# Patient Record
Sex: Male | Born: 1970
Health system: Southern US, Community
[De-identification: ages and names within clinical notes are randomized; demographics above are authoritative.]

## PROBLEM LIST (undated history)

## (undated) DIAGNOSIS — Z9289 Personal history of other medical treatment: Secondary | ICD-10-CM

## (undated) DIAGNOSIS — M1990 Unspecified osteoarthritis, unspecified site: Secondary | ICD-10-CM

## (undated) DIAGNOSIS — F32A Depression, unspecified: Secondary | ICD-10-CM

## (undated) DIAGNOSIS — I10 Essential (primary) hypertension: Secondary | ICD-10-CM

## (undated) DIAGNOSIS — R0600 Dyspnea, unspecified: Secondary | ICD-10-CM

## (undated) DIAGNOSIS — F419 Anxiety disorder, unspecified: Secondary | ICD-10-CM

## (undated) DIAGNOSIS — K219 Gastro-esophageal reflux disease without esophagitis: Secondary | ICD-10-CM

## (undated) DIAGNOSIS — I739 Peripheral vascular disease, unspecified: Secondary | ICD-10-CM

## (undated) DIAGNOSIS — I2510 Atherosclerotic heart disease of native coronary artery without angina pectoris: Secondary | ICD-10-CM

## (undated) DIAGNOSIS — T7840XA Allergy, unspecified, initial encounter: Secondary | ICD-10-CM

## (undated) DIAGNOSIS — G4730 Sleep apnea, unspecified: Secondary | ICD-10-CM

## (undated) DIAGNOSIS — I509 Heart failure, unspecified: Secondary | ICD-10-CM

## (undated) DIAGNOSIS — D649 Anemia, unspecified: Secondary | ICD-10-CM

## (undated) DIAGNOSIS — T8859XA Other complications of anesthesia, initial encounter: Secondary | ICD-10-CM

## (undated) DIAGNOSIS — N189 Chronic kidney disease, unspecified: Secondary | ICD-10-CM

## (undated) DIAGNOSIS — R112 Nausea with vomiting, unspecified: Secondary | ICD-10-CM

## (undated) DIAGNOSIS — F329 Major depressive disorder, single episode, unspecified: Secondary | ICD-10-CM

## (undated) HISTORY — DX: Allergy, unspecified, initial encounter: T78.40XA

## (undated) HISTORY — PX: COLONOSCOPY: SHX174

## (undated) HISTORY — DX: Depression, unspecified: F32.A

## (undated) HISTORY — DX: Major depressive disorder, single episode, unspecified: F32.9

## (undated) HISTORY — DX: Anemia, unspecified: D64.9

## (undated) HISTORY — DX: Anxiety disorder, unspecified: F41.9

## (undated) HISTORY — DX: Sleep apnea, unspecified: G47.30

## (undated) HISTORY — DX: Gastro-esophageal reflux disease without esophagitis: K21.9

## (undated) HISTORY — DX: Chronic kidney disease, unspecified: N18.9

---

## 1999-04-14 ENCOUNTER — Emergency Department (HOSPITAL_COMMUNITY): Admission: EM | Admit: 1999-04-14 | Discharge: 1999-04-14 | Payer: Self-pay | Admitting: Emergency Medicine

## 1999-04-14 ENCOUNTER — Encounter: Payer: Self-pay | Admitting: *Deleted

## 2001-12-02 ENCOUNTER — Ambulatory Visit (HOSPITAL_BASED_OUTPATIENT_CLINIC_OR_DEPARTMENT_OTHER): Admission: RE | Admit: 2001-12-02 | Discharge: 2001-12-02 | Payer: Self-pay

## 2002-06-01 ENCOUNTER — Encounter: Admission: RE | Admit: 2002-06-01 | Discharge: 2002-08-30 | Payer: Self-pay | Admitting: Family Medicine

## 2003-07-04 ENCOUNTER — Encounter: Admission: RE | Admit: 2003-07-04 | Discharge: 2003-10-02 | Payer: Self-pay | Admitting: Family Medicine

## 2009-06-19 ENCOUNTER — Emergency Department (HOSPITAL_COMMUNITY): Admission: EM | Admit: 2009-06-19 | Discharge: 2009-06-19 | Payer: Self-pay | Admitting: Emergency Medicine

## 2009-08-22 ENCOUNTER — Emergency Department (HOSPITAL_COMMUNITY): Admission: EM | Admit: 2009-08-22 | Discharge: 2009-08-22 | Payer: Self-pay | Admitting: Emergency Medicine

## 2010-11-04 LAB — GLUCOSE, CAPILLARY: Glucose-Capillary: 174 mg/dL — ABNORMAL HIGH (ref 70–99)

## 2011-03-03 ENCOUNTER — Inpatient Hospital Stay (INDEPENDENT_AMBULATORY_CARE_PROVIDER_SITE_OTHER)
Admission: RE | Admit: 2011-03-03 | Discharge: 2011-03-03 | Disposition: A | Discharge status: Home / Self Care | Payer: 59 | Source: Ambulatory Visit | Attending: Family Medicine | Admitting: Family Medicine

## 2011-03-03 DIAGNOSIS — L0211 Cutaneous abscess of neck: Secondary | ICD-10-CM

## 2011-03-05 ENCOUNTER — Inpatient Hospital Stay (INDEPENDENT_AMBULATORY_CARE_PROVIDER_SITE_OTHER)
Admission: RE | Admit: 2011-03-05 | Discharge: 2011-03-05 | Disposition: A | Discharge status: Home / Self Care | Payer: 59 | Source: Ambulatory Visit | Attending: Family Medicine | Admitting: Family Medicine

## 2011-03-05 DIAGNOSIS — L0211 Cutaneous abscess of neck: Secondary | ICD-10-CM

## 2011-03-06 LAB — CULTURE, ROUTINE-ABSCESS: Gram Stain: NONE SEEN

## 2011-05-17 ENCOUNTER — Other Ambulatory Visit: Payer: Self-pay | Admitting: Family Medicine

## 2011-05-17 ENCOUNTER — Ambulatory Visit
Admission: RE | Admit: 2011-05-17 | Discharge: 2011-05-17 | Disposition: A | Discharge status: Home / Self Care | Payer: 59 | Source: Ambulatory Visit | Attending: Family Medicine | Admitting: Family Medicine

## 2011-05-17 DIAGNOSIS — M25569 Pain in unspecified knee: Secondary | ICD-10-CM

## 2011-05-17 DIAGNOSIS — Z87891 Personal history of nicotine dependence: Secondary | ICD-10-CM

## 2011-08-12 ENCOUNTER — Emergency Department (HOSPITAL_COMMUNITY): Payer: 59 | Admitting: Anesthesiology

## 2011-08-12 ENCOUNTER — Emergency Department (HOSPITAL_COMMUNITY): Payer: 59

## 2011-08-12 ENCOUNTER — Encounter (HOSPITAL_COMMUNITY): Payer: Self-pay | Admitting: Anesthesiology

## 2011-08-12 ENCOUNTER — Encounter (HOSPITAL_COMMUNITY): Payer: Self-pay | Admitting: *Deleted

## 2011-08-12 ENCOUNTER — Encounter: Payer: Self-pay | Admitting: Emergency Medicine

## 2011-08-12 ENCOUNTER — Inpatient Hospital Stay (HOSPITAL_COMMUNITY): Payer: 59

## 2011-08-12 ENCOUNTER — Other Ambulatory Visit: Payer: Self-pay | Admitting: Urology

## 2011-08-12 ENCOUNTER — Inpatient Hospital Stay (HOSPITAL_COMMUNITY)
Admission: EM | Admit: 2011-08-12 | Discharge: 2011-08-20 | DRG: 729 | Disposition: A | Discharge status: Home Health | Payer: 59 | Attending: Family Medicine | Admitting: Family Medicine

## 2011-08-12 ENCOUNTER — Emergency Department (HOSPITAL_COMMUNITY)
Admission: EM | Admit: 2011-08-12 | Discharge: 2011-08-12 | Disposition: A | Discharge status: Nursing Home (Includes ICF) | Payer: 59 | Source: Home / Self Care

## 2011-08-12 ENCOUNTER — Encounter (HOSPITAL_COMMUNITY)
Admission: EM | Disposition: A | Discharge status: Home Health | Payer: Self-pay | Source: Home / Self Care | Attending: Pulmonary Disease

## 2011-08-12 DIAGNOSIS — D72829 Elevated white blood cell count, unspecified: Secondary | ICD-10-CM | POA: Diagnosis present

## 2011-08-12 DIAGNOSIS — I10 Essential (primary) hypertension: Secondary | ICD-10-CM | POA: Diagnosis present

## 2011-08-12 DIAGNOSIS — E119 Type 2 diabetes mellitus without complications: Secondary | ICD-10-CM | POA: Diagnosis present

## 2011-08-12 DIAGNOSIS — N492 Inflammatory disorders of scrotum: Secondary | ICD-10-CM

## 2011-08-12 DIAGNOSIS — M726 Necrotizing fasciitis: Secondary | ICD-10-CM

## 2011-08-12 DIAGNOSIS — R197 Diarrhea, unspecified: Secondary | ICD-10-CM | POA: Diagnosis present

## 2011-08-12 DIAGNOSIS — J9600 Acute respiratory failure, unspecified whether with hypoxia or hypercapnia: Secondary | ICD-10-CM | POA: Diagnosis present

## 2011-08-12 DIAGNOSIS — N179 Acute kidney failure, unspecified: Secondary | ICD-10-CM | POA: Diagnosis present

## 2011-08-12 DIAGNOSIS — N501 Vascular disorders of male genital organs: Principal | ICD-10-CM | POA: Diagnosis present

## 2011-08-12 DIAGNOSIS — N498 Inflammatory disorders of other specified male genital organs: Secondary | ICD-10-CM | POA: Diagnosis present

## 2011-08-12 DIAGNOSIS — R110 Nausea: Secondary | ICD-10-CM | POA: Diagnosis present

## 2011-08-12 DIAGNOSIS — N493 Fournier gangrene: Secondary | ICD-10-CM | POA: Diagnosis present

## 2011-08-12 DIAGNOSIS — A419 Sepsis, unspecified organism: Secondary | ICD-10-CM | POA: Diagnosis present

## 2011-08-12 DIAGNOSIS — L02219 Cutaneous abscess of trunk, unspecified: Secondary | ICD-10-CM | POA: Diagnosis present

## 2011-08-12 DIAGNOSIS — F17200 Nicotine dependence, unspecified, uncomplicated: Secondary | ICD-10-CM | POA: Diagnosis present

## 2011-08-12 DIAGNOSIS — R6520 Severe sepsis without septic shock: Secondary | ICD-10-CM | POA: Diagnosis present

## 2011-08-12 DIAGNOSIS — T3695XA Adverse effect of unspecified systemic antibiotic, initial encounter: Secondary | ICD-10-CM | POA: Diagnosis present

## 2011-08-12 HISTORY — DX: Essential (primary) hypertension: I10

## 2011-08-12 HISTORY — PX: IRRIGATION AND DEBRIDEMENT ABSCESS: SHX5252

## 2011-08-12 HISTORY — PX: INCISION AND DRAINAGE OF WOUND: SHX1803

## 2011-08-12 LAB — POCT I-STAT 3, ART BLOOD GAS (G3+)
Acid-base deficit: 4 mmol/L — ABNORMAL HIGH (ref 0.0–2.0)
Bicarbonate: 20.4 mEq/L (ref 20.0–24.0)
O2 Saturation: 93 %
Patient temperature: 98.4
TCO2: 21 mmol/L (ref 0–100)
pH, Arterial: 7.363 (ref 7.350–7.450)

## 2011-08-12 LAB — URINALYSIS, ROUTINE W REFLEX MICROSCOPIC
Glucose, UA: >1000 mg/dL — AB
Hgb urine dipstick: NEGATIVE
Leukocytes, UA: NEGATIVE
Specific Gravity, Urine: 1.042 — ABNORMAL HIGH (ref 1.005–1.030)
pH: 5.5 (ref 5.0–8.0)

## 2011-08-12 LAB — GLUCOSE, CAPILLARY
Glucose-Capillary: 371 mg/dL — ABNORMAL HIGH (ref 70–99)
Glucose-Capillary: 256 mg/dL — ABNORMAL HIGH (ref 70–99)
Glucose-Capillary: 262 mg/dL — ABNORMAL HIGH (ref 70–99)
Glucose-Capillary: 248 mg/dL — ABNORMAL HIGH (ref 70–99)
Glucose-Capillary: 263 mg/dL — ABNORMAL HIGH (ref 70–99)
Glucose-Capillary: 173 mg/dL — ABNORMAL HIGH (ref 70–99)

## 2011-08-12 LAB — DIFFERENTIAL
Basophils Relative: 0 % (ref 0–1)
Eosinophils Absolute: 0 10*3/uL (ref 0.0–0.7)
Eosinophils Relative: 0 % (ref 0–5)
Lymphocytes Relative: 3 % — ABNORMAL LOW (ref 12–46)
Neutro Abs: 17.4 10*3/uL — ABNORMAL HIGH (ref 1.7–7.7)

## 2011-08-12 LAB — CBC
HCT: 43.3 % (ref 39.0–52.0)
Hemoglobin: 15.3 g/dL (ref 13.0–17.0)
MCHC: 35.3 g/dL (ref 30.0–36.0)
RBC: 5.18 MIL/uL (ref 4.22–5.81)

## 2011-08-12 LAB — BASIC METABOLIC PANEL
CO2: 20 mEq/L (ref 19–32)
Chloride: 95 mEq/L — ABNORMAL LOW (ref 96–112)
Glucose, Bld: 359 mg/dL — ABNORMAL HIGH (ref 70–99)
Potassium: 3.8 mEq/L (ref 3.5–5.1)
Sodium: 132 mEq/L — ABNORMAL LOW (ref 135–145)

## 2011-08-12 LAB — LACTIC ACID, PLASMA: Lactic Acid, Venous: 2.6 mmol/L — ABNORMAL HIGH (ref 0.5–2.2)

## 2011-08-12 LAB — APTT: aPTT: 30 seconds (ref 24–37)

## 2011-08-12 LAB — PROCALCITONIN: Procalcitonin: 4.9 ng/mL

## 2011-08-12 SURGERY — IRRIGATION AND DEBRIDEMENT ABSCESS
Anesthesia: General | Site: Perineum

## 2011-08-12 MED ORDER — SODIUM CHLORIDE 0.9 % IV SOLN
1.0000 mg/h | INTRAVENOUS | Status: DC
  Administered 2011-08-13: 2 mg/h via INTRAVENOUS
  Administered 2011-08-14: 1 mg/h via INTRAVENOUS
  Filled 2011-08-12 (×5): qty 10

## 2011-08-12 MED ORDER — INSULIN ASPART 100 UNIT/ML ~~LOC~~ SOLN
SUBCUTANEOUS | Status: DC | PRN
  Administered 2011-08-12: 7 [IU] via INTRAVENOUS

## 2011-08-12 MED ORDER — SODIUM CHLORIDE 0.9 % IR SOLN
Status: DC | PRN
  Administered 2011-08-12: 3000 mL

## 2011-08-12 MED ORDER — FENTANYL CITRATE 0.05 MG/ML IJ SOLN
INTRAMUSCULAR | Status: DC | PRN
  Administered 2011-08-12: 100 ug via INTRAVENOUS
  Administered 2011-08-12: 150 ug via INTRAVENOUS
  Administered 2011-08-12: 250 ug via INTRAVENOUS

## 2011-08-12 MED ORDER — PANTOPRAZOLE SODIUM 40 MG IV SOLR
40.0000 mg | INTRAVENOUS | Status: DC
  Administered 2011-08-12 – 2011-08-15 (×4): 40 mg via INTRAVENOUS
  Filled 2011-08-12 (×6): qty 40

## 2011-08-12 MED ORDER — PROPOFOL 10 MG/ML IV EMUL
5.0000 ug/kg/min | INTRAVENOUS | Status: DC
  Administered 2011-08-12: 50 ug/kg/min via INTRAVENOUS

## 2011-08-12 MED ORDER — INSULIN ASPART 100 UNIT/ML ~~LOC~~ SOLN
1.0000 [IU] | SUBCUTANEOUS | Status: DC | PRN
  Filled 2011-08-12: qty 3

## 2011-08-12 MED ORDER — SODIUM CHLORIDE 0.9 % IV BOLUS (SEPSIS)
500.0000 mL | Freq: Once | INTRAVENOUS | Status: AC
  Administered 2011-08-12: 500 mL via INTRAVENOUS

## 2011-08-12 MED ORDER — ONDANSETRON HCL 4 MG/2ML IJ SOLN: 4.0000 mg | Freq: Once | INTRAMUSCULAR | Status: DC | PRN

## 2011-08-12 MED ORDER — CLINDAMYCIN PHOSPHATE 900 MG/50ML IV SOLN
900.0000 mg | Freq: Three times a day (TID) | INTRAVENOUS | Status: DC
  Administered 2011-08-12 – 2011-08-20 (×22): 900 mg via INTRAVENOUS
  Filled 2011-08-12 (×25): qty 50

## 2011-08-12 MED ORDER — VANCOMYCIN HCL IN DEXTROSE 1-5 GM/200ML-% IV SOLN
1000.0000 mg | Freq: Once | INTRAVENOUS | Status: AC
  Administered 2011-08-12: 1000 mg via INTRAVENOUS
  Filled 2011-08-12: qty 200

## 2011-08-12 MED ORDER — PROPOFOL 10 MG/ML IV EMUL
INTRAVENOUS | Status: DC | PRN
  Administered 2011-08-12: 300 mg via INTRAVENOUS

## 2011-08-12 MED ORDER — PROPOFOL 10 MG/ML IV EMUL
INTRAVENOUS | Status: DC | PRN
  Administered 2011-08-12: 100 ug/kg/min via INTRAVENOUS

## 2011-08-12 MED ORDER — VANCOMYCIN HCL IN DEXTROSE 1-5 GM/200ML-% IV SOLN
1000.0000 mg | Freq: Two times a day (BID) | INTRAVENOUS | Status: DC
  Administered 2011-08-13 – 2011-08-14 (×3): 1000 mg via INTRAVENOUS
  Filled 2011-08-12 (×5): qty 200

## 2011-08-12 MED ORDER — ROCURONIUM BROMIDE 100 MG/10ML IV SOLN
INTRAVENOUS | Status: DC | PRN
  Administered 2011-08-12: 50 mg via INTRAVENOUS

## 2011-08-12 MED ORDER — MORPHINE SULFATE 10 MG/ML IJ SOLN
INTRAMUSCULAR | Status: DC | PRN
  Administered 2011-08-12: 10 mg via INTRAVENOUS

## 2011-08-12 MED ORDER — SODIUM CHLORIDE 0.9 % IR SOLN
Status: DC | PRN
  Administered 2011-08-12: 16:00:00

## 2011-08-12 MED ORDER — MORPHINE SULFATE 4 MG/ML IJ SOLN
4.0000 mg | Freq: Once | INTRAMUSCULAR | Status: AC
  Administered 2011-08-12: 4 mg via INTRAVENOUS
  Filled 2011-08-12: qty 1

## 2011-08-12 MED ORDER — PIPERACILLIN-TAZOBACTAM 3.375 G IVPB
3.3750 g | Freq: Once | INTRAVENOUS | Status: AC
  Administered 2011-08-12: 3.375 g via INTRAVENOUS
  Filled 2011-08-12: qty 50

## 2011-08-12 MED ORDER — 0.9 % SODIUM CHLORIDE (POUR BTL) OPTIME
TOPICAL | Status: DC | PRN
  Administered 2011-08-12: 1000 mL

## 2011-08-12 MED ORDER — ACETAMINOPHEN 325 MG PO TABS
650.0000 mg | ORAL_TABLET | Freq: Once | ORAL | Status: AC
  Administered 2011-08-12: 650 mg via ORAL
  Filled 2011-08-12: qty 2

## 2011-08-12 MED ORDER — HYDROMORPHONE HCL PF 1 MG/ML IJ SOLN: 0.2500 mg | INTRAMUSCULAR | Status: DC | PRN

## 2011-08-12 MED ORDER — PIPERACILLIN-TAZOBACTAM 3.375 G IVPB
3.3750 g | Freq: Three times a day (TID) | INTRAVENOUS | Status: DC
  Administered 2011-08-12 – 2011-08-16 (×11): 3.375 g via INTRAVENOUS
  Filled 2011-08-12 (×13): qty 50

## 2011-08-12 MED ORDER — CLINDAMYCIN PHOSPHATE 900 MG/50ML IV SOLN
900.0000 mg | Freq: Four times a day (QID) | INTRAVENOUS | Status: DC
  Filled 2011-08-12 (×3): qty 50

## 2011-08-12 MED ORDER — ONDANSETRON HCL 4 MG/2ML IJ SOLN
4.0000 mg | Freq: Once | INTRAMUSCULAR | Status: AC
  Administered 2011-08-12: 4 mg via INTRAVENOUS
  Filled 2011-08-12: qty 2

## 2011-08-12 MED ORDER — ACETAMINOPHEN 160 MG/5ML PO SOLN: 650.0000 mg | Freq: Four times a day (QID) | ORAL | Status: DC | PRN

## 2011-08-12 MED ORDER — PHENYLEPHRINE HCL 10 MG/ML IJ SOLN
INTRAMUSCULAR | Status: DC | PRN
  Administered 2011-08-12: 80 ug via INTRAVENOUS
  Administered 2011-08-12: 40 ug via INTRAVENOUS
  Administered 2011-08-12: 80 ug via INTRAVENOUS

## 2011-08-12 MED ORDER — SODIUM CHLORIDE 0.9 % IV SOLN
25.0000 ug/h | INTRAVENOUS | Status: DC
  Administered 2011-08-12 – 2011-08-14 (×3): 100 ug/h via INTRAVENOUS
  Filled 2011-08-12 (×6): qty 50

## 2011-08-12 MED ORDER — SODIUM CHLORIDE 0.9 % IV SOLN
100.0000 [IU] | INTRAVENOUS | Status: DC | PRN
  Administered 2011-08-12: 3 [IU]/h via INTRAVENOUS

## 2011-08-12 MED ORDER — VANCOMYCIN HCL IN DEXTROSE 1-5 GM/200ML-% IV SOLN
1000.0000 mg | Freq: Two times a day (BID) | INTRAVENOUS | Status: DC
  Administered 2011-08-12: 1000 mg via INTRAVENOUS
  Filled 2011-08-12 (×2): qty 200

## 2011-08-12 MED ORDER — PIPERACILLIN-TAZOBACTAM 3.375 G IVPB
3.3750 g | Freq: Four times a day (QID) | INTRAVENOUS | Status: DC
  Filled 2011-08-12 (×3): qty 50

## 2011-08-12 MED ORDER — ACETAMINOPHEN 325 MG PO TABS
ORAL_TABLET | ORAL | Status: AC
  Filled 2011-08-12: qty 2

## 2011-08-12 MED ORDER — MIDAZOLAM HCL 5 MG/5ML IJ SOLN
INTRAMUSCULAR | Status: DC | PRN
  Administered 2011-08-12 (×2): 2 mg via INTRAVENOUS

## 2011-08-12 MED ORDER — VECURONIUM BROMIDE 10 MG IV SOLR
INTRAVENOUS | Status: DC | PRN
  Administered 2011-08-12 (×2): 4 mg via INTRAVENOUS
  Administered 2011-08-12: 2 mg via INTRAVENOUS

## 2011-08-12 MED ORDER — CHLORHEXIDINE GLUCONATE 0.12 % MT SOLN
15.0000 mL | Freq: Two times a day (BID) | OROMUCOSAL | Status: DC
  Administered 2011-08-12 – 2011-08-15 (×6): 15 mL via OROMUCOSAL
  Filled 2011-08-12 (×6): qty 15

## 2011-08-12 MED ORDER — SODIUM CHLORIDE 0.9 % IV SOLN
INTRAVENOUS | Status: DC
  Administered 2011-08-12 – 2011-08-15 (×4): via INTRAVENOUS

## 2011-08-12 MED ORDER — PROPOFOL 10 MG/ML IV EMUL
5.0000 ug/kg/min | INTRAVENOUS | Status: DC
  Administered 2011-08-12: 40 ug/kg/min via INTRAVENOUS
  Filled 2011-08-12: qty 100

## 2011-08-12 MED ORDER — PROPOFOL 10 MG/ML IV EMUL
INTRAVENOUS | Status: AC
  Administered 2011-08-12: 18:00:00
  Filled 2011-08-12: qty 100

## 2011-08-12 MED ORDER — HETASTARCH-ELECTROLYTES 6 % IV SOLN
INTRAVENOUS | Status: DC | PRN
  Administered 2011-08-12 (×2): via INTRAVENOUS

## 2011-08-12 MED ORDER — BIOTENE DRY MOUTH MT LIQD
1.0000 "application " | Freq: Four times a day (QID) | OROMUCOSAL | Status: DC
  Administered 2011-08-13 – 2011-08-15 (×11): 15 mL via OROMUCOSAL

## 2011-08-12 MED ORDER — SODIUM CHLORIDE 0.9 % IV SOLN
INTRAVENOUS | Status: DC | PRN
  Administered 2011-08-12 (×3): via INTRAVENOUS

## 2011-08-12 MED ORDER — INSULIN REGULAR HUMAN 100 UNIT/ML IJ SOLN
INTRAMUSCULAR | Status: DC
  Administered 2011-08-12: 3 [IU]/h via INTRAVENOUS
  Filled 2011-08-12: qty 1

## 2011-08-12 MED ORDER — ACETAMINOPHEN 325 MG PO TABS
650.0000 mg | ORAL_TABLET | Freq: Once | ORAL | Status: AC
  Administered 2011-08-12: 650 mg via ORAL

## 2011-08-12 SURGICAL SUPPLY — 39 items
BAG URINE DRAINAGE (UROLOGICAL SUPPLIES) ×1 IMPLANT
BANDAGE GAUZE ELAST BULKY 4 IN (GAUZE/BANDAGES/DRESSINGS) ×2 IMPLANT
BLADE SURG 10 STRL SS (BLADE) ×1 IMPLANT
BLADE SURG 15 STRL LF DISP TIS (BLADE) IMPLANT
BLADE SURG 15 STRL SS (BLADE) ×3
CATH FOLEY 2WAY SLVR  5CC 18FR (CATHETERS) ×1
CATH FOLEY 2WAY SLVR 5CC 18FR (CATHETERS) IMPLANT
CONT SPECI 4OZ STER CLIK (MISCELLANEOUS) ×1 IMPLANT
COVER SURGICAL LIGHT HANDLE (MISCELLANEOUS) ×1 IMPLANT
DRSG PAD ABDOMINAL 8X10 ST (GAUZE/BANDAGES/DRESSINGS) ×1 IMPLANT
GAUZE KERLIX 2  STERILE LF (GAUZE/BANDAGES/DRESSINGS) ×1 IMPLANT
GAUZE VASELINE 3X9 (GAUZE/BANDAGES/DRESSINGS) ×1 IMPLANT
GLOVE BIO SURGEON STRL SZ7 (GLOVE) ×3 IMPLANT
GLOVE BIO SURGEON STRL SZ8 (GLOVE) ×1 IMPLANT
GLOVE BIOGEL PI IND STRL 6.5 (GLOVE) IMPLANT
GLOVE BIOGEL PI IND STRL 7.5 (GLOVE) IMPLANT
GLOVE BIOGEL PI IND STRL 8 (GLOVE) IMPLANT
GLOVE BIOGEL PI INDICATOR 6.5 (GLOVE) ×2
GLOVE BIOGEL PI INDICATOR 7.5 (GLOVE) ×2
GLOVE BIOGEL PI INDICATOR 8 (GLOVE) ×2
GOWN STRL NON-REIN LRG LVL3 (GOWN DISPOSABLE) ×3 IMPLANT
HANDPIECE INTERPULSE COAX TIP (DISPOSABLE) ×3
KIT BASIN OR (CUSTOM PROCEDURE TRAY) ×1 IMPLANT
MANIFOLD NEPTUNE II (INSTRUMENTS) ×1 IMPLANT
NS IRRIG 1000ML POUR BTL (IV SOLUTION) ×1 IMPLANT
PACK LITHOTOMY IV (CUSTOM PROCEDURE TRAY) ×1 IMPLANT
PAD ARMBOARD 7.5X6 YLW CONV (MISCELLANEOUS) ×1 IMPLANT
SET HNDPC FAN SPRY TIP SCT (DISPOSABLE) IMPLANT
SPONGE LAP 18X18 X RAY DECT (DISPOSABLE) ×2 IMPLANT
SUT CHROMIC 3 0 SH 27 (SUTURE) ×2 IMPLANT
SUT ETHILON 2 0 FS 18 (SUTURE) ×1 IMPLANT
SUT SILK 2 0 SH (SUTURE) ×1 IMPLANT
SWAB COLLECTION DEVICE MRSA (MISCELLANEOUS) ×1 IMPLANT
SYR 20CC LL (SYRINGE) ×1 IMPLANT
SYR BULB IRRIGATION 50ML (SYRINGE) ×1 IMPLANT
TOWEL OR 17X24 6PK STRL BLUE (TOWEL DISPOSABLE) ×1 IMPLANT
TOWEL OR 17X26 10 PK STRL BLUE (TOWEL DISPOSABLE) ×1 IMPLANT
TUBE ANAEROBIC SPECIMEN COL (MISCELLANEOUS) ×1 IMPLANT
TUBE CONNECTING 12X1/4 (SUCTIONS) ×1 IMPLANT

## 2011-08-12 NOTE — Op Note (Signed)
08/12/2011  4:54 PM  PATIENT:  Edward [name]  40 y.o. male  PRE-OPERATIVE DIAGNOSIS:  gangrene perineum  POST-OPERATIVE DIAGNOSIS:  gangrene perineum  PROCEDURE:  Procedure(s): IRRIGATION AND DEBRIDEMENT ABSCESS IRRIGATION AND DEBRIDEMENT WOUND  SURGEON:  Surgeon(s): Molli Hazard, MD Zenovia Jarred, MD  PHYSICIAN ASSISTANT:   ASSISTANTS: none   ANESTHESIA:   general  EBL:  Total I/O In: [id number] [I.V.:1600; IV Piggyback:1000] Out: 400 [Other:250; Blood:150]  BLOOD ADMINISTERED:none  DRAINS: none   SPECIMEN:  Excision  DISPOSITION OF SPECIMEN:  PATHOLOGY  COUNTS:  YES  DICTATION: .Dragon Dictation I was asked to see this patient has an intraoperative consult. Dr. Jasmine December has completed his scrotum due to infection. He found some necrotic tissue of the perineum which she asked for [place] to evaluate. The patient is under general anesthesia and prepped and draped at the completion of Dr. Jasmine December procedure. I inspected the tissues of the perineum beneath the skin there were some necrotic tissue there. This was debridement away. It was mostly superficial. Some more of the perineal skin was also removed. All back to viable tissue. Once all necrotic tissue was removed. Hemostasis was obtained with Bovie cautery. An endorectal exam digitally. No connection to the rectum was noted. And there was a good distance away. The wound was irrigated. Hemostasis was ensured. Sterile Kerlix soaked in saline gauze dressing was applied per Dr. Jasmine December instructions. We will follow the patient along with the CCM team and urology. We will plan to relook in 24-48 hours.  PATIENT DISPOSITION:  PACU - hemodynamically stable.   Delay start of Pharmacological VTE agent (>24hrs) due to surgical blood loss or risk of bleeding:  no  Alf Doyle E 12/24/20124:54 PM

## 2011-08-12 NOTE — ED Provider Notes (Signed)
Medical screening examination/treatment/procedure(s) were performed by non-physician practitioner and as supervising physician I was immediately available for consultation/collaboration.  Burnett Kanaris, MD 08/12/11 1359

## 2011-08-12 NOTE — Anesthesia Procedure Notes (Addendum)
Procedure Name: Intubation Date/Time: 08/12/2011 3:13 PM Performed by: Arnaldo Natal Pre-anesthesia Checklist: Patient identified, Emergency Drugs available, Suction available and Patient being monitored Patient Re-evaluated:Patient Re-evaluated prior to inductionOxygen Delivery Method: Circle System Utilized Preoxygenation: Pre-oxygenation with 100% oxygen Intubation Type: IV induction Ventilation: Mask ventilation without difficulty Laryngoscope Size: Mac and 3 Grade View: Grade I Tube type: Oral Tube size: 8.0 mm Number of attempts: 1 Airway Equipment and Method: stylet Secured at: 24 cm Tube secured with: Tape Dental Injury: Teeth and Oropharynx as per pre-operative assessment

## 2011-08-12 NOTE — Transfer of Care (Deleted)
Immediate Anesthesia Transfer of Care Note  Patient: Edward [name]  Procedure(s) Performed:  IRRIGATION AND DEBRIDEMENT ABSCESS - irrigation and debridment perineum  ; IRRIGATION AND DEBRIDEMENT WOUND - perineum  Patient Location: PACU  Anesthesia Type: General  Level of Consciousness: awake, sedated and patient cooperative  Airway & Oxygen Therapy: Patient Spontanous Breathing and Patient connected to nasal cannula oxygen  Post-op Assessment: Report given to PACU RN and Post -op Vital signs reviewed and stable  Post vital signs: stable  Complications: No apparent anesthesia complications

## 2011-08-12 NOTE — ED Notes (Signed)
Pt reports taking two viagra Friday night.

## 2011-08-12 NOTE — Anesthesia Postprocedure Evaluation (Signed)
  Anesthesia Post-op Note  Patient: Edward [name]  Procedure(s) Performed:  IRRIGATION AND DEBRIDEMENT ABSCESS - irrigation and debridment perineum  ; IRRIGATION AND DEBRIDEMENT WOUND - perineum  Patient Location: SICU  Anesthesia Type: General  Level of Consciousness: sedated and unresponsive  Airway and Oxygen Therapy: Patient remains intubated per anesthesia plan and Patient placed on Ventilator (see vital sign flow sheet for setting)  Post-op Pain: none  Post-op Assessment: Post-op Vital signs reviewed, Patient's Cardiovascular Status Stable, Respiratory Function Stable, Patent Airway, No signs of Nausea or vomiting and Pain level controlled  Post-op Vital Signs: stable  Complications: No apparent anesthesia complications

## 2011-08-12 NOTE — ED Notes (Signed)
Verified with pharmacy that insulin is compatible with vancomycin

## 2011-08-12 NOTE — H&P (Signed)
Name: Edward [name] MRN: [id number] DOB: [birth year]    LOS: 0  PCCM ADMISSION NOTE  History of Present Illness: 40 year old male with history of diabetes. Reported to the ER today with increased pain in his perineum and fever. Fever started early this morning. No nausea or emesis. Work-up reveals leukocytosis and crepitus in the perineum.  Urology saw patient and decided to take to the OR for debridement    Lines / Drains: PIV ET tube 12/24>>>  Cultures: Blood 12/24>>> Sputum 12/24>>> Urine 12/24>>> Wound 12/24>>>  Antibiotics: Clinda 12/24>>> Zosyn 12/24>>> Vancomycin 12/24>>>  Tests / Events: 12/24 to OR for debridement.  Past Medical History  Diagnosis Date  . Diabetes mellitus   . Hypertension    History reviewed. No pertinent past surgical history. Prior to Admission medications   Medication Sig Start Date End Date Taking? Authorizing Provider  ibuprofen (ADVIL,MOTRIN) 200 MG tablet Take 600 mg by mouth every 6 (six) hours as needed. For pain   Yes Historical Provider, MD  lisinopril (PRINIVIL,ZESTRIL) 20 MG tablet Take 20 mg by mouth daily.     Yes Historical Provider, MD  metFORMIN (GLUCOPHAGE) 850 MG tablet Take 850 mg by mouth 2 (two) times daily with a meal. Patient is poor historian related to medication    Yes Historical Provider, MD    Allergies No Known Allergies  Family History History reviewed. No pertinent family history.  Social History  reports that he has been smoking.  He does not have any smokeless tobacco history on file. He reports that he drinks alcohol. His drug history not on file.  Review Of Systems  11 points review of systems is negative with an exception of listed in HPI.  Vital Signs: Filed Vitals:   08/12/11 1335  BP: 105/53  Pulse: 124  Temp:   Resp: 20   No intake or output data in the 24 hours ending 08/12/11 1355  Physical Examination: General:  Well appearing, in mild painful distress. Neuro:  Alert and oriented prior  to intubation, now intubated and sedated.   HEENT:  [place]/AT, PERRL, EOM-I and moist mucous membranes. Neck:  Supple, -LAN and -thyromegally.   Cardiovascular:  RRR, Nl S1/S2, -M/R/G. Lungs:  CTA bilaterally. Abdomen:  Soft, tender suprapubically, ND and +BS. Musculoskeletal:  -edema and -tenderness. Skin:  WNL  Labs and Imaging:   CBC    Component Value Date/Time   WBC 19.8* 08/12/2011 1044   RBC 5.18 08/12/2011 1044   HGB 15.3 08/12/2011 1044   HCT 43.3 08/12/2011 1044   PLT 222 08/12/2011 1044   MCV 83.6 08/12/2011 1044   MCH 29.5 08/12/2011 1044   MCHC 35.3 08/12/2011 1044   RDW 12.3 08/12/2011 1044   LYMPHSABS 0.6* 08/12/2011 1044   MONOABS 1.8* 08/12/2011 1044   EOSABS 0.0 08/12/2011 1044   BASOSABS 0.0 08/12/2011 1044    BMET    Component Value Date/Time   NA 132* 08/12/2011 1044   K 3.8 08/12/2011 1044   CL 95* 08/12/2011 1044   CO2 20 08/12/2011 1044   GLUCOSE 359* 08/12/2011 1044   BUN 14 08/12/2011 1044   CREATININE 1.08 08/12/2011 1044   CALCIUM 9.2 08/12/2011 1044   GFRNONAA 84* 08/12/2011 1044   GFRAA >90 08/12/2011 1044    @cmet @ ABG No results found for this basename: phart, pco2, pco2art, po2, po2art, hco3, tco2, acidbasedef, o2sat    No results found for this basename: MG in the last 168 hours Lab Results  Component Value Date  CALCIUM 9.2 08/12/2011    Assessment and Plan: 33 M with fournier's gangrene, urology evaluated the patient and called PCCM to admit as they will keep the patient sedated and intubated post procedure since he will likely numerous OR visits.  Patient went to OR, testicles placed in thigh and will need reconstructive surgery once more stable.  Vital signs are stable right now. Plan: Maintain sedated and intubated.  Urology to guide as to when the patient is going to be ready for weaning trials and extubation.  IVF to maintain BP.  Will not start TF til AM.  Pan cultures.  Vanc/Zosyn/Clinda.  Narrow as cultures are  available.  If BP drops will need TLC and pressors with sepsis protocol but appears stable for now.  AM Labs ordered.  ICU hyperglycemia protocol for tight BS control with active infection.  Add clinda for toxin inhibition.  F/U with urology.  Tylenol for fever control.  Vent settings ordered.  F/U ABG.  The patient is critically ill with multiple organ systems failure and requires high complexity decision making for assessment and support, frequent evaluation and titration of therapies, application of advanced monitoring technologies and extensive interpretation of multiple databases. Critical Care Time devoted to patient care services described in this note is 55 minutes.  Jennet Maduro, M.D. Pulmonary and [place] [phone]  08/12/2011, 1:55 PM

## 2011-08-12 NOTE — ED Provider Notes (Signed)
History     CSN: [id number]  Arrival date & time 08/12/11  [id number]   None     Chief Complaint  Patient presents with  . Groin Swelling    (Consider location/radiation/quality/duration/timing/severity/associated sxs/prior treatment) HPI Comments: Pt presents with c/o pain and swelling in his genital area. He states it initially began 3 days ago with a tender bump in the perineum area and has progressively spread since and is in his scrotum also. It is very painful.  He developed chills last night but did not check his temp at home. He has a hx of DM but is not checking his BS regularly and states when he last checked over 2 weeks ago was running in the 200s. He has a hx of abscesses, but upon review of culture results no hx of MRSA.    Past Medical History  Diagnosis Date  . Diabetes mellitus   . Hypertension     History reviewed. No pertinent past surgical history.  No family history on file.  History  Substance Use Topics  . Smoking status: Current Everyday Smoker  . Smokeless tobacco: Not on file  . Alcohol Use: Yes      Review of Systems  Constitutional: Positive for fever, chills and appetite change.  Genitourinary: Positive for scrotal swelling and testicular pain. Negative for dysuria, frequency, flank pain and penile pain.    Allergies  Review of patient's allergies indicates no known allergies.  Home Medications   Current Outpatient Rx  Name Route Sig Dispense Refill  . IBUPROFEN 200 MG PO TABS Oral Take 200 mg by mouth every 6 (six) hours as needed.      Marland Kitchen LISINOPRIL 20 MG PO TABS Oral Take 20 mg by mouth daily.      Marland Kitchen METFORMIN HCL 850 MG PO TABS Oral Take 850 mg by mouth 2 (two) times daily with a meal. Patient is poor historian related to medication       BP 93/63  Pulse 126  Temp(Src) 101 F (38.3 C) (Oral)  Resp 20  SpO2 97%  Physical Exam  Nursing note and vitals reviewed. Constitutional: He appears well-developed and well-nourished. He  appears distressed (pt appears very uncomfortable).  HENT:  Head: Normocephalic and atraumatic.  Cardiovascular: Normal rate, regular rhythm and normal heart sounds.   Pulmonary/Chest: Effort normal and breath sounds normal. No respiratory distress.  Genitourinary:     Lymphadenopathy:       Right: No inguinal adenopathy present.       Left: No inguinal adenopathy present.  Skin: Skin is warm and dry.  Psychiatric: He has a normal mood and affect.    ED Course  Procedures (including critical care time)  Labs Reviewed - No data to display No results found.   1. Scrotal abscess       MDM  Pt transferred to [facility], [place] 08/12/11 [phone]

## 2011-08-12 NOTE — ED Notes (Signed)
Patient transported to Ultrasound 

## 2011-08-12 NOTE — Progress Notes (Addendum)
ANTIBIOTIC CONSULT NOTE - INITIAL  Pharmacy Consult for Vancomycin, Zosyn, Clindamycin  Indication: Fournier's gangrene  No Known Allergies  Patient Measurements:   Adjusted Body Weight:   Vital Signs: Temp: 99.5 F (37.5 C) (12/24 1213) Temp src: Rectal (12/24 1213) BP: 105/53 mmHg (12/24 1335) Pulse Rate: 124  (12/24 1335) Intake/Output from previous day:   Intake/Output from this shift:    Labs:  Basename 08/12/11 1044  WBC 19.8*  HGB 15.3  PLT 222  LABCREA --  CREATININE 1.08   CrCl is unknown because there is no height on file for the current visit. No results found for this basename: VANCOTROUGH:2,VANCOPEAK:2,VANCORANDOM:2,GENTTROUGH:2,GENTPEAK:2,GENTRANDOM:2,TOBRATROUGH:2,TOBRAPEAK:2,TOBRARND:2,AMIKACINPEAK:2,AMIKACINTROU:2,AMIKACIN:2, in the last 72 hours   Microbiology: No results found for this or any previous visit (from the past 720 hour(s)).  Medical History: Past Medical History  Diagnosis Date  . Diabetes mellitus   . Hypertension      Assessment: 29 yoM w/ PMHx of diabetes and HTN presents w/ 2 day hx swelling and pain in perineum with fevers and chills.  Pt has been seen by Urology and is currently in OR for debridement.     Pharmacy asked to manage patient's antibiotics: Vancomycin, Zosyn, and Clindamycin.  On admission, WBC 19.8, T 101, SCr 1.08, estimated CrCl ~120 ml/min (RN estimates Ht 6ft, Weight 210 lb).  Pt has already received Vancomycin loading dose 1 gram IV x 1 @ 1400 and Zosyn 3.375g IV x 1 @ 1300.  Pt also has clindamycin 900mg  IV q 6 h as a signed and held order.  Usual dose of clindamycin for gangrene is 900mg  IV q 8 h.   Goal of Therapy:  Vancomycin trough 15-20  Plan:  1. Vancomycin loading dose: additional 1 gram IV x 1 to OR 2. Vancomycin maintenance dose: 1 gram IV q 12 h 3. Zosyn 3.375g IV q 8 h (4 hour infusion) 4. Change Clindamycin to 900mg  IV q 8 h 5. Follow up clinical course, WBC, T, renal function, cultures as  obtained.    Angeni Chaudhuri E 08/12/2011,2:28 PM

## 2011-08-12 NOTE — ED Notes (Signed)
Patient reports bump under testicles on Friday night.  Patient reports putting ointment on bump, now reports increase swelling and pain.  Reports size of tennis ball.

## 2011-08-12 NOTE — Brief Op Note (Addendum)
08/12/2011  4:57 PM  PATIENT:  Edward [name]  40 y.o. male  PRE-OPERATIVE DIAGNOSIS:  gangrene perineum  POST-OPERATIVE DIAGNOSIS:  gangrene perineum  PROCEDURE:  Procedure(s): IRRIGATION AND DEBRIDEMENT ABSCESS IRRIGATION AND DEBRIDEMENT PERINEUM AND SCROTUM  SURGEON:  Surgeon(s): Molli Hazard, MD Zenovia Jarred, MD  PHYSICIAN ASSISTANT: None  ASSISTANTS: none   ANESTHESIA:   general  EBL:  Total I/O In: [id number] [I.V.:1600; IV Piggyback:1000] Out: 400 [Other:250; Blood:150]  BLOOD ADMINISTERED:none  DRAINS: Urinary Catheter (Foley)   LOCAL MEDICATIONS USED:  NONE  SPECIMEN:  Source of Specimen:  Perineal wound  DISPOSITION OF SPECIMEN:  Microbiology  COUNTS:  YES  TOURNIQUET:  * No tourniquets in log *  DICTATION: .Other Dictation: Dictation Number [id number]  PLAN OF CARE: Admit to inpatient   PATIENT DISPOSITION:  PACU - hemodynamically stable.   Delay start of Pharmacological VTE agent (>24hrs) due to surgical blood loss or risk of bleeding: No   ADDENDUM: __x_____Excisional Debridement - cutting away necrotic, devitalzed tissue or slough to the level of viable tissue using a sharp instrument (i.e. scalpel, scissors, etc.)  _______Non Excisional Debridement - the removal of necrotic, decitialized tissue or slough by means of scraping, mechanical brushing, flushing, or washing (i.e. irrigation; whirlpool); minor removal of loose fragments  _______Other ________________________________________________________  Instrument Used:  ____x___Scissors _______Scalpel _______Curette _____x__Other _____Electrocautery, pulse-vac___________________________________________________    Depth of Debridement: _______Partial Thickness _______Full Thickness ___x____Skin and Subcutaneous Tissue _______Subcutaneous Tissue and Muscle _______Subcutaneous Tissue, Muscle and Bone _______Other ________________________________________________________    Size  ___Entire scrotum, most of the perineum________________________________________________________

## 2011-08-12 NOTE — Anesthesia Preprocedure Evaluation (Addendum)
Anesthesia Evaluation  Patient identified by MRN, date of birth, ID band Patient awake    Reviewed: Allergy & Precautions, H&P , NPO status , Patient's Chart, lab work & pertinent test results, reviewed documented beta blocker date and time   Airway Mallampati: II TM Distance: <3 FB Neck ROM: Full    Dental  (+) Teeth Intact, Caps and Dental Advisory Given   Pulmonary neg pulmonary ROS,          Cardiovascular hypertension, Regular Tachycardia    Neuro/Psych    GI/Hepatic Neg liver ROS,   Endo/Other  Diabetes mellitus-, Poorly Controlled, Type 2  Renal/GU negative Renal ROS     Musculoskeletal   Abdominal   Peds  Hematology negative hematology ROS (+)   Anesthesia Other Findings   Reproductive/Obstetrics                         Anesthesia Physical Anesthesia Plan  ASA: III  Anesthesia Plan: General and General ETT   Post-op Pain Management:    Induction: Intravenous  Airway Management Planned: Oral ETT  Additional Equipment:   Intra-op Plan:   Post-operative Plan: Possible Post-op intubation/ventilation  Informed Consent: I have reviewed the patients History and Physical, chart, labs and discussed the procedure including the risks, benefits and alternatives for the proposed anesthesia with the patient or authorized representative who has indicated his/her understanding and acceptance.   Dental advisory given  Plan Discussed with: Anesthesiologist, CRNA and Surgeon  Anesthesia Plan Comments:        Anesthesia Quick Evaluation

## 2011-08-12 NOTE — ED Notes (Signed)
Provider notified of patient and complaint

## 2011-08-12 NOTE — Consults (Signed)
Urology Consult  CC: Perineal crepitus  HPI: 40 year old male with history of diabetes.  Reported to the ER today with increased pain in his perineum and fever.  Fever started early this morning.  No nausea or emesis.  Work-up reveals leukocytosis and crepitus in the perineum.  I explained to the patient he likely had gangrene of the perineum and the best treatment was surgical debridement.  We discussed the risks, benefits, alternatives and likelihood of achieving goals.     PMH: Past Medical History  Diagnosis Date  . Diabetes mellitus   . Hypertension     PSH: History reviewed. No pertinent past surgical history.  Allergies: No Known Allergies  Medications: Lisinopril   Social History: History   Social History  . Marital Status: Married    Spouse Name: N/A    Number of Children: N/A  . Years of Education: N/A   Occupational History  . Not on file.   Social History Main Topics  . Smoking status: Current Everyday Smoker  . Smokeless tobacco: Not on file  . Alcohol Use: Yes  . Drug Use:   . Sexually Active:    Other Topics Concern  . Not on file   Social History Narrative  . No narrative on file    Family History: History reviewed. No pertinent family history.  Review of Systems: Positive: Perineal discomfort. Fever. Negative: Chest pain, SOB.  A further 10 point review of systems was negative except what is listed in the HPI.  Physical Exam: Filed Vitals:   08/12/11 1335  BP: 105/53  Pulse: 124  Temp:   Resp: 20   General: No acute distress.  Awake. Head:  Normocephalic.  Atraumatic. ENT:  EOMI.  Mucous membranes moist Neck:  Supple.  No lymphadenopathy. CV:  S1 present. S2 present. Regular rate. Pulmonary: Equal effort bilaterally.  Clear to auscultation bilaterally. Abdomen: Soft.  Non-tender to palpation. Skin:  Normal turgor.  No visible rash. Extremity: No gross deformity of bilateral upper extremities.  No gross deformity of        bilateral lower extremities. Neurologic: Alert. Appropriate mood.  Penis:  No lesions. No crepitus.  Urethra: No Foley catheter in place.  Orthotopic meatus. Scrotum: No lesions.  No ecchymosis.  No erythema. Mild edema.  Positive crepitus. Testicles: Descended bilaterally.  No masses bilaterally. Epididymis: Palpable bilaterally. Mildly Tender to palpation.  Studies:  Recent Labs  [facility] 08/12/11 1044   HGB 15.3   WBC 19.8*   PLT 222    Recent Labs  Basename 08/12/11 1044   NA 132*   K 3.8   CL 95*   CO2 20   BUN 14   CREATININE 1.08   CALCIUM 9.2   GFRNONAA 84*   GFRAA >90     No results found for this basename: PT:2,INR:2,APTT:2 in the last 72 hours   No components found with this basename: ABG:2    Assessment:  Fournier's gangrene  Plan: -To the OR for genital wound debridement. -Will consult critical care medicine for admission. -Informed consent obtained.    Pager: [phone]

## 2011-08-12 NOTE — ED Notes (Signed)
Pt reports this morning when he woke up he had pain starting under his testicles and then the swelling began to increase.  Pt testicles are swollen approximately the size of a tennis ball.

## 2011-08-12 NOTE — ED Notes (Signed)
Sent from ucc for further eval of testicular swelling and pain.

## 2011-08-12 NOTE — ED Provider Notes (Signed)
History     CSN: [id number]  Arrival date & time 08/12/11  1000   First MD Initiated Contact with Patient 08/12/11 1010      Chief Complaint  Patient presents with  . Groin Swelling    (Consider location/radiation/quality/duration/timing/severity/associated sxs/prior treatment) The history is provided by the patient and medical records. The history is limited by the condition of the patient.   level V caveat applies for urgent need for intervention.  the patient is a 40 year old male, with a history of non-insulin-dependent diabetes, and hypertension.  He presents to emergency department complaining of swelling in his scrotal area with fevers and chills.  He does not check his blood sugar fully does not know if it is elevated.  He denies cough, nausea, vomiting, diarrhea, urinary tract symptoms.  He denies penile discharge or dysuria.  He has never had this before.  He has a single sexual partner and has never had an STD in the past.  Past Medical History  Diagnosis Date  . Diabetes mellitus   . Hypertension     History reviewed. No pertinent past surgical history.  History reviewed. No pertinent family history.  History  Substance Use Topics  . Smoking status: Current Everyday Smoker  . Smokeless tobacco: Not on file  . Alcohol Use: Yes      Review of Systems  Constitutional: Positive for fever and chills.  HENT: Negative for congestion.   Eyes: Negative for redness.  Respiratory: Negative for cough and shortness of breath.   Cardiovascular: Negative for chest pain.  Gastrointestinal: Negative for nausea, vomiting, abdominal pain and diarrhea.  Genitourinary: Positive for scrotal swelling and testicular pain. Negative for dysuria and discharge.  Musculoskeletal: Positive for myalgias. Negative for back pain.  Skin: Negative for rash.  Neurological: Positive for weakness. Negative for light-headedness and headaches.  Psychiatric/Behavioral: Negative for confusion.     Allergies  Review of patient's allergies indicates no known allergies.  Home Medications   Current Outpatient Rx  Name Route Sig Dispense Refill  . IBUPROFEN 200 MG PO TABS Oral Take 600 mg by mouth every 6 (six) hours as needed. For pain    . LISINOPRIL 20 MG PO TABS Oral Take 20 mg by mouth daily.      Marland Kitchen METFORMIN HCL 850 MG PO TABS Oral Take 850 mg by mouth 2 (two) times daily with a meal. Patient is poor historian related to medication       BP 110/75  Pulse 114  Temp(Src) 99.7 F (37.6 C) (Oral)  Resp 16  SpO2 98%  Physical Exam  Vitals reviewed. Constitutional: He is oriented to person, place, and time. He appears well-developed and well-nourished. He appears distressed.  HENT:  Head: Normocephalic and atraumatic.  Eyes: EOM are normal. Pupils are equal, round, and reactive to light.  Neck: Normal range of motion. Neck supple.  Cardiovascular: Regular rhythm and normal heart sounds.   No murmur heard.      Tachycardic  Pulmonary/Chest: Effort normal and breath sounds normal. No respiratory distress. He has no wheezes. He has no rales.  Abdominal: Soft. Bowel sounds are normal. He exhibits no distension and no mass. There is no tenderness. There is no rebound and no guarding.  Genitourinary: Penis normal. No penile tenderness.       No penile discharge Left scrotal swelling with tenderness No abscesses noted No skin lesions  Musculoskeletal: Normal range of motion. He exhibits no edema and no tenderness.  Neurological: He is alert and  oriented to person, place, and time. No cranial nerve deficit.  Skin: Skin is warm and dry. He is not diaphoretic.  Psychiatric: He has a normal mood and affect. His behavior is normal.    ED Course  Procedures (including critical care time) 40 year old diabetic male, complains of left scrotal pain and swelling for a few days with fevers and chills.  He is tachycardic.  His temperature is mildly elevated.  He was an oral  temperature, so, I suspect that he is febrile.  My concern is that he has epididymitis, but more importantly, whether he is developing Fournier's gangrene.  We will establish an IV give him IV fluids, treat him with Tylenol and perform blood tests, including blood cultures, and a urinalysis.  I anticipate he will need to be admitted to the hospital.  Labs Reviewed  CBC - Abnormal; Notable for the following:    WBC 19.8 (*)    All other components within normal limits  DIFFERENTIAL - Abnormal; Notable for the following:    Neutrophils Relative 88 (*)    Lymphocytes Relative 3 (*)    Neutro Abs 17.4 (*)    Lymphs Abs 0.6 (*)    Monocytes Absolute 1.8 (*)    All other components within normal limits  BASIC METABOLIC PANEL - Abnormal; Notable for the following:    Sodium 132 (*)    Chloride 95 (*)    Glucose, Bld 359 (*)    GFR calc non Af Amer 84 (*)    All other components within normal limits  LACTIC ACID, PLASMA - Abnormal; Notable for the following:    Lactic Acid, Venous 2.6 (*)    All other components within normal limits  URINALYSIS, ROUTINE W REFLEX MICROSCOPIC  PROCALCITONIN  CULTURE, BLOOD (ROUTINE X 2)  CULTURE, BLOOD (ROUTINE X 2)    1:00 PM I spoke with Dr. Jasmine December, the urologist.  He asked me to call the general surgeon to initiate treatment and he would assist in the operating room.  If necessary. I spoke with Dr. Georganna Skeans.  The general surgeon.  He will come, evaluate the patient in the emergency department for possible Fournier's gangrene. Antibiotics have been initiated.   CRITICAL CARE Performed by: Elmer Picker   Total critical care time: 30 min  Critical care time was exclusive of separately billable procedures and treating other patients.  Critical care was necessary to treat or prevent imminent or life-threatening deterioration.  Critical care was time spent personally by me on the following activities: development of treatment plan with  patient and/or surrogate as well as nursing, discussions with consultants, evaluation of patient's response to treatment, examination of patient, obtaining history from patient or surrogate, ordering and performing treatments and interventions, ordering and review of laboratory studies, ordering and review of radiographic studies, pulse oximetry and re-evaluation of patient's condition.  1:15 PM Spoke with Dr. Grandville Silos.  He agrees with my assessment but feels that since findings confined to scrotum now, urology could take care of this by themselves.  MDM  Scrotal cellulitis, with leukocytosis, tachycardia, and mild temperature elevation, in a diabetic, male. Possible Fournier's gangrene.        Elmer Picker, MD 08/12/11 1316

## 2011-08-12 NOTE — Transfer of Care (Signed)
Immediate Anesthesia Transfer of Care Note  Patient: Edward [name]  Procedure(s) Performed:  IRRIGATION AND DEBRIDEMENT ABSCESS - irrigation and debridment perineum  ; IRRIGATION AND DEBRIDEMENT WOUND - perineum  Patient Location: PACU and ICU  Anesthesia Type: General  Level of Consciousness: sedated  Airway & Oxygen Therapy: Patient remains intubated per anesthesia plan and Patient placed on Ventilator (see vital sign flow sheet for setting)  Post-op Assessment: Report given to PACU RN and Post -op Vital signs reviewed and stable  Post vital signs: Reviewed and stable  Complications: No apparent anesthesia complications

## 2011-08-12 NOTE — ED Notes (Signed)
pcp is dr [facility]

## 2011-08-13 ENCOUNTER — Inpatient Hospital Stay (HOSPITAL_COMMUNITY): Payer: 59

## 2011-08-13 DIAGNOSIS — M726 Necrotizing fasciitis: Secondary | ICD-10-CM

## 2011-08-13 DIAGNOSIS — J9600 Acute respiratory failure, unspecified whether with hypoxia or hypercapnia: Secondary | ICD-10-CM

## 2011-08-13 DIAGNOSIS — A419 Sepsis, unspecified organism: Secondary | ICD-10-CM

## 2011-08-13 LAB — BASIC METABOLIC PANEL
BUN: 17 mg/dL (ref 6–23)
BUN: 18 mg/dL (ref 6–23)
CO2: 21 mEq/L (ref 19–32)
Calcium: 7.6 mg/dL — ABNORMAL LOW (ref 8.4–10.5)
Chloride: 106 mEq/L (ref 96–112)
Creatinine, Ser: 1.33 mg/dL (ref 0.50–1.35)
GFR calc Af Amer: 75 mL/min — ABNORMAL LOW (ref >90)
GFR calc non Af Amer: 60 mL/min — ABNORMAL LOW (ref >90)
Glucose, Bld: 131 mg/dL — ABNORMAL HIGH (ref 70–99)
Potassium: 3.8 mEq/L (ref 3.5–5.1)
Potassium: 3.4 mEq/L — ABNORMAL LOW (ref 3.5–5.1)

## 2011-08-13 LAB — CBC
Hemoglobin: 11.4 g/dL — ABNORMAL LOW (ref 13.0–17.0)
MCH: 28.9 pg (ref 26.0–34.0)
RBC: 3.95 MIL/uL — ABNORMAL LOW (ref 4.22–5.81)
WBC: 13.3 10*3/uL — ABNORMAL HIGH (ref 4.0–10.5)

## 2011-08-13 LAB — BLOOD GAS, ARTERIAL
Bicarbonate: 20.2 mEq/L (ref 20.0–24.0)
PEEP: 5 cmH2O
Patient temperature: 98.6
pH, Arterial: 7.355 (ref 7.350–7.450)

## 2011-08-13 LAB — LACTIC ACID, PLASMA: Lactic Acid, Venous: 2.7 mmol/L — ABNORMAL HIGH (ref 0.5–2.2)

## 2011-08-13 LAB — GLUCOSE, CAPILLARY: Glucose-Capillary: 140 mg/dL — ABNORMAL HIGH (ref 70–99)

## 2011-08-13 MED ORDER — SODIUM CHLORIDE 0.9 % IJ SOLN
INTRAMUSCULAR | Status: AC
  Administered 2011-08-13: 10:00:00
  Filled 2011-08-13: qty 10

## 2011-08-13 MED ORDER — SODIUM CHLORIDE 0.9 % IV BOLUS (SEPSIS)
500.0000 mL | Freq: Once | INTRAVENOUS | Status: AC
  Administered 2011-08-13: 500 mL via INTRAVENOUS

## 2011-08-13 MED ORDER — POTASSIUM CHLORIDE 10 MEQ/100ML IV SOLN
INTRAVENOUS | Status: AC
  Filled 2011-08-13: qty 100

## 2011-08-13 MED ORDER — SODIUM CHLORIDE 0.9 % IJ SOLN
INTRAMUSCULAR | Status: AC
  Filled 2011-08-13: qty 20

## 2011-08-13 MED ORDER — INSULIN ASPART 100 UNIT/ML ~~LOC~~ SOLN
0.0000 [IU] | SUBCUTANEOUS | Status: DC
  Administered 2011-08-13: 2 [IU] via SUBCUTANEOUS
  Administered 2011-08-13: 4 [IU] via SUBCUTANEOUS
  Administered 2011-08-13 – 2011-08-14 (×3): 2 [IU] via SUBCUTANEOUS
  Administered 2011-08-14 (×4): 4 [IU] via SUBCUTANEOUS
  Administered 2011-08-14: 2 [IU] via SUBCUTANEOUS
  Administered 2011-08-15 (×3): 4 [IU] via SUBCUTANEOUS
  Administered 2011-08-15: 2 [IU] via SUBCUTANEOUS
  Administered 2011-08-15: 4 [IU] via SUBCUTANEOUS
  Administered 2011-08-15 – 2011-08-16 (×5): 2 [IU] via SUBCUTANEOUS
  Filled 2011-08-13: qty 3

## 2011-08-13 MED ORDER — POTASSIUM CHLORIDE 10 MEQ/100ML IV SOLN
10.0000 meq | INTRAVENOUS | Status: AC
  Administered 2011-08-13 (×3): 10 meq via INTRAVENOUS
  Filled 2011-08-13 (×2): qty 100

## 2011-08-13 MED ORDER — MAGNESIUM SULFATE 50 % IJ SOLN: 1.0000 g | Freq: Once | INTRAVENOUS | Status: DC

## 2011-08-13 MED ORDER — MAGNESIUM SULFATE IN D5W 10-5 MG/ML-% IV SOLN
1.0000 g | Freq: Once | INTRAVENOUS | Status: AC
  Administered 2011-08-13: 1 g via INTRAVENOUS
  Filled 2011-08-13: qty 100

## 2011-08-13 MED ORDER — INSULIN GLARGINE 100 UNIT/ML ~~LOC~~ SOLN
20.0000 [IU] | SUBCUTANEOUS | Status: DC
  Administered 2011-08-13 – 2011-08-16 (×4): 20 [IU] via SUBCUTANEOUS
  Filled 2011-08-13: qty 3

## 2011-08-13 MED ORDER — DEXTROSE 10 % IV SOLN
INTRAVENOUS | Status: DC
  Administered 2011-08-13: 20 mL/h via INTRAVENOUS

## 2011-08-13 NOTE — Progress Notes (Signed)
eLink Physician-Brief Progress Note Patient Name: Edward [name] DOB: 11-02-1970 MRN: [id number]  Date of Service  08/13/2011   HPI/Events of Note   k 3.4 mag 1.6 Making 30-38ml urine per hour Creat 1.33 at 4pm  eICU Interventions  kcl 10 meq x 3 mag sulfate 1gm   Intervention Category Intermediate Interventions: Electrolyte abnormality - evaluation and management  Nataliya Graig 08/13/2011, 6:56 PM

## 2011-08-13 NOTE — Progress Notes (Signed)
Patient placed on contact isolation. Consulted infection prevention for necrotizing fascitis and open wounds. No family present at this time for education on isolation, will need to be addressed when present. Edward [name]

## 2011-08-13 NOTE — Progress Notes (Signed)
eLink Physician-Brief Progress Note Patient Name: Edward [name] DOB: 11/05/1970 MRN: [id number]  Date of Service  08/13/2011   HPI/Events of Note   CBGs controlled on IV insulin Low UO - no CVP  eICU Interventions  Transition off insulin gtt with 20 U lantus - resistant scale Fluid bolus & increase maintenance to 150/h   Intervention Category Major Interventions: Hyperglycemia - active titration of insulin therapy Intermediate Interventions: Oliguria - evaluation and management  ALVA,RAKESH V. 08/13/2011, 1:41 AM

## 2011-08-13 NOTE — Progress Notes (Signed)
Name: Edward [name] MRN: [id number] DOB: 10-Nov-1970    LOS: 1  PCCM ADMISSION NOTE  History of Present Illness: 40 year old male with history of diabetes. Reported to the ER today with increased pain in his perineum and fever. Fever started early this morning. No nausea or emesis. Work-up reveals leukocytosis and crepitus in the perineum.  Urology saw patient and decided to take to the OR for debridement    Lines / Drains: PIV ET tube 12/24>>>  Cultures: Blood 12/24>>> Sputum 12/24>>> Urine 12/24>>> Wound 12/24>>> GNR and GPC >>  Antibiotics: Clinda 12/24>>> Zosyn 12/24>>> Vancomycin 12/24>>>  Tests / Events: 12/24 to OR for debridement.  Subjective: Hemodynamically stable after debridement Sedate due to pain control needs  Vital Signs: Filed Vitals:   08/13/11 1100  BP: 103/68  Pulse: 102  Temp:   Resp: 18    Intake/Output Summary (Last 24 hours) at 08/13/11 1143 Last data filed at 08/13/11 1100  Gross per 24 hour  Intake 7110.7 ml  Output   1575 ml  Net 5535.7 ml    Physical Examination: General: No distress Neuro:  intubated and sedated.   HEENT:  [place]/AT, PERRL, EOM-I and moist mucous membranes. Neck:  Supple, -LAN and -thyromegally.   Cardiovascular:  RRR, Nl S1/S2, -M/R/G. Lungs:  CTA bilaterally. Abdomen:  Soft, tender suprapubically, ND and +BS. Musculoskeletal:  -edema and -tenderness. Skin:  WNL  Labs and Imaging:   CBC    Component Value Date/Time   WBC 13.3* 08/13/2011 0450   RBC 3.95* 08/13/2011 0450   HGB 11.4* 08/13/2011 0450   HCT 33.1* 08/13/2011 0450   PLT 165 08/13/2011 0450   MCV 83.8 08/13/2011 0450   MCH 28.9 08/13/2011 0450   MCHC 34.4 08/13/2011 0450   RDW 12.4 08/13/2011 0450   LYMPHSABS 0.6* 08/12/2011 1044   MONOABS 1.8* 08/12/2011 1044   EOSABS 0.0 08/12/2011 1044   BASOSABS 0.0 08/12/2011 1044   BMET    Component Value Date/Time   NA 136 08/13/2011 0450   K 3.8 08/13/2011 0450   CL 106 08/13/2011 0450   CO2 21  08/13/2011 0450   GLUCOSE 131* 08/13/2011 0450   BUN 17 08/13/2011 0450   CREATININE 1.43* 08/13/2011 0450   CALCIUM 7.6* 08/13/2011 0450   GFRNONAA 60* 08/13/2011 0450   GFRAA 69* 08/13/2011 0450    Lab 08/13/11 0450  MG 1.6   Lab Results  Component Value Date   CALCIUM 7.6* 08/13/2011   PHOS 3.4 08/13/2011    Assessment and Plan: 2 M with fournier's gangrene, urology evaluated the patient and called PCCM to admit as they will keep the patient sedated and intubated post procedure since he will likely numerous OR visits.  Patient went to OR, testicles placed in thigh and will need reconstructive surgery once more stable.    VDRF:  - vent support to allow pain control and possibly return trips to the [place] consider early trach if we believe he will require serial trips to the OR - pulm hygiene  fournier's gangrene: - vanco/zosyn/clinda - follow cx's - further debridement per urology recs  Acute renal failure  Lab 08/13/11 0450 08/12/11 1044  CREATININE 1.43* 1.08  - follow BMP - IVF's  FEN: - start TF's 12/26  Baltazar Apo, MD, PhD 08/13/2011, 11:53 AM [facility] [phone] or if no answer [phone]

## 2011-08-13 NOTE — Progress Notes (Signed)
1 Day Post-Op  Subjective: Intubated. sedated  Objective: Vital signs in last 24 hours: Temp:  [98.4 F (36.9 C)-101 F (38.3 C)] 99.2 F (37.3 C) (12/25 0400) Pulse Rate:  [101-126] 108  (12/25 0600) Resp:  [15-22] 18  (12/25 0600) BP: (80-124)/(53-86) 109/69 mmHg (12/25 0600) SpO2:  [96 %-100 %] 96 % (12/25 0600) FiO2 (%):  [40 %] 40 % (12/25 0700) Weight:  [251 lb 5.2 oz (114 kg)-251 lb 12.3 oz (114.2 kg)] 251 lb 12.3 oz (114.2 kg) (12/25 0415)    Intake/Output from previous day: 12/24 0701 - 12/25 0700 In: 6459.2 [I.V.:4534.2; IV Piggyback:1925] Out: 1460 [Urine:1060; Blood:150] Intake/Output this shift:    Male genitalia: wounds look clean  Lab Results:   [facility] 08/13/11 0450 08/12/11 1044  WBC 13.3* 19.8*  HGB 11.4* 15.3  HCT 33.1* 43.3  PLT 165 222   BMET  Basename 08/13/11 0450 08/12/11 1647 08/12/11 1044  NA 136 -- 132*  K 3.8 -- 3.8  CL 106 -- 95*  CO2 21 -- 20  GLUCOSE 131* 263* --  BUN 17 -- 14  CREATININE 1.43* -- 1.08  CALCIUM 7.6* -- 9.2   PT/INR  Basename 08/12/11 1746  LABPROT 18.0*  INR 1.46   ABG  Basename 08/13/11 0415 08/12/11 1815  PHART 7.355 7.363  HCO3 20.2 20.4    Studies/Results: US Scrotum  08/12/2011  *RADIOLOGY REPORT*  Clinical Data:  Left testicular pain and scrotal swelling.  SCROTAL ULTRASOUND DOPPLER ULTRASOUND OF THE TESTICLES  Technique: Complete ultrasound examination of the testicles, epididymis, and other scrotal structures was performed.  Color and spectral Doppler ultrasound were also utilized to evaluate blood flow to the testicles.  Comparison:  None  Findings:  Right testis:  4.1 x 1.9 x 2.4 cm.  No testicular mass or microlithiasis identified.  Left testis:  4.2 x 2.0 x 2.8 cm.  No testicular mass or microlithiasis identified.  Right epididymis:  Normal in size and appearance.  Left epididymis:  Normal in size and appearance.  Hydocele:  None visualized.  Varicocele:  Moderate varicoceles are seen  bilaterally.  Asymmetric left scrotal wall thickening also seen, which may be due to edema or cellulitis.  No focal fluid collection identified.  Pulsed Doppler interrogation of both testes demonstrates low resistance flow bilaterally.  IMPRESSION:  1.  No evidence of testicular mass or torsion. 2.  Moderate bilateral varicoceles. 3.  Asymmetric left scrotal wall thickening, which may be due to edema or cellulitis.  No abscess or hydrocele identified.  Original Report Authenticated By: Marlaine Hind, M.D.   [place] Art/ven Flow Abd Pelv Doppler  08/12/2011  *RADIOLOGY REPORT*  Clinical Data:  Left testicular pain and scrotal swelling.  SCROTAL ULTRASOUND DOPPLER ULTRASOUND OF THE TESTICLES  Technique: Complete ultrasound examination of the testicles, epididymis, and other scrotal structures was performed.  Color and spectral Doppler ultrasound were also utilized to evaluate blood flow to the testicles.  Comparison:  None  Findings:  Right testis:  4.1 x 1.9 x 2.4 cm.  No testicular mass or microlithiasis identified.  Left testis:  4.2 x 2.0 x 2.8 cm.  No testicular mass or microlithiasis identified.  Right epididymis:  Normal in size and appearance.  Left epididymis:  Normal in size and appearance.  Hydocele:  None visualized.  Varicocele:  Moderate varicoceles are seen bilaterally.  Asymmetric left scrotal wall thickening also seen, which may be due to edema or cellulitis.  No focal fluid collection identified.  Pulsed Doppler  interrogation of both testes demonstrates low resistance flow bilaterally.  IMPRESSION:  1.  No evidence of testicular mass or torsion. 2.  Moderate bilateral varicoceles. 3.  Asymmetric left scrotal wall thickening, which may be due to edema or cellulitis.  No abscess or hydrocele identified.  Original Report Authenticated By: Marlaine Hind, M.D.   Dg Chest Port 1 View  08/13/2011  *RADIOLOGY REPORT*  Clinical Data: Endotracheal tube placement  PORTABLE CHEST - 1 VIEW  Comparison:  Portable chest x-ray of 08/12/2011  Findings: The tip of the endotracheal tube is approximately 3.0 cm above the carina.  The mild basilar atelectasis is present. Cardiomegaly is stable.  An NG tube is noted.  IMPRESSION:  1.  Endotracheal tip 3.0 cm above carina. 2.  Mild basilar atelectasis.  Original Report Authenticated By: Joretta Bachelor, M.D.   Dg Chest Port 1 View  08/12/2011  *RADIOLOGY REPORT*  Clinical Data: Endotracheal tube placement  PORTABLE CHEST - 1 VIEW  Comparison: 05/17/2011  Findings: 1733 hours.  Endotracheal tube tip is 2.1 cm above the base of the carina.  NG tube tip overlies the proximal stomach. Lung volumes are low. Cardiopericardial silhouette is at upper limits of normal for size. There is pulmonary vascular congestion without overt pulmonary edema. Telemetry leads overlie the chest.  IMPRESSION: Endotracheal tube tip is 2.1 cm above the base of the carina.  Original Report Authenticated By: ERIC A. MANSELL, M.D.    Anti-infectives: Anti-infectives     Start     Dose/Rate Route Frequency Ordered Stop   08/13/11 0430   vancomycin (VANCOCIN) IVPB 1000 mg/200 mL premix        1,000 mg 200 mL/hr over 60 Minutes Intravenous Every 12 hours 08/12/11 1724     08/13/11 0400   vancomycin (VANCOCIN) IVPB 1000 mg/200 mL premix  Status:  Discontinued        1,000 mg 200 mL/hr over 60 Minutes Intravenous Every 12 hours 08/12/11 1520 08/12/11 1724   08/12/11 2100   piperacillin-tazobactam (ZOSYN) IVPB 3.375 g        3.375 g 12.5 mL/hr over 240 Minutes Intravenous 3 times per day 08/12/11 1725     08/12/11 2000   clindamycin (CLEOCIN) IVPB 900 mg        900 mg 100 mL/hr over 30 Minutes Intravenous 3 times per day 08/12/11 1725     08/12/11 1800   clindamycin (CLEOCIN) IVPB 900 mg  Status:  Discontinued        900 mg 100 mL/hr over 30 Minutes Intravenous 4 times per day 08/12/11 1508 08/12/11 1509   08/12/11 1800   piperacillin-tazobactam (ZOSYN) IVPB 3.375 g  Status:   Discontinued        3.375 g 12.5 mL/hr over 240 Minutes Intravenous 4 times per day 08/12/11 1508 08/12/11 1508   08/12/11 1623   polymyxin B 500,000 Units, bacitracin 50,000 Units in sodium chloride irrigation 0.9 % 500 mL irrigation  Status:  Discontinued          As needed 08/12/11 1624 08/12/11 1656   08/12/11 1515   vancomycin (VANCOCIN) IVPB 1000 mg/200 mL premix     Comments: Please send to OR (tube station 27)      1,000 mg 200 mL/hr over 60 Minutes Intravenous  Once 08/12/11 1507 08/12/11 1825   08/12/11 1330   vancomycin (VANCOCIN) IVPB 1000 mg/200 mL premix        1,000 mg 200 mL/hr over 60 Minutes Intravenous  Once  08/12/11 1316 08/12/11 1457   08/12/11 1300   piperacillin-tazobactam (ZOSYN) IVPB 3.375 g        3.375 g 12.5 mL/hr over 240 Minutes Intravenous  Once 08/12/11 1248 08/12/11 1656          Assessment/Plan: s/p Procedure(s): IRRIGATION AND DEBRIDEMENT ABSCESS IRRIGATION AND DEBRIDEMENT WOUND continue abx Start dressing changes today per urology  LOS: 1 day    TOTH III,PAUL S 08/13/2011

## 2011-08-13 NOTE — Progress Notes (Signed)
Urology Progress Note  Subjective:     No acute urologic events. Patient remains intubated and sedated.  Objective:  Patient Vitals for the past 24 hrs:  BP Temp Temp src Pulse Resp SpO2 Height Weight  08/13/11 0415 - - - - - - - 114.2 kg (251 lb 12.3 oz)  08/13/11 0400 101/67 mmHg 99.2 F (37.3 C) Oral 101  18  100 % - -  08/13/11 0200 86/60 mmHg - - 106  18  99 % - -  08/13/11 0130 101/67 mmHg - - 109  18  99 % - -  08/13/11 0115 106/68 mmHg - - 108  18  99 % - -  08/13/11 0000 - 99.2 F (37.3 C) Oral - - - - -  08/12/11 2330 89/62 mmHg - - 113  18  99 % - -  08/12/11 2300 89/57 mmHg - - 113  18  99 % - -  08/12/11 2200 96/64 mmHg - - 107  18  100 % - -  08/12/11 2130 95/65 mmHg - - 106  18  99 % - -  08/12/11 2115 90/64 mmHg - - 106  18  99 % - -  08/12/11 2030 90/66 mmHg - - 107  17  99 % - -  08/12/11 2015 92/65 mmHg - - 107  17  99 % - -  08/12/11 2000 100/71 mmHg 98.5 F (36.9 C) Oral 109  18  100 % - -  08/12/11 1930 107/78 mmHg - - 109  16  99 % - -  08/12/11 1915 118/82 mmHg - - 109  18  99 % - -  08/12/11 1900 108/75 mmHg - - 109  18  99 % - -  08/12/11 1830 113/74 mmHg - - 108  18  98 % - -  08/12/11 1800 114/85 mmHg - - 109  18  99 % - -  08/12/11 1748 - 98.4 F (36.9 C) - - - - - 114 kg (251 lb 5.2 oz)  08/12/11 1730 118/71 mmHg - - 109  15  97 % - -  08/12/11 1725 119/69 mmHg - - 109  15  97 % - -  08/12/11 1719 124/76 mmHg - - 112  15  100 % 6\' 1"  (1.854 m) -  08/12/11 1335 105/53 mmHg - - 124  20  98 % - -  08/12/11 1330 105/53 mmHg - - 124  17  98 % - -  08/12/11 1213 - 99.5 F (37.5 C) Rectal - - - - -  08/12/11 1115 103/61 mmHg - - 114  17  98 % - -  08/12/11 1114 - 99.7 F (37.6 C) Oral 114  16  98 % - -  08/12/11 1058 110/75 mmHg - - - - - - -  08/12/11 0959 119/86 mmHg 98.9 F (37.2 C) Oral 120  22  98 % - -    Physical Exam: General:  No acute distressAbdomen:            Soft, NTTP    Genitourinary:  Wound dressed with clean edges. Foley:   Draining clear yellow urine    I/O last 3 completed shifts: In: 3851.6 [I.V.:2851.6; IV Piggyback:1000] Out: [id number] [Urine:745; Other:250; Blood:150]     Assessment: Fournier's gangrene of the perineum Plan: -It does not appear any further GU debridement is needed. -Will defer to general surgery for coordinating a second look in the OR for further possible debridement. -Will also  defer to general surgery for wound care management.    Rolan Bucco, MD [phone]

## 2011-08-13 NOTE — Op Note (Signed)
NAME:  Edward [name], Edward [name] NO.:  [id number]  MEDICAL RECORD NO.:  [id number]  LOCATION:  [facility]01                         FACILITY:  [facility]  PHYSICIAN:  Rolan Bucco, MD    DATE OF BIRTH:  [birth year]  DATE OF PROCEDURE:  08/12/2011 DATE OF DISCHARGE:                                OPERATIVE REPORT   PREOPERATIVE DIAGNOSIS:  Fournier gangrene.  POSTOPERATIVE DIAGNOSIS:  Fournier gangrene.  PROCEDURE:  Genital wound debridement.  SURGEON:  Rolan Bucco, MD  ASSISTANT:  Merri Ray. Grandville Silos, M.D., General Surgery.  ESTIMATED BLOOD LOSS:  150 mL.  FINDINGS:  Necrotic perineal, perianal, and scrotal tissue.  SPECIMEN:  Wound culture sent for aerobic and anaerobic bacteria. Tissue also sent for culture.  DRAINS:  Foley catheter.  COMPLICATIONS:  None.  HISTORY OF PRESENT ILLNESS:  A 40 year old gentleman who has poorly controlled diabetes.  He presented to the ER with perineal discomfort. He was found to have crepitus in his perineum and it was felt that he had Fournier gangrene.  He is counseled regarding treatment options.  I advised he go to the operating room, he agreed.  Informed consent was obtained.  PROCEDURE:  After informed consent was obtained, the patient was taken to operating room, was placed in supine position.  IV antibiotics were infused and general anesthesia was induced.  He was then placed in a dorsal lithotomy position where his neurovascular pressure points were padded appropriately.  A time-out was then performed in which the correct patient, surgical site, and procedure were identified and agreed upon by the team.  Following this, his genitals were prepped and draped in the usual fashion, including his genitals and perineum and anus. After this was done, Foley catheter was placed in the urethra using a sterile technique.  A 10 mL of sterile water were placed in the bladder. There was return of clear yellow urine.  After this was done,  incision was made over the perineum extending anteriorly up the posterior scrotum.  There was necrotic tissue noted in this area and this was dissected down to the tunica vaginalis.  Although, overlying tissue was removed, it appeared that the shaft skin was spared.  The testicles were dissected out and both found to be viable.  The tunica vaginalis was entered to ensure there was no pyocele.  After this was done, a running locking chromic suture was used to run the edge of the tunica vaginalis to prevent accumulation of bacteria, fluid, or other material. After this was done, dissection was carried down posteriorly.  The entire perineum was involved tracking down towards the anus.  At that point, I called the General Surgeon as the necrotic tissue was involving the anal triangle.  All of my dissection was performed with Bovie electrocautery.  It was completed and the wound was washed out with a Pulsavac with double antibiotic irrigation and sterile normal saline.  Over 1 L of fluid was used.  After this was done, silk sutures on taper needle were used to secure the testicles to each other in their normal anatomical position to prevent torsion of either testicle.  This was done in the midline. After that, Dr.  Grandville Silos with general surgery was called in to take over the remaining debridement.  I left the patient in the care of Dr. Grandville Silos.  I instructed nursing staff how to dress the wound with moist wet to dry Kerlix and Vaseline gauze around the testicles. The patient will be admitted to the medical ICU team, where he will remain intubated overnight.          ______________________________ Rolan Bucco, MD     DW/MEDQ  D:  08/12/2011  T:  08/13/2011  Job:  [id number]  cc:   Merri Ray. Grandville Silos, M.D.

## 2011-08-14 ENCOUNTER — Inpatient Hospital Stay (HOSPITAL_COMMUNITY): Payer: 59

## 2011-08-14 ENCOUNTER — Inpatient Hospital Stay (HOSPITAL_COMMUNITY): Payer: 59 | Admitting: Anesthesiology

## 2011-08-14 ENCOUNTER — Encounter (HOSPITAL_COMMUNITY)
Admission: EM | Disposition: A | Discharge status: Home Health | Payer: Self-pay | Source: Home / Self Care | Attending: Pulmonary Disease

## 2011-08-14 ENCOUNTER — Encounter (HOSPITAL_COMMUNITY): Payer: Self-pay | Admitting: Anesthesiology

## 2011-08-14 HISTORY — PX: IRRIGATION AND DEBRIDEMENT ABSCESS: SHX5252

## 2011-08-14 LAB — PHOSPHORUS: Phosphorus: 2.9 mg/dL (ref 2.3–4.6)

## 2011-08-14 LAB — CBC
HCT: 31.6 % — ABNORMAL LOW (ref 39.0–52.0)
Platelets: 178 10*3/uL (ref 150–400)
RBC: 3.73 MIL/uL — ABNORMAL LOW (ref 4.22–5.81)
RDW: 12.8 % (ref 11.5–15.5)
WBC: 11.6 10*3/uL — ABNORMAL HIGH (ref 4.0–10.5)

## 2011-08-14 LAB — URINE CULTURE
Colony Count: NO GROWTH
Culture  Setup Time: 201212241922

## 2011-08-14 LAB — BASIC METABOLIC PANEL
BUN: 19 mg/dL (ref 6–23)
Chloride: 108 mEq/L (ref 96–112)
GFR calc Af Amer: 81 mL/min — ABNORMAL LOW (ref >90)
GFR calc non Af Amer: 70 mL/min — ABNORMAL LOW (ref >90)
Glucose, Bld: 174 mg/dL — ABNORMAL HIGH (ref 70–99)
Potassium: 3.5 mEq/L (ref 3.5–5.1)
Sodium: 137 mEq/L (ref 135–145)

## 2011-08-14 LAB — POCT I-STAT 3, ART BLOOD GAS (G3+)
Acid-base deficit: 5 mmol/L — ABNORMAL HIGH (ref 0.0–2.0)
O2 Saturation: 95 %

## 2011-08-14 LAB — GLUCOSE, CAPILLARY
Glucose-Capillary: 154 mg/dL — ABNORMAL HIGH (ref 70–99)
Glucose-Capillary: 163 mg/dL — ABNORMAL HIGH (ref 70–99)
Glucose-Capillary: 199 mg/dL — ABNORMAL HIGH (ref 70–99)

## 2011-08-14 LAB — VANCOMYCIN, TROUGH: Vancomycin Tr: 9.9 ug/mL — ABNORMAL LOW (ref 10.0–20.0)

## 2011-08-14 LAB — PROCALCITONIN: Procalcitonin: 6.4 ng/mL

## 2011-08-14 SURGERY — MINOR INCISION AND DRAINAGE OF ABSCESS
Anesthesia: General | Site: Perineum | Wound class: Contaminated

## 2011-08-14 MED ORDER — FENTANYL CITRATE 0.05 MG/ML IJ SOLN: 50.0000 ug | INTRAMUSCULAR | Status: DC | PRN

## 2011-08-14 MED ORDER — SODIUM CHLORIDE 0.9 % IR SOLN
Status: DC | PRN
  Administered 2011-08-14: 15:00:00

## 2011-08-14 MED ORDER — LACTATED RINGERS IV SOLN
INTRAVENOUS | Status: DC | PRN
  Administered 2011-08-14: 14:00:00 via INTRAVENOUS

## 2011-08-14 MED ORDER — 0.9 % SODIUM CHLORIDE (POUR BTL) OPTIME
TOPICAL | Status: DC | PRN
  Administered 2011-08-14: 1000 mL

## 2011-08-14 MED ORDER — GLUCERNA 1.2 CAL PO LIQD
75.0000 mL/h | Freq: Two times a day (BID) | ORAL | Status: DC
  Filled 2011-08-14 (×5): qty 237

## 2011-08-14 MED ORDER — VANCOMYCIN HCL 1000 MG IV SOLR
1750.0000 mg | Freq: Two times a day (BID) | INTRAVENOUS | Status: DC
  Administered 2011-08-14 – 2011-08-15 (×3): 1750 mg via INTRAVENOUS
  Filled 2011-08-14 (×4): qty 1750

## 2011-08-14 MED ORDER — SODIUM CHLORIDE 0.9 % IJ SOLN
INTRAMUSCULAR | Status: AC
  Administered 2011-08-14: 08:00:00
  Filled 2011-08-14: qty 10

## 2011-08-14 MED ORDER — PROMETHAZINE HCL 25 MG/ML IJ SOLN: 6.2500 mg | INTRAMUSCULAR | Status: DC | PRN

## 2011-08-14 MED ORDER — FENTANYL CITRATE 0.05 MG/ML IJ SOLN
INTRAMUSCULAR | Status: DC | PRN
  Administered 2011-08-14: 50 ug via INTRAVENOUS
  Administered 2011-08-14: 100 ug via INTRAVENOUS
  Administered 2011-08-14 (×2): 50 ug via INTRAVENOUS

## 2011-08-14 MED ORDER — HYDROMORPHONE HCL PF 1 MG/ML IJ SOLN: 0.2500 mg | INTRAMUSCULAR | Status: DC | PRN

## 2011-08-14 MED ORDER — ROCURONIUM BROMIDE 100 MG/10ML IV SOLN
INTRAVENOUS | Status: DC | PRN
  Administered 2011-08-14 (×2): 50 mg via INTRAVENOUS

## 2011-08-14 MED ORDER — LORAZEPAM 2 MG/ML IJ SOLN: 1.0000 mg | Freq: Once | INTRAMUSCULAR | Status: AC | PRN

## 2011-08-14 MED ORDER — MIDAZOLAM HCL 2 MG/2ML IJ SOLN: 1.0000 mg | INTRAMUSCULAR | Status: DC | PRN

## 2011-08-14 MED ORDER — PROPOFOL 10 MG/ML IV EMUL
INTRAVENOUS | Status: DC | PRN
  Administered 2011-08-14: 50 mg via INTRAVENOUS

## 2011-08-14 SURGICAL SUPPLY — 24 items
CORD ACT VALLEY LAB BOVIE ASPN (MISCELLANEOUS) ×1 IMPLANT
DRSG PAD ABDOMINAL 8X10 ST (GAUZE/BANDAGES/DRESSINGS) ×1 IMPLANT
ELECT REM PT RETURN 9FT ADLT (ELECTROSURGICAL) ×2
ELECTRODE REM PT RTRN 9FT ADLT (ELECTROSURGICAL) IMPLANT
FLUID NSS /IRRIG 3000 ML XXX (IV SOLUTION) ×1 IMPLANT
GAUZE SPONGE 4X4 16PLY XRAY LF (GAUZE/BANDAGES/DRESSINGS) ×1 IMPLANT
GLOVE BIOGEL PI IND STRL 7.5 (GLOVE) IMPLANT
GLOVE BIOGEL PI INDICATOR 7.5 (GLOVE) ×1
GLOVE ECLIPSE 7.0 STRL STRAW (GLOVE) ×1 IMPLANT
GOWN BRE IMP SLV AUR LG STRL (GOWN DISPOSABLE) ×2 IMPLANT
KIT BASIN OR (CUSTOM PROCEDURE TRAY) ×1 IMPLANT
PACK LITHOTOMY IV (CUSTOM PROCEDURE TRAY) ×1 IMPLANT
PACK SURGICAL SETUP 50X90 (CUSTOM PROCEDURE TRAY) ×1 IMPLANT
SPONGE GAUZE 4X4 12PLY (GAUZE/BANDAGES/DRESSINGS) ×1 IMPLANT
SPONGE GAUZE 4X4 FOR O.R. (GAUZE/BANDAGES/DRESSINGS) ×1 IMPLANT
SPONGE LAP 18X18 X RAY DECT (DISPOSABLE) ×1 IMPLANT
SUT VIC AB 2-0 SH 27 (SUTURE) ×4
SUT VIC AB 2-0 SH 27X BRD (SUTURE) IMPLANT
SUT VIC AB 3-0 SH 27 (SUTURE) ×4
SUT VIC AB 3-0 SH 27XBRD (SUTURE) IMPLANT
TOWEL OR 17X24 6PK STRL BLUE (TOWEL DISPOSABLE) ×1 IMPLANT
TOWEL OR 17X26 10 PK STRL BLUE (TOWEL DISPOSABLE) ×1 IMPLANT
TUBE SUCT ARGYLE STRL (TUBING) ×1 IMPLANT
YANKAUER SUCT BULB TIP NO VENT (SUCTIONS) ×1 IMPLANT

## 2011-08-14 NOTE — Progress Notes (Signed)
Do not start tube feeds until verified by urology/general surgery per Dr. Lamonte Sakai.

## 2011-08-14 NOTE — Progress Notes (Signed)
[name] Progress Note 2 Days Post-Op  Subjective: The patient is due for dressing change and creation of testicular pouches in the thighs.  Possible wound debridement per Dr. Jasmine December.  Objective: Vital signs in last 24 hours: Temp:  [97.7 F (36.5 C)-100.2 F (37.9 C)] 97.7 F (36.5 C) (12/26 0832) Pulse Rate:  [90-104] 90  (12/26 0800) Resp:  [16-21] 18  (12/26 0800) BP: (89-118)/(57-73) 101/69 mmHg (12/26 0800) SpO2:  [91 %-100 %] 98 % (12/26 0800) FiO2 (%):  [30 %-50 %] 30 % (12/26 0800) Weight:  [256 lb 9.9 oz (116.4 kg)] 256 lb 9.9 oz (116.4 kg) (12/26 0500)    Intake/Output from previous day: 12/25 0701 - 12/26 0700 In: 5228 [I.V.:3103; IV Piggyback:2125] Out: 1005 [Urine:1005] Intake/Output this shift: Total I/O In: 161 [I.V.:161] Out: 60 [Urine:60]  General: On ventilator.  Lungs: CXR shows bilateral atelectasis, possible left effusion, interstitial disease or fluid.    Abd: Soft, nontender.  Not eating.  Extremities: Perineum is not involving the anterior "anal triangle" from what I can tell at the bedside.  Neuro: Arousable, alert, oriented.  Lab Results:  @LABLAST2 (wbc:2,hgb:2,hct:2,plt:2) BMET  [facility] 08/14/11 0520 08/13/11 1545  NA 137 136  K 3.5 3.4*  CL 108 106  CO2 19 21  GLUCOSE 174* 152*  BUN 19 18  CREATININE 1.25 1.33  CALCIUM 8.0* 7.8*   PT/INR  Basename 08/12/11 1746  LABPROT 18.0*  INR 1.46   ABG  Basename 08/14/11 0557 08/13/11 0415  PHART 7.403 7.355  HCO3 19.0* 20.2    Studies/Results: US Scrotum  08/12/2011  *RADIOLOGY REPORT*  Clinical Data:  Left testicular pain and scrotal swelling.  SCROTAL ULTRASOUND DOPPLER ULTRASOUND OF THE TESTICLES  Technique: Complete ultrasound examination of the testicles, epididymis, and other scrotal structures was performed.  Color and spectral Doppler ultrasound were also utilized to evaluate blood flow to the testicles.  Comparison:  None  Findings:  Right testis:  4.1 x 1.9 x 2.4 cm.   No testicular mass or microlithiasis identified.  Left testis:  4.2 x 2.0 x 2.8 cm.  No testicular mass or microlithiasis identified.  Right epididymis:  Normal in size and appearance.  Left epididymis:  Normal in size and appearance.  Hydocele:  None visualized.  Varicocele:  Moderate varicoceles are seen bilaterally.  Asymmetric left scrotal wall thickening also seen, which may be due to edema or cellulitis.  No focal fluid collection identified.  Pulsed Doppler interrogation of both testes demonstrates low resistance flow bilaterally.  IMPRESSION:  1.  No evidence of testicular mass or torsion. 2.  Moderate bilateral varicoceles. 3.  Asymmetric left scrotal wall thickening, which may be due to edema or cellulitis.  No abscess or hydrocele identified.  Original Report Authenticated By: Marlaine Hind, M.D.   [place] Art/ven Flow Abd Pelv Doppler  08/12/2011  *RADIOLOGY REPORT*  Clinical Data:  Left testicular pain and scrotal swelling.  SCROTAL ULTRASOUND DOPPLER ULTRASOUND OF THE TESTICLES  Technique: Complete ultrasound examination of the testicles, epididymis, and other scrotal structures was performed.  Color and spectral Doppler ultrasound were also utilized to evaluate blood flow to the testicles.  Comparison:  None  Findings:  Right testis:  4.1 x 1.9 x 2.4 cm.  No testicular mass or microlithiasis identified.  Left testis:  4.2 x 2.0 x 2.8 cm.  No testicular mass or microlithiasis identified.  Right epididymis:  Normal in size and appearance.  Left epididymis:  Normal in size and appearance.  Hydocele:  None  visualized.  Varicocele:  Moderate varicoceles are seen bilaterally.  Asymmetric left scrotal wall thickening also seen, which may be due to edema or cellulitis.  No focal fluid collection identified.  Pulsed Doppler interrogation of both testes demonstrates low resistance flow bilaterally.  IMPRESSION:  1.  No evidence of testicular mass or torsion. 2.  Moderate bilateral varicoceles. 3.  Asymmetric  left scrotal wall thickening, which may be due to edema or cellulitis.  No abscess or hydrocele identified.  Original Report Authenticated By: Marlaine Hind, M.D.   Dg Chest Port 1 View  08/14/2011  *RADIOLOGY REPORT*  Clinical Data: Check tube position.  PORTABLE CHEST - 1 VIEW  Comparison: 08/13/2011  Findings: Endotracheal tube tip is positioned 2.9 cm proximal to the carina.  NG tube descends into the abdomen.  Cardiomegaly. Central vascular congestion.  Mild bibasilar opacities.  No pneumothorax or definite pleural effusion.  No acute osseous abnormality.  IMPRESSION: Cardiomegaly with central vascular congestion.  Bibasilar opacities; atelectasis versus infiltrate.  Endotracheal tube is appropriately positioned.  Original Report Authenticated By: Suanne Marker, M.D.   Dg Chest Port 1 View  08/13/2011  *RADIOLOGY REPORT*  Clinical Data: Endotracheal tube placement  PORTABLE CHEST - 1 VIEW  Comparison: Portable chest x-ray of 08/12/2011  Findings: The tip of the endotracheal tube is approximately 3.0 cm above the carina.  The mild basilar atelectasis is present. Cardiomegaly is stable.  An NG tube is noted.  IMPRESSION:  1.  Endotracheal tip 3.0 cm above carina. 2.  Mild basilar atelectasis.  Original Report Authenticated By: Joretta Bachelor, M.D.   Dg Chest Port 1 View  08/12/2011  *RADIOLOGY REPORT*  Clinical Data: Endotracheal tube placement  PORTABLE CHEST - 1 VIEW  Comparison: 05/17/2011  Findings: 1733 hours.  Endotracheal tube tip is 2.1 cm above the base of the carina.  NG tube tip overlies the proximal stomach. Lung volumes are low. Cardiopericardial silhouette is at upper limits of normal for size. There is pulmonary vascular congestion without overt pulmonary edema. Telemetry leads overlie the chest.  IMPRESSION: Endotracheal tube tip is 2.1 cm above the base of the carina.  Original Report Authenticated By: ERIC A. MANSELL, M.D.    Anti-infectives: Anti-infectives     Start      Dose/Rate Route Frequency Ordered Stop   08/13/11 0430   vancomycin (VANCOCIN) IVPB 1000 mg/200 mL premix        1,000 mg 200 mL/hr over 60 Minutes Intravenous Every 12 hours 08/12/11 1724     08/13/11 0400   vancomycin (VANCOCIN) IVPB 1000 mg/200 mL premix  Status:  Discontinued        1,000 mg 200 mL/hr over 60 Minutes Intravenous Every 12 hours 08/12/11 1520 08/12/11 1724   08/12/11 2100  piperacillin-tazobactam (ZOSYN) IVPB 3.375 g       3.375 g 12.5 mL/hr over 240 Minutes Intravenous 3 times per day 08/12/11 1725     08/12/11 2000   clindamycin (CLEOCIN) IVPB 900 mg        900 mg 100 mL/hr over 30 Minutes Intravenous 3 times per day 08/12/11 1725     08/12/11 1800   clindamycin (CLEOCIN) IVPB 900 mg  Status:  Discontinued        900 mg 100 mL/hr over 30 Minutes Intravenous 4 times per day 08/12/11 1508 08/12/11 1509   08/12/11 1800   piperacillin-tazobactam (ZOSYN) IVPB 3.375 g  Status:  Discontinued        3.375 g 12.5  mL/hr over 240 Minutes Intravenous 4 times per day 08/12/11 1508 08/12/11 1508   08/12/11 1623   polymyxin B 500,000 Units, bacitracin 50,000 Units in sodium chloride irrigation 0.9 % 500 mL irrigation  Status:  Discontinued          As needed 08/12/11 1624 08/12/11 1656   08/12/11 1515   vancomycin (VANCOCIN) IVPB 1000 mg/200 mL premix     Comments: Please send to OR (tube station 27)      1,000 mg 200 mL/hr over 60 Minutes Intravenous  Once 08/12/11 1507 08/12/11 1825   08/12/11 1330   vancomycin (VANCOCIN) IVPB 1000 mg/200 mL premix        1,000 mg 200 mL/hr over 60 Minutes Intravenous  Once 08/12/11 1316 08/12/11 1457   08/12/11 1300  piperacillin-tazobactam (ZOSYN) IVPB 3.375 g       3.375 g 12.5 mL/hr over 240 Minutes Intravenous  Once 08/12/11 1248 08/12/11 1656          Assessment/Plan: s/p Procedure(s): IRRIGATION AND DEBRIDEMENT ABSCESS IRRIGATION AND DEBRIDEMENT WOUND Will go to the OR and examine the wound with Dr. Jasmine December as a  consultant.  LOS: 2 days   Kathryne Eriksson. Dahlia Bailiff, MD, FACS [phone] [facility] Surgery 08/14/2011

## 2011-08-14 NOTE — Progress Notes (Signed)
ANTIBIOTIC CONSULT NOTE - FOLLOW UP  Pharmacy Consult for Vancomycin and Zosyn Indication: Fournier's gangrene  No Known Allergies  Admit Complaint:  40 yo diabetic admitted with fever and pain in perineum found with fournier's gangrene.  Pharmacist System-Based Medication Review: Infectious Disease: Fournier's gangrene of perineum s/p I&D. For further OR debridement today. D3 Zosyn / Vancomycin / Clindamycin. Tmax 99.8 declining. WBC 11.6. 12/24 UCx pending, 12/24 Trach Cx pending, 12/24 Abscess Cx = GPC, GNR, 12/24 blood x2 pending. Given changing renal function and positive abscess cultures will check trough today prior to 1630 dose, fourth dose of this regimen. Cardiovascular: hypertension, PTA lisinopril, BP well controlled off therapy. Endocrinology: diabetes, SSI, PTA metformin, glucose 112-167 Gastrointestinal / Nutrition: TF start pending urology input Neurology: Sedated on vent, fentanyl at 10 mg/hr, versed at 1 mg/hr, RASS = -1 Nephrology: Acute renal failure, resolved CrCl 104 ml/min Pulmonary: vent support continued PTA Medication Issues: resume metformin, lisinopril when clinically appropriate Best Practices: Mouth Care, SCD, PPI  Goal of Therapy:  Vancomycin trough 15-20 mcg/ml  Plan:  1. Check Vancomycin trough at 1600 today, prior to 1630 dose. 2. Follow up SCr, UOP, cultures, clinical course and adjust as clinically indicated.  Patient Measurements: Height: 6\' 1"  (185.4 cm) Weight: 256 lb 9.9 oz (116.4 kg) IBW/kg (Calculated) : 79.9   Vital Signs: Temp: 99.2 F (37.3 C) (12/26 1222) Temp src: Axillary (12/26 1222) BP: 103/68 mmHg (12/26 1300) Pulse Rate: 93  (12/26 1300) Intake/Output from previous day: 12/25 0701 - 12/26 0700 In: 5228 [I.V.:3103; IV Piggyback:2125] Out: [id number] [Urine:1065] Intake/Output from this shift: Total I/O In: 644 [I.V.:644] Out: 85 [Urine:85]  Labs:  [facility] 08/14/11 0520 08/13/11 1545 08/13/11 0450 08/12/11 1044  WBC 11.6*  -- 13.3* 19.8*  HGB 10.6* -- 11.4* 15.3  PLT 178 -- 165 222  LABCREA -- -- -- --  CREATININE 1.25 1.33 1.43* --   Estimated Creatinine Clearance: 104 ml/min (by C-G formula based on Cr of 1.25). No results found for this basename: VANCOTROUGH:2,VANCOPEAK:2,VANCORANDOM:2,GENTTROUGH:2,GENTPEAK:2,GENTRANDOM:2,TOBRATROUGH:2,TOBRAPEAK:2,TOBRARND:2,AMIKACINPEAK:2,AMIKACINTROU:2,AMIKACIN:2, in the last 72 hours   Microbiology: Recent Results (from the past 720 hour(s))  CULTURE, BLOOD (ROUTINE X 2)     Status: Normal (Preliminary result)   Collection Time   08/12/11 10:50 AM      Component Value Range Status Comment   Specimen Description BLOOD LEFT ARM   Final    Special Requests BOTTLES DRAWN AEROBIC AND ANAEROBIC 4CC   Final    Setup Time [id number]   Final    Culture     Final    Value:        BLOOD CULTURE RECEIVED NO GROWTH TO DATE CULTURE WILL BE HELD FOR 5 DAYS BEFORE ISSUING A FINAL NEGATIVE REPORT   Report Status PENDING   Incomplete   CULTURE, BLOOD (ROUTINE X 2)     Status: Normal (Preliminary result)   Collection Time   08/12/11 10:54 AM      Component Value Range Status Comment   Specimen Description BLOOD RIGHT ARM   Final    Special Requests BOTTLES DRAWN AEROBIC AND ANAEROBIC 10CC   Final    Setup Time [id number]   Final    Culture     Final    Value:        BLOOD CULTURE RECEIVED NO GROWTH TO DATE CULTURE WILL BE HELD FOR 5 DAYS BEFORE ISSUING A FINAL NEGATIVE REPORT   Report Status PENDING   Incomplete   CULTURE, ROUTINE-ABSCESS     Status:  Normal (Preliminary result)   Collection Time   08/12/11  5:11 PM      Component Value Range Status Comment   Specimen Description ABSCESS PERINEAL   Final    Special Requests PT ON VANC   Final    Gram Stain     Final    Value: NO WBC SEEN     NO SQUAMOUS EPITHELIAL CELLS SEEN     MODERATE GRAM NEGATIVE RODS     FEW GRAM POSITIVE COCCI     IN PAIRS   Culture Culture reincubated for better growth   Final    Report Status  PENDING   Incomplete   ANAEROBIC CULTURE     Status: Normal (Preliminary result)   Collection Time   08/12/11  5:11 PM      Component Value Range Status Comment   Specimen Description ABSCESS PERINEAL   Final    Special Requests PT ON VANC   Final    Gram Stain     Final    Value: NO WBC SEEN     NO SQUAMOUS EPITHELIAL CELLS SEEN     MODERATE GRAM NEGATIVE RODS     FEW GRAM POSITIVE COCCI     IN PAIRS   Culture     Final    Value: NO ANAEROBES ISOLATED; CULTURE IN PROGRESS FOR 5 DAYS   Report Status PENDING   Incomplete   MRSA PCR SCREENING     Status: Normal   Collection Time   08/12/11  5:15 PM      Component Value Range Status Comment   MRSA by PCR NEGATIVE  NEGATIVE  Final   CULTURE, RESPIRATORY     Status: Normal (Preliminary result)   Collection Time   08/12/11  6:00 PM      Component Value Range Status Comment   Specimen Description ENDOTRACHEAL   Final    Special Requests NONE   Final    Gram Stain     Final    Value: RARE WBC PRESENT,BOTH PMN AND MONONUCLEAR     NO SQUAMOUS EPITHELIAL CELLS SEEN     NO ORGANISMS SEEN   Culture Non-Pathogenic Oropharyngeal-type Flora Isolated.   Final    Report Status PENDING   Incomplete     Anti-infectives     Start     Dose/Rate Route Frequency Ordered Stop   08/13/11 0430   vancomycin (VANCOCIN) IVPB 1000 mg/200 mL premix        1,000 mg 200 mL/hr over 60 Minutes Intravenous Every 12 hours 08/12/11 1724     08/13/11 0400   vancomycin (VANCOCIN) IVPB 1000 mg/200 mL premix  Status:  Discontinued        1,000 mg 200 mL/hr over 60 Minutes Intravenous Every 12 hours 08/12/11 1520 08/12/11 1724   08/12/11 2100  piperacillin-tazobactam (ZOSYN) IVPB 3.375 g       3.375 g 12.5 mL/hr over 240 Minutes Intravenous 3 times per day 08/12/11 1725     08/12/11 2000   clindamycin (CLEOCIN) IVPB 900 mg        900 mg 100 mL/hr over 30 Minutes Intravenous 3 times per day 08/12/11 1725     08/12/11 1800   clindamycin (CLEOCIN) IVPB 900 mg   Status:  Discontinued        900 mg 100 mL/hr over 30 Minutes Intravenous 4 times per day 08/12/11 1508 08/12/11 1509   08/12/11 1800   piperacillin-tazobactam (ZOSYN) IVPB 3.375 g  Status:  Discontinued  3.375 g 12.5 mL/hr over 240 Minutes Intravenous 4 times per day 08/12/11 1508 08/12/11 1508   08/12/11 1623   polymyxin B 500,000 Units, bacitracin 50,000 Units in sodium chloride irrigation 0.9 % 500 mL irrigation  Status:  Discontinued          As needed 08/12/11 1624 08/12/11 1656   08/12/11 1515   vancomycin (VANCOCIN) IVPB 1000 mg/200 mL premix     Comments: Please send to OR (tube station 27)      1,000 mg 200 mL/hr over 60 Minutes Intravenous  Once 08/12/11 1507 08/12/11 1825   08/12/11 1330   vancomycin (VANCOCIN) IVPB 1000 mg/200 mL premix        1,000 mg 200 mL/hr over 60 Minutes Intravenous  Once 08/12/11 1316 08/12/11 1457   08/12/11 1300  piperacillin-tazobactam (ZOSYN) IVPB 3.375 g       3.375 g 12.5 mL/hr over 240 Minutes Intravenous  Once 08/12/11 1248 08/12/11 [place] 08/14/2011,1:04 PM

## 2011-08-14 NOTE — Progress Notes (Signed)
Pt  Taken off isolation/contact precautions per infection prevention and Dr. Lamonte Sakai. Cultures negative. MRSA negative.

## 2011-08-14 NOTE — Progress Notes (Signed)
Pt taken to OR by OR tech, Respiratory Therapist, and RN without complications. Report given to CRNA. [place] notified.

## 2011-08-14 NOTE — Progress Notes (Signed)
Name: Edward [name] MRN: [id number] DOB: [birth year]    LOS: 2  PCCM ADMISSION NOTE  History of Present Illness: 40 year old male with history of diabetes. Reported to the ER today with increased pain in his perineum and fever. Fever started early this morning. No nausea or emesis. Work-up reveals leukocytosis and crepitus in the perineum.  Urology saw patient and decided to take to the OR for debridement    Lines / Drains: PIV ET tube 12/24>>>  Cultures: Blood 12/24>>> Sputum 12/24>>> Urine 12/24>>> Wound 12/24>>> GNR and GPC >>  Antibiotics: Clinda 12/24>>> Zosyn 12/24>>> Vancomycin 12/24>>>  Tests / Events: 12/24 to OR for debridement.  Subjective: Hemodynamically stable after debridement Planning for return to OR today w Dr Jasmine December  Vital Signs: Filed Vitals:   08/14/11 0900  BP: 100/56  Pulse: 96  Temp:   Resp: 14    Intake/Output Summary (Last 24 hours) at 08/14/11 1048 Last data filed at 08/14/11 1000  Gross per 24 hour  Intake   5003 ml  Output   1025 ml  Net   3978 ml    Physical Examination: General: No distress Neuro:  intubated and sedated.   HEENT:  [place]/AT, PERRL, EOM-I and moist mucous membranes. Neck:  Supple, -LAN and -thyromegally.   Cardiovascular:  RRR, Nl S1/S2, -M/R/G. Lungs:  CTA bilaterally. Abdomen:  Soft, tender suprapubically, ND and +BS. Musculoskeletal:  -edema and -tenderness. Skin:  WNL  Labs and Imaging:   CBC    Component Value Date/Time   WBC 11.6* 08/14/2011 0520   RBC 3.73* 08/14/2011 0520   HGB 10.6* 08/14/2011 0520   HCT 31.6* 08/14/2011 0520   PLT 178 08/14/2011 0520   MCV 84.7 08/14/2011 0520   MCH 28.4 08/14/2011 0520   MCHC 33.5 08/14/2011 0520   RDW 12.8 08/14/2011 0520   LYMPHSABS 0.6* 08/12/2011 1044   MONOABS 1.8* 08/12/2011 1044   EOSABS 0.0 08/12/2011 1044   BASOSABS 0.0 08/12/2011 1044   BMET    Component Value Date/Time   NA 137 08/14/2011 0520   K 3.5 08/14/2011 0520   CL 108 08/14/2011  0520   CO2 19 08/14/2011 0520   GLUCOSE 174* 08/14/2011 0520   BUN 19 08/14/2011 0520   CREATININE 1.25 08/14/2011 0520   CALCIUM 8.0* 08/14/2011 0520   GFRNONAA 70* 08/14/2011 0520   GFRAA 81* 08/14/2011 0520    Lab 08/14/11 0520  MG 2.0   Lab Results  Component Value Date   CALCIUM 8.0* 08/14/2011   PHOS 2.9 08/14/2011    Assessment and Plan: 27 M with fournier's gangrene, urology evaluated the patient and called PCCM to admit as they will keep the patient sedated and intubated post procedure since he will likely numerous OR visits.  Patient went to OR, testicles placed in thigh and will need reconstructive surgery once more stable.    VDRF:  - vent support to allow pain control and possibly return trips to the [place] consider early trach if we believe he will require serial trips to the OR - pulm hygiene  fournier's gangrene: - vanco/zosyn/clinda - follow cx's - further debridement 12/26 per urology recs  Acute renal failure  Lab 08/14/11 0520 08/13/11 1545 08/13/11 0450 08/12/11 1044  CREATININE 1.25 1.33 1.43* 1.08  - follow BMP - IVF's  FEN: - start TF's after debridement if OK with Dr Jasmine December - do not want to increase of contamination of wound  Baltazar Apo, MD, PhD 08/14/2011, 10:48 AM [place]  [facility] [phone] or if no answer [phone]

## 2011-08-14 NOTE — Anesthesia Postprocedure Evaluation (Signed)
  Anesthesia Post-op Note  Patient: Edward [name]  Procedure(s) Performed:  MINOR INCISION AND DRAINAGE OF ABSCESS - Perineal Wound Debridement;Placement Bilateral Testicular Thigh Pouches  Patient Location: ICU  Anesthesia Type: General  Level of Consciousness: sedated  Airway and Oxygen Therapy: Patient remains intubated per anesthesia plan and Patient placed on Ventilator (see vital sign flow sheet for setting)  Post-op Pain: none  Post-op Assessment: Post-op Vital signs reviewed, Patient's Cardiovascular Status Stable, Respiratory Function Stable, Patent Airway and Pain level controlled, remains ventilated   Post-op Vital Signs: Reviewed and stable  Complications: No apparent anesthesia complications

## 2011-08-14 NOTE — Progress Notes (Signed)
INITIAL ADULT NUTRITION ASSESSMENT Date: 08/14/2011   Time: 12:35 PM Reason for Assessment: Tube Feeding Recommendations   ASSESSMENT: Male 40 y.o. Patient with OG tube, currently NPO. RN stated patient not getting propofol at this time.   Recommend initiate tube feeding via OG tube if remains NPO.  Dx: <principal problem not specified>  Hx:  Past Medical History  Diagnosis Date  . Diabetes mellitus   . Hypertension    Related Meds:  Scheduled Meds:   . antiseptic oral rinse  1 application Mouth Rinse QID  . chlorhexidine  15 mL Mouth/Throat BID  . clindamycin (CLEOCIN) IV  900 mg Intravenous Q8H  . insulin aspart  0-7 Units Subcutaneous Q4H  . insulin glargine  20 Units Subcutaneous Q24H  . magnesium sulfate 1 - 4 g bolus IVPB  1 g Intravenous Once  . pantoprazole (PROTONIX) IV  40 mg Intravenous Q24H  . piperacillin-tazobactam (ZOSYN)  IV  3.375 g Intravenous Q8H  . potassium chloride  10 mEq Intravenous Q1 Hr x 3  . potassium chloride      . sodium chloride  500 mL Intravenous Once  . sodium chloride      . sodium chloride      . vancomycin  1,000 mg Intravenous Q12H  . DISCONTD: magnesium sulfate LVP 250-500 ml  1 g Intravenous Once   Continuous Infusions:   . sodium chloride 150 mL/hr at 08/13/11 2123  . dextrose 20 mL/hr (08/13/11 0246)  . fentaNYL infusion INTRAVENOUS 100 mcg/hr (08/13/11 1702)  . midazolam (VERSED) infusion 1 mg/hr (08/14/11 0254)  . propofol 20 mcg/kg/min (08/13/11 0200)   PRN Meds:.acetaminophen (TYLENOL) oral liquid 160 mg/5 mL, insulin aspart  Ht: 6\' 1"  (185.4 cm)  Wt: 256 lb 9.9 oz (116.4 kg)  Ideal Wt: 83.6 kg % Ideal Wt: 139%  Body mass index is 33.86 kg/(m^2).  Labs:  CMP     Component Value Date/Time   NA 137 08/14/2011 0520   K 3.5 08/14/2011 0520   CL 108 08/14/2011 0520   CO2 19 08/14/2011 0520   GLUCOSE 174* 08/14/2011 0520   BUN 19 08/14/2011 0520   CREATININE 1.25 08/14/2011 0520   CALCIUM 8.0* 08/14/2011 0520    GFRNONAA 70* 08/14/2011 0520   GFRAA 81* 08/14/2011 0520    Intake/Output Summary (Last 24 hours) at 08/14/11 1254 Last data filed at 08/14/11 1200  Gross per 24 hour  Intake   4742 ml  Output    940 ml  Net   3802 ml    Diet Order: NPO  Supplements/Tube Feeding: none at this time  IVF:    sodium chloride Last Rate: 150 mL/hr at 08/13/11 2123  dextrose Last Rate: 20 mL/hr (08/13/11 0246)  fentaNYL infusion INTRAVENOUS Last Rate: 100 mcg/hr (08/13/11 1702)  midazolam (VERSED) infusion Last Rate: 1 mg/hr (08/14/11 0254)  propofol Last Rate: 20 mcg/kg/min (08/13/11 0200)    Estimated Nutritional Needs:   Kcal: 2000-2200 kcal Protein: 100-139 grams Fluid: 67mL per kcal  NUTRITION DIAGNOSIS: -Inadequate oral intake (NI-2.1).  Status: Ongoing  RELATED TO: NPO  AS EVIDENCE BY: sedation, intubation, and for debridement procedure in OR    MONITORING/EVALUATION(Goals): Weight, labs, tube feeding tolerance, skin  GOALS: 1. Initiate TF via OG tubeas able  if patient remains NPO 2. Meet greater than or equal to 90% of estimated nutrition needs.   EDUCATION NEEDS: -Education not appropriate at this time  INTERVENTION: 1. Initiate TF via OG tube of Glucerna 1.2 at 75 mL/hr. Provides  daily total: 1800 mL, 2160 kcal, 108 grams of protein, and 1440 mL free water. (2160kcal and 108 kcal meets 100% of estimated kcal needs)  Consider MVI and additional protein for healing post-procedure.   DOCUMENTATION CODES Per approved criteria  -Obesity Unspecified    Loyce Dys [facility] 08/14/2011, 12:35 PM

## 2011-08-14 NOTE — Transfer of Care (Signed)
Immediate Anesthesia Transfer of Care Note  Patient: Edward [name]  Procedure(s) Performed:  MINOR INCISION AND DRAINAGE OF ABSCESS - Perineal Wound Debridement;Placement Bilateral Testicular Thigh Pouches  Patient Location: PACU and SICU  Anesthesia Type: General  Level of Consciousness: sedated and unresponsive  Airway & Oxygen Therapy: Patient remains intubated per anesthesia plan  Post-op Assessment: Report given to PACU RN and Post -op Vital signs reviewed and stable  Post vital signs: Reviewed and stable  Complications: No apparent anesthesia complications

## 2011-08-14 NOTE — Preoperative (Signed)
Beta Blockers   Reason not to administer Beta Blockers:Not Applicable. No home beta blockers 

## 2011-08-14 NOTE — Brief Op Note (Addendum)
08/12/2011 - 08/14/2011  3:33 PM  PATIENT:  Edward [name]  40 y.o. male  PRE-OPERATIVE DIAGNOSIS:  Perineal Gangrene  POST-OPERATIVE DIAGNOSIS: Perineal gangrene  PROCEDURE:  Procedure(s): Wound debridement. Wash-out of wound. Creation of bilateral thigh pouches for testicles. Wound dressing.  SURGEON:  Surgeon(s): Molli Hazard, MD  PHYSICIAN ASSISTANT: Judeth Horn, MD (Visually inspected wound but did not operate.)  ASSISTANTS: none   ANESTHESIA:   general  EBL:  Total I/O In: 1417.5 [I.V.:1355; IV Piggyback:62.5] Out: 355 [Urine:355]  BLOOD ADMINISTERED:none  DRAINS: Urinary Catheter (Foley)   LOCAL MEDICATIONS USED:  NONE  SPECIMEN:  No Specimen  DISPOSITION OF SPECIMEN:  N/A  COUNTS:  YES  TOURNIQUET:  * No tourniquets in log *  DICTATION: .Other Dictation: Dictation Number [id number]  PLAN OF CARE: return to ICU intubated.  PATIENT DISPOSITION:  ICU - intubated and hemodynamically stable.   Delay start of Pharmacological VTE agent (>24hrs) due to surgical blood loss or risk of bleeding: No.  Ok to begin weaning off vent from urology point of view.  ADDENDUM:  _______Excisional Debridement - cutting away necrotic, devitalzed tissue or slough to the level of viable tissue using a sharp instrument (i.e. scalpel, scissors, etc.)  ___X____Non Excisional Debridement - the removal of necrotic, decitialized tissue or slough by means of scraping, mechanical brushing, flushing, or washing (i.e. irrigation; whirlpool); minor removal of loose fragments  _______Other ________________________________________________________  Instrument Used:  ____X___Scissors _______Scalpel _______Curette _______Other _______Pulse-vac_________________________________________________    Depth of Debridement: _______Partial Thickness _______Full Thickness ___x____Skin and Subcutaneous Tissue _______Subcutaneous Tissue and Muscle _______Subcutaneous Tissue, Muscle  and Bone _______Other ________________________________________________________    Size _____Less than 5 cm___________________________________________________________

## 2011-08-14 NOTE — Anesthesia Preprocedure Evaluation (Signed)
Anesthesia Evaluation  Patient identified by MRN, date of birth, ID band Patient awake    Reviewed: Allergy & Precautions, H&P , NPO status , Patient's Chart, lab work & pertinent test results  Airway       Dental   Pulmonary    Pulmonary exam normal       Cardiovascular hypertension, Regular Tachycardia    Neuro/Psych    GI/Hepatic   Endo/Other  Diabetes mellitus-  Renal/GU      Musculoskeletal   Abdominal   Peds  Hematology   Anesthesia Other Findings oet in place  Reproductive/Obstetrics                           Anesthesia Physical Anesthesia Plan  ASA: III  Anesthesia Plan: General   Post-op Pain Management:    Induction: Intravenous  Airway Management Planned: Oral ETT  Additional Equipment:   Intra-op Plan:   Post-operative Plan: Post-operative intubation/ventilation  Informed Consent: I have reviewed the patients History and Physical, chart, labs and discussed the procedure including the risks, benefits and alternatives for the proposed anesthesia with the patient or authorized representative who has indicated his/her understanding and acceptance.   History available from chart only  Plan Discussed with: CRNA and Surgeon  Anesthesia Plan Comments:         Anesthesia Quick Evaluation

## 2011-08-14 NOTE — Progress Notes (Signed)
Urology Progress Note  Subjective:     No acute urologic events. Patient remains intubated and sedated.  Objective:  Patient Vitals for the past 24 hrs:  BP Temp Temp src Pulse Resp SpO2  08/14/11 0543 101/61 mmHg - - 90  18  98 %  08/14/11 0400 97/61 mmHg - - 94  18  98 %  08/14/11 0300 101/66 mmHg - - 97  16  91 %  08/14/11 0200 97/64 mmHg - - 90  18  99 %  08/14/11 0100 98/61 mmHg - - 90  18  99 %  08/14/11 0000 106/71 mmHg - - 93  18  100 %  08/13/11 2344 109/73 mmHg - - 93  18  100 %  08/13/11 2300 107/69 mmHg - - 91  17  100 %  08/13/11 2200 106/69 mmHg - - 92  18  100 %  08/13/11 2111 105/66 mmHg - - 95  18  100 %  08/13/11 2100 105/66 mmHg - - 96  18  100 %  08/13/11 2000 89/60 mmHg 99.8 F (37.7 C) Oral 93  18  99 %  08/13/11 1900 106/62 mmHg - - 94  18  99 %  08/13/11 1800 102/63 mmHg - - 96  18  99 %  08/13/11 1700 96/57 mmHg - - 96  18  99 %  08/13/11 1600 107/64 mmHg 99 F (37.2 C) Oral 100  18  99 %  08/13/11 1530 106/65 mmHg - - 99  18  100 %  08/13/11 1500 104/67 mmHg - - 101  18  100 %  08/13/11 1445 104/62 mmHg - - 104  18  100 %  08/13/11 1430 118/69 mmHg - - 102  18  100 %  08/13/11 1400 102/64 mmHg - - 97  18  100 %  08/13/11 1300 100/65 mmHg - - 99  18  100 %  08/13/11 1200 103/60 mmHg 100.2 F (37.9 C) Oral 100  18  100 %  08/13/11 1100 103/68 mmHg - - 102  18  100 %  08/13/11 1000 100/65 mmHg - - 100  18  100 %  08/13/11 0900 100/65 mmHg - - 104  18  100 %  08/13/11 0800 106/68 mmHg 99 F (37.2 C) Oral 103  18  100 %  08/13/11 0700 104/65 mmHg - - 105  18  99 %    Physical Exam: General:  No acute distress, awake Cardiovascular:    [ x ]  S1/S2 present, RRR  [  ]  Irregularly irregular Chest:  CTA-B Abdomen:               [  ] Soft, appropriately TTP  [ x ] Soft, NTTP, no abdominal crepitus.   [  ] Soft, appropriately TTP, incision(s) clean/dry/intact  Genitourinary:  Penis with good viable tissue without crepitus.  Edges of wound clean  without necrosis. Testicles viable bilaterally. Foley:  Draining concentrated urine.    I/O last 3 completed shifts: In: 8898.7 [I.V.:5648.7; IV Piggyback:3250] Out: 1990 [id number]; Other:250; Blood:150]     Assessment: Fournier gangrene of the perineum, scrotum, and anal triangle. Plan: -Wound dressing changed today. -May need more debridement in anal triangle. -Recommend return to OR today for wound debridement, creation of bilateral testicular thigh pouches.     -Obtained informed consent from patient's mother, his next of kin, as he is intubated and sedated.  We discussed the risks, benefits, alternatives, and likelihood of achieving goals.  Rolan Bucco, MD [phone]

## 2011-08-14 NOTE — Progress Notes (Signed)
ANTIBIOTIC CONSULT NOTE - FOLLOW UP  Pharmacy Consult for Vancomycin Indication: Fournier's gangrene   Assessment: 58yoM s/p I&D being treated with vancomycin for fournier's gangrene. Vancomycin trough (9.9 mcg/ml) today after being drawn approximately 3 hours early, so true trough would be lower. Tmax 99.8'F, WBC 11.6, CrCl ~104 ml/min, 12/24 UCx pending, 12/24 Trach Cx pending, 12/24 Abscess Cx = GPC, GNR, 12/24 blood x2 pending. Vancomycin trough sub-thereapuic at current dose of Vancomycin 1000mg  IV Q12 hours.   Goal of Therapy: Vancomycin trough 15-20 mcg/mL  Plan:  1) Increase Vancomycin to 1750mg  IV Q12 hours. 2) Continue to monitor renal function and clinical improvement 3) Recheck trough at Diego Cory, PharmD     Pager [phone] 08/14/2011   4:40 PM  -------------------------------  No Known Allergies  Patient Measurements: Height: 6\' 1"  (185.4 cm) Weight: 256 lb 9.9 oz (116.4 kg) IBW/kg (Calculated) : 79.9   Vital Signs: Temp: 99.2 F (37.3 C) (12/26 1222) Temp src: Axillary (12/26 1222) BP: 112/68 mmHg (12/26 1600) Pulse Rate: 95  (12/26 1400) Intake/Output from previous day: 12/25 0701 - 12/26 0700 In: 5228 [I.V.:3103; IV Piggyback:2125] Out: [id number] [Urine:1065] Intake/Output from this shift: Total I/O In: 1603.5 [I.V.:1516; IV Piggyback:87.5] Out: 400 [Urine:400]  Labs:  [facility] 08/14/11 0520 08/13/11 1545 08/13/11 0450 08/12/11 1044  WBC 11.6* -- 13.3* 19.8*  HGB 10.6* -- 11.4* 15.3  PLT 178 -- 165 222  LABCREA -- -- -- --  CREATININE 1.25 1.33 1.43* --   Estimated Creatinine Clearance: 104 ml/min (by C-G formula based on Cr of 1.25).  Basename 08/14/11 1342  VANCOTROUGH 9.9*  VANCOPEAK --  Jake Michaelis --  GENTTROUGH --  GENTPEAK --  GENTRANDOM --  TOBRATROUGH --  TOBRAPEAK --  TOBRARND --  AMIKACINPEAK --  AMIKACINTROU --  AMIKACIN --     Microbiology: Recent Results (from the past 720 hour(s))  CULTURE, BLOOD (ROUTINE X  2)     Status: Normal (Preliminary result)   Collection Time   08/12/11 10:50 AM      Component Value Range Status Comment   Specimen Description BLOOD LEFT ARM   Final    Special Requests BOTTLES DRAWN AEROBIC AND ANAEROBIC 4CC   Final    Setup Time [id number]   Final    Culture     Final    Value:        BLOOD CULTURE RECEIVED NO GROWTH TO DATE CULTURE WILL BE HELD FOR 5 DAYS BEFORE ISSUING A FINAL NEGATIVE REPORT   Report Status PENDING   Incomplete   CULTURE, BLOOD (ROUTINE X 2)     Status: Normal (Preliminary result)   Collection Time   08/12/11 10:54 AM      Component Value Range Status Comment   Specimen Description BLOOD RIGHT ARM   Final    Special Requests BOTTLES DRAWN AEROBIC AND ANAEROBIC 10CC   Final    Setup Time [id number]   Final    Culture     Final    Value:        BLOOD CULTURE RECEIVED NO GROWTH TO DATE CULTURE WILL BE HELD FOR 5 DAYS BEFORE ISSUING A FINAL NEGATIVE REPORT   Report Status PENDING   Incomplete   CULTURE, ROUTINE-ABSCESS     Status: Normal (Preliminary result)   Collection Time   08/12/11  5:11 PM      Component Value Range Status Comment   Specimen Description ABSCESS PERINEAL   Final    Special Requests PT ON  VANC   Final    Gram Stain     Final    Value: NO WBC SEEN     NO SQUAMOUS EPITHELIAL CELLS SEEN     MODERATE GRAM NEGATIVE RODS     FEW GRAM POSITIVE COCCI     IN PAIRS   Culture Culture reincubated for better growth   Final    Report Status PENDING   Incomplete   ANAEROBIC CULTURE     Status: Normal (Preliminary result)   Collection Time   08/12/11  5:11 PM      Component Value Range Status Comment   Specimen Description ABSCESS PERINEAL   Final    Special Requests PT ON VANC   Final    Gram Stain     Final    Value: NO WBC SEEN     NO SQUAMOUS EPITHELIAL CELLS SEEN     MODERATE GRAM NEGATIVE RODS     FEW GRAM POSITIVE COCCI     IN PAIRS   Culture     Final    Value: NO ANAEROBES ISOLATED; CULTURE IN PROGRESS FOR 5 DAYS     Report Status PENDING   Incomplete   MRSA PCR SCREENING     Status: Normal   Collection Time   08/12/11  5:15 PM      Component Value Range Status Comment   MRSA by PCR NEGATIVE  NEGATIVE  Final   CULTURE, RESPIRATORY     Status: Normal (Preliminary result)   Collection Time   08/12/11  6:00 PM      Component Value Range Status Comment   Specimen Description ENDOTRACHEAL   Final    Special Requests NONE   Final    Gram Stain     Final    Value: RARE WBC PRESENT,BOTH PMN AND MONONUCLEAR     NO SQUAMOUS EPITHELIAL CELLS SEEN     NO ORGANISMS SEEN   Culture Non-Pathogenic Oropharyngeal-type Flora Isolated.   Final    Report Status PENDING   Incomplete   URINE CULTURE     Status: Normal   Collection Time   08/12/11  6:42 PM      Component Value Range Status Comment   Specimen Description URINE, CLEAN CATCH   Final    Special Requests NONE   Final    Setup Time [id number]   Final    Colony Count NO GROWTH   Final    Culture NO GROWTH   Final    Report Status 08/14/2011 FINAL   Final     Anti-infectives     Start     Dose/Rate Route Frequency Ordered Stop   08/14/11 1452   polymyxin B 500,000 Units, bacitracin 50,000 Units in sodium chloride irrigation 0.9 % 500 mL irrigation  Status:  Discontinued          As needed 08/14/11 1452 08/14/11 1555   08/13/11 0430   vancomycin (VANCOCIN) IVPB 1000 mg/200 mL premix        1,000 mg 200 mL/hr over 60 Minutes Intravenous Every 12 hours 08/12/11 1724     08/13/11 0400   vancomycin (VANCOCIN) IVPB 1000 mg/200 mL premix  Status:  Discontinued        1,000 mg 200 mL/hr over 60 Minutes Intravenous Every 12 hours 08/12/11 1520 08/12/11 1724   08/12/11 2100  piperacillin-tazobactam (ZOSYN) IVPB 3.375 g       3.375 g 12.5 mL/hr over 240 Minutes Intravenous 3 times per day 08/12/11 1725  08/12/11 2000   clindamycin (CLEOCIN) IVPB 900 mg        900 mg 100 mL/hr over 30 Minutes Intravenous 3 times per day 08/12/11 1725     08/12/11  1800   clindamycin (CLEOCIN) IVPB 900 mg  Status:  Discontinued        900 mg 100 mL/hr over 30 Minutes Intravenous 4 times per day 08/12/11 1508 08/12/11 1509   08/12/11 1800   piperacillin-tazobactam (ZOSYN) IVPB 3.375 g  Status:  Discontinued        3.375 g 12.5 mL/hr over 240 Minutes Intravenous 4 times per day 08/12/11 1508 08/12/11 1508   08/12/11 1623   polymyxin B 500,000 Units, bacitracin 50,000 Units in sodium chloride irrigation 0.9 % 500 mL irrigation  Status:  Discontinued          As needed 08/12/11 1624 08/12/11 1656   08/12/11 1515   vancomycin (VANCOCIN) IVPB 1000 mg/200 mL premix     Comments: Please send to OR (tube station 27)      1,000 mg 200 mL/hr over 60 Minutes Intravenous  Once 08/12/11 1507 08/12/11 1825   08/12/11 1330   vancomycin (VANCOCIN) IVPB 1000 mg/200 mL premix        1,000 mg 200 mL/hr over 60 Minutes Intravenous  Once 08/12/11 1316 08/12/11 1457   08/12/11 1300  piperacillin-tazobactam (ZOSYN) IVPB 3.375 g       3.375 g 12.5 mL/hr over 240 Minutes Intravenous  Once 08/12/11 1248 08/12/11 1656

## 2011-08-15 ENCOUNTER — Inpatient Hospital Stay (HOSPITAL_COMMUNITY): Payer: 59

## 2011-08-15 ENCOUNTER — Encounter (HOSPITAL_COMMUNITY): Payer: Self-pay | Admitting: Urology

## 2011-08-15 DIAGNOSIS — M726 Necrotizing fasciitis: Secondary | ICD-10-CM

## 2011-08-15 DIAGNOSIS — A419 Sepsis, unspecified organism: Secondary | ICD-10-CM

## 2011-08-15 DIAGNOSIS — J9600 Acute respiratory failure, unspecified whether with hypoxia or hypercapnia: Secondary | ICD-10-CM

## 2011-08-15 LAB — BASIC METABOLIC PANEL WITH GFR
BUN: 15 mg/dL (ref 6–23)
CO2: 18 meq/L — ABNORMAL LOW (ref 19–32)
Calcium: 7.6 mg/dL — ABNORMAL LOW (ref 8.4–10.5)
Chloride: 110 meq/L (ref 96–112)
Creatinine, Ser: 1.16 mg/dL (ref 0.50–1.35)
GFR calc Af Amer: 89 mL/min — ABNORMAL LOW (ref >90)
GFR calc non Af Amer: 77 mL/min — ABNORMAL LOW (ref >90)
Glucose, Bld: 156 mg/dL — ABNORMAL HIGH (ref 70–99)
Potassium: 3.3 meq/L — ABNORMAL LOW (ref 3.5–5.1)
Sodium: 139 meq/L (ref 135–145)

## 2011-08-15 LAB — CBC
HCT: 32.4 % — ABNORMAL LOW (ref 39.0–52.0)
Hemoglobin: 10.7 g/dL — ABNORMAL LOW (ref 13.0–17.0)
MCH: 28 pg (ref 26.0–34.0)
MCHC: 33 g/dL (ref 30.0–36.0)
MCV: 84.8 fL (ref 78.0–100.0)

## 2011-08-15 LAB — CULTURE, RESPIRATORY W GRAM STAIN

## 2011-08-15 LAB — GLUCOSE, CAPILLARY
Glucose-Capillary: 172 mg/dL — ABNORMAL HIGH (ref 70–99)
Glucose-Capillary: 144 mg/dL — ABNORMAL HIGH (ref 70–99)
Glucose-Capillary: 135 mg/dL — ABNORMAL HIGH (ref 70–99)
Glucose-Capillary: 168 mg/dL — ABNORMAL HIGH (ref 70–99)

## 2011-08-15 LAB — CULTURE, ROUTINE-ABSCESS: Gram Stain: NONE SEEN

## 2011-08-15 LAB — PROCALCITONIN: Procalcitonin: 3.64 ng/mL

## 2011-08-15 MED ORDER — POTASSIUM CHLORIDE 10 MEQ/100ML IV SOLN
10.0000 meq | INTRAVENOUS | Status: AC
  Administered 2011-08-15 (×4): 10 meq via INTRAVENOUS
  Filled 2011-08-15 (×3): qty 100

## 2011-08-15 MED ORDER — SODIUM CHLORIDE 0.9 % IJ SOLN
INTRAMUSCULAR | Status: AC
  Administered 2011-08-15: 14:00:00
  Filled 2011-08-15: qty 10

## 2011-08-15 MED ORDER — FENTANYL CITRATE 0.05 MG/ML IJ SOLN
25.0000 ug | INTRAMUSCULAR | Status: DC | PRN
  Administered 2011-08-15: 25 ug via INTRAVENOUS
  Administered 2011-08-15 – 2011-08-16 (×3): 50 ug via INTRAVENOUS
  Administered 2011-08-16: 25 ug via INTRAVENOUS
  Administered 2011-08-17 – 2011-08-20 (×11): 50 ug via INTRAVENOUS
  Administered 2011-08-20: 04:00:00 via INTRAVENOUS
  Administered 2011-08-20: 50 ug via INTRAVENOUS
  Filled 2011-08-15 (×19): qty 2

## 2011-08-15 MED ORDER — POTASSIUM CHLORIDE 10 MEQ/100ML IV SOLN
INTRAVENOUS | Status: AC
  Administered 2011-08-15: 10 meq via INTRAVENOUS
  Filled 2011-08-15: qty 100

## 2011-08-15 MED ORDER — SODIUM CHLORIDE 0.9 % IJ SOLN
INTRAMUSCULAR | Status: AC
  Administered 2011-08-15: 10 mL
  Filled 2011-08-15: qty 20

## 2011-08-15 MED ORDER — POTASSIUM CHLORIDE 10 MEQ/50ML IV SOLN: 10.0000 meq | INTRAVENOUS | Status: DC

## 2011-08-15 NOTE — Progress Notes (Signed)
Edward [name]/Edward [name] Progress Note 1 Day Post-Op  Subjective: I was able to look at the patient's wound yesterday in the OR when Dr. Jasmine December took him back to the OR.  Minimal peri-rectal or peri-anal involvement.  Objective: Vital signs in last 24 hours: Temp:  [98.9 F (37.2 C)-101.3 F (38.5 C)] 98.9 F (37.2 C) (12/27 0847) Pulse Rate:  [89-101] 95  (12/27 0800) Resp:  [14-23] 23  (12/27 0800) BP: (100-170)/(56-109) 116/75 mmHg (12/27 0800) SpO2:  [95 %-100 %] 97 % (12/27 0800) FiO2 (%):  [30 %-30.5 %] 30 % (12/27 0800) Weight:  [261 lb 0.4 oz (118.4 kg)] 261 lb 0.4 oz (118.4 kg) (12/27 0500)    Intake/Output from previous day: 12/26 0701 - 12/27 0700 In: 4770 [I.V.:3770; IV Piggyback:1000] Out: 1125 [Urine:1125] Intake/Output this shift: Total I/O In: 170 [I.V.:157.5; IV Piggyback:12.5] Out: 45 [Urine:45]  General: Patient just extubated, a little soft spoken.  Lungs: Clear.  Abd: Benign  Extremities: Did not examine wound today yet  Neuro: Intact  Lab Results:  @LABLAST2 (wbc:2,hgb:2,hct:2,plt:2) BMET  [facility] 08/15/11 0355 08/14/11 0520  NA 139 137  K 3.3* 3.5  CL 110 108  CO2 18* 19  GLUCOSE 156* 174*  BUN 15 19  CREATININE 1.16 1.25  CALCIUM 7.6* 8.0*   PT/INR  Basename 08/12/11 1746  LABPROT 18.0*  INR 1.46   ABG  Basename 08/14/11 0557 08/13/11 0415  PHART 7.403 7.355  HCO3 19.0* 20.2    Studies/Results: Dg Chest Port 1 View  08/15/2011  *RADIOLOGY REPORT*  Clinical Data: Evaluate endotracheal tube and lines.  PORTABLE CHEST - 1 VIEW  Comparison: 08/14/2011.  Findings: Endotracheal tube is in satisfactory position. Nasogastric tube is followed into the stomach.  Heart size stable. There is bilateral perihilar and left basilar airspace disease.  No definite pleural fluid.  IMPRESSION: Bilateral perihilar and left basilar airspace disease.  Original Report Authenticated By: Luretha Rued, M.D.   Dg Chest Port 1 View  08/14/2011  *RADIOLOGY  REPORT*  Clinical Data: Check tube position.  PORTABLE CHEST - 1 VIEW  Comparison: 08/13/2011  Findings: Endotracheal tube tip is positioned 2.9 cm proximal to the carina.  NG tube descends into the abdomen.  Cardiomegaly. Central vascular congestion.  Mild bibasilar opacities.  No pneumothorax or definite pleural effusion.  No acute osseous abnormality.  IMPRESSION: Cardiomegaly with central vascular congestion.  Bibasilar opacities; atelectasis versus infiltrate.  Endotracheal tube is appropriately positioned.  Original Report Authenticated By: Suanne Marker, M.D.    Anti-infectives: Anti-infectives     Start     Dose/Rate Route Frequency Ordered Stop   08/14/11 1730   vancomycin (VANCOCIN) 1,750 mg in sodium chloride 0.9 % 500 mL IVPB        1,750 mg 250 mL/hr over 120 Minutes Intravenous Every 12 hours 08/14/11 1647     08/14/11 1452   polymyxin B 500,000 Units, bacitracin 50,000 Units in sodium chloride irrigation 0.9 % 500 mL irrigation  Status:  Discontinued          As needed 08/14/11 1452 08/14/11 1555   08/13/11 0430   vancomycin (VANCOCIN) IVPB 1000 mg/200 mL premix  Status:  Discontinued        1,000 mg 200 mL/hr over 60 Minutes Intravenous Every 12 hours 08/12/11 1724 08/14/11 1647   08/13/11 0400   vancomycin (VANCOCIN) IVPB 1000 mg/200 mL premix  Status:  Discontinued        1,000 mg 200 mL/hr over 60 Minutes Intravenous Every 12  hours 08/12/11 1520 08/12/11 1724   08/12/11 2100  piperacillin-tazobactam (ZOSYN) IVPB 3.375 g       3.375 g 12.5 mL/hr over 240 Minutes Intravenous 3 times per day 08/12/11 1725     08/12/11 2000   clindamycin (CLEOCIN) IVPB 900 mg        900 mg 100 mL/hr over 30 Minutes Intravenous 3 times per day 08/12/11 1725     08/12/11 1800   clindamycin (CLEOCIN) IVPB 900 mg  Status:  Discontinued        900 mg 100 mL/hr over 30 Minutes Intravenous 4 times per day 08/12/11 1508 08/12/11 1509   08/12/11 1800   piperacillin-tazobactam (ZOSYN) IVPB  3.375 g  Status:  Discontinued        3.375 g 12.5 mL/hr over 240 Minutes Intravenous 4 times per day 08/12/11 1508 08/12/11 1508   08/12/11 1623   polymyxin B 500,000 Units, bacitracin 50,000 Units in sodium chloride irrigation 0.9 % 500 mL irrigation  Status:  Discontinued          As needed 08/12/11 1624 08/12/11 1656   08/12/11 1515   vancomycin (VANCOCIN) IVPB 1000 mg/200 mL premix     Comments: Please send to OR (tube station 27)      1,000 mg 200 mL/hr over 60 Minutes Intravenous  Once 08/12/11 1507 08/12/11 1825   08/12/11 1330   vancomycin (VANCOCIN) IVPB 1000 mg/200 mL premix        1,000 mg 200 mL/hr over 60 Minutes Intravenous  Once 08/12/11 1316 08/12/11 1457   08/12/11 1300  piperacillin-tazobactam (ZOSYN) IVPB 3.375 g       3.375 g 12.5 mL/hr over 240 Minutes Intravenous  Once 08/12/11 1248 08/12/11 1656          Assessment/Plan: s/p Procedure(s): MINOR INCISION AND DRAINAGE OF ABSCESS Wound care consultation if not already ordered.  Not likely to be able to use VAC.  LOS: 3 days   Kathryne Eriksson. Dahlia Bailiff, MD, FACS [phone] [facility] Surgery 08/15/2011

## 2011-08-15 NOTE — Progress Notes (Signed)
Pt given ice chips. Able to swallow well with no coughing. Pt able to take sips of water without complications. Carb modified diet ordered for lunch.

## 2011-08-15 NOTE — Progress Notes (Signed)
Pt extubated with no complications. Placed on 4 L [place]. O2 saturation 100%.

## 2011-08-15 NOTE — Progress Notes (Signed)
Physical Therapy Evaluation Patient Details Name: Edward [name] MRN: [id number] DOB: 21-Sep-1970 Today's Date: 08/15/2011  Problem List: There is no problem list on file for this patient.   Past Medical History:  Past Medical History  Diagnosis Date  . Diabetes mellitus   . Hypertension    Past Surgical History: History reviewed. No pertinent past surgical history.  PT Assessment/Plan/Recommendation PT Assessment Clinical Impression Statement: Pt with sepsis secondary to perirectal wound s/p I&D x 2 and VDRF. Pt with decreased mobility and problem list below in addition to medical course. Anticipate pt will progress quickly in order to return home with sons. Will follow to increase independence and decrease burden of care. Recommend daily mobility with nursing. PT Recommendation/Assessment: Patient will need skilled PT in the acute care venue PT Problem List: Decreased strength;Decreased activity tolerance;Decreased mobility;Decreased knowledge of use of DME;Pain;Decreased balance Barriers to Discharge: None PT Therapy Diagnosis : Difficulty walking;Abnormality of gait;Acute pain PT Plan PT Frequency: Min 3X/week PT Treatment/Interventions: DME instruction;Gait training;Functional mobility training;Therapeutic activities;Patient/family education;Balance training PT Recommendation Recommendations for Other Services: OT consult Follow Up Recommendations: Home health PT;24 hour supervision/assistance Equipment Recommended: Rolling walker with 5" wheels PT Goals  Acute Rehab PT Goals PT Goal Formulation: With patient Time For Goal Achievement: 2 weeks Pt will go Supine/Side to Sit: with modified independence;with HOB 0 degrees PT Goal: Supine/Side to Sit - Progress: Other (comment) Pt will go Sit to Supine/Side: with modified independence PT Goal: Sit to Supine/Side - Progress: Other (comment) Pt will go Sit to Stand: with supervision PT Goal: Sit to Stand - Progress: Other  (comment) Pt will go Stand to Sit: with supervision PT Goal: Stand to Sit - Progress: Other (comment) Pt will Ambulate: >150 feet;with supervision;with least restrictive assistive device PT Goal: Ambulate - Progress: Other (comment)  PT Evaluation Precautions/Restrictions  Precautions Precaution Comments: multiple IV Prior Functioning  Home Living Lives With: Son (2 teenage sons) Type of Home: House Home Layout: One level Home Access: Level entry Bathroom Shower/Tub: Multimedia programmer: Standard Home Adaptive Equipment: None Prior Function Level of Independence: Independent with basic ADLs;Independent with transfers;Independent with homemaking with ambulation;Independent with gait Driving: Yes Vocation: Full time employment 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 Pt with increased time for transfer secondary to scrotal pain Sitting - Scoot to Edge of Bed: 5: Supervision Sitting - Scoot to Edge of Bed Details (indicate cue type and reason): pt bridging to scoot secondary to wound Transfers Transfers: Yes Sit to Stand: 4: Min assist;From elevated surface;From bed Sit to Stand Details (indicate cue type and reason): cueing for hand placment and assist to stand. Pt with posterior LOB on initial stand Stand to Sit: 4: Min assist;To chair/3-in-1;With armrests Stand to Sit Details:  cueing for hand placement and sequence Ambulation/Gait Ambulation/Gait: Yes Ambulation/Gait Assistance: 4: Min assist Ambulation/Gait Assistance Details (indicate cue type and reason): Pt with decreased gait speed and posterior lean initially. +1 throughout secondary to 2 IV poles. Ambulation Distance (Feet): 120 Feet Assistive device: Rolling walker  Gait Pattern: Decreased stride length Stairs: No Wheelchair Mobility Wheelchair Mobility: No  Posture/Postural Control Posture/Postural Control: No significant limitations Balance Balance Assessed: Yes Static Sitting Balance Static Sitting - Balance Support: Feet supported;Bilateral upper extremity supported Dynamic Standing Balance Dynamic Standing - Level of Assistance: 4: Min assist Exercise    End of Session PT - End of Session Equipment Utilized During Treatment: Gait belt Activity Tolerance: Patient tolerated treatment well Patient left: in chair;with call bell in reach Nurse Communication: Mobility status for transfers;Mobility status for ambulation General Behavior During Session: [facility] for tasks performed Cognition: [facility] for tasks performed  Melford Aase 08/15/2011, 3:27 PM  Lanetta Inch, [place]

## 2011-08-15 NOTE — Procedures (Signed)
Extubation Procedure Note  Patient Details:   Name: Edward [name] DOB: 05/01/1971 MRN: [id number]   Airway Documentation:     Evaluation  O2 sats: stable throughout Complications: No apparent complications Patient did tolerate procedure well. Bilateral Breath Sounds: Clear Suctioning: Airway Yes  Cordella Register 08/15/2011, 9:06 AM

## 2011-08-15 NOTE — Op Note (Signed)
NAME:  Edward [name], Edward [name] NO.:  MEDICAL RECORD NO.:  [id number]  LOCATION:                                 FACILITY:  PHYSICIAN:  Rolan Bucco, MD    DATE OF BIRTH:  [birth year]  DATE OF PROCEDURE:  08/14/2011 DATE OF DISCHARGE:                              OPERATIVE REPORT   SURGEON:  Rolan Bucco, MD  ASSISTANT:  Dr. Gwenyth Ober of General Surgery, did not perform any procedure.  He did come and evaluate the wound visually.  ESTIMATED BLOOD LOSS:  Minimal.  DRAINS:  Foley catheter remain in place.  FINDINGS:  Minimal necrotic tissue.  Findings are mostly viable tissue.  PROCEDURES PERFORMED:  Perineal wound debridement, creation of bilateral testicular thigh pouches.  Wound washout.  COMPLICATIONS:  None.  HISTORY OF PRESENT ILLNESS:  This is a 40 year old gentleman who presented with a Fournier gangrene of the perineum.  This was found to be tracking posteriorly to the anal triangle and anteriorly to the scrotum.  He was taken to the operating room 48 hours ago for wound debridement.  Today, he was taken back to the operating room for further debridement, wound examination, and creation of bilateral thigh pouches for his testicles.  Informed consent was obtained from his next of kin which was his mother as the patient was intubated and sedated for the past 48 hours.  PROCEDURE:  Informed consent was obtained.  The patient was taken to the operating room, was placed in supine position, IV antibiotics, which the patient has been receiving throughout his hospital course, were continued.  The patient was already intubated and sedated and further sedation was carried out by the anesthesia team.  He was then placed in a dorsal lithotomy position where his pertinent neurovascular pressure points were padded appropriately.  SCDs were in place and turned on. Time-out was performed in which the correct patient, surgical site, and procedure were  identified and agreed upon by the team.  Next, his genitals, lower abdomen, thighs, perineum, and perianal area were all prepped and draped in the usual sterile fashion.  Following this, inspection of the wound was performed.  There was very minimal necrotic tissue at the edges of the wound.  When I cut into these, I did remove a small amount of this area, and when I cut further, there was a good brisk bleeding. The wound was then inspected by Dr. Hulen Skains in the posterior area who felt there was no further resection or debridement needed posteriorly.  Next, Pulsavac wound irrigator was used to irrigate with over 2 L of sterile normal saline.  After this was done, thigh pouches were created bilaterally by bluntly dissecting superficial to the fascia lata of the thigh making sure to avoid injury to the saphenous vasculature.  Once the pouch was created, the suture holding the testicles together was removed and then the testicles were dipped in antibiotic solution, and then the right testicle and left testicle were checked with a Doppler ultrasound and found to have good signal.  The right testicle was then placed in the thigh pouch in a position in which the spermatic  cord was not twisted.  Once inside the pouch, Doppler ultrasound was checked and there remained a good signal.  The fascia was then reapproximated loosely with interrupted 2-0 Vicryl suture.  The same thing was done on the left side by creating a thigh pouch superficial to the fascia lata and avoiding the saphenous vasculature.  Once this was made, the left testicle was placed into this pouch making sure not to twist the spermatic cord.  After this was done, signal was checked with Doppler ultrasound and found to have a good strong signal.  The fascia was then reapproximated loosely with interrupted 2-0 Vicryl suture.  Next, the remaining double antibiotic irrigation was used with the Pulsavac to Irrigate the rest of the wound.   After this was done, a gauze Kerlix soaked in double antibiotic irrigation were packed into the wound and then dry ABD pad was placed over this.  Prior to leaving the operating room, I did check the Doppler signals on both testicles and they both had a strong signal.  This completed the procedure.  Patient was placed back in supine position and transported back to the ICU in a stable fashion.          ______________________________ Rolan Bucco, MD     DW/MEDQ  D:  08/14/2011  T:  08/15/2011  Job:  [id number]

## 2011-08-15 NOTE — Progress Notes (Signed)
Urology Progress Note  Subjective:     No acute urologic events. Patient remains intubated.  Objective:  Patient Vitals for the past 24 hrs:  BP Temp Temp src Pulse Resp SpO2  08/15/11 0400 - 99.8 F (37.7 C) Oral - - -  08/15/11 0347 109/71 mmHg - - 98  18  98 %  08/15/11 0300 109/70 mmHg - - 91  18  98 %  08/15/11 0200 113/70 mmHg - - 95  18  98 %  08/15/11 0100 118/73 mmHg - - 100  18  97 %  08/15/11 0005 107/66 mmHg - - 98  18  97 %  08/15/11 0000 107/66 mmHg 99.1 F (37.3 C) Oral 98  18  97 %  08/14/11 2300 110/64 mmHg - - 100  17  95 %  08/14/11 2200 107/68 mmHg - - 95  18  98 %  08/14/11 2100 109/67 mmHg - - 96  18  98 %  08/14/11 2000 114/70 mmHg 101.3 F (38.5 C) Oral 99  18  98 %  08/14/11 1927 124/74 mmHg - - 100  18  99 %  08/14/11 1900 124/71 mmHg - - 97  17  97 %  08/14/11 1830 126/76 mmHg - - 92  18  99 %  08/14/11 1815 118/73 mmHg - - 93  18  98 %  08/14/11 1800 118/80 mmHg - - 95  18  99 %  08/14/11 1745 114/73 mmHg - - 94  18  98 %  08/14/11 1730 111/74 mmHg - - 94  18  98 %  08/14/11 1715 114/70 mmHg - - 96  18  98 %  08/14/11 1700 - - - 99  21  96 %  08/14/11 1645 132/91 mmHg - - 99  19  97 %  08/14/11 1630 138/86 mmHg - - 101  18  96 %  08/14/11 1615 128/82 mmHg - - 101  18  96 %  08/14/11 1600 112/68 mmHg 99.1 F (37.3 C) Oral 101  18  95 %  08/14/11 1545 170/109 mmHg - - - - -  08/14/11 1538 132/91 mmHg - - 101  18  95 %  08/14/11 1530 132/91 mmHg - - - - -  08/14/11 1400 115/83 mmHg - - 95  15  100 %  08/14/11 1300 103/68 mmHg - - 93  18  98 %  08/14/11 1222 - 99.2 F (37.3 C) Axillary - - -  08/14/11 1217 108/68 mmHg - - 93  18  99 %  08/14/11 1200 107/76 mmHg - - 94  18  99 %  08/14/11 1100 108/69 mmHg - - 89  18  98 %  08/14/11 1000 105/69 mmHg - - 90  18  98 %  08/14/11 0900 100/56 mmHg - - 96  14  96 %  08/14/11 0832 - 97.7 F (36.5 C) Oral - - -  08/14/11 0800 101/69 mmHg - - 90  18  98 %  08/14/11 0748 101/68 mmHg - - 94  18  96 %    08/14/11 0700 104/66 mmHg - - 99  18  96 %    Physical Exam: General:  No acute distress, awake Abdomen:               [  ] Soft, appropriately TTP  [ x ] Soft, NTTP, no suprapubic crepitus  [  ] Soft, appropriately TTP, incision(s) clean/dry/intact  Genitourinary:  Testicles remain in thigh pouches  bilaterally. Wound edges and base clean without sign of further necrosis. Foley:  Draining concentrated urine.    I/O last 3 completed shifts: In: 7564.5 [I.V.:5102; IV Piggyback:2462.5] Out: 1575 [Urine:1575]     Assessment: Fournier's gangrene. Plan: -Dressing changed this morning. -Will place order for BID moist to dry wound dressing changes. -OK to extubate from GU point of view; no further planned urologic surgery indicated. -Recommend consulting both the Wound Care team and Dr. Theodoro Kos (plastic surgeon) for further management of wound. Could also consider transfer to [facility]; would directly contact the director, Dr. Deirdre Evener if this route is taken. -Will continue to evaluate wound.   Rolan Bucco, MD [phone]

## 2011-08-15 NOTE — Consult Note (Signed)
WOC consult Note Reason for Consult: wound care consult, pt s/p debridement for fournier's gangrene of perineal/testicular area.  Consulted wound care team along with plastics.  I spoke directly with Dr. Lamonte Sakai in the unit today and he has already spoken with Dr. Migdalia Dk for consult tom.  At that time she may decide on further intervention for plastics intervention or change in current wound care.  With location of wound and need for frequent assessment would recommend continued use of moist gauze dressing to allow for BID assessment for changes in the status of the wound.  Once pt several weeks out from debridement may be candidate for other types of wound care at that time.  Do not feel that VAC would be appropriate due to location and difficultly with maintenance of seal in this area.  I have notified Dr. Jasmine December -urology as well that consult will need to be made directly with Dr. Migdalia Dk prior to speaking with Dr. Lamonte Sakai.  Will let him know when he returns my call that this has been taken care of.    Re consult if further topical wound care recommendations needed after wound begins to fill.  [place], [place]

## 2011-08-15 NOTE — Progress Notes (Signed)
Name: Edward [name] MRN: [id number] DOB: 1971/04/23    LOS: 3  PCCM ADMISSION NOTE  History of Present Illness: 40 year old male with history of diabetes. Reported to the ER today with increased pain in his perineum and fever. Fever started early this morning. No nausea or emesis. Work-up reveals leukocytosis and crepitus in the perineum.  Urology saw patient and decided to take to the OR for debridement    Lines / Drains: PIV ET tube 12/24>>>  Cultures: Blood 12/24>>> Sputum 12/24>>> Urine 12/24>>> Wound 12/24>>> GNR and GPC >>  Antibiotics: Clinda 12/24>>> Zosyn 12/24>>> Vancomycin 12/24>>>  Tests / Events: 12/24 to OR for debridement. 12/26 further debridement, Dr Jasmine December  Subjective: Tolerating PSV No plans for further debridement per Dr Delton Coombes notes   Vital Signs: Filed Vitals:   08/15/11 0847  BP:   Pulse:   Temp: 98.9 F (37.2 C)  Resp:     Intake/Output Summary (Last 24 hours) at 08/15/11 [id number] Last data filed at 08/15/11 0800  Gross per 24 hour  Intake   4779 ml  Output   1130 ml  Net   3649 ml    Physical Examination: General: No distress Neuro:  Awake and following commands.   HEENT:  [place]/AT, PERRL, EOM-I and moist mucous membranes. Neck:  Supple, -LAN and -thyromegally.   Cardiovascular:  RRR, Nl S1/S2, -M/R/G. Lungs:  CTA bilaterally. Abdomen:  Soft, tender suprapubically, ND and +BS. Musculoskeletal:  -edema and -tenderness. Skin:  WNL  Labs and Imaging:   CBC    Component Value Date/Time   WBC 10.1 08/15/2011 0355   RBC 3.82* 08/15/2011 0355   HGB 10.7* 08/15/2011 0355   HCT 32.4* 08/15/2011 0355   PLT 193 08/15/2011 0355   MCV 84.8 08/15/2011 0355   MCH 28.0 08/15/2011 0355   MCHC 33.0 08/15/2011 0355   RDW 12.9 08/15/2011 0355   LYMPHSABS 0.6* 08/12/2011 1044   MONOABS 1.8* 08/12/2011 1044   EOSABS 0.0 08/12/2011 1044   BASOSABS 0.0 08/12/2011 1044   BMET    Component Value Date/Time   NA 139 08/15/2011 0355   K  3.3* 08/15/2011 0355   CL 110 08/15/2011 0355   CO2 18* 08/15/2011 0355   GLUCOSE 156* 08/15/2011 0355   BUN 15 08/15/2011 0355   CREATININE 1.16 08/15/2011 0355   CALCIUM 7.6* 08/15/2011 0355   GFRNONAA 77* 08/15/2011 0355   GFRAA 89* 08/15/2011 0355    Lab 08/14/11 0520  MG 2.0   Lab Results  Component Value Date   CALCIUM 7.6* 08/15/2011   PHOS 2.9 08/14/2011    Assessment and Plan: 50 M with fournier's gangrene, urology evaluated the patient and called PCCM to admit as they will keep the patient sedated and intubated post procedure since for serial OR visits.  Patient went to OR, testicles placed in thigh and will need reconstructive surgery once more stable.    VDRF:  - extubate 12/27 - pulm hygiene  fournier's gangrene, MRSA negative, wound cx pending - vanco/zosyn/clinda - follow cx's - No further debridement planned at this time; Dr Jasmine December recommends consult wound care and Dr Theodoro Kos (plastics)  Acute renal failure, improving  Lab 08/15/11 0355 08/14/11 0520 08/13/11 1545 08/13/11 0450 08/12/11 1044  CREATININE 1.16 1.25 1.33 1.43* 1.08  - follow BMP - IVF's  FEN: - hopefully start PO after extubation  Baltazar Apo, MD, PhD 08/15/2011, 8:52 AM [place] [facility] [phone] or if no answer [phone]

## 2011-08-16 ENCOUNTER — Inpatient Hospital Stay (HOSPITAL_COMMUNITY): Payer: 59

## 2011-08-16 ENCOUNTER — Encounter (HOSPITAL_COMMUNITY): Payer: Self-pay | Admitting: Plastic Surgery

## 2011-08-16 DIAGNOSIS — J9600 Acute respiratory failure, unspecified whether with hypoxia or hypercapnia: Secondary | ICD-10-CM

## 2011-08-16 DIAGNOSIS — A419 Sepsis, unspecified organism: Secondary | ICD-10-CM

## 2011-08-16 DIAGNOSIS — M726 Necrotizing fasciitis: Secondary | ICD-10-CM

## 2011-08-16 DIAGNOSIS — N493 Fournier gangrene: Secondary | ICD-10-CM | POA: Diagnosis present

## 2011-08-16 LAB — BASIC METABOLIC PANEL
BUN: 12 mg/dL (ref 6–23)
Calcium: 7.6 mg/dL — ABNORMAL LOW (ref 8.4–10.5)
Creatinine, Ser: 1.13 mg/dL (ref 0.50–1.35)
GFR calc Af Amer: >90 mL/min (ref >90)
GFR calc non Af Amer: 79 mL/min — ABNORMAL LOW (ref >90)
Potassium: 3.2 mEq/L — ABNORMAL LOW (ref 3.5–5.1)

## 2011-08-16 LAB — CBC
MCHC: 34 g/dL (ref 30.0–36.0)
Platelets: 206 10*3/uL (ref 150–400)
RDW: 12.6 % (ref 11.5–15.5)

## 2011-08-16 LAB — VANCOMYCIN, TROUGH: Vancomycin Tr: 14.8 ug/mL (ref 10.0–20.0)

## 2011-08-16 MED ORDER — DEXTROSE 5 % IV SOLN
2.0000 g | INTRAVENOUS | Status: DC
  Administered 2011-08-17 – 2011-08-20 (×4): 2 g via INTRAVENOUS
  Filled 2011-08-16 (×4): qty 2

## 2011-08-16 MED ORDER — SODIUM CHLORIDE 0.9 % IV SOLN
INTRAVENOUS | Status: DC
  Administered 2011-08-19: 10 mL/h via INTRAVENOUS

## 2011-08-16 MED ORDER — METFORMIN HCL 850 MG PO TABS
850.0000 mg | ORAL_TABLET | Freq: Two times a day (BID) | ORAL | Status: DC
  Administered 2011-08-17 – 2011-08-20 (×7): 850 mg via ORAL
  Filled 2011-08-16 (×11): qty 1

## 2011-08-16 MED ORDER — POTASSIUM CHLORIDE 10 MEQ/100ML IV SOLN: 10.0000 meq | INTRAVENOUS | Status: DC

## 2011-08-16 MED ORDER — DEXTROSE 5 % IV SOLN
1.0000 g | Freq: Once | INTRAVENOUS | Status: AC
  Administered 2011-08-16: 1 g via INTRAVENOUS
  Filled 2011-08-16: qty 10

## 2011-08-16 MED ORDER — INSULIN ASPART 100 UNIT/ML ~~LOC~~ SOLN
0.0000 [IU] | Freq: Every day | SUBCUTANEOUS | Status: DC
  Administered 2011-08-17: 2 [IU] via SUBCUTANEOUS

## 2011-08-16 MED ORDER — COLLAGENASE 250 UNIT/GM EX OINT
1.0000 "application " | TOPICAL_OINTMENT | Freq: Three times a day (TID) | CUTANEOUS | Status: DC
  Administered 2011-08-16 – 2011-08-19 (×7): 1 via TOPICAL
  Filled 2011-08-16 (×2): qty 30

## 2011-08-16 MED ORDER — POTASSIUM CHLORIDE CRYS ER 20 MEQ PO TBCR
40.0000 meq | EXTENDED_RELEASE_TABLET | Freq: Once | ORAL | Status: AC
  Administered 2011-08-16: 40 meq via ORAL
  Filled 2011-08-16: qty 2

## 2011-08-16 MED ORDER — VANCOMYCIN HCL 1000 MG IV SOLR
1250.0000 mg | Freq: Three times a day (TID) | INTRAVENOUS | Status: DC
  Filled 2011-08-16 (×2): qty 1250

## 2011-08-16 MED ORDER — PANTOPRAZOLE SODIUM 40 MG PO TBEC
40.0000 mg | DELAYED_RELEASE_TABLET | Freq: Every day | ORAL | Status: DC
  Administered 2011-08-16 – 2011-08-20 (×5): 40 mg via ORAL
  Filled 2011-08-16 (×4): qty 1
  Filled 2011-08-16: qty 31

## 2011-08-16 MED ORDER — INSULIN ASPART 100 UNIT/ML ~~LOC~~ SOLN
0.0000 [IU] | Freq: Three times a day (TID) | SUBCUTANEOUS | Status: DC
  Administered 2011-08-16: 2 [IU] via SUBCUTANEOUS
  Administered 2011-08-17 (×3): 3 [IU] via SUBCUTANEOUS
  Administered 2011-08-18: 2 [IU] via SUBCUTANEOUS
  Administered 2011-08-18: 3 [IU] via SUBCUTANEOUS
  Administered 2011-08-18 – 2011-08-19 (×2): 2 [IU] via SUBCUTANEOUS
  Administered 2011-08-19 (×2): 3 [IU] via SUBCUTANEOUS
  Administered 2011-08-20: 2 [IU] via SUBCUTANEOUS

## 2011-08-16 MED ORDER — DEXTROSE 5 % IV SOLN
1.0000 g | INTRAVENOUS | Status: DC
  Administered 2011-08-16: 1 g via INTRAVENOUS
  Filled 2011-08-16: qty 10

## 2011-08-16 MED ORDER — LISINOPRIL 20 MG PO TABS
20.0000 mg | ORAL_TABLET | Freq: Every day | ORAL | Status: DC
  Administered 2011-08-16 – 2011-08-20 (×5): 20 mg via ORAL
  Filled 2011-08-16 (×5): qty 1

## 2011-08-16 NOTE — Progress Notes (Signed)
[facility] Progress Note Patient Name: Edward [name] DOB: [birth year] MRN: [id number]  Date of Service  08/16/2011   HPI/Events of Note  hypokalemia   eICU Interventions  Potassium replaced   Intervention Category Intermediate Interventions: Electrolyte abnormality - evaluation and management  [name] 08/16/2011, 5:59 AM

## 2011-08-16 NOTE — Progress Notes (Signed)
Dr. Migdalia Dk has consulted on this patient with the primary surgical service being Urology.  She is considering coverage options.  General surgery has no continuing role in his care at this point.  However, if wound contamination with stool becomes a problem, and diversion is considered, please re-consult [place] as needed.  We will sign off for now.  Kathryne Eriksson. Dahlia Bailiff, MD, [place] [phone] [facility] Surgery

## 2011-08-16 NOTE — Progress Notes (Addendum)
Name: Edward [name] MRN: [id number] DOB: Mar 30, 1971    LOS: 4  PCCM ADMISSION NOTE  History of Present Illness: 40 year old male with history of diabetes. Reported to the ER today with increased pain in his perineum and fever. Fever started early this morning. No nausea or emesis. Work-up reveals leukocytosis and crepitus in the perineum.  Urology saw patient and decided to take to the OR for debridement. Wound will now need expert care and closure in consultation w Dr Migdalia Dk  Lines / Drains: PIV ET tube 12/24>>>12/27  Cultures: Blood 12/24>>> Sputum 12/24>>>  Urine 12/24>>> negative Wound 12/24>>> GNR and GPC >> microaerophilic strep  Antibiotics: Clinda 12/24>>> Zosyn 12/24>>> 12/28 Vancomycin 12/24>>> 12/28 Ceftriaxone 12/28 >>   Tests / Events: 12/24 to OR for debridement. 12/26 further debridement, Dr Jasmine December  Subjective: Tolerated extubation Still w some pain Seen by Dr Migdalia Dk 12/28   Vital Signs: Filed Vitals:   08/16/11 0900  BP:   Pulse: 89  Temp:   Resp: 22    Intake/Output Summary (Last 24 hours) at 08/16/11 0902 Last data filed at 08/16/11 0900  Gross per 24 hour  Intake 5047.5 ml  Output   2165 ml  Net 2882.5 ml    Physical Examination: General: No distress Neuro:  Awake and following commands.   HEENT:  [place]/AT, PERRL, EOM-I and moist mucous membranes. Neck:  Supple, -LAN and -thyromegally.   Cardiovascular:  RRR, Nl S1/S2, -M/R/G. Lungs:  CTA bilaterally. Abdomen:  Soft, tender suprapubically, ND and +BS. Musculoskeletal:  -edema and -tenderness. Skin:  WNL  Labs and Imaging:   CBC    Component Value Date/Time   WBC 9.1 08/16/2011 0415   RBC 4.11* 08/16/2011 0415   HGB 11.7* 08/16/2011 0415   HCT 34.4* 08/16/2011 0415   PLT 206 08/16/2011 0415   MCV 83.7 08/16/2011 0415   MCH 28.5 08/16/2011 0415   MCHC 34.0 08/16/2011 0415   RDW 12.6 08/16/2011 0415   LYMPHSABS 0.6* 08/12/2011 1044   MONOABS 1.8* 08/12/2011 1044   EOSABS 0.0  08/12/2011 1044   BASOSABS 0.0 08/12/2011 1044   BMET    Component Value Date/Time   NA 138 08/16/2011 0415   K 3.2* 08/16/2011 0415   CL 109 08/16/2011 0415   CO2 18* 08/16/2011 0415   GLUCOSE 132* 08/16/2011 0415   BUN 12 08/16/2011 0415   CREATININE 1.13 08/16/2011 0415   CALCIUM 7.6* 08/16/2011 0415   GFRNONAA 79* 08/16/2011 0415   GFRAA >90 08/16/2011 0415    Lab 08/14/11 0520  MG 2.0   Lab Results  Component Value Date   CALCIUM 7.6* 08/16/2011   PHOS 2.9 08/14/2011    Assessment and Plan: 42 M with fournier's gangrene, urology evaluated the patient and called PCCM to admit as they will keep the patient sedated and intubated post procedure since for serial OR visits.  Patient went to OR, testicles placed in thigh and will need reconstructive surgery.    VDRF, B infiltrates R > L consistent with possible ALI, volume overload from sepsis resuscitation  - extubated 12/27 - pulm hygiene - consider diuresis   fournier's gangrene, MRSA negative, wound cx = microaerophilic strep - vanco/zosyn/clinda started empirically, will d/c vanco 12/28, change zosyn to ceftriaxone - No further debridement planned at this time - Dr Migdalia Dk has evaluated >> TID dressing changes w saline and santyl  Acute renal failure, improving  Lab 08/16/11 0415 08/15/11 0355 08/14/11 0520 08/13/11 1545 08/13/11 0450  CREATININE 1.13 1.16  1.25 1.33 1.43*  - follow BMP - KVO IVF's  FEN: - diet started  Baltazar Apo, MD, PhD 08/16/2011, 9:02 AM [place] [facility] [phone] or if no answer [phone]

## 2011-08-16 NOTE — Progress Notes (Signed)
ANTIBIOTIC CONSULT NOTE - FOLLOW UP  Pharmacy Consult for Vancomycin Indication: Fournier's gangrene s/p debridement  No Known Allergies  Patient Measurements: Height: 6\' 1"  (185.4 cm) Weight: 263 lb 14.3 oz (119.7 kg) IBW/kg (Calculated) : 79.9   Vital Signs: Temp: 98.7 F (37.1 C) (12/28 0400) Temp src: Oral (12/28 0400) BP: 139/84 mmHg (12/28 0400) Pulse Rate: 88  (12/28 0400) Intake/Output from previous day: 12/27 0701 - 12/28 0700 In: 4440 [P.O.:360; I.V.:2867.5; IV Piggyback:1212.5] Out: 1700 [Urine:1700] Intake/Output from this shift: Total I/O In: 1460 [I.V.:1360; IV Piggyback:100] Out: 1025 [Urine:1025]  Labs:  [facility] 08/16/11 0415 08/15/11 0355 08/14/11 0520  WBC 9.1 10.1 11.6*  HGB 11.7* 10.7* 10.6*  PLT 206 193 178  LABCREA -- -- --  CREATININE 1.13 1.16 1.25   Estimated Creatinine Clearance: 116.6 ml/min (by C-G formula based on Cr of 1.13).  Basename 08/16/11 0415 08/14/11 1342  VANCOTROUGH 14.8 9.9*  VANCOPEAK -- --  Jake Michaelis -- --  GENTTROUGH -- --  GENTPEAK -- --  GENTRANDOM -- --  TOBRATROUGH -- --  TOBRAPEAK -- --  TOBRARND -- --  AMIKACINPEAK -- --  AMIKACINTROU -- --  AMIKACIN -- --     Microbiology: Recent Results (from the past 720 hour(s))  CULTURE, BLOOD (ROUTINE X 2)     Status: Normal (Preliminary result)   Collection Time   08/12/11 10:50 AM      Component Value Range Status Comment   Specimen Description BLOOD LEFT ARM   Final    Special Requests BOTTLES DRAWN AEROBIC AND ANAEROBIC 4CC   Final    Setup Time [id number]   Final    Culture     Final    Value:        BLOOD CULTURE RECEIVED NO GROWTH TO DATE CULTURE WILL BE HELD FOR 5 DAYS BEFORE ISSUING A FINAL NEGATIVE REPORT   Report Status PENDING   Incomplete   CULTURE, BLOOD (ROUTINE X 2)     Status: Normal (Preliminary result)   Collection Time   08/12/11 10:54 AM      Component Value Range Status Comment   Specimen Description BLOOD RIGHT ARM   Final    Special Requests BOTTLES DRAWN AEROBIC AND ANAEROBIC 10CC   Final    Setup Time [id number]   Final    Culture     Final    Value:        BLOOD CULTURE RECEIVED NO GROWTH TO DATE CULTURE WILL BE HELD FOR 5 DAYS BEFORE ISSUING A FINAL NEGATIVE REPORT   Report Status PENDING   Incomplete   CULTURE, ROUTINE-ABSCESS     Status: Normal   Collection Time   08/12/11  5:11 PM      Component Value Range Status Comment   Specimen Description ABSCESS PERINEAL   Final    Special Requests PT ON VANC   Final    Gram Stain     Final    Value: NO WBC SEEN     NO SQUAMOUS EPITHELIAL CELLS SEEN     MODERATE GRAM NEGATIVE RODS     FEW GRAM POSITIVE COCCI     IN PAIRS   Culture     Final    Value: ABUNDANT MICROAEROPHILIC STREPTOCOCCI     Note: Standardized susceptibility testing for this organism is not available.   Report Status 08/15/2011 FINAL   Final   ANAEROBIC CULTURE     Status: Normal (Preliminary result)   Collection Time   08/12/11  5:11  PM      Component Value Range Status Comment   Specimen Description ABSCESS PERINEAL   Final    Special Requests PT ON VANC   Final    Gram Stain     Final    Value: NO WBC SEEN     NO SQUAMOUS EPITHELIAL CELLS SEEN     MODERATE GRAM NEGATIVE RODS     FEW GRAM POSITIVE COCCI     IN PAIRS   Culture     Final    Value: NO ANAEROBES ISOLATED; CULTURE IN PROGRESS FOR 5 DAYS   Report Status PENDING   Incomplete   MRSA PCR SCREENING     Status: Normal   Collection Time   08/12/11  5:15 PM      Component Value Range Status Comment   MRSA by PCR NEGATIVE  NEGATIVE  Final   CULTURE, RESPIRATORY     Status: Normal   Collection Time   08/12/11  6:00 PM      Component Value Range Status Comment   Specimen Description ENDOTRACHEAL   Final    Special Requests NONE   Final    Gram Stain     Final    Value: RARE WBC PRESENT,BOTH PMN AND MONONUCLEAR     NO SQUAMOUS EPITHELIAL CELLS SEEN     NO ORGANISMS SEEN   Culture Non-Pathogenic Oropharyngeal-type Flora  Isolated.   Final    Report Status 08/15/2011 FINAL   Final   URINE CULTURE     Status: Normal   Collection Time   08/12/11  6:42 PM      Component Value Range Status Comment   Specimen Description URINE, CLEAN CATCH   Final    Special Requests NONE   Final    Setup Time [id number]   Final    Colony Count NO GROWTH   Final    Culture NO GROWTH   Final    Report Status 08/14/2011 FINAL   Final       Assessment: 40 yo male with gangrene s/p debridement for vancomycin. Wound cultures growing microaerophilic strep.  Goal of Therapy:  Vancomycin trough 15-20   Plan:  Will change vancomycin 1250 mg IV q8h, f/u for any regimen changes given new culture data.  Caryl Pina 08/16/2011,6:07 AM

## 2011-08-16 NOTE — Consult Note (Signed)
Reason for Consult:Fournier Gangrene Referring Physician: Dr. Francis Dowse Edward [name] is an 40 y.o. male.  HPI: Edward [name] is a 40 yrs old bm in the ICU at present receiving IV antibiotics for fournier gangrene.  One week ago he noticed pain and tenderness in his scrotal area that started with a small bump.  In three days time he developed an extensive infection, fevers chills and extreme pain.  He came to the hospital and was taken to the OR for irrigation and debridement by [facility] and Urology.  His testicles were placed in thigh pouches and he is currently receiving twice a day wet to dry dressing changes.  He was extubated yesterday.  He is still in pain but it has improved and he has stabilized his vitals as well.  He is not aware of the cause but admits that he has not been controlling his blood sugars lately and does smoke.  Past Medical History  Diagnosis Date  . Diabetes mellitus   . Hypertension    Past Surgical History  Procedure Date  . Irrigation and debridement abscess 08/12/2011    Procedure: IRRIGATION AND DEBRIDEMENT ABSCESS;  Surgeon: Molli Hazard, MD;  Location: [facility];  Service: Urology;  Laterality: N/A;  irrigation and debridment perineum    . Incision and drainage of wound 08/12/2011    Procedure: IRRIGATION AND DEBRIDEMENT WOUND;  Surgeon: Zenovia Jarred, MD;  Location: [facility];  Service: General;;  perineum   History reviewed. No pertinent family history.  Social History:  reports that he has been smoking.  He does not have any smokeless tobacco history on file. He reports that he drinks alcohol. His drug history not on file.  Allergies: No Known Allergies  Medications: I have reviewed the patient's current medications.  Results for orders placed during the hospital encounter of 08/12/11 (from the past 48 hour(s))  GLUCOSE, CAPILLARY     Status: Abnormal   Collection Time   08/14/11  7:40 AM      Component Value Range Comment   Glucose-Capillary 199 (*) 70 -  99 (mg/dL)   GLUCOSE, CAPILLARY     Status: Abnormal   Collection Time   08/14/11 12:17 PM      Component Value Range Comment   Glucose-Capillary 167 (*) 70 - 99 (mg/dL)   VANCOMYCIN, TROUGH     Status: Abnormal   Collection Time   08/14/11  1:42 PM      Component Value Range Comment   Vancomycin Tr 9.9 (*) 10.0 - 20.0 (ug/mL)   GLUCOSE, CAPILLARY     Status: Abnormal   Collection Time   08/14/11  3:33 PM      Component Value Range Comment   Glucose-Capillary 151 (*) 70 - 99 (mg/dL)   GLUCOSE, CAPILLARY     Status: Abnormal   Collection Time   08/14/11  7:32 PM      Component Value Range Comment   Glucose-Capillary 164 (*) 70 - 99 (mg/dL)   GLUCOSE, CAPILLARY     Status: Abnormal   Collection Time   08/14/11 11:12 PM      Component Value Range Comment   Glucose-Capillary 167 (*) 70 - 99 (mg/dL)   GLUCOSE, CAPILLARY     Status: Abnormal   Collection Time   08/15/11  3:27 AM      Component Value Range Comment   Glucose-Capillary 172 (*) 70 - 99 (mg/dL)   BASIC METABOLIC PANEL     Status: Abnormal  Collection Time   08/15/11  3:55 AM      Component Value Range Comment   Sodium 139  135 - 145 (mEq/L)    Potassium 3.3 (*) 3.5 - 5.1 (mEq/L)    Chloride 110  96 - 112 (mEq/L)    CO2 18 (*) 19 - 32 (mEq/L)    Glucose, Bld 156 (*) 70 - 99 (mg/dL)    BUN 15  6 - 23 (mg/dL)    Creatinine, Ser 1.16  0.50 - 1.35 (mg/dL)    Calcium 7.6 (*) 8.4 - 10.5 (mg/dL)    GFR calc non Af Amer 77 (*) >90 (mL/min)    GFR calc Af Amer 89 (*) >90 (mL/min)   CBC     Status: Abnormal   Collection Time   08/15/11  3:55 AM      Component Value Range Comment   WBC 10.1  4.0 - 10.5 (K/uL)    RBC 3.82 (*) 4.22 - 5.81 (MIL/uL)    Hemoglobin 10.7 (*) 13.0 - 17.0 (g/dL)    HCT 32.4 (*) 39.0 - 52.0 (%)    MCV 84.8  78.0 - 100.0 (fL)    MCH 28.0  26.0 - 34.0 (pg)    MCHC 33.0  30.0 - 36.0 (g/dL)    RDW 12.9  11.5 - 15.5 (%)    Platelets 193  150 - 400 (K/uL)   PROCALCITONIN     Status: Normal    Collection Time   08/15/11  3:55 AM      Component Value Range Comment   Procalcitonin 3.64     GLUCOSE, CAPILLARY     Status: Abnormal   Collection Time   08/15/11  7:32 AM      Component Value Range Comment   Glucose-Capillary 144 (*) 70 - 99 (mg/dL)   GLUCOSE, CAPILLARY     Status: Abnormal   Collection Time   08/15/11 12:08 PM      Component Value Range Comment   Glucose-Capillary 135 (*) 70 - 99 (mg/dL)   GLUCOSE, CAPILLARY     Status: Abnormal   Collection Time   08/15/11  3:32 PM      Component Value Range Comment   Glucose-Capillary 171 (*) 70 - 99 (mg/dL)   GLUCOSE, CAPILLARY     Status: Abnormal   Collection Time   08/15/11  8:21 PM      Component Value Range Comment   Glucose-Capillary 168 (*) 70 - 99 (mg/dL)   GLUCOSE, CAPILLARY     Status: Abnormal   Collection Time   08/15/11 11:33 PM      Component Value Range Comment   Glucose-Capillary 149 (*) 70 - 99 (mg/dL)    Comment 1 Documented in Chart      Comment 2 Notify RN     GLUCOSE, CAPILLARY     Status: Abnormal   Collection Time   08/16/11  4:10 AM      Component Value Range Comment   Glucose-Capillary 127 (*) 70 - 99 (mg/dL)   BASIC METABOLIC PANEL     Status: Abnormal   Collection Time   08/16/11  4:15 AM      Component Value Range Comment   Sodium 138  135 - 145 (mEq/L)    Potassium 3.2 (*) 3.5 - 5.1 (mEq/L)    Chloride 109  96 - 112 (mEq/L)    CO2 18 (*) 19 - 32 (mEq/L)    Glucose, Bld 132 (*) 70 - 99 (mg/dL)  BUN 12  6 - 23 (mg/dL)    Creatinine, Ser 1.13  0.50 - 1.35 (mg/dL)    Calcium 7.6 (*) 8.4 - 10.5 (mg/dL)    GFR calc non Af Amer 79 (*) >90 (mL/min)    GFR calc Af Amer >90  >90 (mL/min)   CBC     Status: Abnormal   Collection Time   08/16/11  4:15 AM      Component Value Range Comment   WBC 9.1  4.0 - 10.5 (K/uL)    RBC 4.11 (*) 4.22 - 5.81 (MIL/uL)    Hemoglobin 11.7 (*) 13.0 - 17.0 (g/dL)    HCT 34.4 (*) 39.0 - 52.0 (%)    MCV 83.7  78.0 - 100.0 (fL)    MCH 28.5  26.0 - 34.0  (pg)    MCHC 34.0  30.0 - 36.0 (g/dL)    RDW 12.6  11.5 - 15.5 (%)    Platelets 206  150 - 400 (K/uL)   VANCOMYCIN, TROUGH     Status: Normal   Collection Time   08/16/11  4:15 AM      Component Value Range Comment   Vancomycin Tr 14.8  10.0 - 20.0 (ug/mL)     Dg Chest Port 1 View  08/15/2011  *RADIOLOGY REPORT*  Clinical Data: Evaluate endotracheal tube and lines.  PORTABLE CHEST - 1 VIEW  Comparison: 08/14/2011.  Findings: Endotracheal tube is in satisfactory position. Nasogastric tube is followed into the stomach.  Heart size stable. There is bilateral perihilar and left basilar airspace disease.  No definite pleural fluid.  IMPRESSION: Bilateral perihilar and left basilar airspace disease.  Original Report Authenticated By: Luretha Rued, M.D.   Review of Systems  Constitutional: Negative.   HENT: Negative.   Eyes: Negative.   Respiratory: Negative.   Cardiovascular: Negative.   Genitourinary: Negative.   Musculoskeletal: Negative.   Skin:       Penis ulceration noted in PE  Neurological: Negative.   Endo/Heme/Allergies: Negative.   Psychiatric/Behavioral: Negative.    Blood pressure 133/76, pulse 89, temperature 98.7 F (37.1 C), temperature source Oral, resp. rate 20, height 6\' 1"  (1.854 m), weight 119.7 kg (263 lb 14.3 oz), SpO2 93.00%. Physical Exam  Constitutional: He appears well-developed.  HENT:  Head: Normocephalic and atraumatic.  Right Ear: External ear normal.  Left Ear: External ear normal.  Eyes: Conjunctivae and EOM are normal. Pupils are equal, round, and reactive to light.  Cardiovascular: Normal rate.   Respiratory: Effort normal.       O2 via [place]  GI: Soft. He exhibits no mass. There is no tenderness. There is no rebound and no guarding.  Neurological: He is alert.  Skin: Skin is warm.       Ulceration is on the penis nearly the entire length.  There is fibrous tissue with mild exudate, no abscess noted. No redness noted. Moderate tenderness.    Psychiatric: He has a normal mood and affect. His behavior is normal. Judgment and thought content normal.   Assessment/Plan: Fournier gangrene with open ulceration of the penis.   Recommend continued IV antibiotics, three times a day dressing changes with normal saline and adding santyl.   At present, it looks like the proximal portion of the wound can be closed primarily with grafting of the distal portion.   Would leave the testicles where they are for now.  No smoking Check prealbumin Add: Vitamin C 500 mg twice a day, Zinc 220 mg daily, Multivitamin daily  SANGER,CLAIRE 08/16/2011, 7:29 AM

## 2011-08-16 NOTE — Progress Notes (Signed)
Urology Progress Note  Subjective:     No acute urologic events. Patient weaned off vent.  I explained to the patient today the course of surgical events over the past 72 hours.  Objective:  Patient Vitals for the past 24 hrs:  BP Temp Temp src Pulse Resp SpO2 Weight  08/16/11 0400 139/84 mmHg 98.7 F (37.1 C) Oral 88  9  92 % 119.7 kg (263 lb 14.3 oz)  08/16/11 0300 145/82 mmHg - - 89  30  92 % -  08/16/11 0200 133/83 mmHg - - 94  38  93 % -  08/16/11 0100 145/86 mmHg - - 91  12  93 % -  08/16/11 0000 140/84 mmHg - - 94  44  92 % -  08/15/11 2333 - 99 F (37.2 C) Oral - - - -  08/15/11 2300 145/89 mmHg - - 92  30  90 % -  08/15/11 2200 144/84 mmHg - - 100  13  88 % -  08/15/11 2100 140/79 mmHg - - 98  32  91 % -  08/15/11 2000 - 99.9 F (37.7 C) Oral 101  48  89 % -  08/15/11 1900 133/74 mmHg - - 96  26  92 % -  08/15/11 1800 139/89 mmHg - - 94  22  90 % -  08/15/11 1700 139/87 mmHg - - 96  21  91 % -  08/15/11 1600 127/81 mmHg - - 95  15  97 % -  08/15/11 1533 - 99 F (37.2 C) - 95  20  96 % -  08/15/11 1500 - - - 104  19  94 % -  08/15/11 1400 148/94 mmHg - - 97  18  96 % -  08/15/11 1300 148/93 mmHg - - 93  19  93 % -  08/15/11 1217 - 98.7 F (37.1 C) Oral - - - -  08/15/11 1200 131/84 mmHg - - 90  20  96 % -  08/15/11 1100 122/91 mmHg - - 97  20  95 % -  08/15/11 1000 121/78 mmHg - - 94  25  96 % -  08/15/11 0930 116/75 mmHg - - 95  22  96 % -  08/15/11 0900 125/87 mmHg - - 104  22  95 % -  08/15/11 0847 - 98.9 F (37.2 C) Oral - - - -  08/15/11 0830 118/78 mmHg - - 96  22  97 % -  08/15/11 0800 116/75 mmHg - - 95  23  97 % -  08/15/11 0732 105/66 mmHg - - 93  18  97 % -  08/15/11 0700 105/69 mmHg - - 91  18  96 % -    Physical Exam: General:  No acute distress, awake Abdomen:               [  ] Soft, appropriately TTP  [ x ] Soft, NTTP, No crepitus   [  ] Soft, appropriately TTP, incision(s) clean/dry/intact  Genitourinary:  No crepitus around penis,  suprapubic area, or lateral edges of wound; no necrotic tissue noted around penis or lateral areas of the wound, testicles palpable in thigh pouches bilaterally Foley:  Draining concentrated urine.    I/O last 3 completed shifts: In: [id number] [P.O.:360; I.V.:5277.5; IV Piggyback:2112.5] Out: 1800 [Urine:1800]     Assessment: Fournier's gangrene. Plan: -No surgical debridement indicated today. -Patient now needs expert wound care. -No urologic need for foley catheter at this time;  likely still being used to assess accurate urine output.  May discontinue when felt appropriate by critical care.  Would also check with wound care expert first as they may want a catheter in place to keep the wound free of urine.  If prolonged use is needed, would change every 4 weeks. -Will continue to evaluate wound for need for urologic debridement.  Rolan Bucco, MD [phone]

## 2011-08-16 NOTE — Progress Notes (Signed)
Physical Therapy Treatment Patient Details Name: Edward [name] MRN: [id number] DOB: 1971-06-22 Today's Date: 08/16/2011  PT Assessment/Plan  PT - Assessment/Plan Comments on Treatment Session: Pt with excellent progress from evaluation and highly encouraged to ambulate with nursing throughout weekend to increase mobility. Pt progressing well and hopeful that he won't need therapy outside acute.  PT Plan: Discharge plan remains appropriate PT Goals  Acute Rehab PT Goals PT Goal: Supine/Side to Sit - Progress: Progressing toward goal PT Goal: Sit to Supine/Side - Progress: Progressing toward goal Pt will go Sit to Stand: Independently PT Goal: Sit to Stand - Progress: Updated due to goal met Pt will go Stand to Sit: Independently PT Goal: Stand to Sit - Progress: Updated due to goals met Pt will Ambulate: >150 feet;with modified independence;with least restrictive assistive device (> 500 feet) PT Goal: Ambulate - Progress: Revised (modified due to lack of progress/goal met)  PT Treatment Precautions/Restrictions  Precautions Precaution Comments: none Mobility (including Balance) Bed Mobility Supine to Sit: 5: Supervision;HOB flat Supine to Sit Details (indicate cue type and reason): cueing for safety with lines Sitting - Scoot to Edge of Bed: 6: Modified independent (Device/Increase time) Transfers Sit to Stand: 5: Supervision Sit to Stand Details (indicate cue type and reason): cueing for safety Stand to Sit: 5: Supervision Stand to Sit Details: cueing for hand placement and position Ambulation/Gait Ambulation/Gait Assistance: 5: Supervision Ambulation/Gait Assistance Details (indicate cue type and reason): Pt with cueing to step into RW and one LOB after 126ft with min assist to recover Ambulation Distance (Feet): 300 Feet Assistive device: Rolling walker Gait Pattern: Within Functional Limits;Decreased stride length Stairs: No  Posture/Postural Control Posture/Postural  Control: No significant limitations Exercise    End of Session PT - End of Session Equipment Utilized During Treatment: Gait belt Activity Tolerance: Patient tolerated treatment well Patient left: with call bell in reach;in chair Nurse Communication: Mobility status for ambulation;Mobility status for transfers General Behavior During Session: [facility] for tasks performed Cognition: [facility] for tasks performed  Melford Aase 08/16/2011, 2:57 PM Lanetta Inch, [place]

## 2011-08-16 NOTE — Progress Notes (Signed)
GU  Clarification:  Ulcer on penis noted by Dr. Migdalia Dk is actually an area where I debrided some penile skin in the OR to ensure it was viable.  Patient has been transferred to the floor.  Will continue to follow as a consulting physician.

## 2011-08-17 ENCOUNTER — Inpatient Hospital Stay (HOSPITAL_COMMUNITY): Payer: 59

## 2011-08-17 LAB — GLUCOSE, CAPILLARY
Glucose-Capillary: 158 mg/dL — ABNORMAL HIGH (ref 70–99)
Glucose-Capillary: 171 mg/dL — ABNORMAL HIGH (ref 70–99)

## 2011-08-17 LAB — BASIC METABOLIC PANEL
CO2: 17 mEq/L — ABNORMAL LOW (ref 19–32)
Calcium: 8.2 mg/dL — ABNORMAL LOW (ref 8.4–10.5)
Glucose, Bld: 167 mg/dL — ABNORMAL HIGH (ref 70–99)
Sodium: 137 mEq/L (ref 135–145)

## 2011-08-17 LAB — CBC
MCH: 28.4 pg (ref 26.0–34.0)
MCV: 82.5 fL (ref 78.0–100.0)
Platelets: 236 10*3/uL (ref 150–400)
RBC: 4.16 MIL/uL — ABNORMAL LOW (ref 4.22–5.81)

## 2011-08-17 NOTE — Progress Notes (Signed)
T: 98.6      BP: 144/84      P: 85          R:20  No scrotal pain.   Abdomen: soft non tender. Penile and scrotal wound clean with mild exudate.  Wound granulating well. No evidence of abscess.  Continue IV antibiotics. Continue wet to dry dressing.

## 2011-08-17 NOTE — Progress Notes (Signed)
[facility] progress note    Interval h/o:- 40 year old male with history of diabetes. Reported to the ER today with increased pain in his perineum and fever. Fever started early this morning. No nausea or emesis. Work-up reveals leukocytosis and crepitus in the perineum. Urology saw patient and decided to take to the OR for debridement 12.26 Lines / Drains:  PIV  ET tube 12/24>>>12/27  Cultures:  Blood 12/24>>>  Sputum 12/24>>>  Urine 12/24>>> negative  Wound 12/24>>> GNR and GPC >> microaerophilic strep  Antibiotics:  Clinda 12/24>>>  Zosyn 12/24>>> 12/28  Vancomycin 12/24>>> 12/28  Ceftriaxone 12/28 >>  Tests / Events:  12/24 to OR for debridement.  12/26 further debridement, Dr Jasmine December  Subjective: Well-sitting in bed.  Nursing reports 2-3 episodes of diarrhea and also some "oozing" from penile tip.  No n/v or signifant pain Treatment Team:  Molli Hazard, MD Objective: Vital signs in last 24 hours: Temp:  [98.1 F (36.7 C)-98.9 F (37.2 C)] 98.1 F (36.7 C) (12/29 1330) Pulse Rate:  [78-89] 78  (12/29 1330) Resp:  [20-22] 20  (12/29 1330) BP: (144-154)/(72-90) 154/90 mmHg (12/29 1330) SpO2:  [93 %-98 %] 98 % (12/29 1330) Weight:  [121.6 kg (268 lb 1.3 oz)] 268 lb 1.3 oz (121.6 kg) (12/29 [id number]) Weight change: 1.9 kg (4 lb 3 oz)  Intake/Output Summary (Last 24 hours) at 08/17/11 1838 Last data filed at 08/17/11 1400  Gross per 24 hour  Intake    720 ml  Output   2751 ml  Net  -2031 ml    BP 154/90  Pulse 78  Temp(Src) 98.1 F (36.7 C) (Oral)  Resp 20  Ht 6\' 1"  (1.854 m)  Wt 121.6 kg (268 lb 1.3 oz)  BMI 35.37 kg/m2  SpO2 98% General appearance: alert and cooperative Lungs: clear to auscultation bilaterally Abdomen: soft, non-tender; bowel sounds normal; no masses,  no organomegaly Male genitalia: deferred, patient had dressings on currently  Lab Results:  Basename 08/17/11 0600 08/16/11 0415  NA 137 138  K 3.6 3.2*  CL 106 109    CO2 17* 18*  GLUCOSE 167* 132*  BUN 11 12  CREATININE 1.04 1.13  CALCIUM 8.2* 7.6*  MG -- --  PHOS -- --     Basename 08/17/11 0600 08/16/11 0415  WBC 8.5 9.1  NEUTROABS -- --  HGB 11.8* 11.7*  HCT 34.3* 34.4*  MCV 82.5 83.7  PLT 236 206    Micro Results: Recent Results (from the past 240 hour(s))  CULTURE, BLOOD (ROUTINE X 2)     Status: Normal (Preliminary result)   Collection Time   08/12/11 10:50 AM      Component Value Range Status Comment   Specimen Description BLOOD LEFT ARM   Final    Special Requests BOTTLES DRAWN AEROBIC AND ANAEROBIC 4CC   Final    Setup Time [id number]   Final    Culture     Final    Value:        BLOOD CULTURE RECEIVED NO GROWTH TO DATE CULTURE WILL BE HELD FOR 5 DAYS BEFORE ISSUING A FINAL NEGATIVE REPORT   Report Status PENDING   Incomplete   CULTURE, BLOOD (ROUTINE X 2)     Status: Normal (Preliminary result)   Collection Time   08/12/11 10:54 AM      Component Value Range Status Comment   Specimen Description BLOOD RIGHT ARM   Final    Special Requests BOTTLES DRAWN AEROBIC AND  ANAEROBIC 10CC   Final    Setup Time [id number]   Final    Culture     Final    Value:        BLOOD CULTURE RECEIVED NO GROWTH TO DATE CULTURE WILL BE HELD FOR 5 DAYS BEFORE ISSUING A FINAL NEGATIVE REPORT   Report Status PENDING   Incomplete   CULTURE, ROUTINE-ABSCESS     Status: Normal   Collection Time   08/12/11  5:11 PM      Component Value Range Status Comment   Specimen Description ABSCESS PERINEAL   Final    Special Requests PT ON VANC   Final    Gram Stain     Final    Value: NO WBC SEEN     NO SQUAMOUS EPITHELIAL CELLS SEEN     MODERATE GRAM NEGATIVE RODS     FEW GRAM POSITIVE COCCI     IN PAIRS   Culture     Final    Value: ABUNDANT MICROAEROPHILIC STREPTOCOCCI     Note: Standardized susceptibility testing for this organism is not available.   Report Status 08/15/2011 FINAL   Final   ANAEROBIC CULTURE     Status: Normal (Preliminary  result)   Collection Time   08/12/11  5:11 PM      Component Value Range Status Comment   Specimen Description ABSCESS PERINEAL   Final    Special Requests PT ON VANC   Final    Gram Stain     Final    Value: NO WBC SEEN     NO SQUAMOUS EPITHELIAL CELLS SEEN     MODERATE GRAM NEGATIVE RODS     FEW GRAM POSITIVE COCCI     IN PAIRS   Culture     Final    Value: NO ANAEROBES ISOLATED; CULTURE IN PROGRESS FOR 5 DAYS   Report Status PENDING   Incomplete   MRSA PCR SCREENING     Status: Normal   Collection Time   08/12/11  5:15 PM      Component Value Range Status Comment   MRSA by PCR NEGATIVE  NEGATIVE  Final   CULTURE, RESPIRATORY     Status: Normal   Collection Time   08/12/11  6:00 PM      Component Value Range Status Comment   Specimen Description ENDOTRACHEAL   Final    Special Requests NONE   Final    Gram Stain     Final    Value: RARE WBC PRESENT,BOTH PMN AND MONONUCLEAR     NO SQUAMOUS EPITHELIAL CELLS SEEN     NO ORGANISMS SEEN   Culture Non-Pathogenic Oropharyngeal-type Flora Isolated.   Final    Report Status 08/15/2011 FINAL   Final   URINE CULTURE     Status: Normal   Collection Time   08/12/11  6:42 PM      Component Value Range Status Comment   Specimen Description URINE, CLEAN CATCH   Final    Special Requests NONE   Final    Setup Time [id number]   Final    Colony Count NO GROWTH   Final    Culture NO GROWTH   Final    Report Status 08/14/2011 FINAL   Final      Medications: I have reviewed the patient's current medications. Scheduled Meds:   . cefTRIAXone (ROCEPHIN)  IV  2 g Intravenous Q24H  . clindamycin (CLEOCIN) IV  900 mg Intravenous Q8H  . collagenase  1 application Topical TID  . insulin aspart  0-15 Units Subcutaneous TID WC  . insulin aspart  0-5 Units Subcutaneous QHS  . lisinopril  20 mg Oral Daily  . metFORMIN  850 mg Oral BID WC  . pantoprazole  40 mg Oral Q1200   Continuous Infusions:   . sodium chloride     PRN  Meds:.acetaminophen (TYLENOL) oral liquid 160 mg/5 mL, fentaNYL, insulin aspart   Assessment/Plan: Patient Active Hospital Problem List: Fournier gangrene (08/16/2011)   Assessment: Rx PEr urology and per Plastic surgery As is having diarrhoea, am concerned re: risk of Cdiff-Urologist, please discontinue Abx when u feel appropriate Will add florastor Acute respiratory failure (08/16/2011)   Assessment: resolved-extubated 12.27 Septic shock (08/16/2011)   Assessment: resolved Diabetes mellitus (08/16/2011)   Assessment: Very stable-blood sugars 100 range    I am not actively treating anything specific other then well controlled diabetes on this patient, and can follow. His management is primarily surgical with ultimate need for multiple surgical sub-specialist involved-please consider assuming his care   LOS: 5 days   [facility] 08/17/2011, 6:38 PM

## 2011-08-18 LAB — GLUCOSE, CAPILLARY
Glucose-Capillary: 158 mg/dL — ABNORMAL HIGH (ref 70–99)
Glucose-Capillary: 131 mg/dL — ABNORMAL HIGH (ref 70–99)
Glucose-Capillary: 129 mg/dL — ABNORMAL HIGH (ref 70–99)
Glucose-Capillary: 167 mg/dL — ABNORMAL HIGH (ref 70–99)

## 2011-08-18 LAB — CULTURE, BLOOD (ROUTINE X 2)
Culture  Setup Time: 201212241533
Culture: NO GROWTH

## 2011-08-18 MED ORDER — PROMETHAZINE HCL 25 MG/ML IJ SOLN
12.5000 mg | Freq: Four times a day (QID) | INTRAMUSCULAR | Status: DC | PRN
  Administered 2011-08-18: 19:00:00 via INTRAVENOUS
  Filled 2011-08-18: qty 1

## 2011-08-18 NOTE — Progress Notes (Signed)
[facility] progress note    Interval h/o:- 40 year old male with history of diabetes. Reported to the ER today with increased pain in his perineum and fever. Fever started early this morning. No nausea or emesis. Work-up reveals leukocytosis and crepitus in the perineum. Urology saw patient and decided to take to the OR for debridement 12.26 Lines / Drains:  PIV  ET tube 12/24>>>12/27  Cultures:  Blood 12/24>>>  Sputum 12/24>>>  Urine 12/24>>> negative  Wound 12/24>>> GNR and GPC >> microaerophilic strep (?contaminant) Antibiotics:  Clinda 12/24>>>  Zosyn 12/24>>> 12/28  Vancomycin 12/24>>> 12/28  Ceftriaxone 12/28 >>  Tests / Events:  12/24 to OR for debridement.  12/26 further debridement, Dr Jasmine December  Subjective: Had some vomtiing which thinks was secondary to the perfume smell of the [place].  Had diarrhoea today, which has "calmed down" today.  Pain in his scrotum is fine.  Noted debridement done by Dr. Janice Norrie at bedside.  No SOB.  No blood in the stool.   Treatment Team:  Molli Hazard, MD Objective: Vital signs in last 24 hours: Temp:  [98.1 F (36.7 C)-98.4 F (36.9 C)] 98.3 F (36.8 C) (12/30 1345) Pulse Rate:  [76-85] 76  (12/30 1345) Resp:  [18-19] 18  (12/30 1345) BP: (150-161)/(80-98) 157/93 mmHg (12/30 1345) SpO2:  [95 %-97 %] 97 % (12/30 1345) Weight change:   Intake/Output Summary (Last 24 hours) at 08/18/11 1759 Last data filed at 08/18/11 1300  Gross per 24 hour  Intake    480 ml  Output   2400 ml  Net  -1920 ml    BP 157/93  Pulse 76  Temp(Src) 98.3 F (36.8 C) (Oral)  Resp 18  Ht 6\' 1"  (1.854 m)  Wt 121.6 kg (268 lb 1.3 oz)  BMI 35.37 kg/m2  SpO2 97% General appearance: alert and cooperative Lungs: clear to auscultation bilaterally Abdomen: soft, non-tender; bowel sounds normal; no masses,  no organomegaly Male genitalia: deferred, patient had dressings on currently  Lab Results:  Basename 08/17/11 0600 08/16/11  0415  NA 137 138  K 3.6 3.2*  CL 106 109  CO2 17* 18*  GLUCOSE 167* 132*  BUN 11 12  CREATININE 1.04 1.13  CALCIUM 8.2* 7.6*  MG -- --  PHOS -- --     Basename 08/17/11 0600 08/16/11 0415  WBC 8.5 9.1  NEUTROABS -- --  HGB 11.8* 11.7*  HCT 34.3* 34.4*  MCV 82.5 83.7  PLT 236 206    Micro Results: Recent Results (from the past 240 hour(s))  CULTURE, BLOOD (ROUTINE X 2)     Status: Normal   Collection Time   08/12/11 10:50 AM      Component Value Range Status Comment   Specimen Description BLOOD LEFT ARM   Final    Special Requests BOTTLES DRAWN AEROBIC AND ANAEROBIC 4CC   Final    Setup Time [id number]   Final    Culture NO GROWTH 5 DAYS   Final    Report Status 08/18/2011 FINAL   Final   CULTURE, BLOOD (ROUTINE X 2)     Status: Normal   Collection Time   08/12/11 10:54 AM      Component Value Range Status Comment   Specimen Description BLOOD RIGHT ARM   Final    Special Requests BOTTLES DRAWN AEROBIC AND ANAEROBIC 10CC   Final    Setup Time [id number]   Final    Culture NO GROWTH 5 DAYS   Final  Report Status 08/18/2011 FINAL   Final   CULTURE, ROUTINE-ABSCESS     Status: Normal   Collection Time   08/12/11  5:11 PM      Component Value Range Status Comment   Specimen Description ABSCESS PERINEAL   Final    Special Requests PT ON VANC   Final    Gram Stain     Final    Value: NO WBC SEEN     NO SQUAMOUS EPITHELIAL CELLS SEEN     MODERATE GRAM NEGATIVE RODS     FEW GRAM POSITIVE COCCI     IN PAIRS   Culture     Final    Value: ABUNDANT MICROAEROPHILIC STREPTOCOCCI     Note: Standardized susceptibility testing for this organism is not available.   Report Status 08/15/2011 FINAL   Final   ANAEROBIC CULTURE     Status: Normal (Preliminary result)   Collection Time   08/12/11  5:11 PM      Component Value Range Status Comment   Specimen Description ABSCESS PERINEAL   Final    Special Requests PT ON VANC   Final    Gram Stain     Final    Value: NO  WBC SEEN     NO SQUAMOUS EPITHELIAL CELLS SEEN     MODERATE GRAM NEGATIVE RODS     FEW GRAM POSITIVE COCCI     IN PAIRS   Culture     Final    Value: NO ANAEROBES ISOLATED; CULTURE IN PROGRESS FOR 5 DAYS   Report Status PENDING   Incomplete   MRSA PCR SCREENING     Status: Normal   Collection Time   08/12/11  5:15 PM      Component Value Range Status Comment   MRSA by PCR NEGATIVE  NEGATIVE  Final   CULTURE, RESPIRATORY     Status: Normal   Collection Time   08/12/11  6:00 PM      Component Value Range Status Comment   Specimen Description ENDOTRACHEAL   Final    Special Requests NONE   Final    Gram Stain     Final    Value: RARE WBC PRESENT,BOTH PMN AND MONONUCLEAR     NO SQUAMOUS EPITHELIAL CELLS SEEN     NO ORGANISMS SEEN   Culture Non-Pathogenic Oropharyngeal-type Flora Isolated.   Final    Report Status 08/15/2011 FINAL   Final   URINE CULTURE     Status: Normal   Collection Time   08/12/11  6:42 PM      Component Value Range Status Comment   Specimen Description URINE, CLEAN CATCH   Final    Special Requests NONE   Final    Setup Time [id number]   Final    Colony Count NO GROWTH   Final    Culture NO GROWTH   Final    Report Status 08/14/2011 FINAL   Final    Medications: I have reviewed the patient's current medications. Scheduled Meds:    . cefTRIAXone (ROCEPHIN)  IV  2 g Intravenous Q24H  . clindamycin (CLEOCIN) IV  900 mg Intravenous Q8H  . collagenase  1 application Topical TID  . insulin aspart  0-15 Units Subcutaneous TID WC  . insulin aspart  0-5 Units Subcutaneous QHS  . lisinopril  20 mg Oral Daily  . metFORMIN  850 mg Oral BID WC  . pantoprazole  40 mg Oral Q1200   Continuous Infusions:    .  sodium chloride     PRN Meds:.acetaminophen (TYLENOL) oral liquid 160 mg/5 mL, fentaNYL, insulin aspart, promethazine   Assessment/Plan: Patient Active Hospital Problem List: Fournier gangrene (08/16/2011)   Assessment: Rx PEr urology and per Plastic  surgery Diarrhea decreasing-appearance non-suggestive Cdiff and low risk Would attribute to Clindamycin-could also be metfromin-patient has not been compliant with this in the past-will change to am amaryl with breakfast if persists.  Hold PCR C diff Antibiotics to be weaned per dr. Janice Norrie and [employer] [place].  Appreciate assistance  Acute respiratory failure (08/16/2011)   Assessment: resolved-extubated 12.27  Septic shock (08/16/2011)   Assessment: resolved  Diabetes mellitus (08/16/2011)   Assessment: Very stable-blood sugars 100 range  Nausea-likely 2/2 to smells and abx-..hold further work-up   LOS: 6 days   Amdrew Oboyle,JAI 08/18/2011, 5:59 PM

## 2011-08-18 NOTE — Progress Notes (Signed)
T: 98.4      BP: 161/98    P: 77        R: 19  C/o nausea, vomiting, diarrhea.  Abdomen: soft, non distended, nontender.  Moderate amount of necrotic superficial tissue ventral aspect of the penis.  Foley draining clear urine.  Wound debridement done at bedside after IV Fentanyl.  Wound looks clean after debridement.  Plan: Continue wet to dry dressing.  Primary Care Team will address diarrhea, nausea.

## 2011-08-19 ENCOUNTER — Encounter (HOSPITAL_COMMUNITY): Payer: Self-pay | Admitting: Pharmacy Technician

## 2011-08-19 DIAGNOSIS — N493 Fournier gangrene: Secondary | ICD-10-CM

## 2011-08-19 LAB — GLUCOSE, CAPILLARY

## 2011-08-19 LAB — ANAEROBIC CULTURE

## 2011-08-19 MED ORDER — PRO-STAT SUGAR FREE PO LIQD
30.0000 mL | Freq: Three times a day (TID) | ORAL | Status: DC
  Administered 2011-08-19 – 2011-08-20 (×2): 30 mL via ORAL
  Filled 2011-08-19 (×5): qty 30

## 2011-08-19 NOTE — Progress Notes (Signed)
08/19/2011 Koty Anctil SPARKS Case Management Note [phone]   Utilization review completed.  

## 2011-08-19 NOTE — Progress Notes (Signed)
[facility] progress note    Interval h/o:- 40 year old male with history of diabetes. Reported to the ER today with increased pain in his perineum and fever. Fever started early this morning. No nausea or emesis. Work-up reveals leukocytosis and crepitus in the perineum. Urology saw patient and decided to take to the OR for debridement 12.26 Lines / Drains:  PIV  ET tube 12/24>>>12/27  Cultures:  Blood 12/24>>>  Sputum 12/24>>>  Urine 12/24>>> negative  Wound 12/24>>> GNR and GPC >> microaerophilic strep (?contaminant) Antibiotics:  Clinda 12/24>>>12.31  Zosyn 12/24>>> 12/28  Vancomycin 12/24>>> 12/28  Ceftriaxone 12/28 >>12.31 CLinda PO 132.31>>08/29/11 STOP DATE  Tests / Events:  12/24 to OR for debridement.  12/26 further debridement, Dr Jasmine December  Subjective: Well.  Urologist in room explaining [place].  No new issues.  Pt confirms has had Metformin induced diarrhea before. No other c/o   Treatment Team:  Molli Hazard, MD Objective: Vital signs in last 24 hours: Temp:  [98 F (36.7 C)-98.5 F (36.9 C)] 98.5 F (36.9 C) (12/31 0531) Pulse Rate:  [73-87] 73  (12/31 0531) Resp:  [18-19] 18  (12/31 0531) BP: (153-164)/(89-94) 153/94 mmHg (12/31 0531) SpO2:  [94 %-97 %] 95 % (12/31 0531) Weight change:   Intake/Output Summary (Last 24 hours) at 08/19/11 0835 Last data filed at 08/19/11 0536  Gross per 24 hour  Intake    770 ml  Output   2900 ml  Net  -2130 ml    BP 153/94  Pulse 73  Temp(Src) 98.5 F (36.9 C) (Oral)  Resp 18  Ht 6\' 1"  (1.854 m)  Wt 121.6 kg (268 lb 1.3 oz)  BMI 35.37 kg/m2  SpO2 95% General appearance: alert and cooperative Lungs: clear to auscultation bilaterally Abdomen: soft, non-tender; bowel sounds normal; no masses,  no organomegaly Male genitalia: deferred, patient had dressings on currently  Lab Results:  Basename 08/17/11 0600  NA 137  K 3.6  CL 106  CO2 17*  GLUCOSE 167*  BUN 11  CREATININE 1.04  CALCIUM  8.2*  MG --  PHOS --     Basename 08/17/11 0600  WBC 8.5  NEUTROABS --  HGB 11.8*  HCT 34.3*  MCV 82.5  PLT 236    Micro Results: Recent Results (from the past 240 hour(s))  CULTURE, BLOOD (ROUTINE X 2)     Status: Normal   Collection Time   08/12/11 10:50 AM      Component Value Range Status Comment   Specimen Description BLOOD LEFT ARM   Final    Special Requests BOTTLES DRAWN AEROBIC AND ANAEROBIC 4CC   Final    Setup Time [id number]   Final    Culture NO GROWTH 5 DAYS   Final    Report Status 08/18/2011 FINAL   Final   CULTURE, BLOOD (ROUTINE X 2)     Status: Normal   Collection Time   08/12/11 10:54 AM      Component Value Range Status Comment   Specimen Description BLOOD RIGHT ARM   Final    Special Requests BOTTLES DRAWN AEROBIC AND ANAEROBIC 10CC   Final    Setup Time [id number]   Final    Culture NO GROWTH 5 DAYS   Final    Report Status 08/18/2011 FINAL   Final   CULTURE, ROUTINE-ABSCESS     Status: Normal   Collection Time   08/12/11  5:11 PM      Component Value Range Status Comment  Specimen Description ABSCESS PERINEAL   Final    Special Requests PT ON VANC   Final    Gram Stain     Final    Value: NO WBC SEEN     NO SQUAMOUS EPITHELIAL CELLS SEEN     MODERATE GRAM NEGATIVE RODS     FEW GRAM POSITIVE COCCI     IN PAIRS   Culture     Final    Value: ABUNDANT MICROAEROPHILIC STREPTOCOCCI     Note: Standardized susceptibility testing for this organism is not available.   Report Status 08/15/2011 FINAL   Final   ANAEROBIC CULTURE     Status: Normal (Preliminary result)   Collection Time   08/12/11  5:11 PM      Component Value Range Status Comment   Specimen Description ABSCESS PERINEAL   Final    Special Requests PT ON VANC   Final    Gram Stain     Final    Value: NO WBC SEEN     NO SQUAMOUS EPITHELIAL CELLS SEEN     MODERATE GRAM NEGATIVE RODS     FEW GRAM POSITIVE COCCI     IN PAIRS   Culture     Final    Value: NO ANAEROBES ISOLATED;  CULTURE IN PROGRESS FOR 5 DAYS   Report Status PENDING   Incomplete   MRSA PCR SCREENING     Status: Normal   Collection Time   08/12/11  5:15 PM      Component Value Range Status Comment   MRSA by PCR NEGATIVE  NEGATIVE  Final   CULTURE, RESPIRATORY     Status: Normal   Collection Time   08/12/11  6:00 PM      Component Value Range Status Comment   Specimen Description ENDOTRACHEAL   Final    Special Requests NONE   Final    Gram Stain     Final    Value: RARE WBC PRESENT,BOTH PMN AND MONONUCLEAR     NO SQUAMOUS EPITHELIAL CELLS SEEN     NO ORGANISMS SEEN   Culture Non-Pathogenic Oropharyngeal-type Flora Isolated.   Final    Report Status 08/15/2011 FINAL   Final   URINE CULTURE     Status: Normal   Collection Time   08/12/11  6:42 PM      Component Value Range Status Comment   Specimen Description URINE, CLEAN CATCH   Final    Special Requests NONE   Final    Setup Time [id number]   Final    Colony Count NO GROWTH   Final    Culture NO GROWTH   Final    Report Status 08/14/2011 FINAL   Final    Medications: I have reviewed the patient's current medications. Scheduled Meds:    . cefTRIAXone (ROCEPHIN)  IV  2 g Intravenous Q24H  . clindamycin (CLEOCIN) IV  900 mg Intravenous Q8H  . collagenase  1 application Topical TID  . insulin aspart  0-15 Units Subcutaneous TID WC  . insulin aspart  0-5 Units Subcutaneous QHS  . lisinopril  20 mg Oral Daily  . metFORMIN  850 mg Oral BID WC  . pantoprazole  40 mg Oral Q1200   Continuous Infusions:    . sodium chloride     PRN Meds:.acetaminophen (TYLENOL) oral liquid 160 mg/5 mL, fentaNYL, insulin aspart, promethazine   Assessment/Plan: Patient Active Hospital Problem List: Fournier gangrene (08/16/2011)   Assessment: Rx PEr urology and per Plastic surgery  Diarrhea decreasing-appearance non-suggestive Cdiff and low risk Would attribute to Clindamycin-could also be metfromin-patient has not been compliant with this in the  past-will change to am amaryl with breakfast if persists.  Hold PCR C diff D/c IV Rocephin/CLinda Change to PO clinda.  Home tomorrow  Acute respiratory failure (08/16/2011)   Assessment: resolved-extubated 12.27  Septic shock (08/16/2011)   Assessment: resolved  Diabetes mellitus (08/16/2011)   Assessment: Very stable-blood sugars 100 range  Nausea-likely 2/2 to smells and abx-..hold further work-up   LOS: 7 days   Joaquina Nissen,JAI 08/19/2011, 8:35 AM

## 2011-08-19 NOTE — Progress Notes (Signed)
Urology Progress Note  Subjective:     No acute urologic events. Spoke with patient regarding circumcision.  He is scheduled with Dr. Migdalia Dk to return to the OR for wound care/closure/skin grafting.  We discussed harvesting the foreskin for use as a skin graft.  We discussed the procedure of circumcision along with the risks, benefits, alternatives, and likelihood of achieving goals.  I explained that circumcision is not necessary for his treatment; that is is one of many options for skin harvest.  He wishes to proceed.  He had the opportunity to answer questions to his stated satisfaction.   Objective:  Patient Vitals for the past 24 hrs:  BP Temp Temp src Pulse Resp SpO2  08/19/11 1422 142/81 mmHg 98.5 F (36.9 C) - 83  18  93 %  08/19/11 0531 153/94 mmHg 98.5 F (36.9 C) - 73  18  95 %  08/18/11 2124 164/89 mmHg 98 F (36.7 C) Oral 87  19  94 %    Physical Exam: General:  No acute distress, awake  Genitourinary:  Good granulation tissue around edges and base of wound. Foley:  In placed draining clear yellow urine.    I/O last 3 completed shifts: In: 22 [P.O.:720; IV Piggyback:50] Out: 4600 [Urine:4600]     Assessment: Fournier's gangrene.  No further GU wound debridement indicated. Plan: -Foley discontinued as it is not needed for skin grafting. -Wound care recommendations and antibiotics per Dr. Migdalia Dk.     -We discussed and she recommends Keflex and dressing changes TID. -No need for GU office follow up.   Rolan Bucco, MD [phone]

## 2011-08-19 NOTE — Progress Notes (Signed)
Nutrition Follow-up  Pt extubated 12/27. S/p debridement for fournier's gangrene of perineal/testicular area 12/26.  Diet Order:  Carbohydrate Modified Medium Calorie. PO intake 50-100% per flowsheet records.  Re-estimated Daily Nutrition Needs: 2,100-2,200 kcals, 105-115 gm protein  Meds: Scheduled Meds:   . cefTRIAXone (ROCEPHIN)  IV  2 g Intravenous Q24H  . clindamycin (CLEOCIN) IV  900 mg Intravenous Q8H  . collagenase  1 application Topical TID  . insulin aspart  0-15 Units Subcutaneous TID WC  . insulin aspart  0-5 Units Subcutaneous QHS  . lisinopril  20 mg Oral Daily  . metFORMIN  850 mg Oral BID WC  . pantoprazole  40 mg Oral Q1200   Continuous Infusions:   . sodium chloride 10 mL/hr (08/19/11 1037)   PRN Meds:.acetaminophen (TYLENOL) oral liquid 160 mg/5 mL, fentaNYL, insulin aspart, promethazine  Labs:  CMP     Component Value Date/Time   NA 137 08/17/2011 0600   K 3.6 08/17/2011 0600   CL 106 08/17/2011 0600   CO2 17* 08/17/2011 0600   GLUCOSE 167* 08/17/2011 0600   BUN 11 08/17/2011 0600   CREATININE 1.04 08/17/2011 0600   CALCIUM 8.2* 08/17/2011 0600   GFRNONAA 88* 08/17/2011 0600   GFRAA >90 08/17/2011 0600     Intake/Output Summary (Last 24 hours) at 08/19/11 1520 Last data filed at 08/19/11 1300  Gross per 24 hour  Intake    890 ml  Output   2900 ml  Net  -2010 ml    Weight Status:  121.6 kg (12/29) -- weight trending up  Nutrition Dx:  Inadequate Oral Intake now r/t nausea as evidenced by variable PO intake, improving  Goal:  Meal consumption & supplemental intake to be consistently >75% to promote wound healing Monitor: PO intake, weight, labs, I/O's  Intervention:    Add Prostat liquid protein PO TID (72 kcals, 15 gm protein per 30 ml dose)  RD to follow for nutrition care plan    Lady Deutscher Pager #:  [phone]

## 2011-08-19 NOTE — Progress Notes (Signed)
ANTIBIOTIC CONSULT NOTE - FOLLOW UP  Pharmacy Consult for Clindamycin Indication: Fournier's gangrene s/p debridement  No Known Allergies  Patient Measurements: Height: 6\' 1"  (185.4 cm) Weight: 268 lb 1.3 oz (121.6 kg) IBW/kg (Calculated) : 79.9   Vital Signs: Temp: 98.5 F (36.9 C) (12/31 0531) BP: 153/94 mmHg (12/31 0531) Pulse Rate: 73  (12/31 0531) Intake/Output from previous day: 12/30 0701 - 12/31 0700 In: 770 [P.O.:720; IV Piggyback:50] Out: 2900 [Urine:2900] Intake/Output from this shift: Total I/O In: 240 [P.O.:240] Out: -   Labs:  Basename 08/17/11 0600  WBC 8.5  HGB 11.8*  PLT 236  LABCREA --  CREATININE 1.04   Estimated Creatinine Clearance: 127.7 ml/min (by C-G formula based on Cr of 1.04). No results found for this basename: VANCOTROUGH:2,VANCOPEAK:2,VANCORANDOM:2,GENTTROUGH:2,GENTPEAK:2,GENTRANDOM:2,TOBRATROUGH:2,TOBRAPEAK:2,TOBRARND:2,AMIKACINPEAK:2,AMIKACINTROU:2,AMIKACIN:2, in the last 72 hours   Microbiology: Recent Results (from the past 720 hour(s))  CULTURE, BLOOD (ROUTINE X 2)     Status: Normal   Collection Time   08/12/11 10:50 AM      Component Value Range Status Comment   Specimen Description BLOOD LEFT ARM   Final    Special Requests BOTTLES DRAWN AEROBIC AND ANAEROBIC 4CC   Final    Setup Time [id number]   Final    Culture NO GROWTH 5 DAYS   Final    Report Status 08/18/2011 FINAL   Final   CULTURE, BLOOD (ROUTINE X 2)     Status: Normal   Collection Time   08/12/11 10:54 AM      Component Value Range Status Comment   Specimen Description BLOOD RIGHT ARM   Final    Special Requests BOTTLES DRAWN AEROBIC AND ANAEROBIC 10CC   Final    Setup Time [id number]   Final    Culture NO GROWTH 5 DAYS   Final    Report Status 08/18/2011 FINAL   Final   CULTURE, ROUTINE-ABSCESS     Status: Normal   Collection Time   08/12/11  5:11 PM      Component Value Range Status Comment   Specimen Description ABSCESS PERINEAL   Final    Special  Requests PT ON VANC   Final    Gram Stain     Final    Value: NO WBC SEEN     NO SQUAMOUS EPITHELIAL CELLS SEEN     MODERATE GRAM NEGATIVE RODS     FEW GRAM POSITIVE COCCI     IN PAIRS   Culture     Final    Value: ABUNDANT MICROAEROPHILIC STREPTOCOCCI     Note: Standardized susceptibility testing for this organism is not available.   Report Status 08/15/2011 FINAL   Final   ANAEROBIC CULTURE     Status: Normal   Collection Time   08/12/11  5:11 PM      Component Value Range Status Comment   Specimen Description ABSCESS PERINEAL   Final    Special Requests PT ON VANC   Final    Gram Stain     Final    Value: NO WBC SEEN     NO SQUAMOUS EPITHELIAL CELLS SEEN     MODERATE GRAM NEGATIVE RODS     FEW GRAM POSITIVE COCCI     IN PAIRS   Culture     Final    Value: ABUNDANT BACTEROIDES FRAGILIS     Note: BETA LACTAMASE POSITIVE   Report Status 08/19/2011 FINAL   Final   MRSA PCR SCREENING     Status: Normal   Collection  Time   08/12/11  5:15 PM      Component Value Range Status Comment   MRSA by PCR NEGATIVE  NEGATIVE  Final   CULTURE, RESPIRATORY     Status: Normal   Collection Time   08/12/11  6:00 PM      Component Value Range Status Comment   Specimen Description ENDOTRACHEAL   Final    Special Requests NONE   Final    Gram Stain     Final    Value: RARE WBC PRESENT,BOTH PMN AND MONONUCLEAR     NO SQUAMOUS EPITHELIAL CELLS SEEN     NO ORGANISMS SEEN   Culture Non-Pathogenic Oropharyngeal-type Flora Isolated.   Final    Report Status 08/15/2011 FINAL   Final   URINE CULTURE     Status: Normal   Collection Time   08/12/11  6:42 PM      Component Value Range Status Comment   Specimen Description URINE, CLEAN CATCH   Final    Special Requests NONE   Final    Setup Time [id number]   Final    Colony Count NO GROWTH   Final    Culture NO GROWTH   Final    Report Status 08/14/2011 FINAL   Final       Assessment: 40 yo male with gangrene s/p debridement on  Clindamycin.  Goal of Therapy:  Appropriate antibiotic therapy  Plan:  Continue clindamycin 90 mg IV q8 hours  Edward [name] 08/19/2011,11:10 AM

## 2011-08-19 NOTE — Progress Notes (Unsigned)
Patient ID: Edward [name], male   DOB: 1970-10-24, 40 y.o.   MRN: [id number] Subjective:    Patient ID: Edward [name] is a 40 y.o. male.  Chief Complaint: HPI Edward [name] is here for treatment of fournier's gangrene.  He has been getting IV antibiotics and dressing changes.  Things have improved over the past week and he is doing much better.  Cultures are back and in the chart.    Social History   Occupational History  . Not on file.   Social History Main Topics  . Smoking status: Current Everyday Smoker  . Smokeless tobacco: Never Used  . Alcohol Use: Yes  . Drug Use: No  . Sexually Active:     Review of Systems  All other systems reviewed and are negative.     Objective:   Ortho Exam  PERLA EOMI Breathing unlabored Dressing in place     Assessment:     1. Fournier gangrene        Plan:     Discharge to home with dressing changes 2-3 times a day.  Wet to dry with normal saline. Plan to go to the OR for closure and grafting in the next week. Follow up in the office in one week. [phone]

## 2011-08-19 NOTE — Progress Notes (Signed)
Physical Therapy Treatment Patient Details Name: Edward [name] MRN: [id number] DOB: 09-13-1970 Today's Date: 08/19/2011  PT Assessment/Plan  PT - Assessment/Plan Comments on Treatment Session: Pt with excellent improvement from prior sessions. No need for assistance or RW today. Pt has met all goals and does not require further therapy. Encouraged daily hall ambulation. PT Plan: Discharge plan needs to be updated Follow Up Recommendations: None Equipment Recommended: None recommended by PT PT Goals  Acute Rehab PT Goals PT Goal: Supine/Side to Sit - Progress: Met PT Goal: Sit to Supine/Side - Progress: Met PT Goal: Sit to Stand - Progress: Met PT Goal: Stand to Sit - Progress: Met PT Goal: Ambulate - Progress: Met  PT Treatment Precautions/Restrictions  Precautions Precaution Comments: none Restrictions Weight Bearing Restrictions: No Mobility (including Balance) Bed Mobility Supine to Sit: 7: Independent;HOB flat Sitting - Scoot to Edge of Bed: 7: Independent Transfers Sit to Stand: 7: Independent;From bed Stand to Sit: 7: Independent;To chair/3-in-1 Ambulation/Gait Ambulation/Gait Assistance: 7: Independent Ambulation/Gait Assistance Details (indicate cue type and reason): Pt with excellent gait, speed and no LOB Ambulation Distance (Feet): 700 Feet Assistive device: None Gait Pattern: Within Functional Limits Stairs: No  Posture/Postural Control Posture/Postural Control: No significant limitations Balance Balance Assessed: Yes Dynamic Standing Balance Dynamic Standing - Level of Assistance: 7: Independent Exercise    End of Session PT - End of Session Equipment Utilized During Treatment: Gait belt Activity Tolerance: Patient tolerated treatment well Patient left: in chair;with call bell in reach;with family/visitor present Nurse Communication: Mobility status for ambulation General Behavior During Session: [facility] for tasks performed Cognition: [facility] for tasks  performed  Melford Aase 08/19/2011, 8:39 AM Lanetta Inch, [place]

## 2011-08-19 NOTE — Progress Notes (Signed)
Physical Therapy Discharge Note: Pt has made excellent progress and has returned to baseline functional level without further need for therapy and no DME needs. Pt aware and agreeable. Recommend daily hall ambulation. Signing off. Thanks. Lanetta Inch, [place]

## 2011-08-20 LAB — GLUCOSE, CAPILLARY
Glucose-Capillary: 166 mg/dL — ABNORMAL HIGH (ref 70–99)
Glucose-Capillary: 145 mg/dL — ABNORMAL HIGH (ref 70–99)

## 2011-08-20 MED ORDER — OXYCODONE-ACETAMINOPHEN 7.5-325 MG PO TABS: 1.0000 | ORAL_TABLET | ORAL | Status: AC | PRN

## 2011-08-20 MED ORDER — COLLAGENASE 250 UNIT/GM EX OINT: TOPICAL_OINTMENT | CUTANEOUS | Status: DC

## 2011-08-20 MED ORDER — CEPHALEXIN 750 MG PO CAPS: 750.0000 mg | ORAL_CAPSULE | Freq: Four times a day (QID) | ORAL | Status: DC

## 2011-08-20 NOTE — Discharge Summary (Signed)
Physician Discharge Summary  Patient ID: Edward [name] MRN: [id number] DOB/AGE: September 25, 1970 41 y.o.  Admit date: 08/12/2011 Discharge date: 08/20/2011  Admission Diagnoses: Fournier's gangrene  Discharge Diagnoses:  Active Problems:  Fournier gangrene  Acute respiratory failure  Septic shock  Diabetes mellitus   Discharged Condition: good  Hospital Course: 41 year old male with history of diabetes. Reported to the ER 12.24 with increased pain in his perineum and fever. Fever. No nausea or emesis. Work-up reveals leukocytosis and crepitus in the perineum. Urology saw patient and decided to take to the OR for debridement 12.24 Was extubated 12.27 and transferred to the general medicine service. Urology followed the patient.  Plastic surgeon has conferred with urologist and elected to follow patient as an outpatient, as patient has had his testicles implanted in thighs and will need primary closure of the scrotum first.  He was accepted by [facility] 12.29. he experienced significant N/v during the time that he was under my care-he was noted to have this issue because of metfromin whiohc he is not very compliant with, but this resolved.  Patient was very stable during the hospital stay otherwise and will finish antibiotics and retunr to see dr. Migdalia Dk as an out-patient.  Lines / Drains:  PIV  ET tube 12/24>>>12/27  Cultures:  Blood 12/24>>>  Sputum 12/24>>>  Urine 12/24>>> negative  Wound 12/24>>> GNR and GPC >> microaerophilic strep (?contaminant)  Antibiotics:  Clinda 12/24>>>12.31  Zosyn 12/24>>> 12/28  Vancomycin 12/24>>> 12/28  Ceftriaxone 12/28 >>12.31  Will finish a 6 more day course of Keflex Tests / Events:  12/24 to OR for debridement.  12/26 further debridement, Dr Jasmine December   Consults: urology and plastic surgery  Significant Diagnostic Studies: labs: Leucocytosis and Xrays with some edema initally which cleared up  Treatments: IV hydration, insulin:  Humalog and surgery: Per Urologist  Discharge Exam: Blood pressure 178/91, pulse 64, temperature 98.3 F (36.8 C), temperature source Oral, resp. rate 18, height 6\' 1"  (1.854 m), weight 121.6 kg (268 lb 1.3 oz), SpO2 97.00%. General appearance: alert and appears stated age Throat: lips, mucosa, and tongue normal; teeth and gums normal Neck: supple, symmetrical, trachea midline and thyroid not enlarged, symmetric, no tenderness/mass/nodules Resp: clear to auscultation bilaterally Cardio: regular rate and rhythm, S1, S2 normal, no murmur, click, rub or gallop Male genitalia: normal, deferred Incision/Wound:deffered  Disposition: Intermediate Care Facility   Medication List  As of 08/20/2011 10:03 AM   START taking these medications         cephALEXin 750 MG capsule   Commonly known as: KEFLEX   Take 1 capsule (750 mg total) by mouth 4 (four) times daily.      collagenase ointment   Commonly known as: SANTYL   Apply to perineum      oxyCODONE-acetaminophen 7.5-325 MG per tablet   Commonly known as: PERCOCET   Take 1 tablet by mouth every 4 (four) hours as needed for pain.         CONTINUE taking these medications         ibuprofen 200 MG tablet   Commonly known as: ADVIL,MOTRIN      lisinopril 20 MG tablet   Commonly known as: PRINIVIL,ZESTRIL      metFORMIN 850 MG tablet   Commonly known as: GLUCOPHAGE          Where to get your medications    These are the prescriptions that you need to pick up.   You may get these medications from  any pharmacy.         cephALEXin 750 MG capsule   collagenase ointment   oxyCODONE-acetaminophen 7.5-325 MG per tablet           Follow-up Information    Follow up with [facility], DO. (january 14th appt is scheduled)    Contact information:   [address] [place] [phone]          Signed: Verneita Griffes 08/20/2011, 10:03 AM

## 2011-08-20 NOTE — Progress Notes (Signed)
Patient ID: Edward [name], male   DOB: 1971-01-28, 41 y.o.   MRN: [id number] 6 Days Post-Op  Subjective: Edward [name] is doing well.   He is s/p scrotal debridement for Fornier's gangrene and has had his testes placed in thigh pouches.  He has no specific complaints. ROS: General ROS: negative Genito-Urinary ROS: negative  Objective: Vital signs in last 24 hours: Temp:  [98.3 F (36.8 C)-98.5 F (36.9 C)] 98.3 F (36.8 C) (01/01 0600) Pulse Rate:  [64-83] 64  (01/01 0600) Resp:  [18] 18  (01/01 0600) BP: (142-178)/(81-91) 178/91 mmHg (01/01 0600) SpO2:  [93 %-97 %] 97 % (01/01 0600)  Intake/Output from previous day: 12/31 0701 - 01/01 0700 In: 840 [P.O.:840] Out: 700 [Urine:700] Intake/Output this shift:    General appearance: alert and no distress Male genitalia: normal, His testes are in thigh pouches.  There is no evidence of progressive infection.  His wound is granulating well.  Lab Results:  No results found for this basename: WBC:2,HGB:2,HCT:2,PLT:2 in the last 72 hours BMET No results found for this basename: NA:2,K:2,CL:2,CO2:2,GLUCOSE:2,BUN:2,CREATININE:2,CALCIUM:2 in the last 72 hours PT/INR No results found for this basename: LABPROT:2,INR:2 in the last 72 hours ABG No results found for this basename: PHART:2,PCO2:2,PO2:2,HCO3:2 in the last 72 hours  Studies/Results: No results found.  Anti-infectives: Anti-infectives     Start     Dose/Rate Route Frequency Ordered Stop   08/20/11 0000   cephALEXin (KEFLEX) 750 MG capsule        750 mg Oral 4 times daily 08/20/11 0957 08/30/11 2359   08/17/11 1000   cefTRIAXone (ROCEPHIN) 2 g in dextrose 5 % 50 mL IVPB        2 g 100 mL/hr over 30 Minutes Intravenous Every 24 hours 08/16/11 1229     08/16/11 1400   vancomycin (VANCOCIN) 1,250 mg in sodium chloride 0.9 % 250 mL IVPB  Status:  Discontinued        1,250 mg 166.7 mL/hr over 90 Minutes Intravenous Every 8 hours 08/16/11 0623 08/16/11 1006   08/16/11 1300    cefTRIAXone (ROCEPHIN) 1 g in dextrose 5 % 50 mL IVPB        1 g 100 mL/hr over 30 Minutes Intravenous  Once 08/16/11 1229 08/16/11 1359   08/16/11 1100   cefTRIAXone (ROCEPHIN) 1 g in dextrose 5 % 50 mL IVPB  Status:  Discontinued        1 g 100 mL/hr over 30 Minutes Intravenous Every 24 hours 08/16/11 1018 08/16/11 1228   08/14/11 1730   vancomycin (VANCOCIN) 1,750 mg in sodium chloride 0.9 % 500 mL IVPB  Status:  Discontinued        1,750 mg 250 mL/hr over 120 Minutes Intravenous Every 12 hours 08/14/11 1647 08/16/11 0623   08/14/11 1452   polymyxin B 500,000 Units, bacitracin 50,000 Units in sodium chloride irrigation 0.9 % 500 mL irrigation  Status:  Discontinued          As needed 08/14/11 1452 08/14/11 1555   08/13/11 0430   vancomycin (VANCOCIN) IVPB 1000 mg/200 mL premix  Status:  Discontinued        1,000 mg 200 mL/hr over 60 Minutes Intravenous Every 12 hours 08/12/11 1724 08/14/11 1647   08/13/11 0400   vancomycin (VANCOCIN) IVPB 1000 mg/200 mL premix  Status:  Discontinued        1,000 mg 200 mL/hr over 60 Minutes Intravenous Every 12 hours 08/12/11 1520 08/12/11 1724   08/12/11 2100  piperacillin-tazobactam (ZOSYN) IVPB 3.375 g  Status:  Discontinued        3.375 g 12.5 mL/hr over 240 Minutes Intravenous 3 times per day 08/12/11 1725 08/16/11 1018   08/12/11 2000   clindamycin (CLEOCIN) IVPB 900 mg        900 mg 100 mL/hr over 30 Minutes Intravenous 3 times per day 08/12/11 1725     08/12/11 1800   clindamycin (CLEOCIN) IVPB 900 mg  Status:  Discontinued        900 mg 100 mL/hr over 30 Minutes Intravenous 4 times per day 08/12/11 1508 08/12/11 1509   08/12/11 1800   piperacillin-tazobactam (ZOSYN) IVPB 3.375 g  Status:  Discontinued        3.375 g 12.5 mL/hr over 240 Minutes Intravenous 4 times per day 08/12/11 1508 08/12/11 1508   08/12/11 1623   polymyxin B 500,000 Units, bacitracin 50,000 Units in sodium chloride irrigation 0.9 % 500 mL irrigation  Status:   Discontinued          As needed 08/12/11 1624 08/12/11 1656   08/12/11 1515   vancomycin (VANCOCIN) IVPB 1000 mg/200 mL premix     Comments: Please send to OR (tube station 27)      1,000 mg 200 mL/hr over 60 Minutes Intravenous  Once 08/12/11 1507 08/12/11 1825   08/12/11 1330   vancomycin (VANCOCIN) IVPB 1000 mg/200 mL premix        1,000 mg 200 mL/hr over 60 Minutes Intravenous  Once 08/12/11 1316 08/12/11 1457   08/12/11 1300  piperacillin-tazobactam (ZOSYN) IVPB 3.375 g       3.375 g 12.5 mL/hr over 240 Minutes Intravenous  Once 08/12/11 1248 08/12/11 1656          Assessment: s/p Procedure(s): MINOR INCISION AND DRAINAGE OF ABSCESS.  EXTENSIVE SCROTAL DEBRIDEMENT. PLACEMENT OF TESTES IN THIGH POUCHES. He is doing well with current wound management and has no complaints.  Plan: Continue current care.   LOS: 8 days    Sergio Hobart J 08/20/2011

## 2011-08-23 ENCOUNTER — Other Ambulatory Visit: Payer: Self-pay | Admitting: Urology

## 2011-08-29 ENCOUNTER — Encounter (HOSPITAL_BASED_OUTPATIENT_CLINIC_OR_DEPARTMENT_OTHER): Payer: Self-pay | Admitting: *Deleted

## 2011-08-29 NOTE — Progress Notes (Signed)
Pt just out of hospital Septic shock on vent-diabetic Better Will need istat dos-ekg done 1/13

## 2011-08-30 ENCOUNTER — Other Ambulatory Visit: Payer: Self-pay | Admitting: Plastic Surgery

## 2011-08-30 ENCOUNTER — Encounter (HOSPITAL_BASED_OUTPATIENT_CLINIC_OR_DEPARTMENT_OTHER): Payer: Self-pay | Admitting: Plastic Surgery

## 2011-08-30 DIAGNOSIS — N493 Fournier gangrene: Secondary | ICD-10-CM

## 2011-09-02 ENCOUNTER — Ambulatory Visit (HOSPITAL_COMMUNITY): Admission: RE | Admit: 2011-09-02 | Payer: 59 | Source: Ambulatory Visit | Admitting: Plastic Surgery

## 2011-09-02 ENCOUNTER — Encounter (HOSPITAL_BASED_OUTPATIENT_CLINIC_OR_DEPARTMENT_OTHER): Payer: Self-pay

## 2011-09-02 ENCOUNTER — Encounter (HOSPITAL_BASED_OUTPATIENT_CLINIC_OR_DEPARTMENT_OTHER): Payer: Self-pay | Admitting: Certified Registered"

## 2011-09-02 ENCOUNTER — Ambulatory Visit (HOSPITAL_BASED_OUTPATIENT_CLINIC_OR_DEPARTMENT_OTHER): Payer: 59 | Admitting: Certified Registered"

## 2011-09-02 ENCOUNTER — Encounter (HOSPITAL_BASED_OUTPATIENT_CLINIC_OR_DEPARTMENT_OTHER)
Admission: RE | Disposition: A | Discharge status: Home / Self Care | Payer: Self-pay | Source: Ambulatory Visit | Attending: Plastic Surgery

## 2011-09-02 ENCOUNTER — Ambulatory Visit (HOSPITAL_BASED_OUTPATIENT_CLINIC_OR_DEPARTMENT_OTHER)
Admission: RE | Admit: 2011-09-02 | Discharge: 2011-09-02 | Disposition: A | Discharge status: Home / Self Care | Payer: 59 | Source: Ambulatory Visit | Attending: Plastic Surgery | Admitting: Plastic Surgery

## 2011-09-02 ENCOUNTER — Ambulatory Visit (HOSPITAL_BASED_OUTPATIENT_CLINIC_OR_DEPARTMENT_OTHER): Admit: 2011-09-02 | Payer: Self-pay | Admitting: Plastic Surgery

## 2011-09-02 ENCOUNTER — Encounter (HOSPITAL_BASED_OUTPATIENT_CLINIC_OR_DEPARTMENT_OTHER): Payer: Self-pay | Admitting: *Deleted

## 2011-09-02 ENCOUNTER — Encounter (HOSPITAL_COMMUNITY): Admission: RE | Payer: Self-pay | Source: Ambulatory Visit

## 2011-09-02 ENCOUNTER — Encounter (HOSPITAL_BASED_OUTPATIENT_CLINIC_OR_DEPARTMENT_OTHER): Payer: Self-pay | Admitting: Plastic Surgery

## 2011-09-02 DIAGNOSIS — E119 Type 2 diabetes mellitus without complications: Secondary | ICD-10-CM | POA: Insufficient documentation

## 2011-09-02 DIAGNOSIS — N478 Other disorders of prepuce: Secondary | ICD-10-CM | POA: Insufficient documentation

## 2011-09-02 DIAGNOSIS — L98499 Non-pressure chronic ulcer of skin of other sites with unspecified severity: Secondary | ICD-10-CM | POA: Insufficient documentation

## 2011-09-02 DIAGNOSIS — N471 Phimosis: Secondary | ICD-10-CM | POA: Insufficient documentation

## 2011-09-02 DIAGNOSIS — I10 Essential (primary) hypertension: Secondary | ICD-10-CM | POA: Insufficient documentation

## 2011-09-02 DIAGNOSIS — H02409 Unspecified ptosis of unspecified eyelid: Secondary | ICD-10-CM

## 2011-09-02 DIAGNOSIS — N493 Fournier gangrene: Secondary | ICD-10-CM

## 2011-09-02 DIAGNOSIS — N508 Other specified disorders of male genital organs: Secondary | ICD-10-CM | POA: Insufficient documentation

## 2011-09-02 HISTORY — PX: CIRCUMCISION: SHX1350

## 2011-09-02 HISTORY — PX: I & D EXTREMITY: SHX5045

## 2011-09-02 LAB — POCT I-STAT, CHEM 8
BUN: 18 mg/dL (ref 6–23)
Chloride: 105 mEq/L (ref 96–112)
Creatinine, Ser: 1 mg/dL (ref 0.50–1.35)
Potassium: 4 mEq/L (ref 3.5–5.1)
Sodium: 139 mEq/L (ref 135–145)

## 2011-09-02 SURGERY — IRRIGATION AND DEBRIDEMENT EXTREMITY
Anesthesia: General | Site: Penis | Wound class: Contaminated

## 2011-09-02 SURGERY — IRRIGATION AND DEBRIDEMENT EXTREMITY
Anesthesia: General

## 2011-09-02 SURGERY — APPLICATION, GRAFT, SKIN, SPLIT-THICKNESS
Anesthesia: General | Laterality: Bilateral

## 2011-09-02 MED ORDER — CEPHALEXIN 500 MG PO CAPS: 500.0000 mg | ORAL_CAPSULE | Freq: Four times a day (QID) | ORAL | Status: AC

## 2011-09-02 MED ORDER — PROPOFOL 10 MG/ML IV EMUL
INTRAVENOUS | Status: DC | PRN
  Administered 2011-09-02: 200 mg via INTRAVENOUS

## 2011-09-02 MED ORDER — CHLORHEXIDINE GLUCONATE 4 % EX LIQD: 1.0000 "application " | Freq: Once | CUTANEOUS | Status: DC

## 2011-09-02 MED ORDER — ONDANSETRON HCL 4 MG/2ML IJ SOLN
INTRAMUSCULAR | Status: DC | PRN
  Administered 2011-09-02: 4 mg via INTRAVENOUS

## 2011-09-02 MED ORDER — HYDROCODONE-ACETAMINOPHEN 5-500 MG PO TABS: ORAL_TABLET | ORAL | Status: DC

## 2011-09-02 MED ORDER — BUPIVACAINE HCL (PF) 0.25 % IJ SOLN
INTRAMUSCULAR | Status: DC | PRN
  Administered 2011-09-02: 10 mL

## 2011-09-02 MED ORDER — LIDOCAINE HCL (CARDIAC) 20 MG/ML IV SOLN
INTRAVENOUS | Status: DC | PRN
  Administered 2011-09-02: 60 mg via INTRAVENOUS

## 2011-09-02 MED ORDER — FIBRIN SEALANT COMPONENT 5 ML EX KIT
PACK | CUTANEOUS | Status: DC | PRN
  Administered 2011-09-02: 1

## 2011-09-02 MED ORDER — PROMETHAZINE HCL 25 MG/ML IJ SOLN: 6.2500 mg | INTRAMUSCULAR | Status: DC | PRN

## 2011-09-02 MED ORDER — MIDAZOLAM HCL 5 MG/5ML IJ SOLN
INTRAMUSCULAR | Status: DC | PRN
  Administered 2011-09-02: 1.5 mg via INTRAVENOUS

## 2011-09-02 MED ORDER — HYDROMORPHONE HCL PF 1 MG/ML IJ SOLN
0.2500 mg | INTRAMUSCULAR | Status: DC | PRN
  Administered 2011-09-02 (×3): 0.5 mg via INTRAVENOUS

## 2011-09-02 MED ORDER — FENTANYL CITRATE 0.05 MG/ML IJ SOLN
INTRAMUSCULAR | Status: DC | PRN
  Administered 2011-09-02: 25 ug via INTRAVENOUS
  Administered 2011-09-02: 100 ug via INTRAVENOUS
  Administered 2011-09-02 (×2): 50 ug via INTRAVENOUS
  Administered 2011-09-02: 25 ug via INTRAVENOUS
  Administered 2011-09-02: 50 ug via INTRAVENOUS

## 2011-09-02 MED ORDER — CEFAZOLIN SODIUM 1-5 GM-% IV SOLN
1.0000 g | INTRAVENOUS | Status: AC
  Administered 2011-09-02: 2 g via INTRAVENOUS

## 2011-09-02 MED ORDER — LACTATED RINGERS IV SOLN
INTRAVENOUS | Status: DC
  Administered 2011-09-02 (×4): via INTRAVENOUS

## 2011-09-02 MED ORDER — SODIUM CHLORIDE 0.9 % IR SOLN
Status: DC | PRN
  Administered 2011-09-02: 13:00:00

## 2011-09-02 MED ORDER — MEPERIDINE HCL 25 MG/ML IJ SOLN: 6.2500 mg | INTRAMUSCULAR | Status: DC | PRN

## 2011-09-02 SURGICAL SUPPLY — 111 items
ADH SKN CLS APL DERMABOND .7 (GAUZE/BANDAGES/DRESSINGS) ×1
APL SKNCLS STERI-STRIP NONHPOA (GAUZE/BANDAGES/DRESSINGS)
BAG DECANTER FOR FLEXI CONT (MISCELLANEOUS) ×1 IMPLANT
BALL CTTN LRG ABS STRL LF (GAUZE/BANDAGES/DRESSINGS) ×4
BANDAGE ELASTIC 3 VELCRO ST LF (GAUZE/BANDAGES/DRESSINGS) IMPLANT
BANDAGE ELASTIC 4 VELCRO ST LF (GAUZE/BANDAGES/DRESSINGS) IMPLANT
BANDAGE ELASTIC 6 VELCRO ST LF (GAUZE/BANDAGES/DRESSINGS) IMPLANT
BANDAGE GAUZE ELAST BULKY 4 IN (GAUZE/BANDAGES/DRESSINGS) IMPLANT
BENZOIN TINCTURE PRP APPL 2/3 (GAUZE/BANDAGES/DRESSINGS) IMPLANT
BLADE MINI RND TIP GREEN BEAV (BLADE) IMPLANT
BLADE SURG 10 STRL SS (BLADE) IMPLANT
BLADE SURG 15 STRL LF DISP TIS (BLADE) ×2 IMPLANT
BLADE SURG 15 STRL SS (BLADE) ×4
BNDG CMPR 9X4 STRL LF SNTH (GAUZE/BANDAGES/DRESSINGS)
BNDG COHESIVE 1X5 TAN STRL LF (GAUZE/BANDAGES/DRESSINGS) ×1 IMPLANT
BNDG COHESIVE 4X5 TAN STRL (GAUZE/BANDAGES/DRESSINGS) IMPLANT
BNDG ESMARK 4X9 LF (GAUZE/BANDAGES/DRESSINGS) IMPLANT
CANISTER OMNI JUG 16 LITER (MISCELLANEOUS) IMPLANT
CANISTER SUCTION 1200CC (MISCELLANEOUS) ×1 IMPLANT
CANISTER SUCTION 2500CC (MISCELLANEOUS) IMPLANT
CHLORAPREP W/TINT 26ML (MISCELLANEOUS) ×1 IMPLANT
CLOTH BEACON ORANGE TIMEOUT ST (SAFETY) ×3 IMPLANT
CORDS BIPOLAR (ELECTRODE) IMPLANT
COTTONBALL LRG STERILE PKG (GAUZE/BANDAGES/DRESSINGS) ×4 IMPLANT
COVER MAYO STAND STRL (DRAPES) ×2 IMPLANT
COVER SURGICAL LIGHT HANDLE (MISCELLANEOUS) ×2 IMPLANT
COVER TABLE BACK 60X90 (DRAPES) ×2 IMPLANT
DECANTER SPIKE VIAL GLASS SM (MISCELLANEOUS) IMPLANT
DERMABOND ADVANCED (GAUZE/BANDAGES/DRESSINGS) ×1
DERMABOND ADVANCED .7 DNX12 (GAUZE/BANDAGES/DRESSINGS) IMPLANT
DRAIN PENROSE 1/2X12 LTX STRL (WOUND CARE) IMPLANT
DRAPE EXTREMITY T 121X128X90 (DRAPE) IMPLANT
DRAPE INCISE IOBAN 66X45 STRL (DRAPES) IMPLANT
DRSG EMULSION OIL 3X3 NADH (GAUZE/BANDAGES/DRESSINGS) IMPLANT
DRSG PAD ABDOMINAL 8X10 ST (GAUZE/BANDAGES/DRESSINGS) ×1 IMPLANT
ELECT COATED BLADE 2.86 ST (ELECTRODE) IMPLANT
ELECT NDL TIP 2.8 STRL (NEEDLE) IMPLANT
ELECT NEEDLE TIP 2.8 STRL (NEEDLE) IMPLANT
ELECT REM PT RETURN 9FT ADLT (ELECTROSURGICAL) ×2
ELECTRODE REM PT RTRN 9FT ADLT (ELECTROSURGICAL) ×2 IMPLANT
GAUZE SPONGE 4X4 12PLY STRL LF (GAUZE/BANDAGES/DRESSINGS) IMPLANT
GAUZE VASELINE 1X8 (GAUZE/BANDAGES/DRESSINGS) ×3 IMPLANT
GAUZE XEROFORM 1X8 LF (GAUZE/BANDAGES/DRESSINGS) IMPLANT
GAUZE XEROFORM 5X9 LF (GAUZE/BANDAGES/DRESSINGS) ×1 IMPLANT
GLOVE BIO SURGEON STRL SZ 6.5 (GLOVE) ×3 IMPLANT
GLOVE BIOGEL PI IND STRL 7.5 (GLOVE) ×2 IMPLANT
GLOVE BIOGEL PI INDICATOR 7.5 (GLOVE) ×2
GLOVE ECLIPSE 7.0 STRL STRAW (GLOVE) ×2 IMPLANT
GLOVE ECLIPSE 7.5 STRL STRAW (GLOVE) ×2 IMPLANT
GLOVE SKINSENSE NS SZ7.0 (GLOVE) ×1
GLOVE SKINSENSE STRL SZ7.0 (GLOVE) IMPLANT
GOWN PREVENTION PLUS XLARGE (GOWN DISPOSABLE) ×5 IMPLANT
HANDPIECE INTERPULSE COAX TIP (DISPOSABLE)
IV NS IRRIG 3000ML ARTHROMATIC (IV SOLUTION) IMPLANT
KIT BASIN OR (CUSTOM PROCEDURE TRAY) ×2 IMPLANT
NDL HYPO 25X1 1.5 SAFETY (NEEDLE) IMPLANT
NDL HYPO 30GX1 BEV (NEEDLE) IMPLANT
NEEDLE 27GAX1X1/2 (NEEDLE) IMPLANT
NEEDLE HYPO 25X1 1.5 SAFETY (NEEDLE) ×2 IMPLANT
NEEDLE HYPO 30GX1 BEV (NEEDLE) IMPLANT
NS IRRIG 1000ML POUR BTL (IV SOLUTION) ×2 IMPLANT
PACK BASIC VI WITH GOWN DISP (CUSTOM PROCEDURE TRAY) ×2 IMPLANT
PACK BASIN DAY SURGERY FS (CUSTOM PROCEDURE TRAY) ×2 IMPLANT
PADDING CAST ABS 3INX4YD NS (CAST SUPPLIES)
PADDING CAST ABS 4INX4YD NS (CAST SUPPLIES)
PADDING CAST ABS COTTON 3X4 (CAST SUPPLIES) IMPLANT
PADDING CAST ABS COTTON 4X4 ST (CAST SUPPLIES) IMPLANT
PENCIL BUTTON HOLSTER BLD 10FT (ELECTRODE) ×2 IMPLANT
SET HNDPC FAN SPRY TIP SCT (DISPOSABLE) IMPLANT
SHEET MEDIUM DRAPE 40X70 STRL (DRAPES) IMPLANT
SLEEVE SCD COMPRESS KNEE MED (MISCELLANEOUS) ×1 IMPLANT
SPLINT PLASTER CAST XFAST 3X15 (CAST SUPPLIES) IMPLANT
SPLINT PLASTER XTRA FASTSET 3X (CAST SUPPLIES)
SPONGE GAUZE 4X4 12PLY (GAUZE/BANDAGES/DRESSINGS) ×3 IMPLANT
SPONGE LAP 18X18 X RAY DECT (DISPOSABLE) ×2 IMPLANT
SPONGE LAP 4X18 X RAY DECT (DISPOSABLE) IMPLANT
STOCKINETTE 4X48 STRL (DRAPES) IMPLANT
STOCKINETTE 6  STRL (DRAPES)
STOCKINETTE 6 STRL (DRAPES) ×1 IMPLANT
STOCKINETTE IMPERVIOUS LG (DRAPES) IMPLANT
STRIP CLOSURE SKIN 1/2X4 (GAUZE/BANDAGES/DRESSINGS) IMPLANT
SUCTION FRAZIER TIP 10 FR DISP (SUCTIONS) IMPLANT
SUT CHROMIC 3 0 PS 2 (SUTURE) IMPLANT
SUT CHROMIC 3 0 SH 27 (SUTURE) ×4 IMPLANT
SUT CHROMIC 4 0 PS 2 18 (SUTURE) ×2 IMPLANT
SUT CHROMIC 5 0 P 3 (SUTURE) ×2 IMPLANT
SUT ETHILON 3 0 PS 1 (SUTURE) IMPLANT
SUT ETHILON 4 0 P 3 18 (SUTURE) IMPLANT
SUT ETHILON 5 0 PS 2 18 (SUTURE) IMPLANT
SUT MON AB 5-0 PS2 18 (SUTURE) ×1 IMPLANT
SUT PROLENE 3 0 PS 2 (SUTURE) IMPLANT
SUT SILK 2 0 (SUTURE)
SUT SILK 2-0 18XBRD TIE 12 (SUTURE) IMPLANT
SUT SILK 3 0 PS 1 (SUTURE) IMPLANT
SUT SILK 4 0 P 3 (SUTURE) ×4 IMPLANT
SUT SILK 4 0 PS 2 (SUTURE) ×15 IMPLANT
SUT VIC AB 3-0 FS2 27 (SUTURE) IMPLANT
SUT VIC AB 5-0 P-3 18X BRD (SUTURE) IMPLANT
SUT VIC AB 5-0 P3 18 (SUTURE)
SUT VIC AB 5-0 PS2 18 (SUTURE) ×4 IMPLANT
SYR BULB 3OZ (MISCELLANEOUS) ×1 IMPLANT
SYR BULB IRRIGATION 50ML (SYRINGE) IMPLANT
SYR CONTROL 10ML LL (SYRINGE) ×2 IMPLANT
TAPE HYPAFIX 6X30 (GAUZE/BANDAGES/DRESSINGS) IMPLANT
TOWEL OR 17X24 6PK STRL BLUE (TOWEL DISPOSABLE) ×2 IMPLANT
TRAY DSU PREP LF (CUSTOM PROCEDURE TRAY) ×1 IMPLANT
TUBE CONNECTING 20X1/4 (TUBING) ×1 IMPLANT
UNDERPAD 30X30 INCONTINENT (UNDERPADS AND DIAPERS) ×2 IMPLANT
WATER STERILE IRR 1000ML POUR (IV SOLUTION) ×1 IMPLANT
WATER STERILE IRR 1500ML POUR (IV SOLUTION) IMPLANT
YANKAUER SUCT BULB TIP NO VENT (SUCTIONS) ×1 IMPLANT

## 2011-09-02 NOTE — Brief Op Note (Signed)
09/02/2011  2:07 PM  PATIENT:  Edward [name]  41 y.o. male  PRE-OPERATIVE DIAGNOSIS:  Redundant foreskin POST-OPERATIVE DIAGNOSIS:  Same  PROCEDURE:  Procedure(s): Free hand circumcision  CIRCUMCISION ADULT  SURGEON:  Surgeon(s): [employer], DO Molli Hazard, MD  PHYSICIAN ASSISTANT:   ASSISTANTS: none   ANESTHESIA:   general  EBL:  Total I/O In: 1000 [I.V.:1000] Out: -   BLOOD ADMINISTERED:none  DRAINS: none   LOCAL MEDICATIONS USED:  MARCAINE 10CC  SPECIMEN:  Source of Specimen:  foreskin  DISPOSITION OF SPECIMEN:  used for skin graft  COUNTS:  YES  TOURNIQUET:  * No tourniquets in log *  DICTATION: .Other Dictation: Dictation Number [id number]  PLAN OF CARE: Discharge to home after PACU  PATIENT DISPOSITION:  remained in OR for skin grafting.   Delay start of Pharmacological VTE agent (>24hrs) due to surgical blood loss or risk of bleeding:  No

## 2011-09-02 NOTE — Anesthesia Procedure Notes (Signed)
Procedure Name: LMA Insertion Performed by: Tawni Millers Pre-anesthesia Checklist: Patient identified, Emergency Drugs available, Suction available, Patient being monitored and Timeout performed Patient Re-evaluated:Patient Re-evaluated prior to inductionOxygen Delivery Method: Circle System Utilized Preoxygenation: Pre-oxygenation with 100% oxygen Intubation Type: IV induction Ventilation: Mask ventilation without difficulty LMA: LMA inserted LMA Size: 5.0 Number of attempts: 1 Placement Confirmation: positive ETCO2 and breath sounds checked- equal and bilateral Tube secured with: Tape Dental Injury: Teeth and Oropharynx as per pre-operative assessment

## 2011-09-02 NOTE — H&P (Signed)
Urology History and Physical Exam  CC: Redundant foreskin  HPI: 41 year old male who required scrotal and perineal debridement for Fournier's Gangrene in late December 2012.  He was appropriately debrided in the OR and at the bedside.  After his infection was controlled, Dr. Migdalia Dk with Plastic Surgery was consulted for wound management.  The patient presents today for wound management in the OR with Dr. Migdalia Dk.  He is uncircumcised.  I discussed with the patient while in the hospital about undergoing circumcision in order to use the foreskin for wound grafting.  We discussed the risks, benefits, alternatives, and likelihood of achieving goals at that time.     PMH: Past Medical History  Diagnosis Date  . Diabetes mellitus   . Hypertension     PSH: Past Surgical History  Procedure Date  . Irrigation and debridement abscess 08/12/2011    Procedure: IRRIGATION AND DEBRIDEMENT ABSCESS;  Surgeon: Molli Hazard, MD;  Location: [place];  Service: Urology;  Laterality: N/A;  irrigation and debridment perineum    . Incision and drainage of wound 08/12/2011    Procedure: IRRIGATION AND DEBRIDEMENT WOUND;  Surgeon: Zenovia Jarred, MD;  Location: [place];  Service: General;;  perineum  . Irrigation and debridement abscess 08/14/2011    Procedure: MINOR INCISION AND DRAINAGE OF ABSCESS;  Surgeon: Molli Hazard, MD;  Location: [place];  Service: Urology;  Laterality: N/A;  Perineal Wound Debridement;Placement Bilateral Testicular Thigh Pouches    Allergies: No Known Allergies  Medications: No prescriptions prior to admission     Social History: History   Social History  . Marital Status: Widowed    Spouse Name: N/A    Number of Children: N/A  . Years of Education: N/A   Occupational History  . Not on file.   Social History Main Topics  . Smoking status: Former Smoker    Quit date: 08/20/2011  . Smokeless tobacco: Never Used  . Alcohol Use: Yes     has not had alcohol  in a month  . Drug Use: No  . Sexually Active:    Other Topics Concern  . Not on file   Social History Narrative  . No narrative on file    Family History: History reviewed. No pertinent family history.  Review of Systems: Positive: Wound drainage Negative: Fever, chills.  A further 10 point review of systems was negative except what is listed in the HPI.  Physical Exam:  General: No acute distress.  Awake. Head:  Normocephalic.  Atraumatic. ENT:  EOMI.  Mucous membranes moist Neck:  Supple.  No lymphadenopathy. CV:  S1 present. S2 present. Regular rate. Pulmonary: Equal effort bilaterally.  Clear to auscultation bilaterally. Abdomen: Soft.  Non-tender to palpation. Skin:  Normal turgor.  No visible rash. Extremity: No gross deformity of bilateral upper extremities.  No gross deformity of    bilateral lower extremities. Neurologic: Alert. Appropriate mood.  Penis:  Uncircumcised. Ventral portion of resection with good granulation tissue. Urethra: No Foley catheter in place.  Orthotopic meatus. Scrotum: Surgically absent.  Good granulation tissue at edges of wound. Testicles: Palpable in thigh pouches bilaterally.  No masses bilaterally.  Studies:  No results found for this basename: HGB:2,WBC:2,PLT:2 in the last 72 hours  No results found for this basename: NA:2,K:2,CL:2,CO2:2,BUN:2,CREATININE:2,CALCIUM:2,MAGNESIUM:2,GFRNONAA:2,GFRAA:2 in the last 72 hours   No results found for this basename: PT:2,INR:2,APTT:2 in the last 72 hours   No components found with this basename: ABG:2    Assessment:  Redundant  foreskin  Plan: -To OR for circumcision.

## 2011-09-02 NOTE — Anesthesia Preprocedure Evaluation (Signed)
Anesthesia Evaluation  Patient identified by MRN, date of birth, ID band Patient awake    Reviewed: Allergy & Precautions, H&P , NPO status , Patient's Chart, lab work & pertinent test results  Airway Mallampati: I TM Distance: >3 FB Neck ROM: Full    Dental No notable dental hx. (+) Teeth Intact   Pulmonary neg pulmonary ROS,  clear to auscultation  Pulmonary exam normal       Cardiovascular hypertension, On Medications Regular Normal    Neuro/Psych Negative Neurological ROS  Negative Psych ROS   GI/Hepatic negative GI ROS, Neg liver ROS,   Endo/Other  Negative Endocrine ROSDiabetes mellitus-, Well Controlled, Type 2, Oral Hypoglycemic AgentsMorbid obesity  Renal/GU negative Renal ROS  Genitourinary negative   Musculoskeletal   Abdominal   Peds  Hematology negative hematology ROS (+)   Anesthesia Other Findings   Reproductive/Obstetrics negative OB ROS                           Anesthesia Physical Anesthesia Plan  ASA: III  Anesthesia Plan: General   Post-op Pain Management:    Induction: Intravenous  Airway Management Planned: LMA  Additional Equipment:   Intra-op Plan:   Post-operative Plan: Extubation in OR  Informed Consent: I have reviewed the patients History and Physical, chart, labs and discussed the procedure including the risks, benefits and alternatives for the proposed anesthesia with the patient or authorized representative who has indicated his/her understanding and acceptance.     Plan Discussed with: CRNA  Anesthesia Plan Comments:         Anesthesia Quick Evaluation

## 2011-09-02 NOTE — Transfer of Care (Signed)
Immediate Anesthesia Transfer of Care Note  Patient: Edward [name]  Procedure(s) Performed:  IRRIGATION AND DEBRIDEMENT EXTREMITY; CIRCUMCISION ADULT  Patient Location: PACU  Anesthesia Type: General  Level of Consciousness: awake, sedated and patient cooperative  Airway & Oxygen Therapy: Patient Spontanous Breathing and Patient connected to face mask oxygen  Post-op Assessment: Report given to PACU RN and Post -op Vital signs reviewed and stable  Post vital signs: Reviewed and stable Filed Vitals:   09/02/11 1218  BP: 128/86  Pulse: 86  Temp: 37.1 C  Resp: 20    Complications: No apparent anesthesia complications

## 2011-09-02 NOTE — Anesthesia Postprocedure Evaluation (Signed)
  Anesthesia Post-op Note  Patient: Edward [name]  Procedure(s) Performed:  IRRIGATION AND DEBRIDEMENT EXTREMITY; CIRCUMCISION ADULT  Patient Location: PACU  Anesthesia Type: General  Level of Consciousness: awake  Airway and Oxygen Therapy: Patient Spontanous Breathing and Patient connected to face mask oxygen  Post-op Pain: mild  Post-op Assessment: Post-op Vital signs reviewed, Patient's Cardiovascular Status Stable, Respiratory Function Stable and Patent Airway  Post-op Vital Signs: Reviewed and stable  Complications: No apparent anesthesia complications

## 2011-09-02 NOTE — Brief Op Note (Signed)
09/02/2011  3:18 PM  PATIENT:  Edward [name]  41 y.o. male  PRE-OPERATIVE DIAGNOSIS:  perineal ulcer  POST-OPERATIVE DIAGNOSIS:  perineal ulcer  PROCEDURE:  Procedure(s): IRRIGATION AND DEBRIDEMENT PERINEAL ULCER WITH FULL THICKNESS SKIN GRAFT AND PRIMARY CLOSURE  CIRCUMCISION ADULT  SURGEON:  Surgeon(s): [employer], DO Molli Hazard, MD  PHYSICIAN ASSISTANT: NONE  ASSISTANTS: none   ANESTHESIA:   local and general  EBL:  Total I/O In: [id number] [I.V.:2600] Out: 600 [Urine:600]  BLOOD ADMINISTERED:none  DRAINS: none   LOCAL MEDICATIONS USED:  MARCAINE 10CC  SPECIMEN:  No Specimen  DISPOSITION OF SPECIMEN:  N/A  COUNTS:  YES  TOURNIQUET:  * No tourniquets in log *  DICTATION: dictated  PLAN OF CARE: Discharge to home after PACU  PATIENT DISPOSITION:  PACU - hemodynamically stable.   Delay start of Pharmacological VTE agent (>24hrs) due to surgical blood loss or risk of bleeding:  NO

## 2011-09-02 NOTE — H&P (Addendum)
Edward [name] is an 41 y.o. male.   Chief Complaint: perineal ulcer HPI: Edward [name] is a 41 yrs old bm who had fournier's gangrene and was treated with irrigation and debridement.  He presents for closure of the ulceration.  Past Medical History  Diagnosis Date  . Diabetes mellitus   . Hypertension     Past Surgical History  Procedure Date  . Irrigation and debridement abscess 08/12/2011    Procedure: IRRIGATION AND DEBRIDEMENT ABSCESS;  Surgeon: Molli Hazard, MD;  Location: [place];  Service: Urology;  Laterality: N/A;  irrigation and debridment perineum    . Incision and drainage of wound 08/12/2011    Procedure: IRRIGATION AND DEBRIDEMENT WOUND;  Surgeon: Zenovia Jarred, MD;  Location: [place];  Service: General;;  perineum  . Irrigation and debridement abscess 08/14/2011    Procedure: MINOR INCISION AND DRAINAGE OF ABSCESS;  Surgeon: Molli Hazard, MD;  Location: [place];  Service: Urology;  Laterality: N/A;  Perineal Wound Debridement;Placement Bilateral Testicular Thigh Pouches    History reviewed. No pertinent family history. Social History:  reports that he quit smoking about 1 weeks ago. He has never used smokeless tobacco. He reports that he drinks alcohol. He reports that he does not use illicit drugs.  Allergies: No Known Allergies  Medications Prior to Admission  Medication Dose Route Frequency Provider Last Rate Last Dose  . ceFAZolin (ANCEF) IVPB 1 g/50 mL premix  1 g Intravenous 60 min Pre-Op Emanuella Nickle Sanger, DO      . chlorhexidine (HIBICLENS) 4 % liquid 1 application  1 application Topical Once [employer], DO      . lactated ringers infusion   Intravenous Continuous Scot Dock, MD      . DISCONTD: 0.9 %  sodium chloride infusion   Intravenous Continuous [name], MD 10 mL/hr at 08/19/11 1037 10 mL/hr at 08/19/11 1037  . DISCONTD: acetaminophen (TYLENOL) solution 650 mg  650 mg Per Tube Q6H PRN Jennet Maduro      . DISCONTD:  cefTRIAXone (ROCEPHIN) 2 g in dextrose 5 % 50 mL IVPB  2 g Intravenous Q24H [name], MD   2 g at 08/20/11 1004  . DISCONTD: clindamycin (CLEOCIN) IVPB 900 mg  900 mg Intravenous Q8H Molli Hazard, MD   900 mg at 08/20/11 [phone]  . DISCONTD: collagenase (SANTYL) ointment 1 application  1 application Topical TID [name], MD   1 application at [phone]  . DISCONTD: feeding supplement (PRO-STAT SUGAR FREE 64) liquid 30 mL  30 mL Oral TID WC Juleen Starr Lamberton, RD   30 mL at 08/20/11 1002  . DISCONTD: fentaNYL (SUBLIMAZE) injection 25-50 mcg  25-50 mcg Intravenous Q2H PRN [name], MD   50 mcg at 08/20/11 1234  . DISCONTD: insulin aspart (novoLOG) injection 0-15 Units  0-15 Units Subcutaneous TID WC [name], MD   2 Units at 08/20/11 1003  . DISCONTD: insulin aspart (novoLOG) injection 0-5 Units  0-5 Units Subcutaneous QHS [name], MD   2 Units at 08/17/11 2330  . DISCONTD: insulin aspart (novoLOG) injection 1-4 Units  1-4 Units Subcutaneous Q4H PRN Jennet Maduro      . DISCONTD: lisinopril (PRINIVIL,ZESTRIL) tablet 20 mg  20 mg Oral Daily [name], MD   20 mg at 08/20/11 1016  . DISCONTD: metFORMIN (GLUCOPHAGE) tablet 850 mg  850 mg Oral BID WC [name], MD   850 mg at 08/20/11  1004  . DISCONTD: pantoprazole (PROTONIX) EC tablet 40 mg  40 mg Oral Q1200 [name], MD   40 mg at 08/20/11 1234  . DISCONTD: promethazine (PHENERGAN) injection 12.5 mg  12.5 mg Intravenous Q6H PRN Verneita Griffes, MD       Medications Prior to Admission  Medication Sig Dispense Refill  . collagenase (SANTYL) ointment Apply to perineum  15 g  2  . ibuprofen (ADVIL,MOTRIN) 200 MG tablet Take 600 mg by mouth every 6 (six) hours as needed. For pain      . lisinopril (PRINIVIL,ZESTRIL) 20 MG tablet Take 20 mg by mouth daily.       . metFORMIN (GLUCOPHAGE) 850 MG tablet Take 850 mg by mouth 2 (two) times daily with a meal. Patient is poor historian related  to medication      . oxyCODONE-acetaminophen (PERCOCET) 7.5-325 MG per tablet Take 1 tablet by mouth every 4 (four) hours as needed for pain.  30 tablet  0    Results for orders placed during the hospital encounter of 09/02/11 (from the past 48 hour(s))  POCT I-STAT, CHEM 8     Status: Abnormal   Collection Time   09/02/11 12:09 PM      Component Value Range Comment   Sodium 139  135 - 145 (mEq/L)    Potassium 4.0  3.5 - 5.1 (mEq/L)    Chloride 105  96 - 112 (mEq/L)    BUN 18  6 - 23 (mg/dL)    Creatinine, Ser 1.00  0.50 - 1.35 (mg/dL)    Glucose, Bld 119 (*) 70 - 99 (mg/dL)    Calcium, Ion 1.17  1.12 - 1.32 (mmol/L)    TCO2 23  0 - 100 (mmol/L)    Hemoglobin 14.3  13.0 - 17.0 (g/dL)    HCT 42.0  39.0 - 52.0 (%)    No results found.  Review of Systems  Constitutional: Negative.   HENT: Negative.   Eyes: Negative.   Respiratory: Negative.   Cardiovascular: Negative.   Gastrointestinal: Negative.   Genitourinary: Negative.   Musculoskeletal: Negative.   Skin: Negative.   Neurological: Negative.   Endo/Heme/Allergies: Negative.   Psychiatric/Behavioral: Negative.     Blood pressure 128/86, pulse 86, temperature 98.8 F (37.1 C), temperature source Oral, resp. rate 20, SpO2 100.00%. Physical Exam  Constitutional: He is oriented to person, place, and time. He appears well-developed and well-nourished.  HENT:  Head: Normocephalic and atraumatic.  Eyes: Conjunctivae and EOM are normal. Pupils are equal, round, and reactive to light.  Neck: Normal range of motion.  Cardiovascular: Normal rate.   Respiratory: Effort normal. No respiratory distress. He has no wheezes.  GI: He exhibits no distension. There is no tenderness.  Musculoskeletal: Normal range of motion.  Neurological: He is alert and oriented to person, place, and time.  Skin: Skin is warm.  Psychiatric: He has a normal mood and affect. His behavior is normal.   Ulceration of the perineal area with partial  granulation.  Assessment/Plan Perineal ulcer with plan for closure of ulceration and possible split/full thickness skin graft to the area with VAC placement.  Risks and complications were reviewed including pain, risk of infection, risk of anesthesia, scar, recurrence, contracture,and infection. The patient was marked and consent signed.    SANGER,Timur Nibert 09/02/2011, 12:23 PM

## 2011-09-03 ENCOUNTER — Encounter (HOSPITAL_BASED_OUTPATIENT_CLINIC_OR_DEPARTMENT_OTHER): Payer: Self-pay | Admitting: Plastic Surgery

## 2011-09-03 NOTE — Op Note (Signed)
NAME:  JLYN, GAY NO.:  [id number]  MEDICAL RECORD NO.:  [id number]  LOCATION:  [facility]                        FACILITY:  [facility]  PHYSICIAN:  Theodoro Kos, DO      DATE OF BIRTH:  Jul 02, 1971  DATE OF PROCEDURE:  09/02/2011 DATE OF DISCHARGE:                              OPERATIVE REPORT   PREOPERATIVE DIAGNOSIS:  Perineal ulcer.  POSTOPERATIVE DIAGNOSIS:  Perineal ulcer.  PROCEDURE:  Irrigation and debridement perineal ulcers, skin, subcutaneous tissue, and placement of a full-thickness skin graft, 2 x 4 cm, primary closure of 6 cm wound with 1 cm undermining.  ATTENDING:  Theodoro Kos, DO  ANESTHESIA:  General.  INDICATION FOR PROCEDURE:  The patient is a 41 year old gentleman who underwent excision and debridement in the past for Fournier gangrene, and has been doing dressing changes at home.  He has been doing extremely well, the infection has cleared, and he presents for closure.  DESCRIPTION OF PROCEDURE:  The patient was seen in holding.  Risks and complications were reviewed including bleeding, pain, scar, risk of anesthesia, recurrence and scar contracture.  Consent was signed.  The patient was marked.  The patient was taken to the operating room, placed on the operating room table in supine position.  General anesthesia was administered.  Once adequate, a time-out was called and all information was confirmed to be correct.  The patient then underwent a circumcision by Urology.  When they were finished with their portion of the case, the patient was rendered to the Plastic Surgery Service.  The wound was irrigated with copious amounts of normal saline and antibiotic solution. Once complete, the edges of the entire area which was the penis and scrotal area was debrided as well as the central portion of the subcutaneous tissue.  A 1 cm undermining was done in the lateral direction of the most proximal portion of the wound in order to help with  closure.  The deep layers were closed with 4-0 chromic and a running 5-0 Monocryl was used to close the skin with several interrupted sutures for security.  The skin was thinned from what was given to [place] from the circumcision and it was then tacked in place more distally with 5-0 Vicryl.  Episil was then sprayed under the graft and under the proximal portion of the closure and Xeroform cotton bolster was applied with 4-0 silk and tied in place. Dermabond was placed on the portion of the wound that was closed primarily.  The patient tolerated the procedure well.  There were no complications.  He was awoken and taken to recovery room in stable condition.     Theodoro Kos, DO     CS/MEDQ  D:  09/02/2011  T:  09/03/2011  Job:  [id number]

## 2011-09-03 NOTE — Op Note (Signed)
NAME:  Edward [name], Edward [name] NO.:  [id number]  MEDICAL RECORD NO.:  [id number]  LOCATION:  [facility]                        FACILITY:  [facility]  PHYSICIAN:  Rolan Bucco, MD    DATE OF BIRTH:  05/21/1971  DATE OF PROCEDURE:  09/02/2011 DATE OF DISCHARGE:                              OPERATIVE REPORT   PREOPERATIVE DIAGNOSIS:  Redundant foreskin.  POSTOPERATIVE DIAGNOSIS:  Redundant foreskin.  PROCEDURE PERFORMED:  Freehand circumcision.  SURGEON:  Rolan Bucco, MD  ASSISTANT:  Lyndee Leo Sanger, DO  COMPLICATIONS:  None.  ESTIMATED BLOOD LOSS:  Less than 5 mL.  FINDINGS:  Granulation tissue of a previous surgical wound.  DRAINS:  None.  COMPLICATIONS:  None.  SPECIMEN:  Foreskin used by Dr. Migdalia Dk for skin graft.  HISTORY OF PRESENT ILLNESS:  This is a 41 year old gentleman who presented with Fournier gangrene.  After removal of his scrotum and placement of his testicles on thigh pouches, he was left with a large perineal wound.  After debridement has been adequate, this was managed by Dr. Migdalia Dk for evaluation of closure and healing.  The patient presents today for elective closure of this wound.  In discussion of this wound, I did advise the patient regarding circumcision as a possible source of a skin graft.  He presents today for that combined procedure.  PROCEDURE:  Informed consent was obtained.  The patient was taken to the operating room where he placed in supine position.  IV antibiotics were infused, and general anesthesia was induced.  Time-out was performed in which the correct patient, surgical site, and procedure were identified and agreed upon by the team.  He has been placed in a frog-leg position, and his legs were secured and padded and supported in an appropriate fashion.  His genitals, lower abdomen, thighs and perineum were all prepped and draped in usual fashion.  Following this, a penile block was performed with 0.25% Marcaine  injecting 10 mL.  After this was done, the foreskin was reduced and retracted and surgical marker was used to mark the portions that were to be removed.  After this, incision was made at the proximal portion of the foreskin where it was marked with a 15-blade scalpel.  Then, the foreskin was retracted and incision was made on the prepuce circumferentially.  After this was done, Metzenbaum scissors and Bovie electrocautery were used to remove the foreskin circumferentially. It is noted that a 16-French Foley catheter was placed prior to beginning any dissection on the ventral surface of the penis to avoid injury to the urethra.  After this was done, good hemostasis was maintained with Bovie electrocautery and then the edges were reapproximated with interrupted 3-0 chromic sutures that were buried underneath the skin.  Following this, Dermabond was placed around the edges of the skin.  This completed the procedure.  The remaining skin that was removed was left with Dr. Migdalia Dk to perform a skin graft.  The patient will follow up with me in approximately 2 weeks for wound check.  DISPOSITION:  He remained in the operating room under the supervision of Dr. Migdalia Dk.          ______________________________ Rolan Bucco, MD  DW/MEDQ  D:  09/02/2011  T:  09/03/2011  Job:  [id number]

## 2011-09-06 ENCOUNTER — Encounter (HOSPITAL_BASED_OUTPATIENT_CLINIC_OR_DEPARTMENT_OTHER): Payer: Self-pay

## 2013-04-27 ENCOUNTER — Ambulatory Visit (HOSPITAL_COMMUNITY)
Admit: 2013-04-27 | Discharge: 2013-04-27 | Disposition: A | Discharge status: Home / Self Care | Payer: 59 | Attending: Emergency Medicine | Admitting: Emergency Medicine

## 2013-04-27 ENCOUNTER — Emergency Department (HOSPITAL_COMMUNITY)
Admission: EM | Admit: 2013-04-27 | Discharge: 2013-04-27 | Disposition: A | Discharge status: Home / Self Care | Payer: 59 | Source: Home / Self Care | Attending: Emergency Medicine | Admitting: Emergency Medicine

## 2013-04-27 ENCOUNTER — Encounter (HOSPITAL_COMMUNITY): Payer: Self-pay | Admitting: Emergency Medicine

## 2013-04-27 DIAGNOSIS — M79609 Pain in unspecified limb: Secondary | ICD-10-CM

## 2013-04-27 DIAGNOSIS — M25569 Pain in unspecified knee: Secondary | ICD-10-CM

## 2013-04-27 DIAGNOSIS — R252 Cramp and spasm: Secondary | ICD-10-CM | POA: Insufficient documentation

## 2013-04-27 DIAGNOSIS — M25562 Pain in left knee: Secondary | ICD-10-CM

## 2013-04-27 MED ORDER — METHOCARBAMOL 500 MG PO TABS: 500.0000 mg | ORAL_TABLET | Freq: Three times a day (TID) | ORAL | Status: DC

## 2013-04-27 MED ORDER — MELOXICAM 15 MG PO TABS: 15.0000 mg | ORAL_TABLET | Freq: Every day | ORAL | Status: DC

## 2013-04-27 MED ORDER — KETOROLAC TROMETHAMINE 60 MG/2ML IM SOLN
INTRAMUSCULAR | Status: AC
  Filled 2013-04-27: qty 2

## 2013-04-27 MED ORDER — KETOROLAC TROMETHAMINE 60 MG/2ML IM SOLN
60.0000 mg | Freq: Once | INTRAMUSCULAR | Status: AC
  Administered 2013-04-27: 60 mg via INTRAMUSCULAR

## 2013-04-27 NOTE — ED Notes (Signed)
Pt c/o left knee pain onset this am Reports he has always had intermittent pain on knee... Had had neg x-rays Sxs today include: stiffness, swelling of knee Denies: inj/trauma... Reports he's always lifting heavy objects at work  Also c/o rash on left arm that's gradually getting better Alert w/no signs of acute distress.

## 2013-04-27 NOTE — ED Notes (Signed)
Cardio lab called w/neg report... Also pt is ready to be p/u Notified Dr. Jake Michaelis of results.

## 2013-04-27 NOTE — Progress Notes (Signed)
*  PRELIMINARY RESULTS* Vascular Ultrasound Left lower extremity venous duplex has been completed.  Preliminary findings: negative for DVT.  Called report to Dr. Viann Shove office, [phone].   Landry Mellow, RDMS, RVT  04/27/2013, 12:35 PM

## 2013-04-27 NOTE — ED Provider Notes (Signed)
Chief Complaint:   Chief Complaint  Patient presents with  . Knee Pain    History of Present Illness:   Edward [name] is a-year-old male who has had a a five-year history of left knee pain off-and-on. He has seen his primary care physician for this and had several x-rays. No definite diagnosis has been made. He has to walk with a limp. This morning the pain got worse. He denies any injury. He describes a crampy sensation that radiated from the knee into the hip, buttock, and lower back. The knee is always swollen he is not able to tell that is any worse than usual. He has had some calf pain and has varicose veins on that side. He describes pain with weightbearing and movement of the knee. There is no giving way of the knee. He's had no chest pain or shortness of breath. No history of DVT or thrombophlebitis.  Review of Systems:  Other than noted above, the patient denies any of the following symptoms: Systemic:  No fevers, chills, sweats, or aches.  No fatigue or tiredness. Musculoskeletal:  No joint pain, arthritis, bursitis, swelling, back pain, or neck pain.  Neurological:  No muscular weakness, paresthesias, headache, or trouble with speech or coordination.  No dizziness.  [place]:  Past medical history, family history, social history, meds, and allergies were reviewed.  No prior history of knee pain or arthritis.  He has diabetes and has a history of Fournier's gangrene. He takes metformin.  Physical Exam:   Vital signs:  BP 151/100  Pulse 69  Temp(Src) 98.1 F (36.7 C) (Oral)  Resp 16  SpO2 99% Gen:  Alert and oriented times 3.  In no distress. Musculoskeletal: Exam of the knee reveals generalized pain to palpation. There was no effusion or fluid present. No erythema or heat. He has some pain to palpation in the quadriceps muscle, the popliteal fossa, but no pain in the calf and Homans sign was negative. He does have varicose veins and there is no palpable cord.   McMurray's test was  negative.  Lachman's test was negative.  Anterior drawer test was negative.   Varus and valgus stress negative.  Otherwise, all joints had a full a ROM with no swelling, bruising or deformity.  No edema, pulses full. Extremities were warm and pink.  Capillary refill was brisk.  Skin:  Clear, warm and dry.  No rash. Neuro:  Alert and oriented times 3.  Muscle strength was normal.  Sensation was intact to light touch.   Radiology:  A duplex venous Doppler was negative for DVT.  Course in Urgent Care Center:   Given Toradol 60 mg IM and a knee sleeve was placed.  Assessment:  The encounter diagnosis was Knee pain, left.  Suspect she probably has chronic osteoarthritis and something caused it to flareup.  Plan:   1.  Meds:  The following meds were prescribed:   Discharge Medication List as of 04/27/2013 12:30 PM    START taking these medications   Details  meloxicam (MOBIC) 15 MG tablet Take 1 tablet (15 mg total) by mouth daily., Starting 04/27/2013, Until Discontinued, Normal    methocarbamol (ROBAXIN) 500 MG tablet Take 1 tablet (500 mg total) by mouth 3 (three) times daily., Starting 04/27/2013, Until Discontinued, Normal        2.  Patient Education/Counseling:  The patient was given appropriate handouts, self care instructions, and instructed in symptomatic relief, including rest and activity, elevation, application of ice and compression.  Was advised to rest and stay off the foot.  3.  Follow up:  The patient was told to follow up if no better in 3 to 4 days, if becoming worse in any way, and given some red flag symptoms such as increasing pain or fever which would prompt immediate return.  Follow up with Dr. Altamese [place] within the next week.     [name], MD 04/27/13 [phone]

## 2013-04-27 NOTE — ED Notes (Signed)
Spoke w/Camilla at [facility]... Patient transported to Cardiovascular lab

## 2013-11-03 ENCOUNTER — Emergency Department (INDEPENDENT_AMBULATORY_CARE_PROVIDER_SITE_OTHER)
Admission: EM | Admit: 2013-11-03 | Discharge: 2013-11-03 | Disposition: A | Discharge status: Home / Self Care | Payer: 59 | Source: Home / Self Care | Attending: Emergency Medicine | Admitting: Emergency Medicine

## 2013-11-03 ENCOUNTER — Encounter (HOSPITAL_COMMUNITY): Payer: Self-pay | Admitting: Emergency Medicine

## 2013-11-03 DIAGNOSIS — E119 Type 2 diabetes mellitus without complications: Secondary | ICD-10-CM

## 2013-11-03 LAB — POCT I-STAT, CHEM 8
BUN: 28 mg/dL — ABNORMAL HIGH (ref 6–23)
CHLORIDE: 106 meq/L (ref 96–112)
Calcium, Ion: 1.12 mmol/L (ref 1.12–1.23)
Creatinine, Ser: 1.4 mg/dL — ABNORMAL HIGH (ref 0.50–1.35)
Glucose, Bld: 319 mg/dL — ABNORMAL HIGH (ref 70–99)
HEMATOCRIT: 47 % (ref 39.0–52.0)
Hemoglobin: 16 g/dL (ref 13.0–17.0)
POTASSIUM: 4.2 meq/L (ref 3.7–5.3)
Sodium: 138 mEq/L (ref 137–147)
TCO2: 19 mmol/L (ref 0–100)

## 2013-11-03 MED ORDER — SITAGLIPTIN PHOS-METFORMIN HCL 50-1000 MG PO TABS: 1.0000 | ORAL_TABLET | Freq: Two times a day (BID) | ORAL | Status: DC

## 2013-11-03 MED ORDER — GLIMEPIRIDE 2 MG PO TABS: 2.0000 mg | ORAL_TABLET | Freq: Every day | ORAL | Status: DC

## 2013-11-03 MED ORDER — GLUCOSE BLOOD VI STRP: ORAL_STRIP | Status: DC

## 2013-11-03 NOTE — ED Provider Notes (Signed)
Chief Complaint   Chief Complaint  Patient presents with  . Hyperglycemia    History of Present Illness   Edward [name] is a 43 year old male who has had diabetes since 2004. He's been on insulin and oral hypoglycemics. Week ago he had a job physical at which his blood sugar was found to be over 400. He has had no symptoms such as polyuria, polydipsia, blurry vision, or weight change. He's been on Janumet 50/1000 one twice a day. He has a primary care doctor but could not get in to see them in time. He needs something stronger for his diabetes. At home his blood sugars are running between 96 and 291. He denies any vision changes, shortness of breath, chest discomfort, GI symptoms, extremity pain, paresthesias, ulcerations, or swelling. No hypoglycemic episodes.  Review of Systems   Other than as noted above, the patient denies any of the following symptoms. Systemic:  No fever, chills, weight loss or gain. Eye:  No blurred vision or diplopia. Lungs:  No cough or shortness of breath. Heart:  No chest pain, tightness, pressure, palpitation, dizziness, syncope, or edema. Abdomen:  No abdominal pain, nausea, vomiting or diarrrhea. Ext:  No pain, paresthesias, swelling, or ulcerations. Endocrine:  No polyuria or polydipsia. Skin:  No rash or itching. Neuro:  No focal weakness or numbness.  [place]   Past medical history, family history, social history, meds, and allergies were reviewed. He smokes less than one pack of cigarettes per day and drinks occasionally.  Physical Examination    Vital signs:  BP 142/85  Pulse 81  Temp(Src) 97.9 F (36.6 C) (Oral)  Resp 16  SpO2 100% Gen:  Alert, oriented, in no distress. Eye:  PERRL, full EOM, lids conjunctivas, and sclera unremarkable. ENT:  TMs and canals normal.  Mucous membranes moist.  No acetone odor.  Pharynx clear.   Neck:  Supple, full ROM, no adenopathy or tenderness.  No JVD. Lungs:  Clear to auscultation.  No wheezes, rales or  rhonchi. Heart:  Regular rhythm.  No gallops or murmers. Abdomen:  Soft, flat, non-distended, nontener.  No hepato-splenomegaly or mass.  Bowel sounds normal.  No pulsatile midline mass or bruit. Ext:  No edema, pulses full.  No ulceration or skin lesions. Skin:  Clear, warm and dry.  No rash or lesions. Neuro:  Alert and oriented times 3.  No focal weakness.  Speech normal.  CNs intact.  Labs   Results for orders placed during the hospital encounter of 11/03/13  POCT I-STAT, CHEM 8      Result Value Ref Range   Sodium 138  137 - 147 mEq/L   Potassium 4.2  3.7 - 5.3 mEq/L   Chloride 106  96 - 112 mEq/L   BUN 28 (*) 6 - 23 mg/dL   Creatinine, Ser 1.40 (*) 0.50 - 1.35 mg/dL   Glucose, Bld 319 (*) 70 - 99 mg/dL   Calcium, Ion 1.12  1.12 - 1.23 mmol/L   TCO2 19  0 - 100 mmol/L   Hemoglobin 16.0  13.0 - 17.0 g/dL   HCT 47.0  39.0 - 52.0 %    Assessment   The encounter diagnosis was Diabetes mellitus.  Diabetes, not well controlled. Will add glimepiride 2 mg per day. He can return to work and should followup with his primary care physician within the next month. He was provided with a prescription for an Accu-Chek monitor and strips.  Plan     1.  Meds:  The  following meds were prescribed:   New Prescriptions   GLIMEPIRIDE (AMARYL) 2 MG TABLET    Take 1 tablet (2 mg total) by mouth daily with breakfast.   GLUCOSE BLOOD TEST STRIP    Use as instructed   SITAGLIPTIN-METFORMIN (JANUMET) 50-1000 MG PER TABLET    Take 1 tablet by mouth 2 (two) times daily with a meal.    2.  Patient Education/Counseling:  The patient was given appropriate handouts, self care instructions, and instructed in symptomatic relief.  Discussed diet, exercise, and weight loss.  He was encouraged to quit smoking.  3.  Follow up:  The patient was told to follow up here if no better in 3 to 4 days, or sooner if becoming worse in any way, and given some red flag symptoms such as fever, persistent vomiting, abdominal  pain, chest pain, or shortness of breath which would prompt immediate return.  Follow up with Dr. Berdine Addison within the next month.      [name], MD 11/03/13 2040

## 2013-11-03 NOTE — ED Notes (Signed)
States he was told in a pre employment exam, that his sugar was too high, and he might need to have his medication changed. His regular MD is booked up, and cannot get in to see him for a while

## 2013-11-03 NOTE — Discharge Instructions (Signed)
Type 2 Diabetes Mellitus, Adult Type 2 diabetes mellitus, often simply referred to as type 2 diabetes, is a long-lasting (chronic) disease. In type 2 diabetes, the pancreas does not make enough insulin (a hormone), the cells are less responsive to the insulin that is made (insulin resistance), or both. Normally, insulin moves sugars from food into the tissue cells. The tissue cells use the sugars for energy. The lack of insulin or the lack of normal response to insulin causes excess sugars to build up in the blood instead of going into the tissue cells. As a result, high blood sugar (hyperglycemia) develops. The effect of high sugar (glucose) levels can cause many complications. Type 2 diabetes was also previously called adult-onset diabetes but it can occur at any age.  RISK FACTORS  A person is predisposed to developing type 2 diabetes if someone in the family has the disease and also has one or more of the following primary risk factors:  Overweight.  An inactive lifestyle.  A history of consistently eating high-calorie foods. Maintaining a normal weight and regular physical activity can reduce the chance of developing type 2 diabetes. SYMPTOMS  A person with type 2 diabetes may not show symptoms initially. The symptoms of type 2 diabetes appear slowly. The symptoms include:  Increased thirst (polydipsia).  Increased urination (polyuria).  Increased urination during the night (nocturia).  Weight loss. This weight loss may be rapid.  Frequent, recurring infections.  Tiredness (fatigue).  Weakness.  Vision changes, such as blurred vision.  Fruity smell to your breath.  Abdominal pain.  Nausea or vomiting.  Cuts or bruises which are slow to heal.  Tingling or numbness in the hands or feet. DIAGNOSIS Type 2 diabetes is frequently not diagnosed until complications of diabetes are present. Type 2 diabetes is diagnosed when symptoms or complications are present and when blood  glucose levels are increased. Your blood glucose level may be checked by one or more of the following blood tests:  A fasting blood glucose test. You will not be allowed to eat for at least 8 hours before a blood sample is taken.  A random blood glucose test. Your blood glucose is checked at any time of the day regardless of when you ate.  A hemoglobin A1c blood glucose test. A hemoglobin A1c test provides information about blood glucose control over the previous 3 months.  An oral glucose tolerance test (OGTT). Your blood glucose is measured after you have not eaten (fasted) for 2 hours and then after you drink a glucose-containing beverage. TREATMENT   You may need to take insulin or diabetes medicine daily to keep blood glucose levels in the desired range.  You will need to match insulin dosing with exercise and healthy food choices. The treatment goal is to maintain the before meal blood sugar (preprandial glucose) level at 70 130 mg/dL. HOME CARE INSTRUCTIONS   Have your hemoglobin A1c level checked twice a year.  Perform daily blood glucose monitoring as directed by your caregiver.  Monitor urine ketones when you are ill and as directed by your caregiver.  Take your diabetes medicine or insulin as directed by your caregiver to maintain your blood glucose levels in the desired range.  Never run out of diabetes medicine or insulin. It is needed every day.  Adjust insulin based on your intake of carbohydrates. Carbohydrates can raise blood glucose levels but need to be included in your diet. Carbohydrates provide vitamins, minerals, and fiber which are an essential part of   a healthy diet. Carbohydrates are found in fruits, vegetables, whole grains, dairy products, legumes, and foods containing added sugars.    Eat healthy foods. Alternate 3 meals with 3 snacks.  Lose weight if overweight.  Carry a medical alert card or wear your medical alert jewelry.  Carry a 15 gram  carbohydrate snack with you at all times to treat low blood glucose (hypoglycemia). Some examples of 15 gram carbohydrate snacks include:  Glucose tablets, 3 or 4   Glucose gel, 15 gram tube  Raisins, 2 tablespoons (24 grams)  Jelly beans, 6  Animal crackers, 8  Regular pop, 4 ounces (120 mL)  Gummy treats, 9  Recognize hypoglycemia. Hypoglycemia occurs with blood glucose levels of 70 mg/dL and below. The risk for hypoglycemia increases when fasting or skipping meals, during or after intense exercise, and during sleep. Hypoglycemia symptoms can include:  Tremors or shakes.  Decreased ability to concentrate.  Sweating.  Increased heart rate.  Headache.  Dry mouth.  Hunger.  Irritability.  Anxiety.  Restless sleep.  Altered speech or coordination.  Confusion.  Treat hypoglycemia promptly. If you are alert and able to safely swallow, follow the 15:15 rule:  Take 15 20 grams of rapid-acting glucose or carbohydrate. Rapid-acting options include glucose gel, glucose tablets, or 4 ounces (120 mL) of fruit juice, regular soda, or low fat milk.  Check your blood glucose level 15 minutes after taking the glucose.  Take 15 20 grams more of glucose if the repeat blood glucose level is still 70 mg/dL or below.  Eat a meal or snack within 1 hour once blood glucose levels return to normal.    Be alert to polyuria and polydipsia which are early signs of hyperglycemia. An early awareness of hyperglycemia allows for prompt treatment. Treat hyperglycemia as directed by your caregiver.  Engage in at least 150 minutes of moderate-intensity physical activity a week, spread over at least 3 days of the week or as directed by your caregiver. In addition, you should engage in resistance exercise at least 2 times a week or as directed by your caregiver.  Adjust your medicine and food intake as needed if you start a new exercise or sport.  Follow your sick day plan at any time you  are unable to eat or drink as usual.  Avoid tobacco use.  Limit alcohol intake to no more than 1 drink per day for nonpregnant women and 2 drinks per day for men. You should drink alcohol only when you are also eating food. Talk with your caregiver whether alcohol is safe for you. Tell your caregiver if you drink alcohol several times a week.  Follow up with your caregiver regularly.  Schedule an eye exam soon after the diagnosis of type 2 diabetes and then annually.  Perform daily skin and foot care. Examine your skin and feet daily for cuts, bruises, redness, nail problems, bleeding, blisters, or sores. A foot exam by a caregiver should be done annually.  Brush your teeth and gums at least twice a day and floss at least once a day. Follow up with your dentist regularly.  Share your diabetes management plan with your workplace or school.  Stay up-to-date with immunizations.  Learn to manage stress.  Obtain ongoing diabetes education and support as needed.  Participate in, or seek rehabilitation as needed to maintain or improve independence and quality of life. Request a physical or occupational therapy referral if you are having foot or hand numbness or difficulties with grooming,   dressing, eating, or physical activity. SEEK MEDICAL CARE IF:   You are unable to eat food or drink fluids for more than 6 hours.  You have nausea and vomiting for more than 6 hours.  Your blood glucose level is over 240 mg/dL.  There is a change in mental status.  You develop an additional serious illness.  You have diarrhea for more than 6 hours.  You have been sick or have had a fever for a couple of days and are not getting better.  You have pain during any physical activity.  SEEK IMMEDIATE MEDICAL CARE IF:  You have difficulty breathing.  You have moderate to large ketone levels. MAKE SURE YOU:  Understand these instructions.  Will watch your condition.  Will get help right away if  you are not doing well or get worse. Document Released: 08/05/2005 Document Revised: 04/29/2012 Document Reviewed: 03/03/2012 [employer] Patient Information 2014 [employer].  

## 2014-02-09 ENCOUNTER — Emergency Department (HOSPITAL_COMMUNITY)
Admission: EM | Admit: 2014-02-09 | Discharge: 2014-02-09 | Disposition: A | Discharge status: Home / Self Care | Payer: 59 | Source: Home / Self Care | Attending: Emergency Medicine | Admitting: Emergency Medicine

## 2014-02-09 ENCOUNTER — Encounter (HOSPITAL_COMMUNITY): Payer: Self-pay | Admitting: Emergency Medicine

## 2014-02-09 DIAGNOSIS — J019 Acute sinusitis, unspecified: Secondary | ICD-10-CM

## 2014-02-09 MED ORDER — AMOXICILLIN-POT CLAVULANATE 875-125 MG PO TABS: 1.0000 | ORAL_TABLET | Freq: Two times a day (BID) | ORAL | Status: DC

## 2014-02-09 NOTE — Discharge Instructions (Signed)
Most upper respiratory infections are caused by viruses and do not require antibiotics.  We try to save the antibiotics for when we really need them to prevent bacteria from developing resistance to them.  Here are a few hints about things that can be done at home to help get over an upper respiratory infection quicker: ° °Get extra sleep and extra fluids.  Get 7 to 9 hours of sleep per night and 6 to 8 glasses of water a day.  Getting extra sleep keeps the immune system from getting run down.  Most people with an upper respiratory infection are a little dehydrated.  The extra fluids also keep the secretions liquified and easier to deal with.  Also, get extra vitamin C.  4000 mg per day is the recommended dose. °For the aches, headache, and fever, acetaminophen or ibuprofen are helpful.  These can be alternated every 4 hours.  People with liver disease should avoid large amounts of acetaminophen, and people with ulcer disease, gastroesophageal reflux, gastritis, congestive heart failure, chronic kidney disease, coronary artery disease and the elderly should avoid ibuprofen. °For nasal congestion try Mucinex-D, or if you're having lots of sneezing or clear nasal drainage use Zyrtec-D. People with high blood pressure can take these if their blood pressure is controlled, if not, it's best to avoid the forms with a "D" (decongestants).  You can use the plain Mucinex, Allegra, Claritin, or Zyrtec even if your blood pressure is not controlled.   °A Saline nasal spray such as Ocean Spray can also help.  You can add a decongestant sprays such as Afrin, but you should not use the decongestant sprays for more than 3 or 4 days since they can be habituating.  Breathe Rite nasal strips can also offer a non-drug alternative treatment to nasal congestion, especially at night. °For people with symptoms of sinusitis, sleeping with your head elevated can be helpful.  For sinus pain, moist, hot compresses to the face may provide some  relief.  Many people find that inhaling steam as in a shower or from a pot of steaming water can help. °For any viral infection, zinc containing lozenges such as Cold-Eze or Zicam are helpful.  Zinc helps to fight viral infection.  Hot salt water gargles (8 oz of hot water, 1/2 tsp of table salt, and a pinch of baking soda) can give relief as well as hot beverages such as hot tea.  Sucrets extra strength lozenges will help the sore throat.  °For the cough, take Delsym 2 tsp every 12 hours.  It has also been found recently that Aleve can help control a cough.  The dose is 1 to 2 tablets twice daily with food.  This can be combined with Delsym. (Note, if you are taking ibuprofen, you should not take Aleve as well--take one or the other.) °A cool mist vaporizer will help keep your mucous membranes from drying out.  ° °It's important when you have an upper respiratory infection not to pass the infection to others.  This involves being very careful about the following: ° °Frequent hand washing or use of hand sanitizer, especially after coughing, sneezing, blowing your nose or touching your face, nose or eyes. °Do not shake hands or touch anyone and try to avoid touching surfaces that other people use such as doorknobs, shopping carts, telephones and computer keyboards. °Use tissues and dispose of them properly in a garbage can or ziplock bag. °Cough into your sleeve. °Do not let others eat or   drink after you. ° °It's also important to recognize the signs of serious illness and get evaluated if they occur: °Any respiratory infection that lasts more than 7 to 10 days.  Yellow nasal drainage and sputum are not reliable indicators of a bacterial infection, but if they last for more than 1 week, see your doctor. °Fever and sore throat can indicate strep. °Fever and cough can indicate influenza or pneumonia. °Any kind of severe symptom such as difficulty breathing, intractable vomiting, or severe pain should prompt you to see  a doctor as soon as possible. ° ° °Your body's immune system is really the thing that will get rid of this infection.  Your immune system is comprised of 2 types of specialized cells called T cells and B cells.  T cells coordinate the array of cells in your body that engulf invading bacteria or viruses while B cells orchestrate the production of antibodies that neutralize infection.  Anything we do or any medications we give you, will just strengthen your immune system or help it clear up the infection quicker.  Here are a few helpful hints to improve your immune system to help overcome this illness or to prevent future infections: °· A few vitamins can improve the health of your immune system.  That's why your diet should include plenty of fruits, vegetables, fish, nuts, and whole grains. °· Vitamin A and bet-carotene can increase the cells that fight infections (T cells and B cells).  Vitamin A is abundant in dark greens and orange vegetables such as spinach, greens, sweet potatoes, and carrots. °· Vitamin B6 contributes to the maturation of white blood cells, the cells that fight disease.  Foods with vitamin B6 include cold cereal and bananas. °· Vitamin C is credited with preventing colds because it increases white blood cells and also prevents cellular damage.  Citrus fruits, peaches and green and red bell peppers are all hight in vitamin C. °· Vitamin E is an anti-oxidant that encourages the production of natural killer cells which reject foreign invaders and B cells that produce antibodies.  Foods high in vitamin E include wheat germ, nuts and seeds. °· Foods high in omega-3 fatty acids found in foods like salmon, tuna and mackerel boost your immune system and help cells to engulf and absorb germs. °· Probiotics are good bacteria that increase your T cells.  These can be found in yogurt and are available in supplements such as Culturelle or Align. °· Moderate exercise increases the strength of your immune  system and your ability to recover from illness.  I suggest 3 to 5 moderate intensity 30 minute workouts per week.   °· Sleep is another component of maintaining a strong immune system.  It enables your body to recuperate from the day's activities, stress and work.  My recommendation is to get between 7 and 9 hours of sleep per night. °· If you smoke, try to quit completely or at least cut down.  Drink alcohol only in moderation if at all.  No more than 2 drinks daily for men or 1 for women. °· Get a flu vaccine early in the fall or if you have not gotten one yet, once this illness has run its course.  If you are over 65, a smoker, or an asthmatic, get a pneumococcal vaccine. °· My final recommendation is to maintain a healthy weight.  Excess weight can impair the immune system by interfering with the way the immune system deals with invading viruses or   bacteria.   Sinusitis Sinusitis is redness, soreness, and swelling (inflammation) of the paranasal sinuses. Paranasal sinuses are air pockets within the bones of your face (beneath the eyes, the middle of the forehead, or above the eyes). In healthy paranasal sinuses, mucus is able to drain out, and air is able to circulate through them by way of your nose. However, when your paranasal sinuses are inflamed, mucus and air can become trapped. This can allow bacteria and other germs to grow and cause infection. Sinusitis can develop quickly and last only a short time (acute) or continue over a long period (chronic). Sinusitis that lasts for more than 12 weeks is considered chronic.  CAUSES  Causes of sinusitis include:  Allergies.  Structural abnormalities, such as displacement of the cartilage that separates your nostrils (deviated septum), which can decrease the air flow through your nose and sinuses and affect sinus drainage.  Functional abnormalities, such as when the small hairs (cilia) that line your sinuses and help remove mucus do not work properly  or are not present. SYMPTOMS  Symptoms of acute and chronic sinusitis are the same. The primary symptoms are pain and pressure around the affected sinuses. Other symptoms include:  Upper toothache.  Earache.  Headache.  Bad breath.  Decreased sense of smell and taste.  A cough, which worsens when you are lying flat.  Fatigue.  Fever.  Thick drainage from your nose, which often is green and may contain pus (purulent).  Swelling and warmth over the affected sinuses. DIAGNOSIS  Your caregiver will perform a physical exam. During the exam, your caregiver may:  Look in your nose for signs of abnormal growths in your nostrils (nasal polyps).  Tap over the affected sinus to check for signs of infection.  View the inside of your sinuses (endoscopy) with a special imaging device with a light attached (endoscope), which is inserted into your sinuses. If your caregiver suspects that you have chronic sinusitis, one or more of the following tests may be recommended:  Allergy tests.  Nasal culture--A sample of mucus is taken from your nose and sent to a lab and screened for bacteria.  Nasal cytology--A sample of mucus is taken from your nose and examined by your caregiver to determine if your sinusitis is related to an allergy. TREATMENT  Most cases of acute sinusitis are related to a viral infection and will resolve on their own within 10 days. Sometimes medicines are prescribed to help relieve symptoms (pain medicine, decongestants, nasal steroid sprays, or saline sprays).  However, for sinusitis related to a bacterial infection, your caregiver will prescribe antibiotic medicines. These are medicines that will help kill the bacteria causing the infection.  Rarely, sinusitis is caused by a fungal infection. In theses cases, your caregiver will prescribe antifungal medicine. For some cases of chronic sinusitis, surgery is needed. Generally, these are cases in which sinusitis recurs more  than 3 times per year, despite other treatments. HOME CARE INSTRUCTIONS   Drink plenty of water. Water helps thin the mucus so your sinuses can drain more easily.  Use a humidifier.  Inhale steam 3 to 4 times a day (for example, sit in the bathroom with the shower running).  Apply a warm, moist washcloth to your face 3 to 4 times a day, or as directed by your caregiver.  Use saline nasal sprays to help moisten and clean your sinuses.  Take over-the-counter or prescription medicines for pain, discomfort, or fever only as directed by your caregiver. SEEK  IMMEDIATE MEDICAL CARE IF:  You have increasing pain or severe headaches.  You have nausea, vomiting, or drowsiness.  You have swelling around your face.  You have vision problems.  You have a stiff neck.  You have difficulty breathing. MAKE SURE YOU:   Understand these instructions.  Will watch your condition.  Will get help right away if you are not doing well or get worse. Document Released: 08/05/2005 Document Revised: 10/28/2011 Document Reviewed: 08/20/2011 [facility] Patient Information 2015 [place], [place]. This information is not intended to replace advice given to you by your health care provider. Make sure you discuss any questions you have with your health care provider.

## 2014-02-09 NOTE — ED Provider Notes (Signed)
  Chief Complaint   Chief Complaint  Patient presents with  . Facial Pain    History of Present Illness   Edward [name] is a 43 year old male who has had a four-day history of productive cough, aching in her ribs, nasal congestion with yellow, bloody drainage, sneezing, headache, sinus pressure, ear congestion, subjective fever, chills, and sore throat. He denies any GI symptoms. His son has been sick with the same thing.  Review of Systems   Other than as noted above, the patient denies any of the following symptoms: Systemic:  No fevers, chills, sweats, or myalgias. Eye:  No redness or discharge. ENT:  No ear pain, headache, nasal congestion, drainage, sinus pressure, or sore throat. Neck:  No neck pain, stiffness, or swollen glands. Lungs:  No cough, sputum production, hemoptysis, wheezing, chest tightness, shortness of breath or chest pain. GI:  No abdominal pain, nausea, vomiting or diarrhea.  [place]   Past medical history, family history, social history, meds, and allergies were reviewed. He has diabetes and takes glimepiride and metformin.  Physical exam   Vital signs:  BP 141/89  Pulse 83  Temp(Src) 98.2 F (36.8 C) (Oral)  Resp 16  SpO2 99% General:  Alert and oriented.  In no distress.  Skin warm and dry. Eye:  No conjunctival injection or drainage. Lids were normal. ENT:  Right TM was mildly erythematous without any fluid or pus behind the TM and normal canal. Left TM and canal are normal.  Nasal mucosa was congested with yellow drainage.  Mucous membranes were moist.  Pharynx was clear with no exudate or drainage.  There were no oral ulcerations or lesions. Neck:  Supple, no adenopathy, tenderness or mass. Lungs:  No respiratory distress.  Lungs were clear to auscultation, without wheezes, rales or rhonchi.  Breath sounds were clear and equal bilaterally.  Heart:  Regular rhythm, without gallops, murmers or rubs. Skin:  Clear, warm, and dry, without rash or  lesions.  Assessment     The encounter diagnosis was Acute sinusitis, recurrence not specified, unspecified location.  Plan    1.  Meds:  The following meds were prescribed:   Discharge Medication List as of 02/09/2014 10:22 AM    START taking these medications   Details  amoxicillin-clavulanate (AUGMENTIN) 875-125 MG per tablet Take 1 tablet by mouth 2 (two) times daily., Starting 02/09/2014, Until Discontinued, Normal        2.  Patient Education/Counseling:  The patient was given appropriate handouts, self care instructions, and instructed in symptomatic relief.  Instructed to get extra fluids, rest, and use a cool mist vaporizer.  Patient was encouraged to quit smoking.  3.  Follow up:  The patient was told to follow up here if no better in 3 to 4 days, or sooner if becoming worse in any way, and given some red flag symptoms such as increasing fever, difficulty breathing, chest pain, or persistent vomiting which would prompt immediate return.  Follow up here as needed.      [name], MD 02/09/14 [phone]

## 2014-02-09 NOTE — ED Notes (Addendum)
State she has been sick since Monday w same thing his son had last week, cough, sinus drainage. Also c/o pain in his knee for a couple of months/years. Has reported history of DM, takes PO medication when his DM is off , checks his sugar occasionally.  Worried he has the "gnat plague" that [place]

## 2014-02-19 ENCOUNTER — Emergency Department (HOSPITAL_COMMUNITY)
Admission: EM | Admit: 2014-02-19 | Discharge: 2014-02-19 | Disposition: A | Discharge status: Home / Self Care | Payer: 59 | Attending: Emergency Medicine | Admitting: Emergency Medicine

## 2014-02-19 ENCOUNTER — Emergency Department (HOSPITAL_COMMUNITY): Payer: 59

## 2014-02-19 ENCOUNTER — Encounter (HOSPITAL_COMMUNITY): Payer: Self-pay | Admitting: Emergency Medicine

## 2014-02-19 DIAGNOSIS — M869 Osteomyelitis, unspecified: Secondary | ICD-10-CM

## 2014-02-19 DIAGNOSIS — Z87891 Personal history of nicotine dependence: Secondary | ICD-10-CM | POA: Insufficient documentation

## 2014-02-19 DIAGNOSIS — E119 Type 2 diabetes mellitus without complications: Secondary | ICD-10-CM | POA: Insufficient documentation

## 2014-02-19 DIAGNOSIS — Z79899 Other long term (current) drug therapy: Secondary | ICD-10-CM | POA: Insufficient documentation

## 2014-02-19 DIAGNOSIS — Y9289 Other specified places as the place of occurrence of the external cause: Secondary | ICD-10-CM | POA: Insufficient documentation

## 2014-02-19 DIAGNOSIS — W230XXA Caught, crushed, jammed, or pinched between moving objects, initial encounter: Secondary | ICD-10-CM | POA: Insufficient documentation

## 2014-02-19 DIAGNOSIS — Y9389 Activity, other specified: Secondary | ICD-10-CM | POA: Insufficient documentation

## 2014-02-19 DIAGNOSIS — I10 Essential (primary) hypertension: Secondary | ICD-10-CM | POA: Insufficient documentation

## 2014-02-19 DIAGNOSIS — R739 Hyperglycemia, unspecified: Secondary | ICD-10-CM

## 2014-02-19 DIAGNOSIS — S91109A Unspecified open wound of unspecified toe(s) without damage to nail, initial encounter: Secondary | ICD-10-CM | POA: Insufficient documentation

## 2014-02-19 DIAGNOSIS — S91209A Unspecified open wound of unspecified toe(s) with damage to nail, initial encounter: Secondary | ICD-10-CM

## 2014-02-19 LAB — CBC WITH DIFFERENTIAL/PLATELET
BASOS ABS: 0 10*3/uL (ref 0.0–0.1)
Basophils Relative: 1 % (ref 0–1)
Eosinophils Absolute: 0.3 10*3/uL (ref 0.0–0.7)
Eosinophils Relative: 6 % — ABNORMAL HIGH (ref 0–5)
HEMATOCRIT: 40.1 % (ref 39.0–52.0)
HEMOGLOBIN: 13.6 g/dL (ref 13.0–17.0)
LYMPHS PCT: 38 % (ref 12–46)
Lymphs Abs: 2.2 10*3/uL (ref 0.7–4.0)
MCH: 28.5 pg (ref 26.0–34.0)
MCHC: 33.9 g/dL (ref 30.0–36.0)
MCV: 84.1 fL (ref 78.0–100.0)
MONO ABS: 0.5 10*3/uL (ref 0.1–1.0)
MONOS PCT: 8 % (ref 3–12)
NEUTROS ABS: 2.7 10*3/uL (ref 1.7–7.7)
Neutrophils Relative %: 47 % (ref 43–77)
Platelets: 233 10*3/uL (ref 150–400)
RBC: 4.77 MIL/uL (ref 4.22–5.81)
RDW: 12.7 % (ref 11.5–15.5)
WBC: 5.8 10*3/uL (ref 4.0–10.5)

## 2014-02-19 LAB — COMPREHENSIVE METABOLIC PANEL
ALBUMIN: 3.6 g/dL (ref 3.5–5.2)
ALT: 14 U/L (ref 0–53)
ANION GAP: 14 (ref 5–15)
AST: 15 U/L (ref 0–37)
Alkaline Phosphatase: 50 U/L (ref 39–117)
BUN: 20 mg/dL (ref 6–23)
CALCIUM: 8.8 mg/dL (ref 8.4–10.5)
CHLORIDE: 103 meq/L (ref 96–112)
CO2: 22 meq/L (ref 19–32)
Creatinine, Ser: 1.11 mg/dL (ref 0.50–1.35)
GFR calc non Af Amer: 79 mL/min — ABNORMAL LOW (ref >90)
Glucose, Bld: 192 mg/dL — ABNORMAL HIGH (ref 70–99)
Potassium: 4.4 mEq/L (ref 3.7–5.3)
Sodium: 139 mEq/L (ref 137–147)
TOTAL PROTEIN: 6.7 g/dL (ref 6.0–8.3)
Total Bilirubin: 0.5 mg/dL (ref 0.3–1.2)

## 2014-02-19 LAB — CBG MONITORING, ED: Glucose-Capillary: 165 mg/dL — ABNORMAL HIGH (ref 70–99)

## 2014-02-19 MED ORDER — HYDROCODONE-ACETAMINOPHEN 5-325 MG PO TABS: 1.0000 | ORAL_TABLET | Freq: Four times a day (QID) | ORAL | Status: DC | PRN

## 2014-02-19 MED ORDER — DOXYCYCLINE HYCLATE 100 MG PO CAPS: 100.0000 mg | ORAL_CAPSULE | Freq: Two times a day (BID) | ORAL | Status: DC

## 2014-02-19 NOTE — ED Notes (Signed)
Pt verbalizes understanding of d/c instructions and denies any further needs at this time. 

## 2014-02-19 NOTE — ED Provider Notes (Signed)
CSN: [id number]     Arrival date & time 02/19/14  1851 History   First MD Initiated Contact with Patient 02/19/14 1912     Chief Complaint  Patient presents with  . Nail Problem     (Consider location/radiation/quality/duration/timing/severity/associated sxs/prior Treatment) HPI Edward [name] is a 43 y.o. male who presents to emergency department complaining of a right toenail injury. Patient states that he has high thickened toenails for several years. States he caught his toe on a carpet and it pulled the toe nail partially off. He states he tried to pull it off himself but was unable to. He denies any injury to the toe itself. He denies any swelling, bleeding, bruising to the toe or the toenail. He denies any prior pain to the toe. He is diabetic. He denies any other complaints. He states palpating the toenail makes the pain worse, nothing makes it better. Pain does not radiate   Past Medical History  Diagnosis Date  . Diabetes mellitus   . Hypertension    Past Surgical History  Procedure Laterality Date  . Irrigation and debridement abscess  08/12/2011    Procedure: IRRIGATION AND DEBRIDEMENT ABSCESS;  Surgeon: Molli Hazard, MD;  Location: [place];  Service: Urology;  Laterality: N/A;  irrigation and debridment perineum    . Incision and drainage of wound  08/12/2011    Procedure: IRRIGATION AND DEBRIDEMENT WOUND;  Surgeon: Zenovia Jarred, MD;  Location: [place];  Service: General;;  perineum  . Irrigation and debridement abscess  08/14/2011    Procedure: MINOR INCISION AND DRAINAGE OF ABSCESS;  Surgeon: Molli Hazard, MD;  Location: [place];  Service: Urology;  Laterality: N/A;  Perineal Wound Debridement;Placement Bilateral Testicular Thigh Pouches  . I&d extremity  09/02/2011    Procedure: IRRIGATION AND DEBRIDEMENT EXTREMITY;  Surgeon: Theodoro Kos, DO;  Location: [place];  Service: Plastics;  Laterality: N/A;  . Circumcision  09/02/2011    Procedure:  CIRCUMCISION ADULT;  Surgeon: Molli Hazard, MD;  Location: [place];  Service: Urology;  Laterality: N/A;   No family history on file. History  Substance Use Topics  . Smoking status: Former Smoker    Quit date: 08/20/2011  . Smokeless tobacco: Never Used  . Alcohol Use: Yes     Comment: has not had alcohol in a month    Review of Systems  Constitutional: Negative for fever and chills.  Musculoskeletal: Positive for arthralgias.  Skin: Positive for wound.  Neurological: Negative for weakness and numbness.  All other systems reviewed and are negative.     Allergies  Review of patient's allergies indicates no known allergies.  Home Medications   Prior to Admission medications   Medication Sig Start Date End Date Taking? Authorizing Provider  amoxicillin-clavulanate (AUGMENTIN) 875-125 MG per tablet Take 1 tablet by mouth 2 (two) times daily. 02/09/14   [name], MD  glimepiride (AMARYL) 2 MG tablet Take 1 tablet (2 mg total) by mouth daily with breakfast. 11/03/13   [name], MD  glucose blood test strip Use as instructed 11/03/13   [name], MD  HYDROcodone-acetaminophen (VICODIN) 5-500 MG per tablet 5/325 mg 09/02/11   Claire Sanger, DO  lisinopril (PRINIVIL,ZESTRIL) 20 MG tablet Take 20 mg by mouth daily.     Historical Provider, MD  meloxicam (MOBIC) 15 MG tablet Take 1 tablet (15 mg total) by mouth daily. 04/27/13   [name], MD  metFORMIN (GLUCOPHAGE) 850 MG  tablet Take 850 mg by mouth 2 (two) times daily with a meal. Patient is poor historian related to medication    Historical Provider, MD  methocarbamol (ROBAXIN) 500 MG tablet Take 1 tablet (500 mg total) by mouth 3 (three) times daily. 04/27/13   [name], MD  sitaGLIPtin-metformin (JANUMET) 50-1000 MG per tablet Take 1 tablet by mouth 2 (two) times daily with a meal. 11/03/13   [name], MD  sitaGLIPtin-metformin (JANUMET) 50-1000 MG per tablet Take 1 tablet by mouth  2 (two) times daily with a meal. 11/03/13   [name], MD   BP 144/89  Pulse 88  Temp(Src) 98.3 F (36.8 C) (Oral)  Resp 18  SpO2 98% Physical Exam  Nursing note and vitals reviewed. Constitutional: He appears well-developed and well-nourished. No distress.  HENT:  Head: Normocephalic.  Neck: Neck supple.  Cardiovascular: Normal rate, regular rhythm and normal heart sounds.   Pulmonary/Chest: Effort normal and breath sounds normal. No respiratory distress. He has no wheezes. He has no rales.  Abdominal: Soft. There is no tenderness.  Neurological: He is alert.  Skin: Skin is warm and dry.  Thickened the right first toenail, partially avulsed. 2 appears to be normal with full range of motion at MCP and IP joints. No tenderness to palpation of the toe. Sensation and capillary refill normal distally. Hemostatic the    ED Course  NAIL REMOVAL Date/Time: 02/19/2014 8:59 PM Performed by: Jeannett Senior A Authorized by: Jeannett Senior A Consent: Verbal consent obtained. Risks and benefits: risks, benefits and alternatives were discussed Consent given by: patient Patient understanding: patient states understanding of the procedure being performed Patient identity confirmed: verbally with patient Location: right foot Location details: right big toe Anesthesia: digital block Local anesthetic: lidocaine 2% without epinephrine Patient sedated: no Preparation: skin prepped with Betadine Amount removed: complete Wedge excision of skin of nail fold: no Nail bed sutured: no Dressing: petrolatum-impregnated gauze   (including critical care time) Labs Review Labs Reviewed  CBG MONITORING, ED - Abnormal; Notable for the following:    Glucose-Capillary 165 (*)    All other components within normal limits  CBC WITH DIFFERENTIAL  COMPREHENSIVE METABOLIC PANEL    Imaging Review Dg Foot Complete Right  02/19/2014   CLINICAL DATA:  Discoloration of he nail of the great toe.   EXAM: RIGHT FOOT COMPLETE - 3+ VIEW  COMPARISON:  None.  FINDINGS: Second through fifth toes appear normal. No abnormality of the midfoot or hindfoot other than slight pes planus. There is erosion of the distal phalanx of the great toe suspicious for osteomyelitis. The nail appears loose.  IMPRESSION: Findings suspicious for osteomyelitis of the distal phalanx of the great toe.   Electronically Signed   By: Nelson Chimes M.D.   On: 02/19/2014 19:32     EKG Interpretation None      MDM   Final diagnoses:  Toenail avulsion, initial encounter  Osteomyelitis of toe of right foot  Hyperglycemia   He should emergency department with right big toe nail avulsion. Will get x-ray labs.  8:52 PM X-ray of the toe suspicious for osteomyelitis. Spoke with Dr. Ninfa Linden with orthopedics. Advised to start on doxycycline and follow up with him in the office.    Labs unremarkable other than hyperglycemia. Toenail removed, and sterile dressing applied. Will discharge home with doxycycline and close followup with Dr. Ninfa Linden.   Filed Vitals:   02/19/14 1900 02/19/14 2024 02/19/14 2117  BP: 144/89 142/94 142/95  Pulse: 88 63 61  Temp: 98.3 F (36.8 C) 98.3 F (36.8 C) 98.3 F (36.8 C)  TempSrc: Oral Oral Oral  Resp: 18 18 16   SpO2: 98% 93% 91%      Florene Route Claressa Hughley, PA-C 02/20/14 0411

## 2014-02-19 NOTE — ED Notes (Signed)
Report called to [place], Therapist, sports.

## 2014-02-19 NOTE — Discharge Instructions (Signed)
Keep toe clean and covered. Use petroleum or non stick gauze. Follow up with Dr. Ninfa Linden  In the office regarding possible infection. Take antibiotics as prescribed daily. norco for pain as needed.    Nail Avulsion Injury Nail avulsion means that you have lost the whole, or part of a nail. The nail will usually grow back in 2 to 6 months. If your injury damaged the growth center of the nail, the nail may be deformed, split, or not stuck to the nail bed. Sometimes the avulsed nail is stitched back in place. This provides temporary protection to the nail bed until the new nail grows in.  HOME CARE INSTRUCTIONS   Raise (elevate) your injury as much as possible.  Protect the injury and cover it with bandages (dressings) or splints as instructed.  Change dressings as instructed. SEEK MEDICAL CARE IF:   There is increasing pain, redness, or swelling.  You cannot move your fingers or toes. Document Released: 09/12/2004 Document Revised: 10/28/2011 Document Reviewed: 07/07/2009 [facility] Patient Information 2015 [place], [place]. This information is not intended to replace advice given to you by your health care provider. Make sure you discuss any questions you have with your health care provider.

## 2014-02-19 NOTE — ED Notes (Signed)
Pt reports that his right big toe nail became snagged on the carpet. Pt presents with toenail hanging from nail bed. Pt has history of Diabetes.

## 2014-02-20 NOTE — ED Provider Notes (Signed)
Medical screening examination/treatment/procedure(s) were performed by non-physician practitioner and as supervising physician I was immediately available for consultation/collaboration.   EKG Interpretation None        Blanchard Kelch, MD 02/20/14 1124

## 2014-05-04 ENCOUNTER — Emergency Department (INDEPENDENT_AMBULATORY_CARE_PROVIDER_SITE_OTHER)
Admission: EM | Admit: 2014-05-04 | Discharge: 2014-05-04 | Disposition: A | Discharge status: Home / Self Care | Payer: BC Managed Care – PPO | Source: Home / Self Care | Attending: Family Medicine | Admitting: Family Medicine

## 2014-05-04 ENCOUNTER — Encounter (HOSPITAL_COMMUNITY): Payer: Self-pay | Admitting: Emergency Medicine

## 2014-05-04 DIAGNOSIS — N179 Acute kidney failure, unspecified: Secondary | ICD-10-CM

## 2014-05-04 DIAGNOSIS — E1129 Type 2 diabetes mellitus with other diabetic kidney complication: Secondary | ICD-10-CM

## 2014-05-04 DIAGNOSIS — R7309 Other abnormal glucose: Secondary | ICD-10-CM

## 2014-05-04 DIAGNOSIS — R739 Hyperglycemia, unspecified: Secondary | ICD-10-CM

## 2014-05-04 LAB — POCT URINALYSIS DIP (DEVICE)
BILIRUBIN URINE: NEGATIVE
Glucose, UA: 500 mg/dL — AB
Ketones, ur: NEGATIVE mg/dL
LEUKOCYTES UA: NEGATIVE
NITRITE: NEGATIVE
PROTEIN: 100 mg/dL — AB
Specific Gravity, Urine: 1.02 (ref 1.005–1.030)
Urobilinogen, UA: 0.2 mg/dL (ref 0.0–1.0)
pH: 5.5 (ref 5.0–8.0)

## 2014-05-04 LAB — POCT I-STAT, CHEM 8
BUN: 25 mg/dL — ABNORMAL HIGH (ref 6–23)
Calcium, Ion: 1.15 mmol/L (ref 1.12–1.23)
Chloride: 102 mEq/L (ref 96–112)
Creatinine, Ser: 1.7 mg/dL — ABNORMAL HIGH (ref 0.50–1.35)
Glucose, Bld: 334 mg/dL — ABNORMAL HIGH (ref 70–99)
HEMATOCRIT: 50 % (ref 39.0–52.0)
Hemoglobin: 17 g/dL (ref 13.0–17.0)
Potassium: 4.6 mEq/L (ref 3.7–5.3)
SODIUM: 134 meq/L — AB (ref 137–147)
TCO2: 25 mmol/L (ref 0–100)

## 2014-05-04 LAB — GLUCOSE, CAPILLARY: GLUCOSE-CAPILLARY: 278 mg/dL — AB (ref 70–99)

## 2014-05-04 MED ORDER — INSULIN ASPART 100 UNIT/ML ~~LOC~~ SOLN
SUBCUTANEOUS | Status: AC
  Filled 2014-05-04: qty 1

## 2014-05-04 MED ORDER — SODIUM CHLORIDE 0.9 % IV BOLUS (SEPSIS)
1000.0000 mL | Freq: Once | INTRAVENOUS | Status: AC
  Administered 2014-05-04: 1000 mL via INTRAVENOUS

## 2014-05-04 MED ORDER — INSULIN ASPART 100 UNIT/ML ~~LOC~~ SOLN
5.0000 [IU] | Freq: Once | SUBCUTANEOUS | Status: AC
  Administered 2014-05-04: 5 [IU] via SUBCUTANEOUS

## 2014-05-04 NOTE — ED Notes (Signed)
Iv  Ns  1  Liter    Bolus     Via  20  Angio l  Arm 1  att

## 2014-05-04 NOTE — ED Provider Notes (Signed)
CSN: [id number]     Arrival date & time 05/04/14  [id number] History   None    Chief Complaint  Patient presents with  . Fatigue   (Consider location/radiation/quality/duration/timing/severity/associated sxs/prior Treatment) HPI  Feeling poorly since last night. Associated w/ nausea, dry mouth, mild HA, diaphoresis, feelings of being "stuck in a closet." improved w/ rest. Feeling anxious. Oral medications for DM only and taking them as prescribed. Denies CP, palpitations, SOB. Increased kool-aid and juice intake recently, typically drinks diet drinks.    Past Medical History  Diagnosis Date  . Diabetes mellitus   . Hypertension    Past Surgical History  Procedure Laterality Date  . Irrigation and debridement abscess  08/12/2011    Procedure: IRRIGATION AND DEBRIDEMENT ABSCESS;  Surgeon: Molli Hazard, MD;  Location: [place];  Service: Urology;  Laterality: N/A;  irrigation and debridment perineum    . Incision and drainage of wound  08/12/2011    Procedure: IRRIGATION AND DEBRIDEMENT WOUND;  Surgeon: Zenovia Jarred, MD;  Location: [place];  Service: General;;  perineum  . Irrigation and debridement abscess  08/14/2011    Procedure: MINOR INCISION AND DRAINAGE OF ABSCESS;  Surgeon: Molli Hazard, MD;  Location: [place];  Service: Urology;  Laterality: N/A;  Perineal Wound Debridement;Placement Bilateral Testicular Thigh Pouches  . I&d extremity  09/02/2011    Procedure: IRRIGATION AND DEBRIDEMENT EXTREMITY;  Surgeon: Theodoro Kos, DO;  Location: [place];  Service: Plastics;  Laterality: N/A;  . Circumcision  09/02/2011    Procedure: CIRCUMCISION ADULT;  Surgeon: Molli Hazard, MD;  Location: [place];  Service: Urology;  Laterality: N/A;   History reviewed. No pertinent family history. History  Substance Use Topics  . Smoking status: Former Smoker    Quit date: 08/20/2011  . Smokeless tobacco: Never Used  . Alcohol Use: Yes   Comment: has not had alcohol in a month    Review of Systems  Per HPI with all other pertinent systems negative.   Allergies  Review of patient's allergies indicates no known allergies.  Home Medications   Prior to Admission medications   Medication Sig Start Date End Date Taking? Authorizing Provider  doxycycline (VIBRAMYCIN) 100 MG capsule Take 1 capsule (100 mg total) by mouth 2 (two) times daily. 02/19/14   Tatyana A Kirichenko, PA-C  glimepiride (AMARYL) 2 MG tablet Take 1 tablet (2 mg total) by mouth daily with breakfast. 11/03/13   [name], MD  glucose blood test strip 1 each by Other route See admin instructions. Check blood sugar once daily as needed.    Historical Provider, MD  HYDROcodone-acetaminophen (NORCO) 5-325 MG per tablet Take 1 tablet by mouth every 6 (six) hours as needed for moderate pain. 02/19/14   Tatyana A Kirichenko, PA-C  ibuprofen (ADVIL,MOTRIN) 200 MG tablet Take 800 mg by mouth every 6 (six) hours as needed.    Historical Provider, MD  sitaGLIPtin-metformin (JANUMET) 50-1000 MG per tablet Take 1 tablet by mouth 2 (two) times daily with a meal. 11/03/13   [name], MD   BP 139/97  Pulse 96  Temp(Src) 98.1 F (36.7 C) (Oral)  Resp 16  SpO2 100% Physical Exam  Constitutional: He is oriented to person, place, and time. He appears well-developed and well-nourished. No distress.  HENT:  Head: Normocephalic and atraumatic.  Eyes: EOM are normal. Pupils are equal, round, and reactive to light.  Neck: Normal range of motion.  Cardiovascular: Normal rate,  normal heart sounds and intact distal pulses.  Exam reveals no gallop.   No murmur heard. Pulmonary/Chest: Effort normal and breath sounds normal.  Abdominal: Soft. Bowel sounds are normal.  Musculoskeletal: Normal range of motion. He exhibits no edema and no tenderness.  Neurological: He is alert and oriented to person, place, and time. No cranial nerve deficit.  Skin: Skin is warm. He is not  diaphoretic.  Psychiatric: His behavior is normal. Judgment and thought content normal.    ED Course  Procedures (including critical care time) Labs Review Labs Reviewed  GLUCOSE, CAPILLARY - Abnormal; Notable for the following:    Glucose-Capillary 278 (*)    All other components within normal limits  POCT URINALYSIS DIP (DEVICE) - Abnormal; Notable for the following:    Glucose, UA 500 (*)    Hgb urine dipstick SMALL (*)    Protein, ur 100 (*)    All other components within normal limits  POCT I-STAT, CHEM 8 - Abnormal; Notable for the following:    Sodium 134 (*)    BUN 25 (*)    Creatinine, Ser 1.70 (*)    Glucose, Bld 334 (*)    All other components within normal limits    Imaging Review No results found.   MDM   1. Type 2 diabetes mellitus with other diabetic kidney complication   2. Acute kidney injury   3. Hyperglycemia    Acute kidney injury. Cr 1.7 today w/ baseline aroun 1.1. Likely secondary to dehydration and HTN and DM. 1L NS bolus.   Hyperglycemia: 5 units novolog. Repeat CBG 270 improved over initial 334.   Detailed precausions and f/u w/ PCP w/in 7 days for repeat labs and likely starting injectables  Linna Darner, MD Family Medicine 05/04/2014, 11:45 AM      Waldemar Dickens, MD 05/04/14 1145

## 2014-05-04 NOTE — Discharge Instructions (Signed)
Your sugar was very high today and you were dehydrated.  The fluids and insulin given you today helped to reverse your condition Please call your physicians office today and follow up in their office in the next week. Cut back on the sugar intake Please go to the emergency room if you are symptomatic if your sugar is above 300. If its less than 100 then please eat something to get it up.

## 2014-05-04 NOTE — ED Notes (Signed)
Pt reports     Symptoms  Of     Chest  Pain  Last  Pm    With    No  Symptoms  Of  Pain  At  This time          He  Also  Reports  Some  Shortness  Of  Breath       But  Is         Speaking in  Complete  sentances         And  Is   No        Acute     Distress  He  Is  A  Diabetic     Who  Reports taking his  meds  But  Has  Not   Been  Checking  His  Sugar

## 2014-05-28 ENCOUNTER — Emergency Department (HOSPITAL_COMMUNITY)
Admission: EM | Admit: 2014-05-28 | Discharge: 2014-05-28 | Disposition: A | Discharge status: Home / Self Care | Payer: BC Managed Care – PPO | Attending: Emergency Medicine | Admitting: Emergency Medicine

## 2014-05-28 ENCOUNTER — Encounter (HOSPITAL_COMMUNITY): Payer: Self-pay | Admitting: Emergency Medicine

## 2014-05-28 ENCOUNTER — Emergency Department (HOSPITAL_COMMUNITY): Payer: BC Managed Care – PPO

## 2014-05-28 DIAGNOSIS — E13621 Other specified diabetes mellitus with foot ulcer: Secondary | ICD-10-CM

## 2014-05-28 DIAGNOSIS — E13622 Other specified diabetes mellitus with other skin ulcer: Secondary | ICD-10-CM | POA: Insufficient documentation

## 2014-05-28 DIAGNOSIS — I10 Essential (primary) hypertension: Secondary | ICD-10-CM | POA: Diagnosis not present

## 2014-05-28 DIAGNOSIS — Z87891 Personal history of nicotine dependence: Secondary | ICD-10-CM | POA: Insufficient documentation

## 2014-05-28 DIAGNOSIS — L97509 Non-pressure chronic ulcer of other part of unspecified foot with unspecified severity: Secondary | ICD-10-CM

## 2014-05-28 DIAGNOSIS — L97529 Non-pressure chronic ulcer of other part of left foot with unspecified severity: Secondary | ICD-10-CM | POA: Insufficient documentation

## 2014-05-28 DIAGNOSIS — Z79899 Other long term (current) drug therapy: Secondary | ICD-10-CM | POA: Insufficient documentation

## 2014-05-28 DIAGNOSIS — M79672 Pain in left foot: Secondary | ICD-10-CM | POA: Diagnosis present

## 2014-05-28 LAB — BASIC METABOLIC PANEL
ANION GAP: 15 (ref 5–15)
BUN: 27 mg/dL — ABNORMAL HIGH (ref 6–23)
CO2: 21 mEq/L (ref 19–32)
Calcium: 8.9 mg/dL (ref 8.4–10.5)
Chloride: 103 mEq/L (ref 96–112)
Creatinine, Ser: 1.19 mg/dL (ref 0.50–1.35)
GFR calc Af Amer: 84 mL/min — ABNORMAL LOW (ref >90)
GFR, EST NON AFRICAN AMERICAN: 73 mL/min — AB (ref >90)
Glucose, Bld: 248 mg/dL — ABNORMAL HIGH (ref 70–99)
POTASSIUM: 4.2 meq/L (ref 3.7–5.3)
SODIUM: 139 meq/L (ref 137–147)

## 2014-05-28 LAB — CBC
HCT: 39.5 % (ref 39.0–52.0)
Hemoglobin: 13.4 g/dL (ref 13.0–17.0)
MCH: 28 pg (ref 26.0–34.0)
MCHC: 33.9 g/dL (ref 30.0–36.0)
MCV: 82.6 fL (ref 78.0–100.0)
PLATELETS: 265 10*3/uL (ref 150–400)
RBC: 4.78 MIL/uL (ref 4.22–5.81)
RDW: 12.3 % (ref 11.5–15.5)
WBC: 6.4 10*3/uL (ref 4.0–10.5)

## 2014-05-28 LAB — I-STAT CG4 LACTIC ACID, ED: LACTIC ACID, VENOUS: 1.35 mmol/L (ref 0.5–2.2)

## 2014-05-28 MED ORDER — PIPERACILLIN-TAZOBACTAM 3.375 G IVPB 30 MIN
3.3750 g | Freq: Once | INTRAVENOUS | Status: AC
  Administered 2014-05-28: 3.375 g via INTRAVENOUS
  Filled 2014-05-28: qty 50

## 2014-05-28 MED ORDER — VANCOMYCIN HCL IN DEXTROSE 1-5 GM/200ML-% IV SOLN
1000.0000 mg | Freq: Once | INTRAVENOUS | Status: AC
  Administered 2014-05-28: 1000 mg via INTRAVENOUS
  Filled 2014-05-28: qty 200

## 2014-05-28 MED ORDER — SODIUM CHLORIDE 0.9 % IV BOLUS (SEPSIS)
1000.0000 mL | Freq: Once | INTRAVENOUS | Status: AC
  Administered 2014-05-28: 1000 mL via INTRAVENOUS

## 2014-05-28 MED ORDER — DOXYCYCLINE HYCLATE 100 MG PO CAPS: 100.0000 mg | ORAL_CAPSULE | Freq: Two times a day (BID) | ORAL | Status: DC

## 2014-05-28 MED ORDER — VANCOMYCIN HCL IN DEXTROSE 1-5 GM/200ML-% IV SOLN: 1000.0000 mg | Freq: Once | INTRAVENOUS | Status: DC

## 2014-05-28 MED ORDER — MORPHINE SULFATE 4 MG/ML IJ SOLN
4.0000 mg | Freq: Once | INTRAMUSCULAR | Status: AC
  Administered 2014-05-28: 4 mg via INTRAVENOUS
  Filled 2014-05-28: qty 1

## 2014-05-28 MED ORDER — OXYCODONE-ACETAMINOPHEN 5-325 MG PO TABS: 1.0000 | ORAL_TABLET | Freq: Four times a day (QID) | ORAL | Status: DC | PRN

## 2014-05-28 NOTE — ED Notes (Signed)
Pt reports that about a week. Reports that the area started small but has gotten larger over the week. Odor noted.

## 2014-05-28 NOTE — Discharge Instructions (Signed)
Take motrin for pain. Take percocet for severe pain. Do NOT drive with it.   Take doxycyline for a week.   Follow up with wound care center. Also talk to [facility] ortho about toe ulcer.   Return to ER if you have fever, worse infection, purulent drainage, severe pain.

## 2014-05-28 NOTE — ED Provider Notes (Signed)
CSN: [id number]     Arrival date & time 05/28/14  1840 History   First MD Initiated Contact with Patient 05/28/14 2011     Chief Complaint  Patient presents with  . Toe Pain     (Consider location/radiation/quality/duration/timing/severity/associated sxs/prior Treatment) The history is provided by the patient.  Edward [name] is a 43 y.o. male hx of DM, HTN here with L big toe pain and redness. He noticed ulcer on L big toe a week ago. He noticed that it is draining purulent drainage and became progressively more painful. Denies fevers. Denies chest pain or shortness of breath or cough or abdominal pain. Never had amputations before.    Past Medical History  Diagnosis Date  . Diabetes mellitus   . Hypertension    Past Surgical History  Procedure Laterality Date  . Irrigation and debridement abscess  08/12/2011    Procedure: IRRIGATION AND DEBRIDEMENT ABSCESS;  Surgeon: Molli Hazard, MD;  Location: [place];  Service: Urology;  Laterality: N/A;  irrigation and debridment perineum    . Incision and drainage of wound  08/12/2011    Procedure: IRRIGATION AND DEBRIDEMENT WOUND;  Surgeon: Zenovia Jarred, MD;  Location: [place];  Service: General;;  perineum  . Irrigation and debridement abscess  08/14/2011    Procedure: MINOR INCISION AND DRAINAGE OF ABSCESS;  Surgeon: Molli Hazard, MD;  Location: [place];  Service: Urology;  Laterality: N/A;  Perineal Wound Debridement;Placement Bilateral Testicular Thigh Pouches  . I&d extremity  09/02/2011    Procedure: IRRIGATION AND DEBRIDEMENT EXTREMITY;  Surgeon: Theodoro Kos, DO;  Location: [place];  Service: Plastics;  Laterality: N/A;  . Circumcision  09/02/2011    Procedure: CIRCUMCISION ADULT;  Surgeon: Molli Hazard, MD;  Location: [place];  Service: Urology;  Laterality: N/A;   History reviewed. No pertinent family history. History  Substance Use Topics  . Smoking status: Former Smoker     Quit date: 08/20/2011  . Smokeless tobacco: Never Used  . Alcohol Use: Yes     Comment: has not had alcohol in a month    Review of Systems  Musculoskeletal:       Toe pain   All other systems reviewed and are negative.     Allergies  Review of patient's allergies indicates no known allergies.  Home Medications   Prior to Admission medications   Medication Sig Start Date End Date Taking? Authorizing Provider  sitaGLIPtin-metformin (JANUMET) 50-1000 MG per tablet Take 1 tablet by mouth 2 (two) times daily with a meal. 11/03/13  Yes [name], MD  glucose blood test strip 1 each by Other route See admin instructions. Check blood sugar once daily as needed.    Historical Provider, MD   BP 138/88  Pulse 77  Temp(Src) 99 F (37.2 C) (Oral)  Resp 17  SpO2 100% Physical Exam  Nursing note and vitals reviewed. Constitutional: He is oriented to person, place, and time. He appears well-nourished.  Slightly uncomfortable   HENT:  Head: Normocephalic.  Mouth/Throat: Oropharynx is clear and moist.  Eyes: Conjunctivae and EOM are normal. Pupils are equal, round, and reactive to light.  Neck: Normal range of motion. Neck supple.  Cardiovascular: Normal rate, regular rhythm and normal heart sounds.   Pulmonary/Chest: Effort normal and breath sounds normal. No respiratory distress. He has no wheezes. He has no rales.  Abdominal: Soft. Bowel sounds are normal. He exhibits no distension. There is no tenderness. There is  no rebound.  Musculoskeletal: Normal range of motion.  L big toe with deep ulcer. No obvious purulent discharge. Minimal erythema. 2+ pulses   Neurological: He is alert and oriented to person, place, and time. No cranial nerve deficit. Coordination normal.  Skin: Skin is warm and dry.  Psychiatric: He has a normal mood and affect. His behavior is normal. Judgment and thought content normal.    ED Course  Procedures (including critical care time) Labs  Review Labs Reviewed  BASIC METABOLIC PANEL - Abnormal; Notable for the following:    Glucose, Bld 248 (*)    BUN 27 (*)    GFR calc non Af Amer 73 (*)    GFR calc Af Amer 84 (*)    All other components within normal limits  CBC  I-STAT CG4 LACTIC ACID, ED    Imaging Review Dg Foot Complete Left  05/28/2014   CLINICAL DATA:  Acute onset of left foot pain and swelling, particularly about the left great toe. Initial encounter.  EXAM: LEFT FOOT - COMPLETE 3+ VIEW  COMPARISON:  None.  FINDINGS: There is no evidence of fracture or dislocation. No osseous erosions are seen. The joint spaces are preserved. There is no evidence of talar subluxation; the subtalar joint is unremarkable in appearance. Mild pes planus is noted.  There appears to be a soft tissue defect at the medial plantar aspect of the first toe. An apparent tiny soft tissue calcification is noted at the first toe. No definite radiopaque foreign body is seen. Mild apparent soft tissue swelling is noted about the forefoot, most prominent at the first toe.  IMPRESSION: 1. No evidence of fracture or dislocation. No osseous erosions seen. No radiopaque foreign bodies seen. 2. Apparent pes planus noted.   Electronically Signed   By: Garald Balding M.D.   On: 05/28/2014 21:19     EKG Interpretation None      MDM   Final diagnoses:  None    Edward [name] is a 43 y.o. male here with L big toe ulcer. Concerned for osteo vs cellulitis. Will get labs, xrays. Will give abx.   10:01 PM Xray showed no osteo. WBC nl. Lactate nl. Doesn't appear septic. Patient had R toe infection several months ago and was treated with doxy and followed up with [facility] ortho. Will try doxy again. Will have her f/u with ortho and wound care center.     Wandra Arthurs, MD 05/28/14 2203

## 2014-06-22 ENCOUNTER — Encounter (HOSPITAL_BASED_OUTPATIENT_CLINIC_OR_DEPARTMENT_OTHER): Payer: BC Managed Care – PPO | Attending: General Surgery

## 2015-05-20 ENCOUNTER — Emergency Department (HOSPITAL_COMMUNITY): Payer: BLUE CROSS/BLUE SHIELD

## 2015-05-20 ENCOUNTER — Emergency Department (HOSPITAL_COMMUNITY)
Admission: EM | Admit: 2015-05-20 | Discharge: 2015-05-20 | Disposition: A | Discharge status: Home / Self Care | Payer: BLUE CROSS/BLUE SHIELD | Attending: Emergency Medicine | Admitting: Emergency Medicine

## 2015-05-20 ENCOUNTER — Encounter (HOSPITAL_COMMUNITY): Payer: Self-pay | Admitting: Emergency Medicine

## 2015-05-20 DIAGNOSIS — Z79899 Other long term (current) drug therapy: Secondary | ICD-10-CM | POA: Insufficient documentation

## 2015-05-20 DIAGNOSIS — E119 Type 2 diabetes mellitus without complications: Secondary | ICD-10-CM | POA: Diagnosis not present

## 2015-05-20 DIAGNOSIS — I10 Essential (primary) hypertension: Secondary | ICD-10-CM | POA: Insufficient documentation

## 2015-05-20 DIAGNOSIS — L02612 Cutaneous abscess of left foot: Secondary | ICD-10-CM

## 2015-05-20 DIAGNOSIS — Z720 Tobacco use: Secondary | ICD-10-CM | POA: Diagnosis not present

## 2015-05-20 LAB — CBC WITH DIFFERENTIAL/PLATELET
Basophils Absolute: 0 10*3/uL (ref 0.0–0.1)
Basophils Relative: 0 %
Eosinophils Absolute: 0.2 10*3/uL (ref 0.0–0.7)
Eosinophils Relative: 3 %
HEMATOCRIT: 41.8 % (ref 39.0–52.0)
HEMOGLOBIN: 13.8 g/dL (ref 13.0–17.0)
LYMPHS ABS: 1.7 10*3/uL (ref 0.7–4.0)
Lymphocytes Relative: 22 %
MCH: 28.2 pg (ref 26.0–34.0)
MCHC: 33 g/dL (ref 30.0–36.0)
MCV: 85.5 fL (ref 78.0–100.0)
MONOS PCT: 12 %
Monocytes Absolute: 1 10*3/uL (ref 0.1–1.0)
NEUTROS ABS: 5.2 10*3/uL (ref 1.7–7.7)
Neutrophils Relative %: 63 %
Platelets: 198 10*3/uL (ref 150–400)
RBC: 4.89 MIL/uL (ref 4.22–5.81)
RDW: 12.6 % (ref 11.5–15.5)
WBC: 8.1 10*3/uL (ref 4.0–10.5)

## 2015-05-20 LAB — BASIC METABOLIC PANEL
Anion gap: 10 (ref 5–15)
BUN: 17 mg/dL (ref 6–20)
CHLORIDE: 105 mmol/L (ref 101–111)
CO2: 20 mmol/L — AB (ref 22–32)
Calcium: 8.6 mg/dL — ABNORMAL LOW (ref 8.9–10.3)
Creatinine, Ser: 1.42 mg/dL — ABNORMAL HIGH (ref 0.61–1.24)
GFR calc non Af Amer: 58 mL/min — ABNORMAL LOW (ref >60)
Glucose, Bld: 262 mg/dL — ABNORMAL HIGH (ref 65–99)
Potassium: 4.4 mmol/L (ref 3.5–5.1)
Sodium: 135 mmol/L (ref 135–145)

## 2015-05-20 LAB — CBG MONITORING, ED: Glucose-Capillary: 265 mg/dL — ABNORMAL HIGH (ref 65–99)

## 2015-05-20 MED ORDER — HYDROCODONE-ACETAMINOPHEN 5-325 MG PO TABS: 1.0000 | ORAL_TABLET | Freq: Four times a day (QID) | ORAL | Status: DC | PRN

## 2015-05-20 MED ORDER — SODIUM CHLORIDE 0.9 % IV SOLN
INTRAVENOUS | Status: DC
  Administered 2015-05-20: 08:00:00 via INTRAVENOUS

## 2015-05-20 MED ORDER — SULFAMETHOXAZOLE-TRIMETHOPRIM 800-160 MG PO TABS: 1.0000 | ORAL_TABLET | Freq: Two times a day (BID) | ORAL | Status: AC

## 2015-05-20 MED ORDER — HYDROCODONE-ACETAMINOPHEN 5-325 MG PO TABS
2.0000 | ORAL_TABLET | Freq: Once | ORAL | Status: AC
  Administered 2015-05-20: 2 via ORAL
  Filled 2015-05-20: qty 2

## 2015-05-20 MED ORDER — SITAGLIPTIN PHOS-METFORMIN HCL 50-1000 MG PO TABS: 1.0000 | ORAL_TABLET | Freq: Two times a day (BID) | ORAL | Status: DC

## 2015-05-20 MED ORDER — PIPERACILLIN-TAZOBACTAM 3.375 G IVPB 30 MIN
3.3750 g | Freq: Once | INTRAVENOUS | Status: AC
  Administered 2015-05-20: 3.375 g via INTRAVENOUS
  Filled 2015-05-20: qty 50

## 2015-05-20 MED ORDER — METFORMIN HCL 1000 MG PO TABS: 1000.0000 mg | ORAL_TABLET | Freq: Two times a day (BID) | ORAL | Status: DC

## 2015-05-20 NOTE — Progress Notes (Addendum)
Inpatient Diabetes Program Recommendations  AACE/ADA: New Consensus Statement on Inpatient Glycemic Control (2015)  Target Ranges:  Prepandial:   less than 140 mg/dL      Peak postprandial:   less than 180 mg/dL (1-2 hours)      Critically ill patients:  140 - 180 mg/dL   Review of Glycemic Control  Diabetes history: DM 2 Outpatient Diabetes medications: Janumet (Januvia/Metformin Combo) 50-1000 mg BID Current orders for Inpatient glycemic control: Pending possible admission  Inpatient Diabetes Program Recommendations:  Insulin - Basal: On admission, please consider ordering low dose basal insulin on patient, Lantus 12-16 units Q 24hrs (.15 units/kg is the 16 units). Insulin - Correction: On admission, please consider ordering CBGs and Novolog Sensitive scale TID and HS scale. HgbA1C: Consider ordering an A1c to assess for glucose control over the past 2-3 meds due to him not taking medications for 2 months.  Note: Patient with abscess on left great toe. Pending assessment and plan from ED MD and PA. Consult received due to uncontrolled DM. Will follow patient if admitted. Will write orders for DM education later today if admitted. Patient educational videos: 501-Basic skills for controlling diabetes 0000000 Long Term Complications [id number] Your Diabetes 504-Introduction to Carb Counting 505-Diabetes: Foot Care 506-Diabetes: Insulin Injection 507-Diabetes & Nutrition: Eating for Health 508-Diabetes: Reducing Fears About Injections 509-Diabetes and Heart Disease 510-Monitoring Blood Glucose 511-How to use an insulin pen  Will also order Living well with DM book. May also want to consider ordering Dietitian consult. Patient has insurance and will qualify for Outpatient Education at the DM [facility] when discharged (place order when appropriate). Will speak with patient before discharge once education has been started. Will be able to assess the degree of education  once A1c is drawn and resulted.  RN to teach Survival skills before discharge: s/s hypoglycemia and treatment s/s hyperglycemia and treatment A1c results, goal is 7% or less (150 avg glucose) New medications they will be on Check glucose and how often (1-2 times for oral meds, 3-4 times a day with insulin) Teach how to administer insulin with each insulin administration dose scheduled, if applicable Watching carb intake (45-60 g/females/meal 15 g/snack, 60-75 g/males 15-30 g/snack), plate method, watching beverage options.  Thanks,  Tama Headings RN, MSN, [facility] Inpatient Diabetes Coordinator Team Pager [phone] (8a-5p)

## 2015-05-20 NOTE — ED Notes (Signed)
Pain in left lower leg and left hip starting Thursday. Noticed swelling and bump on left great toe this morning. Soaked it, it drained blood. The three pains are not connected (it doesn't run up the whole leg). Three separate pains in left great toe, left lower shin, and left hip.

## 2015-05-20 NOTE — ED Provider Notes (Signed)
CSN: [id number]     Arrival date & time 05/20/15  0726 History   First MD Initiated Contact with Patient 05/20/15 [phone]     Chief Complaint  Patient presents with  . Leg Pain  . Toe Injury     HPI Comments: Edward [name] is a 44 y.o. Male presents with pain and bleeding to his left great toe and second toe. Pt admits to poorly controlling his NIDDM and that it has been at least a year since he has seen his PCP. States he has not taken his metformin for about two months due to money problems, but says he did not know he could get it for $4. Pt has chronic diabetic peripheral neuropathy. Pt denies any increased thirst, urination, problems with urination, other neurologic disorders, or recent falls.  Pt states he does not remember hitting his foot on anything. Also complains of chronic left hip arthritis pain, but states this is a secondary complaint to his foot wound.   Past Medical History  Diagnosis Date  . Diabetes mellitus   . Hypertension    Past Surgical History  Procedure Laterality Date  . Irrigation and debridement abscess  08/12/2011    Procedure: IRRIGATION AND DEBRIDEMENT ABSCESS;  Surgeon: Molli Hazard, MD;  Location: [place];  Service: Urology;  Laterality: N/A;  irrigation and debridment perineum    . Incision and drainage of wound  08/12/2011    Procedure: IRRIGATION AND DEBRIDEMENT WOUND;  Surgeon: Zenovia Jarred, MD;  Location: [place];  Service: General;;  perineum  . Irrigation and debridement abscess  08/14/2011    Procedure: MINOR INCISION AND DRAINAGE OF ABSCESS;  Surgeon: Molli Hazard, MD;  Location: [place];  Service: Urology;  Laterality: N/A;  Perineal Wound Debridement;Placement Bilateral Testicular Thigh Pouches  . I&d extremity  09/02/2011    Procedure: IRRIGATION AND DEBRIDEMENT EXTREMITY;  Surgeon: Theodoro Kos, DO;  Location: [place];  Service: Plastics;  Laterality: N/A;  . Circumcision  09/02/2011    Procedure: CIRCUMCISION  ADULT;  Surgeon: Molli Hazard, MD;  Location: [place];  Service: Urology;  Laterality: N/A;   History reviewed. No pertinent family history. Social History  Substance Use Topics  . Smoking status: Current Every Day Smoker    Types: Cigarettes  . Smokeless tobacco: Never Used  . Alcohol Use: Yes     Comment: has not had alcohol in a month    Review of Systems  Constitutional: Negative for fever, chills, diaphoresis and unexpected weight change.  Respiratory: Negative for shortness of breath.   Cardiovascular: Negative for chest pain, palpitations and leg swelling.  Gastrointestinal: Negative for nausea, vomiting and diarrhea.  Genitourinary: Negative for dysuria, urgency, hematuria, flank pain and decreased urine volume.  Musculoskeletal: Negative for arthralgias.  Neurological: Negative for dizziness and light-headedness.  All other systems reviewed and are negative.     Allergies  Review of patient's allergies indicates no known allergies.  Home Medications   Prior to Admission medications   Medication Sig Start Date End Date Taking? Authorizing Provider  docusate sodium (COLACE) 100 MG capsule Take 200 mg by mouth daily as needed for mild constipation.   Yes Historical Provider, MD  glucose blood test strip 1 each by Other route See admin instructions. Check blood sugar once daily as needed.    Historical Provider, MD  HYDROcodone-acetaminophen (NORCO/VICODIN) 5-325 MG tablet Take 1-2 tablets by mouth every 6 (six) hours as needed for severe pain. 05/20/15  Lorayne Bender, PA-C  sitaGLIPtin-metformin (JANUMET) 50-1000 MG per tablet Take 1 tablet by mouth 2 (two) times daily with a meal. 11/03/13   [name], MD  sulfamethoxazole-trimethoprim (BACTRIM DS,SEPTRA DS) 800-160 MG tablet Take 1 tablet by mouth 2 (two) times daily. 05/20/15 05/30/15  Shawn C Joy, PA-C   BP 130/78 mmHg  Pulse 70  Temp(Src) 98.5 F (36.9 C) (Oral)  Resp 18  Ht 6\' 1"   (1.854 m)  Wt 240 lb (108.863 kg)  BMI 31.67 kg/m2  SpO2 99% Physical Exam  Constitutional: He is oriented to person, place, and time. He appears well-developed and well-nourished. No distress.  HENT:  Head: Normocephalic and atraumatic.  Eyes: Conjunctivae and EOM are normal. Pupils are equal, round, and reactive to light.  Cardiovascular: Normal rate, regular rhythm and normal heart sounds.   Pulmonary/Chest: Effort normal and breath sounds normal. No respiratory distress.  Abdominal: Soft. Bowel sounds are normal.  Musculoskeletal: Normal range of motion. He exhibits no edema or tenderness.  Neurological: He is alert and oriented to person, place, and time.  Skin: Skin is warm and dry. He is not diaphoretic.     Redness and swelling to left foot, dorsum and medial aspect. Abscess noted just below the toe nail on great toe. Upon exam, abscess was able to be drained through a break in the skin near the toe nail. Pt has a healed ulcer on the plantar aspect of the foot.  Nursing note and vitals reviewed.   ED Course  Procedures (including critical care time) Labs Review Labs Reviewed  BASIC METABOLIC PANEL - Abnormal; Notable for the following:    CO2 20 (*)    Glucose, Bld 262 (*)    Creatinine, Ser 1.42 (*)    Calcium 8.6 (*)    GFR calc non Af Amer 58 (*)    All other components within normal limits  CBG MONITORING, ED - Abnormal; Notable for the following:    Glucose-Capillary 265 (*)    All other components within normal limits  WOUND CULTURE  CBC WITH DIFFERENTIAL/PLATELET    Imaging Review Dg Foot Complete Left  05/20/2015   CLINICAL DATA:  Ulcer on the LEFT great toe.  EXAM: LEFT FOOT - COMPLETE 3+ VIEW  COMPARISON:  05/28/2014  FINDINGS: Hilus valgus deformity of the first ray. There is an cutaneous ulceration along the medial surface of the great toe. No subcutaneous gas is evident. No osseous erosion.  No fracture or dislocation of mid foot or forefoot. The  phalanges are normal. The calcaneus is normal. No soft tissue abnormality.  IMPRESSION: Soft tissue ulceration along the medial aspect of the great toe without evidence of osteomyelitis.   Electronically Signed   By: Suzy Bouchard M.D.   On: 05/20/2015 09:16   I have personally reviewed and evaluated these images and lab results as part of my medical decision-making.   EKG Interpretation None      MDM   Final diagnoses:  Foot abscess, left    Lavi Nitka presents with an abscess on his left great toe with purulent, bloody discharge.  Findings and plan of care discussed with Dr. Sabra Heck.  Xray to check for osteomyelitis or any other associated abnormalities. Zosyn and consult to diabetes care team as well as wound care team.  Xray of foot shows no evidence of osteomyelitis.  Pain control in ED.  Will discharge with Bactrim prescription with followup with wound care within two days. Pt to call wound  care center to set up appt. Referral to podiatry.     Lorayne Bender, PA-C 05/20/15 0947  Pt given prescriptions for his diabetes medications as well.  His Janumet, which is what he is prescribed, but pt stated it was too expensive, and metformin, which would serve as a much less expensive alternative.  Pt called and told that the prescriptions would be available for pickup at the front desk of the [facility] ED.  Lorayne Bender, PA-C 05/20/15 [place], MD 05/23/15 [phone]

## 2015-05-20 NOTE — ED Notes (Signed)
Patient transported to X-ray 

## 2015-05-20 NOTE — ED Provider Notes (Signed)
The patient is a 44 year old male, he is not compliant with his medications, it is been approximately 2 months since he has been on diabetic medications and has noticed increased swelling and drainage from his left foot surrounding the  Left great toe. There has been some bloody drainage, swelling and redness to the foot. On exam the patient does have a swollen foot mostly on the dorsum and medial aspect of the foot, there is bloody purulent drainage from the distal toe at the edge of the nail, no obvious paronychia but very soft, full vesicular type purulent drainage.   He does not appear ill otherwise, check labs, evaluate for hyperglycemic dysfunction, image the foot for air containing infection as he has had 48 gangrene in the past , counseled patient regarding medication complaints.  Medical screening examination/treatment/procedure(s) were conducted as a shared visit with non-physician practitioner(s) and myself.  I personally evaluated the patient during the encounter.  Clinical Impression:   Final diagnoses:  Foot abscess, left         Noemi Chapel, MD 05/23/15 [phone]

## 2015-05-20 NOTE — ED Notes (Signed)
Patient returned from X-ray 

## 2015-05-20 NOTE — Discharge Instructions (Signed)
You have been diagnosed with a foot abscess complicated by your diabetes. Please call the wound care center to set up an appt: [phone]. Followup with your PCP within a week. Recommend obtaining a podiatrist for future foot care.    Abscess An abscess is an infected area that contains a collection of pus and debris.It can occur in almost any part of the body. An abscess is also known as a furuncle or boil. CAUSES  An abscess occurs when tissue gets infected. This can occur from blockage of oil or sweat glands, infection of hair follicles, or a minor injury to the skin. As the body tries to fight the infection, pus collects in the area and creates pressure under the skin. This pressure causes pain. People with weakened immune systems have difficulty fighting infections and get certain abscesses more often.  SYMPTOMS Usually an abscess develops on the skin and becomes a painful mass that is red, warm, and tender. If the abscess forms under the skin, you may feel a moveable soft area under the skin. Some abscesses break open (rupture) on their own, but most will continue to get worse without care. The infection can spread deeper into the body and eventually into the bloodstream, causing you to feel ill.  DIAGNOSIS  Your caregiver will take your medical history and perform a physical exam. A sample of fluid may also be taken from the abscess to determine what is causing your infection. TREATMENT  Your caregiver may prescribe antibiotic medicines to fight the infection. However, taking antibiotics alone usually does not cure an abscess. Your caregiver may need to make a small cut (incision) in the abscess to drain the pus. In some cases, gauze is packed into the abscess to reduce pain and to continue draining the area. HOME CARE INSTRUCTIONS   Only take over-the-counter or prescription medicines for pain, discomfort, or fever as directed by your caregiver.  If you were prescribed antibiotics, take  them as directed. Finish them even if you start to feel better.  If gauze is used, follow your caregiver's directions for changing the gauze.  To avoid spreading the infection:  Keep your draining abscess covered with a bandage.  Wash your hands well.  Do not share personal care items, towels, or whirlpools with others.  Avoid skin contact with others.  Keep your skin and clothes clean around the abscess.  Keep all follow-up appointments as directed by your caregiver. SEEK MEDICAL CARE IF:   You have increased pain, swelling, redness, fluid drainage, or bleeding.  You have muscle aches, chills, or a general ill feeling.  You have a fever. MAKE SURE YOU:   Understand these instructions.  Will watch your condition.  Will get help right away if you are not doing well or get worse. Document Released: 05/15/2005 Document Revised: 02/04/2012 Document Reviewed: 10/18/2011 [facility] Patient Information 2015 [place], [place]. This information is not intended to replace advice given to you by your health care provider. Make sure you discuss any questions you have with your health care provider.  Abscess Care After An abscess (also called a boil or furuncle) is an infected area that contains a collection of pus. Signs and symptoms of an abscess include pain, tenderness, redness, or hardness, or you may feel a moveable soft area under your skin. An abscess can occur anywhere in the body. The infection may spread to surrounding tissues causing cellulitis. A cut (incision) by the surgeon was made over your abscess and the pus was drained  out. Gauze may have been packed into the space to provide a drain that will allow the cavity to heal from the inside outwards. The boil may be painful for 5 to 7 days. Most people with a boil do not have high fevers. Your abscess, if seen early, may not have localized, and may not have been lanced. If not, another appointment may be required for this if it does  not get better on its own or with medications. HOME CARE INSTRUCTIONS   Only take over-the-counter or prescription medicines for pain, discomfort, or fever as directed by your caregiver.  When you bathe, soak and then remove gauze or iodoform packs at least daily or as directed by your caregiver. You may then wash the wound gently with mild soapy water. Repack with gauze or do as your caregiver directs. SEEK IMMEDIATE MEDICAL CARE IF:   You develop increased pain, swelling, redness, drainage, or bleeding in the wound site.  You develop signs of generalized infection including muscle aches, chills, fever, or a general ill feeling.  An oral temperature above 102 F (38.9 C) develops, not controlled by medication. See your caregiver for a recheck if you develop any of the symptoms described above. If medications (antibiotics) were prescribed, take them as directed. Document Released: 02/21/2005 Document Revised: 10/28/2011 Document Reviewed: 10/19/2007 [facility] Patient Information 2015 [place], [place]. This information is not intended to replace advice given to you by your health care provider. Make sure you discuss any questions you have with your health care provider.

## 2015-05-23 LAB — WOUND CULTURE

## 2015-05-26 ENCOUNTER — Ambulatory Visit (INDEPENDENT_AMBULATORY_CARE_PROVIDER_SITE_OTHER): Payer: BLUE CROSS/BLUE SHIELD | Admitting: Podiatry

## 2015-05-26 ENCOUNTER — Ambulatory Visit: Payer: BLUE CROSS/BLUE SHIELD | Admitting: Podiatry

## 2015-05-26 ENCOUNTER — Encounter: Payer: Self-pay | Admitting: Podiatry

## 2015-05-26 VITALS — BP 129/77 | HR 75 | Resp 14

## 2015-05-26 DIAGNOSIS — L97523 Non-pressure chronic ulcer of other part of left foot with necrosis of muscle: Secondary | ICD-10-CM | POA: Diagnosis not present

## 2015-05-26 DIAGNOSIS — L03116 Cellulitis of left lower limb: Secondary | ICD-10-CM | POA: Diagnosis not present

## 2015-05-26 MED ORDER — CIPROFLOXACIN HCL 500 MG PO TABS: 500.0000 mg | ORAL_TABLET | Freq: Two times a day (BID) | ORAL | Status: DC

## 2015-05-26 MED ORDER — CLINDAMYCIN HCL 300 MG PO CAPS: 300.0000 mg | ORAL_CAPSULE | Freq: Three times a day (TID) | ORAL | Status: DC

## 2015-05-26 MED ORDER — HYDROCODONE-ACETAMINOPHEN 5-325 MG PO TABS: 1.0000 | ORAL_TABLET | Freq: Four times a day (QID) | ORAL | Status: DC | PRN

## 2015-05-26 NOTE — Progress Notes (Signed)
   Subjective:    Patient ID: Edward [name], male    DOB: [birth year], 44 y.o.   MRN: [id number]  HPI  44 year old male presents the office if concerns of an ulcer to the left big toe which is been ongoing the last several weeks. He went to the ER last Saturday he is prescribed Bactrim. He states the toe is swollen Nadara Mustard has decreased some since starting antibiotics. He states he said a wound on and off the last year however over the last week it has worsened. He continues to wear a regular shoe. He is still taken antibiotics. He denies any systemic complaints such as fevers, chills, nausea, vomiting. He denies any calf pain, chest pain, shortness of breath.   Review of Systems  All other systems reviewed and are negative.      Objective:   Physical Exam AAO 3, NAD DP/PT pulses palpable, CRT less than 3 seconds Protective sensation decreased with Simms Weinstein monofilament On the medial aspect left hallux or does appear to be hyperkeratotic lesion. There is significant epidermalysis along the hallux. There appears to be a previous bulla along the entire distal aspect of the hallux. Upon debridement, There was an ulceration on the medial aspect of the hallux measuring 1.5 x 1 cm with a fibro-granular wound base. The wound does not probe to bone although it is very close. There is no exposed bone at this time. There is macerated periwound. There is edema to the hallux. There is no drainage or purulence expressed. There is an increase in warmth to the foot however there is no specific erythema. There is no ascending cellulitis. No areas of fluctuance or crepitus. No other open lesions or pre-ulcerative lesions identified this time. No other areas of tenderness to bilateral lower extremities. No pain with calf compression, swelling, warmth, erythema.    Assessment & Plan:   44 year old male with left hallux ulceration, infection -Previous x-rays were reviewed from the hospital last  week. -Treatment options discussed including all alternatives, risks, and complications -All the epidermal lysis area was debrided to reveal underlying ulceration. The wound was lightly debrided. Iodosorb was applied followed by dry sterile dressing. Continue daily dressing changes with antibiotic ointment for now. -We'll change from Bactrim to clindamycin/ciprofloxacin. -Discussed with him that he is a high likelihood of amputation given the looks the toe today.  -Dispensed surgical shoe.  -Monitor for any clinical signs or symptoms of infection and directed to call the office immediately should any occur or go to the ER. -Follow-up in 1 week or sooner if any problems arise. In the meantime, encouraged to call the office with any questions, concerns, change in symptoms.  *X-ray next appointment   Celesta Gentile, DPM

## 2015-05-29 ENCOUNTER — Encounter: Payer: Self-pay | Admitting: *Deleted

## 2015-05-29 ENCOUNTER — Telehealth: Payer: Self-pay | Admitting: *Deleted

## 2015-05-29 ENCOUNTER — Encounter: Payer: Self-pay | Admitting: Podiatry

## 2015-05-29 NOTE — Telephone Encounter (Addendum)
Pt's mtr, states pt has itching bumps after taking the antibiotics and did not get the sleeve that Dr. Jacqualyn Posey had suggested.  I spoke with Pt's mtr, Edward [name] she states pt said the antibiotics are no longer causing the itchy rash, and he would like to continue them.  Dr. Jacqualyn Posey states that is fine.  Edward [name] asked how long would Dr. Jacqualyn Posey want the pt to be off work.  Dr. Jacqualyn Posey said until the toe heals, but write for 1 month.  I informed Edward [name], and told her the letter could be picked up in the front office [name].  Pt's mtr, states the sleeve was to go over the surgical shoe so pt could work.  I told her we did have a cap that fit the surgical shoe that pts often worked in.

## 2015-05-29 NOTE — Telephone Encounter (Signed)
Continue clinda and stop cipro to see if that helps. I am not sure what sleeve they are talking about.

## 2015-05-30 DIAGNOSIS — L03116 Cellulitis of left lower limb: Secondary | ICD-10-CM

## 2015-06-05 ENCOUNTER — Encounter: Payer: Self-pay | Admitting: Podiatry

## 2015-06-05 ENCOUNTER — Ambulatory Visit (INDEPENDENT_AMBULATORY_CARE_PROVIDER_SITE_OTHER): Payer: BLUE CROSS/BLUE SHIELD | Admitting: Podiatry

## 2015-06-05 ENCOUNTER — Telehealth: Payer: Self-pay | Admitting: *Deleted

## 2015-06-05 VITALS — BP 100/54 | HR 57 | Resp 18

## 2015-06-05 DIAGNOSIS — L97522 Non-pressure chronic ulcer of other part of left foot with fat layer exposed: Secondary | ICD-10-CM

## 2015-06-05 DIAGNOSIS — L89891 Pressure ulcer of other site, stage 1: Secondary | ICD-10-CM | POA: Diagnosis not present

## 2015-06-05 NOTE — Telephone Encounter (Signed)
Dr. Jacqualyn Posey ordered Collagen with Silver dressing for daily changes of left plantar hallux ulcer 1.4x0.8x0.1 with low exudate, dx [id number], faxed to [facility].

## 2015-06-06 ENCOUNTER — Encounter: Payer: Self-pay | Admitting: Podiatry

## 2015-06-06 NOTE — Progress Notes (Signed)
Patient ID: Edward [name], male   DOB: [birth year], 44 y.o.   MRN: [id number]  Subjective: 44 year old male presents the office they for follow-up evaluation left hallux ulceration. He states he has noticed some bloody drainage but denies any pus. He has been continued antibiotic ointment dressing changes daily. He believes the toe is looking better than it was last appointment. Has continue the antibiotics. He continues to her regular shoe. Denies any systemic complaints such as fevers, chills, nausea, vomiting. No calf pain, chest pain, shortness of breath. No other complaints at this time. No acute changes otherwise.  Objective: AAO x 3, NAD DP/PT pulses 2/4, CRT less than 3 seconds Protective sensation decreased with Simms Weinstein monofilament On the medial aspect of the hallux there is an annular ulceration measuring approximate 1.2 x 1 cm with a granular wound base. Some of hyperkeratotic tissue. Is a small amount of epidermal lysis remaining however not as significant as last appointment. The wound overall has a much better appearance compared to last appointment. There is no surrounding macerated tissue. There is mild edema to the hallux. There is no surrounding erythema, ascending cellulitis, fluctuance, crepitation. No probing, undermining, tunneling. There is no malodor. There is no drainage or purulence. No other open lesions or pre-ulcerative lesions. No pain with calf compression, swelling, warmth, erythema.  Assessment: 44 year old male with healing ulceration left hallux  Plan: -Treatment options discussed including all alternatives, risks, and complications -Wound was debrided to healthy, bleeding, granular wound base. Silvadene was applied followed by dry sterile dressing. Ordered collagen with silver dressings. Continue daily dressing changes. -Dispensed surgical shoe. -Monitor for any clinical signs or symptoms of infection and directed to call the office immediately should any  occur or go to the ER. -Follow-up in 2 weeks or sooner if any problems arise. In the meantime, encouraged to call the office with any questions, concerns, change in symptoms.   Celesta Gentile, DPM

## 2015-06-19 ENCOUNTER — Ambulatory Visit (INDEPENDENT_AMBULATORY_CARE_PROVIDER_SITE_OTHER): Payer: BLUE CROSS/BLUE SHIELD | Admitting: Podiatry

## 2015-06-19 ENCOUNTER — Encounter: Payer: Self-pay | Admitting: Podiatry

## 2015-06-19 VITALS — BP 148/97 | HR 81 | Resp 18

## 2015-06-19 DIAGNOSIS — L97522 Non-pressure chronic ulcer of other part of left foot with fat layer exposed: Secondary | ICD-10-CM

## 2015-06-19 DIAGNOSIS — L89891 Pressure ulcer of other site, stage 1: Secondary | ICD-10-CM

## 2015-06-20 ENCOUNTER — Encounter: Payer: Self-pay | Admitting: Podiatry

## 2015-06-20 NOTE — Progress Notes (Signed)
Patient ID: Edward [name], male   DOB: [birth year], 44 y.o.   MRN: [id number]  Subjective: 44 year old male presents the office they for follow-up evaluation left hallux ulceration. He is to have some body drainage however he denies any pus from the area. He denies any surrounding redness or red streaks. Denies any malodor. He's been continuing with daily dressing changes with antibiotic ointment. He has not received the dressings which I ordered last appointment. Denies any systemic complaints such as fevers, chills, nausea, vomiting. No calf pain, chest pain, shortness of breath. No other complaints at this time. No acute changes otherwise.  Objective: AAO x 3, NAD DP/PT pulses 2/4, CRT less than 3 seconds Protective sensation decreased with Simms Weinstein monofilament On the medial aspect of the hallux there is an annular ulceration measuring approximate 1 x 0.8 cm with a granular wound base after debridement. The periwound is hyperkeratotic. There is no significant epidermal lysis around the wound and there is no surrounding macerated tissue. There is no swelling erythema, ascending saline was. No fluctuance or crepitus. No malodor. There is no probing, undermining, tunneling.  No other open lesions or pre-ulcerative lesions. No pain with calf compression, swelling, warmth, erythema.  Assessment: 44 year old male with healing ulceration left hallux; healing  Plan: -Treatment options discussed including all alternatives, risks, and complications -Wound was debrided to healthy, bleeding, granular wound base. Silvadene was applied followed by dry sterile dressing. continue with daily dressing changes. We contacted the company in regards to the dressings. Once he gets the sequence of the collagen silver dressing changes. -Continue with surgical shoe. -Monitor for any clinical signs or symptoms of infection and directed to call the office immediately should any occur or go to the ER. -Due to the  significant this wound I recommend him to stay out of work until the wound is healed. I will go ahead and complete paperwork for him to be out for up to 4 more weeks. If the wound closes in the meantime, he can return sooner.  -Follow-up in 2 weeks or sooner if any problems arise. In the meantime, encouraged to call the office with any questions, concerns, change in symptoms.   Celesta Gentile, DPM

## 2015-07-03 ENCOUNTER — Ambulatory Visit (INDEPENDENT_AMBULATORY_CARE_PROVIDER_SITE_OTHER): Payer: BLUE CROSS/BLUE SHIELD | Admitting: Podiatry

## 2015-07-03 ENCOUNTER — Encounter: Payer: Self-pay | Admitting: Podiatry

## 2015-07-03 ENCOUNTER — Ambulatory Visit (INDEPENDENT_AMBULATORY_CARE_PROVIDER_SITE_OTHER): Payer: BLUE CROSS/BLUE SHIELD

## 2015-07-03 VITALS — BP 148/104 | HR 71 | Temp 96.8°F | Resp 14

## 2015-07-03 DIAGNOSIS — L97522 Non-pressure chronic ulcer of other part of left foot with fat layer exposed: Secondary | ICD-10-CM

## 2015-07-03 MED ORDER — SILVER SULFADIAZINE 1 % EX CREA: 1.0000 "application " | TOPICAL_CREAM | Freq: Every day | CUTANEOUS | Status: DC

## 2015-07-03 NOTE — Progress Notes (Signed)
Patient ID: Edward [name], male   DOB: 06-Mar-1971, 44 y.o.   MRN: [id number]   Subjective: 44 year old male presents the office they for follow-up evaluation left hallux ulceration. He states that overall he is doing better and the wound continues to improve. He denies any drainage or pus. Denies any swelling redness or red streaks. He does state that he has had some darkened discoloration since this one has decreased his left big toe. He has been continuing with antibiotic ointment overlying the area as the dressings were not approved by his insurance apparently. He continues with the surgical shoe. He is requesting go back to work at this time. Denies any systemic complaints such as fevers, chills, nausea, vomiting. No calf pain, chest pain, shortness of breath. No other complaints at this time. No acute changes otherwise.  Objective: AAO x 3, NAD DP/PT pulses 2/4, CRT less than 3 seconds Protective sensation decreased with Simms Weinstein monofilament On the medial aspect of the hallux there is an annular ulceration measuring approximate 0.8 x 0.5 cm with a granular wound base after debridement. There is no probing, undermining, tunneling. Prior to debridement there is a hyperkeratotic periwound. There is decreased edema to the hallux and there is some hyperpigmentation to the hallux although this is likely result of chronic inflammation. There is no surrounding erythema, ascending cellulitis, fluctuance, crepitus, malodor, drainage/purulence. No other open lesions or pre-ulcerative lesions identified bilaterally.  No pain with calf compression, swelling, warmth, erythema.  Assessment: 44 year old male with healing ulceration left hallux; healing  Plan: -Treatment options discussed including all alternatives, risks, and complications -Wound was debrided to healthy, bleeding, granular wound base. Silvadene was applied followed by dry sterile dressing. continue with daily dressing changes. Continue  with silvadene dressing changes. This was sent to his pharmacy.  -Continue with surgical shoe. -I recommended the patient stand or work until the wound heals. Although he states that he have to go back to work. Although I recommended against this he has to do this a states. I discussed with the changes the dressing twice a day offload the area. Offloading pads were dispensed. There is any increase in the wound size with is a prominence to call the office immediately. -Paperwork is completed today for diabetic shoe precertification. -Monitor for any clinical signs or symptoms of infection and directed to call the office immediately should any occur or go to the ER. -Follow-up in 2 weeks or sooner if any problems arise. In the meantime, encouraged to call the office with any questions, concerns, change in symptoms.   Celesta Gentile, DPM

## 2015-07-03 NOTE — Patient Instructions (Signed)
Monitor for any signs/symptoms of infection. Call the office immediately if any occur or go directly to the emergency room. Call with any questions/concerns.  

## 2015-07-11 ENCOUNTER — Telehealth: Payer: Self-pay | Admitting: Podiatry

## 2015-07-11 NOTE — Telephone Encounter (Signed)
pts mother called and stated the insurance company states they got the return to work letter but did not get the medical records. Please call pts mom.

## 2015-07-17 ENCOUNTER — Encounter: Payer: Self-pay | Admitting: Podiatry

## 2015-07-17 ENCOUNTER — Ambulatory Visit (INDEPENDENT_AMBULATORY_CARE_PROVIDER_SITE_OTHER): Payer: BLUE CROSS/BLUE SHIELD | Admitting: Podiatry

## 2015-07-17 VITALS — BP 161/104 | HR 89 | Resp 18

## 2015-07-17 DIAGNOSIS — L89891 Pressure ulcer of other site, stage 1: Secondary | ICD-10-CM

## 2015-07-17 DIAGNOSIS — L97521 Non-pressure chronic ulcer of other part of left foot limited to breakdown of skin: Secondary | ICD-10-CM

## 2015-07-17 DIAGNOSIS — L97529 Non-pressure chronic ulcer of other part of left foot with unspecified severity: Secondary | ICD-10-CM | POA: Insufficient documentation

## 2015-07-17 NOTE — Patient Instructions (Signed)
Monitor for any signs/symptoms of infection. Call the office immediately if any occur or go directly to the emergency room. Call with any questions/concerns.  Have a good week. Please call me with any issues.

## 2015-07-17 NOTE — Progress Notes (Signed)
Patient ID: Edward [name], male   DOB: Dec 02, 1970, 44 y.o.   MRN: [id number]  Subjective: 44 year old male presents the office they for follow-up evaluation left hallux ulceration. He states he is doing well he has not noticed any signs of infection including redness, red streaks, drainage, purulence or malodor. He continues to change the dressing daily with over-the-counter antibiotic ointment and a dressing. He does not wear dressing at times. He does continue with surgical shoe. He states that he have to go back to work tomorrow and he cannot afford to take any more, work. Denies any systemic complaints such as fevers, chills, nausea, vomiting. No calf pain, chest pain, shortness of breath. No other complaints at this time. No acute changes otherwise.  Objective: AAO x 3, NAD DP/PT pulses 2/4, CRT less than 3 seconds Protective sensation decreased with Simms Weinstein monofilament On the medial aspect of the hallux there is an annular ulceration measuring approximatl the same size ofy  0.8 x 0.5 cm with a granular wound base after debridement.  there is some more hyperkeratotic tissue within normal. After debridement of all of hyperkeratotic tissue the wound is about the same size although did appear to be healthier as it was more granular base and after debridement was bleeding tissue. There is no probing, undermining, tunneling.There is decreased edema to the hallux and there is some hyperpigmentation to the hallux although this is likely result of chronic inflammation although he this is improving. There is no surrounding erythema, ascending cellulitis, fluctuance, crepitus, malodor, drainage/purulence. No other open lesions or pre-ulcerative lesions identified bilaterally. No pain with calf compression, swelling, warmth, erythema.  Assessment: 44 year old male with healing ulceration left hallux; healing  Plan: -Treatment options discussed including all alternatives, risks, and  complications -Wound was debrided to healthy, bleeding, granular wound base. Silvadene was applied followed by dry sterile dressing. continue with daily dressing changes. Continue with dressing changes. He is been continue with OTC ointment due to cost. -Continue with surgical shoe. -I continue to recommend him stay to work although he cannot do this. Discussed that if he goes back to work views were offloading pads to help protect this area. After work is clean the wound with antibiotic ointment and keep wound clean and dry. He may achieve the bandage while at work as well. If there is any increase in size of the wound or any signs of infection he needs to hold off on 1 work. -Awaiting diabetic shoe precertification. -Monitor for any clinical signs or symptoms of infection and directed to call the office immediately should any occur or go to the ER. -Follow-up in 2 weeks or sooner if any problems arise. In the meantime, encouraged to call the office with any questions, concerns, change in symptoms.   Celesta Gentile, DPM

## 2015-07-31 ENCOUNTER — Ambulatory Visit: Payer: BLUE CROSS/BLUE SHIELD | Admitting: Podiatry

## 2015-08-28 ENCOUNTER — Ambulatory Visit (INDEPENDENT_AMBULATORY_CARE_PROVIDER_SITE_OTHER): Payer: BLUE CROSS/BLUE SHIELD | Admitting: Podiatry

## 2015-08-28 ENCOUNTER — Encounter: Payer: Self-pay | Admitting: Podiatry

## 2015-08-28 ENCOUNTER — Ambulatory Visit (INDEPENDENT_AMBULATORY_CARE_PROVIDER_SITE_OTHER): Payer: BLUE CROSS/BLUE SHIELD

## 2015-08-28 VITALS — BP 129/99 | HR 94 | Resp 14

## 2015-08-28 DIAGNOSIS — L89891 Pressure ulcer of other site, stage 1: Secondary | ICD-10-CM | POA: Diagnosis not present

## 2015-08-28 DIAGNOSIS — L97521 Non-pressure chronic ulcer of other part of left foot limited to breakdown of skin: Secondary | ICD-10-CM | POA: Diagnosis not present

## 2015-08-28 DIAGNOSIS — L97522 Non-pressure chronic ulcer of other part of left foot with fat layer exposed: Secondary | ICD-10-CM

## 2015-08-28 MED ORDER — AMOXICILLIN-POT CLAVULANATE 875-125 MG PO TABS: 1.0000 | ORAL_TABLET | Freq: Two times a day (BID) | ORAL | Status: DC

## 2015-08-29 ENCOUNTER — Encounter: Payer: Self-pay | Admitting: Podiatry

## 2015-08-29 NOTE — Progress Notes (Signed)
Patient ID: Edward [name], male   DOB: May 27, 1971, 45 y.o.   MRN: [id number]  Subjective: 45 year old male presents the office they for follow-up evaluation left hallux ulceration. Andun fortunately missed his last appointment. He states this one he did start him some bloody drainage coming from the wound of his left big toe. He denies any redness or red streaks any swelling. He is curling not taken antibiotics. Denies any systemic complaints such as fevers, chills, nausea, vomiting. No calf pain, chest pain, shortness of breath. No other complaints at this time. No acute changes otherwise.  Objective: AAO x 3, NAD; wearing a regular shoe DP/PT pulses 2/4, CRT less than 3 seconds Protective sensation decreased with Simms Weinstein monofilament On the medial aspect of the hallux there is an annular ulceration measuring  0.6 x 0.5 x 0.5 cm with a granular wound base after debridement. There is quite a bit of hyperkeratotic tissue overlying the wound prior to debridement. The wound does appear to be deeper compared to what it was previously. There is mild malodor. There is no drainage or pus. No swelling erythema, ascending cellulitis, fluctuance, crepitus, malodor, drainage. HAV present No other open lesions or pre-ulcerative lesions identified bilaterally. No pain with calf compression, swelling, warmth, erythema.  Assessment: 45 year old male with healing ulceration left hallux  Plan: -X-rays were obtained and reviewed with the patient.  -Treatment options discussed including all alternatives, risks, and complications -Wound was debrided to healthy, bleeding, granular wound base. Silvadene was applied followed by dry sterile dressing. continue with daily dressing changes. Continue with dressing changes.  -Return to surgical shoe. -Discussed with him this is a limb threatening wound. -Rx Augmentin -Wearing diabetic shoes approval. -Monitor for any clinical signs or symptoms of infection and  directed to call the office immediately should any occur or go to the ER. -Follow-up in 2 weeks or sooner if any problems arise. In the meantime, encouraged to call the office with any questions, concerns, change in symptoms.   Celesta Gentile, DPM

## 2015-09-18 ENCOUNTER — Encounter: Payer: Self-pay | Admitting: Podiatry

## 2015-09-18 ENCOUNTER — Ambulatory Visit (INDEPENDENT_AMBULATORY_CARE_PROVIDER_SITE_OTHER): Payer: BLUE CROSS/BLUE SHIELD | Admitting: Podiatry

## 2015-09-18 VITALS — BP 127/83 | HR 89 | Resp 18

## 2015-09-18 DIAGNOSIS — M79676 Pain in unspecified toe(s): Secondary | ICD-10-CM

## 2015-09-18 DIAGNOSIS — M2040 Other hammer toe(s) (acquired), unspecified foot: Secondary | ICD-10-CM

## 2015-09-18 DIAGNOSIS — B351 Tinea unguium: Secondary | ICD-10-CM

## 2015-09-18 DIAGNOSIS — E1149 Type 2 diabetes mellitus with other diabetic neurological complication: Secondary | ICD-10-CM

## 2015-09-18 DIAGNOSIS — L89891 Pressure ulcer of other site, stage 1: Secondary | ICD-10-CM

## 2015-09-18 DIAGNOSIS — L97521 Non-pressure chronic ulcer of other part of left foot limited to breakdown of skin: Secondary | ICD-10-CM

## 2015-09-18 DIAGNOSIS — M2010 Hallux valgus (acquired), unspecified foot: Secondary | ICD-10-CM

## 2015-09-19 ENCOUNTER — Encounter: Payer: Self-pay | Admitting: Podiatry

## 2015-09-19 NOTE — Progress Notes (Signed)
Patient ID: Edward [name], male   DOB: [birth year], 45 y.o.   MRN: [id number]   Subjective: 45 year old male presents the office they for follow-up evaluation left hallux ulceration.  He states he is finish the course of antibiotics and is doing better. He does continue to work quite a bit on his feet. He denies any drainage or possibly around the wound denies any surrounding redness or red streaks or increase in warmth or swelling. He has a states his nails are elongated and thick and cannot trim them himself and is asking for debridement. No swelling erythema or drainage. He is also presents today for diabetic shoe measurement. Denies any systemic complaints such as fevers, chills, nausea, vomiting. No calf pain, chest pain, shortness of breath. No other complaints at this time. No acute changes otherwise.  Objective: AAO x 3, NAD; wearing a regular shoe DP/PT pulses 2/4, CRT less than 3 seconds Protective sensation decreased with Simms Weinstein monofilament On the medial aspect of the hallux there is an annular ulceration measuring  0.4 x 0.2 x 0.2 cm with a granular wound base after debridement. There is quite a bit of hyperkeratotic tissue overlying the wound prior to debridement.  The wound appears be improved compared to previous appointment. There is no drainage or pus. No swelling erythema , ascending saline disc, fluctuance, crepitus, malodor, drainage or purulence. Nails are hypertrophic, dystrophic, brittle, discolored, elongated 10. No swelling erythema or drainage. Tenderness to nails 1-5 bilaterally. HAV present No other open lesions or pre-ulcerative lesions identified bilaterally. No pain with calf compression, swelling, warmth, erythema.  Assessment: 45 year old male with healing ulceration left hallux  Plan: -Treatment options discussed including all alternatives, risks, and complications -Wound was debrided to healthy, bleeding, granular wound base. Silvadene was applied  followed by dry sterile dressing. continue with daily dressing changes. Continue with dressing changes.  -Recommend surgical shoe but he continues to wear a regular shoe. Offloading pads dispensed -He was measures today for diabetic shoes. Given his deformity with history of ulceration and neuropathy recommend diabetic shoes to help prevent further issues. -Nails debrided 10 without complications or bleeding. -Monitor for any clinical signs or symptoms of infection and directed to call the office immediately should any occur or go to the ER. -Follow-up in 2 weeks or sooner if any problems arise. In the meantime, encouraged to call the office with any questions, concerns, change in symptoms.   Celesta Gentile, DPM

## 2015-10-09 ENCOUNTER — Ambulatory Visit (INDEPENDENT_AMBULATORY_CARE_PROVIDER_SITE_OTHER): Payer: BLUE CROSS/BLUE SHIELD | Admitting: Podiatry

## 2015-10-09 ENCOUNTER — Encounter: Payer: Self-pay | Admitting: Podiatry

## 2015-10-09 VITALS — BP 108/76 | HR 95 | Resp 18

## 2015-10-09 DIAGNOSIS — L97521 Non-pressure chronic ulcer of other part of left foot limited to breakdown of skin: Secondary | ICD-10-CM

## 2015-10-09 DIAGNOSIS — M2040 Other hammer toe(s) (acquired), unspecified foot: Secondary | ICD-10-CM

## 2015-10-09 DIAGNOSIS — E1149 Type 2 diabetes mellitus with other diabetic neurological complication: Secondary | ICD-10-CM | POA: Diagnosis not present

## 2015-10-09 DIAGNOSIS — M2010 Hallux valgus (acquired), unspecified foot: Secondary | ICD-10-CM | POA: Diagnosis not present

## 2015-10-10 NOTE — Progress Notes (Signed)
Patient ID: Edward [name], male   DOB: 1971-05-06, 45 y.o.   MRN: [id number]   Subjective: 45 year old male presents the office they for follow-up evaluation left hallux ulceration. He states he did not this course of antibiotics he did stop and buttocks early. He denies any drainage or pus coming from the area and denies any redness or drainage. She states the wound is healing well. He also presented take of diabetic shoes. Denies any systemic complaints such as fevers, chills, nausea, vomiting. No calf pain, chest pain, shortness of breath. No other complaints at this time. No acute changes otherwise.  Objective: AAO x 3, NAD; wearing a regular shoe DP/PT pulses 2/4, CRT less than 3 seconds Protective sensation decreased with Simms Weinstein monofilament On the medial aspect of the hallux there is an annular hyperkeratotic lesion measuring  0.4 x 0.2 cm. after debridement the underlying wound appears to be healed there is no drainage or pus. There is no swelling erythema, ascending saline's. There is hyperkeratotic tissue to the medial aspect of the hallux. No other open lesions or pre-ulcerative lesions identified. There is no clinical signs of infection. No swelling or warmth of the foot. HAV present No other open lesions or pre-ulcerative lesions identified bilaterally. No pain with calf compression, swelling, warmth, erythema.  Assessment: 45 year old male with healing ulceration left hallux; PUDS  Plan: -Treatment options discussed including all alternatives, risks, and complications -Lesion was debrided and underlying ulceration appears to be healed. Monitor for any further skin breakdown. -Today diabetic shoes were dispensed. He tried that she is on major to be fitting well any high-pressure areas. Monitor for any skin breakdown. -Monitor for any clinical signs or symptoms of infection and directed to call the office immediately should any occur or go to the ER. -Follow-up in 4 weeks or  sooner if any problems arise. In the meantime, encouraged to call the office with any questions, concerns, change in symptoms.   Celesta Gentile, DPM

## 2015-11-06 ENCOUNTER — Encounter: Payer: Self-pay | Admitting: Podiatry

## 2015-11-06 ENCOUNTER — Ambulatory Visit (INDEPENDENT_AMBULATORY_CARE_PROVIDER_SITE_OTHER): Payer: BLUE CROSS/BLUE SHIELD | Admitting: Podiatry

## 2015-11-06 VITALS — BP 155/99 | HR 93 | Resp 18

## 2015-11-06 DIAGNOSIS — L89891 Pressure ulcer of other site, stage 1: Secondary | ICD-10-CM

## 2015-11-06 DIAGNOSIS — L97521 Non-pressure chronic ulcer of other part of left foot limited to breakdown of skin: Secondary | ICD-10-CM

## 2015-11-07 ENCOUNTER — Encounter: Payer: Self-pay | Admitting: Podiatry

## 2015-11-07 NOTE — Progress Notes (Signed)
Patient ID: Edward [name], male   DOB: [birth year], 45 y.o.   MRN: [id number]  Subjective: 45 year old male presents the office they for follow-up evaluation left hallux ulceration. He states that since last when he's been using moisturizers soaking his feet daily. He still the wound is Somewhat Worse to His Left Side. He Denies Any Drainage or Pus. Denies Any Swelling or Redness. No Warmth to His Foot. He Has Been Continue the Diabetic Shoes All Is Not Wearing Them Today. No Other Complaints at This Time. Denies any systemic complaints such as fevers, chills, nausea, vomiting. No calf pain, chest pain, shortness of breath. No acute changes otherwise.  Objective: AAO x 3, NAD; wearing a regular shoe DP/PT pulses 2/4, CRT less than 3 seconds Protective sensation decreased with Simms Weinstein monofilament On the medial aspect of the hallux there is an annular hyperkeratotic lesion with epidermal lysis. Today after debridement the wound measured about the same size measuring  0.4 x 0.2 cm. there is no drainage or pus. There is small amount of macerated tissue. There is no surrounding erythema, ascending cellulitis, fluctuance, crepitus, malodor, drainage or pus. No other open lesions or pre-ulcerative lesions. No pain with calf compression, swelling, warmth, erythema.  Assessment: 45 year old male with ulceration left hallux  Plan: -Treatment options discussed including all alternatives, risks, and complications -Lesion was debrided. Epidermal lysis is also debrided. There is macerated tissue. Continues, and in her buttock ointment drug the on the wound daily. Hold off on any moisturizing he do not soak the feet. Offloading pads. -Monitor for any clinical signs or symptoms of infection and directed to call the office immediately should any occur or go to the ER. -Follow-up as scheduled or sooner if any problems arise. In the meantime, encouraged to call the office with any questions, concerns, change  in symptoms.   Celesta Gentile, DPM

## 2015-11-20 ENCOUNTER — Ambulatory Visit (INDEPENDENT_AMBULATORY_CARE_PROVIDER_SITE_OTHER): Payer: BLUE CROSS/BLUE SHIELD | Admitting: Podiatry

## 2015-11-20 ENCOUNTER — Encounter: Payer: Self-pay | Admitting: Podiatry

## 2015-11-20 DIAGNOSIS — E1149 Type 2 diabetes mellitus with other diabetic neurological complication: Secondary | ICD-10-CM

## 2015-11-20 DIAGNOSIS — L84 Corns and callosities: Secondary | ICD-10-CM

## 2015-11-21 NOTE — Progress Notes (Signed)
Patient ID: Edward [name], male   DOB: [birth year], 45 y.o.   MRN: [id number]  Subjective: 45 year old male presents the office they for follow-up evaluation left hallux ulceration. He states the wound is doing well he has not had any drainage or pus recently. Denies any swelling or warmth the foot. He has continue with diabetic shoes which is been doing well. Denies any systemic complaints such as fevers, chills, nausea, vomiting. No calf pain, chest pain, shortness of breath. No acute changes otherwise.  Objective: AAO x 3, NAD; wearing a regular shoe DP/PT pulses 2/4, CRT less than 3 seconds Protective sensation decreased with Simms Weinstein monofilament On the medial aspect of the hallux there is an annular hyperkeratotic lesion. Upon debridement the wound appears to be healed there is no underlying ulceration identified at this time. There is no edema, erythema, warmth to the foot. There is no surrounding erythema, ascending cellulitis. No fluctuance or crepitus. No malodor. HAV present No other open lesions or pre-ulcerative lesions. No pain with calf compression, swelling, warmth, erythema.  Assessment: 45 year old male with ulceration left hallux which appears to be healed  Plan: -Treatment options discussed including all alternatives, risks, and complications -Hyperkeratotic lesion debrided without complications or bleeding. Underlying ulceration appears to be healed. Continue to monitor daily for any further skin breakdown. -I'll see him back in 6 weeks to ensure that the area remains healed. At that point we'll go to a three-month routine care appointment. -Monitor for any clinical signs or symptoms of infection and directed to call the office immediately should any occur or go to the ER. -Follow-up as scheduled or sooner if any problems arise. In the meantime, encouraged to call the office with any questions, concerns, change in symptoms.   Celesta Gentile, DPM

## 2016-01-01 ENCOUNTER — Encounter: Payer: Self-pay | Admitting: Podiatry

## 2016-01-01 ENCOUNTER — Ambulatory Visit (INDEPENDENT_AMBULATORY_CARE_PROVIDER_SITE_OTHER): Payer: BLUE CROSS/BLUE SHIELD | Admitting: Podiatry

## 2016-01-01 DIAGNOSIS — B351 Tinea unguium: Secondary | ICD-10-CM | POA: Diagnosis not present

## 2016-01-01 DIAGNOSIS — M79676 Pain in unspecified toe(s): Secondary | ICD-10-CM

## 2016-01-01 DIAGNOSIS — E1149 Type 2 diabetes mellitus with other diabetic neurological complication: Secondary | ICD-10-CM | POA: Diagnosis not present

## 2016-01-01 DIAGNOSIS — L84 Corns and callosities: Secondary | ICD-10-CM | POA: Diagnosis not present

## 2016-01-01 NOTE — Progress Notes (Signed)
Patient ID: Edward [name], male   DOB: [birth year], 45 y.o.   MRN: [id number]   Subjective: 45 year old male presents the office they for follow-up evaluation left hallux pre-ulcerative callus as well as for painful, elongated toenails which are causing irritation in shoe gear. He states that the calluses can thinking given we has not noticed any open sores any drainage. His nails are painful pressure in shoe gear. No redness or drainage. No other complaints.Denies any systemic complaints such as fevers, chills, nausea, vomiting. No calf pain, chest pain, shortness of breath. No acute changes otherwise.  Objective: AAO x 3, NAD; wearing a regular shoe DP/PT pulses 2/4, CRT less than 3 seconds Protective sensation decreased with Simms Weinstein monofilament On the medial aspect of the hallux there is an annular hyperkeratotic lesion. Upon debridement the wound continues to appear healed and there is no underlying ulceration identified at this time. There is no edema, erythema, warmth to the foot. There is no surrounding erythema, ascending cellulitis. No fluctuance or crepitus. No malodor. Nails are hypertrophic, dystrophic, brittle, discolored, elongated 10. No swelling redness or drainage. Tenderness nails 1-5 bilaterally. HAV present No other open lesions or pre-ulcerative lesions. No pain with calf compression, swelling, warmth, erythema.  Assessment: 45 year old male with pre-ulcerative lesion left first MPJ with symptomatic onychomycosis  Plan: -Treatment options discussed including all alternatives, risks, and complications -Hyperkeratotic lesion debrided without complications or bleeding. Continue to monitor for any further skin breakdown. -Nails debrided 10 without complications or bleeding -Daily foot inspection -Follow-up in 2 months or sooner if any issues are to arise. Help with appointment we will go to a every 3 month appointment.  Celesta Gentile, DPM

## 2016-02-26 ENCOUNTER — Ambulatory Visit: Payer: BLUE CROSS/BLUE SHIELD | Admitting: Podiatry

## 2016-03-18 ENCOUNTER — Encounter: Payer: Self-pay | Admitting: Podiatry

## 2016-03-18 ENCOUNTER — Ambulatory Visit (INDEPENDENT_AMBULATORY_CARE_PROVIDER_SITE_OTHER): Payer: BLUE CROSS/BLUE SHIELD | Admitting: Podiatry

## 2016-03-18 VITALS — BP 111/72 | HR 100 | Resp 12

## 2016-03-18 DIAGNOSIS — E1149 Type 2 diabetes mellitus with other diabetic neurological complication: Secondary | ICD-10-CM

## 2016-03-18 DIAGNOSIS — E11621 Type 2 diabetes mellitus with foot ulcer: Secondary | ICD-10-CM

## 2016-03-18 DIAGNOSIS — L97529 Non-pressure chronic ulcer of other part of left foot with unspecified severity: Secondary | ICD-10-CM

## 2016-03-18 MED ORDER — AMOXICILLIN-POT CLAVULANATE 875-125 MG PO TABS: 1.0000 | ORAL_TABLET | Freq: Two times a day (BID) | ORAL | 0 refills | Status: DC

## 2016-03-20 ENCOUNTER — Telehealth: Payer: Self-pay

## 2016-03-20 NOTE — Telephone Encounter (Signed)
LVM returning patients call regarding pads for his feet.

## 2016-03-21 ENCOUNTER — Telehealth: Payer: Self-pay | Admitting: *Deleted

## 2016-03-21 NOTE — Telephone Encounter (Addendum)
Pt presents to office states, Dr. Jacqualyn Posey had wanted him to be out of work starting 03/18/2016, but pt wanted to work and was not able to work in the [employer].  Pt states he worked Monday and Tuesday, and Tuesday after work there was blood in the boot.  Pt states he now knows he can't work with the left foot ulcer and would like a note to give to work to be out of work.  I wrote a note stating pt was to be out of work 03/21/2016 and until reevaluated 04/01/2016.  Pt states Dr.Wagoner had also discussed giving him pads, but he didn't see them, so he can't pick them from our display. I told pt I would ask Dr. Jacqualyn Posey and call 03/22/2016. 03/22/2016-Left message informing pt Dr. Jacqualyn Posey had instructed me on the uses of pads and tube foam for pt, and to call to set time to pickup. 04/15/2016-Faxed [employer] form, pt clinicals and demographics. 05/09/2016-Pt's Mtr, Ezzard Flax states pt is in [facility] and is to have surgery to remove the toes of his left foot, the surgeons said there was too much infection. I told Marva I would inform Dr. Jacqualyn Posey. Informed pt of Dr. Leigh Aurora statement, and she thanked me.

## 2016-03-21 NOTE — Telephone Encounter (Signed)
Tube foam and metatarsal pads

## 2016-03-24 NOTE — Progress Notes (Signed)
Patient ID: Edward [name], male   DOB: 1971-02-13, 45 y.o.   MRN: [id number]   Subjective: 45 year old male presents the office today for a new wound on the bottom of his left foot as well as for thick skin along the big toe. He has noticed a blister to the bottom of his left foot but is unsure problems his been ongoing for period denies any drainage or pus or any swelling or redness or warmth to his foot. Last about any foreign object. No other concerns at this time. Denies any systemic complaints such as fevers, chills, nausea, vomiting. No calf pain, chest pain, shortness of breath. No acute changes otherwise.  Objective: AAO x 3, NAD; wearing a regular shoe DP/PT pulses 2/4, CRT less than 3 seconds Protective sensation decreased with Simms Weinstein monofilament On the medial aspect of the hallux there is an annular hyperkeratotic lesion moderate there is no significant underlying ulceration. No drainage or pus. No swelling erythema, ascending synovitis complexes, crepitus, malodor. On the plantar aspect of the left foot submetatarsal 3 area is what appears to be an blister with no fluid currently within the blister. Upon reviewing there is underlying granular ulceration measuring approximate 1.5 x 1.5 cm. This is superficial any probing, undermining or tunneling. There is no swelling erythema, ascending cellulitis, fluctuance, crepitus, malodor, drainage or pus. No other open lesions or pre-ulcerative lesions identified this time.Marland Kitchen  No pain with calf compression, swelling, warmth, erythema.  Assessment: 45 year old male with pre-ulcerative lesion left hallux, new ulceration submetatarsal   Plan: -Treatment options discussed including all alternatives, risks, and complications -Hyperkeratotic/. She lesion left talus was debrided without complications or bleeding. -Bulla, ulceration was debrided to granular wound base left foot to metatarsal. Recommended antibody ointment and a bandage to the  area daily. Monitor for signs or symptoms of infection. Offloading pads. Recommended surgical shoe however he declined. -Augmentin -Follow-up as scheduled or sooner if any issues are to arise.  Celesta Gentile, DPM

## 2016-04-01 ENCOUNTER — Encounter: Payer: Self-pay | Admitting: Podiatry

## 2016-04-01 ENCOUNTER — Ambulatory Visit (INDEPENDENT_AMBULATORY_CARE_PROVIDER_SITE_OTHER): Payer: BLUE CROSS/BLUE SHIELD | Admitting: Podiatry

## 2016-04-01 DIAGNOSIS — E1149 Type 2 diabetes mellitus with other diabetic neurological complication: Secondary | ICD-10-CM | POA: Diagnosis not present

## 2016-04-01 DIAGNOSIS — L84 Corns and callosities: Secondary | ICD-10-CM

## 2016-04-01 DIAGNOSIS — L97529 Non-pressure chronic ulcer of other part of left foot with unspecified severity: Secondary | ICD-10-CM

## 2016-04-01 DIAGNOSIS — E11621 Type 2 diabetes mellitus with foot ulcer: Secondary | ICD-10-CM

## 2016-04-01 DIAGNOSIS — L89891 Pressure ulcer of other site, stage 1: Secondary | ICD-10-CM

## 2016-04-01 MED ORDER — CEPHALEXIN 500 MG PO CAPS: 500.0000 mg | ORAL_CAPSULE | Freq: Three times a day (TID) | ORAL | 2 refills | Status: DC

## 2016-04-01 NOTE — Progress Notes (Signed)
Patient ID: Edward [name], male   DOB: March 03, 1971, 45 y.o.   MRN: [id number]   Subjective: 45 year old male presents the office today for evaluation await his left foot. He states that he was unable to save the Augmentin as he was having some changes in vision some other issues and he is unsure if this is coming from and a bike 70 discontinued it. He has not gone back to work given the wound on his foot. Denies any drainage or pus. He is unsure how the wound is been doing. He has been changing the dressings daily. No swelling or redness to his foot. No warmth. No other complaints at this time. Denies any systemic complaints such as fevers, chills, nausea, vomiting. No calf pain, chest pain, shortness of breath. No acute changes otherwise.  Objective: AAO x 3, NAD; wearing a regular shoe; with mom present DP/PT pulses 2/4, CRT less than 3 seconds Protective sensation decreased with Simms Weinstein monofilament The medial aspect of the hallux is a hyperkeratotic lesion. This area is pre-ulcerative how there is no definitive open lesion identified at this time. No drainage or pus or sign edema, erythema. No fluctuance or crepitus. On the left foot submetatarsal 3 is an ulceration measuring approximately 1.2 x 1.1 cm. There is no probing, undermining or tunneling. There is no sign edema, erythema, increase in warmth. There is no fluctuance or crepitus. No malodor. Hyperkeratotic periwound. No other open lesions or pre-ulcerative lesions identified at this time. No pain with calf compression, swelling, warmth, erythema.  Assessment: 45 year old male with pre-ulcerative lesion left hallux, ulceration submetatarsal 3  Plan: -Treatment options discussed including all alternatives, risks, and complications -Pre-ulcerative lesion on hallux debrided without complications or bleeding. Small amount of macerated tissue upon debridement. Recommended Betadine dressings to this area daily. -Ulcerations metatarsal  there is debrided to granular healthy wound base. Recommended continue with dressing changes as discussed. -Today in office number to work given the wounds to his foot. -Continue monitoring signs or symptoms of infection and directed to the ER should any occur call the office. -Prescribed Keflex.  Celesta Gentile, DPM

## 2016-04-15 ENCOUNTER — Encounter: Payer: Self-pay | Admitting: Podiatry

## 2016-04-15 ENCOUNTER — Ambulatory Visit (INDEPENDENT_AMBULATORY_CARE_PROVIDER_SITE_OTHER): Payer: BLUE CROSS/BLUE SHIELD | Admitting: Podiatry

## 2016-04-15 DIAGNOSIS — E11621 Type 2 diabetes mellitus with foot ulcer: Secondary | ICD-10-CM

## 2016-04-15 DIAGNOSIS — E1149 Type 2 diabetes mellitus with other diabetic neurological complication: Secondary | ICD-10-CM

## 2016-04-15 DIAGNOSIS — L89891 Pressure ulcer of other site, stage 1: Secondary | ICD-10-CM | POA: Diagnosis not present

## 2016-04-15 DIAGNOSIS — L97529 Non-pressure chronic ulcer of other part of left foot with unspecified severity: Secondary | ICD-10-CM

## 2016-04-15 MED ORDER — COLLAGENASE 250 UNIT/GM EX OINT: 1.0000 "application " | TOPICAL_OINTMENT | Freq: Every day | CUTANEOUS | 0 refills | Status: DC

## 2016-04-15 NOTE — Progress Notes (Signed)
Patient ID: Kendrell Lakey, male   DOB: [birth year], 45 y.o.   MRN: [id number]  Subjective: 45 year old male presents the office today for evaluation await his left foot. He states that the wound has worsened the left foot. He states it does bleed at times with denies any pus. Denies he swelling redness or red streaks. He feels that the wound has gotten worse than the left foot on the ball. He has continue the surgical shoe.  Denies any systemic complaints such as fevers, chills, nausea, vomiting. No calf pain, chest pain, shortness of breath. No acute changes otherwise.  Objective: AAO x 3, NAD; wearing a regular shoe; with mom present DP/PT pulses 2/4, CRT less than 3 seconds Protective sensation decreased with Simms Weinstein monofilament The medial aspect of the hallux is a hyperkeratotic lesion. No underlying ulceration, drainage or other signs of infection today. On the left foot second midtarsal 2/3 is a hyperkeratotic lesion with central ulceration. After debridement to the wound does measure 2 x 1 x 0.4 cm. there is no probing to bone, undermining or tunneling. There is significant hyperkeratotic periwound. Upon debridement there was a granular wound doesn't. There is no drainage or pus. No   surrounding erythema or ascending synovitis. No fluctuance or crepitus there is no malodor. No other open lesions identified. No pain with calf compression, swelling, warmth, erythema.  Assessment: 45 year old male with pre-ulcerative lesion left hallux, ulceration submetatarsal 3  Plan: -Treatment options discussed including all alternatives, risks, and complications -Wound left foot Center metatarsal was sharply debrided to the granular wound base. There is a small amount of fibrotic tissue present in the wound. We will order Santyl given at the wound has a worsening size as well.  -Continue surgical shoe and offloading pads for applied. Monitor for signs or symptoms of infection to the ER should any  occur call the office  -Follow-up in 2 weeks or sooner if needed. Call any questions or concerns.  Celesta Gentile, DPM

## 2016-04-29 ENCOUNTER — Encounter: Payer: Self-pay | Admitting: Podiatry

## 2016-04-29 ENCOUNTER — Ambulatory Visit (INDEPENDENT_AMBULATORY_CARE_PROVIDER_SITE_OTHER): Payer: BLUE CROSS/BLUE SHIELD | Admitting: Podiatry

## 2016-04-29 ENCOUNTER — Ambulatory Visit: Payer: Self-pay

## 2016-04-29 DIAGNOSIS — L97529 Non-pressure chronic ulcer of other part of left foot with unspecified severity: Principal | ICD-10-CM

## 2016-04-29 DIAGNOSIS — E11621 Type 2 diabetes mellitus with foot ulcer: Secondary | ICD-10-CM | POA: Diagnosis not present

## 2016-04-29 DIAGNOSIS — L89891 Pressure ulcer of other site, stage 1: Secondary | ICD-10-CM | POA: Diagnosis not present

## 2016-04-30 MED ORDER — AMOXICILLIN-POT CLAVULANATE 875-125 MG PO TABS: 1.0000 | ORAL_TABLET | Freq: Two times a day (BID) | ORAL | 0 refills | Status: DC

## 2016-04-30 NOTE — Progress Notes (Signed)
Patient ID: Edward [name], male   DOB: 11/23/1970, 45 y.o.   MRN: [id number]  Subjective: 45 year old male presents the office today for evaluation await his left foot. He states that the wound is about the same. He gets some bloody drainage but denies any pus. No surrounding redness or drainage. He has continued with antibiotics. He has remained off of work and he is wearing a surgical shoe with offloading.  Denies any systemic complaints such as fevers, chills, nausea, vomiting. No calf pain, chest pain, shortness of breath. No acute changes otherwise.  Objective: AAO x 3, NAD; wearing a regular shoe; with mom present DP/PT pulses 2/4, CRT less than 3 seconds Protective sensation decreased with Simms Weinstein monofilament The medial aspect of the hallux is a hyperkeratotic lesion. No underlying ulceration, drainage or other signs of infection today. On the left foot second midtarsal 2/3 is a hyperkeratotic lesion with central ulceration. After debridement to the wound does measure 2 x 1.2 x 0.5 cm. there is no probing to bone, undermining or tunneling. This measurement was after debridement. There was a central area of relation tissue which was acting as a plug which was debrided today. The wound was debrided to granular healthy tissue.  No other open lesions identified. No pain with calf compression, swelling, warmth, erythema.  Assessment: 45 year old male with pre-ulcerative lesion left hallux, ulceration submetatarsal 3  Plan: -Treatment options discussed including all alternatives, risks, and complications -Wound was debrided to granular tissue in the central area of hyper granulation tissue is debrided today. The wound does not probe to bone although close. Continue antibiotics. I reordered collagen silver dressings as he did not get a call after I ordered an last appointment. Continues to moderate signs or symptoms of infection to the ER should any occur call the office. Follow-up as  scheduled or sooner if any issues are to arise. Call any questions or concerns meantime. Continue a surgical shoe.  Celesta Gentile, DPM

## 2016-05-03 ENCOUNTER — Telehealth: Payer: Self-pay | Admitting: Podiatry

## 2016-05-03 NOTE — Telephone Encounter (Signed)
[facility] called about pts rx. They are not able to contact pt for delivery. Per Dr Jacqualyn Posey hold off for now and if needed he will call them in a few weeks to order.

## 2016-05-07 ENCOUNTER — Inpatient Hospital Stay (HOSPITAL_COMMUNITY): Payer: BLUE CROSS/BLUE SHIELD

## 2016-05-07 ENCOUNTER — Inpatient Hospital Stay (HOSPITAL_COMMUNITY)
Admission: EM | Admit: 2016-05-07 | Discharge: 2016-05-13 | DRG: 854 | Disposition: A | Discharge status: Home Health | Payer: BLUE CROSS/BLUE SHIELD | Attending: Internal Medicine | Admitting: Internal Medicine

## 2016-05-07 ENCOUNTER — Encounter (HOSPITAL_COMMUNITY): Payer: Self-pay | Admitting: *Deleted

## 2016-05-07 ENCOUNTER — Emergency Department (HOSPITAL_COMMUNITY): Payer: BLUE CROSS/BLUE SHIELD

## 2016-05-07 DIAGNOSIS — L03116 Cellulitis of left lower limb: Secondary | ICD-10-CM | POA: Diagnosis present

## 2016-05-07 DIAGNOSIS — F17210 Nicotine dependence, cigarettes, uncomplicated: Secondary | ICD-10-CM | POA: Diagnosis present

## 2016-05-07 DIAGNOSIS — E1165 Type 2 diabetes mellitus with hyperglycemia: Secondary | ICD-10-CM | POA: Diagnosis present

## 2016-05-07 DIAGNOSIS — N179 Acute kidney failure, unspecified: Secondary | ICD-10-CM | POA: Diagnosis present

## 2016-05-07 DIAGNOSIS — Z833 Family history of diabetes mellitus: Secondary | ICD-10-CM

## 2016-05-07 DIAGNOSIS — Z7984 Long term (current) use of oral hypoglycemic drugs: Secondary | ICD-10-CM | POA: Diagnosis not present

## 2016-05-07 DIAGNOSIS — E871 Hypo-osmolality and hyponatremia: Secondary | ICD-10-CM | POA: Diagnosis present

## 2016-05-07 DIAGNOSIS — L0390 Cellulitis, unspecified: Secondary | ICD-10-CM

## 2016-05-07 DIAGNOSIS — A419 Sepsis, unspecified organism: Principal | ICD-10-CM | POA: Diagnosis present

## 2016-05-07 DIAGNOSIS — R531 Weakness: Secondary | ICD-10-CM | POA: Diagnosis present

## 2016-05-07 DIAGNOSIS — Z89432 Acquired absence of left foot: Secondary | ICD-10-CM | POA: Diagnosis not present

## 2016-05-07 DIAGNOSIS — E1140 Type 2 diabetes mellitus with diabetic neuropathy, unspecified: Secondary | ICD-10-CM | POA: Diagnosis present

## 2016-05-07 DIAGNOSIS — R000 Tachycardia, unspecified: Secondary | ICD-10-CM | POA: Diagnosis present

## 2016-05-07 DIAGNOSIS — E11621 Type 2 diabetes mellitus with foot ulcer: Secondary | ICD-10-CM | POA: Diagnosis present

## 2016-05-07 DIAGNOSIS — M869 Osteomyelitis, unspecified: Secondary | ICD-10-CM | POA: Diagnosis present

## 2016-05-07 DIAGNOSIS — L97529 Non-pressure chronic ulcer of other part of left foot with unspecified severity: Secondary | ICD-10-CM | POA: Diagnosis not present

## 2016-05-07 DIAGNOSIS — Z794 Long term (current) use of insulin: Secondary | ICD-10-CM | POA: Diagnosis present

## 2016-05-07 DIAGNOSIS — E1169 Type 2 diabetes mellitus with other specified complication: Secondary | ICD-10-CM | POA: Diagnosis present

## 2016-05-07 DIAGNOSIS — R2681 Unsteadiness on feet: Secondary | ICD-10-CM

## 2016-05-07 DIAGNOSIS — I10 Essential (primary) hypertension: Secondary | ICD-10-CM | POA: Diagnosis present

## 2016-05-07 DIAGNOSIS — E860 Dehydration: Secondary | ICD-10-CM | POA: Diagnosis present

## 2016-05-07 DIAGNOSIS — Z792 Long term (current) use of antibiotics: Secondary | ICD-10-CM

## 2016-05-07 DIAGNOSIS — N493 Fournier gangrene: Secondary | ICD-10-CM | POA: Diagnosis present

## 2016-05-07 DIAGNOSIS — L97524 Non-pressure chronic ulcer of other part of left foot with necrosis of bone: Secondary | ICD-10-CM | POA: Diagnosis not present

## 2016-05-07 DIAGNOSIS — E118 Type 2 diabetes mellitus with unspecified complications: Secondary | ICD-10-CM

## 2016-05-07 LAB — COMPREHENSIVE METABOLIC PANEL
ALT: 19 U/L (ref 17–63)
AST: 22 U/L (ref 15–41)
Albumin: 3.3 g/dL — ABNORMAL LOW (ref 3.5–5.0)
Alkaline Phosphatase: 69 U/L (ref 38–126)
Anion gap: 14 (ref 5–15)
BILIRUBIN TOTAL: 1.1 mg/dL (ref 0.3–1.2)
BUN: 23 mg/dL — AB (ref 6–20)
CHLORIDE: 96 mmol/L — AB (ref 101–111)
CO2: 19 mmol/L — ABNORMAL LOW (ref 22–32)
CREATININE: 1.7 mg/dL — AB (ref 0.61–1.24)
Calcium: 9.2 mg/dL (ref 8.9–10.3)
GFR, EST AFRICAN AMERICAN: 54 mL/min — AB (ref >60)
GFR, EST NON AFRICAN AMERICAN: 47 mL/min — AB (ref >60)
Glucose, Bld: 339 mg/dL — ABNORMAL HIGH (ref 65–99)
POTASSIUM: 5.1 mmol/L (ref 3.5–5.1)
Sodium: 129 mmol/L — ABNORMAL LOW (ref 135–145)
TOTAL PROTEIN: 7.7 g/dL (ref 6.5–8.1)

## 2016-05-07 LAB — CBG MONITORING, ED
Glucose-Capillary: 335 mg/dL — ABNORMAL HIGH (ref 65–99)
Glucose-Capillary: 359 mg/dL — ABNORMAL HIGH (ref 65–99)

## 2016-05-07 LAB — C-REACTIVE PROTEIN: CRP: 22.6 mg/dL — AB (ref <1.0)

## 2016-05-07 LAB — I-STAT CG4 LACTIC ACID, ED
LACTIC ACID, VENOUS: 3.58 mmol/L — AB (ref 0.5–1.9)
Lactic Acid, Venous: 1.56 mmol/L (ref 0.5–1.9)

## 2016-05-07 LAB — CBC WITH DIFFERENTIAL/PLATELET
BASOS ABS: 0 10*3/uL (ref 0.0–0.1)
BASOS PCT: 0 %
EOS ABS: 0 10*3/uL (ref 0.0–0.7)
Eosinophils Relative: 0 %
HCT: 46.4 % (ref 39.0–52.0)
Hemoglobin: 15.4 g/dL (ref 13.0–17.0)
Lymphocytes Relative: 8 %
Lymphs Abs: 0.9 10*3/uL (ref 0.7–4.0)
MCH: 28 pg (ref 26.0–34.0)
MCHC: 33.2 g/dL (ref 30.0–36.0)
MCV: 84.4 fL (ref 78.0–100.0)
MONO ABS: 0.5 10*3/uL (ref 0.1–1.0)
MONOS PCT: 4 %
NEUTROS ABS: 9.3 10*3/uL — AB (ref 1.7–7.7)
Neutrophils Relative %: 88 %
PLATELETS: 213 10*3/uL (ref 150–400)
RBC: 5.5 MIL/uL (ref 4.22–5.81)
RDW: 12.3 % (ref 11.5–15.5)
WBC: 10.7 10*3/uL — ABNORMAL HIGH (ref 4.0–10.5)

## 2016-05-07 LAB — SEDIMENTATION RATE: SED RATE: 47 mm/h — AB (ref 0–16)

## 2016-05-07 MED ORDER — ONDANSETRON HCL 4 MG/2ML IJ SOLN: 4.0000 mg | Freq: Four times a day (QID) | INTRAMUSCULAR | Status: DC | PRN

## 2016-05-07 MED ORDER — ACETAMINOPHEN 325 MG PO TABS
650.0000 mg | ORAL_TABLET | Freq: Four times a day (QID) | ORAL | Status: DC | PRN
  Administered 2016-05-08: 650 mg via ORAL
  Filled 2016-05-07: qty 2

## 2016-05-07 MED ORDER — ACETAMINOPHEN 325 MG PO TABS
ORAL_TABLET | ORAL | Status: AC
  Filled 2016-05-07: qty 2

## 2016-05-07 MED ORDER — IBUPROFEN 400 MG PO TABS
600.0000 mg | ORAL_TABLET | Freq: Once | ORAL | Status: AC
  Administered 2016-05-07: 600 mg via ORAL
  Filled 2016-05-07: qty 1

## 2016-05-07 MED ORDER — INSULIN ASPART 100 UNIT/ML ~~LOC~~ SOLN
0.0000 [IU] | Freq: Three times a day (TID) | SUBCUTANEOUS | Status: DC
  Administered 2016-05-08: 9 [IU] via SUBCUTANEOUS
  Administered 2016-05-08: 5 [IU] via SUBCUTANEOUS
  Administered 2016-05-08: 7 [IU] via SUBCUTANEOUS
  Administered 2016-05-09 – 2016-05-10 (×3): 5 [IU] via SUBCUTANEOUS
  Administered 2016-05-10: 3 [IU] via SUBCUTANEOUS
  Administered 2016-05-10: 5 [IU] via SUBCUTANEOUS

## 2016-05-07 MED ORDER — SODIUM CHLORIDE 0.9 % IV BOLUS (SEPSIS): 500.0000 mL | Freq: Once | INTRAVENOUS | Status: DC

## 2016-05-07 MED ORDER — INSULIN GLARGINE 100 UNIT/ML ~~LOC~~ SOLN
5.0000 [IU] | Freq: Every day | SUBCUTANEOUS | Status: DC
  Administered 2016-05-07: 5 [IU] via SUBCUTANEOUS
  Filled 2016-05-07: qty 0.05

## 2016-05-07 MED ORDER — VANCOMYCIN HCL 10 G IV SOLR
2000.0000 mg | Freq: Once | INTRAVENOUS | Status: AC
  Administered 2016-05-07: 2000 mg via INTRAVENOUS
  Filled 2016-05-07: qty 2000

## 2016-05-07 MED ORDER — SODIUM CHLORIDE 0.9 % IV BOLUS (SEPSIS)
1000.0000 mL | Freq: Once | INTRAVENOUS | Status: AC
  Administered 2016-05-07: 1000 mL via INTRAVENOUS

## 2016-05-07 MED ORDER — VANCOMYCIN HCL IN DEXTROSE 750-5 MG/150ML-% IV SOLN
750.0000 mg | Freq: Two times a day (BID) | INTRAVENOUS | Status: AC
  Administered 2016-05-08 – 2016-05-12 (×9): 750 mg via INTRAVENOUS
  Filled 2016-05-07 (×15): qty 150

## 2016-05-07 MED ORDER — VANCOMYCIN HCL IN DEXTROSE 1-5 GM/200ML-% IV SOLN: 1000.0000 mg | Freq: Once | INTRAVENOUS | Status: DC

## 2016-05-07 MED ORDER — ACETAMINOPHEN 650 MG RE SUPP: 650.0000 mg | Freq: Four times a day (QID) | RECTAL | Status: DC | PRN

## 2016-05-07 MED ORDER — ONDANSETRON HCL 4 MG PO TABS: 4.0000 mg | ORAL_TABLET | Freq: Four times a day (QID) | ORAL | Status: DC | PRN

## 2016-05-07 MED ORDER — ACETAMINOPHEN 325 MG PO TABS
650.0000 mg | ORAL_TABLET | Freq: Once | ORAL | Status: AC | PRN
  Administered 2016-05-07: 650 mg via ORAL

## 2016-05-07 MED ORDER — SODIUM CHLORIDE 0.9 % IV SOLN
INTRAVENOUS | Status: AC
  Administered 2016-05-07 – 2016-05-08 (×2): via INTRAVENOUS

## 2016-05-07 MED ORDER — PIPERACILLIN-TAZOBACTAM 3.375 G IVPB
3.3750 g | Freq: Three times a day (TID) | INTRAVENOUS | Status: AC
  Administered 2016-05-08 – 2016-05-13 (×15): 3.375 g via INTRAVENOUS
  Filled 2016-05-07 (×19): qty 50

## 2016-05-07 MED ORDER — ONDANSETRON HCL 4 MG/2ML IJ SOLN
4.0000 mg | Freq: Once | INTRAMUSCULAR | Status: AC
  Administered 2016-05-07: 4 mg via INTRAVENOUS
  Filled 2016-05-07: qty 2

## 2016-05-07 MED ORDER — PIPERACILLIN-TAZOBACTAM 3.375 G IVPB 30 MIN
3.3750 g | Freq: Once | INTRAVENOUS | Status: AC
  Administered 2016-05-07: 3.375 g via INTRAVENOUS
  Filled 2016-05-07: qty 50

## 2016-05-07 NOTE — ED Notes (Signed)
Ulcer to left bottom of foot that is 1 cm x 2 cm, with surrounding 5 cm of skin breakdown. Minimal red drainage from ulceration. Foot is warm to touch, swollen when compared to right.

## 2016-05-07 NOTE — ED Notes (Signed)
Pt unable to void at this time. 

## 2016-05-07 NOTE — H&P (Signed)
History and Physical    Edward [name] [id number] DOB: 10/20/1970 DOA: 05/07/2016  PCP: Edward Font, MD  Patient coming from: Home.  Chief Complaint: Not feeling well.  HPI: Edward [name] is a 45 y.o. male with history of diabetes mellitus type 2 with previous history of Fournier's gangrene in 2013 presents to the ER because of not feeling well last few days. Denies any chest pain or shortness of breath nausea vomiting or diarrhea. In the ER patient was found to have fever 104F with tachycardia and elevated lactate. Blood sugar was also elevated. Chest x-ray does not show any infiltrates. Patient has chronic wound on his left foot on the plantar aspect and patient states that the discharge from the left foot wound has recently worsened and his left foot is also swollen more than baseline.   ED Course: Patient was given fluid bolus for sepsis protocol and started on empiric antibiotics for sepsis most likely source could be the left foot. X-ray does not show any definite signs of osteomyelitis.  Review of Systems: As per HPI, rest all negative.   Past Medical History:  Diagnosis Date  . Diabetes mellitus   . Hypertension     Past Surgical History:  Procedure Laterality Date  . CIRCUMCISION  09/02/2011   Procedure: CIRCUMCISION ADULT;  Surgeon: Molli Hazard, MD;  Location: [facility];  Service: Urology;  Laterality: N/A;  . I&D EXTREMITY  09/02/2011   Procedure: IRRIGATION AND DEBRIDEMENT EXTREMITY;  Surgeon: Theodoro Kos, DO;  Location: [facility];  Service: Plastics;  Laterality: N/A;  . INCISION AND DRAINAGE OF WOUND  08/12/2011   Procedure: IRRIGATION AND DEBRIDEMENT WOUND;  Surgeon: Zenovia Jarred, MD;  Location: [facility];  Service: General;;  perineum  . IRRIGATION AND DEBRIDEMENT ABSCESS  08/12/2011   Procedure: IRRIGATION AND DEBRIDEMENT ABSCESS;  Surgeon: Molli Hazard, MD;  Location: [facility];  Service: Urology;  Laterality: N/A;   irrigation and debridment perineum    . IRRIGATION AND DEBRIDEMENT ABSCESS  08/14/2011   Procedure: MINOR INCISION AND DRAINAGE OF ABSCESS;  Surgeon: Molli Hazard, MD;  Location: [facility];  Service: Urology;  Laterality: N/A;  Perineal Wound Debridement;Placement Bilateral Testicular Thigh Pouches     reports that he has been smoking Cigarettes.  He has never used smokeless tobacco. He reports that he drinks alcohol. He reports that he does not use drugs.  No Known Allergies  Family History  Problem Relation Age of Onset  . Diabetes Other     Prior to Admission medications   Medication Sig Start Date End Date Taking? Authorizing Provider  metFORMIN (GLUCOPHAGE) 1000 MG tablet Take 1 tablet (1,000 mg total) by mouth 2 (two) times daily. 05/20/15  Yes Shawn C Joy, PA-C  sitaGLIPtin-metformin (JANUMET) 50-1000 MG per tablet Take 1 tablet by mouth 2 (two) times daily with a meal. 11/03/13  Yes [name], MD  amoxicillin-clavulanate (AUGMENTIN) 875-125 MG tablet Take 1 tablet by mouth 2 (two) times daily. Patient not taking: Reported on 05/07/2016 04/30/16   Trula Slade, DPM  cephALEXin (KEFLEX) 500 MG capsule Take 1 capsule (500 mg total) by mouth 3 (three) times daily. Patient not taking: Reported on 05/07/2016 04/01/16   Trula Slade, DPM  ciprofloxacin (CIPRO) 500 MG tablet Take 1 tablet (500 mg total) by mouth 2 (two) times daily. Patient not taking: Reported on 05/07/2016 05/26/15   Trula Slade, DPM  clindamycin (CLEOCIN) 300 MG capsule Take 1  capsule (300 mg total) by mouth 3 (three) times daily. Patient not taking: Reported on 05/07/2016 05/26/15   Trula Slade, DPM  collagenase (SANTYL) ointment Apply 1 application topically daily. Patient not taking: Reported on 05/07/2016 04/15/16   Trula Slade, DPM  HYDROcodone-acetaminophen (NORCO/VICODIN) 5-325 MG tablet Take 1-2 tablets by mouth every 6 (six) hours as needed for severe pain. Patient not taking:  Reported on 05/07/2016 05/26/15   Trula Slade, DPM  silver sulfADIAZINE (SILVADENE) 1 % cream Apply 1 application topically daily. Patient not taking: Reported on 05/07/2016 07/03/15   Trula Slade, DPM  sitaGLIPtin-metformin (JANUMET) 50-1000 MG tablet Take 1 tablet by mouth 2 (two) times daily with a meal. Patient not taking: Reported on 05/07/2016 05/20/15   Lorayne Bender, PA-C    Physical Exam: Vitals:   05/07/16 1945 05/07/16 1957 05/07/16 2015 05/07/16 2145  BP: 121/68  110/70 122/83  Pulse: 102  97 88  Resp: 20  17 18   Temp:  100.9 F (38.3 C)    TempSrc:  Oral    SpO2: 97%  98% 99%  Weight:      Height:          Constitutional: Not in distress. Vitals:   05/07/16 1945 05/07/16 1957 05/07/16 2015 05/07/16 2145  BP: 121/68  110/70 122/83  Pulse: 102  97 88  Resp: 20  17 18   Temp:  100.9 F (38.3 C)    TempSrc:  Oral    SpO2: 97%  98% 99%  Weight:      Height:       Eyes: Anicteric no pallor. ENMT: No discharge from the ears eyes nose or mouth. Neck: No mass felt. Respiratory: No rhonchi or crepitations. Cardiovascular: S1 and S2 heard. Abdomen: Soft nontender bowel sounds present. Musculoskeletal: Left foot looks swollen. Skin: Plantar aspect of the left foot has an ulcer measuring 2 cm in diameter with no active discharge. Neurologic: Alert awake oriented to time place and person. Moves all extremities. Psychiatric: Appears normal.   Labs on Admission: I have personally reviewed following labs and imaging studies  CBC:  Recent Labs Lab 05/07/16 1609  WBC 10.7*  NEUTROABS 9.3*  HGB 15.4  HCT 46.4  MCV 84.4  PLT 540   Basic Metabolic Panel:  Recent Labs Lab 05/07/16 1609  NA 129*  K 5.1  CL 96*  CO2 19*  GLUCOSE 339*  BUN 23*  CREATININE 1.70*  CALCIUM 9.2   GFR: Estimated Creatinine Clearance: 68.9 mL/min (by C-G formula based on SCr of 1.7 mg/dL (H)). Liver Function Tests:  Recent Labs Lab 05/07/16 1609  AST 22  ALT 19    ALKPHOS 69  BILITOT 1.1  PROT 7.7  ALBUMIN 3.3*   No results for input(s): LIPASE, AMYLASE in the last 168 hours. No results for input(s): AMMONIA in the last 168 hours. Coagulation Profile: No results for input(s): INR, PROTIME in the last 168 hours. Cardiac Enzymes: No results for input(s): CKTOTAL, CKMB, CKMBINDEX, TROPONINI in the last 168 hours. BNP (last 3 results) No results for input(s): PROBNP in the last 8760 hours. HbA1C: No results for input(s): HGBA1C in the last 72 hours. CBG:  Recent Labs Lab 05/07/16 1552  GLUCAP 335*   Lipid Profile: No results for input(s): CHOL, HDL, LDLCALC, TRIG, CHOLHDL, LDLDIRECT in the last 72 hours. Thyroid Function Tests: No results for input(s): TSH, T4TOTAL, FREET4, T3FREE, THYROIDAB in the last 72 hours. Anemia Panel: No results for input(s): VITAMINB12, FOLATE,  FERRITIN, TIBC, IRON, RETICCTPCT in the last 72 hours. Urine analysis:    Component Value Date/Time   COLORURINE YELLOW 08/12/2011 [place] 08/12/2011 1349   LABSPEC 1.020 05/04/2014 1002   PHURINE 5.5 05/04/2014 1002   GLUCOSEU 500 (A) 05/04/2014 1002   HGBUR SMALL (A) 05/04/2014 1002   BILIRUBINUR NEGATIVE 05/04/2014 1002   KETONESUR NEGATIVE 05/04/2014 1002   PROTEINUR 100 (A) 05/04/2014 1002   UROBILINOGEN 0.2 05/04/2014 1002   NITRITE NEGATIVE 05/04/2014 1002   LEUKOCYTESUR NEGATIVE 05/04/2014 1002   Sepsis Labs: @LABRCNTIP (procalcitonin:4,lacticidven:4) )No results found for this or any previous visit (from the past 240 hour(s)).   Radiological Exams on Admission: Dg Chest 2 View  Result Date: 05/07/2016 CLINICAL DATA:  Fever and vomiting today. EXAM: CHEST  2 VIEW COMPARISON:  Chest radiograph July 28, 2011 FINDINGS: Cardiomediastinal silhouette is normal. Mild bronchitic changes. No pleural effusions or focal consolidations. Trachea projects midline and there is no pneumothorax. Soft tissue planes and included osseous structures are  non-suspicious. Mild degenerative change of the thoracic spine. IMPRESSION: Mild bronchitic changes without focal consolidation. Electronically Signed   By: Elon Alas M.D.   On: 05/07/2016 18:57   Dg Foot Complete Left  Result Date: 05/07/2016 CLINICAL DATA:  LEFT foot ulcer.  History of diabetes. EXAM: LEFT FOOT - COMPLETE 3+ VIEW COMPARISON:  LEFT foot radiograph April 29, 2016 FINDINGS: No acute fracture deformity or dislocation. Foreshortened second metatarsus is likely developmental. Pes plana suspected though limited assessment by nonweightbearing radiographs. Soft tissue irregularity medial plantar aspect forefoot with soft tissue swelling, no subcutaneous gas or radiopaque foreign bodies. Mild vascular calcifications. IMPRESSION: Medial plantar skin irregularity consistent with history of ulcer, no subcutaneous gas or radiographic findings of osteomyelitis. Electronically Signed   By: Elon Alas M.D.   On: 05/07/2016 19:01     Assessment/Plan Principal Problem:   Sepsis ([place]) Active Problems:   Type 2 diabetes mellitus with left diabetic foot ulcer ([place])   ARF (acute renal failure) ([place])   Diabetic ulcer of left foot ([place])    1. Sepsis most likely source could be left foot cellulitis versus osteomyelitis - at this time patient is placed on empiric antibiotics for sepsis. Continue with aggressive hydration and follow blood cultures and lactate levels procalcitonin levels. I have ordered left foot MRI to check if there is any abscess or osteomyelitis. Keep left foot elevated. UA still pending. 2. Diabetes mellitus type 2 uncontrolled - patient does not recall his last hemoglobin A1c. Patient is on Janumet. I have held patient's oral hypoglycemics and ordered Lantus insulin 5 units now. May need increased dose. Patient is also placed on sliding scale coverage. Patient's anion gap is mildly elevated. If anion gap continues to remain elevated or blood sugar does not get  controlled will need IV insulin infusion. 3. History of Fournier's gangrene. Denies any groin pain or discharge.   DVT prophylaxis: SCDs. Code Status: Full code.  Family Communication: Discussed with patient.  Disposition Plan: Home.  Consults called: None.  Admission status: Inpatient. Likely stay 2-3 days.    Rise Patience MD [facility] Pager [phone].  If 7PM-7AM, please contact night-coverage [email or web address] Password [email or web address]  05/07/2016, 10:11 PM

## 2016-05-07 NOTE — ED Notes (Signed)
Patient transported to X-ray 

## 2016-05-07 NOTE — ED Triage Notes (Signed)
Pt is here with weakness, chills, dizziness, and severe pain in left ankle. Pt states has a wound ulcers to left foot.

## 2016-05-07 NOTE — ED Notes (Signed)
Report called to [facility].

## 2016-05-07 NOTE — ED Notes (Signed)
Pt sent to MRI with urinal and instructed to provide urine specimen if necessary and bring back to ER

## 2016-05-07 NOTE — ED Notes (Signed)
Pt is aware urine is needed for testing, pt stated he is unable to give a sample at this time. Urinal at bedside.

## 2016-05-07 NOTE — ED Provider Notes (Signed)
I saw and evaluated the patient, reviewed the resident's note and I agree with the findings and plan.   EKG Interpretation None      45 year old male with history of hypertension and diabetes who presents with generalized weakness and dizziness. Feeling unwell today and came to the ED for evaluation. He is febrile, tachycardic on presentation. Normotensive and in no respiratory distress. Code sepsis activated. Source of infection likely diabetic foot ulcer that is infected. There is quarter sized ulcer over the sole of the left foot without active drainage. No upper respiratory symptoms, GI symptoms or urinary symptoms. Patient empirically covered with antibiotics and given weight-based IV fluids. His initial lactate was 3.58 and with leukocytosis of 10.7. He also has hyperglycemia and acute kidney injury. Mild anion gap noted which I suspect is more from his lactate rather than DKA. Discussed with hospitalist service to admit for ongoing management of sepsis.   Forde Dandy, MD 05/07/16 2312

## 2016-05-07 NOTE — ED Notes (Signed)
Attempted report 

## 2016-05-07 NOTE — ED Notes (Signed)
Patient transported to MRI 

## 2016-05-07 NOTE — ED Notes (Signed)
Pt given turkey sandwich and diet sprite 

## 2016-05-07 NOTE — Progress Notes (Signed)
Pharmacy Antibiotic Note  Edward [name] is a 45 y.o. male admitted on 05/07/2016 with weakness, chills, and pain in left ankle.  Pharmacy has been consulted for zosyn and vancomycin dosing for possible cellulitis. Per note, patient has left foot ulcers. Temp 103, WBC 10.7, lactate 3.58.   Plan: Vancomycin 2g IV once in ED  Vancomycin 750 IV every 12 hours.  Goal trough 10-15 mcg/mL. Zosyn 3.375g IV q8h (4 hour infusion).  Monitor renal function, culture results, clinical picture, and vancomycin trough as needed.   Height: 6\' 1"  (185.4 cm) Weight: 230 lb (104.3 kg) IBW/kg (Calculated) : 79.9  Temp (24hrs), Avg:103 F (39.4 C), Min:103 F (39.4 C), Max:103 F (39.4 C)   Recent Labs Lab 05/07/16 1609 05/07/16 1632  WBC 10.7*  --   CREATININE 1.70*  --   LATICACIDVEN  --  3.58*    Estimated Creatinine Clearance: 68.9 mL/min (by C-G formula based on SCr of 1.7 mg/dL (H)).    No Known Allergies  Antimicrobials this admission: 9/18 Vanc >> 9/18 Zosyn >>   Dose adjustments this admission: N/A  Microbiology results: pending   Thank you for allowing pharmacy to be a part of this patient's care.  Argie Ramming, PharmD Pharmacy Resident  Pager [phone] 05/07/16 5:29 PM

## 2016-05-07 NOTE — ED Provider Notes (Signed)
[facility] DEPT Provider Note   CSN: [id number] Arrival date & time: 05/07/16  1537     History   Chief Complaint Chief Complaint  Patient presents with  . Weakness  . Fever    HPI Edward [name] is a 45 y.o. male.  HPI Patient is a 45 year old male with a past medical history of diabetes and hypertension who comes in today complaining of fevers chills nausea vomiting and weakness for the past 3 days.  Patient states that his symptoms began insidiously and have progressed since that time.  Patient also endorses some chest discomfort with deep breathing.  Patient denies shortness of breath.  Patient denies abdominal pain.  Patient additionally has a diabetic ulcer to the MCP the great toe on left foot on the volar side.  Patient states he has had for some time but is not experiencing any pain however this is normal for him as he suffers from numbness secondary to his diabetes below the physical exam: Level of the ankle. Past Medical History:  Diagnosis Date  . Diabetes mellitus   . Hypertension     Patient Active Problem List   Diagnosis Date Noted  . Sepsis ([place]) 05/07/2016  . ARF (acute renal failure) ([place]) 05/07/2016  . Diabetic ulcer of left foot ([place]) 05/07/2016  . Ulcer of toe of left foot ([place]) 07/17/2015  . Fournier gangrene 08/16/2011  . Acute respiratory failure ([place]) 08/16/2011  . Septic shock(785.52) 08/16/2011  . Type 2 diabetes mellitus with left diabetic foot ulcer ([place]) 08/16/2011    Past Surgical History:  Procedure Laterality Date  . CIRCUMCISION  09/02/2011   Procedure: CIRCUMCISION ADULT;  Surgeon: Molli Hazard, MD;  Location: [facility];  Service: Urology;  Laterality: N/A;  . I&D EXTREMITY  09/02/2011   Procedure: IRRIGATION AND DEBRIDEMENT EXTREMITY;  Surgeon: Theodoro Kos, DO;  Location: [facility];  Service: Plastics;  Laterality: N/A;  . INCISION AND DRAINAGE OF WOUND  08/12/2011   Procedure: IRRIGATION AND  DEBRIDEMENT WOUND;  Surgeon: Zenovia Jarred, MD;  Location: [facility];  Service: General;;  perineum  . IRRIGATION AND DEBRIDEMENT ABSCESS  08/12/2011   Procedure: IRRIGATION AND DEBRIDEMENT ABSCESS;  Surgeon: Molli Hazard, MD;  Location: [facility];  Service: Urology;  Laterality: N/A;  irrigation and debridment perineum    . IRRIGATION AND DEBRIDEMENT ABSCESS  08/14/2011   Procedure: MINOR INCISION AND DRAINAGE OF ABSCESS;  Surgeon: Molli Hazard, MD;  Location: [facility];  Service: Urology;  Laterality: N/A;  Perineal Wound Debridement;Placement Bilateral Testicular Thigh Pouches       Home Medications    Prior to Admission medications   Medication Sig Start Date End Date Taking? Authorizing Provider  metFORMIN (GLUCOPHAGE) 1000 MG tablet Take 1 tablet (1,000 mg total) by mouth 2 (two) times daily. 05/20/15  Yes Shawn C Joy, PA-C  sitaGLIPtin-metformin (JANUMET) 50-1000 MG per tablet Take 1 tablet by mouth 2 (two) times daily with a meal. 11/03/13  Yes [name], MD  amoxicillin-clavulanate (AUGMENTIN) 875-125 MG tablet Take 1 tablet by mouth 2 (two) times daily. Patient not taking: Reported on 05/07/2016 04/30/16   Trula Slade, DPM  cephALEXin (KEFLEX) 500 MG capsule Take 1 capsule (500 mg total) by mouth 3 (three) times daily. Patient not taking: Reported on 05/07/2016 04/01/16   Trula Slade, DPM  ciprofloxacin (CIPRO) 500 MG tablet Take 1 tablet (500 mg total) by mouth 2 (two) times daily. Patient not taking: Reported on  05/07/2016 05/26/15   Trula Slade, DPM  clindamycin (CLEOCIN) 300 MG capsule Take 1 capsule (300 mg total) by mouth 3 (three) times daily. Patient not taking: Reported on 05/07/2016 05/26/15   Trula Slade, DPM  collagenase (SANTYL) ointment Apply 1 application topically daily. Patient not taking: Reported on 05/07/2016 04/15/16   Trula Slade, DPM  HYDROcodone-acetaminophen (NORCO/VICODIN) 5-325 MG tablet Take 1-2 tablets by mouth every 6  (six) hours as needed for severe pain. Patient not taking: Reported on 05/07/2016 05/26/15   Trula Slade, DPM  silver sulfADIAZINE (SILVADENE) 1 % cream Apply 1 application topically daily. Patient not taking: Reported on 05/07/2016 07/03/15   Trula Slade, DPM  sitaGLIPtin-metformin (JANUMET) 50-1000 MG tablet Take 1 tablet by mouth 2 (two) times daily with a meal. Patient not taking: Reported on 05/07/2016 05/20/15   Lorayne Bender, PA-C    Family History Family History  Problem Relation Age of Onset  . Diabetes Other     Social History Social History  Substance Use Topics  . Smoking status: Current Every Day Smoker    Types: Cigarettes  . Smokeless tobacco: Never Used  . Alcohol use 0.0 oz/week     Comment: has not had alcohol in a month     Allergies   Review of patient's allergies indicates no known allergies.   Review of Systems Review of Systems  Constitutional: Positive for chills, fatigue and fever.  Respiratory: Negative for chest tightness and shortness of breath.   Cardiovascular: Negative for chest pain and leg swelling.  Gastrointestinal: Negative for abdominal pain.  Genitourinary: Negative for dysuria.  Neurological: Positive for dizziness and light-headedness. Negative for weakness and numbness.  All other systems reviewed and are negative.    Physical Exam Updated Vital Signs BP 115/75   Pulse 85   Temp 100.9 F (38.3 C) (Oral)   Resp 23   Ht 6' 1"  (1.854 m)   Wt 104.3 kg   SpO2 96%   BMI 30.34 kg/m   Physical Exam  Constitutional: He appears well-developed and well-nourished.  HENT:  Head: Normocephalic and atraumatic.  Eyes: Conjunctivae are normal.  Neck: Neck supple.  Cardiovascular: Normal rate and regular rhythm.   No murmur heard. Pulmonary/Chest: Effort normal and breath sounds normal. No respiratory distress.  Abdominal: Soft. There is no tenderness.  Musculoskeletal: He exhibits no edema.  Neurological: He is alert.    Skin: Skin is warm and dry.  Foot ulcer as in MDM  Psychiatric: He has a normal mood and affect.  Nursing note and vitals reviewed.    ED Treatments / Results  Labs (all labs ordered are listed, but only abnormal results are displayed) Labs Reviewed  COMPREHENSIVE METABOLIC PANEL - Abnormal; Notable for the following:       Result Value   Sodium 129 (*)    Chloride 96 (*)    CO2 19 (*)    Glucose, Bld 339 (*)    BUN 23 (*)    Creatinine, Ser 1.70 (*)    Albumin 3.3 (*)    GFR calc non Af Amer 47 (*)    GFR calc Af Amer 54 (*)    All other components within normal limits  CBC WITH DIFFERENTIAL/PLATELET - Abnormal; Notable for the following:    WBC 10.7 (*)    Neutro Abs 9.3 (*)    All other components within normal limits  SEDIMENTATION RATE - Abnormal; Notable for the following:    Sed Rate  47 (*)    All other components within normal limits  C-REACTIVE PROTEIN - Abnormal; Notable for the following:    CRP 22.6 (*)    All other components within normal limits  I-STAT CG4 LACTIC ACID, ED - Abnormal; Notable for the following:    Lactic Acid, Venous 3.58 (*)    All other components within normal limits  CBG MONITORING, ED - Abnormal; Notable for the following:    Glucose-Capillary 335 (*)    All other components within normal limits  URINE CULTURE  CULTURE, BLOOD (ROUTINE X 2)  CULTURE, BLOOD (ROUTINE X 2)  URINALYSIS, ROUTINE W REFLEX MICROSCOPIC (NOT AT [facility])  CBC WITH DIFFERENTIAL/PLATELET  COMPREHENSIVE METABOLIC PANEL  LACTIC ACID, PLASMA  LACTIC ACID, PLASMA  PROCALCITONIN  HEMOGLOBIN A1C  I-STAT CG4 LACTIC ACID, ED    EKG  EKG Interpretation None       Radiology Dg Chest 2 View  Result Date: 05/07/2016 CLINICAL DATA:  Fever and vomiting today. EXAM: CHEST  2 VIEW COMPARISON:  Chest radiograph July 28, 2011 FINDINGS: Cardiomediastinal silhouette is normal. Mild bronchitic changes. No pleural effusions or focal consolidations. Trachea projects  midline and there is no pneumothorax. Soft tissue planes and included osseous structures are non-suspicious. Mild degenerative change of the thoracic spine. IMPRESSION: Mild bronchitic changes without focal consolidation. Electronically Signed   By: Elon Alas M.D.   On: 05/07/2016 18:57   Dg Foot Complete Left  Result Date: 05/07/2016 CLINICAL DATA:  LEFT foot ulcer.  History of diabetes. EXAM: LEFT FOOT - COMPLETE 3+ VIEW COMPARISON:  LEFT foot radiograph April 29, 2016 FINDINGS: No acute fracture deformity or dislocation. Foreshortened second metatarsus is likely developmental. Pes plana suspected though limited assessment by nonweightbearing radiographs. Soft tissue irregularity medial plantar aspect forefoot with soft tissue swelling, no subcutaneous gas or radiopaque foreign bodies. Mild vascular calcifications. IMPRESSION: Medial plantar skin irregularity consistent with history of ulcer, no subcutaneous gas or radiographic findings of osteomyelitis. Electronically Signed   By: Elon Alas M.D.   On: 05/07/2016 19:01    Procedures Procedures (including critical care time)  Medications Ordered in ED Medications  sodium chloride 0.9 % bolus 1,000 mL (0 mLs Intravenous Stopped 05/07/16 1812)    And  sodium chloride 0.9 % bolus 1,000 mL (0 mLs Intravenous Stopped 05/07/16 1953)    And  sodium chloride 0.9 % bolus 1,000 mL (0 mLs Intravenous Stopped 05/07/16 1953)    And  sodium chloride 0.9 % bolus 500 mL (500 mLs Intravenous Rate/Dose Change 05/07/16 1953)  vancomycin (VANCOCIN) IVPB 750 mg/150 ml premix (not administered)  piperacillin-tazobactam (ZOSYN) IVPB 3.375 g (not administered)  acetaminophen (TYLENOL) tablet 650 mg (not administered)    Or  acetaminophen (TYLENOL) suppository 650 mg (not administered)  ondansetron (ZOFRAN) tablet 4 mg (not administered)    Or  ondansetron (ZOFRAN) injection 4 mg (not administered)  insulin aspart (novoLOG) injection 0-9 Units  (not administered)  insulin glargine (LANTUS) injection 5 Units (5 Units Subcutaneous Given 05/07/16 2226)  0.9 %  sodium chloride infusion (not administered)  acetaminophen (TYLENOL) tablet 650 mg (650 mg Oral Given 05/07/16 1550)  piperacillin-tazobactam (ZOSYN) IVPB 3.375 g (0 g Intravenous Stopped 05/07/16 1827)  vancomycin (VANCOCIN) 2,000 mg in sodium chloride 0.9 % 500 mL IVPB (0 mg Intravenous Stopped 05/07/16 2218)  ondansetron (ZOFRAN) injection 4 mg (4 mg Intravenous Given 05/07/16 1759)  ibuprofen (ADVIL,MOTRIN) tablet 600 mg (600 mg Oral Given 05/07/16 1802)     Initial Impression /  Assessment and Plan / ED Course  I have reviewed the triage vital signs and the nursing notes.  Pertinent labs & imaging results that were available during my care of the patient were reviewed by me and considered in my medical decision making (see chart for details).  Clinical Course    Patient is a 45 year old male with a past medical history of diabetes and hypertension who comes in today complaining of fevers chills nausea vomiting and weakness for the past 3 days.  Patient states that his symptoms began insidiously and have progressed since that time.  Patient also endorses some chest discomfort with deep breathing.  Patient denies shortness of breath.  Patient denies abdominal pain.  Patient additionally has a diabetic ulcer to the MCP the great toe on left foot on the volar side.  Patient states he has had for some time but is not experiencing any pain however this is normal for him as he suffers from numbness secondary to his diabetes below the physical exam: Level of the ankle.  Patient clear to auscultation bilaterally normal S1-S2 no rubs murmurs gallops.  Patient is tachycardic and febrile.  Remainder vitals within normal limits.  Patient is ill-appearing.  Abdomen soft nontender.  Examination of the patient's left foot reveals a stage II-III diabetic ulcer on the MCP region of the volar aspect of  the great toe.  There is another likely developing ulcer on the medial portion of the great toe as well.  Remainder physical exam within normal limits.  Patient with labs collected in triage of elevated lactate.  Patient febrile as well to 103.  Will initiate code sepsis and start ABX.  We will additionally collect chest x-ray as well as x-ray of left foot to evaluate for possible osteo-.  Patient's lactate improved with fluid bolus.  Patient elevated ESR and CRP.  X-ray of foot showed no signs concerning for osteo-myelitis.  Patient with multiple derangements including hyponatremia bicarb of 19 and elevated creatinine.  Patient with mild leukocytosis.  Chest x-ray showed no evidence of cardiopulmonary abnormality.  We will admit patient to hospitalist for further care.   Final Clinical Impressions(s) / ED Diagnoses   Final diagnoses:  Cellulitis    New Prescriptions New Prescriptions   No medications on file     Chapman Moss, MD 05/07/16 [place] Liu, MD 05/08/16 [phone]

## 2016-05-08 ENCOUNTER — Telehealth: Payer: Self-pay | Admitting: *Deleted

## 2016-05-08 DIAGNOSIS — M869 Osteomyelitis, unspecified: Secondary | ICD-10-CM

## 2016-05-08 DIAGNOSIS — E11621 Type 2 diabetes mellitus with foot ulcer: Secondary | ICD-10-CM

## 2016-05-08 DIAGNOSIS — E871 Hypo-osmolality and hyponatremia: Secondary | ICD-10-CM

## 2016-05-08 DIAGNOSIS — E1169 Type 2 diabetes mellitus with other specified complication: Secondary | ICD-10-CM

## 2016-05-08 DIAGNOSIS — L97529 Non-pressure chronic ulcer of other part of left foot with unspecified severity: Secondary | ICD-10-CM

## 2016-05-08 LAB — CBC WITH DIFFERENTIAL/PLATELET
BASOS PCT: 0 %
Basophils Absolute: 0 10*3/uL (ref 0.0–0.1)
EOS ABS: 0 10*3/uL (ref 0.0–0.7)
Eosinophils Relative: 0 %
HEMATOCRIT: 37.2 % — AB (ref 39.0–52.0)
Hemoglobin: 12.2 g/dL — ABNORMAL LOW (ref 13.0–17.0)
Lymphocytes Relative: 7 %
Lymphs Abs: 0.8 10*3/uL (ref 0.7–4.0)
MCH: 27.5 pg (ref 26.0–34.0)
MCHC: 32.8 g/dL (ref 30.0–36.0)
MCV: 84 fL (ref 78.0–100.0)
MONO ABS: 1 10*3/uL (ref 0.1–1.0)
MONOS PCT: 8 %
Neutro Abs: 9.7 10*3/uL — ABNORMAL HIGH (ref 1.7–7.7)
Neutrophils Relative %: 84 %
Platelets: 174 10*3/uL (ref 150–400)
RBC: 4.43 MIL/uL (ref 4.22–5.81)
RDW: 12.3 % (ref 11.5–15.5)
WBC: 11.5 10*3/uL — ABNORMAL HIGH (ref 4.0–10.5)

## 2016-05-08 LAB — COMPREHENSIVE METABOLIC PANEL
ALBUMIN: 2.4 g/dL — AB (ref 3.5–5.0)
ALT: 17 U/L (ref 17–63)
ANION GAP: 13 (ref 5–15)
AST: 19 U/L (ref 15–41)
Alkaline Phosphatase: 58 U/L (ref 38–126)
BILIRUBIN TOTAL: 1.2 mg/dL (ref 0.3–1.2)
BUN: 26 mg/dL — ABNORMAL HIGH (ref 6–20)
CO2: 17 mmol/L — ABNORMAL LOW (ref 22–32)
Calcium: 7.9 mg/dL — ABNORMAL LOW (ref 8.9–10.3)
Chloride: 99 mmol/L — ABNORMAL LOW (ref 101–111)
Creatinine, Ser: 1.79 mg/dL — ABNORMAL HIGH (ref 0.61–1.24)
GFR calc non Af Amer: 44 mL/min — ABNORMAL LOW (ref >60)
GFR, EST AFRICAN AMERICAN: 51 mL/min — AB (ref >60)
GLUCOSE: 418 mg/dL — AB (ref 65–99)
POTASSIUM: 4.7 mmol/L (ref 3.5–5.1)
SODIUM: 129 mmol/L — AB (ref 135–145)
TOTAL PROTEIN: 5.8 g/dL — AB (ref 6.5–8.1)

## 2016-05-08 LAB — URINE MICROSCOPIC-ADD ON

## 2016-05-08 LAB — URINALYSIS, ROUTINE W REFLEX MICROSCOPIC
BILIRUBIN URINE: NEGATIVE
Ketones, ur: 15 mg/dL — AB
Leukocytes, UA: NEGATIVE
Nitrite: NEGATIVE
PH: 5 (ref 5.0–8.0)
Protein, ur: 30 mg/dL — AB
SPECIFIC GRAVITY, URINE: 1.028 (ref 1.005–1.030)

## 2016-05-08 LAB — GLUCOSE, CAPILLARY
GLUCOSE-CAPILLARY: 268 mg/dL — AB (ref 65–99)
GLUCOSE-CAPILLARY: 284 mg/dL — AB (ref 65–99)
GLUCOSE-CAPILLARY: 327 mg/dL — AB (ref 65–99)
GLUCOSE-CAPILLARY: 362 mg/dL — AB (ref 65–99)
Glucose-Capillary: 347 mg/dL — ABNORMAL HIGH (ref 65–99)
Glucose-Capillary: 351 mg/dL — ABNORMAL HIGH (ref 65–99)

## 2016-05-08 LAB — LACTIC ACID, PLASMA
LACTIC ACID, VENOUS: 1.2 mmol/L (ref 0.5–1.9)
Lactic Acid, Venous: 1.5 mmol/L (ref 0.5–1.9)

## 2016-05-08 LAB — TROPONIN I: TROPONIN I: 0.03 ng/mL — AB (ref <0.03)

## 2016-05-08 LAB — PROCALCITONIN: PROCALCITONIN: 6.03 ng/mL

## 2016-05-08 MED ORDER — INSULIN GLARGINE 100 UNIT/ML ~~LOC~~ SOLN
10.0000 [IU] | Freq: Once | SUBCUTANEOUS | Status: DC
  Filled 2016-05-08: qty 0.1

## 2016-05-08 MED ORDER — INSULIN GLARGINE 100 UNIT/ML ~~LOC~~ SOLN
10.0000 [IU] | Freq: Every day | SUBCUTANEOUS | Status: DC
  Administered 2016-05-09: 10 [IU] via SUBCUTANEOUS
  Filled 2016-05-08: qty 0.1

## 2016-05-08 MED ORDER — INSULIN GLARGINE 100 UNIT/ML ~~LOC~~ SOLN
10.0000 [IU] | Freq: Once | SUBCUTANEOUS | Status: AC
  Administered 2016-05-08: 10 [IU] via SUBCUTANEOUS
  Filled 2016-05-08: qty 0.1

## 2016-05-08 NOTE — Consult Note (Signed)
ORTHOPAEDIC CONSULTATION  REQUESTING PHYSICIAN: Barton Dubois, MD  Chief Complaint: Left foot ulceration  HPI: Edward [name] is a 45 y.o. male who presents with osteomyelitis with Wagner grade 3 ulcer left foot beneath the third and fourth metatarsal heads. Patient states that he has been seeing Dr. Earleen Newport at the [facility] for serial debridement. Patient presents at this time febrile with a temperature of 104 elevated glucose greater than 400 elevated white cell count with osteomyelitis and ulceration involving the left forefoot.  Past Medical History:  Diagnosis Date  . Diabetes mellitus   . Hypertension    Past Surgical History:  Procedure Laterality Date  . CIRCUMCISION  09/02/2011   Procedure: CIRCUMCISION ADULT;  Surgeon: Molli Hazard, MD;  Location: [facility];  Service: Urology;  Laterality: N/A;  . I&D EXTREMITY  09/02/2011   Procedure: IRRIGATION AND DEBRIDEMENT EXTREMITY;  Surgeon: Theodoro Kos, DO;  Location: [facility];  Service: Plastics;  Laterality: N/A;  . INCISION AND DRAINAGE OF WOUND  08/12/2011   Procedure: IRRIGATION AND DEBRIDEMENT WOUND;  Surgeon: Zenovia Jarred, MD;  Location: [facility];  Service: General;;  perineum  . IRRIGATION AND DEBRIDEMENT ABSCESS  08/12/2011   Procedure: IRRIGATION AND DEBRIDEMENT ABSCESS;  Surgeon: Molli Hazard, MD;  Location: [facility];  Service: Urology;  Laterality: N/A;  irrigation and debridment perineum    . IRRIGATION AND DEBRIDEMENT ABSCESS  08/14/2011   Procedure: MINOR INCISION AND DRAINAGE OF ABSCESS;  Surgeon: Molli Hazard, MD;  Location: [facility];  Service: Urology;  Laterality: N/A;  Perineal Wound Debridement;Placement Bilateral Testicular Thigh Pouches   Social History   Social History  . Marital status: Widowed    Spouse name: N/A  . Number of children: N/A  . Years of education: N/A   Social History Main Topics  . Smoking status: Current Every Day Smoker      Types: Cigarettes  . Smokeless tobacco: Never Used  . Alcohol use 0.0 oz/week     Comment: has not had alcohol in a month  . Drug use: No  . Sexual activity: Not Asked   Other Topics Concern  . None   Social History Narrative  . None   Family History  Problem Relation Age of Onset  . Diabetes Other    - negative except otherwise stated in the family history section No Known Allergies Prior to Admission medications   Medication Sig Start Date End Date Taking? Authorizing Provider  metFORMIN (GLUCOPHAGE) 1000 MG tablet Take 1 tablet (1,000 mg total) by mouth 2 (two) times daily. 05/20/15  Yes Shawn C Joy, PA-C  sitaGLIPtin-metformin (JANUMET) 50-1000 MG per tablet Take 1 tablet by mouth 2 (two) times daily with a meal. 11/03/13  Yes [name], MD  amoxicillin-clavulanate (AUGMENTIN) 875-125 MG tablet Take 1 tablet by mouth 2 (two) times daily. Patient not taking: Reported on 05/07/2016 04/30/16   Trula Slade, DPM  cephALEXin (KEFLEX) 500 MG capsule Take 1 capsule (500 mg total) by mouth 3 (three) times daily. Patient not taking: Reported on 05/07/2016 04/01/16   Trula Slade, DPM  ciprofloxacin (CIPRO) 500 MG tablet Take 1 tablet (500 mg total) by mouth 2 (two) times daily. Patient not taking: Reported on 05/07/2016 05/26/15   Trula Slade, DPM  clindamycin (CLEOCIN) 300 MG capsule Take 1 capsule (300 mg total) by mouth 3 (three) times daily. Patient not taking: Reported on 05/07/2016 05/26/15   Trula Slade,  DPM  collagenase (SANTYL) ointment Apply 1 application topically daily. Patient not taking: Reported on 05/07/2016 04/15/16   Trula Slade, DPM  HYDROcodone-acetaminophen (NORCO/VICODIN) 5-325 MG tablet Take 1-2 tablets by mouth every 6 (six) hours as needed for severe pain. Patient not taking: Reported on 05/07/2016 05/26/15   Trula Slade, DPM  silver sulfADIAZINE (SILVADENE) 1 % cream Apply 1 application topically daily. Patient not taking: Reported  on 05/07/2016 07/03/15   Trula Slade, DPM  sitaGLIPtin-metformin (JANUMET) 50-1000 MG tablet Take 1 tablet by mouth 2 (two) times daily with a meal. Patient not taking: Reported on 05/07/2016 05/20/15   Lorayne Bender, PA-C   Dg Chest 2 View  Result Date: 05/07/2016 CLINICAL DATA:  Fever and vomiting today. EXAM: CHEST  2 VIEW COMPARISON:  Chest radiograph July 28, 2011 FINDINGS: Cardiomediastinal silhouette is normal. Mild bronchitic changes. No pleural effusions or focal consolidations. Trachea projects midline and there is no pneumothorax. Soft tissue planes and included osseous structures are non-suspicious. Mild degenerative change of the thoracic spine. IMPRESSION: Mild bronchitic changes without focal consolidation. Electronically Signed   By: Elon Alas M.D.   On: 05/07/2016 18:57   Mr Foot Left Wo Contrast  Result Date: 05/08/2016 CLINICAL DATA:  Diabetic patient with a chronic ulceration on the plantar surface of the foot deep to the third and fourth toes. Fever and chills for 3 days. EXAM: MRI OF THE LEFT FOOT WITHOUT CONTRAST TECHNIQUE: Multiplanar, multisequence MR imaging was performed. No intravenous contrast was administered. COMPARISON:  Plain films left foot 05/07/2016. FINDINGS: A skin ulceration is seen on the plantar surface of the foot at the level of the third and fourth MTP joints. No focal fluid collection is identified with diffuse subcutaneous edema about the foot noted. Marrow edema is seen throughout the proximal phalanges of the third and fourth toes. Mild marrow edema is seen in the plantar surface of the heads of both the third and fourth metatarsals. Bone marrow signal is otherwise unremarkable. No joint effusion is identified. Increased T2 signal throughout all intrinsic musculature of the foot could be due to myositis but likely reflects changes of early atrophy. IMPRESSION: Large skin ulceration on the plantar surface of the foot at the level the third and  fourth MTP joints. Marrow edema in the plantar aspect of the heads of the third and fourth metatarsals and throughout the proximal phalanges of the third and fourth toes is compatible with osteomyelitis. Negative for abscess or septic joint. Increased T2 signal throughout all intrinsic musculature the foot likely reflects changes of early atrophy rather than myositis. Electronically Signed   By: Inge Rise M.D.   On: 05/08/2016 09:32   Dg Foot Complete Left  Result Date: 05/07/2016 CLINICAL DATA:  LEFT foot ulcer.  History of diabetes. EXAM: LEFT FOOT - COMPLETE 3+ VIEW COMPARISON:  LEFT foot radiograph April 29, 2016 FINDINGS: No acute fracture deformity or dislocation. Foreshortened second metatarsus is likely developmental. Pes plana suspected though limited assessment by nonweightbearing radiographs. Soft tissue irregularity medial plantar aspect forefoot with soft tissue swelling, no subcutaneous gas or radiopaque foreign bodies. Mild vascular calcifications. IMPRESSION: Medial plantar skin irregularity consistent with history of ulcer, no subcutaneous gas or radiographic findings of osteomyelitis. Electronically Signed   By: Elon Alas M.D.   On: 05/07/2016 19:01   - pertinent xrays, CT, MRI studies were reviewed and independently interpreted  Positive ROS: All other systems have been reviewed and were otherwise negative with the exception of  those mentioned in the HPI and as above.  Physical Exam: General: Alert, no acute distress Psychiatric: Patient is competent for consent with normal mood and affect Lymphatic: No axillary or cervical lymphadenopathy Cardiovascular: No pedal edema Respiratory: No cyanosis, no use of accessory musculature GI: No organomegaly, abdomen is soft and non-tender  Skin: Patient has an ulcer beneath the third and fourth metatarsal heads which probes to bone and tendon.   Neurologic: Patient does not have protective sensation bilateral lower  extremities.   MUSCULOSKELETAL:  Examination patient has a good dorsalis pedis pulse he has significant heel cord contracture with dorsiflexion about 10 short of neutral with his knee extended. Patient has an ischemic ulcer over the great toe medial border review of the MRI scan shows osteomyelitis of the third and fourth metatarsal heads clinically patient also has exposed tendon.  Assessment: Assessment: Uncontrolled diabetic insensate neuropathy with Wagner grade 3 ulcer with osteomyelitis of the third and fourth metatarsal heads with heel cord contracture with bacteremia.  Plan: Plan: I recommended proceeding with a transmetatarsal amputation as well as a gastrocnemius recession. Risks and benefits were discussed including risk of the wound not healing risks of sepsis loss of life or limb without surgery. Patient states he understands he states he cannot make a decision at this time. Patient's medical condition was reviewed with the family and all questions were answered.  I will post a transmetatarsal amputation on  the left, with gastrocnemius recession for Friday afternoon patient will make a decision by tomorrow.  Thank you for the consult and the opportunity to see Mr. Aleen Sells, MD [facility] [phone] 5:19 PM

## 2016-05-08 NOTE — Telephone Encounter (Signed)
[facility] asked for pt's zip code and they have not been able to contact pt on home phone, would like another contact phone. [facility]na wanted to know if pt's therapy had changed since last visit 04/29/2016.  I reviewed pt's medication notes and 05/03/2016 Dr. Jacqualyn Posey stated hold off on [facility] at this time. I informed [facility].

## 2016-05-08 NOTE — Progress Notes (Signed)
[facility] PROGRESS NOTE  [name] KTG:[id number] DOB: 1970/10/20 DOA: 05/07/2016 PCP: [name], MD  Interim summary and HPI 45 y.o. male with history of diabetes mellitus type 2 with previous history of Fournier's gangrene in 2013 presents to the ER because of not feeling well last few days. Denies any chest pain or shortness of breath nausea vomiting or diarrhea. In the ER patient was found to have fever 104F with tachycardia and elevated lactate. Blood sugar was also elevated. Chest x-ray does not show any infiltrates. Patient has chronic wound on his left foot on the plantar aspect and patient states that the discharge from the left foot wound has recently worsened and his left foot is also swollen more than baseline.   Assessment/Plan: 1-Sepsis: due to left foot osteomyelitis  -will continue broad spectrum antibiotics -MRI has confirmed osteomyelitis diagnosis  -ortho consulted -will follow rec's -continue supportive care -follow cx data  2-diabetic foot ulcer with osteomyelitis  -as mentioned above -will continue IV antibiotics -patient might need amputation -will follow ortho rec's  3-uncontrolled diabetes -will continue SSI and lantus -continue IVF's  4-HTN -stable currently -will continue current antihypertensive regimen   5-hyponatremia: -due to hyperglycemia -continue insulin treatment and IVF's -will follow up electrolytes trend    Code Status: Full Family Communication: no family at bedside  Disposition Plan: remains inpatient, continue IV antibiotics, will follow orthopedic service recommendations; might need amputation    Consultants:  Dr. Sharol Given (orthopedic service)  Procedures:  See below for x-ray results   Antibiotics:  Vancomycin 9/19   Zosyn 9/19  HPI/Subjective: Positive low grade temp this morning, no acute distress. denies CP and SOB.  Objective: Vitals:   05/08/16 0802 05/08/16 1232  BP: 127/69 135/79  Pulse: 93 100   Resp: 18 20  Temp: 98.9 F (37.2 C) (!) 100.6 F (38.1 C)    Intake/Output Summary (Last 24 hours) at 05/08/16 1403 Last data filed at 05/08/16 1244  Gross per 24 hour  Intake         12139.16 ml  Output             1701 ml  Net         10438.16 ml   Filed Weights   05/07/16 1542 05/08/16 0025  Weight: 104.3 kg (230 lb) 115.3 kg (254 lb 3.2 oz)    Exam:   General:  Low grade temp, in no distress; reports no much of pain on his Left foot.  Cardiovascular: S1 and s2, no rubs, no gallops, no murmrus  Respiratory: good air movement, no wheezing   Abdomen: soft, NT, ND, positive BS  Musculoskeletal: no joint swelling, FROM. Left foot with open wound, slight drainage in the borders, no erythema. Positive numbness and no significant pain (due to diabetic neuropathy)  Data Reviewed: Basic Metabolic Panel:  Recent Labs Lab 05/07/16 1609 05/08/16 0358  NA 129* 129*  K 5.1 4.7  CL 96* 99*  CO2 19* 17*  GLUCOSE 339* 418*  BUN 23* 26*  CREATININE 1.70* 1.79*  CALCIUM 9.2 7.9*   Liver Function Tests:  Recent Labs Lab 05/07/16 1609 05/08/16 0358  AST 22 19  ALT 19 17  ALKPHOS 69 58  BILITOT 1.1 1.2  PROT 7.7 5.8*  ALBUMIN 3.3* 2.4*   CBC:  Recent Labs Lab 05/07/16 1609 05/08/16 0358  WBC 10.7* 11.5*  NEUTROABS 9.3* 9.7*  HGB 15.4 12.2*  HCT 46.4 37.2*  MCV 84.4 84.0  PLT 213 174  Cardiac Enzymes:  Recent Labs Lab 05/08/16 0900  TROPONINI 0.03*   CBG:  Recent Labs Lab 05/07/16 2350 05/08/16 0024 05/08/16 0531 05/08/16 0656 05/08/16 1142  GLUCAP 359* 362* 347* 351* 268*   Studies: Dg Chest 2 View  Result Date: 05/07/2016 CLINICAL DATA:  Fever and vomiting today. EXAM: CHEST  2 VIEW COMPARISON:  Chest radiograph July 28, 2011 FINDINGS: Cardiomediastinal silhouette is normal. Mild bronchitic changes. No pleural effusions or focal consolidations. Trachea projects midline and there is no pneumothorax. Soft tissue planes and included  osseous structures are non-suspicious. Mild degenerative change of the thoracic spine. IMPRESSION: Mild bronchitic changes without focal consolidation. Electronically Signed   By: Elon Alas M.D.   On: 05/07/2016 18:57   Mr Foot Left Wo Contrast  Result Date: 05/08/2016 CLINICAL DATA:  Diabetic patient with a chronic ulceration on the plantar surface of the foot deep to the third and fourth toes. Fever and chills for 3 days. EXAM: MRI OF THE LEFT FOOT WITHOUT CONTRAST TECHNIQUE: Multiplanar, multisequence MR imaging was performed. No intravenous contrast was administered. COMPARISON:  Plain films left foot 05/07/2016. FINDINGS: A skin ulceration is seen on the plantar surface of the foot at the level of the third and fourth MTP joints. No focal fluid collection is identified with diffuse subcutaneous edema about the foot noted. Marrow edema is seen throughout the proximal phalanges of the third and fourth toes. Mild marrow edema is seen in the plantar surface of the heads of both the third and fourth metatarsals. Bone marrow signal is otherwise unremarkable. No joint effusion is identified. Increased T2 signal throughout all intrinsic musculature of the foot could be due to myositis but likely reflects changes of early atrophy. IMPRESSION: Large skin ulceration on the plantar surface of the foot at the level the third and fourth MTP joints. Marrow edema in the plantar aspect of the heads of the third and fourth metatarsals and throughout the proximal phalanges of the third and fourth toes is compatible with osteomyelitis. Negative for abscess or septic joint. Increased T2 signal throughout all intrinsic musculature the foot likely reflects changes of early atrophy rather than myositis. Electronically Signed   By: Inge Rise M.D.   On: 05/08/2016 09:32   Dg Foot Complete Left  Result Date: 05/07/2016 CLINICAL DATA:  LEFT foot ulcer.  History of diabetes. EXAM: LEFT FOOT - COMPLETE 3+ VIEW  COMPARISON:  LEFT foot radiograph April 29, 2016 FINDINGS: No acute fracture deformity or dislocation. Foreshortened second metatarsus is likely developmental. Pes plana suspected though limited assessment by nonweightbearing radiographs. Soft tissue irregularity medial plantar aspect forefoot with soft tissue swelling, no subcutaneous gas or radiopaque foreign bodies. Mild vascular calcifications. IMPRESSION: Medial plantar skin irregularity consistent with history of ulcer, no subcutaneous gas or radiographic findings of osteomyelitis. Electronically Signed   By: Elon Alas M.D.   On: 05/07/2016 19:01    Scheduled Meds: . insulin aspart  0-9 Units Subcutaneous TID WC  . insulin glargine  10 Units Subcutaneous Once  . [START ON 05/09/2016] insulin glargine  10 Units Subcutaneous Daily  . piperacillin-tazobactam (ZOSYN)  IV  3.375 g Intravenous Q8H  . sodium chloride  500 mL Intravenous Once  . vancomycin  750 mg Intravenous Q12H   Continuous Infusions: . sodium chloride 125 mL/hr at 05/08/16 0645    Principal Problem:   Sepsis ([place]) Active Problems:   Type 2 diabetes mellitus with left diabetic foot ulcer ([place])   ARF (acute renal failure) ([place])  Diabetic ulcer of left foot ([place])    Time spent: 30 minutes    Barton Dubois  [facility] Pager [phone]. If 7PM-7AM, please contact night-coverage at [email or web address], password [facility] 05/08/2016, 2:03 PM  LOS: 1 day

## 2016-05-09 ENCOUNTER — Other Ambulatory Visit (HOSPITAL_COMMUNITY): Payer: Self-pay | Admitting: Family

## 2016-05-09 LAB — URINE CULTURE: Culture: NO GROWTH

## 2016-05-09 LAB — GLUCOSE, CAPILLARY
GLUCOSE-CAPILLARY: 452 mg/dL — AB (ref 65–99)
GLUCOSE-CAPILLARY: 288 mg/dL — AB (ref 65–99)
Glucose-Capillary: 255 mg/dL — ABNORMAL HIGH (ref 65–99)
Glucose-Capillary: 359 mg/dL — ABNORMAL HIGH (ref 65–99)

## 2016-05-09 LAB — HEMOGLOBIN A1C: Hgb A1c MFr Bld: >15.5 % — ABNORMAL HIGH (ref 4.8–5.6)

## 2016-05-09 MED ORDER — INSULIN ASPART 100 UNIT/ML ~~LOC~~ SOLN
15.0000 [IU] | Freq: Once | SUBCUTANEOUS | Status: AC
  Administered 2016-05-09: 15 [IU] via SUBCUTANEOUS

## 2016-05-09 MED ORDER — INSULIN GLARGINE 100 UNIT/ML ~~LOC~~ SOLN
22.0000 [IU] | Freq: Every day | SUBCUTANEOUS | Status: DC
Start: 2016-05-10 — End: 2016-05-11
  Administered 2016-05-10 – 2016-05-11 (×2): 22 [IU] via SUBCUTANEOUS
  Filled 2016-05-09 (×2): qty 0.22

## 2016-05-09 MED ORDER — INSULIN ASPART 100 UNIT/ML ~~LOC~~ SOLN: 5.0000 [IU] | Freq: Three times a day (TID) | SUBCUTANEOUS | Status: DC

## 2016-05-09 MED ORDER — CHLORHEXIDINE GLUCONATE 4 % EX LIQD
CUTANEOUS | Status: AC
  Administered 2016-05-10: 1
  Filled 2016-05-09: qty 60

## 2016-05-09 NOTE — Progress Notes (Addendum)
[facility] PROGRESS NOTE  Edward [name] ZJI:[id number] DOB: May 12, 1971 DOA: 05/07/2016 PCP: [name], MD  Interim summary and HPI 45 y.o. male with history of diabetes mellitus type 2 with previous history of Fournier's gangrene in 2013 presents to the ER because of not feeling well last few days. Denies any chest pain or shortness of breath nausea vomiting or diarrhea. In the ER patient was found to have fever 104F with tachycardia and elevated lactate. Blood sugar was also elevated. Chest x-ray does not show any infiltrates. Patient has chronic wound on his left foot on the plantar aspect and patient states that the discharge from the left foot wound has recently worsened and his left foot is also swollen more than baseline.   Assessment/Plan: 1-Sepsis: due to left foot osteomyelitis  -will continue broad spectrum antibiotics -ortho consulted and base on MRI results, planning for transmetatarsal amputation on 9/22 -will follow rec's -continue supportive care -follow cx data  2-diabetic foot ulcer with osteomyelitis  -as mentioned above -will continue IV antibiotics -patient will need transmetatarsal amputation  -will follow ortho rec's  3-uncontrolled diabetes -will continue SSI and lantus -continue IVF's -lantus increased to 22 units -will follow CBG's fluctuation   4-HTN -stable currently -will continue current antihypertensive regimen   5-hyponatremia: -due to hyperglycemia -continue insulin treatment and IVF's -will follow up electrolytes trend   6-AKI: due to dehydration and sepsis -will continue IVF's -follow renal function trend    Code Status: Full Family Communication: no family at bedside  Disposition Plan: remains inpatient, continue IV antibiotics, will follow orthopedic service recommendations; planning transmetatarsal amputation for 9/22    Consultants:  Dr. Sharol Given (orthopedic service)  Procedures:  See below for x-ray results (MRI  demonstrating left metatarsal osteomyelitis)   Transmetatarsal amputation on 9/22  Antibiotics:  Vancomycin 9/19   Zosyn 9/19  HPI/Subjective: Positive low grade temp overnight. Denies CP and SOB.  Objective: Vitals:   05/09/16 0530 05/09/16 1641  BP: 130/73 132/84  Pulse: 91 88  Resp: 20 18  Temp: 100 F (37.8 C) 99.6 F (37.6 C)    Intake/Output Summary (Last 24 hours) at 05/09/16 1819 Last data filed at 05/09/16 1800  Gross per 24 hour  Intake             1190 ml  Output             2175 ml  Net             -985 ml   Filed Weights   05/07/16 1542 05/08/16 0025 05/09/16 0530  Weight: 104.3 kg (230 lb) 115.3 kg (254 lb 3.2 oz) 103.1 kg (227 lb 6.4 oz)    Exam:   General:  Low grade temp overnight, in no distress overall; reports no much of pain on his Left foot (had neuropathy chronically).  Cardiovascular: S1 and s2, no rubs, no gallops, no murmrus  Respiratory: good air movement, no wheezing   Abdomen: soft, NT, ND, positive BS  Musculoskeletal: no joint swelling, FROM. Left foot with open wound, slight drainage in the borders, no erythema. Positive numbness and no significant pain (due to diabetic neuropathy)  Data Reviewed: Basic Metabolic Panel:  Recent Labs Lab 05/07/16 1609 05/08/16 0358  NA 129* 129*  K 5.1 4.7  CL 96* 99*  CO2 19* 17*  GLUCOSE 339* 418*  BUN 23* 26*  CREATININE 1.70* 1.79*  CALCIUM 9.2 7.9*   Liver Function Tests:  Recent Labs Lab 05/07/16 1609 05/08/16 0358  AST 22 19  ALT 19 17  ALKPHOS 69 58  BILITOT 1.1 1.2  PROT 7.7 5.8*  ALBUMIN 3.3* 2.4*   CBC:  Recent Labs Lab 05/07/16 1609 05/08/16 0358  WBC 10.7* 11.5*  NEUTROABS 9.3* 9.7*  HGB 15.4 12.2*  HCT 46.4 37.2*  MCV 84.4 84.0  PLT 213 174   Cardiac Enzymes:  Recent Labs Lab 05/08/16 0900  TROPONINI 0.03*   CBG:  Recent Labs Lab 05/08/16 1629 05/08/16 2040 05/09/16 0611 05/09/16 1215 05/09/16 1617  GLUCAP 327* 284* 255* 452* 288*    Studies: Dg Chest 2 View  Result Date: 05/07/2016 CLINICAL DATA:  Fever and vomiting today. EXAM: CHEST  2 VIEW COMPARISON:  Chest radiograph July 28, 2011 FINDINGS: Cardiomediastinal silhouette is normal. Mild bronchitic changes. No pleural effusions or focal consolidations. Trachea projects midline and there is no pneumothorax. Soft tissue planes and included osseous structures are non-suspicious. Mild degenerative change of the thoracic spine. IMPRESSION: Mild bronchitic changes without focal consolidation. Electronically Signed   By: Elon Alas M.D.   On: 05/07/2016 18:57   Mr Foot Left Wo Contrast  Result Date: 05/08/2016 CLINICAL DATA:  Diabetic patient with a chronic ulceration on the plantar surface of the foot deep to the third and fourth toes. Fever and chills for 3 days. EXAM: MRI OF THE LEFT FOOT WITHOUT CONTRAST TECHNIQUE: Multiplanar, multisequence MR imaging was performed. No intravenous contrast was administered. COMPARISON:  Plain films left foot 05/07/2016. FINDINGS: A skin ulceration is seen on the plantar surface of the foot at the level of the third and fourth MTP joints. No focal fluid collection is identified with diffuse subcutaneous edema about the foot noted. Marrow edema is seen throughout the proximal phalanges of the third and fourth toes. Mild marrow edema is seen in the plantar surface of the heads of both the third and fourth metatarsals. Bone marrow signal is otherwise unremarkable. No joint effusion is identified. Increased T2 signal throughout all intrinsic musculature of the foot could be due to myositis but likely reflects changes of early atrophy. IMPRESSION: Large skin ulceration on the plantar surface of the foot at the level the third and fourth MTP joints. Marrow edema in the plantar aspect of the heads of the third and fourth metatarsals and throughout the proximal phalanges of the third and fourth toes is compatible with osteomyelitis. Negative for  abscess or septic joint. Increased T2 signal throughout all intrinsic musculature the foot likely reflects changes of early atrophy rather than myositis. Electronically Signed   By: Inge Rise M.D.   On: 05/08/2016 09:32   Dg Foot Complete Left  Result Date: 05/07/2016 CLINICAL DATA:  LEFT foot ulcer.  History of diabetes. EXAM: LEFT FOOT - COMPLETE 3+ VIEW COMPARISON:  LEFT foot radiograph April 29, 2016 FINDINGS: No acute fracture deformity or dislocation. Foreshortened second metatarsus is likely developmental. Pes plana suspected though limited assessment by nonweightbearing radiographs. Soft tissue irregularity medial plantar aspect forefoot with soft tissue swelling, no subcutaneous gas or radiopaque foreign bodies. Mild vascular calcifications. IMPRESSION: Medial plantar skin irregularity consistent with history of ulcer, no subcutaneous gas or radiographic findings of osteomyelitis. Electronically Signed   By: Elon Alas M.D.   On: 05/07/2016 19:01    Scheduled Meds: . insulin aspart  0-9 Units Subcutaneous TID WC  . [START ON 05/10/2016] insulin aspart  5 Units Subcutaneous TID WC  . [START ON 05/10/2016] insulin glargine  22 Units Subcutaneous Daily  . piperacillin-tazobactam (ZOSYN)  IV  3.375 g Intravenous Q8H  . sodium chloride  500 mL Intravenous Once  . vancomycin  750 mg Intravenous Q12H   Continuous Infusions:    Principal Problem:   Sepsis ([place]) Active Problems:   Type 2 diabetes mellitus with left diabetic foot ulcer ([place])   ARF (acute renal failure) ([place])   Diabetic ulcer of left foot ([place])    Time spent: 30 minutes    Barton Dubois  [facility] Pager [phone]. If 7PM-7AM, please contact night-coverage at [email or web address], password [facility] 05/09/2016, 6:19 PM  LOS: 2 days

## 2016-05-09 NOTE — Progress Notes (Signed)
Patient ID: Edward [name], male   DOB: October 13, 1970, 45 y.o.   MRN: [id number] Patient agrees to proceed with a transmetatarsal amputation of the left. We'll plan for surgery tomorrow afternoon Friday. Nothing by mouth after midnight tonight.

## 2016-05-09 NOTE — Plan of Care (Signed)
Problem: Physical Regulation: Goal: Will remain free from infection Outcome: Progressing Patient admitted with osteomyelitis and is on IV antibiotics

## 2016-05-09 NOTE — Progress Notes (Addendum)
Results for OLLY, SHINER (MRN [id number]) as of 05/09/2016 09:43  Ref. Range 05/08/2016 06:56 05/08/2016 11:42 05/08/2016 16:29 05/08/2016 20:40 05/09/2016 06:11  Glucose-Capillary Latest Ref Range: 65 - 99 mg/dL 351 (H) 268 (H) 327 (H) 284 (H) 255 (H)   CBGs continue to be elevated and greater than 180 mg/dl. Recommend increasing Lantus to 20 units daily (103 kg X 0.2 units/kg) and increasing Novolog correction scale to MODERATE TID & HS.  Titrate dosage as needed. Harvel Ricks RN BSN CDE

## 2016-05-10 ENCOUNTER — Inpatient Hospital Stay (HOSPITAL_COMMUNITY): Payer: BLUE CROSS/BLUE SHIELD | Admitting: Certified Registered Nurse Anesthetist

## 2016-05-10 ENCOUNTER — Encounter (HOSPITAL_COMMUNITY)
Admission: EM | Disposition: A | Discharge status: Home Health | Payer: Self-pay | Source: Home / Self Care | Attending: Internal Medicine

## 2016-05-10 HISTORY — PX: AMPUTATION: SHX166

## 2016-05-10 LAB — BASIC METABOLIC PANEL WITH GFR
Anion gap: 11 (ref 5–15)
BUN: 13 mg/dL (ref 6–20)
CO2: 20 mmol/L — ABNORMAL LOW (ref 22–32)
Calcium: 8.2 mg/dL — ABNORMAL LOW (ref 8.9–10.3)
Chloride: 100 mmol/L — ABNORMAL LOW (ref 101–111)
Creatinine, Ser: 1.22 mg/dL (ref 0.61–1.24)
GFR calc Af Amer: >60 mL/min
GFR calc non Af Amer: >60 mL/min
Glucose, Bld: 275 mg/dL — ABNORMAL HIGH (ref 65–99)
Potassium: 3.9 mmol/L (ref 3.5–5.1)
Sodium: 131 mmol/L — ABNORMAL LOW (ref 135–145)

## 2016-05-10 LAB — GLUCOSE, CAPILLARY
GLUCOSE-CAPILLARY: 201 mg/dL — AB (ref 65–99)
GLUCOSE-CAPILLARY: 256 mg/dL — AB (ref 65–99)
Glucose-Capillary: 291 mg/dL — ABNORMAL HIGH (ref 65–99)
Glucose-Capillary: 219 mg/dL — ABNORMAL HIGH (ref 65–99)
Glucose-Capillary: 354 mg/dL — ABNORMAL HIGH (ref 65–99)

## 2016-05-10 LAB — CBC
HCT: 35.3 % — ABNORMAL LOW (ref 39.0–52.0)
HEMOGLOBIN: 11.4 g/dL — AB (ref 13.0–17.0)
MCH: 27.1 pg (ref 26.0–34.0)
MCHC: 32.3 g/dL (ref 30.0–36.0)
MCV: 83.8 fL (ref 78.0–100.0)
Platelets: 202 10*3/uL (ref 150–400)
RBC: 4.21 MIL/uL — AB (ref 4.22–5.81)
RDW: 12.4 % (ref 11.5–15.5)
WBC: 8.4 10*3/uL (ref 4.0–10.5)

## 2016-05-10 LAB — SURGICAL PCR SCREEN
MRSA, PCR: NEGATIVE
Staphylococcus aureus: NEGATIVE

## 2016-05-10 SURGERY — AMPUTATION, FOOT, PARTIAL
Anesthesia: Monitor Anesthesia Care | Site: Foot | Laterality: Left

## 2016-05-10 MED ORDER — METHOCARBAMOL 1000 MG/10ML IJ SOLN
500.0000 mg | Freq: Four times a day (QID) | INTRAVENOUS | Status: DC | PRN
  Filled 2016-05-10: qty 5

## 2016-05-10 MED ORDER — LIVING WELL WITH DIABETES BOOK
Freq: Once | Status: AC
  Administered 2016-05-10: 19:00:00
  Filled 2016-05-10: qty 1

## 2016-05-10 MED ORDER — FENTANYL CITRATE (PF) 100 MCG/2ML IJ SOLN
INTRAMUSCULAR | Status: AC
  Filled 2016-05-10: qty 2

## 2016-05-10 MED ORDER — LIDOCAINE HCL (CARDIAC) 20 MG/ML IV SOLN
INTRAVENOUS | Status: DC | PRN
  Administered 2016-05-10: 50 mg via INTRATRACHEAL

## 2016-05-10 MED ORDER — CEFAZOLIN SODIUM-DEXTROSE 2-4 GM/100ML-% IV SOLN
INTRAVENOUS | Status: AC
  Filled 2016-05-10: qty 100

## 2016-05-10 MED ORDER — HYDROMORPHONE HCL 1 MG/ML IJ SOLN
1.0000 mg | INTRAMUSCULAR | Status: DC | PRN
  Administered 2016-05-11 – 2016-05-12 (×4): 1 mg via INTRAVENOUS
  Filled 2016-05-10 (×4): qty 1

## 2016-05-10 MED ORDER — CEFAZOLIN SODIUM-DEXTROSE 2-4 GM/100ML-% IV SOLN
2.0000 g | INTRAVENOUS | Status: AC
  Administered 2016-05-10: 2 g via INTRAVENOUS

## 2016-05-10 MED ORDER — OXYCODONE HCL 5 MG PO TABS
ORAL_TABLET | ORAL | Status: AC
  Administered 2016-05-10: 10 mg via ORAL
  Filled 2016-05-10: qty 2

## 2016-05-10 MED ORDER — METOCLOPRAMIDE HCL 5 MG PO TABS: 5.0000 mg | ORAL_TABLET | Freq: Three times a day (TID) | ORAL | Status: DC | PRN

## 2016-05-10 MED ORDER — SODIUM CHLORIDE 0.9 % IV SOLN
INTRAVENOUS | Status: DC
  Administered 2016-05-10: 18:00:00 via INTRAVENOUS

## 2016-05-10 MED ORDER — ONDANSETRON HCL 4 MG/2ML IJ SOLN
INTRAMUSCULAR | Status: AC
  Filled 2016-05-10: qty 2

## 2016-05-10 MED ORDER — MIDAZOLAM HCL 5 MG/5ML IJ SOLN
INTRAMUSCULAR | Status: DC | PRN
  Administered 2016-05-10 (×2): 1 mg via INTRAVENOUS

## 2016-05-10 MED ORDER — FENTANYL CITRATE (PF) 100 MCG/2ML IJ SOLN
INTRAMUSCULAR | Status: DC | PRN
  Administered 2016-05-10: 50 ug via INTRAVENOUS

## 2016-05-10 MED ORDER — INSULIN ASPART 100 UNIT/ML ~~LOC~~ SOLN
SUBCUTANEOUS | Status: AC
  Filled 2016-05-10: qty 5

## 2016-05-10 MED ORDER — PROPOFOL 500 MG/50ML IV EMUL
INTRAVENOUS | Status: DC | PRN
  Administered 2016-05-10: 50 ug/kg/min via INTRAVENOUS

## 2016-05-10 MED ORDER — ACETAMINOPHEN 325 MG PO TABS
ORAL_TABLET | ORAL | Status: AC
  Administered 2016-05-10: 650 mg via ORAL
  Filled 2016-05-10: qty 2

## 2016-05-10 MED ORDER — METHOCARBAMOL 500 MG PO TABS
500.0000 mg | ORAL_TABLET | Freq: Four times a day (QID) | ORAL | Status: DC | PRN
  Administered 2016-05-10: 500 mg via ORAL

## 2016-05-10 MED ORDER — PROPOFOL 10 MG/ML IV BOLUS
INTRAVENOUS | Status: DC | PRN
  Administered 2016-05-10: 30 mg via INTRAVENOUS

## 2016-05-10 MED ORDER — ONDANSETRON HCL 4 MG/2ML IJ SOLN: 4.0000 mg | Freq: Four times a day (QID) | INTRAMUSCULAR | Status: DC | PRN

## 2016-05-10 MED ORDER — LIDOCAINE 2% (20 MG/ML) 5 ML SYRINGE
INTRAMUSCULAR | Status: AC
  Filled 2016-05-10: qty 5

## 2016-05-10 MED ORDER — METHOCARBAMOL 500 MG PO TABS
ORAL_TABLET | ORAL | Status: AC
Start: 2016-05-10 — End: 2016-05-10
  Administered 2016-05-10: 500 mg via ORAL
  Filled 2016-05-10: qty 1

## 2016-05-10 MED ORDER — ONDANSETRON HCL 4 MG/2ML IJ SOLN
INTRAMUSCULAR | Status: DC | PRN
  Administered 2016-05-10: 4 mg via INTRAVENOUS

## 2016-05-10 MED ORDER — 0.9 % SODIUM CHLORIDE (POUR BTL) OPTIME
TOPICAL | Status: DC | PRN
  Administered 2016-05-10: 500 mL

## 2016-05-10 MED ORDER — LIDOCAINE-EPINEPHRINE (PF) 1.5 %-1:200000 IJ SOLN
INTRAMUSCULAR | Status: DC | PRN
  Administered 2016-05-10: 10 mL via PERINEURAL

## 2016-05-10 MED ORDER — MIDAZOLAM HCL 2 MG/2ML IJ SOLN
INTRAMUSCULAR | Status: AC
  Filled 2016-05-10: qty 2

## 2016-05-10 MED ORDER — METOCLOPRAMIDE HCL 5 MG/ML IJ SOLN: 5.0000 mg | Freq: Three times a day (TID) | INTRAMUSCULAR | Status: DC | PRN

## 2016-05-10 MED ORDER — CHLORHEXIDINE GLUCONATE 4 % EX LIQD
60.0000 mL | Freq: Once | CUTANEOUS | Status: AC
  Administered 2016-05-10: 4 via TOPICAL
  Filled 2016-05-10: qty 60

## 2016-05-10 MED ORDER — ACETAMINOPHEN 325 MG PO TABS
650.0000 mg | ORAL_TABLET | Freq: Four times a day (QID) | ORAL | Status: DC | PRN
  Administered 2016-05-10: 650 mg via ORAL

## 2016-05-10 MED ORDER — ONDANSETRON HCL 4 MG PO TABS: 4.0000 mg | ORAL_TABLET | Freq: Four times a day (QID) | ORAL | Status: DC | PRN

## 2016-05-10 MED ORDER — ROPIVACAINE HCL 7.5 MG/ML IJ SOLN
INTRAMUSCULAR | Status: DC | PRN
  Administered 2016-05-10: 20 mL via PERINEURAL

## 2016-05-10 MED ORDER — LACTATED RINGERS IV SOLN
INTRAVENOUS | Status: DC
  Administered 2016-05-10 (×2): via INTRAVENOUS

## 2016-05-10 MED ORDER — OXYCODONE HCL 5 MG PO TABS
5.0000 mg | ORAL_TABLET | ORAL | Status: DC | PRN
  Administered 2016-05-10: 10 mg via ORAL
  Administered 2016-05-11 – 2016-05-12 (×2): 5 mg via ORAL
  Administered 2016-05-12 (×2): 10 mg via ORAL
  Filled 2016-05-10: qty 2
  Filled 2016-05-10: qty 1
  Filled 2016-05-10 (×2): qty 2

## 2016-05-10 MED ORDER — ACETAMINOPHEN 650 MG RE SUPP: 650.0000 mg | Freq: Four times a day (QID) | RECTAL | Status: DC | PRN

## 2016-05-10 SURGICAL SUPPLY — 32 items
APL SKNCLS STERI-STRIP NONHPOA (GAUZE/BANDAGES/DRESSINGS) ×2
BENZOIN TINCTURE PRP APPL 2/3 (GAUZE/BANDAGES/DRESSINGS) ×4 IMPLANT
BLADE SAW SGTL HD 18.5X60.5X1. (BLADE) ×3 IMPLANT
BLADE SURG 10 STRL SS (BLADE) IMPLANT
BNDG COHESIVE 4X5 TAN STRL (GAUZE/BANDAGES/DRESSINGS) ×6 IMPLANT
BNDG GAUZE ELAST 4 BULKY (GAUZE/BANDAGES/DRESSINGS) ×6 IMPLANT
COVER SURGICAL LIGHT HANDLE (MISCELLANEOUS) ×6 IMPLANT
DRAPE INCISE IOBAN 66X45 STRL (DRAPES) ×2 IMPLANT
DRAPE U-SHAPE 47X51 STRL (DRAPES) ×3 IMPLANT
DRSG ADAPTIC 3X8 NADH LF (GAUZE/BANDAGES/DRESSINGS) ×3 IMPLANT
DRSG PAD ABDOMINAL 8X10 ST (GAUZE/BANDAGES/DRESSINGS) ×3 IMPLANT
DURAPREP 26ML APPLICATOR (WOUND CARE) ×3 IMPLANT
ELECT REM PT RETURN 9FT ADLT (ELECTROSURGICAL) ×3
ELECTRODE REM PT RTRN 9FT ADLT (ELECTROSURGICAL) ×1 IMPLANT
GAUZE SPONGE 4X4 12PLY STRL (GAUZE/BANDAGES/DRESSINGS) ×3 IMPLANT
GLOVE BIOGEL PI IND STRL 9 (GLOVE) ×1 IMPLANT
GLOVE BIOGEL PI INDICATOR 9 (GLOVE) ×2
GLOVE SURG ORTHO 9.0 STRL STRW (GLOVE) ×3 IMPLANT
GOWN STRL REUS W/ TWL XL LVL3 (GOWN DISPOSABLE) ×3 IMPLANT
GOWN STRL REUS W/TWL XL LVL3 (GOWN DISPOSABLE) ×9
KIT BASIN OR (CUSTOM PROCEDURE TRAY) ×3 IMPLANT
KIT PREVENA INCISION MGT 13 (CANNISTER) ×2 IMPLANT
KIT ROOM TURNOVER OR (KITS) ×3 IMPLANT
NS IRRIG 1000ML POUR BTL (IV SOLUTION) ×3 IMPLANT
PACK ORTHO EXTREMITY (CUSTOM PROCEDURE TRAY) ×3 IMPLANT
PAD ARMBOARD 7.5X6 YLW CONV (MISCELLANEOUS) ×6 IMPLANT
SPONGE LAP 18X18 X RAY DECT (DISPOSABLE) ×3 IMPLANT
SUT ETHILON 2 0 PSLX (SUTURE) ×9 IMPLANT
SUT VIC AB 2-0 CTB1 (SUTURE) IMPLANT
TOWEL OR 17X24 6PK STRL BLUE (TOWEL DISPOSABLE) ×3 IMPLANT
TOWEL OR 17X26 10 PK STRL BLUE (TOWEL DISPOSABLE) ×3 IMPLANT
WATER STERILE IRR 1000ML POUR (IV SOLUTION) ×3 IMPLANT

## 2016-05-10 NOTE — Transfer of Care (Signed)
Immediate Anesthesia Transfer of Care Note  Patient: Edward [name]  Procedure(s) Performed: Procedure(s): Left Transmetatarsal Amputation (Left)  Patient Location: PACU  Anesthesia Type:MAC combined with regional for post-op pain  Level of Consciousness: awake, alert  and oriented  Airway & Oxygen Therapy: Patient Spontanous Breathing and Patient connected to nasal cannula oxygen  Post-op Assessment: Report given to RN and Post -op Vital signs reviewed and stable  Post vital signs: Reviewed and stable  Last Vitals:  Vitals:   05/10/16 0524 05/10/16 1244  BP: (!) 143/80 123/82  Pulse: 85 76  Resp: 18 18  Temp: 37.2 C 36.9 C    Last Pain:  Vitals:   05/10/16 1244  TempSrc: Oral  PainSc:          Complications: No apparent anesthesia complications

## 2016-05-10 NOTE — Interval H&P Note (Signed)
History and Physical Interval Note:  05/10/2016 7:20 AM  Edward [name]  has presented today for surgery, with the diagnosis of Osteomyelitis Left Foot  The various methods of treatment have been discussed with the patient and family. After consideration of risks, benefits and other options for treatment, the patient has consented to  Procedure(s): Left Transmetatarsal Amputation (Left) as a surgical intervention .  The patient's history has been reviewed, patient examined, no change in status, stable for surgery.  I have reviewed the patient's chart and labs.  Questions were answered to the patient's satisfaction.     [name]

## 2016-05-10 NOTE — Anesthesia Procedure Notes (Signed)
Anesthesia Regional Block:  Popliteal block  Pre-Anesthetic Checklist: ,, timeout performed, Correct Patient, Correct Site, Correct Laterality, Correct Procedure, Correct Position, site marked, Risks and benefits discussed,  Surgical consent,  Pre-op evaluation,  At surgeon's request and post-op pain management  Laterality: Lower and Left  Prep: chloraprep       Needles:  Injection technique: Single-shot  Needle Type: Echogenic Stimulator Needle          Additional Needles:  Procedures: ultrasound guided (picture in chart) and nerve stimulator Popliteal block Narrative:  Injection made incrementally with aspirations every 5 mL.  Performed by: Personally  Anesthesiologist: Navika Hoopes  Additional Notes: H+P and labs reviewed, risks and benefits discussed with patient, procedure tolerated well without complications

## 2016-05-10 NOTE — Progress Notes (Signed)
1455 report given to [place]

## 2016-05-10 NOTE — Op Note (Signed)
     Date of Surgery: 05/10/2016  INDICATIONS: Edward [name] is a 45 y.o.-year-old male who has osteomyelitis and ulceration beneath the third and fourth metatarsal heads with diabetic insensate neuropathy is failed prolonged conservative wound care...  PREOPERATIVE DIAGNOSIS: Osteomyelitis ulceration diabetic insensate neuropathy left foot  POSTOPERATIVE DIAGNOSIS: Same.  PROCEDURE: Transmetatarsal amputation left Application of Prevena wound VAC  SURGEON: Sharol Given, M.D.  ANESTHESIA:  general  IV FLUIDS AND URINE: See anesthesia.  ESTIMATED BLOOD LOSS: Minimal mL.  COMPLICATIONS: None.  DESCRIPTION OF PROCEDURE: The patient was brought to the operating room and underwent a regional and general anesthetic. After adequate levels of anesthesia were obtained patient's lower extremity was prepped using DuraPrep draped into a sterile field. A timeout was called.  A fishmouth incision was made just proximal to the ulcerative nonviable tissue. This was carried sharply down to bone. A oscillating saw was used to perform a transmetatarsal amputation with a gentle cascade of the metatarsals and beveled plantarly. Electrocautery was used for hemostasis. The wound was irrigated with normal saline. The incision was closed using 2-0 nylon. A Prevena wound VAC was applied. This had a good suction fit. Patient was taken to the PACU in stable condition.  Edward Score, MD [place] 4:21 PM

## 2016-05-10 NOTE — H&P (View-Only) (Signed)
Patient ID: Edward [name], male   DOB: [birth year], 45 y.o.   MRN: [id number] Patient agrees to proceed with a transmetatarsal amputation of the left. We'll plan for surgery tomorrow afternoon Friday. Nothing by mouth after midnight tonight.

## 2016-05-10 NOTE — Telephone Encounter (Signed)
Please let them know that I am more than happy to see him once he is discharged or if there is anything else I can do for him.

## 2016-05-10 NOTE — Progress Notes (Signed)
Spoke with patient about his elevated A1C of 15.5%.  He was aware that it was elevated, but not that high.  Discussed with him the importance of keeping blood sugars down.  States that he has been on Janumet for a while, but does not take it everyday. States that he works both day and night shifts, alternating 8 hour shifts. Will probably need insulin at discharge.  Sees Dr. Berdine Addison at the [facility]. Encouraged him to see him as soon as he is discharged. Will be having surgery today on left foot. Stressed importance of checking blood sugars at home, taking medications, checking feet at night.  States that he would be willing to take insulin. Will order Living Well with Diabetes booklet, can watch DM videos, and may need to talk with dietician about meal planning and work schedule. Will continue to monitor blood sugars while in the hospital. Harvel Ricks RN BSN CDE

## 2016-05-10 NOTE — Progress Notes (Signed)
1745 received from oPacu via bed with pacu RN pt awake aler and oriented ,foot wound s/p amputee dry and clean with wound vac attached at 125 mm hg . Pulses detectable weak with doppler , leg raised on pillows as ordered. Pt wit sensation to operative site . Refused pain when offered . Pt  calm and pleasant . Kept comfortable in bed. Dinner  ordeerd

## 2016-05-10 NOTE — Progress Notes (Signed)
Orthopedic Tech Progress Note Patient Details:  Edward [name] 1970-12-19 [id number]  Ortho Devices Type of Ortho Device: Postop shoe/boot Ortho Device/Splint Location: LLE Ortho Device/Splint Interventions: Ordered, Application   Braulio Bosch 05/10/2016, 8:19 PM

## 2016-05-10 NOTE — Progress Notes (Signed)
  RD consulted for nutrition education regarding diabetes.   Lab Results  Component Value Date   HGBA1C >15.5 (H) 05/08/2016    Patient out of room for surgery at time of visit. RD left  "Carbohydrate Counting for People with Diabetes" handout from the Academy of [employer] in patient room. Left note for patient and discussed with RN to page dietitian when patient is available for nutrition education.   Scarlette Ar RD, CSP, LDN Inpatient Clinical Dietitian Pager: [phone] After Hours Pager: [phone]

## 2016-05-10 NOTE — Progress Notes (Signed)
[facility] PROGRESS NOTE  Edward [name] DGU:[id number] DOB: July 29, 1971 DOA: 05/07/2016 PCP: [name], MD  Interim summary and HPI 45 y.o. male with history of diabetes mellitus type 2 with previous history of Fournier's gangrene in 2013 presents to the ER because of not feeling well last few days. Denies any chest pain or shortness of breath nausea vomiting or diarrhea. In the ER patient was found to have fever 104F with tachycardia and elevated lactate. Blood sugar was also elevated. Chest x-ray does not show any infiltrates. Patient has chronic wound on his left foot on the plantar aspect and patient states that the discharge from the left foot wound has recently worsened and his left foot is also swollen more than baseline.   Assessment/Plan: 1-Sepsis: due to left foot osteomyelitis  -will continue broad spectrum antibiotics -ortho consulted and base on MRI results, planning for transmetatarsal amputation later today 9/22 -will follow rec's -continue supportive care -follow cx data  2-diabetic foot ulcer with osteomyelitis  -as mentioned above -will continue IV antibiotics -patient will need transmetatarsal amputation; planned for later today 9/22  -will follow ortho rec's  3-uncontrolled diabetes -will continue SSI and lantus -continue IVF's -continue adjusting insulin as needed for better control. Today patient is NPO, so will hold on further adjustments -will follow CBG's fluctuation; infection contributing to it   4-HTN -stable currently -will continue current antihypertensive regimen   5-hyponatremia: -due to hyperglycemia -continue insulin treatment and IVF's -will follow up electrolytes trend   6-AKI: due to dehydration and sepsis -will continue IVF's -follow renal function trend  -Cr 1.22   Code Status: Full Family Communication: no family at bedside  Disposition Plan: remains inpatient, continue IV antibiotics, will follow orthopedic service  recommendations; planning transmetatarsal amputation for later today 9/22    Consultants:  Dr. Sharol Given (orthopedic service)  Procedures:  See below for x-ray results (MRI demonstrating left metatarsal osteomyelitis)   Transmetatarsal amputation 9/22  Antibiotics:  Vancomycin 9/19   Zosyn 9/19  HPI/Subjective: Denies CP and SOB. Patient is ready for surgery and in no distress currently   Objective: Vitals:   05/10/16 1700 05/10/16 1736  BP: (!) 154/101 (!) 140/91  Pulse: 69 78  Resp: 12 18  Temp: 98 F (36.7 C) 98.3 F (36.8 C)    Intake/Output Summary (Last 24 hours) at 05/10/16 1807 Last data filed at 05/10/16 1610  Gross per 24 hour  Intake              940 ml  Output             1310 ml  Net             -370 ml   Filed Weights   05/08/16 0025 05/09/16 0530 05/10/16 0524  Weight: 115.3 kg (254 lb 3.2 oz) 103.1 kg (227 lb 6.4 oz) 101.9 kg (224 lb 11.2 oz)    Exam:   General:  Low grade temp overnight, in no distress overall. Patient ready for surgery   Cardiovascular: S1 and s2, no rubs, no gallops, no murmrus  Respiratory: good air movement, no wheezing   Abdomen: soft, NT, ND, positive BS  Musculoskeletal: no joint swelling, FROM. Left foot with open wound, slight drainage in the borders, no erythema. Positive numbness and no significant pain (due to diabetic neuropathy)  Data Reviewed: Basic Metabolic Panel:  Recent Labs Lab 05/07/16 1609 05/08/16 0358 05/10/16 0329  NA 129* 129* 131*  K 5.1 4.7 3.9  CL 96* 99*  100*  CO2 19* 17* 20*  GLUCOSE 339* 418* 275*  BUN 23* 26* 13  CREATININE 1.70* 1.79* 1.22  CALCIUM 9.2 7.9* 8.2*   Liver Function Tests:  Recent Labs Lab 05/07/16 1609 05/08/16 0358  AST 22 19  ALT 19 17  ALKPHOS 69 58  BILITOT 1.1 1.2  PROT 7.7 5.8*  ALBUMIN 3.3* 2.4*   CBC:  Recent Labs Lab 05/07/16 1609 05/08/16 0358 05/10/16 0329  WBC 10.7* 11.5* 8.4  NEUTROABS 9.3* 9.7*  --   HGB 15.4 12.2* 11.4*  HCT 46.4  37.2* 35.3*  MCV 84.4 84.0 83.8  PLT 213 174 202   Cardiac Enzymes:  Recent Labs Lab 05/08/16 0900  TROPONINI 0.03*   CBG:  Recent Labs Lab 05/09/16 2101 05/10/16 0617 05/10/16 1137 05/10/16 1504 05/10/16 1627  GLUCAP 359* 291* 219* 201* 256*   Studies: No results found.  Scheduled Meds: . insulin aspart      . insulin aspart  0-9 Units Subcutaneous TID WC  . insulin aspart  5 Units Subcutaneous TID WC  . insulin glargine  22 Units Subcutaneous Daily  . living well with diabetes book   Does not apply Once  . piperacillin-tazobactam (ZOSYN)  IV  3.375 g Intravenous Q8H  . sodium chloride  500 mL Intravenous Once  . vancomycin  750 mg Intravenous Q12H   Continuous Infusions: . sodium chloride    . lactated ringers 10 mL/hr at 05/10/16 1504    Principal Problem:   Sepsis ([place]) Active Problems:   Type 2 diabetes mellitus with left diabetic foot ulcer ([place])   ARF (acute renal failure) ([place])   Diabetic ulcer of left foot ([place])    Time spent: 30 minutes    Barton Dubois  [facility] Pager [phone]. If 7PM-7AM, please contact night-coverage at [email or web address], password [facility] 05/10/2016, 6:07 PM  LOS: 3 days

## 2016-05-10 NOTE — Progress Notes (Addendum)
38  Spoken with Dr Orson Slick to  Give full dose of  Lantus coverage Aware of cbg

## 2016-05-10 NOTE — Anesthesia Preprocedure Evaluation (Signed)
Anesthesia Evaluation  Patient identified by MRN, date of birth, ID band Patient awake    Reviewed: Allergy & Precautions, H&P , NPO status , Patient's Chart, lab work & pertinent test results  History of Anesthesia Complications Negative for: history of anesthetic complications  Airway Mallampati: I  TM Distance: >3 FB Neck ROM: Full    Dental no notable dental hx. (+) Teeth Intact   Pulmonary neg pulmonary ROS, Current Smoker,    Pulmonary exam normal breath sounds clear to auscultation       Cardiovascular hypertension, On Medications  Rhythm:Regular Rate:Normal     Neuro/Psych negative neurological ROS  negative psych ROS   GI/Hepatic negative GI ROS, Neg liver ROS,   Endo/Other  negative endocrine ROSdiabetes, Well Controlled, Type 2, Oral Hypoglycemic AgentsMorbid obesity  Renal/GU Renal InsufficiencyRenal disease  negative genitourinary   Musculoskeletal   Abdominal   Peds  Hematology negative hematology ROS (+)   Anesthesia Other Findings   Reproductive/Obstetrics negative OB ROS                             Anesthesia Physical Anesthesia Plan  ASA: III  Anesthesia Plan: Regional and MAC   Post-op Pain Management:    Induction:   Airway Management Planned: Natural Airway, Nasal Cannula and Simple Face Mask  Additional Equipment: None  Intra-op Plan:   Post-operative Plan:   Informed Consent: I have reviewed the patients History and Physical, chart, labs and discussed the procedure including the risks, benefits and alternatives for the proposed anesthesia with the patient or authorized representative who has indicated his/her understanding and acceptance.   Dental advisory given  Plan Discussed with: CRNA and Surgeon  Anesthesia Plan Comments:         Anesthesia Quick Evaluation

## 2016-05-11 DIAGNOSIS — Z89432 Acquired absence of left foot: Secondary | ICD-10-CM

## 2016-05-11 LAB — GLUCOSE, CAPILLARY
GLUCOSE-CAPILLARY: 289 mg/dL — AB (ref 65–99)
Glucose-Capillary: 283 mg/dL — ABNORMAL HIGH (ref 65–99)
Glucose-Capillary: 230 mg/dL — ABNORMAL HIGH (ref 65–99)
Glucose-Capillary: 199 mg/dL — ABNORMAL HIGH (ref 65–99)

## 2016-05-11 MED ORDER — INSULIN ASPART 100 UNIT/ML ~~LOC~~ SOLN
5.0000 [IU] | Freq: Three times a day (TID) | SUBCUTANEOUS | Status: DC
  Administered 2016-05-11 – 2016-05-13 (×7): 5 [IU] via SUBCUTANEOUS

## 2016-05-11 MED ORDER — INSULIN GLARGINE 100 UNIT/ML ~~LOC~~ SOLN
30.0000 [IU] | Freq: Every day | SUBCUTANEOUS | Status: DC
  Administered 2016-05-12 – 2016-05-13 (×2): 30 [IU] via SUBCUTANEOUS
  Filled 2016-05-11 (×2): qty 0.3

## 2016-05-11 MED ORDER — INSULIN ASPART 100 UNIT/ML ~~LOC~~ SOLN
0.0000 [IU] | Freq: Three times a day (TID) | SUBCUTANEOUS | Status: DC
  Administered 2016-05-11: 5 [IU] via SUBCUTANEOUS
  Administered 2016-05-11: 3 [IU] via SUBCUTANEOUS
  Administered 2016-05-11: 5 [IU] via SUBCUTANEOUS
  Administered 2016-05-12 (×3): 2 [IU] via SUBCUTANEOUS
  Administered 2016-05-13: 3 [IU] via SUBCUTANEOUS

## 2016-05-11 NOTE — Care Management Note (Signed)
Case Management Note  Patient Details  Name: Edward [name] MRN: [id number] Date of Birth: 1971-04-16  Subjective/Objective:                    Action/Plan:   Expected Discharge Date:                  Expected Discharge Plan:  [place]  In-House Referral:     Discharge planning Services  CM Consult  Post Acute Care Choice:  Home Health Choice offered to:  Patient, Parent  DME Arranged:  3-N-1, Walker rolling, Wheelchair manual DME Agency:  [place]:  PT [place] Agency:  [place]  Status of Service:  In process, will continue to follow  If discussed at Long Length of Stay Meetings, dates discussed:    Additional Comments: CM met with pt in room to offer choice of home health agency.  Pt chooses [facility] to render HHPT.  CM has reqeusted orders from MD for HHPT and for wheelchair, 3n1 and rolling walker.  Referral called to [facility] rep, Jermaine to please monitor for HHPT order.  CM called [facility] DME rep, Reggie to please deliver the wheelchair, 3n1 and rolling walker to room as soon as MD places orders.  CM will continue to monitor for orders. Dellie Catholic, RN 05/11/2016, 12:13 PM

## 2016-05-11 NOTE — Progress Notes (Signed)
Pt had to be weighed in bed this morning due to postop toe amputation. Weight yesterday standing was charted 101.9 kg and bed weight this morning reads 115.4 kg. Discrepancy noted due to weight type.

## 2016-05-11 NOTE — Progress Notes (Signed)
Pharmacy Antibiotic Note  Edward [name] is a 45 y.o. male admitted on 05/07/2016 with weakness, chills, and pain in left ankle.  Pharmacy has been consulted for zosyn and vancomycin dosing for osteomyelitis - day #6. S/p transmetatarsal amputation on 9/22. Afebrile, wbc trend down to wnl, LA trending down. SCr improved from 1.79>1.22 on 9/22, normalized CrCl~77, good UOP. Cultures negative to date.  Plan: Vancomycin 750 IV every 12 hours.  Goal trough 15-20 mcg/mL. Zosyn 3.375g IV q8h (4 hour infusion).  Monitor renal function, culture results, clinical picture, and vancomycin trough as needed F/u LOT post-amputation, BMET in AM  Height: 6\' 1"  (185.4 cm) Weight: 254 lb 8 oz (115.4 kg) (Bed Scale pt had toes amputated off left foot; rn in room) IBW/kg (Calculated) : 79.9  Temp (24hrs), Avg:98.5 F (36.9 C), Min:98 F (36.7 C), Max:99.4 F (37.4 C)   Recent Labs Lab 05/07/16 1609 05/07/16 1632 05/07/16 1827 05/08/16 0009 05/08/16 0358 05/08/16 0401 05/10/16 0329  WBC 10.7*  --   --   --  11.5*  --  8.4  CREATININE 1.70*  --   --   --  1.79*  --  1.22  LATICACIDVEN  --  3.58* 1.56 1.5  --  1.2  --     Estimated Creatinine Clearance: 100.7 mL/min (by C-G formula based on SCr of 1.22 mg/dL).    Allergies  Allergen Reactions  . No Known Allergies     Antimicrobials this admission: 9/18 Vanc >> 9/18 Zosyn >>   Dose adjustments this admission: N/A  Microbiology results:  9/20 Ucx - negative 9/19 BCx2 - ngtd  Elicia Lamp, PharmD, [facility] Clinical Pharmacist Pager [phone] 05/11/2016 2:44 PM

## 2016-05-11 NOTE — Evaluation (Signed)
Physical Therapy Evaluation Patient Details Name: Edward [name] MRN: [id number] DOB: [birth year] Today's Date: 05/11/2016   History of Present Illness  Pt is a 45 y.o. male with hx of DM and gangrene L foot. He was admitted for sepsis resulting from osteomyelitis L foot ulcer. Pt underwent L transmetatarsal amputation 05-10-16.  Clinical Impression  Pt admitted with above diagnosis. Pt currently with functional limitations due to the deficits listed below (see PT Problem List). On eval, pt required min assist transfers and min guard assist ambulation 25 feet with RW. Pt able to maintain NWB LLE. Pt will benefit from skilled PT to increase their independence and safety with mobility to allow discharge to the venue listed below.  Recommend w/c for community distances.     Follow Up Recommendations Home health PT;Supervision - Intermittent    Equipment Recommendations  Rolling walker with 5" wheels;3in1 (PT);Wheelchair (measurements PT);Wheelchair cushion (measurements PT)    Recommendations for Other Services       Precautions / Restrictions Precautions Precautions: Fall;Other (comment) Precaution Comments: wound vac Required Braces or Orthoses: Other Brace/Splint Other Brace/Splint: post op shoe Restrictions Weight Bearing Restrictions: Yes LLE Weight Bearing: Non weight bearing      Mobility  Bed Mobility Overal bed mobility: Modified Independent                Transfers Overall transfer level: Needs assistance Equipment used: Rolling walker (2 wheeled) Transfers: Sit to/from [facility] Sit to Stand: Min assist Stand pivot transfers: Min assist       General transfer comment: verbal cues for hand placement and sequencing, assist to power up  Ambulation/Gait Ambulation/Gait assistance: Min guard Ambulation Distance (Feet): 25 Feet Assistive device: Rolling walker (2 wheeled) Gait Pattern/deviations: Step-to pattern;Antalgic;Decreased stride  length Gait velocity: decreased Gait velocity interpretation: Below normal speed for age/gender General Gait Details: Pt able to maintain NWB LLE  Stairs            Wheelchair Mobility    Modified Rankin (Stroke Patients Only)       Balance Overall balance assessment: Needs assistance Sitting-balance support: No upper extremity supported;Feet supported Sitting balance-Leahy Scale: Normal     Standing balance support: During functional activity;Bilateral upper extremity supported Standing balance-Leahy Scale: Fair Standing balance comment: heavy reliance on RW due to LLE NWB                             Pertinent Vitals/Pain Pain Assessment: 0-10 Pain Score: 3  Pain Location: L foot Pain Descriptors / Indicators: Throbbing;Tightness Pain Intervention(s): Monitored during session;Limited activity within patient's tolerance    Home Living Family/patient expects to be discharged to:: Private residence Living Arrangements: Children Available Help at Discharge: Family;Available PRN/intermittently Type of Home: House Home Access: Stairs to enter   [employer] of Steps: 1 Home Layout: One level Home Equipment: None      Prior Function Level of Independence: Independent               Hand Dominance        Extremity/Trunk Assessment                         Communication   Communication: No difficulties  Cognition Arousal/Alertness: Awake/alert Behavior During Therapy: WFL for tasks assessed/performed Overall Cognitive Status: Within Functional Limits for tasks assessed  General Comments      Exercises     Assessment/Plan    PT Assessment Patient needs continued PT services  PT Problem List Decreased activity tolerance;Decreased balance;Decreased mobility;Decreased knowledge of precautions;Decreased knowledge of use of DME          PT Treatment Interventions DME instruction;Gait  training;Stair training;Functional mobility training;Balance training;Therapeutic activities;Therapeutic exercise;Patient/family education;Wheelchair mobility training    PT Goals (Current goals can be found in the Care Plan section)  Acute Rehab PT Goals Patient Stated Goal: independence PT Goal Formulation: With patient Time For Goal Achievement: 05/25/16 Potential to Achieve Goals: Good    Frequency Min 3X/week   Barriers to discharge        Co-evaluation               End of Session Equipment Utilized During Treatment: Gait belt Activity Tolerance: Patient tolerated treatment well Patient left: in bed;with call bell/phone within reach;with family/visitor present Nurse Communication: Mobility status         Time: 0922-0946 PT Time Calculation (min) (ACUTE ONLY): 24 min   Charges:   PT Evaluation $PT Eval Moderate Complexity: 1 Procedure PT Treatments $Gait Training: 8-22 mins   PT G Codes:        Lorriane Shire 05/11/2016, 10:03 AM

## 2016-05-11 NOTE — Progress Notes (Signed)
[facility] PROGRESS NOTE  Edward [name] GYB:[id number] DOB: 11/12/1970 DOA: 05/07/2016 PCP: [name], MD  Interim summary and HPI 45 y.o. male with history of diabetes mellitus type 2 with previous history of Fournier's gangrene in 2013 presents to the ER because of not feeling well last few days. Denies any chest pain or shortness of breath nausea vomiting or diarrhea. In the ER patient was found to have fever 104F with tachycardia and elevated lactate. Blood sugar was also elevated. Chest x-ray does not show any infiltrates. Patient has chronic wound on his left foot on the plantar aspect and patient states that the discharge from the left foot wound has recently worsened and his left foot is also swollen more than baseline.   Assessment/Plan: 1-Sepsis: due to left foot osteomyelitis  -will continue broad spectrum antibiotics for 48 hours after amputation -s/p transmetatarsal amputation (9/22); well tolerated -will follow rec's from orthopedic service  -continue supportive care -follow cx data -will follow PT/OT rec's  2-diabetic foot ulcer with osteomyelitis  -as mentioned above -will continue IV antibiotics for 48 hours past amputation; then planning 10 days of doxycycline  -patient is s/p transmetatarsal amputation, well tolerated and in no major distress.  -will follow ortho rec's  3-uncontrolled diabetes -will continue SSI and lantus -continue adjusting insulin as needed for better control. Lantus increased to 30 units. -will follow CBG's fluctuation; infection contributing to it  -continue modified carb diet   4-HTN -stable currently -will continue current antihypertensive regimen   5-hyponatremia: -due to hyperglycemia -continue insulin treatment and IVF's -will follow up electrolytes trend; but essentially resolved now   6-AKI: due to dehydration and sepsis -will continue IVF's -follow renal function trend  -last Cr 1.22  Code Status: Full Family  Communication: no family at bedside  Disposition Plan: remains inpatient, continue IV antibiotics for 48 hours after surgery, will follow orthopedic service recommendations as part of wound care.   Consultants:  Dr. Sharol Given (orthopedic service)  Procedures:  See below for x-ray results (MRI demonstrating left metatarsal osteomyelitis)   Transmetatarsal amputation 9/22  Antibiotics:  Vancomycin 9/19   Zosyn 9/19  HPI/Subjective: Denies CP and SOB. Patient is status post amputation (transmetatarsal amp, left foot). Afebrile and reporting pain to be well controlled.    Objective: Vitals:   05/11/16 0617 05/11/16 1251  BP: (!) 150/92 (!) 131/92  Pulse: 84 81  Resp: 18 20  Temp: 99.4 F (37.4 C) 98.6 F (37 C)    Intake/Output Summary (Last 24 hours) at 05/11/16 1713 Last data filed at 05/11/16 1442  Gross per 24 hour  Intake              530 ml  Output             1250 ml  Net             -720 ml   Filed Weights   05/09/16 0530 05/10/16 0524 05/11/16 0617  Weight: 103.1 kg (227 lb 6.4 oz) 101.9 kg (224 lb 11.2 oz) 115.4 kg (254 lb 8 oz)    Exam:   General:  Afebrile, status post transmetatarsal amputation; no CP or SOB. reports pain is well controlled.  Cardiovascular: S1 and s2, no rubs, no gallops, no murmrus  Respiratory: good air movement, no wheezing   Abdomen: soft, NT, ND, positive BS  Musculoskeletal: no joint swelling, FROM. Left foot with transmetatarsal amputation. wound vac in place.   Data Reviewed: Basic Metabolic Panel:  Recent Labs Lab  05/07/16 1609 05/08/16 0358 05/10/16 0329  NA 129* 129* 131*  K 5.1 4.7 3.9  CL 96* 99* 100*  CO2 19* 17* 20*  GLUCOSE 339* 418* 275*  BUN 23* 26* 13  CREATININE 1.70* 1.79* 1.22  CALCIUM 9.2 7.9* 8.2*   Liver Function Tests:  Recent Labs Lab 05/07/16 1609 05/08/16 0358  AST 22 19  ALT 19 17  ALKPHOS 69 58  BILITOT 1.1 1.2  PROT 7.7 5.8*  ALBUMIN 3.3* 2.4*   CBC:  Recent Labs Lab  05/07/16 1609 05/08/16 0358 05/10/16 0329  WBC 10.7* 11.5* 8.4  NEUTROABS 9.3* 9.7*  --   HGB 15.4 12.2* 11.4*  HCT 46.4 37.2* 35.3*  MCV 84.4 84.0 83.8  PLT 213 174 202   Cardiac Enzymes:  Recent Labs Lab 05/08/16 0900  TROPONINI 0.03*   CBG:  Recent Labs Lab 05/10/16 1627 05/10/16 2234 05/11/16 0622 05/11/16 1133 05/11/16 1617  GLUCAP 256* 354* 283* 289* 230*   Studies: No results found.  Scheduled Meds: . insulin aspart  0-9 Units Subcutaneous TID WC  . insulin aspart  5 Units Subcutaneous TID WC  . insulin glargine  22 Units Subcutaneous Daily  . piperacillin-tazobactam (ZOSYN)  IV  3.375 g Intravenous Q8H  . sodium chloride  500 mL Intravenous Once  . vancomycin  750 mg Intravenous Q12H   Continuous Infusions: . sodium chloride 10 mL/hr at 05/10/16 1800  . lactated ringers 10 mL/hr at 05/10/16 1504    Principal Problem:   Sepsis ([place]) Active Problems:   Type 2 diabetes mellitus with left diabetic foot ulcer ([place])   ARF (acute renal failure) ([place])   Diabetic ulcer of left foot ([place])    Time spent: 30 minutes    Barton Dubois  [facility] Pager [phone]. If 7PM-7AM, please contact night-coverage at [email or web address], password [facility] 05/11/2016, 5:13 PM  LOS: 4 days

## 2016-05-12 LAB — BASIC METABOLIC PANEL
ANION GAP: 8 (ref 5–15)
BUN: 10 mg/dL (ref 6–20)
CHLORIDE: 102 mmol/L (ref 101–111)
CO2: 23 mmol/L (ref 22–32)
Calcium: 8.1 mg/dL — ABNORMAL LOW (ref 8.9–10.3)
Creatinine, Ser: 1.14 mg/dL (ref 0.61–1.24)
Glucose, Bld: 186 mg/dL — ABNORMAL HIGH (ref 65–99)
POTASSIUM: 4 mmol/L (ref 3.5–5.1)
SODIUM: 133 mmol/L — AB (ref 135–145)

## 2016-05-12 LAB — GLUCOSE, CAPILLARY
GLUCOSE-CAPILLARY: 188 mg/dL — AB (ref 65–99)
GLUCOSE-CAPILLARY: 167 mg/dL — AB (ref 65–99)
GLUCOSE-CAPILLARY: 191 mg/dL — AB (ref 65–99)
Glucose-Capillary: 167 mg/dL — ABNORMAL HIGH (ref 65–99)

## 2016-05-12 LAB — CULTURE, BLOOD (ROUTINE X 2): Culture: NO GROWTH

## 2016-05-12 LAB — CBC
HEMATOCRIT: 33.5 % — AB (ref 39.0–52.0)
HEMOGLOBIN: 10.5 g/dL — AB (ref 13.0–17.0)
MCH: 26.5 pg (ref 26.0–34.0)
MCHC: 31.3 g/dL (ref 30.0–36.0)
MCV: 84.6 fL (ref 78.0–100.0)
Platelets: 252 10*3/uL (ref 150–400)
RBC: 3.96 MIL/uL — AB (ref 4.22–5.81)
RDW: 12.5 % (ref 11.5–15.5)
WBC: 8.6 10*3/uL (ref 4.0–10.5)

## 2016-05-12 MED ORDER — DOXYCYCLINE HYCLATE 100 MG PO TABS: 100.0000 mg | ORAL_TABLET | Freq: Two times a day (BID) | ORAL | Status: DC

## 2016-05-12 NOTE — Progress Notes (Signed)
Physical Therapy Treatment Patient Details Name: Edward [name] MRN: [id number] DOB: 1971/03/12 Today's Date: 05/12/2016    History of Present Illness Pt is a 45 y.o. male with hx of DM and gangrene L foot. He was admitted for sepsis resulting from osteomyelitis L foot ulcer. Pt underwent L transmetatarsal amputation 05-10-16.    PT Comments    Patient making progress with mobility and gait.    Follow Up Recommendations  Home health PT;Supervision - Intermittent     Equipment Recommendations  Rolling walker with 5" wheels;3in1 (PT);Wheelchair (measurements PT);Wheelchair cushion (measurements PT)    Recommendations for Other Services       Precautions / Restrictions Precautions Precautions: Fall;Other (comment) Precaution Comments: wound vac Required Braces or Orthoses: Other Brace/Splint Other Brace/Splint: post op shoe Restrictions Weight Bearing Restrictions: Yes LLE Weight Bearing: Non weight bearing    Mobility  Bed Mobility Overal bed mobility: Modified Independent                Transfers Overall transfer level: Needs assistance Equipment used: Rolling walker (2 wheeled) Transfers: Sit to/from Stand Sit to Stand: Min assist         General transfer comment: verbal cues for hand placement and sequencing, assist to power up  Ambulation/Gait Ambulation/Gait assistance: Min assist Ambulation Distance (Feet): 44 Feet Assistive device: Rolling walker (2 wheeled) Gait Pattern/deviations: Step-to pattern;Decreased stride length (Hop-to on RLE) Gait velocity: decreased Gait velocity interpretation: Below normal speed for age/gender General Gait Details: Patient able to maintain NWB on LLE.  Patient with loss of balance x1 requiring assist to prevent fall.  Lt foot touched floor during this episode. No aparent injury.  RN notified.   Stairs            Wheelchair Mobility    Modified Rankin (Stroke Patients Only)       Balance Overall balance  assessment: Needs assistance         Standing balance support: Bilateral upper extremity supported Standing balance-Leahy Scale: Poor                      Cognition Arousal/Alertness: Awake/alert Behavior During Therapy: WFL for tasks assessed/performed Overall Cognitive Status: Within Functional Limits for tasks assessed                      Exercises      General Comments        Pertinent Vitals/Pain Pain Assessment: 0-10 Pain Score: 5  Pain Location: Lt foot Pain Descriptors / Indicators: Sore;Throbbing Pain Intervention(s): Monitored during session;Repositioned;Patient requesting pain meds-RN notified    Home Living                      Prior Function            PT Goals (current goals can now be found in the care plan section) Progress towards PT goals: Progressing toward goals    Frequency    Min 3X/week      PT Plan Current plan remains appropriate    Co-evaluation             End of Session Equipment Utilized During Treatment: Gait belt Activity Tolerance: Patient tolerated treatment well Patient left: in bed;with call bell/phone within reach;with family/visitor present     Time: 1209-1227 PT Time Calculation (min) (ACUTE ONLY): 18 min  Charges:  $Gait Training: 8-22 mins  G Codes:      Despina Pole 05/12/2016, 12:56 PM Carita Pian. Sanjuana Kava, [place] Pager [phone]

## 2016-05-12 NOTE — Progress Notes (Signed)
[facility] PROGRESS NOTE  Edward [name] ZOX:[id number] DOB: 05/23/1971 DOA: 05/07/2016 PCP: [name], MD  Interim summary and HPI 45 y.o. male with history of diabetes mellitus type 2 with previous history of Fournier's gangrene in 2013 presents to the ER because of not feeling well last few days. Denies any chest pain or shortness of breath nausea vomiting or diarrhea. In the ER patient was found to have fever 104F with tachycardia and elevated lactate. Blood sugar was also elevated. Chest x-ray does not show any infiltrates. Patient has chronic wound on his left foot on the plantar aspect and patient states that the discharge from the left foot wound has recently worsened and his left foot is also swollen more than baseline.   Assessment/Plan: 1-Sepsis: due to left foot osteomyelitis  -will continue broad spectrum antibiotics for 48 hours after amputation; then will switch to doxycycline and provide suppressed therapy with another 8 days of antibiotics  -s/p transmetatarsal amputation (9/22); well tolerated -will follow rec's from orthopedic service  -continue supportive care -follow cx data -will follow PT/OT rec's  2-diabetic foot ulcer with osteomyelitis  -as mentioned above -will continue IV antibiotics for 48 hours past amputation; then planning 8 days of doxycycline  -patient is s/p transmetatarsal amputation, well tolerated and in no major distress.  -will follow ortho rec's  3-uncontrolled diabetes -will continue SSI and lantus -continue adjusting insulin as needed for better control. Lantus increased to 30 units. -will follow CBG's fluctuation; infection contributing to it  -continue modified carb diet   4-HTN -stable currently -will continue current antihypertensive regimen   5-hyponatremia: -due to hyperglycemia -continue insulin treatment and IVF's -will follow up electrolytes trend; but essentially resolved now   6-AKI: due to dehydration and  sepsis -will continue IVF's, but rate adjusted to maintenance now -follow renal function trend  -last Cr 1.14 -advise to keep good hydration   Code Status: Full Family Communication: no family at bedside  Disposition Plan: remains inpatient, continue IV antibiotics for 48 hours after surgery (last day today), will follow orthopedic service recommendations as part of wound care. Most likely home tomorrow.   Consultants:  Dr. Sharol Given (orthopedic service)  Procedures:  See below for x-ray results (MRI demonstrating left metatarsal osteomyelitis)   Transmetatarsal amputation 9/22  Antibiotics:  Vancomycin 9/19   Zosyn 9/19  HPI/Subjective: Denies CP and SOB. Patient is status post amputation (transmetatarsal amp, left foot). No fever  Objective: Vitals:   05/12/16 0546 05/12/16 1242  BP: (!) 143/94 135/78  Pulse: 76 83  Resp: 18 20  Temp: 98.5 F (36.9 C) 98.5 F (36.9 C)    Intake/Output Summary (Last 24 hours) at 05/12/16 1455 Last data filed at 05/12/16 1433  Gross per 24 hour  Intake             1240 ml  Output              700 ml  Net              540 ml   Filed Weights   05/10/16 0524 05/11/16 0617 05/12/16 0546  Weight: 101.9 kg (224 lb 11.2 oz) 115.4 kg (254 lb 8 oz) 103.1 kg (227 lb 3.2 oz)    Exam:   General:  Afebrile, status post transmetatarsal amputation; no CP or SOB. reports pain is well controlled. Patient with slight decrease balance while using walker today. Still has not been cleared by surgery to bear weight.  Cardiovascular: S1 and s2, no  rubs, no gallops, no murmrus  Respiratory: good air movement, no wheezing   Abdomen: soft, NT, ND, positive BS  Musculoskeletal: no joint swelling, FROM. Left foot with transmetatarsal amputation. wound vac in place.   Data Reviewed: Basic Metabolic Panel:  Recent Labs Lab 05/07/16 1609 05/08/16 0358 05/10/16 0329 05/12/16 0218  NA 129* 129* 131* 133*  K 5.1 4.7 3.9 4.0  CL 96* 99* 100* 102   CO2 19* 17* 20* 23  GLUCOSE 339* 418* 275* 186*  BUN 23* 26* 13 10  CREATININE 1.70* 1.79* 1.22 1.14  CALCIUM 9.2 7.9* 8.2* 8.1*   Liver Function Tests:  Recent Labs Lab 05/07/16 1609 05/08/16 0358  AST 22 19  ALT 19 17  ALKPHOS 69 58  BILITOT 1.1 1.2  PROT 7.7 5.8*  ALBUMIN 3.3* 2.4*   CBC:  Recent Labs Lab 05/07/16 1609 05/08/16 0358 05/10/16 0329 05/12/16 0218  WBC 10.7* 11.5* 8.4 8.6  NEUTROABS 9.3* 9.7*  --   --   HGB 15.4 12.2* 11.4* 10.5*  HCT 46.4 37.2* 35.3* 33.5*  MCV 84.4 84.0 83.8 84.6  PLT 213 174 202 252   Cardiac Enzymes:  Recent Labs Lab 05/08/16 0900  TROPONINI 0.03*   CBG:  Recent Labs Lab 05/11/16 1133 05/11/16 1617 05/11/16 2048 05/12/16 0539 05/12/16 1159  GLUCAP 289* 230* 199* 188* 167*   Studies: No results found.  Scheduled Meds: . insulin aspart  0-9 Units Subcutaneous TID WC  . insulin aspart  5 Units Subcutaneous TID WC  . insulin glargine  30 Units Subcutaneous Daily  . piperacillin-tazobactam (ZOSYN)  IV  3.375 g Intravenous Q8H  . vancomycin  750 mg Intravenous Q12H   Continuous Infusions: . sodium chloride 10 mL/hr at 05/10/16 1800  . lactated ringers 10 mL/hr at 05/10/16 1504    Principal Problem:   Sepsis ([place]) Active Problems:   Type 2 diabetes mellitus with left diabetic foot ulcer ([place])   ARF (acute renal failure) ([place])   Diabetic ulcer of left foot ([place])    Time spent: 30 minutes    Barton Dubois  [facility] Pager [phone]. If 7PM-7AM, please contact night-coverage at [email or web address], password [facility] 05/12/2016, 2:55 PM  LOS: 5 days

## 2016-05-13 ENCOUNTER — Ambulatory Visit: Payer: BLUE CROSS/BLUE SHIELD | Admitting: Podiatry

## 2016-05-13 ENCOUNTER — Encounter (HOSPITAL_COMMUNITY): Payer: Self-pay | Admitting: Orthopedic Surgery

## 2016-05-13 DIAGNOSIS — L97524 Non-pressure chronic ulcer of other part of left foot with necrosis of bone: Secondary | ICD-10-CM

## 2016-05-13 LAB — CULTURE, BLOOD (ROUTINE X 2): CULTURE: NO GROWTH

## 2016-05-13 LAB — GLUCOSE, CAPILLARY: Glucose-Capillary: 227 mg/dL — ABNORMAL HIGH (ref 65–99)

## 2016-05-13 MED ORDER — BLOOD GLUCOSE METER KIT: PACK | 0 refills | Status: DC

## 2016-05-13 MED ORDER — INSULIN GLARGINE 100 UNIT/ML SOLOSTAR PEN: 40.0000 [IU] | PEN_INJECTOR | Freq: Every day | SUBCUTANEOUS | 3 refills | Status: DC

## 2016-05-13 MED ORDER — INSULIN PEN NEEDLE 31G X 5 MM MISC: 2 refills | Status: DC

## 2016-05-13 MED ORDER — METHOCARBAMOL 500 MG PO TABS: 500.0000 mg | ORAL_TABLET | Freq: Four times a day (QID) | ORAL | 0 refills | Status: DC | PRN

## 2016-05-13 MED ORDER — ACETAMINOPHEN 325 MG PO TABS: 650.0000 mg | ORAL_TABLET | Freq: Four times a day (QID) | ORAL | 0 refills | Status: DC | PRN

## 2016-05-13 MED ORDER — OXYCODONE HCL 5 MG PO TABS: 5.0000 mg | ORAL_TABLET | Freq: Four times a day (QID) | ORAL | 0 refills | Status: DC | PRN

## 2016-05-13 NOTE — Anesthesia Postprocedure Evaluation (Signed)
Anesthesia Post Note  Patient: Edward [name]  Procedure(s) Performed: Procedure(s) (LRB): Left Transmetatarsal Amputation (Left)  Patient location during evaluation: PACU Anesthesia Type: Regional Level of consciousness: awake Pain management: pain level controlled Vital Signs Assessment: post-procedure vital signs reviewed and stable Respiratory status: spontaneous breathing Cardiovascular status: stable Postop Assessment: no signs of nausea or vomiting Anesthetic complications: no    Last Vitals:  Vitals:   05/12/16 2119 05/13/16 0553  BP: (!) 146/84 (!) 144/89  Pulse: 88 82  Resp: 18 19  Temp: 37.3 C 36.9 C    Last Pain:  Vitals:   05/13/16 0553  TempSrc: Oral  PainSc:                  Edward [name]

## 2016-05-13 NOTE — Progress Notes (Signed)
Patient ID: Edward [name], male   DOB: 09-Jun-1971, 45 y.o.   MRN: [id number] Postoperative day 3 left transmetatarsal amputation. Patient is comfortable, wound VAC is functioning well. Patient will discontinue the wound VAC when it alarms and stops working. At that time he may apply dry dressing and Ace wrap and start Dial soap cleansing. I will follow-up in the office in 1 week. Patient may be discharged from an orthopedic standpoint at this time. Patient does not need oral antibiotics for discharge to home.

## 2016-05-13 NOTE — Discharge Summary (Signed)
Physician Discharge Summary  Edward [name] YCX:[id number] DOB: 1970-12-14 DOA: 05/07/2016  PCP: [name], MD  Admit date: 05/07/2016 Discharge date: 05/13/2016  Time spent: 35 minutes  Recommendations for Outpatient Follow-up:  1. Repeat BMET to follow up electrolytes and renal function  2. Close follow up to his CBG's and diabetes (A1C 15.5); adjust hypoglycemic regimen as needed  3. Reassess BP and start antihypertensive regimen if needed    Discharge Diagnoses:  Sepsis ([place]) Diabetic osteomyelitis ([place]) AKI ([place]) Diabetic ulcer of left foot ([place]) Uncontrolled diabetes with diabetic foot ulcer/osteomyelitis Elevated BP (not on antihypertensive regimen on admission)   Discharge Condition: stable and improved. Discharge home with [facility] services. Patient will follow up with PCP in 2 weeks and with orthopedic surgery in 1 week.   Diet recommendation: modified carbohydrates diet   Filed Weights   05/11/16 0617 05/12/16 0546 05/13/16 0553  Weight: 115.4 kg (254 lb 8 oz) 103.1 kg (227 lb 3.2 oz) 103.9 kg (229 lb)    History of present illness:  45 y.o.malewith history of diabetes mellitus type 2 with previous history of Fournier's gangrene in 2013 presents to the ER because of not feeling well last few days. Denies any chest pain or shortness of breath nausea vomiting or diarrhea. In the ER patient was found to have fever 104F with tachycardia and elevated lactate. Blood sugar was also elevated. Chest x-ray does not show any infiltrates. Patient has chronic wound on his left foot on the plantar aspect and patient states that the discharge from the left foot wound has recently worsened and his left foot is also swollen more than baseline.   Hospital Course:  1-Sepsis: due to left foot diabetic osteomyelitis  -sepsis features resolved completely  -after discussing with orthopedic service no need for oral antibiotics at discharge -patient advise to continue no weight bearing and  instructions for wound care -follow up in 1 week with Dr. Sharol Given after discharge -PRN oxycodone for pain control -HH PT and [place] aid arranged at discharge -s/p transmetatarsal amputation (9/22); well tolerated  2-diabetic foot ulcer with osteomyelitis  -S/P transmetatarsal amputation -pain well controlled -wound care as instructed by orthopedic service -will follow up with Dr. Sharol Given in 1 week -no weight bearing and continue wound vac as instructed   3-uncontrolled diabetes -will discharge on lantus and metformin  -patient diabetes is uncontrolled. -A1C >15.5 -will need close follow up and further adjustment to his hypoglycemic regimen as needed  -advise to follow low carb diet  4-HTN -stable currently -advise to follow low sodium diet -not on any antihypertensive agent currently -will need follow up with PCP and initiation of treatment as needed   5-dehydration/hyponatremia: -due to hyperglycemia and dehydration -continue insulin treatment and good hydration -will recommend follow up electrolytes trend at his outpatient visit  6-AKI: due to dehydration and sepsis -resolved with fluid resuscitation -due to sepsis and poor perfusion with dehydration most likely  -follow renal function trend at follow up visit -last Cr 1.14 -advise to keep good hydration   Procedures:  See below for x-ray results (MRI demonstrating left metatarsal osteomyelitis)   Transmetatarsal amputation 9/22  Consultations:  Orthopedic service (Dr. Sharol Given)  Discharge Exam: Vitals:   05/12/16 2119 05/13/16 0553  BP: (!) 146/84 (!) 144/89  Pulse: 88 82  Resp: 18 19  Temp: 99.1 F (37.3 C) 98.4 F (36.9 C)    General:  Afebrile, status post transmetatarsal amputation; no CP or SOB. reports pain is well controlled overall. Patient  with slight decrease balance while using walker; but good to go home with [facility] services as per physical therapy evaluation. No weight bearing advised by surgical  service.  Cardiovascular: S1 and s2, no rubs, no gallops, no murmrus  Respiratory: good air movement, no wheezing   Abdomen: soft, NT, ND, positive BS  Musculoskeletal: no joint swelling, FROM. Left foot with transmetatarsal amputation. wound vac in place.    Discharge Instructions   Discharge Instructions    Discharge instructions    Complete by:  As directed    Take medications as prescribed  Follow low carbohydrates diet Please make sure you follow with orthopedic service in 1 week Arrange follow up with PCP in 14 days Keep yourself well hydrated   Neg Press Wound Therapy / Incisional    Complete by:  As directed    Discontinue the wound VAC and dressing when it alarms and stops working. May start Dial soap cleansing and dry dressing changes daily with gauze and Ace wrap.   Non weight bearing    Complete by:  As directed    Laterality:  left   Extremity:  Lower     Current Discharge Medication List    START taking these medications   Details  acetaminophen (TYLENOL) 325 MG tablet Take 2 tablets (650 mg total) by mouth every 6 (six) hours as needed for mild pain (or Fever >/= 101). Qty: 40 tablet, Refills: 0    blood glucose meter kit and supplies Dispense based on patient and insurance preference. Use up to four times daily as directed. (FOR ICD-9 250.00, 250.01). Qty: 1 each, Refills: 0    Insulin Glargine (LANTUS) 100 UNIT/ML Solostar Pen Inject 40 Units into the skin daily at 10 pm. Qty: 15 mL, Refills: 3    Insulin Pen Needle 31G X 5 MM MISC Use daily to inject insulin as instructed Qty: 100 each, Refills: 2    methocarbamol (ROBAXIN) 500 MG tablet Take 1 tablet (500 mg total) by mouth every 6 (six) hours as needed for muscle spasms. Qty: 30 tablet, Refills: 0    oxyCODONE (OXY IR/ROXICODONE) 5 MG immediate release tablet Take 1-2 tablets (5-10 mg total) by mouth every 6 (six) hours as needed for severe pain. Qty: 30 tablet, Refills: 0      CONTINUE these  medications which have NOT CHANGED   Details  metFORMIN (GLUCOPHAGE) 1000 MG tablet Take 1 tablet (1,000 mg total) by mouth 2 (two) times daily. Qty: 60 tablet, Refills: 2      STOP taking these medications     sitaGLIPtin-metformin (JANUMET) 50-1000 MG per tablet      amoxicillin-clavulanate (AUGMENTIN) 875-125 MG tablet      cephALEXin (KEFLEX) 500 MG capsule      ciprofloxacin (CIPRO) 500 MG tablet      clindamycin (CLEOCIN) 300 MG capsule      collagenase (SANTYL) ointment      HYDROcodone-acetaminophen (NORCO/VICODIN) 5-325 MG tablet      silver sulfADIAZINE (SILVADENE) 1 % cream      sitaGLIPtin-metformin (JANUMET) 50-1000 MG tablet        Allergies  Allergen Reactions  . No Known Allergies    Follow-up Information    [name], MD Follow up in 1 week(s).   Specialty:  Orthopedic Surgery Contact information: [place] [address] [phone]        [facility] .   Why:  home health physical therapy Contact information: [place]  [address] [place] [address] [place] .   Why:  wheelchair, 3n1 and rolling walker Contact information: [address] [place] [address] [phone]        [name], MD. Schedule an appointment as soon as possible for a visit in 46 day(s).   Specialty:  Family Medicine Contact information: [address] [place] [address] [phone]           The results of significant diagnostics from this hospitalization (including imaging, microbiology, ancillary and laboratory) are listed below for reference.    Significant Diagnostic Studies: Dg Chest 2 View  Result Date: 05/07/2016 CLINICAL DATA:  Fever and vomiting today. EXAM: CHEST  2 VIEW COMPARISON:  Chest radiograph July 28, 2011 FINDINGS: Cardiomediastinal silhouette is normal. Mild bronchitic changes. No pleural effusions or focal  consolidations. Trachea projects midline and there is no pneumothorax. Soft tissue planes and included osseous structures are non-suspicious. Mild degenerative change of the thoracic spine. IMPRESSION: Mild bronchitic changes without focal consolidation. Electronically Signed   By: Elon Alas M.D.   On: 05/07/2016 18:57   Mr Foot Left Wo Contrast  Result Date: 05/08/2016 CLINICAL DATA:  Diabetic patient with a chronic ulceration on the plantar surface of the foot deep to the third and fourth toes. Fever and chills for 3 days. EXAM: MRI OF THE LEFT FOOT WITHOUT CONTRAST TECHNIQUE: Multiplanar, multisequence MR imaging was performed. No intravenous contrast was administered. COMPARISON:  [name] films left foot 05/07/2016. FINDINGS: A skin ulceration is seen on the plantar surface of the foot at the level of the third and fourth MTP joints. No focal fluid collection is identified with diffuse subcutaneous edema about the foot noted. Marrow edema is seen throughout the proximal phalanges of the third and fourth toes. Mild marrow edema is seen in the plantar surface of the heads of both the third and fourth metatarsals. Bone marrow signal is otherwise unremarkable. No joint effusion is identified. Increased T2 signal throughout all intrinsic musculature of the foot could be due to myositis but likely reflects changes of early atrophy. IMPRESSION: Large skin ulceration on the plantar surface of the foot at the level the third and fourth MTP joints. Marrow edema in the plantar aspect of the heads of the third and fourth metatarsals and throughout the proximal phalanges of the third and fourth toes is compatible with osteomyelitis. Negative for abscess or septic joint. Increased T2 signal throughout all intrinsic musculature the foot likely reflects changes of early atrophy rather than myositis. Electronically Signed   By: Inge Rise M.D.   On: 05/08/2016 09:32   Dg Foot Complete Left  Result Date:  05/07/2016 CLINICAL DATA:  LEFT foot ulcer.  History of diabetes. EXAM: LEFT FOOT - COMPLETE 3+ VIEW COMPARISON:  LEFT foot radiograph April 29, 2016 FINDINGS: No acute fracture deformity or dislocation. Foreshortened second metatarsus is likely developmental. Pes plana suspected though limited assessment by nonweightbearing radiographs. Soft tissue irregularity medial plantar aspect forefoot with soft tissue swelling, no subcutaneous gas or radiopaque foreign bodies. Mild vascular calcifications. IMPRESSION: Medial plantar skin irregularity consistent with history of ulcer, no subcutaneous gas or radiographic findings of osteomyelitis. Electronically Signed   By: Elon Alas M.D.   On: 05/07/2016 19:01    Microbiology: Recent Results (from the past 240 hour(s))  Blood Culture (routine x 2)     Status: None (Preliminary result)   Collection  Time: 05/07/16  5:40 PM  Result Value Ref Range Status   Specimen Description BLOOD RIGHT ANTECUBITAL  Final   Special Requests IN PEDIATRIC BOTTLE 4CC  Final   Culture NO GROWTH 4 DAYS  Final   Report Status PENDING  Incomplete  Blood Culture (routine x 2)     Status: None   Collection Time: 05/07/16  5:55 PM  Result Value Ref Range Status   Specimen Description BLOOD RIGHT HAND  Final   Special Requests BOTTLES DRAWN AEROBIC AND ANAEROBIC 5CC  Final   Culture NO GROWTH 5 DAYS  Final   Report Status 05/12/2016 FINAL  Final  Urine culture     Status: None   Collection Time: 05/08/16  2:21 AM  Result Value Ref Range Status   Specimen Description URINE, RANDOM  Final   Special Requests NONE  Final   Culture NO GROWTH  Final   Report Status 05/09/2016 FINAL  Final  Surgical pcr screen     Status: None   Collection Time: 05/10/16  4:52 AM  Result Value Ref Range Status   MRSA, PCR NEGATIVE NEGATIVE Final   Staphylococcus aureus NEGATIVE NEGATIVE Final    Comment:        The Xpert SA Assay (FDA approved for NASAL specimens in patients over  27 years of age), is one component of a comprehensive surveillance program.  Test performance has been validated by [facility] for patients greater than or equal to 66 year old. It is not intended to diagnose infection nor to guide or monitor treatment.      Labs: Basic Metabolic Panel:  Recent Labs Lab 05/07/16 1609 05/08/16 0358 05/10/16 0329 05/12/16 0218  NA 129* 129* 131* 133*  K 5.1 4.7 3.9 4.0  CL 96* 99* 100* 102  CO2 19* 17* 20* 23  GLUCOSE 339* 418* 275* 186*  BUN 23* 26* 13 10  CREATININE 1.70* 1.79* 1.22 1.14  CALCIUM 9.2 7.9* 8.2* 8.1*   Liver Function Tests:  Recent Labs Lab 05/07/16 1609 05/08/16 0358  AST 22 19  ALT 19 17  ALKPHOS 69 58  BILITOT 1.1 1.2  PROT 7.7 5.8*  ALBUMIN 3.3* 2.4*   CBC:  Recent Labs Lab 05/07/16 1609 05/08/16 0358 05/10/16 0329 05/12/16 0218  WBC 10.7* 11.5* 8.4 8.6  NEUTROABS 9.3* 9.7*  --   --   HGB 15.4 12.2* 11.4* 10.5*  HCT 46.4 37.2* 35.3* 33.5*  MCV 84.4 84.0 83.8 84.6  PLT 213 174 202 252   Cardiac Enzymes:  Recent Labs Lab 05/08/16 0900  TROPONINI 0.03*   CBG:  Recent Labs Lab 05/12/16 0539 05/12/16 1159 05/12/16 1656 05/12/16 2126 05/13/16 0640  GLUCAP 188* 167* 167* 191* 227*    Signed:  Barton Dubois MD.  [facility] 05/13/2016, 9:14 AM

## 2016-05-13 NOTE — Progress Notes (Signed)
  RD consulted for nutrition education regarding diabetes.   45 y.o.malewith history of diabetes mellitus type 2 with previous history of Fournier's gangrene in 2013 presents to the ER because of not feeling well last few days. Transmetatarsal amputation 9/22.   Lab Results  Component Value Date   HGBA1C >15.5 (H) 05/08/2016    RD provided "Carbohydrate Counting for People with Diabetes" handout from the Academy of Nutrition and Dietetics. Discussed different food groups and their effects on blood sugar, emphasizing carbohydrate-containing foods. Provided list of carbohydrates and recommended serving sizes of common foods.  Discussed importance of controlled and consistent carbohydrate intake throughout the day. Provided examples of ways to balance meals/snacks and encouraged intake of high-fiber, whole grain complex carbohydrates. Also discussed the importance of healthful eating for wound healing. Teach back method used. Pt states that he was drinking regular soda PTA and plans to switch to diet. He also reports plans to stop smoking and, once his foot is healed, exercise more. He denies any additional questions or concerns at this time.   Expect good compliance.  Body mass index is 30.21 kg/m. Pt meets criteria for Obesity based on current BMI.  Current diet order is Carb Modified, patient is consuming approximately 100% of meals at this time. Labs and medications reviewed. No further nutrition interventions warranted at this time. RD contact information provided. If additional nutrition issues arise, please re-consult RD.  Scarlette Ar RD, CSP, LDN Inpatient Clinical Dietitian Pager: [phone] After Hours Pager: [phone]

## 2016-05-13 NOTE — Progress Notes (Signed)
[facility] called and is aware of discharge home today; all DME has been delivered to the pt bedside; B Pennie Rushing [phone]

## 2016-05-13 NOTE — Progress Notes (Signed)
Patient discharge education provided and verbalized by patient.Patient provided with note for work, prescriptions, 3 in1 , walker, and wheelchair upon discharge. IV removed.  Betha Loa Kaeleigh Westendorf, RN

## 2016-05-14 ENCOUNTER — Ambulatory Visit (INDEPENDENT_AMBULATORY_CARE_PROVIDER_SITE_OTHER): Payer: BLUE CROSS/BLUE SHIELD | Admitting: Podiatry

## 2016-05-14 ENCOUNTER — Encounter: Payer: Self-pay | Admitting: Podiatry

## 2016-05-14 DIAGNOSIS — E11621 Type 2 diabetes mellitus with foot ulcer: Secondary | ICD-10-CM

## 2016-05-14 DIAGNOSIS — L97529 Non-pressure chronic ulcer of other part of left foot with unspecified severity: Secondary | ICD-10-CM

## 2016-05-14 NOTE — Progress Notes (Signed)
Patient presents today for two-week follow-up appointment of left foot ulceration. However since his last appointment he did have a transmetatarsal amputation done by Dr. Sharol Given. As an appointment tomorrow with Dr. Sharol Given and he has a wound VAC in place today. Denies any acute changes. Denies any systemic complaints as fevers, chills, nausea, vomiting. Denies any open sores or swelling or redness or drainage to his right foot. I will have him continue to follow with Dr. Sharol Given and I will see him back in 3 months for routine care for diabetic risk check on his right foot. In the meantime call any questions or concerns or any changes symptoms.

## 2016-05-30 ENCOUNTER — Ambulatory Visit (INDEPENDENT_AMBULATORY_CARE_PROVIDER_SITE_OTHER): Payer: BLUE CROSS/BLUE SHIELD | Admitting: Orthopedic Surgery

## 2016-05-30 DIAGNOSIS — L02612 Cutaneous abscess of left foot: Secondary | ICD-10-CM

## 2016-05-30 DIAGNOSIS — Z89432 Acquired absence of left foot: Secondary | ICD-10-CM

## 2016-05-30 DIAGNOSIS — E1142 Type 2 diabetes mellitus with diabetic polyneuropathy: Secondary | ICD-10-CM

## 2016-06-17 ENCOUNTER — Telehealth (INDEPENDENT_AMBULATORY_CARE_PROVIDER_SITE_OTHER): Payer: Self-pay

## 2016-06-17 NOTE — Telephone Encounter (Signed)
[facility] called and left a message on my voice mail. She states that the pts wound looks good but there is a visible suture that was not there before. She wanted to know if the pt needed to make an appt to have this removed in the office. She also reported that the pt's HR was elevated at  bpm at rest but that it had been running above the  90s range since seeing the pt and that she encouraged the pt to call the PCP and also taught him how to check it.

## 2016-06-18 NOTE — Telephone Encounter (Signed)
I called and advised that this pt was s/p a transmet amputation and that this was a retained stitch. They removed in office on 05/30/16 and this was one that was missed. Advised ok per Dr. Sharol Given to remove this at next visit.  The incision is slightly open and pt was applying a dry dressing and ok per MD to apply otc abx oint to dressing and change daily.

## 2016-06-19 ENCOUNTER — Telehealth (INDEPENDENT_AMBULATORY_CARE_PROVIDER_SITE_OTHER): Payer: Self-pay | Admitting: Radiology

## 2016-06-19 NOTE — Telephone Encounter (Signed)
Patients mother calling states stitch was left in incision. Would like to know if the patient can come in asap to get this removed, or if an order can be put in for home health to come back and take stitch out. Please call patient at [phone]. Thanks.

## 2016-06-20 NOTE — Telephone Encounter (Signed)
Called patients mother and advised per last message that it was okay to leave last stitch in foot  until next office visit 06/27/16.

## 2016-06-25 ENCOUNTER — Telehealth (INDEPENDENT_AMBULATORY_CARE_PROVIDER_SITE_OTHER): Payer: Self-pay | Admitting: *Deleted

## 2016-06-25 NOTE — Telephone Encounter (Signed)
[facility] is discharging pt from [facility]. He has met his goal.

## 2016-06-27 ENCOUNTER — Ambulatory Visit (INDEPENDENT_AMBULATORY_CARE_PROVIDER_SITE_OTHER): Payer: BLUE CROSS/BLUE SHIELD | Admitting: Orthopedic Surgery

## 2016-06-27 ENCOUNTER — Encounter (INDEPENDENT_AMBULATORY_CARE_PROVIDER_SITE_OTHER): Payer: Self-pay | Admitting: Orthopedic Surgery

## 2016-06-27 VITALS — Ht 73.0 in | Wt 229.0 lb

## 2016-06-27 DIAGNOSIS — Z89432 Acquired absence of left foot: Secondary | ICD-10-CM

## 2016-06-27 NOTE — Progress Notes (Signed)
Wound Care Note   Patient: Edward [name]           Date of Birth: 1971/03/01           MRN: [id number]             PCP: [name], MD Visit Date: 06/27/2016   Assessment & Plan: Visit Diagnoses:  1. Status post transmetatarsal amputation of foot, left ([place])     Plan: Advance to extra-depth shoe plus custom orthotic plus carbon plate to be fabricated a [employer]. Patient is given a note to be out of work.   Follow-Up Instructions: Return in about 4 weeks (around 07/25/2016).  Orders:  No orders of the defined types were placed in this encounter.  No orders of the defined types were placed in this encounter.     Procedures: No notes on file   Clinical Data: No additional findings.   No images are attached to the encounter.   Subjective: Chief Complaint  Patient presents with  . Left Foot - Routine Post Op    05/10/16 left transmetatarsal amputation     Patient is 7 weeks s/p left foot transmetatarsal amputation. Some small openings along the incision line. Using neosporin and an ace bandage to treat. Post op shoe and weight bearing through the heel ambulates with cane.    Review of Systems  Miscellaneous:  -Home Health Care: [facility] 3 x q week  -Out of Work?: yes  -Additional Information: Patient has met with [employer] and due to being out of work will wait on the extra depth shoe and orthotic.   Objective: Vital Signs: Ht 6' 1"  (1.854 m)   Wt 229 lb (103.9 kg)   BMI 30.21 kg/m   Physical Exam: On examination incision is healing nicely there is a wound less then 10 mm in diameter laterally he will continue with antibiotic ointment and dressing changes is no redness no synovitis no drainage no odor no signs of infection. There is a good granulation tissue at the base of the wound.  Specialty Comments: No specialty comments available.   PMFS History: Patient Active Problem List   Diagnosis Date Noted  . Sepsis ([place]) 05/07/2016  . ARF (acute renal failure)  ([place]) 05/07/2016  . Diabetic ulcer of left foot ([place]) 05/07/2016  . Ulcer of toe of left foot ([place]) 07/17/2015  . Fournier gangrene 08/16/2011  . Acute respiratory failure ([place]) 08/16/2011  . Septic shock(785.52) 08/16/2011  . Diabetic osteomyelitis ([place]) 08/16/2011   Past Medical History:  Diagnosis Date  . Diabetes mellitus   . Hypertension     Family History  Problem Relation Age of Onset  . Diabetes Other    Past Surgical History:  Procedure Laterality Date  . AMPUTATION Left 05/10/2016   Procedure: Left Transmetatarsal Amputation;  Surgeon: [name], MD;  Location: [facility];  Service: Orthopedics;  Laterality: Left;  . CIRCUMCISION  09/02/2011   Procedure: CIRCUMCISION ADULT;  Surgeon: Molli Hazard, MD;  Location: [facility];  Service: Urology;  Laterality: N/A;  . I&D EXTREMITY  09/02/2011   Procedure: IRRIGATION AND DEBRIDEMENT EXTREMITY;  Surgeon: Theodoro Kos, DO;  Location: [facility];  Service: Plastics;  Laterality: N/A;  . INCISION AND DRAINAGE OF WOUND  08/12/2011   Procedure: IRRIGATION AND DEBRIDEMENT WOUND;  Surgeon: Zenovia Jarred, MD;  Location: [facility];  Service: General;;  perineum  . IRRIGATION AND DEBRIDEMENT ABSCESS  08/12/2011   Procedure: IRRIGATION AND DEBRIDEMENT ABSCESS;  Surgeon: Molli Hazard, MD;  Location: [facility];  Service: Urology;  Laterality: N/A;  irrigation and debridment perineum    . IRRIGATION AND DEBRIDEMENT ABSCESS  08/14/2011   Procedure: MINOR INCISION AND DRAINAGE OF ABSCESS;  Surgeon: Molli Hazard, MD;  Location: [facility];  Service: Urology;  Laterality: N/A;  Perineal Wound Debridement;Placement Bilateral Testicular Thigh Pouches   Social History   Occupational History  . Not on file.   Social History Main Topics  . Smoking status: Current Every Day Smoker    Types: Cigarettes  . Smokeless tobacco: Never Used  . Alcohol use 0.0 oz/week     Comment: has not had alcohol in a  month  . Drug use: No  . Sexual activity: Not on file

## 2016-07-18 ENCOUNTER — Telehealth (INDEPENDENT_AMBULATORY_CARE_PROVIDER_SITE_OTHER): Payer: Self-pay | Admitting: Orthopedic Surgery

## 2016-07-18 NOTE — Telephone Encounter (Signed)
Yes we will get the fax it is pending signature. Will notify when ready. Lm on vm to advise.

## 2016-07-25 ENCOUNTER — Ambulatory Visit (INDEPENDENT_AMBULATORY_CARE_PROVIDER_SITE_OTHER): Payer: BLUE CROSS/BLUE SHIELD | Admitting: Orthopedic Surgery

## 2016-07-25 ENCOUNTER — Encounter (INDEPENDENT_AMBULATORY_CARE_PROVIDER_SITE_OTHER): Payer: Self-pay | Admitting: Orthopedic Surgery

## 2016-07-25 VITALS — Ht 73.0 in | Wt 229.0 lb

## 2016-07-25 DIAGNOSIS — Z89432 Acquired absence of left foot: Secondary | ICD-10-CM | POA: Insufficient documentation

## 2016-07-25 DIAGNOSIS — L97421 Non-pressure chronic ulcer of left heel and midfoot limited to breakdown of skin: Secondary | ICD-10-CM | POA: Diagnosis not present

## 2016-07-25 DIAGNOSIS — R2689 Other abnormalities of gait and mobility: Secondary | ICD-10-CM | POA: Insufficient documentation

## 2016-07-25 NOTE — Progress Notes (Signed)
Wound Care Note   Patient: Edward [name]           Date of Birth: 1971-07-04           MRN: [id number]             PCP: [name], MD Visit Date: 07/25/2016   Assessment & Plan: Visit Diagnoses:  1. S/P transmetatarsal amputation of foot, left ([place])   2. Midfoot ulcer, left, limited to breakdown of skin ([place])   3. Balance problem     Plan: Left transmetatarsal ulcer debrided of skin and soft tissue. Patient has recent falls due to difficulty with balance we have given her prescription for [employer] for a double upright brace for his left shoe to help improve his balance. Patient is given a note to be out of work for 4 weeks. 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 Patient will follow with Dr. Ninfa Linden for evaluation for left total hip arthroplasty.   Follow-Up Instructions: Return in about 4 weeks (around 08/22/2016).  Orders:  No orders of the defined types were placed in this encounter.  No orders of the defined types were placed in this encounter.     Procedures: No notes on file   Clinical Data: No additional findings.   No images are attached to the encounter.   Subjective: Chief Complaint  Patient presents with  . Left Foot - Routine Post Op    05/10/16 left transmetatarsal amputation     Patient is here for follow up left transmetatarsal amputation 05/10/16. He is doing well overall. He did get extra depth shoes with custom orthotics from [employer], but feels uneasy about his balance. Often times he feel he may fall, ambulates with a cane. He denies any drainage. There is eschar lateral residual limb. He is not currently doing any type of dressing changes. He questions if he should do therapy for gait training.    Review of Systems  Miscellaneous:  -Home Health Care: no  -Physical Therapy: not anymore  -Out of Work?: yes    Objective: Vital Signs: Ht 6\' 1"  (1.854 m)   Wt 229 lb (103.9 kg)   BMI 30.21  kg/m   Physical Exam: Patient is alert oriented no adenopathy well-dressed normal affect numerous to after he does have an antalgic gait use a cane. Examination of the left lower extremity has pain with internal and external rotation of the left hip. Patient's foot is plantigrade he has a Wagner grade 1 ulcer over the transmetatarsal amputation. After informed consent a 10 blade knife was used to debride the skin and soft tissue back to bleeding viable granulation tissue this was touched with silver nitrate a Band-Aid was applied the ulcer is 5 mm in diameter and 3 cm long and 0.1 mm deep. Patient does have good orthotics for shoe there is no cellulitis no drainage no odor no signs of infection.  Specialty Comments: No specialty comments available.   PMFS History: Patient Active Problem List   Diagnosis Date Noted  . S/P transmetatarsal amputation of foot, left ([place]) 07/25/2016  . Midfoot ulcer, left, limited to breakdown of skin ([place]) 07/25/2016  . Balance problem 07/25/2016  . Sepsis ([place]) 05/07/2016  . ARF (acute renal failure) ([place]) 05/07/2016  . Diabetic ulcer of left foot ([place]) 05/07/2016  . Ulcer of toe of left foot ([place]) 07/17/2015  . Fournier gangrene 08/16/2011  . Acute respiratory failure ([place]) 08/16/2011  . Septic shock(785.52) 08/16/2011  .  Diabetic osteomyelitis ([place]) 08/16/2011   Past Medical History:  Diagnosis Date  . Diabetes mellitus   . Hypertension     Family History  Problem Relation Age of Onset  . Diabetes Other    Past Surgical History:  Procedure Laterality Date  . AMPUTATION Left 05/10/2016   Procedure: Left Transmetatarsal Amputation;  Surgeon: [name], MD;  Location: [facility];  Service: Orthopedics;  Laterality: Left;  . CIRCUMCISION  09/02/2011   Procedure: CIRCUMCISION ADULT;  Surgeon: Molli Hazard, MD;  Location: [facility];  Service: Urology;  Laterality: N/A;  . I&D EXTREMITY  09/02/2011   Procedure: IRRIGATION AND  DEBRIDEMENT EXTREMITY;  Surgeon: Theodoro Kos, DO;  Location: [facility];  Service: Plastics;  Laterality: N/A;  . INCISION AND DRAINAGE OF WOUND  08/12/2011   Procedure: IRRIGATION AND DEBRIDEMENT WOUND;  Surgeon: Zenovia Jarred, MD;  Location: [facility];  Service: General;;  perineum  . IRRIGATION AND DEBRIDEMENT ABSCESS  08/12/2011   Procedure: IRRIGATION AND DEBRIDEMENT ABSCESS;  Surgeon: Molli Hazard, MD;  Location: [facility];  Service: Urology;  Laterality: N/A;  irrigation and debridment perineum    . IRRIGATION AND DEBRIDEMENT ABSCESS  08/14/2011   Procedure: MINOR INCISION AND DRAINAGE OF ABSCESS;  Surgeon: Molli Hazard, MD;  Location: [facility];  Service: Urology;  Laterality: N/A;  Perineal Wound Debridement;Placement Bilateral Testicular Thigh Pouches   Social History   Occupational History  . Not on file.   Social History Main Topics  . Smoking status: Current Every Day Smoker    Types: Cigarettes  . Smokeless tobacco: Never Used  . Alcohol use 0.0 oz/week     Comment: has not had alcohol in a month  . Drug use: No  . Sexual activity: Not on file

## 2016-08-01 ENCOUNTER — Encounter (INDEPENDENT_AMBULATORY_CARE_PROVIDER_SITE_OTHER): Payer: Self-pay | Admitting: Orthopedic Surgery

## 2016-08-16 ENCOUNTER — Encounter: Payer: Self-pay | Admitting: Podiatry

## 2016-08-16 ENCOUNTER — Ambulatory Visit (INDEPENDENT_AMBULATORY_CARE_PROVIDER_SITE_OTHER): Payer: BLUE CROSS/BLUE SHIELD | Admitting: Podiatry

## 2016-08-16 DIAGNOSIS — Q828 Other specified congenital malformations of skin: Secondary | ICD-10-CM

## 2016-08-16 DIAGNOSIS — M79674 Pain in right toe(s): Secondary | ICD-10-CM

## 2016-08-16 DIAGNOSIS — E1149 Type 2 diabetes mellitus with other diabetic neurological complication: Secondary | ICD-10-CM

## 2016-08-16 DIAGNOSIS — B351 Tinea unguium: Secondary | ICD-10-CM | POA: Diagnosis not present

## 2016-08-16 DIAGNOSIS — L84 Corns and callosities: Secondary | ICD-10-CM

## 2016-08-16 DIAGNOSIS — M79675 Pain in left toe(s): Secondary | ICD-10-CM

## 2016-08-16 NOTE — Progress Notes (Signed)
Subjective: 45 y.o. returns the office today for painful, elongated, thickened toenails which he cannot trim himself. He also has a callus of the ball of his right foot. Denies any redness or drainage around the nails. He is still under the care of Dr. Sharol Given status post left transmetatarsal amputation. Denies any acute changes since last appointment and no new complaints today. Denies any systemic complaints such as fevers, chills, nausea, vomiting.   Objective: AAO 3, NAD DP/PT pulses palpable, CRT less than 3 seconds Nails hypertrophic, dystrophic, elongated, brittle, discolored 5. There is tenderness overlying the nails 1-5 bilaterally. There is no surrounding erythema or drainage along the nail sites. Hyperkeratotic lesion right foot submetatarsal 5. Upon debridement no underlying ulceration, drainage or other signs of infection. No open lesions or  other pre-ulcerative lesions are identified. No other areas of tenderness bilateral lower extremities. No overlying edema, erythema, increased warmth. No pain with calf compression, swelling, warmth, erythema.  Assessment: Patient presents with symptomatic onychomycosis; Hyperkeratotic lesion   Plan: -Treatment options including alternatives, risks, complications were discussed -Nails sharply debrided 5 without complication/bleeding. -Hyperkeratotic lesion debrided 1 without complications or bleeding.  -Discussed daily foot inspection. If there are any changes, to call the office immediately.  -Follow-up in 3 months or sooner if any problems are to arise. In the meantime, encouraged to call the office with any questions, concerns, changes symptoms.  Celesta Gentile, DPM

## 2016-08-21 ENCOUNTER — Ambulatory Visit (INDEPENDENT_AMBULATORY_CARE_PROVIDER_SITE_OTHER): Payer: BLUE CROSS/BLUE SHIELD | Admitting: Orthopaedic Surgery

## 2016-08-21 ENCOUNTER — Ambulatory Visit (INDEPENDENT_AMBULATORY_CARE_PROVIDER_SITE_OTHER): Payer: Self-pay

## 2016-08-21 DIAGNOSIS — M25552 Pain in left hip: Secondary | ICD-10-CM | POA: Diagnosis not present

## 2016-08-21 DIAGNOSIS — M1611 Unilateral primary osteoarthritis, right hip: Secondary | ICD-10-CM | POA: Diagnosis not present

## 2016-08-21 NOTE — Progress Notes (Signed)
Office Visit Note   Patient: Edward [name]           Date of Birth: [birth year]           MRN: [id number] Visit Date: 08/21/2016              Requested by: [name], MD [address] [place], [place] [address] PCP: [name], MD   Assessment & Plan: Visit Diagnoses:  1. Pain in left hip   2. Pain of left hip joint   3. Unilateral primary osteoarthritis, right hip     Plan: At this point I do feel that he needs a left total hip replacement and I showed her models and gave him a copy of his x-rays and a handout on direct anterior hip replaced surgery. However his hemoglobin A1c said is 10 and is working on better blood glucose control. He sees his primary care physician next week. He's already had his medications altered to help with his blood glucose control. I explained in detail what hip replacement surgery involves and do feel that if he gets his blood glucose under control we can proceed with surgery. He knows I would like to have this number at 8 with his hemoglobin A1c. We'll see him back in 4 weeks to see how blood glucose is doing.  Follow-Up Instructions: Return in about 4 weeks (around 09/18/2016).   Orders:  Orders Placed This Encounter  Procedures  . XR HIP UNILAT W OR W/O PELVIS 2-3 VIEWS LEFT   No orders of the defined types were placed in this encounter.     Procedures: No procedures performed   Clinical Data: No additional findings.   Subjective: Chief Complaint  Patient presents with  . Left Hip - Pain    Has been told in the past he needs hip replacement    HPI This gentleman who ambulates with a crutch. He has severe left hip pain is 10 out of 10. It's detrimentally affects his activities daily living, his quality of life, and his mobility. It hurts in his groin. He said that leg is shorter than the other leg. It's hard to get on shoes and socks. He is in follow-up with Dr. Sharol Given for a transmetatarsal amputation of the left foot. He says that  is healing nicely. He also is a poorly controlled diabetic. Review of Systems Denies any current chest pain, headache, shortness of breath, fever, chills, nausea, vomiting.  Objective: Vital Signs: There were no vitals taken for this visit.  Physical Exam He is alert and oriented 3 in no acute distress. He's ambulate with a crutch. Ortho Exam  Specialty Comments:  No specialty comments available.  Imaging: Xr Hip Unilat W Or W/o Pelvis 2-3 Views Left  Result Date: 08/21/2016 An AP pelvis and a lateral of his left hip show severe end-stage arthritis versus severe avascular necrosis. Essentially has almost complete loss of his joint space. There sclerotic and cystic changes in severe para-articular osteophytes.    PMFS History: Patient Active Problem List   Diagnosis Date Noted  . S/P transmetatarsal amputation of foot, left ([place]) 07/25/2016  . Midfoot ulcer, left, limited to breakdown of skin ([place]) 07/25/2016  . Balance problem 07/25/2016  . Sepsis ([place]) 05/07/2016  . ARF (acute renal failure) ([place]) 05/07/2016  . Diabetic ulcer of left foot ([place]) 05/07/2016  . Ulcer of toe of left foot ([place]) 07/17/2015  . Fournier gangrene 08/16/2011  . Acute respiratory failure ([place]) 08/16/2011  .  Septic shock(785.52) 08/16/2011  . Diabetic osteomyelitis ([place]) 08/16/2011   Past Medical History:  Diagnosis Date  . Diabetes mellitus   . Hypertension     Family History  Problem Relation Age of Onset  . Diabetes Other     Past Surgical History:  Procedure Laterality Date  . AMPUTATION Left 05/10/2016   Procedure: Left Transmetatarsal Amputation;  Surgeon: [name], MD;  Location: [facility];  Service: Orthopedics;  Laterality: Left;  . CIRCUMCISION  09/02/2011   Procedure: CIRCUMCISION ADULT;  Surgeon: Molli Hazard, MD;  Location: [facility];  Service: Urology;  Laterality: N/A;  . I&D EXTREMITY  09/02/2011   Procedure: IRRIGATION AND DEBRIDEMENT EXTREMITY;   Surgeon: Theodoro Kos, DO;  Location: [facility];  Service: Plastics;  Laterality: N/A;  . INCISION AND DRAINAGE OF WOUND  08/12/2011   Procedure: IRRIGATION AND DEBRIDEMENT WOUND;  Surgeon: Zenovia Jarred, MD;  Location: [facility];  Service: General;;  perineum  . IRRIGATION AND DEBRIDEMENT ABSCESS  08/12/2011   Procedure: IRRIGATION AND DEBRIDEMENT ABSCESS;  Surgeon: Molli Hazard, MD;  Location: [facility];  Service: Urology;  Laterality: N/A;  irrigation and debridment perineum    . IRRIGATION AND DEBRIDEMENT ABSCESS  08/14/2011   Procedure: MINOR INCISION AND DRAINAGE OF ABSCESS;  Surgeon: Molli Hazard, MD;  Location: [facility];  Service: Urology;  Laterality: N/A;  Perineal Wound Debridement;Placement Bilateral Testicular Thigh Pouches   Social History   Occupational History  . Not on file.   Social History Main Topics  . Smoking status: Current Every Day Smoker    Types: Cigarettes  . Smokeless tobacco: Never Used  . Alcohol use 0.0 oz/week     Comment: has not had alcohol in a month  . Drug use: No  . Sexual activity: Not on file

## 2016-08-22 ENCOUNTER — Ambulatory Visit (INDEPENDENT_AMBULATORY_CARE_PROVIDER_SITE_OTHER): Payer: BLUE CROSS/BLUE SHIELD | Admitting: Family

## 2016-08-22 ENCOUNTER — Encounter (INDEPENDENT_AMBULATORY_CARE_PROVIDER_SITE_OTHER): Payer: Self-pay | Admitting: Orthopedic Surgery

## 2016-08-22 VITALS — Ht 73.0 in | Wt 229.0 lb

## 2016-08-22 DIAGNOSIS — Z89432 Acquired absence of left foot: Secondary | ICD-10-CM

## 2016-08-22 DIAGNOSIS — R2689 Other abnormalities of gait and mobility: Secondary | ICD-10-CM

## 2016-08-22 MED ORDER — GABAPENTIN 100 MG PO CAPS: 100.0000 mg | ORAL_CAPSULE | Freq: Three times a day (TID) | ORAL | 1 refills | Status: DC

## 2016-08-22 NOTE — Progress Notes (Signed)
Office Visit Note   Patient: Edward [name]           Date of Birth: 02/21/1971           MRN: [id number] Visit Date: 08/22/2016              Requested by: [name], MD [address] [place], [place] [address] PCP: [name], MD  Chief Complaint  Patient presents with  . Left Foot - Routine Post Op    Left transmetatarsal amputation 05/10/16. ~15 weeks post op.   Patient presents for follow up left transmetatarsal amputation 05/10/16. He is 15 weeks post op. His foot is well healed. He continues to have difficulty with his balance. He has fallen a couple times. He is pending surgical intervention for left hip early February. [name], RT  The patient is a 46 year old gentleman who is seen today in follow-up for left transmetatarsal amputation. He is over 4 months out. The amputation has healed well. He is ambulating today in the depth shoes with custom orthotic. States that he continues to have difficulty with his balance has even fallen down. Bleeding with a cane at this time. States Dr. Sharol Given recommended a double upright brace for the left, he hopes that he will need this.  Complains of burning and itching tingling pain to the amputation as well as phantom pain in his toes.    Assessment & Plan: Visit Diagnoses:  1. S/P transmetatarsal amputation of foot, left ([facility])   2. Balance problem     Plan: Have provided a prescription for gabapentin which she will begin taking. We will follow-up in 2 months sooner should any concerns arise  Follow-Up Instructions: Return in about 2 months (around 10/20/2016).   Orders:  No orders of the defined types were placed in this encounter.  Meds ordered this encounter  Medications  . gabapentin (NEURONTIN) 100 MG capsule    Sig: Take 1 capsule (100 mg total) by mouth 3 (three) times daily. Take 1 tablet po hs for 1 week, then bid for 1 week, then tid    Dispense:  90 capsule    Refill:  1      Procedures: No procedures  performed   Clinical Data: No additional findings.   Subjective: Review of Systems  Constitutional: Negative for chills and fever.  Musculoskeletal: Positive for gait problem.  Skin: Negative for wound.     Objective: Vital Signs: Ht 6\' 1"  (1.854 m)   Wt 229 lb (103.9 kg)   BMI 30.21 kg/m   Physical Exam  Constitutional: He is oriented to person, place, and time. He appears well-developed and well-nourished.  Pulmonary/Chest: Effort normal.  Musculoskeletal:  Left transmetatarsal amputation well-healed. Does have minimal swelling over the foot.  Neurological: He is alert and oriented to person, place, and time.  Psychiatric: He has a normal mood and affect.  Nursing note reviewed.   Ortho Exam  Specialty Comments:  No specialty comments available.  Imaging: Xr Hip Unilat W Or W/o Pelvis 2-3 Views Left  Result Date: 08/21/2016 An AP pelvis and a lateral of his left hip show severe end-stage arthritis versus severe avascular necrosis. Essentially has almost complete loss of his joint space. There sclerotic and cystic changes in severe para-articular osteophytes.    PMFS History: Patient Active Problem List   Diagnosis Date Noted  . S/P transmetatarsal amputation of foot, left ([place]) 07/25/2016  . Midfoot ulcer, left, limited to breakdown of skin (  [place]) 07/25/2016  . Balance problem 07/25/2016  . Sepsis ([place]) 05/07/2016  . ARF (acute renal failure) ([place]) 05/07/2016  . Diabetic ulcer of left foot ([place]) 05/07/2016  . Ulcer of toe of left foot ([place]) 07/17/2015  . Fournier gangrene 08/16/2011  . Acute respiratory failure ([place]) 08/16/2011  . Septic shock(785.52) 08/16/2011  . Diabetic osteomyelitis ([place]) 08/16/2011   Past Medical History:  Diagnosis Date  . Diabetes mellitus   . Hypertension     Family History  Problem Relation Age of Onset  . Diabetes Other     Past Surgical History:  Procedure Laterality Date  . AMPUTATION Left 05/10/2016   Procedure: Left  Transmetatarsal Amputation;  Surgeon: [name], MD;  Location: [facility];  Service: Orthopedics;  Laterality: Left;  . CIRCUMCISION  09/02/2011   Procedure: CIRCUMCISION ADULT;  Surgeon: Molli Hazard, MD;  Location: [facility];  Service: Urology;  Laterality: N/A;  . I&D EXTREMITY  09/02/2011   Procedure: IRRIGATION AND DEBRIDEMENT EXTREMITY;  Surgeon: Theodoro Kos, DO;  Location: [facility];  Service: Plastics;  Laterality: N/A;  . INCISION AND DRAINAGE OF WOUND  08/12/2011   Procedure: IRRIGATION AND DEBRIDEMENT WOUND;  Surgeon: Zenovia Jarred, MD;  Location: [facility];  Service: General;;  perineum  . IRRIGATION AND DEBRIDEMENT ABSCESS  08/12/2011   Procedure: IRRIGATION AND DEBRIDEMENT ABSCESS;  Surgeon: Molli Hazard, MD;  Location: [facility];  Service: Urology;  Laterality: N/A;  irrigation and debridment perineum    . IRRIGATION AND DEBRIDEMENT ABSCESS  08/14/2011   Procedure: MINOR INCISION AND DRAINAGE OF ABSCESS;  Surgeon: Molli Hazard, MD;  Location: [facility];  Service: Urology;  Laterality: N/A;  Perineal Wound Debridement;Placement Bilateral Testicular Thigh Pouches   Social History   Occupational History  . Not on file.   Social History Main Topics  . Smoking status: Current Every Day Smoker    Types: Cigarettes  . Smokeless tobacco: Never Used  . Alcohol use 0.0 oz/week     Comment: has not had alcohol in a month  . Drug use: No  . Sexual activity: Not on file

## 2016-09-18 ENCOUNTER — Ambulatory Visit (INDEPENDENT_AMBULATORY_CARE_PROVIDER_SITE_OTHER): Payer: BLUE CROSS/BLUE SHIELD | Admitting: Orthopaedic Surgery

## 2016-09-18 DIAGNOSIS — M1612 Unilateral primary osteoarthritis, left hip: Secondary | ICD-10-CM | POA: Diagnosis not present

## 2016-09-18 DIAGNOSIS — M25552 Pain in left hip: Secondary | ICD-10-CM

## 2016-09-18 NOTE — Progress Notes (Signed)
The patient is well-known to me. He is only 45 result but has severe end-stage arthritis versus end-stage avascular necrosis of his left hip. He is tried and failed all forms conservative treatment. His pain is daily, it is detrimentally affected his activity is daily living and his quality of life. It affects his mobility as well. He uses a cane for walking. We have discussed in detail before about proceeding with hip replaced surgery on his left hip. Regarding had a thorough discussion of risk and benefits of the surgery as well as showed him his x-rays and hip model to explain what the surgery involves. We have held off on surgery due to his hemoglobin A1c being very high. He comes today saying it's trending in the right direction and he would like to proceed with hip replaced surgery.  On examination he has significant pain with range of motion of his right hip and left hip. His left hip is much more symptomatically. His leg lengths are near equal. His hemoglobin A1c is now down to a point is deathly trending in the right direction. Overall he looks good. I do feel, proceeding with surgery.  We will work on getting his surgery scheduled. We had a given another discussion of risk and benefits of surgery. We would see him back in 2 weeks postoperative no x-rays of be needed.

## 2016-09-19 ENCOUNTER — Encounter (INDEPENDENT_AMBULATORY_CARE_PROVIDER_SITE_OTHER): Payer: Self-pay | Admitting: Orthopedic Surgery

## 2016-09-19 ENCOUNTER — Ambulatory Visit (INDEPENDENT_AMBULATORY_CARE_PROVIDER_SITE_OTHER): Payer: BLUE CROSS/BLUE SHIELD | Admitting: Orthopedic Surgery

## 2016-09-19 VITALS — Ht 73.0 in | Wt 229.0 lb

## 2016-09-19 DIAGNOSIS — Z89432 Acquired absence of left foot: Secondary | ICD-10-CM | POA: Diagnosis not present

## 2016-09-19 MED ORDER — GABAPENTIN 100 MG PO CAPS: 100.0000 mg | ORAL_CAPSULE | Freq: Three times a day (TID) | ORAL | 1 refills | Status: DC

## 2016-09-19 NOTE — Progress Notes (Signed)
Office Visit Note   Patient: Edward [name]           Date of Birth: 02-23-1971           MRN: [id number] Visit Date: 09/19/2016              Requested by: [name], MD [address], [place] [address] PCP: [name], MD  Chief Complaint  Patient presents with  . Left Foot - Follow-up    Left Transmetatarsal Amputation 05/10/16    HPI: Patient presents with follow up for left mid foot amputation. He is approximately 4 months post op. Incision is well healed. He does complain of phantom pain. He was unable to obtain prescription for gabapentin due to cost, and would like this renewed today. He is on the schedule for total hip replacement on 10/11/16 with Dr. Ninfa [place] and hopes this will help with his balance problems. [name], RT  The patient is a 46 year old gentleman seen today in follow up for left transmetatarsal amputation. This is well healed. He states he was unable to fill the Gabapentin. Would like me to resend this to his pharmacy. Overall is doing well does have some issues with balance today is wearing a slide on chronic on the left foot. States this falls off. Discussed that should be wearing lace up shoes however it is hard for him to bend over and lace his shoes.    Assessment & Plan: Visit Diagnoses:  1. S/P transmetatarsal amputation of foot, left ([place])     Plan: He will follow up with [place] in the office as needed.  Follow-Up Instructions: Return if symptoms worsen or fail to improve.   Ortho Exam Physical Exam  Constitutional: Appears well-developed.  Head: Normocephalic.  Eyes: EOM are normal.  Neck: Normal range of motion.  Cardiovascular: Normal rate.   Pulmonary/Chest: Effort normal.  Neurological: Is alert.  Skin: Skin is warm.  Psychiatric: Has a normal mood and affect. left foot: The transmetatarsal amputation is well-healed. There is no swelling no redness no open areas no sign of infection.  Imaging: No results  found.  Orders:  No orders of the defined types were placed in this encounter.  Meds ordered this encounter  Medications  . gabapentin (NEURONTIN) 100 MG capsule    Sig: Take 1 capsule (100 mg total) by mouth 3 (three) times daily. Take 1 tablet po hs for 1 week, then bid for 1 week, then tid    Dispense:  90 capsule    Refill:  1     Procedures: No procedures performed  Clinical Data: No additional findings.  Subjective: Review of Systems  Constitutional: Negative for chills and fever.  Skin: Negative for wound.    Objective: Vital Signs: Ht 6\' 1"  (1.854 m)   Wt 229 lb (103.9 kg)   BMI 30.21 kg/m   Specialty Comments:  No specialty comments available.  PMFS History: Patient Active Problem List   Diagnosis Date Noted  . Unilateral primary osteoarthritis, left hip 09/18/2016  . S/P transmetatarsal amputation of foot, left ([place]) 07/25/2016  . Balance problem 07/25/2016  . Sepsis ([place]) 05/07/2016  . ARF (acute renal failure) ([place]) 05/07/2016  . Fournier gangrene 08/16/2011  . Acute respiratory failure ([place]) 08/16/2011  . Septic shock(785.52) 08/16/2011  . Diabetic osteomyelitis ([place]) 08/16/2011   Past Medical History:  Diagnosis Date  . Diabetes mellitus   . Hypertension     Family History  Problem Relation Age of Onset  . Diabetes Other     Past Surgical History:  Procedure Laterality Date  . AMPUTATION Left 05/10/2016   Procedure: Left Transmetatarsal Amputation;  Surgeon: [name], MD;  Location: [facility];  Service: Orthopedics;  Laterality: Left;  . CIRCUMCISION  09/02/2011   Procedure: CIRCUMCISION ADULT;  Surgeon: Molli Hazard, MD;  Location: [facility];  Service: Urology;  Laterality: N/A;  . I&D EXTREMITY  09/02/2011   Procedure: IRRIGATION AND DEBRIDEMENT EXTREMITY;  Surgeon: Theodoro Kos, DO;  Location: [facility];  Service: Plastics;  Laterality: N/A;  . INCISION AND DRAINAGE OF WOUND  08/12/2011    Procedure: IRRIGATION AND DEBRIDEMENT WOUND;  Surgeon: Zenovia Jarred, MD;  Location: [facility];  Service: General;;  perineum  . IRRIGATION AND DEBRIDEMENT ABSCESS  08/12/2011   Procedure: IRRIGATION AND DEBRIDEMENT ABSCESS;  Surgeon: Molli Hazard, MD;  Location: [facility];  Service: Urology;  Laterality: N/A;  irrigation and debridment perineum    . IRRIGATION AND DEBRIDEMENT ABSCESS  08/14/2011   Procedure: MINOR INCISION AND DRAINAGE OF ABSCESS;  Surgeon: Molli Hazard, MD;  Location: [facility];  Service: Urology;  Laterality: N/A;  Perineal Wound Debridement;Placement Bilateral Testicular Thigh Pouches   Social History   Occupational History  . Not on file.   Social History Main Topics  . Smoking status: Current Every Day Smoker    Types: Cigarettes  . Smokeless tobacco: Never Used  . Alcohol use 0.0 oz/week     Comment: has not had alcohol in a month  . Drug use: No  . Sexual activity: Not on file

## 2016-10-01 ENCOUNTER — Other Ambulatory Visit (INDEPENDENT_AMBULATORY_CARE_PROVIDER_SITE_OTHER): Payer: Self-pay | Admitting: Physician Assistant

## 2016-10-02 ENCOUNTER — Other Ambulatory Visit (HOSPITAL_COMMUNITY): Payer: Self-pay | Admitting: Emergency Medicine

## 2016-10-02 NOTE — Progress Notes (Signed)
CXR 05-07-16 epic EKG 05-09-16 epic

## 2016-10-02 NOTE — Patient Instructions (Addendum)
Edward [name]  10/02/2016   Your procedure is scheduled on: 10-11-16  Report to [facility] Main  Entrance take [facility]  elevators to 3rd floor to  [place] at 12PM.  Call this number if you have problems the morning of surgery [phone]   Remember: ONLY 1 PERSON MAY GO WITH YOU TO SHORT STAY TO GET  READY MORNING OF [place].  Do not eat food After Midnight. YOU MAY HAVE CLEAR LIQUIDS FROM MIDNIGHT UNTIL 8AM ON THE DAY OF SURGERY. NOTHING BY MOUTH AFTER 8AM!     Take these medicines the morning of surgery with A SIP OF WATER: NONE                                You may not have any metal on your body including hair pins and              piercings  Do not wear jewelry, make-up, lotions, powders or perfumes, deodorant             Do not wear nail polish.  Do not shave  48 hours prior to surgery.              Men may shave face and neck.   Do not bring valuables to the hospital. [place].  Contacts, dentures or bridgework may not be worn into surgery.  Leave suitcase in the car. After surgery it may be brought to your room.              Please read over the following fact sheets you were given: _____________________________________________________________________    CLEAR LIQUID DIET   Foods Allowed                                                                     Foods Excluded  Coffee and tea, regular and decaf                             liquids that you cannot  Plain Jell-O in any flavor                                             see through such as: Fruit ices (not with fruit pulp)                                     milk, soups, orange juice  Iced Popsicles                                    All solid food Carbonated beverages, regular and diet  Cranberry, grape and apple juices Sports drinks like Gatorade Lightly seasoned clear broth or consume(fat  free) Sugar, honey syrup  Sample Menu Breakfast                                Lunch                                     Supper Cranberry juice                    Beef broth                            Chicken broth Jell-O                                     Grape juice                           Apple juice Coffee or tea                        Jell-O                                      Popsicle                                                Coffee or tea                        Coffee or tea  _____________________________________________________________________  How to Manage Your Diabetes Before and After Surgery  Why is it important to control my blood sugar before and after surgery? . Improving blood sugar levels before and after surgery helps healing and can limit problems. . A way of improving blood sugar control is eating a healthy diet by: o  Eating less sugar and carbohydrates o  Increasing activity/exercise o  Talking with your doctor about reaching your blood sugar goals . High blood sugars (greater than 180 mg/dL) can raise your risk of infections and slow your recovery, so you will need to focus on controlling your diabetes during the weeks before surgery. . Make sure that the doctor who takes care of your diabetes knows about your planned surgery including the date and location.  How do I manage my blood sugar before surgery? . Check your blood sugar at least 4 times a day, starting 2 days before surgery, to make sure that the level is not too high or low. o Check your blood sugar the morning of your surgery when you wake up and every 2 hours until you get to the Short Stay unit. . If your blood sugar is less than 70 mg/dL, you will need to treat for low blood sugar: o Do not take insulin. o Treat a low blood sugar (less than 70 mg/dL) with  cup of clear juice (cranberry or apple), 4 glucose tablets, OR glucose gel. o  Recheck blood sugar in 15 minutes after treatment (to  make sure it is greater than 70 mg/dL). If your blood sugar is not greater than 70 mg/dL on recheck, call [phone] for further instructions. . Report your blood sugar to the short stay nurse when you get to Short Stay.  . If you are admitted to the hospital after surgery: o Your blood sugar will be checked by the staff and you will probably be given insulin after surgery (instead of oral diabetes medicines) to make sure you have good blood sugar levels. o The goal for blood sugar control after surgery is 80-180 mg/dL.   WHAT DO I DO ABOUT MY DIABETES MEDICATION?  Marland Kitchen Do not take oral diabetes medicines (pills) the morning of surgery.  . THE NIGHT BEFORE SURGERY, take   22.5  units of  LANTUS      insulin.  .  . THE DAY/NIGHT BEFORE SURGERY take your Janumet as usual.  . THE MORNING OF SURGERY, DO NOT TAKE YOUR JANUMET!   Reviewed and Endorsed by [facility] Patient Education Committee, August 2015   [facility] - Preparing for Surgery Before surgery, you can play an important role.  Because skin is not sterile, your skin needs to be as free of germs as possible.  You can reduce the number of germs on your skin by washing with CHG (chlorahexidine gluconate) soap before surgery.  CHG is an antiseptic cleaner which kills germs and bonds with the skin to continue killing germs even after washing. Please DO NOT use if you have an allergy to CHG or antibacterial soaps.  If your skin becomes reddened/irritated stop using the CHG and inform your nurse when you arrive at Short Stay. Do not shave (including legs and underarms) for at least 48 hours prior to the first CHG shower.  You may shave your face/neck. Please follow these instructions carefully:  1.  Shower with CHG Soap the night before surgery and the  morning of Surgery.  2.  If you choose to wash your hair, wash your hair first as usual with your  normal  shampoo.  3.  After you shampoo, rinse your hair and body thoroughly to remove  the  shampoo.                           4.  Use CHG as you would any other liquid soap.  You can apply chg directly  to the skin and wash                       Gently with a scrungie or clean washcloth.  5.  Apply the CHG Soap to your body ONLY FROM THE NECK DOWN.   Do not use on face/ open                           Wound or open sores. Avoid contact with eyes, ears mouth and genitals (private parts).                       Wash face,  Genitals (private parts) with your normal soap.             6.  Wash thoroughly, paying special attention to the area where your surgery  will be performed.  7.  Thoroughly rinse your body with warm water from the neck down.  8.  DO NOT shower/wash with your normal soap after using and rinsing off  the CHG Soap.                9.  Pat yourself dry with a clean towel.            10.  Wear clean pajamas.            11.  Place clean sheets on your bed the night of your first shower and do not  sleep with pets. Day of Surgery : Do not apply any lotions/deodorants the morning of surgery.  Please wear clean clothes to the hospital/surgery center.  FAILURE TO FOLLOW THESE INSTRUCTIONS MAY RESULT IN THE CANCELLATION OF YOUR SURGERY PATIENT SIGNATURE_________________________________  NURSE SIGNATURE__________________________________  ________________________________________________________________________   Adam Phenix  An incentive spirometer is a tool that can help keep your lungs clear and active. This tool measures how well you are filling your lungs with each breath. Taking long deep breaths may help reverse or decrease the chance of developing breathing (pulmonary) problems (especially infection) following:  A long period of time when you are unable to move or be active. BEFORE THE PROCEDURE   If the spirometer includes an indicator to show your best effort, your nurse or respiratory therapist will set it to a desired goal.  If possible, sit up  straight or lean slightly forward. Try not to slouch.  Hold the incentive spirometer in an upright position. INSTRUCTIONS FOR USE  1. Sit on the edge of your bed if possible, or sit up as far as you can in bed or on a chair. 2. Hold the incentive spirometer in an upright position. 3. Breathe out normally. 4. Place the mouthpiece in your mouth and seal your lips tightly around it. 5. Breathe in slowly and as deeply as possible, raising the piston or the ball toward the top of the column. 6. Hold your breath for 3-5 seconds or for as long as possible. Allow the piston or ball to fall to the bottom of the column. 7. Remove the mouthpiece from your mouth and breathe out normally. 8. Rest for a few seconds and repeat Steps 1 through 7 at least 10 times every 1-2 hours when you are awake. Take your time and take a few normal breaths between deep breaths. 9. The spirometer may include an indicator to show your best effort. Use the indicator as a goal to work toward during each repetition. 10. After each set of 10 deep breaths, practice coughing to be sure your lungs are clear. If you have an incision (the cut made at the time of surgery), support your incision when coughing by placing a pillow or rolled up towels firmly against it. Once you are able to get out of bed, walk around indoors and cough well. You may stop using the incentive spirometer when instructed by your caregiver.  RISKS AND COMPLICATIONS  Take your time so you do not get dizzy or light-headed.  If you are in pain, you may need to take or ask for pain medication before doing incentive spirometry. It is harder to take a deep breath if you are having pain. AFTER USE  Rest and breathe slowly and easily.  It can be helpful to keep track of a log of your progress. Your caregiver can provide you with a simple table to help with this. If you are using the spirometer at home, follow these instructions: [place] IF:   You are  having difficultly using the spirometer.  You have trouble using the spirometer as often as instructed.  Your pain medication is not giving enough relief while using the spirometer.  You develop fever of 100.5 F (38.1 C) or higher. SEEK IMMEDIATE MEDICAL CARE IF:   You cough up bloody sputum that had not been present before.  You develop fever of 102 F (38.9 C) or greater.  You develop worsening pain at or near the incision site. MAKE SURE YOU:   Understand these instructions.  Will watch your condition.  Will get help right away if you are not doing well or get worse. Document Released: 12/16/2006 Document Revised: 10/28/2011 Document Reviewed: 02/16/2007 [facility] Patient Information 2014 [place], [place].   ________________________________________________________________________

## 2016-10-04 ENCOUNTER — Encounter (HOSPITAL_COMMUNITY): Payer: Self-pay

## 2016-10-04 ENCOUNTER — Encounter (HOSPITAL_COMMUNITY)
Admission: RE | Admit: 2016-10-04 | Discharge: 2016-10-04 | Disposition: A | Discharge status: Home / Self Care | Payer: BLUE CROSS/BLUE SHIELD | Source: Ambulatory Visit | Attending: Orthopaedic Surgery | Admitting: Orthopaedic Surgery

## 2016-10-04 DIAGNOSIS — E119 Type 2 diabetes mellitus without complications: Secondary | ICD-10-CM | POA: Diagnosis not present

## 2016-10-04 DIAGNOSIS — Z01818 Encounter for other preprocedural examination: Secondary | ICD-10-CM | POA: Insufficient documentation

## 2016-10-04 HISTORY — DX: Unspecified osteoarthritis, unspecified site: M19.90

## 2016-10-04 LAB — BASIC METABOLIC PANEL
Anion gap: 6 (ref 5–15)
BUN: 32 mg/dL — ABNORMAL HIGH (ref 6–20)
CALCIUM: 9 mg/dL (ref 8.9–10.3)
CO2: 22 mmol/L (ref 22–32)
CREATININE: 1.56 mg/dL — AB (ref 0.61–1.24)
Chloride: 110 mmol/L (ref 101–111)
GFR calc non Af Amer: 52 mL/min — ABNORMAL LOW (ref >60)
GFR, EST AFRICAN AMERICAN: 60 mL/min — AB (ref >60)
Glucose, Bld: 145 mg/dL — ABNORMAL HIGH (ref 65–99)
Potassium: 4.3 mmol/L (ref 3.5–5.1)
Sodium: 138 mmol/L (ref 135–145)

## 2016-10-04 LAB — GLUCOSE, CAPILLARY: Glucose-Capillary: 145 mg/dL — ABNORMAL HIGH (ref 65–99)

## 2016-10-04 LAB — CBC
HCT: 43.3 % (ref 39.0–52.0)
Hemoglobin: 15 g/dL (ref 13.0–17.0)
MCH: 28.8 pg (ref 26.0–34.0)
MCHC: 34.6 g/dL (ref 30.0–36.0)
MCV: 83.1 fL (ref 78.0–100.0)
Platelets: 227 10*3/uL (ref 150–400)
RBC: 5.21 MIL/uL (ref 4.22–5.81)
RDW: 12.8 % (ref 11.5–15.5)
WBC: 6 10*3/uL (ref 4.0–10.5)

## 2016-10-04 LAB — ABO/RH: ABO/RH(D): B POS

## 2016-10-04 LAB — SURGICAL PCR SCREEN
MRSA, PCR: NEGATIVE
Staphylococcus aureus: NEGATIVE

## 2016-10-04 NOTE — Progress Notes (Signed)
BMP results routed via epic to dr Ninfa [place]

## 2016-10-05 LAB — HEMOGLOBIN A1C
Hgb A1c MFr Bld: 10.7 % — ABNORMAL HIGH (ref 4.8–5.6)
MEAN PLASMA GLUCOSE: 260 mg/dL

## 2016-10-07 NOTE — Progress Notes (Signed)
hga1c results routed via epic to Dr Jean Rosenthal

## 2016-10-11 ENCOUNTER — Inpatient Hospital Stay (HOSPITAL_COMMUNITY)
Admission: RE | Admit: 2016-10-11 | Discharge: 2016-10-14 | DRG: 470 | Disposition: A | Discharge status: Home Health | Payer: BLUE CROSS/BLUE SHIELD | Source: Ambulatory Visit | Attending: Orthopaedic Surgery | Admitting: Orthopaedic Surgery

## 2016-10-11 ENCOUNTER — Inpatient Hospital Stay (HOSPITAL_COMMUNITY): Payer: BLUE CROSS/BLUE SHIELD

## 2016-10-11 ENCOUNTER — Encounter (HOSPITAL_COMMUNITY)
Admission: RE | Disposition: A | Discharge status: Home Health | Payer: Self-pay | Source: Ambulatory Visit | Attending: Orthopaedic Surgery

## 2016-10-11 ENCOUNTER — Encounter (HOSPITAL_COMMUNITY): Payer: Self-pay

## 2016-10-11 ENCOUNTER — Inpatient Hospital Stay (HOSPITAL_COMMUNITY): Payer: BLUE CROSS/BLUE SHIELD | Admitting: Anesthesiology

## 2016-10-11 DIAGNOSIS — D62 Acute posthemorrhagic anemia: Secondary | ICD-10-CM | POA: Diagnosis not present

## 2016-10-11 DIAGNOSIS — Z794 Long term (current) use of insulin: Secondary | ICD-10-CM

## 2016-10-11 DIAGNOSIS — Z79899 Other long term (current) drug therapy: Secondary | ICD-10-CM

## 2016-10-11 DIAGNOSIS — E1165 Type 2 diabetes mellitus with hyperglycemia: Secondary | ICD-10-CM | POA: Diagnosis present

## 2016-10-11 DIAGNOSIS — F17210 Nicotine dependence, cigarettes, uncomplicated: Secondary | ICD-10-CM | POA: Diagnosis present

## 2016-10-11 DIAGNOSIS — Z833 Family history of diabetes mellitus: Secondary | ICD-10-CM | POA: Diagnosis not present

## 2016-10-11 DIAGNOSIS — M879 Osteonecrosis, unspecified: Secondary | ICD-10-CM | POA: Diagnosis present

## 2016-10-11 DIAGNOSIS — M87052 Idiopathic aseptic necrosis of left femur: Secondary | ICD-10-CM | POA: Diagnosis not present

## 2016-10-11 DIAGNOSIS — Z89432 Acquired absence of left foot: Secondary | ICD-10-CM

## 2016-10-11 DIAGNOSIS — M25552 Pain in left hip: Secondary | ICD-10-CM | POA: Diagnosis present

## 2016-10-11 DIAGNOSIS — I10 Essential (primary) hypertension: Secondary | ICD-10-CM | POA: Diagnosis present

## 2016-10-11 DIAGNOSIS — Z96642 Presence of left artificial hip joint: Secondary | ICD-10-CM

## 2016-10-11 DIAGNOSIS — M1612 Unilateral primary osteoarthritis, left hip: Secondary | ICD-10-CM | POA: Diagnosis present

## 2016-10-11 DIAGNOSIS — Z419 Encounter for procedure for purposes other than remedying health state, unspecified: Secondary | ICD-10-CM

## 2016-10-11 HISTORY — PX: TOTAL HIP ARTHROPLASTY: SHX124

## 2016-10-11 LAB — TYPE AND SCREEN
ABO/RH(D): B POS
Antibody Screen: NEGATIVE

## 2016-10-11 LAB — GLUCOSE, CAPILLARY
GLUCOSE-CAPILLARY: 137 mg/dL — AB (ref 65–99)
Glucose-Capillary: 239 mg/dL — ABNORMAL HIGH (ref 65–99)
Glucose-Capillary: 159 mg/dL — ABNORMAL HIGH (ref 65–99)
Glucose-Capillary: 161 mg/dL — ABNORMAL HIGH (ref 65–99)

## 2016-10-11 SURGERY — ARTHROPLASTY, HIP, TOTAL, ANTERIOR APPROACH
Anesthesia: Spinal | Site: Hip | Laterality: Left

## 2016-10-11 MED ORDER — ACETAMINOPHEN 650 MG RE SUPP: 650.0000 mg | Freq: Four times a day (QID) | RECTAL | Status: DC | PRN

## 2016-10-11 MED ORDER — DOCUSATE SODIUM 100 MG PO CAPS
100.0000 mg | ORAL_CAPSULE | Freq: Two times a day (BID) | ORAL | Status: DC
  Administered 2016-10-11 – 2016-10-13 (×5): 100 mg via ORAL
  Filled 2016-10-11 (×6): qty 1

## 2016-10-11 MED ORDER — CLINDAMYCIN PHOSPHATE 600 MG/50ML IV SOLN
600.0000 mg | Freq: Four times a day (QID) | INTRAVENOUS | Status: AC
  Administered 2016-10-11 – 2016-10-12 (×2): 600 mg via INTRAVENOUS
  Filled 2016-10-11 (×2): qty 50

## 2016-10-11 MED ORDER — ONDANSETRON HCL 4 MG/2ML IJ SOLN
INTRAMUSCULAR | Status: AC
  Filled 2016-10-11: qty 2

## 2016-10-11 MED ORDER — CLINDAMYCIN PHOSPHATE 900 MG/50ML IV SOLN
900.0000 mg | Freq: Once | INTRAVENOUS | Status: AC
  Administered 2016-10-11: 900 mg via INTRAVENOUS

## 2016-10-11 MED ORDER — CEFAZOLIN SODIUM-DEXTROSE 2-4 GM/100ML-% IV SOLN
2.0000 g | INTRAVENOUS | Status: AC
  Administered 2016-10-11: 2 g via INTRAVENOUS
  Filled 2016-10-11: qty 100

## 2016-10-11 MED ORDER — AMOXICILLIN-POT CLAVULANATE 500-125 MG PO TABS
2.0000 | ORAL_TABLET | Freq: Two times a day (BID) | ORAL | Status: DC
  Administered 2016-10-12 – 2016-10-13 (×4): 1000 mg via ORAL
  Filled 2016-10-11 (×6): qty 2

## 2016-10-11 MED ORDER — GABAPENTIN 300 MG PO CAPS
600.0000 mg | ORAL_CAPSULE | Freq: Once | ORAL | Status: AC
  Administered 2016-10-11: 600 mg via ORAL
  Filled 2016-10-11 (×2): qty 2

## 2016-10-11 MED ORDER — ONDANSETRON HCL 4 MG PO TABS
4.0000 mg | ORAL_TABLET | Freq: Four times a day (QID) | ORAL | Status: DC | PRN
  Filled 2016-10-11: qty 1

## 2016-10-11 MED ORDER — METOCLOPRAMIDE HCL 5 MG/ML IJ SOLN: 5.0000 mg | Freq: Three times a day (TID) | INTRAMUSCULAR | Status: DC | PRN

## 2016-10-11 MED ORDER — CELECOXIB 200 MG PO CAPS
ORAL_CAPSULE | ORAL | Status: AC
  Filled 2016-10-11: qty 1

## 2016-10-11 MED ORDER — ALUM & MAG HYDROXIDE-SIMETH 200-200-20 MG/5ML PO SUSP: 30.0000 mL | ORAL | Status: DC | PRN

## 2016-10-11 MED ORDER — PROPOFOL 10 MG/ML IV BOLUS
INTRAVENOUS | Status: AC
  Filled 2016-10-11: qty 60

## 2016-10-11 MED ORDER — ACETAMINOPHEN 325 MG PO TABS
650.0000 mg | ORAL_TABLET | Freq: Four times a day (QID) | ORAL | Status: DC | PRN
Start: 2016-10-11 — End: 2016-10-14
  Administered 2016-10-11: 23:00:00 650 mg via ORAL
  Filled 2016-10-11: qty 2

## 2016-10-11 MED ORDER — MENTHOL 3 MG MT LOZG: 1.0000 | LOZENGE | OROMUCOSAL | Status: DC | PRN

## 2016-10-11 MED ORDER — KETOROLAC TROMETHAMINE 15 MG/ML IJ SOLN
7.5000 mg | Freq: Four times a day (QID) | INTRAMUSCULAR | Status: AC
  Administered 2016-10-11 – 2016-10-12 (×4): 7.5 mg via INTRAVENOUS
  Filled 2016-10-11 (×4): qty 1

## 2016-10-11 MED ORDER — PHENYLEPHRINE HCL 10 MG/ML IJ SOLN
INTRAMUSCULAR | Status: AC
  Filled 2016-10-11: qty 1

## 2016-10-11 MED ORDER — METOCLOPRAMIDE HCL 5 MG PO TABS: 5.0000 mg | ORAL_TABLET | Freq: Three times a day (TID) | ORAL | Status: DC | PRN

## 2016-10-11 MED ORDER — ACETAMINOPHEN 500 MG PO TABS
1000.0000 mg | ORAL_TABLET | Freq: Once | ORAL | Status: AC
  Administered 2016-10-11: 1000 mg via ORAL
  Filled 2016-10-11 (×2): qty 2

## 2016-10-11 MED ORDER — DEXAMETHASONE SODIUM PHOSPHATE 10 MG/ML IJ SOLN
INTRAMUSCULAR | Status: AC
  Filled 2016-10-11: qty 1

## 2016-10-11 MED ORDER — MIDAZOLAM HCL 2 MG/2ML IJ SOLN
INTRAMUSCULAR | Status: AC
  Filled 2016-10-11: qty 2

## 2016-10-11 MED ORDER — PROPOFOL 500 MG/50ML IV EMUL
INTRAVENOUS | Status: DC | PRN
  Administered 2016-10-11: 75 ug/kg/min via INTRAVENOUS

## 2016-10-11 MED ORDER — PHENYLEPHRINE HCL 10 MG/ML IJ SOLN
INTRAVENOUS | Status: DC | PRN
  Administered 2016-10-11: 35 ug/min via INTRAVENOUS

## 2016-10-11 MED ORDER — FENTANYL CITRATE (PF) 100 MCG/2ML IJ SOLN
INTRAMUSCULAR | Status: DC | PRN
  Administered 2016-10-11: 100 ug via INTRAVENOUS

## 2016-10-11 MED ORDER — SODIUM CHLORIDE 0.9 % IR SOLN
Status: DC | PRN
  Administered 2016-10-11: 1000 mL

## 2016-10-11 MED ORDER — INSULIN ASPART 100 UNIT/ML ~~LOC~~ SOLN
0.0000 [IU] | Freq: Three times a day (TID) | SUBCUTANEOUS | Status: DC
Start: 2016-10-12 — End: 2016-10-14
  Administered 2016-10-12: 18:00:00 3 [IU] via SUBCUTANEOUS
  Administered 2016-10-12: 4 [IU] via SUBCUTANEOUS
  Administered 2016-10-13 (×2): 3 [IU] via SUBCUTANEOUS

## 2016-10-11 MED ORDER — METFORMIN HCL 500 MG PO TABS
1000.0000 mg | ORAL_TABLET | Freq: Two times a day (BID) | ORAL | Status: DC
  Administered 2016-10-12 – 2016-10-13 (×4): 1000 mg via ORAL
  Filled 2016-10-11 (×4): qty 2

## 2016-10-11 MED ORDER — BUPIVACAINE IN DEXTROSE 0.75-8.25 % IT SOLN
INTRATHECAL | Status: DC | PRN
  Administered 2016-10-11: 15 mg via INTRATHECAL

## 2016-10-11 MED ORDER — ASPIRIN 81 MG PO CHEW
81.0000 mg | CHEWABLE_TABLET | Freq: Two times a day (BID) | ORAL | Status: DC
  Administered 2016-10-11 – 2016-10-13 (×5): 81 mg via ORAL
  Filled 2016-10-11 (×6): qty 1

## 2016-10-11 MED ORDER — INSULIN ASPART 100 UNIT/ML ~~LOC~~ SOLN: 0.0000 [IU] | Freq: Every day | SUBCUTANEOUS | Status: DC

## 2016-10-11 MED ORDER — CLINDAMYCIN PHOSPHATE 900 MG/50ML IV SOLN
INTRAVENOUS | Status: AC
  Filled 2016-10-11: qty 50

## 2016-10-11 MED ORDER — METHOCARBAMOL 1000 MG/10ML IJ SOLN
500.0000 mg | Freq: Four times a day (QID) | INTRAVENOUS | Status: DC | PRN
  Administered 2016-10-11: 500 mg via INTRAVENOUS
  Filled 2016-10-11 (×2): qty 5

## 2016-10-11 MED ORDER — FENTANYL CITRATE (PF) 100 MCG/2ML IJ SOLN
INTRAMUSCULAR | Status: AC
  Filled 2016-10-11: qty 2

## 2016-10-11 MED ORDER — TRANEXAMIC ACID 1000 MG/10ML IV SOLN
1000.0000 mg | INTRAVENOUS | Status: AC
  Administered 2016-10-11: 1000 mg via INTRAVENOUS
  Filled 2016-10-11: qty 1100

## 2016-10-11 MED ORDER — ONDANSETRON HCL 4 MG/2ML IJ SOLN: 4.0000 mg | Freq: Four times a day (QID) | INTRAMUSCULAR | Status: DC | PRN

## 2016-10-11 MED ORDER — LINAGLIPTIN 5 MG PO TABS
5.0000 mg | ORAL_TABLET | Freq: Every day | ORAL | Status: DC
  Administered 2016-10-12 – 2016-10-13 (×2): 5 mg via ORAL
  Filled 2016-10-11 (×2): qty 1

## 2016-10-11 MED ORDER — LACTATED RINGERS IV SOLN
INTRAVENOUS | Status: DC
  Administered 2016-10-11 (×3): via INTRAVENOUS

## 2016-10-11 MED ORDER — CELECOXIB 200 MG PO CAPS
200.0000 mg | ORAL_CAPSULE | Freq: Once | ORAL | Status: AC
  Administered 2016-10-11: 200 mg via ORAL

## 2016-10-11 MED ORDER — PHENOL 1.4 % MT LIQD: 1.0000 | OROMUCOSAL | Status: DC | PRN

## 2016-10-11 MED ORDER — LIDOCAINE 2% (20 MG/ML) 5 ML SYRINGE
INTRAMUSCULAR | Status: AC
  Filled 2016-10-11: qty 5

## 2016-10-11 MED ORDER — CHLORHEXIDINE GLUCONATE 4 % EX LIQD: 60.0000 mL | Freq: Once | CUTANEOUS | Status: DC

## 2016-10-11 MED ORDER — DIPHENHYDRAMINE HCL 12.5 MG/5ML PO ELIX: 12.5000 mg | ORAL_SOLUTION | ORAL | Status: DC | PRN

## 2016-10-11 MED ORDER — HYDROMORPHONE HCL 1 MG/ML IJ SOLN
1.0000 mg | INTRAMUSCULAR | Status: DC | PRN
  Administered 2016-10-11 – 2016-10-13 (×5): 1 mg via INTRAVENOUS
  Filled 2016-10-11 (×5): qty 1

## 2016-10-11 MED ORDER — INSULIN GLARGINE 100 UNIT/ML ~~LOC~~ SOLN
45.0000 [IU] | Freq: Every day | SUBCUTANEOUS | Status: DC
  Administered 2016-10-11 – 2016-10-13 (×3): 45 [IU] via SUBCUTANEOUS
  Filled 2016-10-11 (×3): qty 0.45

## 2016-10-11 MED ORDER — MIDAZOLAM HCL 5 MG/5ML IJ SOLN
INTRAMUSCULAR | Status: DC | PRN
  Administered 2016-10-11: 2 mg via INTRAVENOUS

## 2016-10-11 MED ORDER — SITAGLIPTIN PHOS-METFORMIN HCL 50-1000 MG PO TABS: 1.0000 | ORAL_TABLET | Freq: Two times a day (BID) | ORAL | Status: DC

## 2016-10-11 MED ORDER — GLYCOPYRROLATE 0.2 MG/ML IV SOSY
PREFILLED_SYRINGE | INTRAVENOUS | Status: AC
  Filled 2016-10-11: qty 3

## 2016-10-11 MED ORDER — HYDROMORPHONE HCL 1 MG/ML IJ SOLN: 0.2500 mg | INTRAMUSCULAR | Status: DC | PRN

## 2016-10-11 MED ORDER — OXYCODONE HCL 5 MG PO TABS
5.0000 mg | ORAL_TABLET | ORAL | Status: DC | PRN
  Administered 2016-10-11 – 2016-10-13 (×8): 10 mg via ORAL
  Filled 2016-10-11 (×10): qty 2

## 2016-10-11 MED ORDER — PHENYLEPHRINE 40 MCG/ML (10ML) SYRINGE FOR IV PUSH (FOR BLOOD PRESSURE SUPPORT)
PREFILLED_SYRINGE | INTRAVENOUS | Status: AC
  Filled 2016-10-11: qty 10

## 2016-10-11 MED ORDER — POLYETHYLENE GLYCOL 3350 17 G PO PACK: 17.0000 g | PACK | Freq: Every day | ORAL | Status: DC | PRN

## 2016-10-11 MED ORDER — PROPOFOL 10 MG/ML IV BOLUS
INTRAVENOUS | Status: AC
  Filled 2016-10-11: qty 20

## 2016-10-11 MED ORDER — METHOCARBAMOL 500 MG PO TABS
500.0000 mg | ORAL_TABLET | Freq: Four times a day (QID) | ORAL | Status: DC | PRN
  Administered 2016-10-12 – 2016-10-13 (×5): 500 mg via ORAL
  Filled 2016-10-11 (×5): qty 1

## 2016-10-11 MED ORDER — GABAPENTIN 300 MG PO CAPS
ORAL_CAPSULE | ORAL | Status: AC
  Filled 2016-10-11: qty 1

## 2016-10-11 MED ORDER — SODIUM CHLORIDE 0.9 % IV SOLN
INTRAVENOUS | Status: DC
  Administered 2016-10-11: 20:00:00 via INTRAVENOUS

## 2016-10-11 SURGICAL SUPPLY — 41 items
APL SKNCLS STERI-STRIP NONHPOA (GAUZE/BANDAGES/DRESSINGS)
BAG SPEC THK2 15X12 ZIP CLS (MISCELLANEOUS)
BAG ZIPLOCK 12X15 (MISCELLANEOUS) IMPLANT
BENZOIN TINCTURE PRP APPL 2/3 (GAUZE/BANDAGES/DRESSINGS) IMPLANT
BLADE SAW SGTL 18X1.27X75 (BLADE) ×2 IMPLANT
BLADE SAW SGTL 18X1.27X75MM (BLADE) ×1
CAPT HIP TOTAL 2 ×2 IMPLANT
CELLS DAT CNTRL 66122 CELL SVR (MISCELLANEOUS) ×1 IMPLANT
CLOSURE WOUND 1/2 X4 (GAUZE/BANDAGES/DRESSINGS)
CLOTH BEACON ORANGE TIMEOUT ST (SAFETY) ×3 IMPLANT
COVER PERINEAL POST (MISCELLANEOUS) ×3 IMPLANT
DRAPE STERI IOBAN 125X83 (DRAPES) ×3 IMPLANT
DRAPE U-SHAPE 47X51 STRL (DRAPES) ×6 IMPLANT
DRSG AQUACEL AG ADV 3.5X10 (GAUZE/BANDAGES/DRESSINGS) ×3 IMPLANT
DURAPREP 26ML APPLICATOR (WOUND CARE) ×3 IMPLANT
ELECT REM PT RETURN 9FT ADLT (ELECTROSURGICAL) ×3
ELECTRODE REM PT RTRN 9FT ADLT (ELECTROSURGICAL) ×1 IMPLANT
GAUZE XEROFORM 1X8 LF (GAUZE/BANDAGES/DRESSINGS) ×2 IMPLANT
GLOVE BIO SURGEON STRL SZ7.5 (GLOVE) ×3 IMPLANT
GLOVE BIOGEL PI IND STRL 8 (GLOVE) ×2 IMPLANT
GLOVE BIOGEL PI INDICATOR 8 (GLOVE) ×4
GLOVE ECLIPSE 8.0 STRL XLNG CF (GLOVE) ×3 IMPLANT
GOWN STRL REUS W/TWL XL LVL3 (GOWN DISPOSABLE) ×6 IMPLANT
HANDPIECE INTERPULSE COAX TIP (DISPOSABLE) ×3
HOLDER FOLEY CATH W/STRAP (MISCELLANEOUS) ×3 IMPLANT
NS IRRIG 1000ML POUR BTL (IV SOLUTION) ×2 IMPLANT
PACK ANTERIOR HIP CUSTOM (KITS) ×3 IMPLANT
RETRACTOR WND ALEXIS 18 MED (MISCELLANEOUS) ×1 IMPLANT
RTRCTR WOUND ALEXIS 18CM MED (MISCELLANEOUS) ×3
SET HNDPC FAN SPRY TIP SCT (DISPOSABLE) ×1 IMPLANT
STAPLER VISISTAT 35W (STAPLE) IMPLANT
STRIP CLOSURE SKIN 1/2X4 (GAUZE/BANDAGES/DRESSINGS) IMPLANT
SUT ETHIBOND NAB CT1 #1 30IN (SUTURE) ×3 IMPLANT
SUT MNCRL AB 4-0 PS2 18 (SUTURE) IMPLANT
SUT VIC AB 0 CT1 36 (SUTURE) ×3 IMPLANT
SUT VIC AB 1 CT1 36 (SUTURE) ×3 IMPLANT
SUT VIC AB 2-0 CT1 27 (SUTURE) ×6
SUT VIC AB 2-0 CT1 TAPERPNT 27 (SUTURE) ×2 IMPLANT
TRAY FOLEY W/METER SILVER 16FR (SET/KITS/TRAYS/PACK) ×3 IMPLANT
WATER STERILE IRR 1000ML POUR (IV SOLUTION) ×4 IMPLANT
YANKAUER SUCT BULB TIP 10FT TU (MISCELLANEOUS) ×3 IMPLANT

## 2016-10-11 NOTE — Anesthesia Preprocedure Evaluation (Addendum)
Anesthesia Evaluation  Patient identified by MRN, date of birth, ID band Patient awake    Reviewed: Allergy & Precautions, H&P , NPO status , Patient's Chart, lab work & pertinent test results  Airway Mallampati: III  TM Distance: >3 FB Neck ROM: Full    Dental no notable dental hx. (+) Teeth Intact, Dental Advisory Given   Pulmonary Current Smoker,    Pulmonary exam normal breath sounds clear to auscultation       Cardiovascular hypertension,  Rhythm:Regular Rate:Normal     Neuro/Psych negative neurological ROS  negative psych ROS   GI/Hepatic negative GI ROS, Neg liver ROS,   Endo/Other  diabetes, Insulin Dependent, Oral Hypoglycemic Agents  Renal/GU negative Renal ROS  negative genitourinary   Musculoskeletal  (+) Arthritis , Osteoarthritis,    Abdominal   Peds  Hematology negative hematology ROS (+)   Anesthesia Other Findings   Reproductive/Obstetrics negative OB ROS                            Anesthesia Physical Anesthesia Plan  ASA: III  Anesthesia Plan: Spinal   Post-op Pain Management:    Induction: Intravenous  Airway Management Planned: Simple Face Mask  Additional Equipment:   Intra-op Plan:   Post-operative Plan:   Informed Consent: I have reviewed the patients History and Physical, chart, labs and discussed the procedure including the risks, benefits and alternatives for the proposed anesthesia with the patient or authorized representative who has indicated his/her understanding and acceptance.   Dental advisory given  Plan Discussed with: CRNA  Anesthesia Plan Comments:         Anesthesia Quick Evaluation

## 2016-10-11 NOTE — H&P (Signed)
TOTAL HIP ADMISSION H&P  Patient is admitted for left total hip arthroplasty.  Subjective:  Chief Complaint: left hip pain  HPI: Edward [name], 46 y.o. male, has a history of pain and functional disability in the left hip(s) due to avascular necrosis and patient has failed non-surgical conservative treatments for greater than 12 weeks to include NSAID's and/or analgesics, flexibility and strengthening excercises, use of assistive devices, weight reduction as appropriate and activity modification.  Onset of symptoms was abrupt starting 2 years ago with rapidlly worsening course since that time.The patient noted no past surgery on the left hip(s).  Patient currently rates pain in the left hip at 10 out of 10 with activity. Patient has night pain, worsening of pain with activity and weight bearing, trendelenberg gait, pain that interfers with activities of daily living, pain with passive range of motion, crepitus and joint swelling. Patient has evidence of subchondral cysts, joint space narrowing and femoral head collapse by imaging studies. This condition presents safety issues increasing the risk of falls.  There is no current active infection.  Patient Active Problem List   Diagnosis Date Noted  . Avascular necrosis of hip, left ([place]) 10/11/2016  . Unilateral primary osteoarthritis, left hip 09/18/2016  . S/P transmetatarsal amputation of foot, left ([place]) 07/25/2016  . Balance problem 07/25/2016  . Sepsis ([place]) 05/07/2016  . ARF (acute renal failure) ([place]) 05/07/2016  . Fournier gangrene 08/16/2011  . Acute respiratory failure ([place]) 08/16/2011  . Septic shock(785.52) 08/16/2011  . Diabetic osteomyelitis ([place]) 08/16/2011   Past Medical History:  Diagnosis Date  . Arthritis   . Diabetes mellitus   . Hypertension     Past Surgical History:  Procedure Laterality Date  . AMPUTATION Left 05/10/2016   Procedure: Left Transmetatarsal Amputation;  Surgeon: [name], MD;  Location: [facility];   Service: Orthopedics;  Laterality: Left;  . CIRCUMCISION  09/02/2011   Procedure: CIRCUMCISION ADULT;  Surgeon: Molli Hazard, MD;  Location: [facility];  Service: Urology;  Laterality: N/A;  . I&D EXTREMITY  09/02/2011   Procedure: IRRIGATION AND DEBRIDEMENT EXTREMITY;  Surgeon: Theodoro Kos, DO;  Location: [facility];  Service: Plastics;  Laterality: N/A;  . INCISION AND DRAINAGE OF WOUND  08/12/2011   Procedure: IRRIGATION AND DEBRIDEMENT WOUND;  Surgeon: Zenovia Jarred, MD;  Location: [facility];  Service: General;;  perineum  . IRRIGATION AND DEBRIDEMENT ABSCESS  08/12/2011   Procedure: IRRIGATION AND DEBRIDEMENT ABSCESS;  Surgeon: Molli Hazard, MD;  Location: [facility];  Service: Urology;  Laterality: N/A;  irrigation and debridment perineum    . IRRIGATION AND DEBRIDEMENT ABSCESS  08/14/2011   Procedure: MINOR INCISION AND DRAINAGE OF ABSCESS;  Surgeon: Molli Hazard, MD;  Location: [facility];  Service: Urology;  Laterality: N/A;  Perineal Wound Debridement;Placement Bilateral Testicular Thigh Pouches    No prescriptions prior to admission.   No Known Allergies  Social History  Substance Use Topics  . Smoking status: Current Every Day Smoker    Types: Cigarettes  . Smokeless tobacco: Never Used  . Alcohol use 0.0 oz/week     Comment: has not had alcohol in a month    Family History  Problem Relation Age of Onset  . Diabetes Other      Review of Systems  Musculoskeletal: Positive for joint pain.  All other systems reviewed and are negative.   Objective:  Physical Exam  Constitutional: He is oriented to person, place, and time. He  appears well-developed and well-nourished.  HENT:  Head: Normocephalic and atraumatic.  Eyes: EOM are normal. Pupils are equal, round, and reactive to light.  Neck: Normal range of motion. Neck supple.  Cardiovascular: Normal rate and regular rhythm.   Respiratory: Effort normal and breath sounds  normal.  GI: Soft. Bowel sounds are normal.  Musculoskeletal:       Left hip: He exhibits decreased range of motion, decreased strength, tenderness, bony tenderness and deformity.  Neurological: He is alert and oriented to person, place, and time.  Skin: Skin is warm and dry.  Psychiatric: He has a normal mood and affect.    Vital signs in last 24 hours:    Labs:   Estimated body mass index is 30.83 kg/m as calculated from the following:   Height as of 10/04/16: 6\' 1"  (1.854 m).   Weight as of 10/04/16: 233 lb 11.2 oz (106 kg).   Imaging Review Plain radiographs demonstrate severe end-stage avascular necrosis of the left hip(s). The bone quality appears to be good for age and reported activity level.  Assessment/Plan:  AVN, left hip(s)  The patient history, physical examination, clinical judgement of the provider and imaging studies are consistent with end stage avascular necrosis left hip(s) and total hip arthroplasty is deemed medically necessary. The treatment options including medical management, injection therapy, arthroscopy and arthroplasty were discussed at length. The risks and benefits of total hip arthroplasty were presented and reviewed. The risks due to aseptic loosening, infection, stiffness, dislocation/subluxation,  thromboembolic complications and other imponderables were discussed.  The patient acknowledged the explanation, agreed to proceed with the plan and consent was signed. Patient is being admitted for inpatient treatment for surgery, pain control, PT, OT, prophylactic antibiotics, VTE prophylaxis, progressive ambulation and ADL's and discharge planning.The patient is planning to be discharged home with home health services

## 2016-10-11 NOTE — Anesthesia Procedure Notes (Signed)
Spinal  Patient location during procedure: OR Start time: 10/11/2016 2:18 PM End time: 10/11/2016 2:20 PM Staffing Anesthesiologist: Roderic Palau Performed: anesthesiologist  Preanesthetic Checklist Completed: patient identified, surgical consent, pre-op evaluation, timeout performed, IV checked, risks and benefits discussed and monitors and equipment checked Spinal Block Patient position: sitting Prep: DuraPrep Patient monitoring: cardiac monitor, continuous pulse ox and blood pressure Approach: midline Location: L3-4 Injection technique: single-shot Needle Needle type: Pencan  Needle gauge: 24 G Needle length: 9 cm Assessment Sensory level: T8 Additional Notes Functioning IV was confirmed and monitors were applied. Sterile prep and drape, including hand hygiene and sterile gloves were used. The patient was positioned and the spine was prepped. The skin was anesthetized with lidocaine.  Free flow of clear CSF was obtained prior to injecting local anesthetic into the CSF.  The spinal needle aspirated freely following injection.  The needle was carefully withdrawn.  The patient tolerated the procedure well. CRNA attempted at L2-3 prior to my placement at L3-4

## 2016-10-11 NOTE — Brief Op Note (Signed)
10/11/2016  3:35 PM  PATIENT:  Edward [name]  46 y.o. male  PRE-OPERATIVE DIAGNOSIS:  severe osteoarthritis vs. avascular necrosis left hip  POST-OPERATIVE DIAGNOSIS:  severe osteoarthritis  PROCEDURE:  Procedure(s): LEFT TOTAL HIP ARTHROPLASTY ANTERIOR APPROACH (Left)  SURGEON:  Surgeon(s) and Role:    * Mcarthur Rossetti, MD - Primary  PHYSICIAN ASSISTANT: Benita Stabile, PA-C  ANESTHESIA:   spinal  EBL:  Total I/O In: 1000 [I.V.:1000] Out: 100 [Blood:100]  COUNTS:  YES  DICTATION: .Other Dictation: Dictation Number [id number]  PLAN OF CARE: Admit to inpatient   PATIENT DISPOSITION:  PACU - hemodynamically stable.   Delay start of Pharmacological VTE agent (>24hrs) due to surgical blood loss or risk of bleeding: no

## 2016-10-11 NOTE — Transfer of Care (Signed)
Immediate Anesthesia Transfer of Care Note  Patient: Edward [name]  Procedure(s) Performed: Procedure(s): LEFT TOTAL HIP ARTHROPLASTY ANTERIOR APPROACH (Left)  Patient Location: PACU  Anesthesia Type:Spinal  Level of Consciousness: awake, alert , oriented and patient cooperative  Airway & Oxygen Therapy: Patient Spontanous Breathing and Patient connected to face mask oxygen  Post-op Assessment: Report given to RN and Post -op Vital signs reviewed and stable  Post vital signs: stable  Last Vitals:  Vitals:   10/11/16 1157  BP: 120/88  Pulse: (!) 101  Resp: 18  Temp: 36.9 C    Last Pain:  Vitals:   10/11/16 1157  TempSrc: Oral         Complications: No apparent anesthesia complications

## 2016-10-12 LAB — BASIC METABOLIC PANEL
ANION GAP: 7 (ref 5–15)
BUN: 29 mg/dL — ABNORMAL HIGH (ref 6–20)
CHLORIDE: 108 mmol/L (ref 101–111)
CO2: 23 mmol/L (ref 22–32)
Calcium: 8.3 mg/dL — ABNORMAL LOW (ref 8.9–10.3)
Creatinine, Ser: 1.57 mg/dL — ABNORMAL HIGH (ref 0.61–1.24)
GFR calc non Af Amer: 51 mL/min — ABNORMAL LOW (ref >60)
GFR, EST AFRICAN AMERICAN: 59 mL/min — AB (ref >60)
Glucose, Bld: 271 mg/dL — ABNORMAL HIGH (ref 65–99)
Potassium: 4.7 mmol/L (ref 3.5–5.1)
Sodium: 138 mmol/L (ref 135–145)

## 2016-10-12 LAB — CBC
HEMATOCRIT: 36.6 % — AB (ref 39.0–52.0)
Hemoglobin: 12.2 g/dL — ABNORMAL LOW (ref 13.0–17.0)
MCH: 28.3 pg (ref 26.0–34.0)
MCHC: 33.3 g/dL (ref 30.0–36.0)
MCV: 84.9 fL (ref 78.0–100.0)
Platelets: 231 10*3/uL (ref 150–400)
RBC: 4.31 MIL/uL (ref 4.22–5.81)
RDW: 13 % (ref 11.5–15.5)
WBC: 8.7 10*3/uL (ref 4.0–10.5)

## 2016-10-12 LAB — GLUCOSE, CAPILLARY
Glucose-Capillary: 153 mg/dL — ABNORMAL HIGH (ref 65–99)
Glucose-Capillary: 109 mg/dL — ABNORMAL HIGH (ref 65–99)
Glucose-Capillary: 134 mg/dL — ABNORMAL HIGH (ref 65–99)
Glucose-Capillary: 119 mg/dL — ABNORMAL HIGH (ref 65–99)

## 2016-10-12 MED ORDER — INSULIN ASPART 100 UNIT/ML ~~LOC~~ SOLN: 0.0000 [IU] | Freq: Three times a day (TID) | SUBCUTANEOUS | Status: DC

## 2016-10-12 MED ORDER — AMOXICILLIN-POT CLAVULANATE 500-125 MG PO TABS: 2.0000 | ORAL_TABLET | Freq: Two times a day (BID) | ORAL | 0 refills | Status: DC

## 2016-10-12 MED ORDER — METHOCARBAMOL 500 MG PO TABS: 500.0000 mg | ORAL_TABLET | Freq: Four times a day (QID) | ORAL | 0 refills | Status: DC | PRN

## 2016-10-12 MED ORDER — ASPIRIN 81 MG PO CHEW: 81.0000 mg | CHEWABLE_TABLET | Freq: Two times a day (BID) | ORAL | 0 refills | Status: DC

## 2016-10-12 MED ORDER — INSULIN ASPART 100 UNIT/ML ~~LOC~~ SOLN: 0.0000 [IU] | Freq: Every day | SUBCUTANEOUS | Status: DC

## 2016-10-12 MED ORDER — OXYCODONE-ACETAMINOPHEN 5-325 MG PO TABS: 1.0000 | ORAL_TABLET | ORAL | 0 refills | Status: DC | PRN

## 2016-10-12 NOTE — Evaluation (Signed)
Occupational Therapy Evaluation Patient Details Name: Edward [name] MRN: [id number] DOB: Apr 22, 1971 Today's Date: 10/12/2016    History of Present Illness Pt is a 46 year old male s/p L direct anterior THA with hx of L transmetatarsal amputation 04/2016   Clinical Impression   Pt admitted with the above diagnoses and presents with below problem list. Pt will benefit from continued acute OT to address the below listed deficits and maximize independence with basic ADLs prior to d/c home. PTA pt was independent with ADLs. Pt is currently min A with LB ADLs and functional mobility/transfers. Limited by 10/10 L hip pain, premedicated. Session details below.       Follow Up Recommendations  No OT follow up    Equipment Recommendations  3 in 1 bedside commode    Recommendations for Other Services       Precautions / Restrictions Precautions Precautions: Fall Restrictions Other Position/Activity Restrictions: WBAT      Mobility Bed Mobility Overal bed mobility: Needs Assistance Bed Mobility: Supine to Sit;Sit to Supine     Supine to sit: Min assist;HOB elevated Sit to supine: Min assist;HOB elevated   General bed mobility comments: assist for L LE, pt utilized rails and HOB elevated to self assist  Transfers Overall transfer level: Needs assistance Equipment used: Rolling walker (2 wheeled) Transfers: Sit to/from Stand Sit to Stand: Min assist         General transfer comment: verbal cues for UE and LE positioning, assist to rise, steady, and control descent    Balance                                            ADL Overall ADL's : Needs assistance/impaired Eating/Feeding: Set up;Sitting   Grooming: Set up   Upper Body Bathing: Set up;Sitting   Lower Body Bathing: Minimal assistance;Sit to/from stand   Upper Body Dressing : Set up;Sitting   Lower Body Dressing: Minimal assistance;Sit to/from stand   Toilet Transfer: Minimal  assistance;Ambulation;RW   Toileting- Clothing Manipulation and Hygiene: Minimal assistance;Sit to/from stand   Tub/ Shower Transfer: Walk-in shower;Minimal assistance;Rolling walker;3 in 1   Functional mobility during ADLs: Min guard;Minimal assistance;Rolling walker General ADL Comments: Pt completed bed mobility and in-room functinoal mobility. ADL and home setup education reviewed.      Vision         Perception     Praxis      Pertinent Vitals/Pain Pain Assessment: 0-10 Pain Score: 10-Worst pain ever Faces Pain Scale: Hurts even more Pain Location: L hip Pain Descriptors / Indicators: Aching;Sore Pain Intervention(s): Limited activity within patient's tolerance;Monitored during session;Premedicated before session;Repositioned     Hand Dominance     Extremity/Trunk Assessment Upper Extremity Assessment Upper Extremity Assessment: Overall WFL for tasks assessed   Lower Extremity Assessment Lower Extremity Assessment: Defer to PT evaluation LLE Deficits / Details: anticipated L hip post op weakness       Communication Communication Communication: No difficulties   Cognition Arousal/Alertness: Awake/alert Behavior During Therapy: WFL for tasks assessed/performed Overall Cognitive Status: Within Functional Limits for tasks assessed                     General Comments       Exercises       Shoulder Instructions      Home Living Family/patient expects to be discharged to:: Private  residence Living Arrangements: Children Available Help at Discharge: Family;Available PRN/intermittently Type of Home: House Home Access: Stairs to enter [employer] of Steps: 1 and 1 Entrance Stairs-Rails: None Home Layout: One level     Bathroom Shower/Tub: Occupational psychologist: Standard Bathroom Accessibility: No   Home Equipment: Environmental consultant - 2 wheels   Additional Comments: pt states 58 year old son at home can assist upon d/c       Prior Functioning/Environment Level of Independence: Independent                 OT Problem List: Impaired balance (sitting and/or standing);Decreased knowledge of use of DME or AE;Decreased knowledge of precautions;Pain      OT Treatment/Interventions: Self-care/ADL training;DME and/or AE instruction;Therapeutic activities;Patient/family education;Balance training    OT Goals(Current goals can be found in the care plan section) Acute Rehab OT Goals Patient Stated Goal: not stated OT Goal Formulation: With patient Time For Goal Achievement: 10/19/16 Potential to Achieve Goals: Good ADL Goals Pt Will Perform Grooming: with supervision;standing Pt Will Perform Lower Body Bathing: with supervision;with adaptive equipment;sit to/from stand Pt Will Perform Lower Body Dressing: with supervision;with adaptive equipment;sit to/from stand Pt Will Transfer to Toilet: with supervision;ambulating Pt Will Perform Toileting - Clothing Manipulation and hygiene: with supervision;sit to/from stand Pt Will Perform Tub/Shower Transfer: with min guard assist;ambulating;rolling walker;shower seat;3 in 1  OT Frequency: Min 2X/week   Barriers to D/C:            Co-evaluation              End of Session Equipment Utilized During Treatment: Gait belt;Rolling walker  Activity Tolerance: Patient limited by pain;Patient tolerated treatment well Patient left: in bed;with call bell/phone within reach  OT Visit Diagnosis: Other abnormalities of gait and mobility (R26.89);Pain Pain - Right/Left: Left Pain - part of body: Hip                ADL either performed or assessed with clinical judgement  Time: 2130-8657 OT Time Calculation (min): 16 min Charges:  OT General Charges $OT Visit: 1 Procedure OT Evaluation $OT Eval Low Complexity: 1 Procedure G-Codes:       Hortencia Pilar 10/12/2016, 3:12 PM

## 2016-10-12 NOTE — Op Note (Signed)
NAME:  NEIKO, TRIVEDI NO.:  [id number]  MEDICAL RECORD NO.:  [id number]  LOCATION:  PERIO                        FACILITY:  [facility]  PHYSICIAN:  Lind Guest. Ninfa Linden, M.D.DATE OF BIRTH:  [birth year]  DATE OF PROCEDURE:  10/11/2016 DATE OF DISCHARGE:                              OPERATIVE REPORT   PREOPERATIVE DIAGNOSIS:  Primary osteoarthritis and degenerative joint disease, left hip.  POSTOPERATIVE DIAGNOSIS:  Primary osteoarthritis and degenerative joint disease, left hip.  PROCEDURE:  Left total hip arthroplasty through direct anterior approach.  IMPLANTS:  DePuy Sector Gription acetabular component size 58, size 36+ 4 polyethylene liner, size 14 Corail femoral component with varus offset (KLA), size 36+ 8.5 ceramic hip ball.  SURGEON:  Lind Guest. Ninfa Linden, M.D.  ASSISTANT:  Erskine Emery, PA-C.  ANESTHESIA:  Spinal.  ANTIBIOTICS:  2 g of IV Ancef and 900 mg of IV clindamycin.  BLOOD LOSS:  Less than 200 mL.  COMPLICATIONS:  None.  INDICATIONS:  Mr. Dengel is a 46 year old poorly-controlled diabetic, who is really worked hard to get his hemoglobin A1c down and it is trending in the right direction.  We have seen him for considerable amount of time and he has debilitating arthritis involving his left hip. This is also component of avascular necrosis and I think this may be more of AVN than it is osteoarthritis based on his x-rays.  He ambulates with a crutch and his left leg is considerably shorter than his right due to the collapse in his femoral head.  His pain is daily.  It is 10/10.  It is detrimentally affected his activities of daily living, his quality of life and his mobility.  He does wish to proceed with surgery at this point and I agree with go ahead and proceeding because he has worked on lifestyle modifications and working on his health significantly in general.  He understands the risks of acute blood loss anemia, nerve and  vessel injury, fracture, infection, DVT with the risk of infection being incredibly high given his diabetes.  He understands our goals are decreased pain, improved mobility and overall improved quality of life.  PROCEDURE DESCRIPTION:  After informed consent was obtained, appropriate left hip was marked.  He was brought to the operating room.  Spinal anesthesia was obtained.  He was then laid in a supine position on the stretcher.  Foley catheter was placed and both feet had traction boots applied to them.  Next, he was placed supine on the Hana fracture table with the perineal post in place and both legs in inline skeletal traction devices, but no traction applied.  His left operative hip was prepped and draped with DuraPrep and sterile drapes.  A time-out was called and he was identified as correct patient and correct left hip.  I then made an incision just inferior and posterior to the anterior superior iliac spine and carried this obliquely down the leg.  We dissected down the tensor fascia lata muscle and tensor fascia was then divided longitudinally to proceed with a direct anterior approach to the hip.  We identified and cauterized the circumflex vessels and then identified the hip capsule.  I opened up the  hip capsule in L-type format finding significant arthritis in his left hip.  We placed Cobra retractor around the medial and lateral femoral neck and then made our femoral neck cut with an oscillating saw and completed this with an osteotome.  We placed a corkscrew guide in the femoral head and removed the femoral head in its entirety and found it to be completely devoid of cartilage.  We placed a bent Hohmann over the medial acetabular rim and released the transverse acetabular ligament.  I removed remnants of the acetabular labrum.  We then began reaming under direct visualization from a size 42 reamer and jumped up the increments all the way up to a size 58 with all reamers  under direct visualization and the last reamer under direct fluoroscopy, so we could obtain our depth of reaming, our inclination and anteversion.  Once we were pleased with this, we placed the real DePuy Sector Gription acetabular component size 58 and a 36+ 4 polyethylene liner for that size acetabular component.  Attention was then turned to the femur.  With the leg externally rotated to 120 degrees, extended and adducted, we were able to release the lateral joint capsule.  We placed a Mueller retractor medially and a Hohmann retractor behind the greater trochanter.  We then used a box cutting osteotome to enter the femoral canal and a rongeur to lateralize.  We then began broaching using the Corail femoral broaching system from DePuy going from a size 8 up to a size 14.  With the size 14 in place, we trialed a varus offset femoral neck and a 36+ 5 hip ball given that he was living short for some time.  We brought the leg back over and up with traction and internal rotation reducing the pelvis.  We were pleased with stability and offset, but we felt like he needed a little bit more leg length.  He had good rotation as well.  We dislocated the hip and removed the trial components.  We were then able to place the real Corail femoral component size 14 with varus offset and real 36+ 8.5 ceramic hip ball.  We reduced this in the acetabulum.  We were pleased with stability, leg length and offset.  We then irrigated the hip with normal saline solution using pulsatile lavage.  We were able to get a good joint capsule, closed with interrupted #1 Ethibond suture followed by running #1 Vicryl in the tensor fascia, 0 Vicryl in the deep tissue, 2-0 Vicryl in the subcutaneous tissue and interrupted staples on the skin.  Xeroform and an Aquacel dressing were applied.  He was taken off the fracture table and taken to the recovery room in stable condition. All final counts were correct.  There were no  complications noted.  Of note, Erskine Emery, PA-C assisted in the entire case.  His assistance was crucial for facilitating all aspects of this case.     Lind Guest. Ninfa Linden, M.D.     CYB/MEDQ  D:  10/11/2016  T:  10/12/2016  Job:  [id number]

## 2016-10-12 NOTE — Anesthesia Postprocedure Evaluation (Signed)
Anesthesia Post Note  Patient: Edward [name]  Procedure(s) Performed: Procedure(s) (LRB): LEFT TOTAL HIP ARTHROPLASTY ANTERIOR APPROACH (Left)  Patient location during evaluation: PACU Anesthesia Type: Spinal Level of consciousness: awake and alert Pain management: pain level controlled Vital Signs Assessment: post-procedure vital signs reviewed and stable Respiratory status: spontaneous breathing and respiratory function stable Cardiovascular status: blood pressure returned to baseline and stable Postop Assessment: spinal receding Anesthetic complications: no       Last Vitals:  Vitals:   10/12/16 0610 10/12/16 1041  BP: (!) 147/86 130/90  Pulse: 94 83  Resp: 16 16  Temp: 37.1 C 36.7 C    Last Pain:  Vitals:   10/12/16 1041  TempSrc: Oral  PainSc:                  Kaladin Noseworthy,W. EDMOND

## 2016-10-12 NOTE — Progress Notes (Signed)
   Subjective:  Patient reports pain as mild.  No events.  Objective:   VITALS:   Vitals:   10/11/16 2120 10/11/16 2235 10/12/16 0241 10/12/16 0610  BP: 135/89 (!) 147/75 (!) 150/84 (!) 147/86  Pulse: 100 99 98 94  Resp: 16 16 16 16   Temp: 98.7 F (37.1 C) 98.8 F (37.1 C) 98.8 F (37.1 C) 98.8 F (37.1 C)  TempSrc: Oral Oral Oral Oral  SpO2: 98% 99% 98% 97%  Weight:      Height:        Neurologically intact Neurovascular intact Sensation intact distally Intact pulses distally Dorsiflexion/Plantar flexion intact Incision: dressing C/D/I and no drainage No cellulitis present Compartment soft   Lab Results  Component Value Date   WBC 8.7 10/12/2016   HGB 12.2 (L) 10/12/2016   HCT 36.6 (L) 10/12/2016   MCV 84.9 10/12/2016   PLT 231 10/12/2016     Assessment/Plan:  1 Day Post-Op   - Expected postop acute blood loss anemia - will monitor for symptoms - Up with PT/OT - DVT ppx - SCDs, ambulation, aspirin - WBAT operative extremity - Pain control - Discharge planning  Eduard Roux 10/12/2016, 8:08 AM [phone]

## 2016-10-12 NOTE — Discharge Instructions (Signed)
Please get a dentist appointment as soon as possible to take care of your tooth abscess. You can get your actual dressing wet in the shower daily. You can remove that dressing in one week and get your actual incision wet; then place a new similar dressing that can stay on until your outpatient follow-up.

## 2016-10-12 NOTE — Progress Notes (Signed)
   10/12/16 1544  PT Visit Information  Last PT Received On 10/12/16  Assistance Needed +1  History of Present Illness Pt is a 46 year old male s/p L direct anterior THA with hx of L transmetatarsal amputation 04/2016  Subjective Data  Subjective Pt performed LE exercises in bed and then ambulated in hallway.  Precautions  Precautions Fall  Restrictions  Other Position/Activity Restrictions WBAT  Pain Assessment  Pain Assessment 0-10  Pain Score 4  Pain Location L hip  Pain Descriptors / Indicators Aching;Sore  Pain Intervention(s) Limited activity within patient's tolerance;Monitored during session;Repositioned;Ice applied  Cognition  Arousal/Alertness Awake/alert  Behavior During Therapy WFL for tasks assessed/performed  Overall Cognitive Status Within Functional Limits for tasks assessed  Bed Mobility  Overal bed mobility Needs Assistance  Bed Mobility Supine to Sit;Sit to Supine  Supine to sit Min assist;HOB elevated  Sit to supine Min assist;HOB elevated  General bed mobility comments assist for L LE, pt utilized rails and HOB elevated to self assist  Transfers  Overall transfer level Needs assistance  Equipment used Rolling walker (2 wheeled)  Transfers Sit to/from Stand  Sit to Stand Min guard  General transfer comment verbal cues for UE and LE positioning, assist to rise, steady, and control descent  Ambulation/Gait  Ambulation/Gait assistance Min guard  Ambulation Distance (Feet) 60 Feet  Assistive device Rolling walker (2 wheeled)  Gait Pattern/deviations Step-to pattern;Decreased stance time - left;Antalgic  General Gait Details verbal cues for sequence, RW positioning, step length, posture  Exercises  Exercises Total Joint  Total Joint Exercises  Ankle Circles/Pumps AROM;10 reps;Both  Quad Sets AROM;Both;10 reps  Heel Slides AAROM;Left;10 reps  Hip ABduction/ADduction AROM;Left;10 reps  Long Arc Quad AROM;Left;10 reps  PT - End of Session  Activity Tolerance  Patient limited by pain  Patient left in bed;with call bell/phone within reach;with nursing/sitter in room (standing at EOB trying to use urinal with NT)  PT - Assessment/Plan  PT Plan Current plan remains appropriate  PT Visit Diagnosis Other abnormalities of gait and mobility (R26.89)  PT Frequency (ACUTE ONLY) 7X/week  Follow Up Recommendations Home health PT;Supervision/Assistance - 24 hour  PT equipment None recommended by PT  AM-PAC PT "6 Clicks" Daily Activity Outcome Measure  Difficulty turning over in bed (including adjusting bedclothes, sheets and blankets)? 3  Difficulty moving from lying on back to sitting on the side of the bed?  3  Difficulty sitting down on and standing up from a chair with arms (e.g., wheelchair, bedside commode, etc,.)? 3  Help needed moving to and from a bed to chair (including a wheelchair)? 3  Help needed walking in hospital room? 3  Help needed climbing 3-5 steps with a railing?  3  6 Click Score 18  Mobility G Code  CK  PT Goal Progression  Progress towards PT goals Progressing toward goals  PT Time Calculation  PT Start Time (ACUTE ONLY) 1401  PT Stop Time (ACUTE ONLY) 1415  PT Time Calculation (min) (ACUTE ONLY) 14 min  PT General Charges  $$ ACUTE PT VISIT 1 Procedure  PT Treatments  $Therapeutic Exercise 8-22 mins   [name], PT, DPT 10/12/2016 Pager: [phone]

## 2016-10-12 NOTE — Evaluation (Signed)
Physical Therapy Evaluation Patient Details Name: Edward [name] MRN: [id number] DOB: 1971-07-31 Today's Date: 10/12/2016   History of Present Illness  Pt is a 46 year old male s/p L direct anterior THA with hx of L transmetatarsal amputation 04/2016  Clinical Impression  Pt is s/p THA resulting in the deficits listed below (see PT Problem List).  Pt will benefit from skilled PT to increase their independence and safety with mobility to allow discharge to the venue listed below.  Pt assisted OOB and able to tolerate short distance ambulation POD #1.  Pt states his 45 year old son can assist him upon d/c home.  Pt has hx of L transmetatarsal amputation and recommended he bring in his L shoe if this would make mobility easier for him.      Follow Up Recommendations Home health PT;Supervision/Assistance - 24 hour    Equipment Recommendations  None recommended by PT    Recommendations for Other Services       Precautions / Restrictions Precautions Precautions: Fall Restrictions Other Position/Activity Restrictions: WBAT      Mobility  Bed Mobility Overal bed mobility: Needs Assistance Bed Mobility: Supine to Sit     Supine to sit: Min assist;HOB elevated     General bed mobility comments: assist for L LE, pt utilized rails and HOB elevated to self assist  Transfers Overall transfer level: Needs assistance Equipment used: Rolling walker (2 wheeled) Transfers: Sit to/from Stand Sit to Stand: Min assist         General transfer comment: verbal cues for UE and LE positioning, assist to rise, steady, and control descent  Ambulation/Gait Ambulation/Gait assistance: Min assist Ambulation Distance (Feet): 40 Feet Assistive device: Rolling walker (2 wheeled) Gait Pattern/deviations: Step-to pattern;Decreased stance time - left;Antalgic     General Gait Details: verbal cues for sequence, RW positioning, step length, posture  Stairs            Wheelchair Mobility     Modified Rankin (Stroke Patients Only)       Balance                                             Pertinent Vitals/Pain Pain Assessment: Faces Faces Pain Scale: Hurts even more Pain Location: L hip Pain Descriptors / Indicators: Aching;Sore Pain Intervention(s): Monitored during session;Limited activity within patient's tolerance;Repositioned    Home Living Family/patient expects to be discharged to:: Private residence Living Arrangements: Children Available Help at Discharge: Family;Available PRN/intermittently Type of Home: House Home Access: Stairs to enter Entrance Stairs-Rails: None Entrance Stairs-Number of Steps: 1 and 1 Home Layout: One level Home Equipment: Environmental consultant - 2 wheels Additional Comments: pt states 80 year old son at home can assist upon d/c    Prior Function Level of Independence: Independent               Hand Dominance        Extremity/Trunk Assessment        Lower Extremity Assessment Lower Extremity Assessment: LLE deficits/detail LLE Deficits / Details: anticipated L hip post op weakness       Communication   Communication: No difficulties  Cognition Arousal/Alertness: Awake/alert Behavior During Therapy: WFL for tasks assessed/performed Overall Cognitive Status: Within Functional Limits for tasks assessed  General Comments      Exercises     Assessment/Plan    PT Assessment Patient needs continued PT services  PT Problem List Decreased strength;Decreased mobility;Pain;Decreased knowledge of use of DME;Decreased activity tolerance       PT Treatment Interventions Functional mobility training;Stair training;Gait training;Therapeutic activities;Therapeutic exercise;DME instruction;Patient/family education    PT Goals (Current goals can be found in the Care Plan section)  Acute Rehab PT Goals PT Goal Formulation: With patient Time For Goal Achievement: 10/16/16 Potential  to Achieve Goals: Good    Frequency 7X/week   Barriers to discharge        Co-evaluation               End of Session Equipment Utilized During Treatment: Gait belt Activity Tolerance: Patient limited by pain Patient left: in chair;with call bell/phone within reach;with family/visitor present   PT Visit Diagnosis: Other abnormalities of gait and mobility (R26.89)         Time: 5945-8592 PT Time Calculation (min) (ACUTE ONLY): 15 min   Charges:   PT Evaluation $PT Eval Low Complexity: 1 Procedure     PT G Codes:         [name],KATHrine E 10/12/2016, 12:44 PM [name], PT, DPT 10/12/2016 Pager: [phone]

## 2016-10-12 NOTE — Progress Notes (Signed)
OT Cancellation Note  Patient Details Name: Edward [name] MRN: [id number] DOB: [birth year]   Cancelled Treatment:    Reason Eval/Treat Not Completed: Pain limiting ability to participate. Pt currently 10/10 pain level. Nursing notified. Will reattempt and try to time with pain med.   Tyrone Schimke OTR/L Pager: [phone]  10/12/2016, 12:08 PM

## 2016-10-13 LAB — GLUCOSE, CAPILLARY
GLUCOSE-CAPILLARY: 142 mg/dL — AB (ref 65–99)
GLUCOSE-CAPILLARY: 111 mg/dL — AB (ref 65–99)
Glucose-Capillary: 131 mg/dL — ABNORMAL HIGH (ref 65–99)
Glucose-Capillary: 101 mg/dL — ABNORMAL HIGH (ref 65–99)

## 2016-10-13 NOTE — Progress Notes (Signed)
   Subjective:  Patient reports pain as mild.  No events.  Objective:   VITALS:   Vitals:   10/12/16 1423 10/12/16 1752 10/12/16 2130 10/13/16 0500  BP: 133/76 (!) 164/93 (!) 192/95 (!) 172/89  Pulse: 96 88 89 93  Resp: 16 16 20 18   Temp: 97.8 F (36.6 C) 98.5 F (36.9 C) 99.7 F (37.6 C) 98.9 F (37.2 C)  TempSrc: Oral Oral Oral Oral  SpO2: 97% 98% 97% 99%  Weight:      Height:        Neurologically intact Neurovascular intact Sensation intact distally Intact pulses distally Dorsiflexion/Plantar flexion intact Incision: dressing C/D/I and no drainage No cellulitis present Compartment soft   Lab Results  Component Value Date   WBC 8.7 10/12/2016   HGB 12.2 (L) 10/12/2016   HCT 36.6 (L) 10/12/2016   MCV 84.9 10/12/2016   PLT 231 10/12/2016     Assessment/Plan:  2 Days Post-Op   - Expected postop acute blood loss anemia - will monitor for symptoms - up with PT - home tomorrow  Edward [name] 10/13/2016, 12:02 PM [phone]

## 2016-10-13 NOTE — Progress Notes (Signed)
   10/13/16 1659  PT Visit Information  Last PT Received On 10/13/16  History of Present Illness Pt is a 46 year old male s/p L direct anterior THA with hx of L transmetatarsal amputation 04/2016  Subjective Data  Subjective pt says he is  sleepy  Patient Stated Goal not stated  Precautions  Precautions Fall  Restrictions  Other Position/Activity Restrictions WBAT  Pain Assessment  Pain Assessment 0-10  Pain Score 5  Pain Location L hip  Pain Descriptors / Indicators Aching;Sore  Pain Intervention(s) Limited activity within patient's tolerance;Monitored during session;Premedicated before session  Cognition  Arousal/Alertness Awake/alert  Behavior During Therapy WFL for tasks assessed/performed  Overall Cognitive Status Within Functional Limits for tasks assessed  Total Joint Exercises  Ankle Circles/Pumps AROM;10 reps;Both  Quad Sets AROM;Both;10 reps  [place] [employer];Left;10 reps  Heel Slides AAROM;Left;10 reps  Hip ABduction/ADduction AROM;Left;10 reps  PT - End of Session  Activity Tolerance Patient tolerated treatment well  Patient left in bed;with call bell/phone within reach;with family/visitor present  PT - Assessment/Plan  PT Plan Current plan remains appropriate  PT Visit Diagnosis Other abnormalities of gait and mobility (R26.89)  PT Frequency (ACUTE ONLY) 7X/week  Follow Up Recommendations Home health PT;Supervision/Assistance - 24 hour  PT equipment None recommended by PT  AM-PAC PT "6 Clicks" Daily Activity Outcome Measure  Difficulty turning over in bed (including adjusting bedclothes, sheets and blankets)? 3  Difficulty moving from lying on back to sitting on the side of the bed?  3  Difficulty sitting down on and standing up from a chair with arms (e.g., wheelchair, bedside commode, etc,.)? 3  Help needed moving to and from a bed to chair (including a wheelchair)? 3  Help needed walking in hospital room? 3  Help needed climbing 3-5 steps with a  railing?  3  6 Click Score 18  Mobility G Code  CK  PT Goal Progression  Progress towards PT goals Progressing toward goals  Acute Rehab PT Goals  PT Goal Formulation With patient  Time For Goal Achievement 10/16/16  Potential to Achieve Goals Good  PT Time Calculation  PT Start Time (ACUTE ONLY) 1615  PT Stop Time (ACUTE ONLY) 1625  PT Time Calculation (min) (ACUTE ONLY) 10 min  PT General Charges  $$ ACUTE PT VISIT 1 Procedure  PT Treatments  $Therapeutic Exercise 8-22 mins

## 2016-10-13 NOTE — Progress Notes (Signed)
  Physical Therapy Treatment Patient Details Name: Edward [name] MRN: [id number] DOB: 1971-06-28 Today's Date: 10-29-16    History of Present Illness Pt is a 46 year old male s/p L direct anterior THA with hx of L transmetatarsal amputation 04/2016    PT Comments     Pt doing well, feels slighty sleepy from having had IV pain meds--encouraged pt to try po meds;   Follow Up Recommendations  Home health PT;Supervision/Assistance - 24 hour     Equipment Recommendations  None recommended by PT    Recommendations for Other Services       Precautions / Restrictions Precautions Precautions: Fall Restrictions Weight Bearing Restrictions: No Other Position/Activity Restrictions: WBAT    Mobility  Bed Mobility Overal bed mobility: Needs Assistance Bed Mobility: Supine to Sit;Sit to Supine     Supine to sit: Supervision Sit to supine: Min assist   General bed mobility comments: assist for L LE on to bed  Transfers Overall transfer level: Needs assistance Equipment used: Rolling walker (2 wheeled) Transfers: Sit to/from Stand Sit to Stand: Min guard         General transfer comment: cues for hand placement and LE management.  Ambulation/Gait Ambulation/Gait assistance: Supervision;Min guard Ambulation Distance (Feet): 110 Feet Assistive device: Rolling walker (2 wheeled) Gait Pattern/deviations: Step-to pattern;Decreased stance time - left;Antalgic     General Gait Details: verbal cues for sequence, RW positioning, step length, posture   Stairs            Wheelchair Mobility    Modified Rankin (Stroke Patients Only)       Balance                                    Cognition Arousal/Alertness: Awake/alert Behavior During Therapy: WFL for tasks assessed/performed Overall Cognitive Status: Within Functional Limits for tasks assessed                      Exercises      General Comments        Pertinent Vitals/Pain  Pain Assessment: 0-10 Pain Score: 5  Pain Location: L hip Pain Descriptors / Indicators: Aching Pain Intervention(s): Limited activity within patient's tolerance;Monitored during session;Premedicated before session;Ice applied    Home Living                      Prior Function            PT Goals (current goals can now be found in the care plan section) Acute Rehab PT Goals PT Goal Formulation: With patient Time For Goal Achievement: 10/16/16 Potential to Achieve Goals: Good Progress towards PT goals: Progressing toward goals    Frequency    7X/week      PT Plan Current plan remains appropriate    Co-evaluation             End of Session Equipment Utilized During Treatment: Gait belt Activity Tolerance: Patient tolerated treatment well Patient left: in bed;with call bell/phone within reach   PT Visit Diagnosis: Other abnormalities of gait and mobility (R26.89)     Time: 0630-1601 PT Time Calculation (min) (ACUTE ONLY): 11 min  Charges:  $Gait Training: 8-22 mins                    G Codes:       Jessic Standifer 10-29-16, 12:28 PM

## 2016-10-13 NOTE — Progress Notes (Signed)
Occupational Therapy Treatment Patient Details Name: Edward [name] MRN: [id number] DOB: 06-17-1971 Today's Date: 10/13/2016    History of present illness Pt is a 46 year old male s/p L direct anterior THA with hx of L transmetatarsal amputation 04/2016   OT comments  Pt continues to be limited by pain 6-7/10 during session and reports feeling alittle groggy. Did tolerate up to 3in1 with walker and practicing with AE at [facility]. Will continue to follow on acute.   Follow Up Recommendations  No OT follow up    Equipment Recommendations  None recommended by OT (pt states he owns 3in1)    Recommendations for Other Services      Precautions / Restrictions Precautions Precautions: Fall Restrictions Weight Bearing Restrictions: No Other Position/Activity Restrictions: WBAT       Mobility Bed Mobility Overal bed mobility: Needs Assistance Bed Mobility: Supine to Sit     Supine to sit: HOB elevated;Min assist     General bed mobility comments: assist for L LE over to EOB  Transfers Overall transfer level: Needs assistance Equipment used: Rolling walker (2 wheeled) Transfers: Sit to/from Stand Sit to Stand: Min guard         General transfer comment: cues for hand placement and LE management.    Balance                                   ADL                       Lower Body Dressing: Minimal assistance;Sitting/lateral leans;With adaptive equipment   Toilet Transfer: Minimal assistance;Ambulation;RW;BSC   Toileting- Clothing Manipulation and Hygiene: Minimal assistance;Sit to/from stand         General ADL Comments: Pt continues with some pain during session (6-7/10) and reports feeling alittle groggy also. Educated at [facility] on AE for LB dressing. He donned sock with sock aid but would benefit from wide sock aid. Practiced up to 3in1 with walker and needed some cues for how to manuever in tight space in bathroom with walker. Educated on shower  transfer and issued handout but did not practice this session due to pain.       Vision                     Perception     Praxis      Cognition   Behavior During Therapy: WFL for tasks assessed/performed Overall Cognitive Status: Within Functional Limits for tasks assessed                         Exercises     Shoulder Instructions       General Comments      Pertinent Vitals/ Pain       Pain Assessment: 0-10 Pain Score: 7  Pain Location: L hip Pain Descriptors / Indicators: Aching Pain Intervention(s): Monitored during session  Home Living                                          Prior Functioning/Environment              Frequency  Min 2X/week        Progress Toward Goals  OT Goals(current goals can now be found in  the care plan section)  Progress towards OT goals: Progressing toward goals     Plan Discharge plan remains appropriate    Co-evaluation                 End of Session Equipment Utilized During Treatment: Rolling walker  OT Visit Diagnosis: Other abnormalities of gait and mobility (R26.89);Pain Pain - Right/Left: Left Pain - part of body: Hip   Activity Tolerance Patient limited by pain   Patient Left in chair;with call bell/phone within reach   Nurse Communication          Time: 9923-4144 OT Time Calculation (min): 32 min  Charges: OT General Charges $OT Visit: 1 Procedure OT Treatments $Self Care/Home Management : 8-22 mins $Therapeutic Activity: 8-22 mins    Jules Schick 10/13/2016, 11:32 AM

## 2016-10-13 NOTE — Care Management Note (Signed)
Case Management Note  Patient Details  Name: MAJD TISSUE MRN: [id number] Date of Birth: October 09, 1970  Subjective/Objective:     Left THA               Action/Plan:  Discharge Planning:  NCM spoke to pt and preoperatively arranged with [facility] for [facility]. Pt agreeable to [facility]. Pt states he has RW and 3n1 bedside commode at home. His sons will be at home to assist with his care.   PCP [name] MD   Expected Discharge Date:  10/14/2016               Expected Discharge Plan:  [place]  In-House Referral:  NA  Discharge planning Services  CM Consult  Post Acute Care Choice:  Home Health Choice offered to:  Patient  DME Arranged:  N/A DME Agency:  NA  HH Arranged:  PT [place] Agency:  [facility]  Status of Service:  Completed, signed off  If discussed at [employer], dates discussed:    Additional Comments:  Erenest Rasher, RN 10/13/2016, 1:19 PM

## 2016-10-14 ENCOUNTER — Encounter (HOSPITAL_COMMUNITY): Payer: Self-pay | Admitting: Orthopaedic Surgery

## 2016-10-14 LAB — GLUCOSE, CAPILLARY: GLUCOSE-CAPILLARY: 99 mg/dL (ref 65–99)

## 2016-10-14 MED ORDER — AMOXICILLIN-POT CLAVULANATE ER 1000-62.5 MG PO TB12
1.0000 | ORAL_TABLET | Freq: Two times a day (BID) | ORAL | Status: DC
  Filled 2016-10-14: qty 1

## 2016-10-14 NOTE — Progress Notes (Signed)
Physical Therapy Treatment Patient Details Name: Edward [name] MRN: [id number] DOB: 1971-04-14 Today's Date: 10/14/2016    History of Present Illness Pt is a 46 year old male s/p L direct anterior THA with hx of L transmetatarsal amputation 04/2016    PT Comments    POD # 3 Assisted OOB to amb in hallway, practice one step then performed all THR TE's following HEP handout.  Instructed on proper tech, freq as well as use of ICE.  Pt ready for D/C to home   Follow Up Recommendations  Home health PT;Supervision/Assistance - 24 hour     Equipment Recommendations  None recommended by PT    Recommendations for Other Services       Precautions / Restrictions Precautions Precautions: Fall Restrictions Weight Bearing Restrictions: No Other Position/Activity Restrictions: WBAT    Mobility  Bed Mobility Overal bed mobility: Needs Assistance Bed Mobility: Supine to Sit;Sit to Supine     Supine to sit: Supervision Sit to supine: Min assist   General bed mobility comments: assist for L LE on to bed  Transfers Overall transfer level: Needs assistance Equipment used: Rolling walker (2 wheeled) Transfers: Sit to/from Stand Sit to Stand: Min guard         General transfer comment: cues for hand placement and LE management plus increased time  Ambulation/Gait Ambulation/Gait assistance: Supervision;Min guard Ambulation Distance (Feet): 75 Feet Assistive device: Rolling walker (2 wheeled) Gait Pattern/deviations: Step-to pattern;Decreased stance time - left;Antalgic Gait velocity: WFL   General Gait Details: 25% verbal cues for sequence, RW positioning, step length, posture   Stairs Stairs: Yes   Stair Management: No rails;Step to pattern;Forwards;With walker Number of Stairs: 1 General stair comments: 25% VC's on proper walker placement and sequencing  Wheelchair Mobility    Modified Rankin (Stroke Patients Only)       Balance                                     Cognition Arousal/Alertness: Awake/alert Behavior During Therapy: WFL for tasks assessed/performed Overall Cognitive Status: Within Functional Limits for tasks assessed                      Exercises      General Comments        Pertinent Vitals/Pain Pain Assessment: 0-10 Pain Score: 6  Pain Location: L hip Pain Descriptors / Indicators: Operative site guarding;Tightness;Sore Pain Intervention(s): Monitored during session;Repositioned;Ice applied    Home Living                      Prior Function            PT Goals (current goals can now be found in the care plan section) Progress towards PT goals: Progressing toward goals    Frequency    7X/week      PT Plan Current plan remains appropriate    Co-evaluation             End of Session Equipment Utilized During Treatment: Gait belt Activity Tolerance: Patient tolerated treatment well Patient left: in bed Nurse Communication:  (Pt ready for D/C to home) PT Visit Diagnosis: Other abnormalities of gait and mobility (R26.89)     Time: 5188-4166 PT Time Calculation (min) (ACUTE ONLY): 28 min  Charges:  $Gait Training: 8-22 mins $Therapeutic Exercise: 8-22 mins  G Codes:       Rica Koyanagi  PTA [facility]  Acute  Rehab Pager      [phone]

## 2016-10-14 NOTE — Progress Notes (Signed)
Patient eager to be discharged home, patient did not want to eat breakfast or take morning medications. Stating "he would take medications at home." Reviewed discharge education, information and instructions. Patient states understanding and has no questions at this time. PT doing stairs and has cleared patient to be discharged. HHPT set up.

## 2016-10-14 NOTE — Discharge Summary (Signed)
Patient ID: Edward [name] MRN: [id number] DOB/AGE: Jun 20, 1971 46 y.o.  Admit date: 10/11/2016 Discharge date: 10/14/2016  Admission Diagnoses:  Principal Problem:   Avascular necrosis of hip, left [facility]) Active Problems:   Status post left hip replacement   Discharge Diagnoses:  Same  Past Medical History:  Diagnosis Date  . Arthritis   . Diabetes mellitus   . Hypertension     Surgeries: Procedure(s): LEFT TOTAL HIP ARTHROPLASTY ANTERIOR APPROACH on 10/11/2016   Consultants:   Discharged Condition: Improved  Hospital Course: Edward [name] is an 46 y.o. male who was admitted 10/11/2016 for operative treatment ofAvascular necrosis of hip, left ([place]). Patient has severe unremitting pain that affects sleep, daily activities, and work/hobbies. After pre-op clearance the patient was taken to the operating room on 10/11/2016 and underwent  Procedure(s): LEFT TOTAL HIP ARTHROPLASTY ANTERIOR APPROACH.    Patient was given perioperative antibiotics: Anti-infectives    Start     Dose/Rate Route Frequency Ordered Stop   10/14/16 1000  amoxicillin-clavulanate (AUGMENTIN XR) 1000-62.5 MG per 12 hr tablet 1 tablet     1 tablet Oral Every 12 hours 10/14/16 0621     10/12/16 1000  amoxicillin-clavulanate (AUGMENTIN) 500-125 MG per tablet 1,000 mg  Status:  Discontinued     2 tablet Oral 2 times daily 10/11/16 1941 10/14/16 0621   10/12/16 0000  amoxicillin-clavulanate (AUGMENTIN) 500-125 MG tablet     2 tablet Oral 2 times daily 10/12/16 1230     10/11/16 2100  clindamycin (CLEOCIN) IVPB 600 mg     600 mg 100 mL/hr over 30 Minutes Intravenous Every 6 hours 10/11/16 1941 10/12/16 0343   10/11/16 1515  clindamycin (CLEOCIN) IVPB 900 mg     900 mg 100 mL/hr over 30 Minutes Intravenous  Once 10/11/16 1508 10/11/16 1421   10/11/16 1158  ceFAZolin (ANCEF) IVPB 2g/100 mL premix     2 g 200 mL/hr over 30 Minutes Intravenous On call to O.R. 10/11/16 1158 10/11/16 1410       Patient was given  sequential compression devices, early ambulation, and chemoprophylaxis to prevent DVT.  Patient benefited maximally from hospital stay and there were no complications.    Recent vital signs: Patient Vitals for the past 24 hrs:  BP Temp Temp src Pulse Resp SpO2  10/14/16 0543 (!) 172/92 98.8 F (37.1 C) Oral 90 18 100 %  10/13/16 2104 (!) 150/84 99 F (37.2 C) Oral 96 18 98 %  10/13/16 1433 (!) 158/88 98.8 F (37.1 C) Oral 88 16 100 %     Recent laboratory studies:  Recent Labs  10/12/16 0427  WBC 8.7  HGB 12.2*  HCT 36.6*  PLT 231  NA 138  K 4.7  CL 108  CO2 23  BUN 29*  CREATININE 1.57*  GLUCOSE 271*  CALCIUM 8.3*     Discharge Medications:   Allergies as of 10/14/2016   No Known Allergies     Medication List    STOP taking these medications   ibuprofen 200 MG tablet Commonly known as:  ADVIL,MOTRIN     TAKE these medications   amoxicillin-clavulanate 500-125 MG tablet Commonly known as:  AUGMENTIN Take 2 tablets (1,000 mg total) by mouth 2 (two) times daily.   aspirin 81 MG chewable tablet Chew 1 tablet (81 mg total) by mouth 2 (two) times daily.   blood glucose meter kit and supplies Dispense based on patient and insurance preference. Use up to four times daily as directed. (FOR  ICD-9 250.00, 250.01).   gabapentin 100 MG capsule Commonly known as:  NEURONTIN Take 1 capsule (100 mg total) by mouth 3 (three) times daily. Take 1 tablet po hs for 1 week, then bid for 1 week, then tid   Insulin Glargine 100 UNIT/ML Solostar Pen Commonly known as:  LANTUS Inject 40 Units into the skin daily at 10 pm. What changed:  how much to take   Insulin Pen Needle 31G X 5 MM Misc Use daily to inject insulin as instructed   methocarbamol 500 MG tablet Commonly known as:  ROBAXIN Take 1 tablet (500 mg total) by mouth every 6 (six) hours as needed for muscle spasms.   oxyCODONE-acetaminophen 5-325 MG tablet Commonly known as:  ROXICET Take 1-2 tablets by mouth  every 4 (four) hours as needed.   sitaGLIPtin-metformin 50-1000 MG tablet Commonly known as:  JANUMET Take 1 tablet by mouth 2 (two) times daily with a meal.            Durable Medical Equipment        Start     Ordered   10/11/16 1942  DME 3 n 1  Once     10/11/16 1941   10/11/16 1942  DME Walker rolling  Once    Question:  Patient needs a walker to treat with the following condition  Answer:  Status post left hip replacement   10/11/16 1941      Diagnostic Studies: Dg Pelvis Portable  Result Date: 10/11/2016 CLINICAL DATA:  46 year old male status post left anterior approach hip replacement. Initial encounter. EXAM: PORTABLE PELVIS 1-2 VIEWS COMPARISON:  08/21/2016 FINDINGS: Portable AP supine view at 1539 hours. Left bipolar type hip arthroplasty in place. Hardware appears intact and normally aligned in the AP plane. No unexpected osseous changes. Overlying postoperative soft tissue changes including subcutaneous gas and skin staples. IMPRESSION: Left hip arthroplasty with no adverse features. These results will be called to the ordering clinician or representative by the Radiology Department at the imaging location. Electronically Signed   By: Genevie Ann M.D.   On: 10/11/2016 16:13   Dg C-arm 1-60 Min-no Report  Result Date: 10/11/2016 Fluoroscopy was utilized by the requesting physician.  No radiographic interpretation.   Dg Hip Operative Unilat With Pelvis Left  Result Date: 10/11/2016 CLINICAL DATA:  Left hip replacement EXAM: OPERATIVE left HIP (WITH PELVIS IF PERFORMED) 2 VIEWS TECHNIQUE: Fluoroscopic spot image(s) were submitted for interpretation post-operatively. COMPARISON:  08/21/2016 FINDINGS: C-arm images show total hip replacement on the left. Components appear well positioned. No radiographically detectable complication. IMPRESSION: Good appearance following total hip replacement on the left. Electronically Signed   By: Nelson Chimes M.D.   On: 10/11/2016 15:42     Disposition: 06-Home-Health Care Svc  Discharge Instructions    Discharge patient    Complete by:  As directed    Discharge disposition:  01-Home or Self Care   Discharge patient date:  10/14/2016      Follow-up Information    Mcarthur Rossetti, MD Follow up in 2 week(s).   Specialty:  Orthopedic Surgery Contact information: [place] [address] [phone]        [facility] Follow up.   Specialty:  [place] Why:  Home Health Physical Therapy Contact information: [place] [address] [phone]            Signed: Mcarthur Rossetti 10/14/2016, 7:15 AM

## 2016-10-14 NOTE — Progress Notes (Signed)
Patient ID: Edward [name], male   DOB: September 12, 1970, 46 y.o.   MRN: [id number] Doing well overall.   Can be discharged to home today.

## 2016-10-24 ENCOUNTER — Ambulatory Visit (INDEPENDENT_AMBULATORY_CARE_PROVIDER_SITE_OTHER): Payer: BLUE CROSS/BLUE SHIELD | Admitting: Orthopaedic Surgery

## 2016-10-24 DIAGNOSIS — Z96642 Presence of left artificial hip joint: Secondary | ICD-10-CM

## 2016-10-24 NOTE — Progress Notes (Signed)
The patient is now 2 weeks status post a left total hip arthroplasty through direct injury approach. He had severe avascular necrosis of his hip with collapse of the hip ball. He says he feels already much better overall.  His incision looks good. I removed the staples and placed her shows. There was a mild seroma and adrenal about 20 mL of fluid from the incision area. There is no evidence infection all. His leg lengths are equal.  Would continue increase his activities as he tolerates. We'll see him back in a month see how is doing overall but no x-rays are needed. He said he did not need any medicine today.

## 2016-11-15 ENCOUNTER — Ambulatory Visit: Payer: BLUE CROSS/BLUE SHIELD | Admitting: Podiatry

## 2016-11-18 ENCOUNTER — Ambulatory Visit: Payer: BLUE CROSS/BLUE SHIELD | Admitting: Podiatry

## 2016-11-26 ENCOUNTER — Ambulatory Visit (INDEPENDENT_AMBULATORY_CARE_PROVIDER_SITE_OTHER): Payer: BLUE CROSS/BLUE SHIELD | Admitting: Orthopaedic Surgery

## 2016-12-11 ENCOUNTER — Ambulatory Visit (HOSPITAL_COMMUNITY)
Admission: EM | Admit: 2016-12-11 | Discharge: 2016-12-11 | Disposition: A | Discharge status: Home / Self Care | Payer: BLUE CROSS/BLUE SHIELD | Attending: Family Medicine | Admitting: Family Medicine

## 2016-12-11 ENCOUNTER — Encounter (HOSPITAL_COMMUNITY): Payer: Self-pay | Admitting: Emergency Medicine

## 2016-12-11 DIAGNOSIS — L03031 Cellulitis of right toe: Secondary | ICD-10-CM

## 2016-12-11 MED ORDER — KETOCONAZOLE 2 % EX CREA: 1.0000 "application " | TOPICAL_CREAM | Freq: Two times a day (BID) | CUTANEOUS | 0 refills | Status: DC

## 2016-12-11 MED ORDER — DOXYCYCLINE HYCLATE 100 MG PO TABS: 100.0000 mg | ORAL_TABLET | Freq: Two times a day (BID) | ORAL | 0 refills | Status: DC

## 2016-12-11 NOTE — Discharge Instructions (Signed)
Please return in 2 days if you don't see significant improvement with the current medication.

## 2016-12-11 NOTE — ED Triage Notes (Signed)
Woke this morning and noticed right foot with blisters on foot.  Patient does have blisters on toes

## 2016-12-11 NOTE — ED Provider Notes (Signed)
[facility]    CSN: [id number] Arrival date & time: 12/11/16  1235     History   Chief Complaint Chief Complaint  Patient presents with  . Foot Pain    HPI Edward [name] is a 46 y.o. male.   Woke this morning and noticed right foot with blisters on foot.  Patient does have blisters on toes  This 46 year old man is on long-term disability having had his left hip replaced recently. He's a diabetic patient and he's had one toe on the left side amputated in the past.  He woke up this morning with blisters on the tip of his third and fourth toes on the right foot. He's having absolutely no pain. His having no discharge or irritation elsewhere on the foot.      Past Medical History:  Diagnosis Date  . Arthritis   . Diabetes mellitus   . Hypertension     Patient Active Problem List   Diagnosis Date Noted  . Avascular necrosis of hip, left ([place]) 10/11/2016  . Status post left hip replacement 10/11/2016  . Unilateral primary osteoarthritis, left hip 09/18/2016  . S/P transmetatarsal amputation of foot, left ([place]) 07/25/2016  . Balance problem 07/25/2016  . Sepsis ([place]) 05/07/2016  . ARF (acute renal failure) ([place]) 05/07/2016  . Fournier gangrene 08/16/2011  . Acute respiratory failure ([place]) 08/16/2011  . Septic shock(785.52) 08/16/2011  . Diabetic osteomyelitis ([place]) 08/16/2011    Past Surgical History:  Procedure Laterality Date  . AMPUTATION Left 05/10/2016   Procedure: Left Transmetatarsal Amputation;  Surgeon: [name], MD;  Location: [facility];  Service: Orthopedics;  Laterality: Left;  . CIRCUMCISION  09/02/2011   Procedure: CIRCUMCISION ADULT;  Surgeon: Molli Hazard, MD;  Location: [place];  Service: Urology;  Laterality: N/A;  . I&D EXTREMITY  09/02/2011   Procedure: IRRIGATION AND DEBRIDEMENT EXTREMITY;  Surgeon: Theodoro Kos, DO;  Location: [facility];  Service: Plastics;  Laterality: N/A;  . INCISION AND  DRAINAGE OF WOUND  08/12/2011   Procedure: IRRIGATION AND DEBRIDEMENT WOUND;  Surgeon: Zenovia Jarred, MD;  Location: [facility];  Service: General;;  perineum  . IRRIGATION AND DEBRIDEMENT ABSCESS  08/12/2011   Procedure: IRRIGATION AND DEBRIDEMENT ABSCESS;  Surgeon: Molli Hazard, MD;  Location: [facility];  Service: Urology;  Laterality: N/A;  irrigation and debridment perineum    . IRRIGATION AND DEBRIDEMENT ABSCESS  08/14/2011   Procedure: MINOR INCISION AND DRAINAGE OF ABSCESS;  Surgeon: Molli Hazard, MD;  Location: [facility];  Service: Urology;  Laterality: N/A;  Perineal Wound Debridement;Placement Bilateral Testicular Thigh Pouches  . TOTAL HIP ARTHROPLASTY Left 10/11/2016   Procedure: LEFT TOTAL HIP ARTHROPLASTY ANTERIOR APPROACH;  Surgeon: Mcarthur Rossetti, MD;  Location: [facility];  Service: Orthopedics;  Laterality: Left;       Home Medications    Prior to Admission medications   Medication Sig Start Date End Date Taking? Authorizing Provider  blood glucose meter kit and supplies Dispense based on patient and insurance preference. Use up to four times daily as directed. (FOR ICD-9 250.00, 250.01). 05/13/16   Barton Dubois, MD  doxycycline (VIBRA-TABS) 100 MG tablet Take 1 tablet (100 mg total) by mouth 2 (two) times daily. 12/11/16   Robyn Haber, MD  Insulin Glargine (LANTUS) 100 UNIT/ML Solostar Pen Inject 40 Units into the skin daily at 10 pm. Patient taking differently: Inject 45 Units into the skin daily at 10 pm.  05/13/16  Barton Dubois, MD  Insulin Pen Needle 31G X 5 MM MISC Use daily to inject insulin as instructed 05/13/16   Barton Dubois, MD  ketoconazole (NIZORAL) 2 % cream Apply 1 application topically 2 (two) times daily. 12/11/16   Robyn Haber, MD  sitaGLIPtin-metformin (JANUMET) 50-1000 MG tablet Take 1 tablet by mouth 2 (two) times daily with a meal.    Historical Provider, MD    Family History Family History  Problem Relation Age of Onset  .  Diabetes Other     Social History Social History  Substance Use Topics  . Smoking status: Current Every Day Smoker    Types: Cigarettes  . Smokeless tobacco: Never Used  . Alcohol use 0.0 oz/week     Comment: has not had alcohol in a month     Allergies   Patient has no known allergies.   Review of Systems Review of Systems  Skin: Positive for rash.  All other systems reviewed and are negative.    Physical Exam Triage Vital Signs ED Triage Vitals [12/11/16 1310]  Enc Vitals Group     BP (!) 142/93     Pulse Rate (!) 103     Resp 20     Temp 98.6 F (37 C)     Temp Source Oral     SpO2 100 %     Weight      Height      Head Circumference      Peak Flow      Pain Score      Pain Loc      Pain Edu?      Excl. in [place]?    No data found.   Updated Vital Signs BP (!) 142/93 (BP Location: Right Arm) Comment (BP Location): large cuff  Pulse (!) 103   Temp 98.6 F (37 C) (Oral)   Resp 20   SpO2 100%    Physical Exam  Constitutional: He is oriented to person, place, and time. He appears well-developed and well-nourished.  HENT:  Right Ear: External ear normal.  Left Ear: External ear normal.  Mouth/Throat: Oropharynx is clear and moist.  Eyes: Conjunctivae and EOM are normal. Pupils are equal, round, and reactive to light.  Neck: Normal range of motion. Neck supple.  Pulmonary/Chest: Effort normal.  Musculoskeletal: Normal range of motion. He exhibits no tenderness.  Neurological: He is alert and oriented to person, place, and time.  Skin: Skin is warm and dry.  Patient has fluid-filled blisters at the tips of his third and fourth toes on the right foot. They are nontender, there is no exudate between the toes in the web spaces, and the foot is otherwise unremarkable. His good pedal pulse.  Nursing note and vitals reviewed.    UC Treatments / Results  Labs (all labs ordered are listed, but only abnormal results are displayed) Labs Reviewed - No data to  display  EKG  EKG Interpretation None       Radiology No results found.  Procedures Procedures (including critical care time)  Medications Ordered in UC Medications - No data to display   Initial Impression / Assessment and Plan / UC Course  I have reviewed the triage vital signs and the nursing notes.  Pertinent labs & imaging results that were available during my care of the patient were reviewed by me and considered in my medical decision making (see chart for details).     Final Clinical Impressions(s) / UC Diagnoses   Final  diagnoses:  Cellulitis of right toe    New Prescriptions New Prescriptions   DOXYCYCLINE (VIBRA-TABS) 100 MG TABLET    Take 1 tablet (100 mg total) by mouth 2 (two) times daily.   KETOCONAZOLE (NIZORAL) 2 % CREAM    Apply 1 application topically 2 (two) times daily.     Robyn Haber, MD 12/11/16 1326

## 2016-12-30 DIAGNOSIS — M79676 Pain in unspecified toe(s): Secondary | ICD-10-CM

## 2017-01-14 ENCOUNTER — Encounter (HOSPITAL_COMMUNITY): Payer: Self-pay | Admitting: Emergency Medicine

## 2017-01-14 ENCOUNTER — Ambulatory Visit (HOSPITAL_COMMUNITY)
Admission: EM | Admit: 2017-01-14 | Discharge: 2017-01-14 | Disposition: A | Discharge status: Home / Self Care | Payer: BLUE CROSS/BLUE SHIELD | Attending: Emergency Medicine | Admitting: Emergency Medicine

## 2017-01-14 DIAGNOSIS — J029 Acute pharyngitis, unspecified: Secondary | ICD-10-CM | POA: Diagnosis present

## 2017-01-14 DIAGNOSIS — R030 Elevated blood-pressure reading, without diagnosis of hypertension: Secondary | ICD-10-CM

## 2017-01-14 DIAGNOSIS — F17210 Nicotine dependence, cigarettes, uncomplicated: Secondary | ICD-10-CM | POA: Diagnosis present

## 2017-01-14 MED ORDER — MAGIC MOUTHWASH W/LIDOCAINE: 5.0000 mL | Freq: Three times a day (TID) | ORAL | 0 refills | Status: DC | PRN

## 2017-01-14 NOTE — ED Triage Notes (Signed)
Patient has a sore throat, complains of uvula gagging him.  Onset Sunday of symptoms.  Minimal runny nose, denies cough, denies fever

## 2017-01-14 NOTE — ED Provider Notes (Signed)
CSN: [id number]     Arrival date & time 01/14/17  1933 History   None    Chief Complaint  Patient presents with  . Sore Throat   (Consider location/radiation/quality/duration/timing/severity/associated sxs/prior Treatment) The history is provided by the patient. No language interpreter was used.  Sore Throat  This is a new problem. The current episode started 2 days ago. The problem occurs constantly. The problem has not changed since onset.Pertinent negatives include no chest pain, no abdominal pain and no headaches. Associated symptoms comments: Pt reports uvula swelling. The symptoms are aggravated by swallowing. Nothing relieves the symptoms. He has tried nothing for the symptoms. The treatment provided no relief.    Past Medical History:  Diagnosis Date  . Arthritis   . Diabetes mellitus   . Hypertension    Past Surgical History:  Procedure Laterality Date  . AMPUTATION Left 05/10/2016   Procedure: Left Transmetatarsal Amputation;  Surgeon: [name], MD;  Location: [facility];  Service: Orthopedics;  Laterality: Left;  . CIRCUMCISION  09/02/2011   Procedure: CIRCUMCISION ADULT;  Surgeon: Molli Hazard, MD;  Location: [facility];  Service: Urology;  Laterality: N/A;  . I&D EXTREMITY  09/02/2011   Procedure: IRRIGATION AND DEBRIDEMENT EXTREMITY;  Surgeon: Theodoro Kos, DO;  Location: [facility];  Service: Plastics;  Laterality: N/A;  . INCISION AND DRAINAGE OF WOUND  08/12/2011   Procedure: IRRIGATION AND DEBRIDEMENT WOUND;  Surgeon: Zenovia Jarred, MD;  Location: [facility];  Service: General;;  perineum  . IRRIGATION AND DEBRIDEMENT ABSCESS  08/12/2011   Procedure: IRRIGATION AND DEBRIDEMENT ABSCESS;  Surgeon: Molli Hazard, MD;  Location: [facility];  Service: Urology;  Laterality: N/A;  irrigation and debridment perineum    . IRRIGATION AND DEBRIDEMENT ABSCESS  08/14/2011   Procedure: MINOR INCISION AND DRAINAGE OF ABSCESS;  Surgeon: Molli Hazard, MD;  Location: [facility];  Service: Urology;  Laterality: N/A;  Perineal Wound Debridement;Placement Bilateral Testicular Thigh Pouches  . TOTAL HIP ARTHROPLASTY Left 10/11/2016   Procedure: LEFT TOTAL HIP ARTHROPLASTY ANTERIOR APPROACH;  Surgeon: Mcarthur Rossetti, MD;  [facility];  Service: Orthopedics;  Laterality: Left;   Family History  Problem Relation Age of Onset  . Diabetes Other    Social History  Substance Use Topics  . Smoking status: Current Every Day Smoker    Types: Cigarettes  . Smokeless tobacco: Never Used  . Alcohol use 0.0 oz/week     Comment: has not had alcohol in a month    Review of Systems  Constitutional: Negative for fever.  HENT: Positive for sore throat. Negative for trouble swallowing and voice change.   Eyes: Negative.   Respiratory: Negative.   Cardiovascular: Negative.  Negative for chest pain.  Gastrointestinal: Negative.  Negative for abdominal pain.  Endocrine: Negative.   Genitourinary: Negative.   Musculoskeletal: Negative.   Skin: Negative.   Allergic/Immunologic: Negative.   Neurological: Negative.  Negative for headaches.  Hematological: Negative.   Psychiatric/Behavioral: Negative.   All other systems reviewed and are negative.   Allergies  Patient has no known allergies.  Home Medications   Prior to Admission medications   Medication Sig Start Date End Date Taking? Authorizing Provider  blood glucose meter kit and supplies Dispense based on patient and insurance preference. Use up to four times daily as directed. (FOR ICD-9 250.00, 250.01). 05/13/16   Barton Dubois, MD  doxycycline (VIBRA-TABS) 100 MG tablet Take 1 tablet (100 mg total) by  mouth 2 (two) times daily. 12/11/16   Robyn Haber, MD  Insulin Glargine (LANTUS) 100 UNIT/ML Solostar Pen Inject 40 Units into the skin daily at 10 pm. Patient taking differently: Inject 45 Units into the skin daily at 10 pm.  05/13/16   Barton Dubois, MD  Insulin Pen  Needle 31G X 5 MM MISC Use daily to inject insulin as instructed 05/13/16   Barton Dubois, MD  ketoconazole (NIZORAL) 2 % cream Apply 1 application topically 2 (two) times daily. 12/11/16   Robyn Haber, MD  magic mouthwash w/lidocaine SOLN Take 5 mLs by mouth 3 (three) times daily as needed for mouth pain. 5/94/70   Landon Bassford, Jeanett Schlein, NP  sitaGLIPtin-metformin (JANUMET) 50-1000 MG tablet Take 1 tablet by mouth 2 (two) times daily with a meal.    [provider]   Meds Ordered and Administered this Visit  Medications - No data to display  BP (!) 155/96 (BP Location: Right Arm)   Pulse 98   Temp 98.5 F (36.9 C) (Oral)   Resp 20   SpO2 98%  No data found.   Physical Exam  Constitutional: He is oriented to person, place, and time. He appears well-developed and well-nourished. He is active and cooperative.  HENT:  Head: Normocephalic and atraumatic.  Right Ear: Tympanic membrane normal.  Left Ear: Tympanic membrane normal.  Nose: Nose normal.  Mouth/Throat: Uvula is midline and mucous membranes are normal. Oral lesions present. No uvula swelling. Posterior oropharyngeal erythema present.    Eyes: Conjunctivae are normal.  Neck: Normal range of motion. Neck supple. No tracheal deviation present.  Cardiovascular: Normal rate and regular rhythm.   No murmur heard. Pulmonary/Chest: Effort normal and breath sounds normal. No respiratory distress.  Abdominal: Soft. There is no tenderness.  Musculoskeletal: He exhibits no edema.  Neurological: He is alert and oriented to person, place, and time. GCS eye subscore is 4. GCS verbal subscore is 5. GCS motor subscore is 6.  Skin: Skin is warm and dry.  Psychiatric: He has a normal mood and affect.  Nursing note and vitals reviewed.   Urgent Care Course     Procedures (including critical care time)  Labs Review Labs Reviewed - No data to display  Imaging Review No results found.       MDM   1. Viral pharyngitis    2. Cigarette smoker   3. Elevated blood pressure reading     Rest,push fluids, use magic mouthwash as directed. Follow up with PCP for recheck. Stop smoking. Return to UC as needed. Pt verbalized understanding to this provider.    Tori Milks, NP 76/15/18 1152

## 2017-01-14 NOTE — Discharge Instructions (Addendum)
Rest,push fluids, use magic mouthwash as directed. Follow up with PCP for recheck. Stop smoking. Return to UC as needed.

## 2017-01-17 ENCOUNTER — Encounter: Payer: Self-pay | Admitting: Podiatry

## 2017-01-17 ENCOUNTER — Telehealth: Payer: Self-pay | Admitting: *Deleted

## 2017-01-17 ENCOUNTER — Other Ambulatory Visit: Payer: Self-pay | Admitting: Podiatry

## 2017-01-17 ENCOUNTER — Ambulatory Visit (HOSPITAL_COMMUNITY)
Admission: RE | Admit: 2017-01-17 | Discharge: 2017-01-17 | Disposition: A | Discharge status: Home / Self Care | Payer: Self-pay | Source: Ambulatory Visit | Attending: Cardiology | Admitting: Cardiology

## 2017-01-17 ENCOUNTER — Inpatient Hospital Stay (HOSPITAL_COMMUNITY): Admission: RE | Admit: 2017-01-17 | Payer: Self-pay | Source: Ambulatory Visit

## 2017-01-17 ENCOUNTER — Ambulatory Visit (INDEPENDENT_AMBULATORY_CARE_PROVIDER_SITE_OTHER): Payer: Self-pay | Admitting: Podiatry

## 2017-01-17 ENCOUNTER — Ambulatory Visit (INDEPENDENT_AMBULATORY_CARE_PROVIDER_SITE_OTHER): Payer: Self-pay

## 2017-01-17 DIAGNOSIS — I739 Peripheral vascular disease, unspecified: Secondary | ICD-10-CM

## 2017-01-17 DIAGNOSIS — L97511 Non-pressure chronic ulcer of other part of right foot limited to breakdown of skin: Secondary | ICD-10-CM

## 2017-01-17 DIAGNOSIS — E1149 Type 2 diabetes mellitus with other diabetic neurological complication: Secondary | ICD-10-CM

## 2017-01-17 MED ORDER — CEPHALEXIN 500 MG PO CAPS: 500.0000 mg | ORAL_CAPSULE | Freq: Three times a day (TID) | ORAL | 2 refills | Status: DC

## 2017-01-17 MED ORDER — SILVER SULFADIAZINE 1 % EX CREA: 1.0000 "application " | TOPICAL_CREAM | Freq: Every day | CUTANEOUS | 0 refills | Status: DC

## 2017-01-17 NOTE — Telephone Encounter (Signed)
Dr. Jacqualyn Posey ordered ABI with TBI and Arterial Dopplers as soon as possible for black toes, and nonpalpable pulses. Felicia - [facility] initially stated 1st available is 01/28/2017, then that she had a cancellation to day at 1:30pm arrive 1:15pm. Left message for pt to call if he was available. I informed Solmon Ice I was unable to inform pt of the appt and she states she could not hold the slot for today for pt. I called pt again and informed of the appt available for today and he states he will be able to take the appt,, and informed pt of [place]. I informed Felicia - [facility] pt would take the appt.

## 2017-01-21 ENCOUNTER — Ambulatory Visit (INDEPENDENT_AMBULATORY_CARE_PROVIDER_SITE_OTHER): Payer: Self-pay | Admitting: Cardiovascular Disease

## 2017-01-21 ENCOUNTER — Encounter: Payer: Self-pay | Admitting: Cardiovascular Disease

## 2017-01-21 VITALS — BP 130/82 | HR 86 | Ht 73.0 in | Wt 240.2 lb

## 2017-01-21 DIAGNOSIS — N493 Fournier gangrene: Secondary | ICD-10-CM

## 2017-01-21 DIAGNOSIS — I10 Essential (primary) hypertension: Secondary | ICD-10-CM

## 2017-01-21 NOTE — Assessment & Plan Note (Signed)
[name] was referred by Dr. Earleen Newport for discoloration of several right toes and absent pedal pulses. He has had a left transmetatarsal amputation by Dr. Sharol Given  September of last year. He denies claudication. Recent Dopplers performed in our office 01/17/17 revealed no evidence of obstructive disease with relatively normal ABIs bilaterally.

## 2017-01-21 NOTE — Patient Instructions (Signed)
Medication Instructions: Your physician recommends that you continue on your current medications as directed. Please refer to the Current Medication list given to you today.   Follow-Up: Your physician recommends that you schedule a follow-up appointment as needed with Dr. Berry.    

## 2017-01-21 NOTE — Progress Notes (Signed)
01/21/2017 Edward [name]   1971/03/03  [id number]  Primary Physician [name], MD Primary Cardiologist: Lorretta Harp MD Renae Gloss  HPI:  Edward [name] is a very pleasant 46 year old mildly overweight widowed African-American male father of 2 sons currently does not work. He was referred by Dr. Jacqualyn Posey, his podiatrist, because of poorly palpable pedal pulses. He has a long history of diabetes. He smokes a half a pack a day for last 25 years. Never had a heart attack or stroke. Denies chest pain shortness of breath or claudication. He had a left transmetatarsal amputation by Dr. Sharol Given September last year. He does have gangrenous changes on several toes on his right foot. Dr. Jacqualyn Posey apparently could not find a pulse. Dopplers performed on 01/17/17 revealed ABIs 0.93 on the right, 0.96 on the left without evidence of obstructive disease.His tibial vessels were intact. He did have some stenosis in his mid right anterior tibial artery.   Current Outpatient Prescriptions  Medication Sig Dispense Refill  . aspirin 81 MG chewable tablet CHEW 1 T PO BID  0  . blood glucose meter kit and supplies Dispense based on patient and insurance preference. Use up to four times daily as directed. (FOR ICD-9 250.00, 250.01). 1 each 0  . cephALEXin (KEFLEX) 500 MG capsule Take 1 capsule (500 mg total) by mouth 3 (three) times daily. 30 capsule 2  . doxycycline (VIBRA-TABS) 100 MG tablet Take 1 tablet (100 mg total) by mouth 2 (two) times daily. 20 tablet 0  . Insulin Glargine (LANTUS) 100 UNIT/ML Solostar Pen Inject 40 Units into the skin daily at 10 pm. (Patient taking differently: Inject 45 Units into the skin daily at 10 pm. ) 15 mL 3  . Insulin Pen Needle 31G X 5 MM MISC Use daily to inject insulin as instructed 100 each 2  . ketoconazole (NIZORAL) 2 % cream Apply 1 application topically 2 (two) times daily. 30 g 0  . magic mouthwash w/lidocaine SOLN Take 5 mLs by mouth 3 (three) times daily  as needed for mouth pain. 60 mL 0  . methocarbamol (ROBAXIN) 500 MG tablet TK 1 T PO Q 6 H PRN FOR MUSCLE SPASMS  0  . silver sulfADIAZINE (SILVADENE) 1 % cream Apply 1 application topically daily. 50 g 0  . sitaGLIPtin-metformin (JANUMET) 50-1000 MG tablet Take 1 tablet by mouth 2 (two) times daily with a meal.     No current facility-administered medications for this visit.     No Known Allergies  Social History   Social History  . Marital status: Widowed    Spouse name: N/A  . Number of children: N/A  . Years of education: N/A   Occupational History  . Not on file.   Social History Main Topics  . Smoking status: Current Every Day Smoker    Types: Cigarettes  . Smokeless tobacco: Never Used  . Alcohol use 0.0 oz/week     Comment: has not had alcohol in a month  . Drug use: No  . Sexual activity: Not on file   Other Topics Concern  . Not on file   Social History Narrative  . No narrative on file     Review of Systems: General: negative for chills, fever, night sweats or weight changes.  Cardiovascular: negative for chest pain, dyspnea on exertion, edema, orthopnea, palpitations, paroxysmal nocturnal dyspnea or shortness of breath Dermatological: negative for rash Respiratory: negative for cough or wheezing Urologic: negative for hematuria  Abdominal: negative for nausea, vomiting, diarrhea, bright red blood per rectum, melena, or hematemesis Neurologic: negative for visual changes, syncope, or dizziness All other systems reviewed and are otherwise negative except as noted above.    Blood pressure 130/82, pulse 86, height 6' 1"  (1.854 m), weight 240 lb 3.2 oz (109 kg).  General appearance: alert and no distress Neck: no adenopathy, no carotid bruit, no JVD, supple, symmetrical, trachea midline and thyroid not enlarged, symmetric, no tenderness/mass/nodules Lungs: clear to auscultation bilaterally Heart: regular rate and rhythm, S1, S2 normal, no murmur, click, rub  or gallop Extremities: extremities normal, atraumatic, no cyanosis or edema  EKG sinus rhythm at 86 and occasionally EACs. I personally reviewed this EKG  ASSESSMENT AND PLAN:   Fournier's gangrene [name] was referred by Dr. Earleen Newport for discoloration of several right toes and absent pedal pulses. He has had a left transmetatarsal amputation by Dr. Sharol Given  September of last year. He denies claudication. Recent Dopplers performed in our office 01/17/17 revealed no evidence of obstructive disease with relatively normal ABIs bilaterally.      Lorretta Harp MD FACP,FACC,FAHA, [facility] 01/21/2017 4:25 PM

## 2017-01-22 NOTE — Progress Notes (Signed)
Subjective: 46 year old male presents the office today for concerns of "water blisters" we should been ongoing for about 1 e states that the blisters did pop and drain in the toes is started to turn dark. He went to the emergency and was prescribed doxycycline and ketoconazole cream. He denies any drainage or pus and denies any redness or red streaks. Denies any systemic complaints such as fevers, chills, nausea, vomiting. No acute changes since last appointment, and no other complaints at this time.   Objective: AAO x3, NAD DP pulse palpable, PT pulse decreased Protective sensation absent with Derrel Nip monofilament To the distal aspect of the third, fourth, fifth digits what appears to be old blisters with granular wounds present. There is hyperpigmentation changes the third, fourth, fifth digits as well. No drainage or pus is expressed. There is mild swelling. There is no swelling erythema or ascending synovitis. No areas of pinpoint bony tenderness or pain with vibratory sensation. MMT 5/5, ROM WNL. No edema, erythema, increase in warmth to bilateral lower extremities.  No open lesions or pre-ulcerative lesions.  No pain with calf compression, swelling, warmth, erythema        Assessment: Wounds right foot with gangrenous changes  Plan: -All treatment options discussed with the patient including all alternatives, risks, complications.  -Given the decreased pulse as well as skin changes I recommended arterial studies to performed as soon as possible. Also we'll place a consult with Dr. Gwenlyn Found. -continue daily dressing changes for now. Silvadene daily -Keflex  -Surgical shoe- he has this at home and he is to go into it immediately -Monitor for any clinical signs or symptoms of infection and directed to call the office immediately should any occur or go to the ER. -RTC 1 week or sooner if needed.  Celesta Gentile, DPM -Patient encouraged to call the office with any questions,  concerns, change in symptoms.

## 2017-01-24 ENCOUNTER — Ambulatory Visit (INDEPENDENT_AMBULATORY_CARE_PROVIDER_SITE_OTHER): Payer: Self-pay | Admitting: Podiatry

## 2017-01-24 ENCOUNTER — Encounter: Payer: Self-pay | Admitting: Podiatry

## 2017-01-24 VITALS — BP 100/75 | HR 106

## 2017-01-24 DIAGNOSIS — L97511 Non-pressure chronic ulcer of other part of right foot limited to breakdown of skin: Secondary | ICD-10-CM

## 2017-01-24 DIAGNOSIS — E1149 Type 2 diabetes mellitus with other diabetic neurological complication: Secondary | ICD-10-CM

## 2017-01-24 DIAGNOSIS — M79676 Pain in unspecified toe(s): Secondary | ICD-10-CM

## 2017-01-24 DIAGNOSIS — B351 Tinea unguium: Secondary | ICD-10-CM

## 2017-01-28 NOTE — Progress Notes (Signed)
Subjective: 46 year old male presents the office they for concerns and follow-up evaluation of wounds the third to fifth toes in the right foot. He did follow with Dr. Alvester Chou and felt to have adequate circulation for healing to the right foot. He states he isconcerned the peel off on the toes. The wounds to continue to the tips the toes but denies any drainage or pus. Denies any increase in warmth or swelling or any red streaks. His also asking for his nails be trimmed on the right foot as they're causing pain. Denies any systemic complaints such as fevers, chills, nausea, vomiting. No acute changes since last appointment, and no other complaints at this time.   Objective: AAO x3, NAD DP/PT pulses decreased bilaterally Protective sensation decreased with Simms Weinstein monofilament To the distal aspect the right third through fifth digits a superficial granular wound present. There is also hyperpigmented changes to the digits most notable at the fifth toe. This appears to be worn a scab today as opposed to true gangrene. The skin is starting to peel off as well. After debridement there is new healthy skin underneath the area. There is no drainage or pus expressed and there is no sepsis or crepitus. There is no malodor. There is no ascending cellulitis. On the right foot hypertrophic, dystrophic, discolored, elongated 5. Subjective tenderness to nails 1-5 on the right foot. Transmetatarsal amputation the left foot any ulceration. No areas of pinpoint bony tenderness or pain with vibratory sensation. MMT 5/5, ROM WNL. No edema, erythema, increase in warmth to bilateral lower extremities.  No open lesions or pre-ulcerative lesions.  No pain with calf compression, swelling, warmth, erythema  Assessment: Ulcerations with hyperpigmented skin changes the right foot with onychomycosis  Plan: -All treatment options discussed with the patient including all alternatives, risks, complications.  -Nail sharply  debrided 5 without complications or bleeding. -I lightly debrided the loose skin along the toes to the right foot. Also lightly debrided hyperkeratotic tissue along the ulcerations without any complications or bleeding. -Continue with daily dressing changes to the right foot. For now continue Silvadene dressing changes. Continue a surgical shoe. -Hopefully we can save the toes and this foot discussed with him potential for transmetatarsal amputation. -Monitor for any clinical signs or symptoms of infection and directed to call the office immediately should any occur or go to the ER. -RTC in 1 week or sooner if needed. -Patient encouraged to call the office with any questions, concerns, change in symptoms.   Celesta Gentile, DPM

## 2017-01-31 ENCOUNTER — Encounter: Payer: Self-pay | Admitting: Podiatry

## 2017-01-31 ENCOUNTER — Ambulatory Visit (INDEPENDENT_AMBULATORY_CARE_PROVIDER_SITE_OTHER): Payer: Self-pay | Admitting: Podiatry

## 2017-01-31 DIAGNOSIS — E1149 Type 2 diabetes mellitus with other diabetic neurological complication: Secondary | ICD-10-CM

## 2017-01-31 DIAGNOSIS — L97511 Non-pressure chronic ulcer of other part of right foot limited to breakdown of skin: Secondary | ICD-10-CM

## 2017-02-03 NOTE — Progress Notes (Signed)
Subjective: 46 year old male presents the office today for follow-up evaluation of wounds, hyperpigmented changes to the third through fifth digits and the right foot. He has continued on Keflex. His continue with Silvadene cream daily. He has remained in the surgical shoe. Denies any pus denies any increase in swelling or redness or any red streaks. He feels he is doing about the same compared to last appointment. Denies any systemic complaints such as fevers, chills, nausea, vomiting. No acute changes since last appointment, and no other complaints at this time.   Objective: AAO x3, NAD DP/PT pulses decreased bilaterally See pictures below however superficial granular wound still present to the distal aspect of the third digit. The wounds to the distal aspect of the fourth and fifth digits axial to be improved. There is epidermolysis mostly to the fourth and fifth digits and upon debridement there is new skin present and appear to be more normal in color. There is no drainage or pus expressed there is minimal swelling to the foot and there is no surrounding erythema or ascending synovitis. Is no drainage or pus expressed. No open lesions or pre-ulcerative lesions.  No pain with calf compression, swelling, warmth, erythema        Assessment: Ulceration right third toe with hyperpigmented skin changes.  Plan: -All treatment options discussed with the patient including all alternatives, risks, complications.  -Debrided the loose epidermolysis from the digits. 2 injection. Be improved and overall the skin color is actually improved underneath the skin that was debrided. Continue with Keflex. Continue Silvadene dressing changes daily. Continue a surgical shoe. Discussed the potential for amputation again today however since there is no clinical signs of infection he is making progress of this we will continue with local wound care. -RTC 1 week. -Monitor for any clinical signs or symptoms of  infection and directed to call the office immediately should any occur or go to the ER. -Patient encouraged to call the office with any questions, concerns, change in symptoms.   Celesta Gentile, DPM

## 2017-02-27 ENCOUNTER — Ambulatory Visit (INDEPENDENT_AMBULATORY_CARE_PROVIDER_SITE_OTHER): Payer: Self-pay

## 2017-02-27 ENCOUNTER — Encounter: Payer: Self-pay | Admitting: Podiatry

## 2017-02-27 ENCOUNTER — Ambulatory Visit (INDEPENDENT_AMBULATORY_CARE_PROVIDER_SITE_OTHER): Payer: Self-pay | Admitting: Podiatry

## 2017-02-27 DIAGNOSIS — L02611 Cutaneous abscess of right foot: Secondary | ICD-10-CM

## 2017-02-27 DIAGNOSIS — L03031 Cellulitis of right toe: Secondary | ICD-10-CM

## 2017-02-27 DIAGNOSIS — L97511 Non-pressure chronic ulcer of other part of right foot limited to breakdown of skin: Secondary | ICD-10-CM

## 2017-02-27 DIAGNOSIS — R52 Pain, unspecified: Secondary | ICD-10-CM

## 2017-03-02 LAB — WOUND CULTURE
Gram Stain: NONE SEEN
Gram Stain: NONE SEEN
ORGANISM ID, BACTERIA: NO GROWTH

## 2017-03-02 NOTE — Progress Notes (Signed)
Subjective: 46 year old male presents the office today for follow-up evaluation of wounds, hyperpigmented changes to the third through fifth digits and the right foot. He states the toes are doing better but he has noticed a blister form on the outside aspect of the right foot. He states that he was walking more and wearing socks which may have aggravated the symptoms. Denies any possible he is noticing denies any increase in swelling or redness or any red streaks to his feet. His remained in the surgical shoe on the right foot.  Denies any systemic complaints such as fevers, chills, nausea, vomiting. No acute changes since last appointment, and no other complaints at this time.   Objective: AAO x3, NAD DP/PT pulses decreased bilaterally The coloration to the digits on the right foot appears to much improved. There is hyperkeratotic lesions present the distal aspect of the third, fourth, fifth digits of the right foot. Upon debridement the wounds actually are healing and there is made much progress in a good direction towards the wound on the right foot. I'm pleased with her labral today. However there is a blister present on the lateral aspect of the foot which does seem to communicate or hyperkeratotic lesion submetatarsal 5. Upon drainage there was purulence expressed coming from the blister in this area was cultured. This area was debrided to reveal underlying superficial wound along the lateral aspect of the right foot the fifth metatarsal head however on the plantar aspect submetatarsal 5 with a blister communicated there is a superficial granular wound measuring approximately 2.5 x 1.5 cm. There is no swelling erythema, ascending cellulitis however there is is mild edema. There is normal proximal distal cooling of the foot is no significant increase in warmth to the foot. No other open lesions or pre-ulcerative lesions are identified today. No pain with calf compression, swelling, warmth,  erythema  Assessment: Ulceration right third toe with hyperpigmented skin changes which is improved however new blister, superficial abscess right foot  Plan: -All treatment options discussed with the patient including all alternatives, risks, complications.  -I'm very happy the way the toes look today there is made much progress. However the blister was drained on the right foot and the area was cultured. There was underlying superficial wound present. Continue the small amount of Silvadene dressing changes daily. Offloading pads. Continue with surgical shoe. Blood work was ordered today. Continue antibiotics, Keflex. -RTC 1 week. -Monitor for any clinical signs or symptoms of infection and directed to call the office immediately should any occur or go to the ER. -Patient encouraged to call the office with any questions, concerns, change in symptoms.   Celesta Gentile, DPM

## 2017-03-06 ENCOUNTER — Encounter: Payer: Self-pay | Admitting: Podiatry

## 2017-03-06 ENCOUNTER — Ambulatory Visit (INDEPENDENT_AMBULATORY_CARE_PROVIDER_SITE_OTHER): Payer: Self-pay | Admitting: Podiatry

## 2017-03-06 DIAGNOSIS — L97511 Non-pressure chronic ulcer of other part of right foot limited to breakdown of skin: Secondary | ICD-10-CM

## 2017-03-13 NOTE — Progress Notes (Signed)
Subjective: Edward [name] presents the also for follow-up evaluation of superficial abscess the right foot as well as for wounds the right foot. He's been applying Silvadene cream to the wounds daily. Denies any drainage or pus. Denies any pain. Denies any increase in swelling or redness to his feet. He's remained in the surgical shoe. Denies any systemic complaints such as fevers, chills, nausea, vomiting. No acute changes since last appointment, and no other complaints at this time.   Objective: AAO x3, NAD DP/PT pulses palpable bilaterally, CRT less than 3 seconds Regards the digits there are small scabs the plantar distal aspect of the toes her other slowly improving. There is no drainage or pus there is no probing, undermining or tunneling. There is no significant edema. On the plantar aspect of the right foot second metatarsal 5 into the lateral aspect of the fifth metatarsal head continues to be superficial granular wound. There is no change or pus expressed there is no swelling erythema, ascending cellulitis. There is no fluctuance, crepitus, malodor. There is no clinical signs of infection noted today. No open lesions or pre-ulcerative lesions.  No pain with calf compression, swelling, warmth, erythema          Assessment: Superficial wounds right foot status post drainage from superficial abscess  Plan: -All treatment options discussed with the patient including all alternatives, risks, complications.  -Wounds were lightly debrided today with a scalpel without any complications. -Recommend he continue with Silvadene dressing changes daily. -Finish course of antibiotics. -RTC 2 weeks -Monitor for any clinical signs or symptoms of infection and directed to call the office immediately should any occur or go to the ER. -Patient encouraged to call the office with any questions, concerns, change in symptoms.   Celesta Gentile, DPM

## 2017-03-20 ENCOUNTER — Encounter: Payer: Self-pay | Admitting: Podiatry

## 2017-03-20 ENCOUNTER — Ambulatory Visit (INDEPENDENT_AMBULATORY_CARE_PROVIDER_SITE_OTHER): Payer: Self-pay | Admitting: Podiatry

## 2017-03-20 VITALS — BP 148/92 | HR 92 | Temp 98.2°F | Resp 18

## 2017-03-20 DIAGNOSIS — L97511 Non-pressure chronic ulcer of other part of right foot limited to breakdown of skin: Secondary | ICD-10-CM

## 2017-03-20 NOTE — Patient Instructions (Signed)
Continue silvadene dressing changes daily. Can leave it uncovered or a dry dressing if at home. Keep foot elevated.  Monitor for any signs/symptoms of infection. Call the office immediately if any occur or go directly to the emergency room. Call with any questions/concerns.  Good luck to your mom next week. If there is anything we can do for you, please let us know.

## 2017-03-20 NOTE — Progress Notes (Signed)
Subjective: Edward [name] presents the also for follow-up evaluation of ulceration to his right foot. He states he is doing well. Denies any drainage or pus any increase in swelling or redness. His continue Silvadene dressing changes daily. He's reaming surgical shoe. Denies any systemic complaints such as fevers, chills, nausea, vomiting. No acute changes since last appointment, and no other complaints at this time.   He states his mom was recently diagnosed with pancreatic cancer and scheduled for surgery next week.  Objective: AAO x3, NAD DP/PT pulses palpable bilaterally, CRT less than 3 seconds On the plantar aspect the right foot continues be granular ulceration submetatarsal 5 as well as lateral fifth metatarsal head. The rest of the evidence of healing. Mild hyperkeratotic periwound. There is no drainage or pus expressed. No surrounding erythema, ascending cellulitis. There is no no fluctuance or crepitus or malodor. No other open lesions or pre-ulcerative lesions.  No pain with calf compression, swelling, warmth, erythema           Assessment: Superficial wounds right foot with evidence of healing, no signs of infection  Plan: -All treatment options discussed with the patient including all alternatives, risks, complications.  -Wounds were lightly debrided today with a scalpel without any complications. -Recommend he continue with Silvadene dressing changes daily. -Offloading -RTC 1-2 weeks -Monitor for any clinical signs or symptoms of infection and directed to call the office immediately should any occur or go to the ER. -Patient encouraged to call the office with any questions, concerns, change in symptoms.   Celesta Gentile, DPM

## 2017-04-10 ENCOUNTER — Encounter: Payer: Self-pay | Admitting: Podiatry

## 2017-04-10 ENCOUNTER — Ambulatory Visit (INDEPENDENT_AMBULATORY_CARE_PROVIDER_SITE_OTHER): Payer: Self-pay | Admitting: Podiatry

## 2017-04-10 DIAGNOSIS — L97511 Non-pressure chronic ulcer of other part of right foot limited to breakdown of skin: Secondary | ICD-10-CM

## 2017-04-10 NOTE — Progress Notes (Signed)
Subjective: Edward [name] presents the also for follow-up evaluation of ulceration to his right foot. He states that he is dong well. He has continued with the silvadene dressing changes daily. He states he typically wears his surgical shoe but not today. Denies any drainage, pus, swelling, redness.  Denies any systemic complaints such as fevers, chills, nausea, vomiting. No acute changes since last appointment, and no other complaints at this time.   Objective: AAO x3, NAD; presents today wearing a croc shoe DP/PT pulses palpable bilaterally, CRT less than 3 seconds On the plantar aspect the right foot continues be granular ulceration submetatarsal 5  Which measures 0.5 x 0.3 x 0.1 cm. The wound along the lateral fifth metatarsal head has healed. Hyperkeratotic periwound and along the lateral foot. There is no drainage or pus expressed. No surrounding erythema, ascending cellulitis. There is no no fluctuance or crepitus or malodor. No other open lesions or pre-ulcerative lesions.  No pain with calf compression, swelling, warmth, erythema      Assessment: Healing wound right foot  Plan: -All treatment options discussed with the patient including all alternatives, risks, complications.  -Wounds were lightly debrided today with a scalpel without any complications -Recommend he continue with Silvadene dressing changes daily. -No signs of infection today.  -Offloading -RTC 2 weeks -Monitor for any clinical signs or symptoms of infection and directed to call the office immediately should any occur or go to the ER. -Patient encouraged to call the office with any questions, concerns, change in symptoms.   Celesta Gentile, DPM

## 2017-04-25 ENCOUNTER — Encounter: Payer: Self-pay | Admitting: Podiatry

## 2017-04-25 ENCOUNTER — Ambulatory Visit (INDEPENDENT_AMBULATORY_CARE_PROVIDER_SITE_OTHER): Payer: Self-pay | Admitting: Podiatry

## 2017-04-25 DIAGNOSIS — L97511 Non-pressure chronic ulcer of other part of right foot limited to breakdown of skin: Secondary | ICD-10-CM

## 2017-04-25 DIAGNOSIS — E1149 Type 2 diabetes mellitus with other diabetic neurological complication: Secondary | ICD-10-CM

## 2017-04-25 NOTE — Progress Notes (Signed)
Subjective: Edward [name] presents the also for follow-up evaluation of ulceration to his right foot. He states he has been doing well denies any drainage or pus or any swelling. He was having some discomfort along the area but has not noted to the wound is getting worse. His been using Silvadene dressing changes daily to the area. He has no new concerns.  Denies any systemic complaints such as fevers, chills, nausea, vomiting. No acute changes since last appointment, and no other complaints at this time.   Objective: AAO x3, NAD; presents today wearing a croc shoe DP/PT pulses palpable bilaterally, CRT less than 3 seconds On the plantar aspect the right foot continues be granular ulceration submetatarsal 5  Which measures 0.3 x 0.2 x 0.1 cm. The wound along the lateral fifth metatarsal head has healed. Hyperkeratotic periwound and along the lateral foot submetatarsal 5 encompassing the lateral aspect of the fifth metatarsal head as well. There is no drainage or pus expressed. No surrounding erythema, ascending cellulitis. There is no no fluctuance or crepitus or malodor. No other open lesions or pre-ulcerative lesions.  No pain with calf compression, swelling, warmth, erythema  Assessment: Healing wound right foot  Plan: -All treatment options discussed with the patient including all alternatives, risks, complications.  -The wound was sharply debrided to the any complications to healthy tissue. Recommended small amount of Silvadene dressing changes daily. -Also able to debride hyperkeratotic tissue without any underlying ulceration.  -Offloading- recommend to wear surgical shoe.  -RTC 2 weeks -Monitor for any clinical signs or symptoms of infection and directed to call the office immediately should any occur or go to the ER. -Patient encouraged to call the office with any questions, concerns, change in symptoms.   Celesta Gentile, DPM

## 2017-05-09 ENCOUNTER — Ambulatory Visit (INDEPENDENT_AMBULATORY_CARE_PROVIDER_SITE_OTHER): Payer: Self-pay | Admitting: Podiatry

## 2017-05-09 ENCOUNTER — Encounter: Payer: Self-pay | Admitting: Podiatry

## 2017-05-09 DIAGNOSIS — E1149 Type 2 diabetes mellitus with other diabetic neurological complication: Secondary | ICD-10-CM

## 2017-05-09 DIAGNOSIS — L97511 Non-pressure chronic ulcer of other part of right foot limited to breakdown of skin: Secondary | ICD-10-CM

## 2017-05-09 DIAGNOSIS — I739 Peripheral vascular disease, unspecified: Secondary | ICD-10-CM

## 2017-05-12 NOTE — Progress Notes (Signed)
Subjective: Edward [name] presents the also for follow-up evaluation of ulceration to his right foot. He states he is doing well. He has had no drainage or pus and denies any increase in swelling or redness to his feet. He states that overall he is doing well and he has no new concerns today.He also states he has cramping to his legs mostly his left leg feels tight. He see orthopedics for his left leg.Denies any systemic complaints such as fevers, chills, nausea, vomiting. No acute changes since last appointment, and no other complaints at this time.   Objective: AAO x3, NAD; presents today wearing a croc shoe DP/PT pulses palpable bilaterally, CRT less than 3 seconds On the plantar aspect the right foot continues be granular ulceration submetatarsal 5  measuring 0.2 x 0.2 x 0.1 cm. The wound along the lateral fifth metatarsal head has healed.There is significantly improved hyperkeratotic tissue along the plantar lateral aspect of the foot. There is no slipping edema there is no erythema or increase in warmth. There is no clinical signs of infection present. All the areas to the toes of healed. There is no open lesions or pre-ulcerative lesions to the left foot. No pain with calf compression, swelling, warmth, erythema  Assessment: Noninfectedwound right foot  Plan: -All treatment options discussed with the patient including all alternatives, risks, complications.  -The wound was sharply debrided to the any complications to healthy tissue. Recommended small amount of Silvadene dressing changes daily. There is much improved hyperkeratotic tissue on the area today and overall Edward [name] appears to be doing much better than where it was at the last several visits -Edema surgical shoe and offloading. He is wearing a regular sandal today. -Giving the pain to the back of his left leg I did do an arterial study in the office today which revealed an ABI in the left side of 1.11 and the right is 1.19. I do not think that  his symptoms are from claudication. -Monitor for any clinical signs or symptoms of infection and directed to call the office immediately should any occur or go to the ER.  -RTC 3 weeks -Patient encouraged to call the office with any questions, concerns, change in symptoms.   Celesta Gentile, DPM

## 2017-05-14 DIAGNOSIS — M79676 Pain in unspecified toe(s): Secondary | ICD-10-CM

## 2017-05-30 ENCOUNTER — Encounter: Payer: Self-pay | Admitting: Podiatry

## 2017-05-30 ENCOUNTER — Ambulatory Visit (INDEPENDENT_AMBULATORY_CARE_PROVIDER_SITE_OTHER): Payer: Self-pay | Admitting: Podiatry

## 2017-05-30 DIAGNOSIS — E1149 Type 2 diabetes mellitus with other diabetic neurological complication: Secondary | ICD-10-CM

## 2017-05-30 DIAGNOSIS — L97511 Non-pressure chronic ulcer of other part of right foot limited to breakdown of skin: Secondary | ICD-10-CM

## 2017-05-30 NOTE — Progress Notes (Signed)
Subjective: Deshane presents the also for follow-up evaluation of ulceration to his right foot. He states he is doing well. He states that he has not been putting the cream on his foot. He denies any swelling redness or any drainage. He has no new concerns today.Denies any systemic complaints such as fevers, chills, nausea, vomiting. No acute changes since last appointment, and no other complaints at this time.   Objective: AAO x3, NAD; presents today wearing a croc shoe DP/PT pulses palpable bilaterally, CRT less than 3 seconds On the plantar aspect the right foot continues be granular ulceration submetatarsal 5  measuring 0.5 x 0.5 x 0.3 cm. there is hyperkeratotic tissue along the area. There is no probing to bone, undermining or tunneling. There is no swelling erythema, ascending cellulitis. There is no fluctuance or crepitus. There is no malodor. The wound is. Be somewhat lethargic compared to last appointment. There is no clinical signs of infection present. No pain with calf compression, swelling, warmth, erythema  Assessment: Noninfectedwound right foot  Plan: -All treatment options discussed with the patient including all alternatives, risks, complications.  -The wound was sharply debrided to the any complications to healthy tissue. Continue Silvadene dressing changes. He states CRT has this at home he is has not been using it. Continue with offloading pads as well. -Monitor for any clinical signs or symptoms of infection and directed to call the office immediately should any occur or go to the ER. -RTC 2 weeks or sooner if needed.  Celesta Gentile, DPM

## 2017-06-13 ENCOUNTER — Ambulatory Visit (INDEPENDENT_AMBULATORY_CARE_PROVIDER_SITE_OTHER): Payer: Self-pay | Admitting: Podiatry

## 2017-06-13 ENCOUNTER — Encounter: Payer: Self-pay | Admitting: Podiatry

## 2017-06-13 DIAGNOSIS — E1149 Type 2 diabetes mellitus with other diabetic neurological complication: Secondary | ICD-10-CM

## 2017-06-13 DIAGNOSIS — L97511 Non-pressure chronic ulcer of other part of right foot limited to breakdown of skin: Secondary | ICD-10-CM

## 2017-06-16 NOTE — Progress Notes (Signed)
Subjective: Edward [name] presents the also for follow-up evaluation of ulceration to his right foot. He states he is doing well. He states she is continuing with Silvadene to the wound daily. Denies any drainage or pus or any swelling or redness or any pain to the right foot. He states that his left foot is feeling "soft". He denies any swelling or open sores or any calluses that he has noticed her left foot. No recent injury to the left foot. He has no other concerns today. Denies any systemic complaints such as fevers, chills, nausea, vomiting. No acute changes since last appointment, and no other complaints at this time.   Objective: AAO x3, NAD; presents today wearing a croc shoe DP/PT pulses palpable bilaterally, CRT less than 3 seconds On the plantar aspect the right foot continues be granular ulceration submetatarsal 5  measuring 0.5 x 0.4 x 0.2 cm. There is hyperkeratotic tissue along the area. There is no probing to bone, undermining or tunneling. There is no surrounding erythema, ascending cellulitis. There is no fluctuance or crepitus. There is no malodor. After debridement the wound is granular. There are no clinical signs of infection present. On the left foot there is no open lesions or pre-ulcer lesions identified. The incision from the previous TMA is well-healed. There is prominence the metatarsal distally towards the fifth and he states this would he can feel but there is no skin breakdown identified. No pain with calf compression, swelling, warmth, erythema  Assessment: Noninfectedwound right foot; prominent metatarsal left  Plan: -All treatment options discussed with the patient including all alternatives, risks, complications.  -The wound was sharply debrided to the any complications to healthy tissue. Continue Silvadene dressing changes. He states CRT has this at home he is has not been using it. Continue with offloading pads as well. -Discussed with her inserts for her shoes to help  take pressure off the area. Given cost she is to start with a gel insert to wear and work and try get him an insert made for her shoes. -Monitor for any clinical signs or symptoms of infection and directed to call the office immediately should any occur or go to the ER. -RTC 2 weeks or sooner if needed.  Celesta Gentile, DPM

## 2017-06-27 ENCOUNTER — Ambulatory Visit: Payer: Self-pay | Admitting: Podiatry

## 2017-07-15 ENCOUNTER — Ambulatory Visit (INDEPENDENT_AMBULATORY_CARE_PROVIDER_SITE_OTHER): Payer: Self-pay | Admitting: Podiatry

## 2017-07-15 ENCOUNTER — Encounter: Payer: Self-pay | Admitting: Podiatry

## 2017-07-15 DIAGNOSIS — L97511 Non-pressure chronic ulcer of other part of right foot limited to breakdown of skin: Secondary | ICD-10-CM

## 2017-07-15 DIAGNOSIS — E1149 Type 2 diabetes mellitus with other diabetic neurological complication: Secondary | ICD-10-CM

## 2017-07-15 MED ORDER — SILVER SULFADIAZINE 1 % EX CREA: 1.0000 "application " | TOPICAL_CREAM | Freq: Every day | CUTANEOUS | 0 refills | Status: DC

## 2017-07-15 MED ORDER — CEPHALEXIN 500 MG PO CAPS: 500.0000 mg | ORAL_CAPSULE | Freq: Three times a day (TID) | ORAL | 0 refills | Status: DC

## 2017-07-15 NOTE — Progress Notes (Signed)
Subjective: Edward [name] presents the also for follow-up evaluation of ulceration to his right foot.  He states that he had to miss his last appointment.  He has been keeping Silvadene on it.  He did try the cream on a daily.  He has not noticed any drainage or pus or any swelling or redness to his feet.  He has no other concerns today. He has no other concerns today. Denies any systemic complaints such as fevers, chills, nausea, vomiting. No acute changes since last appointment, and no other complaints at this time.   Objective: AAO x3, NAD; presents today wearing a croc shoe DP/PT pulses palpable bilaterally, CRT less than 3 seconds On the plantar aspect the right foot has a large hyperkeratotic tissue with central wound.  After debridement the wound measures 1 x 1 x 0.1 cm with a granular wound base.  There is no probing, undermining or tunneling.  There is no surrounding erythema, ascending cellulitis.  There is no malodor today although I think this is from the significant buildup of the hyperkeratotic tissue and there is macerated tissue underneath the callus.  There is no fluctuation or crepitation.  There is no other open lesions or pre-ulcerative lesion identified today. No wounds on the left foot incision is well-healed from a prior TMA No pain with calf compression, swelling, warmth, erythema  Assessment: Chronic wound right symmetrical 5 with new malodor today  Plan: -All treatment options discussed with the patient including all alternatives, risks, complications.  The wound was sharply debrided today without complications of healthy, granular tissue.  Given the malodor will start with Keflex this was prescribed for him today.  Also refill Silvadene and continue daily dressing changes.-Continue offloading at all times. -Monitor for any clinical signs or symptoms of infection and directed to call the office immediately should any occur or go to the ER. -RTC in 10 days or sooner if  needed.  Celesta Gentile, DPM

## 2017-07-29 ENCOUNTER — Ambulatory Visit (INDEPENDENT_AMBULATORY_CARE_PROVIDER_SITE_OTHER): Payer: Self-pay | Admitting: Podiatry

## 2017-07-29 DIAGNOSIS — E1149 Type 2 diabetes mellitus with other diabetic neurological complication: Secondary | ICD-10-CM

## 2017-07-29 DIAGNOSIS — L97511 Non-pressure chronic ulcer of other part of right foot limited to breakdown of skin: Secondary | ICD-10-CM

## 2017-07-29 NOTE — Progress Notes (Signed)
Subjective: Edward [name] presents the also for follow-up evaluation of ulceration to his right foot. He states that he has been doing well. He has been putting Silvadene on the wound daily as well as moisturizer to his feet in general.  Denies any drainage or pus. Denies any increase in swelling or redness to his feet.  He is doing well. Denies any systemic complaints such as fevers, chills, nausea, vomiting. No acute changes since last appointment, and no other complaints at this time.   His mom passed away yesterday and he is upset about this.   Objective: AAO x3, NAD; presents today wearing a croc shoe DP/PT pulses palpable bilaterally, CRT less than 3 seconds On the plantar aspect the right foot has a large hyperkeratotic tissue with central wound.  After debridement the wound measures 0.5 x 0.5 x 0.2 cm with a granular wound base.  There is no probing, undermining or tunneling.  There is no surrounding erythema, ascending cellulitis.  There is no malodor identified today.  There is no edema to the foot there is no erythema, ascending cellulitis.  There is no clinical signs of infection noted today. No other open lesions or pre-ulcerative lesions identified. No wounds on the left foot incision is well-healed from a prior TMA No pain with calf compression, swelling, warmth, erythema  Assessment: Chronic wound right sub metatarsal 5 with improvement  Plan: -All treatment options discussed with the patient including all alternatives, risks, complications.  -The wound was sharply debrided today without complications of healthy, granular tissue.  No signs of infection today and the malodor has resolved.  -Continue Silvadene to the wound daily. -Monitor for any clinical signs or symptoms of infection and directed to call the office immediately should any occur or go to the ER. -RTC in 2 weeks or sooner if needed.  Celesta Gentile, DPM

## 2017-08-14 ENCOUNTER — Ambulatory Visit (INDEPENDENT_AMBULATORY_CARE_PROVIDER_SITE_OTHER): Payer: Self-pay | Admitting: Podiatry

## 2017-08-14 DIAGNOSIS — L97511 Non-pressure chronic ulcer of other part of right foot limited to breakdown of skin: Secondary | ICD-10-CM

## 2017-08-14 DIAGNOSIS — E1149 Type 2 diabetes mellitus with other diabetic neurological complication: Secondary | ICD-10-CM

## 2017-08-14 MED ORDER — CEPHALEXIN 500 MG PO CAPS: 500.0000 mg | ORAL_CAPSULE | Freq: Three times a day (TID) | ORAL | 0 refills | Status: DC

## 2017-08-18 NOTE — Progress Notes (Signed)
Subjective: Mr. Simkin presents the office today for new concerns of a wound to the right second toe.  He was wearing dress socks  and regular shoes he thinks this is what started the wound of the second toe.  This happened last week.  He has noticed a little bit of bloody to clear drainage but denies any pus.  He has gone back to wear an open toed shoe.  He denies any recent injury or trauma. Denies any systemic complaints such as fevers, chills, nausea, vomiting. No acute changes since last appointment, and no other complaints at this time.   Objective: AAO x3, NAD DP/PT pulses palpable bilaterally, CRT less than 3 seconds Hyperkeratotic lesion right side metatarsal 5.  Upon debridement the wound measures about the same size of 0.5 x 0.5 x 0.2 cm.  There is a new wound to the lateral aspect of the right second toe measuring approximately 1 x 1 cm and has almost a sharp To the area.,  No clear drainage is expressed but there is no pus.  Trace edema to the toe but there is no ascending cellulitis or erythema.  There is no fluctuance or crepitus.  There is no malodor. No open lesions or pre-ulcerative lesions.  No pain with calf compression, swelling, warmth, erythema  Assessment: New ulceration right second toe with chronic ulceration right submetatarsal 5  Plan: -All treatment options discussed with the patient including all alternatives, risks, complications.  -Patient presents today wearing a regular flip-flop.  I want him to continue in surgical shoe but she has not been wearing.  I sharply debrided after verbal consent was obtained of the right side metatarsal 5 area.  Continue with Silvadene dressing changes daily.  Also need to continue this on the second toe as well.  Will start Keflex due to the drainage.  I did not culture the area as this would be a superficial culture I believe to be a contaminant. Monitor for any clinical signs or symptoms of infection and directed to call the office  immediately should any occur or go to the ER. -Follow-up as scheduled or sooner if needed. -Patient encouraged to call the office with any questions, concerns, change in symptoms.   Trula Slade DPM

## 2017-08-19 DIAGNOSIS — I219 Acute myocardial infarction, unspecified: Secondary | ICD-10-CM

## 2017-08-19 HISTORY — DX: Acute myocardial infarction, unspecified: I21.9

## 2017-08-28 ENCOUNTER — Encounter: Payer: Self-pay | Admitting: Podiatry

## 2017-08-28 ENCOUNTER — Ambulatory Visit (INDEPENDENT_AMBULATORY_CARE_PROVIDER_SITE_OTHER): Payer: Self-pay | Admitting: Podiatry

## 2017-08-28 DIAGNOSIS — E1149 Type 2 diabetes mellitus with other diabetic neurological complication: Secondary | ICD-10-CM

## 2017-08-28 DIAGNOSIS — L97511 Non-pressure chronic ulcer of other part of right foot limited to breakdown of skin: Secondary | ICD-10-CM

## 2017-08-31 NOTE — Progress Notes (Signed)
Subjective: Edward [name] presents the office today for follow-up evaluation of a wound to the right second toe as well as right submetatarsal 5.  He states that he has noticed some bleeding to the right second toe but denies any pus.  Is been using Silvadene on both areas daily.  He denies any increase in swelling or any redness or right pus.  No pain.  He has no other concerns today. Denies any systemic complaints such as fevers, chills, nausea, vomiting. No acute changes since last appointment, and no other complaints at this time.   Objective: AAO x3, NAD Presents today wearing surgical shoe DP/PT pulses palpable bilaterally, CRT less than 3 seconds Left transmetatarsal amputation which is well-healed Hyperkeratotic lesion right submetatarsal 5.  Upon debridement the wound appears to be almost completely healed at this time.  There is no probing, undermining or tunneling. To the distal lateral aspect of the right second toe continues to be ulceration measuring about the same size of 1 x 1 cm.  There is more granulation tissue which is hyper granular to the toe today.  No drainage or pus is expressed.  Mild swelling to the toe but there is no ascending cellulitis.  There is no erythema to the toe. No open lesions or pre-ulcerative lesions.  No pain with calf compression, swelling, warmth, erythema      Assessment: Right foot ulcerations x2 with swelling to the right second toe  Plan: -All treatment options discussed with the patient including all alternatives, risks, complications.  -Applied a small amount of silver nitrate to the hyper granulation tissue to the right second toe today.  I want him to switch to using a small amount of Betadine to the wound daily.  He can continue Silvadene to the right submetatarsal 5 wound.  That wound was also sharply debrided today to healthy, granular tissue. -Keflex -Monitor for any clinical signs or symptoms of infection and directed to call the office  immediately should any occur or go to the ER. -RTC 1 week or sooner if needed. -Patient encouraged to call the office with any questions, concerns, change in symptoms.  He has no further questions or concerns today  Trula Slade DPM

## 2017-09-11 ENCOUNTER — Ambulatory Visit (INDEPENDENT_AMBULATORY_CARE_PROVIDER_SITE_OTHER): Payer: Medicaid Other

## 2017-09-11 ENCOUNTER — Encounter: Payer: Self-pay | Admitting: Podiatry

## 2017-09-11 ENCOUNTER — Ambulatory Visit (INDEPENDENT_AMBULATORY_CARE_PROVIDER_SITE_OTHER): Payer: Self-pay | Admitting: Podiatry

## 2017-09-11 DIAGNOSIS — L97511 Non-pressure chronic ulcer of other part of right foot limited to breakdown of skin: Secondary | ICD-10-CM

## 2017-09-14 MED ORDER — CEPHALEXIN 500 MG PO CAPS: 500.0000 mg | ORAL_CAPSULE | Freq: Three times a day (TID) | ORAL | 0 refills | Status: DC

## 2017-09-14 NOTE — Progress Notes (Signed)
Subjective: Coston presents the office today for follow-up evaluation of a wound to the right second toe as well as right submetatarsal 5.  He states that he has been continue with Silvadene dressing changes daily.  He has not on any antibiotics at this point.  Denies any drainage or pus or any increase in swelling or redness.  Denies any pus.  He states that overall his feet feeling cinderblocks and this is not new.  He has been wearing a tennis shoe that he is wearing today.  He has not been wearing the surgical shoe all the time.  He has no other concerns today.  Denies any systemic complaints such as fevers, chills, nausea, vomiting. No acute changes since last appointment, and no other complaints at this time.   Objective: AAO x3, NAD Presents today wearing surgical shoe DP/PT pulses palpable bilaterally, CRT less than 3 seconds Left transmetatarsal amputation which is well-healed Hyperkeratotic lesion right submetatarsal 5.  Wound appears to be almost healed at this time.  There is no drainage or pus.  No fluctuation or crepitation. To the distal lateral aspect of the right second toe continues to be ulceration measuring1 x 1 cm and appears to be about the same size however there is more hyperpigmented skin to the toe and there is epidermal lysis along the digit.  There is no pus expressed there is no ascending sialitis.  There is no fluctuation or crepitation.  There is no malodor. No open lesions or pre-ulcerative lesions.  No pain with calf compression, swelling, warmth, erythema        Assessment: Right foot ulcerations x2   Plan: -All treatment options discussed with the patient including all alternatives, risks, complications. -X-rays were obtained and reviewed.  No definitive evidence of a cortical destruction suggestive of osteomyelitis and there is no soft tissue emphysema present. -Toe does appear to be worsened.  Will start antibiotics. Keflex -We will switch to Betadine  wet-to-dry to the toe. -Discussed possible amputation of the digit and he understands that this is a possibility.  -Monitor for any clinical signs or symptoms of infection and directed to call the office immediately should any occur or go to the ER. -RTC 1 week or sooner if needed. -Patient encouraged to call the office with any questions, concerns, change in symptoms.  He has no further questions or concerns today  Trula Slade DPM

## 2017-09-19 ENCOUNTER — Ambulatory Visit (INDEPENDENT_AMBULATORY_CARE_PROVIDER_SITE_OTHER): Payer: Medicaid Other | Admitting: Podiatry

## 2017-09-19 DIAGNOSIS — L97511 Non-pressure chronic ulcer of other part of right foot limited to breakdown of skin: Secondary | ICD-10-CM

## 2017-09-19 DIAGNOSIS — L928 Other granulomatous disorders of the skin and subcutaneous tissue: Secondary | ICD-10-CM

## 2017-09-19 NOTE — Progress Notes (Signed)
  Subjective:  Patient ID: Edward [name], male    DOB: Apr 14, 1971,  MRN: [id number]  No chief complaint on file.  47 y.o. male returns for wound care. Has been taking his antibiotic as directed.. Denies N/V/F/Ch. Presents ambulating today in his surgical shoe.  Objective:  There were no vitals filed for this visit. General AA&O x3. Normal mood and affect.  Vascular Foot warm to touch.  Neurologic Sensation grossly diminished.  Dermatologic (Wound) Wound Location: Rt. foot 2nd toe Wound Measurement: 1 x 0.5 superficial Wound Base: Granular/Healthy Peri-wound: Calloused Exudate: None: wound tissue dry  As above post-debridement.   Wound progress: Improved since last check.  Wound Location: Rt. foot fifth metatarsal Wound Measurement: 0.5 x 0.7 superficial Wound Base: Granular/Healthy Peri-wound: Calloused Exudate: None: wound tissue dry  Wound progress: Decreased since last check.  No warmth, erythema, ascending cellulitis at either wound.  No signs of acute infection  Orthopedic: Left foot midfoot amputation noted. Hammertoe deformities noted right foot   Assessment & Plan:  Patient was evaluated and treated and all questions answered.  Ulcer right second toe -Debridement as below.  Granulation tissue cauterized with silver nitrate to promote epithelialization -Dressed with Silvadene, DSD. -Continue antibiotics to completion.  No signs of infection today.  Ulcer right fifth metatarsal -Excisionally debrided as below -Dressed with Silvadene, DSD -Continue offloading surgical shoe.  May need to adjust padding at next visit due to continued hyperkeratosis formation  Procedure: Excisional Debridement of Wound - 2nd toe ulceration, 5th metatarsal ulceration. Rationale: Removal of non-viable soft tissue from the wound to promote healing.  Anesthesia: none Pre-Debridement Wound Measurements: 2nd toe 0.3 x 0.3 pre-debridement; 5th metatarsal covered with  hyperkeratosis Post-Debridement Wound Measurements: 2nd toe 1 x 0.5, metatarsal 0.5 x 0.7  Type of Debridement: Excisional Tissue Removed: Non-viable soft tissue Depth of Debridement: Subcutaneous tissue Instrumentation: 312 blade Technique: Sharp excisional debridement to bleeding, viable wound base.  Dressing: Dry, sterile, compression dressing. Disposition: Patient tolerated procedure well. Patient to return in 1 week for follow-up.  Procedure: Chemical Cauterization of Granulation Tissue - 2nd toe ulceration. Rationale: Cauterize granular wound base to promote healing.  Wound Measurements: 1 x 0.5 superficial Instrumentation: Silver nitrate stick x3 Dressing: Dry, sterile, compression dressing. Disposition: Patient tolerated procedure well. Patient to return in 1 week for follow-up.  Return in about 2 weeks (around 10/03/2017) for Wound Care.

## 2017-10-07 ENCOUNTER — Ambulatory Visit: Payer: Medicaid Other | Admitting: Podiatry

## 2017-10-16 ENCOUNTER — Ambulatory Visit (INDEPENDENT_AMBULATORY_CARE_PROVIDER_SITE_OTHER): Payer: Medicaid Other | Admitting: Podiatry

## 2017-10-16 ENCOUNTER — Encounter: Payer: Self-pay | Admitting: Podiatry

## 2017-10-16 ENCOUNTER — Ambulatory Visit (INDEPENDENT_AMBULATORY_CARE_PROVIDER_SITE_OTHER): Payer: Medicaid Other

## 2017-10-16 DIAGNOSIS — E1149 Type 2 diabetes mellitus with other diabetic neurological complication: Secondary | ICD-10-CM

## 2017-10-16 DIAGNOSIS — L02619 Cutaneous abscess of unspecified foot: Secondary | ICD-10-CM

## 2017-10-16 DIAGNOSIS — L03119 Cellulitis of unspecified part of limb: Secondary | ICD-10-CM

## 2017-10-16 DIAGNOSIS — L97519 Non-pressure chronic ulcer of other part of right foot with unspecified severity: Secondary | ICD-10-CM

## 2017-10-16 MED ORDER — AMOXICILLIN-POT CLAVULANATE 875-125 MG PO TABS: 1.0000 | ORAL_TABLET | Freq: Two times a day (BID) | ORAL | 0 refills | Status: DC

## 2017-10-20 LAB — WOUND CULTURE
MICRO NUMBER:: 90262500
SPECIMEN QUALITY:: ADEQUATE

## 2017-10-20 NOTE — Progress Notes (Signed)
Subjective: Edward [name] presents the office today for follow-up evaluation of a wound to the right foot.  He states that the second toe is very dry.  He has not noticed any drainage or pus coming from the wound he denies any increase in swelling or redness to his feet.  He has not been putting ointment on the wounds.  He is currently not on any antibiotics. Denies any systemic complaints such as fevers, chills, nausea, vomiting. No acute changes since last appointment, and no other complaints at this time.   Objective: AAO x3, NAD DP/PT pulses palpable bilaterally, CRT less than 3 seconds Right foot submetatarsal 5 the hyperkeratotic lesion with a central ulceration.  There does appear to be a small amount of fluid underneath the area and after debridement purulence was expressed and this was cultured.  The wound does measure about 3 x 3 cm today but there is no probing to bone, undermining or tunneling.  After debridement the wound wound base is granular.  There is no fluctuation or crepitation otherwise.  There is no surrounding erythema, ascending sialitis.  There is no malodor. On the right third toe the plantar distal aspect is dry eschar.  I was able to debride some of this today and there is no drainage or pus and the only small superficial wound remains the second toe plantarly. No open lesions or pre-ulcerative lesions.  No pain with calf compression, swelling, warmth, erythema  Assessment: Ulceration right foot x2   Plan: -All treatment options discussed with the patient including all alternatives, risks, complications.  -After verbal consent was obtained the areas were cleaned of both of the areas were sharply debrided with a #312 blade scalpel.  The right submetatarsal 5 ulceration was excisionally debrided as this is medically necessary due to infection to remove all hyperkeratotic tissue and to debride the wound to healthy tissue.  There was purulence expressed and the wound was cultured  today.  The area was cleaned and there was no further purulence identified.  We will switch to Betadine wet-to-dry dressings to this area.  Prescribed Augmentin.  Offloading at all times. -X-rays were obtained and reviewed.  No definitive evidence of acute osteomyelitis or soft tissue emphysema. -RTC 1 week or sooner if needed. -Patient encouraged to call the office with any questions, concerns, change in symptoms.   Trula Slade DPM

## 2017-10-21 ENCOUNTER — Other Ambulatory Visit: Payer: Self-pay | Admitting: Podiatry

## 2017-10-21 MED ORDER — CIPROFLOXACIN HCL 500 MG PO TABS: 500.0000 mg | ORAL_TABLET | Freq: Two times a day (BID) | ORAL | 0 refills | Status: DC

## 2017-10-21 NOTE — Progress Notes (Signed)
Will add cipro given culture.  Patient states the wound is closing. Only a small amount of blood, no pus. No fevers or chills  Follow-up as scheduled or sooner if needed.  Trula Slade

## 2017-10-23 ENCOUNTER — Encounter: Payer: Self-pay | Admitting: Podiatry

## 2017-10-23 ENCOUNTER — Ambulatory Visit (INDEPENDENT_AMBULATORY_CARE_PROVIDER_SITE_OTHER): Payer: Medicaid Other | Admitting: Podiatry

## 2017-10-23 DIAGNOSIS — L97519 Non-pressure chronic ulcer of other part of right foot with unspecified severity: Secondary | ICD-10-CM

## 2017-10-27 NOTE — Progress Notes (Signed)
Subjective: Edward [name] presents the office today for follow-up evaluation of a wound to the right foot.  He feels that the wound is getting better.  He is still on the antibiotics.  He denies seeing any drainage or pus and denies any increase in redness and swelling to his feet.  He has no new concerns today. Denies any systemic complaints such as fevers, chills, nausea, vomiting. No acute changes since last appointment, and no other complaints at this time.   Objective: AAO x3, NAD DP/PT pulses palpable bilaterally, CRT less than 3 seconds On the left foot there is no open sores identified and transmetatarsal dictation site is well-healed. On the right foot that measures 5 is a hyperkeratotic lesion with a central ulceration.  Upon debridement the wound is much improved today measuring about 1 x 1 cm and there is no probing, undermining or tunneling.  There is no surrounding erythema, ascending cellulitis.  There is no fluctuation or crepitation.  There is no malodor. Darkened discoloration to the right third toe.  Upon debridement some hyperkeratotic tissue there was no underlying ulceration of the third toe identified today.  Also after debridement there is new, pink skin underneath the hyper pigmented skin. No open lesions or pre-ulcerative lesions.  No pain with calf compression, swelling, warmth, erythema      Assessment: Ulceration right foot  Plan: -All treatment options discussed with the patient including all alternatives, risks, complications.  -Postoperative day 2 without any complications to healthy, granular tissue.  I want him to continue with daily dressing changes with Silvadene.  Also continue and finish his antibiotics.  Offloading at all times. -Monitor for any clinical signs or symptoms of infection and directed to call the office immediately should any occur or go to the ER. -Follow-up as scheduled or sooner if needed. -Patient encouraged to call the office with any  questions, concerns, change in symptoms.   Trula Slade DPM

## 2017-10-30 ENCOUNTER — Encounter: Payer: Self-pay | Admitting: Podiatry

## 2017-10-30 ENCOUNTER — Ambulatory Visit (INDEPENDENT_AMBULATORY_CARE_PROVIDER_SITE_OTHER): Payer: Medicaid Other | Admitting: Podiatry

## 2017-10-30 DIAGNOSIS — E1149 Type 2 diabetes mellitus with other diabetic neurological complication: Secondary | ICD-10-CM

## 2017-10-30 DIAGNOSIS — L97519 Non-pressure chronic ulcer of other part of right foot with unspecified severity: Secondary | ICD-10-CM

## 2017-11-02 NOTE — Progress Notes (Signed)
Subjective: Jakyren presents the office today for evaluation of a wound of the right foot, pointing submetatarsal 5.  He states he has been continue with daily dressing changes with Silvadene.  He has been trying to wear the surgical shoe as much as possible.  He denies any increase in swelling or redness.  Denies any drainage or pus.  No other concerns today. Denies any systemic complaints such as fevers, chills, nausea, vomiting. No acute changes since last appointment, and no other complaints at this time.   Objective: AAO x3, NAD DP/PT pulses palpable bilaterally, CRT less than 3 seconds Ulceration right foot some metatarsal 5 is granular measures about the same size of 1 x 1 cm.  There is no probing, undermining or tunneling.  There is no surrounding erythema, ascending cellulitis.  There is no fluctuation or crepitation.  There is no malodor. The right second toe there is a darkened in color.  There is some callus formation the plantar aspect of the toe and upon debridement of some of this there is normal color skin underneath this. No open lesions or pre-ulcerative lesions.  No pain with calf compression, swelling, warmth, erythema  Assessment: Right submetatarsal 5 ulceration right second toe discoloration  Plan: -All treatment options discussed with the patient including all alternatives, risks, complications.  -I did sharply debride the right submetatarsal 5 ulceration without any comp occasions or bleeding.  Continue with Silvadene dressing changes daily.  Continue offloading.  Continue to monitor for any signs or symptoms of infection to call the office immediately should any occur. -We will continue to monitor the right second toe closely.  It is somewhat more dark in color today however there is no ulceration identified to the toe.  There is some hyperkeratotic tissue in the plantar aspect the toe and upon debridement there was no new healthy skin underneath. -Patient encouraged to call  the office with any questions, concerns, change in symptoms.   Trula Slade DPM

## 2017-11-07 ENCOUNTER — Ambulatory Visit (INDEPENDENT_AMBULATORY_CARE_PROVIDER_SITE_OTHER): Payer: Medicaid Other | Admitting: Podiatry

## 2017-11-07 DIAGNOSIS — L97519 Non-pressure chronic ulcer of other part of right foot with unspecified severity: Secondary | ICD-10-CM

## 2017-11-07 DIAGNOSIS — E1149 Type 2 diabetes mellitus with other diabetic neurological complication: Secondary | ICD-10-CM

## 2017-11-09 NOTE — Progress Notes (Signed)
Subjective: Tsutomu presents the office today for evaluation of a wound of the right foot, pointing submetatarsal 5.  He feels that the wound is getting better.  He has been continue with Silvadene but he is also been putting Betadine as well.  He denies any drainage or pus and denies any increase in redness or swelling to his foot.  He has no other concerns today in no acute changes since I last saw him.  Denies any systemic complaints such as fevers, chills, nausea, vomiting. No acute changes since last appointment, and no other complaints at this time.   Objective: AAO x3, NAD DP/PT pulses palpable bilaterally, CRT less than 3 seconds Ulceration right foot some metatarsal 5 is granular measuring approximately 0.8 x 0.8 cm and is superficial there is no probing, undermining or tunneling.  There is no surrounding erythema, ascending cellulitis.  There is no fluctuation or crepitation.  There is no malodor.  No clinical signs of infection is noted today.  The right second toe does appear to have a darkened discoloration.  The toe was done this before and is come back normal color.  There is currently no wound to the toe and the toe is a equal temperature compared to the other digits.  No swelling.  No open lesions or pre-ulcerative lesions.  No pain with calf compression, swelling, warmth, erythema  Assessment: Right submetatarsal 5 ulceration right second toe discoloration  Plan: -All treatment options discussed with the patient including all alternatives, risks, complications.  -After verbal consent was obtained I sharply debrided the wound on the right foot to healthy, granular tissue with a #312 blade scalpel without any complications.  Him to continue with a small amount of Silvadene but do not apply Silvadene and Betadine together.  If he starts to get too moist he can switch to Betadine. -Continue to monitor the right second toe.  No wounds or signs of infection noted. -Follow-up in 2 weeks or  sooner if needed.  Trula Slade DPM

## 2017-11-21 ENCOUNTER — Encounter: Payer: Self-pay | Admitting: Podiatry

## 2017-11-21 ENCOUNTER — Ambulatory Visit (INDEPENDENT_AMBULATORY_CARE_PROVIDER_SITE_OTHER): Payer: Medicaid Other | Admitting: Podiatry

## 2017-11-21 VITALS — BP 115/83 | HR 116 | Resp 16

## 2017-11-21 DIAGNOSIS — L97519 Non-pressure chronic ulcer of other part of right foot with unspecified severity: Secondary | ICD-10-CM

## 2017-11-23 NOTE — Progress Notes (Signed)
Subjective: Edward [name] presents the office today for evaluation of a wound of the right foot, pointing submetatarsal 5.  He states he is doing better.  Has been putting Silvadene on the wound daily.  He denies any drainage or pus and he feels that the area is getting smaller.  He denies any increase in swelling or any redness of his foot.  He states the right second toe is doing about the same as far as the color is not worsened. Denies any systemic complaints such as fevers, chills, nausea, vomiting. No acute changes since last appointment, and no other complaints at this time.   Objective: AAO x3, NAD DP/PT pulses palpable bilaterally, CRT less than 3 seconds Ulceration right foot some metatarsal 5 is granular measuring approximately 0.4 x 0.4 cm and is superficial there is no probing, undermining or tunneling.  Overall the wound appears to be smaller today.  There is no surrounding erythema, ascending cellulitis.  There is no fluctuation or crepitation.  There is no malodor.  No clinical signs of infection is noted today.  The right second toe does appear to have a darkened discoloration.  This is about the same.  There is currently no wound to the toe and the toe is a equal temperature compared to the other digits.  No swelling.  No open lesions or pre-ulcerative lesions.  No pain with calf compression, swelling, warmth, erythema  Assessment: Right submetatarsal 5 ulceration right second toe discoloration  Plan: -All treatment options discussed with the patient including all alternatives, risks, complications.  -After verbal consent was obtained I sharply debrided the wound on the right foot to healthy, granular tissue with a #312 blade scalpel without any complications.  Continue with Silvadene dressing changes daily.  Continue offering all times. -Continue to monitor the right second toe.  No wounds or signs of infection noted. -Monitor for any clinical signs or symptoms of infection and directed  to call the office immediately should any occur or go to the ER. -Follow-up in 2 weeks or sooner if needed.  Trula Slade DPM

## 2017-12-12 ENCOUNTER — Ambulatory Visit: Payer: Medicaid Other | Admitting: Podiatry

## 2018-01-06 ENCOUNTER — Other Ambulatory Visit (INDEPENDENT_AMBULATORY_CARE_PROVIDER_SITE_OTHER): Payer: Self-pay

## 2018-01-06 ENCOUNTER — Ambulatory Visit (INDEPENDENT_AMBULATORY_CARE_PROVIDER_SITE_OTHER): Payer: Medicaid Other | Admitting: Orthopaedic Surgery

## 2018-01-06 ENCOUNTER — Encounter (INDEPENDENT_AMBULATORY_CARE_PROVIDER_SITE_OTHER): Payer: Self-pay | Admitting: Orthopaedic Surgery

## 2018-01-06 DIAGNOSIS — M79605 Pain in left leg: Principal | ICD-10-CM

## 2018-01-06 DIAGNOSIS — R29898 Other symptoms and signs involving the musculoskeletal system: Secondary | ICD-10-CM

## 2018-01-06 DIAGNOSIS — M79604 Pain in right leg: Secondary | ICD-10-CM

## 2018-01-06 NOTE — Progress Notes (Signed)
Patient is someone who comes in with chief complaint of bilateral leg weakness and feeling like his balance and coordination are off.  He is someone who has had a left total hip arthroplasty but also a left foot transmetatarsal amputation and issues with his right foot in terms of his diabetes and surgeries as well.  He feels like his legs are weak and he has a lot of tightness in his legs from his thighs and his knees down and is hard for him to squat or bend down and sometimes hard to get out of a chair.  He is only 47 years old.  He is significantly deconditioned.  On examination both his legs he has his generalized weakness of his thighs including his quads and hamstrings as well as tight hamstrings.  He has weakness in both lower extremities in terms of his calf and gastrocs.  He would definitely benefit from outpatient physical therapy for generalized conditioning and strengthening of his bilateral extremities as well as working on balance, gait training and coordination.  He agrees with trying outpatient physical therapy as well.  We will work on getting this established with a consistent and see him back in 6 weeks to see if this is helped.

## 2018-01-08 ENCOUNTER — Encounter: Payer: Self-pay | Admitting: Podiatry

## 2018-01-08 ENCOUNTER — Ambulatory Visit (INDEPENDENT_AMBULATORY_CARE_PROVIDER_SITE_OTHER): Payer: Self-pay | Admitting: Podiatry

## 2018-01-08 DIAGNOSIS — L97519 Non-pressure chronic ulcer of other part of right foot with unspecified severity: Secondary | ICD-10-CM

## 2018-01-13 NOTE — Progress Notes (Signed)
Subjective: 47 year old male presents the office today saying that he needs a callus/wound trimmed on his right foot.  Denies any drainage or pus or any swelling or redness.  He said no recent changes since I last saw him and no other concerns. Denies any systemic complaints such as fevers, chills, nausea, vomiting. No acute changes since last appointment, and no other complaints at this time.   Objective: AAO x3, NAD-presents today wearing flip-flops DP/PT pulses palpable bilaterally, CRT less than 3 seconds Protective sensation decreased with Simms Weinstein monofilament Hyperkeratotic lesion right submetatarsal 5.  Upon debridement superficial wound present measuring approximately 1.5 x 1.5 cm and is larger than last appointment however it is more macerated tissue along area.  There is no drainage or pus expressed and there is no surrounding erythema, ascending cellulitis.  There is no fluctuation or crepitation or any malodor.  No other open lesion identified. Chronic discoloration of the right second toe however this appears to be improving No open lesions or pre-ulcerative lesions.  No pain with calf compression, swelling, warmth, erythema  Assessment: Ulceration right foot  Plan: -All treatment options discussed with the patient including all alternatives, risks, complications.  -Sharply debrided the area to the right foot without any complications of bleeding to reveal underlying wound.  The wound was sharply debrided to healthy, granular tissue.  Given the macerated tissue and use Betadine to the area.  Also discussed the importance of offloading.  We can discuss surgical intervention to remove the metatarsal head to help take pressure off the area but due to insurance reasons need to hold off on this. -Monitor for any clinical signs or symptoms of infection and directed to call the office immediately should any occur or go to the ER. -RTC 2 weeks or sooner if needed -Patient encouraged  to call the office with any questions, concerns, change in symptoms.   Trula Slade DPM

## 2018-01-20 ENCOUNTER — Other Ambulatory Visit: Payer: Self-pay

## 2018-01-20 ENCOUNTER — Ambulatory Visit: Payer: Self-pay | Attending: Orthopaedic Surgery | Admitting: Physical Therapy

## 2018-01-20 ENCOUNTER — Encounter: Payer: Self-pay | Admitting: Physical Therapy

## 2018-01-20 DIAGNOSIS — M6281 Muscle weakness (generalized): Secondary | ICD-10-CM | POA: Insufficient documentation

## 2018-01-20 DIAGNOSIS — R2689 Other abnormalities of gait and mobility: Secondary | ICD-10-CM | POA: Insufficient documentation

## 2018-01-20 DIAGNOSIS — R2681 Unsteadiness on feet: Secondary | ICD-10-CM | POA: Insufficient documentation

## 2018-01-20 NOTE — Therapy (Signed)
[place], [place], [address] Phone: [phone]   Fax:  [phone]  Physical Therapy Evaluation  Patient Details  Name: Edward [name] MRN: [id number] Date of Birth: July 19, 1971 Referring Provider: Jean Rosenthal   Encounter Date: 01/20/2018  PT End of Session - 01/20/18 1113    Visit Number  1    Number of Visits  13    Date for PT Re-Evaluation  03/03/18    Authorization Type  self-pay     PT Start Time  1109 pt arrived 8 min late    PT Stop Time  1149    PT Time Calculation (min)  40 min    Activity Tolerance  Patient tolerated treatment well    Behavior During Therapy  [facility] for tasks assessed/performed       Past Medical History:  Diagnosis Date  . Anxiety    self reported  . Arthritis   . Depression    self reported  . Diabetes mellitus   . Hypertension     Past Surgical History:  Procedure Laterality Date  . AMPUTATION Left 05/10/2016   Procedure: Left Transmetatarsal Amputation;  Surgeon: [name], MD;  Location: [place];  Service: Orthopedics;  Laterality: Left;  . CIRCUMCISION  09/02/2011   Procedure: CIRCUMCISION ADULT;  Surgeon: Molli Hazard, MD;  Location: [place];  Service: Urology;  Laterality: N/A;  . I&D EXTREMITY  09/02/2011   Procedure: IRRIGATION AND DEBRIDEMENT EXTREMITY;  Surgeon: Theodoro Kos, DO;  Location: [place];  Service: Plastics;  Laterality: N/A;  . INCISION AND DRAINAGE OF WOUND  08/12/2011   Procedure: IRRIGATION AND DEBRIDEMENT WOUND;  Surgeon: Zenovia Jarred, MD;  Location: [place];  Service: General;;  perineum  . IRRIGATION AND DEBRIDEMENT ABSCESS  08/12/2011   Procedure: IRRIGATION AND DEBRIDEMENT ABSCESS;  Surgeon: Molli Hazard, MD;  Location: [place];  Service: Urology;  Laterality: N/A;  irrigation and debridment perineum    . IRRIGATION AND DEBRIDEMENT ABSCESS  08/14/2011   Procedure: MINOR  INCISION AND DRAINAGE OF ABSCESS;  Surgeon: Molli Hazard, MD;  Location: [place];  Service: Urology;  Laterality: N/A;  Perineal Wound Debridement;Placement Bilateral Testicular Thigh Pouches  . TOTAL HIP ARTHROPLASTY Left 10/11/2016   Procedure: LEFT TOTAL HIP ARTHROPLASTY ANTERIOR APPROACH;  Surgeon: Mcarthur Rossetti, MD;  Location: [facility];  Service: Orthopedics;  Laterality: Left;    There were no vitals filed for this visit.   Subjective Assessment - 01/20/18 1125    Subjective  pt is a 47 y.o M with CC of difficulty of problems with balance and reports incresaed tightness noted in bil LE with increased activity which is tough to pin point where. started after L THA in 2018. rpeorts more tightness than pain. reports a hx of about 5-6 falls inthe last 6 months which he attributes to his to the L foot toes being amputated.     Pertinent History  L foot all toes have been amputated. hx or L THA    Limitations  Standing;Walking;House hold activities    How long can you sit comfortably?  unlimited    How long can you stand comfortably?  10 -15 min     How long can you walk comfortably?  10 -15 min     Diagnostic tests  nothing recently    Patient Stated Goals  help with abilities, get walking longer, get back to work.  Currently in Pain?  Yes    Pain Score  0-No pain    Pain Location  Leg    Pain Orientation  Right;Left    Pain Descriptors / Indicators  Tightness stiffness    Pain Type  Chronic pain    Pain Onset  More than a month ago    Pain Frequency  Intermittent    Aggravating Factors   walking/ standing for certain period of time,     Pain Relieving Factors  sitting, resting    Effect of Pain on Daily Activities  limited endurance         [facility] PT Assessment - 01/20/18 1115      Assessment   Medical Diagnosis  bil LE weakness    Referring Provider  Jean Rosenthal    Onset Date/Surgical Date  -- 2018    Hand Dominance  Right    Next MD Visit  -- July  2019    Prior Therapy  no      Precautions   Precautions  None      Restrictions   Weight Bearing Restrictions  No      Balance Screen   Has the patient fallen in the past 6 months  Yes    How many times?  6    Has the patient had a decrease in activity level because of a fear of falling?   Yes    Is the patient reluctant to leave their home because of a fear of falling?   Yes      [place]  Private residence    Living Arrangements  Children    Available Help at Discharge  Family;Available PRN/intermittently    Type of Home  House    Home Access  Stairs to enter    Entrance Stairs-Number of Steps  2    Entrance Stairs-Rails  None    Home Layout  One level    [place] - 2 wheels;Shower seat;Cane - single point      Prior Function   Level of Independence  Independent with basic ADLs;Requires assistive device for independence;Needs assistance with homemaking    Meal Prep  Moderate    Laundry  Moderate    Vacuuming  Moderate    Vocation  On disability      Cognition   Overall Cognitive Status  Within Functional Limits for tasks assessed      Observation/Other Assessments   Observations  ulcer along the plantar aspect of R foot     Focus on Therapeutic Outcomes (FOTO)   74% limited 53% limited      Posture/Postural Control   Posture/Postural Control  Postural limitations    Postural Limitations  Rounded Shoulders;Forward head      ROM / Strength   AROM / PROM / Strength  AROM;PROM;Strength      AROM   AROM Assessment Site  Ankle    Right/Left Ankle  Right;Left    Right Ankle Dorsiflexion  0 from neutral    Left Ankle Dorsiflexion  3      PROM   PROM Assessment Site  Ankle    Right Ankle Dorsiflexion  8    Left Ankle Dorsiflexion  8      Strength   Strength Assessment Site  Hip;Knee;Ankle    Right/Left Hip  Right;Left    Right Hip Flexion  3+/5    Right Hip Extension  4-/5    Right Hip ABduction  4-/5    Right Hip  ADduction  4-/5    Left Hip Flexion  4-/5    Left Hip Extension  4-/5    Left Hip ABduction  4-/5    Left Hip ADduction  4-/5    Right/Left Knee  Right;Left    Right Knee Flexion  4/5    Right Knee Extension  4/5    Left Knee Flexion  4-/5    Left Knee Extension  4/5    Right/Left Ankle  Right;Left    Right Ankle Dorsiflexion  3+/5    Right Ankle Plantar Flexion  3+/5    Right Ankle Inversion  3+/5    Right Ankle Eversion  3+/5    Left Ankle Dorsiflexion  3+/5    Left Ankle Plantar Flexion  3+/5    Left Ankle Inversion  3+/5    Left Ankle Eversion  3+/5      Palpation   Palpation comment  TTP noted along bil calfs      Ambulation/Gait   Ambulation/Gait  Yes    Assistive device  Straight cane    Gait Pattern  Step-to pattern;Decreased stride length;Trendelenburg;Antalgic offloading shoe on R foot                Objective measurements completed on examination: See above findings.      [place] Adult PT Treatment/Exercise - 01/20/18 1115      Knee/Hip Exercises: Seated   Sit to Sand  1 set;5 reps cues/ demonstration for proper form "noes over toes"             PT Education - 01/20/18 1204    Education Details  evaluation findings, POC, goals, HEP with proper form/ rationale, outcome measure assessment. Gait mechanics and benefits of ROM to prevent falls.     Person(s) Educated  Patient    Methods  Explanation;Verbal cues;Handout    Comprehension  Verbalized understanding;Verbal cues required       PT Short Term Goals - 01/20/18 1215      PT SHORT TERM GOAL #1   Title  pt to be I with inital HEP     Baseline  no previous HEP    Time  3    Period  Weeks    Status  New    Target Date  02/10/18      PT SHORT TERM GOAL #2   Title  assess berg balance and LTG appropriately     Time  2    Period  Weeks    Status  New    Target Date  02/03/18        PT Long Term Goals - 01/20/18 1219      PT LONG TERM GOAL #1   Title  pt increase bil ankle DF  to >/= 6 degrees to promote functoinal and efficient gait pattern to reduce catching foot on floor and reduce falls    Baseline  R ankle 0 and L ankle 3    Time  6    Period  Weeks    Status  New    Target Date  03/03/18      PT LONG TERM GOAL #2   Title  improve bil LE strength to >/= 4/5 to promote safety with standing/ walking activities and maximixe safety    Baseline  ankle 3+/5 in all planes bil, bil hips 4-/5 except for R hip flexoin 3+/5, bil knees 4/5 except for L knee flexoin 4-/5  Time  6    Period  Weeks    Status  New    Target Date  03/03/18      PT LONG TERM GOAL #3   Title  pt to be able to walk/ stand >/= 45 min with LRAD reporting no stiffness/ tightness for community distances    Baseline  walk/ standing 10-15 min    Time  6    Period  Weeks    Status  New    Target Date  03/03/18      PT LONG TERM GOAL #4   Title  improve FOTO to </= 53% limited to demo improvement in function     Baseline  FOTO 74% limited    Time  6    Period  Weeks    Status  New    Target Date  03/03/18      PT LONG TERM GOAL #5   Title  pt to be I with all HEP given as of last visit maintain and progress current level of function    Baseline  no previous HEP    Time  6    Period  Weeks    Status  New    Target Date  03/03/18             Plan - 01/20/18 1206    Clinical Impression Statement  pt presents to OPPT with CC of balance and tightness/ weakness in bil LE but was unable to note specifically where. general weakness noted in bil LE, significantly in bil ankles, with limited mobility noted specifically with DF secondary to tight calf musculature. unable to assess balance today due to pt running late. He would benefit from physical therapy to improve strength, increase balance/ safety, maximize function and improve QOL by addressing the deficits listed.     History and Personal Factors relevant to plan of care:  hx uncontrolled DM, and mulitple falls    Clinical  Presentation  Evolving    Clinical Presentation due to:  hx of L foot all toes ampuated, recent ulcer on R foot. limited strength in bil LE, abnormal gait    Clinical Decision Making  Moderate    Rehab Potential  Good    PT Frequency  2x / week    PT Duration  6 weeks    PT Treatment/Interventions  ADLs/Self Care Home Management;Cryotherapy;Electrical Stimulation;Ultrasound;Moist Heat;Gait training;Stair training;Functional mobility training;Therapeutic activities;Therapeutic exercise;Manual techniques;Taping;Patient/family education;Passive range of motion;Balance training;Dry needling    PT Next Visit Plan  review/ update HEP PRN, assess BERG balance, calf stretching/ soft tissue work. hip/ knee/ ankle strengthening, with modalities PRN    PT Home Exercise Plan  seated calf stretching, 4 way ankle stretch, sit to stand, hamstring stretch, clamshells.     Consulted and Agree with Plan of Care  Patient       Patient will benefit from skilled therapeutic intervention in order to improve the following deficits and impairments:  Abnormal gait, Decreased activity tolerance, Decreased endurance, Improper body mechanics, Postural dysfunction, Decreased safety awareness, Decreased strength, Decreased range of motion, Increased fascial restricitons  Visit Diagnosis: Muscle weakness (generalized) - Plan: PT plan of care cert/re-cert  Other abnormalities of gait and mobility - Plan: PT plan of care cert/re-cert  Unsteadiness on feet - Plan: PT plan of care cert/re-cert     Problem List Patient Active Problem List   Diagnosis Date Noted  . Viral pharyngitis 01/14/2017  . Cigarette smoker 01/14/2017  . Elevated blood pressure  reading 01/14/2017  . Avascular necrosis of hip, left ([place]) 10/11/2016  . Status post left hip replacement 10/11/2016  . Unilateral primary osteoarthritis, left hip 09/18/2016  . S/P transmetatarsal amputation of foot, left ([place]) 07/25/2016  . Balance problem 07/25/2016   . Sepsis ([place]) 05/07/2016  . ARF (acute renal failure) ([place]) 05/07/2016  . Fournier's gangrene 08/16/2011  . Acute respiratory failure ([place]) 08/16/2011  . Septic shock(785.52) 08/16/2011  . Diabetic osteomyelitis ([place]) 08/16/2011   [place] PT, DPT, LAT, ATC  01/20/18  12:35 PM      [facility] [address] [place], [place], [address] Phone: [phone]   Fax:  [phone]  Name: KHAALID LEFKOWITZ MRN: [id number] Date of Birth: 1970-11-02

## 2018-01-22 ENCOUNTER — Ambulatory Visit (INDEPENDENT_AMBULATORY_CARE_PROVIDER_SITE_OTHER): Payer: Medicaid Other | Admitting: Podiatry

## 2018-01-22 DIAGNOSIS — L97519 Non-pressure chronic ulcer of other part of right foot with unspecified severity: Secondary | ICD-10-CM

## 2018-01-22 DIAGNOSIS — E1149 Type 2 diabetes mellitus with other diabetic neurological complication: Secondary | ICD-10-CM

## 2018-01-25 NOTE — Progress Notes (Signed)
Subjective: 47 year old male presents the office today saying that he needs a callus/wound trimmed on his right foot.  He states that he is doing better.  He has not had any drainage or pus coming from the area denies any swelling or redness as well.  He has no other concerns today. Denies any systemic complaints such as fevers, chills, nausea, vomiting. No acute changes since last appointment, and no other complaints at this time.   Objective: AAO x3, NAD-presents today wearing flip-flops DP/PT pulses palpable bilaterally, CRT less than 3 seconds Protective sensation decreased with Simms Weinstein monofilament Right foot submetatarsal 5 is a hyperkeratotic lesion.  Upon debridement appears to be healing wound is healed.  There is no edema, erythema, drainage or pus there is no ascending sialitis.  Is no other open lesions or pre-ulcerative lesion identified today. No pain with calf compression, swelling, warmth, erythema  Assessment: Ulceration right foot which appears to be healed  Plan: -All treatment options discussed with the patient including all alternatives, risks, complications.  -I sharply debrided the hyperkeratotic tissue to the right foot submetatarsal 5 2 without any complications or bleeding to reveal the underlying wound is healed.  Discussed moisturizer to the area daily and watch for any recurrence.  Continue supportive shoes.  Daily foot inspection discussed.  Trula Slade DPM

## 2018-01-27 ENCOUNTER — Ambulatory Visit: Payer: Self-pay | Admitting: Physical Therapy

## 2018-01-31 ENCOUNTER — Emergency Department (HOSPITAL_COMMUNITY): Payer: No Typology Code available for payment source

## 2018-01-31 ENCOUNTER — Encounter (HOSPITAL_COMMUNITY): Payer: Self-pay | Admitting: *Deleted

## 2018-01-31 ENCOUNTER — Emergency Department (HOSPITAL_COMMUNITY)
Admission: EM | Admit: 2018-01-31 | Discharge: 2018-01-31 | Disposition: A | Discharge status: Home / Self Care | Payer: No Typology Code available for payment source | Attending: Emergency Medicine | Admitting: Emergency Medicine

## 2018-01-31 DIAGNOSIS — Z96642 Presence of left artificial hip joint: Secondary | ICD-10-CM | POA: Insufficient documentation

## 2018-01-31 DIAGNOSIS — Z041 Encounter for examination and observation following transport accident: Secondary | ICD-10-CM | POA: Diagnosis present

## 2018-01-31 DIAGNOSIS — I10 Essential (primary) hypertension: Secondary | ICD-10-CM | POA: Diagnosis not present

## 2018-01-31 DIAGNOSIS — M25552 Pain in left hip: Secondary | ICD-10-CM | POA: Diagnosis not present

## 2018-01-31 DIAGNOSIS — Z79899 Other long term (current) drug therapy: Secondary | ICD-10-CM | POA: Insufficient documentation

## 2018-01-31 DIAGNOSIS — M545 Low back pain: Secondary | ICD-10-CM | POA: Diagnosis not present

## 2018-01-31 DIAGNOSIS — Y999 Unspecified external cause status: Secondary | ICD-10-CM | POA: Insufficient documentation

## 2018-01-31 DIAGNOSIS — Y939 Activity, unspecified: Secondary | ICD-10-CM | POA: Insufficient documentation

## 2018-01-31 DIAGNOSIS — Z794 Long term (current) use of insulin: Secondary | ICD-10-CM | POA: Diagnosis not present

## 2018-01-31 DIAGNOSIS — F17210 Nicotine dependence, cigarettes, uncomplicated: Secondary | ICD-10-CM | POA: Insufficient documentation

## 2018-01-31 DIAGNOSIS — Z7982 Long term (current) use of aspirin: Secondary | ICD-10-CM | POA: Insufficient documentation

## 2018-01-31 DIAGNOSIS — M7918 Myalgia, other site: Secondary | ICD-10-CM

## 2018-01-31 DIAGNOSIS — E119 Type 2 diabetes mellitus without complications: Secondary | ICD-10-CM | POA: Diagnosis not present

## 2018-01-31 DIAGNOSIS — Y929 Unspecified place or not applicable: Secondary | ICD-10-CM | POA: Diagnosis not present

## 2018-01-31 NOTE — Discharge Instructions (Addendum)
See your Physician for recheck if pain persist ?

## 2018-01-31 NOTE — ED Notes (Signed)
Pt is still ambulatory per norm stated it is "just starting to become uncomfortable."
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
## 2018-02-02 ENCOUNTER — Ambulatory Visit: Payer: Self-pay | Admitting: Physical Therapy

## 2018-02-02 DIAGNOSIS — R2689 Other abnormalities of gait and mobility: Secondary | ICD-10-CM

## 2018-02-02 DIAGNOSIS — R2681 Unsteadiness on feet: Secondary | ICD-10-CM

## 2018-02-02 DIAGNOSIS — M6281 Muscle weakness (generalized): Secondary | ICD-10-CM
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
## 2018-02-05 ENCOUNTER — Ambulatory Visit: Payer: Self-pay | Admitting: Physical Therapy

## 2018-02-08 ENCOUNTER — Encounter (HOSPITAL_COMMUNITY): Payer: Self-pay | Admitting: Family Medicine

## 2018-02-08 ENCOUNTER — Ambulatory Visit (HOSPITAL_COMMUNITY)
Admission: EM | Admit: 2018-02-08 | Discharge: 2018-02-08 | Disposition: A | Discharge status: Home / Self Care | Payer: Medicaid Other | Attending: Family Medicine | Admitting: Family Medicine

## 2018-02-08 ENCOUNTER — Other Ambulatory Visit: Payer: Self-pay

## 2018-02-08 DIAGNOSIS — M545 Low back pain, unspecified: Secondary | ICD-10-CM

## 2018-02-08 DIAGNOSIS — R202 Paresthesia of skin: Secondary | ICD-10-CM

## 2018-02-08 MED ORDER — TRAMADOL HCL 50 MG PO TABS: 50.0000 mg | ORAL_TABLET | Freq: Four times a day (QID) | ORAL | 0 refills | Status: DC | PRN
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
## 2018-02-10 ENCOUNTER — Ambulatory Visit: Payer: Self-pay | Admitting: Physical Therapy

## 2018-02-13 ENCOUNTER — Ambulatory Visit (INDEPENDENT_AMBULATORY_CARE_PROVIDER_SITE_OTHER): Payer: Self-pay | Admitting: Podiatry

## 2018-02-13 ENCOUNTER — Encounter: Payer: Self-pay | Admitting: Podiatry

## 2018-02-13 ENCOUNTER — Ambulatory Visit: Payer: Self-pay | Admitting: Physical Therapy

## 2018-02-13 DIAGNOSIS — R2681 Unsteadiness on feet: Secondary | ICD-10-CM

## 2018-02-13 DIAGNOSIS — L84 Corns and callosities: Secondary | ICD-10-CM

## 2018-02-13 DIAGNOSIS — E1149 Type 2 diabetes mellitus with other diabetic neurological complication: Secondary | ICD-10-CM

## 2018-02-13 DIAGNOSIS — M6281 Muscle weakness (generalized): Secondary | ICD-10-CM

## 2018-02-13 DIAGNOSIS — R2689 Other abnormalities of gait and mobility: Secondary | ICD-10-CM

## 2018-02-13 NOTE — Therapy (Signed)
[place], [place], [address] Phone: [phone]   Fax:  [phone]  Physical Therapy Treatment  Patient Details  Name: Edward [name] MRN: [id number] Date of Birth: Jun 17, 1971 Referring Provider: Jean Rosenthal   Encounter Date: 02/13/2018  PT End of Session - 02/13/18 1027    Visit Number  3    Number of Visits  13    Date for PT Re-Evaluation  03/03/18    Authorization Type  self-pay     PT Start Time  0848    PT Stop Time  0930    PT Time Calculation (min)  42 min    Activity Tolerance  Patient limited by pain    Behavior During Therapy  [facility] for tasks assessed/performed       Past Medical History:  Diagnosis Date  . Anxiety    self reported  . Arthritis   . Depression    self reported  . Diabetes mellitus   . Hypertension     Past Surgical History:  Procedure Laterality Date  . AMPUTATION Left 05/10/2016   Procedure: Left Transmetatarsal Amputation;  Surgeon: [name], MD;  Location: [place];  Service: Orthopedics;  Laterality: Left;  . CIRCUMCISION  09/02/2011   Procedure: CIRCUMCISION ADULT;  Surgeon: Molli Hazard, MD;  Location: [place];  Service: Urology;  Laterality: N/A;  . I&D EXTREMITY  09/02/2011   Procedure: IRRIGATION AND DEBRIDEMENT EXTREMITY;  Surgeon: Theodoro Kos, DO;  Location: [place];  Service: Plastics;  Laterality: N/A;  . INCISION AND DRAINAGE OF WOUND  08/12/2011   Procedure: IRRIGATION AND DEBRIDEMENT WOUND;  Surgeon: Zenovia Jarred, MD;  Location: [place];  Service: General;;  perineum  . IRRIGATION AND DEBRIDEMENT ABSCESS  08/12/2011   Procedure: IRRIGATION AND DEBRIDEMENT ABSCESS;  Surgeon: Molli Hazard, MD;  Location: [place];  Service: Urology;  Laterality: N/A;  irrigation and debridment perineum    . IRRIGATION AND DEBRIDEMENT ABSCESS  08/14/2011   Procedure: MINOR INCISION AND DRAINAGE OF ABSCESS;   Surgeon: Molli Hazard, MD;  Location: [place];  Service: Urology;  Laterality: N/A;  Perineal Wound Debridement;Placement Bilateral Testicular Thigh Pouches  . TOTAL HIP ARTHROPLASTY Left 10/11/2016   Procedure: LEFT TOTAL HIP ARTHROPLASTY ANTERIOR APPROACH;  Surgeon: Mcarthur Rossetti, MD;  Location: [facility];  Service: Orthopedics;  Laterality: Left;    There were no vitals filed for this visit.  Subjective Assessment - 02/13/18 0848    Subjective  "I feel okay if i can get over this numbness and tingling in my legs."    Currently in Pain?  No/denies    Pain Score  0-No pain                       [facility] Adult PT Treatment/Exercise - 02/13/18 0001      Lumbar Exercises: Stretches   Active Hamstring Stretch  1 rep;30 seconds;Right;Left PNF hold relax technique    Gastroc Stretch  Right;Left;2 reps;30 seconds slant board      Knee/Hip Exercises: Aerobic   Nustep  L3 x 5 min LE only      Knee/Hip Exercises: Seated   Long Arc Quad  Both;AROM;Strengthening;10 reps;2 sets      Knee/Hip Exercises: Supine   Heel Slides  -- demonstration for correct technique    Bridges  2 sets;AROM;Strengthening;Both;10 reps demonstration required for correct technique    Other Supine Knee/Hip Exercises  clamshells red theraband 2 x 10          Balance Exercises - 02/13/18 1033      Balance Exercises: Standing   Standing Eyes Closed  2 reps;30 secs    Tandem Stance  Eyes open;Eyes closed;2 reps;25 secs;30 secs 1/2 tandem        PT Education - 02/13/18 1027    Education Details  encouragement and importance of performing HEP    Person(s) Educated  Patient    Methods  Explanation    Comprehension  Verbalized understanding       PT Short Term Goals - 01/20/18 1215      PT SHORT TERM GOAL #1   Title  pt to be I with inital HEP     Baseline  no previous HEP    Time  3    Period  Weeks    Status  New    Target Date  02/10/18      PT SHORT TERM GOAL #2   Title   assess berg balance and LTG appropriately     Time  2    Period  Weeks    Status  New    Target Date  02/03/18        PT Long Term Goals - 02/02/18 1445      PT LONG TERM GOAL #1   Title  pt increase bil ankle DF to >/= 6 degrees to promote functoinal and efficient gait pattern to reduce catching foot on floor and reduce falls    Baseline  R ankle 0 and L ankle 3    Time  6    Period  Weeks    Status  New      PT LONG TERM GOAL #2   Title  improve bil LE strength to >/= 4/5 to promote safety with standing/ walking activities and maximixe safety    Baseline  ankle 3+/5 in all planes bil, bil hips 4-/5 except for R hip flexoin 3+/5, bil knees 4/5 except for L knee flexoin 4-/5    Time  6    Period  Weeks    Status  New      PT LONG TERM GOAL #3   Title  pt to be able to walk/ stand >/= 45 min with LRAD reporting no stiffness/ tightness for community distances    Baseline  walk/ standing 10-15 min    Time  6    Period  Weeks    Status  New      PT LONG TERM GOAL #4   Title  improve FOTO to </= 53% limited to demo improvement in function     Baseline  FOTO 74% limited    Time  6    Period  Weeks    Status  New      PT LONG TERM GOAL #5   Title  pt to be I with all HEP given as of last visit maintain and progress current level of function    Baseline  no previous HEP    Time  6    Period  Weeks    Status  New      PT LONG TERM GOAL #6   Title  Pt will increase BERG balance score to >/= 45/56 to decrease fall risk and improve standing balance with LRAD    Baseline  34/56    Time  6    Period  Weeks    Status  New  Target Date  03/03/18            Plan - 02/13/18 1027    Clinical Impression Statement  Pt reports some numbness and tingling in BLE, which is worse when he gets out of bed in the morning. He denies saddle paresthesia or any incontinence. Treatment today focused on strengthening of hip and knee musculature. Also focused on balance with eyes closed  with feet together and half tandem stance with eyes open and eyes closed. Increased sway noted with eyes closed in half tandem stance. Pt reports he has been forgetting small things and wonders if he may have a concussion. Instructed pt to avoid looking at screens for long periods of time and anything that may require deep concentration. Encouraged to seek help if symptoms persist or worsen.     PT Treatment/Interventions  ADLs/Self Care Home Management;Cryotherapy;Electrical Stimulation;Ultrasound;Moist Heat;Gait training;Stair training;Functional mobility training;Therapeutic activities;Therapeutic exercise;Manual techniques;Taping;Patient/family education;Passive range of motion;Balance training;Dry needling    PT Next Visit Plan  review/ update HEP PRN, calf & hamstring stretching/ soft tissue work. hip/ knee/ ankle strengthening, muscle energy technique, balance, modalities prn    PT Home Exercise Plan  seated calf stretching, 4 way ankle stretch, sit to stand, hamstring stretch, clamshells; added SLR and lower trunk rotations    Consulted and Agree with Plan of Care  Patient       Patient will benefit from skilled therapeutic intervention in order to improve the following deficits and impairments:  Abnormal gait, Decreased activity tolerance, Decreased endurance, Improper body mechanics, Postural dysfunction, Decreased safety awareness, Decreased strength, Decreased range of motion, Increased fascial restricitons  Visit Diagnosis: Muscle weakness (generalized)  Other abnormalities of gait and mobility  Unsteadiness on feet     Problem List Patient Active Problem List   Diagnosis Date Noted  . Viral pharyngitis 01/14/2017  . Cigarette smoker 01/14/2017  . Elevated blood pressure reading 01/14/2017  . Avascular necrosis of hip, left ([place]) 10/11/2016  . Status post left hip replacement 10/11/2016  . Unilateral primary osteoarthritis, left hip 09/18/2016  . S/P transmetatarsal  amputation of foot, left ([place]) 07/25/2016  . Balance problem 07/25/2016  . Sepsis ([place]) 05/07/2016  . ARF (acute renal failure) ([place]) 05/07/2016  . Fournier's gangrene 08/16/2011  . Acute respiratory failure ([place]) 08/16/2011  . Septic shock(785.52) 08/16/2011  . Diabetic osteomyelitis ([place]) 08/16/2011   Worthy Flank, SPT 02/13/18 10:34 AM   [facility] [address] [place], [place], [address] Phone: [phone]   Fax:  [phone]  Name: Edward [name] MRN: [id number] Date of Birth: [birth year]

## 2018-02-14 NOTE — Progress Notes (Signed)
Subjective: 46 year old male presents the office today saying that he needs a callus/wound trimmed on his right foot.  He states that the wound is done well he has not had any drainage or pus and the area has healed.  He is wearing a regular shoe without any issues.  He denies any increase in swelling or redness and he denies any new sores.  Denies pain.  He has no other concerns.    Objective: AAO x3, NAD-presents today wearing flip-flops DP/PT pulses palpable bilaterally, color to the toes is much improved compared to what it has been previously and there is immediate capillary refill time. Protective sensation decreased with Derrel Nip monofilament Right foot submetatarsal 5 is a hyperkeratotic lesion.  Upon debridement appears to be healing wound is healed.  There is no edema, erythema, drainage or pus there is no ascending cellulitis.  There is no other open lesions or pre-ulcerative lesion identified today. No pain with calf compression, swelling, warmth, erythema  Assessment: Ulceration right foot which appears to be healed  Plan: -All treatment options discussed with the patient including all alternatives, risks, complications.  -I sharply debrided the hyperkeratotic tissue to the right foot submetatarsal 5  without any complications or bleeding.  The wound remains healed.  Continue moisturizer to the area daily.  Monitor for any other skin breakdown or new ulcerations.  Trula Slade DPM

## 2018-02-16 ENCOUNTER — Ambulatory Visit: Payer: Medicaid Other | Admitting: Physical Therapy

## 2018-02-17 ENCOUNTER — Encounter (INDEPENDENT_AMBULATORY_CARE_PROVIDER_SITE_OTHER): Payer: Self-pay | Admitting: Orthopaedic Surgery

## 2018-02-17 ENCOUNTER — Ambulatory Visit (INDEPENDENT_AMBULATORY_CARE_PROVIDER_SITE_OTHER): Payer: Self-pay | Admitting: Orthopaedic Surgery

## 2018-02-17 DIAGNOSIS — M5440 Lumbago with sciatica, unspecified side: Secondary | ICD-10-CM

## 2018-02-17 MED ORDER — TIZANIDINE HCL 4 MG PO TABS: 4.0000 mg | ORAL_TABLET | Freq: Three times a day (TID) | ORAL | 0 refills | Status: DC | PRN
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
## 2018-02-18 ENCOUNTER — Other Ambulatory Visit (INDEPENDENT_AMBULATORY_CARE_PROVIDER_SITE_OTHER): Payer: Self-pay

## 2018-02-18 ENCOUNTER — Encounter: Payer: Self-pay | Admitting: Physical Therapy

## 2018-02-18 ENCOUNTER — Ambulatory Visit: Payer: Medicaid Other | Attending: Orthopaedic Surgery | Admitting: Physical Therapy

## 2018-02-18 DIAGNOSIS — R2681 Unsteadiness on feet: Secondary | ICD-10-CM | POA: Insufficient documentation

## 2018-02-18 DIAGNOSIS — R2689 Other abnormalities of gait and mobility: Secondary | ICD-10-CM

## 2018-02-18 DIAGNOSIS — M6281 Muscle weakness (generalized): Secondary | ICD-10-CM | POA: Diagnosis present

## 2018-02-18 DIAGNOSIS — M4807 Spinal stenosis, lumbosacral region: Secondary | ICD-10-CM

## 2018-02-18 NOTE — Therapy (Signed)
[place], [place], [address] Phone: [phone]   Fax:  [phone]  Physical Therapy Treatment  Patient Details  Name: Edward [name] MRN: [id number] Date of Birth: 1970/10/14 Referring Provider: Jean Rosenthal   Encounter Date: 02/18/2018  PT End of Session - 02/18/18 1109    Visit Number  4    Number of Visits  13    Date for PT Re-Evaluation  03/03/18    Authorization Type  self-pay     PT Start Time  1100    PT Stop Time  1145    PT Time Calculation (min)  45 min       Past Medical History:  Diagnosis Date  . Anxiety    self reported  . Arthritis   . Depression    self reported  . Diabetes mellitus   . Hypertension     Past Surgical History:  Procedure Laterality Date  . AMPUTATION Left 05/10/2016   Procedure: Left Transmetatarsal Amputation;  Surgeon: [name], MD;  Location: [facility];  Service: Orthopedics;  Laterality: Left;  . CIRCUMCISION  09/02/2011   Procedure: CIRCUMCISION ADULT;  Surgeon: Molli Hazard, MD;  Location: [facility];  Service: Urology;  Laterality: N/A;  . I&D EXTREMITY  09/02/2011   Procedure: IRRIGATION AND DEBRIDEMENT EXTREMITY;  Surgeon: Theodoro Kos, DO;  Location: [facility];  Service: Plastics;  Laterality: N/A;  . INCISION AND DRAINAGE OF WOUND  08/12/2011   Procedure: IRRIGATION AND DEBRIDEMENT WOUND;  Surgeon: Zenovia Jarred, MD;  Location: [facility];  Service: General;;  perineum  . IRRIGATION AND DEBRIDEMENT ABSCESS  08/12/2011   Procedure: IRRIGATION AND DEBRIDEMENT ABSCESS;  Surgeon: Molli Hazard, MD;  Location: [facility];  Service: Urology;  Laterality: N/A;  irrigation and debridment perineum    . IRRIGATION AND DEBRIDEMENT ABSCESS  08/14/2011   Procedure: MINOR INCISION AND DRAINAGE OF ABSCESS;  Surgeon: Molli Hazard, MD;  Location: [facility];  Service: Urology;  Laterality: N/A;  Perineal Wound  Debridement;Placement Bilateral Testicular Thigh Pouches  . TOTAL HIP ARTHROPLASTY Left 10/11/2016   Procedure: LEFT TOTAL HIP ARTHROPLASTY ANTERIOR APPROACH;  Surgeon: Mcarthur Rossetti, MD;  [facility];  Service: Orthopedics;  Laterality: Left;    There were no vitals filed for this visit.  Subjective Assessment - 02/18/18 1108    Subjective  Saw MD yesterday. He is ordering an MRI of my back.    Currently in Pain?  Yes    Pain Score  5     Pain Location  Back uppe gluteal     Pain Orientation  Right    Pain Descriptors / Indicators  Sore    Aggravating Factors   increased after therex    Pain Relieving Factors  rest                        [facility] Adult PT Treatment/Exercise - 02/18/18 0001      Knee/Hip Exercises: Aerobic   Nustep  L4 LE only  reduced from level 5 at first       Knee/Hip Exercises: Standing     --    Other Standing Knee Exercises  standing 2  way hip  bilateral x 10 each       Knee/Hip Exercises: Seated   Long Arc Quad  Both;AROM;Strengthening;10 reps;2 sets 3#      Knee/Hip Exercises: Supine   Heel Slides  10 reps with ab draw in      Manual Therapy   Manual Therapy  Soft tissue mobilization    Soft tissue mobilization  massage roller to left ITB/Vastus lateralis also left gluteal          Balance Exercises - 02/18/18 1127      Balance Exercises: Standing   Standing Eyes Closed  2 reps;30 secs narrow base    Tandem Stance  Eyes open;Eyes closed;2 reps;25 secs;30 secs 1/2 tandem, head turns          PT Short Term Goals - 01/20/18 1215      PT SHORT TERM GOAL #1   Title  pt to be I with inital HEP     Baseline  no previous HEP    Time  3    Period  Weeks    Status  New    Target Date  02/10/18      PT SHORT TERM GOAL #2   Title  assess berg balance and LTG appropriately     Time  2    Period  Weeks    Status  New    Target Date  02/03/18        PT Long Term Goals - 02/02/18 1445      PT LONG TERM  GOAL #1   Title  pt increase bil ankle DF to >/= 6 degrees to promote functoinal and efficient gait pattern to reduce catching foot on floor and reduce falls    Baseline  R ankle 0 and L ankle 3    Time  6    Period  Weeks    Status  New      PT LONG TERM GOAL #2   Title  improve bil LE strength to >/= 4/5 to promote safety with standing/ walking activities and maximixe safety    Baseline  ankle 3+/5 in all planes bil, bil hips 4-/5 except for R hip flexoin 3+/5, bil knees 4/5 except for L knee flexoin 4-/5    Time  6    Period  Weeks    Status  New      PT LONG TERM GOAL #3   Title  pt to be able to walk/ stand >/= 45 min with LRAD reporting no stiffness/ tightness for community distances    Baseline  walk/ standing 10-15 min    Time  6    Period  Weeks    Status  New      PT LONG TERM GOAL #4   Title  improve FOTO to </= 53% limited to demo improvement in function     Baseline  FOTO 74% limited    Time  6    Period  Weeks    Status  New      PT LONG TERM GOAL #5   Title  pt to be I with all HEP given as of last visit maintain and progress current level of function    Baseline  no previous HEP    Time  6    Period  Weeks    Status  New      PT LONG TERM GOAL #6   Title  Pt will increase BERG balance score to >/= 45/56 to decrease fall risk and improve standing balance with LRAD    Baseline  34/56    Time  6    Period  Weeks    Status  New    Target Date  03/03/18  Plan - 02/18/18 1152    Clinical Impression Statement  He reports continued forgetfulness however forgot to mention it to Dr. Ninfa Linden yesterday, He will have a lumbar MRI soon. Continued standing balance  and closed and open chain LE strengthening. Quick fatigue noted. Pain with left heel slides at lateral knee. Manual work to right vastus lateralis and ITB. Also manual to right gluteal where he reports area of increased tenderness. At end of session pt reports he can still feel right gluteal  pain.     PT Next Visit Plan  review/ update HEP PRN, calf & hamstring stretching/ soft tissue work. hip/ knee/ ankle strengthening, muscle energy technique, balance, modalities prn    PT Home Exercise Plan  seated calf stretching, 4 way ankle stretch, sit to stand, hamstring stretch, clamshells; added SLR and lower trunk rotations    Consulted and Agree with Plan of Care  Patient       Patient will benefit from skilled therapeutic intervention in order to improve the following deficits and impairments:  Abnormal gait, Decreased activity tolerance, Decreased endurance, Improper body mechanics, Postural dysfunction, Decreased safety awareness, Decreased strength, Decreased range of motion, Increased fascial restricitons  Visit Diagnosis: Other abnormalities of gait and mobility  Unsteadiness on feet  Muscle weakness (generalized)     Problem List Patient Active Problem List   Diagnosis Date Noted  . Viral pharyngitis 01/14/2017  . Cigarette smoker 01/14/2017  . Elevated blood pressure reading 01/14/2017  . Avascular necrosis of hip, left ([facility]) 10/11/2016  . Status post left hip replacement 10/11/2016  . Unilateral primary osteoarthritis, left hip 09/18/2016  . S/P transmetatarsal amputation of foot, left ([facility]) 07/25/2016  . Balance problem 07/25/2016  . Sepsis ([facility]) 05/07/2016  . ARF (acute renal failure) ([facility]) 05/07/2016  . Fournier's gangrene 08/16/2011  . Acute respiratory failure ([facility]) 08/16/2011  . Septic shock(785.52) 08/16/2011  . Diabetic osteomyelitis ([facility]) 08/16/2011    Dorene Ar, PTA 02/18/2018, 12:04 PM  [facility] [address] [place], [place], [address] Phone: [phone]   Fax:  [phone]  Name: Edward [name] MRN: [id number] Date of Birth: Dec 30, 1970

## 2018-02-23 ENCOUNTER — Ambulatory Visit: Payer: Medicaid Other | Admitting: Physical Therapy

## 2018-02-23 ENCOUNTER — Encounter: Payer: Self-pay | Admitting: Physical Therapy

## 2018-02-23 DIAGNOSIS — R2689 Other abnormalities of gait and mobility: Secondary | ICD-10-CM | POA: Diagnosis not present

## 2018-02-23 DIAGNOSIS — M6281 Muscle weakness (generalized): Secondary | ICD-10-CM

## 2018-02-23 DIAGNOSIS — R2681 Unsteadiness on feet: Secondary | ICD-10-CM

## 2018-02-23 NOTE — Therapy (Signed)
[facility] [place], [place], [address] Phone: [phone]   Fax:  [phone]  Physical Therapy Treatment  Patient Details  Name: Edward [name] MRN: [id number] Date of Birth: 04-05-1971 Referring Provider: Jean Rosenthal   Encounter Date: 02/23/2018  PT End of Session - 02/23/18 0937    Visit Number  5    Number of Visits  13    Date for PT Re-Evaluation  03/03/18    Authorization Type  self-pay     PT Start Time  0932    PT Stop Time  1015    PT Time Calculation (min)  43 min       Past Medical History:  Diagnosis Date  . Anxiety    self reported  . Arthritis   . Depression    self reported  . Diabetes mellitus   . Hypertension     Past Surgical History:  Procedure Laterality Date  . AMPUTATION Left 05/10/2016   Procedure: Left Transmetatarsal Amputation;  Surgeon: [name], MD;  Location: [facility];  Service: Orthopedics;  Laterality: Left;  . CIRCUMCISION  09/02/2011   Procedure: CIRCUMCISION ADULT;  Surgeon: Molli Hazard, MD;  Location: [facility];  Service: Urology;  Laterality: N/A;  . I&D EXTREMITY  09/02/2011   Procedure: IRRIGATION AND DEBRIDEMENT EXTREMITY;  Surgeon: Theodoro Kos, DO;  Location: [facility];  Service: Plastics;  Laterality: N/A;  . INCISION AND DRAINAGE OF WOUND  08/12/2011   Procedure: IRRIGATION AND DEBRIDEMENT WOUND;  Surgeon: Zenovia Jarred, MD;  Location: [facility];  Service: General;;  perineum  . IRRIGATION AND DEBRIDEMENT ABSCESS  08/12/2011   Procedure: IRRIGATION AND DEBRIDEMENT ABSCESS;  Surgeon: Molli Hazard, MD;  Location: [facility];  Service: Urology;  Laterality: N/A;  irrigation and debridment perineum    . IRRIGATION AND DEBRIDEMENT ABSCESS  08/14/2011   Procedure: MINOR INCISION AND DRAINAGE OF ABSCESS;  Surgeon: Molli Hazard, MD;  Location: [facility];  Service: Urology;  Laterality: N/A;  Perineal Wound  Debridement;Placement Bilateral Testicular Thigh Pouches  . TOTAL HIP ARTHROPLASTY Left 10/11/2016   Procedure: LEFT TOTAL HIP ARTHROPLASTY ANTERIOR APPROACH;  Surgeon: Mcarthur Rossetti, MD;  [facility];  Service: Orthopedics;  Laterality: Left;    There were no vitals filed for this visit.  Subjective Assessment - 02/23/18 0935    Subjective  Not feeling any pain this morning, Does report N/T in bilateral calves with gait.     Currently in Pain?  No/denies    Pain Orientation  Right                       [facility] Adult PT Treatment/Exercise - 02/23/18 0001      Knee/Hip Exercises: Stretches   Other Knee/Hip Stretches  seated DF stretch with towel bilateral 2 x 30 sec      Knee/Hip Exercises: Aerobic   Nustep  L3 Le only x 6 min      Knee/Hip Exercises: Standing   Other Standing Knee Exercises  small squats with cues for hip hinge- very unsteady x 10 -min guard.       Knee/Hip Exercises: Seated   Long Arc Quad  Both;AROM;Strengthening;10 reps;2 sets 3#    Hamstring Curl  20 reps red band     Sit to Sand  5 reps UE to rise, attempts to control descent, unable          Balance  Exercises - 02/23/18 1022      Balance Exercises: Standing   Standing Eyes Opened  Narrow base of support (BOS);Head turns and trunk rotations    Standing Eyes Closed  2 reps;30 secs narrow base    Tandem Stance  Eyes open;Intermittent upper extremity support;5 reps    Tandem Gait  Forward;Retro;Intermittent upper extremity support;2 reps    Retro Gait  Upper extremity support;2 reps retro tandem    Sidestepping  Upper extremity support    Turning  Both          PT Short Term Goals - 01/20/18 1215      PT SHORT TERM GOAL #1   Title  pt to be I with inital HEP     Baseline  no previous HEP    Time  3    Period  Weeks    Status  New    Target Date  02/10/18      PT SHORT TERM GOAL #2   Title  assess berg balance and LTG appropriately     Time  2    Period  Weeks     Status  New    Target Date  02/03/18        PT Long Term Goals - 02/02/18 1445      PT LONG TERM GOAL #1   Title  pt increase bil ankle DF to >/= 6 degrees to promote functoinal and efficient gait pattern to reduce catching foot on floor and reduce falls    Baseline  R ankle 0 and L ankle 3    Time  6    Period  Weeks    Status  New      PT LONG TERM GOAL #2   Title  improve bil LE strength to >/= 4/5 to promote safety with standing/ walking activities and maximixe safety    Baseline  ankle 3+/5 in all planes bil, bil hips 4-/5 except for R hip flexoin 3+/5, bil knees 4/5 except for L knee flexoin 4-/5    Time  6    Period  Weeks    Status  New      PT LONG TERM GOAL #3   Title  pt to be able to walk/ stand >/= 45 min with LRAD reporting no stiffness/ tightness for community distances    Baseline  walk/ standing 10-15 min    Time  6    Period  Weeks    Status  New      PT LONG TERM GOAL #4   Title  improve FOTO to </= 53% limited to demo improvement in function     Baseline  FOTO 74% limited    Time  6    Period  Weeks    Status  New      PT LONG TERM GOAL #5   Title  pt to be I with all HEP given as of last visit maintain and progress current level of function    Baseline  no previous HEP    Time  6    Period  Weeks    Status  New      PT LONG TERM GOAL #6   Title  Pt will increase BERG balance score to >/= 45/56 to decrease fall risk and improve standing balance with LRAD    Baseline  34/56    Time  6    Period  Weeks    Status  New    Target Date  03/03/18            Plan - 02/23/18 0937    Clinical Impression Statement  Pt reports massage roller treament to right gluteal helpful last visit. He did note soreness in his legs after last visit. No pain this morning however two episodes of LOB, tripping over left shoe as he walked into clinic. He is not wearing spacer for left foot amputation as is wearing slide on crocs. Asked him to wear sneakers with  spacer at futire appointments. Focused closed chain balance exercises for most of treatment. Worked on knee strength as pt cannot rise or sit without significant UE use. No increased pain at end of session.     PT Next Visit Plan  review/ update HEP PRN, calf & hamstring stretching/ soft tissue work. hip/ knee/ ankle strengthening, muscle energy technique, balance, modalities prn    PT Home Exercise Plan  seated calf stretching, 4 way ankle stretch, sit to stand, hamstring stretch, clamshells; added SLR and lower trunk rotations    Consulted and Agree with Plan of Care  Patient       Patient will benefit from skilled therapeutic intervention in order to improve the following deficits and impairments:  Abnormal gait, Decreased activity tolerance, Decreased endurance, Improper body mechanics, Postural dysfunction, Decreased safety awareness, Decreased strength, Decreased range of motion, Increased fascial restricitons  Visit Diagnosis: Other abnormalities of gait and mobility  Unsteadiness on feet  Muscle weakness (generalized)     Problem List Patient Active Problem List   Diagnosis Date Noted  . Viral pharyngitis 01/14/2017  . Cigarette smoker 01/14/2017  . Elevated blood pressure reading 01/14/2017  . Avascular necrosis of hip, left ([facility]) 10/11/2016  . Status post left hip replacement 10/11/2016  . Unilateral primary osteoarthritis, left hip 09/18/2016  . S/P transmetatarsal amputation of foot, left ([facility]) 07/25/2016  . Balance problem 07/25/2016  . Sepsis ([facility]) 05/07/2016  . ARF (acute renal failure) ([facility]) 05/07/2016  . Fournier's gangrene 08/16/2011  . Acute respiratory failure ([facility]) 08/16/2011  . Septic shock(785.52) 08/16/2011  . Diabetic osteomyelitis ([facility]) 08/16/2011    Dorene Ar, PTA 02/23/2018, 10:34 AM  [facility] [address] [place], [place], [address] Phone: [phone]   Fax:  [phone]  Name:  Edward [name] MRN: [id number] Date of Birth: 01-27-1971

## 2018-02-24 ENCOUNTER — Ambulatory Visit (INDEPENDENT_AMBULATORY_CARE_PROVIDER_SITE_OTHER): Payer: Self-pay | Admitting: Orthopaedic Surgery

## 2018-02-25 ENCOUNTER — Ambulatory Visit: Payer: Medicaid Other | Admitting: Physical Therapy

## 2018-02-25 ENCOUNTER — Telehealth: Payer: Self-pay | Admitting: Physical Therapy

## 2018-02-25 NOTE — Telephone Encounter (Signed)
Left message regarding No-Show to appointment this morning. Left next appointment time and asked patient to call prior to appointment if he needs to cancel or reschedule.

## 2018-03-03 ENCOUNTER — Ambulatory Visit: Payer: Medicaid Other | Admitting: Physical Therapy

## 2018-03-03 DIAGNOSIS — M6281 Muscle weakness (generalized): Secondary | ICD-10-CM

## 2018-03-03 DIAGNOSIS — R2681 Unsteadiness on feet: Secondary | ICD-10-CM

## 2018-03-03 DIAGNOSIS — R2689 Other abnormalities of gait and mobility: Secondary | ICD-10-CM

## 2018-03-03 NOTE — Therapy (Signed)
[place], [place], [address] Phone: [phone]   Fax:  [phone]  Physical Therapy Treatment/Re-certification  Patient Details  Name: Edward [name] MRN: [id number] Date of Birth: October 19, 1970 Referring Provider: Jean Rosenthal   Encounter Date: 03/03/2018  PT End of Session - 03/03/18 0931    Visit Number  6    Number of Visits  19    Date for PT Re-Evaluation  04/14/18    PT Start Time  0848    PT Stop Time  0930    PT Time Calculation (min)  42 min    Activity Tolerance  Patient tolerated treatment well;Patient limited by fatigue    Behavior During Therapy  [facility] for tasks assessed/performed       Past Medical History:  Diagnosis Date  . Anxiety    self reported  . Arthritis   . Depression    self reported  . Diabetes mellitus   . Hypertension     Past Surgical History:  Procedure Laterality Date  . AMPUTATION Left 05/10/2016   Procedure: Left Transmetatarsal Amputation;  Surgeon: [name], MD;  Location: [place];  Service: Orthopedics;  Laterality: Left;  . CIRCUMCISION  09/02/2011   Procedure: CIRCUMCISION ADULT;  Surgeon: Molli Hazard, MD;  Location: [place];  Service: Urology;  Laterality: N/A;  . I&D EXTREMITY  09/02/2011   Procedure: IRRIGATION AND DEBRIDEMENT EXTREMITY;  Surgeon: Theodoro Kos, DO;  Location: [place];  Service: Plastics;  Laterality: N/A;  . INCISION AND DRAINAGE OF WOUND  08/12/2011   Procedure: IRRIGATION AND DEBRIDEMENT WOUND;  Surgeon: Zenovia Jarred, MD;  Location: [place];  Service: General;;  perineum  . IRRIGATION AND DEBRIDEMENT ABSCESS  08/12/2011   Procedure: IRRIGATION AND DEBRIDEMENT ABSCESS;  Surgeon: Molli Hazard, MD;  Location: [place];  Service: Urology;  Laterality: N/A;  irrigation and debridment perineum    . IRRIGATION AND DEBRIDEMENT ABSCESS  08/14/2011   Procedure: MINOR INCISION AND  DRAINAGE OF ABSCESS;  Surgeon: Molli Hazard, MD;  Location: [place];  Service: Urology;  Laterality: N/A;  Perineal Wound Debridement;Placement Bilateral Testicular Thigh Pouches  . TOTAL HIP ARTHROPLASTY Left 10/11/2016   Procedure: LEFT TOTAL HIP ARTHROPLASTY ANTERIOR APPROACH;  Surgeon: Mcarthur Rossetti, MD;  [facility];  Service: Orthopedics;  Laterality: Left;    There were no vitals filed for this visit.  Subjective Assessment - 03/03/18 0846    Subjective  "I feel fine this morning. I am a little achy but no pain. I go for an MRI on the 22nd."    Currently in Pain?  No/denies    Pain Score  0-No pain         [facility] PT Assessment - 03/03/18 0001      AROM   Right Ankle Dorsiflexion  0    Left Ankle Dorsiflexion  0      PROM   Right Ankle Dorsiflexion  5    Left Ankle Dorsiflexion  5      Strength   Right Hip Flexion  4/5    Right Hip Extension  3+/5    Right Hip ABduction  4-/5    Right Hip ADduction  4-/5    Left Hip Flexion  4-/5    Left Hip Extension  3/5    Left Hip ABduction  4-/5    Left Hip ADduction  4-/5    Right Knee Flexion  4+/5  Right Knee Extension  4/5    Left Knee Flexion  4+/5    Left Knee Extension  4/5    Right Ankle Dorsiflexion  4/5    Right Ankle Plantar Flexion  4/5    Right Ankle Inversion  3+/5    Right Ankle Eversion  3+/5    Left Ankle Dorsiflexion  4/5    Left Ankle Plantar Flexion  3+/5    Left Ankle Inversion  3+/5    Left Ankle Eversion  3+/5      Berg Balance Test   Sit to Stand  Able to stand  independently using hands    Standing Unsupported  Able to stand safely 2 minutes    Sitting with Back Unsupported but Feet Supported on Floor or Stool  Able to sit safely and securely 2 minutes    Stand to Sit  Controls descent by using hands    Transfers  Able to transfer safely, definite need of hands    Standing Unsupported with Eyes Closed  Able to stand 10 seconds safely    Standing Ubsupported with Feet  Together  Able to place feet together independently and stand 1 minute safely    From Standing, Reach Forward with Outstretched Arm  Can reach forward >12 cm safely (5")    From Standing Position, Pick up Object from Floor  Able to pick up shoe, needs supervision    From Standing Position, Turn to Look Behind Over each Shoulder  Looks behind one side only/other side shows less weight shift    Turn 360 Degrees  Able to turn 360 degrees safely but slowly    Standing Unsupported, Alternately Place Feet on Step/Stool  Needs assistance to keep from falling or unable to try    Standing Unsupported, One Foot in [employer] balance while stepping or standing    Standing on One Leg  Unable to try or needs assist to prevent fall    Total Score  36                           PT Education - 03/03/18 0931    Education Details  BERG balance and fall risk    Person(s) Educated  Patient    Methods  Explanation    Comprehension  Verbalized understanding       PT Short Term Goals - 03/03/18 7858      PT SHORT TERM GOAL #1   Title  pt to be I with inital HEP     Status  On-going      PT SHORT TERM GOAL #2   Title  assess berg balance and LTG appropriately     Status  On-going        PT Long Term Goals - 03/03/18 0939      PT LONG TERM GOAL #1   Title  pt increase bil ankle DF to >/= 6 degrees to promote functoinal and efficient gait pattern to reduce catching foot on floor and reduce falls    Status  On-going      PT LONG TERM GOAL #2   Title  improve bil LE strength to >/= 4/5 to promote safety with standing/ walking activities and maximixe safety    Status  On-going      PT LONG TERM GOAL #3   Title  pt to be able to walk/ stand >/= 45 min with LRAD reporting no stiffness/ tightness for community distances  Status  Unable to assess      PT LONG TERM GOAL #4   Title  improve FOTO to </= 53% limited to demo improvement in function     Status  Unable to assess       PT LONG TERM GOAL #5   Title  pt to be I with all HEP given as of last visit maintain and progress current level of function    Status  On-going      PT LONG TERM GOAL #6   Title  Pt will increase BERG balance score to >/= 45/56 to decrease fall risk and improve standing balance with LRAD    Status  On-going            Plan - 03/03/18 0934    Clinical Impression Statement  Reassessment performed today. Pt still demonstrating some strength deficits in knees, hips, and ankles, but has shown improvement in various areas. He still lacks adequate dorsiflexion AROM in bil ankles. Berg balance also reassessed today; pt improved from 34 to 36 (pt still high fall risk).  He would continue to benefit from OPPT services to address ROM, strength, balance, and safe functional mobility.     Rehab Potential  Good    PT Frequency  2x / week    PT Duration  6 weeks    PT Treatment/Interventions  ADLs/Self Care Home Management;Cryotherapy;Electrical Stimulation;Ultrasound;Moist Heat;Gait training;Stair training;Functional mobility training;Therapeutic activities;Therapeutic exercise;Manual techniques;Taping;Patient/family education;Passive range of motion;Balance training;Dry needling    PT Next Visit Plan  review/ update HEP PRN, calf & hamstring stretching/ soft tissue work. hip/ knee/ ankle strengthening, muscle energy technique, balance, modalities prn    PT Home Exercise Plan  seated calf stretching, 4 way ankle stretch, sit to stand, hamstring stretch, clamshells; added SLR and lower trunk rotations    Consulted and Agree with Plan of Care  Patient       Patient will benefit from skilled therapeutic intervention in order to improve the following deficits and impairments:  Abnormal gait, Decreased activity tolerance, Decreased endurance, Improper body mechanics, Postural dysfunction, Decreased safety awareness, Decreased strength, Decreased range of motion, Increased fascial restricitons  Visit  Diagnosis: Other abnormalities of gait and mobility  Unsteadiness on feet  Muscle weakness (generalized)     Problem List Patient Active Problem List   Diagnosis Date Noted  . Viral pharyngitis 01/14/2017  . Cigarette smoker 01/14/2017  . Elevated blood pressure reading 01/14/2017  . Avascular necrosis of hip, left ([place]) 10/11/2016  . Status post left hip replacement 10/11/2016  . Unilateral primary osteoarthritis, left hip 09/18/2016  . S/P transmetatarsal amputation of foot, left ([place]) 07/25/2016  . Balance problem 07/25/2016  . Sepsis ([place]) 05/07/2016  . ARF (acute renal failure) ([place]) 05/07/2016  . Fournier's gangrene 08/16/2011  . Acute respiratory failure ([place]) 08/16/2011  . Septic shock(785.52) 08/16/2011  . Diabetic osteomyelitis ([place]) 08/16/2011   Worthy Flank, SPT 03/03/18 10:17 AM   [facility] [address] [place], [place], [address] Phone: [phone]   Fax:  [phone]  Name: Edward [name] MRN: [id number] Date of Birth: [birth year]

## 2018-03-05 ENCOUNTER — Encounter: Payer: Self-pay | Admitting: Physical Therapy

## 2018-03-05 ENCOUNTER — Ambulatory Visit: Payer: Medicaid Other | Admitting: Physical Therapy

## 2018-03-05 DIAGNOSIS — R2689 Other abnormalities of gait and mobility: Secondary | ICD-10-CM | POA: Diagnosis not present

## 2018-03-05 DIAGNOSIS — R2681 Unsteadiness on feet: Secondary | ICD-10-CM

## 2018-03-05 DIAGNOSIS — M6281 Muscle weakness (generalized): Secondary | ICD-10-CM

## 2018-03-05 NOTE — Therapy (Signed)
[place], [place], [address] Phone: [phone]   Fax:  [phone]  Physical Therapy Treatment  Patient Details  Name: Edward [name] MRN: [id number] Date of Birth: September 11, 1970 Referring Provider: Jean Rosenthal   Encounter Date: 03/05/2018  PT End of Session - 03/05/18 0805    Visit Number  7    Number of Visits  19    Date for PT Re-Evaluation  04/14/18    PT Start Time  0805    PT Stop Time  0845    PT Time Calculation (min)  40 min    Activity Tolerance  Patient tolerated treatment well    Behavior During Therapy  [facility] for tasks assessed/performed       Past Medical History:  Diagnosis Date  . Anxiety    self reported  . Arthritis   . Depression    self reported  . Diabetes mellitus   . Hypertension     Past Surgical History:  Procedure Laterality Date  . AMPUTATION Left 05/10/2016   Procedure: Left Transmetatarsal Amputation;  Surgeon: [name], MD;  Location: [place];  Service: Orthopedics;  Laterality: Left;  . CIRCUMCISION  09/02/2011   Procedure: CIRCUMCISION ADULT;  Surgeon: Molli Hazard, MD;  Location: [place];  Service: Urology;  Laterality: N/A;  . I&D EXTREMITY  09/02/2011   Procedure: IRRIGATION AND DEBRIDEMENT EXTREMITY;  Surgeon: Theodoro Kos, DO;  Location: [place];  Service: Plastics;  Laterality: N/A;  . INCISION AND DRAINAGE OF WOUND  08/12/2011   Procedure: IRRIGATION AND DEBRIDEMENT WOUND;  Surgeon: Zenovia Jarred, MD;  Location: [place];  Service: General;;  perineum  . IRRIGATION AND DEBRIDEMENT ABSCESS  08/12/2011   Procedure: IRRIGATION AND DEBRIDEMENT ABSCESS;  Surgeon: Molli Hazard, MD;  Location: [place];  Service: Urology;  Laterality: N/A;  irrigation and debridment perineum    . IRRIGATION AND DEBRIDEMENT ABSCESS  08/14/2011   Procedure: MINOR INCISION AND DRAINAGE OF ABSCESS;  Surgeon: Molli Hazard, MD;  Location: [place];  Service: Urology;  Laterality: N/A;  Perineal Wound Debridement;Placement Bilateral Testicular Thigh Pouches  . TOTAL HIP ARTHROPLASTY Left 10/11/2016   Procedure: LEFT TOTAL HIP ARTHROPLASTY ANTERIOR APPROACH;  Surgeon: Mcarthur Rossetti, MD;  [facility];  Service: Orthopedics;  Laterality: Left;    There were no vitals filed for this visit.  Subjective Assessment - 03/05/18 0805    Subjective  "I am feeling alittle sore in my low back today at about a 2/10." pt didn't bring his [facility] today reporting he left it in his sons car and his son left early this morning.     Pain Score  2     Pain Location  Back    Pain Orientation  Right    Pain Descriptors / Indicators  Sore    Pain Onset  More than a month ago    Pain Frequency  Intermittent    Aggravating Factors   walking/ standing         [facility] PT Assessment - 03/05/18 0818      Observation/Other Assessments   Focus on Therapeutic Outcomes (FOTO)   74% limited      Sensation   Additional Comments  decreased light touch in L3, L4 dermatome in the RLE compared bil.                    [place] Adult PT Treatment/Exercise -  03/05/18 0819      Lumbar Exercises: Stretches   Active Hamstring Stretch  3 reps;30 seconds PNF contract/ relax    Lower Trunk Rotation  -- 1 x 20 with cues to stay in pain free range      Knee/Hip Exercises: Aerobic   Nustep  L5 x 6 min LE      Knee/Hip Exercises: Seated   Long Arc Quad  2 sets;Both;10 reps;Weights    Long Arc Quad Weight  3 lbs.    Sit to Sand  1 set;10 reps      Knee/Hip Exercises: Supine   Bridges  1 set;15 reps;Both;Strengthening with glute squeeze    Straight Leg Raises  2 sets;10 reps;Both;Strengthening verbal cues for breathing      Manual Therapy   Manual Therapy  Joint mobilization    Joint Mobilization  grade 2-3 LAD RLE only             PT Education - 03/05/18 0811    Education Details  discussed importance of  keeping cane with him at all times, rather him have it and not need it vs need it and not have it.   Updated HEP for bridges.     Person(s) Educated  Patient    Methods  Explanation;Verbal cues    Comprehension  Verbalized understanding;Verbal cues required       PT Short Term Goals - 03/03/18 8502      PT SHORT TERM GOAL #1   Title  pt to be I with inital HEP     Status  On-going      PT SHORT TERM GOAL #2   Title  assess berg balance and LTG appropriately     Status  On-going        PT Long Term Goals - 03/03/18 0939      PT LONG TERM GOAL #1   Title  pt increase bil ankle DF to >/= 6 degrees to promote functoinal and efficient gait pattern to reduce catching foot on floor and reduce falls    Status  On-going      PT LONG TERM GOAL #2   Title  improve bil LE strength to >/= 4/5 to promote safety with standing/ walking activities and maximixe safety    Status  On-going      PT LONG TERM GOAL #3   Title  pt to be able to walk/ stand >/= 45 min with LRAD reporting no stiffness/ tightness for community distances    Status  Unable to assess      PT LONG TERM GOAL #4   Title  improve FOTO to </= 53% limited to demo improvement in function     Status  Unable to assess      PT LONG TERM GOAL #5   Title  pt to be I with all HEP given as of last visit maintain and progress current level of function    Status  On-going      PT LONG TERM GOAL #6   Title  Pt will increase BERG balance score to >/= 45/56 to decrease fall risk and improve standing balance with LRAD    Status  On-going            Plan - 03/05/18 7741    Clinical Impression Statement  pt arrived 5 min late today reporting 2/10 pain inthe low back. continued stretching of the hamstrings and hip/ quad strengthening which he performed well but fatigued quickly. educated benefits  of keeping cane with him at all times for safety purposes. updated HEP to include bridges.     PT Treatment/Interventions  ADLs/Self  Care Home Management;Cryotherapy;Electrical Stimulation;Ultrasound;Moist Heat;Gait training;Stair training;Functional mobility training;Therapeutic activities;Therapeutic exercise;Manual techniques;Taping;Patient/family education;Passive range of motion;Balance training;Dry needling    PT Next Visit Plan  review/ update HEP PRN, calf & hamstring stretching/ soft tissue work. hip/ knee/ ankle strengthening, muscle energy technique, balance, modalities prn    PT Home Exercise Plan  seated calf stretching, 4 way ankle stretch, sit to stand, hamstring stretch, clamshells; added SLR and lower trunk rotations, bridge.     Consulted and Agree with Plan of Care  Patient       Patient will benefit from skilled therapeutic intervention in order to improve the following deficits and impairments:  Abnormal gait, Decreased activity tolerance, Decreased endurance, Improper body mechanics, Postural dysfunction, Decreased safety awareness, Decreased strength, Decreased range of motion, Increased fascial restricitons  Visit Diagnosis: Other abnormalities of gait and mobility  Unsteadiness on feet  Muscle weakness (generalized)     Problem List Patient Active Problem List   Diagnosis Date Noted  . Viral pharyngitis 01/14/2017  . Cigarette smoker 01/14/2017  . Elevated blood pressure reading 01/14/2017  . Avascular necrosis of hip, left ([place]) 10/11/2016  . Status post left hip replacement 10/11/2016  . Unilateral primary osteoarthritis, left hip 09/18/2016  . S/P transmetatarsal amputation of foot, left ([place]) 07/25/2016  . Balance problem 07/25/2016  . Sepsis ([place]) 05/07/2016  . ARF (acute renal failure) ([place]) 05/07/2016  . Fournier's gangrene 08/16/2011  . Acute respiratory failure ([place]) 08/16/2011  . Septic shock(785.52) 08/16/2011  . Diabetic osteomyelitis ([place]) 08/16/2011   [place] PT, DPT, LAT, ATC  03/05/18  8:56 AM      [place] [facility] [address] [place], [place], [address] Phone: [phone]   Fax:  [phone]  Name: Edward [name] MRN: [id number] Date of Birth: May 05, 1971

## 2018-03-09 ENCOUNTER — Ambulatory Visit
Admission: RE | Admit: 2018-03-09 | Discharge: 2018-03-09 | Disposition: A | Discharge status: Home / Self Care | Payer: No Typology Code available for payment source | Source: Ambulatory Visit | Attending: Orthopaedic Surgery | Admitting: Orthopaedic Surgery

## 2018-03-09 DIAGNOSIS — M4807 Spinal stenosis, lumbosacral region: Secondary | ICD-10-CM

## 2018-03-10 ENCOUNTER — Ambulatory Visit: Payer: Medicaid Other | Admitting: Physical Therapy

## 2018-03-11 ENCOUNTER — Ambulatory Visit (INDEPENDENT_AMBULATORY_CARE_PROVIDER_SITE_OTHER): Payer: Self-pay | Admitting: Orthopaedic Surgery

## 2018-03-12 ENCOUNTER — Ambulatory Visit: Payer: Medicaid Other | Admitting: Physical Therapy

## 2018-03-12 ENCOUNTER — Encounter: Payer: Self-pay | Admitting: Physical Therapy

## 2018-03-12 DIAGNOSIS — R2689 Other abnormalities of gait and mobility: Secondary | ICD-10-CM | POA: Diagnosis not present

## 2018-03-12 DIAGNOSIS — R2681 Unsteadiness on feet: Secondary | ICD-10-CM

## 2018-03-12 DIAGNOSIS — M6281 Muscle weakness (generalized): Secondary | ICD-10-CM

## 2018-03-12 NOTE — Therapy (Addendum)
[place], [place], [address] Phone: [phone]   Fax:  [phone]  Physical Therapy Treatment  Patient Details  Name: Edward [name] MRN: [id number] Date of Birth: November 06, 1970 Referring Provider: Jean Rosenthal   Encounter Date: 03/12/2018  PT End of Session - 03/12/18 1455    Visit Number  8    Number of Visits  19    Date for PT Re-Evaluation  04/14/18    Authorization Type  self-pay     PT Start Time  1415    PT Stop Time  1500    PT Time Calculation (min)  45 min    Activity Tolerance  Patient tolerated treatment well;Patient limited by pain    Behavior During Therapy  [facility] for tasks assessed/performed       Past Medical History:  Diagnosis Date  . Anxiety    self reported  . Arthritis   . Depression    self reported  . Diabetes mellitus   . Hypertension     Past Surgical History:  Procedure Laterality Date  . AMPUTATION Left 05/10/2016   Procedure: Left Transmetatarsal Amputation;  Surgeon: [name], MD;  Location: [place];  Service: Orthopedics;  Laterality: Left;  . CIRCUMCISION  09/02/2011   Procedure: CIRCUMCISION ADULT;  Surgeon: Molli Hazard, MD;  Location: [place];  Service: Urology;  Laterality: N/A;  . I&D EXTREMITY  09/02/2011   Procedure: IRRIGATION AND DEBRIDEMENT EXTREMITY;  Surgeon: Theodoro Kos, DO;  Location: [place];  Service: Plastics;  Laterality: N/A;  . INCISION AND DRAINAGE OF WOUND  08/12/2011   Procedure: IRRIGATION AND DEBRIDEMENT WOUND;  Surgeon: Zenovia Jarred, MD;  Location: [place];  Service: General;;  perineum  . IRRIGATION AND DEBRIDEMENT ABSCESS  08/12/2011   Procedure: IRRIGATION AND DEBRIDEMENT ABSCESS;  Surgeon: Molli Hazard, MD;  Location: [place];  Service: Urology;  Laterality: N/A;  irrigation and debridment perineum    . IRRIGATION AND DEBRIDEMENT ABSCESS  08/14/2011   Procedure: MINOR  INCISION AND DRAINAGE OF ABSCESS;  Surgeon: Molli Hazard, MD;  Location: [place];  Service: Urology;  Laterality: N/A;  Perineal Wound Debridement;Placement Bilateral Testicular Thigh Pouches  . TOTAL HIP ARTHROPLASTY Left 10/11/2016   Procedure: LEFT TOTAL HIP ARTHROPLASTY ANTERIOR APPROACH;  Surgeon: Mcarthur Rossetti, MD;  [facility];  Service: Orthopedics;  Laterality: Left;    There were no vitals filed for this visit.  Subjective Assessment - 03/12/18 1413    Subjective  "I am having pins and needles pain in my back and pretty bad pain in my ankles. It started at the beginning of this week."    Currently in Pain?  Yes    Pain Score  3     Pain Location  Back    Pain Orientation  Right    Pain Descriptors / Indicators  -- pins and needles    Pain Type  Chronic pain    Pain Onset  More than a month ago    Pain Frequency  Intermittent    Aggravating Factors   walking and standing    Pain Relieving Factors  rest (sometimes)                       [facility] Adult PT Treatment/Exercise - 03/12/18 0001      Knee/Hip Exercises: Aerobic   Nustep  L2 x 5 min intermittent stopping due to pain  in thighs      Knee/Hip Exercises: Machines for Strengthening   Cybex Knee Extension  1x10 15 lbs    Cybex Knee Flexion  1x10 15lbs    Cybex Leg Press  2x10 20lbs with ball squeeze      Knee/Hip Exercises: Seated   Other Seated Knee/Hip Exercises  scaiatic nerve glides L x 5      Knee/Hip Exercises: Supine   Other Supine Knee/Hip Exercises  clamshells red theraband 2 x 10             PT Education - 03/12/18 1509    Education Details  diabetes management and peripheral neuropathy - pt encouraged to discuss with physician    Person(s) Educated  Patient    Methods  Explanation    Comprehension  Verbalized understanding       PT Short Term Goals - 03/03/18 9233      PT SHORT TERM GOAL #1   Title  pt to be I with inital HEP     Status  On-going      PT  SHORT TERM GOAL #2   Title  assess berg balance and LTG appropriately     Status  On-going        PT Long Term Goals - 03/03/18 0939      PT LONG TERM GOAL #1   Title  pt increase bil ankle DF to >/= 6 degrees to promote functoinal and efficient gait pattern to reduce catching foot on floor and reduce falls    Status  On-going      PT LONG TERM GOAL #2   Title  improve bil LE strength to >/= 4/5 to promote safety with standing/ walking activities and maximixe safety    Status  On-going      PT LONG TERM GOAL #3   Title  pt to be able to walk/ stand >/= 45 min with LRAD reporting no stiffness/ tightness for community distances    Status  Unable to assess      PT LONG TERM GOAL #4   Title  improve FOTO to </= 53% limited to demo improvement in function     Status  Unable to assess      PT LONG TERM GOAL #5   Title  pt to be I with all HEP given as of last visit maintain and progress current level of function    Status  On-going      PT LONG TERM GOAL #6   Title  Pt will increase BERG balance score to >/= 45/56 to decrease fall risk and improve standing balance with LRAD    Status  On-going            Plan - 03/12/18 1501    Clinical Impression Statement  Pt reporting increased pin and needles pain in legs and ankles. After some discussion, this is most likely due to his diabetes. He was instructed to speak to his physician about possible peripheral neuropathy. Treatment focused on strengthening of hip and knees. Pt able to tolerate strengthening on machines today.     PT Treatment/Interventions  ADLs/Self Care Home Management;Cryotherapy;Electrical Stimulation;Ultrasound;Moist Heat;Gait training;Stair training;Functional mobility training;Therapeutic activities;Therapeutic exercise;Manual techniques;Taping;Patient/family education;Passive range of motion;Balance training;Dry needling    PT Next Visit Plan  review/ update HEP PRN, calf & hamstring stretching/ soft tissue work.  hip/ knee/ ankle strengthening, muscle energy technique, balance, modalities prn, balance training    PT Home Exercise Plan  seated calf stretching, 4 way  ankle stretch, sit to stand, hamstring stretch, clamshells; added SLR and lower trunk rotations, bridge.     Consulted and Agree with Plan of Care  Patient       Patient will benefit from skilled therapeutic intervention in order to improve the following deficits and impairments:  Abnormal gait, Decreased activity tolerance, Decreased endurance, Improper body mechanics, Postural dysfunction, Decreased safety awareness, Decreased strength, Decreased range of motion, Increased fascial restricitons  Visit Diagnosis: Other abnormalities of gait and mobility  Unsteadiness on feet  Muscle weakness (generalized)     Problem List Patient Active Problem List   Diagnosis Date Noted  . Viral pharyngitis 01/14/2017  . Cigarette smoker 01/14/2017  . Elevated blood pressure reading 01/14/2017  . Avascular necrosis of hip, left ([place]) 10/11/2016  . Status post left hip replacement 10/11/2016  . Unilateral primary osteoarthritis, left hip 09/18/2016  . S/P transmetatarsal amputation of foot, left ([employer]) 07/25/2016  . Balance problem 07/25/2016  . Sepsis ([place]) 05/07/2016  . ARF (acute renal failure) ([place]) 05/07/2016  . Fournier's gangrene 08/16/2011  . Acute respiratory failure ([place]) 08/16/2011  . Septic shock(785.52) 08/16/2011  . Diabetic osteomyelitis ([place]) 08/16/2011   Worthy Flank, SPT 03/12/18 5:53 PM   [place] [facility] [address] [place], [place], [address] Phone: [phone]   Fax:  [phone]  Name: DOUGLAS SMOLINSKY MRN: [id number] Date of Birth: 1970-10-01

## 2018-03-13 ENCOUNTER — Ambulatory Visit: Payer: Self-pay | Admitting: Podiatry

## 2018-03-13 ENCOUNTER — Ambulatory Visit (INDEPENDENT_AMBULATORY_CARE_PROVIDER_SITE_OTHER): Payer: Self-pay | Admitting: Podiatry

## 2018-03-13 DIAGNOSIS — L84 Corns and callosities: Secondary | ICD-10-CM

## 2018-03-13 DIAGNOSIS — E1149 Type 2 diabetes mellitus with other diabetic neurological complication: Secondary | ICD-10-CM

## 2018-03-16 NOTE — Progress Notes (Signed)
Subjective: 46 year old male presents the office today for concerns of recurrent wound to the right foot.  He feels that the callus submetatarsal 5 has opened up.  Denies any drainage or pus coming from it at this time he states that he did notice some bloody drainage the other day.  Denies any increase in swelling or redness to his feet. Denies any systemic complaints such as fevers, chills, nausea, vomiting. No acute changes since last appointment, and no other complaints at this time.   Objective: AAO x3, NAD DP/PT pulses palpable bilaterally, CRT less than 3 seconds On the right foot submetatarsal 5 is a hyperkeratotic lesion.  Upon debridement there is some macerated tissue but there is no definitive open sore identified at this time however it is pre-ulcerative.  There is no surrounding erythema, ascending sialitis.  There is no fluctuation or crepitation or any malodor.  There is no clinical signs of infection noted today. No open lesions or pre-ulcerative lesions.  No pain with calf compression, swelling, warmth, erythema  Assessment: Pre-ulcerative area right submetatarsal 5  Plan: -All treatment options discussed with the patient including all alternatives, risks, complications.  -Lesion was sharply debrided without any complications.  The area is pre-ulcerative there is a small amount of macerated tissue in the callus.  Recommend Betadine dressing changes daily. Offloading at all times.  -Patient encouraged to call the office with any questions, concerns, change in symptoms.    Trula Slade DPM

## 2018-03-17 ENCOUNTER — Encounter: Payer: Self-pay | Admitting: Physical Therapy

## 2018-03-17 ENCOUNTER — Ambulatory Visit: Payer: Medicaid Other | Admitting: Physical Therapy

## 2018-03-17 DIAGNOSIS — R2689 Other abnormalities of gait and mobility: Secondary | ICD-10-CM

## 2018-03-17 DIAGNOSIS — R2681 Unsteadiness on feet: Secondary | ICD-10-CM

## 2018-03-17 DIAGNOSIS — M6281 Muscle weakness (generalized): Secondary | ICD-10-CM

## 2018-03-17 NOTE — Therapy (Signed)
[place], [place], [address] Phone: [phone]   Fax:  [phone]  Physical Therapy Treatment  Patient Details  Name: Edward [name] MRN: [id number] Date of Birth: 08-24-1970 Referring Provider: Jean Rosenthal   Encounter Date: 03/17/2018  PT End of Session - 03/17/18 1431    Visit Number  9    Number of Visits  19    Date for PT Re-Evaluation  04/14/18    Authorization Type  self-pay     PT Start Time  0218    PT Stop Time  0300    PT Time Calculation (min)  42 min       Past Medical History:  Diagnosis Date  . Anxiety    self reported  . Arthritis   . Depression    self reported  . Diabetes mellitus   . Hypertension     Past Surgical History:  Procedure Laterality Date  . AMPUTATION Left 05/10/2016   Procedure: Left Transmetatarsal Amputation;  Surgeon: [name], MD;  Location: [facility];  Service: Orthopedics;  Laterality: Left;  . CIRCUMCISION  09/02/2011   Procedure: CIRCUMCISION ADULT;  Surgeon: Molli Hazard, MD;  Location: [facility];  Service: Urology;  Laterality: N/A;  . I&D EXTREMITY  09/02/2011   Procedure: IRRIGATION AND DEBRIDEMENT EXTREMITY;  Surgeon: Theodoro Kos, DO;  Location: [facility];  Service: Plastics;  Laterality: N/A;  . INCISION AND DRAINAGE OF WOUND  08/12/2011   Procedure: IRRIGATION AND DEBRIDEMENT WOUND;  Surgeon: Zenovia Jarred, MD;  Location: [facility];  Service: General;;  perineum  . IRRIGATION AND DEBRIDEMENT ABSCESS  08/12/2011   Procedure: IRRIGATION AND DEBRIDEMENT ABSCESS;  Surgeon: Molli Hazard, MD;  Location: [facility];  Service: Urology;  Laterality: N/A;  irrigation and debridment perineum    . IRRIGATION AND DEBRIDEMENT ABSCESS  08/14/2011   Procedure: MINOR INCISION AND DRAINAGE OF ABSCESS;  Surgeon: Molli Hazard, MD;  Location: [facility];  Service: Urology;  Laterality: N/A;  Perineal Wound  Debridement;Placement Bilateral Testicular Thigh Pouches  . TOTAL HIP ARTHROPLASTY Left 10/11/2016   Procedure: LEFT TOTAL HIP ARTHROPLASTY ANTERIOR APPROACH;  Surgeon: Mcarthur Rossetti, MD;  Location: [facility];  Service: Orthopedics;  Laterality: Left;    There were no vitals filed for this visit.  Subjective Assessment - 03/17/18 1503    Currently in Pain?  Yes    Pain Score  3     Pain Location  Back    Pain Descriptors / Indicators  -- pins and needles     Pain Type  Chronic pain                       [facility] Adult PT Treatment/Exercise - 03/17/18 0001      Knee/Hip Exercises: Aerobic   Nustep  L5 x 6 min LE      Knee/Hip Exercises: Machines for Strengthening   Cybex Knee Extension  1x10 10 lbs    Cybex Knee Flexion  1x10 20lbs    Cybex Leg Press  2x10 20lbs with ball squeeze      Knee/Hip Exercises: Seated   Sit to Sand  1 set;10 reps 3 reps without UE, can lower without hands       Knee/Hip Exercises: Supine   Bridges  1 set;15 reps;Both;Strengthening with glute squeeze    Straight Leg Raises  2 sets;10 reps;Both;Strengthening verbal cues for breathing  Other Supine Knee/Hip Exercises  --      Knee/Hip Exercises: Sidelying   Clams  x 15 each                PT Short Term Goals - 03/03/18 7846      PT SHORT TERM GOAL #1   Title  pt to be I with inital HEP     Status  On-going      PT SHORT TERM GOAL #2   Title  assess berg balance and LTG appropriately     Status  On-going        PT Long Term Goals - 03/03/18 0939      PT LONG TERM GOAL #1   Title  pt increase bil ankle DF to >/= 6 degrees to promote functoinal and efficient gait pattern to reduce catching foot on floor and reduce falls    Status  On-going      PT LONG TERM GOAL #2   Title  improve bil LE strength to >/= 4/5 to promote safety with standing/ walking activities and maximixe safety    Status  On-going      PT LONG TERM GOAL #3   Title  pt to be able to walk/  stand >/= 45 min with LRAD reporting no stiffness/ tightness for community distances    Status  Unable to assess      PT LONG TERM GOAL #4   Title  improve FOTO to </= 53% limited to demo improvement in function     Status  Unable to assess      PT LONG TERM GOAL #5   Title  pt to be I with all HEP given as of last visit maintain and progress current level of function    Status  On-going      PT LONG TERM GOAL #6   Title  Pt will increase BERG balance score to >/= 45/56 to decrease fall risk and improve standing balance with LRAD    Status  On-going            Plan - 03/17/18 1427    Clinical Impression Statement  Pt reports he has not seen MD to discuss N/T in back and legs. He reports financial difficulty. Encouraged him to fill out hospital assistance application. Continued with LE strengthening as tolerated. C/O right dorsal foot /ankle N/T in supine and right sidelying.     PT Next Visit Plan  review/ update HEP PRN, calf & hamstring stretching/ soft tissue work. hip/ knee/ ankle strengthening, muscle energy technique, balance, modalities prn, balance training    PT Home Exercise Plan  seated calf stretching, 4 way ankle stretch, sit to stand, hamstring stretch, clamshells; added SLR and lower trunk rotations, bridge.     Consulted and Agree with Plan of Care  Patient       Patient will benefit from skilled therapeutic intervention in order to improve the following deficits and impairments:  Abnormal gait, Decreased activity tolerance, Decreased endurance, Improper body mechanics, Postural dysfunction, Decreased safety awareness, Decreased strength, Decreased range of motion, Increased fascial restricitons  Visit Diagnosis: Other abnormalities of gait and mobility  Unsteadiness on feet  Muscle weakness (generalized)     Problem List Patient Active Problem List   Diagnosis Date Noted  . Viral pharyngitis 01/14/2017  . Cigarette smoker 01/14/2017  . Elevated blood  pressure reading 01/14/2017  . Avascular necrosis of hip, left ([place]) 10/11/2016  . Status post left hip replacement 10/11/2016  .  Unilateral primary osteoarthritis, left hip 09/18/2016  . S/P transmetatarsal amputation of foot, left ([place]) 07/25/2016  . Balance problem 07/25/2016  . Sepsis ([place]) 05/07/2016  . ARF (acute renal failure) ([place]) 05/07/2016  . Fournier's gangrene 08/16/2011  . Acute respiratory failure ([place]) 08/16/2011  . Septic shock(785.52) 08/16/2011  . Diabetic osteomyelitis ([place]) 08/16/2011    Dorene Ar, PTA 03/17/2018, 3:03 PM  [facility] [address] [place], [place], [address] Phone: [phone]   Fax:  [phone]  Name: Edward [name] MRN: [id number] Date of Birth: 04/24/1971

## 2018-03-19 ENCOUNTER — Ambulatory Visit: Payer: Medicaid Other | Attending: Orthopaedic Surgery | Admitting: Physical Therapy

## 2018-03-19 ENCOUNTER — Encounter: Payer: Self-pay | Admitting: Physical Therapy

## 2018-03-19 DIAGNOSIS — R2689 Other abnormalities of gait and mobility: Secondary | ICD-10-CM | POA: Diagnosis present

## 2018-03-19 DIAGNOSIS — M6281 Muscle weakness (generalized): Secondary | ICD-10-CM | POA: Diagnosis present

## 2018-03-19 DIAGNOSIS — R2681 Unsteadiness on feet: Secondary | ICD-10-CM

## 2018-03-19 NOTE — Therapy (Signed)
[place], [place], [address] Phone: [phone]   Fax:  [phone]  Physical Therapy Treatment  Patient Details  Name: Edward [name] MRN: [id number] Date of Birth: [birth year] Referring Provider: Jean Rosenthal   Encounter Date: 03/19/2018  PT End of Session - 03/19/18 1416    Visit Number  10    Number of Visits  19    Date for PT Re-Evaluation  04/14/18    PT Start Time  8338    PT Stop Time  1458    PT Time Calculation (min)  41 min    Activity Tolerance  Patient tolerated treatment well    Behavior During Therapy  [facility] for tasks assessed/performed       Past Medical History:  Diagnosis Date  . Anxiety    self reported  . Arthritis   . Depression    self reported  . Diabetes mellitus   . Hypertension     Past Surgical History:  Procedure Laterality Date  . AMPUTATION Left 05/10/2016   Procedure: Left Transmetatarsal Amputation;  Surgeon: [name], MD;  Location: [facility];  Service: Orthopedics;  Laterality: Left;  . CIRCUMCISION  09/02/2011   Procedure: CIRCUMCISION ADULT;  Surgeon: Molli Hazard, MD;  Location: [facility];  Service: Urology;  Laterality: N/A;  . I&D EXTREMITY  09/02/2011   Procedure: IRRIGATION AND DEBRIDEMENT EXTREMITY;  Surgeon: Theodoro Kos, DO;  Location: [facility];  Service: Plastics;  Laterality: N/A;  . INCISION AND DRAINAGE OF WOUND  08/12/2011   Procedure: IRRIGATION AND DEBRIDEMENT WOUND;  Surgeon: Zenovia Jarred, MD;  Location: [facility];  Service: General;;  perineum  . IRRIGATION AND DEBRIDEMENT ABSCESS  08/12/2011   Procedure: IRRIGATION AND DEBRIDEMENT ABSCESS;  Surgeon: Molli Hazard, MD;  Location: [facility];  Service: Urology;  Laterality: N/A;  irrigation and debridment perineum    . IRRIGATION AND DEBRIDEMENT ABSCESS  08/14/2011   Procedure: MINOR INCISION AND DRAINAGE OF ABSCESS;  Surgeon: Molli Hazard, MD;  Location: [facility] [place];  Service: Urology;  Laterality: N/A;  Perineal Wound Debridement;Placement Bilateral Testicular Thigh Pouches  . TOTAL HIP ARTHROPLASTY Left 10/11/2016   Procedure: LEFT TOTAL HIP ARTHROPLASTY ANTERIOR APPROACH;  Surgeon: Mcarthur Rossetti, MD;  Location[facility];  Service: Orthopedics;  Laterality: Left;    There were no vitals filed for this visit.  Subjective Assessment - 03/19/18 1422    Subjective  " my back and my shoulders are bothering me today at about 5-6/10 today". pt reports he did have an incident at the grocery store and if he wasn't holding on the cart he would have fallen    Currently in Pain?  Yes    Pain Score  5     Pain Orientation  Right    Pain Type  Chronic pain    Pain Onset  More than a month ago    Pain Frequency  Intermittent    Aggravating Factors   walking and standing for long periods of time    Pain Relieving Factors  resting,     Effect of Pain on Daily Activities  limtied endurance/ standing/ walking    Pain Score  5    Pain Location  Neck    Pain Orientation  Posterior    Pain Descriptors / Indicators  Aching    Pain Type  Acute pain    Pain Onset  1 to 4 weeks ago  Pain Frequency  Constant    Aggravating Factors   using SPC and    Pain Relieving Factors  rolling shoulders                       [facility] Adult PT Treatment/Exercise - 03/19/18 1426      Knee/Hip Exercises: Aerobic   Nustep  L5 x 6 min LE only      Knee/Hip Exercises: Machines for Strengthening   Cybex Knee Extension  --    Cybex Knee Flexion  --    Cybex Leg Press  3x10 20lbs       Knee/Hip Exercises: Seated   Long Arc Quad  2 sets;10 reps;Both;Weights    Long Arc Quad Weight  4 lbs.      Knee/Hip Exercises: Supine   Straight Leg Raises  2 sets;Both;Strengthening;10 reps    Other Supine Knee/Hip Exercises  Clamshells blue theraband 2 x 15             PT Education - 03/19/18 1500    Education Details  updated HEP  for clams    Person(s) Educated  Patient    Methods  Explanation;Verbal cues;Handout    Comprehension  Verbalized understanding;Verbal cues required       PT Short Term Goals - 03/03/18 0175      PT SHORT TERM GOAL #1   Title  pt to be I with inital HEP     Status  On-going      PT SHORT TERM GOAL #2   Title  assess berg balance and LTG appropriately     Status  On-going        PT Long Term Goals - 03/03/18 0939      PT LONG TERM GOAL #1   Title  pt increase bil ankle DF to >/= 6 degrees to promote functoinal and efficient gait pattern to reduce catching foot on floor and reduce falls    Status  On-going      PT LONG TERM GOAL #2   Title  improve bil LE strength to >/= 4/5 to promote safety with standing/ walking activities and maximixe safety    Status  On-going      PT LONG TERM GOAL #3   Title  pt to be able to walk/ stand >/= 45 min with LRAD reporting no stiffness/ tightness for community distances    Status  Unable to assess      PT LONG TERM GOAL #4   Title  improve FOTO to </= 53% limited to demo improvement in function     Status  Unable to assess      PT LONG TERM GOAL #5   Title  pt to be I with all HEP given as of last visit maintain and progress current level of function    Status  On-going      PT LONG TERM GOAL #6   Title  Pt will increase BERG balance score to >/= 45/56 to decrease fall risk and improve standing balance with LRAD    Status  On-going            Plan - 03/19/18 1452    Clinical Impression Statement  pt continues to  report low back pain and progression of shoulder pain likely due to compensation strategies with his SPC. continued hip strength which he perofrmed well but continues to fatigue quickly. continued to discuss potential effects of peripheral neuropathy and benefits of getting his diabetes  under control. updated HEP for clamshells.     PT Treatment/Interventions  ADLs/Self Care Home Management;Cryotherapy;Electrical  Stimulation;Ultrasound;Moist Heat;Gait training;Stair training;Functional mobility training;Therapeutic activities;Therapeutic exercise;Manual techniques;Taping;Patient/family education;Passive range of motion;Balance training;Dry needling    PT Next Visit Plan  review/ update HEP PRN, calf & hamstring stretching/ soft tissue work. hip/ knee/ ankle strengthening, muscle energy technique, balance, modalities prn, balance training    PT Home Exercise Plan  seated calf stretching, 4 way ankle stretch, sit to stand, hamstring stretch, clamshells; added SLR and lower trunk rotations, bridge.     Consulted and Agree with Plan of Care  Patient       Patient will benefit from skilled therapeutic intervention in order to improve the following deficits and impairments:  Abnormal gait, Decreased activity tolerance, Decreased endurance, Improper body mechanics, Postural dysfunction, Decreased safety awareness, Decreased strength, Decreased range of motion, Increased fascial restricitons  Visit Diagnosis: Other abnormalities of gait and mobility  Unsteadiness on feet  Muscle weakness (generalized)     Problem List Patient Active Problem List   Diagnosis Date Noted  . Viral pharyngitis 01/14/2017  . Cigarette smoker 01/14/2017  . Elevated blood pressure reading 01/14/2017  . Avascular necrosis of hip, left ([place]) 10/11/2016  . Status post left hip replacement 10/11/2016  . Unilateral primary osteoarthritis, left hip 09/18/2016  . S/P transmetatarsal amputation of foot, left ([place]) 07/25/2016  . Balance problem 07/25/2016  . Sepsis ([place]) 05/07/2016  . ARF (acute renal failure) ([place]) 05/07/2016  . Fournier's gangrene 08/16/2011  . Acute respiratory failure ([place]) 08/16/2011  . Septic shock(785.52) 08/16/2011  . Diabetic osteomyelitis ([place]) 08/16/2011    Starr Lake PT, DPT, LAT, ATC  03/19/18  3:06 PM      [facility] [name] [place], [place], [address] Phone: [phone]   Fax:  [phone]  Name: Edward [name] MRN: [id number] Date of Birth: 1970-11-02

## 2018-03-24 ENCOUNTER — Ambulatory Visit: Payer: Medicaid Other | Admitting: Physical Therapy

## 2018-03-26 ENCOUNTER — Encounter: Payer: Medicaid Other | Admitting: Physical Therapy

## 2018-03-31 ENCOUNTER — Encounter: Payer: Self-pay | Admitting: Physical Therapy

## 2018-03-31 ENCOUNTER — Ambulatory Visit: Payer: Medicaid Other | Admitting: Physical Therapy

## 2018-03-31 VITALS — BP 98/78 | HR 104

## 2018-03-31 DIAGNOSIS — M6281 Muscle weakness (generalized): Secondary | ICD-10-CM

## 2018-03-31 DIAGNOSIS — R2689 Other abnormalities of gait and mobility: Secondary | ICD-10-CM

## 2018-03-31 DIAGNOSIS — R2681 Unsteadiness on feet: Secondary | ICD-10-CM

## 2018-03-31 NOTE — Therapy (Addendum)
[place], [place], [address] Phone: [phone]   Fax:  [phone]  Physical Therapy Treatment  Patient Details  Name: Edward [name] MRN: [id number] Date of Birth: 08-Dec-1970 Referring Provider: Jean Rosenthal   Encounter Date: 03/31/2018  PT End of Session - 03/31/18 1423    Visit Number  11    Number of Visits  19    Date for PT Re-Evaluation  04/14/18    Authorization Type  self-pay     PT Start Time  0215    PT Stop Time  0300    PT Time Calculation (min)  45 min       Past Medical History:  Diagnosis Date  . Anxiety    self reported  . Arthritis   . Depression    self reported  . Diabetes mellitus   . Hypertension     Past Surgical History:  Procedure Laterality Date  . AMPUTATION Left 05/10/2016   Procedure: Left Transmetatarsal Amputation;  Surgeon: [name], MD;  Location: [facility];  Service: Orthopedics;  Laterality: Left;  . CIRCUMCISION  09/02/2011   Procedure: CIRCUMCISION ADULT;  Surgeon: Molli Hazard, MD;  Location: [facility];  Service: Urology;  Laterality: N/A;  . I&D EXTREMITY  09/02/2011   Procedure: IRRIGATION AND DEBRIDEMENT EXTREMITY;  Surgeon: Theodoro Kos, DO;  Location: [facility];  Service: Plastics;  Laterality: N/A;  . INCISION AND DRAINAGE OF WOUND  08/12/2011   Procedure: IRRIGATION AND DEBRIDEMENT WOUND;  Surgeon: Zenovia Jarred, MD;  Location: [place];  Service: General;;  perineum  . IRRIGATION AND DEBRIDEMENT ABSCESS  08/12/2011   Procedure: IRRIGATION AND DEBRIDEMENT ABSCESS;  Surgeon: Molli Hazard, MD;  Location: [facility];  Service: Urology;  Laterality: N/A;  irrigation and debridment perineum    . IRRIGATION AND DEBRIDEMENT ABSCESS  08/14/2011   Procedure: MINOR INCISION AND DRAINAGE OF ABSCESS;  Surgeon: Molli Hazard, MD;  Location: [facility];  Service: Urology;  Laterality: N/A;  Perineal Wound  Debridement;Placement Bilateral Testicular Thigh Pouches  . TOTAL HIP ARTHROPLASTY Left 10/11/2016   Procedure: LEFT TOTAL HIP ARTHROPLASTY ANTERIOR APPROACH;  Surgeon: Mcarthur Rossetti, MD;  [facility];  Service: Orthopedics;  Laterality: Left;    Vitals:   03/31/18 1511  BP: 98/78  Pulse: (!) 104    Subjective Assessment - 03/31/18 1420    Subjective  Same old pain. Pins and needles in lower legs and calves.     Currently in Pain?  No/denies    Pain Score  5     Pain Location  Leg    Pain Orientation  Right;Left;Lower    Pain Descriptors / Indicators  Pins and needles    Aggravating Factors   laying on left side , otherwise random     Pain Relieving Factors  laying on right side                        [facility] Adult PT Treatment/Exercise - 03/31/18 0001      Knee/Hip Exercises: Aerobic   Nustep  L5 x 6 min LE only      Knee/Hip Exercises: Machines for Strengthening   Cybex Knee Extension  10# x 5 reps 5# x 5 reps c/o N/T in legs     Cybex Knee Flexion  25 lbs x 20    Cybex Leg Press  2 x 10 40lbs , adjusted  seat to smaller ROM       Knee/Hip Exercises: Standing   Other Standing Knee Exercises  hip flexion x 10 each       Knee/Hip Exercises: Supine   Bridges  1 set;Both;Strengthening;10 reps   with glute squeeze   Straight Leg Raises  Both;Strengthening;10 reps;1 set    Other Supine Knee/Hip Exercises  Clamshells blue theraband 2 x 15          Balance Exercises - 03/31/18 1450      Balance Exercises: Standing   Standing Eyes Closed  3 reps   staggered stand trials and tandem   SLS  Upper extremity support 1;Eyes open;3 reps    Sidestepping  Upper extremity support          PT Short Term Goals - 03/03/18 6659      PT SHORT TERM GOAL #1   Title  pt to be I with inital HEP     Status  On-going      PT SHORT TERM GOAL #2   Title  assess berg balance and LTG appropriately     Status  On-going        PT Long Term Goals -  03/03/18 0939      PT LONG TERM GOAL #1   Title  pt increase bil ankle DF to >/= 6 degrees to promote functoinal and efficient gait pattern to reduce catching foot on floor and reduce falls    Status  On-going      PT LONG TERM GOAL #2   Title  improve bil LE strength to >/= 4/5 to promote safety with standing/ walking activities and maximixe safety    Status  On-going      PT LONG TERM GOAL #3   Title  pt to be able to walk/ stand >/= 45 min with LRAD reporting no stiffness/ tightness for community distances    Status  Unable to assess      PT LONG TERM GOAL #4   Title  improve FOTO to </= 53% limited to demo improvement in function     Status  Unable to assess      PT LONG TERM GOAL #5   Title  pt to be I with all HEP given as of last visit maintain and progress current level of function    Status  On-going      PT LONG TERM GOAL #6   Title  Pt will increase BERG balance score to >/= 45/56 to decrease fall risk and improve standing balance with LRAD    Status  On-going            Plan - 03/31/18 1452    Clinical Impression Statement  Pt reports shoulder stiffness and intermittent LBP. Continues with pins and needles in lower legs and feet that is worse at night. He has not made appt with MD due to financial reasons. I reminded him of the application for heath care financial assist which he reports he has started but not completed. Continued with general LE strength and balance. Will check LTGs with PT next visit. Pt c/o more fatigue today than usual and light headedness. BP 98/78, pulse 104 bpm. After a few minutes lightheadedness resolved. Encouraged him to go to ED if he has chest pain or SOB. He agreed.     PT Next Visit Plan  check goals     PT Home Exercise Plan  seated calf stretching, 4 way ankle stretch, sit to stand,  hamstring stretch, clamshells; added SLR and lower trunk rotations, bridge.     Consulted and Agree with Plan of Care  Patient       Patient will  benefit from skilled therapeutic intervention in order to improve the following deficits and impairments:  Abnormal gait, Decreased activity tolerance, Decreased endurance, Improper body mechanics, Postural dysfunction, Decreased safety awareness, Decreased strength, Decreased range of motion, Increased fascial restricitons  Visit Diagnosis: Other abnormalities of gait and mobility  Unsteadiness on feet  Muscle weakness (generalized)     Problem List Patient Active Problem List   Diagnosis Date Noted  . Viral pharyngitis 01/14/2017  . Cigarette smoker 01/14/2017  . Elevated blood pressure reading 01/14/2017  . Avascular necrosis of hip, left ([place]) 10/11/2016  . Status post left hip replacement 10/11/2016  . Unilateral primary osteoarthritis, left hip 09/18/2016  . S/P transmetatarsal amputation of foot, left ([place]) 07/25/2016  . Balance problem 07/25/2016  . Sepsis ([place]) 05/07/2016  . ARF (acute renal failure) ([place]) 05/07/2016  . Fournier's gangrene 08/16/2011  . Acute respiratory failure ([place]) 08/16/2011  . Septic shock(785.52) 08/16/2011  . Diabetic osteomyelitis ([place]) 08/16/2011    Dorene Ar, PTA 03/31/2018, 3:12 PM  [facility] [address] [place], [place], [address] Phone: [phone]   Fax:  [phone]  Name: Edward [name] MRN: [id number] Date of Birth: 04-24-1971

## 2018-04-02 ENCOUNTER — Ambulatory Visit: Payer: Medicaid Other | Admitting: Physical Therapy

## 2018-04-02 ENCOUNTER — Encounter: Payer: Self-pay | Admitting: Physical Therapy

## 2018-04-02 DIAGNOSIS — R2689 Other abnormalities of gait and mobility: Secondary | ICD-10-CM

## 2018-04-02 DIAGNOSIS — R2681 Unsteadiness on feet: Secondary | ICD-10-CM

## 2018-04-02 DIAGNOSIS — M6281 Muscle weakness (generalized): Secondary | ICD-10-CM

## 2018-04-02 NOTE — Therapy (Signed)
[place], [place], [address] Phone: [phone]   Fax:  [phone]  Physical Therapy Treatment  Patient Details  Name: Edward [name] MRN: [id number] Date of Birth: [birth year] Referring Provider: Jean Rosenthal   Encounter Date: 04/02/2018  PT End of Session - 04/02/18 1423    Visit Number  12    Number of Visits  19    Date for PT Re-Evaluation  04/14/18    Authorization Type  self-pay     PT Start Time  1419    PT Stop Time  1500    PT Time Calculation (min)  41 min    Activity Tolerance  Patient tolerated treatment well    Behavior During Therapy  [facility] for tasks assessed/performed       Past Medical History:  Diagnosis Date  . Anxiety    self reported  . Arthritis   . Depression    self reported  . Diabetes mellitus   . Hypertension     Past Surgical History:  Procedure Laterality Date  . AMPUTATION Left 05/10/2016   Procedure: Left Transmetatarsal Amputation;  Surgeon: [name], MD;  Location: [place];  Service: Orthopedics;  Laterality: Left;  . CIRCUMCISION  09/02/2011   Procedure: CIRCUMCISION ADULT;  Surgeon: Molli Hazard, MD;  Location: [place];  Service: Urology;  Laterality: N/A;  . I&D EXTREMITY  09/02/2011   Procedure: IRRIGATION AND DEBRIDEMENT EXTREMITY;  Surgeon: Theodoro Kos, DO;  Location: [place];  Service: Plastics;  Laterality: N/A;  . INCISION AND DRAINAGE OF WOUND  08/12/2011   Procedure: IRRIGATION AND DEBRIDEMENT WOUND;  Surgeon: Zenovia Jarred, MD;  Location: [place];  Service: General;;  perineum  . IRRIGATION AND DEBRIDEMENT ABSCESS  08/12/2011   Procedure: IRRIGATION AND DEBRIDEMENT ABSCESS;  Surgeon: Molli Hazard, MD;  Location: [place];  Service: Urology;  Laterality: N/A;  irrigation and debridment perineum    . IRRIGATION AND DEBRIDEMENT ABSCESS  08/14/2011   Procedure: MINOR INCISION AND DRAINAGE OF  ABSCESS;  Surgeon: Molli Hazard, MD;  Location: [place];  Service: Urology;  Laterality: N/A;  Perineal Wound Debridement;Placement Bilateral Testicular Thigh Pouches  . TOTAL HIP ARTHROPLASTY Left 10/11/2016   Procedure: LEFT TOTAL HIP ARTHROPLASTY ANTERIOR APPROACH;  Surgeon: Mcarthur Rossetti, MD;  Location: [facility];  Service: Orthopedics;  Laterality: Left;    There were no vitals filed for this visit.  Subjective Assessment - 04/02/18 1422    Subjective  "I feel about the same today."    Currently in Pain?  Yes    Pain Score  6     Pain Location  Leg    Pain Orientation  Right;Left;Lower    Pain Descriptors / Indicators  --   pins and needles; tightness   Pain Type  Chronic pain    Pain Onset  More than a month ago    Pain Frequency  Intermittent         [facility] PT Assessment - 04/02/18 0001      Observation/Other Assessments   Focus on Therapeutic Outcomes (FOTO)   74% limited      AROM   Right Ankle Dorsiflexion  0    Left Ankle Dorsiflexion  -3      Strength   Right Hip Flexion  3+/5    Right Hip Extension  3/5    Right Hip ABduction  4/5    Left Hip Flexion  3/5    Left Hip Extension  3/5    Left Hip ABduction  3+/5                   [facility] Adult PT Treatment/Exercise - 04/02/18 0001      Knee/Hip Exercises: Aerobic   Nustep  L4-5 x 6 min      Knee/Hip Exercises: Machines for Strengthening   Cybex Leg Press  2x10-15 40lbs    moved seat up to decrease ROM     Knee/Hip Exercises: Supine   Bridges  1 set;10 reps   with ball squeeze   Other Supine Knee/Hip Exercises  ankle DF bilaterally 2 x 15   second set with red band.    Other Supine Knee/Hip Exercises  clamshells blue theraband x 10              PT Education - 04/02/18 1511    Education Details  HEP review; importance of certain exercises such as resisted ankle DF to prevent catching foot when walking    Person(s) Educated  Patient    Methods  Explanation     Comprehension  Verbalized understanding       PT Short Term Goals - 04/02/18 1444      PT SHORT TERM GOAL #1   Title  pt to be I with inital HEP     Status  Achieved      PT SHORT TERM GOAL #2   Title  assess berg balance and LTG appropriately     Status  Achieved        PT Long Term Goals - 04/02/18 1433      PT LONG TERM GOAL #1   Title  pt increase bil ankle DF to >/= 6 degrees to promote functoinal and efficient gait pattern to reduce catching foot on floor and reduce falls    Baseline  R ankle 0, L ankle -3    Status  On-going      PT LONG TERM GOAL #2   Title  improve bil LE strength to >/= 4/5 to promote safety with standing/ walking activities and maximixe safety    Baseline  Hip flex 3/5 L, 3+/5 R, L hip abd 3+, Bil hip extension 3/5, R hip abduction 4/5    Status  On-going      PT LONG TERM GOAL #3   Title  pt to be able to walk/ stand >/= 45 min with LRAD reporting no stiffness/ tightness for community distances    Baseline  walk/standing 10-15 minutes    Status  On-going      PT LONG TERM GOAL #4   Title  improve FOTO to </= 53% limited to demo improvement in function     Baseline  FOTO 74% limited    Status  On-going      PT LONG TERM GOAL #5   Title  pt to be I with all HEP given as of last visit maintain and progress current level of function    Status  On-going      PT LONG TERM GOAL #6   Title  Pt will increase BERG balance score to >/= 45/56 to decrease fall risk and improve standing balance with LRAD    Status  Unable to assess            Plan - 04/02/18 1503    Clinical Impression Statement  Reassessed pt's goals today. Minimal progress made. Pt still lacking strength in bilateral LEs  and adequate bilateral dorsiflexion range of motion. He fatigues quickly during session. FOTO score still 74% limited. Treatment focused on continued strengthening of hips, knees, and ankles. Pt reports he cannot feel his L foot during ankle DF exercise. When he  walks, he states he can feel the floor with his R foot but it feels like he is walking on air with his L foot.     PT Treatment/Interventions  ADLs/Self Care Home Management;Cryotherapy;Electrical Stimulation;Ultrasound;Moist Heat;Gait training;Stair training;Functional mobility training;Therapeutic activities;Therapeutic exercise;Manual techniques;Taping;Patient/family education;Passive range of motion;Balance training;Dry needling    PT Next Visit Plan  Continued hip, knee, and ankle strengthening, balance    PT Home Exercise Plan  seated calf stretching, 4 way ankle stretch, sit to stand, hamstring stretch, clamshells; added SLR and lower trunk rotations, bridge.     Consulted and Agree with Plan of Care  Patient       Patient will benefit from skilled therapeutic intervention in order to improve the following deficits and impairments:  Abnormal gait, Decreased activity tolerance, Decreased endurance, Improper body mechanics, Postural dysfunction, Decreased safety awareness, Decreased strength, Decreased range of motion, Increased fascial restricitons  Visit Diagnosis: No diagnosis found.     Problem List Patient Active Problem List   Diagnosis Date Noted  . Viral pharyngitis 01/14/2017  . Cigarette smoker 01/14/2017  . Elevated blood pressure reading 01/14/2017  . Avascular necrosis of hip, left ([place]) 10/11/2016  . Status post left hip replacement 10/11/2016  . Unilateral primary osteoarthritis, left hip 09/18/2016  . S/P transmetatarsal amputation of foot, left ([place]) 07/25/2016  . Balance problem 07/25/2016  . Sepsis ([place]) 05/07/2016  . ARF (acute renal failure) ([place]) 05/07/2016  . Fournier's gangrene 08/16/2011  . Acute respiratory failure ([place]) 08/16/2011  . Septic shock(785.52) 08/16/2011  . Diabetic osteomyelitis ([place]) 08/16/2011   Worthy Flank, SPT 04/02/18 3:12 PM   [facility] [address] [place], [place],  [address] Phone: [phone]   Fax:  [phone]  Name: Edward [name] MRN: [id number] Date of Birth: 1970/12/07

## 2018-04-03 ENCOUNTER — Ambulatory Visit: Payer: Self-pay | Admitting: Podiatry

## 2018-04-07 ENCOUNTER — Ambulatory Visit: Payer: Medicaid Other | Admitting: Physical Therapy

## 2018-04-09 ENCOUNTER — Ambulatory Visit: Payer: Medicaid Other | Admitting: Physical Therapy

## 2018-04-14 ENCOUNTER — Ambulatory Visit: Payer: Medicaid Other | Admitting: Physical Therapy

## 2018-04-16 ENCOUNTER — Encounter: Payer: Self-pay | Admitting: Physical Therapy

## 2018-04-16 ENCOUNTER — Ambulatory Visit: Payer: Medicaid Other | Admitting: Physical Therapy

## 2018-04-16 DIAGNOSIS — M6281 Muscle weakness (generalized): Secondary | ICD-10-CM

## 2018-04-16 DIAGNOSIS — R2689 Other abnormalities of gait and mobility: Secondary | ICD-10-CM | POA: Diagnosis not present

## 2018-04-16 DIAGNOSIS — R2681 Unsteadiness on feet: Secondary | ICD-10-CM

## 2018-04-16 NOTE — Therapy (Signed)
[place], [place], [address] Phone: [phone]   Fax:  [phone]  Physical Therapy Treatment / Discharge Summary  Patient Details  Name: Edward [name] MRN: [id number] Date of Birth: 01-27-1971 Referring Provider: Jean Rosenthal   Encounter Date: 04/16/2018  PT End of Session - 04/16/18 1415    Visit Number  13    Number of Visits  19    Date for PT Re-Evaluation  04/16/18    Authorization Type  self-pay     PT Start Time  1415    PT Stop Time  1505    PT Time Calculation (min)  50 min    Activity Tolerance  Patient tolerated treatment well    Behavior During Therapy  [facility] for tasks assessed/performed       Past Medical History:  Diagnosis Date  . Anxiety    self reported  . Arthritis   . Depression    self reported  . Diabetes mellitus   . Hypertension     Past Surgical History:  Procedure Laterality Date  . AMPUTATION Left 05/10/2016   Procedure: Left Transmetatarsal Amputation;  Surgeon: [name], MD;  Location: [place];  Service: Orthopedics;  Laterality: Left;  . CIRCUMCISION  09/02/2011   Procedure: CIRCUMCISION ADULT;  Surgeon: Molli Hazard, MD;  Location: [place];  Service: Urology;  Laterality: N/A;  . I&D EXTREMITY  09/02/2011   Procedure: IRRIGATION AND DEBRIDEMENT EXTREMITY;  Surgeon: Theodoro Kos, DO;  Location: [place];  Service: Plastics;  Laterality: N/A;  . INCISION AND DRAINAGE OF WOUND  08/12/2011   Procedure: IRRIGATION AND DEBRIDEMENT WOUND;  Surgeon: Zenovia Jarred, MD;  Location: [place];  Service: General;;  perineum  . IRRIGATION AND DEBRIDEMENT ABSCESS  08/12/2011   Procedure: IRRIGATION AND DEBRIDEMENT ABSCESS;  Surgeon: Molli Hazard, MD;  Location: [place];  Service: Urology;  Laterality: N/A;  irrigation and debridment perineum    . IRRIGATION AND DEBRIDEMENT ABSCESS  08/14/2011   Procedure: MINOR  INCISION AND DRAINAGE OF ABSCESS;  Surgeon: Molli Hazard, MD;  Location: [place];  Service: Urology;  Laterality: N/A;  Perineal Wound Debridement;Placement Bilateral Testicular Thigh Pouches  . TOTAL HIP ARTHROPLASTY Left 10/11/2016   Procedure: LEFT TOTAL HIP ARTHROPLASTY ANTERIOR APPROACH;  Surgeon: Mcarthur Rossetti, MD;  Location: [facility];  Service: Orthopedics;  Laterality: Left;    There were no vitals filed for this visit.  Subjective Assessment - 04/16/18 1415    Subjective  "I fell the other day getting out of bed and I skinned my knee"     Currently in Pain?  Yes    Pain Score  9     Pain Location  Leg    Pain Orientation  Right;Left;Lower    Pain Descriptors / Indicators  Tightness    Pain Type  Chronic pain    Pain Onset  More than a month ago    Pain Frequency  Intermittent    Aggravating Factors   laying on left side, walking/ standing    Pain Relieving Factors  laying down, getting off feet.          [facility] PT Assessment - 04/16/18 1424      Observation/Other Assessments   Focus on Therapeutic Outcomes (FOTO)   74% limited      AROM   Right Ankle Dorsiflexion  -4    Left Ankle Dorsiflexion  -6  PROM   Right Ankle Dorsiflexion  4   reported sharp pain end range anterior/ posterior aspect ank   Left Ankle Dorsiflexion  3      Strength   Right Hip Flexion  3+/5    Right Hip Extension  3/5    Right Hip ABduction  4-/5    Right Hip ADduction  4-/5    Left Hip Flexion  3/5    Left Hip Extension  3/5    Left Hip ABduction  3+/5    Right Ankle Dorsiflexion  3+/5   pain during testing   Right Ankle Plantar Flexion  4-/5    Left Ankle Dorsiflexion  4-/5    Left Ankle Plantar Flexion  4-/5      Berg Balance Test   Sit to Stand  Able to stand  independently using hands    Standing Unsupported  Able to stand safely 2 minutes    Sitting with Back Unsupported but Feet Supported on Floor or Stool  Able to sit safely and securely 2 minutes    Stand  to Sit  Controls descent by using hands    Transfers  Able to transfer safely, definite need of hands    Standing Unsupported with Eyes Closed  Able to stand 10 seconds safely    Standing Ubsupported with Feet Together  Able to place feet together independently and stand for 1 minute with supervision    From Standing, Reach Forward with Outstretched Arm  Can reach forward >12 cm safely (5")    From Standing Position, Pick up Object from Floor  Able to pick up shoe, needs supervision    From Standing Position, Turn to Look Behind Over each Shoulder  Turn sideways only but maintains balance    Turn 360 Degrees  Needs close supervision or verbal cueing    Standing Unsupported, Alternately Place Feet on Step/Stool  Needs assistance to keep from falling or unable to try    Standing Unsupported, One Foot in [employer] balance while stepping or standing    Standing on One Leg  Unable to try or needs assist to prevent fall    Total Score  33                   [facility] Adult PT Treatment/Exercise - 04/16/18 0001      Knee/Hip Exercises: Stretches   Other Knee/Hip Stretches  seated DF stretch with towel bilateral 2 x 30 sec      Knee/Hip Exercises: Aerobic   Nustep  L4 x 6 min LE only      Knee/Hip Exercises: Seated   Other Seated Knee/Hip Exercises  seated heel raise/ toe raise 2 x 10      Knee/Hip Exercises: Supine   Other Supine Knee/Hip Exercises  ankle DF bilaterally 2 x 15      Modalities   Modalities  Moist Heat      Moist Heat Therapy   Number Minutes Moist Heat  10 Minutes    Moist Heat Location  Ankle   bil calfs in supine            PT Education - 04/16/18 1452    Education Details  reviewed previously provided HEP and updated for stretching  and discussed benefits of continued exercise and stretching     Person(s) Educated  Patient    Methods  Explanation;Verbal cues;Handout    Comprehension  Verbalized understanding;Verbal cues required       PT Short  Term Goals - 04/02/18 1444      PT SHORT TERM GOAL #1   Title  pt to be I with inital HEP     Status  Achieved      PT SHORT TERM GOAL #2   Title  assess berg balance and LTG appropriately     Status  Achieved        PT Long Term Goals - 04/16/18 1429      PT LONG TERM GOAL #1   Title  pt increase bil ankle DF to >/= 6 degrees to promote functoinal and efficient gait pattern to reduce catching foot on floor and reduce falls    Time  6    Period  Weeks    Status  Not Met      PT LONG TERM GOAL #2   Title  improve bil LE strength to >/= 4/5 to promote safety with standing/ walking activities and maximixe safety    Time  6    Period  Weeks    Status  Not Met      PT LONG TERM GOAL #3   Title  pt to be able to walk/ stand >/= 45 min with LRAD reporting no stiffness/ tightness for community distances    Baseline  walk/standing 5 minutes    Time  6    Period  Weeks    Status  Not Met      PT LONG TERM GOAL #4   Title  improve FOTO to </= 53% limited to demo improvement in function     Baseline  FOTO 74% limited    Time  6    Period  Weeks    Status  Not Met      PT LONG TERM GOAL #5   Title  pt to be I with all HEP given as of last visit maintain and progress current level of function    Time  6    Period  Weeks    Status  Partially Met      PT LONG TERM GOAL #6   Title  Pt will increase BERG balance score to >/= 45/56 to decrease fall risk and improve standing balance with LRAD    Baseline  34/56    Time  6    Period  Weeks    Status  Not Met            Plan - 04/16/18 1453    Clinical Impression Statement  pt reports continued pain today and reports having a fall with increased pain when he was getting out of bed a week ago. He continues to demonstrate limited ankle mobility and hip/ ankle strength. FOTO assessment demonstrates no progress and he has made no active progress toward LTG's. based on no functional progression toward goals plan to discharge back  to his referring provider and discharge from physical therapy today.     PT Treatment/Interventions  ADLs/Self Care Home Management;Cryotherapy;Electrical Stimulation;Ultrasound;Moist Heat;Gait training;Stair training;Functional mobility training;Therapeutic activities;Therapeutic exercise;Manual techniques;Taping;Patient/family education;Passive range of motion;Balance training;Dry needling    PT Next Visit Plan  d/C    PT Home Exercise Plan  seated calf stretching, 4 way ankle stretch, sit to stand, hamstring stretch, clamshells; added SLR and lower trunk rotations, bridge. seated heel raise/ toe raise    Consulted and Agree with Plan of Care  Patient       Patient will benefit from skilled therapeutic intervention in order to improve the following deficits and impairments:  Abnormal gait, Decreased activity tolerance, Decreased endurance, Improper body mechanics, Postural dysfunction, Decreased safety awareness, Decreased strength, Decreased range of motion, Increased fascial restricitons  Visit Diagnosis: Other abnormalities of gait and mobility  Unsteadiness on feet  Muscle weakness (generalized)     Problem List Patient Active Problem List   Diagnosis Date Noted  . Viral pharyngitis 01/14/2017  . Cigarette smoker 01/14/2017  . Elevated blood pressure reading 01/14/2017  . Avascular necrosis of hip, left ([place]) 10/11/2016  . Status post left hip replacement 10/11/2016  . Unilateral primary osteoarthritis, left hip 09/18/2016  . S/P transmetatarsal amputation of foot, left ([place]) 07/25/2016  . Balance problem 07/25/2016  . Sepsis ([place]) 05/07/2016  . ARF (acute renal failure) ([place]) 05/07/2016  . Fournier's gangrene 08/16/2011  . Acute respiratory failure ([place]) 08/16/2011  . Septic shock(785.52) 08/16/2011  . Diabetic osteomyelitis ([place]) 08/16/2011   Starr Lake PT, DPT, LAT, ATC  04/16/18  2:57 PM      [facility] [address] [place], [place], [address] Phone: [phone]   Fax:  [phone]  Name: Edward [name] MRN: [id number] Date of Birth: [birth year]        PHYSICAL THERAPY DISCHARGE SUMMARY  Visits from Start of Care: 13  Current functional level related to goals / functional outcomes: See goals, FOTO 74% limited   Remaining deficits: Limited ankle mobility and bil LE weakness. Limited endurance with walking/ standing, frequent falling and instability. See above assessment   Education / Equipment: HEP, theraband, posture, and taking it slow to prevent falling.   Plan: Patient agrees to discharge.  Patient goals were not met. Patient is being discharged due to lack of progress.  ?????         Timtohy Broski PT, DPT, LAT, ATC  04/16/18  3:07 PM

## 2018-05-07 DIAGNOSIS — M79676 Pain in unspecified toe(s): Secondary | ICD-10-CM

## 2018-05-09 ENCOUNTER — Encounter (HOSPITAL_COMMUNITY): Payer: Self-pay | Admitting: Emergency Medicine

## 2018-05-09 ENCOUNTER — Ambulatory Visit (HOSPITAL_COMMUNITY)
Admission: EM | Admit: 2018-05-09 | Discharge: 2018-05-09 | Disposition: A | Discharge status: Home / Self Care | Payer: Medicaid Other | Attending: Family Medicine | Admitting: Family Medicine

## 2018-05-09 DIAGNOSIS — Z89422 Acquired absence of other left toe(s): Secondary | ICD-10-CM

## 2018-05-09 DIAGNOSIS — E1140 Type 2 diabetes mellitus with diabetic neuropathy, unspecified: Secondary | ICD-10-CM

## 2018-05-09 DIAGNOSIS — E11621 Type 2 diabetes mellitus with foot ulcer: Secondary | ICD-10-CM

## 2018-05-09 DIAGNOSIS — L97411 Non-pressure chronic ulcer of right heel and midfoot limited to breakdown of skin: Secondary | ICD-10-CM

## 2018-05-09 MED ORDER — MUPIROCIN 2 % EX OINT: 1.0000 "application " | TOPICAL_OINTMENT | Freq: Three times a day (TID) | CUTANEOUS | 1 refills | Status: DC

## 2018-05-09 NOTE — ED Provider Notes (Signed)
[name]    CSN: [id number] Arrival date & time: 05/09/18  1638     History   Chief Complaint Chief Complaint  Patient presents with  . Wound Check    HPI Edward [name] is a 47 y.o. male.   This is a 36 year old diabetic man who comes in with a 1 year history of right foot ulcer.  He was denied in office visit by his primary care doctor because he could not come up with $200.  He is applying for Medicaid.  Patient has had debridement on several occasions.  Is currently using Silvadene cream.  Patient has had amputations of his left foot (trans-met).  He has had vascular studies that have shown good large vessel blood supply but poor capillary blood flow.  Patient has neuropathy so the ulcer is not particularly painful, but it has quite a powerful odor.     Past Medical History:  Diagnosis Date  . Anxiety    self reported  . Arthritis   . Depression    self reported  . Diabetes mellitus   . Hypertension     Patient Active Problem List   Diagnosis Date Noted  . Viral pharyngitis 01/14/2017  . Cigarette smoker 01/14/2017  . Elevated blood pressure reading 01/14/2017  . Avascular necrosis of hip, left ([place]) 10/11/2016  . Status post left hip replacement 10/11/2016  . Unilateral primary osteoarthritis, left hip 09/18/2016  . S/P transmetatarsal amputation of foot, left ([place]) 07/25/2016  . Balance problem 07/25/2016  . Sepsis ([place]) 05/07/2016  . ARF (acute renal failure) ([place]) 05/07/2016  . Fournier's gangrene 08/16/2011  . Acute respiratory failure ([place]) 08/16/2011  . Septic shock(785.52) 08/16/2011  . Diabetic osteomyelitis ([place]) 08/16/2011    Past Surgical History:  Procedure Laterality Date  . AMPUTATION Left 05/10/2016   Procedure: Left Transmetatarsal Amputation;  Surgeon: [name], MD;  Location: [facility];  Service: Orthopedics;  Laterality: Left;  . CIRCUMCISION  09/02/2011   Procedure: CIRCUMCISION ADULT;  Surgeon: Molli Hazard, MD;  Location: [facility];  Service: Urology;  Laterality: N/A;  . I&D EXTREMITY  09/02/2011   Procedure: IRRIGATION AND DEBRIDEMENT EXTREMITY;  Surgeon: Theodoro Kos, DO;  Location: [facility];  Service: Plastics;  Laterality: N/A;  . INCISION AND DRAINAGE OF WOUND  08/12/2011   Procedure: IRRIGATION AND DEBRIDEMENT WOUND;  Surgeon: Zenovia Jarred, MD;  Location: [facility];  Service: General;;  perineum  . IRRIGATION AND DEBRIDEMENT ABSCESS  08/12/2011   Procedure: IRRIGATION AND DEBRIDEMENT ABSCESS;  Surgeon: Molli Hazard, MD;  Location: [facility];  Service: Urology;  Laterality: N/A;  irrigation and debridment perineum    . IRRIGATION AND DEBRIDEMENT ABSCESS  08/14/2011   Procedure: MINOR INCISION AND DRAINAGE OF ABSCESS;  Surgeon: Molli Hazard, MD;  Location: [facility];  Service: Urology;  Laterality: N/A;  Perineal Wound Debridement;Placement Bilateral Testicular Thigh Pouches  . TOTAL HIP ARTHROPLASTY Left 10/11/2016   Procedure: LEFT TOTAL HIP ARTHROPLASTY ANTERIOR APPROACH;  Surgeon: Mcarthur Rossetti, MD;  [facility];  Service: Orthopedics;  Laterality: Left;       Home Medications    Prior to Admission medications   Medication Sig Start Date End Date Taking? Authorizing Provider  aspirin 81 MG chewable tablet CHEW 1 T PO BID 10/14/16   [provider]  blood glucose meter kit and supplies Dispense based on patient and insurance preference. Use up to four times daily as directed. (  FOR ICD-9 250.00, 250.01). 05/13/16   Barton Dubois, MD  Insulin Glargine (LANTUS) 100 UNIT/ML Solostar Pen Inject 40 Units into the skin daily at 10 pm. Patient taking differently: Inject 45 Units into the skin daily at 10 pm.  05/13/16   Barton Dubois, MD  Insulin Pen Needle 31G X 5 MM MISC Use daily to inject insulin as instructed 05/13/16   Barton Dubois, MD  ketoconazole (NIZORAL) 2 % cream Apply 1 application topically 2 (two) times  daily. 12/11/16   Robyn Haber, MD  magic mouthwash w/lidocaine SOLN Take 5 mLs by mouth 3 (three) times daily as needed for mouth pain. 5/32/99   Defelice, Jeanett Schlein, NP  methocarbamol (ROBAXIN) 500 MG tablet TK 1 T PO Q 6 H PRN FOR MUSCLE SPASMS 10/14/16   [provider]  mupirocin ointment (BACTROBAN) 2 % Apply 1 application topically 3 (three) times daily. 05/09/18   Robyn Haber, MD  silver sulfADIAZINE (SILVADENE) 1 % cream Apply 1 application topically daily. 01/17/17   Trula Slade, DPM  silver sulfADIAZINE (SILVADENE) 1 % cream Apply 1 application topically daily. 07/15/17   Trula Slade, DPM  sitaGLIPtin-metformin (JANUMET) 50-1000 MG tablet Take 1 tablet by mouth 2 (two) times daily with a meal.    [provider]  tiZANidine (ZANAFLEX) 4 MG tablet Take 1 tablet (4 mg total) by mouth every 8 (eight) hours as needed for muscle spasms. 02/17/18   Mcarthur Rossetti, MD  traMADol (ULTRAM) 50 MG tablet Take 1 tablet (50 mg total) by mouth every 6 (six) hours as needed. 02/08/18   Robyn Haber, MD    Family History Family History  Problem Relation Age of Onset  . Diabetes Other     Social History Social History   Tobacco Use  . Smoking status: Current Every Day Smoker    Types: Cigarettes  . Smokeless tobacco: Never Used  Substance Use Topics  . Alcohol use: Yes    Alcohol/week: 0.0 standard drinks    Comment: has not had alcohol in a month  . Drug use: No     Allergies   Patient has no known allergies.   Review of Systems Review of Systems   Physical Exam Triage Vital Signs ED Triage Vitals [05/09/18 1720]  Enc Vitals Group     BP 109/77     Pulse Rate 79     Resp 18     Temp 98.9 F (37.2 C)     Temp Source Oral     SpO2 100 %     Weight      Height      Head Circumference      Peak Flow      Pain Score      Pain Loc      Pain Edu?      Excl. in [place]?    No data found.  Updated Vital Signs BP 109/77 (BP  Location: Left Arm)   Pulse 79   Temp 98.9 F (37.2 C) (Oral)   Resp 18   SpO2 100%    Physical Exam  Constitutional: He appears well-developed and well-nourished.  HENT:  Right Ear: External ear normal.  Left Ear: External ear normal.  Eyes: Conjunctivae are normal.  Neurological: A sensory deficit is present.  Skin:  Patient has trans-met amputation on the left Patient has a 1 x 2 cm full-thickness ulcer on the lateral aspect of his distal right foot just proximal to the fifth digit.  Nursing  note and vitals reviewed.    UC Treatments / Results  Labs (all labs ordered are listed, but only abnormal results are displayed) Labs Reviewed - No data to display  EKG None  Radiology No results found.  Procedures Procedures (including critical care time)  Medications Ordered in UC Medications - No data to display  Initial Impression / Assessment and Plan / UC Course  I have reviewed the triage vital signs and the nursing notes.  Pertinent labs & imaging results that were available during my care of the patient were reviewed by me and considered in my medical decision making (see chart for details).    Final Clinical Impressions(s) / UC Diagnoses   Final diagnoses:  Diabetic ulcer of right midfoot associated with type 2 diabetes mellitus, limited to breakdown of skin (HCC)     Discharge Instructions     The best to things you can do for your foot are washing it 3 times a day and soap and water and then airing it out with your leg elevated.  Apply the dressing 3 times a day if you are going out on errands or if you are going to bed and need to keep the sheets clean.  It is imperative that you make an appointment with the wound center to get further treatment.    ED Prescriptions    Medication Sig Dispense Auth. Provider   mupirocin ointment (BACTROBAN) 2 % Apply 1 application topically 3 (three) times daily. 30 g , , MD     Controlled Substance  Prescriptions [place] Controlled Substance Registry consulted? Not Applicable   , , MD 05/09/18 1756  

## 2018-05-09 NOTE — ED Triage Notes (Signed)
Pt here with ulceration to right foot on side x 1 year

## 2018-05-09 NOTE — Discharge Instructions (Addendum)
The best to things you can do for your foot are washing it 3 times a day and soap and water and then airing it out with your leg elevated.  Apply the dressing 3 times a day if you are going out on errands or if you are going to bed and need to keep the sheets clean.  It is imperative that you make an appointment with the wound center to get further treatment.

## 2018-05-14 ENCOUNTER — Emergency Department (HOSPITAL_COMMUNITY): Payer: Medicaid Other

## 2018-05-14 ENCOUNTER — Other Ambulatory Visit: Payer: Self-pay

## 2018-05-14 ENCOUNTER — Encounter (HOSPITAL_COMMUNITY): Payer: Self-pay | Admitting: Emergency Medicine

## 2018-05-14 ENCOUNTER — Inpatient Hospital Stay (HOSPITAL_COMMUNITY)
Admission: EM | Admit: 2018-05-14 | Discharge: 2018-05-19 | DRG: 854 | Disposition: A | Discharge status: Home Health | Payer: Medicaid Other | Attending: Internal Medicine | Admitting: Internal Medicine

## 2018-05-14 DIAGNOSIS — Z96642 Presence of left artificial hip joint: Secondary | ICD-10-CM | POA: Diagnosis present

## 2018-05-14 DIAGNOSIS — E1165 Type 2 diabetes mellitus with hyperglycemia: Secondary | ICD-10-CM | POA: Diagnosis present

## 2018-05-14 DIAGNOSIS — E1122 Type 2 diabetes mellitus with diabetic chronic kidney disease: Secondary | ICD-10-CM | POA: Diagnosis present

## 2018-05-14 DIAGNOSIS — I129 Hypertensive chronic kidney disease with stage 1 through stage 4 chronic kidney disease, or unspecified chronic kidney disease: Secondary | ICD-10-CM | POA: Diagnosis present

## 2018-05-14 DIAGNOSIS — E119 Type 2 diabetes mellitus without complications: Secondary | ICD-10-CM

## 2018-05-14 DIAGNOSIS — L97519 Non-pressure chronic ulcer of other part of right foot with unspecified severity: Secondary | ICD-10-CM | POA: Diagnosis present

## 2018-05-14 DIAGNOSIS — Z794 Long term (current) use of insulin: Secondary | ICD-10-CM | POA: Diagnosis not present

## 2018-05-14 DIAGNOSIS — N183 Chronic kidney disease, stage 3 (moderate): Secondary | ICD-10-CM | POA: Diagnosis present

## 2018-05-14 DIAGNOSIS — M1990 Unspecified osteoarthritis, unspecified site: Secondary | ICD-10-CM | POA: Diagnosis present

## 2018-05-14 DIAGNOSIS — L03115 Cellulitis of right lower limb: Secondary | ICD-10-CM | POA: Diagnosis present

## 2018-05-14 DIAGNOSIS — N179 Acute kidney failure, unspecified: Secondary | ICD-10-CM | POA: Diagnosis present

## 2018-05-14 DIAGNOSIS — M86171 Other acute osteomyelitis, right ankle and foot: Secondary | ICD-10-CM | POA: Diagnosis present

## 2018-05-14 DIAGNOSIS — E1169 Type 2 diabetes mellitus with other specified complication: Secondary | ICD-10-CM | POA: Diagnosis present

## 2018-05-14 DIAGNOSIS — E11621 Type 2 diabetes mellitus with foot ulcer: Secondary | ICD-10-CM | POA: Diagnosis present

## 2018-05-14 DIAGNOSIS — Z89422 Acquired absence of other left toe(s): Secondary | ICD-10-CM

## 2018-05-14 DIAGNOSIS — R739 Hyperglycemia, unspecified: Secondary | ICD-10-CM | POA: Diagnosis present

## 2018-05-14 DIAGNOSIS — E118 Type 2 diabetes mellitus with unspecified complications: Secondary | ICD-10-CM

## 2018-05-14 DIAGNOSIS — Z9889 Other specified postprocedural states: Secondary | ICD-10-CM

## 2018-05-14 DIAGNOSIS — A409 Streptococcal sepsis, unspecified: Principal | ICD-10-CM | POA: Diagnosis present

## 2018-05-14 DIAGNOSIS — F17210 Nicotine dependence, cigarettes, uncomplicated: Secondary | ICD-10-CM | POA: Diagnosis present

## 2018-05-14 LAB — CBC WITH DIFFERENTIAL/PLATELET
Abs Immature Granulocytes: 0 10*3/uL (ref 0.0–0.1)
BASOS ABS: 0.1 10*3/uL (ref 0.0–0.1)
BASOS PCT: 1 %
EOS ABS: 0.1 10*3/uL (ref 0.0–0.7)
Eosinophils Relative: 2 %
HCT: 45 % (ref 39.0–52.0)
Hemoglobin: 14.5 g/dL (ref 13.0–17.0)
IMMATURE GRANULOCYTES: 1 %
LYMPHS ABS: 1.1 10*3/uL (ref 0.7–4.0)
Lymphocytes Relative: 19 %
MCH: 27.3 pg (ref 26.0–34.0)
MCHC: 32.2 g/dL (ref 30.0–36.0)
MCV: 84.7 fL (ref 78.0–100.0)
Monocytes Absolute: 0.5 10*3/uL (ref 0.1–1.0)
Monocytes Relative: 9 %
NEUTROS PCT: 68 %
Neutro Abs: 3.8 10*3/uL (ref 1.7–7.7)
PLATELETS: 315 10*3/uL (ref 150–400)
RBC: 5.31 MIL/uL (ref 4.22–5.81)
RDW: 11.6 % (ref 11.5–15.5)
WBC: 5.6 10*3/uL (ref 4.0–10.5)

## 2018-05-14 LAB — CBC
HEMATOCRIT: 45.9 % (ref 39.0–52.0)
HEMOGLOBIN: 15.1 g/dL (ref 13.0–17.0)
MCH: 27.8 pg (ref 26.0–34.0)
MCHC: 32.9 g/dL (ref 30.0–36.0)
MCV: 84.4 fL (ref 78.0–100.0)
Platelets: 317 10*3/uL (ref 150–400)
RBC: 5.44 MIL/uL (ref 4.22–5.81)
RDW: 11.7 % (ref 11.5–15.5)
WBC: 8.2 10*3/uL (ref 4.0–10.5)

## 2018-05-14 LAB — COMPREHENSIVE METABOLIC PANEL
ALBUMIN: 2.8 g/dL — AB (ref 3.5–5.0)
ALT: 11 U/L (ref 0–44)
ANION GAP: 10 (ref 5–15)
AST: 13 U/L — AB (ref 15–41)
Alkaline Phosphatase: 59 U/L (ref 38–126)
BUN: 20 mg/dL (ref 6–20)
CHLORIDE: 102 mmol/L (ref 98–111)
CO2: 19 mmol/L — ABNORMAL LOW (ref 22–32)
Calcium: 8.7 mg/dL — ABNORMAL LOW (ref 8.9–10.3)
Creatinine, Ser: 1.7 mg/dL — ABNORMAL HIGH (ref 0.61–1.24)
GFR calc Af Amer: 53 mL/min — ABNORMAL LOW (ref >60)
GFR calc non Af Amer: 46 mL/min — ABNORMAL LOW (ref >60)
GLUCOSE: 438 mg/dL — AB (ref 70–99)
POTASSIUM: 4.7 mmol/L (ref 3.5–5.1)
Sodium: 131 mmol/L — ABNORMAL LOW (ref 135–145)
Total Bilirubin: 0.6 mg/dL (ref 0.3–1.2)
Total Protein: 6.4 g/dL — ABNORMAL LOW (ref 6.5–8.1)

## 2018-05-14 LAB — GLUCOSE, CAPILLARY
GLUCOSE-CAPILLARY: 221 mg/dL — AB (ref 70–99)
Glucose-Capillary: 347 mg/dL — ABNORMAL HIGH (ref 70–99)

## 2018-05-14 LAB — CREATININE, SERUM
Creatinine, Ser: 1.83 mg/dL — ABNORMAL HIGH (ref 0.61–1.24)
GFR calc Af Amer: 49 mL/min — ABNORMAL LOW
GFR calc non Af Amer: 42 mL/min — ABNORMAL LOW

## 2018-05-14 MED ORDER — VANCOMYCIN HCL 10 G IV SOLR
1250.0000 mg | Freq: Two times a day (BID) | INTRAVENOUS | Status: DC
  Administered 2018-05-15 – 2018-05-16 (×3): 1250 mg via INTRAVENOUS
  Filled 2018-05-14 (×4): qty 1250

## 2018-05-14 MED ORDER — SODIUM CHLORIDE 0.9 % IV BOLUS
1000.0000 mL | Freq: Once | INTRAVENOUS | Status: AC
  Administered 2018-05-14: 1000 mL via INTRAVENOUS

## 2018-05-14 MED ORDER — INSULIN DETEMIR 100 UNIT/ML ~~LOC~~ SOLN
10.0000 [IU] | Freq: Every day | SUBCUTANEOUS | Status: DC
  Administered 2018-05-14: 10 [IU] via SUBCUTANEOUS
  Filled 2018-05-14: qty 0.1

## 2018-05-14 MED ORDER — ACETAMINOPHEN 325 MG PO TABS
650.0000 mg | ORAL_TABLET | Freq: Four times a day (QID) | ORAL | Status: DC | PRN
  Administered 2018-05-14 – 2018-05-16 (×3): 650 mg via ORAL
  Filled 2018-05-14 (×2): qty 2

## 2018-05-14 MED ORDER — INSULIN ASPART 100 UNIT/ML ~~LOC~~ SOLN
0.0000 [IU] | Freq: Every day | SUBCUTANEOUS | Status: DC
  Administered 2018-05-14 – 2018-05-16 (×2): 2 [IU] via SUBCUTANEOUS

## 2018-05-14 MED ORDER — ACETAMINOPHEN 650 MG RE SUPP: 650.0000 mg | Freq: Four times a day (QID) | RECTAL | Status: DC | PRN

## 2018-05-14 MED ORDER — INSULIN ASPART 100 UNIT/ML ~~LOC~~ SOLN
0.0000 [IU] | Freq: Three times a day (TID) | SUBCUTANEOUS | Status: DC
  Administered 2018-05-15: 7 [IU] via SUBCUTANEOUS
  Administered 2018-05-15: 4 [IU] via SUBCUTANEOUS
  Administered 2018-05-15: 3 [IU] via SUBCUTANEOUS

## 2018-05-14 MED ORDER — SODIUM CHLORIDE 0.9 % IV SOLN
INTRAVENOUS | Status: DC
  Administered 2018-05-14 – 2018-05-18 (×7): via INTRAVENOUS

## 2018-05-14 MED ORDER — INSULIN ASPART 100 UNIT/ML ~~LOC~~ SOLN
10.0000 [IU] | Freq: Once | SUBCUTANEOUS | Status: AC
  Administered 2018-05-14: 10 [IU] via SUBCUTANEOUS
  Filled 2018-05-14: qty 1

## 2018-05-14 MED ORDER — PIPERACILLIN-TAZOBACTAM 3.375 G IVPB 30 MIN
3.3750 g | Freq: Once | INTRAVENOUS | Status: DC
  Filled 2018-05-14: qty 50

## 2018-05-14 MED ORDER — SODIUM CHLORIDE 0.9 % IV SOLN
2.0000 g | Freq: Three times a day (TID) | INTRAVENOUS | Status: DC
  Administered 2018-05-14 – 2018-05-17 (×9): 2 g via INTRAVENOUS
  Filled 2018-05-14 (×10): qty 2

## 2018-05-14 MED ORDER — GADOBUTROL 1 MMOL/ML IV SOLN
10.0000 mL | Freq: Once | INTRAVENOUS | Status: AC | PRN
  Administered 2018-05-14: 10 mL via INTRAVENOUS

## 2018-05-14 MED ORDER — VANCOMYCIN HCL 10 G IV SOLR
2000.0000 mg | Freq: Once | INTRAVENOUS | Status: DC
  Filled 2018-05-14: qty 2000

## 2018-05-14 MED ORDER — SENNOSIDES-DOCUSATE SODIUM 8.6-50 MG PO TABS
1.0000 | ORAL_TABLET | Freq: Every evening | ORAL | Status: DC | PRN
  Administered 2018-05-17: 1 via ORAL
  Filled 2018-05-14: qty 1

## 2018-05-14 MED ORDER — BISACODYL 5 MG PO TBEC
5.0000 mg | DELAYED_RELEASE_TABLET | Freq: Every day | ORAL | Status: DC | PRN
  Administered 2018-05-19: 5 mg via ORAL
  Filled 2018-05-14: qty 1

## 2018-05-14 MED ORDER — INSULIN ASPART 100 UNIT/ML ~~LOC~~ SOLN
3.0000 [IU] | Freq: Three times a day (TID) | SUBCUTANEOUS | Status: DC
  Administered 2018-05-15 – 2018-05-18 (×10): 3 [IU] via SUBCUTANEOUS

## 2018-05-14 MED ORDER — HEPARIN SODIUM (PORCINE) 5000 UNIT/ML IJ SOLN
5000.0000 [IU] | Freq: Three times a day (TID) | INTRAMUSCULAR | Status: DC
  Administered 2018-05-14 – 2018-05-19 (×12): 5000 [IU] via SUBCUTANEOUS
  Filled 2018-05-14 (×13): qty 1

## 2018-05-14 MED ORDER — HYDROCODONE-ACETAMINOPHEN 5-325 MG PO TABS
1.0000 | ORAL_TABLET | ORAL | Status: DC | PRN
  Administered 2018-05-15 – 2018-05-19 (×4): 2 via ORAL
  Filled 2018-05-14 (×4): qty 2

## 2018-05-14 NOTE — Care Management Note (Signed)
Case Management Note  Patient Details  Name: Edward [name] MRN: [id number] Date of Birth: 11/23/1970  Subjective/Objective:                  foot wound   Action/Plan: ED CM spoke with the patient at the bedside. Patient reports he has been out of work since 2017. He receives disability income from his former employer. Patient states Dr. Berdine Addison is his PCP and he is able to obtain samples of his medications when available at the office. CM explained the availability of some medications for $4 at [facility] and provided the patient with the $4 [facility] medication list. CM also informed the patient of the option of being seen at the [facility] where he can work with financial services and obtain his medications at the pharmacy. Provided the patient with the contact information for [facility]. Patient states he has a cane, walker, wheelchair and glucometer. He states he has some insulin but will run out soon. He is out of test strips for his meter. Patient lives in his home with his 2 adult sons. Reports his sons have been managing the wound on his right foot. Unit CM to continue to follow discharge needs.   Expected Discharge Date:   unknown               Expected Discharge Plan:     In-House Referral:     Discharge planning Services  CM Consult, Medication Assistance, [facility]  Post Acute Care Choice:    Choice offered to:     DME Arranged:    DME Agency:     HH Arranged:    HH Agency:     Status of Service:  In process, will continue to follow  If discussed at Long Length of Stay Meetings, dates discussed:    Additional Comments:  Apolonio Schneiders, RN 05/14/2018, 6:18 PM

## 2018-05-14 NOTE — Plan of Care (Signed)
  Problem: Coping: Goal: Level of anxiety will decrease Outcome: Progressing   Problem: Pain Managment: Goal: General experience of comfort will improve Outcome: Progressing   

## 2018-05-14 NOTE — ED Provider Notes (Signed)
[name] EMERGENCY DEPARTMENT Provider Note   CSN: [id number] Arrival date & time: 05/14/18  1139     History   Chief Complaint Chief Complaint  Patient presents with  . Foot Pain    HPI [name] is a 47 y.o. male with history of diabetes on insulin, chronic right foot ulcer, is here for evaluation of right worsening redness to right foot ulcer onset 1.5 weeks ago. Ulcer has been present for more than 6 months.  He used to be followed by [facility] for wound care but stopped going due to cost around May/June.  At home he has been using Silvadene, iodine soaks and dressings.  Associated with warmth, odor.  Onset of chills today.  Denies fevers, calf pain or asymmetric leg swelling.  No trauma. S/p left toe amputation at metatarsal joints.   HPI  Past Medical History:  Diagnosis Date  . Anxiety    self reported  . Arthritis   . Depression    self reported  . Diabetes mellitus   . Hypertension     Patient Active Problem List   Diagnosis Date Noted  . Viral pharyngitis 01/14/2017  . Cigarette smoker 01/14/2017  . Elevated blood pressure reading 01/14/2017  . Avascular necrosis of hip, left ([place]) 10/11/2016  . Status post left hip replacement 10/11/2016  . Unilateral primary osteoarthritis, left hip 09/18/2016  . S/P transmetatarsal amputation of foot, left ([place]) 07/25/2016  . Balance problem 07/25/2016  . Sepsis ([place]) 05/07/2016  . ARF (acute renal failure) ([place]) 05/07/2016  . Fournier's gangrene 08/16/2011  . Acute respiratory failure ([place]) 08/16/2011  . Septic shock(785.52) 08/16/2011  . Diabetic osteomyelitis ([place]) 08/16/2011    Past Surgical History:  Procedure Laterality Date  . AMPUTATION Left 05/10/2016   Procedure: Left Transmetatarsal Amputation;  Surgeon: [name], MD;  Location: [place];  Service: Orthopedics;  Laterality: Left;  . CIRCUMCISION  09/02/2011   Procedure: CIRCUMCISION ADULT;  Surgeon: Molli Hazard,  MD;  Location: [facility];  Service: Urology;  Laterality: N/A;  . I&D EXTREMITY  09/02/2011   Procedure: IRRIGATION AND DEBRIDEMENT EXTREMITY;  Surgeon: Theodoro Kos, DO;  Location: [facility];  Service: Plastics;  Laterality: N/A;  . INCISION AND DRAINAGE OF WOUND  08/12/2011   Procedure: IRRIGATION AND DEBRIDEMENT WOUND;  Surgeon: Zenovia Jarred, MD;  Location: [facility];  Service: General;;  perineum  . IRRIGATION AND DEBRIDEMENT ABSCESS  08/12/2011   Procedure: IRRIGATION AND DEBRIDEMENT ABSCESS;  Surgeon: Molli Hazard, MD;  Location: [place];  Service: Urology;  Laterality: N/A;  irrigation and debridment perineum    . IRRIGATION AND DEBRIDEMENT ABSCESS  08/14/2011   Procedure: MINOR INCISION AND DRAINAGE OF ABSCESS;  Surgeon: Molli Hazard, MD;  Location: [facility];  Service: Urology;  Laterality: N/A;  Perineal Wound Debridement;Placement Bilateral Testicular Thigh Pouches  . TOTAL HIP ARTHROPLASTY Left 10/11/2016   Procedure: LEFT TOTAL HIP ARTHROPLASTY ANTERIOR APPROACH;  Surgeon: Mcarthur Rossetti, MD;  [facility];  Service: Orthopedics;  Laterality: Left;        Home Medications    Prior to Admission medications   Medication Sig Start Date End Date Taking? Authorizing Provider  insulin lispro (HUMALOG) 100 UNIT/ML injection Inject 45 Units into the skin 3 (three) times daily before meals.   Yes [provider]  sitaGLIPtin-metformin (JANUMET) 50-1000 MG tablet Take 1 tablet by mouth 2 (two) times daily with a meal.  Yes [provider]  blood glucose meter kit and supplies Dispense based on patient and insurance preference. Use up to four times daily as directed. (FOR ICD-9 250.00, 250.01). 05/13/16   Barton Dubois, MD  Insulin Glargine (LANTUS) 100 UNIT/ML Solostar Pen Inject 40 Units into the skin daily at 10 pm. Patient not taking: Reported on 05/14/2018 05/13/16   Barton Dubois, MD  Insulin Pen Needle 31G X 5  MM MISC Use daily to inject insulin as instructed 05/13/16   Barton Dubois, MD  ketoconazole (NIZORAL) 2 % cream Apply 1 application topically 2 (two) times daily. Patient not taking: Reported on 05/14/2018 12/11/16   Robyn Haber, MD  magic mouthwash w/lidocaine SOLN Take 5 mLs by mouth 3 (three) times daily as needed for mouth pain. Patient not taking: Reported on 02/15/4764 4/65/03   Defelice, Jeanett Schlein, NP  mupirocin ointment (BACTROBAN) 2 % Apply 1 application topically 3 (three) times daily. Patient not taking: Reported on 05/14/2018 05/09/18   Robyn Haber, MD  silver sulfADIAZINE (SILVADENE) 1 % cream Apply 1 application topically daily. Patient not taking: Reported on 05/14/2018 01/17/17   Trula Slade, DPM  silver sulfADIAZINE (SILVADENE) 1 % cream Apply 1 application topically daily. Patient not taking: Reported on 05/14/2018 07/15/17   Trula Slade, DPM  tiZANidine (ZANAFLEX) 4 MG tablet Take 1 tablet (4 mg total) by mouth every 8 (eight) hours as needed for muscle spasms. Patient not taking: Reported on 05/14/2018 02/17/18   Mcarthur Rossetti, MD  traMADol (ULTRAM) 50 MG tablet Take 1 tablet (50 mg total) by mouth every 6 (six) hours as needed. Patient not taking: Reported on 05/14/2018 02/08/18   Robyn Haber, MD    Family History Family History  Problem Relation Age of Onset  . Diabetes Other     Social History Social History   Tobacco Use  . Smoking status: Current Every Day Smoker    Types: Cigarettes  . Smokeless tobacco: Never Used  Substance Use Topics  . Alcohol use: Yes    Alcohol/week: 0.0 standard drinks    Comment: has not had alcohol in a month  . Drug use: No     Allergies   Patient has no known allergies.   Review of Systems Review of Systems  Skin: Positive for wound.  Allergic/Immunologic: Positive for immunocompromised state (DM).  All other systems reviewed and are negative.    Physical Exam Updated Vital Signs BP (!)  144/93   Pulse 95   Temp 99.1 F (37.3 C) (Oral)   Resp 18   Ht 6' 1"  (1.854 m)   Wt 98.4 kg   SpO2 98%   BMI 28.63 kg/m   Physical Exam  Constitutional: He is oriented to person, place, and time. He appears well-developed and well-nourished. No distress.  NAD.  HENT:  Head: Normocephalic and atraumatic.  Right Ear: External ear normal.  Left Ear: External ear normal.  Nose: Nose normal.  Eyes: Conjunctivae and EOM are normal. No scleral icterus.  Neck: Normal range of motion. Neck supple.  Cardiovascular: Normal rate, regular rhythm, normal heart sounds and intact distal pulses.  No murmur heard. Pulmonary/Chest: Effort normal and breath sounds normal. He has no wheezes.  Musculoskeletal: Normal range of motion. He exhibits no deformity.  Neurological: He is alert and oriented to person, place, and time.  Skin: Skin is warm and dry. Capillary refill takes less than 2 seconds.  3x3.5 cm ulcerated wound to plantar aspect of right foot on lateral  side. See picture. Mild surrounding erythema, edema, warmth but no drainage or tenderness. H/o polyneuropathy and loss of sensation. Strong malodor.   Psychiatric: He has a normal mood and affect. His behavior is normal. Judgment and thought content normal.  Nursing note and vitals reviewed.      ED Treatments / Results  Labs (all labs ordered are listed, but only abnormal results are displayed) Labs Reviewed  COMPREHENSIVE METABOLIC PANEL - Abnormal; Notable for the following components:      Result Value   Sodium 131 (*)    CO2 19 (*)    Glucose, Bld 438 (*)    Creatinine, Ser 1.70 (*)    Calcium 8.7 (*)    Total Protein 6.4 (*)    Albumin 2.8 (*)    AST 13 (*)    GFR calc non Af Amer 46 (*)    GFR calc Af Amer 53 (*)    All other components within normal limits  CULTURE, BLOOD (ROUTINE X 2)  CULTURE, BLOOD (ROUTINE X 2)  AEROBIC CULTURE (SUPERFICIAL SPECIMEN)  CBC WITH DIFFERENTIAL/PLATELET     EKG None  Radiology Dg Foot Complete Right  Result Date: 05/14/2018 CLINICAL DATA:  Diabetic ulcer lateral right foot EXAM: RIGHT FOOT COMPLETE - 3+ VIEW COMPARISON:  10/16/2017 FINDINGS: Soft tissue ulcer noted over the right 5th MTP joint. Lucency noted at the base of the right 5th toe proximal phalanx could reflect fracture or osteomyelitis. No additional acute bony abnormality. IMPRESSION: Soft tissue defect over the right 5th metatarsal compatible with soft tissue ulcer. Lucency at the base of the right 5th toe proximal phalanx could reflect fracture or osteomyelitis. Electronically Signed   By: Rolm Baptise M.D.   On: 05/14/2018 12:54    Procedures Procedures (including critical care time)  Medications Ordered in ED Medications  sodium chloride 0.9 % bolus 1,000 mL (1,000 mLs Intravenous New Bag/Given 05/14/18 1451)  piperacillin-tazobactam (ZOSYN) IVPB 3.375 g (has no administration in time range)  insulin aspart (novoLOG) injection 10 Units (has no administration in time range)     Initial Impression / Assessment and Plan / ED Course  I have reviewed the triage vital signs and the nursing notes.  Pertinent labs & imaging results that were available during my care of the patient were reviewed by me and considered in my medical decision making (see chart for details).  Clinical Course as of May 14 1548  Thu May 14, 2018  1411 Creatinine(!): 1.70 [CG]  1411 GFR, Est African American(!): 53 [CG]  1411 Glucose(!): 438 [CG]  1411 Anion gap: 10 [CG]  1546 IMPRESSION: Soft tissue defect over the right 5th metatarsal compatible with soft tissue ulcer. Lucency at the base of the right 5th toe proximal phalanx could reflect fracture or osteomyelitis.  DG Foot Complete Right [CG]    Clinical Course User Index [CG] Kinnie Feil, PA-C   47 year old here with worsening right foot ulcer.  Chills onset today.  History of diabetes on insulin.  Status post left toe  amputations.  Exam as above, patient is afebrile, normotensive without tachycardia.  Wound has strong odor without focal fluctuance or signs of abscess.  No asymmetric calf or leg edema or tenderness.  X-ray with concerning signs of fracture versus osteomyelitis.  MRI pending to better evaluate.  Will initiate IV antibiotics, speak to hospitalist for admission for possible surgical debridement.  Hyperglycemia without signs of DKA/HHS.  IV fluids and insulin ordered in the ER.  Patient shared with  Dr. Rex Kras.   Final Clinical Impressions(s) / ED Diagnoses   Final diagnoses:  Chronic ulcer of great toe of right foot, unspecified ulcer stage [facility])  Hyperglycemia    ED Discharge Orders    None       Kinnie Feil, PA-C 05/14/18 1558    Little, Wenda Overland, MD 05/16/18 [phone]

## 2018-05-14 NOTE — ED Notes (Signed)
Social worker at bedside.

## 2018-05-14 NOTE — ED Triage Notes (Signed)
Here for diabetic foot wound he states that has been there x 1 week  States is open , rt foot  , has been seeing [facility] but has no $

## 2018-05-14 NOTE — ED Notes (Signed)
Patient asked to speak with a social worker regarding insurance and medication. Notified Education officer, museum.

## 2018-05-14 NOTE — Progress Notes (Signed)
CSW spoke with pt at bedside as RN expressed that pt had a number of concerns regarding Medicaid. CSW spoke with pt and informed pt that at the ED Medicaid Application are unable to be completed as that is something that DSS usually helps pt's get. Pt expressed that pt applied for Medicaid recently and then was denied. Pt then expressed to CSW that pt received reversal letter in the mail and is working with someone named Mrs. Marlou Sa at [facility] to get Medicaid.   Pt expressed concerns regarding inability to get medication that are needed as pt expressed being on a fixed income and not being able to afford medication at this time. CSW to have RNCM speak with pt regarding alterative services to ensure that pt gets medications.    Virgie Dad Arnez Stoneking, MSW, [facility] Clinical Social Worker [phone]

## 2018-05-14 NOTE — H&P (Signed)
History and Physical    Edward [name] BVQ:[id number] DOB: 10/11/1970 DOA: 05/14/2018  PCP: [name], MD Patient coming from: Home  Chief Complaint: Wound exposure  HPI: Edward [name] is a 47 y.o. male with medical history significant of with past medical history of uncontrolled diabetes mellitus type 2 and lower extremity wound comes to the hospital for evaluation of opening of the right lower extremity wound.  Patient states he has suffered from right lower extremity wound for past several months and has been debrided several times by outpatient podiatry from [facility].  He was last seen by them back in July 2019.  When he noticed his wound opened up again about a week ago he called their office but because of financial reasons he was not able to follow-up with them.  In the meantime he started having some foul drainage from that area therefore came to the ER today. In the ER patient's vital signs were overall unremarkable.  His labs were around baseline.  His creatinine baseline is around 1.6, today's 1.7.  X-ray of the foot showed concerns for osteomyelitis of his right lower extremity fifth toe.  MRI was ordered due to concerns of osteomyelitis.  Medical team was requested to admit the patient.  When I saw the patient he was quite comfortable and did not have any other new complaints besides concerns for right lower extremity wound.  Review of Systems: As per HPI otherwise 10 point review of systems negative.  Review of Systems Otherwise negative except as per HPI, including: General: Denies fever, chills, night sweats or unintended weight loss. Resp: Denies cough, wheezing, shortness of breath. Cardiac: Denies chest pain, palpitations, orthopnea, paroxysmal nocturnal dyspnea. GI: Denies abdominal pain, nausea, vomiting, diarrhea or constipation GU: Denies dysuria, frequency, hesitancy or incontinence MS: Right lower extremity pain Neuro: Denies headache, neurologic  deficits (focal weakness, numbness, tingling), abnormal gait Psych: Denies anxiety, depression, SI/HI/AVH Skin: Denies new rashes or lesions ID: Denies sick contacts, exotic exposures, travel  Past Medical History:  Diagnosis Date  . Anxiety    self reported  . Arthritis   . Depression    self reported  . Diabetes mellitus   . Hypertension     Past Surgical History:  Procedure Laterality Date  . AMPUTATION Left 05/10/2016   Procedure: Left Transmetatarsal Amputation;  Surgeon: [name], MD;  Location: [place];  Service: Orthopedics;  Laterality: Left;  . CIRCUMCISION  09/02/2011   Procedure: CIRCUMCISION ADULT;  Surgeon: Molli Hazard, MD;  Location: [place];  Service: Urology;  Laterality: N/A;  . I&D EXTREMITY  09/02/2011   Procedure: IRRIGATION AND DEBRIDEMENT EXTREMITY;  Surgeon: Theodoro Kos, DO;  Location: [place];  Service: Plastics;  Laterality: N/A;  . INCISION AND DRAINAGE OF WOUND  08/12/2011   Procedure: IRRIGATION AND DEBRIDEMENT WOUND;  Surgeon: Zenovia Jarred, MD;  Location: [place];  Service: General;;  perineum  . IRRIGATION AND DEBRIDEMENT ABSCESS  08/12/2011   Procedure: IRRIGATION AND DEBRIDEMENT ABSCESS;  Surgeon: Molli Hazard, MD;  Location: [place];  Service: Urology;  Laterality: N/A;  irrigation and debridment perineum    . IRRIGATION AND DEBRIDEMENT ABSCESS  08/14/2011   Procedure: MINOR INCISION AND DRAINAGE OF ABSCESS;  Surgeon: Molli Hazard, MD;  Location: [place];  Service: Urology;  Laterality: N/A;  Perineal Wound Debridement;Placement Bilateral Testicular Thigh Pouches  . TOTAL HIP ARTHROPLASTY Left 10/11/2016   Procedure: LEFT TOTAL HIP ARTHROPLASTY  ANTERIOR APPROACH;  Surgeon: Mcarthur Rossetti, MD;  Location: [facility];  Service: Orthopedics;  Laterality: Left;    SOCIAL HISTORY:  reports that he has been smoking cigarettes. He has never used smokeless tobacco. He reports that he drinks  alcohol. He reports that he does not use drugs.  No Known Allergies  FAMILY HISTORY: Family History  Problem Relation Age of Onset  . Diabetes Other      Prior to Admission medications   Medication Sig Start Date End Date Taking? Authorizing Provider  insulin lispro (HUMALOG) 100 UNIT/ML injection Inject 12 Units into the skin 3 (three) times daily before meals.    Yes [provider]  sitaGLIPtin-metformin (JANUMET) 50-1000 MG tablet Take 1 tablet by mouth 2 (two) times daily with a meal.   Yes [provider]  blood glucose meter kit and supplies Dispense based on patient and insurance preference. Use up to four times daily as directed. (FOR ICD-9 250.00, 250.01). 05/13/16   Barton Dubois, MD  Insulin Glargine (LANTUS) 100 UNIT/ML Solostar Pen Inject 40 Units into the skin daily at 10 pm. Patient not taking: Reported on 05/14/2018 05/13/16   Barton Dubois, MD  Insulin Pen Needle 31G X 5 MM MISC Use daily to inject insulin as instructed 05/13/16   Barton Dubois, MD  ketoconazole (NIZORAL) 2 % cream Apply 1 application topically 2 (two) times daily. Patient not taking: Reported on 05/14/2018 12/11/16   Robyn Haber, MD  magic mouthwash w/lidocaine SOLN Take 5 mLs by mouth 3 (three) times daily as needed for mouth pain. Patient not taking: Reported on 02/16/1002 4/96/11   Defelice, Jeanett Schlein, NP  mupirocin ointment (BACTROBAN) 2 % Apply 1 application topically 3 (three) times daily. Patient not taking: Reported on 05/14/2018 05/09/18   Robyn Haber, MD  silver sulfADIAZINE (SILVADENE) 1 % cream Apply 1 application topically daily. Patient not taking: Reported on 05/14/2018 01/17/17   Trula Slade, DPM  silver sulfADIAZINE (SILVADENE) 1 % cream Apply 1 application topically daily. Patient not taking: Reported on 05/14/2018 07/15/17   Trula Slade, DPM  tiZANidine (ZANAFLEX) 4 MG tablet Take 1 tablet (4 mg total) by mouth every 8 (eight) hours as needed for muscle  spasms. Patient not taking: Reported on 05/14/2018 02/17/18   Mcarthur Rossetti, MD  traMADol (ULTRAM) 50 MG tablet Take 1 tablet (50 mg total) by mouth every 6 (six) hours as needed. Patient not taking: Reported on 05/14/2018 02/08/18   Robyn Haber, MD    Physical Exam: Vitals:   05/14/18 1153 05/14/18 1335 05/14/18 1545  BP: 100/73 (!) 144/93 (!) 142/89  Pulse: (!) 103 95 95  Resp: 16 18 18   Temp: 99.1 F (37.3 C)    TempSrc: Oral    SpO2: 97% 98% 96%  Weight: 98.4 kg    Height: 6' 1"  (1.854 m)        Constitutional: NAD, calm, comfortable Eyes: PERRL, lids and conjunctivae normal ENMT: Mucous membranes are moist. Posterior pharynx clear of any exudate or lesions.Normal dentition.  Neck: normal, supple, no masses, no thyromegaly Respiratory: clear to auscultation bilaterally, no wheezing, no crackles. Normal respiratory effort. No accessory muscle use.  Cardiovascular: Regular rate and rhythm, no murmurs / rubs / gallops. No extremity edema. 2+ pedal pulses. No carotid bruits.  Abdomen: no tenderness, no masses palpated. No hepatosplenomegaly. Bowel sounds positive.  Musculoskeletal: Left lower extremity digit amputation noted.  Right lower extremity has open wound on the right lateral surface with exposed skin.  Skin: Right lower extremity exposed skin noted.  About quarter size open wound with some drainage. Neurologic: CN 2-12 grossly intact. Sensation intact, DTR normal. Strength 5/5 in all 4.  Psychiatric: Normal judgment and insight. Alert and oriented x 3. Normal mood.     Labs on Admission: I have personally reviewed following labs and imaging studies  CBC: Recent Labs  Lab 05/14/18 1213  WBC 5.6  NEUTROABS 3.8  HGB 14.5  HCT 45.0  MCV 84.7  PLT 132   Basic Metabolic Panel: Recent Labs  Lab 05/14/18 1213  NA 131*  K 4.7  CL 102  CO2 19*  GLUCOSE 438*  BUN 20  CREATININE 1.70*  CALCIUM 8.7*   GFR: Estimated Creatinine Clearance: 65.6 mL/min  (A) (by C-G formula based on SCr of 1.7 mg/dL (H)). Liver Function Tests: Recent Labs  Lab 05/14/18 1213  AST 13*  ALT 11  ALKPHOS 59  BILITOT 0.6  PROT 6.4*  ALBUMIN 2.8*   No results for input(s): LIPASE, AMYLASE in the last 168 hours. No results for input(s): AMMONIA in the last 168 hours. Coagulation Profile: No results for input(s): INR, PROTIME in the last 168 hours. Cardiac Enzymes: No results for input(s): CKTOTAL, CKMB, CKMBINDEX, TROPONINI in the last 168 hours. BNP (last 3 results) No results for input(s): PROBNP in the last 8760 hours. HbA1C: No results for input(s): HGBA1C in the last 72 hours. CBG: No results for input(s): GLUCAP in the last 168 hours. Lipid Profile: No results for input(s): CHOL, HDL, LDLCALC, TRIG, CHOLHDL, LDLDIRECT in the last 72 hours. Thyroid Function Tests: No results for input(s): TSH, T4TOTAL, FREET4, T3FREE, THYROIDAB in the last 72 hours. Anemia Panel: No results for input(s): VITAMINB12, FOLATE, FERRITIN, TIBC, IRON, RETICCTPCT in the last 72 hours. Urine analysis:    Component Value Date/Time   COLORURINE YELLOW 05/08/2016 0221   APPEARANCEUR CLEAR 05/08/2016 0221   LABSPEC 1.028 05/08/2016 0221   PHURINE 5.0 05/08/2016 0221   GLUCOSEU >1000 (A) 05/08/2016 0221   HGBUR SMALL (A) 05/08/2016 0221   BILIRUBINUR NEGATIVE 05/08/2016 0221   KETONESUR 15 (A) 05/08/2016 0221   PROTEINUR 30 (A) 05/08/2016 0221   UROBILINOGEN 0.2 05/04/2014 1002   NITRITE NEGATIVE 05/08/2016 0221   LEUKOCYTESUR NEGATIVE 05/08/2016 0221   Sepsis Labs: !!!!!!!!!!!!!!!!!!!!!!!!!!!!!!!!!!!!!!!!!!!! @LABRCNTIP (procalcitonin:4,lacticidven:4) )No results found for this or any previous visit (from the past 240 hour(s)).   Radiological Exams on Admission: X-ray of the right lower extremity shows concerning for osteomyelitis of the fifth metatarsal   All images have been reviewed by me personally.    Assessment/Plan Active Problems:   Diabetic  osteomyelitis ([place])   Osteomyelitis of right foot (H[facility])   DM2 (diabetes mellitus, type 2) ([place])    Right lower extremity wound concerning for acute osteomyelitis of the fifth metatarsal Uncontrolled diabetes mellitus type 2 Diabetic foot ulcer -Admit the patient to the hospital for further care and monitoring.  X-ray is concerning for soft tissue swelling and osteomyelitis.  No obvious fractures seen.  MRI of the foot has been done-results are pending - We will start patient on broad-spectrum antibiotics IV vancomycin and cefepime.  Can consult infectious disease tomorrow. -Holding off on home oral diabetic medications.  Started on insulin sliding scale and low-dose Lantus.  Diabetic coordinator has been consulted. -Local wound care-wound care team consulted. -Patient has been educated regarding importance of medication compliance.    DVT prophylaxis: Subcutaneous heparin Code Status: Full code Family Communication: None at bedside Disposition Plan: To be  determined Consults called: Wound care Admission status: Patient needs to be admitted inpatient due to acute osteomyelitis requiring IV antibiotic therapy.  He also needs better control over his blood glucose.  Local wound care.   Time Spent: 55 minutes.  >50% of the time was devoted to discussing the patients care, assessment, plan and disposition with other care givers along with counseling the patient about the risks and benefits of treatment.   Please Note: This patient record was dictated using Editor, commissioning. Chart creation errors have been sought, but may not always have been located. Such creation errors do not reflect on the Standard of Medical Care.   Chinelo Benn Arsenio Loader MD [facility] Pager [phone]  If 7PM-7AM, please contact night-coverage [email or web address] Password [facility]  05/14/2018, 6:23 PM

## 2018-05-14 NOTE — Progress Notes (Signed)
Pharmacy Antibiotic Note  Edward [name] is a 47 y.o. male admitted on 05/14/2018 with osteomylitis.  Pharmacy has been consulted for vancomycin and cefepime dosing. WBC 5.6 sCr 1.7, CrCl ~65, Tmax 99.1  Plan: Vancomycin 2000mg  x1, then 1250mg  Q12H Cefepime 2g Q8H F/u LOT/de-escalation, cultures, renal function  Height: 6\' 1"  (185.4 cm) Weight: 217 lb (98.4 kg) IBW/kg (Calculated) : 79.9  Temp (24hrs), Avg:99.1 F (37.3 C), Min:99.1 F (37.3 C), Max:99.1 F (37.3 C)  Recent Labs  Lab 05/14/18 1213  WBC 5.6  CREATININE 1.70*    Estimated Creatinine Clearance: 65.6 mL/min (A) (by C-G formula based on SCr of 1.7 mg/dL (H)).    No Known Allergies  Antimicrobials this admission: vanc 9/26 >> Cefepime 9/26 >>  Dose adjustments this admission:   Microbiology results:   Thank you for allowing pharmacy to be a part of this patient's care.  Harrietta Guardian, PharmD PGY1 Pharmacy Resident 05/14/2018    6:37 PM

## 2018-05-15 DIAGNOSIS — M869 Osteomyelitis, unspecified: Secondary | ICD-10-CM

## 2018-05-15 DIAGNOSIS — E1169 Type 2 diabetes mellitus with other specified complication: Secondary | ICD-10-CM

## 2018-05-15 LAB — COMPREHENSIVE METABOLIC PANEL
ALBUMIN: 2.6 g/dL — AB (ref 3.5–5.0)
ALT: 12 U/L (ref 0–44)
ANION GAP: 11 (ref 5–15)
AST: 15 U/L (ref 15–41)
Alkaline Phosphatase: 56 U/L (ref 38–126)
BILIRUBIN TOTAL: 0.8 mg/dL (ref 0.3–1.2)
BUN: 25 mg/dL — AB (ref 6–20)
CO2: 18 mmol/L — ABNORMAL LOW (ref 22–32)
Calcium: 8.5 mg/dL — ABNORMAL LOW (ref 8.9–10.3)
Chloride: 103 mmol/L (ref 98–111)
Creatinine, Ser: 1.61 mg/dL — ABNORMAL HIGH (ref 0.61–1.24)
GFR calc Af Amer: 57 mL/min — ABNORMAL LOW (ref >60)
GFR calc non Af Amer: 49 mL/min — ABNORMAL LOW (ref >60)
GLUCOSE: 226 mg/dL — AB (ref 70–99)
Potassium: 4.3 mmol/L (ref 3.5–5.1)
Sodium: 132 mmol/L — ABNORMAL LOW (ref 135–145)
TOTAL PROTEIN: 6.1 g/dL — AB (ref 6.5–8.1)

## 2018-05-15 LAB — BLOOD CULTURE ID PANEL (REFLEXED)
ACINETOBACTER BAUMANNII: NOT DETECTED
CANDIDA ALBICANS: NOT DETECTED
CANDIDA KRUSEI: NOT DETECTED
CANDIDA PARAPSILOSIS: NOT DETECTED
Candida glabrata: NOT DETECTED
Candida tropicalis: NOT DETECTED
ESCHERICHIA COLI: NOT DETECTED
Enterobacter cloacae complex: NOT DETECTED
Enterobacteriaceae species: NOT DETECTED
Enterococcus species: NOT DETECTED
HAEMOPHILUS INFLUENZAE: NOT DETECTED
Klebsiella oxytoca: NOT DETECTED
Klebsiella pneumoniae: NOT DETECTED
LISTERIA MONOCYTOGENES: NOT DETECTED
Neisseria meningitidis: NOT DETECTED
Proteus species: NOT DETECTED
Pseudomonas aeruginosa: NOT DETECTED
STREPTOCOCCUS PYOGENES: NOT DETECTED
STREPTOCOCCUS SPECIES: DETECTED — AB
Serratia marcescens: NOT DETECTED
Staphylococcus aureus (BCID): NOT DETECTED
Staphylococcus species: NOT DETECTED
Streptococcus agalactiae: NOT DETECTED
Streptococcus pneumoniae: NOT DETECTED

## 2018-05-15 LAB — SURGICAL PCR SCREEN
MRSA, PCR: NEGATIVE
STAPHYLOCOCCUS AUREUS: NEGATIVE

## 2018-05-15 LAB — CBC
HCT: 42.3 % (ref 39.0–52.0)
Hemoglobin: 13.9 g/dL (ref 13.0–17.0)
MCH: 27.9 pg (ref 26.0–34.0)
MCHC: 32.9 g/dL (ref 30.0–36.0)
MCV: 84.8 fL (ref 78.0–100.0)
Platelets: 259 10*3/uL (ref 150–400)
RBC: 4.99 MIL/uL (ref 4.22–5.81)
RDW: 11.6 % (ref 11.5–15.5)
WBC: 6.8 10*3/uL (ref 4.0–10.5)

## 2018-05-15 LAB — APTT: aPTT: 26 seconds (ref 24–36)

## 2018-05-15 LAB — GLUCOSE, CAPILLARY
Glucose-Capillary: 221 mg/dL — ABNORMAL HIGH (ref 70–99)
Glucose-Capillary: 132 mg/dL — ABNORMAL HIGH (ref 70–99)
Glucose-Capillary: 160 mg/dL — ABNORMAL HIGH (ref 70–99)
Glucose-Capillary: 186 mg/dL — ABNORMAL HIGH (ref 70–99)
Glucose-Capillary: 210 mg/dL — ABNORMAL HIGH (ref 70–99)

## 2018-05-15 LAB — HEMOGLOBIN A1C
Hgb A1c MFr Bld: 15.9 % — ABNORMAL HIGH (ref 4.8–5.6)
Mean Plasma Glucose: 409.63 mg/dL

## 2018-05-15 LAB — PROTIME-INR
INR: 1.01
PROTHROMBIN TIME: 13.2 s (ref 11.4–15.2)

## 2018-05-15 LAB — HIV ANTIBODY (ROUTINE TESTING W REFLEX): HIV Screen 4th Generation wRfx: NONREACTIVE

## 2018-05-15 MED ORDER — INSULIN DETEMIR 100 UNIT/ML ~~LOC~~ SOLN
20.0000 [IU] | Freq: Every day | SUBCUTANEOUS | Status: DC
  Filled 2018-05-15 (×2): qty 0.2

## 2018-05-15 NOTE — Progress Notes (Signed)
PROGRESS NOTE                                                                                                                                                                                                             Patient Demographics:    Edward [name], is a 47 y.o. male, DOB - 04-06-1971, FTD:[id number]  Admit date - 05/14/2018   Admitting Physician Ankit Arsenio Loader, MD  Outpatient Primary MD for the patient is [name], MD  LOS - 1   Chief Complaint  Patient presents with  . Foot Pain       Brief Narrative    47 y.o. male with medical history significant of with past medical history of uncontrolled diabetes mellitus type 2 and lower extremity wound comes to the hospital for evaluation of opening of the right lower extremity wound.  His work-up significant for sepsis secondary to left fifth metatarsal osteomyelitis.   Subjective:    Edward [name] today has, No headache, No chest pain, No abdominal pain - No Nausea or vomiting, max 102.2 overnight.   Assessment  & Plan :    Active Problems:   Diabetic osteomyelitis ([place])   Osteomyelitis of right foot (HCC)   DM2 (diabetes mellitus, type 2) ([place])   Sepsis secondary to right lower extremity wound infection with left fifth metatarsal osteomyelitis . -Presents with fever, tachycardia, leukocytosis, and worsening renal function, sepsis criteria met on admission. -Secondary to left foot infection, MRI with evidence of fifth metatarsal bone to myelitis, he has wound with foul-smelling order, have discussed with Dr. Shanda Bumps from podiatry, [facility] will evaluate the patient. -We will continue with broad-spectrum vancomycin and cefepime for now, low blood cultures, will adjust antibiotics accordingly.  Poorly controlled type 2 diabetes mellitus -Patient reports he is not compliant with diet, fully compliant with medications, he is on Levemir 40 units daily, and Janumet, his  hemoglobin A1c is 15.9, for now continue sliding scale, I will increase his Lantus from 10 to 20 units nightly.  AKI on CKD stage III -Baseline creatinine 1.5, peaked at 1.8, continue with IV fluids, consider ACE and ARB before discharge.      Code Status : full  Family Communication  : sister at bedside  Disposition Plan  : Home when stable  Consults  :  Podiatry  Procedures  :  none  DVT Prophylaxis  :  [place] heparin  Lab Results  Component Value Date   PLT 259 05/15/2018    Antibiotics  :   Anti-infectives (From admission, onward)   Start     Dose/Rate Route Frequency Ordered Stop   05/15/18 0700  vancomycin (VANCOCIN) 1,250 mg in sodium chloride 0.9 % 250 mL IVPB     1,250 mg 166.7 mL/hr over 90 Minutes Intravenous Every 12 hours 05/14/18 1840     05/14/18 1845  vancomycin (VANCOCIN) 2,000 mg in sodium chloride 0.9 % 500 mL IVPB     2,000 mg 250 mL/hr over 120 Minutes Intravenous  Once 05/14/18 1832     05/14/18 1845  ceFEPIme (MAXIPIME) 2 g in sodium chloride 0.9 % 100 mL IVPB     2 g 200 mL/hr over 30 Minutes Intravenous Every 8 hours 05/14/18 1832     05/14/18 1545  piperacillin-tazobactam (ZOSYN) IVPB 3.375 g  Status:  Discontinued     3.375 g 100 mL/hr over 30 Minutes Intravenous  Once 05/14/18 1530 05/14/18 1832        Objective:   Vitals:   05/14/18 1956 05/15/18 0048 05/15/18 0452 05/15/18 0500  BP: 126/88  (!) 152/87   Pulse: (!) 102  90   Resp: 18  17   Temp: (!) 102.2 F (39 C) 98.7 F (37.1 C) 98.4 F (36.9 C)   TempSrc: Oral Oral Oral   SpO2: 100%     Weight:    98.8 kg  Height:        Wt Readings from Last 3 Encounters:  05/15/18 98.8 kg  01/21/17 109 kg  10/11/16 105.7 kg     Intake/Output Summary (Last 24 hours) at 05/15/2018 1200 Last data filed at 05/15/2018 1136 Gross per 24 hour  Intake 3011.86 ml  Output -  Net 3011.86 ml     Physical Exam  Awake Alert, Oriented X 3, No new F.N deficits, Normal affect Supple Neck,No  JVD, No cervical lymphadenopathy appriciated.  Symmetrical Chest wall movement, Good air movement bilaterally, CTAB RRR,No Gallops,Rubs or new Murmurs, No Parasternal Heave +ve B.Sounds, Abd Soft, No tenderness, No organomegaly appriciated, No rebound - guarding or rigidity. For trans-metatarsal amputation, left foot with lateral wound with foul-smelling odor and discharge    Data Review:    CBC Recent Labs  Lab 05/14/18 1213 05/14/18 2003 05/15/18 0455  WBC 5.6 8.2 6.8  HGB 14.5 15.1 13.9  HCT 45.0 45.9 42.3  PLT 315 317 259  MCV 84.7 84.4 84.8  MCH 27.3 27.8 27.9  MCHC 32.2 32.9 32.9  RDW 11.6 11.7 11.6  LYMPHSABS 1.1  --   --   MONOABS 0.5  --   --   EOSABS 0.1  --   --   BASOSABS 0.1  --   --     Chemistries  Recent Labs  Lab 05/14/18 1213 05/14/18 2003 05/15/18 0455  NA 131*  --  132*  K 4.7  --  4.3  CL 102  --  103  CO2 19*  --  18*  GLUCOSE 438*  --  226*  BUN 20  --  25*  CREATININE 1.70* 1.83* 1.61*  CALCIUM 8.7*  --  8.5*  AST 13*  --  15  ALT 11  --  12  ALKPHOS 59  --  56  BILITOT 0.6  --  0.8   ------------------------------------------------------------------------------------------------------------------ No results for input(s): CHOL, HDL, LDLCALC, TRIG, CHOLHDL, LDLDIRECT in the last  72 hours.  Lab Results  Component Value Date   HGBA1C 15.9 (H) 05/14/2018   ------------------------------------------------------------------------------------------------------------------ No results for input(s): TSH, T4TOTAL, T3FREE, THYROIDAB in the last 72 hours.  Invalid input(s): FREET3 ------------------------------------------------------------------------------------------------------------------ No results for input(s): VITAMINB12, FOLATE, FERRITIN, TIBC, IRON, RETICCTPCT in the last 72 hours.  Coagulation profile Recent Labs  Lab 05/15/18 0455  INR 1.01    No results for input(s): DDIMER in the last 72 hours.  Cardiac Enzymes No  results for input(s): CKMB, TROPONINI, MYOGLOBIN in the last 168 hours.  Invalid input(s): CK ------------------------------------------------------------------------------------------------------------------ No results found for: BNP  Inpatient Medications  Scheduled Meds: . heparin  5,000 Units Subcutaneous Q8H  . insulin aspart  0-20 Units Subcutaneous TID WC  . insulin aspart  0-5 Units Subcutaneous QHS  . insulin aspart  3 Units Subcutaneous TID WC  . insulin detemir  20 Units Subcutaneous QHS   Continuous Infusions: . sodium chloride 100 mL/hr at 05/15/18 1136  . ceFEPime (MAXIPIME) IV Stopped (05/15/18 1053)  . vancomycin Stopped (05/15/18 0739)  . vancomycin     PRN Meds:.acetaminophen **OR** acetaminophen, bisacodyl, HYDROcodone-acetaminophen, senna-docusate  Micro Results Recent Results (from the past 240 hour(s))  Blood culture (routine x 2)     Status: None (Preliminary result)   Collection Time: 05/14/18  3:36 PM  Result Value Ref Range Status   Specimen Description BLOOD LEFT ARM  Final   Special Requests   Final    BOTTLES DRAWN AEROBIC ONLY Blood Culture results may not be optimal due to an inadequate volume of blood received in culture bottles   Culture   Final    NO GROWTH < 24 HOURS Performed at [facility] [address] [place], [place] [address]    Report Status PENDING  Incomplete  Blood culture (routine x 2)     Status: None (Preliminary result)   Collection Time: 05/14/18  6:05 PM  Result Value Ref Range Status   Specimen Description BLOOD RIGHT HAND  Final   Special Requests   Final    BOTTLES DRAWN AEROBIC ONLY Blood Culture adequate volume   Culture   Final    NO GROWTH < 24 HOURS Performed at [facility], [place] [address], [place], [place] [address]    Report Status PENDING  Incomplete    Radiology Reports Mr Foot Right W Wo Contrast  Result Date: 05/14/2018 CLINICAL DATA:  47 year old male with history of diabetes and  chronic right foot ulcer adjacent to the fifth toe presents with worsening erythema. Foot ulcer has been present for more than 6 months. EXAM: MRI OF THE RIGHT FOREFOOT WITHOUT AND WITH CONTRAST TECHNIQUE: Multiplanar, multisequence MR imaging of the right forefoot was performed before and after the administration of intravenous contrast. CONTRAST:  10 mL Gadavist COMPARISON:  Foot radiographs from the same day FINDINGS: Bones/Joint/Cartilage Marrow edema and subtle cortical bone loss involving the base of the right fifth proximal phalanx consistent with acute osteomyelitis. The first through fifth rays are otherwise unremarkable in appearance. Ligaments Noncontributory Muscles and Tendons No pyomyositis. Intact flexor and extensor tendons crossing the forefoot. Soft tissues Moderate degree of inflammatory soft tissue swelling and edema involving the base of the right fifth toe with soft tissue ulceration. More generalized diffuse soft tissue swelling of the forefoot more so along the plantar and to a lesser degree dorsum of the forefoot. IMPRESSION: Soft tissue ulcer with marrow edema and subtle cortical bone loss involving the fifth toe and fifth proximal  phalanx consistent with acute osteomyelitis. Electronically Signed   By: Ashley Royalty M.D.   On: 05/14/2018 17:30   Dg Foot Complete Right  Result Date: 05/14/2018 CLINICAL DATA:  Diabetic ulcer lateral right foot EXAM: RIGHT FOOT COMPLETE - 3+ VIEW COMPARISON:  10/16/2017 FINDINGS: Soft tissue ulcer noted over the right 5th MTP joint. Lucency noted at the base of the right 5th toe proximal phalanx could reflect fracture or osteomyelitis. No additional acute bony abnormality. IMPRESSION: Soft tissue defect over the right 5th metatarsal compatible with soft tissue ulcer. Lucency at the base of the right 5th toe proximal phalanx could reflect fracture or osteomyelitis. Electronically Signed   By: Rolm Baptise M.D.   On: 05/14/2018 12:54     Phillips Climes  M.D on 05/15/2018 at 12:00 PM  Between 7am to 7pm - Pager - [phone]  After 7pm go to [email or web address] - password [facility]  [facility] -  Office  [phone]

## 2018-05-15 NOTE — Consult Note (Addendum)
WOC consult requested for RLE. MRI indicates: "Marrow edema and subtle cortical bone loss involving the base of the right fifth proximal phalanx consistent with acute osteomyelitis."  This complex medical condition is beyoned the scope of practice for [place] nurses.  Please consult ortho service for further plan of care.   Please re-consult if further assistance is needed.  Thank-you,  Julien Girt MSN, [place], [place], [place], [place]

## 2018-05-15 NOTE — Progress Notes (Signed)
Inpatient Diabetes Program Recommendations  AACE/ADA: New Consensus Statement on Inpatient Glycemic Control (2015)  Target Ranges:  Prepandial:   less than 140 mg/dL      Peak postprandial:   less than 180 mg/dL (1-2 hours)      Critically ill patients:  140 - 180 mg/dL   Lab Results  Component Value Date   GLUCAP 132 (H) 05/15/2018   HGBA1C 15.9 (H) 05/14/2018    Review of Glycemic ControlResults for Edward, [name] (MRN [id number]) as of 05/15/2018 13:56  Ref. Range 05/14/2018 19:41 05/14/2018 21:09 05/15/2018 06:40 05/15/2018 11:22  Glucose-Capillary Latest Ref Range: 70 - 99 mg/dL 347 (H) 221 (H) 221 (H) 132 (H)    Diabetes history: Type 2 DM  Outpatient Diabetes medications: Lantus 40 units q HS, Humalog 12 units tid with meals- pt. Not taking Current orders for Inpatient glycemic control: Novolog resistant tid with meals and HS, Novolog 3 units tid with meals, Levemir 20 units q HS Inpatient Diabetes Program Recommendations:    Spoke with patient regarding diabetes management.  He admits that he has not been taking his insulin for the past month or so.  He usually gets samples from Dr. Cathey Endow office since losing his insurance in 2018.  Patient states he has been trying to get Medicaid and was recently told that his denial was "reversed".  He is working with a new Science writer or trying to get eligibility.  He states that he has been seeing his "foot" doctor and up until a month ago, his foot was getting better.  We briefly discussed the importance of glycemic control for healing and how high blood sugars can increase risk of infection.  Patient tearful about current state and states that he has been trying to do the right things and he is frustrated. We discussed that [facility] has insulin that can be purchased for 25$ a vial until he can get Medicaid.  We also discussed that [employer] has meters for purchase for 14$.  Attempted to encourage patient regarding his health and diabetes.  Blood  sugars improved.  Will follow while in the hospital.   Thanks,  Adah Perl, RN, BC-ADM Inpatient Diabetes Coordinator Pager [phone] (8a-5p)

## 2018-05-15 NOTE — Plan of Care (Signed)
  Problem: Pain Managment: Goal: General experience of comfort will improve Outcome: Progressing   Problem: Safety: Goal: Ability to remain free from injury will improve Outcome: Progressing   

## 2018-05-15 NOTE — Consult Note (Signed)
Reason for Consult:Osteomyelitis RT fifth toe  HPI:  Edward [name] is an 47 y.o. male.  History of uncontrolled diabetes mellitus and an ulcer to the sub-fifth MTPJ right foot that has been ongoing for > 1 year. Patient was having the wound treated in our office until last visit on 03/13/18. Patient presented to the ED on 05/14/18 w/ fever and chills and acute cellulitis RT foot. XR and MRI are both consistent with osteomyelitis of the proximal phalanx RT 5th toe. Podiatry consulted.   Past Medical History:  Diagnosis Date  . Anxiety    self reported  . Arthritis   . Depression    self reported  . Diabetes mellitus   . Hypertension     Past Surgical History:  Procedure Laterality Date  . AMPUTATION Left 05/10/2016   Procedure: Left Transmetatarsal Amputation;  Surgeon: [name], MD;  Location: [facility];  Service: Orthopedics;  Laterality: Left;  . CIRCUMCISION  09/02/2011   Procedure: CIRCUMCISION ADULT;  Surgeon: Molli Hazard, MD;  Location: [facility];  Service: Urology;  Laterality: N/A;  . I&D EXTREMITY  09/02/2011   Procedure: IRRIGATION AND DEBRIDEMENT EXTREMITY;  Surgeon: Theodoro Kos, DO;  Location: [facility];  Service: Plastics;  Laterality: N/A;  . INCISION AND DRAINAGE OF WOUND  08/12/2011   Procedure: IRRIGATION AND DEBRIDEMENT WOUND;  Surgeon: Zenovia Jarred, MD;  Location: [facility];  Service: General;;  perineum  . IRRIGATION AND DEBRIDEMENT ABSCESS  08/12/2011   Procedure: IRRIGATION AND DEBRIDEMENT ABSCESS;  Surgeon: Molli Hazard, MD;  Location: [facility];  Service: Urology;  Laterality: N/A;  irrigation and debridment perineum    . IRRIGATION AND DEBRIDEMENT ABSCESS  08/14/2011   Procedure: MINOR INCISION AND DRAINAGE OF ABSCESS;  Surgeon: Molli Hazard, MD;  Location: [facility];  Service: Urology;  Laterality: N/A;  Perineal Wound Debridement;Placement Bilateral Testicular Thigh Pouches  . TOTAL HIP ARTHROPLASTY  Left 10/11/2016   Procedure: LEFT TOTAL HIP ARTHROPLASTY ANTERIOR APPROACH;  Surgeon: Mcarthur Rossetti, MD;  Location: [facility];  Service: Orthopedics;  Laterality: Left;    Family History  Problem Relation Age of Onset  . Diabetes Other     Social History:  reports that he has been smoking cigarettes. He has never used smokeless tobacco. He reports that he drinks alcohol. He reports that he does not use drugs.  Allergies: No Known Allergies  Medications: I have reviewed the patient's current medications.  ROS Blood pressure 134/81, pulse 96, temperature 98.4 F (36.9 C), temperature source Oral, resp. rate 17, height 6\' 1"  (1.854 m), weight 98.8 kg, SpO2 100 %. Physical Exam Full thickness ulcer noted to the 5th MTPJ RT foot measuring approximately 3cm x 3cm. Strong malodor. Ulcer probes to joint capsule and likely bone. Wound base mostly consisting of necrotic/fibrotic tissue. Periwound is callused. Not significant for heavy drainage.   Radiographic exam impression 05/14/18:  Soft tissue defect over the right 5th metatarsal compatible with soft tissue ulcer. Lucency at the base of the right 5th toe proximal phalanx could reflect fracture or osteomyelitis.  MR foot right impression 05/14/18:  Soft tissue ulcer with marrow edema and subtle cortical bone loss involving the fifth toe and fifth proximal phalanx consistent with acute osteomyelitis.  Assessment/Plan: 1. Patient evaluated today and imaging reviewed.  2. Patient has had this ulcer for greater than 1 year without improvement. Osteomyelitis warranting surgical intervention. Patient would benefit from partial fifth ray amputation RT  foot. I discussed in detail the surgery and why it is medically necessary for limb preservation. Patient understands and all questions answered.  3. Orders placed for surgery today. Surgery tentatively planned for Saturday, 05/16/18, afternoon.  4. Podiatry will follow. Please contact me with any  concerns or questions. Mobile [phone]  [name] 05/15/2018, 6:11 PM   [name], DPM [facility]  Dr. [name], DPM    [address], [place] [address]                Office [phone]  Fax [phone]

## 2018-05-15 NOTE — Plan of Care (Signed)

## 2018-05-15 NOTE — Progress Notes (Signed)
PHARMACY - PHYSICIAN COMMUNICATION CRITICAL VALUE ALERT - BLOOD CULTURE IDENTIFICATION (BCID)  Edward [name] is an 47 y.o. male who presented to [facility] on 05/14/2018 with a chief complaint of osteomyelitis   Name of physician (or Provider) Contacted: Jeannette Corpus, [facility]  Current antibiotics: Vancomycin/Cefepime   Changes to prescribed antibiotics recommended:  No changes for now in the setting of osteomyelitis Surgery planned 9/28  Results for orders placed or performed during the hospital encounter of 05/14/18  Blood Culture ID Panel (Reflexed) (Collected: 05/14/2018  6:05 PM)  Result Value Ref Range   Enterococcus species NOT DETECTED NOT DETECTED   Listeria monocytogenes NOT DETECTED NOT DETECTED   Staphylococcus species NOT DETECTED NOT DETECTED   Staphylococcus aureus NOT DETECTED NOT DETECTED   Streptococcus species DETECTED (A) NOT DETECTED   Streptococcus agalactiae NOT DETECTED NOT DETECTED   Streptococcus pneumoniae NOT DETECTED NOT DETECTED   Streptococcus pyogenes NOT DETECTED NOT DETECTED   Acinetobacter baumannii NOT DETECTED NOT DETECTED   Enterobacteriaceae species NOT DETECTED NOT DETECTED   Enterobacter cloacae complex NOT DETECTED NOT DETECTED   Escherichia coli NOT DETECTED NOT DETECTED   Klebsiella oxytoca NOT DETECTED NOT DETECTED   Klebsiella pneumoniae NOT DETECTED NOT DETECTED   Proteus species NOT DETECTED NOT DETECTED   Serratia marcescens NOT DETECTED NOT DETECTED   Haemophilus influenzae NOT DETECTED NOT DETECTED   Neisseria meningitidis NOT DETECTED NOT DETECTED   Pseudomonas aeruginosa NOT DETECTED NOT DETECTED   Candida albicans NOT DETECTED NOT DETECTED   Candida glabrata NOT DETECTED NOT DETECTED   Candida krusei NOT DETECTED NOT DETECTED   Candida parapsilosis NOT DETECTED NOT DETECTED   Candida tropicalis NOT DETECTED NOT DETECTED    Lea, [place] 05/15/2018  11:33 PM

## 2018-05-15 NOTE — H&P (View-Only) (Signed)
Reason for Consult:Osteomyelitis RT fifth toe  HPI:  Edward [name] is an 47 y.o. male.  History of uncontrolled diabetes mellitus and an ulcer to the sub-fifth MTPJ right foot that has been ongoing for > 1 year. Patient was having the wound treated in our office until last visit on 03/13/18. Patient presented to the ED on 05/14/18 w/ fever and chills and acute cellulitis RT foot. XR and MRI are both consistent with osteomyelitis of the proximal phalanx RT 5th toe. Podiatry consulted.   Past Medical History:  Diagnosis Date  . Anxiety    self reported  . Arthritis   . Depression    self reported  . Diabetes mellitus   . Hypertension     Past Surgical History:  Procedure Laterality Date  . AMPUTATION Left 05/10/2016   Procedure: Left Transmetatarsal Amputation;  Surgeon: [name], MD;  Location: [facility];  Service: Orthopedics;  Laterality: Left;  . CIRCUMCISION  09/02/2011   Procedure: CIRCUMCISION ADULT;  Surgeon: Molli Hazard, MD;  Location: [facility];  Service: Urology;  Laterality: N/A;  . I&D EXTREMITY  09/02/2011   Procedure: IRRIGATION AND DEBRIDEMENT EXTREMITY;  Surgeon: Theodoro Kos, DO;  Location: [facility];  Service: Plastics;  Laterality: N/A;  . INCISION AND DRAINAGE OF WOUND  08/12/2011   Procedure: IRRIGATION AND DEBRIDEMENT WOUND;  Surgeon: Zenovia Jarred, MD;  Location: [facility];  Service: General;;  perineum  . IRRIGATION AND DEBRIDEMENT ABSCESS  08/12/2011   Procedure: IRRIGATION AND DEBRIDEMENT ABSCESS;  Surgeon: Molli Hazard, MD;  Location: [facility];  Service: Urology;  Laterality: N/A;  irrigation and debridment perineum    . IRRIGATION AND DEBRIDEMENT ABSCESS  08/14/2011   Procedure: MINOR INCISION AND DRAINAGE OF ABSCESS;  Surgeon: Molli Hazard, MD;  Location: [facility];  Service: Urology;  Laterality: N/A;  Perineal Wound Debridement;Placement Bilateral Testicular Thigh Pouches  . TOTAL HIP ARTHROPLASTY  Left 10/11/2016   Procedure: LEFT TOTAL HIP ARTHROPLASTY ANTERIOR APPROACH;  Surgeon: Mcarthur Rossetti, MD;  Location: [facility];  Service: Orthopedics;  Laterality: Left;    Family History  Problem Relation Age of Onset  . Diabetes Other     Social History:  reports that he has been smoking cigarettes. He has never used smokeless tobacco. He reports that he drinks alcohol. He reports that he does not use drugs.  Allergies: No Known Allergies  Medications: I have reviewed the patient's current medications.  ROS Blood pressure 134/81, pulse 96, temperature 98.4 F (36.9 C), temperature source Oral, resp. rate 17, height 6\' 1"  (1.854 m), weight 98.8 kg, SpO2 100 %. Physical Exam Full thickness ulcer noted to the 5th MTPJ RT foot measuring approximately 3cm x 3cm. Strong malodor. Ulcer probes to joint capsule and likely bone. Wound base mostly consisting of necrotic/fibrotic tissue. Periwound is callused. Not significant for heavy drainage.   Radiographic exam impression 05/14/18:  Soft tissue defect over the right 5th metatarsal compatible with soft tissue ulcer. Lucency at the base of the right 5th toe proximal phalanx could reflect fracture or osteomyelitis.  MR foot right impression 05/14/18:  Soft tissue ulcer with marrow edema and subtle cortical bone loss involving the fifth toe and fifth proximal phalanx consistent with acute osteomyelitis.  Assessment/Plan: 1. Patient evaluated today and imaging reviewed.  2. Patient has had this ulcer for greater than 1 year without improvement. Osteomyelitis warranting surgical intervention. Patient would benefit from partial fifth ray amputation RT  foot. I discussed in detail the surgery and why it is medically necessary for limb preservation. Patient understands and all questions answered.  3. Orders placed for surgery today. Surgery tentatively planned for Saturday, 05/16/18, afternoon.  4. Podiatry will follow. Please contact me with any  concerns or questions. Mobile [phone]  Edward [name] 05/15/2018, 6:11 PM   Edward [name], DPM [facility]  Dr. Edrick [name], DPM    [address], [place] [address]                Office [phone]  Fax [phone]

## 2018-05-16 ENCOUNTER — Encounter (HOSPITAL_COMMUNITY)
Admission: EM | Disposition: A | Discharge status: Home Health | Payer: Self-pay | Source: Home / Self Care | Attending: Internal Medicine

## 2018-05-16 ENCOUNTER — Inpatient Hospital Stay (HOSPITAL_COMMUNITY): Payer: Medicaid Other | Admitting: Certified Registered"

## 2018-05-16 ENCOUNTER — Inpatient Hospital Stay (HOSPITAL_COMMUNITY): Payer: Medicaid Other

## 2018-05-16 ENCOUNTER — Encounter (HOSPITAL_COMMUNITY): Payer: Self-pay | Admitting: Certified Registered Nurse Anesthetist

## 2018-05-16 DIAGNOSIS — R7881 Bacteremia: Secondary | ICD-10-CM

## 2018-05-16 DIAGNOSIS — L97519 Non-pressure chronic ulcer of other part of right foot with unspecified severity: Secondary | ICD-10-CM

## 2018-05-16 HISTORY — PX: AMPUTATION: SHX166

## 2018-05-16 LAB — BASIC METABOLIC PANEL
Anion gap: 8 (ref 5–15)
BUN: 23 mg/dL — ABNORMAL HIGH (ref 6–20)
CALCIUM: 8 mg/dL — AB (ref 8.9–10.3)
CO2: 18 mmol/L — AB (ref 22–32)
CREATININE: 1.49 mg/dL — AB (ref 0.61–1.24)
Chloride: 104 mmol/L (ref 98–111)
GFR calc non Af Amer: 54 mL/min — ABNORMAL LOW (ref >60)
Glucose, Bld: 358 mg/dL — ABNORMAL HIGH (ref 70–99)
Potassium: 4.3 mmol/L (ref 3.5–5.1)
SODIUM: 130 mmol/L — AB (ref 135–145)

## 2018-05-16 LAB — CBC
HEMATOCRIT: 38.5 % — AB (ref 39.0–52.0)
HEMOGLOBIN: 12.6 g/dL — AB (ref 13.0–17.0)
MCH: 27.7 pg (ref 26.0–34.0)
MCHC: 32.7 g/dL (ref 30.0–36.0)
MCV: 84.6 fL (ref 78.0–100.0)
Platelets: 258 10*3/uL (ref 150–400)
RBC: 4.55 MIL/uL (ref 4.22–5.81)
RDW: 11.6 % (ref 11.5–15.5)
WBC: 5.8 10*3/uL (ref 4.0–10.5)

## 2018-05-16 LAB — GLUCOSE, CAPILLARY
GLUCOSE-CAPILLARY: 144 mg/dL — AB (ref 70–99)
Glucose-Capillary: 301 mg/dL — ABNORMAL HIGH (ref 70–99)
Glucose-Capillary: 318 mg/dL — ABNORMAL HIGH (ref 70–99)
Glucose-Capillary: 119 mg/dL — ABNORMAL HIGH (ref 70–99)
Glucose-Capillary: 78 mg/dL (ref 70–99)
Glucose-Capillary: 118 mg/dL — ABNORMAL HIGH (ref 70–99)
Glucose-Capillary: 208 mg/dL — ABNORMAL HIGH (ref 70–99)
Glucose-Capillary: 246 mg/dL — ABNORMAL HIGH (ref 70–99)

## 2018-05-16 SURGERY — AMPUTATION, FOOT, RAY
Anesthesia: Monitor Anesthesia Care | Site: Foot | Laterality: Right

## 2018-05-16 MED ORDER — LIDOCAINE 2% (20 MG/ML) 5 ML SYRINGE
INTRAMUSCULAR | Status: AC
  Filled 2018-05-16: qty 5

## 2018-05-16 MED ORDER — ONDANSETRON HCL 4 MG/2ML IJ SOLN
INTRAMUSCULAR | Status: DC | PRN
  Administered 2018-05-16: 4 mg via INTRAVENOUS

## 2018-05-16 MED ORDER — ACETAMINOPHEN 10 MG/ML IV SOLN
INTRAVENOUS | Status: AC
  Filled 2018-05-16: qty 100

## 2018-05-16 MED ORDER — LACTATED RINGERS IV SOLN
INTRAVENOUS | Status: DC
  Administered 2018-05-16 (×2): via INTRAVENOUS

## 2018-05-16 MED ORDER — PROPOFOL 10 MG/ML IV BOLUS
INTRAVENOUS | Status: AC
  Filled 2018-05-16: qty 20

## 2018-05-16 MED ORDER — SODIUM CHLORIDE 0.9 % IV SOLN: 250.0000 mL | INTRAVENOUS | Status: DC | PRN

## 2018-05-16 MED ORDER — FENTANYL CITRATE (PF) 100 MCG/2ML IJ SOLN: 25.0000 ug | INTRAMUSCULAR | Status: DC | PRN

## 2018-05-16 MED ORDER — OXYCODONE-ACETAMINOPHEN 5-325 MG PO TABS
1.0000 | ORAL_TABLET | ORAL | Status: DC | PRN
  Administered 2018-05-17: 1 via ORAL
  Administered 2018-05-17 – 2018-05-18 (×2): 2 via ORAL
  Filled 2018-05-16 (×2): qty 2
  Filled 2018-05-16: qty 1

## 2018-05-16 MED ORDER — PHENYLEPHRINE HCL 10 MG/ML IJ SOLN
INTRAMUSCULAR | Status: DC | PRN
  Administered 2018-05-16: 120 ug via INTRAVENOUS

## 2018-05-16 MED ORDER — PROPOFOL 10 MG/ML IV BOLUS
INTRAVENOUS | Status: DC | PRN
  Administered 2018-05-16: 20 mg via INTRAVENOUS

## 2018-05-16 MED ORDER — ACETAMINOPHEN 10 MG/ML IV SOLN
INTRAVENOUS | Status: DC | PRN
  Administered 2018-05-16: 1000 mg via INTRAVENOUS

## 2018-05-16 MED ORDER — SODIUM CHLORIDE 0.9% FLUSH
3.0000 mL | Freq: Two times a day (BID) | INTRAVENOUS | Status: DC
  Administered 2018-05-16 – 2018-05-18 (×4): 3 mL via INTRAVENOUS

## 2018-05-16 MED ORDER — PROPOFOL 500 MG/50ML IV EMUL
INTRAVENOUS | Status: DC | PRN
  Administered 2018-05-16: 175 ug/kg/min via INTRAVENOUS

## 2018-05-16 MED ORDER — ROCURONIUM BROMIDE 50 MG/5ML IV SOSY
PREFILLED_SYRINGE | INTRAVENOUS | Status: AC
  Filled 2018-05-16: qty 5

## 2018-05-16 MED ORDER — INSULIN DETEMIR 100 UNIT/ML ~~LOC~~ SOLN
15.0000 [IU] | Freq: Every day | SUBCUTANEOUS | Status: DC
  Administered 2018-05-16 – 2018-05-17 (×2): 15 [IU] via SUBCUTANEOUS
  Filled 2018-05-16 (×2): qty 0.15

## 2018-05-16 MED ORDER — BUPIVACAINE HCL (PF) 0.5 % IJ SOLN
INTRAMUSCULAR | Status: AC
  Filled 2018-05-16: qty 30

## 2018-05-16 MED ORDER — FENTANYL CITRATE (PF) 250 MCG/5ML IJ SOLN
INTRAMUSCULAR | Status: DC | PRN
  Administered 2018-05-16 (×2): 50 ug via INTRAVENOUS

## 2018-05-16 MED ORDER — PROMETHAZINE HCL 25 MG PO TABS: 12.5000 mg | ORAL_TABLET | ORAL | Status: DC | PRN

## 2018-05-16 MED ORDER — INSULIN ASPART 100 UNIT/ML ~~LOC~~ SOLN
0.0000 [IU] | SUBCUTANEOUS | Status: DC
  Administered 2018-05-16: 3 [IU] via SUBCUTANEOUS
  Administered 2018-05-16: 4 [IU] via SUBCUTANEOUS
  Administered 2018-05-16: 15 [IU] via SUBCUTANEOUS
  Administered 2018-05-17 (×2): 3 [IU] via SUBCUTANEOUS
  Administered 2018-05-17: 4 [IU] via SUBCUTANEOUS
  Administered 2018-05-17: 3 [IU] via SUBCUTANEOUS
  Administered 2018-05-17 (×2): 7 [IU] via SUBCUTANEOUS
  Administered 2018-05-18: 3 [IU] via SUBCUTANEOUS
  Administered 2018-05-18 – 2018-05-19 (×3): 4 [IU] via SUBCUTANEOUS
  Administered 2018-05-19 (×2): 3 [IU] via SUBCUTANEOUS

## 2018-05-16 MED ORDER — CHLORHEXIDINE GLUCONATE 4 % EX LIQD: 60.0000 mL | Freq: Once | CUTANEOUS | Status: DC

## 2018-05-16 MED ORDER — IBUPROFEN 200 MG PO TABS
800.0000 mg | ORAL_TABLET | Freq: Once | ORAL | Status: AC
  Administered 2018-05-16: 800 mg via ORAL
  Filled 2018-05-16: qty 4

## 2018-05-16 MED ORDER — SODIUM CHLORIDE 0.9% FLUSH: 3.0000 mL | INTRAVENOUS | Status: DC | PRN

## 2018-05-16 MED ORDER — LIDOCAINE-EPINEPHRINE 1 %-1:100000 IJ SOLN
INTRAMUSCULAR | Status: DC | PRN
  Administered 2018-05-16: 10 mL

## 2018-05-16 MED ORDER — SUCCINYLCHOLINE CHLORIDE 200 MG/10ML IV SOSY
PREFILLED_SYRINGE | INTRAVENOUS | Status: AC
  Filled 2018-05-16: qty 10

## 2018-05-16 MED ORDER — FENTANYL CITRATE (PF) 250 MCG/5ML IJ SOLN
INTRAMUSCULAR | Status: AC
  Filled 2018-05-16: qty 5

## 2018-05-16 MED ORDER — BUPIVACAINE HCL (PF) 0.5 % IJ SOLN
INTRAMUSCULAR | Status: DC | PRN
  Administered 2018-05-16: 10 mL

## 2018-05-16 MED ORDER — 0.9 % SODIUM CHLORIDE (POUR BTL) OPTIME
TOPICAL | Status: DC | PRN
  Administered 2018-05-16: 1000 mL

## 2018-05-16 MED ORDER — LIDOCAINE-EPINEPHRINE 1 %-1:100000 IJ SOLN
INTRAMUSCULAR | Status: AC
  Filled 2018-05-16: qty 1

## 2018-05-16 MED ORDER — MIDAZOLAM HCL 2 MG/2ML IJ SOLN
INTRAMUSCULAR | Status: AC
  Filled 2018-05-16: qty 2

## 2018-05-16 SURGICAL SUPPLY — 27 items
BANDAGE ACE 4X5 VEL STRL LF (GAUZE/BANDAGES/DRESSINGS) ×2 IMPLANT
BLADE AVERAGE 25MMX9MM (BLADE) ×1
BLADE AVERAGE 25X9 (BLADE) ×1 IMPLANT
BNDG GAUZE ELAST 4 BULKY (GAUZE/BANDAGES/DRESSINGS) ×2 IMPLANT
COVER SURGICAL LIGHT HANDLE (MISCELLANEOUS) ×3 IMPLANT
CUFF TOURNIQUET SINGLE 18IN (TOURNIQUET CUFF) ×3 IMPLANT
ELECT REM PT RETURN 9FT ADLT (ELECTROSURGICAL) ×3
ELECTRODE REM PT RTRN 9FT ADLT (ELECTROSURGICAL) IMPLANT
GLOVE BIO SURGEON STRL SZ8 (GLOVE) ×3 IMPLANT
GLOVE BIOGEL PI IND STRL 8 (GLOVE) ×1 IMPLANT
GLOVE BIOGEL PI INDICATOR 8 (GLOVE) ×2
GOWN STRL REUS W/ TWL LRG LVL3 (GOWN DISPOSABLE) ×2 IMPLANT
GOWN STRL REUS W/TWL LRG LVL3 (GOWN DISPOSABLE) ×6
KIT BASIN OR (CUSTOM PROCEDURE TRAY) ×3 IMPLANT
KIT DRSG PREVENA PLUS 7DAY 125 (MISCELLANEOUS) ×2 IMPLANT
KIT TURNOVER KIT B (KITS) ×3 IMPLANT
NEEDLE 27GAX1X1/2 (NEEDLE) ×3 IMPLANT
NS IRRIG 1000ML POUR BTL (IV SOLUTION) ×3 IMPLANT
PACK ORTHO EXTREMITY (CUSTOM PROCEDURE TRAY) ×3 IMPLANT
PAD ARMBOARD 7.5X6 YLW CONV (MISCELLANEOUS) ×4 IMPLANT
SCRUB BETADINE 4OZ XXX (MISCELLANEOUS) ×3 IMPLANT
SOL PREP POV-IOD 4OZ 10% (MISCELLANEOUS) ×2 IMPLANT
STAPLER VISISTAT (STAPLE) ×2 IMPLANT
TOWEL OR 17X24 6PK STRL BLUE (TOWEL DISPOSABLE) ×3 IMPLANT
TUBE CONNECTING 12'X1/4 (SUCTIONS) ×1
TUBE CONNECTING 12X1/4 (SUCTIONS) ×2 IMPLANT
YANKAUER SUCT BULB TIP NO VENT (SUCTIONS) ×2 IMPLANT

## 2018-05-16 NOTE — Progress Notes (Signed)
PROGRESS NOTE                                                                                                                                                                                                             Patient Demographics:    Edward [name], is a 47 y.o. male, DOB - 04-30-1971, YVO:[id number]  Admit date - 05/14/2018   Admitting Physician Ankit Arsenio Loader, MD  Outpatient Primary MD for the patient is [name], MD  LOS - 2   Chief Complaint  Patient presents with  . Foot Pain       Brief Narrative    47 y.o. male with medical history significant of with past medical history of uncontrolled diabetes mellitus type 2 and lower extremity wound comes to the hospital for evaluation of opening of the right lower extremity wound.  His work-up significant for sepsis secondary to left fifth metatarsal osteomyelitis, by podiatry, plan for fifth ray amputation today, work-up significant for Streptococcus bacteremia.   Subjective:    Edward [name] today has, No headache, No chest pain, No abdominal pain - No Nausea or vomiting, no other fever 102 yesterday evening as well.   Assessment  & Plan :    Active Problems:   Diabetic osteomyelitis ([place])   Osteomyelitis of right foot ([facility])   DM2 (diabetes mellitus, type 2) ([place])   Sepsis secondary to right lower extremity wound infection with left fifth metatarsal osteomyelitis . -Presents with fever, tachycardia, leukocytosis, and worsening renal function, sepsis criteria met on admission. -Secondary to left foot infection, MRI with evidence of fifth metatarsal bone to myelitis, he has wound with foul-smelling order, podiatry input greatly appreciated, plan for surgery today partial fifth ray amputation of the right foot. -Broad-spectrum antibiotic vancomycin and cefepime on admission, now blood culture growing Streptococcus, I will DC vancomycin and continue with cefepime, given he still febrile  will continue force with cefepime for another 24 hours, hopefully can narrow after that if he becomes afebrile, as well will follow on intraoperative cultures.  Streptococcus bacteremia -Due to above, will repeat blood cultures, continue with cefepime.  Poorly controlled type 2 diabetes mellitus -Patient reports he is not compliant with diet,not  fully compliant with medications, -Ruben A1c is 15.9 -ABG uncontrolled as he did not receive his Levemir at night, so given in the morning,  and then changes as a sliding scale from mild to moderate and change it every 4 hours as he is n.p.o..  AKI on CKD stage III -Baseline creatinine 1.5, peaked at 1.8, with hydration, consider ACE and ARB before discharge.      Code Status : full  Family Communication  : None at bedside  Disposition Plan  : Home when stable  Consults  :  Podiatry  Procedures  : none  DVT Prophylaxis  :  [place] heparin  Lab Results  Component Value Date   PLT 258 05/16/2018    Antibiotics  :   Anti-infectives (From admission, onward)   Start     Dose/Rate Route Frequency Ordered Stop   05/15/18 0700  vancomycin (VANCOCIN) 1,250 mg in sodium chloride 0.9 % 250 mL IVPB  Status:  Discontinued     1,250 mg 166.7 mL/hr over 90 Minutes Intravenous Every 12 hours 05/14/18 1840 05/16/18 1233   05/14/18 1845  vancomycin (VANCOCIN) 2,000 mg in sodium chloride 0.9 % 500 mL IVPB  Status:  Discontinued     2,000 mg 250 mL/hr over 120 Minutes Intravenous  Once 05/14/18 1832 05/16/18 0840   05/14/18 1845  ceFEPIme (MAXIPIME) 2 g in sodium chloride 0.9 % 100 mL IVPB     2 g 200 mL/hr over 30 Minutes Intravenous Every 8 hours 05/14/18 1832     05/14/18 1545  piperacillin-tazobactam (ZOSYN) IVPB 3.375 g  Status:  Discontinued     3.375 g 100 mL/hr over 30 Minutes Intravenous  Once 05/14/18 1530 05/14/18 1832        Objective:   Vitals:   05/15/18 1941 05/16/18 0316 05/16/18 0425 05/16/18 0500  BP: (!) 146/78  129/75     Pulse: 98  97   Resp: 18  18   Temp: (!) 102 F (38.9 C) 99 F (37.2 C) 99.5 F (37.5 C)   TempSrc: Oral Oral Oral   SpO2: 97%  98%   Weight:    98.8 kg  Height:        Wt Readings from Last 3 Encounters:  05/16/18 98.8 kg  01/21/17 109 kg  10/11/16 105.7 kg     Intake/Output Summary (Last 24 hours) at 05/16/2018 1233 Last data filed at 05/16/2018 0817 Gross per 24 hour  Intake 1039.48 ml  Output 1930 ml  Net -890.52 ml     Physical Exam  Awake Alert, Oriented X 3, No new F.N deficits, Normal affect Symmetrical Chest wall movement, Good air movement bilaterally, CTAB RRR,No Gallops,Rubs or new Murmurs, No Parasternal Heave +ve B.Sounds, Abd Soft, No tenderness, No rebound - guarding or rigidity.y. left trans-metatarsal amputation, right foot with lateral wound with foul-smelling odor and discharge    Data Review:    CBC Recent Labs  Lab 05/14/18 1213 05/14/18 2003 05/15/18 0455 05/16/18 0343  WBC 5.6 8.2 6.8 5.8  HGB 14.5 15.1 13.9 12.6*  HCT 45.0 45.9 42.3 38.5*  PLT 315 317 259 258  MCV 84.7 84.4 84.8 84.6  MCH 27.3 27.8 27.9 27.7  MCHC 32.2 32.9 32.9 32.7  RDW 11.6 11.7 11.6 11.6  LYMPHSABS 1.1  --   --   --   MONOABS 0.5  --   --   --   EOSABS 0.1  --   --   --   BASOSABS 0.1  --   --   --     Chemistries  Recent Labs  Lab 05/14/18 1213 05/14/18 2003 05/15/18 0455 05/16/18 0343  NA 131*  --  132* 130*  K 4.7  --  4.3 4.3  CL 102  --  103 104  CO2 19*  --  18* 18*  GLUCOSE 438*  --  226* 358*  BUN 20  --  25* 23*  CREATININE 1.70* 1.83* 1.61* 1.49*  CALCIUM 8.7*  --  8.5* 8.0*  AST 13*  --  15  --   ALT 11  --  12  --   ALKPHOS 59  --  56  --   BILITOT 0.6  --  0.8  --    ------------------------------------------------------------------------------------------------------------------ No results for input(s): CHOL, HDL, LDLCALC, TRIG, CHOLHDL, LDLDIRECT in the last 72 hours.  Lab Results  Component Value Date   HGBA1C 15.9 (H)  05/14/2018   ------------------------------------------------------------------------------------------------------------------ No results for input(s): TSH, T4TOTAL, T3FREE, THYROIDAB in the last 72 hours.  Invalid input(s): FREET3 ------------------------------------------------------------------------------------------------------------------ No results for input(s): VITAMINB12, FOLATE, FERRITIN, TIBC, IRON, RETICCTPCT in the last 72 hours.  Coagulation profile Recent Labs  Lab 05/15/18 0455  INR 1.01    No results for input(s): DDIMER in the last 72 hours.  Cardiac Enzymes No results for input(s): CKMB, TROPONINI, MYOGLOBIN in the last 168 hours.  Invalid input(s): CK ------------------------------------------------------------------------------------------------------------------ No results found for: BNP  Inpatient Medications  Scheduled Meds: . chlorhexidine  60 mL Topical Once  . heparin  5,000 Units Subcutaneous Q8H  . insulin aspart  0-20 Units Subcutaneous Q4H  . insulin aspart  0-5 Units Subcutaneous QHS  . insulin aspart  3 Units Subcutaneous TID WC  . insulin detemir  15 Units Subcutaneous Daily   Continuous Infusions: . sodium chloride 75 mL/hr at 05/16/18 1011  . ceFEPime (MAXIPIME) IV 2 g (05/16/18 1014)   PRN Meds:.acetaminophen **OR** acetaminophen, bisacodyl, HYDROcodone-acetaminophen, senna-docusate  Micro Results Recent Results (from the past 240 hour(s))  Blood culture (routine x 2)     Status: None (Preliminary result)   Collection Time: 05/14/18  3:36 PM  Result Value Ref Range Status   Specimen Description BLOOD LEFT ARM  Final   Special Requests   Final    BOTTLES DRAWN AEROBIC ONLY Blood Culture results may not be optimal due to an inadequate volume of blood received in culture bottles   Culture   Final    NO GROWTH 2 DAYS Performed at [facility] [address], [place], [place] [address]    Report Status PENDING  Incomplete    Blood culture (routine x 2)     Status: None (Preliminary result)   Collection Time: 05/14/18  6:05 PM  Result Value Ref Range Status   Specimen Description BLOOD RIGHT HAND  Final   Special Requests   Final    BOTTLES DRAWN AEROBIC ONLY Blood Culture adequate volume   Culture  Setup Time   Final    GRAM POSITIVE COCCI IN CHAINS AEROBIC BOTTLE ONLY Organism ID to follow CRITICAL RESULT CALLED TO, READ BACK BY AND VERIFIED WITH: J. LEDFORD PHARMD, AT 2317 9/277/19 BY Rush Landmark Performed at [facility], [place] [address] [place], [place] [address]    Culture GRAM POSITIVE COCCI  Final   Report Status PENDING  Incomplete  Blood Culture ID Panel (Reflexed)     Status: Abnormal   Collection Time: 05/14/18  6:05 PM  Result Value Ref Range Status   Enterococcus species NOT DETECTED NOT DETECTED Final   Listeria monocytogenes NOT DETECTED NOT DETECTED Final   Staphylococcus species NOT DETECTED NOT  DETECTED Final   Staphylococcus aureus NOT DETECTED NOT DETECTED Final   Streptococcus species DETECTED (A) NOT DETECTED Final    Comment: Not Enterococcus species, Streptococcus agalactiae, Streptococcus pyogenes, or Streptococcus pneumoniae. CRITICAL RESULT CALLED TO, READ BACK BY AND VERIFIED WITH: J. LEDFORD PHARMD, AT 2317 9/277/19 BY D. VANHOOK    Streptococcus agalactiae NOT DETECTED NOT DETECTED Final   Streptococcus pneumoniae NOT DETECTED NOT DETECTED Final   Streptococcus pyogenes NOT DETECTED NOT DETECTED Final   Acinetobacter baumannii NOT DETECTED NOT DETECTED Final   Enterobacteriaceae species NOT DETECTED NOT DETECTED Final   Enterobacter cloacae complex NOT DETECTED NOT DETECTED Final   Escherichia coli NOT DETECTED NOT DETECTED Final   Klebsiella oxytoca NOT DETECTED NOT DETECTED Final   Klebsiella pneumoniae NOT DETECTED NOT DETECTED Final   Proteus species NOT DETECTED NOT DETECTED Final   Serratia marcescens NOT DETECTED NOT DETECTED Final   Haemophilus influenzae  NOT DETECTED NOT DETECTED Final   Neisseria meningitidis NOT DETECTED NOT DETECTED Final   Pseudomonas aeruginosa NOT DETECTED NOT DETECTED Final   Candida albicans NOT DETECTED NOT DETECTED Final   Candida glabrata NOT DETECTED NOT DETECTED Final   Candida krusei NOT DETECTED NOT DETECTED Final   Candida parapsilosis NOT DETECTED NOT DETECTED Final   Candida tropicalis NOT DETECTED NOT DETECTED Final    Comment: Performed at [facility], [place]. [address] [place], [place] [address]  Surgical pcr screen     Status: None   Collection Time: 05/15/18  6:54 PM  Result Value Ref Range Status   MRSA, PCR NEGATIVE NEGATIVE Final   Staphylococcus aureus NEGATIVE NEGATIVE Final    Comment: (NOTE) The Xpert SA Assay (FDA approved for NASAL specimens in patients 78 years of age and older), is one component of a comprehensive surveillance program. It is not intended to diagnose infection nor to guide or monitor treatment. Performed at [facility], [place] [address] [place], [place] [address]     Radiology Reports Mr Foot Right W Wo Contrast  Result Date: 05/14/2018 CLINICAL DATA:  47 year old male with history of diabetes and chronic right foot ulcer adjacent to the fifth toe presents with worsening erythema. Foot ulcer has been present for more than 6 months. EXAM: MRI OF THE RIGHT FOREFOOT WITHOUT AND WITH CONTRAST TECHNIQUE: Multiplanar, multisequence MR imaging of the right forefoot was performed before and after the administration of intravenous contrast. CONTRAST:  10 mL Gadavist COMPARISON:  Foot radiographs from the same day FINDINGS: Bones/Joint/Cartilage Marrow edema and subtle cortical bone loss involving the base of the right fifth proximal phalanx consistent with acute osteomyelitis. The first through fifth rays are otherwise unremarkable in appearance. Ligaments Noncontributory Muscles and Tendons No pyomyositis. Intact flexor and extensor tendons crossing the forefoot. Soft  tissues Moderate degree of inflammatory soft tissue swelling and edema involving the base of the right fifth toe with soft tissue ulceration. More generalized diffuse soft tissue swelling of the forefoot more so along the plantar and to a lesser degree dorsum of the forefoot. IMPRESSION: Soft tissue ulcer with marrow edema and subtle cortical bone loss involving the fifth toe and fifth proximal phalanx consistent with acute osteomyelitis. Electronically Signed   By: Ashley Royalty M.D.   On: 05/14/2018 17:30   Dg Foot Complete Right  Result Date: 05/14/2018 CLINICAL DATA:  Diabetic ulcer lateral right foot EXAM: RIGHT FOOT COMPLETE - 3+ VIEW COMPARISON:  10/16/2017 FINDINGS: Soft tissue ulcer noted over the right 5th MTP joint.  Lucency noted at the base of the right 5th toe proximal phalanx could reflect fracture or osteomyelitis. No additional acute bony abnormality. IMPRESSION: Soft tissue defect over the right 5th metatarsal compatible with soft tissue ulcer. Lucency at the base of the right 5th toe proximal phalanx could reflect fracture or osteomyelitis. Electronically Signed   By: Rolm Baptise M.D.   On: 05/14/2018 12:54     Phillips Climes M.D on 05/16/2018 at 12:33 PM  Between 7am to 7pm - Pager - [phone]  After 7pm go to [email or web address] - password [email or web address]  [facility] -  Office  [phone]

## 2018-05-16 NOTE — Anesthesia Postprocedure Evaluation (Signed)
Anesthesia Post Note  Patient: Edward [name]  Procedure(s) Performed: PARTIAL RAY AMPUTATION FIFTH TOE RIGHT FOOT (Right Foot)     Patient location during evaluation: PACU Anesthesia Type: MAC Level of consciousness: awake and alert Pain management: pain level controlled Vital Signs Assessment: post-procedure vital signs reviewed and stable Respiratory status: spontaneous breathing, nonlabored ventilation, respiratory function stable and patient connected to nasal cannula oxygen Cardiovascular status: stable and blood pressure returned to baseline Postop Assessment: no apparent nausea or vomiting Anesthetic complications: no    Last Vitals:  Vitals:   05/16/18 1636 05/16/18 1658  BP: 101/68 100/69  Pulse: 93 95  Resp: (!) 9 16  Temp:  36.8 C  SpO2: 98% 100%    Last Pain:  Vitals:   05/16/18 1658  TempSrc: Oral  PainSc: 0-No pain                 Tiajuana Amass

## 2018-05-16 NOTE — Transfer of Care (Signed)
Immediate Anesthesia Transfer of Care Note  Patient: Edward [name]  Procedure(s) Performed: PARTIAL RAY AMPUTATION FIFTH TOE RIGHT FOOT (Right Foot)  Patient Location: PACU  Anesthesia Type:MAC  Level of Consciousness: awake, alert  and oriented  Airway & Oxygen Therapy: Patient Spontanous Breathing  Post-op Assessment: Report given to RN and Post -op Vital signs reviewed and stable  Post vital signs: Reviewed and stable  Last Vitals:  Vitals Value Taken Time  BP 104/63 05/16/2018  4:07 PM  Temp    Pulse 93 05/16/2018  4:18 PM  Resp 15 05/16/2018  4:18 PM  SpO2 99 % 05/16/2018  4:18 PM  Vitals shown include unvalidated device data.  Last Pain:  Vitals:   05/16/18 1407  TempSrc: Oral  PainSc:          Complications: No apparent anesthesia complications

## 2018-05-16 NOTE — Plan of Care (Signed)

## 2018-05-16 NOTE — Anesthesia Procedure Notes (Signed)
Procedure Name: MAC Date/Time: 05/16/2018 2:54 PM Performed by: Sammie Bench, CRNA Pre-anesthesia Checklist: Patient identified, Emergency Drugs available, Suction available and Patient being monitored Patient Re-evaluated:Patient Re-evaluated prior to induction Oxygen Delivery Method: Simple face mask Preoxygenation: Pre-oxygenation with 100% oxygen Induction Type: IV induction Dental Injury: Teeth and Oropharynx as per pre-operative assessment

## 2018-05-16 NOTE — Op Note (Signed)
OPERATIVE REPORT Patient name: Edward [name] MRN: [id number] DOB: 1970/12/09  DOS:  05/16/2018   Preop Dx: osteomyelitis fifth toe right foot Postop Dx: same  Procedure:  1. Partial fifth ray amputation right foot  Surgeon: [name] DPM  Anesthesia: 50-50 mixture of 2% lidocaine plain with 0.5% Marcaine plain totaling 2mL infiltrated in the patient's RT lower extremity  Hemostasis: Ankle tourniquet inflated to a pressure of 234mmHg without esmarch exsanguination   EBL: 10 mL Materials: Provena negative pressure wound vac Injectables: none Pathology: cultures  Condition: The patient tolerated the procedure and anesthesia well. No complications noted or reported   Justification for procedure: The patient is a 47 y.o. male with uncontrolled DM and signs of sepsis who presents today for surgical correction of acute osteomyelitis to the RT fifth toe as well as cellulitis RT foot. All conservative modalities of been unsuccessful in providing any sort of satisfactory alleviation of symptoms with the patient. The patient was told benefits as well as possible side effects of the surgery. The patient consented for surgical correction. The patient consent form was reviewed. All patient questions were answered. No guarantees were expressed or implied. The patient and the surgeon boson the patient consent form with the witness present and placed in the patient's chart.  Procedure in Detail: The patient was brought to the operating room, placed in the operating table in the supine position at which time an aseptic scrub and drape were performed about the patient's respective lower extremity after anesthesia was induced as described above. Attention was then directed to the surgical area where procedure number one commenced.  Procedure #1: Partial 5th ray amputation RT foot  Racquet type incision was planned and made about the fifth metatarsal phalangeal joint of the right foot.  Incision  was carried down to the level of bone and joint capsule with care taken to cut clamp ligate to retract well small neurovascular structures traversing the incision site.  The fifth toe was grasped with a perforating towel clamp and disarticulated at the metatarsal phalangeal joint and removed in toto and placed in sterile specimen container.  At this time deep wound cultures were taken.  Additional dissection was performed using a surgical #15 blade to allow for exposure of the fifth metatarsal.  Osteotomy was performed to the diaphysis of the fifth metatarsal and a dorsal distal to proximal orientation with a slight obliquity.  The distal portion of the metatarsal was removed in toto.  Copious irrigation was utilized and all necrotic soft tissue was sharply dissected down to healthier tissue.  Primary closure was obtained and achieved using stainless steel skin staples to the proximal portion of the incision site.  There was not enough adequate tissue to primarily close the distal portion of the incision site.  A Provena negative pressure wound VAC was applied to the open wound area as well as the incision site.    Dry sterile compressive dressings were then applied to all previously mentioned incision sites about the patient's lower extremity. The tourniquet which was used for hemostasis was deflated. All normal neurovascular responses including pink color and warmth returned all remaining digits of patient's lower extremity.  The patient was then transferred from the operating room to the recovery room having tolerated the procedure and anesthesia well. All vital signs are stable. After a brief stay in the recovery room the patient was readmitted as inpatient with adequate prescriptions for analgesia. The patient is to keep the dressings clean dry  and intact until they are to follow surgeon Dr. Daylene Katayama in the office upon discharge, or as directed otherwise.  [name], DPM [facility]  Dr. [name], [place]                                        [place], [place] [address]                Office [phone]  Fax [phone]

## 2018-05-16 NOTE — Progress Notes (Signed)
Pt's oral temp at 12:32=102.30F, Pt had chills, provided warm blankets, adjusted room temp, rechecked temp at 12:56 103F.no chills, MD notified, new orders received and carried out. Will continue to monitor.

## 2018-05-16 NOTE — Progress Notes (Signed)
Orthopedic Tech Progress Note Patient Details:  Edward [name] [birth year] [id number]  Ortho Devices Type of Ortho Device: CAM walker Ortho Device/Splint Interventions: Ordered  as ordered by Dr. Venda Rodes, [name] 05/16/2018, 3:38 PM

## 2018-05-16 NOTE — Interval H&P Note (Signed)
History and Physical Interval Note:  05/16/2018 2:53 PM  Edward [name]  has presented today for surgery, with the diagnosis of OSTEOMYLITIS 5TH TOE RIGHT FOOT  The various methods of treatment have been discussed with the patient and family. After consideration of risks, benefits and other options for treatment, the patient has consented to  Procedure(s): PARTIAL RAY AMPUTATION FIFTH TOE RIGHT FOOT (Right) as a surgical intervention .  The patient's history has been reviewed, patient examined, no change in status, stable for surgery.  I have reviewed the patient's chart and labs.  Questions were answered to the patient's satisfaction.     [name]

## 2018-05-16 NOTE — Brief Op Note (Signed)
05/16/2018  4:02 PM  PATIENT:  Edward [name]  47 y.o. male  PRE-OPERATIVE DIAGNOSIS:  OSTEOMYLITIS 5TH TOE RIGHT FOOT  POST-OPERATIVE DIAGNOSIS:  OSTEOMYLITIS 5TH TOE RIGHT FOOT  PROCEDURE:  Procedure(s): PARTIAL RAY AMPUTATION FIFTH TOE RIGHT FOOT (Right)  SURGEON:  Surgeon(s) and Role:    [name], DPM - Primary  PHYSICIAN ASSISTANT:   ASSISTANTS: none   ANESTHESIA:   local and IV sedation  EBL:  5 mL   BLOOD ADMINISTERED:none  DRAINS: none   LOCAL MEDICATIONS USED:  MARCAINE    and LIDOCAINE   SPECIMEN:  No Specimen  DISPOSITION OF SPECIMEN:  N/A  COUNTS:  YES  TOURNIQUET:   Total Tourniquet Time Documented: Calf (Right) - 39 minutes Total: Calf (Right) - 39 minutes   DICTATION: .Viviann Spare Dictation  PLAN OF CARE: Admit to inpatient   PATIENT DISPOSITION:  PACU - hemodynamically stable.   Delay start of Pharmacological VTE agent (>24hrs) due to surgical blood loss or risk of bleeding: no

## 2018-05-16 NOTE — Anesthesia Preprocedure Evaluation (Addendum)
Anesthesia Evaluation  Patient identified by MRN, date of birth, ID band Patient awake    Reviewed: Allergy & Precautions, NPO status , Patient's Chart, lab work & pertinent test results  Airway Mallampati: II  TM Distance: >3 FB     Dental   Pulmonary Current Smoker,    breath sounds clear to auscultation       Cardiovascular hypertension,  Rhythm:Regular Rate:Normal     Neuro/Psych negative neurological ROS     GI/Hepatic negative GI ROS, Neg liver ROS,   Endo/Other  diabetes, Type 2, Insulin Dependent  Renal/GU Renal InsufficiencyRenal disease     Musculoskeletal  (+) Arthritis ,   Abdominal   Peds  Hematology  (+) anemia ,   Anesthesia Other Findings   Reproductive/Obstetrics                             Lab Results  Component Value Date   WBC 5.8 05/16/2018   HGB 12.6 (L) 05/16/2018   HCT 38.5 (L) 05/16/2018   MCV 84.6 05/16/2018   PLT 258 05/16/2018   Lab Results  Component Value Date   CREATININE 1.49 (H) 05/16/2018   BUN 23 (H) 05/16/2018   NA 130 (L) 05/16/2018   K 4.3 05/16/2018   CL 104 05/16/2018   CO2 18 (L) 05/16/2018    Anesthesia Physical Anesthesia Plan  ASA: III  Anesthesia Plan: MAC   Post-op Pain Management:    Induction: Intravenous  PONV Risk Score and Plan: 0 and Propofol infusion, Ondansetron and Treatment may vary due to age or medical condition  Airway Management Planned: Natural Airway and Simple Face Mask  Additional Equipment:   Intra-op Plan:   Post-operative Plan:   Informed Consent: I have reviewed the patients History and Physical, chart, labs and discussed the procedure including the risks, benefits and alternatives for the proposed anesthesia with the patient or authorized representative who has indicated his/her understanding and acceptance.     Plan Discussed with: CRNA  Anesthesia Plan Comments:         Anesthesia  Quick Evaluation

## 2018-05-17 LAB — CBC
HCT: 36.1 % — ABNORMAL LOW (ref 39.0–52.0)
HEMOGLOBIN: 11.5 g/dL — AB (ref 13.0–17.0)
MCH: 27.1 pg (ref 26.0–34.0)
MCHC: 31.9 g/dL (ref 30.0–36.0)
MCV: 85.1 fL (ref 78.0–100.0)
Platelets: 201 10*3/uL (ref 150–400)
RBC: 4.24 MIL/uL (ref 4.22–5.81)
RDW: 11.8 % (ref 11.5–15.5)
WBC: 6 10*3/uL (ref 4.0–10.5)

## 2018-05-17 LAB — BASIC METABOLIC PANEL
Anion gap: 7 (ref 5–15)
BUN: 19 mg/dL (ref 6–20)
CHLORIDE: 109 mmol/L (ref 98–111)
CO2: 17 mmol/L — AB (ref 22–32)
CREATININE: 1.34 mg/dL — AB (ref 0.61–1.24)
Calcium: 8 mg/dL — ABNORMAL LOW (ref 8.9–10.3)
GFR calc Af Amer: >60 mL/min (ref >60)
GFR calc non Af Amer: >60 mL/min (ref >60)
GLUCOSE: 217 mg/dL — AB (ref 70–99)
Potassium: 4.4 mmol/L (ref 3.5–5.1)
Sodium: 133 mmol/L — ABNORMAL LOW (ref 135–145)

## 2018-05-17 LAB — CULTURE, BLOOD (ROUTINE X 2): Special Requests: ADEQUATE

## 2018-05-17 LAB — GLUCOSE, CAPILLARY
GLUCOSE-CAPILLARY: 150 mg/dL — AB (ref 70–99)
GLUCOSE-CAPILLARY: 235 mg/dL — AB (ref 70–99)
Glucose-Capillary: 223 mg/dL — ABNORMAL HIGH (ref 70–99)
Glucose-Capillary: 219 mg/dL — ABNORMAL HIGH (ref 70–99)
Glucose-Capillary: 198 mg/dL — ABNORMAL HIGH (ref 70–99)
Glucose-Capillary: 211 mg/dL — ABNORMAL HIGH (ref 70–99)

## 2018-05-17 MED ORDER — CEFAZOLIN SODIUM-DEXTROSE 2-4 GM/100ML-% IV SOLN
2.0000 g | Freq: Three times a day (TID) | INTRAVENOUS | Status: DC
  Administered 2018-05-17 – 2018-05-19 (×7): 2 g via INTRAVENOUS
  Filled 2018-05-17 (×7): qty 100

## 2018-05-17 MED ORDER — INSULIN DETEMIR 100 UNIT/ML ~~LOC~~ SOLN
7.0000 [IU] | SUBCUTANEOUS | Status: AC
  Administered 2018-05-17: 7 [IU] via SUBCUTANEOUS
  Filled 2018-05-17: qty 0.07

## 2018-05-17 MED ORDER — INSULIN DETEMIR 100 UNIT/ML ~~LOC~~ SOLN
22.0000 [IU] | Freq: Every day | SUBCUTANEOUS | Status: DC
  Administered 2018-05-18: 22 [IU] via SUBCUTANEOUS
  Filled 2018-05-17 (×2): qty 0.22

## 2018-05-17 MED ORDER — CEFAZOLIN SODIUM-DEXTROSE 2-4 GM/100ML-% IV SOLN: 2.0000 g | Freq: Three times a day (TID) | INTRAVENOUS | Status: DC

## 2018-05-17 NOTE — Progress Notes (Signed)
Pharmacy Antibiotic Note  Edward [name] is a 47 y.o. male admitted on 05/14/2018 with bacteremia.  Pharmacy has been consulted for Cefazolin dosing.  Plan: Cefazolin 2g Q8hrs F/u clinical status, LOT, cx sensitivities    Height: 6\' 1"  (185.4 cm) Weight: 217 lb 13 oz (98.8 kg) IBW/kg (Calculated) : 79.9  Temp (24hrs), Avg:100.3 F (37.9 C), Min:98.3 F (36.8 C), Max:103 F (39.4 C)  Recent Labs  Lab 05/14/18 1213 05/14/18 2003 05/15/18 0455 05/16/18 0343 05/17/18 0732  WBC 5.6 8.2 6.8 5.8 6.0  CREATININE 1.70* 1.83* 1.61* 1.49* 1.34*    Estimated Creatinine Clearance: 83.4 mL/min (A) (by C-G formula based on SCr of 1.34 mg/dL (H)).    No Known Allergies  Antimicrobials this admission: Vanc 9/26 >>9/28 Cefepime 9/26 >>9/29 Cefazolin 9/29>>  Dose adjustments this admission:   Microbiology results: 9/26 BCx: 1/2 viridans strep Wound cx: no organisms   Thank you for allowing pharmacy to be a part of this patient's care.  Juanell Fairly, PharmD PGY1 Pharmacy Resident Phone [phone] 05/17/2018 11:31 AM

## 2018-05-17 NOTE — Plan of Care (Signed)
  Problem: Activity: Goal: Risk for activity intolerance will decrease Outcome: Progressing   Problem: Coping: Goal: Level of anxiety will decrease Outcome: Progressing   Problem: Pain Managment: Goal: General experience of comfort will improve Outcome: Progressing   Problem: Safety: Goal: Ability to remain free from injury will improve Outcome: Progressing   Problem: Skin Integrity: Goal: Risk for impaired skin integrity will decrease Outcome: Progressing   

## 2018-05-17 NOTE — Progress Notes (Signed)
   Patient seen this evening status post partial fifth ray amputation to the right foot.  Patient states that he is doing well.  Denies pain.  Denies fever and chills this evening.  Patient resting comfortably in bed.  Evaluation of the foot has the negative pressure wound VAC intact.  Negative pressure maintained at continuous 125 mgHg.   Intraoperatively the soft tissue appeared healthy and viable after amputation and debridement of the necrotic tissue.  I feel confident that the osteomyelitis and infected tissue was removed completely based on intraoperative evaluation.  Partial closure achieved with standstill skin staples and the open portion of the amputation site was dressed with Provena Wound Vac.   Assessment: - s/p Partial fifth ray amputation right foot secondary to osteomyelitis and diabetic foot ulcer. DOS 05/16/18 - DM II, uncontrolled - Cellulitis RT foot  Plan of care: - The plan is to leave the wound VAC intact for 1 week postoperatively.  Patient can go home with the home Provena Vac. Leave dressings clean, dry, and intact for 1 week.  - Patient could likely go home with oral antibiotics based on cultures and sensitivities.  Based on intraoperative evaluation and findings I do not feel the need for long-term IV antibiotics - Weightbearing in an immobilization cam boot - Podiatry will sign off at this time.  Follow-up in the office within 1 week of discharge.  [name], DPM [facility]  Dr. [name], DPM    [address], [place] [address]                Office [phone]  Fax [phone]

## 2018-05-17 NOTE — Progress Notes (Signed)
PROGRESS NOTE                                                                                                                                                                                                             Patient Demographics:    Edward [name], is a 47 y.o. male, DOB - 1971/02/04, GYB:[id number]  Admit date - 05/14/2018   Admitting Physician Ankit Arsenio Loader, MD  Outpatient Primary MD for the patient is [name], MD  LOS - 3   Chief Complaint  Patient presents with  . Foot Pain       Brief Narrative    48 y.o. male with medical history significant of with past medical history of uncontrolled diabetes mellitus type 2 and lower extremity wound comes to the hospital for evaluation of opening of the right lower extremity wound.  His work-up significant for sepsis secondary to left fifth metatarsal osteomyelitis, went for 3 amputation by podiatry 05/16/2018, , work-up significant for Streptococcus bacteremia.   Subjective:    Edward [name] today has, No headache, No chest pain, No abdominal pain - No Nausea or vomiting, and had fever of 103 yesterday  Assessment  & Plan :    Active Problems:   Diabetic osteomyelitis ([place])   Osteomyelitis of right foot ([facility])   DM2 (diabetes mellitus, type 2) ([place])   Sepsis secondary to right lower extremity wound infection with left fifth metatarsal osteomyelitis . -Presents with fever, tachycardia, leukocytosis, and worsening renal function, sepsis criteria met on admission. -Secondary to left foot infection, MRI with evidence of fifth metatarsal bone to myelitis, he has wound with foul-smelling order, podiatry input greatly appreciated, went for partial fifth ray amputation of right foot by dietary on 05/16/2018 -Broad-spectrum antibiotic vancomycin and cefepime on admission, IV vancomycin has been stopped initially given Streptococcus bacteremia, now blood culture came back for Streptococcus  viridans, I have discussed with ID, recommendation to continue for IV cefazolin during hospital stay, and will follow on final sensitivities about outpatient discharge regimen, likely will need total of 2 weeks treatment.  Streptococcus bacteremia -Please see above discussion, follow on surveillance blood culture obtained 05/17/2018  Poorly controlled type 2 diabetes mellitus -Patient reports he is not compliant with diet,not  fully compliant with medications, -Ruben A1c is 15.9 -CBG are uncontrolled, I will increase his Levemir from 15  to 22 units, continue with insulin sliding scale .   AKI on CKD stage III -Baseline creatinine 1.5, peaked at 1.8, improving with hydration, consider ACE and ARB before discharge.      Code Status : full  Family Communication  : None at bedside  Disposition Plan  : Home when stable  Consults  :  Podiatry  Procedures  : none  DVT Prophylaxis  :  [place] heparin  Lab Results  Component Value Date   PLT 201 05/17/2018    Antibiotics  :   Anti-infectives (From admission, onward)   Start     Dose/Rate Route Frequency Ordered Stop   05/15/18 0700  vancomycin (VANCOCIN) 1,250 mg in sodium chloride 0.9 % 250 mL IVPB  Status:  Discontinued     1,250 mg 166.7 mL/hr over 90 Minutes Intravenous Every 12 hours 05/14/18 1840 05/16/18 1233   05/14/18 1845  vancomycin (VANCOCIN) 2,000 mg in sodium chloride 0.9 % 500 mL IVPB  Status:  Discontinued     2,000 mg 250 mL/hr over 120 Minutes Intravenous  Once 05/14/18 1832 05/16/18 0840   05/14/18 1845  ceFEPIme (MAXIPIME) 2 g in sodium chloride 0.9 % 100 mL IVPB  Status:  Discontinued     2 g 200 mL/hr over 30 Minutes Intravenous Every 8 hours 05/14/18 1832 05/17/18 1100   05/14/18 1545  piperacillin-tazobactam (ZOSYN) IVPB 3.375 g  Status:  Discontinued     3.375 g 100 mL/hr over 30 Minutes Intravenous  Once 05/14/18 1530 05/14/18 1832        Objective:   Vitals:   05/16/18 1658 05/16/18 1938 05/17/18  0013 05/17/18 0412  BP: 100/69 103/73 125/83 (!) 153/91  Pulse: 95 96 95 100  Resp: 16 18 17 18   Temp: 98.3 F (36.8 C) 98.4 F (36.9 C) 98.6 F (37 C) 98.7 F (37.1 C)  TempSrc: Oral Oral Oral Oral  SpO2: 100% 98% 99% 99%  Weight:      Height:        Wt Readings from Last 3 Encounters:  05/16/18 98.8 kg  01/21/17 109 kg  10/11/16 105.7 kg     Intake/Output Summary (Last 24 hours) at 05/17/2018 1102 Last data filed at 05/17/2018 0807 Gross per 24 hour  Intake 625 ml  Output 1235 ml  Net -610 ml     Physical Exam  Awake Alert, Oriented X 3, No new F.N deficits, Normal affect Symmetrical Chest wall movement, Good air movement bilaterally, CTAB RRR,No Gallops,Rubs or new Murmurs, No Parasternal Heave +ve B.Sounds, Abd Soft, No tenderness, No rebound - guarding or rigidity. Old left transmetatarsal amputation, right foot status post chest x-ray amputation currently on wound VAC with minimal with an VAC container.     Data Review:    CBC Recent Labs  Lab 05/14/18 1213 05/14/18 2003 05/15/18 0455 05/16/18 0343 05/17/18 0732  WBC 5.6 8.2 6.8 5.8 6.0  HGB 14.5 15.1 13.9 12.6* 11.5*  HCT 45.0 45.9 42.3 38.5* 36.1*  PLT 315 317 259 258 201  MCV 84.7 84.4 84.8 84.6 85.1  MCH 27.3 27.8 27.9 27.7 27.1  MCHC 32.2 32.9 32.9 32.7 31.9  RDW 11.6 11.7 11.6 11.6 11.8  LYMPHSABS 1.1  --   --   --   --   MONOABS 0.5  --   --   --   --   EOSABS 0.1  --   --   --   --   BASOSABS 0.1  --   --   --   --  Chemistries  Recent Labs  Lab 05/14/18 1213 05/14/18 2003 05/15/18 0455 05/16/18 0343 05/17/18 0732  NA 131*  --  132* 130* 133*  K 4.7  --  4.3 4.3 4.4  CL 102  --  103 104 109  CO2 19*  --  18* 18* 17*  GLUCOSE 438*  --  226* 358* 217*  BUN 20  --  25* 23* 19  CREATININE 1.70* 1.83* 1.61* 1.49* 1.34*  CALCIUM 8.7*  --  8.5* 8.0* 8.0*  AST 13*  --  15  --   --   ALT 11  --  12  --   --   ALKPHOS 59  --  56  --   --   BILITOT 0.6  --  0.8  --   --     ------------------------------------------------------------------------------------------------------------------ No results for input(s): CHOL, HDL, LDLCALC, TRIG, CHOLHDL, LDLDIRECT in the last 72 hours.  Lab Results  Component Value Date   HGBA1C 15.9 (H) 05/14/2018   ------------------------------------------------------------------------------------------------------------------ No results for input(s): TSH, T4TOTAL, T3FREE, THYROIDAB in the last 72 hours.  Invalid input(s): FREET3 ------------------------------------------------------------------------------------------------------------------ No results for input(s): VITAMINB12, FOLATE, FERRITIN, TIBC, IRON, RETICCTPCT in the last 72 hours.  Coagulation profile Recent Labs  Lab 05/15/18 0455  INR 1.01    No results for input(s): DDIMER in the last 72 hours.  Cardiac Enzymes No results for input(s): CKMB, TROPONINI, MYOGLOBIN in the last 168 hours.  Invalid input(s): CK ------------------------------------------------------------------------------------------------------------------ No results found for: BNP  Inpatient Medications  Scheduled Meds: . heparin  5,000 Units Subcutaneous Q8H  . insulin aspart  0-20 Units Subcutaneous Q4H  . insulin aspart  0-5 Units Subcutaneous QHS  . insulin aspart  3 Units Subcutaneous TID WC  . [START ON 05/18/2018] insulin detemir  22 Units Subcutaneous Daily  . insulin detemir  7 Units Subcutaneous NOW  . sodium chloride flush  3 mL Intravenous Q12H   Continuous Infusions: . sodium chloride 75 mL/hr at 05/17/18 1021  . sodium chloride    . lactated ringers 10 mL/hr at 05/16/18 1433   PRN Meds:.sodium chloride, acetaminophen **OR** acetaminophen, bisacodyl, HYDROcodone-acetaminophen, oxyCODONE-acetaminophen, promethazine, senna-docusate, sodium chloride flush  Micro Results Recent Results (from the past 240 hour(s))  Blood culture (routine x 2)     Status: None (Preliminary  result)   Collection Time: 05/14/18  3:36 PM  Result Value Ref Range Status   Specimen Description BLOOD LEFT ARM  Final   Special Requests   Final    BOTTLES DRAWN AEROBIC ONLY Blood Culture results may not be optimal due to an inadequate volume of blood received in culture bottles   Culture   Final    NO GROWTH 2 DAYS Performed at [facility] [address], [place], [place] [address]    Report Status PENDING  Incomplete  Blood culture (routine x 2)     Status: Abnormal   Collection Time: 05/14/18  6:05 PM  Result Value Ref Range Status   Specimen Description BLOOD RIGHT HAND  Final   Special Requests   Final    BOTTLES DRAWN AEROBIC ONLY Blood Culture adequate volume   Culture  Setup Time   Final    GRAM POSITIVE COCCI IN CHAINS AEROBIC BOTTLE ONLY CRITICAL RESULT CALLED TO, READ BACK BY AND VERIFIED WITH: J. LEDFORD PHARMD, AT 2317 9/277/19 BY D. VANHOOK    Culture (A)  Final    VIRIDANS STREPTOCOCCUS THE SIGNIFICANCE OF ISOLATING THIS ORGANISM FROM A SINGLE SET OF  BLOOD CULTURES WHEN MULTIPLE SETS ARE DRAWN IS UNCERTAIN. PLEASE NOTIFY THE MICROBIOLOGY DEPARTMENT WITHIN ONE WEEK IF SPECIATION AND SENSITIVITIES ARE REQUIRED. Performed at [facility], [place] [address], [place], [place] [address]    Report Status 05/17/2018 FINAL  Final  Blood Culture ID Panel (Reflexed)     Status: Abnormal   Collection Time: 05/14/18  6:05 PM  Result Value Ref Range Status   Enterococcus species NOT DETECTED NOT DETECTED Final   Listeria monocytogenes NOT DETECTED NOT DETECTED Final   Staphylococcus species NOT DETECTED NOT DETECTED Final   Staphylococcus aureus NOT DETECTED NOT DETECTED Final   Streptococcus species DETECTED (A) NOT DETECTED Final    Comment: Not Enterococcus species, Streptococcus agalactiae, Streptococcus pyogenes, or Streptococcus pneumoniae. CRITICAL RESULT CALLED TO, READ BACK BY AND VERIFIED WITH: J. LEDFORD PHARMD, AT 2317 9/277/19 BY D. VANHOOK     Streptococcus agalactiae NOT DETECTED NOT DETECTED Final   Streptococcus pneumoniae NOT DETECTED NOT DETECTED Final   Streptococcus pyogenes NOT DETECTED NOT DETECTED Final   Acinetobacter baumannii NOT DETECTED NOT DETECTED Final   Enterobacteriaceae species NOT DETECTED NOT DETECTED Final   Enterobacter cloacae complex NOT DETECTED NOT DETECTED Final   Escherichia coli NOT DETECTED NOT DETECTED Final   Klebsiella oxytoca NOT DETECTED NOT DETECTED Final   Klebsiella pneumoniae NOT DETECTED NOT DETECTED Final   Proteus species NOT DETECTED NOT DETECTED Final   Serratia marcescens NOT DETECTED NOT DETECTED Final   Haemophilus influenzae NOT DETECTED NOT DETECTED Final   Neisseria meningitidis NOT DETECTED NOT DETECTED Final   Pseudomonas aeruginosa NOT DETECTED NOT DETECTED Final   Candida albicans NOT DETECTED NOT DETECTED Final   Candida glabrata NOT DETECTED NOT DETECTED Final   Candida krusei NOT DETECTED NOT DETECTED Final   Candida parapsilosis NOT DETECTED NOT DETECTED Final   Candida tropicalis NOT DETECTED NOT DETECTED Final    Comment: Performed at [facility], [place]. [address] [place], [place] [address]  Surgical pcr screen     Status: None   Collection Time: 05/15/18  6:54 PM  Result Value Ref Range Status   MRSA, PCR NEGATIVE NEGATIVE Final   Staphylococcus aureus NEGATIVE NEGATIVE Final    Comment: (NOTE) The Xpert SA Assay (FDA approved for NASAL specimens in patients 67 years of age and older), is one component of a comprehensive surveillance program. It is not intended to diagnose infection nor to guide or monitor treatment. Performed at [facility], [place] [address], [place], [place] [address]   Aerobic/Anaerobic Culture (surgical/deep wound)     Status: None (Preliminary result)   Collection Time: 05/16/18  3:40 PM  Result Value Ref Range Status   Specimen Description WOUND RIGHT FOOT  Final   Special Requests PT ON MAXIPIME  Final   Gram Stain    Final    NO WBC SEEN NO ORGANISMS SEEN Performed at [facility], [address] [place], [place] [address]    Culture PENDING  Incomplete   Report Status PENDING  Incomplete    Radiology Reports Mr Foot Right W Wo Contrast  Result Date: 05/14/2018 CLINICAL DATA:  47 year old male with history of diabetes and chronic right foot ulcer adjacent to the fifth toe presents with worsening erythema. Foot ulcer has been present for more than 6 months. EXAM: MRI OF THE RIGHT FOREFOOT WITHOUT AND WITH CONTRAST TECHNIQUE: Multiplanar, multisequence MR imaging of the right forefoot was performed before and after the administration of intravenous  contrast. CONTRAST:  10 mL Gadavist COMPARISON:  Foot radiographs from the same day FINDINGS: Bones/Joint/Cartilage Marrow edema and subtle cortical bone loss involving the base of the right fifth proximal phalanx consistent with acute osteomyelitis. The first through fifth rays are otherwise unremarkable in appearance. Ligaments Noncontributory Muscles and Tendons No pyomyositis. Intact flexor and extensor tendons crossing the forefoot. Soft tissues Moderate degree of inflammatory soft tissue swelling and edema involving the base of the right fifth toe with soft tissue ulceration. More generalized diffuse soft tissue swelling of the forefoot more so along the plantar and to a lesser degree dorsum of the forefoot. IMPRESSION: Soft tissue ulcer with marrow edema and subtle cortical bone loss involving the fifth toe and fifth proximal phalanx consistent with acute osteomyelitis. Electronically Signed   By: Ashley Royalty M.D.   On: 05/14/2018 17:30   Dg Foot Complete Right  Result Date: 05/16/2018 CLINICAL DATA:  Status post amputation EXAM: RIGHT FOOT COMPLETE - 3+ VIEW COMPARISON:  MRI 05/14/2018, radiograph 05/14/2018, 10/16/2017 FINDINGS: Interval fifth ray amputation at the level of the proximal fifth metatarsal. Cutaneous staples and drain over the lateral aspect  of the foot. Cut margin is smooth. No acute fracture or malalignment. No radiopaque foreign body. IMPRESSION: Interval amputation of fifth digit at the level of the proximal fifth metatarsal with expected postsurgical changes Electronically Signed   By: Donavan Foil M.D.   On: 05/16/2018 18:21   Dg Foot Complete Right  Result Date: 05/14/2018 CLINICAL DATA:  Diabetic ulcer lateral right foot EXAM: RIGHT FOOT COMPLETE - 3+ VIEW COMPARISON:  10/16/2017 FINDINGS: Soft tissue ulcer noted over the right 5th MTP joint. Lucency noted at the base of the right 5th toe proximal phalanx could reflect fracture or osteomyelitis. No additional acute bony abnormality. IMPRESSION: Soft tissue defect over the right 5th metatarsal compatible with soft tissue ulcer. Lucency at the base of the right 5th toe proximal phalanx could reflect fracture or osteomyelitis. Electronically Signed   By: Rolm Baptise M.D.   On: 05/14/2018 12:54     Phillips Climes M.D on 05/17/2018 at 11:02 AM  Between 7am to 7pm - Pager - [phone]  After 7pm go to [email or web address] - password [email or web address]  [facility] -  Office  [phone]

## 2018-05-17 NOTE — Plan of Care (Signed)
  Problem: Education: Goal: Knowledge of General Education information will improve Description Including pain rating scale, medication(s)/side effects and non-pharmacologic comfort measures Outcome: Progressing   Problem: Health Behavior/Discharge Planning: Goal: Ability to manage health-related needs will improve Outcome: Progressing   Problem: Clinical Measurements: Goal: Ability to maintain clinical measurements within normal limits will improve Outcome: Progressing Goal: Will remain free from infection Outcome: Progressing   Problem: Activity: Goal: Risk for activity intolerance will decrease Outcome: Progressing   Problem: Nutrition: Goal: Adequate nutrition will be maintained Outcome: Progressing   Problem: Coping: Goal: Level of anxiety will decrease Outcome: Progressing   Problem: Elimination: Goal: Will not experience complications related to bowel motility Outcome: Progressing Goal: Will not experience complications related to urinary retention Outcome: Progressing   Problem: Pain Managment: Goal: General experience of comfort will improve Outcome: Progressing   Problem: Safety: Goal: Ability to remain free from injury will improve Outcome: Progressing   Problem: Skin Integrity: Goal: Risk for impaired skin integrity will decrease Outcome: Progressing   

## 2018-05-18 LAB — GLUCOSE, CAPILLARY
GLUCOSE-CAPILLARY: 159 mg/dL — AB (ref 70–99)
GLUCOSE-CAPILLARY: 99 mg/dL (ref 70–99)
Glucose-Capillary: 100 mg/dL — ABNORMAL HIGH (ref 70–99)
Glucose-Capillary: 107 mg/dL — ABNORMAL HIGH (ref 70–99)
Glucose-Capillary: 140 mg/dL — ABNORMAL HIGH (ref 70–99)
Glucose-Capillary: 198 mg/dL — ABNORMAL HIGH (ref 70–99)

## 2018-05-18 LAB — BASIC METABOLIC PANEL
Anion gap: 5 (ref 5–15)
BUN: 14 mg/dL (ref 6–20)
CHLORIDE: 110 mmol/L (ref 98–111)
CO2: 20 mmol/L — AB (ref 22–32)
CREATININE: 1.2 mg/dL (ref 0.61–1.24)
Calcium: 8 mg/dL — ABNORMAL LOW (ref 8.9–10.3)
GFR calc non Af Amer: >60 mL/min (ref >60)
Glucose, Bld: 96 mg/dL (ref 70–99)
POTASSIUM: 4 mmol/L (ref 3.5–5.1)
Sodium: 135 mmol/L (ref 135–145)

## 2018-05-18 LAB — CBC
HEMATOCRIT: 34 % — AB (ref 39.0–52.0)
Hemoglobin: 10.9 g/dL — ABNORMAL LOW (ref 13.0–17.0)
MCH: 27.5 pg (ref 26.0–34.0)
MCHC: 32.1 g/dL (ref 30.0–36.0)
MCV: 85.9 fL (ref 78.0–100.0)
Platelets: 206 10*3/uL (ref 150–400)
RBC: 3.96 MIL/uL — AB (ref 4.22–5.81)
RDW: 11.7 % (ref 11.5–15.5)
WBC: 7.1 10*3/uL (ref 4.0–10.5)

## 2018-05-18 MED ORDER — LISINOPRIL 2.5 MG PO TABS
2.5000 mg | ORAL_TABLET | Freq: Every day | ORAL | Status: DC
  Administered 2018-05-18 – 2018-05-19 (×2): 2.5 mg via ORAL
  Filled 2018-05-18 (×2): qty 1

## 2018-05-18 NOTE — Evaluation (Signed)
Physical Therapy Evaluation Patient Details Name: Edward [name] MRN: [id number] DOB: 08/10/1971 Today's Date: 05/18/2018   History of Present Illness  Pt is a 47 y/o male admitted secondary to R foot osteomyelitis. Pt is s/p R partial 5th ray amputation. PMH includes DM, HTN, and L transmet amputation.   Clinical Impression  Pt s/p surgery above with deficits below. Pt requiring mod A to stand, and min guard to perform stand pivot to chair using RW. Pt reports sons are available to help at d/c and pt planning on returning home. Educated about weightbearing precautions and how to don/doff CAM boot. Will continue to follow acutely to maximize functional mobility independence and safety.     Follow Up Recommendations Supervision for mobility/OOB;Other (comment)(HHPT vs OPPT pending progress )    Equipment Recommendations  None recommended by PT    Recommendations for Other Services       Precautions / Restrictions Precautions Precautions: Fall;Other (comment) Precaution Comments: wound vac  Required Braces or Orthoses: Other Brace/Splint Other Brace/Splint: R CAM walker boot  Restrictions Weight Bearing Restrictions: Yes RLE Weight Bearing: Weight bearing as tolerated Other Position/Activity Restrictions: WBAT in CAM walker boot       Mobility  Bed Mobility Overal bed mobility: Needs Assistance Bed Mobility: Supine to Sit     Supine to sit: Supervision     General bed mobility comments: Supervision for safety. Increased time required. Mild dizziness reported, however, resolved with seated rest.   Transfers Overall transfer level: Needs assistance Equipment used: Rolling walker (2 wheeled) Transfers: Sit to/from [facility] Sit to Stand: Mod assist Stand pivot transfers: Min guard       General transfer comment: Mod A for lift assist and steadying. Verbal cues for safe hand placement. Min guard for safety to perform stand pivot to chair. Verbal cues for  sequencing using RW.   Ambulation/Gait                Stairs            Wheelchair Mobility    Modified Rankin (Stroke Patients Only)       Balance Overall balance assessment: Needs assistance Sitting-balance support: No upper extremity supported;Feet supported Sitting balance-Leahy Scale: Good     Standing balance support: Bilateral upper extremity supported;During functional activity Standing balance-Leahy Scale: Poor Standing balance comment: Reliant on BUE support                              Pertinent Vitals/Pain Pain Assessment: 0-10 Pain Score: 7  Pain Location: R foot  Pain Descriptors / Indicators: Aching;Operative site guarding Pain Intervention(s): Limited activity within patient's tolerance;Monitored during session;Repositioned    Home Living Family/patient expects to be discharged to:: Private residence Living Arrangements: Children Available Help at Discharge: Family;Available 24 hours/day Type of Home: House Home Access: Stairs to enter Entrance Stairs-Rails: Left Entrance Stairs-Number of Steps: 1(porch step ) Home Layout: One level Home Equipment: Walker - 2 wheels;Cane - single point;Bedside commode;Wheelchair - manual      Prior Function Level of Independence: Independent with assistive device(s)         Comments: Was using cane for ambulation      Hand Dominance        Extremity/Trunk Assessment   Upper Extremity Assessment Upper Extremity Assessment: Defer to OT evaluation    Lower Extremity Assessment Lower Extremity Assessment: RLE deficits/detail;LLE deficits/detail RLE Deficits / Details: Deficits consistent with post  op pain and weakness.  LLE Deficits / Details: L transmetatarsal amputation.     Cervical / Trunk Assessment Cervical / Trunk Assessment: Normal  Communication   Communication: No difficulties  Cognition Arousal/Alertness: Awake/alert Behavior During Therapy: WFL for tasks  assessed/performed Overall Cognitive Status: Within Functional Limits for tasks assessed                                        General Comments      Exercises     Assessment/Plan    PT Assessment Patient needs continued PT services  PT Problem List Decreased strength;Decreased balance;Decreased mobility;Decreased knowledge of use of DME;Decreased knowledge of precautions;Pain       PT Treatment Interventions DME instruction;Gait training;Stair training;Functional mobility training;Therapeutic exercise;Therapeutic activities;Balance training;Patient/family education    PT Goals (Current goals can be found in the Care Plan section)  Acute Rehab PT Goals Patient Stated Goal: to go home  PT Goal Formulation: With patient Time For Goal Achievement: 06/01/18 Potential to Achieve Goals: Good    Frequency Min 3X/week   Barriers to discharge        Co-evaluation               AM-PAC PT "6 Clicks" Daily Activity  Outcome Measure Difficulty turning over in bed (including adjusting bedclothes, sheets and blankets)?: A Little Difficulty moving from lying on back to sitting on the side of the bed? : A Little Difficulty sitting down on and standing up from a chair with arms (e.g., wheelchair, bedside commode, etc,.)?: Unable Help needed moving to and from a bed to chair (including a wheelchair)?: A Little Help needed walking in hospital room?: A Little Help needed climbing 3-5 steps with a railing? : A Lot 6 Click Score: 15    End of Session Equipment Utilized During Treatment: Gait belt Activity Tolerance: Patient tolerated treatment well Patient left: in chair;with call bell/phone within reach Nurse Communication: Mobility status PT Visit Diagnosis: Unsteadiness on feet (R26.81);Muscle weakness (generalized) (M62.81);Pain Pain - Right/Left: Right Pain - part of body: Ankle and joints of foot    Time: 1511-1531 PT Time Calculation (min) (ACUTE ONLY):  20 min   Charges:   PT Evaluation $PT Eval Low Complexity: [place], PT, DPT  Acute Rehabilitation Services  Pager: [phone] Office: [phone]   Rudean Hitt 05/18/2018, 4:49 PM

## 2018-05-18 NOTE — Progress Notes (Signed)
Inpatient Diabetes Program Recommendations  AACE/ADA: New Consensus Statement on Inpatient Glycemic Control (2015)  Target Ranges:  Prepandial:   less than 140 mg/dL      Peak postprandial:   less than 180 mg/dL (1-2 hours)      Critically ill patients:  140 - 180 mg/dL   Lab Results  Component Value Date   GLUCAP 159 (H) 05/18/2018   HGBA1C 15.9 (H) 05/14/2018    Review of Glycemic ControlResults for Edward [name], Edward [name] (MRN [id number]) as of 05/18/2018 13:25  Ref. Range 05/17/2018 20:56 05/18/2018 00:25 05/18/2018 04:31 05/18/2018 08:48 05/18/2018 11:18  Glucose-Capillary Latest Ref Range: 70 - 99 mg/dL 211 (H) 140 (H) 100 (H) 198 (H) 159 (H)   Diabetes history: Type 2 DM  Outpatient Diabetes medications: Lantus 40 units q HS, Humalog 12 units tid with meals- pt. Not taking Current orders for Inpatient glycemic control: Novolog resistant q 4 hours, Novolog 3 units tid with meals, Levemir 22 units q HS  Inpatient Diabetes Program Recommendations:    Per conversation with patient on 9/27, he does not have insurance for medications.   -Consider d/c of Levemir  -Start Novolog 70/30 15 units bid with meals(titrate based on CBG's) -change Novolog correction to tid with meals  Note 70/30 (reli-on) can be purchased from [facility] for 25$ a vial and is more affordable.  This insulin also only requires 2 injections/day which may help with compliance.   Thanks, Adah Perl, RN, BC-ADM Inpatient Diabetes Coordinator Pager [phone] (8a-5p)

## 2018-05-18 NOTE — Progress Notes (Signed)
PROGRESS NOTE                                                                                                                                                                                                             Patient Demographics:    Edward [name], is a 47 y.o. male, DOB - 1971-02-02, WPV:[id number]  Admit date - 05/14/2018   Admitting Physician Ankit Arsenio Loader, MD  Outpatient Primary MD for the patient is [name], MD  LOS - 4   Chief Complaint  Patient presents with  . Foot Pain       Brief Narrative    47 y.o. male with medical history significant of with past medical history of uncontrolled diabetes mellitus type 2 and lower extremity wound comes to the hospital for evaluation of opening of the right lower extremity wound.  His work-up significant for sepsis secondary to left fifth metatarsal osteomyelitis, went for 3 amputation by podiatry 05/16/2018, , work-up significant for Streptococcus bacteremia.   Subjective:    Edward [name] today has, No headache, No chest pain, No abdominal pain - No Nausea or vomiting, is afebrile over last 24 hours   a Active Problems:   Diabetic osteomyelitis (HCC)   Osteomyelitis of right foot (HCC)   DM2 (diabetes mellitus, type 2) (HCC)   Sepsis secondary to right lower extremity wound infection with left fifth metatarsal osteomyelitis . -Presents with fever, tachycardia, leukocytosis, and worsening renal function, sepsis criteria met on admission. -Secondary to left foot infection, MRI with evidence of fifth metatarsal bone to myelitis, he has wound with foul-smelling order, podiatry input greatly appreciated, went for partial fifth ray amputation of right foot by dietary on 05/16/2018 -Broad-spectrum antibiotic vancomycin and cefepime on admission, IV vancomycin has been stopped initially given Streptococcus bacteremia, now blood culture came back for Streptococcus viridans, I have discussed  with ID, recommendation to continue for IV cefazolin during hospital stay, and will follow on final sensitivities about outpatient discharge regimen, likely will need total of 2 weeks treatment. -Per orthopedic, wound has been provided, as well bone, no residual infected tissue or bone, will be able to discharge on oral regimen, he will be on IV antibiotic during hospital stay though.  Streptococcus bacteremia -Continue with IV cefazolin during hospital stay, will transition to oral regimen on discharge, so far surveillance blood  culture with no growth to date  Poorly controlled type 2 diabetes mellitus -Patient reports he is not compliant with diet,not  fully compliant with medications, -Ruben A1c is 15.9 -CBG are better controlled after increasing Levemir to 22 units, will continue with insulin sliding scale    AKI on CKD stage III -Baseline creatinine 1.5, peaked at 1.8, improving with hydration, will start on low-dose lisinopril      Code Status : full  Family Communication  : None at bedside  Disposition Plan  : Home when stable  Consults  :  Podiatry  Procedures  : s/p Partial fifth ray amputation right foot secondary to osteomyelitis and diabetic foot ulcer.  By Dr. Daylene Katayama DOS 05/16/18  DVT Prophylaxis  :  [facility] heparin  Lab Results  Component Value Date   PLT 206 05/18/2018    Antibiotics  :   Anti-infectives (From admission, onward)   Start     Dose/Rate Route Frequency Ordered Stop   05/18/18 0600  ceFAZolin (ANCEF) IVPB 2g/100 mL premix  Status:  Discontinued     2 g 200 mL/hr over 30 Minutes Intravenous Every 8 hours 05/17/18 1133 05/17/18 1140   05/17/18 1800  ceFAZolin (ANCEF) IVPB 2g/100 mL premix     2 g 200 mL/hr over 30 Minutes Intravenous Every 8 hours 05/17/18 1140     05/15/18 0700  vancomycin (VANCOCIN) 1,250 mg in sodium chloride 0.9 % 250 mL IVPB  Status:  Discontinued     1,250 mg 166.7 mL/hr over 90 Minutes Intravenous Every 12 hours  05/14/18 1840 05/16/18 1233   05/14/18 1845  vancomycin (VANCOCIN) 2,000 mg in sodium chloride 0.9 % 500 mL IVPB  Status:  Discontinued     2,000 mg 250 mL/hr over 120 Minutes Intravenous  Once 05/14/18 1832 05/16/18 0840   05/14/18 1845  ceFEPIme (MAXIPIME) 2 g in sodium chloride 0.9 % 100 mL IVPB  Status:  Discontinued     2 g 200 mL/hr over 30 Minutes Intravenous Every 8 hours 05/14/18 1832 05/17/18 1100   05/14/18 1545  piperacillin-tazobactam (ZOSYN) IVPB 3.375 g  Status:  Discontinued     3.375 g 100 mL/hr over 30 Minutes Intravenous  Once 05/14/18 1530 05/14/18 1832        Objective:   Vitals:   05/17/18 0412 05/17/18 1508 05/17/18 2048 05/18/18 0630  BP: (!) 153/91 137/84 (!) 168/99 (!) 154/89  Pulse: 100 97 96 75  Resp: 18 16 17    Temp: 98.7 F (37.1 C) 99.6 F (37.6 C) 99.3 F (37.4 C) 98.1 F (36.7 C)  TempSrc: Oral Oral Oral Oral  SpO2: 99% 99% 98% 97%  Weight:      Height:        Wt Readings from Last 3 Encounters:  05/16/18 98.8 kg  01/21/17 109 kg  10/11/16 105.7 kg     Intake/Output Summary (Last 24 hours) at 05/18/2018 1252 Last data filed at 05/18/2018 0718 Gross per 24 hour  Intake 3725.65 ml  Output 800 ml  Net 2925.65 ml     Physical Exam  Awake Alert, Oriented X 3, No new F.N deficits, Normal affect Symmetrical Chest wall movement, Good air movement bilaterally, CTAB RRR,No Gallops,Rubs or new Murmurs, No Parasternal Heave +ve B.Sounds, Abd Soft, No tenderness, No rebound - guarding or rigidity. Old left transmetatarsal amputation, right foot status post chest x-ray amputation currently on wound VAC with minimal with an VAC container.     Data Review:  CBC Recent Labs  Lab 05/14/18 1213 05/14/18 2003 05/15/18 0455 05/16/18 0343 05/17/18 0732 05/18/18 0424  WBC 5.6 8.2 6.8 5.8 6.0 7.1  HGB 14.5 15.1 13.9 12.6* 11.5* 10.9*  HCT 45.0 45.9 42.3 38.5* 36.1* 34.0*  PLT 315 317 259 258 201 206  MCV 84.7 84.4 84.8 84.6 85.1 85.9    MCH 27.3 27.8 27.9 27.7 27.1 27.5  MCHC 32.2 32.9 32.9 32.7 31.9 32.1  RDW 11.6 11.7 11.6 11.6 11.8 11.7  LYMPHSABS 1.1  --   --   --   --   --   MONOABS 0.5  --   --   --   --   --   EOSABS 0.1  --   --   --   --   --   BASOSABS 0.1  --   --   --   --   --     Chemistries  Recent Labs  Lab 05/14/18 1213 05/14/18 2003 05/15/18 0455 05/16/18 0343 05/17/18 0732 05/18/18 0424  NA 131*  --  132* 130* 133* 135  K 4.7  --  4.3 4.3 4.4 4.0  CL 102  --  103 104 109 110  CO2 19*  --  18* 18* 17* 20*  GLUCOSE 438*  --  226* 358* 217* 96  BUN 20  --  25* 23* 19 14  CREATININE 1.70* 1.83* 1.61* 1.49* 1.34* 1.20  CALCIUM 8.7*  --  8.5* 8.0* 8.0* 8.0*  AST 13*  --  15  --   --   --   ALT 11  --  12  --   --   --   ALKPHOS 59  --  56  --   --   --   BILITOT 0.6  --  0.8  --   --   --    ------------------------------------------------------------------------------------------------------------------ No results for input(s): CHOL, HDL, LDLCALC, TRIG, CHOLHDL, LDLDIRECT in the last 72 hours.  Lab Results  Component Value Date   HGBA1C 15.9 (H) 05/14/2018   ------------------------------------------------------------------------------------------------------------------ No results for input(s): TSH, T4TOTAL, T3FREE, THYROIDAB in the last 72 hours.  Invalid input(s): FREET3 ------------------------------------------------------------------------------------------------------------------ No results for input(s): VITAMINB12, FOLATE, FERRITIN, TIBC, IRON, RETICCTPCT in the last 72 hours.  Coagulation profile Recent Labs  Lab 05/15/18 0455  INR 1.01    No results for input(s): DDIMER in the last 72 hours.  Cardiac Enzymes No results for input(s): CKMB, TROPONINI, MYOGLOBIN in the last 168 hours.  Invalid input(s): CK ------------------------------------------------------------------------------------------------------------------ No results found for: BNP  Inpatient  Medications  Scheduled Meds: . heparin  5,000 Units Subcutaneous Q8H  . insulin aspart  0-20 Units Subcutaneous Q4H  . insulin aspart  0-5 Units Subcutaneous QHS  . insulin aspart  3 Units Subcutaneous TID WC  . insulin detemir  22 Units Subcutaneous Daily  . sodium chloride flush  3 mL Intravenous Q12H   Continuous Infusions: . sodium chloride 75 mL/hr at 05/18/18 0051  . sodium chloride    .  ceFAZolin (ANCEF) IV 2 g (05/18/18 0438)  . lactated ringers 10 mL/hr at 05/16/18 1433   PRN Meds:.sodium chloride, acetaminophen **OR** acetaminophen, bisacodyl, HYDROcodone-acetaminophen, oxyCODONE-acetaminophen, promethazine, senna-docusate, sodium chloride flush  Micro Results Recent Results (from the past 240 hour(s))  Blood culture (routine x 2)     Status: None (Preliminary result)   Collection Time: 05/14/18  3:36 PM  Result Value Ref Range Status   Specimen Description BLOOD LEFT ARM  Final   Special Requests  Final    BOTTLES DRAWN AEROBIC ONLY Blood Culture results may not be optimal due to an inadequate volume of blood received in culture bottles   Culture   Final    NO GROWTH 3 DAYS Performed at [facility], [place] [address] [place], [place] [address]    Report Status PENDING  Incomplete  Blood culture (routine x 2)     Status: Abnormal   Collection Time: 05/14/18  6:05 PM  Result Value Ref Range Status   Specimen Description BLOOD RIGHT HAND  Final   Special Requests   Final    BOTTLES DRAWN AEROBIC ONLY Blood Culture adequate volume   Culture  Setup Time   Final    GRAM POSITIVE COCCI IN CHAINS AEROBIC BOTTLE ONLY CRITICAL RESULT CALLED TO, READ BACK BY AND VERIFIED WITH: J. LEDFORD PHARMD, AT 2317 9/277/19 BY D. VANHOOK    Culture (A)  Final    VIRIDANS STREPTOCOCCUS THE SIGNIFICANCE OF ISOLATING THIS ORGANISM FROM A SINGLE SET OF BLOOD CULTURES WHEN MULTIPLE SETS ARE DRAWN IS UNCERTAIN. PLEASE NOTIFY THE MICROBIOLOGY DEPARTMENT WITHIN ONE WEEK IF SPECIATION  AND SENSITIVITIES ARE REQUIRED. Performed at [facility], [place] [address], [place], [place] [address]    Report Status 05/17/2018 FINAL  Final  Blood Culture ID Panel (Reflexed)     Status: Abnormal   Collection Time: 05/14/18  6:05 PM  Result Value Ref Range Status   Enterococcus species NOT DETECTED NOT DETECTED Final   Listeria monocytogenes NOT DETECTED NOT DETECTED Final   Staphylococcus species NOT DETECTED NOT DETECTED Final   Staphylococcus aureus NOT DETECTED NOT DETECTED Final   Streptococcus species DETECTED (A) NOT DETECTED Final    Comment: Not Enterococcus species, Streptococcus agalactiae, Streptococcus pyogenes, or Streptococcus pneumoniae. CRITICAL RESULT CALLED TO, READ BACK BY AND VERIFIED WITH: J. LEDFORD PHARMD, AT 2317 9/277/19 BY D. VANHOOK    Streptococcus agalactiae NOT DETECTED NOT DETECTED Final   Streptococcus pneumoniae NOT DETECTED NOT DETECTED Final   Streptococcus pyogenes NOT DETECTED NOT DETECTED Final   Acinetobacter baumannii NOT DETECTED NOT DETECTED Final   Enterobacteriaceae species NOT DETECTED NOT DETECTED Final   Enterobacter cloacae complex NOT DETECTED NOT DETECTED Final   Escherichia coli NOT DETECTED NOT DETECTED Final   Klebsiella oxytoca NOT DETECTED NOT DETECTED Final   Klebsiella pneumoniae NOT DETECTED NOT DETECTED Final   Proteus species NOT DETECTED NOT DETECTED Final   Serratia marcescens NOT DETECTED NOT DETECTED Final   Haemophilus influenzae NOT DETECTED NOT DETECTED Final   Neisseria meningitidis NOT DETECTED NOT DETECTED Final   Pseudomonas aeruginosa NOT DETECTED NOT DETECTED Final   Candida albicans NOT DETECTED NOT DETECTED Final   Candida glabrata NOT DETECTED NOT DETECTED Final   Candida krusei NOT DETECTED NOT DETECTED Final   Candida parapsilosis NOT DETECTED NOT DETECTED Final   Candida tropicalis NOT DETECTED NOT DETECTED Final    Comment: Performed at [facility], [place]. [address] [place], [place]  [address]  Surgical pcr screen     Status: None   Collection Time: 05/15/18  6:54 PM  Result Value Ref Range Status   MRSA, PCR NEGATIVE NEGATIVE Final   Staphylococcus aureus NEGATIVE NEGATIVE Final    Comment: (NOTE) The Xpert SA Assay (FDA approved for NASAL specimens in patients 72 years of age and older), is one component of a comprehensive surveillance program. It is not intended to diagnose infection nor to guide or monitor treatment. Performed at [facility], [place] [address] [place], [place] [address]   Aerobic/Anaerobic Culture (surgical/deep wound)     Status: None (Preliminary result)   Collection Time: 05/16/18  3:40 PM  Result Value Ref Range Status   Specimen Description WOUND RIGHT FOOT  Final   Special Requests PT ON MAXIPIME  Final   Gram Stain NO WBC SEEN NO ORGANISMS SEEN   Final   Culture   Final    CULTURE REINCUBATED FOR BETTER GROWTH Performed at [facility], [address], [place], [place] [address]    Report Status PENDING  Incomplete    Radiology Reports Mr Foot Right W Wo Contrast  Result Date: 05/14/2018 CLINICAL DATA:  47 year old male with history of diabetes and chronic right foot ulcer adjacent to the fifth toe presents with worsening erythema. Foot ulcer has been present for more than 6 months. EXAM: MRI OF THE RIGHT FOREFOOT WITHOUT AND WITH CONTRAST TECHNIQUE: Multiplanar, multisequence MR imaging of the right forefoot was performed before and after the administration of intravenous contrast. CONTRAST:  10 mL Gadavist COMPARISON:  Foot radiographs from the same day FINDINGS: Bones/Joint/Cartilage Marrow edema and subtle cortical bone loss involving the base of the right fifth proximal phalanx consistent with acute osteomyelitis. The first through fifth rays are otherwise unremarkable in appearance. Ligaments Noncontributory Muscles and Tendons No pyomyositis. Intact flexor and extensor tendons crossing the forefoot. Soft tissues Moderate  degree of inflammatory soft tissue swelling and edema involving the base of the right fifth toe with soft tissue ulceration. More generalized diffuse soft tissue swelling of the forefoot more so along the plantar and to a lesser degree dorsum of the forefoot. IMPRESSION: Soft tissue ulcer with marrow edema and subtle cortical bone loss involving the fifth toe and fifth proximal phalanx consistent with acute osteomyelitis. Electronically Signed   By: Ashley Royalty M.D.   On: 05/14/2018 17:30   Dg Foot Complete Right  Result Date: 05/16/2018 CLINICAL DATA:  Status post amputation EXAM: RIGHT FOOT COMPLETE - 3+ VIEW COMPARISON:  MRI 05/14/2018, radiograph 05/14/2018, 10/16/2017 FINDINGS: Interval fifth ray amputation at the level of the proximal fifth metatarsal. Cutaneous staples and drain over the lateral aspect of the foot. Cut margin is smooth. No acute fracture or malalignment. No radiopaque foreign body. IMPRESSION: Interval amputation of fifth digit at the level of the proximal fifth metatarsal with expected postsurgical changes Electronically Signed   By: Donavan Foil M.D.   On: 05/16/2018 18:21   Dg Foot Complete Right  Result Date: 05/14/2018 CLINICAL DATA:  Diabetic ulcer lateral right foot EXAM: RIGHT FOOT COMPLETE - 3+ VIEW COMPARISON:  10/16/2017 FINDINGS: Soft tissue ulcer noted over the right 5th MTP joint. Lucency noted at the base of the right 5th toe proximal phalanx could reflect fracture or osteomyelitis. No additional acute bony abnormality. IMPRESSION: Soft tissue defect over the right 5th metatarsal compatible with soft tissue ulcer. Lucency at the base of the right 5th toe proximal phalanx could reflect fracture or osteomyelitis. Electronically Signed   By: Rolm Baptise M.D.   On: 05/14/2018 12:54     Phillips Climes M.D on 05/18/2018 at 12:52 PM  Between 7am to 7pm - Pager - [phone]  After 7pm go to [email or web address] - password [facility]  [facility] -  Office   [phone]

## 2018-05-18 NOTE — Plan of Care (Signed)
  Problem: Education: Goal: Knowledge of General Education information will improve Description Including pain rating scale, medication(s)/side effects and non-pharmacologic comfort measures Outcome: Progressing   Problem: Health Behavior/Discharge Planning: Goal: Ability to manage health-related needs will improve Outcome: Progressing   Problem: Clinical Measurements: Goal: Will remain free from infection Outcome: Progressing   Problem: Activity: Goal: Risk for activity intolerance will decrease Outcome: Progressing   Problem: Nutrition: Goal: Adequate nutrition will be maintained Outcome: Progressing   Problem: Coping: Goal: Level of anxiety will decrease Outcome: Progressing   Problem: Elimination: Goal: Will not experience complications related to bowel motility Outcome: Progressing Goal: Will not experience complications related to urinary retention Outcome: Progressing   Problem: Pain Managment: Goal: General experience of comfort will improve Outcome: Progressing   Problem: Safety: Goal: Ability to remain free from injury will improve Outcome: Progressing   Problem: Skin Integrity: Goal: Risk for impaired skin integrity will decrease Outcome: Progressing   Problem: Education: Goal: Knowledge of General Education information will improve Description Including pain rating scale, medication(s)/side effects and non-pharmacologic comfort measures Outcome: Progressing   Problem: Health Behavior/Discharge Planning: Goal: Ability to manage health-related needs will improve Outcome: Progressing   Problem: Clinical Measurements: Goal: Will remain free from infection Outcome: Progressing   Problem: Activity: Goal: Risk for activity intolerance will decrease Outcome: Progressing   Problem: Nutrition: Goal: Adequate nutrition will be maintained Outcome: Progressing   Problem: Coping: Goal: Level of anxiety will decrease Outcome: Progressing   Problem:  Elimination: Goal: Will not experience complications related to bowel motility Outcome: Progressing Goal: Will not experience complications related to urinary retention Outcome: Progressing   Problem: Pain Managment: Goal: General experience of comfort will improve Outcome: Progressing   Problem: Safety: Goal: Ability to remain free from injury will improve Outcome: Progressing   Problem: Skin Integrity: Goal: Risk for impaired skin integrity will decrease Outcome: Progressing

## 2018-05-19 ENCOUNTER — Telehealth: Payer: Self-pay

## 2018-05-19 ENCOUNTER — Encounter (HOSPITAL_COMMUNITY): Payer: Self-pay | Admitting: Podiatry

## 2018-05-19 DIAGNOSIS — R739 Hyperglycemia, unspecified: Secondary | ICD-10-CM

## 2018-05-19 DIAGNOSIS — M86171 Other acute osteomyelitis, right ankle and foot: Secondary | ICD-10-CM

## 2018-05-19 LAB — GLUCOSE, CAPILLARY
GLUCOSE-CAPILLARY: 123 mg/dL — AB (ref 70–99)
GLUCOSE-CAPILLARY: 89 mg/dL (ref 70–99)
Glucose-Capillary: 118 mg/dL — ABNORMAL HIGH (ref 70–99)
Glucose-Capillary: 149 mg/dL — ABNORMAL HIGH (ref 70–99)
Glucose-Capillary: 170 mg/dL — ABNORMAL HIGH (ref 70–99)

## 2018-05-19 LAB — CULTURE, BLOOD (ROUTINE X 2): CULTURE: NO GROWTH

## 2018-05-19 MED ORDER — INSULIN ASPART PROT & ASPART (70-30 MIX) 100 UNIT/ML PEN: 15.0000 [IU] | PEN_INJECTOR | Freq: Two times a day (BID) | SUBCUTANEOUS | 4 refills | Status: DC

## 2018-05-19 MED ORDER — LISINOPRIL 2.5 MG PO TABS: 2.5000 mg | ORAL_TABLET | Freq: Every day | ORAL | 0 refills | Status: DC

## 2018-05-19 MED ORDER — AMOXICILLIN-POT CLAVULANATE 875-125 MG PO TABS: 1.0000 | ORAL_TABLET | Freq: Two times a day (BID) | ORAL | 0 refills | Status: AC

## 2018-05-19 MED ORDER — SITAGLIPTIN PHOS-METFORMIN HCL 50-1000 MG PO TABS: 1.0000 | ORAL_TABLET | Freq: Two times a day (BID) | ORAL | 3 refills | Status: DC

## 2018-05-19 MED ORDER — LACTINEX PO CHEW: 1.0000 | CHEWABLE_TABLET | Freq: Three times a day (TID) | ORAL | 0 refills | Status: DC

## 2018-05-19 MED ORDER — INSULIN PEN NEEDLE 31G X 5 MM MISC: 2 refills | Status: DC

## 2018-05-19 MED ORDER — INSULIN ASPART PROT & ASPART (70-30 MIX) 100 UNIT/ML ~~LOC~~ SUSP
15.0000 [IU] | Freq: Two times a day (BID) | SUBCUTANEOUS | Status: DC
  Administered 2018-05-19: 15 [IU] via SUBCUTANEOUS
  Filled 2018-05-19: qty 10

## 2018-05-19 MED ORDER — TRAMADOL HCL 50 MG PO TABS: 50.0000 mg | ORAL_TABLET | Freq: Two times a day (BID) | ORAL | 0 refills | Status: DC | PRN

## 2018-05-19 MED FILL — traMADol HCL 50 MG TABS: 50 | 5 days supply | Qty: 10 | Fill #0

## 2018-05-19 MED FILL — LISINOPRIL 2.5 MG TABLET: 2.5 | 30 days supply | Qty: 30 | Fill #0

## 2018-05-19 MED FILL — JANUMET 50-1,000 MG TABLET: 50-1000 | 30 days supply | Qty: 60 | Fill #0

## 2018-05-19 MED FILL — UNIFINE PENTIPS 31GX3/16": 31G X 5 MM | 30 days supply | Qty: 100 | Fill #0

## 2018-05-19 MED FILL — NOVOLOG MIX 70-30 FLEXPEN S: (70-30) 100 | 30 days supply | Qty: 9 | Fill #0

## 2018-05-19 MED FILL — AMOX-CLAV 875-125 MG TABLET: 875-125 | 9 days supply | Qty: 18 | Fill #0

## 2018-05-19 MED FILL — LACTINEX CHEWABLE TABLET: 17 days supply | Qty: 50 | Fill #0

## 2018-05-19 MED FILL — UNIFINE PENTIPS 31GX3/16: 31G X 5 MM | 30 days supply | Qty: 100 | Fill #0

## 2018-05-19 NOTE — Progress Notes (Signed)
RN gave pt discharge instructions and prescriptions. Pt state understanding and did nto have any questions.

## 2018-05-19 NOTE — Progress Notes (Signed)
Physical Therapy Treatment Patient Details Name: Edward [name] MRN: [id number] DOB: October 15, 1970 Today's Date: 05/19/2018    History of Present Illness Pt is a 47 y/o male admitted secondary to R foot osteomyelitis. Pt is s/p R partial 5th ray amputation. PMH includes DM, HTN, and L transmet amputation.     PT Comments    Pt performed gait training and functional mobility during session this am.  Pt is slightly unsteady based on limb length discrepancy in standing.   Educated patient to use shoe with spacer insert to level out limb length in standing and provid support where his forefoot is not present.  Based on function today he is best suited for HHPT.  Pt is slow and guarded but able to performed stair training to review entry into home.  Re-educated patient on application of CAM boot but he will require assistance at home to place boot as he required assistance this am.     Follow Up Recommendations  Supervision for mobility/OOB;Other (comment)(HHPT vs. OPPT if patient leaving today HHPT is more appropriate based on balance deficits.  )     Equipment Recommendations  None recommended by PT    Recommendations for Other Services       Precautions / Restrictions Precautions Precautions: Fall;Other (comment) Precaution Comments: wound vac  Required Braces or Orthoses: Other Brace/Splint Other Brace/Splint: R CAM walker boot  Restrictions Weight Bearing Restrictions: Yes RLE Weight Bearing: Weight bearing as tolerated Other Position/Activity Restrictions: WBAT in CAM walker boot, educated on using shoe with spacer insert to level out standing.      Mobility  Bed Mobility Overal bed mobility: Needs Assistance Bed Mobility: Supine to Sit     Supine to sit: Supervision     General bed mobility comments: Supervision for safety. Increased time required.   Transfers Overall transfer level: Needs assistance Equipment used: Rolling walker (2 wheeled) Transfers: Sit to/from  Stand Sit to Stand: Min assist;Mod assist(on first attempt patient presents with LOB posteriorly.  )         General transfer comment: Pt required mod assistance to regain balance and return to seated position on first attempt.  Pt only required min assistance for next attempt.  Pt unsteady in standing with CAM boot on R and old transmetatarsal amputation on L.  Pt has a shoe with insert and educated on using during transfer to improve balance during transitions and standing.    Ambulation/Gait Ambulation/Gait assistance: Min guard Gait Distance (Feet): 100 Feet Assistive device: Rolling walker (2 wheeled) Gait Pattern/deviations: Step-through pattern;Trunk flexed;Antalgic;Decreased stride length     General Gait Details: Pt walking on distal portion of LLE and unbalanced due to use of cam walker.  He is slow and cautious required cues for RW safety and educated to use shoe on L side to level out his stance and improve his balance.     Stairs Stairs: Yes Stairs assistance: Min guard Stair Management: Forwards;No rails;Step to pattern;With walker Number of Stairs: 2 General stair comments: Cues for sequencing and RW placement.  Pt performed curb stair x2 reps.     Wheelchair Mobility    Modified Rankin (Stroke Patients Only)       Balance Overall balance assessment: Needs assistance Sitting-balance support: No upper extremity supported;Feet supported Sitting balance-Leahy Scale: Good     Standing balance support: Bilateral upper extremity supported;During functional activity Standing balance-Leahy Scale: Poor Standing balance comment: Reliant on BUE support  Cognition Arousal/Alertness: Awake/alert Behavior During Therapy: WFL for tasks assessed/performed Overall Cognitive Status: Within Functional Limits for tasks assessed                                        Exercises      General Comments         Pertinent Vitals/Pain Pain Assessment: 0-10 Pain Score: 7  Pain Location: R foot  Pain Descriptors / Indicators: Aching;Operative site guarding Pain Intervention(s): Monitored during session;Repositioned    Home Living                      Prior Function            PT Goals (current goals can now be found in the care plan section) Acute Rehab PT Goals Patient Stated Goal: to go home  Potential to Achieve Goals: Good Progress towards PT goals: Progressing toward goals    Frequency    Min 3X/week      PT Plan Current plan remains appropriate    Co-evaluation              AM-PAC PT "6 Clicks" Daily Activity  Outcome Measure  Difficulty turning over in bed (including adjusting bedclothes, sheets and blankets)?: A Little Difficulty moving from lying on back to sitting on the side of the bed? : A Little Difficulty sitting down on and standing up from a chair with arms (e.g., wheelchair, bedside commode, etc,.)?: Unable Help needed moving to and from a bed to chair (including a wheelchair)?: A Little Help needed walking in hospital room?: A Little Help needed climbing 3-5 steps with a railing? : A Little 6 Click Score: 16    End of Session Equipment Utilized During Treatment: Gait belt Activity Tolerance: Patient tolerated treatment well Patient left: in chair;with call bell/phone within reach Nurse Communication: Mobility status PT Visit Diagnosis: Unsteadiness on feet (R26.81);Muscle weakness (generalized) (M62.81);Pain Pain - Right/Left: Right Pain - part of body: Ankle and joints of foot     Time: 3016-0109 PT Time Calculation (min) (ACUTE ONLY): 30 min  Charges:  $Gait Training: 8-22 mins $Therapeutic Activity: 8-22 mins                     Governor Rooks, PTA Acute Rehabilitation Services Pager [phone] Office [phone]     Harvy Riera Eli Hose 05/19/2018, 11:36 AM

## 2018-05-19 NOTE — Discharge Summary (Signed)
Edward [name], is a 47 y.o. male  DOB [birth year]  MRN [id number].  Admission date:  05/14/2018  Admitting Physician  Damita Lack, MD  Discharge Date:  05/19/2018   Primary MD  [name], MD  Recommendations for primary care physician for things to follow:  -Check CBC, BMP during next visit -Please continue counseling about compliance with medication, and carbohydrate modified diet -Patient to follow with orthopedic in 1 week from discharge, please see discharge recommendation at wound care and weightbearing   Admission Diagnosis  Hyperglycemia [R73.9] Chronic ulcer of great toe of right foot, unspecified ulcer stage ([place]) [L97.519]   Discharge Diagnosis  Hyperglycemia [R73.9] Chronic ulcer of great toe of right foot, unspecified ulcer stage ([place]) [L97.519]    Active Problems:   Diabetic osteomyelitis ([place])   Osteomyelitis of right foot ([place])   DM2 (diabetes mellitus, type 2) ([place])      Past Medical History:  Diagnosis Date  . Anxiety    self reported  . Arthritis   . Depression    self reported  . Diabetes mellitus   . Hypertension     Past Surgical History:  Procedure Laterality Date  . AMPUTATION Left 05/10/2016   Procedure: Left Transmetatarsal Amputation;  Surgeon: [name], MD;  Location: [facility];  Service: Orthopedics;  Laterality: Left;  . CIRCUMCISION  09/02/2011   Procedure: CIRCUMCISION ADULT;  Surgeon: Molli Hazard, MD;  Location: [facility];  Service: Urology;  Laterality: N/A;  . I&D EXTREMITY  09/02/2011   Procedure: IRRIGATION AND DEBRIDEMENT EXTREMITY;  Surgeon: Theodoro Kos, DO;  Location: [facility];  Service: Plastics;  Laterality: N/A;  . INCISION AND DRAINAGE OF WOUND  08/12/2011   Procedure: IRRIGATION AND DEBRIDEMENT WOUND;  Surgeon: Zenovia Jarred, MD;  Location: [facility];  Service: General;;  perineum  .  IRRIGATION AND DEBRIDEMENT ABSCESS  08/12/2011   Procedure: IRRIGATION AND DEBRIDEMENT ABSCESS;  Surgeon: Molli Hazard, MD;  Location: [facility];  Service: Urology;  Laterality: N/A;  irrigation and debridment perineum    . IRRIGATION AND DEBRIDEMENT ABSCESS  08/14/2011   Procedure: MINOR INCISION AND DRAINAGE OF ABSCESS;  Surgeon: Molli Hazard, MD;  Location: [facility];  Service: Urology;  Laterality: N/A;  Perineal Wound Debridement;Placement Bilateral Testicular Thigh Pouches  . TOTAL HIP ARTHROPLASTY Left 10/11/2016   Procedure: LEFT TOTAL HIP ARTHROPLASTY ANTERIOR APPROACH;  Surgeon: Mcarthur Rossetti, MD;  Location: [facility];  Service: Orthopedics;  Laterality: Left;       History of present illness and  Hospital Course:     Kindly see H&P for history of present illness and admission details, please review complete Labs, Consult reports and Test reports for all details in brief  HPI  from the history and physical done on the day of admission 05/16/2018  HPI: Edward [name] is a 47 y.o. male with medical history significant of with past medical history of uncontrolled diabetes mellitus type 2 and lower extremity wound comes  to the hospital for evaluation of opening of the right lower extremity wound.  Patient states he has suffered from right lower extremity wound for past several months and has been debrided several times by outpatient podiatry from [facility].  He was last seen by them back in July 2019.  When he noticed his wound opened up again about a week ago he called their office but because of financial reasons he was not able to follow-up with them.  In the meantime he started having some foul drainage from that area therefore came to the ER today. In the ER patient's vital signs were overall unremarkable.  His labs were around baseline.  His creatinine baseline is around 1.6, today's 1.7.  X-ray of the foot showed concerns for osteomyelitis of his right lower  extremity fifth toe.  MRI was ordered due to concerns of osteomyelitis.  Medical team was requested to admit the patient.  When I saw the patient he was quite comfortable and did not have any other new complaints besides concerns for right lower extremity wound.   Hospital Course  47 y.o.malewith medical history significant ofwith past medical history of uncontrolled diabetes mellitus type 2 and lower extremity wound comes to the hospital for evaluation of opening of the right lower extremity wound.  His work-up significant for sepsis secondary to left fifth metatarsal osteomyelitis, went for fifth ray amputation by podiatry 05/16/2018, , work-up significant for Streptococcus bacteremia.  Sepsis secondary to right lower extremity wound infection with left fifth metatarsal osteomyelitis . -Presents with fever, tachycardia, leukocytosis, and worsening renal function, sepsis criteria met on admission. -Secondary to left foot infection, MRI with evidence of fifth metatarsal bone to myelitis, he has wound with foul-smelling order, podiatry input greatly appreciated, went for partial fifth ray amputation of right foot by podiatry on 05/16/2018 -Broad-spectrum antibiotic vancomycin and cefepime on admission, IV vancomycin has been stopped initially given Streptococcus bacteremia, now blood culture came back for Streptococcus viridans, was transitioned to cefazolin during hospital stay, received total of 6 days, he will be discharged on another days of oral Augmentin as an outpatient to finish total of 2 weeks treatment of his bacteremia as discussed with ID. -Per dietary, wound has been well debrided, as well bone, no residual infected tissue or bone, will be able to discharge on oral regimen, to follow with podiatry as an outpatient.  Streptococcus bacteremia -Treated with IV cefazolin during hospital stay, surveillance blood cultures are negative at time of discharge, to be discharged on Augmentin as an  outpatient for total of 14 days treatment .  Poorly controlled type 2 diabetes mellitus -Patient reports he is not compliant with diet,not  fully compliant with medications, -Ruben A1c is 15.9 -Will be discharged on NovoLog 70/30 as this more affordable 15 units twice daily,   AKI on CKD stage III -Baseline creatinine 1.5, peaked at 1.8, improving with hydration,  recent creatinine 1.2, started on low-dose lisinopril  And was given match letter by case management for his medication   Discharge Condition:  Stable   Follow UP  Follow-up Information    [name], MD Follow up in 1 week(s).   Specialty:  Family Medicine Contact information: [address] [place] [address] [phone]        [name], DPM Follow up in 1 week(s).   Specialty:  Health visitor information: [place] [address] [phone]  Discharge Instructions  and  Discharge Medications     Discharge Instructions    Discharge instructions   Complete by:  As directed    Follow with Primary MD [name], MD in 7 days   Get CBC, CMP,  checked  by Primary MD next visit.    Disposition Home   Diet: Heart Healthy ,carbmodified , with feeding assistance and aspiration precautions.   On your next visit with your primary care physician please Get Medicines reviewed and adjusted.   Please request your Prim.MD to go over all Hospital Tests and Procedure/Radiological results at the follow up, please get all Hospital records sent to your Prim MD by signing hospital release before you go home.   If you experience worsening of your admission symptoms, develop shortness of breath, life threatening emergency, suicidal or homicidal thoughts you must seek medical attention immediately by calling 911 or calling your MD immediately  if symptoms less severe.  You Must read complete instructions/literature along with all the possible adverse  reactions/side effects for all the Medicines you take and that have been prescribed to you. Take any new Medicines after you have completely understood and accpet all the possible adverse reactions/side effects.   Do not drive, operating heavy machinery, perform activities at heights, swimming or participation in water activities or provide baby sitting services if your were admitted for syncope or siezures until you have seen by Primary MD or a Neurologist and advised to do so again.  Do not drive when taking Pain medications.    Do not take more than prescribed Pain, Sleep and Anxiety Medications  Special Instructions: If you have smoked or chewed Tobacco  in the last 2 yrs please stop smoking, stop any regular Alcohol  and or any Recreational drug use.  Wear Seat belts while driving.   Please note  You were cared for by a hospitalist during your hospital stay. If you have any questions about your discharge medications or the care you received while you were in the hospital after you are discharged, you can call the unit and asked to speak with the hospitalist on call if the hospitalist that took care of you is not available. Once you are discharged, your primary care physician will handle any further medical issues. Please note that NO REFILLS for any discharge medications will be authorized once you are discharged, as it is imperative that you return to your primary care physician (or establish a relationship with a primary care physician if you do not have one) for your aftercare needs so that they can reassess your need for medications and monitor your lab values.   Discharge wound care:   Complete by:  As directed    - The plan is to leave the wound VAC intact for 1 week postoperatively.  Patient can go home with the home Provena Vac. Leave dressings clean, dry, and intact for 1 week.  - Weightbearing in an immobilization cam boot   Increase activity slowly   Complete by:  As directed      - Weightbearing in an immobilization cam boot     Allergies as of 05/19/2018   No Known Allergies     Medication List    STOP taking these medications   Insulin Glargine 100 UNIT/ML Solostar Pen Commonly known as:  LANTUS   insulin lispro 100 UNIT/ML injection Commonly known as:  HUMALOG   ketoconazole 2 % cream Commonly known as:  NIZORAL   mupirocin ointment 2 %  Commonly known as:  BACTROBAN   silver sulfADIAZINE 1 % cream Commonly known as:  SILVADENE   tiZANidine 4 MG tablet Commonly known as:  ZANAFLEX     TAKE these medications   amoxicillin-clavulanate 875-125 MG tablet Commonly known as:  AUGMENTIN Take 1 tablet by mouth 2 (two) times daily for 9 days.   blood glucose meter kit and supplies Dispense based on patient and insurance preference. Use up to four times daily as directed. (FOR ICD-9 250.00, 250.01).   insulin aspart protamine - aspart (70-30) 100 UNIT/ML FlexPen Commonly known as:  NOVOLOG 70/30 MIX Inject 0.15 mLs (15 Units total) into the skin 2 (two) times daily.   Insulin Pen Needle 31G X 5 MM Misc Use daily to inject insulin as instructed   lactobacillus acidophilus & bulgar chewable tablet Chew 1 tablet by mouth 3 (three) times daily with meals.   lisinopril 2.5 MG tablet Commonly known as:  PRINIVIL,ZESTRIL Take 1 tablet (2.5 mg total) by mouth daily. Start taking on:  05/20/2018   magic mouthwash w/lidocaine Soln Take 5 mLs by mouth 3 (three) times daily as needed for mouth pain.   sitaGLIPtin-metformin 50-1000 MG tablet Commonly known as:  JANUMET Take 1 tablet by mouth 2 (two) times daily with a meal.   traMADol 50 MG tablet Commonly known as:  ULTRAM Take 1 tablet (50 mg total) by mouth every 12 (twelve) hours as needed for severe pain. What changed:    when to take this  reasons to take this            Discharge Care Instructions  (From admission, onward)         Start     Ordered   05/19/18 0000  Discharge  wound care:    Comments:  - The plan is to leave the wound VAC intact for 1 week postoperatively.  Patient can go home with the home Provena Vac. Leave dressings clean, dry, and intact for 1 week.  - Weightbearing in an immobilization cam boot   05/19/18 1152            Diet and Activity recommendation: See Discharge Instructions above   Consults obtained -  Podiatry   Major procedures and Radiology Reports - PLEASE review detailed and final reports for all details, in brief -   s/pPartial fifth ray amputation right foot secondary to osteomyelitis and diabetic foot ulcer.  By Dr. Daylene Katayama DOS 05/16/18   Mr Foot Right W Wo Contrast  Result Date: 05/14/2018 CLINICAL DATA:  47 year old male with history of diabetes and chronic right foot ulcer adjacent to the fifth toe presents with worsening erythema. Foot ulcer has been present for more than 6 months. EXAM: MRI OF THE RIGHT FOREFOOT WITHOUT AND WITH CONTRAST TECHNIQUE: Multiplanar, multisequence MR imaging of the right forefoot was performed before and after the administration of intravenous contrast. CONTRAST:  10 mL Gadavist COMPARISON:  Foot radiographs from the same day FINDINGS: Bones/Joint/Cartilage Marrow edema and subtle cortical bone loss involving the base of the right fifth proximal phalanx consistent with acute osteomyelitis. The first through fifth rays are otherwise unremarkable in appearance. Ligaments Noncontributory Muscles and Tendons No pyomyositis. Intact flexor and extensor tendons crossing the forefoot. Soft tissues Moderate degree of inflammatory soft tissue swelling and edema involving the base of the right fifth toe with soft tissue ulceration. More generalized diffuse soft tissue swelling of the forefoot more so along the plantar and to a lesser degree dorsum of the  forefoot. IMPRESSION: Soft tissue ulcer with marrow edema and subtle cortical bone loss involving the fifth toe and fifth proximal phalanx consistent  with acute osteomyelitis. Electronically Signed   By: Ashley Royalty M.D.   On: 05/14/2018 17:30   Dg Foot Complete Right  Result Date: 05/16/2018 CLINICAL DATA:  Status post amputation EXAM: RIGHT FOOT COMPLETE - 3+ VIEW COMPARISON:  MRI 05/14/2018, radiograph 05/14/2018, 10/16/2017 FINDINGS: Interval fifth ray amputation at the level of the proximal fifth metatarsal. Cutaneous staples and drain over the lateral aspect of the foot. Cut margin is smooth. No acute fracture or malalignment. No radiopaque foreign body. IMPRESSION: Interval amputation of fifth digit at the level of the proximal fifth metatarsal with expected postsurgical changes Electronically Signed   By: Donavan Foil M.D.   On: 05/16/2018 18:21   Dg Foot Complete Right  Result Date: 05/14/2018 CLINICAL DATA:  Diabetic ulcer lateral right foot EXAM: RIGHT FOOT COMPLETE - 3+ VIEW COMPARISON:  10/16/2017 FINDINGS: Soft tissue ulcer noted over the right 5th MTP joint. Lucency noted at the base of the right 5th toe proximal phalanx could reflect fracture or osteomyelitis. No additional acute bony abnormality. IMPRESSION: Soft tissue defect over the right 5th metatarsal compatible with soft tissue ulcer. Lucency at the base of the right 5th toe proximal phalanx could reflect fracture or osteomyelitis. Electronically Signed   By: Rolm Baptise M.D.   On: 05/14/2018 12:54    Micro Results     Recent Results (from the past 240 hour(s))  Blood culture (routine x 2)     Status: None   Collection Time: 05/14/18  3:36 PM  Result Value Ref Range Status   Specimen Description BLOOD LEFT ARM  Final   Special Requests   Final    BOTTLES DRAWN AEROBIC ONLY Blood Culture results may not be optimal due to an inadequate volume of blood received in culture bottles   Culture   Final    NO GROWTH 5 DAYS Performed at [facility], [place] [address] [place], [place] [address]    Report Status 05/19/2018 FINAL  Final  Blood culture (routine x 2)      Status: Abnormal   Collection Time: 05/14/18  6:05 PM  Result Value Ref Range Status   Specimen Description BLOOD RIGHT HAND  Final   Special Requests   Final    BOTTLES DRAWN AEROBIC ONLY Blood Culture adequate volume   Culture  Setup Time   Final    GRAM POSITIVE COCCI IN CHAINS AEROBIC BOTTLE ONLY CRITICAL RESULT CALLED TO, READ BACK BY AND VERIFIED WITH: J. LEDFORD PHARMD, AT 2317 9/277/19 BY D. VANHOOK    Culture (A)  Final    VIRIDANS STREPTOCOCCUS THE SIGNIFICANCE OF ISOLATING THIS ORGANISM FROM A SINGLE SET OF BLOOD CULTURES WHEN MULTIPLE SETS ARE DRAWN IS UNCERTAIN. PLEASE NOTIFY THE MICROBIOLOGY DEPARTMENT WITHIN ONE WEEK IF SPECIATION AND SENSITIVITIES ARE REQUIRED. Performed at [facility], [place] [address] [place],  [address]    Report Status 05/17/2018 FINAL  Final  Blood Culture ID Panel (Reflexed)     Status: Abnormal   Collection Time: 05/14/18  6:05 PM  Result Value Ref Range Status   Enterococcus species NOT DETECTED NOT DETECTED Final   Listeria monocytogenes NOT DETECTED NOT DETECTED Final   Staphylococcus species NOT DETECTED NOT DETECTED Final   Staphylococcus aureus NOT DETECTED NOT DETECTED Final   Streptococcus species DETECTED (A) NOT DETECTED Final    Comment: Not Enterococcus  species, Streptococcus agalactiae, Streptococcus pyogenes, or Streptococcus pneumoniae. CRITICAL RESULT CALLED TO, READ BACK BY AND VERIFIED WITH: J. LEDFORD PHARMD, AT 2317 9/277/19 BY D. VANHOOK    Streptococcus agalactiae NOT DETECTED NOT DETECTED Final   Streptococcus pneumoniae NOT DETECTED NOT DETECTED Final   Streptococcus pyogenes NOT DETECTED NOT DETECTED Final   Acinetobacter baumannii NOT DETECTED NOT DETECTED Final   Enterobacteriaceae species NOT DETECTED NOT DETECTED Final   Enterobacter cloacae complex NOT DETECTED NOT DETECTED Final   Escherichia coli NOT DETECTED NOT DETECTED Final   Klebsiella oxytoca NOT DETECTED NOT DETECTED Final   Klebsiella  pneumoniae NOT DETECTED NOT DETECTED Final   Proteus species NOT DETECTED NOT DETECTED Final   Serratia marcescens NOT DETECTED NOT DETECTED Final   Haemophilus influenzae NOT DETECTED NOT DETECTED Final   Neisseria meningitidis NOT DETECTED NOT DETECTED Final   Pseudomonas aeruginosa NOT DETECTED NOT DETECTED Final   Candida albicans NOT DETECTED NOT DETECTED Final   Candida glabrata NOT DETECTED NOT DETECTED Final   Candida krusei NOT DETECTED NOT DETECTED Final   Candida parapsilosis NOT DETECTED NOT DETECTED Final   Candida tropicalis NOT DETECTED NOT DETECTED Final    Comment: Performed at [facility], [place]. [address], [place], [place] [address]  Surgical pcr screen     Status: None   Collection Time: 05/15/18  6:54 PM  Result Value Ref Range Status   MRSA, PCR NEGATIVE NEGATIVE Final   Staphylococcus aureus NEGATIVE NEGATIVE Final    Comment: (NOTE) The Xpert SA Assay (FDA approved for NASAL specimens in patients 73 years of age and older), is one component of a comprehensive surveillance program. It is not intended to diagnose infection nor to guide or monitor treatment. Performed at [facility], [place] [address], [place], [place] [address]   Aerobic/Anaerobic Culture (surgical/deep wound)     Status: None (Preliminary result)   Collection Time: 05/16/18  3:40 PM  Result Value Ref Range Status   Specimen Description WOUND RIGHT FOOT  Final   Special Requests PT ON MAXIPIME  Final   Gram Stain NO WBC SEEN NO ORGANISMS SEEN   Final   Culture   Final    FEW NORMAL SKIN FLORA Performed at [facility], [address], [place], [place] [address]    Report Status PENDING  Incomplete  Culture, blood (Routine X 2) w Reflex to ID Panel     Status: None (Preliminary result)   Collection Time: 05/17/18  7:32 AM  Result Value Ref Range Status   Specimen Description BLOOD RIGHT ANTECUBITAL  Final   Special Requests   Final    BOTTLES DRAWN AEROBIC ONLY Blood  Culture adequate volume   Culture   Final    NO GROWTH 2 DAYS Performed at [facility], [place] [address], [place], [place] [address]    Report Status PENDING  Incomplete  Culture, blood (Routine X 2) w Reflex to ID Panel     Status: None (Preliminary result)   Collection Time: 05/17/18  7:36 AM  Result Value Ref Range Status   Specimen Description BLOOD LEFT HAND  Final   Special Requests   Final    BOTTLES DRAWN AEROBIC AND ANAEROBIC Blood Culture adequate volume   Culture   Final    NO GROWTH 2 DAYS Performed at [facility], [place] [address] [place], [place] [address]    Report Status PENDING  Incomplete  Today   Subjective:   Edward [name] today has no headache,no chest abdominal pain,no new weakness tingling or numbness, feels much better wants to go home today.   Objective:   Blood pressure (!) 155/89, pulse 81, temperature 98.1 F (36.7 C), temperature source Oral, resp. rate 16, height _0  (1.854 m), weight 98.8 kg, SpO2 99 %.   Intake/Output Summary (Last 24 hours) at 05/19/2018 1155 Last data filed at 05/19/2018 1000 Gross per 24 hour  Intake 480 ml  Output 300 ml  Net 180 ml    Exam Awake Alert, Oriented x 3, No new F.N deficits, Normal affect  Symmetrical Chest wall movement, Good air movement bilaterally, CTAB RRR,No Gallops,Rubs or new Murmurs, No Parasternal Heave +ve B.Sounds, Abd Soft, Non tender, No rebound -guarding or rigidity. Old left transmetatarsal amputation, right foot status post chest x-ray amputation currently on wound VAC with minimal with an VAC container.  Data Review   CBC w Diff:  Lab Results  Component Value Date   WBC 7.1 05/18/2018   HGB 10.9 (L) 05/18/2018   HCT 34.0 (L) 05/18/2018   PLT 206 05/18/2018   LYMPHOPCT 19 05/14/2018   MONOPCT 9 05/14/2018   EOSPCT 2 05/14/2018   BASOPCT 1 05/14/2018    CMP:  Lab Results  Component Value Date   NA 135 05/18/2018   K 4.0 05/18/2018   CL 110 05/18/2018    CO2 20 (L) 05/18/2018   BUN 14 05/18/2018   CREATININE 1.20 05/18/2018   PROT 6.1 (L) 05/15/2018   ALBUMIN 2.6 (L) 05/15/2018   BILITOT 0.8 05/15/2018   ALKPHOS 56 05/15/2018   AST 15 05/15/2018   ALT 12 05/15/2018  .   Total Time in preparing paper work, data evaluation and todays exam - 62 minutes  Phillips Climes M.D on 05/19/2018 at 11:55 AM  [facility]   Office  [phone]

## 2018-05-19 NOTE — Discharge Instructions (Signed)
Follow with Primary MD [name], MD in 7 days   Get CBC, CMP,  checked  by Primary MD next visit.   - Weightbearing in an immobilization cam boot   Disposition Home   Diet: Heart Healthy ,carbmodified , with feeding assistance and aspiration precautions.   On your next visit with your primary care physician please Get Medicines reviewed and adjusted.   Please request your Prim.MD to go over all Hospital Tests and Procedure/Radiological results at the follow up, please get all Hospital records sent to your Prim MD by signing hospital release before you go home.   If you experience worsening of your admission symptoms, develop shortness of breath, life threatening emergency, suicidal or homicidal thoughts you must seek medical attention immediately by calling 911 or calling your MD immediately  if symptoms less severe.  You Must read complete instructions/literature along with all the possible adverse reactions/side effects for all the Medicines you take and that have been prescribed to you. Take any new Medicines after you have completely understood and accpet all the possible adverse reactions/side effects.   Do not drive, operating heavy machinery, perform activities at heights, swimming or participation in water activities or provide baby sitting services if your were admitted for syncope or siezures until you have seen by Primary MD or a Neurologist and advised to do so again.  Do not drive when taking Pain medications.    Do not take more than prescribed Pain, Sleep and Anxiety Medications  Special Instructions: If you have smoked or chewed Tobacco  in the last 2 yrs please stop smoking, stop any regular Alcohol  and or any Recreational drug use.  Wear Seat belts while driving.   Please note  You were cared for by a hospitalist during your hospital stay. If you have any questions about your discharge medications or the care you received while you were in the hospital  after you are discharged, you can call the unit and asked to speak with the hospitalist on call if the hospitalist that took care of you is not available. Once you are discharged, your primary care physician will handle any further medical issues. Please note that NO REFILLS for any discharge medications will be authorized once you are discharged, as it is imperative that you return to your primary care physician (or establish a relationship with a primary care physician if you do not have one) for your aftercare needs so that they can reassess your need for medications and monitor your lab values.

## 2018-05-19 NOTE — Telephone Encounter (Signed)
Message received from Venita Sheffield, RN CM requesting assistance for patient post discharge. This CM spoke to Ricki Miller, RN CM who noted that the patient is ready for discharge. He already has a PCP and has been given a MATCH letter to obtain his medications.

## 2018-05-19 NOTE — Evaluation (Addendum)
Occupational Therapy Evaluation Patient Details Name: Edward [name] MRN: [id number] DOB: 09/13/1970 Today's Date: 05/19/2018    History of Present Illness Pt is a 47 y/o male admitted secondary to R foot osteomyelitis. Pt is s/p R partial 5th ray amputation. PMH includes DM, HTN, and L transmet amputation.    Clinical Impression   PTA, pt was living with his children and was independent with BADLs and using a cane for functional mobility. Currently, pt requires Min Guard A for LB ADLs and functional mobility using RW; requiring Min A for managing CAM boot. Provided education on LB ADLs (with wound vac), CAM boot management, toilet transfer, and shower transfer with 3N1; pt demonstrated understanding. Answered all pt questions. Pt planning for dc later today. Recommend dc home once medically stable per physician. All acute OT needs met and will sign off. Thank you.     Follow Up Recommendations  No OT follow up;Supervision/Assistance - 24 hour    Equipment Recommendations  None recommended by OT    Recommendations for Other Services PT consult     Precautions / Restrictions Precautions Precautions: Fall;Other (comment) Precaution Comments: wound vac  Required Braces or Orthoses: Other Brace/Splint Other Brace/Splint: R CAM walker boot  Restrictions Weight Bearing Restrictions: Yes RLE Weight Bearing: Weight bearing as tolerated Other Position/Activity Restrictions: WBAT in CAM walker boot, educated on using shoe with spacer insert to level out standing.        Mobility Bed Mobility Overal bed mobility: Needs Assistance Bed Mobility: Supine to Sit     Supine to sit: Supervision     General bed mobility comments: Supervision for safety. Increased time required.   Transfers Overall transfer level: Needs assistance Equipment used: Rolling walker (2 wheeled) Transfers: Sit to/from Stand Sit to Stand: Min guard         General transfer comment: Min Guard A for  safety    Balance Overall balance assessment: Needs assistance Sitting-balance support: No upper extremity supported;Feet supported Sitting balance-Leahy Scale: Good     Standing balance support: Bilateral upper extremity supported;During functional activity Standing balance-Leahy Scale: Poor Standing balance comment: Reliant on BUE support                            ADL either performed or assessed with clinical judgement   ADL Overall ADL's : Needs assistance/impaired Eating/Feeding: Set up;Supervision/ safety;Sitting   Grooming: Set up;Supervision/safety;Standing   Upper Body Bathing: Set up;Supervision/ safety;Sitting   Lower Body Bathing: Min guard;Sit to/from stand   Upper Body Dressing : Set up;Supervision/safety;Sitting   Lower Body Dressing: Minimal assistance;Sit to/from stand Lower Body Dressing Details (indicate cue type and reason): Min A for managing CAM walker boot. Providing education on LB dressing and managing wound vac. Pt verbalized understanding.  Toilet Transfer: Min guard;Ambulation;RW(Simulated in room)       Tub/ Shower Transfer: Walk-in shower;Ambulation;3 in 1;Rolling walker;Min guard Tub/Shower Transfer Details (indicate cue type and reason): Providing pt with education on shower transfer and use of 3N1 as shower chair. Pt demonstrating understanding and performing with Min Guard A for safety. Educating pt will need to sponge bath with wound vac.    General ADL Comments: Pt requiring MIn Guard A for safety. Providing education on LB ADLs (with wound vac and CAM boot), toileting, and shower transfer.      Vision         Perception     Praxis  Pertinent Vitals/Pain Pain Assessment: Faces Pain Score: 7  Faces Pain Scale: Hurts little more Pain Location: R foot  Pain Descriptors / Indicators: Aching;Operative site guarding Pain Intervention(s): Monitored during session;Limited activity within patient's tolerance;Repositioned      Hand Dominance Right   Extremity/Trunk Assessment Upper Extremity Assessment Upper Extremity Assessment: Overall WFL for tasks assessed   Lower Extremity Assessment Lower Extremity Assessment: Defer to PT evaluation RLE Deficits / Details: Deficits consistent with post op pain and weakness.  LLE Deficits / Details: L transmetatarsal amputation.    Cervical / Trunk Assessment Cervical / Trunk Assessment: Normal   Communication Communication Communication: No difficulties   Cognition Arousal/Alertness: Awake/alert Behavior During Therapy: WFL for tasks assessed/performed Overall Cognitive Status: Within Functional Limits for tasks assessed                                     General Comments       Exercises     Shoulder Instructions      Home Living Family/patient expects to be discharged to:: Private residence Living Arrangements: Children Available Help at Discharge: Family;Available 24 hours/day Type of Home: House Home Access: Stairs to enter [employer] of Steps: 1(porch step ) Entrance Stairs-Rails: Left Home Layout: One level     Bathroom Shower/Tub: Occupational psychologist: Standard     Home Equipment: Environmental consultant - 2 wheels;Cane - single point;Bedside commode;Wheelchair - manual          Prior Functioning/Environment Level of Independence: Independent with assistive device(s)        Comments: Was using cane for ambulation         OT Problem List: Decreased range of motion;Decreased activity tolerance;Decreased strength;Impaired balance (sitting and/or standing);Decreased knowledge of use of DME or AE;Decreased knowledge of precautions;Pain      OT Treatment/Interventions:      OT Goals(Current goals can be found in the care plan section) Acute Rehab OT Goals Patient Stated Goal: to go home  OT Goal Formulation: All assessment and education complete, DC therapy  OT Frequency:     Barriers to D/C:             Co-evaluation              AM-PAC PT "6 Clicks" Daily Activity     Outcome Measure Help from another person eating meals?: None Help from another person taking care of personal grooming?: None Help from another person toileting, which includes using toliet, bedpan, or urinal?: A Little Help from another person bathing (including washing, rinsing, drying)?: A Little Help from another person to put on and taking off regular upper body clothing?: None Help from another person to put on and taking off regular lower body clothing?: A Little 6 Click Score: 21   End of Session Equipment Utilized During Treatment: Rolling walker;Other (comment)(CAM boot) Nurse Communication: Mobility status;Weight bearing status;Precautions  Activity Tolerance: Patient tolerated treatment well Patient left: in bed;with call bell/phone within reach  OT Visit Diagnosis: Unsteadiness on feet (R26.81);Other abnormalities of gait and mobility (R26.89);Muscle weakness (generalized) (M62.81);Pain Pain - Right/Left: Right Pain - part of body: Ankle and joints of foot                Time: 1325-1349 OT Time Calculation (min): 24 min Charges:  OT General Charges $OT Visit: 1 Visit OT Evaluation $OT Eval Moderate Complexity: 1 Mod OT Treatments $Self Care/Home  Management : 8-22 mins  [place], OTR/L Acute Rehab Pager: [phone] Office: [place] 05/19/2018, 3:13 PM

## 2018-05-19 NOTE — Care Management Note (Signed)
Case Management Note  Patient Details  Name: GAELAN GLENNON MRN: [id number] Date of Birth: 09/25/1970  Subjective/Objective:  47 yr old gentleman s/p partial ray amputation of fifth toe right foot.                   Action/Plan: Case manager spoke with patient concerning discharge needs. Patient provided with [facility] letter, referred to    Expected Discharge Date:  05/19/18               Expected Discharge Plan:  [place]  In-House Referral:  NA  Discharge planning Services  CM Consult, Medication Assistance, [facility], [facility]  Post Acute Care Choice:  Home Health Choice offered to:  NA(patient uninsured)  DME Arranged:  N/A DME Agency:     HH Arranged:  PT HH Agency:  [facility]  Status of Service:  In process, will continue to follow  If discussed at Long Length of Stay Meetings, dates discussed:    Additional Comments:  Ninfa Meeker, RN 05/19/2018, 12:45 PM

## 2018-05-20 ENCOUNTER — Encounter (HOSPITAL_COMMUNITY): Payer: Self-pay | Admitting: Podiatry

## 2018-05-21 ENCOUNTER — Telehealth (INDEPENDENT_AMBULATORY_CARE_PROVIDER_SITE_OTHER): Payer: Self-pay | Admitting: Orthopaedic Surgery

## 2018-05-21 LAB — AEROBIC/ANAEROBIC CULTURE W GRAM STAIN (SURGICAL/DEEP WOUND): Culture: NORMAL

## 2018-05-21 LAB — AEROBIC/ANAEROBIC CULTURE (SURGICAL/DEEP WOUND): GRAM STAIN: NONE SEEN

## 2018-05-21 NOTE — Telephone Encounter (Signed)
02/17/2018 ov note faxed to [facility] [phone], Ph Heather [phone]

## 2018-05-22 ENCOUNTER — Ambulatory Visit (INDEPENDENT_AMBULATORY_CARE_PROVIDER_SITE_OTHER): Payer: Self-pay | Admitting: Podiatry

## 2018-05-22 ENCOUNTER — Ambulatory Visit (INDEPENDENT_AMBULATORY_CARE_PROVIDER_SITE_OTHER): Payer: Medicaid Other

## 2018-05-22 ENCOUNTER — Encounter (HOSPITAL_BASED_OUTPATIENT_CLINIC_OR_DEPARTMENT_OTHER): Payer: Medicaid Other | Attending: Internal Medicine

## 2018-05-22 VITALS — BP 160/100 | HR 81 | Temp 97.6°F

## 2018-05-22 DIAGNOSIS — L97519 Non-pressure chronic ulcer of other part of right foot with unspecified severity: Secondary | ICD-10-CM

## 2018-05-22 LAB — CULTURE, BLOOD (ROUTINE X 2)
CULTURE: NO GROWTH
Culture: NO GROWTH
Special Requests: ADEQUATE
Special Requests: ADEQUATE

## 2018-05-24 NOTE — Progress Notes (Signed)
Subjective: Edward [name] is a 47 y.o. is seen today in office s/p right foot with partial resection preformed on 05/16/2018 with Dr. Amalia Hailey.  He states he has some pain now as the wound VAC has been removed otherwise his pain is been controlled.  Denies any systemic complaints such as fevers, chills, nausea, vomiting. No calf pain, chest pain, shortness of breath.   Objective: General: No acute distress, AAOx3  DP/PT pulses palpable 2/4, CRT < 3 sec to all digits.  Protective sensation intact. Motor function intact.  Right foot: Incision is well coapted without any evidence of dehiscence on the proximal aspect.  Granular wound is present with the distal portion.  There is no probing to bone.  Some mild macerated periwound.  There is no fluctuation or crepitation.  There is no malodor.  No drainage or pus.  No significant callus palpable some surgical site. No other areas of tenderness to bilateral lower extremities.  No other open lesions or pre-ulcerative lesions.  No pain with calf compression, swelling, warmth, erythema.   Assessment and Plan:  Status post right foot partial fifth ray resection, doing well with no complications   -Treatment options discussed including all alternatives, risks, and complications -X-rays were obtained reviewed.  Status post partial fifth ray resection with sharp margins.  No evidence of acute fracture identified. -Small amount of Betadine was painted to the incision and Medihoney was applied to the wound.  Followed by dry sterile dressing.  Keep the dressing clean, dry, intact. -Elevation -Pain medication as needed. -Monitor for any clinical signs or symptoms of infection and DVT/PE and directed to call the office immediately should any occur or go to the ER. -Follow-up as scheduled for dressing change or sooner if any problems arise. In the meantime, encouraged to call the office with any questions, concerns, change in symptoms.   Celesta Gentile, DPM

## 2018-05-25 ENCOUNTER — Ambulatory Visit (INDEPENDENT_AMBULATORY_CARE_PROVIDER_SITE_OTHER): Payer: Self-pay | Admitting: Podiatry

## 2018-05-25 ENCOUNTER — Encounter: Payer: Self-pay | Admitting: Podiatry

## 2018-05-25 DIAGNOSIS — L97519 Non-pressure chronic ulcer of other part of right foot with unspecified severity: Secondary | ICD-10-CM

## 2018-05-25 DIAGNOSIS — Z09 Encounter for follow-up examination after completed treatment for conditions other than malignant neoplasm: Secondary | ICD-10-CM

## 2018-05-25 MED ORDER — TRAMADOL HCL 50 MG PO TABS: 50.0000 mg | ORAL_TABLET | Freq: Three times a day (TID) | ORAL | 0 refills | Status: DC | PRN

## 2018-05-25 NOTE — Progress Notes (Signed)
   Subjective:  Patient presents today status post partial fifth ray amputation right foot. DOS: 05/16/2018.  Patient doing well.  Patient currently uninsured and has been changing dressings at home by himself.  Apparently he was supposed to have home health dressing changes arranged prior to discharge, however arrangements were not made.  He presents for follow-up treatment evaluation  Past Medical History:  Diagnosis Date  . Anxiety    self reported  . Arthritis   . Depression    self reported  . Diabetes mellitus   . Hypertension       Objective/Physical Exam Vascular status intact.  Staples along the incision site are well coapted and intact.  Mild malodor noted.  Ulceration to the surgical amputation site appears stable.  No exposed bone noted.  There is some superficial stable eschar noted.  Periwound is intact.  Minimal amount of serosanguineous drainage noted.  Wound measures approximately 3.0 x 2.5 x 0.2 cm (LxWxD).   Assessment: 1. s/p partial fifth ray amputation right foot. DOS: 05/16/2018   Plan of Care:  1. Patient was evaluated.  2.  Area painted with Betadine.  Dry sterile dressing applied. 3.  Prescription for tramadol 50 mg as needed pain 4.  Gauze and dressing supplies were provided to the patient today.  Instructed the patient to apply Betadine and dry sterile dressing daily to the ulceration site and surgical incision site. 5.  Continue weightbearing in the immobilization cam boot 6.  Return to clinic in 10 days   [name], DPM [facility]  Dr. [name], [address]                                        [place], [place] [address]                Office [phone]  Fax [phone]

## 2018-06-01 ENCOUNTER — Telehealth: Payer: Self-pay | Admitting: Podiatry

## 2018-06-01 NOTE — Telephone Encounter (Signed)
I had surgery by Dr. Amalia Hailey on 28 September and I have a question. Please call me back at [phone].

## 2018-06-01 NOTE — Telephone Encounter (Signed)
Left message for pt to call concerning the questions he had about his foot.

## 2018-06-01 NOTE — Telephone Encounter (Signed)
I called pt, he states it looks like the staples are pulling through. I told pt I would have a scheduler contact him tomorrow to get an appt to be seen same day.

## 2018-06-08 ENCOUNTER — Ambulatory Visit (INDEPENDENT_AMBULATORY_CARE_PROVIDER_SITE_OTHER): Payer: Medicaid Other

## 2018-06-08 ENCOUNTER — Ambulatory Visit (INDEPENDENT_AMBULATORY_CARE_PROVIDER_SITE_OTHER): Payer: Medicaid Other | Admitting: Podiatry

## 2018-06-08 ENCOUNTER — Telehealth (INDEPENDENT_AMBULATORY_CARE_PROVIDER_SITE_OTHER): Payer: Self-pay | Admitting: Orthopaedic Surgery

## 2018-06-08 ENCOUNTER — Encounter: Payer: Self-pay | Admitting: Podiatry

## 2018-06-08 DIAGNOSIS — E0843 Diabetes mellitus due to underlying condition with diabetic autonomic (poly)neuropathy: Secondary | ICD-10-CM | POA: Diagnosis not present

## 2018-06-08 DIAGNOSIS — L97519 Non-pressure chronic ulcer of other part of right foot with unspecified severity: Secondary | ICD-10-CM

## 2018-06-08 DIAGNOSIS — L97511 Non-pressure chronic ulcer of other part of right foot limited to breakdown of skin: Secondary | ICD-10-CM

## 2018-06-08 DIAGNOSIS — L97512 Non-pressure chronic ulcer of other part of right foot with fat layer exposed: Secondary | ICD-10-CM

## 2018-06-08 DIAGNOSIS — I70235 Atherosclerosis of native arteries of right leg with ulceration of other part of foot: Secondary | ICD-10-CM | POA: Diagnosis not present

## 2018-06-08 MED ORDER — SULFAMETHOXAZOLE-TRIMETHOPRIM 800-160 MG PO TABS: 1.0000 | ORAL_TABLET | Freq: Two times a day (BID) | ORAL | 0 refills | Status: DC

## 2018-06-08 NOTE — Progress Notes (Signed)
   Subjective:  Patient presents today status post partial fifth ray amputation right foot. DOS: 05/16/2018.  Patient states that he is noticed increased pain since last visit.  He is also noticed some purulent drainage coming from the amputation stump.  He presents for further treatment evaluation  Past Medical History:  Diagnosis Date  . Anxiety    self reported  . Arthritis   . Depression    self reported  . Diabetes mellitus   . Hypertension      Objective/Physical Exam Vascular status intact.  Staples along the incision site are well coapted and intact.  Moderate malodor noted.  Eschar noted to the open wound site with purulent drainage.  No surrounding erythema or edema noted.  Moderate amount of serosanguineous drainage noted.  Wound measures approximately 3.0 x 2.5 x 0.2 cm (LxWxD).   Assessment: 1. s/p partial fifth ray amputation right foot. DOS: 05/16/2018   Plan of Care:  1. Patient was evaluated.  2.  Continue daily application of Betadine with dry sterile dressing.  Continue weightbearing in the cam boot 3.  Medically necessary excisional debridement including muscle and deep fascial tissue was performed using a tissue nipper.  Excisional debridement of all the necrotic nonviable tissue down to healthy bleeding viable tissue was performed with post debridement measurement same as pre-. 4.  New cultures were taken today.  Dry sterile dressing applied 5.  Prescription for Bactrim DS #28 twice daily 6.  I explained the patient that he should go directly to the emergency department if symptoms worsen.  I also explained to the patient that he is at high risk of additional surgery or additional amputation. 7.  Return to clinic in 1 week  [name], DPM [facility]  Dr. [name], [address]                                        [place], [place] [address]                Office [phone]  Fax [phone]

## 2018-06-08 NOTE — Telephone Encounter (Signed)
Edward [name] w/ [facility] called for updated records. IC her back, lmvm advised no new records to send. Patient last seen 02/2018 and that was faxed to her 10/3

## 2018-06-11 LAB — WOUND CULTURE
GRAM STAIN:: NONE SEEN
MICRO NUMBER: 91262285
SPECIMEN QUALITY:: ADEQUATE

## 2018-06-15 ENCOUNTER — Encounter: Payer: Self-pay | Admitting: Podiatry

## 2018-06-15 ENCOUNTER — Ambulatory Visit (INDEPENDENT_AMBULATORY_CARE_PROVIDER_SITE_OTHER): Payer: Self-pay | Admitting: Podiatry

## 2018-06-15 VITALS — BP 183/109 | HR 119 | Temp 99.9°F | Resp 16

## 2018-06-15 DIAGNOSIS — M86071 Acute hematogenous osteomyelitis, right ankle and foot: Secondary | ICD-10-CM

## 2018-06-15 DIAGNOSIS — L97512 Non-pressure chronic ulcer of other part of right foot with fat layer exposed: Secondary | ICD-10-CM

## 2018-06-15 MED ORDER — AMPICILLIN 500 MG PO CAPS: 500.0000 mg | ORAL_CAPSULE | Freq: Four times a day (QID) | ORAL | 0 refills | Status: DC

## 2018-06-16 ENCOUNTER — Telehealth: Payer: Self-pay | Admitting: Podiatry

## 2018-06-16 ENCOUNTER — Other Ambulatory Visit: Payer: Self-pay

## 2018-06-16 ENCOUNTER — Encounter (HOSPITAL_COMMUNITY): Payer: Self-pay | Admitting: *Deleted

## 2018-06-16 ENCOUNTER — Telehealth: Payer: Self-pay

## 2018-06-16 ENCOUNTER — Emergency Department (HOSPITAL_COMMUNITY): Payer: Medicaid Other

## 2018-06-16 ENCOUNTER — Inpatient Hospital Stay (HOSPITAL_COMMUNITY)
Admission: EM | Admit: 2018-06-16 | Discharge: 2018-06-22 | DRG: 616 | Disposition: A | Discharge status: Rehabilitation Facility | Payer: Medicaid Other | Attending: Family Medicine | Admitting: Family Medicine

## 2018-06-16 DIAGNOSIS — Z6828 Body mass index (BMI) 28.0-28.9, adult: Secondary | ICD-10-CM | POA: Diagnosis not present

## 2018-06-16 DIAGNOSIS — M869 Osteomyelitis, unspecified: Secondary | ICD-10-CM | POA: Diagnosis not present

## 2018-06-16 DIAGNOSIS — S88111A Complete traumatic amputation at level between knee and ankle, right lower leg, initial encounter: Secondary | ICD-10-CM | POA: Diagnosis not present

## 2018-06-16 DIAGNOSIS — E1169 Type 2 diabetes mellitus with other specified complication: Secondary | ICD-10-CM | POA: Diagnosis not present

## 2018-06-16 DIAGNOSIS — L97519 Non-pressure chronic ulcer of other part of right foot with unspecified severity: Secondary | ICD-10-CM | POA: Diagnosis present

## 2018-06-16 DIAGNOSIS — R6520 Severe sepsis without septic shock: Secondary | ICD-10-CM

## 2018-06-16 DIAGNOSIS — K5900 Constipation, unspecified: Secondary | ICD-10-CM | POA: Diagnosis present

## 2018-06-16 DIAGNOSIS — M86271 Subacute osteomyelitis, right ankle and foot: Secondary | ICD-10-CM | POA: Diagnosis not present

## 2018-06-16 DIAGNOSIS — N183 Chronic kidney disease, stage 3 (moderate): Secondary | ICD-10-CM | POA: Diagnosis present

## 2018-06-16 DIAGNOSIS — E11621 Type 2 diabetes mellitus with foot ulcer: Secondary | ICD-10-CM | POA: Diagnosis present

## 2018-06-16 DIAGNOSIS — Z89421 Acquired absence of other right toe(s): Secondary | ICD-10-CM | POA: Diagnosis not present

## 2018-06-16 DIAGNOSIS — D638 Anemia in other chronic diseases classified elsewhere: Secondary | ICD-10-CM | POA: Diagnosis not present

## 2018-06-16 DIAGNOSIS — E43 Unspecified severe protein-calorie malnutrition: Secondary | ICD-10-CM | POA: Diagnosis not present

## 2018-06-16 DIAGNOSIS — M86671 Other chronic osteomyelitis, right ankle and foot: Secondary | ICD-10-CM | POA: Diagnosis not present

## 2018-06-16 DIAGNOSIS — I129 Hypertensive chronic kidney disease with stage 1 through stage 4 chronic kidney disease, or unspecified chronic kidney disease: Secondary | ICD-10-CM | POA: Diagnosis not present

## 2018-06-16 DIAGNOSIS — E1142 Type 2 diabetes mellitus with diabetic polyneuropathy: Secondary | ICD-10-CM | POA: Diagnosis present

## 2018-06-16 DIAGNOSIS — D62 Acute posthemorrhagic anemia: Secondary | ICD-10-CM | POA: Diagnosis not present

## 2018-06-16 DIAGNOSIS — I10 Essential (primary) hypertension: Secondary | ICD-10-CM | POA: Diagnosis not present

## 2018-06-16 DIAGNOSIS — N179 Acute kidney failure, unspecified: Secondary | ICD-10-CM

## 2018-06-16 DIAGNOSIS — F329 Major depressive disorder, single episode, unspecified: Secondary | ICD-10-CM | POA: Diagnosis not present

## 2018-06-16 DIAGNOSIS — E1122 Type 2 diabetes mellitus with diabetic chronic kidney disease: Secondary | ICD-10-CM | POA: Diagnosis present

## 2018-06-16 DIAGNOSIS — E1165 Type 2 diabetes mellitus with hyperglycemia: Secondary | ICD-10-CM | POA: Diagnosis present

## 2018-06-16 DIAGNOSIS — Z833 Family history of diabetes mellitus: Secondary | ICD-10-CM

## 2018-06-16 DIAGNOSIS — Z4781 Encounter for orthopedic aftercare following surgical amputation: Secondary | ICD-10-CM | POA: Diagnosis not present

## 2018-06-16 DIAGNOSIS — D631 Anemia in chronic kidney disease: Secondary | ICD-10-CM | POA: Diagnosis not present

## 2018-06-16 DIAGNOSIS — F418 Other specified anxiety disorders: Secondary | ICD-10-CM | POA: Diagnosis not present

## 2018-06-16 DIAGNOSIS — Z89511 Acquired absence of right leg below knee: Secondary | ICD-10-CM | POA: Diagnosis not present

## 2018-06-16 DIAGNOSIS — E875 Hyperkalemia: Secondary | ICD-10-CM | POA: Diagnosis present

## 2018-06-16 DIAGNOSIS — F17210 Nicotine dependence, cigarettes, uncomplicated: Secondary | ICD-10-CM | POA: Diagnosis present

## 2018-06-16 DIAGNOSIS — D72829 Elevated white blood cell count, unspecified: Secondary | ICD-10-CM

## 2018-06-16 DIAGNOSIS — D649 Anemia, unspecified: Secondary | ICD-10-CM | POA: Diagnosis not present

## 2018-06-16 DIAGNOSIS — G8918 Other acute postprocedural pain: Secondary | ICD-10-CM | POA: Diagnosis not present

## 2018-06-16 DIAGNOSIS — Z794 Long term (current) use of insulin: Secondary | ICD-10-CM

## 2018-06-16 DIAGNOSIS — E162 Hypoglycemia, unspecified: Secondary | ICD-10-CM | POA: Diagnosis not present

## 2018-06-16 DIAGNOSIS — Z89432 Acquired absence of left foot: Secondary | ICD-10-CM | POA: Diagnosis not present

## 2018-06-16 DIAGNOSIS — Z720 Tobacco use: Secondary | ICD-10-CM | POA: Diagnosis not present

## 2018-06-16 DIAGNOSIS — A419 Sepsis, unspecified organism: Secondary | ICD-10-CM

## 2018-06-16 DIAGNOSIS — M1990 Unspecified osteoarthritis, unspecified site: Secondary | ICD-10-CM | POA: Diagnosis present

## 2018-06-16 DIAGNOSIS — M7989 Other specified soft tissue disorders: Secondary | ICD-10-CM | POA: Diagnosis present

## 2018-06-16 DIAGNOSIS — E8809 Other disorders of plasma-protein metabolism, not elsewhere classified: Secondary | ICD-10-CM | POA: Diagnosis not present

## 2018-06-16 DIAGNOSIS — R7309 Other abnormal glucose: Secondary | ICD-10-CM | POA: Diagnosis not present

## 2018-06-16 DIAGNOSIS — L02611 Cutaneous abscess of right foot: Secondary | ICD-10-CM | POA: Diagnosis present

## 2018-06-16 DIAGNOSIS — E46 Unspecified protein-calorie malnutrition: Secondary | ICD-10-CM | POA: Diagnosis not present

## 2018-06-16 DIAGNOSIS — R700 Elevated erythrocyte sedimentation rate: Secondary | ICD-10-CM | POA: Diagnosis present

## 2018-06-16 DIAGNOSIS — Z96642 Presence of left artificial hip joint: Secondary | ICD-10-CM | POA: Diagnosis present

## 2018-06-16 DIAGNOSIS — R7982 Elevated C-reactive protein (CRP): Secondary | ICD-10-CM | POA: Diagnosis present

## 2018-06-16 DIAGNOSIS — S88111D Complete traumatic amputation at level between knee and ankle, right lower leg, subsequent encounter: Secondary | ICD-10-CM | POA: Diagnosis not present

## 2018-06-16 DIAGNOSIS — Z79899 Other long term (current) drug therapy: Secondary | ICD-10-CM

## 2018-06-16 DIAGNOSIS — E119 Type 2 diabetes mellitus without complications: Secondary | ICD-10-CM

## 2018-06-16 DIAGNOSIS — F419 Anxiety disorder, unspecified: Secondary | ICD-10-CM | POA: Diagnosis not present

## 2018-06-16 DIAGNOSIS — L97509 Non-pressure chronic ulcer of other part of unspecified foot with unspecified severity: Secondary | ICD-10-CM | POA: Diagnosis not present

## 2018-06-16 LAB — URINALYSIS, ROUTINE W REFLEX MICROSCOPIC
Bilirubin Urine: NEGATIVE
Glucose, UA: NEGATIVE mg/dL
KETONES UR: NEGATIVE mg/dL
Leukocytes, UA: NEGATIVE
NITRITE: NEGATIVE
PH: 5 (ref 5.0–8.0)
Protein, ur: >=300 mg/dL — AB
Specific Gravity, Urine: 1.022 (ref 1.005–1.030)

## 2018-06-16 LAB — COMPREHENSIVE METABOLIC PANEL
ALBUMIN: 2.4 g/dL — AB (ref 3.5–5.0)
ALK PHOS: 68 U/L (ref 38–126)
ALT: 10 U/L (ref 0–44)
ANION GAP: 10 (ref 5–15)
AST: 11 U/L — ABNORMAL LOW (ref 15–41)
BUN: 20 mg/dL (ref 6–20)
CALCIUM: 8.5 mg/dL — AB (ref 8.9–10.3)
CO2: 19 mmol/L — ABNORMAL LOW (ref 22–32)
Chloride: 103 mmol/L (ref 98–111)
Creatinine, Ser: 1.78 mg/dL — ABNORMAL HIGH (ref 0.61–1.24)
GFR calc Af Amer: 50 mL/min — ABNORMAL LOW (ref >60)
GFR calc non Af Amer: 43 mL/min — ABNORMAL LOW (ref >60)
GLUCOSE: 172 mg/dL — AB (ref 70–99)
POTASSIUM: 4.5 mmol/L (ref 3.5–5.1)
SODIUM: 132 mmol/L — AB (ref 135–145)
Total Bilirubin: 0.6 mg/dL (ref 0.3–1.2)
Total Protein: 6.4 g/dL — ABNORMAL LOW (ref 6.5–8.1)

## 2018-06-16 LAB — CBC WITH DIFFERENTIAL/PLATELET
Abs Immature Granulocytes: 0.11 10*3/uL — ABNORMAL HIGH (ref 0.00–0.07)
BASOS ABS: 0.1 10*3/uL (ref 0.0–0.1)
BASOS PCT: 0.5 %
Basophils Absolute: 68 cells/uL (ref 0–200)
Basophils Relative: 0 %
EOS PCT: 1.3 %
Eosinophils Absolute: 176 cells/uL (ref 15–500)
Eosinophils Absolute: 0.2 10*3/uL (ref 0.0–0.5)
Eosinophils Relative: 1 %
HCT: 35.8 % — ABNORMAL LOW (ref 38.5–50.0)
HCT: 33.8 % — ABNORMAL LOW (ref 39.0–52.0)
HEMOGLOBIN: 11.7 g/dL — AB (ref 13.2–17.1)
Hemoglobin: 10.4 g/dL — ABNORMAL LOW (ref 13.0–17.0)
Immature Granulocytes: 1 %
LYMPHS ABS: 1.2 10*3/uL (ref 0.7–4.0)
LYMPHS PCT: 8 %
Lymphs Abs: 972 cells/uL (ref 850–3900)
MCH: 26.8 pg — ABNORMAL LOW (ref 27.0–33.0)
MCH: 26.2 pg (ref 26.0–34.0)
MCHC: 32.7 g/dL (ref 32.0–36.0)
MCHC: 30.8 g/dL (ref 30.0–36.0)
MCV: 81.9 fL (ref 80.0–100.0)
MCV: 85.1 fL (ref 80.0–100.0)
MONOS PCT: 7.3 %
MONOS PCT: 9 %
MPV: 10.6 fL (ref 7.5–12.5)
Monocytes Absolute: 1.3 10*3/uL — ABNORMAL HIGH (ref 0.1–1.0)
NEUTROS ABS: 11300 {cells}/uL — AB (ref 1500–7800)
NEUTROS ABS: 12.2 10*3/uL — AB (ref 1.7–7.7)
NEUTROS PCT: 81 %
Neutrophils Relative %: 83.7 %
PLATELETS: 423 10*3/uL — AB (ref 150–400)
Platelets: 431 10*3/uL — ABNORMAL HIGH (ref 140–400)
RBC: 4.37 10*6/uL (ref 4.20–5.80)
RBC: 3.97 MIL/uL — AB (ref 4.22–5.81)
RDW: 12.4 % (ref 11.0–15.0)
RDW: 12.5 % (ref 11.5–15.5)
Total Lymphocyte: 7.2 %
WBC mixed population: 986 cells/uL — ABNORMAL HIGH (ref 200–950)
WBC: 13.5 10*3/uL — AB (ref 3.8–10.8)
WBC: 15 10*3/uL — ABNORMAL HIGH (ref 4.0–10.5)
nRBC: 0 % (ref 0.0–0.2)

## 2018-06-16 LAB — BASIC METABOLIC PANEL
BUN / CREAT RATIO: 12 (calc) (ref 6–22)
BUN: 21 mg/dL (ref 7–25)
CHLORIDE: 99 mmol/L (ref 98–110)
CO2: 22 mmol/L (ref 20–32)
Calcium: 8.6 mg/dL (ref 8.6–10.3)
Creat: 1.7 mg/dL — ABNORMAL HIGH (ref 0.60–1.35)
GLUCOSE: 156 mg/dL — AB (ref 65–99)
Potassium: 4.8 mmol/L (ref 3.5–5.3)
Sodium: 132 mmol/L — ABNORMAL LOW (ref 135–146)

## 2018-06-16 LAB — I-STAT CG4 LACTIC ACID, ED
Lactic Acid, Venous: 1.07 mmol/L (ref 0.5–1.9)
Lactic Acid, Venous: 1.08 mmol/L (ref 0.5–1.9)

## 2018-06-16 LAB — SEDIMENTATION RATE: SED RATE: 128 mm/h — AB (ref 0–15)

## 2018-06-16 LAB — GLUCOSE, CAPILLARY: GLUCOSE-CAPILLARY: 161 mg/dL — AB (ref 70–99)

## 2018-06-16 LAB — C-REACTIVE PROTEIN: CRP: 215.2 mg/L — ABNORMAL HIGH (ref <8.0)

## 2018-06-16 MED ORDER — VANCOMYCIN HCL 10 G IV SOLR
2000.0000 mg | Freq: Once | INTRAVENOUS | Status: AC
  Administered 2018-06-16: 2000 mg via INTRAVENOUS
  Filled 2018-06-16: qty 2000

## 2018-06-16 MED ORDER — ONDANSETRON HCL 4 MG/2ML IJ SOLN
4.0000 mg | Freq: Four times a day (QID) | INTRAMUSCULAR | Status: DC | PRN
  Administered 2018-06-20: 4 mg via INTRAVENOUS
  Filled 2018-06-16: qty 2

## 2018-06-16 MED ORDER — TRAMADOL HCL 50 MG PO TABS
50.0000 mg | ORAL_TABLET | Freq: Four times a day (QID) | ORAL | Status: DC | PRN
  Administered 2018-06-17 – 2018-06-18 (×3): 50 mg via ORAL
  Filled 2018-06-16 (×3): qty 1

## 2018-06-16 MED ORDER — SODIUM CHLORIDE 0.9 % IV SOLN
2.0000 g | Freq: Two times a day (BID) | INTRAVENOUS | Status: DC
  Administered 2018-06-17: 2 g via INTRAVENOUS
  Administered 2018-06-17: 21:00:00 via INTRAVENOUS
  Administered 2018-06-18: 2 g via INTRAVENOUS
  Filled 2018-06-16 (×3): qty 2

## 2018-06-16 MED ORDER — INSULIN ASPART PROT & ASPART (70-30 MIX) 100 UNIT/ML ~~LOC~~ SUSP
15.0000 [IU] | Freq: Two times a day (BID) | SUBCUTANEOUS | Status: DC
  Administered 2018-06-17 – 2018-06-20 (×5): 15 [IU] via SUBCUTANEOUS
  Filled 2018-06-16 (×3): qty 10

## 2018-06-16 MED ORDER — ONDANSETRON HCL 4 MG PO TABS: 4.0000 mg | ORAL_TABLET | Freq: Four times a day (QID) | ORAL | Status: DC | PRN

## 2018-06-16 MED ORDER — INSULIN ASPART 100 UNIT/ML ~~LOC~~ SOLN
0.0000 [IU] | Freq: Three times a day (TID) | SUBCUTANEOUS | Status: DC
  Administered 2018-06-17: 2 [IU] via SUBCUTANEOUS
  Administered 2018-06-17: 3 [IU] via SUBCUTANEOUS
  Administered 2018-06-17 – 2018-06-18 (×3): 1 [IU] via SUBCUTANEOUS
  Administered 2018-06-19: 2 [IU] via SUBCUTANEOUS
  Administered 2018-06-19 – 2018-06-20 (×2): 3 [IU] via SUBCUTANEOUS
  Administered 2018-06-20: 7 [IU] via SUBCUTANEOUS
  Administered 2018-06-20 – 2018-06-21 (×2): 2 [IU] via SUBCUTANEOUS
  Administered 2018-06-22: 1 [IU] via SUBCUTANEOUS
  Administered 2018-06-22: 2 [IU] via SUBCUTANEOUS
  Administered 2018-06-22: 1 [IU] via SUBCUTANEOUS

## 2018-06-16 MED ORDER — ACETAMINOPHEN 325 MG PO TABS
650.0000 mg | ORAL_TABLET | Freq: Four times a day (QID) | ORAL | Status: DC | PRN
  Administered 2018-06-17: 650 mg via ORAL
  Filled 2018-06-16: qty 2

## 2018-06-16 MED ORDER — SODIUM CHLORIDE 0.9 % IV SOLN
2000.0000 mg | Freq: Once | INTRAVENOUS | Status: AC
  Administered 2018-06-16: 2000 mg via INTRAVENOUS
  Filled 2018-06-16: qty 2

## 2018-06-16 MED ORDER — SODIUM CHLORIDE 0.9 % IV SOLN
INTRAVENOUS | Status: AC
  Administered 2018-06-16: 21:00:00 via INTRAVENOUS

## 2018-06-16 MED ORDER — ACETAMINOPHEN 650 MG RE SUPP: 650.0000 mg | Freq: Four times a day (QID) | RECTAL | Status: DC | PRN

## 2018-06-16 NOTE — ED Notes (Signed)
Attempted report 

## 2018-06-16 NOTE — Progress Notes (Signed)
Pharmacy Antibiotic Note  Edward [name] is a 47 y.o. male admitted on 06/16/2018 with concern for 4th metatarsal osteomyelitis.  Pharmacy has been consulted for cefepime dosing. He had a right partial 5th ray resection on 9/28; plan is for 4th ray amputation tomorrow. Cefepime 2 g IV given at 19:49; vancomycin 2 g ordered but not given yet.  Plan: Cefepime 2 g IV q12h Monitor renal function, clinical progress, C/S  Weight: 217 lb 13 oz (98.8 kg)  Temp (24hrs), Avg:98.4 F (36.9 C), Min:98.4 F (36.9 C), Max:98.4 F (36.9 C)  Recent Labs  Lab 06/15/18 1458 06/16/18 1116 06/16/18 1141 06/16/18 1939  WBC 13.5* 15.0*  --   --   CREATININE 1.70* 1.78*  --   --   LATICACIDVEN  --   --  1.07 1.08    Estimated Creatinine Clearance: 62.8 mL/min (A) (by C-G formula based on SCr of 1.78 mg/dL (H)).    No Known Allergies  Antimicrobials this admission: cefepime 10/29 >>  vancomycin 10/29 x1  Dose adjustments this admission:   Microbiology results: 10/29 BCx: pending 10/21 right foot wound: Enterococcus faecalis    Thank you for allowing pharmacy to be a part of this patient's care.  Renold Genta, PharmD, BCPS Clinical Pharmacist Clinical phone for 06/16/2018 until 10p is [phone] Please check AMION for all Pharmacist numbers by unit 06/16/2018 8:40 PM

## 2018-06-16 NOTE — ED Triage Notes (Signed)
Pt reports recent surgery to right foot one month ago, is diabetic and amputation of little toe. Pt went to appt yesterday and had blood work done, sent here today for admission and iv antibiotics due to abnormal labs and elevated wbc.

## 2018-06-16 NOTE — H&P (Signed)
History and Physical    Edward [name] HWE:[id number] DOB: Mar 26, 1971 DOA: 06/16/2018  PCP: [name], MD  Patient coming from: Home.  Chief Complaint: Right foot infection.  HPI: Edward [name] is a 47 y.o. male with history of diabetes mellitus type 2, chronic anemia, who was recently admitted for osteomyelitis of the right foot and had undergone fifth ray amputation has been following up with podiatrist and started noticing ulceration around the amputated area week after the surgery.  Gradually started having discharge and podiatry started on x-rays as outpatient which showed signs concerning for osteomyelitis and patient was placed on antibiotics.  Due to worsening patient came to the ER.  Recent blood work show bandemia.  ED Course: In the ER x-rays revealed osteomyelitis of the fourth ray of the right foot and also third ray.  Patient was started on empiric antibiotics after blood cultures were obtained.  During recent admission patient also had Streptococcus bacteremia.  Patient states he has been compliant with his insulin.  Review of Systems: As per HPI, rest all negative.   Past Medical History:  Diagnosis Date  . Anxiety    self reported  . Arthritis   . Depression    self reported  . Diabetes mellitus   . Hypertension     Past Surgical History:  Procedure Laterality Date  . AMPUTATION Left 05/10/2016   Procedure: Left Transmetatarsal Amputation;  Surgeon: [name], MD;  Location: [facility];  Service: Orthopedics;  Laterality: Left;  . AMPUTATION Right 05/16/2018   Procedure: PARTIAL RAY AMPUTATION FIFTH TOE RIGHT FOOT;  Surgeon: [name], DPM;  Location: [place];  Service: Podiatry;  Laterality: Right;  . CIRCUMCISION  09/02/2011   Procedure: CIRCUMCISION ADULT;  Surgeon: Molli Hazard, MD;  Location: [facility];  Service: Urology;  Laterality: N/A;  . I&D EXTREMITY  09/02/2011   Procedure: IRRIGATION AND DEBRIDEMENT EXTREMITY;  Surgeon:  Theodoro Kos, DO;  Location: [facility];  Service: Plastics;  Laterality: N/A;  . INCISION AND DRAINAGE OF WOUND  08/12/2011   Procedure: IRRIGATION AND DEBRIDEMENT WOUND;  Surgeon: Zenovia Jarred, MD;  Location: [place];  Service: General;;  perineum  . IRRIGATION AND DEBRIDEMENT ABSCESS  08/12/2011   Procedure: IRRIGATION AND DEBRIDEMENT ABSCESS;  Surgeon: Molli Hazard, MD;  Location: [place];  Service: Urology;  Laterality: N/A;  irrigation and debridment perineum    . IRRIGATION AND DEBRIDEMENT ABSCESS  08/14/2011   Procedure: MINOR INCISION AND DRAINAGE OF ABSCESS;  Surgeon: Molli Hazard, MD;  Location: [facility];  Service: Urology;  Laterality: N/A;  Perineal Wound Debridement;Placement Bilateral Testicular Thigh Pouches  . TOTAL HIP ARTHROPLASTY Left 10/11/2016   Procedure: LEFT TOTAL HIP ARTHROPLASTY ANTERIOR APPROACH;  Surgeon: Mcarthur Rossetti, MD;  Location: [facility];  Service: Orthopedics;  Laterality: Left;     reports that he has been smoking cigarettes. He has never used smokeless tobacco. He reports that he drinks alcohol. He reports that he does not use drugs.  No Known Allergies  Family History  Problem Relation Age of Onset  . Diabetes Other     Prior to Admission medications   Medication Sig Start Date End Date Taking? Authorizing Provider  insulin aspart protamine - aspart (NOVOLOG 70/30 MIX) (70-30) 100 UNIT/ML FlexPen Inject 0.15 mLs (15 Units total) into the skin 2 (two) times daily. 05/19/18  Yes Elgergawy, Silver Huguenin, MD  lisinopril (PRINIVIL,ZESTRIL) 2.5 MG tablet Take 1 tablet (  2.5 mg total) by mouth daily. 05/20/18  Yes Elgergawy, Silver Huguenin, MD  sitaGLIPtin-metformin (JANUMET) 50-1000 MG tablet Take 1 tablet by mouth 2 (two) times daily with a meal. 05/19/18  Yes Elgergawy, Silver Huguenin, MD  sulfamethoxazole-trimethoprim (BACTRIM DS,SEPTRA DS) 800-160 MG tablet Take 1 tablet by mouth 2 (two) times daily. 06/08/18  Yes [name], DPM    traMADol (ULTRAM) 50 MG tablet Take 1 tablet (50 mg total) by mouth every 8 (eight) hours as needed. 05/25/18  Yes [name], DPM  ampicillin (PRINCIPEN) 500 MG capsule Take 1 capsule (500 mg total) by mouth 4 (four) times daily. Take 1-2 hours before foot on an empty stomach 06/15/18   Trula Slade, DPM  blood glucose meter kit and supplies Dispense based on patient and insurance preference. Use up to four times daily as directed. (FOR ICD-9 250.00, 250.01). 05/13/16   Barton Dubois, MD  Insulin Pen Needle 31G X 5 MM MISC Use daily to inject insulin as instructed 05/19/18   Elgergawy, Silver Huguenin, MD  lactobacillus acidophilus & bulgar (LACTINEX) chewable tablet Chew 1 tablet by mouth 3 (three) times daily with meals. 05/19/18   Elgergawy, Silver Huguenin, MD  magic mouthwash w/lidocaine SOLN Take 5 mLs by mouth 3 (three) times daily as needed for mouth pain. Patient not taking: Reported on 21/22/4825 0/03/70   Tori Milks, NP    Physical Exam: Vitals:   06/16/18 1425 06/16/18 1713 06/16/18 1900 06/16/18 1915  BP: 134/84 129/88  (!) 154/94  Pulse: 100 (!) 104  (!) 103  Resp: 18 18  18   Temp:      TempSrc:      SpO2: 99% 97%  98%  Weight:   98.8 kg       Constitutional: Moderately built and nourished. Vitals:   06/16/18 1425 06/16/18 1713 06/16/18 1900 06/16/18 1915  BP: 134/84 129/88  (!) 154/94  Pulse: 100 (!) 104  (!) 103  Resp: 18 18  18   Temp:      TempSrc:      SpO2: 99% 97%  98%  Weight:   98.8 kg    Eyes: Anicteric no pallor. ENMT: No discharge from the ears eyes nose or mouth. Neck: No mass or.  No neck rigidity but no JVD appreciated. Respiratory: No rhonchi or crepitations. Cardiovascular: S1-S2 heard no murmurs appreciated. Abdomen: Soft nontender bowel sounds present. Musculoskeletal: Right foot looks discolored with poor pulses and there is an ulceration on the amputated area. Skin: Ulcer on the amputated area of the right foot.  No active discharge.   Skin looks discolored. Neurologic: Alert awake oriented to time place and person. Psychiatric: Appears normal.   Labs on Admission: I have personally reviewed following labs and imaging studies  CBC: Recent Labs  Lab 06/15/18 1458 06/16/18 1116  WBC 13.5* 15.0*  NEUTROABS 11,300* 12.2*  HGB 11.7* 10.4*  HCT 35.8* 33.8*  MCV 81.9 85.1  PLT 431* 488*   Basic Metabolic Panel: Recent Labs  Lab 06/15/18 1458 06/16/18 1116  NA 132* 132*  K 4.8 4.5  CL 99 103  CO2 22 19*  GLUCOSE 156* 172*  BUN 21 20  CREATININE 1.70* 1.78*  CALCIUM 8.6 8.5*   GFR: Estimated Creatinine Clearance: 62.8 mL/min (A) (by C-G formula based on SCr of 1.78 mg/dL (H)). Liver Function Tests: Recent Labs  Lab 06/16/18 1116  AST 11*  ALT 10  ALKPHOS 68  BILITOT 0.6  PROT 6.4*  ALBUMIN 2.4*  No results for input(s): LIPASE, AMYLASE in the last 168 hours. No results for input(s): AMMONIA in the last 168 hours. Coagulation Profile: No results for input(s): INR, PROTIME in the last 168 hours. Cardiac Enzymes: No results for input(s): CKTOTAL, CKMB, CKMBINDEX, TROPONINI in the last 168 hours. BNP (last 3 results) No results for input(s): PROBNP in the last 8760 hours. HbA1C: No results for input(s): HGBA1C in the last 72 hours. CBG: No results for input(s): GLUCAP in the last 168 hours. Lipid Profile: No results for input(s): CHOL, HDL, LDLCALC, TRIG, CHOLHDL, LDLDIRECT in the last 72 hours. Thyroid Function Tests: No results for input(s): TSH, T4TOTAL, FREET4, T3FREE, THYROIDAB in the last 72 hours. Anemia Panel: No results for input(s): VITAMINB12, FOLATE, FERRITIN, TIBC, IRON, RETICCTPCT in the last 72 hours. Urine analysis:    Component Value Date/Time   COLORURINE AMBER (A) 06/16/2018 1820   APPEARANCEUR HAZY (A) 06/16/2018 1820   LABSPEC 1.022 06/16/2018 1820   PHURINE 5.0 06/16/2018 1820   GLUCOSEU NEGATIVE 06/16/2018 1820   HGBUR SMALL (A) 06/16/2018 1820   BILIRUBINUR  NEGATIVE 06/16/2018 1820   KETONESUR NEGATIVE 06/16/2018 1820   PROTEINUR >=300 (A) 06/16/2018 1820   UROBILINOGEN 0.2 05/04/2014 1002   NITRITE NEGATIVE 06/16/2018 1820   LEUKOCYTESUR NEGATIVE 06/16/2018 1820   Sepsis Labs: @LABRCNTIP (procalcitonin:4,lacticidven:4) ) Recent Results (from the past 240 hour(s))  WOUND CULTURE     Status: Abnormal   Collection Time: 06/08/18  4:45 PM  Result Value Ref Range Status   MICRO NUMBER: [id number]  Final   SPECIMEN QUALITY: ADEQUATE  Final   SOURCE: R FOOT  Final   STATUS: FINAL  Final   GRAM STAIN: No organisms or white blood cells seen  Final   ISOLATE 1: Enterococcus faecalis (A)  Final    Comment: Light growth of Enterococcus faecalis      Susceptibility   Enterococcus faecalis - AEROBIC CULT, GRAM STAIN POSITIVE 1    AMPICILLIN <=2 Sensitive     VANCOMYCIN* 1 Sensitive      * Legend:S = Susceptible  I = IntermediateR = Resistant  NS = Not susceptible* = Not tested  NR = Not reported**NN = See antimicrobic comments     Radiological Exams on Admission: Dg Foot 2 Views Right  Result Date: 06/16/2018 CLINICAL DATA:  Wound infection. Diabetes. EXAM: RIGHT FOOT - 2 VIEW COMPARISON:  06/08/2018 FINDINGS: Sequelae of fifth ray amputation are again identified at the level of the proximal fifth metatarsal. Skin staples have been removed. There is diffuse soft tissue swelling throughout the forefoot with soft tissue emphysema most notable adjacent to the first and fourth MTP joints, new from prior. There is progressive erosion of the head and distal shaft of the fourth metatarsal. There may also be erosion of the base of the fifth toe proximal phalanx and of the head of the third metatarsal. No acute fracture or dislocation is identified. IMPRESSION: Forefoot soft tissue swelling with soft tissue emphysema and progressive erosion of the fourth metatarsal head consistent with soft tissue infection and osteomyelitis. Possible osteomyelitis of the third  metatarsal head and base of the fourth proximal phalanx. Electronically Signed   By: Logan Bores M.D.   On: 06/16/2018 20:01     Assessment/Plan Principal Problem:   Osteomyelitis of right foot ([place]) Active Problems:   ARF (acute renal failure) ([facility])   DM2 (diabetes mellitus, type 2) ([place])   Osteomyelitis ([place])    1. Osteomyelitis of the right foot third and fourth  ray -patient placed on empiric antibiotics.  Will keep patient n.p.o. past midnight in anticipation of possible surgery.  Please consult patient's podiatrist in the morning.  Check ABI. 2. Acute renal failure -cause not clear.  Will continue with gentle hydration hold lisinopril.  Follow metabolic panel.   3. Diabetes mellitus type 2 on insulin recent hemoglobin A1c was around 15 patient states he has been compliant with his NovoLog 70/30.  Closely follow CBGs. 4. Chronic anemia follow CBC.   DVT prophylaxis: SCDs in anticipation of surgery. Code Status: Full code. Family Communication: Discussed with patient. Disposition Plan: Home. Consults called: None. Admission status: Inpatient.   Rise Patience MD [facility] Pager [phone].  If 7PM-7AM, please contact night-coverage [email or web address] Password [email or web address]  06/16/2018, 8:26 PM

## 2018-06-16 NOTE — ED Notes (Signed)
I informed pt of UA need.

## 2018-06-16 NOTE — Progress Notes (Signed)
Subjective: 47 year old male presents the office today for follow-up evaluation after undergoing a right partial fifth ray resection with Dr. Amalia Hailey.  He was last seen last week and he was started on Bactrim as there is concern for infection.  He presents today for follow-up evaluation.  He does not think the foot is been working well but overall he feels good and denies any fevers, chills, nausea, vomiting.  He denies any calf pain, chest pain, shortness of breath.  Objective: AAO x3, NAD DP/PT pulses palpable bilaterally, CRT less than 3 seconds Wound along the lateral aspect of foot and the wound is probed to the fourth metatarsal.  Small amount of purulence identified but this appears to be under an area of a superficial blister.  There is no probing proximally.  There is no fluctuation or crepitation.  Mild swelling to the foot there is no significant erythema or warmth.  No ascending cellulitis. Slight malodor No edema, erythema, increase in warmth to bilateral lower extremities.  No open lesions or pre-ulcerative lesions.  No pain with calf compression, swelling, warmth, erythema  Assessment: Concern for osteomyelitis fourth metatarsal p  Pan: -All treatment options discussed with the patient including all alternatives, risks, complications.  -I reviewed the x-rays from last appointment with Dr. Amalia Hailey with him.  Concern for osteomyelitis to the fourth metatarsal.  He was asked by the wound care consult and I will do this for him.  Will check blood work including ESR, CRP, WBC. Santyl dressing changes daily for now.  -Discussed partial 4th ray resection. Will await the blood work results and check clinically -Patient encouraged to call the office with any questions, concerns, change in symptoms.    Trula Slade DPM   *Recevied the blood work and ESR and CRP very elevated. WBC is elevated. Given appearance of the wound and lab work recommend admission for IV and likely surgery  Wednesday  Trula Slade DPM

## 2018-06-16 NOTE — ED Provider Notes (Signed)
I saw and evaluated the patient, reviewed the resident's note and I agree with the findings and plan.  Pertinent History: Diabetic male, prior partial right foot amputation of the small toe and part of the right lateral foot.  Presents with increasing pain swelling, purulent drainage and foul smell to the foot.    Pertinent Exam findings: The patient has a very foul-smelling open wound to the right lateral foot.  There is swelling of that leg all the way to the knee with warmth and tenderness, there is no pitting edema above the ankle.  Compared to the left side there is asymmetry.  Likely needs further imaging to rule out worsening osteomyelitis or deep tissue infections, IV anabiotic's and admission.  Patient was told he needed to come for admission and I agree with this disposition.  He does have a leukocytosis and a new onset acute kidney injury which is much worse than before.  Creatinine today is close to 1.8, baseline of 1.2, leukocytosis of 15,000, lactic acid is normal.  With his tachycardia of 105 and a leukocytosis he meets criteria for sepsis with a source of infection being his foot.  Blood cultures will be obtained, antibiotics ordered, the patient will be admitted to the hospital.  He is not in septic shock based on his lactic acid and his blood pressure which was 129/88.  I was personally present and directly supervised the following procedures:  Medical resuscitation  .Critical Care Performed by: Noemi Chapel, MD Authorized by: Noemi Chapel, MD   Critical care provider statement:    Critical care time (minutes):  35   Critical care time was exclusive of:  Separately billable procedures and treating other patients and teaching time   Critical care was necessary to treat or prevent imminent or life-threatening deterioration of the following conditions:  Sepsis   Critical care was time spent personally by me on the following activities:  Blood draw for specimens, development of  treatment plan with patient or surrogate, discussions with consultants, evaluation of patient's response to treatment, examination of patient, obtaining history from patient or surrogate, ordering and performing treatments and interventions, ordering and review of laboratory studies, ordering and review of radiographic studies, pulse oximetry, re-evaluation of patient's condition and review of old charts    I personally interpreted the EKG as well as the resident and agree with the interpretation on the resident's chart.  Final diagnoses:  Sepsis with acute renal failure without septic shock, due to unspecified organism, unspecified acute renal failure type ([place])  AKI (acute kidney injury) ([place])      Noemi Chapel, MD 06/18/18 1600

## 2018-06-16 NOTE — Telephone Encounter (Signed)
Spoke with patient in regards to Dr. Marilu Favre orders, patient is to go to [facility], ED to be admitted.  Lab results were abnormal, WBC at 13.5 CRP at 215.2 sed rate at 128.  Patient states that he does not have a ride at this time, but when his son gets off work that he will be able to take him around 4 PM.  Per Dr. Jacqualyn Posey patient will need IV antibiotics, and consult for foot surgery.

## 2018-06-16 NOTE — Plan of Care (Addendum)
I called Edward [name] to discuss blood work. Edward [name] is still waiting to be seen. Edward [name] WBC is higher today than yesterday. I discussed with Edward [name] at minimum 4th ray amputation and Edward [name] is in agreement to this. I have Edward [name] scheduled for surgery Wednesday at 9:30 for right foot partial 4th ray amputation pending work-up and clearance by hospitalist after Edward [name] has been evaluated. Edward [name] understands that Edward [name] is at risk for further amputation and Edward [name] may need to go back after this for further debridement pending infection.   I will plan on seeing Edward [name] in the morning.   Colvin Caroli, [place] O: [phone] C: [phone]

## 2018-06-16 NOTE — ED Provider Notes (Signed)
[facility] EMERGENCY DEPARTMENT Provider Note   CSN: [id number] Arrival date & time: 06/16/18  1023     History   Chief Complaint Chief Complaint  Patient presents with  . Post-op Problem    HPI Edward [name] is a 47 y.o. male.  HPI  Patient is a 47 year old male with a past medical history of diabetes and hypertension who presents to the emergency department for evaluation due to concern for infection of his recently operated on right foot.  Patient recently underwent a partial right foot amputation of the small toe and part of the right lateral foot.  Patient was then discharged on antibiotics.  Unfortunately, patient reports that he has had some increasing swelling to his right foot as well as increasing purulent drainage.  As a result a CBC was obtained in the outpatient setting which did show an elevated white blood cell count.  As result patient was sent to the emergency department for further evaluation.  Patient reports mild achy pain in his right foot.  It does not radiate.  Worsened by any pressure over the area.  No pain he also reports purulent drainage and swelling.  Denies any fevers or chills.  Denies any other symptoms at this time.  Remaining review of systems is as below.   Past Medical History:  Diagnosis Date  . Anxiety    self reported  . Arthritis   . Depression    self reported  . Diabetes mellitus   . Hypertension     Patient Active Problem List   Diagnosis Date Noted  . AKI (acute kidney injury) ([place]) 06/17/2018  . Normocytic anemia 06/17/2018  . Osteomyelitis of right foot ([place]) 05/14/2018  . DM2 (diabetes mellitus, type 2) ([place]) 05/14/2018  . Viral pharyngitis 01/14/2017  . Cigarette smoker 01/14/2017  . Elevated blood pressure reading 01/14/2017  . Avascular necrosis of hip, left ([place]) 10/11/2016  . Status post left hip replacement 10/11/2016  . Unilateral primary osteoarthritis, left hip 09/18/2016  . S/P transmetatarsal  amputation of foot, left ([place]) 07/25/2016  . Balance problem 07/25/2016  . Sepsis ([place]) 05/07/2016  . Fournier's gangrene 08/16/2011  . Acute respiratory failure ([place]) 08/16/2011  . Septic shock(785.52) 08/16/2011  . Diabetic osteomyelitis ([place]) 08/16/2011    Past Surgical History:  Procedure Laterality Date  . AMPUTATION Left 05/10/2016   Procedure: Left Transmetatarsal Amputation;  Surgeon: [name], MD;  Location: [facility];  Service: Orthopedics;  Laterality: Left;  . AMPUTATION Right 05/16/2018   Procedure: PARTIAL RAY AMPUTATION FIFTH TOE RIGHT FOOT;  Surgeon: [name], DPM;  Location: [facility];  Service: Podiatry;  Laterality: Right;  . CIRCUMCISION  09/02/2011   Procedure: CIRCUMCISION ADULT;  Surgeon: Molli Hazard, MD;  Location: [place];  Service: Urology;  Laterality: N/A;  . I&D EXTREMITY  09/02/2011   Procedure: IRRIGATION AND DEBRIDEMENT EXTREMITY;  Surgeon: Theodoro Kos, DO;  Location: [facility];  Service: Plastics;  Laterality: N/A;  . INCISION AND DRAINAGE OF WOUND  08/12/2011   Procedure: IRRIGATION AND DEBRIDEMENT WOUND;  Surgeon: Zenovia Jarred, MD;  Location: [facility];  Service: General;;  perineum  . IRRIGATION AND DEBRIDEMENT ABSCESS  08/12/2011   Procedure: IRRIGATION AND DEBRIDEMENT ABSCESS;  Surgeon: Molli Hazard, MD;  Location: [facility];  Service: Urology;  Laterality: N/A;  irrigation and debridment perineum    . IRRIGATION AND DEBRIDEMENT ABSCESS  08/14/2011   Procedure: MINOR INCISION AND DRAINAGE OF  ABSCESS;  Surgeon: Molli Hazard, MD;  Location: [place];  Service: Urology;  Laterality: N/A;  Perineal Wound Debridement;Placement Bilateral Testicular Thigh Pouches  . TOTAL HIP ARTHROPLASTY Left 10/11/2016   Procedure: LEFT TOTAL HIP ARTHROPLASTY ANTERIOR APPROACH;  Surgeon: Mcarthur Rossetti, MD;  Location: [facility];  Service: Orthopedics;  Laterality: Left;        Home Medications    Prior to  Admission medications   Medication Sig Start Date End Date Taking? Authorizing Provider  insulin aspart protamine - aspart (NOVOLOG 70/30 MIX) (70-30) 100 UNIT/ML FlexPen Inject 0.15 mLs (15 Units total) into the skin 2 (two) times daily. 05/19/18  Yes Elgergawy, Silver Huguenin, MD  lisinopril (PRINIVIL,ZESTRIL) 2.5 MG tablet Take 1 tablet (2.5 mg total) by mouth daily. 05/20/18  Yes Elgergawy, Silver Huguenin, MD  sitaGLIPtin-metformin (JANUMET) 50-1000 MG tablet Take 1 tablet by mouth 2 (two) times daily with a meal. 05/19/18  Yes Elgergawy, Silver Huguenin, MD  sulfamethoxazole-trimethoprim (BACTRIM DS,SEPTRA DS) 800-160 MG tablet Take 1 tablet by mouth 2 (two) times daily. 06/08/18  Yes [name], DPM  traMADol (ULTRAM) 50 MG tablet Take 1 tablet (50 mg total) by mouth every 8 (eight) hours as needed. 05/25/18  Yes [name], DPM  ampicillin (PRINCIPEN) 500 MG capsule Take 1 capsule (500 mg total) by mouth 4 (four) times daily. Take 1-2 hours before foot on an empty stomach 06/15/18   Trula Slade, DPM  blood glucose meter kit and supplies Dispense based on patient and insurance preference. Use up to four times daily as directed. (FOR ICD-9 250.00, 250.01). 05/13/16   Barton Dubois, MD  Insulin Pen Needle 31G X 5 MM MISC Use daily to inject insulin as instructed 05/19/18   Elgergawy, Silver Huguenin, MD  lactobacillus acidophilus & bulgar (LACTINEX) chewable tablet Chew 1 tablet by mouth 3 (three) times daily with meals. 05/19/18   Elgergawy, Silver Huguenin, MD  magic mouthwash w/lidocaine SOLN Take 5 mLs by mouth 3 (three) times daily as needed for mouth pain. Patient not taking: Reported on 89/38/1017 12/27/23   DefeliceJeanett Schlein, NP    Family History Family History  Problem Relation Age of Onset  . Diabetes Other     Social History Social History   Tobacco Use  . Smoking status: Current Every Day Smoker    Types: Cigarettes  . Smokeless tobacco: Never Used  Substance Use Topics  . Alcohol use: Yes     Alcohol/week: 0.0 standard drinks    Comment: has not had alcohol in a month  . Drug use: No     Allergies   Patient has no known allergies.   Review of Systems Review of Systems  Constitutional: Negative for chills and fever.  HENT: Negative for ear pain and sore throat.   Eyes: Negative for pain and visual disturbance.  Respiratory: Negative for cough and shortness of breath.   Cardiovascular: Negative for chest pain and palpitations.  Gastrointestinal: Negative for abdominal pain and vomiting.  Genitourinary: Negative for difficulty urinating and flank pain.  Musculoskeletal: Positive for gait problem. Negative for back pain.       Mild pain in right foot.   Skin: Positive for wound (Lateral right foot. ). Negative for color change and rash.  Neurological: Negative for seizures and syncope.  All other systems reviewed and are negative.    Physical Exam Updated Vital Signs BP 123/76 (BP Location: Left Arm)   Pulse 98   Temp 98.9 F (37.2 C) (  Oral)   Resp 19   Ht 6' 0.99" (1.854 m)   Wt 98.8 kg   SpO2 96%   BMI 28.74 kg/m   Physical Exam  Constitutional: He appears well-developed and well-nourished.  HENT:  Head: Normocephalic and atraumatic.  Eyes: Conjunctivae are normal.  Neck: Neck supple.  Cardiovascular: Normal rate and regular rhythm.  Pulmonary/Chest: Effort normal. No respiratory distress.  Abdominal: Soft. There is no tenderness.  Musculoskeletal:  Patient with postoperative changes to his right foot.  Area of the operation continues to show significant erythema.  There is a significant amount of purulent, malodorous drainage present at the surgical site.  There is some associated edema in patient's remaining foot.  Neurological: He is alert.  Skin: Skin is warm and dry.  Psychiatric: He has a normal mood and affect.  Nursing note and vitals reviewed.    ED Treatments / Results  Labs (all labs ordered are listed, but only abnormal results are  displayed) Labs Reviewed  COMPREHENSIVE METABOLIC PANEL - Abnormal; Notable for the following components:      Result Value   Sodium 132 (*)    CO2 19 (*)    Glucose, Bld 172 (*)    Creatinine, Ser 1.78 (*)    Calcium 8.5 (*)    Total Protein 6.4 (*)    Albumin 2.4 (*)    AST 11 (*)    GFR calc non Af Amer 43 (*)    GFR calc Af Amer 50 (*)    All other components within normal limits  CBC WITH DIFFERENTIAL/PLATELET - Abnormal; Notable for the following components:   WBC 15.0 (*)    RBC 3.97 (*)    Hemoglobin 10.4 (*)    HCT 33.8 (*)    Platelets 423 (*)    Neutro Abs 12.2 (*)    Monocytes Absolute 1.3 (*)    Abs Immature Granulocytes 0.11 (*)    All other components within normal limits  URINALYSIS, ROUTINE W REFLEX MICROSCOPIC - Abnormal; Notable for the following components:   Color, Urine AMBER (*)    APPearance HAZY (*)    Hgb urine dipstick SMALL (*)    Protein, ur >=300 (*)    Bacteria, UA RARE (*)    All other components within normal limits  BASIC METABOLIC PANEL - Abnormal; Notable for the following components:   Sodium 133 (*)    CO2 20 (*)    Glucose, Bld 230 (*)    BUN 24 (*)    Creatinine, Ser 1.86 (*)    Calcium 8.6 (*)    GFR calc non Af Amer 41 (*)    GFR calc Af Amer 48 (*)    All other components within normal limits  CBC WITH DIFFERENTIAL/PLATELET - Abnormal; Notable for the following components:   WBC 15.2 (*)    RBC 4.17 (*)    Hemoglobin 10.6 (*)    HCT 34.9 (*)    MCH 25.4 (*)    Platelets 448 (*)    Neutro Abs 12.5 (*)    Monocytes Absolute 1.3 (*)    Abs Immature Granulocytes 0.09 (*)    All other components within normal limits  GLUCOSE, CAPILLARY - Abnormal; Notable for the following components:   Glucose-Capillary 161 (*)    All other components within normal limits  GLUCOSE, CAPILLARY - Abnormal; Notable for the following components:   Glucose-Capillary 218 (*)    All other components within normal limits  GLUCOSE, CAPILLARY -  Abnormal;  Notable for the following components:   Glucose-Capillary 216 (*)    All other components within normal limits  GLUCOSE, CAPILLARY - Abnormal; Notable for the following components:   Glucose-Capillary 190 (*)    All other components within normal limits  GLUCOSE, CAPILLARY - Abnormal; Notable for the following components:   Glucose-Capillary 142 (*)    All other components within normal limits  CULTURE, BLOOD (ROUTINE X 2)  CULTURE, BLOOD (ROUTINE X 2)  SURGICAL PCR SCREEN  AEROBIC/ANAEROBIC CULTURE (SURGICAL/DEEP WOUND)  BASIC METABOLIC PANEL  I-STAT CG4 LACTIC ACID, ED  I-STAT CG4 LACTIC ACID, ED    EKG EKG Interpretation  Date/Time:  Tuesday June 16 2018 19:24:14 EDT Ventricular Rate:  106 PR Interval:    QRS Duration: 95 QT Interval:  353 QTC Calculation: 469 R Axis:   38 Text Interpretation:  Sinus tachycardia Probable left ventricular hypertrophy ST elev, probable normal early repol pattern Baseline wander in lead(s) II III aVR aVF Since last tracing rate faster Confirmed by Noemi Chapel [phone]) on 06/16/2018 7:32:14 PM Also confirmed by Noemi Chapel [phone]), editor Hattie Perch [phone])  on 06/17/2018 12:13:03 PM   Radiology Dg Foot 2 Views Right  Result Date: 06/16/2018 CLINICAL DATA:  Wound infection. Diabetes. EXAM: RIGHT FOOT - 2 VIEW COMPARISON:  06/08/2018 FINDINGS: Sequelae of fifth ray amputation are again identified at the level of the proximal fifth metatarsal. Skin staples have been removed. There is diffuse soft tissue swelling throughout the forefoot with soft tissue emphysema most notable adjacent to the first and fourth MTP joints, new from prior. There is progressive erosion of the head and distal shaft of the fourth metatarsal. There may also be erosion of the base of the fifth toe proximal phalanx and of the head of the third metatarsal. No acute fracture or dislocation is identified. IMPRESSION: Forefoot soft tissue swelling with soft  tissue emphysema and progressive erosion of the fourth metatarsal head consistent with soft tissue infection and osteomyelitis. Possible osteomyelitis of the third metatarsal head and base of the fourth proximal phalanx. Electronically Signed   By: Logan Bores M.D.   On: 06/16/2018 20:01    Procedures Procedures (including critical care time)  Medications Ordered in ED Medications  insulin aspart protamine- aspart (NOVOLOG MIX 70/30) injection 15 Units (15 Units Subcutaneous Given 06/17/18 1752)  acetaminophen (TYLENOL) tablet 650 mg (650 mg Oral Given 06/17/18 1928)    Or  acetaminophen (TYLENOL) suppository 650 mg ( Rectal See Alternative 06/17/18 1928)  ondansetron (ZOFRAN) tablet 4 mg ( Oral MAR Unhold 06/17/18 1115)    Or  ondansetron (ZOFRAN) injection 4 mg ( Intravenous MAR Unhold 06/17/18 1115)  insulin aspart (novoLOG) injection 0-9 Units (1 Units Subcutaneous Given 06/17/18 1752)  0.9 %  sodium chloride infusion ( Intravenous Stopped 06/17/18 0822)  traMADol (ULTRAM) tablet 50 mg (50 mg Oral Given 06/17/18 1710)  ceFEPIme (MAXIPIME) 2 g in sodium chloride 0.9 % 100 mL IVPB ( Intravenous MAR Unhold 06/17/18 1115)  lactated ringers infusion ( Intravenous Anesthesia Volume Adjustment 06/17/18 1032)  polyethylene glycol (MIRALAX / GLYCOLAX) packet 17 g (17 g Oral Not Given 06/17/18 1338)  vancomycin (VANCOCIN) IVPB 1000 mg/200 mL premix (has no administration in time range)  vancomycin (VANCOCIN) 2,000 mg in sodium chloride 0.9 % 500 mL IVPB (2,000 mg Intravenous New Bag/Given 06/16/18 2104)  ceFEPIme (MAXIPIME) 2,000 mg in sodium chloride 0.9 % 100 mL IVPB (0 mg Intravenous Stopped 06/16/18 2057)  Chlorhexidine Gluconate Cloth 2 % PADS 6 each (  6 each Topical Given 06/17/18 0809)    And  Chlorhexidine Gluconate Cloth 2 % PADS 6 each (6 each Topical Given 06/17/18 0809)  magnesium citrate solution 1 Bottle (1 Bottle Oral Given 06/17/18 1245)  vancomycin (VANCOCIN) 2,000 mg in sodium  chloride 0.9 % 500 mL IVPB ( Intravenous Rate/Dose Verify 06/17/18 1624)     Initial Impression / Assessment and Plan / ED Course  I have reviewed the triage vital signs and the nursing notes.  Pertinent labs & imaging results that were available during my care of the patient were reviewed by me and considered in my medical decision making (see chart for details).  Clinical Course as of Jun 17 2048  Tue Jun 16, 2018  1900 Has AKI as well as Leukocytosis - compared with yesterday - seems worsening   [BM]    Clinical Course User Index [BM] Noemi Chapel, MD    Patient is a 47 year old male with past medical history as detailed above who presents to the emergency department for evaluation of possible infection to his right foot.  Patient recently underwent surgery on his right foot.  He endorses he has noticed increased swelling and increasing purulent drainage over the last few days despite being on antibiotics as an outpatient.  Patient was seen at outside clinic with a CBC was obtained which did show leukocytosis, and as result patient sent to the emergency department for evaluation of his wound.  Upon arrival patient is hemodynamically stable and in no acute distress.  Physical exam is as detailed above.  Labs and imaging studies were obtained and results are as above, but briefly patient does have leukocytosis and x-ray findings concerning for osteomyelitis.  As a result patient was started on IV antibiotics with vancomycin and cefepime.  The hospital service was consulted for admission, and the patient was accepted to their service for further evaluation and care.  In addition to patient's infection, he also has an acute kidney injury given the concern that he may have acute renal failure in the setting of his wound infection.  For further information regarding patient's continued hospital course please see admitting team's documentation.  The care of this patient was discussed with my  attending physician Dr. Sabra Heck, who voices agreement with work-up and ED disposition.  Final Clinical Impressions(s) / ED Diagnoses   Final diagnoses:  Sepsis with acute renal failure without septic shock, due to unspecified organism, unspecified acute renal failure type [facility])  AKI (acute kidney injury) [facility])    ED Discharge Orders    None       Charise Leinbach, Chanda Busing, MD 06/17/18 2053    Noemi Chapel, MD 06/18/18 1600

## 2018-06-16 NOTE — Telephone Encounter (Signed)
Earlier today I was sent to the ER where I'm currently at. However, someone from there called me and I was calling to see who that was. My number is [phone]. Thank you.

## 2018-06-17 ENCOUNTER — Encounter (HOSPITAL_COMMUNITY)
Admission: EM | Disposition: A | Discharge status: Rehabilitation Facility | Payer: Self-pay | Source: Home / Self Care | Attending: Family Medicine

## 2018-06-17 ENCOUNTER — Inpatient Hospital Stay (HOSPITAL_COMMUNITY): Payer: Medicaid Other | Admitting: Anesthesiology

## 2018-06-17 ENCOUNTER — Encounter (HOSPITAL_COMMUNITY): Payer: Medicaid Other

## 2018-06-17 ENCOUNTER — Inpatient Hospital Stay (HOSPITAL_COMMUNITY): Admission: RE | Admit: 2018-06-17 | Payer: Medicaid Other | Source: Ambulatory Visit | Admitting: Podiatry

## 2018-06-17 ENCOUNTER — Encounter (HOSPITAL_COMMUNITY): Payer: Self-pay | Admitting: Anesthesiology

## 2018-06-17 DIAGNOSIS — E11621 Type 2 diabetes mellitus with foot ulcer: Secondary | ICD-10-CM

## 2018-06-17 DIAGNOSIS — L97509 Non-pressure chronic ulcer of other part of unspecified foot with unspecified severity: Secondary | ICD-10-CM

## 2018-06-17 DIAGNOSIS — D649 Anemia, unspecified: Secondary | ICD-10-CM

## 2018-06-17 DIAGNOSIS — M869 Osteomyelitis, unspecified: Secondary | ICD-10-CM

## 2018-06-17 DIAGNOSIS — N179 Acute kidney failure, unspecified: Secondary | ICD-10-CM

## 2018-06-17 DIAGNOSIS — M86671 Other chronic osteomyelitis, right ankle and foot: Secondary | ICD-10-CM

## 2018-06-17 HISTORY — PX: AMPUTATION: SHX166

## 2018-06-17 LAB — CBC WITH DIFFERENTIAL/PLATELET
ABS IMMATURE GRANULOCYTES: 0.09 10*3/uL — AB (ref 0.00–0.07)
BASOS ABS: 0 10*3/uL (ref 0.0–0.1)
BASOS PCT: 0 %
Eosinophils Absolute: 0.2 10*3/uL (ref 0.0–0.5)
Eosinophils Relative: 2 %
HCT: 34.9 % — ABNORMAL LOW (ref 39.0–52.0)
HEMOGLOBIN: 10.6 g/dL — AB (ref 13.0–17.0)
Immature Granulocytes: 1 %
LYMPHS PCT: 7 %
Lymphs Abs: 1.1 10*3/uL (ref 0.7–4.0)
MCH: 25.4 pg — AB (ref 26.0–34.0)
MCHC: 30.4 g/dL (ref 30.0–36.0)
MCV: 83.7 fL (ref 80.0–100.0)
Monocytes Absolute: 1.3 10*3/uL — ABNORMAL HIGH (ref 0.1–1.0)
Monocytes Relative: 8 %
NEUTROS ABS: 12.5 10*3/uL — AB (ref 1.7–7.7)
NRBC: 0 % (ref 0.0–0.2)
Neutrophils Relative %: 82 %
PLATELETS: 448 10*3/uL — AB (ref 150–400)
RBC: 4.17 MIL/uL — AB (ref 4.22–5.81)
RDW: 12.6 % (ref 11.5–15.5)
WBC: 15.2 10*3/uL — AB (ref 4.0–10.5)

## 2018-06-17 LAB — BASIC METABOLIC PANEL
ANION GAP: 10 (ref 5–15)
BUN: 24 mg/dL — ABNORMAL HIGH (ref 6–20)
CHLORIDE: 103 mmol/L (ref 98–111)
CO2: 20 mmol/L — ABNORMAL LOW (ref 22–32)
Calcium: 8.6 mg/dL — ABNORMAL LOW (ref 8.9–10.3)
Creatinine, Ser: 1.86 mg/dL — ABNORMAL HIGH (ref 0.61–1.24)
GFR, EST AFRICAN AMERICAN: 48 mL/min — AB (ref >60)
GFR, EST NON AFRICAN AMERICAN: 41 mL/min — AB (ref >60)
Glucose, Bld: 230 mg/dL — ABNORMAL HIGH (ref 70–99)
Potassium: 4.3 mmol/L (ref 3.5–5.1)
SODIUM: 133 mmol/L — AB (ref 135–145)

## 2018-06-17 LAB — GLUCOSE, CAPILLARY
GLUCOSE-CAPILLARY: 218 mg/dL — AB (ref 70–99)
GLUCOSE-CAPILLARY: 190 mg/dL — AB (ref 70–99)
Glucose-Capillary: 216 mg/dL — ABNORMAL HIGH (ref 70–99)
Glucose-Capillary: 142 mg/dL — ABNORMAL HIGH (ref 70–99)
Glucose-Capillary: 120 mg/dL — ABNORMAL HIGH (ref 70–99)

## 2018-06-17 LAB — SURGICAL PCR SCREEN
MRSA, PCR: NEGATIVE
Staphylococcus aureus: NEGATIVE

## 2018-06-17 SURGERY — AMPUTATION, FOOT, RAY
Anesthesia: General | Site: Foot | Laterality: Right

## 2018-06-17 MED ORDER — ONDANSETRON HCL 4 MG/2ML IJ SOLN
INTRAMUSCULAR | Status: DC | PRN
  Administered 2018-06-17: 4 mg via INTRAVENOUS

## 2018-06-17 MED ORDER — PROPOFOL 10 MG/ML IV BOLUS
INTRAVENOUS | Status: DC | PRN
  Administered 2018-06-17: 50 mg via INTRAVENOUS
  Administered 2018-06-17: 100 mg via INTRAVENOUS
  Administered 2018-06-17: 150 mg via INTRAVENOUS

## 2018-06-17 MED ORDER — SUCCINYLCHOLINE CHLORIDE 20 MG/ML IJ SOLN
INTRAMUSCULAR | Status: DC | PRN
  Administered 2018-06-17: 120 mg via INTRAVENOUS

## 2018-06-17 MED ORDER — VANCOMYCIN HCL 10 G IV SOLR
2000.0000 mg | Freq: Once | INTRAVENOUS | Status: AC
  Administered 2018-06-17: 2000 mg via INTRAVENOUS
  Filled 2018-06-17: qty 2000

## 2018-06-17 MED ORDER — POLYETHYLENE GLYCOL 3350 17 G PO PACK
17.0000 g | PACK | Freq: Two times a day (BID) | ORAL | Status: DC
  Administered 2018-06-17 – 2018-06-18 (×2): 17 g via ORAL
  Filled 2018-06-17 (×2): qty 1

## 2018-06-17 MED ORDER — FENTANYL CITRATE (PF) 250 MCG/5ML IJ SOLN
INTRAMUSCULAR | Status: AC
  Filled 2018-06-17: qty 5

## 2018-06-17 MED ORDER — SODIUM CHLORIDE 0.9 % IV SOLN
INTRAVENOUS | Status: DC | PRN
  Administered 2018-06-17: 100 ug/min via INTRAVENOUS

## 2018-06-17 MED ORDER — LIDOCAINE HCL 1 % IJ SOLN
INTRAMUSCULAR | Status: DC | PRN
  Administered 2018-06-17: 30 mL

## 2018-06-17 MED ORDER — LACTATED RINGERS IV SOLN
INTRAVENOUS | Status: DC
  Administered 2018-06-17 (×2): via INTRAVENOUS

## 2018-06-17 MED ORDER — CHLORHEXIDINE GLUCONATE CLOTH 2 % EX PADS
6.0000 | MEDICATED_PAD | Freq: Once | CUTANEOUS | Status: AC
  Administered 2018-06-17: 6 via TOPICAL

## 2018-06-17 MED ORDER — VANCOMYCIN HCL IN DEXTROSE 1-5 GM/200ML-% IV SOLN
1000.0000 mg | Freq: Three times a day (TID) | INTRAVENOUS | Status: DC
  Administered 2018-06-17 – 2018-06-20 (×7): 1000 mg via INTRAVENOUS
  Filled 2018-06-17 (×9): qty 200

## 2018-06-17 MED ORDER — OXYCODONE HCL 5 MG/5ML PO SOLN: 5.0000 mg | Freq: Once | ORAL | Status: DC | PRN

## 2018-06-17 MED ORDER — FENTANYL CITRATE (PF) 100 MCG/2ML IJ SOLN: 25.0000 ug | INTRAMUSCULAR | Status: DC | PRN

## 2018-06-17 MED ORDER — MIDAZOLAM HCL 5 MG/5ML IJ SOLN
INTRAMUSCULAR | Status: DC | PRN
  Administered 2018-06-17: 2 mg via INTRAVENOUS

## 2018-06-17 MED ORDER — OXYCODONE HCL 5 MG PO TABS: 5.0000 mg | ORAL_TABLET | Freq: Once | ORAL | Status: DC | PRN

## 2018-06-17 MED ORDER — SUCCINYLCHOLINE CHLORIDE 200 MG/10ML IV SOSY
PREFILLED_SYRINGE | INTRAVENOUS | Status: AC
  Filled 2018-06-17: qty 10

## 2018-06-17 MED ORDER — MIDAZOLAM HCL 2 MG/2ML IJ SOLN
INTRAMUSCULAR | Status: AC
  Filled 2018-06-17: qty 2

## 2018-06-17 MED ORDER — PROMETHAZINE HCL 25 MG/ML IJ SOLN: 6.2500 mg | INTRAMUSCULAR | Status: DC | PRN

## 2018-06-17 MED ORDER — 0.9 % SODIUM CHLORIDE (POUR BTL) OPTIME
TOPICAL | Status: DC | PRN
  Administered 2018-06-17: 1000 mL

## 2018-06-17 MED ORDER — FENTANYL CITRATE (PF) 100 MCG/2ML IJ SOLN
INTRAMUSCULAR | Status: DC | PRN
  Administered 2018-06-17 (×2): 50 ug via INTRAVENOUS
  Administered 2018-06-17: 100 ug via INTRAVENOUS
  Administered 2018-06-17 (×3): 50 ug via INTRAVENOUS

## 2018-06-17 MED ORDER — CEFAZOLIN SODIUM 1 G IJ SOLR
INTRAMUSCULAR | Status: AC
  Filled 2018-06-17: qty 30

## 2018-06-17 MED ORDER — ONDANSETRON HCL 4 MG/2ML IJ SOLN
INTRAMUSCULAR | Status: AC
  Filled 2018-06-17: qty 2

## 2018-06-17 MED ORDER — MAGNESIUM CITRATE PO SOLN
1.0000 | Freq: Once | ORAL | Status: AC
  Administered 2018-06-17: 1 via ORAL
  Filled 2018-06-17: qty 296

## 2018-06-17 MED ORDER — LIDOCAINE 2% (20 MG/ML) 5 ML SYRINGE
INTRAMUSCULAR | Status: AC
  Filled 2018-06-17: qty 5

## 2018-06-17 MED ORDER — MEPERIDINE HCL 50 MG/ML IJ SOLN: 6.2500 mg | INTRAMUSCULAR | Status: DC | PRN

## 2018-06-17 MED ORDER — PHENYLEPHRINE 40 MCG/ML (10ML) SYRINGE FOR IV PUSH (FOR BLOOD PRESSURE SUPPORT)
PREFILLED_SYRINGE | INTRAVENOUS | Status: DC | PRN
  Administered 2018-06-17: 120 ug via INTRAVENOUS
  Administered 2018-06-17 (×2): 80 ug via INTRAVENOUS
  Administered 2018-06-17: 40 ug via INTRAVENOUS

## 2018-06-17 MED ORDER — EPHEDRINE SULFATE-NACL 50-0.9 MG/10ML-% IV SOSY
PREFILLED_SYRINGE | INTRAVENOUS | Status: DC | PRN
  Administered 2018-06-17: 5 mg via INTRAVENOUS

## 2018-06-17 MED ORDER — BUPIVACAINE HCL (PF) 0.5 % IJ SOLN
INTRAMUSCULAR | Status: DC | PRN
  Administered 2018-06-17: 30 mL

## 2018-06-17 MED ORDER — LIDOCAINE 2% (20 MG/ML) 5 ML SYRINGE
INTRAMUSCULAR | Status: DC | PRN
  Administered 2018-06-17: 60 mg via INTRAVENOUS

## 2018-06-17 SURGICAL SUPPLY — 53 items
BANDAGE ELASTIC 4 VELCRO ST LF (GAUZE/BANDAGES/DRESSINGS) ×1 IMPLANT
BANDAGE ESMARK 6X9 LF (GAUZE/BANDAGES/DRESSINGS) IMPLANT
BLADE AVERAGE 25X9 (BLADE) ×1 IMPLANT
BLADE SURG 10 STRL SS (BLADE) ×2 IMPLANT
BNDG CMPR 9X6 STRL LF SNTH (GAUZE/BANDAGES/DRESSINGS)
BNDG COHESIVE 4X5 TAN STRL (GAUZE/BANDAGES/DRESSINGS) ×1 IMPLANT
BNDG ESMARK 6X9 LF (GAUZE/BANDAGES/DRESSINGS)
BNDG GAUZE ELAST 4 BULKY (GAUZE/BANDAGES/DRESSINGS) ×1 IMPLANT
COVER WAND RF STERILE (DRAPES) ×2 IMPLANT
CUFF TOURNIQUET SINGLE 34IN LL (TOURNIQUET CUFF) IMPLANT
CUFF TOURNIQUET SINGLE 44IN (TOURNIQUET CUFF) IMPLANT
DRAPE U-SHAPE 47X51 STRL (DRAPES) ×4 IMPLANT
DURAPREP 26ML APPLICATOR (WOUND CARE) ×1 IMPLANT
ELECT REM PT RETURN 9FT ADLT (ELECTROSURGICAL) ×2
ELECTRODE REM PT RTRN 9FT ADLT (ELECTROSURGICAL) ×1 IMPLANT
GAUZE SPONGE 4X4 12PLY STRL (GAUZE/BANDAGES/DRESSINGS) ×2 IMPLANT
GAUZE XEROFORM 5X9 LF (GAUZE/BANDAGES/DRESSINGS) ×1 IMPLANT
GLOVE BIO SURGEON STRL SZ8 (GLOVE) ×2 IMPLANT
GLOVE BIOGEL PI IND STRL 6.5 (GLOVE) IMPLANT
GLOVE BIOGEL PI IND STRL 7.0 (GLOVE) IMPLANT
GLOVE BIOGEL PI IND STRL 8 (GLOVE) ×1 IMPLANT
GLOVE BIOGEL PI INDICATOR 6.5 (GLOVE) ×2
GLOVE BIOGEL PI INDICATOR 7.0 (GLOVE) ×1
GLOVE BIOGEL PI INDICATOR 8 (GLOVE) ×1
GLOVE ORTHO TXT STRL SZ7.5 (GLOVE) ×2 IMPLANT
GLOVE SURG SS PI 6.5 STRL IVOR (GLOVE) ×1 IMPLANT
GOWN STRL REUS W/ TWL LRG LVL3 (GOWN DISPOSABLE) ×1 IMPLANT
GOWN STRL REUS W/ TWL XL LVL3 (GOWN DISPOSABLE) ×4 IMPLANT
GOWN STRL REUS W/TWL LRG LVL3 (GOWN DISPOSABLE) ×2
GOWN STRL REUS W/TWL XL LVL3 (GOWN DISPOSABLE) ×2
KIT BASIN OR (CUSTOM PROCEDURE TRAY) ×2 IMPLANT
KIT TURNOVER KIT B (KITS) ×2 IMPLANT
NDL HYPO 25GX1X1/2 BEV (NEEDLE) IMPLANT
NEEDLE HYPO 25GX1X1/2 BEV (NEEDLE) ×2 IMPLANT
NS IRRIG 1000ML POUR BTL (IV SOLUTION) ×2 IMPLANT
PACK ORTHO EXTREMITY (CUSTOM PROCEDURE TRAY) ×2 IMPLANT
PAD ARMBOARD 7.5X6 YLW CONV (MISCELLANEOUS) ×4 IMPLANT
PAD CAST 4YDX4 CTTN HI CHSV (CAST SUPPLIES) ×1 IMPLANT
PADDING CAST COTTON 4X4 STRL (CAST SUPPLIES)
SPONGE LAP 18X18 X RAY DECT (DISPOSABLE) ×3 IMPLANT
STAPLER VISISTAT 35W (STAPLE) ×2 IMPLANT
STOCKINETTE IMPERVIOUS LG (DRAPES) IMPLANT
SUT ETHILON 2 0 PSLX (SUTURE) ×3 IMPLANT
SUT SILK 3 0 (SUTURE) ×2
SUT SILK 3-0 18XBRD TIE 12 (SUTURE) IMPLANT
SWAB COLLECTION DEVICE MRSA (MISCELLANEOUS) ×1 IMPLANT
SWAB CULTURE ESWAB REG 1ML (MISCELLANEOUS) ×1 IMPLANT
SYR CONTROL 10ML LL (SYRINGE) ×1 IMPLANT
TOWEL OR 17X24 6PK STRL BLUE (TOWEL DISPOSABLE) ×2 IMPLANT
TOWEL OR 17X26 10 PK STRL BLUE (TOWEL DISPOSABLE) ×2 IMPLANT
TUBE CONNECTING 12X1/4 (SUCTIONS) ×2 IMPLANT
WATER STERILE IRR 1000ML POUR (IV SOLUTION) ×2 IMPLANT
YANKAUER SUCT BULB TIP NO VENT (SUCTIONS) ×2 IMPLANT

## 2018-06-17 NOTE — Progress Notes (Signed)
Inpatient Diabetes Program Recommendations  AACE/ADA: New Consensus Statement on Inpatient Glycemic Control (2015)  Target Ranges:  Prepandial:   less than 140 mg/dL      Peak postprandial:   less than 180 mg/dL (1-2 hours)      Critically ill patients:  140 - 180 mg/dL   Lab Results  Component Value Date   GLUCAP 190 (H) 06/17/2018   HGBA1C 15.9 (H) 05/14/2018    Review of Glycemic Control Results for Edward [name], Edward [name] (MRN [id number]) as of 06/17/2018 14:52  Ref. Range 06/16/2018 21:15 06/17/2018 06:04 06/17/2018 08:15 06/17/2018 10:46  Glucose-Capillary Latest Ref Range: 70 - 99 mg/dL 161 (H) 218 (H) 216 (H) 190 (H)   Diabetes history: Type 2 DM Outpatient Diabetes medications: Novolin 70/30 15 units BID, Janumet 1 tab BID Current orders for Inpatient glycemic control: Novolog 70/30 15 units BID, Novolog 0-9 units TID  Inpatient Diabetes Program Recommendations:    Of note, patient missed Novolog 70/30 dose this AM due to procedure, anticipate PM blood sugar to be elevated. In preparation for BKA and anticipated poor oral intake, consider switching to basal/bolus.  Consider: - Lantus 12 units QHS - Novolog 0-5 units QHS  Spoke with patient this afternoon regarding outpatient diabetes management. Patient states, "I have been taking my 70/30 as prescribed since September when I learned how high my A1C was." Patient admits to not having a meter and has not been checking blood sugars.  Reviewed patient's last A1c of 15.9%. Explained what a A1c is and what it measures. Also reviewed goal A1c with patient, importance of good glucose control @ home, and blood sugar goals. Reviewed patho of DM, need for insulin, impact of glucose to infection, vascular changes that occur and long term comorbidites.  Patient will need a blood glucose  Meter at discharge (includes lancets and strips) ([id number]). Encouraged to begin checking 2-3 times per day. Patient willing and is interested in further  information about dietitian. Will place consult.  Patient has no further questions at this time regarding DM.   Thanks, Bronson Curb, MSN, RNC-OB Diabetes Coordinator [phone] (8a-5p)

## 2018-06-17 NOTE — Progress Notes (Signed)
Pharmacy Antibiotic Note  Edward [name] is a 47 y.o. male admitted on 06/16/2018 with concern for 4th metatarsal osteomyelitis.  Currently on IV cefepime. Previous foot wound grew out enterococcus and repeat wound cx from 10/30 is growing out GPCs. Pharmacy consulted to start vancomycin. WBC up to 15.2.   Of note, patient is s/p right 4th ray amputation.   Plan: Cefepime 2 g IV q12h Vancomycin 2 gm IV once, then start vancomycin 1 gm IV Q 8 hours  Monitor renal function, clinical progress, C/S  Height: 6' 0.99" (185.4 cm) Weight: 217 lb 13 oz (98.8 kg) IBW/kg (Calculated) : 79.88  Temp (24hrs), Avg:99 F (37.2 C), Min:97.8 F (36.6 C), Max:100.2 F (37.9 C)  Recent Labs  Lab 06/15/18 1458 06/16/18 1116 06/16/18 1141 06/16/18 1939 06/17/18 0325  WBC 13.5* 15.0*  --   --  15.2*  CREATININE 1.70* 1.78*  --   --  1.86*  LATICACIDVEN  --   --  1.07 1.08  --     Estimated Creatinine Clearance: 60.1 mL/min (A) (by C-G formula based on SCr of 1.86 mg/dL (H)).    No Known Allergies  Antimicrobials this admission: cefepime 10/29 >>  vancomycin 10/29 x1  Dose adjustments this admission:   Microbiology results: 10/30 R foot wound: GPCs  10/29 BCx: ngtd 10/21 right foot wound: Enterococcus faecalis    Thank you for allowing pharmacy to be a part of this patient's care.  Albertina Parr, PharmD., BCPS Clinical Pharmacist Clinical phone for 06/17/18 until 3:30pm: [phone] If after 3:30pm, please refer to [facility] for unit-specific pharmacist

## 2018-06-17 NOTE — Anesthesia Preprocedure Evaluation (Signed)
Anesthesia Evaluation  Patient identified by MRN, date of birth, ID band Patient awake    Reviewed: Allergy & Precautions, NPO status , Patient's Chart, lab work & pertinent test results  Airway Mallampati: II  TM Distance: >3 FB     Dental   Pulmonary Current Smoker,    breath sounds clear to auscultation       Cardiovascular hypertension,  Rhythm:Regular Rate:Normal     Neuro/Psych PSYCHIATRIC DISORDERS Anxiety Depression negative neurological ROS     GI/Hepatic negative GI ROS, Neg liver ROS,   Endo/Other  diabetes, Type 2, Insulin Dependent  Renal/GU Renal InsufficiencyRenal disease     Musculoskeletal  (+) Arthritis ,   Abdominal   Peds  Hematology  (+) anemia ,   Anesthesia Other Findings   Reproductive/Obstetrics                             Lab Results  Component Value Date   WBC 15.2 (H) 06/17/2018   HGB 10.6 (L) 06/17/2018   HCT 34.9 (L) 06/17/2018   MCV 83.7 06/17/2018   PLT 448 (H) 06/17/2018   Lab Results  Component Value Date   CREATININE 1.86 (H) 06/17/2018   BUN 24 (H) 06/17/2018   NA 133 (L) 06/17/2018   K 4.3 06/17/2018   CL 103 06/17/2018   CO2 20 (L) 06/17/2018    Anesthesia Physical  Anesthesia Plan  ASA: III  Anesthesia Plan: General   Post-op Pain Management:    Induction: Intravenous  PONV Risk Score and Plan: 2 and Ondansetron, Treatment may vary due to age or medical condition and Dexamethasone  Airway Management Planned: LMA  Additional Equipment: None  Intra-op Plan:   Post-operative Plan: Extubation in OR  Informed Consent: I have reviewed the patients History and Physical, chart, labs and discussed the procedure including the risks, benefits and alternatives for the proposed anesthesia with the patient or authorized representative who has indicated his/her understanding and acceptance.   Dental advisory given  Plan Discussed with:  CRNA  Anesthesia Plan Comments:         Anesthesia Quick Evaluation

## 2018-06-17 NOTE — Brief Op Note (Signed)
06/17/2018  10:34 AM  PATIENT:  Edward [name]  47 y.o. male  PRE-OPERATIVE DIAGNOSIS:  Osteomyletis 4th Ray Right Foot  POST-OPERATIVE DIAGNOSIS:  Osteomyletis 4th Ray Right Foot  PROCEDURE:  Procedure(s): AMPUTATION 4th RAY Right Foot (Right)  SURGEON:  Surgeon(s) and Role:    * Trula Slade, DPM - Primary  PHYSICIAN ASSISTANT:   ASSISTANTS: none   ANESTHESIA:   general  EBL:  25 mL   BLOOD ADMINISTERED:none  DRAINS: none   LOCAL MEDICATIONS USED:  OTHER 10 cc marcaine and lidocaine plain  SPECIMEN:  Source of Specimen:  wound culture   DISPOSITION OF SPECIMEN:  PATHOLOGY  COUNTS:  YES  TOURNIQUET:  * No tourniquets in log *  DICTATION: .Dragon Dictation  PLAN OF CARE: inpatient   PATIENT DISPOSITION:  PACU - hemodynamically stable.   Delay start of Pharmacological VTE agent (>24hrs) due to surgical blood loss or risk of bleeding: no

## 2018-06-17 NOTE — Progress Notes (Signed)
I reviewed the x-rays and blood work this morning. I called the patient to discuss. Given infection I have recommended a transmetatarsal amputation and not just the 4th ray. I discussed the surgery and he is in agreement. I will do a formal consult this morning but I did call him to discuss. Will plan for surgery this morning. He is NPO. He is aware that he is at risk of leg loss given infection.   Celesta Gentile, DPM O: [phone] C: [phone]

## 2018-06-17 NOTE — Anesthesia Procedure Notes (Signed)
Procedure Name: Intubation Date/Time: 06/17/2018 9:39 AM Performed by: Lieutenant Diego, CRNA Pre-anesthesia Checklist: Patient identified, Emergency Drugs available, Suction available and Patient being monitored Patient Re-evaluated:Patient Re-evaluated prior to induction Oxygen Delivery Method: Circle system utilized Preoxygenation: Pre-oxygenation with 100% oxygen Induction Type: IV induction Ventilation: Mask ventilation without difficulty Laryngoscope Size: Miller and 2 Grade View: Grade I Tube type: Oral Tube size: 7.5 mm Number of attempts: 1 Airway Equipment and Method: Stylet and Oral airway Placement Confirmation: ETT inserted through vocal cords under direct vision,  positive ETCO2 and breath sounds checked- equal and bilateral Secured at: 23 cm Tube secured with: Tape Dental Injury: Teeth and Oropharynx as per pre-operative assessment

## 2018-06-17 NOTE — Consult Note (Signed)
Reason for Consult: Osteomyelitis Referring Physician: Dr. Karlene Lineman is an 47 y.o. male.  HPI: 47 year old male was admitted to the hospital last night for right foot infection.  He previously underwent a partial fifth ray amputation with Dr. Amalia Hailey which has not healed. He had significantly elevated ESR and CRP and his white blood cell count was increased.  He also has significant malodor coming from the wound.  Initially we discussed the partial fourth ray amputation but given the infection plan is to change amiodarone and plan for transmetatarsal amputation which he is scheduled for this morning.  I have been in contact with him in regard to the surgery and he wishes to go him proceed today.  He understands that he is at high risk of further amputation or limb loss.  Past Medical History:  Diagnosis Date  . Anxiety    self reported  . Arthritis   . Depression    self reported  . Diabetes mellitus   . Hypertension     Past Surgical History:  Procedure Laterality Date  . AMPUTATION Left 05/10/2016   Procedure: Left Transmetatarsal Amputation;  Surgeon: [name], MD;  Location: [facility];  Service: Orthopedics;  Laterality: Left;  . AMPUTATION Right 05/16/2018   Procedure: PARTIAL RAY AMPUTATION FIFTH TOE RIGHT FOOT;  Surgeon: [name], DPM;  Location: [facility];  Service: Podiatry;  Laterality: Right;  . CIRCUMCISION  09/02/2011   Procedure: CIRCUMCISION ADULT;  Surgeon: Molli Hazard, MD;  Location: [facility];  Service: Urology;  Laterality: N/A;  . I&D EXTREMITY  09/02/2011   Procedure: IRRIGATION AND DEBRIDEMENT EXTREMITY;  Surgeon: Theodoro Kos, DO;  Location: [facility];  Service: Plastics;  Laterality: N/A;  . INCISION AND DRAINAGE OF WOUND  08/12/2011   Procedure: IRRIGATION AND DEBRIDEMENT WOUND;  Surgeon: Zenovia Jarred, MD;  Location: [facility];  Service: General;;  perineum  . IRRIGATION AND DEBRIDEMENT ABSCESS  08/12/2011    Procedure: IRRIGATION AND DEBRIDEMENT ABSCESS;  Surgeon: Molli Hazard, MD;  Location: [facility];  Service: Urology;  Laterality: N/A;  irrigation and debridment perineum    . IRRIGATION AND DEBRIDEMENT ABSCESS  08/14/2011   Procedure: MINOR INCISION AND DRAINAGE OF ABSCESS;  Surgeon: Molli Hazard, MD;  Location: [facility];  Service: Urology;  Laterality: N/A;  Perineal Wound Debridement;Placement Bilateral Testicular Thigh Pouches  . TOTAL HIP ARTHROPLASTY Left 10/11/2016   Procedure: LEFT TOTAL HIP ARTHROPLASTY ANTERIOR APPROACH;  Surgeon: Mcarthur Rossetti, MD;  Location: [facility];  Service: Orthopedics;  Laterality: Left;    Family History  Problem Relation Age of Onset  . Diabetes Other     Social History:  reports that he has been smoking cigarettes. He has never used smokeless tobacco. He reports that he drinks alcohol. He reports that he does not use drugs.  Allergies: No Known Allergies  Medications: I have reviewed the patient's current medications.  Results for orders placed or performed during the hospital encounter of 06/16/18 (from the past 48 hour(s))  Comprehensive metabolic panel     Status: Abnormal   Collection Time: 06/16/18 11:16 AM  Result Value Ref Range   Sodium 132 (L) 135 - 145 mmol/L   Potassium 4.5 3.5 - 5.1 mmol/L   Chloride 103 98 - 111 mmol/L   CO2 19 (L) 22 - 32 mmol/L   Glucose, Bld 172 (H) 70 - 99 mg/dL   BUN 20 6 - 20 mg/dL  Creatinine, Ser 1.78 (H) 0.61 - 1.24 mg/dL   Calcium 8.5 (L) 8.9 - 10.3 mg/dL   Total Protein 6.4 (L) 6.5 - 8.1 g/dL   Albumin 2.4 (L) 3.5 - 5.0 g/dL   AST 11 (L) 15 - 41 U/L   ALT 10 0 - 44 U/L   Alkaline Phosphatase 68 38 - 126 U/L   Total Bilirubin 0.6 0.3 - 1.2 mg/dL   GFR calc non Af Amer 43 (L) >60 mL/min   GFR calc Af Amer 50 (L) >60 mL/min    Comment: (NOTE) The eGFR has been calculated using the CKD EPI equation. This calculation has not been validated in all clinical situations. eGFR's  persistently <60 mL/min signify possible Chronic Kidney Disease.    Anion gap 10 5 - 15    Comment: Performed at [facility] [address] [place], [place] [address]  CBC with Differential     Status: Abnormal   Collection Time: 06/16/18 11:16 AM  Result Value Ref Range   WBC 15.0 (H) 4.0 - 10.5 K/uL   RBC 3.97 (L) 4.22 - 5.81 MIL/uL   Hemoglobin 10.4 (L) 13.0 - 17.0 g/dL   HCT 33.8 (L) 39.0 - 52.0 %   MCV 85.1 80.0 - 100.0 fL   MCH 26.2 26.0 - 34.0 pg   MCHC 30.8 30.0 - 36.0 g/dL   RDW 12.5 11.5 - 15.5 %   Platelets 423 (H) 150 - 400 K/uL   nRBC 0.0 0.0 - 0.2 %   Neutrophils Relative % 81 %   Neutro Abs 12.2 (H) 1.7 - 7.7 K/uL   Lymphocytes Relative 8 %   Lymphs Abs 1.2 0.7 - 4.0 K/uL   Monocytes Relative 9 %   Monocytes Absolute 1.3 (H) 0.1 - 1.0 K/uL   Eosinophils Relative 1 %   Eosinophils Absolute 0.2 0.0 - 0.5 K/uL   Basophils Relative 0 %   Basophils Absolute 0.1 0.0 - 0.1 K/uL   Immature Granulocytes 1 %   Abs Immature Granulocytes 0.11 (H) 0.00 - 0.07 K/uL    Comment: Performed at [facility] [address], [place], [place] [address]  I-Stat CG4 Lactic Acid, ED     Status: None   Collection Time: 06/16/18 11:41 AM  Result Value Ref Range   Lactic Acid, Venous 1.07 0.5 - 1.9 mmol/L  Urinalysis, Routine w reflex microscopic     Status: Abnormal   Collection Time: 06/16/18  6:20 PM  Result Value Ref Range   Color, Urine AMBER (A) YELLOW    Comment: BIOCHEMICALS MAY BE AFFECTED BY COLOR   APPearance HAZY (A) CLEAR   Specific Gravity, Urine 1.022 1.005 - 1.030   pH 5.0 5.0 - 8.0   Glucose, UA NEGATIVE NEGATIVE mg/dL   Hgb urine dipstick SMALL (A) NEGATIVE   Bilirubin Urine NEGATIVE NEGATIVE   Ketones, ur NEGATIVE NEGATIVE mg/dL   Protein, ur >=300 (A) NEGATIVE mg/dL   Nitrite NEGATIVE NEGATIVE   Leukocytes, UA NEGATIVE NEGATIVE   RBC / HPF 6-10 0 - 5 RBC/hpf   WBC, UA 0-5 0 - 5 WBC/hpf   Bacteria, UA RARE (A) NONE SEEN   Squamous Epithelial  / LPF 0-5 0 - 5   Mucus PRESENT    Hyaline Casts, UA PRESENT     Comment: Performed at [facility], [address], [place], [place] [address]  I-Stat CG4 Lactic Acid, ED     Status: None   Collection Time:  06/16/18  7:39 PM  Result Value Ref Range   Lactic Acid, Venous 1.08 0.5 - 1.9 mmol/L  Glucose, capillary     Status: Abnormal   Collection Time: 06/16/18  9:15 PM  Result Value Ref Range   Glucose-Capillary 161 (H) 70 - 99 mg/dL  Basic metabolic panel     Status: Abnormal   Collection Time: 06/17/18  3:25 AM  Result Value Ref Range   Sodium 133 (L) 135 - 145 mmol/L   Potassium 4.3 3.5 - 5.1 mmol/L   Chloride 103 98 - 111 mmol/L   CO2 20 (L) 22 - 32 mmol/L   Glucose, Bld 230 (H) 70 - 99 mg/dL   BUN 24 (H) 6 - 20 mg/dL   Creatinine, Ser 1.86 (H) 0.61 - 1.24 mg/dL   Calcium 8.6 (L) 8.9 - 10.3 mg/dL   GFR calc non Af Amer 41 (L) >60 mL/min   GFR calc Af Amer 48 (L) >60 mL/min    Comment: (NOTE) The eGFR has been calculated using the CKD EPI equation. This calculation has not been validated in all clinical situations. eGFR's persistently <60 mL/min signify possible Chronic Kidney Disease.    Anion gap 10 5 - 15    Comment: Performed at [facility] [address], [place], [place] [address]  CBC WITH DIFFERENTIAL     Status: Abnormal   Collection Time: 06/17/18  3:25 AM  Result Value Ref Range   WBC 15.2 (H) 4.0 - 10.5 K/uL   RBC 4.17 (L) 4.22 - 5.81 MIL/uL   Hemoglobin 10.6 (L) 13.0 - 17.0 g/dL   HCT 34.9 (L) 39.0 - 52.0 %   MCV 83.7 80.0 - 100.0 fL   MCH 25.4 (L) 26.0 - 34.0 pg   MCHC 30.4 30.0 - 36.0 g/dL   RDW 12.6 11.5 - 15.5 %   Platelets 448 (H) 150 - 400 K/uL   nRBC 0.0 0.0 - 0.2 %   Neutrophils Relative % 82 %   Neutro Abs 12.5 (H) 1.7 - 7.7 K/uL   Lymphocytes Relative 7 %   Lymphs Abs 1.1 0.7 - 4.0 K/uL   Monocytes Relative 8 %   Monocytes Absolute 1.3 (H) 0.1 - 1.0 K/uL   Eosinophils Relative 2 %   Eosinophils Absolute 0.2 0.0 - 0.5 K/uL    Basophils Relative 0 %   Basophils Absolute 0.0 0.0 - 0.1 K/uL   Immature Granulocytes 1 %   Abs Immature Granulocytes 0.09 (H) 0.00 - 0.07 K/uL    Comment: Performed at [facility], [address] [place], [place] [address]  Glucose, capillary     Status: Abnormal   Collection Time: 06/17/18  6:04 AM  Result Value Ref Range   Glucose-Capillary 218 (H) 70 - 99 mg/dL  Surgical pcr screen     Status: None   Collection Time: 06/17/18  7:59 AM  Result Value Ref Range   MRSA, PCR NEGATIVE NEGATIVE   Staphylococcus aureus NEGATIVE NEGATIVE    Comment: (NOTE) The Xpert SA Assay (FDA approved for NASAL specimens in patients 69 years of age and older), is one component of a comprehensive surveillance program. It is not intended to diagnose infection nor to guide or monitor treatment. Performed at [facility], [place] [address], [place], [place] [address]   Glucose, capillary     Status: Abnormal   Collection Time: 06/17/18  8:15 AM  Result Value Ref Range   Glucose-Capillary 216 (H) 70 -  99 mg/dL    Dg Foot 2 Views Right  Result Date: 06/16/2018 CLINICAL DATA:  Wound infection. Diabetes. EXAM: RIGHT FOOT - 2 VIEW COMPARISON:  06/08/2018 FINDINGS: Sequelae of fifth ray amputation are again identified at the level of the proximal fifth metatarsal. Skin staples have been removed. There is diffuse soft tissue swelling throughout the forefoot with soft tissue emphysema most notable adjacent to the first and fourth MTP joints, new from prior. There is progressive erosion of the head and distal shaft of the fourth metatarsal. There may also be erosion of the base of the fifth toe proximal phalanx and of the head of the third metatarsal. No acute fracture or dislocation is identified. IMPRESSION: Forefoot soft tissue swelling with soft tissue emphysema and progressive erosion of the fourth metatarsal head consistent with soft tissue infection and osteomyelitis. Possible osteomyelitis of the  third metatarsal head and base of the fourth proximal phalanx. Electronically Signed   By: Logan Bores M.D.   On: 06/16/2018 20:01    ROS Blood pressure 130/87, pulse 97, temperature 99.1 F (37.3 C), temperature source Oral, resp. rate 16, height 6' 0.99" (1.854 m), weight 98.8 kg, SpO2 99 %. Physical Exam General: AAO x3, NAD  Dermatological: Ulceration present to the right foot and there is exposed fourth metatarsal.  There is significant malodor coming from the wound.  Edema to the forefoot increase in warmth to the forefoot.  There is not appear to be any ascending cellulitis.  Vascular: Pulses decreased.  Neruologic: Sensation decreased.  Musculoskeletal: Mild discomfort in the forefoot.  Gait: Unassisted, Nonantalgic.   Assessment/Plan: Osteomyelitis, soft tissue emphysema right foot -Treatment options discussed including all alternatives, risks, and complications -I discussed with him the x-ray findings again as well as his lab work.  Given the infection I recommended a transmetatarsal amputation which she is scheduled for this morning.  We discussed the surgery as well as postoperative course and possible need for further debridement.  Given the infection likely going to leave the wound open.  We discussed alternatives, risks, complications regards to surgery and he wished to proceed.  At this point we need to go and proceed with surgery to control the infection. -Recommend ABIs as well as possible vascular consult. -Likely infectious disease consult as well. -He has been n.p.o.   -He has no family present today.  He has no further questions or concerns.  Celesta Gentile, DPM O: [phone] C: [phone]

## 2018-06-17 NOTE — Progress Notes (Signed)
  PROGRESS NOTE  Edward [name] QPY:[id number] DOB: 1971/06/24 DOA: 06/16/2018 PCP: [name], MD  Brief Narrative: 47 year old man PMH diabetes mellitus type 2 recently admitted for osteomyelitis right foot, status post fifth ray amputation, following with podiatrist as outpatient, readmitted for concerns of osteomyelitis right f foot.  Imaging revealed osteomyelitis of the fourth ray of the right foot and also the third ray.  Admitted for osteomyelitis of the right third and fourth ray right foot  Assessment/Plan Osteomyelitis right third, fourth ray right foot.  Status post amputation fourth ray right foot, surgery revealed wound tracking to calcaneus with minimal viable soft tissue on the plantar aspect of the foot. --Podiatry recommending BKA.  Orthopedics has been consulted. --Continue empiric antibiotics  Acute kidney injury vs CKD. Lisinopril on hold --IV fluids.  Check BMP in a.m.  Diabetes mellitus type 2, hemoglobin A1c 15.9 May 14, 2018. --CBG stable.  Continue insulin 70/30 twice daily, sliding scale insulin  Normocytic anemia, likely chronic disease. --Hemoglobin stable at 10.6.  Follow clinically.   Further management per orthopedics. Check BMP in AM.  DVT prophylaxis: SCDs Code Status: Full Family Communication: son at bedside Disposition Plan: pending   Murray Hodgkins, MD  [facility] Direct contact: [phone] --[facility] [facility]  --[email or web address]; password [email or web address]  7PM-7AM contact night coverage as above 06/17/2018, 2:01 PM  LOS: 1 day   Consultants:  Podiatry  Orthopedics   Procedures:  AMPUTATION 4th RAY Right Foot (Right)  Antimicrobials:  Cefepime 10/29 >>  Vancomycin 10/29 >>  Interval history/Subjective: Reports constipation.  Objective: Vitals:  Vitals:   06/17/18 1100 06/17/18 1125  BP: 127/82 123/82  Pulse: 99 96  Resp: 20 18  Temp: 97.8 F (36.6 C) 98.9 F (37.2 C)  SpO2: 95% 97%     Exam:  Constitutional:  . Appears calm, mildly uncomfortable Respiratory:  . CTA bilaterally, no w/r/r.  . Respiratory effort normal.  Cardiovascular:  . RRR, no m/r/g . No LLE extremity edema   Psychiatric:  . Mental status o Mood, affect appropriate  I have personally reviewed the following:   Data: . BMP reviewed, sodium 133, creatinine 1.86, BUN 24, anion gap within normal limits.  Hemoglobin stable 10.6.  WBC 15.2.  Scheduled Meds: . insulin aspart  0-9 Units Subcutaneous TID WC  . insulin aspart protamine- aspart  15 Units Subcutaneous BID WC  . polyethylene glycol  17 g Oral BID   Continuous Infusions: . sodium chloride 100 mL/hr at 06/16/18 2103  . ceFEPime (MAXIPIME) IV 2 g (06/17/18 3825)  . lactated ringers 10 mL/hr at 06/17/18 0840  . vancomycin      Principal Problem:   Osteomyelitis of right foot (HCC) Active Problems:   DM2 (diabetes mellitus, type 2) (HCC)   AKI (acute kidney injury) ([facility])   Normocytic anemia   LOS: 1 day

## 2018-06-17 NOTE — Transfer of Care (Signed)
Immediate Anesthesia Transfer of Care Note  Patient: Edward [name]  Procedure(s) Performed: AMPUTATION 4th RAY Right Foot (Right Foot)  Patient Location: PACU  Anesthesia Type:General  Level of Consciousness: awake and alert   Airway & Oxygen Therapy: Patient Spontanous Breathing and Patient connected to face mask oxygen  Post-op Assessment: Report given to RN and Post -op Vital signs reviewed and stable  Post vital signs: Reviewed and stable  Last Vitals:  Vitals Value Taken Time  BP 133/86 06/17/2018 10:45 AM  Temp    Pulse 97 06/17/2018 10:47 AM  Resp 22 06/17/2018 10:47 AM  SpO2 96 % 06/17/2018 10:47 AM  Vitals shown include unvalidated device data.  Last Pain:  Vitals:   06/17/18 0754  TempSrc: Oral  PainSc:          Complications: No apparent anesthesia complications

## 2018-06-17 NOTE — Op Note (Signed)
PATIENT:  Edward [name]  47 y.o. male  PRE-OPERATIVE DIAGNOSIS:  Osteomyletis 4th Ray Right Foot  POST-OPERATIVE DIAGNOSIS:  Osteomyletis 4th Ray Right Foot  PROCEDURE:  Procedure(s): AMPUTATION 4th RAY Right Foot (Right)  SURGEON:  Surgeon(s) and Role:    * Trula Slade, DPM - Primary  PHYSICIAN ASSISTANT:   ASSISTANTS: none   ANESTHESIA:   general  EBL:  25 mL   BLOOD ADMINISTERED:none  DRAINS: none   LOCAL MEDICATIONS USED:  OTHER 10 cc marcaine and lidocaine plain  SPECIMEN:  Source of Specimen:  wound culture   DISPOSITION OF SPECIMEN:  PATHOLOGY  COUNTS:  YES  TOURNIQUET:  * No tourniquets in log *  DICTATION: .Dragon Dictation  PLAN OF CARE: inpatient   PATIENT DISPOSITION:  PACU - hemodynamically stable.   Delay start of Pharmacological VTE agent (>24hrs) due to surgical blood loss or risk of bleeding: no   Indications for surgery:  47 year old male was admitted to the hospital last night for worsening infection in his right foot.  I sent to the emergency room given significantly elevated blood work.  His white blood cell count was increased upon admission compared to Tuesday as well.  Due to this I discussed with him surgical intervention including transmetatarsal amputation of the right foot.  Discussed with him possible further surgery  if needed.  We discussed alternatives risks and complications.  No promises or guarantees given.  Procedure in detail: The patient was both verbally and visually identified by myself, the nursing and anesthesia staff in the preoperative holding area.  He has been transferred to the operating room via stretcher and placed on the operating table in supine position.  The right lower extremity was scrubbed, prepped and draped in the normal sterile fashion.   A timeout was performed.  A fishmouth incision was made on the right foot. The incision was made with a #10 blade scalpel. Incision was made down to bone and all  bleeders were cauterized and tied off as necessary.  At this time a sagittal saw was utilized to cut the metatarsals.  The incision was then carried on the plantar aspect at this time was found to be significant purulence and malodor coming from the plantar aspect of the foot.  The forefoot was disarticulated and passed off the table.  At this time significant purulence and malodor identified to the plantar soft tissues and the wound culture was obtained.  I debrided all nonviable tissue and unfortunately the wound does track all the way down to the calcaneus.  There is very minimal viable soft tissue on the plantar aspect of the foot.  I did adequately try to decompress the area to control infection.  I called Dr. Sharol Given for an intraoperative consult as I do not feel that the foot is viable. He is in agreement to this.  The incision was irrigated and packed open with Betadine, saline mixture.  Prior to this hemostasis was achieved.  I will discuss this with the patient.   Celesta Gentile, DPM O: [phone] C: [phone]

## 2018-06-17 NOTE — Progress Notes (Signed)
I discussed with Edward [name] as well as his 2 sons today the intraoperative findings. I do not think the foot is salvage at this point given the amount of soft tissue infection/loss going into the calcaneous. I do recommend a BKA unfortunately. He and his sons are aware. Dr. Sharol Given came to see the patient intraoperatively.   Celesta Gentile, DPM

## 2018-06-18 ENCOUNTER — Encounter (HOSPITAL_COMMUNITY): Payer: Self-pay | Admitting: Podiatry

## 2018-06-18 ENCOUNTER — Ambulatory Visit (INDEPENDENT_AMBULATORY_CARE_PROVIDER_SITE_OTHER): Payer: Self-pay | Admitting: Physician Assistant

## 2018-06-18 DIAGNOSIS — E43 Unspecified severe protein-calorie malnutrition: Secondary | ICD-10-CM

## 2018-06-18 DIAGNOSIS — E1142 Type 2 diabetes mellitus with diabetic polyneuropathy: Secondary | ICD-10-CM

## 2018-06-18 DIAGNOSIS — M86271 Subacute osteomyelitis, right ankle and foot: Secondary | ICD-10-CM

## 2018-06-18 LAB — BASIC METABOLIC PANEL
ANION GAP: 5 (ref 5–15)
BUN: 20 mg/dL (ref 6–20)
CO2: 21 mmol/L — AB (ref 22–32)
CREATININE: 1.6 mg/dL — AB (ref 0.61–1.24)
Calcium: 7.8 mg/dL — ABNORMAL LOW (ref 8.9–10.3)
Chloride: 107 mmol/L (ref 98–111)
GFR, EST AFRICAN AMERICAN: 57 mL/min — AB (ref >60)
GFR, EST NON AFRICAN AMERICAN: 49 mL/min — AB (ref >60)
Glucose, Bld: 129 mg/dL — ABNORMAL HIGH (ref 70–99)
Potassium: 4.2 mmol/L (ref 3.5–5.1)
Sodium: 133 mmol/L — ABNORMAL LOW (ref 135–145)

## 2018-06-18 LAB — GLUCOSE, CAPILLARY
GLUCOSE-CAPILLARY: 76 mg/dL (ref 70–99)
GLUCOSE-CAPILLARY: 83 mg/dL (ref 70–99)
GLUCOSE-CAPILLARY: 126 mg/dL — AB (ref 70–99)
Glucose-Capillary: 137 mg/dL — ABNORMAL HIGH (ref 70–99)
Glucose-Capillary: 146 mg/dL — ABNORMAL HIGH (ref 70–99)

## 2018-06-18 MED ORDER — PRO-STAT SUGAR FREE PO LIQD
30.0000 mL | Freq: Two times a day (BID) | ORAL | Status: DC
  Administered 2018-06-18 – 2018-06-22 (×6): 30 mL via ORAL
  Filled 2018-06-18 (×6): qty 30

## 2018-06-18 MED ORDER — POLYETHYLENE GLYCOL 3350 17 G PO PACK
17.0000 g | PACK | ORAL | Status: AC
  Administered 2018-06-18 (×3): 17 g via ORAL
  Filled 2018-06-18 (×3): qty 1

## 2018-06-18 MED ORDER — SENNA 8.6 MG PO TABS
1.0000 | ORAL_TABLET | Freq: Every day | ORAL | Status: DC
  Administered 2018-06-18 – 2018-06-21 (×4): 8.6 mg via ORAL
  Filled 2018-06-18 (×4): qty 1

## 2018-06-18 MED ORDER — TRAMADOL HCL 50 MG PO TABS
100.0000 mg | ORAL_TABLET | Freq: Four times a day (QID) | ORAL | Status: DC | PRN
  Administered 2018-06-18 – 2018-06-19 (×3): 100 mg via ORAL
  Filled 2018-06-18 (×3): qty 2

## 2018-06-18 MED ORDER — SODIUM CHLORIDE 0.9 % IV SOLN
1.0000 g | Freq: Three times a day (TID) | INTRAVENOUS | Status: AC
  Administered 2018-06-18 – 2018-06-20 (×5): 1 g via INTRAVENOUS
  Filled 2018-06-18 (×6): qty 1

## 2018-06-18 MED ORDER — ADULT MULTIVITAMIN W/MINERALS CH
1.0000 | ORAL_TABLET | Freq: Every day | ORAL | Status: DC
  Administered 2018-06-18 – 2018-06-22 (×4): 1 via ORAL
  Filled 2018-06-18 (×4): qty 1

## 2018-06-18 MED ORDER — CHLORHEXIDINE GLUCONATE 4 % EX LIQD
60.0000 mL | Freq: Once | CUTANEOUS | Status: AC
  Administered 2018-06-18: 4 via TOPICAL

## 2018-06-18 NOTE — H&P (View-Only) (Signed)
ORTHOPAEDIC CONSULTATION  REQUESTING PHYSICIAN: Samuella Cota, MD  Chief Complaint: Abscess osteomyelitis right foot status post limb salvage intervention.  HPI: Edward [name] is a 47 y.o. male who presents with osteomyelitis and abscess right foot.  Patient is status post limb salvage intervention he initially underwent 1/5 ray amputation subsequently was reevaluated for fourth ray amputation and had extensive infection which required debridement to the point that patient did not have sufficient soft tissue for limb salvage intervention.  Patient is seen in consultation for transtibial amputation.  Patient has severe protein caloric malnutrition with albumin of 2.4 and uncontrolled type 2 diabetes with his most recent A1c 15.9.  Patient is status post a left foot transmetatarsal amputation.  Past Medical History:  Diagnosis Date  . Anxiety    self reported  . Arthritis   . Depression    self reported  . Diabetes mellitus   . Hypertension    Past Surgical History:  Procedure Laterality Date  . AMPUTATION Left 05/10/2016   Procedure: Left Transmetatarsal Amputation;  Surgeon: [name], MD;  Location: [place];  Service: Orthopedics;  Laterality: Left;  . AMPUTATION Right 05/16/2018   Procedure: PARTIAL RAY AMPUTATION FIFTH TOE RIGHT FOOT;  Surgeon: [name], DPM;  Location: [place];  Service: Podiatry;  Laterality: Right;  . AMPUTATION Right 06/17/2018   Procedure: AMPUTATION 4th RAY Right Foot;  Surgeon: Trula Slade, DPM;  Location: [place];  Service: Podiatry;  Laterality: Right;  . CIRCUMCISION  09/02/2011   Procedure: CIRCUMCISION ADULT;  Surgeon: Molli Hazard, MD;  Location: [place];  Service: Urology;  Laterality: N/A;  . I&D EXTREMITY  09/02/2011   Procedure: IRRIGATION AND DEBRIDEMENT EXTREMITY;  Surgeon: Theodoro Kos, DO;  Location: [place];  Service: Plastics;  Laterality: N/A;  . INCISION AND DRAINAGE OF WOUND   08/12/2011   Procedure: IRRIGATION AND DEBRIDEMENT WOUND;  Surgeon: Zenovia Jarred, MD;  Location: [place];  Service: General;;  perineum  . IRRIGATION AND DEBRIDEMENT ABSCESS  08/12/2011   Procedure: IRRIGATION AND DEBRIDEMENT ABSCESS;  Surgeon: Molli Hazard, MD;  Location: [place];  Service: Urology;  Laterality: N/A;  irrigation and debridment perineum    . IRRIGATION AND DEBRIDEMENT ABSCESS  08/14/2011   Procedure: MINOR INCISION AND DRAINAGE OF ABSCESS;  Surgeon: Molli Hazard, MD;  Location: [place];  Service: Urology;  Laterality: N/A;  Perineal Wound Debridement;Placement Bilateral Testicular Thigh Pouches  . TOTAL HIP ARTHROPLASTY Left 10/11/2016   Procedure: LEFT TOTAL HIP ARTHROPLASTY ANTERIOR APPROACH;  Surgeon: Mcarthur Rossetti, MD;  Location: [facility];  Service: Orthopedics;  Laterality: Left;   Social History   Socioeconomic History  . Marital status: Widowed    Spouse name: Not on file  . Number of children: Not on file  . Years of education: Not on file  . Highest education level: Not on file  Occupational History  . Not on file  Social Needs  . Financial resource strain: Not on file  . Food insecurity:    Worry: Not on file    Inability: Not on file  . Transportation needs:    Medical: Not on file    Non-medical: Not on file  Tobacco Use  . Smoking status: Current Every Day Smoker    Types: Cigarettes  . Smokeless tobacco: Never Used  Substance and Sexual Activity  . Alcohol use: Yes    Alcohol/week: 0.0 standard drinks    Comment:  has not had alcohol in a month  . Drug use: No  . Sexual activity: Not on file  Lifestyle  . Physical activity:    Days per week: Not on file    Minutes per session: Not on file  . Stress: Not on file  Relationships  . Social connections:    Talks on phone: Not on file    Gets together: Not on file    Attends religious service: Not on file    Active member of club or organization: Not on file    Attends  meetings of clubs or organizations: Not on file    Relationship status: Not on file  Other Topics Concern  . Not on file  Social History Narrative  . Not on file   Family History  Problem Relation Age of Onset  . Diabetes Other    - negative except otherwise stated in the family history section No Known Allergies Prior to Admission medications   Medication Sig Start Date End Date Taking? Authorizing Provider  insulin aspart protamine - aspart (NOVOLOG 70/30 MIX) (70-30) 100 UNIT/ML FlexPen Inject 0.15 mLs (15 Units total) into the skin 2 (two) times daily. 05/19/18  Yes Elgergawy, Silver Huguenin, MD  lisinopril (PRINIVIL,ZESTRIL) 2.5 MG tablet Take 1 tablet (2.5 mg total) by mouth daily. 05/20/18  Yes Elgergawy, Silver Huguenin, MD  sitaGLIPtin-metformin (JANUMET) 50-1000 MG tablet Take 1 tablet by mouth 2 (two) times daily with a meal. 05/19/18  Yes Elgergawy, Silver Huguenin, MD  sulfamethoxazole-trimethoprim (BACTRIM DS,SEPTRA DS) 800-160 MG tablet Take 1 tablet by mouth 2 (two) times daily. 06/08/18  Yes [name], DPM  traMADol (ULTRAM) 50 MG tablet Take 1 tablet (50 mg total) by mouth every 8 (eight) hours as needed. 05/25/18  Yes [name], DPM  ampicillin (PRINCIPEN) 500 MG capsule Take 1 capsule (500 mg total) by mouth 4 (four) times daily. Take 1-2 hours before foot on an empty stomach 06/15/18   Trula Slade, DPM  blood glucose meter kit and supplies Dispense based on patient and insurance preference. Use up to four times daily as directed. (FOR ICD-9 250.00, 250.01). 05/13/16   Barton Dubois, MD  Insulin Pen Needle 31G X 5 MM MISC Use daily to inject insulin as instructed 05/19/18   Elgergawy, Silver Huguenin, MD  lactobacillus acidophilus & bulgar (LACTINEX) chewable tablet Chew 1 tablet by mouth 3 (three) times daily with meals. 05/19/18   Elgergawy, Silver Huguenin, MD  magic mouthwash w/lidocaine SOLN Take 5 mLs by mouth 3 (three) times daily as needed for mouth pain. Patient not taking: Reported on  00/93/8182 9/93/71   Tori Milks, NP   Dg Foot 2 Views Right  Result Date: 06/16/2018 CLINICAL DATA:  Wound infection. Diabetes. EXAM: RIGHT FOOT - 2 VIEW COMPARISON:  06/08/2018 FINDINGS: Sequelae of fifth ray amputation are again identified at the level of the proximal fifth metatarsal. Skin staples have been removed. There is diffuse soft tissue swelling throughout the forefoot with soft tissue emphysema most notable adjacent to the first and fourth MTP joints, new from prior. There is progressive erosion of the head and distal shaft of the fourth metatarsal. There may also be erosion of the base of the fifth toe proximal phalanx and of the head of the third metatarsal. No acute fracture or dislocation is identified. IMPRESSION: Forefoot soft tissue swelling with soft tissue emphysema and progressive erosion of the fourth metatarsal head consistent with soft tissue infection and osteomyelitis. Possible osteomyelitis of the  third metatarsal head and base of the fourth proximal phalanx. Electronically Signed   By: Logan Bores M.D.   On: 06/16/2018 20:01   - pertinent xrays, CT, MRI studies were reviewed and independently interpreted  Positive ROS: All other systems have been reviewed and were otherwise negative with the exception of those mentioned in the HPI and as above.  Physical Exam: General: Alert, no acute distress Psychiatric: Patient is competent for consent with normal mood and affect Lymphatic: No axillary or cervical lymphadenopathy Cardiovascular: No pedal edema Respiratory: No cyanosis, no use of accessory musculature GI: No organomegaly, abdomen is soft and non-tender    Images:  _0 @  Labs:  Lab Results  Component Value Date   HGBA1C 15.9 (H) 05/14/2018   HGBA1C 10.7 (H) 10/04/2016   HGBA1C >15.5 (H) 05/08/2016   ESRSEDRATE 128 (H) 06/15/2018   ESRSEDRATE 47 (H) 05/07/2016   CRP 215.2 (H) 06/15/2018   CRP 22.6 (H) 05/07/2016   REPTSTATUS PENDING  06/17/2018   GRAMSTAIN  06/17/2018    RARE WBC PRESENT, PREDOMINANTLY PMN FEW GRAM POSITIVE COCCI    CULT  06/17/2018    CULTURE REINCUBATED FOR BETTER GROWTH Performed at [facility], [place] [address] [place], [place] [address]    LABORGA NO GROWTH 2 DAYS 02/27/2017    Lab Results  Component Value Date   ALBUMIN 2.4 (L) 06/16/2018   ALBUMIN 2.6 (L) 05/15/2018   ALBUMIN 2.8 (L) 05/14/2018    Neurologic: Patient does not have protective sensation bilateral lower extremities.   MUSCULOSKELETAL:   Skin: Examination patient has an open wound on the right foot there is insufficient viable plantar soft tissue for limb salvage.  All the muscles have been resected due to extensive infection.  Examination of the leg and calf there is no ascending cellulitis no clinical signs of infection in the calf.  Hemoglobin A1c 15.9, albumin 2.4.  Assessment: Assessment: Uncontrolled type 2 diabetes with severe protein caloric malnutrition with infection abscess and nonviable plantar soft tissue on the right foot.  Plan: Plan: Discussed with the patient his best option is to proceed with a transtibial amputation.  Risks and benefits were discussed patient states he understands he states he would like to talk to his family further today regarding surgical intervention.  Discussed that we will maintain n.p.o. after midnight and will reserve time for surgery tomorrow and will proceed with surgery according to his decision.  Thank you for the consult and the opportunity to see Mr. Aleen Sells, MD [facility] [phone] 5:23 PM

## 2018-06-18 NOTE — Progress Notes (Signed)
Pharmacy Antibiotic Note  Edward [name] is a 47 y.o. male admitted on 06/16/2018 with concern for 4th metatarsal osteomyelitis.  Pharmacy consulted for vancomycin and cefepime dosing.   Previous foot wound culture grew out Enterococcus and repeat wound cx from 10/30 is growing out GPCs.  Of note, patient is s/p right 4th ray amputation on 06/17/18.    Renal function is improving.  Afebrile, WBC elevated at 15.2.  Plan: Continue vanc 1gm IV Q8H Change cefepime to 1gm IV Q8H Monitor renal fxn, clinical progress, abx LOT post amputation to determine when to check vanc trough   Height: 6' 0.99" (185.4 cm) Weight: 217 lb 13 oz (98.8 kg) IBW/kg (Calculated) : 79.88  Temp (24hrs), Avg:98.7 F (37.1 C), Min:97.8 F (36.6 C), Max:99 F (37.2 C)  Recent Labs  Lab 06/15/18 1458 06/16/18 1116 06/16/18 1141 06/16/18 1939 06/17/18 0325 06/18/18 0535  WBC 13.5* 15.0*  --   --  15.2*  --   CREATININE 1.70* 1.78*  --   --  1.86* 1.60*  LATICACIDVEN  --   --  1.07 1.08  --   --     Estimated Creatinine Clearance: 69.9 mL/min (A) (by C-G formula based on SCr of 1.6 mg/dL (H)).    No Known Allergies   Cefepime 10/29>> Vanc 10/29 >> Bactrim 10/21 PTA >> 10/29  10/21 right foot wound: Enterococcus faecalis 10/29BCx - NGTD 10/30 surgical screen PCR - negative 10/31 R foot wound - GPCs (likely enterococcus as well)    Alhassan Everingham D. Mina Marble, PharmD, BCPS, [place] 06/18/2018, 10:55 AM

## 2018-06-18 NOTE — Progress Notes (Signed)
I called Edward [name] last night to further discuss and discussed intraoperative findings. He is in agreement with BKA.   Discussed again with Dr. Sharol Given today and he is going to see the patient.

## 2018-06-18 NOTE — Consult Note (Signed)
ORTHOPAEDIC CONSULTATION  REQUESTING PHYSICIAN: Samuella Cota, MD  Chief Complaint: Abscess osteomyelitis right foot status post limb salvage intervention.  HPI: Edward [name] is a 47 y.o. male who presents with osteomyelitis and abscess right foot.  Patient is status post limb salvage intervention he initially underwent 1/5 ray amputation subsequently was reevaluated for fourth ray amputation and had extensive infection which required debridement to the point that patient did not have sufficient soft tissue for limb salvage intervention.  Patient is seen in consultation for transtibial amputation.  Patient has severe protein caloric malnutrition with albumin of 2.4 and uncontrolled type 2 diabetes with his most recent A1c 15.9.  Patient is status post a left foot transmetatarsal amputation.  Past Medical History:  Diagnosis Date  . Anxiety    self reported  . Arthritis   . Depression    self reported  . Diabetes mellitus   . Hypertension    Past Surgical History:  Procedure Laterality Date  . AMPUTATION Left 05/10/2016   Procedure: Left Transmetatarsal Amputation;  Surgeon: [name], MD;  Location: [place];  Service: Orthopedics;  Laterality: Left;  . AMPUTATION Right 05/16/2018   Procedure: PARTIAL RAY AMPUTATION FIFTH TOE RIGHT FOOT;  Surgeon: [name], DPM;  Location: [place];  Service: Podiatry;  Laterality: Right;  . AMPUTATION Right 06/17/2018   Procedure: AMPUTATION 4th RAY Right Foot;  Surgeon: Trula Slade, DPM;  Location: [place];  Service: Podiatry;  Laterality: Right;  . CIRCUMCISION  09/02/2011   Procedure: CIRCUMCISION ADULT;  Surgeon: Molli Hazard, MD;  Location: [place];  Service: Urology;  Laterality: N/A;  . I&D EXTREMITY  09/02/2011   Procedure: IRRIGATION AND DEBRIDEMENT EXTREMITY;  Surgeon: Theodoro Kos, DO;  Location: [place];  Service: Plastics;  Laterality: N/A;  . INCISION AND DRAINAGE OF WOUND   08/12/2011   Procedure: IRRIGATION AND DEBRIDEMENT WOUND;  Surgeon: Zenovia Jarred, MD;  Location: [place];  Service: General;;  perineum  . IRRIGATION AND DEBRIDEMENT ABSCESS  08/12/2011   Procedure: IRRIGATION AND DEBRIDEMENT ABSCESS;  Surgeon: Molli Hazard, MD;  Location: [place];  Service: Urology;  Laterality: N/A;  irrigation and debridment perineum    . IRRIGATION AND DEBRIDEMENT ABSCESS  08/14/2011   Procedure: MINOR INCISION AND DRAINAGE OF ABSCESS;  Surgeon: Molli Hazard, MD;  Location: [place];  Service: Urology;  Laterality: N/A;  Perineal Wound Debridement;Placement Bilateral Testicular Thigh Pouches  . TOTAL HIP ARTHROPLASTY Left 10/11/2016   Procedure: LEFT TOTAL HIP ARTHROPLASTY ANTERIOR APPROACH;  Surgeon: Mcarthur Rossetti, MD;  Location: [facility];  Service: Orthopedics;  Laterality: Left;   Social History   Socioeconomic History  . Marital status: Widowed    Spouse name: Not on file  . Number of children: Not on file  . Years of education: Not on file  . Highest education level: Not on file  Occupational History  . Not on file  Social Needs  . Financial resource strain: Not on file  . Food insecurity:    Worry: Not on file    Inability: Not on file  . Transportation needs:    Medical: Not on file    Non-medical: Not on file  Tobacco Use  . Smoking status: Current Every Day Smoker    Types: Cigarettes  . Smokeless tobacco: Never Used  Substance and Sexual Activity  . Alcohol use: Yes    Alcohol/week: 0.0 standard drinks    Comment:  has not had alcohol in a month  . Drug use: No  . Sexual activity: Not on file  Lifestyle  . Physical activity:    Days per week: Not on file    Minutes per session: Not on file  . Stress: Not on file  Relationships  . Social connections:    Talks on phone: Not on file    Gets together: Not on file    Attends religious service: Not on file    Active member of club or organization: Not on file    Attends  meetings of clubs or organizations: Not on file    Relationship status: Not on file  Other Topics Concern  . Not on file  Social History Narrative  . Not on file   Family History  Problem Relation Age of Onset  . Diabetes Other    - negative except otherwise stated in the family history section No Known Allergies Prior to Admission medications   Medication Sig Start Date End Date Taking? Authorizing Provider  insulin aspart protamine - aspart (NOVOLOG 70/30 MIX) (70-30) 100 UNIT/ML FlexPen Inject 0.15 mLs (15 Units total) into the skin 2 (two) times daily. 05/19/18  Yes Elgergawy, Dawood S, MD  lisinopril (PRINIVIL,ZESTRIL) 2.5 MG tablet Take 1 tablet (2.5 mg total) by mouth daily. 05/20/18  Yes Elgergawy, Dawood S, MD  sitaGLIPtin-metformin (JANUMET) 50-1000 MG tablet Take 1 tablet by mouth 2 (two) times daily with a meal. 05/19/18  Yes Elgergawy, Dawood S, MD  sulfamethoxazole-trimethoprim (BACTRIM DS,SEPTRA DS) 800-160 MG tablet Take 1 tablet by mouth 2 (two) times daily. 06/08/18  Yes Evans, Brent M, DPM  traMADol (ULTRAM) 50 MG tablet Take 1 tablet (50 mg total) by mouth every 8 (eight) hours as needed. 05/25/18  Yes Evans, Brent M, DPM  ampicillin (PRINCIPEN) 500 MG capsule Take 1 capsule (500 mg total) by mouth 4 (four) times daily. Take 1-2 hours before foot on an empty stomach 06/15/18   Wagoner, Matthew R, DPM  blood glucose meter kit and supplies Dispense based on patient and insurance preference. Use up to four times daily as directed. (FOR ICD-9 250.00, 250.01). 05/13/16   Madera, Carlos, MD  Insulin Pen Needle 31G X 5 MM MISC Use daily to inject insulin as instructed 05/19/18   Elgergawy, Dawood S, MD  lactobacillus acidophilus & bulgar (LACTINEX) chewable tablet Chew 1 tablet by mouth 3 (three) times daily with meals. 05/19/18   Elgergawy, Dawood S, MD  magic mouthwash w/lidocaine SOLN Take 5 mLs by mouth 3 (three) times daily as needed for mouth pain. Patient not taking: Reported on  06/16/2018 01/14/17   Defelice, Jeanette, NP   Dg Foot 2 Views Right  Result Date: 06/16/2018 CLINICAL DATA:  Wound infection. Diabetes. EXAM: RIGHT FOOT - 2 VIEW COMPARISON:  06/08/2018 FINDINGS: Sequelae of fifth ray amputation are again identified at the level of the proximal fifth metatarsal. Skin staples have been removed. There is diffuse soft tissue swelling throughout the forefoot with soft tissue emphysema most notable adjacent to the first and fourth MTP joints, new from prior. There is progressive erosion of the head and distal shaft of the fourth metatarsal. There may also be erosion of the base of the fifth toe proximal phalanx and of the head of the third metatarsal. No acute fracture or dislocation is identified. IMPRESSION: Forefoot soft tissue swelling with soft tissue emphysema and progressive erosion of the fourth metatarsal head consistent with soft tissue infection and osteomyelitis. Possible osteomyelitis of the   third metatarsal head and base of the fourth proximal phalanx. Electronically Signed   By: Logan Bores M.D.   On: 06/16/2018 20:01   - pertinent xrays, CT, MRI studies were reviewed and independently interpreted  Positive ROS: All other systems have been reviewed and were otherwise negative with the exception of those mentioned in the HPI and as above.  Physical Exam: General: Alert, no acute distress Psychiatric: Patient is competent for consent with normal mood and affect Lymphatic: No axillary or cervical lymphadenopathy Cardiovascular: No pedal edema Respiratory: No cyanosis, no use of accessory musculature GI: No organomegaly, abdomen is soft and non-tender    Images:  _0 @  Labs:  Lab Results  Component Value Date   HGBA1C 15.9 (H) 05/14/2018   HGBA1C 10.7 (H) 10/04/2016   HGBA1C >15.5 (H) 05/08/2016   ESRSEDRATE 128 (H) 06/15/2018   ESRSEDRATE 47 (H) 05/07/2016   CRP 215.2 (H) 06/15/2018   CRP 22.6 (H) 05/07/2016   REPTSTATUS PENDING  06/17/2018   GRAMSTAIN  06/17/2018    RARE WBC PRESENT, PREDOMINANTLY PMN FEW GRAM POSITIVE COCCI    CULT  06/17/2018    CULTURE REINCUBATED FOR BETTER GROWTH Performed at [facility], [place] [address] [place], [place] [address]    LABORGA NO GROWTH 2 DAYS 02/27/2017    Lab Results  Component Value Date   ALBUMIN 2.4 (L) 06/16/2018   ALBUMIN 2.6 (L) 05/15/2018   ALBUMIN 2.8 (L) 05/14/2018    Neurologic: Patient does not have protective sensation bilateral lower extremities.   MUSCULOSKELETAL:   Skin: Examination patient has an open wound on the right foot there is insufficient viable plantar soft tissue for limb salvage.  All the muscles have been resected due to extensive infection.  Examination of the leg and calf there is no ascending cellulitis no clinical signs of infection in the calf.  Hemoglobin A1c 15.9, albumin 2.4.  Assessment: Assessment: Uncontrolled type 2 diabetes with severe protein caloric malnutrition with infection abscess and nonviable plantar soft tissue on the right foot.  Plan: Plan: Discussed with the patient his best option is to proceed with a transtibial amputation.  Risks and benefits were discussed patient states he understands he states he would like to talk to his family further today regarding surgical intervention.  Discussed that we will maintain n.p.o. after midnight and will reserve time for surgery tomorrow and will proceed with surgery according to his decision.  Thank you for the consult and the opportunity to see Mr. Aleen Sells, MD [facility] [phone] 5:23 PM

## 2018-06-18 NOTE — Progress Notes (Signed)
Initial Nutrition Assessment  DOCUMENTATION CODES:   Not applicable  INTERVENTION:    Pro-stat 30 ml PO BID, each serving provides 100 kcal and 15 gm protein  Multivitamin daily  NUTRITION DIAGNOSIS:   Increased nutrient needs related to wound healing as evidenced by estimated needs.  GOAL:   Patient will meet greater than or equal to 90% of their needs  MONITOR:   PO intake, Supplement acceptance, Skin, Labs  REASON FOR ASSESSMENT:   Consult Diet education  ASSESSMENT:   47 yo male with PMH of DM, HTN, arthritis, and depression who was admitted on 10/29 with osteomyelitis of third & fourth rays of R foot. S/P amputation of 4th ray 10/30.   Plans for BKA R leg soon.  Patient reports appetite is improving. Lunch tray at bedside; almost 100% completion. He says this is the first time since admission that he has been able to finish a meal. He has been trying to eat better at home and realizes that sugary beverages are his major problem. He is trying to omit sugary beverages from his diet.   No recent weight changes noted per patient.  Encouraged patient to maximize protein intake to support healing and good dietary sources of protein.   RD consulted for nutrition education regarding diabetes.   Lab Results  Component Value Date   HGBA1C 15.9 (H) 05/14/2018    RD provided "Carbohydrate Counting for People with Diabetes" handout from the Academy of Nutrition and Dietetics. Discussed different food groups and their effects on blood sugar, emphasizing carbohydrate-containing foods. Provided list of carbohydrates and recommended serving sizes of common foods.  Discussed importance of controlled and consistent carbohydrate intake throughout the day. Provided examples of ways to balance meals/snacks and encouraged intake of high-fiber, whole grain complex carbohydrates. Answered patient's questions regarding diabetes diet guidelines.   Teach back method used.  Expect fair  to good compliance.  Labs reviewed. Sodium 133 (L) CBG's: 137-76 Medications reviewed and include Novolog.   NUTRITION - FOCUSED PHYSICAL EXAM:    Most Recent Value  Orbital Region  No depletion  Upper Arm Region  No depletion  Thoracic and Lumbar Region  No depletion  Buccal Region  No depletion  Temple Region  No depletion  Clavicle Bone Region  No depletion  Clavicle and Acromion Bone Region  No depletion  Scapular Bone Region  No depletion  Dorsal Hand  No depletion  Patellar Region  Unable to assess  Anterior Thigh Region  Unable to assess  Posterior Calf Region  Unable to assess  Edema (RD Assessment)  Unable to assess  Hair  Reviewed  Eyes  Reviewed  Mouth  Reviewed  Skin  Reviewed  Nails  Reviewed       Diet Order:   Diet Order            Diet NPO time specified  Diet effective midnight        Diet heart healthy/carb modified Room service appropriate? Yes; Fluid consistency: Thin  Diet effective now              EDUCATION NEEDS:   Education needs have been addressed  Skin:  Skin Assessment: Skin Integrity Issues: Skin Integrity Issues:: Incisions Incisions: R foot amputation site  Last BM:  PTA  Height:   Ht Readings from Last 1 Encounters:  06/17/18 6' 0.99" (1.854 m)    Weight:   Wt Readings from Last 1 Encounters:  06/17/18 98.8 kg    Ideal Body Weight:  83.6 kg  BMI:  Body mass index is 28.74 kg/m.  Estimated Nutritional Needs:   Kcal:  2300-2500  Protein:  115-130 gm  Fluid:  2.5 L    Molli Barrows, RD, LDN, [place] Pager [phone] After Hours Pager [phone]

## 2018-06-18 NOTE — Progress Notes (Signed)
  PROGRESS NOTE  Edward [name] SWH:[id number] DOB: 09/02/1970 DOA: 06/16/2018 PCP: [name], MD  Brief Narrative: 47 year old man PMH diabetes mellitus type 2 recently admitted for osteomyelitis right foot, status post fifth ray amputation, following with podiatrist as outpatient, readmitted for concerns of osteomyelitis right f foot.  Imaging revealed osteomyelitis of the fourth ray of the right foot and also the third ray.  Admitted for osteomyelitis of the right third and fourth ray right foot  Assessment/Plan Osteomyelitis right third, fourth ray right foot.  Status post amputation fourth ray right foot, surgery revealed wound tracking to calcaneus with minimal viable soft tissue on the plantar aspect of the foot. --Podiatrist as discussed with Dr. Sharol Given, plans for BKA, patient in agreement --Continue empiric antibiotics  Acute kidney injury superimposed on CKD stage III. Lisinopril on hold --Appears to be close to baseline.  BUN has normalized.  Diabetes mellitus type 2, hemoglobin A1c 15.9 May 14, 2018. --CBG remains stable.  Continue insulin 70/30 twice daily, sliding scale insulin  Normocytic anemia, likely chronic disease. --Hemoglobin stable at 10.6.  Follow clinically.   Follow-up further recommendations per orthopedics.  DVT prophylaxis: SCDs Code Status: Full Family Communication: son at bedside Disposition Plan: pending   Murray Hodgkins, MD  [facility] Direct contact: [phone] --Via amion app OR  --[email or web address]; password [email or web address]  7PM-7AM contact night coverage as above 06/18/2018, 3:29 PM  LOS: 2 days   Consultants:  Podiatry  Orthopedics   Procedures:  AMPUTATION 4th RAY Right Foot (Right)  Antimicrobials:  Cefepime 10/29 >>  Vancomycin 10/29 >>  Interval history/Subjective: Continues to have constipation.  Pain in right foot helped by medication but analgesia does not last long enough.  Objective: Vitals:  Vitals:   06/18/18 0533 06/18/18 1216  BP: (!) 141/94 135/89  Pulse: (!) 104 (!) 102  Resp: 20 18  Temp: 99 F (37.2 C) 98.9 F (37.2 C)  SpO2: 94% 100%    Exam: Constitutional:   . Appears calm and comfortable Respiratory:  . CTA bilaterally, no w/r/r.  . Respiratory effort normal.  Cardiovascular:  . RRR, no m/r/g . No signficant BLE extremity edema   Abdomen:  . Soft, nd, mild generalized tenderness Psychiatric:  . Mental status o Mood, affect appropriate  I have personally reviewed the following:   Data: . CBG stable . Creatinine improved, 1.60.  Scheduled Meds: . [START ON 06/19/2018] chlorhexidine  60 mL Topical Once  . feeding supplement (PRO-STAT SUGAR FREE 64)  30 mL Oral BID  . insulin aspart  0-9 Units Subcutaneous TID WC  . insulin aspart protamine- aspart  15 Units Subcutaneous BID WC  . multivitamin with minerals  1 tablet Oral Daily  . polyethylene glycol  17 g Oral Q4H  . senna  1 tablet Oral QHS   Continuous Infusions: . ceFEPime (MAXIPIME) IV    . lactated ringers 10 mL/hr at 06/17/18 0840  . vancomycin Stopped (06/18/18 7793)    Principal Problem:   Osteomyelitis of right foot ([place]) Active Problems:   DM2 (diabetes mellitus, type 2) ([place])   AKI (acute kidney injury) ([place])   Normocytic anemia   LOS: 2 days

## 2018-06-18 NOTE — Anesthesia Postprocedure Evaluation (Signed)
Anesthesia Post Note  Patient: Edward [name]  Procedure(s) Performed: AMPUTATION 4th RAY Right Foot (Right Foot)     Patient location during evaluation: PACU Anesthesia Type: General Level of consciousness: sedated and patient cooperative Pain management: pain level controlled Vital Signs Assessment: post-procedure vital signs reviewed and stable Respiratory status: spontaneous breathing Cardiovascular status: stable Anesthetic complications: no    Last Vitals:  Vitals:   06/18/18 0041 06/18/18 0533  BP: 132/81 (!) 141/94  Pulse: 98 (!) 104  Resp: 20 20  Temp: 37.2 C 37.2 C  SpO2: 99% 94%    Last Pain:  Vitals:   06/18/18 0637  TempSrc:   PainSc: 0-No pain                 Nolon Nations

## 2018-06-19 ENCOUNTER — Encounter (HOSPITAL_COMMUNITY): Payer: Self-pay | Admitting: Orthopedic Surgery

## 2018-06-19 ENCOUNTER — Inpatient Hospital Stay (HOSPITAL_COMMUNITY): Payer: Medicaid Other | Admitting: Certified Registered Nurse Anesthetist

## 2018-06-19 ENCOUNTER — Encounter (HOSPITAL_COMMUNITY)
Admission: EM | Disposition: A | Discharge status: Rehabilitation Facility | Payer: Self-pay | Source: Home / Self Care | Attending: Family Medicine

## 2018-06-19 DIAGNOSIS — M86271 Subacute osteomyelitis, right ankle and foot: Secondary | ICD-10-CM

## 2018-06-19 DIAGNOSIS — N183 Chronic kidney disease, stage 3 (moderate): Secondary | ICD-10-CM

## 2018-06-19 HISTORY — PX: APPLICATION OF WOUND VAC: SHX5189

## 2018-06-19 HISTORY — PX: AMPUTATION: SHX166

## 2018-06-19 LAB — GLUCOSE, CAPILLARY
GLUCOSE-CAPILLARY: 131 mg/dL — AB (ref 70–99)
GLUCOSE-CAPILLARY: 221 mg/dL — AB (ref 70–99)
Glucose-Capillary: 153 mg/dL — ABNORMAL HIGH (ref 70–99)
Glucose-Capillary: 169 mg/dL — ABNORMAL HIGH (ref 70–99)
Glucose-Capillary: 166 mg/dL — ABNORMAL HIGH (ref 70–99)

## 2018-06-19 SURGERY — AMPUTATION BELOW KNEE
Anesthesia: General | Site: Leg Lower | Laterality: Right

## 2018-06-19 MED ORDER — SODIUM CHLORIDE 0.9 % IV SOLN
INTRAVENOUS | Status: DC
  Administered 2018-06-19: 18:00:00 via INTRAVENOUS

## 2018-06-19 MED ORDER — ONDANSETRON HCL 4 MG/2ML IJ SOLN: 4.0000 mg | Freq: Four times a day (QID) | INTRAMUSCULAR | Status: DC | PRN

## 2018-06-19 MED ORDER — SUCCINYLCHOLINE CHLORIDE 20 MG/ML IJ SOLN
INTRAMUSCULAR | Status: DC | PRN
  Administered 2018-06-19: 140 mg via INTRAVENOUS

## 2018-06-19 MED ORDER — METHOCARBAMOL 500 MG PO TABS
ORAL_TABLET | ORAL | Status: AC
  Filled 2018-06-19: qty 1

## 2018-06-19 MED ORDER — ALBUTEROL SULFATE HFA 108 (90 BASE) MCG/ACT IN AERS
INHALATION_SPRAY | RESPIRATORY_TRACT | Status: DC | PRN
  Administered 2018-06-19: 2 via RESPIRATORY_TRACT

## 2018-06-19 MED ORDER — FENTANYL CITRATE (PF) 100 MCG/2ML IJ SOLN: 25.0000 ug | INTRAMUSCULAR | Status: DC | PRN

## 2018-06-19 MED ORDER — MAGNESIUM CITRATE PO SOLN: 1.0000 | Freq: Once | ORAL | Status: DC | PRN

## 2018-06-19 MED ORDER — DOCUSATE SODIUM 100 MG PO CAPS
100.0000 mg | ORAL_CAPSULE | Freq: Two times a day (BID) | ORAL | Status: DC
  Administered 2018-06-20 – 2018-06-21 (×3): 100 mg via ORAL
  Filled 2018-06-19 (×3): qty 1

## 2018-06-19 MED ORDER — LIDOCAINE 2% (20 MG/ML) 5 ML SYRINGE
INTRAMUSCULAR | Status: AC
  Filled 2018-06-19: qty 5

## 2018-06-19 MED ORDER — ONDANSETRON HCL 4 MG/2ML IJ SOLN
INTRAMUSCULAR | Status: AC
  Filled 2018-06-19: qty 2

## 2018-06-19 MED ORDER — PROPOFOL 10 MG/ML IV BOLUS
INTRAVENOUS | Status: DC | PRN
  Administered 2018-06-19: 200 mg via INTRAVENOUS
  Administered 2018-06-19: 100 mg via INTRAVENOUS

## 2018-06-19 MED ORDER — HYDROMORPHONE HCL 1 MG/ML IJ SOLN
0.5000 mg | INTRAMUSCULAR | Status: DC | PRN
  Administered 2018-06-19 – 2018-06-21 (×3): 1 mg via INTRAVENOUS
  Filled 2018-06-19 (×3): qty 1

## 2018-06-19 MED ORDER — HYDROMORPHONE HCL 1 MG/ML IJ SOLN
INTRAMUSCULAR | Status: AC
  Administered 2018-06-19: 0.5 mg via INTRAVENOUS
  Filled 2018-06-19: qty 2

## 2018-06-19 MED ORDER — LACTATED RINGERS IV SOLN: INTRAVENOUS | Status: DC

## 2018-06-19 MED ORDER — POLYETHYLENE GLYCOL 3350 17 G PO PACK: 17.0000 g | PACK | Freq: Every day | ORAL | Status: DC | PRN

## 2018-06-19 MED ORDER — METOCLOPRAMIDE HCL 5 MG/ML IJ SOLN: 5.0000 mg | Freq: Three times a day (TID) | INTRAMUSCULAR | Status: DC | PRN

## 2018-06-19 MED ORDER — FENTANYL CITRATE (PF) 250 MCG/5ML IJ SOLN
INTRAMUSCULAR | Status: AC
  Filled 2018-06-19: qty 5

## 2018-06-19 MED ORDER — ONDANSETRON HCL 4 MG/2ML IJ SOLN
INTRAMUSCULAR | Status: DC | PRN
  Administered 2018-06-19: 4 mg via INTRAVENOUS

## 2018-06-19 MED ORDER — METOCLOPRAMIDE HCL 5 MG PO TABS: 5.0000 mg | ORAL_TABLET | Freq: Three times a day (TID) | ORAL | Status: DC | PRN

## 2018-06-19 MED ORDER — LABETALOL HCL 5 MG/ML IV SOLN
10.0000 mg | INTRAVENOUS | Status: DC | PRN
  Administered 2018-06-19: 10 mg via INTRAVENOUS

## 2018-06-19 MED ORDER — ONDANSETRON HCL 4 MG PO TABS: 4.0000 mg | ORAL_TABLET | Freq: Four times a day (QID) | ORAL | Status: DC | PRN

## 2018-06-19 MED ORDER — 0.9 % SODIUM CHLORIDE (POUR BTL) OPTIME
TOPICAL | Status: DC | PRN
  Administered 2018-06-19: 1000 mL

## 2018-06-19 MED ORDER — METOCLOPRAMIDE HCL 5 MG/ML IJ SOLN: 10.0000 mg | Freq: Once | INTRAMUSCULAR | Status: DC | PRN

## 2018-06-19 MED ORDER — LACTATED RINGERS IV SOLN
INTRAVENOUS | Status: DC
  Administered 2018-06-19 (×2): via INTRAVENOUS

## 2018-06-19 MED ORDER — DEXAMETHASONE SODIUM PHOSPHATE 10 MG/ML IJ SOLN
INTRAMUSCULAR | Status: DC | PRN
  Administered 2018-06-19: 10 mg via INTRAVENOUS

## 2018-06-19 MED ORDER — DEXAMETHASONE SODIUM PHOSPHATE 10 MG/ML IJ SOLN
INTRAMUSCULAR | Status: AC
  Filled 2018-06-19: qty 1

## 2018-06-19 MED ORDER — HYDROMORPHONE HCL 1 MG/ML IJ SOLN
0.5000 mg | INTRAMUSCULAR | Status: AC | PRN
  Administered 2018-06-19 (×4): 0.5 mg via INTRAVENOUS

## 2018-06-19 MED ORDER — ACETAMINOPHEN 325 MG PO TABS: 325.0000 mg | ORAL_TABLET | Freq: Four times a day (QID) | ORAL | Status: DC | PRN

## 2018-06-19 MED ORDER — BISACODYL 10 MG RE SUPP: 10.0000 mg | Freq: Every day | RECTAL | Status: DC | PRN

## 2018-06-19 MED ORDER — METHOCARBAMOL 500 MG PO TABS
500.0000 mg | ORAL_TABLET | Freq: Four times a day (QID) | ORAL | Status: DC | PRN
  Administered 2018-06-19 – 2018-06-21 (×4): 500 mg via ORAL
  Filled 2018-06-19 (×3): qty 1

## 2018-06-19 MED ORDER — METHOCARBAMOL 1000 MG/10ML IJ SOLN
500.0000 mg | Freq: Four times a day (QID) | INTRAVENOUS | Status: DC | PRN
  Filled 2018-06-19: qty 5

## 2018-06-19 MED ORDER — FENTANYL CITRATE (PF) 250 MCG/5ML IJ SOLN
INTRAMUSCULAR | Status: DC | PRN
  Administered 2018-06-19 (×3): 50 ug via INTRAVENOUS
  Administered 2018-06-19: 100 ug via INTRAVENOUS
  Administered 2018-06-19: 50 ug via INTRAVENOUS
  Administered 2018-06-19: 25 ug via INTRAVENOUS
  Administered 2018-06-19: 50 ug via INTRAVENOUS

## 2018-06-19 MED ORDER — CEFAZOLIN SODIUM-DEXTROSE 2-4 GM/100ML-% IV SOLN
2.0000 g | INTRAVENOUS | Status: AC
  Administered 2018-06-19: 2 g via INTRAVENOUS
  Filled 2018-06-19: qty 100

## 2018-06-19 MED ORDER — OXYCODONE HCL 5 MG PO TABS
5.0000 mg | ORAL_TABLET | ORAL | Status: DC | PRN
  Administered 2018-06-21 – 2018-06-22 (×2): 10 mg via ORAL
  Filled 2018-06-19 (×3): qty 2

## 2018-06-19 MED ORDER — OXYCODONE HCL 5 MG PO TABS
ORAL_TABLET | ORAL | Status: AC
  Filled 2018-06-19: qty 3

## 2018-06-19 MED ORDER — MIDAZOLAM HCL 2 MG/2ML IJ SOLN
INTRAMUSCULAR | Status: DC | PRN
  Administered 2018-06-19: 1 mg via INTRAVENOUS

## 2018-06-19 MED ORDER — OXYCODONE HCL 5 MG PO TABS
10.0000 mg | ORAL_TABLET | ORAL | Status: DC | PRN
  Administered 2018-06-19 – 2018-06-20 (×5): 15 mg via ORAL
  Administered 2018-06-20: 10 mg via ORAL
  Administered 2018-06-20 – 2018-06-21 (×2): 15 mg via ORAL
  Administered 2018-06-22: 10 mg via ORAL
  Filled 2018-06-19: qty 2
  Filled 2018-06-19 (×7): qty 3

## 2018-06-19 MED ORDER — MEPERIDINE HCL 50 MG/ML IJ SOLN: 6.2500 mg | INTRAMUSCULAR | Status: DC | PRN

## 2018-06-19 MED ORDER — ROCURONIUM BROMIDE 50 MG/5ML IV SOSY
PREFILLED_SYRINGE | INTRAVENOUS | Status: AC
  Filled 2018-06-19: qty 5

## 2018-06-19 MED ORDER — HYDROMORPHONE HCL 1 MG/ML IJ SOLN
0.5000 mg | INTRAMUSCULAR | Status: AC | PRN
  Administered 2018-06-19 (×2): 0.5 mg via INTRAVENOUS

## 2018-06-19 MED ORDER — MIDAZOLAM HCL 2 MG/2ML IJ SOLN
INTRAMUSCULAR | Status: AC
  Filled 2018-06-19: qty 2

## 2018-06-19 MED ORDER — LIDOCAINE HCL (CARDIAC) PF 100 MG/5ML IV SOSY
PREFILLED_SYRINGE | INTRAVENOUS | Status: DC | PRN
  Administered 2018-06-19: 50 mg via INTRATRACHEAL
  Administered 2018-06-19: 100 mg via INTRATRACHEAL

## 2018-06-19 MED ORDER — ALBUTEROL SULFATE HFA 108 (90 BASE) MCG/ACT IN AERS
INHALATION_SPRAY | RESPIRATORY_TRACT | Status: AC
  Filled 2018-06-19: qty 6.7

## 2018-06-19 MED ORDER — PROPOFOL 10 MG/ML IV BOLUS
INTRAVENOUS | Status: AC
  Filled 2018-06-19: qty 20

## 2018-06-19 SURGICAL SUPPLY — 33 items
APL SKNCLS STERI-STRIP NONHPOA (GAUZE/BANDAGES/DRESSINGS) ×4
BENZOIN TINCTURE PRP APPL 2/3 (GAUZE/BANDAGES/DRESSINGS) ×12 IMPLANT
BLADE SAW RECIP 87.9 MT (BLADE) ×3 IMPLANT
BLADE SURG 21 STRL SS (BLADE) ×3 IMPLANT
CANISTER WOUND CARE 500ML ATS (WOUND CARE) ×2 IMPLANT
COVER SURGICAL LIGHT HANDLE (MISCELLANEOUS) ×5 IMPLANT
COVER WAND RF STERILE (DRAPES) ×3 IMPLANT
DRAPE INCISE IOBAN 66X45 STRL (DRAPES) ×2 IMPLANT
DRAPE U-SHAPE 47X51 STRL (DRAPES) ×3 IMPLANT
DURAPREP 26ML APPLICATOR (WOUND CARE) ×3 IMPLANT
ELECT REM PT RETURN 9FT ADLT (ELECTROSURGICAL) ×3
ELECTRODE REM PT RTRN 9FT ADLT (ELECTROSURGICAL) ×1 IMPLANT
GLOVE BIOGEL PI IND STRL 9 (GLOVE) ×1 IMPLANT
GLOVE BIOGEL PI INDICATOR 9 (GLOVE) ×2
GLOVE SURG ORTHO 9.0 STRL STRW (GLOVE) ×3 IMPLANT
GOWN STRL REUS W/ TWL XL LVL3 (GOWN DISPOSABLE) ×2 IMPLANT
GOWN STRL REUS W/TWL XL LVL3 (GOWN DISPOSABLE) ×3
KIT BASIN OR (CUSTOM PROCEDURE TRAY) ×3 IMPLANT
KIT TURNOVER KIT B (KITS) ×3 IMPLANT
MANIFOLD NEPTUNE II (INSTRUMENTS) ×3 IMPLANT
NS IRRIG 1000ML POUR BTL (IV SOLUTION) ×3 IMPLANT
PACK ORTHO EXTREMITY (CUSTOM PROCEDURE TRAY) ×3 IMPLANT
PAD ARMBOARD 7.5X6 YLW CONV (MISCELLANEOUS) ×3 IMPLANT
PREVENA RESTOR ARTHOFORM 33X30 (CANNISTER) ×2 IMPLANT
STAPLER VISISTAT 35W (STAPLE) ×2 IMPLANT
STOCKINETTE IMPERVIOUS LG (DRAPES) ×3 IMPLANT
SUT SILK 2 0 (SUTURE) ×3
SUT SILK 2 0 REEL (SUTURE) ×2 IMPLANT
SUT SILK 2-0 18XBRD TIE 12 (SUTURE) ×1 IMPLANT
SUT VIC AB 1 CTX 27 (SUTURE) ×4 IMPLANT
TUBE CONNECTING 12'X1/4 (SUCTIONS) ×1
TUBE CONNECTING 12X1/4 (SUCTIONS) ×2 IMPLANT
YANKAUER SUCT BULB TIP NO VENT (SUCTIONS) ×3 IMPLANT

## 2018-06-19 NOTE — Transfer of Care (Signed)
Immediate Anesthesia Transfer of Care Note  Patient: Edward [name]  Procedure(s) Performed: RIGHT BELOW KNEE AMPUTATION (Right Leg Lower) APPLICATION OF WOUND VAC (Right Leg Lower)  Patient Location: PACU  Anesthesia Type:General  Level of Consciousness: awake, alert  and patient cooperative  Airway & Oxygen Therapy: Patient Spontanous Breathing and Patient connected to face mask oxygen  Post-op Assessment: Report given to RN, Post -op Vital signs reviewed and stable and Patient moving all extremities  Post vital signs: Reviewed and stable  Last Vitals:  Vitals Value Taken Time  BP 182/110 06/19/2018  1:28 PM  Temp    Pulse 113 06/19/2018  1:26 PM  Resp 19 06/19/2018  1:29 PM  SpO2 97 % 06/19/2018  1:26 PM  Vitals shown include unvalidated device data.  Last Pain:  Vitals:   06/19/18 0731  TempSrc:   PainSc: 0-No pain      Patients Stated Pain Goal: 2 (24/11/46 4314)  Complications: No apparent anesthesia complications

## 2018-06-19 NOTE — Interval H&P Note (Signed)
History and Physical Interval Note:  06/19/2018 8:20 AM  Edward [name]  has presented today for surgery, with the diagnosis of Osteomyelitis, Abscess Right Foot  The various methods of treatment have been discussed with the patient and family. After consideration of risks, benefits and other options for treatment, the patient has consented to  Procedure(s): RIGHT BELOW KNEE AMPUTATION (Right) as a surgical intervention .  The patient's history has been reviewed, patient examined, no change in status, stable for surgery.  I have reviewed the patient's chart and labs.  Questions were answered to the patient's satisfaction.     [name]

## 2018-06-19 NOTE — Anesthesia Procedure Notes (Signed)
Procedure Name: Intubation Date/Time: 06/19/2018 12:31 PM Performed by: Kathryne Hitch, CRNA Pre-anesthesia Checklist: Patient identified, Emergency Drugs available, Suction available and Patient being monitored Patient Re-evaluated:Patient Re-evaluated prior to induction Oxygen Delivery Method: Circle system utilized Preoxygenation: Pre-oxygenation with 100% oxygen Induction Type: IV induction Ventilation: Oral airway inserted - appropriate to patient size and Mask ventilation without difficulty Laryngoscope Size: Miller and 2 Grade View: Grade I Tube type: Oral Tube size: 7.5 mm Number of attempts: 1 Airway Equipment and Method: Patient positioned with wedge pillow Placement Confirmation: ETT inserted through vocal cords under direct vision,  positive ETCO2 and breath sounds checked- equal and bilateral Secured at: 22 cm Tube secured with: Tape Dental Injury: Teeth and Oropharynx as per pre-operative assessment

## 2018-06-19 NOTE — Anesthesia Preprocedure Evaluation (Signed)
Anesthesia Evaluation  Patient identified by MRN, date of birth, ID band Patient awake    Reviewed: Allergy & Precautions, NPO status , Patient's Chart, lab work & pertinent test results  Airway Mallampati: II  TM Distance: >3 FB     Dental   Pulmonary Current Smoker,    breath sounds clear to auscultation       Cardiovascular hypertension,  Rhythm:Regular Rate:Normal     Neuro/Psych PSYCHIATRIC DISORDERS Anxiety Depression negative neurological ROS     GI/Hepatic negative GI ROS, Neg liver ROS,   Endo/Other  diabetes, Type 2, Insulin Dependent  Renal/GU Renal InsufficiencyRenal disease     Musculoskeletal  (+) Arthritis ,   Abdominal   Peds  Hematology  (+) anemia ,   Anesthesia Other Findings   Reproductive/Obstetrics                             Lab Results  Component Value Date   WBC 15.2 (H) 06/17/2018   HGB 10.6 (L) 06/17/2018   HCT 34.9 (L) 06/17/2018   MCV 83.7 06/17/2018   PLT 448 (H) 06/17/2018   Lab Results  Component Value Date   CREATININE 1.60 (H) 06/18/2018   BUN 20 06/18/2018   NA 133 (L) 06/18/2018   K 4.2 06/18/2018   CL 107 06/18/2018   CO2 21 (L) 06/18/2018    Anesthesia Physical  Anesthesia Plan  ASA: III  Anesthesia Plan: General   Post-op Pain Management:    Induction: Intravenous  PONV Risk Score and Plan: 2 and Ondansetron, Treatment may vary due to age or medical condition and Dexamethasone  Airway Management Planned: LMA  Additional Equipment: None  Intra-op Plan:   Post-operative Plan:   Informed Consent: I have reviewed the patients History and Physical, chart, labs and discussed the procedure including the risks, benefits and alternatives for the proposed anesthesia with the patient or authorized representative who has indicated his/her understanding and acceptance.   Dental advisory given  Plan Discussed with: CRNA  Anesthesia  Plan Comments:         Anesthesia Quick Evaluation

## 2018-06-19 NOTE — Anesthesia Postprocedure Evaluation (Signed)
Anesthesia Post Note  Patient: Edward [name]  Procedure(s) Performed: RIGHT BELOW KNEE AMPUTATION (Right Leg Lower) APPLICATION OF WOUND VAC (Right Leg Lower)     Anesthesia Post Evaluation  Last Vitals:  Vitals:   06/18/18 2048 06/19/18 0413  BP: 137/90 (!) 133/96  Pulse: (!) 104 98  Resp: 20 16  Temp: 37.2 C 37 C  SpO2: 100% 96%    Last Pain:  Vitals:   06/19/18 0731  TempSrc:   PainSc: 0-No pain                 Kathryne Hitch

## 2018-06-19 NOTE — Progress Notes (Signed)
PROGRESS NOTE  Edward [name] FXT:[id number] DOB: Oct 04, 1970 DOA: 06/16/2018 PCP: [name], MD  Brief Narrative: 47 year old man PMH diabetes mellitus type 2 recently admitted for osteomyelitis right foot, status post fifth ray amputation, following with podiatrist as outpatient, readmitted for concerns of osteomyelitis right f foot.  Imaging revealed osteomyelitis of the fourth ray of the right foot and also the third ray.  Admitted for osteomyelitis of the right third and fourth ray right foot.  Underwent surgery, unfortunately foot was felt to be nonviable, orthopedics was consulted and the patient underwent transtibial amputation right leg 11/1.  Assessment/Plan Osteomyelitis right third, fourth ray right foot.  Status post amputation fourth ray right foot, surgery revealed wound tracking to calcaneus with minimal viable soft tissue on the plantar aspect of the foot. --Status post transtibial amputation today.  We will continue antibiotics today, can discontinue tomorrow.  Acute kidney injury superimposed on CKD stage III. Lisinopril on hold --Acute component appears resolved.  Diabetes mellitus type 2, hemoglobin A1c 15.9 May 14, 2018. --CBG remains stable..  Continue insulin 70/30 twice daily, sliding scale insulin  Normocytic anemia, likely chronic disease. --Check CBC in a.m.   Follow-up further orthopedic recommendations and physical therapy recommendations.  DVT prophylaxis: SCDs Code Status: Full Family Communication: Younger son at bedside Disposition Plan: pending   Murray Hodgkins, MD  [facility] Direct contact: [phone] --Via amion app OR  --[email or web address]; password [email or web address]  7PM-7AM contact night coverage as above 06/19/2018, 2:12 PM  LOS: 3 days   Consultants:  Podiatry  Orthopedics   Procedures:  AMPUTATION 4th RAY Right Foot (Right)  Transtibial amputation right leg, Application of Prevena wound VAC  Antimicrobials:  Cefepime  10/29 >>  Vancomycin 10/29 >>  Interval history/Subjective: Had a bowel movement.  Feels better in this regard.  Objective: Vitals:  Vitals:   06/19/18 1356 06/19/18 1403  BP: (!) 188/106 (!) 185/109  Pulse:  (!) 113  Resp: (!) 31 (!) 52  Temp:    SpO2:  95%    Exam: Constitutional:   . Appears calm and comfortable Respiratory:  . CTA bilaterally, no w/r/r.  . Respiratory effort normal.  Cardiovascular:  . RRR, no m/r/g . No significant LE extremity edema   Psychiatric:  . Mental status o Mood, affect appropriate  I have personally reviewed the following:   Data: . CBG remains stable  Scheduled Meds: . [MAR Hold] feeding supplement (PRO-STAT SUGAR FREE 64)  30 mL Oral BID  . [MAR Hold] insulin aspart  0-9 Units Subcutaneous TID WC  . [MAR Hold] insulin aspart protamine- aspart  15 Units Subcutaneous BID WC  . methocarbamol      . [MAR Hold] multivitamin with minerals  1 tablet Oral Daily  . oxyCODONE      . [MAR Hold] senna  1 tablet Oral QHS   Continuous Infusions: . [MAR Hold] ceFEPime (MAXIPIME) IV 1 g (06/19/18 0547)  . lactated ringers 10 mL/hr at 06/17/18 0840  . lactated ringers 10 mL/hr at 06/19/18 1051  . lactated ringers    . methocarbamol (ROBAXIN) IV    . [MAR Hold] vancomycin 1,000 mg (06/19/18 0631)    Principal Problem:   Osteomyelitis of right foot ([facility]) Active Problems:   DM2 (diabetes mellitus, type 2) ([facility])   AKI (acute kidney injury) ([place])   Normocytic anemia   Diabetic polyneuropathy associated with type 2 diabetes mellitus ([place])   Severe protein-calorie malnutrition ([place])   LOS: 3 days

## 2018-06-19 NOTE — Op Note (Signed)
   Date of Surgery: 06/19/2018  INDICATIONS: Mr. Amescua is a 47 y.o.-year-old male who has undergone limb salvage for right foot, however patient has had extensive soft tissue necrosis and does not have foot salvage options.Marland Kitchen  PREOPERATIVE DIAGNOSIS: osteomyelitis and ulceration right foot with extensive soft tissue destruction  POSTOPERATIVE DIAGNOSIS: Same.  PROCEDURE: Transtibial amputation Application of Prevena wound VAC  SURGEON: Sharol Given, M.D.  ANESTHESIA:  general  IV FLUIDS AND URINE: See anesthesia.  ESTIMATED BLOOD LOSS: 50 mL.  COMPLICATIONS: None.  DESCRIPTION OF PROCEDURE: The patient was brought to the operating room and underwent a general anesthetic. After adequate levels of anesthesia were obtained patient's lower extremity was prepped using DuraPrep draped into a sterile field. A timeout was called. The foot was draped out of the sterile field with impervious stockinette. A transverse incision was made 11 cm distal to the tibial tubercle. This curved proximally and a large posterior flap was created. The tibia was transected 1 cm proximal to the skin incision. The fibula was transected just proximal to the tibial incision. The tibia was beveled anteriorly. A large posterior flap was created. The sciatic nerve was pulled cut and allowed to retract. The vascular bundles were suture ligated with 2-0 silk. The deep and superficial fascial layers were closed using #1 Vicryl. The skin was closed using staples and 2-0 nylon. The wound was covered with a Prevena incisional and restor wound VAC. There was a good suction fit. A prosthetic shrinker will be applied on the floor. Patient was extubated taken to the PACU in stable condition.   DISCHARGE PLANNING:  Antibiotic duration:24 hours post op  Weightbearing: NWB right  Pain medication: opoid pathway ordered  Dressing care/ Wound VAC:VAC for 1 week  Discharge to: SNF  Follow-up: In the office 1 week post  operative.  Meridee Score, MD [facility] 1:21 PM

## 2018-06-20 ENCOUNTER — Encounter (HOSPITAL_COMMUNITY): Payer: Self-pay | Admitting: Orthopedic Surgery

## 2018-06-20 DIAGNOSIS — E875 Hyperkalemia: Secondary | ICD-10-CM

## 2018-06-20 LAB — BASIC METABOLIC PANEL
ANION GAP: 8 (ref 5–15)
BUN: 29 mg/dL — ABNORMAL HIGH (ref 6–20)
CHLORIDE: 104 mmol/L (ref 98–111)
CO2: 19 mmol/L — ABNORMAL LOW (ref 22–32)
CREATININE: 1.77 mg/dL — AB (ref 0.61–1.24)
Calcium: 8.3 mg/dL — ABNORMAL LOW (ref 8.9–10.3)
GFR calc non Af Amer: 44 mL/min — ABNORMAL LOW (ref >60)
GFR, EST AFRICAN AMERICAN: 51 mL/min — AB (ref >60)
Glucose, Bld: 317 mg/dL — ABNORMAL HIGH (ref 70–99)
POTASSIUM: 5.9 mmol/L — AB (ref 3.5–5.1)
SODIUM: 131 mmol/L — AB (ref 135–145)

## 2018-06-20 LAB — CBC
HCT: 34.2 % — ABNORMAL LOW (ref 39.0–52.0)
Hemoglobin: 10.4 g/dL — ABNORMAL LOW (ref 13.0–17.0)
MCH: 25.4 pg — AB (ref 26.0–34.0)
MCHC: 30.4 g/dL (ref 30.0–36.0)
MCV: 83.6 fL (ref 80.0–100.0)
Platelets: 501 10*3/uL — ABNORMAL HIGH (ref 150–400)
RBC: 4.09 MIL/uL — ABNORMAL LOW (ref 4.22–5.81)
RDW: 12.3 % (ref 11.5–15.5)
WBC: 11.5 10*3/uL — AB (ref 4.0–10.5)
nRBC: 0 % (ref 0.0–0.2)

## 2018-06-20 LAB — GLUCOSE, CAPILLARY
GLUCOSE-CAPILLARY: 330 mg/dL — AB (ref 70–99)
GLUCOSE-CAPILLARY: 213 mg/dL — AB (ref 70–99)
GLUCOSE-CAPILLARY: 76 mg/dL (ref 70–99)
Glucose-Capillary: 168 mg/dL — ABNORMAL HIGH (ref 70–99)

## 2018-06-20 MED ORDER — SODIUM CHLORIDE 0.9 % IV BOLUS
500.0000 mL | Freq: Once | INTRAVENOUS | Status: AC
  Administered 2018-06-20: 500 mL via INTRAVENOUS

## 2018-06-20 MED ORDER — ALUM & MAG HYDROXIDE-SIMETH 200-200-20 MG/5ML PO SUSP
30.0000 mL | ORAL | Status: DC | PRN
  Administered 2018-06-20: 30 mL via ORAL
  Filled 2018-06-20: qty 30

## 2018-06-20 MED ORDER — SODIUM CHLORIDE 0.9 % IV SOLN
INTRAVENOUS | Status: DC
  Administered 2018-06-20 – 2018-06-21 (×3): via INTRAVENOUS

## 2018-06-20 MED ORDER — INSULIN ASPART PROT & ASPART (70-30 MIX) 100 UNIT/ML ~~LOC~~ SUSP
20.0000 [IU] | Freq: Two times a day (BID) | SUBCUTANEOUS | Status: DC
  Administered 2018-06-20 – 2018-06-22 (×5): 20 [IU] via SUBCUTANEOUS
  Filled 2018-06-20: qty 10

## 2018-06-20 MED ORDER — POLYETHYLENE GLYCOL 3350 17 G PO PACK
17.0000 g | PACK | Freq: Two times a day (BID) | ORAL | Status: DC
  Administered 2018-06-20 – 2018-06-21 (×3): 17 g via ORAL
  Filled 2018-06-20 (×2): qty 1

## 2018-06-20 NOTE — Plan of Care (Signed)

## 2018-06-20 NOTE — Progress Notes (Signed)
Patient ID: Edward [name], male   DOB: 08/06/1971, 47 y.o.   MRN: [id number] Postoperative day 1 transtibial amputation right foot.  There is no drainage in the wound VAC canister.  We will have [employer] apply a stump shrinker and limb guard protector.  Discharge planning pending results of his therapy.  Patient may benefit from inpatient rehab.

## 2018-06-20 NOTE — Progress Notes (Signed)
Orthopedic Tech Progress Note Patient Details:  Edward [name] Oct 09, 1970 [id number]  Patient ID: Edward [name], male   DOB: [birth year], 47 y.o.   MRN: [id number]   Edward [name] 06/20/2018, 12:56 PMCalled [employer] for stump shrinked and limb guard.

## 2018-06-20 NOTE — Evaluation (Signed)
Physical Therapy Evaluation Patient Details Name: Edward [name] MRN: [id number] DOB: 1970/10/10 Today's Date: 06/20/2018   History of Present Illness  47 y/o male admitted secondary to R foot osteomyelitis. Pt was/p R partial 5th ray amputation but now is BK amputee with surgery 06/19/18.  Has AKI, nausea and has protein and calorie malnutrition.  PMH includes DM, HTN, and L transmet amputation, anemia, CKD 3,   Clinical Impression  Pt was seen for evaluation of mobility and discussion of plan of care.  He is expecting to go home with family there around the clock, but is going to need more independence with gait and transfers to get there.  Additionally he may not be able to gain admission to rehab but recommending CIR as he was walking and able to ambulate independently with AD prior to this amputation.  Complicated by L transmet amp and his pain in R leg from surgery.  Discussed phantom pain and phantom limb sensation with pt who verbalizes understanding.    Follow Up Recommendations CIR    Equipment Recommendations  None recommended by PT    Recommendations for Other Services       Precautions / Restrictions Precautions Precautions: Fall Restrictions Weight Bearing Restrictions: Yes RLE Weight Bearing: Non weight bearing Other Position/Activity Restrictions: will be receiving positioning splint from [employer]      Mobility  Bed Mobility Overal bed mobility: Needs Assistance Bed Mobility: Supine to Sit;Sit to Supine     Supine to sit: Min assist Sit to supine: Min guard   General bed mobility comments: pt is requiring help to lift trunk up to side of bed then was able to transition back to bed with min guard after mobility training  Transfers Overall transfer level: Needs assistance Equipment used: Rolling walker (2 wheeled);1 person hand held assist Transfers: Sit to/from Stand Sit to Stand: Min assist;Mod assist         General transfer comment: mod assist to power  up then min assist to steady  Ambulation/Gait Ambulation/Gait assistance: Min assist Gait Distance (Feet): 4 Feet Assistive device: Rolling walker (2 wheeled);1 person hand held assist   Gait velocity: reduced Gait velocity interpretation: <1.8 ft/sec, indicate of risk for recurrent falls General Gait Details: pt stood side of bed and was unsteady so practiced mult times to get side shift of LLE on RW with min assist, heavy reliance on RW  Stairs            Wheelchair Mobility    Modified Rankin (Stroke Patients Only)       Balance Overall balance assessment: Needs assistance Sitting-balance support: Bilateral upper extremity supported;Single extremity supported Sitting balance-Leahy Scale: Fair Sitting balance - Comments: improved to good after practicing   Standing balance support: Bilateral upper extremity supported;During functional activity Standing balance-Leahy Scale: Poor Standing balance comment: working to control balance on RW with LLE which has transmet amputation                             Pertinent Vitals/Pain Pain Assessment: 0-10 Pain Score: 6  Pain Location: R stump with mobility Pain Descriptors / Indicators: Operative site guarding Pain Intervention(s): Limited activity within patient's tolerance;Monitored during session;Premedicated before session;Repositioned    Home Living Family/patient expects to be discharged to:: Private residence Living Arrangements: Children Available Help at Discharge: Family;Available 24 hours/day Type of Home: House Home Access: Stairs to enter Entrance Stairs-Rails: Left Entrance Stairs-Number of Steps: 1 Home Layout:  One level Home Equipment: [employer] - 2 wheels;Cane - single point;Bedside commode;Wheelchair - manual Additional Comments: pt has been home wiht family around the clock    Prior Function Level of Independence: Independent with assistive device(s)         Comments: cane or RW  depending on his abilities, has walked in last few days     Hand Dominance   Dominant Hand: Right    Extremity/Trunk Assessment   Upper Extremity Assessment Upper Extremity Assessment: Overall WFL for tasks assessed    Lower Extremity Assessment Lower Extremity Assessment: RLE deficits/detail RLE Deficits / Details: new BK amputation with NWB on RLE RLE Coordination: decreased gross motor    Cervical / Trunk Assessment Cervical / Trunk Assessment: Normal  Communication   Communication: No difficulties  Cognition Arousal/Alertness: Awake/alert Behavior During Therapy: WFL for tasks assessed/performed Overall Cognitive Status: Within Functional Limits for tasks assessed                                        General Comments General comments (skin integrity, edema, etc.): Pt has no shoe with him to accomodate the L transmet amp, so is at a disadvantage to balance and take steps    Exercises     Assessment/Plan    PT Assessment Patient needs continued PT services  PT Problem List Decreased strength;Decreased range of motion;Decreased activity tolerance;Decreased balance;Decreased mobility;Decreased coordination;Decreased knowledge of use of DME;Decreased safety awareness;Decreased skin integrity;Pain       PT Treatment Interventions DME instruction;Gait training;Stair training;Functional mobility training;Therapeutic activities;Therapeutic exercise;Balance training;Neuromuscular re-education;Patient/family education    PT Goals (Current goals can be found in the Care Plan section)  Acute Rehab PT Goals Patient Stated Goal: to get stronger and walk again PT Goal Formulation: With patient Time For Goal Achievement: 07/04/18 Potential to Achieve Goals: Good    Frequency Min 3X/week   Barriers to discharge Inaccessible home environment;Decreased caregiver support home with children but has to be able to transfer himself, and entrance is one step as well     Co-evaluation               AM-PAC PT "6 Clicks" Daily Activity  Outcome Measure Difficulty turning over in bed (including adjusting bedclothes, sheets and blankets)?: None Difficulty moving from lying on back to sitting on the side of the bed? : A Little Difficulty sitting down on and standing up from a chair with arms (e.g., wheelchair, bedside commode, etc,.)?: Unable Help needed moving to and from a bed to chair (including a wheelchair)?: A Little Help needed walking in hospital room?: A Little Help needed climbing 3-5 steps with a railing? : A Lot 6 Click Score: 16    End of Session Equipment Utilized During Treatment: Gait belt Activity Tolerance: Patient tolerated treatment well;Patient limited by fatigue Patient left: in bed;with call bell/phone within reach Nurse Communication: Mobility status PT Visit Diagnosis: Unsteadiness on feet (R26.81);Muscle weakness (generalized) (M62.81);Difficulty in walking, not elsewhere classified (R26.2);Pain Pain - Right/Left: Right Pain - part of body: Leg    Time: 1410-1443 PT Time Calculation (min) (ACUTE ONLY): 33 min   Charges:   PT Evaluation $PT Eval Moderate Complexity: 1 Mod PT Treatments $Therapeutic Activity: 8-22 mins       Ramond Dial 06/20/2018, 3:33 PM   Mee Hives, PT MS Acute Rehab Dept. Number: [place] [facility] [place]

## 2018-06-20 NOTE — Progress Notes (Signed)
PROGRESS NOTE  Edward [name] ZTI:[id number] DOB: 1971-05-21 DOA: 06/16/2018 PCP: [name], MD  Brief Narrative: 47 year old man PMH diabetes mellitus type 2 recently admitted for osteomyelitis right foot, status post fifth ray amputation, following with podiatrist as outpatient, readmitted for concerns of osteomyelitis right f foot.  Imaging revealed osteomyelitis of the fourth ray of the right foot and also the third ray.  Admitted for osteomyelitis of the right third and fourth ray right foot.  Underwent surgery, unfortunately foot was felt to be nonviable, orthopedics was consulted and the patient underwent transtibial amputation right leg 11/1.  Assessment/Plan Osteomyelitis right third, fourth ray right foot.  Status post amputation fourth ray right foot, surgery revealed wound tracking to calcaneus with minimal viable soft tissue on the plantar aspect of the foot. Status post transtibial amputation 11/1.   --Stop antibiotics today  Acute kidney injury with mild hyperkalemia, superimposed on CKD stage III. Lisinopril on hold --Renal function does appear to be very close to baseline, hyperkalemia may be related to surgery.  Asymptomatic.  Give IV fluids, repeat BMP in a.m.  Diabetes mellitus type 2, hemoglobin A1c 15.9 May 14, 2018. --CBG labile.. Increase insulin 70/30 twice daily, continue sliding scale insulin  Normocytic anemia, likely chronic disease. --Hemoglobin stable.   We will stop antibiotics today.  Give IV fluids and repeat BMP in a.m.  Physical therapy evaluation pending.  DVT prophylaxis: SCDs Code Status: Full Family Communication: none  Disposition Plan: pending PT evaluation   Murray Hodgkins, MD  [facility] Direct contact: [phone] --Via amion app OR  --[email or web address]; password [email or web address]  7PM-7AM contact night coverage as above 06/20/2018, 1:00 PM  LOS: 4 days   Consultants:  Podiatry  Orthopedics   Procedures:  AMPUTATION 4th RAY  Right Foot (Right)  Transtibial amputation right leg, Application of Prevena wound VAC  Antimicrobials:  Cefepime 10/29 >> 11/2  Vancomycin 10/29 >> 11/2  Interval history/Subjective: Feels okay, pain is fairly well controlled.  Objective: Vitals:  Vitals:   06/20/18 0643 06/20/18 1141  BP: (!) 141/94 (!) 152/94  Pulse: 100 (!) 105  Resp: 17 16  Temp: 97.8 F (36.6 C) 98.2 F (36.8 C)  SpO2: 100% 98%    Exam: Constitutional:   . Appears calm and comfortable Respiratory:  . CTA bilaterally, no w/r/r.  . Respiratory effort normal. Cardiovascular:  . RRR, no m/r/g Psychiatric:  . Mental status o Mood, affect appropriate  I have personally reviewed the following:   Data: . CBG labile 166-330 . Creatinine modestly increased, 1.77.  BUN elevated 29.  Potassium elevated 5.9. . WBC trending down, 11.5.  Hemoglobin stable 10.4.  Scheduled Meds: . docusate sodium  100 mg Oral BID  . feeding supplement (PRO-STAT SUGAR FREE 64)  30 mL Oral BID  . insulin aspart  0-9 Units Subcutaneous TID WC  . insulin aspart protamine- aspart  20 Units Subcutaneous BID WC  . multivitamin with minerals  1 tablet Oral Daily  . polyethylene glycol  17 g Oral BID  . senna  1 tablet Oral QHS   Continuous Infusions: . sodium chloride 10 mL/hr at 06/20/18 0400  . sodium chloride    . ceFEPime (MAXIPIME) IV 1 g (06/20/18 0555)  . lactated ringers 10 mL/hr at 06/17/18 0840  . lactated ringers 10 mL/hr at 06/19/18 1051  . methocarbamol (ROBAXIN) IV    . sodium chloride      Principal Problem:   Osteomyelitis of right foot (HCC) Active Problems:   DM2 (  diabetes mellitus, type 2) ([place])   AKI (acute kidney injury) ([place])   Normocytic anemia   Diabetic polyneuropathy associated with type 2 diabetes mellitus (HCC)   Severe protein-calorie malnutrition (HCC)   Hyperkalemia   LOS: 4 days

## 2018-06-20 NOTE — Progress Notes (Signed)
PT Cancellation Note  Patient Details Name: Edward [name] MRN: [id number] DOB: [birth year]   Cancelled Treatment:    Reason Eval/Treat Not Completed: Other (comment).  Declined as he is trying to get nausea under control and will try again later today.   Ramond Dial 06/20/2018, 10:37 AM   Mee Hives, PT MS Acute Rehab Dept. Number: [place] and [place]

## 2018-06-21 LAB — CULTURE, BLOOD (ROUTINE X 2)
Culture: NO GROWTH
Culture: NO GROWTH
SPECIAL REQUESTS: ADEQUATE

## 2018-06-21 LAB — BASIC METABOLIC PANEL
Anion gap: 10 (ref 5–15)
BUN: 22 mg/dL — ABNORMAL HIGH (ref 6–20)
CALCIUM: 8.2 mg/dL — AB (ref 8.9–10.3)
CHLORIDE: 107 mmol/L (ref 98–111)
CO2: 20 mmol/L — ABNORMAL LOW (ref 22–32)
Creatinine, Ser: 1.54 mg/dL — ABNORMAL HIGH (ref 0.61–1.24)
GFR calc Af Amer: 60 mL/min — ABNORMAL LOW (ref >60)
GFR calc non Af Amer: 52 mL/min — ABNORMAL LOW (ref >60)
Glucose, Bld: 126 mg/dL — ABNORMAL HIGH (ref 70–99)
POTASSIUM: 4.3 mmol/L (ref 3.5–5.1)
Sodium: 137 mmol/L (ref 135–145)

## 2018-06-21 LAB — GLUCOSE, CAPILLARY
GLUCOSE-CAPILLARY: 108 mg/dL — AB (ref 70–99)
GLUCOSE-CAPILLARY: 71 mg/dL (ref 70–99)
GLUCOSE-CAPILLARY: 184 mg/dL — AB (ref 70–99)
GLUCOSE-CAPILLARY: 78 mg/dL (ref 70–99)
Glucose-Capillary: 105 mg/dL — ABNORMAL HIGH (ref 70–99)

## 2018-06-21 MED ORDER — POLYETHYLENE GLYCOL 3350 17 G PO PACK
17.0000 g | PACK | ORAL | Status: AC
  Administered 2018-06-21 (×3): 17 g via ORAL
  Filled 2018-06-21 (×3): qty 1

## 2018-06-21 MED ORDER — BISACODYL 10 MG RE SUPP
10.0000 mg | Freq: Once | RECTAL | Status: AC
  Administered 2018-06-21: 10 mg via RECTAL
  Filled 2018-06-21: qty 1

## 2018-06-21 NOTE — Progress Notes (Signed)
Patient ID: Edward [name], male   DOB: 12-04-1970, 47 y.o.   MRN: [id number] Wound VAC without drainage.  Patient has the stump shrinker and limb protector in place.  Patient states he almost fell yesterday but the nurse was able to protect him and keep him from falling.  Anticipate discharge to inpatient or outpatient rehab.

## 2018-06-21 NOTE — Plan of Care (Signed)
  Problem: Activity: Goal: Risk for activity intolerance will decrease Outcome: Progressing   Problem: Coping: Goal: Level of anxiety will decrease Outcome: Progressing   Problem: Pain Managment: Goal: General experience of comfort will improve Outcome: Progressing   Problem: Safety: Goal: Ability to remain free from injury will improve Outcome: Progressing   Problem: Skin Integrity: Goal: Risk for impaired skin integrity will decrease Outcome: Progressing   

## 2018-06-21 NOTE — Progress Notes (Signed)
Physical Therapy Treatment Patient Details Name: Edward [name] MRN: [id number] DOB: Jul 10, 1971 Today's Date: 06/21/2018    History of Present Illness 47 y/o male admitted secondary to R foot osteomyelitis. Pt was/p R partial 5th ray amputation but now is BK amputee with surgery 06/19/18.  Has AKI, nausea and has protein and calorie malnutrition.  PMH includes DM, HTN, and L transmet amputation, anemia, CKD 3,     PT Comments    Continuing work on functional mobility and activity tolerance;  Session focused on therapeutic exercise and transfer training; Very motivated, and was able to get prone for hip extension exercise (doesn't often happen in acute care!); He is still very nervous about standing, steps and transfers -- he describes a near fall (great nursing assist prevented the fall) when he lost his balance and instinctively tried to stand on a foot that was not there -- this has shaken his confidence;   Took time to discuss dc options and typical rehab course; Edward [name] initially voiced a preference for going home, but is now giving post-acute rehab real consideration; I believe he will be a very good rehab candidate for CIR (will place screen); Also placed OT order per protocol -- with their focus on ADLs, that can help with dc planning as well  Follow Up Recommendations  CIR     Equipment Recommendations  Rolling walker with 5" wheels;3in1 (PT);Wheelchair (measurements PT);Wheelchair cushion (measurements PT)    Recommendations for Other Services OT consult(will order per protocol)     Precautions / Restrictions Precautions Precautions: Fall Restrictions RLE Weight Bearing: Non weight bearing    Mobility  Bed Mobility Overal bed mobility: Needs Assistance Bed Mobility: Rolling;Supine to Sit Rolling: Supervision   Supine to sit: Min assist     General bed mobility comments: Performed rolling all the way to prone and back with cues for positioning and use of rail prn; min  handheld assist to pull to sit  Transfers Overall transfer level: Needs assistance Equipment used: Rolling walker (2 wheeled) Transfers: Sit to/from [facility] Sit to Stand: Mod assist;Min assist Stand pivot transfers: Min assist       General transfer comment: mod assist to power up then min assist to steady; practiced sit to stand twice, then stood and pivoted to recliner on pt's L side using RW  Ambulation/Gait                 Stairs             Wheelchair Mobility    Modified Rankin (Stroke Patients Only)       Balance     Sitting balance-Leahy Scale: Fair       Standing balance-Leahy Scale: Poor                              Cognition Arousal/Alertness: Awake/alert Behavior During Therapy: WFL for tasks assessed/performed Overall Cognitive Status: Within Functional Limits for tasks assessed                                        Exercises Amputee Exercises Quad Sets: AROM;Right;15 reps Gluteal Sets: AROM;Both;10 reps Hip Extension: AROM;Right;Left;10 reps;Prone(alternating) Hip ABduction/ADduction: AROM;Right;10 reps;Sidelying Knee Flexion: AROM;10 reps;Seated;Right Knee Extension: AROM;Right;10 reps;Seated Straight Leg Raises: AROM;Right;10 reps    General Comments        Pertinent Vitals/Pain  Pain Assessment: 0-10 Pain Score: 4  Pain Location: R residual limb with mobility and therex Pain Descriptors / Indicators: Aching Pain Intervention(s): Monitored during session    Home Living                      Prior Function            PT Goals (current goals can now be found in the care plan section) Acute Rehab PT Goals Patient Stated Goal: to get stronger and walk again PT Goal Formulation: With patient Time For Goal Achievement: 07/04/18 Potential to Achieve Goals: Good Progress towards PT goals: Progressing toward goals    Frequency    Min 3X/week      PT Plan  Current plan remains appropriate    Co-evaluation              AM-PAC PT "6 Clicks" Daily Activity  Outcome Measure  Difficulty turning over in bed (including adjusting bedclothes, sheets and blankets)?: A Little Difficulty moving from lying on back to sitting on the side of the bed? : A Little Difficulty sitting down on and standing up from a chair with arms (e.g., wheelchair, bedside commode, etc,.)?: Unable Help needed moving to and from a bed to chair (including a wheelchair)?: A Little Help needed walking in hospital room?: A Lot Help needed climbing 3-5 steps with a railing? : A Lot 6 Click Score: 14    End of Session Equipment Utilized During Treatment: Gait belt Activity Tolerance: Patient tolerated treatment well Patient left: in chair;with call bell/phone within reach Nurse Communication: Mobility status PT Visit Diagnosis: Unsteadiness on feet (R26.81);Muscle weakness (generalized) (M62.81);Difficulty in walking, not elsewhere classified (R26.2);Pain Pain - Right/Left: Right Pain - part of body: Leg     Time: 1735-6701 PT Time Calculation (min) (ACUTE ONLY): 43 min  Charges:  $Therapeutic Exercise: 8-22 mins $Therapeutic Activity: 23-37 mins                     Roney Marion, PT  Acute Rehabilitation Services Pager [phone] Office [phone]    [place] 06/21/2018, 12:57 PM

## 2018-06-21 NOTE — Progress Notes (Addendum)
PROGRESS NOTE  Edward [name] FAO:[id number] DOB: 04-21-1971 DOA: 06/16/2018 PCP: [name], MD  Brief Narrative: 47 year old man PMH diabetes mellitus type 2 recently admitted for osteomyelitis right foot, status post fifth ray amputation, following with podiatrist as outpatient, readmitted for concerns of osteomyelitis right f foot.  Imaging revealed osteomyelitis of the fourth ray of the right foot and also the third ray.  Admitted for osteomyelitis of the right third and fourth ray right foot.  Underwent surgery, unfortunately foot was felt to be nonviable, orthopedics was consulted and the patient underwent transtibial amputation right leg 11/1.  Assessment/Plan Osteomyelitis right third, fourth ray right foot.  Status post amputation fourth ray right foot, surgery revealed wound tracking to calcaneus with minimal viable soft tissue on the plantar aspect of the foot. Status post transtibial amputation 11/1.   --Continue physical therapy  Acute kidney injury with mild hyperkalemia, superimposed on CKD stage III. Lisinopril on hold --Renal function improving, appears to be at baseline.  Diabetes mellitus type 2, hemoglobin A1c 15.9 May 14, 2018. --CBG control improved.  Continue  insulin 70/30 twice daily at current dose, continue sliding scale insulin  Normocytic anemia, likely chronic disease. --Hemoglobin stable.  Constipation. --Aggressive bowel regimen.   Stable pending PT evaluation, will need rehab  DVT prophylaxis: SCDs Code Status: Full Family Communication: none  Disposition Plan: Rehab hopefully 11/4   Murray Hodgkins, MD  [facility] Direct contact: [phone] --Via amion app OR  --[email or web address]; password [email or web address]  7PM-7AM contact night coverage as above 06/21/2018, 12:41 PM  LOS: 5 days   Consultants:  Podiatry  Orthopedics   Procedures:  AMPUTATION 4th RAY Right Foot (Right)  Transtibial amputation right leg, Application of Prevena  wound VAC  Antimicrobials:  Cefepime 10/29 >> 11/2  Vancomycin 10/29 >> 11/2  Interval history/Subjective: Pain well controlled.  Bowels have not moved today.  Objective: Vitals:  Vitals:   06/20/18 2025 06/21/18 0501  BP: (!) 150/95 (!) 152/101  Pulse: (!) 101 99  Resp: 16 16  Temp: 98.2 F (36.8 C) 97.7 F (36.5 C)  SpO2: 98% 99%    Exam: Constitutional:   . Appears calm and comfortable Respiratory:  . CTA bilaterally, no w/r/r.  . Respiratory effort normal.  Cardiovascular:  . RRR, no m/r/g Psychiatric:  . Mental status o Mood, affect appropriate  I have personally reviewed the following:   Data: . CBG labile 76-330 . Creatinine trending down, 1.54, BUN trending down 22, potassium is normalized 4.3.  Scheduled Meds: . bisacodyl  10 mg Rectal Once  . feeding supplement (PRO-STAT SUGAR FREE 64)  30 mL Oral BID  . insulin aspart  0-9 Units Subcutaneous TID WC  . insulin aspart protamine- aspart  20 Units Subcutaneous BID WC  . multivitamin with minerals  1 tablet Oral Daily  . polyethylene glycol  17 g Oral Q4H  . senna  1 tablet Oral QHS   Continuous Infusions: . sodium chloride Stopped (06/20/18 1336)  . sodium chloride 75 mL/hr at 06/21/18 1231  . lactated ringers 10 mL/hr at 06/17/18 0840  . lactated ringers 10 mL/hr at 06/19/18 1051  . methocarbamol (ROBAXIN) IV      Principal Problem:   Osteomyelitis of right foot (HCC) Active Problems:   DM2 (diabetes mellitus, type 2) (HCC)   AKI (acute kidney injury) ([place])   Normocytic anemia   Diabetic polyneuropathy associated with type 2 diabetes mellitus (HCC)   Severe protein-calorie malnutrition (HCC)   Hyperkalemia   LOS: 5  days

## 2018-06-22 ENCOUNTER — Encounter (HOSPITAL_COMMUNITY): Payer: Self-pay

## 2018-06-22 ENCOUNTER — Inpatient Hospital Stay (HOSPITAL_COMMUNITY)
Admission: RE | Admit: 2018-06-22 | Discharge: 2018-07-01 | DRG: 560 | Disposition: A | Discharge status: Home Health | Payer: Medicaid Other | Source: Intra-hospital | Attending: Physical Medicine & Rehabilitation | Admitting: Physical Medicine & Rehabilitation

## 2018-06-22 ENCOUNTER — Ambulatory Visit: Payer: Medicaid Other | Admitting: Internal Medicine

## 2018-06-22 DIAGNOSIS — Z720 Tobacco use: Secondary | ICD-10-CM

## 2018-06-22 DIAGNOSIS — F17210 Nicotine dependence, cigarettes, uncomplicated: Secondary | ICD-10-CM | POA: Diagnosis present

## 2018-06-22 DIAGNOSIS — E8809 Other disorders of plasma-protein metabolism, not elsewhere classified: Secondary | ICD-10-CM | POA: Diagnosis present

## 2018-06-22 DIAGNOSIS — E1122 Type 2 diabetes mellitus with diabetic chronic kidney disease: Secondary | ICD-10-CM | POA: Diagnosis present

## 2018-06-22 DIAGNOSIS — N183 Chronic kidney disease, stage 3 (moderate): Secondary | ICD-10-CM | POA: Diagnosis present

## 2018-06-22 DIAGNOSIS — Z794 Long term (current) use of insulin: Secondary | ICD-10-CM

## 2018-06-22 DIAGNOSIS — F418 Other specified anxiety disorders: Secondary | ICD-10-CM | POA: Diagnosis not present

## 2018-06-22 DIAGNOSIS — D631 Anemia in chronic kidney disease: Secondary | ICD-10-CM | POA: Diagnosis present

## 2018-06-22 DIAGNOSIS — Z4781 Encounter for orthopedic aftercare following surgical amputation: Secondary | ICD-10-CM | POA: Diagnosis present

## 2018-06-22 DIAGNOSIS — I10 Essential (primary) hypertension: Secondary | ICD-10-CM | POA: Diagnosis not present

## 2018-06-22 DIAGNOSIS — D62 Acute posthemorrhagic anemia: Secondary | ICD-10-CM

## 2018-06-22 DIAGNOSIS — Z96642 Presence of left artificial hip joint: Secondary | ICD-10-CM | POA: Diagnosis present

## 2018-06-22 DIAGNOSIS — D72829 Elevated white blood cell count, unspecified: Secondary | ICD-10-CM

## 2018-06-22 DIAGNOSIS — I129 Hypertensive chronic kidney disease with stage 1 through stage 4 chronic kidney disease, or unspecified chronic kidney disease: Secondary | ICD-10-CM | POA: Diagnosis present

## 2018-06-22 DIAGNOSIS — Z89422 Acquired absence of other left toe(s): Secondary | ICD-10-CM | POA: Diagnosis not present

## 2018-06-22 DIAGNOSIS — Z89511 Acquired absence of right leg below knee: Secondary | ICD-10-CM | POA: Diagnosis not present

## 2018-06-22 DIAGNOSIS — R7309 Other abnormal glucose: Secondary | ICD-10-CM | POA: Diagnosis not present

## 2018-06-22 DIAGNOSIS — Z79891 Long term (current) use of opiate analgesic: Secondary | ICD-10-CM | POA: Diagnosis not present

## 2018-06-22 DIAGNOSIS — Z89432 Acquired absence of left foot: Secondary | ICD-10-CM

## 2018-06-22 DIAGNOSIS — F419 Anxiety disorder, unspecified: Secondary | ICD-10-CM | POA: Diagnosis present

## 2018-06-22 DIAGNOSIS — E46 Unspecified protein-calorie malnutrition: Secondary | ICD-10-CM

## 2018-06-22 DIAGNOSIS — Z833 Family history of diabetes mellitus: Secondary | ICD-10-CM

## 2018-06-22 DIAGNOSIS — Z79899 Other long term (current) drug therapy: Secondary | ICD-10-CM

## 2018-06-22 DIAGNOSIS — F329 Major depressive disorder, single episode, unspecified: Secondary | ICD-10-CM | POA: Diagnosis present

## 2018-06-22 DIAGNOSIS — E1142 Type 2 diabetes mellitus with diabetic polyneuropathy: Secondary | ICD-10-CM

## 2018-06-22 DIAGNOSIS — G8918 Other acute postprocedural pain: Secondary | ICD-10-CM

## 2018-06-22 DIAGNOSIS — S88111A Complete traumatic amputation at level between knee and ankle, right lower leg, initial encounter: Secondary | ICD-10-CM

## 2018-06-22 DIAGNOSIS — E1165 Type 2 diabetes mellitus with hyperglycemia: Secondary | ICD-10-CM

## 2018-06-22 DIAGNOSIS — K5900 Constipation, unspecified: Secondary | ICD-10-CM | POA: Diagnosis present

## 2018-06-22 DIAGNOSIS — E162 Hypoglycemia, unspecified: Secondary | ICD-10-CM

## 2018-06-22 DIAGNOSIS — D638 Anemia in other chronic diseases classified elsewhere: Secondary | ICD-10-CM

## 2018-06-22 LAB — GLUCOSE, CAPILLARY
GLUCOSE-CAPILLARY: 121 mg/dL — AB (ref 70–99)
Glucose-Capillary: 159 mg/dL — ABNORMAL HIGH (ref 70–99)
Glucose-Capillary: 140 mg/dL — ABNORMAL HIGH (ref 70–99)
Glucose-Capillary: 143 mg/dL — ABNORMAL HIGH (ref 70–99)
Glucose-Capillary: 93 mg/dL (ref 70–99)

## 2018-06-22 LAB — AEROBIC/ANAEROBIC CULTURE W GRAM STAIN (SURGICAL/DEEP WOUND)

## 2018-06-22 LAB — AEROBIC/ANAEROBIC CULTURE (SURGICAL/DEEP WOUND)

## 2018-06-22 MED ORDER — INSULIN ASPART 100 UNIT/ML ~~LOC~~ SOLN
0.0000 [IU] | Freq: Three times a day (TID) | SUBCUTANEOUS | Status: DC
  Administered 2018-06-23: 3 [IU] via SUBCUTANEOUS
  Administered 2018-06-23 – 2018-06-24 (×3): 1 [IU] via SUBCUTANEOUS
  Administered 2018-06-24 – 2018-06-25 (×2): 2 [IU] via SUBCUTANEOUS
  Administered 2018-06-27: 7 [IU] via SUBCUTANEOUS
  Administered 2018-06-27 – 2018-06-28 (×2): 2 [IU] via SUBCUTANEOUS
  Administered 2018-06-29 – 2018-06-30 (×2): 1 [IU] via SUBCUTANEOUS

## 2018-06-22 MED ORDER — ONDANSETRON HCL 4 MG PO TABS: 4.0000 mg | ORAL_TABLET | Freq: Four times a day (QID) | ORAL | Status: DC | PRN

## 2018-06-22 MED ORDER — METHOCARBAMOL 1000 MG/10ML IJ SOLN
500.0000 mg | Freq: Four times a day (QID) | INTRAVENOUS | Status: DC | PRN
  Filled 2018-06-22: qty 5

## 2018-06-22 MED ORDER — OXYCODONE HCL 5 MG PO TABS
5.0000 mg | ORAL_TABLET | ORAL | Status: DC | PRN
  Administered 2018-06-23 – 2018-06-30 (×14): 10 mg via ORAL
  Administered 2018-06-30: 5 mg via ORAL
  Administered 2018-07-01: 10 mg via ORAL
  Filled 2018-06-22 (×2): qty 2
  Filled 2018-06-22: qty 1
  Filled 2018-06-22 (×14): qty 2

## 2018-06-22 MED ORDER — POLYETHYLENE GLYCOL 3350 17 G PO PACK
17.0000 g | PACK | Freq: Every day | ORAL | Status: DC
  Administered 2018-06-22 – 2018-07-01 (×9): 17 g via ORAL
  Filled 2018-06-22 (×10): qty 1

## 2018-06-22 MED ORDER — PRO-STAT SUGAR FREE PO LIQD
30.0000 mL | Freq: Two times a day (BID) | ORAL | Status: DC
  Administered 2018-06-23 – 2018-06-30 (×16): 30 mL via ORAL
  Filled 2018-06-22 (×16): qty 30

## 2018-06-22 MED ORDER — ONDANSETRON HCL 4 MG/2ML IJ SOLN: 4.0000 mg | Freq: Four times a day (QID) | INTRAMUSCULAR | Status: DC | PRN

## 2018-06-22 MED ORDER — INSULIN ASPART PROT & ASPART (70-30 MIX) 100 UNIT/ML ~~LOC~~ SUSP
20.0000 [IU] | Freq: Two times a day (BID) | SUBCUTANEOUS | Status: DC
  Administered 2018-06-23 – 2018-06-28 (×11): 20 [IU] via SUBCUTANEOUS
  Filled 2018-06-22 (×2): qty 10

## 2018-06-22 MED ORDER — ACETAMINOPHEN 325 MG PO TABS: 325.0000 mg | ORAL_TABLET | Freq: Four times a day (QID) | ORAL | Status: DC | PRN

## 2018-06-22 MED ORDER — SORBITOL 70 % SOLN: 30.0000 mL | Freq: Every day | Status: DC | PRN

## 2018-06-22 MED ORDER — SENNA 8.6 MG PO TABS
1.0000 | ORAL_TABLET | Freq: Every day | ORAL | Status: DC
  Administered 2018-06-22 – 2018-06-30 (×9): 8.6 mg via ORAL
  Filled 2018-06-22 (×9): qty 1

## 2018-06-22 MED ORDER — ADULT MULTIVITAMIN W/MINERALS CH
1.0000 | ORAL_TABLET | Freq: Every day | ORAL | Status: DC
  Administered 2018-06-23 – 2018-07-01 (×9): 1 via ORAL
  Filled 2018-06-22 (×9): qty 1

## 2018-06-22 MED ORDER — METHOCARBAMOL 500 MG PO TABS
500.0000 mg | ORAL_TABLET | Freq: Four times a day (QID) | ORAL | Status: DC | PRN
  Administered 2018-06-24 – 2018-06-30 (×7): 500 mg via ORAL
  Filled 2018-06-22 (×7): qty 1

## 2018-06-22 NOTE — IPOC Note (Signed)
Overall Plan of Care [facility]) Patient Details Name: Edward [name] MRN: [id number] DOB: [birth year]  Admitting Diagnosis: Right BKA with history of left transmetatarsal  Hospital Problems: Active Problems:   Right below-knee amputee [facility])   History of transmetatarsal amputation of left foot (HCC)   Hypoalbuminemia due to protein-calorie malnutrition ([place])   Stage 3 chronic kidney disease ([place])   Poorly controlled type 2 diabetes mellitus with peripheral neuropathy (HCC)   Unilateral complete BKA, right, subsequent encounter ([place])     Functional Problem List: Nursing Bowel, Pain  PT Balance, Endurance, Motor, Safety, Pain  OT Balance, Endurance, Motor, Pain, Safety, Skin Integrity  SLP    TR         Basic ADL's: OT Grooming, Bathing, Dressing, Toileting     Advanced  ADL's: OT Simple Meal Preparation     Transfers: PT Bed to Chair, Car, Furniture, [facility] Mobility, Floor  OT Toilet, Tub/Shower     Locomotion: PT Ambulation, Emergency planning/management officer, Stairs     Additional Impairments: OT None  SLP        TR      Anticipated Outcomes Item Anticipated Outcome  Self Feeding    Swallowing      Basic self-care  Mod I  Toileting  Mod I   Bathroom Transfers Mod I - Supervision shower transfer  Bowel/Bladder  Pt will be continent x 2 while in rehab. LBM 06/19/2018, pt reports this is his normal  Transfers  modI  Locomotion  modI  Communication     Cognition     Pain  pt will report pain of less than 2  Safety/Judgment  pt will remain free from falls while in rehab   Therapy Plan: PT Intensity: Minimum of 1-2 x/day ,45 to 90 minutes PT Frequency: 5 out of 7 days PT Duration Estimated Length of Stay: 10-12 days OT Intensity: Minimum of 1-2 x/day, 45 to 90 minutes OT Frequency: 5 out of 7 days OT Duration/Estimated Length of Stay: 10-12 days      Team Interventions: Nursing Interventions Patient/Family Education, Disease Management/Prevention, Skin Care/Wound  Management, Discharge Planning, Pain Management, Bowel Management  PT interventions Ambulation/gait training, Discharge planning, DME/adaptive equipment instruction, Functional mobility training, Pain management, Psychosocial support, Splinting/orthotics, Therapeutic Activities, UE/LE Strength taining/ROM, Training and development officer, Community reintegration, Disease management/prevention, Neuromuscular re-education, Patient/family education, Skin care/wound management, IT trainer, Therapeutic Exercise, UE/LE Coordination activities, Wheelchair propulsion/positioning  OT Interventions Training and development officer, Academic librarian, Discharge planning, Disease mangement/prevention, Engineer, drilling, Functional mobility training, Pain management, Patient/family education, Psychosocial support, Self Care/advanced ADL retraining, Skin care/wound managment, Splinting/orthotics, Therapeutic Activities, Therapeutic Exercise, UE/LE Strength taining/ROM, Wheelchair propulsion/positioning  SLP Interventions    TR Interventions    SW/CM Interventions Discharge Planning, Psychosocial Support, Patient/Family Education   Barriers to Discharge MD  Medical stability and Weight bearing restrictions  Nursing Wound Care dressing changes  PT Weight bearing restrictions R LE NWB  OT      SLP      SW       Team Discharge Planning: Destination: PT-Home ,OT- Home , SLP-  Projected Follow-up: PT-Home health PT, OT-  Home health OT, SLP-  Projected Equipment Needs: PT-Other (comment)(R amputee pad), OT- Tub/shower seat, Other (comment)(drop arm BSC), SLP-  Equipment Details: PT-has w/c, RW, rollator, OT-  Patient/family involved in discharge planning: PT- Patient,  OT-Patient, SLP-   MD ELOS: 8-11 days. Medical Rehab Prognosis:  Good Assessment: 47 year old right-handed male history of type 2 diabetes mellitus, chronic anemia, hypertension, tobacco abuse, left  transmetatarsal  amputation 05/10/2016.  Presented 06/16/2018 with right foot infection with podiatry follow-up and underwent right fourth ray amputation 06/17/2018 per Dr. Carman Ching.  Postoperatively with extensive infection requiring debridement of which limb could not be salvaged and underwent right transtibial amputation 06/19/2018 per Dr. Sharol Given.  Wound VAC applied.  Hospital course pain management.  [employer] consulted for pre-prosthetic training.  Acute blood loss anemia monitored.  Patient with resulting functional deficits with mobility, self-care, and transfers.  Will set goals for Mod I with PT/OT.   See Team Conference Notes for weekly updates to the plan of care

## 2018-06-22 NOTE — Progress Notes (Addendum)
Report called to Santiago Glad on CIR. Patient would like to eat his dinner that has just arrived and will transfer after. All belongings packed and sent with patient. RN aware that patient has a right wound vac as well.   Patient transferred to [id number] with SWOT RN and NT. 70/30 insulin sent with patient per RN request. Patient was given PO pain medication and insulin per order before he left.

## 2018-06-22 NOTE — Progress Notes (Signed)
Edward Gong, RN  Rehab Admission Coordinator  Physical Medicine and Rehabilitation  PMR Pre-admission  Signed  Date of Service:  06/22/2018 2:46 PM       Related encounter: ED to Hosp-Admission (Current) from 06/16/2018 in [facility]         Show:Clear all [x] Manual[x] Template[x] Copied  Added by: [x] Edward Gong, RN  [] Hover for details PMR Admission Coordinator Pre-Admission Assessment  Patient: Edward [name] is an 47 y.o., male MRN: [id number] DOB: May 18, 1971 Height: 6' 0.99" (185.4 cm) Weight: 98.8 kg                                                                                                                                                  Insurance Information HMO:     PPO:      PCP:      IPA:      80/20:      OTHER:  PRIMARY: Medicaid [place]      Policy#: [id number]      Subscriber: pt Benefits:  Phone #: [phone]     Name: 06/22/2018 Eff. Date: active 06/22/2018    MADMN  Medicaid Application Date:       Case Manager:  Disability Application Date:       Case Worker:   Emergency Contact Information         Contact Information    Name Relation Home Work [place], [place] Son   [phone]   REFOEL, PALLADINO [phone]       Current Medical History  Patient Admitting Diagnosis: right BKA  History of Present Illness: Jamie L. Bowersis a 47 year old right-handed male history of type 2 diabetes mellitus, chronic anemia, hypertension, tobacco abuse, left transmetatarsal amputation 05/10/2016.  Presented 06/16/2018 with right foot infection with podiatry follow-up and underwent right fourth ray amputation 06/17/2018 per Dr. Carman Ching. Postoperatively with extensive infection requiring debridement of which limb could not be salvaged and underwent right transtibial amputation 06/19/2018 per Dr. Sharol Given. Wound VAC applied. Hospital course pain management. [employer]  consulted for pre-prosthetic training. Acute blood loss anemia 10.4.    Past Medical History      Past Medical History:  Diagnosis Date  . Anxiety    self reported  . Arthritis   . Depression    self reported  . Diabetes mellitus   . Hypertension     Family History  family history includes Diabetes in his other.  Prior Rehab/Hospitalizations:  Has the patient had major surgery during 100 days prior to admission? Yes  Current Medications   Current Facility-Administered Medications:  .  0.9 %  sodium chloride infusion, , Intravenous, Continuous, [name], MD, Stopped at 06/20/18 1336 .  acetaminophen (TYLENOL) tablet 325-650 mg, 325-650 mg, Oral, Q6H PRN, [name],  MD .  alum & mag hydroxide-simeth (MAALOX/MYLANTA) 200-200-20 MG/5ML suspension 30 mL, 30 mL, Oral, Q4H PRN, Samuella Cota, MD, 30 mL at 06/20/18 1340 .  feeding supplement (PRO-STAT SUGAR FREE 64) liquid 30 mL, 30 mL, Oral, BID, [name], MD, 30 mL at 06/22/18 1300 .  HYDROmorphone (DILAUDID) injection 0.5-1 mg, 0.5-1 mg, Intravenous, Q4H PRN, [name], MD, 1 mg at 06/21/18 0950 .  insulin aspart (novoLOG) injection 0-9 Units, 0-9 Units, Subcutaneous, TID WC, [name], MD, 1 Units at 06/22/18 1306 .  insulin aspart protamine- aspart (NOVOLOG MIX 70/30) injection 20 Units, 20 Units, Subcutaneous, BID WC, Samuella Cota, MD, 20 Units at 06/22/18 [phone] .  lactated ringers infusion, , Intravenous, Continuous, [name], MD, Last Rate: 10 mL/hr at 06/17/18 0840 .  lactated ringers infusion, , Intravenous, Continuous, [name], MD, Last Rate: 10 mL/hr at 06/19/18 1051 .  methocarbamol (ROBAXIN) tablet 500 mg, 500 mg, Oral, Q6H PRN, 500 mg at 06/21/18 0006 **OR** methocarbamol (ROBAXIN) 500 mg in dextrose 5 % 50 mL IVPB, 500 mg, Intravenous, Q6H PRN, [name], MD .  multivitamin with minerals tablet 1 tablet, 1 tablet, Oral, Daily, [name], MD, 1 tablet  at 06/22/18 [phone] .  ondansetron (ZOFRAN) tablet 4 mg, 4 mg, Oral, Q6H PRN **OR** ondansetron (ZOFRAN) injection 4 mg, 4 mg, Intravenous, Q6H PRN, [name], MD, 4 mg at 06/20/18 1032 .  oxyCODONE (Oxy IR/ROXICODONE) immediate release tablet 10-15 mg, 10-15 mg, Oral, Q4H PRN, [name], MD, 10 mg at 06/22/18 0951 .  oxyCODONE (Oxy IR/ROXICODONE) immediate release tablet 5-10 mg, 5-10 mg, Oral, Q4H PRN, [name], MD, 10 mg at 06/21/18 1654 .  senna (SENOKOT) tablet 8.6 mg, 1 tablet, Oral, QHS, [name], MD, 8.6 mg at 06/21/18 2133  Patients Current Diet:     Diet Order                  Diet Carb Modified Fluid consistency: Thin; Room service appropriate? Yes  Diet effective now               Precautions / Restrictions Precautions Precautions: Fall Restrictions Weight Bearing Restrictions: Yes RLE Weight Bearing: Non weight bearing Other Position/Activity Restrictions: will be receiving positioning splint from [employer]   Has the patient had 2 or more falls or a fall with injury in the past year?No Had a near fall since admit, therefore needs reassurance and he has a fear of falling Prior Activity Level Limited Community (1-2x/wk): limited since most recent surgery pta  [place] Devices/Equipment: Radio producer (specify quad or straight) Home Equipment: Walker - 2 wheels, Cane - single point, Bedside commode, Wheelchair - manual  Prior Device Use: Indicate devices/aids used by the patient prior to current illness, exacerbation or injury? Walker  Prior Functional Level Prior Function Level of Independence: Independent with assistive device(s) Comments: cane or RW depending on his abilities, has walked in last few days  Self Care: Did the patient need help bathing, dressing, using the toilet or eating?  Independent  Indoor Mobility: Did the patient need assistance with walking from room to room (with or without  device)? Independent  Stairs: Did the patient need assistance with internal or external stairs (with or without device)? Needed some help  Functional Cognition: Did the patient need help planning regular tasks such as shopping or remembering to take medications? Independent  Current Functional  Level Cognition  Overall Cognitive Status: Within Functional Limits for tasks assessed Orientation Level: Oriented X4    Extremity Assessment (includes Sensation/Coordination)  Upper Extremity Assessment: Overall WFL for tasks assessed  Lower Extremity Assessment: RLE deficits/detail RLE Deficits / Details: new BK amputation with NWB on RLE RLE Coordination: decreased gross motor    ADLs       Mobility  Overal bed mobility: Needs Assistance Bed Mobility: Rolling, Supine to Sit Rolling: Supervision Supine to sit: Min assist Sit to supine: Min guard General bed mobility comments: Performed rolling all the way to prone and back with cues for positioning and use of rail prn; min handheld assist to pull to sit    Transfers  Overall transfer level: Needs assistance Equipment used: Rolling walker (2 wheeled) Transfers: Sit to/from Stand, [employer] Transfers Sit to Stand: Mod assist, Min assist Stand pivot transfers: Min assist General transfer comment: mod assist to power up then min assist to steady; practiced sit to stand twice, then stood and pivoted to recliner on pt's L side using RW    Ambulation / Gait / Stairs / Wheelchair Mobility  Ambulation/Gait Ambulation/Gait assistance: Herbalist (Feet): 4 Feet Assistive device: Rolling walker (2 wheeled), 1 person hand held assist General Gait Details: pt stood side of bed and was unsteady so practiced mult times to get side shift of LLE on RW with min assist, heavy reliance on RW Gait velocity: reduced Gait velocity interpretation: <1.8 ft/sec, indicate of risk for recurrent falls    Posture / Balance Dynamic  Sitting Balance Sitting balance - Comments: improved to good after practicing Balance Overall balance assessment: Needs assistance Sitting-balance support: Bilateral upper extremity supported, Single extremity supported Sitting balance-Leahy Scale: Fair Sitting balance - Comments: improved to good after practicing Standing balance support: Bilateral upper extremity supported, During functional activity Standing balance-Leahy Scale: Poor Standing balance comment: working to control balance on RW with LLE which has transmet amputation    Special needs/care consideration BiPAP/CPAP n/a CPM n/a Continuous Drip IV n/a Dialysis n/a Life Vest n/a Oxygen n/a Special Bed n/a Trach Size n/a Wound Vac right BKA surgical site with wound VAC Skin right BKA surgical site Bowel mgmt: no LBM noted Bladder mgmt: continent Diabetic mgmt Hgb A1c 9/10 was 15.9   Previous Home Environment Living Arrangements: (his two sons who are 9 and 46 years old live with him)  Lives With: (sons) Available Help at Discharge: Family, Available 24 hours/day Type of Home: House Home Layout: One level Home Access: Stairs to enter Entrance Stairs-Rails: Left Entrance Stairs-Number of Steps: 1 Bathroom Shower/Tub: Multimedia programmer: Standard Bathroom Accessibility: Yes How Accessible: Accessible via walker Home Care Services: Yes Type of Home Care Services: [place] (if known): [facility] Additional Comments: pt has been home wiht family around the clock  Discharge Living Setting Plans for Discharge Living Setting: Patient's home, Lives with (comment)(pt has 2 sons who live with him) Type of Home at Discharge: House Discharge Home Layout: One level Discharge Home Access: Stairs to enter Entrance Stairs-Rails: Left Entrance Stairs-Number of Steps: 1 Discharge Bathroom Shower/Tub: Walk-in shower Discharge Bathroom Toilet: Standard Discharge Bathroom Accessibility:  Yes How Accessible: Accessible via walker Does the patient have any problems obtaining your medications?: No  Social/Family/Support Systems Patient Roles: Parent Contact Information: sons Anticipated Caregiver: sons Anticipated Caregiver's Contact Information: see above Ability/Limitations of Caregiver: [name] works nights and Jamal works days Caregiver Availability: 24/7 Discharge Plan Discussed with  Primary Caregiver: Yes Is Caregiver In Agreement with Plan?: Yes Does Caregiver/Family have Issues with Lodging/Transportation while Pt is in Rehab?: No  Goals/Additional Needs Patient/Family Goal for Rehab: Mod I to supervision with PT and OT Expected length of stay: ELOS 8 to 12 days Pt/Family Agrees to Admission and willing to participate: Yes Program Orientation Provided & Reviewed with Pt/Caregiver Including Roles  & Responsibilities: Yes  Decrease burden of Care through IP rehab admission: n/a  Possible need for SNF placement upon discharge: not anticipated  Patient Condition: This patient's condition remains as documented in the consult dated 06/22/2018, in which the Rehabilitation Physician determined and documented that the patient's condition is appropriate for intensive rehabilitative care in an inpatient rehabilitation facility. Will admit to inpatient rehab today.  Preadmission Screen Completed By:  Cleatrice Burke, 06/22/2018 2:46 PM ______________________________________________________________________   Discussed status with Dr. Posey Pronto on 06/22/2018 at  1451 and received telephone approval for admission today.  Admission Coordinator:  Cleatrice Burke, time 5750 Date 06/22/2018           Cosigned by: [name], MD at 06/22/2018 2:53 PM  Revision History

## 2018-06-22 NOTE — Discharge Summary (Signed)
Physician Discharge Summary  DEWARD SEBEK SFK:[id number] DOB: 07-27-1971 DOA: 06/16/2018  PCP: [name], MD  Admit date: 06/16/2018 Discharge date: 06/22/2018  Recommendations for Outpatient Follow-up:  1. Postoperative care per Dr. Sharol Given.  Please contact his office for further instructions in regard to postoperative care.  Follow-up Information    [name], MD In 1 week.   Specialty:  Orthopedic Surgery Contact information: [place] [address] [phone]            Discharge Diagnoses:  1. Osteomyelitis right third, fourth ray right foot.   2. Acute kidney injury with mild hyperkalemia, superimposed on CKD stage III 3. Diabetes mellitus type 2 4. Normocytic anemia, likely chronic disease. 5. Constipation.  Discharge Condition: improved Disposition: CIR  Diet recommendation: diabetic diet  Filed Weights   06/16/18 1900 06/17/18 0838 06/19/18 1042  Weight: 98.8 kg 98.8 kg 98.8 kg    History of present illness:  47 year old man PMH diabetes mellitus type 2 recently admitted for osteomyelitis right foot, status post fifth ray amputation, following with podiatrist as outpatient, readmitted for concerns of osteomyelitis right f foot.  Imaging revealed osteomyelitis of the fourth ray of the right foot and also the third ray.  Admitted for osteomyelitis of the right third and fourth ray right foot.    Hospital Course:  Underwent surgery, unfortunately foot was felt to be nonviable, orthopedics was consulted and the patient underwent transtibial amputation right leg 11/1.  Postoperative course was uneventful.  Other issues include acute kidney injury with mild hyperkalemia which has resolved this point.  Diabetes has remained stable.  Patient was seen by therapy, CIR was recommended and the patient has been accepted to CIR today.  Osteomyelitis right third, fourth ray right foot.  Status post amputation fourth ray right foot, surgery  revealed wound tracking to calcaneus with minimal viable soft tissue on the plantar aspect of the foot. Status post transtibial amputation 11/1.   --Continue postoperative care as per orthopedics.  Plan for CIR today.  Acute kidney injury with mild hyperkalemia, superimposed on CKD stage III. Lisinopril on hold --Renal function appears to be at baseline.  Diabetes mellitus type 2, hemoglobin A1c 15.9 May 14, 2018. --CBG appears stable.  Continue insulin 70/30.  Continue sliding scale insulin.    Normocytic anemia, likely chronic disease. --Hemoglobin has been stable.  Constipation. --Continue aggressive bowel regimen.  Consultants:  Podiatry  Orthopedics   Procedures:  AMPUTATION 4th RAY Right Foot (Right)  Transtibial amputation right leg, Application of Prevena wound VAC  Antimicrobials:  Cefepime 10/29 >> 11/2  Vancomycin 10/29 >> 11/2  Discharge Instructions  Recommend continuing Tylenol, Percocet for pain, NovoLog 70/30 20 units twice daily, MiraLAX twice daily, senna in the evening.  Can resume home sitagliptin metformin.   No Known Allergies  The results of significant diagnostics from this hospitalization (including imaging, microbiology, ancillary and laboratory) are listed below for reference.    Significant Diagnostic Studies: Dg Foot 2 Views Right  Result Date: 06/16/2018 CLINICAL DATA:  Wound infection. Diabetes. EXAM: RIGHT FOOT - 2 VIEW COMPARISON:  06/08/2018 FINDINGS: Sequelae of fifth ray amputation are again identified at the level of the proximal fifth metatarsal. Skin staples have been removed. There is diffuse soft tissue swelling throughout the forefoot with soft tissue emphysema most notable adjacent to the first and fourth MTP joints, new from prior. There is progressive erosion of the head and distal shaft of the fourth metatarsal. There may also be  erosion of the base of the fifth toe proximal phalanx and of the head of the third  metatarsal. No acute fracture or dislocation is identified. IMPRESSION: Forefoot soft tissue swelling with soft tissue emphysema and progressive erosion of the fourth metatarsal head consistent with soft tissue infection and osteomyelitis. Possible osteomyelitis of the third metatarsal head and base of the fourth proximal phalanx. Electronically Signed   By: Logan Bores M.D.   On: 06/16/2018 20:01   Dg Foot Complete Right  Result Date: 06/08/2018 Please see detailed radiograph report in office note.   Microbiology: Recent Results (from the past 240 hour(s))  Blood Culture (routine x 2)     Status: None   Collection Time: 06/16/18  7:25 PM  Result Value Ref Range Status   Specimen Description BLOOD LEFT FOREARM  Final   Special Requests   Final    BOTTLES DRAWN AEROBIC ONLY Blood Culture results may not be optimal due to an inadequate volume of blood received in culture bottles   Culture   Final    NO GROWTH 5 DAYS Performed at [facility], [place] [address] [place], [place] [address]    Report Status 06/21/2018 FINAL  Final  Blood Culture (routine x 2)     Status: None   Collection Time: 06/16/18  7:30 PM  Result Value Ref Range Status   Specimen Description BLOOD RIGHT FOREARM  Final   Special Requests   Final    BOTTLES DRAWN AEROBIC AND ANAEROBIC Blood Culture adequate volume   Culture   Final    NO GROWTH 5 DAYS Performed at [facility], [place] [address] [place], [place] [address]    Report Status 06/21/2018 FINAL  Final  Surgical pcr screen     Status: None   Collection Time: 06/17/18  7:59 AM  Result Value Ref Range Status   MRSA, PCR NEGATIVE NEGATIVE Final   Staphylococcus aureus NEGATIVE NEGATIVE Final    Comment: (NOTE) The Xpert SA Assay (FDA approved for NASAL specimens in patients 34 years of age and older), is one component of a comprehensive surveillance program. It is not intended to diagnose infection nor to guide or monitor treatment. Performed  at [facility], [place] [address] [place], [place] [address]   Aerobic/Anaerobic Culture (surgical/deep wound)     Status: None   Collection Time: 06/17/18 10:11 AM  Result Value Ref Range Status   Specimen Description WOUND RIGHT FOOT AMPUTATION SITE  Final   Special Requests NONE  Final   Gram Stain   Final    RARE WBC PRESENT, PREDOMINANTLY PMN FEW GRAM POSITIVE COCCI    Culture   Final    MODERATE VIRIDANS STREPTOCOCCUS FEW ENTEROCOCCUS FAECALIS MODERATE PREVOTELLA SPECIES BETA LACTAMASE POSITIVE    Report Status 06/22/2018 FINAL  Final   Organism ID, Bacteria ENTEROCOCCUS FAECALIS  Final      Susceptibility   Enterococcus faecalis - MIC*    AMPICILLIN <=2 SENSITIVE Sensitive     VANCOMYCIN 1 SENSITIVE Sensitive     GENTAMICIN SYNERGY SENSITIVE Sensitive     * FEW ENTEROCOCCUS FAECALIS     Labs: Basic Metabolic Panel: Recent Labs  Lab 06/16/18 1116 06/17/18 0325 06/18/18 0535 06/20/18 0319 06/21/18 0819  NA 132* 133* 133* 131* 137  K 4.5 4.3 4.2 5.9* 4.3  CL 103 103 107 104 107  CO2 19* 20* 21* 19* 20*  GLUCOSE 172* 230* 129* 317* 126*  BUN 20 24* 20 29*  22*  CREATININE 1.78* 1.86* 1.60* 1.77* 1.54*  CALCIUM 8.5* 8.6* 7.8* 8.3* 8.2*   Liver Function Tests: Recent Labs  Lab 06/16/18 1116  AST 11*  ALT 10  ALKPHOS 68  BILITOT 0.6  PROT 6.4*  ALBUMIN 2.4*   CBC: Recent Labs  Lab 06/16/18 1116 06/17/18 0325 06/20/18 0319  WBC 15.0* 15.2* 11.5*  NEUTROABS 12.2* 12.5*  --   HGB 10.4* 10.6* 10.4*  HCT 33.8* 34.9* 34.2*  MCV 85.1 83.7 83.6  PLT 423* 448* 501*    CBG: Recent Labs  Lab 06/21/18 1157 06/21/18 1643 06/21/18 2215 06/22/18 0644 06/22/18 1159  GLUCAP 71 184* 78 159* 121*    Principal Problem:   Osteomyelitis of right foot (HCC) Active Problems:   DM2 (diabetes mellitus, type 2) (HCC)   AKI (acute kidney injury) ([place])   Normocytic anemia   Diabetic polyneuropathy associated with type 2 diabetes mellitus (HCC)   Severe  protein-calorie malnutrition (HCC)   Hyperkalemia   Acute blood loss anemia   Post-operative pain   Anemia of chronic disease   Leukocytosis   Unilateral complete BKA, right, initial encounter ([place])   Tobacco abuse   Time coordinating discharge: 35 minutes  Signed:  Murray Hodgkins, MD [facility] 06/22/2018, 3:08 PM

## 2018-06-22 NOTE — Progress Notes (Signed)
[name], MD  Physician  Physical Medicine and Rehabilitation  Consult Note  Signed  Date of Service:  06/22/2018 6:33 AM       Related encounter: ED to Hosp-Admission (Current) from 06/16/2018 in [place] All Collapse All    Show:Clear all [x]Manual[x]Template[]Copied  Added by: [x]Angiulli, Lavon Paganini, PA-C[x]Patel, Domenick Bookbinder, MD  []Hover for details      Physical Medicine and Rehabilitation Consult Reason for Consult: Decreased functional mobility Referring Physician: [facility]   HPI: Edward [name] is a 47 y.o. right-handed male with history of type 2 diabetes mellitus, chronic anemia, hypertension, tobacco abuse, left transmetatarsal amputation 05/10/2016.  Per report and patient, patient lives with 2 sons.  One level home with one-step to entry.  Independent with assistive device prior to admission.  Family assistance as needed.  Presented 06/16/2018 with right foot infection with podiatry follow-up underwent right fourth ray amputation 06/17/2018 per Dr. Carman Ching.  Postoperatively with extensive infection required debridement of which limb could not be salvaged and underwent right transtibial amputation 06/19/2018 per Dr. Sharol Given.  Wound VAC applied.  Hospital course pain management.  [employer] consulted for pre-prosthetic training.  Acute blood loss anemia 10.4.  Therapy evaluations completed with recommendations of physical medicine rehab consult.   Review of Systems  Constitutional: Negative for chills and diaphoresis.  HENT: Negative for hearing loss.   Eyes: Negative for blurred vision and double vision.  Respiratory: Negative for cough and shortness of breath.   Cardiovascular: Negative for chest pain and palpitations.  Gastrointestinal: Positive for constipation. Negative for nausea.  Genitourinary: Negative for dysuria, flank pain and hematuria.  Musculoskeletal: Positive for  myalgias.  Skin: Negative for rash.  Psychiatric/Behavioral:       Anxiety  All other systems reviewed and are negative.      Past Medical History:  Diagnosis Date  . Anxiety    self reported  . Arthritis   . Depression    self reported  . Diabetes mellitus   . Hypertension         Past Surgical History:  Procedure Laterality Date  . AMPUTATION Left 05/10/2016   Procedure: Left Transmetatarsal Amputation;  Surgeon: [name], MD;  Location: [facility];  Service: Orthopedics;  Laterality: Left;  . AMPUTATION Right 05/16/2018   Procedure: PARTIAL RAY AMPUTATION FIFTH TOE RIGHT FOOT;  Surgeon: [name], DPM;  Location: [facility];  Service: Podiatry;  Laterality: Right;  . AMPUTATION Right 06/17/2018   Procedure: AMPUTATION 4th RAY Right Foot;  Surgeon: Trula Slade, DPM;  Location: [facility];  Service: Podiatry;  Laterality: Right;  . AMPUTATION Right 06/19/2018   Procedure: RIGHT BELOW KNEE AMPUTATION;  Surgeon: [name], MD;  Location: [facility];  Service: Orthopedics;  Laterality: Right;  . APPLICATION OF WOUND VAC Right 06/19/2018   Procedure: APPLICATION OF WOUND VAC;  Surgeon: [name], MD;  Location: [facility];  Service: Orthopedics;  Laterality: Right;  . CIRCUMCISION  09/02/2011   Procedure: CIRCUMCISION ADULT;  Surgeon: Molli Hazard, MD;  Location: [facility] ;  Service: Urology;  Laterality: N/A;  . I&D EXTREMITY  09/02/2011   Procedure: IRRIGATION AND DEBRIDEMENT EXTREMITY;  Surgeon: Theodoro Kos, DO;  Location: [facility];  Service: Plastics;  Laterality: N/A;  . INCISION AND DRAINAGE OF WOUND  08/12/2011   Procedure: IRRIGATION AND DEBRIDEMENT WOUND;  Surgeon: Zenovia Jarred, MD;  Location: [facility];  Service: General;;  perineum  . IRRIGATION AND DEBRIDEMENT ABSCESS  08/12/2011   Procedure: IRRIGATION AND DEBRIDEMENT ABSCESS;  Surgeon: Molli Hazard, MD;  Location: [facility];  Service: Urology;   Laterality: N/A;  irrigation and debridment perineum    . IRRIGATION AND DEBRIDEMENT ABSCESS  08/14/2011   Procedure: MINOR INCISION AND DRAINAGE OF ABSCESS;  Surgeon: Molli Hazard, MD;  Location: [facility];  Service: Urology;  Laterality: N/A;  Perineal Wound Debridement;Placement Bilateral Testicular Thigh Pouches  . TOTAL HIP ARTHROPLASTY Left 10/11/2016   Procedure: LEFT TOTAL HIP ARTHROPLASTY ANTERIOR APPROACH;  Surgeon: Mcarthur Rossetti, MD;  Location: [facility];  Service: Orthopedics;  Laterality: Left;        Family History  Problem Relation Age of Onset  . Diabetes Other    Social History:  reports that he has been smoking cigarettes. He has never used smokeless tobacco. He reports that he drinks alcohol. He reports that he does not use drugs. Allergies: No Known Allergies       Medications Prior to Admission  Medication Sig Dispense Refill  . insulin aspart protamine - aspart (NOVOLOG 70/30 MIX) (70-30) 100 UNIT/ML FlexPen Inject 0.15 mLs (15 Units total) into the skin 2 (two) times daily. 15 mL 4  . lisinopril (PRINIVIL,ZESTRIL) 2.5 MG tablet Take 1 tablet (2.5 mg total) by mouth daily. 30 tablet 0  . sitaGLIPtin-metformin (JANUMET) 50-1000 MG tablet Take 1 tablet by mouth 2 (two) times daily with a meal. 60 tablet 3  . sulfamethoxazole-trimethoprim (BACTRIM DS,SEPTRA DS) 800-160 MG tablet Take 1 tablet by mouth 2 (two) times daily. 28 tablet 0  . traMADol (ULTRAM) 50 MG tablet Take 1 tablet (50 mg total) by mouth every 8 (eight) hours as needed. 60 tablet 0  . ampicillin (PRINCIPEN) 500 MG capsule Take 1 capsule (500 mg total) by mouth 4 (four) times daily. Take 1-2 hours before foot on an empty stomach 40 capsule 0  . blood glucose meter kit and supplies Dispense based on patient and insurance preference. Use up to four times daily as directed. (FOR ICD-9 250.00, 250.01). 1 each 0  . Insulin Pen Needle 31G X 5 MM MISC Use daily to inject insulin as instructed 100 each  2  . lactobacillus acidophilus & bulgar (LACTINEX) chewable tablet Chew 1 tablet by mouth 3 (three) times daily with meals. 60 tablet 0  . magic mouthwash w/lidocaine SOLN Take 5 mLs by mouth 3 (three) times daily as needed for mouth pain. (Patient not taking: Reported on 06/16/2018) 60 mL 0    Home: Home Living Family/patient expects to be discharged to:: Private residence Living Arrangements: Children Available Help at Discharge: Family, Available 24 hours/day Type of Home: House Home Access: Stairs to enter Technical brewer of Steps: 1 Entrance Stairs-Rails: Left Home Layout: One level Bathroom Shower/Tub: Multimedia programmer: Standard Home Equipment: Environmental consultant - 2 wheels, [employer] - single point, Bedside commode, Wheelchair - manual Additional Comments: pt has been home wiht family around the clock  Functional History: Prior Function Level of Independence: Independent with assistive device(s) Comments: cane or RW depending on his abilities, has walked in last few days Functional Status:  Mobility: Bed Mobility Overal bed mobility: Needs Assistance Bed Mobility: Rolling, Supine to Sit Rolling: Supervision Supine to sit: Min assist Sit to supine: Min guard General bed mobility comments: Performed rolling all the way to prone and back with cues for positioning and use  of rail prn; min handheld assist to pull to sit Transfers Overall transfer level: Needs assistance Equipment used: Rolling walker (2 wheeled) Transfers: Sit to/from Stand, [employer] Transfers Sit to Stand: Mod assist, Min assist Stand pivot transfers: Min assist General transfer comment: mod assist to power up then min assist to steady; practiced sit to stand twice, then stood and pivoted to recliner on pt's L side using RW Ambulation/Gait Ambulation/Gait assistance: Min assist Gait Distance (Feet): 4 Feet Assistive device: Rolling walker (2 wheeled), 1 person hand held assist General Gait  Details: pt stood side of bed and was unsteady so practiced mult times to get side shift of LLE on RW with min assist, heavy reliance on RW Gait velocity: reduced Gait velocity interpretation: <1.8 ft/sec, indicate of risk for recurrent falls  ADL:  Cognition: Cognition Overall Cognitive Status: Within Functional Limits for tasks assessed Orientation Level: Oriented X4 Cognition Arousal/Alertness: Awake/alert Behavior During Therapy: WFL for tasks assessed/performed Overall Cognitive Status: Within Functional Limits for tasks assessed  Blood pressure (!) 164/99, pulse (!) 101, temperature 98.2 F (36.8 C), temperature source Oral, resp. rate 18, height 6' 0.99" (1.854 m), weight 98.8 kg, SpO2 98 %. Physical Exam  Vitals reviewed. Constitutional: He is oriented to person, place, and time. He appears well-developed and well-nourished.  HENT:  Head: Normocephalic and atraumatic.  Eyes: EOM are normal. Right eye exhibits no discharge. Left eye exhibits no discharge.  Neck: Normal range of motion. Neck supple. No thyromegaly present.  Cardiovascular: Normal rate and regular rhythm.  Respiratory: Effort normal and breath sounds normal. No respiratory distress.  GI: Soft. Bowel sounds are normal. He exhibits no distension.  Musculoskeletal:  Right BKA with tenderness Left transmet amputation  Neurological: He is alert and oriented to person, place, and time.  Motor: B/l UE 4/5 proximal to distal LLE: 4/5 proximal to distal RLE: HF 4+/5 proximal to distal Sensation diminished to light touch left foot  Skin:  Left transmetatarsal amputation site well-healed.  Right BKA with wound VAC in place appropriately tender  Psychiatric: He has a normal mood and affect. His behavior is normal. Thought content normal.    LabResultsLast24Hours       Results for orders placed or performed during the hospital encounter of 06/16/18 (from the past 24 hour(s))  Glucose, capillary     Status:  Abnormal   Collection Time: 06/21/18  6:45 AM  Result Value Ref Range   Glucose-Capillary 108 (H) 70 - 99 mg/dL  Basic metabolic panel     Status: Abnormal   Collection Time: 06/21/18  8:19 AM  Result Value Ref Range   Sodium 137 135 - 145 mmol/L   Potassium 4.3 3.5 - 5.1 mmol/L   Chloride 107 98 - 111 mmol/L   CO2 20 (L) 22 - 32 mmol/L   Glucose, Bld 126 (H) 70 - 99 mg/dL   BUN 22 (H) 6 - 20 mg/dL   Creatinine, Ser 1.54 (H) 0.61 - 1.24 mg/dL   Calcium 8.2 (L) 8.9 - 10.3 mg/dL   GFR calc non Af Amer 52 (L) >60 mL/min   GFR calc Af Amer 60 (L) >60 mL/min   Anion gap 10 5 - 15  Glucose, capillary     Status: None   Collection Time: 06/21/18 11:57 AM  Result Value Ref Range   Glucose-Capillary 71 70 - 99 mg/dL  Glucose, capillary     Status: Abnormal   Collection Time: 06/21/18  4:43 PM  Result Value Ref Range  Glucose-Capillary 184 (H) 70 - 99 mg/dL  Glucose, capillary     Status: None   Collection Time: 06/21/18 10:15 PM  Result Value Ref Range   Glucose-Capillary 78 70 - 99 mg/dL   Comment 1 Document in Chart      ImagingResults(Last48hours)  No results found.    Assessment/Plan: Diagnosis: Right BKA Labs independently reviewed.  Records reviewed and summated above. Clean amputation daily with soap and water Monitor incision site for signs of infection or impending skin breakdown. Staples to remain in place for 3-4 weeks Stump shrinker, for edema control  Scar mobilization massaging to prevent soft tissue adherence Stump protector during therapies Prevent flexion contractures by implementing the following:              Encourage prone lying for 20-30 mins per day BID to avoid hip flexion       Contractures if medically appropriate;             Avoid pillow under knees when patient is lying in bed in order to prevent both        knee and hip flexion contractures;             Avoid prolonged sitting Post surgical pain control with oral  medication Phantom limb pain control with physical modalities including desensitization techniques (gentle self massage to the residual stump,hot packs if sensation intact, [place]) and mirror therapy, TENS. If ineffective, consider pharmacological treatment for neuropathic pain (e.g gabapentin, pregabalin, amytriptalyine, duloxetine).  When using wheelchair, patient should have knee on amputated side fully extended with board under the seat cushion. Avoid injury to contralateral side  1. Does the need for close, 24 hr/day medical supervision in concert with the patient's rehab needs make it unreasonable for this patient to be served in a less intensive setting? Yes  2. Co-Morbidities requiring supervision/potential complications: Acute blood loss anemia (repeat labs, transfuse to ensure appropriate perfusion for increased activity tolerance), post-op pain management (Biofeedback training with therapies to help reduce reliance on opiate pain medications, monitor pain control during therapies, and sedation at rest and titrate to maximum efficacy to ensure participation and gains in therapies), diabetes mellitus (Monitor in accordance with exercise and adjust meds as necessary), chronic anemia (repeat labs, transfuse to ensure appropriate perfusion for increased activity tolerance), HTN (monitor and provide prns in accordance with increased physical exertion and pain), tobacco abuse (counsel), left transmetatarsal amputation, leukocytosis (repeat labs, cont to monitor for signs and symptoms of infection, further workup if indicated), AKI (avoid nephrotoxic meds) 3. Due to safety, skin/wound care, disease management, pain management and patient education, does the patient require 24 hr/day rehab nursing? Yes 4. Does the patient require coordinated care of a physician, rehab nurse, PT (1-2 hrs/day, 5 days/week) and OT (1-2 hrs/day, 5 days/week) to address physical and functional deficits in the context of the above  medical diagnosis(es)? Yes Addressing deficits in the following areas: balance, endurance, locomotion, strength, transferring, bathing, dressing, toileting and psychosocial support 5. Can the patient actively participate in an intensive therapy program of at least 3 hrs of therapy per day at least 5 days per week? Yes 6. The potential for patient to make measurable gains while on inpatient rehab is excellent 7. Anticipated functional outcomes upon discharge from inpatient rehab are supervision  with PT, supervision with OT, n/a with SLP. 8. Estimated rehab length of stay to reach the above functional goals is: 8-12 days. 9. Anticipated D/C setting: Home 10. Anticipated post D/C treatments:  HH therapy and Home excercise program 11. Overall Rehab/Functional Prognosis: good  RECOMMENDATIONS: This patient's condition is appropriate for continued rehabilitative care in the following setting: CIR Patient has agreed to participate in recommended program. Yes Note that insurance prior authorization may be required for reimbursement for recommended care.  Comment: Rehab Admissions Coordinator to follow up.   I have personally performed a face to face diagnostic evaluation, including, but not limited to relevant history and physical exam findings, of this patient and developed relevant assessment and plan.  Additionally, I have reviewed and concur with the physician assistant's documentation above.   Delice Lesch, MD, ABPMR Lavon Paganini Angiulli, PA-C 06/22/2018        Revision History              Routing History

## 2018-06-22 NOTE — Progress Notes (Signed)
Inpatient Rehabilitation Admissions Coordinator  I met with patient at bedside and discussed goals and expectations of an inpt rehab admit. He is in agreement and prefers inpt rehab rather than SNF. I contacted Dr. Sarajane Jews and he will arrange d/c. I alerted RN CM, SW and RN. I will make the arrangements to admit today.  Danne Baxter, RN, MSN Rehab Admissions Coordinator [phone] 06/22/2018 2:40 PM

## 2018-06-22 NOTE — Progress Notes (Signed)
Pt arrived to unit, alert, wound vac attached to right BKA with no drainage noted at 125 continuous, no air leaks . No skin issues. Reports that doesn't want Flu vaccine at this time but may change mind prior to discharge. Also is interested in advance directives.  Information handed out to pt. Bed in low position, alarm on and functioning, call bell in reach. Will continue to monitor.

## 2018-06-22 NOTE — Consult Note (Signed)
Physical Medicine and Rehabilitation Consult Reason for Consult: Decreased functional mobility Referring Physician: [facility]   HPI: Edward [name] is a 47 y.o. right-handed male with history of type 2 diabetes mellitus, chronic anemia, hypertension, tobacco abuse, left transmetatarsal amputation 05/10/2016.  Per report and patient, patient lives with 2 sons.  One level home with one-step to entry.  Independent with assistive device prior to admission.  Family assistance as needed.  Presented 06/16/2018 with right foot infection with podiatry follow-up underwent right fourth ray amputation 06/17/2018 per Dr. Carman Ching.  Postoperatively with extensive infection required debridement of which limb could not be salvaged and underwent right transtibial amputation 06/19/2018 per Dr. Sharol Given.  Wound VAC applied.  Hospital course pain management.  [employer] consulted for pre-prosthetic training.  Acute blood loss anemia 10.4.  Therapy evaluations completed with recommendations of physical medicine rehab consult.   Review of Systems  Constitutional: Negative for chills and diaphoresis.  HENT: Negative for hearing loss.   Eyes: Negative for blurred vision and double vision.  Respiratory: Negative for cough and shortness of breath.   Cardiovascular: Negative for chest pain and palpitations.  Gastrointestinal: Positive for constipation. Negative for nausea.  Genitourinary: Negative for dysuria, flank pain and hematuria.  Musculoskeletal: Positive for myalgias.  Skin: Negative for rash.  Psychiatric/Behavioral:       Anxiety  All other systems reviewed and are negative.  Past Medical History:  Diagnosis Date  . Anxiety    self reported  . Arthritis   . Depression    self reported  . Diabetes mellitus   . Hypertension    Past Surgical History:  Procedure Laterality Date  . AMPUTATION Left 05/10/2016   Procedure: Left Transmetatarsal Amputation;  Surgeon: [name], MD;  Location: [place];  Service: Orthopedics;  Laterality: Left;  . AMPUTATION Right 05/16/2018   Procedure: PARTIAL RAY AMPUTATION FIFTH TOE RIGHT FOOT;  Surgeon: [name], DPM;  Location: [place];  Service: Podiatry;  Laterality: Right;  . AMPUTATION Right 06/17/2018   Procedure: AMPUTATION 4th RAY Right Foot;  Surgeon: Trula Slade, DPM;  Location: [place];  Service: Podiatry;  Laterality: Right;  . AMPUTATION Right 06/19/2018   Procedure: RIGHT BELOW KNEE AMPUTATION;  Surgeon: [name], MD;  Location: [place];  Service: Orthopedics;  Laterality: Right;  . APPLICATION OF WOUND VAC Right 06/19/2018   Procedure: APPLICATION OF WOUND VAC;  Surgeon: [name], MD;  Location: [place];  Service: Orthopedics;  Laterality: Right;  . CIRCUMCISION  09/02/2011   Procedure: CIRCUMCISION ADULT;  Surgeon: Molli Hazard, MD;  Location: [place];  Service: Urology;  Laterality: N/A;  . I&D EXTREMITY  09/02/2011   Procedure: IRRIGATION AND DEBRIDEMENT EXTREMITY;  Surgeon: Theodoro Kos, DO;  Location: [place];  Service: Plastics;  Laterality: N/A;  . INCISION AND DRAINAGE OF WOUND  08/12/2011   Procedure: IRRIGATION AND DEBRIDEMENT WOUND;  Surgeon: Zenovia Jarred, MD;  Location: [place];  Service: General;;  perineum  . IRRIGATION AND DEBRIDEMENT ABSCESS  08/12/2011   Procedure: IRRIGATION AND DEBRIDEMENT ABSCESS;  Surgeon: Molli Hazard, MD;  Location: [place];  Service: Urology;  Laterality: N/A;  irrigation and debridment perineum    . IRRIGATION AND DEBRIDEMENT ABSCESS  08/14/2011   Procedure: MINOR INCISION AND DRAINAGE OF ABSCESS;  Surgeon: Molli Hazard, MD;  Location: [place];  Service: Urology;  Laterality: N/A;  Perineal Wound Debridement;Placement Bilateral Testicular  Thigh Pouches  . TOTAL HIP ARTHROPLASTY Left 10/11/2016   Procedure: LEFT TOTAL HIP ARTHROPLASTY ANTERIOR APPROACH;  Surgeon: Mcarthur Rossetti, MD;  Location: [facility];  Service:  Orthopedics;  Laterality: Left;   Family History  Problem Relation Age of Onset  . Diabetes Other    Social History:  reports that he has been smoking cigarettes. He has never used smokeless tobacco. He reports that he drinks alcohol. He reports that he does not use drugs. Allergies: No Known Allergies Medications Prior to Admission  Medication Sig Dispense Refill  . insulin aspart protamine - aspart (NOVOLOG 70/30 MIX) (70-30) 100 UNIT/ML FlexPen Inject 0.15 mLs (15 Units total) into the skin 2 (two) times daily. 15 mL 4  . lisinopril (PRINIVIL,ZESTRIL) 2.5 MG tablet Take 1 tablet (2.5 mg total) by mouth daily. 30 tablet 0  . sitaGLIPtin-metformin (JANUMET) 50-1000 MG tablet Take 1 tablet by mouth 2 (two) times daily with a meal. 60 tablet 3  . sulfamethoxazole-trimethoprim (BACTRIM DS,SEPTRA DS) 800-160 MG tablet Take 1 tablet by mouth 2 (two) times daily. 28 tablet 0  . traMADol (ULTRAM) 50 MG tablet Take 1 tablet (50 mg total) by mouth every 8 (eight) hours as needed. 60 tablet 0  . ampicillin (PRINCIPEN) 500 MG capsule Take 1 capsule (500 mg total) by mouth 4 (four) times daily. Take 1-2 hours before foot on an empty stomach 40 capsule 0  . blood glucose meter kit and supplies Dispense based on patient and insurance preference. Use up to four times daily as directed. (FOR ICD-9 250.00, 250.01). 1 each 0  . Insulin Pen Needle 31G X 5 MM MISC Use daily to inject insulin as instructed 100 each 2  . lactobacillus acidophilus & bulgar (LACTINEX) chewable tablet Chew 1 tablet by mouth 3 (three) times daily with meals. 60 tablet 0  . magic mouthwash w/lidocaine SOLN Take 5 mLs by mouth 3 (three) times daily as needed for mouth pain. (Patient not taking: Reported on 06/16/2018) 60 mL 0    Home: Home Living Family/patient expects to be discharged to:: Private residence Living Arrangements: Children Available Help at Discharge: Family, Available 24 hours/day Type of Home: House Home Access:  Stairs to enter Technical brewer of Steps: 1 Entrance Stairs-Rails: Left Home Layout: One level Bathroom Shower/Tub: Multimedia programmer: Standard Home Equipment: Environmental consultant - 2 wheels, [employer] - single point, Bedside commode, Wheelchair - manual Additional Comments: pt has been home wiht family around the clock  Functional History: Prior Function Level of Independence: Independent with assistive device(s) Comments: cane or RW depending on his abilities, has walked in last few days Functional Status:  Mobility: Bed Mobility Overal bed mobility: Needs Assistance Bed Mobility: Rolling, Supine to Sit Rolling: Supervision Supine to sit: Min assist Sit to supine: Min guard General bed mobility comments: Performed rolling all the way to prone and back with cues for positioning and use of rail prn; min handheld assist to pull to sit Transfers Overall transfer level: Needs assistance Equipment used: Rolling walker (2 wheeled) Transfers: Sit to/from Stand, [employer] Transfers Sit to Stand: Mod assist, Min assist Stand pivot transfers: Min assist General transfer comment: mod assist to power up then min assist to steady; practiced sit to stand twice, then stood and pivoted to recliner on pt's L side using RW Ambulation/Gait Ambulation/Gait assistance: Min assist Gait Distance (Feet): 4 Feet Assistive device: Rolling walker (2 wheeled), 1 person hand held assist General Gait Details: pt stood side of bed and was  unsteady so practiced mult times to get side shift of LLE on RW with min assist, heavy reliance on RW Gait velocity: reduced Gait velocity interpretation: <1.8 ft/sec, indicate of risk for recurrent falls    ADL:    Cognition: Cognition Overall Cognitive Status: Within Functional Limits for tasks assessed Orientation Level: Oriented X4 Cognition Arousal/Alertness: Awake/alert Behavior During Therapy: WFL for tasks assessed/performed Overall Cognitive Status:  Within Functional Limits for tasks assessed  Blood pressure (!) 164/99, pulse (!) 101, temperature 98.2 F (36.8 C), temperature source Oral, resp. rate 18, height 6' 0.99" (1.854 m), weight 98.8 kg, SpO2 98 %. Physical Exam  Vitals reviewed. Constitutional: He is oriented to person, place, and time. He appears well-developed and well-nourished.  HENT:  Head: Normocephalic and atraumatic.  Eyes: EOM are normal. Right eye exhibits no discharge. Left eye exhibits no discharge.  Neck: Normal range of motion. Neck supple. No thyromegaly present.  Cardiovascular: Normal rate and regular rhythm.  Respiratory: Effort normal and breath sounds normal. No respiratory distress.  GI: Soft. Bowel sounds are normal. He exhibits no distension.  Musculoskeletal:  Right BKA with tenderness Left transmet amputation  Neurological: He is alert and oriented to person, place, and time.  Motor: B/l UE 4/5 proximal to distal LLE: 4/5 proximal to distal RLE: HF 4+/5 proximal to distal Sensation diminished to light touch left foot  Skin:  Left transmetatarsal amputation site well-healed.  Right BKA with wound VAC in place appropriately tender  Psychiatric: He has a normal mood and affect. His behavior is normal. Thought content normal.    Results for orders placed or performed during the hospital encounter of 06/16/18 (from the past 24 hour(s))  Glucose, capillary     Status: Abnormal   Collection Time: 06/21/18  6:45 AM  Result Value Ref Range   Glucose-Capillary 108 (H) 70 - 99 mg/dL  Basic metabolic panel     Status: Abnormal   Collection Time: 06/21/18  8:19 AM  Result Value Ref Range   Sodium 137 135 - 145 mmol/L   Potassium 4.3 3.5 - 5.1 mmol/L   Chloride 107 98 - 111 mmol/L   CO2 20 (L) 22 - 32 mmol/L   Glucose, Bld 126 (H) 70 - 99 mg/dL   BUN 22 (H) 6 - 20 mg/dL   Creatinine, Ser 1.54 (H) 0.61 - 1.24 mg/dL   Calcium 8.2 (L) 8.9 - 10.3 mg/dL   GFR calc non Af Amer 52 (L) >60 mL/min   GFR  calc Af Amer 60 (L) >60 mL/min   Anion gap 10 5 - 15  Glucose, capillary     Status: None   Collection Time: 06/21/18 11:57 AM  Result Value Ref Range   Glucose-Capillary 71 70 - 99 mg/dL  Glucose, capillary     Status: Abnormal   Collection Time: 06/21/18  4:43 PM  Result Value Ref Range   Glucose-Capillary 184 (H) 70 - 99 mg/dL  Glucose, capillary     Status: None   Collection Time: 06/21/18 10:15 PM  Result Value Ref Range   Glucose-Capillary 78 70 - 99 mg/dL   Comment 1 Document in Chart    No results found.  Assessment/Plan: Diagnosis: Right BKA Labs independently reviewed.  Records reviewed and summated above. Clean amputation daily with soap and water Monitor incision site for signs of infection or impending skin breakdown. Staples to remain in place for 3-4 weeks Stump shrinker, for edema control  Scar mobilization massaging to prevent soft tissue adherence  Stump protector during therapies Prevent flexion contractures by implementing the following:   Encourage prone lying for 20-30 mins per day BID to avoid hip flexion  Contractures if medically appropriate;  Avoid pillow under knees when patient is lying in bed in order to prevent both  knee and hip flexion contractures;  Avoid prolonged sitting Post surgical pain control with oral medication Phantom limb pain control with physical modalities including desensitization techniques (gentle self massage to the residual stump,hot packs if sensation intact, [place]) and mirror therapy, TENS. If ineffective, consider pharmacological treatment for neuropathic pain (e.g gabapentin, pregabalin, amytriptalyine, duloxetine).  When using wheelchair, patient should have knee on amputated side fully extended with board under the seat cushion. Avoid injury to contralateral side  1. Does the need for close, 24 hr/day medical supervision in concert with the patient's rehab needs make it unreasonable for this patient to be served in a less  intensive setting? Yes  2. Co-Morbidities requiring supervision/potential complications: Acute blood loss anemia (repeat labs, transfuse to ensure appropriate perfusion for increased activity tolerance), post-op pain management (Biofeedback training with therapies to help reduce reliance on opiate pain medications, monitor pain control during therapies, and sedation at rest and titrate to maximum efficacy to ensure participation and gains in therapies), diabetes mellitus (Monitor in accordance with exercise and adjust meds as necessary), chronic anemia (repeat labs, transfuse to ensure appropriate perfusion for increased activity tolerance), HTN (monitor and provide prns in accordance with increased physical exertion and pain), tobacco abuse (counsel), left transmetatarsal amputation, leukocytosis (repeat labs, cont to monitor for signs and symptoms of infection, further workup if indicated), AKI (avoid nephrotoxic meds) 3. Due to safety, skin/wound care, disease management, pain management and patient education, does the patient require 24 hr/day rehab nursing? Yes 4. Does the patient require coordinated care of a physician, rehab nurse, PT (1-2 hrs/day, 5 days/week) and OT (1-2 hrs/day, 5 days/week) to address physical and functional deficits in the context of the above medical diagnosis(es)? Yes Addressing deficits in the following areas: balance, endurance, locomotion, strength, transferring, bathing, dressing, toileting and psychosocial support 5. Can the patient actively participate in an intensive therapy program of at least 3 hrs of therapy per day at least 5 days per week? Yes 6. The potential for patient to make measurable gains while on inpatient rehab is excellent 7. Anticipated functional outcomes upon discharge from inpatient rehab are supervision  with PT, supervision with OT, n/a with SLP. 8. Estimated rehab length of stay to reach the above functional goals is: 8-12 days. 9. Anticipated D/C  setting: Home 10. Anticipated post D/C treatments: HH therapy and Home excercise program 11. Overall Rehab/Functional Prognosis: good  RECOMMENDATIONS: This patient's condition is appropriate for continued rehabilitative care in the following setting: CIR Patient has agreed to participate in recommended program. Yes Note that insurance prior authorization may be required for reimbursement for recommended care.  Comment: Rehab Admissions Coordinator to follow up.   I have personally performed a face to face diagnostic evaluation, including, but not limited to relevant history and physical exam findings, of this patient and developed relevant assessment and plan.  Additionally, I have reviewed and concur with the physician assistant's documentation above.   Delice Lesch, MD, ABPMR Lavon Paganini Angiulli, PA-C 06/22/2018

## 2018-06-22 NOTE — Evaluation (Signed)
Occupational Therapy Evaluation Patient Details Name: Edward [name] MRN: [id number] DOB: 1971-04-03 Today's Date: 06/22/2018    History of Present Illness 47 y/o male admitted secondary to R foot osteomyelitis. Pt was/p R partial 5th ray amputation but now is BK amputee with surgery 06/19/18.  Has AKI, nausea and has protein and calorie malnutrition.  PMH includes DM, HTN, and L transmet amputation, anemia, CKD 3,    Clinical Impression   PTA Pt mod I with DME for mobility - independent in ADL. Pt is currently mod A for LB ADL and min A for sit<>stand and stand pivot transfers. Pt anxious about mobility, but performed well. Pt was able to get fully dressed, perform grooming sitting EOB. Pt is excellent CIR candidate and is highly motivated to return to independent level. OT will defer further services to CIR level as Pt is set to dc today. Thank you for the opportunity to serve this patient.     Follow Up Recommendations  CIR;Supervision/Assistance - 24 hour    Equipment Recommendations  Other (comment)(defer to next venue of care)    Recommendations for Other Services       Precautions / Restrictions Precautions Precautions: Fall Required Braces or Orthoses: Other Brace/Splint Other Brace/Splint: Pt has a stump protector Restrictions Weight Bearing Restrictions: Yes RLE Weight Bearing: Non weight bearing      Mobility Bed Mobility Overal bed mobility: Needs Assistance Bed Mobility: Supine to Sit;Sit to Supine Rolling: Supervision   Supine to sit: Supervision Sit to supine: Supervision   General bed mobility comments: supervision for safety  Transfers Overall transfer level: Needs assistance Equipment used: Rolling walker (2 wheeled) Transfers: Sit to/from [employer] Sit to Stand: Min assist Stand pivot transfers: Min assist       General transfer comment: initially anxious about transition, min steadying assist for power up upon initial transfer.  Pt with decreasing assist needed when performed x5    Balance Overall balance assessment: Needs assistance Sitting-balance support: Single extremity supported(foot supported) Sitting balance-Leahy Scale: Fair     Standing balance support: Bilateral upper extremity supported;During functional activity Standing balance-Leahy Scale: Poor Standing balance comment: working to control balance on RW with LLE which has transmet amputation                           ADL either performed or assessed with clinical judgement   ADL Overall ADL's : Needs assistance/impaired Eating/Feeding: Independent   Grooming: Set up;Sitting   Upper Body Bathing: Min guard;Sitting   Lower Body Bathing: Min guard;Sitting/lateral leans Lower Body Bathing Details (indicate cue type and reason): able to reach to ankle of LLE Upper Body Dressing : Set up;Sitting   Lower Body Dressing: Moderate assistance;Sitting/lateral leans Lower Body Dressing Details (indicate cue type and reason): initial assist to thread feet through legs, Pt using lateral leans to bring shorts over bottom Toilet Transfer: Minimal assistance;Stand-pivot;RW   Toileting- Clothing Manipulation and Hygiene: Set up;Sitting/lateral lean               Vision         Perception     Praxis      Pertinent Vitals/Pain Pain Assessment: 0-10 Pain Score: 4  Pain Location: R residual limb with mobility and therex Pain Descriptors / Indicators: Aching Pain Intervention(s): Monitored during session;Repositioned;Other (comment)(soft massage for phantom limb pain)     Hand Dominance Right   Extremity/Trunk Assessment Upper Extremity Assessment Upper Extremity Assessment: Overall  WFL for tasks assessed   Lower Extremity Assessment Lower Extremity Assessment: RLE deficits/detail;LLE deficits/detail RLE Deficits / Details: new BK amputation with NWB on RLE RLE Coordination: decreased gross motor LLE Deficits / Details:  transmet amputation (PTA) - has a specialized shoe that he uses for mobility - but at the hospital only has a regular shoe with a sock stuffed in it   Cervical / Trunk Assessment Cervical / Trunk Assessment: Normal   Communication Communication Communication: No difficulties   Cognition Arousal/Alertness: Awake/alert Behavior During Therapy: WFL for tasks assessed/performed Overall Cognitive Status: Within Functional Limits for tasks assessed                                     General Comments       Exercises     Shoulder Instructions      Home Living Family/patient expects to be discharged to:: Private residence Living Arrangements: Children Available Help at Discharge: Family;Available 24 hours/day Type of Home: House Home Access: Stairs to enter [employer] of Steps: 1 Entrance Stairs-Rails: Left Home Layout: One level     Bathroom Shower/Tub: Occupational psychologist: Standard Bathroom Accessibility: Yes How Accessible: Accessible via walker Home Equipment: [place] - 2 wheels;Cane - single point;Bedside commode;Wheelchair - manual      Lives With: Son    Prior Functioning/Environment Level of Independence: Independent with assistive device(s)        Comments: cane or RW depending on his abilities, has walked in last few days        OT Problem List: Decreased range of motion;Decreased activity tolerance;Impaired balance (sitting and/or standing);Decreased knowledge of use of DME or AE;Decreased knowledge of precautions;Pain      OT Treatment/Interventions:      OT Goals(Current goals can be found in the care plan section) Acute Rehab OT Goals Patient Stated Goal: to get stronger and walk again OT Goal Formulation: With patient Time For Goal Achievement: 07/06/18 Potential to Achieve Goals: Good  OT Frequency:     Barriers to D/C:            Co-evaluation              AM-PAC PT "6 Clicks" Daily Activity      Outcome Measure Help from another person eating meals?: None Help from another person taking care of personal grooming?: None(in sitting) Help from another person toileting, which includes using toliet, bedpan, or urinal?: A Little Help from another person bathing (including washing, rinsing, drying)?: A Lot Help from another person to put on and taking off regular upper body clothing?: None Help from another person to put on and taking off regular lower body clothing?: A Lot 6 Click Score: 19   End of Session Equipment Utilized During Treatment: Gait belt;Rolling walker;Other (comment)(stump protector) Nurse Communication: Mobility status  Activity Tolerance: Patient tolerated treatment well Patient left: in bed;with call bell/phone within reach  OT Visit Diagnosis: Unsteadiness on feet (R26.81);Other abnormalities of gait and mobility (R26.89);Pain Pain - Right/Left: Right Pain - part of body: Leg                Time: 1459-1531 OT Time Calculation (min): 32 min Charges:  OT General Charges $OT Visit: 1 Visit OT Evaluation $OT Eval Moderate Complexity: 1 Mod OT Treatments $Self Care/Home Management : 8-22 mins  Hulda Humphrey OTR/L Acute Rehabilitation Services Pager: [phone] Office: [phone]  Mickel Baas  J Trishna Cwik 06/22/2018, 4:30 PM

## 2018-06-22 NOTE — Progress Notes (Signed)
PROGRESS NOTE  Edward [name] INO:[id number] DOB: [birth year] DOA: 06/16/2018 PCP: [name], MD  Brief Narrative: 47 year old man PMH diabetes mellitus type 2 recently admitted for osteomyelitis right foot, status post fifth ray amputation, following with podiatrist as outpatient, readmitted for concerns of osteomyelitis right f foot.  Imaging revealed osteomyelitis of the fourth ray of the right foot and also the third ray.  Admitted for osteomyelitis of the right third and fourth ray right foot.  Underwent surgery, unfortunately foot was felt to be nonviable, orthopedics was consulted and the patient underwent transtibial amputation right leg 11/1.  Assessment/Plan Osteomyelitis right third, fourth ray right foot.  Status post amputation fourth ray right foot, surgery revealed wound tracking to calcaneus with minimal viable soft tissue on the plantar aspect of the foot. Status post transtibial amputation 11/1.   --Continue physical therapy.  Physical therapy has recommended CIR.  Await consultation.  Acute kidney injury with mild hyperkalemia, superimposed on CKD stage III. Lisinopril on hold --Renal function appears to be at baseline.  Diabetes mellitus type 2, hemoglobin A1c 15.9 May 14, 2018. --CBG appears stable.  Continue insulin 70/30.  Continue sliding scale insulin.    Normocytic anemia, likely chronic disease. --Hemoglobin has been stable.  Constipation. --Continue aggressive bowel regimen.   Await PMR consultation.  DVT prophylaxis: SCDs Code Status: Full Family Communication: none  Disposition Plan: Rehab when has a bed   Murray Hodgkins, MD  [facility] Direct contact: [phone] --Via amion app OR  --[email or web address]; password [email or web address]  7PM-7AM contact night coverage as above 06/22/2018, 12:33 PM  LOS: 6 days   Consultants:  Podiatry  Orthopedics   Procedures:  AMPUTATION 4th RAY Right Foot (Right)  Transtibial amputation right leg,  Application of Prevena wound VAC  Antimicrobials:  Cefepime 10/29 >> 11/2  Vancomycin 10/29 >> 11/2  Interval history/Subjective: Pain is well controlled, bowels have not moved but he is passing lots of gas and think she will have a bowel movement soon.  No significant abdominal discomfort.  Objective: Vitals:  Vitals:   06/21/18 1931 06/22/18 0430  BP: (!) 155/89 (!) 164/99  Pulse: 97 (!) 101  Resp:    Temp: 98.6 F (37 C) 98.2 F (36.8 C)  SpO2: 99% 98%    Exam: Constitutional:   . Appears calm and comfortable Respiratory:  . CTA bilaterally, no w/r/r.  . Respiratory effort normal.  Cardiovascular:  . RRR, no m/r/g . No significant LLE extremity edema   Abdomen:  . Soft, ntnd Psychiatric:  . Mental status o Mood, affect appropriate  I have personally reviewed the following:   Data: . Urine output 1550 . CBG stable no lows  Scheduled Meds: . feeding supplement (PRO-STAT SUGAR FREE 64)  30 mL Oral BID  . insulin aspart  0-9 Units Subcutaneous TID WC  . insulin aspart protamine- aspart  20 Units Subcutaneous BID WC  . multivitamin with minerals  1 tablet Oral Daily  . senna  1 tablet Oral QHS   Continuous Infusions: . sodium chloride Stopped (06/20/18 1336)  . lactated ringers 10 mL/hr at 06/17/18 0840  . lactated ringers 10 mL/hr at 06/19/18 1051  . methocarbamol (ROBAXIN) IV      Principal Problem:   Osteomyelitis of right foot (HCC) Active Problems:   DM2 (diabetes mellitus, type 2) (HCC)   AKI (acute kidney injury) ([place])   Normocytic anemia   Diabetic polyneuropathy associated with type 2 diabetes mellitus ([place])   Severe protein-calorie malnutrition ([place])  Hyperkalemia   LOS: 6 days

## 2018-06-22 NOTE — PMR Pre-admission (Signed)
PMR Admission Coordinator Pre-Admission Assessment  Patient: Edward [name] is an 47 y.o., male MRN: [id number] DOB: 1971-02-13 Height: 6' 0.99" (185.4 cm) Weight: 98.8 kg              Insurance Information HMO:     PPO:      PCP:      IPA:      80/20:      OTHER:  PRIMARY: Medicaid of [place]      Policy#: [id number]      Subscriber: pt Benefits:  Phone #: [phone]     Name: 06/22/2018 Eff. Date: active 06/22/2018    MADMN  Medicaid Application Date:       Case Manager:  Disability Application Date:       Case Worker:   Emergency Contact Information Contact Information    Name Relation Home Work [place], [place] Son   [phone]   MICHELANGELO, RINDFLEISCH [phone]       Current Medical History  Patient Admitting Diagnosis: right BKA  History of Present Illness:  Jamie L. Lamarque is a 47 year old right-handed male history of type 2 diabetes mellitus, chronic anemia, hypertension, tobacco abuse, left transmetatarsal amputation 05/10/2016.   Presented 06/16/2018 with right foot infection with podiatry follow-up and underwent right fourth ray amputation 06/17/2018 per Dr. Carman Ching.  Postoperatively with extensive infection requiring debridement of which limb could not be salvaged and underwent right transtibial amputation 06/19/2018 per Dr. Sharol Given.  Wound VAC applied.  Hospital course pain management.  [employer] consulted for pre-prosthetic training.  Acute blood loss anemia 10.4.     Past Medical History  Past Medical History:  Diagnosis Date  . Anxiety    self reported  . Arthritis   . Depression    self reported  . Diabetes mellitus   . Hypertension     Family History  family history includes Diabetes in his other.  Prior Rehab/Hospitalizations:  Has the patient had major surgery during 100 days prior to admission? Yes  Current Medications   Current Facility-Administered Medications:  .  0.9 %  sodium chloride infusion, , Intravenous, Continuous, [name], MD, Stopped at 06/20/18 1336 .  acetaminophen (TYLENOL) tablet 325-650 mg, 325-650 mg, Oral, Q6H PRN, [name], MD .  alum & mag hydroxide-simeth (MAALOX/MYLANTA) 200-200-20 MG/5ML suspension 30 mL, 30 mL, Oral, Q4H PRN, Samuella Cota, MD, 30 mL at 06/20/18 1340 .  feeding supplement (PRO-STAT SUGAR FREE 64) liquid 30 mL, 30 mL, Oral, BID, [name], MD, 30 mL at 06/22/18 1300 .  HYDROmorphone (DILAUDID) injection 0.5-1 mg, 0.5-1 mg, Intravenous, Q4H PRN, [name], MD, 1 mg at 06/21/18 0950 .  insulin aspart (novoLOG) injection 0-9 Units, 0-9 Units, Subcutaneous, TID WC, [name], MD, 1 Units at 06/22/18 1306 .  insulin aspart protamine- aspart (NOVOLOG MIX 70/30) injection 20 Units, 20 Units, Subcutaneous, BID WC, Samuella Cota, MD, 20 Units at 06/22/18 [phone] .  lactated ringers infusion, , Intravenous, Continuous, [name], MD, Last Rate: 10 mL/hr at 06/17/18 0840 .  lactated ringers infusion, , Intravenous, Continuous, [name], MD, Last Rate: 10 mL/hr at 06/19/18 1051 .  methocarbamol (ROBAXIN) tablet 500 mg, 500 mg, Oral, Q6H PRN, 500 mg at 06/21/18 0006 **OR** methocarbamol (ROBAXIN) 500 mg in dextrose 5 % 50 mL IVPB, 500 mg, Intravenous, Q6H PRN, [name], MD .  multivitamin with minerals tablet 1 tablet, 1 tablet, Oral, Daily, [name],  MD, 1 tablet at 06/22/18 0952 .  ondansetron (ZOFRAN) tablet 4 mg, 4 mg, Oral, Q6H PRN **OR** ondansetron (ZOFRAN) injection 4 mg, 4 mg, Intravenous, Q6H PRN, [name], MD, 4 mg at 06/20/18 1032 .  oxyCODONE (Oxy IR/ROXICODONE) immediate release tablet 10-15 mg, 10-15 mg, Oral, Q4H PRN, [name], MD, 10 mg at 06/22/18 0951 .  oxyCODONE (Oxy IR/ROXICODONE) immediate release tablet 5-10 mg, 5-10 mg, Oral, Q4H PRN, [name], MD, 10 mg at 06/21/18 1654 .  senna (SENOKOT) tablet 8.6 mg, 1 tablet, Oral, QHS, [name], MD, 8.6 mg at 06/21/18 2133  Patients Current Diet:  Diet Order             Diet Carb Modified Fluid consistency: Thin; Room service appropriate? Yes  Diet effective now              Precautions / Restrictions Precautions Precautions: Fall Restrictions Weight Bearing Restrictions: Yes RLE Weight Bearing: Non weight bearing Other Position/Activity Restrictions: will be receiving positioning splint from [employer]   Has the patient had 2 or more falls or a fall with injury in the past year?No Had a near fall since admit, therefore needs reassurance and he has a fear of falling Prior Activity Level Limited Community (1-2x/wk): limited since most recent surgery pta  [place] / [place] Devices/Equipment: Radio producer (specify quad or straight) Home Equipment: Walker - 2 wheels, Cane - single point, Bedside commode, Wheelchair - manual  Prior Device Use: Indicate devices/aids used by the patient prior to current illness, exacerbation or injury? Walker  Prior Functional Level Prior Function Level of Independence: Independent with assistive device(s) Comments: cane or RW depending on his abilities, has walked in last few days  Self Care: Did the patient need help bathing, dressing, using the toilet or eating?  Independent  Indoor Mobility: Did the patient need assistance with walking from room to room (with or without device)? Independent  Stairs: Did the patient need assistance with internal or external stairs (with or without device)? Needed some help  Functional Cognition: Did the patient need help planning regular tasks such as shopping or remembering to take medications? Independent  Current Functional Level Cognition  Overall Cognitive Status: Within Functional Limits for tasks assessed Orientation Level: Oriented X4    Extremity Assessment (includes Sensation/Coordination)  Upper Extremity Assessment: Overall WFL for tasks assessed  Lower Extremity Assessment: RLE deficits/detail RLE Deficits / Details: new BK  amputation with NWB on RLE RLE Coordination: decreased gross motor    ADLs       Mobility  Overal bed mobility: Needs Assistance Bed Mobility: Rolling, Supine to Sit Rolling: Supervision Supine to sit: Min assist Sit to supine: Min guard General bed mobility comments: Performed rolling all the way to prone and back with cues for positioning and use of rail prn; min handheld assist to pull to sit    Transfers  Overall transfer level: Needs assistance Equipment used: Rolling walker (2 wheeled) Transfers: Sit to/from Stand, [employer] Transfers Sit to Stand: Mod assist, Min assist Stand pivot transfers: Min assist General transfer comment: mod assist to power up then min assist to steady; practiced sit to stand twice, then stood and pivoted to recliner on pt's L side using RW    Ambulation / Gait / Stairs / Wheelchair Mobility  Ambulation/Gait Ambulation/Gait assistance: Herbalist (Feet): 4 Feet Assistive device: Rolling walker (2 wheeled), 1 person hand held assist General Gait Details: pt stood  side of bed and was unsteady so practiced mult times to get side shift of LLE on RW with min assist, heavy reliance on RW Gait velocity: reduced Gait velocity interpretation: <1.8 ft/sec, indicate of risk for recurrent falls    Posture / Balance Dynamic Sitting Balance Sitting balance - Comments: improved to good after practicing Balance Overall balance assessment: Needs assistance Sitting-balance support: Bilateral upper extremity supported, Single extremity supported Sitting balance-Leahy Scale: Fair Sitting balance - Comments: improved to good after practicing Standing balance support: Bilateral upper extremity supported, During functional activity Standing balance-Leahy Scale: Poor Standing balance comment: working to control balance on RW with LLE which has transmet amputation    Special needs/care consideration BiPAP/CPAP n/a CPM n/a Continuous Drip IV  n/a Dialysis n/a Life Vest n/a Oxygen n/a Special Bed n/a Trach Size n/a Wound Vac right BKA surgical site with wound VAC Skin right BKA surgical site Bowel mgmt: no LBM noted Bladder mgmt: continent Diabetic mgmt Hgb A1c 9/10 was 15.9   Previous Home Environment Living Arrangements: (his two sons who are 46 and 62 years old live with him)  Lives With: (sons) Available Help at Discharge: Family, Available 24 hours/day Type of Home: House Home Layout: One level Home Access: Stairs to enter Entrance Stairs-Rails: Left Entrance Stairs-Number of Steps: 1 Bathroom Shower/Tub: Multimedia programmer: Standard Bathroom Accessibility: Yes How Accessible: Accessible via walker Home Care Services: Yes Type of Home Care Services: [place] (if known): [facility] Additional Comments: pt has been home wiht family around the clock  Discharge Living Setting Plans for Discharge Living Setting: Patient's home, Lives with (comment)(pt has 2 sons who live with him) Type of Home at Discharge: House Discharge Home Layout: One level Discharge Home Access: Stairs to enter Entrance Stairs-Rails: Left Entrance Stairs-Number of Steps: 1 Discharge Bathroom Shower/Tub: Walk-in shower Discharge Bathroom Toilet: Standard Discharge Bathroom Accessibility: Yes How Accessible: Accessible via walker Does the patient have any problems obtaining your medications?: No  Social/Family/Support Systems Patient Roles: Parent Contact Information: sons Anticipated Caregiver: sons Anticipated Caregiver's Contact Information: see above Ability/Limitations of Caregiver: [name] works nights and Jamal works days Caregiver Availability: 24/7 Discharge Plan Discussed with Primary Caregiver: Yes Is Caregiver In Agreement with Plan?: Yes Does Caregiver/Family have Issues with Lodging/Transportation while Pt is in Rehab?: No  Goals/Additional Needs Patient/Family Goal for Rehab: Mod I  to supervision with PT and OT Expected length of stay: ELOS 8 to 12 days Pt/Family Agrees to Admission and willing to participate: Yes Program Orientation Provided & Reviewed with Pt/Caregiver Including Roles  & Responsibilities: Yes  Decrease burden of Care through IP rehab admission: n/a  Possible need for SNF placement upon discharge: not anticipated  Patient Condition: This patient's condition remains as documented in the consult dated 06/22/2018, in which the Rehabilitation Physician determined and documented that the patient's condition is appropriate for intensive rehabilitative care in an inpatient rehabilitation facility. Will admit to inpatient rehab today.  Preadmission Screen Completed By:  Cleatrice Burke, 06/22/2018 2:46 PM ______________________________________________________________________   Discussed status with Dr. Posey Pronto on 06/22/2018 at  1451 and received telephone approval for admission today.  Admission Coordinator:  Cleatrice Burke, time 9480 Date 06/22/2018

## 2018-06-22 NOTE — Clinical Social Work Note (Signed)
CSW acknowledges SNF consult. Patient admitting to CIR today.  CSW signing off.  Dayton Scrape, [place]

## 2018-06-22 NOTE — Plan of Care (Signed)
  Problem: Activity: Goal: Risk for activity intolerance will decrease Outcome: Progressing   Problem: Coping: Goal: Level of anxiety will decrease Outcome: Progressing   Problem: Pain Managment: Goal: General experience of comfort will improve Outcome: Progressing   Problem: Safety: Goal: Ability to remain free from injury will improve Outcome: Progressing   Problem: Skin Integrity: Goal: Risk for impaired skin integrity will decrease Outcome: Progressing   

## 2018-06-22 NOTE — H&P (Signed)
Physical Medicine and Rehabilitation Admission H&P    Chief Complaint  Patient presents with  . Post-op Problem  : HPI: Edward [name] is a 47 year old right-handed male history of type 2 diabetes mellitus, chronic anemia, hypertension, tobacco abuse, left transmetatarsal amputation 05/10/2016.  Per chart review patient lives with 2 sons.  One level home with one-step to entry.  Independent with assistive device prior to admission.  Family assistance as needed.  Presented 06/16/2018 with right foot infection with podiatry follow-up and underwent right fourth ray amputation 06/17/2018 per Dr. Carman Ching.  Postoperatively with extensive infection requiring debridement of which limb could not be salvaged and underwent right transtibial amputation 06/19/2018 per Dr. Sharol Given.  Wound VAC applied.  Hospital course pain management.  [employer] consulted for pre-prosthetic training.  Acute blood loss anemia 10.4.  Therapy evaluations completed with recommendations of physical medicine rehab consult.  Patient was admitted for a comprehensive rehab program.  Review of Systems  Constitutional: Negative for chills and diaphoresis.  HENT: Negative for hearing loss.   Eyes: Negative for blurred vision and double vision.  Respiratory: Negative for cough and shortness of breath.   Cardiovascular: Negative for chest pain and palpitations.  Gastrointestinal: Positive for constipation. Negative for nausea.  Genitourinary: Negative for dysuria, flank pain and hematuria.  Musculoskeletal: Positive for myalgias.  Skin: Negative for rash.  Psychiatric/Behavioral:       Anxiety  All other systems reviewed and are negative.  Past Medical History:  Diagnosis Date  . Anxiety    self reported  . Arthritis   . Depression    self reported  . Diabetes mellitus   . Hypertension    Past Surgical History:  Procedure Laterality Date  . AMPUTATION Left 05/10/2016   Procedure: Left Transmetatarsal Amputation;   Surgeon: [name], MD;  Location: [facility];  Service: Orthopedics;  Laterality: Left;  . AMPUTATION Right 05/16/2018   Procedure: PARTIAL RAY AMPUTATION FIFTH TOE RIGHT FOOT;  Surgeon: [name], DPM;  Location: [facility];  Service: Podiatry;  Laterality: Right;  . AMPUTATION Right 06/17/2018   Procedure: AMPUTATION 4th RAY Right Foot;  Surgeon: Trula Slade, DPM;  Location: [facility];  Service: Podiatry;  Laterality: Right;  . AMPUTATION Right 06/19/2018   Procedure: RIGHT BELOW KNEE AMPUTATION;  Surgeon: [name], MD;  Location: [place] [facility];  Service: Orthopedics;  Laterality: Right;  . APPLICATION OF WOUND VAC Right 06/19/2018   Procedure: APPLICATION OF WOUND VAC;  Surgeon: [name], MD;  Location: [facility];  Service: Orthopedics;  Laterality: Right;  . CIRCUMCISION  09/02/2011   Procedure: CIRCUMCISION ADULT;  Surgeon: Molli Hazard, MD;  Location: [facility];  Service: Urology;  Laterality: N/A;  . I&D EXTREMITY  09/02/2011   Procedure: IRRIGATION AND DEBRIDEMENT EXTREMITY;  Surgeon: Theodoro Kos, DO;  Location: [facility];  Service: Plastics;  Laterality: N/A;  . INCISION AND DRAINAGE OF WOUND  08/12/2011   Procedure: IRRIGATION AND DEBRIDEMENT WOUND;  Surgeon: Zenovia Jarred, MD;  Location: [facility];  Service: General;;  perineum  . IRRIGATION AND DEBRIDEMENT ABSCESS  08/12/2011   Procedure: IRRIGATION AND DEBRIDEMENT ABSCESS;  Surgeon: Molli Hazard, MD;  Location: [facility];  Service: Urology;  Laterality: N/A;  irrigation and debridment perineum    . IRRIGATION AND DEBRIDEMENT ABSCESS  08/14/2011   Procedure: MINOR INCISION AND DRAINAGE OF ABSCESS;  Surgeon: Molli Hazard, MD;  Location: [facility];  Service: Urology;  Laterality: N/A;  Perineal Wound Debridement;Placement Bilateral Testicular Thigh Pouches  . TOTAL HIP ARTHROPLASTY Left 10/11/2016   Procedure: LEFT TOTAL HIP ARTHROPLASTY ANTERIOR APPROACH;  Surgeon: Mcarthur Rossetti, MD;  Location: [facility];  Service: Orthopedics;  Laterality: Left;   Family History  Problem Relation Age of Onset  . Diabetes Other    Social History:  reports that he has been smoking cigarettes. He has never used smokeless tobacco. He reports that he drinks alcohol. He reports that he does not use drugs. Allergies: No Known Allergies Medications Prior to Admission  Medication Sig Dispense Refill  . insulin aspart protamine - aspart (NOVOLOG 70/30 MIX) (70-30) 100 UNIT/ML FlexPen Inject 0.15 mLs (15 Units total) into the skin 2 (two) times daily. 15 mL 4  . lisinopril (PRINIVIL,ZESTRIL) 2.5 MG tablet Take 1 tablet (2.5 mg total) by mouth daily. 30 tablet 0  . sitaGLIPtin-metformin (JANUMET) 50-1000 MG tablet Take 1 tablet by mouth 2 (two) times daily with a meal. 60 tablet 3  . sulfamethoxazole-trimethoprim (BACTRIM DS,SEPTRA DS) 800-160 MG tablet Take 1 tablet by mouth 2 (two) times daily. 28 tablet 0  . traMADol (ULTRAM) 50 MG tablet Take 1 tablet (50 mg total) by mouth every 8 (eight) hours as needed. 60 tablet 0  . ampicillin (PRINCIPEN) 500 MG capsule Take 1 capsule (500 mg total) by mouth 4 (four) times daily. Take 1-2 hours before foot on an empty stomach 40 capsule 0  . blood glucose meter kit and supplies Dispense based on patient and insurance preference. Use up to four times daily as directed. (FOR ICD-9 250.00, 250.01). 1 each 0  . Insulin Pen Needle 31G X 5 MM MISC Use daily to inject insulin as instructed 100 each 2  . lactobacillus acidophilus & bulgar (LACTINEX) chewable tablet Chew 1 tablet by mouth 3 (three) times daily with meals. 60 tablet 0  . magic mouthwash w/lidocaine SOLN Take 5 mLs by mouth 3 (three) times daily as needed for mouth pain. (Patient not taking: Reported on 06/16/2018) 60 mL 0    Drug Regimen Review Drug regimen was reviewed and remains appropriate with no significant issues identified  Home: Home Living Family/patient expects to be  discharged to:: Private residence Living Arrangements: Children Available Help at Discharge: Family, Available 24 hours/day Type of Home: House Home Access: Stairs to enter Technical brewer of Steps: 1 Entrance Stairs-Rails: Left Home Layout: One level Bathroom Shower/Tub: Multimedia programmer: Standard Home Equipment: Environmental consultant - 2 wheels, [employer] - single point, Bedside commode, Wheelchair - manual Additional Comments: pt has been home wiht family around the clock   Functional History: Prior Function Level of Independence: Independent with assistive device(s) Comments: cane or RW depending on his abilities, has walked in last few days  Functional Status:  Mobility: Bed Mobility Overal bed mobility: Needs Assistance Bed Mobility: Rolling, Supine to Sit Rolling: Supervision Supine to sit: Min assist Sit to supine: Min guard General bed mobility comments: Performed rolling all the way to prone and back with cues for positioning and use of rail prn; min handheld assist to pull to sit Transfers Overall transfer level: Needs assistance Equipment used: Rolling walker (2 wheeled) Transfers: Sit to/from Stand, [employer] Transfers Sit to Stand: Mod assist, Min assist Stand pivot transfers: Min assist General transfer comment: mod assist to power up then min assist to steady; practiced sit to stand twice, then stood and pivoted to recliner on pt's L side using RW Ambulation/Gait Ambulation/Gait assistance: Min assist  Gait Distance (Feet): 4 Feet Assistive device: Rolling walker (2 wheeled), 1 person hand held assist General Gait Details: pt stood side of bed and was unsteady so practiced mult times to get side shift of LLE on RW with min assist, heavy reliance on RW Gait velocity: reduced Gait velocity interpretation: <1.8 ft/sec, indicate of risk for recurrent falls    ADL:    Cognition: Cognition Overall Cognitive Status: Within Functional Limits for tasks  assessed Orientation Level: Oriented X4 Cognition Arousal/Alertness: Awake/alert Behavior During Therapy: WFL for tasks assessed/performed Overall Cognitive Status: Within Functional Limits for tasks assessed  Physical Exam: Blood pressure (!) 164/99, pulse (!) 101, temperature 98.2 F (36.8 C), temperature source Oral, resp. rate 18, height 6' 0.99" (1.854 m), weight 98.8 kg, SpO2 98 %. Physical Exam  Constitutional: He is oriented to person, place, and time. He appears well-developed and well-nourished.  HENT:  Head: Normocephalic and atraumatic.  Eyes: EOM are normal. Right eye exhibits no discharge. Left eye exhibits no discharge.  Neck: Normal range of motion. Neck supple. No thyromegaly present.  Cardiovascular: Normal rate and regular rhythm.  Respiratory: Effort normal and breath sounds normal. No respiratory distress.  GI: Soft. Bowel sounds are normal. He exhibits no distension.  Musculoskeletal:  Right BKA with tenderness Left transmet amputation  Neurological: He is alert and oriented to person, place, and time.  Motor: B/l UE 4/5 proximal to distal LLE: 4/5 proximal to distal RLE: HF 4+/5 proximal to distal Sensation diminished to light touch left foot  Skin:  Left transmetatarsal amputation site well-healed.  Right BKA with wound VAC in place appropriately tender  Psychiatric: He has a normal mood and affect. His behavior is normal. Thought content normal.   Results for orders placed or performed during the hospital encounter of 06/16/18 (from the past 48 hour(s))  Glucose, capillary     Status: Abnormal   Collection Time: 06/20/18  4:18 PM  Result Value Ref Range   Glucose-Capillary 168 (H) 70 - 99 mg/dL  Glucose, capillary     Status: None   Collection Time: 06/20/18  9:49 PM  Result Value Ref Range   Glucose-Capillary 76 70 - 99 mg/dL  Glucose, capillary     Status: Abnormal   Collection Time: 06/20/18 11:57 PM  Result Value Ref Range   Glucose-Capillary  105 (H) 70 - 99 mg/dL  Glucose, capillary     Status: Abnormal   Collection Time: 06/21/18  6:45 AM  Result Value Ref Range   Glucose-Capillary 108 (H) 70 - 99 mg/dL  Basic metabolic panel     Status: Abnormal   Collection Time: 06/21/18  8:19 AM  Result Value Ref Range   Sodium 137 135 - 145 mmol/L   Potassium 4.3 3.5 - 5.1 mmol/L   Chloride 107 98 - 111 mmol/L   CO2 20 (L) 22 - 32 mmol/L   Glucose, Bld 126 (H) 70 - 99 mg/dL   BUN 22 (H) 6 - 20 mg/dL   Creatinine, Ser 1.54 (H) 0.61 - 1.24 mg/dL   Calcium 8.2 (L) 8.9 - 10.3 mg/dL   GFR calc non Af Amer 52 (L) >60 mL/min   GFR calc Af Amer 60 (L) >60 mL/min    Comment: (NOTE) The eGFR has been calculated using the CKD EPI equation. This calculation has not been validated in all clinical situations. eGFR's persistently <60 mL/min signify possible Chronic Kidney Disease.    Anion gap 10 5 - 15    Comment: Performed  at [facility], [place] [address] [place], [place] [address]  Glucose, capillary     Status: None   Collection Time: 06/21/18 11:57 AM  Result Value Ref Range   Glucose-Capillary 71 70 - 99 mg/dL  Glucose, capillary     Status: Abnormal   Collection Time: 06/21/18  4:43 PM  Result Value Ref Range   Glucose-Capillary 184 (H) 70 - 99 mg/dL  Glucose, capillary     Status: None   Collection Time: 06/21/18 10:15 PM  Result Value Ref Range   Glucose-Capillary 78 70 - 99 mg/dL   Comment 1 Document in Chart   Glucose, capillary     Status: Abnormal   Collection Time: 06/22/18  6:44 AM  Result Value Ref Range   Glucose-Capillary 159 (H) 70 - 99 mg/dL   Comment 1 Document in Chart   Glucose, capillary     Status: Abnormal   Collection Time: 06/22/18 11:59 AM  Result Value Ref Range   Glucose-Capillary 121 (H) 70 - 99 mg/dL   No results found.     Medical Problem List and Plan: 1.  Decreased functional mobility secondary to right below-knee amputation 06/19/2018 as well as history of left transmetatarsal  amputation 05/10/2016 2.  DVT Prophylaxis/Anticoagulation: SCDs left lower extremity 3. Pain Management: Robaxin and oxycodone as needed 4. Mood: Provide emotional support 5. Neuropsych: This patient is capable of making decisions on his own behalf. 6. Skin/Wound Care: Routine skin checks 7. Fluids/Electrolytes/Nutrition: Routine in and outs with follow-up chemistries 8.  Acute blood loss anemia.  Follow-up CBC 9.  Diabetes mellitus with peripheral neuropathy.  Hemoglobin A1c 15.9.  NovoLog 70/30 20 units twice daily.  Patient also on Janumet 50-1000 mg twice daily prior to admission.  Resume as needed check blood sugars before meals and at bedtime.  Diabetic teaching.  Patient needs blood glucose monitor for [id number] at discharge 10.  Hypertension.  Patient on lisinopril 2.5 mg daily prior to admission.  Resume as needed 11.  Constipation.  Laxative assistance   Post Admission Physician Evaluation: 1. Preadmission assessment reviewed and changes made below. 2. Functional deficits secondary  to right BKA with history of left transmetatarsal amputation. 3. Patient is admitted to receive collaborative, interdisciplinary care between the physiatrist, rehab nursing staff, and therapy team. 4. Patient's level of medical complexity and substantial therapy needs in context of that medical necessity cannot be provided at a lesser intensity of care such as a SNF. 5. Patient has experienced substantial functional loss from his/her baseline which was documented above under the "Functional History" and "Functional Status" headings.  Judging by the patient's diagnosis, physical exam, and functional history, the patient has potential for functional progress which will result in measurable gains while on inpatient rehab.  These gains will be of substantial and practical use upon discharge  in facilitating mobility and self-care at the household level. 6. Physiatrist will provide 24 hour management of medical  needs as well as oversight of the therapy plan/treatment and provide guidance as appropriate regarding the interaction of the two. 7. 24 hour rehab nursing will assist with safety, skin/wound care, disease management, pain management and patient education and help integrate therapy concepts, techniques,education, etc. 8. PT will assess and treat for/with: Lower extremity strength, range of motion, stamina, balance, functional mobility, safety, adaptive techniques and equipment, wound care, coping skills, pain control, education. Goals are: supervision. 9. OT will assess and treat for/with: ADL's, functional mobility, safety, upper extremity strength, adaptive techniques and  equipment, wound mgt, ego support, and community reintegration.   Goals are: supervision. Therapy may not proceed with showering this patient. 10. Case Management and Social Worker will assess and treat for psychological issues and discharge planning. 11. Team conference will be held weekly to assess progress toward goals and to determine barriers to discharge. 12. Patient will receive at least 3 hours of therapy per day at least 5 days per week. 13. ELOS: 8-12 days.       14. Prognosis:  good  I have personally performed a face to face diagnostic evaluation, including, but not limited to relevant history and physical exam findings, of this patient and developed relevant assessment and plan.  Additionally, I have reviewed and concur with the physician assistant's documentation above.  The patient's status has not changed. The original post admission physician evaluation remains appropriate, and any changes from the pre-admission screening or documentation from the acute chart are noted above.    Delice Lesch, MD, ABPMR Lavon Paganini Angiulli, PA-C 06/22/2018

## 2018-06-23 ENCOUNTER — Telehealth (INDEPENDENT_AMBULATORY_CARE_PROVIDER_SITE_OTHER): Payer: Self-pay | Admitting: Orthopedic Surgery

## 2018-06-23 ENCOUNTER — Encounter: Payer: Self-pay | Admitting: Podiatry

## 2018-06-23 ENCOUNTER — Inpatient Hospital Stay (HOSPITAL_COMMUNITY): Payer: Medicaid Other | Admitting: Physical Therapy

## 2018-06-23 ENCOUNTER — Other Ambulatory Visit: Payer: Self-pay

## 2018-06-23 ENCOUNTER — Inpatient Hospital Stay (HOSPITAL_COMMUNITY): Payer: Medicaid Other | Admitting: Occupational Therapy

## 2018-06-23 DIAGNOSIS — E8809 Other disorders of plasma-protein metabolism, not elsewhere classified: Secondary | ICD-10-CM

## 2018-06-23 DIAGNOSIS — N183 Chronic kidney disease, stage 3 (moderate): Secondary | ICD-10-CM

## 2018-06-23 DIAGNOSIS — Z89511 Acquired absence of right leg below knee: Secondary | ICD-10-CM

## 2018-06-23 DIAGNOSIS — E46 Unspecified protein-calorie malnutrition: Secondary | ICD-10-CM

## 2018-06-23 DIAGNOSIS — E1165 Type 2 diabetes mellitus with hyperglycemia: Secondary | ICD-10-CM

## 2018-06-23 DIAGNOSIS — E1142 Type 2 diabetes mellitus with diabetic polyneuropathy: Secondary | ICD-10-CM

## 2018-06-23 DIAGNOSIS — Z89432 Acquired absence of left foot: Secondary | ICD-10-CM

## 2018-06-23 DIAGNOSIS — S88111D Complete traumatic amputation at level between knee and ankle, right lower leg, subsequent encounter: Secondary | ICD-10-CM

## 2018-06-23 DIAGNOSIS — E1122 Type 2 diabetes mellitus with diabetic chronic kidney disease: Secondary | ICD-10-CM

## 2018-06-23 LAB — CBC WITH DIFFERENTIAL/PLATELET
Abs Immature Granulocytes: 0.16 10*3/uL — ABNORMAL HIGH (ref 0.00–0.07)
BASOS ABS: 0.1 10*3/uL (ref 0.0–0.1)
Basophils Relative: 1 %
EOS ABS: 0.4 10*3/uL (ref 0.0–0.5)
Eosinophils Relative: 5 %
HCT: 30.3 % — ABNORMAL LOW (ref 39.0–52.0)
Hemoglobin: 9.3 g/dL — ABNORMAL LOW (ref 13.0–17.0)
IMMATURE GRANULOCYTES: 2 %
Lymphocytes Relative: 28 %
Lymphs Abs: 2.1 10*3/uL (ref 0.7–4.0)
MCH: 25.9 pg — ABNORMAL LOW (ref 26.0–34.0)
MCHC: 30.7 g/dL (ref 30.0–36.0)
MCV: 84.4 fL (ref 80.0–100.0)
MONOS PCT: 10 %
Monocytes Absolute: 0.8 10*3/uL (ref 0.1–1.0)
NEUTROS PCT: 54 %
NRBC: 0 % (ref 0.0–0.2)
Neutro Abs: 4.1 10*3/uL (ref 1.7–7.7)
PLATELETS: 508 10*3/uL — AB (ref 150–400)
RBC: 3.59 MIL/uL — ABNORMAL LOW (ref 4.22–5.81)
RDW: 12.5 % (ref 11.5–15.5)
WBC: 7.5 10*3/uL (ref 4.0–10.5)

## 2018-06-23 LAB — COMPREHENSIVE METABOLIC PANEL
ALT: 12 U/L (ref 0–44)
AST: 14 U/L — AB (ref 15–41)
Albumin: 2.1 g/dL — ABNORMAL LOW (ref 3.5–5.0)
Alkaline Phosphatase: 54 U/L (ref 38–126)
Anion gap: 6 (ref 5–15)
BUN: 22 mg/dL — AB (ref 6–20)
CHLORIDE: 106 mmol/L (ref 98–111)
CO2: 23 mmol/L (ref 22–32)
Calcium: 8.2 mg/dL — ABNORMAL LOW (ref 8.9–10.3)
Creatinine, Ser: 1.52 mg/dL — ABNORMAL HIGH (ref 0.61–1.24)
GFR, EST NON AFRICAN AMERICAN: 53 mL/min — AB (ref >60)
Glucose, Bld: 111 mg/dL — ABNORMAL HIGH (ref 70–99)
Potassium: 4.9 mmol/L (ref 3.5–5.1)
Sodium: 135 mmol/L (ref 135–145)
Total Bilirubin: 0.5 mg/dL (ref 0.3–1.2)
Total Protein: 5.7 g/dL — ABNORMAL LOW (ref 6.5–8.1)

## 2018-06-23 LAB — GLUCOSE, CAPILLARY
Glucose-Capillary: 128 mg/dL — ABNORMAL HIGH (ref 70–99)
Glucose-Capillary: 83 mg/dL (ref 70–99)
Glucose-Capillary: 226 mg/dL — ABNORMAL HIGH (ref 70–99)
Glucose-Capillary: 201 mg/dL — ABNORMAL HIGH (ref 70–99)

## 2018-06-23 NOTE — Progress Notes (Signed)
Inpatient Rehabilitation  Patient information reviewed and entered into eRehab system by Davontae Prusinski M. Jaquille Kau, M.A., CCC/SLP, PPS Coordinator.  Information including medical coding, functional ability and quality indicators will be reviewed and updated through discharge.    

## 2018-06-23 NOTE — Progress Notes (Signed)
Physical Therapy Assessment and Plan  Patient Details  Name: Edward [name] MRN: [id number] Date of Birth: April 28, 1971  PT Diagnosis: Abnormality of gait, Difficulty walking, Impaired sensation and Pain in R residual limb Rehab Potential: Excellent ELOS: 10-12 days   Today's Date: 06/23/2018 PT Individual Time: 0800-0900 PT Individual Time Calculation (min): 60 min    Problem List:  Patient Active Problem List   Diagnosis Date Noted  . History of transmetatarsal amputation of left foot ([place])   . Hypoalbuminemia due to protein-calorie malnutrition ([place])   . Stage 3 chronic kidney disease ([place])   . Poorly controlled type 2 diabetes mellitus with peripheral neuropathy ([place])   . Unilateral complete BKA, right, subsequent encounter ([place])   . Right below-knee amputee ([place]) 06/22/2018  . Acute blood loss anemia   . Post-operative pain   . Anemia of chronic disease   . Leukocytosis   . Unilateral complete BKA, right, initial encounter ([place])   . Tobacco abuse   . Hyperkalemia 06/20/2018  . Diabetic polyneuropathy associated with type 2 diabetes mellitus ([place])   . Severe protein-calorie malnutrition ([place])   . AKI (acute kidney injury) ([place]) 06/17/2018  . Normocytic anemia 06/17/2018  . Osteomyelitis of right foot ([place]) 05/14/2018  . DM2 (diabetes mellitus, type 2) ([place]) 05/14/2018  . Viral pharyngitis 01/14/2017  . Cigarette smoker 01/14/2017  . Elevated blood pressure reading 01/14/2017  . Avascular necrosis of hip, left ([place]) 10/11/2016  . Status post left hip replacement 10/11/2016  . Unilateral primary osteoarthritis, left hip 09/18/2016  . S/P transmetatarsal amputation of foot, left ([place]) 07/25/2016  . Balance problem 07/25/2016  . Sepsis ([place]) 05/07/2016  . Fournier's gangrene 08/16/2011  . Acute respiratory failure ([place]) 08/16/2011  . Septic shock(785.52) 08/16/2011  . Diabetic osteomyelitis ([place]) 08/16/2011    Past Medical History:  Past Medical History:  Diagnosis  Date  . Anxiety    self reported  . Arthritis   . Depression    self reported  . Diabetes mellitus   . Hypertension    Past Surgical History:  Past Surgical History:  Procedure Laterality Date  . AMPUTATION Left 05/10/2016   Procedure: Left Transmetatarsal Amputation;  Surgeon: Edward Minion, MD;  Location: [facility];  Service: Orthopedics;  Laterality: Left;  . AMPUTATION Right 05/16/2018   Procedure: PARTIAL RAY AMPUTATION FIFTH TOE RIGHT FOOT;  Surgeon: Edward [name], DPM;  Location: [place];  Service: Podiatry;  Laterality: Right;  . AMPUTATION Right 06/17/2018   Procedure: AMPUTATION 4th RAY Right Foot;  Surgeon: Edward [name], DPM;  Location: [facility];  Service: Podiatry;  Laterality: Right;  . AMPUTATION Right 06/19/2018   Procedure: RIGHT BELOW KNEE AMPUTATION;  Surgeon: Edward Minion, MD;  Location: [facility];  Service: Orthopedics;  Laterality: Right;  . APPLICATION OF WOUND VAC Right 06/19/2018   Procedure: APPLICATION OF WOUND VAC;  Surgeon: Edward Minion, MD;  Location: [facility];  Service: Orthopedics;  Laterality: Right;  . CIRCUMCISION  09/02/2011   Procedure: CIRCUMCISION ADULT;  Surgeon: Edward Hazard, MD;  Location: [facility];  Service: Urology;  Laterality: N/A;  . I&D EXTREMITY  09/02/2011   Procedure: IRRIGATION AND DEBRIDEMENT EXTREMITY;  Surgeon: Edward Kos, DO;  Location: [facility];  Service: Plastics;  Laterality: N/A;  . INCISION AND DRAINAGE OF WOUND  08/12/2011   Procedure: IRRIGATION AND DEBRIDEMENT WOUND;  Surgeon: Edward Jarred, MD;  Location: [facility];  Service: General;;  perineum  .  IRRIGATION AND DEBRIDEMENT ABSCESS  08/12/2011   Procedure: IRRIGATION AND DEBRIDEMENT ABSCESS;  Surgeon: Edward Hazard, MD;  Location: [facility];  Service: Urology;  Laterality: N/A;  irrigation and debridment perineum    . IRRIGATION AND DEBRIDEMENT ABSCESS  08/14/2011   Procedure: MINOR INCISION AND DRAINAGE OF ABSCESS;  Surgeon: Edward Hazard, MD;  Location: [facility];  Service: Urology;  Laterality: N/A;  Perineal Wound Debridement;Placement Bilateral Testicular Thigh Pouches  . TOTAL HIP ARTHROPLASTY Left 10/11/2016   Procedure: LEFT TOTAL HIP ARTHROPLASTY ANTERIOR APPROACH;  Surgeon: Edward Rossetti, MD;  Location: [facility];  Service: Orthopedics;  Laterality: Left;    Assessment & Plan Clinical Impression: Edward [name] is a 47 year old right-handed male history of type 2 diabetes mellitus, chronic anemia, hypertension, tobacco abuse, left transmetatarsal amputation 05/10/2016.  Per chart review patient lives with 2 sons.  One level home with one-step to entry.  Independent with assistive device prior to admission.  Family assistance as needed.  Presented 06/16/2018 with right foot infection with podiatry follow-up and underwent right fourth ray amputation 06/17/2018 per Dr. Carman [name].  Postoperatively with extensive infection requiring debridement of which limb could not be salvaged and underwent right transtibial amputation 06/19/2018 per Dr. Sharol [name].  Wound VAC applied.  Hospital course pain management.  [employer] consulted for pre-prosthetic training.  Acute blood loss anemia 10.4.  Therapy evaluations completed with recommendations of physical medicine rehab consult.  Patient was admitted for a comprehensive rehab program.  Patient currently requires min with mobility secondary to muscle weakness, decreased cardiorespiratoy endurance, impaired timing and sequencing and decreased coordination, phantom limb sensation and decreased standing balance, decreased postural control and decreased balance strategies Prior to hospitalization, patient was modified independent  with mobility and lived with Son(2 sons (age 37 and 35)) in a House home.  Home access is 1Stairs to enter.  Patient will benefit from skilled PT intervention to maximize safe functional mobility, minimize fall risk and decrease caregiver burden for  planned discharge home with intermittent assist.  Anticipate patient will benefit from follow up [facility] at discharge.  PT - End of Session Activity Tolerance: Tolerates 10 - 20 min activity with multiple rests Endurance Deficit: Yes Endurance Deficit Description: decreased endurance with gait/stair navigation and w/c propulsion  PT Assessment Rehab Potential (ACUTE/IP ONLY): Excellent PT Barriers to Discharge: Weight bearing restrictions PT Barriers to Discharge Comments: R LE NWB PT Patient demonstrates impairments in the following area(s): Balance;Endurance;Motor;Safety;Pain PT Transfers Functional Problem(s): Bed to Chair;Car;Furniture;Bed Mobility;Floor PT Locomotion Functional Problem(s): Ambulation;Wheelchair Mobility;Stairs PT Plan PT Intensity: Minimum of 1-2 x/day ,45 to 90 minutes PT Frequency: 5 out of 7 days PT Duration Estimated Length of Stay: 10-12 days PT Treatment/Interventions: Ambulation/gait training;Discharge planning;DME/adaptive equipment instruction;Functional mobility training;Pain management;Psychosocial support;Splinting/orthotics;Therapeutic Activities;UE/LE Strength taining/ROM;Balance/vestibular training;Community reintegration;Disease management/prevention;Neuromuscular re-education;Patient/family education;Skin care/wound management;Stair training;Therapeutic Exercise;UE/LE Coordination activities;Wheelchair propulsion/positioning PT Transfers Anticipated Outcome(s): modI PT Locomotion Anticipated Outcome(s): modI PT Recommendation Recommendations for Other Services: Neuropsych consult Follow Up Recommendations: Home health PT Patient destination: Home Equipment Recommended: Other (comment)(R amputee pad) Equipment Details: has w/c, RW, rollator  Skilled Therapeutic Intervention Pt received laying in bed with LPN present (adminstering meds). Pain 5/10 as described below and agreeable to PT services; reports phantom limb sensation. Balance, locomotion,  sensation, motor, mobility, postural control, and strength assessed as described below. Pt educated on ELOS, POC, rehab potential, and positioning of residual limb for contracture prevention and to prevent edema/swelling in residual limb. PT sessions should focus onbalance, endurance, LE  strengthening, and reducing fear for improved functional mobility. Pt remained sitting in w/c with all needs in reach at the end of the session.  PT Evaluation Precautions/Restrictions Precautions Precautions: Fall Required Braces or Orthoses: Other Brace/Splint Other Brace/Splint: Leg guard/stump protector/positioning splint Restrictions Weight Bearing Restrictions: Yes RLE Weight Bearing: Non weight bearing General Chart Reviewed: Yes Additional Pertinent History: L transmetatarsal amputation, type II DM, HTN, tobacco user Family/Caregiver Present: No  Pain Pain Assessment Pain Scale: 0-10 Pain Score: 5  Pain Type: Surgical pain;Phantom pain Pain Location: Leg Pain Orientation: Right Pain Descriptors / Indicators: Aching;Other (Comment);Nagging("itching") Pain Frequency: Intermittent Pain Onset: On-going Pain Intervention(s): MD notified (Comment);Elevated extremity Multiple Pain Sites: No Home Living/Prior Functioning Home Living Available Help at Discharge: Family;Available 24 hours/day Type of Home: House Home Access: Stairs to enter [employer] of Steps: 1 Entrance Stairs-Rails: None Home Layout: One level Bathroom Shower/Tub: Multimedia programmer: Standard Bathroom Accessibility: Yes  Lives With: Son(2 sons (age 52 and 102)) Prior Function Level of Independence: Requires assistive device for independence(has been using RW; also has SPC, rollator, and manual w/c)  Able to Take Stairs?: Yes Vision/Perception  Perception Perception: Within Functional Limits Praxis Praxis: Intact  Cognition Overall Cognitive Status: Within Functional Limits for tasks  assessed Arousal/Alertness: Awake/alert Orientation Level: Oriented X4 Attention: Sustained;Selective;Focused Focused Attention: Appears intact Sustained Attention: Appears intact Selective Attention: Appears intact Memory: Appears intact Awareness: Appears intact Problem Solving: Appears intact Safety/Judgment: Appears intact Sensation Sensation Light Touch: Impaired by gross assessment Additional Comments: hypersensitivity in R residual limb; numbness in L foot due to L metatarsal amputation in 04/2016 Coordination Gross Motor Movements are Fluid and Coordinated: No Fine Motor Movements are Fluid and Coordinated: Not tested Coordination and Movement Description: uncoordinated movements due to R BKA, L transmetatarsal amputation, poor balance, fear of falling, LE weakness Motor  Motor Motor: Within Functional Limits Motor - Skilled Clinical Observations: Inreased time during functional transfers due to R BKA, poor balance, fear of falling  Mobility Bed Mobility Bed Mobility: Rolling Right;Rolling Left;Right Sidelying to Sit;Sitting - Scoot to [employer] of Bed;Sit to Sidelying Left Rolling Right: Independent Rolling Left: Independent Right Sidelying to Sit: Independent Sitting - Scoot to [employer] of Bed: Independent Transfers Transfers: Sit to Stand;Stand to [employer] Pivot Transfers Sit to Stand: Minimal Assistance - Patient > 75% Stand to Sit: Minimal Assistance - Patient > 75% Stand Pivot Transfers: Minimal Assistance - Patient > 75% Stand Pivot Transfer Details: Verbal cues for technique;Verbal cues for sequencing;Verbal cues for precautions/safety;Verbal cues for safe use of DME/AE;Manual facilitation for placement Squat Pivot Transfers: Minimal Assistance - Patient > 75% Transfer (Assistive device): Rolling walker(for stand pivot transfers) Locomotion  Gait Ambulation: Yes Gait Assistance: Contact Guard/Touching assist Gait Distance (Feet): 10  Feet Assistive device: Rolling walker Gait Gait: Yes Gait Pattern: Impaired Gait Pattern: ("hopping" due to R BKA) Gait velocity: slow Stairs / Additional Locomotion Stairs: Yes Stairs Assistance: Contact Guard/Touching assist Stair Management Technique: Two rails Number of Stairs: 5 Height of Stairs: 3 Curb: Nurse, mental health Mobility: Yes Wheelchair Assistance: Chartered loss adjuster: Both upper extremities Wheelchair Parts Management: Needs assistance Distance: 150  Trunk/Postural Assessment  Cervical Assessment Cervical Assessment: Within Functional Limits Thoracic Assessment Thoracic Assessment: Within Functional Limits Lumbar Assessment Lumbar Assessment: Within Functional Limits Postural Control Postural Control: Deficits on evaluation(decreased due to R BKA, phantom limb sensation, fear of falling)  Balance Balance Balance Assessed: Yes Static Sitting Balance Static Sitting - Balance Support: Bilateral  upper extremity supported;Feet supported;No upper extremity supported Static Sitting - Level of Assistance: 7: Independent Dynamic Sitting Balance Dynamic Sitting - Balance Support: Feet supported;During functional activity;Bilateral upper extremity supported Dynamic Sitting - Level of Assistance: 7: Independent Dynamic Sitting - Balance Activities: Forward lean/weight shifting;Lateral lean/weight shifting Sitting balance - Comments: able to perform upper body dressing Static Standing Balance Static Standing - Balance Support: Bilateral upper extremity supported;During functional activity Static Standing - Level of Assistance: 5: Stand by assistance Dynamic Standing Balance Dynamic Standing - Balance Support: During functional activity;Bilateral upper extremity supported Dynamic Standing - Level of Assistance: 5: Stand by assistance Dynamic Standing - Balance Activities: Forward lean/weight  shifting;Lateral lean/weight shifting Extremity Assessment  RUE Assessment RUE Assessment: Within Functional Limits LUE Assessment LUE Assessment: Within Functional Limits RLE Assessment RLE Assessment: Exceptions to [facility] RLE Strength Right Hip Flexion: 4+/5 Right Hip ABduction: 4+/5 Right Hip ADduction: 4+/5 Right Knee Flexion: 4-/5 Right Knee Extension: 4-/5 LLE Assessment LLE Assessment: Exceptions to [facility] LLE Strength Left Hip Flexion: 4+/5 Left Hip ABduction: 4+/5 Left Hip ADduction: 4+/5 Left Knee Flexion: 4/5 Left Knee Extension: 4/5 Left Ankle Dorsiflexion: 4/5 Left Ankle Plantar Flexion: 4+/5    Refer to Care Plan for Long Term Goals  Recommendations for other services: Neuropsych  Discharge Criteria: Patient will be discharged from PT if patient refuses treatment 3 consecutive times without medical reason, if treatment goals not met, if there is a change in medical status, if patient makes no progress towards goals or if patient is discharged from hospital.  The above assessment, treatment plan, treatment alternatives and goals were discussed and mutually agreed upon: by patient  [place] Suleima Ohlendorf 06/23/2018, 9:55 AM

## 2018-06-23 NOTE — Progress Notes (Signed)
   06/23/18 1100  Clinical Encounter Type  Visited With Patient  Responded to [facility] consult for AD. Patient just wanted the AD process explained. Michela Pitcher he will consider it but do not need at this time.

## 2018-06-23 NOTE — Progress Notes (Signed)
Physical Therapy Session Note  Patient Details  Name: Edward [name] MRN: [id number] Date of Birth: 10-27-1970  Today's Date: 06/23/2018 PT Individual Time: 0935-1029 PT Individual Time Calculation (min): 54 min   Short Term Goals: Week 1:  PT Short Term Goal 1 (Week 1): = LTGs due to LOS  Skilled Therapeutic Interventions/Progress Updates:  Session focused on therex for residual limb strength and ROM.  Pt performs transfers with squat pivot with min A throughout session, w/c mobility with supervision, assist for parts management.  Supine therex 2 x 15 SLR, SAQ, hip abd/add, add squeeze, glute squeeze.  sidelying clamshells, hip abd, hip ext.  Pt able to lie prone x 6 minutes with 2 x 10 prone HS curls, unable to tolerate prone hip extension due to back pain.  Seated trunk diagonals with 4# med ball 2 x 15.  Pt left in bed with alarm set, needs at hand.  Therapy Documentation Precautions:  Precautions Precautions: Fall Required Braces or Orthoses: Other Brace/Splint Other Brace/Splint: Leg guard/stump protector/positioning splint Restrictions Weight Bearing Restrictions: Yes RLE Weight Bearing: Non weight bearing Pain: Pt c/o residual limb pain, rest and repositioned as necessary      Therapy/Group: Individual Therapy  [name] 06/23/2018, 10:30 AM

## 2018-06-23 NOTE — Progress Notes (Signed)
Social Work  Social Work Assessment and Plan  Patient Details  Name: Edward [name] MRN: [id number] Date of Birth: [birth year]  Today's Date: 06/23/2018  Problem List:  Patient Active Problem List   Diagnosis Date Noted  . History of transmetatarsal amputation of left foot ([place])   . Hypoalbuminemia due to protein-calorie malnutrition ([place])   . Stage 3 chronic kidney disease ([place])   . Poorly controlled type 2 diabetes mellitus with peripheral neuropathy ([place])   . Unilateral complete BKA, right, subsequent encounter ([place])   . Right below-knee amputee ([place]) 06/22/2018  . Acute blood loss anemia   . Post-operative pain   . Anemia of chronic disease   . Leukocytosis   . Unilateral complete BKA, right, initial encounter ([place])   . Tobacco abuse   . Hyperkalemia 06/20/2018  . Diabetic polyneuropathy associated with type 2 diabetes mellitus ([place])   . Severe protein-calorie malnutrition ([place])   . AKI (acute kidney injury) ([place]) 06/17/2018  . Normocytic anemia 06/17/2018  . Osteomyelitis of right foot ([place]) 05/14/2018  . DM2 (diabetes mellitus, type 2) ([place]) 05/14/2018  . Viral pharyngitis 01/14/2017  . Cigarette smoker 01/14/2017  . Elevated blood pressure reading 01/14/2017  . Avascular necrosis of hip, left ([place]) 10/11/2016  . Status post left hip replacement 10/11/2016  . Unilateral primary osteoarthritis, left hip 09/18/2016  . S/P transmetatarsal amputation of foot, left ([place]) 07/25/2016  . Balance problem 07/25/2016  . Sepsis ([place]) 05/07/2016  . Fournier's gangrene 08/16/2011  . Acute respiratory failure ([place]) 08/16/2011  . Septic shock(785.52) 08/16/2011  . Diabetic osteomyelitis ([place]) 08/16/2011   Past Medical History:  Past Medical History:  Diagnosis Date  . Anxiety    self reported  . Arthritis   . Depression    self reported  . Diabetes mellitus   . Hypertension    Past Surgical History:  Past Surgical History:  Procedure Laterality Date  . AMPUTATION Left  05/10/2016   Procedure: Left Transmetatarsal Amputation;  Surgeon: [name], MD;  Location: [facility];  Service: Orthopedics;  Laterality: Left;  . AMPUTATION Right 05/16/2018   Procedure: PARTIAL RAY AMPUTATION FIFTH TOE RIGHT FOOT;  Surgeon: [name], DPM;  Location: [facility];  Service: Podiatry;  Laterality: Right;  . AMPUTATION Right 06/17/2018   Procedure: AMPUTATION 4th RAY Right Foot;  Surgeon: Trula Slade, DPM;  Location: [facility];  Service: Podiatry;  Laterality: Right;  . AMPUTATION Right 06/19/2018   Procedure: RIGHT BELOW KNEE AMPUTATION;  Surgeon: [name], MD;  Location: [facility];  Service: Orthopedics;  Laterality: Right;  . APPLICATION OF WOUND VAC Right 06/19/2018   Procedure: APPLICATION OF WOUND VAC;  Surgeon: [name], MD;  Location: [facility];  Service: Orthopedics;  Laterality: Right;  . CIRCUMCISION  09/02/2011   Procedure: CIRCUMCISION ADULT;  Surgeon: Molli Hazard, MD;  Location: [facility];  Service: Urology;  Laterality: N/A;  . I&D EXTREMITY  09/02/2011   Procedure: IRRIGATION AND DEBRIDEMENT EXTREMITY;  Surgeon: Theodoro Kos, DO;  Location: [facility];  Service: Plastics;  Laterality: N/A;  . INCISION AND DRAINAGE OF WOUND  08/12/2011   Procedure: IRRIGATION AND DEBRIDEMENT WOUND;  Surgeon: Zenovia Jarred, MD;  Location: [facility];  Service: General;;  perineum  . IRRIGATION AND DEBRIDEMENT ABSCESS  08/12/2011   Procedure: IRRIGATION AND DEBRIDEMENT ABSCESS;  Surgeon: Molli Hazard, MD;  Location: [facility];  Service: Urology;  Laterality: N/A;  irrigation and debridment perineum    .  IRRIGATION AND DEBRIDEMENT ABSCESS  08/14/2011   Procedure: MINOR INCISION AND DRAINAGE OF ABSCESS;  Surgeon: Molli Hazard, MD;  Location: [facility];  Service: Urology;  Laterality: N/A;  Perineal Wound Debridement;Placement Bilateral Testicular Thigh Pouches  . TOTAL HIP ARTHROPLASTY Left 10/11/2016   Procedure: LEFT TOTAL HIP  ARTHROPLASTY ANTERIOR APPROACH;  Surgeon: Mcarthur Rossetti, MD;  Location: [facility];  Service: Orthopedics;  Laterality: Left;   Social History:  reports that he has been smoking cigarettes. He has never used smokeless tobacco. He reports that he drinks alcohol. He reports that he does not use drugs.  Family / Support Systems Marital Status: Widow/Widower Patient Roles: Parent Children: Edward [name] [phone] Edward [name] [phone] Other Supports: Friends Anticipated Caregiver: Son's Ability/Limitations of Caregiver: Edward [name] works days and Jamal works nights someone is always in the house Caregiver Availability: 24/7 Family Dynamics: Close knit with his boys and they all rely upon one another. He is grateful to have them and they assist when they need too. Pt is one who wnats to be independent and take care of himself he will hopefully from here.  Social History Preferred language: English Religion: Christian Cultural Background: No issues Education: High School Read: Yes Write: Yes Employment Status: Disabled Freight forwarder Issues: No issues Guardian/Conservator: none-according to MD pt is capable of making his own decisions while here   Abuse/Neglect Abuse/Neglect Assessment Can Be Completed: Yes Physical Abuse: Denies Verbal Abuse: Denies Sexual Abuse: Denies Exploitation of patient/patient's resources: Denies Self-Neglect: Denies  Emotional Status Pt's affect, behavior adn adjustment status: Pt is motivated to do well and become independent again. He is glad to here on the rehab unit where he will get the therapy he needs. He is tired from therapies this am, so therapists plan to spread out his therapies. Recent Psychosocial Issues: other health issues-were being managed Pyschiatric History: History of depression/anxiety as not taking any medications prior to admission. Would benefit from seeing neuro-psych while here. Will make referral to be  seen Substance Abuse History: Tobacco aware of the recommdation to quit, he will work on this. Aware of community resources to assist with this  Patient / Family Perceptions, Expectations & Goals Pt/Family understanding of illness & functional limitations: Pt can explain his amputation and treatment plan going forward, he talks with the MD daily and feels he has a good understanding of this. His son's talk with the MD also.  Premorbid pt/family roles/activities: Father, retiree, friend, etc Anticipated changes in roles/activities/participation: resume Pt/family expectations/goals: Pt states: " I want to be able to take care of myself before I leave here."  Son states: " We will assist if he needs it and lets [place]." " He would for [place]."  [employer]: Other (Comment) Premorbid Home Care/DME Agencies: Other (Comment)(has rw, bsc, wc from past admits) Transportation available at discharge: [employer] referrals recommended: Neuropsychology, Support group (specify)  Discharge Planning Living Arrangements: Children Support Systems: Children, Water engineer, Other relatives Type of Residence: Private residence Insurance Resources: [employer] (specify county) Museum/gallery curator Resources: Family Support, SSI Financial Screen Referred: Previously completed Living Expenses: Rent Money Management: Patient Does the patient have any problems obtaining your medications?: No Home Management: All of them assist with this Patient/Family Preliminary Plans: Return home with two son's both of which can assist if needed. Both work different shifts and someone is always in the home, maybe sleeping but there. Pt should do well here and be here a short time-7-8 days. Await therapy team evaluations. Social Work Anticipated  Follow Up Needs: HH/OP, Support Group  Clinical Impression Pleasant gentleman who is motivated to do well and regain his independence while here. His two son's are involved  and will assist him. Await team's evaluations and work on discharge needs, would benefit from seeing neuro-psych while here, will make referral.   Mel Langan, Gardiner Rhyme 06/23/2018, 10:54 AM

## 2018-06-23 NOTE — Progress Notes (Signed)
[facility] PHYSICAL MEDICINE & REHABILITATION PROGRESS NOTE  Subjective/Complaints: Patient seen sitting up in bed this morning.  He states he slept well overnight.  He states he is ready to begin therapies.  ROS: Denies CP, shortness of breath, nausea, vomiting.  Objective: Vital Signs: Blood pressure (!) 161/97, pulse 92, temperature 98.9 F (37.2 C), temperature source Oral, resp. rate 13, SpO2 100 %. No results found. Recent Labs    06/23/18 0505  WBC 7.5  HGB 9.3*  HCT 30.3*  PLT 508*   Recent Labs    06/21/18 0819 06/23/18 0505  NA 137 135  K 4.3 4.9  CL 107 106  CO2 20* 23  GLUCOSE 126* 111*  BUN 22* 22*  CREATININE 1.54* 1.52*  CALCIUM 8.2* 8.2*    Physical Exam: BP (!) 161/97 (BP Location: Right Arm)   Pulse 92   Temp 98.9 F (37.2 C) (Oral)   Resp 13   SpO2 100%  Constitutional: Well-developedand well nourished. NAD. HENT: Normocephalicand atraumatic.  Eyes:EOMI. No discharge.  Cardiovascular:Normal rateand regular rhythm. No JVD. Respiratory:Effort normaland breath sounds normal.  GI: Bowel sounds are normal. He exhibitsno distension.  Musculoskeletal:Right BKA with tenderness Left transmet amputation Neurological: He isalertand oriented. Motor: B/l UE 4/5 proximal to distal, stable LLE: 4/5 proximal to distal, stable RLE: HF 4+/5 proximal to distal Skin:Left transmetatarsal amputation site well-healed.  Right BKA with wound VAC Psychiatric: He has anormal mood and affect. Hisbehavior is normal.Thought contentnormal.   Assessment/Plan: 1. Functional deficits secondary to right BKA left transmetatarsal amputation which require 3+ hours per day of interdisciplinary therapy in a comprehensive inpatient rehab setting.  Physiatrist is providing close team supervision and 24 hour management of active medical problems listed below.  Physiatrist and rehab team continue to assess barriers to discharge/monitor patient progress toward  functional and medical goals  Care Tool:  Bathing              Bathing assist       Upper Body Dressing/Undressing Upper body dressing        Upper body assist      Lower Body Dressing/Undressing Lower body dressing            Lower body assist       Toileting Toileting    Toileting assist Assist for toileting: Moderate Assistance - Patient 50 - 74%     Transfers Chair/bed transfer  Transfers assist     Chair/bed transfer assist level: Moderate Assistance - Patient 50 - 74%(RW)     Locomotion Ambulation   Ambulation assist              Walk 10 feet activity   Assist           Walk 50 feet activity   Assist           Walk 150 feet activity   Assist           Walk 10 feet on uneven surface  activity   Assist           Wheelchair     Assist               Wheelchair 50 feet with 2 turns activity    Assist            Wheelchair 150 feet activity     Assist            Medical Problem List and Plan: 1.  Decreased functional mobility secondary to  right below-knee amputation 06/19/2018 as well as history of left transmetatarsal amputation 05/10/2016  Begin CIR 2.  DVT Prophylaxis/Anticoagulation: SCDs left lower extremity 3. Pain Management: Robaxin and oxycodone as needed 4. Mood: Provide emotional support 5. Neuropsych: This patient is capable of making decisions on his own behalf. 6. Skin/Wound Care: Routine skin checks 7. Fluids/Electrolytes/Nutrition: Routine in and outs 8.  Acute blood loss anemia.    Hemoglobin 9.3 on 11/5 9.  Diabetes mellitus with peripheral neuropathy.  Hemoglobin A1c 15.9.  NovoLog 70/30 20 units twice daily.  Patient also on Janumet 50-1000 mg twice daily prior to admission.  Resume as needed check blood sugars before meals and at bedtime.  Diabetic teaching.  Patient needs blood glucose monitor at discharge  Monitor with increased mobility 10.   Hypertension.  Patient on lisinopril 2.5 mg daily prior to admission.  Resume as needed  Monitor with increased mobility 11.  Constipation.  Laxative assistance 12.  CKD  Creatinine 1.52 on 11/5  Continue to monitor 15.  Hypoalbuminemia  Supplement initiated on 11/5  LOS: 1 days A FACE TO FACE EVALUATION WAS PERFORMED  Velena Keegan Lorie Phenix 06/23/2018, 9:00 AM

## 2018-06-23 NOTE — Telephone Encounter (Signed)
Patient called stated still in hospital and don't know when to r/s hosp f/u. It was scheduled for 11/8.  Please call pt at [phone]

## 2018-06-23 NOTE — Evaluation (Signed)
Occupational Therapy Assessment and Plan  Patient Details  Name: Edward [name] MRN: [id number] Date of Birth: 02-May-1971  OT Diagnosis: acute pain and muscle weakness (generalized) Rehab Potential: Rehab Potential (ACUTE ONLY): Good ELOS: 10-12 days   Today's Date: 06/23/2018 OT Individual Time: 8366-2947 OT Individual Time Calculation (min): 68 min     Problem List:  Patient Active Problem List   Diagnosis Date Noted  . History of transmetatarsal amputation of left foot ([place])   . Hypoalbuminemia due to protein-calorie malnutrition ([place])   . Stage 3 chronic kidney disease ([place])   . Poorly controlled type 2 diabetes mellitus with peripheral neuropathy ([place])   . Unilateral complete BKA, right, subsequent encounter ([place])   . Right below-knee amputee ([place]) 06/22/2018  . Acute blood loss anemia   . Post-operative pain   . Anemia of chronic disease   . Leukocytosis   . Unilateral complete BKA, right, initial encounter ([place])   . Tobacco abuse   . Hyperkalemia 06/20/2018  . Diabetic polyneuropathy associated with type 2 diabetes mellitus ([place])   . Severe protein-calorie malnutrition ([place])   . AKI (acute kidney injury) ([place]) 06/17/2018  . Normocytic anemia 06/17/2018  . Osteomyelitis of right foot ([place]) 05/14/2018  . DM2 (diabetes mellitus, type 2) ([place]) 05/14/2018  . Viral pharyngitis 01/14/2017  . Cigarette smoker 01/14/2017  . Elevated blood pressure reading 01/14/2017  . Avascular necrosis of hip, left ([place]) 10/11/2016  . Status post left hip replacement 10/11/2016  . Unilateral primary osteoarthritis, left hip 09/18/2016  . S/P transmetatarsal amputation of foot, left ([place]) 07/25/2016  . Balance problem 07/25/2016  . Sepsis ([place]) 05/07/2016  . Fournier's gangrene 08/16/2011  . Acute respiratory failure ([place]) 08/16/2011  . Septic shock(785.52) 08/16/2011  . Diabetic osteomyelitis ([place]) 08/16/2011    Past Medical History:  Past Medical History:  Diagnosis Date  .  Anxiety    self reported  . Arthritis   . Depression    self reported  . Diabetes mellitus   . Hypertension    Past Surgical History:  Past Surgical History:  Procedure Laterality Date  . AMPUTATION Left 05/10/2016   Procedure: Left Transmetatarsal Amputation;  Surgeon: [name], MD;  Location: [facility];  Service: Orthopedics;  Laterality: Left;  . AMPUTATION Right 05/16/2018   Procedure: PARTIAL RAY AMPUTATION FIFTH TOE RIGHT FOOT;  Surgeon: [name], DPM;  Location: [facility];  Service: Podiatry;  Laterality: Right;  . AMPUTATION Right 06/17/2018   Procedure: AMPUTATION 4th RAY Right Foot;  Surgeon: Trula Slade, DPM;  Location: [facility];  Service: Podiatry;  Laterality: Right;  . AMPUTATION Right 06/19/2018   Procedure: RIGHT BELOW KNEE AMPUTATION;  Surgeon: [name], MD;  Location: [facility];  Service: Orthopedics;  Laterality: Right;  . APPLICATION OF WOUND VAC Right 06/19/2018   Procedure: APPLICATION OF WOUND VAC;  Surgeon: [name], MD;  Location: [facility];  Service: Orthopedics;  Laterality: Right;  . CIRCUMCISION  09/02/2011   Procedure: CIRCUMCISION ADULT;  Surgeon: Molli Hazard, MD;  Location: [facility];  Service: Urology;  Laterality: N/A;  . I&D EXTREMITY  09/02/2011   Procedure: IRRIGATION AND DEBRIDEMENT EXTREMITY;  Surgeon: Theodoro Kos, DO;  Location: [facility];  Service: Plastics;  Laterality: N/A;  . INCISION AND DRAINAGE OF WOUND  08/12/2011   Procedure: IRRIGATION AND DEBRIDEMENT WOUND;  Surgeon: Zenovia Jarred, MD;  Location: [facility];  Service: General;;  perineum  . IRRIGATION AND  DEBRIDEMENT ABSCESS  08/12/2011   Procedure: IRRIGATION AND DEBRIDEMENT ABSCESS;  Surgeon: Molli Hazard, MD;  Location: [facility];  Service: Urology;  Laterality: N/A;  irrigation and debridment perineum    . IRRIGATION AND DEBRIDEMENT ABSCESS  08/14/2011   Procedure: MINOR INCISION AND DRAINAGE OF ABSCESS;  Surgeon: Molli Hazard, MD;  Location: [facility];  Service: Urology;  Laterality: N/A;  Perineal Wound Debridement;Placement Bilateral Testicular Thigh Pouches  . TOTAL HIP ARTHROPLASTY Left 10/11/2016   Procedure: LEFT TOTAL HIP ARTHROPLASTY ANTERIOR APPROACH;  Surgeon: Mcarthur Rossetti, MD;  Location: [facility];  Service: Orthopedics;  Laterality: Left;    Assessment & Plan Clinical Impression: Patient is a 47 y.o. right-handed male history of type 2 diabetes mellitus, chronic anemia, hypertension, tobacco abuse, left transmetatarsal amputation 05/10/2016.  Per chart review patient lives with 2 sons.  One level home with one-step to entry.  Independent with assistive device prior to admission.  Family assistance as needed.  Presented 06/16/2018 with right foot infection with podiatry follow-up and underwent right fourth ray amputation 06/17/2018 per Dr. Carman Ching.  Postoperatively with extensive infection requiring debridement of which limb could not be salvaged and underwent right transtibial amputation 06/19/2018 per Dr. Sharol Given.  Wound VAC applied.  Hospital course pain management.  [employer] consulted for pre-prosthetic training.  Acute blood loss anemia 10.4.  Therapy evaluations completed with recommendations of physical medicine rehab consult.  Patient was admitted for a comprehensive rehab program.  Patient transferred to CIR on 06/22/2018 .    Patient currently requires mod with basic self-care skills secondary to muscle weakness, decreased cardiorespiratoy endurance and decreased standing balance and decreased balance strategies.  Prior to hospitalization, patient could complete ADLs with modified independent .  Patient will benefit from skilled intervention to decrease level of assist with basic self-care skills and increase independence with basic self-care skills prior to discharge home with care partner.  Anticipate patient will require intermittent supervision and follow up home health.  OT - End of  Session Activity Tolerance: Tolerates 30+ min activity with multiple rests Endurance Deficit: Yes Endurance Deficit Description: decreased endurance with self-care tasks and transfers OT Assessment Rehab Potential (ACUTE ONLY): Good OT Patient demonstrates impairments in the following area(s): Balance;Endurance;Motor;Pain;Safety;Skin Integrity OT Basic ADL's Functional Problem(s): Grooming;Bathing;Dressing;Toileting OT Advanced ADL's Functional Problem(s): Simple Meal Preparation OT Transfers Functional Problem(s): Toilet;Tub/Shower OT Additional Impairment(s): None OT Plan OT Intensity: Minimum of 1-2 x/day, 45 to 90 minutes OT Frequency: 5 out of 7 days OT Duration/Estimated Length of Stay: 10-12 days OT Treatment/Interventions: Medical illustrator training;Community reintegration;Discharge planning;Disease mangement/prevention;DME/adaptive equipment instruction;Functional mobility training;Pain management;Patient/family education;Psychosocial support;Self Care/advanced ADL retraining;Skin care/wound managment;Splinting/orthotics;Therapeutic Activities;Therapeutic Exercise;UE/LE Strength taining/ROM;Wheelchair propulsion/positioning OT Basic Self-Care Anticipated Outcome(s): Mod I OT Toileting Anticipated Outcome(s): Mod I OT Bathroom Transfers Anticipated Outcome(s): Mod I - Supervision shower transfer OT Recommendation Recommendations for Other Services: Neuropsych consult;Therapeutic Recreation consult Therapeutic Recreation Interventions: Stress management;Outing/community reintergration Patient destination: Home Follow Up Recommendations: Home health OT Equipment Recommended: Tub/shower seat;Other (comment)(drop arm BSC)   Skilled Therapeutic Intervention OT eval completed with discussion of rehab process, OT purpose, POC, ELOS, and goals.  Pt declined bathing and dressing, reporting dressing earlier in the day.  Agreeable to functional transfer in ADL apt.  Pt completed squat pivot  transfer bed > w/c min assist.  Pt propelled w/c to ADL apt with increased time and one rest break due to decreased endurance.  Educated on squat pivot transfers to drop arm BSC, pt reports w/c will not  fit in bathroom so will either need to ambulate or use drop arm BSC.  Pt completed transfers with mod assist due to fatigue, weakness, and fearfulness of falling.  Attempted sit > stand from [facility] with pt requiring max assist and even then unable to achieve full upright standing.  Engaged in discussion regarding amputee support group and peer support available with pt tearful about current situation and not wanting to be a burden on his children.  Educated on purpose of therapy and goals to increase independence.  Pt returned to room and transferred back to bed with mod assist.  LPN arriving with pain meds due to pain in Rt residual limb.  OT Evaluation Precautions/Restrictions  Precautions Precautions: Fall Required Braces or Orthoses: Other Brace/Splint Other Brace/Splint: Limb guard/stump protector/positioning splint Restrictions Weight Bearing Restrictions: Yes RLE Weight Bearing: Non weight bearing Pain Pain Assessment Pain Scale: 0-10 Pain Score: 7  Pain Type: Surgical pain Pain Location: Leg Pain Orientation: Right Pain Descriptors / Indicators: Aching Pain Frequency: Intermittent Pain Onset: On-going Patients Stated Pain Goal: 4 Pain Intervention(s): Medication (See eMAR);Repositioned Home Living/Prior Functioning Home Living Living Arrangements: Children Available Help at Discharge: Family, Available 24 hours/day Type of Home: House Home Access: Stairs to enter Technical brewer of Steps: 1 Entrance Stairs-Rails: None Home Layout: One level Bathroom Shower/Tub: Multimedia programmer: Programmer, systems: Yes  Lives With: Son(2 sons (ages 105 and 71)) IADL History Homemaking Responsibilities: Yes Meal Prep Responsibility: Primary Laundry  Responsibility: No Cleaning Responsibility: No Bill Paying/Finance Responsibility: Primary Shopping Responsibility: Secondary Current License: Yes Prior Function Level of Independence: Requires assistive device for independence, Independent with basic ADLs(has been using a RW; also has SPC, rollator, and manual w/c)  Able to Take Stairs?: Yes Vision Baseline Vision/History: Wears glasses Wears Glasses: Reading only Patient Visual Report: Blurring of vision(reports blurred vision for past few months, especially with distance) Vision Assessment?: Yes Eye Alignment: Within Functional Limits Ocular Range of Motion: Within Functional Limits Alignment/Gaze Preference: Within Defined Limits Tracking/Visual Pursuits: Decreased smoothness of vertical tracking Saccades: Within functional limits Perception  Perception: Within Functional Limits Praxis Praxis: Intact Cognition Overall Cognitive Status: Within Functional Limits for tasks assessed Arousal/Alertness: Awake/alert Orientation Level: Person;Place;Situation Person: Oriented Place: Oriented Situation: Oriented Year: 2019 Month: November Day of Week: Correct Memory: Appears intact Immediate Memory Recall: Sock;Bed;Blue Memory Recall: Sock;Blue;Bed Memory Recall Sock: Without Cue Memory Recall Blue: Without Cue Memory Recall Bed: Without Cue Attention: Sustained;Selective;Focused Focused Attention: Appears intact Sustained Attention: Appears intact Selective Attention: Appears intact Awareness: Appears intact Problem Solving: Appears intact Safety/Judgment: Appears intact Sensation Sensation Light Touch: Appears Intact(in BUE) Additional Comments: hypersensitivity in R residual limb; numbness in L foot due to L metatarsal amputation in 04/2016 Coordination Gross Motor Movements are Fluid and Coordinated: No Fine Motor Movements are Fluid and Coordinated: Yes Extremity/Trunk Assessment RUE Assessment RUE Assessment:  Within Functional Limits Active Range of Motion (AROM) Comments: WFL General Strength Comments: strength grossly 4/5 LUE Assessment LUE Assessment: Within Functional Limits General Strength Comments: strength grossly 4+/5, loose gross grasp     Refer to Care Plan for Long Term Goals  Recommendations for other services: Neuropsych and Therapeutic Recreation  Stress management and Outing/community reintegration   Discharge Criteria: Patient will be discharged from OT if patient refuses treatment 3 consecutive times without medical reason, if treatment goals not met, if there is a change in medical status, if patient makes no progress towards goals or if patient is discharged from hospital.  The above  assessment, treatment plan, treatment alternatives and goals were discussed and mutually agreed upon: by patient  Simonne Come 06/23/2018, 3:29 PM

## 2018-06-23 NOTE — Care Management Note (Signed)
[facility] Individual Statement of Services  Patient Name:  Edward [name]  Date:  06/23/2018  Welcome to the [facility].  Our goal is to provide you with an individualized program based on your diagnosis and situation, designed to meet your specific needs.  With this comprehensive rehabilitation program, you will be expected to participate in at least 3 hours of rehabilitation therapies Monday-Friday, with modified therapy programming on the weekends.  Your rehabilitation program will include the following services:  Physical Therapy (PT), Occupational Therapy (OT), 24 hour per day rehabilitation nursing, Therapeutic Recreaction (TR), Neuropsychology, Case Management (Social Worker), Rehabilitation Medicine, Nutrition Services and Pharmacy Services  Weekly team conferences will be held on Wednesday to discuss your progress.  Your Social Worker will talk with you frequently to get your input and to update you on team discussions.  Team conferences with you and your family in attendance may also be held.  Expected length of stay: 7-9 days  Overall anticipated outcome: independent with device  Depending on your progress and recovery, your program may change. Your Social Worker will coordinate services and will keep you informed of any changes. Your Social Worker's name and contact numbers are listed  below.  The following services may also be recommended but are not provided by the [facility]:    [facility] will be made to provide these services after discharge if needed.  Arrangements include referral to agencies that provide these services.  Your insurance has been verified to be:  medicaid Your primary doctor is:  [name]  Pertinent information will be shared with your doctor and your insurance company.  Social Worker:  Ovidio Kin, [place] or ([phone]  Information discussed with and copy given to patient by: Elease Hashimoto, 06/23/2018, 10:42 AM

## 2018-06-24 ENCOUNTER — Inpatient Hospital Stay (HOSPITAL_COMMUNITY): Payer: Medicaid Other | Admitting: Physical Therapy

## 2018-06-24 ENCOUNTER — Inpatient Hospital Stay (HOSPITAL_COMMUNITY): Payer: Medicaid Other | Admitting: Occupational Therapy

## 2018-06-24 DIAGNOSIS — R7309 Other abnormal glucose: Secondary | ICD-10-CM

## 2018-06-24 DIAGNOSIS — I10 Essential (primary) hypertension: Secondary | ICD-10-CM

## 2018-06-24 DIAGNOSIS — G8918 Other acute postprocedural pain: Secondary | ICD-10-CM

## 2018-06-24 LAB — GLUCOSE, CAPILLARY
GLUCOSE-CAPILLARY: 165 mg/dL — AB (ref 70–99)
Glucose-Capillary: 147 mg/dL — ABNORMAL HIGH (ref 70–99)
Glucose-Capillary: 129 mg/dL — ABNORMAL HIGH (ref 70–99)
Glucose-Capillary: 104 mg/dL — ABNORMAL HIGH (ref 70–99)

## 2018-06-24 NOTE — Patient Care Conference (Signed)
Inpatient RehabilitationTeam Conference and Plan of Care Update Date: 06/24/2018   Time: 2:00 PM    Patient Name: Edward [name]      Medical Record Number: [id number]  Date of Birth: 05/28/1971 Sex: Male         Room/Bed: 4M08C/4M08C-01 Payor Info: Payor: MEDICAID [place] / Plan: MEDICAID OF [place] / Product Type: *No Product type* /    Admitting Diagnosis: R BKA  Admit Date/Time:  06/22/2018  5:27 PM Admission Comments: No comment available   Primary Diagnosis:  <principal problem not specified> Principal Problem: <principal problem not specified>  Patient Active Problem List   Diagnosis Date Noted  . Postoperative pain   . Labile blood glucose   . Essential hypertension   . History of transmetatarsal amputation of left foot ([place])   . Hypoalbuminemia due to protein-calorie malnutrition ([place])   . Stage 3 chronic kidney disease ([place])   . Poorly controlled type 2 diabetes mellitus with peripheral neuropathy ([place])   . Unilateral complete BKA, right, subsequent encounter ([place])   . Right below-knee amputee ([place]) 06/22/2018  . Acute blood loss anemia   . Post-operative pain   . Anemia of chronic disease   . Leukocytosis   . Unilateral complete BKA, right, initial encounter ([place])   . Tobacco abuse   . Hyperkalemia 06/20/2018  . Diabetic polyneuropathy associated with type 2 diabetes mellitus ([place])   . Severe protein-calorie malnutrition ([place])   . AKI (acute kidney injury) ([place]) 06/17/2018  . Normocytic anemia 06/17/2018  . Osteomyelitis of right foot ([place]) 05/14/2018  . DM2 (diabetes mellitus, type 2) ([place]) 05/14/2018  . Viral pharyngitis 01/14/2017  . Cigarette smoker 01/14/2017  . Elevated blood pressure reading 01/14/2017  . Avascular necrosis of hip, left ([place]) 10/11/2016  . Status post left hip replacement 10/11/2016  . Unilateral primary osteoarthritis, left hip 09/18/2016  . S/P transmetatarsal amputation of foot, left ([place]) 07/25/2016  . Balance problem 07/25/2016  . Sepsis  ([place]) 05/07/2016  . Fournier's gangrene 08/16/2011  . Acute respiratory failure ([place]) 08/16/2011  . Septic shock(785.52) 08/16/2011  . Diabetic osteomyelitis ([place]) 08/16/2011    Expected Discharge Date: Expected Discharge Date: 07/01/18  Team Members Present: Physician leading conference: Dr. Delice Lesch Social Worker Present: Ovidio Kin, LCSW Nurse Present: Dorien Chihuahua, RN PT Present: Kem Parkinson, PT OT Present: Jeannette How, OT SLP Present: Windell Moulding, SLP PPS Coordinator present : Daiva Nakayama, RN, CRRN     Current Status/Progress Goal Weekly Team Focus  Medical   Functional and cognitive deficits secondary to metastatic breast cancer to the brain s/p resection of sellar mass  Improve mobility, safety, transfers, CBGs, BP  See above   Bowel/Bladder   continent of bowel & bladder, LBM 06/23/18  remain continent  monitor for changes & assist as needed   Swallow/Nutrition/ Hydration             ADL's   min-mod assist toilet transfers and mod assist toileting, setup assist for UB dressing and donning shoe.  Pt demonstrating decreased endurance and very fearful of falling with mobility.  Mod I overall, supervision shower transfer and setup bathing  functional transfers, ADL retraining, sit > stand, care for residual limb   Mobility   modI bed mobility, minA sit <>stand, transfers, gait up to 10', stairs  modI overall  transfer/gait training, amputee education, endurance/strengthening   Communication             Safety/Cognition/ Behavioral Observations  Pain   c/o pain 5-7/10 to right stump, has tylenol & oxycodone prn  pain scale <5/10  assess & treat as needed   Skin   incision to right stump with surgical wound vac to be removed 11/8 by surgeon, no other areas noted, old transmetatarsal to left foot  no new areas of skin break down  assess q shift      *See Care Plan and progress notes for long and short-term goals.     Barriers to  Discharge  Current Status/Progress Possible Resolutions Date Resolved   Physician    Wound Care;Medical stability;Weight bearing restrictions     See above  Therapies, optimize BP/DM meds      Nursing  Wound Care  none            PT  Weight bearing restrictions  R LE NWB              OT                  SLP                SW                Discharge Planning/Teaching Needs:  Home with his two son's someone is always there if he needs them. He is doing well in his therapies.      Team Discussion:  Goals mod/i level. Currently min-mod level of assist. Poor endurance and activity tolerance. Vac tow more days. Checking CBG's and adjusting diabetic meds. Nursing providing education on diabetes. Has a fear of falling-PT is working on.  Revisions to Treatment Plan:  DC 11/13    Continued Need for Acute Rehabilitation Level of Care: The patient requires daily medical management by a physician with specialized training in physical medicine and rehabilitation for the following conditions: Daily direction of a multidisciplinary physical rehabilitation program to ensure safe treatment while eliciting the highest outcome that is of practical value to the patient.: Yes Daily medical management of patient stability for increased activity during participation in an intensive rehabilitation regime.: Yes Daily analysis of laboratory values and/or radiology reports with any subsequent need for medication adjustment of medical intervention for : Post surgical problems;Diabetes problems;Blood pressure problems   I attest that I was present, lead the team conference, and concur with the assessment and plan of the team.   Elease Hashimoto 06/24/2018, 3:15 PM

## 2018-06-24 NOTE — Plan of Care (Signed)
  Problem: Spiritual Needs Goal: Ability to function at adequate level Description Pt to decide prior to discharge if he wishes to complete advance directives.  Outcome: Progressing   Problem: Consults Goal: RH GENERAL PATIENT EDUCATION Description See Patient Education module for education specifics. Outcome: Progressing Goal: Skin Care Protocol Initiated - if Braden Score 18 or less Description If consults are not indicated, leave blank or document N/A Outcome: Progressing Goal: Diabetes Guidelines if Diabetic/Glucose > 140 Description If diabetic or lab glucose is > 140 mg/dl - Initiate Diabetes/Hyperglycemia Guidelines & Document Interventions  Outcome: Progressing   Problem: RH BOWEL ELIMINATION Goal: RH STG MANAGE BOWEL WITH ASSISTANCE Description STG Manage Bowel with mod I Assistance.  Outcome: Progressing Goal: RH STG MANAGE BOWEL W/MEDICATION W/ASSISTANCE Description STG Manage Bowel with Medication with Assistance. Outcome: Progressing   Problem: RH BLADDER ELIMINATION Goal: RH STG MANAGE BLADDER WITH ASSISTANCE Description STG Manage Bladder With mod I Assistance  Outcome: Progressing   Problem: RH SKIN INTEGRITY Goal: RH STG SKIN FREE OF INFECTION/BREAKDOWN Outcome: Progressing   Problem: RH SAFETY Goal: RH STG ADHERE TO SAFETY PRECAUTIONS W/ASSISTANCE/DEVICE Description STG Adhere to Safety Precautions With Assistance/Device. Outcome: Progressing   Problem: RH PAIN MANAGEMENT Goal: RH STG PAIN MANAGED AT OR BELOW PT'S PAIN GOAL Description Pain scale <4  Outcome: Progressing   Problem: RH KNOWLEDGE DEFICIT GENERAL Goal: RH STG INCREASE KNOWLEDGE OF SELF CARE AFTER HOSPITALIZATION Outcome: Progressing

## 2018-06-24 NOTE — Progress Notes (Signed)
Occupational Therapy Session Note  Patient Details  Name: Edward [name] MRN: [id number] Date of Birth: 08-12-1971  Today's Date: 06/24/2018 OT Individual Time: 0945-1100 and 1300-1400 OT Individual Time Calculation (min): 75 min and 60 min   Short Term Goals: Week 1:  OT Short Term Goal 1 (Week 1): Pt will complete squat pivot transfers to drop arm BSC with supervision OT Short Term Goal 2 (Week 1): Pt will complete LB dressing with supervision at sit > stand level OT Short Term Goal 3 (Week 1): Pt will complete functional ambulation to toilet with RW with supervision  Skilled Therapeutic Interventions/Progress Updates:    Session One: Pt seen for OT session focusing on functional transfers and functional standing balance/ endurance. Pt in supine upon arrival, agreeable to tx session and denying pain. He voiced having ocmpleted  He completed basic squat pivot transfer throughout session with guarding assist, min cuing for proper set-up of w/c in prep for transfer. Throughout session, completed squat pivot transfers w/c<> standard toilet, w/c<>therapy mat, and w/c <> low soft surface couch. Pt requiring mod A for transfer from low soft surface couch and considerably increased time and cuing for transfer from toilet 2/2 nervousness and low surface. Education and encouragement provided throughout. He was unable to stand from standard height toilet with RW, ultimately using lateral leans for clothing management with steadying assist for balance in sitting.  He self propelled w/c throughout unit with supervision, VCs for effective w/c propulsion technique.  He completed sit<>stand from EOM with min A, assist for controlled descent. Pt able to tolerate standing ~2 minutes while completing table top task before requiring seated rest break. Required one UE support on RW at all times while completing activity.  Pt returned to room at end of session, encouraged to stay sitting up in w/c to continue to  build OOB tolerance. Left with all needs in reach.   Session Two: Pt seen for OT session focusing on w/c management in community setting, UE strengthening, and functional transfers. Pt in supine upon arrival, denying pain and agreeable to tx session.  Throughout session, he completed squat pivot transfer w/c<>bed, w/c<> park bench, and w/c<> restaurant booth. All completed with CGA and in reasonable amount of time, an improvement from AM session. Frequent rest breaks required during self propulsion of w/c due to decreased functional activity tolerance. During rest breaks, education/discussion regarding coping stratigies with recent events, return to PLOF, continuum of care with prosthetic training and d/c planning. Pt looking forward to scheduled peer support visit.  Pt returned to unit, despite encouragement and education to remain sitting OOB, pt requesting return to supine. Left in supine with all needs in reach.    Therapy Documentation Precautions:  Precautions Precautions: Fall Required Braces or Orthoses: Other Brace/Splint Other Brace/Splint: Limb guard/stump protector/positioning splint Restrictions Weight Bearing Restrictions: Yes RLE Weight Bearing: Non weight bearing   Therapy/Group: Individual Therapy  Nels Munn L 06/24/2018, 8:52 AM

## 2018-06-24 NOTE — Plan of Care (Signed)
Pt still needs education with diabetes requirements regarding dietary intake.

## 2018-06-24 NOTE — Progress Notes (Signed)
Physical Therapy Session Note  Patient Details  Name: Edward [name] MRN: [id number] Date of Birth: [birth year]  Today's Date: 06/24/2018 PT Individual Time: 0800-0900 PT Individual Time Calculation (min): 60 min   Short Term Goals: Week 1:  PT Short Term Goal 1 (Week 1): Pt will perform functional transfers with supervision  PT Short Term Goal 2 (Week 1): Pt will ambulate 74' with RW and S PT Short Term Goal 3 (Week 1): Pt will initiate curb step training with LRAD   Skilled Therapeutic Interventions/Progress Updates:    Pt received laying in bed with complaints of 7/10 pain as described below; agreeable to treatment. Independent with bed mobility and able to don shoe with increased time. Educated pt on importance of desensitizing residual limb for future prosthetic acceptance and the need to don/doff shrinker independently. CGA squat pivot transfer throughout session. Verbal cues for w/c parts management. Self-propulsion to rehab gym x 200' with increased time for endurance. CGA standing balance on LLE with bilateral UE support with RW and faded to no UE support; 5 bouts of ~10 second intervals without UE support; tends to lose balance posteriorly due to L transmet amputation and decreased ankle strategy. CGA sit>stands with UE support from table and RW for strengthening and endurance; lowered table between sets; 2x15. Hip flexor stretches: sidelying, prone on elbows and hands for self stretch, prone with towel roll under thigh for self stretch, and prone with SPT lifiting RLE; educated pt on importance of hip flexor stretching for prevention of flexion contracture for future prosthetic. Mat table LE strengthening exercises: sidelying abduction, supine SAQ, prone hip extension. W/c transport return to room for time conservation. Pt left in bed with alarm set and all needs in reach.  Therapy Documentation Precautions:  Precautions Precautions: Fall Required Braces or Orthoses: Other  Brace/Splint Other Brace/Splint: Limb guard/stump protector/positioning splint Restrictions Weight Bearing Restrictions: Yes RLE Weight Bearing: Non weight bearing Pain: Pain Assessment Pain Scale: 0-10 Pain Score: 7  Pain Type: Surgical pain Pain Location: Leg Pain Orientation: Right Pain Descriptors / Indicators: Aching Pain Frequency: Occasional Pain Onset: With Activity Pain Intervention(s): Medication (See eMAR)    Therapy/Group: Individual Therapy  [place] Rynn Markiewicz 06/24/2018, 11:47 AM

## 2018-06-24 NOTE — Progress Notes (Signed)
[place] PHYSICAL MEDICINE & REHABILITATION PROGRESS NOTE  Subjective/Complaints: Patient seen sitting up in bed this morning.  He states he did not sleep well overnight due to issues getting comfortable.  He states he had a rough first day of therapies yesterday.  ROS: Denies CP, shortness of breath, nausea, vomiting.  Objective: Vital Signs: Blood pressure (!) 129/95, pulse 93, temperature 98 F (36.7 C), resp. rate 18, SpO2 100 %. No results found. Recent Labs    06/23/18 0505  WBC 7.5  HGB 9.3*  HCT 30.3*  PLT 508*   Recent Labs    06/23/18 0505  NA 135  K 4.9  CL 106  CO2 23  GLUCOSE 111*  BUN 22*  CREATININE 1.52*  CALCIUM 8.2*    Physical Exam: BP (!) 129/95 (BP Location: Right Arm)   Pulse 93   Temp 98 F (36.7 C)   Resp 18   SpO2 100%  Constitutional: Well-developedand well nourished. NAD. HENT: Normocephalicand atraumatic.  Eyes:EOMI. No discharge.  Cardiovascular:RRR.  No JVD. Respiratory:Effort normal and breath sounds normal.  GI: Bowel sounds are normal. He exhibitsno distension.  Musculoskeletal:Right BKA with tenderness Left transmet amputation Neurological: He isalertand oriented. Motor: B/l UE 4/5 proximal to distal, unchanged LLE: 4/5 proximal to distal, unchanged RLE: HF 4+/5 proximal to distal Skin:Left transmetatarsal amputation site well-healed.  Right BKA with wound VAC Psychiatric: He has anormal mood and affect. Hisbehavior is normal.Thought contentnormal.   Assessment/Plan: 1. Functional deficits secondary to right BKA left transmetatarsal amputation which require 3+ hours per day of interdisciplinary therapy in a comprehensive inpatient rehab setting.  Physiatrist is providing close team supervision and 24 hour management of active medical problems listed below.  Physiatrist and rehab team continue to assess barriers to discharge/monitor patient progress toward functional and medical goals  Care  Tool:  Bathing    Body parts bathed by patient: Right arm, Left arm, Chest, Abdomen, Buttocks, Right upper leg, Left upper leg, Front perineal area         Bathing assist Assist Level: Independent     Upper Body Dressing/Undressing Upper body dressing        Upper body assist      Lower Body Dressing/Undressing Lower body dressing      What is the patient wearing?: Pants, Ace wrap/stump shrinker     Lower body assist Assist for lower body dressing: Minimal Assistance - Patient > 75%     Toileting Toileting    Toileting assist Assist for toileting: Moderate Assistance - Patient 50 - 74%     Transfers Chair/bed transfer  Transfers assist     Chair/bed transfer assist level: Moderate Assistance - Patient 50 - 74%     Locomotion Ambulation   Ambulation assist      Assist level: Contact Guard/Touching assist Assistive device: Walker-rolling Max distance: 10   Walk 10 feet activity   Assist     Assist level: Contact Guard/Touching assist Assistive device: Walker-rolling   Walk 50 feet activity   Assist Walk 50 feet with 2 turns activity did not occur: Safety/medical concerns         Walk 150 feet activity   Assist Walk 150 feet activity did not occur: Safety/medical concerns         Walk 10 feet on uneven surface  activity   Assist Walk 10 feet on uneven surfaces activity did not occur: Safety/medical concerns         Wheelchair     Assist Will  patient use wheelchair at discharge?: Yes Type of Wheelchair: Manual    Wheelchair assist level: Supervision/Verbal cueing Max wheelchair distance: 150    Wheelchair 50 feet with 2 turns activity    Assist        Assist Level: Supervision/Verbal cueing   Wheelchair 150 feet activity     Assist     Assist Level: Supervision/Verbal cueing      Medical Problem List and Plan: 1.  Decreased functional mobility secondary to right below-knee amputation  06/19/2018 as well as history of left transmetatarsal amputation 05/10/2016  Continue CIR 2.  DVT Prophylaxis/Anticoagulation: SCDs left lower extremity 3. Pain Management: Robaxin and oxycodone as needed 4. Mood: Provide emotional support 5. Neuropsych: This patient is capable of making decisions on his own behalf. 6. Skin/Wound Care: Routine skin checks  DC VAC on 11/8 7. Fluids/Electrolytes/Nutrition: Routine in and outs 8.  Acute blood loss anemia.    Hemoglobin 9.3 on 11/5 9.  Diabetes mellitus with peripheral neuropathy.  Hemoglobin A1c 15.9.  NovoLog 70/30 20 units twice daily.  Patient also on Janumet 50-1000 mg twice daily prior to admission.  Resume as needed check blood sugars before meals and at bedtime.  Diabetic teaching.  Patient needs blood glucose monitor at discharge  Labile on 11/6, monitor for trend 10.  Hypertension.  Patient on lisinopril 2.5 mg daily prior to admission.  Resume as needed  Relatively controlled on 11/6 11.  Constipation.  Laxative assistance 12.  CKD  Creatinine 1.52 on 11/5  Continue to monitor 15.  Hypoalbuminemia  Supplement initiated on 11/5  LOS: 2 days A FACE TO FACE EVALUATION WAS PERFORMED   Lorie Phenix 06/24/2018, 9:43 AM

## 2018-06-24 NOTE — Progress Notes (Signed)
Social Work Patient ID: Edward [name], male   DOB: Jan 30, 1971, 47 y.o.   MRN: [id number] Met with pt to discuss team conference goals mod/i level and target discharge date 11/13. He feels he is doing better but concerned about how tired he is and his endurance. He realizes this takes time to get back. Will work toward discharge 11/13.

## 2018-06-25 ENCOUNTER — Inpatient Hospital Stay (HOSPITAL_COMMUNITY): Payer: Medicaid Other

## 2018-06-25 ENCOUNTER — Inpatient Hospital Stay (HOSPITAL_COMMUNITY): Payer: Medicaid Other | Admitting: Physical Therapy

## 2018-06-25 ENCOUNTER — Inpatient Hospital Stay (HOSPITAL_COMMUNITY): Payer: Medicaid Other | Admitting: Occupational Therapy

## 2018-06-25 LAB — GLUCOSE, CAPILLARY
GLUCOSE-CAPILLARY: 165 mg/dL — AB (ref 70–99)
GLUCOSE-CAPILLARY: 71 mg/dL (ref 70–99)
GLUCOSE-CAPILLARY: 135 mg/dL — AB (ref 70–99)
Glucose-Capillary: 66 mg/dL — ABNORMAL LOW (ref 70–99)
Glucose-Capillary: 132 mg/dL — ABNORMAL HIGH (ref 70–99)

## 2018-06-25 MED ORDER — METOPROLOL TARTRATE 25 MG PO TABS
25.0000 mg | ORAL_TABLET | Freq: Two times a day (BID) | ORAL | Status: DC
  Administered 2018-06-25 – 2018-07-01 (×13): 25 mg via ORAL
  Filled 2018-06-25 (×13): qty 1

## 2018-06-25 NOTE — Progress Notes (Signed)
Occupational Therapy Session Note  Patient Details  Name: Edward [name] MRN: [id number] Date of Birth: 1970/08/20  Today's Date: 06/25/2018 OT Individual Time: 1330-1400 OT Individual Time Calculation (min): 30 min    Short Term Goals: Week 1:  OT Short Term Goal 1 (Week 1): Pt will complete squat pivot transfers to drop arm BSC with supervision OT Short Term Goal 2 (Week 1): Pt will complete LB dressing with supervision at sit > stand level OT Short Term Goal 3 (Week 1): Pt will complete functional ambulation to toilet with RW with supervision  Skilled Therapeutic Interventions/Progress Updates:    OT intervention with focus on bed mobility, sit<>stand, standing balance, and LLE therex to increase independence with BADLs.  Pt engaged in standing exercises-squats 3 X 10 with rest breaks. Pt completed exercises at supervision level.  Pt performed sit<>stand X 5 at supervision level.  Pt remained in bed with all needs within reach and bed alarm activated.   Therapy Documentation Precautions:  Precautions Precautions: Fall Required Braces or Orthoses: Other Brace/Splint Other Brace/Splint: Limb guard/stump protector/positioning splint Restrictions Weight Bearing Restrictions: Yes RLE Weight Bearing: Non weight bearing(Right BKA) Pain: Pain Assessment  Pain Scale: 0-10 Pain Location: Leg(residual limb) Pain Orientation: Right Pain Descriptors / Indicators: Aching;Tightness;Discomfort;Other (Comment);Constant;Throbbing(Phantom Limb Sensation and Pain) Pain Onset: On-going Pain Intervention(s): Emotional support;Repositioned(desensitization)   Therapy/Group: Individual Therapy  Leroy Libman 06/25/2018, 2:45 PM

## 2018-06-25 NOTE — Progress Notes (Signed)
[facility] PHYSICAL MEDICINE & REHABILITATION PROGRESS NOTE  Subjective/Complaints: Patient seen sitting up in bed this morning.  He states he slept well overnight.  He has questions about when his back is going to be DC'd.  He notes improvement in strength  ROS: Denies CP, shortness of breath, nausea, vomiting.  Objective: Vital Signs: Blood pressure (!) 154/97, pulse 99, temperature 98.5 F (36.9 C), temperature source Oral, resp. rate 19, SpO2 100 %. No results found. Recent Labs    06/23/18 0505  WBC 7.5  HGB 9.3*  HCT 30.3*  PLT 508*   Recent Labs    06/23/18 0505  NA 135  K 4.9  CL 106  CO2 23  GLUCOSE 111*  BUN 22*  CREATININE 1.52*  CALCIUM 8.2*    Physical Exam: BP (!) 154/97   Pulse 99   Temp 98.5 F (36.9 C) (Oral)   Resp 19   SpO2 100%  Constitutional: Well-developedand well nourished. NAD. HENT: Normocephalicand atraumatic.  Eyes:EOMI. No discharge.  Cardiovascular:RRR.  No JVD. Respiratory:Effort normal and breath sounds normal.  GI: Bowel sounds are normal. He exhibitsno distension.  Musculoskeletal:Right BKA with tenderness, stable Left transmet amputation, stable Neurological: He isalertand oriented. Motor: B/l UE 4+/5 proximal to distal LLE: 4+/5 proximal to distal RLE: HF 4+/5 proximal to distal Skin:Left transmetatarsal amputation site well-healed.  Right BKA with wound VAC Psychiatric: He has anormal mood and affect. Hisbehavior is normal.Thought contentnormal.   Assessment/Plan: 1. Functional deficits secondary to right BKA left transmetatarsal amputation which require 3+ hours per day of interdisciplinary therapy in a comprehensive inpatient rehab setting.  Physiatrist is providing close team supervision and 24 hour management of active medical problems listed below.  Physiatrist and rehab team continue to assess barriers to discharge/monitor patient progress toward functional and medical goals  Care  Tool:  Bathing    Body parts bathed by patient: Right arm, Left arm, Chest, Abdomen, Right upper leg, Left upper leg, Front perineal area, Right lower leg, Face   Body parts bathed by helper: Buttocks Body parts n/a: Left lower leg   Bathing assist Assist Level: Minimal Assistance - Patient > 75%     Upper Body Dressing/Undressing Upper body dressing   What is the patient wearing?: Pull over shirt    Upper body assist Assist Level: Set up assist    Lower Body Dressing/Undressing Lower body dressing      What is the patient wearing?: Pants, Ace wrap/stump shrinker     Lower body assist Assist for lower body dressing: Minimal Assistance - Patient > 75%     Toileting Toileting    Toileting assist Assist for toileting: Minimal Assistance - Patient > 75%     Transfers Chair/bed transfer  Transfers assist     Chair/bed transfer assist level: Contact Guard/Touching assist     Locomotion Ambulation   Ambulation assist      Assist level: Contact Guard/Touching assist Assistive device: Walker-rolling Max distance: 10   Walk 10 feet activity   Assist     Assist level: Contact Guard/Touching assist Assistive device: Walker-rolling   Walk 50 feet activity   Assist Walk 50 feet with 2 turns activity did not occur: Safety/medical concerns         Walk 150 feet activity   Assist Walk 150 feet activity did not occur: Safety/medical concerns         Walk 10 feet on uneven surface  activity   Assist Walk 10 feet on uneven surfaces activity  did not occur: Safety/medical concerns         Wheelchair     Assist Will patient use wheelchair at discharge?: Yes Type of Wheelchair: Manual    Wheelchair assist level: Set up assist Max wheelchair distance: 200    Wheelchair 50 feet with 2 turns activity    Assist        Assist Level: Set up assist   Wheelchair 150 feet activity     Assist     Assist Level: Set up assist       Medical Problem List and Plan: 1.  Decreased functional mobility secondary to right below-knee amputation 06/19/2018 as well as history of left transmetatarsal amputation 05/10/2016  Continue CIR 2.  DVT Prophylaxis/Anticoagulation: SCDs left lower extremity 3. Pain Management: Robaxin and oxycodone as needed 4. Mood: Provide emotional support 5. Neuropsych: This patient is capable of making decisions on his own behalf. 6. Skin/Wound Care: Routine skin checks  DC VAC tomorrow 7. Fluids/Electrolytes/Nutrition: Routine in and outs 8.  Acute blood loss anemia.    Hemoglobin 9.3 on 11/5 9.  Diabetes mellitus with peripheral neuropathy.  Hemoglobin A1c 15.9.  NovoLog 70/30 20 units twice daily.  Patient also on Janumet 50-1000 mg twice daily prior to admission.  Resume as needed check blood sugars before meals and at bedtime.  Diabetic teaching.  Patient needs blood glucose monitor at discharge  Labile on 11/7 10.  Hypertension.  Patient on lisinopril 2.5 mg daily prior to admission.   Trending up, metoprolol 25 started on 11/7 11.  Constipation.  Laxative assistance 12.  CKD  Creatinine 1.52 on 11/5  Continue to monitor 15.  Hypoalbuminemia  Supplement initiated on 11/5  LOS: 3 days A FACE TO FACE EVALUATION WAS PERFORMED  Arianah Torgeson Lorie Phenix 06/25/2018, 8:41 AM

## 2018-06-25 NOTE — Progress Notes (Signed)
Ekg completed, pt resting in bed, tightness resolving, SR up x 3, bed alarm on and functioning, call bell in reach. Will continue to monitor.

## 2018-06-25 NOTE — Significant Event (Signed)
Hypoglycemic Event  CBG: 66  Treatment: 15 GM carbohydrate snack  Symptoms: Sweaty and Nervous/irritable  Follow-up CBG: Time:1122 CBG Result:71  Possible Reasons for Event: Unknown  Comments/MD notified:Dan Angiulli, PA-c notified, new orders    Edward [name]

## 2018-06-25 NOTE — Telephone Encounter (Signed)
Will follow up after discharge.

## 2018-06-25 NOTE — Progress Notes (Signed)
Called to room, pt c/o of tightness in chest, VS charted, noted pt to be diaphoretic, CBG 66 orals provided, PA notified with new orders. Pt reported to feel a little better, pt back to bed, EKG pending. Will continue to monitor.

## 2018-06-25 NOTE — Progress Notes (Signed)
Physical Therapy Session Note  Patient Details  Name: Edward [name] MRN: [id number] Date of Birth: [birth year]  Today's Date: 06/25/2018 PT Individual Time: 1140-1155 and 1400-1430 PT Individual Time Calculation (min): 45 min   Short Term Goals: Week 1:  PT Short Term Goal 1 (Week 1): Pt will perform functional transfers with supervision  PT Short Term Goal 2 (Week 1): Pt will ambulate 55' with RW and S PT Short Term Goal 3 (Week 1): Pt will initiate curb step training with LRAD   Skilled Therapeutic Interventions/Progress Updates:    Session 1: Pt received seated in w/c, 5/10 pain as described below, and agreeable to PT session. Reiterated the importance of donning/doffing shrinker: independence, desensitization, prosthetic acceptance in future. Doffed shrinker and immediately after pt reports "chest tightness" and difficulties taking a deep breath. RN called. Upon questioning, pt reports tingles in both arms down to pinkies and denies lightheadedness or dizziness. Vitals as reported below as well as decreased blood sugar. Left with RN to assess.  SPT and PT returned to pt after an hour (laying in bed). Pt agreeable for treatment that last 15 mins of the scheduled session. Reiterated the importance of positioning and hip/knee flexor stretches for contracture prevention. LE strengthening exercises: R quad sets, seated marching, adduction squeezes and L SLR. Pt remained seated in bed with alarm intact, needs in reach, and lunch tray in front of him.   Session 2: Pt received laying in bed; agreeable to treatment. CGA sit>stand and CGA ambulation to restroom with RW x~41ft for urination and bowel movement. Pt able to complete hygiene in seated position and CGA standing for clothing management with increased time. Pt able to don shrinker independently. W/c propulsion: around unit with modI for endurance, weaving through cones with modI for w/c navigation, and up/down ramp with minA to practice  inclines. Educated pt on w/c propulsion pattern vs energy expenditure; encouraged pt to use larger semi-circular pattern instead of short arcs. CGA squat pivot from w/c>bed. Remained in bed with alarm intact and all needs in reach.  Therapy Documentation Precautions:  Precautions Precautions: Fall Required Braces or Orthoses: Other Brace/Splint Other Brace/Splint: Limb guard/stump protector/positioning splint Restrictions Weight Bearing Restrictions: Yes RLE Weight Bearing: Non weight bearing(Right BKA) General: PT Amount of Missed Time (min): 60 Minutes PT Missed Treatment Reason: Nursing care Vital Signs: Therapy Vitals Pulse Rate: (!) 105 BP: (!) 167/104 Patient Position (if appropriate): sitting Oxygen Therapy SpO2: 100 % O2 Device: Room Air Pain: Pain Assessment Pain Scale: 0-10 Pain Location: Leg(residual limb) Pain Orientation: Right Pain Descriptors / Indicators: Aching;Tightness;Discomfort;Other (Comment);Constant;Throbbing(Phantom Limb Sensation and Pain) Pain Onset: On-going Pain Intervention(s): Emotional support;Repositioned(desensitization)    Therapy/Group: Individual Therapy  [name] 06/25/2018, 1:23 PM

## 2018-06-25 NOTE — Progress Notes (Signed)
Occupational Therapy Session Note  Patient Details  Name: [name] MRN: [id number] Date of Birth: 09-Mar-1971  Today's Date: 06/25/2018 OT Individual Time:  9758-8325 (60 mins)       Short Term Goals: Week 1:  OT Short Term Goal 1 (Week 1): Pt will complete squat pivot transfers to drop arm BSC with supervision OT Short Term Goal 2 (Week 1): Pt will complete LB dressing with supervision at sit > stand level OT Short Term Goal 3 (Week 1): Pt will complete functional ambulation to toilet with RW with supervision  Skilled Therapeutic Interventions/Progress Updates:    Pt sitting EOB upon entry with reports of pain in R BKA however reports of just receiving pain meds and agreeable to session. Pt performed squat pivot transfers throughout session with min-mod A and min VC's for hand placement and sit to stand at RW with min A for initiation of stand. Pt transferred to w/c then to shower with pt then performing all bathing tasks while seated using lateral leans for buttocks. Pt then transferred back to w/c for dressing tasks. In standing at RW, pt able to complete pulling up pants alternating UE support on RW.   Introductory education provided regarding touching residual limb and desensitization with verbal comprehension from pt. With therapist beginning donning of shrinker on residual limb pt then able to complete with verbal encouragement from therapist throughout. Pt also able to don limb guard this session. Pt then performed grooming tasks standing at sink with alternating UE support on sink/RW throughout ~2 mins. Pt returned to w/c and left seated in w/c with all needs in reach.   Therapy Documentation Precautions:  Precautions Precautions: Fall Required Braces or Orthoses: Other Brace/Splint Other Brace/Splint: Limb guard/stump protector/positioning splint Restrictions Weight Bearing Restrictions: Yes RLE Weight Bearing: Non weight bearing(Right BKA) ADL: ADL Grooming: Contact  guard Where Assessed-Grooming: Standing at sink Upper Body Bathing: Supervision/safety Where Assessed-Upper Body Bathing: Shower Lower Body Bathing: Contact guard Where Assessed-Lower Body Bathing: Shower Upper Body Dressing: Setup Where Assessed-Upper Body Dressing: Wheelchair Lower Body Dressing: Minimal assistance Where Assessed-Lower Body Dressing: Teaching laboratory technician: Minimal cueing, Moderate assistance Social research officer, government Method: Education officer, environmental: Transfer tub bench   Therapy/Group: Individual Therapy  Creola Krotz 06/25/2018, 9:55 AM

## 2018-06-26 ENCOUNTER — Inpatient Hospital Stay (INDEPENDENT_AMBULATORY_CARE_PROVIDER_SITE_OTHER): Payer: Medicaid Other | Admitting: Physician Assistant

## 2018-06-26 ENCOUNTER — Inpatient Hospital Stay (HOSPITAL_COMMUNITY): Payer: Medicaid Other

## 2018-06-26 ENCOUNTER — Inpatient Hospital Stay (HOSPITAL_COMMUNITY): Payer: Medicaid Other | Admitting: Occupational Therapy

## 2018-06-26 ENCOUNTER — Inpatient Hospital Stay (HOSPITAL_COMMUNITY): Payer: Medicaid Other | Admitting: Physical Therapy

## 2018-06-26 LAB — GLUCOSE, CAPILLARY
GLUCOSE-CAPILLARY: 185 mg/dL — AB (ref 70–99)
Glucose-Capillary: 110 mg/dL — ABNORMAL HIGH (ref 70–99)
Glucose-Capillary: 75 mg/dL (ref 70–99)
Glucose-Capillary: 114 mg/dL — ABNORMAL HIGH (ref 70–99)

## 2018-06-26 MED ORDER — LIVING WELL WITH DIABETES BOOK
Freq: Once | Status: AC
  Administered 2018-06-26: 17:00:00
  Filled 2018-06-26: qty 1

## 2018-06-26 NOTE — Progress Notes (Signed)
[address] PHYSICAL MEDICINE & REHABILITATION PROGRESS NOTE  Subjective/Complaints:  Poor sleep last noc but not related to pain  ROS: Denies CP, shortness of breath, nausea, vomiting.  Objective: Vital Signs: Blood pressure 139/88, pulse 94, temperature 99 F (37.2 C), temperature source Oral, resp. rate 18, SpO2 98 %. No results found. No results for input(s): WBC, HGB, HCT, PLT in the last 72 hours. No results for input(s): NA, K, CL, CO2, GLUCOSE, BUN, CREATININE, CALCIUM in the last 72 hours.  Physical Exam: BP 139/88 (BP Location: Right Arm)   Pulse 94   Temp 99 F (37.2 C) (Oral)   Resp 18   SpO2 98%  Constitutional: Well-developedand well nourished. NAD. HENT: Normocephalicand atraumatic.  Eyes:EOMI. No discharge.  Cardiovascular:RRR.  No JVD. Respiratory:Effort normal and breath sounds normal.  GI: Bowel sounds are normal. He exhibitsno distension.  Musculoskeletal:Right BKA with tenderness, stable Left transmet amputation, stable Neurological: He isalertand oriented. Motor: B/l UE 4+/5 proximal to distal LLE: 4+/5 proximal to distal RLE: HF 4+/5 proximal to distal Skin:Left transmetatarsal amputation site well-healed.  Right BKA with wound VAC Psychiatric: He has anormal mood and affect. Hisbehavior is normal.Thought contentnormal.   Assessment/Plan: 1. Functional deficits secondary to right BKA left transmetatarsal amputation which require 3+ hours per day of interdisciplinary therapy in a comprehensive inpatient rehab setting.  Physiatrist is providing close team supervision and 24 hour management of active medical problems listed below.  Physiatrist and rehab team continue to assess barriers to discharge/monitor patient progress toward functional and medical goals  Care Tool:  Bathing    Body parts bathed by patient: Right arm, Left arm, Chest, Abdomen, Right upper leg, Left upper leg, Front perineal area, Right lower leg, Face, Buttocks    Body parts bathed by helper: Buttocks Body parts n/a: Left lower leg   Bathing assist Assist Level: Contact Guard/Touching assist     Upper Body Dressing/Undressing Upper body dressing   What is the patient wearing?: Pull over shirt    Upper body assist Assist Level: Set up assist    Lower Body Dressing/Undressing Lower body dressing      What is the patient wearing?: Pants, Ace wrap/stump shrinker     Lower body assist Assist for lower body dressing: Minimal Assistance - Patient > 75%     Toileting Toileting    Toileting assist Assist for toileting: Minimal Assistance - Patient > 75%     Transfers Chair/bed transfer  Transfers assist     Chair/bed transfer assist level: Contact Guard/Touching assist     Locomotion Ambulation   Ambulation assist      Assist level: Contact Guard/Touching assist Assistive device: Walker-rolling Max distance: 15   Walk 10 feet activity   Assist     Assist level: Contact Guard/Touching assist Assistive device: Walker-rolling   Walk 50 feet activity   Assist Walk 50 feet with 2 turns activity did not occur: Safety/medical concerns         Walk 150 feet activity   Assist Walk 150 feet activity did not occur: Safety/medical concerns         Walk 10 feet on uneven surface  activity   Assist Walk 10 feet on uneven surfaces activity did not occur: Safety/medical concerns         Wheelchair     Assist Will patient use wheelchair at discharge?: Yes Type of Wheelchair: Manual    Wheelchair assist level: Set up assist Max wheelchair distance: 300    Wheelchair 50  feet with 2 turns activity    Assist        Assist Level: Set up assist   Wheelchair 150 feet activity     Assist     Assist Level: Set up assist      Medical Problem List and Plan: 1.  Decreased functional mobility secondary to right below-knee amputation 06/19/2018 as well as history of left transmetatarsal  amputation 05/10/2016  Continue CIR PT, OT, advised pt to have family bring in spacer for left shoe 2.  DVT Prophylaxis/Anticoagulation: SCDs left lower extremity 3. Pain Management: Robaxin and oxycodone as needed 4. Mood: Provide emotional support 5. Neuropsych: This patient is capable of making decisions on his own behalf. 6. Skin/Wound Care: Routine skin checks  DC VAC today 7. Fluids/Electrolytes/Nutrition: Routine in and outs 8.  Acute blood loss anemia.    Hemoglobin 9.3 on 11/5 9.  Diabetes mellitus with peripheral neuropathy.  Hemoglobin A1c 15.9.  NovoLog 70/30 20 units twice daily.  Patient also on Janumet 50-1000 mg twice daily prior to admission.  Resume as needed check blood sugars before meals and at bedtime.  Diabetic teaching.  Patient needs blood glucose monitor at discharge CBG (last 3)  Recent Labs    06/25/18 1723 06/25/18 2128 06/26/18 0656  GLUCAP 135* 132* 110*   10.  Hypertension.  Patient on lisinopril 2.5 mg daily prior to admission.   Trending up, metoprolol 25 started on 11/7 11.  Constipation.  Laxative assistance 12.  CKD  Creatinine 1.52 on 11/5  Continue to monitor 15.  Hypoalbuminemia  Supplement initiated on 11/5  LOS: 4 days A FACE TO FACE EVALUATION WAS PERFORMED  Charlett Blake 06/26/2018, 8:03 AM

## 2018-06-26 NOTE — Progress Notes (Signed)
Occupational Therapy Session Note  Patient Details  Name: Edward [name] MRN: [id number] Date of Birth: 09/30/1970  Today's Date: 06/26/2018 OT Individual Time: 1100-1155 OT Individual Time Calculation (min): 55 min    Short Term Goals: Week 1:  OT Short Term Goal 1 (Week 1): Pt will complete squat pivot transfers to drop arm BSC with supervision OT Short Term Goal 2 (Week 1): Pt will complete LB dressing with supervision at sit > stand level OT Short Term Goal 3 (Week 1): Pt will complete functional ambulation to toilet with RW with supervision  Skilled Therapeutic Interventions/Progress Updates:    OT intervention with focus on bed mobility, doffing/donning stump shrinker, doffing/donning limb guard, w/c mobbility, functional amb with RW, BUE therex, activity tolerance, and safety awareness to increase independence with BADLs.  Pt completed all tasks at supervision with min verbal cues for donning limb guard.  Pt propelled w/c from room to day room and back without rest breaks.  Pt enaged in BUE therex on [employer] (8 mins random level 5). Pt transferred back to EOB to eat lunch.  Pt remained EOB with bed alarm activated and all needs within reach.   Therapy Documentation Precautions:  Precautions Precautions: Fall Required Braces or Orthoses: Other Brace/Splint Other Brace/Splint: Limb guard/stump protector/positioning splint Restrictions Weight Bearing Restrictions: Yes RLE Weight Bearing: Non weight bearing Pain: Pain Assessment Pain Scale: 0-10 Pain Score: 0-No pain   Therapy/Group: Individual Therapy  Leroy Libman 06/26/2018, 12:05 PM

## 2018-06-26 NOTE — Progress Notes (Signed)
Physical Therapy Session Note  Patient Details  Name: Edward [name] MRN: [id number] Date of Birth: February 15, 1971  Today's Date: 06/26/2018 PT Individual Time: 1415-1530 PT Individual Time Calculation (min): 75 min   Short Term Goals: Week 1:  PT Short Term Goal 1 (Week 1): Pt will perform functional transfers with supervision  PT Short Term Goal 2 (Week 1): Pt will ambulate 23' with RW and S PT Short Term Goal 3 (Week 1): Pt will initiate curb step training with LRAD   Skilled Therapeutic Interventions/Progress Updates:    Pt received laying in bed with nurse present (adminstering meds). Pain as described below, but agreeable to treatment. Independent with bed mobility and donning L shoe. CGA/S sit>stands and squat pivots throughout session (bed><w/c, w/c><couch, w/c><recliner, w/c><mat table) to practice functional transfers; verbal cues for set up, foot placement, and technique. ModI w/c propulsion throughout session; verbal cues to manage w/c parts. Mirror therapy to reduce phantom limb pain and phantom limb sensation by "rewiring" neural patterns: ankle DF/PF, ankle circles, ankle inversion/eversion, knee flx/ext, hip ABD/ADD, hip IR/ER; 2x15 each; no immediate effect on pain, but pt reports feeling his "R ankle working" and his "muscles getting sore." Mini crunches for core strengthening. CGA gait with RW x 30' for functional mobility and endurance. Pt remained seated EOB with needs in reach.   Therapy Documentation Precautions:  Precautions Precautions: Fall Required Braces or Orthoses: Other Brace/Splint Other Brace/Splint: Limb guard/stump protector/positioning splint Restrictions Weight Bearing Restrictions: Yes RLE Weight Bearing: Non weight bearing Pain: Pain Assessment Pain Score: 8  Pain Type: Phantom pain;Surgical pain Pain Location: Leg Pain Orientation: Right Pain Descriptors / Indicators: Burning;Aching;Constant;Discomfort("Itching") Pain Onset: On-going Pain  Intervention(s): Repositioned;Environmental changes;Therapeutic touch;Ambulation/increased activity(compression)    Therapy/Group: Individual Therapy  Martin[place] Kayman Snuffer 06/26/2018, 3:55 PM

## 2018-06-26 NOTE — Progress Notes (Signed)
Nutrition Brief Note  Dietitian consulted for nutrition education (diabetes management with meal planning ideas). Pt was unavailable at time of visit - out of room for testing.  Will provide diet education at a later date.  Althea Grimmer, MS, RDN, LDN On-call pager: [phone]

## 2018-06-26 NOTE — Progress Notes (Signed)
Occupational Therapy Session Note  Patient Details  Name: Edward [name] MRN: [id number] Date of Birth: [birth year]  Today's Date: 06/26/2018 OT Individual Time: 1771-1657 OT Individual Time Calculation (min): 60 min    Short Term Goals: Week 1:  OT Short Term Goal 1 (Week 1): Pt will complete squat pivot transfers to drop arm BSC with supervision OT Short Term Goal 2 (Week 1): Pt will complete LB dressing with supervision at sit > stand level OT Short Term Goal 3 (Week 1): Pt will complete functional ambulation to toilet with RW with supervision  Skilled Therapeutic Interventions/Progress Updates:    Pt seen for OT ADL bathing/dressing session. Pt sitting up in bed upon arrival, denied pain and agreeable to tx session. Pt reports rough night due to "personal issues", however, did not elaborate and wanted to continue on with therapy. He completed functional ambulation into bathroom using RW with CGA, min cuing for RW management in functional context.   He transferred into shower and bathed seated on tub bench, lateral leans to complete buttock hygiene- distant supervision following set-up. He completed squat pivot transfer back into w/c and dressed seated in w/c, standing with steadying assist to pull pants up. One seated LOB episode when reaching forward to don socks, min A to regain balance.  He completed oral care standing at sink with steadying assisting in order to increase functional standing tolerance. Pt insistent on return to bed at end of session despite encouragement from therapist for [place]. Pt returned to bed and left seated EOB set-up with meal tray, all needs in reach and bed alarm on.   Pt left seated in w/c at end of session, all needs in reach.   Therapy Documentation Precautions:  Precautions Precautions: Fall Required Braces or Orthoses: Other Brace/Splint Other Brace/Splint: Limb guard/stump protector/positioning splint Restrictions Weight Bearing Restrictions:  Yes RLE Weight Bearing: Non weight bearing ADL: ADL Grooming: Contact guard Where Assessed-Grooming: Standing at sink Upper Body Bathing: Supervision/safety Where Assessed-Upper Body Bathing: Shower Lower Body Bathing: Supervision Where Assessed-Lower Body Bathing: Shower Upper Body Dressing: Setup Where Assessed-Upper Body Dressing: Wheelchair Lower Body Dressing: Minimal assistance Where Assessed-Lower Body Dressing: Teaching laboratory technician: Minimal cueing, Min A Social research officer, government Method: Education officer, environmental: Transfer tub bench   Therapy/Group: Individual Therapy  Keayra Graham L 06/26/2018, 6:55 AM

## 2018-06-26 NOTE — Plan of Care (Signed)
  Problem: RH Floor Transfers Goal: LTG Patient will perform floor transfers w/assist (PT) Description LTG: Patient will perform floor transfers with assistance (PT). Outcome: Not Applicable Flowsheets (Taken 06/26/2018 1623) LTG: PT WILL PERFORM FLOOR TRANFERS  WITH  ASSIST:: -- (d/c goal due to weightbearing precautions) Note:  d/c goal due to weightbearing precautions   Problem: RH Ambulation Goal: LTG Patient will ambulate in community environment (PT) Description LTG: Patient will ambulate in community environment, # of feet with assistance (PT). Outcome: Not Applicable Flowsheets (Taken 06/26/2018 1623) LTG: Pt will ambulate in community environ  assist needed:: -- (d/c goal due to slow progress) Note:  D/c due to slow progress   Problem: RH Ambulation Goal: LTG Patient will ambulate in controlled environment (PT) Description LTG: Patient will ambulate in a controlled environment, 75 of feet with assistance (PT).  Flowsheets (Taken 06/26/2018 1623) LTG: Pt will ambulate in controlled environ  assist needed:: Supervision/Verbal cueing (downgraded due to slow progress) LTG: Ambulation distance in controlled environment: 75 Note:  downgraded due to slow progress Goal: LTG Patient will ambulate in home environment (PT) Description LTG: Patient will ambulate in home environment, # of feet with assistance (PT). Flowsheets (Taken 06/26/2018 1623) LTG: Pt will ambulate in home environ  assist needed:: Supervision/Verbal cueing (downgraded due to slow progress) LTG: Ambulation distance in home environment: 33' Note:  downgraded due to slow progress

## 2018-06-26 NOTE — Progress Notes (Signed)
Inpatient Diabetes Program Recommendations  AACE/ADA: New Consensus Statement on Inpatient Glycemic Control (2015)  Target Ranges:  Prepandial:   less than 140 mg/dL      Peak postprandial:   less than 180 mg/dL (1-2 hours)      Critically ill patients:  140 - 180 mg/dL   Lab Results  Component Value Date   GLUCAP 110 (H) 06/26/2018   HGBA1C 15.9 (H) 05/14/2018    Review of Glycemic Control  Diabetes history: Type 2 Outpatient Diabetes medications: 70/30 insulin 15 units BID, Janumet Current orders for Inpatient glycemic control: Novolog 70/30 insulin 20 units BID, Novolog SENSITIVE TID correction scale  Inpatient Diabetes Program Recommendations:    Noted that patient had a low blood sugar of 66 mg/dl yesterday at 1100. Recommend decreasing 70/30 insulin to 15 units BID (which is home dose) and continue Novolog correction scale as ordered. Will talk with patient today. HgbA1C is 15.9%. Ordered dietician consult.  Edward Ricks RN BSN CDE Diabetes Coordinator Pager: [phone]  8am-5pm

## 2018-06-26 NOTE — Plan of Care (Signed)
  Problem: Spiritual Needs Goal: Ability to function at adequate level Description Pt to decide prior to discharge if he wishes to complete advance directives.  Outcome: Progressing   Problem: Consults Goal: RH GENERAL PATIENT EDUCATION Description See Patient Education module for education specifics. Outcome: Progressing Goal: Skin Care Protocol Initiated - if Braden Score 18 or less Description If consults are not indicated, leave blank or document N/A Outcome: Progressing   Problem: RH BOWEL ELIMINATION Goal: RH STG MANAGE BOWEL WITH ASSISTANCE Description STG Manage Bowel with mod I Assistance.  Outcome: Progressing Goal: RH STG MANAGE BOWEL W/MEDICATION W/ASSISTANCE Description STG Manage Bowel with Medication with Assistance. Outcome: Progressing   Problem: RH BLADDER ELIMINATION Goal: RH STG MANAGE BLADDER WITH ASSISTANCE Description STG Manage Bladder With mod I Assistance  Outcome: Progressing   Problem: RH SKIN INTEGRITY Goal: RH STG SKIN FREE OF INFECTION/BREAKDOWN Outcome: Progressing   Problem: RH SAFETY Goal: RH STG ADHERE TO SAFETY PRECAUTIONS W/ASSISTANCE/DEVICE Description STG Adhere to Safety Precautions With Assistance/Device. Outcome: Progressing   Problem: RH PAIN MANAGEMENT Goal: RH STG PAIN MANAGED AT OR BELOW PT'S PAIN GOAL Description Pain scale <4  Outcome: Progressing   Problem: RH KNOWLEDGE DEFICIT GENERAL Goal: RH STG INCREASE KNOWLEDGE OF SELF CARE AFTER HOSPITALIZATION Outcome: Progressing

## 2018-06-26 NOTE — Progress Notes (Signed)
Spoke with patient about his diabetes. States that he was diagnosed in his early 41's. His mother and father both had diabetes. Takes 70/30 insulin 15 units BID and Janumet at home. Reviewed HgbA1C of 15.9% and talked with him about that A1C being equal to >400 mg/dl on an average. He was surprised that it was that high. States that he takes his insulin everyday. Will follow up at [facility] on first appointment at discharge. He will call them for appointment.  He seems positive with physcial therapy and wants to work hard. Ordered Living Well with Diabetes booklet from pharmacy for patient.   Harvel Ricks RN BSN CDE Diabetes Coordinator Pager: [phone]  8am-5pm

## 2018-06-27 LAB — GLUCOSE, CAPILLARY
GLUCOSE-CAPILLARY: 183 mg/dL — AB (ref 70–99)
GLUCOSE-CAPILLARY: 78 mg/dL (ref 70–99)
GLUCOSE-CAPILLARY: 134 mg/dL — AB (ref 70–99)
Glucose-Capillary: 303 mg/dL — ABNORMAL HIGH (ref 70–99)

## 2018-06-27 NOTE — Plan of Care (Signed)
  Problem: Spiritual Needs Goal: Ability to function at adequate level Description Pt to decide prior to discharge if he wishes to complete advance directives.  Outcome: Progressing   Problem: Consults Goal: RH GENERAL PATIENT EDUCATION Description See Patient Education module for education specifics. Outcome: Progressing Goal: Skin Care Protocol Initiated - if Braden Score 18 or less Description If consults are not indicated, leave blank or document N/A Outcome: Progressing Goal: Diabetes Guidelines if Diabetic/Glucose > 140 Description If diabetic or lab glucose is > 140 mg/dl - Initiate Diabetes/Hyperglycemia Guidelines & Document Interventions  Outcome: Progressing   Problem: RH BOWEL ELIMINATION Goal: RH STG MANAGE BOWEL WITH ASSISTANCE Description STG Manage Bowel with mod I Assistance.  Outcome: Progressing Goal: RH STG MANAGE BOWEL W/MEDICATION W/ASSISTANCE Description STG Manage Bowel with Medication with Assistance. Mod I  Outcome: Progressing   Problem: RH BLADDER ELIMINATION Goal: RH STG MANAGE BLADDER WITH ASSISTANCE Description STG Manage Bladder With mod I Assistance  Outcome: Progressing   Problem: RH SKIN INTEGRITY Goal: RH STG SKIN FREE OF INFECTION/BREAKDOWN Description Free of infection and breakdown with min assist  Outcome: Progressing   Problem: RH SAFETY Goal: RH STG ADHERE TO SAFETY PRECAUTIONS W/ASSISTANCE/DEVICE Description STG Adhere to Safety Precautions With Assistance/Device. Mod I  Outcome: Progressing   Problem: RH PAIN MANAGEMENT Goal: RH STG PAIN MANAGED AT OR BELOW PT'S PAIN GOAL Description Pain scale <4  Outcome: Progressing   Problem: RH KNOWLEDGE DEFICIT GENERAL Goal: RH STG INCREASE KNOWLEDGE OF SELF CARE AFTER HOSPITALIZATION Description Patient will be able to demonstrate care of residual limb with hand outs/cues  Outcome: Progressing

## 2018-06-27 NOTE — Progress Notes (Signed)
[facility] PHYSICAL MEDICINE & REHABILITATION PROGRESS NOTE  Subjective/Complaints:  No new issues, slept well, little bit more pain since wound vac off   ROS: Denies CP, shortness of breath, nausea, vomiting.  Objective: Vital Signs: Blood pressure (!) 146/92, pulse 93, temperature 97.8 F (36.6 C), resp. rate 18, SpO2 100 %. No results found. No results for input(s): WBC, HGB, HCT, PLT in the last 72 hours. No results for input(s): NA, K, CL, CO2, GLUCOSE, BUN, CREATININE, CALCIUM in the last 72 hours.  Physical Exam: BP (!) 146/92 (BP Location: Left Arm)   Pulse 93   Temp 97.8 F (36.6 C)   Resp 18   SpO2 100%  Constitutional: Well-developedand well nourished. NAD. HENT: Normocephalicand atraumatic.  Eyes:EOMI. No discharge.  Cardiovascular:RRR.  No JVD. Respiratory:Effort normal and breath sounds normal.  GI: Bowel sounds are normal. He exhibitsno distension.  Musculoskeletal:Right BKA with tenderness, stable Left transmet amputation, stable Neurological: He isalertand oriented. Motor: B/l UE 4+/5 proximal to distal LLE: 4+/5 proximal to distal RLE: HF 4+/5 proximal to distal Skin:Left transmetatarsal amputation site well-healed.  Right BKA, staples intact no drainage, mild edema Psychiatric: He has anormal mood and affect. Hisbehavior is normal.Thought contentnormal.   Assessment/Plan: 1. Functional deficits secondary to right BKA left transmetatarsal amputation which require 3+ hours per day of interdisciplinary therapy in a comprehensive inpatient rehab setting.  Physiatrist is providing close team supervision and 24 hour management of active medical problems listed below.  Physiatrist and rehab team continue to assess barriers to discharge/monitor patient progress toward functional and medical goals  Care Tool:  Bathing    Body parts bathed by patient: Right arm, Left arm, Chest, Abdomen, Right upper leg, Left upper leg, Front perineal area,  Right lower leg, Face, Buttocks   Body parts bathed by helper: Buttocks Body parts n/a: Left lower leg   Bathing assist Assist Level: Supervision/Verbal cueing     Upper Body Dressing/Undressing Upper body dressing   What is the patient wearing?: Pull over shirt    Upper body assist Assist Level: Set up assist    Lower Body Dressing/Undressing Lower body dressing      What is the patient wearing?: Pants, Ace wrap/stump shrinker, Underwear/pull up     Lower body assist Assist for lower body dressing: Minimal Assistance - Patient > 75%     Toileting Toileting    Toileting assist Assist for toileting: Independent with assistive device Assistive Device Comment: urinal   Transfers Chair/bed transfer  Transfers assist     Chair/bed transfer assist level: Contact Guard/Touching assist     Locomotion Ambulation   Ambulation assist      Assist level: Contact Guard/Touching assist Assistive device: Walker-rolling Max distance: 30   Walk 10 feet activity   Assist     Assist level: Contact Guard/Touching assist Assistive device: Walker-rolling   Walk 50 feet activity   Assist Walk 50 feet with 2 turns activity did not occur: Safety/medical concerns         Walk 150 feet activity   Assist Walk 150 feet activity did not occur: Safety/medical concerns         Walk 10 feet on uneven surface  activity   Assist Walk 10 feet on uneven surfaces activity did not occur: Safety/medical concerns         Wheelchair     Assist Will patient use wheelchair at discharge?: Yes Type of Wheelchair: Manual    Wheelchair assist level: Supervision/Verbal cueing Max wheelchair distance:  200    Wheelchair 50 feet with 2 turns activity    Assist        Assist Level: Set up assist   Wheelchair 150 feet activity     Assist     Assist Level: Set up assist      Medical Problem List and Plan: 1.  Decreased functional mobility  secondary to right below-knee amputation 06/19/2018 as well as history of left transmetatarsal amputation 05/10/2016  Continue CIR PT, OT, advised pt to have family bring in spacer for left shoe 2.  DVT Prophylaxis/Anticoagulation: SCDs left lower extremity 3. Pain Management: Robaxin and oxycodone as needed 4. Mood: Provide emotional support 5. Neuropsych: This patient is capable of making decisions on his own behalf. 6. Skin/Wound Care: Routine skin checks  DC VAC today 7. Fluids/Electrolytes/Nutrition: Routine in and outs 8.  Acute blood loss anemia.    Hemoglobin 9.3 on 11/5 9.  Diabetes mellitus with peripheral neuropathy.  Hemoglobin A1c 15.9.  NovoLog 70/30 20 units twice daily.  Patient also on Janumet 50-1000 mg twice daily prior to admission.  Resume as needed check blood sugars before meals and at bedtime.  Diabetic teaching.  Patient needs blood glucose monitor at discharge CBG (last 3)  Recent Labs    06/26/18 1644 06/26/18 2112 06/27/18 0626  GLUCAP 114* 185* 183*  CBGs on higher side this am will monitor on current meds for now 10.  Hypertension.  Patient on lisinopril 2.5 mg daily prior to admission.   Trending up, metoprolol 25 started on 11/7 Vitals:   06/26/18 1946 06/27/18 0426  BP: 120/87 (!) 146/92  Pulse: 96 93  Resp: 17 18  Temp: 98.6 F (37 C) 97.8 F (36.6 C)  SpO2: 99% 100%  Cont to monitor on current dose 11.  Constipation.  Laxative assistance 12.  CKD  Creatinine 1.52 on 11/5  Continue to monitor 15.  Hypoalbuminemia  Supplement initiated on 11/5  LOS: 5 days A FACE TO FACE EVALUATION WAS PERFORMED  Charlett Blake 06/27/2018, 6:44 AM

## 2018-06-28 ENCOUNTER — Inpatient Hospital Stay (HOSPITAL_COMMUNITY): Payer: Medicaid Other

## 2018-06-28 ENCOUNTER — Inpatient Hospital Stay (HOSPITAL_COMMUNITY): Payer: Medicaid Other | Admitting: Physical Therapy

## 2018-06-28 LAB — GLUCOSE, CAPILLARY
GLUCOSE-CAPILLARY: 58 mg/dL — AB (ref 70–99)
GLUCOSE-CAPILLARY: 98 mg/dL (ref 70–99)
Glucose-Capillary: 159 mg/dL — ABNORMAL HIGH (ref 70–99)
Glucose-Capillary: 119 mg/dL — ABNORMAL HIGH (ref 70–99)
Glucose-Capillary: 156 mg/dL — ABNORMAL HIGH (ref 70–99)

## 2018-06-28 MED ORDER — INSULIN ASPART PROT & ASPART (70-30 MIX) 100 UNIT/ML ~~LOC~~ SUSP
20.0000 [IU] | Freq: Every day | SUBCUTANEOUS | Status: DC
  Administered 2018-06-28 – 2018-06-29 (×2): 20 [IU] via SUBCUTANEOUS

## 2018-06-28 MED ORDER — INSULIN ASPART PROT & ASPART (70-30 MIX) 100 UNIT/ML ~~LOC~~ SUSP
17.0000 [IU] | Freq: Every day | SUBCUTANEOUS | Status: DC
  Administered 2018-06-29 – 2018-06-30 (×2): 17 [IU] via SUBCUTANEOUS
  Filled 2018-06-28 (×2): qty 10

## 2018-06-28 NOTE — Progress Notes (Signed)
Occupational Therapy Session Note  Patient Details  Name: Edward [name] MRN: [id number] Date of Birth: November 14, 1970  Today's Date: 06/28/2018 OT Individual Time: 1435-1535 OT Individual Time Calculation (min): 60 min    Short Term Goals: Week 1:  OT Short Term Goal 1 (Week 1): Pt will complete squat pivot transfers to drop arm BSC with supervision OT Short Term Goal 2 (Week 1): Pt will complete LB dressing with supervision at sit > stand level OT Short Term Goal 3 (Week 1): Pt will complete functional ambulation to toilet with RW with supervision  Skilled Therapeutic Interventions/Progress Updates:    1:1. Pt agreeable to tx with no c/o pain. IN ADL kitchen, pt completes kitchen mobility at w/c level reaching into stove/ refrigerator with VC for w/c placement/w/c parts management and hemi technqiue in tight spaces. Pt educated on use of oven rack puller to increase safety with pulling tems out of oven. Pt demo good safety awareness and is able to lock/unlock w/c brakes appropriately with min VC. Pt completes toileting B&B void on standard toilet with grab bar and CGA for stand pivot/clothing management. Pt completes 4x8 bean bag corn hole toss with CGA for balance in standing reaching laterally and crossing midline in min ranges outside BOS. Exited session with pt seated in EOB, call light in reach and all needs met  Therapy Documentation Precautions:  Precautions Precautions: Fall Required Braces or Orthoses: Other Brace/Splint Other Brace/Splint: Limb guard/stump protector/positioning splint Restrictions Weight Bearing Restrictions: Yes RLE Weight Bearing: Non weight bearing   Therapy/Group: Individual Therapy  Tonny Branch 06/28/2018, 3:30 PM

## 2018-06-28 NOTE — Plan of Care (Signed)
  Problem: RH Stairs Goal: LTG Patient will ambulate up and down stairs w/assist (PT) Description LTG: Patient will ambulate up and down # of stairs with assistance (PT) Outcome: Not Applicable Flowsheets (Taken 06/28/2018 1002) LTG: Pt will ambulate up/down stairs assist needed:: -- (goal d/c; pt to bump up/down step to enter home) Note:  D/c goal; pt to bump up/down step in w/c for home entry

## 2018-06-28 NOTE — Progress Notes (Signed)
[facility] PHYSICAL MEDICINE & REHABILITATION PROGRESS NOTE  Subjective/Complaints:  Had "panic attack this am"  Coughing and had to "get out of room"   ROS: Denies CP, shortness of breath, nausea, vomiting.  Objective: Vital Signs: Blood pressure (!) 131/91, pulse 87, temperature 98 F (36.7 C), temperature source Oral, resp. rate 19, SpO2 94 %. No results found. No results for input(s): WBC, HGB, HCT, PLT in the last 72 hours. No results for input(s): NA, K, CL, CO2, GLUCOSE, BUN, CREATININE, CALCIUM in the last 72 hours.  Physical Exam: BP (!) 131/91 (BP Location: Left Arm)   Pulse 87   Temp 98 F (36.7 C) (Oral)   Resp 19   SpO2 94%  Constitutional: Well-developedand well nourished. NAD. HENT: Normocephalicand atraumatic.  Eyes:EOMI. No discharge.  Cardiovascular:RRR.  No JVD. Respiratory:Effort normal and breath sounds normal.  GI: Bowel sounds are normal. He exhibitsno distension.  Musculoskeletal:Right BKA with tenderness, stable Left transmet amputation, stable Neurological: He isalertand oriented. Motor: B/l UE 4+/5 proximal to distal LLE: 4+/5 proximal to distal RLE: HF 4+/5 proximal to distal Skin:Left transmetatarsal amputation site well-healed.  Right BKA, staples intact no drainage, mild edema Psychiatric: He has anormal mood and affect. Hisbehavior is normal.Thought contentnormal.   Assessment/Plan: 1. Functional deficits secondary to right BKA left transmetatarsal amputation which require 3+ hours per day of interdisciplinary therapy in a comprehensive inpatient rehab setting.  Physiatrist is providing close team supervision and 24 hour management of active medical problems listed below.  Physiatrist and rehab team continue to assess barriers to discharge/monitor patient progress toward functional and medical goals  Care Tool:  Bathing    Body parts bathed by patient: Right arm, Left arm, Chest, Abdomen, Right upper leg, Left upper  leg, Front perineal area, Right lower leg, Face, Buttocks   Body parts bathed by helper: Buttocks Body parts n/a: Left lower leg   Bathing assist Assist Level: Supervision/Verbal cueing     Upper Body Dressing/Undressing Upper body dressing   What is the patient wearing?: Pull over shirt    Upper body assist Assist Level: Set up assist    Lower Body Dressing/Undressing Lower body dressing      What is the patient wearing?: Pants, Ace wrap/stump shrinker, Underwear/pull up     Lower body assist Assist for lower body dressing: Minimal Assistance - Patient > 75%     Toileting Toileting    Toileting assist Assist for toileting: Moderate Assistance - Patient 50 - 74% Assistive Device Comment: urinal   Transfers Chair/bed transfer  Transfers assist     Chair/bed transfer assist level: Minimal Assistance - Patient > 75%     Locomotion Ambulation   Ambulation assist      Assist level: Contact Guard/Touching assist Assistive device: Walker-rolling Max distance: 30   Walk 10 feet activity   Assist     Assist level: Contact Guard/Touching assist Assistive device: Walker-rolling   Walk 50 feet activity   Assist Walk 50 feet with 2 turns activity did not occur: Safety/medical concerns         Walk 150 feet activity   Assist Walk 150 feet activity did not occur: Safety/medical concerns         Walk 10 feet on uneven surface  activity   Assist Walk 10 feet on uneven surfaces activity did not occur: Safety/medical concerns         Wheelchair     Assist Will patient use wheelchair at discharge?: Yes Type of Wheelchair: Manual  Wheelchair assist level: Supervision/Verbal cueing Max wheelchair distance: 200    Wheelchair 50 feet with 2 turns activity    Assist        Assist Level: Set up assist   Wheelchair 150 feet activity     Assist     Assist Level: Set up assist      Medical Problem List and Plan: 1.   Decreased functional mobility secondary to right below-knee amputation 06/19/2018 as well as history of left transmetatarsal amputation 05/10/2016  Continue CIR PT, OT, advised pt to have family bring in spacer for left shoe 2.  DVT Prophylaxis/Anticoagulation: SCDs left lower extremity 3. Pain Management: Robaxin and oxycodone as needed 4. Mood: Provide emotional support, had anxiety episode this am, rec Neuropsych next week 5. Neuropsych: This patient is capable of making decisions on his own behalf. 6. Skin/Wound Care: Routine skin checks Vac removed  7. Fluids/Electrolytes/Nutrition: Routine in and outs 8.  Acute blood loss anemia.    Hemoglobin 9.3 on 11/5 9.  Diabetes mellitus with peripheral neuropathy.  Hemoglobin A1c 15.9.  NovoLog 70/30 20 units twice daily.  Patient also on Janumet 50-1000 mg twice daily prior to admission.  Resume as needed check blood sugars before meals and at bedtime.  Diabetic teaching.  Patient needs blood glucose monitor at discharge CBG (last 3)  Recent Labs    06/27/18 1657 06/27/18 2203 06/28/18 0653  GLUCAP 303* 134* 159*  CBGs labile- would avoid metformin due to Creat  10.  Hypertension.  Patient on lisinopril 2.5 mg daily prior to admission.   metoprolol 25 started on 11/7 Vitals:   06/27/18 2000 06/28/18 0540  BP: 125/75 (!) 131/91  Pulse: 92 87  Resp: 17 19  Temp: 98.3 F (36.8 C) 98 F (36.7 C)  SpO2: 99% 94%  Cont to monitor on current dose 11/10 11.  Constipation.  Laxative assistance 12.  CKD  Creatinine 1.52 on 11/5  Continue to monitor 15.  Hypoalbuminemia  Supplement initiated on 11/5  LOS: 6 days A FACE TO FACE EVALUATION WAS PERFORMED  Charlett Blake 06/28/2018, 7:50 AM

## 2018-06-28 NOTE — Progress Notes (Signed)
Occupational Therapy Session Note  Patient Details  Name: Edward [name] MRN: [id number] Date of Birth: [birth year]  Today's Date: 06/28/2018 OT Individual Time: 7116-5790 OT Individual Time Calculation (min): 70 min    Short Term Goals: Week 1:  OT Short Term Goal 1 (Week 1): Pt will complete squat pivot transfers to drop arm BSC with supervision OT Short Term Goal 2 (Week 1): Pt will complete LB dressing with supervision at sit > stand level OT Short Term Goal 3 (Week 1): Pt will complete functional ambulation to toilet with RW with supervision  Skilled Therapeutic Interventions/Progress Updates:    1:1. Pt with no c/o pain Pt agreeable to shower and dress. Pt doffs clothing with S for UB and CGA to advance pants past hips in standing with RW. OT applies plastic cover to residual limb and pt set up with shower needs. Pt completes shower transfer with VC for w/c set up as well as hand placement on grab bar for safety. Pt bathes with distant supervision for lateral leans. PT dresses in w/c with set up for UB and CGA for LB dressing sit to stand with VC for donning non skid socks prior to standing on slick floor and bare feet to decrease risk of falling. Pt completes oral care with set up at sink seated. Pt reporting wanting cup of coffee. In ADL apartment pt reaches in cabinets to obtain items to brew cup of drip coffee with VC for sliding coffee post across table or use hemi technique to steer w/c while carrying items. Pt reporting feeling like blood sugar was low. Pt returned to room after coffee brewed and returned to bed for NT to check sugar. Lunch tray/call light set up with pt EOB and exit alarm on as OT exited room  Therapy Documentation Precautions:  Precautions Precautions: Fall Required Braces or Orthoses: Other Brace/Splint Other Brace/Splint: Limb guard/stump protector/positioning splint Restrictions Weight Bearing Restrictions: Yes RLE Weight Bearing: Non weight  bearing   Therapy/Group: Individual Therapy  Tonny Branch 06/28/2018, 11:00 AM

## 2018-06-28 NOTE — Significant Event (Signed)
Hypoglycemic Event  CBG: 58  Treatment: Patient eating lunch  Symptoms: Shaky  Follow-up CBG: Time:1238 CBG Result:98  Possible Reasons for Event: Unknown  Comments/MD notified:MD Kirsteins notified. New order received    Edward [name], [name]

## 2018-06-28 NOTE — Progress Notes (Signed)
Physical Therapy Session Note  Patient Details  Name: Edward [name] MRN: [id number] Date of Birth: 1971-03-13  Today's Date: 06/28/2018 PT Individual Time: 0900-1000 PT Individual Time Calculation (min): 60 min   Short Term Goals: Week 1:  PT Short Term Goal 1 (Week 1): Pt will perform functional transfers with supervision  PT Short Term Goal 2 (Week 1): Pt will ambulate 51' with RW and S PT Short Term Goal 3 (Week 1): Pt will initiate curb step training with LRAD   Skilled Therapeutic Interventions/Progress Updates: Pt received in bed, denies pain at rest, pre-medicated, and agreeable to treatment. Supine>sit modI. Performed upper/lower body dressing setupA to retrieve clothing, increased time. Bed/mat table/nustep <>w/c with S throughout session. Pt managed carrying tray out of room to hall cart for practice managing items while pushing w/c, min cues for technique/problem solving. W/c propulsion modI throughout session. Gait x15', x25' with RW and min guard; educated pt re: goals downgraded to S for ambulation and decreased distance d/t poor endurance. Also discussed energy conservation for bathroom trips as pt has to ambulate in/out of bathroom as w/c does not fit; talked about planning ahead to toilet/shower/hygiene in one trip to reduce the amount of trips to bathroom. Discussed home entry with one 6" step, one threshold step. Demonstrated bumping up/down in w/c, and with RW. Pt reports likely will hop up one step, then take w/c up threshold step to be in w/c inside house. Pt attempted ascent of 3" platform step; very fearful with moving RW onto step, then unable to push down through RW to attempt hop up onto step. Pt then demo'ed sitting in w/c how to bump up/down in w/c; pt reports he will plan for w/c entry up both steps into home, also states sons may work on ramp for entry. Pt reports great visit with peer support visitors yesterday. Educated re: aerobic endurance and increased energy demand  for ambulating with prosthetic; discussed methods for improving aerobic endurance prior to prosthetic fitting/training. Nustep performed x7 min level 5 decreased to 4 d/t fatigue, BUE/LLE. Returned to room modI w/c propulsion at end of session.      Therapy Documentation Precautions:  Precautions Precautions: Fall Required Braces or Orthoses: Other Brace/Splint Other Brace/Splint: Limb guard/stump protector/positioning splint Restrictions Weight Bearing Restrictions: Yes RLE Weight Bearing: Non weight bearing Pain: Pain Assessment Pain Scale: 0-10 Pain Score: 6  Pain Type: Surgical pain Pain Location: Leg Pain Orientation: Right Pain Descriptors / Indicators: Throbbing Pain Onset: On-going Pain Intervention(s): Medication (See eMAR)    Therapy/Group: Individual Therapy  Corliss Skains 06/28/2018, 10:01 AM

## 2018-06-29 ENCOUNTER — Inpatient Hospital Stay (HOSPITAL_COMMUNITY): Payer: Medicaid Other | Admitting: Occupational Therapy

## 2018-06-29 ENCOUNTER — Inpatient Hospital Stay (HOSPITAL_COMMUNITY): Payer: Medicaid Other | Admitting: Physical Therapy

## 2018-06-29 ENCOUNTER — Inpatient Hospital Stay (HOSPITAL_COMMUNITY): Payer: Medicaid Other

## 2018-06-29 DIAGNOSIS — D62 Acute posthemorrhagic anemia: Secondary | ICD-10-CM

## 2018-06-29 DIAGNOSIS — F418 Other specified anxiety disorders: Secondary | ICD-10-CM

## 2018-06-29 DIAGNOSIS — R4589 Other symptoms and signs involving emotional state: Secondary | ICD-10-CM

## 2018-06-29 LAB — GLUCOSE, CAPILLARY
GLUCOSE-CAPILLARY: 65 mg/dL — AB (ref 70–99)
GLUCOSE-CAPILLARY: 75 mg/dL (ref 70–99)
GLUCOSE-CAPILLARY: 51 mg/dL — AB (ref 70–99)
Glucose-Capillary: 143 mg/dL — ABNORMAL HIGH (ref 70–99)
Glucose-Capillary: 107 mg/dL — ABNORMAL HIGH (ref 70–99)

## 2018-06-29 NOTE — Progress Notes (Signed)
Occupational Therapy Session Note  Patient Details  Name: Edward [name] MRN: [id number] Date of Birth: 06/12/1971  Today's Date: 06/29/2018 OT Individual Time: 0900-1000 OT Individual Time Calculation (min): 60 min    Short Term Goals: Week 1:  OT Short Term Goal 1 (Week 1): Pt will complete squat pivot transfers to drop arm BSC with supervision OT Short Term Goal 2 (Week 1): Pt will complete LB dressing with supervision at sit > stand level OT Short Term Goal 3 (Week 1): Pt will complete functional ambulation to toilet with RW with supervision  Skilled Therapeutic Interventions/Progress Updates:    Pt presents sitting up in w/c with no c/o pain agreeable to therapy session. Pt waiting for son to bring additional clothing items therefore bathing deferred this session. Pt completed grooming ADLs modI from w/c level at sink. Pt self propels w/c and manages w/c parts throughout session at modI level. Transitioned to ADL apartment for further discussion re: d/c planning and DME needs. Pt reports has walk-in shower at home. Noted pt with difficulty performing hop onto 3" step during PT session yesterday. Discussed safe transfer options into/out of shower consider shower has small lip to step over. Demo'ed option of shower chair vs tub transfer bench. For increased safety and to eliminate need for hopping over shower stall practiced use of TTB for transfer. Pt ambulating approx 6' from w/c using RW (to simulate distance from doorway of bathroom to shower) and performing shower transfer via TTB with overall close S-CGA. Pt returned to w/c in same manner, practicing taking side steps using RW and taking few hops backwards as prep for maneuvering in smaller bathroom setup at home. Pt propelled to dayroom, use of arm bike x10 min for UB strengthening/endurance propelling forwards/backwards at level 3. Returned to therapy gym and pt engaged in bil UE exercise using 5lb dowel rod across multiple planes x10 reps  each. Pt returned to room, performing squat pivot transfer w/c>EOB with close S where pt was left seated EOB with son present, RN present to administer meds, bed alarm set and needs met.   Therapy Documentation Precautions:  Precautions Precautions: Fall Required Braces or Orthoses: Other Brace/Splint Other Brace/Splint: Limb guard/stump protector/positioning splint Restrictions Weight Bearing Restrictions: Yes RLE Weight Bearing: Non weight bearing    Pain: reports at most 5/10 pain start of session, pt with occasional pain during session, alleviated with repositioning, RN present to provide pain meds end of session.     Therapy/Group: Individual Therapy  [name] 06/29/2018, 9:10 AM

## 2018-06-29 NOTE — Plan of Care (Signed)
  Problem: Spiritual Needs Goal: Ability to function at adequate level Description Pt to decide prior to discharge if he wishes to complete advance directives.  Outcome: Progressing   Problem: Consults Goal: RH GENERAL PATIENT EDUCATION Description See Patient Education module for education specifics. Outcome: Progressing Goal: Skin Care Protocol Initiated - if Braden Score 18 or less Description If consults are not indicated, leave blank or document N/A Outcome: Progressing Goal: Diabetes Guidelines if Diabetic/Glucose > 140 Description If diabetic or lab glucose is > 140 mg/dl - Initiate Diabetes/Hyperglycemia Guidelines & Document Interventions  Outcome: Progressing   Problem: RH BOWEL ELIMINATION Goal: RH STG MANAGE BOWEL WITH ASSISTANCE Description STG Manage Bowel with mod I Assistance.  Outcome: Progressing Goal: RH STG MANAGE BOWEL W/MEDICATION W/ASSISTANCE Description STG Manage Bowel with Medication with Assistance. Mod I  Outcome: Progressing   Problem: RH BLADDER ELIMINATION Goal: RH STG MANAGE BLADDER WITH ASSISTANCE Description STG Manage Bladder With mod I Assistance  Outcome: Progressing   Problem: RH SKIN INTEGRITY Goal: RH STG SKIN FREE OF INFECTION/BREAKDOWN Description Free of infection and breakdown with min assist  Outcome: Progressing   Problem: RH SAFETY Goal: RH STG ADHERE TO SAFETY PRECAUTIONS W/ASSISTANCE/DEVICE Description STG Adhere to Safety Precautions With Assistance/Device. Mod I  Outcome: Progressing   Problem: RH PAIN MANAGEMENT Goal: RH STG PAIN MANAGED AT OR BELOW PT'S PAIN GOAL Description Pain scale <4  Outcome: Progressing   Problem: RH KNOWLEDGE DEFICIT GENERAL Goal: RH STG INCREASE KNOWLEDGE OF SELF CARE AFTER HOSPITALIZATION Description Patient will be able to demonstrate care of residual limb with hand outs/cues  Outcome: Progressing

## 2018-06-29 NOTE — Progress Notes (Signed)
Physical Therapy Session Note  Patient Details  Name: Edward [name] MRN: [id number] Date of Birth: March 17, 1971  Today's Date: 06/29/2018 PT Individual Time: 0800-0900 PT Individual Time Calculation (min): 60 min   Short Term Goals: Week 1:  PT Short Term Goal 1 (Week 1): Pt will perform functional transfers with supervision  PT Short Term Goal 2 (Week 1): Pt will ambulate 15' with RW and S PT Short Term Goal 3 (Week 1): Pt will initiate curb step training with LRAD       Skilled Therapeutic Interventions/Progress Updates:  Pt sitting EOB, having already donned shrinker sock RLE.  Squat pivot bed> wc to L with CGA.  Cues for locking brakes as he leaned over to pick up legrests from floor and connect them to w/c.  W/c propulsion on level tile with cues for efficiency and turns.  Supine Therapeutic exercise performed with LE to increase strength for functional mobility: 10 x 2 each L straight leg raises, L unilateral bridging, R short arc quad knee extension with quad set at end range. In R/L side lying, L/R hip abduction with hips and knees flexed.  Prone positioning for sustained stretch bil hip flexors, x 2 minutes.     PT instructed pt in diaphragmatic breathing, with good results. Pt reported that his waking up at night due to anxiety, and having difficulty going back to sleep.  He has reported this to MD. PT instructed pt in relaxation imagery with deep breathing.  Gait training with RW on level tile x40' including turns L and R.  Pt left resting in w/c with needs at hand.    Therapy Documentation Precautions:  Precautions Precautions: Fall Required Braces or Orthoses: Other Brace/Splint Other Brace/Splint: Limb guard/stump protector/positioning splint Restrictions Weight Bearing Restrictions: Yes RLE Weight Bearing: Non weight bearing   Pain: 6/10 R residual limb; requested pain meds      Therapy/Group: Individual Therapy  [name] 06/29/2018, 8:59 AM

## 2018-06-29 NOTE — Progress Notes (Signed)
Physical Therapy Session Note  Patient Details  Name: Edward [name] MRN: [id number] Date of Birth: [birth year]  Today's Date: 06/29/2018 PT Individual Time: 1300-1415 PT Individual Time Calculation (min): 75 min   Short Term Goals: Week 1:  PT Short Term Goal 1 (Week 1): Pt will perform functional transfers with supervision  PT Short Term Goal 2 (Week 1): Pt will ambulate 9' with RW and S PT Short Term Goal 3 (Week 1): Pt will initiate curb step training with LRAD   Skilled Therapeutic Interventions/Progress Updates:    Pt received sitting EOB with son present; he attended session and participated in education/demonstration techniques for home access/mobility; pain 6/10 as described below and agreeable to PT treatment. Residual limb measured (15.5 cm at calf and 18 inch at thigh) for better fitting/smaller shrinker; orthotist contacted. S squat pivot transfers, CGA sit>stand, and ModI w/c propulsion throughout session. Able to manage w/c parts with set up assist. 2 CGA gait trials x 25' with RW and 2 trials of w/c navigation up/down curb to mimic stairs at home; first trial with SPT demonstrating sequence, technique, and assistance to provide pt and second trial with son providing assistance for each. Mirror therapy to reduce phantom limb pain and phantom limb sensation by "rewiring" neural patterns: ankle DF/PF, ankle circles, ankle inversion/eversion, seated marching; 2x15 each. Quadruped exercises for core and back strengthening: 1) unilateral single arm shoulder flexion, 2) unilateral hip extension; 1x15 each limb; 3) progressed to alternate UE/LE limbs simultaneously; 1x5 due to increased difficulty; 4) "push-ups" for tricep strengthening; required rest breaks after each set. Core strengthening exercises: mini crunches, reverse crunches, diagonal mini crunches. Pt remained in son's care for modI w/c propulsion to return to room.   Therapy Documentation Precautions:  Precautions Precautions:  Fall Required Braces or Orthoses: Other Brace/Splint Other Brace/Splint: Limb guard/stump protector/positioning splint Restrictions Weight Bearing Restrictions: Yes RLE Weight Bearing: Weight bearing as tolerated Vital Signs: Therapy Vitals Temp: 98.7 F (37.1 C) Temp Source: Oral Pulse Rate: 92 Resp: 20 BP: (!) 132/91 Patient Position (if appropriate): Sitting Oxygen Therapy SpO2: 100 % Pain: Pain Assessment Pain Score: 0-No pain Faces Pain Scale: Hurts even more Pain Type: Phantom pain;Surgical pain Pain Location: Leg Pain Orientation: Right Pain Descriptors / Indicators: Aching;Discomfort;Throbbing Pain Onset: On-going Pain Intervention(s): Repositioned;Relaxation;Therapeutic touch;Rest;Ambulation/increased activity    Therapy/Group: Individual Therapy  [place] Corinna Burkman 06/29/2018, 3:38 PM

## 2018-06-29 NOTE — Progress Notes (Signed)
[facility] PHYSICAL MEDICINE & REHABILITATION PROGRESS NOTE  Subjective/Complaints: Patient seen about to work with therapy this morning.  He states he slept well overnight.  He states he had a good weekend.  ROS: Denies CP, shortness of breath, nausea, vomiting.  Objective: Vital Signs: Blood pressure 121/67, pulse 95, temperature 98.1 F (36.7 C), temperature source Oral, resp. rate 18, SpO2 98 %. No results found. No results for input(s): WBC, HGB, HCT, PLT in the last 72 hours. No results for input(s): NA, K, CL, CO2, GLUCOSE, BUN, CREATININE, CALCIUM in the last 72 hours.  Physical Exam: BP 121/67 (BP Location: Left Arm)   Pulse 95   Temp 98.1 F (36.7 C) (Oral)   Resp 18   SpO2 98%  Constitutional: Well-developedand well nourished. NAD. HENT: Normocephalicand atraumatic.  Eyes:EOMI. No discharge.  Cardiovascular:RRR.  No JVD. Respiratory:Effort normal and breath sounds normal.  GI: Bowel sounds are normal. He exhibitsno distension.  Musculoskeletal:Right BKA with tenderness, improving Left transmet amputation, unchanged Neurological: He isalertand oriented. Motor: B/l UE 4+/5 proximal to distal LLE: 4+/5 proximal to distal RLE: HF 4+/5 proximal to distal Skin:Left transmetatarsal amputation site healed.  Right BKA with dressing C/D/I Psychiatric: He has anormal mood and affect. Hisbehavior is normal.Thought contentnormal.   Assessment/Plan: 1. Functional deficits secondary to right BKA left transmetatarsal amputation which require 3+ hours per day of interdisciplinary therapy in a comprehensive inpatient rehab setting.  Physiatrist is providing close team supervision and 24 hour management of active medical problems listed below.  Physiatrist and rehab team continue to assess barriers to discharge/monitor patient progress toward functional and medical goals  Care Tool:  Bathing    Body parts bathed by patient: Right arm, Left arm, Chest, Abdomen,  Right upper leg, Left upper leg, Front perineal area, Right lower leg, Face, Buttocks   Body parts bathed by helper: Buttocks Body parts n/a: Left lower leg   Bathing assist Assist Level: Supervision/Verbal cueing     Upper Body Dressing/Undressing Upper body dressing   What is the patient wearing?: Pull over shirt    Upper body assist Assist Level: Set up assist    Lower Body Dressing/Undressing Lower body dressing      What is the patient wearing?: Pants, Ace wrap/stump shrinker, Underwear/pull up     Lower body assist Assist for lower body dressing: Minimal Assistance - Patient > 75%     Toileting Toileting    Toileting assist Assist for toileting: Moderate Assistance - Patient 50 - 74% Assistive Device Comment: urinal   Transfers Chair/bed transfer  Transfers assist     Chair/bed transfer assist level: Contact Guard/Touching assist     Locomotion Ambulation   Ambulation assist      Assist level: Contact Guard/Touching assist Assistive device: Walker-rolling Max distance: 40   Walk 10 feet activity   Assist     Assist level: Contact Guard/Touching assist Assistive device: Walker-rolling   Walk 50 feet activity   Assist Walk 50 feet with 2 turns activity did not occur: Safety/medical concerns         Walk 150 feet activity   Assist Walk 150 feet activity did not occur: Safety/medical concerns         Walk 10 feet on uneven surface  activity   Assist Walk 10 feet on uneven surfaces activity did not occur: Safety/medical concerns         Wheelchair     Assist Will patient use wheelchair at discharge?: Yes Type of Wheelchair: Manual  Wheelchair assist level: Independent Max wheelchair distance: 180    Wheelchair 50 feet with 2 turns activity    Assist        Assist Level: Independent   Wheelchair 150 feet activity     Assist     Assist Level: Independent      Medical Problem List and  Plan: 1.  Decreased functional mobility secondary to right below-knee amputation 06/19/2018 as well as history of left transmetatarsal amputation 05/10/2016  Continue CIR    Weekend notes reviewed, discussed with physician 2.  DVT Prophylaxis/Anticoagulation: SCDs left lower extremity 3. Pain Management: Robaxin and oxycodone as needed 4. Mood: Provide emotional support  Had anxiety episode over the weekend 5. Neuropsych: This patient is capable of making decisions on his own behalf. 6. Skin/Wound Care: Routine skin checks  Vac removed  7. Fluids/Electrolytes/Nutrition: Routine in and outs 8.  Acute blood loss anemia.    Hemoglobin 9.3 on 11/5  Labs ordered for tomorrow 9.  Diabetes mellitus with peripheral neuropathy.  Hemoglobin A1c 15.9.  NovoLog 70/30 20 units twice daily.  Patient also on Janumet 50-1000 mg twice daily prior to admission.  Resume as needed check blood sugars before meals and at bedtime.  Diabetic teaching.  Patient needs blood glucose monitor at discharge CBG (last 3)  Recent Labs    06/28/18 1642 06/28/18 2113 06/29/18 0631  GLUCAP 119* 156* 143*   Appreciate diabetic coordinator education  CBGs labile on 11/11 10.  Hypertension.  Patient on lisinopril 2.5 mg daily prior to admission.   Metoprolol 25 started on 11/7 Vitals:   06/28/18 1752 06/28/18 1956  BP: 127/79 121/67  Pulse:  95  Resp:  18  Temp:  98.1 F (36.7 C)  SpO2:  98%   Controlled on 11/11 11.  Constipation.  Laxative assistance 12.  CKD  Creatinine 1.52 on 11/5  Labs ordered for tomorrow  Continue to monitor 15.  Hypoalbuminemia  Supplement initiated on 11/5  LOS: 7 days A FACE TO FACE EVALUATION WAS PERFORMED  Aysa Larivee Lorie Phenix 06/29/2018, 9:47 AM

## 2018-06-30 ENCOUNTER — Inpatient Hospital Stay (HOSPITAL_COMMUNITY): Payer: Medicaid Other | Admitting: Occupational Therapy

## 2018-06-30 ENCOUNTER — Inpatient Hospital Stay (HOSPITAL_COMMUNITY): Payer: Medicaid Other

## 2018-06-30 ENCOUNTER — Inpatient Hospital Stay (HOSPITAL_COMMUNITY): Payer: Medicaid Other | Admitting: Physical Therapy

## 2018-06-30 DIAGNOSIS — E162 Hypoglycemia, unspecified: Secondary | ICD-10-CM

## 2018-06-30 LAB — CBC WITH DIFFERENTIAL/PLATELET
ABS IMMATURE GRANULOCYTES: 0.07 10*3/uL (ref 0.00–0.07)
BASOS PCT: 1 %
Basophils Absolute: 0.1 10*3/uL (ref 0.0–0.1)
Eosinophils Absolute: 0.3 10*3/uL (ref 0.0–0.5)
Eosinophils Relative: 5 %
HEMATOCRIT: 32.1 % — AB (ref 39.0–52.0)
HEMOGLOBIN: 9.8 g/dL — AB (ref 13.0–17.0)
Immature Granulocytes: 1 %
LYMPHS ABS: 1.9 10*3/uL (ref 0.7–4.0)
Lymphocytes Relative: 26 %
MCH: 26.1 pg (ref 26.0–34.0)
MCHC: 30.5 g/dL (ref 30.0–36.0)
MCV: 85.6 fL (ref 80.0–100.0)
MONO ABS: 0.6 10*3/uL (ref 0.1–1.0)
MONOS PCT: 9 %
NEUTROS ABS: 4.4 10*3/uL (ref 1.7–7.7)
Neutrophils Relative %: 58 %
PLATELETS: 448 10*3/uL — AB (ref 150–400)
RBC: 3.75 MIL/uL — ABNORMAL LOW (ref 4.22–5.81)
RDW: 13.2 % (ref 11.5–15.5)
WBC: 7.4 10*3/uL (ref 4.0–10.5)
nRBC: 0 % (ref 0.0–0.2)

## 2018-06-30 LAB — GLUCOSE, CAPILLARY
GLUCOSE-CAPILLARY: 87 mg/dL (ref 70–99)
Glucose-Capillary: 104 mg/dL — ABNORMAL HIGH (ref 70–99)
Glucose-Capillary: 133 mg/dL — ABNORMAL HIGH (ref 70–99)
Glucose-Capillary: 174 mg/dL — ABNORMAL HIGH (ref 70–99)
Glucose-Capillary: 86 mg/dL (ref 70–99)

## 2018-06-30 LAB — BASIC METABOLIC PANEL
ANION GAP: 3 — AB (ref 5–15)
BUN: 34 mg/dL — AB (ref 6–20)
CHLORIDE: 108 mmol/L (ref 98–111)
CO2: 26 mmol/L (ref 22–32)
Calcium: 8.6 mg/dL — ABNORMAL LOW (ref 8.9–10.3)
Creatinine, Ser: 1.81 mg/dL — ABNORMAL HIGH (ref 0.61–1.24)
GFR calc Af Amer: 49 mL/min — ABNORMAL LOW (ref >60)
GFR calc non Af Amer: 43 mL/min — ABNORMAL LOW (ref >60)
GLUCOSE: 114 mg/dL — AB (ref 70–99)
Potassium: 5.1 mmol/L (ref 3.5–5.1)
Sodium: 137 mmol/L (ref 135–145)

## 2018-06-30 MED ORDER — INSULIN ASPART PROT & ASPART (70-30 MIX) 100 UNIT/ML ~~LOC~~ SUSP
15.0000 [IU] | Freq: Every day | SUBCUTANEOUS | Status: DC
  Administered 2018-07-01: 15 [IU] via SUBCUTANEOUS
  Filled 2018-06-30: qty 10

## 2018-06-30 MED ORDER — INSULIN ASPART PROT & ASPART (70-30 MIX) 100 UNIT/ML ~~LOC~~ SUSP
15.0000 [IU] | Freq: Every day | SUBCUTANEOUS | Status: DC
  Administered 2018-06-30: 15 [IU] via SUBCUTANEOUS

## 2018-06-30 NOTE — Progress Notes (Signed)
Physical Therapy Session Note  Patient Details  Name: Edward [name] MRN: [id number] Date of Birth: 1970/11/17  Today's Date: 06/30/2018 PT Individual Time: 4142-3953 PT Individual Time Calculation (min): 70 min   Short Term Goals: Week 1:  PT Short Term Goal 1 (Week 1): Pt will perform functional transfers with supervision  PT Short Term Goal 2 (Week 1): Pt will ambulate 23' with RW and S PT Short Term Goal 3 (Week 1): Pt will initiate curb step training with LRAD   Skilled Therapeutic Interventions/Progress Updates:   Pt supine in bed upon PT arrival, agreeable to therapy tx and denies pain. Pt donned shrinker Mod I, donned shirt mod I and transferred to EOB Mod I. While seated EOB pt donned L shoe. Pt reports that he did not sleep at all last night but he will try to push through. Pt performed squat pivot transfer to w/c Mod I and propelled w/c to the gym x 150 ft Mod I. Pt transferred to mat squat pivot mod I. Pt instructed in home exercise program this session with handout provided. Pt reports his vision is blurry but he has reading glasses at home that he can use to follow handout. Pt performed exercises as part of HEP this session with therapist providing cues for techniques: 3 x 1 min prone on elbows for hip flexor stretch, 3 x 15 hip extension in prone, 3 x 15 sidelying hip abduction, 3 x 15 single leg bridges with towel roll and 3 x 1 minute long sitting hamstring stretch. Pt transferred to w/c mod I and propelled to dayroom. Pt used UE ergometer x 6 minutes alternating each minute forwards/backwards on level 7.0 working on UE endurance. Pt propelled back to room and then ambulated into bathroom with supervision, performed all clothing management and toileting with RW. Pt transferred to bed Mod I. Therapist provided education regarding donning the shrinker directly onto the skin without a bandage over the incision per doctors recommendations for healing. Pt left supine in bed with needs in  reach and bed alarm set.      Therapy Documentation Precautions:  Precautions Precautions: Fall Required Braces or Orthoses: Other Brace/Splint Other Brace/Splint: Limb guard/stump protector/positioning splint Restrictions Weight Bearing Restrictions: Yes RLE Weight Bearing: Non weight bearing    Therapy/Group: Individual Therapy  Netta Corrigan, PT, DPT 06/30/2018, 7:48 AM

## 2018-06-30 NOTE — Discharge Summary (Signed)
Discharge summary job [id number]

## 2018-06-30 NOTE — Progress Notes (Signed)
Physical Therapy Discharge Summary  Patient Details  Name: Edward [name] MRN: [id number] Date of Birth: 04/05/1971  Today's Date: 06/30/2018 PT Individual Time: 0177-9390 PT Individual Time Calculation (min): 45 min    Patient has met 7 of 10 long term goals due to improved activity tolerance, improved balance, improved postural control, increased strength and reduced fear of falling.  Patient to discharge at a wheelchair level Independent and ambulatory level CGA. Patient's care partner is independent to provide the necessary physical assistance at discharge.  Reasons goals not met: Decreased cardiovascular/UE endurance and decreased balance. Pt is able to ambulate 77' with RW and CGA, not 78' with RW and S.   Recommendation:  Patient will benefit from ongoing skilled PT services in home health setting to continue to advance safe functional mobility, address ongoing impairments in balance, strength, endurance, phantom limb sensations and pain, and minimize fall risk.  Equipment: Amputee Pad. Pt owns manual w/c and RW already.  Reasons for discharge: discharge from hospital  Patient/family agrees with progress made and goals achieved: Yes   Skilled Therapy: Pt received sitting EOB; pain as described below and agreeable to PT session. PT discharge completed as described below. Pt demonstrated improved cardiovascular endurance, UE/LE strength, and balance for functional mobility. Pt education provided for desensitization techniques for residual limb, care and footwear for intact foot, shrinker wear time, how to properly clean/dry shrinker, and proper positioning of residual limb. Pt reports no further questions regarding d/c home at this time. Pt remained seated in w/c with all needs in reach at the end of the session.  PT Discharge Precautions/Restrictions Precautions Precautions: Fall Other Brace/Splint: Limb guard/stump protector/positioning splint Restrictions Weight Bearing  Restrictions: Yes RLE Weight Bearing: Non weight bearing Pain Pain Assessment Pain Scale: 0-10 Pain Score: 8  Pain Type: Surgical pain;Phantom pain Pain Location: Leg Pain Orientation: Right Pain Descriptors / Indicators: Aching;Discomfort;Tingling Pain Frequency: Intermittent Pain Onset: On-going Patients Stated Pain Goal: 4 Pain Intervention(s): Repositioned;Relaxation;Therapeutic touch;Rest;Ambulation/increased activity Vision/Perception  Perception Perception: Within Functional Limits Praxis Praxis: Intact  Cognition Overall Cognitive Status: Within Functional Limits for tasks assessed Orientation Level: Oriented X4 Attention: Sustained;Selective;Focused;Alternating Focused Attention: Appears intact Sustained Attention: Appears intact Selective Attention: Appears intact Alternating Attention: Appears intact Memory: Appears intact Awareness: Appears intact Problem Solving: Appears intact Sensation Sensation Light Touch: Appears Intact Additional Comments: numbness in L foot due to L metatarsal amputation in 04/2016 Coordination Gross Motor Movements are Fluid and Coordinated: No Coordination and Movement Description: uncoordinated movements due to R BKA, L transmetatarsal amputation Motor  Motor Motor: Within Functional Limits Motor - Discharge Observations: ModI with fuctional transfers due to improved balance, increased LE strength, and decreased fear of falling  Mobility Bed Mobility Bed Mobility: Rolling Right;Rolling Left;Right Sidelying to Sit;Sitting - Scoot to Edge of Bed;Sit to Sidelying Left Rolling Right: Independent Rolling Left: Independent Right Sidelying to Sit: Independent Sitting - Scoot to Edge of Bed: Independent Sit to Sidelying Left: Independent Transfers Transfers: Sit to Stand;Stand to Sit;Stand Pivot Transfers;Squat Pivot Transfers Sit to Stand: Independent with assistive device Stand to Sit: Independent with assistive device Stand Pivot  Transfers: Independent with assistive device Stand Pivot Transfer Details: Verbal cues for technique;Verbal cues for precautions/safety Squat Pivot Transfers: Independent with assistive device Transfer (Assistive device): Rolling walker(for stand pivot transfers) Locomotion  Gait Ambulation: Yes Gait Assistance: Contact Guard/Touching assist Gait Distance (Feet): 70 Feet Assistive device: Rolling walker Gait Gait: Yes Gait Pattern: Impaired Gait Pattern: ("hopping" due to R BKA) Gait velocity: decreased  Stairs / Additional Locomotion Stairs: Yes Stairs Assistance: Dependent - Patient 0% Stair Management Technique: Wheelchair Number of Stairs: 2 Height of Stairs: 6 Ramp: Independent with assistive device(manual w/c) Curb: Dependent - Patient 0%(manual w/c) Product manager Mobility: Yes Wheelchair Assistance: Independent with Camera operator: Both upper extremities Wheelchair Parts Management: Independent Distance: 500  Trunk/Postural Assessment  Cervical Assessment Cervical Assessment: Within Functional Limits Thoracic Assessment Thoracic Assessment: Within Functional Limits Lumbar Assessment Lumbar Assessment: Within Functional Limits Postural Control Postural Control: Within Functional Limits  Balance Balance Balance Assessed: Yes Static Sitting Balance Static Sitting - Balance Support: Bilateral upper extremity supported;Feet supported;No upper extremity supported Static Sitting - Level of Assistance: 7: Independent Dynamic Sitting Balance Dynamic Sitting - Balance Support: Feet supported;During functional activity;Bilateral upper extremity supported Dynamic Sitting - Level of Assistance: 7: Independent Reach (Patient is able to reach ___ inches to right, left, forward, back): able to reach and pick w/c parts up from floor Dynamic Sitting - Balance Activities: Forward lean/weight shifting;Lateral lean/weight shifting;Reaching for  objects;Reaching for weighted objects;Reaching across midline Sitting balance - Comments: able to perform upper body dressing independently Static Standing Balance Static Standing - Balance Support: Bilateral upper extremity supported;During functional activity Static Standing - Level of Assistance: 6: Modified independent (Device/Increase time) Dynamic Standing Balance Dynamic Standing - Balance Support: During functional activity;Bilateral upper extremity supported Dynamic Standing - Level of Assistance: 5: Stand by assistance Dynamic Standing - Balance Activities: Forward lean/weight shifting;Lateral lean/weight shifting Extremity Assessment  RUE Assessment RUE Assessment: Within Functional Limits Active Range of Motion (AROM) Comments: WFL LUE Assessment LUE Assessment: Within Functional Limits Active Range of Motion (AROM) Comments: WFL RLE Assessment RLE Assessment: Within Functional Limits General Strength Comments: grossly 4-5/5 for hip and knee LLE Assessment LLE Assessment: Within Functional Limits General Strength Comments: grossly 4-5/5    [place] Pleshette Tomasini 06/30/2018, 3:15 PM

## 2018-06-30 NOTE — Progress Notes (Addendum)
Social Work  Discharge Note  The overall goal for the admission was met for:   Discharge location: Yes-HOME WITH TWO SON'S WHO CAN BE THERE 24 HR IF NEEDED.  Length of Stay: Yes-9 DAYS  Discharge activity level: Yes-INDEPENDENT WITH DEVICE  Home/community participation: Yes  Services provided included: MD, RD, PT, OT, RN, CM, Pharmacy and SW  Financial Services: Medicaid  Follow-up services arranged: Home Health: [facility], DME: [facility] and Patient/Family request agency HH: [facility], DME: [facility] HAS OTHER EQUIPMENT FROM THEM  Comments (or additional information):PT DID WELL AND REACHED HIS GOALS OF MOD/I LEVEL. SON'S ARE THERE IF NEEDED.  Patient/Family verbalized understanding of follow-up arrangements: Yes  Individual responsible for coordination of the follow-up plan: SELF & SON'S  Confirmed correct DME delivered: Elease Hashimoto 06/30/2018    Elease Hashimoto

## 2018-06-30 NOTE — Progress Notes (Addendum)
[place] PHYSICAL MEDICINE & REHABILITATION PROGRESS NOTE  Subjective/Complaints: Patient seen sitting up in bed this morning.  He states he did not sleep well overnight because he was anxious.  He denies any symptoms during the day, only when he is resting.  He is looking forward to discharge tomorrow.  ROS: Denies CP, shortness of breath, nausea, vomiting.  Objective: Vital Signs: Blood pressure (!) 151/94, pulse 87, temperature 98.7 F (37.1 C), temperature source Oral, resp. rate (!) 24, weight 100 kg, SpO2 100 %. No results found. Recent Labs    06/30/18 0502  WBC 7.4  HGB 9.8*  HCT 32.1*  PLT 448*   Recent Labs    06/30/18 0502  NA 137  K 5.1  CL 108  CO2 26  GLUCOSE 114*  BUN 34*  CREATININE 1.81*  CALCIUM 8.6*    Physical Exam: BP (!) 151/94   Pulse 87   Temp 98.7 F (37.1 C) (Oral)   Resp (!) 24   Wt 100 kg   SpO2 100%   BMI 29.09 kg/m  Constitutional: Well-developedand well nourished. NAD. HENT: Normocephalicand atraumatic.  Eyes:EOMI. No discharge.  Cardiovascular:RRR.  No JVD. Respiratory:Effort normal and breath sounds normal.  GI: Bowel sounds are normal. He exhibitsno distension.  Musculoskeletal:Right BKA with tenderness, improving Left transmet amputation, unchanged Neurological: He isalertand oriented. Motor: B/l UE 4+/5 proximal to distal LLE: 4+/5 proximal to distal, stable RLE: HF 4+/5 proximal to distal, stable Skin:Left transmetatarsal amputation site healed.  Right BKA with minimal serosanguineous drainage Psychiatric: He has anormal mood and affect. Hisbehavior is normal.Thought contentnormal.   Assessment/Plan: 1. Functional deficits secondary to right BKA left transmetatarsal amputation which require 3+ hours per day of interdisciplinary therapy in a comprehensive inpatient rehab setting.  Physiatrist is providing close team supervision and 24 hour management of active medical problems listed  below.  Physiatrist and rehab team continue to assess barriers to discharge/monitor patient progress toward functional and medical goals  Care Tool:  Bathing    Body parts bathed by patient: Right arm, Left arm, Chest, Abdomen, Right upper leg, Left upper leg, Front perineal area, Right lower leg, Face, Buttocks   Body parts bathed by helper: Buttocks Body parts n/a: Left lower leg   Bathing assist Assist Level: Set up assist     Upper Body Dressing/Undressing Upper body dressing   What is the patient wearing?: Pull over shirt    Upper body assist Assist Level: Set up assist    Lower Body Dressing/Undressing Lower body dressing      What is the patient wearing?: Pants, Ace wrap/stump shrinker, Underwear/pull up     Lower body assist Assist for lower body dressing: Minimal Assistance - Patient > 75%     Toileting Toileting    Toileting assist Assist for toileting: Minimal Assistance - Patient > 75% Assistive Device Comment: urinal   Transfers Chair/bed transfer  Transfers assist     Chair/bed transfer assist level: Supervision/Verbal cueing     Locomotion Ambulation   Ambulation assist      Assist level: Contact Guard/Touching assist Assistive device: Walker-rolling Max distance: 25   Walk 10 feet activity   Assist     Assist level: Contact Guard/Touching assist Assistive device: Walker-rolling   Walk 50 feet activity   Assist Walk 50 feet with 2 turns activity did not occur: Safety/medical concerns         Walk 150 feet activity   Assist Walk 150 feet activity did not occur: Safety/medical  concerns         Walk 10 feet on uneven surface  activity   Assist Walk 10 feet on uneven surfaces activity did not occur: Safety/medical concerns         Wheelchair     Assist Will patient use wheelchair at discharge?: Yes Type of Wheelchair: Manual    Wheelchair assist level: Set up assist Max wheelchair distance: 200     Wheelchair 50 feet with 2 turns activity    Assist        Assist Level: Set up assist   Wheelchair 150 feet activity     Assist     Assist Level: Set up assist      Medical Problem List and Plan: 1.  Decreased functional mobility secondary to right below-knee amputation 06/19/2018 as well as history of left transmetatarsal amputation 05/10/2016  Continue CIR    Plan for d/c tomorrow  Will see patient for transitional care management in 1-2 weeks post-discharge 2.  DVT Prophylaxis/Anticoagulation: SCDs left lower extremity 3. Pain Management: Robaxin and oxycodone as needed 4. Mood: Provide emotional support  Anxiety, educated on coping mechanisms 5. Neuropsych: This patient is capable of making decisions on his own behalf. 6. Skin/Wound Care: Routine skin checks  Vac removed  7. Fluids/Electrolytes/Nutrition: Routine in and outs 8.  Acute blood loss anemia.    Hemoglobin 9.8 on 11/12 9.  Diabetes mellitus with peripheral neuropathy.  Hemoglobin A1c 15.9.  NovoLog 70/30 15 units twice daily PTA.  Patient also on Janumet 50-1000 mg twice daily prior to admission.  Resume as needed check blood sugars before meals and at bedtime.  Diabetic teaching.  Patient needs blood glucose monitor at discharge CBG (last 3)  Recent Labs    06/29/18 2028 06/30/18 0016 06/30/18 0606  GLUCAP 51* 174* 104*   Appreciate diabetic coordinator education  NovoLog 70/30 decreased to 15 twice daily on 11/12  Labile with hypoglycemia on 11/12 10.  Hypertension.  Patient on lisinopril 2.5 mg daily prior to admission.   Metoprolol 25 started on 11/7 Vitals:   06/30/18 0603 06/30/18 0658  BP: (!) 148/98 (!) 151/94  Pulse: 87 87  Resp: (!) 24   Temp: 98.7 F (37.1 C)   SpO2: 95% 100%   Labile on 11/12, likely related to anxiety 11.  Constipation.  Laxative assistance 12.  CKD  Creatinine 1.81 on 11/12  Encourage fluids  Continue to monitor 15.  Hypoalbuminemia  Supplement  initiated on 11/5  LOS: 8 days A FACE TO FACE EVALUATION WAS PERFORMED  Kadijah Shamoon Lorie Phenix 06/30/2018, 8:41 AM

## 2018-06-30 NOTE — Progress Notes (Signed)
Occupational Therapy Discharge Summary  Patient Details  Name: Edward [name] MRN: [id number] Date of Birth: 20-Sep-1970  Patient has met 7 of 10 long term goals due to improved activity tolerance, improved balance, postural control, ability to compensate for deficits and improved awareness.  Patient to discharge at overall Modified Independent level at w/c level and supervision when ambulating with RW.  Patient's care partner is independent to provide the necessary intermittent assistance at discharge.    Reasons goals not met: Pt's bathroom is not w/c accessible, therefore pt will require supervision when ambulating to toilet with RW.  Pt to utilize urinal when possible when he does not have supervision for ambulation.   Recommendation:  Patient will benefit from ongoing skilled OT services in home health setting to continue to advance functional skills in the area of BADL and Reduce care partner burden.  Equipment: tub transfer bench  Reasons for discharge: treatment goals met and discharge from hospital  Patient/family agrees with progress made and goals achieved: Yes  OT Discharge Precautions/Restrictions  Precautions Precautions: Fall Required Braces or Orthoses: Other Brace/Splint Other Brace/Splint: Limb guard/stump protector/positioning splint Restrictions Weight Bearing Restrictions: Yes RLE Weight Bearing: Non weight bearing General   Vital Signs Therapy Vitals Temp: 98.6 F (37 C) Temp Source: Oral Pulse Rate: 92 BP: (!) 141/88 Patient Position (if appropriate): Sitting Oxygen Therapy SpO2: 98 % O2 Device: Room Air Pain Pain Assessment Pain Scale: 0-10 Pain Score: 8  Pain Type: Surgical pain;Phantom pain Pain Location: Leg Pain Orientation: Right Pain Descriptors / Indicators: Aching;Discomfort;Tingling Pain Onset: On-going Pain Intervention(s): Repositioned;Shower ADL ADL Grooming: Modified independent Where Assessed-Grooming: Clinical biochemist  Bathing: Setup Where Assessed-Upper Body Bathing: Shower Lower Body Bathing: Setup, Supervision/safety Where Assessed-Lower Body Bathing: Shower Upper Body Dressing: Modified independent (Device) Where Assessed-Upper Body Dressing: Wheelchair Lower Body Dressing: Modified independent Where Assessed-Lower Body Dressing: Wheelchair Toileting: Supervision/safety Where Assessed-Toileting: Glass blower/designer: Close supervision Toilet Transfer Method: Magazine features editor: Close supervision Social research officer, government Method: Education officer, environmental: Gaffer Baseline Vision/History: Wears glasses Wears Glasses: Reading only Vision Assessment?: No apparent visual deficits Perception  Perception: Within Functional Limits Praxis Praxis: Intact Cognition Overall Cognitive Status: Within Functional Limits for tasks assessed Orientation Level: Oriented X4 Attention: Sustained;Selective;Focused;Alternating Focused Attention: Appears intact Sustained Attention: Appears intact Selective Attention: Appears intact Alternating Attention: Appears intact Memory: Appears intact Awareness: Appears intact Problem Solving: Appears intact Sensation Sensation Light Touch: Appears Intact Additional Comments: numbness in L foot due to L metatarsal amputation in 04/2016 Coordination Gross Motor Movements are Fluid and Coordinated: No Coordination and Movement Description: uncoordinated movements due to R BKA, L transmetatarsal amputation Motor  Motor Motor: Within Functional Limits Motor - Discharge Observations: ModI with fuctional transfers due to improved balance, increased LE strength, and decreased fear of falling Mobility  Bed Mobility Bed Mobility: Rolling Right;Rolling Left;Right Sidelying to Sit;Sitting - Scoot to [employer] of Bed;Sit to Sidelying Left Rolling Right: Independent Rolling Left: Independent Right Sidelying to Sit:  Independent Sitting - Scoot to Edge of Bed: Independent Sit to Sidelying Left: Independent Transfers Sit to Stand: Independent with assistive device Stand to Sit: Independent with assistive device  Trunk/Postural Assessment  Cervical Assessment Cervical Assessment: Within Functional Limits Thoracic Assessment Thoracic Assessment: Within Functional Limits Lumbar Assessment Lumbar Assessment: Within Functional Limits Postural Control Postural Control: Within Functional Limits  Balance Balance Balance Assessed: Yes Static Sitting Balance Static Sitting - Balance Support: Bilateral upper extremity supported;Feet supported;No upper extremity supported  Static Sitting - Level of Assistance: 7: Independent Dynamic Sitting Balance Dynamic Sitting - Balance Support: Feet supported;During functional activity;Bilateral upper extremity supported Dynamic Sitting - Level of Assistance: 7: Independent Reach (Patient is able to reach ___ inches to right, left, forward, back): able to reach and pick w/c parts up from floor Dynamic Sitting - Balance Activities: Forward lean/weight shifting;Lateral lean/weight shifting;Reaching for objects;Reaching for weighted objects;Reaching across midline Sitting balance - Comments: able to perform upper body dressing independently Static Standing Balance Static Standing - Balance Support: Bilateral upper extremity supported;During functional activity Static Standing - Level of Assistance: 6: Modified independent (Device/Increase time) Dynamic Standing Balance Dynamic Standing - Balance Support: During functional activity;Bilateral upper extremity supported Dynamic Standing - Level of Assistance: 5: Stand by assistance Dynamic Standing - Balance Activities: Forward lean/weight shifting;Lateral lean/weight shifting Extremity/Trunk Assessment RUE Assessment RUE Assessment: Within Functional Limits Active Range of Motion (AROM) Comments: WFL General Strength  Comments: strength grossly 4/5 LUE Assessment LUE Assessment: Within Functional Limits Active Range of Motion (AROM) Comments: WFL General Strength Comments: strength grossly 4+/5   Honora Searson, [facility] 06/30/2018, 4:14 PM

## 2018-06-30 NOTE — Progress Notes (Signed)
Occupational Therapy Session Note  Patient Details  Name: Edward [name] MRN: [id number] Date of Birth: 24-Nov-1970  Today's Date: 06/30/2018 OT Individual Time: 1445-1530 OT Individual Time Calculation (min): 45 min    Short Term Goals: Week 1:  OT Short Term Goal 1 (Week 1): Pt will complete squat pivot transfers to drop arm BSC with supervision OT Short Term Goal 2 (Week 1): Pt will complete LB dressing with supervision at sit > stand level OT Short Term Goal 3 (Week 1): Pt will complete functional ambulation to toilet with RW with supervision  Skilled Therapeutic Interventions/Progress Updates:    Completed ADL retraining at overall supervision - Mod I level.  Pt completed stand pivot transfer w/c > tub bench in room shower with close supervision and completed bathing with lateral leans after setup with all items.  Pt completed dressing at Mod I level with lateral leans to pull underwear and shorts over hips.  Ambulated 6-8' with RW to [facility] over toilet with supervision.  Pt completed clothing management up/down to simulate toileting needs with supervision in standing alternating UE support on RW.  Returned to bed with RW with supervision and left seated on EOB with all needs in reach.  Pt reports pleased with progress and feels prepared for d/c.  Therapy Documentation Precautions:  Precautions Precautions: Fall Required Braces or Orthoses: Other Brace/Splint Other Brace/Splint: Limb guard/stump protector/positioning splint Restrictions Weight Bearing Restrictions: Yes RLE Weight Bearing: Non weight bearing General:   Vital Signs: Therapy Vitals Temp: 98.6 F (37 C) Temp Source: Oral Pulse Rate: 92 BP: (!) 141/88 Patient Position (if appropriate): Sitting Oxygen Therapy SpO2: 98 % O2 Device: Room Air Pain: Pain Assessment Pain Scale: 0-10 Pain Score: 8  Pain Type: Surgical pain;Phantom pain Pain Location: Leg Pain Orientation: Right Pain Descriptors / Indicators:  Aching;Discomfort;Tingling Pain Onset: On-going Pain Intervention(s): Repositioned;Shower ADL: ADL Grooming: Modified independent Where Assessed-Grooming: Clinical biochemist Bathing: Setup Where Assessed-Upper Body Bathing: Shower Lower Body Bathing: Setup, Supervision/safety Where Assessed-Lower Body Bathing: Shower Upper Body Dressing: Modified independent (Device) Where Assessed-Upper Body Dressing: Wheelchair Lower Body Dressing: Modified independent Where Assessed-Lower Body Dressing: Teaching laboratory technician: Close supervision Social research officer, government Method: Education officer, environmental: Radio broadcast assistant   Therapy/Group: Individual Therapy  Simonne Come 06/30/2018, 4:08 PM

## 2018-06-30 NOTE — Discharge Summary (Addendum)
NAME: Edward [name], [place] MEDICAL RECORD [id number] ACCOUNT [id number] DATE OF BIRTH:[birth year] FACILITY: [facility] LOCATION: [facility] PHYSICIAN:Tomoko Sandra, MD  DISCHARGE SUMMARY  DATE OF DISCHARGE:  07/01/2018  DATE OF ADMISSION:  06/22/2018  DATE OF DISCHARGE:  07/01/2018   DISCHARGE DIAGNOSES: 1.  Right below knee amputation on 06/19/2018 as well as history of left transmetatarsal amputation 05/10/2016. 2.  Pain management. 3.  Acute blood loss anemia.   4.  Diabetes mellitus. 5.  Peripheral neuropathy. 6.  Hypertension 7.  Constipation. 8.  Chronic kidney disease.  HISTORY OF PRESENT ILLNESS:  This is a 47 year old right-handed male with history of diabetes mellitus, hypertension, tobacco abuse, left transmetatarsal amputation in 2017.  Lives with 2 sons.  Independent with assistive device prior to admission.   Presented 06/16/2018 with right foot infection with podiatry followup.  Underwent right fourth ray amputation 06/17/2018 per Dr. Earleen Newport.  Postoperatively with extensive infection requiring debridement of which limb was not felt to be salvageable and  underwent right transtibial amputation on 06/19/2018 per Dr. Sharol Given.  Wound VAC applied.  HOSPITAL COURSE:  Pain management.  [employer] consulted for preprosthetic training.  Acute blood loss anemia 10.4.  Therapy evaluations completed and the patient was admitted for comprehensive rehabilitation program.  PAST MEDICAL HISTORY:  See discharge diagnoses.  SOCIAL HISTORY:  Lives with 2 sons.  Independent with assistive device prior to admission.  FUNCTIONAL STATUS:  Upon admission to rehab services, minimal assist 4 feet rolling walker, minimal assist with stand pivot transfers, min mod assist with activities of daily living.  PHYSICAL EXAMINATION: VITAL SIGNS:  Blood pressure 164/99, pulse 101, temperature 98, respirations 18. GENERAL:  Alert male in no acute distress. HEENT:  EOMs intact. NECK:  Supple, nontender, no  JVD. CARDIOVASCULAR:  Rate controlled. ABDOMEN:  Soft, nontender, good bowel sounds. LUNGS:  Clear to auscultation. EXTREMITIES:  Right BKA site dressed, appropriately tender, left transmetatarsal amputation well-healed.  Wound VAC in place to right lower extremity.  REHABILITATION HOSPITAL COURSE:  The patient was admitted to inpatient rehabilitation services.  Therapies initiated on a 3-hour daily basis, consisting of physical therapy, occupational therapy and rehabilitation nursing.  The following issues were  addressed during patient's rehabilitation stay.  Pertaining to the patient's right below-knee amputation, wound VAC discontinued.  Follow up orthopedic services.  Well-healed left transmetatarsal amputation of 2017.  Oxycodone and Robaxin for pain  management.  Acute blood loss anemia 9.3.  No bleeding episodes.  Hemoglobin A1c of 15.9.  Insulin therapy as directed.  Full diabetic teaching.  Blood pressure is controlled, monitored.  The patient is on Lopressor.  CKD with creatinine 1.52.  The  patient received weekly collaborative interdisciplinary team conferences to discuss estimated length of stay, family teaching, any barriers to discharge.  Can sit at edge of bed with supervision.  Supervision squat pivot transfers -- contact guard  assist, sit to stand, modified independent to propel wheelchair and ambulate 25 feet rolling walker.  Gathered belongings for activities of daily living and homemaking.  Family teaching completed and planned discharge to home.  DISCHARGE MEDICATIONS:  Included NovoLog 70/30, 17 units at breakfast, 20 units at supper Lopressor 25 mg p.o. b.i.d., multivitamin daily, MiraLax daily, Senokot 1 tablet p.o. at bedtime, Robaxin 500 mg p.o. every 6 hours as needed for muscle spasms, janumet 50-1000 milligrams twice a day oxycodone 5-10 mg every 4 hours as needed for pain.  Diet was a diabetic diet.  The patient would follow up with Dr. Delice Lesch at  the outpatient  rehab service office as directed; Dr. Meridee Score, call for appointment; Dr. [name], medical management.  TN/NUANCE D:06/30/2018 T:06/30/2018 JOB:[id number]  Patient seen and examined by me on day of discharge. Delice Lesch, MD, ABPMR

## 2018-06-30 NOTE — Progress Notes (Signed)
Physical Therapy Session Note  Patient Details  Name: Edward [name] MRN: [id number] Date of Birth: 24-Apr-1971  Today's Date: 06/30/2018 PT Individual Time: 1030-1100 PT Individual Time Calculation (min): 30 min   Short Term Goals: Week 1:  PT Short Term Goal 1 (Week 1): Pt will perform functional transfers with supervision  PT Short Term Goal 2 (Week 1): Pt will ambulate 27' with RW and S PT Short Term Goal 3 (Week 1): Pt will initiate curb step training with LRAD   Skilled Therapeutic Interventions/Progress Updates:    Pt request to go outside today, focused on community w/c mobiity including on/off elevators, over door thresholds to outdoors, and longer distances for functional endurance and UE strengthening at modified independent level > 500'. Pt able to perform basic transfer OOB with squat pivot technique at modified independent level including w/c parts management. Sit <> stand with RW at supervision level for safety due to fall risk and performed gait x 67' as max distance before fatigue with close supervision and increased time. Pt transferred to toilet at end of session with supervision using RW for transfer and NT aware. Pt denies concerns with upcoming d/c, more worried about outside stressors. Emotional support provided during session.   Therapy Documentation Precautions:  Precautions Precautions: Fall Required Braces or Orthoses: Other Brace/Splint Other Brace/Splint: Limb guard/stump protector/positioning splint Restrictions Weight Bearing Restrictions: Yes RLE Weight Bearing: Non weight bearing  Pain: No complaints during session.   Therapy/Group: Individual Therapy  [name], PT, DPT, CBIS  06/30/2018, 12:08 PM

## 2018-06-30 NOTE — Progress Notes (Signed)
Pt. Complains of SOB, when assessed O2 sat is 100%, denied nay dizziness, FSBS 104, PA notified. No new order given, passed it on report, we continue to monitor.

## 2018-06-30 NOTE — Discharge Instructions (Signed)
Inpatient Rehab Discharge Instructions  Edward [name] Discharge date and time: No discharge date for patient encounter.   Activities/Precautions/ Functional Status: Activity: activity as tolerated Diet: diabetic diet Wound Care: keep wound clean and dry Functional status:  ___ No restrictions     ___ Walk up steps independently ___ 24/7 supervision/assistance   ___ Walk up steps with assistance ___ Intermittent supervision/assistance  ___ Bathe/dress independently ___ Walk with walker     _x__ Bathe/dress with assistance ___ Walk Independently    ___ Shower independently ___ Walk with assistance    ___ Shower with assistance ___ No alcohol     ___ Return to work/school ________  Special Instructions: No smoking    COMMUNITY REFERRALS UPON DISCHARGE:    Home Health:   PT & OT  Agency:[facility] [place]   Date of last service:07/01/2018  Medical Equipment/Items Ordered:AMPUTEE PAD-RIGHT & TUB BENCH  Agency/Supplier:[facility]   [phone]   GENERAL COMMUNITY RESOURCES FOR PATIENT/FAMILY: Support [place]   My questions have been answered and I understand these instructions. I will adhere to these goals and the provided educational materials after my discharge from the hospital.  Patient/Caregiver Signature _______________________________ Date __________  Clinician Signature _______________________________________ Date __________  Please bring this form and your medication list with you to all your follow-up doctor's appointments.

## 2018-07-01 LAB — GLUCOSE, CAPILLARY: Glucose-Capillary: 107 mg/dL — ABNORMAL HIGH (ref 70–99)

## 2018-07-01 MED ORDER — METOPROLOL TARTRATE 25 MG PO TABS: 25.0000 mg | ORAL_TABLET | Freq: Two times a day (BID) | ORAL | 1 refills | Status: DC

## 2018-07-01 MED ORDER — BLOOD GLUCOSE METER KIT: PACK | 0 refills | Status: DC

## 2018-07-01 MED ORDER — POLYETHYLENE GLYCOL 3350 17 G PO PACK: 17.0000 g | PACK | Freq: Every day | ORAL | 0 refills | Status: DC

## 2018-07-01 MED ORDER — INSULIN PEN NEEDLE 31G X 5 MM MISC: 2 refills | Status: DC

## 2018-07-01 MED ORDER — ACETAMINOPHEN 325 MG PO TABS: 325.0000 mg | ORAL_TABLET | Freq: Four times a day (QID) | ORAL | Status: DC | PRN

## 2018-07-01 MED ORDER — INSULIN ASPART PROT & ASPART (70-30 MIX) 100 UNIT/ML PEN: 15.0000 [IU] | PEN_INJECTOR | Freq: Two times a day (BID) | SUBCUTANEOUS | 11 refills | Status: DC

## 2018-07-01 MED ORDER — OXYCODONE HCL 5 MG PO TABS: 5.0000 mg | ORAL_TABLET | ORAL | 0 refills | Status: DC | PRN

## 2018-07-01 MED ORDER — SENNA 8.6 MG PO TABS: 1.0000 | ORAL_TABLET | Freq: Every day | ORAL | 0 refills | Status: DC

## 2018-07-01 MED ORDER — METHOCARBAMOL 500 MG PO TABS: 500.0000 mg | ORAL_TABLET | Freq: Four times a day (QID) | ORAL | 0 refills | Status: DC | PRN

## 2018-07-01 MED ORDER — ADULT MULTIVITAMIN W/MINERALS CH: 1.0000 | ORAL_TABLET | Freq: Every day | ORAL | Status: DC

## 2018-07-01 MED ORDER — SITAGLIPTIN PHOS-METFORMIN HCL 50-1000 MG PO TABS: 1.0000 | ORAL_TABLET | Freq: Two times a day (BID) | ORAL | 3 refills | Status: DC

## 2018-07-01 MED FILL — METHOCARBAMOL 500 MG TABS: 500 | 15 days supply | Qty: 60 | Fill #0

## 2018-07-01 MED FILL — oxyCODONE HCL 5 MG TABS: 5 | 3 days supply | Qty: 30 | Fill #0

## 2018-07-01 MED FILL — METOPROLOL TARTRATE 25 MG T: 25 | 30 days supply | Qty: 60 | Fill #0

## 2018-07-01 MED FILL — UNIFINE PENTIPS 31GX3/16": 31G X 5 MM | 30 days supply | Qty: 60 | Fill #0

## 2018-07-01 MED FILL — ACCU-CHEK FASTCLIX LANCETS: 26 days supply | Qty: 102 | Fill #0

## 2018-07-01 MED FILL — ACCU-CHEK AVIVA PLUS W/DEVI: W/DEVICE | 30 days supply | Qty: 1 | Fill #0

## 2018-07-01 MED FILL — UNIFINE PENTIPS 31GX3/16: 31G X 5 MM | 30 days supply | Qty: 60 | Fill #0

## 2018-07-01 MED FILL — ACCU-CHEK AVIVA PLUS TEST S: 25 days supply | Qty: 100 | Fill #0

## 2018-07-01 MED FILL — NOVOLOG MIX 70-30 FLEXPEN S: (70-30) 100 | 30 days supply | Qty: 9 | Fill #0

## 2018-07-01 NOTE — Progress Notes (Signed)
Patient discharged with family all belongings accounted for. No questions upon discharge.

## 2018-07-02 ENCOUNTER — Telehealth: Payer: Self-pay

## 2018-07-02 NOTE — Telephone Encounter (Signed)
Transitional Care call  Patient name: Edward [name]) DOB: (14-Sep-1970) 1. Are you/is patient experiencing any problems since coming home? (NO) a. Are there any questions regarding any aspect of care? (NA) 2. Are there any questions regarding medications administration/dosing? (NO) a. Are meds being taken as prescribed? (YES) b. "Patient should review meds with caller to confirm"  3. Have there been any falls? (NO) 4. Has Home Health been to the house and/or have they contacted you? (NO) a. If not, have you tried to contact them? (NA) b. Can we help you contact them? (NA) 5. Are bowels and bladder emptying properly? (YES) a. Are there any unexpected incontinence issues? (NA) b. If applicable, is patient following bowel/bladder programs? (NA) 6. Any fevers, problems with breathing, unexpected pain? (NO) 7. Are there any skin problems or new areas of breakdown? (NO) 8. Has the patient/family member arranged specialty MD follow up (ie cardiology/neurology/renal/surgical/etc.)?  (YES) a. Can we help arrange? (NA) 9. Does the patient need any other services or support that we can help arrange? (NO) 10. Are caregivers following through as expected in assisting the patient? (YES) 11. Has the patient quit smoking, drinking alcohol, or using drugs as recommended? (NA)  Appointment date/time (07-15-2018 / 12:20pm), arrive time (12:00pm) and who it is with here (Dr. Posey Pronto) [place]

## 2018-07-03 ENCOUNTER — Ambulatory Visit (INDEPENDENT_AMBULATORY_CARE_PROVIDER_SITE_OTHER): Payer: Medicaid Other | Admitting: Physician Assistant

## 2018-07-03 ENCOUNTER — Telehealth: Payer: Self-pay

## 2018-07-03 ENCOUNTER — Encounter (INDEPENDENT_AMBULATORY_CARE_PROVIDER_SITE_OTHER): Payer: Self-pay | Admitting: Physician Assistant

## 2018-07-03 VITALS — Ht 72.99 in | Wt 220.5 lb

## 2018-07-03 DIAGNOSIS — Z89432 Acquired absence of left foot: Secondary | ICD-10-CM

## 2018-07-03 DIAGNOSIS — Z89511 Acquired absence of right leg below knee: Secondary | ICD-10-CM

## 2018-07-03 DIAGNOSIS — N183 Chronic kidney disease, stage 3 unspecified: Secondary | ICD-10-CM

## 2018-07-03 DIAGNOSIS — E1142 Type 2 diabetes mellitus with diabetic polyneuropathy: Secondary | ICD-10-CM

## 2018-07-03 DIAGNOSIS — Z794 Long term (current) use of insulin: Secondary | ICD-10-CM

## 2018-07-03 NOTE — Progress Notes (Signed)
Office Visit Note   Patient: Edward [name]           Date of Birth: Sep 20, 1970           MRN: [id number] Visit Date: 07/03/2018              Requested by: [name], [address] [place], [place] [address] PCP: [name], MD  Chief Complaint  Patient presents with  . Right Leg - Routine Post Op    Right leg BKA post/op      HPI: The patient is a 47 year old male with type 2 diabetes with insensate polyneuropathy who is seen for postoperative follow-up following a right below the knee amputation on 06/19/2018.  He previously had a left transmetatarsal amputation in 2017.  He went to inpatient rehabilitation postoperatively and was discharged on 07/01/2018. He is 14 days postop. The patient reports minimal pain.  His staples are intact.  He reports some swelling.  He is wearing his silver stump shrinker stocking.  He reports that the Praveena wound VAC was taken off during inpatient rehabilitation may be as much as a week ago.  He just discharged home a couple of days ago.     Assessment & Plan: Visit Diagnoses:  1. Acquired absence of right lower extremity below knee ([place])   2. S/P transmetatarsal amputation of foot, left ([place])   3. Type 2 diabetes mellitus with diabetic polyneuropathy, with long-term current use of insulin ([place])   4. CKD (chronic kidney disease), stage III ([place])     Plan: The patient appears to be healing well.  Will leave the staples in for at least another week.  He should continue his silver stump shrinker stocking.  He was given a prescription for biotech for a K3 prosthesis.  He has the ability to become a community ambulator and walk at varying speeds on a regular basis for activities such as shopping and attending community events and services.  He should be able to change speeds while walking and be able to walk on uneven surfaces such as grass gravel curbs ramps and bleachers as well as stairs.  He will follow-up in 1 week  Follow-Up  Instructions: Return in about 1 week (around 07/10/2018).   Ortho Exam  Patient is alert, oriented, no adenopathy, well-dressed, normal affect, normal respiratory effort. The right transtibial amputation site is healing well.  There are no signs of cellulitis or infection.  There is no drainage.  There is minimal edema of the distal residual limb.  Staples are intact.  Imaging: No results found. No images are attached to the encounter.  Labs: Lab Results  Component Value Date   HGBA1C 15.9 (H) 05/14/2018   HGBA1C 10.7 (H) 10/04/2016   HGBA1C >15.5 (H) 05/08/2016   ESRSEDRATE 128 (H) 06/15/2018   ESRSEDRATE 47 (H) 05/07/2016   CRP 215.2 (H) 06/15/2018   CRP 22.6 (H) 05/07/2016   REPTSTATUS 06/22/2018 FINAL 06/17/2018   GRAMSTAIN  06/17/2018    RARE WBC PRESENT, PREDOMINANTLY PMN FEW GRAM POSITIVE COCCI    CULT  06/17/2018    MODERATE VIRIDANS STREPTOCOCCUS FEW ENTEROCOCCUS FAECALIS MODERATE PREVOTELLA SPECIES BETA LACTAMASE POSITIVE    LABORGA ENTEROCOCCUS FAECALIS 06/17/2018     Lab Results  Component Value Date   ALBUMIN 2.1 (L) 06/23/2018   ALBUMIN 2.4 (L) 06/16/2018   ALBUMIN 2.6 (L) 05/15/2018    Body mass index is 29.09 kg/m.  Orders:  No orders of the defined types  were placed in this encounter.  No orders of the defined types were placed in this encounter.    Procedures: No procedures performed  Clinical Data: No additional findings.  ROS:  All other systems negative, except as noted in the HPI. Review of Systems  Objective: Vital Signs: Ht 6' 0.99" (1.854 m)   Wt 220 lb 7.4 oz (100 kg)   BMI 29.09 kg/m   Specialty Comments:  No specialty comments available.  PMFS History: Patient Active Problem List   Diagnosis Date Noted  . Hypoglycemia   . CKD (chronic kidney disease), stage III ([place])   . Anxiety about health   . Postoperative pain   . Labile blood glucose   . Essential hypertension   . History of transmetatarsal amputation of  left foot ([place])   . Hypoalbuminemia due to protein-calorie malnutrition ([place])   . Stage 3 chronic kidney disease ([place])   . Poorly controlled type 2 diabetes mellitus with peripheral neuropathy ([place])   . Unilateral complete BKA, right, subsequent encounter ([place])   . Right below-knee amputee ([place]) 06/22/2018  . Acute blood loss anemia   . Post-operative pain   . Anemia of chronic disease   . Leukocytosis   . Unilateral complete BKA, right, initial encounter ([place])   . Tobacco abuse   . Hyperkalemia 06/20/2018  . Diabetic polyneuropathy associated with type 2 diabetes mellitus ([place])   . Severe protein-calorie malnutrition ([place])   . AKI (acute kidney injury) ([place]) 06/17/2018  . Normocytic anemia 06/17/2018  . Osteomyelitis of right foot ([place]) 05/14/2018  . DM2 (diabetes mellitus, type 2) ([place]) 05/14/2018  . Viral pharyngitis 01/14/2017  . Cigarette smoker 01/14/2017  . Elevated blood pressure reading 01/14/2017  . Avascular necrosis of hip, left ([place]) 10/11/2016  . Status post left hip replacement 10/11/2016  . Unilateral primary osteoarthritis, left hip 09/18/2016  . S/P transmetatarsal amputation of foot, left ([place]) 07/25/2016  . Balance problem 07/25/2016  . Sepsis ([place]) 05/07/2016  . Fournier's gangrene 08/16/2011  . Acute respiratory failure ([place]) 08/16/2011  . Septic shock(785.52) 08/16/2011  . Diabetic osteomyelitis ([place]) 08/16/2011   Past Medical History:  Diagnosis Date  . Anxiety    self reported  . Arthritis   . Depression    self reported  . Diabetes mellitus   . Hypertension     Family History  Problem Relation Age of Onset  . Diabetes Other     Past Surgical History:  Procedure Laterality Date  . AMPUTATION Left 05/10/2016   Procedure: Left Transmetatarsal Amputation;  Surgeon: [name], MD;  Location: [facility];  Service: Orthopedics;  Laterality: Left;  . AMPUTATION Right 05/16/2018   Procedure: PARTIAL RAY AMPUTATION FIFTH TOE RIGHT FOOT;  Surgeon:  [name], DPM;  Location: [facility];  Service: Podiatry;  Laterality: Right;  . AMPUTATION Right 06/17/2018   Procedure: AMPUTATION 4th RAY Right Foot;  Surgeon: Trula Slade, DPM;  Location: [facility];  Service: Podiatry;  Laterality: Right;  . AMPUTATION Right 06/19/2018   Procedure: RIGHT BELOW KNEE AMPUTATION;  Surgeon: [name], MD;  Location: [facility];  Service: Orthopedics;  Laterality: Right;  . APPLICATION OF WOUND VAC Right 06/19/2018   Procedure: APPLICATION OF WOUND VAC;  Surgeon: [name], MD;  Location: [facility];  Service: Orthopedics;  Laterality: Right;  . CIRCUMCISION  09/02/2011   Procedure: CIRCUMCISION ADULT;  Surgeon: Molli Hazard, MD;  Location: [facility];  Service: Urology;  Laterality: N/A;  .  I&D EXTREMITY  09/02/2011   Procedure: IRRIGATION AND DEBRIDEMENT EXTREMITY;  Surgeon: Theodoro Kos, DO;  Location: [facility];  Service: Plastics;  Laterality: N/A;  . INCISION AND DRAINAGE OF WOUND  08/12/2011   Procedure: IRRIGATION AND DEBRIDEMENT WOUND;  Surgeon: Zenovia Jarred, MD;  Location: [facility];  Service: General;;  perineum  . IRRIGATION AND DEBRIDEMENT ABSCESS  08/12/2011   Procedure: IRRIGATION AND DEBRIDEMENT ABSCESS;  Surgeon: Molli Hazard, MD;  Location: [facility];  Service: Urology;  Laterality: N/A;  irrigation and debridment perineum    . IRRIGATION AND DEBRIDEMENT ABSCESS  08/14/2011   Procedure: MINOR INCISION AND DRAINAGE OF ABSCESS;  Surgeon: Molli Hazard, MD;  Location: [facility];  Service: Urology;  Laterality: N/A;  Perineal Wound Debridement;Placement Bilateral Testicular Thigh Pouches  . TOTAL HIP ARTHROPLASTY Left 10/11/2016   Procedure: LEFT TOTAL HIP ARTHROPLASTY ANTERIOR APPROACH;  Surgeon: Mcarthur Rossetti, MD;  [facility];  Service: Orthopedics;  Laterality: Left;   Social History   Occupational History  . Not on file  Tobacco Use  . Smoking status: Current Every Day Smoker     Types: Cigarettes  . Smokeless tobacco: Never Used  Substance and Sexual Activity  . Alcohol use: Yes    Alcohol/week: 0.0 standard drinks    Comment: has not had alcohol in a month  . Drug use: No  . Sexual activity: Not on file

## 2018-07-03 NOTE — Telephone Encounter (Signed)
Geralynn called, requesting verbal orders for 1xwk X 1wk followed by 2xwk X 1wk followed by 1xwk X 2wks.  Called her back and approved verbal orders.

## 2018-07-09 ENCOUNTER — Ambulatory Visit (INDEPENDENT_AMBULATORY_CARE_PROVIDER_SITE_OTHER): Payer: Medicaid Other | Admitting: Physician Assistant

## 2018-07-09 ENCOUNTER — Encounter (INDEPENDENT_AMBULATORY_CARE_PROVIDER_SITE_OTHER): Payer: Self-pay | Admitting: Physician Assistant

## 2018-07-09 VITALS — Ht 72.0 in | Wt 220.0 lb

## 2018-07-09 DIAGNOSIS — E1142 Type 2 diabetes mellitus with diabetic polyneuropathy: Secondary | ICD-10-CM

## 2018-07-09 DIAGNOSIS — N183 Chronic kidney disease, stage 3 unspecified: Secondary | ICD-10-CM

## 2018-07-09 DIAGNOSIS — Z794 Long term (current) use of insulin: Secondary | ICD-10-CM

## 2018-07-09 DIAGNOSIS — Z89511 Acquired absence of right leg below knee: Secondary | ICD-10-CM

## 2018-07-09 DIAGNOSIS — Z89432 Acquired absence of left foot: Secondary | ICD-10-CM

## 2018-07-10 ENCOUNTER — Ambulatory Visit (INDEPENDENT_AMBULATORY_CARE_PROVIDER_SITE_OTHER): Payer: Medicaid Other | Admitting: Physician Assistant

## 2018-07-10 ENCOUNTER — Encounter (INDEPENDENT_AMBULATORY_CARE_PROVIDER_SITE_OTHER): Payer: Self-pay | Admitting: Physician Assistant

## 2018-07-10 DIAGNOSIS — Z89511 Acquired absence of right leg below knee: Secondary | ICD-10-CM

## 2018-07-10 NOTE — Progress Notes (Signed)
Patient was seen for nurse visit to remove a retained staple.

## 2018-07-10 NOTE — Progress Notes (Signed)
Office Visit Note   Patient: Edward [name]           Date of Birth: Aug 30, 1970           MRN: [id number] Visit Date: 07/09/2018              Requested by: [name], [address] [place], [place] [address] PCP: [name], MD  Chief Complaint  Patient presents with  . Right Leg - Routine Post Op    06/19/18 right BKA preveena restor vac. [employer] prosthetic.       HPI: The patient is a 47 year old male who is seen for postoperative follow-up following a right transtibial amputation on 06/19/2018.  He reports he has been doing well.  He is working with biotech for prosthesis and does have a vive stump shrinker stocking.  He also has a new area of concern over his previous left transmetatarsal amputation over the medial foot and is noted a hypopigmented area.  He reports there was a little bit of breakdown over the area couple of weeks ago.  He does wear a regular shoe on the left.  Assessment & Plan: Visit Diagnoses:  1. Acquired absence of right lower extremity below knee ([place])   2. S/P transmetatarsal amputation of foot, left ([place])   3. Type 2 diabetes mellitus with diabetic polyneuropathy, with long-term current use of insulin ([place])   4. CKD (chronic kidney disease), stage III ([place])     Plan: Patient's staples were removed this visit.  He should continue wearing his stump shrinker stockings as instructed.  He was given a prescription for biotech for his prosthesis at last visit.  He will follow-up here in 2 weeks or sooner should he have difficulties in the interim.  He was given a silicone dressing to protect the area over his left foot and told to monitor the area closely and follow-up sooner should he have any further breakdown  Follow-Up Instructions: Return in about 2 weeks (around 07/23/2018).   Ortho Exam  Patient is alert, oriented, no adenopathy, well-dressed, normal affect, normal respiratory effort. The right below the knee amputation is healing well and  sutures were harvested this visit.  There is no signs of cellulitis or infection.  There is moderate edema residually.  There is scant serosanguineous drainage.  Examination of left he has full knee extension and good flexion.  The left transmetatarsal amputation site is well-healed.  Over the medial foot he does have a small 1 cm by half a centimeter hypopigmented area where he likely had some previous trauma or blistering.  There is no current breakdown.  There is no signs of cellulitis or infection.  He has good pedal pulse.  This was covered with a silicone dressing and patient was instructed to keep a close eye on the area and follow-up sooner if he has any further difficulty.   Imaging: No results found.   Labs: Lab Results  Component Value Date   HGBA1C 15.9 (H) 05/14/2018   HGBA1C 10.7 (H) 10/04/2016   HGBA1C >15.5 (H) 05/08/2016   ESRSEDRATE 128 (H) 06/15/2018   ESRSEDRATE 47 (H) 05/07/2016   CRP 215.2 (H) 06/15/2018   CRP 22.6 (H) 05/07/2016   REPTSTATUS 06/22/2018 FINAL 06/17/2018   GRAMSTAIN  06/17/2018    RARE WBC PRESENT, PREDOMINANTLY PMN FEW GRAM POSITIVE COCCI    CULT  06/17/2018    MODERATE VIRIDANS STREPTOCOCCUS FEW ENTEROCOCCUS FAECALIS MODERATE PREVOTELLA SPECIES BETA LACTAMASE POSITIVE  LABORGA ENTEROCOCCUS FAECALIS 06/17/2018     Lab Results  Component Value Date   ALBUMIN 2.1 (L) 06/23/2018   ALBUMIN 2.4 (L) 06/16/2018   ALBUMIN 2.6 (L) 05/15/2018    Body mass index is 29.84 kg/m.  Orders:  No orders of the defined types were placed in this encounter.  No orders of the defined types were placed in this encounter.    Procedures: No procedures performed  Clinical Data: No additional findings.  ROS:  All other systems negative, except as noted in the HPI. Review of Systems  Objective: Vital Signs: Ht 6' (1.829 m)   Wt 220 lb (99.8 kg)   BMI 29.84 kg/m   Specialty Comments:  No specialty comments available.  PMFS  History: Patient Active Problem List   Diagnosis Date Noted  . Hypoglycemia   . CKD (chronic kidney disease), stage III ([place])   . Anxiety about health   . Postoperative pain   . Labile blood glucose   . Essential hypertension   . History of transmetatarsal amputation of left foot ([place])   . Hypoalbuminemia due to protein-calorie malnutrition ([place])   . Stage 3 chronic kidney disease ([place])   . Poorly controlled type 2 diabetes mellitus with peripheral neuropathy ([place])   . Unilateral complete BKA, right, subsequent encounter ([place])   . Right below-knee amputee ([place]) 06/22/2018  . Acute blood loss anemia   . Post-operative pain   . Anemia of chronic disease   . Leukocytosis   . Unilateral complete BKA, right, initial encounter ([place])   . Tobacco abuse   . Hyperkalemia 06/20/2018  . Diabetic polyneuropathy associated with type 2 diabetes mellitus ([place])   . Severe protein-calorie malnutrition ([place])   . AKI (acute kidney injury) ([place]) 06/17/2018  . Normocytic anemia 06/17/2018  . Osteomyelitis of right foot ([place]) 05/14/2018  . DM2 (diabetes mellitus, type 2) ([place]) 05/14/2018  . Viral pharyngitis 01/14/2017  . Cigarette smoker 01/14/2017  . Elevated blood pressure reading 01/14/2017  . Avascular necrosis of hip, left ([place]) 10/11/2016  . Status post left hip replacement 10/11/2016  . Unilateral primary osteoarthritis, left hip 09/18/2016  . S/P transmetatarsal amputation of foot, left ([place]) 07/25/2016  . Balance problem 07/25/2016  . Sepsis ([place]) 05/07/2016  . Fournier's gangrene 08/16/2011  . Acute respiratory failure ([place]) 08/16/2011  . Septic shock(785.52) 08/16/2011  . Diabetic osteomyelitis ([place]) 08/16/2011   Past Medical History:  Diagnosis Date  . Anxiety    self reported  . Arthritis   . Depression    self reported  . Diabetes mellitus   . Hypertension     Family History  Problem Relation Age of Onset  . Diabetes Other     Past Surgical History:  Procedure  Laterality Date  . AMPUTATION Left 05/10/2016   Procedure: Left Transmetatarsal Amputation;  Surgeon: [name], MD;  Location: [facility];  Service: Orthopedics;  Laterality: Left;  . AMPUTATION Right 05/16/2018   Procedure: PARTIAL RAY AMPUTATION FIFTH TOE RIGHT FOOT;  Surgeon: [name], DPM;  Location: [facility];  Service: Podiatry;  Laterality: Right;  . AMPUTATION Right 06/17/2018   Procedure: AMPUTATION 4th RAY Right Foot;  Surgeon: Trula Slade, DPM;  Location: [facility];  Service: Podiatry;  Laterality: Right;  . AMPUTATION Right 06/19/2018   Procedure: RIGHT BELOW KNEE AMPUTATION;  Surgeon: [name], MD;  Location: [facility];  Service: Orthopedics;  Laterality: Right;  . APPLICATION OF WOUND VAC Right 06/19/2018   Procedure: APPLICATION  OF WOUND VAC;  Surgeon: [name], MD;  Location: [facility];  Service: Orthopedics;  Laterality: Right;  . CIRCUMCISION  09/02/2011   Procedure: CIRCUMCISION ADULT;  Surgeon: Molli Hazard, MD;  Location: [facility];  Service: Urology;  Laterality: N/A;  . I&D EXTREMITY  09/02/2011   Procedure: IRRIGATION AND DEBRIDEMENT EXTREMITY;  Surgeon: Theodoro Kos, DO;  Location: [facility];  Service: Plastics;  Laterality: N/A;  . INCISION AND DRAINAGE OF WOUND  08/12/2011   Procedure: IRRIGATION AND DEBRIDEMENT WOUND;  Surgeon: Zenovia Jarred, MD;  Location: [facility];  Service: General;;  perineum  . IRRIGATION AND DEBRIDEMENT ABSCESS  08/12/2011   Procedure: IRRIGATION AND DEBRIDEMENT ABSCESS;  Surgeon: Molli Hazard, MD;  Location: [facility];  Service: Urology;  Laterality: N/A;  irrigation and debridment perineum    . IRRIGATION AND DEBRIDEMENT ABSCESS  08/14/2011   Procedure: MINOR INCISION AND DRAINAGE OF ABSCESS;  Surgeon: Molli Hazard, MD;  Location: [facility];  Service: Urology;  Laterality: N/A;  Perineal Wound Debridement;Placement Bilateral Testicular Thigh Pouches  . TOTAL HIP ARTHROPLASTY Left 10/11/2016    Procedure: LEFT TOTAL HIP ARTHROPLASTY ANTERIOR APPROACH;  Surgeon: Mcarthur Rossetti, MD;  [facility];  Service: Orthopedics;  Laterality: Left;   Social History   Occupational History  . Not on file  Tobacco Use  . Smoking status: Current Every Day Smoker    Types: Cigarettes  . Smokeless tobacco: Never Used  Substance and Sexual Activity  . Alcohol use: Yes    Alcohol/week: 0.0 standard drinks    Comment: has not had alcohol in a month  . Drug use: No  . Sexual activity: Not on file

## 2018-07-13 ENCOUNTER — Telehealth: Payer: Self-pay | Admitting: *Deleted

## 2018-07-13 NOTE — Telephone Encounter (Signed)
Henderson Newcomer OT [facility] called for POC 1wk1, 2wk1, 1wk1.  Approval given.

## 2018-07-15 ENCOUNTER — Encounter: Payer: Self-pay | Admitting: Physical Medicine & Rehabilitation

## 2018-07-15 ENCOUNTER — Encounter: Payer: Medicaid Other | Attending: Physical Medicine & Rehabilitation | Admitting: Physical Medicine & Rehabilitation

## 2018-07-15 VITALS — BP 127/84 | HR 92 | Ht 73.0 in | Wt 217.0 lb

## 2018-07-15 DIAGNOSIS — E1142 Type 2 diabetes mellitus with diabetic polyneuropathy: Secondary | ICD-10-CM | POA: Insufficient documentation

## 2018-07-15 DIAGNOSIS — F17210 Nicotine dependence, cigarettes, uncomplicated: Secondary | ICD-10-CM | POA: Insufficient documentation

## 2018-07-15 DIAGNOSIS — I10 Essential (primary) hypertension: Secondary | ICD-10-CM | POA: Diagnosis not present

## 2018-07-15 DIAGNOSIS — E1165 Type 2 diabetes mellitus with hyperglycemia: Secondary | ICD-10-CM

## 2018-07-15 DIAGNOSIS — M792 Neuralgia and neuritis, unspecified: Secondary | ICD-10-CM

## 2018-07-15 DIAGNOSIS — R269 Unspecified abnormalities of gait and mobility: Secondary | ICD-10-CM | POA: Diagnosis not present

## 2018-07-15 DIAGNOSIS — Z89432 Acquired absence of left foot: Secondary | ICD-10-CM | POA: Diagnosis not present

## 2018-07-15 DIAGNOSIS — Z89511 Acquired absence of right leg below knee: Secondary | ICD-10-CM | POA: Insufficient documentation

## 2018-07-15 MED ORDER — GABAPENTIN 100 MG PO CAPS: 100.0000 mg | ORAL_CAPSULE | Freq: Every day | ORAL | 1 refills | Status: DC

## 2018-07-15 MED FILL — GABAPENTIN 100 MG CAPS: 100 | 30 days supply | Qty: 30 | Fill #0

## 2018-07-15 NOTE — Progress Notes (Signed)
Subjective:    Patient ID: Edward [name], male    DOB: [birth year], 47 y.o.   MRN: [id number]  HPI 47 year old right-handed male with history of diabetes mellitus, hypertension, tobacco abuse, left transmetatarsal amputation in 2017 presents for transitional care management after receiving CIR for right BKA.   DATE OF ADMISSION:  06/22/2018 DATE OF DISCHARGE:  07/01/2018   At discharge, he was instructed to follow up with Ortho, which he did.  He saw PCP. Pain is worse at night. He continues to have anxiety at times at night. His CBGs have been better controlled. BP is controlled.  Therapies: 2/week DME: shower bench Mobility: wheelchair at all times.  Pain Inventory Average Pain 6 Pain Right Now 5 My pain is sharp, stabbing, tingling and aching  In the last 24 hours, has pain interfered with the following? General activity 6 Relation with others 3 Enjoyment of life 8 What TIME of day is your pain at its worst? night Sleep (in general) Poor  Pain is worse with: sitting and unsure Pain improves with: medication Relief from Meds: .  Mobility use a walker ability to climb steps?  no do you drive?  no use a wheelchair transfers alone  Function disabled: date disabled . I need assistance with the following:  meal prep and household duties  Neuro/Psych bowel control problems weakness numbness tingling trouble walking dizziness confusion depression anxiety loss of taste or smell  Prior Studies Any changes since last visit?  no  Physicians involved in your care Any changes since last visit?  no   Family History  Problem Relation Age of Onset  . Diabetes Other    Social History   Socioeconomic History  . Marital status: Widowed    Spouse name: Not on file  . Number of children: Not on file  . Years of education: Not on file  . Highest education level: Not on file  Occupational History  . Not on file  Social Needs  . Financial resource strain: Not  on file  . Food insecurity:    Worry: Not on file    Inability: Not on file  . Transportation needs:    Medical: Not on file    Non-medical: Not on file  Tobacco Use  . Smoking status: Current Every Day Smoker    Types: Cigarettes  . Smokeless tobacco: Never Used  Substance and Sexual Activity  . Alcohol use: Yes    Alcohol/week: 0.0 standard drinks    Comment: has not had alcohol in a month  . Drug use: No  . Sexual activity: Not on file  Lifestyle  . Physical activity:    Days per week: Not on file    Minutes per session: Not on file  . Stress: Not on file  Relationships  . Social connections:    Talks on phone: Not on file    Gets together: Not on file    Attends religious service: Not on file    Active member of club or organization: Not on file    Attends meetings of clubs or organizations: Not on file    Relationship status: Not on file  Other Topics Concern  . Not on file  Social History Narrative  . Not on file   Past Surgical History:  Procedure Laterality Date  . AMPUTATION Left 05/10/2016   Procedure: Left Transmetatarsal Amputation;  Surgeon: [name], MD;  Location: [facility];  Service: Orthopedics;  Laterality: Left;  . AMPUTATION Right 05/16/2018  Procedure: PARTIAL RAY AMPUTATION FIFTH TOE RIGHT FOOT;  Surgeon: [name], DPM;  Location: [facility];  Service: Podiatry;  Laterality: Right;  . AMPUTATION Right 06/17/2018   Procedure: AMPUTATION 4th RAY Right Foot;  Surgeon: Trula Slade, DPM;  Location: [facility];  Service: Podiatry;  Laterality: Right;  . AMPUTATION Right 06/19/2018   Procedure: RIGHT BELOW KNEE AMPUTATION;  Surgeon: [name], MD;  Location: [facility];  Service: Orthopedics;  Laterality: Right;  . APPLICATION OF WOUND VAC Right 06/19/2018   Procedure: APPLICATION OF WOUND VAC;  Surgeon: [name], MD;  Location: [facility];  Service: Orthopedics;  Laterality: Right;  . CIRCUMCISION  09/02/2011   Procedure: CIRCUMCISION ADULT;  Surgeon:  Molli Hazard, MD;  Location: [facility];  Service: Urology;  Laterality: N/A;  . I&D EXTREMITY  09/02/2011   Procedure: IRRIGATION AND DEBRIDEMENT EXTREMITY;  Surgeon: Theodoro Kos, DO;  Location: [facility];  Service: Plastics;  Laterality: N/A;  . INCISION AND DRAINAGE OF WOUND  08/12/2011   Procedure: IRRIGATION AND DEBRIDEMENT WOUND;  Surgeon: Zenovia Jarred, MD;  Location: [facility];  Service: General;;  perineum  . IRRIGATION AND DEBRIDEMENT ABSCESS  08/12/2011   Procedure: IRRIGATION AND DEBRIDEMENT ABSCESS;  Surgeon: Molli Hazard, MD;  Location: [facility];  Service: Urology;  Laterality: N/A;  irrigation and debridment perineum    . IRRIGATION AND DEBRIDEMENT ABSCESS  08/14/2011   Procedure: MINOR INCISION AND DRAINAGE OF ABSCESS;  Surgeon: Molli Hazard, MD;  Location: [facility];  Service: Urology;  Laterality: N/A;  Perineal Wound Debridement;Placement Bilateral Testicular Thigh Pouches  . TOTAL HIP ARTHROPLASTY Left 10/11/2016   Procedure: LEFT TOTAL HIP ARTHROPLASTY ANTERIOR APPROACH;  Surgeon: Mcarthur Rossetti, MD;  [facility];  Service: Orthopedics;  Laterality: Left;   Past Medical History:  Diagnosis Date  . Anxiety    self reported  . Arthritis   . Depression    self reported  . Diabetes mellitus   . Hypertension    BP 127/84   Pulse 92   Ht 6\' 1"  (1.854 m)   Wt 217 lb (98.4 kg)   SpO2 96%   BMI 28.63 kg/m   Opioid Risk Score:   Fall Risk Score:  `1  Depression screen PHQ 2/9  Depression screen PHQ 2/9 07/15/2018  Decreased Interest 3  Down, Depressed, Hopeless 3  PHQ - 2 Score 6  Altered sleeping 3  Tired, decreased energy 3  Change in appetite 2  Feeling bad or failure about yourself  1  Trouble concentrating 2  Moving slowly or fidgety/restless 1  Suicidal thoughts 0  PHQ-9 Score 18  Difficult doing work/chores Very difficult     Review of Systems  Constitutional: Positive for appetite  change.  HENT: Negative.   Eyes: Negative.   Respiratory: Positive for shortness of breath and wheezing.   Cardiovascular: Negative.   Gastrointestinal: Positive for abdominal pain, constipation, diarrhea, nausea and vomiting.  Endocrine: Negative.   Genitourinary: Negative.   Musculoskeletal: Positive for arthralgias, gait problem, joint swelling and myalgias.  Skin: Negative.   Allergic/Immunologic: Negative.   Neurological: Positive for dizziness and numbness.  Hematological: Negative.   Psychiatric/Behavioral: Positive for confusion and dysphoric mood. The patient is nervous/anxious.   All other systems reviewed and are negative.      Objective:   Physical Exam Constitutional: Well-developed and well nourished. NAD. HENT: Normocephalic and atraumatic.  Eyes: EOMI. No discharge.  Cardiovascular: RRR.  No JVD. Respiratory: Effort normal and breathsounds normal.  GI: Bowel sounds are normal. He exhibits no distension.  Musculoskeletal: Right BKA with tenderness, improving Left transmet amputation, unchanged Neurological: He is alert and oriented.  Motor: B/l UE 5/5 proximal to distal LLE: 5/5 proximal to distal, stable RLE: HF 5/5 proximal to distal, stable Skin: Left transmetatarsal amputation site healed.  Right BKA healing Psychiatric: He has a normal mood and affect. His behavior is normal. Thought content normal.     Assessment & Plan:  47 year old right-handed male with history of diabetes mellitus, hypertension, tobacco abuse, left transmetatarsal amputation in 2017 presents for transitional care management after receiving CIR for right BKA.   1. Decreased functional mobility secondary to right below-knee amputation 06/19/2018 as well as history of left transmetatarsal amputation 05/10/2016  Cont therapies  Cont follow up with Ortho  2. Pain Management: Neuropathic  Gabapentin 100 qhs ordered   3.  Diabetes mellitus with peripheral neuropathy.    Cont meds  CBGs  controlled  4.  Hypertension.    Cont meds  Controlled at present  5. Gait abnormality  Cont therapies  Cont wheelchair for safety  Meds reviewed Referrals reviewed All questions answered

## 2018-07-23 ENCOUNTER — Ambulatory Visit (INDEPENDENT_AMBULATORY_CARE_PROVIDER_SITE_OTHER): Payer: Medicaid Other | Admitting: Physician Assistant

## 2018-07-23 ENCOUNTER — Encounter (INDEPENDENT_AMBULATORY_CARE_PROVIDER_SITE_OTHER): Payer: Self-pay | Admitting: Physician Assistant

## 2018-07-23 VITALS — Ht 73.0 in | Wt 217.0 lb

## 2018-07-23 DIAGNOSIS — Z89511 Acquired absence of right leg below knee: Secondary | ICD-10-CM

## 2018-07-23 DIAGNOSIS — N183 Chronic kidney disease, stage 3 unspecified: Secondary | ICD-10-CM

## 2018-07-23 DIAGNOSIS — Z89432 Acquired absence of left foot: Secondary | ICD-10-CM

## 2018-07-23 DIAGNOSIS — Z794 Long term (current) use of insulin: Secondary | ICD-10-CM

## 2018-07-23 DIAGNOSIS — E1142 Type 2 diabetes mellitus with diabetic polyneuropathy: Secondary | ICD-10-CM

## 2018-07-23 NOTE — Progress Notes (Signed)
Office Visit Note   Patient: Edward [name]           Date of Birth: [birth year]           MRN: [id number] Visit Date: 07/23/2018              Requested by: [name], [address] [place], [place] [address] PCP: [name], MD  Chief Complaint  Patient presents with  . Right Leg - Routine Post Op    BKA      HPI: The patient is a 47 year old gentleman who is seen for postoperative follow-up following a right transtibial amputation on 06/19/2018.  He reports he continues to do well.  He is working with [employer] and is going to call them to get set up to begin working on getting a prosthesis.  He reports he is having a lot of phantom pain in the right lower extremity especially at night.  He is only been taking 100 mg of gabapentin at bedtime and we discussed increasing the use to 300 mg at bedtime.  He is having no other difficulties.  He is wearing his stump shrinker stocking except for hygiene and is also continuing to wear his stump protector.  Assessment & Plan: Visit Diagnoses:  1. Acquired absence of right lower extremity below knee ([place])   2. S/P transmetatarsal amputation of foot, left ([place])   3. Type 2 diabetes mellitus with diabetic polyneuropathy, with long-term current use of insulin ([place])   4. CKD (chronic kidney disease), stage III ([place])     Plan: He will follow-up with biotech for his prosthesis and orders were written for this last visit.  Continue stump shrinker stocking except for hygiene.  Recommended increasing gabapentin to 300 mg p.o. nightly for his phantom pain and we discussed that he could eventually increase this to 300 mg 3 times a day if his symptoms were not improved.  He will follow-up here in 6 weeks or sooner should he have difficulties in the interim.  Follow-Up Instructions: Return in about 6 weeks (around 09/03/2018).   Ortho Exam  Patient is alert, oriented, no adenopathy, well-dressed, normal affect, normal respiratory effort. The  right transtibial amputation site is well-healed and without signs of infection or cellulitis.  He has full knee extension and good flexion.  Instructed the patient in scar massage.  Imaging: No results found.   Labs: Lab Results  Component Value Date   HGBA1C 15.9 (H) 05/14/2018   HGBA1C 10.7 (H) 10/04/2016   HGBA1C >15.5 (H) 05/08/2016   ESRSEDRATE 128 (H) 06/15/2018   ESRSEDRATE 47 (H) 05/07/2016   CRP 215.2 (H) 06/15/2018   CRP 22.6 (H) 05/07/2016   REPTSTATUS 06/22/2018 FINAL 06/17/2018   GRAMSTAIN  06/17/2018    RARE WBC PRESENT, PREDOMINANTLY PMN FEW GRAM POSITIVE COCCI    CULT  06/17/2018    MODERATE VIRIDANS STREPTOCOCCUS FEW ENTEROCOCCUS FAECALIS MODERATE PREVOTELLA SPECIES BETA LACTAMASE POSITIVE    LABORGA ENTEROCOCCUS FAECALIS 06/17/2018     Lab Results  Component Value Date   ALBUMIN 2.1 (L) 06/23/2018   ALBUMIN 2.4 (L) 06/16/2018   ALBUMIN 2.6 (L) 05/15/2018    Body mass index is 28.63 kg/m.  Orders:  No orders of the defined types were placed in this encounter.  No orders of the defined types were placed in this encounter.    Procedures: No procedures performed  Clinical Data: No additional findings.  ROS:  All other systems negative, except as noted  in the HPI. Review of Systems  Objective: Vital Signs: Ht 6\' 1"  (1.854 m)   Wt 217 lb (98.4 kg)   BMI 28.63 kg/m   Specialty Comments:  No specialty comments available.  PMFS History: Patient Active Problem List   Diagnosis Date Noted  . S/P BKA (below knee amputation) unilateral, right ([place]) 07/15/2018  . Hypoglycemia   . CKD (chronic kidney disease), stage III ([place])   . Anxiety about health   . Postoperative pain   . Labile blood glucose   . Essential hypertension   . History of transmetatarsal amputation of left foot ([place])   . Hypoalbuminemia due to protein-calorie malnutrition ([place])   . Stage 3 chronic kidney disease ([place])   . Poorly controlled type 2 diabetes mellitus  with peripheral neuropathy ([place])   . Unilateral complete BKA, right, subsequent encounter ([place])   . Right below-knee amputee ([place]) 06/22/2018  . Acute blood loss anemia   . Post-operative pain   . Anemia of chronic disease   . Leukocytosis   . Unilateral complete BKA, right, initial encounter ([place])   . Tobacco abuse   . Hyperkalemia 06/20/2018  . Diabetic polyneuropathy associated with type 2 diabetes mellitus ([place])   . Severe protein-calorie malnutrition ([place])   . AKI (acute kidney injury) ([place]) 06/17/2018  . Normocytic anemia 06/17/2018  . Osteomyelitis of right foot ([place]) 05/14/2018  . DM2 (diabetes mellitus, type 2) ([place]) 05/14/2018  . Viral pharyngitis 01/14/2017  . Cigarette smoker 01/14/2017  . Elevated blood pressure reading 01/14/2017  . Avascular necrosis of hip, left ([place]) 10/11/2016  . Status post left hip replacement 10/11/2016  . Unilateral primary osteoarthritis, left hip 09/18/2016  . S/P transmetatarsal amputation of foot, left ([place]) 07/25/2016  . Balance problem 07/25/2016  . Sepsis ([place]) 05/07/2016  . Fournier's gangrene 08/16/2011  . Acute respiratory failure ([place]) 08/16/2011  . Septic shock(785.52) 08/16/2011  . Diabetic osteomyelitis ([place]) 08/16/2011   Past Medical History:  Diagnosis Date  . Anxiety    self reported  . Arthritis   . Depression    self reported  . Diabetes mellitus   . Hypertension     Family History  Problem Relation Age of Onset  . Diabetes Other     Past Surgical History:  Procedure Laterality Date  . AMPUTATION Left 05/10/2016   Procedure: Left Transmetatarsal Amputation;  Surgeon: [name], MD;  Location: [facility];  Service: Orthopedics;  Laterality: Left;  . AMPUTATION Right 05/16/2018   Procedure: PARTIAL RAY AMPUTATION FIFTH TOE RIGHT FOOT;  Surgeon: [name], DPM;  Location: [facility];  Service: Podiatry;  Laterality: Right;  . AMPUTATION Right 06/17/2018   Procedure: AMPUTATION 4th RAY Right Foot;  Surgeon: Trula Slade, DPM;  Location: [facility];  Service: Podiatry;  Laterality: Right;  . AMPUTATION Right 06/19/2018   Procedure: RIGHT BELOW KNEE AMPUTATION;  Surgeon: [name], MD;  Location: [facility];  Service: Orthopedics;  Laterality: Right;  . APPLICATION OF WOUND VAC Right 06/19/2018   Procedure: APPLICATION OF WOUND VAC;  Surgeon: [name], MD;  Location: [facility];  Service: Orthopedics;  Laterality: Right;  . CIRCUMCISION  09/02/2011   Procedure: CIRCUMCISION ADULT;  Surgeon: Molli Hazard, MD;  Location: [facility];  Service: Urology;  Laterality: N/A;  . I&D EXTREMITY  09/02/2011   Procedure: IRRIGATION AND DEBRIDEMENT EXTREMITY;  Surgeon: Theodoro Kos, DO;  Location: [facility];  Service: Plastics;  Laterality: N/A;  .  INCISION AND DRAINAGE OF WOUND  08/12/2011   Procedure: IRRIGATION AND DEBRIDEMENT WOUND;  Surgeon: Zenovia Jarred, MD;  Location: [facility];  Service: General;;  perineum  . IRRIGATION AND DEBRIDEMENT ABSCESS  08/12/2011   Procedure: IRRIGATION AND DEBRIDEMENT ABSCESS;  Surgeon: Molli Hazard, MD;  Location: [facility];  Service: Urology;  Laterality: N/A;  irrigation and debridment perineum    . IRRIGATION AND DEBRIDEMENT ABSCESS  08/14/2011   Procedure: MINOR INCISION AND DRAINAGE OF ABSCESS;  Surgeon: Molli Hazard, MD;  Location: [facility];  Service: Urology;  Laterality: N/A;  Perineal Wound Debridement;Placement Bilateral Testicular Thigh Pouches  . TOTAL HIP ARTHROPLASTY Left 10/11/2016   Procedure: LEFT TOTAL HIP ARTHROPLASTY ANTERIOR APPROACH;  Surgeon: Mcarthur Rossetti, MD;  Location[facility];  Service: Orthopedics;  Laterality: Left;   Social History   Occupational History  . Not on file  Tobacco Use  . Smoking status: Current Every Day Smoker    Types: Cigarettes  . Smokeless tobacco: Never Used  Substance and Sexual Activity  . Alcohol use: Yes    Alcohol/week: 0.0 standard drinks    Comment: has not had  alcohol in a month  . Drug use: No  . Sexual activity: Not on file

## 2018-07-24 ENCOUNTER — Encounter (INDEPENDENT_AMBULATORY_CARE_PROVIDER_SITE_OTHER): Payer: Self-pay | Admitting: Physician Assistant

## 2018-07-27 ENCOUNTER — Inpatient Hospital Stay (HOSPITAL_COMMUNITY)
Admission: EM | Admit: 2018-07-27 | Discharge: 2018-07-31 | DRG: 246 | Disposition: A | Discharge status: Home / Self Care | Payer: Medicaid Other | Attending: Interventional Cardiology | Admitting: Interventional Cardiology

## 2018-07-27 ENCOUNTER — Encounter (HOSPITAL_COMMUNITY): Payer: Self-pay | Admitting: Emergency Medicine

## 2018-07-27 ENCOUNTER — Emergency Department (HOSPITAL_COMMUNITY): Payer: Medicaid Other

## 2018-07-27 DIAGNOSIS — I129 Hypertensive chronic kidney disease with stage 1 through stage 4 chronic kidney disease, or unspecified chronic kidney disease: Secondary | ICD-10-CM | POA: Diagnosis present

## 2018-07-27 DIAGNOSIS — E1151 Type 2 diabetes mellitus with diabetic peripheral angiopathy without gangrene: Secondary | ICD-10-CM | POA: Diagnosis present

## 2018-07-27 DIAGNOSIS — Z794 Long term (current) use of insulin: Secondary | ICD-10-CM

## 2018-07-27 DIAGNOSIS — E1122 Type 2 diabetes mellitus with diabetic chronic kidney disease: Secondary | ICD-10-CM | POA: Diagnosis present

## 2018-07-27 DIAGNOSIS — I5041 Acute combined systolic (congestive) and diastolic (congestive) heart failure: Secondary | ICD-10-CM | POA: Diagnosis present

## 2018-07-27 DIAGNOSIS — J810 Acute pulmonary edema: Secondary | ICD-10-CM

## 2018-07-27 DIAGNOSIS — J9691 Respiratory failure, unspecified with hypoxia: Secondary | ICD-10-CM | POA: Diagnosis present

## 2018-07-27 DIAGNOSIS — E119 Type 2 diabetes mellitus without complications: Secondary | ICD-10-CM

## 2018-07-27 DIAGNOSIS — Z79899 Other long term (current) drug therapy: Secondary | ICD-10-CM

## 2018-07-27 DIAGNOSIS — N289 Disorder of kidney and ureter, unspecified: Secondary | ICD-10-CM

## 2018-07-27 DIAGNOSIS — E1142 Type 2 diabetes mellitus with diabetic polyneuropathy: Secondary | ICD-10-CM | POA: Diagnosis present

## 2018-07-27 DIAGNOSIS — I252 Old myocardial infarction: Secondary | ICD-10-CM | POA: Diagnosis present

## 2018-07-27 DIAGNOSIS — I161 Hypertensive emergency: Secondary | ICD-10-CM | POA: Diagnosis present

## 2018-07-27 DIAGNOSIS — N179 Acute kidney failure, unspecified: Secondary | ICD-10-CM | POA: Diagnosis present

## 2018-07-27 DIAGNOSIS — N183 Chronic kidney disease, stage 3 (moderate): Secondary | ICD-10-CM | POA: Diagnosis present

## 2018-07-27 DIAGNOSIS — F17210 Nicotine dependence, cigarettes, uncomplicated: Secondary | ICD-10-CM | POA: Diagnosis present

## 2018-07-27 DIAGNOSIS — Z89511 Acquired absence of right leg below knee: Secondary | ICD-10-CM

## 2018-07-27 DIAGNOSIS — Z79891 Long term (current) use of opiate analgesic: Secondary | ICD-10-CM

## 2018-07-27 DIAGNOSIS — Z833 Family history of diabetes mellitus: Secondary | ICD-10-CM

## 2018-07-27 DIAGNOSIS — I429 Cardiomyopathy, unspecified: Secondary | ICD-10-CM | POA: Diagnosis present

## 2018-07-27 DIAGNOSIS — I130 Hypertensive heart and chronic kidney disease with heart failure and stage 1 through stage 4 chronic kidney disease, or unspecified chronic kidney disease: Secondary | ICD-10-CM | POA: Diagnosis present

## 2018-07-27 DIAGNOSIS — I509 Heart failure, unspecified: Secondary | ICD-10-CM

## 2018-07-27 DIAGNOSIS — Z720 Tobacco use: Secondary | ICD-10-CM | POA: Diagnosis present

## 2018-07-27 DIAGNOSIS — J9601 Acute respiratory failure with hypoxia: Secondary | ICD-10-CM | POA: Diagnosis present

## 2018-07-27 DIAGNOSIS — I214 Non-ST elevation (NSTEMI) myocardial infarction: Principal | ICD-10-CM | POA: Diagnosis present

## 2018-07-27 HISTORY — DX: Atherosclerotic heart disease of native coronary artery without angina pectoris: I25.10

## 2018-07-27 MED ORDER — NITROGLYCERIN IN D5W 200-5 MCG/ML-% IV SOLN
INTRAVENOUS | Status: AC
  Filled 2018-07-27: qty 250

## 2018-07-27 MED ORDER — FUROSEMIDE 10 MG/ML IJ SOLN
80.0000 mg | Freq: Once | INTRAMUSCULAR | Status: AC
  Administered 2018-07-27: 80 mg via INTRAVENOUS
  Filled 2018-07-27: qty 8

## 2018-07-27 MED ORDER — NITROGLYCERIN IN D5W 200-5 MCG/ML-% IV SOLN
0.0000 ug/min | INTRAVENOUS | Status: DC
  Administered 2018-07-27: 5 ug/min via INTRAVENOUS
  Filled 2018-07-27 (×2): qty 250

## 2018-07-27 NOTE — ED Provider Notes (Signed)
[facility] EMERGENCY DEPARTMENT Provider Note   CSN: [id number] Arrival date & time: 07/27/18  2337     History   Chief Complaint Chief Complaint  Patient presents with  . Shortness of Breath  Level 5 caveat due to acuity of condition  HPI Edward [name] is a 47 y.o. male.  The history is provided by the patient and the EMS personnel. The history is limited by the condition of the patient.  Shortness of Breath  This is a new problem. The average episode lasts 2 days. The problem occurs frequently.The problem has been rapidly worsening. Pertinent negatives include no chest pain.  Patient presents with shortness of breath.  Over the past 48 hours it has been increasing orthopnea, and his shortness of breath became worse tonight. No chest pain reported.  EMS found the patient hypoxic, was placed on CPAP and given nitroglycerin No Other details known on arrival Past Medical History:  Diagnosis Date  . Anxiety    self reported  . Arthritis   . Depression    self reported  . Diabetes mellitus   . Hypertension     Patient Active Problem List   Diagnosis Date Noted  . S/P BKA (below knee amputation) unilateral, right ([place]) 07/15/2018  . Hypoglycemia   . CKD (chronic kidney disease), stage III ([place])   . Anxiety about health   . Postoperative pain   . Labile blood glucose   . Essential hypertension   . History of transmetatarsal amputation of left foot ([place])   . Hypoalbuminemia due to protein-calorie malnutrition ([place])   . Stage 3 chronic kidney disease ([place])   . Poorly controlled type 2 diabetes mellitus with peripheral neuropathy ([place])   . Unilateral complete BKA, right, subsequent encounter ([place])   . Right below-knee amputee ([place]) 06/22/2018  . Acute blood loss anemia   . Post-operative pain   . Anemia of chronic disease   . Leukocytosis   . Unilateral complete BKA, right, initial encounter ([place])   . Tobacco abuse   . Hyperkalemia 06/20/2018  .  Diabetic polyneuropathy associated with type 2 diabetes mellitus ([place])   . Severe protein-calorie malnutrition ([place])   . AKI (acute kidney injury) ([place]) 06/17/2018  . Normocytic anemia 06/17/2018  . Osteomyelitis of right foot ([place]) 05/14/2018  . DM2 (diabetes mellitus, type 2) ([place]) 05/14/2018  . Viral pharyngitis 01/14/2017  . Cigarette smoker 01/14/2017  . Elevated blood pressure reading 01/14/2017  . Avascular necrosis of hip, left ([place]) 10/11/2016  . Status post left hip replacement 10/11/2016  . Unilateral primary osteoarthritis, left hip 09/18/2016  . S/P transmetatarsal amputation of foot, left ([place]) 07/25/2016  . Balance problem 07/25/2016  . Sepsis ([place]) 05/07/2016  . Fournier's gangrene 08/16/2011  . Acute respiratory failure ([place]) 08/16/2011  . Septic shock(785.52) 08/16/2011  . Diabetic osteomyelitis ([place]) 08/16/2011    Past Surgical History:  Procedure Laterality Date  . AMPUTATION Left 05/10/2016   Procedure: Left Transmetatarsal Amputation;  Surgeon: [name], MD;  Location: [facility];  Service: Orthopedics;  Laterality: Left;  . AMPUTATION Right 05/16/2018   Procedure: PARTIAL RAY AMPUTATION FIFTH TOE RIGHT FOOT;  Surgeon: [name], DPM;  Location: [facility];  Service: Podiatry;  Laterality: Right;  . AMPUTATION Right 06/17/2018   Procedure: AMPUTATION 4th RAY Right Foot;  Surgeon: Trula Slade, DPM;  Location: [facility];  Service: Podiatry;  Laterality: Right;  . AMPUTATION Right 06/19/2018   Procedure: RIGHT BELOW KNEE AMPUTATION;  Surgeon: [name], MD;  Location: [facility];  Service: Orthopedics;  Laterality: Right;  . APPLICATION OF WOUND VAC Right 06/19/2018   Procedure: APPLICATION OF WOUND VAC;  Surgeon: [name], MD;  Location: [facility];  Service: Orthopedics;  Laterality: Right;  . CIRCUMCISION  09/02/2011   Procedure: CIRCUMCISION ADULT;  Surgeon: Molli Hazard, MD;  Location: [facility];  Service: Urology;  Laterality: N/A;    . I&D EXTREMITY  09/02/2011   Procedure: IRRIGATION AND DEBRIDEMENT EXTREMITY;  Surgeon: Theodoro Kos, DO;  Location: [facility];  Service: Plastics;  Laterality: N/A;  . INCISION AND DRAINAGE OF WOUND  08/12/2011   Procedure: IRRIGATION AND DEBRIDEMENT WOUND;  Surgeon: Zenovia Jarred, MD;  Location: [facility];  Service: General;;  perineum  . IRRIGATION AND DEBRIDEMENT ABSCESS  08/12/2011   Procedure: IRRIGATION AND DEBRIDEMENT ABSCESS;  Surgeon: Molli Hazard, MD;  Location: [facility];  Service: Urology;  Laterality: N/A;  irrigation and debridment perineum    . IRRIGATION AND DEBRIDEMENT ABSCESS  08/14/2011   Procedure: MINOR INCISION AND DRAINAGE OF ABSCESS;  Surgeon: Molli Hazard, MD;  Location: [facility];  Service: Urology;  Laterality: N/A;  Perineal Wound Debridement;Placement Bilateral Testicular Thigh Pouches  . TOTAL HIP ARTHROPLASTY Left 10/11/2016   Procedure: LEFT TOTAL HIP ARTHROPLASTY ANTERIOR APPROACH;  Surgeon: Mcarthur Rossetti, MD;  Location: [facility];  Service: Orthopedics;  Laterality: Left;        Home Medications    Prior to Admission medications   Medication Sig Start Date End Date Taking? Authorizing Provider  acetaminophen (TYLENOL) 325 MG tablet Take 1-2 tablets (325-650 mg total) by mouth every 6 (six) hours as needed for mild pain (pain score 1-3 or temp > 100.5). 07/01/18   Angiulli, Lavon Paganini, PA-C  blood glucose meter kit and supplies Dispense based on patient and insurance preference. Use up to four times daily as directed. (FOR ICD-9 250.00, 250.01). 07/01/18   Angiulli, Lavon Paganini, PA-C  gabapentin (NEURONTIN) 100 MG capsule Take 1 capsule (100 mg total) by mouth at bedtime. 07/15/18   [name], MD  insulin aspart protamine - aspart (NOVOLOG MIX 70/30 FLEXPEN) (70-30) 100 UNIT/ML FlexPen Inject 0.15 mLs (15 Units total) into the skin 2 (two) times daily with a meal. 07/01/18   Angiulli, Lavon Paganini, PA-C  Insulin Pen Needle 31G X  5 MM MISC Use daily to inject insulin as instructed 07/01/18   Angiulli, Lavon Paganini, PA-C  methocarbamol (ROBAXIN) 500 MG tablet Take 1 tablet (500 mg total) by mouth every 6 (six) hours as needed for muscle spasms. 07/01/18   Angiulli, Lavon Paganini, PA-C  metoprolol tartrate (LOPRESSOR) 25 MG tablet Take 1 tablet (25 mg total) by mouth 2 (two) times daily. 07/01/18   Angiulli, Lavon Paganini, PA-C  Multiple Vitamin (MULTIVITAMIN WITH MINERALS) TABS tablet Take 1 tablet by mouth daily. 07/01/18   Angiulli, Lavon Paganini, PA-C  oxyCODONE (OXY IR/ROXICODONE) 5 MG immediate release tablet Take 1-2 tablets (5-10 mg total) by mouth every 4 (four) hours as needed for moderate pain (pain score 4-6). 07/01/18   Angiulli, Lavon Paganini, PA-C  polyethylene glycol (MIRALAX / GLYCOLAX) packet Take 17 g by mouth daily. 07/01/18   Angiulli, Lavon Paganini, PA-C  senna (SENOKOT) 8.6 MG TABS tablet Take 1 tablet (8.6 mg total) by mouth at bedtime. 07/01/18   Angiulli, Lavon Paganini, PA-C  sitaGLIPtin-metformin (JANUMET) 50-1000 MG tablet Take 1 tablet by mouth 2 (two) times  daily with a meal. 07/01/18   Angiulli, Lavon Paganini, PA-C    Family History Family History  Problem Relation Age of Onset  . Diabetes Other     Social History Social History   Tobacco Use  . Smoking status: Current Every Day Smoker    Types: Cigarettes  . Smokeless tobacco: Never Used  Substance Use Topics  . Alcohol use: Yes    Alcohol/week: 0.0 standard drinks    Comment: has not had alcohol in a month  . Drug use: No     Allergies   Patient has no known allergies.   Review of Systems Review of Systems  Unable to perform ROS: Acuity of condition  Respiratory: Positive for shortness of breath.   Cardiovascular: Negative for chest pain.     Physical Exam Updated Vital Signs BP (!) 204/126 (BP Location: Right Arm)   Pulse (!) 108   Resp (!) 36   SpO2 100%   Physical Exam CONSTITUTIONAL: Well developed, distress noted HEAD:  Normocephalic/atraumatic EYES: EOMI/PERRL ENMT: Mucous membranes moist, CPAP in place NECK: supple no meningeal signs SPINE/BACK:entire spine nontender CV: S1/S2 noted, tachycardic LUNGS: Crackles bilaterally ABDOMEN: soft, nontender, no rebound or guarding, bowel sounds noted throughout abdomen GU:no cva tenderness NEURO: Pt is awake/alert/appropriate, moves all extremitiesx4.  No facial droop.   EXTREMITIES: pulses normal/equal, full ROM, right BKA noted, mild edema noted left lower extremity SKIN: warm, color normal PSYCH: unable to assess  ED Treatments / Results  Labs (all labs ordered are listed, but only abnormal results are displayed) Labs Reviewed  BASIC METABOLIC PANEL - Abnormal; Notable for the following components:      Result Value   CO2 18 (*)    Glucose, Bld 183 (*)    BUN 26 (*)    Creatinine, Ser 1.69 (*)    GFR calc non Af Amer 47 (*)    GFR calc Af Amer 54 (*)    All other components within normal limits  CBC WITH DIFFERENTIAL/PLATELET - Abnormal; Notable for the following components:   Hemoglobin 11.8 (*)    HCT 38.2 (*)    All other components within normal limits  TROPONIN I - Abnormal; Notable for the following components:   Troponin I 3.01 (*)    All other components within normal limits  BRAIN NATRIURETIC PEPTIDE - Abnormal; Notable for the following components:   B Natriuretic Peptide 436.7 (*)    All other components within normal limits  HEPARIN LEVEL (UNFRACTIONATED)    EKG EKG Interpretation  Date/Time:  Monday July 27 2018 23:43:10 EST Ventricular Rate:  109 PR Interval:    QRS Duration: 98 QT Interval:  330 QTC Calculation: 445 R Axis:   47 Text Interpretation:  Sinus tachycardia Low voltage, extremity leads Consider anterior infarct Confirmed by Ripley Fraise [phone]) on 07/27/2018 11:45:58 PM   EKG Interpretation  Date/Time:  Tuesday July 28 2018 01:36:45 EST Ventricular Rate:  103 PR Interval:    QRS  Duration: 82 QT Interval:  354 QTC Calculation: 464 R Axis:   23 Text Interpretation:  Sinus tachycardia Probable anterior infarct, old Confirmed by Ripley Fraise [phone]) on 07/28/2018 1:41:22 AM       Radiology Dg Chest Port 1 View  Result Date: 07/27/2018 CLINICAL DATA:  Shortness of breath EXAM: PORTABLE CHEST 1 VIEW COMPARISON:  05/07/2016 FINDINGS: Cardiomegaly with vascular congestion, pleural effusions and moderate pulmonary edema. Consolidations at the bases. No pneumothorax. IMPRESSION: 1. Cardiomegaly with vascular congestion and moderate pulmonary edema.  There are bilateral pleural effusions. 2. Dense airspace disease at the bases may reflect atelectasis or pneumonia Electronically Signed   By: Donavan Foil M.D.   On: 07/27/2018 23:58    Procedures Procedures  CRITICAL CARE Performed by: Sharyon Cable Total critical care time: 45 minutes Critical care time was exclusive of separately billable procedures and treating other patients. Critical care was necessary to treat or prevent imminent or life-threatening deterioration. Critical care was time spent personally by me on the following activities: development of treatment plan with patient and/or surrogate as well as nursing, discussions with consultants, evaluation of patient's response to treatment, examination of patient, obtaining history from patient or surrogate, ordering and performing treatments and interventions, ordering and review of laboratory studies, ordering and review of radiographic studies, pulse oximetry and re-evaluation of patient's condition.  Medications Ordered in ED Medications  nitroGLYCERIN 50 mg in dextrose 5 % 250 mL (0.2 mg/mL) infusion (110 mcg/min Intravenous Rate/Dose Change 07/28/18 0304)  heparin bolus via infusion 4,000 Units (has no administration in time range)  heparin ADULT infusion 100 units/mL (25000 units/256m sodium chloride 0.45%) (has no administration in time range)   isosorbide-hydrALAZINE (BIDIL) 20-37.5 MG per tablet 1 tablet (has no administration in time range)  furosemide (LASIX) injection 80 mg (80 mg Intravenous Given 07/27/18 2355)  aspirin chewable tablet 324 mg (324 mg Oral Given 07/28/18 0228)     Initial Impression / Assessment and Plan / ED Course  I have reviewed the triage vital signs and the nursing notes.  Pertinent labs & imaging results that were available during my care of the patient were reviewed by me and considered in my medical decision making (see chart for details).     11:58 PM Patient seen on arrival, likely has acute pulmonary edema.  Nitroglycerin/Lasix is been ordered.  He will need to be admitted patient has been transitioned to BiPAP 12:19 AM Patient is starting to improve.  Blood pressures improved. He will need to be admitted for CHF. 2:18 AM Discussed the case with the on-call cardiology fellow due to patient having troponin over 3.  He has seen the patient, and will agree to take him if we can get patient off BiPAP.  Patient does appear to be more comfortable.  Will attempt trial off BiPAP 3:05 AM Patient is tolerating off of noninvasive ventilation. D/w dr black-maier for admission  Final Clinical Impressions(s) / ED Diagnoses   Final diagnoses:  Acute pulmonary edema ([place]  Renal insufficiency  Non-STEMI (non-ST elevated myocardial infarction) ([facility]  Acute respiratory failure with hypoxia ([facility]    ED Discharge Orders    None       WRipley Fraise MD 07/28/18 [phone]

## 2018-07-27 NOTE — ED Triage Notes (Signed)
Pt has been getting progressively worse over past 48 hours, developed cough w/ non-productive cough, started taking motroperol over the past few weeks.  Right AKA three weeks ago.  Right lung sounds diminished, course rales and ronci on left side.  232/134.  79% on room air, given 1 nitro.

## 2018-07-28 ENCOUNTER — Encounter (HOSPITAL_COMMUNITY): Payer: Self-pay | Admitting: Physician Assistant

## 2018-07-28 ENCOUNTER — Inpatient Hospital Stay (HOSPITAL_COMMUNITY): Payer: Medicaid Other

## 2018-07-28 ENCOUNTER — Other Ambulatory Visit: Payer: Self-pay

## 2018-07-28 DIAGNOSIS — E1122 Type 2 diabetes mellitus with diabetic chronic kidney disease: Secondary | ICD-10-CM | POA: Diagnosis present

## 2018-07-28 DIAGNOSIS — I130 Hypertensive heart and chronic kidney disease with heart failure and stage 1 through stage 4 chronic kidney disease, or unspecified chronic kidney disease: Secondary | ICD-10-CM | POA: Diagnosis present

## 2018-07-28 DIAGNOSIS — E1151 Type 2 diabetes mellitus with diabetic peripheral angiopathy without gangrene: Secondary | ICD-10-CM | POA: Diagnosis present

## 2018-07-28 DIAGNOSIS — I161 Hypertensive emergency: Secondary | ICD-10-CM | POA: Diagnosis present

## 2018-07-28 DIAGNOSIS — I214 Non-ST elevation (NSTEMI) myocardial infarction: Secondary | ICD-10-CM | POA: Diagnosis present

## 2018-07-28 DIAGNOSIS — N183 Chronic kidney disease, stage 3 (moderate): Secondary | ICD-10-CM | POA: Diagnosis present

## 2018-07-28 DIAGNOSIS — Z89511 Acquired absence of right leg below knee: Secondary | ICD-10-CM | POA: Diagnosis not present

## 2018-07-28 DIAGNOSIS — N179 Acute kidney failure, unspecified: Secondary | ICD-10-CM | POA: Diagnosis present

## 2018-07-28 DIAGNOSIS — I10 Essential (primary) hypertension: Secondary | ICD-10-CM | POA: Diagnosis not present

## 2018-07-28 DIAGNOSIS — Z833 Family history of diabetes mellitus: Secondary | ICD-10-CM | POA: Diagnosis not present

## 2018-07-28 DIAGNOSIS — I252 Old myocardial infarction: Secondary | ICD-10-CM | POA: Diagnosis present

## 2018-07-28 DIAGNOSIS — Z79891 Long term (current) use of opiate analgesic: Secondary | ICD-10-CM | POA: Diagnosis not present

## 2018-07-28 DIAGNOSIS — I429 Cardiomyopathy, unspecified: Secondary | ICD-10-CM | POA: Diagnosis present

## 2018-07-28 DIAGNOSIS — J9601 Acute respiratory failure with hypoxia: Secondary | ICD-10-CM | POA: Diagnosis present

## 2018-07-28 DIAGNOSIS — Z79899 Other long term (current) drug therapy: Secondary | ICD-10-CM | POA: Diagnosis not present

## 2018-07-28 DIAGNOSIS — E1142 Type 2 diabetes mellitus with diabetic polyneuropathy: Secondary | ICD-10-CM | POA: Diagnosis present

## 2018-07-28 DIAGNOSIS — I351 Nonrheumatic aortic (valve) insufficiency: Secondary | ICD-10-CM | POA: Diagnosis not present

## 2018-07-28 DIAGNOSIS — I2510 Atherosclerotic heart disease of native coronary artery without angina pectoris: Secondary | ICD-10-CM | POA: Diagnosis not present

## 2018-07-28 DIAGNOSIS — J9691 Respiratory failure, unspecified with hypoxia: Secondary | ICD-10-CM | POA: Diagnosis present

## 2018-07-28 DIAGNOSIS — J810 Acute pulmonary edema: Secondary | ICD-10-CM | POA: Diagnosis present

## 2018-07-28 DIAGNOSIS — Z794 Long term (current) use of insulin: Secondary | ICD-10-CM | POA: Diagnosis not present

## 2018-07-28 DIAGNOSIS — I5041 Acute combined systolic (congestive) and diastolic (congestive) heart failure: Secondary | ICD-10-CM | POA: Diagnosis present

## 2018-07-28 DIAGNOSIS — F17210 Nicotine dependence, cigarettes, uncomplicated: Secondary | ICD-10-CM | POA: Diagnosis present

## 2018-07-28 DIAGNOSIS — I509 Heart failure, unspecified: Secondary | ICD-10-CM

## 2018-07-28 DIAGNOSIS — R0989 Other specified symptoms and signs involving the circulatory and respiratory systems: Secondary | ICD-10-CM | POA: Diagnosis not present

## 2018-07-28 LAB — LIPID PANEL
CHOL/HDL RATIO: 4.8 ratio
Cholesterol: 177 mg/dL (ref 0–200)
HDL: 37 mg/dL — ABNORMAL LOW (ref >40)
LDL Cholesterol: 129 mg/dL — ABNORMAL HIGH (ref 0–99)
Triglycerides: 53 mg/dL (ref <150)
VLDL: 11 mg/dL (ref 0–40)

## 2018-07-28 LAB — CBC WITH DIFFERENTIAL/PLATELET
ABS IMMATURE GRANULOCYTES: 0.03 10*3/uL (ref 0.00–0.07)
Basophils Absolute: 0.1 10*3/uL (ref 0.0–0.1)
Basophils Relative: 1 %
EOS PCT: 5 %
Eosinophils Absolute: 0.4 10*3/uL (ref 0.0–0.5)
HCT: 38.2 % — ABNORMAL LOW (ref 39.0–52.0)
Hemoglobin: 11.8 g/dL — ABNORMAL LOW (ref 13.0–17.0)
Immature Granulocytes: 0 %
Lymphocytes Relative: 23 %
Lymphs Abs: 1.9 10*3/uL (ref 0.7–4.0)
MCH: 26.6 pg (ref 26.0–34.0)
MCHC: 30.9 g/dL (ref 30.0–36.0)
MCV: 86 fL (ref 80.0–100.0)
MONO ABS: 0.7 10*3/uL (ref 0.1–1.0)
MONOS PCT: 8 %
Neutro Abs: 5.2 10*3/uL (ref 1.7–7.7)
Neutrophils Relative %: 63 %
PLATELETS: 297 10*3/uL (ref 150–400)
RBC: 4.44 MIL/uL (ref 4.22–5.81)
RDW: 14.6 % (ref 11.5–15.5)
WBC: 8.2 10*3/uL (ref 4.0–10.5)
nRBC: 0 % (ref 0.0–0.2)

## 2018-07-28 LAB — BASIC METABOLIC PANEL
Anion gap: 11 (ref 5–15)
Anion gap: 12 (ref 5–15)
BUN: 26 mg/dL — AB (ref 6–20)
BUN: 27 mg/dL — ABNORMAL HIGH (ref 6–20)
CALCIUM: 8.9 mg/dL (ref 8.9–10.3)
CO2: 18 mmol/L — ABNORMAL LOW (ref 22–32)
CO2: 19 mmol/L — ABNORMAL LOW (ref 22–32)
CREATININE: 1.69 mg/dL — AB (ref 0.61–1.24)
Calcium: 8.4 mg/dL — ABNORMAL LOW (ref 8.9–10.3)
Chloride: 107 mmol/L (ref 98–111)
Chloride: 107 mmol/L (ref 98–111)
Creatinine, Ser: 1.79 mg/dL — ABNORMAL HIGH (ref 0.61–1.24)
GFR calc Af Amer: 54 mL/min — ABNORMAL LOW (ref >60)
GFR calc non Af Amer: 44 mL/min — ABNORMAL LOW (ref >60)
GFR calc non Af Amer: 47 mL/min — ABNORMAL LOW (ref >60)
GFR, EST AFRICAN AMERICAN: 51 mL/min — AB (ref >60)
GLUCOSE: 183 mg/dL — AB (ref 70–99)
Glucose, Bld: 153 mg/dL — ABNORMAL HIGH (ref 70–99)
Potassium: 3.8 mmol/L (ref 3.5–5.1)
Potassium: 4.2 mmol/L (ref 3.5–5.1)
Sodium: 137 mmol/L (ref 135–145)
Sodium: 137 mmol/L (ref 135–145)

## 2018-07-28 LAB — HEPARIN LEVEL (UNFRACTIONATED)
Heparin Unfractionated: <0.1 IU/mL — ABNORMAL LOW (ref 0.30–0.70)
Heparin Unfractionated: <0.1 IU/mL — ABNORMAL LOW (ref 0.30–0.70)

## 2018-07-28 LAB — CBG MONITORING, ED
GLUCOSE-CAPILLARY: 123 mg/dL — AB (ref 70–99)
Glucose-Capillary: 109 mg/dL — ABNORMAL HIGH (ref 70–99)
Glucose-Capillary: 63 mg/dL — ABNORMAL LOW (ref 70–99)
Glucose-Capillary: 181 mg/dL — ABNORMAL HIGH (ref 70–99)

## 2018-07-28 LAB — GLUCOSE, CAPILLARY: Glucose-Capillary: 169 mg/dL — ABNORMAL HIGH (ref 70–99)

## 2018-07-28 LAB — ECHOCARDIOGRAM COMPLETE

## 2018-07-28 LAB — BRAIN NATRIURETIC PEPTIDE: B Natriuretic Peptide: 436.7 pg/mL — ABNORMAL HIGH (ref 0.0–100.0)

## 2018-07-28 LAB — TROPONIN I
TROPONIN I: 2.89 ng/mL — AB (ref <0.03)
TROPONIN I: 3.01 ng/mL — AB (ref <0.03)
Troponin I: 2.82 ng/mL (ref <0.03)
Troponin I: 2.41 ng/mL (ref <0.03)

## 2018-07-28 LAB — MAGNESIUM: Magnesium: 1.8 mg/dL (ref 1.7–2.4)

## 2018-07-28 LAB — TSH: TSH: 1.14 u[IU]/mL (ref 0.350–4.500)

## 2018-07-28 MED ORDER — ISOSORB DINITRATE-HYDRALAZINE 20-37.5 MG PO TABS: 1.0000 | ORAL_TABLET | Freq: Three times a day (TID) | ORAL | Status: DC

## 2018-07-28 MED ORDER — SODIUM CHLORIDE 0.9 % IV SOLN: 250.0000 mL | INTRAVENOUS | Status: DC | PRN

## 2018-07-28 MED ORDER — SODIUM CHLORIDE 0.9 % IV SOLN
INTRAVENOUS | Status: DC
  Administered 2018-07-29: 06:00:00 via INTRAVENOUS

## 2018-07-28 MED ORDER — INSULIN GLARGINE 100 UNIT/ML ~~LOC~~ SOLN
20.0000 [IU] | Freq: Every day | SUBCUTANEOUS | Status: DC
  Administered 2018-07-28 – 2018-07-31 (×4): 20 [IU] via SUBCUTANEOUS
  Filled 2018-07-28 (×4): qty 0.2

## 2018-07-28 MED ORDER — INSULIN ASPART 100 UNIT/ML ~~LOC~~ SOLN
6.0000 [IU] | Freq: Three times a day (TID) | SUBCUTANEOUS | Status: DC
  Administered 2018-07-30 – 2018-07-31 (×4): 6 [IU] via SUBCUTANEOUS

## 2018-07-28 MED ORDER — ONDANSETRON HCL 4 MG/2ML IJ SOLN: 4.0000 mg | Freq: Four times a day (QID) | INTRAMUSCULAR | Status: DC | PRN

## 2018-07-28 MED ORDER — TRAMADOL HCL 50 MG PO TABS
50.0000 mg | ORAL_TABLET | Freq: Four times a day (QID) | ORAL | Status: DC | PRN
  Administered 2018-07-28 – 2018-07-30 (×4): 50 mg via ORAL
  Filled 2018-07-28 (×4): qty 1

## 2018-07-28 MED ORDER — INSULIN ASPART 100 UNIT/ML ~~LOC~~ SOLN: 0.0000 [IU] | Freq: Every day | SUBCUTANEOUS | Status: DC

## 2018-07-28 MED ORDER — SODIUM CHLORIDE 0.9% FLUSH: 3.0000 mL | INTRAVENOUS | Status: DC | PRN

## 2018-07-28 MED ORDER — POLYETHYLENE GLYCOL 3350 17 G PO PACK
17.0000 g | PACK | Freq: Every day | ORAL | Status: DC
  Administered 2018-07-29 – 2018-07-31 (×3): 17 g via ORAL
  Filled 2018-07-28 (×2): qty 1

## 2018-07-28 MED ORDER — ASPIRIN EC 81 MG PO TBEC
81.0000 mg | DELAYED_RELEASE_TABLET | Freq: Every day | ORAL | Status: DC
  Administered 2018-07-28 – 2018-07-31 (×4): 81 mg via ORAL
  Filled 2018-07-28 (×4): qty 1

## 2018-07-28 MED ORDER — ASPIRIN 81 MG PO CHEW
81.0000 mg | CHEWABLE_TABLET | ORAL | Status: AC
  Administered 2018-07-29: 81 mg via ORAL
  Filled 2018-07-28: qty 1

## 2018-07-28 MED ORDER — METOPROLOL SUCCINATE ER 25 MG PO TB24
25.0000 mg | ORAL_TABLET | Freq: Every day | ORAL | Status: DC
  Administered 2018-07-29: 25 mg via ORAL
  Filled 2018-07-28 (×2): qty 1

## 2018-07-28 MED ORDER — FUROSEMIDE 10 MG/ML IJ SOLN
80.0000 mg | Freq: Once | INTRAMUSCULAR | Status: AC
  Administered 2018-07-28: 80 mg via INTRAVENOUS
  Filled 2018-07-28: qty 8

## 2018-07-28 MED ORDER — SODIUM CHLORIDE 0.9% FLUSH
3.0000 mL | Freq: Two times a day (BID) | INTRAVENOUS | Status: DC
  Administered 2018-07-28 – 2018-07-31 (×4): 3 mL via INTRAVENOUS

## 2018-07-28 MED ORDER — HYDRALAZINE HCL 50 MG PO TABS
25.0000 mg | ORAL_TABLET | Freq: Three times a day (TID) | ORAL | Status: DC
  Administered 2018-07-28 – 2018-07-30 (×5): 25 mg via ORAL
  Filled 2018-07-28 (×5): qty 1

## 2018-07-28 MED ORDER — HEPARIN BOLUS VIA INFUSION
4000.0000 [IU] | Freq: Once | INTRAVENOUS | Status: AC
  Administered 2018-07-28: 4000 [IU] via INTRAVENOUS
  Filled 2018-07-28: qty 4000

## 2018-07-28 MED ORDER — GABAPENTIN 100 MG PO CAPS
100.0000 mg | ORAL_CAPSULE | Freq: Every day | ORAL | Status: DC
  Administered 2018-07-28 – 2018-07-30 (×3): 100 mg via ORAL
  Filled 2018-07-28 (×3): qty 1

## 2018-07-28 MED ORDER — SODIUM CHLORIDE 0.9% FLUSH
3.0000 mL | Freq: Two times a day (BID) | INTRAVENOUS | Status: DC
  Administered 2018-07-28: 3 mL via INTRAVENOUS

## 2018-07-28 MED ORDER — ATORVASTATIN CALCIUM 80 MG PO TABS
80.0000 mg | ORAL_TABLET | Freq: Every day | ORAL | Status: DC
  Administered 2018-07-28 – 2018-07-30 (×2): 80 mg via ORAL
  Filled 2018-07-28 (×3): qty 1

## 2018-07-28 MED ORDER — ASPIRIN 81 MG PO CHEW
324.0000 mg | CHEWABLE_TABLET | Freq: Once | ORAL | Status: AC
  Administered 2018-07-28: 324 mg via ORAL
  Filled 2018-07-28: qty 4

## 2018-07-28 MED ORDER — HEPARIN BOLUS VIA INFUSION
3000.0000 [IU] | Freq: Once | INTRAVENOUS | Status: AC
  Administered 2018-07-28: 3000 [IU] via INTRAVENOUS
  Filled 2018-07-28: qty 3000

## 2018-07-28 MED ORDER — HEPARIN (PORCINE) 25000 UT/250ML-% IV SOLN
1950.0000 [IU]/h | INTRAVENOUS | Status: DC
  Administered 2018-07-28: 1200 [IU]/h via INTRAVENOUS
  Administered 2018-07-28: 1500 [IU]/h via INTRAVENOUS
  Filled 2018-07-28 (×3): qty 250

## 2018-07-28 MED ORDER — INSULIN ASPART 100 UNIT/ML ~~LOC~~ SOLN
0.0000 [IU] | Freq: Three times a day (TID) | SUBCUTANEOUS | Status: DC
  Administered 2018-07-28: 3 [IU] via SUBCUTANEOUS

## 2018-07-28 MED ORDER — INSULIN ASPART 100 UNIT/ML ~~LOC~~ SOLN
0.0000 [IU] | Freq: Three times a day (TID) | SUBCUTANEOUS | Status: DC
  Administered 2018-07-29: 2 [IU] via SUBCUTANEOUS
  Administered 2018-07-29 – 2018-07-30 (×2): 1 [IU] via SUBCUTANEOUS

## 2018-07-28 MED ORDER — SENNA 8.6 MG PO TABS
1.0000 | ORAL_TABLET | Freq: Every day | ORAL | Status: DC
  Administered 2018-07-28 – 2018-07-30 (×3): 8.6 mg via ORAL
  Filled 2018-07-28 (×4): qty 1

## 2018-07-28 MED ORDER — ISOSORB DINITRATE-HYDRALAZINE 20-37.5 MG PO TABS
1.0000 | ORAL_TABLET | Freq: Three times a day (TID) | ORAL | Status: DC
  Filled 2018-07-28: qty 1

## 2018-07-28 MED ORDER — ACETAMINOPHEN 325 MG PO TABS
650.0000 mg | ORAL_TABLET | ORAL | Status: DC | PRN
  Administered 2018-07-28: 650 mg via ORAL
  Filled 2018-07-28 (×2): qty 2

## 2018-07-28 NOTE — Progress Notes (Signed)
Inpatient Diabetes Program Recommendations  AACE/ADA: New Consensus Statement on Inpatient Glycemic Control (2015)  Target Ranges:  Prepandial:   less than 140 mg/dL      Peak postprandial:   less than 180 mg/dL (1-2 hours)      Critically ill patients:  140 - 180 mg/dL   Lab Results  Component Value Date   GLUCAP 109 (H) 07/28/2018   HGBA1C 15.9 (H) 05/14/2018    Review of Glycemic Control Results for Edward [name], Edward [name] (MRN [id number]) as of 07/28/2018 14:31  Ref. Range 07/01/2018 06:27 07/28/2018 10:06 07/28/2018 11:53  Glucose-Capillary Latest Ref Range: 70 - 99 mg/dL 107 (H) 123 (H) 109 (H)   Diabetes history: Type 2 DM Outpatient Diabetes medications: Novolog 70/30 15 units BID, Janumet 50-1000 mg QD Current orders for Inpatient glycemic control: Lantus 20 units QD, Novolog 6 units TID, Novolog 0-20 unit TID, Novolog 0-5 units QHS  Inpatient Diabetes Program Recommendations:    Noted last A1C, PA to repeat.   Consulted regarding current orders compared to home dose. Consider decreasing to Novolog 0-9 units TID, given previous hypoglycemic episode last hospitalization. Patient has been seen by DM coordinator last admission on 11/8. Will place consult and continue to follow once A1C is resulted.   Thanks, Bronson Curb, MSN, RNC-OB Diabetes Coordinator [phone] (8a-5p)

## 2018-07-28 NOTE — Progress Notes (Signed)
2D Echocardiogram has been performed.  07/28/2018, 11:42 AM

## 2018-07-28 NOTE — ED Notes (Addendum)
Pt refused breakfast tray.Pt stated that he has no appetite.

## 2018-07-28 NOTE — ED Notes (Signed)
Family bedside, updated on progress.

## 2018-07-28 NOTE — ED Notes (Signed)
Admitting provider bedside 

## 2018-07-28 NOTE — ED Notes (Signed)
Placed pt on 2 Lt O2   B/c pt O2 drops to 90%.

## 2018-07-28 NOTE — ED Notes (Signed)
Condom catheter placed on pt.

## 2018-07-28 NOTE — Progress Notes (Signed)
[facility] for Heparin  Indication: chest pain/ACS  No Known Allergies   Vital Signs: BP: 131/91 (12/10 1215) Pulse Rate: 96 (12/10 1215)  Labs: Recent Labs    07/27/18 2347 07/28/18 0019 07/28/18 0721 07/28/18 1139  HGB 11.8*  --   --   --   HCT 38.2*  --   --   --   PLT 297  --   --   --   HEPARINUNFRC  --   --   --  <0.10*  CREATININE 1.69*  --  1.79*  --   TROPONINI  --  3.01* 2.89* 2.82*    Estimated Creatinine Clearance: 62.3 mL/min (A) (by C-G formula based on SCr of 1.79 mg/dL (H)).  Assessment: 47 y/o M here with respiratory distress continues on IV heparin for elevated troponin. Initial heparin level is undetectable but running ok per RN assessment. No bleeding noted.    Goal of Therapy:  Heparin level 0.3-0.7 units/ml Monitor platelets by anticoagulation protocol: Yes   Plan:  Re-bolus heparin 3000 units Increase heparin gtt to 1500 units/hr Check a 6 hr heparin level Daily heparin level and CBC  Madelon Welsch, Rande Lawman 07/28/2018,12:37 PM

## 2018-07-28 NOTE — Progress Notes (Signed)
ANTICOAGULATION CONSULT NOTE - Initial Consult  Pharmacy Consult for Heparin  Indication: chest pain/ACS  No Known Allergies   Vital Signs: Temp: 97.8 F (36.6 C) (12/09 2355) Temp Source: Temporal (12/09 2355) BP: 150/110 (12/10 0221) Pulse Rate: 99 (12/10 0229)  Labs: Recent Labs    07/27/18 2347 07/28/18 0019  HGB 11.8*  --   HCT 38.2*  --   PLT 297  --   CREATININE 1.69*  --   TROPONINI  --  3.01*    Estimated Creatinine Clearance: 66 mL/min (A) (by C-G formula based on SCr of 1.69 mg/dL (H)).   Medical History: Past Medical History:  Diagnosis Date  . Anxiety    self reported  . Arthritis   . Depression    self reported  . Diabetes mellitus   . Hypertension     Assessment: 47 y/o M here with respiratory issues, found to have elevated troponin, starting heparin, PTA meds reviewed, Hgb 11.8, noted renal dysfunction.   Goal of Therapy:  Heparin level 0.3-0.7 units/ml Monitor platelets by anticoagulation protocol: Yes   Plan:  Heparin 4000 units BOLUS Start heparin drip at 1200 units/hr 1200 HL Daily CBC/HL Monitor for bleeding   Narda Bonds 07/28/2018,2:47 AM

## 2018-07-28 NOTE — ED Notes (Signed)
bfast tray ordered 

## 2018-07-28 NOTE — ED Notes (Signed)
Provider requested pt come off bipap to assess breathing

## 2018-07-28 NOTE — ED Notes (Signed)
Called main lab, will add on additional labs

## 2018-07-28 NOTE — Progress Notes (Addendum)
Progress Note  Patient Name: Edward [name] Date of Encounter: 07/28/2018  Primary Cardiologist: [place], will be Dr Stanford Breed   Subjective   Has mild chest pain (<1) at his mid-L lower rib edge, not tender and no change w/ deep inspiration. Started a short time ago, did not have last night.   Agrees he needs to quit tobacco  Inpatient Medications    Scheduled Meds: . aspirin EC  81 mg Oral Daily  . atorvastatin  80 mg Oral q1800  . gabapentin  100 mg Oral QHS  . insulin aspart  0-20 Units Subcutaneous TID WC  . insulin aspart  0-5 Units Subcutaneous QHS  . insulin aspart  6 Units Subcutaneous TID WC  . insulin glargine  20 Units Subcutaneous Daily  . isosorbide-hydrALAZINE  1 tablet Oral TID  . metoprolol succinate  25 mg Oral Daily  . sodium chloride flush  3 mL Intravenous Q12H   Continuous Infusions: . sodium chloride    . heparin 1,200 Units/hr (07/28/18 0403)  . nitroGLYCERIN 125 mcg/min (07/28/18 0710)   PRN Meds: sodium chloride, acetaminophen, ondansetron (ZOFRAN) IV, sodium chloride flush   Vital Signs    Vitals:   07/28/18 0830 07/28/18 0845 07/28/18 0900 07/28/18 0915  BP: 133/87 123/88 129/86 (!) 133/92  Pulse: 98 98 98 96  Resp: (!) 23 (!) 24 (!) 26 (!) 23  Temp:      TempSrc:      SpO2: 96% 96% 97% 96%    Intake/Output Summary (Last 24 hours) at 07/28/2018 0925 Last data filed at 07/28/2018 0222 Gross per 24 hour  Intake -  Output 600 ml  Net -600 ml    Telemetry    SR, ST - Personally Reviewed  ECG    10/10, SR, no change from previous - Personally Reviewed  Physical Exam   General: Well developed, well nourished, male appearing in no acute distress. Head: Normocephalic, atraumatic.  Neck: Supple without bruits, JVD approx 9 cm. Lungs:  Resp regular and unlabored, decreased BS bases w/ some rales Heart: RRR, S1, S2, no S3, S4, or murmur; no rub. Abdomen: Soft, non-tender, non-distended with normoactive bowel sounds. No hepatomegaly.  No rebound/guarding. No obvious abdominal masses. Extremities: No clubbing, cyanosis, no edema. Distal pedal pulses are 2+ bilaterally. S/p L BKA and R transmetatarsal amputations. R fem pulse ok, ?soft bruit. L fem pulse a little weaker, + bruit Neuro: Alert and oriented X 3. Moves all extremities spontaneously. Psych: Normal affect.  Labs    Hematology Recent Labs  Lab 07/27/18 2347  WBC 8.2  RBC 4.44  HGB 11.8*  HCT 38.2*  MCV 86.0  MCH 26.6  MCHC 30.9  RDW 14.6  PLT 297    Chemistry Recent Labs  Lab 07/27/18 2347 07/28/18 0721  NA 137 137  K 4.2 3.8  CL 107 107  CO2 18* 19*  GLUCOSE 183* 153*  BUN 26* 27*  CREATININE 1.69* 1.79*  CALCIUM 8.9 8.4*  GFRNONAA 47* 44*  GFRAA 54* 51*  ANIONGAP 12 11     Cardiac Enzymes Recent Labs  Lab 07/28/18 0019 07/28/18 0721  TROPONINI 3.01* 2.89*      BNP Recent Labs  Lab 07/28/18 0019  BNP 436.7*    Lab Results  Component Value Date   HGBA1C 15.9 (H) 05/14/2018    Radiology    Dg Chest Port 1 View  Result Date: 07/27/2018 CLINICAL DATA:  Shortness of breath EXAM: PORTABLE CHEST 1 VIEW COMPARISON:  05/07/2016  FINDINGS: Cardiomegaly with vascular congestion, pleural effusions and moderate pulmonary edema. Consolidations at the bases. No pneumothorax. IMPRESSION: 1. Cardiomegaly with vascular congestion and moderate pulmonary edema. There are bilateral pleural effusions. 2. Dense airspace disease at the bases may reflect atelectasis or pneumonia Electronically Signed   By: Donavan Foil M.D.   On: 07/27/2018 23:58    Cardiac Studies   ECHO:  ordered  Patient Profile     47 y.o. male w/ hx HTN, DM (poor control), CKD III, PAD s/p R- BKA, L transmetatarsal amputation, tob use, was admitted 12/10 early am w/ CP, SOB, pulm edema, and severe HTN.  Assessment & Plan    1. NSTEMI:  - Ez trending down, no typical chest pain - recent amputation, no cardiac problems w/ surgery or rehab - echo pending, spoke w/  echo and they will do next. - decide on further eval once it is reviewed.  2. Acute pulm edema - improved w/ IV Lasix 80 mg x 2 doses, last one 6:22 am today - no longer on BiPAP, now on nasal cannula - not sure he needs more IV rx, MD to review, may be able to change to po rx to minimize effect on kidneys in case cath needed.  - recheck CXR, 2 V  3. CKD III:  - Cr 1.2 05/18/2018 - Cr 1.7 06/15/2018 and has been between 1.5-1.86 since then - continue to follow  4. HTN - Per pt, BP not a problem till he had the amputation - has missed some meds, says will be more compliant in the future  5. DM:  - some blood sugars are low - will review w/ Diab coordinator, may need to adjust regimen  Otherwise, continue home rx. Active Problems:   DM2 (diabetes mellitus, type 2) ([facility])   Diabetic polyneuropathy associated with type 2 diabetes mellitus ([facility])   Tobacco abuse   Stage 3 chronic kidney disease ([place])   NSTEMI (non-ST elevated myocardial infarction) ([place])   Respiratory failure with hypoxia ([place])   Acute heart failure [facility])   Hypertensive emergency  Signed, Rosaria Ferries , PA-C 9:25 AM 07/28/2018 Pager: [phone] As above; pt seen and examined; admitted with CHF; no CP; troponin mildly elevated; check echo; diurese; treat with ASA, statin, lopressor, hydralazine and NTG. Will need cath once more stable. Kirk Ruths, MD

## 2018-07-28 NOTE — Progress Notes (Signed)
ANTICOAGULATION CONSULT NOTE  Pharmacy Consult for Heparin  Indication: chest pain/ACS  No Known Allergies   Vital Signs: Temp: 98.4 F (36.9 C) (12/10 2056) Temp Source: Oral (12/10 2056) BP: 127/82 (12/10 2100) Pulse Rate: 97 (12/10 2100)  Labs: Recent Labs    07/27/18 2347  07/28/18 0721 07/28/18 1139 07/28/18 1900 07/28/18 2105  HGB 11.8*  --   --   --   --   --   HCT 38.2*  --   --   --   --   --   PLT 297  --   --   --   --   --   HEPARINUNFRC  --   --   --  <0.10* <0.10*  --   CREATININE 1.69*  --  1.79*  --   --   --   TROPONINI  --    < > 2.89* 2.82*  --  2.41*   < > = values in this interval not displayed.    Estimated Creatinine Clearance: 61.9 mL/min (A) (by C-G formula based on SCr of 1.79 mg/dL (H)).  Assessment: 47 y/o M here with respiratory distress bump in trop peak at 3 now trending back down, EF 40% 12/10 - possible cath is Cr stable Currently on IV heparin drip 1500 uts/hr no problems with IV, HL remains undetectable < 0.1 No bleeding noted.    Goal of Therapy:  Heparin level 0.3-0.7 units/ml Monitor platelets by anticoagulation protocol: Yes   Plan:  Increase heparin gtt to 1800 units/hr Daily heparin level and CBC  Bonnita Nasuti Pharm.D. CPP, BCPS Clinical Pharmacist [phone] 07/28/2018 10:04 PM

## 2018-07-28 NOTE — H&P (Addendum)
Cardiology History & Physical    Patient ID: Edward [name] MRN: [id number], DOB: August 06, 1971 Date of Encounter: 07/28/2018, 2:18 AM Primary Physician: [name], MD Primary Cardiologist: No primary care provider on file. Primary Electrophysiologist:  None  Chief Complaint: Dyspnea Reason for Admission: Acute heart failure,  NSTEMI Requesting MD: Christy Gentles  HPI: Edward [name] is a 47 y.o. male with history of hypertension, chronic kidney disease, poorly controlled type 2 diabetes mellitus complicated by peripheral neuropathy, peripheral vascular disease status-post right BKA and left transmetatarsal amputation, tobacco use, who presents with worsening dyspnea for the past two days and found to have acute hypoxic respiratory failure and NSTEMI.  The patient reports he developed worsening dyspnea over the past 48 hours. Exacerbating factors include laying down flat, moving. He has been unable to sleep due to the shortness of breath. He has not had any chest discomfort. He has had a sensation of having fluid in his airway and has had a cough but no fevers or chills. He has noticed increasing lower extremity edema. No nausea vomiting arm pain or diaphoresis.   On presentation to the emergency room he was found to be hypoxic and placed on BIPAP. His blood pressure was 204/126 and he was placed on nitroglycerine infusion. Chest x-ray revealed bilateral pulmonary edema and he was given furosemide 80 mg IV with 600 cc urine output so far. The patient states that he is feeling better with these interventions.   Past Medical History:  Diagnosis Date  . Anxiety    self reported  . Arthritis   . Depression    self reported  . Diabetes mellitus   . Hypertension      Surgical History:  Past Surgical History:  Procedure Laterality Date  . AMPUTATION Left 05/10/2016   Procedure: Left Transmetatarsal Amputation;  Surgeon: [name], MD;  Location: [place];  Service: Orthopedics;  Laterality:  Left;  . AMPUTATION Right 05/16/2018   Procedure: PARTIAL RAY AMPUTATION FIFTH TOE RIGHT FOOT;  Surgeon: [name], DPM;  Location: [place];  Service: Podiatry;  Laterality: Right;  . AMPUTATION Right 06/17/2018   Procedure: AMPUTATION 4th RAY Right Foot;  Surgeon: Trula Slade, DPM;  Location: [employer] [facility];  Service: Podiatry;  Laterality: Right;  . AMPUTATION Right 06/19/2018   Procedure: RIGHT BELOW KNEE AMPUTATION;  Surgeon: [name], MD;  Location: [place];  Service: Orthopedics;  Laterality: Right;  . APPLICATION OF WOUND VAC Right 06/19/2018   Procedure: APPLICATION OF WOUND VAC;  Surgeon: [name], MD;  Location: [place];  Service: Orthopedics;  Laterality: Right;  . CIRCUMCISION  09/02/2011   Procedure: CIRCUMCISION ADULT;  Surgeon: Molli Hazard, MD;  Location: [place];  Service: Urology;  Laterality: N/A;  . I&D EXTREMITY  09/02/2011   Procedure: IRRIGATION AND DEBRIDEMENT EXTREMITY;  Surgeon: Theodoro Kos, DO;  Location: [place];  Service: Plastics;  Laterality: N/A;  . INCISION AND DRAINAGE OF WOUND  08/12/2011   Procedure: IRRIGATION AND DEBRIDEMENT WOUND;  Surgeon: Zenovia Jarred, MD;  Location: [place];  Service: General;;  perineum  . IRRIGATION AND DEBRIDEMENT ABSCESS  08/12/2011   Procedure: IRRIGATION AND DEBRIDEMENT ABSCESS;  Surgeon: Molli Hazard, MD;  Location: [place];  Service: Urology;  Laterality: N/A;  irrigation and debridment perineum    . IRRIGATION AND DEBRIDEMENT ABSCESS  08/14/2011   Procedure: MINOR INCISION AND DRAINAGE OF ABSCESS;  Surgeon: Molli Hazard, MD;  Location: [facility];  Service: Urology;  Laterality: N/A;  Perineal Wound Debridement;Placement Bilateral Testicular Thigh Pouches  . TOTAL HIP ARTHROPLASTY Left 10/11/2016   Procedure: LEFT TOTAL HIP ARTHROPLASTY ANTERIOR APPROACH;  Surgeon: Mcarthur Rossetti, MD;  Location: [facility];  Service: Orthopedics;  Laterality: Left;     Home  Meds: Prior to Admission medications   Medication Sig Start Date End Date Taking? Authorizing Provider  acetaminophen (TYLENOL) 325 MG tablet Take 1-2 tablets (325-650 mg total) by mouth every 6 (six) hours as needed for mild pain (pain score 1-3 or temp > 100.5). 07/01/18   Angiulli, Lavon Paganini, PA-C  blood glucose meter kit and supplies Dispense based on patient and insurance preference. Use up to four times daily as directed. (FOR ICD-9 250.00, 250.01). 07/01/18   Angiulli, Lavon Paganini, PA-C  gabapentin (NEURONTIN) 100 MG capsule Take 1 capsule (100 mg total) by mouth at bedtime. 07/15/18   [name], MD  insulin aspart protamine - aspart (NOVOLOG MIX 70/30 FLEXPEN) (70-30) 100 UNIT/ML FlexPen Inject 0.15 mLs (15 Units total) into the skin 2 (two) times daily with a meal. 07/01/18   Angiulli, Lavon Paganini, PA-C  Insulin Pen Needle 31G X 5 MM MISC Use daily to inject insulin as instructed 07/01/18   Angiulli, Lavon Paganini, PA-C  methocarbamol (ROBAXIN) 500 MG tablet Take 1 tablet (500 mg total) by mouth every 6 (six) hours as needed for muscle spasms. 07/01/18   Angiulli, Lavon Paganini, PA-C  metoprolol tartrate (LOPRESSOR) 25 MG tablet Take 1 tablet (25 mg total) by mouth 2 (two) times daily. 07/01/18   Angiulli, Lavon Paganini, PA-C  Multiple Vitamin (MULTIVITAMIN WITH MINERALS) TABS tablet Take 1 tablet by mouth daily. 07/01/18   Angiulli, Lavon Paganini, PA-C  oxyCODONE (OXY IR/ROXICODONE) 5 MG immediate release tablet Take 1-2 tablets (5-10 mg total) by mouth every 4 (four) hours as needed for moderate pain (pain score 4-6). 07/01/18   Angiulli, Lavon Paganini, PA-C  polyethylene glycol (MIRALAX / GLYCOLAX) packet Take 17 g by mouth daily. 07/01/18   Angiulli, Lavon Paganini, PA-C  senna (SENOKOT) 8.6 MG TABS tablet Take 1 tablet (8.6 mg total) by mouth at bedtime. 07/01/18   Angiulli, Lavon Paganini, PA-C  sitaGLIPtin-metformin (JANUMET) 50-1000 MG tablet Take 1 tablet by mouth 2 (two) times daily with a meal. 07/01/18   Angiulli,  Lavon Paganini, PA-C    Allergies: No Known Allergies  Social History   Socioeconomic History  . Marital status: Widowed    Spouse name: Not on file  . Number of children: Not on file  . Years of education: Not on file  . Highest education level: Not on file  Occupational History  . Not on file  Social Needs  . Financial resource strain: Not on file  . Food insecurity:    Worry: Not on file    Inability: Not on file  . Transportation needs:    Medical: Not on file    Non-medical: Not on file  Tobacco Use  . Smoking status: Current Every Day Smoker    Types: Cigarettes  . Smokeless tobacco: Never Used  Substance and Sexual Activity  . Alcohol use: Yes    Alcohol/week: 0.0 standard drinks    Comment: has not had alcohol in a month  . Drug use: No  . Sexual activity: Not on file  Lifestyle  . Physical activity:    Days per week: Not on file    Minutes per session: Not on file  .  Stress: Not on file  Relationships  . Social connections:    Talks on phone: Not on file    Gets together: Not on file    Attends religious service: Not on file    Active member of club or organization: Not on file    Attends meetings of clubs or organizations: Not on file    Relationship status: Not on file  . Intimate partner violence:    Fear of current or ex partner: Not on file    Emotionally abused: Not on file    Physically abused: Not on file    Forced sexual activity: Not on file  Other Topics Concern  . Not on file  Social History Narrative  . Not on file     Family History  Problem Relation Age of Onset  . Diabetes Other     Review of Systems: Review of Systems  Constitutional: Negative for chills, fever and weight loss.  HENT: Negative for congestion and sore throat.   Respiratory: Positive for cough, sputum production, shortness of breath and wheezing. Negative for hemoptysis and stridor.   Cardiovascular: Positive for orthopnea, leg swelling and PND. Negative for chest  pain, palpitations and claudication.  Gastrointestinal: Negative for abdominal pain, blood in stool, constipation, diarrhea, heartburn, melena, nausea and vomiting.  Genitourinary: Negative for dysuria, frequency and urgency.  Musculoskeletal: Negative for myalgias.  Neurological: Negative for sensory change, speech change and weakness.  Endo/Heme/Allergies: Does not bruise/bleed easily.     Labs:   Lab Results  Component Value Date   WBC 8.2 07/27/2018   HGB 11.8 (L) 07/27/2018   HCT 38.2 (L) 07/27/2018   MCV 86.0 07/27/2018   PLT 297 07/27/2018    Recent Labs  Lab 07/27/18 2347  NA 137  K 4.2  CL 107  CO2 18*  BUN 26*  CREATININE 1.69*  CALCIUM 8.9  GLUCOSE 183*   Recent Labs    07/28/18 0019  TROPONINI 3.01*   No results found for: CHOL, HDL, LDLCALC, TRIG No results found for: DDIMER  Radiology/Studies:  Dg Chest Port 1 View  Result Date: 07/27/2018 CLINICAL DATA:  Shortness of breath EXAM: PORTABLE CHEST 1 VIEW COMPARISON:  05/07/2016 FINDINGS: Cardiomegaly with vascular congestion, pleural effusions and moderate pulmonary edema. Consolidations at the bases. No pneumothorax. IMPRESSION: 1. Cardiomegaly with vascular congestion and moderate pulmonary edema. There are bilateral pleural effusions. 2. Dense airspace disease at the bases may reflect atelectasis or pneumonia Electronically Signed   By: Donavan Foil M.D.   On: 07/27/2018 23:58   Wt Readings from Last 3 Encounters:  07/23/18 98.4 kg  07/15/18 98.4 kg  07/09/18 99.8 kg    EKG: sinus tachycardia, low voltage in limb leads, absence of early precordial R waves   Physical Exam: Blood pressure (!) 149/111, pulse (!) 104, temperature 97.8 F (36.6 C), temperature source Temporal, resp. rate 17, SpO2 100 %. There is no height or weight on file to calculate BMI. General: Overweight AAM, uncomfortable, BIPAP mask Head: Normocephalic, atraumatic, sclera non-icteric, no xanthomas, nares are without  discharge.  Neck: Negative for carotid bruits. JVD elevated Lungs: Bibasilar rales, tachypneic Heart: Tachycardic. No murmurs, rubs, or gallops appreciated. Abdomen: Soft, non-tender, non-distended with normoactive bowel sounds. No hepatomegaly. No rebound/guarding. No obvious abdominal masses. Msk:  Strength and tone appear normal for age. Extremities: Status-post amputation above the knee in the left leg and TMT right leg. Well healed. 1+ edema bilaterally  Neuro: Alert and oriented X 3. No focal  deficit. No facial asymmetry. Moves all extremities spontaneously. Psych:  Responds to questions appropriately with a normal affect.    Assessment and Plan  Edward [name] is a 47 y.o. male with history of hypertension, chronic kidney disease, poorly controlled type 2 diabetes mellitus complicated by peripheral neuropathy, peripheral vascular disease status-post right BKA and left transmetatarsal amputation, tobacco use, who presents with worsening dyspnea for the past two days and found to have acute hypoxic respiratory failure and NSTEMI.  1. Acute heart failure 2. Acute hypoxic respiratory failure  He presents with two days of worsening congestive heart failure symptoms and acute hypoxic respiratory failure requiring BIPAP secondary to cardiogenic pulmonary edema. This may be secondary to poorly controlled blood pressure or a new presentation of ischemic cardiomyopathy, or non-ischemic cardiomyopathy due to many years of poorly controlled hypertension and diabetes. He will be treated with ongoing blood pressure lowering with IV NTGL, intravenous diuresis, and be admitted for diagnostic evaluation. - Supplemental Oxygen  - Intravenous nitroglycerine infusion titrate to MAP < 90  - IMSN-Hydralazine TID - Metoprolol succinate 25 starting tomorrow @ 10 AM - Furosemide 80 mg IV received in ER with adequate UOP, will plan on re-dosing in AM - Telemetry - K > 4 Mg > 2 - Monitor renal function  -  Holding ACE/ARB with acute on chronic renal injury - Transthoracic echocardiogram  3. Non-ST segment myocardial infarction. His troponin is markedly elevated beyond what would be expected from hypertensive emergency/acute heart failure. He has no typical ACS symptoms however considering his poorly controlled diabetes mellitus we will need to entertain type 1 ACS and treat empirically for now with later consideration of invasive management. - Heparin infusion - Hold beta blocker as above - Aspirin 81  - Cannot currently lay flat for LHC, continue diuresis as above - LDL, TSH,  - Atorvastatin 80   4. Diabetes mellitus complicated by peripheral neuropathy - Hold home oral hypoglycemic agents with acute kidney injury - Continue gabapentin 100 qhs - Weight based insulin 0.25 U/ kg divided into 4 shot regimen + SSI   5. Tobacco use. Smoking cessation counseling.  Severity of Illness: The appropriate patient status for this patient is INPATIENT. Inpatient status is judged to be reasonable and necessary in order to provide the required intensity of service to ensure the patient's safety. The patient's presenting symptoms, physical exam findings, and initial radiographic and laboratory data in the context of their chronic comorbidities is felt to place them at high risk for further clinical deterioration. Furthermore, it is not anticipated that the patient will be medically stable for discharge from the hospital within 2 midnights of admission. The following factors support the patient status of inpatient.   " The patient's presenting symptoms include dyspnea cough. " The worrisome physical exam findings include tachypnea acute distress diaphoresis. " The initial radiographic and laboratory data are worrisome because of elevated troponin, elevated BNP, elevated creatinine, decreased hemoglobin, bilateral opacities on chest x-ray. " The chronic co-morbidities include hypertension diabetes mellitus  peripheral vascular disease neuropathy.   * I certify that at the point of admission it is my clinical judgment that the patient will require inpatient hospital care spanning beyond 2 midnights from the point of admission due to high intensity of service, high risk for further deterioration and high frequency of surveillance required.*    For questions or updates, please contact [place] Please consult [email or web address] for contact info under Cardiology/STEMI.  Signed, Perley Jain, MD 07/28/2018, 2:18 AM

## 2018-07-29 ENCOUNTER — Encounter (HOSPITAL_COMMUNITY)
Admission: EM | Disposition: A | Discharge status: Home / Self Care | Payer: Self-pay | Source: Home / Self Care | Attending: Interventional Cardiology

## 2018-07-29 ENCOUNTER — Inpatient Hospital Stay (HOSPITAL_COMMUNITY): Payer: Medicaid Other

## 2018-07-29 ENCOUNTER — Encounter (HOSPITAL_COMMUNITY): Payer: Self-pay

## 2018-07-29 DIAGNOSIS — I5041 Acute combined systolic (congestive) and diastolic (congestive) heart failure: Secondary | ICD-10-CM

## 2018-07-29 DIAGNOSIS — I10 Essential (primary) hypertension: Secondary | ICD-10-CM

## 2018-07-29 DIAGNOSIS — R0989 Other specified symptoms and signs involving the circulatory and respiratory systems: Secondary | ICD-10-CM

## 2018-07-29 DIAGNOSIS — I2510 Atherosclerotic heart disease of native coronary artery without angina pectoris: Secondary | ICD-10-CM

## 2018-07-29 HISTORY — PX: CORONARY STENT INTERVENTION: CATH118234

## 2018-07-29 HISTORY — PX: CARDIAC CATHETERIZATION: SHX172

## 2018-07-29 HISTORY — PX: LEFT HEART CATH AND CORONARY ANGIOGRAPHY: CATH118249

## 2018-07-29 LAB — BASIC METABOLIC PANEL
Anion gap: 7 (ref 5–15)
BUN: 28 mg/dL — ABNORMAL HIGH (ref 6–20)
CO2: 23 mmol/L (ref 22–32)
Calcium: 8.2 mg/dL — ABNORMAL LOW (ref 8.9–10.3)
Chloride: 108 mmol/L (ref 98–111)
Creatinine, Ser: 1.82 mg/dL — ABNORMAL HIGH (ref 0.61–1.24)
Glucose, Bld: 136 mg/dL — ABNORMAL HIGH (ref 70–99)
Potassium: 3.8 mmol/L (ref 3.5–5.1)
Sodium: 138 mmol/L (ref 135–145)

## 2018-07-29 LAB — HEPARIN LEVEL (UNFRACTIONATED)
HEPARIN UNFRACTIONATED: 0.23 [IU]/mL — AB (ref 0.30–0.70)
Heparin Unfractionated: 0.5 IU/mL (ref 0.30–0.70)

## 2018-07-29 LAB — CBC
HCT: 32.1 % — ABNORMAL LOW (ref 39.0–52.0)
Hemoglobin: 10.1 g/dL — ABNORMAL LOW (ref 13.0–17.0)
MCH: 26.4 pg (ref 26.0–34.0)
MCHC: 31.5 g/dL (ref 30.0–36.0)
MCV: 84 fL (ref 80.0–100.0)
Platelets: 265 10*3/uL (ref 150–400)
RBC: 3.82 MIL/uL — ABNORMAL LOW (ref 4.22–5.81)
RDW: 14.6 % (ref 11.5–15.5)
WBC: 7.3 10*3/uL (ref 4.0–10.5)
nRBC: 0 % (ref 0.0–0.2)

## 2018-07-29 LAB — GLUCOSE, CAPILLARY
Glucose-Capillary: 134 mg/dL — ABNORMAL HIGH (ref 70–99)
Glucose-Capillary: 172 mg/dL — ABNORMAL HIGH (ref 70–99)
Glucose-Capillary: 67 mg/dL — ABNORMAL LOW (ref 70–99)
Glucose-Capillary: 166 mg/dL — ABNORMAL HIGH (ref 70–99)

## 2018-07-29 LAB — POCT ACTIVATED CLOTTING TIME
ACTIVATED CLOTTING TIME: 296 s
Activated Clotting Time: 235 seconds

## 2018-07-29 LAB — HEMOGLOBIN A1C
Hgb A1c MFr Bld: 6.7 % — ABNORMAL HIGH (ref 4.8–5.6)
MEAN PLASMA GLUCOSE: 145.59 mg/dL

## 2018-07-29 LAB — MRSA PCR SCREENING: MRSA by PCR: NEGATIVE

## 2018-07-29 SURGERY — LEFT HEART CATH AND CORONARY ANGIOGRAPHY
Anesthesia: LOCAL

## 2018-07-29 MED ORDER — NITROGLYCERIN 1 MG/10 ML FOR IR/CATH LAB
INTRA_ARTERIAL | Status: DC | PRN
  Administered 2018-07-29 (×2): 200 ug via INTRACORONARY

## 2018-07-29 MED ORDER — METOPROLOL SUCCINATE ER 50 MG PO TB24
50.0000 mg | ORAL_TABLET | Freq: Every day | ORAL | Status: DC
  Administered 2018-07-30 – 2018-07-31 (×2): 50 mg via ORAL
  Filled 2018-07-29 (×2): qty 1

## 2018-07-29 MED ORDER — HEPARIN (PORCINE) IN NACL 1000-0.9 UT/500ML-% IV SOLN
INTRAVENOUS | Status: DC | PRN
  Administered 2018-07-29: 500 mL

## 2018-07-29 MED ORDER — LIDOCAINE HCL (PF) 1 % IJ SOLN
INTRAMUSCULAR | Status: AC
  Filled 2018-07-29: qty 30

## 2018-07-29 MED ORDER — ADULT MULTIVITAMIN W/MINERALS CH
1.0000 | ORAL_TABLET | Freq: Every day | ORAL | Status: DC
  Administered 2018-07-29 – 2018-07-31 (×3): 1 via ORAL
  Filled 2018-07-29 (×3): qty 1

## 2018-07-29 MED ORDER — VERAPAMIL HCL 2.5 MG/ML IV SOLN
INTRAVENOUS | Status: DC | PRN
  Administered 2018-07-29: 10 mL via INTRA_ARTERIAL

## 2018-07-29 MED ORDER — HEPARIN SODIUM (PORCINE) 1000 UNIT/ML IJ SOLN
INTRAMUSCULAR | Status: DC | PRN
  Administered 2018-07-29: 4000 [IU] via INTRAVENOUS
  Administered 2018-07-29 (×2): 5000 [IU] via INTRAVENOUS

## 2018-07-29 MED ORDER — LIDOCAINE HCL (PF) 1 % IJ SOLN
INTRAMUSCULAR | Status: DC | PRN
  Administered 2018-07-29: 2 mL

## 2018-07-29 MED ORDER — ENOXAPARIN SODIUM 30 MG/0.3ML ~~LOC~~ SOLN
30.0000 mg | SUBCUTANEOUS | Status: DC
  Administered 2018-07-30 – 2018-07-31 (×2): 30 mg via SUBCUTANEOUS
  Filled 2018-07-29 (×2): qty 0.3

## 2018-07-29 MED ORDER — SODIUM CHLORIDE 0.9 % IV SOLN: INTRAVENOUS | Status: AC

## 2018-07-29 MED ORDER — MIDAZOLAM HCL 2 MG/2ML IJ SOLN
INTRAMUSCULAR | Status: AC
  Filled 2018-07-29: qty 2

## 2018-07-29 MED ORDER — IOHEXOL 350 MG/ML SOLN
INTRAVENOUS | Status: DC | PRN
  Administered 2018-07-29: 135 mL via INTRA_ARTERIAL

## 2018-07-29 MED ORDER — HEPARIN SODIUM (PORCINE) 1000 UNIT/ML IJ SOLN
INTRAMUSCULAR | Status: AC
  Filled 2018-07-29: qty 1

## 2018-07-29 MED ORDER — FUROSEMIDE 10 MG/ML IJ SOLN
40.0000 mg | Freq: Two times a day (BID) | INTRAMUSCULAR | Status: DC
  Administered 2018-07-29 – 2018-07-30 (×2): 40 mg via INTRAVENOUS
  Filled 2018-07-29 (×2): qty 4

## 2018-07-29 MED ORDER — SODIUM CHLORIDE 0.9 % IV SOLN: 250.0000 mL | INTRAVENOUS | Status: DC | PRN

## 2018-07-29 MED ORDER — HEPARIN (PORCINE) IN NACL 1000-0.9 UT/500ML-% IV SOLN
INTRAVENOUS | Status: AC
  Filled 2018-07-29: qty 1000

## 2018-07-29 MED ORDER — NITROGLYCERIN 1 MG/10 ML FOR IR/CATH LAB
INTRA_ARTERIAL | Status: AC
  Filled 2018-07-29: qty 10

## 2018-07-29 MED ORDER — VERAPAMIL HCL 2.5 MG/ML IV SOLN
INTRAVENOUS | Status: AC
  Filled 2018-07-29: qty 2

## 2018-07-29 MED ORDER — FENTANYL CITRATE (PF) 100 MCG/2ML IJ SOLN
INTRAMUSCULAR | Status: AC
  Filled 2018-07-29: qty 2

## 2018-07-29 MED ORDER — MIDAZOLAM HCL 2 MG/2ML IJ SOLN
INTRAMUSCULAR | Status: DC | PRN
  Administered 2018-07-29: 1 mg via INTRAVENOUS

## 2018-07-29 MED ORDER — TICAGRELOR 90 MG PO TABS
90.0000 mg | ORAL_TABLET | Freq: Two times a day (BID) | ORAL | Status: DC
  Administered 2018-07-29 – 2018-07-31 (×4): 90 mg via ORAL
  Filled 2018-07-29 (×4): qty 1

## 2018-07-29 MED ORDER — ALPRAZOLAM 0.5 MG PO TABS
0.5000 mg | ORAL_TABLET | Freq: Three times a day (TID) | ORAL | Status: DC | PRN
  Administered 2018-07-29 – 2018-07-31 (×3): 0.5 mg via ORAL
  Filled 2018-07-29 (×3): qty 1

## 2018-07-29 MED ORDER — TICAGRELOR 90 MG PO TABS
ORAL_TABLET | ORAL | Status: DC | PRN
  Administered 2018-07-29: 180 mg via ORAL

## 2018-07-29 MED ORDER — FENTANYL CITRATE (PF) 100 MCG/2ML IJ SOLN
INTRAMUSCULAR | Status: DC | PRN
  Administered 2018-07-29: 50 ug via INTRAVENOUS

## 2018-07-29 MED ORDER — SODIUM CHLORIDE 0.9% FLUSH
3.0000 mL | Freq: Two times a day (BID) | INTRAVENOUS | Status: DC
  Administered 2018-07-29 – 2018-07-31 (×4): 3 mL via INTRAVENOUS

## 2018-07-29 MED ORDER — SODIUM CHLORIDE 0.9% FLUSH: 3.0000 mL | INTRAVENOUS | Status: DC | PRN

## 2018-07-29 MED ORDER — ISOSORBIDE MONONITRATE ER 30 MG PO TB24
30.0000 mg | ORAL_TABLET | Freq: Every day | ORAL | Status: DC
Start: 2018-07-29 — End: 2018-07-31
  Administered 2018-07-29 – 2018-07-31 (×3): 30 mg via ORAL
  Filled 2018-07-29 (×3): qty 1

## 2018-07-29 MED ORDER — TICAGRELOR 90 MG PO TABS
ORAL_TABLET | ORAL | Status: AC
  Filled 2018-07-29: qty 2

## 2018-07-29 SURGICAL SUPPLY — 17 items
BALLN SAPPHIRE 2.25X20 (BALLOONS) ×2
BALLN SAPPHIRE ~~LOC~~ 3.0X15 (BALLOONS) ×1 IMPLANT
BALLOON SAPPHIRE 2.25X20 (BALLOONS) IMPLANT
CATH OPTITORQUE TIG 4.0 5F (CATHETERS) ×1 IMPLANT
CATH VISTA GUIDE 6FR XBLAD3.5 (CATHETERS) ×1 IMPLANT
DEVICE RAD COMP TR BAND LRG (VASCULAR PRODUCTS) ×1 IMPLANT
GLIDESHEATH SLEND A-KIT 6F 22G (SHEATH) ×1 IMPLANT
GUIDEWIRE INQWIRE 1.5J.035X260 (WIRE) IMPLANT
INQWIRE 1.5J .035X260CM (WIRE) ×2
KIT ENCORE 26 ADVANTAGE (KITS) ×1 IMPLANT
KIT HEART LEFT (KITS) ×2 IMPLANT
PACK CARDIAC CATHETERIZATION (CUSTOM PROCEDURE TRAY) ×2 IMPLANT
SHEATH PROBE COVER 6X72 (BAG) ×1 IMPLANT
STENT SYNERGY DES 2.5X32 (Permanent Stent) ×1 IMPLANT
TRANSDUCER W/STOPCOCK (MISCELLANEOUS) ×2 IMPLANT
TUBING CIL FLEX 10 FLL-RA (TUBING) ×2 IMPLANT
WIRE MINAMO 190 (WIRE) ×1 IMPLANT

## 2018-07-29 NOTE — Plan of Care (Signed)

## 2018-07-29 NOTE — Plan of Care (Signed)
°  Problem: Education: °Goal: Knowledge of General Education information will improve °Description: Including pain rating scale, medication(s)/side effects and non-pharmacologic comfort measures °Outcome: Progressing °  °Problem: Health Behavior/Discharge Planning: °Goal: Ability to manage health-related needs will improve °Outcome: Progressing °  °Problem: Clinical Measurements: °Goal: Ability to maintain clinical measurements within normal limits will improve °Outcome: Progressing °Goal: Will remain free from infection °Outcome: Progressing °Goal: Diagnostic test results will improve °Outcome: Progressing °Goal: Respiratory complications will improve °Outcome: Progressing °Goal: Cardiovascular complication will be avoided °Outcome: Progressing °  °Problem: Activity: °Goal: Risk for activity intolerance will decrease °Outcome: Progressing °  °Problem: Coping: °Goal: Level of anxiety will decrease °Outcome: Progressing °  °Problem: Elimination: °Goal: Will not experience complications related to bowel motility °Outcome: Progressing °  °Problem: Pain Managment: °Goal: General experience of comfort will improve °Outcome: Progressing °  °Problem: Safety: °Goal: Ability to remain free from injury will improve °Outcome: Progressing °  °Problem: Skin Integrity: °Goal: Risk for impaired skin integrity will decrease °Outcome: Progressing °  °

## 2018-07-29 NOTE — Progress Notes (Signed)
ANTICOAGULATION CONSULT NOTE  Pharmacy Consult for Heparin  Indication: chest pain/ACS  No Known Allergies   Vital Signs: Temp: 98.2 F (36.8 C) (12/11 0054) Temp Source: Oral (12/11 0054) BP: 138/94 (12/11 0054) Pulse Rate: 83 (12/11 0300)  Labs: Recent Labs    07/27/18 2347  07/28/18 0721 07/28/18 1139 07/28/18 1900 07/28/18 2105 07/29/18 0232  HGB 11.8*  --   --   --   --   --  10.1*  HCT 38.2*  --   --   --   --   --  32.1*  PLT 297  --   --   --   --   --  265  HEPARINUNFRC  --   --   --  <0.10* <0.10*  --  0.23*  CREATININE 1.69*  --  1.79*  --   --   --   --   TROPONINI  --    < > 2.89* 2.82*  --  2.41*  --    < > = values in this interval not displayed.    Estimated Creatinine Clearance: 61.9 mL/min (A) (by C-G formula based on SCr of 1.79 mg/dL (H)).  Assessment: 47 y/o M here with respiratory distress bump in trop peak at 3 now trending back down, EF 40% 12/10 - possible cath is Cr stable  Heparin level this am 0.23 units/ml.  Drawn ~ 4 hours after last rate change    Goal of Therapy:  Heparin level 0.3-0.7 units/ml Monitor platelets by anticoagulation protocol: Yes   Plan:  Increase heparin gtt to 1950 units/hr Check heparin level in 6-8 hours  Excell Seltzer, PharmD Clinical Pharmacist 07/29/2018 4:21 AM

## 2018-07-29 NOTE — Progress Notes (Signed)
Initial Nutrition Assessment  DOCUMENTATION CODES:   Not applicable  INTERVENTION:   - Add MVI with minerals given recent poor PO intake and low Hgb   NUTRITION DIAGNOSIS:   Increased nutrient needs related to acute illness as evidenced by estimated needs.  GOAL:   Patient will meet greater than or equal to 90% of their needs    MONITOR:   PO intake, Labs, Weight trends  REASON FOR ASSESSMENT:   Malnutrition Screening Tool    ASSESSMENT:   47 yo male, admitted with acute hypoxic respiratory failure and NSTEMI. PMH significant for HTN, CKD, poorly controlled L5BW complicated by peripheral neuropathy, PAD s/p R BKA and L transmetatarsal amputation, tobacco use. Home meds include insulin, metformin, miralax.  Labs: glucose 136, Hcg A1c 6.7% (down from 15.9% 05/14/18), BUN 28, Creatinine 1.82, GFR 44%/51% on 12/10, Hgb 10.1, Hct 32.1% Meds: Lipitor 80 mg daily, novolog at bedtime and TID with meals, lantus 20 units daily, miralax 1 pkt daily, senokot 8.6 mg tablet daily, NS infusion 40 mL/hr  Pt resting at time of nutrition visit.  UBW 217# (98.6 kg), stable for the last 6 months. Pt endorses ~20# wt loss over the past 1 year. Per chart, pt weighted 109 kg in June 2018 --> 11.3 kg (10%) over 17 months is not clinically significant. Some mild fat and muscle depletion, but not enough evidence to support a dx of malnutrition.  Pt reports appetite is coming back after being poor for ~2 days. Typically eats 2 meals daily. Tries to eat a balanced diet, avoids fast food. Does not take MVI at home.  Denies nausea or vomiting, diarrhea, or difficulty chewing or swallowing. Endorses some constipation - last BM 12/7, does not have any discomfort at this time.  Encouraged pt to include protein-rich foods with meals and snacks, and to eat those foods first if not feeling very hungry. Pt declined nutritional supplements at this time.   NUTRITION - FOCUSED PHYSICAL EXAM:   Most Recent  Value  Orbital Region  No depletion  Upper Arm Region  Mild depletion  Thoracic and Lumbar Region  No depletion  Buccal Region  No depletion  Temple Region  Mild depletion  Clavicle Bone Region  No depletion  Clavicle and Acromion Bone Region  No depletion  Scapular Bone Region  Mild depletion  Dorsal Hand  No depletion  Patellar Region  Mild depletion  Anterior Thigh Region  Mild depletion  Posterior Calf Region  Mild depletion  Edema (RD Assessment)  None  Hair  Reviewed  Eyes  Reviewed  Mouth  Reviewed  Skin  Reviewed  Nails  Other (Comment) pale/white      Diet Order:   100% breakfast today, per nsg documentation Diet Order            Diet NPO time specified Except for: Sips with Meds  Diet effective 1000        Diet NPO time specified  Diet effective now              EDUCATION NEEDS:  No education needs have been identified at this time  Skin:  Skin Assessment: Reviewed RN Assessment  Last BM:  PTA  Height:  Ht Readings from Last 1 Encounters:  07/28/18 6\' 1"  (1.854 m)    Weight:  Wt Readings from Last 1 Encounters:  07/29/18 97.7 kg    Ideal Body Weight:  83.6 kg  BMI:  Body mass index is 28.42 kg/m.  Estimated Nutritional Needs:  Kcal:  2090-2508 calories daily (25-30 kcal/kg IBW)  Protein:  84-100 gm daily (1.0-1.2 g/kg IBW)  Fluid:  >/= 2 L daily or per MD discretion   Althea Grimmer, MS, RDN, LDN Pager: [phone]

## 2018-07-29 NOTE — Progress Notes (Signed)
Progress Note  Patient Name: KELYN KOSKELA Date of Encounter: 07/29/2018  Primary Cardiologist: Kirk Ruths, MD   Subjective   Denies CP; dyspnea improving  Inpatient Medications    Scheduled Meds: . aspirin EC  81 mg Oral Daily  . atorvastatin  80 mg Oral q1800  . gabapentin  100 mg Oral QHS  . hydrALAZINE  25 mg Oral Q8H  . insulin aspart  0-5 Units Subcutaneous QHS  . insulin aspart  0-9 Units Subcutaneous TID WC  . insulin aspart  6 Units Subcutaneous TID WC  . insulin glargine  20 Units Subcutaneous Daily  . metoprolol succinate  25 mg Oral Daily  . polyethylene glycol  17 g Oral Daily  . senna  1 tablet Oral QHS  . sodium chloride flush  3 mL Intravenous Q12H  . sodium chloride flush  3 mL Intravenous Q12H   Continuous Infusions: . sodium chloride    . sodium chloride    . sodium chloride 40 mL/hr at 07/29/18 0532  . heparin 1,950 Units/hr (07/29/18 0436)  . nitroGLYCERIN Stopped (07/28/18 1139)   PRN Meds: sodium chloride, sodium chloride, acetaminophen, ondansetron (ZOFRAN) IV, sodium chloride flush, sodium chloride flush, traMADol   Vital Signs    Vitals:   07/29/18 0300 07/29/18 0433 07/29/18 0438 07/29/18 0800  BP:  (!) 147/101  (!) 143/102  Pulse: 83 84  88  Resp: (!) 24 (!) 21  (!) 24  Temp:  97.9 F (36.6 C)  98 F (36.7 C)  TempSrc:  Oral  Oral  SpO2: 99% 95%  98%  Weight:   97.7 kg   Height:        Intake/Output Summary (Last 24 hours) at 07/29/2018 0914 Last data filed at 07/29/2018 0400 Gross per 24 hour  Intake 569.21 ml  Output 2000 ml  Net -1430.79 ml   Filed Weights   07/28/18 2100 07/29/18 0438  Weight: 97 kg 97.7 kg    Telemetry    Sinus - Personally Reviewed  Physical Exam   GEN: No acute distress.   Neck: No JVD Cardiac: RRR, no murmurs, rubs, or gallops.  Respiratory: Clear to auscultation bilaterally. GI: Soft, nontender, non-distended  MS: No edema, status post amputation right lower extremity and  metatarsal amputation left lower extremity Neuro:  Nonfocal  Psych: Normal affect   Labs    Chemistry Recent Labs  Lab 07/27/18 2347 07/28/18 0721 07/29/18 0232  NA 137 137 138  K 4.2 3.8 3.8  CL 107 107 108  CO2 18* 19* 23  GLUCOSE 183* 153* 136*  BUN 26* 27* 28*  CREATININE 1.69* 1.79* 1.82*  CALCIUM 8.9 8.4* 8.2*  GFRNONAA 47* 44* NOT CALCULATED  GFRAA 54* 51* NOT CALCULATED  ANIONGAP 12 11 7      Hematology Recent Labs  Lab 07/27/18 2347 07/29/18 0232  WBC 8.2 7.3  RBC 4.44 3.82*  HGB 11.8* 10.1*  HCT 38.2* 32.1*  MCV 86.0 84.0  MCH 26.6 26.4  MCHC 30.9 31.5  RDW 14.6 14.6  PLT 297 265    Cardiac Enzymes Recent Labs  Lab 07/28/18 0019 07/28/18 0721 07/28/18 1139 07/28/18 2105  TROPONINI 3.01* 2.89* 2.82* 2.41*    BNP Recent Labs  Lab 07/28/18 0019  BNP 436.7*     Radiology    Dg Chest Port 1 View  Result Date: 07/27/2018 CLINICAL DATA:  Shortness of breath EXAM: PORTABLE CHEST 1 VIEW COMPARISON:  05/07/2016 FINDINGS: Cardiomegaly with vascular congestion, pleural effusions and moderate  pulmonary edema. Consolidations at the bases. No pneumothorax. IMPRESSION: 1. Cardiomegaly with vascular congestion and moderate pulmonary edema. There are bilateral pleural effusions. 2. Dense airspace disease at the bases may reflect atelectasis or pneumonia Electronically Signed   By: Donavan Foil M.D.   On: 07/27/2018 23:58    Patient Profile     47 y.o. male with past medical history of diabetes mellitus, peripheral vascular disease with prior right BKA and left metatarsal amputation, tobacco abuse, hypertension, chronic stage III kidney disease admitted with congestive heart failure and non-ST elevation myocardial infarction.  Echocardiogram shows ejection fraction 40 to 45%, moderate left ventricular hypertrophy, mild left ventricular enlargement, mild aortic insufficiency, mild to moderate mitral regurgitation, mild left atrial enlargement and small  pericardial effusion.  Assessment & Plan    1 non-ST elevation myocardial infarction-patient has not had chest pain but enzymes abnormal.  Plan to continue aspirin, heparin, statin and beta-blocker.  Proceed with cardiac catheterization.  The risks and benefits including myocardial infarction, CVA, death and worsening renal function discussed and he agrees to proceed.  2 acute combined systolic/diastolic congestive heart failure-volume status improved.  Hold Lasix today prior to catheterization but will likely need low-dose at time of discharge.  Needs fluid restriction and low-sodium diet.  3 cardiomyopathy-etiology unclear.  Echocardiogram shows ejection fraction 40 to 45%.  May be hypertensive mediated but coronary artery disease needs to be excluded and we will proceed with catheterization as outlined above.  4 hypertension-blood pressure improving.  Continue Toprol but increase to 50 mg daily.  Continue hydralazine.  Add isosorbide 30 mg daily.  Follow blood pressure and adjust regimen as needed.  I am not adding an ACE inhibitor or ARB given renal insufficiency.  5 chronic stage III kidney disease-follow renal function after catheterization.  Limit dye.  No ventriculogram.  6 diabetes mellitus-follow CBGs.  7 tobacco abuse-patient counseled on discontinuing.  For questions or updates, please contact [place] Please consult [email or web address] for contact info under        Signed, Kirk Ruths, MD  07/29/2018, 9:14 AM

## 2018-07-29 NOTE — Progress Notes (Signed)
Swelling noted at radial site. Arm elevated and immobilized. No bruising or bleeding noted. Will continue to monitor.

## 2018-07-29 NOTE — Progress Notes (Signed)
ABI's have been completed. Right BKA Left 0.92  07/29/18 10:30 AM Carlos Levering RVT

## 2018-07-29 NOTE — H&P (View-Only) (Signed)
Progress Note  Patient Name: Edward [name] Date of Encounter: 07/29/2018  Primary Cardiologist: Kirk Ruths, MD   Subjective   Denies CP; dyspnea improving  Inpatient Medications    Scheduled Meds: . aspirin EC  81 mg Oral Daily  . atorvastatin  80 mg Oral q1800  . gabapentin  100 mg Oral QHS  . hydrALAZINE  25 mg Oral Q8H  . insulin aspart  0-5 Units Subcutaneous QHS  . insulin aspart  0-9 Units Subcutaneous TID WC  . insulin aspart  6 Units Subcutaneous TID WC  . insulin glargine  20 Units Subcutaneous Daily  . metoprolol succinate  25 mg Oral Daily  . polyethylene glycol  17 g Oral Daily  . senna  1 tablet Oral QHS  . sodium chloride flush  3 mL Intravenous Q12H  . sodium chloride flush  3 mL Intravenous Q12H   Continuous Infusions: . sodium chloride    . sodium chloride    . sodium chloride 40 mL/hr at 07/29/18 0532  . heparin 1,950 Units/hr (07/29/18 0436)  . nitroGLYCERIN Stopped (07/28/18 1139)   PRN Meds: sodium chloride, sodium chloride, acetaminophen, ondansetron (ZOFRAN) IV, sodium chloride flush, sodium chloride flush, traMADol   Vital Signs    Vitals:   07/29/18 0300 07/29/18 0433 07/29/18 0438 07/29/18 0800  BP:  (!) 147/101  (!) 143/102  Pulse: 83 84  88  Resp: (!) 24 (!) 21  (!) 24  Temp:  97.9 F (36.6 C)  98 F (36.7 C)  TempSrc:  Oral  Oral  SpO2: 99% 95%  98%  Weight:   97.7 kg   Height:        Intake/Output Summary (Last 24 hours) at 07/29/2018 0914 Last data filed at 07/29/2018 0400 Gross per 24 hour  Intake 569.21 ml  Output 2000 ml  Net -1430.79 ml   Filed Weights   07/28/18 2100 07/29/18 0438  Weight: 97 kg 97.7 kg    Telemetry    Sinus - Personally Reviewed  Physical Exam   GEN: No acute distress.   Neck: No JVD Cardiac: RRR, no murmurs, rubs, or gallops.  Respiratory: Clear to auscultation bilaterally. GI: Soft, nontender, non-distended  MS: No edema, status post amputation right lower extremity and  metatarsal amputation left lower extremity Neuro:  Nonfocal  Psych: Normal affect   Labs    Chemistry Recent Labs  Lab 07/27/18 2347 07/28/18 0721 07/29/18 0232  NA 137 137 138  K 4.2 3.8 3.8  CL 107 107 108  CO2 18* 19* 23  GLUCOSE 183* 153* 136*  BUN 26* 27* 28*  CREATININE 1.69* 1.79* 1.82*  CALCIUM 8.9 8.4* 8.2*  GFRNONAA 47* 44* NOT CALCULATED  GFRAA 54* 51* NOT CALCULATED  ANIONGAP 12 11 7      Hematology Recent Labs  Lab 07/27/18 2347 07/29/18 0232  WBC 8.2 7.3  RBC 4.44 3.82*  HGB 11.8* 10.1*  HCT 38.2* 32.1*  MCV 86.0 84.0  MCH 26.6 26.4  MCHC 30.9 31.5  RDW 14.6 14.6  PLT 297 265    Cardiac Enzymes Recent Labs  Lab 07/28/18 0019 07/28/18 0721 07/28/18 1139 07/28/18 2105  TROPONINI 3.01* 2.89* 2.82* 2.41*    BNP Recent Labs  Lab 07/28/18 0019  BNP 436.7*     Radiology    Dg Chest Port 1 View  Result Date: 07/27/2018 CLINICAL DATA:  Shortness of breath EXAM: PORTABLE CHEST 1 VIEW COMPARISON:  05/07/2016 FINDINGS: Cardiomegaly with vascular congestion, pleural effusions and moderate  pulmonary edema. Consolidations at the bases. No pneumothorax. IMPRESSION: 1. Cardiomegaly with vascular congestion and moderate pulmonary edema. There are bilateral pleural effusions. 2. Dense airspace disease at the bases may reflect atelectasis or pneumonia Electronically Signed   By: Donavan Foil M.D.   On: 07/27/2018 23:58    Patient Profile     47 y.o. male with past medical history of diabetes mellitus, peripheral vascular disease with prior right BKA and left metatarsal amputation, tobacco abuse, hypertension, chronic stage III kidney disease admitted with congestive heart failure and non-ST elevation myocardial infarction.  Echocardiogram shows ejection fraction 40 to 45%, moderate left ventricular hypertrophy, mild left ventricular enlargement, mild aortic insufficiency, mild to moderate mitral regurgitation, mild left atrial enlargement and small  pericardial effusion.  Assessment & Plan    1 non-ST elevation myocardial infarction-patient has not had chest pain but enzymes abnormal.  Plan to continue aspirin, heparin, statin and beta-blocker.  Proceed with cardiac catheterization.  The risks and benefits including myocardial infarction, CVA, death and worsening renal function discussed and he agrees to proceed.  2 acute combined systolic/diastolic congestive heart failure-volume status improved.  Hold Lasix today prior to catheterization but will likely need low-dose at time of discharge.  Needs fluid restriction and low-sodium diet.  3 cardiomyopathy-etiology unclear.  Echocardiogram shows ejection fraction 40 to 45%.  May be hypertensive mediated but coronary artery disease needs to be excluded and we will proceed with catheterization as outlined above.  4 hypertension-blood pressure improving.  Continue Toprol but increase to 50 mg daily.  Continue hydralazine.  Add isosorbide 30 mg daily.  Follow blood pressure and adjust regimen as needed.  I am not adding an ACE inhibitor or ARB given renal insufficiency.  5 chronic stage III kidney disease-follow renal function after catheterization.  Limit dye.  No ventriculogram.  6 diabetes mellitus-follow CBGs.  7 tobacco abuse-patient counseled on discontinuing.  For questions or updates, please contact [place] Please consult [email or web address] for contact info under        Signed, Kirk Ruths, MD  07/29/2018, 9:14 AM

## 2018-07-29 NOTE — Interval H&P Note (Signed)
History and Physical Interval Note:  07/29/2018 3:20 PM  Edward [name]  has presented today for surgery, with the diagnosis of nstemi with cardiomyopathy.    The various methods of treatment have been discussed with the patient and family. After consideration of risks, benefits and other options for treatment, the patient has consented to  Procedure(s): LEFT HEART CATH AND CORONARY ANGIOGRAPHY (N/A) with possible PERCUTANEOUS CORONARY INTERVENTION as a surgical intervention .  The patient's history has been reviewed, patient examined, no change in status, stable for surgery.  I have reviewed the patient's chart and labs.  Questions were answered to the patient's satisfaction.    Cath Lab Visit (complete for each Cath Lab visit)  Clinical Evaluation Leading to the Procedure:   ACS: Yes.    Non-ACS:    Anginal Classification: [name] III - MOSTLY CHF Sx  Anti-ischemic medical therapy: Minimal Therapy (1 class of medications)  Non-Invasive Test Results: No non-invasive testing performed  Prior CABG: No previous CABG    Glenetta Hew

## 2018-07-29 NOTE — Progress Notes (Signed)
ANTICOAGULATION CONSULT NOTE  Pharmacy Consult for Heparin  Indication: chest pain/ACS  No Known Allergies   Vital Signs: Temp: 98 F (36.7 C) (12/11 0800) Temp Source: Oral (12/11 0800) BP: 143/102 (12/11 0800) Pulse Rate: 88 (12/11 0800)  Labs: Recent Labs    07/27/18 2347  07/28/18 0721  07/28/18 1139 07/28/18 1900 07/28/18 2105 07/29/18 0232 07/29/18 1033  HGB 11.8*  --   --   --   --   --   --  10.1*  --   HCT 38.2*  --   --   --   --   --   --  32.1*  --   PLT 297  --   --   --   --   --   --  265  --   HEPARINUNFRC  --   --   --    < > <0.10* <0.10*  --  0.23* 0.50  CREATININE 1.69*  --  1.79*  --   --   --   --  1.82*  --   TROPONINI  --    < > 2.89*  --  2.82*  --  2.41*  --   --    < > = values in this interval not displayed.    Estimated Creatinine Clearance: 61.1 mL/min (A) (by C-G formula based on SCr of 1.82 mg/dL (H)).  Assessment: 47 y/o M here with respiratory distress bump in trop peak at 3 now trending back down, EF 40% 12/10 - possible cath is Cr stable  Follow up heparin level this am 0.5 units/ml. No issues noted. Scheduled for cath later today.   Goal of Therapy:  Heparin level 0.3-0.7 units/ml Monitor platelets by anticoagulation protocol: Yes   Plan:  Continue heparin gtt at [place] units/hr Follow up plans after cath  Erin Hearing PharmD., BCPS Clinical Pharmacist 07/29/2018 12:42 PM

## 2018-07-29 NOTE — Progress Notes (Signed)
Inpatient Diabetes Program Recommendations  AACE/ADA: New Consensus Statement on Inpatient Glycemic Control (2015)  Target Ranges:  Prepandial:   less than 140 mg/dL      Peak postprandial:   less than 180 mg/dL (1-2 hours)      Critically ill patients:  140 - 180 mg/dL   Lab Results  Component Value Date   GLUCAP 172 (H) 07/29/2018   HGBA1C 6.7 (H) 07/29/2018    Review of Glycemic ControlResults for JOHAN, CREVELING (MRN [id number]) as of 07/29/2018 13:27  Ref. Range 07/28/2018 10:06 07/28/2018 11:53 07/28/2018 14:52 07/28/2018 16:36 07/28/2018 21:49 07/29/2018 08:11 07/29/2018 12:09  Glucose-Capillary Latest Ref Range: 70 - 99 mg/dL 123 (H) 109 (H) 63 (L) 181 (H) 169 (H) 134 (H) 172 (H)    Diabetes history: DM 2 Outpatient Diabetes medications:  Novolog 70/30 mix 15 units bid, Janumet 50-1000 mg bid Current orders for Inpatient glycemic control:  Novolog sensitive tid with meals and HS, Lantus 20 units daily,  Novolog 6 units tid with meals Inpatient Diabetes Program Recommendations:   Note that A1C has dropped significantly.  Consider reducing Novolog meal coverage to 3 units tid with meals.  Will follow.   Thanks,  Adah Perl, RN, BC-ADM Inpatient Diabetes Coordinator Pager [phone]

## 2018-07-30 ENCOUNTER — Encounter (HOSPITAL_COMMUNITY): Payer: Self-pay | Admitting: Cardiology

## 2018-07-30 DIAGNOSIS — I214 Non-ST elevation (NSTEMI) myocardial infarction: Principal | ICD-10-CM

## 2018-07-30 LAB — BASIC METABOLIC PANEL
Anion gap: 11 (ref 5–15)
BUN: 24 mg/dL — ABNORMAL HIGH (ref 6–20)
CO2: 21 mmol/L — AB (ref 22–32)
CREATININE: 1.78 mg/dL — AB (ref 0.61–1.24)
Calcium: 8.2 mg/dL — ABNORMAL LOW (ref 8.9–10.3)
Chloride: 108 mmol/L (ref 98–111)
GFR calc Af Amer: 51 mL/min — ABNORMAL LOW (ref >60)
GFR calc non Af Amer: 44 mL/min — ABNORMAL LOW (ref >60)
Glucose, Bld: 136 mg/dL — ABNORMAL HIGH (ref 70–99)
Potassium: 4 mmol/L (ref 3.5–5.1)
Sodium: 140 mmol/L (ref 135–145)

## 2018-07-30 LAB — CBC
HCT: 32.2 % — ABNORMAL LOW (ref 39.0–52.0)
Hemoglobin: 9.8 g/dL — ABNORMAL LOW (ref 13.0–17.0)
MCH: 25.8 pg — AB (ref 26.0–34.0)
MCHC: 30.4 g/dL (ref 30.0–36.0)
MCV: 84.7 fL (ref 80.0–100.0)
PLATELETS: 297 10*3/uL (ref 150–400)
RBC: 3.8 MIL/uL — ABNORMAL LOW (ref 4.22–5.81)
RDW: 14.9 % (ref 11.5–15.5)
WBC: 5.8 10*3/uL (ref 4.0–10.5)
nRBC: 0 % (ref 0.0–0.2)

## 2018-07-30 LAB — GLUCOSE, CAPILLARY
GLUCOSE-CAPILLARY: 120 mg/dL — AB (ref 70–99)
Glucose-Capillary: 146 mg/dL — ABNORMAL HIGH (ref 70–99)
Glucose-Capillary: 95 mg/dL (ref 70–99)
Glucose-Capillary: 136 mg/dL — ABNORMAL HIGH (ref 70–99)

## 2018-07-30 MED ORDER — FUROSEMIDE 10 MG/ML IJ SOLN
40.0000 mg | Freq: Two times a day (BID) | INTRAMUSCULAR | Status: DC
  Administered 2018-07-30 – 2018-07-31 (×2): 40 mg via INTRAVENOUS
  Filled 2018-07-30 (×2): qty 4

## 2018-07-30 MED ORDER — HYDRALAZINE HCL 50 MG PO TABS
50.0000 mg | ORAL_TABLET | Freq: Three times a day (TID) | ORAL | Status: DC
  Administered 2018-07-30 – 2018-07-31 (×4): 50 mg via ORAL
  Filled 2018-07-30 (×4): qty 1

## 2018-07-30 NOTE — Progress Notes (Signed)
Progress Note  Patient Name: Edward [name] Date of Encounter: 07/30/2018  Primary Cardiologist: Kirk Ruths, MD   Subjective   No CP; dyspnea continues to improve  Inpatient Medications    Scheduled Meds: . aspirin EC  81 mg Oral Daily  . atorvastatin  80 mg Oral q1800  . enoxaparin (LOVENOX) injection  30 mg Subcutaneous Q24H  . furosemide  40 mg Intravenous Q12H  . gabapentin  100 mg Oral QHS  . hydrALAZINE  25 mg Oral Q8H  . insulin aspart  0-5 Units Subcutaneous QHS  . insulin aspart  0-9 Units Subcutaneous TID WC  . insulin aspart  6 Units Subcutaneous TID WC  . insulin glargine  20 Units Subcutaneous Daily  . isosorbide mononitrate  30 mg Oral Daily  . metoprolol succinate  50 mg Oral Daily  . multivitamin with minerals  1 tablet Oral Daily  . polyethylene glycol  17 g Oral Daily  . senna  1 tablet Oral QHS  . sodium chloride flush  3 mL Intravenous Q12H  . sodium chloride flush  3 mL Intravenous Q12H  . ticagrelor  90 mg Oral BID   Continuous Infusions: . sodium chloride    . sodium chloride     PRN Meds: sodium chloride, sodium chloride, acetaminophen, ALPRAZolam, ondansetron (ZOFRAN) IV, sodium chloride flush, sodium chloride flush, traMADol   Vital Signs    Vitals:   07/29/18 1935 07/29/18 2319 07/30/18 0403 07/30/18 0806  BP: 133/68 123/78 (!) 132/91 (!) 146/98  Pulse: 73 89 95 83  Resp: (!) 21 (!) 21 18 12   Temp: 98 F (36.7 C) 98.1 F (36.7 C) 98.2 F (36.8 C) 97.9 F (36.6 C)  TempSrc: Oral Oral Oral Oral  SpO2: 92% 97% 97% 96%  Weight:   97.4 kg   Height:        Intake/Output Summary (Last 24 hours) at 07/30/2018 0940 Last data filed at 07/30/2018 0912 Gross per 24 hour  Intake 1203 ml  Output 2800 ml  Net -1597 ml   Filed Weights   07/28/18 2100 07/29/18 0438 07/30/18 0403  Weight: 97 kg 97.7 kg 97.4 kg    Telemetry    Sinus - Personally Reviewed  Physical Exam   GEN: No acute distress.  WD/WN Neck: No JVD,  supple Cardiac: RRR Respiratory: CTA GI: Soft, NT/ND MS: No edema, status post amputation right lower extremity and metatarsal amputation left lower extremity; radial cath site with ecchymosis but no hematoma Neuro:  Grossly intact   Labs    Chemistry Recent Labs  Lab 07/28/18 0721 07/29/18 0232 07/30/18 0212  NA 137 138 140  K 3.8 3.8 4.0  CL 107 108 108  CO2 19* 23 21*  GLUCOSE 153* 136* 136*  BUN 27* 28* 24*  CREATININE 1.79* 1.82* 1.78*  CALCIUM 8.4* 8.2* 8.2*  GFRNONAA 44* NOT CALCULATED 44*  GFRAA 51* NOT CALCULATED 51*  ANIONGAP 11 7 11      Hematology Recent Labs  Lab 07/27/18 2347 07/29/18 0232 07/30/18 0212  WBC 8.2 7.3 5.8  RBC 4.44 3.82* 3.80*  HGB 11.8* 10.1* 9.8*  HCT 38.2* 32.1* 32.2*  MCV 86.0 84.0 84.7  MCH 26.6 26.4 25.8*  MCHC 30.9 31.5 30.4  RDW 14.6 14.6 14.9  PLT 297 265 297    Cardiac Enzymes Recent Labs  Lab 07/28/18 0019 07/28/18 0721 07/28/18 1139 07/28/18 2105  TROPONINI 3.01* 2.89* 2.82* 2.41*    BNP Recent Labs  Lab 07/28/18 0019  BNP  436.7*     Radiology    Vas [place] Abi With/wo Tbi  Result Date: 07/29/2018 LOWER EXTREMITY DOPPLER STUDY Indications: Femoral bruit.  Comparison Study: 01/17/17 Right 0.93 L 0.96 Performing Technologist: Carlos Levering Rvt  Examination Guidelines: A complete evaluation includes at minimum, Doppler waveform signals and systolic blood pressure reading at the level of bilateral brachial, anterior tibial, and posterior tibial arteries, when vessel segments are accessible. Bilateral testing is considered an integral part of a complete examination. Photoelectric Plethysmograph (PPG) waveforms and toe systolic pressure readings are included as required and additional duplex testing as needed. Limited examinations for reoccurring indications may be performed as noted.  ABI Findings: +--------+------------------+-----+---------+--------+ Right   Rt Pressure (mmHg)IndexWaveform Comment   +--------+------------------+-----+---------+--------+ [id number]                    triphasic         +--------+------------------+-----+---------+--------+ PTA                                     BKA      +--------+------------------+-----+---------+--------+ DP                                      BKA      +--------+------------------+-----+---------+--------+ +---------+------------------+-----+---------+--------------------------+ Left     Lt Pressure (mmHg)IndexWaveform Comment                    +---------+------------------+-----+---------+--------------------------+ Brachial 145                    triphasic                           +---------+------------------+-----+---------+--------------------------+ PTA      114               0.79 triphasic                           +---------+------------------+-----+---------+--------------------------+ DP       133               0.92 triphasic                           +---------+------------------+-----+---------+--------------------------+ Great Toe                                Transmetatarsal amputation +---------+------------------+-----+---------+--------------------------+ +-------+-----------+-----------+------------+------------+ ABI/TBIToday's ABIToday's TBIPrevious ABIPrevious TBI +-------+-----------+-----------+------------+------------+ Right                        0.93                     +-------+-----------+-----------+------------+------------+ Left   0.92                  0.96                     +-------+-----------+-----------+------------+------------+  Summary: Right: Unable to obtain pressures due to previous below the knee amputation. Left: Resting left ankle-brachial index indicates mild left lower extremity arterial disease. Unable to obtain toe pressures due to previous transmetatarsal amputation.  *See table(s) above for measurements and  observations.  Electronically  signed by [name] MD on 07/29/2018 at 2:03:51 PM.   Final     Patient Profile     47 y.o. male with past medical history of diabetes mellitus, peripheral vascular disease with prior right BKA and left metatarsal amputation, tobacco abuse, hypertension, chronic stage III kidney disease admitted with congestive heart failure and non-ST elevation myocardial infarction.  Echocardiogram shows ejection fraction 40 to 45%, moderate left ventricular hypertrophy, mild left ventricular enlargement, mild aortic insufficiency, mild to moderate mitral regurgitation, mild left atrial enlargement and small pericardial effusion.  Assessment & Plan    1 non-ST elevation myocardial infarction-patient is status post PCI of left circumflex.  Plan to continue aspirin, Brilinta and statin.  Continue Toprol.   2 acute combined systolic/diastolic congestive heart failure-volume status improving.  Continue Lasix 40 mg IV twice daily.  Follow renal function.  Continue low-sodium diet and fluid restriction.  3 cardiomyopathy-etiology unclear.  Cardiomyopathy appears out of proportion to coronary disease.  Likely hypertensive mediated.  We will titrate medications and plan to reassess LV function in 3 months.  Continue beta-blocker, hydralazine/nitrates.  We will consider adding an ARB as an outpatient once it is clear renal function is stable.  Echocardiogram shows ejection fraction 40 to 45%.    4 hypertension-blood pressure improving but remains mildly elevated.  Increase hydralazine to 50 mg p.o. 3 times daily and follow.  5 chronic stage III kidney disease-renal function unchanged this morning.  Will recheck tomorrow morning.  6 diabetes mellitus-follow CBGs.  7 tobacco abuse-patient previously counseled on discontinuing.  For questions or updates, please contact [place] Please consult [email or web address] for contact info under        Signed, Kirk Ruths, MD  07/30/2018, 9:40 AM

## 2018-07-30 NOTE — Progress Notes (Signed)
Began ed with pt and children. Good reception. Discussed MI, stent, HF, smoking cessation and some diet. Encouraged him to read information and I will f/u tomorrow. [place], ACSM 3:21 PM 07/30/2018

## 2018-07-30 NOTE — Progress Notes (Addendum)
Mliss Sax gave in report that patients cardiac cath site was swollen since returning from cath lab.  She stated physician was aware before patient arrived to the unit.

## 2018-07-30 NOTE — Care Management (Signed)
#    7.   PRIMARY INS : MEDICAID [place]           EFF-DATE: July 01,2019           CO-PAY- $ 3.80 FOR EACH PRESCRIPTION  BRILINTA 90 MG BID

## 2018-07-30 NOTE — Progress Notes (Signed)
Right radial cardiac cath site is slightly bruised this morning.  However, swelling went down significantly last night by elevation on pillow.

## 2018-07-31 ENCOUNTER — Telehealth: Payer: Self-pay | Admitting: Cardiology

## 2018-07-31 LAB — CBC
HCT: 30.9 % — ABNORMAL LOW (ref 39.0–52.0)
Hemoglobin: 9.7 g/dL — ABNORMAL LOW (ref 13.0–17.0)
MCH: 26.4 pg (ref 26.0–34.0)
MCHC: 31.4 g/dL (ref 30.0–36.0)
MCV: 84.2 fL (ref 80.0–100.0)
Platelets: 283 10*3/uL (ref 150–400)
RBC: 3.67 MIL/uL — ABNORMAL LOW (ref 4.22–5.81)
RDW: 15 % (ref 11.5–15.5)
WBC: 6.3 10*3/uL (ref 4.0–10.5)
nRBC: 0 % (ref 0.0–0.2)

## 2018-07-31 LAB — BASIC METABOLIC PANEL
ANION GAP: 11 (ref 5–15)
BUN: 27 mg/dL — ABNORMAL HIGH (ref 6–20)
CALCIUM: 8.2 mg/dL — AB (ref 8.9–10.3)
CO2: 23 mmol/L (ref 22–32)
Chloride: 106 mmol/L (ref 98–111)
Creatinine, Ser: 2.01 mg/dL — ABNORMAL HIGH (ref 0.61–1.24)
GFR calc Af Amer: 44 mL/min — ABNORMAL LOW (ref >60)
GFR, EST NON AFRICAN AMERICAN: 38 mL/min — AB (ref >60)
Glucose, Bld: 126 mg/dL — ABNORMAL HIGH (ref 70–99)
POTASSIUM: 3.9 mmol/L (ref 3.5–5.1)
Sodium: 140 mmol/L (ref 135–145)

## 2018-07-31 LAB — HEPATIC FUNCTION PANEL
ALT: 12 U/L (ref 0–44)
AST: 18 U/L (ref 15–41)
Albumin: 2.7 g/dL — ABNORMAL LOW (ref 3.5–5.0)
Alkaline Phosphatase: 45 U/L (ref 38–126)
Bilirubin, Direct: <0.1 mg/dL (ref 0.0–0.2)
Total Bilirubin: 0.4 mg/dL (ref 0.3–1.2)
Total Protein: 5.7 g/dL — ABNORMAL LOW (ref 6.5–8.1)

## 2018-07-31 LAB — GLUCOSE, CAPILLARY
Glucose-Capillary: 94 mg/dL (ref 70–99)
Glucose-Capillary: 68 mg/dL — ABNORMAL LOW (ref 70–99)
Glucose-Capillary: 59 mg/dL — ABNORMAL LOW (ref 70–99)

## 2018-07-31 MED ORDER — ISOSORBIDE MONONITRATE ER 30 MG PO TB24: 30.0000 mg | ORAL_TABLET | Freq: Every day | ORAL | 3 refills | Status: DC

## 2018-07-31 MED ORDER — STUDY - AEGIS II STUDY - PLACEBO OR CSL112 (PI-HILTY)
170.0000 mL | INTRAVENOUS | Status: DC
  Administered 2018-07-31: 170 mL via INTRAVENOUS
  Filled 2018-07-31: qty 170

## 2018-07-31 MED ORDER — ASPIRIN 81 MG PO TBEC: 81.0000 mg | DELAYED_RELEASE_TABLET | Freq: Every day | ORAL | 3 refills | Status: DC

## 2018-07-31 MED ORDER — ADULT MULTIVITAMIN W/MINERALS CH: 1.0000 | ORAL_TABLET | Freq: Every day | ORAL | 3 refills | Status: DC

## 2018-07-31 MED ORDER — FUROSEMIDE 40 MG PO TABS: 40.0000 mg | ORAL_TABLET | Freq: Every day | ORAL | Status: DC

## 2018-07-31 MED ORDER — ATORVASTATIN CALCIUM 80 MG PO TABS: 80.0000 mg | ORAL_TABLET | Freq: Every day | ORAL | 3 refills | Status: DC

## 2018-07-31 MED ORDER — METOPROLOL SUCCINATE ER 50 MG PO TB24: 50.0000 mg | ORAL_TABLET | Freq: Every day | ORAL | 3 refills | Status: DC

## 2018-07-31 MED ORDER — TICAGRELOR 90 MG PO TABS: 90.0000 mg | ORAL_TABLET | Freq: Two times a day (BID) | ORAL | 3 refills | Status: DC

## 2018-07-31 MED ORDER — TICAGRELOR 90 MG PO TABS: 90.0000 mg | ORAL_TABLET | Freq: Two times a day (BID) | ORAL | 0 refills | Status: AC

## 2018-07-31 MED ORDER — HYDRALAZINE HCL 50 MG PO TABS: 50.0000 mg | ORAL_TABLET | Freq: Three times a day (TID) | ORAL | 3 refills | Status: DC

## 2018-07-31 MED ORDER — FUROSEMIDE 40 MG PO TABS: 40.0000 mg | ORAL_TABLET | Freq: Every day | ORAL | 3 refills | Status: DC

## 2018-07-31 MED FILL — ATORVASTATIN CALCIUM 80 MG: 80 | 30 days supply | Qty: 30 | Fill #0

## 2018-07-31 MED FILL — ISOSORBIDE MN ER 30 MG TAB: 30 | 30 days supply | Qty: 30 | Fill #0

## 2018-07-31 MED FILL — BRILINTA 90 MG TABLET: 90 | 30 days supply | Qty: 60 | Fill #0

## 2018-07-31 MED FILL — hydrALAZINE HCL 50 MG TABS: 50 | 30 days supply | Qty: 90 | Fill #0

## 2018-07-31 MED FILL — FUROSEMIDE 40 MG TAB: 40 | 30 days supply | Qty: 30 | Fill #0

## 2018-07-31 MED FILL — METOPROLOL SUCCINATE ER 50: 50 | 30 days supply | Qty: 30 | Fill #0

## 2018-07-31 NOTE — Progress Notes (Signed)
Discussed CRPII and did teach back with pt regarding low sodium, DM, HF management, and Brilinta. Voiced understanding and is interested in [place] at [facility]. Will send referral for "down the road" after he is done with his PT/prosthesis. [place], ACSM 1:55 PM 07/31/2018

## 2018-07-31 NOTE — Discharge Instructions (Signed)
Limit fluid intake to 6-8 cups per day. Limit salt intake to 2000 mg of sodium per day.     Acute Coronary Syndrome Acute coronary syndrome (ACS) is a serious problem in which there is suddenly not enough blood and oxygen reaching the heart. ACS can result in chest pain or a heart attack. What are the causes? This condition may be caused by:  A buildup of fat and cholesterol inside of the arteries (atherosclerosis). This is the most common cause. The buildup (plaque) can cause the blood vessels in your heart (coronary arteries) to become narrow or blocked. Plaque can also break off to form a clot.  A coronary spasm.  A tearing of the coronary artery (spontaneous coronary artery dissection).  Low blood pressure (hypotension).  An abnormal heart beat (arrhythmia).  Using cocaine or methamphetamine.  What increases the risk? The following factors may make you more likely to develop this condition:  Age.  History of chest pain, heart attack, or stroke.  Being male.  Family history of chest pain, heart disease, or stroke.  Smoking.  Inactivity.  Being overweight.  High cholesterol.  High blood pressure (hypertension).  Diabetes.  Excessive alcohol use.  What are the signs or symptoms? Common symptoms of this condition include:  Chest pain. The pain may last long, or may stop and come back (recur). It may feel like: ? Crushing or squeezing. ? Tightness, pressure, fullness, or heaviness.  Arm, neck, jaw, or back pain.  Heartburn or indigestion.  Shortness of breath.  Nausea.  Sudden cold sweats.  Lightheadedness.  Dizziness.  Tiredness (fatigue).  Sometimes there are no symptoms. How is this diagnosed? This condition may be diagnosed through:  An electrocardiogram (ECG). This test records the impulses of the heart.  Blood tests.  A CT scan of the chest.  A coronary angiogram. This procedure checks for a blockage in the coronary  arteries.  How is this treated? Treatment for this condition may include:  Oxygen.  Medicines, such as: ? Antiplatelet medicines and blood-thinning medicines, such as aspirin. These help prevent blood clots. ? Fibrinolytic therapy. This breaks apart a blood clot. ? Blood pressure medicines. ? Nitroglycerin. ? Pain medicine. ? Cholesterol medicine.  A procedure called coronary angioplasty and stenting. This is done to widen a narrowed artery and keep it open.  Coronary artery bypass surgery. This allows blood to pass the blockage to reach your heart.  Cardiac rehabilitation. This is a program that helps improve your health and well-being. It includes exercise training, education, and counseling to help you recover.  Follow these instructions at home: Eating and drinking  Follow a heart-healthy, low-salt (sodium) diet.  Use healthy cooking methods such as roasting, grilling, broiling, baking, poaching, steaming, or stir-frying.  Talk to a dietitian to learn about healthy cooking methods and how to eat less sodium. Medicines  Take over-the-counter and prescription medicines only as told by your health care provider.  Do not take these medicines unless your health care provider approves: ? Nonsteroidal anti-inflammatory drugs (NSAIDs), such as ibuprofen, naproxen, or celecoxib. ? Vitamin supplements that contain vitamin A or vitamin E. ? Hormone replacement therapy that contains estrogen. Activity  Join a cardiac rehabilitation program.  Ask your health care provider: ? What activities and exercises are safe for you. ? If you should follow specific instructions about lifting, driving, or climbing stairs.  If you are taking aspirin and another blood thinning medicine, avoid activities that are likely to result in an injury. The  medicines can increase your risk of bleeding. Lifestyle  Do not use any products that contain nicotine or tobacco, such as cigarettes and  e-cigarettes. If you need help quitting, ask your health care provider.  If you drink alcohol and your health care provider says it is okay to drink, limit your alcohol intake to no more than 1 drink per day. One drink equals 12 oz of beer, 5 oz of wine, or 1 oz of hard liquor.  Maintain a healthy weight. If you need to lose weight, do it in a way that has been approved by your health care provider. General instructions  Tell all your health care providers about your heart condition, including your dentist. Some medicines can increase your risk of arrhythmia.  Manage other health conditions, such as hypertension and diabetes. These conditions affect your heart.  Learn ways to manage stress.  Get screened for depression, and seek treatment if needed.  Monitor your blood pressure if told by your health care provider.  Keep your vaccinations up to date. Get the annual influenza vaccine.  Keep all follow-up visits as told by your health care provider. This is important. Contact a health care provider if:  You feel overwhelmed or sad.  You have trouble with your daily activities. Get help right away if:  You have pain in your chest, neck, arm, jaw, stomach, or back that recurs, and: ? Lasts more than a few minutes. ? Is not relieved by taking the [place] health care provider prescribed.  You have unexplained: ? Heavy sweating. ? Heartburn or indigestion. ? Shortness of breath. ? Difficulty breathing. ? Nausea or vomiting. ? Fatigue. ? Nervousness or anxiety. ? Weakness. ? Diarrhea. ? Dark stools or blood in the stool.  You have sudden lightheadedness or dizziness.  Your blood pressure is higher than 180/120  You faint.  You feel like hurting yourself or think about taking your own life. These symptoms may represent a serious problem that is an emergency. Do not wait to see if the symptoms will go away. Get medical help right away. Call your local emergency services  (911 in the [place] Do not drive yourself to the clinic or hospital. Summary  Acute coronary syndrome (ACS) is a when there is not enough blood and oxygen being supplied to the heart. ACS can result in chest pain or a heart attack.  Acute coronary syndrome is a medical emergency. If you have any symptoms of this condition, get help right away.  Treatment includes oxygen, medicines, and procedures to open the blocked arteries and restore blood flow. This information is not intended to replace advice given to you by your health care provider. Make sure you discuss any questions you have with your health care provider. Document Released: 08/05/2005 Document Revised: 09/06/2016 Document Reviewed: 09/06/2016 Elsevier Interactive Patient Education  2018 [place].   Heart Failure Heart failure means your heart has trouble pumping blood. This makes it hard for your body to work well. Heart failure is usually a long-term (chronic) condition. You must take good care of yourself and follow your doctor's treatment plan. Follow these instructions at home:  Take your heart medicine as told by your doctor. ? Do not stop taking medicine unless your doctor tells you to. ? Do not skip any dose of medicine. ? Refill your medicines before they run out. ? Take other medicines only as told by your doctor or pharmacist.  Stay active if told by your doctor. The elderly and people  with severe heart failure should talk with a doctor about physical activity.  Eat heart-healthy foods. Choose foods that are without trans fat and are low in saturated fat, cholesterol, and salt (sodium). This includes fresh or frozen fruits and vegetables, fish, lean meats, fat-free or low-fat dairy foods, whole grains, and high-fiber foods. Lentils and dried peas and beans (legumes) are also good choices.  Limit salt if told by your doctor.  Cook in a healthy way. Roast, grill, broil, bake, poach, steam, or stir-fry  foods.  Limit fluids as told by your doctor.  Weigh yourself every morning. Do this after you pee (urinate) and before you eat breakfast. Write down your weight to give to your doctor.  Take your blood pressure and write it down if your doctor tells you to.  Ask your doctor how to check your pulse. Check your pulse as told.  Lose weight if told by your doctor.  Stop smoking or chewing tobacco. Do not use gum or patches that help you quit without your doctor's approval.  Schedule and go to doctor visits as told.  Nonpregnant women should have no more than 1 drink a day. Men should have no more than 2 drinks a day. Talk to your doctor about drinking alcohol.  Stop illegal drug use.  Stay current with shots (immunizations).  Manage your health conditions as told by your doctor.  Learn to manage your stress.  Rest when you are tired.  If it is really hot outside: ? Avoid intense activities. ? Use air conditioning or fans, or get in a cooler place. ? Avoid caffeine and alcohol. ? Wear loose-fitting, lightweight, and light-colored clothing.  If it is really cold outside: ? Avoid intense activities. ? Layer your clothing. ? Wear mittens or gloves, a hat, and a scarf when going outside. ? Avoid alcohol.  Learn about heart failure and get support as needed.  Get help to maintain or improve your quality of life and your ability to care for yourself as needed. Contact a doctor if:  You gain weight quickly.  You are more short of breath than usual.  You cannot do your normal activities.  You tire easily.  You cough more than normal, especially with activity.  You have any or more puffiness (swelling) in areas such as your hands, feet, ankles, or belly (abdomen).  You cannot sleep because it is hard to breathe.  You feel like your heart is beating fast (palpitations).  You get dizzy or light-headed when you stand up. Get help right away if:  You have trouble  breathing.  There is a change in mental status, such as becoming less alert or not being able to focus.  You have chest pain or discomfort.  You faint. This information is not intended to replace advice given to you by your health care provider. Make sure you discuss any questions you have with your health care provider. Document Released: 05/14/2008 Document Revised: 01/11/2016 Document Reviewed: 09/21/2012 Elsevier Interactive Patient Education  2017 [employer].

## 2018-07-31 NOTE — Discharge Summary (Signed)
Discharge Summary    Patient ID: [name] MRN: [id number]; DOB: Sep 26, 1970  Admit date: 07/27/2018 Discharge date: 07/31/2018  Primary Care Provider: [name], MD  Primary Cardiologist: Edward Ruths, MD  Primary Electrophysiologist:  None   Discharge Diagnoses    Active Problems:   DM2 (diabetes mellitus, type 2) ([place])   Diabetic polyneuropathy associated with type 2 diabetes mellitus ([place])   Tobacco abuse   Stage 3 chronic kidney disease ([place])   NSTEMI (non-ST elevated myocardial infarction) ([place])   Respiratory failure with hypoxia ([place])   Acute heart failure ([place])   Hypertensive emergency   Allergies No Known Allergies  Diagnostic Studies/Procedures    LEFT HEART CATH AND CORONARY ANGIOGRAPHY 07/29/18  Conclusion    Prox Cx to Mid Cx lesion is 85% stenosed.  A drug-eluting stent was successfully placed using a STENT SYNERGY DES 2.5X32. Postdilated and tapered fashion from 2.8 mm proximal and up to 3.1 mm in the distal two thirds of the stent.  Post intervention, there is a 0% residual stenosis.  LV end diastolic pressure is severely elevated. 25 mmHg  Mid LAD to Dist LAD lesion is 90% stenosed. Distal LAD small vessel. Not PCI target.  Lat 1st Mrg lesion is 75% stenosed.   SUMMARY  Severe two-vessel disease ? Culprit lesion likely being the mid circumflex treated with a DES stent (Synergy DES 2.5 mm x 32 mm -postdilated to 3.1 mm),  ? There is also diffuse extensive disease in the distal/apical portion of the LAD that is not PCI minimal.    Several other very small branch vessels-diagonal branches, lateral OM's etc. have significant disease, but are too small for PTCA.    Otherwise ectatic RCA and proximal to mid LAD.  Severely elevated LVEDP of 25 mmHg (in the setting of reduced EF) suggestive of ACUTE COMBINED SYSTOLIC AND DIASTOLIC HEART FAILURE  RECOMMENDATIONS  Return to 2 Central Progressive Care unit for ongoing care and TR band  removal per protocol  We will dose Lasix 40 mg IV x3 starting this evening for evidence of acute combined systolic and diastolic heart failure.  Continue other aggressive risk factor modification and cardiomyopathy treatment.  Medical management for LAD and other small vessel disease including nitrate, beta-blocker etc.  Would monitor for least 1 more day following PCI to ensure stable renal function given contrast load with a creatinine of 1.8.  Edward [name], M.D., M.S. Interventional Cardiologist   _____________  ABIs 07/29/18   Right:    Unable to obtain pressures due to previous below the knee amputation.  Left: Resting left ankle-brachial index indicates mild left lower extremity arterial disease.    Unable to obtain toe pressures due to previous transmetatarsal amputation.    __________________________  Echocardiogram 07/28/2018 Study Conclusions  - Left ventricle: The cavity size was mildly dilated. There was   moderate concentric hypertrophy. Systolic function was mildly to   moderately reduced. The estimated ejection fraction was in the   range of 40% to 45%. Mild diffuse hypokinesis with no   identifiable regional variations. Doppler parameters are   consistent with abnormal left ventricular relaxation (grade 1   diastolic dysfunction). Indeterminate mean left atrial filling   pressure. - Aortic valve: There was mild regurgitation directed centrally in   the LVOT. - Mitral valve: There was mild to moderate regurgitation directed   centrally. - Left atrium: The atrium was mildly dilated. - Pericardium, extracardiac: A small pericardial effusion was   identified circumferential to the heart.  History of Present Illness     Edward [name] is a 47 y.o. male with history of hypertension, chronic kidney disease, poorly controlled type 2 diabetes mellitus complicated by peripheral neuropathy, peripheral vascular disease status-post right BKA and left  transmetatarsal amputation, tobacco use, who presented to the ED on 07/28/18 with worsening dyspnea for the past two days and found to have acute hypoxic respiratory failure and NSTEMI.   The patient reports he developed worsening dyspnea over the past 48 hours. Exacerbating factors include laying down flat, moving. He has been unable to sleep due to the shortness of breath. He has not had any chest discomfort. He has had a sensation of having fluid in his airway and has had a cough but no fevers or chills. He has noticed increasing lower extremity edema. No nausea vomiting arm pain or diaphoresis.   On presentation to the emergency room he was found to be hypoxic and placed on BIPAP. His blood pressure was 204/126 and he was placed on nitroglycerine infusion. Chest x-ray revealed bilateral pulmonary edema and he was given furosemide 80 mg IV with 600 cc urine output so far. The patient states that he is feeling better with these interventions.   He was admitted to the hospital with acute hypoxic respiratory failure and NSTEMI.   Hospital Course     Consultants: Diabetes coordinator  Troponins peaked at 3.01 and trended down.  BNP was 436.  The patient was taken for cardiac catheterization on 07/29/2018, found to have severe two-vessel disease with culprit lesion likely being the mid circumflex which was treated with a DES (see above report).  There is also diffuse extensive disease in the distal/apical portion of the LAD that is not PCI amenable.  LVEDP 25 mmHg.  He has been started on aspirin, Brilinta and high intensity statin.  He is continued on Toprol.  He was found to have cardiomyopathy, EF 40-45% by echo, out of proportion to coronary disease.  Likely hypertensive mediated.  Guideline directed medical therapy has been initiated and plan to reassess LV function in 3 months.  Continue beta-blocker, hydralazine/nitrates.  We will consider adding an ARB as an outpatient once it is clear that his  renal function is stable.  He will be discharged on Lasix 40 mg daily, fluid restriction and low-sodium diet.  He has CKD stage III.  Creatinine is mildly increased today compared to yesterday.  Plan to recheck potassium and renal function in 1 week at follow-up appointment.  The patient is instructed to follow-up with primary care for his diabetes.  He continues to smoke and has been counseled on cessation.  Patient has been seen by Dr. Stanford Breed today and deemed ready for discharge home. All follow up appointments have been scheduled. Discharge medications are listed below. _____________  Discharge Vitals Blood pressure (!) 129/100, pulse 93, temperature 98.4 F (36.9 C), temperature source Oral, resp. rate 18, height _0  (1.854 m), weight 96.5 kg, SpO2 97 %.  Filed Weights   07/29/18 0438 07/30/18 0403 07/31/18 0538  Weight: 97.7 kg 97.4 kg 96.5 kg    Labs & Radiologic Studies    CBC Recent Labs    07/30/18 0212 07/31/18 0219  WBC 5.8 6.3  HGB 9.8* 9.7*  HCT 32.2* 30.9*  MCV 84.7 84.2  PLT 297 588   Basic Metabolic Panel Recent Labs    07/30/18 0212 07/31/18 0219  NA 140 140  K 4.0 3.9  CL 108 106  CO2 21* 23  GLUCOSE 136* 126*  BUN 24* 27*  CREATININE 1.78* 2.01*  CALCIUM 8.2* 8.2*   Liver Function Tests Recent Labs    07/31/18 0219  AST 18  ALT 12  ALKPHOS 45  BILITOT 0.4  PROT 5.7*  ALBUMIN 2.7*   No results for input(s): LIPASE, AMYLASE in the last 72 hours. Cardiac Enzymes Recent Labs    07/28/18 2105  TROPONINI 2.41*   BNP Invalid input(s): POCBNP D-Dimer No results for input(s): DDIMER in the last 72 hours. Hemoglobin A1C Recent Labs    07/29/18 0232  HGBA1C 6.7*   Fasting Lipid Panel No results for input(s): CHOL, HDL, LDLCALC, TRIG, CHOLHDL, LDLDIRECT in the last 72 hours. Thyroid Function Tests No results for input(s): TSH, T4TOTAL, T3FREE, THYROIDAB in the last 72 hours.  Invalid input(s): FREET3 _____________  Dg Chest  Port 1 View  Result Date: 07/27/2018 CLINICAL DATA:  Shortness of breath EXAM: PORTABLE CHEST 1 VIEW COMPARISON:  05/07/2016 FINDINGS: Cardiomegaly with vascular congestion, pleural effusions and moderate pulmonary edema. Consolidations at the bases. No pneumothorax. IMPRESSION: 1. Cardiomegaly with vascular congestion and moderate pulmonary edema. There are bilateral pleural effusions. 2. Dense airspace disease at the bases may reflect atelectasis or pneumonia Electronically Signed   By: Donavan Foil M.D.   On: 07/27/2018 23:58   Vas [place] Abi With/wo Tbi  Result Date: 07/29/2018 LOWER EXTREMITY DOPPLER STUDY Indications: Femoral bruit.  Comparison Study: 01/17/17 Right 0.93 L 0.96 Performing Technologist: Carlos Levering Rvt  Examination Guidelines: A complete evaluation includes at minimum, Doppler waveform signals and systolic blood pressure reading at the level of bilateral brachial, anterior tibial, and posterior tibial arteries, when vessel segments are accessible. Bilateral testing is considered an integral part of a complete examination. Photoelectric Plethysmograph (PPG) waveforms and toe systolic pressure readings are included as required and additional duplex testing as needed. Limited examinations for reoccurring indications may be performed as noted.  ABI Findings: +--------+------------------+-----+---------+--------+ Right   Rt Pressure (mmHg)IndexWaveform Comment  +--------+------------------+-----+---------+--------+ [id number]                    triphasic         +--------+------------------+-----+---------+--------+ PTA                                     BKA      +--------+------------------+-----+---------+--------+ DP                                      BKA      +--------+------------------+-----+---------+--------+ +---------+------------------+-----+---------+--------------------------+ Left     Lt Pressure (mmHg)IndexWaveform Comment                     +---------+------------------+-----+---------+--------------------------+ Brachial 145                    triphasic                           +---------+------------------+-----+---------+--------------------------+ PTA      114               0.79 triphasic                           +---------+------------------+-----+---------+--------------------------+ DP       133  0.92 triphasic                           +---------+------------------+-----+---------+--------------------------+ Great Toe                                Transmetatarsal amputation +---------+------------------+-----+---------+--------------------------+ +-------+-----------+-----------+------------+------------+ ABI/TBIToday's ABIToday's TBIPrevious ABIPrevious TBI +-------+-----------+-----------+------------+------------+ Right                        0.93                     +-------+-----------+-----------+------------+------------+ Left   0.92                  0.96                     +-------+-----------+-----------+------------+------------+  Summary: Right: Unable to obtain pressures due to previous below the knee amputation. Left: Resting left ankle-brachial index indicates mild left lower extremity arterial disease. Unable to obtain toe pressures due to previous transmetatarsal amputation.  *See table(s) above for measurements and observations.  Electronically signed by [name] MD on 07/29/2018 at 2:03:51 PM.   Final    Disposition   Pt is being discharged home today in good condition.  Follow-up Plans & Appointments    Follow-up Information    Erlene Quan, PA-C Follow up.   Specialties:  Cardiology, Radiology Why:  Cardiology hospital follow-up on 08/06/2018 at 8:30 AM.  Please arrive at 8:15 AM for check-in. Contact information: [address] [place] [address] [phone]        Lelon Perla, MD Follow up.   Specialty:  Cardiology Why:   Cardiology follow-up with Dr. Stanford Breed on 10/12/2018 at 8:40 AM.  Please arrive 15 minutes early for check-in. Contact information: [address] [place] [address] [phone]        Edward Beard, MD. Schedule an appointment as soon as possible for a visit.   Specialty:  Family Medicine Why:  Please follow-up with your primary care provider for diabetes management. Contact information: [address]  [place] [address] [phone]          Discharge Instructions    (HEART FAILURE PATIENTS) Call MD:  Anytime you have any of the following symptoms: 1) 3 pound weight gain in 24 hours or 5 pounds in 1 week 2) shortness of breath, with or without a dry hacking cough 3) swelling in the hands, feet or stomach 4) if you have to sleep on extra pillows at night in order to breathe.   Complete by:  As directed    AMB Referral to Cardiac Rehabilitation - Phase II   Complete by:  As directed    Diagnosis:   NSTEMI Heart Failure (see criteria below if ordering Phase II) Coronary Stents     Heart Failure Type:  Chronic Systolic & Diastolic   Diet - low sodium heart healthy   Complete by:  As directed    Discharge instructions   Complete by:  As directed    PLEASE REMEMBER TO BRING ALL OF YOUR MEDICATIONS TO EACH OF YOUR FOLLOW-UP OFFICE VISITS.  PLEASE ATTEND ALL SCHEDULED FOLLOW-UP APPOINTMENTS.   Activity: Increase activity slowly as tolerated. You may shower, but no soaking baths (or swimming) for 1 week. No driving for 1 week. No lifting  over 10 lbs for 2 weeks. No sexual activity for 2 weeks.   You May Return to Work: after office follow up  Wound Care: You may wash cath site gently with soap and water. Keep cath site clean and dry. If you notice pain, swelling, bleeding or pus at your cath site, please call [phone].   Increase activity slowly   Complete by:  As directed       Discharge Medications   Allergies as of 07/31/2018   No Known  Allergies     Medication List    STOP taking these medications   metoprolol tartrate 25 MG tablet Commonly known as:  LOPRESSOR     TAKE these medications   acetaminophen 325 MG tablet Commonly known as:  TYLENOL Take 1-2 tablets (325-650 mg total) by mouth every 6 (six) hours as needed for mild pain (pain score 1-3 or temp > 100.5).   aspirin 81 MG EC tablet Take 1 tablet (81 mg total) by mouth daily. Start taking on:  August 01, 2018   atorvastatin 80 MG tablet Commonly known as:  LIPITOR Take 1 tablet (80 mg total) by mouth daily at 6 PM.   blood glucose meter kit and supplies Dispense based on patient and insurance preference. Use up to four times daily as directed. (FOR ICD-9 250.00, 250.01).   furosemide 40 MG tablet Commonly known as:  LASIX Take 1 tablet (40 mg total) by mouth daily. Start taking on:  August 01, 2018   gabapentin 100 MG capsule Commonly known as:  NEURONTIN Take 1 capsule (100 mg total) by mouth at bedtime. What changed:  how much to take   hydrALAZINE 50 MG tablet Commonly known as:  APRESOLINE Take 1 tablet (50 mg total) by mouth every 8 (eight) hours.   insulin aspart protamine - aspart (70-30) 100 UNIT/ML FlexPen Commonly known as:  NOVOLOG MIX 70/30 FLEXPEN Inject 0.15 mLs (15 Units total) into the skin 2 (two) times daily with a meal.   Insulin Pen Needle 31G X 5 MM Misc Use daily to inject insulin as instructed   isosorbide mononitrate 30 MG 24 hr tablet Commonly known as:  IMDUR Take 1 tablet (30 mg total) by mouth daily. Start taking on:  August 01, 2018   methocarbamol 500 MG tablet Commonly known as:  ROBAXIN Take 1 tablet (500 mg total) by mouth every 6 (six) hours as needed for muscle spasms.   metoprolol succinate 50 MG 24 hr tablet Commonly known as:  TOPROL-XL Take 1 tablet (50 mg total) by mouth daily. Take with or immediately following a meal. Start taking on:  August 01, 2018   multivitamin with minerals  Tabs tablet Take 1 tablet by mouth daily. Start taking on:  August 01, 2018   polyethylene glycol packet Commonly known as:  MIRALAX / GLYCOLAX Take 17 g by mouth daily.   senna 8.6 MG Tabs tablet Commonly known as:  SENOKOT Take 1 tablet (8.6 mg total) by mouth at bedtime.   sitaGLIPtin-metformin 50-1000 MG tablet Commonly known as:  JANUMET Take 1 tablet by mouth 2 (two) times daily with a meal. What changed:  when to take this   ticagrelor 90 MG Tabs tablet Commonly known as:  BRILINTA Take 1 tablet (90 mg total) by mouth 2 (two) times daily.   traMADol 50 MG tablet Commonly known as:  ULTRAM Take 50 mg by mouth every 6 (six) hours as needed for moderate pain.  Acute coronary syndrome (MI, NSTEMI, STEMI, etc) this admission?: Yes.     AHA/ACC Clinical Performance & Quality Measures: 1. Aspirin prescribed? - Yes 2. ADP Receptor Inhibitor (Plavix/Clopidogrel, Brilinta/Ticagrelor or Effient/Prasugrel) prescribed (includes medically managed patients)? - Yes 3. Beta Blocker prescribed? - Yes 4. High Intensity Statin (Lipitor 40-82m or Crestor 20-413m prescribed? - Yes 5. EF assessed during THIS hospitalization? - Yes 6. For EF <40%, was ACEI/ARB prescribed? - Not Applicable (EF >/= 4050%7. For EF <40%, Aldosterone Antagonist (Spironolactone or Eplerenone) prescribed? - Not Applicable (EF >/= 4027%8. Cardiac Rehab Phase II ordered (Included Medically managed Patients)? - Yes     Outstanding Labs/Studies   BMet in 1 week at follow up.  Will need FLP and LFTs in 6 weeks to follow statin.   Duration of Discharge Encounter   Greater than 30 minutes including physician time.  Signed, JaDaune PerchNP 07/31/2018, 12:28 PM

## 2018-07-31 NOTE — Research (Signed)
AEGIS II Informed Consent   Subject Name: Edward [name]  Subject met inclusion and exclusion criteria.  The informed consent form, study requirements and expectations were reviewed with the subject and questions and concerns were addressed prior to the signing of the consent form.  The subject verbalized understanding of the trail requirements.  The subject agreed to participate in the AEGIS II trial and signed the informed consent.  The informed consent was obtained prior to performance of any protocol-specific procedures for the subject.  A copy of the signed informed consent was given to the subject and a copy was placed in the subject's medical record.  Hedrick,Tammy W 07/31/2018, 3029

## 2018-07-31 NOTE — Care Management Note (Addendum)
Case Management Note  Patient Details  Name: Edward [name] MRN: [id number] Date of Birth: [birth year]  Subjective/Objective:  Pt admitted with SOB                  Action/Plan:   PTA independent from home with sons.  Pt has PCP and denied barriers with paying for medications.  CM informed pt that Brilinta is on the medicaid preferred list.   CM confirmed with [facility] that prescription can be filled today   Expected Discharge Date:                  Expected Discharge Plan:  Home/Self Care  In-House Referral:     Discharge planning Services  CM Consult  Post Acute Care Choice:    Choice offered to:     DME Arranged:    DME Agency:     HH Arranged:    [facility] Agency:     Status of Service:  Completed, signed off  If discussed at [employer] of Stay Meetings, dates discussed:    Additional Comments:  Maryclare Labrador, RN 07/31/2018, 11:53 AM

## 2018-07-31 NOTE — Progress Notes (Signed)
Progress Note  Patient Name: Edward [name] Date of Encounter: 07/31/2018  Primary Cardiologist: Kirk Ruths, MD   Subjective   Denies CP or dyspnea  Inpatient Medications    Scheduled Meds: . AEGIS II (PI-Hilty)  170 mL Intravenous Q7 days  . aspirin EC  81 mg Oral Daily  . atorvastatin  80 mg Oral q1800  . enoxaparin (LOVENOX) injection  30 mg Subcutaneous Q24H  . furosemide  40 mg Intravenous BID  . gabapentin  100 mg Oral QHS  . hydrALAZINE  50 mg Oral Q8H  . insulin aspart  0-5 Units Subcutaneous QHS  . insulin aspart  0-9 Units Subcutaneous TID WC  . insulin aspart  6 Units Subcutaneous TID WC  . insulin glargine  20 Units Subcutaneous Daily  . isosorbide mononitrate  30 mg Oral Daily  . metoprolol succinate  50 mg Oral Daily  . multivitamin with minerals  1 tablet Oral Daily  . polyethylene glycol  17 g Oral Daily  . senna  1 tablet Oral QHS  . sodium chloride flush  3 mL Intravenous Q12H  . sodium chloride flush  3 mL Intravenous Q12H  . ticagrelor  90 mg Oral BID   Continuous Infusions: . sodium chloride    . sodium chloride     PRN Meds: sodium chloride, sodium chloride, acetaminophen, ALPRAZolam, ondansetron (ZOFRAN) IV, sodium chloride flush, sodium chloride flush, traMADol   Vital Signs    Vitals:   07/31/18 0007 07/31/18 0424 07/31/18 0538 07/31/18 0806  BP: 106/90 (!) 134/92  (!) 129/100  Pulse: 89 85  93  Resp: (!) 21 13  18   Temp: 98.2 F (36.8 C) 98.1 F (36.7 C)  98.4 F (36.9 C)  TempSrc: Oral Oral  Oral  SpO2: 97% 98%  97%  Weight:   96.5 kg   Height:        Intake/Output Summary (Last 24 hours) at 07/31/2018 0942 Last data filed at 07/31/2018 5361 Gross per 24 hour  Intake 1063 ml  Output 2726 ml  Net -1663 ml   Filed Weights   07/29/18 0438 07/30/18 0403 07/31/18 0538  Weight: 97.7 kg 97.4 kg 96.5 kg    Telemetry    Sinus with nonconducted PAC - Personally Reviewed  Physical Exam   GEN: NAD Neck:  Supple Cardiac: RRR, no murmur Respiratory: CTA; no wheeze GI: Soft, NT/ND, no masses MS: No edema, status post amputation right lower extremity and metatarsal amputation left lower ext Neuro:  No focal findings   Labs    Chemistry Recent Labs  Lab 07/29/18 0232 07/30/18 0212 07/31/18 0219  NA 138 140 140  K 3.8 4.0 3.9  CL 108 108 106  CO2 23 21* 23  GLUCOSE 136* 136* 126*  BUN 28* 24* 27*  CREATININE 1.82* 1.78* 2.01*  CALCIUM 8.2* 8.2* 8.2*  PROT  --   --  5.7*  ALBUMIN  --   --  2.7*  AST  --   --  18  ALT  --   --  12  ALKPHOS  --   --  45  BILITOT  --   --  0.4  GFRNONAA NOT CALCULATED 44* 38*  GFRAA NOT CALCULATED 51* 44*  ANIONGAP 7 11 11      Hematology Recent Labs  Lab 07/29/18 0232 07/30/18 0212 07/31/18 0219  WBC 7.3 5.8 6.3  RBC 3.82* 3.80* 3.67*  HGB 10.1* 9.8* 9.7*  HCT 32.1* 32.2* 30.9*  MCV 84.0 84.7 84.2  MCH 26.4 25.8* 26.4  MCHC 31.5 30.4 31.4  RDW 14.6 14.9 15.0  PLT 265 297 283    Cardiac Enzymes Recent Labs  Lab 07/28/18 0019 07/28/18 0721 07/28/18 1139 07/28/18 2105  TROPONINI 3.01* 2.89* 2.82* 2.41*    BNP Recent Labs  Lab 07/28/18 0019  BNP 436.7*     Radiology    Vas [place] Abi With/wo Tbi  Result Date: 07/29/2018 LOWER EXTREMITY DOPPLER STUDY Indications: Femoral bruit.  Comparison Study: 01/17/17 Right 0.93 L 0.96 Performing Technologist: Carlos Levering Rvt  Examination Guidelines: A complete evaluation includes at minimum, Doppler waveform signals and systolic blood pressure reading at the level of bilateral brachial, anterior tibial, and posterior tibial arteries, when vessel segments are accessible. Bilateral testing is considered an integral part of a complete examination. Photoelectric Plethysmograph (PPG) waveforms and toe systolic pressure readings are included as required and additional duplex testing as needed. Limited examinations for reoccurring indications may be performed as noted.  ABI Findings:  +--------+------------------+-----+---------+--------+ Right   Rt Pressure (mmHg)IndexWaveform Comment  +--------+------------------+-----+---------+--------+ [id number]                    triphasic         +--------+------------------+-----+---------+--------+ PTA                                     BKA      +--------+------------------+-----+---------+--------+ DP                                      BKA      +--------+------------------+-----+---------+--------+ +---------+------------------+-----+---------+--------------------------+ Left     Lt Pressure (mmHg)IndexWaveform Comment                    +---------+------------------+-----+---------+--------------------------+ Brachial 145                    triphasic                           +---------+------------------+-----+---------+--------------------------+ PTA      114               0.79 triphasic                           +---------+------------------+-----+---------+--------------------------+ DP       133               0.92 triphasic                           +---------+------------------+-----+---------+--------------------------+ Great Toe                                Transmetatarsal amputation +---------+------------------+-----+---------+--------------------------+ +-------+-----------+-----------+------------+------------+ ABI/TBIToday's ABIToday's TBIPrevious ABIPrevious TBI +-------+-----------+-----------+------------+------------+ Right                        0.93                     +-------+-----------+-----------+------------+------------+ Left   0.92                  0.96                     +-------+-----------+-----------+------------+------------+  Summary: Right: Unable to obtain pressures due to previous below the knee amputation. Left: Resting left ankle-brachial index indicates mild left lower extremity arterial disease. Unable to obtain toe pressures due to  previous transmetatarsal amputation.  *See table(s) above for measurements and observations.  Electronically signed by [name] MD on 07/29/2018 at 2:03:51 PM.   Final     Patient Profile     47 y.o. male with past medical history of diabetes mellitus, peripheral vascular disease with prior right BKA and left metatarsal amputation, tobacco abuse, hypertension, chronic stage III kidney disease admitted with congestive heart failure and non-ST elevation myocardial infarction.  Echocardiogram shows ejection fraction 40 to 45%, moderate left ventricular hypertrophy, mild left ventricular enlargement, mild aortic insufficiency, mild to moderate mitral regurgitation, mild left atrial enlargement and small pericardial effusion.  Assessment & Plan    1 non-ST elevation myocardial infarction-patient remains pain-free.  Patient is status post PCI of left circumflex.  Plan to continue aspirin, Brilinta and statin.  Continue Toprol.   2 acute combined systolic/diastolic congestive heart failure-volume status improved.  Will change Lasix to 40 mg by mouth daily.  Needs fluid restriction and low-sodium diet at home.  3 cardiomyopathy-etiology unclear.  Cardiomyopathy appears out of proportion to coronary disease.  Likely hypertensive mediated.  We will titrate medications and plan to reassess LV function in 3 months.  Continue beta-blocker, hydralazine/nitrates.  We will consider adding an ARB as an outpatient once it is clear renal function is stable.  Echocardiogram shows ejection fraction 40 to 45%.    4 hypertension-blood pressure has improved.  We will follow as an outpatient and adjust regimen as needed.  5 chronic stage III kidney disease-creatinine mildly increased today compared to yesterday.  Will check potassium and renal function 1 week following discharge.  6 diabetes mellitus-followup primary care after discharge.  7 tobacco abuse-patient counseled on discontinuing.  Discharge today.   Follow-up APP with bmet 1 week.  Follow-up with me 12 weeks. Greater than 30 minutes PA and physician time. D2  For questions or updates, please contact [place] Please consult [email or web address] for contact info under        Signed, Kirk Ruths, MD  07/31/2018, 9:42 AM

## 2018-07-31 NOTE — Progress Notes (Signed)
Removed PIV access x 2 and pt received discharge instructions with prescriptions. Pt understood well, he refused home care. He took his all belongings. HS [employer]

## 2018-07-31 NOTE — Progress Notes (Signed)
   Patient ID: Edward [name], male    DOB: [birth year], 47 y.o.   MRN: [id number]  Patient seen yesterday and today, and patient examined.  Findings noted and reviewed.  EF noted.  Patient currently stable.  Patient discussed with Dr. Stanford Breed.    Aeg[place] II trial explained to the patient and family.  Patient wishes to proceed after discussion and review of materials.    VSS as noted.  Lung fields clear.  Cardiac exam regular Prior BKA and metatarsal amputation noted.    [place] Class I at present.   Patient randomized by staff and infusion begun.  Edward [name]. Lia Foyer, MD, [place] Director, [facility]

## 2018-07-31 NOTE — Telephone Encounter (Signed)
Currently admitted.

## 2018-07-31 NOTE — Telephone Encounter (Signed)
New Message   Patient has a TOC appt with Kerin Ransom 08/06/2018.

## 2018-08-04 ENCOUNTER — Telehealth (HOSPITAL_COMMUNITY): Payer: Self-pay

## 2018-08-04 ENCOUNTER — Encounter (HOSPITAL_COMMUNITY): Payer: Self-pay | Admitting: *Deleted

## 2018-08-04 ENCOUNTER — Other Ambulatory Visit: Payer: Self-pay

## 2018-08-04 ENCOUNTER — Emergency Department (HOSPITAL_COMMUNITY): Payer: Medicaid Other

## 2018-08-04 ENCOUNTER — Emergency Department (HOSPITAL_COMMUNITY)
Admission: EM | Admit: 2018-08-04 | Discharge: 2018-08-04 | Disposition: A | Discharge status: Home / Self Care | Payer: Medicaid Other | Attending: Emergency Medicine | Admitting: Emergency Medicine

## 2018-08-04 DIAGNOSIS — R0600 Dyspnea, unspecified: Secondary | ICD-10-CM | POA: Insufficient documentation

## 2018-08-04 DIAGNOSIS — Z79899 Other long term (current) drug therapy: Secondary | ICD-10-CM | POA: Insufficient documentation

## 2018-08-04 DIAGNOSIS — Z96642 Presence of left artificial hip joint: Secondary | ICD-10-CM | POA: Insufficient documentation

## 2018-08-04 DIAGNOSIS — F329 Major depressive disorder, single episode, unspecified: Secondary | ICD-10-CM | POA: Insufficient documentation

## 2018-08-04 DIAGNOSIS — I252 Old myocardial infarction: Secondary | ICD-10-CM | POA: Diagnosis not present

## 2018-08-04 DIAGNOSIS — I5023 Acute on chronic systolic (congestive) heart failure: Secondary | ICD-10-CM

## 2018-08-04 DIAGNOSIS — I2510 Atherosclerotic heart disease of native coronary artery without angina pectoris: Secondary | ICD-10-CM | POA: Diagnosis not present

## 2018-08-04 DIAGNOSIS — Z794 Long term (current) use of insulin: Secondary | ICD-10-CM | POA: Diagnosis not present

## 2018-08-04 DIAGNOSIS — N183 Chronic kidney disease, stage 3 (moderate): Secondary | ICD-10-CM | POA: Insufficient documentation

## 2018-08-04 DIAGNOSIS — F419 Anxiety disorder, unspecified: Secondary | ICD-10-CM | POA: Insufficient documentation

## 2018-08-04 DIAGNOSIS — F17210 Nicotine dependence, cigarettes, uncomplicated: Secondary | ICD-10-CM | POA: Diagnosis not present

## 2018-08-04 DIAGNOSIS — E1122 Type 2 diabetes mellitus with diabetic chronic kidney disease: Secondary | ICD-10-CM | POA: Diagnosis not present

## 2018-08-04 DIAGNOSIS — I5042 Chronic combined systolic (congestive) and diastolic (congestive) heart failure: Secondary | ICD-10-CM | POA: Insufficient documentation

## 2018-08-04 DIAGNOSIS — R0602 Shortness of breath: Secondary | ICD-10-CM | POA: Insufficient documentation

## 2018-08-04 DIAGNOSIS — I5043 Acute on chronic combined systolic (congestive) and diastolic (congestive) heart failure: Secondary | ICD-10-CM | POA: Insufficient documentation

## 2018-08-04 DIAGNOSIS — I509 Heart failure, unspecified: Secondary | ICD-10-CM | POA: Diagnosis not present

## 2018-08-04 DIAGNOSIS — I130 Hypertensive heart and chronic kidney disease with heart failure and stage 1 through stage 4 chronic kidney disease, or unspecified chronic kidney disease: Secondary | ICD-10-CM | POA: Diagnosis not present

## 2018-08-04 DIAGNOSIS — Z7982 Long term (current) use of aspirin: Secondary | ICD-10-CM | POA: Diagnosis not present

## 2018-08-04 HISTORY — DX: Heart failure, unspecified: I50.9

## 2018-08-04 LAB — CBC WITH DIFFERENTIAL/PLATELET
Abs Immature Granulocytes: 0.03 10*3/uL (ref 0.00–0.07)
Basophils Absolute: 0 10*3/uL (ref 0.0–0.1)
Basophils Relative: 1 %
Eosinophils Absolute: 0.5 10*3/uL (ref 0.0–0.5)
Eosinophils Relative: 8 %
HCT: 36.2 % — ABNORMAL LOW (ref 39.0–52.0)
Hemoglobin: 10.9 g/dL — ABNORMAL LOW (ref 13.0–17.0)
IMMATURE GRANULOCYTES: 1 %
Lymphocytes Relative: 14 %
Lymphs Abs: 0.9 10*3/uL (ref 0.7–4.0)
MCH: 26.3 pg (ref 26.0–34.0)
MCHC: 30.1 g/dL (ref 30.0–36.0)
MCV: 87.2 fL (ref 80.0–100.0)
Monocytes Absolute: 0.5 10*3/uL (ref 0.1–1.0)
Monocytes Relative: 8 %
NEUTROS PCT: 68 %
Neutro Abs: 4.4 10*3/uL (ref 1.7–7.7)
Platelets: 338 10*3/uL (ref 150–400)
RBC: 4.15 MIL/uL — AB (ref 4.22–5.81)
RDW: 14.3 % (ref 11.5–15.5)
WBC: 6.3 10*3/uL (ref 4.0–10.5)
nRBC: 0 % (ref 0.0–0.2)

## 2018-08-04 LAB — COMPREHENSIVE METABOLIC PANEL
ALT: 20 U/L (ref 0–44)
AST: 16 U/L (ref 15–41)
Albumin: 3.3 g/dL — ABNORMAL LOW (ref 3.5–5.0)
Alkaline Phosphatase: 53 U/L (ref 38–126)
Anion gap: 12 (ref 5–15)
BUN: 29 mg/dL — ABNORMAL HIGH (ref 6–20)
CO2: 19 mmol/L — ABNORMAL LOW (ref 22–32)
Calcium: 8.7 mg/dL — ABNORMAL LOW (ref 8.9–10.3)
Chloride: 104 mmol/L (ref 98–111)
Creatinine, Ser: 2.01 mg/dL — ABNORMAL HIGH (ref 0.61–1.24)
GFR calc Af Amer: 44 mL/min — ABNORMAL LOW (ref >60)
GFR calc non Af Amer: 38 mL/min — ABNORMAL LOW (ref >60)
Glucose, Bld: 186 mg/dL — ABNORMAL HIGH (ref 70–99)
Potassium: 4.7 mmol/L (ref 3.5–5.1)
Sodium: 135 mmol/L (ref 135–145)
Total Bilirubin: 1 mg/dL (ref 0.3–1.2)
Total Protein: 6.6 g/dL (ref 6.5–8.1)

## 2018-08-04 LAB — BRAIN NATRIURETIC PEPTIDE: B Natriuretic Peptide: 552.4 pg/mL — ABNORMAL HIGH (ref 0.0–100.0)

## 2018-08-04 LAB — I-STAT TROPONIN, ED: TROPONIN I, POC: 0.57 ng/mL — AB (ref 0.00–0.08)

## 2018-08-04 LAB — PROTIME-INR
INR: 1.04
Prothrombin Time: 13.5 seconds (ref 11.4–15.2)

## 2018-08-04 MED ORDER — ALPRAZOLAM 0.25 MG PO TABS
0.2500 mg | ORAL_TABLET | Freq: Three times a day (TID) | ORAL | Status: DC | PRN
  Administered 2018-08-04: 0.25 mg via ORAL
  Filled 2018-08-04: qty 1

## 2018-08-04 MED ORDER — ALPRAZOLAM 0.25 MG PO TABS: 0.2500 mg | ORAL_TABLET | Freq: Three times a day (TID) | ORAL | 0 refills | Status: DC | PRN

## 2018-08-04 MED ORDER — FUROSEMIDE 10 MG/ML IJ SOLN
40.0000 mg | Freq: Once | INTRAMUSCULAR | Status: AC
  Administered 2018-08-04: 40 mg via INTRAVENOUS
  Filled 2018-08-04: qty 4

## 2018-08-04 MED FILL — ALPRAZolam 0.25 MG TABS: 0.25 | 10 days supply | Qty: 30 | Fill #0

## 2018-08-04 NOTE — ED Triage Notes (Signed)
Pt states he just got out of hospital and just started having shortness of breath last night.  Pt states recent diagnosis of heart failure.  Pt is sob at rest.  Speaking in full sentences. Swelling to LE in the left LE

## 2018-08-04 NOTE — Consult Note (Addendum)
Cardiology Consult Note:   Patient ID: Edward [name] MRN: [id number]; DOB: 06-07-1971   Admission date: 08/04/2018  Primary Care Provider: [name], MD Primary Cardiologist: Kirk Ruths, MD  Primary Electrophysiologist:  None   Chief Complaint:  Dyspnea  Patient Profile:   Edward [name] is a 47 y.o. male with hypertension, chronic kidney disease, poorly controlled type 2 diabetes mellitus complicated by peripheral neuropathy, peripheral vascular disease status-post right BKA and left transmetatarsal amputation, tobacco use and recent admission 12/9-12/13 for acute CHF and NSTEMI, found to have obstructive CAD treated w/ PCI as well as mild-moderately reduced LVEF at 40-45%, presenting back to the [facility] ED, 4 days after discharge w/ CC of dyspnea. Cardiology consulted at the request of Dr. Francia Greaves, Emergency Medicine.   History of Present Illness:   Edward [name] presents back to the [facility] ED w/ CC fo dyspnea, after being discharged 4 days ago. He was admitted 12/9-12/13 for CHF and NSTEMI.  To summarize, heis a 47 y.o.malewith history ofhypertension, chronic kidney disease, poorly controlled type 2 diabetes mellitus complicated by peripheral neuropathy, peripheral vascular disease status-post right BKA and left transmetatarsal amputation, tobacco use, who initially presented to the ED on 07/28/18 with worsening dyspnea for the past two days and found to have acute hypoxic respiratory failure and NSTEMI. On presentation to the emergency room he was found to be hypoxic and placed on BIPAP. His blood pressure was 204/126 and he was placed on nitroglycerine infusion. Chest x-ray revealed bilateral pulmonary edema and he was given furosemide 80 mg IV and was admitted. Troponins peaked at 3.0. BNP was 436.  He was taken for cardiac catheterization on 07/29/2018, found to have severe two-vessel disease with culprit lesion likely being the mid circumflex which was treated with a DES (see above  report).  There was also diffuse extensive disease in the distal/apical portion of the LAD that is not PCI amenable.  LVEDP 25 mmHg.  He wasstarted on aspirin, Brilinta and high intensity statin and continued on Toprol.  He was also found to have cardiomyopathy, EF 40-45% by echo, out of proportion to coronary disease.  Felt likely hypertensive mediated.  Guidelines directed medical therapy was initiated and with plans to reassess LV function in 3 months. Hydralazine/nitrates were added to regimen. No ACE/ARB due to renal function. He developed an AKI. His Scr had increased from 1.69 to 2.10 (Baseline ~1.7). Lasix 40 mg daily was also prescribed with plans to get a repeat BMP at hospital f/u.   He was discharged on 07/31/18. His discharge weight was 212 lb (96.5 Kg). Scr was 2.01. Pt elected to enroll in a research study ([employer]).   He reports that he was doing well initially, until Sunday when he started to feel bouts of dyspnea. Worse at night. Hard to sleep. He tries to lay down but feels more SOB. He gets panicky and has to sit up. Subsequently poor sleep since discharge. He feels like he is having panic attacks. He reports he felt calm when he was admitted last week b/c he was getting xanax. He denies CP. He has not been able to check his weight at home. Currently w/o a prosthetic leg. Plans to get fitted for one today. Does not appear volume overloaded on exam.   EKG shows NSR with no acute ST changes. No significant change in EKG last admission. POC troponin is elevated at 0.57, but this may be residual from recent NSTEMI. His troponin previously admission last week  peaked at 3.01. his renal function is unchanged. His SCr is same as discharge creatine at 2.01. Hgb is trending upward at 10.9 (9 range last week). His BNP however is slightly higher than prior admit at 552 (436.7 last week). His CXR today shows cardiomegaly, small left pleural effusion, left base atelectasis but no frank edema or  consolidation.    Past Medical History:  Diagnosis Date  . Anxiety    self reported  . Arthritis   . CHF (congestive heart failure) ([place])   . Coronary artery disease   . Depression    self reported  . Diabetes mellitus   . Hypertension     Past Surgical History:  Procedure Laterality Date  . AMPUTATION Left 05/10/2016   Procedure: Left Transmetatarsal Amputation;  Surgeon: [name], MD;  Location: [place];  Service: Orthopedics;  Laterality: Left;  . AMPUTATION Right 05/16/2018   Procedure: PARTIAL RAY AMPUTATION FIFTH TOE RIGHT FOOT;  Surgeon: [name], DPM;  Location: [place];  Service: Podiatry;  Laterality: Right;  . AMPUTATION Right 06/17/2018   Procedure: AMPUTATION 4th RAY Right Foot;  Surgeon: Trula Slade, DPM;  Location: [place];  Service: Podiatry;  Laterality: Right;  . AMPUTATION Right 06/19/2018   Procedure: RIGHT BELOW KNEE AMPUTATION;  Surgeon: [name], MD;  Location: [place];  Service: Orthopedics;  Laterality: Right;  . APPLICATION OF WOUND VAC Right 06/19/2018   Procedure: APPLICATION OF WOUND VAC;  Surgeon: [name], MD;  Location: [place];  Service: Orthopedics;  Laterality: Right;  . CARDIAC CATHETERIZATION  07/29/2018  . CIRCUMCISION  09/02/2011   Procedure: CIRCUMCISION ADULT;  Surgeon: Molli Hazard, MD;  Location: [place];  Service: Urology;  Laterality: N/A;  . CORONARY STENT INTERVENTION N/A 07/29/2018   Procedure: CORONARY STENT INTERVENTION;  Surgeon: Leonie Man, MD;  Location: [place] [facility];  Service: Cardiovascular;  Laterality: N/A;  . I&D EXTREMITY  09/02/2011   Procedure: IRRIGATION AND DEBRIDEMENT EXTREMITY;  Surgeon: Theodoro Kos, DO;  Location: [place];  Service: Plastics;  Laterality: N/A;  . INCISION AND DRAINAGE OF WOUND  08/12/2011   Procedure: IRRIGATION AND DEBRIDEMENT WOUND;  Surgeon: Zenovia Jarred, MD;  Location: [place];  Service: General;;  perineum  . IRRIGATION  AND DEBRIDEMENT ABSCESS  08/12/2011   Procedure: IRRIGATION AND DEBRIDEMENT ABSCESS;  Surgeon: Molli Hazard, MD;  Location: [place];  Service: Urology;  Laterality: N/A;  irrigation and debridment perineum    . IRRIGATION AND DEBRIDEMENT ABSCESS  08/14/2011   Procedure: MINOR INCISION AND DRAINAGE OF ABSCESS;  Surgeon: Molli Hazard, MD;  Location: [place];  Service: Urology;  Laterality: N/A;  Perineal Wound Debridement;Placement Bilateral Testicular Thigh Pouches  . LEFT HEART CATH AND CORONARY ANGIOGRAPHY N/A 07/29/2018   Procedure: LEFT HEART CATH AND CORONARY ANGIOGRAPHY;  Surgeon: Leonie Man, MD;  Location: [place] [facility];  Service: Cardiovascular;  Laterality: N/A;  . TOTAL HIP ARTHROPLASTY Left 10/11/2016   Procedure: LEFT TOTAL HIP ARTHROPLASTY ANTERIOR APPROACH;  Surgeon: Mcarthur Rossetti, MD;  Location: [facility];  Service: Orthopedics;  Laterality: Left;     Medications Prior to Admission: Prior to Admission medications   Medication Sig Start Date End Date Taking? Authorizing Provider  aspirin EC 81 MG EC tablet Take 1 tablet (81 mg total) by mouth daily. 08/01/18  Yes Daune Perch, NP  atorvastatin (LIPITOR) 80 MG tablet Take 1 tablet (80 mg total)  by mouth daily at 6 PM. 07/31/18  Yes Daune Perch, NP  docusate sodium (COLACE) 100 MG capsule Take 100 mg by mouth as needed for mild constipation.   Yes [provider]  furosemide (LASIX) 40 MG tablet Take 1 tablet (40 mg total) by mouth daily. 08/01/18  Yes Daune Perch, NP  gabapentin (NEURONTIN) 100 MG capsule Take 1 capsule (100 mg total) by mouth at bedtime. 07/15/18  Yes [name], MD  hydrALAZINE (APRESOLINE) 50 MG tablet Take 1 tablet (50 mg total) by mouth every 8 (eight) hours. 07/31/18  Yes Daune Perch, NP  insulin aspart protamine - aspart (NOVOLOG MIX 70/30 FLEXPEN) (70-30) 100 UNIT/ML FlexPen Inject 0.15 mLs (15 Units total) into the skin 2 (two) times daily with a  meal. 07/01/18  Yes Angiulli, Lavon Paganini, PA-C  isosorbide mononitrate (IMDUR) 30 MG 24 hr tablet Take 1 tablet (30 mg total) by mouth daily. 08/01/18  Yes Daune Perch, NP  methocarbamol (ROBAXIN) 500 MG tablet Take 1 tablet (500 mg total) by mouth every 6 (six) hours as needed for muscle spasms. 07/01/18  Yes Angiulli, Lavon Paganini, PA-C  metoprolol succinate (TOPROL-XL) 50 MG 24 hr tablet Take 1 tablet (50 mg total) by mouth daily. Take with or immediately following a meal. 08/01/18  Yes Daune Perch, NP  Multiple Vitamin (MULTIVITAMIN WITH MINERALS) TABS tablet Take 1 tablet by mouth daily. 08/01/18  Yes Daune Perch, NP  sitaGLIPtin-metformin (JANUMET) 50-1000 MG tablet Take 1 tablet by mouth 2 (two) times daily with a meal. Patient taking differently: Take 1 tablet by mouth daily.  07/01/18  Yes Angiulli, Lavon Paganini, PA-C  ticagrelor (BRILINTA) 90 MG TABS tablet Take 1 tablet (90 mg total) by mouth 2 (two) times daily. 07/31/18 01/27/19 Yes Daune Perch, NP  traMADol (ULTRAM) 50 MG tablet Take 50 mg by mouth every 6 (six) hours as needed for moderate pain.   Yes [provider]  acetaminophen (TYLENOL) 325 MG tablet Take 1-2 tablets (325-650 mg total) by mouth every 6 (six) hours as needed for mild pain (pain score 1-3 or temp > 100.5). Patient not taking: Reported on 08/04/2018 07/01/18   Angiulli, Lavon Paganini, PA-C  blood glucose meter kit and supplies Dispense based on patient and insurance preference. Use up to four times daily as directed. (FOR ICD-9 250.00, 250.01). 07/01/18   Angiulli, Lavon Paganini, PA-C  Insulin Pen Needle 31G X 5 MM MISC Use daily to inject insulin as instructed 07/01/18   Angiulli, Lavon Paganini, PA-C  polyethylene glycol (MIRALAX / GLYCOLAX) packet Take 17 g by mouth daily. Patient not taking: Reported on 08/04/2018 07/01/18   Angiulli, Lavon Paganini, PA-C  senna (SENOKOT) 8.6 MG TABS tablet Take 1 tablet (8.6 mg total) by mouth at bedtime. Patient not taking: Reported on  08/04/2018 07/01/18   Angiulli, Lavon Paganini, PA-C     Allergies:   No Known Allergies  Social History:   Social History   Socioeconomic History  . Marital status: Widowed    Spouse name: Not on file  . Number of children: Not on file  . Years of education: Not on file  . Highest education level: Not on file  Occupational History  . Occupation: Disabled  Social Needs  . Financial resource strain: Not on file  . Food insecurity:    Worry: Not on file    Inability: Not on file  . Transportation needs:    Medical: Not on file    Non-medical: Not on file  Tobacco Use  . Smoking status: Current Every Day Smoker    Types: Cigarettes  . Smokeless tobacco: Never Used  Substance and Sexual Activity  . Alcohol use: Yes    Alcohol/week: 0.0 standard drinks    Comment: has not had alcohol in a month  . Drug use: No  . Sexual activity: Not on file  Lifestyle  . Physical activity:    Days per week: Not on file    Minutes per session: Not on file  . Stress: Not on file  Relationships  . Social connections:    Talks on phone: Not on file    Gets together: Not on file    Attends religious service: Not on file    Active member of club or organization: Not on file    Attends meetings of clubs or organizations: Not on file    Relationship status: Not on file  . Intimate partner violence:    Fear of current or ex partner: Not on file    Emotionally abused: Not on file    Physically abused: Not on file    Forced sexual activity: Not on file  Other Topics Concern  . Not on file  Social History Narrative   Lives w/ his 2 sons.    Family History:   The patient's family history includes Diabetes in an other family member.    ROS:  Please see the history of present illness.  All other ROS reviewed and negative.     Physical Exam/Data:   Vitals:   08/04/18 0827 08/04/18 0830 08/04/18 0950  BP: 130/83 120/88 118/86  Pulse: 77 74 76  Resp: (!) 24 19 (!) 21  Temp: 97.7 F (36.5  C)    TempSrc: Oral    SpO2: 99% 100% 96%   No intake or output data in the 24 hours ending 08/04/18 1023 There were no vitals filed for this visit. There is no height or weight on file to calculate BMI.  General:  Well nourished, well developed, in no acute distress HEENT: normal Lymph: no adenopathy Neck: noJVD Endocrine:  No thryomegaly Vascular: No carotid bruits; FA pulses 2+ bilaterally without bruits  Cardiac:  normal S1, S2; RRR; no murmur Lungs:  clear to auscultation bilaterally, no wheezing, rhonchi or rales  Abd: soft, nontender, no hepatomegaly  Ext: s/p right BKA. No edema Musculoskeletal:  No deformities, BUE and BLE strength normal and equal Skin: warm and dry  Neuro:  CNs 2-12 intact, no focal abnormalities noted Psych:  Normal affect    EKG:  The ECG that was done 08/04/18 was personally reviewed and demonstrates NSR. No acute ST changes and no significant change from prior EKG 07/30/18.   Relevant CV Studies: LEFT HEART CATH AND CORONARY ANGIOGRAPHY 07/29/18  Conclusion    Prox Cx to Mid Cx lesion is 85% stenosed.  A drug-eluting stent was successfully placed using a STENT SYNERGY DES 2.5X32. Postdilated and tapered fashion from 2.8 mm proximal and up to 3.1 mm in the distal two thirds of the stent.  Post intervention, there is a 0% residual stenosis.  LV end diastolic pressure is severely elevated. 25 mmHg  Mid LAD to Dist LAD lesion is 90% stenosed. Distal LAD small vessel. Not PCI target.  Lat 1st Mrg lesion is 75% stenosed.  SUMMARY  Severe two-vessel disease ? Culprit lesion likely being the mid circumflex treated with a DES stent (Synergy DES 2.5 mm x 32 mm -postdilated to 3.1 mm),  ? There  is also diffuse extensive disease in the distal/apical portion of the LAD that is not PCI minimal.   Several other very small branch vessels-diagonal branches, lateral OM's etc. have significant disease, but are too small for PTCA.   Otherwise ectatic  RCA and proximal to mid LAD.  Severely elevated LVEDP of 25 mmHg (in the setting of reduced EF) suggestive of ACUTE COMBINED SYSTOLIC AND DIASTOLIC HEART FAILURE  RECOMMENDATIONS  Return to [facility] unit for ongoing care and TR band removal per protocol  We will dose Lasix 40 mg IV x3 starting this evening for evidence of acute combined systolic and diastolic heart failure.  Continue other aggressive risk factor modification and cardiomyopathy treatment.  Medical management for LAD and other small vessel disease including nitrate, beta-blocker etc.  Would monitor for least 1 more day following PCI to ensure stable renal function given contrast load with a creatinine of 1.8.  Glenetta Hew, M.D., M.S. Interventional Cardiologist   _____________  ABIs 07/29/18   Right:    Unable to obtain pressures due to previous below the knee amputation.  Left: Resting left ankle-brachial index indicates mild left lower extremity arterial disease.    Unable to obtain toe pressures due to previous transmetatarsal amputation.    __________________________  Echocardiogram 07/28/2018 Study Conclusions  - Left ventricle: The cavity size was mildly dilated. There was moderate concentric hypertrophy. Systolic function was mildly to moderately reduced. The estimated ejection fraction was in the range of 40% to 45%. Mild diffuse hypokinesis with no identifiable regional variations. Doppler parameters are consistent with abnormal left ventricular relaxation (grade 1 diastolic dysfunction). Indeterminate mean left atrial filling pressure. - Aortic valve: There was mild regurgitation directed centrally in the LVOT. - Mitral valve: There was mild to moderate regurgitation directed centrally. - Left atrium: The atrium was mildly dilated. - Pericardium, extracardiac: A small pericardial effusion was identified circumferential to the  heart.   Laboratory Data:  Chemistry Recent Labs  Lab 07/31/18 0219 08/04/18 0845  NA 140 135  K 3.9 4.7  CL 106 104  CO2 23 19*  GLUCOSE 126* 186*  BUN 27* 29*  CREATININE 2.01* 2.01*  CALCIUM 8.2* 8.7*  GFRNONAA 38* 38*  GFRAA 44* 44*  ANIONGAP 11 12    Recent Labs  Lab 07/31/18 0219 08/04/18 0845  PROT 5.7* 6.6  ALBUMIN 2.7* 3.3*  AST 18 16  ALT 12 20  ALKPHOS 45 53  BILITOT 0.4 1.0   Hematology Recent Labs  Lab 07/31/18 0219 08/04/18 0845  WBC 6.3 6.3  RBC 3.67* 4.15*  HGB 9.7* 10.9*  HCT 30.9* 36.2*  MCV 84.2 87.2  MCH 26.4 26.3  MCHC 31.4 30.1  RDW 15.0 14.3  PLT 283 338   Cardiac Enzymes Recent Labs  Lab 07/28/18 1139 07/28/18 2105  TROPONINI 2.82* 2.41*    Recent Labs  Lab 08/04/18 0846  TROPIPOC 0.57*    BNP Recent Labs  Lab 08/04/18 0845  BNP 552.4*    DDimer No results for input(s): DDIMER in the last 168 hours.  Radiology/Studies:  Dg Chest Port 1 View  Result Date: 08/04/2018 CLINICAL DATA:  Shortness of Breath EXAM: PORTABLE CHEST 1 VIEW COMPARISON:  July 27, 2018 FINDINGS: There is atelectatic change in the left base with small left pleural effusion. Lungs elsewhere are clear. Heart is mildly enlarged with pulmonary vascularity within normal limits. No adenopathy. No bone lesions. IMPRESSION: Cardiomegaly. Small left pleural effusion. Left base atelectasis. No frank edema or consolidation  evident. Electronically Signed   By: Lowella Grip III M.D.   On: 08/04/2018 08:42    Assessment and Plan:   Edward [name] is a 47 y.o. male with hypertension, chronic kidney disease, poorly controlled type 2 diabetes mellitus complicated by peripheral neuropathy, peripheral vascular disease status-post right BKA and left transmetatarsal amputation, tobacco use and recent admission 12/9-12/13 for acute CHF and NSTEMI, found to have obstructive CAD treated w/ PCI as well as mild-moderately reduced LVEF at 40-45%, presenting back to  the [facility] ED, 4 days after discharge w/ CC of dyspnea.  Cardiology consulted at the request of Dr. Francia Greaves, Emergency Medicine.   1. Dyspnea: Pt denies CP. Troponin is abnormal but down from prior admit at 0.57 (troponin peaked at 3.0 last week when he had his NSTEMI). This is likely residual and trending downward. EKG unchanged. No acute ST changes. BNP is slightly higher compared to last week, at 552 (up from 436), however his CXR today shows no frank edema. Unable to obtain weight due to inability to stand currently w/o his prosthetic leg. However he does not appear grossly volume overload. Based on description of bouts of dyspnea, suspect his dyspnea is likely 2/2 to combination of medication related dyspnea from Brilinta use and possible mild CHF. Pt advised to continue to Brilitna and to try taking morning dose with a bit of caffeine, (cup of coffee or coke) to see if this helps. We will also give a dose of IV Lasix, 40 mg x 1 here in the ED. He has also felt anxious at home, which can also contribute to his dyspnea. Recommend adding Xanax 0.25 mg TID PRN. 30 tablets. No refills. He can f/u with PCP for further refills. He has close f/u arranged in our office later this week on 08/06/18. He will need to keep this f/u so that we can reassess symptoms and get repeat BMP. If he continues to have dyspnea, then may later consider stopping Brilinta, but we would prefer to allow more time to see if tolerance improves.   2. CAD: recent NSTEMI w/ Circumflex PCI. No CP. He reports full compliance w/ meds. Continue DAPT,  blocker and statin.   3. Systolic CHF: recent echo with mild-moderate LV dysfunction, EF 40-45%. He notes mild dyspnea and BNP is mildly elevated from prior baseline. He does not appear grossly volume overloaded and CXR did not show overt edema, however given symptoms will give a 1 x dose of IV lasix now. Continue 40 mg PO lasix daily starting tomorrow. We will get a f/u BMP in our office on  Thursday.   4. DM: followed by PCP.   5. HTN: BP controlled.   9. PVD/ Diabetic Neuropathy: s/p right BKA and left transmetatarsal.   10. CKD: SCr stable since last d/c at 2.01. Needs repeat BMP on 08/06/18 at office f/u.   11. Tobacco Abuse: pt reports that he is weaning down. Trying to quit given recent NSTEMI.      For questions or updates, please contact [place] Please consult [email or web address] for contact info under        Signed, Lyda Jester, PA-C  08/04/2018 10:23 AM   I have personally seen and examined this patient with Lyda Jester, PA-C. I agree with the assessment and plan as outlined above. He presents with anxiety and mild dyspnea. No evidence of active ACS. Troponin likely still trending down from recent cardiac event. He has no chest pain. EKG without ischemic changes.  I  have personally reviewed the EKG which shows sinus with non-specific ST abn, unchanged from prior EKG.  Labs reviewed personally. Renal function is stable. H/H is stable. BNP mildly higher.  My exam shows a WDWN male in NAD. Lungs overall clear. No loud murmurs. RRR Right BKA. Trace Left LE edema Plan: His dyspnea may be due to the Brilinta as there is no evidence of gross volume overload. I suspect he has mild volume overload. Will give one dose of IV Lasix in the ED. Will encourage him to drink caffeine when taking Brilinta. Xanax for anxiety. OK to d/c home today from the ED.  He has follow up in our office later this week on 08/06/18.   Lauree Chandler 08/04/2018'11:32 AM'

## 2018-08-04 NOTE — Discharge Instructions (Addendum)
Return for any problem.  Follow-up with your regular cardiology care as instructed.

## 2018-08-04 NOTE — Telephone Encounter (Signed)
Pt insurance is active through [place]. Ref# [id number]  Will fax over [facility] Reimbursement form to Dr. Tamala Julian  Will contact pt to see if he is interested in the Cardiac Rehab program. If interested, pt will need to complete f/u appt on. Once completed, pt will be contacted for scheduling upon review by the RN Navigator.

## 2018-08-04 NOTE — ED Provider Notes (Signed)
[facility] EMERGENCY DEPARTMENT Provider Note   CSN: [id number] Arrival date & time: 08/04/18  0820     History   Chief Complaint Chief Complaint  Patient presents with  . Shortness of Breath    HPI Edward [name] is a 47 y.o. male.  47 year old male with prior medical history as detailed below presents for evaluation of shortness of breath.  Patient reports recent admission for congestive heart failure exacerbation and NSTEMI.  Patient was discharged on 12/13.  He reports that he has been comfortable at home since his discharge.  Over the last 12 hours he is developed some mild shortness of breath.  He denies associated chest pain.  He is concerned that he may just be "anxious."   He denies associated cough, fever, nausea, vomiting, diaphoresis, chest pain, or other acute complaint.  The history is provided by the patient and medical records.  Shortness of Breath  This is a recurrent problem. The average episode lasts 12 hours. The problem occurs rarely.The current episode started 6 to 12 hours ago. The problem has not changed since onset.Pertinent negatives include no fever, no headaches, no rhinorrhea, no sore throat, no cough, no sputum production, no abdominal pain, no leg pain, no leg swelling and no claudication.    Past Medical History:  Diagnosis Date  . Anxiety    self reported  . Arthritis   . CHF (congestive heart failure) ([place])   . Coronary artery disease   . Depression    self reported  . Diabetes mellitus   . Hypertension     Patient Active Problem List   Diagnosis Date Noted  . NSTEMI (non-ST elevated myocardial infarction) ([place]) 07/28/2018  . Respiratory failure with hypoxia ([place]) 07/28/2018  . Acute heart failure ([place]) 07/28/2018  . Hypertensive emergency 07/28/2018  . S/P BKA (below knee amputation) unilateral, right ([place]) 07/15/2018  . Hypoglycemia   . CKD (chronic kidney disease), stage III ([place])   . Anxiety about health   .  Postoperative pain   . Labile blood glucose   . Essential hypertension   . History of transmetatarsal amputation of left foot ([place])   . Hypoalbuminemia due to protein-calorie malnutrition ([place])   . Stage 3 chronic kidney disease ([place])   . Poorly controlled type 2 diabetes mellitus with peripheral neuropathy ([place])   . Unilateral complete BKA, right, subsequent encounter ([place])   . Right below-knee amputee ([place]) 06/22/2018  . Acute blood loss anemia   . Post-operative pain   . Anemia of chronic disease   . Leukocytosis   . Unilateral complete BKA, right, initial encounter ([place])   . Tobacco abuse   . Hyperkalemia 06/20/2018  . Diabetic polyneuropathy associated with type 2 diabetes mellitus ([place])   . Severe protein-calorie malnutrition ([place])   . AKI (acute kidney injury) ([place]) 06/17/2018  . Normocytic anemia 06/17/2018  . Osteomyelitis of right foot ([place]) 05/14/2018  . DM2 (diabetes mellitus, type 2) ([place]) 05/14/2018  . Viral pharyngitis 01/14/2017  . Cigarette smoker 01/14/2017  . Elevated blood pressure reading 01/14/2017  . Avascular necrosis of hip, left ([place]) 10/11/2016  . Status post left hip replacement 10/11/2016  . Unilateral primary osteoarthritis, left hip 09/18/2016  . S/P transmetatarsal amputation of foot, left ([place]) 07/25/2016  . Balance problem 07/25/2016  . Sepsis ([place]) 05/07/2016  . Fournier's gangrene 08/16/2011  . Acute respiratory failure ([place]) 08/16/2011  . Septic shock(785.52) 08/16/2011  . Diabetic osteomyelitis ([place]) 08/16/2011    Past Surgical  History:  Procedure Laterality Date  . AMPUTATION Left 05/10/2016   Procedure: Left Transmetatarsal Amputation;  Surgeon: [name], MD;  Location: [facility];  Service: Orthopedics;  Laterality: Left;  . AMPUTATION Right 05/16/2018   Procedure: PARTIAL RAY AMPUTATION FIFTH TOE RIGHT FOOT;  Surgeon: [name], DPM;  Location: [facility];  Service: Podiatry;  Laterality: Right;  . AMPUTATION Right 06/17/2018    Procedure: AMPUTATION 4th RAY Right Foot;  Surgeon: Trula Slade, DPM;  Location: [facility];  Service: Podiatry;  Laterality: Right;  . AMPUTATION Right 06/19/2018   Procedure: RIGHT BELOW KNEE AMPUTATION;  Surgeon: [name], MD;  Location: [facility];  Service: Orthopedics;  Laterality: Right;  . APPLICATION OF WOUND VAC Right 06/19/2018   Procedure: APPLICATION OF WOUND VAC;  Surgeon: [name], MD;  Location: [facility];  Service: Orthopedics;  Laterality: Right;  . CARDIAC CATHETERIZATION  07/29/2018  . CIRCUMCISION  09/02/2011   Procedure: CIRCUMCISION ADULT;  Surgeon: Molli Hazard, MD;  Location: [facility];  Service: Urology;  Laterality: N/A;  . CORONARY STENT INTERVENTION N/A 07/29/2018   Procedure: CORONARY STENT INTERVENTION;  Surgeon: Leonie Man, MD;  Location: [facility];  Service: Cardiovascular;  Laterality: N/A;  . I&D EXTREMITY  09/02/2011   Procedure: IRRIGATION AND DEBRIDEMENT EXTREMITY;  Surgeon: Theodoro Kos, DO;  Location: [facility];  Service: Plastics;  Laterality: N/A;  . INCISION AND DRAINAGE OF WOUND  08/12/2011   Procedure: IRRIGATION AND DEBRIDEMENT WOUND;  Surgeon: Zenovia Jarred, MD;  Location: [facility];  Service: General;;  perineum  . IRRIGATION AND DEBRIDEMENT ABSCESS  08/12/2011   Procedure: IRRIGATION AND DEBRIDEMENT ABSCESS;  Surgeon: Molli Hazard, MD;  Location: [facility];  Service: Urology;  Laterality: N/A;  irrigation and debridment perineum    . IRRIGATION AND DEBRIDEMENT ABSCESS  08/14/2011   Procedure: MINOR INCISION AND DRAINAGE OF ABSCESS;  Surgeon: Molli Hazard, MD;  Location: [facility];  Service: Urology;  Laterality: N/A;  Perineal Wound Debridement;Placement Bilateral Testicular Thigh Pouches  . LEFT HEART CATH AND CORONARY ANGIOGRAPHY N/A 07/29/2018   Procedure: LEFT HEART CATH AND CORONARY ANGIOGRAPHY;  Surgeon: Leonie Man, MD;  Location: [facility];  Service:  Cardiovascular;  Laterality: N/A;  . TOTAL HIP ARTHROPLASTY Left 10/11/2016   Procedure: LEFT TOTAL HIP ARTHROPLASTY ANTERIOR APPROACH;  Surgeon: Mcarthur Rossetti, MD;  Location: [facility];  Service: Orthopedics;  Laterality: Left;        Home Medications    Prior to Admission medications   Medication Sig Start Date End Date Taking? Authorizing Provider  acetaminophen (TYLENOL) 325 MG tablet Take 1-2 tablets (325-650 mg total) by mouth every 6 (six) hours as needed for mild pain (pain score 1-3 or temp > 100.5). 07/01/18   Angiulli, Lavon Paganini, PA-C  aspirin EC 81 MG EC tablet Take 1 tablet (81 mg total) by mouth daily. 08/01/18   Daune Perch, NP  atorvastatin (LIPITOR) 80 MG tablet Take 1 tablet (80 mg total) by mouth daily at 6 PM. 07/31/18   Daune Perch, NP  blood glucose meter kit and supplies Dispense based on patient and insurance preference. Use up to four times daily as directed. (FOR ICD-9 250.00, 250.01). 07/01/18   Angiulli, Lavon Paganini, PA-C  furosemide (LASIX) 40 MG tablet Take 1 tablet (40 mg total) by mouth daily. 08/01/18   Daune Perch, NP  gabapentin (NEURONTIN) 100 MG capsule Take 1 capsule (  100 mg total) by mouth at bedtime. Patient taking differently: Take 200 mg by mouth at bedtime.  07/15/18   [name], MD  hydrALAZINE (APRESOLINE) 50 MG tablet Take 1 tablet (50 mg total) by mouth every 8 (eight) hours. 07/31/18   Daune Perch, NP  insulin aspart protamine - aspart (NOVOLOG MIX 70/30 FLEXPEN) (70-30) 100 UNIT/ML FlexPen Inject 0.15 mLs (15 Units total) into the skin 2 (two) times daily with a meal. 07/01/18   Angiulli, Lavon Paganini, PA-C  Insulin Pen Needle 31G X 5 MM MISC Use daily to inject insulin as instructed 07/01/18   Angiulli, Lavon Paganini, PA-C  isosorbide mononitrate (IMDUR) 30 MG 24 hr tablet Take 1 tablet (30 mg total) by mouth daily. 08/01/18   Daune Perch, NP  methocarbamol (ROBAXIN) 500 MG tablet Take 1 tablet (500 mg total) by mouth every  6 (six) hours as needed for muscle spasms. 07/01/18   Angiulli, Lavon Paganini, PA-C  metoprolol succinate (TOPROL-XL) 50 MG 24 hr tablet Take 1 tablet (50 mg total) by mouth daily. Take with or immediately following a meal. 08/01/18   Daune Perch, NP  Multiple Vitamin (MULTIVITAMIN WITH MINERALS) TABS tablet Take 1 tablet by mouth daily. 08/01/18   Daune Perch, NP  polyethylene glycol [facility] / GLYCOLAX) packet Take 17 g by mouth daily. 07/01/18   Angiulli, Lavon Paganini, PA-C  senna (SENOKOT) 8.6 MG TABS tablet Take 1 tablet (8.6 mg total) by mouth at bedtime. 07/01/18   Angiulli, Lavon Paganini, PA-C  sitaGLIPtin-metformin (JANUMET) 50-1000 MG tablet Take 1 tablet by mouth 2 (two) times daily with a meal. Patient taking differently: Take 1 tablet by mouth daily.  07/01/18   Angiulli, Lavon Paganini, PA-C  ticagrelor (BRILINTA) 90 MG TABS tablet Take 1 tablet (90 mg total) by mouth 2 (two) times daily. 07/31/18 01/27/19  Daune Perch, NP  traMADol (ULTRAM) 50 MG tablet Take 50 mg by mouth every 6 (six) hours as needed for moderate pain.    [provider]    Family History Family History  Problem Relation Age of Onset  . Diabetes Other     Social History Social History   Tobacco Use  . Smoking status: Current Every Day Smoker    Types: Cigarettes  . Smokeless tobacco: Never Used  Substance Use Topics  . Alcohol use: Yes    Alcohol/week: 0.0 standard drinks    Comment: has not had alcohol in a month  . Drug use: No     Allergies   Patient has no known allergies.   Review of Systems Review of Systems  Constitutional: Negative for fever.  HENT: Negative for rhinorrhea and sore throat.   Respiratory: Positive for shortness of breath. Negative for cough and sputum production.   Cardiovascular: Negative for claudication and leg swelling.  Gastrointestinal: Negative for abdominal pain.  Neurological: Negative for headaches.  All other systems reviewed and are  negative.    Physical Exam Updated Vital Signs BP 130/83 (BP Location: Right Arm)   Pulse 77   Temp 97.7 F (36.5 C) (Oral)   Resp (!) 24   SpO2 99%   Physical Exam Vitals signs and nursing note reviewed.  Constitutional:      General: He is not in acute distress.    Appearance: He is well-developed. He is not ill-appearing.  HENT:     Head: Normocephalic and atraumatic.     Mouth/Throat:     Mouth: Mucous membranes are moist.  Eyes:  Conjunctiva/sclera: Conjunctivae normal.     Pupils: Pupils are equal, round, and reactive to light.  Neck:     Musculoskeletal: Normal range of motion and neck supple.  Cardiovascular:     Rate and Rhythm: Normal rate and regular rhythm.     Heart sounds: Normal heart sounds.  Pulmonary:     Effort: Pulmonary effort is normal. No respiratory distress.     Breath sounds: Normal breath sounds.  Abdominal:     General: There is no distension.     Palpations: Abdomen is soft.     Tenderness: There is no abdominal tenderness.  Musculoskeletal: Normal range of motion.        General: No deformity.     Right lower leg: No edema.     Comments: SP Right BKA   Skin:    General: Skin is warm and dry.  Neurological:     Mental Status: He is alert and oriented to person, place, and time.      ED Treatments / Results  Labs (all labs ordered are listed, but only abnormal results are displayed) Labs Reviewed  COMPREHENSIVE METABOLIC PANEL - Abnormal; Notable for the following components:      Result Value   CO2 19 (*)    Glucose, Bld 186 (*)    BUN 29 (*)    Creatinine, Ser 2.01 (*)    Calcium 8.7 (*)    Albumin 3.3 (*)    GFR calc non Af Amer 38 (*)    GFR calc Af Amer 44 (*)    All other components within normal limits  BRAIN NATRIURETIC PEPTIDE - Abnormal; Notable for the following components:   B Natriuretic Peptide 552.4 (*)    All other components within normal limits  CBC WITH DIFFERENTIAL/PLATELET - Abnormal; Notable for  the following components:   RBC 4.15 (*)    Hemoglobin 10.9 (*)    HCT 36.2 (*)    All other components within normal limits  I-STAT TROPONIN, ED - Abnormal; Notable for the following components:   Troponin i, poc 0.57 (*)    All other components within normal limits  PROTIME-INR    EKG EKG Interpretation  Date/Time:  Tuesday August 04 2018 08:29:20 EST Ventricular Rate:  77 PR Interval:    QRS Duration: 96 QT Interval:  412 QTC Calculation: 467 R Axis:   -11 Text Interpretation:  Sinus rhythm Prolonged PR interval Borderline low voltage, extremity leads Confirmed by Dene Gentry [phone]) on 08/04/2018 8:32:22 AM   Radiology Dg Chest Port 1 View  Result Date: 08/04/2018 CLINICAL DATA:  Shortness of Breath EXAM: PORTABLE CHEST 1 VIEW COMPARISON:  July 27, 2018 FINDINGS: There is atelectatic change in the left base with small left pleural effusion. Lungs elsewhere are clear. Heart is mildly enlarged with pulmonary vascularity within normal limits. No adenopathy. No bone lesions. IMPRESSION: Cardiomegaly. Small left pleural effusion. Left base atelectasis. No frank edema or consolidation evident. Electronically Signed   By: Lowella Grip III M.D.   On: 08/04/2018 08:42    Procedures Procedures (including critical care time)  Medications Ordered in ED Medications  furosemide (LASIX) injection 40 mg (has no administration in time range)  ALPRAZolam (XANAX) tablet 0.25 mg (has no administration in time range)     Initial Impression / Assessment and Plan / ED Course  I have reviewed the triage vital signs and the nursing notes.  Pertinent labs & imaging results that were available during my care of the patient  were reviewed by me and considered in my medical decision making (see chart for details).      1000 Cardiology aware of case - will evaluate in the ED.   MDM  Screen complete  Patient is presenting for evaluation of shortness of breath.  Patient with  recent intervention by cardiology.  Screening labs obtained in the ED are without significant abnormality.  Patient has been seen by cardiology service in the ED.  Cardiology agree with plan to disposal home.  Patient understands need for close follow-up.  We will trial course of Xanax as per cardiology's request for treatment of possible anxiety.  Strict return precautions given and understood.  Importance of close follow-up is stressed.   Final Clinical Impressions(s) / ED Diagnoses   Final diagnoses:  Dyspnea, unspecified type    ED Discharge Orders         Ordered    ALPRAZolam (XANAX) 0.25 MG tablet  3 times daily PRN     08/04/18 1128    ALPRAZolam (XANAX) 0.25 MG tablet  3 times daily PRN     08/04/18 1139           Valarie Merino, MD 08/04/18 1207

## 2018-08-04 NOTE — Telephone Encounter (Signed)
Currently admitted.

## 2018-08-05 ENCOUNTER — Encounter (HOSPITAL_COMMUNITY): Payer: Medicaid Other

## 2018-08-05 DIAGNOSIS — I129 Hypertensive chronic kidney disease with stage 1 through stage 4 chronic kidney disease, or unspecified chronic kidney disease: Secondary | ICD-10-CM

## 2018-08-05 DIAGNOSIS — Z89511 Acquired absence of right leg below knee: Secondary | ICD-10-CM | POA: Diagnosis not present

## 2018-08-05 DIAGNOSIS — Z4781 Encounter for orthopedic aftercare following surgical amputation: Secondary | ICD-10-CM

## 2018-08-05 DIAGNOSIS — N183 Chronic kidney disease, stage 3 (moderate): Secondary | ICD-10-CM

## 2018-08-05 DIAGNOSIS — E1151 Type 2 diabetes mellitus with diabetic peripheral angiopathy without gangrene: Secondary | ICD-10-CM

## 2018-08-05 DIAGNOSIS — Z794 Long term (current) use of insulin: Secondary | ICD-10-CM

## 2018-08-05 DIAGNOSIS — D649 Anemia, unspecified: Secondary | ICD-10-CM

## 2018-08-05 DIAGNOSIS — Z89432 Acquired absence of left foot: Secondary | ICD-10-CM

## 2018-08-05 DIAGNOSIS — E1122 Type 2 diabetes mellitus with diabetic chronic kidney disease: Secondary | ICD-10-CM | POA: Diagnosis not present

## 2018-08-05 DIAGNOSIS — Z720 Tobacco use: Secondary | ICD-10-CM

## 2018-08-05 NOTE — Telephone Encounter (Signed)
Left message to call back  

## 2018-08-06 ENCOUNTER — Ambulatory Visit (INDEPENDENT_AMBULATORY_CARE_PROVIDER_SITE_OTHER): Payer: Medicaid Other | Admitting: Cardiology

## 2018-08-06 ENCOUNTER — Encounter: Payer: Self-pay | Admitting: Cardiology

## 2018-08-06 ENCOUNTER — Ambulatory Visit (HOSPITAL_COMMUNITY)
Admission: RE | Admit: 2018-08-06 | Discharge: 2018-08-06 | Disposition: A | Discharge status: Home / Self Care | Payer: Medicaid Other | Source: Ambulatory Visit | Attending: Internal Medicine | Admitting: Internal Medicine

## 2018-08-06 ENCOUNTER — Encounter (HOSPITAL_COMMUNITY): Payer: Medicaid Other

## 2018-08-06 ENCOUNTER — Encounter: Payer: Medicaid Other | Admitting: *Deleted

## 2018-08-06 VITALS — BP 110/66 | HR 91

## 2018-08-06 DIAGNOSIS — I739 Peripheral vascular disease, unspecified: Secondary | ICD-10-CM | POA: Insufficient documentation

## 2018-08-06 DIAGNOSIS — Z006 Encounter for examination for normal comparison and control in clinical research program: Secondary | ICD-10-CM

## 2018-08-06 DIAGNOSIS — Z9861 Coronary angioplasty status: Secondary | ICD-10-CM

## 2018-08-06 DIAGNOSIS — I10 Essential (primary) hypertension: Secondary | ICD-10-CM | POA: Diagnosis not present

## 2018-08-06 DIAGNOSIS — Z794 Long term (current) use of insulin: Secondary | ICD-10-CM

## 2018-08-06 DIAGNOSIS — I5043 Acute on chronic combined systolic (congestive) and diastolic (congestive) heart failure: Secondary | ICD-10-CM

## 2018-08-06 DIAGNOSIS — N183 Chronic kidney disease, stage 3 unspecified: Secondary | ICD-10-CM

## 2018-08-06 DIAGNOSIS — I214 Non-ST elevation (NSTEMI) myocardial infarction: Secondary | ICD-10-CM | POA: Diagnosis not present

## 2018-08-06 DIAGNOSIS — I428 Other cardiomyopathies: Secondary | ICD-10-CM

## 2018-08-06 DIAGNOSIS — I2510 Atherosclerotic heart disease of native coronary artery without angina pectoris: Secondary | ICD-10-CM | POA: Insufficient documentation

## 2018-08-06 DIAGNOSIS — IMO0001 Reserved for inherently not codable concepts without codable children: Secondary | ICD-10-CM

## 2018-08-06 DIAGNOSIS — I429 Cardiomyopathy, unspecified: Secondary | ICD-10-CM | POA: Insufficient documentation

## 2018-08-06 DIAGNOSIS — E118 Type 2 diabetes mellitus with unspecified complications: Secondary | ICD-10-CM

## 2018-08-06 MED ORDER — STUDY - AEGIS II STUDY - PLACEBO OR CSL112 (PI-HILTY)
170.0000 mL | Freq: Once | INTRAVENOUS | Status: AC
  Administered 2018-08-06: 170 mL via INTRAVENOUS
  Filled 2018-08-06: qty 170

## 2018-08-06 NOTE — Telephone Encounter (Signed)
Left message to call back  

## 2018-08-06 NOTE — Assessment & Plan Note (Signed)
CRI, PVD

## 2018-08-06 NOTE — Assessment & Plan Note (Signed)
!  09/28/17- Troponin 2.8 (no chest pain)

## 2018-08-06 NOTE — Assessment & Plan Note (Signed)
Probably mixed secondary to uncontrolled HTN and CAD EF 40-45% by echo 07/29/18

## 2018-08-06 NOTE — Research (Signed)
[facility] Screening and Visit 1   Dr Lia Foyer reviewed labs before infusion, and did physical exam.   Inclusion/Exclusion Checklist: .  Inclusions:   Y N   _0  _1  Male or male at least 48 years of age  _2  _3  Evidence of type I (spontaneous) MI (STEMI or NSTEMI) caused by atherothrombotic artery disease as defined by the following:  _4  _5  a. Detection of a rise and/or fall in Troponin I or T with at least 1 value about the 99% upper reference limit.     (AND)---  Any 1 or more of the following:   _6  _7  - symptoms of ischemia (ie, resulting from a primary coronary    artery event)  _8  _9  - New or presumably new significant ST/T wave changes or left bundle branch block.  _10  _11       - Development of pathological Q waves on EKG  _12  _13  - Imaging evidence of new loss or viable myocardium or regional wall motion abnormality.  _14  _15  - ID of intracoronary thrombus by angiography.  _16  _17  No suspicion of acute kidney injury at least 12 hours after angiography OR after first medical contract for subject's not undergoing angiography There must be documented evidence of stable renal function defined as no more than an increase in Serum Creatinine < 0.32m/dl from pre-contrast serum creatinine value.  (Before _1.82_   12 hrs after __2.01_)    Evidence of multi-vessel coronary artery disease defined as:  _18  _19  A. At least 50% stenosis of theleft main coronary artery or at least 2 epicardial coronary artery territories (LAD, LCx, RCA) on catherization performed during the index hospitalization.   _20  _21  B. Prior cardiac catherization documenting @ least 50% stenosis of the LM or at least 2 epicardial >1 epicardial artery territories (LAD, LCx, RCA)  _22  _23  C. Prior PCI and evidence of 50% stenosis of at least 1 epicardial coronary artery territory different from prior revascularized artery territory.   _24  _25  D. Prior multivessel coronary artery bypass grafting.  _26  _27  Plus either Established risk factors:   _28  _29  o On pharmacological treatment for diabetes mellitus                 OR    TWO of the following  _30  _31  o Prior history of MI  _32  _33  o Age ? 65 years  _34  _35  o Peripheral arterial disease defined as meeting at least 1 of the following criteria:  _36  _37          +   Current intermittent claudication or resting limb ischemia and ABI    ?0.90  _38  _39          +   History of peripheral revascularization (surgical or percutaneous)  _40  _41          +  History of limb amputation due to PAD  _42  _43          +  Angiographic evidence (using computed tomographic angiography, MRA, or invasive angiography or a peripheral artery stenosis ?50%.  _44  _45  If the male subject without child bearing potential, not breastfeeding, not pregnant, and if of child bearing potential agree to contraception or lifestyle methods to avoid pregnancy? Child-bearing potential (must select all)   ____ not pregnant (by urine or serum hCG AND   ____ willing to use an acceptable method of contraception to avoid pregnancy during the study and for 3 months after last dose of investional product (refer to acceptable methods per protocol)  ____ if breastfeeding, willing to cease breastfeeding Date of pregnancy test: (____ /_____/ ________)  Result  - or + Not of Child bearing potential (select one)   ____ Age >= 67   ____ Age 57-60 with amenorrhea for at least 1 year with documented evidence of follicle-stimulating hormone level >40 IU/L   ____ Surgically sterile for at least 3 months prior to randomization  _0  _1  Investigator believes that the subject is willing and able to adhere to all protocol requirements.   _2  _3  Willing to not participate in another investigational study until completion of their final study visit.     Exclusions:  Y N   _4  _5  If these are the reason for MI (pt is excluded)  _6  _7  1. Myocardial necrosis due mismatch between myocardial oxygen demand and supply, usually due to fixed coronary disease with  increased demand leading to MI  _8  _9  2. Cardiac death due to MI  _10  _11  3. Myocardial necrosis due to complications from a PCI  _12  _13  4. Myocardial necrosis due to stent thrombosis  _14  _15  5. Myocardial necrosis due to in stent restenosis as the only etiology  _16  _17  6. Myocardial necrosis in the stenting of coronary artery bypass grafting  _18  _19  Ongoing hemodynamic instability  _20  _21       +  History of NYHA Class III or IV heart failure within the last year  _22  _23       +  Killip Class III or IV heart failure  _24  _25       +  Sustained and/or symptomatic hypotension (SBP <90 mm HG)  _26  _27       +  Known left ventricular ejection fraction of <30%  _28  _29  Evidence of hepatobiliary disease as indicated by any 1 or more of the   following at screening:  _30  _31       +  Current active hepatic dysfunction or active biliary obstruction  _32  _33       +       +  Chronic or prior history of cirrhosis or of infectious / inflammatory hepatitis NOTE: If a patient has a medical history of recovered Hep A, B, or C without evidence of cirrhosis, he/she could be considered for inclusion if there is documented evidence that there is no active infection (ie, antigen negative)  _34  _35       + Hepatic lab abnormalities: ALT > 3 x ULN or Total bilirubin > 2x ULN at randomization.  _36  _37  Severe chronic kidney disease (eGFR of <2m) or on dialysis  _38  _39  Plan to undergo scheduled coronary artery bypass graft surgery after randomization, as determined at the time of screening  _40  _41  Known history of allergies to soybeans, peanuts, albumin  _42  _43  Body weight <50 kg  _44  _45  A known history of IgA deficiency or antibodies to IgA  _46  _47  A comorbid condition with an estimated life expectancy of ? 6 months at time of consent  _48  _49  Women who are pregnant or breastfeeding at time of randomization  _50  _51  Participated in another interventional clinical study at the time of consent  _52  _53  Treatment with anticancer  therapy  _54  _55  Previously randomized or participated in this study or previously exposed to CSL112   DEMOGRAPHICS:  Patient Name: Edward ThrallBirth Date: [birth year] Sex: Male  Race: black   Child Bearing: ? Yes    ? No ? Tubial ligation ? Hysterectomy  ? postmenopausal   Height: 185  cm  Weight: 96 kg   Index Procedure:  Onset date of symptoms: 7-Dec-19 Onset of symptoms: Click or tap to enter a date.  Date of First contact at hospital: 9-Dec-19 Time of first contact at hospital: 2340  Admission Date: 9-Dec-19   Discharge Date: 13-Dec-19 Discharge Time: 5176   Vital Signs: Date 07/28/2018     BP: 176/117  Pulse: 105   Concomitant medications: Every visit: ? See med sheet  BMP Pre Contrast IV 07/29/18 @ 0232 1.82  CMP Post Contrast IV 12 hours later: 07/31/18 @ 0219 2.01 Hepatic Panel: 07/31/18 @ 0219 ALT: 12 Total Bili: 0.4 Direct Bili: 0.1   Medical History:  ? CAD ? Prior MI ? PAD  ? History of Heart Failure ? Moderate to severe valvular dx ? AFib  ? Prior Coronary Revascularization  if YES please select Yes or No below:  CABG ? Yes   ? No           PCI with stent ? Yes   ? No          PCI without stent ? Yes ? No  ? CVA if checked please select one of the following Choose an item.  ? Hypertension  ? Gilberts syndrome ? CKD   ? Hypocholesteremia ? DM ? Smoker        ? eCigarette  Killip Class Stage 1     EQ-5D-3L ?  Future Biomedical Research: Consented ? Yes     ? No If no please date they withdrew consent from biomedical research Click or tap to enter a date.  Central Labs Before Start of Infusion: ? Biochemistry panel      ? Hematology     ? Immunogenicity   (30 mins before infusion)   ? Parvovirus  ? FBR sample ? PK/PD sample Central Labs End of Infusion:   ? PK/PD Central Blood Draw Time: Before SOI: 07/31/18  @ 0925_ After EOI: _12/13/19 @ 1135___ Infusion Start Time: 07/31/2018 9:30 AM  Infusion End Time: 07/31/2018 11:30 AM  EQ-5D-5L  MOBILITY:    I HAVE  NO PROBLEMS WALKING _0   I HAVE SLIGHT PROBLEMS WALKING _1   I HAVE MODERATE PROBLEMS WALKING _2   I HAVE SEVERE PROBLEMS WALKING _3   I AM UNABLE TO WALK  _4     SELF-CARE:   I HAVE NO PROBLEMS WASING OR DRESSING MYSELF  _5   I HAVE SLIGHT PROBLEMS WASHING OR DRESSING MYSELF  _6   I HAVE MODERATE PROBLEMS WASHING OR DRESSING MYSELF _7   I HAVE SEVERE PROBLEMS WASHING OR DRESSING MYSELF  _8   I HAVE SEVERE PROBLEMS WASHING OR DRESSING MYSELF  _9   I AM UNABLE TO WASH OR DRESS MYSELF _10     USUAL ACTIVITIES: (E.G. WORK/STUDY/HOUSEWORK/FAMILY OR LEISURE ACTIVITIES.    I HAVE NO PROBLEMS DOING MY USUAL ACTIVITIES _11   I HAVE SLIGHT PROBLEMS DOING MY USUAL ACTIVITIES _12   I HAVE MODERATE PROBLEMS DOING MY USUAL ACTIVIITIES _13   I HAVE SEVERE PROBLEMS DOING MY USUAL ACTIVITIES _14   I AM UNABLE TO DO MY USUAL ACTIVITIES _15     PAIN /DISCOMFORT   I HAVE NO PAIN OR DISCOMFORT _16   I HAVE SLIGHT PAIN OR DISCOMFORT _17   I HAVE MODERATE PAIN OR DISCOMFORT _18   I HAVE SEVERE PAIN OR DISCOMFORT _19   I HAVE EXTREME PAIN OR DISCOMFORT _20     ANXIETY/DEPRESSION   I AM NOT ANXIOUS OR DEPRESSED _21   I AM SLIGHTLY ANXIOUS OR DEPRESSED _22   I AM MODERATELY ANXIOUS OR DREPRESSED [  x]  I AM SEVERELY ANXIOUS OR DEPRESSED _0   I AM EXTREMELY ANXIOUS OR DEPRESSED _1     SCALE OF 0-100 HOW WOULD YOU RATE TODAY?  0 IS THE WORSE AND 100 IS THE BEST HEALTH YOU CAN IMAGINE: 80    Pre labs:   8d ago 07/29/18 @ 0232  Sodium 135 - 145 mmol/L 138   Potassium 3.5 - 5.1 mmol/L 3.8   Chloride 98 - 111 mmol/L 108   CO2 22 - 32 mmol/L 23   Glucose, Bld 70 - 99 mg/dL 136High    BUN 6 - 20 mg/dL 28High    Creatinine, Ser 0.61 - 1.24 mg/dL 1.82High    Calcium 8.9 - 10.3 mg/dL 8.2Low    GFR calc non Af Amer >60 mL/min NOT CALCULATED   GFR calc Af Amer >60 mL/min NOT CALCULATED   Anion gap 5 - 15 7    Post Labs:    07/31/18 @ 0219  Sodium 135 - 145 mmol/L 140   Potassium 3.5 - 5.1 mmol/L 3.9   Chloride 98 - 111 mmol/L 106    CO2 22 - 32 mmol/L 23   Glucose, Bld 70 - 99 mg/dL 126High    BUN 6 - 20 mg/dL 27High    Creatinine, Ser 0.61 - 1.24 mg/dL 2.01High    Calcium 8.9 - 10.3 mg/dL 8.2Low    GFR calc non Af Amer >60 mL/min 38Low    GFR calc Af Amer >60 mL/min 44Low      07/31/18 @ 0219  Total Protein 6.5 - 8.1 g/dL 5.7Low    Albumin 3.5 - 5.0 g/dL 2.7Low    AST 15 - 41 U/L 18   ALT 0 - 44 U/L 12   Alkaline Phosphatase 38 - 126 U/L 45   Total Bilirubin 0.3 - 1.2 mg/dL 0.4   Bilirubin, Direct 0.0 - 0.2 mg/dL <0.1   Indirect Bilirubin 0.3 - 0.9 mg/dL NOT

## 2018-08-06 NOTE — Progress Notes (Signed)
08/06/2018 Edward [name]   16-Jun-1971  [id number]  Primary Physician [name], MD Primary Cardiologist: Dr Stanford Breed  HPI: [name] is a pleasant 47 year old male who was seen in the hospital July 28 2018.  He had presented with acute pulmonary edema a week or 2 after he was discharged from rehab after a right below the knee amputation.  The patient has a history of insulin-dependent diabetes, hypertension, chronic renal insufficiency stage III, and peripheral vascular disease.  He has had prior left metatarsal amputation and as noted right below the knee amputation.  Patient was admitted and underwent echocardiogram which revealed an EF of 40 to 45% with moderate LVH and moderate mitral regurgitation.  Catheterization done 07/29/2018 revealed an 85% mid circumflex stenosis, 90% distal LAD, severe small first and second diagonal disease and septal perforator disease which was also small.  The patient underwent circumflex intervention with DES.  He is seen today in follow-up.  The patient did go to the emergency room on 08/04/2018 with shortness of breath and anxiety.  It was felt this was a combination of Brilinta and anxiety about his recent cardiac issues.  Xanax 0.25 mg 3 times daily as needed was recommended, he did receive 1 dose of IV Lasix in the emergency room.  We also suggested he take his Brilinta with some caffeine.  He is in the office today for follow-up.  He is feeling better since he was seen in the emergency room.  He believes the caffeine is been the key.  His son accompanied him.     Current Outpatient Medications  Medication Sig Dispense Refill  . acetaminophen (TYLENOL) 325 MG tablet Take 1-2 tablets (325-650 mg total) by mouth every 6 (six) hours as needed for mild pain (pain score 1-3 or temp > 100.5).    Marland Kitchen ALPRAZolam (XANAX) 0.25 MG tablet Take 1 tablet (0.25 mg total) by mouth 3 (three) times daily as needed for anxiety. 30 tablet 0  . aspirin EC 81 MG EC  tablet Take 1 tablet (81 mg total) by mouth daily. 90 tablet 3  . atorvastatin (LIPITOR) 80 MG tablet Take 1 tablet (80 mg total) by mouth daily at 6 PM. 90 tablet 3  . blood glucose meter kit and supplies Dispense based on patient and insurance preference. Use up to four times daily as directed. (FOR ICD-9 250.00, 250.01). 1 each 0  . docusate sodium (COLACE) 100 MG capsule Take 100 mg by mouth as needed for mild constipation.    . furosemide (LASIX) 40 MG tablet Take 1 tablet (40 mg total) by mouth daily. 90 tablet 3  . gabapentin (NEURONTIN) 100 MG capsule Take 1 capsule (100 mg total) by mouth at bedtime. 30 capsule 1  . hydrALAZINE (APRESOLINE) 50 MG tablet Take 1 tablet (50 mg total) by mouth every 8 (eight) hours. 270 tablet 3  . insulin aspart protamine - aspart (NOVOLOG MIX 70/30 FLEXPEN) (70-30) 100 UNIT/ML FlexPen Inject 0.15 mLs (15 Units total) into the skin 2 (two) times daily with a meal. 15 mL 11  . Insulin Pen Needle 31G X 5 MM MISC Use daily to inject insulin as instructed 100 each 2  . isosorbide mononitrate (IMDUR) 30 MG 24 hr tablet Take 1 tablet (30 mg total) by mouth daily. 90 tablet 3  . methocarbamol (ROBAXIN) 500 MG tablet Take 1 tablet (500 mg total) by mouth every 6 (six) hours as needed for muscle spasms. 60 tablet 0  . metoprolol succinate (  TOPROL-XL) 50 MG 24 hr tablet Take 1 tablet (50 mg total) by mouth daily. Take with or immediately following a meal. 90 tablet 3  . Multiple Vitamin (MULTIVITAMIN WITH MINERALS) TABS tablet Take 1 tablet by mouth daily. 90 tablet 3  . sitaGLIPtin-metformin (JANUMET) 50-1000 MG tablet Take 1 tablet by mouth 2 (two) times daily with a meal. (Patient taking differently: Take 1 tablet by mouth daily. ) 60 tablet 3  . ticagrelor (BRILINTA) 90 MG TABS tablet Take 1 tablet (90 mg total) by mouth 2 (two) times daily. 60 tablet 0  . traMADol (ULTRAM) 50 MG tablet Take 50 mg by mouth every 6 (six) hours as needed for moderate pain.     No  current facility-administered medications for this visit.     No Known Allergies  Past Medical History:  Diagnosis Date  . Anxiety    self reported  . Arthritis   . CHF (congestive heart failure) ([place])   . Coronary artery disease   . Depression    self reported  . Diabetes mellitus   . Hypertension     Social History   Socioeconomic History  . Marital status: Widowed    Spouse name: Not on file  . Number of children: Not on file  . Years of education: Not on file  . Highest education level: Not on file  Occupational History  . Occupation: Disabled  Social Needs  . Financial resource strain: Not on file  . Food insecurity:    Worry: Not on file    Inability: Not on file  . Transportation needs:    Medical: Not on file    Non-medical: Not on file  Tobacco Use  . Smoking status: Current Every Day Smoker    Types: Cigarettes  . Smokeless tobacco: Never Used  Substance and Sexual Activity  . Alcohol use: Yes    Alcohol/week: 0.0 standard drinks    Comment: has not had alcohol in a month  . Drug use: No  . Sexual activity: Not on file  Lifestyle  . Physical activity:    Days per week: Not on file    Minutes per session: Not on file  . Stress: Not on file  Relationships  . Social connections:    Talks on phone: Not on file    Gets together: Not on file    Attends religious service: Not on file    Active member of club or organization: Not on file    Attends meetings of clubs or organizations: Not on file    Relationship status: Not on file  . Intimate partner violence:    Fear of current or ex partner: Not on file    Emotionally abused: Not on file    Physically abused: Not on file    Forced sexual activity: Not on file  Other Topics Concern  . Not on file  Social History Narrative   Lives w/ his 2 sons.     Family History  Problem Relation Age of Onset  . Diabetes Other      Review of Systems: General: negative for chills, fever, night sweats or  weight changes.  Cardiovascular: negative for chest pain, dyspnea on exertion, edema, orthopnea, palpitations, paroxysmal nocturnal dyspnea or shortness of breath Dermatological: negative for rash Respiratory: negative for cough or wheezing Urologic: negative for hematuria Abdominal: negative for nausea, vomiting, diarrhea, bright red blood per rectum, melena, or hematemesis Neurologic: negative for visual changes, syncope, or dizziness All other systems reviewed  and are otherwise negative except as noted above.    Blood pressure 112/68, pulse 91, height 6' 1"  (1.854 m), SpO2 96 %.  General appearance: alert, cooperative, no distress and in wheel chair Lungs: clear to auscultation bilaterally Heart: regular rate and rhythm Extremities: Rt BKA, no LLE edema Neurologic: Grossly normal  EKG NSR, poor anterior RW  ASSESSMENT AND PLAN:   Acute on chronic combined systolic and diastolic CHF, NYHA class 2 (HCC) Pt seen by cardiology Dec 2019 for CHF with NSTEMI (no chest pain) EF 40-45%, CA at cath- PCI CFX  Non-ST elevation (NSTEMI) myocardial infarction [facility]) !09/28/17- Troponin 2.8 (no chest pain)  Stage 3 chronic kidney disease (HCC) SCr 2.0- GFR 44 Aug 04 2018  PVD (peripheral vascular disease) ([place]) S/P Rt BKA, Lt MT amputation  Insulin dependent diabetes mellitus with complications ([place]) CRI, PVD  Cardiomyopathy ([place]) Probably mixed secondary to uncontrolled HTN and CAD EF 40-45% by echo 07/29/18   PLAN I did not adjust Mr. Rhett medications today.  He seems to be doing stable on his current doses.  He is not a candidate for an ACE or ARB. He just had a BMP a couple days ago and I did not repeat this.  He is supposed to see Dr. Berdine Addison in a few days, his BMP can be checked then.  He has a follow-up scheduled for February with Dr. Stanford Breed and will keep this.  Kerin Ransom PA-C 08/06/2018 9:58 AM

## 2018-08-06 NOTE — Research (Signed)
Pt here today for [facility] infusion 2 visit 3, he is doing well. Had an episode of sob after taking brilinta came to ER on Tuesday, he was started on some anxiety medication and told to drink caffeine with Brilinta. He says that it has gotten better since starting that. He feels good this am, with no complaints of sob or cp.                                    "CONSENT"   YES     NO   Continuing further Investigational Product and study visits for follow-up? [x]  []   Continuing consent from future biomedical research [x]  []                                      "EVENTS"    YES     NO  AE   (IF YES SEE SOURCE) [x]  []   SAE  (IF YES SEE SOURCE) []  [x]   ENDPOINT   (IF YES SEE SOURCE) []  [x]   REVASCULARIZATION  (IF YES SEE SOURCE) []  [x]   AMPUTATION   (IF YES SEE SOURCE) []  [x]   TROPONIN'S  (IF YES SEE SOURCE) [x]  []      Current Outpatient Medications:  .  acetaminophen (TYLENOL) 325 MG tablet, Take 1-2 tablets (325-650 mg total) by mouth every 6 (six) hours as needed for mild pain (pain score 1-3 or temp > 100.5)., Disp: , Rfl:  .  ALPRAZolam (XANAX) 0.25 MG tablet, Take 1 tablet (0.25 mg total) by mouth 3 (three) times daily as needed for anxiety., Disp: 30 tablet, Rfl: 0 .  aspirin EC 81 MG EC tablet, Take 1 tablet (81 mg total) by mouth daily., Disp: 90 tablet, Rfl: 3 .  atorvastatin (LIPITOR) 80 MG tablet, Take 1 tablet (80 mg total) by mouth daily at 6 PM., Disp: 90 tablet, Rfl: 3 .  blood glucose meter kit and supplies, Dispense based on patient and insurance preference. Use up to four times daily as directed. (FOR ICD-9 250.00, 250.01)., Disp: 1 each, Rfl: 0 .  docusate sodium (COLACE) 100 MG capsule, Take 100 mg by mouth as needed for mild constipation., Disp: , Rfl:  .  furosemide (LASIX) 40 MG tablet, Take 1 tablet (40 mg total) by mouth daily., Disp: 90 tablet, Rfl: 3 .  gabapentin (NEURONTIN) 100 MG capsule, Take 1 capsule (100 mg total) by mouth at bedtime., Disp: 30 capsule, Rfl: 1 .   hydrALAZINE (APRESOLINE) 50 MG tablet, Take 1 tablet (50 mg total) by mouth every 8 (eight) hours., Disp: 270 tablet, Rfl: 3 .  insulin aspart protamine - aspart (NOVOLOG MIX 70/30 FLEXPEN) (70-30) 100 UNIT/ML FlexPen, Inject 0.15 mLs (15 Units total) into the skin 2 (two) times daily with a meal., Disp: 15 mL, Rfl: 11 .  Insulin Pen Needle 31G X 5 MM MISC, Use daily to inject insulin as instructed, Disp: 100 each, Rfl: 2 .  isosorbide mononitrate (IMDUR) 30 MG 24 hr tablet, Take 1 tablet (30 mg total) by mouth daily., Disp: 90 tablet, Rfl: 3 .  methocarbamol (ROBAXIN) 500 MG tablet, Take 1 tablet (500 mg total) by mouth every 6 (six) hours as needed for muscle spasms., Disp: 60 tablet, Rfl: 0 .  metoprolol succinate (TOPROL-XL) 50 MG 24 hr tablet, Take 1 tablet (50 mg total) by mouth daily.  Take with or immediately following a meal., Disp: 90 tablet, Rfl: 3 .  Multiple Vitamin (MULTIVITAMIN WITH MINERALS) TABS tablet, Take 1 tablet by mouth daily., Disp: 90 tablet, Rfl: 3 .  sitaGLIPtin-metformin (JANUMET) 50-1000 MG tablet, Take 1 tablet by mouth 2 (two) times daily with a meal. (Patient taking differently: Take 1 tablet by mouth daily. ), Disp: 60 tablet, Rfl: 3 .  ticagrelor (BRILINTA) 90 MG TABS tablet, Take 1 tablet (90 mg total) by mouth 2 (two) times daily., Disp: 60 tablet, Rfl: 0 .  traMADol (ULTRAM) 50 MG tablet, Take 50 mg by mouth every 6 (six) hours as needed for moderate pain., Disp: , Rfl:  No current facility-administered medications for this visit.   Facility-Administered Medications Ordered in Other Visits:  .  AEGIS II Study - CSL112 / placebo (PI: Hilty), 170 mL, Intravenous, Once, Hilty, Nadean Corwin, MD, Last Rate: 85 mL/hr at 08/06/18 1054, 170 mL at 08/06/18 1054

## 2018-08-06 NOTE — Patient Instructions (Signed)
Medication Instructions:  Your physician recommends that you continue on your current medications as directed. Please refer to the Current Medication list given to you today.  If you need a refill on your cardiac medications before your next appointment, please call your pharmacy.   Lab work: None ordered If you have labs (blood work) drawn today and your tests are completely normal, you will receive your results only by: Marland Kitchen MyChart Message (if you have MyChart) OR . A paper copy in the mail If you have any lab test that is abnormal or we need to change your treatment, we will call you to review the results.  Testing/Procedures: None ordered  Follow-Up: At [facility], you and your health needs are our priority.  As part of our continuing mission to provide you with exceptional heart care, we have created designated Provider Care Teams.  These Care Teams include your primary Cardiologist (physician) and Advanced Practice Providers (APPs -  Physician Assistants and Nurse Practitioners) who all work together to provide you with the care you need, when you need it. . You will need to follow up as planned with Dr.Crenshaw on Oct 12, 2018 @ 8:40am

## 2018-08-06 NOTE — Research (Signed)
[facility]   Pt doing well, no complaints of cp or sob. Will contiune to monitor pt and will see patient back next week for next infusion.                                    "CONSENT"   YES     NO   Continuing further Investigational Product and study visits for follow-up? [x]  []   Continuing consent from future biomedical research [x]  []                                    "EVENTS"    YES     NO  AE   (IF YES SEE SOURCE) []  [x]   SAE  (IF YES SEE SOURCE) []  [x]   ENDPOINT   (IF YES SEE SOURCE) []  [x]   REVASCULARIZATION  (IF YES SEE SOURCE) []  [x]   AMPUTATION   (IF YES SEE SOURCE) []  [x]   TROPONIN'S  (IF YES SEE SOURCE) []  [x] 

## 2018-08-06 NOTE — Assessment & Plan Note (Signed)
S/P Rt BKA, Lt MT amputation

## 2018-08-06 NOTE — Assessment & Plan Note (Signed)
Pt seen by cardiology Dec 2019 for CHF with NSTEMI (no chest pain) EF 40-45%, CA at cath- PCI CFX

## 2018-08-06 NOTE — Assessment & Plan Note (Signed)
SCr 2.0- GFR 44 Aug 04 2018

## 2018-08-07 NOTE — Telephone Encounter (Signed)
Patient encounter closed , patient was seen at office appointment 08/06/18. Patient did not return phone call

## 2018-08-13 ENCOUNTER — Encounter: Payer: Medicaid Other | Admitting: *Deleted

## 2018-08-13 ENCOUNTER — Encounter (HOSPITAL_COMMUNITY)
Admission: RE | Admit: 2018-08-13 | Discharge: 2018-08-13 | Disposition: A | Discharge status: Home / Self Care | Payer: Medicaid Other | Source: Ambulatory Visit | Attending: Internal Medicine | Admitting: Internal Medicine

## 2018-08-13 VITALS — BP 128/77 | HR 81

## 2018-08-13 DIAGNOSIS — Z006 Encounter for examination for normal comparison and control in clinical research program: Secondary | ICD-10-CM

## 2018-08-13 MED ORDER — STUDY - AEGIS II STUDY - PLACEBO OR CSL112 (PI-HILTY)
170.0000 mL | INTRAVENOUS | Status: DC
  Administered 2018-08-13: 170 mL via INTRAVENOUS
  Filled 2018-08-13: qty 170

## 2018-08-13 NOTE — Research (Signed)
AEGIS visit 4  Pt states he had some diarrhea after infusion on the next day, pt said he had infusion went and ate burgers and the next day he had some diarrhea, lasted a couple of days. Will monitor pt after this infusion for any symptoms. No complaints of chest pains. He is still battling the shortness of breath, gets better, but when talking a lot he can get SOB, and more so at night when lying flat. He takes his lasix everyday. Will continue to monitor pt for any other symptoms.                                      "CONSENT"   YES     NO   Continuing further Investigational Product and study visits for follow-up? [x]  []   Continuing consent from future biomedical research [x]  []                                      "EVENTS"    YES     NO  AE   (IF YES SEE SOURCE) [x]  []   SAE  (IF YES SEE SOURCE) []  [x]   ENDPOINT   (IF YES SEE SOURCE) []  [x]   REVASCULARIZATION  (IF YES SEE SOURCE) []  [x]   AMPUTATION   (IF YES SEE SOURCE) []  [x]   TROPONIN'S  (IF YES SEE SOURCE) []  [x]     Current Outpatient Medications:  .  acetaminophen (TYLENOL) 325 MG tablet, Take 1-2 tablets (325-650 mg total) by mouth every 6 (six) hours as needed for mild pain (pain score 1-3 or temp > 100.5)., Disp: , Rfl:  .  ALPRAZolam (XANAX) 0.25 MG tablet, Take 1 tablet (0.25 mg total) by mouth 3 (three) times daily as needed for anxiety., Disp: 30 tablet, Rfl: 0 .  aspirin EC 81 MG EC tablet, Take 1 tablet (81 mg total) by mouth daily., Disp: 90 tablet, Rfl: 3 .  atorvastatin (LIPITOR) 80 MG tablet, Take 1 tablet (80 mg total) by mouth daily at 6 PM., Disp: 90 tablet, Rfl: 3 .  blood glucose meter kit and supplies, Dispense based on patient and insurance preference. Use up to four times daily as directed. (FOR ICD-9 250.00, 250.01)., Disp: 1 each, Rfl: 0 .  docusate sodium (COLACE) 100 MG capsule, Take 100 mg by mouth as needed for mild constipation., Disp: , Rfl:  .  furosemide (LASIX) 40 MG tablet, Take 1 tablet (40 mg  total) by mouth daily., Disp: 90 tablet, Rfl: 3 .  hydrALAZINE (APRESOLINE) 50 MG tablet, Take 1 tablet (50 mg total) by mouth every 8 (eight) hours., Disp: 270 tablet, Rfl: 3 .  insulin aspart protamine - aspart (NOVOLOG MIX 70/30 FLEXPEN) (70-30) 100 UNIT/ML FlexPen, Inject 0.15 mLs (15 Units total) into the skin 2 (two) times daily with a meal., Disp: 15 mL, Rfl: 11 .  Insulin Pen Needle 31G X 5 MM MISC, Use daily to inject insulin as instructed, Disp: 100 each, Rfl: 2 .  isosorbide mononitrate (IMDUR) 30 MG 24 hr tablet, Take 1 tablet (30 mg total) by mouth daily., Disp: 90 tablet, Rfl: 3 .  methocarbamol (ROBAXIN) 500 MG tablet, Take 1 tablet (500 mg total) by mouth every 6 (six) hours as needed for muscle spasms., Disp: 60 tablet, Rfl: 0 .  metoprolol succinate (TOPROL-XL) 50 MG 24 hr  tablet, Take 1 tablet (50 mg total) by mouth daily. Take with or immediately following a meal., Disp: 90 tablet, Rfl: 3 .  Multiple Vitamin (MULTIVITAMIN WITH MINERALS) TABS tablet, Take 1 tablet by mouth daily., Disp: 90 tablet, Rfl: 3 .  sitaGLIPtin-metformin (JANUMET) 50-1000 MG tablet, Take 1 tablet by mouth 2 (two) times daily with a meal. (Patient taking differently: Take 1 tablet by mouth daily. ), Disp: 60 tablet, Rfl: 3 .  ticagrelor (BRILINTA) 90 MG TABS tablet, Take 1 tablet (90 mg total) by mouth 2 (two) times daily., Disp: 60 tablet, Rfl: 0 .  traMADol (ULTRAM) 50 MG tablet, Take 50 mg by mouth every 6 (six) hours as needed for moderate pain., Disp: , Rfl:  .  gabapentin (NEURONTIN) 100 MG capsule, Take 1 capsule (100 mg total) by mouth at bedtime. (Patient not taking: Reported on 08/13/2018), Disp: 30 capsule, Rfl: 1 No current facility-administered medications for this visit.   Facility-Administered Medications Ordered in Other Visits:  .  AEGIS II Study - CSL112 / placebo (PI: Hilty), 170 mL, Intravenous, Q7 days, Hilty, Nadean Corwin, MD, Last Rate: 85 mL/hr at 08/13/18 1049, 170 mL at 08/13/18  1049

## 2018-08-20 ENCOUNTER — Ambulatory Visit (HOSPITAL_COMMUNITY)
Admission: RE | Admit: 2018-08-20 | Discharge: 2018-08-20 | Disposition: A | Discharge status: Home / Self Care | Payer: Medicaid Other | Source: Ambulatory Visit | Attending: Internal Medicine | Admitting: Internal Medicine

## 2018-08-20 ENCOUNTER — Encounter: Payer: Medicaid Other | Admitting: *Deleted

## 2018-08-20 ENCOUNTER — Inpatient Hospital Stay (HOSPITAL_COMMUNITY)
Admission: RE | Admit: 2018-08-20 | Discharge: 2018-08-20 | Disposition: A | Discharge status: Home / Self Care | Payer: Medicaid Other | Source: Ambulatory Visit | Attending: Internal Medicine | Admitting: Internal Medicine

## 2018-08-20 VITALS — BP 140/83 | HR 90

## 2018-08-20 DIAGNOSIS — Z006 Encounter for examination for normal comparison and control in clinical research program: Secondary | ICD-10-CM

## 2018-08-20 MED ORDER — STUDY - AEGIS II STUDY - PLACEBO OR CSL112 (PI-HILTY)
170.0000 mL | INTRAVENOUS | Status: DC
  Administered 2018-08-20: 170 mL via INTRAVENOUS
  Filled 2018-08-20: qty 170

## 2018-08-20 NOTE — Research (Signed)
Aegis Visit 5 infusion 4  Pt doing well, sob is ok he is dealing with it. Hoping to switch over to Plavix once 30 days is over, will contact Dr Stanford Breed about this. No chest pains. Pt stated that he was taking Hydralazine twice a day instead of three times a day, told him to take it 3 times a day as directed, he understood. Will see him next Thursday with Dr Lia Foyer for his visit 6.                                     "CONSENT"   YES     NO   Continuing further Investigational Product and study visits for follow-up? [x] []  Continuing consent from future biomedical research [x] []                                  "EVENTS"    YES     NO  AE   (IF YES SEE SOURCE) [] [x]  SAE  (IF YES SEE SOURCE) [] [x]  ENDPOINT   (IF YES SEE SOURCE) [] [x]  REVASCULARIZATION  (IF YES SEE SOURCE) [] [x]  AMPUTATION   (IF YES SEE SOURCE) [] [x]  TROPONIN'S  (IF YES SEE SOURCE) [] [x]    Current Outpatient Medications:  .  ALPRAZolam (XANAX) 0.25 MG tablet, Take 1 tablet (0.25 mg total) by mouth 3 (three) times daily as needed for anxiety., Disp: 30 tablet, Rfl: 0 .  aspirin EC 81 MG EC tablet, Take 1 tablet (81 mg total) by mouth daily., Disp: 90 tablet, Rfl: 3 .  atorvastatin (LIPITOR) 80 MG tablet, Take 1 tablet (80 mg total) by mouth daily at 6 PM., Disp: 90 tablet, Rfl: 3 .  blood glucose meter kit and supplies, Dispense based on patient and insurance preference. Use up to four times daily as directed. (FOR ICD-9 250.00, 250.01)., Disp: 1 each, Rfl: 0 .  docusate sodium (COLACE) 100 MG capsule, Take 100 mg by mouth as needed for mild constipation., Disp: , Rfl:  .  furosemide (LASIX) 40 MG tablet, Take 1 tablet (40 mg total) by mouth daily., Disp: 90 tablet, Rfl: 3 .  hydrALAZINE (APRESOLINE) 50 MG tablet, Take 1 tablet (50 mg total) by mouth every 8 (eight) hours., Disp: 270 tablet, Rfl: 3 .  insulin aspart protamine - aspart (NOVOLOG MIX 70/30 FLEXPEN) (70-30) 100 UNIT/ML FlexPen, Inject 0.15 mLs (15  Units total) into the skin 2 (two) times daily with a meal., Disp: 15 mL, Rfl: 11 .  Insulin Pen Needle 31G X 5 MM MISC, Use daily to inject insulin as instructed, Disp: 100 each, Rfl: 2 .  isosorbide mononitrate (IMDUR) 30 MG 24 hr tablet, Take 1 tablet (30 mg total) by mouth daily., Disp: 90 tablet, Rfl: 3 .  methocarbamol (ROBAXIN) 500 MG tablet, Take 1 tablet (500 mg total) by mouth every 6 (six) hours as needed for muscle spasms., Disp: 60 tablet, Rfl: 0 .  metoprolol succinate (TOPROL-XL) 50 MG 24 hr tablet, Take 1 tablet (50 mg total) by mouth daily. Take with or immediately following a meal., Disp: 90 tablet, Rfl: 3 .  Multiple Vitamin (MULTIVITAMIN WITH MINERALS) TABS tablet, Take 1 tablet by mouth daily., Disp: 90 tablet, Rfl: 3 .  sitaGLIPtin-metformin (JANUMET) 50-1000 MG tablet, Take 1 tablet by mouth  2 (two) times daily with a meal. (Patient taking differently: Take 1 tablet by mouth daily. ), Disp: 60 tablet, Rfl: 3 .  ticagrelor (BRILINTA) 90 MG TABS tablet, Take 1 tablet (90 mg total) by mouth 2 (two) times daily., Disp: 60 tablet, Rfl: 0 .  traMADol (ULTRAM) 50 MG tablet, Take 50 mg by mouth every 6 (six) hours as needed for moderate pain., Disp: , Rfl:  .  acetaminophen (TYLENOL) 325 MG tablet, Take 1-2 tablets (325-650 mg total) by mouth every 6 (six) hours as needed for mild pain (pain score 1-3 or temp > 100.5). (Patient not taking: Reported on 08/20/2018), Disp: , Rfl:  .  gabapentin (NEURONTIN) 100 MG capsule, Take 1 capsule (100 mg total) by mouth at bedtime. (Patient not taking: Reported on 08/13/2018), Disp: 30 capsule, Rfl: 1 No current facility-administered medications for this visit.   Facility-Administered Medications Ordered in Other Visits:  .  STUDY - AEGIS II - placebo or CSL112 (PI-Hilty), 170 mL, Intravenous, Q7 days, Hilty, Nadean Corwin, MD, Stopped at 08/20/18 1236

## 2018-08-25 ENCOUNTER — Telehealth (HOSPITAL_COMMUNITY): Payer: Self-pay

## 2018-08-25 NOTE — Telephone Encounter (Signed)
Called pt to see if he would like to proceed with scheduling for CR, he stated not at this time.   Closed referral

## 2018-08-26 ENCOUNTER — Encounter: Payer: Medicaid Other | Admitting: *Deleted

## 2018-08-26 VITALS — BP 125/74 | HR 70 | Resp 20 | Wt 220.0 lb

## 2018-08-26 DIAGNOSIS — Z006 Encounter for examination for normal comparison and control in clinical research program: Secondary | ICD-10-CM

## 2018-08-26 MED FILL — ALPRAZolam 0.25 MG TABS: 0.25 | 10 days supply | Qty: 30 | Fill #0

## 2018-08-26 NOTE — Research (Signed)
                                  "  CONSENT"   YES     NO   Continuing further Investigational Product and study visits for follow-up? [x]  []   Continuing consent from future biomedical research [x]  []                                      "EVENTS"    YES     NO  AE   (IF YES SEE SOURCE) []  [x]   SAE  (IF YES SEE SOURCE) []  [x]   ENDPOINT   (IF YES SEE SOURCE) []  [x]   REVASCULARIZATION  (IF YES SEE SOURCE) []  [x]   AMPUTATION   (IF YES SEE SOURCE) []  [x]   TROPONIN'S  (IF YES SEE SOURCE) []  [x]     Labs:

## 2018-08-26 NOTE — Research (Signed)
PK from visit 2

## 2018-08-26 NOTE — Research (Signed)
Visit 6  PE with [facility] lab work  VS  Meds   Current Outpatient Medications:  .  ALPRAZolam (XANAX) 0.25 MG tablet, Take 1 tablet (0.25 mg total) by mouth 3 (three) times daily as needed for anxiety., Disp: 30 tablet, Rfl: 0 .  aspirin EC 81 MG EC tablet, Take 1 tablet (81 mg total) by mouth daily., Disp: 90 tablet, Rfl: 3 .  atorvastatin (LIPITOR) 80 MG tablet, Take 1 tablet (80 mg total) by mouth daily at 6 PM., Disp: 90 tablet, Rfl: 3 .  furosemide (LASIX) 40 MG tablet, Take 1 tablet (40 mg total) by mouth daily., Disp: 90 tablet, Rfl: 3 .  hydrALAZINE (APRESOLINE) 50 MG tablet, Take 1 tablet (50 mg total) by mouth every 8 (eight) hours., Disp: 270 tablet, Rfl: 3 .  insulin aspart protamine - aspart (NOVOLOG MIX 70/30 FLEXPEN) (70-30) 100 UNIT/ML FlexPen, Inject 0.15 mLs (15 Units total) into the skin 2 (two) times daily with a meal., Disp: 15 mL, Rfl: 11 .  isosorbide mononitrate (IMDUR) 30 MG 24 hr tablet, Take 1 tablet (30 mg total) by mouth daily., Disp: 90 tablet, Rfl: 3 .  methocarbamol (ROBAXIN) 500 MG tablet, Take 1 tablet (500 mg total) by mouth every 6 (six) hours as needed for muscle spasms., Disp: 60 tablet, Rfl: 0 .  metoprolol succinate (TOPROL-XL) 50 MG 24 hr tablet, Take 1 tablet (50 mg total) by mouth daily. Take with or immediately following a meal., Disp: 90 tablet, Rfl: 3 .  Multiple Vitamin (MULTIVITAMIN WITH MINERALS) TABS tablet, Take 1 tablet by mouth daily., Disp: 90 tablet, Rfl: 3 .  sitaGLIPtin-metformin (JANUMET) 50-1000 MG tablet, Take 1 tablet by mouth 2 (two) times daily with a meal. (Patient taking differently: Take 1 tablet by mouth daily. ), Disp: 60 tablet, Rfl: 3 .  ticagrelor (BRILINTA) 90 MG TABS tablet, Take 1 tablet (90 mg total) by mouth 2 (two) times daily., Disp: 60 tablet, Rfl: 0 .  traMADol (ULTRAM) 50 MG tablet, Take 50 mg by mouth every 6 (six) hours as needed for moderate pain., Disp: , Rfl:  .  acetaminophen (TYLENOL) 325 MG tablet,  Take 1-2 tablets (325-650 mg total) by mouth every 6 (six) hours as needed for mild pain (pain score 1-3 or temp > 100.5). (Patient not taking: Reported on 08/20/2018), Disp: , Rfl:  .  blood glucose meter kit and supplies, Dispense based on patient and insurance preference. Use up to four times daily as directed. (FOR ICD-9 250.00, 250.01)., Disp: 1 each, Rfl: 0 .  docusate sodium (COLACE) 100 MG capsule, Take 100 mg by mouth as needed for mild constipation., Disp: , Rfl:  .  gabapentin (NEURONTIN) 100 MG capsule, Take 1 capsule (100 mg total) by mouth at bedtime. (Patient not taking: Reported on 08/26/2018), Disp: 30 capsule, Rfl: 1 .  Insulin Pen Needle 31G X 5 MM MISC, Use daily to inject insulin as instructed, Disp: 100 each, Rfl: 2   AE/SAE  .[id number]

## 2018-08-26 NOTE — Research (Signed)
Patient is stable and doing well.  Came briefly to ER without incident.  No current symptoms.  Tolerated protocol well.  Has slight SOB that he relates to Brilinta, but it has gotten better in the past couple of weeks.    BP 140/83. P90 Lungs clear Cardiac regular JVP flat. Abd soft without masses No edema (has AKA)  [place] Class I   Stable post infusions for AEGIS II protocol.  Protocol, purposes of study, and hoped outcomes reviewed with patient.   Pt at completion of study.   Loretha Brasil. Lia Foyer, MD, [facility], [place] Director, [facility]

## 2018-08-27 MED FILL — FUROSEMIDE 40 MG TAB: 40 | 30 days supply | Qty: 30 | Fill #1

## 2018-09-01 MED FILL — BRILINTA 90 MG TABLET: 90 | 30 days supply | Qty: 60 | Fill #1

## 2018-09-03 ENCOUNTER — Encounter (INDEPENDENT_AMBULATORY_CARE_PROVIDER_SITE_OTHER): Payer: Self-pay | Admitting: Physician Assistant

## 2018-09-03 ENCOUNTER — Ambulatory Visit (INDEPENDENT_AMBULATORY_CARE_PROVIDER_SITE_OTHER): Payer: Medicaid Other | Admitting: Physician Assistant

## 2018-09-03 VITALS — Ht 73.0 in | Wt 220.0 lb

## 2018-09-03 DIAGNOSIS — Z89432 Acquired absence of left foot: Secondary | ICD-10-CM

## 2018-09-03 DIAGNOSIS — Z89511 Acquired absence of right leg below knee: Secondary | ICD-10-CM

## 2018-09-03 DIAGNOSIS — N183 Chronic kidney disease, stage 3 unspecified: Secondary | ICD-10-CM

## 2018-09-03 DIAGNOSIS — E1142 Type 2 diabetes mellitus with diabetic polyneuropathy: Secondary | ICD-10-CM

## 2018-09-03 DIAGNOSIS — Z794 Long term (current) use of insulin: Secondary | ICD-10-CM

## 2018-09-03 MED FILL — METOPROLOL SUCCINATE ER 50: 50 | 30 days supply | Qty: 30 | Fill #1

## 2018-09-03 MED FILL — ATORVASTATIN CALCIUM 80 MG: 80 | 30 days supply | Qty: 30 | Fill #1

## 2018-09-03 MED FILL — ISOSORBIDE MN ER 30 MG TAB: 30 | 30 days supply | Qty: 30 | Fill #1

## 2018-09-03 NOTE — Progress Notes (Signed)
Office Visit Note   Patient: Edward [name]           Date of Birth: 1971-02-22           MRN: [id number] Visit Date: 09/03/2018              Requested by: [name], [address] [place],  [address] PCP: [name], MD  Chief Complaint  Patient presents with  . Right Leg - Routine Post Op    06/19/18 right BKA       HPI: The patient is a 48 year old gentleman who is seen for postoperative follow-up following a right transtibial amputation on 06/19/2018.  He is working with biotech for fabrication of a prosthetic.  He is wearing a stump shrinker stocking and a limb protector.  He had a recent heart attack and reports that he had to undergo stenting.  He should be able to obtain his prosthesis soon and needs therapy orders to begin physical therapy to work on amputee gait training.  Assessment & Plan: Visit Diagnoses:  1. Acquired absence of right lower extremity below knee ([place])   2. Type 2 diabetes mellitus with diabetic polyneuropathy, with long-term current use of insulin ([place])   3. S/P transmetatarsal amputation of foot, left ([place])   4. CKD (chronic kidney disease), stage III ([place])     Plan: Orders were initiated in epic to begin physical therapy at the neuro rehabilitation for a PT gait training.  The patient was instructed to continue to wear his stump shrinker stockings and once his prosthesis is obtained to gradually increase wearing time as instructed by his prosthesis and physical therapy.  He will follow-up here in 8 weeks or sooner should he have difficulties in the interim.  Follow-Up Instructions: Return in about 8 weeks (around 10/29/2018).   Ortho Exam  Patient is alert, oriented, no adenopathy, well-dressed, normal affect, normal respiratory effort. The right transtibial amputation site is well-healed and without signs of infection or cellulitis.  He has good knee extension 0 to 110 degrees of flexion.  There are no signs of breakdown.  His edema  is much improved. Imaging: No results found.   Labs: Lab Results  Component Value Date   HGBA1C 6.7 (H) 07/29/2018   HGBA1C 15.9 (H) 05/14/2018   HGBA1C 10.7 (H) 10/04/2016   ESRSEDRATE 128 (H) 06/15/2018   ESRSEDRATE 47 (H) 05/07/2016   CRP 215.2 (H) 06/15/2018   CRP 22.6 (H) 05/07/2016   REPTSTATUS 06/22/2018 FINAL 06/17/2018   GRAMSTAIN  06/17/2018    RARE WBC PRESENT, PREDOMINANTLY PMN FEW GRAM POSITIVE COCCI    CULT  06/17/2018    MODERATE VIRIDANS STREPTOCOCCUS FEW ENTEROCOCCUS FAECALIS MODERATE PREVOTELLA SPECIES BETA LACTAMASE POSITIVE    LABORGA ENTEROCOCCUS FAECALIS 06/17/2018     Lab Results  Component Value Date   ALBUMIN 3.3 (L) 08/04/2018   ALBUMIN 2.7 (L) 07/31/2018   ALBUMIN 2.1 (L) 06/23/2018    Body mass index is 29.03 kg/m.  Orders:  Orders Placed This Encounter  Procedures  . Ambulatory referral to Physical Therapy   No orders of the defined types were placed in this encounter.    Procedures: No procedures performed  Clinical Data: No additional findings.  ROS:  All other systems negative, except as noted in the HPI. Review of Systems  Objective: Vital Signs: Ht 6\' 1"  (1.854 m)   Wt 220 lb (99.8 kg)   BMI 29.03 kg/m   Specialty Comments:  No  specialty comments available.  PMFS History: Patient Active Problem List   Diagnosis Date Noted  . CAD S/P percutaneous coronary angioplasty 08/06/2018  . Cardiomyopathy ([place]) 08/06/2018  . PVD (peripheral vascular disease) ([place]) 08/06/2018  . Shortness of breath   . Acute on chronic combined systolic and diastolic CHF, NYHA class 2 ([place])   . Non-ST elevation (NSTEMI) myocardial infarction ([place]) 07/28/2018  . Respiratory failure with hypoxia ([place]) 07/28/2018  . Acute heart failure ([place]) 07/28/2018  . Hypertensive emergency 07/28/2018  . S/P BKA (below knee amputation) unilateral, right ([place]) 07/15/2018  . Hypoglycemia   . Anxiety about health   . Labile blood glucose   .  Essential hypertension   . History of transmetatarsal amputation of left foot ([place])   . Hypoalbuminemia due to protein-calorie malnutrition ([place])   . Stage 3 chronic kidney disease ([place])   . Poorly controlled type 2 diabetes mellitus with peripheral neuropathy ([place])   . Acute blood loss anemia   . Anemia of chronic disease   . Leukocytosis   . Tobacco abuse   . Hyperkalemia 06/20/2018  . Diabetic polyneuropathy associated with type 2 diabetes mellitus ([place])   . Severe protein-calorie malnutrition ([place])   . AKI (acute kidney injury) ([place]) 06/17/2018  . Normocytic anemia 06/17/2018  . DM2 (diabetes mellitus, type 2) ([place]) 05/14/2018  . Viral pharyngitis 01/14/2017  . Cigarette smoker 01/14/2017  . Avascular necrosis of hip, left ([place]) 10/11/2016  . Status post left hip replacement 10/11/2016  . S/P transmetatarsal amputation of foot, left ([place]) 07/25/2016  . Balance problem 07/25/2016  . Fournier's gangrene 08/16/2011  . Acute respiratory failure ([place]) 08/16/2011  . Septic shock(785.52) 08/16/2011  . Insulin dependent diabetes mellitus with complications ([place]) 67/61/9509   Past Medical History:  Diagnosis Date  . Anxiety    self reported  . Arthritis   . CHF (congestive heart failure) ([place])   . Coronary artery disease   . Depression    self reported  . Diabetes mellitus   . Hypertension     Family History  Problem Relation Age of Onset  . Diabetes Other     Past Surgical History:  Procedure Laterality Date  . AMPUTATION Left 05/10/2016   Procedure: Left Transmetatarsal Amputation;  Surgeon: [name], MD;  Location: [facility];  Service: Orthopedics;  Laterality: Left;  . AMPUTATION Right 05/16/2018   Procedure: PARTIAL RAY AMPUTATION FIFTH TOE RIGHT FOOT;  Surgeon: [name], DPM;  Location: [facility];  Service: Podiatry;  Laterality: Right;  . AMPUTATION Right 06/17/2018   Procedure: AMPUTATION 4th RAY Right Foot;  Surgeon: Trula Slade, DPM;  Location: [facility];   Service: Podiatry;  Laterality: Right;  . AMPUTATION Right 06/19/2018   Procedure: RIGHT BELOW KNEE AMPUTATION;  Surgeon: [name], MD;  Location: [facility];  Service: Orthopedics;  Laterality: Right;  . APPLICATION OF WOUND VAC Right 06/19/2018   Procedure: APPLICATION OF WOUND VAC;  Surgeon: [name], MD;  Location: [facility];  Service: Orthopedics;  Laterality: Right;  . CARDIAC CATHETERIZATION  07/29/2018  . CIRCUMCISION  09/02/2011   Procedure: CIRCUMCISION ADULT;  Surgeon: Molli Hazard, MD;  Location: [facility];  Service: Urology;  Laterality: N/A;  . CORONARY STENT INTERVENTION N/A 07/29/2018   Procedure: CORONARY STENT INTERVENTION;  Surgeon: Leonie Man, MD;  Location: [facility];  Service: Cardiovascular;  Laterality: N/A;  . I&D EXTREMITY  09/02/2011   Procedure: IRRIGATION AND DEBRIDEMENT EXTREMITY;  Surgeon: Theodoro Kos, DO;  Location: [facility];  Service: Plastics;  Laterality: N/A;  . INCISION AND DRAINAGE OF WOUND  08/12/2011   Procedure: IRRIGATION AND DEBRIDEMENT WOUND;  Surgeon: Zenovia Jarred, MD;  Location: [facility];  Service: General;;  perineum  . IRRIGATION AND DEBRIDEMENT ABSCESS  08/12/2011   Procedure: IRRIGATION AND DEBRIDEMENT ABSCESS;  Surgeon: Molli Hazard, MD;  Location: [facility];  Service: Urology;  Laterality: N/A;  irrigation and debridment perineum    . IRRIGATION AND DEBRIDEMENT ABSCESS  08/14/2011   Procedure: MINOR INCISION AND DRAINAGE OF ABSCESS;  Surgeon: Molli Hazard, MD;  Location: [facility];  Service: Urology;  Laterality: N/A;  Perineal Wound Debridement;Placement Bilateral Testicular Thigh Pouches  . LEFT HEART CATH AND CORONARY ANGIOGRAPHY N/A 07/29/2018   Procedure: LEFT HEART CATH AND CORONARY ANGIOGRAPHY;  Surgeon: Leonie Man, MD;  Location: [facility];  Service: Cardiovascular;  Laterality: N/A;  . TOTAL HIP ARTHROPLASTY Left 10/11/2016   Procedure: LEFT TOTAL HIP  ARTHROPLASTY ANTERIOR APPROACH;  Surgeon: Mcarthur Rossetti, MD;  Location: [facility];  Service: Orthopedics;  Laterality: Left;   Social History   Occupational History  . Occupation: Disabled  Tobacco Use  . Smoking status: Current Every Day Smoker    Types: Cigarettes  . Smokeless tobacco: Never Used  Substance and Sexual Activity  . Alcohol use: Yes    Alcohol/week: 0.0 standard drinks    Comment: has not had alcohol in a month  . Drug use: No  . Sexual activity: Not on file

## 2018-09-04 ENCOUNTER — Encounter (INDEPENDENT_AMBULATORY_CARE_PROVIDER_SITE_OTHER): Payer: Self-pay | Admitting: Physician Assistant

## 2018-09-04 NOTE — Research (Signed)
Labs from Visit 6

## 2018-09-04 NOTE — Addendum Note (Signed)
Encounter addended by: Leonarda Salon, RN on: 09/04/2018 10:44 AM  Actions taken: Charge Capture section accepted

## 2018-09-04 NOTE — Research (Signed)
Central PK

## 2018-09-09 ENCOUNTER — Telehealth (INDEPENDENT_AMBULATORY_CARE_PROVIDER_SITE_OTHER): Payer: Self-pay | Admitting: Orthopaedic Surgery

## 2018-09-09 ENCOUNTER — Ambulatory Visit (INDEPENDENT_AMBULATORY_CARE_PROVIDER_SITE_OTHER): Payer: Medicaid Other | Admitting: Orthopaedic Surgery

## 2018-09-09 NOTE — Telephone Encounter (Signed)
Patient's son called wanting to know if Dr. Ninfa Linden would be willing to call him with the results of the patient's MRI.  CB#[phone].  Thank you.

## 2018-09-09 NOTE — Telephone Encounter (Signed)
I unfortunately know nothing about this patient nor his MRI.  I see no records that we have ordered anything for him some not sure what I would be going over.

## 2018-09-09 NOTE — Telephone Encounter (Signed)
He was on your schedule this morning and didn't show

## 2018-09-09 NOTE — Telephone Encounter (Signed)
This is a Edward [name] patient

## 2018-09-11 ENCOUNTER — Encounter: Payer: Self-pay | Admitting: Physical Medicine & Rehabilitation

## 2018-09-11 ENCOUNTER — Encounter: Payer: Medicaid Other | Attending: Physical Medicine & Rehabilitation | Admitting: Physical Medicine & Rehabilitation

## 2018-09-11 VITALS — BP 118/65 | Resp 14 | Ht 73.0 in | Wt 220.0 lb

## 2018-09-11 DIAGNOSIS — R269 Unspecified abnormalities of gait and mobility: Secondary | ICD-10-CM | POA: Insufficient documentation

## 2018-09-11 DIAGNOSIS — E1142 Type 2 diabetes mellitus with diabetic polyneuropathy: Secondary | ICD-10-CM | POA: Diagnosis not present

## 2018-09-11 DIAGNOSIS — Z89511 Acquired absence of right leg below knee: Secondary | ICD-10-CM | POA: Diagnosis not present

## 2018-09-11 DIAGNOSIS — F17210 Nicotine dependence, cigarettes, uncomplicated: Secondary | ICD-10-CM | POA: Diagnosis not present

## 2018-09-11 DIAGNOSIS — Z89432 Acquired absence of left foot: Secondary | ICD-10-CM | POA: Diagnosis not present

## 2018-09-11 DIAGNOSIS — E1165 Type 2 diabetes mellitus with hyperglycemia: Secondary | ICD-10-CM

## 2018-09-11 DIAGNOSIS — I10 Essential (primary) hypertension: Secondary | ICD-10-CM | POA: Diagnosis not present

## 2018-09-11 DIAGNOSIS — M792 Neuralgia and neuritis, unspecified: Secondary | ICD-10-CM | POA: Diagnosis not present

## 2018-09-11 MED ORDER — DULOXETINE HCL 30 MG PO CPEP: 30.0000 mg | ORAL_CAPSULE | Freq: Every day | ORAL | 1 refills | Status: DC

## 2018-09-11 MED FILL — DULoxetine HCL 30 MG CPEP: 30 | 30 days supply | Qty: 30 | Fill #0

## 2018-09-11 NOTE — Progress Notes (Signed)
Subjective:    Patient ID: Edward [name], male    DOB: Dec 27, 1970, 48 y.o.   MRN: [id number]  HPI 48 year old right-handed male with history of diabetes mellitus, hypertension, tobacco abuse, left transmetatarsal amputation in 2017 presents for follow up for right BKA.   Last clinic visit 07/15/18.  Since that time, pt states he had a heart attack.  He states he is doing better, but is having more tingling now. He notes dry skin with Gabapentin. He is being fitted for prosthesis. BP is controlled. Denies falls. CBGs have been around 150.   Pain Inventory Average Pain 7 Pain Right Now 4 My pain is stabbing  In the last 24 hours, has pain interfered with the following? General activity 4 Relation with others 10 Enjoyment of life 5 What TIME of day is your pain at its worst? night Sleep (in general) Poor  Pain is worse with: sitting and unsure Pain improves with: medication Relief from Meds: .  Mobility walk with assistance use a walker ability to climb steps?  no do you drive?  no use a wheelchair transfers alone  Function disabled: date disabled . I need assistance with the following:  dressing, meal prep and household duties  Neuro/Psych weakness tingling trouble walking dizziness confusion depression anxiety  Prior Studies Any changes since last visit?  no  Physicians involved in your care Any changes since last visit?  no   Family History  Problem Relation Age of Onset  . Diabetes Other    Social History   Socioeconomic History  . Marital status: Widowed    Spouse name: Not on file  . Number of children: Not on file  . Years of education: Not on file  . Highest education level: Not on file  Occupational History  . Occupation: Disabled  Social Needs  . Financial resource strain: Not on file  . Food insecurity:    Worry: Not on file    Inability: Not on file  . Transportation needs:    Medical: Not on file    Non-medical: Not on file    Tobacco Use  . Smoking status: Current Every Day Smoker    Types: Cigarettes  . Smokeless tobacco: Never Used  Substance and Sexual Activity  . Alcohol use: Yes    Alcohol/week: 0.0 standard drinks    Comment: has not had alcohol in a month  . Drug use: No  . Sexual activity: Not on file  Lifestyle  . Physical activity:    Days per week: Not on file    Minutes per session: Not on file  . Stress: Not on file  Relationships  . Social connections:    Talks on phone: Not on file    Gets together: Not on file    Attends religious service: Not on file    Active member of club or organization: Not on file    Attends meetings of clubs or organizations: Not on file    Relationship status: Not on file  Other Topics Concern  . Not on file  Social History Narrative   Lives w/ his 2 sons.   Past Surgical History:  Procedure Laterality Date  . AMPUTATION Left 05/10/2016   Procedure: Left Transmetatarsal Amputation;  Surgeon: [name], MD;  Location: [place];  Service: Orthopedics;  Laterality: Left;  . AMPUTATION Right 05/16/2018   Procedure: PARTIAL RAY AMPUTATION FIFTH TOE RIGHT FOOT;  Surgeon: [name], DPM;  Location: [place];  Service: Podiatry;  Laterality: Right;  . AMPUTATION Right 06/17/2018   Procedure: AMPUTATION 4th RAY Right Foot;  Surgeon: Trula Slade, DPM;  Location: [place];  Service: Podiatry;  Laterality: Right;  . AMPUTATION Right 06/19/2018   Procedure: RIGHT BELOW KNEE AMPUTATION;  Surgeon: [name], MD;  Location: [place];  Service: Orthopedics;  Laterality: Right;  . APPLICATION OF WOUND VAC Right 06/19/2018   Procedure: APPLICATION OF WOUND VAC;  Surgeon: [name], MD;  Location: [place];  Service: Orthopedics;  Laterality: Right;  . CARDIAC CATHETERIZATION  07/29/2018  . CIRCUMCISION  09/02/2011   Procedure: CIRCUMCISION ADULT;  Surgeon: Molli Hazard, MD;  Location: [place];  Service: Urology;  Laterality: N/A;  .  CORONARY STENT INTERVENTION N/A 07/29/2018   Procedure: CORONARY STENT INTERVENTION;  Surgeon: Leonie Man, MD;  Location: [place] [facility];  Service: Cardiovascular;  Laterality: N/A;  . I&D EXTREMITY  09/02/2011   Procedure: IRRIGATION AND DEBRIDEMENT EXTREMITY;  Surgeon: Theodoro Kos, DO;  Location: [place];  Service: Plastics;  Laterality: N/A;  . INCISION AND DRAINAGE OF WOUND  08/12/2011   Procedure: IRRIGATION AND DEBRIDEMENT WOUND;  Surgeon: Zenovia Jarred, MD;  Location: [place];  Service: General;;  perineum  . IRRIGATION AND DEBRIDEMENT ABSCESS  08/12/2011   Procedure: IRRIGATION AND DEBRIDEMENT ABSCESS;  Surgeon: Molli Hazard, MD;  Location: [place];  Service: Urology;  Laterality: N/A;  irrigation and debridment perineum    . IRRIGATION AND DEBRIDEMENT ABSCESS  08/14/2011   Procedure: MINOR INCISION AND DRAINAGE OF ABSCESS;  Surgeon: Molli Hazard, MD;  Location: [place];  Service: Urology;  Laterality: N/A;  Perineal Wound Debridement;Placement Bilateral Testicular Thigh Pouches  . LEFT HEART CATH AND CORONARY ANGIOGRAPHY N/A 07/29/2018   Procedure: LEFT HEART CATH AND CORONARY ANGIOGRAPHY;  Surgeon: Leonie Man, MD;  Location: [place] [facility];  Service: Cardiovascular;  Laterality: N/A;  . TOTAL HIP ARTHROPLASTY Left 10/11/2016   Procedure: LEFT TOTAL HIP ARTHROPLASTY ANTERIOR APPROACH;  Surgeon: Mcarthur Rossetti, MD;  Location: [facility];  Service: Orthopedics;  Laterality: Left;   Past Medical History:  Diagnosis Date  . Anxiety    self reported  . Arthritis   . CHF (congestive heart failure) ([place])   . Coronary artery disease   . Depression    self reported  . Diabetes mellitus   . Hypertension    Resp 14   Ht 6\' 1"  (1.854 m)   Wt 220 lb (99.8 kg)   BMI 29.03 kg/m   Opioid Risk Score:   Fall Risk Score:  `1  Depression screen PHQ 2/9  Depression screen PHQ 2/9 07/15/2018  Decreased Interest 3  Down, Depressed,  Hopeless 3  PHQ - 2 Score 6  Altered sleeping 3  Tired, decreased energy 3  Change in appetite 2  Feeling bad or failure about yourself  1  Trouble concentrating 2  Moving slowly or fidgety/restless 1  Suicidal thoughts 0  PHQ-9 Score 18  Difficult doing work/chores Very difficult     Review of Systems  Constitutional: Positive for appetite change.  HENT: Negative.   Eyes: Negative.   Respiratory: Positive for apnea and wheezing.   Cardiovascular: Negative.   Gastrointestinal: Positive for abdominal pain, constipation, diarrhea, nausea and vomiting.  Endocrine: Negative.   Genitourinary: Negative.   Musculoskeletal: Positive for arthralgias, gait problem, joint swelling and myalgias.  Skin: Positive for rash.  Allergic/Immunologic: Negative.   Neurological: Positive  for dizziness and weakness.       Tingling  Hematological: Negative.   Psychiatric/Behavioral: Positive for confusion and dysphoric mood. The patient is nervous/anxious.   All other systems reviewed and are negative.      Objective:   Physical Exam Constitutional: Well-developed and well nourished. NAD. HENT: Normocephalic and atraumatic.  Eyes: EOMI. No discharge.  Cardiovascular: RRR. No JVD. Respiratory: Effort normal and breath sounds normal.  GI: Bowel sounds are normal. He exhibits no distension.  Musculoskeletal: Right BKA with tenderness.  Left transmet amputation, unchanged Neurological: He is alert and oriented.  Motor: B/l UE 5/5 proximal to distal LLE: 5/5 proximal to distal, stable RLE: HF 4+-5/5 proximal to distal, stable Skin: Left transmetatarsal amputation site healed.  Right BKA healing Psychiatric: He has a normal mood and affect. His behavior is normal. Thought content normal.     Assessment & Plan:  48 year old right-handed male with history of diabetes mellitus, hypertension, tobacco abuse, left transmetatarsal amputation in 2017 presents for follow up for right BKA.   1.  Decreased functional mobility secondary to right below-knee amputation 06/19/2018 as well as history of left transmetatarsal amputation 05/10/2016  Resume therapies after prosthesis   Cont follow up with Ortho  2. Pain Management: Neuropathic  Gabapentin d/ced due to side effects with skin  Will order Cymbalta 30mg  with food daily  Wean tramadol  3. Gait abnormality  Therapies after prosthesis  Cont wheelchair for safety

## 2018-09-16 ENCOUNTER — Ambulatory Visit: Payer: Medicaid Other | Admitting: Physical Medicine & Rehabilitation

## 2018-09-16 ENCOUNTER — Telehealth: Payer: Self-pay | Admitting: Podiatry

## 2018-09-16 NOTE — Telephone Encounter (Signed)
Delilah with [employer] at [facility] called to check on status of medical records request. Requested a call back at [phone].

## 2018-09-24 ENCOUNTER — Emergency Department (HOSPITAL_COMMUNITY): Payer: Medicaid Other

## 2018-09-24 ENCOUNTER — Encounter (HOSPITAL_COMMUNITY): Payer: Self-pay | Admitting: *Deleted

## 2018-09-24 ENCOUNTER — Other Ambulatory Visit: Payer: Self-pay

## 2018-09-24 ENCOUNTER — Emergency Department (HOSPITAL_COMMUNITY)
Admission: EM | Admit: 2018-09-24 | Discharge: 2018-09-24 | Disposition: A | Discharge status: Home / Self Care | Payer: Medicaid Other | Attending: Emergency Medicine | Admitting: Emergency Medicine

## 2018-09-24 DIAGNOSIS — I110 Hypertensive heart disease with heart failure: Secondary | ICD-10-CM | POA: Insufficient documentation

## 2018-09-24 DIAGNOSIS — E119 Type 2 diabetes mellitus without complications: Secondary | ICD-10-CM | POA: Insufficient documentation

## 2018-09-24 DIAGNOSIS — I2510 Atherosclerotic heart disease of native coronary artery without angina pectoris: Secondary | ICD-10-CM | POA: Insufficient documentation

## 2018-09-24 DIAGNOSIS — R0601 Orthopnea: Secondary | ICD-10-CM

## 2018-09-24 DIAGNOSIS — Z96642 Presence of left artificial hip joint: Secondary | ICD-10-CM | POA: Insufficient documentation

## 2018-09-24 DIAGNOSIS — R0602 Shortness of breath: Secondary | ICD-10-CM | POA: Diagnosis present

## 2018-09-24 DIAGNOSIS — I509 Heart failure, unspecified: Secondary | ICD-10-CM | POA: Diagnosis not present

## 2018-09-24 DIAGNOSIS — F17210 Nicotine dependence, cigarettes, uncomplicated: Secondary | ICD-10-CM | POA: Insufficient documentation

## 2018-09-24 LAB — CBC
HCT: 39.7 % (ref 39.0–52.0)
Hemoglobin: 12.7 g/dL — ABNORMAL LOW (ref 13.0–17.0)
MCH: 27.7 pg (ref 26.0–34.0)
MCHC: 32 g/dL (ref 30.0–36.0)
MCV: 86.5 fL (ref 80.0–100.0)
Platelets: 233 10*3/uL (ref 150–400)
RBC: 4.59 MIL/uL (ref 4.22–5.81)
RDW: 14.5 % (ref 11.5–15.5)
WBC: 6.7 10*3/uL (ref 4.0–10.5)
nRBC: 0 % (ref 0.0–0.2)

## 2018-09-24 LAB — TROPONIN I
TROPONIN I: 0.03 ng/mL — AB (ref <0.03)
Troponin I: 0.03 ng/mL (ref <0.03)

## 2018-09-24 LAB — BASIC METABOLIC PANEL
Anion gap: 12 (ref 5–15)
BUN: 27 mg/dL — AB (ref 6–20)
CO2: 18 mmol/L — ABNORMAL LOW (ref 22–32)
Calcium: 8.8 mg/dL — ABNORMAL LOW (ref 8.9–10.3)
Chloride: 109 mmol/L (ref 98–111)
Creatinine, Ser: 1.63 mg/dL — ABNORMAL HIGH (ref 0.61–1.24)
GFR calc Af Amer: 57 mL/min — ABNORMAL LOW (ref >60)
GFR calc non Af Amer: 49 mL/min — ABNORMAL LOW (ref >60)
Glucose, Bld: 209 mg/dL — ABNORMAL HIGH (ref 70–99)
Potassium: 4.3 mmol/L (ref 3.5–5.1)
Sodium: 139 mmol/L (ref 135–145)

## 2018-09-24 LAB — BRAIN NATRIURETIC PEPTIDE: B Natriuretic Peptide: 511.7 pg/mL — ABNORMAL HIGH (ref 0.0–100.0)

## 2018-09-24 MED ORDER — FUROSEMIDE 10 MG/ML IJ SOLN
40.0000 mg | Freq: Once | INTRAMUSCULAR | Status: AC
  Administered 2018-09-24: 40 mg via INTRAVENOUS
  Filled 2018-09-24: qty 4

## 2018-09-24 MED ORDER — SODIUM CHLORIDE 0.9% FLUSH: 3.0000 mL | Freq: Once | INTRAVENOUS | Status: DC

## 2018-09-24 MED ORDER — FUROSEMIDE 40 MG PO TABS: 40.0000 mg | ORAL_TABLET | Freq: Every day | ORAL | 1 refills | Status: DC

## 2018-09-24 MED FILL — hydrALAZINE HCL 50 MG TABS: 50 | 30 days supply | Qty: 90 | Fill #1

## 2018-09-24 MED FILL — FUROSEMIDE 40 MG TAB: 40 | 30 days supply | Qty: 30 | Fill #2

## 2018-09-24 NOTE — ED Notes (Signed)
Pt still has sob

## 2018-09-24 NOTE — ED Triage Notes (Signed)
The pt report sthat he has had some sob whenever he tries to lie down to sleep since yesterday. Sl abd paion

## 2018-09-24 NOTE — ED Provider Notes (Signed)
[facility] Emergency Department Provider Note MRN:  [id number]  Arrival date & time: 09/24/18     Chief Complaint   Shortness of breath History of Present Illness   Edward [name] is a 48 y.o. year-old male with a history of CAD status post stent placement, diabetes, CHF presenting to the ED with chief complaint of shortness of breath.  For the past 8 to 12 hours, patient has had increased shortness of breath that is worse when trying to lay flat.  Mild cough when laying flat.  Denies fever, denies chest pain, no abdominal pain, no dysuria, may be some increased swelling to the legs.  Taking his medications as directed.  Symptoms are constant, no other exacerbating relieving factors.  Some dyspnea on exertion.  Review of Systems  A complete 10 system review of systems was obtained and all systems are negative except as noted in the HPI and PMH.   Patient's Health History    Past Medical History:  Diagnosis Date  . Anxiety    self reported  . Arthritis   . CHF (congestive heart failure) ([place])   . Coronary artery disease   . Depression    self reported  . Diabetes mellitus   . Hypertension     Past Surgical History:  Procedure Laterality Date  . AMPUTATION Left 05/10/2016   Procedure: Left Transmetatarsal Amputation;  Surgeon: [name], MD;  Location: [place];  Service: Orthopedics;  Laterality: Left;  . AMPUTATION Right 05/16/2018   Procedure: PARTIAL RAY AMPUTATION FIFTH TOE RIGHT FOOT;  Surgeon: [name], DPM;  Location: [place];  Service: Podiatry;  Laterality: Right;  . AMPUTATION Right 06/17/2018   Procedure: AMPUTATION 4th RAY Right Foot;  Surgeon: Trula Slade, DPM;  Location: [place];  Service: Podiatry;  Laterality: Right;  . AMPUTATION Right 06/19/2018   Procedure: RIGHT BELOW KNEE AMPUTATION;  Surgeon: [name], MD;  Location: [place];  Service: Orthopedics;  Laterality: Right;  . APPLICATION OF WOUND VAC Right 06/19/2018   Procedure:  APPLICATION OF WOUND VAC;  Surgeon: [name], MD;  Location: [place];  Service: Orthopedics;  Laterality: Right;  . CARDIAC CATHETERIZATION  07/29/2018  . CIRCUMCISION  09/02/2011   Procedure: CIRCUMCISION ADULT;  Surgeon: Molli Hazard, MD;  Location: [place];  Service: Urology;  Laterality: N/A;  . CORONARY STENT INTERVENTION N/A 07/29/2018   Procedure: CORONARY STENT INTERVENTION;  Surgeon: Leonie Man, MD;  Location: [place] [facility];  Service: Cardiovascular;  Laterality: N/A;  . I&D EXTREMITY  09/02/2011   Procedure: IRRIGATION AND DEBRIDEMENT EXTREMITY;  Surgeon: Theodoro Kos, DO;  Location: [place];  Service: Plastics;  Laterality: N/A;  . INCISION AND DRAINAGE OF WOUND  08/12/2011   Procedure: IRRIGATION AND DEBRIDEMENT WOUND;  Surgeon: Zenovia Jarred, MD;  Location: [place];  Service: General;;  perineum  . IRRIGATION AND DEBRIDEMENT ABSCESS  08/12/2011   Procedure: IRRIGATION AND DEBRIDEMENT ABSCESS;  Surgeon: Molli Hazard, MD;  Location: [place];  Service: Urology;  Laterality: N/A;  irrigation and debridment perineum    . IRRIGATION AND DEBRIDEMENT ABSCESS  08/14/2011   Procedure: MINOR INCISION AND DRAINAGE OF ABSCESS;  Surgeon: Molli Hazard, MD;  Location: [place];  Service: Urology;  Laterality: N/A;  Perineal Wound Debridement;Placement Bilateral Testicular Thigh Pouches  . LEFT HEART CATH AND CORONARY ANGIOGRAPHY N/A 07/29/2018   Procedure: LEFT HEART CATH AND CORONARY ANGIOGRAPHY;  Surgeon:  Leonie Man, MD;  Location: [place] [facility];  Service: Cardiovascular;  Laterality: N/A;  . TOTAL HIP ARTHROPLASTY Left 10/11/2016   Procedure: LEFT TOTAL HIP ARTHROPLASTY ANTERIOR APPROACH;  Surgeon: Mcarthur Rossetti, MD;  [facility];  Service: Orthopedics;  Laterality: Left;    Family History  Problem Relation Age of Onset  . Diabetes Other     Social History   Socioeconomic History  . Marital  status: Widowed    Spouse name: Not on file  . Number of children: Not on file  . Years of education: Not on file  . Highest education level: Not on file  Occupational History  . Occupation: Disabled  Social Needs  . Financial resource strain: Not on file  . Food insecurity:    Worry: Not on file    Inability: Not on file  . Transportation needs:    Medical: Not on file    Non-medical: Not on file  Tobacco Use  . Smoking status: Current Every Day Smoker    Types: Cigarettes  . Smokeless tobacco: Never Used  Substance and Sexual Activity  . Alcohol use: Yes    Alcohol/week: 0.0 standard drinks    Comment: has not had alcohol in a month  . Drug use: No  . Sexual activity: Not on file  Lifestyle  . Physical activity:    Days per week: Not on file    Minutes per session: Not on file  . Stress: Not on file  Relationships  . Social connections:    Talks on phone: Not on file    Gets together: Not on file    Attends religious service: Not on file    Active member of club or organization: Not on file    Attends meetings of clubs or organizations: Not on file    Relationship status: Not on file  . Intimate partner violence:    Fear of current or ex partner: Not on file    Emotionally abused: Not on file    Physically abused: Not on file    Forced sexual activity: Not on file  Other Topics Concern  . Not on file  Social History Narrative   Lives w/ his 2 sons.     Physical Exam  Vital Signs and Nursing Notes reviewed Vitals:   09/24/18 0500 09/24/18 0502  BP: 136/83 124/86  Pulse: 76 76  Resp: 19 (!) 24  Temp:    SpO2: 94% 94%    CONSTITUTIONAL: Well-appearing, NAD NEURO:  Alert and oriented x 3, no focal deficits EYES:  eyes equal and reactive ENT/NECK:  no LAD, no JVD CARDIO: Regular rate, well-perfused, normal S1 and S2 PULM:  CTAB no wheezing or rhonchi GI/GU:  normal bowel sounds, non-distended, non-tender MSK/SPINE:  No gross deformities, below the knee  amputation on the right, 1+ pitting edema to the left lower extremity SKIN:  no rash, atraumatic PSYCH:  Appropriate speech and behavior  Diagnostic and Interventional Summary    EKG Interpretation  Date/Time:  Thursday September 24 2018 01:38:38 EST Ventricular Rate:  73 PR Interval:    QRS Duration: 91 QT Interval:  417 QTC Calculation: 460 R Axis:   3 Text Interpretation:  Sinus rhythm Prolonged PR interval Consider anterior infarct Minimal ST elevation, inferior leads Confirmed by Gerlene Fee [phone]) on 09/24/2018 1:42:11 AM      Labs Reviewed  BASIC METABOLIC PANEL - Abnormal; Notable for the following components:      Result Value  CO2 18 (*)    Glucose, Bld 209 (*)    BUN 27 (*)    Creatinine, Ser 1.63 (*)    Calcium 8.8 (*)    GFR calc non Af Amer 49 (*)    GFR calc Af Amer 57 (*)    All other components within normal limits  CBC - Abnormal; Notable for the following components:   Hemoglobin 12.7 (*)    All other components within normal limits  TROPONIN I - Abnormal; Notable for the following components:   Troponin I 0.03 (*)    All other components within normal limits  BRAIN NATRIURETIC PEPTIDE - Abnormal; Notable for the following components:   B Natriuretic Peptide 511.7 (*)    All other components within normal limits  TROPONIN I - Abnormal; Notable for the following components:   Troponin I 0.03 (*)    All other components within normal limits    DG Chest 2 View  Final Result      Medications  sodium chloride flush (NS) 0.9 % injection 3 mL (3 mLs Intravenous Not Given 09/24/18 0159)  furosemide (LASIX) injection 40 mg (40 mg Intravenous Given 09/24/18 0546)     Procedures Critical Care  ED Course and Medical Decision Making  I have reviewed the triage vital signs and the nursing notes.  Pertinent labs & imaging results that were available during my care of the patient were reviewed by me and considered in my medical decision making (see below for  details).  Orthopnea in this 48 year old male with recent stent placement 2 months ago, suspect CHF exacerbation, work-up pending.  BNP mildly elevated at about 500.  Minimal troponin elevation at 0.03, repeat at 2 hours is again 0.03.  Patient was given a 40 mg dose of IV Lasix with good urine output here in the emergency department.  Patient is feeling well, requesting discharge.  Patient explains that he ran out of his Lasix medication yesterday, and this is likely the reason for his worsening symptoms today.  Appropriate to restart this medication and see how he does as an outpatient, has good close follow-up with his regular prescribers.  After the discussed management above, the patient was determined to be safe for discharge.  The patient was in agreement with this plan and all questions regarding their care were answered.  ED return precautions were discussed and the patient will return to the ED with any significant worsening of condition.  Barth Kirks. Sedonia Small, MD [place] [email or web address]  Final Clinical Impressions(s) / ED Diagnoses     ICD-10-CM   1. Orthopnea R06.01     ED Discharge Orders         Ordered    furosemide (LASIX) 40 MG tablet  Daily     09/24/18 0705             Maudie Flakes, MD 09/24/18 [phone]

## 2018-09-24 NOTE — Discharge Instructions (Addendum)
You were evaluated in the Emergency Department and after careful evaluation, we did not find any emergent condition requiring admission or further testing in the hospital.  Your symptoms today seem to be due to excess fluid related to your heart failure.  This is likely related to running out of your Lasix medication.  Please fill the replacement prescription for Lasix and take as directed and follow-up closely with your normal doctors.  Please return to the Emergency Department if you experience any worsening of your condition.  We encourage you to follow up with a primary care provider.  Thank you for allowing [place] to be a part of your care.

## 2018-09-28 NOTE — Progress Notes (Signed)
HPI: Follow-up coronary artery disease.  Patient admitted December 2019 with CHF and non-ST elevation myocardial infarction.  Echocardiogram showed ejection fraction 40 to 45%, moderate left ventricular hypertrophy, mild aortic insufficiency, mild to moderate mitral regurgitation, mild left atrial enlargement and small pericardial effusion.  Cardiac catheterization revealed an 85% circumflex, 90% mid to distal LAD (distal LAD small vessel and not amenable to PCI) and 75% first marginal.  Patient had PCI of his left circumflex with drug-eluting stent.  Patient has been seen in the emergency room with shortness of breath since his initial event.  Most recently he was seen in February and given additional Lasix with improvement.  It was noted at that time that he had run out of his Lasix.  Since last seen he occasionally has mild dyspnea.  He denies chest pain, palpitations or syncope.  Current Outpatient Medications  Medication Sig Dispense Refill  . acetaminophen (TYLENOL) 325 MG tablet Take 1-2 tablets (325-650 mg total) by mouth every 6 (six) hours as needed for mild pain (pain score 1-3 or temp > 100.5).    Marland Kitchen ALPRAZolam (XANAX) 0.25 MG tablet Take 1 tablet (0.25 mg total) by mouth 3 (three) times daily as needed for anxiety. 30 tablet 0  . aspirin EC 81 MG EC tablet Take 1 tablet (81 mg total) by mouth daily. 90 tablet 3  . atorvastatin (LIPITOR) 80 MG tablet Take 1 tablet (80 mg total) by mouth daily at 6 PM. 90 tablet 3  . blood glucose meter kit and supplies Dispense based on patient and insurance preference. Use up to four times daily as directed. (FOR ICD-9 250.00, 250.01). 1 each 0  . docusate sodium (COLACE) 100 MG capsule Take 100 mg by mouth as needed for mild constipation.    . DULoxetine (CYMBALTA) 30 MG capsule Take 1 capsule (30 mg total) by mouth daily. 30 capsule 1  . furosemide (LASIX) 40 MG tablet Take 1 tablet (40 mg total) by mouth daily. 90 tablet 3  . furosemide (LASIX)  40 MG tablet Take 1 tablet (40 mg total) by mouth daily. 30 tablet 1  . hydrALAZINE (APRESOLINE) 50 MG tablet Take 1 tablet (50 mg total) by mouth every 8 (eight) hours. 270 tablet 3  . insulin aspart protamine - aspart (NOVOLOG MIX 70/30 FLEXPEN) (70-30) 100 UNIT/ML FlexPen Inject 0.15 mLs (15 Units total) into the skin 2 (two) times daily with a meal. 15 mL 11  . Insulin Pen Needle 31G X 5 MM MISC Use daily to inject insulin as instructed 100 each 2  . isosorbide mononitrate (IMDUR) 30 MG 24 hr tablet Take 1 tablet (30 mg total) by mouth daily. 90 tablet 3  . methocarbamol (ROBAXIN) 500 MG tablet Take 1 tablet (500 mg total) by mouth every 6 (six) hours as needed for muscle spasms. 60 tablet 0  . metoprolol succinate (TOPROL-XL) 50 MG 24 hr tablet Take 1 tablet (50 mg total) by mouth daily. Take with or immediately following a meal. 90 tablet 3  . Multiple Vitamin (MULTIVITAMIN WITH MINERALS) TABS tablet Take 1 tablet by mouth daily. 90 tablet 3  . sitaGLIPtin-metformin (JANUMET) 50-1000 MG tablet Take 1 tablet by mouth 2 (two) times daily with a meal. (Patient taking differently: Take 1 tablet by mouth daily. ) 60 tablet 3  . ticagrelor (BRILINTA) 90 MG TABS tablet Take 1 tablet (90 mg total) by mouth 2 (two) times daily. 60 tablet 0  . traMADol (ULTRAM) 50 MG tablet  Take 50 mg by mouth every 6 (six) hours as needed for moderate pain.     No current facility-administered medications for this visit.      Past Medical History:  Diagnosis Date  . Anxiety    self reported  . Arthritis   . CHF (congestive heart failure) ([place])   . Coronary artery disease   . Depression    self reported  . Diabetes mellitus   . Hypertension     Past Surgical History:  Procedure Laterality Date  . AMPUTATION Left 05/10/2016   Procedure: Left Transmetatarsal Amputation;  Surgeon: [name], MD;  Location: [facility];  Service: Orthopedics;  Laterality: Left;  . AMPUTATION Right 05/16/2018   Procedure:  PARTIAL RAY AMPUTATION FIFTH TOE RIGHT FOOT;  Surgeon: [name], DPM;  Location: [facility];  Service: Podiatry;  Laterality: Right;  . AMPUTATION Right 06/17/2018   Procedure: AMPUTATION 4th RAY Right Foot;  Surgeon: Trula Slade, DPM;  Location: [facility];  Service: Podiatry;  Laterality: Right;  . AMPUTATION Right 06/19/2018   Procedure: RIGHT BELOW KNEE AMPUTATION;  Surgeon: [name], MD;  Location: [facility];  Service: Orthopedics;  Laterality: Right;  . APPLICATION OF WOUND VAC Right 06/19/2018   Procedure: APPLICATION OF WOUND VAC;  Surgeon: [name], MD;  Location: [facility];  Service: Orthopedics;  Laterality: Right;  . CARDIAC CATHETERIZATION  07/29/2018  . CIRCUMCISION  09/02/2011   Procedure: CIRCUMCISION ADULT;  Surgeon: Molli Hazard, MD;  Location: [facility];  Service: Urology;  Laterality: N/A;  . CORONARY STENT INTERVENTION N/A 07/29/2018   Procedure: CORONARY STENT INTERVENTION;  Surgeon: Leonie Man, MD;  Location: [facility];  Service: Cardiovascular;  Laterality: N/A;  . I&D EXTREMITY  09/02/2011   Procedure: IRRIGATION AND DEBRIDEMENT EXTREMITY;  Surgeon: Theodoro Kos, DO;  Location: [facility];  Service: Plastics;  Laterality: N/A;  . INCISION AND DRAINAGE OF WOUND  08/12/2011   Procedure: IRRIGATION AND DEBRIDEMENT WOUND;  Surgeon: Zenovia Jarred, MD;  Location: [facility];  Service: General;;  perineum  . IRRIGATION AND DEBRIDEMENT ABSCESS  08/12/2011   Procedure: IRRIGATION AND DEBRIDEMENT ABSCESS;  Surgeon: Molli Hazard, MD;  Location: [facility];  Service: Urology;  Laterality: N/A;  irrigation and debridment perineum    . IRRIGATION AND DEBRIDEMENT ABSCESS  08/14/2011   Procedure: MINOR INCISION AND DRAINAGE OF ABSCESS;  Surgeon: Molli Hazard, MD;  Location: [facility];  Service: Urology;  Laterality: N/A;  Perineal Wound Debridement;Placement Bilateral Testicular Thigh Pouches  . LEFT HEART CATH AND  CORONARY ANGIOGRAPHY N/A 07/29/2018   Procedure: LEFT HEART CATH AND CORONARY ANGIOGRAPHY;  Surgeon: Leonie Man, MD;  Location: [facility];  Service: Cardiovascular;  Laterality: N/A;  . TOTAL HIP ARTHROPLASTY Left 10/11/2016   Procedure: LEFT TOTAL HIP ARTHROPLASTY ANTERIOR APPROACH;  Surgeon: Mcarthur Rossetti, MD;  [facility];  Service: Orthopedics;  Laterality: Left;    Social History   Socioeconomic History  . Marital status: Widowed    Spouse name: Not on file  . Number of children: Not on file  . Years of education: Not on file  . Highest education level: Not on file  Occupational History  . Occupation: Disabled  Social Needs  . Financial resource strain: Not on file  . Food insecurity:    Worry: Not on file    Inability: Not on file  . Transportation needs:  Medical: Not on file    Non-medical: Not on file  Tobacco Use  . Smoking status: Current Every Day Smoker    Types: Cigarettes  . Smokeless tobacco: Never Used  Substance and Sexual Activity  . Alcohol use: Yes    Alcohol/week: 0.0 standard drinks    Comment: has not had alcohol in a month  . Drug use: No  . Sexual activity: Not on file  Lifestyle  . Physical activity:    Days per week: Not on file    Minutes per session: Not on file  . Stress: Not on file  Relationships  . Social connections:    Talks on phone: Not on file    Gets together: Not on file    Attends religious service: Not on file    Active member of club or organization: Not on file    Attends meetings of clubs or organizations: Not on file    Relationship status: Not on file  . Intimate partner violence:    Fear of current or ex partner: Not on file    Emotionally abused: Not on file    Physically abused: Not on file    Forced sexual activity: Not on file  Other Topics Concern  . Not on file  Social History Narrative   Lives w/ his 2 sons.    Family History  Problem Relation Age of Onset  . Diabetes  Other     ROS: no fevers or chills, productive cough, hemoptysis, dysphasia, odynophagia, melena, hematochezia, dysuria, hematuria, rash, seizure activity, orthopnea, PND, pedal edema, claudication. Remaining systems are negative.  Physical Exam: Well-developed well-nourished in no acute distress.  Skin is warm and dry.  HEENT is normal.  Neck is supple.  Chest is clear to auscultation with normal expansion.  Cardiovascular exam is regular rate and rhythm.  Abdominal exam nontender or distended. No masses palpated. Extremities show no edema.  Status post right AKA neuro grossly intact  A/P  1 coronary artery disease-patient doing well with no recurrent chest pain.  Continue medical therapy with aspirin, Brilinta and Lipitor.  2 hyperlipidemia-continue statin.  Check lipids and liver.  3 chronic combined systolic/diastolic congestive heart failure-he appears to be euvolemic today.  Continue present dose of Lasix and follow.  4 chronic stage III kidney disease-follow renal function.  5 cardiomyopathy-felt secondary to coronary disease as well as hypertension.  Continue beta-blocker.  Continue nitrates.  I will add losartan 25 mg daily and advance as tolerated.  Check potassium and renal function in 4 days.  This would help with cardiomyopathy but also with renal protection given baseline renal insufficiency.  6 peripheral vascular disease-previous right BKA.  Continue aspirin and statin.  7 hypertension-blood pressure is elevated;   8 tobacco abuse-patient has been counseled on discontinuing.  9 diabetes mellitus-followed by primary care.  10 mitral regurgitation-he will need follow-up echocardiogram in the future.  Kirk Ruths, MD

## 2018-09-29 NOTE — Telephone Encounter (Signed)
MRI of lumbar spine done last year , so I think that's what he may be calling about.

## 2018-10-05 MED FILL — METOPROLOL SUCCINATE ER 50: 50 | 30 days supply | Qty: 30 | Fill #2 | Status: TO

## 2018-10-05 MED FILL — ISOSORBIDE MN ER 30 MG TAB: 30 | 30 days supply | Qty: 30 | Fill #2 | Status: TO

## 2018-10-08 ENCOUNTER — Encounter: Payer: Self-pay | Admitting: *Deleted

## 2018-10-08 DIAGNOSIS — Z006 Encounter for examination for normal comparison and control in clinical research program: Secondary | ICD-10-CM

## 2018-10-09 ENCOUNTER — Telehealth: Payer: Self-pay | Admitting: *Deleted

## 2018-10-09 ENCOUNTER — Encounter: Payer: Medicaid Other | Attending: Physical Medicine & Rehabilitation | Admitting: Physical Medicine & Rehabilitation

## 2018-10-09 DIAGNOSIS — Z89511 Acquired absence of right leg below knee: Secondary | ICD-10-CM | POA: Insufficient documentation

## 2018-10-09 DIAGNOSIS — R269 Unspecified abnormalities of gait and mobility: Secondary | ICD-10-CM | POA: Insufficient documentation

## 2018-10-09 DIAGNOSIS — F17210 Nicotine dependence, cigarettes, uncomplicated: Secondary | ICD-10-CM | POA: Insufficient documentation

## 2018-10-09 DIAGNOSIS — I10 Essential (primary) hypertension: Secondary | ICD-10-CM | POA: Insufficient documentation

## 2018-10-09 DIAGNOSIS — E1142 Type 2 diabetes mellitus with diabetic polyneuropathy: Secondary | ICD-10-CM | POA: Insufficient documentation

## 2018-10-09 NOTE — Telephone Encounter (Signed)
Spoke with patient on phone see encounter

## 2018-10-12 ENCOUNTER — Encounter: Payer: Self-pay | Admitting: Cardiology

## 2018-10-12 ENCOUNTER — Ambulatory Visit (INDEPENDENT_AMBULATORY_CARE_PROVIDER_SITE_OTHER): Payer: Medicaid Other | Admitting: Cardiology

## 2018-10-12 VITALS — BP 142/88 | HR 78 | Ht 73.0 in | Wt 232.6 lb

## 2018-10-12 DIAGNOSIS — E7800 Pure hypercholesterolemia, unspecified: Secondary | ICD-10-CM | POA: Diagnosis not present

## 2018-10-12 DIAGNOSIS — I2510 Atherosclerotic heart disease of native coronary artery without angina pectoris: Secondary | ICD-10-CM | POA: Diagnosis not present

## 2018-10-12 DIAGNOSIS — I5042 Chronic combined systolic (congestive) and diastolic (congestive) heart failure: Secondary | ICD-10-CM | POA: Diagnosis not present

## 2018-10-12 DIAGNOSIS — I10 Essential (primary) hypertension: Secondary | ICD-10-CM | POA: Diagnosis not present

## 2018-10-12 DIAGNOSIS — Z9861 Coronary angioplasty status: Secondary | ICD-10-CM

## 2018-10-12 MED ORDER — LOSARTAN POTASSIUM 25 MG PO TABS: 25.0000 mg | ORAL_TABLET | Freq: Every day | ORAL | 3 refills | Status: DC

## 2018-10-12 MED FILL — NOVOLOG MIX 70-30 FLEXPEN S: (70-30) 100 | 30 days supply | Qty: 9 | Fill #1

## 2018-10-12 MED FILL — BRILINTA 90 MG TABLET: 90 | 30 days supply | Qty: 60 | Fill #2 | Status: TO

## 2018-10-12 MED FILL — LOSARTAN POTASSIUM 25 MG TA: 25 | 30 days supply | Qty: 30 | Fill #0

## 2018-10-12 NOTE — Patient Instructions (Signed)
Medication Instructions:  START LOSARTAN 25 MG ONCE DAILY If you need a refill on your cardiac medications before your next appointment, please call your pharmacy.   Lab work: Your physician recommends that you return for lab work Friday [place] If you have labs (blood work) drawn today and your tests are completely normal, you will receive your results only by: Marland Kitchen MyChart Message (if you have MyChart) OR . A paper copy in the mail If you have any lab test that is abnormal or we need to change your treatment, we will call you to review the results.  Follow-Up: At [facility], you and your health needs are our priority.  As part of our continuing mission to provide you with exceptional heart care, we have created designated Provider Care Teams.  These Care Teams include your primary Cardiologist (physician) and Advanced Practice Providers (APPs -  Physician Assistants and Nurse Practitioners) who all work together to provide you with the care you need, when you need it. You will need a follow up appointment in 6 months.  Please call our office 2 months in advance to schedule this appointment.  You may see Kirk Ruths, MD or one of the following Advanced Practice Providers on your designated Care Team:   Kerin Ransom, PA-C Roby Lofts, [place] . Sande Rives, PA-C  CALL IN June TO SCHEDULE APPOINTMENT IN [place]

## 2018-10-12 NOTE — Research (Signed)
Visit 7  Pt doing well, no complaints of cp. He was seen in the ER in the beginning of Feb for shortness of breath. He had ran out of his lasix. Seeing Dr Stanford Breed on Monday.  Spoke with patient on Monday 10/12/2018 he is doing good, his visit with Dr Stanford Breed went well, he is adding a new medication.  Will see patient back in March for his visit 8.                                    "CONSENT"   YES     NO   Continuing further Investigational Product and study visits for follow-up? [x]  []   Continuing consent from future biomedical research [x]  []                                    "EVENTS"    YES     NO  AE   (IF YES SEE SOURCE) [x]  []   SAE  (IF YES SEE SOURCE) []  [x]   ENDPOINT   (IF YES SEE SOURCE) []  [x]   REVASCULARIZATION  (IF YES SEE SOURCE) []  [x]   AMPUTATION   (IF YES SEE SOURCE) []  [x]   TROPONIN'S  (IF YES SEE SOURCE) [x]  []    Updated list of his meds as of 10/12/2018  Current Outpatient Medications:  .  acetaminophen (TYLENOL) 325 MG tablet, Take 1-2 tablets (325-650 mg total) by mouth every 6 (six) hours as needed for mild pain (pain score 1-3 or temp > 100.5)., Disp: , Rfl:  .  ALPRAZolam (XANAX) 0.25 MG tablet, Take 1 tablet (0.25 mg total) by mouth 3 (three) times daily as needed for anxiety., Disp: 30 tablet, Rfl: 0 .  aspirin EC 81 MG EC tablet, Take 1 tablet (81 mg total) by mouth daily., Disp: 90 tablet, Rfl: 3 .  atorvastatin (LIPITOR) 80 MG tablet, Take 1 tablet (80 mg total) by mouth daily at 6 PM., Disp: 90 tablet, Rfl: 3 .  blood glucose meter kit and supplies, Dispense based on patient and insurance preference. Use up to four times daily as directed. (FOR ICD-9 250.00, 250.01)., Disp: 1 each, Rfl: 0 .  docusate sodium (COLACE) 100 MG capsule, Take 100 mg by mouth as needed for mild constipation., Disp: , Rfl:  .  DULoxetine (CYMBALTA) 30 MG capsule, Take 1 capsule (30 mg total) by mouth daily., Disp: 30 capsule, Rfl: 1 .  furosemide (LASIX) 40 MG tablet, Take 1 tablet  (40 mg total) by mouth daily., Disp: 90 tablet, Rfl: 3 .  hydrALAZINE (APRESOLINE) 50 MG tablet, Take 1 tablet (50 mg total) by mouth every 8 (eight) hours., Disp: 270 tablet, Rfl: 3 .  insulin aspart protamine - aspart (NOVOLOG MIX 70/30 FLEXPEN) (70-30) 100 UNIT/ML FlexPen, Inject 0.15 mLs (15 Units total) into the skin 2 (two) times daily with a meal., Disp: 15 mL, Rfl: 11 .  Insulin Pen Needle 31G X 5 MM MISC, Use daily to inject insulin as instructed, Disp: 100 each, Rfl: 2 .  losartan (COZAAR) 25 MG tablet, Take 1 tablet (25 mg total) by mouth daily., Disp: 90 tablet, Rfl: 3 .  methocarbamol (ROBAXIN) 500 MG tablet, Take 1 tablet (500 mg total) by mouth every 6 (six) hours as needed for muscle spasms., Disp: 60 tablet, Rfl: 0 .  metoprolol succinate (TOPROL-XL) 50 MG 24  hr tablet, Take 1 tablet (50 mg total) by mouth daily. Take with or immediately following a meal., Disp: 90 tablet, Rfl: 3 .  Multiple Vitamin (MULTIVITAMIN WITH MINERALS) TABS tablet, Take 1 tablet by mouth daily., Disp: 90 tablet, Rfl: 3 .  sitaGLIPtin-metformin (JANUMET) 50-1000 MG tablet, Take 1 tablet by mouth 2 (two) times daily with a meal. (Patient taking differently: Take 1 tablet by mouth daily. ), Disp: 60 tablet, Rfl: 3 .  ticagrelor (BRILINTA) 90 MG TABS tablet, Take 1 tablet (90 mg total) by mouth 2 (two) times daily., Disp: 60 tablet, Rfl: 0 .  traMADol (ULTRAM) 50 MG tablet, Take 50 mg by mouth every 6 (six) hours as needed for moderate pain., Disp: , Rfl:  .  isosorbide mononitrate (IMDUR) 30 MG 24 hr tablet, Take 1 tablet (30 mg total) by mouth daily., Disp: 90 tablet, Rfl: 3

## 2018-10-15 ENCOUNTER — Encounter: Payer: Self-pay | Admitting: Physical Therapy

## 2018-10-15 ENCOUNTER — Encounter (INDEPENDENT_AMBULATORY_CARE_PROVIDER_SITE_OTHER): Payer: Self-pay | Admitting: Physician Assistant

## 2018-10-15 ENCOUNTER — Ambulatory Visit (INDEPENDENT_AMBULATORY_CARE_PROVIDER_SITE_OTHER): Payer: Medicaid Other | Admitting: Physician Assistant

## 2018-10-15 ENCOUNTER — Other Ambulatory Visit: Payer: Self-pay

## 2018-10-15 ENCOUNTER — Ambulatory Visit: Payer: Medicaid Other | Attending: Physician Assistant | Admitting: Physical Therapy

## 2018-10-15 DIAGNOSIS — N183 Chronic kidney disease, stage 3 unspecified: Secondary | ICD-10-CM

## 2018-10-15 DIAGNOSIS — R293 Abnormal posture: Secondary | ICD-10-CM

## 2018-10-15 DIAGNOSIS — Z89511 Acquired absence of right leg below knee: Secondary | ICD-10-CM | POA: Diagnosis not present

## 2018-10-15 DIAGNOSIS — L97521 Non-pressure chronic ulcer of other part of left foot limited to breakdown of skin: Secondary | ICD-10-CM

## 2018-10-15 DIAGNOSIS — M6281 Muscle weakness (generalized): Secondary | ICD-10-CM | POA: Insufficient documentation

## 2018-10-15 DIAGNOSIS — E1142 Type 2 diabetes mellitus with diabetic polyneuropathy: Secondary | ICD-10-CM

## 2018-10-15 DIAGNOSIS — R2681 Unsteadiness on feet: Secondary | ICD-10-CM | POA: Insufficient documentation

## 2018-10-15 DIAGNOSIS — Z9181 History of falling: Secondary | ICD-10-CM | POA: Insufficient documentation

## 2018-10-15 DIAGNOSIS — G546 Phantom limb syndrome with pain: Secondary | ICD-10-CM | POA: Diagnosis present

## 2018-10-15 DIAGNOSIS — R2689 Other abnormalities of gait and mobility: Secondary | ICD-10-CM | POA: Diagnosis present

## 2018-10-15 DIAGNOSIS — M79662 Pain in left lower leg: Secondary | ICD-10-CM | POA: Insufficient documentation

## 2018-10-15 DIAGNOSIS — Z794 Long term (current) use of insulin: Secondary | ICD-10-CM

## 2018-10-15 MED ORDER — DOXYCYCLINE HYCLATE 100 MG PO CAPS: 100.0000 mg | ORAL_CAPSULE | Freq: Two times a day (BID) | ORAL | 1 refills | Status: DC

## 2018-10-15 MED FILL — DOXYCYCLINE HYC 100 MG CAPS: 100 | 14 days supply | Qty: 28 | Fill #0

## 2018-10-15 NOTE — Progress Notes (Signed)
Office Visit Note   Patient: Edward [name]           Date of Birth: 1970-12-12           MRN: [id number] Visit Date: 10/15/2018              Requested by: [name], [address] [place], [place] [address] PCP: [name], MD  Chief Complaint  Patient presents with  . Left Foot - Wound Check      HPI: The patient is a 48 year old gentleman with a history of insensate diabetic neuropathy and previous left foot transmetatarsal amputation and right below the knee amputation in November 2019 who presents with a new blister over his left foot.  He reports that he just noticed the blister this morning.  He reports it ruptured while he was in therapy and the physical therapist requested he come and see [place] today.  He reports diminished sensation over the residual left transmetatarsal amputation site.  He is wearing a regular sock and regular footwear.  Assessment & Plan: Visit Diagnoses:  1. Skin ulcer of left foot, limited to breakdown of skin ([place])   2. Acquired absence of right lower extremity below knee ([place])   3. Type 2 diabetes mellitus with diabetic polyneuropathy, with long-term current use of insulin ([place])   4. CKD (chronic kidney disease), stage III ([place])     Plan: Will start doxycycline 100 mg p.o. twice daily.  The patient was given a Vive compression stump shrinker to wear except for showering.  Recommend that he wash the area with Dial soap and water daily and then apply the compression shrinker.  He will likely need to hold off on doing much physical therapy until the ulcer of his foot is resolved.  He will follow-up here in 1 week or sooner should he have further difficulty in the interim. Follow-Up Instructions: Return in about 1 week (around 10/22/2018).   Ortho Exam  Patient is alert, oriented, no adenopathy, well-dressed, normal affect, normal respiratory effort. The left transmetatarsal amputation site has a new blistered area the area is approximately 3 x  5 x 2 0.0 x 0.1 cm.  There are no signs of cellulitis or infection in the foot he does have good palpable pedal pulses.  There is mild edema of the residual limb.    Imaging: No results found. No images are attached to the encounter.  Labs: Lab Results  Component Value Date   HGBA1C 6.7 (H) 07/29/2018   HGBA1C 15.9 (H) 05/14/2018   HGBA1C 10.7 (H) 10/04/2016   ESRSEDRATE 128 (H) 06/15/2018   ESRSEDRATE 47 (H) 05/07/2016   CRP 215.2 (H) 06/15/2018   CRP 22.6 (H) 05/07/2016   REPTSTATUS 06/22/2018 FINAL 06/17/2018   GRAMSTAIN  06/17/2018    RARE WBC PRESENT, PREDOMINANTLY PMN FEW GRAM POSITIVE COCCI    CULT  06/17/2018    MODERATE VIRIDANS STREPTOCOCCUS FEW ENTEROCOCCUS FAECALIS MODERATE PREVOTELLA SPECIES BETA LACTAMASE POSITIVE    LABORGA ENTEROCOCCUS FAECALIS 06/17/2018     Lab Results  Component Value Date   ALBUMIN 3.3 (L) 08/04/2018   ALBUMIN 2.7 (L) 07/31/2018   ALBUMIN 2.1 (L) 06/23/2018    There is no height or weight on file to calculate BMI.  Orders:  No orders of the defined types were placed in this encounter.  Meds ordered this encounter  Medications  . doxycycline (VIBRAMYCIN) 100 MG capsule    Sig: Take 1 capsule (100 mg total) by  mouth 2 (two) times daily.    Dispense:  28 capsule    Refill:  1     Procedures: No procedures performed  Clinical Data: No additional findings.  ROS:  All other systems negative, except as noted in the HPI. Review of Systems  Objective: Vital Signs: There were no vitals taken for this visit.  Specialty Comments:  No specialty comments available.  PMFS History: Patient Active Problem List   Diagnosis Date Noted  . Gait abnormality 09/11/2018  . CAD S/P percutaneous coronary angioplasty 08/06/2018  . Cardiomyopathy ([place]) 08/06/2018  . PVD (peripheral vascular disease) ([place]) 08/06/2018  . Shortness of breath   . Acute on chronic combined systolic and diastolic CHF, NYHA class 2 ([place])   . Non-ST  elevation (NSTEMI) myocardial infarction ([place]) 07/28/2018  . Respiratory failure with hypoxia ([place]) 07/28/2018  . Acute heart failure ([place]) 07/28/2018  . Hypertensive emergency 07/28/2018  . S/P BKA (below knee amputation) unilateral, right ([place]) 07/15/2018  . Hypoglycemia   . Anxiety about health   . Labile blood glucose   . Essential hypertension   . History of transmetatarsal amputation of left foot ([place])   . Hypoalbuminemia due to protein-calorie malnutrition ([place])   . Stage 3 chronic kidney disease ([place])   . Poorly controlled type 2 diabetes mellitus with peripheral neuropathy ([place])   . Acute blood loss anemia   . Anemia of chronic disease   . Leukocytosis   . Tobacco abuse   . Hyperkalemia 06/20/2018  . Diabetic polyneuropathy associated with type 2 diabetes mellitus ([place])   . Severe protein-calorie malnutrition ([place])   . AKI (acute kidney injury) ([place]) 06/17/2018  . Normocytic anemia 06/17/2018  . DM2 (diabetes mellitus, type 2) ([place]) 05/14/2018  . Viral pharyngitis 01/14/2017  . Cigarette smoker 01/14/2017  . Avascular necrosis of hip, left ([place]) 10/11/2016  . Status post left hip replacement 10/11/2016  . S/P transmetatarsal amputation of foot, left ([place]) 07/25/2016  . Balance problem 07/25/2016  . Fournier's gangrene 08/16/2011  . Acute respiratory failure ([place]) 08/16/2011  . Septic shock(785.52) 08/16/2011  . Insulin dependent diabetes mellitus with complications ([place]) 78/24/2353   Past Medical History:  Diagnosis Date  . Anxiety    self reported  . Arthritis   . CHF (congestive heart failure) ([place])   . Coronary artery disease   . Depression    self reported  . Diabetes mellitus   . Hypertension     Family History  Problem Relation Age of Onset  . Diabetes Other     Past Surgical History:  Procedure Laterality Date  . AMPUTATION Left 05/10/2016   Procedure: Left Transmetatarsal Amputation;  Surgeon: [name], MD;  Location: [facility];  Service:  Orthopedics;  Laterality: Left;  . AMPUTATION Right 05/16/2018   Procedure: PARTIAL RAY AMPUTATION FIFTH TOE RIGHT FOOT;  Surgeon: [name], DPM;  Location: [facility];  Service: Podiatry;  Laterality: Right;  . AMPUTATION Right 06/17/2018   Procedure: AMPUTATION 4th RAY Right Foot;  Surgeon: Trula Slade, DPM;  Location: [facility];  Service: Podiatry;  Laterality: Right;  . AMPUTATION Right 06/19/2018   Procedure: RIGHT BELOW KNEE AMPUTATION;  Surgeon: [name], MD;  Location: [facility];  Service: Orthopedics;  Laterality: Right;  . APPLICATION OF WOUND VAC Right 06/19/2018   Procedure: APPLICATION OF WOUND VAC;  Surgeon: [name], MD;  Location: [facility];  Service: Orthopedics;  Laterality: Right;  . CARDIAC CATHETERIZATION  07/29/2018  .  CIRCUMCISION  09/02/2011   Procedure: CIRCUMCISION ADULT;  Surgeon: Molli Hazard, MD;  Location: [facility];  Service: Urology;  Laterality: N/A;  . CORONARY STENT INTERVENTION N/A 07/29/2018   Procedure: CORONARY STENT INTERVENTION;  Surgeon: Leonie Man, MD;  Location: [facility];  Service: Cardiovascular;  Laterality: N/A;  . I&D EXTREMITY  09/02/2011   Procedure: IRRIGATION AND DEBRIDEMENT EXTREMITY;  Surgeon: Theodoro Kos, DO;  Location: [facility];  Service: Plastics;  Laterality: N/A;  . INCISION AND DRAINAGE OF WOUND  08/12/2011   Procedure: IRRIGATION AND DEBRIDEMENT WOUND;  Surgeon: Zenovia Jarred, MD;  Location: [facility];  Service: General;;  perineum  . IRRIGATION AND DEBRIDEMENT ABSCESS  08/12/2011   Procedure: IRRIGATION AND DEBRIDEMENT ABSCESS;  Surgeon: Molli Hazard, MD;  Location: [facility];  Service: Urology;  Laterality: N/A;  irrigation and debridment perineum    . IRRIGATION AND DEBRIDEMENT ABSCESS  08/14/2011   Procedure: MINOR INCISION AND DRAINAGE OF ABSCESS;  Surgeon: Molli Hazard, MD;  Location: [facility];  Service: Urology;  Laterality: N/A;  Perineal Wound  Debridement;Placement Bilateral Testicular Thigh Pouches  . LEFT HEART CATH AND CORONARY ANGIOGRAPHY N/A 07/29/2018   Procedure: LEFT HEART CATH AND CORONARY ANGIOGRAPHY;  Surgeon: Leonie Man, MD;  Location: [facility];  Service: Cardiovascular;  Laterality: N/A;  . TOTAL HIP ARTHROPLASTY Left 10/11/2016   Procedure: LEFT TOTAL HIP ARTHROPLASTY ANTERIOR APPROACH;  Surgeon: Mcarthur Rossetti, MD;  [facility];  Service: Orthopedics;  Laterality: Left;   Social History   Occupational History  . Occupation: Disabled  Tobacco Use  . Smoking status: Current Every Day Smoker    Types: Cigarettes  . Smokeless tobacco: Never Used  Substance and Sexual Activity  . Alcohol use: Yes    Alcohol/week: 0.0 standard drinks    Comment: has not had alcohol in a month  . Drug use: No  . Sexual activity: Not on file

## 2018-10-15 NOTE — Therapy (Signed)
[facility] [address] [place], [place], [address] Phone: [phone]   Fax:  [phone]  Physical Therapy Evaluation  Patient Details  Name: Edward [name] MRN: [id number] Date of Birth: 08/23/1970 Referring Provider (PT): [name], [place]   Encounter Date: 10/15/2018  PT End of Session - 10/15/18 1238    Visit Number  1    Number of Visits  16    Authorization Type  Medicaid    PT Start Time  1110    PT Stop Time  1155    PT Time Calculation (min)  45 min    Equipment Utilized During Treatment  Gait belt    Activity Tolerance  Patient tolerated treatment well    Behavior During Therapy  [facility] for tasks assessed/performed       Past Medical History:  Diagnosis Date  . Anxiety    self reported  . Arthritis   . CHF (congestive heart failure) ([place])   . Coronary artery disease   . Depression    self reported  . Diabetes mellitus   . Hypertension     Past Surgical History:  Procedure Laterality Date  . AMPUTATION Left 05/10/2016   Procedure: Left Transmetatarsal Amputation;  Surgeon: [name], MD;  Location: [facility];  Service: Orthopedics;  Laterality: Left;  . AMPUTATION Right 05/16/2018   Procedure: PARTIAL RAY AMPUTATION FIFTH TOE RIGHT FOOT;  Surgeon: [name], DPM;  Location: [place];  Service: Podiatry;  Laterality: Right;  . AMPUTATION Right 06/17/2018   Procedure: AMPUTATION 4th RAY Right Foot;  Surgeon: Trula Slade, DPM;  Location: [facility] [place];  Service: Podiatry;  Laterality: Right;  . AMPUTATION Right 06/19/2018   Procedure: RIGHT BELOW KNEE AMPUTATION;  Surgeon: [name], MD;  Location: [place];  Service: Orthopedics;  Laterality: Right;  . APPLICATION OF WOUND VAC Right 06/19/2018   Procedure: APPLICATION OF WOUND VAC;  Surgeon: [name], MD;  Location: [place];  Service: Orthopedics;  Laterality: Right;  . CARDIAC CATHETERIZATION  07/29/2018  . CIRCUMCISION  09/02/2011    Procedure: CIRCUMCISION ADULT;  Surgeon: Molli Hazard, MD;  Location: [facility];  Service: Urology;  Laterality: N/A;  . CORONARY STENT INTERVENTION N/A 07/29/2018   Procedure: CORONARY STENT INTERVENTION;  Surgeon: Leonie Man, MD;  Location: [place];  Service: Cardiovascular;  Laterality: N/A;  . I&D EXTREMITY  09/02/2011   Procedure: IRRIGATION AND DEBRIDEMENT EXTREMITY;  Surgeon: Theodoro Kos, DO;  Location: [facility] [place];  Service: Plastics;  Laterality: N/A;  . INCISION AND DRAINAGE OF WOUND  08/12/2011   Procedure: IRRIGATION AND DEBRIDEMENT WOUND;  Surgeon: Zenovia Jarred, MD;  Location: [place];  Service: General;;  perineum  . IRRIGATION AND DEBRIDEMENT ABSCESS  08/12/2011   Procedure: IRRIGATION AND DEBRIDEMENT ABSCESS;  Surgeon: Molli Hazard, MD;  Location: [place];  Service: Urology;  Laterality: N/A;  irrigation and debridment perineum    . IRRIGATION AND DEBRIDEMENT ABSCESS  08/14/2011   Procedure: MINOR INCISION AND DRAINAGE OF ABSCESS;  Surgeon: Molli Hazard, MD;  Location: [place];  Service: Urology;  Laterality: N/A;  Perineal Wound Debridement;Placement Bilateral Testicular Thigh Pouches  . LEFT HEART CATH AND CORONARY ANGIOGRAPHY N/A 07/29/2018   Procedure: LEFT HEART CATH AND CORONARY ANGIOGRAPHY;  Surgeon: Leonie Man, MD;  Location: [place] [facility];  Service: Cardiovascular;  Laterality: N/A;  . TOTAL HIP ARTHROPLASTY Left 10/11/2016  Procedure: LEFT TOTAL HIP ARTHROPLASTY ANTERIOR APPROACH;  Surgeon: Mcarthur Rossetti, MD;  Location: [facility];  Service: Orthopedics;  Laterality: Left;    There were no vitals filed for this visit.   Subjective Assessment - 10/15/18 1115    Subjective  This 48yo male was referred by [name], PA on 09/03/2018 with right Transtibial Amputation. He underwent a right Transtibial Amputation on 06/19/2018. He received prosthesis 09/29/2018.     Pertinent History  R  TTA, L THA 10/11/2016, L transmetarsal amp 2017, CAD, CHF, MI, arthritis, DM, HTN, CKD stage III    Limitations  Lifting;Walking;Standing;House hold activities    Patient Stated Goals  to use prosthesis for balance & walking.     Currently in Pain?  Yes    Pain Score  5    in last week, worst 8-9/10, best 4/10   Pain Location  Foot    Pain Orientation  Right;Left    Pain Descriptors / Indicators  Squeezing;Other (Comment);Pins and needles   pinching   Pain Type  Neuropathic pain    Pain Onset  More than a month ago    Pain Frequency  Constant    Aggravating Factors   unknown    Pain Relieving Factors  medications,    Multiple Pain Sites  Yes    Pain Score  5    Pain Location  Hip    Pain Orientation  Left    Pain Descriptors / Indicators  Aching    Pain Type  Acute pain    Pain Onset  More than a month ago    Pain Frequency  Intermittent    Aggravating Factors   hopping on walker    Pain Relieving Factors  sitting, ambulating with prosthesis instead of hopping         [facility] PT Assessment - 10/15/18 1110      Assessment   Medical Diagnosis  Right Transtibial Amputation    Referring Provider (PT)  [name], PA    Onset Date/Surgical Date  10/05/18   prosthesis delivery   Hand Dominance  Right    Prior Therapy  inpatient rehab & home health all in 2019      Precautions   Precautions  Fall      Balance Screen   Has the patient fallen in the past 6 months  Yes    How many times?  2-3   off balance   Has the patient had a decrease in activity level because of a fear of falling?   Yes    Is the patient reluctant to leave their home because of a fear of falling?   No      Home Environment   Living Environment  Private residence    [place]   22yo & 25yo sons   Type of [place] to enter    Entrance Stairs-Number of Steps  1 to porch & 1 into [place]  One level    Home  Equipment  Wheelchair - manual;Walker - 2 wheels;Cane - single point;Bedside commode;Tub bench      Prior Function   Level of Independence  Independent;Independent with household mobility with device;Independent with community mobility with device   used cane, handicap to building limited by neuropathy   Vocation  Unemployed    Leisure  DJ, cookout, fish,  Posture/Postural Control   Posture/Postural Control  Postural limitations    Postural Limitations  Rounded Shoulders;Forward head;Flexed trunk;Weight shift left      ROM / Strength   AROM / PROM / Strength  PROM;Strength      PROM   Overall PROM   Within functional limits for tasks performed      Strength   Overall Strength  Within functional limits for tasks performed      Transfers   Transfers  Sit to Stand;Stand to Sit    Sit to Stand  5: Supervision;With upper extremity assist;With armrests;From chair/3-in-1   has to touch RW for stabilization   Stand to Sit  5: Supervision;With upper extremity assist;With armrests;To chair/3-in-1;Uncontrolled descent   from RW     Ambulation/Gait   Ambulation/Gait  Yes    Ambulation/Gait Assistance  4: Min guard;5: Supervision    Ambulation/Gait Assistance Details  excessive UE weight bearing on RW with partial weight on prosthesis    Ambulation Distance (Feet)  75 Feet    Assistive device  Rolling walker;Prosthesis    Gait Pattern  Step-to pattern;Decreased step length - left;Decreased stance time - right;Decreased stride length;Decreased hip/knee flexion - right;Decreased weight shift to right;Right hip hike;Right circumduction;Right foot flat;Right flexed knee in stance;Antalgic;Lateral hip instability;Trunk flexed;Abducted- right    Ambulation Surface  Indoor;Level    Gait velocity  0.92 ft/sec      Standardized Balance Assessment   Standardized Balance Assessment  Berg Balance Test      Berg Balance Test   Sit to Stand  Needs minimal aid to stand or to stabilize    Standing  Unsupported  Needs several tries to stand 30 seconds unsupported    Sitting with Back Unsupported but Feet Supported on Floor or Stool  Able to sit safely and securely 2 minutes    Stand to Sit  Sits independently, has uncontrolled descent    Transfers  Able to transfer safely, definite need of hands    Standing Unsupported with Eyes Closed  Unable to keep eyes closed 3 seconds but stays steady    Standing Ubsupported with Feet Together  Needs help to attain position and unable to hold for 15 seconds    From Standing, Reach Forward with Outstretched Arm  Reaches forward but needs supervision    From Standing Position, Pick up Object from Floor  Unable to pick up and needs supervision    From Standing Position, Turn to Look Behind Over each Shoulder  Needs supervision when turning    Turn 360 Degrees  Needs assistance while turning    Standing Unsupported, Alternately Place Feet on Step/Stool  Needs assistance to keep from falling or unable to try    Standing Unsupported, One Foot in Front  Loses balance while stepping or standing    Standing on One Leg  Unable to try or needs assist to prevent fall    Total Score  14      Prosthetics Assessment - 10/15/18 1110      Prosthetics   Prosthetic Care Dependent with  Skin check;Residual limb care;Care of non-amputated limb;Prosthetic cleaning;Ply sock cleaning;Correct ply sock adjustment;Proper wear schedule/adjustment;Proper weight-bearing schedule/adjustment    Donning prosthesis   Supervision    Doffing prosthesis   Supervision    Current prosthetic wear tolerance (days/week)   daily since delivery 16 days ago   Prosthesis delivery 09/29/2018   Current prosthetic weight-bearing tolerance (hours/day)   Patient tolerated standing for 5 minutes with partial weight on  prosthesis without limb pain or discomfort    Edema  non-pitting    Residual limb condition   Left Transmetatarsal Ampuation with large blister that "popped" today per pt.  PT  scheduled appointment with [name], PA today at 1:00.  His right residual limb has 3 dry scabs with no signs of infection or drainage on incision line,  normal color, temperature & moisture, no hair growth    K code/activity level with prosthetic use   K2 basic community with fixed cadence               Objective measurements completed on examination: See above findings.      [facility] Adult PT Treatment/Exercise - 10/15/18 1110      Prosthetics   Prosthetic Care Comments   PT set up appointment with Shawn Rayburn, PA for assessment of left foot blister.  PT instructed to have prosthetist modify partial foot prosthesis to fit into current shoes.  Initiate wear 2 hrs 2x/day with plan to increase q5 days if no issues.     Current prosthetic wear tolerance (#hours/day)   20 minutes most days, but 6 hrs one day with discomfort next day   initiate at 2hrs 2x/day with plans to increase q5 days   Education Provided  Skin check;Residual limb care;Care of non-amputated limb;Prosthetic cleaning;Proper Donning;Proper wear schedule/adjustment;Other (comment)   see prosthetic care comments   Person(s) Educated  Patient    Education Method  Explanation;Demonstration;Tactile cues;Verbal cues    Education Method  Verbalized understanding;Returned demonstration;Tactile cues required;Verbal cues required;Needs further instruction             PT Education - 10/15/18 1145    Education Details  PT recommended limiting "hopping" on left hip (hx of THA) due to hip pain.     Person(s) Educated  Patient    Methods  Explanation    Comprehension  Verbalized understanding       PT Short Term Goals - 10/15/18 1300      PT SHORT TERM GOAL #1   Title  Patient tolerates wear prosthesis greater than 6 hours total per day without skin integrity problems.  (All STGs Target Date 3rd visit after Evalutation)    Baseline  Patient is wearing prosthesis daily for 20 minutes per day. Patient has 3 scabs on  incision.     Time  3    Period  Weeks    Status  New      PT SHORT TERM GOAL #2   Title  patient verbalizes proper cleaning of prosthesis and demonstrates proper donning     Baseline  patient is dependent in all aspects of prosthetic care including cleaning and donning     Time  3    Period  Weeks    Status  New      PT SHORT TERM GOAL #3   Title  patient ambulates 200 foot with a rolling Walker an prosthesis with supervision     Baseline  patient ambulates 75 feet with a rolling Walker with minimal assistance and gait deviations indicating fall risk     Time  3    Period  Weeks    Status  New      PT SHORT TERM GOAL #4   Title  patient negotiates ramps and curbs with the rolling Walker with supervision    Baseline  patient is dependent in technique to negotiate ramps and curbs with the prosthesis    Time  3  Period  Weeks    Status  New      PT SHORT TERM GOAL #5   Title  patient is able to perform standing ADLs with a rolling Walker & prosthesis including reaching 10" and picking up items off the floor safely     Baseline  patient is dependent in standing ADLs impairing safety & increasing fall risk    Time  3    Period  Weeks    Status  New        PT Long Term Goals - 10/15/18 1309      PT LONG TERM GOAL #1   Title  Patient is independent with all aspects of prosthetic care to enable safe utilization of the prosthesis (All LTGs Target Date 15th visit after PT evaluation)    Baseline  patient is dependent in all aspects of prosthetic care with risk of skin integrity issues     Time  9    Period  Weeks    Status  New      PT LONG TERM GOAL #2   Title  patient tolerates wear prosthesis daily for greater than 90% of awake hours without skin integrity problems or limb discomfort to enable function throughout his day     Baseline  patient is wearing prosthesis 20 minutes daily Ann has 3 scabs on residual limb     Time  9    Period  Weeks    Status  New      PT LONG  TERM GOAL #3   Title  Berg balance greater than 36 /56 to indicate lower fall risk and less dependency in standing ADLs     Baseline  Berg balance 14/56     Time  9    Period  Weeks    Status  New      PT LONG TERM GOAL #4   Title  patient ambulates 300 feet with a rolling Walker and prosthesis modified independent to enable community access     Baseline  patient ambulate 75 feet with rolling Walker in prosthesis with min assist    Time  9    Period  Weeks    Status  New      PT LONG TERM GOAL #5   Title  patient negotiates ramps & curbs with rolling Walker and stairs with one rail and the prosthesis modified independent to enable community access     Baseline  patient is dependent in negotiating ramps curbs and stairs with a prosthesis     Time  9    Period  Weeks    Status  New             Plan - 10/15/18 1241    Clinical Impression Statement  This 48 year old male received his prosthesis 16 days prior to physical therapy evaluation. He has worn the prosthesis daily for about 20 minutes at a time except one day attempted about 6 hours which were resulted in discomfort of residual limb. He is dependent on how to progress where to full day and without full day where he is limited in function mobility. He is dependent in prosthetic care limiting his ability to safely utilize the prosthesis. He has impaired balance with a Berg balance score of 14/56 indicating high risk for falls and dependency in standing ADLs. He ambulated with a rolling Walker with excessive weight bearing on the Walker and gait deviations indicating high risk of falls. His gait velocity is  0.92 feet per second also indicating high fall risk and dependency in gait. Patient has a transmetatarsal amputation of his left foot which now has a blister on it. Patient would benefit from skilled physical therapy to progress mobility and safety     History and Personal Factors relevant to plan of care:  R TTA, L THA 10/11/2016,  L transmetarsal amp 2017, CAD, CHF, MI, arthritis, DM, HTN, CKD stage III, lives with 2 sons who work outside home.     Clinical Presentation  Evolving    Clinical Presentation due to:  high fall risk, impaired balance, prosthetic dependency with skin issues, wound on Transmetatarsal Amputation    Clinical Decision Making  Moderate    Rehab Potential  Good    PT Frequency  2x / week   1x/wk for 3 weeks, then 2x/wk for 6 weeks   PT Duration  Other (comment)   9 weeks   PT Treatment/Interventions  ADLs/Self Care Home Management;DME Instruction;Gait training;Stair training;Functional mobility training;Canalith Repostioning;Therapeutic activities;Therapeutic exercise;Balance training;Neuromuscular re-education;Patient/family education;Prosthetic Training;Manual techniques;Vestibular    PT Next Visit Plan  review prosthetic care, instruct in prosthetic gait including stairs, ramps & curbs, check on left foot (saw Shawn Rayburn, PA for blister)    Consulted and Agree with Plan of Care  Patient       Patient will benefit from skilled therapeutic intervention in order to improve the following deficits and impairments:  Abnormal gait, Decreased activity tolerance, Decreased balance, Decreased endurance, Decreased knowledge of use of DME, Decreased mobility, Decreased skin integrity, Decreased strength, Prosthetic Dependency, Postural dysfunction, Pain  Visit Diagnosis: Other abnormalities of gait and mobility  Unsteadiness on feet  Muscle weakness (generalized)  Abnormal posture  History of falling  Pain in left lower leg  Phantom limb syndrome with pain [facility])     Problem List Patient Active Problem List   Diagnosis Date Noted  . Gait abnormality 09/11/2018  . CAD S/P percutaneous coronary angioplasty 08/06/2018  . Cardiomyopathy ([place]) 08/06/2018  . PVD (peripheral vascular disease) ([place]) 08/06/2018  . Shortness of breath   . Acute on chronic combined systolic and diastolic CHF,  NYHA class 2 ([place])   . Non-ST elevation (NSTEMI) myocardial infarction ([place]) 07/28/2018  . Respiratory failure with hypoxia ([place]) 07/28/2018  . Acute heart failure ([place]) 07/28/2018  . Hypertensive emergency 07/28/2018  . S/P BKA (below knee amputation) unilateral, right ([place]) 07/15/2018  . Hypoglycemia   . Anxiety about health   . Labile blood glucose   . Essential hypertension   . History of transmetatarsal amputation of left foot ([place])   . Hypoalbuminemia due to protein-calorie malnutrition ([place])   . Stage 3 chronic kidney disease ([place])   . Poorly controlled type 2 diabetes mellitus with peripheral neuropathy ([place])   . Acute blood loss anemia   . Anemia of chronic disease   . Leukocytosis   . Tobacco abuse   . Hyperkalemia 06/20/2018  . Diabetic polyneuropathy associated with type 2 diabetes mellitus ([place])   . Severe protein-calorie malnutrition ([place])   . AKI (acute kidney injury) ([place]) 06/17/2018  . Normocytic anemia 06/17/2018  . DM2 (diabetes mellitus, type 2) ([place]) 05/14/2018  . Viral pharyngitis 01/14/2017  . Cigarette smoker 01/14/2017  . Avascular necrosis of hip, left ([place]) 10/11/2016  . Status post left hip replacement 10/11/2016  . S/P transmetatarsal amputation of foot, left ([place]) 07/25/2016  . Balance problem 07/25/2016  . Fournier's gangrene 08/16/2011  . Acute respiratory failure ([place]) 08/16/2011  . Septic  shock(785.52) 08/16/2011  . Insulin dependent diabetes mellitus with complications ([place]) 52/48/1859    Vannak Montenegro PT, DPT 10/15/2018, 1:14 PM  [facility] [address] [place], [place], [address] Phone: [phone]   Fax:  [phone]  Name: Edward [name] MRN: [id number] Date of Birth: 01/25/1971

## 2018-10-16 ENCOUNTER — Encounter (INDEPENDENT_AMBULATORY_CARE_PROVIDER_SITE_OTHER): Payer: Self-pay | Admitting: Physician Assistant

## 2018-10-17 LAB — LIPID PANEL
CHOL/HDL RATIO: 4.4 ratio (ref 0.0–5.0)
Cholesterol, Total: 194 mg/dL (ref 100–199)
HDL: 44 mg/dL (ref >39)
LDL Calculated: 134 mg/dL — ABNORMAL HIGH (ref 0–99)
Triglycerides: 82 mg/dL (ref 0–149)
VLDL CHOLESTEROL CAL: 16 mg/dL (ref 5–40)

## 2018-10-17 LAB — COMPREHENSIVE METABOLIC PANEL
ALK PHOS: 52 IU/L (ref 39–117)
ALT: 11 IU/L (ref 0–44)
AST: 6 IU/L (ref 0–40)
Albumin/Globulin Ratio: 1.5 (ref 1.2–2.2)
Albumin: 3.8 g/dL — ABNORMAL LOW (ref 4.0–5.0)
BUN/Creatinine Ratio: 14 (ref 9–20)
BUN: 25 mg/dL — ABNORMAL HIGH (ref 6–24)
Bilirubin Total: 0.8 mg/dL (ref 0.0–1.2)
CO2: 20 mmol/L (ref 20–29)
Calcium: 9.2 mg/dL (ref 8.7–10.2)
Chloride: 106 mmol/L (ref 96–106)
Creatinine, Ser: 1.85 mg/dL — ABNORMAL HIGH (ref 0.76–1.27)
GFR calc Af Amer: 48 mL/min/{1.73_m2} — ABNORMAL LOW (ref >59)
GFR calc non Af Amer: 42 mL/min/{1.73_m2} — ABNORMAL LOW (ref >59)
Globulin, Total: 2.6 g/dL (ref 1.5–4.5)
Glucose: 131 mg/dL — ABNORMAL HIGH (ref 65–99)
Potassium: 4.3 mmol/L (ref 3.5–5.2)
Sodium: 142 mmol/L (ref 134–144)
Total Protein: 6.4 g/dL (ref 6.0–8.5)

## 2018-10-19 ENCOUNTER — Ambulatory Visit: Payer: Medicaid Other | Admitting: Physical Therapy

## 2018-10-21 ENCOUNTER — Telehealth: Payer: Self-pay | Admitting: *Deleted

## 2018-10-21 DIAGNOSIS — E7800 Pure hypercholesterolemia, unspecified: Secondary | ICD-10-CM

## 2018-10-21 MED ORDER — EZETIMIBE 10 MG PO TABS: 10.0000 mg | ORAL_TABLET | Freq: Every day | ORAL | 3 refills | Status: DC

## 2018-10-21 MED FILL — EZETIMIBE 10 MG TABS: 10 | 30 days supply | Qty: 30 | Fill #0

## 2018-10-21 MED FILL — FUROSEMIDE 40 MG TAB: 40 | 30 days supply | Qty: 30 | Fill #3 | Status: TO

## 2018-10-21 NOTE — Telephone Encounter (Signed)
Spoke with pt, aware of results. New script sent to the pharmacy and Lab orders mailed to the pt  

## 2018-10-21 NOTE — Telephone Encounter (Signed)
-----   Message from Lelon Perla, MD sent at 10/17/2018  7:16 AM EST ----- Add zetia 10 mg daily; lipids and liver 6 weeks [facility]

## 2018-10-22 ENCOUNTER — Encounter (INDEPENDENT_AMBULATORY_CARE_PROVIDER_SITE_OTHER): Payer: Self-pay | Admitting: Physician Assistant

## 2018-10-22 ENCOUNTER — Ambulatory Visit (INDEPENDENT_AMBULATORY_CARE_PROVIDER_SITE_OTHER): Payer: Medicaid Other | Admitting: Physician Assistant

## 2018-10-22 VITALS — Ht 73.0 in | Wt 232.6 lb

## 2018-10-22 DIAGNOSIS — N183 Chronic kidney disease, stage 3 unspecified: Secondary | ICD-10-CM

## 2018-10-22 DIAGNOSIS — E1142 Type 2 diabetes mellitus with diabetic polyneuropathy: Secondary | ICD-10-CM | POA: Diagnosis not present

## 2018-10-22 DIAGNOSIS — Z89511 Acquired absence of right leg below knee: Secondary | ICD-10-CM | POA: Diagnosis not present

## 2018-10-22 DIAGNOSIS — L97521 Non-pressure chronic ulcer of other part of left foot limited to breakdown of skin: Secondary | ICD-10-CM | POA: Diagnosis not present

## 2018-10-22 DIAGNOSIS — Z794 Long term (current) use of insulin: Secondary | ICD-10-CM

## 2018-10-22 DIAGNOSIS — Z89432 Acquired absence of left foot: Secondary | ICD-10-CM

## 2018-10-23 ENCOUNTER — Encounter (INDEPENDENT_AMBULATORY_CARE_PROVIDER_SITE_OTHER): Payer: Self-pay | Admitting: Physician Assistant

## 2018-10-23 NOTE — Progress Notes (Signed)
Office Visit Note   Patient: Edward [name]           Date of Birth: [birth year]           MRN: [id number] Visit Date: 10/22/2018              Requested by: [name], [address] [place], [place] [address] PCP: [name], MD  Chief Complaint  Patient presents with  . Left Foot - Follow-up      HPI: The patient is a 48 year old gentleman with a history of insensate diabetic neuropathy and a previous left foot transmetatarsal amputation and a right below the knee amputation in November 2019 who presents for follow-up of a new blister which is developed over his left foot. He has been utilizing a Mepilex border dressing to the area and avoiding weightbearing as much as possible.  The area is slowly improving.  He has been wearing a silver stump shrinker stocking on the left foot and this does seem to be helping as well.  Assessment & Plan: Visit Diagnoses:  1. Skin ulcer of left foot, limited to breakdown of skin ([place])   2. Acquired absence of right lower extremity below knee ([place])   3. Type 2 diabetes mellitus with diabetic polyneuropathy, with long-term current use of insulin ([place])   4. CKD (chronic kidney disease), stage III ([place])   5. S/P transmetatarsal amputation of foot, left ([place])     Plan: Counseled the patient to continue either a Mepilex border dressing to the area to for further healing or he can utilize his silver stump shrinker stocking to the transmetatarsal amputation site directly in contact with the skin to encourage healing.  He should continue to remain off of the area for at least another couple of weeks as much as possible.  He will follow-up here in 2 weeks.  Follow-Up Instructions: Return in about 2 weeks (around 11/05/2018).   Ortho Exam  Patient is alert, oriented, no adenopathy, well-dressed, normal affect, normal respiratory effort. The left foot transmetatarsal distal blister is is improved with increased beefy red tissue present good  palpable pedal pulse.  No signs of cellulitis or infection currently.  Imaging: No results found.   Labs: Lab Results  Component Value Date   HGBA1C 6.7 (H) 07/29/2018   HGBA1C 15.9 (H) 05/14/2018   HGBA1C 10.7 (H) 10/04/2016   ESRSEDRATE 128 (H) 06/15/2018   ESRSEDRATE 47 (H) 05/07/2016   CRP 215.2 (H) 06/15/2018   CRP 22.6 (H) 05/07/2016   REPTSTATUS 06/22/2018 FINAL 06/17/2018   GRAMSTAIN  06/17/2018    RARE WBC PRESENT, PREDOMINANTLY PMN FEW GRAM POSITIVE COCCI    CULT  06/17/2018    MODERATE VIRIDANS STREPTOCOCCUS FEW ENTEROCOCCUS FAECALIS MODERATE PREVOTELLA SPECIES BETA LACTAMASE POSITIVE    LABORGA ENTEROCOCCUS FAECALIS 06/17/2018     Lab Results  Component Value Date   ALBUMIN 3.8 (L) 10/16/2018   ALBUMIN 3.3 (L) 08/04/2018   ALBUMIN 2.7 (L) 07/31/2018    Body mass index is 30.69 kg/m.  Orders:  No orders of the defined types were placed in this encounter.  No orders of the defined types were placed in this encounter.    Procedures: No procedures performed  Clinical Data: No additional findings.  ROS:  All other systems negative, except as noted in the HPI. Review of Systems  Objective: Vital Signs: Ht 6\' 1"  (1.854 m)   Wt 232 lb 9.6 oz (105.5 kg)   BMI 30.69 kg/m  Specialty Comments:  No specialty comments available.  PMFS History: Patient Active Problem List   Diagnosis Date Noted  . Gait abnormality 09/11/2018  . CAD S/P percutaneous coronary angioplasty 08/06/2018  . Cardiomyopathy ([place]) 08/06/2018  . PVD (peripheral vascular disease) ([place]) 08/06/2018  . Shortness of breath   . Acute on chronic combined systolic and diastolic CHF, NYHA class 2 ([place])   . Non-ST elevation (NSTEMI) myocardial infarction ([place]) 07/28/2018  . Respiratory failure with hypoxia ([place]) 07/28/2018  . Acute heart failure ([place]) 07/28/2018  . Hypertensive emergency 07/28/2018  . S/P BKA (below knee amputation) unilateral, right ([place]) 07/15/2018  .  Hypoglycemia   . Anxiety about health   . Labile blood glucose   . Essential hypertension   . History of transmetatarsal amputation of left foot ([place])   . Hypoalbuminemia due to protein-calorie malnutrition ([place])   . Stage 3 chronic kidney disease ([place])   . Poorly controlled type 2 diabetes mellitus with peripheral neuropathy ([place])   . Acute blood loss anemia   . Anemia of chronic disease   . Leukocytosis   . Tobacco abuse   . Hyperkalemia 06/20/2018  . Diabetic polyneuropathy associated with type 2 diabetes mellitus ([place])   . Severe protein-calorie malnutrition ([place])   . AKI (acute kidney injury) ([place]) 06/17/2018  . Normocytic anemia 06/17/2018  . DM2 (diabetes mellitus, type 2) ([place]) 05/14/2018  . Viral pharyngitis 01/14/2017  . Cigarette smoker 01/14/2017  . Avascular necrosis of hip, left ([place]) 10/11/2016  . Status post left hip replacement 10/11/2016  . S/P transmetatarsal amputation of foot, left ([place]) 07/25/2016  . Balance problem 07/25/2016  . Fournier's gangrene 08/16/2011  . Acute respiratory failure ([employer]) 08/16/2011  . Septic shock(785.52) 08/16/2011  . Insulin dependent diabetes mellitus with complications ([place]) 94/70/9628   Past Medical History:  Diagnosis Date  . Anxiety    self reported  . Arthritis   . CHF (congestive heart failure) ([place])   . Coronary artery disease   . Depression    self reported  . Diabetes mellitus   . Hypertension     Family History  Problem Relation Age of Onset  . Diabetes Other     Past Surgical History:  Procedure Laterality Date  . AMPUTATION Left 05/10/2016   Procedure: Left Transmetatarsal Amputation;  Surgeon: [name], MD;  Location: [facility];  Service: Orthopedics;  Laterality: Left;  . AMPUTATION Right 05/16/2018   Procedure: PARTIAL RAY AMPUTATION FIFTH TOE RIGHT FOOT;  Surgeon: [name], DPM;  Location: [facility];  Service: Podiatry;  Laterality: Right;  . AMPUTATION Right 06/17/2018   Procedure: AMPUTATION 4th  RAY Right Foot;  Surgeon: Trula Slade, DPM;  Location: [facility];  Service: Podiatry;  Laterality: Right;  . AMPUTATION Right 06/19/2018   Procedure: RIGHT BELOW KNEE AMPUTATION;  Surgeon: [name], MD;  Location: [facility];  Service: Orthopedics;  Laterality: Right;  . APPLICATION OF WOUND VAC Right 06/19/2018   Procedure: APPLICATION OF WOUND VAC;  Surgeon: [name], MD;  Location: [facility];  Service: Orthopedics;  Laterality: Right;  . CARDIAC CATHETERIZATION  07/29/2018  . CIRCUMCISION  09/02/2011   Procedure: CIRCUMCISION ADULT;  Surgeon: Molli Hazard, MD;  Location: [facility];  Service: Urology;  Laterality: N/A;  . CORONARY STENT INTERVENTION N/A 07/29/2018   Procedure: CORONARY STENT INTERVENTION;  Surgeon: Leonie Man, MD;  Location: [facility];  Service: Cardiovascular;  Laterality: N/A;  . I&D EXTREMITY  09/02/2011   Procedure: IRRIGATION AND DEBRIDEMENT EXTREMITY;  Surgeon: Theodoro Kos, DO;  Location: [facility];  Service: Plastics;  Laterality: N/A;  . INCISION AND DRAINAGE OF WOUND  08/12/2011   Procedure: IRRIGATION AND DEBRIDEMENT WOUND;  Surgeon: Zenovia Jarred, MD;  Location: [facility];  Service: General;;  perineum  . IRRIGATION AND DEBRIDEMENT ABSCESS  08/12/2011   Procedure: IRRIGATION AND DEBRIDEMENT ABSCESS;  Surgeon: Molli Hazard, MD;  Location: [facility];  Service: Urology;  Laterality: N/A;  irrigation and debridment perineum    . IRRIGATION AND DEBRIDEMENT ABSCESS  08/14/2011   Procedure: MINOR INCISION AND DRAINAGE OF ABSCESS;  Surgeon: Molli Hazard, MD;  Location: [facility];  Service: Urology;  Laterality: N/A;  Perineal Wound Debridement;Placement Bilateral Testicular Thigh Pouches  . LEFT HEART CATH AND CORONARY ANGIOGRAPHY N/A 07/29/2018   Procedure: LEFT HEART CATH AND CORONARY ANGIOGRAPHY;  Surgeon: Leonie Man, MD;  Location: [facility];  Service: Cardiovascular;  Laterality: N/A;  .  TOTAL HIP ARTHROPLASTY Left 10/11/2016   Procedure: LEFT TOTAL HIP ARTHROPLASTY ANTERIOR APPROACH;  Surgeon: Mcarthur Rossetti, MD;  [facility];  Service: Orthopedics;  Laterality: Left;   Social History   Occupational History  . Occupation: Disabled  Tobacco Use  . Smoking status: Current Every Day Smoker    Types: Cigarettes  . Smokeless tobacco: Never Used  Substance and Sexual Activity  . Alcohol use: Yes    Alcohol/week: 0.0 standard drinks    Comment: has not had alcohol in a month  . Drug use: No  . Sexual activity: Not on file

## 2018-10-29 ENCOUNTER — Ambulatory Visit (INDEPENDENT_AMBULATORY_CARE_PROVIDER_SITE_OTHER): Payer: Medicaid Other | Admitting: Physician Assistant

## 2018-10-29 ENCOUNTER — Encounter (INDEPENDENT_AMBULATORY_CARE_PROVIDER_SITE_OTHER): Payer: Self-pay | Admitting: Orthopedic Surgery

## 2018-10-29 ENCOUNTER — Other Ambulatory Visit: Payer: Self-pay

## 2018-10-29 VITALS — Ht 73.0 in | Wt 232.6 lb

## 2018-10-29 DIAGNOSIS — Z89432 Acquired absence of left foot: Secondary | ICD-10-CM

## 2018-10-29 DIAGNOSIS — L97521 Non-pressure chronic ulcer of other part of left foot limited to breakdown of skin: Secondary | ICD-10-CM

## 2018-10-29 DIAGNOSIS — E1142 Type 2 diabetes mellitus with diabetic polyneuropathy: Secondary | ICD-10-CM

## 2018-10-29 DIAGNOSIS — N183 Chronic kidney disease, stage 3 unspecified: Secondary | ICD-10-CM

## 2018-10-29 DIAGNOSIS — Z794 Long term (current) use of insulin: Secondary | ICD-10-CM

## 2018-10-29 DIAGNOSIS — Z89511 Acquired absence of right leg below knee: Secondary | ICD-10-CM

## 2018-10-30 ENCOUNTER — Ambulatory Visit: Payer: Medicaid Other | Admitting: Physical Therapy

## 2018-11-01 ENCOUNTER — Encounter (INDEPENDENT_AMBULATORY_CARE_PROVIDER_SITE_OTHER): Payer: Self-pay | Admitting: Physician Assistant

## 2018-11-01 NOTE — Progress Notes (Signed)
Office Visit Note   Patient: NAZIER NEYHART           Date of Birth: Jan 16, 1971           MRN: [id number] Visit Date: 10/29/2018              Requested by: [name], [address] [place], [place] [address] PCP: [name], MD  Chief Complaint  Patient presents with  . Right Leg - Follow-up    BKA 06/19/18      HPI: The patient is a 48 year old gentleman with a history of insensate diabetic neuropathy and a previous left foot transmetatarsal amputation and a right below the knee amputation in November 2019 who is seen for follow-up of a ulcer over his left transmetatarsal amputation.  He reports that he thinks he may have gotten too close to a space heater which may have caused the blister. He has been applying Mepilex border to the ulcer and avoiding weightbearing as much as possible.  He is also wearing a silver stump shrinker stocking on the left foot to help with edema and this has helped considerably.  He had just obtained his prosthetic for his right transtibial amputation but physical therapy has been on hold due to needing to stay off of the left foot as much as possible.  He has been getting around some with the prosthesis and progressively wearing the right prosthesis at home.  Assessment & Plan: Visit Diagnoses:  1. Skin ulcer of left foot, limited to breakdown of skin ([place])   2. Acquired absence of right lower extremity below knee ([place])   3. Type 2 diabetes mellitus with diabetic polyneuropathy, with long-term current use of insulin ([place])   4. CKD (chronic kidney disease), stage III ([place])   5. S/P transmetatarsal amputation of foot, left ([facility])     Plan: Continue Mepilex border dressing to the left foot ulcer and silver stump shrinking to the left transmetatarsal amputation for edema control.  He can hopefully resume his physical therapy over the next couple of weeks at the neuro rehab center.  He will follow-up in about 1 week.  Follow-Up Instructions: Return  in about 1 week (around 11/05/2018).   Ortho Exam  Patient is alert, oriented, no adenopathy, well-dressed, normal affect, normal respiratory effort. The ulcer over the transmetatarsal amputation site does appear to be improving with decreased overall size.  He has some early epithelialization of the area.  He has a good palpable pedal pulse.  There are no signs of cellulitis or infection.  There is scant serous drainage.  Imaging: No results found.   Labs: Lab Results  Component Value Date   HGBA1C 6.7 (H) 07/29/2018   HGBA1C 15.9 (H) 05/14/2018   HGBA1C 10.7 (H) 10/04/2016   ESRSEDRATE 128 (H) 06/15/2018   ESRSEDRATE 47 (H) 05/07/2016   CRP 215.2 (H) 06/15/2018   CRP 22.6 (H) 05/07/2016   REPTSTATUS 06/22/2018 FINAL 06/17/2018   GRAMSTAIN  06/17/2018    RARE WBC PRESENT, PREDOMINANTLY PMN FEW GRAM POSITIVE COCCI    CULT  06/17/2018    MODERATE VIRIDANS STREPTOCOCCUS FEW ENTEROCOCCUS FAECALIS MODERATE PREVOTELLA SPECIES BETA LACTAMASE POSITIVE    LABORGA ENTEROCOCCUS FAECALIS 06/17/2018     Lab Results  Component Value Date   ALBUMIN 3.8 (L) 10/16/2018   ALBUMIN 3.3 (L) 08/04/2018   ALBUMIN 2.7 (L) 07/31/2018    Body mass index is 30.69 kg/m.  Orders:  No orders of the defined types were  placed in this encounter.  No orders of the defined types were placed in this encounter.    Procedures: No procedures performed  Clinical Data: No additional findings.  ROS:  All other systems negative, except as noted in the HPI. Review of Systems  Objective: Vital Signs: Ht 6\' 1"  (1.854 m)   Wt 232 lb 9.6 oz (105.5 kg)   BMI 30.69 kg/m   Specialty Comments:  No specialty comments available.  PMFS History: Patient Active Problem List   Diagnosis Date Noted  . Gait abnormality 09/11/2018  . CAD S/P percutaneous coronary angioplasty 08/06/2018  . Cardiomyopathy ([place]) 08/06/2018  . PVD (peripheral vascular disease) ([place]) 08/06/2018  . Shortness of breath    . Acute on chronic combined systolic and diastolic CHF, NYHA class 2 ([place])   . Non-ST elevation (NSTEMI) myocardial infarction ([place]) 07/28/2018  . Respiratory failure with hypoxia ([place]) 07/28/2018  . Acute heart failure ([place]) 07/28/2018  . Hypertensive emergency 07/28/2018  . S/P BKA (below knee amputation) unilateral, right ([place]) 07/15/2018  . Hypoglycemia   . Anxiety about health   . Labile blood glucose   . Essential hypertension   . History of transmetatarsal amputation of left foot ([place])   . Hypoalbuminemia due to protein-calorie malnutrition ([place])   . Stage 3 chronic kidney disease ([place])   . Poorly controlled type 2 diabetes mellitus with peripheral neuropathy ([place])   . Acute blood loss anemia   . Anemia of chronic disease   . Leukocytosis   . Tobacco abuse   . Hyperkalemia 06/20/2018  . Diabetic polyneuropathy associated with type 2 diabetes mellitus ([place])   . Severe protein-calorie malnutrition ([place])   . AKI (acute kidney injury) ([place]) 06/17/2018  . Normocytic anemia 06/17/2018  . DM2 (diabetes mellitus, type 2) ([place]) 05/14/2018  . Viral pharyngitis 01/14/2017  . Cigarette smoker 01/14/2017  . Avascular necrosis of hip, left ([place]) 10/11/2016  . Status post left hip replacement 10/11/2016  . S/P transmetatarsal amputation of foot, left ([place]) 07/25/2016  . Balance problem 07/25/2016  . Fournier's gangrene 08/16/2011  . Acute respiratory failure ([place]) 08/16/2011  . Septic shock(785.52) 08/16/2011  . Insulin dependent diabetes mellitus with complications ([place]) 41/66/0630   Past Medical History:  Diagnosis Date  . Anxiety    self reported  . Arthritis   . CHF (congestive heart failure) ([place])   . Coronary artery disease   . Depression    self reported  . Diabetes mellitus   . Hypertension     Family History  Problem Relation Age of Onset  . Diabetes Other     Past Surgical History:  Procedure Laterality Date  . AMPUTATION Left 05/10/2016   Procedure: Left  Transmetatarsal Amputation;  Surgeon: [name], MD;  Location: [facility];  Service: Orthopedics;  Laterality: Left;  . AMPUTATION Right 05/16/2018   Procedure: PARTIAL RAY AMPUTATION FIFTH TOE RIGHT FOOT;  Surgeon: [name], DPM;  Location: [facility];  Service: Podiatry;  Laterality: Right;  . AMPUTATION Right 06/17/2018   Procedure: AMPUTATION 4th RAY Right Foot;  Surgeon: Trula Slade, DPM;  Location: [facility];  Service: Podiatry;  Laterality: Right;  . AMPUTATION Right 06/19/2018   Procedure: RIGHT BELOW KNEE AMPUTATION;  Surgeon: [name], MD;  Location: [facility];  Service: Orthopedics;  Laterality: Right;  . APPLICATION OF WOUND VAC Right 06/19/2018   Procedure: APPLICATION OF WOUND VAC;  Surgeon: [name], MD;  Location: [facility];  Service: Orthopedics;  Laterality:  Right;  Marland Kitchen CARDIAC CATHETERIZATION  07/29/2018  . CIRCUMCISION  09/02/2011   Procedure: CIRCUMCISION ADULT;  Surgeon: Molli Hazard, MD;  Location: [facility];  Service: Urology;  Laterality: N/A;  . CORONARY STENT INTERVENTION N/A 07/29/2018   Procedure: CORONARY STENT INTERVENTION;  Surgeon: Leonie Man, MD;  Location: [facility];  Service: Cardiovascular;  Laterality: N/A;  . I&D EXTREMITY  09/02/2011   Procedure: IRRIGATION AND DEBRIDEMENT EXTREMITY;  Surgeon: Theodoro Kos, DO;  Location: [facility];  Service: Plastics;  Laterality: N/A;  . INCISION AND DRAINAGE OF WOUND  08/12/2011   Procedure: IRRIGATION AND DEBRIDEMENT WOUND;  Surgeon: Zenovia Jarred, MD;  Location: [facility];  Service: General;;  perineum  . IRRIGATION AND DEBRIDEMENT ABSCESS  08/12/2011   Procedure: IRRIGATION AND DEBRIDEMENT ABSCESS;  Surgeon: Molli Hazard, MD;  Location: [facility];  Service: Urology;  Laterality: N/A;  irrigation and debridment perineum    . IRRIGATION AND DEBRIDEMENT ABSCESS  08/14/2011   Procedure: MINOR INCISION AND DRAINAGE OF ABSCESS;  Surgeon: Molli Hazard,  MD;  Location: [facility];  Service: Urology;  Laterality: N/A;  Perineal Wound Debridement;Placement Bilateral Testicular Thigh Pouches  . LEFT HEART CATH AND CORONARY ANGIOGRAPHY N/A 07/29/2018   Procedure: LEFT HEART CATH AND CORONARY ANGIOGRAPHY;  Surgeon: Leonie Man, MD;  Location: [facility];  Service: Cardiovascular;  Laterality: N/A;  . TOTAL HIP ARTHROPLASTY Left 10/11/2016   Procedure: LEFT TOTAL HIP ARTHROPLASTY ANTERIOR APPROACH;  Surgeon: Mcarthur Rossetti, MD;  [facility];  Service: Orthopedics;  Laterality: Left;   Social History   Occupational History  . Occupation: Disabled  Tobacco Use  . Smoking status: Current Every Day Smoker    Types: Cigarettes  . Smokeless tobacco: Never Used  Substance and Sexual Activity  . Alcohol use: Yes    Alcohol/week: 0.0 standard drinks    Comment: has not had alcohol in a month  . Drug use: No  . Sexual activity: Not on file

## 2018-11-03 ENCOUNTER — Encounter: Payer: Medicaid Other | Admitting: *Deleted

## 2018-11-03 ENCOUNTER — Other Ambulatory Visit: Payer: Self-pay

## 2018-11-03 VITALS — Wt 227.0 lb

## 2018-11-03 DIAGNOSIS — Z006 Encounter for examination for normal comparison and control in clinical research program: Secondary | ICD-10-CM

## 2018-11-03 NOTE — Research (Signed)
[facility] visit 8 phone visit due to corono virus precautions  Pt feeling well, no complaints of cp, occasional sob nothing worse than before. Dr Stanford Breed has started him on two new meds, Losartan and Zetia. He is still trying to quit smoking.                                     "CONSENT"   YES     NO   Continuing further Investigational Product and study visits for follow-up? [x]  []   Continuing consent from future biomedical research [x]  []                                    "EVENTS"    YES     NO  AE   (IF YES SEE SOURCE) []  [x]   SAE  (IF YES SEE SOURCE) []  [x]   ENDPOINT   (IF YES SEE SOURCE) []  [x]   REVASCULARIZATION  (IF YES SEE SOURCE) []  [x]   AMPUTATION   (IF YES SEE SOURCE) []  [x]   TROPONIN'S  (IF YES SEE SOURCE) []  [x]    Lifestyle Adherence Assessment:    YES NO  Abstinence from smoking/remaining tobacco free  X  Cardiac Diet X   Routine physical activity and/or cardiac rehabilitation X    [id number]  MOBILITY:    I HAVE NO PROBLEMS WALKING []   I HAVE SLIGHT PROBLEMS WALKING [x]   I HAVE MODERATE PROBLEMS WALKING []   I HAVE SEVERE PROBLEMS WALKING []   I AM UNABLE TO WALK  []     SELF-CARE:   I HAVE NO PROBLEMS WASING OR DRESSING MYSELF  []   I HAVE SLIGHT PROBLEMS WASHING OR DRESSING MYSELF  [x]   I HAVE MODERATE PROBLEMS WASHING OR DRESSING MYSELF []   I HAVE SEVERE PROBLEMS WASHING OR DRESSING MYSELF  []   I HAVE SEVERE PROBLEMS WASHING OR DRESSING MYSELF  []   I AM UNABLE TO WASH OR DRESS MYSELF []     USUAL ACTIVITIES: (E.G. WORK/STUDY/HOUSEWORK/FAMILY OR LEISURE ACTIVITIES.    I HAVE NO PROBLEMS DOING MY USUAL ACTIVITIES []   I HAVE SLIGHT PROBLEMS DOING MY USUAL ACTIVITIES [x]   I HAVE MODERATE PROBLEMS DOING MY USUAL ACTIVIITIES []   I HAVE SEVERE PROBLEMS DOING MY USUAL ACTIVITIES []   I AM UNABLE TO DO MY USUAL ACTIVITIES []     PAIN /DISCOMFORT   I HAVE NO PAIN OR DISCOMFORT []   I HAVE SLIGHT PAIN OR DISCOMFORT []   I HAVE MODERATE PAIN OR DISCOMFORT [x]   I HAVE  SEVERE PAIN OR DISCOMFORT []   I HAVE EXTREME PAIN OR DISCOMFORT []     ANXIETY/DEPRESSION   I AM NOT ANXIOUS OR DEPRESSED [x]   I AM SLIGHTLY ANXIOUS OR DEPRESSED []   I AM MODERATELY ANXIOUS OR DREPRESSED []   I AM SEVERELY ANXIOUS OR DEPRESSED []   I AM EXTREMELY ANXIOUS OR DEPRESSED []     SCALE OF 0-100 HOW WOULD YOU RATE TODAY?  0 IS THE WORSE AND 100 IS THE BEST HEALTH YOU CAN IMAGINE: 85    Current Outpatient Medications:  .  acetaminophen (TYLENOL) 325 MG tablet, Take 1-2 tablets (325-650 mg total) by mouth every 6 (six) hours as needed for mild pain (pain score 1-3 or temp > 100.5)., Disp: , Rfl:  .  ALPRAZolam (XANAX) 0.25 MG tablet, Take 1 tablet (0.25 mg total) by mouth 3 (three) times daily as needed for anxiety.,  Disp: 30 tablet, Rfl: 0 .  aspirin EC 81 MG EC tablet, Take 1 tablet (81 mg total) by mouth daily., Disp: 90 tablet, Rfl: 3 .  atorvastatin (LIPITOR) 80 MG tablet, Take 1 tablet (80 mg total) by mouth daily at 6 PM., Disp: 90 tablet, Rfl: 3 .  blood glucose meter kit and supplies, Dispense based on patient and insurance preference. Use up to four times daily as directed. (FOR ICD-9 250.00, 250.01)., Disp: 1 each, Rfl: 0 .  docusate sodium (COLACE) 100 MG capsule, Take 100 mg by mouth as needed for mild constipation., Disp: , Rfl:  .  doxycycline (VIBRAMYCIN) 100 MG capsule, Take 1 capsule (100 mg total) by mouth 2 (two) times daily., Disp: 28 capsule, Rfl: 1 .  DULoxetine (CYMBALTA) 30 MG capsule, Take 1 capsule (30 mg total) by mouth daily., Disp: 30 capsule, Rfl: 1 .  ezetimibe (ZETIA) 10 MG tablet, Take 1 tablet (10 mg total) by mouth daily., Disp: 90 tablet, Rfl: 3 .  furosemide (LASIX) 40 MG tablet, Take 1 tablet (40 mg total) by mouth daily., Disp: 90 tablet, Rfl: 3 .  hydrALAZINE (APRESOLINE) 50 MG tablet, Take 1 tablet (50 mg total) by mouth every 8 (eight) hours., Disp: 270 tablet, Rfl: 3 .  insulin aspart protamine - aspart (NOVOLOG MIX 70/30 FLEXPEN) (70-30) 100  UNIT/ML FlexPen, Inject 0.15 mLs (15 Units total) into the skin 2 (two) times daily with a meal., Disp: 15 mL, Rfl: 11 .  Insulin Pen Needle 31G X 5 MM MISC, Use daily to inject insulin as instructed, Disp: 100 each, Rfl: 2 .  isosorbide mononitrate (IMDUR) 30 MG 24 hr tablet, Take 1 tablet (30 mg total) by mouth daily., Disp: 90 tablet, Rfl: 3 .  losartan (COZAAR) 25 MG tablet, Take 1 tablet (25 mg total) by mouth daily., Disp: 90 tablet, Rfl: 3 .  methocarbamol (ROBAXIN) 500 MG tablet, Take 1 tablet (500 mg total) by mouth every 6 (six) hours as needed for muscle spasms., Disp: 60 tablet, Rfl: 0 .  metoprolol succinate (TOPROL-XL) 50 MG 24 hr tablet, Take 1 tablet (50 mg total) by mouth daily. Take with or immediately following a meal., Disp: 90 tablet, Rfl: 3 .  Multiple Vitamin (MULTIVITAMIN WITH MINERALS) TABS tablet, Take 1 tablet by mouth daily., Disp: 90 tablet, Rfl: 3 .  sitaGLIPtin-metformin (JANUMET) 50-1000 MG tablet, Take 1 tablet by mouth 2 (two) times daily with a meal. (Patient taking differently: Take 1 tablet by mouth daily. ), Disp: 60 tablet, Rfl: 3 .  ticagrelor (BRILINTA) 90 MG TABS tablet, Take 1 tablet (90 mg total) by mouth 2 (two) times daily., Disp: 60 tablet, Rfl: 0 .  traMADol (ULTRAM) 50 MG tablet, Take 50 mg by mouth every 6 (six) hours as needed for moderate pain., Disp: , Rfl:

## 2018-11-05 ENCOUNTER — Ambulatory Visit (INDEPENDENT_AMBULATORY_CARE_PROVIDER_SITE_OTHER): Payer: Medicaid Other | Admitting: Orthopedic Surgery

## 2018-11-05 ENCOUNTER — Other Ambulatory Visit: Payer: Self-pay

## 2018-11-12 ENCOUNTER — Ambulatory Visit: Payer: Medicaid Other | Admitting: Physical Therapy

## 2018-11-17 ENCOUNTER — Encounter: Payer: Medicaid Other | Admitting: Physical Therapy

## 2018-11-19 ENCOUNTER — Encounter: Payer: Medicaid Other | Admitting: Physical Therapy

## 2018-11-19 MED FILL — METOPROLOL SUCCINATE ER 50: 50 | 30 days supply | Qty: 30 | Fill #0

## 2018-11-19 MED FILL — FUROSEMIDE 40 MG TAB: 40 | 30 days supply | Qty: 30 | Fill #0

## 2018-11-19 MED FILL — ISOSORBIDE MN ER 30 MG TAB: 30 | 30 days supply | Qty: 30 | Fill #0

## 2018-11-20 ENCOUNTER — Telehealth: Payer: Self-pay | Admitting: Physical Therapy

## 2018-11-20 NOTE — Telephone Encounter (Signed)
Edward [name] was contacted today regarding the temporary closing of OP Rehab Services due to Covid-19.  Therapist discussed: questions or concerns patient had during this time and status of wound on foot.  Pt reports that the wound is healing well.    Patient is not appropriate at this time for an e-visit, virtual check in, or telehealth visit, if those services become available.    [facility] Rehabilitation Services will follow up with patients when we are able to resume care.  If OP Neuro remains closed, will discuss with primary PT if pt would be appropriate to bring into the clinic to re-assess.  Rico Junker, PT, DPT 11/20/18    3:48 PM  [facility] [address] [place], [place]  [address] Phone:  [phone] Fax:  [phone]

## 2018-11-24 ENCOUNTER — Ambulatory Visit: Payer: Medicaid Other | Admitting: Physical Therapy

## 2018-11-26 ENCOUNTER — Encounter: Payer: Medicaid Other | Admitting: Physical Therapy

## 2018-12-01 ENCOUNTER — Encounter: Payer: Medicaid Other | Admitting: Physical Therapy

## 2018-12-03 ENCOUNTER — Encounter: Payer: Medicaid Other | Admitting: Physical Therapy

## 2018-12-08 ENCOUNTER — Encounter: Payer: Medicaid Other | Admitting: Physical Therapy

## 2018-12-10 ENCOUNTER — Encounter: Payer: Medicaid Other | Admitting: Physical Therapy

## 2018-12-15 ENCOUNTER — Encounter: Payer: Medicaid Other | Admitting: Physical Therapy

## 2018-12-17 ENCOUNTER — Encounter: Payer: Medicaid Other | Admitting: Physical Therapy

## 2018-12-21 ENCOUNTER — Encounter (HOSPITAL_COMMUNITY): Payer: Self-pay | Admitting: Emergency Medicine

## 2018-12-21 ENCOUNTER — Other Ambulatory Visit: Payer: Self-pay

## 2018-12-21 ENCOUNTER — Emergency Department (HOSPITAL_COMMUNITY): Payer: Medicaid Other

## 2018-12-21 ENCOUNTER — Inpatient Hospital Stay (HOSPITAL_COMMUNITY)
Admission: EM | Admit: 2018-12-21 | Discharge: 2018-12-24 | DRG: 291 | Disposition: A | Discharge status: Home / Self Care | Payer: Medicaid Other | Attending: Internal Medicine | Admitting: Internal Medicine

## 2018-12-21 DIAGNOSIS — F419 Anxiety disorder, unspecified: Secondary | ICD-10-CM | POA: Diagnosis present

## 2018-12-21 DIAGNOSIS — Z833 Family history of diabetes mellitus: Secondary | ICD-10-CM

## 2018-12-21 DIAGNOSIS — Z96642 Presence of left artificial hip joint: Secondary | ICD-10-CM | POA: Diagnosis present

## 2018-12-21 DIAGNOSIS — E119 Type 2 diabetes mellitus without complications: Secondary | ICD-10-CM

## 2018-12-21 DIAGNOSIS — R0602 Shortness of breath: Secondary | ICD-10-CM

## 2018-12-21 DIAGNOSIS — N179 Acute kidney failure, unspecified: Secondary | ICD-10-CM | POA: Diagnosis present

## 2018-12-21 DIAGNOSIS — I10 Essential (primary) hypertension: Secondary | ICD-10-CM | POA: Diagnosis present

## 2018-12-21 DIAGNOSIS — E1165 Type 2 diabetes mellitus with hyperglycemia: Secondary | ICD-10-CM | POA: Diagnosis present

## 2018-12-21 DIAGNOSIS — Z794 Long term (current) use of insulin: Secondary | ICD-10-CM

## 2018-12-21 DIAGNOSIS — R7401 Elevation of levels of liver transaminase levels: Secondary | ICD-10-CM

## 2018-12-21 DIAGNOSIS — Z7982 Long term (current) use of aspirin: Secondary | ICD-10-CM

## 2018-12-21 DIAGNOSIS — E1151 Type 2 diabetes mellitus with diabetic peripheral angiopathy without gangrene: Secondary | ICD-10-CM | POA: Diagnosis present

## 2018-12-21 DIAGNOSIS — I2510 Atherosclerotic heart disease of native coronary artery without angina pectoris: Secondary | ICD-10-CM | POA: Diagnosis present

## 2018-12-21 DIAGNOSIS — R778 Other specified abnormalities of plasma proteins: Secondary | ICD-10-CM

## 2018-12-21 DIAGNOSIS — Z9114 Patient's other noncompliance with medication regimen: Secondary | ICD-10-CM

## 2018-12-21 DIAGNOSIS — I429 Cardiomyopathy, unspecified: Secondary | ICD-10-CM | POA: Diagnosis present

## 2018-12-21 DIAGNOSIS — Z79899 Other long term (current) drug therapy: Secondary | ICD-10-CM

## 2018-12-21 DIAGNOSIS — I252 Old myocardial infarction: Secondary | ICD-10-CM

## 2018-12-21 DIAGNOSIS — Z89432 Acquired absence of left foot: Secondary | ICD-10-CM

## 2018-12-21 DIAGNOSIS — N183 Chronic kidney disease, stage 3 (moderate): Secondary | ICD-10-CM | POA: Diagnosis present

## 2018-12-21 DIAGNOSIS — I248 Other forms of acute ischemic heart disease: Secondary | ICD-10-CM | POA: Diagnosis present

## 2018-12-21 DIAGNOSIS — R609 Edema, unspecified: Secondary | ICD-10-CM

## 2018-12-21 DIAGNOSIS — Z20828 Contact with and (suspected) exposure to other viral communicable diseases: Secondary | ICD-10-CM | POA: Diagnosis present

## 2018-12-21 DIAGNOSIS — I130 Hypertensive heart and chronic kidney disease with heart failure and stage 1 through stage 4 chronic kidney disease, or unspecified chronic kidney disease: Principal | ICD-10-CM | POA: Diagnosis present

## 2018-12-21 DIAGNOSIS — F17210 Nicotine dependence, cigarettes, uncomplicated: Secondary | ICD-10-CM | POA: Diagnosis present

## 2018-12-21 DIAGNOSIS — F329 Major depressive disorder, single episode, unspecified: Secondary | ICD-10-CM | POA: Diagnosis present

## 2018-12-21 DIAGNOSIS — I509 Heart failure, unspecified: Secondary | ICD-10-CM

## 2018-12-21 DIAGNOSIS — E785 Hyperlipidemia, unspecified: Secondary | ICD-10-CM | POA: Diagnosis present

## 2018-12-21 DIAGNOSIS — I5043 Acute on chronic combined systolic (congestive) and diastolic (congestive) heart failure: Secondary | ICD-10-CM | POA: Diagnosis not present

## 2018-12-21 DIAGNOSIS — Z955 Presence of coronary angioplasty implant and graft: Secondary | ICD-10-CM

## 2018-12-21 DIAGNOSIS — Z9111 Patient's noncompliance with dietary regimen: Secondary | ICD-10-CM

## 2018-12-21 DIAGNOSIS — R7989 Other specified abnormal findings of blood chemistry: Secondary | ICD-10-CM

## 2018-12-21 DIAGNOSIS — Z89511 Acquired absence of right leg below knee: Secondary | ICD-10-CM

## 2018-12-21 DIAGNOSIS — E1122 Type 2 diabetes mellitus with diabetic chronic kidney disease: Secondary | ICD-10-CM | POA: Diagnosis present

## 2018-12-21 LAB — CBC
HCT: 38.1 % — ABNORMAL LOW (ref 39.0–52.0)
Hemoglobin: 12 g/dL — ABNORMAL LOW (ref 13.0–17.0)
MCH: 27.4 pg (ref 26.0–34.0)
MCHC: 31.5 g/dL (ref 30.0–36.0)
MCV: 87 fL (ref 80.0–100.0)
Platelets: 230 10*3/uL (ref 150–400)
RBC: 4.38 MIL/uL (ref 4.22–5.81)
RDW: 13.8 % (ref 11.5–15.5)
WBC: 5.8 10*3/uL (ref 4.0–10.5)
nRBC: 0 % (ref 0.0–0.2)

## 2018-12-21 LAB — BASIC METABOLIC PANEL
Anion gap: 10 (ref 5–15)
BUN: 36 mg/dL — ABNORMAL HIGH (ref 6–20)
CO2: 18 mmol/L — ABNORMAL LOW (ref 22–32)
Calcium: 8.6 mg/dL — ABNORMAL LOW (ref 8.9–10.3)
Chloride: 110 mmol/L (ref 98–111)
Creatinine, Ser: 2.06 mg/dL — ABNORMAL HIGH (ref 0.61–1.24)
GFR calc Af Amer: 43 mL/min — ABNORMAL LOW (ref >60)
GFR calc non Af Amer: 37 mL/min — ABNORMAL LOW (ref >60)
Glucose, Bld: 132 mg/dL — ABNORMAL HIGH (ref 70–99)
Potassium: 3.9 mmol/L (ref 3.5–5.1)
Sodium: 138 mmol/L (ref 135–145)

## 2018-12-21 LAB — HEPATIC FUNCTION PANEL
ALT: 16 U/L (ref 0–44)
AST: 17 U/L (ref 15–41)
Albumin: 3.4 g/dL — ABNORMAL LOW (ref 3.5–5.0)
Alkaline Phosphatase: 47 U/L (ref 38–126)
Bilirubin, Direct: <0.1 mg/dL (ref 0.0–0.2)
Total Bilirubin: 0.9 mg/dL (ref 0.3–1.2)
Total Protein: 6.5 g/dL (ref 6.5–8.1)

## 2018-12-21 LAB — LIPASE, BLOOD: Lipase: 27 U/L (ref 11–51)

## 2018-12-21 LAB — BRAIN NATRIURETIC PEPTIDE: B Natriuretic Peptide: 631.7 pg/mL — ABNORMAL HIGH (ref 0.0–100.0)

## 2018-12-21 LAB — SARS CORONAVIRUS 2 BY RT PCR (HOSPITAL ORDER, PERFORMED IN ~~LOC~~ HOSPITAL LAB): SARS Coronavirus 2: NEGATIVE

## 2018-12-21 LAB — TROPONIN I: Troponin I: 0.05 ng/mL (ref <0.03)

## 2018-12-21 MED ORDER — SODIUM CHLORIDE 0.9% FLUSH
3.0000 mL | Freq: Once | INTRAVENOUS | Status: AC
  Administered 2018-12-21: 18:00:00 3 mL via INTRAVENOUS

## 2018-12-21 MED ORDER — HYDRALAZINE HCL 20 MG/ML IJ SOLN
5.0000 mg | INTRAMUSCULAR | Status: DC | PRN
  Administered 2018-12-22 (×2): 5 mg via INTRAVENOUS
  Filled 2018-12-21 (×2): qty 1

## 2018-12-21 MED ORDER — INSULIN ASPART 100 UNIT/ML ~~LOC~~ SOLN
0.0000 [IU] | Freq: Three times a day (TID) | SUBCUTANEOUS | Status: DC
  Administered 2018-12-22: 1 [IU] via SUBCUTANEOUS
  Administered 2018-12-22: 12:00:00 3 [IU] via SUBCUTANEOUS
  Administered 2018-12-23: 18:00:00 2 [IU] via SUBCUTANEOUS

## 2018-12-21 MED ORDER — HEPARIN SODIUM (PORCINE) 5000 UNIT/ML IJ SOLN
5000.0000 [IU] | Freq: Three times a day (TID) | INTRAMUSCULAR | Status: DC
  Administered 2018-12-22 – 2018-12-23 (×5): 5000 [IU] via SUBCUTANEOUS
  Filled 2018-12-21 (×6): qty 1

## 2018-12-21 MED ORDER — FUROSEMIDE 10 MG/ML IJ SOLN
60.0000 mg | Freq: Once | INTRAMUSCULAR | Status: AC
  Administered 2018-12-21: 19:00:00 60 mg via INTRAVENOUS
  Filled 2018-12-21: qty 6

## 2018-12-21 MED ORDER — FUROSEMIDE 10 MG/ML IJ SOLN
40.0000 mg | Freq: Two times a day (BID) | INTRAMUSCULAR | Status: DC
  Administered 2018-12-22 – 2018-12-23 (×3): 40 mg via INTRAVENOUS
  Filled 2018-12-21 (×3): qty 4

## 2018-12-21 MED FILL — BRILINTA 90 MG TABLET: 90 | 30 days supply | Qty: 60 | Fill #0

## 2018-12-21 MED FILL — FUROSEMIDE 40 MG TAB: 40 | 30 days supply | Qty: 30 | Fill #1

## 2018-12-21 NOTE — ED Notes (Signed)
Assisted pt to ambulate in hallway. SBA needed (unsteady with prosthesis since he has not had any rehab sessions yet d't covid closures). Pulse ox started at 97%, dropped to 93% during ambulation, 95% after rest. Pt reports worse SOB during ambulation, denies dizziness.

## 2018-12-21 NOTE — H&P (Signed)
History and Physical    DSHAUN REPPUCCI HRC:[id number] DOB: 11-Dec-1970 DOA: 12/21/2018  PCP: [name], MD Patient coming from: Home  Chief Complaint: Shortness of breath  HPI: Edward [name] is a 48 y.o. male with medical history significant of chronic combined systolic and diastolic congestive heart failure, CAD status post PCI, type 2 diabetes, hypertension, and conditions listed below presenting to the hospital for evaluation of shortness of breath. Patient reports 2-day history of shortness of breath, orthopnea, and lower extremity edema.  Does endorse increased dietary fluid and salt intake.  States he took 3 tablets of Lasix 40 mg yesterday and 2 tablets today.  Reports noncompliance with home blood pressure medications.  Denies any fevers, chills, chest pain, sick contacts, recent travel, or exposure to any individual with confirmed or suspected COVID-19.  Review of Systems: As per HPI otherwise 10 point review of systems negative.  Past Medical History:  Diagnosis Date  . Anxiety    self reported  . Arthritis   . CHF (congestive heart failure) ([place])   . Coronary artery disease   . Depression    self reported  . Diabetes mellitus   . Hypertension     Past Surgical History:  Procedure Laterality Date  . AMPUTATION Left 05/10/2016   Procedure: Left Transmetatarsal Amputation;  Surgeon: [name], MD;  Location: [place];  Service: Orthopedics;  Laterality: Left;  . AMPUTATION Right 05/16/2018   Procedure: PARTIAL RAY AMPUTATION FIFTH TOE RIGHT FOOT;  Surgeon: [name], DPM;  Location: [place];  Service: Podiatry;  Laterality: Right;  . AMPUTATION Right 06/17/2018   Procedure: AMPUTATION 4th RAY Right Foot;  Surgeon: Trula Slade, DPM;  Location: [place];  Service: Podiatry;  Laterality: Right;  . AMPUTATION Right 06/19/2018   Procedure: RIGHT BELOW KNEE AMPUTATION;  Surgeon: [name], MD;  Location: [place];  Service: Orthopedics;  Laterality: Right;  .  APPLICATION OF WOUND VAC Right 06/19/2018   Procedure: APPLICATION OF WOUND VAC;  Surgeon: [name], MD;  Location: [place];  Service: Orthopedics;  Laterality: Right;  . CARDIAC CATHETERIZATION  07/29/2018  . CIRCUMCISION  09/02/2011   Procedure: CIRCUMCISION ADULT;  Surgeon: Molli Hazard, MD;  Location: [place];  Service: Urology;  Laterality: N/A;  . CORONARY STENT INTERVENTION N/A 07/29/2018   Procedure: CORONARY STENT INTERVENTION;  Surgeon: Leonie Man, MD;  Location: [facility];  Service: Cardiovascular;  Laterality: N/A;  . I&D EXTREMITY  09/02/2011   Procedure: IRRIGATION AND DEBRIDEMENT EXTREMITY;  Surgeon: Theodoro Kos, DO;  Location: [place];  Service: Plastics;  Laterality: N/A;  . INCISION AND DRAINAGE OF WOUND  08/12/2011   Procedure: IRRIGATION AND DEBRIDEMENT WOUND;  Surgeon: Zenovia Jarred, MD;  Location: [place];  Service: General;;  perineum  . IRRIGATION AND DEBRIDEMENT ABSCESS  08/12/2011   Procedure: IRRIGATION AND DEBRIDEMENT ABSCESS;  Surgeon: Molli Hazard, MD;  Location: [place];  Service: Urology;  Laterality: N/A;  irrigation and debridment perineum    . IRRIGATION AND DEBRIDEMENT ABSCESS  08/14/2011   Procedure: MINOR INCISION AND DRAINAGE OF ABSCESS;  Surgeon: Molli Hazard, MD;  Location: [place];  Service: Urology;  Laterality: N/A;  Perineal Wound Debridement;Placement Bilateral Testicular Thigh Pouches  . LEFT HEART CATH AND CORONARY ANGIOGRAPHY N/A 07/29/2018   Procedure: LEFT HEART CATH AND CORONARY ANGIOGRAPHY;  Surgeon: Leonie Man, MD;  Location: [facility];  Service: Cardiovascular;  Laterality: N/A;  . TOTAL HIP ARTHROPLASTY Left 10/11/2016   Procedure: LEFT TOTAL HIP ARTHROPLASTY ANTERIOR APPROACH;  Surgeon: Mcarthur Rossetti, MD;  Location: [facility];  Service: Orthopedics;  Laterality: Left;     reports that he has been smoking cigarettes. He has never used smokeless  tobacco. He reports current alcohol use. He reports that he does not use drugs.  No Known Allergies  Family History  Problem Relation Age of Onset  . Diabetes Other     Prior to Admission medications   Medication Sig Start Date End Date Taking? Authorizing Provider  acetaminophen (TYLENOL) 325 MG tablet Take 1-2 tablets (325-650 mg total) by mouth every 6 (six) hours as needed for mild pain (pain score 1-3 or temp > 100.5). 07/01/18   Angiulli, Lavon Paganini, PA-C  ALPRAZolam Duanne Moron) 0.25 MG tablet Take 1 tablet (0.25 mg total) by mouth 3 (three) times daily as needed for anxiety. 08/04/18   Valarie Merino, MD  aspirin EC 81 MG EC tablet Take 1 tablet (81 mg total) by mouth daily. 08/01/18   Daune Perch, NP  atorvastatin (LIPITOR) 80 MG tablet Take 1 tablet (80 mg total) by mouth daily at 6 PM. 07/31/18   Daune Perch, NP  blood glucose meter kit and supplies Dispense based on patient and insurance preference. Use up to four times daily as directed. (FOR ICD-9 250.00, 250.01). 07/01/18   Angiulli, Lavon Paganini, PA-C  docusate sodium (COLACE) 100 MG capsule Take 100 mg by mouth as needed for mild constipation.    [provider]  doxycycline (VIBRAMYCIN) 100 MG capsule Take 1 capsule (100 mg total) by mouth 2 (two) times daily. 10/15/18   Rayburn, Neta Mends, PA-C  DULoxetine (CYMBALTA) 30 MG capsule Take 1 capsule (30 mg total) by mouth daily. 09/11/18   [name], MD  ezetimibe (ZETIA) 10 MG tablet Take 1 tablet (10 mg total) by mouth daily. 10/21/18 01/19/19  Lelon Perla, MD  furosemide (LASIX) 40 MG tablet Take 1 tablet (40 mg total) by mouth daily. 08/01/18   Daune Perch, NP  hydrALAZINE (APRESOLINE) 50 MG tablet Take 1 tablet (50 mg total) by mouth every 8 (eight) hours. 07/31/18   Daune Perch, NP  insulin aspart protamine - aspart (NOVOLOG MIX 70/30 FLEXPEN) (70-30) 100 UNIT/ML FlexPen Inject 0.15 mLs (15 Units total) into the skin 2 (two) times daily with a  meal. 07/01/18   Angiulli, Lavon Paganini, PA-C  Insulin Pen Needle 31G X 5 MM MISC Use daily to inject insulin as instructed 07/01/18   Angiulli, Lavon Paganini, PA-C  isosorbide mononitrate (IMDUR) 30 MG 24 hr tablet Take 1 tablet (30 mg total) by mouth daily. 08/01/18   Daune Perch, NP  losartan (COZAAR) 25 MG tablet Take 1 tablet (25 mg total) by mouth daily. 10/12/18 01/10/19  Lelon Perla, MD  methocarbamol (ROBAXIN) 500 MG tablet Take 1 tablet (500 mg total) by mouth every 6 (six) hours as needed for muscle spasms. 07/01/18   Angiulli, Lavon Paganini, PA-C  metoprolol succinate (TOPROL-XL) 50 MG 24 hr tablet Take 1 tablet (50 mg total) by mouth daily. Take with or immediately following a meal. 08/01/18   Daune Perch, NP  Multiple Vitamin (MULTIVITAMIN WITH MINERALS) TABS tablet Take 1 tablet by mouth daily. 08/01/18   Daune Perch, NP  sitaGLIPtin-metformin (JANUMET) 50-1000 MG tablet Take 1 tablet by mouth 2 (two) times daily with a meal. Patient taking differently: Take 1 tablet by mouth daily.  07/01/18  Angiulli, Lavon Paganini, PA-C  ticagrelor (BRILINTA) 90 MG TABS tablet Take 1 tablet (90 mg total) by mouth 2 (two) times daily. 07/31/18 01/27/19  Daune Perch, NP  traMADol (ULTRAM) 50 MG tablet Take 50 mg by mouth every 6 (six) hours as needed for moderate pain.    [provider]    Physical Exam: Vitals:   12/21/18 2333 12/22/18 0100 12/22/18 0206 12/22/18 0435  BP: (!) 186/104 (!) 163/95  (!) 157/81  Pulse: 83   95  Resp: 20   20  Temp: 98.5 F (36.9 C)   98.4 F (36.9 C)  TempSrc: Oral   Oral  SpO2: 97%   92%  Weight:   104.5 kg   Height:   _0  (1.854 m)     Physical Exam  Constitutional: He is oriented to person, place, and time. He appears well-developed and well-nourished.  HENT:  Head: Normocephalic.  Mouth/Throat: Oropharynx is clear and moist.  Eyes: Right eye exhibits no discharge. Left eye exhibits no discharge.  Neck: Neck supple. JVD present.   Cardiovascular: Normal rate, regular rhythm and intact distal pulses.  Pulmonary/Chest: He has no wheezes. He has rales.  Slightly increased work of breathing On 2.5 L supplemental oxygen via nasal cannula Bibasilar rales  Abdominal: Soft. Bowel sounds are normal. He exhibits no distension. There is no abdominal tenderness. There is no guarding.  Musculoskeletal:     Comments: Right lower extremity BKA +2 pitting edema of the left lower extremity  Neurological: He is alert and oriented to person, place, and time.  Skin: Skin is warm and dry.     Labs on Admission: I have personally reviewed following labs and imaging studies  CBC: Recent Labs  Lab 12/21/18 1424  WBC 5.8  HGB 12.0*  HCT 38.1*  MCV 87.0  PLT 161   Basic Metabolic Panel: Recent Labs  Lab 12/21/18 1424 12/22/18 0423  NA 138 140  K 3.9 3.8  CL 110 110  CO2 18* 17*  GLUCOSE 132* 143*  BUN 36* 35*  CREATININE 2.06* 1.99*  CALCIUM 8.6* 8.7*   GFR: Estimated Creatinine Clearance: 57 mL/min (A) (by C-G formula based on SCr of 1.99 mg/dL (H)). Liver Function Tests: Recent Labs  Lab 12/21/18 1450  AST 17  ALT 16  ALKPHOS 47  BILITOT 0.9  PROT 6.5  ALBUMIN 3.4*   Recent Labs  Lab 12/21/18 1450  LIPASE 27   No results for input(s): AMMONIA in the last 168 hours. Coagulation Profile: No results for input(s): INR, PROTIME in the last 168 hours. Cardiac Enzymes: Recent Labs  Lab 12/21/18 1450 12/22/18 0024 12/22/18 0423  TROPONINI 0.05* 0.06* 0.05*   BNP (last 3 results) No results for input(s): PROBNP in the last 8760 hours. HbA1C: Recent Labs    12/22/18 0024  HGBA1C 7.5*   CBG: Recent Labs  Lab 12/22/18 0026  GLUCAP 143*   Lipid Profile: No results for input(s): CHOL, HDL, LDLCALC, TRIG, CHOLHDL, LDLDIRECT in the last 72 hours. Thyroid Function Tests: No results for input(s): TSH, T4TOTAL, FREET4, T3FREE, THYROIDAB in the last 72 hours. Anemia Panel: No results for input(s):  VITAMINB12, FOLATE, FERRITIN, TIBC, IRON, RETICCTPCT in the last 72 hours. Urine analysis:    Component Value Date/Time   COLORURINE AMBER (A) 06/16/2018 1820   APPEARANCEUR HAZY (A) 06/16/2018 1820   LABSPEC 1.022 06/16/2018 1820   PHURINE 5.0 06/16/2018 1820   GLUCOSEU NEGATIVE 06/16/2018 1820   HGBUR SMALL (A) 06/16/2018 1820  BILIRUBINUR NEGATIVE 06/16/2018 [place] 06/16/2018 1820   PROTEINUR >=300 (A) 06/16/2018 1820   UROBILINOGEN 0.2 05/04/2014 1002   NITRITE NEGATIVE 06/16/2018 1820   LEUKOCYTESUR NEGATIVE 06/16/2018 1820    Radiological Exams on Admission: Dg Chest 2 View  Result Date: 12/21/2018 CLINICAL DATA:  Shortness of breath. EXAM: CHEST - 2 VIEW COMPARISON:  Radiograph of September 24, 2018. FINDINGS: Stable cardiomediastinal silhouette. Mild central pulmonary vascular congestion is noted. Increased bibasilar opacities are noted concerning for pulmonary edema with associated pleural effusions. No pneumothorax is noted. Bony thorax is unremarkable. IMPRESSION: Stable cardiomegaly with central pulmonary vascular congestion. Increased bibasilar edema and pleural effusions is noted. Electronically Signed   By: Marijo Conception M.D.   On: 12/21/2018 14:41    EKG: Independently reviewed.  Sinus rhythm, first-degree AV block.  No acute ischemic changes.  Assessment/Plan Principal Problem:   Acute exacerbation of CHF (congestive heart failure) ([facility]) Active Problems:   DM2 (diabetes mellitus, type 2) ([facility])   AKI (acute kidney injury) ([place])   Essential hypertension   Elevated troponin   Acute exacerbation of chronic combined systolic and diastolic congestive heart failure Patient is presenting with a 2-day history of dyspnea, orthopnea, and bilateral lower extremity edema.  Does endorse increased dietary sodium and fluid intake.  Has JVD and bibasilar rales on exam.  SPO2 93% with ambulation.  BNP 631.  SARS-CoV-2 rapid test negative.  Chest x-ray showing  stable cardiomegaly with central pulmonary vascular congestion, increase bibasilar edema, and pleural effusions.  Echo done in December 2019 showing EF 40 to 45% and grade 1 diastolic dysfunction. -Cardiac monitoring -Received Lasix 60 mg in the ED.  Continue Lasix 40 mg every 12 hours. -Monitor intake and output, daily weights, low-sodium diet with fluid restriction -Repeat echocardiogram -Supplemental oxygen as needed  AKI on CKD 3 Creatinine 2.0, baseline 1.6.  Possibly cardiorenal in the setting of acutely decompensated CHF. -IV diuresis as above -Continue to monitor renal function  Mild troponin elevation Likely related to acutely decompensated CHF.  Troponin 0.05 > 0.06.  EKG not suggestive of ACS.  Patient is not complaining of chest pain. -Cardiac monitoring -Continue to trend troponin  Type 2 diabetes A1c 7.5. -Sliding scale insulin and CBG checks.  Hypertension Blood pressure elevated. -IV diuresis as above -Hydralazine PRN  Unable to safely order home medications at this time as pharmacy medication reconciliation is pending.  DVT prophylaxis: Subcutaneous heparin Code Status: Full code Family Communication: No family available. Disposition Plan: Anticipate discharge after clinical improvement. Consults called: None Admission status: Observation, telemetry  This chart was dictated using voice recognition software.  Despite best efforts to proofread, errors can occur which can change the documentation meaning.  Shela Leff MD [facility] Pager [phone]  If 7PM-7AM, please contact night-coverage [email or web address] Password [email or web address]  12/22/2018, 6:07 AM

## 2018-12-21 NOTE — ED Provider Notes (Addendum)
[facility] EMERGENCY DEPARTMENT Provider Note   CSN: [id number] Arrival date & time: 12/21/18  1343    History   Chief Complaint Chief Complaint  Patient presents with  . Congestive Heart Failure  . Shortness of Breath    HPI Edward [name] is a 48 y.o. male.     The history is provided by the patient and medical records. No language interpreter was used.  Shortness of Breath  Severity:  Severe Onset quality:  Gradual Duration:  2 days Timing:  Constant Progression:  Worsening Chronicity:  Recurrent Context: not URI   Relieved by:  Nothing Worsened by:  Exertion Ineffective treatments:  None tried Associated symptoms: no abdominal pain, no chest pain, no cough, no diaphoresis, no fever, no headaches, no neck pain, no rash, no sputum production, no vomiting and no wheezing   Risk factors: obesity     Past Medical History:  Diagnosis Date  . Anxiety    self reported  . Arthritis   . CHF (congestive heart failure) ([place])   . Coronary artery disease   . Depression    self reported  . Diabetes mellitus   . Hypertension     Patient Active Problem List   Diagnosis Date Noted  . Gait abnormality 09/11/2018  . CAD S/P percutaneous coronary angioplasty 08/06/2018  . Cardiomyopathy ([place]) 08/06/2018  . PVD (peripheral vascular disease) ([place]) 08/06/2018  . Shortness of breath   . Acute on chronic combined systolic and diastolic CHF, NYHA class 2 ([place])   . Non-ST elevation (NSTEMI) myocardial infarction ([place]) 07/28/2018  . Respiratory failure with hypoxia ([place]) 07/28/2018  . Acute heart failure ([place]) 07/28/2018  . Hypertensive emergency 07/28/2018  . S/P BKA (below knee amputation) unilateral, right ([place]) 07/15/2018  . Hypoglycemia   . Anxiety about health   . Labile blood glucose   . Essential hypertension   . History of transmetatarsal amputation of left foot ([place])   . Hypoalbuminemia due to protein-calorie malnutrition ([place])   . Stage 3  chronic kidney disease ([place])   . Poorly controlled type 2 diabetes mellitus with peripheral neuropathy ([place])   . Acute blood loss anemia   . Anemia of chronic disease   . Leukocytosis   . Tobacco abuse   . Hyperkalemia 06/20/2018  . Diabetic polyneuropathy associated with type 2 diabetes mellitus ([place])   . Severe protein-calorie malnutrition ([employer])   . AKI (acute kidney injury) ([place]) 06/17/2018  . Normocytic anemia 06/17/2018  . DM2 (diabetes mellitus, type 2) ([place]) 05/14/2018  . Viral pharyngitis 01/14/2017  . Cigarette smoker 01/14/2017  . Avascular necrosis of hip, left ([place]) 10/11/2016  . Status post left hip replacement 10/11/2016  . S/P transmetatarsal amputation of foot, left ([place]) 07/25/2016  . Balance problem 07/25/2016  . Fournier's gangrene 08/16/2011  . Acute respiratory failure ([place]) 08/16/2011  . Septic shock(785.52) 08/16/2011  . Insulin dependent diabetes mellitus with complications ([place]) 72/53/6644    Past Surgical History:  Procedure Laterality Date  . AMPUTATION Left 05/10/2016   Procedure: Left Transmetatarsal Amputation;  Surgeon: [name], MD;  Location: [facility];  Service: Orthopedics;  Laterality: Left;  . AMPUTATION Right 05/16/2018   Procedure: PARTIAL RAY AMPUTATION FIFTH TOE RIGHT FOOT;  Surgeon: [name], DPM;  Location: [facility];  Service: Podiatry;  Laterality: Right;  . AMPUTATION Right 06/17/2018   Procedure: AMPUTATION 4th RAY Right Foot;  Surgeon: Trula Slade, DPM;  Location: [facility];  Service: Podiatry;  Laterality:  Right;  . AMPUTATION Right 06/19/2018   Procedure: RIGHT BELOW KNEE AMPUTATION;  Surgeon: [name], MD;  Location: [facility];  Service: Orthopedics;  Laterality: Right;  . APPLICATION OF WOUND VAC Right 06/19/2018   Procedure: APPLICATION OF WOUND VAC;  Surgeon: [name], MD;  Location: [facility];  Service: Orthopedics;  Laterality: Right;  . CARDIAC CATHETERIZATION  07/29/2018  . CIRCUMCISION  09/02/2011   Procedure:  CIRCUMCISION ADULT;  Surgeon: Molli Hazard, MD;  Location: [facility];  Service: Urology;  Laterality: N/A;  . CORONARY STENT INTERVENTION N/A 07/29/2018   Procedure: CORONARY STENT INTERVENTION;  Surgeon: Leonie Man, MD;  Location: [facility];  Service: Cardiovascular;  Laterality: N/A;  . I&D EXTREMITY  09/02/2011   Procedure: IRRIGATION AND DEBRIDEMENT EXTREMITY;  Surgeon: Theodoro Kos, DO;  Location: [facility];  Service: Plastics;  Laterality: N/A;  . INCISION AND DRAINAGE OF WOUND  08/12/2011   Procedure: IRRIGATION AND DEBRIDEMENT WOUND;  Surgeon: Zenovia Jarred, MD;  Location: [facility];  Service: General;;  perineum  . IRRIGATION AND DEBRIDEMENT ABSCESS  08/12/2011   Procedure: IRRIGATION AND DEBRIDEMENT ABSCESS;  Surgeon: Molli Hazard, MD;  Location: [facility];  Service: Urology;  Laterality: N/A;  irrigation and debridment perineum    . IRRIGATION AND DEBRIDEMENT ABSCESS  08/14/2011   Procedure: MINOR INCISION AND DRAINAGE OF ABSCESS;  Surgeon: Molli Hazard, MD;  Location: [facility];  Service: Urology;  Laterality: N/A;  Perineal Wound Debridement;Placement Bilateral Testicular Thigh Pouches  . LEFT HEART CATH AND CORONARY ANGIOGRAPHY N/A 07/29/2018   Procedure: LEFT HEART CATH AND CORONARY ANGIOGRAPHY;  Surgeon: Leonie Man, MD;  Location: [facility];  Service: Cardiovascular;  Laterality: N/A;  . TOTAL HIP ARTHROPLASTY Left 10/11/2016   Procedure: LEFT TOTAL HIP ARTHROPLASTY ANTERIOR APPROACH;  Surgeon: Mcarthur Rossetti, MD;  Location: [facility];  Service: Orthopedics;  Laterality: Left;        Home Medications    Prior to Admission medications   Medication Sig Start Date End Date Taking? Authorizing Provider  acetaminophen (TYLENOL) 325 MG tablet Take 1-2 tablets (325-650 mg total) by mouth every 6 (six) hours as needed for mild pain (pain score 1-3 or temp > 100.5). 07/01/18   Angiulli, Lavon Paganini, PA-C   ALPRAZolam Duanne Moron) 0.25 MG tablet Take 1 tablet (0.25 mg total) by mouth 3 (three) times daily as needed for anxiety. 08/04/18   Valarie Merino, MD  aspirin EC 81 MG EC tablet Take 1 tablet (81 mg total) by mouth daily. 08/01/18   Daune Perch, NP  atorvastatin (LIPITOR) 80 MG tablet Take 1 tablet (80 mg total) by mouth daily at 6 PM. 07/31/18   Daune Perch, NP  blood glucose meter kit and supplies Dispense based on patient and insurance preference. Use up to four times daily as directed. (FOR ICD-9 250.00, 250.01). 07/01/18   Angiulli, Lavon Paganini, PA-C  docusate sodium (COLACE) 100 MG capsule Take 100 mg by mouth as needed for mild constipation.    [provider]  doxycycline (VIBRAMYCIN) 100 MG capsule Take 1 capsule (100 mg total) by mouth 2 (two) times daily. 10/15/18   Rayburn, Neta Mends, PA-C  DULoxetine (CYMBALTA) 30 MG capsule Take 1 capsule (30 mg total) by mouth daily. 09/11/18   [name], MD  ezetimibe (ZETIA) 10 MG tablet Take 1 tablet (10 mg total) by mouth daily. 10/21/18 01/19/19  Lelon Perla, MD  furosemide (LASIX) 40 MG tablet Take 1 tablet (40 mg total) by mouth daily. 08/01/18   Daune Perch, NP  hydrALAZINE (APRESOLINE) 50 MG tablet Take 1 tablet (50 mg total) by mouth every 8 (eight) hours. 07/31/18   Daune Perch, NP  insulin aspart protamine - aspart (NOVOLOG MIX 70/30 FLEXPEN) (70-30) 100 UNIT/ML FlexPen Inject 0.15 mLs (15 Units total) into the skin 2 (two) times daily with a meal. 07/01/18   Angiulli, Lavon Paganini, PA-C  Insulin Pen Needle 31G X 5 MM MISC Use daily to inject insulin as instructed 07/01/18   Angiulli, Lavon Paganini, PA-C  isosorbide mononitrate (IMDUR) 30 MG 24 hr tablet Take 1 tablet (30 mg total) by mouth daily. 08/01/18   Daune Perch, NP  losartan (COZAAR) 25 MG tablet Take 1 tablet (25 mg total) by mouth daily. 10/12/18 01/10/19  Lelon Perla, MD  methocarbamol (ROBAXIN) 500 MG tablet Take 1 tablet (500 mg total) by  mouth every 6 (six) hours as needed for muscle spasms. 07/01/18   Angiulli, Lavon Paganini, PA-C  metoprolol succinate (TOPROL-XL) 50 MG 24 hr tablet Take 1 tablet (50 mg total) by mouth daily. Take with or immediately following a meal. 08/01/18   Daune Perch, NP  Multiple Vitamin (MULTIVITAMIN WITH MINERALS) TABS tablet Take 1 tablet by mouth daily. 08/01/18   Daune Perch, NP  sitaGLIPtin-metformin (JANUMET) 50-1000 MG tablet Take 1 tablet by mouth 2 (two) times daily with a meal. Patient taking differently: Take 1 tablet by mouth daily.  07/01/18   Angiulli, Lavon Paganini, PA-C  ticagrelor (BRILINTA) 90 MG TABS tablet Take 1 tablet (90 mg total) by mouth 2 (two) times daily. 07/31/18 01/27/19  Daune Perch, NP  traMADol (ULTRAM) 50 MG tablet Take 50 mg by mouth every 6 (six) hours as needed for moderate pain.    [provider]    Family History Family History  Problem Relation Age of Onset  . Diabetes Other     Social History Social History   Tobacco Use  . Smoking status: Current Every Day Smoker    Types: Cigarettes  . Smokeless tobacco: Never Used  Substance Use Topics  . Alcohol use: Yes    Alcohol/week: 0.0 standard drinks    Comment: has not had alcohol in a month  . Drug use: No     Allergies   Patient has no known allergies.   Review of Systems Review of Systems  Constitutional: Positive for fatigue. Negative for chills, diaphoresis and fever.  HENT: Negative for congestion.   Eyes: Negative for visual disturbance.  Respiratory: Positive for chest tightness and shortness of breath. Negative for cough, sputum production, choking and wheezing.   Cardiovascular: Negative for chest pain and palpitations.  Gastrointestinal: Negative for abdominal pain, constipation, diarrhea, nausea and vomiting.  Genitourinary: Negative for dysuria and flank pain.  Musculoskeletal: Negative for back pain, neck pain and neck stiffness.  Skin: Negative for rash and wound.   Neurological: Negative for dizziness, light-headedness and headaches.  Psychiatric/Behavioral: Negative for agitation.  All other systems reviewed and are negative.    Physical Exam Updated Vital Signs BP (!) 146/92 (BP Location: Right Arm)   Pulse 72   Temp (!) 97.4 F (36.3 C) (Oral)   Resp (!) 23   SpO2 94%   Physical Exam Vitals signs and nursing note reviewed.  Constitutional:      General: He is not in acute distress.    Appearance: He is well-developed. He is not ill-appearing, toxic-appearing  or diaphoretic.  HENT:     Head: Normocephalic and atraumatic.  Eyes:     Conjunctiva/sclera: Conjunctivae normal.  Neck:     Musculoskeletal: Neck supple.  Cardiovascular:     Rate and Rhythm: Normal rate and regular rhythm.     Heart sounds: No murmur.  Pulmonary:     Effort: Pulmonary effort is normal. Tachypnea present. No respiratory distress.     Breath sounds: Rales present. No decreased breath sounds, wheezing or rhonchi.  Chest:     Chest wall: No tenderness.  Abdominal:     Palpations: Abdomen is soft.     Tenderness: There is no abdominal tenderness.  Musculoskeletal:     Left lower leg: He exhibits no tenderness. Edema present.     Comments: R leg amputaqtion   Skin:    General: Skin is warm and dry.     Capillary Refill: Capillary refill takes less than 2 seconds.  Neurological:     General: No focal deficit present.     Mental Status: He is alert.      ED Treatments / Results  Labs (all labs ordered are listed, but only abnormal results are displayed) Labs Reviewed  BASIC METABOLIC PANEL - Abnormal; Notable for the following components:      Result Value   CO2 18 (*)    Glucose, Bld 132 (*)    BUN 36 (*)    Creatinine, Ser 2.06 (*)    Calcium 8.6 (*)    GFR calc non Af Amer 37 (*)    GFR calc Af Amer 43 (*)    All other components within normal limits  CBC - Abnormal; Notable for the following components:   Hemoglobin 12.0 (*)    HCT 38.1  (*)    All other components within normal limits  HEPATIC FUNCTION PANEL - Abnormal; Notable for the following components:   Albumin 3.4 (*)    All other components within normal limits  TROPONIN I - Abnormal; Notable for the following components:   Troponin I 0.05 (*)    All other components within normal limits  LIPASE, BLOOD  BRAIN NATRIURETIC PEPTIDE    EKG EKG Interpretation  Date/Time:  Monday Dec 21 2018 13:55:14 EDT Ventricular Rate:  73 PR Interval:  222 QRS Duration: 96 QT Interval:  410 QTC Calculation: 451 R Axis:   44 Text Interpretation:  Sinus rhythm with 1st degree A-V block Low voltage QRS Cannot rule out Anterior infarct , age undetermined Abnormal ECG When compared to prior, no significant changes seen.  No STEMI Confirmed by Antony Blackbird [phone]) on 12/21/2018 2:33:59 PM   Radiology Dg Chest 2 View  Result Date: 12/21/2018 CLINICAL DATA:  Shortness of breath. EXAM: CHEST - 2 VIEW COMPARISON:  Radiograph of September 24, 2018. FINDINGS: Stable cardiomediastinal silhouette. Mild central pulmonary vascular congestion is noted. Increased bibasilar opacities are noted concerning for pulmonary edema with associated pleural effusions. No pneumothorax is noted. Bony thorax is unremarkable. IMPRESSION: Stable cardiomegaly with central pulmonary vascular congestion. Increased bibasilar edema and pleural effusions is noted. Electronically Signed   By: Marijo Conception M.D.   On: 12/21/2018 14:41    Procedures Procedures (including critical care time)  CRITICAL CARE Performed by: Gwenyth Allegra Tegeler Total critical care time: 35 minutes Critical care time was exclusive of separately billable procedures and treating other patients. SOB with positive troponin for CHF exacerbation Critical care was necessary to treat or prevent imminent or life-threatening deterioration. Critical care was  time spent personally by me on the following activities: development of treatment plan  with patient and/or surrogate as well as nursing, discussions with consultants, evaluation of patient's response to treatment, examination of patient, obtaining history from patient or surrogate, ordering and performing treatments and interventions, ordering and review of laboratory studies, ordering and review of radiographic studies, pulse oximetry and re-evaluation of patient's condition.   Medications Ordered in ED Medications  sodium chloride flush (NS) 0.9 % injection 3 mL (has no administration in time range)     Initial Impression / Assessment and Plan / ED Course  I have reviewed the triage vital signs and the nursing notes.  Pertinent labs & imaging results that were available during my care of the patient were reviewed by me and considered in my medical decision making (see chart for details).  Clinical Course as of Dec 22 1103  Mon Dec 21, 2018  2031 Patient given to me in signout.  Patient with a past medical history of CHF.  Appears to be here for CHF exacerbation, mild, given IV Lasix here in the ED.  Patient declines admission.   [KM]    Clinical Course User Index [KM] Alveria Apley, PA-C       CANDLER GINSBERG is a 48 y.o. male with a past medical history significant for CAD with MI and PCI, diabetes, CKD, prior Fournier's gangrene, and congestive heart failure who presents with worsening peripheral edema and shortness of breath.  Patient reports that her last several days, he has had worsened leg swelling and shortness of breath.  He reports his symptoms are worse with shortness of breath and he cannot lay flat.  He feels he is fluid overloaded and his Lasix is not helping enough.  He does not take oxygen at home at baseline.  He reports no fevers, chills, congestion, or significant cough.  He denies chest pain.  He has a right leg amputation so is only lower leg is edematous.  He denies any history of DVT or PE.  He denies trauma.  No significant diaphoresis, nausea, or  vomiting.  No urinary symptoms or GI symptoms.  On exam, patient's lungs have crackles with rales in the bases bilaterally.  Patient is tachypneic and had oxygen saturations between 90 and 93% on monitor evaluation when he was speaking.  He cannot lay flat.  His left leg is very edematous and right leg is prosthetic.  EKG shows no STEMI.  Clinically I am most concerned about CHF exacerbation causing peripheral edema and worsened breathing.  Although patient is now hypoxic, he tried a pulse oximetry with ambulation and got extremely short of breath and symptomatic.  Chest x-ray shows fluid overload.  Initial troponin is elevated.  CBC shows no leukocytosis and mild anemia.  Hepatic function is unremarkable but creatinine is slightly worse at 2.06.  Lipase not elevated.  Low suspicion for a thromboembolic cause of his shortness of breath, and more concerned about CHF.  BNP is still in process.  Care transferred to Dr. Vanita Panda while awaiting results of BNP.  Anticipate admission for CHF exacerbation with positive troponin.     Final Clinical Impressions(s) / ED Diagnoses   Final diagnoses:  Acute on chronic congestive heart failure, unspecified heart failure type (HCC)  Exertional shortness of breath  Peripheral edema  Elevated troponin    ED Discharge Orders    None       Clinical Impression: 1. Acute on chronic congestive heart failure, unspecified heart  failure type ([place])   2. Exertional shortness of breath   3. Peripheral edema   4. Elevated troponin     Disposition: Care transferred to Dr. Vanita Panda while awaiting results of BNP.  Anticipate admission for CHF exacerbation with worsening kidney function and positive troponin.  This note was prepared with assistance of Systems analyst. Occasional wrong-word or sound-a-like substitutions may have occurred due to the inherent limitations of voice recognition software.  \   Tegeler, Gwenyth Allegra, MD  12/21/18 1607    Tegeler, Gwenyth Allegra, MD 12/22/18 1105

## 2018-12-21 NOTE — ED Provider Notes (Signed)
6:35 PM Remaining labs available, discussed with the patient. With concern for worsening heart failure, the patient will receive IV Lasix. I advised the patient that admission is indicated, though the patient is hesitant to stay in the hospital. We agreed the patient will have initiation of IV diuresis, and should improve, disposition will be reconsidered.  Chart review is clear the patient did not have substantial improvement was admitted for further diuresis, management.   Carmin Muskrat, MD 12/22/18 Laureen Abrahams

## 2018-12-21 NOTE — ED Notes (Signed)
ED Provider at bedside. 

## 2018-12-21 NOTE — ED Notes (Signed)
ED TO INPATIENT HANDOFF REPORT  ED Nurse Name and Phone #: [phone]  S Name/Age/Gender Edward [name] 48 y.o. male Room/Bed: 041C/041C  Code Status   Code Status: Prior  Home/SNF/Other Home Patient oriented to: self, place, time and situation Is this baseline? Yes   Triage Complete: Triage complete  Chief Complaint poss fluid on lungs  Triage Note Pt with shortness of breath and left leg swelling with side pain hx of CHF takes lasix. Denies chest pain or fevers.    Allergies No Known Allergies  Level of Care/Admitting Diagnosis ED Disposition    ED Disposition Condition [facility] Area: [place] [100100]  Level of Care: Telemetry Cardiac [103]  I expect the patient will be discharged within 24 hours: No (not a candidate for 5C-Observation unit)  Covid Evaluation: N/A  Diagnosis: Acute exacerbation of CHF (congestive heart failure) [facility]) [195093]  Admitting Physician: Shela Leff [[id number]]  Attending Physician: Shela Leff [[id number]]  PT Class (Do Not Modify): Observation [104]  PT Acc Code (Do Not Modify): Observation [10022]       B Medical/Surgery History Past Medical History:  Diagnosis Date  . Anxiety    self reported  . Arthritis   . CHF (congestive heart failure) ([place])   . Coronary artery disease   . Depression    self reported  . Diabetes mellitus   . Hypertension    Past Surgical History:  Procedure Laterality Date  . AMPUTATION Left 05/10/2016   Procedure: Left Transmetatarsal Amputation;  Surgeon: [name], MD;  Location: [place];  Service: Orthopedics;  Laterality: Left;  . AMPUTATION Right 05/16/2018   Procedure: PARTIAL RAY AMPUTATION FIFTH TOE RIGHT FOOT;  Surgeon: [name], DPM;  Location: [place];  Service: Podiatry;  Laterality: Right;  . AMPUTATION Right 06/17/2018   Procedure: AMPUTATION 4th RAY Right Foot;  Surgeon: Trula Slade, DPM;  Location: [place];  Service: Podiatry;   Laterality: Right;  . AMPUTATION Right 06/19/2018   Procedure: RIGHT BELOW KNEE AMPUTATION;  Surgeon: [name], MD;  Location: [place];  Service: Orthopedics;  Laterality: Right;  . APPLICATION OF WOUND VAC Right 06/19/2018   Procedure: APPLICATION OF WOUND VAC;  Surgeon: [name], MD;  Location: [place];  Service: Orthopedics;  Laterality: Right;  . CARDIAC CATHETERIZATION  07/29/2018  . CIRCUMCISION  09/02/2011   Procedure: CIRCUMCISION ADULT;  Surgeon: Molli Hazard, MD;  Location: [place];  Service: Urology;  Laterality: N/A;  . CORONARY STENT INTERVENTION N/A 07/29/2018   Procedure: CORONARY STENT INTERVENTION;  Surgeon: Leonie Man, MD;  Location: [place] [facility];  Service: Cardiovascular;  Laterality: N/A;  . I&D EXTREMITY  09/02/2011   Procedure: IRRIGATION AND DEBRIDEMENT EXTREMITY;  Surgeon: Theodoro Kos, DO;  Location: [place];  Service: Plastics;  Laterality: N/A;  . INCISION AND DRAINAGE OF WOUND  08/12/2011   Procedure: IRRIGATION AND DEBRIDEMENT WOUND;  Surgeon: Zenovia Jarred, MD;  Location: [place];  Service: General;;  perineum  . IRRIGATION AND DEBRIDEMENT ABSCESS  08/12/2011   Procedure: IRRIGATION AND DEBRIDEMENT ABSCESS;  Surgeon: Molli Hazard, MD;  Location: [place];  Service: Urology;  Laterality: N/A;  irrigation and debridment perineum    . IRRIGATION AND DEBRIDEMENT ABSCESS  08/14/2011   Procedure: MINOR INCISION AND DRAINAGE OF ABSCESS;  Surgeon: Molli Hazard, MD;  Location: [place];  Service: Urology;  Laterality: N/A;  Perineal Wound  Debridement;Placement Bilateral Testicular Thigh Pouches  . LEFT HEART CATH AND CORONARY ANGIOGRAPHY N/A 07/29/2018   Procedure: LEFT HEART CATH AND CORONARY ANGIOGRAPHY;  Surgeon: Leonie Man, MD;  Location: [place] [facility];  Service: Cardiovascular;  Laterality: N/A;  . TOTAL HIP ARTHROPLASTY Left 10/11/2016   Procedure: LEFT TOTAL HIP ARTHROPLASTY ANTERIOR  APPROACH;  Surgeon: Mcarthur Rossetti, MD;  Location: [facility];  Service: Orthopedics;  Laterality: Left;     A IV Location/Drains/Wounds Patient Lines/Drains/Airways Status   Active Line/Drains/Airways    Name:   Placement date:   Placement time:   Site:   Days:   Peripheral IV 12/21/18 Left Hand   12/21/18    1731    Hand   less than 1   Negative Pressure Wound Therapy Foot Left   05/10/16    1614    -   955   Negative Pressure Wound Therapy Foot Right   05/16/18    1543    -   219   External Urinary Catheter   07/28/18    0019    -   146   Incision (Closed) 05/10/16 Leg Left   05/10/16    1620     955   Incision (Closed) 10/11/16 Hip Left   10/11/16    1449     801   Incision (Closed) 05/16/18 Foot Right   05/16/18    1551     219   Incision (Closed) 06/19/18 Leg Right   06/19/18    1310     185   Wound / Incision (Open or Dehisced) 02/19/14 Other (Comment)   02/19/14    2025    -   1766   Wound / Incision (Open or Dehisced) 05/08/16 Diabetic ulcer Foot   05/08/16    0130    Foot   957          Intake/Output Last 24 hours  Intake/Output Summary (Last 24 hours) at 12/21/2018 2238 Last data filed at 12/21/2018 1948 Gross per 24 hour  Intake 275 ml  Output 200 ml  Net 75 ml    Labs/Imaging Results for orders placed or performed during the hospital encounter of 12/21/18 (from the past 48 hour(s))  Basic metabolic panel     Status: Abnormal   Collection Time: 12/21/18  2:24 PM  Result Value Ref Range   Sodium 138 135 - 145 mmol/L   Potassium 3.9 3.5 - 5.1 mmol/L   Chloride 110 98 - 111 mmol/L   CO2 18 (L) 22 - 32 mmol/L   Glucose, Bld 132 (H) 70 - 99 mg/dL   BUN 36 (H) 6 - 20 mg/dL   Creatinine, Ser 2.06 (H) 0.61 - 1.24 mg/dL   Calcium 8.6 (L) 8.9 - 10.3 mg/dL   GFR calc non Af Amer 37 (L) >60 mL/min   GFR calc Af Amer 43 (L) >60 mL/min   Anion gap 10 5 - 15    Comment: Performed at [facility], [address], [place], [place] [address]  CBC     Status: Abnormal    Collection Time: 12/21/18  2:24 PM  Result Value Ref Range   WBC 5.8 4.0 - 10.5 K/uL   RBC 4.38 4.22 - 5.81 MIL/uL   Hemoglobin 12.0 (L) 13.0 - 17.0 g/dL   HCT 38.1 (L) 39.0 - 52.0 %   MCV 87.0 80.0 - 100.0 fL   MCH 27.4 26.0 - 34.0 pg   MCHC  31.5 30.0 - 36.0 g/dL   RDW 13.8 11.5 - 15.5 %   Platelets 230 150 - 400 K/uL   nRBC 0.0 0.0 - 0.2 %    Comment: Performed at [facility], [place] [address], [place], [place] [address]  Hepatic function panel     Status: Abnormal   Collection Time: 12/21/18  2:50 PM  Result Value Ref Range   Total Protein 6.5 6.5 - 8.1 g/dL   Albumin 3.4 (L) 3.5 - 5.0 g/dL   AST 17 15 - 41 U/L   ALT 16 0 - 44 U/L   Alkaline Phosphatase 47 38 - 126 U/L   Total Bilirubin 0.9 0.3 - 1.2 mg/dL   Bilirubin, Direct <0.1 0.0 - 0.2 mg/dL   Indirect Bilirubin NOT CALCULATED 0.3 - 0.9 mg/dL    Comment: Performed at [facility] [address], [place], [place] [address]  Lipase, blood     Status: None   Collection Time: 12/21/18  2:50 PM  Result Value Ref Range   Lipase 27 11 - 51 U/L    Comment: Performed at [facility], [place] [address], [place], [place] [address]  Troponin I - ONCE - STAT     Status: Abnormal   Collection Time: 12/21/18  2:50 PM  Result Value Ref Range   Troponin I 0.05 (HH) <0.03 ng/mL    Comment: CRITICAL RESULT CALLED TO, READ BACK BY AND VERIFIED WITH: M.FOUNTAIN RN 1545 12/21/2018 MCCORMICK K Performed at [facility], [place] [address] [place], [place] [address]   Brain natriuretic peptide     Status: Abnormal   Collection Time: 12/21/18  2:50 PM  Result Value Ref Range   B Natriuretic Peptide 631.7 (H) 0.0 - 100.0 pg/mL    Comment: Performed at [facility] [address] [place], [place] [address]  SARS Coronavirus 2 [facility] order, Performed in [facility] hospital lab)     Status: None   Collection Time: 12/21/18  8:56 PM  Result Value Ref Range   SARS Coronavirus 2 NEGATIVE NEGATIVE    Comment:  (NOTE) If result is NEGATIVE SARS-CoV-2 target nucleic acids are NOT DETECTED. The SARS-CoV-2 RNA is generally detectable in upper and lower  respiratory specimens during the acute phase of infection. The lowest  concentration of SARS-CoV-2 viral copies this assay can detect is 250  copies / mL. A negative result does not preclude SARS-CoV-2 infection  and should not be used as the sole basis for treatment or other  patient management decisions.  A negative result may occur with  improper specimen collection / handling, submission of specimen other  than nasopharyngeal swab, presence of viral mutation(s) within the  areas targeted by this assay, and inadequate number of viral copies  (<250 copies / mL). A negative result must be combined with clinical  observations, patient history, and epidemiological information. If result is POSITIVE SARS-CoV-2 target nucleic acids are DETECTED. The SARS-CoV-2 RNA is generally detectable in upper and lower  respiratory specimens dur ing the acute phase of infection.  Positive  results are indicative of active infection with SARS-CoV-2.  Clinical  correlation with patient history and other diagnostic information is  necessary to determine patient infection status.  Positive results do  not rule out bacterial infection or co-infection with other viruses. If result is PRESUMPTIVE POSTIVE SARS-CoV-2 nucleic acids MAY BE PRESENT.   A presumptive positive result was obtained on the submitted specimen  and  confirmed on repeat testing.  While 2019 novel coronavirus  (SARS-CoV-2) nucleic acids may be present in the submitted sample  additional confirmatory testing may be necessary for epidemiological  and / or clinical management purposes  to differentiate between  SARS-CoV-2 and other Sarbecovirus currently known to infect humans.  If clinically indicated additional testing with an alternate test  methodology [phone]) is advised. The SARS-CoV-2 RNA is  generally  detectable in upper and lower respiratory sp ecimens during the acute  phase of infection. The expected result is Negative. Fact Sheet for Patients:  StrictlyIdeas.no Fact Sheet for Healthcare Providers: BankingDealers.co.za This test is not yet approved or cleared by the [place] FDA and has been authorized for detection and/or diagnosis of SARS-CoV-2 by FDA under an Emergency Use Authorization (EUA).  This EUA will remain in effect (meaning this test can be used) for the duration of the COVID-19 declaration under Section 564(b)(1) of the Act, 21 U.S.C. section 360bbb-3(b)(1), unless the authorization is terminated or revoked sooner. Performed at [facility], [place] [address], [place], [place] [address]    Dg Chest 2 View  Result Date: 12/21/2018 CLINICAL DATA:  Shortness of breath. EXAM: CHEST - 2 VIEW COMPARISON:  Radiograph of September 24, 2018. FINDINGS: Stable cardiomediastinal silhouette. Mild central pulmonary vascular congestion is noted. Increased bibasilar opacities are noted concerning for pulmonary edema with associated pleural effusions. No pneumothorax is noted. Bony thorax is unremarkable. IMPRESSION: Stable cardiomegaly with central pulmonary vascular congestion. Increased bibasilar edema and pleural effusions is noted. Electronically Signed   By: Marijo Conception M.D.   On: 12/21/2018 14:41    Pending Labs Unresulted Labs (From admission, onward)   None      Vitals/Pain Today's Vitals   12/21/18 2030 12/21/18 2045 12/21/18 2100 12/21/18 2110  BP: (!) 176/98 (!) 180/107 (!) 166/95 (!) 168/95  Pulse: 85   83  Resp: (!) 22 (!) 24 (!) 30 (!) 22  Temp:      TempSrc:      SpO2: 100%   95%  PainSc:        Isolation Precautions Droplet and Contact precautions  Medications Medications  sodium chloride flush (NS) 0.9 % injection 3 mL (3 mLs Intravenous Given 12/21/18 1732)  furosemide (LASIX)  injection 60 mg (60 mg Intravenous Given 12/21/18 1837)    Mobility walks     Focused Assessments    R Recommendations: See Admitting Provider Note  Report given to:   Additional Notes:

## 2018-12-21 NOTE — ED Triage Notes (Signed)
Pt with shortness of breath and left leg swelling with side pain hx of CHF takes lasix. Denies chest pain or fevers.

## 2018-12-21 NOTE — ED Provider Notes (Signed)
  Physical Exam  BP (!) 168/95 (BP Location: Left Arm)   Pulse 83   Temp (!) 97.4 F (36.3 C) (Oral)   Resp (!) 22   SpO2 95%   Physical Exam  ED Course/Procedures   Clinical Course as of Dec 20 2137  Mon Dec 21, 2018  2031 Patient given to me in signout.  Patient with a past medical history of CHF.  Appears to be here for CHF exacerbation, mild, given IV Lasix here in the ED.  Patient declines admission.   [KM]    Clinical Course User Index [KM] Kristine Royal    Procedures  MDM  48 year old male with past medical history of CHF presents the emergency department for shortness of breath.  Appears to be in fluid overload.  No chest pain.  Work-up revealed elevated BNP, chest x-ray consistent with CHF exacerbation.  Patient low 90s on room air but feeling short of breath.  Was given 60 of IV Lasix with a little bit of urine output but after several hours still feeling short of breath and "full of fluid".  Consulted hospitalist for admission.  Waited 40 minutes for hospitalist consult.  Paged twice.  Passed patient onto orange pod to take call from hospitalist for admission. Patient stable without chest pain currently      Kristine Royal 12/21/18 2140    Carmin Muskrat, MD 12/22/18 Laureen Abrahams

## 2018-12-21 NOTE — ED Notes (Signed)
Per previous RN, patient placed on 2L via [place] of oxygen low saturation.

## 2018-12-21 NOTE — ED Provider Notes (Signed)
  Physical Exam  BP (!) 168/95 (BP Location: Left Arm)   Pulse 83   Temp (!) 97.4 F (36.3 C) (Oral)   Resp (!) 22   SpO2 95%    ED Course/Procedures    MDM   Patient is a 48 year old with a history of CAD with prior MI and PCI, diabetes, CKD, CHF who presents with worsening peripheral edema and shortness of breath.   Concerns for CHF exacerbation.  BNP is 631. Troponin 0.05.  On my assessment, he is sitting on the edge of the bed.  He is on 2.5 L nasal cannula oxygen.  No increased work of breathing.  He has started to urinate after receiving Lasix.  SARS-CoV-2 negative.  Patient admitted to the hospitalist for CHF exacerbation management.         Trinidad Curet, MD 12/22/18 1305    Davonna Belling, MD 12/22/18 [phone]

## 2018-12-22 ENCOUNTER — Encounter: Payer: Medicaid Other | Admitting: Physical Therapy

## 2018-12-22 ENCOUNTER — Encounter (HOSPITAL_COMMUNITY): Payer: Self-pay

## 2018-12-22 ENCOUNTER — Observation Stay (HOSPITAL_COMMUNITY): Payer: Medicaid Other

## 2018-12-22 DIAGNOSIS — R7989 Other specified abnormal findings of blood chemistry: Secondary | ICD-10-CM | POA: Diagnosis not present

## 2018-12-22 DIAGNOSIS — E1122 Type 2 diabetes mellitus with diabetic chronic kidney disease: Secondary | ICD-10-CM

## 2018-12-22 DIAGNOSIS — F329 Major depressive disorder, single episode, unspecified: Secondary | ICD-10-CM | POA: Diagnosis present

## 2018-12-22 DIAGNOSIS — N179 Acute kidney failure, unspecified: Secondary | ICD-10-CM | POA: Diagnosis not present

## 2018-12-22 DIAGNOSIS — I509 Heart failure, unspecified: Secondary | ICD-10-CM | POA: Diagnosis not present

## 2018-12-22 DIAGNOSIS — R7401 Elevation of levels of liver transaminase levels: Secondary | ICD-10-CM

## 2018-12-22 DIAGNOSIS — E785 Hyperlipidemia, unspecified: Secondary | ICD-10-CM | POA: Diagnosis present

## 2018-12-22 DIAGNOSIS — Z96642 Presence of left artificial hip joint: Secondary | ICD-10-CM | POA: Diagnosis present

## 2018-12-22 DIAGNOSIS — I252 Old myocardial infarction: Secondary | ICD-10-CM | POA: Diagnosis not present

## 2018-12-22 DIAGNOSIS — Z833 Family history of diabetes mellitus: Secondary | ICD-10-CM | POA: Diagnosis not present

## 2018-12-22 DIAGNOSIS — I2510 Atherosclerotic heart disease of native coronary artery without angina pectoris: Secondary | ICD-10-CM | POA: Diagnosis present

## 2018-12-22 DIAGNOSIS — R609 Edema, unspecified: Secondary | ICD-10-CM | POA: Diagnosis not present

## 2018-12-22 DIAGNOSIS — Z79899 Other long term (current) drug therapy: Secondary | ICD-10-CM | POA: Diagnosis not present

## 2018-12-22 DIAGNOSIS — Z89432 Acquired absence of left foot: Secondary | ICD-10-CM | POA: Diagnosis not present

## 2018-12-22 DIAGNOSIS — I130 Hypertensive heart and chronic kidney disease with heart failure and stage 1 through stage 4 chronic kidney disease, or unspecified chronic kidney disease: Secondary | ICD-10-CM | POA: Diagnosis present

## 2018-12-22 DIAGNOSIS — I340 Nonrheumatic mitral (valve) insufficiency: Secondary | ICD-10-CM | POA: Diagnosis not present

## 2018-12-22 DIAGNOSIS — Z9111 Patient's noncompliance with dietary regimen: Secondary | ICD-10-CM | POA: Diagnosis not present

## 2018-12-22 DIAGNOSIS — I361 Nonrheumatic tricuspid (valve) insufficiency: Secondary | ICD-10-CM

## 2018-12-22 DIAGNOSIS — Z7982 Long term (current) use of aspirin: Secondary | ICD-10-CM | POA: Diagnosis not present

## 2018-12-22 DIAGNOSIS — Z955 Presence of coronary angioplasty implant and graft: Secondary | ICD-10-CM | POA: Diagnosis not present

## 2018-12-22 DIAGNOSIS — I5043 Acute on chronic combined systolic (congestive) and diastolic (congestive) heart failure: Secondary | ICD-10-CM | POA: Diagnosis not present

## 2018-12-22 DIAGNOSIS — N183 Chronic kidney disease, stage 3 (moderate): Secondary | ICD-10-CM | POA: Diagnosis present

## 2018-12-22 DIAGNOSIS — I5023 Acute on chronic systolic (congestive) heart failure: Secondary | ICD-10-CM | POA: Diagnosis not present

## 2018-12-22 DIAGNOSIS — Z9114 Patient's other noncompliance with medication regimen: Secondary | ICD-10-CM | POA: Diagnosis not present

## 2018-12-22 DIAGNOSIS — Z794 Long term (current) use of insulin: Secondary | ICD-10-CM | POA: Diagnosis not present

## 2018-12-22 DIAGNOSIS — E1151 Type 2 diabetes mellitus with diabetic peripheral angiopathy without gangrene: Secondary | ICD-10-CM | POA: Diagnosis present

## 2018-12-22 DIAGNOSIS — Z20828 Contact with and (suspected) exposure to other viral communicable diseases: Secondary | ICD-10-CM | POA: Diagnosis present

## 2018-12-22 DIAGNOSIS — I248 Other forms of acute ischemic heart disease: Secondary | ICD-10-CM | POA: Diagnosis present

## 2018-12-22 DIAGNOSIS — F17210 Nicotine dependence, cigarettes, uncomplicated: Secondary | ICD-10-CM | POA: Diagnosis present

## 2018-12-22 DIAGNOSIS — F419 Anxiety disorder, unspecified: Secondary | ICD-10-CM | POA: Diagnosis present

## 2018-12-22 DIAGNOSIS — Z89511 Acquired absence of right leg below knee: Secondary | ICD-10-CM | POA: Diagnosis not present

## 2018-12-22 LAB — GLUCOSE, CAPILLARY
Glucose-Capillary: 120 mg/dL — ABNORMAL HIGH (ref 70–99)
Glucose-Capillary: 130 mg/dL — ABNORMAL HIGH (ref 70–99)
Glucose-Capillary: 143 mg/dL — ABNORMAL HIGH (ref 70–99)
Glucose-Capillary: 208 mg/dL — ABNORMAL HIGH (ref 70–99)
Glucose-Capillary: 93 mg/dL (ref 70–99)

## 2018-12-22 LAB — BASIC METABOLIC PANEL
Anion gap: 13 (ref 5–15)
BUN: 35 mg/dL — ABNORMAL HIGH (ref 6–20)
CO2: 17 mmol/L — ABNORMAL LOW (ref 22–32)
Calcium: 8.7 mg/dL — ABNORMAL LOW (ref 8.9–10.3)
Chloride: 110 mmol/L (ref 98–111)
Creatinine, Ser: 1.99 mg/dL — ABNORMAL HIGH (ref 0.61–1.24)
GFR calc Af Amer: 44 mL/min — ABNORMAL LOW (ref >60)
GFR calc non Af Amer: 38 mL/min — ABNORMAL LOW (ref >60)
Glucose, Bld: 143 mg/dL — ABNORMAL HIGH (ref 70–99)
Potassium: 3.8 mmol/L (ref 3.5–5.1)
Sodium: 140 mmol/L (ref 135–145)

## 2018-12-22 LAB — HEMOGLOBIN A1C
Hgb A1c MFr Bld: 7.5 % — ABNORMAL HIGH (ref 4.8–5.6)
Mean Plasma Glucose: 168.55 mg/dL

## 2018-12-22 LAB — ECHOCARDIOGRAM COMPLETE
Height: 73 in
Weight: 3686.09 oz

## 2018-12-22 LAB — TROPONIN I
Troponin I: 0.06 ng/mL (ref <0.03)
Troponin I: 0.05 ng/mL (ref <0.03)

## 2018-12-22 MED ORDER — INSULIN ASPART PROT & ASPART (70-30 MIX) 100 UNIT/ML PEN: 15.0000 [IU] | PEN_INJECTOR | Freq: Two times a day (BID) | SUBCUTANEOUS | Status: DC

## 2018-12-22 MED ORDER — ASPIRIN EC 81 MG PO TBEC
81.0000 mg | DELAYED_RELEASE_TABLET | Freq: Every day | ORAL | Status: DC
  Administered 2018-12-22 – 2018-12-24 (×3): 81 mg via ORAL
  Filled 2018-12-22 (×3): qty 1

## 2018-12-22 MED ORDER — ADULT MULTIVITAMIN W/MINERALS CH
1.0000 | ORAL_TABLET | Freq: Every day | ORAL | Status: DC
  Administered 2018-12-22 – 2018-12-24 (×3): 1 via ORAL
  Filled 2018-12-22 (×3): qty 1

## 2018-12-22 MED ORDER — ALPRAZOLAM 0.25 MG PO TABS
0.2500 mg | ORAL_TABLET | Freq: Three times a day (TID) | ORAL | Status: DC | PRN
  Administered 2018-12-22 – 2018-12-23 (×2): 0.25 mg via ORAL
  Filled 2018-12-22 (×2): qty 1

## 2018-12-22 MED ORDER — DULOXETINE HCL 30 MG PO CPEP
30.0000 mg | ORAL_CAPSULE | Freq: Every day | ORAL | Status: DC
  Administered 2018-12-22 – 2018-12-24 (×3): 30 mg via ORAL
  Filled 2018-12-22 (×3): qty 1

## 2018-12-22 MED ORDER — METOPROLOL SUCCINATE ER 50 MG PO TB24
50.0000 mg | ORAL_TABLET | Freq: Every day | ORAL | Status: DC
  Administered 2018-12-22 – 2018-12-24 (×3): 50 mg via ORAL
  Filled 2018-12-22 (×3): qty 1

## 2018-12-22 MED ORDER — HYDRALAZINE HCL 50 MG PO TABS
50.0000 mg | ORAL_TABLET | Freq: Three times a day (TID) | ORAL | Status: DC
  Administered 2018-12-22 – 2018-12-24 (×6): 50 mg via ORAL
  Filled 2018-12-22 (×6): qty 1

## 2018-12-22 MED ORDER — TICAGRELOR 90 MG PO TABS
90.0000 mg | ORAL_TABLET | Freq: Two times a day (BID) | ORAL | Status: DC
  Administered 2018-12-22 – 2018-12-24 (×5): 90 mg via ORAL
  Filled 2018-12-22 (×5): qty 1

## 2018-12-22 MED ORDER — METHOCARBAMOL 500 MG PO TABS: 500.0000 mg | ORAL_TABLET | Freq: Four times a day (QID) | ORAL | Status: DC | PRN

## 2018-12-22 MED ORDER — ATORVASTATIN CALCIUM 80 MG PO TABS
80.0000 mg | ORAL_TABLET | Freq: Every day | ORAL | Status: DC
  Administered 2018-12-22 – 2018-12-23 (×2): 80 mg via ORAL
  Filled 2018-12-22 (×2): qty 1

## 2018-12-22 MED ORDER — ISOSORBIDE MONONITRATE ER 30 MG PO TB24
30.0000 mg | ORAL_TABLET | Freq: Every day | ORAL | Status: DC
  Administered 2018-12-22 – 2018-12-24 (×3): 30 mg via ORAL
  Filled 2018-12-22 (×3): qty 1

## 2018-12-22 MED ORDER — ACETAMINOPHEN 325 MG PO TABS: 325.0000 mg | ORAL_TABLET | Freq: Four times a day (QID) | ORAL | Status: DC | PRN

## 2018-12-22 MED ORDER — DOCUSATE SODIUM 100 MG PO CAPS: 100.0000 mg | ORAL_CAPSULE | ORAL | Status: DC | PRN

## 2018-12-22 MED ORDER — EZETIMIBE 10 MG PO TABS
10.0000 mg | ORAL_TABLET | Freq: Every day | ORAL | Status: DC
  Administered 2018-12-22 – 2018-12-24 (×3): 10 mg via ORAL
  Filled 2018-12-22 (×3): qty 1

## 2018-12-22 MED ORDER — INSULIN ASPART PROT & ASPART (70-30 MIX) 100 UNIT/ML ~~LOC~~ SUSP
15.0000 [IU] | Freq: Two times a day (BID) | SUBCUTANEOUS | Status: DC
  Administered 2018-12-22 – 2018-12-24 (×4): 15 [IU] via SUBCUTANEOUS
  Filled 2018-12-22: qty 10

## 2018-12-22 NOTE — Progress Notes (Signed)
  Echocardiogram 2D Echocardiogram has been performed.  Bernerd Terhune L Androw 12/22/2018, 8:22 AM

## 2018-12-22 NOTE — Plan of Care (Signed)
  Problem: Education: Goal: Knowledge of General Education information will improve Description: Including pain rating scale, medication(s)/side effects and non-pharmacologic comfort measures Outcome: Progressing   Problem: Health Behavior/Discharge Planning: Goal: Ability to manage health-related needs will improve Outcome: Progressing   Problem: Clinical Measurements: Goal: Respiratory complications will improve Outcome: Progressing   

## 2018-12-22 NOTE — TOC Initial Note (Signed)
Transition of Care [facility]) - Initial/Assessment Note    Patient Details  Name: Edward [name] MRN: [id number] Date of Birth: 06-14-1971  [facility] of [facility]) CM/SW Contact:    Royston Bake, RN Phone Number: 12/22/2018, 10:21 AM  Clinical Narrative:                 Patient lives at home; PCP: [name], MD; has private insurance with Medicaid with prescription drug coverage. CM will continue to follow for progression of care.   Expected Discharge Plan: Home/Self Care Barriers to Discharge: No Barriers Identified   Patient Goals and CMS Choice     Choice offered to / list presented to : NA  Expected Discharge Plan and Services Expected Discharge Plan: Home/Self Care   Discharge Planning Services: CM Consult Post Acute Care Choice: NA Living arrangements for the past 2 months: Single Family Home                 DME Arranged: N/A DME Agency: NA       HH Arranged: NA Ward Agency: NA        Prior Living Arrangements/Services Living arrangements for the past 2 months: Single Family Home Lives with:: Relatives                   Activities of Daily Living Home Assistive Devices/Equipment: Prosthesis ADL Screening (condition at time of admission) Patient's cognitive ability adequate to safely complete daily activities?: Yes Is the patient deaf or have difficulty hearing?: No Does the patient have difficulty seeing, even when wearing glasses/contacts?: No Does the patient have difficulty concentrating, remembering, or making decisions?: No Patient able to express need for assistance with ADLs?: No Does the patient have difficulty dressing or bathing?: No Independently performs ADLs?: Yes (appropriate for developmental age) Does the patient have difficulty walking or climbing stairs?: Yes Weakness of Legs: None Weakness of Arms/Hands: None  Permission Sought/Granted                  Emotional Assessment         Alcohol / Substance Use: Not  Applicable Psych Involvement: No (comment)  Admission diagnosis:  Peripheral edema [R60.9] Elevated troponin [R79.89] Exertional shortness of breath [R06.02] Acute on chronic congestive heart failure, unspecified heart failure type ([place]) [I50.9] Patient Active Problem List   Diagnosis Date Noted  . Elevated troponin 12/22/2018  . Acute exacerbation of CHF (congestive heart failure) ([place]) 12/21/2018  . Gait abnormality 09/11/2018  . CAD S/P percutaneous coronary angioplasty 08/06/2018  . Cardiomyopathy ([place]) 08/06/2018  . PVD (peripheral vascular disease) ([place]) 08/06/2018  . Shortness of breath   . Acute on chronic combined systolic and diastolic CHF, NYHA class 2 ([place])   . Non-ST elevation (NSTEMI) myocardial infarction ([place]) 07/28/2018  . Respiratory failure with hypoxia ([place]) 07/28/2018  . Acute heart failure ([place]) 07/28/2018  . Hypertensive emergency 07/28/2018  . S/P BKA (below knee amputation) unilateral, right ([place]) 07/15/2018  . Hypoglycemia   . Anxiety about health   . Labile blood glucose   . Essential hypertension   . History of transmetatarsal amputation of left foot ([place])   . Hypoalbuminemia due to protein-calorie malnutrition ([place])   . Stage 3 chronic kidney disease ([place])   . Poorly controlled type 2 diabetes mellitus with peripheral neuropathy ([place])   . Acute blood loss anemia   . Anemia of chronic disease   . Leukocytosis   . Tobacco abuse   . Hyperkalemia 06/20/2018  .  Diabetic polyneuropathy associated with type 2 diabetes mellitus ([place])   . Severe protein-calorie malnutrition ([place])   . AKI (acute kidney injury) ([place]) 06/17/2018  . Normocytic anemia 06/17/2018  . DM2 (diabetes mellitus, type 2) ([place]) 05/14/2018  . Viral pharyngitis 01/14/2017  . Cigarette smoker 01/14/2017  . Avascular necrosis of hip, left ([place]) 10/11/2016  . Status post left hip replacement 10/11/2016  . S/P transmetatarsal amputation of foot, left ([place]) 07/25/2016  . Balance problem  07/25/2016  . Fournier's gangrene 08/16/2011  . Acute respiratory failure ([place]) 08/16/2011  . Septic shock(785.52) 08/16/2011  . Insulin dependent diabetes mellitus with complications ([place]) 10/93/2355   PCP:  [name], MD Pharmacy:   [facility] [place]. [address] [place] [address] Phone: [phone] Fax: [phone]     Social Determinants of Health (SDOH) Interventions    Readmission Risk Interventions No flowsheet data found.

## 2018-12-22 NOTE — Progress Notes (Signed)
PROGRESS NOTE    Edward [name]  BJS:[id number] DOB: 07/12/1971 DOA: 12/21/2018 PCP: [name], MD    Brief Narrative:  Edward [name] is a 48 y.o. male with medical history significant of chronic combined systolic and diastolic congestive heart failure, CAD status post recent PCI, type 2 diabetes, hypertension presenting to the hospital for evaluation of shortness of breath. Patient reported 2-days history of shortness of breath, orthopnea, and lower extremity edema.  Does endorse increased dietary fluid and salt intake.  States he took 3 tablets of Lasix 40 mg yesterday and 2 tablets today. Reports noncompliance with home blood pressure medications.  Denies any fevers, chills, chest pain, sick contacts, recent travel, or exposure to any individual with confirmed or suspected COVID-19.  Patient is admitted with congestive heart failure exacerbation.   Assessment & Plan:   Principal Problem:   Acute exacerbation of CHF (congestive heart failure) (HCC) Active Problems:   DM2 (diabetes mellitus, type 2) (HCC)   AKI (acute kidney injury) ([place])   Essential hypertension   Elevated troponin   CHF (congestive heart failure) (HCC)  Acute exacerbation of chronic combined systolic and diastolic heart failure: BNP 631.  Patient does have clinical evidence of peripheral edema and pleural effusion.  Previous echocardiogram on 07/2018 with ejection fraction of 45% and grade 1 diastolic dysfunction. -Continue IV Lasix 40 mg twice a day.  Intake and output monitoring.  Daily weight.  CHF education. -Discussed compliance to medications and diet. -Repeat echocardiogram today ordered.  Results pending.  Acute kidney injury on chronic kidney disease stage III: -Baseline creatinine about 1.6.  Presented with creatinine of 2.  Creatinine 1.99 today. -Hopefully this will improve with diuresis.  Will recheck in the morning.  Continue intake and output monitoring.  Mild troponin elevation: With history of  coronary artery disease and drug-eluting stent placement. -With no ischemic changes on EKG or chest pain.  Probably due to congestive heart failure.  Remains flat. -Resume his aspirin, Brilinta and statin and beta-blockers.  He is also on hydralazine and isosorbide dinitrate that he will resume.  Denies any chest pain.   Hypertension:  -Blood pressures were elevated on presentation.  Resume home medications and monitor.  Type 2 diabetes: -A1c 7.5.  Will resume home insulin.  Keep on sliding scale insulin.  Patient has significant symptoms of peripheral edema, congestive heart failure exacerbation needing IV diuretics and treatment in the hospital.  He is a high risk of decompensation without hospitalization and treatment.  Will change to inpatient.   DVT prophylaxis: Subcu heparin Code Status: Full code Family Communication: None Disposition Plan: Inpatient, cardiac telemetry   Consultants:   None  Procedures:   None, echo pending  Antimicrobials:   None.   Subjective: Patient was seen and examined.  Patient stated gaining weight and leg swelling secondary to dietary indiscretions. Denies any chest pain.  No cough or fever. Urine output more than 2 L overnight.  Objective: Vitals:   12/22/18 0206 12/22/18 0435 12/22/18 0844 12/22/18 0928  BP:  (!) 157/81 (!) 164/96 137/78  Pulse:  95 83 86  Resp:  20 18   Temp:  98.4 F (36.9 C) 97.9 F (36.6 C)   TempSrc:  Oral Oral   SpO2:  92% 94%   Weight: 104.5 kg     Height: 6\' 1"  (1.854 m)       Intake/Output Summary (Last 24 hours) at 12/22/2018 1141 Last data filed at 12/22/2018 0900 Gross per 24 hour  Intake 515 ml  Output 2750 ml  Net -2235 ml   Filed Weights   12/22/18 0206  Weight: 104.5 kg    Examination:  General exam: Appears calm and comfortable, on room air. Respiratory system: Clear to auscultation. Respiratory effort normal. Cardiovascular system: S1 & S2 heard, RRR. No JVD, murmurs, rubs, gallops or  clicks.  2+ bilateral leg edema. Gastrointestinal system: Abdomen is nondistended, soft and nontender. No organomegaly or masses felt. Normal bowel sounds heard. Central nervous system: Alert and oriented. No focal neurological deficits. Extremities: Symmetric 5 x 5 power.  Left transmetatarsal amputation, has 2+ edema.  Right BKA, has 1+ pedal edema. Skin: No rashes, lesions or ulcers Psychiatry: Judgement and insight appear normal. Mood & affect appropriate.     Data Reviewed: I have personally reviewed following labs and imaging studies  CBC: Recent Labs  Lab 12/21/18 1424  WBC 5.8  HGB 12.0*  HCT 38.1*  MCV 87.0  PLT 992   Basic Metabolic Panel: Recent Labs  Lab 12/21/18 1424 12/22/18 0423  NA 138 140  K 3.9 3.8  CL 110 110  CO2 18* 17*  GLUCOSE 132* 143*  BUN 36* 35*  CREATININE 2.06* 1.99*  CALCIUM 8.6* 8.7*   GFR: Estimated Creatinine Clearance: 57 mL/min (A) (by C-G formula based on SCr of 1.99 mg/dL (H)). Liver Function Tests: Recent Labs  Lab 12/21/18 1450  AST 17  ALT 16  ALKPHOS 47  BILITOT 0.9  PROT 6.5  ALBUMIN 3.4*   Recent Labs  Lab 12/21/18 1450  LIPASE 27   No results for input(s): AMMONIA in the last 168 hours. Coagulation Profile: No results for input(s): INR, PROTIME in the last 168 hours. Cardiac Enzymes: Recent Labs  Lab 12/21/18 1450 12/22/18 0024 12/22/18 0423  TROPONINI 0.05* 0.06* 0.05*   BNP (last 3 results) No results for input(s): PROBNP in the last 8760 hours. HbA1C: Recent Labs    12/22/18 0024  HGBA1C 7.5*   CBG: Recent Labs  Lab 12/22/18 0026 12/22/18 0821  GLUCAP 143* 120*   Lipid Profile: No results for input(s): CHOL, HDL, LDLCALC, TRIG, CHOLHDL, LDLDIRECT in the last 72 hours. Thyroid Function Tests: No results for input(s): TSH, T4TOTAL, FREET4, T3FREE, THYROIDAB in the last 72 hours. Anemia Panel: No results for input(s): VITAMINB12, FOLATE, FERRITIN, TIBC, IRON, RETICCTPCT in the last 72  hours. Sepsis Labs: No results for input(s): PROCALCITON, LATICACIDVEN in the last 168 hours.  Recent Results (from the past 240 hour(s))  SARS Coronavirus 2 [facility] order, Performed in [place] hospital lab)     Status: None   Collection Time: 12/21/18  8:56 PM  Result Value Ref Range Status   SARS Coronavirus 2 NEGATIVE NEGATIVE Final    Comment: (NOTE) If result is NEGATIVE SARS-CoV-2 target nucleic acids are NOT DETECTED. The SARS-CoV-2 RNA is generally detectable in upper and lower  respiratory specimens during the acute phase of infection. The lowest  concentration of SARS-CoV-2 viral copies this assay can detect is 250  copies / mL. A negative result does not preclude SARS-CoV-2 infection  and should not be used as the sole basis for treatment or other  patient management decisions.  A negative result may occur with  improper specimen collection / handling, submission of specimen other  than nasopharyngeal swab, presence of viral mutation(s) within the  areas targeted by this assay, and inadequate number of viral copies  (<250 copies / mL). A negative result must be combined with clinical  observations,  patient history, and epidemiological information. If result is POSITIVE SARS-CoV-2 target nucleic acids are DETECTED. The SARS-CoV-2 RNA is generally detectable in upper and lower  respiratory specimens dur ing the acute phase of infection.  Positive  results are indicative of active infection with SARS-CoV-2.  Clinical  correlation with patient history and other diagnostic information is  necessary to determine patient infection status.  Positive results do  not rule out bacterial infection or co-infection with other viruses. If result is PRESUMPTIVE POSTIVE SARS-CoV-2 nucleic acids MAY BE PRESENT.   A presumptive positive result was obtained on the submitted specimen  and confirmed on repeat testing.  While 2019 novel coronavirus  (SARS-CoV-2) nucleic acids may be  present in the submitted sample  additional confirmatory testing may be necessary for epidemiological  and / or clinical management purposes  to differentiate between  SARS-CoV-2 and other Sarbecovirus currently known to infect humans.  If clinically indicated additional testing with an alternate test  methodology [phone]) is advised. The SARS-CoV-2 RNA is generally  detectable in upper and lower respiratory sp ecimens during the acute  phase of infection. The expected result is Negative. Fact Sheet for Patients:  StrictlyIdeas.no Fact Sheet for Healthcare Providers: BankingDealers.co.za This test is not yet approved or cleared by the [place] FDA and has been authorized for detection and/or diagnosis of SARS-CoV-2 by FDA under an Emergency Use Authorization (EUA).  This EUA will remain in effect (meaning this test can be used) for the duration of the COVID-19 declaration under Section 564(b)(1) of the Act, 21 U.S.C. section 360bbb-3(b)(1), unless the authorization is terminated or revoked sooner. Performed at [facility], [place] [address], [place], [place] [address]          Radiology Studies: Dg Chest 2 View  Result Date: 12/21/2018 CLINICAL DATA:  Shortness of breath. EXAM: CHEST - 2 VIEW COMPARISON:  Radiograph of September 24, 2018. FINDINGS: Stable cardiomediastinal silhouette. Mild central pulmonary vascular congestion is noted. Increased bibasilar opacities are noted concerning for pulmonary edema with associated pleural effusions. No pneumothorax is noted. Bony thorax is unremarkable. IMPRESSION: Stable cardiomegaly with central pulmonary vascular congestion. Increased bibasilar edema and pleural effusions is noted. Electronically Signed   By: Marijo Conception M.D.   On: 12/21/2018 14:41        Scheduled Meds: . aspirin EC  81 mg Oral Daily  . atorvastatin  80 mg Oral q1800  . DULoxetine  30 mg Oral Daily  .  ezetimibe  10 mg Oral Daily  . furosemide  40 mg Intravenous Q12H  . heparin injection (subcutaneous)  5,000 Units Subcutaneous Q8H  . hydrALAZINE  50 mg Oral Q8H  . insulin aspart  0-9 Units Subcutaneous TID WC  . isosorbide mononitrate  30 mg Oral Daily  . metoprolol succinate  50 mg Oral Daily  . multivitamin with minerals  1 tablet Oral Daily  . ticagrelor  90 mg Oral BID   Continuous Infusions:   LOS: 0 days    Time spent: 25 minutes.    Barb Merino, MD [facility] Pager [phone]  If 7PM-7AM, please contact night-coverage [email or web address] Password [email or web address] 12/22/2018, 11:41 AM

## 2018-12-22 NOTE — Progress Notes (Signed)
Nutrition Brief Note RD working remotely.  Patient identified on the Malnutrition Screening Tool (MST) Report  Wt Readings from Last 15 Encounters:  12/22/18 104.5 kg  11/03/18 103 kg  10/29/18 105.5 kg  10/22/18 105.5 kg  10/12/18 105.5 kg  09/24/18 97.5 kg  09/11/18 99.8 kg  09/03/18 99.8 kg  08/26/18 99.8 kg  08/20/18 97.5 kg  08/13/18 98.9 kg  07/31/18 96.5 kg  07/23/18 98.4 kg  07/15/18 98.4 kg  07/09/18 99.8 kg   Edward [name] is a 48 y.o. male with medical history significant of chronic combined systolic and diastolic congestive heart failure, CAD status post PCI, type 2 diabetes, hypertension, and conditions listed below presenting to the hospital for evaluation of shortness of breath. Patient reports 2-day history of shortness of breath, orthopnea, and lower extremity edema.  Does endorse increased dietary fluid and salt intake.  States he took 3 tablets of Lasix 40 mg yesterday and 2 tablets today.  Reports noncompliance with home blood pressure medications.  Denies any fevers, chills, chest pain, sick contacts, recent travel, or exposure to any individual with confirmed or suspected COVID-19.  Pt admitted with CHF exacerbation.  Reviewed I/O's: -1.7 L x 24 hours  UOP: 2 L x 24 hours  Pt with good appetite. Noted meal completion 75-100%. Per H&P, he reports weight gain related to diet indiscretion. He has received education from clinical RDs multiple times during prior hospitalizations.  Wt likely related to edema.  Lab Results  Component Value Date   HGBA1C 7.5 (H) 12/22/2018   PTA DM medications are 50-1000 mg sitagliptin-metformin daily and 12 units insulin aspart protamine aspart BID.   Labs reviewed: CBGS: [id number] (inpatient orders for glycemic control are 0-9 units insulin aspart TID with meals and 15 units insulin aspart proatmine-aspart BID).   BMI: 32.5 (adjusted for rt BKA). Patient meets criteria for obesity, class II based on current BMI.   Current  diet order is Heart Healthy, patient is consuming approximately 75-100%% of meals at this time. Labs and medications reviewed.   No nutrition interventions warranted at this time. If nutrition issues arise, please consult RD.   Nioka Thorington A. Jimmye Norman, RD, LDN, [place] Registered Dietitian II Certified Diabetes Care and Education Specialist Pager: [phone] After hours Pager: [phone]

## 2018-12-23 DIAGNOSIS — I509 Heart failure, unspecified: Secondary | ICD-10-CM

## 2018-12-23 DIAGNOSIS — I5023 Acute on chronic systolic (congestive) heart failure: Secondary | ICD-10-CM

## 2018-12-23 DIAGNOSIS — R7989 Other specified abnormal findings of blood chemistry: Secondary | ICD-10-CM

## 2018-12-23 LAB — GLUCOSE, CAPILLARY
Glucose-Capillary: 108 mg/dL — ABNORMAL HIGH (ref 70–99)
Glucose-Capillary: 75 mg/dL (ref 70–99)
Glucose-Capillary: 169 mg/dL — ABNORMAL HIGH (ref 70–99)
Glucose-Capillary: 94 mg/dL (ref 70–99)

## 2018-12-23 LAB — CBC WITH DIFFERENTIAL/PLATELET
Abs Immature Granulocytes: 0.03 10*3/uL (ref 0.00–0.07)
Basophils Absolute: 0 10*3/uL (ref 0.0–0.1)
Basophils Relative: 1 %
Eosinophils Absolute: 0.3 10*3/uL (ref 0.0–0.5)
Eosinophils Relative: 4 %
HCT: 38.9 % — ABNORMAL LOW (ref 39.0–52.0)
Hemoglobin: 12.8 g/dL — ABNORMAL LOW (ref 13.0–17.0)
Immature Granulocytes: 1 %
Lymphocytes Relative: 33 %
Lymphs Abs: 1.9 10*3/uL (ref 0.7–4.0)
MCH: 27.3 pg (ref 26.0–34.0)
MCHC: 32.9 g/dL (ref 30.0–36.0)
MCV: 82.9 fL (ref 80.0–100.0)
Monocytes Absolute: 0.5 10*3/uL (ref 0.1–1.0)
Monocytes Relative: 9 %
Neutro Abs: 3 10*3/uL (ref 1.7–7.7)
Neutrophils Relative %: 52 %
Platelets: 248 10*3/uL (ref 150–400)
RBC: 4.69 MIL/uL (ref 4.22–5.81)
RDW: 13.6 % (ref 11.5–15.5)
WBC: 5.7 10*3/uL (ref 4.0–10.5)
nRBC: 0 % (ref 0.0–0.2)

## 2018-12-23 LAB — BASIC METABOLIC PANEL
Anion gap: 10 (ref 5–15)
BUN: 37 mg/dL — ABNORMAL HIGH (ref 6–20)
CO2: 18 mmol/L — ABNORMAL LOW (ref 22–32)
Calcium: 8.5 mg/dL — ABNORMAL LOW (ref 8.9–10.3)
Chloride: 109 mmol/L (ref 98–111)
Creatinine, Ser: 2.22 mg/dL — ABNORMAL HIGH (ref 0.61–1.24)
GFR calc Af Amer: 39 mL/min — ABNORMAL LOW (ref >60)
GFR calc non Af Amer: 34 mL/min — ABNORMAL LOW (ref >60)
Glucose, Bld: 103 mg/dL — ABNORMAL HIGH (ref 70–99)
Potassium: 3.7 mmol/L (ref 3.5–5.1)
Sodium: 137 mmol/L (ref 135–145)

## 2018-12-23 MED ORDER — FUROSEMIDE 40 MG PO TABS
40.0000 mg | ORAL_TABLET | Freq: Every day | ORAL | Status: DC
  Administered 2018-12-24: 40 mg via ORAL
  Filled 2018-12-23: qty 1

## 2018-12-23 NOTE — Consult Note (Signed)
Cardiology Consult    Patient ID: Edward [name] MRN: [id number], DOB/AGE: 1971/06/30   Admit date: 12/21/2018 Date of Consult: 12/23/2018  Primary Physician: [name], MD Primary Cardiologist: Kirk Ruths, MD Requesting Provider: Estill Cotta, MD  Patient Profile    Edward [name] is a 48 y.o. male with a history of CAD s/p PCI with DES to left circumflex with residual 90% stenosis of mid to distal LAD (distal LAD small vessel and not amenable to PCI) and 75% first marginal, chronic combined CHF with EF of 40-45%, hypertension, diabetes mellitus, and right below-knee amputation in 06/2018 and left transmetatarsal amputation in 2017 (dopplers in 01/2017 showed no evidence of obstructive disease with relatively normal ABIs bilaterally) who is being seen today for the evaluation of CHF at the request of Dr. Tana Coast.  History of Present Illness    Edward [name] is a 48 year old male with the above history who is followed by Dr. Stanford Breed. Patient was admitted in 07/2018 for CHF and NSTEMI. Echocardiogram showed ejection fraction 40 to 45%, moderate left ventricular hypertrophy, mild aortic insufficiency, mild to moderate mitral regurgitation, mild left atrial enlargement and small pericardial effusion. Cardiac catheterization revealed an 85% circumflex, 90% mid to distal LAD (distal LAD small vessel and not amenable to PCI) and 75% first marginal.  Patient underwent successful PCI with DES to the left circumflex. Patient last saw Dr. Stanford Breed on 10/12/2018 for follow-up at which time he reported occasional mild dyspnea but no chest pain, palpitations, or syncope.  Patient presented to the ED on 12/21/2018 for evaluation of shortness of breath and lower extremity swelling. Patient reports worsening shortness of breath primarily at rest over the last 3-5 days with orthopnea, PND, and edema of left lower extremity (s/p right below-knee amputation) and abdomen. Patient thinks that he has probably gained a  little weight but is not sure. He thinks his dry weight is around 227 lbs. Patient states he was eating fast food last weekend and thinks that may have triggered it. He usually takes Lasix 40mg  daily but take he took 3 pills on Sunday (5/3) and 2 pills on Monday (5/4) prior to coming to the ED. He states he is compliant with all his medications; however, per the H&P, patient sometimes non-compliant with BP medications. Upon further questions, patient admits that he often forgets to take the third dose of Hydralazine.  Patient reports occasional palpitations but denies any chest pain, lightheadedness, dizziness, or syncope. No fevers, chills, or body aches. He reports some nasal congestion due to seasonal allergies but denies any cough or known exposure to the coronavirus.   In the ED, patient hypertensive with BP as high as the 180's/100's. EKG reportedly showed no acute changes from prior tracings. Initial troponin minimally elevated at 0.05. BNP elevated at 631.7. Chest x-ray showed stable cardiomegaly with central pulmonary vascular congestion and increased bibasilar edema and pleural effusions. WBC 5.8, Hgb 12.0, Plts 230. Na 138, K 3.9, Glucose 132, SCr 2.06. COVID-19 testing negative. Patient was admitted for IV diuresis. Serum creatinine has increased with diuresis so Cardiology was consulted for assistance.  At the time of this evaluation, patient states he is breathing much better after receiving IV Lasix and states he was able to sleep better last night.  Patient continues to smoke but states he is trying to quit. He is down to 1 pack per day every 2-3 days. He denies any alcohol and drug use. Patient reports his mother has a history of  heart disease but was unable to specify (thinks he may be something with a heart valve). No other known family history of heart disease.   Past Medical History   Past Medical History:  Diagnosis Date   Anxiety    self reported   Arthritis    CHF  (congestive heart failure) ([place])    Coronary artery disease    Depression    self reported   Diabetes mellitus    Hypertension     Past Surgical History:  Procedure Laterality Date   AMPUTATION Left 05/10/2016   Procedure: Left Transmetatarsal Amputation;  Surgeon: [name], MD;  Location: [facility];  Service: Orthopedics;  Laterality: Left;   AMPUTATION Right 05/16/2018   Procedure: PARTIAL RAY AMPUTATION FIFTH TOE RIGHT FOOT;  Surgeon: [name], DPM;  Location: [facility];  Service: Podiatry;  Laterality: Right;   AMPUTATION Right 06/17/2018   Procedure: AMPUTATION 4th RAY Right Foot;  Surgeon: Trula Slade, DPM;  Location: [facility];  Service: Podiatry;  Laterality: Right;   AMPUTATION Right 06/19/2018   Procedure: RIGHT BELOW KNEE AMPUTATION;  Surgeon: [name], MD;  Location: [facility];  Service: Orthopedics;  Laterality: Right;   APPLICATION OF WOUND VAC Right 06/19/2018   Procedure: APPLICATION OF WOUND VAC;  Surgeon: [name], MD;  Location: [facility];  Service: Orthopedics;  Laterality: Right;   CARDIAC CATHETERIZATION  07/29/2018   CIRCUMCISION  09/02/2011   Procedure: CIRCUMCISION ADULT;  Surgeon: Molli Hazard, MD;  Location: [facility];  Service: Urology;  Laterality: N/A;   CORONARY STENT INTERVENTION N/A 07/29/2018   Procedure: CORONARY STENT INTERVENTION;  Surgeon: Leonie Man, MD;  Location: [facility];  Service: Cardiovascular;  Laterality: N/A;   I&D EXTREMITY  09/02/2011   Procedure: IRRIGATION AND DEBRIDEMENT EXTREMITY;  Surgeon: Theodoro Kos, DO;  Location: [facility];  Service: Plastics;  Laterality: N/A;   INCISION AND DRAINAGE OF WOUND  08/12/2011   Procedure: IRRIGATION AND DEBRIDEMENT WOUND;  Surgeon: Zenovia Jarred, MD;  Location: [facility];  Service: General;;  perineum   IRRIGATION AND DEBRIDEMENT ABSCESS  08/12/2011   Procedure: IRRIGATION AND DEBRIDEMENT ABSCESS;  Surgeon: Molli Hazard, MD;  Location: [facility];  Service: Urology;  Laterality: N/A;  irrigation and debridment perineum     IRRIGATION AND DEBRIDEMENT ABSCESS  08/14/2011   Procedure: MINOR INCISION AND DRAINAGE OF ABSCESS;  Surgeon: Molli Hazard, MD;  Location: [facility];  Service: Urology;  Laterality: N/A;  Perineal Wound Debridement;Placement Bilateral Testicular Thigh Pouches   LEFT HEART CATH AND CORONARY ANGIOGRAPHY N/A 07/29/2018   Procedure: LEFT HEART CATH AND CORONARY ANGIOGRAPHY;  Surgeon: Leonie Man, MD;  Location: [facility];  Service: Cardiovascular;  Laterality: N/A;   TOTAL HIP ARTHROPLASTY Left 10/11/2016   Procedure: LEFT TOTAL HIP ARTHROPLASTY ANTERIOR APPROACH;  Surgeon: Mcarthur Rossetti, MD;  Location: [facility];  Service: Orthopedics;  Laterality: Left;     Allergies  No Known Allergies  Inpatient Medications     aspirin EC  81 mg Oral Daily   atorvastatin  80 mg Oral q1800   DULoxetine  30 mg Oral Daily   ezetimibe  10 mg Oral Daily   furosemide  40 mg Intravenous Q12H   heparin injection (subcutaneous)  5,000 Units Subcutaneous Q8H   hydrALAZINE  50 mg Oral Q8H   insulin aspart  0-9 Units Subcutaneous TID WC   insulin aspart protamine-  aspart  15 Units Subcutaneous BID WC   isosorbide mononitrate  30 mg Oral Daily   metoprolol succinate  50 mg Oral Daily   multivitamin with minerals  1 tablet Oral Daily   ticagrelor  90 mg Oral BID    Family History    Family History  Problem Relation Age of Onset   Diabetes Other    He indicated that the status of his other is unknown.   Social History    Social History   Socioeconomic History   Marital status: Widowed    Spouse name: Not on file   Number of children: Not on file   Years of education: Not on file   Highest education level: Not on file  Occupational History   Occupation: Disabled  Social Designer, fashion/clothing strain: Not on file   Food insecurity:     Worry: Not on file    Inability: Not on file   Transportation needs:    Medical: Not on file    Non-medical: Not on file  Tobacco Use   Smoking status: Current Every Day Smoker    Types: Cigarettes   Smokeless tobacco: Never Used  Substance and Sexual Activity   Alcohol use: Yes    Alcohol/week: 0.0 standard drinks    Comment: has not had alcohol in a month   Drug use: No   Sexual activity: Not on file  Lifestyle   Physical activity:    Days per week: Not on file    Minutes per session: Not on file   Stress: Not on file  Relationships   Social connections:    Talks on phone: Not on file    Gets together: Not on file    Attends religious service: Not on file    Active member of club or organization: Not on file    Attends meetings of clubs or organizations: Not on file    Relationship status: Not on file   Intimate partner violence:    Fear of current or ex partner: Not on file    Emotionally abused: Not on file    Physically abused: Not on file    Forced sexual activity: Not on file  Other Topics Concern   Not on file  Social History Narrative   Lives w/ his 2 sons.     Review of Systems    Review of Systems  Constitutional: Negative for chills, fever and weight loss.  HENT: Positive for congestion. Negative for sore throat.   Respiratory: Positive for shortness of breath. Negative for cough and hemoptysis.   Cardiovascular: Positive for palpitations, orthopnea, leg swelling and PND. Negative for chest pain.  Gastrointestinal: Negative for blood in stool.  Genitourinary: Negative for dysuria.  Musculoskeletal: Positive for joint pain. Negative for myalgias.  Neurological: Negative for loss of consciousness.  Endo/Heme/Allergies: Positive for environmental allergies. Does not bruise/bleed easily.  Psychiatric/Behavioral: Positive for substance abuse (tobacco use).    Physical Exam    Physical Exam per MD:  Blood pressure (!) 144/90, pulse 84,  temperature 97.6 F (36.4 C), temperature source Oral, resp. rate 20, height 6\' 1"  (1.854 m), weight 107.9 kg, SpO2 100 %.  General: 48 y.o. male resting comfortably in no acute distress. Pleasant and cooperative. HEENT: Normal  Neck: Supple. No carotid bruits or JVD appreciated. Lungs: No increased work of breathing. Clear to auscultation bilaterally. No wheezes, rhonchi, or rales. Heart: RRR. Distinct S1 and S2. No murmurs, gallops, or rubs.  Abdomen: Soft,  non-distended, and non-tender to palpation. Bowel sounds present in all 4 quadrants.   Extremities: No clubbing, cyanosis or edema. Radial, posterior tibial, and distal pedal pulses 2+ and equal bilaterally. Skin: Warm and dry. Neuro: Alert and oriented x3. No focal deficits. Moves all extremities spontaneously. Psych: Normal affect.  Labs    Troponin (Point of Care Test) No results for input(s): TROPIPOC in the last 72 hours. Recent Labs    12/21/18 1450 12/22/18 0024 12/22/18 0423  TROPONINI 0.05* 0.06* 0.05*   Lab Results  Component Value Date   WBC 5.7 12/23/2018   HGB 12.8 (L) 12/23/2018   HCT 38.9 (L) 12/23/2018   MCV 82.9 12/23/2018   PLT 248 12/23/2018    Recent Labs  Lab 12/21/18 1450  12/23/18 0501  NA  --    < > 137  K  --    < > 3.7  CL  --    < > 109  CO2  --    < > 18*  BUN  --    < > 37*  CREATININE  --    < > 2.22*  CALCIUM  --    < > 8.5*  PROT 6.5  --   --   BILITOT 0.9  --   --   ALKPHOS 47  --   --   ALT 16  --   --   AST 17  --   --   GLUCOSE  --    < > 103*   < > = values in this interval not displayed.   Lab Results  Component Value Date   CHOL 194 10/16/2018   HDL 44 10/16/2018   LDLCALC 134 (H) 10/16/2018   TRIG 82 10/16/2018   No results found for: [facility]   Radiology Studies    Dg Chest 2 View  Result Date: 12/21/2018 CLINICAL DATA:  Shortness of breath. EXAM: CHEST - 2 VIEW COMPARISON:  Radiograph of September 24, 2018. FINDINGS: Stable cardiomediastinal silhouette. Mild  central pulmonary vascular congestion is noted. Increased bibasilar opacities are noted concerning for pulmonary edema with associated pleural effusions. No pneumothorax is noted. Bony thorax is unremarkable. IMPRESSION: Stable cardiomegaly with central pulmonary vascular congestion. Increased bibasilar edema and pleural effusions is noted. Electronically Signed   By: Marijo Conception M.D.   On: 12/21/2018 14:41    EKG     EKG: EKG was personally reviewed and demonstrates:  NSR ,  Previous amt/ <O  Telemetry: Telemetry was personally reviewed and demonstrates: NSR   Cardiac Imaging    Left Heart Catheterization 07/29/2018:  Prox Cx to Mid Cx lesion is 85% stenosed.  A drug-eluting stent was successfully placed using a STENT SYNERGY DES 2.5X32. Postdilated and tapered fashion from 2.8 mm proximal and up to 3.1 mm in the distal two thirds of the stent.  Post intervention, there is a 0% residual stenosis.  LV end diastolic pressure is severely elevated. 25 mmHg  Mid LAD to Dist LAD lesion is 90% stenosed. Distal LAD small vessel. Not PCI target.  Lat 1st Mrg lesion is 75% stenosed.   SUMMARY  Severe two-vessel disease ? Culprit lesion likely being the mid circumflex treated with a DES stent (Synergy DES 2.5 mm x 32 mm -postdilated to 3.1 mm),  ? There is also diffuse extensive disease in the distal/apical portion of the LAD that is not PCI minimal.    Several other very small branch vessels-diagonal branches, lateral OM's etc. have significant disease, but  are too small for PTCA.    Otherwise ectatic RCA and proximal to mid LAD.  Severely elevated LVEDP of 25 mmHg (in the setting of reduced EF) suggestive of ACUTE COMBINED SYSTOLIC AND DIASTOLIC HEART FAILURE  RECOMMENDATIONS  Return to 2 Central Progressive Care unit for ongoing care and TR band removal per protocol  We will dose Lasix 40 mg IV x3 starting this evening for evidence of acute combined systolic and diastolic heart  failure.  Continue other aggressive risk factor modification and cardiomyopathy treatment.  Medical management for LAD and other small vessel disease including nitrate, beta-blocker etc.  Would monitor for least 1 more day following PCI to ensure stable renal function given contrast load with a creatinine of 1.8. _______________  Echocardiogram 12/22/2018:  1. The left ventricle has mildly reduced systolic function, with an ejection fraction of 45-50%. The cavity size was mildly dilated. There is mildly increased left ventricular wall thickness. Left ventricular diastolic Doppler parameters are consistent  with pseudonormalization. Elevated mean left atrial pressure.  2. The right ventricle has normal systolic function. The cavity was normal.  3. Left atrial size was severely dilated.  4. Trivial pericardial effusion is present.  5. The mitral valve is grossly normal. Mitral valve regurgitation is moderate by color flow Doppler.  6. The tricuspid valve is grossly normal.  7. The aortic valve is tricuspid. Mild thickening of the aortic valve. Aortic valve regurgitation is mild by color flow Doppler. No stenosis of the aortic valve.  8. Hyokinesis of the inferolateral wall and apex; overall mildly reduced LV systolic function; mild LVH and LVE; moderate diastolic dysfunction; mild AI; moderate MR; severe LAE.  Assessment & Plan    Acute on Chronic Combined CHF - Patient presented with worsening shortness of breath, orthopnea, PND, and edema. Likely secondary to dietary and medication non-compliance. - Chest x-ray showed stable cardiomegaly with central pulmonary vascular congestion and increased bibasilar edema and pleural effusions. - BNP elevated around 630. - Echo this admission showed LVEF of 45-50% and moderate diastolic dysfunction with mild LV hypertrophy and enlargement, hypokinesis of the inferolateral wall and apex, and elevated mean left atrial pressure. - Patient received IV Lasix  60mg  in the ED and then was started on 40mg  twice daily with good response. Net negative 3.8 L since admission. However, renal function worsening. Serum creatinine 2.22 today up from 1.99 yesterday. - Will defer additional diuresis to MD who will assess volume status. - Home Losartan as been held due to renal function. - Continue home Toprol-XL 50mg  daily, Imdur 30mg  daily, and Hydralazine 50mg  three times daily. - Continue to monitor daily weights, strict I/O's, and renal function.  Elevated Troponin  - Troponin minimally elevated and flat at 0.05 >> 0.06 >> 0.05. Not consistent with ACS. - Patient denies any chest pain. - Suspect demand ischemia in the setting of acute CHF. Do not anticipate any additional ischemic work-up.  CAD  - Patient s/p PCI with DES to the left circumflex in 07/2018 with residual mid to distal LAD disease not amenable to PCI. - Continue dual antiplatelet therapy with Aspirin and Brilinta.  - Continue beta-blocker and high-intensity statin/Zetia.  Hypertension - BP elevated at 144/90. Patient admitted to some non-compliance with his BP medications. - Home Losartan has been held due to renal function. - Continue home Hydralazine and Toprol as above. Could consider increasing Hydralazine to 100mg  three time daily (however patient has trouble being compliant with this) or switching Toprol to Coreg for additional  BP control.  Hyperlipidemia - Continue home Lipitor and Zetia.  Type 2 Diabetes Mellitus - Management per primary team.  Acute on Chronic Kidney Disease Stage III - Serum creatinine elevated at 2.06 >> 1.99 >> 2.22 since admission. Baseline appears to be around 1.5 to 2.0 over the last several months. - Continue to avoid nephrotoxic agents. Hold home Losartan. - Will defer diuresis management to MD.  Otherwise, per primary team.   Signed, Darreld Mclean, PA-C 12/23/2018, 1:08 PM Pager: [phone] For questions or updates, please contact     Please consult [email or web address] for contact info under Cardiology/STEMI.  Attending Note:   The patient was seen and examined.  Agree with assessment and plan as noted above.  Changes made to the above note as needed.  Patient seen and independently examined with  Sande Rives, PA .   We discussed all aspects of the encounter. I agree with the assessment and plan as stated above.  1.  Acute on chronic combined systolic and diastolic congestive heart failure: The patient admits to being noncompliant with his diet.  He is eating more salty foods than he should.  His ejection fraction is 45 to 50%.  He also has moderate diastolic dysfunction.  He is received IV Lasix and he essentially is euvolemic at this time.  He is diuresed 3.8 L.  His creatinine bumped a little bit today.  At this point I think we can put him back on his home dose of diuretics.  His home dose of losartan has been held due to his worsening renal function  But I  suspect that we can restart that fairly soon-hopefully tomorrow.  2.  Elevated troponin: His troponin trend is flat which is consistent with demand ischemia from his congestive heart failure  3.  Current coronary artery disease: He status post stenting in December, 2019.  I have explained the importance of taking Brilinta twice a day every day as well as aspirin 81 mg a day  4.  Chronic kidney disease: His baseline creatinine appears to be around 2.  We will follow-up with his primary medical doctor.   I have spent a total of 40 minutes with patient reviewing hospital  notes , telemetry, EKGs, labs and examining patient as well as establishing an assessment and plan that was discussed with the patient. > 50% of time was spent in direct patient care.    Thayer Headings, Brooke Bonito., MD, [facility] 12/23/2018, 3:19 PM [address]  [place] Pager [phone]

## 2018-12-23 NOTE — Progress Notes (Signed)
Per CCMD patient had 5 beats run of v-tach with PVCs at 0910.  EKG monitor strip was printed and placed on patients chart.  Pt is asymptomatic. currently sitting down in bed watching television. Rai MD paged.   12/23/18 0921  Vitals  Temp 98 F (36.7 C)  Temp Source Oral  BP (!) 141/85  MAP (mmHg) 103  BP Location Left Arm  BP Method Automatic  Patient Position (if appropriate) Sitting  Pulse Rate 68  Pulse Rate Source Monitor  Resp 18  Oxygen Therapy  SpO2 97 %  O2 Device Room Air  MEWS Score  MEWS RR 0  MEWS Pulse 0  MEWS Systolic 0  MEWS LOC 0  MEWS Temp 0  MEWS Score 0  MEWS Score Color Green

## 2018-12-23 NOTE — Plan of Care (Signed)
  Problem: Clinical Measurements: Goal: Will remain free from infection Outcome: Progressing Goal: Respiratory complications will improve Outcome: Progressing   Problem: Elimination: Goal: Will not experience complications related to bowel motility Outcome: Progressing   Problem: Safety: Goal: Ability to remain free from injury will improve Outcome: Progressing   Problem: Activity: Goal: Capacity to carry out activities will improve Outcome: Progressing

## 2018-12-23 NOTE — Progress Notes (Signed)
[facility]                                                                              Patient Demographics  Edward [name], is a 48 y.o. male, DOB - 1971-06-19, WUJ:[id number]  Admit date - 12/21/2018   Admitting Physician Shela Leff, MD  Outpatient Primary MD for the patient is [name], MD  Outpatient specialists:   LOS - 1  days   Medical records reviewed and are as summarized below:    Chief Complaint  Patient presents with  . Congestive Heart Failure  . Shortness of Breath       Brief summary   Edward Ignasiak Bowersis a 48 y.o.malewith medical history significant ofchronic combined systolic and diastolic congestive heart failure, CAD status post recent PCI, type 2 diabetes, hypertension presenting to the hospital for evaluation of shortness of breath.Patient reported 2-days history of shortness of breath, orthopnea, and lower extremity edema. Does endorse increased dietary fluid and salt intake. States he took 3 tablets of Lasix 40 mg yesterday and 2 tablets today. Reports noncompliance with home blood pressure medications.  Denied any fevers, chills, chest pain, sick contacts, recent travel, or exposure to any individual with confirmed or suspected COVID-19.    Patient was admitted for CHF exacerbation.     Assessment & Plan    Principal Problem:   Acute exacerbation of chronic combined systolic and diastolic CHF (congestive heart failure) (HCC) -BNP 631, presented with dyspnea, orthopnea and lower extremity edema.  BKA right leg, left LE with 1-2+ pitting edema -2D echo showed EF 45 to 50% -Patient was placed on IV Lasix 40 mg twice daily, negative balance of 3.4 L - at the time of admission weight 230lbs, today 237.8 -Creatinine trended up to 2.2 from 1.9 yesterday. Baseline Cr 1.6-1.8 -Patient feels a symptomatically better with the dyspnea however still has significant LLextremity edema, although improving.  Cardiology consulted for  recommendations regarding diuresis given creatinine trending up.  Active Problems: Acute kidney injury on CKD stage III Baseline creatinine 1.6-1.8 Creatinine trended up to 2.2 today with diuresis Will await cardiology recommendations regarding Lasix  Mild troponin elevation -In the setting of CAD and drug-eluting stent.  No chest pain, probably due to CHF exacerbation. -Continue aspirin, Brilinta, beta-blocker, statin. -Continue hydralazine and Imdur.  Essential hypertension -BP was elevated at the time of presentation, now better, continue beta-blocker, hydralazine, Imdur  Type 2 diabetes mellitus, uncontrolled with CKD -Hemoglobin A1c 7.5, continue sliding scale insulin, and home regimen of insulin   Code Status: *Full code DVT Prophylaxis: Heparin subcu Family Communication: Discussed in detail with the patient, all imaging results, lab results explained to the patient r *   Disposition Plan: Patient still has significant peripheral edema, CHF exacerbation, on IV diuretics and creatinine has started trending up.  High likelihood of decompensation without hospitalization, cardiology evaluation awaited.  Time Spent in minutes 35 minutes  Procedures:  2D echo  Consultants:   Cardiology  Antimicrobials:   Anti-infectives (From admission, onward)   None          Medications  Scheduled Meds: . aspirin EC  81 mg  Oral Daily  . atorvastatin  80 mg Oral q1800  . DULoxetine  30 mg Oral Daily  . ezetimibe  10 mg Oral Daily  . furosemide  40 mg Intravenous Q12H  . heparin injection (subcutaneous)  5,000 Units Subcutaneous Q8H  . hydrALAZINE  50 mg Oral Q8H  . insulin aspart  0-9 Units Subcutaneous TID WC  . insulin aspart protamine- aspart  15 Units Subcutaneous BID WC  . isosorbide mononitrate  30 mg Oral Daily  . metoprolol succinate  50 mg Oral Daily  . multivitamin with minerals  1 tablet Oral Daily  . ticagrelor  90 mg Oral BID   Continuous Infusions:  PRN Meds:.acetaminophen, ALPRAZolam, docusate sodium, hydrALAZINE, methocarbamol      Subjective:   Edward [name] was seen and examined today.  No chest pain.  Shortness of breath is improving however still left lower extremity edema.  Patient denies dizziness, chest pain,  abdominal pain, N/V/D/C, new weakness, numbess, tingling. No acute events overnight.    Objective:   Vitals:   12/23/18 0225 12/23/18 0424 12/23/18 0921 12/23/18 0921  BP:  (!) 151/90 (!) 141/85 (!) 141/85  Pulse:  81 68 68  Resp:  16  18  Temp:  97.9 F (36.6 C)  98 F (36.7 C)  TempSrc:  Oral  Oral  SpO2:  97%  97%  Weight: 107.9 kg     Height:        Intake/Output Summary (Last 24 hours) at 12/23/2018 1035 Last data filed at 12/23/2018 0900 Gross per 24 hour  Intake 440 ml  Output 1625 ml  Net -1185 ml     Wt Readings from Last 3 Encounters:  12/23/18 107.9 kg  11/03/18 103 kg  10/29/18 105.5 kg     Exam  General: Alert and oriented x 3, NAD  Eyes:   HEENT:  Atraumatic, normocephalic, normal oropharynx  Cardiovascular: S1 S2 auscultated, no rubs, murmurs or gallops. Regular rate and rhythm.  Respiratory: Clear to auscultation bilaterally, no wheezing, rales or rhonchi  Gastrointestinal: Soft, nontender, nondistended, + bowel sounds  Ext: Right BKA, left LE 1-2+ pitting edema   Neuro: No new deficits  Musculoskeletal: No digital cyanosis, clubbing  Skin: No rashes  Psych: Normal affect and demeanor, alert and oriented x3    Data Reviewed:  I have personally reviewed following labs and imaging studies  Micro Results Recent Results (from the past 240 hour(s))  SARS Coronavirus 2 [facility] order, Performed in [place] hospital lab)     Status: None   Collection Time: 12/21/18  8:56 PM  Result Value Ref Range Status   SARS Coronavirus 2 NEGATIVE NEGATIVE Final    Comment: (NOTE) If result is NEGATIVE SARS-CoV-2 target nucleic acids are NOT DETECTED. The SARS-CoV-2 RNA is  generally detectable in upper and lower  respiratory specimens during the acute phase of infection. The lowest  concentration of SARS-CoV-2 viral copies this assay can detect is 250  copies / mL. A negative result does not preclude SARS-CoV-2 infection  and should not be used as the sole basis for treatment or other  patient management decisions.  A negative result may occur with  improper specimen collection / handling, submission of specimen other  than nasopharyngeal swab, presence of viral mutation(s) within the  areas targeted by this assay, and inadequate number of viral copies  (<250 copies / mL). A negative result must be combined with clinical  observations, patient history, and epidemiological information. If result is POSITIVE  SARS-CoV-2 target nucleic acids are DETECTED. The SARS-CoV-2 RNA is generally detectable in upper and lower  respiratory specimens dur ing the acute phase of infection.  Positive  results are indicative of active infection with SARS-CoV-2.  Clinical  correlation with patient history and other diagnostic information is  necessary to determine patient infection status.  Positive results do  not rule out bacterial infection or co-infection with other viruses. If result is PRESUMPTIVE POSTIVE SARS-CoV-2 nucleic acids MAY BE PRESENT.   A presumptive positive result was obtained on the submitted specimen  and confirmed on repeat testing.  While 2019 novel coronavirus  (SARS-CoV-2) nucleic acids may be present in the submitted sample  additional confirmatory testing may be necessary for epidemiological  and / or clinical management purposes  to differentiate between  SARS-CoV-2 and other Sarbecovirus currently known to infect humans.  If clinically indicated additional testing with an alternate test  methodology [phone]) is advised. The SARS-CoV-2 RNA is generally  detectable in upper and lower respiratory sp ecimens during the acute  phase of infection.  The expected result is Negative. Fact Sheet for Patients:  StrictlyIdeas.no Fact Sheet for Healthcare Providers: BankingDealers.co.za This test is not yet approved or cleared by the [place] FDA and has been authorized for detection and/or diagnosis of SARS-CoV-2 by FDA under an Emergency Use Authorization (EUA).  This EUA will remain in effect (meaning this test can be used) for the duration of the COVID-19 declaration under Section 564(b)(1) of the Act, 21 U.S.C. section 360bbb-3(b)(1), unless the authorization is terminated or revoked sooner. Performed at [facility], [place] [address] [place], [place] [address]     Radiology Reports Dg Chest 2 View  Result Date: 12/21/2018 CLINICAL DATA:  Shortness of breath. EXAM: CHEST - 2 VIEW COMPARISON:  Radiograph of September 24, 2018. FINDINGS: Stable cardiomediastinal silhouette. Mild central pulmonary vascular congestion is noted. Increased bibasilar opacities are noted concerning for pulmonary edema with associated pleural effusions. No pneumothorax is noted. Bony thorax is unremarkable. IMPRESSION: Stable cardiomegaly with central pulmonary vascular congestion. Increased bibasilar edema and pleural effusions is noted. Electronically Signed   By: Marijo Conception M.D.   On: 12/21/2018 14:41    Lab Data:  CBC: Recent Labs  Lab 12/21/18 1424 12/23/18 0501  WBC 5.8 5.7  NEUTROABS  --  3.0  HGB 12.0* 12.8*  HCT 38.1* 38.9*  MCV 87.0 82.9  PLT 230 737   Basic Metabolic Panel: Recent Labs  Lab 12/21/18 1424 12/22/18 0423 12/23/18 0501  NA 138 140 137  K 3.9 3.8 3.7  CL 110 110 109  CO2 18* 17* 18*  GLUCOSE 132* 143* 103*  BUN 36* 35* 37*  CREATININE 2.06* 1.99* 2.22*  CALCIUM 8.6* 8.7* 8.5*   GFR: Estimated Creatinine Clearance: 51.9 mL/min (A) (by C-G formula based on SCr of 2.22 mg/dL (H)). Liver Function Tests: Recent Labs  Lab 12/21/18 1450  AST 17  ALT 16   ALKPHOS 47  BILITOT 0.9  PROT 6.5  ALBUMIN 3.4*   Recent Labs  Lab 12/21/18 1450  LIPASE 27   No results for input(s): AMMONIA in the last 168 hours. Coagulation Profile: No results for input(s): INR, PROTIME in the last 168 hours. Cardiac Enzymes: Recent Labs  Lab 12/21/18 1450 12/22/18 0024 12/22/18 0423  TROPONINI 0.05* 0.06* 0.05*   BNP (last 3 results) No results for input(s): PROBNP in the last 8760 hours. HbA1C: Recent Labs    12/22/18 0024  HGBA1C 7.5*  CBG: Recent Labs  Lab 12/22/18 0821 12/22/18 1139 12/22/18 1641 12/22/18 2104 12/23/18 0603  GLUCAP 120* 208* 130* 93 108*   Lipid Profile: No results for input(s): CHOL, HDL, LDLCALC, TRIG, CHOLHDL, LDLDIRECT in the last 72 hours. Thyroid Function Tests: No results for input(s): TSH, T4TOTAL, FREET4, T3FREE, THYROIDAB in the last 72 hours. Anemia Panel: No results for input(s): VITAMINB12, FOLATE, FERRITIN, TIBC, IRON, RETICCTPCT in the last 72 hours. Urine analysis:    Component Value Date/Time   COLORURINE AMBER (A) 06/16/2018 1820   APPEARANCEUR HAZY (A) 06/16/2018 1820   LABSPEC 1.022 06/16/2018 1820   PHURINE 5.0 06/16/2018 1820   GLUCOSEU NEGATIVE 06/16/2018 1820   HGBUR SMALL (A) 06/16/2018 1820   BILIRUBINUR NEGATIVE 06/16/2018 1820   KETONESUR NEGATIVE 06/16/2018 1820   PROTEINUR >=300 (A) 06/16/2018 1820   UROBILINOGEN 0.2 05/04/2014 1002   NITRITE NEGATIVE 06/16/2018 1820   LEUKOCYTESUR NEGATIVE 06/16/2018 1820       M.D. [facility] 12/23/2018, 10:35 AM  Pager: [phone] Between 7am to 7pm - call Pager - [phone]  After 7pm go to [email or web address] - password [email or web address]  Call night coverage person covering after 7pm

## 2018-12-24 ENCOUNTER — Encounter: Payer: Medicaid Other | Admitting: Physical Therapy

## 2018-12-24 LAB — BASIC METABOLIC PANEL
Anion gap: 10 (ref 5–15)
BUN: 32 mg/dL — ABNORMAL HIGH (ref 6–20)
CO2: 22 mmol/L (ref 22–32)
Calcium: 8.2 mg/dL — ABNORMAL LOW (ref 8.9–10.3)
Chloride: 106 mmol/L (ref 98–111)
Creatinine, Ser: 2.1 mg/dL — ABNORMAL HIGH (ref 0.61–1.24)
GFR calc Af Amer: 42 mL/min — ABNORMAL LOW (ref >60)
GFR calc non Af Amer: 36 mL/min — ABNORMAL LOW (ref >60)
Glucose, Bld: 101 mg/dL — ABNORMAL HIGH (ref 70–99)
Potassium: 3.7 mmol/L (ref 3.5–5.1)
Sodium: 138 mmol/L (ref 135–145)

## 2018-12-24 LAB — GLUCOSE, CAPILLARY: Glucose-Capillary: 108 mg/dL — ABNORMAL HIGH (ref 70–99)

## 2018-12-24 MED ORDER — SITAGLIPTIN PHOS-METFORMIN HCL 50-1000 MG PO TABS: 1.0000 | ORAL_TABLET | Freq: Every day | ORAL | Status: DC

## 2018-12-24 MED ORDER — LOSARTAN POTASSIUM 25 MG PO TABS: 25.0000 mg | ORAL_TABLET | Freq: Every day | ORAL | 3 refills | Status: DC

## 2018-12-24 MED ORDER — FUROSEMIDE 40 MG PO TABS: 40.0000 mg | ORAL_TABLET | Freq: Every day | ORAL | 3 refills | Status: DC

## 2018-12-24 NOTE — Discharge Summary (Signed)
Physician Discharge Summary   Patient ID: Edward [name] MRN: [id number] DOB/AGE: 04-11-1971 48 y.o.  Admit date: 12/21/2018 Discharge date: 12/24/2018  Primary Care Physician:  [name], MD   Recommendations for Outpatient Follow-up:  1. Follow up with PCP in 1-2 weeks 2. If possible, please obtain bmet in 1 week for kidney function 3. Patient was recommended low-salt diet and to hold losartan for 2 days  Home Health: None  Equipment/Devices:   Discharge Condition: stable  CODE STATUS: FULL    Diet recommendation: Carb modified low-salt diet   Discharge Diagnoses:   Chronic combined systolic and diastolic CHF . AKI (acute kidney injury) on CKD stage III ([place]) . Essential hypertension Mild troponin elevation, likely due to demand ischemia Type 2 diabetes mellitus IDDM, uncontrolled with CKD  Consults: Cardiology    Allergies:  No Known Allergies   DISCHARGE MEDICATIONS: Allergies as of 12/24/2018   No Known Allergies     Medication List    TAKE these medications   acetaminophen 325 MG tablet Commonly known as:  TYLENOL Take 1-2 tablets (325-650 mg total) by mouth every 6 (six) hours as needed for mild pain (pain score 1-3 or temp > 100.5).   ALPRAZolam 0.25 MG tablet Commonly known as:  XANAX Take 1 tablet (0.25 mg total) by mouth 3 (three) times daily as needed for anxiety.   aspirin 81 MG EC tablet Take 1 tablet (81 mg total) by mouth daily.   atorvastatin 80 MG tablet Commonly known as:  LIPITOR Take 1 tablet (80 mg total) by mouth daily at 6 PM.   blood glucose meter kit and supplies Dispense based on patient and insurance preference. Use up to four times daily as directed. (FOR ICD-9 250.00, 250.01).   DULoxetine 30 MG capsule Commonly known as:  Cymbalta Take 1 capsule (30 mg total) by mouth daily.   ezetimibe 10 MG tablet Commonly known as:  ZETIA Take 1 tablet (10 mg total) by mouth daily.   furosemide 40 MG tablet Commonly known as:   LASIX Take 1 tablet (40 mg total) by mouth daily.   hydrALAZINE 50 MG tablet Commonly known as:  APRESOLINE Take 1 tablet (50 mg total) by mouth every 8 (eight) hours.   insulin aspart protamine - aspart (70-30) 100 UNIT/ML FlexPen Commonly known as:  NovoLOG Mix 70/30 FlexPen Inject 0.15 mLs (15 Units total) into the skin 2 (two) times daily with a meal. What changed:    when to take this  reasons to take this   Insulin Pen Needle 31G X 5 MM Misc Use daily to inject insulin as instructed   isosorbide mononitrate 30 MG 24 hr tablet Commonly known as:  IMDUR Take 1 tablet (30 mg total) by mouth daily.   losartan 25 MG tablet Commonly known as:  COZAAR Take 1 tablet (25 mg total) by mouth daily. Start taking on:  Dec 26, 2018 What changed:  These instructions start on Dec 26, 2018. If you are unsure what to do until then, ask your doctor or other care provider.   methocarbamol 500 MG tablet Commonly known as:  ROBAXIN Take 1 tablet (500 mg total) by mouth every 6 (six) hours as needed for muscle spasms.   metoprolol succinate 50 MG 24 hr tablet Commonly known as:  TOPROL-XL Take 1 tablet (50 mg total) by mouth daily. Take with or immediately following a meal.   multivitamin with minerals Tabs tablet Take 1 tablet by mouth daily.   sitaGLIPtin-metformin  50-1000 MG tablet Commonly known as:  JANUMET Take 1 tablet by mouth daily.   ticagrelor 90 MG Tabs tablet Commonly known as:  BRILINTA Take 1 tablet (90 mg total) by mouth 2 (two) times daily.        Brief H and P: For complete details please refer to admission H and P, but in brief Edward Kantner Bowersis a 48 y.o.malewith medical history significant ofchronic combined systolic and diastolic congestive heart failure, CAD status postrecentPCI, type 2 diabetes, hypertension presenting to the hospital for evaluation of shortness of breath.Patient reported2-dayshistory of shortness of breath, orthopnea, and lower  extremity edema. Does endorse increased dietary fluid and salt intake. States he took 3 tablets of Lasix 40 mg yesterday and 2 tablets today. Reports noncompliance with home blood pressure medications.  Denied any fevers, chills, chest pain, sick contacts, recent travel, or exposure to any individual with confirmed or suspected COVID-19.  Patient was admitted for CHF exacerbation.     Hospital Course:    Acute exacerbation of chronic combined systolic and diastolic CHF (congestive heart failure) (HCC) -BNP 631, presented with dyspnea, orthopnea and lower extremity edema.  BKA right leg, left LE with 1-2+ pitting edema -2D echo showed EF 45 to 50% -Patient was placed on IV Lasix 40 mg twice daily, negative balance of 4.0 L. - at the time of admission weight 230lbs, today 228 lbs at discharge -Creatinine trended up to 2.2 from 1.9 yesterday. Baseline Cr 1.6-1.8. -Cardiology consult was obtained, patient was seen by Dr. Acie Fredrickson, felt patient is now more euvolemic and discontinued IV Lasix.  He is okay to go back to his home dose of Lasix. -Creatinine has improved to 2.1.  Holding losartan for 2 days. Patient has cardiology appointment.   Acute kidney injury on CKD stage III Baseline creatinine 1.6-1.8, likely has underlying CKD, needs to follow-up closely with PCP and referral to nephrology outpatient Creatinine trended up to 2.2 with IV Lasix IV Lasix was discontinued as patient is now euvolemic, changed back to home dose of Lasix. Creatinine improved to 2.1, holding losartan for 2 days.  Mild troponin elevation -In the setting of CAD and drug-eluting stent.  No chest pain, probably due to CHF exacerbation. -Continue aspirin, Brilinta, beta-blocker, statin. -Continue hydralazine and Imdur.  Essential hypertension -BP was elevated at the time of presentation, improved  -  continue beta-blocker, hydralazine, Imdur  Type 2 diabetes mellitus, uncontrolled with CKD -Hemoglobin A1c  7.5, continue sliding scale insulin, and home regimen of insulin  Day of Discharge S: Denies any complaints, feels a whole lot better, wants to go home.  Shortness of breath improved, peripheral edema is improving.  BP (!) 138/96 (BP Location: Left Arm)   Pulse 92   Temp 98.3 F (36.8 C) (Oral)   Resp 18   Ht 6' 1"  (1.854 m)   Wt 103.6 kg   SpO2 100%   BMI 30.13 kg/m   Physical Exam: General: Alert and awake oriented x3 not in any acute distress. HEENT: anicteric sclera, pupils reactive to light and accommodation CVS: S1-S2 clear no murmur rubs or gallops Chest: clear to auscultation bilaterally, no wheezing rales or rhonchi Abdomen: soft nontender, nondistended, normal bowel sounds Extremities: Trace edema left lower extremity, right BKA Neuro: Cranial nerves II-XII intact, no focal neurological deficits   The results of significant diagnostics from this hospitalization (including imaging, microbiology, ancillary and laboratory) are listed below for reference.      Procedures/Studies:  2D echo 12/22/2018  1.  The left ventricle has mildly reduced systolic function, with an ejection fraction of 45-50%. The cavity size was mildly dilated. There is mildly increased left ventricular wall thickness. Left ventricular diastolic Doppler parameters are consistent  with pseudonormalization. Elevated mean left atrial pressure.  Dg Chest 2 View  Result Date: 12/21/2018 CLINICAL DATA:  Shortness of breath. EXAM: CHEST - 2 VIEW COMPARISON:  Radiograph of September 24, 2018. FINDINGS: Stable cardiomediastinal silhouette. Mild central pulmonary vascular congestion is noted. Increased bibasilar opacities are noted concerning for pulmonary edema with associated pleural effusions. No pneumothorax is noted. Bony thorax is unremarkable. IMPRESSION: Stable cardiomegaly with central pulmonary vascular congestion. Increased bibasilar edema and pleural effusions is noted. Electronically Signed   By: Marijo Conception M.D.   On: 12/21/2018 14:41       LAB RESULTS: Basic Metabolic Panel: Recent Labs  Lab 12/23/18 0501 12/24/18 0451  NA 137 138  K 3.7 3.7  CL 109 106  CO2 18* 22  GLUCOSE 103* 101*  BUN 37* 32*  CREATININE 2.22* 2.10*  CALCIUM 8.5* 8.2*   Liver Function Tests: Recent Labs  Lab 12/21/18 1450  AST 17  ALT 16  ALKPHOS 47  BILITOT 0.9  PROT 6.5  ALBUMIN 3.4*   Recent Labs  Lab 12/21/18 1450  LIPASE 27   No results for input(s): AMMONIA in the last 168 hours. CBC: Recent Labs  Lab 12/21/18 1424 12/23/18 0501  WBC 5.8 5.7  NEUTROABS  --  3.0  HGB 12.0* 12.8*  HCT 38.1* 38.9*  MCV 87.0 82.9  PLT 230 248   Cardiac Enzymes: Recent Labs  Lab 12/22/18 0024 12/22/18 0423  TROPONINI 0.06* 0.05*   BNP: Invalid input(s): POCBNP CBG: Recent Labs  Lab 12/23/18 2119 12/24/18 0627  GLUCAP 94 108*      Disposition and Follow-up: Discharge Instructions    (HEART FAILURE PATIENTS) Call MD:  Anytime you have any of the following symptoms: 1) 3 pound weight gain in 24 hours or 5 pounds in 1 week 2) shortness of breath, with or without a dry hacking cough 3) swelling in the hands, feet or stomach 4) if you have to sleep on extra pillows at night in order to breathe.   Complete by:  As directed    Diet - low sodium heart healthy   Complete by:  As directed    Diet Carb Modified   Complete by:  As directed    Discharge instructions   Complete by:  As directed    Hold losartan for 2 days   Increase activity slowly   Complete by:  As directed        DISPOSITION: [place]    [name], MD. Go on 12/28/2018.   Specialty:  Family Medicine Why:  if tele visit available @10 :45am Contact information: [address] [place] [address] [phone]        Lelon Perla, MD. Go on 01/05/2019.   Specialty:  Cardiology Why:  or tele visit @1 :30pm office will call you for TELE VISIT Contact  information: [place] [address] [phone]            Time coordinating discharge:  35 minutes  Signed:   Estill Cotta M.D. [facility] 12/24/2018, 10:22 AM

## 2018-12-24 NOTE — Progress Notes (Signed)
Patient made aware to hold losartan until renal function improves. Discharge instructions reviewed with patient. IV removed.

## 2019-01-03 NOTE — Progress Notes (Deleted)
{Choose 1 Note Type (Telehealth Visit or Telephone Visit):[phone]}   Date:  01/03/2019   ID:  Edward [name], DOB October 21, 1970, MRN [id number]  {Patient Location:[phone]::"Home"} {Provider Location:[id number]::"Home"}  PCP:  [name], MD  Cardiologist:  Kirk Ruths, MD  Electrophysiologist:  None   Evaluation Performed:  {Choose Visit [phone]::"Follow-Up Visit"}  Chief Complaint:  ***  History of Present Illness:    Edward [name] is a 48 y.o. male we are seeing for posthospitalization follow-up after being seen on consultation by Dr. Mertie Moores on 12/23/2018 in the setting of CHF decompensation.  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  He presented to the emergency room on 12/21/2018 in the setting of shortness of breath and lower extremity edema, which have worsened over 3 to 5 days with associated orthopnea PND.  Dry weight was self-reported at 227 pounds.  He admitted to dietary noncompliance due to eating a lot of fast food, denied medical noncompliance concerning diuretic that occasionally is noncompliant with blood pressure medications.  Most notably he often forgets to take his third dose of hydralazine.  The patient was given IV diuresis, with a net loss of 4 L, and return to home dose of Lasix at 40 mg daily.  He was to continue to take his medications prior to admission which did include hydralazine 50 mg 3 times daily.  Due to acute kidney injury losartan was held for 2 days, as his creatinine increased to 2.2.  He was to continue losartan as outpatient.  Discharge weight was 228 lbs. of note patient was  tested for COVID-19 during ER evaluation and hospitalization and was found to be negative  The patient {does/does not:200015} have symptoms concerning for COVID-19 infection (fever, chills, cough, or new shortness of breath).    Past Medical History:  Diagnosis Date   Anxiety    self reported   Arthritis    CHF (congestive heart failure) ([place])    Coronary artery disease    Depression    self reported   Diabetes mellitus    Hypertension    Past Surgical History:  Procedure Laterality Date   AMPUTATION Left 05/10/2016   Procedure: Left Transmetatarsal Amputation;  Surgeon: [name], MD;  Location: [place];  Service: Orthopedics;  Laterality: Left;   AMPUTATION Right 05/16/2018   Procedure: PARTIAL RAY AMPUTATION FIFTH TOE RIGHT FOOT;  Surgeon: [name], DPM;  Location: [place];  Service: Podiatry;  Laterality: Right;   AMPUTATION Right 06/17/2018   Procedure: AMPUTATION 4th RAY Right Foot;  Surgeon: Trula Slade, DPM;  Location: [place];  Service: Podiatry;  Laterality: Right;   AMPUTATION Right 06/19/2018   Procedure: RIGHT BELOW KNEE AMPUTATION;  Surgeon: [name], MD;  Location: [place];  Service: Orthopedics;  Laterality: Right;   APPLICATION OF WOUND VAC Right 06/19/2018   Procedure: APPLICATION OF WOUND VAC;  Surgeon: [name], MD;  Location: [place];  Service: Orthopedics;  Laterality: Right;   CARDIAC CATHETERIZATION  07/29/2018   CIRCUMCISION  09/02/2011   Procedure: CIRCUMCISION ADULT;  Surgeon: Molli Hazard, MD;  Location: [place];  Service: Urology;  Laterality: N/A;   CORONARY STENT INTERVENTION N/A 07/29/2018   Procedure: CORONARY STENT INTERVENTION;  Surgeon: Leonie Man, MD;  Location: [place] [facility];  Service: Cardiovascular;  Laterality: N/A;   I&D EXTREMITY  09/02/2011   Procedure: IRRIGATION AND DEBRIDEMENT EXTREMITY;  Surgeon: Theodoro Kos, DO;  Location: [place];  Service:  Plastics;  Laterality: N/A;   INCISION AND DRAINAGE OF WOUND  08/12/2011   Procedure: IRRIGATION AND DEBRIDEMENT WOUND;  Surgeon: Zenovia Jarred, MD;  Location: [place];  Service: General;;  perineum   IRRIGATION AND DEBRIDEMENT ABSCESS  08/12/2011   Procedure: IRRIGATION AND DEBRIDEMENT ABSCESS;  Surgeon: Molli Hazard, MD;  Location: [place];  Service: Urology;  Laterality: N/A;  irrigation and debridment perineum     IRRIGATION AND DEBRIDEMENT ABSCESS  08/14/2011   Procedure: MINOR INCISION AND DRAINAGE OF ABSCESS;  Surgeon: Molli Hazard, MD;  Location: [place];  Service: Urology;  Laterality: N/A;  Perineal Wound Debridement;Placement Bilateral Testicular Thigh Pouches   LEFT HEART CATH AND CORONARY ANGIOGRAPHY N/A 07/29/2018   Procedure: LEFT HEART CATH AND CORONARY ANGIOGRAPHY;  Surgeon: Leonie Man, MD;  Location: [place] [facility];  Service: Cardiovascular;  Laterality: N/A;   TOTAL HIP ARTHROPLASTY Left 10/11/2016   Procedure: LEFT TOTAL HIP ARTHROPLASTY ANTERIOR APPROACH;  Surgeon: Mcarthur Rossetti, MD;  [facility];  Service: Orthopedics;  Laterality: Left;     No outpatient medications have been marked as taking for the 01/05/19 encounter (Appointment) with Lendon Colonel, NP.     Allergies:   Patient has no known allergies.   Social History   Tobacco Use   Smoking status: Current Every Day Smoker    Types: Cigarettes   Smokeless tobacco: Never Used  Substance Use Topics   Alcohol use: Yes    Alcohol/week: 0.0 standard drinks    Comment: has not had alcohol in a month   Drug use: No     Family Hx: The patient's family history includes Diabetes in an other family member.  ROS:   Please see the history of present illness.    *** All other systems reviewed and are negative.   Prior CV studies:   The following studies were reviewed today:  Echocardiogram 12/22/2018 1. The left ventricle has mildly reduced systolic function,  with an ejection fraction of 45-50%. The cavity size was mildly dilated. There is mildly increased left ventricular wall thickness. Left ventricular diastolic Doppler parameters are consistent  with pseudonormalization. Elevated mean left atrial pressure.  2. The right ventricle has normal systolic function. The cavity was normal.  3. Left atrial size was severely dilated.  4. Trivial pericardial effusion is present.  5. The mitral valve is grossly normal. Mitral valve regurgitation is moderate by color flow Doppler.  6. The tricuspid valve is grossly normal.  7. The aortic valve is tricuspid. Mild thickening of the aortic valve. Aortic valve regurgitation is mild by color flow Doppler. No stenosis of the aortic valve.  8. Hyokinesis of the inferolateral wall and apex; overall mildly reduced LV systolic function; mild LVH and LVE; moderate diastolic dysfunction; mild AI; moderate MR; severe LAE.  Cardiac Cath 07/29/2018  Prox Cx to Mid Cx lesion is 85% stenosed.  A drug-eluting stent was successfully placed using a STENT SYNERGY DES 2.5X32. Postdilated and tapered fashion from 2.8 mm proximal and up to 3.1 mm in the distal two thirds of the stent.  Post intervention, there  is a 0% residual stenosis.  LV end diastolic pressure is severely elevated. 25 mmHg  Mid LAD to Dist LAD lesion is 90% stenosed. Distal LAD small vessel. Not PCI target.  Lat 1st Mrg lesion is 75% stenosed.   SUMMARY  Severe two-vessel disease ? Culprit lesion likely being the mid circumflex treated with a DES stent (Synergy DES 2.5 mm x 32 mm -postdilated to 3.1 mm),  ? There is also diffuse extensive disease in the distal/apical portion of the LAD that is not PCI minimal.    Several other very small branch vessels-diagonal branches, lateral OM's etc. have significant disease, but are too small for PTCA.    Otherwise ectatic RCA and proximal to mid LAD.  Severely elevated LVEDP of 25 mmHg (in the setting of  reduced EF) suggestive of ACUTE COMBINED SYSTOLIC AND DIASTOLIC HEART FAILURE   Labs/Other Tests and Data Reviewed:    EKG:  {EKG/Telemetry Strips Reviewed:[phone]}  Recent Labs: 07/28/2018: Magnesium 1.8; TSH 1.140 12/21/2018: ALT 16; B Natriuretic Peptide 631.7 12/23/2018: Hemoglobin 12.8; Platelets 248 12/24/2018: BUN 32; Creatinine, Ser 2.10; Potassium 3.7; Sodium 138   Recent Lipid Panel Lab Results  Component Value Date/Time   CHOL 194 10/16/2018 12:09 PM   TRIG 82 10/16/2018 12:09 PM   HDL 44 10/16/2018 12:09 PM   CHOLHDL 4.4 10/16/2018 12:09 PM   CHOLHDL 4.8 07/28/2018 07:21 AM   LDLCALC 134 (H) 10/16/2018 12:09 PM    Wt Readings from Last 3 Encounters:  12/24/18 228 lb 6.4 oz (103.6 kg)  11/03/18 227 lb (103 kg)  10/29/18 232 lb 9.6 oz (105.5 kg)     Objective:    Vital Signs:  There were no vitals taken for this visit.   {HeartCare Virtual Exam (Optional):[phone]::"VITAL SIGNS:  reviewed"}  ASSESSMENT & PLAN:    1. ***  COVID-19 Education: The signs and symptoms of COVID-19 were discussed with the patient and how to seek care for testing (follow up with PCP or arrange E-visit).  ***The importance of social distancing was discussed today.  Time:   Today, I have spent *** minutes with the patient with telehealth technology discussing the above problems.     Medication Adjustments/Labs and Tests Ordered: Current medicines are reviewed at length with the patient today.  Concerns regarding medicines are outlined above.   Tests Ordered: No orders of the defined types were placed in this encounter.   Medication Changes: No orders of the defined types were placed in this encounter.   Disposition:  Follow up {follow up:15908}  Signed, Phill Myron. [place] Pugh, ANP, AACC  01/03/2019 4:14 PM    [facility]

## 2019-01-05 ENCOUNTER — Other Ambulatory Visit: Payer: Self-pay

## 2019-01-05 ENCOUNTER — Telehealth: Payer: Self-pay | Admitting: Adult Health

## 2019-01-05 ENCOUNTER — Telehealth (INDEPENDENT_AMBULATORY_CARE_PROVIDER_SITE_OTHER): Payer: Medicaid Other | Admitting: Internal Medicine

## 2019-01-05 ENCOUNTER — Telehealth: Payer: Medicaid Other | Admitting: Adult Health

## 2019-01-05 ENCOUNTER — Encounter: Payer: Self-pay | Admitting: Internal Medicine

## 2019-01-05 VITALS — BP 152/98 | HR 85 | Ht 73.0 in | Wt 243.2 lb

## 2019-01-05 DIAGNOSIS — I2510 Atherosclerotic heart disease of native coronary artery without angina pectoris: Secondary | ICD-10-CM

## 2019-01-05 DIAGNOSIS — Z9861 Coronary angioplasty status: Secondary | ICD-10-CM | POA: Diagnosis not present

## 2019-01-05 DIAGNOSIS — I5043 Acute on chronic combined systolic (congestive) and diastolic (congestive) heart failure: Secondary | ICD-10-CM

## 2019-01-05 DIAGNOSIS — I5042 Chronic combined systolic (congestive) and diastolic (congestive) heart failure: Secondary | ICD-10-CM

## 2019-01-05 DIAGNOSIS — Z79899 Other long term (current) drug therapy: Secondary | ICD-10-CM

## 2019-01-05 DIAGNOSIS — R0602 Shortness of breath: Secondary | ICD-10-CM

## 2019-01-05 DIAGNOSIS — I10 Essential (primary) hypertension: Secondary | ICD-10-CM

## 2019-01-05 MED ORDER — HYDRALAZINE HCL 50 MG PO TABS: 75.0000 mg | ORAL_TABLET | Freq: Three times a day (TID) | ORAL | 1 refills | Status: DC

## 2019-01-05 MED ORDER — FUROSEMIDE 40 MG PO TABS: 40.0000 mg | ORAL_TABLET | Freq: Two times a day (BID) | ORAL | 1 refills | Status: DC

## 2019-01-05 MED FILL — hydrALAZINE HCL 50 MG TABS: 50 | 90 days supply | Qty: 405 | Fill #0

## 2019-01-05 NOTE — Patient Instructions (Signed)
Medication Instructions:  INCREASE lasix to 40mg  twice daily INCREASE hydralazine to 75mg  three times daily If you need a refill on your cardiac medications before your next appointment, please call your pharmacy.   Lab work: BMET, BNP in ONE WEEK If you have labs (blood work) drawn today and your tests are completely normal, you will receive your results only by: Marland Kitchen MyChart Message (if you have MyChart) OR . A paper copy in the mail If you have any lab test that is abnormal or we need to change your treatment, we will call you to review the results.   Follow-Up: At [facility], you and your health needs are our priority.  As part of our continuing mission to provide you with exceptional heart care, we have created designated Provider Care Teams.  These Care Teams include your primary Cardiologist (physician) and Advanced Practice Providers (APPs -  Physician Assistants and Nurse Practitioners) who all work together to provide you with the care you need, when you need it. You will need a follow up appointment in 2-3 weeks in office. You may see Kirk Ruths, MD or one of the following Advanced Practice Providers on your designated Care Team:   Kerin Ransom, PA-C Roby Lofts, [place] . Sande Rives, PA-C

## 2019-01-05 NOTE — Progress Notes (Signed)
OFFICE NOTE  Chief Complaint:  Swelling, shortness of breath  Primary Care Physician: [name], MD  HPI:  Edward [name] is a 48 y.o. male with a past medial history significant for coronary artery disease, type 2 diabetes, hypertension and systolic congestive heart failure with prior right BKA, who recently had an end STEMI with PCI to the mid circumflex in December 2019 and was also found to have apical LAD disease that was not amenable to PCI.  He was enrolled in the AEGIS-II trial.  He was also recently admitted for acute systolic congestive heart failure.  Echo as of Dec 22, 2018 was 45 to 50% with pseudonormalization of his diastolic pressures.  He was diuresed 4 to 5 L of fluid and had marked improvement in his edema and shortness of breath.  He was sent home on 40 mg Lasix daily.  He was seen today in the office for follow-up.  He was originally scheduled for virtual appointment but showed up at the office.  He says since discharge he has felt fairly well except he does note some shortness of breath and hears some crackling in his lungs.  He also has had swelling of his left leg but not as severe as it was previously.  Also of note, his blood pressure has been elevated.  He was told to discontinue losartan due to worsening chronic kidney disease.  His creatinine is ranged around 2.2 with a GFR in the 30s consistent with CKD 3B.  PMHx:  Past Medical History:  Diagnosis Date  . Anxiety    self reported  . Arthritis   . CHF (congestive heart failure) ([place])   . Coronary artery disease   . Depression    self reported  . Diabetes mellitus   . Hypertension     Past Surgical History:  Procedure Laterality Date  . AMPUTATION Left 05/10/2016   Procedure: Left Transmetatarsal Amputation;  Surgeon: [name], MD;  Location: [place];  Service: Orthopedics;  Laterality: Left;  . AMPUTATION Right 05/16/2018   Procedure: PARTIAL RAY AMPUTATION FIFTH TOE RIGHT FOOT;  Surgeon: [name], DPM;  Location: [place];  Service: Podiatry;  Laterality: Right;  . AMPUTATION Right 06/17/2018   Procedure: AMPUTATION 4th RAY Right Foot;  Surgeon: Trula Slade, DPM;  Location: [place];  Service: Podiatry;  Laterality: Right;  . AMPUTATION Right 06/19/2018   Procedure: RIGHT BELOW KNEE AMPUTATION;  Surgeon: [name], MD;  Location: [place];  Service: Orthopedics;  Laterality: Right;  . APPLICATION OF WOUND VAC Right 06/19/2018   Procedure: APPLICATION OF WOUND VAC;  Surgeon: [name], MD;  Location: [place];  Service: Orthopedics;  Laterality: Right;  . CARDIAC CATHETERIZATION  07/29/2018  . CIRCUMCISION  09/02/2011   Procedure: CIRCUMCISION ADULT;  Surgeon: Molli Hazard, MD;  Location: [place];  Service: Urology;  Laterality: N/A;  . CORONARY STENT INTERVENTION N/A 07/29/2018   Procedure: CORONARY STENT INTERVENTION;  Surgeon: Leonie Man, MD;  Location: [place] [facility];  Service: Cardiovascular;  Laterality: N/A;  . I&D EXTREMITY  09/02/2011   Procedure: IRRIGATION AND DEBRIDEMENT EXTREMITY;  Surgeon: Theodoro Kos, DO;  Location: [place];  Service: Plastics;  Laterality: N/A;  . INCISION AND DRAINAGE OF WOUND  08/12/2011   Procedure: IRRIGATION AND DEBRIDEMENT WOUND;  Surgeon: Zenovia Jarred, MD;  Location: [place];  Service: General;;  perineum  . IRRIGATION AND DEBRIDEMENT ABSCESS  08/12/2011  Procedure: IRRIGATION AND DEBRIDEMENT ABSCESS;  Surgeon: Molli Hazard, MD;  Location: [place];  Service: Urology;  Laterality: N/A;  irrigation and debridment perineum    . IRRIGATION AND DEBRIDEMENT ABSCESS  08/14/2011   Procedure: MINOR INCISION AND DRAINAGE OF ABSCESS;  Surgeon: Molli Hazard, MD;  Location: [place];  Service: Urology;  Laterality: N/A;  Perineal Wound Debridement;Placement Bilateral Testicular Thigh Pouches  . LEFT HEART CATH AND CORONARY ANGIOGRAPHY N/A 07/29/2018   Procedure: LEFT HEART CATH AND  CORONARY ANGIOGRAPHY;  Surgeon: Leonie Man, MD;  Location: [place] [facility];  Service: Cardiovascular;  Laterality: N/A;  . TOTAL HIP ARTHROPLASTY Left 10/11/2016   Procedure: LEFT TOTAL HIP ARTHROPLASTY ANTERIOR APPROACH;  Surgeon: Mcarthur Rossetti, MD;  [facility];  Service: Orthopedics;  Laterality: Left;    FAMHx:  Family History  Problem Relation Age of Onset  . Diabetes Other     SOCHx:   reports that he has been smoking cigarettes. He has never used smokeless tobacco. He reports current alcohol use. He reports that he does not use drugs.  ALLERGIES:  No Known Allergies  ROS: Pertinent items noted in HPI and remainder of comprehensive ROS otherwise negative.  HOME MEDS: Current Outpatient Medications on File Prior to Visit  Medication Sig Dispense Refill  . acetaminophen (TYLENOL) 325 MG tablet Take 1-2 tablets (325-650 mg total) by mouth every 6 (six) hours as needed for mild pain (pain score 1-3 or temp > 100.5).    Marland Kitchen ALPRAZolam (XANAX) 0.25 MG tablet Take 1 tablet (0.25 mg total) by mouth 3 (three) times daily as needed for anxiety. 30 tablet 0  . aspirin EC 81 MG EC tablet Take 1 tablet (81 mg total) by mouth daily. 90 tablet 3  . atorvastatin (LIPITOR) 80 MG tablet Take 1 tablet (80 mg total) by mouth daily at 6 PM. 90 tablet 3  . blood glucose meter kit and supplies Dispense based on patient and insurance preference. Use up to four times daily as directed. (FOR ICD-9 250.00, 250.01). 1 each 0  . insulin aspart protamine - aspart (NOVOLOG MIX 70/30 FLEXPEN) (70-30) 100 UNIT/ML FlexPen Inject 0.15 mLs (15 Units total) into the skin 2 (two) times daily with a meal. (Patient taking differently: Inject 15 Units into the skin 2 (two) times daily as needed (high blood sugar). ) 15 mL 11  . Insulin Pen Needle 31G X 5 MM MISC Use daily to inject insulin as instructed 100 each 2  . isosorbide mononitrate (IMDUR) 30 MG 24 hr tablet Take 1 tablet (30 mg total) by  mouth daily. 90 tablet 3  . metoprolol succinate (TOPROL-XL) 50 MG 24 hr tablet Take 1 tablet (50 mg total) by mouth daily. Take with or immediately following a meal. 90 tablet 3  . Multiple Vitamin (MULTIVITAMIN WITH MINERALS) TABS tablet Take 1 tablet by mouth daily. 90 tablet 3  . sitaGLIPtin-metformin (JANUMET) 50-1000 MG tablet Take 1 tablet by mouth daily.    . ticagrelor (BRILINTA) 90 MG TABS tablet Take 1 tablet (90 mg total) by mouth 2 (two) times daily. 60 tablet 0  . DULoxetine (CYMBALTA) 30 MG capsule Take 1 capsule (30 mg total) by mouth daily. 30 capsule 1  . ezetimibe (ZETIA) 10 MG tablet Take 1 tablet (10 mg total) by mouth daily. 90 tablet 3   No current facility-administered medications on file prior to visit.     LABS/IMAGING: No results found for this or any previous visit (  from the past 48 hour(s)). No results found.  LIPID PANEL:    Component Value Date/Time   CHOL 194 10/16/2018 1209   TRIG 82 10/16/2018 1209   HDL 44 10/16/2018 1209   CHOLHDL 4.4 10/16/2018 1209   CHOLHDL 4.8 07/28/2018 0721   VLDL 11 07/28/2018 0721   LDLCALC 134 (H) 10/16/2018 1209     WEIGHTS: Wt Readings from Last 3 Encounters:  01/05/19 243 lb 3.2 oz (110.3 kg)  12/24/18 228 lb 6.4 oz (103.6 kg)  11/03/18 227 lb (103 kg)    VITALS: BP (!) 152/98   Pulse 85   Ht 6' 1" (1.854 m)   Wt 243 lb 3.2 oz (110.3 kg)   SpO2 96%   BMI 32.09 kg/m   EXAM: General appearance: alert, no distress and mildly obese Neck: JVD - 7 cm above sternal notch, no carotid bruit and thyroid not enlarged, symmetric, no tenderness/mass/nodules Lungs: diminished breath sounds bilaterally and rales bibasilar Heart: regular rate and rhythm Abdomen: soft, non-tender; bowel sounds normal; no masses,  no organomegaly Extremities: Status post right BKA, 1-2+ left lower extremity pitting edema Pulses: 2+ and symmetric Skin: Chronic left lower extremity venous stasis changes Neurologic: Grossly normal Psych:  Pleasant  EKG: Deferred  ASSESSMENT: 1. Acute on chronic systolic congestive heart failure 2. CAD 3. Hypertension 4. Dyslipidemia 5. CKD3  PLAN: 1.   Mr. Beville has gained weight and is up several pounds since discharge.  He has lower extremity edema, rales and elevated JVP consistent with heart failure.  He is currently on Lasix 40 mg daily.  I recommend increasing that to 40 mg twice daily.  Will obtain lab work including metabolic profile and a BNP next week.  He will need follow-up in 2 to 3 weeks with his primary cardiologist.  Also, his blood pressure is now suboptimally controlled off of losartan.  Given his more advanced kidney disease, would recommend increasing his hydralazine further to 75 mg 3 times daily.  Pixie Casino, MD, [facility], [place] Director of the [facility] Lipid [facility]  Cardiovascular [facility] Diplomate of the American Board of Clinical Lipidology Attending Cardiologist  Direct Dial: [phone]  Fax: [phone]  Website:  [email or web address] Boy 01/05/2019, 5:21 PM

## 2019-01-07 ENCOUNTER — Telehealth: Payer: Self-pay | Admitting: Licensed Clinical Social Worker

## 2019-01-07 NOTE — Telephone Encounter (Signed)
CSW referred to assist patient with obtaining a BP cuff. CSW contacted patient to inform cuff will be delivered to home next week. Patient grateful for support and assistance. CSW available as needed. Jackie Beckey Polkowski, LCSW, CCSW-MCS [phone]  

## 2019-01-12 MED FILL — hydrALAZINE HCL 50 MG TABS: 50 | 30 days supply | Qty: 90 | Fill #2

## 2019-01-14 ENCOUNTER — Other Ambulatory Visit: Payer: Self-pay

## 2019-01-14 ENCOUNTER — Ambulatory Visit: Payer: Medicaid Other | Attending: Physician Assistant | Admitting: Physical Therapy

## 2019-01-14 ENCOUNTER — Encounter: Payer: Self-pay | Admitting: Physical Therapy

## 2019-01-14 VITALS — HR 89 | Resp 18

## 2019-01-14 DIAGNOSIS — R293 Abnormal posture: Secondary | ICD-10-CM | POA: Diagnosis present

## 2019-01-14 DIAGNOSIS — M6281 Muscle weakness (generalized): Secondary | ICD-10-CM | POA: Diagnosis present

## 2019-01-14 DIAGNOSIS — Z9181 History of falling: Secondary | ICD-10-CM

## 2019-01-14 DIAGNOSIS — R2689 Other abnormalities of gait and mobility: Secondary | ICD-10-CM | POA: Diagnosis not present

## 2019-01-14 DIAGNOSIS — R2681 Unsteadiness on feet: Secondary | ICD-10-CM | POA: Diagnosis present

## 2019-01-14 NOTE — Therapy (Signed)
[facility] [address] [place], [place], [address] Phone: [phone]   Fax:  [phone]  Physical Therapy Treatment  Patient Details  Name: Edward [name] MRN: [id number] Date of Birth: May 07, 1971 Referring Provider (PT): [name], [place]   Encounter Date: 01/14/2019  PT End of Session - 01/14/19 2310    Visit Number  2    Number of Visits  17    Authorization Type  Medicaid    PT Start Time  1450    PT Stop Time  1530    PT Time Calculation (min)  40 min    Equipment Utilized During Treatment  Gait belt    Activity Tolerance  Patient tolerated treatment well;Patient limited by fatigue    Behavior During Therapy  [facility] for tasks assessed/performed       Past Medical History:  Diagnosis Date  . Anxiety    self reported  . Arthritis   . CHF (congestive heart failure) ([place])   . Coronary artery disease   . Depression    self reported  . Diabetes mellitus   . Hypertension     Past Surgical History:  Procedure Laterality Date  . AMPUTATION Left 05/10/2016   Procedure: Left Transmetatarsal Amputation;  Surgeon: [name], MD;  Location: [facility];  Service: Orthopedics;  Laterality: Left;  . AMPUTATION Right 05/16/2018   Procedure: PARTIAL RAY AMPUTATION FIFTH TOE RIGHT FOOT;  Surgeon: [name], DPM;  Location: [facility];  Service: Podiatry;  Laterality: Right;  . AMPUTATION Right 06/17/2018   Procedure: AMPUTATION 4th RAY Right Foot;  Surgeon: Trula Slade, DPM;  Location: [facility];  Service: Podiatry;  Laterality: Right;  . AMPUTATION Right 06/19/2018   Procedure: RIGHT BELOW KNEE AMPUTATION;  Surgeon: [name], MD;  Location: [facility];  Service: Orthopedics;  Laterality: Right;  . APPLICATION OF WOUND VAC Right 06/19/2018   Procedure: APPLICATION OF WOUND VAC;  Surgeon: [name], MD;  Location: [facility];  Service: Orthopedics;  Laterality: Right;  . CARDIAC CATHETERIZATION  07/29/2018  .  CIRCUMCISION  09/02/2011   Procedure: CIRCUMCISION ADULT;  Surgeon: Molli Hazard, MD;  Location: [facility];  Service: Urology;  Laterality: N/A;  . CORONARY STENT INTERVENTION N/A 07/29/2018   Procedure: CORONARY STENT INTERVENTION;  Surgeon: Leonie Man, MD;  Location: [facility];  Service: Cardiovascular;  Laterality: N/A;  . I&D EXTREMITY  09/02/2011   Procedure: IRRIGATION AND DEBRIDEMENT EXTREMITY;  Surgeon: Theodoro Kos, DO;  Location: [facility];  Service: Plastics;  Laterality: N/A;  . INCISION AND DRAINAGE OF WOUND  08/12/2011   Procedure: IRRIGATION AND DEBRIDEMENT WOUND;  Surgeon: Zenovia Jarred, MD;  Location: [facility];  Service: General;;  perineum  . IRRIGATION AND DEBRIDEMENT ABSCESS  08/12/2011   Procedure: IRRIGATION AND DEBRIDEMENT ABSCESS;  Surgeon: Molli Hazard, MD;  Location: [facility];  Service: Urology;  Laterality: N/A;  irrigation and debridment perineum    . IRRIGATION AND DEBRIDEMENT ABSCESS  08/14/2011   Procedure: MINOR INCISION AND DRAINAGE OF ABSCESS;  Surgeon: Molli Hazard, MD;  Location: [facility];  Service: Urology;  Laterality: N/A;  Perineal Wound Debridement;Placement Bilateral Testicular Thigh Pouches  . LEFT HEART CATH AND CORONARY ANGIOGRAPHY N/A 07/29/2018   Procedure: LEFT HEART CATH AND CORONARY ANGIOGRAPHY;  Surgeon: Leonie Man, MD;  Location: [facility];  Service: Cardiovascular;  Laterality: N/A;  . TOTAL HIP ARTHROPLASTY Left  10/11/2016   Procedure: LEFT TOTAL HIP ARTHROPLASTY ANTERIOR APPROACH;  Surgeon: Mcarthur Rossetti, MD;  Location: [facility];  Service: Orthopedics;  Laterality: Left;    Vitals:   01/14/19 1405 01/14/19 1520  Pulse: 84 89  Resp: 12 18  SpO2: 96% 95%    Subjective Assessment - 01/14/19 1452    Subjective  He received his prosthesis 09/29/2018. PT evaluation 10/15/2018 with wound on Left foot & Dr. Sharol Given held PT. He was not able to bear weight on left  foot until wound healed which happened in April. But COVID limited his ability to come into PT. He was admitted to hospital 12/21/2018-12/24/2018 with CHF.     Pertinent History  R TTA, L THA 10/11/2016, L transmetarsal amp 2017, CAD, CHF, MI, arthritis, DM, HTN, CKD stage III    Limitations  Lifting;Walking;Standing;House hold activities    Patient Stated Goals  to use prosthesis for balance & walking.     Currently in Pain?  No/denies    Pain Onset  More than a month ago    Pain Onset  More than a month ago         [facility] PT Assessment - 01/14/19 1500      Assessment   Medical Diagnosis  Right Transtibial Amputation      Transfers   Transfers  Sit to Stand;Stand to Sit    Sit to Stand  5: Supervision;With upper extremity assist;With armrests;From chair/3-in-1   needs RW to stabilize   Stand to Sit  5: Supervision;With upper extremity assist;With armrests;To chair/3-in-1;Uncontrolled descent   RW for stability     Ambulation/Gait   Ambulation/Gait  Yes    Ambulation/Gait Assistance  5: Supervision    Ambulation/Gait Assistance Details  excessive UE weight bearing on RW & partial weight on prosthesis.     Ambulation Distance (Feet)  200 Feet    Assistive device  Rolling walker;Prosthesis    Gait Pattern  Step-through pattern;Decreased step length - left;Decreased stance time - right;Decreased stride length;Decreased hip/knee flexion - right;Decreased weight shift to right;Right hip hike;Antalgic;Lateral hip instability;Trunk flexed;Abducted- right    Ambulation Surface  Indoor;Level    Gait velocity  1.75 ft/sec    Stairs  Yes    Stairs Assistance  5: Supervision    Stairs Assistance Details (indicate cue type and reason)  verbal & tactile cues on technique with TTA prosthesis    Stair Management Technique  Two rails;Step to pattern;Forwards    Number of Stairs  4    Ramp  4: Min assist   RW & prosthesis   Ramp Details (indicate cue type and reason)  demo, verbal & tactile cues on  technique with TTA prosthesis    Curb  4: Min assist   RW & prosthesis   Curb Details (indicate cue type and reason)  demo, verbal & tactile cues on technique with TTA prosthesis      Berg Balance Test   Sit to Stand  Needs minimal aid to stand or to stabilize    Standing Unsupported  Needs several tries to stand 30 seconds unsupported    Sitting with Back Unsupported but Feet Supported on Floor or Stool  Able to sit safely and securely 2 minutes    Stand to Sit  Sits independently, has uncontrolled descent    Transfers  Able to transfer safely, definite need of hands    Standing Unsupported with Eyes Closed  Unable to keep eyes closed 3 seconds but stays steady  Standing Unsupported with Feet Together  Needs help to attain position and unable to hold for 15 seconds    From Standing, Reach Forward with Outstretched Arm  Reaches forward but needs supervision    From Standing Position, Pick up Object from Floor  Unable to pick up and needs supervision    From Standing Position, Turn to Look Behind Over each Shoulder  Needs supervision when turning    Turn 360 Degrees  Needs assistance while turning    Standing Unsupported, Alternately Place Feet on Step/Stool  Needs assistance to keep from falling or unable to try    Standing Unsupported, One Foot in [place] balance while stepping or standing    Standing on One Leg  Unable to try or needs assist to prevent fall    Total Score  14      Prosthetics Assessment - 01/14/19 [place] with  Skin check;Residual limb care;Care of non-amputated limb;Prosthetic cleaning;Correct ply sock adjustment;Proper wear schedule/adjustment;Proper weight-bearing schedule/adjustment    Prosthetic Care Comments   PT instructed to wear 2hrs 2x/day.     Donning prosthesis   Supervision    Doffing prosthesis   Supervision    Current prosthetic wear tolerance (days/week)   daily for last 2 weeks.     Current prosthetic  wear tolerance (#hours/day)   2hrs 1x/day    Current prosthetic weight-bearing tolerance (hours/day)   Patient tolerated standing for 5 minutes with partial weight on prosthesis without limb pain or discomfort    Edema  pitting edema     Residual limb condition   right TTA limb no open areas, shiny skin with minimal hair growth, redness over distal half of limb,     K code/activity level with prosthetic use   K2 basic community with fixed cadence                          PT Education - 01/14/19 1530    Education Details  driving with TTA 4 options with recommendation to practice in middle of large empty parking lot     Person(s) Educated  Patient    Methods  Explanation;Demonstration;Verbal cues    Comprehension  Verbalized understanding;Need further instruction       PT Short Term Goals - 01/14/19 2311      PT SHORT TERM GOAL #1   Title  Patient tolerates wear prosthesis greater than 8 hours total per day without skin integrity problems.  (All STGs Target Date 3rd visit after reassessment on 01/14/2019)    Baseline  He was unable to wear prosthesis while wound on left foot. He has worn prosthesis daily for last 2 weeks for only 2 hrs.     Time  3    Period  Weeks    Status  On-going      PT SHORT TERM GOAL #2   Title  patient verbalizes proper cleaning of prosthesis and demonstrates proper donning     Baseline  patient is dependent in all aspects of prosthetic care including cleaning and donning     Time  3    Period  Weeks    Status  On-going      PT SHORT TERM GOAL #3   Title  patient ambulates 300 foot with a rolling Walker an prosthesis with supervision     Baseline  Patient ambulates 200' with RW with significant gait deviations indicating  high fall risk.     Time  3    Period  Weeks    Status  On-going      PT SHORT TERM GOAL #4   Title  patient negotiates ramps and curbs with the rolling Walker with supervision    Baseline  Patient requires minA &  significant gait deviations to negotiate ramps & curbs.     Time  3    Period  Weeks    Status  On-going      PT SHORT TERM GOAL #5   Title  Oceanographer >/= 20/56    Baseline  Berg Balance 14/56    Time  3    Period  Weeks    Status  New        PT Long Term Goals - 01/14/19 2318      PT LONG TERM GOAL #1   Title  Patient is independent with all aspects of prosthetic care to enable safe utilization of the prosthesis (All LTGs Target Date 15th visit after PT reassessment on 01/14/2019)    Baseline  patient is dependent in all aspects of prosthetic care with risk of skin integrity issues     Time  9    Period  Weeks    Status  On-going      PT LONG TERM GOAL #2   Title  patient tolerates wear prosthesis daily for greater than 90% of awake hours without skin integrity problems or limb discomfort to enable function throughout his day     Baseline  Patient wearing prosthesis for last 2 weeks for only 2 hours.     Time  9    Period  Weeks    Status  On-going      PT LONG TERM GOAL #3   Title  Berg balance greater than 36 /56 to indicate lower fall risk and less dependency in standing ADLs     Baseline  Berg balance 14/56     Time  9    Period  Weeks    Status  On-going      PT LONG TERM GOAL #4   Title  patient ambulates 500 feet with LRAD and prosthesis modified independent to enable community access     Baseline  Patient ambulates 200' with RW & prosthesis with supervision with significant gait deviations & partial weight on prosthesis.     Time  9    Period  Weeks    Status  On-going      PT LONG TERM GOAL #5   Title  patient negotiates ramps & curbs with LRAD and stairs with one rail and the prosthesis modified independent to enable community access     Baseline  Patient requires [place] instruction to negotiate ramps & curbs with RW & prosthesis.     Time  9    Period  Weeks    Status  On-going            Plan - 01/14/19 2322    Clinical Impression  Statement  Patient received prosthesis on 09/29/2018. PT evaluated on 10/15/2018 and he had a blister on left Transmetatarsal Amputated foot. Dr. Sharol Given held further PT to limit weight bearing on left foot. He healed wound in April but COVID limited ability to resume PT.  He is dependent in prosthetic care which risks of skin & pain issues.  Patient limits wear to 2hrs per day which limits function during his awake hours. He has shortness  of breath with activities.  Berg Balance 14/56 indicates high fall risk and dependency in standing ADLs. Prosthetic gait is dependent on excessive UE weight bearing on RW and significant gait deviations indicating fall risk. Patient would benefit from skilled PT to progress wear.     Personal Factors and Comorbidities  Comorbidity 3+;Fitness;Time since onset of injury/illness/exacerbation    Comorbidities  R TTA, L THA 10/11/2016, L transmetarsal amp 2017, CAD, CHF, MI, arthritis, DM2, HTN, CKD stage III    Examination-Activity Limitations  Caring for Others;Carry;Locomotion Level;Reach Overhead;Stairs;Stand;Transfers    Examination-Participation Restrictions  Community Activity;Driving;Personal Finances    Stability/Clinical Decision Making  Evolving/Moderate complexity    Clinical Decision Making  Moderate    Rehab Potential  Good    PT Frequency  2x / week   1x/wk for 3 weeks, then 2x/wk for 6 weeks   PT Duration  Other (comment)   9 weeks   PT Treatment/Interventions  ADLs/Self Care Home Management;DME Instruction;Gait training;Stair training;Functional mobility training;[place]alith Repostioning;Therapeutic activities;Therapeutic exercise;Balance training;Neuromuscular re-education;Patient/family education;Prosthetic Training;Manual techniques;Vestibular    PT Next Visit Plan  review prosthetic care, HEP at sink, prosthetic gait training    Consulted and Agree with Plan of Care  Patient       Patient will benefit from skilled therapeutic intervention in order to  improve the following deficits and impairments:  Abnormal gait, Decreased activity tolerance, Decreased balance, Decreased endurance, Decreased knowledge of use of DME, Decreased mobility, Decreased skin integrity, Decreased strength, Prosthetic Dependency, Postural dysfunction, Pain  Visit Diagnosis: Other abnormalities of gait and mobility  Unsteadiness on feet  Muscle weakness (generalized)  Abnormal posture  History of falling     Problem List Patient Active Problem List   Diagnosis Date Noted  . Elevated troponin 12/22/2018  . CHF (congestive heart failure) ([place]) 12/22/2018  . Acute exacerbation of CHF (congestive heart failure) ([place]) 12/21/2018  . Gait abnormality 09/11/2018  . CAD S/P percutaneous coronary angioplasty 08/06/2018  . Cardiomyopathy ([place]) 08/06/2018  . PVD (peripheral vascular disease) ([place]) 08/06/2018  . Shortness of breath   . Acute on chronic combined systolic and diastolic CHF, NYHA class 2 ([place])   . Non-ST elevation (NSTEMI) myocardial infarction ([place]) 07/28/2018  . Respiratory failure with hypoxia ([place]) 07/28/2018  . Acute heart failure ([place]) 07/28/2018  . Hypertensive emergency 07/28/2018  . S/P BKA (below knee amputation) unilateral, right ([place]) 07/15/2018  . Hypoglycemia   . Anxiety about health   . Labile blood glucose   . Essential hypertension   . History of transmetatarsal amputation of left foot ([place])   . Hypoalbuminemia due to protein-calorie malnutrition ([place])   . Stage 3 chronic kidney disease ([place])   . Poorly controlled type 2 diabetes mellitus with peripheral neuropathy ([place])   . Acute blood loss anemia   . Anemia of chronic disease   . Leukocytosis   . Tobacco abuse   . Hyperkalemia 06/20/2018  . Diabetic polyneuropathy associated with type 2 diabetes mellitus ([place])   . Severe protein-calorie malnutrition ([place])   . AKI (acute kidney injury) ([place]) 06/17/2018  . Normocytic anemia 06/17/2018  . DM2 (diabetes mellitus, type 2)  ([place]) 05/14/2018  . Viral pharyngitis 01/14/2017  . Cigarette smoker 01/14/2017  . Avascular necrosis of hip, left ([place]) 10/11/2016  . Status post left hip replacement 10/11/2016  . S/P transmetatarsal amputation of foot, left ([place]) 07/25/2016  . Balance problem 07/25/2016  . Fournier's gangrene 08/16/2011  . Acute respiratory failure ([place]) 08/16/2011  . Septic shock(785.52) 08/16/2011  .  Insulin dependent diabetes mellitus with complications ([place]) 93/[id number]/0177    Latiya Navia PT, DPT 01/14/2019, 11:34 PM  [facility] [address] [place], [place], [address] Phone: [phone]   Fax:  [phone]  Name: Edward [name] MRN: [id number] Date of Birth: 02-Mar-1971

## 2019-01-21 ENCOUNTER — Ambulatory Visit: Payer: Medicaid Other | Attending: Physician Assistant | Admitting: Physical Therapy

## 2019-01-21 ENCOUNTER — Other Ambulatory Visit: Payer: Self-pay

## 2019-01-21 ENCOUNTER — Encounter: Payer: Self-pay | Admitting: Physical Therapy

## 2019-01-21 DIAGNOSIS — M6281 Muscle weakness (generalized): Secondary | ICD-10-CM | POA: Insufficient documentation

## 2019-01-21 DIAGNOSIS — R2689 Other abnormalities of gait and mobility: Secondary | ICD-10-CM | POA: Insufficient documentation

## 2019-01-21 DIAGNOSIS — Z9181 History of falling: Secondary | ICD-10-CM | POA: Insufficient documentation

## 2019-01-21 DIAGNOSIS — M79662 Pain in left lower leg: Secondary | ICD-10-CM | POA: Insufficient documentation

## 2019-01-21 DIAGNOSIS — R2681 Unsteadiness on feet: Secondary | ICD-10-CM | POA: Diagnosis present

## 2019-01-21 DIAGNOSIS — R293 Abnormal posture: Secondary | ICD-10-CM | POA: Diagnosis present

## 2019-01-21 NOTE — Patient Instructions (Signed)
Do each exercise 1-2  times per day Do each exercise 10 repetitions Hold each exercise for 2 seconds to feel your location  AT [place].  USE TAPE ON FLOOR TO MARK THE MIDLINE POSITION. You also should try to feel with your limb pressure in socket.  You are trying to feel with limb what you used to feel with the bottom of your foot.  1. Side to Side Shift: Moving your hips only (not shoulders): move weight onto your left leg, HOLD/FEEL.  Move back to equal weight on each leg, HOLD/FEEL. Move weight onto your right leg, HOLD/FEEL. Move back to equal weight on each leg, HOLD/FEEL. Repeat. Progress from 2 hands to right hand to no hands. 2. Front to Back Shift: Moving your hips only (not shoulders): move your weight forward onto your toes, HOLD/FEEL. Move your weight back to equal Flat Foot on both legs, HOLD/FEEL. Move your weight back onto your heels, HOLD/FEEL. Move your weight back to equal on both legs, HOLD/FEEL. Repeat. Progress from 2 hands to right hand to no hands. 3. Moving Cones / Cups: With equal weight on each leg: Hold on with one hand the first time, then progress to no hand supports. Move cups from one side of sink to the other. Place cups ~2" out of your reach, progress to 10" beyond reach. 4. Overhead/Upward Reaching: alternated reaching up to top cabinets or ceiling if no cabinets present. Keep equal weight on each leg. Start with one hand support on counter while other hand reaches and progress to no hand support with reaching. 5.   Looking Over Shoulders: With equal weight on each leg: alternate turning to look over your shoulders with one hand support on counter as needed. Shift weight to side looking, start with head/eyes turning, then shoulder rotates back, then shift your weight & pull hip back to look behind you. Start with one hand support & progress to no hand support. 6.  Alternate Stepping forefoot into  lower cabinet: shift your hips to put weight on left leg & tighten left hip muscles, step right foot into lower cabinet. Repeat with right leg.

## 2019-01-22 NOTE — Therapy (Signed)
[facility] [address] [place], [place], [address] Phone: [phone]   Fax:  [phone]  Physical Therapy Treatment  Patient Details  Name: Edward [name] MRN: [id number] Date of Birth: 1971-08-03 Referring Provider (PT): [name], [place]   Encounter Date: 01/21/2019  PT End of Session - 01/21/19 1748    Visit Number  3    Number of Visits  17    Authorization Type  Medicaid    Authorization Time Period  3 visits 01/21/2019 - 02/10/2019    Authorization - Visit Number  1    Authorization - Number of Visits  3    PT Start Time  1700    PT Stop Time  3244    PT Time Calculation (min)  45 min    Equipment Utilized During Treatment  Gait belt    Activity Tolerance  Patient tolerated treatment well;Patient limited by fatigue    Behavior During Therapy  [facility] for tasks assessed/performed      CLINIC OPERATION CHANGES: [facility] is open at lower capacity following universal masking, social distancing, and patient screening.  The patient's COVID risk of complications score is 6.   Past Medical History:  Diagnosis Date  . Anxiety    self reported  . Arthritis   . CHF (congestive heart failure) ([place])   . Coronary artery disease   . Depression    self reported  . Diabetes mellitus   . Hypertension     Past Surgical History:  Procedure Laterality Date  . AMPUTATION Left 05/10/2016   Procedure: Left Transmetatarsal Amputation;  Surgeon: [name], MD;  Location: [facility];  Service: Orthopedics;  Laterality: Left;  . AMPUTATION Right 05/16/2018   Procedure: PARTIAL RAY AMPUTATION FIFTH TOE RIGHT FOOT;  Surgeon: [name], DPM;  Location: [facility];  Service: Podiatry;  Laterality: Right;  . AMPUTATION Right 06/17/2018   Procedure: AMPUTATION 4th RAY Right Foot;  Surgeon: Trula Slade, DPM;  Location: [facility];  Service: Podiatry;  Laterality: Right;  . AMPUTATION Right 06/19/2018   Procedure: RIGHT  BELOW KNEE AMPUTATION;  Surgeon: [name], MD;  Location: [facility];  Service: Orthopedics;  Laterality: Right;  . APPLICATION OF WOUND VAC Right 06/19/2018   Procedure: APPLICATION OF WOUND VAC;  Surgeon: [name], MD;  Location: [facility];  Service: Orthopedics;  Laterality: Right;  . CARDIAC CATHETERIZATION  07/29/2018  . CIRCUMCISION  09/02/2011   Procedure: CIRCUMCISION ADULT;  Surgeon: Molli Hazard, MD;  Location: [facility];  Service: Urology;  Laterality: N/A;  . CORONARY STENT INTERVENTION N/A 07/29/2018   Procedure: CORONARY STENT INTERVENTION;  Surgeon: Leonie Man, MD;  Location: [facility];  Service: Cardiovascular;  Laterality: N/A;  . I&D EXTREMITY  09/02/2011   Procedure: IRRIGATION AND DEBRIDEMENT EXTREMITY;  Surgeon: Theodoro Kos, DO;  Location: [facility];  Service: Plastics;  Laterality: N/A;  . INCISION AND DRAINAGE OF WOUND  08/12/2011   Procedure: IRRIGATION AND DEBRIDEMENT WOUND;  Surgeon: Zenovia Jarred, MD;  Location: [facility];  Service: General;;  perineum  . IRRIGATION AND DEBRIDEMENT ABSCESS  08/12/2011   Procedure: IRRIGATION AND DEBRIDEMENT ABSCESS;  Surgeon: Molli Hazard, MD;  Location: [facility];  Service: Urology;  Laterality: N/A;  irrigation and debridment perineum    . IRRIGATION AND DEBRIDEMENT ABSCESS  08/14/2011   Procedure: MINOR INCISION AND DRAINAGE OF ABSCESS;  Surgeon: Molli Hazard, MD;  Location: [facility];  Service: Urology;  Laterality: N/A;  Perineal Wound Debridement;Placement Bilateral Testicular Thigh Pouches  . LEFT HEART CATH AND CORONARY ANGIOGRAPHY N/A 07/29/2018   Procedure: LEFT HEART CATH AND CORONARY ANGIOGRAPHY;  Surgeon: Leonie Man, MD;  Location: [facility];  Service: Cardiovascular;  Laterality: N/A;  . TOTAL HIP ARTHROPLASTY Left 10/11/2016   Procedure: LEFT TOTAL HIP ARTHROPLASTY ANTERIOR APPROACH;  Surgeon: Mcarthur Rossetti, MD;  Location: [facility];   Service: Orthopedics;  Laterality: Left;    There were no vitals filed for this visit.  Subjective Assessment - 01/21/19 1704    Subjective  He is wearing prosthesis 4-5 hr 2x/day.      Pertinent History  R TTA, L THA 10/11/2016, L transmetarsal amp 2017, CAD, CHF, MI, arthritis, DM, HTN, CKD stage III    Limitations  Lifting;Walking;Standing;House hold activities    Patient Stated Goals  to use prosthesis for balance & walking.     Currently in Pain?  No/denies    Pain Onset  More than a month ago    Pain Onset  More than a month ago                       [facility] Adult PT Treatment/Exercise - 01/21/19 1700      Transfers   Transfers  Sit to Stand;Stand to Sit    Sit to Stand  5: Supervision;With upper extremity assist;With armrests;From chair/3-in-1   needs RW to stabilize   Stand to Sit  5: Supervision;With upper extremity assist;With armrests;To chair/3-in-1;Uncontrolled descent   RW for stability     Ambulation/Gait   Ambulation/Gait  Yes    Ambulation/Gait Assistance  5: Supervision    Ambulation/Gait Assistance Details  verbal & tactile cues on proper step length & width    Ambulation Distance (Feet)  200 Feet    Assistive device  Rolling walker;Prosthesis    Gait Pattern  Step-through pattern;Decreased step length - left;Decreased stance time - right;Decreased stride length;Decreased hip/knee flexion - right;Decreased weight shift to right;Right hip hike;Antalgic;Lateral hip instability;Trunk flexed;Abducted- right    Gait velocity  --    Stairs  Yes    Stairs Assistance  5: Supervision    Stairs Assistance Details (indicate cue type and reason)  cues on technique with TTA prosthesis    Stair Management Technique  Two rails;Step to pattern;Forwards;One rail Right;One rail Left   2 hands on single rail   Number of Stairs  4   3 reps   Ramp  5: Supervision   RW & prosthesis   Ramp Details (indicate cue type and reason)  cues on technique with TTA prosthesis     Curb  5: Supervision   RW & prosthesis   Curb Details (indicate cue type and reason)  cues on technique with TTA prosthesis      Neuro Re-ed    Neuro Re-ed Details   HEP at sink - see patient education      Prosthetics   Prosthetic Care Comments   signs of sweating & need to dry limb/liner. Use of antiperspirant morning & evening.  Increase wear 6 hrs 2x/day drying limb/liner q3hrs or signs of sweating    Current prosthetic wear tolerance (days/week)   daily for last 2 weeks.     Current prosthetic wear tolerance (#hours/day)   2hrs 1x/day    Current prosthetic weight-bearing tolerance (hours/day)   Patient tolerated standing for 5 minutes with partial weight on  prosthesis without limb pain or discomfort    Edema  pitting edema     Residual limb condition   right TTA limb no open areas, shiny skin with minimal hair growth, redness over distal half of limb,     Education Provided  Skin check;Residual limb care;Proper Donning;Proper wear schedule/adjustment    Person(s) Educated  Patient    Education Method  Explanation;Demonstration;Tactile cues;Verbal cues    Education Method  Verbalized understanding;Returned demonstration;Tactile cues required;Verbal cues required;Needs further instruction    Donning Prosthesis  Supervision             PT Education - 01/21/19 1744    Education Details  HEP at sink for standing balance, proprioception & strength    Person(s) Educated  Patient    Methods  Explanation;Demonstration;Tactile cues;Verbal cues;Handout    Comprehension  Verbalized understanding;Returned demonstration;Verbal cues required;Tactile cues required;Need further instruction       PT Short Term Goals - 01/14/19 2311      PT SHORT TERM GOAL #1   Title  Patient tolerates wear prosthesis greater than 8 hours total per day without skin integrity problems.  (All STGs Target Date 3rd visit after reassessment on 01/14/2019)    Baseline  He was unable to wear prosthesis while  wound on left foot. He has worn prosthesis daily for last 2 weeks for only 2 hrs.     Time  3    Period  Weeks    Status  On-going      PT SHORT TERM GOAL #2   Title  patient verbalizes proper cleaning of prosthesis and demonstrates proper donning     Baseline  patient is dependent in all aspects of prosthetic care including cleaning and donning     Time  3    Period  Weeks    Status  On-going      PT SHORT TERM GOAL #3   Title  patient ambulates 300 foot with a rolling Walker an prosthesis with supervision     Baseline  Patient ambulates 200' with RW with significant gait deviations indicating high fall risk.     Time  3    Period  Weeks    Status  On-going      PT SHORT TERM GOAL #4   Title  patient negotiates ramps and curbs with the rolling Walker with supervision    Baseline  Patient requires minA & significant gait deviations to negotiate ramps & curbs.     Time  3    Period  Weeks    Status  On-going      PT SHORT TERM GOAL #5   Title  Oceanographer >/= 20/56    Baseline  Berg Balance 14/56    Time  3    Period  Weeks    Status  New        PT Long Term Goals - 01/14/19 2318      PT LONG TERM GOAL #1   Title  Patient is independent with all aspects of prosthetic care to enable safe utilization of the prosthesis (All LTGs Target Date 15th visit after PT reassessment on 01/14/2019)    Baseline  patient is dependent in all aspects of prosthetic care with risk of skin integrity issues     Time  9    Period  Weeks    Status  On-going      PT LONG TERM GOAL #2   Title  patient tolerates wear prosthesis daily for  greater than 90% of awake hours without skin integrity problems or limb discomfort to enable function throughout his day     Baseline  Patient wearing prosthesis for last 2 weeks for only 2 hours.     Time  9    Period  Weeks    Status  On-going      PT LONG TERM GOAL #3   Title  Berg balance greater than 36 /56 to indicate lower fall risk and less  dependency in standing ADLs     Baseline  Berg balance 14/56     Time  9    Period  Weeks    Status  On-going      PT LONG TERM GOAL #4   Title  patient ambulates 500 feet with LRAD and prosthesis modified independent to enable community access     Baseline  Patient ambulates 200' with RW & prosthesis with supervision with significant gait deviations & partial weight on prosthesis.     Time  9    Period  Weeks    Status  On-going      PT LONG TERM GOAL #5   Title  patient negotiates ramps & curbs with LRAD and stairs with one rail and the prosthesis modified independent to enable community access     Baseline  Patient requires [place] instruction to negotiate ramps & curbs with RW & prosthesis.     Time  9    Period  Weeks    Status  On-going            Plan - 01/21/19 2059    Clinical Impression Statement  Today's PT session focused on educating on sweat management and increasing wear.  PT also instructed in negotiating stairs with single rail, ramps & curbs. PT instructed in HEP to address proprioception, weight shifts and balance reactions.     Personal Factors and Comorbidities  Comorbidity 3+;Fitness;Time since onset of injury/illness/exacerbation    Comorbidities  R TTA, L THA 10/11/2016, L transmetarsal amp 2017, CAD, CHF, MI, arthritis, DM2, HTN, CKD stage III    Examination-Activity Limitations  Caring for Others;Carry;Locomotion Level;Reach Overhead;Stairs;Stand;Transfers    Examination-Participation Restrictions  Community Activity;Driving;Personal Finances    Stability/Clinical Decision Making  Evolving/Moderate complexity    Rehab Potential  Good    PT Frequency  2x / week   1x/wk for 3 weeks, then 2x/wk for 6 weeks   PT Duration  Other (comment)   9 weeks   PT Treatment/Interventions  ADLs/Self Care Home Management;DME Instruction;Gait training;Stair training;Functional mobility training;Canalith Repostioning;Therapeutic activities;Therapeutic  exercise;Balance training;Neuromuscular re-education;Patient/family education;Prosthetic Training;Manual techniques;Vestibular    PT Next Visit Plan  review prosthetic care, prosthetic gait training, balance training    Consulted and Agree with Plan of Care  Patient       Patient will benefit from skilled therapeutic intervention in order to improve the following deficits and impairments:  Abnormal gait, Decreased activity tolerance, Decreased balance, Decreased endurance, Decreased knowledge of use of DME, Decreased mobility, Decreased skin integrity, Decreased strength, Prosthetic Dependency, Postural dysfunction, Pain  Visit Diagnosis: Other abnormalities of gait and mobility  Unsteadiness on feet  Muscle weakness (generalized)  Abnormal posture  History of falling     Problem List Patient Active Problem List   Diagnosis Date Noted  . Elevated troponin 12/22/2018  . CHF (congestive heart failure) ([place]) 12/22/2018  . Acute exacerbation of CHF (congestive heart failure) ([place]) 12/21/2018  . Gait abnormality 09/11/2018  . CAD S/P percutaneous coronary  angioplasty 08/06/2018  . Cardiomyopathy ([place]) 08/06/2018  . PVD (peripheral vascular disease) ([place]) 08/06/2018  . Shortness of breath   . Acute on chronic combined systolic and diastolic CHF, NYHA class 2 ([place])   . Non-ST elevation (NSTEMI) myocardial infarction ([place]) 07/28/2018  . Respiratory failure with hypoxia ([place]) 07/28/2018  . Acute heart failure ([place]) 07/28/2018  . Hypertensive emergency 07/28/2018  . S/P BKA (below knee amputation) unilateral, right ([place]) 07/15/2018  . Hypoglycemia   . Anxiety about health   . Labile blood glucose   . Essential hypertension   . History of transmetatarsal amputation of left foot ([place])   . Hypoalbuminemia due to protein-calorie malnutrition ([place])   . Stage 3 chronic kidney disease ([place])   . Poorly controlled type 2 diabetes mellitus with peripheral neuropathy ([place])   . Acute blood  loss anemia   . Anemia of chronic disease   . Leukocytosis   . Tobacco abuse   . Hyperkalemia 06/20/2018  . Diabetic polyneuropathy associated with type 2 diabetes mellitus ([place])   . Severe protein-calorie malnutrition ([place])   . AKI (acute kidney injury) ([place]) 06/17/2018  . Normocytic anemia 06/17/2018  . DM2 (diabetes mellitus, type 2) ([place]) 05/14/2018  . Viral pharyngitis 01/14/2017  . Cigarette smoker 01/14/2017  . Avascular necrosis of hip, left ([place]) 10/11/2016  . Status post left hip replacement 10/11/2016  . S/P transmetatarsal amputation of foot, left ([place]) 07/25/2016  . Balance problem 07/25/2016  . Fournier's gangrene 08/16/2011  . Acute respiratory failure ([place]) 08/16/2011  . Septic shock(785.52) 08/16/2011  . Insulin dependent diabetes mellitus with complications ([place]) 61/68/3729    Elisheva Fallas PT, DPT 01/22/2019, 11:06 AM  [facility] [address] [place], [place], [address] Phone: [phone]   Fax:  [phone]  Name: Edward [name] MRN: [id number] Date of Birth: September 13, 1970

## 2019-01-25 LAB — BASIC METABOLIC PANEL
BUN/Creatinine Ratio: 16 (ref 9–20)
BUN: 32 mg/dL — ABNORMAL HIGH (ref 6–24)
CO2: 17 mmol/L — ABNORMAL LOW (ref 20–29)
Calcium: 8.5 mg/dL — ABNORMAL LOW (ref 8.7–10.2)
Chloride: 110 mmol/L — ABNORMAL HIGH (ref 96–106)
Creatinine, Ser: 1.99 mg/dL — ABNORMAL HIGH (ref 0.76–1.27)
GFR calc Af Amer: 44 mL/min/{1.73_m2} — ABNORMAL LOW (ref >59)
GFR calc non Af Amer: 38 mL/min/{1.73_m2} — ABNORMAL LOW (ref >59)
Glucose: 135 mg/dL — ABNORMAL HIGH (ref 65–99)
Potassium: 4.6 mmol/L (ref 3.5–5.2)
Sodium: 141 mmol/L (ref 134–144)

## 2019-01-25 LAB — PRO B NATRIURETIC PEPTIDE: NT-Pro BNP: 3702 pg/mL — ABNORMAL HIGH (ref 0–121)

## 2019-01-25 MED FILL — FUROSEMIDE 40 MG TAB: 40 | 90 days supply | Qty: 180 | Fill #0

## 2019-01-28 ENCOUNTER — Encounter: Payer: Self-pay | Admitting: Physical Therapy

## 2019-01-28 ENCOUNTER — Other Ambulatory Visit: Payer: Self-pay

## 2019-01-28 ENCOUNTER — Ambulatory Visit: Payer: Medicaid Other | Admitting: Physical Therapy

## 2019-01-28 DIAGNOSIS — R2681 Unsteadiness on feet: Secondary | ICD-10-CM

## 2019-01-28 DIAGNOSIS — R293 Abnormal posture: Secondary | ICD-10-CM

## 2019-01-28 DIAGNOSIS — M6281 Muscle weakness (generalized): Secondary | ICD-10-CM

## 2019-01-28 DIAGNOSIS — R2689 Other abnormalities of gait and mobility: Secondary | ICD-10-CM

## 2019-01-28 DIAGNOSIS — M79662 Pain in left lower leg: Secondary | ICD-10-CM

## 2019-01-29 NOTE — Therapy (Signed)
[facility] [address] [place], [place], [address] Phone: [phone]   Fax:  [phone]  Physical Therapy Treatment  Patient Details  Name: Edward [name] MRN: [id number] Date of Birth: [birth year] Referring Provider (PT): [name], [place]   Encounter Date: 01/28/2019   CLINIC OPERATION CHANGES: [facility]patient [facility] is open at lower capacity following universal masking, social distancing, and patient screening.  The patient's COVID risk of complications score is 6.   PT End of Session - 01/28/19 1853    Visit Number  4    Number of Visits  17    Authorization Type  Medicaid    Authorization Time Period  3 visits 01/21/2019 - 02/10/2019    Authorization - Visit Number  2    Authorization - Number of Visits  3    PT Start Time  1809    PT Stop Time  1849    PT Time Calculation (min)  40 min    Equipment Utilized During Treatment  Gait belt    Activity Tolerance  Patient tolerated treatment well;Patient limited by fatigue    Behavior During Therapy  WFL for tasks assessed/performed       Past Medical History:  Diagnosis Date  . Anxiety    self reported  . Arthritis   . CHF (congestive heart failure) ([place])   . Coronary artery disease   . Depression    self reported  . Diabetes mellitus   . Hypertension     Past Surgical History:  Procedure Laterality Date  . AMPUTATION Left 05/10/2016   Procedure: Left Transmetatarsal Amputation;  Surgeon: [name], MD;  Location: [facility];  Service: Orthopedics;  Laterality: Left;  . AMPUTATION Right 05/16/2018   Procedure: PARTIAL RAY AMPUTATION FIFTH TOE RIGHT FOOT;  Surgeon: [name], DPM;  Location: [facility];  Service: Podiatry;  Laterality: Right;  . AMPUTATION Right 06/17/2018   Procedure: AMPUTATION 4th RAY Right Foot;  Surgeon: Trula Slade, DPM;  Location: [facility];  Service: Podiatry;  Laterality: Right;  . AMPUTATION Right 06/19/2018   Procedure: RIGHT  BELOW KNEE AMPUTATION;  Surgeon: [name], MD;  Location: [facility];  Service: Orthopedics;  Laterality: Right;  . APPLICATION OF WOUND VAC Right 06/19/2018   Procedure: APPLICATION OF WOUND VAC;  Surgeon: [name], MD;  Location: [facility];  Service: Orthopedics;  Laterality: Right;  . CARDIAC CATHETERIZATION  07/29/2018  . CIRCUMCISION  09/02/2011   Procedure: CIRCUMCISION ADULT;  Surgeon: Molli Hazard, MD;  Location: [facility];  Service: Urology;  Laterality: N/A;  . CORONARY STENT INTERVENTION N/A 07/29/2018   Procedure: CORONARY STENT INTERVENTION;  Surgeon: Leonie Man, MD;  Location: [facility];  Service: Cardiovascular;  Laterality: N/A;  . I&D EXTREMITY  09/02/2011   Procedure: IRRIGATION AND DEBRIDEMENT EXTREMITY;  Surgeon: Theodoro Kos, DO;  Location: [facility];  Service: Plastics;  Laterality: N/A;  . INCISION AND DRAINAGE OF WOUND  08/12/2011   Procedure: IRRIGATION AND DEBRIDEMENT WOUND;  Surgeon: Zenovia Jarred, MD;  Location: [facility];  Service: General;;  perineum  . IRRIGATION AND DEBRIDEMENT ABSCESS  08/12/2011   Procedure: IRRIGATION AND DEBRIDEMENT ABSCESS;  Surgeon: Molli Hazard, MD;  Location: [facility];  Service: Urology;  Laterality: N/A;  irrigation and debridment perineum    . IRRIGATION AND DEBRIDEMENT ABSCESS  08/14/2011   Procedure: MINOR INCISION AND DRAINAGE OF ABSCESS;  Surgeon: Dennard Schaumann  Jasmine December, MD;  Location: [facility];  Service: Urology;  Laterality: N/A;  Perineal Wound Debridement;Placement Bilateral Testicular Thigh Pouches  . LEFT HEART CATH AND CORONARY ANGIOGRAPHY N/A 07/29/2018   Procedure: LEFT HEART CATH AND CORONARY ANGIOGRAPHY;  Surgeon: Leonie Man, MD;  Location: [facility];  Service: Cardiovascular;  Laterality: N/A;  . TOTAL HIP ARTHROPLASTY Left 10/11/2016   Procedure: LEFT TOTAL HIP ARTHROPLASTY ANTERIOR APPROACH;  Surgeon: Mcarthur Rossetti, MD;  Location: [facility];   Service: Orthopedics;  Laterality: Left;    There were no vitals filed for this visit.  Subjective Assessment - 01/28/19 1812    Subjective  He is wearing 3-4 hrs 2x/day.    Pertinent History  R TTA, L THA 10/11/2016, L transmetarsal amp 2017, CAD, CHF, MI, arthritis, DM, HTN, CKD stage III    Limitations  Lifting;Walking;Standing;House hold activities    Patient Stated Goals  to use prosthesis for balance & walking.     Currently in Pain?  No/denies    Pain Onset  More than a month ago    Pain Onset  More than a month ago                       [facility] Adult PT Treatment/Exercise - 01/28/19 1815      Transfers   Transfers  Sit to Stand;Stand to Sit    Sit to Stand  5: Supervision;With upper extremity assist;With armrests;From chair/3-in-1   needs RW to stabilize   Stand to Sit  5: Supervision;With upper extremity assist;With armrests;To chair/3-in-1;Uncontrolled descent   RW for stability     Ambulation/Gait   Ambulation/Gait  Yes    Ambulation/Gait Assistance  5: Supervision    Ambulation/Gait Assistance Details  tactile & verbal cues on upright posture & wt shift over prosthesis in stance.     Ambulation Distance (Feet)  200 Feet   200', 100' X2 (enter/exit) and 50' X 4   Assistive device  Rolling walker;Prosthesis    Gait Pattern  Step-through pattern;Decreased step length - left;Decreased stance time - right;Decreased stride length;Decreased hip/knee flexion - right;Decreased weight shift to right;Right hip hike;Antalgic;Lateral hip instability;Trunk flexed;Abducted- right    Ambulation Surface  Indoor;Level    Stairs  --    Stairs Assistance  --    Stair Management Technique  --    Number of Stairs  --    Ramp  --    Curb  --      Neuro Re-ed    Neuro Re-ed Details   --      Prosthetics   Prosthetic Care Comments   adjusting ply socks with gait with too few, too many & correct.  May need to adjust after ~1hr wear in morning as weighting prosthesis pumps  fluid.  Increase wear to 4-5hrs 2x/day.     Current prosthetic wear tolerance (days/week)   daily    Current prosthetic wear tolerance (#hours/day)   3-4 hrs 2x/day with PT recommendation to increase to 4-5 hrs 2x/day    Current prosthetic weight-bearing tolerance (hours/day)   Standing for balance activities 10 minutes without pain.     Edema  pitting edema     Residual limb condition   right TTA limb no open areas, shiny skin with minimal hair growth, redness over distal half of limb,     Education Provided  Skin check;Residual limb care;Proper Donning;Proper wear schedule/adjustment    Person(s) Educated  Patient    Education Method  Explanation;Demonstration;Tactile cues;Verbal cues    Education Method  Verbalized understanding;Returned demonstration;Tactile cues required;Verbal cues required;Needs further instruction          Balance Exercises - 01/28/19 1815      Balance Exercises: Standing   Standing Eyes Opened  Wide (BOA);Head turns;Foam/compliant surface;Other reps (comment)   10 reps right/left, up/down & 2 diagonals, tactile cues   Standing Eyes Closed  Wide (BOA);Solid surface;10 secs;2 reps    Rockerboard  Anterior/posterior;Lateral;10 reps;Intermittent UE support;Head turns    Other Standing Exercises  wide stance on floor: green theraband 10 reps ea alternating & BUEs rows & forward reach          PT Short Term Goals - 01/14/19 2311      PT SHORT TERM GOAL #1   Title  Patient tolerates wear prosthesis greater than 8 hours total per day without skin integrity problems.  (All STGs Target Date 3rd visit after reassessment on 01/14/2019)    Baseline  He was unable to wear prosthesis while wound on left foot. He has worn prosthesis daily for last 2 weeks for only 2 hrs.     Time  3    Period  Weeks    Status  On-going      PT SHORT TERM GOAL #2   Title  patient verbalizes proper cleaning of prosthesis and demonstrates proper donning     Baseline  patient is  dependent in all aspects of prosthetic care including cleaning and donning     Time  3    Period  Weeks    Status  On-going      PT SHORT TERM GOAL #3   Title  patient ambulates 300 foot with a rolling Walker an prosthesis with supervision     Baseline  Patient ambulates 200' with RW with significant gait deviations indicating high fall risk.     Time  3    Period  Weeks    Status  On-going      PT SHORT TERM GOAL #4   Title  patient negotiates ramps and curbs with the rolling Walker with supervision    Baseline  Patient requires minA & significant gait deviations to negotiate ramps & curbs.     Time  3    Period  Weeks    Status  On-going      PT SHORT TERM GOAL #5   Title  Oceanographer >/= 20/56    Baseline  Berg Balance 14/56    Time  3    Period  Weeks    Status  New        PT Long Term Goals - 01/14/19 2318      PT LONG TERM GOAL #1   Title  Patient is independent with all aspects of prosthetic care to enable safe utilization of the prosthesis (All LTGs Target Date 15th visit after PT reassessment on 01/14/2019)    Baseline  patient is dependent in all aspects of prosthetic care with risk of skin integrity issues     Time  9    Period  Weeks    Status  On-going      PT LONG TERM GOAL #2   Title  patient tolerates wear prosthesis daily for greater than 90% of awake hours without skin integrity problems or limb discomfort to enable function throughout his day     Baseline  Patient wearing prosthesis for last 2 weeks for only 2 hours.     Time  9  Period  Weeks    Status  On-going      PT LONG TERM GOAL #3   Title  Berg balance greater than 36 /56 to indicate lower fall risk and less dependency in standing ADLs     Baseline  Berg balance 14/56     Time  9    Period  Weeks    Status  On-going      PT LONG TERM GOAL #4   Title  patient ambulates 500 feet with LRAD and prosthesis modified independent to enable community access     Baseline  Patient ambulates 200'  with RW & prosthesis with supervision with significant gait deviations & partial weight on prosthesis.     Time  9    Period  Weeks    Status  On-going      PT LONG TERM GOAL #5   Title  patient negotiates ramps & curbs with LRAD and stairs with one rail and the prosthesis modified independent to enable community access     Baseline  Patient requires [place] instruction to negotiate ramps & curbs with RW & prosthesis.     Time  9    Period  Weeks    Status  On-going            Plan - 01/28/19 2003    Clinical Impression Statement  Today's skilled session focused on educating on adjusting ply socks and prosthetic gait with RW. PT performed balance activities to facilitate ankle/residual limb & hip strategies.    Personal Factors and Comorbidities  Comorbidity 3+;Fitness;Time since onset of injury/illness/exacerbation    Comorbidities  R TTA, L THA 10/11/2016, L transmetarsal amp 2017, CAD, CHF, MI, arthritis, DM2, HTN, CKD stage III    Examination-Activity Limitations  Caring for Others;Carry;Locomotion Level;Reach Overhead;Stairs;Stand;Transfers    Examination-Participation Restrictions  Community Activity;Driving;Personal Finances    Stability/Clinical Decision Making  Evolving/Moderate complexity    Rehab Potential  Good    PT Frequency  2x / week   1x/wk for 3 weeks, then 2x/wk for 6 weeks   PT Duration  Other (comment)   9 weeks   PT Treatment/Interventions  ADLs/Self Care Home Management;DME Instruction;Gait training;Stair training;Functional mobility training;Canalith Repostioning;Therapeutic activities;Therapeutic exercise;Balance training;Neuromuscular re-education;Patient/family education;Prosthetic Training;Manual techniques;Vestibular    PT Next Visit Plan  check STGs & submit for 12 additional Medicaid visits,  review prosthetic care, prosthetic gait training, balance training    Consulted and Agree with Plan of Care  Patient       Patient will benefit from  skilled therapeutic intervention in order to improve the following deficits and impairments:  Abnormal gait, Decreased activity tolerance, Decreased balance, Decreased endurance, Decreased knowledge of use of DME, Decreased mobility, Decreased skin integrity, Decreased strength, Prosthetic Dependency, Postural dysfunction, Pain  Visit Diagnosis: Other abnormalities of gait and mobility  Unsteadiness on feet  Muscle weakness (generalized)  Abnormal posture  Pain in left lower leg     Problem List Patient Active Problem List   Diagnosis Date Noted  . Elevated troponin 12/22/2018  . CHF (congestive heart failure) ([place]) 12/22/2018  . Acute exacerbation of CHF (congestive heart failure) ([place]) 12/21/2018  . Gait abnormality 09/11/2018  . CAD S/P percutaneous coronary angioplasty 08/06/2018  . Cardiomyopathy ([place]) 08/06/2018  . PVD (peripheral vascular disease) ([place]) 08/06/2018  . Shortness of breath   . Acute on chronic combined systolic and diastolic CHF, NYHA class 2 ([place])   . Non-ST elevation (NSTEMI) myocardial infarction ([place]) 07/28/2018  .  Respiratory failure with hypoxia ([place]) 07/28/2018  . Acute heart failure ([place]) 07/28/2018  . Hypertensive emergency 07/28/2018  . S/P BKA (below knee amputation) unilateral, right ([place]) 07/15/2018  . Hypoglycemia   . Anxiety about health   . Labile blood glucose   . Essential hypertension   . History of transmetatarsal amputation of left foot ([place])   . Hypoalbuminemia due to protein-calorie malnutrition ([place])   . Stage 3 chronic kidney disease ([place])   . Poorly controlled type 2 diabetes mellitus with peripheral neuropathy ([place])   . Acute blood loss anemia   . Anemia of chronic disease   . Leukocytosis   . Tobacco abuse   . Hyperkalemia 06/20/2018  . Diabetic polyneuropathy associated with type 2 diabetes mellitus ([place])   . Severe protein-calorie malnutrition ([place])   . AKI (acute kidney injury) ([place]) 06/17/2018  . Normocytic anemia  06/17/2018  . DM2 (diabetes mellitus, type 2) ([place]) 05/14/2018  . Viral pharyngitis 01/14/2017  . Cigarette smoker 01/14/2017  . Avascular necrosis of hip, left ([place]) 10/11/2016  . Status post left hip replacement 10/11/2016  . S/P transmetatarsal amputation of foot, left ([place]) 07/25/2016  . Balance problem 07/25/2016  . Fournier's gangrene 08/16/2011  . Acute respiratory failure ([place]) 08/16/2011  . Septic shock(785.52) 08/16/2011  . Insulin dependent diabetes mellitus with complications ([place]) 54/56/2563    Esli Jernigan PT, DPT 01/29/2019, 8:06 PM  [facility] [address] [place], [place], [address] Phone: [phone]   Fax:  [phone]  Name: Edward [name] MRN: [id number] Date of Birth: Sep 09, 1970

## 2019-02-02 ENCOUNTER — Encounter: Payer: Self-pay | Admitting: *Deleted

## 2019-02-02 NOTE — Research (Signed)
Edward [name] Pt doing well, going to physical therapy. He has gained some weight and trying to do better. He is still taking his meds as prescribed. I added Brilinta back to his list as he says he is still taking it. Pt was in the hospital for shortness of breath along with some BLE edema in May.                                    "CONSENT"   YES     NO   Continuing further Investigational Product and study visits for follow-up? [x]  []   Continuing consent from future biomedical research [x]  []                                   "EVENTS"    YES     NO  AE   (IF YES SEE SOURCE) [x]  []   SAE  (IF YES SEE SOURCE) [x]  []   ENDPOINT   (IF YES SEE SOURCE) []  [x]   REVASCULARIZATION  (IF YES SEE SOURCE) []  [x]   AMPUTATION   (IF YES SEE SOURCE) []  [x]   TROPONIN'S  (IF YES SEE SOURCE) [x]  []     Lifestyle Adherence Assessment:    YES NO  Abstinence from smoking/remaining tobacco free  X  Cardiac Diet  X  Routine physical activity and/or cardiac rehabilitation X     Current Outpatient Medications:  .  acetaminophen (TYLENOL) 325 MG tablet, Take 1-2 tablets (325-650 mg total) by mouth every 6 (six) hours as needed for mild pain (pain score 1-3 or temp > 100.5)., Disp: , Rfl:  .  ALPRAZolam (XANAX) 0.25 MG tablet, Take 1 tablet (0.25 mg total) by mouth 3 (three) times daily as needed for anxiety., Disp: 30 tablet, Rfl: 0 .  aspirin EC 81 MG EC tablet, Take 1 tablet (81 mg total) by mouth daily., Disp: 90 tablet, Rfl: 3 .  atorvastatin (LIPITOR) 80 MG tablet, Take 1 tablet (80 mg total) by mouth daily at 6 PM., Disp: 90 tablet, Rfl: 3 .  blood glucose meter kit and supplies, Dispense based on patient and insurance preference. Use up to four times daily as directed. (FOR ICD-9 250.00, 250.01)., Disp: 1 each, Rfl: 0 .  DULoxetine (CYMBALTA) 30 MG capsule, Take 1 capsule (30 mg total) by mouth daily., Disp: 30 capsule, Rfl: 1 .  ezetimibe (ZETIA) 10 MG tablet, Take 1 tablet (10 mg total) by mouth daily., Disp: 90  tablet, Rfl: 3 .  furosemide (LASIX) 40 MG tablet, Take 1 tablet (40 mg total) by mouth 2 (two) times daily., Disp: 180 tablet, Rfl: 1 .  hydrALAZINE (APRESOLINE) 50 MG tablet, Take 1.5 tablets (75 mg total) by mouth every 8 (eight) hours., Disp: 405 tablet, Rfl: 1 .  insulin aspart protamine - aspart (NOVOLOG MIX 70/30 FLEXPEN) (70-30) 100 UNIT/ML FlexPen, Inject 0.15 mLs (15 Units total) into the skin 2 (two) times daily with a meal. (Patient taking differently: Inject 15 Units into the skin 2 (two) times daily as needed (high blood sugar). ), Disp: 15 mL, Rfl: 11 .  Insulin Pen Needle 31G X 5 MM MISC, Use daily to inject insulin as instructed, Disp: 100 each, Rfl: 2 .  isosorbide mononitrate (IMDUR) 30 MG 24 hr tablet, Take 1 tablet (30 mg total) by mouth daily., Disp: 90 tablet, Rfl: 3 .  metoprolol  succinate (TOPROL-XL) 50 MG 24 hr tablet, Take 1 tablet (50 mg total) by mouth daily. Take with or immediately following a meal., Disp: 90 tablet, Rfl: 3 .  Multiple Vitamin (MULTIVITAMIN WITH MINERALS) TABS tablet, Take 1 tablet by mouth daily., Disp: 90 tablet, Rfl: 3 .  sitaGLIPtin-metformin (JANUMET) 50-1000 MG tablet, Take 1 tablet by mouth daily., Disp: , Rfl:  .  ticagrelor (BRILINTA) 90 MG TABS tablet, Take 90 mg by mouth 2 (two) times daily., Disp: , Rfl:

## 2019-02-04 ENCOUNTER — Ambulatory Visit: Payer: Medicaid Other | Admitting: Physical Therapy

## 2019-02-04 ENCOUNTER — Other Ambulatory Visit: Payer: Self-pay

## 2019-02-04 ENCOUNTER — Telehealth: Payer: Self-pay

## 2019-02-04 ENCOUNTER — Encounter: Payer: Self-pay | Admitting: Physical Therapy

## 2019-02-04 DIAGNOSIS — R293 Abnormal posture: Secondary | ICD-10-CM

## 2019-02-04 DIAGNOSIS — M79662 Pain in left lower leg: Secondary | ICD-10-CM

## 2019-02-04 DIAGNOSIS — R2689 Other abnormalities of gait and mobility: Secondary | ICD-10-CM | POA: Diagnosis not present

## 2019-02-04 DIAGNOSIS — M6281 Muscle weakness (generalized): Secondary | ICD-10-CM

## 2019-02-04 DIAGNOSIS — Z9181 History of falling: Secondary | ICD-10-CM

## 2019-02-04 DIAGNOSIS — R2681 Unsteadiness on feet: Secondary | ICD-10-CM

## 2019-02-04 NOTE — Patient Instructions (Signed)
Access Code: [email or web address]  URL: [email or web address]  Date: 02/04/2019  Prepared by: Jamey Reas   Exercises Sit to Stand with Armchair - 10 reps - 1 sets - 5 seconds hold - 1x daily - 7x weekly Standing Balance with Eyes Open - 1 reps - 1 sets - 60 seconds hold - 1x daily - 7x weekly Standing Balance with Eyes Closed - 5 reps - 1 sets - 10 seconds hold - 1x daily - 7x weekly Standing with Head movements 4 directions - 10 reps - 4 sets - 2 seconds hold - 1x daily - 7x weekly Side Stepping with Counter Support - 10 reps - 1 sets - 3 seconds hold - 1x daily - 7x weekly

## 2019-02-04 NOTE — Telephone Encounter (Signed)
Called the number listed for the patient but is is disconnected. Call the number listed for his son Juda Toepfer, who is on the patient's DPR informed the son that I was calling to go over the COVID screen questions and to give him some information about coming into the office be hisself. Arzell Mcgeehan stated that he wil have his father give our office a call back.

## 2019-02-04 NOTE — Telephone Encounter (Signed)
Called patient but his number is disconnected. Spoke with son, Edward [name], per dpr, advised him of the template changed and asked if patient would be able to come to the office 15 minutes earlier.  Appt moved to 10:15am to match flex clinic template. Son voiced understanding.

## 2019-02-04 NOTE — Therapy (Addendum)
[facility] [address] [place], [place], [address] Phone: [phone]   Fax:  [phone]  Physical Therapy Treatment  Patient Details  Name: Edward [name] MRN: [id number] Date of Birth: 1970-10-31 Referring Provider (PT): [name], [place]   Encounter Date: 02/04/2019   CLINIC OPERATION CHANGES: [facility] is open at lower capacity following universal masking, social distancing, and patient screening.  The patient's COVID risk of complications score is 6.   PT End of Session - 02/04/19 1912    Visit Number  5    Number of Visits  17    Authorization Type  Medicaid    Authorization Time Period  3 visits 01/21/2019 - 02/10/2019    Authorization - Visit Number  3    Authorization - Number of Visits  3    PT Start Time  1806    PT Stop Time  1850    PT Time Calculation (min)  44 min    Equipment Utilized During Treatment  Gait belt    Activity Tolerance  Patient tolerated treatment well;Patient limited by fatigue    Behavior During Therapy  WFL for tasks assessed/performed       Past Medical History:  Diagnosis Date  . Anxiety    self reported  . Arthritis   . CHF (congestive heart failure) ([place])   . Coronary artery disease   . Depression    self reported  . Diabetes mellitus   . Hypertension     Past Surgical History:  Procedure Laterality Date  . AMPUTATION Left 05/10/2016   Procedure: Left Transmetatarsal Amputation;  Surgeon: [name], MD;  Location: [facility];  Service: Orthopedics;  Laterality: Left;  . AMPUTATION Right 05/16/2018   Procedure: PARTIAL RAY AMPUTATION FIFTH TOE RIGHT FOOT;  Surgeon: [name], DPM;  Location: [facility];  Service: Podiatry;  Laterality: Right;  . AMPUTATION Right 06/17/2018   Procedure: AMPUTATION 4th RAY Right Foot;  Surgeon: Trula Slade, DPM;  Location: [facility];  Service: Podiatry;  Laterality: Right;  . AMPUTATION Right 06/19/2018   Procedure: RIGHT  BELOW KNEE AMPUTATION;  Surgeon: [name], MD;  Location: [facility];  Service: Orthopedics;  Laterality: Right;  . APPLICATION OF WOUND VAC Right 06/19/2018   Procedure: APPLICATION OF WOUND VAC;  Surgeon: [name], MD;  Location: [facility];  Service: Orthopedics;  Laterality: Right;  . CARDIAC CATHETERIZATION  07/29/2018  . CIRCUMCISION  09/02/2011   Procedure: CIRCUMCISION ADULT;  Surgeon: Molli Hazard, MD;  Location: [facility];  Service: Urology;  Laterality: N/A;  . CORONARY STENT INTERVENTION N/A 07/29/2018   Procedure: CORONARY STENT INTERVENTION;  Surgeon: Leonie Man, MD;  Location: [facility];  Service: Cardiovascular;  Laterality: N/A;  . I&D EXTREMITY  09/02/2011   Procedure: IRRIGATION AND DEBRIDEMENT EXTREMITY;  Surgeon: Theodoro Kos, DO;  Location: [facility];  Service: Plastics;  Laterality: N/A;  . INCISION AND DRAINAGE OF WOUND  08/12/2011   Procedure: IRRIGATION AND DEBRIDEMENT WOUND;  Surgeon: Zenovia Jarred, MD;  Location: [facility];  Service: General;;  perineum  . IRRIGATION AND DEBRIDEMENT ABSCESS  08/12/2011   Procedure: IRRIGATION AND DEBRIDEMENT ABSCESS;  Surgeon: Molli Hazard, MD;  Location: [facility];  Service: Urology;  Laterality: N/A;  irrigation and debridment perineum    . IRRIGATION AND DEBRIDEMENT ABSCESS  08/14/2011   Procedure: MINOR INCISION AND DRAINAGE OF ABSCESS;  Surgeon: Dennard Schaumann  Jasmine December, MD;  Location: [facility];  Service: Urology;  Laterality: N/A;  Perineal Wound Debridement;Placement Bilateral Testicular Thigh Pouches  . LEFT HEART CATH AND CORONARY ANGIOGRAPHY N/A 07/29/2018   Procedure: LEFT HEART CATH AND CORONARY ANGIOGRAPHY;  Surgeon: Leonie Man, MD;  Location: [facility];  Service: Cardiovascular;  Laterality: N/A;  . TOTAL HIP ARTHROPLASTY Left 10/11/2016   Procedure: LEFT TOTAL HIP ARTHROPLASTY ANTERIOR APPROACH;  Surgeon: Mcarthur Rossetti, MD;  Location: [facility];   Service: Orthopedics;  Laterality: Left;    There were no vitals filed for this visit.      [facility] PT Assessment - 02/04/19 1805      Assessment   Medical Diagnosis  Right Transtibial Amputation    Referring Provider (PT)  [name], PA      Berg Balance Test   Sit to Stand  Needs minimal aid to stand or to stabilize    Standing Unsupported  Able to stand 30 seconds unsupported    Sitting with Back Unsupported but Feet Supported on Floor or Stool  Able to sit safely and securely 2 minutes    Stand to Sit  Controls descent by using hands    Transfers  Able to transfer safely, definite need of hands    Standing Unsupported with Eyes Closed  Unable to keep eyes closed 3 seconds but stays steady    Standing Unsupported with Feet Together  Needs help to attain position and unable to hold for 15 seconds    From Standing, Reach Forward with Outstretched Arm  Reaches forward but needs supervision    From Standing Position, Pick up Object from Floor  Unable to pick up shoe, but reaches 2-5 cm (1-2") from shoe and balances independently    From Standing Position, Turn to Look Behind Over each Shoulder  Needs supervision when turning    Turn 360 Degrees  Needs assistance while turning    Standing Unsupported, Alternately Place Feet on Step/Stool  Needs assistance to keep from falling or unable to try    Standing Unsupported, One Foot in Front  Loses balance while stepping or standing    Standing on One Leg  Unable to try or needs assist to prevent fall    Total Score  18                   [facility] Adult PT Treatment/Exercise - 02/04/19 1800      Transfers   Transfers  Sit to Stand;Stand to Sit    Sit to Stand  5: Supervision;With upper extremity assist;With armrests;From chair/3-in-1   needs RW to stabilize   Stand to Sit  5: Supervision;With upper extremity assist;With armrests;To chair/3-in-1;Uncontrolled descent   RW for stability     Ambulation/Gait   Ambulation/Gait   Yes    Ambulation/Gait Assistance  5: Supervision    Ambulation Distance (Feet)  300 Feet   300   Assistive device  Rolling walker;Prosthesis    Gait Pattern  Step-through pattern;Decreased step length - left;Decreased stance time - right;Decreased stride length;Decreased hip/knee flexion - right;Decreased weight shift to right;Right hip hike;Antalgic;Lateral hip instability;Trunk flexed;Abducted- right    Ambulation Surface  Indoor;Level    Ramp  5: Supervision   RW & prosthesis   Ramp Details (indicate cue type and reason)  verbal cues on technique    Curb  5: Supervision   RW & prosthesis   Curb Details (indicate cue type and reason)  verbal cues on technique  Knee/Hip Exercises: Standing   Forward Step Up  Left;5 reps;Step Height: 4";Hand Hold: 2   RW support   Forward Step Up Limitations  verbal cues to engage LEs more than UEs     Step Down  Left;5 reps;Hand Hold: 2;Step Height: 4"   RW   Step Down Limitations  verbal cues to engage LEs more than UEs       Prosthetics   Prosthetic Care Comments   reviewed signs of sweating as patient arrive with air escaping noise as he put weight on prosthesis and liner had slid down ~1/2"   PT recommended wear 4 hrs drying limb/liner half way rotating 4hrs on 1 hr off during awake hours.   PT explained difference between residual limb pain, phantom sensation & phantom pain.  After explanation, pt reported phantom sensation.     Current prosthetic wear tolerance (days/week)   daily    Current prosthetic wear tolerance (#hours/day)   wore 6 hrs this morning and was on 2nd hour of 2nd wear.  See prosthetic care comments    Current prosthetic weight-bearing tolerance (hours/day)   Standing for balance activities 10 minutes without pain but fatigues with mild SOB    Edema  pitting edema     Residual limb condition   right TTA limb no open areas, shiny skin with minimal hair growth, redness over distal half of limb,     Education Provided  Skin  check;Residual limb care;Proper Donning;Proper wear schedule/adjustment;Other (comment)   see prosthetic care comments   Person(s) Educated  Patient    Education Method  Explanation;Demonstration;Verbal cues    Education Method  Verbalized understanding;Needs further instruction    Donning Prosthesis  Modified independent (device/increased time)                PT Short Term Goals - 02/04/19 1913      PT SHORT TERM GOAL #1   Title  Patient tolerates wear prosthesis greater than 8 hours total per day without skin integrity problems.  (All STGs Target Date 3rd visit after reassessment on 01/14/2019)    Baseline  MET 02/04/2019    Time  3    Period  Weeks    Status  Achieved      PT SHORT TERM GOAL #2   Title  patient verbalizes proper cleaning of prosthesis and demonstrates proper donning     Baseline  MET 02/04/2019    Time  3    Period  Weeks    Status  Achieved      PT SHORT TERM GOAL #3   Title  patient ambulates 300 foot with a rolling Walker an prosthesis with supervision     Baseline  MET 02/04/2019    Time  3    Period  Weeks    Status  Achieved      PT SHORT TERM GOAL #4   Title  patient negotiates ramps and curbs with the rolling Walker with supervision    Baseline  MET 02/04/2019    Time  3    Period  Weeks    Status  Achieved      PT SHORT TERM GOAL #5   Title  Berg Balance >/= 20/56    Baseline  Progressed but not met  02/04/2019   Berg improved from 14/56 to 18/56    Time  3    Period  Weeks    Status  Partially Met        PT Short Term  Goals - 02/04/19 1922      PT SHORT TERM GOAL #1   Title  Patient tolerates wear prosthesis greater than 12 hours total per day without skin integrity problems.  (All updated STGs Target Date 9th visit after PT reassessment on 01/14/2019)    Baseline  02/04/2019 Patient wearing prosthesis 8hrs /day without skin issues.    Time  3    Period  Weeks    Status  Revised      PT SHORT TERM GOAL #2   Title  patient  verbalizes & demonstrates proper sweat management of prosthesis to minimize skin issues. (All updated STGs Target Date 9th visit after PT reassessment on 01/14/2019)    Baseline  Patient requires skilled PT cues to manage sweat & skin changes from excessive.    Time  3    Period  Weeks    Status  Revised      PT SHORT TERM GOAL #3   Title  patient ambulates 23' with cane quad tip and prosthesis with modA. (All updated STGs Target Date 9th visit after PT reassessment on 01/14/2019)    Baseline  Patient is dependent on bilateral UE support on RW & PT cues.    Time  3    Period  Weeks    Status  Revised      PT SHORT TERM GOAL #4   Title  Patient transfers sit to/from stand from chairs without armrests and stabilizes without UE support / touch with supervision.  (All updated STGs Target Date 9th visit after PT reassessment on 01/14/2019)    Baseline  Patient requires support of RW to stabilize upon arising from chair with armrests.    Time  3    Period  Weeks    Status  New      PT SHORT TERM GOAL #5   Title  Berg Balance >/= 22/56  (All updated STGs Target Date 9th visit after PT reassessment on 01/14/2019)    Baseline  02/04/2019   Merrilee Jansky Balance 18/56    Time  3    Period  Weeks    Status  Revised         PT Long Term Goals - 01/14/19 2318      PT LONG TERM GOAL #1   Title  Patient is independent with all aspects of prosthetic care to enable safe utilization of the prosthesis (All LTGs Target Date 15th visit after PT reassessment on 01/14/2019)    Baseline  patient is dependent in all aspects of prosthetic care with risk of skin integrity issues     Time  9    Period  Weeks    Status  On-going      PT LONG TERM GOAL #2   Title  patient tolerates wear prosthesis daily for greater than 90% of awake hours without skin integrity problems or limb discomfort to enable function throughout his day     Baseline  Patient wearing prosthesis for last 2 weeks for only 2 hours.     Time  9     Period  Weeks    Status  On-going      PT LONG TERM GOAL #3   Title  Berg balance greater than 36 /56 to indicate lower fall risk and less dependency in standing ADLs     Baseline  Berg balance 14/56     Time  9    Period  Weeks    Status  On-going  PT LONG TERM GOAL #4   Title  patient ambulates 500 feet with LRAD and prosthesis modified independent to enable community access     Baseline  Patient ambulates 200' with RW & prosthesis with supervision with significant gait deviations & partial weight on prosthesis.     Time  9    Period  Weeks    Status  On-going      PT LONG TERM GOAL #5   Title  patient negotiates ramps & curbs with LRAD and stairs with one rail and the prosthesis modified independent to enable community access     Baseline  Patient requires [place] instruction to negotiate ramps & curbs with RW & prosthesis.     Time  9    Period  Weeks    Status  On-going            Plan - 02/04/19 1915    Clinical Impression Statement  Patient met or partially met all 5 STGs set for first 3 treatments approved by Medicaid. He still has balance deficits indicating high fall but is improving with Berg Balance score from 14/56 to 18/56.  He still needs skilled instruction for safe progression of prosthesis wear & use. Patient has potential to meet LTGs with additional PT.    Personal Factors and Comorbidities  Comorbidity 3+;Fitness;Time since onset of injury/illness/exacerbation    Comorbidities  R TTA, L THA 10/11/2016, L transmetarsal amp 2017, CAD, CHF, MI, arthritis, DM2, HTN, CKD stage III    Examination-Activity Limitations  Caring for Others;Carry;Locomotion Level;Reach Overhead;Stairs;Stand;Transfers    Examination-Participation Restrictions  Community Activity;Driving;Personal Finances    Stability/Clinical Decision Making  Evolving/Moderate complexity    Rehab Potential  Good    PT Frequency  2x / week   1x/wk for 3 weeks, then 2x/wk for 6 weeks   PT  Duration  Other (comment)   9 weeks   PT Treatment/Interventions  ADLs/Self Care Home Management;DME Instruction;Gait training;Stair training;Functional mobility training;Canalith Repostioning;Therapeutic activities;Therapeutic exercise;Balance training;Neuromuscular re-education;Patient/family education;Prosthetic Training;Manual techniques;Vestibular    PT Next Visit Plan  submit for 12 additional Medicaid visits,  review prosthetic care, prosthetic gait training, balance training, work towards updated STGs.    Consulted and Agree with Plan of Care  Patient       Patient will benefit from skilled therapeutic intervention in order to improve the following deficits and impairments:  Abnormal gait, Decreased activity tolerance, Decreased balance, Decreased endurance, Decreased knowledge of use of DME, Decreased mobility, Decreased skin integrity, Decreased strength, Prosthetic Dependency, Postural dysfunction, Pain  Visit Diagnosis: 1. Other abnormalities of gait and mobility   2. Unsteadiness on feet   3. Muscle weakness (generalized)   4. Abnormal posture   5. Pain in left lower leg   6. History of falling        Problem List Patient Active Problem List   Diagnosis Date Noted  . Elevated troponin 12/22/2018  . CHF (congestive heart failure) ([place]) 12/22/2018  . Acute exacerbation of CHF (congestive heart failure) ([place]) 12/21/2018  . Gait abnormality 09/11/2018  . CAD S/P percutaneous coronary angioplasty 08/06/2018  . Cardiomyopathy ([place]) 08/06/2018  . PVD (peripheral vascular disease) ([place]) 08/06/2018  . Shortness of breath   . Acute on chronic combined systolic and diastolic CHF, NYHA class 2 ([place])   . Non-ST elevation (NSTEMI) myocardial infarction ([place]) 07/28/2018  . Respiratory failure with hypoxia ([place]) 07/28/2018  . Acute heart failure ([place]) 07/28/2018  . Hypertensive emergency 07/28/2018  . S/P BKA (  below knee amputation) unilateral, right ([place]) 07/15/2018  .  Hypoglycemia   . Anxiety about health   . Labile blood glucose   . Essential hypertension   . History of transmetatarsal amputation of left foot ([place])   . Hypoalbuminemia due to protein-calorie malnutrition ([place])   . Stage 3 chronic kidney disease ([place])   . Poorly controlled type 2 diabetes mellitus with peripheral neuropathy ([place])   . Acute blood loss anemia   . Anemia of chronic disease   . Leukocytosis   . Tobacco abuse   . Hyperkalemia 06/20/2018  . Diabetic polyneuropathy associated with type 2 diabetes mellitus ([place])   . Severe protein-calorie malnutrition ([place])   . AKI (acute kidney injury) ([place]) 06/17/2018  . Normocytic anemia 06/17/2018  . DM2 (diabetes mellitus, type 2) ([place]) 05/14/2018  . Viral pharyngitis 01/14/2017  . Cigarette smoker 01/14/2017  . Avascular necrosis of hip, left ([place]) 10/11/2016  . Status post left hip replacement 10/11/2016  . S/P transmetatarsal amputation of foot, left ([place]) 07/25/2016  . Balance problem 07/25/2016  . Fournier's gangrene 08/16/2011  . Acute respiratory failure ([place]) 08/16/2011  . Septic shock(785.52) 08/16/2011  . Insulin dependent diabetes mellitus with complications ([place]) 53/29/9242    Chrystian Ressler PT, DPT 02/04/2019, 7:22 PM  [facility] [address] [place], [place], [address] Phone: [phone]   Fax:  [phone]  Name: Edward [name] MRN: [id number] Date of Birth: [birth year]

## 2019-02-08 ENCOUNTER — Telehealth: Payer: Self-pay | Admitting: Cardiology

## 2019-02-08 NOTE — Telephone Encounter (Signed)
Tried to confirm appt, but, message stated "person not reachable".

## 2019-02-09 ENCOUNTER — Encounter: Payer: Self-pay | Admitting: Cardiology

## 2019-02-09 ENCOUNTER — Other Ambulatory Visit: Payer: Self-pay

## 2019-02-09 ENCOUNTER — Ambulatory Visit (INDEPENDENT_AMBULATORY_CARE_PROVIDER_SITE_OTHER): Payer: Medicaid Other | Admitting: Cardiology

## 2019-02-09 VITALS — BP 134/82 | HR 83 | Ht 73.0 in | Wt 240.2 lb

## 2019-02-09 DIAGNOSIS — I2510 Atherosclerotic heart disease of native coronary artery without angina pectoris: Secondary | ICD-10-CM

## 2019-02-09 DIAGNOSIS — I5043 Acute on chronic combined systolic (congestive) and diastolic (congestive) heart failure: Secondary | ICD-10-CM | POA: Diagnosis not present

## 2019-02-09 DIAGNOSIS — N183 Chronic kidney disease, stage 3 unspecified: Secondary | ICD-10-CM

## 2019-02-09 DIAGNOSIS — E7800 Pure hypercholesterolemia, unspecified: Secondary | ICD-10-CM | POA: Diagnosis not present

## 2019-02-09 DIAGNOSIS — I10 Essential (primary) hypertension: Secondary | ICD-10-CM | POA: Diagnosis not present

## 2019-02-09 DIAGNOSIS — Z9861 Coronary angioplasty status: Secondary | ICD-10-CM

## 2019-02-09 LAB — BASIC METABOLIC PANEL
BUN/Creatinine Ratio: 16 (ref 9–20)
BUN: 32 mg/dL — ABNORMAL HIGH (ref 6–24)
CO2: 18 mmol/L — ABNORMAL LOW (ref 20–29)
Calcium: 8.7 mg/dL (ref 8.7–10.2)
Chloride: 107 mmol/L — ABNORMAL HIGH (ref 96–106)
Creatinine, Ser: 2.03 mg/dL — ABNORMAL HIGH (ref 0.76–1.27)
GFR calc Af Amer: 43 mL/min/{1.73_m2} — ABNORMAL LOW (ref >59)
GFR calc non Af Amer: 37 mL/min/{1.73_m2} — ABNORMAL LOW (ref >59)
Glucose: 122 mg/dL — ABNORMAL HIGH (ref 65–99)
Potassium: 4.3 mmol/L (ref 3.5–5.2)
Sodium: 138 mmol/L (ref 134–144)

## 2019-02-09 LAB — PRO B NATRIURETIC PEPTIDE: NT-Pro BNP: 3841 pg/mL — ABNORMAL HIGH (ref 0–121)

## 2019-02-09 NOTE — Patient Instructions (Signed)
Medication Instructions:  Your physician recommends that you continue on your current medications as directed. Please refer to the Current Medication list given to you today. If you need a refill on your cardiac medications before your next appointment, please call your pharmacy.   Lab work: Your physician recommends that you return for lab work in: TODAY-BMET, BNP If you have labs (blood work) drawn today and your tests are completely normal, you will receive your results only by:  [place] (if you have MyChart) OR  A paper copy in the mail If you have any lab test that is abnormal or we need to change your treatment, we will call you to review the results.  Testing/Procedures: NONE   Follow-Up: At [facility], you and your health needs are our priority.  As part of our continuing mission to provide you with exceptional heart care, we have created designated Provider Care Teams.  These Care Teams include your primary Cardiologist (physician) and Advanced Practice Providers (APPs -  Physician Assistants and Nurse Practitioners) who all work together to provide you with the care you need, when you need it. You will need a follow up appointment in 1 months.   You may see Kirk Ruths, MD or one of the following Advanced Practice Providers on your designated Care Team:   Kerin Ransom, PA-C Roby Lofts, PA-C  Sande Rives, [place]  Any Other Special Instructions Will Be Listed Below (If Applicable). 1. CHECK YOUR WEIGHT DAILY AND RECORD IT  2. EAT A LOW SODIUM DIET 3. INCREASE ACTIVITY  4. WEAR COMPRESSION HOSE ON LEFT LEFT    Low-Sodium Eating Plan Sodium, which is an element that makes up salt, helps you maintain a healthy balance of fluids in your body. Too much sodium can increase your blood pressure and cause fluid and waste to be held in your body. Your health care provider or dietitian may recommend following this plan if you have high blood pressure (hypertension),  kidney disease, liver disease, or heart failure. Eating less sodium can help lower your blood pressure, reduce swelling, and protect your heart, liver, and kidneys. What are tips for following this plan? General guidelines  Most people on this plan should limit their sodium intake to 1,500-2,000 mg (milligrams) of sodium each day. Reading food labels   The Nutrition Facts label lists the amount of sodium in one serving of the food. If you eat more than one serving, you must multiply the listed amount of sodium by the number of servings.  Choose foods with less than 140 mg of sodium per serving.  Avoid foods with 300 mg of sodium or more per serving. Shopping  Look for lower-sodium products, often labeled as "low-sodium" or "no salt added."  Always check the sodium content even if foods are labeled as "unsalted" or "no salt added".  Buy fresh foods. ? Avoid canned foods and premade or frozen meals. ? Avoid canned, cured, or processed meats  Buy breads that have less than 80 mg of sodium per slice. Cooking  Eat more home-cooked food and less restaurant, buffet, and fast food.  Avoid adding salt when cooking. Use salt-free seasonings or herbs instead of table salt or sea salt. Check with your health care provider or pharmacist before using salt substitutes.  Cook with plant-based oils, such as canola, sunflower, or olive oil. Meal planning  When eating at a restaurant, ask that your food be prepared with less salt or no salt, if possible.  Avoid foods that  contain MSG (monosodium glutamate). MSG is sometimes added to [place] food, bouillon, and some canned foods. What foods are recommended? The items listed may not be a complete list. Talk with your dietitian about what dietary choices are best for you. Grains Low-sodium cereals, including oats, puffed wheat and rice, and shredded wheat. Low-sodium crackers. Unsalted rice. Unsalted pasta. Low-sodium bread. Whole-grain breads and  whole-grain pasta. Vegetables Fresh or frozen vegetables. "No salt added" canned vegetables. "No salt added" tomato sauce and paste. Low-sodium or reduced-sodium tomato and vegetable juice. Fruits Fresh, frozen, or canned fruit. Fruit juice. Meats and other protein foods Fresh or frozen (no salt added) meat, poultry, seafood, and fish. Low-sodium canned tuna and salmon. Unsalted nuts. Dried peas, beans, and lentils without added salt. Unsalted canned beans. Eggs. Unsalted nut butters. Dairy Milk. Soy milk. Cheese that is naturally low in sodium, such as ricotta cheese, fresh mozzarella, or Swiss cheese Low-sodium or reduced-sodium cheese. Cream cheese. Yogurt. Fats and oils Unsalted butter. Unsalted margarine with no trans fat. Vegetable oils such as canola or olive oils. Seasonings and other foods Fresh and dried herbs and spices. Salt-free seasonings. Low-sodium mustard and ketchup. Sodium-free salad dressing. Sodium-free light mayonnaise. Fresh or refrigerated horseradish. Lemon juice. Vinegar. Homemade, reduced-sodium, or low-sodium soups. Unsalted popcorn and pretzels. Low-salt or salt-free chips. What foods are not recommended? The items listed may not be a complete list. Talk with your dietitian about what dietary choices are best for you. Grains Instant hot cereals. Bread stuffing, pancake, and biscuit mixes. Croutons. Seasoned rice or pasta mixes. Noodle soup cups. Boxed or frozen macaroni and cheese. Regular salted crackers. Self-rising flour. Vegetables Sauerkraut, pickled vegetables, and relishes. Olives. [place] fries. Onion rings. Regular canned vegetables (not low-sodium or reduced-sodium). Regular canned tomato sauce and paste (not low-sodium or reduced-sodium). Regular tomato and vegetable juice (not low-sodium or reduced-sodium). Frozen vegetables in sauces. Meats and other protein foods Meat or fish that is salted, canned, smoked, spiced, or pickled. Bacon, ham, sausage,  hotdogs, corned beef, chipped beef, packaged lunch meats, salt pork, jerky, pickled herring, anchovies, regular canned tuna, sardines, salted nuts. Dairy Processed cheese and cheese spreads. Cheese curds. Blue cheese. Feta cheese. String cheese. Regular cottage cheese. Buttermilk. Canned milk. Fats and oils Salted butter. Regular margarine. Ghee. Bacon fat. Seasonings and other foods Onion salt, garlic salt, seasoned salt, table salt, and sea salt. Canned and packaged gravies. Worcestershire sauce. Tartar sauce. Barbecue sauce. Teriyaki sauce. Soy sauce, including reduced-sodium. Steak sauce. Fish sauce. Oyster sauce. Cocktail sauce. Horseradish that you find on the shelf. Regular ketchup and mustard. Meat flavorings and tenderizers. Bouillon cubes. Hot sauce and Tabasco sauce. Premade or packaged marinades. Premade or packaged taco seasonings. Relishes. Regular salad dressings. Salsa. Potato and tortilla chips. Corn chips and puffs. Salted popcorn and pretzels. Canned or dried soups. Pizza. Frozen entrees and pot pies. Summary  Eating less sodium can help lower your blood pressure, reduce swelling, and protect your heart, liver, and kidneys.  Most people on this plan should limit their sodium intake to 1,500-2,000 mg (milligrams) of sodium each day.  Canned, boxed, and frozen foods are high in sodium. Restaurant foods, fast foods, and pizza are also very high in sodium. You also get sodium by adding salt to food.  Try to cook at home, eat more fresh fruits and vegetables, and eat less fast food, canned, processed, or prepared foods. This information is not intended to replace advice given to you by your health care provider. Make sure you  discuss any questions you have with your health care provider. Document Released: 01/25/2002 Document Revised: 07/29/2016 Document Reviewed: 07/29/2016 Elsevier Interactive Patient Education  2019 [employer].

## 2019-02-09 NOTE — Progress Notes (Signed)
Cardiology Clinic Note   Patient Name: Edward [name] Date of Encounter: 02/09/2019  Primary Care Provider:  [name], MD Primary Cardiologist:  Kirk Ruths, MD  Patient Profile    Edward [name]. Mase 48 year old male presents for follow-up of his shortness of breath and lower extremity edema.  Past Medical History    Past Medical History:  Diagnosis Date  . Anxiety    self reported  . Arthritis   . CHF (congestive heart failure) ([place])   . Coronary artery disease   . Depression    self reported  . Diabetes mellitus   . Hypertension    Past Surgical History:  Procedure Laterality Date  . AMPUTATION Left 05/10/2016   Procedure: Left Transmetatarsal Amputation;  Surgeon: [name], MD;  Location: [place];  Service: Orthopedics;  Laterality: Left;  . AMPUTATION Right 05/16/2018   Procedure: PARTIAL RAY AMPUTATION FIFTH TOE RIGHT FOOT;  Surgeon: [name], DPM;  Location: [place];  Service: Podiatry;  Laterality: Right;  . AMPUTATION Right 06/17/2018   Procedure: AMPUTATION 4th RAY Right Foot;  Surgeon: Trula Slade, DPM;  Location: [place];  Service: Podiatry;  Laterality: Right;  . AMPUTATION Right 06/19/2018   Procedure: RIGHT BELOW KNEE AMPUTATION;  Surgeon: [name], MD;  Location: [place];  Service: Orthopedics;  Laterality: Right;  . APPLICATION OF WOUND VAC Right 06/19/2018   Procedure: APPLICATION OF WOUND VAC;  Surgeon: [name], MD;  Location: [place];  Service: Orthopedics;  Laterality: Right;  . CARDIAC CATHETERIZATION  07/29/2018  . CIRCUMCISION  09/02/2011   Procedure: CIRCUMCISION ADULT;  Surgeon: Molli Hazard, MD;  Location: [place];  Service: Urology;  Laterality: N/A;  . CORONARY STENT INTERVENTION N/A 07/29/2018   Procedure: CORONARY STENT INTERVENTION;  Surgeon: Leonie Man, MD;  Location: [place] [facility];  Service: Cardiovascular;  Laterality: N/A;  . I&D EXTREMITY  09/02/2011   Procedure: IRRIGATION AND  DEBRIDEMENT EXTREMITY;  Surgeon: Theodoro Kos, DO;  Location: [place];  Service: Plastics;  Laterality: N/A;  . INCISION AND DRAINAGE OF WOUND  08/12/2011   Procedure: IRRIGATION AND DEBRIDEMENT WOUND;  Surgeon: Zenovia Jarred, MD;  Location: [place];  Service: General;;  perineum  . IRRIGATION AND DEBRIDEMENT ABSCESS  08/12/2011   Procedure: IRRIGATION AND DEBRIDEMENT ABSCESS;  Surgeon: Molli Hazard, MD;  Location: [place];  Service: Urology;  Laterality: N/A;  irrigation and debridment perineum    . IRRIGATION AND DEBRIDEMENT ABSCESS  08/14/2011   Procedure: MINOR INCISION AND DRAINAGE OF ABSCESS;  Surgeon: Molli Hazard, MD;  Location: [place];  Service: Urology;  Laterality: N/A;  Perineal Wound Debridement;Placement Bilateral Testicular Thigh Pouches  . LEFT HEART CATH AND CORONARY ANGIOGRAPHY N/A 07/29/2018   Procedure: LEFT HEART CATH AND CORONARY ANGIOGRAPHY;  Surgeon: Leonie Man, MD;  Location: [place] [facility];  Service: Cardiovascular;  Laterality: N/A;  . TOTAL HIP ARTHROPLASTY Left 10/11/2016   Procedure: LEFT TOTAL HIP ARTHROPLASTY ANTERIOR APPROACH;  Surgeon: Mcarthur Rossetti, MD;  Location: [facility];  Service: Orthopedics;  Laterality: Left;    Allergies  No Known Allergies  History of Present Illness    Mr. Werling was last seen by Dr. Debara Pickett 01/05/2019.  During that time he complained of shortness of breath and increased lower extremity edema.  He was also hypertensive.  His Lasix was increased to 40 mg twice daily and his hydralazine was increased to 75  mg 3 times daily.  Due to his CKD losartan was also discontinued.He is here today for a follow up for his recent medication changes and further evaluation.  His PMH includes coronary artery disease, STEMI with PCI to mid circumflex and DES placement ( Also has LAD 90% and 1st marginal 75% disease that was not able to be treated with PCI 07/2018) hypertension, type 2 diabetes systolic  congestive heart failure, and right BKA.  He is a participant in the AEGIS-II trial.  Echocardiogram: 12/22/2018 LVEF 45 to 50%, LV mildly dilated with LVH.  LA severely dilated, trivial pericardial effusion, MR moderate, aortic valve regurgitation mild.  He presents to the office today and states he feels well.  He states that he has no chest pain, no shortness of breath, and decreased lower extremity swelling.  He states that he is now in physical therapy to help with his mobility and his new prosthesis 2 times per week.  He also states he received his blood pressure cuff in the mail and has been recording his blood pressures.  His blood pressure today is 134/82.  He denies chest pain, shortness of breath, melena, hematuria, hemoptysis, weakness, orthopnea and PND.  Home Medications    Prior to Admission medications   Medication Sig Start Date End Date Taking? Authorizing Provider  acetaminophen (TYLENOL) 325 MG tablet Take 1-2 tablets (325-650 mg total) by mouth every 6 (six) hours as needed for mild pain (pain score 1-3 or temp > 100.5). 07/01/18   Angiulli, Lavon Paganini, PA-C  ALPRAZolam Duanne Moron) 0.25 MG tablet Take 1 tablet (0.25 mg total) by mouth 3 (three) times daily as needed for anxiety. 08/04/18   Valarie Merino, MD  aspirin EC 81 MG EC tablet Take 1 tablet (81 mg total) by mouth daily. 08/01/18   Daune Perch, NP  atorvastatin (LIPITOR) 80 MG tablet Take 1 tablet (80 mg total) by mouth daily at 6 PM. 07/31/18   Daune Perch, NP  blood glucose meter kit and supplies Dispense based on patient and insurance preference. Use up to four times daily as directed. (FOR ICD-9 250.00, 250.01). 07/01/18   Angiulli, Lavon Paganini, PA-C  DULoxetine (CYMBALTA) 30 MG capsule Take 1 capsule (30 mg total) by mouth daily. 09/11/18   [name], MD  ezetimibe (ZETIA) 10 MG tablet Take 1 tablet (10 mg total) by mouth daily. 10/21/18 02/02/19  Lelon Perla, MD  furosemide (LASIX) 40 MG tablet Take 1  tablet (40 mg total) by mouth 2 (two) times daily. 01/05/19   Hilty, Nadean Corwin, MD  hydrALAZINE (APRESOLINE) 50 MG tablet Take 1.5 tablets (75 mg total) by mouth every 8 (eight) hours. 01/05/19   Hilty, Nadean Corwin, MD  insulin aspart protamine - aspart (NOVOLOG MIX 70/30 FLEXPEN) (70-30) 100 UNIT/ML FlexPen Inject 0.15 mLs (15 Units total) into the skin 2 (two) times daily with a meal. Patient taking differently: Inject 15 Units into the skin 2 (two) times daily as needed (high blood sugar).  07/01/18   Angiulli, Lavon Paganini, PA-C  Insulin Pen Needle 31G X 5 MM MISC Use daily to inject insulin as instructed 07/01/18   Angiulli, Lavon Paganini, PA-C  isosorbide mononitrate (IMDUR) 30 MG 24 hr tablet Take 1 tablet (30 mg total) by mouth daily. 08/01/18   Daune Perch, NP  metoprolol succinate (TOPROL-XL) 50 MG 24 hr tablet Take 1 tablet (50 mg total) by mouth daily. Take with or immediately following a meal. 08/01/18   Daune Perch, NP  Multiple Vitamin (MULTIVITAMIN WITH MINERALS) TABS tablet Take 1 tablet by mouth daily. 08/01/18   Daune Perch, NP  sitaGLIPtin-metformin (JANUMET) 50-1000 MG tablet Take 1 tablet by mouth daily. 12/24/18   Rai, Vernelle Emerald, MD  ticagrelor (BRILINTA) 90 MG TABS tablet Take 90 mg by mouth 2 (two) times daily.    [provider]    Family History    Family History  Problem Relation Age of Onset  . Diabetes Other    He indicated that the status of his other is unknown.  Social History    Social History   Socioeconomic History  . Marital status: Widowed    Spouse name: Not on file  . Number of children: Not on file  . Years of education: Not on file  . Highest education level: Not on file  Occupational History  . Occupation: Disabled  Social Needs  . Financial resource strain: Not on file  . Food insecurity    Worry: Not on file    Inability: Not on file  . Transportation needs    Medical: Not on file    Non-medical: Not on file  Tobacco Use  .  Smoking status: Current Every Day Smoker    Types: Cigarettes  . Smokeless tobacco: Never Used  Substance and Sexual Activity  . Alcohol use: Yes    Alcohol/week: 0.0 standard drinks    Comment: has not had alcohol in a month  . Drug use: No  . Sexual activity: Not on file  Lifestyle  . Physical activity    Days per week: Not on file    Minutes per session: Not on file  . Stress: Not on file  Relationships  . Social Herbalist on phone: Not on file    Gets together: Not on file    Attends religious service: Not on file    Active member of club or organization: Not on file    Attends meetings of clubs or organizations: Not on file    Relationship status: Not on file  . Intimate partner violence    Fear of current or ex partner: Not on file    Emotionally abused: Not on file    Physically abused: Not on file    Forced sexual activity: Not on file  Other Topics Concern  . Not on file  Social History Narrative   Lives w/ his 2 sons.     Review of Systems    General:  No chills, fever, night sweats or weight changes.  Cardiovascular:  No chest pain, dyspnea on exertion, edema, orthopnea, palpitations, paroxysmal nocturnal dyspnea. Dermatological: No rash, lesions/masses Respiratory: No cough, dyspnea Urologic: No hematuria, dysuria Abdominal:   No nausea, vomiting, diarrhea, bright red blood per rectum, melena, or hematemesis Neurologic:  No visual changes, wkns, changes in mental status. All other systems reviewed and are otherwise negative except as noted above.  Physical Exam    VS:  There were no vitals taken for this visit. , BMI There is no height or weight on file to calculate BMI. GEN: Well nourished, well developed, in no acute distress. HEENT: normal. Neck: Supple, no JVD, carotid bruits, or masses. Cardiac: RRR, no murmurs, rubs, or gallops. No clubbing, cyanosis, +++1 pitting edema.  Radials/DP/PT 2+ and equal bilaterally.  Respiratory:   Respirations regular and unlabored, clear to auscultation bilaterally. GI: Soft, nontender, nondistended, BS + x 4. MS: no deformity or atrophy. Skin: warm and dry, no rash. Neuro:  Strength  and sensation are intact. Psych: Normal affect.  Accessory Clinical Findings    ECG personally reviewed by me today-none today  Assessment & Plan    1.  Acute on chronic systolic congestive heart failure-weight down 3 pounds today, no longer short of breath, +1 pitting edema Continue Lasix 40 mg twice daily for increased diuresis, this may be his new baseline dose.  Patient has not  achieved his discharge weight yet (low to 230s LB).  However, he states he is not short of breath and is clear to auscultation. Continue low-sodium diet-educated about low-sodium choices Daily weights and blood pressures Increase physical activity as tolerated BMP today and BNP Patient will call if scale is not working at home.  2.  Coronary artery disease-no chest pain Continue Brilinta 90 mg twice daily Continue aspirin 81 mg daily Continue isopropamide mononitrate 30 mg daily  3.  Essential hypertension- well-controlled today (134/82) Continue hydralazine 75 mg 3 times daily   4.  Hyperlipidemia-07/28/2018: VLDL 11 10/16/2018: Cholesterol, Total 194; HDL 44; LDL Calculated 134; Triglycerides 82 Continue atorvastatin 80. Check future lipid profile.  5.  CKD 3- creatinine 1.99, BUN 32 BMP today and continue to follow.  Disposition: Follow-up with Dr. Stanford Breed in 1 month.  Deberah Pelton, NP 02/09/2019, 9:11 AM

## 2019-02-15 ENCOUNTER — Ambulatory Visit: Payer: Medicaid Other | Admitting: Physical Therapy

## 2019-02-16 NOTE — Telephone Encounter (Signed)
Opened in error

## 2019-02-18 ENCOUNTER — Ambulatory Visit: Payer: Medicaid Other | Attending: Physician Assistant | Admitting: Physical Therapy

## 2019-02-18 DIAGNOSIS — R293 Abnormal posture: Secondary | ICD-10-CM | POA: Insufficient documentation

## 2019-02-18 DIAGNOSIS — M79662 Pain in left lower leg: Secondary | ICD-10-CM | POA: Insufficient documentation

## 2019-02-18 DIAGNOSIS — Z9181 History of falling: Secondary | ICD-10-CM | POA: Insufficient documentation

## 2019-02-18 DIAGNOSIS — R2681 Unsteadiness on feet: Secondary | ICD-10-CM | POA: Insufficient documentation

## 2019-02-18 DIAGNOSIS — R2689 Other abnormalities of gait and mobility: Secondary | ICD-10-CM | POA: Insufficient documentation

## 2019-02-18 DIAGNOSIS — M6281 Muscle weakness (generalized): Secondary | ICD-10-CM | POA: Insufficient documentation

## 2019-02-22 ENCOUNTER — Other Ambulatory Visit: Payer: Self-pay

## 2019-02-22 ENCOUNTER — Ambulatory Visit: Payer: Medicaid Other | Admitting: Physical Therapy

## 2019-02-22 ENCOUNTER — Encounter: Payer: Self-pay | Admitting: Physical Therapy

## 2019-02-22 DIAGNOSIS — Z9181 History of falling: Secondary | ICD-10-CM | POA: Diagnosis present

## 2019-02-22 DIAGNOSIS — R293 Abnormal posture: Secondary | ICD-10-CM | POA: Diagnosis present

## 2019-02-22 DIAGNOSIS — M6281 Muscle weakness (generalized): Secondary | ICD-10-CM

## 2019-02-22 DIAGNOSIS — R2681 Unsteadiness on feet: Secondary | ICD-10-CM

## 2019-02-22 DIAGNOSIS — R2689 Other abnormalities of gait and mobility: Secondary | ICD-10-CM

## 2019-02-22 DIAGNOSIS — M79662 Pain in left lower leg: Secondary | ICD-10-CM | POA: Diagnosis present

## 2019-02-23 NOTE — Therapy (Signed)
[facility] [address] [place], [place], [address] Phone: [phone]   Fax:  [phone]  Physical Therapy Treatment  Patient Details  Name: Edward [name] MRN: [id number] Date of Birth: Mar 29, 1971 Referring Provider (PT): [name], [place]   Encounter Date: 02/22/2019   CLINIC OPERATION CHANGES: [facility] is open at lower capacity following universal masking, social distancing, and patient screening.   PT End of Session - 02/22/19 1605    Visit Number  6    Number of Visits  17    Authorization Type  Medicaid    Authorization Time Period  3 visits 01/21/2019 - 02/10/2019; 12 visits from 02/15/19 to 03/28/2019    Authorization - Visit Number  1    Authorization - Number of Visits  12    PT Start Time  1602    PT Stop Time  1645    PT Time Calculation (min)  43 min    Equipment Utilized During Treatment  Gait belt    Activity Tolerance  Patient tolerated treatment well;Patient limited by fatigue    Behavior During Therapy  WFL for tasks assessed/performed       Past Medical History:  Diagnosis Date  . Anxiety    self reported  . Arthritis   . CHF (congestive heart failure) ([facility])   . Coronary artery disease   . Depression    self reported  . Diabetes mellitus   . Hypertension     Past Surgical History:  Procedure Laterality Date  . AMPUTATION Left 05/10/2016   Procedure: Left Transmetatarsal Amputation;  Surgeon: [name], MD;  Location: [facility];  Service: Orthopedics;  Laterality: Left;  . AMPUTATION Right 05/16/2018   Procedure: PARTIAL RAY AMPUTATION FIFTH TOE RIGHT FOOT;  Surgeon: [name], DPM;  Location: [facility];  Service: Podiatry;  Laterality: Right;  . AMPUTATION Right 06/17/2018   Procedure: AMPUTATION 4th RAY Right Foot;  Surgeon: Trula Slade, DPM;  Location: [facility];  Service: Podiatry;  Laterality: Right;  . AMPUTATION Right 06/19/2018   Procedure: RIGHT BELOW KNEE  AMPUTATION;  Surgeon: [name], MD;  Location: [facility];  Service: Orthopedics;  Laterality: Right;  . APPLICATION OF WOUND VAC Right 06/19/2018   Procedure: APPLICATION OF WOUND VAC;  Surgeon: [name], MD;  Location: [facility];  Service: Orthopedics;  Laterality: Right;  . CARDIAC CATHETERIZATION  07/29/2018  . CIRCUMCISION  09/02/2011   Procedure: CIRCUMCISION ADULT;  Surgeon: Molli Hazard, MD;  Location: [facility];  Service: Urology;  Laterality: N/A;  . CORONARY STENT INTERVENTION N/A 07/29/2018   Procedure: CORONARY STENT INTERVENTION;  Surgeon: Leonie Man, MD;  Location: [facility];  Service: Cardiovascular;  Laterality: N/A;  . I&D EXTREMITY  09/02/2011   Procedure: IRRIGATION AND DEBRIDEMENT EXTREMITY;  Surgeon: Theodoro Kos, DO;  Location: [facility];  Service: Plastics;  Laterality: N/A;  . INCISION AND DRAINAGE OF WOUND  08/12/2011   Procedure: IRRIGATION AND DEBRIDEMENT WOUND;  Surgeon: Zenovia Jarred, MD;  Location: [facility];  Service: General;;  perineum  . IRRIGATION AND DEBRIDEMENT ABSCESS  08/12/2011   Procedure: IRRIGATION AND DEBRIDEMENT ABSCESS;  Surgeon: Molli Hazard, MD;  Location: [facility];  Service: Urology;  Laterality: N/A;  irrigation and debridment perineum    . IRRIGATION AND DEBRIDEMENT ABSCESS  08/14/2011   Procedure: MINOR INCISION AND DRAINAGE OF ABSCESS;  Surgeon: Molli Hazard, MD;  Location:  [facility];  Service: Urology;  Laterality: N/A;  Perineal Wound Debridement;Placement Bilateral Testicular Thigh Pouches  . LEFT HEART CATH AND CORONARY ANGIOGRAPHY N/A 07/29/2018   Procedure: LEFT HEART CATH AND CORONARY ANGIOGRAPHY;  Surgeon: Leonie Man, MD;  Location: [facility];  Service: Cardiovascular;  Laterality: N/A;  . TOTAL HIP ARTHROPLASTY Left 10/11/2016   Procedure: LEFT TOTAL HIP ARTHROPLASTY ANTERIOR APPROACH;  Surgeon: Mcarthur Rossetti, MD;  Location: [facility];  Service:  Orthopedics;  Laterality: Left;    There were no vitals filed for this visit.  Subjective Assessment - 02/22/19 1604    Subjective  No falls, several close calls with use of cane. No pain. Able to hear air escaping liner with gait into gym.    Pertinent History  R TTA, L THA 10/11/2016, L transmetarsal amp 2017, CAD, CHF, MI, arthritis, DM, HTN, CKD stage III    Limitations  Lifting;Walking;Standing;House hold activities    Patient Stated Goals  to use prosthesis for balance & walking.     Currently in Pain?  No/denies    Pain Score  0-No pain               [facility] Adult PT Treatment/Exercise - 02/22/19 1606      Transfers   Transfers  Sit to Stand;Stand to Sit    Sit to Stand  5: Supervision;With upper extremity assist;With armrests;From chair/3-in-1    Stand to Sit  5: Supervision;With upper extremity assist;With armrests;To chair/3-in-1;Uncontrolled descent      Ambulation/Gait   Ambulation/Gait  Yes    Ambulation/Gait Assistance  5: Supervision    Ambulation/Gait Assistance Details  verbal cues on posture and to increase base of support with gait.     Ambulation Distance (Feet)  120 Feet   x2 reps   Assistive device  Rolling walker;Prosthesis    Gait Pattern  Step-through pattern;Decreased step length - left;Decreased stance time - right;Decreased stride length;Decreased hip/knee flexion - right;Decreased weight shift to right;Right hip hike;Antalgic;Lateral hip instability;Trunk flexed;Abducted- right    Ambulation Surface  Level;Indoor      Neuro Re-ed    Neuro Re-ed Details   neuro re-ed for balance/muscle memory: reviewed HEP issued at last session. min guard assist with cues on posture/weight shifting.       Prosthetics   Prosthetic Care Comments   once again educated pt on air swooshing out of liner is sign to reapply liner and potential need for additional socks. pt redonned liner and added additional 3 ply in session. Pt also noted to be higher on prosthetic side  by ~3/8's. Pt to call [employer] for adjustment/height check and possibly have pads added to socket.     Current prosthetic wear tolerance (days/week)   daily    Residual limb condition   intact with no issues    Donning Prosthesis  Modified independent (device/increased time)               PT Short Term Goals - 02/04/19 1922      PT SHORT TERM GOAL #1   Title  Patient tolerates wear prosthesis greater than 12 hours total per day without skin integrity problems.  (All updated STGs Target Date 9th visit after PT reassessment on 01/14/2019)    Baseline  02/04/2019 Patient wearing prosthesis 8hrs /day without skin issues.    Time  3    Period  Weeks    Status  Revised      PT SHORT TERM GOAL #2  Title  patient verbalizes & demonstrates proper sweat management of prosthesis to minimize skin issues. (All updated STGs Target Date 9th visit after PT reassessment on 01/14/2019)    Baseline  Patient requires skilled PT cues to manage sweat & skin changes from excessive.    Time  3    Period  Weeks    Status  Revised      PT SHORT TERM GOAL #3   Title  patient ambulates 22' with cane quad tip and prosthesis with modA. (All updated STGs Target Date 9th visit after PT reassessment on 01/14/2019)    Baseline  Patient is dependent on bilateral UE support on RW & PT cues.    Time  3    Period  Weeks    Status  Revised      PT SHORT TERM GOAL #4   Title  Patient transfers sit to/from stand from chairs without armrests and stabilizes without UE support / touch with supervision.  (All updated STGs Target Date 9th visit after PT reassessment on 01/14/2019)    Baseline  Patient requires support of RW to stabilize upon arising from chair with armrests.    Time  3    Period  Weeks    Status  New      PT SHORT TERM GOAL #5   Title  Berg Balance >/= 22/56  (All updated STGs Target Date 9th visit after PT reassessment on 01/14/2019)    Baseline  02/04/2019   Merrilee Jansky Balance 18/56    Time  3    Period   Weeks    Status  Revised        PT Long Term Goals - 01/14/19 2318      PT LONG TERM GOAL #1   Title  Patient is independent with all aspects of prosthetic care to enable safe utilization of the prosthesis (All LTGs Target Date 15th visit after PT reassessment on 01/14/2019)    Baseline  patient is dependent in all aspects of prosthetic care with risk of skin integrity issues     Time  9    Period  Weeks    Status  On-going      PT LONG TERM GOAL #2   Title  patient tolerates wear prosthesis daily for greater than 90% of awake hours without skin integrity problems or limb discomfort to enable function throughout his day     Baseline  Patient wearing prosthesis for last 2 weeks for only 2 hours.     Time  9    Period  Weeks    Status  On-going      PT LONG TERM GOAL #3   Title  Berg balance greater than 36 /56 to indicate lower fall risk and less dependency in standing ADLs     Baseline  Berg balance 14/56     Time  9    Period  Weeks    Status  On-going      PT LONG TERM GOAL #4   Title  patient ambulates 500 feet with LRAD and prosthesis modified independent to enable community access     Baseline  Patient ambulates 200' with RW & prosthesis with supervision with significant gait deviations & partial weight on prosthesis.     Time  9    Period  Weeks    Status  On-going      PT LONG TERM GOAL #5   Title  patient negotiates ramps & curbs with LRAD and stairs with  one rail and the prosthesis modified independent to enable community access     Baseline  Patient requires [place] instruction to negotiate ramps & curbs with RW & prosthesis.     Time  9    Period  Weeks    Status  On-going            Plan - 02/22/19 1606    Clinical Impression Statement  Today's skilled session continued to focus on prosthetic education/training and review of HEP issued at last session. The pt is to make an appt with [employer] to have prosthetic adjustments made. The pt is  progressing toward goals and should benefit from continued PT to progress toward unmet goals.    Personal Factors and Comorbidities  Comorbidity 3+;Fitness;Time since onset of injury/illness/exacerbation    Comorbidities  R TTA, L THA 10/11/2016, L transmetarsal amp 2017, CAD, CHF, MI, arthritis, DM2, HTN, CKD stage III    Examination-Activity Limitations  Caring for Others;Carry;Locomotion Level;Reach Overhead;Stairs;Stand;Transfers    Examination-Participation Restrictions  Community Activity;Driving;Personal Finances    Stability/Clinical Decision Making  Evolving/Moderate complexity    Rehab Potential  Good    PT Frequency  2x / week   1x/wk for 3 weeks, then 2x/wk for 6 weeks   PT Duration  Other (comment)   9 weeks   PT Treatment/Interventions  ADLs/Self Care Home Management;DME Instruction;Gait training;Stair training;Functional mobility training;Canalith Repostioning;Therapeutic activities;Therapeutic exercise;Balance training;Neuromuscular re-education;Patient/family education;Prosthetic Training;Manual techniques;Vestibular    PT Next Visit Plan  prosthetic gait training with cane at counter/table top progressing to away from second surface, balance training, work towards updated STGs.    PT Home Exercise Plan  Access Code: [email or web address] & Amputee sink HEP    Consulted and Agree with Plan of Care  Patient       Patient will benefit from skilled therapeutic intervention in order to improve the following deficits and impairments:  Abnormal gait, Decreased activity tolerance, Decreased balance, Decreased endurance, Decreased knowledge of use of DME, Decreased mobility, Decreased skin integrity, Decreased strength, Prosthetic Dependency, Postural dysfunction, Pain  Visit Diagnosis: 1. Other abnormalities of gait and mobility   2. Unsteadiness on feet   3. Muscle weakness (generalized)   4. Abnormal posture        Problem List Patient Active Problem List   Diagnosis Date Noted  .  Elevated troponin 12/22/2018  . CHF (congestive heart failure) ([facility]) 12/22/2018  . Acute exacerbation of CHF (congestive heart failure) ([facility]) 12/21/2018  . Gait abnormality 09/11/2018  . CAD S/P percutaneous coronary angioplasty 08/06/2018  . Cardiomyopathy ([facility] [place]) 08/06/2018  . PVD (peripheral vascular disease) ([facility]) 08/06/2018  . Shortness of breath   . Acute on chronic combined systolic and diastolic CHF, NYHA class 2 ([facility])   . Non-ST elevation (NSTEMI) myocardial infarction ([facility]) 07/28/2018  . Respiratory failure with hypoxia ([facility]) 07/28/2018  . Acute heart failure ([facility]) 07/28/2018  . Hypertensive emergency 07/28/2018  . S/P BKA (below knee amputation) unilateral, right ([facility]) 07/15/2018  . Hypoglycemia   . Anxiety about health   . Labile blood glucose   . Essential hypertension   . History of transmetatarsal amputation of left foot ([facility])   . Hypoalbuminemia due to protein-calorie malnutrition ([facility])   . Stage 3 chronic kidney disease ([place] [facility])   . Poorly controlled type 2 diabetes mellitus with peripheral neuropathy ([place])   . Acute blood loss anemia   . Anemia of chronic disease   . Leukocytosis   . Tobacco abuse   . Hyperkalemia 06/20/2018  .  Diabetic polyneuropathy associated with type 2 diabetes mellitus ([place])   . Severe protein-calorie malnutrition ([facility])   . AKI (acute kidney injury) ([facility]) 06/17/2018  . Normocytic anemia 06/17/2018  . DM2 (diabetes mellitus, type 2) ([facility]) 05/14/2018  . Viral pharyngitis 01/14/2017  . Cigarette smoker 01/14/2017  . Avascular necrosis of hip, left ([place]) 10/11/2016  . Status post left hip replacement 10/11/2016  . S/P transmetatarsal amputation of foot, left ([facility]) 07/25/2016  . Balance problem 07/25/2016  . Fournier's gangrene 08/16/2011  . Acute respiratory failure ([place]) 08/16/2011  . Septic shock(785.52) 08/16/2011  . Insulin dependent diabetes mellitus with complications ([facility]) 21/10/1279    Willow Ora, PTA, [facility] [address], [place], [place] [address] [phone] 02/23/19, 2:32 PM   Name: TEDFORD BERG MRN: [id number] Date of Birth: Mar 30, 1971

## 2019-02-25 ENCOUNTER — Ambulatory Visit: Payer: Medicaid Other | Admitting: Physical Therapy

## 2019-03-01 ENCOUNTER — Ambulatory Visit: Payer: Medicaid Other | Admitting: Physical Therapy

## 2019-03-01 ENCOUNTER — Encounter: Payer: Self-pay | Admitting: Physical Therapy

## 2019-03-01 ENCOUNTER — Other Ambulatory Visit: Payer: Self-pay

## 2019-03-01 DIAGNOSIS — M6281 Muscle weakness (generalized): Secondary | ICD-10-CM

## 2019-03-01 DIAGNOSIS — R2689 Other abnormalities of gait and mobility: Secondary | ICD-10-CM | POA: Diagnosis not present

## 2019-03-01 DIAGNOSIS — R2681 Unsteadiness on feet: Secondary | ICD-10-CM

## 2019-03-01 NOTE — Therapy (Signed)
[facility] [address] [place], [place], [address] Phone: [phone]   Fax:  [phone]  Physical Therapy Treatment  Patient Details  Name: Edward [name] MRN: [id number] Date of Birth: [birth year] Referring Provider (PT): [name], [place]   Encounter Date: 03/01/2019   CLINIC OPERATION CHANGES: [facility] is open at lower capacity following universal masking, social distancing, and patient screening.   PT End of Session - 03/01/19 1613    Visit Number  7    Number of Visits  17    Authorization Type  Medicaid    Authorization Time Period  3 visits 01/21/2019 - 02/10/2019; 12 visits from 02/15/19 to 03/28/2019    Authorization - Visit Number  2    Authorization - Number of Visits  12    PT Start Time  1610   pt late due to transportation   PT Stop Time  1654    PT Time Calculation (min)  44 min    Equipment Utilized During Treatment  Gait belt    Activity Tolerance  Patient tolerated treatment well;Patient limited by fatigue    Behavior During Therapy  WFL for tasks assessed/performed       Past Medical History:  Diagnosis Date  . Anxiety    self reported  . Arthritis   . CHF (congestive heart failure) ([place])   . Coronary artery disease   . Depression    self reported  . Diabetes mellitus   . Hypertension     Past Surgical History:  Procedure Laterality Date  . AMPUTATION Left 05/10/2016   Procedure: Left Transmetatarsal Amputation;  Surgeon: [name], MD;  Location: [facility];  Service: Orthopedics;  Laterality: Left;  . AMPUTATION Right 05/16/2018   Procedure: PARTIAL RAY AMPUTATION FIFTH TOE RIGHT FOOT;  Surgeon: [name], DPM;  Location: [facility];  Service: Podiatry;  Laterality: Right;  . AMPUTATION Right 06/17/2018   Procedure: AMPUTATION 4th RAY Right Foot;  Surgeon: Trula Slade, DPM;  Location: [facility];  Service: Podiatry;  Laterality: Right;  . AMPUTATION Right 06/19/2018   Procedure: RIGHT BELOW KNEE AMPUTATION;  Surgeon: [name], MD;  Location: [facility];  Service: Orthopedics;  Laterality: Right;  . APPLICATION OF WOUND VAC Right 06/19/2018   Procedure: APPLICATION OF WOUND VAC;  Surgeon: [name], MD;  Location: [facility];  Service: Orthopedics;  Laterality: Right;  . CARDIAC CATHETERIZATION  07/29/2018  . CIRCUMCISION  09/02/2011   Procedure: CIRCUMCISION ADULT;  Surgeon: Molli Hazard, MD;  Location: [facility];  Service: Urology;  Laterality: N/A;  . CORONARY STENT INTERVENTION N/A 07/29/2018   Procedure: CORONARY STENT INTERVENTION;  Surgeon: Leonie Man, MD;  Location: [facility];  Service: Cardiovascular;  Laterality: N/A;  . I&D EXTREMITY  09/02/2011   Procedure: IRRIGATION AND DEBRIDEMENT EXTREMITY;  Surgeon: Theodoro Kos, DO;  Location: [facility];  Service: Plastics;  Laterality: N/A;  . INCISION AND DRAINAGE OF WOUND  08/12/2011   Procedure: IRRIGATION AND DEBRIDEMENT WOUND;  Surgeon: Zenovia Jarred, MD;  Location: [facility];  Service: General;;  perineum  . IRRIGATION AND DEBRIDEMENT ABSCESS  08/12/2011   Procedure: IRRIGATION AND DEBRIDEMENT ABSCESS;  Surgeon: Molli Hazard, MD;  Location: [facility];  Service: Urology;  Laterality: N/A;  irrigation and debridment perineum    . IRRIGATION AND DEBRIDEMENT ABSCESS  08/14/2011   Procedure: MINOR INCISION AND DRAINAGE OF ABSCESS;  Surgeon: Quillian Quince  Julieanne Cotton, MD;  Location: [facility];  Service: Urology;  Laterality: N/A;  Perineal Wound Debridement;Placement Bilateral Testicular Thigh Pouches  . LEFT HEART CATH AND CORONARY ANGIOGRAPHY N/A 07/29/2018   Procedure: LEFT HEART CATH AND CORONARY ANGIOGRAPHY;  Surgeon: Leonie Man, MD;  Location: [facility];  Service: Cardiovascular;  Laterality: N/A;  . TOTAL HIP ARTHROPLASTY Left 10/11/2016   Procedure: LEFT TOTAL HIP ARTHROPLASTY ANTERIOR APPROACH;  Surgeon: Mcarthur Rossetti, MD;   Location: [facility];  Service: Orthopedics;  Laterality: Left;    There were no vitals filed for this visit.  Subjective Assessment - 03/01/19 1612    Subjective  No falls. Saw prosthetist who lowered the prosthesis and added pads. Feels much better now. Continue to hear air escaping liner with gait. Improved with redonning at start of session.    Pertinent History  R TTA, L THA 10/11/2016, L transmetarsal amp 2017, CAD, CHF, MI, arthritis, DM, HTN, CKD stage III    Limitations  Lifting;Walking;Standing;House hold activities    Patient Stated Goals  to use prosthesis for balance & walking.     Currently in Pain?  No/denies    Pain Score  0-No pain          [facility] Adult PT Treatment/Exercise - 03/01/19 1615      Transfers   Transfers  Sit to Stand;Stand to Sit    Sit to Stand  5: Supervision;With upper extremity assist;With armrests;From chair/3-in-1    Stand to Sit  5: Supervision;With upper extremity assist;With armrests;To chair/3-in-1;Uncontrolled descent      Ambulation/Gait   Ambulation/Gait  Yes    Ambulation/Gait Assistance  5: Supervision    Ambulation/Gait Assistance Details  use of RW into/out of session. use of cane in session. pt keeps cane on prosthetic side due to left hip weakness from hip replacement and feels better balanced this way. trialed rubber quad tip on cane today with pt planning to purchase one now.     Ambulation Distance (Feet)  115 Feet   x3 w/ cane, 115 prosthesis only, plus around gym   Assistive device  Rolling walker;Prosthesis;Straight cane    Gait Pattern  Step-through pattern;Decreased step length - left;Decreased stance time - right;Decreased stride length;Decreased hip/knee flexion - right;Decreased weight shift to right;Right hip hike;Antalgic;Lateral hip instability;Trunk flexed;Abducted- right    Ambulation Surface  Level;Indoor    Stairs  Yes    Stairs Assistance  5: Supervision    Stairs Assistance Details (indicate cue type and reason)  cues  on sequencing, technique and weight shifting.     Stair Management Technique  One rail Right;One rail Left;Step to pattern;Forwards;With cane    Number of Stairs  4   x2   Height of Stairs  6    Ramp  5: Supervision    Ramp Details (indicate cue type and reason)  with prosthesis/cane with rubber quad tip, cues on sequencing and technique      High Level Balance   High Level Balance Activities  Head turns;Negotiating over obstacles    High Level Balance Comments  with cane/prosthesis: gait in hallway with head movements left<>fwd<>right, then up<>fwd<>down. min guard to min assist for balance with cues for base of support, cane position and weight shifting; fwd stepping over bolsters of varied heights after PTA demo, cues on sequenicng and technique, min guard to min assist for balance      Prosthetics   Prosthetic Care Comments   educated on shoes as they may be  source of air being heard, has changed them since getting the prosthesis. placed foam noodle over pt's prosthetic pylon to allow his sock to stay up.     Current prosthetic wear tolerance (days/week)   daily    Current prosthetic wear tolerance (#hours/day)   all awake hours drying as needed    Residual limb condition   intact with no issues    Donning Prosthesis  Modified independent (device/increased time)    Doffing Prosthesis  Modified independent (device/increased time)           PT Short Term Goals - 02/04/19 1922      PT SHORT TERM GOAL #1   Title  Patient tolerates wear prosthesis greater than 12 hours total per day without skin integrity problems.  (All updated STGs Target Date 9th visit after PT reassessment on 01/14/2019)    Baseline  02/04/2019 Patient wearing prosthesis 8hrs /day without skin issues.    Time  3    Period  Weeks    Status  Revised      PT SHORT TERM GOAL #2   Title  patient verbalizes & demonstrates proper sweat management of prosthesis to minimize skin issues. (All updated STGs Target Date 9th  visit after PT reassessment on 01/14/2019)    Baseline  Patient requires skilled PT cues to manage sweat & skin changes from excessive.    Time  3    Period  Weeks    Status  Revised      PT SHORT TERM GOAL #3   Title  patient ambulates 82' with cane quad tip and prosthesis with modA. (All updated STGs Target Date 9th visit after PT reassessment on 01/14/2019)    Baseline  Patient is dependent on bilateral UE support on RW & PT cues.    Time  3    Period  Weeks    Status  Revised      PT SHORT TERM GOAL #4   Title  Patient transfers sit to/from stand from chairs without armrests and stabilizes without UE support / touch with supervision.  (All updated STGs Target Date 9th visit after PT reassessment on 01/14/2019)    Baseline  Patient requires support of RW to stabilize upon arising from chair with armrests.    Time  3    Period  Weeks    Status  New      PT SHORT TERM GOAL #5   Title  Berg Balance >/= 22/56  (All updated STGs Target Date 9th visit after PT reassessment on 01/14/2019)    Baseline  02/04/2019   Merrilee Jansky Balance 18/56    Time  3    Period  Weeks    Status  Revised        PT Long Term Goals - 01/14/19 2318      PT LONG TERM GOAL #1   Title  Patient is independent with all aspects of prosthetic care to enable safe utilization of the prosthesis (All LTGs Target Date 15th visit after PT reassessment on 01/14/2019)    Baseline  patient is dependent in all aspects of prosthetic care with risk of skin integrity issues     Time  9    Period  Weeks    Status  On-going      PT LONG TERM GOAL #2   Title  patient tolerates wear prosthesis daily for greater than 90% of awake hours without skin integrity problems or limb discomfort to enable function throughout his day  Baseline  Patient wearing prosthesis for last 2 weeks for only 2 hours.     Time  9    Period  Weeks    Status  On-going      PT LONG TERM GOAL #3   Title  Berg balance greater than 36 /56 to indicate lower  fall risk and less dependency in standing ADLs     Baseline  Berg balance 14/56     Time  9    Period  Weeks    Status  On-going      PT LONG TERM GOAL #4   Title  patient ambulates 500 feet with LRAD and prosthesis modified independent to enable community access     Baseline  Patient ambulates 200' with RW & prosthesis with supervision with significant gait deviations & partial weight on prosthesis.     Time  9    Period  Weeks    Status  On-going      PT LONG TERM GOAL #5   Title  patient negotiates ramps & curbs with LRAD and stairs with one rail and the prosthesis modified independent to enable community access     Baseline  Patient requires [place] instruction to negotiate ramps & curbs with RW & prosthesis.     Time  9    Period  Weeks    Status  On-going            Plan - 03/01/19 1614    Clinical Impression Statement  Today's skilled session continued to focus on gait/barriers with cane/prosthesis with no significant issues noted. Trialed one lap with prosthesis only with up to min assist for balance. Advised pt to use cane or RW at this time. The pt is progressing well toward goals and should benefit from continued PT to progress toward unmet goals.    Personal Factors and Comorbidities  Comorbidity 3+;Fitness;Time since onset of injury/illness/exacerbation    Comorbidities  R TTA, L THA 10/11/2016, L transmetarsal amp 2017, CAD, CHF, MI, arthritis, DM2, HTN, CKD stage III    Examination-Activity Limitations  Caring for Others;Carry;Locomotion Level;Reach Overhead;Stairs;Stand;Transfers    Examination-Participation Restrictions  Community Activity;Driving;Personal Finances    Stability/Clinical Decision Making  Evolving/Moderate complexity    Rehab Potential  Good    PT Frequency  2x / week   1x/wk for 3 weeks, then 2x/wk for 6 weeks   PT Duration  Other (comment)   9 weeks   PT Treatment/Interventions  ADLs/Self Care Home Management;DME Instruction;Gait  training;Stair training;Functional mobility training;Canalith Repostioning;Therapeutic activities;Therapeutic exercise;Balance training;Neuromuscular re-education;Patient/family education;Prosthetic Training;Manual techniques;Vestibular    PT Next Visit Plan  continue with cane/prosthesis with gait/barriers, balance activities    PT Home Exercise Plan  Access Code: [email or web address] & Amputee sink HEP    Consulted and Agree with Plan of Care  Patient       Patient will benefit from skilled therapeutic intervention in order to improve the following deficits and impairments:  Abnormal gait, Decreased activity tolerance, Decreased balance, Decreased endurance, Decreased knowledge of use of DME, Decreased mobility, Decreased skin integrity, Decreased strength, Prosthetic Dependency, Postural dysfunction, Pain  Visit Diagnosis: 1. Other abnormalities of gait and mobility   2. Unsteadiness on feet   3. Muscle weakness (generalized)        Problem List Patient Active Problem List   Diagnosis Date Noted  . Elevated troponin 12/22/2018  . CHF (congestive heart failure) ([place]) 12/22/2018  . Acute exacerbation of CHF (congestive heart failure) ([facility]) 12/21/2018  .  Gait abnormality 09/11/2018  . CAD S/P percutaneous coronary angioplasty 08/06/2018  . Cardiomyopathy ([place]) 08/06/2018  . PVD (peripheral vascular disease) ([facility]) 08/06/2018  . Shortness of breath   . Acute on chronic combined systolic and diastolic CHF, NYHA class 2 ([place])   . Non-ST elevation (NSTEMI) myocardial infarction ([facility]) 07/28/2018  . Respiratory failure with hypoxia ([place]) 07/28/2018  . Acute heart failure ([place]) 07/28/2018  . Hypertensive emergency 07/28/2018  . S/P BKA (below knee amputation) unilateral, right ([facility]) 07/15/2018  . Hypoglycemia   . Anxiety about health   . Labile blood glucose   . Essential hypertension   . History of transmetatarsal amputation of left foot ([place])   . Hypoalbuminemia due to protein-calorie  malnutrition ([facility])   . Stage 3 chronic kidney disease ([place])   . Poorly controlled type 2 diabetes mellitus with peripheral neuropathy ([place])   . Acute blood loss anemia   . Anemia of chronic disease   . Leukocytosis   . Tobacco abuse   . Hyperkalemia 06/20/2018  . Diabetic polyneuropathy associated with type 2 diabetes mellitus ([place])   . Severe protein-calorie malnutrition ([place])   . AKI (acute kidney injury) ([facility]) 06/17/2018  . Normocytic anemia 06/17/2018  . DM2 (diabetes mellitus, type 2) ([place]) 05/14/2018  . Viral pharyngitis 01/14/2017  . Cigarette smoker 01/14/2017  . Avascular necrosis of hip, left ([place]) 10/11/2016  . Status post left hip replacement 10/11/2016  . S/P transmetatarsal amputation of foot, left ([place]) 07/25/2016  . Balance problem 07/25/2016  . Fournier's gangrene 08/16/2011  . Acute respiratory failure ([place]) 08/16/2011  . Septic shock(785.52) 08/16/2011  . Insulin dependent diabetes mellitus with complications ([place]) 35/45/6256    Willow Ora, PTA, [facility] [address], [place], [place] [address] [phone] 03/01/19, 5:24 PM   Name: Edward [name] MRN: [id number] Date of Birth: 1971-07-31

## 2019-03-04 ENCOUNTER — Ambulatory Visit: Payer: Medicaid Other | Admitting: Physical Therapy

## 2019-03-08 ENCOUNTER — Encounter: Payer: Medicaid Other | Admitting: Physical Therapy

## 2019-03-09 ENCOUNTER — Ambulatory Visit: Payer: Medicaid Other | Admitting: Physical Therapy

## 2019-03-09 ENCOUNTER — Other Ambulatory Visit: Payer: Self-pay

## 2019-03-09 ENCOUNTER — Encounter: Payer: Self-pay | Admitting: Physical Therapy

## 2019-03-09 DIAGNOSIS — R2689 Other abnormalities of gait and mobility: Secondary | ICD-10-CM | POA: Diagnosis not present

## 2019-03-09 DIAGNOSIS — R293 Abnormal posture: Secondary | ICD-10-CM

## 2019-03-09 DIAGNOSIS — M6281 Muscle weakness (generalized): Secondary | ICD-10-CM

## 2019-03-09 DIAGNOSIS — R2681 Unsteadiness on feet: Secondary | ICD-10-CM

## 2019-03-10 NOTE — Therapy (Signed)
[facility] [address] [place], [place], [address] Phone: [phone]   Fax:  [phone]  Physical Therapy Treatment  Patient Details  Name: Edward [name] MRN: [id number] Date of Birth: 1971-07-03 Referring Provider (PT): [name], [place]   Encounter Date: 03/09/2019   CLINIC OPERATION CHANGES: [facility] is open at lower capacity following universal masking, social distancing, and patient screening.   PT End of Session - 03/09/19 1713    Visit Number  8    Number of Visits  17    Authorization Type  Medicaid    Authorization Time Period  3 visits 01/21/2019 - 02/10/2019; 12 visits from 02/15/19 to 03/28/2019    Authorization - Visit Number  3    Authorization - Number of Visits  12    PT Start Time  6962   pt late due to transportation   PT Stop Time  1743    PT Time Calculation (min)  32 min    Equipment Utilized During Treatment  Gait belt    Activity Tolerance  Patient tolerated treatment well;Patient limited by fatigue    Behavior During Therapy  WFL for tasks assessed/performed       Past Medical History:  Diagnosis Date  . Anxiety    self reported  . Arthritis   . CHF (congestive heart failure) ([place])   . Coronary artery disease   . Depression    self reported  . Diabetes mellitus   . Hypertension     Past Surgical History:  Procedure Laterality Date  . AMPUTATION Left 05/10/2016   Procedure: Left Transmetatarsal Amputation;  Surgeon: [name], MD;  Location: [facility];  Service: Orthopedics;  Laterality: Left;  . AMPUTATION Right 05/16/2018   Procedure: PARTIAL RAY AMPUTATION FIFTH TOE RIGHT FOOT;  Surgeon: [name], DPM;  Location: [facility];  Service: Podiatry;  Laterality: Right;  . AMPUTATION Right 06/17/2018   Procedure: AMPUTATION 4th RAY Right Foot;  Surgeon: Trula Slade, DPM;  Location: [facility];  Service: Podiatry;  Laterality: Right;  . AMPUTATION Right 06/19/2018   Procedure: RIGHT BELOW KNEE AMPUTATION;  Surgeon: [name], MD;  Location: [facility];  Service: Orthopedics;  Laterality: Right;  . APPLICATION OF WOUND VAC Right 06/19/2018   Procedure: APPLICATION OF WOUND VAC;  Surgeon: [name], MD;  Location: [facility];  Service: Orthopedics;  Laterality: Right;  . CARDIAC CATHETERIZATION  07/29/2018  . CIRCUMCISION  09/02/2011   Procedure: CIRCUMCISION ADULT;  Surgeon: Molli Hazard, MD;  Location: [facility];  Service: Urology;  Laterality: N/A;  . CORONARY STENT INTERVENTION N/A 07/29/2018   Procedure: CORONARY STENT INTERVENTION;  Surgeon: Leonie Man, MD;  Location: [facility];  Service: Cardiovascular;  Laterality: N/A;  . I&D EXTREMITY  09/02/2011   Procedure: IRRIGATION AND DEBRIDEMENT EXTREMITY;  Surgeon: Theodoro Kos, DO;  Location: [facility];  Service: Plastics;  Laterality: N/A;  . INCISION AND DRAINAGE OF WOUND  08/12/2011   Procedure: IRRIGATION AND DEBRIDEMENT WOUND;  Surgeon: Zenovia Jarred, MD;  Location: [facility];  Service: General;;  perineum  . IRRIGATION AND DEBRIDEMENT ABSCESS  08/12/2011   Procedure: IRRIGATION AND DEBRIDEMENT ABSCESS;  Surgeon: Molli Hazard, MD;  Location: [facility];  Service: Urology;  Laterality: N/A;  irrigation and debridment perineum    . IRRIGATION AND DEBRIDEMENT ABSCESS  08/14/2011   Procedure: MINOR INCISION AND DRAINAGE OF ABSCESS;  Surgeon: Quillian Quince  Julieanne Cotton, MD;  Location: [facility];  Service: Urology;  Laterality: N/A;  Perineal Wound Debridement;Placement Bilateral Testicular Thigh Pouches  . LEFT HEART CATH AND CORONARY ANGIOGRAPHY N/A 07/29/2018   Procedure: LEFT HEART CATH AND CORONARY ANGIOGRAPHY;  Surgeon: Leonie Man, MD;  Location: [facility];  Service: Cardiovascular;  Laterality: N/A;  . TOTAL HIP ARTHROPLASTY Left 10/11/2016   Procedure: LEFT TOTAL HIP ARTHROPLASTY ANTERIOR APPROACH;  Surgeon: Mcarthur Rossetti, MD;   Location: [facility];  Service: Orthopedics;  Laterality: Left;    There were no vitals filed for this visit.  Subjective Assessment - 03/09/19 1712    Subjective  No new complaints. No falls or pain to report.    Pertinent History  R TTA, L THA 10/11/2016, L transmetarsal amp 2017, CAD, CHF, MI, arthritis, DM, HTN, CKD stage III    Limitations  Lifting;Walking;Standing;House hold activities    Patient Stated Goals  to use prosthesis for balance & walking.     Currently in Pain?  No/denies    Pain Score  0-No pain          [facility] Adult PT Treatment/Exercise - 03/09/19 1714      Transfers   Transfers  Sit to Stand;Stand to Sit    Sit to Stand  5: Supervision;With upper extremity assist;With armrests;From chair/3-in-1    Stand to Sit  5: Supervision;With upper extremity assist;With armrests;To chair/3-in-1;Uncontrolled descent      Ambulation/Gait   Ambulation/Gait  Yes    Ambulation/Gait Assistance  5: Supervision;4: Min guard    Ambulation/Gait Assistance Details  use of RW into/out of session, use of cane in session.  min guard assist with cane today as pt with mild unsteadiness.     Ambulation Distance (Feet)  125 Feet   x1, plus around gym    Assistive device  Rolling walker;Prosthesis;Straight cane    Gait Pattern  Step-through pattern;Decreased step length - left;Decreased stance time - right;Decreased stride length;Decreased hip/knee flexion - right;Decreased weight shift to right;Right hip hike;Antalgic;Lateral hip instability;Trunk flexed;Abducted- right    Ambulation Surface  Level;Indoor    Stairs  Yes    Stairs Assistance  5: Supervision    Stairs Assistance Details (indicate cue type and reason)  reminder cues on sequencing and technique    Stair Management Technique  One rail Right;One rail Left;Step to pattern;Forwards;With cane    Number of Stairs  4   x 2 reps   Height of Stairs  6      Neuro Re-ed    Neuro Re-ed Details   for balance/muscle re-ed: on 2 pillows in  corner: feet hip width apart for EC no head movements, then EO for head movements left<>right, then up<>down, min assist for balance ; balance board both ways- holding board steady for alternating arm raises, then bil UE raises for 10 reps each, min assist for balance. cues needed for posture and weight shifting for balance.        Prosthetics   Prosthetic Care Comments   changed shoes, howvever still hearing air escape with gait, not as audible as it has been. pt dried limb and redonned liner. still with audible air escaping with gait afterwards.     Current prosthetic wear tolerance (days/week)   daily    Current prosthetic wear tolerance (#hours/day)   all awake hours drying as needed    Residual limb condition   intact with no issues    Donning Prosthesis  Modified independent (device/increased time)  Doffing Prosthesis  Modified independent (device/increased time)          PT Short Term Goals - 02/04/19 1922      PT SHORT TERM GOAL #1   Title  Patient tolerates wear prosthesis greater than 12 hours total per day without skin integrity problems.  (All updated STGs Target Date 9th visit after PT reassessment on 01/14/2019)    Baseline  02/04/2019 Patient wearing prosthesis 8hrs /day without skin issues.    Time  3    Period  Weeks    Status  Revised      PT SHORT TERM GOAL #2   Title  patient verbalizes & demonstrates proper sweat management of prosthesis to minimize skin issues. (All updated STGs Target Date 9th visit after PT reassessment on 01/14/2019)    Baseline  Patient requires skilled PT cues to manage sweat & skin changes from excessive.    Time  3    Period  Weeks    Status  Revised      PT SHORT TERM GOAL #3   Title  patient ambulates 65' with cane quad tip and prosthesis with modA. (All updated STGs Target Date 9th visit after PT reassessment on 01/14/2019)    Baseline  Patient is dependent on bilateral UE support on RW & PT cues.    Time  3    Period  Weeks     Status  Revised      PT SHORT TERM GOAL #4   Title  Patient transfers sit to/from stand from chairs without armrests and stabilizes without UE support / touch with supervision.  (All updated STGs Target Date 9th visit after PT reassessment on 01/14/2019)    Baseline  Patient requires support of RW to stabilize upon arising from chair with armrests.    Time  3    Period  Weeks    Status  New      PT SHORT TERM GOAL #5   Title  Berg Balance >/= 22/56  (All updated STGs Target Date 9th visit after PT reassessment on 01/14/2019)    Baseline  02/04/2019   Merrilee Jansky Balance 18/56    Time  3    Period  Weeks    Status  Revised        PT Long Term Goals - 01/14/19 2318      PT LONG TERM GOAL #1   Title  Patient is independent with all aspects of prosthetic care to enable safe utilization of the prosthesis (All LTGs Target Date 15th visit after PT reassessment on 01/14/2019)    Baseline  patient is dependent in all aspects of prosthetic care with risk of skin integrity issues     Time  9    Period  Weeks    Status  On-going      PT LONG TERM GOAL #2   Title  patient tolerates wear prosthesis daily for greater than 90% of awake hours without skin integrity problems or limb discomfort to enable function throughout his day     Baseline  Patient wearing prosthesis for last 2 weeks for only 2 hours.     Time  9    Period  Weeks    Status  On-going      PT LONG TERM GOAL #3   Title  Berg balance greater than 36 /56 to indicate lower fall risk and less dependency in standing ADLs     Baseline  Berg balance 14/56     Time  9    Period  Weeks    Status  On-going      PT LONG TERM GOAL #4   Title  patient ambulates 500 feet with LRAD and prosthesis modified independent to enable community access     Baseline  Patient ambulates 200' with RW & prosthesis with supervision with significant gait deviations & partial weight on prosthesis.     Time  9    Period  Weeks    Status  On-going      PT LONG  TERM GOAL #5   Title  patient negotiates ramps & curbs with LRAD and stairs with one rail and the prosthesis modified independent to enable community access     Baseline  Patient requires [place] instruction to negotiate ramps & curbs with RW & prosthesis.     Time  9    Period  Weeks    Status  On-going            Plan - 03/09/19 1713    Clinical Impression Statement  Today's skilled session continued to focus on use of cane with gait/barriers and balance reactions. No issues noted other than fatigue, rest breaks taken with session. The pt is progressing toward goals and should benefit from continued PT to progress toward unmet goals.    Personal Factors and Comorbidities  Comorbidity 3+;Fitness;Time since onset of injury/illness/exacerbation    Comorbidities  R TTA, L THA 10/11/2016, L transmetarsal amp 2017, CAD, CHF, MI, arthritis, DM2, HTN, CKD stage III    Examination-Activity Limitations  Caring for Others;Carry;Locomotion Level;Reach Overhead;Stairs;Stand;Transfers    Examination-Participation Restrictions  Community Activity;Driving;Personal Finances    Stability/Clinical Decision Making  Evolving/Moderate complexity    Rehab Potential  Good    PT Frequency  2x / week   1x/wk for 3 weeks, then 2x/wk for 6 weeks   PT Duration  Other (comment)   9 weeks   PT Treatment/Interventions  ADLs/Self Care Home Management;DME Instruction;Gait training;Stair training;Functional mobility training;Canalith Repostioning;Therapeutic activities;Therapeutic exercise;Balance training;Neuromuscular re-education;Patient/family education;Prosthetic Training;Manual techniques;Vestibular    PT Next Visit Plan  continue with cane/prosthesis with gait/barriers, balance activities    PT Home Exercise Plan  Access Code: [email or web address] & Amputee sink HEP    Consulted and Agree with Plan of Care  Patient       Patient will benefit from skilled therapeutic intervention in order to improve the following  deficits and impairments:  Abnormal gait, Decreased activity tolerance, Decreased balance, Decreased endurance, Decreased knowledge of use of DME, Decreased mobility, Decreased skin integrity, Decreased strength, Prosthetic Dependency, Postural dysfunction, Pain  Visit Diagnosis: 1. Unsteadiness on feet   2. Other abnormalities of gait and mobility   3. Muscle weakness (generalized)   4. Abnormal posture        Problem List Patient Active Problem List   Diagnosis Date Noted  . Elevated troponin 12/22/2018  . CHF (congestive heart failure) ([facility]) 12/22/2018  . Acute exacerbation of CHF (congestive heart failure) ([facility]) 12/21/2018  . Gait abnormality 09/11/2018  . CAD S/P percutaneous coronary angioplasty 08/06/2018  . Cardiomyopathy ([facility] [place]) 08/06/2018  . PVD (peripheral vascular disease) ([facility]) 08/06/2018  . Shortness of breath   . Acute on chronic combined systolic and diastolic CHF, NYHA class 2 ([place])   . Non-ST elevation (NSTEMI) myocardial infarction ([place]) 07/28/2018  . Respiratory failure with hypoxia ([facility]) 07/28/2018  . Acute heart failure ([place]) 07/28/2018  . Hypertensive emergency 07/28/2018  . S/P BKA (below knee amputation) unilateral, right ([place]) 07/15/2018  .  Hypoglycemia   . Anxiety about health   . Labile blood glucose   . Essential hypertension   . History of transmetatarsal amputation of left foot ([place])   . Hypoalbuminemia due to protein-calorie malnutrition ([facility])   . Stage 3 chronic kidney disease ([place])   . Poorly controlled type 2 diabetes mellitus with peripheral neuropathy ([place])   . Acute blood loss anemia   . Anemia of chronic disease   . Leukocytosis   . Tobacco abuse   . Hyperkalemia 06/20/2018  . Diabetic polyneuropathy associated with type 2 diabetes mellitus ([place])   . Severe protein-calorie malnutrition ([place])   . AKI (acute kidney injury) ([facility]) 06/17/2018  . Normocytic anemia 06/17/2018  . DM2 (diabetes mellitus, type 2) ([facility]) 05/14/2018  . Viral  pharyngitis 01/14/2017  . Cigarette smoker 01/14/2017  . Avascular necrosis of hip, left ([place]) 10/11/2016  . Status post left hip replacement 10/11/2016  . S/P transmetatarsal amputation of foot, left ([facility]) 07/25/2016  . Balance problem 07/25/2016  . Fournier's gangrene 08/16/2011  . Acute respiratory failure ([place]) 08/16/2011  . Septic shock(785.52) 08/16/2011  . Insulin dependent diabetes mellitus with complications ([place]) 26/41/5830    Willow Ora, PTA, [facility] [address] [place], [place] [address] [phone] 03/10/19, 2:00 PM   Name: Edward [name] MRN: [id number] Date of Birth: Jun 22, 1971

## 2019-03-11 ENCOUNTER — Encounter: Payer: Self-pay | Admitting: Physical Therapy

## 2019-03-11 ENCOUNTER — Other Ambulatory Visit: Payer: Self-pay

## 2019-03-11 ENCOUNTER — Ambulatory Visit: Payer: Medicaid Other | Admitting: Physical Therapy

## 2019-03-11 DIAGNOSIS — R2689 Other abnormalities of gait and mobility: Secondary | ICD-10-CM | POA: Diagnosis not present

## 2019-03-11 DIAGNOSIS — M6281 Muscle weakness (generalized): Secondary | ICD-10-CM

## 2019-03-11 DIAGNOSIS — R2681 Unsteadiness on feet: Secondary | ICD-10-CM

## 2019-03-11 DIAGNOSIS — R293 Abnormal posture: Secondary | ICD-10-CM

## 2019-03-11 DIAGNOSIS — Z9181 History of falling: Secondary | ICD-10-CM

## 2019-03-11 NOTE — Therapy (Signed)
[facility] [address] [place], [place], [address] Phone: [phone]   Fax:  [phone]  Physical Therapy Treatment  Patient Details  Name: Edward [name] MRN: [id number] Date of Birth: 04/08/1971 Referring Provider (PT): [name], [place]   Encounter Date: 03/11/2019   CLINIC OPERATION CHANGES: [facility] is open at lower capacity following universal masking, social distancing, and patient screening.  The patient's COVID risk of complications score is 6.   PT End of Session - 03/11/19 1749    Visit Number  9    Number of Visits  17    Authorization Type  Medicaid    Authorization Time Period  3 visits 01/21/2019 - 02/10/2019; 12 visits from 02/15/19 to 03/28/2019    Authorization - Visit Number  4    Authorization - Number of Visits  12    PT Start Time  2671    PT Stop Time  2458    PT Time Calculation (min)  44 min    Equipment Utilized During Treatment  Gait belt    Activity Tolerance  Patient tolerated treatment well;Patient limited by fatigue    Behavior During Therapy  WFL for tasks assessed/performed       Past Medical History:  Diagnosis Date  . Anxiety    self reported  . Arthritis   . CHF (congestive heart failure) ([place])   . Coronary artery disease   . Depression    self reported  . Diabetes mellitus   . Hypertension     Past Surgical History:  Procedure Laterality Date  . AMPUTATION Left 05/10/2016   Procedure: Left Transmetatarsal Amputation;  Surgeon: [name], MD;  Location: [facility];  Service: Orthopedics;  Laterality: Left;  . AMPUTATION Right 05/16/2018   Procedure: PARTIAL RAY AMPUTATION FIFTH TOE RIGHT FOOT;  Surgeon: [name], DPM;  Location: [facility];  Service: Podiatry;  Laterality: Right;  . AMPUTATION Right 06/17/2018   Procedure: AMPUTATION 4th RAY Right Foot;  Surgeon: Trula Slade, DPM;  Location: [facility];  Service: Podiatry;  Laterality: Right;  .  AMPUTATION Right 06/19/2018   Procedure: RIGHT BELOW KNEE AMPUTATION;  Surgeon: [name], MD;  Location: [facility];  Service: Orthopedics;  Laterality: Right;  . APPLICATION OF WOUND VAC Right 06/19/2018   Procedure: APPLICATION OF WOUND VAC;  Surgeon: [name], MD;  Location: [facility];  Service: Orthopedics;  Laterality: Right;  . CARDIAC CATHETERIZATION  07/29/2018  . CIRCUMCISION  09/02/2011   Procedure: CIRCUMCISION ADULT;  Surgeon: Molli Hazard, MD;  Location: [facility];  Service: Urology;  Laterality: N/A;  . CORONARY STENT INTERVENTION N/A 07/29/2018   Procedure: CORONARY STENT INTERVENTION;  Surgeon: Leonie Man, MD;  Location: [facility];  Service: Cardiovascular;  Laterality: N/A;  . I&D EXTREMITY  09/02/2011   Procedure: IRRIGATION AND DEBRIDEMENT EXTREMITY;  Surgeon: Theodoro Kos, DO;  Location: [facility];  Service: Plastics;  Laterality: N/A;  . INCISION AND DRAINAGE OF WOUND  08/12/2011   Procedure: IRRIGATION AND DEBRIDEMENT WOUND;  Surgeon: Zenovia Jarred, MD;  Location: [facility];  Service: General;;  perineum  . IRRIGATION AND DEBRIDEMENT ABSCESS  08/12/2011   Procedure: IRRIGATION AND DEBRIDEMENT ABSCESS;  Surgeon: Molli Hazard, MD;  Location: [facility];  Service: Urology;  Laterality: N/A;  irrigation and debridment perineum    . IRRIGATION AND DEBRIDEMENT ABSCESS  08/14/2011   Procedure: MINOR INCISION AND DRAINAGE  OF ABSCESS;  Surgeon: Molli Hazard, MD;  Location: [facility];  Service: Urology;  Laterality: N/A;  Perineal Wound Debridement;Placement Bilateral Testicular Thigh Pouches  . LEFT HEART CATH AND CORONARY ANGIOGRAPHY N/A 07/29/2018   Procedure: LEFT HEART CATH AND CORONARY ANGIOGRAPHY;  Surgeon: Leonie Man, MD;  Location: [facility];  Service: Cardiovascular;  Laterality: N/A;  . TOTAL HIP ARTHROPLASTY Left 10/11/2016   Procedure: LEFT TOTAL HIP ARTHROPLASTY ANTERIOR APPROACH;  Surgeon:  Mcarthur Rossetti, MD;  Location: [facility];  Service: Orthopedics;  Laterality: Left;    There were no vitals filed for this visit.  Subjective Assessment - 03/11/19 1700    Subjective  He has been wearing the prosthesis 8-10hrs /day. He is drying limb/liner is sweating but leaves it off to air dry.    Pertinent History  R TTA, L THA 10/11/2016, L transmetarsal amp 2017, CAD, CHF, MI, arthritis, DM, HTN, CKD stage III    Limitations  Lifting;Walking;Standing;House hold activities    Patient Stated Goals  to use prosthesis for balance & walking.     Currently in Pain?  No/denies                       [facility] Adult PT Treatment/Exercise - 03/11/19 1700      Transfers   Transfers  Sit to Stand;Stand to Sit    Sit to Stand  5: Supervision;With upper extremity assist;With armrests;From chair/3-in-1   working on not needing external support to stabilize   Sit to Stand Details  Tactile cues for weight shifting;Visual cues for safe use of DME/AE;Verbal cues for sequencing;Verbal cues for technique    Sit to Stand Details (indicate cue type and reason)  working on standing from chairs with & without armrests with counter close but goal not to touch    Stand to Sit  5: Supervision;With upper extremity assist;With armrests;To chair/3-in-1   chairs with & without armrests   Stand to Sit Details (indicate cue type and reason)  Tactile cues for sequencing;Tactile cues for placement;Visual cues for safe use of DME/AE;Verbal cues for sequencing;Verbal cues for technique;Verbal cues for safe use of DME/AE    Stand to Sit Details  working on standing from chairs with & without armrests with counter close but goal not to touch      Ambulation/Gait   Ambulation/Gait  Yes    Ambulation/Gait Assistance  4: Min guard;5: Supervision;2: Max assist   caught prosthetic toe requiring maxA to prevent fall   Ambulation/Gait Assistance Details  upright posture & wt shift. Tactile & verbal cues on  maintaining path with head turns to scan right/left, up/down & diagonals.     Ambulation Distance (Feet)  200 Feet   130', 200' x 2   Assistive device  Straight cane;Prosthesis   cane quad tip   Gait Pattern  Step-through pattern;Decreased step length - left;Decreased stance time - right;Decreased stride length;Decreased hip/knee flexion - right;Decreased weight shift to right;Right hip hike;Antalgic;Lateral hip instability;Trunk flexed;Abducted- right    Ambulation Surface  Indoor;Level      High Level Balance   High Level Balance Activities  Side stepping;Backward walking;Turns;Head turns    High Level Balance Comments  demo & verbal cues on technique with TTA prosthesis & cane.       Prosthetics   Prosthetic Care Comments   assessing liner for slippage with sweating. Not letting limb air dry to facilitate accomodation of limb to heat of environment.  Current prosthetic wear tolerance (days/week)   daily    Current prosthetic wear tolerance (#hours/day)   8-10 hours    Residual limb condition   intact no issues. gray cloudy skin from over moisture.     Education Provided  Skin check;Residual limb care;Proper Donning;Proper wear schedule/adjustment    Person(s) Educated  Patient    Education Method  Explanation;Demonstration;Verbal cues;Tactile cues    Education Method  Verbalized understanding;Returned demonstration;Tactile cues required;Verbal cues required;Needs further instruction               PT Short Term Goals - 02/04/19 1922      PT SHORT TERM GOAL #1   Title  Patient tolerates wear prosthesis greater than 12 hours total per day without skin integrity problems.  (All updated STGs Target Date 9th visit after PT reassessment on 01/14/2019)    Baseline  02/04/2019 Patient wearing prosthesis 8hrs /day without skin issues.    Time  3    Period  Weeks    Status  Revised      PT SHORT TERM GOAL #2   Title  patient verbalizes & demonstrates proper sweat management of  prosthesis to minimize skin issues. (All updated STGs Target Date 9th visit after PT reassessment on 01/14/2019)    Baseline  Patient requires skilled PT cues to manage sweat & skin changes from excessive.    Time  3    Period  Weeks    Status  Revised      PT SHORT TERM GOAL #3   Title  patient ambulates 67' with cane quad tip and prosthesis with modA. (All updated STGs Target Date 9th visit after PT reassessment on 01/14/2019)    Baseline  Patient is dependent on bilateral UE support on RW & PT cues.    Time  3    Period  Weeks    Status  Revised      PT SHORT TERM GOAL #4   Title  Patient transfers sit to/from stand from chairs without armrests and stabilizes without UE support / touch with supervision.  (All updated STGs Target Date 9th visit after PT reassessment on 01/14/2019)    Baseline  Patient requires support of RW to stabilize upon arising from chair with armrests.    Time  3    Period  Weeks    Status  New      PT SHORT TERM GOAL #5   Title  Berg Balance >/= 22/56  (All updated STGs Target Date 9th visit after PT reassessment on 01/14/2019)    Baseline  02/04/2019   Merrilee Jansky Balance 18/56    Time  3    Period  Weeks    Status  Revised        PT Long Term Goals - 01/14/19 2318      PT LONG TERM GOAL #1   Title  Patient is independent with all aspects of prosthetic care to enable safe utilization of the prosthesis (All LTGs Target Date 15th visit after PT reassessment on 01/14/2019)    Baseline  patient is dependent in all aspects of prosthetic care with risk of skin integrity issues     Time  9    Period  Weeks    Status  On-going      PT LONG TERM GOAL #2   Title  patient tolerates wear prosthesis daily for greater than 90% of awake hours without skin integrity problems or limb discomfort to enable function throughout his day  Baseline  Patient wearing prosthesis for last 2 weeks for only 2 hours.     Time  9    Period  Weeks    Status  On-going      PT LONG TERM  GOAL #3   Title  Berg balance greater than 36 /56 to indicate lower fall risk and less dependency in standing ADLs     Baseline  Berg balance 14/56     Time  9    Period  Weeks    Status  On-going      PT LONG TERM GOAL #4   Title  patient ambulates 500 feet with LRAD and prosthesis modified independent to enable community access     Baseline  Patient ambulates 200' with RW & prosthesis with supervision with significant gait deviations & partial weight on prosthesis.     Time  9    Period  Weeks    Status  On-going      PT LONG TERM GOAL #5   Title  patient negotiates ramps & curbs with LRAD and stairs with one rail and the prosthesis modified independent to enable community access     Baseline  Patient requires [place] instruction to negotiate ramps & curbs with RW & prosthesis.     Time  9    Period  Weeks    Status  On-going            Plan - 03/11/19 2218    Clinical Impression Statement  Today's session focused on gait with head turns to scan maintaining path & balance, side stepping, backward stepping & changing directions with proper movement patterns & wt shifts.  Also worked on sit/stand without external support to stabilize and progressing to chairs without armrests.    Personal Factors and Comorbidities  Comorbidity 3+;Fitness;Time since onset of injury/illness/exacerbation    Comorbidities  R TTA, L THA 10/11/2016, L transmetarsal amp 2017, CAD, CHF, MI, arthritis, DM2, HTN, CKD stage III    Examination-Activity Limitations  Caring for Others;Carry;Locomotion Level;Reach Overhead;Stairs;Stand;Transfers    Examination-Participation Restrictions  Community Activity;Driving;Personal Finances    Stability/Clinical Decision Making  Evolving/Moderate complexity    Rehab Potential  Good    PT Frequency  2x / week   1x/wk for 3 weeks, then 2x/wk for 6 weeks   PT Duration  Other (comment)   9 weeks   PT Treatment/Interventions  ADLs/Self Care Home Management;DME  Instruction;Gait training;Stair training;Functional mobility training;Canalith Repostioning;Therapeutic activities;Therapeutic exercise;Balance training;Neuromuscular re-education;Patient/family education;Prosthetic Training;Manual techniques;Vestibular    PT Next Visit Plan  continue with cane/prosthesis with gait/barriers, balance activities    PT Home Exercise Plan  Access Code: [email or web address] & Amputee sink HEP    Consulted and Agree with Plan of Care  Patient       Patient will benefit from skilled therapeutic intervention in order to improve the following deficits and impairments:  Abnormal gait, Decreased activity tolerance, Decreased balance, Decreased endurance, Decreased knowledge of use of DME, Decreased mobility, Decreased skin integrity, Decreased strength, Prosthetic Dependency, Postural dysfunction, Pain  Visit Diagnosis: 1. Unsteadiness on feet   2. Other abnormalities of gait and mobility   3. Muscle weakness (generalized)   4. Abnormal posture   5. History of falling        Problem List Patient Active Problem List   Diagnosis Date Noted  . Elevated troponin 12/22/2018  . CHF (congestive heart failure) ([facility]) 12/22/2018  . Acute exacerbation of CHF (congestive heart failure) ([facility]) 12/21/2018  .  Gait abnormality 09/11/2018  . CAD S/P percutaneous coronary angioplasty 08/06/2018  . Cardiomyopathy ([facility]) 08/06/2018  . PVD (peripheral vascular disease) ([facility] [place]) 08/06/2018  . Shortness of breath   . Acute on chronic combined systolic and diastolic CHF, NYHA class 2 ([facility])   . Non-ST elevation (NSTEMI) myocardial infarction ([facility]) 07/28/2018  . Respiratory failure with hypoxia ([facility]) 07/28/2018  . Acute heart failure ([facility]) 07/28/2018  . Hypertensive emergency 07/28/2018  . S/P BKA (below knee amputation) unilateral, right ([facility]) 07/15/2018  . Hypoglycemia   . Anxiety about health   . Labile blood glucose   . Essential hypertension   . History of transmetatarsal amputation of left  foot ([facility])   . Hypoalbuminemia due to protein-calorie malnutrition ([place] [facility])   . Stage 3 chronic kidney disease ([facility])   . Poorly controlled type 2 diabetes mellitus with peripheral neuropathy ([facility])   . Acute blood loss anemia   . Anemia of chronic disease   . Leukocytosis   . Tobacco abuse   . Hyperkalemia 06/20/2018  . Diabetic polyneuropathy associated with type 2 diabetes mellitus ([facility])   . Severe protein-calorie malnutrition ([facility])   . AKI (acute kidney injury) ([facility]) 06/17/2018  . Normocytic anemia 06/17/2018  . DM2 (diabetes mellitus, type 2) ([facility]) 05/14/2018  . Viral pharyngitis 01/14/2017  . Cigarette smoker 01/14/2017  . Avascular necrosis of hip, left ([place]) 10/11/2016  . Status post left hip replacement 10/11/2016  . S/P transmetatarsal amputation of foot, left ([facility]) 07/25/2016  . Balance problem 07/25/2016  . Fournier's gangrene 08/16/2011  . Acute respiratory failure ([facility]) 08/16/2011  . Septic shock(785.52) 08/16/2011  . Insulin dependent diabetes mellitus with complications ([facility]) 31/54/0086    Artem Bunte PT, DPT 03/11/2019, 10:20 PM  [facility] [address] [place], [place], [address] Phone: [phone]   Fax:  [phone]  Name: Edward [name] MRN: [id number] Date of Birth: 1971-07-19

## 2019-03-15 NOTE — Progress Notes (Signed)
HPI: Follow-up coronary artery disease.  Patient admitted December 2019 with CHF and non-ST elevation myocardial infarction.  Cardiac catheterization revealed an 85% circumflex, 90% mid to distal LAD (distal LAD small vessel and not amenable to PCI) and 75% first marginal.  Patient had PCI of his left circumflex with drug-eluting stent.  Echocardiogram May 2020 showed ejection fraction 45 to 50%, moderate diastolic dysfunction, severe left atrial enlargement, moderate mitral regurgitation and mild aortic insufficiency.  Recently had Lasix increased because of worsening CHF.  Since last seen, he continues to have edema left lower extremity but denies dyspnea, chest pain, palpitations or syncope.  Current Outpatient Medications  Medication Sig Dispense Refill  . acetaminophen (TYLENOL) 325 MG tablet Take 1-2 tablets (325-650 mg total) by mouth every 6 (six) hours as needed for mild pain (pain score 1-3 or temp > 100.5).    Marland Kitchen ALPRAZolam (XANAX) 0.25 MG tablet Take 1 tablet (0.25 mg total) by mouth 3 (three) times daily as needed for anxiety. 30 tablet 0  . aspirin EC 81 MG EC tablet Take 1 tablet (81 mg total) by mouth daily. 90 tablet 3  . atorvastatin (LIPITOR) 80 MG tablet Take 1 tablet (80 mg total) by mouth daily at 6 PM. 90 tablet 3  . blood glucose meter kit and supplies Dispense based on patient and insurance preference. Use up to four times daily as directed. (FOR ICD-9 250.00, 250.01). 1 each 0  . DULoxetine (CYMBALTA) 30 MG capsule Take 1 capsule (30 mg total) by mouth daily. 30 capsule 1  . furosemide (LASIX) 40 MG tablet Take 1 tablet (40 mg total) by mouth 2 (two) times daily. 180 tablet 1  . hydrALAZINE (APRESOLINE) 50 MG tablet Take 1.5 tablets (75 mg total) by mouth every 8 (eight) hours. 405 tablet 1  . insulin aspart protamine - aspart (NOVOLOG MIX 70/30 FLEXPEN) (70-30) 100 UNIT/ML FlexPen Inject 0.15 mLs (15 Units total) into the skin 2 (two) times daily with a meal. (Patient  taking differently: Inject 15 Units into the skin 2 (two) times daily as needed (high blood sugar). ) 15 mL 11  . Insulin Pen Needle 31G X 5 MM MISC Use daily to inject insulin as instructed 100 each 2  . isosorbide mononitrate (IMDUR) 30 MG 24 hr tablet Take 1 tablet (30 mg total) by mouth daily. 90 tablet 3  . metoprolol succinate (TOPROL-XL) 50 MG 24 hr tablet Take 1 tablet (50 mg total) by mouth daily. Take with or immediately following a meal. 90 tablet 3  . Multiple Vitamin (MULTIVITAMIN WITH MINERALS) TABS tablet Take 1 tablet by mouth daily. 90 tablet 3  . sitaGLIPtin-metformin (JANUMET) 50-1000 MG tablet Take 1 tablet by mouth daily.    . ticagrelor (BRILINTA) 90 MG TABS tablet Take 90 mg by mouth 2 (two) times daily.    Marland Kitchen ezetimibe (ZETIA) 10 MG tablet Take 1 tablet (10 mg total) by mouth daily. 90 tablet 3   No current facility-administered medications for this visit.      Past Medical History:  Diagnosis Date  . Anxiety    self reported  . Arthritis   . CHF (congestive heart failure) ([facility])   . Coronary artery disease   . Depression    self reported  . Diabetes mellitus   . Hypertension     Past Surgical History:  Procedure Laterality Date  . AMPUTATION Left 05/10/2016   Procedure: Left Transmetatarsal Amputation;  Surgeon: [name], MD;  Location: [facility];  Service: Orthopedics;  Laterality: Left;  . AMPUTATION Right 05/16/2018   Procedure: PARTIAL RAY AMPUTATION FIFTH TOE RIGHT FOOT;  Surgeon: [name], DPM;  Location: [facility];  Service: Podiatry;  Laterality: Right;  . AMPUTATION Right 06/17/2018   Procedure: AMPUTATION 4th RAY Right Foot;  Surgeon: Trula Slade, DPM;  Location: [facility];  Service: Podiatry;  Laterality: Right;  . AMPUTATION Right 06/19/2018   Procedure: RIGHT BELOW KNEE AMPUTATION;  Surgeon: [name], MD;  Location: [facility];  Service: Orthopedics;  Laterality: Right;  . APPLICATION OF WOUND VAC Right 06/19/2018   Procedure: APPLICATION OF  WOUND VAC;  Surgeon: [name], MD;  Location: [facility];  Service: Orthopedics;  Laterality: Right;  . CARDIAC CATHETERIZATION  07/29/2018  . CIRCUMCISION  09/02/2011   Procedure: CIRCUMCISION ADULT;  Surgeon: Molli Hazard, MD;  Location: [facility];  Service: Urology;  Laterality: N/A;  . CORONARY STENT INTERVENTION N/A 07/29/2018   Procedure: CORONARY STENT INTERVENTION;  Surgeon: Leonie Man, MD;  Location: [facility];  Service: Cardiovascular;  Laterality: N/A;  . I&D EXTREMITY  09/02/2011   Procedure: IRRIGATION AND DEBRIDEMENT EXTREMITY;  Surgeon: Theodoro Kos, DO;  Location: [facility];  Service: Plastics;  Laterality: N/A;  . INCISION AND DRAINAGE OF WOUND  08/12/2011   Procedure: IRRIGATION AND DEBRIDEMENT WOUND;  Surgeon: Zenovia Jarred, MD;  Location: [facility];  Service: General;;  perineum  . IRRIGATION AND DEBRIDEMENT ABSCESS  08/12/2011   Procedure: IRRIGATION AND DEBRIDEMENT ABSCESS;  Surgeon: Molli Hazard, MD;  Location: [facility];  Service: Urology;  Laterality: N/A;  irrigation and debridment perineum    . IRRIGATION AND DEBRIDEMENT ABSCESS  08/14/2011   Procedure: MINOR INCISION AND DRAINAGE OF ABSCESS;  Surgeon: Molli Hazard, MD;  Location: [facility];  Service: Urology;  Laterality: N/A;  Perineal Wound Debridement;Placement Bilateral Testicular Thigh Pouches  . LEFT HEART CATH AND CORONARY ANGIOGRAPHY N/A 07/29/2018   Procedure: LEFT HEART CATH AND CORONARY ANGIOGRAPHY;  Surgeon: Leonie Man, MD;  Location: [facility];  Service: Cardiovascular;  Laterality: N/A;  . TOTAL HIP ARTHROPLASTY Left 10/11/2016   Procedure: LEFT TOTAL HIP ARTHROPLASTY ANTERIOR APPROACH;  Surgeon: Mcarthur Rossetti, MD;  Location[facility];  Service: Orthopedics;  Laterality: Left;    Social History   Socioeconomic History  . Marital status: Widowed    Spouse name: Not on file  . Number of children: Not on file  . Years  of education: Not on file  . Highest education level: Not on file  Occupational History  . Occupation: Disabled  Social Needs  . Financial resource strain: Not on file  . Food insecurity    Worry: Not on file    Inability: Not on file  . Transportation needs    Medical: Not on file    Non-medical: Not on file  Tobacco Use  . Smoking status: Current Every Day Smoker    Types: Cigarettes  . Smokeless tobacco: Never Used  Substance and Sexual Activity  . Alcohol use: Yes    Alcohol/week: 0.0 standard drinks    Comment: has not had alcohol in a month  . Drug use: No  . Sexual activity: Not on file  Lifestyle  . Physical activity    Days per week: Not on file    Minutes per session: Not on file  . Stress: Not on file  Relationships  .  Social Herbalist on phone: Not on file    Gets together: Not on file    Attends religious service: Not on file    Active member of club or organization: Not on file    Attends meetings of clubs or organizations: Not on file    Relationship status: Not on file  . Intimate partner violence    Fear of current or ex partner: Not on file    Emotionally abused: Not on file    Physically abused: Not on file    Forced sexual activity: Not on file  Other Topics Concern  . Not on file  Social History Narrative   Lives w/ his 2 sons.    Family History  Problem Relation Age of Onset  . Diabetes Other     ROS: no fevers or chills, productive cough, hemoptysis, dysphasia, odynophagia, melena, hematochezia, dysuria, hematuria, rash, seizure activity, orthopnea, PND. Remaining systems are negative.  Physical Exam: Well-developed well-nourished in no acute distress.  Skin is warm and dry.  HEENT is normal.  Neck is supple.  Chest is clear to auscultation with normal expansion.  Cardiovascular exam is regular rate and rhythm.  2/6 systolic murmur apex Abdominal exam nontender or distended. No masses palpated. Extremities status post AKA  right.  1-2+ edema left lower extremity neuro grossly intact  A/P  1 chronic diastolic congestive heart failure-patient appears to be doing well from a symptomatic standpoint.  He does have persistent left lower extremity edema. Continue fluid restriction and low-sodium diet.  Increase Lasix to 60 mg twice daily.  Check potassium and renal function in 1 week.  2 coronary artery disease-continue aspirin, Brilinta and statin.  3 hyperlipidemia-continue Lipitor and Zetia.  Check lipids and liver.  4 cardiomyopathy-this was felt likely due to a combination of coronary disease and hypertension.  Continue beta-blocker.  His ARB was recently discontinued because of renal insufficiency.  Continue hydralazine/nitrates.  5 hypertension-patient's blood pressure is controlled.  Continue present medications and follow.  6 tobacco abuse-patient counseled on discontinuing.  7 moderate mitral regurgitation-we will need to follow echoes in the future.  8 chronic stage III kidney disease-check potassium and renal function.  Needs to be followed by nephrology.  9 peripheral vascular disease-continue aspirin and statin.  He has had a previous right BKA.  Kirk Ruths, MD

## 2019-03-17 ENCOUNTER — Encounter: Payer: Self-pay | Admitting: Physical Therapy

## 2019-03-17 ENCOUNTER — Other Ambulatory Visit: Payer: Self-pay

## 2019-03-17 ENCOUNTER — Ambulatory Visit: Payer: Medicaid Other | Admitting: Physical Therapy

## 2019-03-17 DIAGNOSIS — M79662 Pain in left lower leg: Secondary | ICD-10-CM

## 2019-03-17 DIAGNOSIS — R2689 Other abnormalities of gait and mobility: Secondary | ICD-10-CM

## 2019-03-17 DIAGNOSIS — R2681 Unsteadiness on feet: Secondary | ICD-10-CM

## 2019-03-17 DIAGNOSIS — M6281 Muscle weakness (generalized): Secondary | ICD-10-CM

## 2019-03-17 DIAGNOSIS — R293 Abnormal posture: Secondary | ICD-10-CM

## 2019-03-17 NOTE — Therapy (Signed)
[facility] [address] [place], [place], [address] Phone: [phone]   Fax:  [phone]  Physical Therapy Treatment  Patient Details  Name: Edward [name] MRN: [id number] Date of Birth: 1971-04-25 Referring Provider (PT): [name], [place]   Encounter Date: 03/17/2019   CLINIC OPERATION CHANGES: [facility] is open at lower capacity following universal masking, social distancing, and patient screening.  The patient's COVID risk of complications score is 6.   PT End of Session - 03/17/19 1900    Visit Number  10    Number of Visits  17    Authorization Type  Medicaid    Authorization Time Period  3 visits 01/21/2019 - 02/10/2019; 12 visits from 02/15/19 to 03/28/2019    Authorization - Visit Number  5    Authorization - Number of Visits  12    PT Start Time  1805    PT Stop Time  1845    PT Time Calculation (min)  40 min    Equipment Utilized During Treatment  Gait belt    Activity Tolerance  Patient tolerated treatment well;Patient limited by fatigue    Behavior During Therapy  WFL for tasks assessed/performed       Past Medical History:  Diagnosis Date  . Anxiety    self reported  . Arthritis   . CHF (congestive heart failure) ([facility])   . Coronary artery disease   . Depression    self reported  . Diabetes mellitus   . Hypertension     Past Surgical History:  Procedure Laterality Date  . AMPUTATION Left 05/10/2016   Procedure: Left Transmetatarsal Amputation;  Surgeon: [name], MD;  Location: [facility];  Service: Orthopedics;  Laterality: Left;  . AMPUTATION Right 05/16/2018   Procedure: PARTIAL RAY AMPUTATION FIFTH TOE RIGHT FOOT;  Surgeon: [name], DPM;  Location: [facility];  Service: Podiatry;  Laterality: Right;  . AMPUTATION Right 06/17/2018   Procedure: AMPUTATION 4th RAY Right Foot;  Surgeon: Trula Slade, DPM;  Location: [facility];  Service: Podiatry;  Laterality: Right;  .  AMPUTATION Right 06/19/2018   Procedure: RIGHT BELOW KNEE AMPUTATION;  Surgeon: [name], MD;  Location: [facility];  Service: Orthopedics;  Laterality: Right;  . APPLICATION OF WOUND VAC Right 06/19/2018   Procedure: APPLICATION OF WOUND VAC;  Surgeon: [name], MD;  Location: [facility];  Service: Orthopedics;  Laterality: Right;  . CARDIAC CATHETERIZATION  07/29/2018  . CIRCUMCISION  09/02/2011   Procedure: CIRCUMCISION ADULT;  Surgeon: Molli Hazard, MD;  Location: [facility];  Service: Urology;  Laterality: N/A;  . CORONARY STENT INTERVENTION N/A 07/29/2018   Procedure: CORONARY STENT INTERVENTION;  Surgeon: Leonie Man, MD;  Location: [facility];  Service: Cardiovascular;  Laterality: N/A;  . I&D EXTREMITY  09/02/2011   Procedure: IRRIGATION AND DEBRIDEMENT EXTREMITY;  Surgeon: Theodoro Kos, DO;  Location: [facility];  Service: Plastics;  Laterality: N/A;  . INCISION AND DRAINAGE OF WOUND  08/12/2011   Procedure: IRRIGATION AND DEBRIDEMENT WOUND;  Surgeon: Zenovia Jarred, MD;  Location: [facility];  Service: General;;  perineum  . IRRIGATION AND DEBRIDEMENT ABSCESS  08/12/2011   Procedure: IRRIGATION AND DEBRIDEMENT ABSCESS;  Surgeon: Molli Hazard, MD;  Location: [facility];  Service: Urology;  Laterality: N/A;  irrigation and debridment perineum    . IRRIGATION AND DEBRIDEMENT ABSCESS  08/14/2011   Procedure: MINOR INCISION AND DRAINAGE  OF ABSCESS;  Surgeon: Molli Hazard, MD;  Location: [facility];  Service: Urology;  Laterality: N/A;  Perineal Wound Debridement;Placement Bilateral Testicular Thigh Pouches  . LEFT HEART CATH AND CORONARY ANGIOGRAPHY N/A 07/29/2018   Procedure: LEFT HEART CATH AND CORONARY ANGIOGRAPHY;  Surgeon: Leonie Man, MD;  Location: [facility];  Service: Cardiovascular;  Laterality: N/A;  . TOTAL HIP ARTHROPLASTY Left 10/11/2016   Procedure: LEFT TOTAL HIP ARTHROPLASTY ANTERIOR APPROACH;  Surgeon:  Mcarthur Rossetti, MD;  Location: [facility];  Service: Orthopedics;  Laterality: Left;    There were no vitals filed for this visit.  Subjective Assessment - 03/17/19 1809    Subjective  Antiperspirant is helping. He is monitoring sweat.    Pertinent History  R TTA, L THA 10/11/2016, L transmetarsal amp 2017, CAD, CHF, MI, arthritis, DM, HTN, CKD stage III    Limitations  Lifting;Walking;Standing;House hold activities    Patient Stated Goals  to use prosthesis for balance & walking.     Currently in Pain?  Yes    Pain Score  3    up to 7/10 on 03/14/2019   Pain Location  Knee    Pain Orientation  Right    Pain Descriptors / Indicators  Aching;Sharp    Pain Type  Acute pain    Pain Onset  In the past 7 days    Pain Frequency  Intermittent    Aggravating Factors   walking    Pain Relieving Factors  sitting down                       [facility] Adult PT Treatment/Exercise - 03/17/19 1805      Transfers   Transfers  Sit to Stand;Stand to Sit    Sit to Stand  5: Supervision;With upper extremity assist;With armrests;From chair/3-in-1   chairs with & without armrests   Sit to Stand Details  Tactile cues for weight shifting;Visual cues for safe use of DME/AE;Verbal cues for sequencing;Verbal cues for technique    Sit to Stand Details (indicate cue type and reason)  working on not needing external support to stabilize    Stand to Sit  5: Supervision;With upper extremity assist;With armrests;To chair/3-in-1   chairs with & without armrests   Stand to Sit Details (indicate cue type and reason)  Tactile cues for sequencing;Tactile cues for placement;Visual cues for safe use of DME/AE;Verbal cues for sequencing;Verbal cues for technique;Verbal cues for safe use of DME/AE      Ambulation/Gait   Ambulation/Gait  Yes    Ambulation/Gait Assistance  4: Min guard;5: Supervision    Ambulation/Gait Assistance Details  worked on scanning while ambulating    Ambulation Distance (Feet)  200  Feet   200' x 2   Assistive device  Straight cane;Prosthesis   cane quad tip   Gait Pattern  Step-through pattern;Decreased step length - left;Decreased stance time - right;Decreased stride length;Decreased hip/knee flexion - right;Decreased weight shift to right;Right hip hike;Antalgic;Lateral hip instability;Trunk flexed;Abducted- right    Ambulation Surface  Indoor;Level    Ramp  4: Min assist   cane quad tip & TTA prosthesis   Ramp Details (indicate cue type and reason)  demo, tactile & verbal cues on technique with cane    Curb  4: Min assist   cane quad tip & TTA prosthesis   Curb Details (indicate cue type and reason)  demo, tactile & verbal cues on technique with cane  High Level Balance   High Level Balance Activities  Side stepping;Backward walking;Turns;Head turns;Negotitating around obstacles   cane quad tip & TTA prosthesis   High Level Balance Comments  demo & verbal cues on technique with TTA prosthesis & cane.       Self-Care   Self-Care  Lifting    Lifting  PT demo & verbal cues on technique with TTA prosthesis.  PT return demo understanding.       Neuro Re-ed    Neuro Re-ed Details   in parallel bars with intermittent UE support:  red theraband alt. UE & BUEs rows, forward reach & bicep curls 10 reps ea      Prosthetics   Prosthetic Care Comments   donning prosthesis with long pants with tighter legs.     Current prosthetic wear tolerance (days/week)   daily    Current prosthetic wear tolerance (#hours/day)   10-12 hours    Residual limb condition   intact no issues. gray cloudy skin from over moisture.     Education Provided  Skin check;Residual limb care;Proper Donning;Proper wear schedule/adjustment    Person(s) Educated  Patient    Education Method  Explanation;Demonstration;Tactile cues;Verbal cues    Education Method  Verbalized understanding;Verbal cues required;Needs further instruction               PT Short Term Goals - 02/04/19 1922       PT SHORT TERM GOAL #1   Title  Patient tolerates wear prosthesis greater than 12 hours total per day without skin integrity problems.  (All updated STGs Target Date 9th visit after PT reassessment on 01/14/2019)    Baseline  02/04/2019 Patient wearing prosthesis 8hrs /day without skin issues.    Time  3    Period  Weeks    Status  Revised      PT SHORT TERM GOAL #2   Title  patient verbalizes & demonstrates proper sweat management of prosthesis to minimize skin issues. (All updated STGs Target Date 9th visit after PT reassessment on 01/14/2019)    Baseline  Patient requires skilled PT cues to manage sweat & skin changes from excessive.    Time  3    Period  Weeks    Status  Revised      PT SHORT TERM GOAL #3   Title  patient ambulates 80' with cane quad tip and prosthesis with modA. (All updated STGs Target Date 9th visit after PT reassessment on 01/14/2019)    Baseline  Patient is dependent on bilateral UE support on RW & PT cues.    Time  3    Period  Weeks    Status  Revised      PT SHORT TERM GOAL #4   Title  Patient transfers sit to/from stand from chairs without armrests and stabilizes without UE support / touch with supervision.  (All updated STGs Target Date 9th visit after PT reassessment on 01/14/2019)    Baseline  Patient requires support of RW to stabilize upon arising from chair with armrests.    Time  3    Period  Weeks    Status  New      PT SHORT TERM GOAL #5   Title  Berg Balance >/= 22/56  (All updated STGs Target Date 9th visit after PT reassessment on 01/14/2019)    Baseline  02/04/2019   Merrilee Jansky Balance 18/56    Time  3    Period  Weeks    Status  Revised        PT Long Term Goals - 01/14/19 2318      PT LONG TERM GOAL #1   Title  Patient is independent with all aspects of prosthetic care to enable safe utilization of the prosthesis (All LTGs Target Date 15th visit after PT reassessment on 01/14/2019)    Baseline  patient is dependent in all aspects of prosthetic  care with risk of skin integrity issues     Time  9    Period  Weeks    Status  On-going      PT LONG TERM GOAL #2   Title  patient tolerates wear prosthesis daily for greater than 90% of awake hours without skin integrity problems or limb discomfort to enable function throughout his day     Baseline  Patient wearing prosthesis for last 2 weeks for only 2 hours.     Time  9    Period  Weeks    Status  On-going      PT LONG TERM GOAL #3   Title  Berg balance greater than 36 /56 to indicate lower fall risk and less dependency in standing ADLs     Baseline  Berg balance 14/56     Time  9    Period  Weeks    Status  On-going      PT LONG TERM GOAL #4   Title  patient ambulates 500 feet with LRAD and prosthesis modified independent to enable community access     Baseline  Patient ambulates 200' with RW & prosthesis with supervision with significant gait deviations & partial weight on prosthesis.     Time  9    Period  Weeks    Status  On-going      PT LONG TERM GOAL #5   Title  patient negotiates ramps & curbs with LRAD and stairs with one rail and the prosthesis modified independent to enable community access     Baseline  Patient requires [place] instruction to negotiate ramps & curbs with RW & prosthesis.     Time  9    Period  Weeks    Status  On-going            Plan - 03/17/19 2313    Clinical Impression Statement  Today's session focused on prosthetic gait with cane including scanning, negotiating furniture, ramps & curbs.  Neuromuscular reeducation balance activities including resistance UE activities.    Personal Factors and Comorbidities  Comorbidity 3+;Fitness;Time since onset of injury/illness/exacerbation    Comorbidities  R TTA, L THA 10/11/2016, L transmetarsal amp 2017, CAD, CHF, MI, arthritis, DM2, HTN, CKD stage III    Examination-Activity Limitations  Caring for Others;Carry;Locomotion Level;Reach Overhead;Stairs;Stand;Transfers     Examination-Participation Restrictions  Community Activity;Driving;Personal Finances    Stability/Clinical Decision Making  Evolving/Moderate complexity    Rehab Potential  Good    PT Frequency  2x / week   1x/wk for 3 weeks, then 2x/wk for 6 weeks   PT Duration  Other (comment)   9 weeks   PT Treatment/Interventions  ADLs/Self Care Home Management;DME Instruction;Gait training;Stair training;Functional mobility training;Canalith Repostioning;Therapeutic activities;Therapeutic exercise;Balance training;Neuromuscular re-education;Patient/family education;Prosthetic Training;Manual techniques;Vestibular    PT Next Visit Plan  continue with cane/prosthesis with gait/barriers, balance activities    PT Home Exercise Plan  Access Code: [email or web address] & Amputee sink HEP    Consulted and Agree with Plan of Care  Patient       Patient will benefit  from skilled therapeutic intervention in order to improve the following deficits and impairments:  Abnormal gait, Decreased activity tolerance, Decreased balance, Decreased endurance, Decreased knowledge of use of DME, Decreased mobility, Decreased skin integrity, Decreased strength, Prosthetic Dependency, Postural dysfunction, Pain  Visit Diagnosis: 1. Other abnormalities of gait and mobility   2. Unsteadiness on feet   3. Muscle weakness (generalized)   4. Abnormal posture   5. Pain in left lower leg        Problem List Patient Active Problem List   Diagnosis Date Noted  . Elevated troponin 12/22/2018  . CHF (congestive heart failure) ([facility]) 12/22/2018  . Acute exacerbation of CHF (congestive heart failure) ([facility]) 12/21/2018  . Gait abnormality 09/11/2018  . CAD S/P percutaneous coronary angioplasty 08/06/2018  . Cardiomyopathy ([facility]) 08/06/2018  . PVD (peripheral vascular disease) ([facility]) 08/06/2018  . Shortness of breath   . Acute on chronic combined systolic and diastolic CHF, NYHA class 2 ([facility])   . Non-ST elevation (NSTEMI) myocardial infarction  ([facility]) 07/28/2018  . Respiratory failure with hypoxia ([facility]) 07/28/2018  . Acute heart failure ([facility]) 07/28/2018  . Hypertensive emergency 07/28/2018  . S/P BKA (below knee amputation) unilateral, right ([facility]) 07/15/2018  . Hypoglycemia   . Anxiety about health   . Labile blood glucose   . Essential hypertension   . History of transmetatarsal amputation of left foot ([facility])   . Hypoalbuminemia due to protein-calorie malnutrition ([facility])   . Stage 3 chronic kidney disease ([facility])   . Poorly controlled type 2 diabetes mellitus with peripheral neuropathy ([facility])   . Acute blood loss anemia   . Anemia of chronic disease   . Leukocytosis   . Tobacco abuse   . Hyperkalemia 06/20/2018  . Diabetic polyneuropathy associated with type 2 diabetes mellitus ([facility])   . Severe protein-calorie malnutrition ([facility])   . AKI (acute kidney injury) ([facility]) 06/17/2018  . Normocytic anemia 06/17/2018  . DM2 (diabetes mellitus, type 2) ([facility]) 05/14/2018  . Viral pharyngitis 01/14/2017  . Cigarette smoker 01/14/2017  . Avascular necrosis of hip, left ([facility]) 10/11/2016  . Status post left hip replacement 10/11/2016  . S/P transmetatarsal amputation of foot, left ([facility]) 07/25/2016  . Balance problem 07/25/2016  . Fournier's gangrene 08/16/2011  . Acute respiratory failure ([place]) 08/16/2011  . Septic shock(785.52) 08/16/2011  . Insulin dependent diabetes mellitus with complications ([facility]) 60/63/0160    Hilari Wethington PT, DPT 03/17/2019, 11:16 PM  [facility] [address] [place], [place], [address] Phone: [phone]   Fax:  [phone]  Name: UMER HARIG MRN: [id number] Date of Birth: 02-Jul-1971

## 2019-03-18 ENCOUNTER — Ambulatory Visit: Payer: Medicaid Other | Admitting: Cardiology

## 2019-03-18 ENCOUNTER — Encounter: Payer: Self-pay | Admitting: Cardiology

## 2019-03-18 VITALS — BP 132/90 | HR 81 | Temp 97.3°F | Ht 73.0 in | Wt 242.0 lb

## 2019-03-18 DIAGNOSIS — N183 Chronic kidney disease, stage 3 unspecified: Secondary | ICD-10-CM

## 2019-03-18 DIAGNOSIS — E7800 Pure hypercholesterolemia, unspecified: Secondary | ICD-10-CM | POA: Diagnosis not present

## 2019-03-18 DIAGNOSIS — Z9861 Coronary angioplasty status: Secondary | ICD-10-CM

## 2019-03-18 DIAGNOSIS — I2510 Atherosclerotic heart disease of native coronary artery without angina pectoris: Secondary | ICD-10-CM

## 2019-03-18 DIAGNOSIS — I5043 Acute on chronic combined systolic (congestive) and diastolic (congestive) heart failure: Secondary | ICD-10-CM | POA: Diagnosis not present

## 2019-03-18 DIAGNOSIS — I10 Essential (primary) hypertension: Secondary | ICD-10-CM

## 2019-03-18 MED ORDER — FUROSEMIDE 40 MG PO TABS: 60.0000 mg | ORAL_TABLET | Freq: Two times a day (BID) | ORAL | 3 refills | Status: DC

## 2019-03-18 MED FILL — FUROSEMIDE 40 MG TAB: 40 | 30 days supply | Qty: 90 | Fill #0

## 2019-03-18 NOTE — Patient Instructions (Signed)
Medication Instructions:  INCREASE FUROSEMIDE TO 60 MG TWICE DAILY= 1 AND 1/2 OF THE 40 MG TABLET TWICE DAILY  If you need a refill on your cardiac medications before your next appointment, please call your pharmacy.   Lab work: Your physician recommends that you return for lab work in: [place]  If you have labs (blood work) drawn today and your tests are completely normal, you will receive your results only by: Marland Kitchen MyChart Message (if you have MyChart) OR . A paper copy in the mail If you have any lab test that is abnormal or we need to change your treatment, we will call you to review the results.  Follow-Up: At [facility], you and your health needs are our priority.  As part of our continuing mission to provide you with exceptional heart care, we have created designated Provider Care Teams.  These Care Teams include your primary Cardiologist (physician) and Advanced Practice Providers (APPs -  Physician Assistants and Nurse Practitioners) who all work together to provide you with the care you need, when you need it. You will need a follow up appointment in 6 months.  Please call our office 2 months in advance to schedule this appointment.  You may see Kirk Ruths, MD or one of the following Advanced Practice Providers on your designated Care Team:   Kerin Ransom, PA-C Roby Lofts, [place] . Sande Rives, PA-C  REFERRAL TO NEPHROLOGY FOR STAGE 3 KIDNEY DISEASE

## 2019-03-22 ENCOUNTER — Ambulatory Visit: Payer: Medicaid Other | Attending: Physician Assistant | Admitting: Physical Therapy

## 2019-03-22 DIAGNOSIS — M79662 Pain in left lower leg: Secondary | ICD-10-CM | POA: Insufficient documentation

## 2019-03-22 DIAGNOSIS — M6281 Muscle weakness (generalized): Secondary | ICD-10-CM | POA: Insufficient documentation

## 2019-03-22 DIAGNOSIS — R2681 Unsteadiness on feet: Secondary | ICD-10-CM | POA: Insufficient documentation

## 2019-03-22 DIAGNOSIS — R2689 Other abnormalities of gait and mobility: Secondary | ICD-10-CM | POA: Insufficient documentation

## 2019-03-22 DIAGNOSIS — Z9181 History of falling: Secondary | ICD-10-CM | POA: Insufficient documentation

## 2019-03-22 DIAGNOSIS — R293 Abnormal posture: Secondary | ICD-10-CM | POA: Insufficient documentation

## 2019-03-25 ENCOUNTER — Ambulatory Visit: Payer: Medicaid Other | Admitting: Physical Therapy

## 2019-03-26 ENCOUNTER — Ambulatory Visit: Payer: Medicaid Other | Admitting: Physical Therapy

## 2019-03-26 ENCOUNTER — Other Ambulatory Visit: Payer: Self-pay

## 2019-03-26 ENCOUNTER — Encounter: Payer: Self-pay | Admitting: Physical Therapy

## 2019-03-26 DIAGNOSIS — R2681 Unsteadiness on feet: Secondary | ICD-10-CM | POA: Diagnosis present

## 2019-03-26 DIAGNOSIS — M6281 Muscle weakness (generalized): Secondary | ICD-10-CM | POA: Diagnosis present

## 2019-03-26 DIAGNOSIS — Z9181 History of falling: Secondary | ICD-10-CM | POA: Diagnosis present

## 2019-03-26 DIAGNOSIS — R2689 Other abnormalities of gait and mobility: Secondary | ICD-10-CM | POA: Diagnosis present

## 2019-03-26 DIAGNOSIS — R293 Abnormal posture: Secondary | ICD-10-CM

## 2019-03-26 DIAGNOSIS — M79662 Pain in left lower leg: Secondary | ICD-10-CM | POA: Diagnosis present

## 2019-03-26 NOTE — Therapy (Addendum)
[facility] [address] [place], [place], [address] Phone: [phone]   Fax:  [phone]  Physical Therapy Treatment  Patient Details  Name: Edward [name] MRN: [id number] Date of Birth: Feb 09, 1971 Referring Provider (PT): [name], [place]   Encounter Date: 03/26/2019  PT End of Session - 03/26/19 1020    Visit Number  11    Number of Visits  17    Authorization Type  Medicaid    Authorization Time Period  3 visits 01/21/2019 - 02/10/2019; 12 visits from 02/15/19 to 03/28/2019    Authorization - Visit Number  6    Authorization - Number of Visits  12    PT Start Time  1015    PT Stop Time  1100    PT Time Calculation (min)  45 min    Equipment Utilized During Treatment  Gait belt    Activity Tolerance  Patient tolerated treatment well;Patient limited by fatigue    Behavior During Therapy  [facility] for tasks assessed/performed       Past Medical History:  Diagnosis Date  . Anxiety    self reported  . Arthritis   . CHF (congestive heart failure) ([place])   . Coronary artery disease   . Depression    self reported  . Diabetes mellitus   . Hypertension     Past Surgical History:  Procedure Laterality Date  . AMPUTATION Left 05/10/2016   Procedure: Left Transmetatarsal Amputation;  Surgeon: [name], MD;  Location: [place];  Service: Orthopedics;  Laterality: Left;  . AMPUTATION Right 05/16/2018   Procedure: PARTIAL RAY AMPUTATION FIFTH TOE RIGHT FOOT;  Surgeon: [name], DPM;  Location: [facility];  Service: Podiatry;  Laterality: Right;  . AMPUTATION Right 06/17/2018   Procedure: AMPUTATION 4th RAY Right Foot;  Surgeon: Trula Slade, DPM;  Location: [place];  Service: Podiatry;  Laterality: Right;  . AMPUTATION Right 06/19/2018   Procedure: RIGHT BELOW KNEE AMPUTATION;  Surgeon: [name], MD;  Location: [place];  Service: Orthopedics;  Laterality: Right;  . APPLICATION OF WOUND VAC Right 06/19/2018    Procedure: APPLICATION OF WOUND VAC;  Surgeon: [name], MD;  Location: [place];  Service: Orthopedics;  Laterality: Right;  . CARDIAC CATHETERIZATION  07/29/2018  . CIRCUMCISION  09/02/2011   Procedure: CIRCUMCISION ADULT;  Surgeon: Molli Hazard, MD;  Location: [facility] [place];  Service: Urology;  Laterality: N/A;  . CORONARY STENT INTERVENTION N/A 07/29/2018   Procedure: CORONARY STENT INTERVENTION;  Surgeon: Leonie Man, MD;  Location: [facility];  Service: Cardiovascular;  Laterality: N/A;  . I&D EXTREMITY  09/02/2011   Procedure: IRRIGATION AND DEBRIDEMENT EXTREMITY;  Surgeon: Theodoro Kos, DO;  Location: [facility];  Service: Plastics;  Laterality: N/A;  . INCISION AND DRAINAGE OF WOUND  08/12/2011   Procedure: IRRIGATION AND DEBRIDEMENT WOUND;  Surgeon: Zenovia Jarred, MD;  Location: [facility];  Service: General;;  perineum  . IRRIGATION AND DEBRIDEMENT ABSCESS  08/12/2011   Procedure: IRRIGATION AND DEBRIDEMENT ABSCESS;  Surgeon: Molli Hazard, MD;  Location: [facility];  Service: Urology;  Laterality: N/A;  irrigation and debridment perineum    . IRRIGATION AND DEBRIDEMENT ABSCESS  08/14/2011   Procedure: MINOR INCISION AND DRAINAGE OF ABSCESS;  Surgeon: Molli Hazard, MD;  Location: [facility];  Service: Urology;  Laterality: N/A;  Perineal Wound Debridement;Placement Bilateral Testicular Thigh Pouches  . LEFT HEART CATH AND  CORONARY ANGIOGRAPHY N/A 07/29/2018   Procedure: LEFT HEART CATH AND CORONARY ANGIOGRAPHY;  Surgeon: Leonie Man, MD;  Location: [facility];  Service: Cardiovascular;  Laterality: N/A;  . TOTAL HIP ARTHROPLASTY Left 10/11/2016   Procedure: LEFT TOTAL HIP ARTHROPLASTY ANTERIOR APPROACH;  Surgeon: Mcarthur Rossetti, MD;  Location: [facility];  Service: Orthopedics;  Laterality: Left;    There were no vitals filed for this visit.  Subjective Assessment - 03/26/19 1017    Subjective  No new  complaints. No falls or pain to report. Sees[facility] on the 10th (?, next week) to have adjustments done.    Pertinent History  R TTA, L THA 10/11/2016, L transmetarsal amp 2017, CAD, CHF, MI, arthritis, DM, HTN, CKD stage III    Limitations  Lifting;Walking;Standing;House hold activities    Patient Stated Goals  to use prosthesis for balance & walking.     Currently in Pain?  No/denies    Pain Score  0-No pain         [facility] PT Assessment - 03/26/19 1031      Berg Balance Test   Sit to Stand  Able to stand  independently using hands    Standing Unsupported  Able to stand 2 minutes with supervision    Sitting with Back Unsupported but Feet Supported on Floor or Stool  Able to sit safely and securely 2 minutes    Stand to Sit  Controls descent by using hands    Transfers  Able to transfer safely, minor use of hands    Standing Unsupported with Eyes Closed  Able to stand 10 seconds with supervision    Standing Unsupported with Feet Together  Able to place feet together independently and stand for 1 minute with supervision    From Standing, Reach Forward with Outstretched Arm  Can reach forward >12 cm safely (5")   5 inches   From Standing Position, Pick up Object from Floor  Able to pick up shoe, needs supervision    From Standing Position, Turn to Look Behind Over each Shoulder  Turn sideways only but maintains balance    Turn 360 Degrees  Needs close supervision or verbal cueing    Standing Unsupported, Alternately Place Feet on Step/Stool  Able to complete >2 steps/needs minimal assist    Standing Unsupported, One Foot in Front  Able to take small step independently and hold 30 seconds   min guard assist for safety   Standing on One Leg  Unable to try or needs assist to prevent fall    Total Score  35    Berg comment:  35/56= high fall risk            [facility] Adult PT Treatment/Exercise - 03/26/19 1023      Transfers   Transfers  Sit to Stand;Stand to Sit    Sit to Stand  5:  Supervision;With upper extremity assist;From chair/3-in-1    Stand to Sit  5: Supervision;With upper extremity assist;To chair/3-in-1      Ambulation/Gait   Ambulation/Gait  Yes    Ambulation/Gait Assistance  4: Min guard    Ambulation/Gait Assistance Details  cues on posture, sequencing and step length with gait.  no significant balance issues noted.     Ambulation Distance (Feet)  130 Feet    Assistive device  Straight cane    Gait Pattern  Step-through pattern;Decreased step length - left;Decreased stance time - right;Decreased stride length;Decreased hip/knee flexion - right;Decreased weight shift to  right;Right hip hike;Antalgic;Lateral hip instability;Trunk flexed;Abducted- right    Ambulation Surface  Level;Indoor      Prosthetics   Prosthetic Care Comments   provided education on use of cut off sock to assist with proper fit of prosthesis due to loose at distal end and tight at knee.     Current prosthetic wear tolerance (days/week)   daily    Current prosthetic wear tolerance (#hours/day)   most awake hours, drying as needed    Residual limb condition   intact with no issues    Education Provided  Residual limb care;Correct ply sock adjustment;Proper weight-bearing schedule/adjustment    Person(s) Educated  Patient    Education Method  Explanation;Demonstration;Verbal cues    Education Method  Verbalized understanding;Verbal cues required;Needs further instruction    Donning Prosthesis  Modified independent (device/increased time)    Doffing Prosthesis  Modified independent (device/increased time)               PT Short Term Goals - 03/26/19 1020      PT SHORT TERM GOAL #1   Title  Patient tolerates wear prosthesis greater than 12 hours total per day without skin integrity problems.  (All updated STGs Target Date 9th visit after PT reassessment on 01/14/2019)    Baseline  03/26/19: wearing most awake hours    Time  --    Period  --    Status  Achieved      PT SHORT  TERM GOAL #2   Title  patient verbalizes & demonstrates proper sweat management of prosthesis to minimize skin issues. (All updated STGs Target Date 9th visit after PT reassessment on 01/14/2019)    Baseline  03/26/19: using anti perspirant with improved sweat management.    Time  --    Period  --    Status  Achieved      PT SHORT TERM GOAL #3   Title  patient ambulates 100' with cane quad tip and prosthesis with modA. (All updated STGs Target Date 9th visit after PT reassessment on 01/14/2019)    Baseline  03/26/19: 130 feet with straight cane with single point tip with min guard assist    Time  --    Period  --    Status  Achieved      PT SHORT TERM GOAL #4   Title  Patient transfers sit to/from stand from chairs without armrests and stabilizes without UE support / touch with supervision.  (All updated STGs Target Date 9th visit after PT reassessment on 01/14/2019)    Baseline  03/26/19: met today    Time  --    Period  --    Status  Achieved      PT SHORT TERM GOAL #5   Title  Berg Balance >/= 22/56  (All updated STGs Target Date 9th visit after PT reassessment on 01/14/2019)    Baseline  03/26/19: 35/56 scored today    Time  --    Period  --    Status  Achieved        PT Long Term Goals - 03/26/19 1242      PT LONG TERM GOAL #1   Title  Patient is independent with all aspects of prosthetic care to enable safe utilization of the prosthesis (All LTGs Target Date 15th visit after PT reassessment on 01/14/2019)    Baseline  03/26/19: pt continues to need min cues/guidance on new aspects related to prosthetic care    Time  9  Period  Weeks    Status  On-going      PT LONG TERM GOAL #2   Title  patient tolerates wear prosthesis daily for greater than 90% of awake hours without skin integrity problems or limb discomfort to enable function throughout his day     Baseline  03/26/19: pt currently wearing up to 10-12 hours a day with pain with prolonged wear    Time  9    Period  Weeks     Status  On-going      PT LONG TERM GOAL #3   Title  Berg balance greater than 45 /56 to indicate lower fall risk and less dependency in standing ADLs    Baseline  03/26/19: scored 35/56. revised LTG due to improvement from  14/56 to 35/56 (original goal 36/56) per PT instruction.    Time  9    Period  Weeks    Status  Revised      PT LONG TERM GOAL #4   Title  patient ambulates 500 feet with LRAD and prosthesis modified independent to enable community access     Baseline  03/26/19: pt able to ambulate 130 feet with straight cane with min guard assist.    Time  9    Period  Weeks    Status  On-going      PT LONG TERM GOAL #5   Title  patient negotiates ramps & curbs with LRAD and stairs with one rail and the prosthesis modified independent to enable community access     Baseline  03/26/19: min assist with cane on ramps/curbs with prosthesis.    Time  9    Period  Weeks    Status  On-going            Plan - 03/26/19 1020    Clinical Impression Statement  Today's skilled session focused on progress toward STGs with all goals met. Will submit to Medicaid for extended time/visits as current authorization ends today. The pt is making steady progress toward goals and should benefit from continued PT to progress toward unmet goals.    Personal Factors and Comorbidities  Comorbidity 3+;Fitness;Time since onset of injury/illness/exacerbation    Comorbidities  R TTA, L THA 10/11/2016, L transmetarsal amp 2017, CAD, CHF, MI, arthritis, DM2, HTN, CKD stage III    Examination-Activity Limitations  Caring for Others;Carry;Locomotion Level;Reach Overhead;Stairs;Stand;Transfers    Examination-Participation Restrictions  Community Activity;Driving;Personal Finances    Stability/Clinical Decision Making  Evolving/Moderate complexity    Rehab Potential  Good    PT Frequency  2x / week   1x/wk for 3 weeks, then 2x/wk for 6 weeks   PT Duration  Other (comment)   9 weeks   PT Treatment/Interventions   ADLs/Self Care Home Management;DME Instruction;Gait training;Stair training;Functional mobility training;Canalith Repostioning;Therapeutic activities;Therapeutic exercise;Balance training;Neuromuscular re-education;Patient/family education;Prosthetic Training;Manual techniques;Vestibular    PT Next Visit Plan  will need recert on return as that will be week 9 (if i counted correctly) and plan of care was for 9 weeks (has has limited attendance due to transportation issues); continue with cane/prosthesis with gait/barriers, balance activities    PT Home Exercise Plan  Access Code: [email or web address] & Amputee sink HEP    Consulted and Agree with Plan of Care  Patient       Patient will benefit from skilled therapeutic intervention in order to improve the following deficits and impairments:  Abnormal gait, Decreased activity tolerance, Decreased balance, Decreased endurance, Decreased knowledge of use of DME, Decreased mobility,  Decreased skin integrity, Decreased strength, Prosthetic Dependency, Postural dysfunction, Pain  Visit Diagnosis: 1. Other abnormalities of gait and mobility   2. Unsteadiness on feet   3. Muscle weakness (generalized)   4. Abnormal posture        Problem List Patient Active Problem List   Diagnosis Date Noted  . Elevated troponin 12/22/2018  . CHF (congestive heart failure) ([place]) 12/22/2018  . Acute exacerbation of CHF (congestive heart failure) ([place]) 12/21/2018  . Gait abnormality 09/11/2018  . CAD S/P percutaneous coronary angioplasty 08/06/2018  . Cardiomyopathy ([place]) 08/06/2018  . PVD (peripheral vascular disease) ([place]) 08/06/2018  . Shortness of breath   . Acute on chronic combined systolic and diastolic CHF, NYHA class 2 ([place])   . Non-ST elevation (NSTEMI) myocardial infarction ([place]) 07/28/2018  . Respiratory failure with hypoxia ([place]) 07/28/2018  . Acute heart failure ([place]) 07/28/2018  . Hypertensive emergency 07/28/2018  . S/P BKA (below knee amputation)  unilateral, right ([place]) 07/15/2018  . Hypoglycemia   . Anxiety about health   . Labile blood glucose   . Essential hypertension   . History of transmetatarsal amputation of left foot ([place])   . Hypoalbuminemia due to protein-calorie malnutrition ([place])   . Stage 3 chronic kidney disease ([place])   . Poorly controlled type 2 diabetes mellitus with peripheral neuropathy ([place])   . Acute blood loss anemia   . Anemia of chronic disease   . Leukocytosis   . Tobacco abuse   . Hyperkalemia 06/20/2018  . Diabetic polyneuropathy associated with type 2 diabetes mellitus ([place])   . Severe protein-calorie malnutrition ([place])   . AKI (acute kidney injury) ([place]) 06/17/2018  . Normocytic anemia 06/17/2018  . DM2 (diabetes mellitus, type 2) ([place]) 05/14/2018  . Viral pharyngitis 01/14/2017  . Cigarette smoker 01/14/2017  . Avascular necrosis of hip, left ([place]) 10/11/2016  . Status post left hip replacement 10/11/2016  . S/P transmetatarsal amputation of foot, left ([place]) 07/25/2016  . Balance problem 07/25/2016  . Fournier's gangrene 08/16/2011  . Acute respiratory failure ([place]) 08/16/2011  . Septic shock(785.52) 08/16/2011  . Insulin dependent diabetes mellitus with complications ([place]) 94/85/4627    Willow Ora, PTA, [facility] [address], [place], [place] [address] [phone] 03/26/19, 12:46 PM   Name: Edward [name] MRN: [id number] Date of Birth: [birth year]

## 2019-04-06 ENCOUNTER — Ambulatory Visit: Payer: Medicaid Other | Admitting: Physical Therapy

## 2019-04-09 ENCOUNTER — Other Ambulatory Visit: Payer: Self-pay | Admitting: *Deleted

## 2019-04-09 DIAGNOSIS — Z20822 Contact with and (suspected) exposure to covid-19: Secondary | ICD-10-CM

## 2019-04-11 LAB — NOVEL CORONAVIRUS, NAA: SARS-CoV-2, NAA: NOT DETECTED

## 2019-04-12 ENCOUNTER — Ambulatory Visit: Payer: Medicaid Other | Admitting: Physical Therapy

## 2019-04-16 ENCOUNTER — Ambulatory Visit: Payer: Medicaid Other | Admitting: Physical Therapy

## 2019-04-16 ENCOUNTER — Other Ambulatory Visit: Payer: Self-pay

## 2019-04-16 ENCOUNTER — Encounter: Payer: Self-pay | Admitting: Physical Therapy

## 2019-04-16 ENCOUNTER — Telehealth: Payer: Self-pay | Admitting: Physical Therapy

## 2019-04-16 VITALS — BP 140/100 | HR 78

## 2019-04-16 DIAGNOSIS — R293 Abnormal posture: Secondary | ICD-10-CM

## 2019-04-16 DIAGNOSIS — I5043 Acute on chronic combined systolic (congestive) and diastolic (congestive) heart failure: Secondary | ICD-10-CM

## 2019-04-16 DIAGNOSIS — R2681 Unsteadiness on feet: Secondary | ICD-10-CM

## 2019-04-16 DIAGNOSIS — R2689 Other abnormalities of gait and mobility: Secondary | ICD-10-CM

## 2019-04-16 DIAGNOSIS — Z9181 History of falling: Secondary | ICD-10-CM

## 2019-04-16 DIAGNOSIS — M79662 Pain in left lower leg: Secondary | ICD-10-CM

## 2019-04-16 DIAGNOSIS — M6281 Muscle weakness (generalized): Secondary | ICD-10-CM

## 2019-04-16 NOTE — Therapy (Signed)
[facility] [address] [place], [place], [address] Phone: [phone]   Fax:  [phone]  Physical Therapy Treatment  Patient Details  Name: Edward [name] MRN: [id number] Date of Birth: [birth year] Referring Provider (PT): [name], [place]   Encounter Date: 04/16/2019   PT End of Session - 04/16/19 2113    Visit Number  11    Number of Visits  23    Date for PT Re-Evaluation  05/23/19    Authorization Type  Medicaid    Authorization Time Period  3 visits 01/21/2019 - 02/10/2019; 12 visits from 02/15/19 to 03/28/2019; 12 more visits between 04/12/2019 - 05/23/2019    Authorization - Visit Number  1    Authorization - Number of Visits  12    PT Start Time  6294    PT Stop Time  1531    PT Time Calculation (min)  37 min    Activity Tolerance  Treatment limited secondary to medical complications (Comment)   elevated BP   Behavior During Therapy  [facility] for tasks assessed/performed       Past Medical History:  Diagnosis Date  . Anxiety    self reported  . Arthritis   . CHF (congestive heart failure) ([place])   . Coronary artery disease   . Depression    self reported  . Diabetes mellitus   . Hypertension     Past Surgical History:  Procedure Laterality Date  . AMPUTATION Left 05/10/2016   Procedure: Left Transmetatarsal Amputation;  Surgeon: [name], MD;  Location: [facility] [place];  Service: Orthopedics;  Laterality: Left;  . AMPUTATION Right 05/16/2018   Procedure: PARTIAL RAY AMPUTATION FIFTH TOE RIGHT FOOT;  Surgeon: [name], DPM;  Location: [facility];  Service: Podiatry;  Laterality: Right;  . AMPUTATION Right 06/17/2018   Procedure: AMPUTATION 4th RAY Right Foot;  Surgeon: Trula Slade, DPM;  Location: [place];  Service: Podiatry;  Laterality: Right;  . AMPUTATION Right 06/19/2018   Procedure: RIGHT BELOW KNEE AMPUTATION;  Surgeon: [name], MD;  Location: [facility];  Service: Orthopedics;  Laterality: Right;   . APPLICATION OF WOUND VAC Right 06/19/2018   Procedure: APPLICATION OF WOUND VAC;  Surgeon: [name], MD;  Location: [place];  Service: Orthopedics;  Laterality: Right;  . CARDIAC CATHETERIZATION  07/29/2018  . CIRCUMCISION  09/02/2011   Procedure: CIRCUMCISION ADULT;  Surgeon: Molli Hazard, MD;  Location: [facility];  Service: Urology;  Laterality: N/A;  . CORONARY STENT INTERVENTION N/A 07/29/2018   Procedure: CORONARY STENT INTERVENTION;  Surgeon: Leonie Man, MD;  Location: [facility];  Service: Cardiovascular;  Laterality: N/A;  . I&D EXTREMITY  09/02/2011   Procedure: IRRIGATION AND DEBRIDEMENT EXTREMITY;  Surgeon: Theodoro Kos, DO;  Location: [place];  Service: Plastics;  Laterality: N/A;  . INCISION AND DRAINAGE OF WOUND  08/12/2011   Procedure: IRRIGATION AND DEBRIDEMENT WOUND;  Surgeon: Zenovia Jarred, MD;  Location: [facility] [place] [facility];  Service: General;;  perineum  . IRRIGATION AND DEBRIDEMENT ABSCESS  08/12/2011   Procedure: IRRIGATION AND DEBRIDEMENT ABSCESS;  Surgeon: Molli Hazard, MD;  Location: [facility];  Service: Urology;  Laterality: N/A;  irrigation and debridment perineum    . IRRIGATION AND DEBRIDEMENT ABSCESS  08/14/2011   Procedure: MINOR INCISION AND DRAINAGE OF ABSCESS;  Surgeon: Molli Hazard, MD;  Location: [facility];  Service: Urology;  Laterality: N/A;  Perineal Wound Debridement;Placement  Bilateral Testicular Thigh Pouches  . LEFT HEART CATH AND CORONARY ANGIOGRAPHY N/A 07/29/2018   Procedure: LEFT HEART CATH AND CORONARY ANGIOGRAPHY;  Surgeon: Leonie Man, MD;  Location: [facility];  Service: Cardiovascular;  Laterality: N/A;  . TOTAL HIP ARTHROPLASTY Left 10/11/2016   Procedure: LEFT TOTAL HIP ARTHROPLASTY ANTERIOR APPROACH;  Surgeon: Mcarthur Rossetti, MD;  Location: [facility];  Service: Orthopedics;  Laterality: Left;    Vitals:   04/16/19 1457 04/16/19 2134  BP: (!) 155/110 (!) 140/100   Pulse: 80 78  SpO2: 90% 98%    Subjective Assessment - 04/16/19 1501    Subjective  Pt reports not being able to sleep well last night. Took BP medication at 4am.  RLE was so swollen today he wasn't able to get his prosthesis on until about an hour ago.  SOB with gait.  Pt feeling very stressed.  Went to [employer] and had pre-tibial pads placed    Currently in Pain?  No/denies                       [facility] Adult PT Treatment/Exercise - 04/16/19 1516      Bed Mobility   Bed Mobility  Rolling Right;Rolling Left;Supine to Sit;Sit to Supine    Rolling Right  Independent    Rolling Left  Independent    Supine to Sit  Independent    Sit to Supine  Independent      Transfers   Transfers  Sit to Stand;Stand to Sit    Sit to Stand  6: Modified independent (Device/Increase time)    Stand to Sit  6: Modified independent (Device/Increase time)      Exercises   Exercises  Knee/Hip      Knee/Hip Exercises: Seated   Long Arc Quad  Right;Left;1 set;15 reps    Hamstring Curl  Right;1 set;10 reps      Knee/Hip Exercises: Supine   Bridges  Strengthening;Both;1 set;10 reps    Bridges Limitations  RLE out of prosthesis on bolster    Single Leg Bridge  Strengthening;Left;1 set;5 reps   bridging up and then lifting RLE     Knee/Hip Exercises: Sidelying   Hip ABduction  Strengthening;Right;2 sets    Hip ABduction Limitations  Straight hip ABD and then ABD combined with hip flexion <> extension; multiple rest breaks.  No prosthesis on LLE             PT Education - 04/16/19 2113    Education Details  Pt to continue to monitor breathing and BP and encouraged to contact cardiologist and schedule f/u appointment    Person(s) Educated  Patient    Methods  Explanation    Comprehension  Verbalized understanding       PT Short Term Goals - 04/16/19 2116      PT SHORT TERM GOAL #1   Title  = LTG        PT Long Term Goals - 04/16/19 2116      PT LONG TERM GOAL #1    Title  Patient is independent with all aspects of prosthetic care to enable safe utilization of the prosthesis    Baseline  03/26/19: pt continues to need min cues/guidance on new aspects related to prosthetic care    Time  12    Period  --   visits   Status  On-going    Target Date  05/23/19      PT LONG TERM GOAL #  2   Title  patient tolerates wear prosthesis daily for greater than 90% of awake hours without skin integrity problems or limb discomfort to enable function throughout his day     Baseline  03/26/19: pt currently wearing up to 10-12 hours a day with pain with prolonged wear    Time  12    Period  --   visits   Status  On-going    Target Date  05/23/19      PT LONG TERM GOAL #3   Title  Berg balance greater than 45 /56 to indicate lower fall risk and less dependency in standing ADLs    Baseline  03/26/19: scored 35/56. revised LTG due to improvement from  14/56 to 35/56 (original goal 36/56) per PT instruction.    Time  12    Period  --   visits   Status  Revised    Target Date  05/23/19      PT LONG TERM GOAL #4   Title  patient ambulates 500 feet with LRAD and prosthesis modified independent to enable community access     Baseline  03/26/19: pt able to ambulate 130 feet with straight cane with min guard assist.    Time  12    Period  --   visits   Status  On-going    Target Date  05/23/19      PT LONG TERM GOAL #5   Title  patient negotiates ramps & curbs with LRAD and stairs with one rail and the prosthesis modified independent to enable community access     Baseline  03/26/19: min assist with cane on ramps/curbs with prosthesis.    Time  12    Period  --   visits   Status  On-going    Target Date  05/23/19            Plan - 04/16/19 2119    Clinical Impression Statement  Pt presents today with elevated BP, increased SOB and reporting greater difficulty with sleep last night and difficulty with breathing even when on 3 pillows.   He also reported increased  swelling in RLE with increased difficulty donning prosthesis.  Recommended that pt contact cardiologist but pt reports his symptoms are due to increased stress.  PT to alert MD due to symptoms.  Pt was able to tolerate supine and sidelying LE strengthening exercises with intermittent rest breaks.  By end of session pt's BP had decreased by 10 points and edema had decreased in RLE allowing improved fit of prosthesis; pt also reporting feeling better.  Will continue to monitor closely.  Pt will benefit from ongoing skilled PT services to continue to address LE weakness, impaired balance, impaired endurance and impaired prosthetic gait to maximize functional mobility independence and decrease falls risk.    Personal Factors and Comorbidities  Comorbidity 3+;Fitness;Time since onset of injury/illness/exacerbation    Comorbidities  R TTA, L THA 10/11/2016, L transmetarsal amp 2017, CAD, CHF, MI, arthritis, DM2, HTN, CKD stage III    Examination-Activity Limitations  Caring for Others;Carry;Locomotion Level;Reach Overhead;Stairs;Stand;Transfers    Examination-Participation Restrictions  Community Activity;Driving;Personal Finances    Stability/Clinical Decision Making  Evolving/Moderate complexity    Clinical Decision Making  Moderate    Rehab Potential  Good    PT Frequency  2x / week   1x/wk for 3 weeks, then 2x/wk for 6 weeks   PT Duration  6 weeks   9 weeks   PT Treatment/Interventions  ADLs/Self Care Home  Management;DME Instruction;Gait training;Stair training;Functional mobility training;Canalith Repostioning;Therapeutic activities;Therapeutic exercise;Balance training;Neuromuscular re-education;Patient/family education;Prosthetic Training;Manual techniques;Vestibular    PT Next Visit Plan  Needs more visits scheduled for 2x/week; did he contact cardiologist about symptoms?  Monitor BP.  Continue to work on balance, endurance for gait > 500' with cane, obstacles/ramp/curb with cane    PT Home Exercise  Plan  Access Code: [email or web address] & Amputee sink HEP    Consulted and Agree with Plan of Care  Patient       Patient will benefit from skilled therapeutic intervention in order to improve the following deficits and impairments:  Abnormal gait, Decreased activity tolerance, Decreased balance, Decreased endurance, Decreased knowledge of use of DME, Decreased mobility, Decreased skin integrity, Decreased strength, Prosthetic Dependency, Postural dysfunction, Pain  Visit Diagnosis: Other abnormalities of gait and mobility  Unsteadiness on feet  Muscle weakness (generalized)  Abnormal posture  Pain in left lower leg  History of falling     Problem List Patient Active Problem List   Diagnosis Date Noted  . Elevated troponin 12/22/2018  . CHF (congestive heart failure) ([place]) 12/22/2018  . Acute exacerbation of CHF (congestive heart failure) ([place]) 12/21/2018  . Gait abnormality 09/11/2018  . CAD S/P percutaneous coronary angioplasty 08/06/2018  . Cardiomyopathy ([place]) 08/06/2018  . PVD (peripheral vascular disease) ([place]) 08/06/2018  . Shortness of breath   . Acute on chronic combined systolic and diastolic CHF, NYHA class 2 ([place])   . Non-ST elevation (NSTEMI) myocardial infarction ([name]) 07/28/2018  . Respiratory failure with hypoxia ([place]) 07/28/2018  . Acute heart failure ([place]) 07/28/2018  . Hypertensive emergency 07/28/2018  . S/P BKA (below knee amputation) unilateral, right ([place]) 07/15/2018  . Hypoglycemia   . Anxiety about health   . Labile blood glucose   . Essential hypertension   . History of transmetatarsal amputation of left foot ([place])   . Hypoalbuminemia due to protein-calorie malnutrition ([place])   . Stage 3 chronic kidney disease ([place])   . Poorly controlled type 2 diabetes mellitus with peripheral neuropathy ([place])   . Acute blood loss anemia   . Anemia of chronic disease   . Leukocytosis   . Tobacco abuse   . Hyperkalemia 06/20/2018  . Diabetic polyneuropathy  associated with type 2 diabetes mellitus ([place])   . Severe protein-calorie malnutrition ([place])   . AKI (acute kidney injury) ([place]) 06/17/2018  . Normocytic anemia 06/17/2018  . DM2 (diabetes mellitus, type 2) ([place]) 05/14/2018  . Viral pharyngitis 01/14/2017  . Cigarette smoker 01/14/2017  . Avascular necrosis of hip, left ([place]) 10/11/2016  . Status post left hip replacement 10/11/2016  . S/P transmetatarsal amputation of foot, left ([place]) 07/25/2016  . Balance problem 07/25/2016  . Fournier's gangrene 08/16/2011  . Acute respiratory failure ([place]) 08/16/2011  . Septic shock(785.52) 08/16/2011  . Insulin dependent diabetes mellitus with complications ([place]) 38/18/2993    Rico Junker, PT, DPT 04/16/19    9:35 PM    [facility] [address] [place], [place], [address] Phone: [phone]   Fax:  [phone]  Name: Edward [name] MRN: [id number] Date of Birth: [birth year]

## 2019-04-16 NOTE — Telephone Encounter (Signed)
Hello Dr. Stanford Breed, I worked with Mr. Langhans today and he reported a few concerning symptoms.  He reported his RLE (transtibial LE) was so swollen today that he wasn't able to don his prosthesis until about an hour before therapy.  He also reported that he did not sleep last night due to difficulty breathing even though he was up on 3 pillows.  He was more short of breath with ambulation and his BP after walking into the clinic was 155/110, HR: 80, Sp02: 98%.  Later his BP had decreased to 140/100, HR: 79, Spt02: 98%.    Strongly advised pt to contact your office but he requested not to.  He stated he was very stressed with multiple things and that is why he was awake all night and why his BP was elevated.   I wanted to alert you to his symptoms today in case you feel he needs to come in and be re-evaluated.    Thank you, Rico Junker, PT, DPT 04/16/19    9:49 PM

## 2019-04-17 NOTE — Telephone Encounter (Signed)
Increase lasix to 80 mg BID; bmet one week; fu paov Kirk Ruths

## 2019-04-19 MED ORDER — FUROSEMIDE 40 MG PO TABS: 80.0000 mg | ORAL_TABLET | Freq: Two times a day (BID) | ORAL | 3 refills | Status: DC

## 2019-04-19 MED FILL — FUROSEMIDE 40 MG TAB: 40 | 68 days supply | Qty: 270 | Fill #0

## 2019-04-19 NOTE — Telephone Encounter (Signed)
Spoke with pt, Aware of dr Jacalyn Lefevre recommendations. New script sent to the pharmacy and Lab orders mailed to the pt. Follow up scheduled

## 2019-04-20 ENCOUNTER — Ambulatory Visit: Payer: Medicare Other | Admitting: Physical Therapy

## 2019-04-22 ENCOUNTER — Ambulatory Visit: Payer: Medicare Other | Attending: Physician Assistant | Admitting: Physical Therapy

## 2019-04-22 ENCOUNTER — Encounter: Payer: Self-pay | Admitting: Physical Therapy

## 2019-04-22 ENCOUNTER — Other Ambulatory Visit: Payer: Self-pay

## 2019-04-22 VITALS — BP 148/88 | HR 84

## 2019-04-22 DIAGNOSIS — R2689 Other abnormalities of gait and mobility: Secondary | ICD-10-CM | POA: Diagnosis present

## 2019-04-22 DIAGNOSIS — M6281 Muscle weakness (generalized): Secondary | ICD-10-CM | POA: Insufficient documentation

## 2019-04-22 DIAGNOSIS — R2681 Unsteadiness on feet: Secondary | ICD-10-CM

## 2019-04-22 DIAGNOSIS — R293 Abnormal posture: Secondary | ICD-10-CM | POA: Insufficient documentation

## 2019-04-22 NOTE — Patient Instructions (Signed)
Walking Program:  Begin walking for exercise for 1-2 minutes, 3 times/day, 6-7 days/week.   Progress your walking program by adding 1 minutes to your routine each week, as tolerated. Be sure to wear good walking shoes, walk in a safe environment and only progress to your tolerance.

## 2019-04-22 NOTE — Therapy (Signed)
[facility] [address] [place], [place], [address] Phone: [phone]   Fax:  [phone]  Physical Therapy Treatment  Patient Details  Name: Edward [name] MRN: [id number] Date of Birth: 1971/06/08 Referring Provider (PT): [name], [place]   Encounter Date: 04/22/2019  PT End of Session - 04/22/19 1736    Visit Number  13   visit correction from last visit.   Number of Visits  23    Date for PT Re-Evaluation  05/23/19    Authorization Type  Medicaid    Authorization Time Period  3 visits 01/21/2019 - 02/10/2019; 12 visits from 02/15/19 to 03/28/2019; 12 more visits between 04/12/2019 - 05/23/2019    Authorization - Visit Number  1    Authorization - Number of Visits  12    PT Start Time  8144    PT Stop Time  1612    PT Time Calculation (min)  42 min    Activity Tolerance  Treatment limited secondary to medical complications (Comment)   elevated BP   Behavior During Therapy  WFL for tasks assessed/performed       Past Medical History:  Diagnosis Date  . Anxiety    self reported  . Arthritis   . CHF (congestive heart failure) ([place])   . Coronary artery disease   . Depression    self reported  . Diabetes mellitus   . Hypertension     Past Surgical History:  Procedure Laterality Date  . AMPUTATION Left 05/10/2016   Procedure: Left Transmetatarsal Amputation;  Surgeon: [name], MD;  Location: [facility];  Service: Orthopedics;  Laterality: Left;  . AMPUTATION Right 05/16/2018   Procedure: PARTIAL RAY AMPUTATION FIFTH TOE RIGHT FOOT;  Surgeon: [name], DPM;  Location: [facility];  Service: Podiatry;  Laterality: Right;  . AMPUTATION Right 06/17/2018   Procedure: AMPUTATION 4th RAY Right Foot;  Surgeon: Trula Slade, DPM;  Location: [facility];  Service: Podiatry;  Laterality: Right;  . AMPUTATION Right 06/19/2018   Procedure: RIGHT BELOW KNEE AMPUTATION;  Surgeon: [name], MD;  Location: [facility];  Service:  Orthopedics;  Laterality: Right;  . APPLICATION OF WOUND VAC Right 06/19/2018   Procedure: APPLICATION OF WOUND VAC;  Surgeon: [name], MD;  Location: [facility];  Service: Orthopedics;  Laterality: Right;  . CARDIAC CATHETERIZATION  07/29/2018  . CIRCUMCISION  09/02/2011   Procedure: CIRCUMCISION ADULT;  Surgeon: Molli Hazard, MD;  Location: [facility];  Service: Urology;  Laterality: N/A;  . CORONARY STENT INTERVENTION N/A 07/29/2018   Procedure: CORONARY STENT INTERVENTION;  Surgeon: Leonie Man, MD;  Location: [facility];  Service: Cardiovascular;  Laterality: N/A;  . I&D EXTREMITY  09/02/2011   Procedure: IRRIGATION AND DEBRIDEMENT EXTREMITY;  Surgeon: Theodoro Kos, DO;  Location: [facility];  Service: Plastics;  Laterality: N/A;  . INCISION AND DRAINAGE OF WOUND  08/12/2011   Procedure: IRRIGATION AND DEBRIDEMENT WOUND;  Surgeon: Zenovia Jarred, MD;  Location: [facility];  Service: General;;  perineum  . IRRIGATION AND DEBRIDEMENT ABSCESS  08/12/2011   Procedure: IRRIGATION AND DEBRIDEMENT ABSCESS;  Surgeon: Molli Hazard, MD;  Location: [facility];  Service: Urology;  Laterality: N/A;  irrigation and debridment perineum    . IRRIGATION AND DEBRIDEMENT ABSCESS  08/14/2011   Procedure: MINOR INCISION AND DRAINAGE OF ABSCESS;  Surgeon: Molli Hazard, MD;  Location: [facility];  Service: Urology;  Laterality:  N/A;  Perineal Wound Debridement;Placement Bilateral Testicular Thigh Pouches  . LEFT HEART CATH AND CORONARY ANGIOGRAPHY N/A 07/29/2018   Procedure: LEFT HEART CATH AND CORONARY ANGIOGRAPHY;  Surgeon: Leonie Man, MD;  Location: [facility];  Service: Cardiovascular;  Laterality: N/A;  . TOTAL HIP ARTHROPLASTY Left 10/11/2016   Procedure: LEFT TOTAL HIP ARTHROPLASTY ANTERIOR APPROACH;  Surgeon: Mcarthur Rossetti, MD;  [facility]: [facility];  Service: Orthopedics;  Laterality: Left;    Vitals:   04/22/19 1540  BP: (!)  148/88  Pulse: 84  SpO2: 95%    Subjective Assessment - 04/22/19 1533    Subjective  Still dealing with the swelling in R LE especially.  Pt is wearing shinker with prosthesis as well as at night. Pt has cardiology appointment 04/27/19.    Pertinent History  R TTA, L THA 10/11/2016, L transmetarsal amp 2017, CAD, CHF, MI, arthritis, DM, HTN, CKD stage III    Limitations  Lifting;Walking;Standing;House hold activities    Patient Stated Goals  to use prosthesis for balance & walking.     Currently in Pain?  No/denies                       [facility] Adult PT Treatment/Exercise - 04/22/19 0001      Transfers   Transfers  Sit to Stand;Stand to Sit    Sit to Stand  6: Modified independent (Device/Increase time)    Stand to Sit  6: Modified independent (Device/Increase time)      Ambulation/Gait   Ambulation/Gait  Yes    Ambulation/Gait Assistance  5: Supervision    Ambulation/Gait Assistance Details  Working on endurance    Ambulation Distance (Feet)  130 Feet   x2   Assistive device  Straight cane ; prosthesis   Gait Pattern  Step-through pattern;Decreased step length - left;Decreased stance time - right;Decreased stride length;Decreased hip/knee flexion - right;Decreased weight shift to right;Right hip hike;Antalgic;Lateral hip instability;Trunk flexed;Abducted- right    Ambulation Surface  Level;Indoor    Ramp  5: Supervision   with SPC and prosthesis   Ramp Details (indicate cue type and reason)  increased time for pt to self steady.    Curb  5: Supervision   with SPC and prosthesis   Curb Details (indicate cue type and reason)  increased time for pt to self steady.      Prosthetics   Prosthetic Care Comments   education on resason not to wear shrinker under liner with prosthesis donned, ply sock adjustment    Current prosthetic wear tolerance (days/week)   daily    Current prosthetic wear tolerance (#hours/day)   most awake hours, drying as needed    Residual limb  condition   intact with no issues    Education Provided  Residual limb care;Correct ply sock adjustment;Proper weight-bearing schedule/adjustment    Person(s) Educated  Patient    Education Method  Explanation;Demonstration;Verbal cues    Education Method  Verbalized understanding    Donning Prosthesis  Modified independent (device/increased time)    Doffing Prosthesis  Modified independent (device/increased time)             PT Education - 04/22/19 1610    Education Details  HEP handout for walking for exercise in the house.    Person(s) Educated  Patient    Methods  Explanation    Comprehension  Verbalized understanding       PT Short Term Goals - 04/16/19 2116  PT SHORT TERM GOAL #1   Title  = LTG        PT Long Term Goals - 04/16/19 2116      PT LONG TERM GOAL #1   Title  Patient is independent with all aspects of prosthetic care to enable safe utilization of the prosthesis    Baseline  03/26/19: pt continues to need min cues/guidance on new aspects related to prosthetic care    Time  12    Period  --   visits   Status  On-going    Target Date  05/23/19      PT LONG TERM GOAL #2   Title  patient tolerates wear prosthesis daily for greater than 90% of awake hours without skin integrity problems or limb discomfort to enable function throughout his day     Baseline  03/26/19: pt currently wearing up to 10-12 hours a day with pain with prolonged wear    Time  12    Period  --   visits   Status  On-going    Target Date  05/23/19      PT LONG TERM GOAL #3   Title  Berg balance greater than 45 /56 to indicate lower fall risk and less dependency in standing ADLs    Baseline  03/26/19: scored 35/56. revised LTG due to improvement from  14/56 to 35/56 (original goal 36/56) per PT instruction.    Time  12    Period  --   visits   Status  Revised    Target Date  05/23/19      PT LONG TERM GOAL #4   Title  patient ambulates 500 feet with LRAD and prosthesis modified  independent to enable community access     Baseline  03/26/19: pt able to ambulate 130 feet with straight cane with min guard assist.    Time  12    Period  --   visits   Status  On-going    Target Date  05/23/19      PT LONG TERM GOAL #5   Title  patient negotiates ramps & curbs with LRAD and stairs with one rail and the prosthesis modified independent to enable community access     Baseline  03/26/19: min assist with cane on ramps/curbs with prosthesis.    Time  12    Period  --   visits   Status  On-going    Target Date  05/23/19            Plan - 04/22/19 1611    Clinical Impression Statement  Skilled session focused on checking vitals, following up with pt about cardiologist appointment, educating pt on correct use of shrinker and ply sock adjustment, progressing HEP to include walking program in the house to increase endurance, and gait training with SPC.  Pt continues to require rest breaks with limited gait distance and activity tolerance.    Personal Factors and Comorbidities  Comorbidity 3+;Fitness;Time since onset of injury/illness/exacerbation    Comorbidities  R TTA, L THA 10/11/2016, L transmetarsal amp 2017, CAD, CHF, MI, arthritis, DM2, HTN, CKD stage III    Examination-Activity Limitations  Caring for Others;Carry;Locomotion Level;Reach Overhead;Stairs;Stand;Transfers    Examination-Participation Restrictions  Community Activity;Driving;Personal Finances    Stability/Clinical Decision Making  Evolving/Moderate complexity    Rehab Potential  Good    PT Frequency  2x / week   1x/wk for 3 weeks, then 2x/wk for 6 weeks   PT Duration  6 weeks  9 weeks   PT Treatment/Interventions  ADLs/Self Care Home Management;DME Instruction;Gait training;Stair training;Functional mobility training;Canalith Repostioning;Therapeutic activities;Therapeutic exercise;Balance training;Neuromuscular re-education;Patient/family education;Prosthetic Training;Manual techniques;Vestibular    PT  Next Visit Plan  Needs more visits scheduled for 2x/week; did he contact cardiologist about symptoms?  Monitor BP.  Continue to work on balance, endurance for gait > 500' with cane, obstacles/ramp/curb with cane    PT Home Exercise Plan  Access Code: [email or web address] & Amputee sink HEP    Consulted and Agree with Plan of Care  Patient       Patient will benefit from skilled therapeutic intervention in order to improve the following deficits and impairments:  Abnormal gait, Decreased activity tolerance, Decreased balance, Decreased endurance, Decreased knowledge of use of DME, Decreased mobility, Decreased skin integrity, Decreased strength, Prosthetic Dependency, Postural dysfunction, Pain  Visit Diagnosis: Other abnormalities of gait and mobility  Unsteadiness on feet  Muscle weakness (generalized)  Abnormal posture     Problem List Patient Active Problem List   Diagnosis Date Noted  . Elevated troponin 12/22/2018  . CHF (congestive heart failure) ([place]) 12/22/2018  . Acute exacerbation of CHF (congestive heart failure) ([place]) 12/21/2018  . Gait abnormality 09/11/2018  . CAD S/P percutaneous coronary angioplasty 08/06/2018  . Cardiomyopathy ([place]) 08/06/2018  . PVD (peripheral vascular disease) ([place]) 08/06/2018  . Shortness of breath   . Acute on chronic combined systolic and diastolic CHF, NYHA class 2 ([place])   . Non-ST elevation (NSTEMI) myocardial infarction ([place]) 07/28/2018  . Respiratory failure with hypoxia ([place]) 07/28/2018  . Acute heart failure ([place]) 07/28/2018  . Hypertensive emergency 07/28/2018  . S/P BKA (below knee amputation) unilateral, right ([place]) 07/15/2018  . Hypoglycemia   . Anxiety about health   . Labile blood glucose   . Essential hypertension   . History of transmetatarsal amputation of left foot ([place])   . Hypoalbuminemia due to protein-calorie malnutrition ([place])   . Stage 3 chronic kidney disease ([place])   . Poorly controlled type 2 diabetes mellitus with  peripheral neuropathy ([place])   . Acute blood loss anemia   . Anemia of chronic disease   . Leukocytosis   . Tobacco abuse   . Hyperkalemia 06/20/2018  . Diabetic polyneuropathy associated with type 2 diabetes mellitus ([place])   . Severe protein-calorie malnutrition ([place])   . AKI (acute kidney injury) ([place]) 06/17/2018  . Normocytic anemia 06/17/2018  . DM2 (diabetes mellitus, type 2) ([place]) 05/14/2018  . Viral pharyngitis 01/14/2017  . Cigarette smoker 01/14/2017  . Avascular necrosis of hip, left ([place]) 10/11/2016  . Status post left hip replacement 10/11/2016  . S/P transmetatarsal amputation of foot, left ([place]) 07/25/2016  . Balance problem 07/25/2016  . Fournier's gangrene 08/16/2011  . Acute respiratory failure ([place]) 08/16/2011  . Septic shock(785.52) 08/16/2011  . Insulin dependent diabetes mellitus with complications ([place]) 86/16/8372    Bjorn Loser, PTA  04/22/19, 5:37 PM [facility] [address] [place], [place], [address] Phone: [phone]   Fax:  [phone]  Name: NAMON VILLARIN MRN: [id number] Date of Birth: Sep 22, 1970

## 2019-04-23 ENCOUNTER — Encounter: Payer: Self-pay | Admitting: Cardiology

## 2019-04-27 ENCOUNTER — Telehealth: Payer: Self-pay

## 2019-04-27 ENCOUNTER — Encounter: Payer: Self-pay | Admitting: *Deleted

## 2019-04-27 ENCOUNTER — Telehealth (INDEPENDENT_AMBULATORY_CARE_PROVIDER_SITE_OTHER): Payer: Medicare Other | Admitting: Cardiology

## 2019-04-27 ENCOUNTER — Encounter: Payer: Self-pay | Admitting: Cardiology

## 2019-04-27 VITALS — BP 143/94 | HR 84 | Ht 72.0 in | Wt 245.0 lb

## 2019-04-27 DIAGNOSIS — I252 Old myocardial infarction: Secondary | ICD-10-CM

## 2019-04-27 DIAGNOSIS — I2510 Atherosclerotic heart disease of native coronary artery without angina pectoris: Secondary | ICD-10-CM

## 2019-04-27 DIAGNOSIS — IMO0001 Reserved for inherently not codable concepts without codable children: Secondary | ICD-10-CM

## 2019-04-27 DIAGNOSIS — I5043 Acute on chronic combined systolic (congestive) and diastolic (congestive) heart failure: Secondary | ICD-10-CM

## 2019-04-27 DIAGNOSIS — Z9861 Coronary angioplasty status: Secondary | ICD-10-CM

## 2019-04-27 DIAGNOSIS — N183 Chronic kidney disease, stage 3 unspecified: Secondary | ICD-10-CM

## 2019-04-27 DIAGNOSIS — I739 Peripheral vascular disease, unspecified: Secondary | ICD-10-CM

## 2019-04-27 DIAGNOSIS — Z006 Encounter for examination for normal comparison and control in clinical research program: Secondary | ICD-10-CM

## 2019-04-27 NOTE — Telephone Encounter (Signed)
Contacted patient to discuss AVS Instructions. Gave patient Luke's recommendations from today's virtual office visit. Informed patient that someone from the scheduling dept will be in contact with them to schedule their follow up appt. Patient voiced understanding and AVS mailed.

## 2019-04-27 NOTE — Patient Instructions (Signed)
Medication Instructions:  Your physician recommends that you continue on your current medications as directed. Please refer to the Current Medication list given to you today. If you need a refill on your cardiac medications before your next appointment, please call your pharmacy.   Lab work: Your physician recommends that you return for lab work in: [employer], BNP If you have labs (blood work) drawn today and your tests are completely normal, you will receive your results only by: Marland Kitchen MyChart Message (if you have MyChart) OR . A paper copy in the mail If you have any lab test that is abnormal or we need to change your treatment, we will call you to review the results.  Testing/Procedures: NONE   Follow-Up: At [facility], you and your health needs are our priority.  As part of our continuing mission to provide you with exceptional heart care, we have created designated Provider Care Teams.  These Care Teams include your primary Cardiologist (physician) and Advanced Practice Providers (APPs -  Physician Assistants and Nurse Practitioners) who all work together to provide you with the care you need, when you need it. . Your physician recommends that you schedule a follow-up appointment in: 3 MONTHS with Kerin Ransom, PA-C  Any Other Special Instructions Will Be Listed Below (If Applicable).

## 2019-04-27 NOTE — Progress Notes (Signed)
Virtual Visit via Video Note   This visit type was conducted due to national recommendations for restrictions regarding the COVID-19 Pandemic (e.g. social distancing) in an effort to limit this patient's exposure and mitigate transmission in our community.  Due to his co-morbid illnesses, this patient is at least at moderate risk for complications without adequate follow up.  This format is felt to be most appropriate for this patient at this time.  All issues noted in this document were discussed and addressed.  A limited physical exam was performed with this format.  Please refer to the patient's chart for his consent to telehealth for [facility].   Date:  04/27/2019   ID:  [name], DOB 04-19-1971, MRN [id number]  Patient Location: Home Provider Location: Home  PCP:  [name], MD  Cardiologist:  Kirk Ruths, MD  Electrophysiologist:  None   Evaluation Performed:  Follow-Up Visit  Chief Complaint:  none  History of Present Illness:    Edward [name] is a 48 y.o.AA male  With IDDM, CRI-3, and PVD who was seen in the hospital December 2019 with acute pulmonary edema after he was discharged from rehab s/p Rt BKA.  He has aslo had prior left metatarsal amputation. He ruled in for a NSTEMI.  Echocardiogram revealed an EF of 40 to 45% with moderate LVH and moderate mitral regurgitation.  Catheterization done 07/29/2018 revealed an 85% mid circumflex stenosis, 90% distal LAD, severe small first and second diagonal disease and septal perforator disease which was also small.  The patient underwent circumflex intervention with DES. He has had some issues with volume overload since.  F/U echo in May 2020 showed his EF to be 45-50% with severe LAE and moderate MR.   Dr Stanford Breed saw him 03/18/2019 and increased his Lasix to 60 mg BID though the patient tells me he is taking 80 mg BID.  He was supposed to have a BMP but has not done this yet.  Overall he says he is better, less SOB.  He  has trouble weighing himself at home but thinks his baseline weight is close to 240 lbs.    The patient does not have symptoms concerning for COVID-19 infection (fever, chills, cough, or new shortness of breath).    Past Medical History:  Diagnosis Date   Anxiety    self reported   Arthritis    CHF (congestive heart failure) ([place])    Coronary artery disease    Depression    self reported   Diabetes mellitus    Hypertension    Past Surgical History:  Procedure Laterality Date   AMPUTATION Left 05/10/2016   Procedure: Left Transmetatarsal Amputation;  Surgeon: [name], MD;  Location: [facility];  Service: Orthopedics;  Laterality: Left;   AMPUTATION Right 05/16/2018   Procedure: PARTIAL RAY AMPUTATION FIFTH TOE RIGHT FOOT;  Surgeon: [name], DPM;  Location: [facility];  Service: Podiatry;  Laterality: Right;   AMPUTATION Right 06/17/2018   Procedure: AMPUTATION 4th RAY Right Foot;  Surgeon: Trula Slade, DPM;  Location: [facility];  Service: Podiatry;  Laterality: Right;   AMPUTATION Right 06/19/2018   Procedure: RIGHT BELOW KNEE AMPUTATION;  Surgeon: [name], MD;  Location: [facility];  Service: Orthopedics;  Laterality: Right;   APPLICATION OF WOUND VAC Right 06/19/2018   Procedure: APPLICATION OF WOUND VAC;  Surgeon: [name], MD;  Location: [facility];  Service: Orthopedics;  Laterality: Right;   CARDIAC CATHETERIZATION  07/29/2018   CIRCUMCISION  09/02/2011   Procedure: CIRCUMCISION ADULT;  Surgeon: Molli Hazard, MD;  Location: [facility];  Service: Urology;  Laterality: N/A;   CORONARY STENT INTERVENTION N/A 07/29/2018   Procedure: CORONARY STENT INTERVENTION;  Surgeon: Leonie Man, MD;  Location: [facility];  Service: Cardiovascular;  Laterality: N/A;   I&D EXTREMITY  09/02/2011   Procedure: IRRIGATION AND DEBRIDEMENT EXTREMITY;  Surgeon: Theodoro Kos, DO;  Location: [facility];  Service: Plastics;   Laterality: N/A;   INCISION AND DRAINAGE OF WOUND  08/12/2011   Procedure: IRRIGATION AND DEBRIDEMENT WOUND;  Surgeon: Zenovia Jarred, MD;  Location: [facility];  Service: General;;  perineum   IRRIGATION AND DEBRIDEMENT ABSCESS  08/12/2011   Procedure: IRRIGATION AND DEBRIDEMENT ABSCESS;  Surgeon: Molli Hazard, MD;  Location: [facility];  Service: Urology;  Laterality: N/A;  irrigation and debridment perineum     IRRIGATION AND DEBRIDEMENT ABSCESS  08/14/2011   Procedure: MINOR INCISION AND DRAINAGE OF ABSCESS;  Surgeon: Molli Hazard, MD;  Location: [facility];  Service: Urology;  Laterality: N/A;  Perineal Wound Debridement;Placement Bilateral Testicular Thigh Pouches   LEFT HEART CATH AND CORONARY ANGIOGRAPHY N/A 07/29/2018   Procedure: LEFT HEART CATH AND CORONARY ANGIOGRAPHY;  Surgeon: Leonie Man, MD;  Location: [facility];  Service: Cardiovascular;  Laterality: N/A;   TOTAL HIP ARTHROPLASTY Left 10/11/2016   Procedure: LEFT TOTAL HIP ARTHROPLASTY ANTERIOR APPROACH;  Surgeon: Mcarthur Rossetti, MD;  [facility];  Service: Orthopedics;  Laterality: Left;     Current Meds  Medication Sig   acetaminophen (TYLENOL) 325 MG tablet Take 1-2 tablets (325-650 mg total) by mouth every 6 (six) hours as needed for mild pain (pain score 1-3 or temp > 100.5).   ALPRAZolam (XANAX) 0.25 MG tablet Take 1 tablet (0.25 mg total) by mouth 3 (three) times daily as needed for anxiety.   aspirin EC 81 MG EC tablet Take 1 tablet (81 mg total) by mouth daily.   blood glucose meter kit and supplies Dispense based on patient and insurance preference. Use up to four times daily as directed. (FOR ICD-9 250.00, 250.01).   DULoxetine (CYMBALTA) 30 MG capsule Take 1 capsule (30 mg total) by mouth daily.   furosemide (LASIX) 40 MG tablet Take 2 tablets (80 mg total) by mouth 2 (two) times daily.   hydrALAZINE (APRESOLINE) 50 MG tablet Take 1.5 tablets (75 mg total) by mouth every 8  (eight) hours.   insulin aspart protamine - aspart (NOVOLOG MIX 70/30 FLEXPEN) (70-30) 100 UNIT/ML FlexPen Inject 0.15 mLs (15 Units total) into the skin 2 (two) times daily with a meal. (Patient taking differently: Inject 15 Units into the skin 2 (two) times daily as needed (high blood sugar). )   Insulin Pen Needle 31G X 5 MM MISC Use daily to inject insulin as instructed   isosorbide mononitrate (IMDUR) 30 MG 24 hr tablet Take 1 tablet (30 mg total) by mouth daily.   metoprolol succinate (TOPROL-XL) 50 MG 24 hr tablet Take 1 tablet (50 mg total) by mouth daily. Take with or immediately following a meal.   Multiple Vitamin (MULTIVITAMIN WITH MINERALS) TABS tablet Take 1 tablet by mouth daily.   sitaGLIPtin-metformin (JANUMET) 50-1000 MG tablet Take 1 tablet by mouth daily.   ticagrelor (BRILINTA) 90 MG TABS tablet Take 90 mg by mouth 2 (two) times daily.     Allergies:   Patient has no known  allergies.   Social History   Tobacco Use   Smoking status: Current Every Day Smoker    Types: Cigarettes   Smokeless tobacco: Never Used  Substance Use Topics   Alcohol use: Yes    Alcohol/week: 0.0 standard drinks    Comment: has not had alcohol in a month   Drug use: No     Family Hx: The patient's family history includes Diabetes in an other family member.  ROS:   Please see the history of present illness.    All other systems reviewed and are negative.   Prior CV studies:   The following studies were reviewed today: Echo May 2020  Labs/Other Tests and Data Reviewed:    EKG:  No ECG reviewed.  Recent Labs: 07/28/2018: Magnesium 1.8; TSH 1.140 12/21/2018: ALT 16; B Natriuretic Peptide 631.7 12/23/2018: Hemoglobin 12.8; Platelets 248 02/09/2019: BUN 32; Creatinine, Ser 2.03; NT-Pro BNP 3,841; Potassium 4.3; Sodium 138   Recent Lipid Panel Lab Results  Component Value Date/Time   CHOL 194 10/16/2018 12:09 PM   TRIG 82 10/16/2018 12:09 PM   HDL 44 10/16/2018 12:09 PM    CHOLHDL 4.4 10/16/2018 12:09 PM   CHOLHDL 4.8 07/28/2018 07:21 AM   LDLCALC 134 (H) 10/16/2018 12:09 PM    Wt Readings from Last 3 Encounters:  04/27/19 245 lb (111.1 kg)  03/18/19 242 lb (109.8 kg)  02/09/19 240 lb 3.2 oz (109 kg)     Objective:    Vital Signs:  BP (!) 143/94    Pulse 84    Ht 6' (1.829 m)    Wt 245 lb (111.1 kg)    BMI 33.23 kg/m    VITAL SIGNS:  reviewed  ASSESSMENT & PLAN:    Acute on chronic combined systolic and diastolic CHF, NYHA class 2 ([place]) Pt seen by cardiology Dec 2019 for CHF with NSTEMI (no chest pain) EF 45-50% May 2020- Lasix increased 03/18/2019  Non-ST elevation (NSTEMI) myocardial infarction [facility]) !09/28/17- Troponin 2.8 (no chest pain)  Stage 3 chronic kidney disease (HCC) SCr 2.0- GFR 37 02/09/2019  PVD (peripheral vascular disease) ([place]) S/P Rt BKA Nov 2019  Insulin dependent diabetes mellitus with complications (HCC) CRI, PVD  Cardiomyopathy ([place]) Probably mixed secondary to uncontrolled HTN and CAD  Moderate MR  Plan: check BNP and BMP- f/u 3 months.  I will see if he is followed by a Nephrologist.   COVID-19 Education: The signs and symptoms of COVID-19 were discussed with the patient and how to seek care for testing (follow up with PCP or arrange E-visit).  The importance of social distancing was discussed today.  Time:   Today, I have spent 20 minutes with the patient with telehealth technology discussing the above problems.     Medication Adjustments/Labs and Tests Ordered: Current medicines are reviewed at length with the patient today.  Concerns regarding medicines are outlined above.   Tests Ordered: No orders of the defined types were placed in this encounter.   Medication Changes: No orders of the defined types were placed in this encounter.   Follow Up:  In Person Kerin Ransom in person OV 3 months.   Angelena Form, PA-C  04/27/2019 11:06 AM    [facility]

## 2019-04-27 NOTE — Research (Signed)
V10  Pt doing well, having some swelling in his ankles but the MD are adjusting his meds to help. Doing therapy and its going good.  Patient has not been taking atorvastatin due to it was d/c sent note to Dr Stanford Breed and Kerin Ransom PA just to confirm.                                    "CONSENT"   YES     NO   Continuing further Investigational Product and study visits for follow-up? [x]  []   Continuing consent from future biomedical research [x]  []                                    "EVENTS"    YES     NO  AE   (IF YES SEE SOURCE) []  [x]   SAE  (IF YES SEE SOURCE) []  [x]   ENDPOINT   (IF YES SEE SOURCE) []  [x]   REVASCULARIZATION  (IF YES SEE SOURCE) []  [x]   AMPUTATION   (IF YES SEE SOURCE) []  [x]   TROPONIN'S  (IF YES SEE SOURCE) []  [x]     Current Outpatient Medications:  .  acetaminophen (TYLENOL) 325 MG tablet, Take 1-2 tablets (325-650 mg total) by mouth every 6 (six) hours as needed for mild pain (pain score 1-3 or temp > 100.5)., Disp: , Rfl:  .  ALPRAZolam (XANAX) 0.25 MG tablet, Take 1 tablet (0.25 mg total) by mouth 3 (three) times daily as needed for anxiety., Disp: 30 tablet, Rfl: 0 .  aspirin EC 81 MG EC tablet, Take 1 tablet (81 mg total) by mouth daily., Disp: 90 tablet, Rfl: 3 .  blood glucose meter kit and supplies, Dispense based on patient and insurance preference. Use up to four times daily as directed. (FOR ICD-9 250.00, 250.01)., Disp: 1 each, Rfl: 0 .  DULoxetine (CYMBALTA) 30 MG capsule, Take 1 capsule (30 mg total) by mouth daily., Disp: 30 capsule, Rfl: 1 .  furosemide (LASIX) 40 MG tablet, Take 2 tablets (80 mg total) by mouth 2 (two) times daily., Disp: 270 tablet, Rfl: 3 .  hydrALAZINE (APRESOLINE) 50 MG tablet, Take 1.5 tablets (75 mg total) by mouth every 8 (eight) hours., Disp: 405 tablet, Rfl: 1 .  insulin aspart protamine - aspart (NOVOLOG MIX 70/30 FLEXPEN) (70-30) 100 UNIT/ML FlexPen, Inject 0.15 mLs (15 Units total) into the skin 2 (two) times daily with a  meal. (Patient taking differently: Inject 15 Units into the skin 2 (two) times daily as needed (high blood sugar). ), Disp: 15 mL, Rfl: 11 .  Insulin Pen Needle 31G X 5 MM MISC, Use daily to inject insulin as instructed, Disp: 100 each, Rfl: 2 .  isosorbide mononitrate (IMDUR) 30 MG 24 hr tablet, Take 1 tablet (30 mg total) by mouth daily., Disp: 90 tablet, Rfl: 3 .  metoprolol succinate (TOPROL-XL) 50 MG 24 hr tablet, Take 1 tablet (50 mg total) by mouth daily. Take with or immediately following a meal., Disp: 90 tablet, Rfl: 3 .  Multiple Vitamin (MULTIVITAMIN WITH MINERALS) TABS tablet, Take 1 tablet by mouth daily., Disp: 90 tablet, Rfl: 3 .  sitaGLIPtin-metformin (JANUMET) 50-1000 MG tablet, Take 1 tablet by mouth daily., Disp: , Rfl:  .  ticagrelor (BRILINTA) 90 MG TABS tablet, Take 90 mg by mouth 2 (two) times daily., Disp: , Rfl:

## 2019-04-28 MED ORDER — ATORVASTATIN CALCIUM 80 MG PO TABS: 80.0000 mg | ORAL_TABLET | Freq: Every day | ORAL | 3 refills | Status: DC

## 2019-04-28 MED FILL — ATORVASTATIN 80 MG TABLET: 80 | 90 days supply | Qty: 90 | Fill #0

## 2019-04-28 NOTE — Addendum Note (Signed)
Addended by: Ulice Brilliant T on: 04/28/2019 08:55 AM   Modules accepted: Orders

## 2019-04-29 ENCOUNTER — Ambulatory Visit: Payer: Medicare Other | Admitting: Physical Therapy

## 2019-04-29 ENCOUNTER — Telehealth: Payer: Self-pay | Admitting: Physical Therapy

## 2019-04-29 NOTE — Telephone Encounter (Signed)
Called pt to recommend pt call front office to schedule more appointments tomorrow 04/30/19. Bjorn Loser, PTA  04/29/19, 7:45 PM

## 2019-04-30 ENCOUNTER — Encounter

## 2019-04-30 LAB — BASIC METABOLIC PANEL
BUN/Creatinine Ratio: 15 (ref 9–20)
BUN: 38 mg/dL — ABNORMAL HIGH (ref 6–24)
CO2: 19 mmol/L — ABNORMAL LOW (ref 20–29)
Calcium: 8.5 mg/dL — ABNORMAL LOW (ref 8.7–10.2)
Chloride: 108 mmol/L — ABNORMAL HIGH (ref 96–106)
Creatinine, Ser: 2.47 mg/dL — ABNORMAL HIGH (ref 0.76–1.27)
GFR calc Af Amer: 34 mL/min/{1.73_m2} — ABNORMAL LOW (ref >59)
GFR calc non Af Amer: 29 mL/min/{1.73_m2} — ABNORMAL LOW (ref >59)
Glucose: 145 mg/dL — ABNORMAL HIGH (ref 65–99)
Potassium: 4.9 mmol/L (ref 3.5–5.2)
Sodium: 140 mmol/L (ref 134–144)

## 2019-04-30 LAB — PRO B NATRIURETIC PEPTIDE: NT-Pro BNP: 3766 pg/mL — ABNORMAL HIGH (ref 0–121)

## 2019-05-01 LAB — COMPREHENSIVE METABOLIC PANEL
ALT: 23 IU/L (ref 0–44)
AST: 15 IU/L (ref 0–40)
Albumin/Globulin Ratio: 1.6 (ref 1.2–2.2)
Albumin: 3.7 g/dL — ABNORMAL LOW (ref 4.0–5.0)
Alkaline Phosphatase: 73 IU/L (ref 39–117)
BUN/Creatinine Ratio: 14 (ref 9–20)
BUN: 37 mg/dL — ABNORMAL HIGH (ref 6–24)
Bilirubin Total: 0.3 mg/dL (ref 0.0–1.2)
CO2: 18 mmol/L — ABNORMAL LOW (ref 20–29)
Calcium: 8.1 mg/dL — ABNORMAL LOW (ref 8.7–10.2)
Chloride: 109 mmol/L — ABNORMAL HIGH (ref 96–106)
Creatinine, Ser: 2.6 mg/dL — ABNORMAL HIGH (ref 0.76–1.27)
GFR calc Af Amer: 32 mL/min/{1.73_m2} — ABNORMAL LOW (ref >59)
GFR calc non Af Amer: 28 mL/min/{1.73_m2} — ABNORMAL LOW (ref >59)
Globulin, Total: 2.3 g/dL (ref 1.5–4.5)
Glucose: 134 mg/dL — ABNORMAL HIGH (ref 65–99)
Potassium: 5 mmol/L (ref 3.5–5.2)
Sodium: 140 mmol/L (ref 134–144)
Total Protein: 6 g/dL (ref 6.0–8.5)

## 2019-05-01 LAB — LIPID PANEL
Chol/HDL Ratio: 4.6 ratio (ref 0.0–5.0)
Cholesterol, Total: 192 mg/dL (ref 100–199)
HDL: 42 mg/dL (ref >39)
LDL Chol Calc (NIH): 138 mg/dL — ABNORMAL HIGH (ref 0–99)
Triglycerides: 66 mg/dL (ref 0–149)
VLDL Cholesterol Cal: 12 mg/dL (ref 5–40)

## 2019-05-03 ENCOUNTER — Other Ambulatory Visit: Payer: Self-pay

## 2019-05-03 DIAGNOSIS — N183 Chronic kidney disease, stage 3 unspecified: Secondary | ICD-10-CM

## 2019-05-03 DIAGNOSIS — E7800 Pure hypercholesterolemia, unspecified: Secondary | ICD-10-CM

## 2019-05-03 MED ORDER — EZETIMIBE 10 MG PO TABS: 10.0000 mg | ORAL_TABLET | Freq: Every day | ORAL | 3 refills | Status: DC

## 2019-05-03 MED FILL — EZETIMIBE 10 MG TABS: 10 | 90 days supply | Qty: 90 | Fill #0

## 2019-05-05 ENCOUNTER — Telehealth: Payer: Self-pay

## 2019-05-05 NOTE — Telephone Encounter (Addendum)
Left a message for the patient to give our office a call back to go over his lab results and to get him scheduled for an appointment.   ----- Message from Erlene Quan, PA-C sent at 05/04/2019 12:56 PM EDT ----- Please let the patient know his labs show some decrease in his kidney function.  If he has a nephrologist please forward results.  If he does not please get him a referral for worsening renal function.  His BNP is elevated but this has been since June- no real change.  If he is feeling well same Rx- f/u with Dr Stanford Breed in 2-3 months, if not schedule him an appointment with me.  Kerin Ransom PA-C 05/04/2019 12:55 PM

## 2019-05-07 ENCOUNTER — Encounter: Payer: Self-pay | Admitting: Physical Therapy

## 2019-05-07 ENCOUNTER — Other Ambulatory Visit: Payer: Self-pay

## 2019-05-07 ENCOUNTER — Ambulatory Visit: Payer: Medicare Other | Admitting: Physical Therapy

## 2019-05-07 VITALS — BP 156/94 | HR 84

## 2019-05-07 DIAGNOSIS — R2689 Other abnormalities of gait and mobility: Secondary | ICD-10-CM

## 2019-05-07 DIAGNOSIS — R2681 Unsteadiness on feet: Secondary | ICD-10-CM

## 2019-05-07 DIAGNOSIS — R293 Abnormal posture: Secondary | ICD-10-CM

## 2019-05-07 DIAGNOSIS — M6281 Muscle weakness (generalized): Secondary | ICD-10-CM

## 2019-05-08 NOTE — Therapy (Signed)
[facility] [address] [place], [place], [address] Phone: [phone]   Fax:  [phone]  Physical Therapy Treatment  Patient Details  Name: Edward [name] MRN: [id number] Date of Birth: July 30, 1971 Referring Provider (PT): [name], [place]   Encounter Date: 05/07/2019   05/07/19 1022  PT Visits / Re-Eval  Visit Number 14 (visit correction from last visit.)  Number of Visits 23  Date for PT Re-Evaluation 05/23/19  Authorization  Authorization Type Medicaid  Authorization Time Period 3 visits 01/21/2019 - 02/10/2019; 12 visits from 02/15/19 to 03/28/2019; 12 more visits between 04/12/2019 - 05/23/2019  Authorization - Visit Number 3  Authorization - Number of Visits 12  PT Time Calculation  PT Start Time 1020  PT Stop Time 1059  PT Time Calculation (min) 39 min  PT - End of Session  Equipment Utilized During Treatment Gait belt  Activity Tolerance Patient tolerated treatment well;No increased pain;Patient limited by fatigue (elevated BP)  Behavior During Therapy [facility] for tasks assessed/performed     Past Medical History:  Diagnosis Date  . Anxiety    self reported  . Arthritis   . CHF (congestive heart failure) ([place])   . Coronary artery disease   . Depression    self reported  . Diabetes mellitus   . Hypertension     Past Surgical History:  Procedure Laterality Date  . AMPUTATION Left 05/10/2016   Procedure: Left Transmetatarsal Amputation;  Surgeon: [name], MD;  Location: [facility];  Service: Orthopedics;  Laterality: Left;  . AMPUTATION Right 05/16/2018   Procedure: PARTIAL RAY AMPUTATION FIFTH TOE RIGHT FOOT;  Surgeon: [name], DPM;  Location: [facility];  Service: Podiatry;  Laterality: Right;  . AMPUTATION Right 06/17/2018   Procedure: AMPUTATION 4th RAY Right Foot;  Surgeon: Trula Slade, DPM;  Location: [facility];  Service: Podiatry;  Laterality: Right;  . AMPUTATION Right 06/19/2018    Procedure: RIGHT BELOW KNEE AMPUTATION;  Surgeon: [name], MD;  Location: [facility];  Service: Orthopedics;  Laterality: Right;  . APPLICATION OF WOUND VAC Right 06/19/2018   Procedure: APPLICATION OF WOUND VAC;  Surgeon: [name], MD;  Location: [facility];  Service: Orthopedics;  Laterality: Right;  . CARDIAC CATHETERIZATION  07/29/2018  . CIRCUMCISION  09/02/2011   Procedure: CIRCUMCISION ADULT;  Surgeon: Molli Hazard, MD;  Location: [facility];  Service: Urology;  Laterality: N/A;  . CORONARY STENT INTERVENTION N/A 07/29/2018   Procedure: CORONARY STENT INTERVENTION;  Surgeon: Leonie Man, MD;  Location: [facility];  Service: Cardiovascular;  Laterality: N/A;  . I&D EXTREMITY  09/02/2011   Procedure: IRRIGATION AND DEBRIDEMENT EXTREMITY;  Surgeon: Theodoro Kos, DO;  Location: [facility];  Service: Plastics;  Laterality: N/A;  . INCISION AND DRAINAGE OF WOUND  08/12/2011   Procedure: IRRIGATION AND DEBRIDEMENT WOUND;  Surgeon: Zenovia Jarred, MD;  Location: [facility];  Service: General;;  perineum  . IRRIGATION AND DEBRIDEMENT ABSCESS  08/12/2011   Procedure: IRRIGATION AND DEBRIDEMENT ABSCESS;  Surgeon: Molli Hazard, MD;  Location: [facility];  Service: Urology;  Laterality: N/A;  irrigation and debridment perineum    . IRRIGATION AND DEBRIDEMENT ABSCESS  08/14/2011   Procedure: MINOR INCISION AND DRAINAGE OF ABSCESS;  Surgeon: Molli Hazard, MD;  Location: [facility];  Service: Urology;  Laterality: N/A;  Perineal Wound Debridement;Placement Bilateral Testicular Thigh Pouches  . LEFT HEART CATH AND CORONARY ANGIOGRAPHY N/A  07/29/2018   Procedure: LEFT HEART CATH AND CORONARY ANGIOGRAPHY;  Surgeon: Leonie Man, MD;  Location: [facility];  Service: Cardiovascular;  Laterality: N/A;  . TOTAL HIP ARTHROPLASTY Left 10/11/2016   Procedure: LEFT TOTAL HIP ARTHROPLASTY ANTERIOR APPROACH;  Surgeon: Mcarthur Rossetti, MD;   Location: [facility];  Service: Orthopedics;  Laterality: Left;    Vitals:   05/07/19 1020 05/07/19 1044  BP: (!) 148/95 (!) 156/94  Pulse: 82 84     05/07/19 1020  Symptoms/Limitations  Subjective Reports the leg swelling is getting better. Cardiology increased his lasix dosage. Also to start cholesterol medicine, has been called in, just has not started it yet. No falls or pain to report. Has not taken BP meds today.  Pertinent History R TTA, L THA 10/11/2016, L transmetarsal amp 2017, CAD, CHF, MI, arthritis, DM, HTN, CKD stage III  Limitations Lifting;Walking;Standing;House hold activities  Patient Stated Goals to use prosthesis for balance & walking.   Pain Assessment  Currently in Pain? No/denies  Pain Score 0      05/07/19 1029  Transfers  Transfers Sit to Stand;Stand to Sit  Sit to Stand 6: Modified independent (Device/Increase time);With upper extremity assist;From bed;From chair/3-in-1  Stand to Sit 6: Modified independent (Device/Increase time);With upper extremity assist;To bed;To chair/3-in-1  Ambulation/Gait  Ambulation/Gait Yes  Ambulation/Gait Assistance 5: Supervision  Ambulation/Gait Assistance Details pt with fatigue and mild shortness of breath after 110 feet. SaO2 91%, recovered quickly to 99% within 45-60 sec's sitting down to rest.  decreased to 91% with second lap as well, recovered to >/=99% within   Ambulation Distance (Feet) 210 Feet (x1)  Assistive device Straight cane;Prosthesis  Gait Pattern Step-through pattern;Decreased step length - left;Decreased stance time - right;Decreased stride length;Decreased hip/knee flexion - right;Decreased weight shift to right;Right hip hike;Antalgic;Lateral hip instability;Trunk flexed;Abducted- right  Ambulation Surface Level;Indoor  Ramp Other (comment) (min guard assist)  Ramp Details (indicate cue type and reason) with cane/prosthesis x 2 reps, cues on posture, sequencing and weight shifting.  Curb 5: Supervision   Curb Details (indicate cue type and reason) with cane/prosthesis for 1 reps, cues for cane placement and stance position to allow for improved weight shifting.   Knee/Hip Exercises: Aerobic  Other Aerobic Scifit level 2.0 with UE/LE with goal >/=30 rpm for 1 minute work then 1 minute rest for 8-9 total minutes. SaO2 96% afterwards.  Prosthetics  Prosthetic Care Comments  Anda Latina the other day who made some adjustments to it (not sure what was done) and prosthesis feels good/no issues.   Current prosthetic wear tolerance (days/week)  daily  Current prosthetic wear tolerance (#hours/day)  most awake hours, drying as needed  Residual limb condition  intact with no issues per pt report  Donning Prosthesis 6  Doffing Prosthesis 6       PT Short Term Goals - 04/16/19 2116      PT SHORT TERM GOAL #1   Title  = LTG        PT Long Term Goals - 04/16/19 2116      PT LONG TERM GOAL #1   Title  Patient is independent with all aspects of prosthetic care to enable safe utilization of the prosthesis    Baseline  03/26/19: pt continues to need min cues/guidance on new aspects related to prosthetic care    Time  12    Period  --   visits   Status  On-going    Target Date  05/23/19  PT LONG TERM GOAL #2   Title  patient tolerates wear prosthesis daily for greater than 90% of awake hours without skin integrity problems or limb discomfort to enable function throughout his day     Baseline  03/26/19: pt currently wearing up to 10-12 hours a day with pain with prolonged wear    Time  12    Period  --   visits   Status  On-going    Target Date  05/23/19      PT LONG TERM GOAL #3   Title  Berg balance greater than 45 /56 to indicate lower fall risk and less dependency in standing ADLs    Baseline  03/26/19: scored 35/56. revised LTG due to improvement from  14/56 to 35/56 (original goal 36/56) per PT instruction.    Time  12    Period  --   visits   Status  Revised    Target Date   05/23/19      PT LONG TERM GOAL #4   Title  patient ambulates 500 feet with LRAD and prosthesis modified independent to enable community access     Baseline  03/26/19: pt able to ambulate 130 feet with straight cane with min guard assist.    Time  12    Period  --   visits   Status  On-going    Target Date  05/23/19      PT LONG TERM GOAL #5   Title  patient negotiates ramps & curbs with LRAD and stairs with one rail and the prosthesis modified independent to enable community access     Baseline  03/26/19: min assist with cane on ramps/curbs with prosthesis.    Time  12    Period  --   visits   Status  On-going    Target Date  05/23/19          05/07/19 1023  Plan  Clinical Impression Statement Today's skilled session focused on gait/barriers with prosthesis/cane with rest breaks needed due to fatigue. Also continued to work on activity tolerance/strengthening on Scifit as well. The pt fatigues quickly with all activity with decreaed in SaO2 noted, however does recover with rest breaks. The pt is making slow progress toward goals and should benefit from continued PT to progress toward unmet goals.  Personal Factors and Comorbidities Comorbidity 3+;Fitness;Time since onset of injury/illness/exacerbation  Comorbidities R TTA, L THA 10/11/2016, L transmetarsal amp 2017, CAD, CHF, MI, arthritis, DM2, HTN, CKD stage III  Examination-Activity Limitations Caring for Others;Carry;Locomotion Level;Reach Overhead;Stairs;Stand;Transfers  Examination-Participation Restrictions Community Activity;Driving;Personal Finances  Pt will benefit from skilled therapeutic intervention in order to improve on the following deficits Abnormal gait;Decreased activity tolerance;Decreased balance;Decreased endurance;Decreased knowledge of use of DME;Decreased mobility;Decreased skin integrity;Decreased strength;Prosthetic Dependency;Postural dysfunction;Pain  Stability/Clinical Decision Making Evolving/Moderate  complexity  Rehab Potential Good  PT Frequency 2x / week (1x/wk for 3 weeks, then 2x/wk for 6 weeks)  PT Duration 6 weeks (9 weeks)  PT Treatment/Interventions ADLs/Self Care Home Management;DME Instruction;Gait training;Stair training;Functional mobility training;Canalith Repostioning;Therapeutic activities;Therapeutic exercise;Balance training;Neuromuscular re-education;Patient/family education;Prosthetic Training;Manual techniques;Vestibular  PT Next Visit Plan Monitor BP.  Continue to work on balance, endurance for gait > 500' with cane, obstacles/ramp/curb with cane  PT Home Exercise Plan Access Code: [email or web address] & Amputee sink HEP  Consulted and Agree with Plan of Care Patient         Patient will benefit from skilled therapeutic intervention in order to improve the following deficits and impairments:  Abnormal gait, Decreased  activity tolerance, Decreased balance, Decreased endurance, Decreased knowledge of use of DME, Decreased mobility, Decreased skin integrity, Decreased strength, Prosthetic Dependency, Postural dysfunction, Pain  Visit Diagnosis: Other abnormalities of gait and mobility  Unsteadiness on feet  Muscle weakness (generalized)  Abnormal posture     Problem List Patient Active Problem List   Diagnosis Date Noted  . Elevated troponin 12/22/2018  . Acute exacerbation of CHF (congestive heart failure) ([place]) 12/21/2018  . Gait abnormality 09/11/2018  . CAD S/P percutaneous coronary angioplasty 08/06/2018  . Cardiomyopathy ([place]) 08/06/2018  . PVD (peripheral vascular disease) ([place]) 08/06/2018  . Shortness of breath   . Acute on chronic combined systolic and diastolic CHF, NYHA class 2 ([place])   . History of NSTEMI 07/28/2018  . Respiratory failure with hypoxia ([place]) 07/28/2018  . Acute heart failure ([place]) 07/28/2018  . Hypertensive emergency 07/28/2018  . S/P BKA (below knee amputation) unilateral, right ([place]) 07/15/2018  . Hypoglycemia   . Anxiety about  health   . Labile blood glucose   . Essential hypertension   . History of transmetatarsal amputation of left foot ([place])   . Hypoalbuminemia due to protein-calorie malnutrition ([place])   . Stage 3 chronic kidney disease ([place])   . Poorly controlled type 2 diabetes mellitus with peripheral neuropathy ([place])   . Acute blood loss anemia   . Anemia of chronic disease   . Leukocytosis   . Tobacco abuse   . Hyperkalemia 06/20/2018  . Diabetic polyneuropathy associated with type 2 diabetes mellitus ([place])   . Severe protein-calorie malnutrition ([place])   . AKI (acute kidney injury) ([place]) 06/17/2018  . Normocytic anemia 06/17/2018  . DM2 (diabetes mellitus, type 2) ([place]) 05/14/2018  . Viral pharyngitis 01/14/2017  . Cigarette smoker 01/14/2017  . Avascular necrosis of hip, left ([place]) 10/11/2016  . Status post left hip replacement 10/11/2016  . S/P transmetatarsal amputation of foot, left ([place]) 07/25/2016  . Balance problem 07/25/2016  . Fournier's gangrene 08/16/2011  . Acute respiratory failure ([place]) 08/16/2011  . Septic shock(785.52) 08/16/2011  . Insulin dependent diabetes mellitus with complications ([place]) [id number]/9381    Willow Ora, PTA, [facility] [address], [place], [place] [address] [phone] 05/08/19, 8:47 PM   Name: Edward [name] MRN: [id number] Date of Birth: [birth year]

## 2019-05-11 ENCOUNTER — Encounter: Payer: Self-pay | Admitting: Physical Therapy

## 2019-05-11 ENCOUNTER — Ambulatory Visit: Payer: Medicare Other | Admitting: Physical Therapy

## 2019-05-11 ENCOUNTER — Other Ambulatory Visit: Payer: Self-pay

## 2019-05-11 VITALS — BP 154/98

## 2019-05-11 DIAGNOSIS — M6281 Muscle weakness (generalized): Secondary | ICD-10-CM

## 2019-05-11 DIAGNOSIS — R293 Abnormal posture: Secondary | ICD-10-CM

## 2019-05-11 DIAGNOSIS — R2689 Other abnormalities of gait and mobility: Secondary | ICD-10-CM | POA: Diagnosis not present

## 2019-05-11 DIAGNOSIS — R2681 Unsteadiness on feet: Secondary | ICD-10-CM

## 2019-05-11 NOTE — Therapy (Signed)
[facility] [address] [place], [place], [address] Phone: [phone]   Fax:  [phone]  Physical Therapy Treatment  Patient Details  Name: Edward [name] MRN: [id number] Date of Birth: 22-May-1971 Referring Provider (PT): [name], [place]   Encounter Date: 05/11/2019  PT End of Session - 05/11/19 6962    Visit Number  15   visit correction from last visit.   Number of Visits  23    Date for PT Re-Evaluation  05/23/19    Authorization Type  Medicaid    Authorization Time Period  3 visits 01/21/2019 - 02/10/2019; 12 visits from 02/15/19 to 03/28/2019; 12 more visits between 04/12/2019 - 05/23/2019    Authorization - Visit Number  4    Authorization - Number of Visits  12    PT Start Time  0728   pt running late for appt   PT Stop Time  0800    PT Time Calculation (min)  32 min    Equipment Utilized During Treatment  Gait belt    Activity Tolerance  Patient tolerated treatment well;No increased pain;Patient limited by fatigue   elevated BP   Behavior During Therapy  [facility] for tasks assessed/performed       Past Medical History:  Diagnosis Date  . Anxiety    self reported  . Arthritis   . CHF (congestive heart failure) ([place])   . Coronary artery disease   . Depression    self reported  . Diabetes mellitus   . Hypertension     Past Surgical History:  Procedure Laterality Date  . AMPUTATION Left 05/10/2016   Procedure: Left Transmetatarsal Amputation;  Surgeon: [name], MD;  Location: [facility];  Service: Orthopedics;  Laterality: Left;  . AMPUTATION Right 05/16/2018   Procedure: PARTIAL RAY AMPUTATION FIFTH TOE RIGHT FOOT;  Surgeon: [name], DPM;  Location: [facility];  Service: Podiatry;  Laterality: Right;  . AMPUTATION Right 06/17/2018   Procedure: AMPUTATION 4th RAY Right Foot;  Surgeon: Trula Slade, DPM;  Location: [facility];  Service: Podiatry;  Laterality: Right;  . AMPUTATION Right 06/19/2018    Procedure: RIGHT BELOW KNEE AMPUTATION;  Surgeon: [name], MD;  Location: [facility];  Service: Orthopedics;  Laterality: Right;  . APPLICATION OF WOUND VAC Right 06/19/2018   Procedure: APPLICATION OF WOUND VAC;  Surgeon: [name], MD;  Location: [facility];  Service: Orthopedics;  Laterality: Right;  . CARDIAC CATHETERIZATION  07/29/2018  . CIRCUMCISION  09/02/2011   Procedure: CIRCUMCISION ADULT;  Surgeon: Molli Hazard, MD;  Location: [facility];  Service: Urology;  Laterality: N/A;  . CORONARY STENT INTERVENTION N/A 07/29/2018   Procedure: CORONARY STENT INTERVENTION;  Surgeon: Leonie Man, MD;  Location: [facility];  Service: Cardiovascular;  Laterality: N/A;  . I&D EXTREMITY  09/02/2011   Procedure: IRRIGATION AND DEBRIDEMENT EXTREMITY;  Surgeon: Theodoro Kos, DO;  Location: [facility];  Service: Plastics;  Laterality: N/A;  . INCISION AND DRAINAGE OF WOUND  08/12/2011   Procedure: IRRIGATION AND DEBRIDEMENT WOUND;  Surgeon: Zenovia Jarred, MD;  Location: [facility];  Service: General;;  perineum  . IRRIGATION AND DEBRIDEMENT ABSCESS  08/12/2011   Procedure: IRRIGATION AND DEBRIDEMENT ABSCESS;  Surgeon: Molli Hazard, MD;  Location: [facility];  Service: Urology;  Laterality: N/A;  irrigation and debridment perineum    . IRRIGATION AND DEBRIDEMENT ABSCESS  08/14/2011   Procedure: MINOR INCISION AND  DRAINAGE OF ABSCESS;  Surgeon: Molli Hazard, MD;  Location: [facility];  Service: Urology;  Laterality: N/A;  Perineal Wound Debridement;Placement Bilateral Testicular Thigh Pouches  . LEFT HEART CATH AND CORONARY ANGIOGRAPHY N/A 07/29/2018   Procedure: LEFT HEART CATH AND CORONARY ANGIOGRAPHY;  Surgeon: Leonie Man, MD;  Location: [facility];  Service: Cardiovascular;  Laterality: N/A;  . TOTAL HIP ARTHROPLASTY Left 10/11/2016   Procedure: LEFT TOTAL HIP ARTHROPLASTY ANTERIOR APPROACH;  Surgeon: Mcarthur Rossetti, MD;   Location: [facility];  Service: Orthopedics;  Laterality: Left;    Vitals:   05/11/19 0733 05/11/19 0738  BP: (!) 158/102 automated machine (!) 154/98 with manual recheck    Subjective Assessment - 05/11/19 0727    Subjective  No new complaints. No falls or pain to report. Reports LE swelling continues to get better. Did not take his meds this morning as he has not ate anything.    Pertinent History  R TTA, L THA 10/11/2016, L transmetarsal amp 2017, CAD, CHF, MI, arthritis, DM, HTN, CKD stage III    Limitations  Lifting;Walking;Standing;House hold activities    Patient Stated Goals  to use prosthesis for balance & walking.     Currently in Pain?  No/denies          [facility] Adult PT Treatment/Exercise - 05/11/19 0741      Transfers   Transfers  Sit to Stand;Stand to Sit    Sit to Stand  6: Modified independent (Device/Increase time);With upper extremity assist;From bed;From chair/3-in-1    Stand to Sit  6: Modified independent (Device/Increase time);With upper extremity assist;To bed;To chair/3-in-1      Ambulation/Gait   Ambulation/Gait  Yes    Ambulation/Gait Assistance  5: Supervision    Ambulation Distance (Feet)  150 Feet   x1 rep, plus into/out of gym   Assistive device  Straight cane;Prosthesis    Gait Pattern  Step-through pattern;Decreased step length - left;Decreased stance time - right;Decreased stride length;Decreased hip/knee flexion - right;Decreased weight shift to right;Right hip hike;Antalgic;Lateral hip instability;Trunk flexed;Abducted- right    Ambulation Surface  Level;Indoor      Self-Care   Self-Care  Other Self-Care Comments    Other Self-Care Comments   long discussion on importance of taking meds in morning/as scheduled. Discussed having access to quick healthy snacks to allow him to eat in morning when rushed so he can continue to take meds on time. Also discussued used of pill boxes to make managing meds easier. Pt verbalized understanding.       Knee/Hip  Exercises: Aerobic   Other Aerobic  Scifit UE/LE level 2.0 with goal >/= 60 rpm for stengthening/activity tolerance with pt rotation work for 1 mintue, rest for 1 minute for 4 total minutes of work.       Prosthetics   Current prosthetic wear tolerance (days/week)   daily    Current prosthetic wear tolerance (#hours/day)   most awake hours, drying as needed    Residual limb condition   intact with no issues per pt report    Donning Prosthesis  Modified independent (device/increased time)    Doffing Prosthesis  Modified independent (device/increased time)           PT Short Term Goals - 04/16/19 2116      PT SHORT TERM GOAL #1   Title  = LTG        PT Long Term Goals - 04/16/19 2116      PT LONG TERM GOAL #  1   Title  Patient is independent with all aspects of prosthetic care to enable safe utilization of the prosthesis    Baseline  03/26/19: pt continues to need min cues/guidance on new aspects related to prosthetic care    Time  12    Period  --   visits   Status  On-going    Target Date  05/23/19      PT LONG TERM GOAL #2   Title  patient tolerates wear prosthesis daily for greater than 90% of awake hours without skin integrity problems or limb discomfort to enable function throughout his day     Baseline  03/26/19: pt currently wearing up to 10-12 hours a day with pain with prolonged wear    Time  12    Period  --   visits   Status  On-going    Target Date  05/23/19      PT LONG TERM GOAL #3   Title  Berg balance greater than 45 /56 to indicate lower fall risk and less dependency in standing ADLs    Baseline  03/26/19: scored 35/56. revised LTG due to improvement from  14/56 to 35/56 (original goal 36/56) per PT instruction.    Time  12    Period  --   visits   Status  Revised    Target Date  05/23/19      PT LONG TERM GOAL #4   Title  patient ambulates 500 feet with LRAD and prosthesis modified independent to enable community access     Baseline  03/26/19: pt able to  ambulate 130 feet with straight cane with min guard assist.    Time  12    Period  --   visits   Status  On-going    Target Date  05/23/19      PT LONG TERM GOAL #5   Title  patient negotiates ramps & curbs with LRAD and stairs with one rail and the prosthesis modified independent to enable community access     Baseline  03/26/19: min assist with cane on ramps/curbs with prosthesis.    Time  12    Period  --   visits   Status  On-going    Target Date  05/23/19            Plan - 05/11/19 0729    Clinical Impression Statement  Today's skilled session was limited due to elevated BP due to not taking his medication this morning. Continued to focus on activity tolerance and strengthening with mild increase in BP, 154/102, afterwards. Pt is to go home to eat and take medication.    Personal Factors and Comorbidities  Comorbidity 3+;Fitness;Time since onset of injury/illness/exacerbation    Comorbidities  R TTA, L THA 10/11/2016, L transmetarsal amp 2017, CAD, CHF, MI, arthritis, DM2, HTN, CKD stage III    Examination-Activity Limitations  Caring for Others;Carry;Locomotion Level;Reach Overhead;Stairs;Stand;Transfers    Examination-Participation Restrictions  Community Activity;Driving;Personal Finances    Stability/Clinical Decision Making  Evolving/Moderate complexity    Rehab Potential  Good    PT Frequency  2x / week   1x/wk for 3 weeks, then 2x/wk for 6 weeks   PT Duration  6 weeks   9 weeks   PT Treatment/Interventions  ADLs/Self Care Home Management;DME Instruction;Gait training;Stair training;Functional mobility training;Canalith Repostioning;Therapeutic activities;Therapeutic exercise;Balance training;Neuromuscular re-education;Patient/family education;Prosthetic Training;Manual techniques;Vestibular    PT Next Visit Plan  Monitor BP.  Continue to work on balance, endurance for gait > 500'  with cane, obstacles/ramp/curb with cane    PT Home Exercise Plan  Access Code: [email or web address] &  Amputee sink HEP    Consulted and Agree with Plan of Care  Patient       Patient will benefit from skilled therapeutic intervention in order to improve the following deficits and impairments:  Abnormal gait, Decreased activity tolerance, Decreased balance, Decreased endurance, Decreased knowledge of use of DME, Decreased mobility, Decreased skin integrity, Decreased strength, Prosthetic Dependency, Postural dysfunction, Pain  Visit Diagnosis: Other abnormalities of gait and mobility  Unsteadiness on feet  Muscle weakness (generalized)  Abnormal posture     Problem List Patient Active Problem List   Diagnosis Date Noted  . Elevated troponin 12/22/2018  . Acute exacerbation of CHF (congestive heart failure) ([place]) 12/21/2018  . Gait abnormality 09/11/2018  . CAD S/P percutaneous coronary angioplasty 08/06/2018  . Cardiomyopathy ([place]) 08/06/2018  . PVD (peripheral vascular disease) ([place]) 08/06/2018  . Shortness of breath   . Acute on chronic combined systolic and diastolic CHF, NYHA class 2 ([place])   . History of NSTEMI 07/28/2018  . Respiratory failure with hypoxia ([place]) 07/28/2018  . Acute heart failure ([place]) 07/28/2018  . Hypertensive emergency 07/28/2018  . S/P BKA (below knee amputation) unilateral, right ([place]) 07/15/2018  . Hypoglycemia   . Anxiety about health   . Labile blood glucose   . Essential hypertension   . History of transmetatarsal amputation of left foot ([place])   . Hypoalbuminemia due to protein-calorie malnutrition ([place])   . Stage 3 chronic kidney disease ([place])   . Poorly controlled type 2 diabetes mellitus with peripheral neuropathy ([place])   . Acute blood loss anemia   . Anemia of chronic disease   . Leukocytosis   . Tobacco abuse   . Hyperkalemia 06/20/2018  . Diabetic polyneuropathy associated with type 2 diabetes mellitus ([place])   . Severe protein-calorie malnutrition ([place])   . AKI (acute kidney injury) ([place]) 06/17/2018  . Normocytic anemia 06/17/2018   . DM2 (diabetes mellitus, type 2) ([place]) 05/14/2018  . Viral pharyngitis 01/14/2017  . Cigarette smoker 01/14/2017  . Avascular necrosis of hip, left ([place]) 10/11/2016  . Status post left hip replacement 10/11/2016  . S/P transmetatarsal amputation of foot, left ([place]) 07/25/2016  . Balance problem 07/25/2016  . Fournier's gangrene 08/16/2011  . Acute respiratory failure ([place]) 08/16/2011  . Septic shock(785.52) 08/16/2011  . Insulin dependent diabetes mellitus with complications ([place]) 09/38/1829    Willow Ora, PTA, [facility] [address] [place], [place] [address] [phone] 05/11/19, 9:24 AM   Name: Edward [name] MRN: [id number] Date of Birth: 28-Aug-1970

## 2019-05-18 ENCOUNTER — Ambulatory Visit: Payer: Medicare Other | Admitting: Physical Therapy

## 2019-05-19 NOTE — Research (Signed)
V6 Visit.  Patient seen with Ms. Halley.  Patient sp PCI of CFX for ACS, with severe residual disease of the LAD, CKD, and diastolic and systolic heart failure with history of volume overload at times.  SP BKA.  No chest pain as noted.   VSS as noted.  Lungs Clear.    FU with Dr. Kirk Ruths.  [place] Class II which is stable.    V6 completed

## 2019-05-20 ENCOUNTER — Encounter: Payer: Self-pay | Admitting: Physical Therapy

## 2019-05-20 ENCOUNTER — Encounter: Payer: Self-pay | Admitting: *Deleted

## 2019-05-20 ENCOUNTER — Ambulatory Visit: Payer: Medicare Other | Attending: Physician Assistant | Admitting: Physical Therapy

## 2019-05-20 ENCOUNTER — Other Ambulatory Visit: Payer: Self-pay

## 2019-05-20 VITALS — BP 144/89 | HR 82

## 2019-05-20 DIAGNOSIS — Z9181 History of falling: Secondary | ICD-10-CM | POA: Diagnosis present

## 2019-05-20 DIAGNOSIS — M79662 Pain in left lower leg: Secondary | ICD-10-CM | POA: Insufficient documentation

## 2019-05-20 DIAGNOSIS — R293 Abnormal posture: Secondary | ICD-10-CM | POA: Diagnosis present

## 2019-05-20 DIAGNOSIS — R2681 Unsteadiness on feet: Secondary | ICD-10-CM | POA: Insufficient documentation

## 2019-05-20 DIAGNOSIS — R2689 Other abnormalities of gait and mobility: Secondary | ICD-10-CM | POA: Diagnosis present

## 2019-05-20 DIAGNOSIS — M6281 Muscle weakness (generalized): Secondary | ICD-10-CM | POA: Insufficient documentation

## 2019-05-20 DIAGNOSIS — G546 Phantom limb syndrome with pain: Secondary | ICD-10-CM | POA: Insufficient documentation

## 2019-05-20 NOTE — Therapy (Signed)
[facility] [address] [place], [place], [address] Phone: [phone]   Fax:  [phone]  Physical Therapy Treatment & Reassessment  Patient Details  Name: Edward [name] MRN: [id number] Date of Birth: [birth year] Referring Provider (PT): [name], [place]   Encounter Date: 05/20/2019   CLINIC OPERATION CHANGES: [facility] is open at lower capacity following universal masking, social distancing, and patient screening.  The patient's COVID risk of complications score is 6.   PT End of Session - 05/20/19 1039    Visit Number  16    Number of Visits  40    Date for PT Re-Evaluation  08/18/19    Authorization Type  Medicaid,  As of Oct 1, he has both Medicare & Medicaid so VL is medical necessity not Medicaid only limitations.    PT Start Time  0845    PT Stop Time  0930    PT Time Calculation (min)  45 min    Equipment Utilized During Treatment  Gait belt    Activity Tolerance  Patient tolerated treatment well;No increased pain;Patient limited by fatigue   elevated BP   Behavior During Therapy  [facility] for tasks assessed/performed       Past Medical History:  Diagnosis Date  . Anxiety    self reported  . Arthritis   . CHF (congestive heart failure) ([place])   . Coronary artery disease   . Depression    self reported  . Diabetes mellitus   . Hypertension     Past Surgical History:  Procedure Laterality Date  . AMPUTATION Left 05/10/2016   Procedure: Left Transmetatarsal Amputation;  Surgeon: [name], MD;  Location: [facility];  Service: Orthopedics;  Laterality: Left;  . AMPUTATION Right 05/16/2018   Procedure: PARTIAL RAY AMPUTATION FIFTH TOE RIGHT FOOT;  Surgeon: [name], DPM;  Location: [facility];  Service: Podiatry;  Laterality: Right;  . AMPUTATION Right 06/17/2018   Procedure: AMPUTATION 4th RAY Right Foot;  Surgeon: Trula Slade, DPM;  Location: [facility];  Service: Podiatry;  Laterality:  Right;  . AMPUTATION Right 06/19/2018   Procedure: RIGHT BELOW KNEE AMPUTATION;  Surgeon: [name], MD;  Location: [facility];  Service: Orthopedics;  Laterality: Right;  . APPLICATION OF WOUND VAC Right 06/19/2018   Procedure: APPLICATION OF WOUND VAC;  Surgeon: [name], MD;  Location: [facility];  Service: Orthopedics;  Laterality: Right;  . CARDIAC CATHETERIZATION  07/29/2018  . CIRCUMCISION  09/02/2011   Procedure: CIRCUMCISION ADULT;  Surgeon: Molli Hazard, MD;  Location: [facility];  Service: Urology;  Laterality: N/A;  . CORONARY STENT INTERVENTION N/A 07/29/2018   Procedure: CORONARY STENT INTERVENTION;  Surgeon: Leonie Man, MD;  Location: [facility];  Service: Cardiovascular;  Laterality: N/A;  . I&D EXTREMITY  09/02/2011   Procedure: IRRIGATION AND DEBRIDEMENT EXTREMITY;  Surgeon: Theodoro Kos, DO;  Location: [facility];  Service: Plastics;  Laterality: N/A;  . INCISION AND DRAINAGE OF WOUND  08/12/2011   Procedure: IRRIGATION AND DEBRIDEMENT WOUND;  Surgeon: Zenovia Jarred, MD;  Location: [facility];  Service: General;;  perineum  . IRRIGATION AND DEBRIDEMENT ABSCESS  08/12/2011   Procedure: IRRIGATION AND DEBRIDEMENT ABSCESS;  Surgeon: Molli Hazard, MD;  Location: [facility];  Service: Urology;  Laterality: N/A;  irrigation and debridment perineum    . IRRIGATION AND DEBRIDEMENT ABSCESS  08/14/2011   Procedure: MINOR INCISION AND DRAINAGE OF  ABSCESS;  Surgeon: Molli Hazard, MD;  Location: [facility];  Service: Urology;  Laterality: N/A;  Perineal Wound Debridement;Placement Bilateral Testicular Thigh Pouches  . LEFT HEART CATH AND CORONARY ANGIOGRAPHY N/A 07/29/2018   Procedure: LEFT HEART CATH AND CORONARY ANGIOGRAPHY;  Surgeon: Leonie Man, MD;  Location: [facility];  Service: Cardiovascular;  Laterality: N/A;  . TOTAL HIP ARTHROPLASTY Left 10/11/2016   Procedure: LEFT TOTAL HIP ARTHROPLASTY ANTERIOR APPROACH;   Surgeon: Mcarthur Rossetti, MD;  Location: [facility];  Service: Orthopedics;  Laterality: Left;    Vitals:   05/20/19 0900  BP: (!) 144/89  Pulse: 82    Subjective Assessment - 05/20/19 0845    Subjective  He did not take BP med again due to not eating. PT educated on habits to enable to take meds as prescribed. Patient is wearing prosthesis most of awake hours from arising, removes to shower, drys as needed, removes within hour of bedtime.    Pertinent History  R TTA, L THA 10/11/2016, L transmetarsal amp 2017, CAD, CHF, MI, arthritis, DM, HTN, CKD stage III    Limitations  Lifting;Walking;Standing;House hold activities    Patient Stated Goals  to use prosthesis for balance & walking.     Currently in Pain?  No/denies         [facility] PT Assessment - 05/20/19 0845      Assessment   Medical Diagnosis  Right Transtibial Amputation    Referring Provider (PT)  [name], PA    Onset Date/Surgical Date  12/24/18   DC with recent hospitalization     Berg Balance Test   Sit to Stand  Able to stand  independently using hands    Standing Unsupported  Able to stand 2 minutes with supervision    Sitting with Back Unsupported but Feet Supported on Floor or Stool  Able to sit safely and securely 2 minutes    Stand to Sit  Controls descent by using hands    Transfers  Able to transfer safely, minor use of hands    Standing Unsupported with Eyes Closed  Able to stand 10 seconds with supervision    Standing Unsupported with Feet Together  Able to place feet together independently and stand for 1 minute with supervision    From Standing, Reach Forward with Outstretched Arm  Reaches forward but needs supervision    From Standing Position, Pick up Object from [place] to pick up shoe, needs supervision    From Standing Position, Turn to Look Behind Over each Shoulder  Needs supervision when turning    Turn 360 Degrees  Needs assistance while turning    Standing Unsupported, Alternately  Place Feet on Step/Stool  Able to complete >2 steps/needs minimal assist    Standing Unsupported, One Foot in Front  Needs help to step but can hold 15 seconds    Standing on One Leg  Unable to try or needs assist to prevent fall    Total Score  30                   [facility] Adult PT Treatment/Exercise - 05/20/19 0845      Transfers   Transfers  Sit to Stand;Stand to Sit    Sit to Stand  5: Supervision;With upper extremity assist;With armrests;From chair/3-in-1    Stand to Sit  5: Supervision;With upper extremity assist;With armrests;To chair/3-in-1      Ambulation/Gait   Ambulation/Gait  Yes  Ambulation/Gait Assistance  5: Supervision;4: Min assist    Ambulation/Gait Assistance Details  tactile & verbal cues on posture, step width, step length & wt shift.     Ambulation Distance (Feet)  250 Feet    Assistive device  Straight cane;Prosthesis    Gait Pattern  Step-through pattern;Decreased step length - left;Decreased stance time - right;Decreased stride length;Decreased hip/knee flexion - right;Decreased weight shift to right;Right hip hike;Antalgic;Lateral hip instability;Trunk flexed;Abducted- right;Decreased arm swing - right;Right flexed knee in stance    Ambulation Surface  Level;Indoor    Gait velocity  1.75 ft/sec    Ramp  4: Min assist   cane & prosthesis   Ramp Details (indicate cue type and reason)  tactile & verbal cues on technique with TTA prosthesis & cane, manual cues for balance    Curb  4: Min assist   cane & prosthesis   Curb Details (indicate cue type and reason)  tactile & verbal cues on technique with TTA prosthesis & cane, manual cues for balance      Self-Care   Self-Care  Lifting    Lifting  PT demo & verbal cues on technique with TTA prosthesis.  PT return demo understanding with minA / tactile cues      Prosthetics   Prosthetic Care Comments   verbal cues on sweat signs & management,     Current prosthetic wear tolerance (days/week)   daily     Current prosthetic wear tolerance (#hours/day)   most awake hours, drying as needed    Residual limb condition   intact with no issues    Education Provided  Residual limb care;Skin check;Prosthetic cleaning;Correct ply sock adjustment;Proper Donning;Proper wear schedule/adjustment    Person(s) Educated  Patient    Education Method  Explanation;Verbal cues;Demonstration;Tactile cues    Education Method  Verbalized understanding;Needs further instruction    Donning Prosthesis  Modified independent (device/increased time)    Doffing Prosthesis  Modified independent (device/increased time)             PT Education - 05/20/19 0925    Education Details  Medicare & Medicaid coverage of PT.  Need to manage BP, plan ahead for breakfast if needs to leave house in morning so he can take his medications.  Smoking cessation    Person(s) Educated  Patient    Methods  Explanation;Verbal cues    Comprehension  Verbalized understanding;Need further instruction       PT Short Term Goals - 05/20/19 2204      PT SHORT TERM GOAL #1   Title  Patient verbalizes proper sweat  management of residual limb. (All updated STGs Target Date: 06/18/2019)    Time  4    Period  Weeks    Status  Revised    Target Date  06/18/19      PT SHORT TERM GOAL #2   Title  Berg Balance >36/56    Time  4    Period  Weeks    Status  New    Target Date  06/18/19      PT SHORT TERM GOAL #3   Title  Patient ambulates 300' outdoors on paved surfaces with prosthesis & cane with supervision.    Time  4    Period  Weeks    Status  Revised    Target Date  06/18/19      PT SHORT TERM GOAL #4   Title  Patient negotiates ramps & curbs with cane & prosthesis with supervision.  Time  4    Period  Weeks    Status  Revised    Target Date  06/18/19      PT SHORT TERM GOAL #5   Title  Patient verbalizes fall prevention strategies.    Time  4    Period  Weeks    Status  New    Target Date  06/18/19        PT Long  Term Goals - 05/20/19 2210      PT LONG TERM GOAL #1   Title  Patient is independent with all aspects of prosthetic care &  tolerates wear prosthesis daily for greater than 90% of awake hours without skin integrity problems or limb discomfort to enable function throughout his day to enable safe utilization of the prosthesis  (All LTGs Target Date:  08/18/2019)    Time  12    Period  Weeks   visits   Status  On-going    Target Date  08/18/19      PT LONG TERM GOAL #2   Title  Dynamic Gait Index with prosthesis & cane >16/24    Time  12    Period  Weeks   visits   Status  New    Target Date  08/18/19      PT LONG TERM GOAL #3   Title  Berg balance > 45 /56 to indicate lower fall risk and less dependency in standing ADLs    Time  12    Period  Weeks   visits   Status  On-going    Target Date  08/18/19      PT LONG TERM GOAL #4   Title  patient ambulates 500 feet with LRAD and prosthesis modified independent to enable community access     Time  12    Period  Weeks   visits   Status  On-going    Target Date  08/18/19      PT LONG TERM GOAL #5   Title  patient negotiates ramps & curbs with LRAD and stairs with one rail and the prosthesis modified independent to enable community access     Time  12    Period  Weeks   visits   Status  On-going    Target Date  08/18/19            Plan - 05/20/19 2214    Clinical Impression Statement  Patient has declined in balance & gait with cane probably due to excerbation of his CHF recently.  Patient now has both Medicare & Medicaid so he is not limited in visits just medical necissity. He has medical necessity to continue physical therapy to improve function & decrease fall risk.    Personal Factors and Comorbidities  Comorbidity 3+;Fitness;Time since onset of injury/illness/exacerbation    Comorbidities  R TTA, L THA 10/11/2016, L transmetarsal amp 2017, CAD, CHF, MI, arthritis, DM2, HTN, CKD stage III    Examination-Activity  Limitations  Caring for Others;Carry;Locomotion Level;Reach Overhead;Stairs;Stand;Transfers    Examination-Participation Restrictions  Community Activity;Driving;Personal Finances    Stability/Clinical Decision Making  Evolving/Moderate complexity    Rehab Potential  Good    PT Frequency  2x / week   1x/wk for 3 weeks, then 2x/wk for 6 weeks   PT Duration  6 weeks   9 weeks   PT Treatment/Interventions  ADLs/Self Care Home Management;DME Instruction;Gait training;Stair training;Functional mobility training;Canalith Repostioning;Therapeutic activities;Therapeutic exercise;Balance training;Neuromuscular re-education;Patient/family education;Prosthetic Training;Manual techniques;Vestibular    PT Next  Visit Plan  Monitor BP.  Work towards updated STGs.  Continue to work on balance, endurance for gait > 500' with cane, obstacles/ramp/curb with cane    PT Home Exercise Plan  Access Code: [email or web address] & Amputee sink HEP    Consulted and Agree with Plan of Care  Patient       Patient will benefit from skilled therapeutic intervention in order to improve the following deficits and impairments:  Abnormal gait, Decreased activity tolerance, Decreased balance, Decreased endurance, Decreased knowledge of use of DME, Decreased mobility, Decreased skin integrity, Decreased strength, Prosthetic Dependency, Postural dysfunction, Pain  Visit Diagnosis: Other abnormalities of gait and mobility  Unsteadiness on feet  Muscle weakness (generalized)  Abnormal posture  Pain in left lower leg  History of falling  Phantom limb syndrome with pain [facility])     Problem List Patient Active Problem List   Diagnosis Date Noted  . Elevated troponin 12/22/2018  . Acute exacerbation of CHF (congestive heart failure) ([place]) 12/21/2018  . Gait abnormality 09/11/2018  . CAD S/P percutaneous coronary angioplasty 08/06/2018  . Cardiomyopathy ([place]) 08/06/2018  . PVD (peripheral vascular disease) ([place]) 08/06/2018  .  Shortness of breath   . Acute on chronic combined systolic and diastolic CHF, NYHA class 2 ([place])   . History of NSTEMI 07/28/2018  . Respiratory failure with hypoxia ([place]) 07/28/2018  . Acute heart failure ([place]) 07/28/2018  . Hypertensive emergency 07/28/2018  . S/P BKA (below knee amputation) unilateral, right ([place]) 07/15/2018  . Hypoglycemia   . Anxiety about health   . Labile blood glucose   . Essential hypertension   . History of transmetatarsal amputation of left foot ([place])   . Hypoalbuminemia due to protein-calorie malnutrition ([place])   . Stage 3 chronic kidney disease   . Poorly controlled type 2 diabetes mellitus with peripheral neuropathy ([place])   . Acute blood loss anemia   . Anemia of chronic disease   . Leukocytosis   . Tobacco abuse   . Hyperkalemia 06/20/2018  . Diabetic polyneuropathy associated with type 2 diabetes mellitus ([place])   . Severe protein-calorie malnutrition ([place])   . AKI (acute kidney injury) ([place]) 06/17/2018  . Normocytic anemia 06/17/2018  . DM2 (diabetes mellitus, type 2) ([place]) 05/14/2018  . Viral pharyngitis 01/14/2017  . Cigarette smoker 01/14/2017  . Avascular necrosis of hip, left ([place]) 10/11/2016  . Status post left hip replacement 10/11/2016  . S/P transmetatarsal amputation of foot, left ([place]) 07/25/2016  . Balance problem 07/25/2016  . Fournier's gangrene 08/16/2011  . Acute respiratory failure ([place]) 08/16/2011  . Septic shock(785.52) 08/16/2011  . Insulin dependent diabetes mellitus with complications ([place]) 17/79/3903    Khalfani Weideman PT, DPT 05/20/2019, 10:18 PM  [facility] [address] [place], [place], [address] Phone: [phone]   Fax:  [phone]  Name: FELICIANO WYNTER MRN: [id number] Date of Birth: 1970-08-31

## 2019-05-26 ENCOUNTER — Ambulatory Visit: Payer: Medicare Other | Admitting: Physical Therapy

## 2019-05-28 ENCOUNTER — Encounter: Payer: Self-pay | Admitting: Physical Therapy

## 2019-05-28 ENCOUNTER — Ambulatory Visit: Payer: Medicare Other | Admitting: Physical Therapy

## 2019-05-28 ENCOUNTER — Other Ambulatory Visit: Payer: Self-pay

## 2019-05-28 DIAGNOSIS — R293 Abnormal posture: Secondary | ICD-10-CM

## 2019-05-28 DIAGNOSIS — R2681 Unsteadiness on feet: Secondary | ICD-10-CM

## 2019-05-28 DIAGNOSIS — M6281 Muscle weakness (generalized): Secondary | ICD-10-CM

## 2019-05-28 DIAGNOSIS — R2689 Other abnormalities of gait and mobility: Secondary | ICD-10-CM

## 2019-05-31 MED FILL — SSD 1% CREAM: 1 | 30 days supply | Qty: 50 | Fill #0

## 2019-05-31 NOTE — Therapy (Signed)
[facility] [address] [place], [place], [address] Phone: [phone]   Fax:  [phone]  Physical Therapy Treatment  Patient Details  Name: Edward [name] MRN: [id number] Date of Birth: 1971/05/03 Referring Provider (PT): [name], [place]   Encounter Date: 05/28/2019     05/28/19 3500  PT Visits / Re-Eval  Visit Number 17 (progress note due visit 39)  Number of Visits 40  Date for PT Re-Evaluation 08/18/19  Authorization  Authorization Type medicare/Medicaid. 10th visit progress note.  PT Time Calculation  PT Start Time 0932  PT Stop Time 1014  PT Time Calculation (min) 42 min  PT - End of Session  Equipment Utilized During Treatment Gait belt  Activity Tolerance Patient tolerated treatment well;No increased pain;Patient limited by fatigue (elevated BP)  Behavior During Therapy [facility] for tasks assessed/performed    Past Medical History:  Diagnosis Date  . Anxiety    self reported  . Arthritis   . CHF (congestive heart failure) ([place])   . Coronary artery disease   . Depression    self reported  . Diabetes mellitus   . Hypertension     Past Surgical History:  Procedure Laterality Date  . AMPUTATION Left 05/10/2016   Procedure: Left Transmetatarsal Amputation;  Surgeon: [name], MD;  Location: [facility];  Service: Orthopedics;  Laterality: Left;  . AMPUTATION Right 05/16/2018   Procedure: PARTIAL RAY AMPUTATION FIFTH TOE RIGHT FOOT;  Surgeon: [name], DPM;  Location: [facility];  Service: Podiatry;  Laterality: Right;  . AMPUTATION Right 06/17/2018   Procedure: AMPUTATION 4th RAY Right Foot;  Surgeon: Trula Slade, DPM;  Location: [facility];  Service: Podiatry;  Laterality: Right;  . AMPUTATION Right 06/19/2018   Procedure: RIGHT BELOW KNEE AMPUTATION;  Surgeon: [name], MD;  Location: [facility];  Service: Orthopedics;  Laterality: Right;  . APPLICATION OF WOUND VAC Right 06/19/2018   Procedure:  APPLICATION OF WOUND VAC;  Surgeon: [name], MD;  Location: [facility];  Service: Orthopedics;  Laterality: Right;  . CARDIAC CATHETERIZATION  07/29/2018  . CIRCUMCISION  09/02/2011   Procedure: CIRCUMCISION ADULT;  Surgeon: Molli Hazard, MD;  Location: [facility];  Service: Urology;  Laterality: N/A;  . CORONARY STENT INTERVENTION N/A 07/29/2018   Procedure: CORONARY STENT INTERVENTION;  Surgeon: Leonie Man, MD;  Location: [facility];  Service: Cardiovascular;  Laterality: N/A;  . I&D EXTREMITY  09/02/2011   Procedure: IRRIGATION AND DEBRIDEMENT EXTREMITY;  Surgeon: Theodoro Kos, DO;  Location: [facility];  Service: Plastics;  Laterality: N/A;  . INCISION AND DRAINAGE OF WOUND  08/12/2011   Procedure: IRRIGATION AND DEBRIDEMENT WOUND;  Surgeon: Zenovia Jarred, MD;  Location: [facility];  Service: General;;  perineum  . IRRIGATION AND DEBRIDEMENT ABSCESS  08/12/2011   Procedure: IRRIGATION AND DEBRIDEMENT ABSCESS;  Surgeon: Molli Hazard, MD;  Location: [facility];  Service: Urology;  Laterality: N/A;  irrigation and debridment perineum    . IRRIGATION AND DEBRIDEMENT ABSCESS  08/14/2011   Procedure: MINOR INCISION AND DRAINAGE OF ABSCESS;  Surgeon: Molli Hazard, MD;  Location: [facility];  Service: Urology;  Laterality: N/A;  Perineal Wound Debridement;Placement Bilateral Testicular Thigh Pouches  . LEFT HEART CATH AND CORONARY ANGIOGRAPHY N/A 07/29/2018   Procedure: LEFT HEART CATH AND CORONARY ANGIOGRAPHY;  Surgeon: Leonie Man, MD;  Location: [facility];  Service: Cardiovascular;  Laterality: N/A;  .  TOTAL HIP ARTHROPLASTY Left 10/11/2016   Procedure: LEFT TOTAL HIP ARTHROPLASTY ANTERIOR APPROACH;  Surgeon: Mcarthur Rossetti, MD;  Location: [facility];  Service: Orthopedics;  Laterality: Left;    There were no vitals filed for this visit.     05/28/19 0935  Symptoms/Limitations  Subjective Reports the water blisters  are opening on his left LE. Breathing is still hard after exerting himself- such as with walking, etc.  Pertinent History R TTA, L THA 10/11/2016, L transmetarsal amp 2017, CAD, CHF, MI, arthritis, DM, HTN, CKD stage III  Limitations Lifting;Walking;Standing;House hold activities  Patient Stated Goals to use prosthesis for balance & walking.   Pain Assessment  Currently in Pain? No/denies  Pain Score 0      05/28/19 0940  Transfers  Transfers Sit to Stand;Stand to Sit  Sit to Stand 5: Supervision;With upper extremity assist;With armrests;From chair/3-in-1  Stand to Sit 5: Supervision;With upper extremity assist;With armrests;To chair/3-in-1  Ambulation/Gait  Ambulation/Gait Yes  Ambulation/Gait Assistance 5: Supervision  Ambulation/Gait Assistance Details cues on posture, step length and cane placement with gait.   Ambulation Distance (Feet) 110 Feet (x2, around gym with activity. )  Assistive device Straight cane;Prosthesis  Gait Pattern Step-through pattern;Decreased step length - left;Decreased stance time - right;Decreased stride length;Decreased hip/knee flexion - right;Decreased weight shift to right;Right hip hike;Antalgic;Lateral hip instability;Trunk flexed;Abducted- right;Decreased arm swing - right;Right flexed knee in stance  Ambulation Surface Level;Indoor  Self-Care  Other Self-Care Comments  on shin of left LE pt with open blister measuring length 3.6 cm, width 3.0 cm. Covered with gauze due to closed blister right above it that do not want to pop/open with use of Tegaderm. Attempted to call [facility] x3 to get appt with Duda/his PA with no answer all attempts. Call placed to pt's primary MD office with appt set for Sat 05/28/09 at 10:15 to have wound checked out.   Knee/Hip Exercises: Aerobic  Other Aerobic Scifit UE/LE level 1.5 with goal >/= 40-50 rpm for 2 minute work, 1 minute rest, 1:45 work, 2 minute rest, 2 minute work, 2 minute rest for 2 sets of this cycle. SaO2  >/=96% with all work. Dyspnea 2-3/4 after each bout of work. HR stable in 80's.   Prosthetics  Current prosthetic wear tolerance (days/week)  daily  Current prosthetic wear tolerance (#hours/day)  most awake hours, drying as needed  Residual limb condition  intact with no issues  Education Provided Residual limb care;Care of non-amputated limb;Proper wear schedule/adjustment;Proper weight-bearing schedule/adjustment  Person(s) Educated Patient  Education Method Explanation;Demonstration;Verbal cues  Education Method Verbalized understanding;Returned demonstration;Verbal cues required;Needs further instruction  Donning Prosthesis 6  Doffing Prosthesis 6        PT Short Term Goals - 05/20/19 2204      PT SHORT TERM GOAL #1   Title  Patient verbalizes proper sweat  management of residual limb. (All updated STGs Target Date: 06/18/2019)    Time  4    Period  Weeks    Status  Revised    Target Date  06/18/19      PT SHORT TERM GOAL #2   Title  Berg Balance >36/56    Time  4    Period  Weeks    Status  New    Target Date  06/18/19      PT SHORT TERM GOAL #3   Title  Patient ambulates 300' outdoors on paved surfaces with prosthesis & cane with supervision.    Time  4  Period  Weeks    Status  Revised    Target Date  06/18/19      PT SHORT TERM GOAL #4   Title  Patient negotiates ramps & curbs with cane & prosthesis with supervision.    Time  4    Period  Weeks    Status  Revised    Target Date  06/18/19      PT SHORT TERM GOAL #5   Title  Patient verbalizes fall prevention strategies.    Time  4    Period  Weeks    Status  New    Target Date  06/18/19        PT Long Term Goals - 05/20/19 2210      PT LONG TERM GOAL #1   Title  Patient is independent with all aspects of prosthetic care &  tolerates wear prosthesis daily for greater than 90% of awake hours without skin integrity problems or limb discomfort to enable function throughout his day to enable safe  utilization of the prosthesis  (All LTGs Target Date:  08/18/2019)    Time  12    Period  Weeks   visits   Status  On-going    Target Date  08/18/19      PT LONG TERM GOAL #2   Title  Dynamic Gait Index with prosthesis & cane >16/24    Time  12    Period  Weeks   visits   Status  New    Target Date  08/18/19      PT LONG TERM GOAL #3   Title  Berg balance > 45 /56 to indicate lower fall risk and less dependency in standing ADLs    Time  12    Period  Weeks   visits   Status  On-going    Target Date  08/18/19      PT LONG TERM GOAL #4   Title  patient ambulates 500 feet with LRAD and prosthesis modified independent to enable community access     Time  12    Period  Weeks   visits   Status  On-going    Target Date  08/18/19      PT LONG TERM GOAL #5   Title  patient negotiates ramps & curbs with LRAD and stairs with one rail and the prosthesis modified independent to enable community access     Time  12    Period  Weeks   visits   Status  On-going    Target Date  08/18/19         05/28/19 4235  Plan  Clinical Impression Statement Pt arrived with reports of issues on left leg due to edema and fluid loss. Reports he has blisters that look like they are going to open, Upon pulling down pt's compression sock noted open wound on left LE/shin area with closed fluid filled blister right above it. Covered with gauze over open wound with paper tape (no tegaderm due to closed fluid blister approximity. Call placed to primary MD with appt set for next am. Remainder of session addressed activity tolerance and gait with cane/prosthesis. The pt should benefit from continued PT to progress toward unmet goals.  Personal Factors and Comorbidities Comorbidity 3+;Fitness;Time since onset of injury/illness/exacerbation  Comorbidities R TTA, L THA 10/11/2016, L transmetarsal amp 2017, CAD, CHF, MI, arthritis, DM2, HTN, CKD stage III  Examination-Activity Limitations Caring for  Others;Carry;Locomotion Level;Reach Overhead;Stairs;Stand;Transfers  Examination-Participation Restrictions Community Activity;Driving;Personal Finances  Pt will benefit from skilled therapeutic intervention in order to improve on the following deficits Abnormal gait;Decreased activity tolerance;Decreased balance;Decreased endurance;Decreased knowledge of use of DME;Decreased mobility;Decreased skin integrity;Decreased strength;Prosthetic Dependency;Postural dysfunction;Pain  Stability/Clinical Decision Making Evolving/Moderate complexity  Rehab Potential Good  PT Frequency 2x / week (1x/wk for 3 weeks, then 2x/wk for 6 weeks)  PT Duration 6 weeks (9 weeks)  PT Treatment/Interventions ADLs/Self Care Home Management;DME Instruction;Gait training;Stair training;Functional mobility training;Canalith Repostioning;Therapeutic activities;Therapeutic exercise;Balance training;Neuromuscular re-education;Patient/family education;Prosthetic Training;Manual techniques;Vestibular  PT Next Visit Plan Monitor BP. Check wound on left LE/what did MD say?  Work towards updated STGs.  Continue to work on balance, endurance for gait > 500' with cane, obstacles/ramp/curb with cane  PT Home Exercise Plan Access Code: [email or web address] & Amputee sink HEP  Consulted and Agree with Plan of Care Patient          Patient will benefit from skilled therapeutic intervention in order to improve the following deficits and impairments:  Abnormal gait, Decreased activity tolerance, Decreased balance, Decreased endurance, Decreased knowledge of use of DME, Decreased mobility, Decreased skin integrity, Decreased strength, Prosthetic Dependency, Postural dysfunction, Pain  Visit Diagnosis: Other abnormalities of gait and mobility  Unsteadiness on feet  Muscle weakness (generalized)  Abnormal posture     Problem List Patient Active Problem List   Diagnosis Date Noted  . Elevated troponin 12/22/2018  . Acute exacerbation of  CHF (congestive heart failure) ([place]) 12/21/2018  . Gait abnormality 09/11/2018  . CAD S/P percutaneous coronary angioplasty 08/06/2018  . Cardiomyopathy ([place]) 08/06/2018  . PVD (peripheral vascular disease) ([place]) 08/06/2018  . Shortness of breath   . Acute on chronic combined systolic and diastolic CHF, NYHA class 2 ([place])   . History of NSTEMI 07/28/2018  . Respiratory failure with hypoxia ([place]) 07/28/2018  . Acute heart failure ([place]) 07/28/2018  . Hypertensive emergency 07/28/2018  . S/P BKA (below knee amputation) unilateral, right ([place]) 07/15/2018  . Hypoglycemia   . Anxiety about health   . Labile blood glucose   . Essential hypertension   . History of transmetatarsal amputation of left foot ([place])   . Hypoalbuminemia due to protein-calorie malnutrition ([place])   . Stage 3 chronic kidney disease   . Poorly controlled type 2 diabetes mellitus with peripheral neuropathy ([place])   . Acute blood loss anemia   . Anemia of chronic disease   . Leukocytosis   . Tobacco abuse   . Hyperkalemia 06/20/2018  . Diabetic polyneuropathy associated with type 2 diabetes mellitus ([place])   . Severe protein-calorie malnutrition ([place])   . AKI (acute kidney injury) ([place]) 06/17/2018  . Normocytic anemia 06/17/2018  . DM2 (diabetes mellitus, type 2) ([place]) 05/14/2018  . Viral pharyngitis 01/14/2017  . Cigarette smoker 01/14/2017  . Avascular necrosis of hip, left ([place]) 10/11/2016  . Status post left hip replacement 10/11/2016  . S/P transmetatarsal amputation of foot, left ([place]) 07/25/2016  . Balance problem 07/25/2016  . Fournier's gangrene 08/16/2011  . Acute respiratory failure ([place]) 08/16/2011  . Septic shock(785.52) 08/16/2011  . Insulin dependent diabetes mellitus with complications ([place]) 49/44/9675    Willow Ora, PTA, [place] [address] [place], [place] [address] [phone] 05/31/19, 6:46 PM   Name: Edward [name] MRN: [id number] Date of Birth:  09/18/1970

## 2019-06-02 ENCOUNTER — Ambulatory Visit: Payer: Medicare Other | Admitting: Physical Therapy

## 2019-06-04 ENCOUNTER — Ambulatory Visit: Payer: Medicare Other | Admitting: Physical Therapy

## 2019-06-04 ENCOUNTER — Encounter: Payer: Self-pay | Admitting: Physical Therapy

## 2019-06-04 ENCOUNTER — Other Ambulatory Visit: Payer: Self-pay

## 2019-06-04 DIAGNOSIS — R2689 Other abnormalities of gait and mobility: Secondary | ICD-10-CM | POA: Diagnosis not present

## 2019-06-04 DIAGNOSIS — M6281 Muscle weakness (generalized): Secondary | ICD-10-CM

## 2019-06-04 DIAGNOSIS — R293 Abnormal posture: Secondary | ICD-10-CM

## 2019-06-04 DIAGNOSIS — R2681 Unsteadiness on feet: Secondary | ICD-10-CM

## 2019-06-07 NOTE — Therapy (Signed)
Edward [name] [address] [place], [place], [address] Phone: [phone]   Fax:  [phone]  Physical Therapy Treatment  Patient Details  Name: Edward [name] MRN: [id number] Date of Birth: [birth year] Referring Provider (PT): [name], [place]   Encounter Date: 06/04/2019     06/04/19 9371  PT Visits / Re-Eval  Visit Number 18 (progress note due visit 26)  Number of Visits 40  Date for PT Re-Evaluation 08/18/19  Authorization  Authorization Type medicare/Medicaid. 10th visit progress note.  PT Time Calculation  PT Start Time [phone]  PT Stop Time 1015  PT Time Calculation (min) 42 min  PT - End of Session  Equipment Utilized During Treatment Gait belt  Activity Tolerance Patient tolerated treatment well;No increased pain;Patient limited by fatigue (elevated BP)  Behavior During Therapy [facility] for tasks assessed/performed    Past Medical History:  Diagnosis Date  . Anxiety    self reported  . Arthritis   . CHF (congestive heart failure) ([place])   . Coronary artery disease   . Depression    self reported  . Diabetes mellitus   . Hypertension     Past Surgical History:  Procedure Laterality Date  . AMPUTATION Left 05/10/2016   Procedure: Left Transmetatarsal Amputation;  Surgeon: [name], MD;  Location: [facility];  Service: Orthopedics;  Laterality: Left;  . AMPUTATION Right 05/16/2018   Procedure: PARTIAL RAY AMPUTATION FIFTH TOE RIGHT FOOT;  Surgeon: [name], DPM;  Location: [place];  Service: Podiatry;  Laterality: Right;  . AMPUTATION Right 06/17/2018   Procedure: AMPUTATION 4th RAY Right Foot;  Surgeon: Trula Slade, DPM;  Location: [place];  Service: Podiatry;  Laterality: Right;  . AMPUTATION Right 06/19/2018   Procedure: RIGHT BELOW KNEE AMPUTATION;  Surgeon: [name], MD;  Location: [facility];  Service: Orthopedics;  Laterality: Right;  . APPLICATION OF WOUND VAC Right 06/19/2018   Procedure: APPLICATION OF WOUND VAC;  Surgeon: [name], MD;  Location: [place] [facility];  Service: Orthopedics;  Laterality: Right;  . CARDIAC CATHETERIZATION  07/29/2018  . CIRCUMCISION  09/02/2011   Procedure: CIRCUMCISION ADULT;  Surgeon: Molli Hazard, MD;  Location: [facility];  Service: Urology;  Laterality: N/A;  . CORONARY STENT INTERVENTION N/A 07/29/2018   Procedure: CORONARY STENT INTERVENTION;  Surgeon: Leonie Man, MD;  Location: [place] [facility];  Service: Cardiovascular;  Laterality: N/A;  . I&D EXTREMITY  09/02/2011   Procedure: IRRIGATION AND DEBRIDEMENT EXTREMITY;  Surgeon: Theodoro Kos, DO;  Location: [place];  Service: Plastics;  Laterality: N/A;  . INCISION AND DRAINAGE OF WOUND  08/12/2011   Procedure: IRRIGATION AND DEBRIDEMENT WOUND;  Surgeon: Zenovia Jarred, MD;  Location: [facility];  Service: General;;  perineum  . IRRIGATION AND DEBRIDEMENT ABSCESS  08/12/2011   Procedure: IRRIGATION AND DEBRIDEMENT ABSCESS;  Surgeon: Molli Hazard, MD;  Location: [place];  Service: Urology;  Laterality: N/A;  irrigation and debridment perineum    . IRRIGATION AND DEBRIDEMENT ABSCESS  08/14/2011   Procedure: MINOR INCISION AND DRAINAGE OF ABSCESS;  Surgeon: Molli Hazard, MD;  Location: [facility];  Service: Urology;  Laterality: N/A;  Perineal Wound Debridement;Placement Bilateral Testicular Thigh Pouches  . LEFT HEART CATH AND CORONARY ANGIOGRAPHY N/A 07/29/2018   Procedure: LEFT HEART CATH AND CORONARY ANGIOGRAPHY;  Surgeon: Leonie Man, MD;  Location: [facility];  Service: Cardiovascular;  Laterality: N/A;  . TOTAL  HIP ARTHROPLASTY Left 10/11/2016   Procedure: LEFT TOTAL HIP ARTHROPLASTY ANTERIOR APPROACH;  Surgeon: Mcarthur Rossetti, MD;  Location: [facility];  Service: Orthopedics;  Laterality: Left;    There were no vitals filed for this visit.     06/04/19 0934  Symptoms/Limitations  Subjective Reports the other  bister has also opened up. He has seen the MD who put him on an antibiotic, gave him a cream to put on them and is to keep them covered. No falls or pain to report. Cancelled the other day because he didn't feel well- didn't sleep well and having increased acid reflux.  Pertinent History R TTA, L THA 10/11/2016, L transmetarsal amp 2017, CAD, CHF, MI, arthritis, DM, HTN, CKD stage III  Limitations Lifting;Walking;Standing;House hold activities  Patient Stated Goals to use prosthesis for balance & walking.   Pain Assessment  Currently in Pain? No/denies      06/04/19 0943  Transfers  Transfers Sit to Stand;Stand to Sit  Sit to Stand 5: Supervision;With upper extremity assist;With armrests;From chair/3-in-1  Stand to Sit 5: Supervision;With upper extremity assist;With armrests;To chair/3-in-1  Ambulation/Gait  Ambulation/Gait Yes  Ambulation/Gait Assistance 5: Supervision  Ambulation/Gait Assistance Details pt with reports of left LE cramping/tightening up for last 50 feet of gait distance, SaO2 94% once seated, HR 80's, dyspnea 3/4. seated rest improved the tightness of the left LE and improved his breathing   Ambulation Distance (Feet) 300 Feet (x1)  Assistive device Straight cane;Prosthesis  Gait Pattern Step-through pattern;Decreased step length - left;Decreased stance time - right;Decreased stride length;Decreased hip/knee flexion - right;Decreased weight shift to right;Right hip hike;Antalgic;Lateral hip instability;Trunk flexed;Abducted- right;Decreased arm swing - right;Right flexed knee in stance  Ambulation Surface Level;Indoor  Ramp Other (comment) (min guard assist)  Ramp Details (indicate cue type and reason) with cane/prosthesis, cues on sequencing and technique  Curb 4: Min assist  Curb Details (indicate cue type and reason) with cane/prosthesis, cues on stance position and technique  Knee/Hip Exercises: Aerobic  Other Aerobic Scifit level 2.0 UE/LE with 3 minutes work with  goal 50-60 rpm, 2 minute rest break- performed 3 cycles with SaO2 checked with rest times reading >/= 94%, HR 80's        PT Short Term Goals - 05/20/19 2204      PT SHORT TERM GOAL #1   Title  Patient verbalizes proper sweat  management of residual limb. (All updated STGs Target Date: 06/18/2019)    Time  4    Period  Weeks    Status  Revised    Target Date  06/18/19      PT SHORT TERM GOAL #2   Title  Berg Balance >36/56    Time  4    Period  Weeks    Status  New    Target Date  06/18/19      PT SHORT TERM GOAL #3   Title  Patient ambulates 300' outdoors on paved surfaces with prosthesis & cane with supervision.    Time  4    Period  Weeks    Status  Revised    Target Date  06/18/19      PT SHORT TERM GOAL #4   Title  Patient negotiates ramps & curbs with cane & prosthesis with supervision.    Time  4    Period  Weeks    Status  Revised    Target Date  06/18/19      PT SHORT TERM GOAL #5  Title  Patient verbalizes fall prevention strategies.    Time  4    Period  Weeks    Status  New    Target Date  06/18/19        PT Long Term Goals - 05/20/19 2210      PT LONG TERM GOAL #1   Title  Patient is independent with all aspects of prosthetic care &  tolerates wear prosthesis daily for greater than 90% of awake hours without skin integrity problems or limb discomfort to enable function throughout his day to enable safe utilization of the prosthesis  (All LTGs Target Date:  08/18/2019)    Time  12    Period  Weeks   visits   Status  On-going    Target Date  08/18/19      PT LONG TERM GOAL #2   Title  Dynamic Gait Index with prosthesis & cane >16/24    Time  12    Period  Weeks   visits   Status  New    Target Date  08/18/19      PT LONG TERM GOAL #3   Title  Berg balance > 45 /56 to indicate lower fall risk and less dependency in standing ADLs    Time  12    Period  Weeks   visits   Status  On-going    Target Date  08/18/19      PT LONG TERM GOAL #4    Title  patient ambulates 500 feet with LRAD and prosthesis modified independent to enable community access     Time  12    Period  Weeks   visits   Status  On-going    Target Date  08/18/19      PT LONG TERM GOAL #5   Title  patient negotiates ramps & curbs with LRAD and stairs with one rail and the prosthesis modified independent to enable community access     Time  12    Period  Weeks   visits   Status  On-going    Target Date  08/18/19         06/04/19 0071  Plan  Clinical Impression Statement Today's skilled session continued to focus on gait/barriers with cane/prosthesis and on activity tolerance. Pt able to increase his time on the Scifit this session and overall activity performed during the session. Wounds on non residual limb appear to be healing, pt has clear understanding of what the MD wants and his son in assisting with dressing changes at home. The pt is making progress toward goals and should benefit from continued PT to progress toward unmet goals.  Personal Factors and Comorbidities Comorbidity 3+;Fitness;Time since onset of injury/illness/exacerbation  Comorbidities R TTA, L THA 10/11/2016, L transmetarsal amp 2017, CAD, CHF, MI, arthritis, DM2, HTN, CKD stage III  Examination-Activity Limitations Caring for Others;Carry;Locomotion Level;Reach Overhead;Stairs;Stand;Transfers  Examination-Participation Restrictions Community Activity;Driving;Personal Finances  Pt will benefit from skilled therapeutic intervention in order to improve on the following deficits Abnormal gait;Decreased activity tolerance;Decreased balance;Decreased endurance;Decreased knowledge of use of DME;Decreased mobility;Decreased skin integrity;Decreased strength;Prosthetic Dependency;Postural dysfunction;Pain  Stability/Clinical Decision Making Evolving/Moderate complexity  Rehab Potential Good  PT Frequency 2x / week (1x/wk for 3 weeks, then 2x/wk for 6 weeks)  PT Duration 6 weeks (9 weeks)  PT  Treatment/Interventions ADLs/Self Care Home Management;DME Instruction;Gait training;Stair training;Functional mobility training;Canalith Repostioning;Therapeutic activities;Therapeutic exercise;Balance training;Neuromuscular re-education;Patient/family education;Prosthetic Training;Manual techniques;Vestibular  PT Next Visit Plan Monitor BP. Work towards updated STGs.  Continue  to work on balance, endurance for gait > 500' with cane, obstacles/ramp/curb with cane  PT Home Exercise Plan Access Code: [email or web address] & Amputee sink HEP  Consulted and Agree with Plan of Care Patient          Patient will benefit from skilled therapeutic intervention in order to improve the following deficits and impairments:  Abnormal gait, Decreased activity tolerance, Decreased balance, Decreased endurance, Decreased knowledge of use of DME, Decreased mobility, Decreased skin integrity, Decreased strength, Prosthetic Dependency, Postural dysfunction, Pain  Visit Diagnosis: Other abnormalities of gait and mobility  Unsteadiness on feet  Muscle weakness (generalized)  Abnormal posture     Problem List Patient Active Problem List   Diagnosis Date Noted  . Elevated troponin 12/22/2018  . Acute exacerbation of CHF (congestive heart failure) ([place]) 12/21/2018  . Gait abnormality 09/11/2018  . CAD S/P percutaneous coronary angioplasty 08/06/2018  . Cardiomyopathy ([place]) 08/06/2018  . PVD (peripheral vascular disease) ([place]) 08/06/2018  . Shortness of breath   . Acute on chronic combined systolic and diastolic CHF, NYHA class 2 ([place])   . History of NSTEMI 07/28/2018  . Respiratory failure with hypoxia ([place]) 07/28/2018  . Acute heart failure ([place]) 07/28/2018  . Hypertensive emergency 07/28/2018  . S/P BKA (below knee amputation) unilateral, right ([place]) 07/15/2018  . Hypoglycemia   . Anxiety about health   . Labile blood glucose   . Essential hypertension   . History of transmetatarsal amputation of left  foot ([place])   . Hypoalbuminemia due to protein-calorie malnutrition ([place])   . Stage 3 chronic kidney disease   . Poorly controlled type 2 diabetes mellitus with peripheral neuropathy ([place])   . Acute blood loss anemia   . Anemia of chronic disease   . Leukocytosis   . Tobacco abuse   . Hyperkalemia 06/20/2018  . Diabetic polyneuropathy associated with type 2 diabetes mellitus ([place])   . Severe protein-calorie malnutrition ([place])   . AKI (acute kidney injury) ([place]) 06/17/2018  . Normocytic anemia 06/17/2018  . DM2 (diabetes mellitus, type 2) ([place]) 05/14/2018  . Viral pharyngitis 01/14/2017  . Cigarette smoker 01/14/2017  . Avascular necrosis of hip, left ([place]) 10/11/2016  . Status post left hip replacement 10/11/2016  . S/P transmetatarsal amputation of foot, left ([place]) 07/25/2016  . Balance problem 07/25/2016  . Fournier's gangrene 08/16/2011  . Acute respiratory failure ([place]) 08/16/2011  . Septic shock(785.52) 08/16/2011  . Insulin dependent diabetes mellitus with complications ([place]) 20/05/711    Willow Ora, PTA, [facility] [address] [place], [place] [address] [phone] 06/07/19, 9:49 PM   Name: JEANMARC VIERNES MRN: [id number] Date of Birth: [birth year]

## 2019-06-09 ENCOUNTER — Other Ambulatory Visit: Payer: Self-pay

## 2019-06-09 ENCOUNTER — Ambulatory Visit: Payer: Medicare Other | Admitting: Physical Therapy

## 2019-06-09 ENCOUNTER — Encounter: Payer: Self-pay | Admitting: Physical Therapy

## 2019-06-09 VITALS — BP 159/95

## 2019-06-09 DIAGNOSIS — R2681 Unsteadiness on feet: Secondary | ICD-10-CM

## 2019-06-09 DIAGNOSIS — R2689 Other abnormalities of gait and mobility: Secondary | ICD-10-CM

## 2019-06-09 DIAGNOSIS — M79662 Pain in left lower leg: Secondary | ICD-10-CM

## 2019-06-09 DIAGNOSIS — M6281 Muscle weakness (generalized): Secondary | ICD-10-CM

## 2019-06-09 DIAGNOSIS — R293 Abnormal posture: Secondary | ICD-10-CM

## 2019-06-10 NOTE — Therapy (Signed)
[facility] [address] [place], [place], [address] Phone: [phone]   Fax:  [phone]  Physical Therapy Treatment  Patient Details  Name: Edward [name] MRN: [id number] Date of Birth: 1971-01-07 Referring Provider (PT): [name], [place]   Encounter Date: 06/09/2019     06/09/19 0935  PT Visits / Re-Eval  Visit Number 21 (progress note due visit 8)  Number of Visits 40  Date for PT Re-Evaluation 08/18/19  Authorization  Authorization Type medicare/Medicaid. 10th visit progress note.  PT Time Calculation  PT Start Time 0932  PT Stop Time 1013  PT Time Calculation (min) 41 min  PT - End of Session  Equipment Utilized During Treatment Gait belt  Activity Tolerance Patient tolerated treatment well;No increased pain;Patient limited by fatigue (elevated BP)  Behavior During Therapy [facility] for tasks assessed/performed    Past Medical History:  Diagnosis Date  . Anxiety    self reported  . Arthritis   . CHF (congestive heart failure) ([place])   . Coronary artery disease   . Depression    self reported  . Diabetes mellitus   . Hypertension     Past Surgical History:  Procedure Laterality Date  . AMPUTATION Left 05/10/2016   Procedure: Left Transmetatarsal Amputation;  Surgeon: [name], MD;  Location: [facility];  Service: Orthopedics;  Laterality: Left;  . AMPUTATION Right 05/16/2018   Procedure: PARTIAL RAY AMPUTATION FIFTH TOE RIGHT FOOT;  Surgeon: Edward Kins, DPM;  Location: [facility];  Service: Podiatry;  Laterality: Right;  . AMPUTATION Right 06/17/2018   Procedure: AMPUTATION 4th RAY Right Foot;  Surgeon: Trula Slade, DPM;  Location: [facility];  Service: Podiatry;  Laterality: Right;  . AMPUTATION Right 06/19/2018   Procedure: RIGHT BELOW KNEE AMPUTATION;  Surgeon: [name], MD;  Location: [facility];  Service: Orthopedics;  Laterality: Right;  . APPLICATION OF WOUND VAC Right 06/19/2018    Procedure: APPLICATION OF WOUND VAC;  Surgeon: [name], MD;  Location: [facility];  Service: Orthopedics;  Laterality: Right;  . CARDIAC CATHETERIZATION  07/29/2018  . CIRCUMCISION  09/02/2011   Procedure: CIRCUMCISION ADULT;  Surgeon: Molli Hazard, MD;  Location: [facility];  Service: Urology;  Laterality: N/A;  . CORONARY STENT INTERVENTION N/A 07/29/2018   Procedure: CORONARY STENT INTERVENTION;  Surgeon: Leonie Man, MD;  Location: [facility];  Service: Cardiovascular;  Laterality: N/A;  . I&D EXTREMITY  09/02/2011   Procedure: IRRIGATION AND DEBRIDEMENT EXTREMITY;  Surgeon: Theodoro Kos, DO;  Location: [facility];  Service: Plastics;  Laterality: N/A;  . INCISION AND DRAINAGE OF WOUND  08/12/2011   Procedure: IRRIGATION AND DEBRIDEMENT WOUND;  Surgeon: Zenovia Jarred, MD;  Location: [facility];  Service: General;;  perineum  . IRRIGATION AND DEBRIDEMENT ABSCESS  08/12/2011   Procedure: IRRIGATION AND DEBRIDEMENT ABSCESS;  Surgeon: Molli Hazard, MD;  Location: [facility];  Service: Urology;  Laterality: N/A;  irrigation and debridment perineum    . IRRIGATION AND DEBRIDEMENT ABSCESS  08/14/2011   Procedure: MINOR INCISION AND DRAINAGE OF ABSCESS;  Surgeon: Molli Hazard, MD;  Location: [facility];  Service: Urology;  Laterality: N/A;  Perineal Wound Debridement;Placement Bilateral Testicular Thigh Pouches  . LEFT HEART CATH AND CORONARY ANGIOGRAPHY N/A 07/29/2018   Procedure: LEFT HEART CATH AND CORONARY ANGIOGRAPHY;  Surgeon: Leonie Man, MD;  Location: [facility];  Service: Cardiovascular;  Laterality: N/A;  .  TOTAL HIP ARTHROPLASTY Left 10/11/2016   Procedure: LEFT TOTAL HIP ARTHROPLASTY ANTERIOR APPROACH;  Surgeon: Mcarthur Rossetti, MD;  Location: [facility];  Service: Orthopedics;  Laterality: Left;     Vitals:   06/09/19 0934 06/09/19 0947 06/09/19 1009  BP: (!) 160/99 (!) 148/95 (!) 159/95     06/09/19 0934   Symptoms/Limitations  Subjective No new complaints. No falls or pain to report. Followed up with MD regarding wounds on left LE, reports session went well and wounds are healing.  Pertinent History R TTA, L THA 10/11/2016, L transmetarsal amp 2017, CAD, CHF, MI, arthritis, DM, HTN, CKD stage III  Limitations Lifting;Walking;Standing;House hold activities  Patient Stated Goals to use prosthesis for balance & walking.   Pain Assessment  Currently in Pain? No/denies  Pain Score 0       06/09/19 0936  Transfers  Transfers Sit to Stand;Stand to Sit  Sit to Stand 5: Supervision;With upper extremity assist;With armrests;From chair/3-in-1  Stand to Sit 5: Supervision;With upper extremity assist;With armrests;To chair/3-in-1  Ambulation/Gait  Ambulation/Gait Yes  Ambulation/Gait Assistance 5: Supervision;4: Min guard;4: Min assist  Ambulation/Gait Assistance Details increased assistance needed as distanced progressed with gait outdoors on paved sufaces up to min assist for last 50 feet with 2 standing rest breaks taken. otherwise pt was supervision to min guard assist for gait.   Ambulation Distance (Feet) 350 Feet (x1, plus around gym with activity)  Assistive device Straight cane;Prosthesis  Gait Pattern Step-through pattern;Decreased step length - left;Decreased stance time - right;Decreased stride length;Decreased hip/knee flexion - right;Decreased weight shift to right;Right hip hike;Antalgic;Lateral hip instability;Trunk flexed;Abducted- right;Decreased arm swing - right;Right flexed knee in stance  Ambulation Surface Level;Unlevel;Indoor;Outdoor;Paved  Ramp Other (comment) (min guard assist)  Ramp Details (indicate cue type and reason) with cane/prosthesis on outdoor inclines/declines  Knee/Hip Exercises: Aerobic  Other Aerobic Scifit level 2.5 UE/LE with 3 minutes work with goal 50-60 rpm, 2 minute rest break- performed 3 cycles with SaO2 checked with rest times reading >/= 94%, HR 80's    Prosthetics  Current prosthetic wear tolerance (days/week)  daily  Current prosthetic wear tolerance (#hours/day)  most awake hours, drying as needed  Residual limb condition  intact with no issues  Education Provided Residual limb care;Care of non-amputated limb;Proper wear schedule/adjustment;Proper weight-bearing schedule/adjustment  Person(s) Educated Patient  Education Method Explanation;Demonstration;Verbal cues  Education Method Verbalized understanding;Returned demonstration;Verbal cues required  Donning Prosthesis 6  Doffing Prosthesis 6        PT Short Term Goals - 05/20/19 2204      PT SHORT TERM GOAL #1   Title  Patient verbalizes proper sweat  management of residual limb. (All updated STGs Target Date: 06/18/2019)    Time  4    Period  Weeks    Status  Revised    Target Date  06/18/19      PT SHORT TERM GOAL #2   Title  Berg Balance >36/56    Time  4    Period  Weeks    Status  New    Target Date  06/18/19      PT SHORT TERM GOAL #3   Title  Patient ambulates 300' outdoors on paved surfaces with prosthesis & cane with supervision.    Time  4    Period  Weeks    Status  Revised    Target Date  06/18/19      PT SHORT TERM GOAL #4   Title  Patient negotiates ramps & curbs  with cane & prosthesis with supervision.    Time  4    Period  Weeks    Status  Revised    Target Date  06/18/19      PT SHORT TERM GOAL #5   Title  Patient verbalizes fall prevention strategies.    Time  4    Period  Weeks    Status  New    Target Date  06/18/19        PT Long Term Goals - 05/20/19 2210      PT LONG TERM GOAL #1   Title  Patient is independent with all aspects of prosthetic care &  tolerates wear prosthesis daily for greater than 90% of awake hours without skin integrity problems or limb discomfort to enable function throughout his day to enable safe utilization of the prosthesis  (All LTGs Target Date:  08/18/2019)    Time  12    Period  Weeks   visits    Status  On-going    Target Date  08/18/19      PT LONG TERM GOAL #2   Title  Dynamic Gait Index with prosthesis & cane >16/24    Time  12    Period  Weeks   visits   Status  New    Target Date  08/18/19      PT LONG TERM GOAL #3   Title  Berg balance > 45 /56 to indicate lower fall risk and less dependency in standing ADLs    Time  12    Period  Weeks   visits   Status  On-going    Target Date  08/18/19      PT LONG TERM GOAL #4   Title  patient ambulates 500 feet with LRAD and prosthesis modified independent to enable community access     Time  12    Period  Weeks   visits   Status  On-going    Target Date  08/18/19      PT LONG TERM GOAL #5   Title  patient negotiates ramps & curbs with LRAD and stairs with one rail and the prosthesis modified independent to enable community access     Time  12    Period  Weeks   visits   Status  On-going    Target Date  08/18/19         06/09/19 0935  Plan  Clinical Impression Statement Today's skilled session continued to focus on gait, strengthening and activity tolerance. Pt able to increase his consecutive distance and overall gait distance this session. The pt is making steady progress toward goals and should benefit from continued PT to progress toward unmet goals.  Personal Factors and Comorbidities Comorbidity 3+;Fitness;Time since onset of injury/illness/exacerbation  Comorbidities R TTA, L THA 10/11/2016, L transmetarsal amp 2017, CAD, CHF, MI, arthritis, DM2, HTN, CKD stage III  Examination-Activity Limitations Caring for Others;Carry;Locomotion Level;Reach Overhead;Stairs;Stand;Transfers  Examination-Participation Restrictions Community Activity;Driving;Personal Finances  Pt will benefit from skilled therapeutic intervention in order to improve on the following deficits Abnormal gait;Decreased activity tolerance;Decreased balance;Decreased endurance;Decreased knowledge of use of DME;Decreased mobility;Decreased skin  integrity;Decreased strength;Prosthetic Dependency;Postural dysfunction;Pain  Stability/Clinical Decision Making Evolving/Moderate complexity  Rehab Potential Good  PT Frequency 2x / week (1x/wk for 3 weeks, then 2x/wk for 6 weeks)  PT Duration 6 weeks (9 weeks)  PT Treatment/Interventions ADLs/Self Care Home Management;DME Instruction;Gait training;Stair training;Functional mobility training;Canalith Repostioning;Therapeutic activities;Therapeutic exercise;Balance training;Neuromuscular re-education;Patient/family education;Prosthetic Training;Manual techniques;Vestibular  PT Next Visit  Plan Monitor BP. Work towards updated STGs.  Continue to work on balance, endurance for gait > 500' with cane, obstacles/ramp/curb with cane  PT Home Exercise Plan Access Code: [email or web address] & Amputee sink HEP  Consulted and Agree with Plan of Care Patient      Patient will benefit from skilled therapeutic intervention in order to improve the following deficits and impairments:  Abnormal gait, Decreased activity tolerance, Decreased balance, Decreased endurance, Decreased knowledge of use of DME, Decreased mobility, Decreased skin integrity, Decreased strength, Prosthetic Dependency, Postural dysfunction, Pain  Visit Diagnosis: Other abnormalities of gait and mobility  Unsteadiness on feet  Muscle weakness (generalized)  Abnormal posture  Pain in left lower leg     Problem List Patient Active Problem List   Diagnosis Date Noted  . Elevated troponin 12/22/2018  . Acute exacerbation of CHF (congestive heart failure) ([place]) 12/21/2018  . Gait abnormality 09/11/2018  . CAD S/P percutaneous coronary angioplasty 08/06/2018  . Cardiomyopathy ([place]) 08/06/2018  . PVD (peripheral vascular disease) ([place]) 08/06/2018  . Shortness of breath   . Acute on chronic combined systolic and diastolic CHF, NYHA class 2 ([place])   . History of NSTEMI 07/28/2018  . Respiratory failure with hypoxia ([place]) 07/28/2018  . Acute  heart failure ([place]) 07/28/2018  . Hypertensive emergency 07/28/2018  . S/P BKA (below knee amputation) unilateral, right ([place]) 07/15/2018  . Hypoglycemia   . Anxiety about health   . Labile blood glucose   . Essential hypertension   . History of transmetatarsal amputation of left foot ([place])   . Hypoalbuminemia due to protein-calorie malnutrition ([place])   . Stage 3 chronic kidney disease   . Poorly controlled type 2 diabetes mellitus with peripheral neuropathy ([facility])   . Acute blood loss anemia   . Anemia of chronic disease   . Leukocytosis   . Tobacco abuse   . Hyperkalemia 06/20/2018  . Diabetic polyneuropathy associated with type 2 diabetes mellitus ([place])   . Severe protein-calorie malnutrition ([place])   . AKI (acute kidney injury) ([place]) 06/17/2018  . Normocytic anemia 06/17/2018  . DM2 (diabetes mellitus, type 2) ([place]) 05/14/2018  . Viral pharyngitis 01/14/2017  . Cigarette smoker 01/14/2017  . Avascular necrosis of hip, left ([place]) 10/11/2016  . Status post left hip replacement 10/11/2016  . S/P transmetatarsal amputation of foot, left ([place]) 07/25/2016  . Balance problem 07/25/2016  . Fournier's gangrene 08/16/2011  . Acute respiratory failure ([place]) 08/16/2011  . Septic shock(785.52) 08/16/2011  . Insulin dependent diabetes mellitus with complications ([place]) 34/10/7094    Willow Ora, PTA, [facility] [address] [place], [place] [address] [phone] 06/10/19, 9:51 PM   Name: Edward [name] MRN: [id number] Date of Birth: [birth year]

## 2019-06-11 ENCOUNTER — Ambulatory Visit: Payer: Medicare Other | Admitting: Physical Therapy

## 2019-06-11 ENCOUNTER — Telehealth: Payer: Self-pay | Admitting: Physical Therapy

## 2019-06-11 NOTE — Telephone Encounter (Signed)
Called pt about his missed PT appointment today. Left a message about today's missed appointment and date/time of his next appointment. Requested pt call to cancel if he can not make that appointment and left the office phone number.   Willow Ora, PTA, [facility] [address], [place], [place] [address] [phone] 06/11/19, 9:47 AM

## 2019-06-18 ENCOUNTER — Ambulatory Visit: Payer: Medicare Other | Admitting: Physical Therapy

## 2019-06-18 ENCOUNTER — Encounter: Payer: Self-pay | Admitting: Physical Therapy

## 2019-06-18 ENCOUNTER — Other Ambulatory Visit: Payer: Self-pay

## 2019-06-18 VITALS — BP 162/102

## 2019-06-18 NOTE — Therapy (Signed)
[facility] [address] [place], [place], [address] Phone: [phone]   Fax:  [phone]  Physical Therapy Treatment  Patient Details  Name: Edward [name] MRN: [id number] Date of Birth: 1971-01-19 Referring Provider (PT): [name], [place]   Encounter Date: 06/18/2019  PT End of Session - 06/18/19 7619    Visit Number  20    Number of Visits  40    Date for PT Re-Evaluation  08/18/19    Authorization Type  medicare/Medicaid. 10th visit progress note.    PT Start Time  [phone]   no charge due to elevated blood pressure   PT Stop Time  1000    PT Time Calculation (min)  29 min    Equipment Utilized During Treatment  Gait belt    Activity Tolerance  Patient tolerated treatment well;No increased pain;Patient limited by fatigue   elevated BP   Behavior During Therapy  [facility] for tasks assessed/performed       Past Medical History:  Diagnosis Date  . Anxiety    self reported  . Arthritis   . CHF (congestive heart failure) ([place])   . Coronary artery disease   . Depression    self reported  . Diabetes mellitus   . Hypertension     Past Surgical History:  Procedure Laterality Date  . AMPUTATION Left 05/10/2016   Procedure: Left Transmetatarsal Amputation;  Surgeon: [name], MD;  Location: [place];  Service: Orthopedics;  Laterality: Left;  . AMPUTATION Right 05/16/2018   Procedure: PARTIAL RAY AMPUTATION FIFTH TOE RIGHT FOOT;  Surgeon: [name], DPM;  Location: [place];  Service: Podiatry;  Laterality: Right;  . AMPUTATION Right 06/17/2018   Procedure: AMPUTATION 4th RAY Right Foot;  Surgeon: Trula Slade, DPM;  Location: [place];  Service: Podiatry;  Laterality: Right;  . AMPUTATION Right 06/19/2018   Procedure: RIGHT BELOW KNEE AMPUTATION;  Surgeon: [name], MD;  Location: [place];  Service: Orthopedics;  Laterality: Right;  . APPLICATION OF WOUND VAC Right 06/19/2018   Procedure: APPLICATION OF  WOUND VAC;  Surgeon: [name], MD;  Location: [place];  Service: Orthopedics;  Laterality: Right;  . CARDIAC CATHETERIZATION  07/29/2018  . CIRCUMCISION  09/02/2011   Procedure: CIRCUMCISION ADULT;  Surgeon: Molli Hazard, MD;  Location: [place];  Service: Urology;  Laterality: N/A;  . CORONARY STENT INTERVENTION N/A 07/29/2018   Procedure: CORONARY STENT INTERVENTION;  Surgeon: Leonie Man, MD;  Location: [place];  Service: Cardiovascular;  Laterality: N/A;  . I&D EXTREMITY  09/02/2011   Procedure: IRRIGATION AND DEBRIDEMENT EXTREMITY;  Surgeon: Theodoro Kos, DO;  Location: [place] [facility];  Service: Plastics;  Laterality: N/A;  . INCISION AND DRAINAGE OF WOUND  08/12/2011   Procedure: IRRIGATION AND DEBRIDEMENT WOUND;  Surgeon: Zenovia Jarred, MD;  Location: [facility];  Service: General;;  perineum  . IRRIGATION AND DEBRIDEMENT ABSCESS  08/12/2011   Procedure: IRRIGATION AND DEBRIDEMENT ABSCESS;  Surgeon: Molli Hazard, MD;  Location: [facility];  Service: Urology;  Laterality: N/A;  irrigation and debridment perineum    . IRRIGATION AND DEBRIDEMENT ABSCESS  08/14/2011   Procedure: MINOR INCISION AND DRAINAGE OF ABSCESS;  Surgeon: Molli Hazard, MD;  Location: [place];  Service: Urology;  Laterality: N/A;  Perineal Wound Debridement;Placement Bilateral Testicular Thigh Pouches  . LEFT HEART CATH AND CORONARY ANGIOGRAPHY N/A 07/29/2018   Procedure: LEFT HEART CATH AND  CORONARY ANGIOGRAPHY;  Surgeon: Leonie Man, MD;  Location: [place];  Service: Cardiovascular;  Laterality: N/A;  . TOTAL HIP ARTHROPLASTY Left 10/11/2016   Procedure: LEFT TOTAL HIP ARTHROPLASTY ANTERIOR APPROACH;  Surgeon: Mcarthur Rossetti, MD;  Location: [facility];  Service: Orthopedics;  Laterality: Left;    Vitals:   06/18/19 0935 06/18/19 0952 06/18/19 0957  BP: (!) 152/103 (!) 160/104 (!) 162/102    Subjective Assessment - 06/18/19 0935     Subjective  No new complaints. No falls or pain to report. Does report "not feeling well" today, tired as he did not sleep last night.    Pertinent History  R TTA, L THA 10/11/2016, L transmetarsal amp 2017, CAD, CHF, MI, arthritis, DM, HTN, CKD stage III    Limitations  Lifting;Walking;Standing;House hold activities    Patient Stated Goals  to use prosthesis for balance & walking.     Currently in Pain?  No/denies    Pain Score  0-No pain             PT Short Term Goals - 05/20/19 2204      PT SHORT TERM GOAL #1   Title  Patient verbalizes proper sweat  management of residual limb. (All updated STGs Target Date: 06/18/2019)    Time  4    Period  Weeks    Status  Revised    Target Date  06/18/19      PT SHORT TERM GOAL #2   Title  Berg Balance >36/56    Time  4    Period  Weeks    Status  New    Target Date  06/18/19      PT SHORT TERM GOAL #3   Title  Patient ambulates 300' outdoors on paved surfaces with prosthesis & cane with supervision.    Time  4    Period  Weeks    Status  Revised    Target Date  06/18/19      PT SHORT TERM GOAL #4   Title  Patient negotiates ramps & curbs with cane & prosthesis with supervision.    Time  4    Period  Weeks    Status  Revised    Target Date  06/18/19      PT SHORT TERM GOAL #5   Title  Patient verbalizes fall prevention strategies.    Time  4    Period  Weeks    Status  New    Target Date  06/18/19        PT Long Term Goals - 05/20/19 2210      PT LONG TERM GOAL #1   Title  Patient is independent with all aspects of prosthetic care &  tolerates wear prosthesis daily for greater than 90% of awake hours without skin integrity problems or limb discomfort to enable function throughout his day to enable safe utilization of the prosthesis  (All LTGs Target Date:  08/18/2019)    Time  12    Period  Weeks   visits   Status  On-going    Target Date  08/18/19      PT LONG TERM GOAL #2   Title  Dynamic Gait Index with  prosthesis & cane >16/24    Time  12    Period  Weeks   visits   Status  New    Target Date  08/18/19      PT LONG TERM GOAL #3   Title  Berg balance > 45 /56 to indicate lower fall risk and less dependency in standing ADLs    Time  12    Period  Weeks   visits   Status  On-going    Target Date  08/18/19      PT LONG TERM GOAL #4   Title  patient ambulates 500 feet with LRAD and prosthesis modified independent to enable community access     Time  12    Period  Weeks   visits   Status  On-going    Target Date  08/18/19      PT LONG TERM GOAL #5   Title  patient negotiates ramps & curbs with LRAD and stairs with one rail and the prosthesis modified independent to enable community access     Time  12    Period  Weeks   visits   Status  On-going    Target Date  08/18/19            Plan - 06/18/19 8416    Clinical Impression Statement  Pt's initial BP with automated cuff was elevated. Had pt sit quietly while sipping water with manual recheck still elevated. Continue with quiet sitting with sipping water with second manual recheck still elevated. Pt did report not taking meds this morining as he took them "late last night". Session ended due to BP not at a safe level for activity. Advised pt to take meds when he gets home. Pt verbalized understanding.    Personal Factors and Comorbidities  Comorbidity 3+;Fitness;Time since onset of injury/illness/exacerbation    Comorbidities  R TTA, L THA 10/11/2016, L transmetarsal amp 2017, CAD, CHF, MI, arthritis, DM2, HTN, CKD stage III    Examination-Activity Limitations  Caring for Others;Carry;Locomotion Level;Reach Overhead;Stairs;Stand;Transfers    Examination-Participation Restrictions  Community Activity;Driving;Personal Finances    Stability/Clinical Decision Making  Evolving/Moderate complexity    Rehab Potential  Good    PT Frequency  2x / week   1x/wk for 3 weeks, then 2x/wk for 6 weeks   PT Duration  6 weeks   9 weeks   PT  Treatment/Interventions  ADLs/Self Care Home Management;DME Instruction;Gait training;Stair training;Functional mobility training;Canalith Repostioning;Therapeutic activities;Therapeutic exercise;Balance training;Neuromuscular re-education;Patient/family education;Prosthetic Training;Manual techniques;Vestibular    PT Next Visit Plan  Monitor BP. Work towards updated STGs.  Continue to work on balance, endurance for gait > 500' with cane, obstacles/ramp/curb with cane    PT Home Exercise Plan  Access Code: [email or web address] & Amputee sink HEP    Consulted and Agree with Plan of Care  Patient       Patient will benefit from skilled therapeutic intervention in order to improve the following deficits and impairments:  Abnormal gait, Decreased activity tolerance, Decreased balance, Decreased endurance, Decreased knowledge of use of DME, Decreased mobility, Decreased skin integrity, Decreased strength, Prosthetic Dependency, Postural dysfunction, Pain  Visit Diagnosis: Other abnormalities of gait and mobility  Unsteadiness on feet  Muscle weakness (generalized)  Abnormal posture     Problem List Patient Active Problem List   Diagnosis Date Noted  . Elevated troponin 12/22/2018  . Acute exacerbation of CHF (congestive heart failure) ([place]) 12/21/2018  . Gait abnormality 09/11/2018  . CAD S/P percutaneous coronary angioplasty 08/06/2018  . Cardiomyopathy ([place]) 08/06/2018  . PVD (peripheral vascular disease) ([place]) 08/06/2018  . Shortness of breath   . Acute on chronic combined systolic and diastolic CHF, NYHA class 2 ([place])   . History of NSTEMI 07/28/2018  . Respiratory failure with hypoxia (  [place]) 07/28/2018  . Acute heart failure ([place]) 07/28/2018  . Hypertensive emergency 07/28/2018  . S/P BKA (below knee amputation) unilateral, right ([place]) 07/15/2018  . Hypoglycemia   . Anxiety about health   . Labile blood glucose   . Essential hypertension   . History of transmetatarsal amputation of left  foot ([place])   . Hypoalbuminemia due to protein-calorie malnutrition ([place])   . Stage 3 chronic kidney disease   . Poorly controlled type 2 diabetes mellitus with peripheral neuropathy ([place])   . Acute blood loss anemia   . Anemia of chronic disease   . Leukocytosis   . Tobacco abuse   . Hyperkalemia 06/20/2018  . Diabetic polyneuropathy associated with type 2 diabetes mellitus ([place])   . Severe protein-calorie malnutrition ([place])   . AKI (acute kidney injury) ([place]) 06/17/2018  . Normocytic anemia 06/17/2018  . DM2 (diabetes mellitus, type 2) ([place]) 05/14/2018  . Viral pharyngitis 01/14/2017  . Cigarette smoker 01/14/2017  . Avascular necrosis of hip, left ([place]) 10/11/2016  . Status post left hip replacement 10/11/2016  . S/P transmetatarsal amputation of foot, left ([place]) 07/25/2016  . Balance problem 07/25/2016  . Fournier's gangrene 08/16/2011  . Acute respiratory failure ([place]) 08/16/2011  . Septic [name]([id number]) 08/16/2011  . Insulin dependent diabetes mellitus with complications ([place]) 06/18/5944    Willow Ora, PTA, [facility] [address] [place], [place] [address] [phone] 06/18/19, 10:06 AM   Name: Edward [name] MRN: [id number] Date of Birth: 1971/04/11

## 2019-06-23 ENCOUNTER — Other Ambulatory Visit: Payer: Self-pay | Admitting: Nephrology

## 2019-06-23 ENCOUNTER — Encounter: Payer: Medicare Other | Admitting: Physical Therapy

## 2019-06-23 ENCOUNTER — Ambulatory Visit: Payer: Medicare Other | Attending: Physician Assistant | Admitting: Physical Therapy

## 2019-06-23 DIAGNOSIS — N1832 Chronic kidney disease, stage 3b: Secondary | ICD-10-CM

## 2019-06-24 ENCOUNTER — Ambulatory Visit: Payer: Medicare Other | Admitting: Physical Therapy

## 2019-06-25 ENCOUNTER — Ambulatory Visit: Payer: Medicare Other | Admitting: Physical Therapy

## 2019-06-28 ENCOUNTER — Encounter: Payer: Medicare Other | Admitting: Physical Therapy

## 2019-06-28 MED FILL — FUROSEMIDE 40 MG TAB: 40 | 30 days supply | Qty: 120 | Fill #0

## 2019-06-29 ENCOUNTER — Encounter: Payer: Medicare Other | Admitting: Physical Therapy

## 2019-06-30 ENCOUNTER — Encounter: Payer: Self-pay | Admitting: Physical Therapy

## 2019-06-30 ENCOUNTER — Encounter: Payer: Medicare Other | Admitting: Physical Therapy

## 2019-06-30 ENCOUNTER — Ambulatory Visit: Payer: Medicare Other | Admitting: Physical Therapy

## 2019-06-30 ENCOUNTER — Telehealth: Payer: Self-pay | Admitting: Physical Therapy

## 2019-06-30 NOTE — Telephone Encounter (Signed)
PT spoke to patient as he has missed several appointments. He reports cough for last few weeks. He denies fever. He has not seen a doctor. He reports he was just trying to wait it out. PT strongly recommended seeing his physician and probably having a COVID test. Patient reports that he will call his MD.  Jamey Reas, PT, DPT PT Specializing in [place] 06/30/2019@ 12:14 PM Phone:  [phone]  Fax:  [phone] [facility] [address] [place], [place] [address]

## 2019-07-01 ENCOUNTER — Encounter: Payer: Medicare Other | Admitting: Physical Therapy

## 2019-07-02 ENCOUNTER — Ambulatory Visit: Payer: Medicare Other | Admitting: Physical Therapy

## 2019-07-05 ENCOUNTER — Encounter: Payer: Medicare Other | Admitting: Physical Therapy

## 2019-07-07 ENCOUNTER — Ambulatory Visit: Payer: Medicare Other | Admitting: Physical Therapy

## 2019-07-07 ENCOUNTER — Encounter: Payer: Medicare Other | Admitting: Physical Therapy

## 2019-07-08 ENCOUNTER — Encounter: Payer: Medicare Other | Admitting: Physical Therapy

## 2019-07-09 ENCOUNTER — Ambulatory Visit: Payer: Medicare Other | Admitting: Physical Therapy

## 2019-07-12 ENCOUNTER — Ambulatory Visit
Admission: RE | Admit: 2019-07-12 | Discharge: 2019-07-12 | Disposition: A | Discharge status: Home / Self Care | Payer: Medicare Other | Source: Ambulatory Visit | Attending: Nephrology | Admitting: Nephrology

## 2019-07-12 ENCOUNTER — Encounter: Payer: Medicare Other | Admitting: Physical Therapy

## 2019-07-12 DIAGNOSIS — N1832 Chronic kidney disease, stage 3b: Secondary | ICD-10-CM

## 2019-07-13 ENCOUNTER — Encounter: Payer: Medicare Other | Admitting: Physical Therapy

## 2019-07-14 ENCOUNTER — Encounter: Payer: Medicare Other | Admitting: Physical Therapy

## 2019-07-14 ENCOUNTER — Ambulatory Visit: Payer: Medicare Other | Admitting: Physical Therapy

## 2019-07-19 ENCOUNTER — Encounter: Payer: Medicare Other | Admitting: Physical Therapy

## 2019-07-20 ENCOUNTER — Encounter: Payer: Medicare Other | Admitting: Physical Therapy

## 2019-07-21 ENCOUNTER — Other Ambulatory Visit: Payer: Self-pay

## 2019-07-21 ENCOUNTER — Ambulatory Visit: Payer: Medicare Other | Attending: Physician Assistant | Admitting: Physical Therapy

## 2019-07-21 ENCOUNTER — Encounter: Payer: Medicare Other | Admitting: Physical Therapy

## 2019-07-21 ENCOUNTER — Encounter: Payer: Self-pay | Admitting: Physical Therapy

## 2019-07-21 DIAGNOSIS — M79662 Pain in left lower leg: Secondary | ICD-10-CM | POA: Insufficient documentation

## 2019-07-21 DIAGNOSIS — R2689 Other abnormalities of gait and mobility: Secondary | ICD-10-CM | POA: Diagnosis not present

## 2019-07-21 DIAGNOSIS — R293 Abnormal posture: Secondary | ICD-10-CM | POA: Insufficient documentation

## 2019-07-21 DIAGNOSIS — Z9181 History of falling: Secondary | ICD-10-CM | POA: Diagnosis present

## 2019-07-21 DIAGNOSIS — R2681 Unsteadiness on feet: Secondary | ICD-10-CM | POA: Diagnosis present

## 2019-07-21 DIAGNOSIS — M6281 Muscle weakness (generalized): Secondary | ICD-10-CM | POA: Diagnosis present

## 2019-07-21 NOTE — Therapy (Signed)
[facility] [address] [place], [place], [address] Phone: [phone]   Fax:  [phone]  Physical Therapy Treatment  Patient Details  Name: SAMEUL TAGLE MRN: [id number] Date of Birth: [birth year] Referring Provider (PT): [name], [place]   Encounter Date: 07/21/2019  PT End of Session - 07/21/19 1308    Visit Number  21    Number of Visits  40    Date for PT Re-Evaluation  08/18/19    Authorization Type  medicare/Medicaid. 10th visit progress note.    PT Start Time  1028    PT Stop Time  1106    PT Time Calculation (min)  38 min    Equipment Utilized During Treatment  Gait belt    Activity Tolerance  Patient tolerated treatment well   elevated BP   Behavior During Therapy  WFL for tasks assessed/performed       Past Medical History:  Diagnosis Date  . Anxiety    self reported  . Arthritis   . CHF (congestive heart failure) ([place])   . Coronary artery disease   . Depression    self reported  . Diabetes mellitus   . Hypertension     Past Surgical History:  Procedure Laterality Date  . AMPUTATION Left 05/10/2016   Procedure: Left Transmetatarsal Amputation;  Surgeon: [name], MD;  Location: [facility];  Service: Orthopedics;  Laterality: Left;  . AMPUTATION Right 05/16/2018   Procedure: PARTIAL RAY AMPUTATION FIFTH TOE RIGHT FOOT;  Surgeon: [name], DPM;  Location: [facility];  Service: Podiatry;  Laterality: Right;  . AMPUTATION Right 06/17/2018   Procedure: AMPUTATION 4th RAY Right Foot;  Surgeon: Trula Slade, DPM;  Location: [facility];  Service: Podiatry;  Laterality: Right;  . AMPUTATION Right 06/19/2018   Procedure: RIGHT BELOW KNEE AMPUTATION;  Surgeon: [name], MD;  Location: [facility];  Service: Orthopedics;  Laterality: Right;  . APPLICATION OF WOUND VAC Right 06/19/2018   Procedure: APPLICATION OF WOUND VAC;  Surgeon: [name], MD;  Location: [facility];  Service: Orthopedics;   Laterality: Right;  . CARDIAC CATHETERIZATION  07/29/2018  . CIRCUMCISION  09/02/2011   Procedure: CIRCUMCISION ADULT;  Surgeon: Molli Hazard, MD;  Location: [facility];  Service: Urology;  Laterality: N/A;  . CORONARY STENT INTERVENTION N/A 07/29/2018   Procedure: CORONARY STENT INTERVENTION;  Surgeon: Leonie Man, MD;  Location: [facility];  Service: Cardiovascular;  Laterality: N/A;  . I&D EXTREMITY  09/02/2011   Procedure: IRRIGATION AND DEBRIDEMENT EXTREMITY;  Surgeon: Theodoro Kos, DO;  Location: [facility];  Service: Plastics;  Laterality: N/A;  . INCISION AND DRAINAGE OF WOUND  08/12/2011   Procedure: IRRIGATION AND DEBRIDEMENT WOUND;  Surgeon: Zenovia Jarred, MD;  Location: [facility];  Service: General;;  perineum  . IRRIGATION AND DEBRIDEMENT ABSCESS  08/12/2011   Procedure: IRRIGATION AND DEBRIDEMENT ABSCESS;  Surgeon: Molli Hazard, MD;  Location: [facility];  Service: Urology;  Laterality: N/A;  irrigation and debridment perineum    . IRRIGATION AND DEBRIDEMENT ABSCESS  08/14/2011   Procedure: MINOR INCISION AND DRAINAGE OF ABSCESS;  Surgeon: Molli Hazard, MD;  Location: [facility];  Service: Urology;  Laterality: N/A;  Perineal Wound Debridement;Placement Bilateral Testicular Thigh Pouches  . LEFT HEART CATH AND CORONARY ANGIOGRAPHY N/A 07/29/2018   Procedure: LEFT HEART CATH AND CORONARY ANGIOGRAPHY;  Surgeon: Leonie Man, MD;  Location: [facility];  Service: Cardiovascular;  Laterality: N/A;  . TOTAL HIP ARTHROPLASTY Left 10/11/2016   Procedure: LEFT TOTAL HIP ARTHROPLASTY ANTERIOR APPROACH;  Surgeon: Mcarthur Rossetti, MD;  Location: [facility];  Service: Orthopedics;  Laterality: Left;    There were no vitals filed for this visit.  Subjective Assessment - 07/21/19 1029    Subjective  Was very sick for a few weeks so he didn't come to therapy - never went and got tested for COVID.  Never ran a fever.  No  falls during that time.  Feels like breathing is back to normal.  Would like to decrease to one time a week.    Pertinent History  R TTA, L THA 10/11/2016, L transmetarsal amp 2017, CAD, CHF, MI, arthritis, DM, HTN, CKD stage III    Limitations  Lifting;Walking;Standing;House hold activities    Patient Stated Goals  to use prosthesis for balance & walking.     Currently in Pain?  No/denies         [facility] PT Assessment - 07/21/19 1033      Assessment   Medical Diagnosis  Right Transtibial Amputation    Referring Provider (PT)  Shawn Rayburn, PA    Onset Date/Surgical Date  12/24/18      Ambulation/Gait   Ambulation/Gait  Yes    Stairs  Yes    Stairs Assistance  5: Supervision;4: Min assist    Stairs Assistance Details (indicate cue type and reason)  supervision when performing step to sequencing, min A when training for alternating sequence; verbal cues for how to perform descending safely with R toe over edge of step when descending with LLE.  Pt required total verbal cues to sequence step over step    Stair Management Technique  One rail Left;Step to pattern;Alternating pattern;Forwards;With cane    Number of Stairs  12    Height of Stairs  6    Ramp  4: Min assist    Ramp Details (indicate cue type and reason)  with cane/prosthesis. Step through when ascending, step to when descending    Curb  4: Min assist    Curb Details (indicate cue type and reason)  with cane/prosthesis, verbal cues to sequence.      Standardized Balance Assessment   Standardized Balance Assessment  Berg Balance Test      Berg Balance Test   Sit to Stand  Able to stand  independently using hands    Standing Unsupported  Able to stand 2 minutes with supervision    Sitting with Back Unsupported but Feet Supported on Floor or Stool  Able to sit safely and securely 2 minutes    Stand to Sit  Controls descent by using hands    Transfers  Able to transfer safely, definite need of hands    Standing Unsupported  with Eyes Closed  Able to stand 3 seconds    Standing Unsupported with Feet Together  Able to place feet together independently and stand for 1 minute with supervision    From Standing, Reach Forward with Outstretched Arm  Reaches forward but needs supervision    From Standing Position, Pick up Object from Floor  Able to pick up shoe, needs supervision    From Standing Position, Turn to Look Behind Over each Shoulder  Turn sideways only but maintains balance    Turn 360 Degrees  Needs assistance while turning    Standing Unsupported, Alternately Place Feet on Step/Stool  Able to complete >2 steps/needs minimal assist  Standing Unsupported, One Foot in Front  Needs help to step but can hold 15 seconds    Standing on One Leg  Unable to try or needs assist to prevent fall    Total Score  29    Berg comment:  29/56 declined from previous assessment      Prosthetics Assessment - 07/21/19 1031      Prosthetics   Current prosthetic wear tolerance (days/week)   daily    Current prosthetic wear tolerance (#hours/day)   all awake hours    Edema  edema has diminished    Residual limb condition   intact with no issues per patient report                          PT Education - 07/21/19 1307    Education Details  Decline in balance since last visit; areas to continue to address; review of visits as pt has requested to decrease frequency to 1x/week    Person(s) Educated  Patient    Methods  Explanation    Comprehension  Verbalized understanding       PT Short Term Goals - 07/21/19 1309      PT SHORT TERM GOAL #1   Title  Patient verbalizes proper sweat  management of residual limb. (All updated STGs Target Date: 06/18/2019)    Time  4    Period  Weeks    Status  On-going    Target Date  06/18/19      PT SHORT TERM GOAL #2   Title  Berg Balance >36/56    Baseline  declined to 29/56    Time  4    Period  Weeks    Status  Not Met    Target Date  06/18/19      PT  SHORT TERM GOAL #3   Title  Patient ambulates 300' outdoors on paved surfaces with prosthesis & cane with supervision.    Baseline  not formally tested but pt reports ambulating from car into clinic with cane    Time  4    Period  Weeks    Status  Achieved    Target Date  06/18/19      PT SHORT TERM GOAL #4   Title  Patient negotiates ramps & curbs with cane & prosthesis with supervision.    Baseline  min guard    Time  4    Period  Weeks    Status  Partially Met    Target Date  06/18/19      PT SHORT TERM GOAL #5   Title  Patient verbalizes fall prevention strategies.    Time  4    Period  Weeks    Status  On-going    Target Date  06/18/19        PT Long Term Goals - 07/21/19 1553      PT LONG TERM GOAL #1   Title  Patient is independent with all aspects of prosthetic care &  tolerates wear prosthesis daily for greater than 90% of awake hours without skin integrity problems or limb discomfort to enable function throughout his day to enable safe utilization of the prosthesis  (All LTGs Target Date:  08/18/2019)    Time  12    Period  Weeks   visits   Status  On-going    Target Date  08/18/19      PT LONG TERM GOAL #2   Title  Dynamic Gait Index with prosthesis & cane >16/24    Time  12    Period  Weeks   visits   Status  New    Target Date  08/18/19      PT LONG TERM GOAL #3   Title  Berg balance > 37/56 to indicate lower fall risk and less dependency in standing ADLs    Time  12    Period  Weeks   visits   Status  Revised    Target Date  08/18/19      PT LONG TERM GOAL #4   Title  patient ambulates 500 feet with LRAD and prosthesis modified independent to enable community access     Time  12    Period  Weeks   visits   Status  On-going    Target Date  08/18/19      PT LONG TERM GOAL #5   Title  patient negotiates ramps & curbs with LRAD and stairs (alternating sequence) with one rail and the prosthesis modified independent to enable community access     Time  12    Period  Weeks   visits   Status  Revised    Target Date  08/18/19      Additional Long Term Goals   Additional Long Term Goals  Yes      PT LONG TERM GOAL #6   Title  Pt will demonstrate ability to safely kneel down or from standing reach down to floor to pick up items MOD I    Time  12    Period  Weeks    Status  New    Target Date  08/18/19            Plan - 07/21/19 1548    Clinical Impression Statement  Pt returns after prolonged time away from PT due to illness of unknown etiology.  Upon reassessment pt had experienced a decline in his standing balance with a decrease in BERG score to 29/56 indicating continued high risk for falls.  Pt continues to require minimal assistance to perform safe curb and ramp negotiation with cane and continues to utilize step to sequence for stair negotiation.  Pt feels ready to continue with therapy but requested to decrease frequency to 1x/week; feels unable to attend 2x/week due to other appointments.  Goals adjusted; will continue to address in order to progress towards LTG.    Personal Factors and Comorbidities  Comorbidity 3+;Fitness;Time since onset of injury/illness/exacerbation    Comorbidities  R TTA, L THA 10/11/2016, L transmetarsal amp 2017, CAD, CHF, MI, arthritis, DM2, HTN, CKD stage III    Examination-Activity Limitations  Caring for Others;Carry;Locomotion Level;Reach Overhead;Stairs;Stand;Transfers    Examination-Participation Restrictions  Community Activity;Driving;Personal Finances    Stability/Clinical Decision Making  Evolving/Moderate complexity    Rehab Potential  Good    PT Frequency  1x / week   downgraded to 1x/week for remainder of POC   PT Duration  6 weeks   9 weeks   PT Treatment/Interventions  ADLs/Self Care Home Management;DME Instruction;Gait training;Stair training;Functional mobility training;Canalith Repostioning;Therapeutic activities;Therapeutic exercise;Balance training;Neuromuscular  re-education;Patient/family education;Prosthetic Training;Manual techniques;Vestibular    PT Next Visit Plan  Monitor BP and 02.  Goals updated on 12/2.  Pt wants to review how to get down on the floor to get objects out from under bed.  Continue to work on balance - update HEP. Endurance for gait > 500' with cane, obstacles/ramp/curb with cane, alternating sequence for stair negotiation.  Safe technique for picking objects off floor.    PT Home Exercise Plan  Access Code: [email or web address] & Amputee sink HEP    Consulted and Agree with Plan of Care  Patient       Patient will benefit from skilled therapeutic intervention in order to improve the following deficits and impairments:  Abnormal gait, Decreased activity tolerance, Decreased balance, Decreased endurance, Decreased knowledge of use of DME, Decreased mobility, Decreased skin integrity, Decreased strength, Prosthetic Dependency, Postural dysfunction, Pain  Visit Diagnosis: Other abnormalities of gait and mobility  Unsteadiness on feet  Muscle weakness (generalized)  Abnormal posture  Pain in left lower leg  History of falling     Problem List Patient Active Problem List   Diagnosis Date Noted  . Elevated troponin 12/22/2018  . Acute exacerbation of CHF (congestive heart failure) ([place]) 12/21/2018  . Gait abnormality 09/11/2018  . CAD S/P percutaneous coronary angioplasty 08/06/2018  . Cardiomyopathy ([place]) 08/06/2018  . PVD (peripheral vascular disease) ([place]) 08/06/2018  . Shortness of breath   . Acute on chronic combined systolic and diastolic CHF, NYHA class 2 ([place])   . History of NSTEMI 07/28/2018  . Respiratory failure with hypoxia ([place]) 07/28/2018  . Acute heart failure ([place]) 07/28/2018  . Hypertensive emergency 07/28/2018  . S/P BKA (below knee amputation) unilateral, right ([place]) 07/15/2018  . Hypoglycemia   . Anxiety about health   . Labile blood glucose   . Essential hypertension   . History of transmetatarsal  amputation of left foot ([place])   . Hypoalbuminemia due to protein-calorie malnutrition ([place])   . Stage 3 chronic kidney disease   . Poorly controlled type 2 diabetes mellitus with peripheral neuropathy ([place])   . Acute blood loss anemia   . Anemia of chronic disease   . Leukocytosis   . Tobacco abuse   . Hyperkalemia 06/20/2018  . Diabetic polyneuropathy associated with type 2 diabetes mellitus ([place])   . Severe protein-calorie malnutrition ([place])   . AKI (acute kidney injury) ([place]) 06/17/2018  . Normocytic anemia 06/17/2018  . DM2 (diabetes mellitus, type 2) ([place]) 05/14/2018  . Viral pharyngitis 01/14/2017  . Cigarette smoker 01/14/2017  . Avascular necrosis of hip, left ([place]) 10/11/2016  . Status post left hip replacement 10/11/2016  . S/P transmetatarsal amputation of foot, left ([place]) 07/25/2016  . Balance problem 07/25/2016  . Fournier's gangrene 08/16/2011  . Acute respiratory failure ([place]) 08/16/2011  . Septic shock(785.52) 08/16/2011  . Insulin dependent diabetes mellitus with complications ([place]) 75/30/0511    Rico Junker, PT, DPT 07/21/19    3:57 PM    [facility] [address] [place], [place], [address] Phone: [phone]   Fax:  [phone]  Name: ANIKET PAYE MRN: [id number] Date of Birth: Jun 07, 1971

## 2019-07-23 ENCOUNTER — Ambulatory Visit: Payer: Medicare Other | Admitting: Physical Therapy

## 2019-07-26 ENCOUNTER — Encounter: Payer: Medicare Other | Admitting: Physical Therapy

## 2019-07-27 ENCOUNTER — Encounter: Payer: Medicare Other | Admitting: Physical Therapy

## 2019-07-28 ENCOUNTER — Encounter: Payer: Medicare Other | Admitting: Physical Therapy

## 2019-07-29 ENCOUNTER — Ambulatory Visit: Payer: Medicare Other | Admitting: Physical Therapy

## 2019-08-02 ENCOUNTER — Encounter: Payer: Medicare Other | Admitting: Physical Therapy

## 2019-08-02 NOTE — Progress Notes (Deleted)
HPI: Follow-up coronary artery disease. Patient admitted December 2019 with CHF and non-ST elevation myocardial infarction. Cardiac catheterization revealed an 85% circumflex, 90% mid to distal LAD (distal LAD small vessel and not amenable to PCI) and 75% first marginal. Patient had PCI of his left circumflex with drug-eluting stent.  Echocardiogram May 2020 showed ejection fraction 45 to 50%, moderate diastolic dysfunction, severe left atrial enlargement, moderate mitral regurgitation and mild aortic insufficiency. Renal ultrasound November 2020 showed possible lesion in the right kidney and noncontrast MRI suggested.  Since last seen,   Current Outpatient Medications  Medication Sig Dispense Refill  . acetaminophen (TYLENOL) 325 MG tablet Take 1-2 tablets (325-650 mg total) by mouth every 6 (six) hours as needed for mild pain (pain score 1-3 or temp > 100.5).    [place] [facility] ALPRAZolam (XANAX) 0.25 MG tablet Take 1 tablet (0.25 mg total) by mouth 3 (three) times daily as needed for anxiety. 30 tablet 0  . aspirin EC 81 MG EC tablet Take 1 tablet (81 mg total) by mouth daily. 90 tablet 3  . atorvastatin (LIPITOR) 80 MG tablet Take 1 tablet (80 mg total) by mouth daily. 90 tablet 3  . blood glucose meter kit and supplies Dispense based on patient and insurance preference. Use up to four times daily as directed. (FOR ICD-9 250.00, 250.01). 1 each 0  . DULoxetine (CYMBALTA) 30 MG capsule Take 1 capsule (30 mg total) by mouth daily. 30 capsule 1  . ezetimibe (ZETIA) 10 MG tablet Take 1 tablet (10 mg total) by mouth daily. 90 tablet 3  . furosemide (LASIX) 40 MG tablet Take 2 tablets (80 mg total) by mouth 2 (two) times daily. 270 tablet 3  . hydrALAZINE (APRESOLINE) 50 MG tablet Take 1.5 tablets (75 mg total) by mouth every 8 (eight) hours. 405 tablet 1  . insulin aspart protamine - aspart (NOVOLOG MIX 70/30 FLEXPEN) (70-30) 100 UNIT/ML FlexPen Inject 0.15 mLs (15 Units total) into the skin 2 (two) times  daily with a meal. (Patient taking differently: Inject 15 Units into the skin 2 (two) times daily as needed (high blood sugar). ) 15 mL 11  . Insulin Pen Needle 31G X 5 MM MISC Use daily to inject insulin as instructed 100 each 2  . isosorbide mononitrate (IMDUR) 30 MG 24 hr tablet Take 1 tablet (30 mg total) by mouth daily. 90 tablet 3  . metoprolol succinate (TOPROL-XL) 50 MG 24 hr tablet Take 1 tablet (50 mg total) by mouth daily. Take with or immediately following a meal. 90 tablet 3  . Multiple Vitamin (MULTIVITAMIN WITH MINERALS) TABS tablet Take 1 tablet by mouth daily. 90 tablet 3  . sitaGLIPtin-metformin (JANUMET) 50-1000 MG tablet Take 1 tablet by mouth daily.    . ticagrelor (BRILINTA) 90 MG TABS tablet Take 90 mg by mouth 2 (two) times daily.     No current facility-administered medications for this visit.     Past Medical History:  Diagnosis Date  . Anxiety    self reported  . Arthritis   . CHF (congestive heart failure) ([place])   . Coronary artery disease   . Depression    self reported  . Diabetes mellitus   . Hypertension     Past Surgical History:  Procedure Laterality Date  . AMPUTATION Left 05/10/2016   Procedure: Left Transmetatarsal Amputation;  Surgeon: [name], MD;  Location: [facility];  Service: Orthopedics;  Laterality: Left;  . AMPUTATION Right 05/16/2018  Procedure: PARTIAL RAY AMPUTATION FIFTH TOE RIGHT FOOT;  Surgeon: [name], DPM;  Location: [facility];  Service: Podiatry;  Laterality: Right;  . AMPUTATION Right 06/17/2018   Procedure: AMPUTATION 4th RAY Right Foot;  Surgeon: Trula Slade, DPM;  Location: [facility];  Service: Podiatry;  Laterality: Right;  . AMPUTATION Right 06/19/2018   Procedure: RIGHT BELOW KNEE AMPUTATION;  Surgeon: [name], MD;  Location: [facility];  Service: Orthopedics;  Laterality: Right;  . APPLICATION OF WOUND VAC Right 06/19/2018   Procedure: APPLICATION OF WOUND VAC;  Surgeon: [name], MD;  Location: [facility];   Service: Orthopedics;  Laterality: Right;  . CARDIAC CATHETERIZATION  07/29/2018  . CIRCUMCISION  09/02/2011   Procedure: CIRCUMCISION ADULT;  Surgeon: Molli Hazard, MD;  Location: [facility];  Service: Urology;  Laterality: N/A;  . CORONARY STENT INTERVENTION N/A 07/29/2018   Procedure: CORONARY STENT INTERVENTION;  Surgeon: Leonie Man, MD;  Location: [facility];  Service: Cardiovascular;  Laterality: N/A;  . I&D EXTREMITY  09/02/2011   Procedure: IRRIGATION AND DEBRIDEMENT EXTREMITY;  Surgeon: Theodoro Kos, DO;  Location: [facility];  Service: Plastics;  Laterality: N/A;  . INCISION AND DRAINAGE OF WOUND  08/12/2011   Procedure: IRRIGATION AND DEBRIDEMENT WOUND;  Surgeon: Zenovia Jarred, MD;  Location: [facility];  Service: General;;  perineum  . IRRIGATION AND DEBRIDEMENT ABSCESS  08/12/2011   Procedure: IRRIGATION AND DEBRIDEMENT ABSCESS;  Surgeon: Molli Hazard, MD;  Location: [facility];  Service: Urology;  Laterality: N/A;  irrigation and debridment perineum    . IRRIGATION AND DEBRIDEMENT ABSCESS  08/14/2011   Procedure: MINOR INCISION AND DRAINAGE OF ABSCESS;  Surgeon: Molli Hazard, MD;  Location: [facility];  Service: Urology;  Laterality: N/A;  Perineal Wound Debridement;Placement Bilateral Testicular Thigh Pouches  . LEFT HEART CATH AND CORONARY ANGIOGRAPHY N/A 07/29/2018   Procedure: LEFT HEART CATH AND CORONARY ANGIOGRAPHY;  Surgeon: Leonie Man, MD;  Location: [facility];  Service: Cardiovascular;  Laterality: N/A;  . TOTAL HIP ARTHROPLASTY Left 10/11/2016   Procedure: LEFT TOTAL HIP ARTHROPLASTY ANTERIOR APPROACH;  Surgeon: Mcarthur Rossetti, MD;  Location[facility];  Service: Orthopedics;  Laterality: Left;    Social History   Socioeconomic History  . Marital status: Widowed    Spouse name: Not on file  . Number of children: Not on file  . Years of education: Not on file  . Highest education level: Not  on file  Occupational History  . Occupation: Disabled  Tobacco Use  . Smoking status: Current Every Day Smoker    Types: Cigarettes  . Smokeless tobacco: Never Used  Substance and Sexual Activity  . Alcohol use: Yes    Alcohol/week: 0.0 standard drinks    Comment: has not had alcohol in a month  . Drug use: No  . Sexual activity: Not on file  Other Topics Concern  . Not on file  Social History Narrative   Lives w/ his 2 sons.   Social Determinants of Health   Financial Resource Strain:   . Difficulty of Paying Living Expenses: Not on file  Food Insecurity:   . Worried About Charity fundraiser in the Last Year: Not on file  . Ran Out of Food in the Last Year: Not on file  Transportation Needs:   . Lack of Transportation (Medical): Not on file  . Lack of Transportation (Non-Medical): Not on file  Physical Activity:   . Days of Exercise per Week: Not on file  . Minutes of Exercise per Session: Not on file  Stress:   . Feeling of Stress : Not on file  Social Connections:   . Frequency of Communication with Friends and Family: Not on file  . Frequency of Social Gatherings with Friends and Family: Not on file  . Attends Religious Services: Not on file  . Active Member of Clubs or Organizations: Not on file  . Attends Archivist Meetings: Not on file  . Marital Status: Not on file  Intimate Partner Violence:   . Fear of Current or Ex-Partner: Not on file  . Emotionally Abused: Not on file  . Physically Abused: Not on file  . Sexually Abused: Not on file    Family History  Problem Relation Age of Onset  . Diabetes Other     ROS: no fevers or chills, productive cough, hemoptysis, dysphasia, odynophagia, melena, hematochezia, dysuria, hematuria, rash, seizure activity, orthopnea, PND, pedal edema, claudication. Remaining systems are negative.  Physical Exam: Well-developed well-nourished in no acute distress.  Skin is warm and dry.  HEENT is normal.  Neck is  supple.  Chest is clear to auscultation with normal expansion.  Cardiovascular exam is regular rate and rhythm.  Abdominal exam nontender or distended. No masses palpated. Extremities show no edema. neuro grossly intact  ECG- personally reviewed  A/P  1 chronic combined systolic/diastolic congestive heart failure-patient's volume status appears to be reasonable at present.  Continue present dose of Lasix.  Continue fluid restriction and low-sodium diet.  Check potassium and renal function.  2 coronary artery disease-plan to continue aspirin and statin.  Discontinue Brilinta.  3 hyperlipidemia-continue present medical regimen.  4 hypertension-patient's blood pressure is controlled.  Continue present medical regimen.  5 cardiomyopathy-continue hydralazine and nitrates.  ARB discontinued previously because of renal insufficiency.  Continue beta-blocker.  6 history of moderate mitral regurgitation-plan follow-up echocardiogram May 2021.  7 peripheral vascular disease-continue aspirin and statin.  8 tobacco abuse-patient has been counseled on discontinuing.  9 chronic stage IV kidney disease-needs to be followed by nephrology.  Kirk Ruths, MD

## 2019-08-03 ENCOUNTER — Encounter: Payer: Medicare Other | Admitting: Physical Therapy

## 2019-08-04 ENCOUNTER — Encounter: Payer: Medicare Other | Admitting: Physical Therapy

## 2019-08-05 ENCOUNTER — Ambulatory Visit: Payer: Medicaid Other | Admitting: Cardiology

## 2019-08-06 ENCOUNTER — Ambulatory Visit: Payer: Medicare Other | Admitting: Physical Therapy

## 2019-08-09 ENCOUNTER — Encounter: Payer: Medicare Other | Admitting: Physical Therapy

## 2019-08-09 ENCOUNTER — Ambulatory Visit: Payer: Medicare Other | Admitting: Physical Therapy

## 2019-08-10 ENCOUNTER — Encounter: Payer: Medicare Other | Admitting: Physical Therapy

## 2019-08-11 ENCOUNTER — Encounter: Payer: Medicare Other | Admitting: Physical Therapy

## 2019-08-17 ENCOUNTER — Ambulatory Visit: Payer: Medicare Other | Admitting: Physical Therapy

## 2019-08-18 ENCOUNTER — Encounter: Payer: Medicare Other | Admitting: Physical Therapy

## 2019-08-18 ENCOUNTER — Ambulatory Visit: Payer: Medicare Other | Admitting: Physical Therapy

## 2019-08-23 ENCOUNTER — Telehealth: Payer: Self-pay | Admitting: *Deleted

## 2019-09-10 MED FILL — FUROSEMIDE 40 MG TAB: 40 | 30 days supply | Qty: 120 | Fill #1

## 2019-09-13 ENCOUNTER — Encounter: Payer: Self-pay | Admitting: *Deleted

## 2019-09-13 DIAGNOSIS — Z006 Encounter for examination for normal comparison and control in clinical research program: Secondary | ICD-10-CM

## 2019-09-13 NOTE — Telephone Encounter (Signed)
Spoke with patient see research note

## 2019-09-13 NOTE — Research (Signed)
AEGIS V11  Spoke with patient over phone due to COVID-19 pandemic. He says he has been quartening himself just because he didn't feel well, fighting a cold. I asked if he went and got tested he said no.  He says he is going to see his PCP on the first. Thanked him for participating in the study. I asked about his meds and he said he ran out of his ASA and hasn't been taking his Atorvastatin, he thinks his kidney dr took him off of his statin due to his kidney function. I told him to please check on this with cardiologist and kidney doctor. I explained to him his cholesterol medicine is important.                                     "CONSENT"   YES     NO   Continuing further Investigational Product and study visits for follow-up? [x]  []   Continuing consent from future biomedical research [x]  []                                    "EVENTS"    YES     NO  AE   (IF YES SEE SOURCE) []  [x]   SAE  (IF YES SEE SOURCE) []  [x]   ENDPOINT   (IF YES SEE SOURCE) []  [x]   REVASCULARIZATION  (IF YES SEE SOURCE) []  [x]   AMPUTATION   (IF YES SEE SOURCE) []  [x]   TROPONIN'S  (IF YES SEE SOURCE) []  [x]    Lifestyle Adherence Assessment:   YES NO  Abstinence from smoking/remaining tobacco free  X  Cardiac Diet X   Routine physical activity and/or cardiac rehabilitation  X    Current Outpatient Medications:  .  acetaminophen (TYLENOL) 325 MG tablet, Take 1-2 tablets (325-650 mg total) by mouth every 6 (six) hours as needed for mild pain (pain score 1-3 or temp > 100.5)., Disp: , Rfl:  .  ALPRAZolam (XANAX) 0.25 MG tablet, Take 1 tablet (0.25 mg total) by mouth 3 (three) times daily as needed for anxiety., Disp: 30 tablet, Rfl: 0 .  blood glucose meter kit and supplies, Dispense based on patient and insurance preference. Use up to four times daily as directed. (FOR ICD-9 250.00, 250.01)., Disp: 1 each, Rfl: 0 .  DULoxetine (CYMBALTA) 30 MG capsule, Take 1 capsule (30 mg total) by mouth daily., Disp: 30 capsule,  Rfl: 1 .  furosemide (LASIX) 40 MG tablet, Take 2 tablets (80 mg total) by mouth 2 (two) times daily., Disp: 270 tablet, Rfl: 3 .  hydrALAZINE (APRESOLINE) 50 MG tablet, Take 1.5 tablets (75 mg total) by mouth every 8 (eight) hours., Disp: 405 tablet, Rfl: 1 .  insulin aspart protamine - aspart (NOVOLOG MIX 70/30 FLEXPEN) (70-30) 100 UNIT/ML FlexPen, Inject 0.15 mLs (15 Units total) into the skin 2 (two) times daily with a meal. (Patient taking differently: Inject 15 Units into the skin 2 (two) times daily as needed (high blood sugar). ), Disp: 15 mL, Rfl: 11 .  Insulin Pen Needle 31G X 5 MM MISC, Use daily to inject insulin as instructed, Disp: 100 each, Rfl: 2 .  isosorbide mononitrate (IMDUR) 30 MG 24 hr tablet, Take 1 tablet (30 mg total) by mouth daily., Disp: 90 tablet, Rfl: 3 .  metoprolol succinate (TOPROL-XL) 50 MG 24 hr tablet, Take  1 tablet (50 mg total) by mouth daily. Take with or immediately following a meal., Disp: 90 tablet, Rfl: 3 .  Multiple Vitamin (MULTIVITAMIN WITH MINERALS) TABS tablet, Take 1 tablet by mouth daily., Disp: 90 tablet, Rfl: 3 .  sitaGLIPtin-metformin (JANUMET) 50-1000 MG tablet, Take 1 tablet by mouth daily., Disp: , Rfl:  .  ticagrelor (BRILINTA) 90 MG TABS tablet, Take 90 mg by mouth 2 (two) times daily., Disp: , Rfl:  .  aspirin EC 81 MG EC tablet, Take 1 tablet (81 mg total) by mouth daily. (Patient not taking: Reported on 09/13/2019), Disp: 90 tablet, Rfl: 3 .  atorvastatin (LIPITOR) 80 MG tablet, Take 1 tablet (80 mg total) by mouth daily. (Patient not taking: Reported on 09/13/2019), Disp: 90 tablet, Rfl: 3 .  ezetimibe (ZETIA) 10 MG tablet, Take 1 tablet (10 mg total) by mouth daily., Disp: 90 tablet, Rfl: 3

## 2019-09-30 IMAGING — CR DG CHEST 2V
2 series · 2 of 2 positions shown · non-contrast
Comparison: 08/04/2018

CLINICAL DATA: Chest tightness, labored breathing, and cough for 2
days. History of hypertension, heart disease, smoker.

EXAM:
CHEST - 2 VIEW

[chest lat]
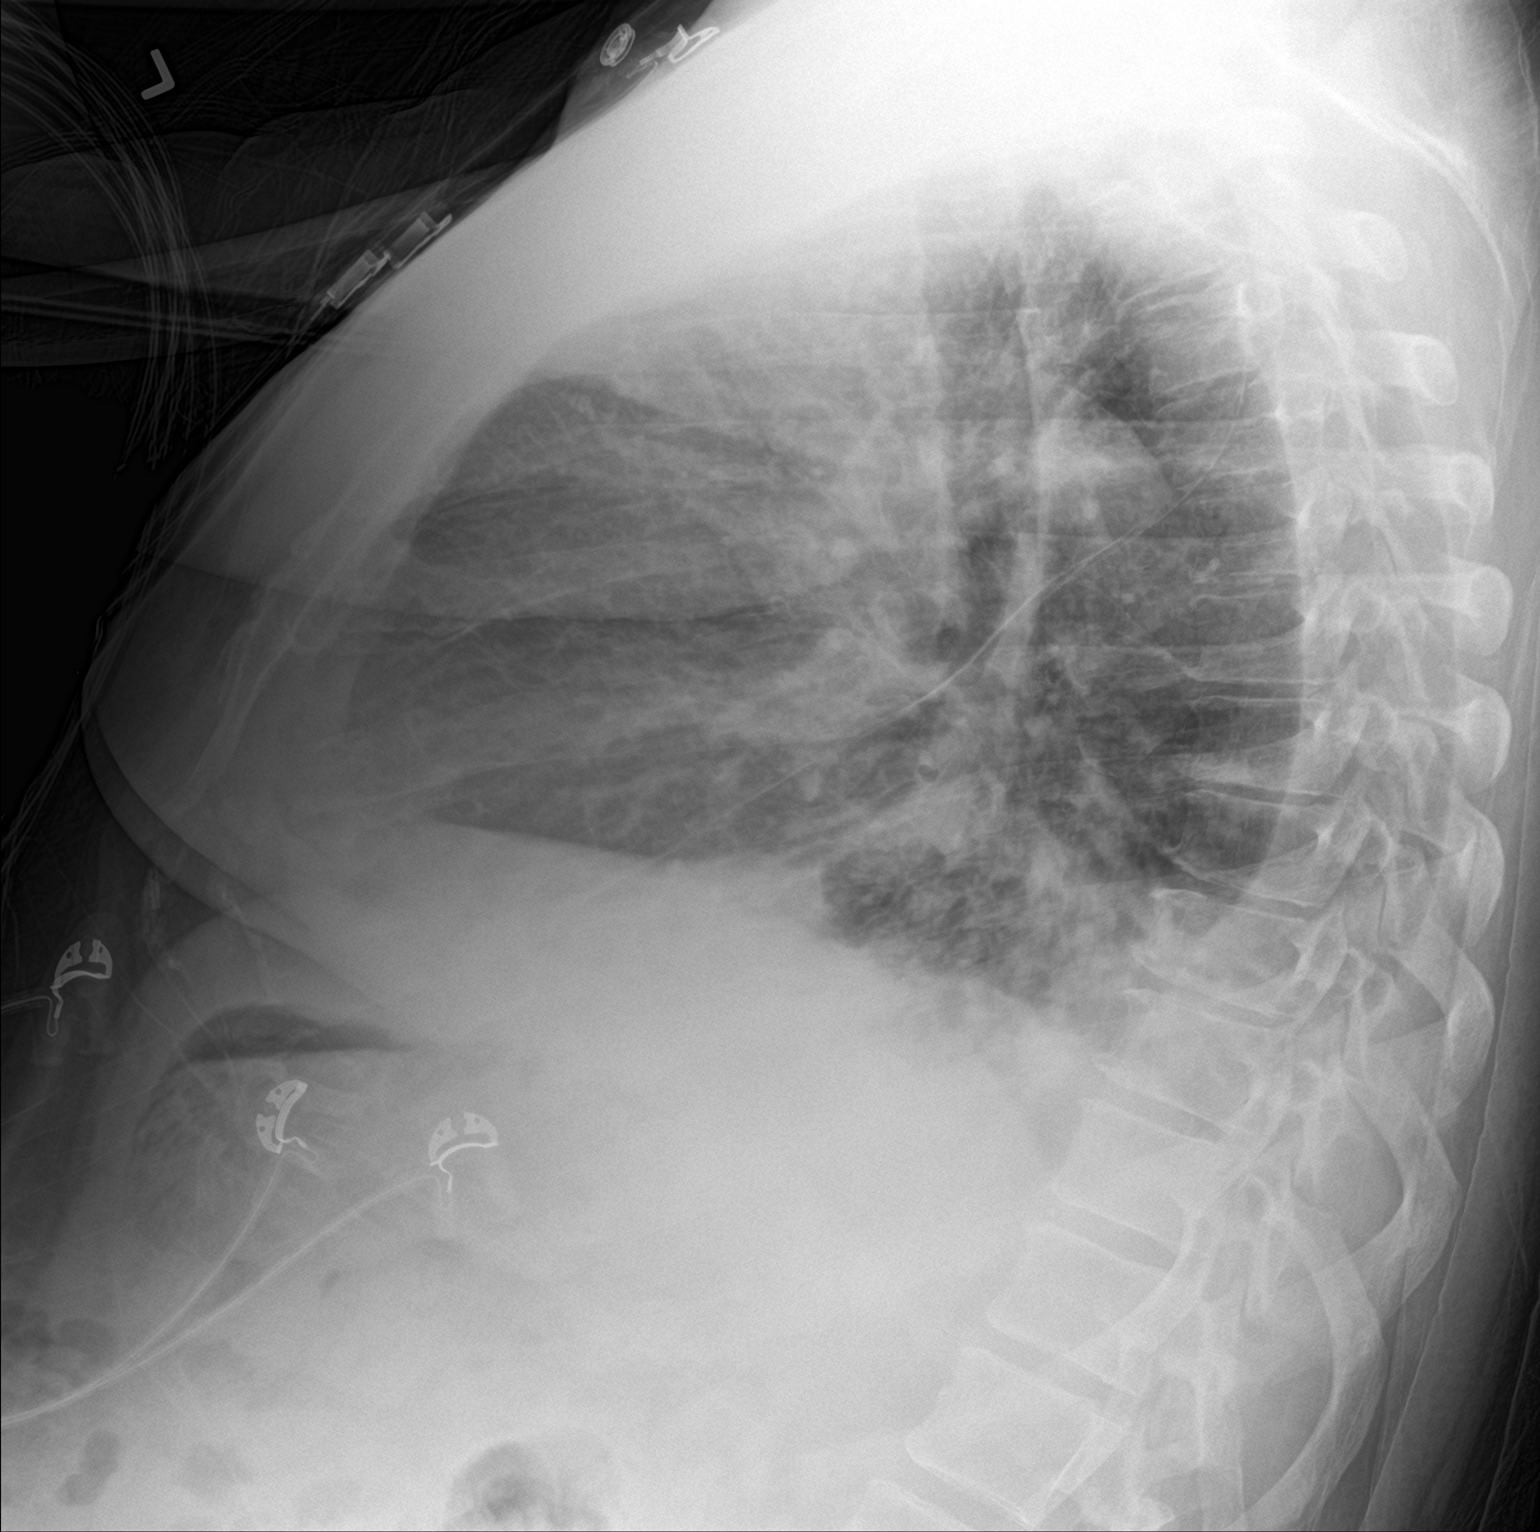

[chest ap]
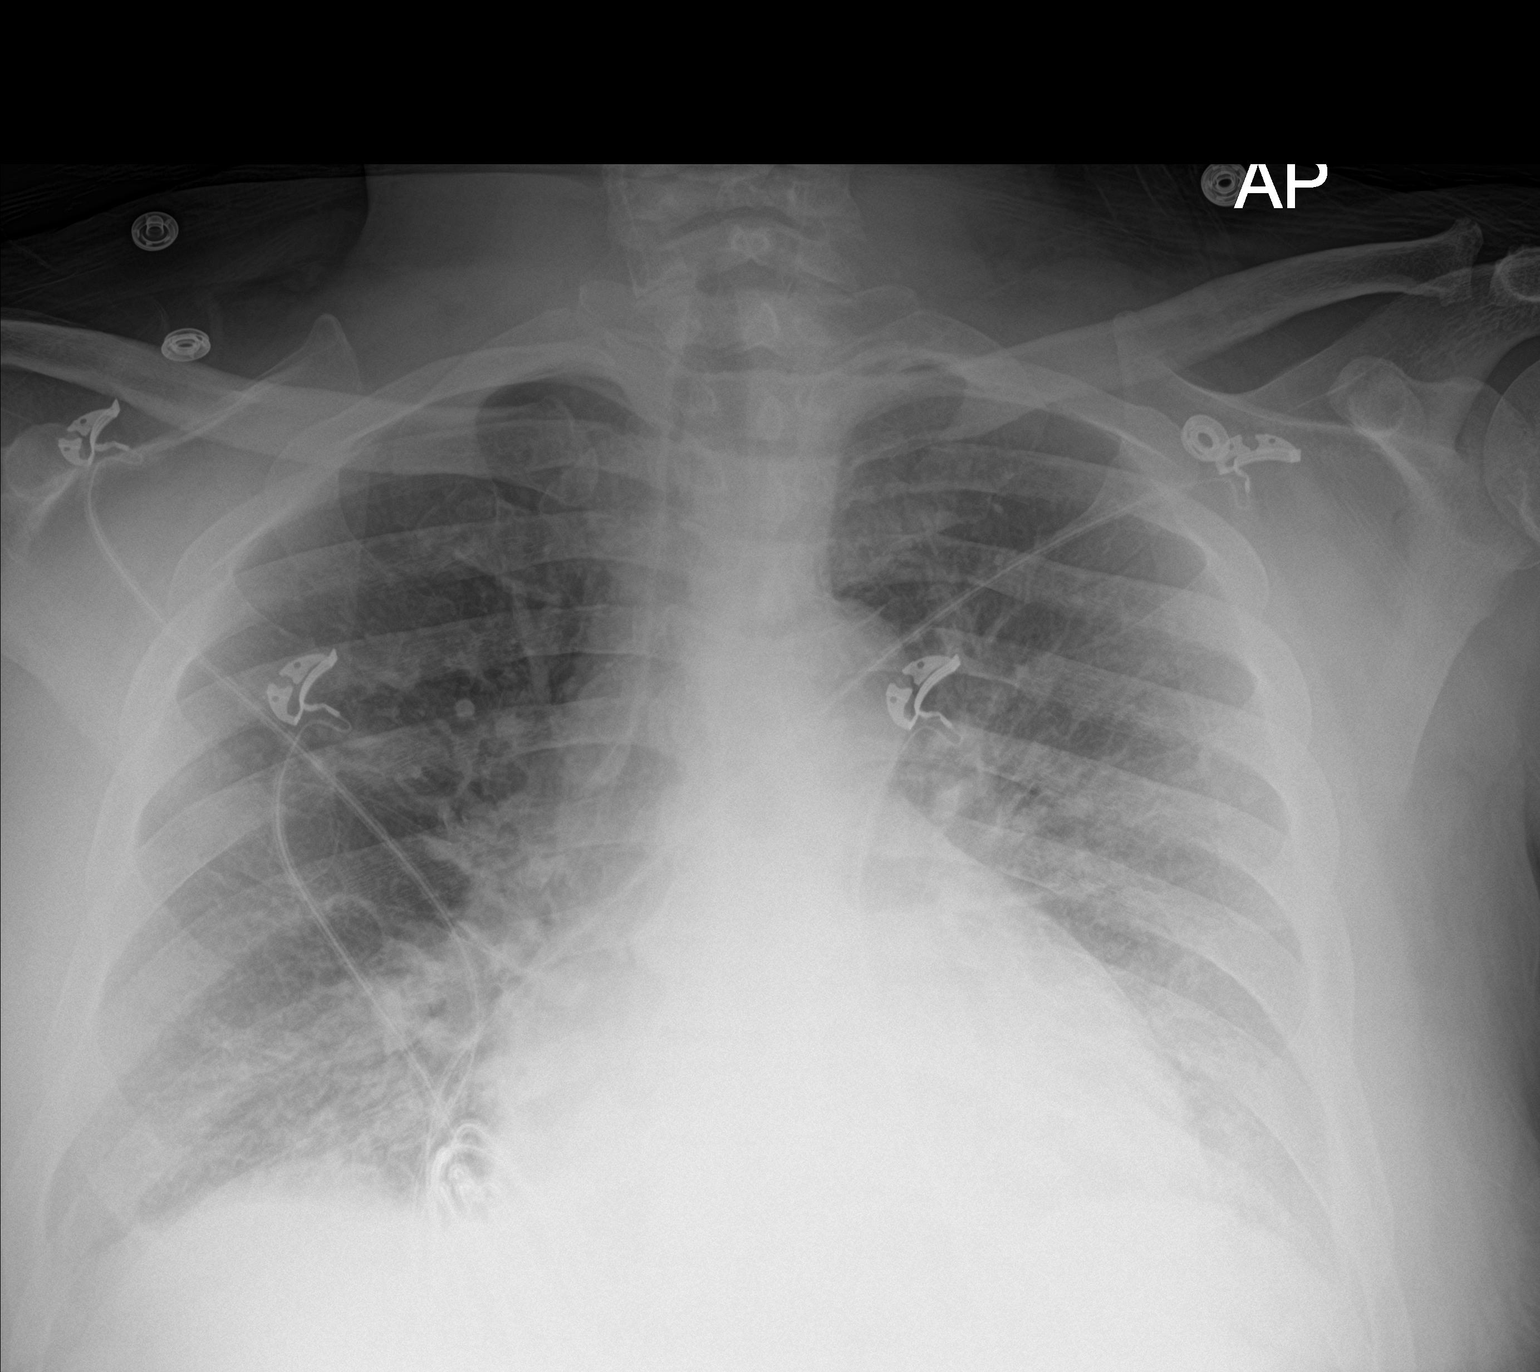

[2 of 2 positions shown; findings below may reference images not displayed]

FINDINGS: Cardiac enlargement. Perihilar and basilar airspace disease likely
representing edema. Mild progression since previous study. Small
bilateral pleural effusions. No pneumothorax. Mediastinal contours
appear intact.
IMPRESSION: Cardiac enlargement with perihilar and basilar edema and small
bilateral pleural effusions. Mild progression since previous study.

## 2019-10-05 ENCOUNTER — Telehealth: Payer: Self-pay | Admitting: *Deleted

## 2019-10-05 NOTE — Telephone Encounter (Signed)
A message was left, re: his follow up visit. 

## 2019-10-28 ENCOUNTER — Encounter: Payer: Self-pay | Admitting: Orthopedic Surgery

## 2019-10-28 ENCOUNTER — Ambulatory Visit (INDEPENDENT_AMBULATORY_CARE_PROVIDER_SITE_OTHER): Payer: Medicare Other | Admitting: Orthopedic Surgery

## 2019-10-28 ENCOUNTER — Other Ambulatory Visit: Payer: Self-pay

## 2019-10-28 VITALS — Ht 72.0 in | Wt 245.0 lb

## 2019-10-28 DIAGNOSIS — I872 Venous insufficiency (chronic) (peripheral): Secondary | ICD-10-CM | POA: Diagnosis not present

## 2019-10-28 DIAGNOSIS — E11622 Type 2 diabetes mellitus with other skin ulcer: Secondary | ICD-10-CM

## 2019-10-28 DIAGNOSIS — I878 Other specified disorders of veins: Secondary | ICD-10-CM

## 2019-10-28 NOTE — Progress Notes (Signed)
Office Visit Note   Patient: Edward [name]           Date of Birth: [birth year]           MRN: [id number] Visit Date: 10/28/2019              Requested by: [name], [address] [place],  [place] [address] PCP: [name], MD  Chief Complaint  Patient presents with  . Left Leg - Open Wound      HPI: This is a pleasant 49 year old gentleman he is status post right below-knee amputation and left transmetatarsal amputation.  He is a diabetic with a reported hemoglobin A1c of 13 he states she has had developing ulcers on his lower leg.  He denies any trauma.  He also has had significant swelling in his left lower extremity.  He was wearing compression socks but is currently not doing this  Assessment & Plan: Visit Diagnoses: No diagnosis found.  Plan: He will go into [place] wrapping today.  Follow-up in 1 week.  Hope is to decrease swelling enough so he can go into a pair of compression socks.  Follow-Up Instructions: No follow-ups on file.   Ortho Exam  Patient is alert, oriented, no adenopathy, well-dressed, normal affect, normal respiratory effort. Left lower extremity he has 2 grade 1 Wagner ulcers on the lateral side of his calf.  He has significant edema.  No cellulitis no fluctuance distal TMA stump looks good well-healed incision no ulcers or calluses  Imaging: No results found. No images are attached to the encounter.  Labs: Lab Results  Component Value Date   HGBA1C 7.5 (H) 12/22/2018   HGBA1C 6.7 (H) 07/29/2018   HGBA1C 15.9 (H) 05/14/2018   ESRSEDRATE 128 (H) 06/15/2018   ESRSEDRATE 47 (H) 05/07/2016   CRP 215.2 (H) 06/15/2018   CRP 22.6 (H) 05/07/2016   REPTSTATUS 06/22/2018 FINAL 06/17/2018   GRAMSTAIN  06/17/2018    RARE WBC PRESENT, PREDOMINANTLY PMN FEW GRAM POSITIVE COCCI    CULT  06/17/2018    MODERATE VIRIDANS STREPTOCOCCUS FEW ENTEROCOCCUS FAECALIS MODERATE PREVOTELLA SPECIES BETA LACTAMASE POSITIVE    LABORGA ENTEROCOCCUS  FAECALIS 06/17/2018     Lab Results  Component Value Date   ALBUMIN 3.7 (L) 04/30/2019   ALBUMIN 3.4 (L) 12/21/2018   ALBUMIN 3.8 (L) 10/16/2018    Lab Results  Component Value Date   MG 1.8 07/28/2018   MG 2.0 08/14/2011   MG 1.6 08/13/2011   No results found for: VD25OH  No results found for: PREALBUMIN CBC EXTENDED Latest Ref Rng & Units 12/23/2018 12/21/2018 09/24/2018  WBC 4.0 - 10.5 K/uL 5.7 5.8 6.7  RBC 4.22 - 5.81 MIL/uL 4.69 4.38 4.59  HGB 13.0 - 17.0 g/dL 12.8(L) 12.0(L) 12.7(L)  HCT 39.0 - 52.0 % 38.9(L) 38.1(L) 39.7  PLT 150 - 400 K/uL 248 230 233  NEUTROABS 1.7 - 7.7 K/uL 3.0 - -  LYMPHSABS 0.7 - 4.0 K/uL 1.9 - -     Body mass index is 33.23 kg/m.  Orders:  No orders of the defined types were placed in this encounter.  No orders of the defined types were placed in this encounter.    Procedures: No procedures performed  Clinical Data: No additional findings.  ROS:  All other systems negative, except as noted in the HPI. Review of Systems  Objective: Vital Signs: Ht 6' (1.829 m)   Wt 245 lb (111.1 kg)   BMI 33.23 kg/m  Specialty Comments:  No specialty comments available.  PMFS History: Patient Active Problem List   Diagnosis Date Noted  . Elevated troponin 12/22/2018  . Acute exacerbation of CHF (congestive heart failure) ([place]) 12/21/2018  . Gait abnormality 09/11/2018  . CAD S/P percutaneous coronary angioplasty 08/06/2018  . Cardiomyopathy ([place]) 08/06/2018  . PVD (peripheral vascular disease) ([place]) 08/06/2018  . Shortness of breath   . Acute on chronic combined systolic and diastolic CHF, NYHA class 2 ([place])   . History of NSTEMI 07/28/2018  . Respiratory failure with hypoxia ([place]) 07/28/2018  . Acute heart failure ([place]) 07/28/2018  . Hypertensive emergency 07/28/2018  . S/P BKA (below knee amputation) unilateral, right ([place]) 07/15/2018  . Hypoglycemia   . Anxiety about health   . Labile blood glucose   . Essential hypertension     . History of transmetatarsal amputation of left foot ([place])   . Hypoalbuminemia due to protein-calorie malnutrition ([place])   . Stage 3 chronic kidney disease   . Poorly controlled type 2 diabetes mellitus with peripheral neuropathy ([place])   . Acute blood loss anemia   . Anemia of chronic disease   . Leukocytosis   . Tobacco abuse   . Hyperkalemia 06/20/2018  . Diabetic polyneuropathy associated with type 2 diabetes mellitus ([place])   . Severe protein-calorie malnutrition ([place])   . AKI (acute kidney injury) ([place]) 06/17/2018  . Normocytic anemia 06/17/2018  . DM2 (diabetes mellitus, type 2) ([place]) 05/14/2018  . Viral pharyngitis 01/14/2017  . Cigarette smoker 01/14/2017  . Avascular necrosis of hip, left ([place]) 10/11/2016  . Status post left hip replacement 10/11/2016  . S/P transmetatarsal amputation of foot, left ([place]) 07/25/2016  . Balance problem 07/25/2016  . Fournier's gangrene 08/16/2011  . Acute respiratory failure ([place]) 08/16/2011  . Septic shock(785.52) 08/16/2011  . Insulin dependent diabetes mellitus with complications ([place]) 59/93/5701   Past Medical History:  Diagnosis Date  . Anxiety    self reported  . Arthritis   . CHF (congestive heart failure) ([place])   . Coronary artery disease   . Depression    self reported  . Diabetes mellitus   . Hypertension     Family History  Problem Relation Age of Onset  . Diabetes Other     Past Surgical History:  Procedure Laterality Date  . AMPUTATION Left 05/10/2016   Procedure: Left Transmetatarsal Amputation;  Surgeon: [name], MD;  Location: [facility];  Service: Orthopedics;  Laterality: Left;  . AMPUTATION Right 05/16/2018   Procedure: PARTIAL RAY AMPUTATION FIFTH TOE RIGHT FOOT;  Surgeon: [name], DPM;  Location: [facility];  Service: Podiatry;  Laterality: Right;  . AMPUTATION Right 06/17/2018   Procedure: AMPUTATION 4th RAY Right Foot;  Surgeon: Trula Slade, DPM;  Location: [facility];  Service: Podiatry;  Laterality:  Right;  . AMPUTATION Right 06/19/2018   Procedure: RIGHT BELOW KNEE AMPUTATION;  Surgeon: [name], MD;  Location: [facility];  Service: Orthopedics;  Laterality: Right;  . APPLICATION OF WOUND VAC Right 06/19/2018   Procedure: APPLICATION OF WOUND VAC;  Surgeon: [name], MD;  Location: [facility];  Service: Orthopedics;  Laterality: Right;  . CARDIAC CATHETERIZATION  07/29/2018  . CIRCUMCISION  09/02/2011   Procedure: CIRCUMCISION ADULT;  Surgeon: Molli Hazard, MD;  Location: [facility];  Service: Urology;  Laterality: N/A;  . CORONARY STENT INTERVENTION N/A 07/29/2018   Procedure: CORONARY STENT INTERVENTION;  Surgeon: Leonie Man, MD;  Location: [facility]  INVASIVE CV LAB;  Service: Cardiovascular;  Laterality: N/A;  . I & D EXTREMITY  09/02/2011   Procedure: IRRIGATION AND DEBRIDEMENT EXTREMITY;  Surgeon: Theodoro Kos, DO;  Location: [facility];  Service: Plastics;  Laterality: N/A;  . INCISION AND DRAINAGE OF WOUND  08/12/2011   Procedure: IRRIGATION AND DEBRIDEMENT WOUND;  Surgeon: Zenovia Jarred, MD;  Location: [facility];  Service: General;;  perineum  . IRRIGATION AND DEBRIDEMENT ABSCESS  08/12/2011   Procedure: IRRIGATION AND DEBRIDEMENT ABSCESS;  Surgeon: Molli Hazard, MD;  Location: [facility];  Service: Urology;  Laterality: N/A;  irrigation and debridment perineum    . IRRIGATION AND DEBRIDEMENT ABSCESS  08/14/2011   Procedure: MINOR INCISION AND DRAINAGE OF ABSCESS;  Surgeon: Molli Hazard, MD;  Location: [facility];  Service: Urology;  Laterality: N/A;  Perineal Wound Debridement;Placement Bilateral Testicular Thigh Pouches  . LEFT HEART CATH AND CORONARY ANGIOGRAPHY N/A 07/29/2018   Procedure: LEFT HEART CATH AND CORONARY ANGIOGRAPHY;  Surgeon: Leonie Man, MD;  Location: [facility];  Service: Cardiovascular;  Laterality: N/A;  . TOTAL HIP ARTHROPLASTY Left 10/11/2016   Procedure: LEFT TOTAL HIP ARTHROPLASTY ANTERIOR APPROACH;   Surgeon: Mcarthur Rossetti, MD;  Location: [facility];  Service: Orthopedics;  Laterality: Left;   Social History   Occupational History  . Occupation: Disabled  Tobacco Use  . Smoking status: Current Every Day Smoker    Types: Cigarettes  . Smokeless tobacco: Never Used  Substance and Sexual Activity  . Alcohol use: Yes    Alcohol/week: 0.0 standard drinks    Comment: has not had alcohol in a month  . Drug use: No  . Sexual activity: Not on file

## 2019-11-03 ENCOUNTER — Telehealth: Payer: Medicare Other | Admitting: Cardiology

## 2019-11-04 ENCOUNTER — Encounter: Payer: Self-pay | Admitting: Cardiology

## 2019-11-04 ENCOUNTER — Encounter: Payer: Self-pay | Admitting: Orthopedic Surgery

## 2019-11-04 ENCOUNTER — Ambulatory Visit (INDEPENDENT_AMBULATORY_CARE_PROVIDER_SITE_OTHER): Payer: Medicare Other | Admitting: Physician Assistant

## 2019-11-04 ENCOUNTER — Other Ambulatory Visit: Payer: Self-pay

## 2019-11-04 ENCOUNTER — Telehealth (INDEPENDENT_AMBULATORY_CARE_PROVIDER_SITE_OTHER): Payer: Medicare Other | Admitting: Cardiology

## 2019-11-04 VITALS — BP 154/94 | HR 86 | Ht 72.0 in

## 2019-11-04 VITALS — Ht 72.0 in | Wt 245.0 lb

## 2019-11-04 DIAGNOSIS — I2510 Atherosclerotic heart disease of native coronary artery without angina pectoris: Secondary | ICD-10-CM

## 2019-11-04 DIAGNOSIS — E1122 Type 2 diabetes mellitus with diabetic chronic kidney disease: Secondary | ICD-10-CM

## 2019-11-04 DIAGNOSIS — N1832 Chronic kidney disease, stage 3b: Secondary | ICD-10-CM

## 2019-11-04 DIAGNOSIS — I130 Hypertensive heart and chronic kidney disease with heart failure and stage 1 through stage 4 chronic kidney disease, or unspecified chronic kidney disease: Secondary | ICD-10-CM | POA: Diagnosis not present

## 2019-11-04 DIAGNOSIS — M87052 Idiopathic aseptic necrosis of left femur: Secondary | ICD-10-CM | POA: Diagnosis not present

## 2019-11-04 DIAGNOSIS — Z89511 Acquired absence of right leg below knee: Secondary | ICD-10-CM

## 2019-11-04 DIAGNOSIS — F17210 Nicotine dependence, cigarettes, uncomplicated: Secondary | ICD-10-CM

## 2019-11-04 DIAGNOSIS — N183 Chronic kidney disease, stage 3 unspecified: Secondary | ICD-10-CM

## 2019-11-04 DIAGNOSIS — I5042 Chronic combined systolic (congestive) and diastolic (congestive) heart failure: Secondary | ICD-10-CM

## 2019-11-04 DIAGNOSIS — Z9861 Coronary angioplasty status: Secondary | ICD-10-CM

## 2019-11-04 DIAGNOSIS — I739 Peripheral vascular disease, unspecified: Secondary | ICD-10-CM

## 2019-11-04 DIAGNOSIS — E1151 Type 2 diabetes mellitus with diabetic peripheral angiopathy without gangrene: Secondary | ICD-10-CM

## 2019-11-04 DIAGNOSIS — Z89432 Acquired absence of left foot: Secondary | ICD-10-CM

## 2019-11-04 DIAGNOSIS — Z7982 Long term (current) use of aspirin: Secondary | ICD-10-CM

## 2019-11-04 DIAGNOSIS — I429 Cardiomyopathy, unspecified: Secondary | ICD-10-CM

## 2019-11-04 DIAGNOSIS — I252 Old myocardial infarction: Secondary | ICD-10-CM

## 2019-11-04 DIAGNOSIS — Z794 Long term (current) use of insulin: Secondary | ICD-10-CM

## 2019-11-04 DIAGNOSIS — I428 Other cardiomyopathies: Secondary | ICD-10-CM

## 2019-11-04 DIAGNOSIS — E118 Type 2 diabetes mellitus with unspecified complications: Secondary | ICD-10-CM

## 2019-11-04 NOTE — Patient Instructions (Addendum)
Medication Instructions:  Your physician recommends that you continue on your current medications as directed. Please refer to the Current Medication list given to you today.  *If you need a refill on your cardiac medications before your next appointment, please call your pharmacy*   Follow-Up: At [facility], you and your health needs are our priority.  As part of our continuing mission to provide you with exceptional heart care, we have created designated Provider Care Teams.  These Care Teams include your primary Cardiologist (physician) and Advanced Practice Providers (APPs -  Physician Assistants and Nurse Practitioners) who all work together to provide you with the care you need, when you need it.  We recommend signing up for the patient portal called "MyChart".  Sign up information is provided on this After Visit Summary.  MyChart is used to connect with patients for Virtual Visits (Telemedicine).  Patients are able to view lab/test results, encounter notes, upcoming appointments, etc.  Non-urgent messages can be sent to your provider as well.   To learn more about what you can do with MyChart, go to [email or web address].    Your next appointment:   3-4 months  The format for your next appointment:   In Person  Provider:   Kirk Ruths, MD (ONLY)   Other Instructions Our office will call you to schedule your follow-up appointment.

## 2019-11-04 NOTE — Progress Notes (Signed)
Virtual Visit via Video Note   This visit type was conducted due to national recommendations for restrictions regarding the COVID-19 Pandemic (e.g. social distancing) in an effort to limit this patient's exposure and mitigate transmission in our community.  Due to his co-morbid illnesses, this patient is at least at moderate risk for complications without adequate follow up.  This format is felt to be most appropriate for this patient at this time.  All issues noted in this document were discussed and addressed.  A limited physical exam was performed with this format.  Please refer to the patient's chart for his consent to telehealth for [name].   Due to technical issues on the patient's end this visit was changed to a phone call.  The patient was identified using 2 identifiers.  Date:  11/04/2019   ID:  Edward [name], DOB 1970-12-19, MRN [id number]  Patient Location: Home Provider Location: Home  PCP:  [name], MD  Cardiologist:  Kirk Ruths, MD  Electrophysiologist:  None   Evaluation Performed:  Follow-Up Visit  Chief Complaint:  none  History of Present Illness:    Edward [name] is a 49 y.o. male with IDDM, CRI-3, and PVD who was seen in the [name] December2019 with acute pulmonary edema after he was discharged from rehab s/p Rt BKA.  He has aslo had prior left metatarsal amputation. He ruled in for a NSTEMI.  Echocardiogram revealed an EF of 40 to 45% with moderate LVH and moderate mitral regurgitation. Catheterization done 07/29/2018 revealed an 85% mid circumflex stenosis, 90% distal LAD, severe small first and second diagonal disease and septal perforator disease which was also small. The patient underwent circumflex intervention with DES. He has had some issues with volume overload since.  F/U echo in May 2020 showed his EF to be 45-50% with severe LAE and moderate MR.   He was last contacted for a virtual visit in Sept 2020.  Since then he tells me he has  done well.  He has seen a nephrologist as an OP.  His PCP has been following his labs.  The patient says his SOB has improved.  He denies chest pain.  The patient does not have symptoms concerning for COVID-19 infection (fever, chills, cough, or new shortness of breath).    Past Medical History:  Diagnosis Date  . Anxiety    self reported  . Arthritis   . CHF (congestive heart failure) ([place])   . Coronary artery disease   . Depression    self reported  . Diabetes mellitus   . Hypertension    Past Surgical History:  Procedure Laterality Date  . AMPUTATION Left 05/10/2016   Procedure: Left Transmetatarsal Amputation;  Surgeon: [name], MD;  Location: [place];  Service: Orthopedics;  Laterality: Left;  . AMPUTATION Right 05/16/2018   Procedure: PARTIAL RAY AMPUTATION FIFTH TOE RIGHT FOOT;  Surgeon: [name], DPM;  Location: [place];  Service: Podiatry;  Laterality: Right;  . AMPUTATION Right 06/17/2018   Procedure: AMPUTATION 4th RAY Right Foot;  Surgeon: Trula Slade, DPM;  Location: [facility];  Service: Podiatry;  Laterality: Right;  . AMPUTATION Right 06/19/2018   Procedure: RIGHT BELOW KNEE AMPUTATION;  Surgeon: [name], MD;  Location: [place];  Service: Orthopedics;  Laterality: Right;  . APPLICATION OF WOUND VAC Right 06/19/2018   Procedure: APPLICATION OF WOUND VAC;  Surgeon: [name], MD;  Location: [place];  Service: Orthopedics;  Laterality: Right;  .  CARDIAC CATHETERIZATION  07/29/2018  . CIRCUMCISION  09/02/2011   Procedure: CIRCUMCISION ADULT;  Surgeon: Molli Hazard, MD;  Location: [place];  Service: Urology;  Laterality: N/A;  . CORONARY STENT INTERVENTION N/A 07/29/2018   Procedure: CORONARY STENT INTERVENTION;  Surgeon: Leonie Man, MD;  Location: [place] [facility];  Service: Cardiovascular;  Laterality: N/A;  . I & D EXTREMITY  09/02/2011   Procedure: IRRIGATION AND DEBRIDEMENT EXTREMITY;  Surgeon: Theodoro Kos, DO;   Location: [place];  Service: Plastics;  Laterality: N/A;  . INCISION AND DRAINAGE OF WOUND  08/12/2011   Procedure: IRRIGATION AND DEBRIDEMENT WOUND;  Surgeon: Zenovia Jarred, MD;  Location: [place];  Service: General;;  perineum  . IRRIGATION AND DEBRIDEMENT ABSCESS  08/12/2011   Procedure: IRRIGATION AND DEBRIDEMENT ABSCESS;  Surgeon: Molli Hazard, MD;  Location: [place];  Service: Urology;  Laterality: N/A;  irrigation and debridment perineum    . IRRIGATION AND DEBRIDEMENT ABSCESS  08/14/2011   Procedure: MINOR INCISION AND DRAINAGE OF ABSCESS;  Surgeon: Molli Hazard, MD;  Location: [place];  Service: Urology;  Laterality: N/A;  Perineal Wound Debridement;Placement Bilateral Testicular Thigh Pouches  . LEFT HEART CATH AND CORONARY ANGIOGRAPHY N/A 07/29/2018   Procedure: LEFT HEART CATH AND CORONARY ANGIOGRAPHY;  Surgeon: Leonie Man, MD;  Location: [place] [facility];  Service: Cardiovascular;  Laterality: N/A;  . TOTAL HIP ARTHROPLASTY Left 10/11/2016   Procedure: LEFT TOTAL HIP ARTHROPLASTY ANTERIOR APPROACH;  Surgeon: Mcarthur Rossetti, MD;  [facility];  Service: Orthopedics;  Laterality: Left;     Current Meds  Medication Sig  . acetaminophen (TYLENOL) 325 MG tablet Take 1-2 tablets (325-650 mg total) by mouth every 6 (six) hours as needed for mild pain (pain score 1-3 or temp > 100.5).  Marland Kitchen ALPRAZolam (XANAX) 0.25 MG tablet Take 1 tablet (0.25 mg total) by mouth 3 (three) times daily as needed for anxiety.  Marland Kitchen aspirin EC 81 MG EC tablet Take 1 tablet (81 mg total) by mouth daily.  Marland Kitchen atorvastatin (LIPITOR) 80 MG tablet Take 1 tablet (80 mg total) by mouth daily.  . blood glucose meter kit and supplies Dispense based on patient and insurance preference. Use up to four times daily as directed. (FOR ICD-9 250.00, 250.01).  . DULoxetine (CYMBALTA) 30 MG capsule Take 1 capsule (30 mg total) by mouth daily.  . furosemide (LASIX) 40 MG tablet Take 2  tablets (80 mg total) by mouth 2 (two) times daily.  . hydrALAZINE (APRESOLINE) 50 MG tablet Take 1.5 tablets (75 mg total) by mouth every 8 (eight) hours.  . insulin aspart protamine - aspart (NOVOLOG MIX 70/30 FLEXPEN) (70-30) 100 UNIT/ML FlexPen Inject 0.15 mLs (15 Units total) into the skin 2 (two) times daily with a meal. (Patient taking differently: Inject 15 Units into the skin 2 (two) times daily as needed (high blood sugar). )  . Insulin Pen Needle 31G X 5 MM MISC Use daily to inject insulin as instructed  . isosorbide mononitrate (IMDUR) 30 MG 24 hr tablet Take 1 tablet (30 mg total) by mouth daily.  . metolazone (ZAROXOLYN) 2.5 MG tablet Take 2.5 mg by mouth every morning.  . metoprolol succinate (TOPROL-XL) 50 MG 24 hr tablet Take 1 tablet (50 mg total) by mouth daily. Take with or immediately following a meal.  . Multiple Vitamin (MULTIVITAMIN WITH MINERALS) TABS tablet Take 1 tablet by mouth daily.  . sitaGLIPtin-metformin (JANUMET) 50-1000 MG  tablet Take 1 tablet by mouth daily.  . ticagrelor (BRILINTA) 90 MG TABS tablet Take 90 mg by mouth 2 (two) times daily.     Allergies:   Patient has no known allergies.   Social History   Tobacco Use  . Smoking status: Current Every Day Smoker    Types: Cigarettes  . Smokeless tobacco: Never Used  Substance Use Topics  . Alcohol use: Yes    Alcohol/week: 0.0 standard drinks    Comment: has not had alcohol in a month  . Drug use: No     Family Hx: The patient's family history includes Diabetes in an other family member.  ROS:   Please see the history of present illness.    All other systems reviewed and are negative.   Prior CV studies:   The following studies were reviewed today: [facility] May 2020  Labs/Other Tests and Data Reviewed:    EKG:  An ECG dated 12/22/2018 was personally reviewed today and demonstrated:  NSR-73, 1st AVB, poor anterior RW  Recent Labs: 12/21/2018: B Natriuretic Peptide 631.7 12/23/2018: Hemoglobin  12.8; Platelets 248 04/30/2019: ALT 23; BUN 37; Creatinine, Ser 2.60; NT-Pro BNP 3,766; Potassium 5.0; Sodium 140   Recent Lipid Panel Lab Results  Component Value Date/Time   CHOL 192 04/30/2019 03:07 PM   TRIG 66 04/30/2019 03:07 PM   HDL 42 04/30/2019 03:07 PM   CHOLHDL 4.6 04/30/2019 03:07 PM   CHOLHDL 4.8 07/28/2018 07:21 AM   LDLCALC 138 (H) 04/30/2019 03:07 PM    Wt Readings from Last 3 Encounters:  10/28/19 245 lb (111.1 kg)  04/27/19 245 lb (111.1 kg)  03/18/19 242 lb (109.8 kg)     Objective:    Vital Signs:  BP (!) 154/94   Pulse 86   Ht 6' (1.829 m)   BMI 33.23 kg/m    VITAL SIGNS:  reviewed  ASSESSMENT & PLAN:    Chronic combined systolic and diastolic CHF- He appears compensated on his current regimen of Lasix 80 mg BID and metolazone once a week.  Non-ST elevation (NSTEMI) myocardial infarction [facility]) !09/28/17- Troponin 2.8 (no chest pain)  Stage 3 chronic kidney disease (HCC) SCr 2.6- GFR 32 04/30/2019- he is being followed by a nephrologist.  I will request labs done recently from his PCP  PVD (peripheral vascular disease) ([place]) S/P Rt BKA Nov 2019  Insulin dependent diabetes mellitus with complications ([place]) CRI, PVD  Cardiomyopathy ([place]) Probably mixed secondary to uncontrolled HTN and CAD  Plan: Review recent labs.  I encouraged him to f/u with his nephrologist as scheduled and to sign up for COVID vaccine.   OV in 3-4 months.   COVID-19 Education: The signs and symptoms of COVID-19 were discussed with the patient and how to seek care for testing (follow up with PCP or arrange E-visit).  The importance of social distancing was discussed today.  Time:   Today, I have spent 15 minutes with the patient with telehealth technology discussing the above problems.     Medication Adjustments/Labs and Tests Ordered: Current medicines are reviewed at length with the patient today.  Concerns regarding medicines are outlined above.   Tests  Ordered: No orders of the defined types were placed in this encounter.   Medication Changes: No orders of the defined types were placed in this encounter.   Follow Up:  In Person Dr Stanford Breed (not an APP) in 3-4 months  Signed, Kerin Ransom, Hershal Coria  11/04/2019 8:29 AM    [facility]

## 2019-11-04 NOTE — Progress Notes (Signed)
Office Visit Note   Patient: Edward [name]           Date of Birth: December 16, 1970           MRN: [id number] Visit Date: 11/04/2019              Requested by: [name], [address] [place],  [place] [id number] PCP: [name], MD  Chief Complaint  Patient presents with  . Left Leg - Follow-up    profore dressing       HPI: This is a pleasant 49 year old gentleman who follows up today after having Profore dressing to his left lower extremity for venous stasis disease.  He is also status post left transmetatarsal amputation.  He is doing very well and is tolerated the wraps  Assessment & Plan:  Visit Diagnoses: No diagnosis found.  Plan: He was measured for a size extra-large VIVE compression sock today.  I instructed him on how to apply this allowing for his foot.  He will follow-up in 4 weeks  Follow-Up Instructions: No follow-ups on file.   Ortho Exam  Patient is alert, oriented, no adenopathy, well-dressed, normal affect, normal respiratory effort. Left lower extremity 2 to circumference grade 1 ulcers with no foul smoke smell or minimal odor no surrounding cellulitis swelling significantly decreased with wrinkling status post transmetatarsal amputation completely healed  Imaging: No results found. No images are attached to the encounter.  Labs: Lab Results  Component Value Date   HGBA1C 7.5 (H) 12/22/2018   HGBA1C 6.7 (H) 07/29/2018   HGBA1C 15.9 (H) 05/14/2018   ESRSEDRATE 128 (H) 06/15/2018   ESRSEDRATE 47 (H) 05/07/2016   CRP 215.2 (H) 06/15/2018   CRP 22.6 (H) 05/07/2016   REPTSTATUS 06/22/2018 FINAL 06/17/2018   GRAMSTAIN  06/17/2018    RARE WBC PRESENT, PREDOMINANTLY PMN FEW GRAM POSITIVE COCCI    CULT  06/17/2018    MODERATE VIRIDANS STREPTOCOCCUS FEW ENTEROCOCCUS FAECALIS MODERATE PREVOTELLA SPECIES BETA LACTAMASE POSITIVE    LABORGA ENTEROCOCCUS FAECALIS 06/17/2018     Lab Results  Component Value Date   ALBUMIN 3.7 (L)  04/30/2019   ALBUMIN 3.4 (L) 12/21/2018   ALBUMIN 3.8 (L) 10/16/2018    Lab Results  Component Value Date   MG 1.8 07/28/2018   MG 2.0 08/14/2011   MG 1.6 08/13/2011   No results found for: VD25OH  No results found for: PREALBUMIN CBC EXTENDED Latest Ref Rng & Units 12/23/2018 12/21/2018 09/24/2018  WBC 4.0 - 10.5 K/uL 5.7 5.8 6.7  RBC 4.22 - 5.81 MIL/uL 4.69 4.38 4.59  HGB 13.0 - 17.0 g/dL 12.8(L) 12.0(L) 12.7(L)  HCT 39.0 - 52.0 % 38.9(L) 38.1(L) 39.7  PLT 150 - 400 K/uL 248 230 233  NEUTROABS 1.7 - 7.7 K/uL 3.0 - -  LYMPHSABS 0.7 - 4.0 K/uL 1.9 - -     Body mass index is 33.23 kg/m.  Orders:  No orders of the defined types were placed in this encounter.  No orders of the defined types were placed in this encounter.    Procedures: No procedures performed  Clinical Data: No additional findings.  ROS:  All other systems negative, except as noted in the HPI. Review of Systems  Objective: Vital Signs: Ht 6' (1.829 m)   Wt 245 lb (111.1 kg)   BMI 33.23 kg/m   Specialty Comments:  No specialty comments available.  PMFS History: Patient Active Problem List   Diagnosis Date Noted  . Elevated troponin 12/22/2018  .  Acute exacerbation of CHF (congestive heart failure) ([place]) 12/21/2018  . Gait abnormality 09/11/2018  . CAD S/P percutaneous coronary angioplasty 08/06/2018  . Cardiomyopathy ([place]) 08/06/2018  . PVD (peripheral vascular disease) ([place]) 08/06/2018  . Shortness of breath   . Chronic combined systolic and diastolic CHF (congestive heart failure) ([place])   . History of NSTEMI 07/28/2018  . Respiratory failure with hypoxia ([place]) 07/28/2018  . Acute heart failure ([place]) 07/28/2018  . Hypertensive emergency 07/28/2018  . S/P BKA (below knee amputation) unilateral, right ([place]) 07/15/2018  . Hypoglycemia   . Anxiety about health   . Labile blood glucose   . Essential hypertension   . History of transmetatarsal amputation of left foot ([place])   .  Hypoalbuminemia due to protein-calorie malnutrition ([place])   . Stage 3 chronic kidney disease   . Poorly controlled type 2 diabetes mellitus with peripheral neuropathy ([place])   . Acute blood loss anemia   . Anemia of chronic disease   . Leukocytosis   . Tobacco abuse   . Hyperkalemia 06/20/2018  . Diabetic polyneuropathy associated with type 2 diabetes mellitus ([place])   . Severe protein-calorie malnutrition ([place])   . AKI (acute kidney injury) ([place]) 06/17/2018  . Normocytic anemia 06/17/2018  . Viral pharyngitis 01/14/2017  . Cigarette smoker 01/14/2017  . Avascular necrosis of hip, left ([place]) 10/11/2016  . Status post left hip replacement 10/11/2016  . S/P transmetatarsal amputation of foot, left ([place]) 07/25/2016  . Balance problem 07/25/2016  . Fournier's gangrene 08/16/2011  . Acute respiratory failure ([place]) 08/16/2011  . Septic shock(785.52) 08/16/2011  . Diabetes mellitus with complication, with long-term current use of insulin ([place]) 08/16/2011   Past Medical History:  Diagnosis Date  . Anxiety    self reported  . Arthritis   . CHF (congestive heart failure) ([place])   . Coronary artery disease   . Depression    self reported  . Diabetes mellitus   . Hypertension     Family History  Problem Relation Age of Onset  . Diabetes Other     Past Surgical History:  Procedure Laterality Date  . AMPUTATION Left 05/10/2016   Procedure: Left Transmetatarsal Amputation;  Surgeon: [name], MD;  Location: [place];  Service: Orthopedics;  Laterality: Left;  . AMPUTATION Right 05/16/2018   Procedure: PARTIAL RAY AMPUTATION FIFTH TOE RIGHT FOOT;  Surgeon: [name], DPM;  Location: [place];  Service: Podiatry;  Laterality: Right;  . AMPUTATION Right 06/17/2018   Procedure: AMPUTATION 4th RAY Right Foot;  Surgeon: Trula Slade, DPM;  Location: [place];  Service: Podiatry;  Laterality: Right;  . AMPUTATION Right 06/19/2018   Procedure: RIGHT BELOW KNEE AMPUTATION;  Surgeon: [name], MD;  Location: [place];  Service: Orthopedics;  Laterality: Right;  . APPLICATION OF WOUND VAC Right 06/19/2018   Procedure: APPLICATION OF WOUND VAC;  Surgeon: [name], MD;  Location: [place];  Service: Orthopedics;  Laterality: Right;  . CARDIAC CATHETERIZATION  07/29/2018  . CIRCUMCISION  09/02/2011   Procedure: CIRCUMCISION ADULT;  Surgeon: Molli Hazard, MD;  Location: [place];  Service: Urology;  Laterality: N/A;  . CORONARY STENT INTERVENTION N/A 07/29/2018   Procedure: CORONARY STENT INTERVENTION;  Surgeon: Leonie Man, MD;  Location: [place] [facility];  Service: Cardiovascular;  Laterality: N/A;  . I & D EXTREMITY  09/02/2011   Procedure: IRRIGATION AND DEBRIDEMENT EXTREMITY;  Surgeon: Theodoro Kos, DO;  Location: [facility];  Service: Clinical cytogeneticist;  Laterality: N/A;  . INCISION AND DRAINAGE OF WOUND  08/12/2011   Procedure: IRRIGATION AND DEBRIDEMENT WOUND;  Surgeon: Zenovia Jarred, MD;  Location: [place];  Service: General;;  perineum  . IRRIGATION AND DEBRIDEMENT ABSCESS  08/12/2011   Procedure: IRRIGATION AND DEBRIDEMENT ABSCESS;  Surgeon: Molli Hazard, MD;  Location: [place];  Service: Urology;  Laterality: N/A;  irrigation and debridment perineum    . IRRIGATION AND DEBRIDEMENT ABSCESS  08/14/2011   Procedure: MINOR INCISION AND DRAINAGE OF ABSCESS;  Surgeon: Molli Hazard, MD;  Location: [place];  Service: Urology;  Laterality: N/A;  Perineal Wound Debridement;Placement Bilateral Testicular Thigh Pouches  . LEFT HEART CATH AND CORONARY ANGIOGRAPHY N/A 07/29/2018   Procedure: LEFT HEART CATH AND CORONARY ANGIOGRAPHY;  Surgeon: Leonie Man, MD;  Location: [place] [facility];  Service: Cardiovascular;  Laterality: N/A;  . TOTAL HIP ARTHROPLASTY Left 10/11/2016   Procedure: LEFT TOTAL HIP ARTHROPLASTY ANTERIOR APPROACH;  Surgeon: Mcarthur Rossetti, MD;  [facility];  Service: Orthopedics;  Laterality:  Left;   Social History   Occupational History  . Occupation: Disabled  Tobacco Use  . Smoking status: Current Every Day Smoker    Types: Cigarettes  . Smokeless tobacco: Never Used  Substance and Sexual Activity  . Alcohol use: Yes    Alcohol/week: 0.0 standard drinks    Comment: has not had alcohol in a month  . Drug use: No  . Sexual activity: Not on file

## 2019-11-21 ENCOUNTER — Other Ambulatory Visit: Payer: Self-pay

## 2019-11-21 ENCOUNTER — Inpatient Hospital Stay (HOSPITAL_COMMUNITY)
Admission: EM | Admit: 2019-11-21 | Discharge: 2019-11-26 | DRG: 628 | Disposition: A | Discharge status: Home / Self Care | Payer: Medicare Other | Attending: Internal Medicine | Admitting: Internal Medicine

## 2019-11-21 DIAGNOSIS — I252 Old myocardial infarction: Secondary | ICD-10-CM

## 2019-11-21 DIAGNOSIS — Z89432 Acquired absence of left foot: Secondary | ICD-10-CM

## 2019-11-21 DIAGNOSIS — I132 Hypertensive heart and chronic kidney disease with heart failure and with stage 5 chronic kidney disease, or end stage renal disease: Secondary | ICD-10-CM | POA: Diagnosis present

## 2019-11-21 DIAGNOSIS — R7401 Elevation of levels of liver transaminase levels: Secondary | ICD-10-CM

## 2019-11-21 DIAGNOSIS — Z6832 Body mass index (BMI) 32.0-32.9, adult: Secondary | ICD-10-CM

## 2019-11-21 DIAGNOSIS — E8779 Other fluid overload: Secondary | ICD-10-CM | POA: Diagnosis present

## 2019-11-21 DIAGNOSIS — Z95828 Presence of other vascular implants and grafts: Secondary | ICD-10-CM

## 2019-11-21 DIAGNOSIS — I080 Rheumatic disorders of both mitral and aortic valves: Secondary | ICD-10-CM | POA: Diagnosis present

## 2019-11-21 DIAGNOSIS — R55 Syncope and collapse: Secondary | ICD-10-CM | POA: Diagnosis not present

## 2019-11-21 DIAGNOSIS — I4581 Long QT syndrome: Secondary | ICD-10-CM | POA: Diagnosis present

## 2019-11-21 DIAGNOSIS — Z955 Presence of coronary angioplasty implant and graft: Secondary | ICD-10-CM

## 2019-11-21 DIAGNOSIS — I440 Atrioventricular block, first degree: Secondary | ICD-10-CM | POA: Diagnosis present

## 2019-11-21 DIAGNOSIS — Z794 Long term (current) use of insulin: Secondary | ICD-10-CM

## 2019-11-21 DIAGNOSIS — D631 Anemia in chronic kidney disease: Secondary | ICD-10-CM | POA: Diagnosis present

## 2019-11-21 DIAGNOSIS — E875 Hyperkalemia: Principal | ICD-10-CM | POA: Diagnosis present

## 2019-11-21 DIAGNOSIS — G4733 Obstructive sleep apnea (adult) (pediatric): Secondary | ICD-10-CM | POA: Diagnosis present

## 2019-11-21 DIAGNOSIS — Z419 Encounter for procedure for purposes other than remedying health state, unspecified: Secondary | ICD-10-CM

## 2019-11-21 DIAGNOSIS — R001 Bradycardia, unspecified: Secondary | ICD-10-CM | POA: Diagnosis present

## 2019-11-21 DIAGNOSIS — N170 Acute kidney failure with tubular necrosis: Secondary | ICD-10-CM | POA: Diagnosis present

## 2019-11-21 DIAGNOSIS — T82528A Displacement of other cardiac and vascular devices and implants, initial encounter: Secondary | ICD-10-CM

## 2019-11-21 DIAGNOSIS — E1151 Type 2 diabetes mellitus with diabetic peripheral angiopathy without gangrene: Secondary | ICD-10-CM | POA: Diagnosis present

## 2019-11-21 DIAGNOSIS — E1165 Type 2 diabetes mellitus with hyperglycemia: Secondary | ICD-10-CM | POA: Diagnosis present

## 2019-11-21 DIAGNOSIS — E669 Obesity, unspecified: Secondary | ICD-10-CM | POA: Diagnosis present

## 2019-11-21 DIAGNOSIS — N2581 Secondary hyperparathyroidism of renal origin: Secondary | ICD-10-CM | POA: Diagnosis present

## 2019-11-21 DIAGNOSIS — Z89511 Acquired absence of right leg below knee: Secondary | ICD-10-CM

## 2019-11-21 DIAGNOSIS — Z79899 Other long term (current) drug therapy: Secondary | ICD-10-CM

## 2019-11-21 DIAGNOSIS — N183 Chronic kidney disease, stage 3 unspecified: Secondary | ICD-10-CM

## 2019-11-21 DIAGNOSIS — E1122 Type 2 diabetes mellitus with diabetic chronic kidney disease: Secondary | ICD-10-CM | POA: Diagnosis present

## 2019-11-21 DIAGNOSIS — I5042 Chronic combined systolic (congestive) and diastolic (congestive) heart failure: Secondary | ICD-10-CM | POA: Diagnosis present

## 2019-11-21 DIAGNOSIS — N186 End stage renal disease: Secondary | ICD-10-CM | POA: Diagnosis present

## 2019-11-21 DIAGNOSIS — Z7902 Long term (current) use of antithrombotics/antiplatelets: Secondary | ICD-10-CM

## 2019-11-21 DIAGNOSIS — I2510 Atherosclerotic heart disease of native coronary artery without angina pectoris: Secondary | ICD-10-CM | POA: Diagnosis present

## 2019-11-21 DIAGNOSIS — R778 Other specified abnormalities of plasma proteins: Secondary | ICD-10-CM

## 2019-11-21 DIAGNOSIS — E1142 Type 2 diabetes mellitus with diabetic polyneuropathy: Secondary | ICD-10-CM | POA: Diagnosis present

## 2019-11-21 DIAGNOSIS — N179 Acute kidney failure, unspecified: Secondary | ICD-10-CM | POA: Diagnosis present

## 2019-11-21 DIAGNOSIS — Z992 Dependence on renal dialysis: Secondary | ICD-10-CM

## 2019-11-21 DIAGNOSIS — I4891 Unspecified atrial fibrillation: Secondary | ICD-10-CM | POA: Diagnosis present

## 2019-11-21 DIAGNOSIS — R945 Abnormal results of liver function studies: Secondary | ICD-10-CM | POA: Diagnosis present

## 2019-11-21 DIAGNOSIS — Z20822 Contact with and (suspected) exposure to covid-19: Secondary | ICD-10-CM | POA: Diagnosis present

## 2019-11-21 DIAGNOSIS — Z96642 Presence of left artificial hip joint: Secondary | ICD-10-CM | POA: Diagnosis present

## 2019-11-21 DIAGNOSIS — F17210 Nicotine dependence, cigarettes, uncomplicated: Secondary | ICD-10-CM | POA: Diagnosis present

## 2019-11-21 DIAGNOSIS — Z7982 Long term (current) use of aspirin: Secondary | ICD-10-CM

## 2019-11-21 DIAGNOSIS — Z833 Family history of diabetes mellitus: Secondary | ICD-10-CM

## 2019-11-21 NOTE — ED Triage Notes (Signed)
Pt laying in bed today, had a lot of built up mucus from nose down back of throat, gagged on mucus, vomited mucus.  Pt reports he has been having this problem for "a while".

## 2019-11-22 ENCOUNTER — Inpatient Hospital Stay (HOSPITAL_COMMUNITY): Payer: Medicare Other

## 2019-11-22 ENCOUNTER — Emergency Department (HOSPITAL_COMMUNITY): Payer: Medicare Other

## 2019-11-22 DIAGNOSIS — E1165 Type 2 diabetes mellitus with hyperglycemia: Secondary | ICD-10-CM | POA: Diagnosis present

## 2019-11-22 DIAGNOSIS — R001 Bradycardia, unspecified: Secondary | ICD-10-CM | POA: Diagnosis present

## 2019-11-22 DIAGNOSIS — I132 Hypertensive heart and chronic kidney disease with heart failure and with stage 5 chronic kidney disease, or end stage renal disease: Secondary | ICD-10-CM | POA: Diagnosis present

## 2019-11-22 DIAGNOSIS — E1122 Type 2 diabetes mellitus with diabetic chronic kidney disease: Secondary | ICD-10-CM | POA: Diagnosis present

## 2019-11-22 DIAGNOSIS — I351 Nonrheumatic aortic (valve) insufficiency: Secondary | ICD-10-CM | POA: Diagnosis not present

## 2019-11-22 DIAGNOSIS — L97929 Non-pressure chronic ulcer of unspecified part of left lower leg with unspecified severity: Secondary | ICD-10-CM | POA: Diagnosis not present

## 2019-11-22 DIAGNOSIS — N179 Acute kidney failure, unspecified: Secondary | ICD-10-CM | POA: Diagnosis present

## 2019-11-22 DIAGNOSIS — F17210 Nicotine dependence, cigarettes, uncomplicated: Secondary | ICD-10-CM | POA: Diagnosis present

## 2019-11-22 DIAGNOSIS — N2581 Secondary hyperparathyroidism of renal origin: Secondary | ICD-10-CM | POA: Diagnosis present

## 2019-11-22 DIAGNOSIS — Z992 Dependence on renal dialysis: Secondary | ICD-10-CM | POA: Diagnosis not present

## 2019-11-22 DIAGNOSIS — N178 Other acute kidney failure: Secondary | ICD-10-CM | POA: Diagnosis not present

## 2019-11-22 DIAGNOSIS — E8770 Fluid overload, unspecified: Secondary | ICD-10-CM | POA: Diagnosis not present

## 2019-11-22 DIAGNOSIS — I10 Essential (primary) hypertension: Secondary | ICD-10-CM | POA: Diagnosis not present

## 2019-11-22 DIAGNOSIS — E1151 Type 2 diabetes mellitus with diabetic peripheral angiopathy without gangrene: Secondary | ICD-10-CM | POA: Diagnosis present

## 2019-11-22 DIAGNOSIS — I4891 Unspecified atrial fibrillation: Secondary | ICD-10-CM | POA: Diagnosis present

## 2019-11-22 DIAGNOSIS — I252 Old myocardial infarction: Secondary | ICD-10-CM | POA: Diagnosis not present

## 2019-11-22 DIAGNOSIS — I2510 Atherosclerotic heart disease of native coronary artery without angina pectoris: Secondary | ICD-10-CM | POA: Diagnosis present

## 2019-11-22 DIAGNOSIS — R945 Abnormal results of liver function studies: Secondary | ICD-10-CM | POA: Diagnosis present

## 2019-11-22 DIAGNOSIS — D631 Anemia in chronic kidney disease: Secondary | ICD-10-CM | POA: Diagnosis present

## 2019-11-22 DIAGNOSIS — I480 Paroxysmal atrial fibrillation: Secondary | ICD-10-CM | POA: Diagnosis not present

## 2019-11-22 DIAGNOSIS — N19 Unspecified kidney failure: Secondary | ICD-10-CM | POA: Diagnosis not present

## 2019-11-22 DIAGNOSIS — E1142 Type 2 diabetes mellitus with diabetic polyneuropathy: Secondary | ICD-10-CM | POA: Diagnosis present

## 2019-11-22 DIAGNOSIS — E1100 Type 2 diabetes mellitus with hyperosmolarity without nonketotic hyperglycemic-hyperosmolar coma (NKHHC): Secondary | ICD-10-CM | POA: Diagnosis not present

## 2019-11-22 DIAGNOSIS — I440 Atrioventricular block, first degree: Secondary | ICD-10-CM | POA: Diagnosis present

## 2019-11-22 DIAGNOSIS — E8779 Other fluid overload: Secondary | ICD-10-CM | POA: Diagnosis present

## 2019-11-22 DIAGNOSIS — N170 Acute kidney failure with tubular necrosis: Secondary | ICD-10-CM | POA: Diagnosis present

## 2019-11-22 DIAGNOSIS — N184 Chronic kidney disease, stage 4 (severe): Secondary | ICD-10-CM | POA: Diagnosis not present

## 2019-11-22 DIAGNOSIS — G4733 Obstructive sleep apnea (adult) (pediatric): Secondary | ICD-10-CM | POA: Diagnosis present

## 2019-11-22 DIAGNOSIS — I4581 Long QT syndrome: Secondary | ICD-10-CM | POA: Diagnosis present

## 2019-11-22 DIAGNOSIS — Z20822 Contact with and (suspected) exposure to covid-19: Secondary | ICD-10-CM | POA: Diagnosis present

## 2019-11-22 DIAGNOSIS — N185 Chronic kidney disease, stage 5: Secondary | ICD-10-CM

## 2019-11-22 DIAGNOSIS — R0600 Dyspnea, unspecified: Secondary | ICD-10-CM | POA: Diagnosis not present

## 2019-11-22 DIAGNOSIS — R55 Syncope and collapse: Secondary | ICD-10-CM | POA: Diagnosis present

## 2019-11-22 DIAGNOSIS — I5042 Chronic combined systolic (congestive) and diastolic (congestive) heart failure: Secondary | ICD-10-CM | POA: Diagnosis present

## 2019-11-22 DIAGNOSIS — E875 Hyperkalemia: Secondary | ICD-10-CM | POA: Diagnosis present

## 2019-11-22 DIAGNOSIS — N186 End stage renal disease: Secondary | ICD-10-CM | POA: Diagnosis present

## 2019-11-22 DIAGNOSIS — R0601 Orthopnea: Secondary | ICD-10-CM | POA: Diagnosis not present

## 2019-11-22 DIAGNOSIS — I340 Nonrheumatic mitral (valve) insufficiency: Secondary | ICD-10-CM | POA: Diagnosis not present

## 2019-11-22 DIAGNOSIS — I739 Peripheral vascular disease, unspecified: Secondary | ICD-10-CM | POA: Diagnosis not present

## 2019-11-22 DIAGNOSIS — E669 Obesity, unspecified: Secondary | ICD-10-CM | POA: Diagnosis present

## 2019-11-22 DIAGNOSIS — I482 Chronic atrial fibrillation, unspecified: Secondary | ICD-10-CM | POA: Diagnosis not present

## 2019-11-22 DIAGNOSIS — N189 Chronic kidney disease, unspecified: Secondary | ICD-10-CM | POA: Diagnosis not present

## 2019-11-22 DIAGNOSIS — I080 Rheumatic disorders of both mitral and aortic valves: Secondary | ICD-10-CM | POA: Diagnosis present

## 2019-11-22 LAB — CBC WITH DIFFERENTIAL/PLATELET
Abs Immature Granulocytes: 0.09 10*3/uL — ABNORMAL HIGH (ref 0.00–0.07)
Basophils Absolute: 0 10*3/uL (ref 0.0–0.1)
Basophils Relative: 1 %
Eosinophils Absolute: 0.2 10*3/uL (ref 0.0–0.5)
Eosinophils Relative: 2 %
HCT: 41.5 % (ref 39.0–52.0)
Hemoglobin: 13 g/dL (ref 13.0–17.0)
Immature Granulocytes: 1 %
Lymphocytes Relative: 16 %
Lymphs Abs: 1.4 10*3/uL (ref 0.7–4.0)
MCH: 26.8 pg (ref 26.0–34.0)
MCHC: 31.3 g/dL (ref 30.0–36.0)
MCV: 85.6 fL (ref 80.0–100.0)
Monocytes Absolute: 0.9 10*3/uL (ref 0.1–1.0)
Monocytes Relative: 11 %
Neutro Abs: 6 10*3/uL (ref 1.7–7.7)
Neutrophils Relative %: 69 %
Platelets: 247 10*3/uL (ref 150–400)
RBC: 4.85 MIL/uL (ref 4.22–5.81)
RDW: 14 % (ref 11.5–15.5)
WBC: 8.5 10*3/uL (ref 4.0–10.5)
nRBC: 0 % (ref 0.0–0.2)

## 2019-11-22 LAB — RESPIRATORY PANEL BY RT PCR (FLU A&B, COVID)
Influenza A by PCR: NEGATIVE
Influenza B by PCR: NEGATIVE
SARS Coronavirus 2 by RT PCR: NEGATIVE

## 2019-11-22 LAB — COMPREHENSIVE METABOLIC PANEL
ALT: 75 U/L — ABNORMAL HIGH (ref 0–44)
AST: 111 U/L — ABNORMAL HIGH (ref 15–41)
Albumin: 2.3 g/dL — ABNORMAL LOW (ref 3.5–5.0)
Alkaline Phosphatase: 63 U/L (ref 38–126)
Anion gap: 13 (ref 5–15)
BUN: 62 mg/dL — ABNORMAL HIGH (ref 6–20)
CO2: 19 mmol/L — ABNORMAL LOW (ref 22–32)
Calcium: 8.3 mg/dL — ABNORMAL LOW (ref 8.9–10.3)
Chloride: 103 mmol/L (ref 98–111)
Creatinine, Ser: 5.32 mg/dL — ABNORMAL HIGH (ref 0.61–1.24)
GFR calc Af Amer: 13 mL/min — ABNORMAL LOW (ref >60)
GFR calc non Af Amer: 12 mL/min — ABNORMAL LOW (ref >60)
Glucose, Bld: 144 mg/dL — ABNORMAL HIGH (ref 70–99)
Potassium: 6.9 mmol/L (ref 3.5–5.1)
Sodium: 135 mmol/L (ref 135–145)
Total Bilirubin: 0.7 mg/dL (ref 0.3–1.2)
Total Protein: 6.3 g/dL — ABNORMAL LOW (ref 6.5–8.1)

## 2019-11-22 LAB — HEPATITIS PANEL, ACUTE
HCV Ab: NONREACTIVE
Hep A IgM: NONREACTIVE
Hep B C IgM: NONREACTIVE
Hepatitis B Surface Ag: NONREACTIVE

## 2019-11-22 LAB — TSH: TSH: 2.175 u[IU]/mL (ref 0.350–4.500)

## 2019-11-22 LAB — CBG MONITORING, ED
Glucose-Capillary: 136 mg/dL — ABNORMAL HIGH (ref 70–99)
Glucose-Capillary: 159 mg/dL — ABNORMAL HIGH (ref 70–99)

## 2019-11-22 LAB — GLUCOSE, CAPILLARY
Glucose-Capillary: 125 mg/dL — ABNORMAL HIGH (ref 70–99)
Glucose-Capillary: 146 mg/dL — ABNORMAL HIGH (ref 70–99)

## 2019-11-22 LAB — HEPATITIS B CORE ANTIBODY, IGM: Hep B C IgM: NONREACTIVE

## 2019-11-22 LAB — BASIC METABOLIC PANEL
Anion gap: 12 (ref 5–15)
BUN: 65 mg/dL — ABNORMAL HIGH (ref 6–20)
CO2: 18 mmol/L — ABNORMAL LOW (ref 22–32)
Calcium: 8.1 mg/dL — ABNORMAL LOW (ref 8.9–10.3)
Chloride: 105 mmol/L (ref 98–111)
Creatinine, Ser: 5.21 mg/dL — ABNORMAL HIGH (ref 0.61–1.24)
GFR calc Af Amer: 14 mL/min — ABNORMAL LOW (ref >60)
GFR calc non Af Amer: 12 mL/min — ABNORMAL LOW (ref >60)
Glucose, Bld: 164 mg/dL — ABNORMAL HIGH (ref 70–99)
Potassium: 6.3 mmol/L (ref 3.5–5.1)
Sodium: 135 mmol/L (ref 135–145)

## 2019-11-22 LAB — BRAIN NATRIURETIC PEPTIDE: B Natriuretic Peptide: 241.7 pg/mL — ABNORMAL HIGH (ref 0.0–100.0)

## 2019-11-22 LAB — MRSA PCR SCREENING: MRSA by PCR: NEGATIVE

## 2019-11-22 LAB — HIV ANTIBODY (ROUTINE TESTING W REFLEX): HIV Screen 4th Generation wRfx: NONREACTIVE

## 2019-11-22 LAB — TROPONIN I (HIGH SENSITIVITY)
Troponin I (High Sensitivity): 90 ng/L — ABNORMAL HIGH (ref <18)
Troponin I (High Sensitivity): 89 ng/L — ABNORMAL HIGH (ref <18)

## 2019-11-22 LAB — HEPATITIS B SURFACE ANTIBODY,QUALITATIVE: Hep B S Ab: NONREACTIVE

## 2019-11-22 LAB — POC SARS CORONAVIRUS 2 AG -  ED: SARS Coronavirus 2 Ag: NEGATIVE

## 2019-11-22 LAB — HEMOGLOBIN A1C
Hgb A1c MFr Bld: 14.7 % — ABNORMAL HIGH (ref 4.8–5.6)
Mean Plasma Glucose: 375.19 mg/dL

## 2019-11-22 LAB — MAGNESIUM: Magnesium: 2 mg/dL (ref 1.7–2.4)

## 2019-11-22 LAB — HEPATITIS B SURFACE ANTIGEN: Hepatitis B Surface Ag: NONREACTIVE

## 2019-11-22 MED ORDER — SODIUM ZIRCONIUM CYCLOSILICATE 10 G PO PACK
10.0000 g | PACK | Freq: Once | ORAL | Status: AC
  Administered 2019-11-22: 07:00:00 10 g via ORAL
  Filled 2019-11-22: qty 1

## 2019-11-22 MED ORDER — PANTOPRAZOLE SODIUM 40 MG PO TBEC
40.0000 mg | DELAYED_RELEASE_TABLET | Freq: Every day | ORAL | Status: DC
  Administered 2019-11-23 – 2019-11-24 (×2): 40 mg via ORAL
  Filled 2019-11-22 (×3): qty 1

## 2019-11-22 MED ORDER — ONDANSETRON HCL 4 MG/2ML IJ SOLN: 4.0000 mg | Freq: Four times a day (QID) | INTRAMUSCULAR | Status: DC | PRN

## 2019-11-22 MED ORDER — CALCIUM GLUCONATE-NACL 1-0.675 GM/50ML-% IV SOLN
1.0000 g | Freq: Once | INTRAVENOUS | Status: AC
  Administered 2019-11-22: 10:00:00 1000 mg via INTRAVENOUS
  Filled 2019-11-22: qty 50

## 2019-11-22 MED ORDER — HYDRALAZINE HCL 50 MG PO TABS
75.0000 mg | ORAL_TABLET | Freq: Three times a day (TID) | ORAL | Status: DC
  Administered 2019-11-22 (×2): 75 mg via ORAL
  Filled 2019-11-22 (×3): qty 2

## 2019-11-22 MED ORDER — METOPROLOL SUCCINATE ER 50 MG PO TB24
50.0000 mg | ORAL_TABLET | Freq: Every day | ORAL | Status: DC
  Administered 2019-11-22: 50 mg via ORAL
  Filled 2019-11-22: qty 1

## 2019-11-22 MED ORDER — HEPARIN SODIUM (PORCINE) 1000 UNIT/ML IJ SOLN
INTRAMUSCULAR | Status: AC
  Administered 2019-11-22: 15:00:00 2400 [IU]
  Filled 2019-11-22: qty 4

## 2019-11-22 MED ORDER — HEPARIN SODIUM (PORCINE) 1000 UNIT/ML IJ SOLN
2.4000 mL | Freq: Once | INTRAMUSCULAR | Status: AC
  Filled 2019-11-22: qty 2.4

## 2019-11-22 MED ORDER — FUROSEMIDE 10 MG/ML IJ SOLN
160.0000 mg | Freq: Once | INTRAVENOUS | Status: AC
  Administered 2019-11-22: 160 mg via INTRAVENOUS
  Filled 2019-11-22: qty 16

## 2019-11-22 MED ORDER — ACETAMINOPHEN 325 MG PO TABS
650.0000 mg | ORAL_TABLET | ORAL | Status: DC | PRN
  Administered 2019-11-22: 650 mg via ORAL
  Filled 2019-11-22 (×2): qty 2

## 2019-11-22 MED ORDER — APIXABAN 5 MG PO TABS
5.0000 mg | ORAL_TABLET | Freq: Two times a day (BID) | ORAL | Status: DC
  Administered 2019-11-22: 06:00:00 5 mg via ORAL
  Filled 2019-11-22 (×2): qty 1

## 2019-11-22 MED ORDER — INSULIN ASPART 100 UNIT/ML IV SOLN
5.0000 [IU] | Freq: Once | INTRAVENOUS | Status: AC
  Administered 2019-11-22: 07:00:00 5 [IU] via INTRAVENOUS

## 2019-11-22 MED ORDER — SODIUM BICARBONATE 8.4 % IV SOLN
50.0000 meq | Freq: Once | INTRAVENOUS | Status: AC
  Administered 2019-11-22: 07:00:00 50 meq via INTRAVENOUS
  Filled 2019-11-22: qty 50

## 2019-11-22 MED ORDER — HEPARIN SODIUM (PORCINE) 5000 UNIT/ML IJ SOLN
5000.0000 [IU] | Freq: Three times a day (TID) | INTRAMUSCULAR | Status: DC
  Administered 2019-11-22 – 2019-11-26 (×9): 5000 [IU] via SUBCUTANEOUS
  Filled 2019-11-22 (×9): qty 1

## 2019-11-22 MED ORDER — CHLORHEXIDINE GLUCONATE CLOTH 2 % EX PADS
6.0000 | MEDICATED_PAD | Freq: Every day | CUTANEOUS | Status: DC
  Administered 2019-11-22 – 2019-11-25 (×3): 6 via TOPICAL

## 2019-11-22 MED ORDER — INSULIN ASPART 100 UNIT/ML ~~LOC~~ SOLN
0.0000 [IU] | Freq: Three times a day (TID) | SUBCUTANEOUS | Status: DC
  Administered 2019-11-22 – 2019-11-23 (×3): 1 [IU] via SUBCUTANEOUS
  Administered 2019-11-23: 18:00:00 3 [IU] via SUBCUTANEOUS
  Administered 2019-11-23: 1 [IU] via SUBCUTANEOUS
  Administered 2019-11-24 (×2): 2 [IU] via SUBCUTANEOUS
  Administered 2019-11-24: 3 [IU] via SUBCUTANEOUS
  Administered 2019-11-25: 1 [IU] via SUBCUTANEOUS
  Administered 2019-11-25 – 2019-11-26 (×2): 3 [IU] via SUBCUTANEOUS

## 2019-11-22 MED ORDER — ORAL CARE MOUTH RINSE
15.0000 mL | Freq: Two times a day (BID) | OROMUCOSAL | Status: DC
  Administered 2019-11-22 – 2019-11-25 (×6): 15 mL via OROMUCOSAL

## 2019-11-22 MED ORDER — TICAGRELOR 90 MG PO TABS
90.0000 mg | ORAL_TABLET | Freq: Two times a day (BID) | ORAL | Status: DC
  Administered 2019-11-22 – 2019-11-25 (×6): 90 mg via ORAL
  Filled 2019-11-22 (×6): qty 1

## 2019-11-22 MED ORDER — ALPRAZOLAM 0.25 MG PO TABS: 0.2500 mg | ORAL_TABLET | Freq: Three times a day (TID) | ORAL | Status: DC | PRN

## 2019-11-22 MED ORDER — DEXTROSE 50 % IV SOLN
1.0000 | Freq: Once | INTRAVENOUS | Status: AC
  Administered 2019-11-22: 07:00:00 50 mL via INTRAVENOUS
  Filled 2019-11-22: qty 50

## 2019-11-22 MED ORDER — ALBUTEROL SULFATE (2.5 MG/3ML) 0.083% IN NEBU: 10.0000 mg | INHALATION_SOLUTION | Freq: Once | RESPIRATORY_TRACT | Status: DC

## 2019-11-22 NOTE — H&P (Addendum)
NAME:  Edward [name], MRN:  [id number], DOB:  [birth year], LOS: 0 ADMISSION DATE:  11/21/2019, CONSULTATION DATE:  11/22/19 REFERRING MD:  EDP, CHIEF COMPLAINT:  Chest congestion   Brief History   Edward [name] is a 49 y.o male with HTN, HFmrEF (LVEF 45-50%), CAD s/p DES to Cx in 2019, and DM who presented to the ED after syncopal episode. He was found to be hypervolemic with acute on chronic renal failure with hyperkalemia. Nephrology was consulted for possible HD and PCCM was consulted for possible admission.   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 For the last several weeks he has had a significanbt amount of trouble with mucus drainage that causes him to be [facility] and nauseous. Yesterday he became extremely nauseous, cold, and diaphoretic. He subsequently syncopized. His son subsequently called EMS and he was brought to the ED  Over the last several months he has had issues with a bitter taste in his mouth, progressive DOE (but feels his mobility is more limited by his right BKA), + orthopnea, pruritis, and diffuse weakness. He has not urinated since the AM of 4/4. He knew that he had issues with his kidneys and has had conversations about HD.   Past Medical History   Past Medical History:  Diagnosis Date  . Anxiety    self reported  . Arthritis   . CHF (congestive heart failure) ([place])   . Coronary artery disease   . Depression    self reported  . Diabetes mellitus   . Hypertension    Significant Hospital Events   4/5: Admitted to hospital with acute on chronic renal failure  Consults:  Nephrology  Procedures:  04/05 Rt .CVC>  Significant Diagnostic Tests:  4/5 CXR >> Bilateral pulmonary edema, small bilateral pleural effusions, enlarged cardiac silhouette  4/5 EKG Regularly, irregular rhythm with bradycardia and bigeminy   Micro Data:  SARs  COVID >> Pending  Antimicrobials:  N/A  Interim history/subjective:  See above  Objective   Blood pressure (!) 142/93, pulse (!) 58, temperature 98.6 F (37 C), temperature source Oral, resp. rate 20, height 6' 1"  (1.854 m), weight 113.4 kg, SpO2 96 %.       No intake or output data in the 24 hours ending 11/22/19 0759 Filed Weights   11/21/19 2046  Weight: 113.4 kg   Examination: Physical Exam  Constitutional: He is oriented to person, place, and time. No distress.  HENT:  Head: Normocephalic and atraumatic.  Eyes: EOM are normal.  Neck: No JVD present.  Cardiovascular: An irregular rhythm present. Bradycardia present.  No murmur heard. Pulmonary/Chest: Effort normal. He has no wheezes. He has no rales.  Decreased breath sounds  Abdominal: Soft. He exhibits no distension. There is no abdominal tenderness.  Musculoskeletal:        General: Edema (1-2+ pitting edema to the knees) present. No tenderness. Normal range of motion.     Cervical back: Normal range of motion.  Neurological: He is alert and oriented to person, place, and time.  Skin: Skin is warm. No rash noted. He is diaphoretic.    Resolved Hospital Problem list   N/A  Assessment & Plan:   Acute on CKD with new ESRD: Hyperkalemia: Patient presents with acutely worsening kidney function with electrolyte abnormalities and signs of uremia.  Spoke to nephrology  who tentatively plan to dialyze the patient.  - Received albuterol, insulin, Lokelma  - Will start 160 mg of furosemide IV - Repeat BMP - Nephrology consulted for recommendations regarding HD - Will place a temporary Rt. IJ CVC  Syncope: Patient presented with a single episode of syncope yesterday evening. States that he did have prodromal symptoms consisting of diaphoresis and cold sensation. Patient remembers "eyes going dark" and could see for a few seconds. Patient does have a history of CAD with DES in the Cx in 2019 and his most recent echo  (12/2018) shows EF of 45-50% with mildly dilated LV and severely dilated right LA. Moderate MVR. Mild aortic valve thickness Patient presented to the ED with an EKG concerning for AF and was started on apixaban for 1 dose. Upon review of the EKG, the patient was bradycardic in the 50's with regularly, irregular rhythm with bigeminy pattern. He has not had any episodes of hypotension. Patient clinically appears hypervolemic. Orthostatic labs have not been performed. Patient is neurovascularly intact. - May need repeat of echocardiogram. - Telemetry - Will be admitted to ICU for monitoring.  HFmrEF  CAD s/p DES to Cx in 2019 (EF 40-45%) Patient has signs symptoms concerning for mild hypervolemia including lower extremity edema and symmetric interstitial airspace opacities and small pleural effusions.  Patient does admit to some chest pain this morning in the ED, but is not currently having chest pain.  Change in troponin was 90>89 and BNP was 241.7.  Patient does admit to shortness of breath with exertion and orthopnea.  He admits to intermittent medication adherence secondary to large pill burden. -Furosemide 160 mg IV -Strict ins and outs, daily weights -Fluid and salt restriction  Atrial fibrillation: Patient had an EKG in the ED concerning for atrial fibrillation.  CHA2DS2-VASc score was calculated and determined before and patient was started on apixaban 5 mg.  He received his first dose this morning at 6 AM. On review of the EKG, the the rhythm appears to be regular early regular with a bigeminy pattern. I will discontinue apixaban now. Patient has a past history of anemia, but not currently anemic. He does have a history of OSA, not on CPAP. There is no overt signs of EKG changing secondary to hyperkalemia. -Discontinue apixaban - TSH - CPAP at night prn - Continue telemetry   Hypertension: -Hold antihypertensive medications until we have decided the patient needs hemodialysis.  Type 2  diabetes mellitus: Patient presented with hyperglycemia greater than 500.  Patient was given 5 units IV NovoLog in the ED with improvement of his blood glucose to 159.  We will start CBG monitoring and give sliding scale insulin as needed.  Patient takes 20 units Lantus twice daily and 15 units of NovoLog with meals. -CBG monitoring TID daily. -Sliding scale renal/sensitive. (will likely need monitor CBG and adjust insulin accordingly) - Hgb A1c pending  Transaminase elevation: Hepatocellular pattern of labs likely secondary to metabolic associated liver disease given his history of diabetes and obesity. Denies IV drug use or risky sexual history. Not currently sexually active. - Screen for HCV and HBV  - Monitor CMP  Best practice:  Diet: NPO Pain/Anxiety/Delirium protocol (if indicated): n/a VAP protocol (if indicated): n/a DVT prophylaxis: heparin  GI prophylaxis: PPI  Glucose control: SSI  Mobility: bedrest  Code Status: Full code  Family Communication: none yea, but lives with two sons in [place] Disposition:   Labs   CBC: Recent Labs  Lab 11/22/19 0411  WBC 8.5  NEUTROABS 6.0  HGB 13.0  HCT 41.5  MCV 85.6  PLT 361    Basic Metabolic Panel: Recent Labs  Lab 11/22/19 0411  NA 135  K 6.9*  CL 103  CO2 19*  GLUCOSE 144*  BUN 62*  CREATININE 5.32*  CALCIUM 8.3*  MG 2.0   GFR: Estimated Creatinine Clearance: 21.9 mL/min (A) (by C-G formula based on SCr of 5.32 mg/dL (H)). Recent Labs  Lab 11/22/19 0411  WBC 8.5    Liver Function Tests: Recent Labs  Lab 11/22/19 0411  AST 111*  ALT 75*  ALKPHOS 63  BILITOT 0.7  PROT 6.3*  ALBUMIN 2.3*   No results for input(s): LIPASE, AMYLASE in the last 168 hours. No results for input(s): AMMONIA in the last 168 hours.  ABG    Component Value Date/Time   PHART 7.403 08/14/2011 0557   PCO2ART 30.4 (L) 08/14/2011 0557   PO2ART 73.0 (L) 08/14/2011 0557   HCO3 19.0 (L) 08/14/2011 0557   TCO2 25 05/04/2014  1007   ACIDBASEDEF 5.0 (H) 08/14/2011 0557   O2SAT 95.0 08/14/2011 0557     Coagulation Profile: No results for input(s): INR, PROTIME in the last 168 hours.  Cardiac Enzymes: No results for input(s): CKTOTAL, CKMB, CKMBINDEX, TROPONINI in the last 168 hours.  HbA1C: Hgb A1c MFr Bld  Date/Time Value Ref Range Status  12/22/2018 12:24 AM 7.5 (H) 4.8 - 5.6 % Final    Comment:    (NOTE) Pre diabetes:          5.7%-6.4% Diabetes:              >6.4% Glycemic control for   <7.0% adults with diabetes   07/29/2018 02:32 AM 6.7 (H) 4.8 - 5.6 % Final    Comment:    (NOTE) Pre diabetes:          5.7%-6.4% Diabetes:              >6.4% Glycemic control for   <7.0% adults with diabetes     CBG: Recent Labs  Lab 11/22/19 0404  GLUCAP 136*    Review of Systems:   Chest pain, shortness of breath, nausea, vomiting, LE edema, pruritis.   Past Medical History  He,  has a past medical history of Anxiety, Arthritis, CHF (congestive heart failure) ([place]), Coronary artery disease, Depression, Diabetes mellitus, and Hypertension.   Surgical History    Past Surgical History:  Procedure Laterality Date  . AMPUTATION Left 05/10/2016   Procedure: Left Transmetatarsal Amputation;  Surgeon: [name], MD;  Location: [place];  Service: Orthopedics;  Laterality: Left;  . AMPUTATION Right 05/16/2018   Procedure: PARTIAL RAY AMPUTATION FIFTH TOE RIGHT FOOT;  Surgeon: [name], DPM;  Location: [place];  Service: Podiatry;  Laterality: Right;  . AMPUTATION Right 06/17/2018   Procedure: AMPUTATION 4th RAY Right Foot;  Surgeon: Trula Slade, DPM;  Location: [place];  Service: Podiatry;  Laterality: Right;  . AMPUTATION Right 06/19/2018   Procedure: RIGHT BELOW KNEE AMPUTATION;  Surgeon: [name], MD;  Location: [place];  Service: Orthopedics;  Laterality: Right;  . APPLICATION OF WOUND VAC Right 06/19/2018   Procedure: APPLICATION OF WOUND VAC;  Surgeon: [name], MD;  Location: [place];  Service: Orthopedics;  Laterality: Right;  . CARDIAC CATHETERIZATION  07/29/2018  . CIRCUMCISION  09/02/2011   Procedure: CIRCUMCISION ADULT;  Surgeon: Molli Hazard, MD;  Location: [place];  Service:  Urology;  Laterality: N/A;  . CORONARY STENT INTERVENTION N/A 07/29/2018   Procedure: CORONARY STENT INTERVENTION;  Surgeon: Leonie Man, MD;  Location: [place] [facility];  Service: Cardiovascular;  Laterality: N/A;  . I & D EXTREMITY  09/02/2011   Procedure: IRRIGATION AND DEBRIDEMENT EXTREMITY;  Surgeon: Theodoro Kos, DO;  Location: [place];  Service: Plastics;  Laterality: N/A;  . INCISION AND DRAINAGE OF WOUND  08/12/2011   Procedure: IRRIGATION AND DEBRIDEMENT WOUND;  Surgeon: Zenovia Jarred, MD;  Location: [place];  Service: General;;  perineum  . IRRIGATION AND DEBRIDEMENT ABSCESS  08/12/2011   Procedure: IRRIGATION AND DEBRIDEMENT ABSCESS;  Surgeon: Molli Hazard, MD;  Location: [place];  Service: Urology;  Laterality: N/A;  irrigation and debridment perineum    . IRRIGATION AND DEBRIDEMENT ABSCESS  08/14/2011   Procedure: MINOR INCISION AND DRAINAGE OF ABSCESS;  Surgeon: Molli Hazard, MD;  Location: [place];  Service: Urology;  Laterality: N/A;  Perineal Wound Debridement;Placement Bilateral Testicular Thigh Pouches  . LEFT HEART CATH AND CORONARY ANGIOGRAPHY N/A 07/29/2018   Procedure: LEFT HEART CATH AND CORONARY ANGIOGRAPHY;  Surgeon: Leonie Man, MD;  Location: [place] [facility];  Service: Cardiovascular;  Laterality: N/A;  . TOTAL HIP ARTHROPLASTY Left 10/11/2016   Procedure: LEFT TOTAL HIP ARTHROPLASTY ANTERIOR APPROACH;  Surgeon: Mcarthur Rossetti, MD;  Location: [facility];  Service: Orthopedics;  Laterality: Left;     Social History   reports that he has been smoking cigarettes. He has never used smokeless tobacco. He reports current alcohol use. He reports that he does not use drugs.   Family History     His family history includes Diabetes in an other family member.   Allergies No Known Allergies   Home Medications  Prior to Admission medications   Medication Sig Start Date End Date Taking? Authorizing Provider  aspirin EC 81 MG EC tablet Take 1 tablet (81 mg total) by mouth daily. 08/01/18  Yes Daune Perch, NP  furosemide (LASIX) 40 MG tablet Take 2 tablets (80 mg total) by mouth 2 (two) times daily. 04/19/19  Yes Lelon Perla, MD  insulin aspart protamine - aspart (NOVOLOG MIX 70/30 FLEXPEN) (70-30) 100 UNIT/ML FlexPen Inject 0.15 mLs (15 Units total) into the skin 2 (two) times daily with a meal. Patient taking differently: Inject 20 Units into the skin 2 (two) times daily as needed (high blood sugar).  07/01/18  Yes Angiulli, Lavon Paganini, PA-C  metolazone (ZAROXOLYN) 2.5 MG tablet Take 2.5 mg by mouth once a week.  10/05/19  Yes [provider]  acetaminophen (TYLENOL) 325 MG tablet Take 1-2 tablets (325-650 mg total) by mouth every 6 (six) hours as needed for mild pain (pain score 1-3 or temp > 100.5). 07/01/18   Angiulli, Lavon Paganini, PA-C  ALPRAZolam Duanne Moron) 0.25 MG tablet Take 1 tablet (0.25 mg total) by mouth 3 (three) times daily as needed for anxiety. Patient not taking: Reported on 11/22/2019 08/04/18   Valarie Merino, MD  atorvastatin (LIPITOR) 80 MG tablet Take 1 tablet (80 mg total) by mouth daily. Patient not taking: Reported on 11/22/2019 04/28/19   Erlene Quan, PA-C  blood glucose meter kit and supplies Dispense based on patient and insurance preference. Use up to four times daily as directed. (FOR ICD-9 250.00, 250.01). 07/01/18   Angiulli, Lavon Paganini, PA-C  DULoxetine (CYMBALTA) 30 MG capsule Take 1 capsule (30 mg total) by mouth daily. Patient not taking: Reported  on 11/22/2019 09/11/18   [name], MD  ezetimibe (ZETIA) 10 MG tablet Take 1 tablet (10 mg total) by mouth daily. 05/03/19 08/01/19  Lelon Perla, MD  hydrALAZINE (APRESOLINE) 50 MG tablet Take  1.5 tablets (75 mg total) by mouth every 8 (eight) hours. 01/05/19   Hilty, Nadean Corwin, MD  Insulin Pen Needle 31G X 5 MM MISC Use daily to inject insulin as instructed 07/01/18   Angiulli, Lavon Paganini, PA-C  isosorbide mononitrate (IMDUR) 30 MG 24 hr tablet Take 1 tablet (30 mg total) by mouth daily. Patient not taking: Reported on 11/22/2019 08/01/18   Daune Perch, NP  metoprolol succinate (TOPROL-XL) 50 MG 24 hr tablet Take 1 tablet (50 mg total) by mouth daily. Take with or immediately following a meal. 08/01/18   Daune Perch, NP  Multiple Vitamin (MULTIVITAMIN WITH MINERALS) TABS tablet Take 1 tablet by mouth daily. 08/01/18   Daune Perch, NP  sitaGLIPtin-metformin (JANUMET) 50-1000 MG tablet Take 1 tablet by mouth daily. 12/24/18   Rai, Vernelle Emerald, MD  ticagrelor (BRILINTA) 90 MG TABS tablet Take 90 mg by mouth 2 (two) times daily.    [provider]    Critical care time:     Marianna Payment, D.O. Date 11/22/2019 Time 9:37 AM [facility] Internal Medicine, PGY-1 Pager: [phone]

## 2019-11-22 NOTE — ED Notes (Signed)
Transported to xray 

## 2019-11-22 NOTE — ED Provider Notes (Signed)
[facility] EMERGENCY DEPARTMENT Provider Note   CSN: [id number] Arrival date & time: 11/21/19  2033    History Chief Complaint  Patient presents with  . Nasal Congestion    Edward [name] is a 49 y.o. male.  The history is provided by the patient.  He has history of hypertension, diabetes, combined systolic and diastolic heart failure, coronary artery disease, peripheral vascular disease with right below the knee amputation and left midfoot amputation, chronic kidney disease and comes in complaining of nasal and throat congestion which is chronic, but got worse today.  He also noted that his glucose was over 500 earlier today, but he was able to bring it down to 158.  He has not noticed decreased urine output today.  He denies fever, chills, sweats.  He has noted that he is somewhat fatigued today but he denies chest pain or dyspnea or cough or fever.  He has received the first dose of COVID-19 vaccine and denies exposure to anyone with COVID-19.  Past Medical History:  Diagnosis Date  . Anxiety    self reported  . Arthritis   . CHF (congestive heart failure) ([place])   . Coronary artery disease   . Depression    self reported  . Diabetes mellitus   . Hypertension     Patient Active Problem List   Diagnosis Date Noted  . Elevated troponin 12/22/2018  . Acute exacerbation of CHF (congestive heart failure) ([place]) 12/21/2018  . Gait abnormality 09/11/2018  . CAD S/P percutaneous coronary angioplasty 08/06/2018  . Cardiomyopathy ([place]) 08/06/2018  . PVD (peripheral vascular disease) ([place]) 08/06/2018  . Shortness of breath   . Chronic combined systolic and diastolic CHF (congestive heart failure) ([place])   . History of NSTEMI 07/28/2018  . Respiratory failure with hypoxia ([place]) 07/28/2018  . Acute heart failure ([place]) 07/28/2018  . Hypertensive emergency 07/28/2018  . S/P BKA (below knee amputation) unilateral, right ([place]) 07/15/2018  . Hypoglycemia   . Anxiety  about health   . Labile blood glucose   . Essential hypertension   . History of transmetatarsal amputation of left foot ([place])   . Hypoalbuminemia due to protein-calorie malnutrition ([place])   . Stage 3 chronic kidney disease   . Poorly controlled type 2 diabetes mellitus with peripheral neuropathy ([place])   . Acute blood loss anemia   . Anemia of chronic disease   . Leukocytosis   . Tobacco abuse   . Hyperkalemia 06/20/2018  . Diabetic polyneuropathy associated with type 2 diabetes mellitus ([place])   . Severe protein-calorie malnutrition ([place])   . AKI (acute kidney injury) ([place]) 06/17/2018  . Normocytic anemia 06/17/2018  . Viral pharyngitis 01/14/2017  . Cigarette smoker 01/14/2017  . Avascular necrosis of hip, left ([place]) 10/11/2016  . Status post left hip replacement 10/11/2016  . S/P transmetatarsal amputation of foot, left ([place]) 07/25/2016  . Balance problem 07/25/2016  . Fournier's gangrene 08/16/2011  . Acute respiratory failure ([place]) 08/16/2011  . Septic shock(785.52) 08/16/2011  . Diabetes mellitus with complication, with long-term current use of insulin ([place]) 08/16/2011    Past Surgical History:  Procedure Laterality Date  . AMPUTATION Left 05/10/2016   Procedure: Left Transmetatarsal Amputation;  Surgeon: [name], MD;  Location: [place];  Service: Orthopedics;  Laterality: Left;  . AMPUTATION Right 05/16/2018   Procedure: PARTIAL RAY AMPUTATION FIFTH TOE RIGHT FOOT;  Surgeon: [name], DPM;  Location: [place];  Service: Podiatry;  Laterality: Right;  .  AMPUTATION Right 06/17/2018   Procedure: AMPUTATION 4th RAY Right Foot;  Surgeon: Trula Slade, DPM;  Location: [place];  Service: Podiatry;  Laterality: Right;  . AMPUTATION Right 06/19/2018   Procedure: RIGHT BELOW KNEE AMPUTATION;  Surgeon: [name], MD;  Location: [place];  Service: Orthopedics;  Laterality: Right;  . APPLICATION OF WOUND VAC Right 06/19/2018   Procedure: APPLICATION OF WOUND VAC;  Surgeon:  [name], MD;  Location: [place];  Service: Orthopedics;  Laterality: Right;  . CARDIAC CATHETERIZATION  07/29/2018  . CIRCUMCISION  09/02/2011   Procedure: CIRCUMCISION ADULT;  Surgeon: Molli Hazard, MD;  Location: [place];  Service: Urology;  Laterality: N/A;  . CORONARY STENT INTERVENTION N/A 07/29/2018   Procedure: CORONARY STENT INTERVENTION;  Surgeon: Leonie Man, MD;  Location: [place] [facility];  Service: Cardiovascular;  Laterality: N/A;  . I & D EXTREMITY  09/02/2011   Procedure: IRRIGATION AND DEBRIDEMENT EXTREMITY;  Surgeon: Theodoro Kos, DO;  Location: [place];  Service: Plastics;  Laterality: N/A;  . INCISION AND DRAINAGE OF WOUND  08/12/2011   Procedure: IRRIGATION AND DEBRIDEMENT WOUND;  Surgeon: Zenovia Jarred, MD;  Location: [place];  Service: General;;  perineum  . IRRIGATION AND DEBRIDEMENT ABSCESS  08/12/2011   Procedure: IRRIGATION AND DEBRIDEMENT ABSCESS;  Surgeon: Molli Hazard, MD;  Location: [place];  Service: Urology;  Laterality: N/A;  irrigation and debridment perineum    . IRRIGATION AND DEBRIDEMENT ABSCESS  08/14/2011   Procedure: MINOR INCISION AND DRAINAGE OF ABSCESS;  Surgeon: Molli Hazard, MD;  Location: [place];  Service: Urology;  Laterality: N/A;  Perineal Wound Debridement;Placement Bilateral Testicular Thigh Pouches  . LEFT HEART CATH AND CORONARY ANGIOGRAPHY N/A 07/29/2018   Procedure: LEFT HEART CATH AND CORONARY ANGIOGRAPHY;  Surgeon: Leonie Man, MD;  Location: [place] [facility];  Service: Cardiovascular;  Laterality: N/A;  . TOTAL HIP ARTHROPLASTY Left 10/11/2016   Procedure: LEFT TOTAL HIP ARTHROPLASTY ANTERIOR APPROACH;  Surgeon: Mcarthur Rossetti, MD;  Location: [facility];  Service: Orthopedics;  Laterality: Left;       Family History  Problem Relation Age of Onset  . Diabetes Other     Social History   Tobacco Use  . Smoking status: Current Every Day Smoker     Types: Cigarettes  . Smokeless tobacco: Never Used  Substance Use Topics  . Alcohol use: Yes    Alcohol/week: 0.0 standard drinks    Comment: has not had alcohol in a month  . Drug use: No    Home Medications Prior to Admission medications   Medication Sig Start Date End Date Taking? Authorizing Provider  acetaminophen (TYLENOL) 325 MG tablet Take 1-2 tablets (325-650 mg total) by mouth every 6 (six) hours as needed for mild pain (pain score 1-3 or temp > 100.5). 07/01/18   Angiulli, Lavon Paganini, PA-C  ALPRAZolam Duanne Moron) 0.25 MG tablet Take 1 tablet (0.25 mg total) by mouth 3 (three) times daily as needed for anxiety. 08/04/18   Valarie Merino, MD  aspirin EC 81 MG EC tablet Take 1 tablet (81 mg total) by mouth daily. 08/01/18   Daune Perch, NP  atorvastatin (LIPITOR) 80 MG tablet Take 1 tablet (80 mg total) by mouth daily. 04/28/19   Erlene Quan, PA-C  blood glucose meter kit and supplies Dispense based on patient and insurance preference. Use up to four times daily as directed. (FOR ICD-9 250.00, 250.01). 07/01/18  Angiulli, Lavon Paganini, PA-C  DULoxetine (CYMBALTA) 30 MG capsule Take 1 capsule (30 mg total) by mouth daily. 09/11/18   [name], MD  ezetimibe (ZETIA) 10 MG tablet Take 1 tablet (10 mg total) by mouth daily. 05/03/19 08/01/19  Lelon Perla, MD  furosemide (LASIX) 40 MG tablet Take 2 tablets (80 mg total) by mouth 2 (two) times daily. 04/19/19   Lelon Perla, MD  hydrALAZINE (APRESOLINE) 50 MG tablet Take 1.5 tablets (75 mg total) by mouth every 8 (eight) hours. 01/05/19   Hilty, Nadean Corwin, MD  insulin aspart protamine - aspart (NOVOLOG MIX 70/30 FLEXPEN) (70-30) 100 UNIT/ML FlexPen Inject 0.15 mLs (15 Units total) into the skin 2 (two) times daily with a meal. Patient taking differently: Inject 15 Units into the skin 2 (two) times daily as needed (high blood sugar).  07/01/18   Angiulli, Lavon Paganini, PA-C  Insulin Pen Needle 31G X 5 MM MISC Use daily to inject  insulin as instructed 07/01/18   Angiulli, Lavon Paganini, PA-C  isosorbide mononitrate (IMDUR) 30 MG 24 hr tablet Take 1 tablet (30 mg total) by mouth daily. 08/01/18   Daune Perch, NP  metolazone (ZAROXOLYN) 2.5 MG tablet Take 2.5 mg by mouth every morning. 10/05/19   [provider]  metoprolol succinate (TOPROL-XL) 50 MG 24 hr tablet Take 1 tablet (50 mg total) by mouth daily. Take with or immediately following a meal. 08/01/18   Daune Perch, NP  Multiple Vitamin (MULTIVITAMIN WITH MINERALS) TABS tablet Take 1 tablet by mouth daily. 08/01/18   Daune Perch, NP  sitaGLIPtin-metformin (JANUMET) 50-1000 MG tablet Take 1 tablet by mouth daily. 12/24/18   Rai, Vernelle Emerald, MD  ticagrelor (BRILINTA) 90 MG TABS tablet Take 90 mg by mouth 2 (two) times daily.    [provider]    Allergies    Patient has no known allergies.  Review of Systems   Review of Systems  All other systems reviewed and are negative.   Physical Exam Updated Vital Signs BP 120/89 (BP Location: Left Arm)   Pulse (!) 42   Temp 98.6 F (37 C) (Oral)   Resp (!) 24   Ht 6' 1"  (1.854 m)   Wt 113.4 kg   SpO2 99%   BMI 32.98 kg/m   Physical Exam Vitals and nursing note reviewed.   49 year old male, resting comfortably and in no acute distress. Vital signs are significant for slow heart rate and slightly elevated respiratory rate. Oxygen saturation is 99%, which is normal. Head is normocephalic and atraumatic. PERRLA, EOMI. Oropharynx is clear. Neck is nontender and supple without adenopathy or JVD. Back is nontender and there is no CVA tenderness. Lungs are clear without rales, wheezes, or rhonchi. Chest is nontender. Heart has an irregular rhythm without murmur. Abdomen is soft, flat, nontender without masses or hepatosplenomegaly and peristalsis is normoactive. Extremities have trace edema.  Status post right below the knee amputation and left midfoot amputation. Skin is warm and dry without  rash. Neurologic: Mental status is normal, cranial nerves are intact, there are no motor or sensory deficits.   ED Results / Procedures / Treatments   Labs (all labs ordered are listed, but only abnormal results are displayed) Labs Reviewed  COMPREHENSIVE METABOLIC PANEL - Abnormal; Notable for the following components:      Result Value   Potassium 6.9 (*)    CO2 19 (*)    Glucose, Bld 144 (*)    BUN 62 (*)  Creatinine, Ser 5.32 (*)    Calcium 8.3 (*)    Total Protein 6.3 (*)    Albumin 2.3 (*)    AST 111 (*)    ALT 75 (*)    GFR calc non Af Amer 12 (*)    GFR calc Af Amer 13 (*)    All other components within normal limits  CBC WITH DIFFERENTIAL/PLATELET - Abnormal; Notable for the following components:   Abs Immature Granulocytes 0.09 (*)    All other components within normal limits  BRAIN NATRIURETIC PEPTIDE - Abnormal; Notable for the following components:   B Natriuretic Peptide 241.7 (*)    All other components within normal limits  CBG MONITORING, ED - Abnormal; Notable for the following components:   Glucose-Capillary 136 (*)    All other components within normal limits  CBG MONITORING, ED - Abnormal; Notable for the following components:   Glucose-Capillary 159 (*)    All other components within normal limits  TROPONIN I (HIGH SENSITIVITY) - Abnormal; Notable for the following components:   Troponin I (High Sensitivity) 90 (*)    All other components within normal limits  TROPONIN I (HIGH SENSITIVITY) - Abnormal; Notable for the following components:   Troponin I (High Sensitivity) 89 (*)    All other components within normal limits  RESPIRATORY PANEL BY RT PCR (FLU A&B, COVID)  MAGNESIUM  TSH  T4  BASIC METABOLIC PANEL  POC SARS CORONAVIRUS 2 AG -  ED    EKG EKG Interpretation  Date/Time:  Monday November 22 2019 02:19:06 EDT Ventricular Rate:  59 PR Interval:    QRS Duration: 115 QT Interval:  470 QTC Calculation: 398 R Axis:   -65 Text  Interpretation: Atrial fibrillation Left anterior fascicular block Baseline wander in lead(s) V3 V4 When compared with ECG of 12/21/2018, Left anterior fasicular block is now present Atrial fibrillation is now present Confirmed by Delora Fuel ([id number]) on 11/22/2019 3:13:43 AM   Radiology DG Chest 2 View  Result Date: 11/22/2019 CLINICAL DATA:  Shortness of breath EXAM: CHEST - 2 VIEW COMPARISON:  12/21/2018 FINDINGS: Cardiomegaly with symmetric interstitial and airspace opacities. There is fissure thickening and small pleural effusions. The pattern is similar but less extensive than on prior. IMPRESSION: CHF pattern. Electronically Signed   By: Monte Fantasia M.D.   On: 11/22/2019 03:57    Procedures Procedures  CRITICAL CARE Performed by: Delora Fuel Total critical care time: 100 minutes Critical care time was exclusive of separately billable procedures and treating other patients. Critical care was necessary to treat or prevent imminent or life-threatening deterioration. Critical care was time spent personally by me on the following activities: development of treatment plan with patient and/or surrogate as well as nursing, discussions with consultants, evaluation of patient's response to treatment, examination of patient, obtaining history from patient or surrogate, ordering and performing treatments and interventions, ordering and review of laboratory studies, ordering and review of radiographic studies, pulse oximetry and re-evaluation of patient's condition.  Medications Ordered in ED Medications  apixaban (ELIQUIS) tablet 5 mg (5 mg Oral Given 11/22/19 0602)  albuterol (PROVENTIL) (2.5 MG/3ML) 0.083% nebulizer solution 10 mg (has no administration in time range)  furosemide (LASIX) 160 mg in dextrose 5 % 50 mL IVPB (has no administration in time range)  insulin aspart (novoLOG) injection 5 Units (5 Units Intravenous Given 11/22/19 0726)    And  dextrose 50 % solution 50 mL (50 mLs Intravenous  Given 11/22/19 0724)  sodium bicarbonate injection 50  mEq (50 mEq Intravenous Given 11/22/19 0726)  sodium zirconium cyclosilicate (LOKELMA) packet 10 g (10 g Oral Given 11/22/19 5436)    ED Course  I have reviewed the triage vital signs and the nursing notes.  Pertinent labs & imaging results that were available during my care of the patient were reviewed by me and considered in my medical decision making (see chart for details).  Complaints of nasal congestion with no significant findings on physical exam.  However, irregular heartbeat is noted and ECG shows atrial fibrillation which is new for the patient.  This may account for his fatigue and symptoms today, but he states he is not aware of any change in his heartbeat so it is unclear how long atrial fibrillation has been present.  Therefore, he is not a candidate for immediate cardioversion.  He is started on apixaban for anticoagulation (CHA2DS2-VASc score of 4).  Will check screening labs.  Old records are reviewed confirming outpatient management of combined systolic and diastolic heart failure.  Labs show acute on chronic renal failure with severe hyperkalemia.  Last creatinine was 2.60 on September 11, is 5.32 today with potassium 6.9.  Mild elevation of transaminases also noted of uncertain significance.  Magnesium is normal.  Chest x-ray is significant for findings suggestive of CHF, but BNP is only mildly elevated and probably not significant in the setting of renal failure.  Troponin is mildly elevated, probably secondary to renal failure but delta troponin is pending.  He is given emergent treatment for hyperkalemia including intravenous calcium, serum bicarbonate, glucose, insulin and a nebulized albuterol.  Case has been discussed with Dr. Genevive Bi of critical care service agrees to admit the patient.  Also, case discussed with Dr. Jonnie Finner of nephrology service to make arrangements for emergent dialysis.  MDM Rules/Calculators/A&P This  patients CHA2DS2-VASc Score and unadjusted Ischemic Stroke Rate (% per year) is equal to 4.8 % stroke rate/year from a score of 4  Above score calculated as 1 point each if present [CHF, HTN, DM, Vascular=MI/PAD/Aortic Plaque, Age if 65-74, or Male] Above score calculated as 2 points each if present [Age > 75, or Stroke/TIA/TE]    Final Clinical Impression(s) / ED Diagnoses Final diagnoses:  Acute renal failure with acute tubular necrosis superimposed on stage 3 chronic kidney disease, unspecified whether stage 3a or 3b CKD (HCC)  Hyperkalemia  Atrial fibrillation, unspecified type ([place])  Elevated transaminase level  Elevated troponin    Rx / DC Orders ED Discharge Orders    None       Delora Fuel, MD 06/77/03 [phone]

## 2019-11-22 NOTE — ED Notes (Signed)
Pt complains of SOB. SPO2 is at 100% when I rechecked vitals. Pt also complained of feeling tingling in his arms and hands as well as numb.

## 2019-11-22 NOTE — Consult Note (Addendum)
[facility] NOTE   MRN : [id number]  Reason for Consult: ESRD AKI on CKD Referring Physician: Dr. Jonnie Finner  History of Present Illness: 49 y/o male presented to the ED with syncopal episodes.  He is followed by Dr. Hollie Salk as an out patient and described a 2 month history of  fatigue, anorexia and nausea /vomiting including bad taste in mouth, dry heaves and vomiting after meals.  Past medical history: DM2 on insulin, R BKA and LTMA. Atrial fibrillation, new onset  -we will correct electrolytes, EKG on review could be junctional with PVCs, DC apixaban.      Current Facility-Administered Medications  Medication Dose Route Frequency Provider Last Rate Last Admin  . acetaminophen (TYLENOL) tablet 650 mg  650 mg Oral Q4H PRN Helberg, Justin, MD      . albuterol (PROVENTIL) (2.5 MG/3ML) 0.083% nebulizer solution 10 mg  10 mg Nebulization Once Helberg, Larkin Ina, MD      . Chlorhexidine Gluconate Cloth 2 % PADS 6 each  6 each Topical Q0600 Ina Homes, MD   6 each at 11/22/19 1155  . heparin 1000 UNIT/ML injection           . heparin injection 2,400 Units  2.4 mL Intracatheter Once Ina Homes, MD      . heparin injection 5,000 Units  5,000 Units Subcutaneous Q8H Helberg, Justin, MD      . insulin aspart (novoLOG) injection 0-9 Units  0-9 Units Subcutaneous TID WC Ina Homes, MD   1 Units at 11/22/19 1153  . ondansetron (ZOFRAN) injection 4 mg  4 mg Intravenous Q6H PRN Helberg, Justin, MD      . pantoprazole (PROTONIX) EC tablet 40 mg  40 mg Oral Daily Helberg, Justin, MD        Pt meds include: Statin :Yes Betablocker: Yes ASA: Yes Other anticoagulants/antiplatelets: Heparin   Past Medical History:  Diagnosis Date  . Anxiety    self reported  . Arthritis   . CHF (congestive heart failure) ([place])   . Coronary artery disease   . Depression    self reported  . Diabetes mellitus   . Hypertension     Past Surgical History:  Procedure  Laterality Date  . AMPUTATION Left 05/10/2016   Procedure: Left Transmetatarsal Amputation;  Surgeon: [name], MD;  Location: [place];  Service: Orthopedics;  Laterality: Left;  . AMPUTATION Right 05/16/2018   Procedure: PARTIAL RAY AMPUTATION FIFTH TOE RIGHT FOOT;  Surgeon: [name], DPM;  Location: [place];  Service: Podiatry;  Laterality: Right;  . AMPUTATION Right 06/17/2018   Procedure: AMPUTATION 4th RAY Right Foot;  Surgeon: Trula Slade, DPM;  Location: [place];  Service: Podiatry;  Laterality: Right;  . AMPUTATION Right 06/19/2018   Procedure: RIGHT BELOW KNEE AMPUTATION;  Surgeon: [name], MD;  Location: [place];  Service: Orthopedics;  Laterality: Right;  . APPLICATION OF WOUND VAC Right 06/19/2018   Procedure: APPLICATION OF WOUND VAC;  Surgeon: [name], MD;  Location: [place];  Service: Orthopedics;  Laterality: Right;  . CARDIAC CATHETERIZATION  07/29/2018  . CIRCUMCISION  09/02/2011   Procedure: CIRCUMCISION ADULT;  Surgeon: Molli Hazard, MD;  Location: [place];  Service: Urology;  Laterality: N/A;  . CORONARY STENT INTERVENTION N/A 07/29/2018   Procedure: CORONARY STENT INTERVENTION;  Surgeon: Leonie Man, MD;  Location: [place] [facility];  Service: Cardiovascular;  Laterality: N/A;  . I & D EXTREMITY  09/02/2011   Procedure: IRRIGATION AND DEBRIDEMENT EXTREMITY;  Surgeon: Theodoro Kos, DO;  Location: [place];  Service: Plastics;  Laterality: N/A;  . INCISION AND DRAINAGE OF WOUND  08/12/2011   Procedure: IRRIGATION AND DEBRIDEMENT WOUND;  Surgeon: Zenovia Jarred, MD;  Location: [place];  Service: General;;  perineum  . IRRIGATION AND DEBRIDEMENT ABSCESS  08/12/2011   Procedure: IRRIGATION AND DEBRIDEMENT ABSCESS;  Surgeon: Molli Hazard, MD;  Location: [place];  Service: Urology;  Laterality: N/A;  irrigation and debridment perineum    . IRRIGATION AND DEBRIDEMENT ABSCESS  08/14/2011   Procedure: MINOR  INCISION AND DRAINAGE OF ABSCESS;  Surgeon: Molli Hazard, MD;  Location: [place];  Service: Urology;  Laterality: N/A;  Perineal Wound Debridement;Placement Bilateral Testicular Thigh Pouches  . LEFT HEART CATH AND CORONARY ANGIOGRAPHY N/A 07/29/2018   Procedure: LEFT HEART CATH AND CORONARY ANGIOGRAPHY;  Surgeon: Leonie Man, MD;  Location: [place] [facility];  Service: Cardiovascular;  Laterality: N/A;  . TOTAL HIP ARTHROPLASTY Left 10/11/2016   Procedure: LEFT TOTAL HIP ARTHROPLASTY ANTERIOR APPROACH;  Surgeon: Mcarthur Rossetti, MD;  Location: [facility];  Service: Orthopedics;  Laterality: Left;    Social History Social History   Tobacco Use  . Smoking status: Current Every Day Smoker    Types: Cigarettes  . Smokeless tobacco: Never Used  Substance Use Topics  . Alcohol use: Yes    Alcohol/week: 0.0 standard drinks    Comment: has not had alcohol in a month  . Drug use: No    Family History Family History  Problem Relation Age of Onset  . Diabetes Other     No Known Allergies   REVIEW OF SYSTEMS  General: [ ]  Weight loss, [ ]  Fever, [ ]  chills Neurologic: [ ]  Dizziness, [ ]  Blackouts, [ ]  Seizure [ ]  Stroke, [ ]  "Mini stroke", [ ]  Slurred speech, [ ]  Temporary blindness; [ ]  weakness in arms or legs, [ ]  Hoarseness [ ]  Dysphagia Cardiac: [ ]  Chest pain/pressure, [ ]  Shortness of breath at rest [ ]  Shortness of breath with exertion, [ ]  Atrial fibrillation or irregular heartbeat  Vascular: [ ]  Pain in legs with walking, [ ]  Pain in legs at rest, [ ]  Pain in legs at night,  [ ]  Non-healing ulcer, [ ]  Blood clot in vein/DVT,   Pulmonary: [ ]  Home oxygen, [ ]  Productive cough, [ ]  Coughing up blood, [ ]  Asthma,  [ ]  Wheezing [ ]  COPD Musculoskeletal:  [ ]  Arthritis, [ ]  Low back pain, [ ]  Joint pain Hematologic: [ ]  Easy Bruising, [ ]  Anemia; [ ]  Hepatitis Gastrointestinal: [ ]  Blood in stool, [ ]  Gastroesophageal Reflux/heartburn, Urinary: [ ]  chronic Kidney  disease, [ ]  on HD - [ ]  MWF or [ ]  TTHS, [ ]  Burning with urination, [ ]  Difficulty urinating Skin: [ ]  Rashes, [ ]  Wounds Psychological: [ ]  Anxiety, [ ]  Depression  Physical Examination Vitals:   11/22/19 1100 11/22/19 1135 11/22/19 1220 11/22/19 1231  BP:  129/87  (!) 145/84  Pulse:  (!) 56 (!) 53 (!) 55  Resp: (!) 27 (!) 24 (!) 22 17  Temp:  98.6 F (37 C) 97.9 F (36.6 C)   TempSrc:  Oral Oral   SpO2:  99% 96% 97%  Weight:  121.8 kg    Height:  6\' 1"  (1.854 m)     Body mass index is 35.43 kg/m.  General:  WDWN  in NAD HENT: WNL Eyes: Pupils equal Pulmonary: normal non-labored breathing , without Rales, rhonchi,  wheezing Cardiac: irregularly irregular, without  Murmurs, rubs or gallops; No carotid bruits Abdomen: soft, NT, no masses Skin: no rashes, ulcers noted;  no Gangrene , no cellulitis; no open wounds;   Vascular Exam/Pulses:palpable radial and brachial pulses B   Musculoskeletal: no muscle wasting or atrophy Neurologic: A&O X 3; Appropriate Affect ;  SENSATION: normal; MOTOR FUNCTION: 5/5 Symmetric Speech is fluent/normal   Significant Diagnostic Studies: CBC Lab Results  Component Value Date   WBC 8.5 11/22/2019   HGB 13.0 11/22/2019   HCT 41.5 11/22/2019   MCV 85.6 11/22/2019   PLT 247 11/22/2019    BMET    Component Value Date/Time   NA 135 11/22/2019 0808   NA 140 04/30/2019 1507   K 6.3 (HH) 11/22/2019 0808   CL 105 11/22/2019 0808   CO2 18 (L) 11/22/2019 0808   GLUCOSE 164 (H) 11/22/2019 0808   BUN 65 (H) 11/22/2019 0808   BUN 37 (H) 04/30/2019 1507   CREATININE 5.21 (H) 11/22/2019 0808   CREATININE 1.70 (H) 06/15/2018 1458   CALCIUM 8.1 (L) 11/22/2019 0808   GFRNONAA 12 (L) 11/22/2019 0808   GFRAA 14 (L) 11/22/2019 1779   Estimated Creatinine Clearance: 23.2 mL/min (A) (by C-G formula based on SCr of 5.21 mg/dL (H)).  COAG Lab Results  Component Value Date   INR 1.04 08/04/2018   INR 1.01 05/15/2018   INR 1.46 08/12/2011      Non-Invasive Vascular Imaging:  Pending ordered today  ASSESSMENT/PLAN:  AKI on CKD currently on HD Hyperkalemia being corrected, talking in full sentences without SOB.   He is right hand dominant.    Roxy Horseman 11/22/2019 12:59 PM   I have seen and evaluated the patient. I agree with the PA note as documented above.  49 year old male with multiple medical comorbidities including diabetes, stage IV CKD, CHF, coronary disease, hypertension who presented with syncopal episode.  Subsequently has been evaluated by nephrology and found to have severe hyperkalemia and have recommended initiation of hemodialysis.  He is currently in the CCU and has had a temporary catheter placed for temporary dialysis.  I discussed plan for tunneled dialysis catheter with likely left arm AV fistula versus graft since he is right-handed.  Patient is under the impression that his renal function is going to improve and they are just going to watch how he does for now.  He is not interested in surgery at this time.  I have discussed with Dr. Burnett Sheng who will follow up with the patient again to further discuss.  Vascular will follow.  He needs vein mapping which has been ordered.  May start heparin in ICU for new onset afib as well.  Marty Heck, MD [facility] [place] Office: [phone]

## 2019-11-22 NOTE — ED Notes (Signed)
Pharmacy called about eliquis not having been tubed, pharmacy to tube med

## 2019-11-22 NOTE — Consult Note (Signed)
Renal Service Consult Note [facility]  Edward [name] 11/22/2019 Sol [place]azing Requesting Physician: Dr Roxanne Mins    Reason for Consult:  Renal failure HPI: The patient is a 49 y.o. year-old with hx of DM2, PAD, CKD IV, combined CHF, CAD, HTN presented to ED this am for syncopal episode.  In ED EKG shows bradycardia w/ junctional rhythm vs afib.  BP's okay. Serum K+ 6.9.  Given IV insulin/ glucose and lokelma.  Creat 5, baseline 2.5.  Asked to see for renal failure.   Pt f/b Dr Hollie Salk, last visit in Nov 2020.  Pt describes 2 mos hx of fatigue, anorexia and nausea /vomiting including bad taste in mouth, dry heaves and vomiting after meals.  +more recent SOB/ DOE and orthopnea, sleeping on mult pillows w/ HOB up.    LOng hx DM2 on insulin, R BKA and LTMA.     ROS  denies CP  no joint pain   no HA  no blurry vision  no rash  no diarrhea  no nausea/ vomiting  no dysuria  no difficulty voiding  no change in urine color    Past Medical History  Past Medical History:  Diagnosis Date  . Anxiety    self reported  . Arthritis   . CHF (congestive heart failure) ([place])   . Coronary artery disease   . Depression    self reported  . Diabetes mellitus   . Hypertension    Past Surgical History  Past Surgical History:  Procedure Laterality Date  . AMPUTATION Left 05/10/2016   Procedure: Left Transmetatarsal Amputation;  Surgeon: [name], MD;  Location: [place];  Service: Orthopedics;  Laterality: Left;  . AMPUTATION Right 05/16/2018   Procedure: PARTIAL RAY AMPUTATION FIFTH TOE RIGHT FOOT;  Surgeon: [name], DPM;  Location: [place];  Service: Podiatry;  Laterality: Right;  . AMPUTATION Right 06/17/2018   Procedure: AMPUTATION 4th RAY Right Foot;  Surgeon: Trula Slade, DPM;  Location: [place];  Service: Podiatry;  Laterality: Right;  . AMPUTATION Right 06/19/2018   Procedure: RIGHT BELOW KNEE AMPUTATION;  Surgeon: [name], MD;  Location: [place];   Service: Orthopedics;  Laterality: Right;  . APPLICATION OF WOUND VAC Right 06/19/2018   Procedure: APPLICATION OF WOUND VAC;  Surgeon: [name], MD;  Location: [place];  Service: Orthopedics;  Laterality: Right;  . CARDIAC CATHETERIZATION  07/29/2018  . CIRCUMCISION  09/02/2011   Procedure: CIRCUMCISION ADULT;  Surgeon: Molli Hazard, MD;  Location: [place];  Service: Urology;  Laterality: N/A;  . CORONARY STENT INTERVENTION N/A 07/29/2018   Procedure: CORONARY STENT INTERVENTION;  Surgeon: Leonie Man, MD;  Location: [place] [facility];  Service: Cardiovascular;  Laterality: N/A;  . I & D EXTREMITY  09/02/2011   Procedure: IRRIGATION AND DEBRIDEMENT EXTREMITY;  Surgeon: Theodoro Kos, DO;  Location: [place];  Service: Plastics;  Laterality: N/A;  . INCISION AND DRAINAGE OF WOUND  08/12/2011   Procedure: IRRIGATION AND DEBRIDEMENT WOUND;  Surgeon: Zenovia Jarred, MD;  Location: [place];  Service: General;;  perineum  . IRRIGATION AND DEBRIDEMENT ABSCESS  08/12/2011   Procedure: IRRIGATION AND DEBRIDEMENT ABSCESS;  Surgeon: Molli Hazard, MD;  Location: [place];  Service: Urology;  Laterality: N/A;  irrigation and debridment perineum    . IRRIGATION AND DEBRIDEMENT ABSCESS  08/14/2011   Procedure: MINOR INCISION AND DRAINAGE OF ABSCESS;  Surgeon: Molli Hazard, MD;  [facility];  Service: Urology;  Laterality: N/A;  Perineal Wound Debridement;Placement Bilateral Testicular Thigh Pouches  . LEFT HEART CATH AND CORONARY ANGIOGRAPHY N/A 07/29/2018   Procedure: LEFT HEART CATH AND CORONARY ANGIOGRAPHY;  Surgeon: Leonie Man, MD;  Location: [place] [facility];  Service: Cardiovascular;  Laterality: N/A;  . TOTAL HIP ARTHROPLASTY Left 10/11/2016   Procedure: LEFT TOTAL HIP ARTHROPLASTY ANTERIOR APPROACH;  Surgeon: Mcarthur Rossetti, MD;  [facility];  Service: Orthopedics;  Laterality: Left;   Family History  Family  History  Problem Relation Age of Onset  . Diabetes Other    Social History  reports that he has been smoking cigarettes. He has never used smokeless tobacco. He reports current alcohol use. He reports that he does not use drugs. Allergies No Known Allergies Home medications Prior to Admission medications   Medication Sig Start Date End Date Taking? Authorizing Provider  acetaminophen (TYLENOL) 325 MG tablet Take 1-2 tablets (325-650 mg total) by mouth every 6 (six) hours as needed for mild pain (pain score 1-3 or temp > 100.5). 07/01/18  Yes Angiulli, Lavon Paganini, PA-C  aspirin EC 81 MG EC tablet Take 1 tablet (81 mg total) by mouth daily. 08/01/18  Yes Daune Perch, NP  furosemide (LASIX) 40 MG tablet Take 2 tablets (80 mg total) by mouth 2 (two) times daily. 04/19/19  Yes Lelon Perla, MD  hydrALAZINE (APRESOLINE) 50 MG tablet Take 1.5 tablets (75 mg total) by mouth every 8 (eight) hours. 01/05/19  Yes Hilty, Nadean Corwin, MD  insulin aspart protamine - aspart (NOVOLOG MIX 70/30 FLEXPEN) (70-30) 100 UNIT/ML FlexPen Inject 0.15 mLs (15 Units total) into the skin 2 (two) times daily with a meal. Patient taking differently: Inject 20 Units into the skin 2 (two) times daily as needed (high blood sugar).  07/01/18  Yes Angiulli, Lavon Paganini, PA-C  LANTUS SOLOSTAR 100 UNIT/ML Solostar Pen Inject 20 Units into the skin 2 (two) times daily. 06/08/19  Yes [provider]  metolazone (ZAROXOLYN) 2.5 MG tablet Take 2.5 mg by mouth once a week.  10/05/19  Yes [provider]  Multiple Vitamin (MULTIVITAMIN WITH MINERALS) TABS tablet Take 1 tablet by mouth daily. 08/01/18  Yes Daune Perch, NP  ticagrelor (BRILINTA) 90 MG TABS tablet Take 90 mg by mouth 2 (two) times daily.   Yes [provider]  ALPRAZolam (XANAX) 0.25 MG tablet Take 1 tablet (0.25 mg total) by mouth 3 (three) times daily as needed for anxiety. Patient not taking: Reported on 11/22/2019 08/04/18   Valarie Merino,  MD  atorvastatin (LIPITOR) 80 MG tablet Take 1 tablet (80 mg total) by mouth daily. Patient not taking: Reported on 11/22/2019 04/28/19   Erlene Quan, PA-C  blood glucose meter kit and supplies Dispense based on patient and insurance preference. Use up to four times daily as directed. (FOR ICD-9 250.00, 250.01). 07/01/18   Angiulli, Lavon Paganini, PA-C  DULoxetine (CYMBALTA) 30 MG capsule Take 1 capsule (30 mg total) by mouth daily. Patient not taking: Reported on 11/22/2019 09/11/18   [name], MD  ezetimibe (ZETIA) 10 MG tablet Take 1 tablet (10 mg total) by mouth daily. 05/03/19 08/01/19  Lelon Perla, MD  Insulin Pen Needle 31G X 5 MM MISC Use daily to inject insulin as instructed 07/01/18   Angiulli, Lavon Paganini, PA-C  isosorbide mononitrate (IMDUR) 30 MG 24 hr tablet Take 1 tablet (30 mg total) by mouth daily. Patient not taking: Reported  on 11/22/2019 08/01/18   Daune Perch, NP  metoprolol succinate (TOPROL-XL) 50 MG 24 hr tablet Take 1 tablet (50 mg total) by mouth daily. Take with or immediately following a meal. Patient not taking: Reported on 11/22/2019 08/01/18   Daune Perch, NP  sitaGLIPtin-metformin (JANUMET) 50-1000 MG tablet Take 1 tablet by mouth daily. Patient not taking: Reported on 11/22/2019 12/24/18   Rai, Vernelle Emerald, MD     Vitals:   11/22/19 0315 11/22/19 0700 11/22/19 0715 11/22/19 0728  BP: 120/89 (!) 152/87 (!) 142/93   Pulse:    (!) 58  Resp: (!) 24 10 20    Temp:      TempSrc:      SpO2: 99% 99%  96%  Weight:      Height:       Exam Gen alert, nasal O2, no distress No rash, cyanosis or gangrene Sclera anicteric, throat clear  No jvd or bruits Chest clear bilat to bases no rales, wheezing or bronchial BS RRR no MRG Abd soft ntnd no mass or ascites +bs GU normal male MS R BKA, L TMA,  no joint effusions or deformity Ext 1-2+ L pretib edema, no hip or UE edema Neuro is alert, Ox 3 , nf, no asterixis    Home meds:  - lasix 80 bid/ hydralazine 75  tid/ zaroxolyn 2.5 weekly/ metoprolol xl 50  - janumet 50-1000 qd/ insulin lantus 20u bid/ 70-30 insulin 20u bid  - brilinta 90 bid/ aspirin 81/ lipitor 80/ imdur 30 qd/ zetia 10  - xanax 0.25 tid prn  - prn's/ vitamins/ supplements    CXR - mild pulm edema     EKG - brady HR 40- 50's, no P waves, junctional vs afib    Assessment/ Plan: 1. Syncope 2. Hyperkalemia, severe - sp IV ins/ glu in ED. Will add IV Ca, then HD.  3. Renal failure - baseline creat 2.5 last yr, creat here 5.2 w/ uremic symptoms x >1-2 months. Recommend HD initiation. Have d/w CCM, will need temp cath given unstable heart rhythm so will have 1st HD in ICU today. Then can transition to [facility] when stable.  Start CLIP process.  4. Bradycardia - poss afib vs other 5. HTN 6. DM2 w/ complications 7. PAD - w/ R BKA and L TMA    Rob Durelle Zepeda  MD 11/22/2019, 9:15 AM  Recent Labs  Lab 11/22/19 0411  WBC 8.5  HGB 13.0   Recent Labs  Lab 11/22/19 0411  K 6.9*  BUN 62*  CREATININE 5.32*  CALCIUM 8.3*

## 2019-11-22 NOTE — Procedures (Signed)
Hemodialysis Insertion Procedure Note KINSLEY NICKLAUS [id number] [birth year]  Procedure: Insertion of Hemodialysis Catheter Type: 3 port  Indications: Hemodialysis   Procedure Details Consent: Risks of procedure as well as the alternatives and risks of each were explained to the (patient/caregiver).  Consent for procedure obtained. Time Out: Verified patient identification, verified procedure, site/side was marked, verified correct patient position, special equipment/implants available, medications/allergies/relevent history reviewed, required imaging and test results available.  Performed  Maximum sterile technique was used including antiseptics, cap, gloves, gown, hand hygiene, mask and sheet. Skin prep: Chlorhexidine; local anesthetic administered A antimicrobial bonded/coated triple lumen catheter was placed in the right internal jugular vein using the Seldinger technique. Ultrasound guidance used.Yes.   Catheter placed to 16 cm. Blood aspirated via all 3 ports and then flushed x 3. Line sutured x 2 and dressing applied.  Evaluation Blood flow good Complications: No apparent complications Patient did tolerate procedure well. Chest X-ray ordered to verify placement.  CXR: pending.  Ina Homes, MD IMTS PGY3  Pager: [phone]

## 2019-11-22 NOTE — H&P (Deleted)
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 He was found to be hypervolemic with acute on chronic renal failure with hyperkalemia. Nephrology was consulted for possible HD and PCCM was consulted for possible admission.   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 For the last several weeks he has had a significanbt amount of trouble with mucus drainage that causes him to be [facility] and nauseous. Yesterday he became extremely nauseous, cold, and diaphoretic. He subsequently syncopized. His son subsequently called EMS and he was brought to the ED  Over the last several months he has had issues with a bitter taste in his mouth, progressive DOE (but feels his mobility is more limited by his right BKA), + orthopnea, pruritis, and diffuse weakness. He has not urinated since the AM of 4/4. He knew that he had issues with his kidneys and has had conversations about HD.   Past Medical History   Past Medical History:  Diagnosis Date  . Anxiety    self reported  . Arthritis   . CHF (congestive heart failure) ([place])   . Coronary artery disease   . Depression    self reported  . Diabetes mellitus   . Hypertension    Significant Hospital Events   4/5: Admitted to hospital with acute on chronic renal failure  Consults:  Nephrology  Procedures:  04/05 Rt .CVC>  Significant Diagnostic Tests:  4/5 CXR >> Bilateral pulmonary edema, small bilateral pleural effusions, enlarged cardiac silhouette   Micro Data:  SARs COVID >> Pending  Antimicrobials:  N/A  Interim  history/subjective:  See above  Objective   Blood pressure (!) 142/93, pulse (!) 58, temperature 98.6 F (37 C), temperature source Oral, resp. rate 20, height 6' 1"  (1.854 m), weight 113.4 kg, SpO2 96 %.       No intake or output data in the 24 hours ending 11/22/19 0759 Filed Weights   11/21/19 2046  Weight: 113.4 kg   Examination: Physical Exam  Constitutional: He is oriented to person, place, and time. No distress.  HENT:  Head: Normocephalic and atraumatic.  Eyes: EOM are normal.  Neck: No JVD present.  Cardiovascular: An irregular rhythm present. Bradycardia present.  No murmur heard. Pulmonary/Chest: Effort normal. He has no wheezes. He has no rales.  Decreased breath sounds  Abdominal: Soft. He exhibits no distension. There is no abdominal tenderness.  Musculoskeletal:        General: Edema (1-2+ pitting edema to the knees) present. No tenderness. Normal range of motion.     Cervical back: Normal range of motion.  Neurological: He is alert and oriented to person, place, and time.  Skin: Skin is warm. No rash noted. He is diaphoretic.    Resolved Hospital Problem list   N/A  Assessment & Plan:   Acute on CKD with new ESRD: Hyperkalemia: Patient presents with acutely worsening kidney function with electrolyte abnormalities and signs of uremia.  Spoke to nephrology who tentatively plan to dialyze the patient.  - Received  albuterol, insulin, Lokelma  - Will start 160 mg of furosemide IV - Repeat BMP - Nephrology consulted for recommendations regarding HD - Will place a temporary Rt. IJ CVC  Syncope: Patient presented with a single episode of syncope yesterday evening. States that he did have prodromal symptoms consisting of diaphoresis and cold sensation. Patient remembers "eyes going dark" and could see for a few seconds. Patient does have a history of CAD with DES in the Cx in 2019 and his most recent echo (12/2018) shows EF of 45-50% with mildly dilated LV and  severely dilated right LA. Moderate MVR. Mild aortic valve thickness Patient presented to the ED with an EKG concerning for AF and was started on apixaban for 1 dose. Upon review of the EKG, the patient was bradycardic in the 50's with regularly, irregular rhythm with bigeminy pattern. He has not had any episodes of hypotension. Patient clinically appears hypervolemic. Orthostatic labs have not been performed. Patient is neurovascularly intact. - Will likely need repeat of echocardiogram. - Telemetry - Will be admitted to ICU for monitoring.  HFmrEF  CAD s/p DES to Cx in 2019 (EF 40-45%) Patient has signs symptoms concerning for mild hypervolemia including lower extremity edema and symmetric interstitial airspace opacities and small pleural effusions.  Patient does admit to some chest pain this morning in the ED, but is not currently having chest pain.  Change in troponin was 90>89 and BNP was 241.7.  Patient does admit to shortness of breath with exertion and orthopnea.  He admits to intermittent medication adherence secondary to large pill burden. -Furosemide 160 mg IV -Strict ins and outs, daily weights -Fluid and salt restriction  Atrial fibrillation: Patient had an EKG in the ED concerning for atrial fibrillation.  CHA2DS2-VASc score was calculated and determined before and patient was started on apixaban 5 mg.  He received his first dose this morning at 6 AM. On review of the EKG, the the rhythm appears to be regular early regular with a bigeminy pattern. I will discontinue apixaban now. Patient has a past history of anemia, but not currently anemic. He does have a history of OSA, not on CPAP. There is no overt signs of EKG changing secondary to hyperkalemia. -Discontinue apixaban - TSH - CPAP at night prn - Continue telemetry   Hypertension: -Hold antihypertensive medications until we have decided the patient needs hemodialysis.  Type 2 diabetes mellitus: Patient presented with  hyperglycemia greater than 500.  Patient was given 5 units IV NovoLog in the ED with improvement of his blood glucose to 159.  We will start CBG monitoring and give sliding scale insulin as needed.  Patient takes 20 units Lantus twice daily and 15 units of NovoLog with meals. -CBG monitoring QID daily. -Sliding scale renal/sensitive. (will likely need monitor CBG and adjust insulin accordingly) - Hgb A1c pending  Transaminase elevation: Hepatocellular pattern of labs likely secondary to metabolic associated liver disease given his history of diabetes and obesity. Denies IV drug use or risky sexual history. Not currently sexually active. - Screen for HCV and HBV  - Monitor CMP  Best practice:  Diet: NPO Pain/Anxiety/Delirium protocol (if indicated): n/a VAP protocol (if indicated): n/a DVT prophylaxis: heparin  GI prophylaxis: PPI  Glucose control: SSI  Mobility: bedrest  Code Status: Full code  Family Communication: none yea, but lives with two sons in [place] Disposition:   Labs   CBC: Recent Labs  Lab 11/22/19 0411  WBC 8.5  NEUTROABS 6.0  HGB 13.0  HCT 41.5  MCV 85.6  PLT 102    Basic Metabolic Panel: Recent Labs  Lab 11/22/19 0411  NA 135  K 6.9*  CL 103  CO2 19*  GLUCOSE 144*  BUN 62*  CREATININE 5.32*  CALCIUM 8.3*  MG 2.0   GFR: Estimated Creatinine Clearance: 21.9 mL/min (A) (by C-G formula based on SCr of 5.32 mg/dL (H)). Recent Labs  Lab 11/22/19 0411  WBC 8.5    Liver Function Tests: Recent Labs  Lab 11/22/19 0411  AST 111*  ALT 75*  ALKPHOS 63  BILITOT 0.7  PROT 6.3*  ALBUMIN 2.3*   No results for input(s): LIPASE, AMYLASE in the last 168 hours. No results for input(s): AMMONIA in the last 168 hours.  ABG    Component Value Date/Time   PHART 7.403 08/14/2011 0557   PCO2ART 30.4 (L) 08/14/2011 0557   PO2ART 73.0 (L) 08/14/2011 0557   HCO3 19.0 (L) 08/14/2011 0557   TCO2 25 05/04/2014 1007   ACIDBASEDEF 5.0 (H) 08/14/2011  0557   O2SAT 95.0 08/14/2011 0557     Coagulation Profile: No results for input(s): INR, PROTIME in the last 168 hours.  Cardiac Enzymes: No results for input(s): CKTOTAL, CKMB, CKMBINDEX, TROPONINI in the last 168 hours.  HbA1C: Hgb A1c MFr Bld  Date/Time Value Ref Range Status  12/22/2018 12:24 AM 7.5 (H) 4.8 - 5.6 % Final    Comment:    (NOTE) Pre diabetes:          5.7%-6.4% Diabetes:              >6.4% Glycemic control for   <7.0% adults with diabetes   07/29/2018 02:32 AM 6.7 (H) 4.8 - 5.6 % Final    Comment:    (NOTE) Pre diabetes:          5.7%-6.4% Diabetes:              >6.4% Glycemic control for   <7.0% adults with diabetes     CBG: Recent Labs  Lab 11/22/19 0404  GLUCAP 136*    Review of Systems:   Chest pain, shortness of breath, nausea, vomiting, LE edema, pruritis.   Past Medical History  He,  has a past medical history of Anxiety, Arthritis, CHF (congestive heart failure) ([place]), Coronary artery disease, Depression, Diabetes mellitus, and Hypertension.   Surgical History    Past Surgical History:  Procedure Laterality Date  . AMPUTATION Left 05/10/2016   Procedure: Left Transmetatarsal Amputation;  Surgeon: [name], MD;  Location: [place];  Service: Orthopedics;  Laterality: Left;  . AMPUTATION Right 05/16/2018   Procedure: PARTIAL RAY AMPUTATION FIFTH TOE RIGHT FOOT;  Surgeon: [name], DPM;  Location: [place];  Service: Podiatry;  Laterality: Right;  . AMPUTATION Right 06/17/2018   Procedure: AMPUTATION 4th RAY Right Foot;  Surgeon: Trula Slade, DPM;  Location: [place];  Service: Podiatry;  Laterality: Right;  . AMPUTATION Right 06/19/2018   Procedure: RIGHT BELOW KNEE AMPUTATION;  Surgeon: [name], MD;  Location: [place];  Service: Orthopedics;  Laterality: Right;  . APPLICATION OF WOUND VAC Right 06/19/2018   Procedure: APPLICATION OF WOUND VAC;  Surgeon: [name], MD;  Location: [place];  Service: Orthopedics;  Laterality:  Right;  . CARDIAC CATHETERIZATION  07/29/2018  . CIRCUMCISION  09/02/2011   Procedure: CIRCUMCISION ADULT;  Surgeon: Molli Hazard, MD;  Location: [place];  Service: Urology;  Laterality: N/A;  . CORONARY STENT INTERVENTION  N/A 07/29/2018   Procedure: CORONARY STENT INTERVENTION;  Surgeon: Leonie Man, MD;  Location: [facility];  Service: Cardiovascular;  Laterality: N/A;  . I & D EXTREMITY  09/02/2011   Procedure: IRRIGATION AND DEBRIDEMENT EXTREMITY;  Surgeon: Theodoro Kos, DO;  Location: [place];  Service: Plastics;  Laterality: N/A;  . INCISION AND DRAINAGE OF WOUND  08/12/2011   Procedure: IRRIGATION AND DEBRIDEMENT WOUND;  Surgeon: Zenovia Jarred, MD;  Location: [place];  Service: General;;  perineum  . IRRIGATION AND DEBRIDEMENT ABSCESS  08/12/2011   Procedure: IRRIGATION AND DEBRIDEMENT ABSCESS;  Surgeon: Molli Hazard, MD;  Location: [place];  Service: Urology;  Laterality: N/A;  irrigation and debridment perineum    . IRRIGATION AND DEBRIDEMENT ABSCESS  08/14/2011   Procedure: MINOR INCISION AND DRAINAGE OF ABSCESS;  Surgeon: Molli Hazard, MD;  Location: [place];  Service: Urology;  Laterality: N/A;  Perineal Wound Debridement;Placement Bilateral Testicular Thigh Pouches  . LEFT HEART CATH AND CORONARY ANGIOGRAPHY N/A 07/29/2018   Procedure: LEFT HEART CATH AND CORONARY ANGIOGRAPHY;  Surgeon: Leonie Man, MD;  Location: [place] [facility];  Service: Cardiovascular;  Laterality: N/A;  . TOTAL HIP ARTHROPLASTY Left 10/11/2016   Procedure: LEFT TOTAL HIP ARTHROPLASTY ANTERIOR APPROACH;  Surgeon: Mcarthur Rossetti, MD;  Location: [facility];  Service: Orthopedics;  Laterality: Left;     Social History   reports that he has been smoking cigarettes. He has never used smokeless tobacco. He reports current alcohol use. He reports that he does not use drugs.   Family History   His family history includes Diabetes in an  other family member.   Allergies No Known Allergies   Home Medications  Prior to Admission medications   Medication Sig Start Date End Date Taking? Authorizing Provider  aspirin EC 81 MG EC tablet Take 1 tablet (81 mg total) by mouth daily. 08/01/18  Yes Daune Perch, NP  furosemide (LASIX) 40 MG tablet Take 2 tablets (80 mg total) by mouth 2 (two) times daily. 04/19/19  Yes Lelon Perla, MD  insulin aspart protamine - aspart (NOVOLOG MIX 70/30 FLEXPEN) (70-30) 100 UNIT/ML FlexPen Inject 0.15 mLs (15 Units total) into the skin 2 (two) times daily with a meal. Patient taking differently: Inject 20 Units into the skin 2 (two) times daily as needed (high blood sugar).  07/01/18  Yes Angiulli, Lavon Paganini, PA-C  metolazone (ZAROXOLYN) 2.5 MG tablet Take 2.5 mg by mouth once a week.  10/05/19  Yes [provider]  acetaminophen (TYLENOL) 325 MG tablet Take 1-2 tablets (325-650 mg total) by mouth every 6 (six) hours as needed for mild pain (pain score 1-3 or temp > 100.5). 07/01/18   Angiulli, Lavon Paganini, PA-C  ALPRAZolam Duanne Moron) 0.25 MG tablet Take 1 tablet (0.25 mg total) by mouth 3 (three) times daily as needed for anxiety. Patient not taking: Reported on 11/22/2019 08/04/18   Valarie Merino, MD  atorvastatin (LIPITOR) 80 MG tablet Take 1 tablet (80 mg total) by mouth daily. Patient not taking: Reported on 11/22/2019 04/28/19   Erlene Quan, PA-C  blood glucose meter kit and supplies Dispense based on patient and insurance preference. Use up to four times daily as directed. (FOR ICD-9 250.00, 250.01). 07/01/18   Angiulli, Lavon Paganini, PA-C  DULoxetine (CYMBALTA) 30 MG capsule Take 1 capsule (30 mg total) by mouth daily. Patient not taking: Reported on 11/22/2019 09/11/18   [name], MD  ezetimibe (ZETIA) 10 MG tablet Take 1 tablet (10 mg total) by mouth daily. 05/03/19 08/01/19  Lelon Perla, MD  hydrALAZINE (APRESOLINE) 50 MG tablet Take 1.5 tablets (75 mg total) by mouth every 8  (eight) hours. 01/05/19   Hilty, Nadean Corwin, MD  Insulin Pen Needle 31G X 5 MM MISC Use daily to inject insulin as instructed 07/01/18   Angiulli, Lavon Paganini, PA-C  isosorbide mononitrate (IMDUR) 30 MG 24 hr tablet Take 1 tablet (30 mg total) by mouth daily. Patient not taking: Reported on 11/22/2019 08/01/18   Daune Perch, NP  metoprolol succinate (TOPROL-XL) 50 MG 24 hr tablet Take 1 tablet (50 mg total) by mouth daily. Take with or immediately following a meal. 08/01/18   Daune Perch, NP  Multiple Vitamin (MULTIVITAMIN WITH MINERALS) TABS tablet Take 1 tablet by mouth daily. 08/01/18   Daune Perch, NP  sitaGLIPtin-metformin (JANUMET) 50-1000 MG tablet Take 1 tablet by mouth daily. 12/24/18   Rai, Vernelle Emerald, MD  ticagrelor (BRILINTA) 90 MG TABS tablet Take 90 mg by mouth 2 (two) times daily.    [provider]    Critical care time:      Marianna Payment, D.O. Date 11/22/2019 Time 9:38 AM [facility] Internal Medicine, PGY-1 Pager: [phone]

## 2019-11-23 ENCOUNTER — Inpatient Hospital Stay (HOSPITAL_COMMUNITY): Payer: Medicare Other

## 2019-11-23 DIAGNOSIS — N186 End stage renal disease: Secondary | ICD-10-CM

## 2019-11-23 DIAGNOSIS — E8770 Fluid overload, unspecified: Secondary | ICD-10-CM

## 2019-11-23 DIAGNOSIS — I10 Essential (primary) hypertension: Secondary | ICD-10-CM

## 2019-11-23 DIAGNOSIS — N19 Unspecified kidney failure: Secondary | ICD-10-CM

## 2019-11-23 DIAGNOSIS — N179 Acute kidney failure, unspecified: Secondary | ICD-10-CM

## 2019-11-23 DIAGNOSIS — E1100 Type 2 diabetes mellitus with hyperosmolarity without nonketotic hyperglycemic-hyperosmolar coma (NKHHC): Secondary | ICD-10-CM

## 2019-11-23 DIAGNOSIS — N189 Chronic kidney disease, unspecified: Secondary | ICD-10-CM

## 2019-11-23 DIAGNOSIS — E875 Hyperkalemia: Principal | ICD-10-CM

## 2019-11-23 DIAGNOSIS — R55 Syncope and collapse: Secondary | ICD-10-CM

## 2019-11-23 DIAGNOSIS — I340 Nonrheumatic mitral (valve) insufficiency: Secondary | ICD-10-CM

## 2019-11-23 DIAGNOSIS — I351 Nonrheumatic aortic (valve) insufficiency: Secondary | ICD-10-CM

## 2019-11-23 DIAGNOSIS — E1165 Type 2 diabetes mellitus with hyperglycemia: Secondary | ICD-10-CM

## 2019-11-23 LAB — BASIC METABOLIC PANEL
Anion gap: 15 (ref 5–15)
BUN: 48 mg/dL — ABNORMAL HIGH (ref 6–20)
CO2: 22 mmol/L (ref 22–32)
Calcium: 8.2 mg/dL — ABNORMAL LOW (ref 8.9–10.3)
Chloride: 99 mmol/L (ref 98–111)
Creatinine, Ser: 4.45 mg/dL — ABNORMAL HIGH (ref 0.61–1.24)
GFR calc Af Amer: 17 mL/min — ABNORMAL LOW (ref >60)
GFR calc non Af Amer: 14 mL/min — ABNORMAL LOW (ref >60)
Glucose, Bld: 133 mg/dL — ABNORMAL HIGH (ref 70–99)
Potassium: 4.5 mmol/L (ref 3.5–5.1)
Sodium: 136 mmol/L (ref 135–145)

## 2019-11-23 LAB — ECHOCARDIOGRAM COMPLETE
Height: 73 in
Weight: 3904.79 oz

## 2019-11-23 LAB — CBC
HCT: 41.4 % (ref 39.0–52.0)
Hemoglobin: 13.1 g/dL (ref 13.0–17.0)
MCH: 26.5 pg (ref 26.0–34.0)
MCHC: 31.6 g/dL (ref 30.0–36.0)
MCV: 83.8 fL (ref 80.0–100.0)
Platelets: 211 10*3/uL (ref 150–400)
RBC: 4.94 MIL/uL (ref 4.22–5.81)
RDW: 14 % (ref 11.5–15.5)
WBC: 7.3 10*3/uL (ref 4.0–10.5)
nRBC: 0 % (ref 0.0–0.2)

## 2019-11-23 LAB — GLUCOSE, CAPILLARY
Glucose-Capillary: 125 mg/dL — ABNORMAL HIGH (ref 70–99)
Glucose-Capillary: 145 mg/dL — ABNORMAL HIGH (ref 70–99)
Glucose-Capillary: 250 mg/dL — ABNORMAL HIGH (ref 70–99)

## 2019-11-23 LAB — T4: T4, Total: 7.8 ug/dL (ref 4.5–12.0)

## 2019-11-23 MED ORDER — METOPROLOL TARTRATE 12.5 MG HALF TABLET
12.5000 mg | ORAL_TABLET | Freq: Two times a day (BID) | ORAL | Status: DC
  Administered 2019-11-23 – 2019-11-25 (×4): 12.5 mg via ORAL
  Filled 2019-11-23 (×4): qty 1

## 2019-11-23 MED ORDER — HYDRALAZINE HCL 25 MG PO TABS
25.0000 mg | ORAL_TABLET | Freq: Three times a day (TID) | ORAL | Status: DC
  Administered 2019-11-23 – 2019-11-25 (×3): 25 mg via ORAL
  Filled 2019-11-23 (×5): qty 1

## 2019-11-23 MED ORDER — CHLORHEXIDINE GLUCONATE CLOTH 2 % EX PADS
6.0000 | MEDICATED_PAD | Freq: Every day | CUTANEOUS | Status: DC
  Administered 2019-11-23 – 2019-11-24 (×2): 6 via TOPICAL

## 2019-11-23 NOTE — Progress Notes (Signed)
[facility] Progress Note  Subjective: 2 L off w HD yest, K+ down 4.5 today.    Vitals:   11/23/19 0633 11/23/19 0700 11/23/19 0800 11/23/19 0900  BP: 135/81 125/80 97/73 118/75  Pulse:  (!) 39 97 97  Resp:  16 16 (!) 21  Temp:  98.2 F (36.8 C)    TempSrc:  Oral    SpO2:  97% 98% 97%  Weight:      Height:        Exam: Gen alert and feels better today No jvd or bruits Chest clear bilat to bases RRR no MRG Abd soft ntnd no mass or ascites +bs  MS R BKA, L TMA Ext 1-2+ L pretib edema Neuro is alert, Ox 3 , nf, no asterixis    Home meds:  - lasix 80 bid/ hydralazine 75 tid/ zaroxolyn 2.5 weekly/ metoprolol xl 50  - janumet 50-1000 qd/ insulin lantus 20u bid/ 70-30 insulin 20u bid  - brilinta 90 bid/ aspirin 81/ lipitor 80/ imdur 30 qd/ zetia 10  - xanax 0.25 tid prn  - prn's/ vitamins/ supplements    CXR - mild pulm edema     EKG - brady HR 40- 50's, no P waves, junctional vs afib    Assessment/ Plan: 1. Syncope - none here 2. Hyperkalemia - w/ EKG changes, bradycardia.  Resolved w HD. 3. Renal failure - new esrd patient. Baseline creat 2.5 last yr, creat here 5.2 w/ uremic symptoms x 2 mos. Recommended HD initiation. Had HD yest, feeling better, K+ down and HR up. Pt agreeable to AVF/ TDC per [facility]. HD tomorrow.  4. Bradycardia - resolved 5. Afib ?  - per primary team 6. HTN - getting hydral/ metoprolol, BP's a bit too low will lower BP meds to allow for vol removal w HD 7. DM2 w/ complications 8. PAD - w/ R BKA and L TMA     Rob Maybree Riling 11/23/2019, 9:17 AM   Recent Labs  Lab 11/22/19 0411 11/22/19 0411 11/22/19 0808 11/23/19 0313  K 6.9*   < > 6.3* 4.5  BUN 62*   < > 65* 48*  CREATININE 5.32*   < > 5.21* 4.45*  CALCIUM 8.3*   < > 8.1* 8.2*  HGB 13.0  --   --  13.1   < > = values in this interval not displayed.   Inpatient medications: . Chlorhexidine Gluconate Cloth  6 each Topical Q0600  . heparin  5,000 Units Subcutaneous  Q8H  . hydrALAZINE  75 mg Oral Q8H  . insulin aspart  0-9 Units Subcutaneous TID WC  . mouth rinse  15 mL Mouth Rinse BID  . metoprolol succinate  50 mg Oral Daily  . pantoprazole  40 mg Oral Daily  . ticagrelor  90 mg Oral BID    acetaminophen, ALPRAZolam, ondansetron (ZOFRAN) IV

## 2019-11-23 NOTE — Progress Notes (Signed)
Upper extremity vein mapping has been completed.   Preliminary results in CV Proc.   Abram Sander 11/23/2019 3:24 PM

## 2019-11-23 NOTE — Progress Notes (Signed)
  Echocardiogram 2D Echocardiogram has been performed.  Edward [name] 11/23/2019, 9:54 AM

## 2019-11-23 NOTE — Progress Notes (Signed)
49 year old male seen for dialysis access and tunneled dialysis catheter placement yesterday.  Patient wanted to further discuss options with nephrology and thought his kidneys may recover.  I did discuss with Dr. Jonnie Finner after consult and he will follow-up today.  I discussed this with him again today at bedside.  I will get him scheduled for Thursday for exchange of his temporary catheter for [facility] in the right IJ and also for placement of a left arm AV fistula versus graft.  Vein mapping is pending.  Marty Heck, MD [facility] Office: [place]

## 2019-11-23 NOTE — Progress Notes (Signed)
PROGRESS NOTE  Edward [name] AYT:[id number] DOB: 1971/06/01 DOA: 11/21/2019 PCP: [name], MD  Brief History   The patient is a 49 yr old man who presented to [facility] ED with complaints of shortness of breath, nausea, diaphoresis, and syncope x 1. He had not urinated in 4 days, and was found to be in acute renal failure, hyperkalemia, and uremic. He was also volume overloaded. A temporary dialysis catheter was placed, and the patient was dialyzed emergently in the ICU on 11/22/2019. The patient was also found to be in new onset atrial fibrillation. The patient was restarted on the hydralazine, metoprolol, and Brillinta after dialysis. Vascular surgery was consulted to place a tunneled catheter and placement of a left AV fistula or graft. This will be done on 11/25/2019.   The patient was started on apixaban by the ED.   Echocardiogram was performed on 11/23/2019. EF was 40-45% with mildly decreased function and regional wall motion abnormalities. There is concentric left ventricular hypertrophy. There is severe hypokinesis of the left ventricular, entire apical segment. There was indeterminate diastolic filling due to E-A fusion. RV systolic function is normal.  Left atrium was moderately dilated.   EKG demonstrated NSR with 1st degree AV Block, Age indeterminate anterior infarct, left Axis deviation, abnormal EKG.   The patient was transferred out of the ICU on 11/23/2019 to the hospitalist service.   Consultants  . Nephrology . Vascular Surgery . PCCM  Procedures  . Placement of temporary dialysis catheter. . Hemodialysis  Antibiotics   Anti-infectives (From admission, onward)   None    .  Subjective  The patient is resting comfortably. No new complaints.  Objective   Vitals:  Vitals:   11/23/19 1100 11/23/19 1200  BP: 93/66 102/72  Pulse: 96 98  Resp: 16 16  Temp: 99.2 F (37.3 C)   SpO2: 98% 95%   Exam:  Constitutional:  . The patient is awake, alert, and oriented x 3. No  acute distress. Respiratory:  . No increased work of breathing. . No wheezes, rales, or rhonchi . No tactile fremitus Cardiovascular:  . Regular rate and rhythm . No murmurs, ectopy, or gallups. . No lateral PMI. No thrills. Abdomen:  . Abdomen is soft, non-tender, non-distended . No hernias, masses, or organomegaly . Normoactive bowel sounds.  Musculoskeletal:  . No cyanosis, clubbing, or edema Skin:  . No rashes, lesions, ulcers . palpation of skin: no induration or nodules Neurologic:  . CN 2-12 intact . Sensation all 4 extremities intact Psychiatric:  . Mental status o Mood, affect appropriate o Orientation to person, place, time  . judgment and insight appear intact  I have personally reviewed the following:   Today's Data  . Vitals, CBC, BMP  Micro Data  . MRSA by PCR  Cardiology Data  . EKG . Echocardiogram  Scheduled Meds: . Chlorhexidine Gluconate Cloth  6 each Topical Q0600  . Chlorhexidine Gluconate Cloth  6 each Topical Q0600  . heparin  5,000 Units Subcutaneous Q8H  . hydrALAZINE  25 mg Oral Q8H  . insulin aspart  0-9 Units Subcutaneous TID WC  . mouth rinse  15 mL Mouth Rinse BID  . metoprolol tartrate  12.5 mg Oral BID  . pantoprazole  40 mg Oral Daily  . ticagrelor  90 mg Oral BID   Continuous Infusions:  Active Problems:   Acute renal failure (ARF) (HCC)   LOS: 1 day   A & P  Acute on CKD with new ESRD with  Hyperkalemia: Patient presents with acutely worsening kidney function with electrolyte abnormalities and signs of uremia. Nephrology was consulted. A temporary dialysis catheter was placed and the patient underwent emergent HD in the ICU on 11/22/2019. He was also given lokelma and lasix 160 mg IV. He was also given insulin.   Syncope: Patient presented with a single episode of syncope on the evening of 11/21/2019. States that he did have prodromal symptoms consisting of diaphoresis and cold sensation. Patient remembers "eyes going dark"  and could see for a few seconds. Patient does have a history of CAD with DES in the Cx in 2019 and his most recent echo (12/2018) shows EF of 45-50% with mildly dilated LV and severely dilated right LA. Moderate MVR. Mild aortic valve thickness.  Patient presented to the ED with an EKG concerning for AF and was started on apixaban for 1 dose. Upon review of the EKG, the patient was bradycardic in the 50's with regularly, irregular rhythm with bigeminy pattern. He has not had any episodes of hypotension. Patient clinically appears hypervolemic. Orthostatic labs have not been performed. Patient is neurovascularly intact. He was admitted to the ICU and then transferred to a telemetry bed on the floor.   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  Patient does admit to some chest pain this morning in the ED, but is not currently having chest pain.  Change in troponin was 90>89 and BNP was 241.7.  Patient does admit to shortness of breath with exertion and orthopnea.  He admits to intermittent medication adherence secondary to large pill burden. Volume status will be carefully monitored. Fluid and Na will be restricted. Volume will largely be managed through dialysis.  Atrial fibrillation:Patient had an EKG in the ED concerning for atrial fibrillation.  CHA2DS2-VASc score was calculated and determined before and patient was started on apixaban 5 mg.  He received his first dose this morning at 6 AM. On review of the EKG, the the rhythm appears to be regular early regular with a bigeminy pattern. I will discontinue apixaban now. Patient has a past history of anemia, but not currently anemic. He does have a history of OSA, not on CPAP. There is no overt signs of EKG changing secondary to hyperkalemia. He will be given CPAP at night as needed for  OSA. He will be monitored on telemetry. TSH is normal.    Hypertension: Blood pressures are low/normal on hydralazine 25 mg tid and metoprolol 12.5 bid. Monitor. These may be able to be decreased once volume is under good control with HD.  Type 2 diabetes mellitus: Patient presented with hyperglycemia greater than 500. HbA1c was 14.7. Glucoses appear to be well controlled on FSBS and SSI. He is also on a carbohydrate modified diet and hypoglycemic protocol.  Transaminase elevation: Hepatocellular pattern of labs likely secondary to metabolic associated liver disease given his history of diabetes and obesity. Denies IV drug use or risky sexual history. Not currently sexually active. Screen for HCV and HBV. Monitor CMP.  I have seen and examined this patient myself. I have spent 36 minutes in his evaluation and care.  DVT prophylaxis: heparin  Code Status: Full code  Family Communication: none yea, but lives with two sons in [place] Disposition:    [place], DO [facility] Direct contact: see [email or web address]  7PM-7AM contact night coverage as above  11/23/2019, 1:56 PM  LOS: 1 day

## 2019-11-24 DIAGNOSIS — I480 Paroxysmal atrial fibrillation: Secondary | ICD-10-CM

## 2019-11-24 DIAGNOSIS — L97929 Non-pressure chronic ulcer of unspecified part of left lower leg with unspecified severity: Secondary | ICD-10-CM

## 2019-11-24 DIAGNOSIS — R0601 Orthopnea: Secondary | ICD-10-CM

## 2019-11-24 LAB — SURGICAL PCR SCREEN
MRSA, PCR: NEGATIVE
Staphylococcus aureus: NEGATIVE

## 2019-11-24 LAB — BASIC METABOLIC PANEL
Anion gap: 11 (ref 5–15)
BUN: 54 mg/dL — ABNORMAL HIGH (ref 6–20)
CO2: 22 mmol/L (ref 22–32)
Calcium: 7.4 mg/dL — ABNORMAL LOW (ref 8.9–10.3)
Chloride: 102 mmol/L (ref 98–111)
Creatinine, Ser: 4.75 mg/dL — ABNORMAL HIGH (ref 0.61–1.24)
GFR calc Af Amer: 15 mL/min — ABNORMAL LOW (ref >60)
GFR calc non Af Amer: 13 mL/min — ABNORMAL LOW (ref >60)
Glucose, Bld: 218 mg/dL — ABNORMAL HIGH (ref 70–99)
Potassium: 4.6 mmol/L (ref 3.5–5.1)
Sodium: 135 mmol/L (ref 135–145)

## 2019-11-24 LAB — GLUCOSE, CAPILLARY
Glucose-Capillary: 217 mg/dL — ABNORMAL HIGH (ref 70–99)
Glucose-Capillary: 218 mg/dL — ABNORMAL HIGH (ref 70–99)
Glucose-Capillary: 181 mg/dL — ABNORMAL HIGH (ref 70–99)
Glucose-Capillary: 184 mg/dL — ABNORMAL HIGH (ref 70–99)
Glucose-Capillary: 241 mg/dL — ABNORMAL HIGH (ref 70–99)

## 2019-11-24 NOTE — Plan of Care (Signed)
  Problem: Education: Goal: Knowledge of disease and its progression will improve Outcome: Progressing   Problem: Education: Goal: Knowledge of General Education information will improve Description: Including pain rating scale, medication(s)/side effects and non-pharmacologic comfort measures Outcome: Progressing

## 2019-11-24 NOTE — Progress Notes (Signed)
New Admission Note:   Arrival Method: bed transferred from [facility] Mental Orientation: A&Ox4 Telemetry: 5M18, CCMD notified Assessment: Completed Skin: L LE ulcer, R BKA IV: LFA, SL Pain: 0/10 Tubes: None Safety Measures: Safety Fall Prevention Plan has been discussed  Admission: to be completed 5 [place] Orientation: Patient has been orientated to the room, unit and staff.   Family: none at bedside  Orders to be reviewed and implemented. Will continue to monitor the patient. Call light has been placed within reach and bed alarm has been activated.

## 2019-11-24 NOTE — Progress Notes (Signed)
PROGRESS NOTE  Edward [name] UKG:[id number] DOB: Aug 24, 1970 DOA: 11/21/2019 PCP: [name], MD  Brief History   The patient is a 49 yr old man who presented to [facility] ED with complaints of shortness of breath, nausea, diaphoresis, and syncope x 1. He had not urinated in 4 days, and was found to be in acute renal failure, hyperkalemia, and uremic. He was also volume overloaded. A temporary dialysis catheter was placed, and the patient was dialyzed emergently in the ICU on 11/22/2019. The patient was also found to be in new onset atrial fibrillation. The patient was restarted on the hydralazine, metoprolol, and Brillinta after dialysis. Vascular surgery was consulted to place a tunneled catheter and placement of a left AV fistula or graft. This will be done on 11/25/2019.   The patient was started on apixaban by the ED.   Echocardiogram was performed on 11/23/2019. EF was 40-45% with mildly decreased function and regional wall motion abnormalities. There is concentric left ventricular hypertrophy. There is severe hypokinesis of the left ventricular, entire apical segment. There was indeterminate diastolic filling due to E-A fusion. RV systolic function is normal.  Left atrium was moderately dilated.   EKG demonstrated NSR with 1st degree AV Block, Age indeterminate anterior infarct, left Axis deviation, abnormal EKG.   The patient was transferred out of the ICU on 11/23/2019 to the hospitalist service. Plan is for him to undergo placement of tunnelled catheter and left AV fistula on 11/25/2019.The renal navigator is making arrangements for the patient to undergo HD as outpatient.  Consultants  . Nephrology . Vascular Surgery . PCCM  Procedures  . Placement of temporary dialysis catheter. . Hemodialysis  Antibiotics   Anti-infectives (From admission, onward)   None     Subjective  The patient is resting comfortably. No new complaints.  Objective   Vitals:  Vitals:   11/24/19 0523 11/24/19  0947  BP: (!) 145/92 137/90  Pulse: 81 91  Resp: 18 18  Temp: 98.5 F (36.9 C) 97.8 F (36.6 C)  SpO2: 97% 95%   Exam:  Constitutional:  . The patient is awake, alert, and oriented x 3. No acute distress. Respiratory:  . No increased work of breathing. . No wheezes, rales, or rhonchi . No tactile fremitus Cardiovascular:  . Regular rate and rhythm . No murmurs, ectopy, or gallups. . No lateral PMI. No thrills. Abdomen:  . Abdomen is soft, non-tender, non-distended . No hernias, masses, or organomegaly . Normoactive bowel sounds.  Musculoskeletal:  . No cyanosis, clubbing, or edema Skin:  . No rashes, lesions, ulcers . palpation of skin: no induration or nodules Neurologic:  . CN 2-12 intact . Sensation all 4 extremities intact Psychiatric:  . Mental status o Mood, affect appropriate o Orientation to person, place, time  . judgment and insight appear intact  I have personally reviewed the following:   Today's Data  . Vitals, CBC, BMP  Micro Data  . MRSA by PCR  Cardiology Data  . EKG . Echocardiogram  Scheduled Meds: . Chlorhexidine Gluconate Cloth  6 each Topical Q0600  . Chlorhexidine Gluconate Cloth  6 each Topical Q0600  . heparin  5,000 Units Subcutaneous Q8H  . hydrALAZINE  25 mg Oral Q8H  . insulin aspart  0-9 Units Subcutaneous TID WC  . mouth rinse  15 mL Mouth Rinse BID  . metoprolol tartrate  12.5 mg Oral BID  . pantoprazole  40 mg Oral Daily  . ticagrelor  90 mg Oral BID  Continuous Infusions:  Active Problems:   Acute renal failure (ARF) (HCC)   LOS: 2 days   A & P  Acute on CKD with new ESRD with Hyperkalemia: Patient presents with acutely worsening kidney function with electrolyte abnormalities and signs of uremia. Nephrology was consulted. A temporary dialysis catheter was placed and the patient underwent emergent HD in the ICU on 11/22/2019. He was also given lokelma and lasix 160 mg IV. He was also given insulin. The patient was  transferred out of the ICU on 11/23/2019 to the hospitalist service. Plan is for him to undergo placement of tunnelled catheter and left AV fistula on 11/25/2019.The renal navigator is making arrangements for the patient to undergo HD as outpatient.  Syncope: Patient presented with a single episode of syncope on the evening of 11/21/2019. States that he did have prodromal symptoms consisting of diaphoresis and cold sensation. Patient remembers "eyes going dark" and could see for a few seconds. Patient does have a history of CAD with DES in the Cx in 2019 and his most recent echo (12/2018) shows EF of 45-50% with mildly dilated LV and severely dilated right LA. Moderate MVR. Mild aortic valve thickness.  Patient presented to the ED with an EKG concerning for AF and was started on apixaban for 1 dose. Upon review of the EKG, the patient was bradycardic in the 50's with regularly, irregular rhythm with bigeminy pattern. He has not had any episodes of hypotension. Patient clinically appears hypervolemic. Orthostatic labs have not been performed. Patient is neurovascularly intact. He was admitted to the ICU and then transferred to a telemetry bed on the floor.   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  Patient does admit to some chest pain this morning in the ED, but is not currently having chest pain.  Change in troponin was 90>89 and BNP was 241.7.  Patient does admit to shortness of breath with exertion and orthopnea.  He admits to intermittent medication adherence secondary to large pill burden. Volume status will be carefully monitored. Fluid and Na will be restricted. Volume will largely be managed through dialysis.  Atrial fibrillation:Patient had an EKG in the ED concerning for atrial fibrillation.  CHA2DS2-VASc score was calculated and  determined before and patient was started on apixaban 5 mg.  He received his first dose this morning at 6 AM. On review of the EKG, the the rhythm appears to be regular early regular with a bigeminy pattern. I will discontinue apixaban now. Patient has a past history of anemia, but not currently anemic. He does have a history of OSA, not on CPAP. There is no overt signs of EKG changing secondary to hyperkalemia. He will be given CPAP at night as needed for OSA. He will be monitored on telemetry. TSH is normal.    Hypertension: Blood pressures are low/normal on hydralazine 25 mg tid and metoprolol 12.5 bid. Monitor. These may be able to be decreased once volume is under good control with HD.  Type 2 diabetes mellitus: Patient presented with hyperglycemia greater than 500. HbA1c was 14.7. Glucoses appear to be well controlled on FSBS and SSI. He is also on a carbohydrate modified diet and hypoglycemic protocol.  Transaminase elevation: Hepatocellular pattern of labs likely secondary to metabolic associated liver disease given his history of diabetes and obesity. Denies IV drug use or risky sexual  history. Not currently sexually active. Screen for HCV and HBV. Monitor CMP.  I have seen and examined this patient myself. I have spent 32 minutes in his evaluation and care.  DVT prophylaxis: heparin  Code Status: Full code  Family Communication: none yea, but lives with two sons in [place] Disposition:    [place], DO [facility] Direct contact: see [email or web address]  7PM-7AM contact night coverage as above 11/24/2019, 3:32 PM  LOS: 1 day

## 2019-11-24 NOTE — H&P (View-Only) (Signed)
[facility]  Subjective  - Feeling better without new complaints.   Objective (!) 145/92 81 98.5 F (36.9 C) (Oral) 18 97%  Intake/Output Summary (Last 24 hours) at 11/24/2019 0727 Last data filed at 11/24/2019 0600 Gross per 24 hour  Intake 920 ml  Output 1495 ml  Net -575 ml    Speech clear without SOB Left UE with palpable radial and brachial pulses Right IJ Temp cath  +-----------------+-------------+----------+--------+  Right Cephalic  Diameter (cm)Depth (cm)Findings  +-----------------+-------------+----------+--------+  Shoulder       0.29     0.28        +-----------------+-------------+----------+--------+  Prox upper arm    0.27     0.47        +-----------------+-------------+----------+--------+  Mid upper arm    0.26     0.29        +-----------------+-------------+----------+--------+  Dist upper arm    0.23     0.34        +-----------------+-------------+----------+--------+  Antecubital fossa  0.35     0.21        +-----------------+-------------+----------+--------+  Prox forearm     0.25     0.39        +-----------------+-------------+----------+--------+  Mid forearm     0.18     0.33        +-----------------+-------------+----------+--------+  Dist forearm     0.17     0.39        +-----------------+-------------+----------+--------+  Wrist        0.11     0.27        +-----------------+-------------+----------+--------+   +-----------------+-------------+----------+--------+  Right Basilic  Diameter (cm)Depth (cm)Findings  +-----------------+-------------+----------+--------+  Prox upper arm    0.56     1.00        +-----------------+-------------+----------+--------+  Mid upper arm    0.34     1.29         +-----------------+-------------+----------+--------+  Dist upper arm    0.34     0.93        +-----------------+-------------+----------+--------+  Antecubital fossa  0.49     0.87        +-----------------+-------------+----------+--------+  Prox forearm     0.22     0.36        +-----------------+-------------+----------+--------+  Mid forearm     0.20     0.23        +-----------------+-------------+----------+--------+  Distal forearm    0.28     0.20        +-----------------+-------------+----------+--------+   +-----------------+-------------+----------+--------+  Left Cephalic  Diameter (cm)Depth (cm)Findings  +-----------------+-------------+----------+--------+  Shoulder       0.39     0.26        +-----------------+-------------+----------+--------+  Prox upper arm    0.34     0.32        +-----------------+-------------+----------+--------+  Mid upper arm    0.39     0.32        +-----------------+-------------+----------+--------+  Dist upper arm    0.42     0.49        +-----------------+-------------+----------+--------+  Antecubital fossa  0.49     0.32        +-----------------+-------------+----------+--------+  Prox forearm     0.28     0.51  occluded  +-----------------+-------------+----------+--------+  Mid forearm     0.30     0.34  occluded  +-----------------+-------------+----------+--------+  Dist forearm     0.18     0.25  occluded  +-----------------+-------------+----------+--------+   +-----------------+-------------+----------+--------+  Left Basilic   Diameter (cm)Depth (cm)Findings  +-----------------+-------------+----------+--------+  Prox upper arm    0.73     1.12         +-----------------+-------------+----------+--------+  Mid upper arm    0.36     1.00        +-----------------+-------------+----------+--------+  Dist upper arm    0.45     0.52        +-----------------+-------------+----------+--------+  Antecubital fossa  0.45     0.36        +-----------------+-------------+----------+--------+  Prox forearm     0.21     0.24        +-----------------+-------------+----------+--------+  Mid forearm     0.22     0.23        +-----------------+-------------+----------+--------+  Distal forearm    0.18     0.24        +-----------------+-------------+----------+--------+   Assessment/Planning: Plan for left UE AV fistula creation and TDC placement 11/26/2019 by Dr. Trula Slade. Patient agrees with plan Will place NPO order tomorrow for Thursday night.  Roxy Horseman 11/24/2019 7:27 AM --  Laboratory Lab Results: Recent Labs    11/22/19 0411 11/23/19 0313  WBC 8.5 7.3  HGB 13.0 13.1  HCT 41.5 41.4  PLT 247 211   BMET Recent Labs    11/23/19 0313 11/24/19 0536  NA 136 135  K 4.5 4.6  CL 99 102  CO2 22 22  GLUCOSE 133* 218*  BUN 48* 54*  CREATININE 4.45* 4.75*  CALCIUM 8.2* 7.4*    COAG Lab Results  Component Value Date   INR 1.04 08/04/2018   INR 1.01 05/15/2018   INR 1.46 08/12/2011   No results found for: PTT  I have seen and evaluated the patient. I agree with the PA note as documented above. Plan left arm AVF and exchange of temp dialysis catheter for [facility] tomorrow.  NPO after midnight.  Nice cephalic vein in left arm at elbow.  Risks and benefits discussed in detail.  Marty Heck, MD [facility] [place] Office: [phone]

## 2019-11-24 NOTE — Progress Notes (Addendum)
[facility]  Subjective  - Feeling better without new complaints.   Objective (!) 145/92 81 98.5 F (36.9 C) (Oral) 18 97%  Intake/Output Summary (Last 24 hours) at 11/24/2019 0727 Last data filed at 11/24/2019 0600 Gross per 24 hour  Intake 920 ml  Output 1495 ml  Net -575 ml    Speech clear without SOB Left UE with palpable radial and brachial pulses Right IJ Temp cath  +-----------------+-------------+----------+--------+  Right Cephalic  Diameter (cm)Depth (cm)Findings  +-----------------+-------------+----------+--------+  Shoulder       0.29     0.28        +-----------------+-------------+----------+--------+  Prox upper arm    0.27     0.47        +-----------------+-------------+----------+--------+  Mid upper arm    0.26     0.29        +-----------------+-------------+----------+--------+  Dist upper arm    0.23     0.34        +-----------------+-------------+----------+--------+  Antecubital fossa  0.35     0.21        +-----------------+-------------+----------+--------+  Prox forearm     0.25     0.39        +-----------------+-------------+----------+--------+  Mid forearm     0.18     0.33        +-----------------+-------------+----------+--------+  Dist forearm     0.17     0.39        +-----------------+-------------+----------+--------+  Wrist        0.11     0.27        +-----------------+-------------+----------+--------+   +-----------------+-------------+----------+--------+  Right Basilic  Diameter (cm)Depth (cm)Findings  +-----------------+-------------+----------+--------+  Prox upper arm    0.56     1.00        +-----------------+-------------+----------+--------+  Mid upper arm    0.34     1.29         +-----------------+-------------+----------+--------+  Dist upper arm    0.34     0.93        +-----------------+-------------+----------+--------+  Antecubital fossa  0.49     0.87        +-----------------+-------------+----------+--------+  Prox forearm     0.22     0.36        +-----------------+-------------+----------+--------+  Mid forearm     0.20     0.23        +-----------------+-------------+----------+--------+  Distal forearm    0.28     0.20        +-----------------+-------------+----------+--------+   +-----------------+-------------+----------+--------+  Left Cephalic  Diameter (cm)Depth (cm)Findings  +-----------------+-------------+----------+--------+  Shoulder       0.39     0.26        +-----------------+-------------+----------+--------+  Prox upper arm    0.34     0.32        +-----------------+-------------+----------+--------+  Mid upper arm    0.39     0.32        +-----------------+-------------+----------+--------+  Dist upper arm    0.42     0.49        +-----------------+-------------+----------+--------+  Antecubital fossa  0.49     0.32        +-----------------+-------------+----------+--------+  Prox forearm     0.28     0.51  occluded  +-----------------+-------------+----------+--------+  Mid forearm     0.30     0.34  occluded  +-----------------+-------------+----------+--------+  Dist forearm     0.18     0.25  occluded  +-----------------+-------------+----------+--------+   +-----------------+-------------+----------+--------+  Left Basilic   Diameter (cm)Depth (cm)Findings  +-----------------+-------------+----------+--------+  Prox upper arm    0.73     1.12         +-----------------+-------------+----------+--------+  Mid upper arm    0.36     1.00        +-----------------+-------------+----------+--------+  Dist upper arm    0.45     0.52        +-----------------+-------------+----------+--------+  Antecubital fossa  0.45     0.36        +-----------------+-------------+----------+--------+  Prox forearm     0.21     0.24        +-----------------+-------------+----------+--------+  Mid forearm     0.22     0.23        +-----------------+-------------+----------+--------+  Distal forearm    0.18     0.24        +-----------------+-------------+----------+--------+   Assessment/Planning: Plan for left UE AV fistula creation and TDC placement 11/26/2019 by Dr. Trula Slade. Patient agrees with plan Will place NPO order tomorrow for Thursday night.  Roxy Horseman 11/24/2019 7:27 AM --  Laboratory Lab Results: Recent Labs    11/22/19 0411 11/23/19 0313  WBC 8.5 7.3  HGB 13.0 13.1  HCT 41.5 41.4  PLT 247 211   BMET Recent Labs    11/23/19 0313 11/24/19 0536  NA 136 135  K 4.5 4.6  CL 99 102  CO2 22 22  GLUCOSE 133* 218*  BUN 48* 54*  CREATININE 4.45* 4.75*  CALCIUM 8.2* 7.4*    COAG Lab Results  Component Value Date   INR 1.04 08/04/2018   INR 1.01 05/15/2018   INR 1.46 08/12/2011   No results found for: PTT  I have seen and evaluated the patient. I agree with the PA note as documented above. Plan left arm AVF and exchange of temp dialysis catheter for [employer] tomorrow.  NPO after midnight.  Nice cephalic vein in left arm at elbow.  Risks and benefits discussed in detail.  Marty Heck, MD [facility] Office: [phone]

## 2019-11-24 NOTE — Progress Notes (Signed)
Renal Navigator received notification from Renal PA/Grace E that per Dr. Jonnie Finner, patient needs to be referred for OP HD treatment for ESRD. Renal Navigator met with patient, who states this is also his understanding, though he states he is still processing that the time for dialysis is now here. Navigator provided support and asked patient to think about coping strategies. He states his sons are good supports. He states he prays, sleeps, watches tv. Renal Navigator discussed the importance of compliance with treatment plan when patient asked if "this" increases the risk of hospital readmissions. Renal Navigator was honest with patient and explained that his kidneys are no longer working and that HD does not heal his kidneys, but rather works for them from now on. We discussed the importance of mental health, physical health, and diet. We talked about the multidisciplinary team he will have at the clinic. He seemed appreciative.  Referral submitted to request treatment for ESRD to [facility] clinic, as this is the clinic closest to his home. Patient requests first shift if possible.  Renal Navigator will follow closely.  Alphonzo Cruise, [place] Renal Navigator [phone]

## 2019-11-24 NOTE — Progress Notes (Addendum)
Inpatient Diabetes Program Recommendations  AACE/ADA: New Consensus Statement on Inpatient Glycemic Control (2015)  Target Ranges:  Prepandial:   less than 140 mg/dL      Peak postprandial:   less than 180 mg/dL (1-2 hours)      Critically ill patients:  140 - 180 mg/dL   Lab Results  Component Value Date   GLUCAP 218 (H) 11/24/2019   HGBA1C 14.7 (H) 11/22/2019    Review of Glycemic Control Results for Edward [name], Edward [name] (MRN [id number]) as of 11/24/2019 10:02  Ref. Range 11/23/2019 08:23 11/23/2019 11:49 11/23/2019 16:57 11/24/2019 01:56 11/24/2019 07:20  Glucose-Capillary Latest Ref Range: 70 - 99 mg/dL 125 (H) 145 (H) 250 (H) 217 (H) 218 (H)   Diabetes history: DM 2 Outpatient Diabetes medications: Lantus 20 units bid, Novolog 15 units tid Current orders for Inpatient glycemic control:  Novolog 0-9 units tid  Inpatient Diabetes Program Recommendations:    Glucose >200's. Consider Levemir 8-10 units Daily.  Noted A1c 14.7%, pt likely noncompliant with insulin possibly due to hypo with renal function worsening. Will see pt today to discuss.  Addendum 2:10 pm:  Spoke with pt at bedside regarding his A1c level of 14.7%. Pt did not realize it was so high. Pt reports he thought his glucose levels were better on "the pills" but he was taken off of Janumet in January. Discussed with pt that depending on his kidneys his doctor may have taken him off to protect his kidney function. Pt reports skipping doses of insulin to prevent himself from having hypoglycemia. He would miss both Lantus and Novolog injections. Discussed glucose and A1c goals. Discussed with pt when to call his PCP for adjustments. Discussed that we would follow his glucose trends in the hospital and put him on a safe regimen to go home on.   Thanks,  Tama Headings RN, MSN, BC-ADM Inpatient Diabetes Coordinator Team Pager [phone] (8a-5p)

## 2019-11-24 NOTE — Progress Notes (Signed)
[facility] Progress Note  Subjective: doing well no c/o  Vitals:   11/24/19 0127 11/24/19 0202 11/24/19 0523 11/24/19 0947  BP: 126/76 (!) 142/89 (!) 145/92 137/90  Pulse: 72 78 81 91  Resp: 14 18 18 18   Temp:  97.7 F (36.5 C) 98.5 F (36.9 C) 97.8 F (36.6 C)  TempSrc:  Oral Oral Oral  SpO2: 96% 96% 97% 95%  Weight:      Height:        Exam: Gen alert no distress No jvd or bruits Chest clear bilat to bases RRR no MRG Abd soft ntnd no mass or ascites +bs MS R BKA, L TMA Ext 1-2+ L pretib edema Neuro is alert, Ox 3 , nf, no asterixis    Home meds:  - lasix 80 bid/ hydralazine 75 tid/ zaroxolyn 2.5 weekly/ metoprolol xl 50  - janumet 50-1000 qd/ insulin lantus 20u bid/ 70-30 insulin 20u bid  - brilinta 90 bid/ aspirin 81/ lipitor 80/ imdur 30 qd/ zetia 10  - xanax 0.25 tid prn  - prn's/ vitamins/ supplements    CXR - mild pulm edema     EKG - brady HR 40- 50's, no P waves, junctional vs afib   Assessment/ Plan: 1. Syncope/ hyperkalemia/ bradycardia - resolved w HD 2. Renal failure - new esrd patient. Baseline creat 2.5 last yr, creat here 5.2 w/ uremic symptoms x 2 mos. Started HD, has temp cath , VVS planning TDC/ AVF surgery this Friday 4/9. 2nd HD today. CLIP in progress.  3. Bradycardia - resolved 4. HTN - getting hydral/ metoprolol, lowered dosing yesterday, BP's good 5. DM2 w/ complications 6. PAD - w/ R BKA and L TMA   Rob Hira Trent 11/24/2019, 10:42 AM   Recent Labs  Lab 11/22/19 0411 11/22/19 0808 11/23/19 0313 11/24/19 0536  K 6.9*   < > 4.5 4.6  BUN 62*   < > 48* 54*  CREATININE 5.32*   < > 4.45* 4.75*  CALCIUM 8.3*   < > 8.2* 7.4*  HGB 13.0  --  13.1  --    < > = values in this interval not displayed.   Inpatient medications: . Chlorhexidine Gluconate Cloth  6 each Topical Q0600  . Chlorhexidine Gluconate Cloth  6 each Topical Q0600  . heparin  5,000 Units Subcutaneous Q8H  . hydrALAZINE  25 mg Oral Q8H  . insulin  aspart  0-9 Units Subcutaneous TID WC  . mouth rinse  15 mL Mouth Rinse BID  . metoprolol tartrate  12.5 mg Oral BID  . pantoprazole  40 mg Oral Daily  . ticagrelor  90 mg Oral BID    acetaminophen, ALPRAZolam, ondansetron (ZOFRAN) IV

## 2019-11-24 NOTE — Progress Notes (Signed)
Initial Nutrition Assessment  DOCUMENTATION CODES:   Not applicable  INTERVENTION:   Snacks TID between meals  Add Renal MVI daily; pt to continue at discharge. This was discussed with patient  Provided written and verbal dialysis diet education; pt very receptive to education, adherence likely. Pt to follow-up with Renal RD at outpatient HD center   NUTRITION DIAGNOSIS:   Food and nutrition related knowledge deficit related to (new ESRD) as evidenced by estimated needs.  GOAL:   Patient will meet greater than or equal to 90% of their needs  MONITOR:   PO intake, Supplement acceptance, Labs, Weight trends  REASON FOR ASSESSMENT:   Consult Diet education  ASSESSMENT:   49 yo male admitted with acute on chronic renal failure with progression to ESRD with  uremia, hyperkalemia and volume overload and was emergently dialyzed on 4/5. PMH includes CKD, CHF, CAD, HTN, DM, PVD with L transmetatarsal amputation and right BKA.   4/5 Admit, 1st HD, temp HD cath placed  2nd iHD today; noted plan for tunneled HD cath as well as left AV fistula/graft on 4/8  Arrangements are being made for outpatient HD  Recorded po intake 75-100% of meals. Appetite is good, pt ate 100% of lunch tray today and still hungry. Pt agreeable to high protein snacks between meals  Pt denies weight loss; current weight 110.7 kg; noted admit weight of 121.8 kg  Diet Education: Consult received for new ESRD diet ed Provide verbal and written dialysis diet education to patient. Provided education materials from [facility] via [email or web address] on phosphorus, potassium, sodium and protein.  Explained why diet restrictions are needed and provided lists of foods to limit/avoid that are high potassium, sodium, and phosphorus. Provided specific recommendations on safer alternatives of these foods. Strongly encouraged compliance of this diet.   Discussed importance of protein intake at each meal and snack. Provided examples of  how to maximize protein intake throughout the day. Discussed importance ot taking renal MVI daily in the evening (after HD). Discussed need for fluid restriction with dialysis, importance of minimizing weight gain between HD treatments, and renal-friendly beverage options.  Encouraged pt to discuss specific diet questions/concerns with RD at HD outpatient facility. Teach back method used.   Labs: CBGs 124-250 Meds: potassium wdl, sodium wdl, no phosphorus  Diet Order:   Diet Order            Diet NPO time specified  Diet effective midnight        Diet renal/carb modified with fluid restriction Diet-HS Snack? Nothing; Fluid restriction: 1800 mL Fluid; Room service appropriate? Yes; Fluid consistency: Thin  Diet effective now              EDUCATION NEEDS:   Education needs have been addressed  Skin:  Skin Assessment: Reviewed RN Assessment  Last BM:  4/6  Height:   Ht Readings from Last 1 Encounters:  11/22/19 6\' 1"  (1.854 m)    Weight:   Wt Readings from Last 1 Encounters:  11/23/19 110.7 kg    BMI:  Body mass index is 32.2 kg/m.  Estimated Nutritional Needs:   Kcal:  2300-2500 kcals  Protein:  115-130 g  Fluid:  1000 mL plus UOP   Kerman Passey MS, RDN, LDN, CNSC RD Pager Number and Weekend/On-Call After Hours Pager Located in [place]

## 2019-11-25 ENCOUNTER — Inpatient Hospital Stay (HOSPITAL_COMMUNITY): Payer: Medicare Other

## 2019-11-25 ENCOUNTER — Encounter (HOSPITAL_COMMUNITY)
Admission: EM | Disposition: A | Discharge status: Home / Self Care | Payer: Self-pay | Source: Home / Self Care | Attending: Internal Medicine

## 2019-11-25 ENCOUNTER — Inpatient Hospital Stay (HOSPITAL_COMMUNITY): Payer: Medicare Other | Admitting: Anesthesiology

## 2019-11-25 HISTORY — PX: AV FISTULA PLACEMENT: SHX1204

## 2019-11-25 HISTORY — PX: INSERTION OF DIALYSIS CATHETER: SHX1324

## 2019-11-25 LAB — CBC WITH DIFFERENTIAL/PLATELET
Abs Immature Granulocytes: 0.05 10*3/uL (ref 0.00–0.07)
Basophils Absolute: 0 10*3/uL (ref 0.0–0.1)
Basophils Relative: 1 %
Eosinophils Absolute: 0.3 10*3/uL (ref 0.0–0.5)
Eosinophils Relative: 6 %
HCT: 40.6 % (ref 39.0–52.0)
Hemoglobin: 13.2 g/dL (ref 13.0–17.0)
Immature Granulocytes: 1 %
Lymphocytes Relative: 26 %
Lymphs Abs: 1.3 10*3/uL (ref 0.7–4.0)
MCH: 27.6 pg (ref 26.0–34.0)
MCHC: 32.5 g/dL (ref 30.0–36.0)
MCV: 84.8 fL (ref 80.0–100.0)
Monocytes Absolute: 0.7 10*3/uL (ref 0.1–1.0)
Monocytes Relative: 15 %
Neutro Abs: 2.5 10*3/uL (ref 1.7–7.7)
Neutrophils Relative %: 51 %
Platelets: 211 10*3/uL (ref 150–400)
RBC: 4.79 MIL/uL (ref 4.22–5.81)
RDW: 13.6 % (ref 11.5–15.5)
WBC: 4.8 10*3/uL (ref 4.0–10.5)
nRBC: 0 % (ref 0.0–0.2)

## 2019-11-25 LAB — BASIC METABOLIC PANEL
Anion gap: 9 (ref 5–15)
BUN: 29 mg/dL — ABNORMAL HIGH (ref 6–20)
CO2: 26 mmol/L (ref 22–32)
Calcium: 7.4 mg/dL — ABNORMAL LOW (ref 8.9–10.3)
Chloride: 103 mmol/L (ref 98–111)
Creatinine, Ser: 3.02 mg/dL — ABNORMAL HIGH (ref 0.61–1.24)
GFR calc Af Amer: 27 mL/min — ABNORMAL LOW (ref >60)
GFR calc non Af Amer: 23 mL/min — ABNORMAL LOW (ref >60)
Glucose, Bld: 203 mg/dL — ABNORMAL HIGH (ref 70–99)
Potassium: 3.9 mmol/L (ref 3.5–5.1)
Sodium: 138 mmol/L (ref 135–145)

## 2019-11-25 LAB — GLUCOSE, CAPILLARY
Glucose-Capillary: 212 mg/dL — ABNORMAL HIGH (ref 70–99)
Glucose-Capillary: 168 mg/dL — ABNORMAL HIGH (ref 70–99)
Glucose-Capillary: 167 mg/dL — ABNORMAL HIGH (ref 70–99)
Glucose-Capillary: 147 mg/dL — ABNORMAL HIGH (ref 70–99)
Glucose-Capillary: 236 mg/dL — ABNORMAL HIGH (ref 70–99)

## 2019-11-25 SURGERY — ARTERIOVENOUS (AV) FISTULA CREATION
Anesthesia: Monitor Anesthesia Care | Site: Neck

## 2019-11-25 MED ORDER — CHLORHEXIDINE GLUCONATE CLOTH 2 % EX PADS
6.0000 | MEDICATED_PAD | Freq: Every day | CUTANEOUS | Status: DC
  Administered 2019-11-26: 6 via TOPICAL

## 2019-11-25 MED ORDER — 0.9 % SODIUM CHLORIDE (POUR BTL) OPTIME
TOPICAL | Status: DC | PRN
  Administered 2019-11-25: 1000 mL

## 2019-11-25 MED ORDER — FENTANYL CITRATE (PF) 100 MCG/2ML IJ SOLN
INTRAMUSCULAR | Status: DC | PRN
  Administered 2019-11-25: 25 ug via INTRAVENOUS

## 2019-11-25 MED ORDER — LIDOCAINE-EPINEPHRINE 0.5 %-1:200000 IJ SOLN
INTRAMUSCULAR | Status: DC | PRN
  Administered 2019-11-25: 50 mL

## 2019-11-25 MED ORDER — DEXAMETHASONE SODIUM PHOSPHATE 10 MG/ML IJ SOLN
INTRAMUSCULAR | Status: AC
  Filled 2019-11-25: qty 1

## 2019-11-25 MED ORDER — PROPOFOL 10 MG/ML IV BOLUS
INTRAVENOUS | Status: AC
  Filled 2019-11-25: qty 20

## 2019-11-25 MED ORDER — HEPARIN SODIUM (PORCINE) 1000 UNIT/ML IJ SOLN
INTRAMUSCULAR | Status: AC
  Filled 2019-11-25: qty 1

## 2019-11-25 MED ORDER — SODIUM CHLORIDE 0.9% FLUSH: 10.0000 mL | INTRAVENOUS | Status: DC | PRN

## 2019-11-25 MED ORDER — OXYCODONE-ACETAMINOPHEN 7.5-325 MG PO TABS: 1.0000 | ORAL_TABLET | ORAL | 0 refills | Status: DC | PRN

## 2019-11-25 MED ORDER — SODIUM CHLORIDE 0.9 % IV SOLN
INTRAVENOUS | Status: DC | PRN
  Administered 2019-11-25: 500 mL

## 2019-11-25 MED ORDER — MIDAZOLAM HCL 2 MG/2ML IJ SOLN
INTRAMUSCULAR | Status: AC
  Filled 2019-11-25: qty 2

## 2019-11-25 MED ORDER — PROPOFOL 500 MG/50ML IV EMUL
INTRAVENOUS | Status: DC | PRN
  Administered 2019-11-25: 55 ug/kg/min via INTRAVENOUS

## 2019-11-25 MED ORDER — CEFAZOLIN SODIUM-DEXTROSE 2-3 GM-%(50ML) IV SOLR
INTRAVENOUS | Status: DC | PRN
  Administered 2019-11-25: 2 g via INTRAVENOUS

## 2019-11-25 MED ORDER — SODIUM CHLORIDE 0.9 % IV SOLN: INTRAVENOUS | Status: DC | PRN

## 2019-11-25 MED ORDER — OXYCODONE-ACETAMINOPHEN 5-325 MG PO TABS
1.0000 | ORAL_TABLET | ORAL | Status: DC | PRN
  Administered 2019-11-25 – 2019-11-26 (×4): 1 via ORAL
  Filled 2019-11-25 (×4): qty 1

## 2019-11-25 MED ORDER — LIDOCAINE 2% (20 MG/ML) 5 ML SYRINGE
INTRAMUSCULAR | Status: AC
  Filled 2019-11-25: qty 5

## 2019-11-25 MED ORDER — SODIUM CHLORIDE 0.9 % IV SOLN
INTRAVENOUS | Status: AC
  Filled 2019-11-25: qty 1.2

## 2019-11-25 MED ORDER — LIDOCAINE HCL (CARDIAC) PF 100 MG/5ML IV SOSY
PREFILLED_SYRINGE | INTRAVENOUS | Status: DC | PRN
  Administered 2019-11-25: 40 mg via INTRATRACHEAL

## 2019-11-25 MED ORDER — DIPHENHYDRAMINE HCL 50 MG/ML IJ SOLN
INTRAMUSCULAR | Status: DC | PRN
  Administered 2019-11-25: 25 mg via INTRAVENOUS

## 2019-11-25 MED ORDER — HEPARIN SODIUM (PORCINE) 1000 UNIT/ML IJ SOLN
INTRAMUSCULAR | Status: DC | PRN
  Administered 2019-11-25: 3400 [IU]

## 2019-11-25 MED ORDER — ONDANSETRON HCL 4 MG/2ML IJ SOLN
INTRAMUSCULAR | Status: DC | PRN
  Administered 2019-11-25: 4 mg via INTRAVENOUS

## 2019-11-25 MED ORDER — FENTANYL CITRATE (PF) 250 MCG/5ML IJ SOLN
INTRAMUSCULAR | Status: AC
  Filled 2019-11-25: qty 5

## 2019-11-25 MED ORDER — CEFAZOLIN SODIUM-DEXTROSE 2-4 GM/100ML-% IV SOLN
INTRAVENOUS | Status: AC
  Filled 2019-11-25: qty 100

## 2019-11-25 MED ORDER — MIDAZOLAM HCL 5 MG/5ML IJ SOLN
INTRAMUSCULAR | Status: DC | PRN
  Administered 2019-11-25: 2 mg via INTRAVENOUS

## 2019-11-25 MED ORDER — LIDOCAINE-EPINEPHRINE 0.5 %-1:200000 IJ SOLN
INTRAMUSCULAR | Status: AC
  Filled 2019-11-25: qty 1

## 2019-11-25 MED ORDER — SODIUM CHLORIDE 0.9 % IV SOLN: INTRAVENOUS | Status: DC

## 2019-11-25 MED FILL — OXYCODONE-APAP 7.5-325MG: 7.5-325 | 1 days supply | Qty: 6 | Fill #0

## 2019-11-25 SURGICAL SUPPLY — 61 items
ADH SKN CLS APL DERMABOND .7 (GAUZE/BANDAGES/DRESSINGS) ×4
ARMBAND PINK RESTRICT EXTREMIT (MISCELLANEOUS) ×8 IMPLANT
BAG DECANTER FOR FLEXI CONT (MISCELLANEOUS) ×4 IMPLANT
BIOPATCH RED 1 DISK 7.0 (GAUZE/BANDAGES/DRESSINGS) ×3 IMPLANT
BIOPATCH RED 1IN DISK 7.0MM (GAUZE/BANDAGES/DRESSINGS) ×1
CANISTER SUCT 3000ML PPV (MISCELLANEOUS) ×4 IMPLANT
CANNULA VESSEL 3MM 2 BLNT TIP (CANNULA) ×4 IMPLANT
CATH PALINDROME RT-P 15FX19CM (CATHETERS) IMPLANT
CATH PALINDROME RT-P 15FX23CM (CATHETERS) ×2 IMPLANT
CATH PALINDROME RT-P 15FX28CM (CATHETERS) IMPLANT
CATH PALINDROME RT-P 15FX55CM (CATHETERS) IMPLANT
CLIP LIGATING EXTRA MED SLVR (CLIP) ×4 IMPLANT
CLIP LIGATING EXTRA SM BLUE (MISCELLANEOUS) ×4 IMPLANT
COVER PROBE W GEL 5X96 (DRAPES) ×4 IMPLANT
COVER SURGICAL LIGHT HANDLE (MISCELLANEOUS) ×4 IMPLANT
COVER WAND RF STERILE (DRAPES) ×4 IMPLANT
DECANTER SPIKE VIAL GLASS SM (MISCELLANEOUS) ×4 IMPLANT
DERMABOND ADVANCED (GAUZE/BANDAGES/DRESSINGS) ×4
DERMABOND ADVANCED .7 DNX12 (GAUZE/BANDAGES/DRESSINGS) ×2 IMPLANT
DRAPE C-ARM 42X72 X-RAY (DRAPES) ×4 IMPLANT
DRAPE CHEST BREAST 15X10 FENES (DRAPES) ×4 IMPLANT
DRSG COVADERM PLUS 2X2 (GAUZE/BANDAGES/DRESSINGS) ×2 IMPLANT
ELECT REM PT RETURN 9FT ADLT (ELECTROSURGICAL) ×4
ELECTRODE REM PT RTRN 9FT ADLT (ELECTROSURGICAL) ×2 IMPLANT
GAUZE 4X4 16PLY RFD (DISPOSABLE) ×4 IMPLANT
GLOVE BIO SURGEON STRL SZ 6.5 (GLOVE) ×3 IMPLANT
GLOVE BIO SURGEON STRL SZ7 (GLOVE) ×4 IMPLANT
GLOVE BIO SURGEONS STRL SZ 6.5 (GLOVE) ×3
GLOVE BIOGEL PI IND STRL 6.5 (GLOVE) IMPLANT
GLOVE BIOGEL PI IND STRL 7.0 (GLOVE) IMPLANT
GLOVE BIOGEL PI INDICATOR 6.5 (GLOVE) ×4
GLOVE BIOGEL PI INDICATOR 7.0 (GLOVE) ×2
GLOVE SS BIOGEL STRL SZ 7.5 (GLOVE) ×2 IMPLANT
GLOVE SUPERSENSE BIOGEL SZ 7.5 (GLOVE) ×4
GOWN STRL REUS W/ TWL LRG LVL3 (GOWN DISPOSABLE) ×6 IMPLANT
GOWN STRL REUS W/TWL LRG LVL3 (GOWN DISPOSABLE) ×20
KIT BASIN OR (CUSTOM PROCEDURE TRAY) ×4 IMPLANT
KIT TURNOVER KIT B (KITS) ×4 IMPLANT
NDL 18GX1X1/2 (RX/OR ONLY) (NEEDLE) ×2 IMPLANT
NDL HYPO 25GX1X1/2 BEV (NEEDLE) ×2 IMPLANT
NEEDLE 18GX1X1/2 (RX/OR ONLY) (NEEDLE) ×4 IMPLANT
NEEDLE 22X1 1/2 (OR ONLY) (NEEDLE) IMPLANT
NEEDLE HYPO 25GX1X1/2 BEV (NEEDLE) ×4 IMPLANT
NS IRRIG 1000ML POUR BTL (IV SOLUTION) ×4 IMPLANT
PACK CV ACCESS (CUSTOM PROCEDURE TRAY) ×4 IMPLANT
PACK SURGICAL SETUP 50X90 (CUSTOM PROCEDURE TRAY) ×4 IMPLANT
PAD ARMBOARD 7.5X6 YLW CONV (MISCELLANEOUS) ×8 IMPLANT
SOAP 2 % CHG 4 OZ (WOUND CARE) ×4 IMPLANT
SUT ETHILON 3 0 PS 1 (SUTURE) ×4 IMPLANT
SUT PROLENE 6 0 CC (SUTURE) ×6 IMPLANT
SUT VIC AB 3-0 SH 27 (SUTURE) ×4
SUT VIC AB 3-0 SH 27X BRD (SUTURE) ×2 IMPLANT
SUT VICRYL 4-0 PS2 18IN ABS (SUTURE) ×4 IMPLANT
SYR 10ML LL (SYRINGE) ×4 IMPLANT
SYR 20ML LL LF (SYRINGE) ×4 IMPLANT
SYR 5ML LL (SYRINGE) ×8 IMPLANT
SYR CONTROL 10ML LL (SYRINGE) ×4 IMPLANT
TOWEL GREEN STERILE (TOWEL DISPOSABLE) ×8 IMPLANT
TOWEL GREEN STERILE FF (TOWEL DISPOSABLE) ×4 IMPLANT
UNDERPAD 30X30 (UNDERPADS AND DIAPERS) ×4 IMPLANT
WATER STERILE IRR 1000ML POUR (IV SOLUTION) ×4 IMPLANT

## 2019-11-25 NOTE — Progress Notes (Signed)
PROGRESS NOTE  Edward [name] EYC:[id number] DOB: 02-24-1971 DOA: 11/21/2019 PCP: [name], MD  Brief History   The patient is a 49 yr old man who presented to [facility] ED with complaints of shortness of breath, nausea, diaphoresis, and syncope x 1. He had not urinated in 4 days, and was found to be in acute renal failure, hyperkalemia, and uremic. He was also volume overloaded. A temporary dialysis catheter was placed, and the patient was dialyzed emergently in the ICU on 11/22/2019. The patient was also found to be in new onset atrial fibrillation. The patient was restarted on the hydralazine, metoprolol, and Brillinta after dialysis. Vascular surgery was consulted to place a tunneled catheter and placement of a left AV fistula or graft. This will be done on 11/25/2019.   The patient was started on apixaban by the ED.   Echocardiogram was performed on 11/23/2019. EF was 40-45% with mildly decreased function and regional wall motion abnormalities. There is concentric left ventricular hypertrophy. There is severe hypokinesis of the left ventricular, entire apical segment. There was indeterminate diastolic filling due to E-A fusion. RV systolic function is normal.  Left atrium was moderately dilated.   EKG demonstrated NSR with 1st degree AV Block, Age indeterminate anterior infarct, left Axis deviation, abnormal EKG.   The patient was transferred out of the ICU on 11/23/2019 to the hospitalist service. The patient underwent placement of tunnelled catheter and left AV fistula this morning. He will be set up for outpatient HD by renal navigator.  Consultants  . Nephrology . Vascular Surgery . PCCM  Procedures  . Placement of temporary dialysis catheter. . Hemodialysis  Antibiotics   Anti-infectives (From admission, onward)   Start     Dose/Rate Route Frequency Ordered Stop   11/25/19 1129  ceFAZolin (ANCEF) 2-4 GM/100ML-% IVPB    Note to Pharmacy: Elige Ko   : cabinet override       11/25/19 1129 11/25/19 2344     Subjective  The patient is resting comfortably. No new complaints.  Objective   Vitals:  Vitals:   11/25/19 1404 11/25/19 1615  BP: (!) 152/92 (!) 161/108  Pulse: 80 91  Resp: 15 18  Temp: 98.2 F (36.8 C) 98.3 F (36.8 C)  SpO2: 100% 98%   Exam:  Constitutional:  . The patient is awake, alert, and oriented x 3. No acute distress. Respiratory:  . No increased work of breathing. . No wheezes, rales, or rhonchi . No tactile fremitus Cardiovascular:  . Regular rate and rhythm . No murmurs, ectopy, or gallups. . No lateral PMI. No thrills. Abdomen:  . Abdomen is soft, non-tender, non-distended . No hernias, masses, or organomegaly . Normoactive bowel sounds.  Musculoskeletal:  . No cyanosis, clubbing, or edema Skin:  . No rashes, lesions, ulcers . palpation of skin: no induration or nodules Neurologic:  . CN 2-12 intact . Sensation all 4 extremities intact Psychiatric:  . Mental status o Mood, affect appropriate o Orientation to person, place, time  . judgment and insight appear intact  I have personally reviewed the following:   Today's Data  . Vitals, CBC, BMP  Micro Data  . MRSA by PCR  Cardiology Data  . EKG . Echocardiogram  Scheduled Meds: . Chlorhexidine Gluconate Cloth  6 each Topical Q0600  . Chlorhexidine Gluconate Cloth  6 each Topical Q0600  . [START ON 11/26/2019] Chlorhexidine Gluconate Cloth  6 each Topical Q0600  . heparin  5,000 Units Subcutaneous Q8H  . hydrALAZINE  25 mg Oral Q8H  . insulin aspart  0-9 Units Subcutaneous TID WC  . mouth rinse  15 mL Mouth Rinse BID  . metoprolol tartrate  12.5 mg Oral BID  . pantoprazole  40 mg Oral Daily  . ticagrelor  90 mg Oral BID   Continuous Infusions: . ceFAZolin      Active Problems:   Acute renal failure (ARF) (HCC)   LOS: 3 days   A & P  Acute on CKD with new ESRD with Hyperkalemia: Patient presents with acutely worsening kidney function with  electrolyte abnormalities and signs of uremia. Nephrology was consulted. A temporary dialysis catheter was placed and the patient underwent emergent HD in the ICU on 11/22/2019. He was also given lokelma and lasix 160 mg IV. He was also given insulin. The patient was transferred out of the ICU on 11/23/2019 to the hospitalist service. The patient underwent placement of tunnelled catheter and left AV fistula this morning. He will be set up for outpatient HD by renal navigator.  Syncope: Patient presented with a single episode of syncope on the evening of 11/21/2019. States that he did have prodromal symptoms consisting of diaphoresis and cold sensation. Patient remembers "eyes going dark" and could see for a few seconds. Patient does have a history of CAD with DES in the Cx in 2019 and his most recent echo (12/2018) shows EF of 45-50% with mildly dilated LV and severely dilated right LA. Moderate MVR. Mild aortic valve thickness.  Patient presented to the ED with an EKG concerning for AF and was started on apixaban for 1 dose. Upon review of the EKG, the patient was bradycardic in the 50's with regularly, irregular rhythm with bigeminy pattern. He has not had any episodes of hypotension. Patient clinically appears hypervolemic. Orthostatic labs have not been performed. Patient is neurovascularly intact. He was admitted to the ICU and then transferred to a telemetry bed on the floor.   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  Patient does admit to some chest pain this morning in the ED, but is not currently having chest pain.  Change in troponin was 90>89 and BNP was 241.7.  Patient does admit to shortness of breath with exertion and orthopnea.  He admits to intermittent medication adherence secondary to large pill burden. Volume status will be  carefully monitored. Fluid and Na will be restricted. Volume will largely be managed through dialysis.  Atrial fibrillation:Patient had an EKG in the ED concerning for atrial fibrillation.  CHA2DS2-VASc score was calculated and determined before and patient was started on apixaban 5 mg.  He received his first dose this morning at 6 AM. On review of the EKG, the the rhythm appears to be regular early regular with a bigeminy pattern. I will discontinue apixaban now. Patient has a past history of anemia, but not currently anemic. He does have a history of OSA, not on CPAP. There is no overt signs of EKG changing secondary to hyperkalemia. He will be given CPAP at night as needed for OSA. He will be monitored on telemetry. TSH is normal.    Hypertension: Blood pressures are low/normal on hydralazine 25 mg tid and metoprolol 12.5 bid. Monitor. These may be able to be decreased once volume is under good control with HD.  Type 2 diabetes mellitus: Patient presented with hyperglycemia greater than 500.  HbA1c was 14.7. Glucoses appear to be well controlled on FSBS and SSI. He is also on a carbohydrate modified diet and hypoglycemic protocol.  Transaminase elevation: Hepatocellular pattern of labs likely secondary to metabolic associated liver disease given his history of diabetes and obesity. Denies IV drug use or risky sexual history. Not currently sexually active. Screen for HCV and HBV. Monitor CMP.  I have seen and examined this patient myself. I have spent 32 minutes in his evaluation and care.  DVT prophylaxis: heparin  Code Status: Full code  Family Communication: none yea, but lives with two sons in [place] Disposition: Patient is from home. Anticipate discharge to home. Barrier to discharge: clearance for discharge by nephrology/vascular surgery.   Soley Harriss, DO [facility] Direct contact: see [email or web address]  7PM-7AM contact night coverage as above 11/25/2019, 4:56 PM  LOS: 1 day

## 2019-11-25 NOTE — Plan of Care (Signed)
  Problem: Education: Goal: Knowledge of disease and its progression will improve Outcome: Progressing   Problem: Health Behavior/Discharge Planning: Goal: Ability to manage health-related needs will improve Outcome: Progressing   

## 2019-11-25 NOTE — Transfer of Care (Signed)
Immediate Anesthesia Transfer of Care Note  Patient: RUE TINNEL  Procedure(s) Performed: LEFT ARM Brachiocephalic  ARTERIOVENOUS (AV) FISTULA CREATION (Left Arm Upper) INSERTION OF Righ Internal Jugular DIALYSIS CATHETER (N/A Neck)  Patient Location: PACU  Anesthesia Type:MAC  Level of Consciousness: awake, alert  and oriented  Airway & Oxygen Therapy: Patient Spontanous Breathing and Patient connected to nasal cannula oxygen  Post-op Assessment: Report given to RN, Post -op Vital signs reviewed and stable and Patient moving all extremities X 4  Post vital signs: Reviewed and stable  Last Vitals:  Vitals Value Taken Time  BP 151/95 11/25/19 1349  Temp    Pulse 80 11/25/19 1350  Resp 20 11/25/19 1350  SpO2 99 % 11/25/19 1350  Vitals shown include unvalidated device data.  Last Pain:  Vitals:   11/25/19 1039  TempSrc:   PainSc: 0-No pain      Patients Stated Pain Goal: 0 (63/33/54 5625)  Complications: No apparent anesthesia complications

## 2019-11-25 NOTE — Op Note (Signed)
    OPERATIVE REPORT  DATE OF SURGERY: 11/25/2019  PATIENT: Edward [name], 49 y.o. male MRN: [id number]  DOB: 10/12/1970  PRE-OPERATIVE DIAGNOSIS: End-stage renal disease  POST-OPERATIVE DIAGNOSIS:  Same  PROCEDURE: #1 conversion of right IJ temporary cath to tunneled catheter, #2 left brachiocephalic AV fistula  SURGEON:  [name], M.D.  PHYSICIAN ASSISTANT: Corrie Baglia PA-C  ANESTHESIA: Local with sedation  EBL: per anesthesia record  Total I/O In: 433.1 [I.V.:383.1; IV Piggyback:50] Out: 10 [Blood:10]  BLOOD ADMINISTERED: none  DRAINS: none  SPECIMEN: none  COUNTS CORRECT:  YES  PATIENT DISPOSITION:  PACU - hemodynamically stable  PROCEDURE DETAILS: Patient was taken to room placed to position where the area of right and left neck and chest prepped draped you sterile fashion including the temporary catheter.  Guidewire was passed through the temporary catheter and the catheter was removed.  The guidewire was in the level of the distal right atrium this was confirmed with fluoroscopy.  Dilator and peel-away sheath was passed over the guidewire and the dilator and guidewire removed.  A 23 cm tunnel catheter was sent through the peel-away sheath which was removed.  The catheter was brought through subcutaneous tunnel through a separate stab incision.  The 2 lm ports were attached and both lumens flushed and aspirated easily and were locked with 1000/cc heparin.  The catheter secured to the skin with a 3-0 nylon stitch and the entry site was closed with a 4-0 subcuticular Vicryl stitch.   Attention was then turned to the left arm.  SonoSite ultrasound was used to visualize the vein.  The cephalic vein was thrombosed from the wrist to the antecubital space.  The vein was taken from the antecubital space proximally.  Using local anesthesia, incision was made over the antecubital space carried down to isolate the brachial artery which had minimal atherosclerotic change and was  of large caliber.  The vein was identified through the same incision and was moderate size.  Tributary branches were ligated with 3-0 silk ties and divided.  The vein was ligated distally and the basilic vein was left patent.  The vein was mobilized to the level of the brachial artery.  The artery was occluded proximally distally and was opened with 11 blade and sent longstanding with Potts scissors.  The vein was cut to the appropriate length and was spatulated and sewn end-to-side to the artery with a running 6-0 Prolene suture.  Clamps removed and excellent thrill was noted.  The wounds irrigated with saline.  Hemostasis to electrocautery.  Wounds were closed with 3-0 Vicryl in the subcutaneous and subcuticular tissue.  Sterile dressing was applied and the patient was transferred to the recovery room where chest x-rays pending   Rosetta Posner, M.D., [facility] 11/25/2019 1:52 PM

## 2019-11-25 NOTE — Discharge Instructions (Signed)
[facility]  Discharge Instructions  AV Fistula or Graft Surgery for Dialysis Access  Please refer to the following instructions for your post-procedure care. Your surgeon or physician assistant will discuss any changes with you.  Activity  You may drive the day following your surgery, if you are comfortable and no longer taking prescription pain medication. Resume full activity as the soreness in your incision resolves.  Bathing/Showering  You may shower after you go home. Keep your incision dry for 48 hours. Do not soak in a bathtub, hot tub, or swim until the incision heals completely. You may not shower if you have a hemodialysis catheter.  Incision Care  Clean your incision with mild soap and water after 48 hours. Pat the area dry with a clean towel. You do not need a bandage unless otherwise instructed. Do not apply any ointments or creams to your incision. You may have skin glue on your incision. Do not peel it off. It will come off on its own in about one week. Your arm may swell a bit after surgery. To reduce swelling use pillows to elevate your arm so it is above your heart. Your doctor will tell you if you need to lightly wrap your arm with an ACE bandage.  Diet  Resume your normal diet. There are not special food restrictions following this procedure. In order to heal from your surgery, it is CRITICAL to get adequate nutrition. Your body requires vitamins, minerals, and protein. Vegetables are the best source of vitamins and minerals. Vegetables also provide the perfect balance of protein. Processed food has little nutritional value, so try to avoid this.  Medications  Resume taking all of your medications. If your incision is causing pain, you may take over-the counter pain relievers such as acetaminophen (Tylenol). If you were prescribed a stronger pain medication, please be aware these medications can cause nausea and constipation. Prevent  nausea by taking the medication with a snack or meal. Avoid constipation by drinking plenty of fluids and eating foods with high amount of fiber, such as fruits, vegetables, and grains.  Do not take Tylenol if you are taking prescription pain medications.  Follow up Your surgeon may want to see you in the office following your access surgery. If so, this will be arranged at the time of your surgery.  Please call us immediately for any of the following conditions:  . Increased pain, redness, drainage (pus) from your incision site . Fever of 101 degrees or higher . Severe or worsening pain at your incision site . Hand pain or numbness. .  Reduce your risk of vascular disease:  . Stop smoking. If you would like help, call [facility] at 1-800-QUIT-NOW (970) 385-6526) or [place] at 585-122-7475  . Manage your cholesterol . Maintain a desired weight . Control your diabetes . Keep your blood pressure down  Dialysis  It will take several weeks to several months for your new dialysis access to be ready for use. Your surgeon will determine when it is okay to use it. Your nephrologist will continue to direct your dialysis. You can continue to use your Permcath until your new access is ready for use.   11/25/2019 Edward [name] [id number] 18-Sep-1970  Surgeon(s): Early, Arvilla Meres, MD  Procedure(s): LEFT ARM Brachiocephalic  ARTERIOVENOUS (AV) FISTULA CREATION INSERTION OF Righ Internal Jugular DIALYSIS CATHETER   May stick graft immediately   May stick graft on designated area only:   X Do not  stick fistula for 12 weeks    If you have any questions, please call the office at [phone].

## 2019-11-25 NOTE — Progress Notes (Signed)
[facility] Progress Note  Subjective: doing well no c/o  Vitals:   11/25/19 0904 11/25/19 1039 11/25/19 1349 11/25/19 1404  BP: (!) 155/105 (!) 134/100 (!) 151/95 (!) 152/92  Pulse: 97 89 80 80  Resp: 19  20 15   Temp: 97.8 F (36.6 C)  97.8 F (36.6 C) 98.2 F (36.8 C)  TempSrc: Oral     SpO2: 100% 98% 100% 100%  Weight:  109.4 kg    Height:  6\' 1"  (1.854 m)      Exam: Gen alert no distress No jvd or bruits Chest clear bilat to bases RRR no MRG Abd soft ntnd no mass or ascites +bs MS R BKA, L TMA Ext 1-2+ L pretib edema Neuro is alert, Ox 3 , nf, no asterixis    Home meds:  - lasix 80 bid/ hydralazine 75 tid/ zaroxolyn 2.5 weekly/ metoprolol xl 50  - janumet 50-1000 qd/ insulin lantus 20u bid/ 70-30 insulin 20u bid  - brilinta 90 bid/ aspirin 81/ lipitor 80/ imdur 30 qd/ zetia 10  - xanax 0.25 tid prn  - prn's/ vitamins/ supplements    CXR - mild pulm edema     EKG - brady HR 40- 50's, no P waves, junctional vs afib   Assessment/ Plan: 1. Syncope/ hyperkalemia/ bradycardia - resolved w HD 2. Renal failure - new esrd patient. Baseline creat 2.5 last yr, creat here 5.2 w/ uremic symptoms x 2 mos. Started HD, has temp cath , VVS planning TDC/ AVF surgery today. Will do HD tomorrow then can dc , he has been accepted for OP HD to start next Tuesday.  3. Bradycardia - resolved 4. HTN - getting hydral/ metoprolol, lowered dosing yesterday, BP's good 5. DM2 w/ complications 6. PAD - w/ R BKA and L TMA   Edward [name] 11/25/2019, 3:16 PM   Recent Labs  Lab 11/23/19 0313 11/23/19 0313 11/24/19 0536 11/25/19 0328  K 4.5   < > 4.6 3.9  BUN 48*   < > 54* 29*  CREATININE 4.45*   < > 4.75* 3.02*  CALCIUM 8.2*   < > 7.4* 7.4*  HGB 13.1  --   --  13.2   < > = values in this interval not displayed.   Inpatient medications: . Chlorhexidine Gluconate Cloth  6 each Topical Q0600  . Chlorhexidine Gluconate Cloth  6 each Topical Q0600  . [START ON  11/26/2019] Chlorhexidine Gluconate Cloth  6 each Topical Q0600  . heparin  5,000 Units Subcutaneous Q8H  . hydrALAZINE  25 mg Oral Q8H  . insulin aspart  0-9 Units Subcutaneous TID WC  . mouth rinse  15 mL Mouth Rinse BID  . metoprolol tartrate  12.5 mg Oral BID  . pantoprazole  40 mg Oral Daily  . ticagrelor  90 mg Oral BID   . ceFAZolin     acetaminophen, ALPRAZolam, ondansetron (ZOFRAN) IV, oxyCODONE-acetaminophen, sodium chloride flush

## 2019-11-25 NOTE — Progress Notes (Signed)
Patient has been accepted for OP HD treatment at [facility] clinic on a TTS schedule with a seat time of 12:50pm. He needs to arrive to his appointments at 12:30pm. If we are planning for a Saturday start date in the clinic, patient will need to sign intake paperwork Friday prior. If patient states on a Tuesday or Thursday in the clinic, he will need to arrive at 11:50am in order to sign consents prior to his first OP HD treatment.  Patient is in a procedure at this time. Navigator has notified Nephrologist and PA and will follow up with patient at a later time.  Alphonzo Cruise, [place] [facility] Navigator [phone]

## 2019-11-25 NOTE — Anesthesia Postprocedure Evaluation (Signed)
Anesthesia Post Note  Patient: Edward [name]  Procedure(s) Performed: LEFT ARM Brachiocephalic  ARTERIOVENOUS (AV) FISTULA CREATION (Left Arm Upper) INSERTION OF Righ Internal Jugular DIALYSIS CATHETER (N/A Neck)     Patient location during evaluation: PACU Anesthesia Type: MAC Level of consciousness: awake and alert Pain management: pain level controlled Vital Signs Assessment: post-procedure vital signs reviewed and stable Respiratory status: spontaneous breathing, nonlabored ventilation, respiratory function stable and patient connected to nasal cannula oxygen Cardiovascular status: stable and blood pressure returned to baseline Postop Assessment: no apparent nausea or vomiting Anesthetic complications: no    Last Vitals:  Vitals:   11/25/19 1404 11/25/19 1615  BP: (!) 152/92 (!) 161/108  Pulse: 80 91  Resp: 15 18  Temp: 36.8 C 36.8 C  SpO2: 100% 98%    Last Pain:  Vitals:   11/25/19 1935  TempSrc:   PainSc: 8                  Aleli Navedo

## 2019-11-25 NOTE — Anesthesia Preprocedure Evaluation (Signed)
Anesthesia Evaluation  Patient identified by MRN, date of birth, ID band Patient awake    Reviewed: Allergy & Precautions, NPO status , Patient's Chart, lab work & pertinent test results  Airway Mallampati: II  TM Distance: >3 FB     Dental   Pulmonary Current Smoker and Patient abstained from smoking.,    breath sounds clear to auscultation       Cardiovascular hypertension, + CAD, + Peripheral Vascular Disease and +CHF   Rhythm:Regular Rate:Normal  ECHO 4/21 Left ventricular ejection fraction, by estimation, is 40 to 45%. The  left ventricle has mildly decreased function. The left ventricle  demonstrates regional wall motion abnormalities (see scoring  diagram/findings for description). There is mild  concentric left ventricular hypertrophy. Indeterminate diastolic filling  due to E-A fusion. There is severe hypokinesis of the left ventricular,  entire apical segment.    Neuro/Psych PSYCHIATRIC DISORDERS Anxiety Depression negative neurological ROS     GI/Hepatic negative GI ROS, Neg liver ROS,   Endo/Other  diabetes, Type 2, Insulin Dependent  Renal/GU Renal Insufficiency and CRFRenal disease     Musculoskeletal  (+) Arthritis ,   Abdominal   Peds  Hematology  (+) Blood dyscrasia, anemia ,   Anesthesia Other Findings   Reproductive/Obstetrics                             Lab Results  Component Value Date   WBC 4.8 11/25/2019   HGB 13.2 11/25/2019   HCT 40.6 11/25/2019   MCV 84.8 11/25/2019   PLT 211 11/25/2019   Lab Results  Component Value Date   CREATININE 3.02 (H) 11/25/2019   BUN 29 (H) 11/25/2019   NA 138 11/25/2019   K 3.9 11/25/2019   CL 103 11/25/2019   CO2 26 11/25/2019    Anesthesia Physical  Anesthesia Plan  ASA: III  Anesthesia Plan: MAC   Post-op Pain Management:    Induction: Intravenous  PONV Risk Score and Plan: 2 and Ondansetron, Treatment may  vary due to age or medical condition and Dexamethasone  Airway Management Planned: Nasal Cannula, Simple Face Mask and Mask  Additional Equipment: None  Intra-op Plan:   Post-operative Plan:   Informed Consent: I have reviewed the patients History and Physical, chart, labs and discussed the procedure including the risks, benefits and alternatives for the proposed anesthesia with the patient or authorized representative who has indicated his/her understanding and acceptance.     Dental advisory given  Plan Discussed with: CRNA and Surgeon  Anesthesia Plan Comments:         Anesthesia Quick Evaluation

## 2019-11-25 NOTE — Anesthesia Procedure Notes (Signed)
Procedure Name: MAC Date/Time: 11/25/2019 11:50 AM Performed by: Neldon Newport, CRNA Pre-anesthesia Checklist: Timeout performed, Patient being monitored, Suction available, Emergency Drugs available and Patient identified Oxygen Delivery Method: Simple face mask

## 2019-11-25 NOTE — Interval H&P Note (Signed)
History and Physical Interval Note:  11/25/2019 11:16 AM  Edward [name]  has presented today for surgery, with the diagnosis of END STAGE RENAL DISEASE.  The various methods of treatment have been discussed with the patient and family. After consideration of risks, benefits and other options for treatment, the patient has consented to  Procedure(s): ARTERIOVENOUS (AV) FISTULA CREATION VERSES ARTERIOVENOUS GRAFT (Left) INSERTION OF DIALYSIS CATHETER (N/A) as a surgical intervention.  The patient's history has been reviewed, patient examined, no change in status, stable for surgery.  I have reviewed the patient's chart and labs.  Questions were answered to the patient's satisfaction.     [name]

## 2019-11-26 DIAGNOSIS — N178 Other acute kidney failure: Secondary | ICD-10-CM

## 2019-11-26 DIAGNOSIS — R0600 Dyspnea, unspecified: Secondary | ICD-10-CM

## 2019-11-26 DIAGNOSIS — Z992 Dependence on renal dialysis: Secondary | ICD-10-CM

## 2019-11-26 DIAGNOSIS — I482 Chronic atrial fibrillation, unspecified: Secondary | ICD-10-CM

## 2019-11-26 DIAGNOSIS — N184 Chronic kidney disease, stage 4 (severe): Secondary | ICD-10-CM

## 2019-11-26 DIAGNOSIS — I739 Peripheral vascular disease, unspecified: Secondary | ICD-10-CM

## 2019-11-26 DIAGNOSIS — N186 End stage renal disease: Secondary | ICD-10-CM

## 2019-11-26 LAB — CBC
HCT: 44.8 % (ref 39.0–52.0)
Hemoglobin: 13.9 g/dL (ref 13.0–17.0)
MCH: 26.8 pg (ref 26.0–34.0)
MCHC: 31 g/dL (ref 30.0–36.0)
MCV: 86.5 fL (ref 80.0–100.0)
Platelets: 229 10*3/uL (ref 150–400)
RBC: 5.18 MIL/uL (ref 4.22–5.81)
RDW: 13.6 % (ref 11.5–15.5)
WBC: 5.4 10*3/uL (ref 4.0–10.5)
nRBC: 0 % (ref 0.0–0.2)

## 2019-11-26 LAB — GLUCOSE, CAPILLARY
Glucose-Capillary: 203 mg/dL — ABNORMAL HIGH (ref 70–99)
Glucose-Capillary: 158 mg/dL — ABNORMAL HIGH (ref 70–99)
Glucose-Capillary: 140 mg/dL — ABNORMAL HIGH (ref 70–99)

## 2019-11-26 LAB — RENAL FUNCTION PANEL
Albumin: 2.2 g/dL — ABNORMAL LOW (ref 3.5–5.0)
Anion gap: 9 (ref 5–15)
BUN: 36 mg/dL — ABNORMAL HIGH (ref 6–20)
CO2: 23 mmol/L (ref 22–32)
Calcium: 8 mg/dL — ABNORMAL LOW (ref 8.9–10.3)
Chloride: 101 mmol/L (ref 98–111)
Creatinine, Ser: 3.53 mg/dL — ABNORMAL HIGH (ref 0.61–1.24)
GFR calc Af Amer: 22 mL/min — ABNORMAL LOW (ref >60)
GFR calc non Af Amer: 19 mL/min — ABNORMAL LOW (ref >60)
Glucose, Bld: 193 mg/dL — ABNORMAL HIGH (ref 70–99)
Phosphorus: 5 mg/dL — ABNORMAL HIGH (ref 2.5–4.6)
Potassium: 4.9 mmol/L (ref 3.5–5.1)
Sodium: 133 mmol/L — ABNORMAL LOW (ref 135–145)

## 2019-11-26 MED ORDER — HYDRALAZINE HCL 25 MG PO TABS: 25.0000 mg | ORAL_TABLET | Freq: Three times a day (TID) | ORAL | 0 refills | Status: DC

## 2019-11-26 MED ORDER — PANTOPRAZOLE SODIUM 40 MG PO TBEC: 40.0000 mg | DELAYED_RELEASE_TABLET | Freq: Every day | ORAL | 0 refills | Status: DC

## 2019-11-26 MED ORDER — HEPARIN SODIUM (PORCINE) 1000 UNIT/ML DIALYSIS
2000.0000 [IU] | INTRAMUSCULAR | Status: DC | PRN
  Administered 2019-11-26: 2000 [IU] via INTRAVENOUS_CENTRAL

## 2019-11-26 MED ORDER — METOPROLOL TARTRATE 25 MG PO TABS: 12.5000 mg | ORAL_TABLET | Freq: Two times a day (BID) | ORAL | 0 refills | Status: DC

## 2019-11-26 MED ORDER — HEPARIN SODIUM (PORCINE) 1000 UNIT/ML IJ SOLN
INTRAMUSCULAR | Status: AC
  Filled 2019-11-26: qty 4

## 2019-11-26 MED ORDER — HEPARIN SODIUM (PORCINE) 1000 UNIT/ML IJ SOLN
INTRAMUSCULAR | Status: AC
  Filled 2019-11-26: qty 6

## 2019-11-26 MED FILL — hydrALAZINE HCL 25 MG TABS: 25 | 30 days supply | Qty: 90 | Fill #0

## 2019-11-26 MED FILL — PANTOPRAZOLE SOD DR 40 MG T: 40 | 30 days supply | Qty: 30 | Fill #0

## 2019-11-26 MED FILL — METOPROLOL TARTRATE 25 MG T: 25 | 30 days supply | Qty: 30 | Fill #0

## 2019-11-26 NOTE — Progress Notes (Signed)
Subjective:  No cos , for HD today then plans for OP HD start Next Tues as dw Dr Jonnie Finner  [facility]  Objective Vital signs in last 24 hours: Vitals:   11/25/19 1615 11/25/19 2038 11/26/19 0522 11/26/19 0822  BP: (!) 161/108 (!) 143/91 (!) 143/89 (!) 153/98  Pulse: 91 92 80 77  Resp: 18 19 19 18   Temp: 98.3 F (36.8 C) 98.5 F (36.9 C) 98.4 F (36.9 C) 97.8 F (36.6 C)  TempSrc: Oral Oral Oral Oral  SpO2: 98% 99% 100% 100%  Weight:      Height:       Weight change: -2.9 kg  Physical Exam: General: Alert NAD  Heart: RRR , no rub Lungs: CTA  Abdomen: BS pos. Soft NT, ND Extremities: R BKA , LTMA  Trace pedal edema Left left Extrem . Dialysis Access: Pos bruit LUA AVF , R IJ Pcath    Problem/Plan: 1.  Syncope/ hyperkalemia/ bradycardia - resolved w HD 2. Renal failure - new esrd patient.( followed pre HD Dr Hollie Salk ) Baseline creat 2.5 last yr, creat here 5.2 w/ uremic symptoms x 2 mos. Started HD, has temp cath , VVS 4/08 =Temp cath to [facility] cath R IJ and [facility] AVF surgery , HD today  then can dc , he has been accepted for OP HD to start next Tuesday.  3. Bradycardia - resolved 4. HTN - getting hydral/ metoprolol, lowered dosing ,  Taper down meds at op kid center as able with edw decr for 2 l uf today , wts ? With amputation  attempt stand wts  If able with  hd today   5. Anemia of ESR= hgb 13. No esa needs 6. Sec Hyperparathyroidism = Needs pth level as op ,and  phos fu no binders or vit d currently . 7. DM2 w/ complications\ 8. PAD - w/ R BKA and L TMA  Ernest Haber, PA-C [facility] [phone] 11/26/2019,11:57 AM  LOS: 4 days   Labs: Basic Metabolic Panel: Recent Labs  Lab 11/23/19 0313 11/24/19 0536 11/25/19 0328  NA 136 135 138  K 4.5 4.6 3.9  CL 99 102 103  CO2 22 22 26   GLUCOSE 133* 218* 203*  BUN 48* 54* 29*  CREATININE 4.45* 4.75* 3.02*  CALCIUM 8.2* 7.4* 7.4*   Liver Function Tests: Recent Labs  Lab 11/22/19 0411  AST 111*  ALT 75*   ALKPHOS 63  BILITOT 0.7  PROT 6.3*  ALBUMIN 2.3*   No results for input(s): LIPASE, AMYLASE in the last 168 hours. No results for input(s): AMMONIA in the last 168 hours. CBC: Recent Labs  Lab 11/22/19 0411 11/23/19 0313 11/25/19 0328  WBC 8.5 7.3 4.8  NEUTROABS 6.0  --  2.5  HGB 13.0 13.1 13.2  HCT 41.5 41.4 40.6  MCV 85.6 83.8 84.8  PLT 247 211 211   Cardiac Enzymes: No results for input(s): CKTOTAL, CKMB, CKMBINDEX, TROPONINI in the last 168 hours. CBG: Recent Labs  Lab 11/25/19 1351 11/25/19 1612 11/25/19 2140 11/26/19 0625 11/26/19 1112  GLUCAP 167* 147* 236* 203* 158*    Studies/Results: DG CHEST PORT 1 VIEW  Result Date: 11/25/2019 CLINICAL DATA:  49 year old male status post right IJ dialysis catheter placement. EXAM: PORTABLE CHEST 1 VIEW COMPARISON:  Portable chest 11/22/2019 and earlier. FINDINGS: Portable AP semi upright view at 1406 hours. Dual lumen right IJ catheter placed with tips at the level of the cavoatrial junction. The previously seen right IJ central line has  been removed. Stable cardiac size and mediastinal contours. No pneumothorax. Pulmonary vascularity has increased since 11/22/2019 but otherwise stable ventilation. Mild cardiomegaly. Visualized tracheal air column is within normal limits. IMPRESSION: 1. Dual lumen right IJ catheter placed with tips at the level of the cavoatrial junction. No adverse features. 2. Increased pulmonary vascularity since 11/22/2019. Electronically Signed   By: Genevie Ann M.D.   On: 11/25/2019 15:56   DG Fluoro Guide CV Line-No Report  Result Date: 11/25/2019 Fluoroscopy was utilized by the requesting physician.  No radiographic interpretation.   Medications:  . Chlorhexidine Gluconate Cloth  6 each Topical Q0600  . Chlorhexidine Gluconate Cloth  6 each Topical Q0600  . Chlorhexidine Gluconate Cloth  6 each Topical Q0600  . heparin  5,000 Units Subcutaneous Q8H  . hydrALAZINE  25 mg Oral Q8H  . insulin aspart  0-9  Units Subcutaneous TID WC  . mouth rinse  15 mL Mouth Rinse BID  . metoprolol tartrate  12.5 mg Oral BID  . pantoprazole  40 mg Oral Daily  . ticagrelor  90 mg Oral BID

## 2019-11-26 NOTE — Progress Notes (Signed)
[facility] [place]  Subjective  - Doing well without new complaints.   Objective (!) 143/89 80 98.4 F (36.9 C) (Oral) 19 100%  Intake/Output Summary (Last 24 hours) at 11/26/2019 0717 Last data filed at 11/26/2019 0600 Gross per 24 hour  Intake 1333.09 ml  Output 210 ml  Net 1123.09 ml    Right TDC in place Left AC incision healing well.  Difficult to palpate thrill, but good doppler thrill in cephalic vein. Lungs non labored breathing  Assessment/Planning: POD # 1 PROCEDURE: #1 conversion of right IJ temporary cath to tunneled catheter, #2 left brachiocephalic AV fistula  I will schedule f/u visit for 5-6 weeks with duplex.  Do not use fistula for 12 weeks.  Roxy Horseman 11/26/2019 7:17 AM --  Laboratory Lab Results: Recent Labs    11/25/19 0328  WBC 4.8  HGB 13.2  HCT 40.6  PLT 211   BMET Recent Labs    11/24/19 0536 11/25/19 0328  NA 135 138  K 4.6 3.9  CL 102 103  CO2 22 26  GLUCOSE 218* 203*  BUN 54* 29*  CREATININE 4.75* 3.02*  CALCIUM 7.4* 7.4*    COAG Lab Results  Component Value Date   INR 1.04 08/04/2018   INR 1.01 05/15/2018   INR 1.46 08/12/2011   No results found for: PTT

## 2019-11-26 NOTE — Discharge Summary (Signed)
Physician Discharge Summary  Edward [name] IZT:[id number] DOB: 1971/04/04 DOA: 11/21/2019  PCP: [name], MD  Admit date: 11/21/2019 Discharge date: 11/26/2019  Recommendations for Outpatient Follow-up:  1. Discharge to home 2. Follow up with Dialysis center to sign paperwork and for HD. 3. Follow up with PCP in 7-10 days 4. Follow up with nephrology as directed. 5. Follow up with atrial fibrillation clinic as directed.  Follow-up Information    Early, Arvilla Meres, MD.   Specialties: Vascular Surgery, Cardiology Why: 4-6 weeks. The office will call the patient with an appointment (sent) Contact information: [place] [address] [phone]          Discharge Diagnoses: Principal diagnosis is #1 1. Acute on chronic CKD IV with new ESRD requiring Dialysis 2. Hyperkalemia 3. Syncope 4. HFmrEF 5. Atrial Fibrillation 6. Hypertension 7. DM II 8. Transaminase elevation  Discharge Condition: Fair  Disposition: home  Diet recommendation: Heart healthy with modified carbohydrates  Filed Weights   11/25/19 1039 11/26/19 1225 11/26/19 1533  Weight: 109.4 kg 114.2 kg 111.6 kg    History of present illness:  Edward [name] is a 49 y.o male with HTN, HFmrEF (LVEF 45-50%), CAD s/p DES to Cx in 2019, and DM who presented to the ED after syncopal episode. For the last several weeks he has had a significanbt amount of trouble with mucus drainage that causes him to be [facility] and nauseous. Yesterday he became extremely nauseous, cold, and diaphoretic. He subsequently syncopized. His son subsequently called EMS and he was brought to the ED  Over the last several months he has had issues with a bitter taste in his mouth, progressive DOE (but feels his mobility is more limited by his right BKA), + orthopnea, pruritis, and diffuse weakness. He has not urinated since the AM of 4/4. He knew that he had issues with his kidneys and has had conversations about HD.    Hospital Course:  Edward [name] is a 49 y.o male with HTN, HFmrEF (LVEF 45-50%), CAD s/p DES to Cx in 2019, and DM who presented to the ED after syncopal episode. He was found to be hypervolemic with acute on chronic renal failure with hyperkalemia. Nephrology was consulted for possible HD and PCCM was consulted for possible admission.   The patient is a 49 yr old man who presented to [facility] ED with complaints of shortness of breath, nausea, diaphoresis, and syncope x 1. He had not urinated in 4 days, and was found to be in acute renal failure, hyperkalemia, and uremic. He was also volume overloaded. A temporary dialysis catheter was placed, and the patient was dialyzed emergently in the ICU on 11/22/2019. The patient was also found to be in new onset atrial fibrillation. The patient was restarted on the hydralazine, metoprolol, and Brillinta after dialysis. Vascular surgery was consulted to place a tunneled catheter and placement of a left AV fistula or graft. This will be done on 11/25/2019.   The patient was started on apixaban by the ED.   Echocardiogram was performed on 11/23/2019. EF was 40-45% with mildly decreased function and regional wall motion abnormalities. There is concentric left ventricular hypertrophy. There is severe hypokinesis of the left ventricular, entire apical segment. There was indeterminate diastolic filling due to E-A fusion. RV systolic function is normal.  Left atrium was moderately dilated.   EKG demonstrated NSR with 1st degree AV Block, Age indeterminate anterior infarct, left Axis deviation, abnormal EKG.   The patient was transferred out  of the ICU on 11/23/2019 to the hospitalist service. The patient underwent placement of tunnelled catheter and left AV fistula this morning. The patient has been set up for outpatient dialysis.  He will be discharged to home in fair condition.   Today's assessment: S: The patient is resting comfortably. No new complaints. O: Vitals:  Vitals:   11/26/19  1530 11/26/19 1612  BP: (!) 128/91 (!) 146/94  Pulse: 66 91  Resp: 16 18  Temp: 98 F (36.7 C) 98.2 F (36.8 C)  SpO2: 99% 99%   Exam:  Constitutional:   The patient is awake, alert, and oriented x 3. No acute distress. Respiratory:   No increased work of breathing.  No wheezes, rales, or rhonchi  No tactile fremitus Cardiovascular:   Regular rate and rhythm  No murmurs, ectopy, or gallups.  No lateral PMI. No thrills. Abdomen:   Abdomen is soft, non-tender, non-distended  No hernias, masses, or organomegaly  Normoactive bowel sounds.  Musculoskeletal:   No cyanosis, clubbing, or edema Skin:   No rashes, lesions, ulcers  palpation of skin: no induration or nodules Neurologic:   CN 2-12 intact  Sensation all 4 extremities intact Psychiatric:   Mental status ? Mood, affect appropriate ? Orientation to person, place, time   judgment and insight appear intact   Discharge Instructions  Discharge Instructions    Activity as tolerated - No restrictions   Complete by: As directed    Amb referral to AFIB Clinic   Complete by: As directed    Call MD for:  difficulty breathing, headache or visual disturbances   Complete by: As directed    Diet - low sodium heart healthy   Complete by: As directed    Discharge instructions   Complete by: As directed    Discharge to home Follow up with Dialysis center to sign paperwork and for HD. Follow up with PCP in 7-10 days Follow up with nephrology as directed. Follow up with atrial fibrillation clinic as directed.   Increase activity slowly   Complete by: As directed      Allergies as of 11/26/2019   No Known Allergies     Medication List    STOP taking these medications   ALPRAZolam 0.25 MG tablet Commonly known as: XANAX   aspirin 81 MG EC tablet   atorvastatin 80 MG tablet Commonly known as: LIPITOR   DULoxetine 30 MG capsule Commonly known as: Cymbalta   ezetimibe 10 MG tablet Commonly  known as: ZETIA   furosemide 40 MG tablet Commonly known as: LASIX   isosorbide mononitrate 30 MG 24 hr tablet Commonly known as: IMDUR   metolazone 2.5 MG tablet Commonly known as: ZAROXOLYN   metoprolol succinate 50 MG 24 hr tablet Commonly known as: TOPROL-XL   sitaGLIPtin-metformin 50-1000 MG tablet Commonly known as: JANUMET     TAKE these medications   acetaminophen 325 MG tablet Commonly known as: TYLENOL Take 1-2 tablets (325-650 mg total) by mouth every 6 (six) hours as needed for mild pain (pain score 1-3 or temp > 100.5).   blood glucose meter kit and supplies Dispense based on patient and insurance preference. Use up to four times daily as directed. (FOR ICD-9 250.00, 250.01).   hydrALAZINE 25 MG tablet Commonly known as: APRESOLINE Take 1 tablet (25 mg total) by mouth every 8 (eight) hours. What changed:   medication strength  how much to take   insulin aspart protamine - aspart (70-30) 100 UNIT/ML FlexPen Commonly known  as: NovoLOG Mix 70/30 FlexPen Inject 0.15 mLs (15 Units total) into the skin 2 (two) times daily with a meal. What changed:   how much to take  when to take this  reasons to take this   Insulin Pen Needle 31G X 5 MM Misc Use daily to inject insulin as instructed   Lantus SoloStar 100 UNIT/ML Solostar Pen Generic drug: insulin glargine Inject 20 Units into the skin 2 (two) times daily.   metoprolol tartrate 25 MG tablet Commonly known as: LOPRESSOR Take 0.5 tablets (12.5 mg total) by mouth 2 (two) times daily.   multivitamin with minerals Tabs tablet Take 1 tablet by mouth daily.   oxyCODONE-acetaminophen 7.5-325 MG tablet Commonly known as: Percocet Take 1 tablet by mouth every 4 (four) hours as needed for severe pain.   pantoprazole 40 MG tablet Commonly known as: PROTONIX Take 1 tablet (40 mg total) by mouth daily.   ticagrelor 90 MG Tabs tablet Commonly known as: BRILINTA Take 90 mg by mouth 2 (two) times daily.        No Known Allergies  The results of significant diagnostics from this hospitalization (including imaging, microbiology, ancillary and laboratory) are listed below for reference.    Significant Diagnostic Studies: DG Chest 2 View  Result Date: 11/22/2019 CLINICAL DATA:  Shortness of breath EXAM: CHEST - 2 VIEW COMPARISON:  12/21/2018 FINDINGS: Cardiomegaly with symmetric interstitial and airspace opacities. There is fissure thickening and small pleural effusions. The pattern is similar but less extensive than on prior. IMPRESSION: CHF pattern. Electronically Signed   By: Monte Fantasia M.D.   On: 11/22/2019 03:57   DG CHEST PORT 1 VIEW  Result Date: 11/25/2019 CLINICAL DATA:  49 year old male status post right IJ dialysis catheter placement. EXAM: PORTABLE CHEST 1 VIEW COMPARISON:  Portable chest 11/22/2019 and earlier. FINDINGS: Portable AP semi upright view at 1406 hours. Dual lumen right IJ catheter placed with tips at the level of the cavoatrial junction. The previously seen right IJ central line has been removed. Stable cardiac size and mediastinal contours. No pneumothorax. Pulmonary vascularity has increased since 11/22/2019 but otherwise stable ventilation. Mild cardiomegaly. Visualized tracheal air column is within normal limits. IMPRESSION: 1. Dual lumen right IJ catheter placed with tips at the level of the cavoatrial junction. No adverse features. 2. Increased pulmonary vascularity since 11/22/2019. Electronically Signed   By: Genevie Ann M.D.   On: 11/25/2019 15:56   DG CHEST PORT 1 VIEW  Result Date: 11/22/2019 CLINICAL DATA:  Displacement of central venous catheter. EXAM: PORTABLE CHEST 1 VIEW COMPARISON:  Same day. FINDINGS: Stable cardiomegaly is noted with central pulmonary vascular congestion. Mild bibasilar opacities are noted concerning for edema or subsegmental atelectasis. No pneumothorax is noted. Interval placement of right internal jugular catheter with distal tip in expected  position of the SVC. Bony thorax is unremarkable. IMPRESSION: Interval placement of right internal jugular catheter with distal tip in expected position of the SVC. No pneumothorax is noted. Stable cardiomegaly with central pulmonary vascular congestion. Electronically Signed   By: Marijo Conception M.D.   On: 11/22/2019 10:05   DG Fluoro Guide CV Line-No Report  Result Date: 11/25/2019 Fluoroscopy was utilized by the requesting physician.  No radiographic interpretation.   ECHOCARDIOGRAM COMPLETE  Result Date: 11/23/2019    ECHOCARDIOGRAM REPORT   Patient Name:   Edward [name] Date of Exam: 11/23/2019 Medical Rec #:  [id number]      Height:       73.0 in  Accession #:    [id number]     Weight:       244.0 lb Date of Birth:  Nov 26, 1970      BSA:          2.342 m Patient Age:    89 years       BP:           135/81 mmHg Patient Gender: M              HR:           96 bpm. Exam Location:  Inpatient Procedure: 2D Echo Indications:    syncope 780.2  History:        Patient has prior history of Echocardiogram examinations, most                 recent 12/22/2018. CHF, CAD; Risk Factors:Hypertension, Diabetes                 and Current Smoker.  Sonographer:    Johny Chess Referring Phys: [place]  1. Left ventricular ejection fraction, by estimation, is 40 to 45%. The left ventricle has mildly decreased function. The left ventricle demonstrates regional wall motion abnormalities (see scoring diagram/findings for description). There is mild concentric left ventricular hypertrophy. Indeterminate diastolic filling due to E-A fusion. There is severe hypokinesis of the left ventricular, entire apical segment.  2. Right ventricular systolic function is normal. The right ventricular size is normal. Tricuspid regurgitation signal is inadequate for assessing PA pressure.  3. Left atrial size was moderately dilated.  4. The mitral valve is normal in structure. Mild mitral valve regurgitation.  5. The  aortic valve is tricuspid. Aortic valve regurgitation is mild. No aortic stenosis is present.  6. The inferior vena cava is normal in size with greater than 50% respiratory variability, suggesting right atrial pressure of 3 mmHg. Comparison(s): Prior images reviewed side by side. Changes from prior study are noted. The apical wall motion abnormality and the overall left ventricular function is unchanged. The left ventricular inferolateral wall motion abnormality is improved and there is substantial reduction in the severity of mitral insufficiency. FINDINGS  Left Ventricle: Left ventricular ejection fraction, by estimation, is 40 to 45%. The left ventricle has mildly decreased function. The left ventricle demonstrates regional wall motion abnormalities. Severe hypokinesis of the left ventricular, entire apical segment. The left ventricular internal cavity size was normal in size. There is mild concentric left ventricular hypertrophy. Indeterminate diastolic filling due to E-A fusion. Right Ventricle: The right ventricular size is normal. No increase in right ventricular wall thickness. Right ventricular systolic function is normal. Tricuspid regurgitation signal is inadequate for assessing PA pressure. Left Atrium: Left atrial size was moderately dilated. Right Atrium: Right atrial size was normal in size. Pericardium: A small pericardial effusion is present. The pericardial effusion is circumferential. Mitral Valve: The mitral valve is normal in structure. Mild mitral valve regurgitation, with centrally-directed jet. Tricuspid Valve: The tricuspid valve is normal in structure. Tricuspid valve regurgitation is not demonstrated. Aortic Valve: The aortic valve is tricuspid. Aortic valve regurgitation is mild. Aortic regurgitation PHT measures 507 msec. No aortic stenosis is present. Pulmonic Valve: The pulmonic valve was normal in structure. Pulmonic valve regurgitation is not visualized. Aorta: The aortic root and  ascending aorta are structurally normal, with no evidence of dilitation. Venous: The inferior vena cava is normal in size with greater than 50% respiratory variability, suggesting right atrial pressure of 3 mmHg. IAS/Shunts: No  atrial level shunt detected by color flow Doppler.  LEFT VENTRICLE PLAX 2D LVIDd:         5.40 cm LVIDs:         4.30 cm LV PW:         1.20 cm LV IVS:        1.30 cm LVOT diam:     2.30 cm LV SV:         61 LV SV Index:   26 LVOT Area:     4.15 cm  RIGHT VENTRICLE RV S prime:     13.60 cm/s TAPSE (M-mode): 1.6 cm LEFT ATRIUM             Index       RIGHT ATRIUM           Index LA diam:        4.80 cm 2.05 cm/m  RA Area:     20.10 cm LA Vol (A2C):   78.1 ml 33.35 ml/m RA Volume:   60.70 ml  25.92 ml/m LA Vol (A4C):   98.7 ml 42.15 ml/m LA Biplane Vol: 88.6 ml 37.84 ml/m  AORTIC VALVE LVOT Vmax:   101.00 cm/s LVOT Vmean:  67.000 cm/s LVOT VTI:    0.147 m AI PHT:      507 msec  AORTA Ao Root diam: 3.60 cm  SHUNTS Systemic VTI:  0.15 m Systemic Diam: 2.30 cm Dani Gobble Croitoru MD Electronically signed by Sanda Klein MD Signature Date/Time: 11/23/2019/10:14:48 AM    Final    VAS [place] UPPER EXT VEIN MAPPING (PRE-OP AVF)  Result Date: 11/24/2019 UPPER EXTREMITY VEIN MAPPING  Indications: Pre-access. Performing Technologist: Abram Sander RVS  Examination Guidelines: A complete evaluation includes B-mode imaging, spectral Doppler, color Doppler, and power Doppler as needed of all accessible portions of each vessel. Bilateral testing is considered an integral part of a complete examination. Limited examinations for reoccurring indications may be performed as noted. +-----------------+-------------+----------+--------+ Right Cephalic   Diameter (cm)Depth (cm)Findings +-----------------+-------------+----------+--------+ Shoulder             0.29        0.28            +-----------------+-------------+----------+--------+ Prox upper arm       0.27        0.47             +-----------------+-------------+----------+--------+ Mid upper arm        0.26        0.29            +-----------------+-------------+----------+--------+ Dist upper arm       0.23        0.34            +-----------------+-------------+----------+--------+ Antecubital fossa    0.35        0.21            +-----------------+-------------+----------+--------+ Prox forearm         0.25        0.39            +-----------------+-------------+----------+--------+ Mid forearm          0.18        0.33            +-----------------+-------------+----------+--------+ Dist forearm         0.17        0.39            +-----------------+-------------+----------+--------+ Wrist  0.11        0.27            +-----------------+-------------+----------+--------+ +-----------------+-------------+----------+--------+ Right Basilic    Diameter (cm)Depth (cm)Findings +-----------------+-------------+----------+--------+ Prox upper arm       0.56        1.00            +-----------------+-------------+----------+--------+ Mid upper arm        0.34        1.29            +-----------------+-------------+----------+--------+ Dist upper arm       0.34        0.93            +-----------------+-------------+----------+--------+ Antecubital fossa    0.49        0.87            +-----------------+-------------+----------+--------+ Prox forearm         0.22        0.36            +-----------------+-------------+----------+--------+ Mid forearm          0.20        0.23            +-----------------+-------------+----------+--------+ Distal forearm       0.28        0.20            +-----------------+-------------+----------+--------+ +-----------------+-------------+----------+--------+ Left Cephalic    Diameter (cm)Depth (cm)Findings +-----------------+-------------+----------+--------+ Shoulder             0.39        0.26             +-----------------+-------------+----------+--------+ Prox upper arm       0.34        0.32            +-----------------+-------------+----------+--------+ Mid upper arm        0.39        0.32            +-----------------+-------------+----------+--------+ Dist upper arm       0.42        0.49            +-----------------+-------------+----------+--------+ Antecubital fossa    0.49        0.32            +-----------------+-------------+----------+--------+ Prox forearm         0.28        0.51   occluded +-----------------+-------------+----------+--------+ Mid forearm          0.30        0.34   occluded +-----------------+-------------+----------+--------+ Dist forearm         0.18        0.25   occluded +-----------------+-------------+----------+--------+ +-----------------+-------------+----------+--------+ Left Basilic     Diameter (cm)Depth (cm)Findings +-----------------+-------------+----------+--------+ Prox upper arm       0.73        1.12            +-----------------+-------------+----------+--------+ Mid upper arm        0.36        1.00            +-----------------+-------------+----------+--------+ Dist upper arm       0.45        0.52            +-----------------+-------------+----------+--------+ Antecubital fossa    0.45        0.36            +-----------------+-------------+----------+--------+  Prox forearm         0.21        0.24            +-----------------+-------------+----------+--------+ Mid forearm          0.22        0.23            +-----------------+-------------+----------+--------+ Distal forearm       0.18        0.24            +-----------------+-------------+----------+--------+ *See table(s) above for measurements and observations.   Diagnosing physician: [name] MD Electronically signed by [name] MD on 11/24/2019 at 8:07:57 AM.    Final     Microbiology: Recent  Results (from the past 240 hour(s))  Respiratory Panel by RT PCR (Flu A&B, Covid) - Nasopharyngeal Swab     Status: None   Collection Time: 11/22/19  8:11 AM   Specimen: Nasopharyngeal Swab  Result Value Ref Range Status   SARS Coronavirus 2 by RT PCR NEGATIVE NEGATIVE Final    Comment: (NOTE) SARS-CoV-2 target nucleic acids are NOT DETECTED. The SARS-CoV-2 RNA is generally detectable in upper respiratoy specimens during the acute phase of infection. The lowest concentration of SARS-CoV-2 viral copies this assay can detect is 131 copies/mL. A negative result does not preclude SARS-Cov-2 infection and should not be used as the sole basis for treatment or other patient management decisions. A negative result may occur with  improper specimen collection/handling, submission of specimen other than nasopharyngeal swab, presence of viral mutation(s) within the areas targeted by this assay, and inadequate number of viral copies (<131 copies/mL). A negative result must be combined with clinical observations, patient history, and epidemiological information. The expected result is Negative. Fact Sheet for Patients:  PinkCheek.be Fact Sheet for Healthcare Providers:  GravelBags.it This test is not yet ap proved or cleared by the [place] FDA and  has been authorized for detection and/or diagnosis of SARS-CoV-2 by FDA under an Emergency Use Authorization (EUA). This EUA will remain  in effect (meaning this test can be used) for the duration of the COVID-19 declaration under Section 564(b)(1) of the Act, 21 U.S.C. section 360bbb-3(b)(1), unless the authorization is terminated or revoked sooner.    Influenza A by PCR NEGATIVE NEGATIVE Final   Influenza B by PCR NEGATIVE NEGATIVE Final    Comment: (NOTE) The Xpert Xpress SARS-CoV-2/FLU/RSV assay is intended as an aid in  the diagnosis of influenza from Nasopharyngeal swab specimens  and  should not be used as a sole basis for treatment. Nasal washings and  aspirates are unacceptable for Xpert Xpress SARS-CoV-2/FLU/RSV  testing. Fact Sheet for Patients: PinkCheek.be Fact Sheet for Healthcare Providers: GravelBags.it This test is not yet approved or cleared by the [place] FDA and  has been authorized for detection and/or diagnosis of SARS-CoV-2 by  FDA under an Emergency Use Authorization (EUA). This EUA will remain  in effect (meaning this test can be used) for the duration of the  Covid-19 declaration under Section 564(b)(1) of the Act, 21  U.S.C. section 360bbb-3(b)(1), unless the authorization is  terminated or revoked. Performed at [facility], [place] [address], [place], [place] [address]   MRSA PCR Screening     Status: None   Collection Time: 11/22/19 11:39 AM   Specimen: Nasopharyngeal  Result Value Ref Range Status   MRSA by PCR NEGATIVE NEGATIVE Final    Comment:  The GeneXpert MRSA Assay (FDA approved for NASAL specimens only), is one component of a comprehensive MRSA colonization surveillance program. It is not intended to diagnose MRSA infection nor to guide or monitor treatment for MRSA infections. Performed at [facility], [place] [address] [place], [place] [address]   Surgical pcr screen     Status: None   Collection Time: 11/24/19  7:45 PM   Specimen: Nasal Mucosa; Nasal Swab  Result Value Ref Range Status   MRSA, PCR NEGATIVE NEGATIVE Final   Staphylococcus aureus NEGATIVE NEGATIVE Final    Comment: (NOTE) The Xpert SA Assay (FDA approved for NASAL specimens in patients 70 years of age and older), is one component of a comprehensive surveillance program. It is not intended to diagnose infection nor to guide or monitor treatment. Performed at [facility], [place] [address] [place], [place] [address]      Labs: Basic Metabolic Panel: Recent Labs    Lab 11/22/19 0411 11/22/19 0411 11/22/19 5465 11/23/19 0313 11/24/19 0536 11/25/19 0328 11/26/19 1151  NA 135   < > 135 136 135 138 133*  K 6.9*   < > 6.3* 4.5 4.6 3.9 4.9  CL 103   < > 105 99 102 103 101  CO2 19*   < > 18* 22 22 26 23   GLUCOSE 144*   < > 164* 133* 218* 203* 193*  BUN 62*   < > 65* 48* 54* 29* 36*  CREATININE 5.32*   < > 5.21* 4.45* 4.75* 3.02* 3.53*  CALCIUM 8.3*   < > 8.1* 8.2* 7.4* 7.4* 8.0*  MG 2.0  --   --   --   --   --   --   PHOS  --   --   --   --   --   --  5.0*   < > = values in this interval not displayed.   Liver Function Tests: Recent Labs  Lab 11/22/19 0411 11/26/19 1151  AST 111*  --   ALT 75*  --   ALKPHOS 63  --   BILITOT 0.7  --   PROT 6.3*  --   ALBUMIN 2.3* 2.2*   No results for input(s): LIPASE, AMYLASE in the last 168 hours. No results for input(s): AMMONIA in the last 168 hours. CBC: Recent Labs  Lab 11/22/19 0411 11/23/19 0313 11/25/19 0328 11/26/19 1230  WBC 8.5 7.3 4.8 5.4  NEUTROABS 6.0  --  2.5  --   HGB 13.0 13.1 13.2 13.9  HCT 41.5 41.4 40.6 44.8  MCV 85.6 83.8 84.8 86.5  PLT 247 211 211 229   Cardiac Enzymes: No results for input(s): CKTOTAL, CKMB, CKMBINDEX, TROPONINI in the last 168 hours. BNP: BNP (last 3 results) Recent Labs    12/21/18 1450 11/22/19 0411  BNP 631.7* 241.7*    ProBNP (last 3 results) Recent Labs    01/25/19 1138 02/09/19 1128 04/30/19 1005  PROBNP 3,702* 3,841* 3,766*    CBG: Recent Labs  Lab 11/25/19 1612 11/25/19 2140 11/26/19 0625 11/26/19 1112 11/26/19 1615  GLUCAP 147* 236* 203* 158* 140*    Active Problems:   Acute renal failure (ARF) (HCC)   Time coordinating discharge: 38 minutes.  Signed:        Caison Hearn, DO [facility]  11/26/2019, 5:37 PM

## 2019-11-29 ENCOUNTER — Telehealth: Payer: Self-pay | Admitting: Nurse Practitioner

## 2019-11-29 ENCOUNTER — Telehealth (HOSPITAL_COMMUNITY): Payer: Self-pay | Admitting: Physician Assistant

## 2019-11-29 NOTE — Telephone Encounter (Signed)
Called and left message for pt to call back.  Need to schedule f/u in 1-2 wks for hosp f/u.

## 2019-11-29 NOTE — Telephone Encounter (Signed)
Transition of care contact from inpatient facility  Date of discharge: 11/26/2019 Date of contact:11/29/2019 Method: Phone Spoke to: Patient  Patient contacted to discuss transition of care from recent inpatient hospitalization. Patient was admitted to [facility] from 11/21/2019 to 11/26/2019 with discharge diagnosis of Acute on chronic CKD IV with new ESRD requiring Dialysis,  Hyperkalemia, AFIB, HFrEF,  Medication changes were reviewed.  Patient will follow up with his/her outpatient HD unit on:[facility]   Other f/u needs include:follow up with PCP, [facility] for follow up of permanent access placement. Follow up with cardiology.

## 2019-11-30 DIAGNOSIS — D689 Coagulation defect, unspecified: Secondary | ICD-10-CM | POA: Insufficient documentation

## 2019-11-30 DIAGNOSIS — L299 Pruritus, unspecified: Secondary | ICD-10-CM | POA: Insufficient documentation

## 2019-11-30 DIAGNOSIS — Z111 Encounter for screening for respiratory tuberculosis: Secondary | ICD-10-CM | POA: Insufficient documentation

## 2019-11-30 DIAGNOSIS — N186 End stage renal disease: Secondary | ICD-10-CM | POA: Insufficient documentation

## 2019-11-30 DIAGNOSIS — D509 Iron deficiency anemia, unspecified: Secondary | ICD-10-CM | POA: Insufficient documentation

## 2019-11-30 DIAGNOSIS — N2581 Secondary hyperparathyroidism of renal origin: Secondary | ICD-10-CM | POA: Insufficient documentation

## 2019-11-30 DIAGNOSIS — R197 Diarrhea, unspecified: Secondary | ICD-10-CM | POA: Insufficient documentation

## 2019-11-30 DIAGNOSIS — R52 Pain, unspecified: Secondary | ICD-10-CM | POA: Insufficient documentation

## 2019-11-30 DIAGNOSIS — M1380 Other specified arthritis, unspecified site: Secondary | ICD-10-CM | POA: Insufficient documentation

## 2019-11-30 DIAGNOSIS — I2510 Atherosclerotic heart disease of native coronary artery without angina pectoris: Secondary | ICD-10-CM | POA: Insufficient documentation

## 2019-11-30 DIAGNOSIS — I4891 Unspecified atrial fibrillation: Secondary | ICD-10-CM | POA: Insufficient documentation

## 2019-11-30 DIAGNOSIS — I129 Hypertensive chronic kidney disease with stage 1 through stage 4 chronic kidney disease, or unspecified chronic kidney disease: Secondary | ICD-10-CM

## 2019-12-02 ENCOUNTER — Ambulatory Visit: Payer: Medicare Other | Admitting: Orthopedic Surgery

## 2019-12-08 ENCOUNTER — Encounter (HOSPITAL_COMMUNITY): Payer: Self-pay | Admitting: Physician Assistant

## 2019-12-08 NOTE — Telephone Encounter (Signed)
Letter mailed to patient.

## 2019-12-29 DIAGNOSIS — E8770 Fluid overload, unspecified: Secondary | ICD-10-CM | POA: Insufficient documentation

## 2019-12-31 ENCOUNTER — Other Ambulatory Visit: Payer: Self-pay | Admitting: *Deleted

## 2019-12-31 DIAGNOSIS — N1832 Chronic kidney disease, stage 3b: Secondary | ICD-10-CM

## 2020-01-04 ENCOUNTER — Ambulatory Visit (HOSPITAL_COMMUNITY)
Admission: RE | Admit: 2020-01-04 | Discharge: 2020-01-04 | Disposition: A | Discharge status: Home / Self Care | Payer: Medicare Other | Source: Ambulatory Visit | Attending: Vascular Surgery | Admitting: Vascular Surgery

## 2020-01-04 ENCOUNTER — Other Ambulatory Visit: Payer: Self-pay

## 2020-01-04 ENCOUNTER — Ambulatory Visit (INDEPENDENT_AMBULATORY_CARE_PROVIDER_SITE_OTHER): Payer: Self-pay | Admitting: Physician Assistant

## 2020-01-04 VITALS — BP 149/93 | HR 81 | Temp 98.0°F | Resp 20 | Ht 73.0 in | Wt 246.0 lb

## 2020-01-04 DIAGNOSIS — N1832 Chronic kidney disease, stage 3b: Secondary | ICD-10-CM

## 2020-01-04 DIAGNOSIS — Z992 Dependence on renal dialysis: Secondary | ICD-10-CM

## 2020-01-04 DIAGNOSIS — N186 End stage renal disease: Secondary | ICD-10-CM

## 2020-01-04 NOTE — Progress Notes (Signed)
    Postoperative Access Visit   History of Present Illness   Edward [name] is a 49 y.o. year old male who presents for postoperative follow-up for: left brachiocephalic AV fistula and right IJ TDC by Dr. Donnetta Hutching on 11/25/19. The patient's wounds are healed.  The patient notes no steal symptoms. He is currently dialyzing via right IJ TDC. He says it is working well. He does have some soreness around the catheter but otherwise no issues   Physical Examination  There were no vitals filed for this visit. There is no height or weight on file to calculate BMI.  left arm Incision is well healed, 2+ radial pulse, hand grip is 5/5, sensation in digits is intact, palpable thrill, bruit can be auscultated. Left hand warm     +------------+----------+-------------+-----------+----------------+  OUTFLOW VEINPSV (cm/s)Diameter (cm)Depth (cm)   Describe    +------------+----------+-------------+-----------+----------------+  Shoulder    77    0.44     0.55            +------------+----------+-------------+-----------+----------------+  Prox UA     62    0.39     0.42            +------------+----------+-------------+-----------+----------------+  Mid UA     65    0.41     0.27            +------------+----------+-------------+-----------+----------------+  Dist UA   160 / 210  0.49 / 0.69 0.38 / 0.35          +------------+----------+-------------+-----------+----------------+  AC Fossa    134    0.39     0.59  competing branch  +------------+----------+-------------+-----------+----------------+    Medical Decision Making   Edward [name] is a 49 y.o. year old male who presents s/p left brachiocephalic AV fistula and right IJ TDC by Dr. Donnetta Hutching on 11/25/19. Fistula is working well and of good depth however needs to mature more. I have encouraged him to exercise his left upper  extremity. He will follow up in 6 week with repeat fistula duplex   Patent is without signs or symptoms of steal syndrome  The patient's access will be ready for use 02/24/20  Follow up in 6 weeks with fistula duplex   Karoline Caldwell, PA-C [facility] [place] Office: [phone]  Clinic MD: Dr. Carlis Abbott

## 2020-01-05 ENCOUNTER — Other Ambulatory Visit: Payer: Self-pay | Admitting: *Deleted

## 2020-01-05 DIAGNOSIS — Z992 Dependence on renal dialysis: Secondary | ICD-10-CM

## 2020-01-14 MED FILL — UNIFINE PENTIPS 31GX3/16: 31G X 5 MM | 90 days supply | Qty: 100 | Fill #0

## 2020-02-18 ENCOUNTER — Ambulatory Visit (HOSPITAL_COMMUNITY)
Admission: RE | Admit: 2020-02-18 | Discharge: 2020-02-18 | Disposition: A | Discharge status: Home / Self Care | Payer: Medicare Other | Source: Ambulatory Visit | Attending: Vascular Surgery | Admitting: Vascular Surgery

## 2020-02-18 ENCOUNTER — Ambulatory Visit (INDEPENDENT_AMBULATORY_CARE_PROVIDER_SITE_OTHER): Payer: Medicare Other | Admitting: Physician Assistant

## 2020-02-18 ENCOUNTER — Other Ambulatory Visit: Payer: Self-pay

## 2020-02-18 VITALS — BP 136/80 | HR 71 | Temp 97.7°F | Resp 20 | Ht 73.0 in | Wt 248.2 lb

## 2020-02-18 DIAGNOSIS — Z992 Dependence on renal dialysis: Secondary | ICD-10-CM | POA: Insufficient documentation

## 2020-02-18 DIAGNOSIS — N186 End stage renal disease: Secondary | ICD-10-CM | POA: Insufficient documentation

## 2020-02-18 DIAGNOSIS — T82898D Other specified complication of vascular prosthetic devices, implants and grafts, subsequent encounter: Secondary | ICD-10-CM

## 2020-02-18 MED ORDER — SODIUM CHLORIDE 0.9% FLUSH
3.0000 mL | Freq: Two times a day (BID) | INTRAVENOUS | Status: DC
Start: 2020-02-18 — End: 2020-09-29

## 2020-02-18 MED ORDER — SODIUM CHLORIDE 0.9 % IV SOLN: 250.0000 mL | INTRAVENOUS | Status: DC | PRN

## 2020-02-18 MED ORDER — SODIUM CHLORIDE 0.9% FLUSH
3.0000 mL | INTRAVENOUS | Status: DC | PRN
Start: 2020-02-18 — End: 2020-09-29

## 2020-02-18 NOTE — Progress Notes (Signed)
POST OPERATIVE DIALYSIS ACCESS OFFICE NOTE    CC:  F/u for dialysis access surgery  HPI:  This is a 49 y.o. male who is s/p left brachiocephalic AV fistula and right IJ TDC by Dr. Donnetta Hutching on 11/25/19.  He was seen on Jan 04, 2020 in follow-up.  His fistula was not yet of adequate diameter for access.  He was instructed to continue exercising his upper extremity.  No complaints of hand pain, numbness or tingling.  Dialyzing via [facility] without complications.   Dialysis days:  TTS, 12:30 pm seat time Dialysis center:  [facility] clinic  S/p circumflex coronary artery DES 07/29/2018 by Dr. Ellyn Hack; on Brilinta, asa, statin No Known Allergies  Current Outpatient Medications  Medication Sig Dispense Refill  . acetaminophen (TYLENOL) 325 MG tablet Take 1-2 tablets (325-650 mg total) by mouth every 6 (six) hours as needed for mild pain (pain score 1-3 or temp > 100.5).    Marland Kitchen blood glucose meter kit and supplies Dispense based on patient and insurance preference. Use up to four times daily as directed. (FOR ICD-9 250.00, 250.01). 1 each 0  . hydrALAZINE (APRESOLINE) 25 MG tablet Take 1 tablet (25 mg total) by mouth every 8 (eight) hours. 90 tablet 0  . insulin aspart protamine - aspart (NOVOLOG MIX 70/30 FLEXPEN) (70-30) 100 UNIT/ML FlexPen Inject 0.15 mLs (15 Units total) into the skin 2 (two) times daily with a meal. (Patient taking differently: Inject 20 Units into the skin 2 (two) times daily as needed (high blood sugar). ) 15 mL 11  . Insulin Pen Needle 31G X 5 MM MISC Use daily to inject insulin as instructed 100 each 2  . LANTUS SOLOSTAR 100 UNIT/ML Solostar Pen Inject 20 Units into the skin 2 (two) times daily.    . metoprolol tartrate (LOPRESSOR) 25 MG tablet Take 0.5 tablets (12.5 mg total) by mouth 2 (two) times daily. 30 tablet 0  . Multiple Vitamin (MULTIVITAMIN WITH MINERALS) TABS tablet Take 1 tablet by mouth daily. 90 tablet 3  . oxyCODONE-acetaminophen (PERCOCET) 7.5-325 MG tablet Take 1 tablet  by mouth every 4 (four) hours as needed for severe pain. (Patient not taking: Reported on 01/04/2020) 6 tablet 0  . pantoprazole (PROTONIX) 40 MG tablet Take 1 tablet (40 mg total) by mouth daily. 30 tablet 0  . ticagrelor (BRILINTA) 90 MG TABS tablet Take 90 mg by mouth 2 (two) times daily.     No current facility-administered medications for this visit.     ROS:  See HPI  Vitals:   02/18/20 0855  BP: 136/80  Pulse: 71  Resp: 20  Temp: 97.7 F (36.5 C)  SpO2: 99%    Physical Exam:  General appearance: Well-developed, well-nourished male in no apparent distress Cardiac: Heart rate and rhythm are regular Respiratory: Nonlabored Incision: Left AC incision is well-healed. Extremities: Positive thrill and bruit in fistula, however it is not easily palpable.  5 out of 5 left hand grip strength.  2+ radial pulse.  Sensation intact  Dialysis duplex on 02/18/2020 Findings:  +--------------------+----------+-----------------+--------+  AVF         PSV (cm/s)Flow Vol (mL/min)Comments  +--------------------+----------+-----------------+--------+  Native artery inflow  99      539          +--------------------+----------+-----------------+--------+  AVF Anastomosis     191                 +--------------------+----------+-----------------+--------+     +------------+----------+-------------+-----------+--------+  OUTFLOW VEINPSV (cm/s)Diameter (cm)Depth (cm) Describe  +------------+----------+-------------+-----------+--------+  Shoulder    62    0.44     0.65        +------------+----------+-------------+-----------+--------+  Prox UA     71    0.46     0.48        +------------+----------+-------------+-----------+--------+  Mid UA     87    0.41     0.25        +------------+----------+-------------+-----------+--------+  Dist UA    83 / 138  0.45  / 0.77 0.34 / 0.35      +------------+----------+-------------+-----------+--------+  AC Fossa    177    0.48     0.41        +------------+----------+-------------+-----------+--------+    Summary:  Patent left Brachial-cephalic fistula with no visualized stenosis.  Decreased velocity in the outflow vein on proximally  Assessment/Plan: -The patient is now 3 months postop left brachiocephalic AV fistula.  Diameter and depth of the fistula not amenable to cannulation.  Will arrange fistulogram.  Explained to the patient and he is in agreement. -pt does not have evidence of steal syndrome -HD via [place], PA-C 02/18/2020 8:41 AM [facility] [phone]  Clinic MD:  Donzetta Matters

## 2020-02-18 NOTE — H&P (View-Only) (Signed)
POST OPERATIVE DIALYSIS ACCESS OFFICE NOTE    CC:  F/u for dialysis access surgery  HPI:  This is a 49 y.o. male who is s/p left brachiocephalic AV fistula and right IJ TDC by Dr. Donnetta Hutching on 11/25/19.  He was seen on Jan 04, 2020 in follow-up.  His fistula was not yet of adequate diameter for access.  He was instructed to continue exercising his upper extremity.  No complaints of hand pain, numbness or tingling.  Dialyzing via [facility] without complications.   Dialysis days:  TTS, 12:30 pm seat time Dialysis center:  [facility] clinic  S/p circumflex coronary artery DES 07/29/2018 by Dr. Ellyn Hack; on Brilinta, asa, statin No Known Allergies  Current Outpatient Medications  Medication Sig Dispense Refill  . acetaminophen (TYLENOL) 325 MG tablet Take 1-2 tablets (325-650 mg total) by mouth every 6 (six) hours as needed for mild pain (pain score 1-3 or temp > 100.5).    Marland Kitchen blood glucose meter kit and supplies Dispense based on patient and insurance preference. Use up to four times daily as directed. (FOR ICD-9 250.00, 250.01). 1 each 0  . hydrALAZINE (APRESOLINE) 25 MG tablet Take 1 tablet (25 mg total) by mouth every 8 (eight) hours. 90 tablet 0  . insulin aspart protamine - aspart (NOVOLOG MIX 70/30 FLEXPEN) (70-30) 100 UNIT/ML FlexPen Inject 0.15 mLs (15 Units total) into the skin 2 (two) times daily with a meal. (Patient taking differently: Inject 20 Units into the skin 2 (two) times daily as needed (high blood sugar). ) 15 mL 11  . Insulin Pen Needle 31G X 5 MM MISC Use daily to inject insulin as instructed 100 each 2  . LANTUS SOLOSTAR 100 UNIT/ML Solostar Pen Inject 20 Units into the skin 2 (two) times daily.    . metoprolol tartrate (LOPRESSOR) 25 MG tablet Take 0.5 tablets (12.5 mg total) by mouth 2 (two) times daily. 30 tablet 0  . Multiple Vitamin (MULTIVITAMIN WITH MINERALS) TABS tablet Take 1 tablet by mouth daily. 90 tablet 3  . oxyCODONE-acetaminophen (PERCOCET) 7.5-325 MG tablet Take 1 tablet  by mouth every 4 (four) hours as needed for severe pain. (Patient not taking: Reported on 01/04/2020) 6 tablet 0  . pantoprazole (PROTONIX) 40 MG tablet Take 1 tablet (40 mg total) by mouth daily. 30 tablet 0  . ticagrelor (BRILINTA) 90 MG TABS tablet Take 90 mg by mouth 2 (two) times daily.     No current facility-administered medications for this visit.     ROS:  See HPI  Vitals:   02/18/20 0855  BP: 136/80  Pulse: 71  Resp: 20  Temp: 97.7 F (36.5 C)  SpO2: 99%    Physical Exam:  General appearance: Well-developed, well-nourished male in no apparent distress Cardiac: Heart rate and rhythm are regular Respiratory: Nonlabored Incision: Left AC incision is well-healed. Extremities: Positive thrill and bruit in fistula, however it is not easily palpable.  5 out of 5 left hand grip strength.  2+ radial pulse.  Sensation intact  Dialysis duplex on 02/18/2020 Findings:  +--------------------+----------+-----------------+--------+  AVF         PSV (cm/s)Flow Vol (mL/min)Comments  +--------------------+----------+-----------------+--------+  Native artery inflow  99      539          +--------------------+----------+-----------------+--------+  AVF Anastomosis     191                 +--------------------+----------+-----------------+--------+     +------------+----------+-------------+-----------+--------+  OUTFLOW VEINPSV (cm/s)Diameter (cm)Depth (cm) Describe  +------------+----------+-------------+-----------+--------+  Shoulder    62    0.44     0.65        +------------+----------+-------------+-----------+--------+  Prox UA     71    0.46     0.48        +------------+----------+-------------+-----------+--------+  Mid UA     87    0.41     0.25        +------------+----------+-------------+-----------+--------+  Dist UA    83 / 138  0.45  / 0.77 0.34 / 0.35      +------------+----------+-------------+-----------+--------+  AC Fossa    177    0.48     0.41        +------------+----------+-------------+-----------+--------+    Summary:  Patent left Brachial-cephalic fistula with no visualized stenosis.  Decreased velocity in the outflow vein on proximally  Assessment/Plan: -The patient is now 3 months postop left brachiocephalic AV fistula.  Diameter and depth of the fistula not amenable to cannulation.  Will arrange fistulogram.  Explained to the patient and he is in agreement. -pt does not have evidence of steal syndrome -HD via [place], PA-C 02/18/2020 8:41 AM [facility] [phone]  Clinic MD:  Donzetta Matters

## 2020-03-06 ENCOUNTER — Ambulatory Visit (HOSPITAL_COMMUNITY)
Admission: RE | Admit: 2020-03-06 | Discharge: 2020-03-06 | Disposition: A | Discharge status: Home / Self Care | Payer: Medicare Other | Attending: Vascular Surgery | Admitting: Vascular Surgery

## 2020-03-06 ENCOUNTER — Encounter (HOSPITAL_COMMUNITY)
Admission: RE | Disposition: A | Discharge status: Home / Self Care | Payer: Self-pay | Source: Home / Self Care | Attending: Vascular Surgery

## 2020-03-06 ENCOUNTER — Encounter (HOSPITAL_COMMUNITY): Payer: Self-pay | Admitting: Vascular Surgery

## 2020-03-06 DIAGNOSIS — N186 End stage renal disease: Secondary | ICD-10-CM

## 2020-03-06 DIAGNOSIS — Z794 Long term (current) use of insulin: Secondary | ICD-10-CM | POA: Insufficient documentation

## 2020-03-06 DIAGNOSIS — Y841 Kidney dialysis as the cause of abnormal reaction of the patient, or of later complication, without mention of misadventure at the time of the procedure: Secondary | ICD-10-CM | POA: Diagnosis not present

## 2020-03-06 DIAGNOSIS — Z992 Dependence on renal dialysis: Secondary | ICD-10-CM | POA: Insufficient documentation

## 2020-03-06 DIAGNOSIS — Z79899 Other long term (current) drug therapy: Secondary | ICD-10-CM | POA: Diagnosis not present

## 2020-03-06 DIAGNOSIS — T82858A Stenosis of vascular prosthetic devices, implants and grafts, initial encounter: Secondary | ICD-10-CM | POA: Insufficient documentation

## 2020-03-06 DIAGNOSIS — T82898A Other specified complication of vascular prosthetic devices, implants and grafts, initial encounter: Secondary | ICD-10-CM

## 2020-03-06 HISTORY — PX: A/V FISTULAGRAM: CATH118298

## 2020-03-06 LAB — POCT I-STAT, CHEM 8
BUN: 68 mg/dL — ABNORMAL HIGH (ref 6–20)
Calcium, Ion: 1.01 mmol/L — ABNORMAL LOW (ref 1.15–1.40)
Chloride: 97 mmol/L — ABNORMAL LOW (ref 98–111)
Creatinine, Ser: 5.5 mg/dL — ABNORMAL HIGH (ref 0.61–1.24)
Glucose, Bld: 432 mg/dL — ABNORMAL HIGH (ref 70–99)
HCT: 34 % — ABNORMAL LOW (ref 39.0–52.0)
Hemoglobin: 11.6 g/dL — ABNORMAL LOW (ref 13.0–17.0)
Potassium: 5.3 mmol/L — ABNORMAL HIGH (ref 3.5–5.1)
Sodium: 135 mmol/L (ref 135–145)
TCO2: 26 mmol/L (ref 22–32)

## 2020-03-06 SURGERY — A/V FISTULAGRAM
Anesthesia: LOCAL | Laterality: Left

## 2020-03-06 MED ORDER — LIDOCAINE HCL (PF) 1 % IJ SOLN
INTRAMUSCULAR | Status: AC
  Filled 2020-03-06: qty 30

## 2020-03-06 MED ORDER — HEPARIN (PORCINE) IN NACL 1000-0.9 UT/500ML-% IV SOLN
INTRAVENOUS | Status: AC
  Filled 2020-03-06: qty 500

## 2020-03-06 MED ORDER — LIDOCAINE HCL (PF) 1 % IJ SOLN
INTRAMUSCULAR | Status: DC | PRN
  Administered 2020-03-06 (×2): 2 mL via INTRADERMAL

## 2020-03-06 MED ORDER — IODIXANOL 320 MG/ML IV SOLN
INTRAVENOUS | Status: DC | PRN
  Administered 2020-03-06: 35 mL

## 2020-03-06 MED ORDER — HEPARIN (PORCINE) IN NACL 1000-0.9 UT/500ML-% IV SOLN
INTRAVENOUS | Status: DC | PRN
  Administered 2020-03-06: 500 mL

## 2020-03-06 SURGICAL SUPPLY — 16 items
BAG SNAP BAND KOVER 36X36 (MISCELLANEOUS) ×2 IMPLANT
CATH ANGIO 5F BER2 65CM (CATHETERS) ×1 IMPLANT
COVER DOME SNAP 22 D (MISCELLANEOUS) ×2 IMPLANT
DEVICE TORQUE H2O (MISCELLANEOUS) ×1 IMPLANT
GUIDEWIRE ANGLED .035X150CM (WIRE) ×1 IMPLANT
KIT MICROPUNCTURE NIT STIFF (SHEATH) ×1 IMPLANT
PROTECTION STATION PRESSURIZED (MISCELLANEOUS) ×2
SHEATH PINNACLE R/O II 6F 4CM (SHEATH) ×1 IMPLANT
SHEATH PROBE COVER 6X72 (BAG) ×2 IMPLANT
STATION PROTECTION PRESSURIZED (MISCELLANEOUS) ×1 IMPLANT
STOPCOCK MORSE 400PSI 3WAY (MISCELLANEOUS) ×2 IMPLANT
TRAY PV CATH (CUSTOM PROCEDURE TRAY) ×2 IMPLANT
TUBING CIL FLEX 10 FLL-RA (TUBING) ×2 IMPLANT
WIRE BENTSON .035X145CM (WIRE) ×1 IMPLANT
WIRE G V18X300CM (WIRE) ×1 IMPLANT
WIRE TORQFLEX AUST .018X40CM (WIRE) ×3 IMPLANT

## 2020-03-06 NOTE — H&P (Signed)
   History and Physical Update  The patient was interviewed and re-examined.  The patient's previous History and Physical has been reviewed and is unchanged from recent office visit. Plan for left arm fistulogram with possible intervention.   Carloyn Lahue C. Donzetta Matters, MD [facility] [place] Office: [phone] Pager: [phone]  03/06/2020, 7:18 AM

## 2020-03-06 NOTE — Discharge Instructions (Signed)

## 2020-03-06 NOTE — Op Note (Signed)
    Patient name: Edward [name] MRN: [id number] DOB: 1970-11-05 Sex: male  03/06/2020 Pre-operative Diagnosis: End-stage renal disease, malfunction left arm AV fistula Post-operative diagnosis:  Same Surgeon:  Eda Paschal. Donzetta Matters, MD Procedure Performed: 1.  Ultrasound-guided cannulation left arm AV fistula and antegrade direction 2.  Left upper extremity fistulogram 3.  Ultrasound-guided cannulation left arm AV fistula and retrograde direction   Indications: 49 year old male with end-stage renal disease currently dialyzing via catheter.  He has a cephalic vein fistula in the left arm that is failed to mature.  He is now indicated for fistulogram with possible intervention.  Findings: Fistula itself was difficult to cannulate near the antecubitum.  After retrograde fistulogram we demonstrated that there was subtotal occlusion at least 95% stenosis.  We have attempted to cross this in a retrograde fashion but could not.  Patient will be scheduled for open revision of left arm AV fistula versus conversion to basilic vein fistula or left arm AV graft.   Procedure:  The patient was identified in the holding area and taken to room 8.  The patient was then placed supine on the table and prepped and draped in the usual sterile fashion.  A time out was called.  Ultrasound was used to evaluate the left arm AV fistula.  Is quite sclerotic we were able to anesthetize the skin and cannulated with micropuncture needle followed by wire sheath.  Did take several wires.  We performed left upper extremity fistulogram.  With the above findings we pulled the sheath and held pressure.  We then prepped the arm higher.  We used ultrasound to cannulate the fistula in a retrograde fashion.  Both images were saved to the permanent record.  We placed a Bentson wire followed 5 x 6 French sheath.  We attempted using very catheter and Glidewire and V 18 wire but could not get any purchase distally.  We elected to abort the  procedure and plan for OR revision of the fistula.  He tolerated procedure without any complication.  Contrast: 35 cc   Kayden Hutmacher C. Donzetta Matters, MD [facility] [place] Office: [phone] Pager: [phone]

## 2020-03-07 DIAGNOSIS — Z4802 Encounter for removal of sutures: Secondary | ICD-10-CM | POA: Insufficient documentation

## 2020-03-13 ENCOUNTER — Other Ambulatory Visit: Payer: Self-pay

## 2020-03-14 ENCOUNTER — Ambulatory Visit (INDEPENDENT_AMBULATORY_CARE_PROVIDER_SITE_OTHER): Payer: Medicare Other | Admitting: Cardiology

## 2020-03-14 ENCOUNTER — Other Ambulatory Visit: Payer: Self-pay

## 2020-03-14 ENCOUNTER — Encounter: Payer: Self-pay | Admitting: Cardiology

## 2020-03-14 VITALS — BP 140/78 | HR 69 | Ht 72.0 in | Wt 257.8 lb

## 2020-03-14 DIAGNOSIS — I739 Peripheral vascular disease, unspecified: Secondary | ICD-10-CM | POA: Diagnosis not present

## 2020-03-14 DIAGNOSIS — Z9861 Coronary angioplasty status: Secondary | ICD-10-CM | POA: Diagnosis not present

## 2020-03-14 DIAGNOSIS — I428 Other cardiomyopathies: Secondary | ICD-10-CM

## 2020-03-14 DIAGNOSIS — I2510 Atherosclerotic heart disease of native coronary artery without angina pectoris: Secondary | ICD-10-CM

## 2020-03-14 MED ORDER — ASPIRIN EC 81 MG PO TBEC: 81.0000 mg | DELAYED_RELEASE_TABLET | Freq: Every day | ORAL | 3 refills | Status: DC

## 2020-03-14 MED ORDER — ROSUVASTATIN CALCIUM 40 MG PO TABS: 40.0000 mg | ORAL_TABLET | Freq: Every day | ORAL | 3 refills | Status: DC

## 2020-03-14 NOTE — Patient Instructions (Signed)
Medication Instructions:   START ASPIRIN 81 MG ONCE DAILY  START ROSUVASTATIN 40 MG ONCE DAILY  *If you need a refill on your cardiac medications before your next appointment, please call your pharmacy*   Lab Work:  Your physician recommends that you return for lab work in: 12 WEEKS=FASTING  If you have labs (blood work) drawn today and your tests are completely normal, you will receive your results only by: Marland Kitchen MyChart Message (if you have MyChart) OR . A paper copy in the mail If you have any lab test that is abnormal or we need to change your treatment, we will call you to review the results.  Follow-Up: At [facility], you and your health needs are our priority.  As part of our continuing mission to provide you with exceptional heart care, we have created designated Provider Care Teams.  These Care Teams include your primary Cardiologist (physician) and Advanced Practice Providers (APPs -  Physician Assistants and Nurse Practitioners) who all work together to provide you with the care you need, when you need it.  We recommend signing up for the patient portal called "MyChart".  Sign up information is provided on this After Visit Summary.  MyChart is used to connect with patients for Virtual Visits (Telemedicine).  Patients are able to view lab/test results, encounter notes, upcoming appointments, etc.  Non-urgent messages can be sent to your provider as well.   To learn more about what you can do with MyChart, go to [email or web address].    Your next appointment:   6 month(s)  The format for your next appointment:   In Person  Provider:   You may see Kirk Ruths, MD or one of the following Advanced Practice Providers on your designated Care Team:    Kerin Ransom, PA-C  [place], [place]  Coletta Memos, [place]

## 2020-03-14 NOTE — Progress Notes (Signed)
HPI: Follow-up coronary artery disease. Patient admitted December 2019 with CHF and non-ST elevation myocardial infarction. Cardiac catheterization revealed an 85% circumflex, 90% mid to distal LAD (distal LAD small vessel and not amenable to PCI) and 75% first marginal. Patient had PCI of his left circumflex with drug-eluting stent.    Patient admitted April 2021 with syncope in the setting of nausea and acute renal failure.  Potassium was 6.9.  There was a question of atrial fibrillation on his electrocardiogram but more likely junctional rhythm.  Echocardiogram April 2021 showed ejection fraction 40 to 45%, mild left ventricular hypertrophy, apical hypokinesis, moderate left atrial enlargement, mild mitral regurgitation, mild aortic insufficiency. Since last seen,  patient denies dyspnea, chest pain, palpitations or syncope.  He states his blood pressure is low on dialysis days.  Current Outpatient Medications  Medication Sig Dispense Refill  . blood glucose meter kit and supplies Dispense based on patient and insurance preference. Use up to four times daily as directed. (FOR ICD-9 250.00, 250.01). 1 each 0  . Insulin Pen Needle 31G X 5 MM MISC Use daily to inject insulin as instructed 100 each 2  . LANTUS SOLOSTAR 100 UNIT/ML Solostar Pen Inject 20 Units into the skin 2 (two) times daily.    . Multiple Vitamin (MULTIVITAMIN WITH MINERALS) TABS tablet Take 1 tablet by mouth daily. 90 tablet 3   Current Facility-Administered Medications  Medication Dose Route Frequency Provider Last Rate Last Admin  . 0.9 %  sodium chloride infusion  250 mL Intravenous PRN Waynetta Sandy, MD      . sodium chloride flush (NS) 0.9 % injection 3 mL  3 mL Intravenous Q12H Waynetta Sandy, MD      . sodium chloride flush (NS) 0.9 % injection 3 mL  3 mL Intravenous PRN Waynetta Sandy, MD         Past Medical History:  Diagnosis Date  . Anxiety    self reported  .  Arthritis   . CHF (congestive heart failure) ([place])   . Chronic kidney disease   . Coronary artery disease   . Depression    self reported  . Diabetes mellitus   . Hypertension     Past Surgical History:  Procedure Laterality Date  . A/V FISTULAGRAM Left 03/06/2020   Procedure: A/V FISTULAGRAM;  Surgeon: Waynetta Sandy, MD;  Location: [facility];  Service: Cardiovascular;  Laterality: Left;  . AMPUTATION Left 05/10/2016   Procedure: Left Transmetatarsal Amputation;  Surgeon: [name], MD;  Location: [place];  Service: Orthopedics;  Laterality: Left;  . AMPUTATION Right 05/16/2018   Procedure: PARTIAL RAY AMPUTATION FIFTH TOE RIGHT FOOT;  Surgeon: [name], DPM;  Location: [facility];  Service: Podiatry;  Laterality: Right;  . AMPUTATION Right 06/17/2018   Procedure: AMPUTATION 4th RAY Right Foot;  Surgeon: Trula Slade, DPM;  Location: [place];  Service: Podiatry;  Laterality: Right;  . AMPUTATION Right 06/19/2018   Procedure: RIGHT BELOW KNEE AMPUTATION;  Surgeon: [name], MD;  Location: [facility];  Service: Orthopedics;  Laterality: Right;  . APPLICATION OF WOUND VAC Right 06/19/2018   Procedure: APPLICATION OF WOUND VAC;  Surgeon: [name], MD;  Location: [place];  Service: Orthopedics;  Laterality: Right;  . AV FISTULA PLACEMENT Left 11/25/2019   Procedure: LEFT ARM Brachiocephalic  ARTERIOVENOUS (AV) FISTULA CREATION;  Surgeon: Rosetta Posner, MD;  Location: [facility];  Service: Vascular;  Laterality:  Left;  . CARDIAC CATHETERIZATION  07/29/2018  . CIRCUMCISION  09/02/2011   Procedure: CIRCUMCISION ADULT;  Surgeon: Molli Hazard, MD;  Location: [facility];  Service: Urology;  Laterality: N/A;  . CORONARY STENT INTERVENTION N/A 07/29/2018   Procedure: CORONARY STENT INTERVENTION;  Surgeon: Leonie Man, MD;  Location: [facility];  Service: Cardiovascular;  Laterality: N/A;  . I & D EXTREMITY  09/02/2011   Procedure: IRRIGATION  AND DEBRIDEMENT EXTREMITY;  Surgeon: Theodoro Kos, DO;  Location: [facility];  Service: Plastics;  Laterality: N/A;  . INCISION AND DRAINAGE OF WOUND  08/12/2011   Procedure: IRRIGATION AND DEBRIDEMENT WOUND;  Surgeon: Zenovia Jarred, MD;  Location: [place];  Service: General;;  perineum  . INSERTION OF DIALYSIS CATHETER N/A 11/25/2019   Procedure: INSERTION OF Righ Internal Jugular DIALYSIS CATHETER;  Surgeon: Rosetta Posner, MD;  Location: [place];  Service: Vascular;  Laterality: N/A;  . IRRIGATION AND DEBRIDEMENT ABSCESS  08/12/2011   Procedure: IRRIGATION AND DEBRIDEMENT ABSCESS;  Surgeon: Molli Hazard, MD;  Location: [place];  Service: Urology;  Laterality: N/A;  irrigation and debridment perineum    . IRRIGATION AND DEBRIDEMENT ABSCESS  08/14/2011   Procedure: MINOR INCISION AND DRAINAGE OF ABSCESS;  Surgeon: Molli Hazard, MD;  Location: [place];  Service: Urology;  Laterality: N/A;  Perineal Wound Debridement;Placement Bilateral Testicular Thigh Pouches  . LEFT HEART CATH AND CORONARY ANGIOGRAPHY N/A 07/29/2018   Procedure: LEFT HEART CATH AND CORONARY ANGIOGRAPHY;  Surgeon: Leonie Man, MD;  Location: [facility];  Service: Cardiovascular;  Laterality: N/A;  . TOTAL HIP ARTHROPLASTY Left 10/11/2016   Procedure: LEFT TOTAL HIP ARTHROPLASTY ANTERIOR APPROACH;  Surgeon: Mcarthur Rossetti, MD;  Location: [facility];  Service: Orthopedics;  Laterality: Left;    Social History   Socioeconomic History  . Marital status: Widowed    Spouse name: Not on file  . Number of children: Not on file  . Years of education: Not on file  . Highest education level: Not on file  Occupational History  . Occupation: Disabled  Tobacco Use  . Smoking status: Former Smoker    Types: Cigarettes    Quit date: 11/04/2019    Years since quitting: 0.3  . Smokeless tobacco: Never Used  Vaping Use  . Vaping Use: Never used  Substance and Sexual Activity  . Alcohol use:  Yes    Alcohol/week: 0.0 standard drinks    Comment: has not had alcohol in a month  . Drug use: No  . Sexual activity: Not on file  Other Topics Concern  . Not on file  Social History Narrative   Lives w/ his 2 sons.   Social Determinants of Health   Financial Resource Strain:   . Difficulty of Paying Living Expenses:   Food Insecurity:   . Worried About Charity fundraiser in the Last Year:   . Arboriculturist in the Last Year:   Transportation Needs:   . Film/video editor (Medical):   Marland Kitchen Lack of Transportation (Non-Medical):   Physical Activity:   . Days of Exercise per Week:   . Minutes of Exercise per Session:   Stress:   . Feeling of Stress :   Social Connections:   . Frequency of Communication with Friends and Family:   . Frequency of Social Gatherings with Friends and Family:   . Attends Religious Services:   . Active Member of  Clubs or Organizations:   . Attends Archivist Meetings:   Marland Kitchen Marital Status:   Intimate Partner Violence:   . Fear of Current or Ex-Partner:   . Emotionally Abused:   Marland Kitchen Physically Abused:   . Sexually Abused:     Family History  Problem Relation Age of Onset  . Diabetes Other     ROS: no fevers or chills, productive cough, hemoptysis, dysphasia, odynophagia, melena, hematochezia, dysuria, hematuria, rash, seizure activity, orthopnea, PND, pedal edema, claudication. Remaining systems are negative.  Physical Exam: Well-developed well-nourished in no acute distress.  Skin is warm and dry.  HEENT is normal.  Neck is supple.  Chest is clear to auscultation with normal expansion.  Cardiovascular exam is regular rate and rhythm.  Abdominal exam nontender or distended. No masses palpated. Extremities status post right AKA. neuro grossly intact  ECG-sinus rhythm with first-degree AV block, left axis deviation, no significant ST changes.  Personally reviewed  A/P  1 coronary artery disease-patient denies chest pain.  His  medications were discontinued during recent admission.  Resume aspirin 81 mg daily and statin.  2 chronic combined systolic/diastolic congestive heart failure-patient is now on dialysis and volume managed accordingly.  3 hyperlipidemia-resume Crestor 40 mg daily.  Check lipids and liver in 12 weeks.  4 cardiomyopathy-felt secondary to combination of coronary disease and hypertension.  However he is having problems with hypotension on dialysis days and also nondialysis days.  I have asked him to track his blood pressure.  We will add medications as needed.  5 hypertension-follow blood pressure as outlined above.  6 tobacco abuse-patient has discontinued.  7 end-stage renal disease-Per nephrology.  8 history of peripheral vascular disease-resume aspirin and statin.  9 history of moderate mitral regurgitation-appears mild on most recent study.  10 question history of atrial fibrillation-I reviewed electrocardiogram and this is likely junctional rhythm in the setting of hyperkalemia.  No documented atrial fibrillation.  Kirk Ruths, MD

## 2020-03-16 NOTE — Interval H&P Note (Signed)
History and Physical Interval Note:  03/16/2020 9:48 AM  Edward [name]  has presented today for surgery, with the diagnosis of end stage renal.  The various methods of treatment have been discussed with the patient and family. After consideration of risks, benefits and other options for treatment, the patient has consented to  Procedure(s): A/V FISTULAGRAM (Left) as a surgical intervention.  The patient's history has been reviewed, patient examined, no change in status, stable for surgery.  I have reviewed the patient's chart and labs.  Questions were answered to the patient's satisfaction.     Servando Snare

## 2020-04-07 ENCOUNTER — Telehealth: Payer: Self-pay | Admitting: Cardiology

## 2020-04-07 NOTE — Telephone Encounter (Signed)
Unable to reach Dr Dianah Field or leave a message, last office note faxed to the number provided.

## 2020-04-07 NOTE — Telephone Encounter (Signed)
    Dr. Dianah Field called, wanted to get new report for the patient. She said if she can get a call and fax information to fax# [phone]

## 2020-04-17 ENCOUNTER — Encounter (HOSPITAL_COMMUNITY): Payer: Self-pay | Admitting: Vascular Surgery

## 2020-04-17 ENCOUNTER — Telehealth: Payer: Self-pay | Admitting: Cardiology

## 2020-04-17 ENCOUNTER — Other Ambulatory Visit (HOSPITAL_COMMUNITY): Payer: Medicare Other

## 2020-04-17 ENCOUNTER — Other Ambulatory Visit: Payer: Self-pay

## 2020-04-17 NOTE — Telephone Encounter (Signed)
Pt c/o medication issue:  1. Name of Medication: rosuvastatin (CRESTOR) 40 MG tablet  2. How are you currently taking this medication (dosage and times per day)? 1 tablet a day  3. Are you having a reaction (difficulty breathing--STAT)? no  4. What is your medication issue? Patient states he has been having a rash and flaky skin since starting the medication. He states he also has a red eye and a headache.

## 2020-04-17 NOTE — Progress Notes (Addendum)
Edward [name] denies chest pain or shortness of breath. Edward [name] said that he forgot to get COvid test today, he will get it after dialysis; he gets off dialysis around 1130.  Edward [name] has type II diabetes.  Patient reported that last A1C done at Hemodialysis was " 9. Something."  Edward [name] is out of CBG strips and unable to check CBG.  I instructed patient to take 10 units of Lantus tonight.  Edward [name] reported that if CBG is less than 70 he would pass out. I instructed patient to take 10 units in am.

## 2020-04-17 NOTE — Telephone Encounter (Signed)
Please stop taking crestor. Schedule appointment with lipid clinic (pharmacist) to discuss cholesterol management options.

## 2020-04-17 NOTE — Telephone Encounter (Signed)
Spoke with pt, aware to stop crestor. Follow up scheduled

## 2020-04-17 NOTE — Telephone Encounter (Signed)
Patient states he started taking Crestor a few weeks ago.   His hands are peeling and have a rash, he has a bad headache and red eye. These symptoms started last week.   He is sure that these symptoms are coming from the Crestor because he has not started anything other than that recently. He is wondering if the dose can be lowered or he can stop it all together. Will route to MD and Pharmd for advisement.

## 2020-04-18 ENCOUNTER — Other Ambulatory Visit (HOSPITAL_COMMUNITY)
Admission: RE | Admit: 2020-04-18 | Discharge: 2020-04-18 | Disposition: A | Discharge status: Home / Self Care | Payer: Medicare Other | Source: Ambulatory Visit | Attending: Vascular Surgery | Admitting: Vascular Surgery

## 2020-04-18 DIAGNOSIS — Z20822 Contact with and (suspected) exposure to covid-19: Secondary | ICD-10-CM | POA: Insufficient documentation

## 2020-04-18 DIAGNOSIS — Z01812 Encounter for preprocedural laboratory examination: Secondary | ICD-10-CM | POA: Insufficient documentation

## 2020-04-18 LAB — SARS CORONAVIRUS 2 (TAT 6-24 HRS): SARS Coronavirus 2: NEGATIVE

## 2020-04-19 ENCOUNTER — Encounter (HOSPITAL_COMMUNITY)
Admission: RE | Disposition: A | Discharge status: Home / Self Care | Payer: Self-pay | Source: Home / Self Care | Attending: Vascular Surgery

## 2020-04-19 ENCOUNTER — Encounter (HOSPITAL_COMMUNITY): Payer: Self-pay | Admitting: Vascular Surgery

## 2020-04-19 ENCOUNTER — Encounter (HOSPITAL_COMMUNITY): Payer: Self-pay | Admitting: Anesthesiology

## 2020-04-19 ENCOUNTER — Ambulatory Visit (HOSPITAL_COMMUNITY)
Admission: RE | Admit: 2020-04-19 | Discharge: 2020-04-19 | Disposition: A | Discharge status: Home / Self Care | Payer: Medicare Other | Attending: Vascular Surgery | Admitting: Vascular Surgery

## 2020-04-19 ENCOUNTER — Other Ambulatory Visit: Payer: Self-pay

## 2020-04-19 DIAGNOSIS — E119 Type 2 diabetes mellitus without complications: Secondary | ICD-10-CM | POA: Insufficient documentation

## 2020-04-19 DIAGNOSIS — Z01818 Encounter for other preprocedural examination: Secondary | ICD-10-CM | POA: Insufficient documentation

## 2020-04-19 DIAGNOSIS — Z538 Procedure and treatment not carried out for other reasons: Secondary | ICD-10-CM | POA: Insufficient documentation

## 2020-04-19 HISTORY — DX: Other complications of anesthesia, initial encounter: T88.59XA

## 2020-04-19 LAB — POCT I-STAT, CHEM 8
BUN: 53 mg/dL — ABNORMAL HIGH (ref 6–20)
Calcium, Ion: 0.96 mmol/L — ABNORMAL LOW (ref 1.15–1.40)
Chloride: 99 mmol/L (ref 98–111)
Creatinine, Ser: 5.1 mg/dL — ABNORMAL HIGH (ref 0.61–1.24)
Glucose, Bld: 433 mg/dL — ABNORMAL HIGH (ref 70–99)
HCT: 33 % — ABNORMAL LOW (ref 39.0–52.0)
Hemoglobin: 11.2 g/dL — ABNORMAL LOW (ref 13.0–17.0)
Potassium: 4.8 mmol/L (ref 3.5–5.1)
Sodium: 135 mmol/L (ref 135–145)
TCO2: 24 mmol/L (ref 22–32)

## 2020-04-19 LAB — GLUCOSE, CAPILLARY
Glucose-Capillary: 366 mg/dL — ABNORMAL HIGH (ref 70–99)
Glucose-Capillary: 410 mg/dL — ABNORMAL HIGH (ref 70–99)

## 2020-04-19 SURGERY — ARTERIOVENOUS (AV) FISTULA CREATION
Anesthesia: Monitor Anesthesia Care | Laterality: Left

## 2020-04-19 MED ORDER — SODIUM CHLORIDE 0.9 % IV SOLN: INTRAVENOUS | Status: DC

## 2020-04-19 MED ORDER — CHLORHEXIDINE GLUCONATE 0.12 % MT SOLN
OROMUCOSAL | Status: AC
  Administered 2020-04-19: 15 mL via OROMUCOSAL
  Filled 2020-04-19: qty 15

## 2020-04-19 MED ORDER — CHLORHEXIDINE GLUCONATE 4 % EX LIQD: 60.0000 mL | Freq: Once | CUTANEOUS | Status: DC

## 2020-04-19 MED ORDER — CEFAZOLIN SODIUM-DEXTROSE 2-4 GM/100ML-% IV SOLN: 2.0000 g | INTRAVENOUS | Status: DC

## 2020-04-19 MED ORDER — CHLORHEXIDINE GLUCONATE 0.12 % MT SOLN: 15.0000 mL | Freq: Once | OROMUCOSAL | Status: AC

## 2020-04-19 MED ORDER — INSULIN ASPART 100 UNIT/ML ~~LOC~~ SOLN
10.0000 [IU] | Freq: Once | SUBCUTANEOUS | Status: AC
  Filled 2020-04-19: qty 0.1

## 2020-04-19 MED ORDER — INSULIN ASPART 100 UNIT/ML ~~LOC~~ SOLN
SUBCUTANEOUS | Status: AC
  Administered 2020-04-19: 10 [IU] via SUBCUTANEOUS
  Filled 2020-04-19: qty 1

## 2020-04-19 MED ORDER — ORAL CARE MOUTH RINSE: 15.0000 mL | Freq: Once | OROMUCOSAL | Status: AC

## 2020-04-19 MED ORDER — CEFAZOLIN SODIUM-DEXTROSE 2-4 GM/100ML-% IV SOLN
INTRAVENOUS | Status: AC
  Filled 2020-04-19: qty 100

## 2020-04-19 NOTE — H&P (Signed)
   Patient presented for revision of left arm fistula with possible graft placement.  Blood glucose on initial check was 366.  Dr. Lissa Hoard not willing to proceed citing practice parameters of glucose level to be less than 300.  We will reschedule him on a nondialysis day in the near future.   Loretta Doutt C. Donzetta Matters, MD [facility] Office: [phone] Pager: [phone]

## 2020-04-19 NOTE — Progress Notes (Signed)
Dr. Lissa Hoard cancelled case due to BG 366.  Called Dr. Donzetta Matters.  Dr. Donzetta Matters came to bedside and notified pt.  BG rechecked and was 410.  Dr. Donzetta Matters ordered 10 units of Novolog.  Administered as ordered and discharged pt.

## 2020-04-19 NOTE — Progress Notes (Signed)
Patient's CBG 410, anesthesia notified and deferred to surgeon for treatment. Surgeon paged and stated to give Novolog 10 units SQ now and have patient follow up with PCP. Patient made aware and verbalized understanding.

## 2020-05-10 ENCOUNTER — Other Ambulatory Visit: Payer: Self-pay

## 2020-05-11 ENCOUNTER — Ambulatory Visit: Payer: Medicare Other

## 2020-05-11 NOTE — Progress Notes (Deleted)
Patient ID: Edward [name]                 DOB: 1970/09/22                    MRN: [id number]     HPI: Edward [name] is a 49 y.o. male patient referred to lipid clinic by Dr Stanford Breed. PMH is significant for diabetes,hypertensojn,  hyperlipidemia, Cad s/p stent placement, CKD  Current Medications:  Intolerances:  Risk Factors:  LDL goal:   Diet:   Exercise:   Family History:   Social History:   Labs:  Past Medical History:  Diagnosis Date  . Anxiety    self reported  . Arthritis   . CHF (congestive heart failure) ([place])   . Chronic kidney disease   . Complication of anesthesia    slow to go to sleep  . Coronary artery disease   . Depression    self reported  . Diabetes mellitus    type II  . Hypertension   . Myocardial infarction [facility]) 2019    Current Outpatient Medications on File Prior to Visit  Medication Sig Dispense Refill  . aspirin EC 81 MG tablet Take 1 tablet (81 mg total) by mouth daily. Swallow whole. (Patient not taking: Reported on 04/13/2020) 90 tablet 3  . bismuth subsalicylate (PEPTO BISMOL) 262 MG/15ML suspension Take 30 mLs by mouth every 6 (six) hours as needed for indigestion.    . blood glucose meter kit and supplies Dispense based on patient and insurance preference. Use up to four times daily as directed. (FOR ICD-9 250.00, 250.01). 1 each 0  . cholecalciferol (VITAMIN D3) 25 MCG (1000 UNIT) tablet Take 1,000 Units by mouth daily.    . Insulin Pen Needle 31G X 5 MM MISC Use daily to inject insulin as instructed 100 each 2  . LANTUS SOLOSTAR 100 UNIT/ML Solostar Pen Inject 20 Units into the skin 2 (two) times daily.    . Multiple Vitamin (MULTIVITAMIN WITH MINERALS) TABS tablet Take 1 tablet by mouth daily. (Patient not taking: Reported on 04/13/2020) 90 tablet 3  . MULTIPLE VITAMIN PO Take 1 tablet by mouth 1 day or 1 dose.     Current Facility-Administered Medications on File Prior to Visit  Medication Dose Route Frequency Provider Last Rate Last  Admin  . 0.9 %  sodium chloride infusion  250 mL Intravenous PRN Waynetta Sandy, MD      . sodium chloride flush (NS) 0.9 % injection 3 mL  3 mL Intravenous Q12H Waynetta Sandy, MD      . sodium chloride flush (NS) 0.9 % injection 3 mL  3 mL Intravenous PRN Waynetta Sandy, MD        Allergies  Allergen Reactions  . Crestor [Rosuvastatin] Rash    Also had a red eye    No problem-specific Assessment & Plan notes found for this encounter.   Tiffiny Worthy Rodriguez-Guzman PharmD, BCPS, CPP [place] [address] [place] [address] 05/11/2020 1:19 PM

## 2020-05-22 ENCOUNTER — Other Ambulatory Visit (HOSPITAL_COMMUNITY)
Admission: RE | Admit: 2020-05-22 | Discharge: 2020-05-22 | Disposition: A | Discharge status: Home / Self Care | Payer: Medicare Other | Source: Ambulatory Visit | Attending: Vascular Surgery | Admitting: Vascular Surgery

## 2020-05-22 ENCOUNTER — Encounter (HOSPITAL_COMMUNITY): Payer: Self-pay | Admitting: Vascular Surgery

## 2020-05-22 DIAGNOSIS — Z01812 Encounter for preprocedural laboratory examination: Secondary | ICD-10-CM | POA: Diagnosis present

## 2020-05-22 DIAGNOSIS — Z20822 Contact with and (suspected) exposure to covid-19: Secondary | ICD-10-CM | POA: Insufficient documentation

## 2020-05-22 LAB — SARS CORONAVIRUS 2 (TAT 6-24 HRS): SARS Coronavirus 2: NEGATIVE

## 2020-05-22 NOTE — Progress Notes (Signed)
PCP - Dr Berdine Addison Cardiologist - Dr Stanford Breed  Chest x-ray - 11/25/19 (1V) EKG - 03/15/20 Stress Test - n/a ECHO - 11/23/19 Cardiac Cath - 07/29/18  Fasting Blood Sugar - unknown, checks cbg after breakfast Checks Blood Sugar 2  times a day  . THE NIGHT BEFORE SURGERY, take 10 units of Lantus Insulin.     . THE MORNING OF SURGERY, take 10 units of Lantus Insulin.  . If your blood sugar is less than 70 mg/dL, you will need to treat for low blood sugar: o Treat a low blood sugar (less than 70 mg/dL) with  cup of clear juice (cranberry or apple), 4 glucose tablets, OR glucose gel. o Recheck blood sugar in 15 minutes after treatment (to make sure it is greater than 70 mg/dL). If your blood sugar is not greater than 70 mg/dL on recheck, call [phone] for further instructions.  Anesthesia review: Yes  STOP now taking any Aspirin (unless otherwise instructed by your surgeon), Aleve, Naproxen, Ibuprofen, Motrin, Advil, Goody's, BC's, all herbal medications, fish oil, and all vitamins.   Coronavirus Screening Covid test scheduled on 05/22/20 Do you have any of the following symptoms:  Cough yes/no: No Fever (>100.38F)  yes/no: No Runny nose yes/no: No Sore throat yes/no: No Difficulty breathing/shortness of breath  yes/no: No  Have you traveled in the last 14 days and where? yes/no: No  Patient verbalized understanding of instructions that were given via phone.

## 2020-05-23 NOTE — Anesthesia Preprocedure Evaluation (Addendum)
Anesthesia Evaluation  Patient identified by MRN, date of birth, ID band Patient awake    Reviewed: Allergy & Precautions, NPO status , Patient's Chart, lab work & pertinent test results  History of Anesthesia Complications Negative for: history of anesthetic complications  Airway Mallampati: I  TM Distance: >3 FB Neck ROM: Full    Dental  (+) Teeth Intact, Dental Advisory Given   Pulmonary former smoker,  05/22/2020 SARS coronavirus NEG   breath sounds clear to auscultation       Cardiovascular hypertension, (-) angina+ CAD, + Past MI, + Cardiac Stents and + Peripheral Vascular Disease   Rhythm:Regular Rate:Normal  11/2019 ECHO: EF 40-45%, severe apical LV hypokinesis, mild MR   Neuro/Psych Anxiety Depression    GI/Hepatic negative GI ROS, Neg liver ROS,   Endo/Other  diabetes (glu 160), Insulin Dependentobese  Renal/GU Dialysis and ESRFRenal disease (K+ 4.0)     Musculoskeletal   Abdominal (+) + obese,   Peds  Hematology negative hematology ROS (+)   Anesthesia Other Findings   Reproductive/Obstetrics                           Anesthesia Physical Anesthesia Plan  ASA: III  Anesthesia Plan: MAC   Post-op Pain Management:    Induction:   PONV Risk Score and Plan: 1 and Ondansetron and Treatment may vary due to age or medical condition  Airway Management Planned: Natural Airway and Simple Face Mask  Additional Equipment: None  Intra-op Plan:   Post-operative Plan:   Informed Consent: I have reviewed the patients History and Physical, chart, labs and discussed the procedure including the risks, benefits and alternatives for the proposed anesthesia with the patient or authorized representative who has indicated his/her understanding and acceptance.     Dental advisory given  Plan Discussed with: CRNA and Surgeon  Anesthesia Plan Comments: (PAT note by Karoline Caldwell,  PA-C: Patient admitted December 2019 with CHF and non-ST elevation myocardial infarction. Cardiac catheterization revealed an 85% circumflex, 90% mid to distal LAD (distal LAD small vessel and not amenable to PCI) and 75% first marginal. Patient had PCI of his left circumflex with drug-eluting stent.  Patient admitted April 2021 with syncope in the setting of nausea and acute renal failure.  Potassium was 6.9.  There was a question of atrial fibrillation on his electrocardiogram but more likely junctional rhythm.  Echocardiogram April 2021 showed ejection fraction 40 to 45%, mild left ventricular hypertrophy, apical hypokinesis, moderate left atrial enlargement, mild mitral regurgitation, mild aortic insufficiency.  Last seen by Dr. Stanford Breed 03/14/2020.  Per note, patient denied dyspnea, chest pain, palpitations or syncope.  ASA 81 and statin were restarted after the have been discontinued during recent admission.  Otherwise no changes to management were made.  Uncontrolled IDDM 2.  Last A1c 14.7 on 11/22/2019.  Surgery previously canceled for CBG >400 on DOS.  ESRD on HD via right IJ TDC.  We will need day of surgery labs evaluation.  EKG 03/14/2020: Sinus rhythm with sinus arrhythmia with first-degree AV block.  TTE 11/23/19: 1. Left ventricular ejection fraction, by estimation, is 40 to 45%. The  left ventricle has mildly decreased function. The left ventricle  demonstrates regional wall motion abnormalities (see scoring  diagram/findings for description). There is mild  concentric left ventricular hypertrophy. Indeterminate diastolic filling  due to E-A fusion. There is severe hypokinesis of the left ventricular,  entire apical segment.  2. Right ventricular systolic function  is normal. The right ventricular  size is normal. Tricuspid regurgitation signal is inadequate for assessing  PA pressure.  3. Left atrial size was moderately dilated.  4. The mitral valve is normal in structure.  Mild mitral valve  regurgitation.  5. The aortic valve is tricuspid. Aortic valve regurgitation is mild. No  aortic stenosis is present.  6. The inferior vena cava is normal in size with greater than 50%  respiratory variability, suggesting right atrial pressure of 3 mmHg.  )      Anesthesia Quick Evaluation

## 2020-05-23 NOTE — Progress Notes (Signed)
Anesthesia Chart Review: Same day workup  Patient admitted December 2019 with CHF and non-ST elevation myocardial infarction. Cardiac catheterization revealed an 85% circumflex, 90% mid to distal LAD (distal LAD small vessel and not amenable to PCI) and 75% first marginal. Patient had PCI of his left circumflex with drug-eluting stent.  Patient admitted April 2021 with syncope in the setting of nausea and acute renal failure.  Potassium was 6.9.  There was a question of atrial fibrillation on his electrocardiogram but more likely junctional rhythm.  Echocardiogram April 2021 showed ejection fraction 40 to 45%, mild left ventricular hypertrophy, apical hypokinesis, moderate left atrial enlargement, mild mitral regurgitation, mild aortic insufficiency.  Last seen by Dr. Stanford Breed 03/14/2020.  Per note, patient denied dyspnea, chest pain, palpitations or syncope.  ASA 81 and statin were restarted after the have been discontinued during recent admission.  Otherwise no changes to management were made.  Uncontrolled IDDM 2.  Last A1c 14.7 on 11/22/2019.  Surgery previously canceled for CBG >400 on DOS.  ESRD on HD via right IJ TDC.  We will need day of surgery labs evaluation.  EKG 03/14/2020: Sinus rhythm with sinus arrhythmia with first-degree AV block.  TTE 11/23/19: 1. Left ventricular ejection fraction, by estimation, is 40 to 45%. The  left ventricle has mildly decreased function. The left ventricle  demonstrates regional wall motion abnormalities (see scoring  diagram/findings for description). There is mild  concentric left ventricular hypertrophy. Indeterminate diastolic filling  due to E-A fusion. There is severe hypokinesis of the left ventricular,  entire apical segment.  2. Right ventricular systolic function is normal. The right ventricular  size is normal. Tricuspid regurgitation signal is inadequate for assessing  PA pressure.  3. Left atrial size was moderately dilated.  4. The  mitral valve is normal in structure. Mild mitral valve  regurgitation.  5. The aortic valve is tricuspid. Aortic valve regurgitation is mild. No  aortic stenosis is present.  6. The inferior vena cava is normal in size with greater than 50%  respiratory variability, suggesting right atrial pressure of 3 mmHg.    [place], [place] [facility] Short Stay Center/Anesthesiology Phone [phone] 05/23/2020 1:55 PM

## 2020-05-24 ENCOUNTER — Encounter (HOSPITAL_COMMUNITY): Payer: Self-pay | Admitting: Vascular Surgery

## 2020-05-24 ENCOUNTER — Other Ambulatory Visit: Payer: Self-pay

## 2020-05-24 ENCOUNTER — Encounter (HOSPITAL_COMMUNITY)
Admission: RE | Disposition: A | Discharge status: Home / Self Care | Payer: Self-pay | Source: Home / Self Care | Attending: Vascular Surgery

## 2020-05-24 ENCOUNTER — Ambulatory Visit (HOSPITAL_COMMUNITY): Payer: Medicare Other | Admitting: Physician Assistant

## 2020-05-24 ENCOUNTER — Ambulatory Visit (HOSPITAL_COMMUNITY)
Admission: RE | Admit: 2020-05-24 | Discharge: 2020-05-24 | Disposition: A | Discharge status: Home / Self Care | Payer: Medicare Other | Attending: Vascular Surgery | Admitting: Vascular Surgery

## 2020-05-24 DIAGNOSIS — Z87891 Personal history of nicotine dependence: Secondary | ICD-10-CM | POA: Insufficient documentation

## 2020-05-24 DIAGNOSIS — N186 End stage renal disease: Secondary | ICD-10-CM | POA: Diagnosis not present

## 2020-05-24 DIAGNOSIS — Z6830 Body mass index (BMI) 30.0-30.9, adult: Secondary | ICD-10-CM | POA: Insufficient documentation

## 2020-05-24 DIAGNOSIS — T82868A Thrombosis of vascular prosthetic devices, implants and grafts, initial encounter: Secondary | ICD-10-CM | POA: Diagnosis not present

## 2020-05-24 DIAGNOSIS — E669 Obesity, unspecified: Secondary | ICD-10-CM | POA: Insufficient documentation

## 2020-05-24 DIAGNOSIS — N185 Chronic kidney disease, stage 5: Secondary | ICD-10-CM | POA: Diagnosis not present

## 2020-05-24 DIAGNOSIS — I120 Hypertensive chronic kidney disease with stage 5 chronic kidney disease or end stage renal disease: Secondary | ICD-10-CM | POA: Insufficient documentation

## 2020-05-24 DIAGNOSIS — F419 Anxiety disorder, unspecified: Secondary | ICD-10-CM | POA: Diagnosis not present

## 2020-05-24 DIAGNOSIS — E1122 Type 2 diabetes mellitus with diabetic chronic kidney disease: Secondary | ICD-10-CM | POA: Insufficient documentation

## 2020-05-24 DIAGNOSIS — E1151 Type 2 diabetes mellitus with diabetic peripheral angiopathy without gangrene: Secondary | ICD-10-CM | POA: Diagnosis not present

## 2020-05-24 DIAGNOSIS — Z794 Long term (current) use of insulin: Secondary | ICD-10-CM | POA: Insufficient documentation

## 2020-05-24 DIAGNOSIS — X58XXXA Exposure to other specified factors, initial encounter: Secondary | ICD-10-CM | POA: Diagnosis not present

## 2020-05-24 DIAGNOSIS — Z79899 Other long term (current) drug therapy: Secondary | ICD-10-CM | POA: Diagnosis not present

## 2020-05-24 DIAGNOSIS — I252 Old myocardial infarction: Secondary | ICD-10-CM | POA: Diagnosis not present

## 2020-05-24 DIAGNOSIS — Z992 Dependence on renal dialysis: Secondary | ICD-10-CM | POA: Insufficient documentation

## 2020-05-24 DIAGNOSIS — I2510 Atherosclerotic heart disease of native coronary artery without angina pectoris: Secondary | ICD-10-CM | POA: Insufficient documentation

## 2020-05-24 DIAGNOSIS — F32A Depression, unspecified: Secondary | ICD-10-CM | POA: Diagnosis not present

## 2020-05-24 HISTORY — PX: REVISON OF ARTERIOVENOUS FISTULA: SHX6074

## 2020-05-24 LAB — GLUCOSE, CAPILLARY
Glucose-Capillary: 182 mg/dL — ABNORMAL HIGH (ref 70–99)
Glucose-Capillary: 160 mg/dL — ABNORMAL HIGH (ref 70–99)
Glucose-Capillary: 161 mg/dL — ABNORMAL HIGH (ref 70–99)
Glucose-Capillary: 159 mg/dL — ABNORMAL HIGH (ref 70–99)

## 2020-05-24 LAB — POCT I-STAT, CHEM 8
BUN: 33 mg/dL — ABNORMAL HIGH (ref 6–20)
Calcium, Ion: 1.04 mmol/L — ABNORMAL LOW (ref 1.15–1.40)
Chloride: 99 mmol/L (ref 98–111)
Creatinine, Ser: 5.2 mg/dL — ABNORMAL HIGH (ref 0.61–1.24)
Glucose, Bld: 164 mg/dL — ABNORMAL HIGH (ref 70–99)
HCT: 37 % — ABNORMAL LOW (ref 39.0–52.0)
Hemoglobin: 12.6 g/dL — ABNORMAL LOW (ref 13.0–17.0)
Potassium: 4 mmol/L (ref 3.5–5.1)
Sodium: 140 mmol/L (ref 135–145)
TCO2: 28 mmol/L (ref 22–32)

## 2020-05-24 SURGERY — REVISON OF ARTERIOVENOUS FISTULA
Anesthesia: Monitor Anesthesia Care | Site: Arm Upper | Laterality: Left

## 2020-05-24 MED ORDER — 0.9 % SODIUM CHLORIDE (POUR BTL) OPTIME
TOPICAL | Status: DC | PRN
  Administered 2020-05-24: 1000 mL

## 2020-05-24 MED ORDER — STERILE WATER FOR IRRIGATION IR SOLN
Status: DC | PRN
  Administered 2020-05-24: 1000 mL

## 2020-05-24 MED ORDER — MIDAZOLAM HCL 2 MG/2ML IJ SOLN: 0.5000 mg | Freq: Once | INTRAMUSCULAR | Status: DC | PRN

## 2020-05-24 MED ORDER — CHLORHEXIDINE GLUCONATE 0.12 % MT SOLN
OROMUCOSAL | Status: AC
  Administered 2020-05-24: 15 mL
  Filled 2020-05-24: qty 15

## 2020-05-24 MED ORDER — LIDOCAINE 2% (20 MG/ML) 5 ML SYRINGE
INTRAMUSCULAR | Status: AC
  Filled 2020-05-24: qty 5

## 2020-05-24 MED ORDER — LIDOCAINE HCL (PF) 1 % IJ SOLN
INTRAMUSCULAR | Status: AC
  Filled 2020-05-24: qty 30

## 2020-05-24 MED ORDER — PROMETHAZINE HCL 25 MG/ML IJ SOLN: 6.2500 mg | INTRAMUSCULAR | Status: DC | PRN

## 2020-05-24 MED ORDER — LIDOCAINE-EPINEPHRINE (PF) 1 %-1:200000 IJ SOLN
INTRAMUSCULAR | Status: AC
  Filled 2020-05-24: qty 30

## 2020-05-24 MED ORDER — PROPOFOL 10 MG/ML IV BOLUS
INTRAVENOUS | Status: AC
  Filled 2020-05-24: qty 40

## 2020-05-24 MED ORDER — PROPOFOL 500 MG/50ML IV EMUL
INTRAVENOUS | Status: DC | PRN
  Administered 2020-05-24: 55 ug/kg/min via INTRAVENOUS

## 2020-05-24 MED ORDER — CHLORHEXIDINE GLUCONATE 4 % EX LIQD: 60.0000 mL | Freq: Once | CUTANEOUS | Status: DC

## 2020-05-24 MED ORDER — OXYCODONE HCL 5 MG PO TABS: 5.0000 mg | ORAL_TABLET | Freq: Once | ORAL | Status: DC | PRN

## 2020-05-24 MED ORDER — PHENYLEPHRINE 40 MCG/ML (10ML) SYRINGE FOR IV PUSH (FOR BLOOD PRESSURE SUPPORT)
PREFILLED_SYRINGE | INTRAVENOUS | Status: DC | PRN
  Administered 2020-05-24 (×2): 80 ug via INTRAVENOUS

## 2020-05-24 MED ORDER — OXYCODONE HCL 5 MG/5ML PO SOLN: 5.0000 mg | Freq: Once | ORAL | Status: DC | PRN

## 2020-05-24 MED ORDER — ONDANSETRON HCL 4 MG/2ML IJ SOLN
INTRAMUSCULAR | Status: AC
  Filled 2020-05-24: qty 2

## 2020-05-24 MED ORDER — CEFAZOLIN SODIUM-DEXTROSE 2-4 GM/100ML-% IV SOLN
2.0000 g | INTRAVENOUS | Status: AC
  Administered 2020-05-24: 2 g via INTRAVENOUS

## 2020-05-24 MED ORDER — MIDAZOLAM HCL 5 MG/5ML IJ SOLN
INTRAMUSCULAR | Status: DC | PRN
  Administered 2020-05-24: 2 mg via INTRAVENOUS

## 2020-05-24 MED ORDER — LIDOCAINE-EPINEPHRINE (PF) 1 %-1:200000 IJ SOLN
INTRAMUSCULAR | Status: DC | PRN
  Administered 2020-05-24: 10 mL

## 2020-05-24 MED ORDER — HYDROMORPHONE HCL 1 MG/ML IJ SOLN: 0.2500 mg | INTRAMUSCULAR | Status: DC | PRN

## 2020-05-24 MED ORDER — CEFAZOLIN SODIUM-DEXTROSE 2-4 GM/100ML-% IV SOLN
INTRAVENOUS | Status: AC
  Filled 2020-05-24: qty 100

## 2020-05-24 MED ORDER — FENTANYL CITRATE (PF) 250 MCG/5ML IJ SOLN
INTRAMUSCULAR | Status: AC
  Filled 2020-05-24: qty 5

## 2020-05-24 MED ORDER — SODIUM CHLORIDE 0.9 % IV SOLN
INTRAVENOUS | Status: DC | PRN
  Administered 2020-05-24: 500 mL

## 2020-05-24 MED ORDER — SODIUM CHLORIDE 0.9 % IV SOLN
INTRAVENOUS | Status: AC
  Filled 2020-05-24: qty 1.2

## 2020-05-24 MED ORDER — OXYCODONE-ACETAMINOPHEN 5-325 MG PO TABS: 1.0000 | ORAL_TABLET | ORAL | 0 refills | Status: DC | PRN

## 2020-05-24 MED ORDER — MIDAZOLAM HCL 2 MG/2ML IJ SOLN
INTRAMUSCULAR | Status: AC
  Filled 2020-05-24: qty 2

## 2020-05-24 MED ORDER — MEPERIDINE HCL 25 MG/ML IJ SOLN: 6.2500 mg | INTRAMUSCULAR | Status: DC | PRN

## 2020-05-24 MED ORDER — SODIUM CHLORIDE 0.9 % IV SOLN: INTRAVENOUS | Status: DC

## 2020-05-24 MED ORDER — FENTANYL CITRATE (PF) 100 MCG/2ML IJ SOLN
INTRAMUSCULAR | Status: DC | PRN
  Administered 2020-05-24 (×2): 50 ug via INTRAVENOUS

## 2020-05-24 SURGICAL SUPPLY — 35 items
ADH SKN CLS APL DERMABOND .7 (GAUZE/BANDAGES/DRESSINGS) ×1
ARMBAND PINK RESTRICT EXTREMIT (MISCELLANEOUS) ×3 IMPLANT
CANISTER SUCT 3000ML PPV (MISCELLANEOUS) ×3 IMPLANT
CLIP VESOCCLUDE MED 6/CT (CLIP) ×3 IMPLANT
CLIP VESOCCLUDE SM WIDE 6/CT (CLIP) ×5 IMPLANT
COVER PROBE W GEL 5X96 (DRAPES) ×3 IMPLANT
COVER WAND RF STERILE (DRAPES) ×1 IMPLANT
DERMABOND ADVANCED (GAUZE/BANDAGES/DRESSINGS) ×2
DERMABOND ADVANCED .7 DNX12 (GAUZE/BANDAGES/DRESSINGS) ×1 IMPLANT
ELECT REM PT RETURN 9FT ADLT (ELECTROSURGICAL) ×3
ELECTRODE REM PT RTRN 9FT ADLT (ELECTROSURGICAL) ×1 IMPLANT
GLOVE BIO SURGEON STRL SZ7.5 (GLOVE) ×3 IMPLANT
GLOVE BIO SURGEON STRL SZ8 (GLOVE) ×2 IMPLANT
GLOVE BIOGEL PI IND STRL 6.5 (GLOVE) IMPLANT
GLOVE BIOGEL PI IND STRL 7.0 (GLOVE) IMPLANT
GLOVE BIOGEL PI IND STRL 7.5 (GLOVE) IMPLANT
GLOVE BIOGEL PI INDICATOR 6.5 (GLOVE) ×4
GLOVE BIOGEL PI INDICATOR 7.0 (GLOVE) ×6
GLOVE BIOGEL PI INDICATOR 7.5 (GLOVE) ×2
GOWN STRL REUS W/ TWL LRG LVL3 (GOWN DISPOSABLE) ×2 IMPLANT
GOWN STRL REUS W/ TWL XL LVL3 (GOWN DISPOSABLE) ×1 IMPLANT
GOWN STRL REUS W/TWL LRG LVL3 (GOWN DISPOSABLE) ×9
GOWN STRL REUS W/TWL XL LVL3 (GOWN DISPOSABLE) ×3
KIT BASIN OR (CUSTOM PROCEDURE TRAY) ×3 IMPLANT
KIT TURNOVER KIT B (KITS) ×3 IMPLANT
NS IRRIG 1000ML POUR BTL (IV SOLUTION) ×3 IMPLANT
PACK CV ACCESS (CUSTOM PROCEDURE TRAY) ×3 IMPLANT
PAD ARMBOARD 7.5X6 YLW CONV (MISCELLANEOUS) ×6 IMPLANT
SUT MNCRL AB 4-0 PS2 18 (SUTURE) ×3 IMPLANT
SUT PROLENE 6 0 BV (SUTURE) ×3 IMPLANT
SUT VIC AB 3-0 SH 27 (SUTURE) ×3
SUT VIC AB 3-0 SH 27X BRD (SUTURE) ×1 IMPLANT
TOWEL GREEN STERILE (TOWEL DISPOSABLE) ×3 IMPLANT
UNDERPAD 30X36 HEAVY ABSORB (UNDERPADS AND DIAPERS) ×3 IMPLANT
WATER STERILE IRR 1000ML POUR (IV SOLUTION) ×3 IMPLANT

## 2020-05-24 NOTE — Discharge Instructions (Addendum)
[facility]  Discharge Instructions  AV Fistula or Graft Surgery for Dialysis Access  Please refer to the following instructions for your post-procedure care. Your surgeon or physician assistant will discuss any changes with you.  Activity  You may drive the day following your surgery, if you are comfortable and no longer taking prescription pain medication. Resume full activity as the soreness in your incision resolves.  Bathing/Showering  You may shower after you go home. Keep your incision dry for 48 hours. Do not soak in a bathtub, hot tub, or swim until the incision heals completely. You may not shower if you have a hemodialysis catheter.  Incision Care  Clean your incision with mild soap and water after 48 hours. Pat the area dry with a clean towel. You do not need a bandage unless otherwise instructed. Do not apply any ointments or creams to your incision. You may have skin glue on your incision. Do not peel it off. It will come off on its own in about one week. Your arm may swell a bit after surgery. To reduce swelling use pillows to elevate your arm so it is above your heart. Your doctor will tell you if you need to lightly wrap your arm with an ACE bandage.  Diet  Resume your normal diet. There are not special food restrictions following this procedure. In order to heal from your surgery, it is CRITICAL to get adequate nutrition. Your body requires vitamins, minerals, and protein. Vegetables are the best source of vitamins and minerals. Vegetables also provide the perfect balance of protein. Processed food has little nutritional value, so try to avoid this.  Medications  Resume taking all of your medications. If your incision is causing pain, you may take over-the counter pain relievers such as acetaminophen (Tylenol). If you were prescribed a stronger pain medication, please be aware these medications can cause nausea and constipation. Prevent  nausea by taking the medication with a snack or meal. Avoid constipation by drinking plenty of fluids and eating foods with high amount of fiber, such as fruits, vegetables, and grains.  Do not take Tylenol if you are taking prescription pain medications.  Follow up Your surgeon may want to see you in the office following your access surgery. If so, this will be arranged at the time of your surgery.  Please call us immediately for any of the following conditions:  . Increased pain, redness, drainage (pus) from your incision site . Fever of 101 degrees or higher . Severe or worsening pain at your incision site . Hand pain or numbness. .  Reduce your risk of vascular disease:  . Stop smoking. If you would like help, call [facility] at 1-800-QUIT-NOW (802) 210-2901) or [facility] at 579-105-4482  . Manage your cholesterol . Maintain a desired weight . Control your diabetes . Keep your blood pressure down  Dialysis  It will take several weeks to several months for your new dialysis access to be ready for use. Your surgeon will determine when it is okay to use it. Your nephrologist will continue to direct your dialysis. You can continue to use your Permcath until your new access is ready for use.   05/24/2020 Edward [name] [id number] 1971-01-25  Surgeon(s): Waynetta Sandy, MD  Procedure(s): LEFT ARM ARTERIOVENOUS FISTULA  CONVERSION TO BASILIC VEIN FISTULA   May stick graft immediately   May stick graft on designated area only:   X Do not stick left AV fistula for  12 weeks    If you have any questions, please call the office at [phone].

## 2020-05-24 NOTE — Transfer of Care (Signed)
Immediate Anesthesia Transfer of Care Note  Patient: Edward [name]  Procedure(s) Performed: LEFT ARM ARTERIOVENOUS FISTULA  CONVERSION TO BASILIC VEIN FISTULA (Left Arm Upper)  Patient Location: PACU  Anesthesia Type:MAC  Level of Consciousness: awake, alert  and oriented  Airway & Oxygen Therapy: Patient Spontanous Breathing  Post-op Assessment: Report given to RN, Post -op Vital signs reviewed and stable and Patient moving all extremities  Post vital signs: Reviewed and stable  Last Vitals:  Vitals Value Taken Time  BP    Temp    Pulse 75 05/24/20 0959  Resp 23 05/24/20 0959  SpO2 89 % 05/24/20 0959  Vitals shown include unvalidated device data.  Last Pain:  Vitals:   05/24/20 0724  PainSc: 0-No pain         Complications: No complications documented.

## 2020-05-24 NOTE — Anesthesia Postprocedure Evaluation (Signed)
Anesthesia Post Note  Patient: CASHEL BELLINA  Procedure(s) Performed: LEFT ARM ARTERIOVENOUS FISTULA  CONVERSION TO BASILIC VEIN FISTULA (Left Arm Upper)     Patient location during evaluation: PACU Anesthesia Type: MAC Level of consciousness: awake and alert, patient cooperative and oriented Pain management: pain level controlled Vital Signs Assessment: post-procedure vital signs reviewed and stable Respiratory status: nonlabored ventilation, spontaneous breathing and respiratory function stable Cardiovascular status: stable and blood pressure returned to baseline Postop Assessment: no apparent nausea or vomiting Anesthetic complications: no Comments: Pt with occasional PAC on EKG, BP stable, VSS     No complications documented.  Last Vitals:  Vitals:   05/24/20 1045 05/24/20 1100  BP: (!) 149/96 (!) 138/99  Pulse: 71 86  Resp: 16 19  Temp: (!) 36.4 C (!) 36.4 C  SpO2: 96% 98%    Last Pain:  Vitals:   05/24/20 1100  PainSc: 0-No pain                 Basha Krygier,E. Karema Tocci

## 2020-05-24 NOTE — Op Note (Signed)
    Patient name: Edward [name] MRN: [id number] DOB: 1970-10-26 Sex: male  05/24/2020 Pre-operative Diagnosis: esrd Post-operative diagnosis:  Same Surgeon:  Erlene Quan C. Donzetta Matters, MD Assistant: Paulo Fruit, PA Procedure Performed:  Left arm first stage basilic vein fistula creation  Indications: 49 year old male with history of end-stage renal disease he dialyzes via right IJ catheter.  He has a left arm cephalic vein fistula that has failed to mature.  He is indicated for revision versus graft versus conversion to basilic vein fistula.  Findings: Fistula throughout its course in the upper arm was sclerotic and was actually thrombosed just after the anastomosis.  I elected to perform new fistula with a basilic vein that measured approximately 5 mm external diameter the artery was free of disease.  At completion there was a very strong thrill and a palpable radial artery pulse at the wrist confirmed with Doppler.   Procedure:  The patient was identified in the holding area and taken to the operating room where he was placed supine on the operative table and MAC anesthesia was induced.  He was sterilely prepped and draped in the left upper extremity usual fashion antibiotics were administered and a timeout was called.  We used ultrasound we identified the cephalic vein appeared to be mostly thrombosed and quite sclerotic.  The basilic vein in the upper arm was very large did branch to the antecubitum.  We anesthetized the area above the antecubitum made a transverse incision between the vein and the artery.  We dissected down to the vein we protected the nerve throughout its course.  The vein was marked for orientation.  Branches were divided between clips and ties.  We dissected to the deep fascia the artery placed a vessel loop around this.  The vein was clamped distally transected tied off.  We flushed with heparinized saline spatulated and clamped it.  The arteries clamped distally proximally opened  longitudinally flushed with heparinized saline both directions.  The vein was then inset with 6-0 Prolene suture.  Prior completion without flushing all directions.  Upon completion there was a very strong thrill in the vein.  There was a very strong radial artery pulse at the wrist.  Both of these were confirmed with Doppler.  We irrigated the wound and obtain hemostasis and closed in layers with Vicryl and Monocryl.  Dermabond is placed at the level of the skin.  He was awakened from anesthesia having tolerated procedure without any complication.  All counts were correct at completion.  EBL: 20 cc     Dua Mehler C. Donzetta Matters, MD [facility] [place] Office: [phone] Pager: [phone]

## 2020-05-24 NOTE — H&P (Signed)
HPI:  This is a 49 y.o. male who is s/p leftbrachiocephalic AV fistula and right IJ TDC by Dr. Donnetta Hutching on 11/25/19.  He was seen on Jan 04, 2020 in follow-up.  His fistula was not yet of adequate diameter for access.  He was instructed to continue exercising his upper extremity.  No complaints of hand pain, numbness or tingling.  Dialyzing via [facility] without complications.   Dialysis days:  TTS, 12:30 pm seat time Dialysis center:  [facility] clinic  S/p circumflex coronary artery DES 07/29/2018 by Dr. Ellyn Hack; on Brilinta, asa, statin No Known Allergies        Current Outpatient Medications  Medication Sig Dispense Refill  . acetaminophen (TYLENOL) 325 MG tablet Take 1-2 tablets (325-650 mg total) by mouth every 6 (six) hours as needed for mild pain (pain score 1-3 or temp > 100.5).    Marland Kitchen blood glucose meter kit and supplies Dispense based on patient and insurance preference. Use up to four times daily as directed. (FOR ICD-9 250.00, 250.01). 1 each 0  . hydrALAZINE (APRESOLINE) 25 MG tablet Take 1 tablet (25 mg total) by mouth every 8 (eight) hours. 90 tablet 0  . insulin aspart protamine - aspart (NOVOLOG MIX 70/30 FLEXPEN) (70-30) 100 UNIT/ML FlexPen Inject 0.15 mLs (15 Units total) into the skin 2 (two) times daily with a meal. (Patient taking differently: Inject 20 Units into the skin 2 (two) times daily as needed (high blood sugar). ) 15 mL 11  . Insulin Pen Needle 31G X 5 MM MISC Use daily to inject insulin as instructed 100 each 2  . LANTUS SOLOSTAR 100 UNIT/ML Solostar Pen Inject 20 Units into the skin 2 (two) times daily.    . metoprolol tartrate (LOPRESSOR) 25 MG tablet Take 0.5 tablets (12.5 mg total) by mouth 2 (two) times daily. 30 tablet 0  . Multiple Vitamin (MULTIVITAMIN WITH MINERALS) TABS tablet Take 1 tablet by mouth daily. 90 tablet 3  . oxyCODONE-acetaminophen (PERCOCET) 7.5-325 MG tablet Take 1 tablet by mouth every 4 (four) hours as needed for severe pain.  (Patient not taking: Reported on 01/04/2020) 6 tablet 0  . pantoprazole (PROTONIX) 40 MG tablet Take 1 tablet (40 mg total) by mouth daily. 30 tablet 0  . ticagrelor (BRILINTA) 90 MG TABS tablet Take 90 mg by mouth 2 (two) times daily.     No current facility-administered medications for this visit.     ROS:  See HPI     Vitals:   02/18/20 0855  BP: 136/80  Pulse: 71  Resp: 20  Temp: 97.7 F (36.5 C)  SpO2: 99%    Physical Exam:  General appearance: Well-developed, well-nourished male in no apparent distress Cardiac: Heart rate and rhythm are regular Respiratory: Nonlabored Incision: Left AC incision is well-healed. Extremities: Positive thrill and bruit in fistula, however it is not easily palpable.  5 out of 5 left hand grip strength.  2+ radial pulse.  Sensation intact  Dialysis duplex on 02/18/2020 Findings:  +--------------------+----------+-----------------+--------+  AVF         PSV (cm/s)Flow Vol (mL/min)Comments  +--------------------+----------+-----------------+--------+  Native artery inflow  99      539          +--------------------+----------+-----------------+--------+  AVF Anastomosis     191                 +--------------------+----------+-----------------+--------+     +------------+----------+-------------+-----------+--------+  OUTFLOW VEINPSV (cm/s)Diameter (cm)Depth (cm) Describe  +------------+----------+-------------+-----------+--------+  Shoulder  62    0.44     0.65        +------------+----------+-------------+-----------+--------+  Prox UA     71    0.46     0.48        +------------+----------+-------------+-----------+--------+  Mid UA     87    0.41     0.25        +------------+----------+-------------+-----------+--------+  Dist UA    83 / 138  0.45 / 0.77 0.34 / 0.35        +------------+----------+-------------+-----------+--------+  AC Fossa    177    0.48     0.41        +------------+----------+-------------+-----------+--------+    Summary:  Patent left Brachial-cephalic fistula with no visualized stenosis.  Decreased velocity in the outflow vein on proximally  Assessment/Plan: -The patient is postop left brachiocephalic AV fistula.  Diameter and depth of the fistula not amenable to cannulation, tight stenosis  -plan for revision of left arm avf vs new left arm avf or avg.  Matha Masse C. Donzetta Matters, MD [facility] [place] Office: [phone] Pager: [phone]

## 2020-05-25 ENCOUNTER — Encounter (HOSPITAL_COMMUNITY): Payer: Self-pay | Admitting: Vascular Surgery

## 2020-05-25 ENCOUNTER — Other Ambulatory Visit: Payer: Self-pay | Admitting: Nephrology

## 2020-05-25 DIAGNOSIS — N186 End stage renal disease: Secondary | ICD-10-CM

## 2020-05-29 ENCOUNTER — Ambulatory Visit: Payer: Medicaid Other | Admitting: Podiatry

## 2020-06-09 ENCOUNTER — Ambulatory Visit
Admission: RE | Admit: 2020-06-09 | Discharge: 2020-06-09 | Disposition: A | Discharge status: Home / Self Care | Payer: Medicare Other | Source: Ambulatory Visit | Attending: Nephrology | Admitting: Nephrology

## 2020-06-09 DIAGNOSIS — N186 End stage renal disease: Secondary | ICD-10-CM

## 2020-06-13 ENCOUNTER — Other Ambulatory Visit: Payer: Self-pay

## 2020-06-13 DIAGNOSIS — Z992 Dependence on renal dialysis: Secondary | ICD-10-CM

## 2020-06-13 DIAGNOSIS — N186 End stage renal disease: Secondary | ICD-10-CM

## 2020-06-22 DIAGNOSIS — N186 End stage renal disease: Secondary | ICD-10-CM | POA: Insufficient documentation

## 2020-06-22 DIAGNOSIS — Z992 Dependence on renal dialysis: Secondary | ICD-10-CM | POA: Insufficient documentation

## 2020-06-22 DIAGNOSIS — Z955 Presence of coronary angioplasty implant and graft: Secondary | ICD-10-CM | POA: Insufficient documentation

## 2020-06-28 ENCOUNTER — Ambulatory Visit (INDEPENDENT_AMBULATORY_CARE_PROVIDER_SITE_OTHER): Payer: Self-pay | Admitting: Physician Assistant

## 2020-06-28 ENCOUNTER — Other Ambulatory Visit: Payer: Self-pay

## 2020-06-28 ENCOUNTER — Ambulatory Visit (HOSPITAL_COMMUNITY)
Admission: RE | Admit: 2020-06-28 | Discharge: 2020-06-28 | Disposition: A | Discharge status: Home / Self Care | Payer: Medicare Other | Source: Ambulatory Visit | Attending: Physician Assistant | Admitting: Physician Assistant

## 2020-06-28 ENCOUNTER — Encounter: Payer: Self-pay | Admitting: Physician Assistant

## 2020-06-28 VITALS — BP 126/77 | HR 76 | Temp 97.7°F | Resp 20 | Ht 73.0 in | Wt 251.9 lb

## 2020-06-28 DIAGNOSIS — Z992 Dependence on renal dialysis: Secondary | ICD-10-CM | POA: Diagnosis not present

## 2020-06-28 DIAGNOSIS — N186 End stage renal disease: Secondary | ICD-10-CM | POA: Diagnosis not present

## 2020-06-28 NOTE — H&P (View-Only) (Signed)
Postoperative Access Visit   History of Present Illness   Edward [name] is a 49 y.o. year old male who presents for postoperative follow-up for a left arm first stage basilic vein fistula by Dr. Donzetta Matters on 05/24/20. Patient has history of left arm cephalic vein fistula that failed to Mature. He was scheduled for revision vs graft vs conversion to basilic vein fistula. During the operation the fistula throughout its course in the upper arm was sclerotic and was actually thrombosed just after the anastomosis so it was determined that a new basilic vein fistula would be best.   The patient's wounds are well healed.  The patient notes no steal symptoms. He has had some intermittent burning like sensation in the left forearm after dialysis but otherwise denies any pain  He currently dialyzes via right IJ TDC on Tuesday, Thursday, Saturday at the [facility] location  Physical Examination   Vitals:   06/28/20 1108  BP: 126/77  Pulse: 76  Resp: 20  Temp: 97.7 F (36.5 C)  TempSrc: Temporal  SpO2: 99%  Weight: 251 lb 14.4 oz (114.3 kg)  Height: 6\' 1"  (1.854 m)   Body mass index is 33.23 kg/m.  left arm Incision is well healed, 2+ radial pulse, hand grip is 5/5, sensation in digits is intact, palpable thrill, bruit can  be auscultated. The left BV fistula is too deep in the left upper arm    Non invasive vascular lab: 06/28/20 Findings:  +--------------------+----------+-----------------+--------+  AVF         PSV (cm/s)Flow Vol (mL/min)Comments  +--------------------+----------+-----------------+--------+  Native artery inflow  258     1076          +--------------------+----------+-----------------+--------+  AVF Anastomosis     653                 +--------------------+----------+-----------------+--------+   +------------+----------+-------------+----------+------------------------+   OUTFLOW VEINPSV (cm/s)Diameter  (cm)Depth (cm)    Describe        +------------+----------+-------------+----------+------------------------+   Prox UA     151    0.61     1.20  joins into brachial  vein  +------------+----------+-------------+----------+------------------------+   Mid UA     278    0.67     0.76    competing branch      +------------+----------+-------------+----------+------------------------+   Dist UA     583    0.72     1.09                  +------------+----------+-------------+----------+------------------------+   AC Fossa                1.91                  +------------+----------+-------------+----------+------------------------+   Medical Decision Making   Edward [name] is a 49 y.o. year old male who presents s/p left arm first stage basilic vein fistula by Dr. Donzetta Matters on 05/24/20. Duplex shows good volume flow, adequate diameter of the basilic vein but the fistula is too deep for access in the left upper arm.I have discussed with him the need for a second stage transposition of the vein to bring it closer to the surface for easy access for dialysis.He currently dialyzes Tues/ Thurs/ Sat via right IJ TDC   Patent is without signs or symptoms of steal syndrome  The patient's access will be ready for use 08/25/19. Will try to get his second stage transposition scheduled so that he can be healed by this date  I have scheduled him for a left second stage transposition with Dr. Donzetta Matters. This will tried to be scheduled on non dialysis day however if necessary dialysis schedule can be changed to accommodate surgery scheduling   Edward Caldwell, PA-C [facility] [place] Office: [phone]  Clinic MD: Dr. Oneida Alar

## 2020-06-28 NOTE — Progress Notes (Signed)
Postoperative Access Visit   History of Present Illness   Edward [name] is a 49 y.o. year old male who presents for postoperative follow-up for a left arm first stage basilic vein fistula by Dr. Donzetta Matters on 05/24/20. Patient has history of left arm cephalic vein fistula that failed to Mature. He was scheduled for revision vs graft vs conversion to basilic vein fistula. During the operation the fistula throughout its course in the upper arm was sclerotic and was actually thrombosed just after the anastomosis so it was determined that a new basilic vein fistula would be best.   The patient's wounds are well healed.  The patient notes no steal symptoms. He has had some intermittent burning like sensation in the left forearm after dialysis but otherwise denies any pain  He currently dialyzes via right IJ TDC on Tuesday, Thursday, Saturday at the [facility] location  Physical Examination   Vitals:   06/28/20 1108  BP: 126/77  Pulse: 76  Resp: 20  Temp: 97.7 F (36.5 C)  TempSrc: Temporal  SpO2: 99%  Weight: 251 lb 14.4 oz (114.3 kg)  Height: 6\' 1"  (1.854 m)   Body mass index is 33.23 kg/m.  left arm Incision is well healed, 2+ radial pulse, hand grip is 5/5, sensation in digits is intact, palpable thrill, bruit can  be auscultated. The left BV fistula is too deep in the left upper arm    Non invasive vascular lab: 06/28/20 Findings:  +--------------------+----------+-----------------+--------+  AVF         PSV (cm/s)Flow Vol (mL/min)Comments  +--------------------+----------+-----------------+--------+  Native artery inflow  258     1076          +--------------------+----------+-----------------+--------+  AVF Anastomosis     653                 +--------------------+----------+-----------------+--------+   +------------+----------+-------------+----------+------------------------+   OUTFLOW VEINPSV (cm/s)Diameter  (cm)Depth (cm)    Describe        +------------+----------+-------------+----------+------------------------+   Prox UA     151    0.61     1.20  joins into brachial  vein  +------------+----------+-------------+----------+------------------------+   Mid UA     278    0.67     0.76    competing branch      +------------+----------+-------------+----------+------------------------+   Dist UA     583    0.72     1.09                  +------------+----------+-------------+----------+------------------------+   AC Fossa                1.91                  +------------+----------+-------------+----------+------------------------+   Medical Decision Making   Edward [name] is a 49 y.o. year old male who presents s/p left arm first stage basilic vein fistula by Dr. Donzetta Matters on 05/24/20. Duplex shows good volume flow, adequate diameter of the basilic vein but the fistula is too deep for access in the left upper arm.I have discussed with him the need for a second stage transposition of the vein to bring it closer to the surface for easy access for dialysis.He currently dialyzes Tues/ Thurs/ Sat via right IJ TDC   Patent is without signs or symptoms of steal syndrome  The patient's access will be ready for use 08/25/19. Will try to get his second stage transposition scheduled so that he can be healed by this date  I have scheduled him for a left second stage transposition with Dr. Donzetta Matters. This will tried to be scheduled on non dialysis day however if necessary dialysis schedule can be changed to accommodate surgery scheduling   Karoline Caldwell, PA-C [facility] [place] Office: [phone]  Clinic MD: Dr. Oneida Alar

## 2020-07-17 ENCOUNTER — Other Ambulatory Visit: Payer: Self-pay

## 2020-07-17 ENCOUNTER — Encounter (HOSPITAL_COMMUNITY): Payer: Self-pay | Admitting: Vascular Surgery

## 2020-07-17 ENCOUNTER — Other Ambulatory Visit (HOSPITAL_COMMUNITY)
Admission: RE | Admit: 2020-07-17 | Discharge: 2020-07-17 | Disposition: A | Discharge status: Home / Self Care | Payer: Medicare Other | Source: Ambulatory Visit | Attending: Vascular Surgery | Admitting: Vascular Surgery

## 2020-07-17 DIAGNOSIS — Z20822 Contact with and (suspected) exposure to covid-19: Secondary | ICD-10-CM | POA: Diagnosis not present

## 2020-07-17 DIAGNOSIS — Z01812 Encounter for preprocedural laboratory examination: Secondary | ICD-10-CM | POA: Diagnosis present

## 2020-07-17 LAB — SARS CORONAVIRUS 2 (TAT 6-24 HRS): SARS Coronavirus 2: NEGATIVE

## 2020-07-17 NOTE — Progress Notes (Signed)
Mr. Thornsberry denies chest pain or shortness of breath. Patient tested negative for Covid today and has been in quarantine since that time.  Mr. Milles has type II diabetes. I instructed patient to take 1/2 of Lantus tonight -10 units and 10 units of Lantus in am if CBG is greater than 70.  Mr. Kon reports that CBGs run 100's - 300's. I educated Mr. Janis that to prevent healing and prevent infection. I instructed patient to check CBG after awaking and every 2 hours until arrival  to the hospital.  I Instructed patient if CBG is less than 70 to drink 1/2 cup of a clear juice. Recheck CBG in 15 minutes then call pre- op desk at [phone] for further instructions. If scheduled to receive Insulin, do not take Insulin

## 2020-07-19 ENCOUNTER — Ambulatory Visit (HOSPITAL_COMMUNITY): Payer: Medicare Other | Admitting: Anesthesiology

## 2020-07-19 ENCOUNTER — Ambulatory Visit (HOSPITAL_COMMUNITY)
Admission: RE | Admit: 2020-07-19 | Discharge: 2020-07-19 | Disposition: A | Discharge status: Home / Self Care | Payer: Medicare Other | Attending: Vascular Surgery | Admitting: Vascular Surgery

## 2020-07-19 ENCOUNTER — Encounter (HOSPITAL_COMMUNITY): Payer: Self-pay | Admitting: Vascular Surgery

## 2020-07-19 ENCOUNTER — Encounter (HOSPITAL_COMMUNITY)
Admission: RE | Disposition: A | Discharge status: Home / Self Care | Payer: Self-pay | Source: Home / Self Care | Attending: Vascular Surgery

## 2020-07-19 ENCOUNTER — Other Ambulatory Visit: Payer: Self-pay

## 2020-07-19 DIAGNOSIS — Z992 Dependence on renal dialysis: Secondary | ICD-10-CM | POA: Insufficient documentation

## 2020-07-19 DIAGNOSIS — N185 Chronic kidney disease, stage 5: Secondary | ICD-10-CM

## 2020-07-19 DIAGNOSIS — N186 End stage renal disease: Secondary | ICD-10-CM | POA: Insufficient documentation

## 2020-07-19 HISTORY — PX: BASCILIC VEIN TRANSPOSITION: SHX5742

## 2020-07-19 HISTORY — DX: Dyspnea, unspecified: R06.00

## 2020-07-19 LAB — GLUCOSE, CAPILLARY
Glucose-Capillary: 137 mg/dL — ABNORMAL HIGH (ref 70–99)
Glucose-Capillary: 100 mg/dL — ABNORMAL HIGH (ref 70–99)

## 2020-07-19 LAB — POCT I-STAT, CHEM 8
BUN: 47 mg/dL — ABNORMAL HIGH (ref 6–20)
Calcium, Ion: 1 mmol/L — ABNORMAL LOW (ref 1.15–1.40)
Chloride: 98 mmol/L (ref 98–111)
Creatinine, Ser: 6.9 mg/dL — ABNORMAL HIGH (ref 0.61–1.24)
Glucose, Bld: 122 mg/dL — ABNORMAL HIGH (ref 70–99)
HCT: 33 % — ABNORMAL LOW (ref 39.0–52.0)
Hemoglobin: 11.2 g/dL — ABNORMAL LOW (ref 13.0–17.0)
Potassium: 4.1 mmol/L (ref 3.5–5.1)
Sodium: 141 mmol/L (ref 135–145)
TCO2: 28 mmol/L (ref 22–32)

## 2020-07-19 SURGERY — TRANSPOSITION, VEIN, BASILIC
Anesthesia: General | Laterality: Left

## 2020-07-19 MED ORDER — FENTANYL CITRATE (PF) 100 MCG/2ML IJ SOLN: 25.0000 ug | INTRAMUSCULAR | Status: DC | PRN

## 2020-07-19 MED ORDER — OXYCODONE HCL 5 MG/5ML PO SOLN
5.0000 mg | Freq: Once | ORAL | Status: AC | PRN
  Administered 2020-07-19: 5 mg via ORAL

## 2020-07-19 MED ORDER — CHLORHEXIDINE GLUCONATE 0.12 % MT SOLN
OROMUCOSAL | Status: AC
  Filled 2020-07-19: qty 15

## 2020-07-19 MED ORDER — FENTANYL CITRATE (PF) 100 MCG/2ML IJ SOLN
INTRAMUSCULAR | Status: DC | PRN
  Administered 2020-07-19: 100 ug via INTRAVENOUS

## 2020-07-19 MED ORDER — PHENYLEPHRINE HCL-NACL 10-0.9 MG/250ML-% IV SOLN
INTRAVENOUS | Status: DC | PRN
  Administered 2020-07-19: 25 ug/min via INTRAVENOUS

## 2020-07-19 MED ORDER — FENTANYL CITRATE (PF) 250 MCG/5ML IJ SOLN
INTRAMUSCULAR | Status: AC
  Filled 2020-07-19: qty 5

## 2020-07-19 MED ORDER — PROPOFOL 10 MG/ML IV BOLUS
INTRAVENOUS | Status: DC | PRN
  Administered 2020-07-19: 200 mg via INTRAVENOUS

## 2020-07-19 MED ORDER — SODIUM CHLORIDE 0.9 % IV SOLN: INTRAVENOUS | Status: DC | PRN

## 2020-07-19 MED ORDER — LIDOCAINE-EPINEPHRINE (PF) 1 %-1:200000 IJ SOLN
INTRAMUSCULAR | Status: AC
  Filled 2020-07-19: qty 30

## 2020-07-19 MED ORDER — CHLORHEXIDINE GLUCONATE 4 % EX LIQD: 60.0000 mL | Freq: Once | CUTANEOUS | Status: DC

## 2020-07-19 MED ORDER — LIDOCAINE 2% (20 MG/ML) 5 ML SYRINGE
INTRAMUSCULAR | Status: DC | PRN
  Administered 2020-07-19: 30 mg via INTRAVENOUS

## 2020-07-19 MED ORDER — OXYCODONE HCL 5 MG/5ML PO SOLN
ORAL | Status: AC
  Filled 2020-07-19: qty 5

## 2020-07-19 MED ORDER — SODIUM CHLORIDE 0.9 % IV SOLN: INTRAVENOUS | Status: DC

## 2020-07-19 MED ORDER — CEFAZOLIN SODIUM-DEXTROSE 2-4 GM/100ML-% IV SOLN
INTRAVENOUS | Status: AC
  Filled 2020-07-19: qty 100

## 2020-07-19 MED ORDER — OXYCODONE HCL 5 MG PO TABS: 5.0000 mg | ORAL_TABLET | Freq: Once | ORAL | Status: AC | PRN

## 2020-07-19 MED ORDER — SODIUM CHLORIDE 0.9 % IV SOLN
INTRAVENOUS | Status: AC
  Filled 2020-07-19: qty 1.2

## 2020-07-19 MED ORDER — CEFAZOLIN SODIUM-DEXTROSE 2-4 GM/100ML-% IV SOLN
2.0000 g | INTRAVENOUS | Status: AC
  Administered 2020-07-19: 2 g via INTRAVENOUS

## 2020-07-19 MED ORDER — 0.9 % SODIUM CHLORIDE (POUR BTL) OPTIME
TOPICAL | Status: DC | PRN
  Administered 2020-07-19: 1000 mL

## 2020-07-19 MED ORDER — PHENYLEPHRINE HCL (PRESSORS) 10 MG/ML IV SOLN
INTRAVENOUS | Status: DC | PRN
  Administered 2020-07-19: 100 ug via INTRAVENOUS

## 2020-07-19 MED ORDER — ONDANSETRON HCL 4 MG/2ML IJ SOLN: 4.0000 mg | Freq: Once | INTRAMUSCULAR | Status: DC | PRN

## 2020-07-19 MED ORDER — OXYCODONE-ACETAMINOPHEN 5-325 MG PO TABS
1.0000 | ORAL_TABLET | ORAL | 0 refills | Status: DC | PRN
Start: 2020-07-19 — End: 2020-09-29

## 2020-07-19 MED ORDER — PROPOFOL 10 MG/ML IV BOLUS
INTRAVENOUS | Status: AC
  Filled 2020-07-19: qty 20

## 2020-07-19 SURGICAL SUPPLY — 38 items
ADH SKN CLS APL DERMABOND .7 (GAUZE/BANDAGES/DRESSINGS) ×1
ARMBAND PINK RESTRICT EXTREMIT (MISCELLANEOUS) ×3 IMPLANT
CANISTER SUCT 3000ML PPV (MISCELLANEOUS) ×3 IMPLANT
CATH EMB 4FR 40CM (CATHETERS) ×2 IMPLANT
CLIP VESOCCLUDE MED 24/CT (CLIP) ×2 IMPLANT
CLIP VESOCCLUDE MED 6/CT (CLIP) IMPLANT
CLIP VESOCCLUDE SM WIDE 24/CT (CLIP) ×2 IMPLANT
CLIP VESOCCLUDE SM WIDE 6/CT (CLIP) IMPLANT
COVER PROBE W GEL 5X96 (DRAPES) ×1 IMPLANT
COVER WAND RF STERILE (DRAPES) ×3 IMPLANT
DERMABOND ADVANCED (GAUZE/BANDAGES/DRESSINGS) ×2
DERMABOND ADVANCED .7 DNX12 (GAUZE/BANDAGES/DRESSINGS) ×1 IMPLANT
ELECT REM PT RETURN 9FT ADLT (ELECTROSURGICAL)
ELECTRODE REM PT RTRN 9FT ADLT (ELECTROSURGICAL) ×1 IMPLANT
GLOVE BIO SURGEON STRL SZ 6.5 (GLOVE) ×1 IMPLANT
GLOVE BIO SURGEON STRL SZ7.5 (GLOVE) ×3 IMPLANT
GLOVE BIO SURGEONS STRL SZ 6.5 (GLOVE) ×1
GLOVE SURG UNDER POLY LF SZ7 (GLOVE) ×2 IMPLANT
GOWN STRL REUS W/ TWL LRG LVL3 (GOWN DISPOSABLE) ×2 IMPLANT
GOWN STRL REUS W/ TWL XL LVL3 (GOWN DISPOSABLE) ×1 IMPLANT
GOWN STRL REUS W/TWL LRG LVL3 (GOWN DISPOSABLE) ×6
GOWN STRL REUS W/TWL XL LVL3 (GOWN DISPOSABLE) ×3
KIT BASIN OR (CUSTOM PROCEDURE TRAY) ×3 IMPLANT
KIT TURNOVER KIT B (KITS) ×3 IMPLANT
NS IRRIG 1000ML POUR BTL (IV SOLUTION) ×3 IMPLANT
PACK CV ACCESS (CUSTOM PROCEDURE TRAY) ×3 IMPLANT
PAD ARMBOARD 7.5X6 YLW CONV (MISCELLANEOUS) ×6 IMPLANT
SUT MNCRL AB 4-0 PS2 18 (SUTURE) ×3 IMPLANT
SUT PROLENE 6 0 BV (SUTURE) ×5 IMPLANT
SUT SILK 2 0 SH (SUTURE) IMPLANT
SUT SILK 3 0 (SUTURE) ×3
SUT SILK 3-0 18XBRD TIE 12 (SUTURE) IMPLANT
SUT VIC AB 3-0 SH 27 (SUTURE) ×9
SUT VIC AB 3-0 SH 27X BRD (SUTURE) ×1 IMPLANT
SUT VIC AB 4-0 PS2 18 (SUTURE) ×4 IMPLANT
TOWEL GREEN STERILE (TOWEL DISPOSABLE) ×3 IMPLANT
UNDERPAD 30X36 HEAVY ABSORB (UNDERPADS AND DIAPERS) ×3 IMPLANT
WATER STERILE IRR 1000ML POUR (IV SOLUTION) ×3 IMPLANT

## 2020-07-19 NOTE — Discharge Instructions (Signed)
[facility]  Discharge Instructions  AV Fistula or Graft Surgery for Dialysis Access  Please refer to the following instructions for your post-procedure care. Your surgeon or physician assistant will discuss any changes with you.  Activity  You may drive the day following your surgery, if you are comfortable and no longer taking prescription pain medication. Resume full activity as the soreness in your incision resolves.  Bathing/Showering  You may shower after you go home. Keep your incision dry for 48 hours. Do not soak in a bathtub, hot tub, or swim until the incision heals completely. You may not shower if you have a hemodialysis catheter.  Incision Care  Clean your incision with mild soap and water after 48 hours. Pat the area dry with a clean towel. You do not need a bandage unless otherwise instructed. Do not apply any ointments or creams to your incision. You may have skin glue on your incision. Do not peel it off. It will come off on its own in about one week. Your arm may swell a bit after surgery. To reduce swelling use pillows to elevate your arm so it is above your heart. Your doctor will tell you if you need to lightly wrap your arm with an ACE bandage.  Diet  Resume your normal diet. There are not special food restrictions following this procedure. In order to heal from your surgery, it is CRITICAL to get adequate nutrition. Your body requires vitamins, minerals, and protein. Vegetables are the best source of vitamins and minerals. Vegetables also provide the perfect balance of protein. Processed food has little nutritional value, so try to avoid this.  Medications  Resume taking all of your medications. If your incision is causing pain, you may take over-the counter pain relievers such as acetaminophen (Tylenol). If you were prescribed a stronger pain medication, please be aware these medications can cause nausea and constipation. Prevent  nausea by taking the medication with a snack or meal. Avoid constipation by drinking plenty of fluids and eating foods with high amount of fiber, such as fruits, vegetables, and grains.  Do not take Tylenol if you are taking prescription pain medications.  Follow up Your surgeon may want to see you in the office following your access surgery. If so, this will be arranged at the time of your surgery.  Please call us immediately for any of the following conditions:   Increased pain, redness, drainage (pus) from your incision site  Fever of 101 degrees or higher  Severe or worsening pain at your incision site  Hand pain or numbness.   Reduce your risk of vascular disease:   Stop smoking. If you would like help, call [facility] at 1-800-QUIT-NOW 406 591 6259) or [facility] [place] your cholesterol  Maintain a desired weight  Control your diabetes  Keep your blood pressure down  Dialysis  It will take several weeks to several months for your new dialysis access to be ready for use. Your surgeon will determine when it is okay to use it. Your nephrologist will continue to direct your dialysis. You can continue to use your Permcath until your new access is ready for use.   07/19/2020 Edward [name] [id number] [birth year]  Surgeon(s): Waynetta Sandy, MD  Procedure(s): LEFT SECOND STAGE BASCILIC VEIN TRANSPOSITION   May stick graft immediately   May stick graft on designated area only:   X Do not stick left AV fistula for 4 weeks  If you have any questions, please call the office at [phone].

## 2020-07-19 NOTE — Transfer of Care (Signed)
Immediate Anesthesia Transfer of Care Note  Patient: Edward [name]  Procedure(s) Performed: LEFT SECOND STAGE [place] (Left )  Patient Location: PACU  Anesthesia Type:General  Level of Consciousness: drowsy and patient cooperative  Airway & Oxygen Therapy: Patient Spontanous Breathing and Patient connected to nasal cannula oxygen  Post-op Assessment: Report given to RN and Post -op Vital signs reviewed and stable  Post vital signs: Reviewed and stable  Last Vitals:  Vitals Value Taken Time  BP 147/93 07/19/20 1022  Temp    Pulse 69 07/19/20 1024  Resp 21 07/19/20 1024  SpO2 98 % 07/19/20 1024  Vitals shown include unvalidated device data.  Last Pain:  Vitals:   07/19/20 0723  TempSrc:   PainSc: 0-No pain      Patients Stated Pain Goal: 5 (44/03/47 4259)  Complications: No complications documented.

## 2020-07-19 NOTE — Anesthesia Procedure Notes (Signed)
Procedure Name: LMA Insertion Date/Time: 07/19/2020 8:47 AM Performed by: Eligha Bridegroom, CRNA Pre-anesthesia Checklist: Patient identified, Emergency Drugs available, Suction available, Patient being monitored and Timeout performed Patient Re-evaluated:Patient Re-evaluated prior to induction Oxygen Delivery Method: Circle system utilized Preoxygenation: Pre-oxygenation with 100% oxygen Induction Type: IV induction LMA Size: 5.0 Number of attempts: 1 Placement Confirmation: positive ETCO2 and breath sounds checked- equal and bilateral Tube secured with: Tape Dental Injury: Teeth and Oropharynx as per pre-operative assessment

## 2020-07-19 NOTE — OR Nursing (Signed)
Pt is ambulatory to wheelchair with stand by assist but not needed with steady gait.  Pt taken to front lobby and placed in car with family.   Pt left arm fistula thrill/bruit present

## 2020-07-19 NOTE — Anesthesia Postprocedure Evaluation (Signed)
Anesthesia Post Note  Patient: Edward [name]  Procedure(s) Performed: LEFT SECOND STAGE BASCILIC VEIN TRANSPOSITION (Left )     Patient location during evaluation: PACU Anesthesia Type: General Level of consciousness: awake and alert Pain management: pain level controlled Vital Signs Assessment: post-procedure vital signs reviewed and stable Respiratory status: spontaneous breathing, nonlabored ventilation, respiratory function stable and patient connected to nasal cannula oxygen Cardiovascular status: blood pressure returned to baseline and stable Postop Assessment: no apparent nausea or vomiting Anesthetic complications: no   No complications documented.  Last Vitals:  Vitals:   07/19/20 1045 07/19/20 1100  BP: 139/85 (!) 126/99  Pulse: 69 70  Resp: 12 17  Temp:    SpO2: 97% 100%    Last Pain:  Vitals:   07/19/20 1115  TempSrc:   PainSc: 2                  Loveda Colaizzi COKER

## 2020-07-19 NOTE — Anesthesia Preprocedure Evaluation (Signed)
Anesthesia Evaluation  Patient identified by MRN, date of birth, ID band Patient awake    Reviewed: Allergy & Precautions, NPO status , Patient's Chart, lab work & pertinent test results  Airway Mallampati: III  TM Distance: >3 FB Neck ROM: Full    Dental  (+) Teeth Intact, Dental Advisory Given   Pulmonary former smoker,    breath sounds clear to auscultation       Cardiovascular hypertension,  Rhythm:Regular Rate:Normal     Neuro/Psych    GI/Hepatic   Endo/Other  diabetes  Renal/GU      Musculoskeletal   Abdominal   Peds  Hematology   Anesthesia Other Findings   Reproductive/Obstetrics                             Anesthesia Physical Anesthesia Plan  ASA: III  Anesthesia Plan: General   Post-op Pain Management:    Induction: Intravenous  PONV Risk Score and Plan: Ondansetron  Airway Management Planned: LMA  Additional Equipment:   Intra-op Plan:   Post-operative Plan:   Informed Consent: I have reviewed the patients History and Physical, chart, labs and discussed the procedure including the risks, benefits and alternatives for the proposed anesthesia with the patient or authorized representative who has indicated his/her understanding and acceptance.   Dental advisory given  Plan Discussed with: CRNA and Anesthesiologist  Anesthesia Plan Comments:         Anesthesia Quick Evaluation  

## 2020-07-19 NOTE — Op Note (Signed)
    Patient name: JAYME CHAM MRN: [id number] DOB: 30-Aug-1970 Sex: male  07/19/2020 Pre-operative Diagnosis: End-stage renal disease Post-operative diagnosis:  Same Surgeon:  Erlene Quan C. Donzetta Matters, MD Assistant: Paulo Fruit, PA Procedure Performed:  Revision of left arm AV fistula with transposition of basilic vein  Indications: 49 year old male on dialysis via catheter.  He has a previous first stage basilic vein fistula creation.  He is now indicated for second stage.  An assistant was necessary for suction, retraction, assistant with anastomosis and wound closure.  Findings: There is a large vein throughout the upper arm.  There was 1 area we did dilate with a 4 Fogarty this tolerated well.  Completion was a very strong thrill in the fistula and a palpable radial artery pulse at the wrist both confirmed with Doppler.   Procedure:  The patient was identified in the holding area and taken to the operating room where is placed supine on the operative table and LMA anesthesia was induced.  He was sterilely prepped and draped in the left upper extremity usual fashion antibiotics were minister timeout was called.  We open the previous incision dissected down to the fistula itself.  We identified the nerve and protected this throughout its course.  2 separate skip incisions were made.  Branches were divided between clips and ties.  We dissected out the entire fistula up to the axilla we marked it for orientation.  We clamped it near the anastomosis transected.  We then flushed with heparinized saline clamped and flushed again.  There was one area that failed to dilate.  This was dilated with a 4 Fogarty serially and I did dilate well.  We again flushed with heparinized saline marked the entire fistula for orientation tunneled laterally.  We then spatulated both ends and sewed them end-to-end with 6-0 Prolene suture.  Prior completion low flushing all directions.  Point completion there was a very strong  thrill in the fistula and a palpable radial artery pulse at the wrist both confirmed with Doppler.  Satisfied we irrigated the wounds, obtain hemostasis and closed in layers with Vicryl and Monocryl.  Dermabond is placed at the level the skin.  He was awakened from anesthesia having tolerated procedure without any complication.  Counts were correct at completion peer  EBL: 100 cc    Leiam Hopwood C. Donzetta Matters, MD [facility] [place] Office: [phone] Pager: [phone]

## 2020-07-19 NOTE — OR Nursing (Signed)
Pt is awake,alert and oriented. Pt is in NAD at this time. Pt and family verbalized understanding of poc and discharge instructions. instructions given to family and reviewed prior to discharge.   

## 2020-07-19 NOTE — Interval H&P Note (Signed)
History and Physical Interval Note:  07/19/2020 8:20 AM  Edward [name]  has presented today for surgery, with the diagnosis of ESRD.  The various methods of treatment have been discussed with the patient and family. After consideration of risks, benefits and other options for treatment, the patient has consented to  Procedure(s): LEFT SECOND STAGE [place] (Left) as a surgical intervention.  The patient's history has been reviewed, patient examined, no change in status, stable for surgery.  I have reviewed the patient's chart and labs.  Questions were answered to the patient's satisfaction.     Servando Snare

## 2020-07-20 ENCOUNTER — Encounter (HOSPITAL_COMMUNITY): Payer: Self-pay | Admitting: Vascular Surgery

## 2020-07-28 ENCOUNTER — Telehealth: Payer: Self-pay | Admitting: Cardiology

## 2020-07-28 ENCOUNTER — Other Ambulatory Visit: Payer: Self-pay | Admitting: Nephrology

## 2020-07-28 ENCOUNTER — Other Ambulatory Visit: Payer: Self-pay | Admitting: Family Medicine

## 2020-07-28 NOTE — Telephone Encounter (Signed)
Edward [name] with [facility] is states she faxed an order for a stress echo to the office and she is calling to see if we received it.  Phone#: [phone]

## 2020-07-28 NOTE — Telephone Encounter (Signed)
Left message for Edward [name] with [facility] stating it does not appear that we have received the fax in question at this time, but that I would check with Dr. Jacalyn Lefevre primary nurse and get back to her.

## 2020-07-31 NOTE — Telephone Encounter (Signed)
Order found in dr Jacalyn Lefevre mailbox this morning and sent to the echo department for scheduling.

## 2020-08-02 ENCOUNTER — Other Ambulatory Visit: Payer: Self-pay | Admitting: Nephrology

## 2020-08-02 NOTE — Telephone Encounter (Signed)
[name] calling for an update on scheduling echo, connected call to [place].

## 2020-08-03 ENCOUNTER — Other Ambulatory Visit (HOSPITAL_COMMUNITY): Payer: Self-pay | Admitting: Nephrology

## 2020-08-03 ENCOUNTER — Other Ambulatory Visit: Payer: Self-pay | Admitting: Nephrology

## 2020-08-03 DIAGNOSIS — Z7682 Awaiting organ transplant status: Secondary | ICD-10-CM

## 2020-08-03 DIAGNOSIS — N186 End stage renal disease: Secondary | ICD-10-CM

## 2020-08-15 ENCOUNTER — Other Ambulatory Visit (HOSPITAL_COMMUNITY): Payer: Self-pay | Admitting: Nephrology

## 2020-08-15 DIAGNOSIS — Z01810 Encounter for preprocedural cardiovascular examination: Secondary | ICD-10-CM

## 2020-08-15 DIAGNOSIS — R0602 Shortness of breath: Secondary | ICD-10-CM

## 2020-08-16 ENCOUNTER — Telehealth (HOSPITAL_COMMUNITY): Payer: Self-pay | Admitting: Radiology

## 2020-08-16 NOTE — Telephone Encounter (Signed)
Called and spoke with [name], nurse at [facility]. This patient has an right BKA and left metatarsal amputation. It is unsafe and impossible to walk on the treadmill for a stress echocardiogram. She said she would speak with Dr. Justin Mend about changing the order to a Dobutamine stress echo or Myoview Lexiscan. I informed her that all Dobutamine stress echos ordered by a non-cardiologist are reviewed by the department medical director or an echo reading cardiologist for patient safety and test appropriateness. I informed her, I wouldl leave the patient on the schedule for now and wait to hear back from this office.

## 2020-08-21 ENCOUNTER — Other Ambulatory Visit (HOSPITAL_COMMUNITY)
Admission: RE | Admit: 2020-08-21 | Discharge: 2020-08-21 | Disposition: A | Discharge status: Home / Self Care | Payer: Medicare Other | Source: Ambulatory Visit | Attending: Cardiology | Admitting: Cardiology

## 2020-08-21 DIAGNOSIS — Z20822 Contact with and (suspected) exposure to covid-19: Secondary | ICD-10-CM | POA: Diagnosis not present

## 2020-08-21 DIAGNOSIS — Z01818 Encounter for other preprocedural examination: Secondary | ICD-10-CM | POA: Diagnosis present

## 2020-08-22 LAB — SARS CORONAVIRUS 2 (TAT 6-24 HRS): SARS Coronavirus 2: NEGATIVE

## 2020-08-23 ENCOUNTER — Other Ambulatory Visit (HOSPITAL_COMMUNITY): Payer: Medicare Other

## 2020-08-25 ENCOUNTER — Inpatient Hospital Stay: Admission: RE | Admit: 2020-08-25 | Payer: Medicare Other | Source: Ambulatory Visit

## 2020-09-07 ENCOUNTER — Encounter: Payer: Self-pay | Admitting: Physical Therapy

## 2020-09-07 NOTE — Therapy (Signed)
[facility] [address] [place], [place], [address] Phone: [phone]   Fax:  [phone]  Patient Details  Name: Edward [name] MRN: [id number] Date of Birth: 07/31/1971 Referring Provider:  No ref. provider found  Encounter Date: 09/07/2020  PHYSICAL THERAPY DISCHARGE SUMMARY  Visits from Start of Care: 21  Current functional level related to goals / functional outcomes: Unable to formally assess; pt did not return after 21st visit.   Remaining deficits: Impaired balance, impaired gait, impaired strength   Education / Equipment: HEP  Plan: Patient agrees to discharge.  Patient goals were not met. Patient is being discharged due to not returning since the last visit.  ?????      F , PT, DPT 09/07/20    2:29 PM    [facility] [address] [place], [place], [address] Phone: [phone]   Fax:  [phone] 

## 2020-09-13 ENCOUNTER — Encounter (HOSPITAL_COMMUNITY): Payer: Self-pay | Admitting: Radiology

## 2020-09-13 NOTE — Progress Notes (Unsigned)
Dobutamine stress echocardiogram approved by Dr. Margaretann Loveless 08/30/20.

## 2020-09-18 ENCOUNTER — Encounter (HOSPITAL_COMMUNITY): Payer: Self-pay

## 2020-09-18 ENCOUNTER — Ambulatory Visit (AMBULATORY_SURGERY_CENTER): Payer: Self-pay

## 2020-09-18 ENCOUNTER — Other Ambulatory Visit: Payer: Self-pay

## 2020-09-18 ENCOUNTER — Emergency Department (HOSPITAL_COMMUNITY)
Admission: EM | Admit: 2020-09-18 | Discharge: 2020-09-18 | Disposition: A | Discharge status: Home / Self Care | Payer: Medicare Other | Attending: Emergency Medicine | Admitting: Emergency Medicine

## 2020-09-18 VITALS — Ht 72.0 in | Wt 250.0 lb

## 2020-09-18 DIAGNOSIS — Z1211 Encounter for screening for malignant neoplasm of colon: Secondary | ICD-10-CM

## 2020-09-18 DIAGNOSIS — Z955 Presence of coronary angioplasty implant and graft: Secondary | ICD-10-CM | POA: Insufficient documentation

## 2020-09-18 DIAGNOSIS — Z7982 Long term (current) use of aspirin: Secondary | ICD-10-CM | POA: Diagnosis not present

## 2020-09-18 DIAGNOSIS — N186 End stage renal disease: Secondary | ICD-10-CM | POA: Diagnosis not present

## 2020-09-18 DIAGNOSIS — R5383 Other fatigue: Secondary | ICD-10-CM | POA: Diagnosis not present

## 2020-09-18 DIAGNOSIS — R531 Weakness: Secondary | ICD-10-CM | POA: Diagnosis present

## 2020-09-18 DIAGNOSIS — Z794 Long term (current) use of insulin: Secondary | ICD-10-CM | POA: Diagnosis not present

## 2020-09-18 DIAGNOSIS — E1122 Type 2 diabetes mellitus with diabetic chronic kidney disease: Secondary | ICD-10-CM | POA: Diagnosis not present

## 2020-09-18 DIAGNOSIS — Z96642 Presence of left artificial hip joint: Secondary | ICD-10-CM | POA: Diagnosis not present

## 2020-09-18 DIAGNOSIS — Z992 Dependence on renal dialysis: Secondary | ICD-10-CM | POA: Insufficient documentation

## 2020-09-18 DIAGNOSIS — I2510 Atherosclerotic heart disease of native coronary artery without angina pectoris: Secondary | ICD-10-CM | POA: Insufficient documentation

## 2020-09-18 DIAGNOSIS — I132 Hypertensive heart and chronic kidney disease with heart failure and with stage 5 chronic kidney disease, or end stage renal disease: Secondary | ICD-10-CM | POA: Insufficient documentation

## 2020-09-18 DIAGNOSIS — Z87891 Personal history of nicotine dependence: Secondary | ICD-10-CM | POA: Insufficient documentation

## 2020-09-18 DIAGNOSIS — I5042 Chronic combined systolic (congestive) and diastolic (congestive) heart failure: Secondary | ICD-10-CM | POA: Diagnosis not present

## 2020-09-18 DIAGNOSIS — Z20822 Contact with and (suspected) exposure to covid-19: Secondary | ICD-10-CM | POA: Insufficient documentation

## 2020-09-18 LAB — BASIC METABOLIC PANEL
Anion gap: 15 (ref 5–15)
BUN: 79 mg/dL — ABNORMAL HIGH (ref 6–20)
CO2: 24 mmol/L (ref 22–32)
Calcium: 9.2 mg/dL (ref 8.9–10.3)
Chloride: 96 mmol/L — ABNORMAL LOW (ref 98–111)
Creatinine, Ser: 8.26 mg/dL — ABNORMAL HIGH (ref 0.61–1.24)
GFR, Estimated: 7 mL/min — ABNORMAL LOW (ref >60)
Glucose, Bld: 257 mg/dL — ABNORMAL HIGH (ref 70–99)
Potassium: 4.2 mmol/L (ref 3.5–5.1)
Sodium: 135 mmol/L (ref 135–145)

## 2020-09-18 LAB — CBC
HCT: 31.3 % — ABNORMAL LOW (ref 39.0–52.0)
Hemoglobin: 10 g/dL — ABNORMAL LOW (ref 13.0–17.0)
MCH: 28.4 pg (ref 26.0–34.0)
MCHC: 31.9 g/dL (ref 30.0–36.0)
MCV: 88.9 fL (ref 80.0–100.0)
Platelets: 202 10*3/uL (ref 150–400)
RBC: 3.52 MIL/uL — ABNORMAL LOW (ref 4.22–5.81)
RDW: 14.5 % (ref 11.5–15.5)
WBC: 6.9 10*3/uL (ref 4.0–10.5)
nRBC: 0 % (ref 0.0–0.2)

## 2020-09-18 LAB — CBG MONITORING, ED: Glucose-Capillary: 250 mg/dL — ABNORMAL HIGH (ref 70–99)

## 2020-09-18 LAB — POC SARS CORONAVIRUS 2 AG -  ED: SARS Coronavirus 2 Ag: NEGATIVE

## 2020-09-18 MED ORDER — NA SULFATE-K SULFATE-MG SULF 17.5-3.13-1.6 GM/177ML PO SOLN: 1.0000 | Freq: Once | ORAL | 0 refills | Status: AC

## 2020-09-18 NOTE — Discharge Instructions (Signed)
Call your primary care doctor or specialist as discussed in the next 2-3 days.   Return immediately back to the ER if:  Your symptoms worsen within the next 12-24 hours. You develop new symptoms such as new fevers, persistent vomiting, new pain, shortness of breath, or new weakness or numbness, or if you have any other concerns.  

## 2020-09-18 NOTE — Progress Notes (Signed)
No egg or soy allergy known to patient  No issues with past sedation with any surgeries or procedures---"I am slow to go to sleep" No intubation problems in the past  No FH of Malignant Hyperthermia No diet pills per patient No home 02 use per patient  No blood thinners per patient  Pt denies issues with constipation  No A fib or A flutter  EMMI video via MyChart  COVID 19 guidelines implemented in PV today with Pt and RN  Pt is fully vaccinated for Covid x 2 + booster= Coupon given to pt in PV today , Code to Pharmacy and  NO PA's for preps discussed with pt in PV today  Due to the COVID-19 pandemic we are asking patients to follow certain guidelines.  Pt aware of COVID protocols and [facility] guidelines

## 2020-09-18 NOTE — ED Provider Notes (Signed)
[facility] DEPT Provider Note   CSN: [id number] Arrival date & time: 09/18/20  1147     History Chief Complaint  Patient presents with  . Fatigue    Edward [name] is a 50 y.o. male.  Patient presents with complaint of feeling generalized weakness generalized fatigue.  Symptoms started this morning and persisted throughout the day.  Denies any specific pain.        Past Medical History:  Diagnosis Date  . Anemia    getting Venofer with dialysis  . Anxiety    self reported, sometimes-situational anxiety  . Arthritis    bilateral hands/LEFT hip  . CHF (congestive heart failure) ([place])   . Chronic kidney disease    t th s dialysis- [place] ---ESRD  . Complication of anesthesia    slow to go to sleep  . Coronary artery disease   . Depression    self reported, sometimes-situational depression  . Diabetes mellitus    type II- on meds  . Dyspnea    sometimes- fluid  . GERD (gastroesophageal reflux disease)    on meds  . Hypertension    no longer on medication since starting on Hemodialysis  . Myocardial infarction [facility]) 2019    Patient Active Problem List   Diagnosis Date Noted  . ESRD on hemodialysis ([place]) 06/22/2020  . S/P coronary artery stent placement 06/22/2020  . Encounter for removal of sutures 03/07/2020  . Fluid overload, unspecified 12/29/2019  . Hypertensive chronic kidney disease with stage 1 through stage 4 chronic kidney disease, or unspecified chronic kidney disease 11/30/2019  . Pruritus, unspecified 11/30/2019  . Secondary hyperparathyroidism of renal origin ([place]) 11/30/2019  . Unspecified atrial fibrillation ([place]) 11/30/2019  . Pain, unspecified 11/30/2019  . Other specified arthritis, unspecified site 11/30/2019  . Iron deficiency anemia, unspecified 11/30/2019  . End stage renal disease ([place]) 11/30/2019  . Encounter for screening for respiratory tuberculosis 11/30/2019  . Diarrhea, unspecified 11/30/2019  .  Coagulation defect, unspecified ([place]) 11/30/2019  . Atherosclerotic heart disease of native coronary artery without angina pectoris 11/30/2019  . Acute renal failure (ARF) ([place]) 11/22/2019  . Elevated troponin 12/22/2018  . Acute exacerbation of CHF (congestive heart failure) ([place]) 12/21/2018  . Gait abnormality 09/11/2018  . CAD S/P percutaneous coronary angioplasty 08/06/2018  . Cardiomyopathy ([place]) 08/06/2018  . PVD (peripheral vascular disease) ([place]) 08/06/2018  . Shortness of breath   . Chronic combined systolic and diastolic CHF (congestive heart failure) ([place])   . History of NSTEMI 07/28/2018  . Respiratory failure with hypoxia ([place]) 07/28/2018  . Acute heart failure ([place]) 07/28/2018  . Hypertensive emergency 07/28/2018  . S/P BKA (below knee amputation) unilateral, right ([place]) 07/15/2018  . Hypoglycemia   . Anxiety about health   . Labile blood glucose   . Essential hypertension   . History of transmetatarsal amputation of left foot ([place])   . Hypoalbuminemia due to protein-calorie malnutrition ([place])   . Poorly controlled type 2 diabetes mellitus with peripheral neuropathy ([place])   . Acute blood loss anemia   . Anemia of chronic disease   . Leukocytosis   . Tobacco abuse   . Hyperkalemia 06/20/2018  . Diabetic polyneuropathy associated with type 2 diabetes mellitus ([place])   . Severe protein-calorie malnutrition ([place])   . AKI (acute kidney injury) ([place]) 06/17/2018  . Normocytic anemia 06/17/2018  . Viral pharyngitis 01/14/2017  . Cigarette smoker 01/14/2017  . Avascular necrosis of hip, left ([place]) 10/11/2016  . Status post  left hip replacement 10/11/2016  . S/P transmetatarsal amputation of foot, left ([place]) 07/25/2016  . Balance problem 07/25/2016  . Fournier's gangrene 08/16/2011  . Acute respiratory failure ([place]) 08/16/2011  . Septic shock(785.52) 08/16/2011  . Diabetes mellitus with complication, with long-term current use of insulin ([place]) 08/16/2011    Past  Surgical History:  Procedure Laterality Date  . A/V FISTULAGRAM Left 03/06/2020   Procedure: A/V FISTULAGRAM;  Surgeon: Waynetta Sandy, MD;  Location: [place] [facility];  Service: Cardiovascular;  Laterality: Left;  . AMPUTATION Left 05/10/2016   Procedure: Left Transmetatarsal Amputation;  Surgeon: [name], MD;  Location: [place];  Service: Orthopedics;  Laterality: Left;  . AMPUTATION Right 05/16/2018   Procedure: PARTIAL RAY AMPUTATION FIFTH TOE RIGHT FOOT;  Surgeon: [name], DPM;  Location: [place];  Service: Podiatry;  Laterality: Right;  . AMPUTATION Right 06/17/2018   Procedure: AMPUTATION 4th RAY Right Foot;  Surgeon: Trula Slade, DPM;  Location: [place];  Service: Podiatry;  Laterality: Right;  . AMPUTATION Right 06/19/2018   Procedure: RIGHT BELOW KNEE AMPUTATION;  Surgeon: [name], MD;  Location: [place];  Service: Orthopedics;  Laterality: Right;  . APPLICATION OF WOUND VAC Right 06/19/2018   Procedure: APPLICATION OF WOUND VAC;  Surgeon: [name], MD;  Location: [place];  Service: Orthopedics;  Laterality: Right;  . AV FISTULA PLACEMENT Left 11/25/2019   Procedure: LEFT ARM Brachiocephalic  ARTERIOVENOUS (AV) FISTULA CREATION;  Surgeon: Rosetta Posner, MD;  Location: [place];  Service: Vascular;  Laterality: Left;  . BASCILIC VEIN TRANSPOSITION Left 07/19/2020   Procedure: LEFT SECOND STAGE BASCILIC VEIN TRANSPOSITION;  Surgeon: Waynetta Sandy, MD;  Location: [place];  Service: Vascular;  Laterality: Left;  . CARDIAC CATHETERIZATION  07/29/2018  . CIRCUMCISION  09/02/2011   Procedure: CIRCUMCISION ADULT;  Surgeon: Molli Hazard, MD;  Location: [place];  Service: Urology;  Laterality: N/A;  . CORONARY STENT INTERVENTION N/A 07/29/2018   Procedure: CORONARY STENT INTERVENTION;  Surgeon: Leonie Man, MD;  Location: [place] [facility];  Service: Cardiovascular;  Laterality: N/A;  . I & D EXTREMITY  09/02/2011   Procedure:  IRRIGATION AND DEBRIDEMENT EXTREMITY;  Surgeon: Theodoro Kos, DO;  Location: [place];  Service: Plastics;  Laterality: N/A;  . INCISION AND DRAINAGE OF WOUND  08/12/2011   Procedure: IRRIGATION AND DEBRIDEMENT WOUND;  Surgeon: Zenovia Jarred, MD;  Location: [place];  Service: General;;  perineum  . INSERTION OF DIALYSIS CATHETER N/A 11/25/2019   Procedure: INSERTION OF Righ Internal Jugular DIALYSIS CATHETER;  Surgeon: Rosetta Posner, MD;  Location: [place];  Service: Vascular;  Laterality: N/A;  . IRRIGATION AND DEBRIDEMENT ABSCESS  08/12/2011   Procedure: IRRIGATION AND DEBRIDEMENT ABSCESS;  Surgeon: Molli Hazard, MD;  Location: [place];  Service: Urology;  Laterality: N/A;  irrigation and debridment perineum    . IRRIGATION AND DEBRIDEMENT ABSCESS  08/14/2011   Procedure: MINOR INCISION AND DRAINAGE OF ABSCESS;  Surgeon: Molli Hazard, MD;  Location: [place];  Service: Urology;  Laterality: N/A;  Perineal Wound Debridement;Placement Bilateral Testicular Thigh Pouches  . LEFT HEART CATH AND CORONARY ANGIOGRAPHY N/A 07/29/2018   Procedure: LEFT HEART CATH AND CORONARY ANGIOGRAPHY;  Surgeon: Leonie Man, MD;  Location: [place] [facility];  Service: Cardiovascular;  Laterality: N/A;  . REVISON OF ARTERIOVENOUS FISTULA Left 05/24/2020   Procedure: LEFT ARM ARTERIOVENOUS FISTULA  CONVERSION TO BASILIC  VEIN FISTULA;  Surgeon: Waynetta Sandy, MD;  Location: [place];  Service: Vascular;  Laterality: Left;  . TOTAL HIP ARTHROPLASTY Left 10/11/2016   Procedure: LEFT TOTAL HIP ARTHROPLASTY ANTERIOR APPROACH;  Surgeon: Mcarthur Rossetti, MD;  Location: [facility];  Service: Orthopedics;  Laterality: Left;       Family History  Problem Relation Age of Onset  . Diabetes Other   . Pancreatic cancer Mother 23  . Colon cancer Neg Hx   . Colon polyps Neg Hx   . Esophageal cancer Neg Hx   . Rectal cancer Neg Hx   . Stomach cancer Neg Hx     Social History    Tobacco Use  . Smoking status: Former Smoker    Packs/day: 0.25    Years: 24.00    Pack years: 6.00    Types: Cigarettes    Quit date: 11/19/2019    Years since quitting: 0.8  . Smokeless tobacco: Never Used  Vaping Use  . Vaping Use: Never used  Substance Use Topics  . Alcohol use: Not Currently    Alcohol/week: 0.0 standard drinks    Comment: quit 2018  . Drug use: No    Home Medications Prior to Admission medications   Medication Sig Start Date End Date Taking? Authorizing Provider  aspirin EC 81 MG tablet Take 1 tablet (81 mg total) by mouth daily. Swallow whole. 03/14/20   Lelon Perla, MD  bismuth subsalicylate (PEPTO BISMOL) 262 MG/15ML suspension Take 30 mLs by mouth every 6 (six) hours as needed for indigestion.    [provider]  blood glucose meter kit and supplies Dispense based on patient and insurance preference. Use up to four times daily as directed. (FOR ICD-9 250.00, 250.01). 07/01/18   Angiulli, Lavon Paganini, PA-C  cholecalciferol (VITAMIN D3) 25 MCG (1000 UNIT) tablet Take 1,000 Units by mouth Every Tuesday,Thursday,and Saturday with dialysis.     [provider]  doxercalciferol (HECTOROL) 4 MCG/2ML injection Doxercalciferol (Hectorol) 07/22/20 07/21/21  [provider]  Insulin Pen Needle 31G X 5 MM MISC Use daily to inject insulin as instructed 07/01/18   Angiulli, Lavon Paganini, PA-C  iron sucrose (VENOFER) 20 MG/ML injection Iron Sucrose (Venofer) 09/14/20 10/05/20  [provider]  LANTUS SOLOSTAR 100 UNIT/ML Solostar Pen Inject 25 Units into the skin in the morning, at noon, and at bedtime. 06/08/19   [provider]  Methoxy PEG-Epoetin Beta (MIRCERA IJ) Mircera 08/31/20 08/30/21  [provider]  Na Sulfate-K Sulfate-Mg Sulf 17.5-3.13-1.6 GM/177ML SOLN Take 1 kit by mouth once for 1 dose. Apply Coupon=BIN: [id number] PCN: CN GROUP: [id number] ID: [id number]; NO prior authorization 09/18/20 09/18/20  Doran Stabler,  MD  oxyCODONE-acetaminophen (PERCOCET) 5-325 MG tablet Take 1 tablet by mouth every 4 (four) hours as needed for severe pain. Patient not taking: Reported on 09/18/2020 07/19/20 07/19/21  Karoline Caldwell, PA-C  pantoprazole (PROTONIX) 40 MG tablet Take 40 mg by mouth daily. 08/16/20   [provider]  prednisoLONE acetate (PRED FORTE) 1 % ophthalmic suspension Place 1 drop into both eyes 3 (three) times daily. 08/16/20   [provider]  VELPHORO 500 MG chewable tablet Chew 500 mg by mouth 3 (three) times daily with meals.  06/14/20   [provider]    Allergies    Crestor [rosuvastatin]  Review of Systems   Review of Systems  Constitutional: Negative for fever.  HENT: Negative for ear pain and sore throat.   Eyes: Negative for  pain.  Respiratory: Negative for cough.   Cardiovascular: Negative for chest pain.  Gastrointestinal: Negative for abdominal pain.  Genitourinary: Negative for flank pain.  Musculoskeletal: Negative for back pain.  Skin: Negative for color change and rash.  Neurological: Negative for syncope.  All other systems reviewed and are negative.   Physical Exam Updated Vital Signs BP (!) 151/93   Pulse 73   Temp 99.3 F (37.4 C) (Oral)   Resp 17   SpO2 96%   Physical Exam Constitutional:      General: He is not in acute distress.    Appearance: He is well-developed.  HENT:     Head: Normocephalic.     Nose: Nose normal.  Eyes:     Extraocular Movements: Extraocular movements intact.  Cardiovascular:     Rate and Rhythm: Normal rate.  Pulmonary:     Effort: Pulmonary effort is normal.  Skin:    Coloration: Skin is not jaundiced.  Neurological:     General: No focal deficit present.     Mental Status: He is alert and oriented to person, place, and time. Mental status is at baseline.     ED Results / Procedures / Treatments   Labs (all labs ordered are listed, but only abnormal results are displayed) Labs Reviewed   BASIC METABOLIC PANEL - Abnormal; Notable for the following components:      Result Value   Chloride 96 (*)    Glucose, Bld 257 (*)    BUN 79 (*)    Creatinine, Ser 8.26 (*)    GFR, Estimated 7 (*)    All other components within normal limits  CBC - Abnormal; Notable for the following components:   RBC 3.52 (*)    Hemoglobin 10.0 (*)    HCT 31.3 (*)    All other components within normal limits  CBG MONITORING, ED - Abnormal; Notable for the following components:   Glucose-Capillary 250 (*)    All other components within normal limits  URINALYSIS, ROUTINE W REFLEX MICROSCOPIC  CBG MONITORING, ED  POC SARS CORONAVIRUS 2 AG -  ED    EKG EKG Interpretation  Date/Time:  Monday September 18 2020 21:27:31 EST Ventricular Rate:  72 PR Interval:    QRS Duration: 104 QT Interval:  421 QTC Calculation: 461 R Axis:   16 Text Interpretation: Sinus rhythm Prolonged PR interval Confirmed by Thamas Jaegers ([id number]) on 09/18/2020 9:33:14 PM   Radiology No results found.  Procedures Procedures   Medications Ordered in ED Medications - No data to display  ED Course  I have reviewed the triage vital signs and the nursing notes.  Pertinent labs & imaging results that were available during my care of the patient were reviewed by me and considered in my medical decision making (see chart for details).    MDM Rules/Calculators/A&P                          Patient's clinical exam is benign.  He has no complaint of pain or discomfort.  He is concerned that he may not have been dialyzing off, however vital signs within normal limits O2 saturation within normal limits and potassium levels are normal.  Labs otherwise consistent with his history of end-stage kidney disease.  Recommended patient follow closely with his nephrologist within the next 2 or 3 days, keep his regular dialysis appointment, advised me to return if he has fevers worsening symptoms or any additional concerns.  Final  Clinical Impression(s) / ED Diagnoses Final diagnoses:  Other fatigue    Rx / DC Orders ED Discharge Orders    None       Luna Fuse, MD 09/18/20 2155

## 2020-09-18 NOTE — ED Triage Notes (Addendum)
Pt arrived via walk in, c/o generalized fatigue this morning, was not able to get up and move around as normal. States he felt lightheaded this morning while driving in car. States he thought it was from his BP being low. Denies any CP, SOB or any other sx.  States he is a dialysis pt, last dialysis sat, has not missed any sessions.

## 2020-09-19 ENCOUNTER — Ambulatory Visit (HOSPITAL_COMMUNITY): Payer: Medicare Other

## 2020-09-19 ENCOUNTER — Ambulatory Visit (HOSPITAL_COMMUNITY): Payer: Medicare Other | Attending: Internal Medicine

## 2020-09-19 DIAGNOSIS — R0602 Shortness of breath: Secondary | ICD-10-CM | POA: Diagnosis present

## 2020-09-19 DIAGNOSIS — Z01810 Encounter for preprocedural cardiovascular examination: Secondary | ICD-10-CM | POA: Insufficient documentation

## 2020-09-19 MED ORDER — PERFLUTREN LIPID MICROSPHERE
1.0000 mL | INTRAVENOUS | Status: AC | PRN
  Administered 2020-09-19 (×3): 1 mL via INTRAVENOUS

## 2020-09-19 MED ORDER — DOBUTAMINE INFUSION FOR EP/ECHO/NUC (1000 MCG/ML)
40.0000 ug/kg/min | Freq: Once | INTRAVENOUS | Status: AC
  Administered 2020-09-19: 40 ug/kg/min via INTRAVENOUS

## 2020-09-19 MED ORDER — ATROPINE SULFATE 1 MG/10ML IJ SOSY
0.5000 mg | PREFILLED_SYRINGE | Freq: Once | INTRAMUSCULAR | Status: AC
  Administered 2020-09-19: 0.5 mg via INTRAVENOUS

## 2020-09-20 ENCOUNTER — Telehealth: Payer: Self-pay | Admitting: Cardiology

## 2020-09-20 NOTE — Telephone Encounter (Signed)
Patient c/o Palpitations:  High priority if patient c/o lightheadedness, shortness of breath, or chest pain  1) How long have you had palpitations/irregular HR/ Afib? Are you having the symptoms now? Palpitations and yes. Feels like his heart is racing.  2) Are you currently experiencing lightheadedness, SOB or CP? no  3) Do you have a history of afib (atrial fibrillation) or irregular heart rhythm? yes  4) Have you checked your BP or HR? (document readings if available): HR 75 BP 140/90   5) Are you experiencing any other symptoms? Feels nauseous like he is about to throw up.    Patient had a stress test yesterday and wants to know if this is normal afterwards.

## 2020-09-20 NOTE — Telephone Encounter (Signed)
Agree with plan for continued monitoring Edward [name]

## 2020-09-20 NOTE — Telephone Encounter (Signed)
Spoke with patient. He was laying on the couch watching TV when he became hot, felt his heart racing and got nauseous. Checked his HR and BP. 140/90, 75. He continued to lay there for awhile. States he felt like he was having the stress test again. Temperature was normal per patient. Patient advised to avoid caffeine and to call back if symptoms reoccur.

## 2020-09-24 NOTE — Progress Notes (Deleted)
Virtual visit cancelled as patient is currently admitted.

## 2020-09-26 ENCOUNTER — Emergency Department (HOSPITAL_COMMUNITY): Payer: Medicare Other

## 2020-09-26 ENCOUNTER — Inpatient Hospital Stay (HOSPITAL_COMMUNITY)
Admission: EM | Admit: 2020-09-26 | Discharge: 2020-09-29 | DRG: 069 | Disposition: A | Discharge status: Home / Self Care | Payer: Medicare Other | Attending: Internal Medicine | Admitting: Internal Medicine

## 2020-09-26 ENCOUNTER — Other Ambulatory Visit: Payer: Self-pay

## 2020-09-26 DIAGNOSIS — Z20822 Contact with and (suspected) exposure to covid-19: Secondary | ICD-10-CM | POA: Diagnosis present

## 2020-09-26 DIAGNOSIS — D638 Anemia in other chronic diseases classified elsewhere: Secondary | ICD-10-CM | POA: Diagnosis present

## 2020-09-26 DIAGNOSIS — N2581 Secondary hyperparathyroidism of renal origin: Secondary | ICD-10-CM | POA: Diagnosis present

## 2020-09-26 DIAGNOSIS — Z8673 Personal history of transient ischemic attack (TIA), and cerebral infarction without residual deficits: Secondary | ICD-10-CM

## 2020-09-26 DIAGNOSIS — Z7982 Long term (current) use of aspirin: Secondary | ICD-10-CM

## 2020-09-26 DIAGNOSIS — E8889 Other specified metabolic disorders: Secondary | ICD-10-CM | POA: Diagnosis present

## 2020-09-26 DIAGNOSIS — R200 Anesthesia of skin: Secondary | ICD-10-CM | POA: Diagnosis present

## 2020-09-26 DIAGNOSIS — Z96642 Presence of left artificial hip joint: Secondary | ICD-10-CM | POA: Diagnosis present

## 2020-09-26 DIAGNOSIS — E1165 Type 2 diabetes mellitus with hyperglycemia: Secondary | ICD-10-CM | POA: Diagnosis present

## 2020-09-26 DIAGNOSIS — E1142 Type 2 diabetes mellitus with diabetic polyneuropathy: Secondary | ICD-10-CM | POA: Diagnosis present

## 2020-09-26 DIAGNOSIS — Z87891 Personal history of nicotine dependence: Secondary | ICD-10-CM

## 2020-09-26 DIAGNOSIS — E860 Dehydration: Secondary | ICD-10-CM | POA: Diagnosis present

## 2020-09-26 DIAGNOSIS — Z794 Long term (current) use of insulin: Secondary | ICD-10-CM

## 2020-09-26 DIAGNOSIS — E785 Hyperlipidemia, unspecified: Secondary | ICD-10-CM | POA: Diagnosis present

## 2020-09-26 DIAGNOSIS — K7200 Acute and subacute hepatic failure without coma: Secondary | ICD-10-CM | POA: Diagnosis present

## 2020-09-26 DIAGNOSIS — E669 Obesity, unspecified: Secondary | ICD-10-CM | POA: Diagnosis present

## 2020-09-26 DIAGNOSIS — K7460 Unspecified cirrhosis of liver: Secondary | ICD-10-CM | POA: Diagnosis present

## 2020-09-26 DIAGNOSIS — R7401 Elevation of levels of liver transaminase levels: Secondary | ICD-10-CM

## 2020-09-26 DIAGNOSIS — N186 End stage renal disease: Secondary | ICD-10-CM | POA: Diagnosis present

## 2020-09-26 DIAGNOSIS — I132 Hypertensive heart and chronic kidney disease with heart failure and with stage 5 chronic kidney disease, or end stage renal disease: Secondary | ICD-10-CM | POA: Diagnosis present

## 2020-09-26 DIAGNOSIS — G459 Transient cerebral ischemic attack, unspecified: Principal | ICD-10-CM | POA: Diagnosis present

## 2020-09-26 DIAGNOSIS — F419 Anxiety disorder, unspecified: Secondary | ICD-10-CM | POA: Diagnosis present

## 2020-09-26 DIAGNOSIS — Z992 Dependence on renal dialysis: Secondary | ICD-10-CM

## 2020-09-26 DIAGNOSIS — I2510 Atherosclerotic heart disease of native coronary artery without angina pectoris: Secondary | ICD-10-CM | POA: Diagnosis present

## 2020-09-26 DIAGNOSIS — Z6834 Body mass index (BMI) 34.0-34.9, adult: Secondary | ICD-10-CM

## 2020-09-26 DIAGNOSIS — K810 Acute cholecystitis: Secondary | ICD-10-CM

## 2020-09-26 DIAGNOSIS — R1011 Right upper quadrant pain: Secondary | ICD-10-CM

## 2020-09-26 DIAGNOSIS — Z833 Family history of diabetes mellitus: Secondary | ICD-10-CM

## 2020-09-26 DIAGNOSIS — R1110 Vomiting, unspecified: Secondary | ICD-10-CM | POA: Diagnosis present

## 2020-09-26 DIAGNOSIS — Z888 Allergy status to other drugs, medicaments and biological substances status: Secondary | ICD-10-CM

## 2020-09-26 DIAGNOSIS — R7989 Other specified abnormal findings of blood chemistry: Secondary | ICD-10-CM

## 2020-09-26 DIAGNOSIS — I5032 Chronic diastolic (congestive) heart failure: Secondary | ICD-10-CM | POA: Diagnosis present

## 2020-09-26 DIAGNOSIS — I10 Essential (primary) hypertension: Secondary | ICD-10-CM | POA: Diagnosis present

## 2020-09-26 DIAGNOSIS — I252 Old myocardial infarction: Secondary | ICD-10-CM

## 2020-09-26 DIAGNOSIS — D631 Anemia in chronic kidney disease: Secondary | ICD-10-CM | POA: Diagnosis present

## 2020-09-26 DIAGNOSIS — Z89432 Acquired absence of left foot: Secondary | ICD-10-CM

## 2020-09-26 DIAGNOSIS — R531 Weakness: Secondary | ICD-10-CM

## 2020-09-26 DIAGNOSIS — Z955 Presence of coronary angioplasty implant and graft: Secondary | ICD-10-CM

## 2020-09-26 DIAGNOSIS — E1169 Type 2 diabetes mellitus with other specified complication: Secondary | ICD-10-CM | POA: Diagnosis present

## 2020-09-26 DIAGNOSIS — E1122 Type 2 diabetes mellitus with diabetic chronic kidney disease: Secondary | ICD-10-CM | POA: Diagnosis present

## 2020-09-26 DIAGNOSIS — Z89511 Acquired absence of right leg below knee: Secondary | ICD-10-CM

## 2020-09-26 DIAGNOSIS — R319 Hematuria, unspecified: Secondary | ICD-10-CM | POA: Diagnosis present

## 2020-09-26 DIAGNOSIS — K219 Gastro-esophageal reflux disease without esophagitis: Secondary | ICD-10-CM

## 2020-09-26 DIAGNOSIS — Z800 Family history of malignant neoplasm of digestive organs: Secondary | ICD-10-CM

## 2020-09-26 DIAGNOSIS — R791 Abnormal coagulation profile: Secondary | ICD-10-CM | POA: Diagnosis present

## 2020-09-26 LAB — DIFFERENTIAL
Abs Immature Granulocytes: 0.08 10*3/uL — ABNORMAL HIGH (ref 0.00–0.07)
Basophils Absolute: 0.1 10*3/uL (ref 0.0–0.1)
Basophils Relative: 1 %
Eosinophils Absolute: 0.2 10*3/uL (ref 0.0–0.5)
Eosinophils Relative: 2 %
Immature Granulocytes: 1 %
Lymphocytes Relative: 9 %
Lymphs Abs: 0.9 10*3/uL (ref 0.7–4.0)
Monocytes Absolute: 0.6 10*3/uL (ref 0.1–1.0)
Monocytes Relative: 6 %
Neutro Abs: 8.3 10*3/uL — ABNORMAL HIGH (ref 1.7–7.7)
Neutrophils Relative %: 81 %

## 2020-09-26 LAB — I-STAT CHEM 8, ED
BUN: 45 mg/dL — ABNORMAL HIGH (ref 6–20)
Calcium, Ion: 0.99 mmol/L — ABNORMAL LOW (ref 1.15–1.40)
Chloride: 94 mmol/L — ABNORMAL LOW (ref 98–111)
Creatinine, Ser: 7.3 mg/dL — ABNORMAL HIGH (ref 0.61–1.24)
Glucose, Bld: 321 mg/dL — ABNORMAL HIGH (ref 70–99)
HCT: 30 % — ABNORMAL LOW (ref 39.0–52.0)
Hemoglobin: 10.2 g/dL — ABNORMAL LOW (ref 13.0–17.0)
Potassium: 4.6 mmol/L (ref 3.5–5.1)
Sodium: 135 mmol/L (ref 135–145)
TCO2: 31 mmol/L (ref 22–32)

## 2020-09-26 LAB — RESP PANEL BY RT-PCR (FLU A&B, COVID) ARPGX2
Influenza A by PCR: NEGATIVE
Influenza B by PCR: NEGATIVE
SARS Coronavirus 2 by RT PCR: NEGATIVE

## 2020-09-26 LAB — URINALYSIS, ROUTINE W REFLEX MICROSCOPIC
Bilirubin Urine: NEGATIVE
Glucose, UA: 50 mg/dL — AB
Ketones, ur: NEGATIVE mg/dL
Leukocytes,Ua: NEGATIVE
Nitrite: NEGATIVE
Protein, ur: >=300 mg/dL — AB
Specific Gravity, Urine: 1.013 (ref 1.005–1.030)
pH: 9 — ABNORMAL HIGH (ref 5.0–8.0)

## 2020-09-26 LAB — APTT: aPTT: 33 seconds (ref 24–36)

## 2020-09-26 LAB — COMPREHENSIVE METABOLIC PANEL
ALT: 1154 U/L — ABNORMAL HIGH (ref 0–44)
AST: 807 U/L — ABNORMAL HIGH (ref 15–41)
Albumin: 3.3 g/dL — ABNORMAL LOW (ref 3.5–5.0)
Alkaline Phosphatase: 106 U/L (ref 38–126)
Anion gap: 14 (ref 5–15)
BUN: 45 mg/dL — ABNORMAL HIGH (ref 6–20)
CO2: 28 mmol/L (ref 22–32)
Calcium: 8.4 mg/dL — ABNORMAL LOW (ref 8.9–10.3)
Chloride: 93 mmol/L — ABNORMAL LOW (ref 98–111)
Creatinine, Ser: 7 mg/dL — ABNORMAL HIGH (ref 0.61–1.24)
GFR, Estimated: 9 mL/min — ABNORMAL LOW (ref >60)
Glucose, Bld: 306 mg/dL — ABNORMAL HIGH (ref 70–99)
Potassium: 4.6 mmol/L (ref 3.5–5.1)
Sodium: 135 mmol/L (ref 135–145)
Total Bilirubin: 2.2 mg/dL — ABNORMAL HIGH (ref 0.3–1.2)
Total Protein: 6.8 g/dL (ref 6.5–8.1)

## 2020-09-26 LAB — PROTIME-INR
INR: 1.6 — ABNORMAL HIGH (ref 0.8–1.2)
Prothrombin Time: 18.3 seconds — ABNORMAL HIGH (ref 11.4–15.2)

## 2020-09-26 LAB — LIPASE, BLOOD: Lipase: 24 U/L (ref 11–51)

## 2020-09-26 LAB — CBC
HCT: 31.6 % — ABNORMAL LOW (ref 39.0–52.0)
Hemoglobin: 9.9 g/dL — ABNORMAL LOW (ref 13.0–17.0)
MCH: 28 pg (ref 26.0–34.0)
MCHC: 31.3 g/dL (ref 30.0–36.0)
MCV: 89.5 fL (ref 80.0–100.0)
Platelets: 174 10*3/uL (ref 150–400)
RBC: 3.53 MIL/uL — ABNORMAL LOW (ref 4.22–5.81)
RDW: 15.7 % — ABNORMAL HIGH (ref 11.5–15.5)
WBC: 10 10*3/uL (ref 4.0–10.5)
nRBC: 0.2 % (ref 0.0–0.2)

## 2020-09-26 MED ORDER — LORAZEPAM 2 MG/ML IJ SOLN: 1.0000 mg | Freq: Once | INTRAMUSCULAR | Status: AC

## 2020-09-26 MED ORDER — LORAZEPAM 2 MG/ML IJ SOLN
INTRAMUSCULAR | Status: AC
  Administered 2020-09-26: 1 mg via INTRAVENOUS
  Filled 2020-09-26: qty 1

## 2020-09-26 MED ORDER — SODIUM CHLORIDE 0.9% FLUSH
3.0000 mL | Freq: Once | INTRAVENOUS | Status: AC
  Administered 2020-09-26: 3 mL via INTRAVENOUS

## 2020-09-26 NOTE — ED Provider Notes (Signed)
I provided a substantive portion of the care of this patient.  I personally performed the entirety of the history for this encounter.    General surgery consulted. MRI brain pending for left hand weakness and numbness. Anticipated medical admission.  Patient reports all perception of weakness and numbness in his left upper extremity have resolved. He denies he is having any active pain in the abdomen. Patient is alert and appropriate. No confusion. No respiratory distress.  I agree with plan of management.   Charlesetta Shanks, MD 09/28/20 [phone]

## 2020-09-26 NOTE — ED Provider Notes (Signed)
[facility] EMERGENCY DEPARTMENT Provider Note   CSN: [id number] Arrival date & time: 09/26/20  1629     History Chief Complaint  Patient presents with  . Weakness    Edward [name] is a 50 y.o. male.  HPI   50 year old male with a history of anemia, anxiety, CHF, ESRD, CAD, diabetes, GERD, hypertension, MI, who presents to the emergency department today for evaluation of weakness.  Patient is concerned that he had a stroke yesterday.  He states that he was having trouble holding an object yesterday with his left hand.  This lasted for approximately 5 minutes and resolved on its own.  He also had some left upper extremity numbness at this time.  He denies noticing any left lower extremity weakness/numbness.  He denies any facial droop, vision changes or aphasia.  He further notes that yesterday he had multiple episodes of vomiting and was having abdominal bloating as well.  He denies any significant abdominal pain.  He did have some diarrhea.  He denies any fevers.  He reports he had some hematuria yesterday.  He denies any dysuria or frequency.  Past Medical History:  Diagnosis Date  . Anemia    getting Venofer with dialysis  . Anxiety    self reported, sometimes-situational anxiety  . Arthritis    bilateral hands/LEFT hip  . CHF (congestive heart failure) ([place])   . Chronic kidney disease    t th s dialysis- [place] ---ESRD  . Complication of anesthesia    slow to go to sleep  . Coronary artery disease   . Depression    self reported, sometimes-situational depression  . Diabetes mellitus    type II- on meds  . Dyspnea    sometimes- fluid  . GERD (gastroesophageal reflux disease)    on meds  . Hypertension    no longer on medication since starting on Hemodialysis  . Myocardial infarction [facility]) 2019    Patient Active Problem List   Diagnosis Date Noted  . ESRD on hemodialysis ([place]) 06/22/2020  . S/P coronary artery stent placement 06/22/2020  .  Encounter for removal of sutures 03/07/2020  . Fluid overload, unspecified 12/29/2019  . Hypertensive chronic kidney disease with stage 1 through stage 4 chronic kidney disease, or unspecified chronic kidney disease 11/30/2019  . Pruritus, unspecified 11/30/2019  . Secondary hyperparathyroidism of renal origin ([place]) 11/30/2019  . Unspecified atrial fibrillation ([place]) 11/30/2019  . Pain, unspecified 11/30/2019  . Other specified arthritis, unspecified site 11/30/2019  . Iron deficiency anemia, unspecified 11/30/2019  . End stage renal disease ([place]) 11/30/2019  . Encounter for screening for respiratory tuberculosis 11/30/2019  . Diarrhea, unspecified 11/30/2019  . Coagulation defect, unspecified ([place]) 11/30/2019  . Atherosclerotic heart disease of native coronary artery without angina pectoris 11/30/2019  . Acute renal failure (ARF) ([place]) 11/22/2019  . Elevated troponin 12/22/2018  . Acute exacerbation of CHF (congestive heart failure) ([place]) 12/21/2018  . Gait abnormality 09/11/2018  . CAD S/P percutaneous coronary angioplasty 08/06/2018  . Cardiomyopathy ([place]) 08/06/2018  . PVD (peripheral vascular disease) ([place]) 08/06/2018  . Shortness of breath   . Chronic combined systolic and diastolic CHF (congestive heart failure) ([place])   . History of NSTEMI 07/28/2018  . Respiratory failure with hypoxia ([place]) 07/28/2018  . Acute heart failure ([place]) 07/28/2018  . Hypertensive emergency 07/28/2018  . S/P BKA (below knee amputation) unilateral, right ([place]) 07/15/2018  . Hypoglycemia   . Anxiety about health   . Labile blood glucose   .  Essential hypertension   . History of transmetatarsal amputation of left foot ([place])   . Hypoalbuminemia due to protein-calorie malnutrition ([place])   . Poorly controlled type 2 diabetes mellitus with peripheral neuropathy ([place])   . Acute blood loss anemia   . Anemia of chronic disease   . Leukocytosis   . Tobacco abuse   . Hyperkalemia 06/20/2018  . Diabetic  polyneuropathy associated with type 2 diabetes mellitus ([place])   . Severe protein-calorie malnutrition ([place])   . AKI (acute kidney injury) ([place]) 06/17/2018  . Normocytic anemia 06/17/2018  . Viral pharyngitis 01/14/2017  . Cigarette smoker 01/14/2017  . Avascular necrosis of hip, left ([place]) 10/11/2016  . Status post left hip replacement 10/11/2016  . S/P transmetatarsal amputation of foot, left ([place]) 07/25/2016  . Balance problem 07/25/2016  . Fournier's gangrene 08/16/2011  . Acute respiratory failure ([place]) 08/16/2011  . Septic shock(785.52) 08/16/2011  . Diabetes mellitus with complication, with long-term current use of insulin ([place]) 08/16/2011    Past Surgical History:  Procedure Laterality Date  . A/V FISTULAGRAM Left 03/06/2020   Procedure: A/V FISTULAGRAM;  Surgeon: Waynetta Sandy, MD;  Location: [facility];  Service: Cardiovascular;  Laterality: Left;  . AMPUTATION Left 05/10/2016   Procedure: Left Transmetatarsal Amputation;  Surgeon: [name], MD;  Location: [facility];  Service: Orthopedics;  Laterality: Left;  . AMPUTATION Right 05/16/2018   Procedure: PARTIAL RAY AMPUTATION FIFTH TOE RIGHT FOOT;  Surgeon: [name], DPM;  Location: [facility];  Service: Podiatry;  Laterality: Right;  . AMPUTATION Right 06/17/2018   Procedure: AMPUTATION 4th RAY Right Foot;  Surgeon: Trula Slade, DPM;  Location: [facility];  Service: Podiatry;  Laterality: Right;  . AMPUTATION Right 06/19/2018   Procedure: RIGHT BELOW KNEE AMPUTATION;  Surgeon: [name], MD;  Location: [facility];  Service: Orthopedics;  Laterality: Right;  . APPLICATION OF WOUND VAC Right 06/19/2018   Procedure: APPLICATION OF WOUND VAC;  Surgeon: [name], MD;  Location: [facility];  Service: Orthopedics;  Laterality: Right;  . AV FISTULA PLACEMENT Left 11/25/2019   Procedure: LEFT ARM Brachiocephalic  ARTERIOVENOUS (AV) FISTULA CREATION;  Surgeon: Rosetta Posner, MD;  Location: [facility];  Service: Vascular;   Laterality: Left;  . BASCILIC VEIN TRANSPOSITION Left 07/19/2020   Procedure: LEFT SECOND STAGE BASCILIC VEIN TRANSPOSITION;  Surgeon: Waynetta Sandy, MD;  Location: [facility];  Service: Vascular;  Laterality: Left;  . CARDIAC CATHETERIZATION  07/29/2018  . CIRCUMCISION  09/02/2011   Procedure: CIRCUMCISION ADULT;  Surgeon: Molli Hazard, MD;  Location: [facility];  Service: Urology;  Laterality: N/A;  . CORONARY STENT INTERVENTION N/A 07/29/2018   Procedure: CORONARY STENT INTERVENTION;  Surgeon: Leonie Man, MD;  Location: [facility];  Service: Cardiovascular;  Laterality: N/A;  . I & D EXTREMITY  09/02/2011   Procedure: IRRIGATION AND DEBRIDEMENT EXTREMITY;  Surgeon: Theodoro Kos, DO;  Location: [facility];  Service: Plastics;  Laterality: N/A;  . INCISION AND DRAINAGE OF WOUND  08/12/2011   Procedure: IRRIGATION AND DEBRIDEMENT WOUND;  Surgeon: Zenovia Jarred, MD;  Location: [facility];  Service: General;;  perineum  . INSERTION OF DIALYSIS CATHETER N/A 11/25/2019   Procedure: INSERTION OF Righ Internal Jugular DIALYSIS CATHETER;  Surgeon: Rosetta Posner, MD;  Location: [facility];  Service: Vascular;  Laterality: N/A;  . IRRIGATION AND DEBRIDEMENT ABSCESS  08/12/2011   Procedure: IRRIGATION AND DEBRIDEMENT ABSCESS;  Surgeon:  Molli Hazard, MD;  Location: [facility];  Service: Urology;  Laterality: N/A;  irrigation and debridment perineum    . IRRIGATION AND DEBRIDEMENT ABSCESS  08/14/2011   Procedure: MINOR INCISION AND DRAINAGE OF ABSCESS;  Surgeon: Molli Hazard, MD;  Location: [facility];  Service: Urology;  Laterality: N/A;  Perineal Wound Debridement;Placement Bilateral Testicular Thigh Pouches  . LEFT HEART CATH AND CORONARY ANGIOGRAPHY N/A 07/29/2018   Procedure: LEFT HEART CATH AND CORONARY ANGIOGRAPHY;  Surgeon: Leonie Man, MD;  Location: [facility];  Service: Cardiovascular;  Laterality: N/A;  . REVISON OF  ARTERIOVENOUS FISTULA Left 05/24/2020   Procedure: LEFT ARM ARTERIOVENOUS FISTULA  CONVERSION TO BASILIC VEIN FISTULA;  Surgeon: Waynetta Sandy, MD;  Location: [facility];  Service: Vascular;  Laterality: Left;  . TOTAL HIP ARTHROPLASTY Left 10/11/2016   Procedure: LEFT TOTAL HIP ARTHROPLASTY ANTERIOR APPROACH;  Surgeon: Mcarthur Rossetti, MD;  Location: [facility];  Service: Orthopedics;  Laterality: Left;       Family History  Problem Relation Age of Onset  . Diabetes Other   . Pancreatic cancer Mother 47  . Colon cancer Neg Hx   . Colon polyps Neg Hx   . Esophageal cancer Neg Hx   . Rectal cancer Neg Hx   . Stomach cancer Neg Hx     Social History   Tobacco Use  . Smoking status: Former Smoker    Packs/day: 0.25    Years: 24.00    Pack years: 6.00    Types: Cigarettes    Quit date: 11/19/2019    Years since quitting: 0.8  . Smokeless tobacco: Never Used  Vaping Use  . Vaping Use: Never used  Substance Use Topics  . Alcohol use: Not Currently    Alcohol/week: 0.0 standard drinks    Comment: quit 2018  . Drug use: No    Home Medications Prior to Admission medications   Medication Sig Start Date End Date Taking? Authorizing Provider  aspirin EC 81 MG tablet Take 1 tablet (81 mg total) by mouth daily. Swallow whole. 03/14/20   Lelon Perla, MD  bismuth subsalicylate (PEPTO BISMOL) 262 MG/15ML suspension Take 30 mLs by mouth every 6 (six) hours as needed for indigestion.    [provider]  blood glucose meter kit and supplies Dispense based on patient and insurance preference. Use up to four times daily as directed. (FOR ICD-9 250.00, 250.01). 07/01/18   Angiulli, Lavon Paganini, PA-C  cholecalciferol (VITAMIN D3) 25 MCG (1000 UNIT) tablet Take 1,000 Units by mouth Every Tuesday,Thursday,and Saturday with dialysis.     [provider]  doxercalciferol (HECTOROL) 4 MCG/2ML injection Doxercalciferol (Hectorol) 07/22/20 07/21/21  [provider]   Insulin Pen Needle 31G X 5 MM MISC Use daily to inject insulin as instructed 07/01/18   Angiulli, Lavon Paganini, PA-C  iron sucrose (VENOFER) 20 MG/ML injection Iron Sucrose (Venofer) 09/14/20 10/05/20  [provider]  LANTUS SOLOSTAR 100 UNIT/ML Solostar Pen Inject 25 Units into the skin in the morning, at noon, and at bedtime. 06/08/19   [provider]  Methoxy PEG-Epoetin Beta (MIRCERA IJ) Mircera 08/31/20 08/30/21  [provider]  oxyCODONE-acetaminophen (PERCOCET) 5-325 MG tablet Take 1 tablet by mouth every 4 (four) hours as needed for severe pain. Patient not taking: Reported on 09/18/2020 07/19/20 07/19/21  Karoline [facility], PA-C  pantoprazole (PROTONIX) 40 MG tablet Take 40 mg by mouth daily. 08/16/20   [provider]  prednisoLONE acetate (PRED FORTE)  1 % ophthalmic suspension Place 1 drop into both eyes 3 (three) times daily. 08/16/20   [provider]  VELPHORO 500 MG chewable tablet Chew 500 mg by mouth 3 (three) times daily with meals.  06/14/20   [provider]    Allergies    Crestor [rosuvastatin]  Review of Systems   Review of Systems  Constitutional: Negative for chills and fever.  HENT: Negative for ear pain and sore throat.   Eyes: Negative for visual disturbance.  Respiratory: Negative for cough and shortness of breath.   Cardiovascular: Negative for chest pain.  Gastrointestinal: Positive for nausea and vomiting. Negative for abdominal pain.  Genitourinary: Negative for dysuria and hematuria.  Musculoskeletal: Negative for back pain.  Skin: Negative for rash.  Neurological: Positive for weakness, numbness and headaches (resolved). Negative for dizziness, seizures, syncope and light-headedness.  All other systems reviewed and are negative.   Physical Exam Updated Vital Signs BP 113/79 (BP Location: Right Arm)   Pulse 86   Temp 99.3 F (37.4 C) (Rectal)   Resp (!) 21   SpO2 96%   Physical Exam Vitals and  nursing note reviewed.  Constitutional:      Appearance: He is well-developed and well-nourished.  HENT:     Head: Normocephalic and atraumatic.  Eyes:     Conjunctiva/sclera: Conjunctivae normal.  Cardiovascular:     Rate and Rhythm: Normal rate and regular rhythm.     Pulses: Normal pulses.     Heart sounds: Normal heart sounds. No murmur heard.   Pulmonary:     Effort: Pulmonary effort is normal. No respiratory distress.     Breath sounds: Normal breath sounds. No wheezing, rhonchi or rales.  Abdominal:     General: Bowel sounds are normal.     Palpations: Abdomen is soft.     Tenderness: There is abdominal tenderness (mild epigastric ttp). There is no guarding or rebound.  Musculoskeletal:        General: No edema.     Cervical back: Neck supple.  Skin:    General: Skin is warm and dry.  Neurological:     Mental Status: He is alert.     Comments: Mental Status:  Alert, thought content appropriate, able to give a coherent history. Speech fluent without evidence of aphasia. Able to follow 2 step commands without difficulty.  Cranial Nerves:  II:  pupils equal, round, reactive to light III,IV, VI: ptosis not present, extra-ocular motions intact bilaterally  V,VII: smile symmetric, facial light touch sensation equal VIII: hearing grossly normal to voice  X: uvula elevates symmetrically  XI: bilateral shoulder shrug symmetric and strong XII: midline tongue extension without fassiculations Motor:  Normal tone. 5/5 strength of BUE, LLE with 4/5 strength, 5/5 strength to the RLE Sensory: light touch normal in all extremities.   Psychiatric:        Mood and Affect: Mood and affect normal.     ED Results / Procedures / Treatments   Labs (all labs ordered are listed, but only abnormal results are displayed) Labs Reviewed  PROTIME-INR - Abnormal; Notable for the following components:      Result Value   Prothrombin Time 18.3 (*)    INR 1.6 (*)    All other components  within normal limits  CBC - Abnormal; Notable for the following components:   RBC 3.53 (*)    Hemoglobin 9.9 (*)    HCT 31.6 (*)    RDW 15.7 (*)    All other  components within normal limits  DIFFERENTIAL - Abnormal; Notable for the following components:   Neutro Abs 8.3 (*)    Abs Immature Granulocytes 0.08 (*)    All other components within normal limits  COMPREHENSIVE METABOLIC PANEL - Abnormal; Notable for the following components:   Chloride 93 (*)    Glucose, Bld 306 (*)    BUN 45 (*)    Creatinine, Ser 7.00 (*)    Calcium 8.4 (*)    Albumin 3.3 (*)    AST 807 (*)    ALT 1,154 (*)    Total Bilirubin 2.2 (*)    GFR, Estimated 9 (*)    All other components within normal limits  I-STAT CHEM 8, ED - Abnormal; Notable for the following components:   Chloride 94 (*)    BUN 45 (*)    Creatinine, Ser 7.30 (*)    Glucose, Bld 321 (*)    Calcium, Ion 0.99 (*)    Hemoglobin 10.2 (*)    HCT 30.0 (*)    All other components within normal limits  URINE CULTURE  RESP PANEL BY RT-PCR (FLU A&B, COVID) ARPGX2  APTT  LIPASE, BLOOD  URINALYSIS, ROUTINE W REFLEX MICROSCOPIC  HEPATITIS PANEL, ACUTE  CBG MONITORING, ED    EKG None  Radiology CT HEAD WO CONTRAST  Result Date: 09/26/2020 CLINICAL DATA:  Transient neurologic deficit with decreased left hand grip strength lasting approximately 5 minutes EXAM: CT HEAD WITHOUT CONTRAST TECHNIQUE: Contiguous axial images were obtained from the base of the skull through the vertex without intravenous contrast. COMPARISON:  None. FINDINGS: Brain: Hypoattenuating foci in the bilateral insula/external capsules may reflect sequela prior lacunar type infarcts, favor remote though are overall age indeterminate. No other sites of acute CT evident large vascular territory infarct. No evidence of acute hemorrhage, hydrocephalus, extra-axial collection, visible mass lesion or mass effect. Slight prominence of the ventricles, cisterns and sulci compatible  with parenchymal volume loss. Vascular: Atherosclerotic calcification of the carotid siphons. No hyperdense vessel. Skull: No calvarial fracture or suspicious osseous lesion. No scalp swelling or hematoma. Sinuses/Orbits: Paranasal sinuses and mastoid air cells are predominantly clear. Orbital structures are unremarkable aside from prior lens extractions. Other: None IMPRESSION: 1. Hypoattenuating foci in the bilateral insula/external capsules may reflect sequela prior lacunar type infarcts, favor remote though are overall age indeterminate. If there is persisting concern for acute infarction, MRI is more sensitive and specific for early features of ischemia. 2. No other sites of acute CT evident large vascular territory or cortically based infarct. No other acute intracranial abnormality. 3. Intracranial atherosclerosis. Electronically Signed   By: Lovena Le M.D.   On: 09/26/2020 17:52   US Abdomen Limited RUQ (LIVER/GB)  Result Date: 09/26/2020 CLINICAL DATA:  Right upper quadrant pain, discomfort EXAM: ULTRASOUND ABDOMEN LIMITED RIGHT UPPER QUADRANT COMPARISON:  CT 06/09/2020, renal ultrasound 07/12/2019 FINDINGS: Gallbladder: Hyperdense, shadowing biliary material likely reflecting an admixture of tiny stones and biliary sludge. Mild irregular gallbladder wall thickening without pericholecystic fluid. Sonographic Percell Miller sign is reportedly negative. Common bile duct: Diameter: 4.2 mm, nondilated Liver: Slightly lobular hepatic surface contour with mild heterogeneity of the hepatic parenchyma. No intrahepatic biliary ductal dilatation. No concerning focal hepatic lesion. Portal vein is patent on color Doppler imaging with normal direction of blood flow towards the liver. Other: None. IMPRESSION: 1. Hyperdense, shadowing biliary material likely reflecting an admixture of tiny stones and biliary sludge with gallbladder wall thickening, somewhat equivocal for cholecystitis given concomitant findings in liver  and  the absence of a sonographic Percell Miller sign though could reflect an early or developing acute cholecystitis in the appropriate clinical context. If further imaging evaluation is required, consider HIDA. 2. Slightly lobular hepatic surface contour and parenchymal heterogeneity, can be seen in the setting of cirrhosis/intrinsic liver disease. Correlate with patient history and LFTs. Electronically Signed   By: Lovena Le M.D.   On: 09/26/2020 21:02    Procedures Procedures   Medications Ordered in ED Medications  sodium chloride flush (NS) 0.9 % injection 3 mL (3 mLs Intravenous Given 09/26/20 2019)    ED Course  I have reviewed the triage vital signs and the nursing notes.  Pertinent labs & imaging results that were available during my care of the patient were reviewed by me and considered in my medical decision making (see chart for details).    MDM Rules/Calculators/A&P                          50 y/o M presenting for eval of weakness that occurred yesterday. Also c/o NV yesterday which has since resolved  Reviewed/interpreted labs CBC w/o leukocytosis, anemia presenting and appears grossly at baseline CMP with elevated cr consistent with h/o esrd, also with significantly elevated LFTS and mildly elevated bilirubin Lipase wnl inr elevated at 1.6  CT head -  1. Hypoattenuating foci in the bilateral insula/external capsules may reflect sequela prior lacunar type infarcts, favor remote though are overall age indeterminate. If there is persisting concern for acute infarction, MRI is more sensitive and specific for early features of ischemia. 2. No other sites of acute CT evident large vascular territory or cortically based infarct. No other acute intracranial abnormality. 3. Intracranial atherosclerosis.  RUQ [place] - 1. Hyperdense, shadowing biliary material likely reflecting an admixture of tiny stones and biliary sludge with gallbladder wall thickening, somewhat equivocal for cholecystitis  given concomitant findings in liver and the absence of a sonographic Murphy sign though could reflect an early or developing acute cholecystitis in the appropriate clinical context. If further imaging evaluation is required, consider HIDA. 2. Slightly lobular hepatic surface contour and parenchymal heterogeneity, can be seen in the setting of cirrhosis/intrinsic liver disease. MRI brain - pending  9:47 PM CONSULT with Dr. Windle Guard. She recommends a hepatitis panel and to get GI involved. Gen surg will consult on pt in AM.   9:51 PM Secure chat message send to Dr. Loletha Carrow with [facility] regarding consult in AM  At shift change, pt pending MRI. Care transitioned to Dr. Johnney Killian with plan for admission after MRI brain.  Final Clinical Impression(s) / ED Diagnoses Final diagnoses:  RUQ discomfort  Weakness  Transaminitis    Rx / DC Orders ED Discharge Orders    None       Bishop Dublin 09/26/20 2153    Charlesetta Shanks, MD 09/28/20 1731

## 2020-09-26 NOTE — ED Triage Notes (Addendum)
Pt reports having a possible stroke yesterday. States he had an episode yesterday of left hand weakness when gripping an object, it lasted approx 5 mins. Pt then reported also having n/v, syncope and blood in urine yesterday. Dialysis pt, received full treatment today. There are no neuro symptoms noted at triage.

## 2020-09-27 ENCOUNTER — Telehealth: Payer: Medicare Other | Admitting: Student

## 2020-09-27 ENCOUNTER — Inpatient Hospital Stay (HOSPITAL_COMMUNITY): Payer: Medicare Other

## 2020-09-27 ENCOUNTER — Encounter (HOSPITAL_COMMUNITY): Payer: Self-pay | Admitting: Family Medicine

## 2020-09-27 DIAGNOSIS — K819 Cholecystitis, unspecified: Secondary | ICD-10-CM

## 2020-09-27 DIAGNOSIS — E785 Hyperlipidemia, unspecified: Secondary | ICD-10-CM | POA: Diagnosis present

## 2020-09-27 DIAGNOSIS — R112 Nausea with vomiting, unspecified: Secondary | ICD-10-CM | POA: Diagnosis not present

## 2020-09-27 DIAGNOSIS — R7401 Elevation of levels of liver transaminase levels: Secondary | ICD-10-CM | POA: Diagnosis not present

## 2020-09-27 DIAGNOSIS — E8889 Other specified metabolic disorders: Secondary | ICD-10-CM | POA: Diagnosis present

## 2020-09-27 DIAGNOSIS — E1169 Type 2 diabetes mellitus with other specified complication: Secondary | ICD-10-CM | POA: Diagnosis present

## 2020-09-27 DIAGNOSIS — K7200 Acute and subacute hepatic failure without coma: Secondary | ICD-10-CM | POA: Diagnosis present

## 2020-09-27 DIAGNOSIS — Z992 Dependence on renal dialysis: Secondary | ICD-10-CM | POA: Diagnosis not present

## 2020-09-27 DIAGNOSIS — E860 Dehydration: Secondary | ICD-10-CM | POA: Diagnosis present

## 2020-09-27 DIAGNOSIS — G459 Transient cerebral ischemic attack, unspecified: Secondary | ICD-10-CM | POA: Diagnosis present

## 2020-09-27 DIAGNOSIS — F419 Anxiety disorder, unspecified: Secondary | ICD-10-CM | POA: Diagnosis present

## 2020-09-27 DIAGNOSIS — I2510 Atherosclerotic heart disease of native coronary artery without angina pectoris: Secondary | ICD-10-CM | POA: Diagnosis present

## 2020-09-27 DIAGNOSIS — R7989 Other specified abnormal findings of blood chemistry: Secondary | ICD-10-CM

## 2020-09-27 DIAGNOSIS — K810 Acute cholecystitis: Secondary | ICD-10-CM

## 2020-09-27 DIAGNOSIS — Z20822 Contact with and (suspected) exposure to covid-19: Secondary | ICD-10-CM | POA: Diagnosis present

## 2020-09-27 DIAGNOSIS — R791 Abnormal coagulation profile: Secondary | ICD-10-CM | POA: Diagnosis present

## 2020-09-27 DIAGNOSIS — R319 Hematuria, unspecified: Secondary | ICD-10-CM | POA: Diagnosis present

## 2020-09-27 DIAGNOSIS — D631 Anemia in chronic kidney disease: Secondary | ICD-10-CM | POA: Diagnosis present

## 2020-09-27 DIAGNOSIS — E1142 Type 2 diabetes mellitus with diabetic polyneuropathy: Secondary | ICD-10-CM | POA: Diagnosis present

## 2020-09-27 DIAGNOSIS — I132 Hypertensive heart and chronic kidney disease with heart failure and with stage 5 chronic kidney disease, or end stage renal disease: Secondary | ICD-10-CM | POA: Diagnosis present

## 2020-09-27 DIAGNOSIS — Z6834 Body mass index (BMI) 34.0-34.9, adult: Secondary | ICD-10-CM | POA: Diagnosis not present

## 2020-09-27 DIAGNOSIS — N186 End stage renal disease: Secondary | ICD-10-CM | POA: Diagnosis present

## 2020-09-27 DIAGNOSIS — E1165 Type 2 diabetes mellitus with hyperglycemia: Secondary | ICD-10-CM | POA: Diagnosis present

## 2020-09-27 DIAGNOSIS — I5032 Chronic diastolic (congestive) heart failure: Secondary | ICD-10-CM | POA: Diagnosis present

## 2020-09-27 DIAGNOSIS — K7460 Unspecified cirrhosis of liver: Secondary | ICD-10-CM | POA: Diagnosis present

## 2020-09-27 DIAGNOSIS — N2581 Secondary hyperparathyroidism of renal origin: Secondary | ICD-10-CM | POA: Diagnosis present

## 2020-09-27 DIAGNOSIS — I252 Old myocardial infarction: Secondary | ICD-10-CM | POA: Diagnosis not present

## 2020-09-27 DIAGNOSIS — E669 Obesity, unspecified: Secondary | ICD-10-CM | POA: Diagnosis present

## 2020-09-27 DIAGNOSIS — E1122 Type 2 diabetes mellitus with diabetic chronic kidney disease: Secondary | ICD-10-CM | POA: Diagnosis present

## 2020-09-27 LAB — HEPATITIS PANEL, ACUTE
HCV Ab: NONREACTIVE
Hep A IgM: NONREACTIVE
Hep B C IgM: NONREACTIVE
Hepatitis B Surface Ag: NONREACTIVE

## 2020-09-27 LAB — CBC
HCT: 30.2 % — ABNORMAL LOW (ref 39.0–52.0)
HCT: 30.5 % — ABNORMAL LOW (ref 39.0–52.0)
Hemoglobin: 9.1 g/dL — ABNORMAL LOW (ref 13.0–17.0)
Hemoglobin: 9.8 g/dL — ABNORMAL LOW (ref 13.0–17.0)
MCH: 27.7 pg (ref 26.0–34.0)
MCH: 29.3 pg (ref 26.0–34.0)
MCHC: 30.1 g/dL (ref 30.0–36.0)
MCHC: 32.1 g/dL (ref 30.0–36.0)
MCV: 91.8 fL (ref 80.0–100.0)
MCV: 91 fL (ref 80.0–100.0)
Platelets: 162 10*3/uL (ref 150–400)
Platelets: 183 10*3/uL (ref 150–400)
RBC: 3.29 MIL/uL — ABNORMAL LOW (ref 4.22–5.81)
RBC: 3.35 MIL/uL — ABNORMAL LOW (ref 4.22–5.81)
RDW: 15.7 % — ABNORMAL HIGH (ref 11.5–15.5)
RDW: 15.8 % — ABNORMAL HIGH (ref 11.5–15.5)
WBC: 8 10*3/uL (ref 4.0–10.5)
WBC: 7.4 10*3/uL (ref 4.0–10.5)
nRBC: 0.3 % — ABNORMAL HIGH (ref 0.0–0.2)
nRBC: 0.4 % — ABNORMAL HIGH (ref 0.0–0.2)

## 2020-09-27 LAB — LIPID PANEL
Cholesterol: 132 mg/dL (ref 0–200)
HDL: 26 mg/dL — ABNORMAL LOW (ref >40)
LDL Cholesterol: 81 mg/dL (ref 0–99)
Total CHOL/HDL Ratio: 5.1 RATIO
Triglycerides: 126 mg/dL (ref <150)
VLDL: 25 mg/dL (ref 0–40)

## 2020-09-27 LAB — GLUCOSE, CAPILLARY
Glucose-Capillary: 301 mg/dL — ABNORMAL HIGH (ref 70–99)
Glucose-Capillary: 221 mg/dL — ABNORMAL HIGH (ref 70–99)

## 2020-09-27 LAB — COMPREHENSIVE METABOLIC PANEL
ALT: 1083 U/L — ABNORMAL HIGH (ref 0–44)
AST: 549 U/L — ABNORMAL HIGH (ref 15–41)
Albumin: 3.2 g/dL — ABNORMAL LOW (ref 3.5–5.0)
Alkaline Phosphatase: 123 U/L (ref 38–126)
Anion gap: 18 — ABNORMAL HIGH (ref 5–15)
BUN: 57 mg/dL — ABNORMAL HIGH (ref 6–20)
CO2: 22 mmol/L (ref 22–32)
Calcium: 8.4 mg/dL — ABNORMAL LOW (ref 8.9–10.3)
Chloride: 96 mmol/L — ABNORMAL LOW (ref 98–111)
Creatinine, Ser: 8.06 mg/dL — ABNORMAL HIGH (ref 0.61–1.24)
GFR, Estimated: 7 mL/min — ABNORMAL LOW (ref >60)
Glucose, Bld: 265 mg/dL — ABNORMAL HIGH (ref 70–99)
Potassium: 4.6 mmol/L (ref 3.5–5.1)
Sodium: 136 mmol/L (ref 135–145)
Total Bilirubin: 1.7 mg/dL — ABNORMAL HIGH (ref 0.3–1.2)
Total Protein: 6.7 g/dL (ref 6.5–8.1)

## 2020-09-27 LAB — AMMONIA: Ammonia: 25 umol/L (ref 9–35)

## 2020-09-27 LAB — PROTIME-INR
INR: 1.3 — ABNORMAL HIGH (ref 0.8–1.2)
Prothrombin Time: 15.8 seconds — ABNORMAL HIGH (ref 11.4–15.2)

## 2020-09-27 LAB — ECHOCARDIOGRAM COMPLETE
Area-P 1/2: 3.31 cm2
P 1/2 time: 508 msec
Radius: 0.8 cm
S' Lateral: 4.6 cm

## 2020-09-27 LAB — CREATININE, SERUM
Creatinine, Ser: 7.86 mg/dL — ABNORMAL HIGH (ref 0.61–1.24)
GFR, Estimated: 8 mL/min — ABNORMAL LOW (ref >60)

## 2020-09-27 LAB — ETHANOL: Alcohol, Ethyl (B): <10 mg/dL (ref <10)

## 2020-09-27 LAB — HEMOGLOBIN A1C
Hgb A1c MFr Bld: 10.4 % — ABNORMAL HIGH (ref 4.8–5.6)
Hgb A1c MFr Bld: 10.4 % — ABNORMAL HIGH (ref 4.8–5.6)
Mean Plasma Glucose: 251.78 mg/dL
Mean Plasma Glucose: 251.78 mg/dL

## 2020-09-27 LAB — CBG MONITORING, ED
Glucose-Capillary: 297 mg/dL — ABNORMAL HIGH (ref 70–99)
Glucose-Capillary: 247 mg/dL — ABNORMAL HIGH (ref 70–99)

## 2020-09-27 LAB — ACETAMINOPHEN LEVEL: Acetaminophen (Tylenol), Serum: <10 ug/mL — ABNORMAL LOW (ref 10–30)

## 2020-09-27 MED ORDER — HEPARIN SODIUM (PORCINE) 5000 UNIT/ML IJ SOLN
5000.0000 [IU] | Freq: Three times a day (TID) | INTRAMUSCULAR | Status: DC
  Administered 2020-09-27 – 2020-09-29 (×7): 5000 [IU] via SUBCUTANEOUS
  Filled 2020-09-27 (×7): qty 1

## 2020-09-27 MED ORDER — ACETAMINOPHEN 650 MG RE SUPP: 650.0000 mg | RECTAL | Status: DC | PRN

## 2020-09-27 MED ORDER — INSULIN GLARGINE 100 UNIT/ML ~~LOC~~ SOLN
25.0000 [IU] | Freq: Every day | SUBCUTANEOUS | Status: DC
  Administered 2020-09-27: 25 [IU] via SUBCUTANEOUS
  Filled 2020-09-27 (×2): qty 0.25

## 2020-09-27 MED ORDER — ACETAMINOPHEN 325 MG PO TABS: 650.0000 mg | ORAL_TABLET | ORAL | Status: DC | PRN

## 2020-09-27 MED ORDER — INSULIN ASPART 100 UNIT/ML ~~LOC~~ SOLN
0.0000 [IU] | Freq: Three times a day (TID) | SUBCUTANEOUS | Status: DC
  Administered 2020-09-27: 5 [IU] via SUBCUTANEOUS
  Administered 2020-09-27: 8 [IU] via SUBCUTANEOUS
  Administered 2020-09-27 – 2020-09-28 (×2): 11 [IU] via SUBCUTANEOUS
  Administered 2020-09-28: 2 [IU] via SUBCUTANEOUS
  Administered 2020-09-28: 11 [IU] via SUBCUTANEOUS
  Administered 2020-09-29: 8 [IU] via SUBCUTANEOUS

## 2020-09-27 MED ORDER — STROKE: EARLY STAGES OF RECOVERY BOOK
Freq: Once | Status: AC
  Filled 2020-09-27 (×2): qty 1

## 2020-09-27 MED ORDER — SODIUM CHLORIDE 0.9 % IV SOLN
2.0000 g | INTRAVENOUS | Status: DC
  Filled 2020-09-27: qty 20

## 2020-09-27 MED ORDER — PANTOPRAZOLE SODIUM 40 MG PO TBEC
40.0000 mg | DELAYED_RELEASE_TABLET | Freq: Every day | ORAL | Status: DC
  Administered 2020-09-27 – 2020-09-29 (×3): 40 mg via ORAL
  Filled 2020-09-27 (×3): qty 1

## 2020-09-27 MED ORDER — ASPIRIN EC 81 MG PO TBEC
81.0000 mg | DELAYED_RELEASE_TABLET | Freq: Every day | ORAL | Status: DC
  Administered 2020-09-27 – 2020-09-29 (×3): 81 mg via ORAL
  Filled 2020-09-27 (×3): qty 1

## 2020-09-27 MED ORDER — CHLORHEXIDINE GLUCONATE CLOTH 2 % EX PADS
6.0000 | MEDICATED_PAD | Freq: Every day | CUTANEOUS | Status: DC
  Administered 2020-09-27 – 2020-09-28 (×2): 6 via TOPICAL

## 2020-09-27 MED ORDER — ACETAMINOPHEN 160 MG/5ML PO SOLN: 650.0000 mg | ORAL | Status: DC | PRN

## 2020-09-27 MED ORDER — INSULIN ASPART 100 UNIT/ML ~~LOC~~ SOLN
0.0000 [IU] | Freq: Every day | SUBCUTANEOUS | Status: DC
  Administered 2020-09-27: 2 [IU] via SUBCUTANEOUS
  Administered 2020-09-28: 3 [IU] via SUBCUTANEOUS

## 2020-09-27 NOTE — Consult Note (Signed)
Referring Provider:  [facility]         Primary Care Physician:  [name], MD Primary Gastroenterologist:   Scheduled for screening colonoscopy on Monday with  Dr. Loletha Carrow  We were asked to see this patient for:    Nausea, vomiting and abnormal liver tests              ASSESSMENT / PLAN:   # Nausea, vomiting and elevated liver enzymes.  Ultrasound suggest cholelithiasis, possibly cholecystitis. --Nausea / vomiting was acute and has resolved. Possible viral.  --Surgery was consulted, Dr. Windle Guard recommended GI consult --Acute viral hepatitis panel negative. No Tylenol use ( other than what is in his prn Percocet). Doesn't appear to be drug induced liver injury. Query ischemic injury given history of multiple episodes of vomiting followed by syncopal episode.  --Liver enzymes improved some overnight with AST down to 549, ALT 1083.  Bilirubin 1.7 and alkaline phosphatase remains normal. Acute hepatitis panel negative.  In absence of abdominal pain and leukocytosis it seems unlikely that he has acute cholecystitis. --Follow up liver tests and INR in am. Supportive care.   --Avoid hepatotoxic medications  # Possible cirrhosis versus intrinsic liver disease on ultrasound.  Noncontrast CT October 2021 also suggest cirrhosis.  No evidence for associated portal hypertension. [facility]roscan at [facility] on 09/11/20 does not suggest cirrhosis ( done as part of kidney transplant evaluation)  # Chronic anemia.  Likely anemia of chronic disease.  Hemoglobin stable.  # ESRD on HD Tuesday, Thursday and Saturday. Undergoing evaluation for transplant at [facility]  # DM 2 with polyneuropathy.  # Colon cancer screening - scheduled for screening colonoscopy with Dr. Loletha Carrow next week     HPI:                                                                                                                             Chief Complaint: Nausea, vomiting and abnormal liver test  Edward [name] is a 50 y.o. male   With PMH significant for CHF, ESRD on HD, CAD / MI, DM, HTN , GERD.     Patient presented to the ED yesterday with weakness, nausea/ vomiting and concern for stroke.  The day prior he had multiple episodes of vomiting and abdominal bloating as well as some diarrhea.  WBC normal. Hgb 8.9 at baseline. Liver chemistries were significantly elevated with AST of 807, ALT 1154.  Total bilirubin 2.2, alkaline phosphatase normal.  SARS negative.  Influenza negative. No acute findings on head CT nor MRI.   RUQ ultrasound suggests gallbladder stones/sludge, gallbladder wall thickening possibly cholecystitis, possible cirrhosis/intrinsic liver disease.  Surgery was called, recommended acute hepatitis panel and GI consult.  The day before yesterday patient had multiple episodes of vomiting which is unusual for him. He had mild diarrhea. No associated abdominal pain nor fevers. Patient says that after several episodes of vomiting he passed out for a moment. Patient doesn't take NSAIDs nor Tylenol.  No recent medications changes / additions. No [place] of liver disease  No lower GI complaints. BMs are generally "slow" but not constipated. No blood in stools.  PREVIOUS ENDOSCOPIC EVALUATIONS / PERTINENT STUDIES    08/2020 Fibroscan - [facility]  Adequate reading obtained yes  Comment:reading had large variability Total number of valid readings:21 Total number of invalid readings:8  CAP: 240 dB/m  Median shear wave speed, Vs (m/s) : 2.35 Median equivalent Liver Stiffness Score, E (kPa) : 16.6 Stiffness IQR (interquartile range), E (kPa) : 7.2 Stiffness IQR/median (%): 43%  Interpretation: The Liver Stiffness Score does support the presence of cirrhosis.  Notes: . In normal subjects, the normal liver stiffness assessment has been reported to be less than 6 kPa. In a study of 746 blood donors, the median liver stiffness was 4.4 kPa in the 602 patients without fatty liver on ultrasound and 5.3 kPa in the 144 patients  with fatty liver on ultrasound.1 . For the determination of cirrhosis, multiple values for the threshold of cirrhosis have been reported. Liver stiffness measurements greater than 12.5 kPa have been generally accepted to be consistent with cirrhosis. In a study of 183 patients with HCV, the liver stiffness measurement of 12.5 kPa had the following tests characteristics for the diagnosis of cirrhosis: sensitivity 87%, specificity 91%, positive predictive value 77%, negative predictive value 95%, AUROC 0.95.2 . Liver stiffness measurements are not reliable in the setting of significant liver inflammation or ascites. Measurements are not recommended in these settings.  . Any and all FibroScan studies must be carefully evaluated, taking fully into account all individual measurement/scans, patient history, and other laboratory/imaging results. Any further medical or surgical intervention should be made only while fully considering all available history and clinical data.   09/26/20 RUQ US IMPRESSION: 1. Hyperdense, shadowing biliary material likely reflecting an admixture of tiny stones and biliary sludge with gallbladder wall thickening, somewhat equivocal for cholecystitis given concomitant findings in liver and the absence of a sonographic Murphy sign though could reflect an early or developing acute cholecystitis in the appropriate clinical context. If further imaging evaluation is required, consider HIDA. 2. Slightly lobular hepatic surface contour and parenchymal heterogeneity, can be seen in the setting of cirrhosis/intrinsic liver disease. Correlate with patient history and LFTs.   Past Medical History:  Diagnosis Date  . Anemia    getting Venofer with dialysis  . Anxiety    self reported, sometimes-situational anxiety  . Arthritis    bilateral hands/LEFT hip  . CHF (congestive heart failure) ([place])   . Chronic kidney disease    t th s dialysis- [place] ---ESRD  . Complication of  anesthesia    slow to go to sleep  . Coronary artery disease   . Depression    self reported, sometimes-situational depression  . Diabetes mellitus    type II- on meds  . Dyspnea    sometimes- fluid  . GERD (gastroesophageal reflux disease)    on meds  . Hypertension    no longer on medication since starting on Hemodialysis  . Myocardial infarction [facility]) 2019    Past Surgical History:  Procedure Laterality Date  . A/V FISTULAGRAM Left 03/06/2020   Procedure: A/V FISTULAGRAM;  Surgeon: Waynetta Sandy, MD;  Location: [place] [facility];  Service: Cardiovascular;  Laterality: Left;  . AMPUTATION Left 05/10/2016   Procedure: Left Transmetatarsal Amputation;  Surgeon: [name], MD;  Location: [place];  Service: Orthopedics;  Laterality: Left;  . AMPUTATION Right 05/16/2018  Procedure: PARTIAL RAY AMPUTATION FIFTH TOE RIGHT FOOT;  Surgeon: [name], DPM;  Location: [place];  Service: Podiatry;  Laterality: Right;  . AMPUTATION Right 06/17/2018   Procedure: AMPUTATION 4th RAY Right Foot;  Surgeon: Trula Slade, DPM;  Location: [place];  Service: Podiatry;  Laterality: Right;  . AMPUTATION Right 06/19/2018   Procedure: RIGHT BELOW KNEE AMPUTATION;  Surgeon: [name], MD;  Location: [place];  Service: Orthopedics;  Laterality: Right;  . APPLICATION OF WOUND VAC Right 06/19/2018   Procedure: APPLICATION OF WOUND VAC;  Surgeon: [name], MD;  Location: [place];  Service: Orthopedics;  Laterality: Right;  . AV FISTULA PLACEMENT Left 11/25/2019   Procedure: LEFT ARM Brachiocephalic  ARTERIOVENOUS (AV) FISTULA CREATION;  Surgeon: Rosetta Posner, MD;  Location: [place];  Service: Vascular;  Laterality: Left;  . BASCILIC VEIN TRANSPOSITION Left 07/19/2020   Procedure: LEFT SECOND STAGE BASCILIC VEIN TRANSPOSITION;  Surgeon: Waynetta Sandy, MD;  Location: [place];  Service: Vascular;  Laterality: Left;  . CARDIAC CATHETERIZATION  07/29/2018  . CIRCUMCISION  09/02/2011    Procedure: CIRCUMCISION ADULT;  Surgeon: Molli Hazard, MD;  Location: [place];  Service: Urology;  Laterality: N/A;  . CORONARY STENT INTERVENTION N/A 07/29/2018   Procedure: CORONARY STENT INTERVENTION;  Surgeon: Leonie Man, MD;  Location: [place] [facility];  Service: Cardiovascular;  Laterality: N/A;  . I & D EXTREMITY  09/02/2011   Procedure: IRRIGATION AND DEBRIDEMENT EXTREMITY;  Surgeon: Theodoro Kos, DO;  Location: [place];  Service: Plastics;  Laterality: N/A;  . INCISION AND DRAINAGE OF WOUND  08/12/2011   Procedure: IRRIGATION AND DEBRIDEMENT WOUND;  Surgeon: Zenovia Jarred, MD;  Location: [place];  Service: General;;  perineum  . INSERTION OF DIALYSIS CATHETER N/A 11/25/2019   Procedure: INSERTION OF Righ Internal Jugular DIALYSIS CATHETER;  Surgeon: Rosetta Posner, MD;  Location: [place];  Service: Vascular;  Laterality: N/A;  . IRRIGATION AND DEBRIDEMENT ABSCESS  08/12/2011   Procedure: IRRIGATION AND DEBRIDEMENT ABSCESS;  Surgeon: Molli Hazard, MD;  Location: [place];  Service: Urology;  Laterality: N/A;  irrigation and debridment perineum    . IRRIGATION AND DEBRIDEMENT ABSCESS  08/14/2011   Procedure: MINOR INCISION AND DRAINAGE OF ABSCESS;  Surgeon: Molli Hazard, MD;  Location: [place];  Service: Urology;  Laterality: N/A;  Perineal Wound Debridement;Placement Bilateral Testicular Thigh Pouches  . LEFT HEART CATH AND CORONARY ANGIOGRAPHY N/A 07/29/2018   Procedure: LEFT HEART CATH AND CORONARY ANGIOGRAPHY;  Surgeon: Leonie Man, MD;  Location: [place] [facility];  Service: Cardiovascular;  Laterality: N/A;  . REVISON OF ARTERIOVENOUS FISTULA Left 05/24/2020   Procedure: LEFT ARM ARTERIOVENOUS FISTULA  CONVERSION TO BASILIC VEIN FISTULA;  Surgeon: Waynetta Sandy, MD;  Location: [place];  Service: Vascular;  Laterality: Left;  . TOTAL HIP ARTHROPLASTY Left 10/11/2016   Procedure: LEFT TOTAL HIP ARTHROPLASTY  ANTERIOR APPROACH;  Surgeon: Mcarthur Rossetti, MD;  Location: [facility];  Service: Orthopedics;  Laterality: Left;    Prior to Admission medications   Medication Sig Start Date End Date Taking? Authorizing Provider  aspirin EC 81 MG tablet Take 1 tablet (81 mg total) by mouth daily. Swallow whole. 03/14/20  Yes Lelon Perla, MD  bismuth subsalicylate (PEPTO BISMOL) 262 MG/15ML suspension Take 30 mLs by mouth every 6 (six) hours as needed for indigestion.   Yes [provider]  blood glucose meter  kit and supplies Dispense based on patient and insurance preference. Use up to four times daily as directed. (FOR ICD-9 250.00, 250.01). 07/01/18  Yes Angiulli, Lavon Paganini, PA-C  cholecalciferol (VITAMIN D3) 25 MCG (1000 UNIT) tablet Take 1,000 Units by mouth Every Tuesday,Thursday,and Saturday with dialysis.    Yes [provider]  doxercalciferol (HECTOROL) 4 MCG/2ML injection Doxercalciferol (Hectorol) 07/22/20 07/21/21 Yes [provider]  Insulin Pen Needle 31G X 5 MM MISC Use daily to inject insulin as instructed 07/01/18  Yes Angiulli, Lavon Paganini, PA-C  iron sucrose (VENOFER) 20 MG/ML injection Iron Sucrose (Venofer) 09/14/20 10/05/20 Yes [provider]  LANTUS SOLOSTAR 100 UNIT/ML Solostar Pen Inject 25 Units into the skin in the morning, at noon, and at bedtime. 06/08/19  Yes [provider]  Methoxy PEG-Epoetin Beta (MIRCERA IJ) Mircera 08/31/20 08/30/21 Yes [provider]  pantoprazole (PROTONIX) 40 MG tablet Take 40 mg by mouth daily. 08/16/20  Yes [provider]  prednisoLONE acetate (PRED FORTE) 1 % ophthalmic suspension Place 1 drop into both eyes 3 (three) times daily. 08/16/20  Yes [provider]  VELPHORO 500 MG chewable tablet Chew 500 mg by mouth 3 (three) times daily with meals.  06/14/20  Yes [provider]  oxyCODONE-acetaminophen (PERCOCET) 5-325 MG tablet Take 1 tablet by mouth every 4 (four) hours as  needed for severe pain. Patient not taking: No sig reported 07/19/20 07/19/21  Karoline Caldwell, PA-C    Current Facility-Administered Medications  Medication Dose Route Frequency Provider Last Rate Last Admin  .  stroke: mapping our early stages of recovery book   Does not apply Once Chotiner, Yevonne Aline, MD      . 0.9 %  sodium chloride infusion  250 mL Intravenous PRN Waynetta Sandy, MD      . aspirin EC tablet 81 mg  81 mg Oral Daily Chotiner, Yevonne Aline, MD      . cefTRIAXone (ROCEPHIN) 2 g in sodium chloride 0.9 % 100 mL IVPB  2 g Intravenous Q24H Lavina Hamman, MD      . heparin injection 5,000 Units  5,000 Units Subcutaneous Q8H Chotiner, Yevonne Aline, MD   5,000 Units at 09/27/20 [phone]  . insulin aspart (novoLOG) injection 0-15 Units  0-15 Units Subcutaneous TID WC Chotiner, Yevonne Aline, MD      . insulin aspart (novoLOG) injection 0-5 Units  0-5 Units Subcutaneous QHS Chotiner, Yevonne Aline, MD      . insulin glargine (LANTUS) injection 25 Units  25 Units Subcutaneous Q2200 Chotiner, Yevonne Aline, MD      . pantoprazole (PROTONIX) EC tablet 40 mg  40 mg Oral Daily Chotiner, Yevonne Aline, MD      . sodium chloride flush (NS) 0.9 % injection 3 mL  3 mL Intravenous Q12H Waynetta Sandy, MD      . sodium chloride flush (NS) 0.9 % injection 3 mL  3 mL Intravenous PRN Waynetta Sandy, MD       Current Outpatient Medications  Medication Sig Dispense Refill  . aspirin EC 81 MG tablet Take 1 tablet (81 mg total) by mouth daily. Swallow whole. 90 tablet 3  . bismuth subsalicylate (PEPTO BISMOL) 262 MG/15ML suspension Take 30 mLs by mouth every 6 (six) hours as needed for indigestion.    . blood glucose meter kit and supplies Dispense based on patient and insurance preference. Use up to four times daily as directed. (FOR ICD-9 250.00, 250.01). 1 each 0  . cholecalciferol (  VITAMIN D3) 25 MCG (1000 UNIT) tablet Take 1,000 Units by mouth Every Tuesday,Thursday,and Saturday with  dialysis.     Marland Kitchen doxercalciferol (HECTOROL) 4 MCG/2ML injection Doxercalciferol (Hectorol)    . Insulin Pen Needle 31G X 5 MM MISC Use daily to inject insulin as instructed 100 each 2  . iron sucrose (VENOFER) 20 MG/ML injection Iron Sucrose (Venofer)    . LANTUS SOLOSTAR 100 UNIT/ML Solostar Pen Inject 25 Units into the skin in the morning, at noon, and at bedtime.    . Methoxy PEG-Epoetin Beta (MIRCERA IJ) Mircera    . pantoprazole (PROTONIX) 40 MG tablet Take 40 mg by mouth daily.    . prednisoLONE acetate (PRED FORTE) 1 % ophthalmic suspension Place 1 drop into both eyes 3 (three) times daily.    . VELPHORO 500 MG chewable tablet Chew 500 mg by mouth 3 (three) times daily with meals.     Marland Kitchen oxyCODONE-acetaminophen (PERCOCET) 5-325 MG tablet Take 1 tablet by mouth every 4 (four) hours as needed for severe pain. (Patient not taking: No sig reported) 20 tablet 0    Allergies as of 09/26/2020 - Review Complete 09/26/2020  Allergen Reaction Noted  . Crestor [rosuvastatin] Rash 04/17/2020    Family History  Problem Relation Age of Onset  . Diabetes Other   . Pancreatic cancer Mother 35  . Colon cancer Neg Hx   . Colon polyps Neg Hx   . Esophageal cancer Neg Hx   . Rectal cancer Neg Hx   . Stomach cancer Neg Hx     Social History   Socioeconomic History  . Marital status: Widowed    Spouse name: Not on file  . Number of children: Not on file  . Years of education: Not on file  . Highest education level: Not on file  Occupational History  . Occupation: Disabled  Tobacco Use  . Smoking status: Former Smoker    Packs/day: 0.25    Years: 24.00    Pack years: 6.00    Types: Cigarettes    Quit date: 11/19/2019    Years since quitting: 0.8  . Smokeless tobacco: Never Used  Vaping Use  . Vaping Use: Never used  Substance and Sexual Activity  . Alcohol use: Not Currently    Alcohol/week: 0.0 standard drinks    Comment: quit 2018  . Drug use: No  . Sexual activity: Not Currently     Birth control/protection: Condom  Other Topics Concern  . Not on file  Social History Narrative   Lives w/ his 2 sons.   Social Determinants of Health   Financial Resource Strain: Not on file  Food Insecurity: Not on file  Transportation Needs: Not on file  Physical Activity: Not on file  Stress: Not on file  Social Connections: Not on file  Intimate Partner Violence: Not on file    Review of Systems: All systems reviewed and negative except where noted in HPI.  OBJECTIVE:    Physical Exam: Vital signs in last 24 hours: Temp:  [99 F (37.2 C)-99.5 F (37.5 C)] 99 F (37.2 C) (02/09 0200) Pulse Rate:  [76-88] 76 (02/09 0830) Resp:  [12-29] 14 (02/09 0830) BP: (113-152)/(62-91) 142/80 (02/09 0830) SpO2:  [89 %-100 %] 96 % (02/09 0830)   General:   Alert male in NAD Psych:  Pleasant, cooperative. Normal mood and affect. Eyes:  Pupils equal, sclera clear, no icterus.   Conjunctiva pink. Ears:  Normal auditory acuity. Nose:  No deformity, discharge,  or  lesions. Neck:  Supple; no masses Lungs:  Clear throughout to auscultation.   No wheezes, crackles, or rhonchi.  Heart:  Regular rate and rhythm; no murmurs, no lower extremity edema Abdomen:  Soft, non-distended, nontender, BS active, no palp mass   Rectal:  Deferred  Msk:  R BKA Neurologic:  Alert and  oriented x4;  grossly normal neurologically. Skin:  Intact without significant lesions or rashes.   Scheduled inpatient medications .  stroke: mapping our early stages of recovery book   Does not apply Once  . aspirin EC  81 mg Oral Daily  . heparin  5,000 Units Subcutaneous Q8H  . insulin aspart  0-15 Units Subcutaneous TID WC  . insulin aspart  0-5 Units Subcutaneous QHS  . insulin glargine  25 Units Subcutaneous Q2200  . pantoprazole  40 mg Oral Daily  . sodium chloride flush  3 mL Intravenous Q12H      Intake/Output from previous day: No intake/output data recorded. Intake/Output this shift: No  intake/output data recorded.   Lab Results: Recent Labs    09/26/20 1900 09/27/20 0205 09/27/20 0452  WBC 10.0 8.0 7.4  HGB 9.9* 9.1* 9.8*  HCT 31.6* 30.2* 30.5*  PLT 174 162 183   BMET Recent Labs    09/26/20 1748 09/26/20 1900 09/27/20 0205 09/27/20 0452  NA 135 135  --  136  K 4.6 4.6  --  4.6  CL 94* 93*  --  96*  CO2  --  28  --  22  GLUCOSE 321* 306*  --  265*  BUN 45* 45*  --  57*  CREATININE 7.30* 7.00* 7.86* 8.06*  CALCIUM  --  8.4*  --  8.4*   LFT Recent Labs    09/27/20 0452  PROT 6.7  ALBUMIN 3.2*  AST 549*  ALT 1,083*  ALKPHOS 123  BILITOT 1.7*   PT/INR Recent Labs    09/26/20 1900  LABPROT 18.3*  INR 1.6*   Hepatitis Panel Recent Labs    09/26/20 2312  HEPBSAG NON REACTIVE  HCVAB NON REACTIVE  HEPAIGM NON REACTIVE  HEPBIGM NON REACTIVE     . CBC Latest Ref Rng & Units 09/27/2020 09/27/2020 09/26/2020  WBC 4.0 - 10.5 K/uL 7.4 8.0 10.0  Hemoglobin 13.0 - 17.0 g/dL 9.8(L) 9.1(L) 9.9(L)  Hematocrit 39.0 - 52.0 % 30.5(L) 30.2(L) 31.6(L)  Platelets 150 - 400 K/uL 183 162 174    . CMP Latest Ref Rng & Units 09/27/2020 09/27/2020 09/26/2020  Glucose 70 - 99 mg/dL 265(H) - 306(H)  BUN 6 - 20 mg/dL 57(H) - 45(H)  Creatinine 0.61 - 1.24 mg/dL 8.06(H) 7.86(H) 7.00(H)  Sodium 135 - 145 mmol/L 136 - 135  Potassium 3.5 - 5.1 mmol/L 4.6 - 4.6  Chloride 98 - 111 mmol/L 96(L) - 93(L)  CO2 22 - 32 mmol/L 22 - 28  Calcium 8.9 - 10.3 mg/dL 8.4(L) - 8.4(L)  Total Protein 6.5 - 8.1 g/dL 6.7 - 6.8  Total Bilirubin 0.3 - 1.2 mg/dL 1.7(H) - 2.2(H)  Alkaline Phos 38 - 126 U/L 123 - 106  AST 15 - 41 U/L 549(H) - 807(H)  ALT 0 - 44 U/L 1,083(H) - 1,154(H)   Studies/Results: CT HEAD WO CONTRAST  Result Date: 09/26/2020 CLINICAL DATA:  Transient neurologic deficit with decreased left hand grip strength lasting approximately 5 minutes EXAM: CT HEAD WITHOUT CONTRAST TECHNIQUE: Contiguous axial images were obtained from the base of the skull through the vertex  without intravenous contrast. COMPARISON:  None. FINDINGS: Brain: Hypoattenuating foci in the bilateral insula/external capsules may reflect sequela prior lacunar type infarcts, favor remote though are overall age indeterminate. No other sites of acute CT evident large vascular territory infarct. No evidence of acute hemorrhage, hydrocephalus, extra-axial collection, visible mass lesion or mass effect. Slight prominence of the ventricles, cisterns and sulci compatible with parenchymal volume loss. Vascular: Atherosclerotic calcification of the carotid siphons. No hyperdense vessel. Skull: No calvarial fracture or suspicious osseous lesion. No scalp swelling or hematoma. Sinuses/Orbits: Paranasal sinuses and mastoid air cells are predominantly clear. Orbital structures are unremarkable aside from prior lens extractions. Other: None IMPRESSION: 1. Hypoattenuating foci in the bilateral insula/external capsules may reflect sequela prior lacunar type infarcts, favor remote though are overall age indeterminate. If there is persisting concern for acute infarction, MRI is more sensitive and specific for early features of ischemia. 2. No other sites of acute CT evident large vascular territory or cortically based infarct. No other acute intracranial abnormality. 3. Intracranial atherosclerosis. Electronically Signed   By: Lovena Le M.D.   On: 09/26/2020 17:52   MR BRAIN WO CONTRAST  Result Date: 09/26/2020 CLINICAL DATA:  Weakness in the left hand. EXAM: MRI HEAD WITHOUT CONTRAST TECHNIQUE: Multiplanar, multiecho pulse sequences of the brain and surrounding structures were obtained without intravenous contrast. COMPARISON:  None. FINDINGS: Brain: No acute infarct, mass effect or extra-axial collection. No acute or chronic hemorrhage. There is multifocal hyperintense T2-weighted signal within the white matter. Parenchymal volume and CSF spaces are normal. The midline structures are normal. Vascular: Major flow voids are  preserved. Skull and upper cervical spine: Normal calvarium and skull base. Visualized upper cervical spine and soft tissues are normal. Sinuses/Orbits:No paranasal sinus fluid levels or advanced mucosal thickening. No mastoid or middle ear effusion. Normal orbits. IMPRESSION: 1. No acute intracranial abnormality. 2. Findings of mild chronic small vessel ischemic disease. Electronically Signed   By: Ulyses Jarred M.D.   On: 09/26/2020 23:10   US Abdomen Limited RUQ (LIVER/GB)  Result Date: 09/26/2020 CLINICAL DATA:  Right upper quadrant pain, discomfort EXAM: ULTRASOUND ABDOMEN LIMITED RIGHT UPPER QUADRANT COMPARISON:  CT 06/09/2020, renal ultrasound 07/12/2019 FINDINGS: Gallbladder: Hyperdense, shadowing biliary material likely reflecting an admixture of tiny stones and biliary sludge. Mild irregular gallbladder wall thickening without pericholecystic fluid. Sonographic Percell Miller sign is reportedly negative. Common bile duct: Diameter: 4.2 mm, nondilated Liver: Slightly lobular hepatic surface contour with mild heterogeneity of the hepatic parenchyma. No intrahepatic biliary ductal dilatation. No concerning focal hepatic lesion. Portal vein is patent on color Doppler imaging with normal direction of blood flow towards the liver. Other: None. IMPRESSION: 1. Hyperdense, shadowing biliary material likely reflecting an admixture of tiny stones and biliary sludge with gallbladder wall thickening, somewhat equivocal for cholecystitis given concomitant findings in liver and the absence of a sonographic Murphy sign though could reflect an early or developing acute cholecystitis in the appropriate clinical context. If further imaging evaluation is required, consider HIDA. 2. Slightly lobular hepatic surface contour and parenchymal heterogeneity, can be seen in the setting of cirrhosis/intrinsic liver disease. Correlate with patient history and LFTs. Electronically Signed   By: Lovena Le M.D.   On: 09/26/2020 21:02     Principal Problem:   TIA (transient ischemic attack) Active Problems:   Diabetic polyneuropathy associated with type 2 diabetes mellitus ([place])   Anemia of chronic disease   Essential hypertension   ESRD on hemodialysis ([place])   Acute cholecystitis   Elevated LFTs    Edward Savoy,  NP-C @  09/27/2020, 8:49 AM

## 2020-09-27 NOTE — Plan of Care (Signed)
°  Problem: Education: °Goal: Knowledge of disease or condition will improve °Outcome: Progressing °Goal: Knowledge of secondary prevention will improve °Outcome: Progressing °Goal: Knowledge of patient specific risk factors addressed and post discharge goals established will improve °Outcome: Progressing °Goal: Individualized Educational Video(s) °Outcome: Progressing °  °Problem: Coping: °Goal: Will verbalize positive feelings about self °Outcome: Progressing °Goal: Will identify appropriate support needs °Outcome: Progressing °  °Problem: Health Behavior/Discharge Planning: °Goal: Ability to manage health-related needs will improve °Outcome: Progressing °  °Problem: Self-Care: °Goal: Ability to participate in self-care as condition permits will improve °Outcome: Progressing °Goal: Verbalization of feelings and concerns over difficulty with self-care will improve °Outcome: Progressing °Goal: Ability to communicate needs accurately will improve °Outcome: Progressing °  °Problem: Nutrition: °Goal: Risk of aspiration will decrease °Outcome: Progressing °Goal: Dietary intake will improve °Outcome: Progressing °  °Problem: Ischemic Stroke/TIA Tissue Perfusion: °Goal: Complications of ischemic stroke/TIA will be minimized °Outcome: Progressing °  °Problem: Education: °Goal: Knowledge of General Education information will improve °Description: Including pain rating scale, medication(s)/side effects and non-pharmacologic comfort measures °Outcome: Progressing °  °Problem: Health Behavior/Discharge Planning: °Goal: Ability to manage health-related needs will improve °Outcome: Progressing °  °Problem: Clinical Measurements: °Goal: Ability to maintain clinical measurements within normal limits will improve °Outcome: Progressing °Goal: Will remain free from infection °Outcome: Progressing °Goal: Diagnostic test results will improve °Outcome: Progressing °Goal: Respiratory complications will improve °Outcome: Progressing °Goal:  Cardiovascular complication will be avoided °Outcome: Progressing °  °Problem: Activity: °Goal: Risk for activity intolerance will decrease °Outcome: Progressing °  °Problem: Nutrition: °Goal: Adequate nutrition will be maintained °Outcome: Progressing °  °Problem: Coping: °Goal: Level of anxiety will decrease °Outcome: Progressing °  °Problem: Elimination: °Goal: Will not experience complications related to bowel motility °Outcome: Progressing °Goal: Will not experience complications related to urinary retention °Outcome: Progressing °  °Problem: Pain Managment: °Goal: General experience of comfort will improve °Outcome: Progressing °  °Problem: Safety: °Goal: Ability to remain free from injury will improve °Outcome: Progressing °  °Problem: Skin Integrity: °Goal: Risk for impaired skin integrity will decrease °Outcome: Progressing °  °

## 2020-09-27 NOTE — Progress Notes (Signed)
Echocardiogram 2D Echocardiogram has been performed.  Edward [name] 09/27/2020, 1:09 PM

## 2020-09-27 NOTE — Progress Notes (Signed)
Inpatient Diabetes Program Recommendations  AACE/ADA: New Consensus Statement on Inpatient Glycemic Control (2015)  Target Ranges:  Prepandial:   less than 140 mg/dL      Peak postprandial:   less than 180 mg/dL (1-2 hours)      Critically ill patients:  140 - 180 mg/dL   Lab Results  Component Value Date   GLUCAP 297 (H) 09/27/2020   HGBA1C 10.4 (H) 09/27/2020    Review of Glycemic Control  Diabetes history: DM 2 Outpatient Diabetes medications: Lantus 25 units tid Current orders for Inpatient glycemic control:  Lantus 25 units tid Novolog 0-15 units tid + hs  A1c 10.4%  Spoke with pt at bedside regarding glucose control and A1c level at home. Pt reports not checking glucose everyday due to difficulty getting blood due to calluses. Reviewed areas on fingers to stick for glucose checks. Asked pt to check glucose at least everyday if not twice a day. Pt reports he has a follow up with his doctor this month. Pt reports he feels he needs to be placed back on the 70/30 insulin. Discussed how 70/30 insulin works and that it maybe better than being on Lantus 3 times a day.  Thanks, Tama Headings RN, MSN, BC-ADM Inpatient Diabetes Coordinator Team Pager [phone] (8a-5p)

## 2020-09-27 NOTE — Progress Notes (Signed)
Occupational Therapy Evaluation Patient Details Name: Edward [name] MRN: [id number] DOB: [birth year] Today's Date: 09/27/2020    History of Present Illness 50 y.o. male with medical history significant for anemia, anxiety, CHF, ESRD, CAD, R BKA, L transmet amputation, diabetes, GERD, hypertension, MI, who presents for evaluation of weakness and tingling in left hand which occurred. He had multiple episodes of vomiting and was having abdominal bloating and pain in right upper abdomen as well.   Clinical Impression   Pt states he told his nurse at [facility] about the episode with his arm and she told him to come to the ED. Pt appears to be functioning at his baseline level, which is modified independent with use of assistive device. He is curious if his medication is making him nauseated. Pt will have support of his son's to DC home safely @ modified independent level. Reviewed signs and symptoms of CVA using [employer]. Pt verbalized understanding. OT signing off.     Follow Up Recommendations  No OT follow up    Equipment Recommendations  None recommended by OT    Recommendations for Other Services       Precautions / Restrictions Precautions Precautions: Fall Required Braces or Orthoses: Other Brace Other Brace: R prosthetic leg      Mobility Bed Mobility Overal bed mobility: Modified Independent                  Transfers Overall transfer level: Modified independent                    Balance Overall balance assessment: Mild deficits observed, not formally tested                                         ADL either performed or assessed with clinical judgement   ADL Overall ADL's : At baseline                                             Vision   Additional Comments: Just had cataracts removed; vision is 'amazing'     Perception     Praxis      Pertinent Vitals/Pain Pain Assessment: No/denies pain     Hand Dominance  Right   Extremity/Trunk Assessment Upper Extremity Assessment Upper Extremity Assessment: LUE deficits/detail LUE Deficits / Details: Had some difficulty using his Lhand/arm, then went away after 5 min; WFL at this time   Lower Extremity Assessment Lower Extremity Assessment: Defer to PT evaluation   Cervical / Trunk Assessment Cervical / Trunk Assessment: Normal   Communication Communication Communication: No difficulties   Cognition Arousal/Alertness: Awake/alert Behavior During Therapy: WFL for tasks assessed/performed Overall Cognitive Status: Within Functional Limits for tasks assessed                                     General Comments       Exercises     Shoulder Instructions      Home Living Family/patient expects to be discharged to:: Private residence Living Arrangements: Children Available Help at Discharge: Family;Available 24 hours/day Type of Home: House Home Access: Stairs to enter [employer] of Steps: 2 Entrance Stairs-Rails: Right;Left Home Layout: One  level     Bathroom Shower/Tub: Occupational psychologist: Standard Bathroom Accessibility: Yes How Accessible: Accessible via walker Home Equipment: [place] - single point;Shower seat;Bedside commode;Walker - 2 wheels;Wheelchair - manual          Prior Functioning/Environment Level of Independence: Independent with assistive device(s);Needs assistance  Gait / Transfers Assistance Needed: independent with AD; no recent falls ADL's / Homemaking Assistance Needed: independent with adl; A for IADL tasks            OT Problem List: Impaired sensation      OT Treatment/Interventions:      OT Goals(Current goals can be found in the care plan section) Acute Rehab OT Goals Patient Stated Goal: to find our if his medication is causing his nausea OT Goal Formulation: All assessment and education complete, DC therapy  OT Frequency:     Barriers to D/C:             Co-evaluation PT/OT/SLP Co-Evaluation/Treatment: Yes (partial session)     OT goals addressed during session: ADL's and self-care      AM-PAC OT "6 Clicks" Daily Activity     Outcome Measure Help from another person eating meals?: None Help from another person taking care of personal grooming?: None Help from another person toileting, which includes using toliet, bedpan, or urinal?: None Help from another person bathing (including washing, rinsing, drying)?: None Help from another person to put on and taking off regular upper body clothing?: None Help from another person to put on and taking off regular lower body clothing?: None 6 Click Score: 24   End of Session Equipment Utilized During Treatment: Gait belt;Rolling walker Nurse Communication: Mobility status  Activity Tolerance: Patient tolerated treatment well Patient left: in bed;Other (comment) (sitting edge of stretcher)  OT Visit Diagnosis: Muscle weakness (generalized) (M62.81)                Time: 4360-6770 OT Time Calculation (min): 23 min Charges:  OT General Charges $OT Visit: 1 Visit OT Evaluation $OT Eval Low Complexity: [place], OT/L   Acute OT Clinical Specialist Acute Rehabilitation Services Pager [phone] Office [phone]   [facility] 09/27/2020, 11:57 AM

## 2020-09-27 NOTE — ED Notes (Signed)
Admitted hospitalist, [place] Breakfast order placed

## 2020-09-27 NOTE — Progress Notes (Signed)
VAST consult received to start PIV. Upon arrival at bedside, pt having echo.

## 2020-09-27 NOTE — Consult Note (Signed)
[facility] Surgery Consult Note  Edward [name] 1971-05-31  [id number].    Requesting MD: Berle Mull Chief Complaint/Reason for Consult: elevated LFTs  HPI:  Edward [name] is a 50yo male PMH ESRD on HD T/Th/Sat, DM, CHF, CAD hx MI 07/2018, HLD, HTN who presented to [facility] last night with chief complaint of LUE weakness. States that he noticed difficulty holding an object 2 days ago with his left hand. This was transient and resolved without intervention. Denies any facial droop, vision changes, or aphasia. He also reports having several episodes of n/v prior to this. Denies any abdominal pain. N/v resolved and he was able to tolerate a regular diet yesterday, still without any abdominal pain. Denies any h/o biliary colic symptoms including postprandial RUQ discomfort in the past.  In the ED patient underwent u/s of his abdomen that shows slightly lobular hepatic surface contour and parenchymal heterogeneity suspicious for possible cirrhosis/intrinsic liver disease; shadowing biliary material likely reflecting an admixture of tiny stones and biliary sludge with gallbladder wall thickening, negative sonographic Murphy sign. WBC 10. LFTs elevated with AST 807, ALT 1154, Alk phos 106, Tbili 2.2. Lipase WNL. LFTs down trending this AM. Patient was admitted to the medical service and is being worked up for possible stroke. General surgery called to consult regarding possible cholecystitis.   Abdominal surgical history: none Anticoagulants: none Former smoker - quit about 1 year ago Denies alcohol or illicit drug use Lives at home with his son  Review of Systems  Respiratory: Negative.   Cardiovascular: Negative.   Gastrointestinal: Positive for nausea and vomiting. Negative for abdominal pain.  Neurological: Positive for focal weakness.   All systems reviewed and otherwise negative except for as above  Family History  Problem Relation Age of Onset  . Diabetes Other   . Pancreatic  cancer Mother 58  . Colon cancer Neg Hx   . Colon polyps Neg Hx   . Esophageal cancer Neg Hx   . Rectal cancer Neg Hx   . Stomach cancer Neg Hx     Past Medical History:  Diagnosis Date  . Anemia    getting Venofer with dialysis  . Anxiety    self reported, sometimes-situational anxiety  . Arthritis    bilateral hands/LEFT hip  . CHF (congestive heart failure) ([place])   . Chronic kidney disease    t th s dialysis- [place] ---ESRD  . Complication of anesthesia    slow to go to sleep  . Coronary artery disease   . Depression    self reported, sometimes-situational depression  . Diabetes mellitus    type II- on meds  . Dyspnea    sometimes- fluid  . GERD (gastroesophageal reflux disease)    on meds  . Hypertension    no longer on medication since starting on Hemodialysis  . Myocardial infarction [facility]) 2019    Past Surgical History:  Procedure Laterality Date  . A/V FISTULAGRAM Left 03/06/2020   Procedure: A/V FISTULAGRAM;  Surgeon: Waynetta Sandy, MD;  Location: [place] [facility];  Service: Cardiovascular;  Laterality: Left;  . AMPUTATION Left 05/10/2016   Procedure: Left Transmetatarsal Amputation;  Surgeon: [name], MD;  Location: [place];  Service: Orthopedics;  Laterality: Left;  . AMPUTATION Right 05/16/2018   Procedure: PARTIAL RAY AMPUTATION FIFTH TOE RIGHT FOOT;  Surgeon: [name], DPM;  Location: [place];  Service: Podiatry;  Laterality: Right;  . AMPUTATION Right 06/17/2018   Procedure: AMPUTATION 4th RAY Right Foot;  Surgeon: Trula Slade, DPM;  Location: [place];  Service: Podiatry;  Laterality: Right;  . AMPUTATION Right 06/19/2018   Procedure: RIGHT BELOW KNEE AMPUTATION;  Surgeon: [name], MD;  Location: [place];  Service: Orthopedics;  Laterality: Right;  . APPLICATION OF WOUND VAC Right 06/19/2018   Procedure: APPLICATION OF WOUND VAC;  Surgeon: [name], MD;  Location: [place];  Service: Orthopedics;  Laterality: Right;  . AV  FISTULA PLACEMENT Left 11/25/2019   Procedure: LEFT ARM Brachiocephalic  ARTERIOVENOUS (AV) FISTULA CREATION;  Surgeon: Rosetta Posner, MD;  Location: [place];  Service: Vascular;  Laterality: Left;  . BASCILIC VEIN TRANSPOSITION Left 07/19/2020   Procedure: LEFT SECOND STAGE BASCILIC VEIN TRANSPOSITION;  Surgeon: Waynetta Sandy, MD;  Location: [place];  Service: Vascular;  Laterality: Left;  . CARDIAC CATHETERIZATION  07/29/2018  . CIRCUMCISION  09/02/2011   Procedure: CIRCUMCISION ADULT;  Surgeon: Molli Hazard, MD;  Location: [place];  Service: Urology;  Laterality: N/A;  . CORONARY STENT INTERVENTION N/A 07/29/2018   Procedure: CORONARY STENT INTERVENTION;  Surgeon: Leonie Man, MD;  Location: [place] [facility];  Service: Cardiovascular;  Laterality: N/A;  . I & D EXTREMITY  09/02/2011   Procedure: IRRIGATION AND DEBRIDEMENT EXTREMITY;  Surgeon: Theodoro Kos, DO;  Location: [place];  Service: Plastics;  Laterality: N/A;  . INCISION AND DRAINAGE OF WOUND  08/12/2011   Procedure: IRRIGATION AND DEBRIDEMENT WOUND;  Surgeon: Zenovia Jarred, MD;  Location: [place];  Service: General;;  perineum  . INSERTION OF DIALYSIS CATHETER N/A 11/25/2019   Procedure: INSERTION OF [place] Internal Jugular DIALYSIS CATHETER;  Surgeon: Rosetta Posner, MD;  Location: [place];  Service: Vascular;  Laterality: N/A;  . IRRIGATION AND DEBRIDEMENT ABSCESS  08/12/2011   Procedure: IRRIGATION AND DEBRIDEMENT ABSCESS;  Surgeon: Molli Hazard, MD;  Location: [place];  Service: Urology;  Laterality: N/A;  irrigation and debridment perineum    . IRRIGATION AND DEBRIDEMENT ABSCESS  08/14/2011   Procedure: MINOR INCISION AND DRAINAGE OF ABSCESS;  Surgeon: Molli Hazard, MD;  Location: [place];  Service: Urology;  Laterality: N/A;  Perineal Wound Debridement;Placement Bilateral Testicular Thigh Pouches  . LEFT HEART CATH AND CORONARY ANGIOGRAPHY N/A 07/29/2018    Procedure: LEFT HEART CATH AND CORONARY ANGIOGRAPHY;  Surgeon: Leonie Man, MD;  Location: [place] [facility];  Service: Cardiovascular;  Laterality: N/A;  . REVISON OF ARTERIOVENOUS FISTULA Left 05/24/2020   Procedure: LEFT ARM ARTERIOVENOUS FISTULA  CONVERSION TO BASILIC VEIN FISTULA;  Surgeon: Waynetta Sandy, MD;  Location: [place];  Service: Vascular;  Laterality: Left;  . TOTAL HIP ARTHROPLASTY Left 10/11/2016   Procedure: LEFT TOTAL HIP ARTHROPLASTY ANTERIOR APPROACH;  Surgeon: Mcarthur Rossetti, MD;  Location: [facility];  Service: Orthopedics;  Laterality: Left;    Social History:  reports that he quit smoking about 10 months ago. His smoking use included cigarettes. He has a 6.00 pack-year smoking history. He has never used smokeless tobacco. He reports previous alcohol use. He reports that he does not use drugs.  Allergies:  Allergies  Allergen Reactions  . Crestor [Rosuvastatin] Rash    Also had a red eye    (Not in a hospital admission)   Prior to Admission medications   Medication Sig Start Date End Date Taking? Authorizing Provider  aspirin EC 81 MG tablet Take 1 tablet (81 mg total) by mouth daily. Swallow whole. 03/14/20  Yes  Lelon Perla, MD  bismuth subsalicylate (PEPTO BISMOL) 262 MG/15ML suspension Take 30 mLs by mouth every 6 (six) hours as needed for indigestion.   Yes [provider]  blood glucose meter kit and supplies Dispense based on patient and insurance preference. Use up to four times daily as directed. (FOR ICD-9 250.00, 250.01). 07/01/18  Yes Angiulli, Lavon Paganini, PA-C  cholecalciferol (VITAMIN D3) 25 MCG (1000 UNIT) tablet Take 1,000 Units by mouth Every Tuesday,Thursday,and Saturday with dialysis.    Yes [provider]  doxercalciferol (HECTOROL) 4 MCG/2ML injection Doxercalciferol (Hectorol) 07/22/20 07/21/21 Yes [provider]  Insulin Pen Needle 31G X 5 MM MISC Use daily to inject insulin as instructed 07/01/18   Yes Angiulli, Lavon Paganini, PA-C  iron sucrose (VENOFER) 20 MG/ML injection Iron Sucrose (Venofer) 09/14/20 10/05/20 Yes [provider]  LANTUS SOLOSTAR 100 UNIT/ML Solostar Pen Inject 25 Units into the skin in the morning, at noon, and at bedtime. 06/08/19  Yes [provider]  Methoxy PEG-Epoetin Beta (MIRCERA IJ) Mircera 08/31/20 08/30/21 Yes [provider]  pantoprazole (PROTONIX) 40 MG tablet Take 40 mg by mouth daily. 08/16/20  Yes [provider]  prednisoLONE acetate (PRED FORTE) 1 % ophthalmic suspension Place 1 drop into both eyes 3 (three) times daily. 08/16/20  Yes [provider]  VELPHORO 500 MG chewable tablet Chew 500 mg by mouth 3 (three) times daily with meals.  06/14/20  Yes [provider]  oxyCODONE-acetaminophen (PERCOCET) 5-325 MG tablet Take 1 tablet by mouth every 4 (four) hours as needed for severe pain. Patient not taking: No sig reported 07/19/20 07/19/21  Baglia, Corrina, PA-C    Blood pressure 134/72, pulse 76, temperature 99 F (37.2 C), resp. rate 16, SpO2 98 %. Physical Exam: General: pleasant, WD/WN male who is laying in bed in NAD HEENT: head is normocephalic, atraumatic.  Sclera are noninjected.  PERRL.  Ears and nose without any masses or lesions.  Mouth is pink and moist. Dentition fair Heart: regular, rate, and rhythm.  Normal s1,s2. No obvious murmurs, gallops, or rubs noted.  Palpable pedal pulses bilaterally  Lungs: CTAB, no wheezes, rhonchi, or rales noted.  Respiratory effort nonlabored Abd: soft, NT/ND, +BS, no masses, hernias, or organomegaly MS: s/p R BKA and L transmetatarsal amputation Skin: warm and dry with no masses, lesions, or rashes Psych: A&Ox4 with an appropriate affect Neuro: cranial nerves grossly intact, equal strength in BUE/BLE bilaterally, normal speech, thought process intact  Results for orders placed or performed during the hospital encounter of 09/26/20 (from the past 48 hour(s))   I-stat chem 8, ED     Status: Abnormal   Collection Time: 09/26/20  5:48 PM  Result Value Ref Range   Sodium 135 135 - 145 mmol/L   Potassium 4.6 3.5 - 5.1 mmol/L   Chloride 94 (L) 98 - 111 mmol/L   BUN 45 (H) 6 - 20 mg/dL   Creatinine, Ser 7.30 (H) 0.61 - 1.24 mg/dL   Glucose, Bld 321 (H) 70 - 99 mg/dL    Comment: Glucose reference range applies only to samples taken after fasting for at least 8 hours.   Calcium, Ion 0.99 (L) 1.15 - 1.40 mmol/L   TCO2 31 22 - 32 mmol/L   Hemoglobin 10.2 (L) 13.0 - 17.0 g/dL   HCT 30.0 (L) 39.0 - 52.0 %  Protime-INR     Status: Abnormal   Collection Time: 09/26/20  7:00 PM  Result Value Ref Range   Prothrombin  Time 18.3 (H) 11.4 - 15.2 seconds   INR 1.6 (H) 0.8 - 1.2    Comment: (NOTE) INR goal varies based on device and disease states. Performed at [facility], [place] [address], [place], [place] [address]   APTT     Status: None   Collection Time: 09/26/20  7:00 PM  Result Value Ref Range   aPTT 33 24 - 36 seconds    Comment: Performed at [facility] [address], [place], [place] [address]  CBC     Status: Abnormal   Collection Time: 09/26/20  7:00 PM  Result Value Ref Range   WBC 10.0 4.0 - 10.5 K/uL   RBC 3.53 (L) 4.22 - 5.81 MIL/uL   Hemoglobin 9.9 (L) 13.0 - 17.0 g/dL   HCT 31.6 (L) 39.0 - 52.0 %   MCV 89.5 80.0 - 100.0 fL   MCH 28.0 26.0 - 34.0 pg   MCHC 31.3 30.0 - 36.0 g/dL   RDW 15.7 (H) 11.5 - 15.5 %   Platelets 174 150 - 400 K/uL   nRBC 0.2 0.0 - 0.2 %    Comment: Performed at [facility] [address], [place], [place] [address]  Differential     Status: Abnormal   Collection Time: 09/26/20  7:00 PM  Result Value Ref Range   Neutrophils Relative % 81 %   Neutro Abs 8.3 (H) 1.7 - 7.7 K/uL   Lymphocytes Relative 9 %   Lymphs Abs 0.9 0.7 - 4.0 K/uL   Monocytes Relative 6 %   Monocytes Absolute 0.6 0.1 - 1.0 K/uL   Eosinophils Relative 2 %   Eosinophils Absolute 0.2 0.0 - 0.5 K/uL   Basophils  Relative 1 %   Basophils Absolute 0.1 0.0 - 0.1 K/uL   Immature Granulocytes 1 %   Abs Immature Granulocytes 0.08 (H) 0.00 - 0.07 K/uL    Comment: Performed at [facility] [address], [place], [place] [address]  Comprehensive metabolic panel     Status: Abnormal   Collection Time: 09/26/20  7:00 PM  Result Value Ref Range   Sodium 135 135 - 145 mmol/L   Potassium 4.6 3.5 - 5.1 mmol/L   Chloride 93 (L) 98 - 111 mmol/L   CO2 28 22 - 32 mmol/L   Glucose, Bld 306 (H) 70 - 99 mg/dL    Comment: Glucose reference range applies only to samples taken after fasting for at least 8 hours.   BUN 45 (H) 6 - 20 mg/dL   Creatinine, Ser 7.00 (H) 0.61 - 1.24 mg/dL   Calcium 8.4 (L) 8.9 - 10.3 mg/dL   Total Protein 6.8 6.5 - 8.1 g/dL   Albumin 3.3 (L) 3.5 - 5.0 g/dL   AST 807 (H) 15 - 41 U/L   ALT 1,154 (H) 0 - 44 U/L   Alkaline Phosphatase 106 38 - 126 U/L   Total Bilirubin 2.2 (H) 0.3 - 1.2 mg/dL   GFR, Estimated 9 (L) >60 mL/min    Comment: (NOTE) Calculated using the CKD-EPI Creatinine Equation (2021)    Anion gap 14 5 - 15    Comment: Performed at [facility], [place] [address] [place], [place] [address]  Lipase, blood     Status: None   Collection Time: 09/26/20  8:05 PM  Result Value Ref Range   Lipase 24 11 - 51 U/L    Comment: Performed at [facility]  [address], [place], [place] [address]  Urinalysis, Routine w reflex microscopic Urine, Clean Catch     Status: Abnormal   Collection Time: 09/26/20  8:23 PM  Result Value Ref Range   Color, Urine YELLOW YELLOW   APPearance CLEAR CLEAR   Specific Gravity, Urine 1.013 1.005 - 1.030   pH 9.0 (H) 5.0 - 8.0   Glucose, UA 50 (A) NEGATIVE mg/dL   Hgb urine dipstick SMALL (A) NEGATIVE   Bilirubin Urine NEGATIVE NEGATIVE   Ketones, ur NEGATIVE NEGATIVE mg/dL   Protein, ur >=300 (A) NEGATIVE mg/dL   Nitrite NEGATIVE NEGATIVE   Leukocytes,Ua NEGATIVE NEGATIVE   RBC / HPF 0-5 0 - 5 RBC/hpf   WBC, UA 0-5 0 - 5  WBC/hpf   Bacteria, UA RARE (A) NONE SEEN   Squamous Epithelial / LPF 0-5 0 - 5   Mucus PRESENT     Comment: Performed at [facility], [address] [place], [place] [address]  Resp Panel by RT-PCR (Flu A&B, Covid) Nasopharyngeal Swab     Status: None   Collection Time: 09/26/20  8:28 PM   Specimen: Nasopharyngeal Swab; Nasopharyngeal(NP) swabs in vial transport medium  Result Value Ref Range   SARS Coronavirus 2 by RT PCR NEGATIVE NEGATIVE    Comment: (NOTE) SARS-CoV-2 target nucleic acids are NOT DETECTED.  The SARS-CoV-2 RNA is generally detectable in upper respiratory specimens during the acute phase of infection. The lowest concentration of SARS-CoV-2 viral copies this assay can detect is 138 copies/mL. A negative result does not preclude SARS-Cov-2 infection and should not be used as the sole basis for treatment or other patient management decisions. A negative result may occur with  improper specimen collection/handling, submission of specimen other than nasopharyngeal swab, presence of viral mutation(s) within the areas targeted by this assay, and inadequate number of viral copies(<138 copies/mL). A negative result must be combined with clinical observations, patient history, and epidemiological information. The expected result is Negative.  Fact Sheet for Patients:  EntrepreneurPulse.com.au  Fact Sheet for Healthcare Providers:  IncredibleEmployment.be  This test is no t yet approved or cleared by the [place] FDA and  has been authorized for detection and/or diagnosis of SARS-CoV-2 by FDA under an Emergency Use Authorization (EUA). This EUA will remain  in effect (meaning this test can be used) for the duration of the COVID-19 declaration under Section 564(b)(1) of the Act, 21 U.S.C.section 360bbb-3(b)(1), unless the authorization is terminated  or revoked sooner.       Influenza A by PCR NEGATIVE NEGATIVE    Influenza B by PCR NEGATIVE NEGATIVE    Comment: (NOTE) The Xpert Xpress SARS-CoV-2/FLU/RSV plus assay is intended as an aid in the diagnosis of influenza from Nasopharyngeal swab specimens and should not be used as a sole basis for treatment. Nasal washings and aspirates are unacceptable for Xpert Xpress SARS-CoV-2/FLU/RSV testing.  Fact Sheet for Patients: EntrepreneurPulse.com.au  Fact Sheet for Healthcare Providers: IncredibleEmployment.be  This test is not yet approved or cleared by the [place] FDA and has been authorized for detection and/or diagnosis of SARS-CoV-2 by FDA under an Emergency Use Authorization (EUA). This EUA will remain in effect (meaning this test can be used) for the duration of the COVID-19 declaration under Section 564(b)(1) of the Act, 21 U.S.C. section 360bbb-3(b)(1), unless the authorization is terminated or revoked.  Performed at [facility], [place] [address], [place], [place] [address]   Hepatitis panel, acute     Status: None  Collection Time: 09/26/20 11:12 PM  Result Value Ref Range   Hepatitis B Surface Ag NON REACTIVE NON REACTIVE   HCV Ab NON REACTIVE NON REACTIVE    Comment: (NOTE) Nonreactive HCV antibody screen is consistent with no HCV infections,  unless recent infection is suspected or other evidence exists to indicate HCV infection.     Hep A IgM NON REACTIVE NON REACTIVE   Hep B C IgM NON REACTIVE NON REACTIVE    Comment: Performed at [facility], [place] [address], [place], [place] [address]  CBC     Status: Abnormal   Collection Time: 09/27/20  2:05 AM  Result Value Ref Range   WBC 8.0 4.0 - 10.5 K/uL   RBC 3.29 (L) 4.22 - 5.81 MIL/uL   Hemoglobin 9.1 (L) 13.0 - 17.0 g/dL   HCT 30.2 (L) 39.0 - 52.0 %   MCV 91.8 80.0 - 100.0 fL   MCH 27.7 26.0 - 34.0 pg   MCHC 30.1 30.0 - 36.0 g/dL   RDW 15.7 (H) 11.5 - 15.5 %   Platelets 162 150 - 400 K/uL   nRBC 0.3 (H) 0.0 - 0.2 %     Comment: Performed at [facility] [address] [place], [place] [address]  Creatinine, serum     Status: Abnormal   Collection Time: 09/27/20  2:05 AM  Result Value Ref Range   Creatinine, Ser 7.86 (H) 0.61 - 1.24 mg/dL   GFR, Estimated 8 (L) >60 mL/min    Comment: (NOTE) Calculated using the CKD-EPI Creatinine Equation (2021) Performed at [facility] [address] [place], [place] [address]   Hemoglobin A1c     Status: Abnormal   Collection Time: 09/27/20  2:05 AM  Result Value Ref Range   Hgb A1c MFr Bld 10.4 (H) 4.8 - 5.6 %    Comment: (NOTE) Pre diabetes:          5.7%-6.4%  Diabetes:              >6.4%  Glycemic control for   <7.0% adults with diabetes    Mean Plasma Glucose 251.78 mg/dL    Comment: Performed at [facility] [address], [place], [place] [address]  Comprehensive metabolic panel     Status: Abnormal   Collection Time: 09/27/20  4:52 AM  Result Value Ref Range   Sodium 136 135 - 145 mmol/L   Potassium 4.6 3.5 - 5.1 mmol/L   Chloride 96 (L) 98 - 111 mmol/L   CO2 22 22 - 32 mmol/L   Glucose, Bld 265 (H) 70 - 99 mg/dL    Comment: Glucose reference range applies only to samples taken after fasting for at least 8 hours.   BUN 57 (H) 6 - 20 mg/dL   Creatinine, Ser 8.06 (H) 0.61 - 1.24 mg/dL   Calcium 8.4 (L) 8.9 - 10.3 mg/dL   Total Protein 6.7 6.5 - 8.1 g/dL   Albumin 3.2 (L) 3.5 - 5.0 g/dL   AST 549 (H) 15 - 41 U/L   ALT 1,083 (H) 0 - 44 U/L   Alkaline Phosphatase 123 38 - 126 U/L   Total Bilirubin 1.7 (H) 0.3 - 1.2 mg/dL   GFR, Estimated 7 (L) >60 mL/min    Comment: (NOTE) Calculated using the CKD-EPI Creatinine Equation (2021)    Anion gap 18 (H) 5 - 15    Comment: Performed at [facility], [place],  [place] [address]  CBC     Status: Abnormal   Collection Time: 09/27/20  4:52 AM  Result Value Ref Range   WBC 7.4 4.0 - 10.5 K/uL   RBC 3.35 (L) 4.22 - 5.81 MIL/uL   Hemoglobin 9.8 (L) 13.0 -  17.0 g/dL   HCT 30.5 (L) 39.0 - 52.0 %   MCV 91.0 80.0 - 100.0 fL   MCH 29.3 26.0 - 34.0 pg   MCHC 32.1 30.0 - 36.0 g/dL   RDW 15.8 (H) 11.5 - 15.5 %   Platelets 183 150 - 400 K/uL   nRBC 0.4 (H) 0.0 - 0.2 %    Comment: Performed at [facility] [address] [place], [place] [address]  Hemoglobin A1c     Status: Abnormal   Collection Time: 09/27/20  5:00 AM  Result Value Ref Range   Hgb A1c MFr Bld 10.4 (H) 4.8 - 5.6 %    Comment: (NOTE) Pre diabetes:          5.7%-6.4%  Diabetes:              >6.4%  Glycemic control for   <7.0% adults with diabetes    Mean Plasma Glucose 251.78 mg/dL    Comment: Performed at [facility] [address] [place], [place] [address]  Lipid panel     Status: Abnormal   Collection Time: 09/27/20  5:00 AM  Result Value Ref Range   Cholesterol 132 0 - 200 mg/dL   Triglycerides 126 <150 mg/dL   HDL 26 (L) >40 mg/dL   Total CHOL/HDL Ratio 5.1 RATIO   VLDL 25 0 - 40 mg/dL   LDL Cholesterol 81 0 - 99 mg/dL    Comment:        Total Cholesterol/HDL:CHD Risk Coronary Heart Disease Risk Table                     Men   Women  1/2 Average Risk   3.4   3.3  Average Risk       5.0   4.4  2 X Average Risk   9.6   7.1  3 X Average Risk  23.4   11.0        Use the calculated Patient Ratio above and the CHD Risk Table to determine the patient's CHD Risk.        ATP III CLASSIFICATION (LDL):  <100     mg/dL   Optimal  100-129  mg/dL   Near or Above                    Optimal  130-159  mg/dL   Borderline  160-189  mg/dL   High  >190     mg/dL   Very High Performed at [facility] [address], [place], [place] [address]   CBG monitoring, ED     Status: Abnormal   Collection Time: 09/27/20  8:18 AM  Result Value Ref Range   Glucose-Capillary 297 (H) 70 - 99 mg/dL    Comment: Glucose reference range applies only to samples taken after fasting for at least 8 hours.   CT HEAD WO CONTRAST  Result Date: 09/26/2020 CLINICAL  DATA:  Transient neurologic deficit with decreased left hand grip strength lasting approximately 5 minutes EXAM: CT HEAD WITHOUT CONTRAST TECHNIQUE: Contiguous axial images were obtained from the base of the skull through the vertex without intravenous contrast. COMPARISON:  None. FINDINGS:  Brain: Hypoattenuating foci in the bilateral insula/external capsules may reflect sequela prior lacunar type infarcts, favor remote though are overall age indeterminate. No other sites of acute CT evident large vascular territory infarct. No evidence of acute hemorrhage, hydrocephalus, extra-axial collection, visible mass lesion or mass effect. Slight prominence of the ventricles, cisterns and sulci compatible with parenchymal volume loss. Vascular: Atherosclerotic calcification of the carotid siphons. No hyperdense vessel. Skull: No calvarial fracture or suspicious osseous lesion. No scalp swelling or hematoma. Sinuses/Orbits: Paranasal sinuses and mastoid air cells are predominantly clear. Orbital structures are unremarkable aside from prior lens extractions. Other: None IMPRESSION: 1. Hypoattenuating foci in the bilateral insula/external capsules may reflect sequela prior lacunar type infarcts, favor remote though are overall age indeterminate. If there is persisting concern for acute infarction, MRI is more sensitive and specific for early features of ischemia. 2. No other sites of acute CT evident large vascular territory or cortically based infarct. No other acute intracranial abnormality. 3. Intracranial atherosclerosis. Electronically Signed   By: Lovena Le M.D.   On: 09/26/2020 17:52   MR BRAIN WO CONTRAST  Result Date: 09/26/2020 CLINICAL DATA:  Weakness in the left hand. EXAM: MRI HEAD WITHOUT CONTRAST TECHNIQUE: Multiplanar, multiecho pulse sequences of the brain and surrounding structures were obtained without intravenous contrast. COMPARISON:  None. FINDINGS: Brain: No acute infarct, mass effect or extra-axial  collection. No acute or chronic hemorrhage. There is multifocal hyperintense T2-weighted signal within the white matter. Parenchymal volume and CSF spaces are normal. The midline structures are normal. Vascular: Major flow voids are preserved. Skull and upper cervical spine: Normal calvarium and skull base. Visualized upper cervical spine and soft tissues are normal. Sinuses/Orbits:No paranasal sinus fluid levels or advanced mucosal thickening. No mastoid or middle ear effusion. Normal orbits. IMPRESSION: 1. No acute intracranial abnormality. 2. Findings of mild chronic small vessel ischemic disease. Electronically Signed   By: Ulyses Jarred M.D.   On: 09/26/2020 23:10   US Abdomen Limited RUQ (LIVER/GB)  Result Date: 09/26/2020 CLINICAL DATA:  Right upper quadrant pain, discomfort EXAM: ULTRASOUND ABDOMEN LIMITED RIGHT UPPER QUADRANT COMPARISON:  CT 06/09/2020, renal ultrasound 07/12/2019 FINDINGS: Gallbladder: Hyperdense, shadowing biliary material likely reflecting an admixture of tiny stones and biliary sludge. Mild irregular gallbladder wall thickening without pericholecystic fluid. Sonographic Percell Miller sign is reportedly negative. Common bile duct: Diameter: 4.2 mm, nondilated Liver: Slightly lobular hepatic surface contour with mild heterogeneity of the hepatic parenchyma. No intrahepatic biliary ductal dilatation. No concerning focal hepatic lesion. Portal vein is patent on color Doppler imaging with normal direction of blood flow towards the liver. Other: None. IMPRESSION: 1. Hyperdense, shadowing biliary material likely reflecting an admixture of tiny stones and biliary sludge with gallbladder wall thickening, somewhat equivocal for cholecystitis given concomitant findings in liver and the absence of a sonographic Murphy sign though could reflect an early or developing acute cholecystitis in the appropriate clinical context. If further imaging evaluation is required, consider HIDA. 2. Slightly lobular  hepatic surface contour and parenchymal heterogeneity, can be seen in the setting of cirrhosis/intrinsic liver disease. Correlate with patient history and LFTs. Electronically Signed   By: Lovena Le M.D.   On: 09/26/2020 21:02   Anti-infectives (From admission, onward)   Start     Dose/Rate Route Frequency Ordered Stop   09/27/20 0745  cefTRIAXone (ROCEPHIN) 2 g in sodium chloride 0.9 % 100 mL IVPB        2 g 200 mL/hr over 30 Minutes Intravenous Every 24 hours 09/27/20 0744  Assessment/Plan ESRD on HD T/Th/Sat DM CHF CAD hx MI 07/2018 HLD HTN Hx L BKA, R transmetatarsal amputation  Nausea and vomiting Gallstones Elevated LFTs - Low suspicion for cholecystitis given no abdominal pain, no leukocytosis, and he was able to tolerate a diet yesterday without abdominal pain, n/v. He does not have a history of any biliary colic symptoms. Recommend GI consult for further work up of his elevated LFTs. He does not need any antibiotics from our standpoint. If he were to develop abdominal pain could consider HIDA scan to rule out acute cholecystitis.   ID - rocephin 2/9>> VTE - sq heparin FEN - renal/CM diet Foley - None Follow up - TBD  Wellington Hampshire, [facility] Surgery 09/27/2020, 8:31 AM Please see Amion for pager number during day hours 7:00am-4:30pm

## 2020-09-27 NOTE — Progress Notes (Signed)
PT eval and discharge  Pt admitted secondary to problem below with deficits below. Pt mod I for bed mobility and transfers and supervision to min guard for safety only during ambulation with RW. No LOB noted and pt reporting he feels he is at baseline. Educated about talking to biotech for toe filler for L shoe. Also educated about "BE FAST" acronym in recognizing CVA symptoms. No further skilled PT needs at this time. Will sign off. If needs change, please re-consult.    09/27/20 1250  PT Visit Information  Last PT Received On 09/27/20  Assistance Needed +1  PT/OT/SLP Co-Evaluation/Treatment Yes  Reason for Co-Treatment For patient/therapist safety  PT goals addressed during session Mobility/safety with mobility;Balance;Proper use of DME  History of Present Illness 50 y.o. male with medical history significant for anemia, anxiety, CHF, ESRD, CAD, R BKA, L transmet amputation, diabetes, GERD, hypertension, MI, who presents for evaluation of weakness and tingling in left hand which occurred. MRI negative.  Precautions  Precautions Fall  Required Braces or Orthoses Other Brace  Other Brace R prosthetic leg  Restrictions  Weight Bearing Restrictions No  Home Living  Family/patient expects to be discharged to: Private residence  Living Arrangements Children  Available Help at Discharge Family;Available 24 hours/day  Type of Home House  Home Access Stairs to enter  Entrance Stairs-Number of Steps 2  Entrance Stairs-Rails Right;Left  Home Layout One level  Bathroom Shower/Tub Walk-in Cytogeneticist Yes  [place] - single point;Shower seat;BSC;Walker - 2 wheels;Wheelchair - manual  Prior Function  Level of Independence Independent with assistive device(s);Needs assistance  Gait / Transfers Assistance Needed independent with use of cane; no recent falls  ADL's / [place] Needed independent with adl; A for IADL tasks   Communication  Communication No difficulties  Pain Assessment  Pain Assessment No/denies pain  Cognition  Arousal/Alertness Awake/alert  Behavior During Therapy WFL for tasks assessed/performed  Overall Cognitive Status Within Functional Limits for tasks assessed  Upper Extremity Assessment  Upper Extremity Assessment Defer to OT evaluation  Lower Extremity Assessment  Lower Extremity Assessment RLE deficits/detail;LLE deficits/detail  RLE Deficits / Details R BKA at baseline  LLE Deficits / Details L transmet amputation at baseline  Cervical / Trunk Assessment  Cervical / Trunk Assessment Normal  Bed Mobility  Overal bed mobility Modified Independent  Transfers  Overall transfer level Modified independent  Ambulation/Gait  Ambulation/Gait assistance Min guard;Supervision  Gait Distance (Feet) 40 Feet  Assistive device Rolling walker (2 wheeled)  Gait Pattern/deviations Step-through pattern;Decreased stride length  General Gait Details Ambulated laps in ED room as pt connected to monitory. Min guard to supervision for safety only. No physical assist required.  Gait velocity Decreased  Balance  Overall balance assessment Mild deficits observed, not formally tested  General Comments  General comments (skin integrity, edema, etc.) Educated about "Be Fast" acronym in recognizing CVA symptoms.  PT - End of Session  Equipment Utilized During Treatment Gait belt  Activity Tolerance Patient tolerated treatment well  Patient left in bed;with call bell/phone within reach (on stretcher in ED)  Nurse Communication Mobility status  PT Assessment  PT Recommendation/Assessment Patent does not need any further PT services  PT Visit Diagnosis Other symptoms and signs involving the nervous system (R29.898)  No Skilled PT All education completed;Patient at baseline level of functioning;Patient will have necessary level of assist by caregiver at discharge  AM-PAC PT "6 Clicks" Mobility Outcome  Measure (Version 2)  Help needed turning from your back to your side while in a flat bed without using bedrails? 4  Help needed moving from lying on your back to sitting on the side of a flat bed without using bedrails? 4  Help needed moving to and from a bed to a chair (including a wheelchair)? 4  Help needed standing up from a chair using your arms (e.g., wheelchair or bedside chair)? 4  Help needed to walk in hospital room? 3  Help needed climbing 3-5 steps with a railing?  3  6 Click Score 22  Consider Recommendation of Discharge To: Home with no services  PT Recommendation  Follow Up Recommendations No PT follow up  PT equipment None recommended by PT  Acute Rehab PT Goals  Patient Stated Goal to find out if his medication is causing his nausea  PT Goal Formulation With patient  Time For Goal Achievement 09/27/20  Potential to Achieve Goals Good  PT Time Calculation  PT Start Time (ACUTE ONLY) 1110  PT Stop Time (ACUTE ONLY) 1125  PT Time Calculation (min) (ACUTE ONLY) 15 min  PT General Charges  $$ ACUTE PT VISIT 1 Visit  PT Evaluation  $PT Eval Low Complexity 1 Low  Written Expression  Dominant Hand Right    Lou Miner, DPT  Acute Rehabilitation Services  Pager: [phone] Office: [phone]

## 2020-09-27 NOTE — ED Notes (Signed)
Lunch Tray Ordered @ 1046. 

## 2020-09-27 NOTE — Progress Notes (Signed)
[facility] OF CARE NOTE Patient: Edward [name] FKC:[id number]   PCP: [name], MD DOB: [birth year]   DOA: 09/26/2020   DOS: 09/27/2020    Patient was admitted by my colleague earlier on 09/27/2020. I have reviewed the H&P as well as assessment and plan and agree with the same. Important changes in the plan are listed below.  Plan of care: Principal Problem:   TIA (transient ischemic attack) Active Problems:   Diabetic polyneuropathy associated with type 2 diabetes mellitus ([place])   Anemia of chronic disease   Essential hypertension   Transaminitis   ESRD on hemodialysis (HCC)   Acute cholecystitis   Elevated LFTs CVA ruled out. Currently no symptoms. PT OT recommends no further therapy needed.  ESRD on TTS. Nephrology consulted.  Cholecystitis-ruled out. Elevated LFT. Tylenol level negative, ammonia level negative, INR improving.  No abdominal pain.  No Murphy sign. No antibiotics. Appreciate GI and General surgery recommendation.  Author: Berle Mull, MD [facility] 09/27/2020 3:14 PM   If 7PM-7AM, please contact night-coverage at [email or web address]

## 2020-09-27 NOTE — Consult Note (Incomplete)
[facility] Renal Consultation Note  Indication for Consultation:  Management of ESRD/hemodialysis; anemia, hypertension/volume and secondary hyperparathyroidism  HPI: Edward [name] is a 50 y.o. male ESRD HD  start at [facility] on 11/22/19 ho  T2DM, HTN, HFrEF (EF 45%), PAD (s/p R BKA, L TMA) - ACCESS: first stage AVF left 05/24/20, 2nd stage 07/19/20.      Past Medical History:  Diagnosis Date  . Anemia    getting Venofer with dialysis  . Anxiety    self reported, sometimes-situational anxiety  . Arthritis    bilateral hands/LEFT hip  . CHF (congestive heart failure) ([place])   . Chronic kidney disease    t th s dialysis- [place] ---ESRD  . Complication of anesthesia    slow to go to sleep  . Coronary artery disease   . Depression    self reported, sometimes-situational depression  . Diabetes mellitus    type II- on meds  . Dyspnea    sometimes- fluid  . GERD (gastroesophageal reflux disease)    on meds  . Hypertension    no longer on medication since starting on Hemodialysis  . Myocardial infarction [facility]) 2019    Past Surgical History:  Procedure Laterality Date  . A/V FISTULAGRAM Left 03/06/2020   Procedure: A/V FISTULAGRAM;  Surgeon: Waynetta Sandy, MD;  Location: [facility];  Service: Cardiovascular;  Laterality: Left;  . AMPUTATION Left 05/10/2016   Procedure: Left Transmetatarsal Amputation;  Surgeon: [name], MD;  Location: [place];  Service: Orthopedics;  Laterality: Left;  . AMPUTATION Right 05/16/2018   Procedure: PARTIAL RAY AMPUTATION FIFTH TOE RIGHT FOOT;  Surgeon: [name], DPM;  Location: [place];  Service: Podiatry;  Laterality: Right;  . AMPUTATION Right 06/17/2018   Procedure: AMPUTATION 4th RAY Right Foot;  Surgeon: Trula Slade, DPM;  Location: [place];  Service: Podiatry;  Laterality: Right;  . AMPUTATION Right 06/19/2018   Procedure: RIGHT BELOW KNEE AMPUTATION;  Surgeon: [name], MD;  Location: [place];   Service: Orthopedics;  Laterality: Right;  . APPLICATION OF WOUND VAC Right 06/19/2018   Procedure: APPLICATION OF WOUND VAC;  Surgeon: [name], MD;  Location: [place];  Service: Orthopedics;  Laterality: Right;  . AV FISTULA PLACEMENT Left 11/25/2019   Procedure: LEFT ARM Brachiocephalic  ARTERIOVENOUS (AV) FISTULA CREATION;  Surgeon: Rosetta Posner, MD;  Location: [place];  Service: Vascular;  Laterality: Left;  . BASCILIC VEIN TRANSPOSITION Left 07/19/2020   Procedure: LEFT SECOND STAGE BASCILIC VEIN TRANSPOSITION;  Surgeon: Waynetta Sandy, MD;  Location: [place];  Service: Vascular;  Laterality: Left;  . CARDIAC CATHETERIZATION  07/29/2018  . CIRCUMCISION  09/02/2011   Procedure: CIRCUMCISION ADULT;  Surgeon: Molli Hazard, MD;  Location: [place];  Service: Urology;  Laterality: N/A;  . CORONARY STENT INTERVENTION N/A 07/29/2018   Procedure: CORONARY STENT INTERVENTION;  Surgeon: Leonie Man, MD;  Location: [place] [facility];  Service: Cardiovascular;  Laterality: N/A;  . I & D EXTREMITY  09/02/2011   Procedure: IRRIGATION AND DEBRIDEMENT EXTREMITY;  Surgeon: Theodoro Kos, DO;  Location: [place];  Service: Plastics;  Laterality: N/A;  . INCISION AND DRAINAGE OF WOUND  08/12/2011   Procedure: IRRIGATION AND DEBRIDEMENT WOUND;  Surgeon: Zenovia Jarred, MD;  Location: [place];  Service: General;;  perineum  . INSERTION OF DIALYSIS CATHETER N/A 11/25/2019   Procedure: INSERTION OF Righ Internal Jugular DIALYSIS CATHETER;  Surgeon: Rosetta Posner, MD;  Location: [facility];  Service: Vascular;  Laterality: N/A;  . IRRIGATION AND DEBRIDEMENT ABSCESS  08/12/2011   Procedure: IRRIGATION AND DEBRIDEMENT ABSCESS;  Surgeon: Molli Hazard, MD;  Location: [place];  Service: Urology;  Laterality: N/A;  irrigation and debridment perineum    . IRRIGATION AND DEBRIDEMENT ABSCESS  08/14/2011   Procedure: MINOR INCISION AND DRAINAGE OF ABSCESS;  Surgeon:  Molli Hazard, MD;  Location: [place];  Service: Urology;  Laterality: N/A;  Perineal Wound Debridement;Placement Bilateral Testicular Thigh Pouches  . LEFT HEART CATH AND CORONARY ANGIOGRAPHY N/A 07/29/2018   Procedure: LEFT HEART CATH AND CORONARY ANGIOGRAPHY;  Surgeon: Leonie Man, MD;  Location: [place] [facility];  Service: Cardiovascular;  Laterality: N/A;  . REVISON OF ARTERIOVENOUS FISTULA Left 05/24/2020   Procedure: LEFT ARM ARTERIOVENOUS FISTULA  CONVERSION TO BASILIC VEIN FISTULA;  Surgeon: Waynetta Sandy, MD;  Location: [place];  Service: Vascular;  Laterality: Left;  . TOTAL HIP ARTHROPLASTY Left 10/11/2016   Procedure: LEFT TOTAL HIP ARTHROPLASTY ANTERIOR APPROACH;  Surgeon: Mcarthur Rossetti, MD;  Location: [facility];  Service: Orthopedics;  Laterality: Left;      Family History  Problem Relation Age of Onset  . Diabetes Other   . Pancreatic cancer Mother 33  . Colon cancer Neg Hx   . Colon polyps Neg Hx   . Esophageal cancer Neg Hx   . Rectal cancer Neg Hx   . Stomach cancer Neg Hx       reports that he quit smoking about 10 months ago. His smoking use included cigarettes. He has a 6.00 pack-year smoking history. He has never used smokeless tobacco. He reports previous alcohol use. He reports that he does not use drugs.   Allergies  Allergen Reactions  . Crestor [Rosuvastatin] Rash    Also had a red eye    Prior to Admission medications   Medication Sig Start Date End Date Taking? Authorizing Provider  aspirin EC 81 MG tablet Take 1 tablet (81 mg total) by mouth daily. Swallow whole. 03/14/20  Yes Lelon Perla, MD  bismuth subsalicylate (PEPTO BISMOL) 262 MG/15ML suspension Take 30 mLs by mouth every 6 (six) hours as needed for indigestion.   Yes [provider]  blood glucose meter kit and supplies Dispense based on patient and insurance preference. Use up to four times daily as directed. (FOR ICD-9 250.00, 250.01). 07/01/18  Yes  Angiulli, Lavon Paganini, PA-C  cholecalciferol (VITAMIN D3) 25 MCG (1000 UNIT) tablet Take 1,000 Units by mouth Every Tuesday,Thursday,and Saturday with dialysis.    Yes [provider]  doxercalciferol (HECTOROL) 4 MCG/2ML injection Doxercalciferol (Hectorol) 07/22/20 07/21/21 Yes [provider]  Insulin Pen Needle 31G X 5 MM MISC Use daily to inject insulin as instructed 07/01/18  Yes Angiulli, Lavon Paganini, PA-C  iron sucrose (VENOFER) 20 MG/ML injection Iron Sucrose (Venofer) 09/14/20 10/05/20 Yes [provider]  LANTUS SOLOSTAR 100 UNIT/ML Solostar Pen Inject 25 Units into the skin in the morning, at noon, and at bedtime. 06/08/19  Yes [provider]  Methoxy PEG-Epoetin Beta (MIRCERA IJ) Mircera 08/31/20 08/30/21 Yes [provider]  pantoprazole (PROTONIX) 40 MG tablet Take 40 mg by mouth daily. 08/16/20  Yes [provider]  prednisoLONE acetate (PRED FORTE) 1 % ophthalmic suspension Place 1 drop into both eyes 3 (three) times daily. 08/16/20  Yes [provider]  VELPHORO 500 MG chewable tablet Chew 500 mg by mouth  3 (three) times daily with meals.  06/14/20  Yes [provider]  oxyCODONE-acetaminophen (PERCOCET) 5-325 MG tablet Take 1 tablet by mouth every 4 (four) hours as needed for severe pain. Patient not taking: No sig reported 07/19/20 07/19/21  Karoline Caldwell, PA-C    {medication reviewed/display:[id number]}  Results for orders placed or performed during the hospital encounter of 09/26/20 (from the past 48 hour(s))  I-stat chem 8, ED     Status: Abnormal   Collection Time: 09/26/20  5:48 PM  Result Value Ref Range   Sodium 135 135 - 145 mmol/L   Potassium 4.6 3.5 - 5.1 mmol/L   Chloride 94 (L) 98 - 111 mmol/L   BUN 45 (H) 6 - 20 mg/dL   Creatinine, Ser 7.30 (H) 0.61 - 1.24 mg/dL   Glucose, Bld 321 (H) 70 - 99 mg/dL    Comment: Glucose reference range applies only to samples taken after fasting for at least 8 hours.    Calcium, Ion 0.99 (L) 1.15 - 1.40 mmol/L   TCO2 31 22 - 32 mmol/L   Hemoglobin 10.2 (L) 13.0 - 17.0 g/dL   HCT 30.0 (L) 39.0 - 52.0 %  Protime-INR     Status: Abnormal   Collection Time: 09/26/20  7:00 PM  Result Value Ref Range   Prothrombin Time 18.3 (H) 11.4 - 15.2 seconds   INR 1.6 (H) 0.8 - 1.2    Comment: (NOTE) INR goal varies based on device and disease states. Performed at [facility], [place] [address], [place], [place] [address]   APTT     Status: None   Collection Time: 09/26/20  7:00 PM  Result Value Ref Range   aPTT 33 24 - 36 seconds    Comment: Performed at [facility] [address] [place], [place] [address]  CBC     Status: Abnormal   Collection Time: 09/26/20  7:00 PM  Result Value Ref Range   WBC 10.0 4.0 - 10.5 K/uL   RBC 3.53 (L) 4.22 - 5.81 MIL/uL   Hemoglobin 9.9 (L) 13.0 - 17.0 g/dL   HCT 31.6 (L) 39.0 - 52.0 %   MCV 89.5 80.0 - 100.0 fL   MCH 28.0 26.0 - 34.0 pg   MCHC 31.3 30.0 - 36.0 g/dL   RDW 15.7 (H) 11.5 - 15.5 %   Platelets 174 150 - 400 K/uL   nRBC 0.2 0.0 - 0.2 %    Comment: Performed at [facility] [address] [place], [place] [address]  Differential     Status: Abnormal   Collection Time: 09/26/20  7:00 PM  Result Value Ref Range   Neutrophils Relative % 81 %   Neutro Abs 8.3 (H) 1.7 - 7.7 K/uL   Lymphocytes Relative 9 %   Lymphs Abs 0.9 0.7 - 4.0 K/uL   Monocytes Relative 6 %   Monocytes Absolute 0.6 0.1 - 1.0 K/uL   Eosinophils Relative 2 %   Eosinophils Absolute 0.2 0.0 - 0.5 K/uL   Basophils Relative 1 %   Basophils Absolute 0.1 0.0 - 0.1 K/uL   Immature Granulocytes 1 %   Abs Immature Granulocytes 0.08 (H) 0.00 - 0.07 K/uL    Comment: Performed at [facility] [address], [place], [place] [address]  Comprehensive metabolic panel     Status: Abnormal   Collection Time: 09/26/20  7:00 PM  Result Value Ref Range   Sodium 135 135 - 145 mmol/L  Potassium 4.6 3.5 - 5.1 mmol/L   Chloride 93  (L) 98 - 111 mmol/L   CO2 28 22 - 32 mmol/L   Glucose, Bld 306 (H) 70 - 99 mg/dL    Comment: Glucose reference range applies only to samples taken after fasting for at least 8 hours.   BUN 45 (H) 6 - 20 mg/dL   Creatinine, Ser 7.00 (H) 0.61 - 1.24 mg/dL   Calcium 8.4 (L) 8.9 - 10.3 mg/dL   Total Protein 6.8 6.5 - 8.1 g/dL   Albumin 3.3 (L) 3.5 - 5.0 g/dL   AST 807 (H) 15 - 41 U/L   ALT 1,154 (H) 0 - 44 U/L   Alkaline Phosphatase 106 38 - 126 U/L   Total Bilirubin 2.2 (H) 0.3 - 1.2 mg/dL   GFR, Estimated 9 (L) >60 mL/min    Comment: (NOTE) Calculated using the CKD-EPI Creatinine Equation (2021)    Anion gap 14 5 - 15    Comment: Performed at [facility], [place] [address], [place], [place] [address]  Lipase, blood     Status: None   Collection Time: 09/26/20  8:05 PM  Result Value Ref Range   Lipase 24 11 - 51 U/L    Comment: Performed at [facility] [address] [place], [place] [address]  Urinalysis, Routine w reflex microscopic Urine, Clean Catch     Status: Abnormal   Collection Time: 09/26/20  8:23 PM  Result Value Ref Range   Color, Urine YELLOW YELLOW   APPearance CLEAR CLEAR   Specific Gravity, Urine 1.013 1.005 - 1.030   pH 9.0 (H) 5.0 - 8.0   Glucose, UA 50 (A) NEGATIVE mg/dL   Hgb urine dipstick SMALL (A) NEGATIVE   Bilirubin Urine NEGATIVE NEGATIVE   Ketones, ur NEGATIVE NEGATIVE mg/dL   Protein, ur >=300 (A) NEGATIVE mg/dL   Nitrite NEGATIVE NEGATIVE   Leukocytes,Ua NEGATIVE NEGATIVE   RBC / HPF 0-5 0 - 5 RBC/hpf   WBC, UA 0-5 0 - 5 WBC/hpf   Bacteria, UA RARE (A) NONE SEEN   Squamous Epithelial / LPF 0-5 0 - 5   Mucus PRESENT     Comment: Performed at [facility], [address] [place], [place] [address]  Resp Panel by RT-PCR (Flu A&B, Covid) Nasopharyngeal Swab     Status: None   Collection Time: 09/26/20  8:28 PM   Specimen: Nasopharyngeal Swab; Nasopharyngeal(NP) swabs in vial transport medium  Result Value Ref Range   SARS  Coronavirus 2 by RT PCR NEGATIVE NEGATIVE    Comment: (NOTE) SARS-CoV-2 target nucleic acids are NOT DETECTED.  The SARS-CoV-2 RNA is generally detectable in upper respiratory specimens during the acute phase of infection. The lowest concentration of SARS-CoV-2 viral copies this assay can detect is 138 copies/mL. A negative result does not preclude SARS-Cov-2 infection and should not be used as the sole basis for treatment or other patient management decisions. A negative result may occur with  improper specimen collection/handling, submission of specimen other than nasopharyngeal swab, presence of viral mutation(s) within the areas targeted by this assay, and inadequate number of viral copies(<138 copies/mL). A negative result must be combined with clinical observations, patient history, and epidemiological information. The expected result is Negative.  Fact Sheet for Patients:  EntrepreneurPulse.com.au  Fact Sheet for Healthcare Providers:  IncredibleEmployment.be  This test is no t yet approved or cleared by the [place] FDA and  has been authorized for detection  and/or diagnosis of SARS-CoV-2 by FDA under an Emergency Use Authorization (EUA). This EUA will remain  in effect (meaning this test can be used) for the duration of the COVID-19 declaration under Section 564(b)(1) of the Act, 21 U.S.C.section 360bbb-3(b)(1), unless the authorization is terminated  or revoked sooner.       Influenza A by PCR NEGATIVE NEGATIVE   Influenza B by PCR NEGATIVE NEGATIVE    Comment: (NOTE) The Xpert Xpress SARS-CoV-2/FLU/RSV plus assay is intended as an aid in the diagnosis of influenza from Nasopharyngeal swab specimens and should not be used as a sole basis for treatment. Nasal washings and aspirates are unacceptable for Xpert Xpress SARS-CoV-2/FLU/RSV testing.  Fact Sheet for Patients: EntrepreneurPulse.com.au  Fact Sheet  for Healthcare Providers: IncredibleEmployment.be  This test is not yet approved or cleared by the [place] FDA and has been authorized for detection and/or diagnosis of SARS-CoV-2 by FDA under an Emergency Use Authorization (EUA). This EUA will remain in effect (meaning this test can be used) for the duration of the COVID-19 declaration under Section 564(b)(1) of the Act, 21 U.S.C. section 360bbb-3(b)(1), unless the authorization is terminated or revoked.  Performed at [facility], [place] [address], [place], [place] [address]   Hepatitis panel, acute     Status: None   Collection Time: 09/26/20 11:12 PM  Result Value Ref Range   Hepatitis B Surface Ag NON REACTIVE NON REACTIVE   HCV Ab NON REACTIVE NON REACTIVE    Comment: (NOTE) Nonreactive HCV antibody screen is consistent with no HCV infections,  unless recent infection is suspected or other evidence exists to indicate HCV infection.     Hep A IgM NON REACTIVE NON REACTIVE   Hep B C IgM NON REACTIVE NON REACTIVE    Comment: Performed at [facility], [place] [address], [place], [place] [address]  CBC     Status: Abnormal   Collection Time: 09/27/20  2:05 AM  Result Value Ref Range   WBC 8.0 4.0 - 10.5 K/uL   RBC 3.29 (L) 4.22 - 5.81 MIL/uL   Hemoglobin 9.1 (L) 13.0 - 17.0 g/dL   HCT 30.2 (L) 39.0 - 52.0 %   MCV 91.8 80.0 - 100.0 fL   MCH 27.7 26.0 - 34.0 pg   MCHC 30.1 30.0 - 36.0 g/dL   RDW 15.7 (H) 11.5 - 15.5 %   Platelets 162 150 - 400 K/uL   nRBC 0.3 (H) 0.0 - 0.2 %    Comment: Performed at [facility] [address], [place], [place] [address]  Creatinine, serum     Status: Abnormal   Collection Time: 09/27/20  2:05 AM  Result Value Ref Range   Creatinine, Ser 7.86 (H) 0.61 - 1.24 mg/dL   GFR, Estimated 8 (L) >60 mL/min    Comment: (NOTE) Calculated using the CKD-EPI Creatinine Equation (2021) Performed at [facility] [address] [place],  [place] [address]   Hemoglobin A1c     Status: Abnormal   Collection Time: 09/27/20  2:05 AM  Result Value Ref Range   Hgb A1c MFr Bld 10.4 (H) 4.8 - 5.6 %    Comment: (NOTE) Pre diabetes:          5.7%-6.4%  Diabetes:              >6.4%  Glycemic control for   <7.0% adults with diabetes    Mean Plasma Glucose 251.78 mg/dL    Comment:  Performed at [facility], [place] [address], [place], [place] [address]  Comprehensive metabolic panel     Status: Abnormal   Collection Time: 09/27/20  4:52 AM  Result Value Ref Range   Sodium 136 135 - 145 mmol/L   Potassium 4.6 3.5 - 5.1 mmol/L   Chloride 96 (L) 98 - 111 mmol/L   CO2 22 22 - 32 mmol/L   Glucose, Bld 265 (H) 70 - 99 mg/dL    Comment: Glucose reference range applies only to samples taken after fasting for at least 8 hours.   BUN 57 (H) 6 - 20 mg/dL   Creatinine, Ser 8.06 (H) 0.61 - 1.24 mg/dL   Calcium 8.4 (L) 8.9 - 10.3 mg/dL   Total Protein 6.7 6.5 - 8.1 g/dL   Albumin 3.2 (L) 3.5 - 5.0 g/dL   AST 549 (H) 15 - 41 U/L   ALT 1,083 (H) 0 - 44 U/L   Alkaline Phosphatase 123 38 - 126 U/L   Total Bilirubin 1.7 (H) 0.3 - 1.2 mg/dL   GFR, Estimated 7 (L) >60 mL/min    Comment: (NOTE) Calculated using the CKD-EPI Creatinine Equation (2021)    Anion gap 18 (H) 5 - 15    Comment: Performed at [facility], [place] [address], [place], [place] [address]  CBC     Status: Abnormal   Collection Time: 09/27/20  4:52 AM  Result Value Ref Range   WBC 7.4 4.0 - 10.5 K/uL   RBC 3.35 (L) 4.22 - 5.81 MIL/uL   Hemoglobin 9.8 (L) 13.0 - 17.0 g/dL   HCT 30.5 (L) 39.0 - 52.0 %   MCV 91.0 80.0 - 100.0 fL   MCH 29.3 26.0 - 34.0 pg   MCHC 32.1 30.0 - 36.0 g/dL   RDW 15.8 (H) 11.5 - 15.5 %   Platelets 183 150 - 400 K/uL   nRBC 0.4 (H) 0.0 - 0.2 %    Comment: Performed at [facility] [address], [place], [place] [address]  Hemoglobin A1c     Status: Abnormal   Collection Time: 09/27/20  5:00 AM  Result Value Ref Range   Hgb A1c  MFr Bld 10.4 (H) 4.8 - 5.6 %    Comment: (NOTE) Pre diabetes:          5.7%-6.4%  Diabetes:              >6.4%  Glycemic control for   <7.0% adults with diabetes    Mean Plasma Glucose 251.78 mg/dL    Comment: Performed at [facility] [address] [place],  [address]  Lipid panel     Status: Abnormal   Collection Time: 09/27/20  5:00 AM  Result Value Ref Range   Cholesterol 132 0 - 200 mg/dL   Triglycerides 126 <150 mg/dL   HDL 26 (L) >40 mg/dL   Total CHOL/HDL Ratio 5.1 RATIO   VLDL 25 0 - 40 mg/dL   LDL Cholesterol 81 0 - 99 mg/dL    Comment:        Total Cholesterol/HDL:CHD Risk Coronary Heart Disease Risk Table                     Men   Women  1/2 Average Risk   3.4   3.3  Average Risk       5.0   4.4  2 X Average Risk   9.6   7.1  3  X Average Risk  23.4   11.0        Use the calculated Patient Ratio above and the CHD Risk Table to determine the patient's CHD Risk.        ATP III CLASSIFICATION (LDL):  <100     mg/dL   Optimal  100-129  mg/dL   Near or Above                    Optimal  130-159  mg/dL   Borderline  160-189  mg/dL   High  >190     mg/dL   Very High Performed at [facility] [address] [place], [place] [address]   CBG monitoring, ED     Status: Abnormal   Collection Time: 09/27/20  8:18 AM  Result Value Ref Range   Glucose-Capillary 297 (H) 70 - 99 mg/dL    Comment: Glucose reference range applies only to samples taken after fasting for at least 8 hours.  Protime-INR     Status: Abnormal   Collection Time: 09/27/20  1:45 PM  Result Value Ref Range   Prothrombin Time 15.8 (H) 11.4 - 15.2 seconds   INR 1.3 (H) 0.8 - 1.2    Comment: (NOTE) INR goal varies based on device and disease states. Performed at [facility], [place] [address] [place], [place] [address]   Ethanol     Status: None   Collection Time: 09/27/20  1:45 PM  Result Value Ref Range   Alcohol, Ethyl (B) <10 <10 mg/dL    Comment: (NOTE) Lowest  detectable limit for serum alcohol is 10 mg/dL.  For medical purposes only. Performed at [facility], [place] [address], [place], [place] [address]   Ammonia     Status: None   Collection Time: 09/27/20  1:45 PM  Result Value Ref Range   Ammonia 25 9 - 35 umol/L    Comment: Performed at [facility], [place] [address] [place], [place] [address]  Acetaminophen level     Status: Abnormal   Collection Time: 09/27/20  1:45 PM  Result Value Ref Range   Acetaminophen (Tylenol), Serum <10 (L) 10 - 30 ug/mL    Comment: (NOTE) Therapeutic concentrations vary significantly. A range of 10-30 ug/mL  may be an effective concentration for many patients. However, some  are best treated at concentrations outside of this range. Acetaminophen concentrations >150 ug/mL at 4 hours after ingestion  and >50 ug/mL at 12 hours after ingestion are often associated with  toxic reactions.  Performed at [facility], [place] [address], [place], [place] [address]   CBG monitoring, ED     Status: Abnormal   Collection Time: 09/27/20  2:34 PM  Result Value Ref Range   Glucose-Capillary 247 (H) 70 - 99 mg/dL    Comment: Glucose reference range applies only to samples taken after fasting for at least 8 hours.   EKG: {ekg findings:315101::"normal EKG, normal sinus rhythm","unchanged from previous tracings"}.  ROS:  {ros master:310782}  Physical Exam: Vitals:   09/27/20 1456 09/27/20 1520  BP: (!) 157/95 (!) 142/67  Pulse: 78 78  Resp: 18 18  Temp: 98 F (36.7 C) 98.2 F (36.8 C)  SpO2: 96% 100%     General: *** HEENT: *** Eyes: *** Neck: *** Heart: *** Lungs: *** Abdomen: *** Extremities: *** Skin: *** Neuro: *** Dialysis Access: ***  Dialysis Orders: Center: ***  on *** . EDW ***  HD Bath ***  Time *** Heparin ***. Access *** BFR *** DFR ***    Zemplar *** mcg IV/HD Epogen ***   Units IV/HD  Venofer  ***  Other ***  Assessment/Plan 1. *** 2. ESRD -   *** 3. Hypertension/volume  - *** 4. Anemia  - *** 5. Metabolic bone disease -  *** 6. Nutrition - ***  Ernest Haber, PA-C [place] [phone] 09/27/2020, 4:00 PM

## 2020-09-27 NOTE — Progress Notes (Signed)
Report taken from [facility] ED RN, room not clean yet, will call her back when ready

## 2020-09-27 NOTE — H&P (Signed)
History and Physical    Edward [name] NUU:[id number] DOB: 12-23-1970 DOA: 09/26/2020  PCP: [name], MD   Patient coming from: Home  Chief Complaint:  Left hand numbness and weakness yesterday, nausea and vomiting  HPI: Edward [name] is a 50 y.o. male with medical history significant for anemia, anxiety, CHF, ESRD, CAD, diabetes, GERD, hypertension, MI, who presents for evaluation of weakness and tingling in left hand which occurred yesterday.  Patient is concerned that he had a stroke yesterday.  He states that he was having trouble holding an glass of drink yesterday with his left hand.  This lasted for approximately 5 minutes and resolved on its own.  He also had some left upper extremity numbness at that time.  He denies noticing any left lower extremity weakness/numbness.  He denies any facial droop, vision changes or aphasia. He denies headache. No LOC or head trauma.  He further notes that yesterday he had multiple episodes of vomiting and was having abdominal bloating and pain in right upper abdomen as well.  He denies any fevers.  He reports he had some hematuria yesterday.  He denies any dysuria or frequency.  He has dialysis on Tu, Th and Sat and had his normal dialysis session on 09/26/2020  Review of Systems:  General: Denies fever, chills, weight loss, night sweats.  Denies dizziness.  Denies change in appetite HENT: Denies head trauma, headache, denies change in hearing, tinnitus.  Denies nasal congestion or bleeding.  Denies sore throat, sores in mouth.  Denies difficulty swallowing Eyes: Denies blurry vision, pain in eye, drainage.  Denies discoloration of eyes. Neck: Denies pain.  Denies swelling.  Denies pain with movement. Cardiovascular: Denies chest pain, palpitations.  Denies edema.  Denies orthopnea Respiratory: Denies shortness of breath, cough.  Denies wheezing.  Denies sputum production Gastrointestinal: Reports right upper abdominal pain. Reports nausea, vomiting.  Denies diarrhea.  Denies melena.  Denies hematemesis. Musculoskeletal: Denies limitation of movement.  Denies deformity or swelling.  Denies pain.  Denies arthralgias or myalgias. Genitourinary: Denies pelvic pain.  Denies urinary frequency or hesitancy.  Denies dysuria.  Skin: Denies rash.  Denies petechiae, purpura, ecchymosis. Neurological: Denies headache.  Denies syncope.  Denies seizure activity. Reports weakness and paresthesia left hand that has resolved.  Denies slurred speech, drooping face.  Denies visual change. Psychiatric: Denies depression, anxiety.  Denies suicidal thoughts or ideation.  Denies hallucinations.  Past Medical History:  Diagnosis Date  . Anemia    getting Venofer with dialysis  . Anxiety    self reported, sometimes-situational anxiety  . Arthritis    bilateral hands/LEFT hip  . CHF (congestive heart failure) ([place])   . Chronic kidney disease    t th s dialysis- [place] ---ESRD  . Complication of anesthesia    slow to go to sleep  . Coronary artery disease   . Depression    self reported, sometimes-situational depression  . Diabetes mellitus    type II- on meds  . Dyspnea    sometimes- fluid  . GERD (gastroesophageal reflux disease)    on meds  . Hypertension    no longer on medication since starting on Hemodialysis  . Myocardial infarction [facility]) 2019    Past Surgical History:  Procedure Laterality Date  . A/V FISTULAGRAM Left 03/06/2020   Procedure: A/V FISTULAGRAM;  Surgeon: Waynetta Sandy, MD;  Location: [place] [facility];  Service: Cardiovascular;  Laterality: Left;  . AMPUTATION Left 05/10/2016   Procedure: Left Transmetatarsal Amputation;  Surgeon: [name], MD;  Location: [place];  Service: Orthopedics;  Laterality: Left;  . AMPUTATION Right 05/16/2018   Procedure: PARTIAL RAY AMPUTATION FIFTH TOE RIGHT FOOT;  Surgeon: [name], DPM;  Location: [place];  Service: Podiatry;  Laterality: Right;  . AMPUTATION Right 06/17/2018    Procedure: AMPUTATION 4th RAY Right Foot;  Surgeon: Trula Slade, DPM;  Location: [place];  Service: Podiatry;  Laterality: Right;  . AMPUTATION Right 06/19/2018   Procedure: RIGHT BELOW KNEE AMPUTATION;  Surgeon: [name], MD;  Location: [place];  Service: Orthopedics;  Laterality: Right;  . APPLICATION OF WOUND VAC Right 06/19/2018   Procedure: APPLICATION OF WOUND VAC;  Surgeon: [name], MD;  Location: [place];  Service: Orthopedics;  Laterality: Right;  . AV FISTULA PLACEMENT Left 11/25/2019   Procedure: LEFT ARM Brachiocephalic  ARTERIOVENOUS (AV) FISTULA CREATION;  Surgeon: Rosetta Posner, MD;  Location: [place];  Service: Vascular;  Laterality: Left;  . BASCILIC VEIN TRANSPOSITION Left 07/19/2020   Procedure: LEFT SECOND STAGE BASCILIC VEIN TRANSPOSITION;  Surgeon: Waynetta Sandy, MD;  Location: [place];  Service: Vascular;  Laterality: Left;  . CARDIAC CATHETERIZATION  07/29/2018  . CIRCUMCISION  09/02/2011   Procedure: CIRCUMCISION ADULT;  Surgeon: Molli Hazard, MD;  Location: [place];  Service: Urology;  Laterality: N/A;  . CORONARY STENT INTERVENTION N/A 07/29/2018   Procedure: CORONARY STENT INTERVENTION;  Surgeon: Leonie Man, MD;  Location: [place] [facility];  Service: Cardiovascular;  Laterality: N/A;  . I & D EXTREMITY  09/02/2011   Procedure: IRRIGATION AND DEBRIDEMENT EXTREMITY;  Surgeon: Theodoro Kos, DO;  Location: [place];  Service: Plastics;  Laterality: N/A;  . INCISION AND DRAINAGE OF WOUND  08/12/2011   Procedure: IRRIGATION AND DEBRIDEMENT WOUND;  Surgeon: Zenovia Jarred, MD;  Location: [place];  Service: General;;  perineum  . INSERTION OF DIALYSIS CATHETER N/A 11/25/2019   Procedure: INSERTION OF Righ Internal Jugular DIALYSIS CATHETER;  Surgeon: Rosetta Posner, MD;  Location: [place];  Service: Vascular;  Laterality: N/A;  . IRRIGATION AND DEBRIDEMENT ABSCESS  08/12/2011   Procedure: IRRIGATION AND  DEBRIDEMENT ABSCESS;  Surgeon: Molli Hazard, MD;  Location: [place];  Service: Urology;  Laterality: N/A;  irrigation and debridment perineum    . IRRIGATION AND DEBRIDEMENT ABSCESS  08/14/2011   Procedure: MINOR INCISION AND DRAINAGE OF ABSCESS;  Surgeon: Molli Hazard, MD;  Location: [place];  Service: Urology;  Laterality: N/A;  Perineal Wound Debridement;Placement Bilateral Testicular Thigh Pouches  . LEFT HEART CATH AND CORONARY ANGIOGRAPHY N/A 07/29/2018   Procedure: LEFT HEART CATH AND CORONARY ANGIOGRAPHY;  Surgeon: Leonie Man, MD;  Location: [place] [facility];  Service: Cardiovascular;  Laterality: N/A;  . REVISON OF ARTERIOVENOUS FISTULA Left 05/24/2020   Procedure: LEFT ARM ARTERIOVENOUS FISTULA  CONVERSION TO BASILIC VEIN FISTULA;  Surgeon: Waynetta Sandy, MD;  Location: [place];  Service: Vascular;  Laterality: Left;  . TOTAL HIP ARTHROPLASTY Left 10/11/2016   Procedure: LEFT TOTAL HIP ARTHROPLASTY ANTERIOR APPROACH;  Surgeon: Mcarthur Rossetti, MD;  Location: [facility];  Service: Orthopedics;  Laterality: Left;    Social History  reports that he quit smoking about 10 months ago. His smoking use included cigarettes. He has a 6.00 pack-year smoking history. He has never used smokeless tobacco. He reports previous alcohol use. He reports that he does not use drugs.  Allergies  Allergen Reactions  .  Crestor [Rosuvastatin] Rash    Also had a red eye    Family History  Problem Relation Age of Onset  . Diabetes Other   . Pancreatic cancer Mother 13  . Colon cancer Neg Hx   . Colon polyps Neg Hx   . Esophageal cancer Neg Hx   . Rectal cancer Neg Hx   . Stomach cancer Neg Hx      Prior to Admission medications   Medication Sig Start Date End Date Taking? Authorizing Provider  aspirin EC 81 MG tablet Take 1 tablet (81 mg total) by mouth daily. Swallow whole. 03/14/20   Lelon Perla, MD  bismuth subsalicylate (PEPTO BISMOL) 262 MG/15ML suspension  Take 30 mLs by mouth every 6 (six) hours as needed for indigestion.    [provider]  blood glucose meter kit and supplies Dispense based on patient and insurance preference. Use up to four times daily as directed. (FOR ICD-9 250.00, 250.01). 07/01/18   Angiulli, Lavon Paganini, PA-C  cholecalciferol (VITAMIN D3) 25 MCG (1000 UNIT) tablet Take 1,000 Units by mouth Every Tuesday,Thursday,and Saturday with dialysis.     [provider]  doxercalciferol (HECTOROL) 4 MCG/2ML injection Doxercalciferol (Hectorol) 07/22/20 07/21/21  [provider]  Insulin Pen Needle 31G X 5 MM MISC Use daily to inject insulin as instructed 07/01/18   Angiulli, Lavon Paganini, PA-C  iron sucrose (VENOFER) 20 MG/ML injection Iron Sucrose (Venofer) 09/14/20 10/05/20  [provider]  LANTUS SOLOSTAR 100 UNIT/ML Solostar Pen Inject 25 Units into the skin in the morning, at noon, and at bedtime. 06/08/19   [provider]  Methoxy PEG-Epoetin Beta (MIRCERA IJ) Mircera 08/31/20 08/30/21  [provider]  oxyCODONE-acetaminophen (PERCOCET) 5-325 MG tablet Take 1 tablet by mouth every 4 (four) hours as needed for severe pain. Patient not taking: Reported on 09/18/2020 07/19/20 07/19/21  Karoline Caldwell, PA-C  pantoprazole (PROTONIX) 40 MG tablet Take 40 mg by mouth daily. 08/16/20   [provider]  prednisoLONE acetate (PRED FORTE) 1 % ophthalmic suspension Place 1 drop into both eyes 3 (three) times daily. 08/16/20   [provider]  VELPHORO 500 MG chewable tablet Chew 500 mg by mouth 3 (three) times daily with meals.  06/14/20   [provider]    Physical Exam: Vitals:   09/26/20 2013 09/26/20 2015 09/26/20 2127 09/26/20 2358  BP: 113/79 113/79  137/62  Pulse: 86 87  85  Resp: (!) _0 Temp:   99.3 F (37.4 C)   TempSrc:   Rectal   SpO2: 96% 93%  99%    Constitutional: NAD, calm, comfortable Vitals:   09/26/20 2013 09/26/20 2015 09/26/20 2127  09/26/20 2358  BP: 113/79 113/79  137/62  Pulse: 86 87  85  Resp: (!) _1 Temp:   99.3 F (37.4 C)   TempSrc:   Rectal   SpO2: 96% 93%  99%   General: WDWN, Alert and oriented x3.  Eyes: EOMI, PERRL, conjunctivae normal.  Sclera nonicteric HENT:  [place]/AT, external ears normal.  Nares patent without epistasis.  Mucous membranes are moist. Neck: Soft, normal range of motion, supple, no masses, no thyromegaly.  Trachea midline Respiratory: clear to auscultation bilaterally, no wheezing, no crackles. Normal respiratory effort. No accessory muscle use.  Cardiovascular: Regular rate and rhythm, no murmurs / rubs / gallops. No extremity edema. Abdomen: Soft, RUQ tenderness, nondistended, Positive Murphy's sign. no rebound or guarding.  No masses palpated. Bowel  sounds normoactive Musculoskeletal: FROM. Amputation of left leg. no cyanosis. No joint deformity upper and lower extremities. no contractures. Normal muscle tone.  Skin: Warm, dry, intact no rashes, lesions, ulcers. No induration Neurologic: CN 2-12 grossly intact. Normal speech. Sensation intact, patella DTR +1 bilaterally. Strength 5/5 in all extremities.   Psychiatric: Normal judgment and insight.  Normal mood.    Labs on Admission: I have personally reviewed following labs and imaging studies  CBC: Recent Labs  Lab 09/26/20 1748 09/26/20 1900  WBC  --  10.0  NEUTROABS  --  8.3*  HGB 10.2* 9.9*  HCT 30.0* 31.6*  MCV  --  89.5  PLT  --  366    Basic Metabolic Panel: Recent Labs  Lab 09/26/20 1748 09/26/20 1900  NA 135 135  K 4.6 4.6  CL 94* 93*  CO2  --  28  GLUCOSE 321* 306*  BUN 45* 45*  CREATININE 7.30* 7.00*  CALCIUM  --  8.4*    GFR: Estimated Creatinine Clearance: 16.6 mL/min (A) (by C-G formula based on SCr of 7 mg/dL (H)).  Liver Function Tests: Recent Labs  Lab 09/26/20 1900  AST 807*  ALT 1,154*  ALKPHOS 106  BILITOT 2.2*  PROT 6.8  ALBUMIN 3.3*    Urine analysis:    Component  Value Date/Time   COLORURINE YELLOW 09/26/2020 2023   APPEARANCEUR CLEAR 09/26/2020 2023   LABSPEC 1.013 09/26/2020 2023   PHURINE 9.0 (H) 09/26/2020 2023   GLUCOSEU 50 (A) 09/26/2020 2023   HGBUR SMALL (A) 09/26/2020 2023   BILIRUBINUR NEGATIVE 09/26/2020 2023   KETONESUR NEGATIVE 09/26/2020 2023   PROTEINUR >=300 (A) 09/26/2020 2023   UROBILINOGEN 0.2 05/04/2014 1002   NITRITE NEGATIVE 09/26/2020 2023   LEUKOCYTESUR NEGATIVE 09/26/2020 2023    Radiological Exams on Admission: CT HEAD WO CONTRAST  Result Date: 09/26/2020 CLINICAL DATA:  Transient neurologic deficit with decreased left hand grip strength lasting approximately 5 minutes EXAM: CT HEAD WITHOUT CONTRAST TECHNIQUE: Contiguous axial images were obtained from the base of the skull through the vertex without intravenous contrast. COMPARISON:  None. FINDINGS: Brain: Hypoattenuating foci in the bilateral insula/external capsules may reflect sequela prior lacunar type infarcts, favor remote though are overall age indeterminate. No other sites of acute CT evident large vascular territory infarct. No evidence of acute hemorrhage, hydrocephalus, extra-axial collection, visible mass lesion or mass effect. Slight prominence of the ventricles, cisterns and sulci compatible with parenchymal volume loss. Vascular: Atherosclerotic calcification of the carotid siphons. No hyperdense vessel. Skull: No calvarial fracture or suspicious osseous lesion. No scalp swelling or hematoma. Sinuses/Orbits: Paranasal sinuses and mastoid air cells are predominantly clear. Orbital structures are unremarkable aside from prior lens extractions. Other: None IMPRESSION: 1. Hypoattenuating foci in the bilateral insula/external capsules may reflect sequela prior lacunar type infarcts, favor remote though are overall age indeterminate. If there is persisting concern for acute infarction, MRI is more sensitive and specific for early features of ischemia. 2. No other sites of  acute CT evident large vascular territory or cortically based infarct. No other acute intracranial abnormality. 3. Intracranial atherosclerosis. Electronically Signed   By: Lovena Le M.D.   On: 09/26/2020 17:52   MR BRAIN WO CONTRAST  Result Date: 09/26/2020 CLINICAL DATA:  Weakness in the left hand. EXAM: MRI HEAD WITHOUT CONTRAST TECHNIQUE: Multiplanar, multiecho pulse sequences of the brain and surrounding structures were obtained without intravenous contrast. COMPARISON:  None. FINDINGS: Brain: No acute infarct, mass effect or extra-axial collection. No acute  or chronic hemorrhage. There is multifocal hyperintense T2-weighted signal within the white matter. Parenchymal volume and CSF spaces are normal. The midline structures are normal. Vascular: Major flow voids are preserved. Skull and upper cervical spine: Normal calvarium and skull base. Visualized upper cervical spine and soft tissues are normal. Sinuses/Orbits:No paranasal sinus fluid levels or advanced mucosal thickening. No mastoid or middle ear effusion. Normal orbits. IMPRESSION: 1. No acute intracranial abnormality. 2. Findings of mild chronic small vessel ischemic disease. Electronically Signed   By: Ulyses Jarred M.D.   On: 09/26/2020 23:10   US Abdomen Limited RUQ (LIVER/GB)  Result Date: 09/26/2020 CLINICAL DATA:  Right upper quadrant pain, discomfort EXAM: ULTRASOUND ABDOMEN LIMITED RIGHT UPPER QUADRANT COMPARISON:  CT 06/09/2020, renal ultrasound 07/12/2019 FINDINGS: Gallbladder: Hyperdense, shadowing biliary material likely reflecting an admixture of tiny stones and biliary sludge. Mild irregular gallbladder wall thickening without pericholecystic fluid. Sonographic Percell Miller sign is reportedly negative. Common bile duct: Diameter: 4.2 mm, nondilated Liver: Slightly lobular hepatic surface contour with mild heterogeneity of the hepatic parenchyma. No intrahepatic biliary ductal dilatation. No concerning focal hepatic lesion. Portal vein  is patent on color Doppler imaging with normal direction of blood flow towards the liver. Other: None. IMPRESSION: 1. Hyperdense, shadowing biliary material likely reflecting an admixture of tiny stones and biliary sludge with gallbladder wall thickening, somewhat equivocal for cholecystitis given concomitant findings in liver and the absence of a sonographic Murphy sign though could reflect an early or developing acute cholecystitis in the appropriate clinical context. If further imaging evaluation is required, consider HIDA. 2. Slightly lobular hepatic surface contour and parenchymal heterogeneity, can be seen in the setting of cirrhosis/intrinsic liver disease. Correlate with patient history and LFTs. Electronically Signed   By: Lovena Le M.D.   On: 09/26/2020 21:02    EKG: Independently reviewed. EKG shows NSR with first degree block. No acute ST elevation or depression. QTc is 488.   Assessment/Plan Principal Problem:   TIA (transient ischemic attack) Patient be observed on medical telemetry floor for TIA/CVA. MRI head has been obtained and negative for acute stroke. Will evaluate carotids for large vessel occlusion/stenosis. Obtain echocardiogram to evaluate for PFO, wall motion and ejection fraction. Antiplatelet therapy with aspirin daily. Check lipid panel.  Neurochecks per stroke protocol  Active Problems:   Acute cholecystitis Surgery has been consulted and Dr. Kae Heller will evaluate Edward [name] in the am.     Elevated LFTs Elevated LFTs. No previous labs to compare to. Consult GI for evaluation in am.     Anemia of chronic disease Stable    ESRD on hemodialysis  Chronic. Has HD on Tu,Th and Sat. Had full session on 09/26/2020    Diabetic polyneuropathy associated with type 2 diabetes mellitus  Continue basal insulin. Monitor blood sugar with meals and bedtime. SSI as needed for glycemic control.  Check HgbA1c level.       DVT prophylaxis: Heparin for DVT prophylaxis.   Code Status:   Full code Family Communication:  Edward [name] and plan discussed with patient.  Patient verbalized understanding agrees with plan.  Further recommendation to follow as clinical indicated Disposition Plan:   Patient is from: Home  Anticipated DC to:  Home  Anticipated DC date:  Anticipate 2 midnight stay in the hospital  Anticipated DC barriers: No barriers to discharge identified this time  Consults called:  Surgery, Dr. Kae Heller will see patient in the morning.  Gastroenterology is been consulted and secure chat message sent to Dr. Loletha Carrow with [facility]  GI by ER doctor Admission status:  Inpatient  Yevonne Aline Heidemarie Goodnow MD [facility]  How to contact the [facility] Attending or Consulting provider [place] or covering provider during after hours [place], for this patient?   1. Check the care team in [facility] and look for a) attending/consulting [facility] provider listed and b) the [facility] team listed 2. Log into [email or web address] and use [place]'s universal password to access. If you do not have the password, please contact the hospital operator. 3. Locate the [facility] provider you are looking for under [facility] and page to a number that you can be directly reached. 4. If you still have difficulty reaching the provider, please page the [facility] (Director on Call) for the Hospitalists listed on amion for assistance.  09/27/2020, 12:48 AM

## 2020-09-28 ENCOUNTER — Encounter: Payer: Medicare Other | Admitting: Gastroenterology

## 2020-09-28 ENCOUNTER — Telehealth: Payer: Self-pay

## 2020-09-28 LAB — COMPREHENSIVE METABOLIC PANEL
ALT: 908 U/L — ABNORMAL HIGH (ref 0–44)
AST: 278 U/L — ABNORMAL HIGH (ref 15–41)
Albumin: 3.1 g/dL — ABNORMAL LOW (ref 3.5–5.0)
Alkaline Phosphatase: 126 U/L (ref 38–126)
Anion gap: 15 (ref 5–15)
BUN: 69 mg/dL — ABNORMAL HIGH (ref 6–20)
CO2: 22 mmol/L (ref 22–32)
Calcium: 8.9 mg/dL (ref 8.9–10.3)
Chloride: 97 mmol/L — ABNORMAL LOW (ref 98–111)
Creatinine, Ser: 9.12 mg/dL — ABNORMAL HIGH (ref 0.61–1.24)
GFR, Estimated: 6 mL/min — ABNORMAL LOW (ref >60)
Glucose, Bld: 375 mg/dL — ABNORMAL HIGH (ref 70–99)
Potassium: 4.8 mmol/L (ref 3.5–5.1)
Sodium: 134 mmol/L — ABNORMAL LOW (ref 135–145)
Total Bilirubin: 1 mg/dL (ref 0.3–1.2)
Total Protein: 6.6 g/dL (ref 6.5–8.1)

## 2020-09-28 LAB — URINE CULTURE: Culture: <10000 — AB

## 2020-09-28 LAB — CBC
HCT: 30.7 % — ABNORMAL LOW (ref 39.0–52.0)
Hemoglobin: 9.6 g/dL — ABNORMAL LOW (ref 13.0–17.0)
MCH: 28.2 pg (ref 26.0–34.0)
MCHC: 31.3 g/dL (ref 30.0–36.0)
MCV: 90.3 fL (ref 80.0–100.0)
Platelets: 185 10*3/uL (ref 150–400)
RBC: 3.4 MIL/uL — ABNORMAL LOW (ref 4.22–5.81)
RDW: 15.2 % (ref 11.5–15.5)
WBC: 6.4 10*3/uL (ref 4.0–10.5)
nRBC: 0.3 % — ABNORMAL HIGH (ref 0.0–0.2)

## 2020-09-28 LAB — GLUCOSE, CAPILLARY
Glucose-Capillary: 319 mg/dL — ABNORMAL HIGH (ref 70–99)
Glucose-Capillary: 150 mg/dL — ABNORMAL HIGH (ref 70–99)
Glucose-Capillary: 301 mg/dL — ABNORMAL HIGH (ref 70–99)
Glucose-Capillary: 286 mg/dL — ABNORMAL HIGH (ref 70–99)

## 2020-09-28 LAB — PROTIME-INR
INR: 1.2 (ref 0.8–1.2)
Prothrombin Time: 14.4 seconds (ref 11.4–15.2)

## 2020-09-28 LAB — ANTINUCLEAR ANTIBODIES, IFA: ANA Ab, IFA: NEGATIVE

## 2020-09-28 LAB — MAGNESIUM: Magnesium: 2.3 mg/dL (ref 1.7–2.4)

## 2020-09-28 MED ORDER — DARBEPOETIN ALFA 60 MCG/0.3ML IJ SOSY
60.0000 ug | PREFILLED_SYRINGE | INTRAMUSCULAR | Status: DC
  Filled 2020-09-28: qty 0.3

## 2020-09-28 MED ORDER — INSULIN ASPART PROT & ASPART (70-30 MIX) 100 UNIT/ML ~~LOC~~ SUSP
25.0000 [IU] | Freq: Two times a day (BID) | SUBCUTANEOUS | Status: DC
  Administered 2020-09-28 – 2020-09-29 (×2): 25 [IU] via SUBCUTANEOUS
  Filled 2020-09-28: qty 10

## 2020-09-28 MED ORDER — DARBEPOETIN ALFA 60 MCG/0.3ML IJ SOSY
60.0000 ug | PREFILLED_SYRINGE | Freq: Once | INTRAMUSCULAR | Status: AC
  Administered 2020-09-28: 60 ug via SUBCUTANEOUS
  Filled 2020-09-28: qty 0.3

## 2020-09-28 MED ORDER — DARBEPOETIN ALFA 60 MCG/0.3ML IJ SOSY: 60.0000 ug | PREFILLED_SYRINGE | INTRAMUSCULAR | Status: DC

## 2020-09-28 NOTE — Progress Notes (Signed)
[facility] Progress Note  Patient: Edward [name]    PNT:[id number]  DOA: 09/26/2020     Date of Service: the patient was seen and examined on 09/28/2020  Brief hospital course: Possible due to history of anemia, anxiety, CHF, ESRD on HD, CAD, type II DM, GERD, HTN, amputation.  Presents with complaints of left-sided numbness and weakness as well as vomiting. Found to have severely elevated LFT. CVA ruled out. Currently plan is monitor LFT.  Assessment and Plan: 1.  Left-sided weakness. Potential TIA CT head shows chronic lacunar infarct. MRI brain no acute infarct or mass-effect.  Chronic small vessel ischemic disease. Carotid Doppler no significant stenosis. Echocardiogram EF 50 to 55% with apical hypokinesis and grade 2 diastolic dysfunction. Recent dobutamine stress test on 09/19/2020 was negative. Hemoglobin A1c 10.4, improving from 14.7. LDL 81 goal less than 70. PT OT assessment-no PT OT required outpatient. No significant events on telemetry. On 81 mg aspirin prior to admission.  We will continue the same for now. Currently not on any statin although given his severe [place] elevated LFT not a candidate right now.  2.  Shock liver. Severely elevated LFT, in 1000s Elevated INR Hyperbilirubinemia GI consulted. There was a concern for cholecystitis which ruled out.  General surgery was consulted was signed off. Hepatitis panel negative. INR improving on its own. LFTs continue to improve. Tylenol level negative. Ultrasound Doppler of liver negative as well. No abdominal pain on exam or on presentation. Drug-induced liver injury also less likely. At present GI currently signed off.  Recommend no further work-up as long as LFT continues to trend down. We will monitor tomorrow and likely discharge home tomorrow. His colonoscopy currently scheduled this month will be postponed. Nausea and vomiting have resolved.  3.  ESRD on HD TTS Anemia of ESRD Nephrology  consulted. Currently using AV fistula.  Nephrology recommending removal of permacath outpatient. H&H stable.  Monitor.  4.  GERD. Continue PPI.  5.  Type 2 diabetes mellitus, uncontrolled with, hyperglycemia, hyperlipidemia and renal complication.  Insulin-dependent Globin A1c significantly elevated. Although currently trending down from his prior baseline. Patient is on Lantus at home.  Tells me that his blood sugars remains elevated. Patient was on insulin 70/30 in the past and he feels that he will have a better control with this medicine although tells me that when he was on that medicine he had blurred vision. Currently transitioning him to insulin 70/30 at a higher dose 30 units twice daily. Outpatient follow-up recommended.  6.  Obesity Placing the patient at high risk for poor outcome. Dietary education recommended. Body mass index is 34.71 kg/m.   Diet: Renal carb modified diet DVT Prophylaxis:   heparin injection 5,000 Units Start: 09/27/20 0600    Advance goals of care discussion: Full code  Family Communication: no family was present at bedside, at the time of interview.   Disposition:  Status is: Inpatient  Remains inpatient appropriate because:Ongoing diagnostic testing needed not appropriate for outpatient work up   Dispo: The patient is from: Home              Anticipated d/c is to: Home              Anticipated d/c date is: 1 day              Patient currently is not medically stable to d/c.   Difficult to place patient   Subjective: No nausea no vomiting.  No fever no chills.  Seen after hemodialysis.  No shortness of breath.  No cough.  Physical Exam:  General: Appear in mild distress, no Rash; Oral Mucosa Clear, moist. no Abnormal Neck Mass Or lumps, Conjunctiva normal  Cardiovascular: S1 and S2 Present, aortic systolic  Murmur, Respiratory: good respiratory effort, Bilateral Air entry present and CTA, no Crackles, no wheezes Abdomen: Bowel Sound  present, Soft and no tenderness Extremities: no Pedal edema Neurology: alert and oriented to time, place, and person affect appropriate. no new focal deficit Gait not checked due to patient safety concerns  Vitals:   09/28/20 0735 09/28/20 0744 09/28/20 1127 09/28/20 1555  BP:  (!) 103/58 (!) 145/86 135/74  Pulse:  (!) 55 80 77  Resp:  18 18 16   Temp:  98 F (36.7 C) 98 F (36.7 C) 98 F (36.7 C)  TempSrc:      SpO2:  99% 100% 100%  Weight: 116.1 kg     Height:        Intake/Output Summary (Last 24 hours) at 09/28/2020 1845 Last data filed at 09/28/2020 1610 Gross per 24 hour  Intake 240 ml  Output 625 ml  Net -385 ml   Filed Weights   09/27/20 1520 09/28/20 0735  Weight: 115.3 kg 116.1 kg    Data Reviewed: I have personally reviewed and interpreted daily labs, tele strips, imaging. I reviewed all nursing notes, pharmacy notes, vitals, pertinent old records I have discussed plan of care as described above with RN and patient/family.  CBC: Recent Labs  Lab 09/26/20 1748 09/26/20 1900 09/27/20 0205 09/27/20 0452 09/28/20 0340  WBC  --  10.0 8.0 7.4 6.4  NEUTROABS  --  8.3*  --   --   --   HGB 10.2* 9.9* 9.1* 9.8* 9.6*  HCT 30.0* 31.6* 30.2* 30.5* 30.7*  MCV  --  89.5 91.8 91.0 90.3  PLT  --  174 162 183 967   Basic Metabolic Panel: Recent Labs  Lab 09/26/20 1748 09/26/20 1900 09/27/20 0205 09/27/20 0452 09/28/20 0340  NA 135 135  --  136 134*  K 4.6 4.6  --  4.6 4.8  CL 94* 93*  --  96* 97*  CO2  --  28  --  22 22  GLUCOSE 321* 306*  --  265* 375*  BUN 45* 45*  --  57* 69*  CREATININE 7.30* 7.00* 7.86* 8.06* 9.12*  CALCIUM  --  8.4*  --  8.4* 8.9  MG  --   --   --   --  2.3    Studies: No results found.  Scheduled Meds: . aspirin EC  81 mg Oral Daily  . Chlorhexidine Gluconate Cloth  6 each Topical Daily  . [START ON 10/12/2020] darbepoetin (ARANESP) injection - DIALYSIS  60 mcg Intravenous Q Thu-HD  . heparin  5,000 Units Subcutaneous Q8H  .  insulin aspart  0-15 Units Subcutaneous TID WC  . insulin aspart  0-5 Units Subcutaneous QHS  . insulin aspart protamine- aspart  25 Units Subcutaneous BID WC  . pantoprazole  40 mg Oral Daily   Continuous Infusions: PRN Meds:   Time spent: 35 minutes  Author: Berle Mull, MD [facility] 09/28/2020 6:45 PM  To reach On-call, see care teams to locate the attending and reach out via [email or web address]. Between 7PM-7AM, please contact night-coverage If you still have difficulty reaching the attending provider, please page the [facility] (Director on Call) for [facility] on amion for assistance.

## 2020-09-28 NOTE — Progress Notes (Signed)
SLP Cancellation Note  Patient Details Name: Edward [name] MRN: [id number] DOB: 1971-03-27   Cancelled treatment:       Reason Eval/Treat Not Completed: SLP screened, no needs identified, will sign off. Pt reports no changes in cognitive linguistics. Imaging workup negative for acute intracranial abnormalities.    [place], CCC-SLP Acute Rehabilitation Services   09/28/2020, 2:34 PM

## 2020-09-28 NOTE — Procedures (Signed)
   I was present at this dialysis session, have reviewed the session itself and made  appropriate changes Kelly Splinter MD [place] pager [phone]   09/28/2020, 8:16 AM

## 2020-09-28 NOTE — Telephone Encounter (Signed)
Thank you for letting me know.  - HD

## 2020-09-28 NOTE — Telephone Encounter (Signed)
-----   Message from [place], [place] sent at 09/28/2020  2:55 PM EST ----- Regarding: Cancel this patients colon Just a heads up this patient is currently hospitalized and will not be able to make his colon appt on February 14th with dr Loletha Carrow. Please cancel this procedure.  Thanks-JLL

## 2020-09-28 NOTE — Progress Notes (Signed)
Progress Note   Subjective  Chief Complaint: Elevated LFTs, nausea and vomiting  Today, the patient is found in hemodialysis.  He is sleeping.  He is easily woken and tells me that he is feeling okay today but just a little bit dehydrated and slightly nauseous.  Denies any abdominal pain or other new complaints.   Objective   Vital signs in last 24 hours: Temp:  [98 F (36.7 C)-98.5 F (36.9 C)] 98 F (36.7 C) (02/10 0744) Pulse Rate:  [55-84] 55 (02/10 0744) Resp:  [14-20] 18 (02/10 0744) BP: (103-172)/(58-95) 103/58 (02/10 0744) SpO2:  [95 %-100 %] 99 % (02/10 0744) Weight:  [115.3 kg-116.1 kg] 116.1 kg (02/10 0735) Last BM Date: 09/26/20 General:    AA male in NAD Heart:  Regular rate and rhythm; no murmurs Lungs: Respirations even and unlabored, lungs CTA bilaterally Abdomen:  Soft, nontender and nondistended. Normal bowel sounds. Psych:  Cooperative. Normal mood and affect.  Intake/Output from previous day: 02/09 0701 - 02/10 0700 In: 240 [P.O.:240] Out: 575 [Urine:575]  Lab Results: Recent Labs    09/27/20 0205 09/27/20 0452 09/28/20 0340  WBC 8.0 7.4 6.4  HGB 9.1* 9.8* 9.6*  HCT 30.2* 30.5* 30.7*  PLT 162 183 185   BMET Recent Labs    09/26/20 1900 09/27/20 0205 09/27/20 0452 09/28/20 0340  NA 135  --  136 134*  K 4.6  --  4.6 4.8  CL 93*  --  96* 97*  CO2 28  --  22 22  GLUCOSE 306*  --  265* 375*  BUN 45*  --  57* 69*  CREATININE 7.00* 7.86* 8.06* 9.12*  CALCIUM 8.4*  --  8.4* 8.9   Hepatic Function Latest Ref Rng & Units 09/28/2020 09/27/2020 09/26/2020  Total Protein 6.5 - 8.1 g/dL 6.6 6.7 6.8  Albumin 3.5 - 5.0 g/dL 3.1(L) 3.2(L) 3.3(L)  AST 15 - 41 U/L 278(H) 549(H) 807(H)  ALT 0 - 44 U/L 908(H) 1,083(H) 1,154(H)  Alk Phosphatase 38 - 126 U/L 126 123 106  Total Bilirubin 0.3 - 1.2 mg/dL 1.0 1.7(H) 2.2(H)  Bilirubin, Direct 0.0 - 0.2 mg/dL - - -   PT/INR Recent Labs    09/27/20 1345 09/28/20 0340  LABPROT 15.8* 14.4  INR 1.3* 1.2     Studies/Results: CT HEAD WO CONTRAST  Result Date: 09/26/2020 CLINICAL DATA:  Transient neurologic deficit with decreased left hand grip strength lasting approximately 5 minutes EXAM: CT HEAD WITHOUT CONTRAST TECHNIQUE: Contiguous axial images were obtained from the base of the skull through the vertex without intravenous contrast. COMPARISON:  None. FINDINGS: Brain: Hypoattenuating foci in the bilateral insula/external capsules may reflect sequela prior lacunar type infarcts, favor remote though are overall age indeterminate. No other sites of acute CT evident large vascular territory infarct. No evidence of acute hemorrhage, hydrocephalus, extra-axial collection, visible mass lesion or mass effect. Slight prominence of the ventricles, cisterns and sulci compatible with parenchymal volume loss. Vascular: Atherosclerotic calcification of the carotid siphons. No hyperdense vessel. Skull: No calvarial fracture or suspicious osseous lesion. No scalp swelling or hematoma. Sinuses/Orbits: Paranasal sinuses and mastoid air cells are predominantly clear. Orbital structures are unremarkable aside from prior lens extractions. Other: None IMPRESSION: 1. Hypoattenuating foci in the bilateral insula/external capsules may reflect sequela prior lacunar type infarcts, favor remote though are overall age indeterminate. If there is persisting concern for acute infarction, MRI is more sensitive and specific for early features of ischemia. 2. No other sites of acute  CT evident large vascular territory or cortically based infarct. No other acute intracranial abnormality. 3. Intracranial atherosclerosis. Electronically Signed   By: Lovena Le M.D.   On: 09/26/2020 17:52   MR BRAIN WO CONTRAST  Result Date: 09/26/2020 CLINICAL DATA:  Weakness in the left hand. EXAM: MRI HEAD WITHOUT CONTRAST TECHNIQUE: Multiplanar, multiecho pulse sequences of the brain and surrounding structures were obtained without intravenous contrast.  COMPARISON:  None. FINDINGS: Brain: No acute infarct, mass effect or extra-axial collection. No acute or chronic hemorrhage. There is multifocal hyperintense T2-weighted signal within the white matter. Parenchymal volume and CSF spaces are normal. The midline structures are normal. Vascular: Major flow voids are preserved. Skull and upper cervical spine: Normal calvarium and skull base. Visualized upper cervical spine and soft tissues are normal. Sinuses/Orbits:No paranasal sinus fluid levels or advanced mucosal thickening. No mastoid or middle ear effusion. Normal orbits. IMPRESSION: 1. No acute intracranial abnormality. 2. Findings of mild chronic small vessel ischemic disease. Electronically Signed   By: Ulyses Jarred M.D.   On: 09/26/2020 23:10   ECHOCARDIOGRAM COMPLETE  Result Date: 09/27/2020    ECHOCARDIOGRAM REPORT   Patient Name:   Edward [name] Date of Exam: 09/27/2020 Medical Rec #:  [id number]      Height:       72.0 in Accession #:    [id number]     Weight:       255.1 lb Date of Birth:  1971/01/09      BSA:          2.363 m Patient Age:    50 years       BP:           157/94 mmHg Patient Gender: M              HR:           76 bpm. Exam Location:  Inpatient Procedure: 2D Echo, Color Doppler and Cardiac Doppler Indications:    TIA  History:        Patient has prior history of Echocardiogram examinations, most                 recent 09/19/2020. CHF, CAD; Risk Factors:Hypertension and                 Diabetes.  Sonographer:    Raquel Sarna Senior RDCS Referring Phys: [id number] [place]  1. Abnormal septal motion Distal septal / apical hypokinesis . Left ventricular ejection fraction, by estimation, is 50 to 55%. The left ventricle has low normal function. The left ventricle demonstrates regional wall motion abnormalities (see scoring diagram/findings for description). The left ventricular internal cavity size was mildly dilated. There is mild left ventricular hypertrophy. Left ventricular  diastolic parameters are consistent with Grade II diastolic dysfunction (pseudonormalization). Elevated left ventricular end-diastolic pressure.  2. Right ventricular systolic function is normal. The right ventricular size is normal. There is moderately elevated pulmonary artery systolic pressure.  3. Left atrial size was moderately dilated.  4. The pericardial effusion is posterior to the left ventricle.  5. Carpentier class 3B restricted posterior leaflet motion . The mitral valve is abnormal. Moderate to severe mitral valve regurgitation. No evidence of mitral stenosis.  6. The aortic valve is tricuspid. Aortic valve regurgitation is mild. No aortic stenosis is present.  7. The inferior vena cava is normal in size with greater than 50% respiratory variability, suggesting right atrial pressure of 3 mmHg. FINDINGS  Left Ventricle: Abnormal septal motion  Distal septal / apical hypokinesis. Left ventricular ejection fraction, by estimation, is 50 to 55%. The left ventricle has low normal function. The left ventricle demonstrates regional wall motion abnormalities. The left ventricular internal cavity size was mildly dilated. There is mild left ventricular hypertrophy. Left ventricular diastolic parameters are consistent with Grade II diastolic dysfunction (pseudonormalization). Elevated left ventricular end-diastolic pressure. Right Ventricle: The right ventricular size is normal. No increase in right ventricular wall thickness. Right ventricular systolic function is normal. There is moderately elevated pulmonary artery systolic pressure. The tricuspid regurgitant velocity is 3.16 m/s, and with an assumed right atrial pressure of 8 mmHg, the estimated right ventricular systolic pressure is 73.4 mmHg. Left Atrium: Left atrial size was moderately dilated. Right Atrium: Right atrial size was normal in size. Pericardium: Trivial pericardial effusion is present. The pericardial effusion is posterior to the left ventricle.  Mitral Valve: Carpentier class 3B restricted posterior leaflet motion. The mitral valve is abnormal. There is moderate thickening of the mitral valve leaflet(s). There is mild calcification of the mitral valve leaflet(s). Moderate to severe mitral valve regurgitation. No evidence of mitral valve stenosis. Tricuspid Valve: The tricuspid valve is normal in structure. Tricuspid valve regurgitation is mild . No evidence of tricuspid stenosis. Aortic Valve: The aortic valve is tricuspid. Aortic valve regurgitation is mild. Aortic regurgitation PHT measures 508 msec. No aortic stenosis is present. Pulmonic Valve: The pulmonic valve was normal in structure. Pulmonic valve regurgitation is not visualized. No evidence of pulmonic stenosis. Aorta: The aortic root is normal in size and structure. Venous: The inferior vena cava is normal in size with greater than 50% respiratory variability, suggesting right atrial pressure of 3 mmHg. IAS/Shunts: No atrial level shunt detected by color flow Doppler.  LEFT VENTRICLE PLAX 2D LVIDd:         6.00 cm  Diastology LVIDs:         4.60 cm  LV e' medial:    7.18 cm/s LV PW:         0.90 cm  LV E/e' medial:  17.7 LV IVS:        1.30 cm  LV e' lateral:   8.27 cm/s LVOT diam:     2.20 cm  LV E/e' lateral: 15.4 LV SV:         77 LV SV Index:   33 LVOT Area:     3.80 cm  RIGHT VENTRICLE RV S prime:     10.90 cm/s TAPSE (M-mode): 1.9 cm LEFT ATRIUM              Index       RIGHT ATRIUM           Index LA diam:        4.70 cm  1.99 cm/m  RA Area:     20.90 cm LA Vol (A2C):   112.4 ml 47.60 ml/m RA Volume:   56.10 ml  23.75 ml/m LA Vol (A4C):   119.0 ml 50.37 ml/m LA Biplane Vol: 138.0 ml 58.41 ml/m  AORTIC VALVE LVOT Vmax:   105.00 cm/s LVOT Vmean:  72.000 cm/s LVOT VTI:    0.202 m AI PHT:      508 msec  AORTA Ao Root diam: 3.40 cm Ao Asc diam:  3.70 cm MITRAL VALVE                TRICUSPID VALVE MV Area (PHT): 3.31 cm     TR Peak grad:   39.9 mmHg MV Decel  Time: 229 msec     TR Vmax:         316.00 cm/s MR PISA:        4.02 cm MR PISA Radius: 0.80 cm     SHUNTS MV E velocity: 127.00 cm/s  Systemic VTI:  0.20 m MV A velocity: 56.80 cm/s   Systemic Diam: 2.20 cm MV E/A ratio:  2.24 Jenkins Rouge MD Electronically signed by Jenkins Rouge MD Signature Date/Time: 09/27/2020/1:41:25 PM    Final    VAS US CAROTID (at [facility] and [facility] only)  Result Date: 09/27/2020 Carotid Arterial Duplex Study Indications:       TIA and Left hand weakness and LUE numbness. Risk Factors:      Hypertension, Diabetes, past history of smoking, prior MI,                    coronary artery disease. Other Factors:     CHF, ESRD,. Comparison Study:  No previous exams. Performing Technologist: Rogelia Rohrer  Examination Guidelines: A complete evaluation includes B-mode imaging, spectral Doppler, color Doppler, and power Doppler as needed of all accessible portions of each vessel. Bilateral testing is considered an integral part of a complete examination. Limited examinations for reoccurring indications may be performed as noted.  Right Carotid Findings: +----------+--------+--------+--------+------------------+--------+           PSV cm/sEDV cm/sStenosisPlaque DescriptionComments +----------+--------+--------+--------+------------------+--------+ CCA Prox  74      17                                         +----------+--------+--------+--------+------------------+--------+ CCA Distal76      18                                         +----------+--------+--------+--------+------------------+--------+ ICA Prox  58      19                                         +----------+--------+--------+--------+------------------+--------+ ICA Distal55      19                                         +----------+--------+--------+--------+------------------+--------+ ECA       108     11                                         +----------+--------+--------+--------+------------------+--------+  +----------+--------+-------+--------------+-------------------+           PSV cm/sEDV cmsDescribe      Arm Pressure (mmHG) +----------+--------+-------+--------------+-------------------+ Subclavian               Not visualized                    +----------+--------+-------+--------------+-------------------+ +---------+--------+--+--------+--+---------+ VertebralPSV cm/s51EDV cm/s16Antegrade +---------+--------+--+--------+--+---------+  Left Carotid Findings: +----------+--------+--------+--------+------------------+-------------------+           PSV cm/sEDV cm/sStenosisPlaque DescriptionComments            +----------+--------+--------+--------+------------------+-------------------+ CCA Prox  76      15  intimal thickening  +----------+--------+--------+--------+------------------+-------------------+ CCA Distal81      15                                intimal thickening  +----------+--------+--------+--------+------------------+-------------------+ ICA Prox  53      17                                                    +----------+--------+--------+--------+------------------+-------------------+ ICA Distal45      17                                not well visualized +----------+--------+--------+--------+------------------+-------------------+ ECA       85      6                                                     +----------+--------+--------+--------+------------------+-------------------+ +----------+--------+--------+--------+-------------------+           PSV cm/sEDV cm/sDescribeArm Pressure (mmHG) +----------+--------+--------+--------+-------------------+ Subclavian152     63                                  +----------+--------+--------+--------+-------------------+ +---------+--------+--+--------+--+---------+ VertebralPSV cm/s42EDV cm/s14Antegrade  +---------+--------+--+--------+--+---------+ Previous DIA placement in LUE.  Summary: Right Carotid: The extracranial vessels were near-normal with only minimal wall                thickening or plaque. Left Carotid: The extracranial vessels were near-normal with only minimal wall               thickening or plaque. Vertebrals:  Bilateral vertebral arteries demonstrate antegrade flow. Subclavians: Left subclavian artery was not visualized. *See table(s) above for measurements and observations.     Preliminary    US Abdomen Limited RUQ (LIVER/GB)  Result Date: 09/26/2020 CLINICAL DATA:  Right upper quadrant pain, discomfort EXAM: ULTRASOUND ABDOMEN LIMITED RIGHT UPPER QUADRANT COMPARISON:  CT 06/09/2020, renal ultrasound 07/12/2019 FINDINGS: Gallbladder: Hyperdense, shadowing biliary material likely reflecting an admixture of tiny stones and biliary sludge. Mild irregular gallbladder wall thickening without pericholecystic fluid. Sonographic Percell Miller sign is reportedly negative. Common bile duct: Diameter: 4.2 mm, nondilated Liver: Slightly lobular hepatic surface contour with mild heterogeneity of the hepatic parenchyma. No intrahepatic biliary ductal dilatation. No concerning focal hepatic lesion. Portal vein is patent on color Doppler imaging with normal direction of blood flow towards the liver. Other: None. IMPRESSION: 1. Hyperdense, shadowing biliary material likely reflecting an admixture of tiny stones and biliary sludge with gallbladder wall thickening, somewhat equivocal for cholecystitis given concomitant findings in liver and the absence of a sonographic Murphy sign though could reflect an early or developing acute cholecystitis in the appropriate clinical context. If further imaging evaluation is required, consider HIDA. 2. Slightly lobular hepatic surface contour and parenchymal heterogeneity, can be seen in the setting of cirrhosis/intrinsic liver disease. Correlate with patient history and LFTs.  Electronically Signed   By: Lovena Le M.D.   On: 09/26/2020 21:02       Assessment / Plan:   Assessment: 1.  Nausea  and vomiting with elevated liver enzymes: Ultrasound suggests cholelithiasis, possibly cholecystitis, surgery consulted and Dr. Windle Guard recommended GI consult, acute viral hepatitis panel negative, liver enzymes are improving over the past 48 hours, INR normal overnight 2.  Possible cirrhosis versus intrinsic liver disease on ultrasound: Noncontrast CT October 2021 also suggest cirrhosis, no evidence of associated portal hypertension, FibroScan 09/11/2020 does not suggest cirrhosis 2.  Chronic anemia: Likely due to chronic disease  3.  ESRD on HD Tuesday, Thursday and Saturday 4.  Colon cancer screening: Scheduled next week with Dr. Loletha Carrow  Plan: 1.  Again discussed that shock liver in the setting of recent syncopal episode is certainly possible, liver work-up has been negative so far with negative hepatitis panel 2.  Continue to trend LFTs 3.  Please await further recommendations from Dr. Havery Moros later today  Thank you for kind consultation.    LOS: 1 day   Edward [name]  09/28/2020, 9:01 AM

## 2020-09-28 NOTE — Telephone Encounter (Signed)
Procedure has been cancelled.

## 2020-09-28 NOTE — Consult Note (Signed)
[facility] Renal Consultation Note  Indication for Consultation:  Management of ESRD/hemodialysis; anemia, hypertension/volume and secondary hyperparathyroidism  HPI: Edward [name] is a 50 y.o. male with  ESRD HD  start at [facility] 11/22/19( OP HD TTS)  ho T2DM, HTN, HFrEF (EF 45%), PAD (s/p R BKA, L TMA) - ACCESS: first stage AVF left 05/24/20, 2nd stage 07/19/20.   Who presented to ER yesterday for evaluation of weakness and tingling in his left arm having trouble holding items, also multiple episodes of vomiting having abdominal bloating and discomfort.  Reports some hematuria/no dysuria.  Noted he denies any fevers, chills sweats, diarrhea, chest pain shortness of breath or syncope.  It is normal dialysis session last months schedule 09/26/20 unremarkable except for large volume gains the past week  He is admitted with possible TIA,(MRI of the head was negative for acute event showed chronic small vessel ischemic disease.  Ultrasound showing cholecystitis, noted elevated LFTs.  K4.6, BUN 45, CR 7.0, WBC 10.0.  Hgb 9.9 UA unremarkable no blood.    Currently seen on hemodialysis stating he is feeling better.  We are consulted for ESRD /HD management        Past Medical History:  Diagnosis Date  . Anemia    getting Venofer with dialysis  . Anxiety    self reported, sometimes-situational anxiety  . Arthritis    bilateral hands/LEFT hip  . CHF (congestive heart failure) ([place])   . Chronic kidney disease    t th s dialysis- [place] ---ESRD  . Complication of anesthesia    slow to go to sleep  . Coronary artery disease   . Depression    self reported, sometimes-situational depression  . Diabetes mellitus    type II- on meds  . Dyspnea    sometimes- fluid  . GERD (gastroesophageal reflux disease)    on meds  . Hypertension    no longer on medication since starting on Hemodialysis  . Myocardial infarction [facility]) 2019    Past Surgical History:  Procedure Laterality Date  .  A/V FISTULAGRAM Left 03/06/2020   Procedure: A/V FISTULAGRAM;  Surgeon: Waynetta Sandy, MD;  Location: [place] [facility];  Service: Cardiovascular;  Laterality: Left;  . AMPUTATION Left 05/10/2016   Procedure: Left Transmetatarsal Amputation;  Surgeon: [name], MD;  Location: [place];  Service: Orthopedics;  Laterality: Left;  . AMPUTATION Right 05/16/2018   Procedure: PARTIAL RAY AMPUTATION FIFTH TOE RIGHT FOOT;  Surgeon: [name], DPM;  Location: [place];  Service: Podiatry;  Laterality: Right;  . AMPUTATION Right 06/17/2018   Procedure: AMPUTATION 4th RAY Right Foot;  Surgeon: Trula Slade, DPM;  Location: [place];  Service: Podiatry;  Laterality: Right;  . AMPUTATION Right 06/19/2018   Procedure: RIGHT BELOW KNEE AMPUTATION;  Surgeon: [name], MD;  Location: [place];  Service: Orthopedics;  Laterality: Right;  . APPLICATION OF WOUND VAC Right 06/19/2018   Procedure: APPLICATION OF WOUND VAC;  Surgeon: [name], MD;  Location: [place];  Service: Orthopedics;  Laterality: Right;  . AV FISTULA PLACEMENT Left 11/25/2019   Procedure: LEFT ARM Brachiocephalic  ARTERIOVENOUS (AV) FISTULA CREATION;  Surgeon: Rosetta Posner, MD;  Location: [place];  Service: Vascular;  Laterality: Left;  . BASCILIC VEIN TRANSPOSITION Left 07/19/2020   Procedure: LEFT SECOND STAGE BASCILIC VEIN TRANSPOSITION;  Surgeon: Waynetta Sandy, MD;  Location: [place];  Service: Vascular;  Laterality: Left;  . CARDIAC CATHETERIZATION  07/29/2018  .  CIRCUMCISION  09/02/2011   Procedure: CIRCUMCISION ADULT;  Surgeon: Molli Hazard, MD;  Location: [place];  Service: Urology;  Laterality: N/A;  . CORONARY STENT INTERVENTION N/A 07/29/2018   Procedure: CORONARY STENT INTERVENTION;  Surgeon: Leonie Man, MD;  Location: [place] [facility];  Service: Cardiovascular;  Laterality: N/A;  . I & D EXTREMITY  09/02/2011   Procedure: IRRIGATION AND DEBRIDEMENT EXTREMITY;  Surgeon:  Theodoro Kos, DO;  Location: [place];  Service: Plastics;  Laterality: N/A;  . INCISION AND DRAINAGE OF WOUND  08/12/2011   Procedure: IRRIGATION AND DEBRIDEMENT WOUND;  Surgeon: Zenovia Jarred, MD;  Location: [place];  Service: General;;  perineum  . INSERTION OF DIALYSIS CATHETER N/A 11/25/2019   Procedure: INSERTION OF Righ Internal Jugular DIALYSIS CATHETER;  Surgeon: Rosetta Posner, MD;  Location: [place];  Service: Vascular;  Laterality: N/A;  . IRRIGATION AND DEBRIDEMENT ABSCESS  08/12/2011   Procedure: IRRIGATION AND DEBRIDEMENT ABSCESS;  Surgeon: Molli Hazard, MD;  Location: [place];  Service: Urology;  Laterality: N/A;  irrigation and debridment perineum    . IRRIGATION AND DEBRIDEMENT ABSCESS  08/14/2011   Procedure: MINOR INCISION AND DRAINAGE OF ABSCESS;  Surgeon: Molli Hazard, MD;  Location: [place];  Service: Urology;  Laterality: N/A;  Perineal Wound Debridement;Placement Bilateral Testicular Thigh Pouches  . LEFT HEART CATH AND CORONARY ANGIOGRAPHY N/A 07/29/2018   Procedure: LEFT HEART CATH AND CORONARY ANGIOGRAPHY;  Surgeon: Leonie Man, MD;  Location: [place] [facility];  Service: Cardiovascular;  Laterality: N/A;  . REVISON OF ARTERIOVENOUS FISTULA Left 05/24/2020   Procedure: LEFT ARM ARTERIOVENOUS FISTULA  CONVERSION TO BASILIC VEIN FISTULA;  Surgeon: Waynetta Sandy, MD;  Location: [place];  Service: Vascular;  Laterality: Left;  . TOTAL HIP ARTHROPLASTY Left 10/11/2016   Procedure: LEFT TOTAL HIP ARTHROPLASTY ANTERIOR APPROACH;  Surgeon: Mcarthur Rossetti, MD;  Location: [facility];  Service: Orthopedics;  Laterality: Left;      Family History  Problem Relation Age of Onset  . Diabetes Other   . Pancreatic cancer Mother 30  . Colon cancer Neg Hx   . Colon polyps Neg Hx   . Esophageal cancer Neg Hx   . Rectal cancer Neg Hx   . Stomach cancer Neg Hx       reports that he quit smoking about 10 months ago. His smoking use  included cigarettes. He has a 6.00 pack-year smoking history. He has never used smokeless tobacco. He reports previous alcohol use. He reports that he does not use drugs.   Allergies  Allergen Reactions  . Crestor [Rosuvastatin] Rash    Also had a red eye    Prior to Admission medications   Medication Sig Start Date End Date Taking? Authorizing Provider  aspirin EC 81 MG tablet Take 1 tablet (81 mg total) by mouth daily. Swallow whole. 03/14/20  Yes Lelon Perla, MD  bismuth subsalicylate (PEPTO BISMOL) 262 MG/15ML suspension Take 30 mLs by mouth every 6 (six) hours as needed for indigestion.   Yes [provider]  blood glucose meter kit and supplies Dispense based on patient and insurance preference. Use up to four times daily as directed. (FOR ICD-9 250.00, 250.01). 07/01/18  Yes Angiulli, Lavon Paganini, PA-C  cholecalciferol (VITAMIN D3) 25 MCG (1000 UNIT) tablet Take 1,000 Units by mouth Every Tuesday,Thursday,and Saturday with dialysis.    Yes [provider]  doxercalciferol (HECTOROL) 4 MCG/2ML injection Doxercalciferol (Hectorol)  07/22/20 07/21/21 Yes [provider]  Insulin Pen Needle 31G X 5 MM MISC Use daily to inject insulin as instructed 07/01/18  Yes Angiulli, Lavon Paganini, PA-C  iron sucrose (VENOFER) 20 MG/ML injection Iron Sucrose (Venofer) 09/14/20 10/05/20 Yes [provider]  LANTUS SOLOSTAR 100 UNIT/ML Solostar Pen Inject 25 Units into the skin in the morning, at noon, and at bedtime. 06/08/19  Yes [provider]  Methoxy PEG-Epoetin Beta (MIRCERA IJ) Mircera 08/31/20 08/30/21 Yes [provider]  pantoprazole (PROTONIX) 40 MG tablet Take 40 mg by mouth daily. 08/16/20  Yes [provider]  prednisoLONE acetate (PRED FORTE) 1 % ophthalmic suspension Place 1 drop into both eyes 3 (three) times daily. 08/16/20  Yes [provider]  VELPHORO 500 MG chewable tablet Chew 500 mg by mouth 3 (three) times daily with  meals.  06/14/20  Yes [provider]  oxyCODONE-acetaminophen (PERCOCET) 5-325 MG tablet Take 1 tablet by mouth every 4 (four) hours as needed for severe pain. Patient not taking: No sig reported 07/19/20 07/19/21  Karoline Caldwell, PA-C     Anti-infectives (From admission, onward)   Start     Dose/Rate Route Frequency Ordered Stop   09/27/20 0745  cefTRIAXone (ROCEPHIN) 2 g in sodium chloride 0.9 % 100 mL IVPB  Status:  Discontinued        2 g 200 mL/hr over 30 Minutes Intravenous Every 24 hours 09/27/20 0744 09/27/20 1125      Results for orders placed or performed during the hospital encounter of 09/26/20 (from the past 48 hour(s))  I-stat chem 8, ED     Status: Abnormal   Collection Time: 09/26/20  5:48 PM  Result Value Ref Range   Sodium 135 135 - 145 mmol/L   Potassium 4.6 3.5 - 5.1 mmol/L   Chloride 94 (L) 98 - 111 mmol/L   BUN 45 (H) 6 - 20 mg/dL   Creatinine, Ser 7.30 (H) 0.61 - 1.24 mg/dL   Glucose, Bld 321 (H) 70 - 99 mg/dL    Comment: Glucose reference range applies only to samples taken after fasting for at least 8 hours.   Calcium, Ion 0.99 (L) 1.15 - 1.40 mmol/L   TCO2 31 22 - 32 mmol/L   Hemoglobin 10.2 (L) 13.0 - 17.0 g/dL   HCT 30.0 (L) 39.0 - 52.0 %  Protime-INR     Status: Abnormal   Collection Time: 09/26/20  7:00 PM  Result Value Ref Range   Prothrombin Time 18.3 (H) 11.4 - 15.2 seconds   INR 1.6 (H) 0.8 - 1.2    Comment: (NOTE) INR goal varies based on device and disease states. Performed at [facility], [place] [address] [place], [place] [address]   APTT     Status: None   Collection Time: 09/26/20  7:00 PM  Result Value Ref Range   aPTT 33 24 - 36 seconds    Comment: Performed at [facility] [address], [place], [place] [address]  CBC     Status: Abnormal   Collection Time: 09/26/20  7:00 PM  Result Value Ref Range   WBC 10.0 4.0 - 10.5 K/uL   RBC 3.53 (L) 4.22 - 5.81 MIL/uL   Hemoglobin 9.9 (L) 13.0 - 17.0 g/dL   HCT  31.6 (L) 39.0 - 52.0 %   MCV 89.5 80.0 - 100.0 fL   MCH 28.0 26.0 - 34.0 pg   MCHC 31.3 30.0 - 36.0 g/dL   RDW  15.7 (H) 11.5 - 15.5 %   Platelets 174 150 - 400 K/uL   nRBC 0.2 0.0 - 0.2 %    Comment: Performed at [facility], [place] [address] [place], [place] [address]  Differential     Status: Abnormal   Collection Time: 09/26/20  7:00 PM  Result Value Ref Range   Neutrophils Relative % 81 %   Neutro Abs 8.3 (H) 1.7 - 7.7 K/uL   Lymphocytes Relative 9 %   Lymphs Abs 0.9 0.7 - 4.0 K/uL   Monocytes Relative 6 %   Monocytes Absolute 0.6 0.1 - 1.0 K/uL   Eosinophils Relative 2 %   Eosinophils Absolute 0.2 0.0 - 0.5 K/uL   Basophils Relative 1 %   Basophils Absolute 0.1 0.0 - 0.1 K/uL   Immature Granulocytes 1 %   Abs Immature Granulocytes 0.08 (H) 0.00 - 0.07 K/uL    Comment: Performed at [facility] [address] [place], [place] [address]  Comprehensive metabolic panel     Status: Abnormal   Collection Time: 09/26/20  7:00 PM  Result Value Ref Range   Sodium 135 135 - 145 mmol/L   Potassium 4.6 3.5 - 5.1 mmol/L   Chloride 93 (L) 98 - 111 mmol/L   CO2 28 22 - 32 mmol/L   Glucose, Bld 306 (H) 70 - 99 mg/dL    Comment: Glucose reference range applies only to samples taken after fasting for at least 8 hours.   BUN 45 (H) 6 - 20 mg/dL   Creatinine, Ser 7.00 (H) 0.61 - 1.24 mg/dL   Calcium 8.4 (L) 8.9 - 10.3 mg/dL   Total Protein 6.8 6.5 - 8.1 g/dL   Albumin 3.3 (L) 3.5 - 5.0 g/dL   AST 807 (H) 15 - 41 U/L   ALT 1,154 (H) 0 - 44 U/L   Alkaline Phosphatase 106 38 - 126 U/L   Total Bilirubin 2.2 (H) 0.3 - 1.2 mg/dL   GFR, Estimated 9 (L) >60 mL/min    Comment: (NOTE) Calculated using the CKD-EPI Creatinine Equation (2021)    Anion gap 14 5 - 15    Comment: Performed at [facility], [place] [address] [place], [place] [address]  Lipase, blood     Status: None   Collection Time: 09/26/20  8:05 PM  Result Value Ref Range   Lipase 24 11 - 51 U/L    Comment:  Performed at [facility] [address] [place], [place] [address]  Urinalysis, Routine w reflex microscopic Urine, Clean Catch     Status: Abnormal   Collection Time: 09/26/20  8:23 PM  Result Value Ref Range   Color, Urine YELLOW YELLOW   APPearance CLEAR CLEAR   Specific Gravity, Urine 1.013 1.005 - 1.030   pH 9.0 (H) 5.0 - 8.0   Glucose, UA 50 (A) NEGATIVE mg/dL   Hgb urine dipstick SMALL (A) NEGATIVE   Bilirubin Urine NEGATIVE NEGATIVE   Ketones, ur NEGATIVE NEGATIVE mg/dL   Protein, ur >=300 (A) NEGATIVE mg/dL   Nitrite NEGATIVE NEGATIVE   Leukocytes,Ua NEGATIVE NEGATIVE   RBC / HPF 0-5 0 - 5 RBC/hpf   WBC, UA 0-5 0 - 5 WBC/hpf   Bacteria, UA RARE (A) NONE SEEN   Squamous Epithelial / LPF 0-5 0 - 5   Mucus PRESENT     Comment: Performed at [facility], [address] [place],  [address]  Resp Panel by RT-PCR (  Flu A&B, Covid) Nasopharyngeal Swab     Status: None   Collection Time: 09/26/20  8:28 PM   Specimen: Nasopharyngeal Swab; Nasopharyngeal(NP) swabs in vial transport medium  Result Value Ref Range   SARS Coronavirus 2 by RT PCR NEGATIVE NEGATIVE    Comment: (NOTE) SARS-CoV-2 target nucleic acids are NOT DETECTED.  The SARS-CoV-2 RNA is generally detectable in upper respiratory specimens during the acute phase of infection. The lowest concentration of SARS-CoV-2 viral copies this assay can detect is 138 copies/mL. A negative result does not preclude SARS-Cov-2 infection and should not be used as the sole basis for treatment or other patient management decisions. A negative result may occur with  improper specimen collection/handling, submission of specimen other than nasopharyngeal swab, presence of viral mutation(s) within the areas targeted by this assay, and inadequate number of viral copies(<138 copies/mL). A negative result must be combined with clinical observations, patient history, and epidemiological information. The expected result  is Negative.  Fact Sheet for Patients:  EntrepreneurPulse.com.au  Fact Sheet for Healthcare Providers:  IncredibleEmployment.be  This test is no t yet approved or cleared by the [place] FDA and  has been authorized for detection and/or diagnosis of SARS-CoV-2 by FDA under an Emergency Use Authorization (EUA). This EUA will remain  in effect (meaning this test can be used) for the duration of the COVID-19 declaration under Section 564(b)(1) of the Act, 21 U.S.C.section 360bbb-3(b)(1), unless the authorization is terminated  or revoked sooner.       Influenza A by PCR NEGATIVE NEGATIVE   Influenza B by PCR NEGATIVE NEGATIVE    Comment: (NOTE) The Xpert Xpress SARS-CoV-2/FLU/RSV plus assay is intended as an aid in the diagnosis of influenza from Nasopharyngeal swab specimens and should not be used as a sole basis for treatment. Nasal washings and aspirates are unacceptable for Xpert Xpress SARS-CoV-2/FLU/RSV testing.  Fact Sheet for Patients: EntrepreneurPulse.com.au  Fact Sheet for Healthcare Providers: IncredibleEmployment.be  This test is not yet approved or cleared by the [place] FDA and has been authorized for detection and/or diagnosis of SARS-CoV-2 by FDA under an Emergency Use Authorization (EUA). This EUA will remain in effect (meaning this test can be used) for the duration of the COVID-19 declaration under Section 564(b)(1) of the Act, 21 U.S.C. section 360bbb-3(b)(1), unless the authorization is terminated or revoked.  Performed at [facility], [place] [address] [place], [place] [address]   Urine culture     Status: Abnormal   Collection Time: 09/26/20  9:54 PM   Specimen: Urine, Random  Result Value Ref Range   Specimen Description URINE, RANDOM    Special Requests NONE    Culture (A)     <10,000 COLONIES/mL INSIGNIFICANT GROWTH Performed at [facility],  [place] [address], [place], [place] [address]    Report Status 09/28/2020 FINAL   Hepatitis panel, acute     Status: None   Collection Time: 09/26/20 11:12 PM  Result Value Ref Range   Hepatitis B Surface Ag NON REACTIVE NON REACTIVE   HCV Ab NON REACTIVE NON REACTIVE    Comment: (NOTE) Nonreactive HCV antibody screen is consistent with no HCV infections,  unless recent infection is suspected or other evidence exists to indicate HCV infection.     Hep A IgM NON REACTIVE NON REACTIVE   Hep B C IgM NON REACTIVE NON REACTIVE    Comment: Performed at [facility], [place] [address], [place], [place] [address]  CBC  Status: Abnormal   Collection Time: 09/27/20  2:05 AM  Result Value Ref Range   WBC 8.0 4.0 - 10.5 K/uL   RBC 3.29 (L) 4.22 - 5.81 MIL/uL   Hemoglobin 9.1 (L) 13.0 - 17.0 g/dL   HCT 30.2 (L) 39.0 - 52.0 %   MCV 91.8 80.0 - 100.0 fL   MCH 27.7 26.0 - 34.0 pg   MCHC 30.1 30.0 - 36.0 g/dL   RDW 15.7 (H) 11.5 - 15.5 %   Platelets 162 150 - 400 K/uL   nRBC 0.3 (H) 0.0 - 0.2 %    Comment: Performed at [facility] [address] [place], [place] [address]  Creatinine, serum     Status: Abnormal   Collection Time: 09/27/20  2:05 AM  Result Value Ref Range   Creatinine, Ser 7.86 (H) 0.61 - 1.24 mg/dL   GFR, Estimated 8 (L) >60 mL/min    Comment: (NOTE) Calculated using the CKD-EPI Creatinine Equation (2021) Performed at [facility] [address], [place], [place] [address]   Hemoglobin A1c     Status: Abnormal   Collection Time: 09/27/20  2:05 AM  Result Value Ref Range   Hgb A1c MFr Bld 10.4 (H) 4.8 - 5.6 %    Comment: (NOTE) Pre diabetes:          5.7%-6.4%  Diabetes:              >6.4%  Glycemic control for   <7.0% adults with diabetes    Mean Plasma Glucose 251.78 mg/dL    Comment: Performed at [facility] [address], [place], [place] [address]  Comprehensive metabolic panel     Status: Abnormal   Collection Time: 09/27/20  4:52 AM   Result Value Ref Range   Sodium 136 135 - 145 mmol/L   Potassium 4.6 3.5 - 5.1 mmol/L   Chloride 96 (L) 98 - 111 mmol/L   CO2 22 22 - 32 mmol/L   Glucose, Bld 265 (H) 70 - 99 mg/dL    Comment: Glucose reference range applies only to samples taken after fasting for at least 8 hours.   BUN 57 (H) 6 - 20 mg/dL   Creatinine, Ser 8.06 (H) 0.61 - 1.24 mg/dL   Calcium 8.4 (L) 8.9 - 10.3 mg/dL   Total Protein 6.7 6.5 - 8.1 g/dL   Albumin 3.2 (L) 3.5 - 5.0 g/dL   AST 549 (H) 15 - 41 U/L   ALT 1,083 (H) 0 - 44 U/L   Alkaline Phosphatase 123 38 - 126 U/L   Total Bilirubin 1.7 (H) 0.3 - 1.2 mg/dL   GFR, Estimated 7 (L) >60 mL/min    Comment: (NOTE) Calculated using the CKD-EPI Creatinine Equation (2021)    Anion gap 18 (H) 5 - 15    Comment: Performed at [facility], [place] [address], [place], [place] [address]  CBC     Status: Abnormal   Collection Time: 09/27/20  4:52 AM  Result Value Ref Range   WBC 7.4 4.0 - 10.5 K/uL   RBC 3.35 (L) 4.22 - 5.81 MIL/uL   Hemoglobin 9.8 (L) 13.0 - 17.0 g/dL   HCT 30.5 (L) 39.0 - 52.0 %   MCV 91.0 80.0 - 100.0 fL   MCH 29.3 26.0 - 34.0 pg   MCHC 32.1 30.0 - 36.0 g/dL   RDW 15.8 (H) 11.5 - 15.5 %   Platelets 183 150 - 400 K/uL  nRBC 0.4 (H) 0.0 - 0.2 %    Comment: Performed at [facility], [place] [address] [place], [place] [address]  Hemoglobin A1c     Status: Abnormal   Collection Time: 09/27/20  5:00 AM  Result Value Ref Range   Hgb A1c MFr Bld 10.4 (H) 4.8 - 5.6 %    Comment: (NOTE) Pre diabetes:          5.7%-6.4%  Diabetes:              >6.4%  Glycemic control for   <7.0% adults with diabetes    Mean Plasma Glucose 251.78 mg/dL    Comment: Performed at [facility] [address] [place], [place] [address]  Lipid panel     Status: Abnormal   Collection Time: 09/27/20  5:00 AM  Result Value Ref Range   Cholesterol 132 0 - 200 mg/dL   Triglycerides 126 <150 mg/dL   HDL 26 (L) >40 mg/dL   Total CHOL/HDL Ratio 5.1  RATIO   VLDL 25 0 - 40 mg/dL   LDL Cholesterol 81 0 - 99 mg/dL    Comment:        Total Cholesterol/HDL:CHD Risk Coronary Heart Disease Risk Table                     Men   Women  1/2 Average Risk   3.4   3.3  Average Risk       5.0   4.4  2 X Average Risk   9.6   7.1  3 X Average Risk  23.4   11.0        Use the calculated Patient Ratio above and the CHD Risk Table to determine the patient's CHD Risk.        ATP III CLASSIFICATION (LDL):  <100     mg/dL   Optimal  100-129  mg/dL   Near or Above                    Optimal  130-159  mg/dL   Borderline  160-189  mg/dL   High  >190     mg/dL   Very High Performed at [facility] [address] [place], [place] [address]   CBG monitoring, ED     Status: Abnormal   Collection Time: 09/27/20  8:18 AM  Result Value Ref Range   Glucose-Capillary 297 (H) 70 - 99 mg/dL    Comment: Glucose reference range applies only to samples taken after fasting for at least 8 hours.  Protime-INR     Status: Abnormal   Collection Time: 09/27/20  1:45 PM  Result Value Ref Range   Prothrombin Time 15.8 (H) 11.4 - 15.2 seconds   INR 1.3 (H) 0.8 - 1.2    Comment: (NOTE) INR goal varies based on device and disease states. Performed at [facility], [place] [address], [place], [place] [address]   Ethanol     Status: None   Collection Time: 09/27/20  1:45 PM  Result Value Ref Range   Alcohol, Ethyl (B) <10 <10 mg/dL    Comment: (NOTE) Lowest detectable limit for serum alcohol is 10 mg/dL.  For medical purposes only. Performed at [facility], [place] [address], [place], [place] [address]   Ammonia     Status: None   Collection Time: 09/27/20  1:45 PM  Result Value Ref Range   Ammonia 25 9 -  35 umol/L    Comment: Performed at [facility], [place] [address], [place], [place] [address]  Acetaminophen level     Status: Abnormal   Collection Time: 09/27/20  1:45 PM  Result Value Ref Range   Acetaminophen (Tylenol), Serum <10  (L) 10 - 30 ug/mL    Comment: (NOTE) Therapeutic concentrations vary significantly. A range of 10-30 ug/mL  may be an effective concentration for many patients. However, some  are best treated at concentrations outside of this range. Acetaminophen concentrations >150 ug/mL at 4 hours after ingestion  and >50 ug/mL at 12 hours after ingestion are often associated with  toxic reactions.  Performed at [facility], [place] [address], [place], [place] [address]   CBG monitoring, ED     Status: Abnormal   Collection Time: 09/27/20  2:34 PM  Result Value Ref Range   Glucose-Capillary 247 (H) 70 - 99 mg/dL    Comment: Glucose reference range applies only to samples taken after fasting for at least 8 hours.  Glucose, capillary     Status: Abnormal   Collection Time: 09/27/20  5:45 PM  Result Value Ref Range   Glucose-Capillary 301 (H) 70 - 99 mg/dL    Comment: Glucose reference range applies only to samples taken after fasting for at least 8 hours.  Glucose, capillary     Status: Abnormal   Collection Time: 09/27/20  9:14 PM  Result Value Ref Range   Glucose-Capillary 221 (H) 70 - 99 mg/dL    Comment: Glucose reference range applies only to samples taken after fasting for at least 8 hours.   Comment 1 Notify RN    Comment 2 Document in Chart   CBC     Status: Abnormal   Collection Time: 09/28/20  3:40 AM  Result Value Ref Range   WBC 6.4 4.0 - 10.5 K/uL   RBC 3.40 (L) 4.22 - 5.81 MIL/uL   Hemoglobin 9.6 (L) 13.0 - 17.0 g/dL   HCT 30.7 (L) 39.0 - 52.0 %   MCV 90.3 80.0 - 100.0 fL   MCH 28.2 26.0 - 34.0 pg   MCHC 31.3 30.0 - 36.0 g/dL   RDW 15.2 11.5 - 15.5 %   Platelets 185 150 - 400 K/uL   nRBC 0.3 (H) 0.0 - 0.2 %    Comment: Performed at [facility], [place] [address] [place], [place] [address]  Comprehensive metabolic panel     Status: Abnormal   Collection Time: 09/28/20  3:40 AM  Result Value Ref Range   Sodium 134 (L) 135 - 145 mmol/L   Potassium 4.8 3.5 - 5.1  mmol/L   Chloride 97 (L) 98 - 111 mmol/L   CO2 22 22 - 32 mmol/L   Glucose, Bld 375 (H) 70 - 99 mg/dL    Comment: Glucose reference range applies only to samples taken after fasting for at least 8 hours.   BUN 69 (H) 6 - 20 mg/dL   Creatinine, Ser 9.12 (H) 0.61 - 1.24 mg/dL   Calcium 8.9 8.9 - 10.3 mg/dL   Total Protein 6.6 6.5 - 8.1 g/dL   Albumin 3.1 (L) 3.5 - 5.0 g/dL   AST 278 (H) 15 - 41 U/L   ALT 908 (H) 0 - 44 U/L   Alkaline Phosphatase 126 38 - 126 U/L   Total Bilirubin 1.0 0.3 - 1.2 mg/dL   GFR, Estimated 6 (L) >60 mL/min    Comment: (NOTE) Calculated using the  CKD-EPI Creatinine Equation (2021)    Anion gap 15 5 - 15    Comment: Performed at [facility], [place] [address] [place], [place] [address]  Magnesium     Status: None   Collection Time: 09/28/20  3:40 AM  Result Value Ref Range   Magnesium 2.3 1.7 - 2.4 mg/dL    Comment: Performed at [facility] [address] [place], [place] [address]  Protime-INR     Status: None   Collection Time: 09/28/20  3:40 AM  Result Value Ref Range   Prothrombin Time 14.4 11.4 - 15.2 seconds   INR 1.2 0.8 - 1.2    Comment: (NOTE) INR goal varies based on device and disease states. Performed at [facility], [place] [address] [place], [place] [address]   Glucose, capillary     Status: Abnormal   Collection Time: 09/28/20  6:07 AM  Result Value Ref Range   Glucose-Capillary 319 (H) 70 - 99 mg/dL    Comment: Glucose reference range applies only to samples taken after fasting for at least 8 hours.  Glucose, capillary     Status: Abnormal   Collection Time: 09/28/20 11:25 AM  Result Value Ref Range   Glucose-Capillary 150 (H) 70 - 99 mg/dL    Comment: Glucose reference range applies only to samples taken after fasting for at least 8 hours.    ROS: See HPI  Physical Exam: Vitals:   09/28/20 0744 09/28/20 1127  BP: (!) 103/58 (!) 145/86  Pulse: (!) 55 80  Resp: 18 18  Temp: 98 F (36.7 C) 98 F (36.7 C)   SpO2: 99% 100%     General: Alert obese well-developed well-nourished male, NAD on hemodialysis HEENT: [place], EOMI, PERRLA, sclera not icteric Neck: Supple no JVD Heart: RRR, no MRG Lungs: CTA nonlabored breathing Abdomen: Bowel sounds normoactive, soft minimal right upper quadrant tenderness nondistended Extremities: Trace bipedal edema, right BKA left TMA Skin: Warm and dry no overt rash or ulcers appreciated Neuro: On hemodialysis alert oriented x3 moves all extremities to command lower extremity weakness resolved Dialysis Access: Patent on hemodialysis left upper arm AV fistula PermCath present  Dialysis Orders: [facility], TTS 4.5 hours  2K 2 CA bath EDW 111 kg 8000 heparin load, 2000 mid heparin Left upper arm AV fistula, presently using, has PermCath Mircera 30 units every 2 weeks last given 09/14/2020 Venofer 100 mg x 5 more treatments   Assessment/Plan 1. ESRD -HD on schedule TTS, only 2 and half hours HD times secondary to ^^^ In patient volume load, K4.8 ,using AV fistula check with center about pulling PermCath 2. Cholecystitis= admit team work-up with GI present, [name] consulted per note 3. Elevated LFTs/possible secondary to cholecystitis, possible cirrhosis versus intrinsic liver disease= GI seeing 4. Possible TIA= symptoms resolving now 5. Hypertension/volume  -by weights 6 kg up, tolerating UF currently BP mildly elevated improving with UF, no BP meds at home 6. Anemia  Of ESRD-Hgb 9.6 continue ESA on dialysis give Aranesp today hold iron load with current cholecystitis picture 7. Metabolic bone disease -calcium stable follow-up phosphorus and continue Venofer as binder with meals 8. Diabetes mellitus type 2= per admit 9. Nutrition -diet should be renal carb modified when taking p.o.'s protein supplement Nepro with albumin 3.1  Ernest Haber, PA-C [place] [phone] 09/28/2020, 11:46 AM

## 2020-09-28 NOTE — Plan of Care (Signed)
°  Problem: Education: °Goal: Knowledge of disease or condition will improve °Outcome: Progressing °Goal: Knowledge of secondary prevention will improve °Outcome: Progressing °Goal: Knowledge of patient specific risk factors addressed and post discharge goals established will improve °Outcome: Progressing °Goal: Individualized Educational Video(s) °Outcome: Progressing °  °Problem: Coping: °Goal: Will verbalize positive feelings about self °Outcome: Progressing °Goal: Will identify appropriate support needs °Outcome: Progressing °  °Problem: Health Behavior/Discharge Planning: °Goal: Ability to manage health-related needs will improve °Outcome: Progressing °  °Problem: Self-Care: °Goal: Ability to participate in self-care as condition permits will improve °Outcome: Progressing °Goal: Verbalization of feelings and concerns over difficulty with self-care will improve °Outcome: Progressing °Goal: Ability to communicate needs accurately will improve °Outcome: Progressing °  °Problem: Nutrition: °Goal: Risk of aspiration will decrease °Outcome: Progressing °Goal: Dietary intake will improve °Outcome: Progressing °  °Problem: Ischemic Stroke/TIA Tissue Perfusion: °Goal: Complications of ischemic stroke/TIA will be minimized °Outcome: Progressing °  °Problem: Education: °Goal: Knowledge of General Education information will improve °Description: Including pain rating scale, medication(s)/side effects and non-pharmacologic comfort measures °Outcome: Progressing °  °Problem: Health Behavior/Discharge Planning: °Goal: Ability to manage health-related needs will improve °Outcome: Progressing °  °Problem: Clinical Measurements: °Goal: Ability to maintain clinical measurements within normal limits will improve °Outcome: Progressing °Goal: Will remain free from infection °Outcome: Progressing °Goal: Diagnostic test results will improve °Outcome: Progressing °Goal: Respiratory complications will improve °Outcome: Progressing °Goal:  Cardiovascular complication will be avoided °Outcome: Progressing °  °Problem: Activity: °Goal: Risk for activity intolerance will decrease °Outcome: Progressing °  °Problem: Nutrition: °Goal: Adequate nutrition will be maintained °Outcome: Progressing °  °Problem: Coping: °Goal: Level of anxiety will decrease °Outcome: Progressing °  °Problem: Elimination: °Goal: Will not experience complications related to bowel motility °Outcome: Progressing °Goal: Will not experience complications related to urinary retention °Outcome: Progressing °  °Problem: Pain Managment: °Goal: General experience of comfort will improve °Outcome: Progressing °  °Problem: Safety: °Goal: Ability to remain free from injury will improve °Outcome: Progressing °  °Problem: Skin Integrity: °Goal: Risk for impaired skin integrity will decrease °Outcome: Progressing °  °

## 2020-09-28 NOTE — Progress Notes (Signed)
Inpatient Diabetes Program Recommendations  AACE/ADA: New Consensus Statement on Inpatient Glycemic Control (2015)  Target Ranges:  Prepandial:   less than 140 mg/dL      Peak postprandial:   less than 180 mg/dL (1-2 hours)      Critically ill patients:  140 - 180 mg/dL   Lab Results  Component Value Date   GLUCAP 319 (H) 09/28/2020   HGBA1C 10.4 (H) 09/27/2020    Review of Glycemic Control Results for CASTER, FAYETTE (MRN [id number]) as of 09/28/2020 09:35  Ref. Range 09/27/2020 08:18 09/27/2020 14:34 09/27/2020 17:45 09/27/2020 21:14 09/28/2020 06:07  Glucose-Capillary Latest Ref Range: 70 - 99 mg/dL 297 (H) 247 (H) 301 (H) 221 (H) 319 (H)   Diabetes history:  DM2 Outpatient Diabetes medications:  Lantus 25 units TID Current orders for Inpatient glycemic control: Lantus 25 units daily Novolog 0-15 units TID and 0-5 units qhs  Inpatient Diabetes Program Recommendations:    If patient will not be NPO please consider 70/30 30 units BID (equavalent of 42 units of Lantus).  Patient feels he needs to resume 70/30 for better control.    Will continue to follow while inpatient.  Thank you, Reche Dixon, RN, BSN Diabetes Coordinator Inpatient Diabetes Program [phone] (team pager from 8a-5p)

## 2020-09-29 ENCOUNTER — Other Ambulatory Visit: Payer: Self-pay

## 2020-09-29 ENCOUNTER — Telehealth: Payer: Self-pay

## 2020-09-29 DIAGNOSIS — R7989 Other specified abnormal findings of blood chemistry: Secondary | ICD-10-CM

## 2020-09-29 LAB — COMPREHENSIVE METABOLIC PANEL
ALT: 683 U/L — ABNORMAL HIGH (ref 0–44)
AST: 147 U/L — ABNORMAL HIGH (ref 15–41)
Albumin: 2.9 g/dL — ABNORMAL LOW (ref 3.5–5.0)
Alkaline Phosphatase: 107 U/L (ref 38–126)
Anion gap: 12 (ref 5–15)
BUN: 49 mg/dL — ABNORMAL HIGH (ref 6–20)
CO2: 25 mmol/L (ref 22–32)
Calcium: 8.2 mg/dL — ABNORMAL LOW (ref 8.9–10.3)
Chloride: 99 mmol/L (ref 98–111)
Creatinine, Ser: 7.46 mg/dL — ABNORMAL HIGH (ref 0.61–1.24)
GFR, Estimated: 8 mL/min — ABNORMAL LOW (ref >60)
Glucose, Bld: 260 mg/dL — ABNORMAL HIGH (ref 70–99)
Potassium: 4.3 mmol/L (ref 3.5–5.1)
Sodium: 136 mmol/L (ref 135–145)
Total Bilirubin: 0.9 mg/dL (ref 0.3–1.2)
Total Protein: 6.5 g/dL (ref 6.5–8.1)

## 2020-09-29 LAB — CBC
HCT: 30.1 % — ABNORMAL LOW (ref 39.0–52.0)
Hemoglobin: 9.4 g/dL — ABNORMAL LOW (ref 13.0–17.0)
MCH: 28.1 pg (ref 26.0–34.0)
MCHC: 31.2 g/dL (ref 30.0–36.0)
MCV: 89.9 fL (ref 80.0–100.0)
Platelets: 171 10*3/uL (ref 150–400)
RBC: 3.35 MIL/uL — ABNORMAL LOW (ref 4.22–5.81)
RDW: 15.5 % (ref 11.5–15.5)
WBC: 6.5 10*3/uL (ref 4.0–10.5)
nRBC: 0 % (ref 0.0–0.2)

## 2020-09-29 LAB — GLUCOSE, CAPILLARY: Glucose-Capillary: 285 mg/dL — ABNORMAL HIGH (ref 70–99)

## 2020-09-29 LAB — MAGNESIUM: Magnesium: 2.1 mg/dL (ref 1.7–2.4)

## 2020-09-29 MED ORDER — SUCROFERRIC OXYHYDROXIDE 500 MG PO CHEW
500.0000 mg | CHEWABLE_TABLET | Freq: Three times a day (TID) | ORAL | Status: DC
  Filled 2020-09-29: qty 1

## 2020-09-29 MED ORDER — CHLORHEXIDINE GLUCONATE CLOTH 2 % EX PADS
6.0000 | MEDICATED_PAD | Freq: Every day | CUTANEOUS | Status: DC
  Administered 2020-09-29: 6 via TOPICAL

## 2020-09-29 MED ORDER — NOVOLOG MIX 70/30 FLEXPEN (70-30) 100 UNIT/ML ~~LOC~~ SUPN: 30.0000 [IU] | PEN_INJECTOR | Freq: Two times a day (BID) | SUBCUTANEOUS | 0 refills | Status: DC

## 2020-09-29 MED ORDER — NOVOLOG MIX 70/30 FLEXPEN (70-30) 100 UNIT/ML ~~LOC~~ SUPN: 25.0000 [IU] | PEN_INJECTOR | Freq: Two times a day (BID) | SUBCUTANEOUS | 0 refills | Status: DC

## 2020-09-29 MED ORDER — INSULIN ASPART PROT & ASPART (70-30 MIX) 100 UNIT/ML ~~LOC~~ SUSP: 30.0000 [IU] | Freq: Two times a day (BID) | SUBCUTANEOUS | Status: DC

## 2020-09-29 NOTE — Discharge Instructions (Signed)
Liver Failure  The liver is a large organ in the upper right-hand side of the abdomen. It is involved in many important functions, including storing energy, producing fluids that the body needs, and removing harmful substances from the bloodstream. Liver failure is a condition in which the liver loses its ability to function due to injury or disease. Liver failure can develop quickly, over days or weeks (acute liver failure). It can also develop gradually, over months or years (chronic liver failure). What are the causes? Common causes of acute liver failure include:  Acetaminophen overdose.  Reaction to certain medicines.  Liver infection (viral hepatitis). Common causes of chronic liver failure include:  Viral hepatitis.  Liver disease.  Alcohol abuse. What are the signs or symptoms? Most people do not have symptoms in the early stages of liver failure. If early symptoms do develop, they may include:  Loss of appetite.  Nausea and vomiting.  Weakness and tiredness (fatigue).  Weight loss without trying.  Diarrhea. As the condition worsens, symptoms of this condition may include:  Yellowing of the skin and the whites of the eyes (jaundice).  Bruising and bleeding easily.  Itchy skin (pruritus).  Fluid buildup in the abdomen (ascites).  Personality and mood changes.  Confusion and sleepiness. How is this diagnosed? This condition is diagnosed based on your symptoms, your medical history, and a physical exam. You may have tests, including:  Blood tests.  CT scan.  MRI.  Ultrasound.  Removal of a small amount of liver tissue to be examined under a microscope (biopsy). How is this treated? Treatment for this condition depends on the cause and severity of symptoms. Treatment for chronic liver failure may include:  Medicines to help reduce symptoms.  Lifestyle changes, such as limiting salt and animal proteins in your diet. Foods that contain animal proteins  include red meat, fish, and dairy products. Acute or advanced (end stage) liver failure may require hospitalization. Treatment may include:  Giving antibiotic medicine.  Giving IV fluids that contain sugar (glucose) and minerals (electrolytes).  Flushing out toxic substances from the body using medicine (lactulose) or a cleansing procedure (enema).  Adding certain amounts of the liquid part of blood (plasma) to your bloodstream (receiving a transfusion).  Using an artificial kidney to filter your blood (hemodialysis) if you have renal failure.  Using breathing support and a breathing tube (respirator).  Having a liver transplant. This is a surgery to replace your liver with another person's liver (donor liver). This may be the best option if your liver has completely stopped functioning. Follow these instructions at home: Eating and drinking  Follow instructions from your health care provider about eating and drinking restrictions. This may include: ? Limiting the amount of animal protein that you eat. ? Increasing the amount of plant-based protein that you eat. Foods that contain plant-based proteins include whole grains, nuts, and vegetables. ? Taking vitamin supplements. ? Limiting the amount of salt that you eat. Lifestyle  Do not drink alcohol.  Do not use any products that contain nicotine or tobacco, such as cigarettes, e-cigarettes, and chewing tobacco. If you need help quitting, ask your health care provider.  Exercise regularly, as told by your health care provider.   General instructions  Take over-the-counter and prescription medicines only as told by your health care provider.  Follow instructions from your health care provider about maintaining your vaccinations, especially vaccinations against hepatitis A and B.  Keep all follow-up visits as told by your health care provider.  This is important. Contact a health care provider if:  You have symptoms that get  worse.  You lose a lot of weight without trying.  You have a fever or chills. Get help right away if:  You become confused or very sleepy.  You cannot take care of yourself or be taken care of at home.  You are not urinating.  You have difficulty breathing.  You vomit blood. Summary  Liver failure is a condition in which the liver loses its ability to function due to injury or disease.  Treatment for this condition depends on the cause and severity of symptoms.  Take over-the-counter and prescription medicines only as told by your health care provider.  Contact a health care provider if you have symptoms that get worse.  Keep all follow-up visits as told by your health care provider. This is important. This information is not intended to replace advice given to you by your health care provider. Make sure you discuss any questions you have with your health care provider. Document Revised: 01/14/2018 Document Reviewed: 01/14/2018 Elsevier Patient Education  2021 [employer].

## 2020-09-29 NOTE — Telephone Encounter (Signed)
-----  Message from Yetta Flock, MD sent at 09/28/2020  6:04 PM EST ----- Regarding: RE: danis patient Yes no problem at all, happy to see him.   [place] can you please place labs under me and book a routine office follow up. Thanks  ----- Message ----- From: Doran Stabler, MD Sent: 09/28/2020   4:32 PM EST To: Yetta Flock, MD, Yevette Edwards, RN Subject: RE: danis patient                              Thanks for the note Richardson Landry.  Since I have not seen him before and you are now familiar with him and his complex issues from this hospital stay, having him follow-up with you would seem good for continuity of care if you are agreeable to that.  - H ----- Message ----- From: Yetta Flock, MD Sent: 09/28/2020   4:07 PM EST To: Doran Stabler, MD, Yevette Edwards, RN Subject: danis patient                                  Herbert Seta this patient needs LFTs done next week, and colonoscopy cancelled next week. Will need follow up with Dr. Loletha Carrow or myself prior to rescheduling, he has significant comorbidities.  Mallie Mussel this patient was scheduled Monday for a screening but admitted with what I think is shock liver from hypotension. Not stable for procedure Monday so cancelling it. He was assigned to you but you have never met him. If you want me to see him in follow up I'm happy to do so, or we can book follow up with you, whatever your preference. Thanks

## 2020-09-29 NOTE — Discharge Summary (Signed)
[facility] Discharge Summary   Patient: Edward [name] ZJI:[id number]  PCP: [name], MD  Date of admission: 09/26/2020   Date of discharge: 09/29/2020      Discharge Diagnoses:   Principal Problem:   TIA (transient ischemic attack) Active Problems:   Diabetic polyneuropathy associated with type 2 diabetes mellitus ([place])   Anemia of chronic disease   Essential hypertension   Transaminitis   ESRD on hemodialysis ([place])   Acute cholecystitis   Elevated LFTs  Admitted From: Home Disposition:  Home   Recommendations for Outpatient Follow-up:  1. PCP: Follow-up with PCP in 1 week.  Follow-up with GI as recommended. 2. Follow up LABS/TEST: Repeat LFT in 1 week, needs TDC removal by IR.   Follow-up Information    [name], MD. Schedule an appointment as soon as possible for a visit in 1 week(s).   Specialty: Family Medicine Why: repeat LFT in 1 week Contact information: [address] [place] [address] [phone]        [facility]. Schedule an appointment as soon as possible for a visit in 1 month(s).   Specialty: Gastroenterology Contact information: [address] [place] [address] [phone]             Discharge Instructions    Ambulatory referral to Gastroenterology   Complete by: As directed    Diet renal/carb modified with fluid restriction   Complete by: As directed    Increase activity slowly   Complete by: As directed       Diet recommendation: Renal diet  Activity: The patient is advised to gradually reintroduce usual activities, as tolerated  Discharge Condition: stable  Code Status: Full code   History of present illness: As per the H and P dictated on admission, "ELEX MAINWARING is a 50 y.o. male with medical history significant for anemia, anxiety, CHF, ESRD, CAD, diabetes, GERD, hypertension, MI, who presents for evaluation of weakness and tingling in left hand which occurred  yesterday. Patient is concerned that he had a stroke yesterday. He states that he was having trouble holding an glass of drink yesterday with his left hand. This lasted for approximately 5 minutes and resolved on its own. He also had some left upper extremity numbness at that time. He denies noticing any left lower extremity weakness/numbness. He denies any facial droop, vision changes or aphasia. He denies headache. No LOC or head trauma.  He further notes that yesterday he had multiple episodes of vomiting and was having abdominal bloating and pain in right upper abdomen as well. He denies any fevers. He reports he had some hematuria yesterday. He denies any dysuria or frequency.  He has dialysis on Tu, Th and Sat and had his normal dialysis session on 09/26/2020"  Hospital Course:  Summary of his active problems in the hospital is as following. 1.  Left-sided weakness. Potential TIA CT head shows chronic lacunar infarct. MRI brain no acute infarct or mass-effect.  Chronic small vessel ischemic disease. Carotid Doppler no significant stenosis. Echocardiogram EF 50 to 55% with apical hypokinesis and grade 2 diastolic dysfunction. Recent dobutamine stress test on 09/19/2020 was negative. Hemoglobin A1c 10.4, improving from 14.7. LDL 81 goal less than 70. PT OT assessment-no PT OT required outpatient. No significant events on telemetry. On 81 mg aspirin prior to admission.  We will continue the same for now. Currently not on any statin although given his severe [place] elevated LFT not a candidate right  now.  2.  Shock liver. Severely elevated LFT, in 1000s Elevated INR Hyperbilirubinemia GI consulted. There was a concern for cholecystitis which ruled out.  General surgery was consulted was signed off. Hepatitis panel negative. INR improving on its own. LFTs continue to improve. Tylenol level negative. Ultrasound Doppler of liver negative as well. No abdominal pain on exam or on  presentation. Drug-induced liver injury also less likely. At present GI currently signed off.  Recommend no further work-up as long as LFT continues to trend down. His colonoscopy currently scheduled this month will be postponed. Nausea and vomiting have resolved.  3.  ESRD on HD TTS Anemia of ESRD Nephrology consulted. Currently using AV fistula.   Nephrology recommending removal of permacath outpatient. H&H stable.  Monitor.  4.  GERD. Continue PPI.  5.  Type 2 diabetes mellitus, uncontrolled with, hyperglycemia, hyperlipidemia and renal complication.  Insulin-dependent Globin A1c significantly elevated. Although currently trending down from his prior baseline. Patient is on Lantus at home.  Tells me that his blood sugars remains elevated. Patient was on insulin 70/30 in the past and he feels that he will have a better control with this medicine although tells me that when he was on that medicine he had blurred vision. Currently transitioning him to insulin 70/30 at a higher dose 30 units twice daily. Outpatient follow-up recommended.  6.  Obesity Placing the patient at high risk for poor outcome. Dietary education recommended. Body mass index is 34.71 kg/m.   Patient was seen by physical therapy, who recommended No therapy needed on discharge,. On the day of the discharge the patient's vitals were stable, and no other acute medical condition were reported by patient. The patient was felt safe to be discharge at Home with no therapy needed on discharge.  Consultants: Gastroenterology General surgery  Procedures: Echocardiogram   DISCHARGE MEDICATION: Allergies as of 09/29/2020      Reactions   Crestor [rosuvastatin] Rash   Also had a red eye      Medication List    STOP taking these medications   Lantus SoloStar 100 UNIT/ML Solostar Pen Generic drug: insulin glargine   oxyCODONE-acetaminophen 5-325 MG tablet Commonly known as: Percocet     TAKE these  medications   aspirin EC 81 MG tablet Take 1 tablet (81 mg total) by mouth daily. Swallow whole.   bismuth subsalicylate 076 AU/63FH suspension Commonly known as: PEPTO BISMOL Take 30 mLs by mouth every 6 (six) hours as needed for indigestion.   blood glucose meter kit and supplies Dispense based on patient and insurance preference. Use up to four times daily as directed. (FOR ICD-9 250.00, 250.01).   cholecalciferol 25 MCG (1000 UNIT) tablet Commonly known as: VITAMIN D3 Take 1,000 Units by mouth Every Tuesday,Thursday,and Saturday with dialysis. Notes to patient: Dialysis days   doxercalciferol 4 MCG/2ML injection Commonly known as: HECTOROL Doxercalciferol (Hectorol)   Insulin Pen Needle 31G X 5 MM Misc Use daily to inject insulin as instructed   iron sucrose 20 MG/ML injection Commonly known as: VENOFER Iron Sucrose (Venofer)   MIRCERA IJ Mircera   NovoLOG Mix 70/30 FlexPen (70-30) 100 UNIT/ML FlexPen Generic drug: insulin aspart protamine - aspart Inject 0.3 mLs (30 Units total) into the skin 2 (two) times daily with a meal.   pantoprazole 40 MG tablet Commonly known as: PROTONIX Take 40 mg by mouth daily.   prednisoLONE acetate 1 % ophthalmic suspension Commonly known as: PRED FORTE Place 1 drop into both eyes 3 (three)  times daily.   Velphoro 500 MG chewable tablet Generic drug: sucroferric oxyhydroxide Chew 500 mg by mouth 3 (three) times daily with meals.       Discharge Exam: Filed Weights   09/27/20 1520 09/28/20 0735  Weight: 115.3 kg 116.1 kg   Vitals:   09/29/20 0418 09/29/20 0830  BP: (!) 158/93 (!) 157/97  Pulse: 84 80  Resp: 18 19  Temp: 98 F (36.7 C) 98.2 F (36.8 C)  SpO2: 96% 100%   General: Appear in no distress, no Rash; Oral Mucosa Clear, moist. no Abnormal Neck Mass Or lumps, Conjunctiva normal  Cardiovascular: S1 and S2 Present, no Murmur Respiratory: good respiratory effort, Bilateral Air entry present and CTA, no Crackles,  no wheezes Abdomen: Bowel Sound present, Soft and no tenderness Extremities: no Pedal edema Neurology: alert and oriented to time, place, and person affect appropriate. no new focal deficit  The results of significant diagnostics from this hospitalization (including imaging, microbiology, ancillary and laboratory) are listed below for reference.    Significant Diagnostic Studies: CT HEAD WO CONTRAST  Result Date: 09/26/2020 CLINICAL DATA:  Transient neurologic deficit with decreased left hand grip strength lasting approximately 5 minutes EXAM: CT HEAD WITHOUT CONTRAST TECHNIQUE: Contiguous axial images were obtained from the base of the skull through the vertex without intravenous contrast. COMPARISON:  None. FINDINGS: Brain: Hypoattenuating foci in the bilateral insula/external capsules may reflect sequela prior lacunar type infarcts, favor remote though are overall age indeterminate. No other sites of acute CT evident large vascular territory infarct. No evidence of acute hemorrhage, hydrocephalus, extra-axial collection, visible mass lesion or mass effect. Slight prominence of the ventricles, cisterns and sulci compatible with parenchymal volume loss. Vascular: Atherosclerotic calcification of the carotid siphons. No hyperdense vessel. Skull: No calvarial fracture or suspicious osseous lesion. No scalp swelling or hematoma. Sinuses/Orbits: Paranasal sinuses and mastoid air cells are predominantly clear. Orbital structures are unremarkable aside from prior lens extractions. Other: None IMPRESSION: 1. Hypoattenuating foci in the bilateral insula/external capsules may reflect sequela prior lacunar type infarcts, favor remote though are overall age indeterminate. If there is persisting concern for acute infarction, MRI is more sensitive and specific for early features of ischemia. 2. No other sites of acute CT evident large vascular territory or cortically based infarct. No other acute intracranial  abnormality. 3. Intracranial atherosclerosis. Electronically Signed   By: Lovena Le M.D.   On: 09/26/2020 17:52   MR BRAIN WO CONTRAST  Result Date: 09/26/2020 CLINICAL DATA:  Weakness in the left hand. EXAM: MRI HEAD WITHOUT CONTRAST TECHNIQUE: Multiplanar, multiecho pulse sequences of the brain and surrounding structures were obtained without intravenous contrast. COMPARISON:  None. FINDINGS: Brain: No acute infarct, mass effect or extra-axial collection. No acute or chronic hemorrhage. There is multifocal hyperintense T2-weighted signal within the white matter. Parenchymal volume and CSF spaces are normal. The midline structures are normal. Vascular: Major flow voids are preserved. Skull and upper cervical spine: Normal calvarium and skull base. Visualized upper cervical spine and soft tissues are normal. Sinuses/Orbits:No paranasal sinus fluid levels or advanced mucosal thickening. No mastoid or middle ear effusion. Normal orbits. IMPRESSION: 1. No acute intracranial abnormality. 2. Findings of mild chronic small vessel ischemic disease. Electronically Signed   By: Ulyses Jarred M.D.   On: 09/26/2020 23:10   ECHOCARDIOGRAM COMPLETE  Result Date: 09/27/2020    ECHOCARDIOGRAM REPORT   Patient Name:   YARNELL ARVIDSON Date of Exam: 09/27/2020 Medical Rec #:  [id number]  Height:       72.0 in Accession #:    [id number]     Weight:       255.1 lb Date of Birth:  1970-12-28      BSA:          2.363 m Patient Age:    26 years       BP:           157/94 mmHg Patient Gender: M              HR:           76 bpm. Exam Location:  Inpatient Procedure: 2D Echo, Color Doppler and Cardiac Doppler Indications:    TIA  History:        Patient has prior history of Echocardiogram examinations, most                 recent 09/19/2020. CHF, CAD; Risk Factors:Hypertension and                 Diabetes.  Sonographer:    Raquel Sarna Senior RDCS Referring Phys: [id number] [place]  1. Abnormal septal motion Distal septal  / apical hypokinesis . Left ventricular ejection fraction, by estimation, is 50 to 55%. The left ventricle has low normal function. The left ventricle demonstrates regional wall motion abnormalities (see scoring diagram/findings for description). The left ventricular internal cavity size was mildly dilated. There is mild left ventricular hypertrophy. Left ventricular diastolic parameters are consistent with Grade II diastolic dysfunction (pseudonormalization). Elevated left ventricular end-diastolic pressure.  2. Right ventricular systolic function is normal. The right ventricular size is normal. There is moderately elevated pulmonary artery systolic pressure.  3. Left atrial size was moderately dilated.  4. The pericardial effusion is posterior to the left ventricle.  5. Carpentier class 3B restricted posterior leaflet motion . The mitral valve is abnormal. Moderate to severe mitral valve regurgitation. No evidence of mitral stenosis.  6. The aortic valve is tricuspid. Aortic valve regurgitation is mild. No aortic stenosis is present.  7. The inferior vena cava is normal in size with greater than 50% respiratory variability, suggesting right atrial pressure of 3 mmHg. FINDINGS  Left Ventricle: Abnormal septal motion Distal septal / apical hypokinesis. Left ventricular ejection fraction, by estimation, is 50 to 55%. The left ventricle has low normal function. The left ventricle demonstrates regional wall motion abnormalities. The left ventricular internal cavity size was mildly dilated. There is mild left ventricular hypertrophy. Left ventricular diastolic parameters are consistent with Grade II diastolic dysfunction (pseudonormalization). Elevated left ventricular end-diastolic pressure. Right Ventricle: The right ventricular size is normal. No increase in right ventricular wall thickness. Right ventricular systolic function is normal. There is moderately elevated pulmonary artery systolic pressure. The tricuspid  regurgitant velocity is 3.16 m/s, and with an assumed right atrial pressure of 8 mmHg, the estimated right ventricular systolic pressure is 24.2 mmHg. Left Atrium: Left atrial size was moderately dilated. Right Atrium: Right atrial size was normal in size. Pericardium: Trivial pericardial effusion is present. The pericardial effusion is posterior to the left ventricle. Mitral Valve: Carpentier class 3B restricted posterior leaflet motion. The mitral valve is abnormal. There is moderate thickening of the mitral valve leaflet(s). There is mild calcification of the mitral valve leaflet(s). Moderate to severe mitral valve regurgitation. No evidence of mitral valve stenosis. Tricuspid Valve: The tricuspid valve is normal in structure. Tricuspid valve regurgitation is mild . No evidence of tricuspid stenosis. Aortic Valve: The aortic  valve is tricuspid. Aortic valve regurgitation is mild. Aortic regurgitation PHT measures 508 msec. No aortic stenosis is present. Pulmonic Valve: The pulmonic valve was normal in structure. Pulmonic valve regurgitation is not visualized. No evidence of pulmonic stenosis. Aorta: The aortic root is normal in size and structure. Venous: The inferior vena cava is normal in size with greater than 50% respiratory variability, suggesting right atrial pressure of 3 mmHg. IAS/Shunts: No atrial level shunt detected by color flow Doppler.  LEFT VENTRICLE PLAX 2D LVIDd:         6.00 cm  Diastology LVIDs:         4.60 cm  LV e' medial:    7.18 cm/s LV PW:         0.90 cm  LV E/e' medial:  17.7 LV IVS:        1.30 cm  LV e' lateral:   8.27 cm/s LVOT diam:     2.20 cm  LV E/e' lateral: 15.4 LV SV:         77 LV SV Index:   33 LVOT Area:     3.80 cm  RIGHT VENTRICLE RV S prime:     10.90 cm/s TAPSE (M-mode): 1.9 cm LEFT ATRIUM              Index       RIGHT ATRIUM           Index LA diam:        4.70 cm  1.99 cm/m  RA Area:     20.90 cm LA Vol (A2C):   112.4 ml 47.60 ml/m RA Volume:   56.10 ml  23.75  ml/m LA Vol (A4C):   119.0 ml 50.37 ml/m LA Biplane Vol: 138.0 ml 58.41 ml/m  AORTIC VALVE LVOT Vmax:   105.00 cm/s LVOT Vmean:  72.000 cm/s LVOT VTI:    0.202 m AI PHT:      508 msec  AORTA Ao Root diam: 3.40 cm Ao Asc diam:  3.70 cm MITRAL VALVE                TRICUSPID VALVE MV Area (PHT): 3.31 cm     TR Peak grad:   39.9 mmHg MV Decel Time: 229 msec     TR Vmax:        316.00 cm/s MR PISA:        4.02 cm MR PISA Radius: 0.80 cm     SHUNTS MV E velocity: 127.00 cm/s  Systemic VTI:  0.20 m MV A velocity: 56.80 cm/s   Systemic Diam: 2.20 cm MV E/A ratio:  2.24 Jenkins Rouge MD Electronically signed by Jenkins Rouge MD Signature Date/Time: 09/27/2020/1:41:25 PM    Final    ECHOCARDIOGRAM STRESS TEST  Result Date: 09/19/2020     DOBUTAMINE STRESS ECHO REPORT    -------------------------------------------------------------------------------- Patient Name:   QUINTEN ALLERTON Date of Exam: 09/19/2020 Medical Rec #:  [id number]      Height:       72.0 in Accession #:    [id number]     Weight:       250.0 lb Date of Birth:  September 22, 1970      BSA:          2.343 m Patient Age:    49 years       BP:           120/55 mmHg Patient Gender: M  HR:           69 bpm. Exam Location:  [address] Procedure: Dobutamine Stress Indications:    Z01.810 Pre-operative cardiovascular examination. R06.02                 Shortness of breath  History:        Patient has prior history of Echocardiogram examinations, most                 recent 11/23/2019.  Sonographer:    Jessee Avers, RDCS Referring Phys: [place]  1. This is a negative stress echocardiogram for ischemia.  2. This is a low risk study. FINDINGS Exam Protocol: Dobutamine was infused in increasing doses starting with 10 mcg/kg/min and ending with 40 mcg/kg/min. A total of 0.5 mg of Atropine was given during the dobutamine infusion. Handgrip exercises were performed during the dobutamine infusion to augment the heart rate response.  Patient  Performance: The heart rate at peak stress was 142 bpm. The target heart rate was calculated to be 144 bpm. The percentage of maximum predicted heart rate achieved was 84.0 %. The baseline blood pressure was 120/55 mmHg. The blood pressure at peak stress was 203/55 mmHg. The patient developed Chest heaviness/tighness 8/10 during the stress exam.  EKG: Resting EKG showed normal sinus rhythm with no abnormal findings. The patient developed no abnormal EKG findings and occasional PAC during exercise.  2D Echo Findings: The baseline ejection fraction was 60%. The peak ejection fraction at stress was 80%. Baseline regional wall motion abnormalities were not present. There were no stress-induced wall motion abnormalities. This is a negative stress echocardiogram for ischemia.  Lyman Bishop MD Electronically signed on 09/19/2020 at 5:40:34 PM    Final    VAS US CAROTID (at [facility] and [place] only)  Result Date: 09/27/2020 Carotid Arterial Duplex Study Indications:       TIA and Left hand weakness and LUE numbness. Risk Factors:      Hypertension, Diabetes, past history of smoking, prior MI,                    coronary artery disease. Other Factors:     CHF, ESRD,. Comparison Study:  No previous exams. Performing Technologist: Rogelia Rohrer  Examination Guidelines: A complete evaluation includes B-mode imaging, spectral Doppler, color Doppler, and power Doppler as needed of all accessible portions of each vessel. Bilateral testing is considered an integral part of a complete examination. Limited examinations for reoccurring indications may be performed as noted.  Right Carotid Findings: +----------+--------+--------+--------+------------------+--------+           PSV cm/sEDV cm/sStenosisPlaque DescriptionComments +----------+--------+--------+--------+------------------+--------+ CCA Prox  74      17                                         +----------+--------+--------+--------+------------------+--------+ CCA Distal76       18                                         +----------+--------+--------+--------+------------------+--------+ ICA Prox  58      19                                         +----------+--------+--------+--------+------------------+--------+  ICA Distal55      19                                         +----------+--------+--------+--------+------------------+--------+ ECA       108     11                                         +----------+--------+--------+--------+------------------+--------+ +----------+--------+-------+--------------+-------------------+           PSV cm/sEDV cmsDescribe      Arm Pressure (mmHG) +----------+--------+-------+--------------+-------------------+ Subclavian               Not visualized                    +----------+--------+-------+--------------+-------------------+ +---------+--------+--+--------+--+---------+ VertebralPSV cm/s51EDV cm/s16Antegrade +---------+--------+--+--------+--+---------+  Left Carotid Findings: +----------+--------+--------+--------+------------------+-------------------+           PSV cm/sEDV cm/sStenosisPlaque DescriptionComments            +----------+--------+--------+--------+------------------+-------------------+ CCA Prox  76      15                                intimal thickening  +----------+--------+--------+--------+------------------+-------------------+ CCA Distal81      15                                intimal thickening  +----------+--------+--------+--------+------------------+-------------------+ ICA Prox  53      17                                                    +----------+--------+--------+--------+------------------+-------------------+ ICA Distal45      17                                not well visualized +----------+--------+--------+--------+------------------+-------------------+ ECA       85      6                                                      +----------+--------+--------+--------+------------------+-------------------+ +----------+--------+--------+--------+-------------------+           PSV cm/sEDV cm/sDescribeArm Pressure (mmHG) +----------+--------+--------+--------+-------------------+ Subclavian152     63                                  +----------+--------+--------+--------+-------------------+ +---------+--------+--+--------+--+---------+ VertebralPSV cm/s42EDV cm/s14Antegrade +---------+--------+--+--------+--+---------+ Previous DIA placement in LUE.  Summary: Right Carotid: The extracranial vessels were near-normal with only minimal wall                thickening or plaque. Left Carotid: The extracranial vessels were near-normal with only minimal wall               thickening or plaque. Vertebrals:  Bilateral vertebral arteries demonstrate antegrade flow. Subclavians: Left subclavian artery was not  visualized. *See table(s) above for measurements and observations.     Preliminary    US Abdomen Limited RUQ (LIVER/GB)  Result Date: 09/26/2020 CLINICAL DATA:  Right upper quadrant pain, discomfort EXAM: ULTRASOUND ABDOMEN LIMITED RIGHT UPPER QUADRANT COMPARISON:  CT 06/09/2020, renal ultrasound 07/12/2019 FINDINGS: Gallbladder: Hyperdense, shadowing biliary material likely reflecting an admixture of tiny stones and biliary sludge. Mild irregular gallbladder wall thickening without pericholecystic fluid. Sonographic Percell Miller sign is reportedly negative. Common bile duct: Diameter: 4.2 mm, nondilated Liver: Slightly lobular hepatic surface contour with mild heterogeneity of the hepatic parenchyma. No intrahepatic biliary ductal dilatation. No concerning focal hepatic lesion. Portal vein is patent on color Doppler imaging with normal direction of blood flow towards the liver. Other: None. IMPRESSION: 1. Hyperdense, shadowing biliary material likely reflecting an admixture of tiny stones and biliary sludge with  gallbladder wall thickening, somewhat equivocal for cholecystitis given concomitant findings in liver and the absence of a sonographic Murphy sign though could reflect an early or developing acute cholecystitis in the appropriate clinical context. If further imaging evaluation is required, consider HIDA. 2. Slightly lobular hepatic surface contour and parenchymal heterogeneity, can be seen in the setting of cirrhosis/intrinsic liver disease. Correlate with patient history and LFTs. Electronically Signed   By: Lovena Le M.D.   On: 09/26/2020 21:02    Microbiology: Recent Results (from the past 240 hour(s))  Resp Panel by RT-PCR (Flu A&B, Covid) Nasopharyngeal Swab     Status: None   Collection Time: 09/26/20  8:28 PM   Specimen: Nasopharyngeal Swab; Nasopharyngeal(NP) swabs in vial transport medium  Result Value Ref Range Status   SARS Coronavirus 2 by RT PCR NEGATIVE NEGATIVE Final    Comment: (NOTE) SARS-CoV-2 target nucleic acids are NOT DETECTED.  The SARS-CoV-2 RNA is generally detectable in upper respiratory specimens during the acute phase of infection. The lowest concentration of SARS-CoV-2 viral copies this assay can detect is 138 copies/mL. A negative result does not preclude SARS-Cov-2 infection and should not be used as the sole basis for treatment or other patient management decisions. A negative result may occur with  improper specimen collection/handling, submission of specimen other than nasopharyngeal swab, presence of viral mutation(s) within the areas targeted by this assay, and inadequate number of viral copies(<138 copies/mL). A negative result must be combined with clinical observations, patient history, and epidemiological information. The expected result is Negative.  Fact Sheet for Patients:  EntrepreneurPulse.com.au  Fact Sheet for Healthcare Providers:  IncredibleEmployment.be  This test is no t yet approved or cleared by  the [place] FDA and  has been authorized for detection and/or diagnosis of SARS-CoV-2 by FDA under an Emergency Use Authorization (EUA). This EUA will remain  in effect (meaning this test can be used) for the duration of the COVID-19 declaration under Section 564(b)(1) of the Act, 21 U.S.C.section 360bbb-3(b)(1), unless the authorization is terminated  or revoked sooner.       Influenza A by PCR NEGATIVE NEGATIVE Final   Influenza B by PCR NEGATIVE NEGATIVE Final    Comment: (NOTE) The Xpert Xpress SARS-CoV-2/FLU/RSV plus assay is intended as an aid in the diagnosis of influenza from Nasopharyngeal swab specimens and should not be used as a sole basis for treatment. Nasal washings and aspirates are unacceptable for Xpert Xpress SARS-CoV-2/FLU/RSV testing.  Fact Sheet for Patients: EntrepreneurPulse.com.au  Fact Sheet for Healthcare Providers: IncredibleEmployment.be  This test is not yet approved or cleared by the [place] FDA and has been authorized for detection and/or  diagnosis of SARS-CoV-2 by FDA under an Emergency Use Authorization (EUA). This EUA will remain in effect (meaning this test can be used) for the duration of the COVID-19 declaration under Section 564(b)(1) of the Act, 21 U.S.C. section 360bbb-3(b)(1), unless the authorization is terminated or revoked.  Performed at [facility], [place] [address] [place], [place] [address]   Urine culture     Status: Abnormal   Collection Time: 09/26/20  9:54 PM   Specimen: Urine, Random  Result Value Ref Range Status   Specimen Description URINE, RANDOM  Final   Special Requests NONE  Final   Culture (A)  Final    <10,000 COLONIES/mL INSIGNIFICANT GROWTH Performed at [facility], [place] [address], [place], [place] [address]    Report Status 09/28/2020 FINAL  Final     Labs: CBC: Recent Labs  Lab 09/26/20 1900 09/27/20 0205 09/27/20 0452 09/28/20 0340  09/29/20 0305  WBC 10.0 8.0 7.4 6.4 6.5  NEUTROABS 8.3*  --   --   --   --   HGB 9.9* 9.1* 9.8* 9.6* 9.4*  HCT 31.6* 30.2* 30.5* 30.7* 30.1*  MCV 89.5 91.8 91.0 90.3 89.9  PLT 174 162 183 185 802   Basic Metabolic Panel: Recent Labs  Lab 09/26/20 1748 09/26/20 1900 09/27/20 0205 09/27/20 0452 09/28/20 0340 09/29/20 0305  NA 135 135  --  136 134* 136  K 4.6 4.6  --  4.6 4.8 4.3  CL 94* 93*  --  96* 97* 99  CO2  --  28  --  22 22 25   GLUCOSE 321* 306*  --  265* 375* 260*  BUN 45* 45*  --  57* 69* 49*  CREATININE 7.30* 7.00* 7.86* 8.06* 9.12* 7.46*  CALCIUM  --  8.4*  --  8.4* 8.9 8.2*  MG  --   --   --   --  2.3 2.1   Liver Function Tests: Recent Labs  Lab 09/26/20 1900 09/27/20 0452 09/28/20 0340 09/29/20 0305  AST 807* 549* 278* 147*  ALT 1,154* 1,083* 908* 683*  ALKPHOS 106 123 126 107  BILITOT 2.2* 1.7* 1.0 0.9  PROT 6.8 6.7 6.6 6.5  ALBUMIN 3.3* 3.2* 3.1* 2.9*   CBG: Recent Labs  Lab 09/28/20 0607 09/28/20 1125 09/28/20 1554 09/28/20 2130 09/29/20 0611  GLUCAP 319* 150* 301* 286* 285*    Time spent: 35 minutes  Signed:  Berle Mull  [facility] 09/29/2020  6:17 PM

## 2020-09-29 NOTE — TOC Transition Note (Signed)
Transition of Care [facility]) - CM/SW Discharge Note   Patient Details  Name: Edward [name] MRN: [id number] Date of Birth: 1971-03-15  [facility]) CM/SW Contact:  Pollie Friar, RN Phone Number: 09/29/2020, 9:24 AM   Clinical Narrative:    Pt discharging home with self care. No f/u per PT/OT and no DME needs.  No needs per TOC.   Final next level of care: Home/Self Care Barriers to Discharge: No Barriers Identified   Patient Goals and CMS Choice        Discharge Placement                       Discharge Plan and Services                                     Social Determinants of Health (SDOH) Interventions     Readmission Risk Interventions No flowsheet data found.

## 2020-09-29 NOTE — Telephone Encounter (Signed)
Order in epic for lab. Pt scheduled to see Dr. Havery Moros 10/24/20@8 :10am.

## 2020-09-29 NOTE — Progress Notes (Signed)
[facility] Progress Note   Subjective:   Sitting on bedside, eating breakfast. In good spirits, reports he is feeling well today. Denies SOB, CP, palpitations, abdominal pain, nausea and vomiting. Reports no issues with dialysis yesterday.   Objective Vitals:   09/28/20 1937 09/28/20 2341 09/29/20 0418 09/29/20 0830  BP: (!) 144/66 (!) 148/74 (!) 158/93 (!) 157/97  Pulse: 81 78 84 80  Resp: 18 18 18 19   Temp: 98.3 F (36.8 C) 98.4 F (36.9 C) 98 F (36.7 C) 98.2 F (36.8 C)  TempSrc: Oral Oral Oral Oral  SpO2: 100% 100% 96% 100%  Weight:      Height:       Physical Exam General: Well developed male, alert and in NAD Heart: RRR, no murmurs, rubs or gallops Lungs: CTA bilaterally without wheezing, rhonchi or rales Abdomen: Soft, non-tender, non-distended, +BS Extremities: L TMA, trace pretibial edema. R BKA Dialysis Access: AVF + t/b, TDC in R chest  Additional Objective Labs: Basic Metabolic Panel: Recent Labs  Lab 09/27/20 0452 09/28/20 0340 09/29/20 0305  NA 136 134* 136  K 4.6 4.8 4.3  CL 96* 97* 99  CO2 22 22 25   GLUCOSE 265* 375* 260*  BUN 57* 69* 49*  CREATININE 8.06* 9.12* 7.46*  CALCIUM 8.4* 8.9 8.2*   Liver Function Tests: Recent Labs  Lab 09/27/20 0452 09/28/20 0340 09/29/20 0305  AST 549* 278* 147*  ALT 1,083* 908* 683*  ALKPHOS 123 126 107  BILITOT 1.7* 1.0 0.9  PROT 6.7 6.6 6.5  ALBUMIN 3.2* 3.1* 2.9*   Recent Labs  Lab 09/26/20 2005  LIPASE 24   CBC: Recent Labs  Lab 09/26/20 1900 09/27/20 0205 09/27/20 0452 09/28/20 0340 09/29/20 0305  WBC 10.0 8.0 7.4 6.4 6.5  NEUTROABS 8.3*  --   --   --   --   HGB 9.9* 9.1* 9.8* 9.6* 9.4*  HCT 31.6* 30.2* 30.5* 30.7* 30.1*  MCV 89.5 91.8 91.0 90.3 89.9  PLT 174 162 183 185 171   Blood Culture    Component Value Date/Time   SDES URINE, RANDOM 09/26/2020 2154   SPECREQUEST NONE 09/26/2020 2154   CULT (A) 09/26/2020 2154    <10,000 COLONIES/mL INSIGNIFICANT  GROWTH Performed at [place] [address], [place], [place] [address]    REPTSTATUS 09/28/2020 FINAL 09/26/2020 2154   CBG: Recent Labs  Lab 09/28/20 0607 09/28/20 1125 09/28/20 1554 09/28/20 2130 09/29/20 0611  GLUCAP 319* 150* 301* 286* 285*    Studies/Results: ECHOCARDIOGRAM COMPLETE  Result Date: 09/27/2020    ECHOCARDIOGRAM REPORT   Patient Name:   SHADOW SCHEDLER Date of Exam: 09/27/2020 Medical Rec #:  [id number]      Height:       72.0 in Accession #:    [id number]     Weight:       255.1 lb Date of Birth:  [birth year]      BSA:          2.363 m Patient Age:    50 years       BP:           157/94 mmHg Patient Gender: M              HR:           76 bpm. Exam Location:  Inpatient Procedure: 2D Echo, Color Doppler and Cardiac Doppler Indications:    TIA  History:        Patient has prior  history of Echocardiogram examinations, most                 recent 09/19/2020. CHF, CAD; Risk Factors:Hypertension and                 Diabetes.  Sonographer:    Raquel Sarna Senior RDCS Referring Phys: [id number] [place]  1. Abnormal septal motion Distal septal / apical hypokinesis . Left ventricular ejection fraction, by estimation, is 50 to 55%. The left ventricle has low normal function. The left ventricle demonstrates regional wall motion abnormalities (see scoring diagram/findings for description). The left ventricular internal cavity size was mildly dilated. There is mild left ventricular hypertrophy. Left ventricular diastolic parameters are consistent with Grade II diastolic dysfunction (pseudonormalization). Elevated left ventricular end-diastolic pressure.  2. Right ventricular systolic function is normal. The right ventricular size is normal. There is moderately elevated pulmonary artery systolic pressure.  3. Left atrial size was moderately dilated.  4. The pericardial effusion is posterior to the left ventricle.  5. Carpentier class 3B restricted posterior leaflet motion .  The mitral valve is abnormal. Moderate to severe mitral valve regurgitation. No evidence of mitral stenosis.  6. The aortic valve is tricuspid. Aortic valve regurgitation is mild. No aortic stenosis is present.  7. The inferior vena cava is normal in size with greater than 50% respiratory variability, suggesting right atrial pressure of 3 mmHg. FINDINGS  Left Ventricle: Abnormal septal motion Distal septal / apical hypokinesis. Left ventricular ejection fraction, by estimation, is 50 to 55%. The left ventricle has low normal function. The left ventricle demonstrates regional wall motion abnormalities. The left ventricular internal cavity size was mildly dilated. There is mild left ventricular hypertrophy. Left ventricular diastolic parameters are consistent with Grade II diastolic dysfunction (pseudonormalization). Elevated left ventricular end-diastolic pressure. Right Ventricle: The right ventricular size is normal. No increase in right ventricular wall thickness. Right ventricular systolic function is normal. There is moderately elevated pulmonary artery systolic pressure. The tricuspid regurgitant velocity is 3.16 m/s, and with an assumed right atrial pressure of 8 mmHg, the estimated right ventricular systolic pressure is 52.8 mmHg. Left Atrium: Left atrial size was moderately dilated. Right Atrium: Right atrial size was normal in size. Pericardium: Trivial pericardial effusion is present. The pericardial effusion is posterior to the left ventricle. Mitral Valve: Carpentier class 3B restricted posterior leaflet motion. The mitral valve is abnormal. There is moderate thickening of the mitral valve leaflet(s). There is mild calcification of the mitral valve leaflet(s). Moderate to severe mitral valve regurgitation. No evidence of mitral valve stenosis. Tricuspid Valve: The tricuspid valve is normal in structure. Tricuspid valve regurgitation is mild . No evidence of tricuspid stenosis. Aortic Valve: The aortic  valve is tricuspid. Aortic valve regurgitation is mild. Aortic regurgitation PHT measures 508 msec. No aortic stenosis is present. Pulmonic Valve: The pulmonic valve was normal in structure. Pulmonic valve regurgitation is not visualized. No evidence of pulmonic stenosis. Aorta: The aortic root is normal in size and structure. Venous: The inferior vena cava is normal in size with greater than 50% respiratory variability, suggesting right atrial pressure of 3 mmHg. IAS/Shunts: No atrial level shunt detected by color flow Doppler.  LEFT VENTRICLE PLAX 2D LVIDd:         6.00 cm  Diastology LVIDs:         4.60 cm  LV e' medial:    7.18 cm/s LV PW:         0.90 cm  LV E/e'  medial:  17.7 LV IVS:        1.30 cm  LV e' lateral:   8.27 cm/s LVOT diam:     2.20 cm  LV E/e' lateral: 15.4 LV SV:         77 LV SV Index:   33 LVOT Area:     3.80 cm  RIGHT VENTRICLE RV S prime:     10.90 cm/s TAPSE (M-mode): 1.9 cm LEFT ATRIUM              Index       RIGHT ATRIUM           Index LA diam:        4.70 cm  1.99 cm/m  RA Area:     20.90 cm LA Vol (A2C):   112.4 ml 47.60 ml/m RA Volume:   56.10 ml  23.75 ml/m LA Vol (A4C):   119.0 ml 50.37 ml/m LA Biplane Vol: 138.0 ml 58.41 ml/m  AORTIC VALVE LVOT Vmax:   105.00 cm/s LVOT Vmean:  72.000 cm/s LVOT VTI:    0.202 m AI PHT:      508 msec  AORTA Ao Root diam: 3.40 cm Ao Asc diam:  3.70 cm MITRAL VALVE                TRICUSPID VALVE MV Area (PHT): 3.31 cm     TR Peak grad:   39.9 mmHg MV Decel Time: 229 msec     TR Vmax:        316.00 cm/s MR PISA:        4.02 cm MR PISA Radius: 0.80 cm     SHUNTS MV E velocity: 127.00 cm/s  Systemic VTI:  0.20 m MV A velocity: 56.80 cm/s   Systemic Diam: 2.20 cm MV E/A ratio:  2.24 Jenkins Rouge MD Electronically signed by Jenkins Rouge MD Signature Date/Time: 09/27/2020/1:41:25 PM    Final    VAS US CAROTID (at [facility] and [facility] only)  Result Date: 09/27/2020 Carotid Arterial Duplex Study Indications:       TIA and Left hand weakness and LUE numbness.  Risk Factors:      Hypertension, Diabetes, past history of smoking, prior MI,                    coronary artery disease. Other Factors:     CHF, ESRD,. Comparison Study:  No previous exams. Performing Technologist: Rogelia Rohrer  Examination Guidelines: A complete evaluation includes B-mode imaging, spectral Doppler, color Doppler, and power Doppler as needed of all accessible portions of each vessel. Bilateral testing is considered an integral part of a complete examination. Limited examinations for reoccurring indications may be performed as noted.  Right Carotid Findings: +----------+--------+--------+--------+------------------+--------+           PSV cm/sEDV cm/sStenosisPlaque DescriptionComments +----------+--------+--------+--------+------------------+--------+ CCA Prox  74      17                                         +----------+--------+--------+--------+------------------+--------+ CCA Distal76      18                                         +----------+--------+--------+--------+------------------+--------+ ICA Prox  58      19                                         +----------+--------+--------+--------+------------------+--------+  ICA Distal55      19                                         +----------+--------+--------+--------+------------------+--------+ ECA       108     11                                         +----------+--------+--------+--------+------------------+--------+ +----------+--------+-------+--------------+-------------------+           PSV cm/sEDV cmsDescribe      Arm Pressure (mmHG) +----------+--------+-------+--------------+-------------------+ Subclavian               Not visualized                    +----------+--------+-------+--------------+-------------------+ +---------+--------+--+--------+--+---------+ VertebralPSV cm/s51EDV cm/s16Antegrade +---------+--------+--+--------+--+---------+  Left Carotid Findings:  +----------+--------+--------+--------+------------------+-------------------+           PSV cm/sEDV cm/sStenosisPlaque DescriptionComments            +----------+--------+--------+--------+------------------+-------------------+ CCA Prox  76      15                                intimal thickening  +----------+--------+--------+--------+------------------+-------------------+ CCA Distal81      15                                intimal thickening  +----------+--------+--------+--------+------------------+-------------------+ ICA Prox  53      17                                                    +----------+--------+--------+--------+------------------+-------------------+ ICA Distal45      17                                not well visualized +----------+--------+--------+--------+------------------+-------------------+ ECA       85      6                                                     +----------+--------+--------+--------+------------------+-------------------+ +----------+--------+--------+--------+-------------------+           PSV cm/sEDV cm/sDescribeArm Pressure (mmHG) +----------+--------+--------+--------+-------------------+ Subclavian152     63                                  +----------+--------+--------+--------+-------------------+ +---------+--------+--+--------+--+---------+ VertebralPSV cm/s42EDV cm/s14Antegrade +---------+--------+--+--------+--+---------+ Previous DIA placement in LUE.  Summary: Right Carotid: The extracranial vessels were near-normal with only minimal wall                thickening or plaque. Left Carotid: The extracranial vessels were near-normal with only minimal wall               thickening or plaque. Vertebrals:  Bilateral vertebral arteries demonstrate antegrade flow. Subclavians: Left subclavian artery was not  visualized. *See table(s) above for measurements and observations.     Preliminary     Medications:  . aspirin EC  81 mg Oral Daily  . Chlorhexidine Gluconate Cloth  6 each Topical Daily  . [START ON 10/12/2020] darbepoetin (ARANESP) injection - DIALYSIS  60 mcg Intravenous Q Thu-HD  . heparin  5,000 Units Subcutaneous Q8H  . insulin aspart  0-15 Units Subcutaneous TID WC  . insulin aspart  0-5 Units Subcutaneous QHS  . insulin aspart protamine- aspart  30 Units Subcutaneous BID WC  . pantoprazole  40 mg Oral Daily    Dialysis Orders: [place], TTS 4.5 hours  2K 2 CA bath EDW 111 kg 8000 heparin load, 2000 mid heparin Left upper arm AV fistula, presently using, has PermCath Mircera 30 units every 2 weeks last given 09/14/2020 Venofer 100 mg x 5 more treatments   Assessment/Plan: 1. ESRD - Continue dialysis on TTS schedule. Inpatient treatments are currently truncated due to high patient volume. Tolerated HD well. No sx of uremia, K+ controlled. Next HD tomorrow. AVF has been working well. If he is still her on Monday, will ask IR to remove his [place], otherwise can be done outpatient.  2. Elevated LFTs: Cholecystitis ruled out. Hepatitis panel and liver US negative. LFTs trending dow, GI has signed off. 3. Possible TIA- L sided weakness on admission. MRI with no acute infarct. On aspirin 81mg , no statin due to elevated LFTs. Mgt per admitting team 4. Hypertension/volume  -Weight elevated, mild edema on exam and moderate HTN. Continue UF with HD tomorrow as tolerated.  5. Anemia  Of ESRD-Hgb 9.4. Given aranesp 09/28/20. No Fe load for now due to liver issues as above. 6. Metabolic bone disease -Calcium controlled. Restart binder, follow phosphorus.  7. Diabetes mellitus type 2= per admit 8. Nutrition -diet should be renal carb modified when taking p.o.'s protein supplement Nepro with albumin 3.1   Anice Paganini, PA-C 09/29/2020, 9:03 AM  [facility] Pager: [phone]

## 2020-09-30 ENCOUNTER — Telehealth: Payer: Self-pay | Admitting: Physician Assistant

## 2020-09-30 NOTE — Telephone Encounter (Signed)
Transition of care contact from inpatient facility  Date of Discharge: 09/29/20 Date of Contact: 09/30/20 Method of contact: Phone  Attempted to contact patient to discuss transition of care from inpatient admission. Patient did not answer the phone. Message was left on the patient's voicemail with call back instructions.  Anice Paganini, PA-C 09/30/2020, 12:03 PM  [employer]

## 2020-10-02 ENCOUNTER — Encounter: Payer: Medicare Other | Admitting: Gastroenterology

## 2020-10-09 ENCOUNTER — Telehealth: Payer: Self-pay | Admitting: Cardiology

## 2020-10-09 NOTE — Telephone Encounter (Signed)
Copy of medication list sent over to PCP office- via epic fax.  Thank you!

## 2020-10-09 NOTE — Telephone Encounter (Signed)
    Ver[facility] with [facility] is requesting to get a copy of pt's medications list fax to pt's pcp Dr. Berdine Addison

## 2020-10-24 ENCOUNTER — Ambulatory Visit: Payer: Medicare Other | Admitting: Gastroenterology

## 2020-12-13 ENCOUNTER — Ambulatory Visit (INDEPENDENT_AMBULATORY_CARE_PROVIDER_SITE_OTHER): Payer: Medicare Other | Admitting: Gastroenterology

## 2020-12-13 ENCOUNTER — Encounter: Payer: Self-pay | Admitting: Gastroenterology

## 2020-12-13 ENCOUNTER — Other Ambulatory Visit (INDEPENDENT_AMBULATORY_CARE_PROVIDER_SITE_OTHER): Payer: Medicare Other

## 2020-12-13 VITALS — BP 138/80 | HR 76 | Ht 72.0 in | Wt 249.0 lb

## 2020-12-13 DIAGNOSIS — Z1211 Encounter for screening for malignant neoplasm of colon: Secondary | ICD-10-CM | POA: Diagnosis not present

## 2020-12-13 DIAGNOSIS — R748 Abnormal levels of other serum enzymes: Secondary | ICD-10-CM

## 2020-12-13 DIAGNOSIS — R932 Abnormal findings on diagnostic imaging of liver and biliary tract: Secondary | ICD-10-CM

## 2020-12-13 DIAGNOSIS — K5909 Other constipation: Secondary | ICD-10-CM

## 2020-12-13 LAB — HEPATIC FUNCTION PANEL
ALT: 173 U/L — ABNORMAL HIGH (ref 0–53)
AST: 89 U/L — ABNORMAL HIGH (ref 0–37)
Albumin: 4.1 g/dL (ref 3.5–5.2)
Alkaline Phosphatase: 60 U/L (ref 39–117)
Bilirubin, Direct: 0.2 mg/dL (ref 0.0–0.3)
Total Bilirubin: 0.8 mg/dL (ref 0.2–1.2)
Total Protein: 7.7 g/dL (ref 6.0–8.3)

## 2020-12-13 LAB — IBC + FERRITIN
Ferritin: >1500 ng/mL — ABNORMAL HIGH (ref 22.0–322.0)
Iron: 71 ug/dL (ref 42–165)
Saturation Ratios: 30.4 % (ref 20.0–50.0)
Transferrin: 167 mg/dL — ABNORMAL LOW (ref 212.0–360.0)

## 2020-12-13 MED ORDER — LINACLOTIDE 72 MCG PO CAPS: 72.0000 ug | ORAL_CAPSULE | Freq: Every day | ORAL | 0 refills | Status: DC

## 2020-12-13 MED ORDER — LINACLOTIDE 145 MCG PO CAPS: 145.0000 ug | ORAL_CAPSULE | Freq: Every day | ORAL | 0 refills | Status: DC

## 2020-12-13 MED ORDER — SUTAB 1479-225-188 MG PO TABS: 1.0000 | ORAL_TABLET | Freq: Once | ORAL | 0 refills | Status: AC

## 2020-12-13 MED ORDER — POLYETHYLENE GLYCOL 3350 17 G PO PACK: 17.0000 g | PACK | Freq: Every day | ORAL | 0 refills | Status: DC

## 2020-12-13 NOTE — Patient Instructions (Addendum)
If you are age 51 or older, your body mass index should be between 23-30. Your Body mass index is 33.77 kg/m. If this is out of the aforementioned range listed, please consider follow up with your Primary Care Provider.  If you are age 39 or younger, your body mass index should be between 19-25. Your Body mass index is 33.77 kg/m. If this is out of the aformentioned range listed, please consider follow up with your Primary Care Provider.   You have been scheduled for a colonoscopy. Please follow written instructions given to you at your visit today.  Please pick up your prep supplies at the pharmacy within the next 1-3 days. If you use inhalers (even only as needed), please bring them with you on the day of your procedure.   We have given you samples of the following medication to take: Linzess 72 mcg and 145 mcg: Take once daily before breakfast (Try the 72 mcgs first and increase to 145 mcg if needed) OR you can use Miralax - over-the-counter -as directed daily  Please go to the lab in the basement of our building to have lab work done as you leave today. Hit "B" for basement when you get on the elevator.  When the doors open the lab is on your left.  We will call you with the results. Thank you.  Due to recent changes in healthcare laws, you may see the results of your imaging and laboratory studies on MyChart before your provider has had a chance to review them.  We understand that in some cases there may be results that are confusing or concerning to you. Not all laboratory results come back in the same time frame and the provider may be waiting for multiple results in order to interpret others.  Please give [place] 48 hours in order for your provider to thoroughly review all the results before contacting the office for clarification of your results.   Thank you for entrusting me with your care and for choosing [facility], Dr. [place] Cellar

## 2020-12-13 NOTE — Progress Notes (Signed)
HPI : 50 year old male with multiple medical problems including end-stage renal disease on HD, history of CAD, history of CHF, diabetes, hypertension, GERD here for a follow-up visit after hospitalization in February.  I met him during consultation for that hospitalization.  He presented with weakness, nausea vomiting and a syncopal episode, concern for stroke initially.  He had multiple episodes of nausea vomiting, as well as loose stools.  His white cell count was normal, hemoglobin is 8.9 at baseline.  He was found to have a significant transaminitis of ALT into the low 1000's, AST 800s, T bili 2.2, alk phos normal.  Negative brain imaging.  Right upper quadrant ultrasound suggested gallbladder stones and sludge with possible cholecystitis, and question of cirrhosis.  He denies any abdominal pain at all, it was thought it was unlikely he had cholecystitis.  He had no change in medications prior to onset of symptoms.  His INR was elevated to 1.6 initially on admission.  Doppler showed no vascular compromise of the liver.  He denies alcohol use, had not been using Tylenol.  Acute viral hepatitis panel was negative.  We suspect that he has shock liver in the setting of hypotension/syncope.  His liver enzymes improved over the following few days and he was discharged in stable condition.  He has not had his liver enzymes checked since that time.  Upon discharge his ALT was down to 600, AST 100s.  He states he has been feeling well without complaints.  Has not had any recurrence of nausea vomiting or diarrhea.  States his bowels are chronically slow and has a bowel movement once every few days.  He has never had a prior colonoscopy.  No family history of known colon cancer.  No blood in his stools.  He tried using some Linzess in the past which worked okay for him.  He used MiraLAX 1 dose daily in the past which was not strong enough.  He has been using over-the-counter stool softeners as needed.  He has  never had a colonoscopy.  Otherwise there has been a question of cirrhosis on prior imaging.  He has been seen at [facility] for possible renal transplant evaluation.  They performed a FibroScan this past January which suggested there was no evidence of cirrhosis.  His ultrasounds have suggested the possibility, prior CT scan showed no evidence of splenomegaly last year.  His platelet count is normal, his INR returned to normal with monitoring during hospitalization.  He denies any alcohol use currently, previously drank occasionally but nothing significant from what he endorses.   Prior workup: CT abdomen / pelvis without contrast 06/11/20 - IMPRESSION: 1. Bilateral pleural effusions, likely small but incompletely imaged. 2. Hazy opacities within the LEFT lower lobe and lingula, incompletely imaged, asymmetric edema versus atypical pneumonia. 3. Probable hepatic cirrhosis. Normal spleen 4. Moderate to large stool ball in the rectal vault, and moderate amount of stool throughout the remainder of the normal-caliber colon (constipation?). 5. Otherwise, no acute findings within the abdomen or pelvis. No renal or ureteral calculi. No hydronephrosis.   08/2020 Fibroscan - [facility]  Adequate reading obtained yes  Comment:reading had large variability Total number of valid readings:21 Total number of invalid readings:8  CAP: 240 dB/m  Median shear wave speed, Vs (m/s) : 2.35 Median equivalent Liver Stiffness Score, E (kPa) : 16.6 Stiffness IQR (interquartile range), E (kPa) : 7.2 Stiffness IQR/median (%): 43%   09/26/20 RUQ US IMPRESSION: 1. Hyperdense, shadowing biliary material likely reflecting an admixture of  tiny stones and biliary sludge with gallbladder wall thickening, somewhat equivocal for cholecystitis given concomitant findings in liver and the absence of a sonographic Murphy sign though could reflect an early or developing acute cholecystitis in the appropriate clinical context. If  further imaging evaluation is required, consider HIDA. 2. Slightly lobular hepatic surface contour and parenchymal heterogeneity, can be seen in the setting of cirrhosis/intrinsic liver disease. Correlate with patient history and LFTs.   Last labs 09/29/20 - AST 147, ALT 683, AP 107, T bil 0.9  Echo 09/27/20 - apical hypokinesis, EF 50-55%, grade II DD, moderate pulmonary HTN, moderate to severe MR  Echo dobutamine stress 09/19/20 - negative study, low risk    Past Medical History:  Diagnosis Date  . Anemia    getting Venofer with dialysis  . Anxiety    self reported, sometimes-situational anxiety  . Arthritis    bilateral hands/LEFT hip  . CHF (congestive heart failure) (HCC)   . Chronic kidney disease    t th s [facility] [facility]  . Complication of anesthesia    slow to go to sleep  . Coronary artery disease   . Depression    self reported, sometimes-situational depression  . Diabetes mellitus    type II- on meds  . Dyspnea    sometimes- fluid  . GERD (gastroesophageal reflux disease)    on meds  . Hypertension    no longer on medication since starting on Hemodialysis  . Myocardial infarction (HCC) 2019     Past Surgical History:  Procedure Laterality Date  . A/V FISTULAGRAM Left 03/06/2020   Procedure: A/V FISTULAGRAM;  Surgeon: Cain, Brandon Christopher, MD;  Location: [facility];  Service: Cardiovascular;  Laterality: Left;  . AMPUTATION Left 05/10/2016   Procedure: Left Transmetatarsal Amputation;  Surgeon: Marcus V Duda, MD;  Location: [facility];  Service: Orthopedics;  Laterality: Left;  . AMPUTATION Right 05/16/2018   Procedure: PARTIAL RAY AMPUTATION FIFTH TOE RIGHT FOOT;  Surgeon: Evans, Brent M, DPM;  Location: [facility];  Service: Podiatry;  Laterality: Right;  . AMPUTATION Right 06/17/2018   Procedure: AMPUTATION 4th RAY Right Foot;  Surgeon: Wagoner, Matthew R, DPM;  Location: [facility];  Service: Podiatry;  Laterality: Right;  . AMPUTATION Right 06/19/2018    Procedure: RIGHT BELOW KNEE AMPUTATION;  Surgeon: Duda, Marcus V, MD;  Location: [facility];  Service: Orthopedics;  Laterality: Right;  . APPLICATION OF WOUND VAC Right 06/19/2018   Procedure: APPLICATION OF WOUND VAC;  Surgeon: Duda, Marcus V, MD;  Location: [facility];  Service: Orthopedics;  Laterality: Right;  . AV FISTULA PLACEMENT Left 11/25/2019   Procedure: LEFT ARM Brachiocephalic  ARTERIOVENOUS (AV) FISTULA CREATION;  Surgeon: Early, Todd F, MD;  Location: [facility];  Service: Vascular;  Laterality: Left;  . BASCILIC VEIN TRANSPOSITION Left 07/19/2020   Procedure: LEFT SECOND STAGE BASCILIC VEIN TRANSPOSITION;  Surgeon: Cain, Brandon Christopher, MD;  Location: [facility];  Service: Vascular;  Laterality: Left;  . CARDIAC CATHETERIZATION  07/29/2018  . CIRCUMCISION  09/02/2011   Procedure: CIRCUMCISION ADULT;  Surgeon: Daniel Young Woodruff, MD;  Location: [facility];  Service: Urology;  Laterality: N/A;  . CORONARY STENT INTERVENTION N/A 07/29/2018   Procedure: CORONARY STENT INTERVENTION;  Surgeon: Harding, David W, MD;  [facility];  Service: Cardiovascular;  Laterality: N/A;  . I & D EXTREMITY  09/02/2011   Procedure: IRRIGATION AND DEBRIDEMENT EXTREMITY;  Surgeon: Claire Sanger, DO;  Location: [facility];    Service: Clinical cytogeneticist;  Laterality: N/A;  . INCISION AND DRAINAGE OF WOUND  08/12/2011   Procedure: IRRIGATION AND DEBRIDEMENT WOUND;  Surgeon: Zenovia Jarred, MD;  Location: [facility];  Service: General;;  perineum  . INSERTION OF DIALYSIS CATHETER N/A 11/25/2019   Procedure: INSERTION OF Righ Internal Jugular DIALYSIS CATHETER;  Surgeon: Rosetta Posner, MD;  Location: [place];  Service: Vascular;  Laterality: N/A;  . IRRIGATION AND DEBRIDEMENT ABSCESS  08/12/2011   Procedure: IRRIGATION AND DEBRIDEMENT ABSCESS;  Surgeon: Molli Hazard, MD;  Location: [place];  Service: Urology;  Laterality: N/A;  irrigation and debridment perineum    . IRRIGATION AND  DEBRIDEMENT ABSCESS  08/14/2011   Procedure: MINOR INCISION AND DRAINAGE OF ABSCESS;  Surgeon: Molli Hazard, MD;  Location: [place];  Service: Urology;  Laterality: N/A;  Perineal Wound Debridement;Placement Bilateral Testicular Thigh Pouches  . LEFT HEART CATH AND CORONARY ANGIOGRAPHY N/A 07/29/2018   Procedure: LEFT HEART CATH AND CORONARY ANGIOGRAPHY;  Surgeon: Leonie Man, MD;  Location: [facility] [place] [facility];  Service: Cardiovascular;  Laterality: N/A;  . REVISON OF ARTERIOVENOUS FISTULA Left 05/24/2020   Procedure: LEFT ARM ARTERIOVENOUS FISTULA  CONVERSION TO BASILIC VEIN FISTULA;  Surgeon: Waynetta Sandy, MD;  Location: [place];  Service: Vascular;  Laterality: Left;  . TOTAL HIP ARTHROPLASTY Left 10/11/2016   Procedure: LEFT TOTAL HIP ARTHROPLASTY ANTERIOR APPROACH;  Surgeon: Mcarthur Rossetti, MD;  Location: [facility];  Service: Orthopedics;  Laterality: Left;   Family History  Problem Relation Age of Onset  . Diabetes Other   . Pancreatic cancer Mother 57  . Colon cancer Neg Hx   . Colon polyps Neg Hx   . Esophageal cancer Neg Hx   . Rectal cancer Neg Hx   . Stomach cancer Neg Hx    Social History   Tobacco Use  . Smoking status: Former Smoker    Packs/day: 0.25    Years: 24.00    Pack years: 6.00    Types: Cigarettes    Quit date: 11/19/2019    Years since quitting: 1.0  . Smokeless tobacco: Never Used  Vaping Use  . Vaping Use: Never used  Substance Use Topics  . Alcohol use: Not Currently    Alcohol/week: 0.0 standard drinks    Comment: quit 2018  . Drug use: No   Current Outpatient Medications  Medication Sig Dispense Refill  . aspirin EC 81 MG tablet Take 1 tablet (81 mg total) by mouth daily. Swallow whole. 90 tablet 3  . bismuth subsalicylate (PEPTO BISMOL) 262 MG/15ML suspension Take 30 mLs by mouth every 6 (six) hours as needed for indigestion.    . blood glucose meter kit and supplies Dispense based on patient and insurance preference.  Use up to four times daily as directed. (FOR ICD-9 250.00, 250.01). 1 each 0  . cholecalciferol (VITAMIN D3) 25 MCG (1000 UNIT) tablet Take 1,000 Units by mouth Every Tuesday,Thursday,and Saturday with dialysis.     Marland Kitchen doxercalciferol (HECTOROL) 4 MCG/2ML injection Doxercalciferol (Hectorol)    . insulin aspart protamine - aspart (NOVOLOG MIX 70/30 FLEXPEN) (70-30) 100 UNIT/ML FlexPen Inject 0.3 mLs (30 Units total) into the skin 2 (two) times daily with a meal. 15 mL 0  . Insulin Pen Needle 31G X 5 MM MISC Use daily to inject insulin as instructed 100 each 2  . Methoxy PEG-Epoetin Beta (MIRCERA IJ) Mircera    . pantoprazole (PROTONIX) 40 MG tablet Take 40 mg by mouth daily.    Marland Kitchen  VELPHORO 500 MG chewable tablet Chew 500 mg by mouth 3 (three) times daily with meals.     . iron sucrose (VENOFER) 20 MG/ML injection Iron Sucrose (Venofer)     Current Facility-Administered Medications  Medication Dose Route Frequency Provider Last Rate Last Admin  . 0.9 %  sodium chloride infusion  250 mL Intravenous PRN Cain, Brandon Christopher, MD       Allergies  Allergen Reactions  . Crestor [Rosuvastatin] Rash    Also had a red eye     Review of Systems: All systems reviewed and negative except where noted in HPI.   Lab Results  Component Value Date   WBC 6.5 09/29/2020   HGB 9.4 (L) 09/29/2020   HCT 30.1 (L) 09/29/2020   MCV 89.9 09/29/2020   PLT 171 09/29/2020    Lab Results  Component Value Date   CREATININE 7.46 (H) 09/29/2020   BUN 49 (H) 09/29/2020   NA 136 09/29/2020   K 4.3 09/29/2020   CL 99 09/29/2020   CO2 25 09/29/2020    Lab Results  Component Value Date   INR 1.2 09/28/2020   INR 1.3 (H) 09/27/2020   INR 1.6 (H) 09/26/2020    Lab Results  Component Value Date   ALT 683 (H) 09/29/2020   AST 147 (H) 09/29/2020   ALKPHOS 107 09/29/2020   BILITOT 0.9 09/29/2020     Physical Exam: BP 138/80   Pulse 76   Ht 6' (1.829 m)   Wt 249 lb (112.9 kg)   BMI 33.77 kg/m   Constitutional: Pleasant,well-developed, male in no acute distress.  Cardiovascular: Normal rate, regular rhythm.  Pulmonary/chest: Effort normal and breath sounds normal.  Abdominal: Soft, nondistended, nontender. . There are no masses palpable.  Extremities: no edema, R lower leg prosthesis Lymphadenopathy: No cervical adenopathy noted. Neurological: Alert and oriented to person place and time. Skin: Skin is warm and dry. No rashes noted. Psychiatric: Normal mood and affect. Behavior is normal.   ASSESSMENT AND PLAN: 50-year-old male here for reassessment of the following:  Elevated liver enzymes Abnormal liver imaging Chronic constipation Colon cancer screening  As above, the patient was admitted to the hospital with nausea vomiting which led to syncopal episode.  Found to have markedly elevated AST and ALT.  Imaging did not show any vascular compromise.  Suspect he very likely had shock liver associated with syncopal episode/hypotension which improved with conservative measures.  There is a question of possible underlying cirrhosis of the liver based off prior CT scan and ultrasound findings, however his platelet count is normal, his spleen is normal on imaging, and he had FibroScan at [facility] in January arguing against fibrotic change.  We discussed that liver biopsy would be the most accurate way to determine if he has cirrhosis however do not think we need to proceed with that right now.  If he does have cirrhosis it would appear to be mild/compensated.  We discussed that he needs to have this monitored moving forward.  I would like to recheck his liver enzymes now to make sure stable, will also check TIBC/ferritin and autoimmune markers in regards to ruling out other causes of chronic liver disease.  He has been negative for viral hepatitis.  He does agreement with this.  Assuming his liver enzymes have normalized, would monitor this moving forward, consider another ultrasound in 6 to 12  months.  Otherwise he has chronic constipation, we discussed options.  He can try using higher dose MiraLAX which   he has not tried in the past, and I have also provided him some samples of Linzess dosed at 145 mcg a day as well as 72-1/2 mcg a day, he can try both and see which works best for him.  Happy to provide prescription for either one if he finds it unofficial.  Otherwise he is due for screening colonoscopy.  He denies any exertional or cardiopulmonary symptoms at this time.  Cardiac work-up as above.  This will be done on a Wednesday or Friday to accommodate his dialysis schedule.  Further recommendations pending the results.  Plan: - LFTs today, also check serologies to rule out causes of chronic liver disease in case he has underlying cirrhosis - TIBC / ferritin, IgG, SMA, alpha one antitrypsin, ceruloplasmin (prior ANA negative and viral workup negative) - consider interval RUQ US in 6-12 months - samples of Linzess provided, will try this or can use Miralax BID and titrate up or down, whichever regimen works best for him - screening colonoscopy scheduled   , MD [facility]   

## 2020-12-15 ENCOUNTER — Ambulatory Visit (HOSPITAL_COMMUNITY)
Admission: EM | Admit: 2020-12-15 | Discharge: 2020-12-15 | Disposition: A | Discharge status: Home / Self Care | Payer: Medicare Other | Attending: Medical Oncology | Admitting: Medical Oncology

## 2020-12-15 ENCOUNTER — Encounter (HOSPITAL_COMMUNITY): Payer: Self-pay

## 2020-12-15 ENCOUNTER — Ambulatory Visit (INDEPENDENT_AMBULATORY_CARE_PROVIDER_SITE_OTHER): Payer: Medicare Other

## 2020-12-15 ENCOUNTER — Other Ambulatory Visit: Payer: Self-pay

## 2020-12-15 DIAGNOSIS — S91312A Laceration without foreign body, left foot, initial encounter: Secondary | ICD-10-CM | POA: Diagnosis not present

## 2020-12-15 DIAGNOSIS — X58XXXA Exposure to other specified factors, initial encounter: Secondary | ICD-10-CM | POA: Diagnosis not present

## 2020-12-15 DIAGNOSIS — S99922A Unspecified injury of left foot, initial encounter: Secondary | ICD-10-CM | POA: Diagnosis not present

## 2020-12-15 DIAGNOSIS — Z789 Other specified health status: Secondary | ICD-10-CM

## 2020-12-15 MED ORDER — DOXYCYCLINE HYCLATE 100 MG PO CAPS: 100.0000 mg | ORAL_CAPSULE | Freq: Two times a day (BID) | ORAL | 0 refills | Status: DC

## 2020-12-15 NOTE — ED Provider Notes (Addendum)
[place]    CSN: [id number] Arrival date & time: 12/15/20  0950      History   Chief Complaint Chief Complaint  Patient presents with  . Foot Injury    HPI Edward [name] is a 50 y.o. male.   HPI   Foot Injury: Patient states that he woke up this morning and did his normal foot checks which were normal.  He states that he went outside to let his dog out and talk with his neighbor.  He states that his dog got hung up on something on his leash so he was in and out of the house trying to fix that.  He then noticed that he saw some blood on the ground and thought that it might have been his dog.  He looked over his dog and found no evidence of blood.  He called his sons to see if they could see where the source of blood was.  They noticed that patient's sock was bloody and upon closer inspection they realized that the blood was coming from his foot.  No known injury although patient does not have any sensation of his feet.  With gentle pressure the bleeding has stopped.  Given his history of diabetes, polyneuropathy he presents today to ensure that he does not need any further management.   Past Medical History:  Diagnosis Date  . Anemia    getting Venofer with dialysis  . Anxiety    self reported, sometimes-situational anxiety  . Arthritis    bilateral hands/LEFT hip  . CHF (congestive heart failure) ([place])   . Chronic kidney disease    t th s dialysis- [place] ---ESRD  . Complication of anesthesia    slow to go to sleep  . Coronary artery disease   . Depression    self reported, sometimes-situational depression  . Diabetes mellitus    type II- on meds  . Dyspnea    sometimes- fluid  . GERD (gastroesophageal reflux disease)    on meds  . Hypertension    no longer on medication since starting on Hemodialysis  . Myocardial infarction [facility]) 2019    Patient Active Problem List   Diagnosis Date Noted  . Acute cholecystitis 09/27/2020  . Elevated LFTs 09/27/2020   . TIA (transient ischemic attack) 09/27/2020  . Nausea and vomiting   . ESRD on hemodialysis ([place]) 06/22/2020  . S/P coronary artery stent placement 06/22/2020  . Encounter for removal of sutures 03/07/2020  . Fluid overload, unspecified 12/29/2019  . Hypertensive chronic kidney disease with stage 1 through stage 4 chronic kidney disease, or unspecified chronic kidney disease 11/30/2019  . Pruritus, unspecified 11/30/2019  . Secondary hyperparathyroidism of renal origin ([place]) 11/30/2019  . Unspecified atrial fibrillation ([place]) 11/30/2019  . Pain, unspecified 11/30/2019  . Other specified arthritis, unspecified site 11/30/2019  . Iron deficiency anemia, unspecified 11/30/2019  . End stage renal disease ([place]) 11/30/2019  . Encounter for screening for respiratory tuberculosis 11/30/2019  . Diarrhea, unspecified 11/30/2019  . Coagulation defect, unspecified ([place]) 11/30/2019  . Atherosclerotic heart disease of native coronary artery without angina pectoris 11/30/2019  . Acute renal failure (ARF) ([place]) 11/22/2019  . Transaminitis 12/22/2018  . Acute exacerbation of CHF (congestive heart failure) ([place]) 12/21/2018  . Gait abnormality 09/11/2018  . CAD S/P percutaneous coronary angioplasty 08/06/2018  . Cardiomyopathy ([place]) 08/06/2018  . PVD (peripheral vascular disease) ([place]) 08/06/2018  . Shortness of breath   . Chronic combined systolic and diastolic CHF (congestive  heart failure) ([place])   . History of NSTEMI 07/28/2018  . Respiratory failure with hypoxia ([place]) 07/28/2018  . Acute heart failure ([place]) 07/28/2018  . Hypertensive emergency 07/28/2018  . S/P BKA (below knee amputation) unilateral, right ([place]) 07/15/2018  . Hypoglycemia   . Anxiety about health   . Labile blood glucose   . Essential hypertension   . History of transmetatarsal amputation of left foot ([place])   . Hypoalbuminemia due to protein-calorie malnutrition ([place])   . Poorly controlled type 2 diabetes mellitus with  peripheral neuropathy ([place])   . Acute blood loss anemia   . Anemia of chronic disease   . Leukocytosis   . Tobacco abuse   . Hyperkalemia 06/20/2018  . Diabetic polyneuropathy associated with type 2 diabetes mellitus ([place])   . Severe protein-calorie malnutrition ([place])   . AKI (acute kidney injury) ([place]) 06/17/2018  . Normocytic anemia 06/17/2018  . Viral pharyngitis 01/14/2017  . Cigarette smoker 01/14/2017  . Avascular necrosis of hip, left ([place]) 10/11/2016  . Status post left hip replacement 10/11/2016  . S/P transmetatarsal amputation of foot, left ([place]) 07/25/2016  . Balance problem 07/25/2016  . Fournier's gangrene 08/16/2011  . Acute respiratory failure ([place]) 08/16/2011  . Septic shock(785.52) 08/16/2011  . Diabetes mellitus with complication, with long-term current use of insulin ([place]) 08/16/2011    Past Surgical History:  Procedure Laterality Date  . A/V FISTULAGRAM Left 03/06/2020   Procedure: A/V FISTULAGRAM;  Surgeon: Waynetta Sandy, MD;  Location: [place] [facility];  Service: Cardiovascular;  Laterality: Left;  . AMPUTATION Left 05/10/2016   Procedure: Left Transmetatarsal Amputation;  Surgeon: [name], MD;  Location: [place];  Service: Orthopedics;  Laterality: Left;  . AMPUTATION Right 05/16/2018   Procedure: PARTIAL RAY AMPUTATION FIFTH TOE RIGHT FOOT;  Surgeon: [name], DPM;  Location: [place];  Service: Podiatry;  Laterality: Right;  . AMPUTATION Right 06/17/2018   Procedure: AMPUTATION 4th RAY Right Foot;  Surgeon: Trula Slade, DPM;  Location: [place];  Service: Podiatry;  Laterality: Right;  . AMPUTATION Right 06/19/2018   Procedure: RIGHT BELOW KNEE AMPUTATION;  Surgeon: [name], MD;  Location: [place];  Service: Orthopedics;  Laterality: Right;  . APPLICATION OF WOUND VAC Right 06/19/2018   Procedure: APPLICATION OF WOUND VAC;  Surgeon: [name], MD;  Location: [place];  Service: Orthopedics;  Laterality: Right;  . AV FISTULA  PLACEMENT Left 11/25/2019   Procedure: LEFT ARM Brachiocephalic  ARTERIOVENOUS (AV) FISTULA CREATION;  Surgeon: Rosetta Posner, MD;  Location: [place];  Service: Vascular;  Laterality: Left;  . BASCILIC VEIN TRANSPOSITION Left 07/19/2020   Procedure: LEFT SECOND STAGE BASCILIC VEIN TRANSPOSITION;  Surgeon: Waynetta Sandy, MD;  Location: [place];  Service: Vascular;  Laterality: Left;  . CARDIAC CATHETERIZATION  07/29/2018  . CIRCUMCISION  09/02/2011   Procedure: CIRCUMCISION ADULT;  Surgeon: Molli Hazard, MD;  Location: [place];  Service: Urology;  Laterality: N/A;  . CORONARY STENT INTERVENTION N/A 07/29/2018   Procedure: CORONARY STENT INTERVENTION;  Surgeon: Leonie Man, MD;  Location: [place] [facility];  Service: Cardiovascular;  Laterality: N/A;  . I & D EXTREMITY  09/02/2011   Procedure: IRRIGATION AND DEBRIDEMENT EXTREMITY;  Surgeon: Theodoro Kos, DO;  Location: [place];  Service: Plastics;  Laterality: N/A;  . INCISION AND DRAINAGE OF WOUND  08/12/2011   Procedure: IRRIGATION AND DEBRIDEMENT WOUND;  Surgeon: Zenovia Jarred, MD;  Location: [facility];  Service: General;;  perineum  . INSERTION OF DIALYSIS CATHETER N/A 11/25/2019   Procedure: INSERTION OF Righ Internal Jugular DIALYSIS CATHETER;  Surgeon: Rosetta Posner, MD;  Location: [place];  Service: Vascular;  Laterality: N/A;  . IRRIGATION AND DEBRIDEMENT ABSCESS  08/12/2011   Procedure: IRRIGATION AND DEBRIDEMENT ABSCESS;  Surgeon: Molli Hazard, MD;  Location: [place];  Service: Urology;  Laterality: N/A;  irrigation and debridment perineum    . IRRIGATION AND DEBRIDEMENT ABSCESS  08/14/2011   Procedure: MINOR INCISION AND DRAINAGE OF ABSCESS;  Surgeon: Molli Hazard, MD;  Location: [place];  Service: Urology;  Laterality: N/A;  Perineal Wound Debridement;Placement Bilateral Testicular Thigh Pouches  . LEFT HEART CATH AND CORONARY ANGIOGRAPHY N/A 07/29/2018   Procedure: LEFT  HEART CATH AND CORONARY ANGIOGRAPHY;  Surgeon: Leonie Man, MD;  Location: [place] [facility];  Service: Cardiovascular;  Laterality: N/A;  . REVISON OF ARTERIOVENOUS FISTULA Left 05/24/2020   Procedure: LEFT ARM ARTERIOVENOUS FISTULA  CONVERSION TO BASILIC VEIN FISTULA;  Surgeon: Waynetta Sandy, MD;  Location: [place];  Service: Vascular;  Laterality: Left;  . TOTAL HIP ARTHROPLASTY Left 10/11/2016   Procedure: LEFT TOTAL HIP ARTHROPLASTY ANTERIOR APPROACH;  Surgeon: Mcarthur Rossetti, MD;  Location: [facility];  Service: Orthopedics;  Laterality: Left;       Home Medications    Prior to Admission medications   Medication Sig Start Date End Date Taking? Authorizing Provider  aspirin EC 81 MG tablet Take 1 tablet (81 mg total) by mouth daily. Swallow whole. 03/14/20   Lelon Perla, MD  bismuth subsalicylate (PEPTO BISMOL) 262 MG/15ML suspension Take 30 mLs by mouth every 6 (six) hours as needed for indigestion.    [provider]  blood glucose meter kit and supplies Dispense based on patient and insurance preference. Use up to four times daily as directed. (FOR ICD-9 250.00, 250.01). 07/01/18   Angiulli, Lavon Paganini, PA-C  cholecalciferol (VITAMIN D3) 25 MCG (1000 UNIT) tablet Take 1,000 Units by mouth Every Tuesday,Thursday,and Saturday with dialysis.     [provider]  doxercalciferol (HECTOROL) 4 MCG/2ML injection Doxercalciferol (Hectorol) 07/22/20 07/21/21  [provider]  insulin aspart protamine - aspart (NOVOLOG MIX 70/30 FLEXPEN) (70-30) 100 UNIT/ML FlexPen Inject 0.3 mLs (30 Units total) into the skin 2 (two) times daily with a meal. 09/29/20   Lavina Hamman, MD  Insulin Pen Needle 31G X 5 MM MISC Use daily to inject insulin as instructed 07/01/18   Angiulli, Lavon Paganini, PA-C  iron sucrose (VENOFER) 20 MG/ML injection Iron Sucrose (Venofer) 09/14/20 10/05/20  [provider]  linaclotide Rolan Lipa) 145 MCG CAPS capsule Take 1 capsule  (145 mcg total) by mouth daily before breakfast. 12/13/20   Armbruster, Carlota Raspberry, MD  linaclotide (LINZESS) 72 MCG capsule Take 1 capsule (72 mcg total) by mouth daily before breakfast. 12/13/20   Armbruster, Carlota Raspberry, MD  Methoxy PEG-Epoetin Beta (MIRCERA IJ) Mircera 08/31/20 08/30/21  [provider]  pantoprazole (PROTONIX) 40 MG tablet Take 40 mg by mouth daily. 08/16/20   [provider]  polyethylene glycol (MIRALAX) 17 g packet Take 17 g by mouth daily. 12/13/20   Armbruster, Carlota Raspberry, MD  VELPHORO 500 MG chewable tablet Chew 500 mg by mouth 3 (three) times daily with meals.  06/14/20   [provider]    Family History Family History  Problem Relation Age of Onset  . Diabetes Other   . Pancreatic  cancer Mother 65  . Colon cancer Neg Hx   . Colon polyps Neg Hx   . Esophageal cancer Neg Hx   . Rectal cancer Neg Hx   . Stomach cancer Neg Hx     Social History Social History   Tobacco Use  . Smoking status: Former Smoker    Packs/day: 0.25    Years: 24.00    Pack years: 6.00    Types: Cigarettes    Quit date: 11/19/2019    Years since quitting: 1.0  . Smokeless tobacco: Never Used  Vaping Use  . Vaping Use: Never used  Substance Use Topics  . Alcohol use: Not Currently    Alcohol/week: 0.0 standard drinks    Comment: quit 2018  . Drug use: No     Allergies   Crestor [rosuvastatin]   Review of Systems Review of Systems  As stated above in HPI Physical Exam Triage Vital Signs ED Triage Vitals  Enc Vitals Group     BP 12/15/20 1011 (S) (!) 158/84     Pulse Rate 12/15/20 1011 80     Resp 12/15/20 1011 16     Temp 12/15/20 1011 97.8 F (36.6 C)     Temp Source 12/15/20 1011 Oral     SpO2 12/15/20 1011 100 %     Weight 12/15/20 1009 248 lb (112.5 kg)     Height --      Head Circumference --      Peak Flow --      Pain Score 12/15/20 1009 0     Pain Loc --      Pain Edu? --      Excl. in [place]? --    No data found.  Updated Vital  Signs BP (S) (!) 158/84 (BP Location: Right Arm) Comment: Has not taken BP med yet  Pulse 80   Temp 97.8 F (36.6 C) (Oral)   Resp 16   Wt 248 lb (112.5 kg)   SpO2 100%   BMI 33.63 kg/m   Physical Exam Vitals and nursing note reviewed.  Constitutional:      General: He is not in acute distress.    Appearance: Normal appearance. He is not ill-appearing, toxic-appearing or diaphoretic.  Skin:    Capillary Refill: Capillary refill takes less than 2 seconds.     Comments: Well healed surgical amputation of toes of the left foot. Of the distal dorsal right foot there are two sets of small puncture wounds about 3 mm apart in a linear pattern. This appears new. NO discharge, induration or erythema.   Neurological:     Mental Status: He is alert.      UC Treatments / Results  Labs (all labs ordered are listed, but only abnormal results are displayed) Labs Reviewed - No data to display  EKG   Radiology No results found.  Procedures Procedures (including critical care time)  Medications Ordered in UC Medications - No data to display  Initial Impression / Assessment and Plan / UC Course  I have reviewed the triage vital signs and the nursing notes.  Pertinent labs & imaging results that were available during my care of the patient were reviewed by me and considered in my medical decision making (see chart for details).     New.  Upon further questioning patient states that he does have exposed carpet tacks around this area.  Strongly suspect that patient accidentally stepped on the carpet tacks twice causing the bleeding.  We will  obtain an x-ray to ensure no sign of foreign body and will start him on an antibiotic and close wound monitoring to prevent secondary systemic infection and complication.  Discussed red flag signs and symptoms.  Tdap here today.  UPDATE: NO evidence of foreign body. Wound cleaned and wrapped here today.  Final Clinical Impressions(s) / UC Diagnoses    Final diagnoses:  None   Discharge Instructions   None    ED Prescriptions    None     PDMP not reviewed this encounter.   Hughie Closs, PA-C 12/15/20 1114    [address], PA-C 12/15/20 1122

## 2020-12-15 NOTE — ED Triage Notes (Signed)
Patient presents to Urgent Care with complaints of left foot laceration. He states he woke up this morning and noticed bleeding to left foot. He is unsure of how injury occurred.  He secured laceration with a bandage.   Denies fever.

## 2020-12-17 LAB — ANTI-SMOOTH MUSCLE ANTIBODY, IGG: Actin (Smooth Muscle) Antibody (IGG): 22 U — ABNORMAL HIGH (ref <20)

## 2020-12-17 LAB — IGG: IgG (Immunoglobin G), Serum: 1606 mg/dL (ref 600–1640)

## 2020-12-17 LAB — CERULOPLASMIN: Ceruloplasmin: 35 mg/dL (ref 18–36)

## 2020-12-17 LAB — ALPHA-1-ANTITRYPSIN: A-1 Antitrypsin, Ser: 174 mg/dL (ref 83–199)

## 2020-12-25 ENCOUNTER — Other Ambulatory Visit: Payer: Self-pay

## 2020-12-25 DIAGNOSIS — R748 Abnormal levels of other serum enzymes: Secondary | ICD-10-CM

## 2020-12-25 DIAGNOSIS — R932 Abnormal findings on diagnostic imaging of liver and biliary tract: Secondary | ICD-10-CM

## 2021-01-04 ENCOUNTER — Other Ambulatory Visit: Payer: Self-pay | Admitting: Radiology

## 2021-01-05 ENCOUNTER — Ambulatory Visit (HOSPITAL_COMMUNITY): Admission: RE | Admit: 2021-01-05 | Payer: Medicare Other | Source: Ambulatory Visit

## 2021-01-05 NOTE — H&P (Incomplete)
Chief Complaint: Patient was seen in consultation today for image guided random hepatic biopsy at the request of [place]  Referring Physician(s): Armbruster,Steven P  Supervising Physician: Daryll Brod  Patient Status: [facility] - Out-pt  History of Present Illness: Edward [name] is a 50 y.o. male   Past Medical History:  Diagnosis Date  . Anemia    getting Venofer with dialysis  . Anxiety    self reported, sometimes-situational anxiety  . Arthritis    bilateral hands/LEFT hip  . CHF (congestive heart failure) ([place])   . Chronic kidney disease    t th s dialysis- [place] ---ESRD  . Complication of anesthesia    slow to go to sleep  . Coronary artery disease   . Depression    self reported, sometimes-situational depression  . Diabetes mellitus    type II- on meds  . Dyspnea    sometimes- fluid  . GERD (gastroesophageal reflux disease)    on meds  . Hypertension    no longer on medication since starting on Hemodialysis  . Myocardial infarction [facility]) 2019    Past Surgical History:  Procedure Laterality Date  . A/V FISTULAGRAM Left 03/06/2020   Procedure: A/V FISTULAGRAM;  Surgeon: Waynetta Sandy, MD;  Location: [place] [facility];  Service: Cardiovascular;  Laterality: Left;  . AMPUTATION Left 05/10/2016   Procedure: Left Transmetatarsal Amputation;  Surgeon: [name], MD;  Location: [place];  Service: Orthopedics;  Laterality: Left;  . AMPUTATION Right 05/16/2018   Procedure: PARTIAL RAY AMPUTATION FIFTH TOE RIGHT FOOT;  Surgeon: [name], DPM;  Location: [place];  Service: Podiatry;  Laterality: Right;  . AMPUTATION Right 06/17/2018   Procedure: AMPUTATION 4th RAY Right Foot;  Surgeon: Trula Slade, DPM;  Location: [place];  Service: Podiatry;  Laterality: Right;  . AMPUTATION Right 06/19/2018   Procedure: RIGHT BELOW KNEE AMPUTATION;  Surgeon: [name], MD;  Location: [place];  Service: Orthopedics;  Laterality: Right;  .  APPLICATION OF WOUND VAC Right 06/19/2018   Procedure: APPLICATION OF WOUND VAC;  Surgeon: [name], MD;  Location: [place];  Service: Orthopedics;  Laterality: Right;  . AV FISTULA PLACEMENT Left 11/25/2019   Procedure: LEFT ARM Brachiocephalic  ARTERIOVENOUS (AV) FISTULA CREATION;  Surgeon: Rosetta Posner, MD;  Location: [place];  Service: Vascular;  Laterality: Left;  . BASCILIC VEIN TRANSPOSITION Left 07/19/2020   Procedure: LEFT SECOND STAGE BASCILIC VEIN TRANSPOSITION;  Surgeon: Waynetta Sandy, MD;  Location: [place];  Service: Vascular;  Laterality: Left;  . CARDIAC CATHETERIZATION  07/29/2018  . CIRCUMCISION  09/02/2011   Procedure: CIRCUMCISION ADULT;  Surgeon: Molli Hazard, MD;  Location: [place];  Service: Urology;  Laterality: N/A;  . CORONARY STENT INTERVENTION N/A 07/29/2018   Procedure: CORONARY STENT INTERVENTION;  Surgeon: Leonie Man, MD;  Location: [place] [facility];  Service: Cardiovascular;  Laterality: N/A;  . I & D EXTREMITY  09/02/2011   Procedure: IRRIGATION AND DEBRIDEMENT EXTREMITY;  Surgeon: Theodoro Kos, DO;  Location: [place];  Service: Plastics;  Laterality: N/A;  . INCISION AND DRAINAGE OF WOUND  08/12/2011   Procedure: IRRIGATION AND DEBRIDEMENT WOUND;  Surgeon: Zenovia Jarred, MD;  Location: [place];  Service: General;;  perineum  . INSERTION OF DIALYSIS CATHETER N/A 11/25/2019   Procedure: INSERTION OF Righ Internal Jugular DIALYSIS CATHETER;  Surgeon: Rosetta Posner, MD;  Location: [place];  Service: Vascular;  Laterality: N/A;  . IRRIGATION AND DEBRIDEMENT ABSCESS  08/12/2011   Procedure: IRRIGATION AND DEBRIDEMENT ABSCESS;  Surgeon: Molli Hazard, MD;  Location: [place];  Service: Urology;  Laterality: N/A;  irrigation and debridment perineum    . IRRIGATION AND DEBRIDEMENT ABSCESS  08/14/2011   Procedure: MINOR INCISION AND DRAINAGE OF ABSCESS;  Surgeon: Molli Hazard, MD;  Location: [place];   Service: Urology;  Laterality: N/A;  Perineal Wound Debridement;Placement Bilateral Testicular Thigh Pouches  . LEFT HEART CATH AND CORONARY ANGIOGRAPHY N/A 07/29/2018   Procedure: LEFT HEART CATH AND CORONARY ANGIOGRAPHY;  Surgeon: Leonie Man, MD;  Location: [place] [facility];  Service: Cardiovascular;  Laterality: N/A;  . REVISON OF ARTERIOVENOUS FISTULA Left 05/24/2020   Procedure: LEFT ARM ARTERIOVENOUS FISTULA  CONVERSION TO BASILIC VEIN FISTULA;  Surgeon: Waynetta Sandy, MD;  Location: [place];  Service: Vascular;  Laterality: Left;  . TOTAL HIP ARTHROPLASTY Left 10/11/2016   Procedure: LEFT TOTAL HIP ARTHROPLASTY ANTERIOR APPROACH;  Surgeon: Mcarthur Rossetti, MD;  [facility];  Service: Orthopedics;  Laterality: Left;    Allergies: Crestor [rosuvastatin]  Medications: Prior to Admission medications   Medication Sig Start Date End Date Taking? Authorizing Provider  aspirin EC 81 MG tablet Take 1 tablet (81 mg total) by mouth daily. Swallow whole. 03/14/20   Lelon Perla, MD  bismuth subsalicylate (PEPTO BISMOL) 262 MG/15ML suspension Take 30 mLs by mouth every 6 (six) hours as needed for indigestion.    [provider]  blood glucose meter kit and supplies Dispense based on patient and insurance preference. Use up to four times daily as directed. (FOR ICD-9 250.00, 250.01). 07/01/18   Angiulli, Lavon Paganini, PA-C  cholecalciferol (VITAMIN D3) 25 MCG (1000 UNIT) tablet Take 1,000 Units by mouth Every Tuesday,Thursday,and Saturday with dialysis.     [provider]  doxercalciferol (HECTOROL) 4 MCG/2ML injection Doxercalciferol (Hectorol) 07/22/20 07/21/21  [provider]  doxycycline (VIBRAMYCIN) 100 MG capsule Take 1 capsule (100 mg total) by mouth 2 (two) times daily. 12/15/20   Hughie Closs, PA-C  insulin aspart protamine - aspart (NOVOLOG MIX 70/30 FLEXPEN) (70-30) 100 UNIT/ML FlexPen Inject 0.3 mLs (30 Units total) into the skin 2  (two) times daily with a meal. 09/29/20   Lavina Hamman, MD  Insulin Pen Needle 31G X 5 MM MISC Use daily to inject insulin as instructed 07/01/18   Angiulli, Lavon Paganini, PA-C  iron sucrose (VENOFER) 20 MG/ML injection Iron Sucrose (Venofer) 09/14/20 10/05/20  [provider]  linaclotide Rolan Lipa) 145 MCG CAPS capsule Take 1 capsule (145 mcg total) by mouth daily before breakfast. 12/13/20   Armbruster, Carlota Raspberry, MD  linaclotide (LINZESS) 72 MCG capsule Take 1 capsule (72 mcg total) by mouth daily before breakfast. 12/13/20   Armbruster, Carlota Raspberry, MD  Methoxy PEG-Epoetin Beta (MIRCERA IJ) Mircera 08/31/20 08/30/21  [provider]  pantoprazole (PROTONIX) 40 MG tablet Take 40 mg by mouth daily. 08/16/20   [provider]  polyethylene glycol (MIRALAX) 17 g packet Take 17 g by mouth daily. 12/13/20   Armbruster, Carlota Raspberry, MD  VELPHORO 500 MG chewable tablet Chew 500 mg by mouth 3 (three) times daily with meals.  06/14/20   [provider]     Family History  Problem Relation Age of Onset  . Diabetes Other   . Pancreatic cancer Mother 60  . Colon cancer Neg Hx   . Colon polyps Neg Hx   .  Esophageal cancer Neg Hx   . Rectal cancer Neg Hx   . Stomach cancer Neg Hx     Social History   Socioeconomic History  . Marital status: Widowed    Spouse name: Not on file  . Number of children: Not on file  . Years of education: Not on file  . Highest education level: Not on file  Occupational History  . Occupation: Disabled  Tobacco Use  . Smoking status: Former Smoker    Packs/day: 0.25    Years: 24.00    Pack years: 6.00    Types: Cigarettes    Quit date: 11/19/2019    Years since quitting: 1.1  . Smokeless tobacco: Never Used  Vaping Use  . Vaping Use: Never used  Substance and Sexual Activity  . Alcohol use: Not Currently    Alcohol/week: 0.0 standard drinks    Comment: quit 2018  . Drug use: No  . Sexual activity: Not Currently    Birth  control/protection: Condom  Other Topics Concern  . Not on file  Social History Narrative   Lives w/ his 2 sons.   Social Determinants of Health   Financial Resource Strain: Not on file  Food Insecurity: Not on file  Transportation Needs: Not on file  Physical Activity: Not on file  Stress: Not on file  Social Connections: Not on file     Review of Systems: A 12 point ROS discussed and pertinent positives are indicated in the HPI above.  All other systems are negative.   Vital Signs: There were no vitals taken for this visit.  Physical Exam     Imaging: DG Foot Complete Left  Result Date: 12/15/2020 CLINICAL DATA:  Left foot laceration EXAM: LEFT FOOT - COMPLETE 3+ VIEW COMPARISON:  05/07/2016 FINDINGS: Status post transmetatarsal amputation of the first through fifth rays. Well corticated resection margins without erosion or cortical destruction. No fracture or dislocation. Soft tissue swelling at the distal stump, nonspecific. No soft tissue gas. IMPRESSION: 1. No acute osseous abnormality of the left foot. 2. Soft tissue swelling at the distal stump, nonspecific. No soft tissue gas or radiographic evidence of osteomyelitis. Electronically Signed   By: Davina Poke D.O.   On: 12/15/2020 11:14    Labs:  CBC: Recent Labs    09/27/20 0205 09/27/20 0452 09/28/20 0340 09/29/20 0305  WBC 8.0 7.4 6.4 6.5  HGB 9.1* 9.8* 9.6* 9.4*  HCT 30.2* 30.5* 30.7* 30.1*  PLT 162 183 185 171    COAGS: Recent Labs    09/26/20 1900 09/27/20 1345 09/28/20 0340  INR 1.6* 1.3* 1.2  APTT 33  --   --     BMP: Recent Labs    09/26/20 1900 09/27/20 0205 09/27/20 0452 09/28/20 0340 09/29/20 0305  NA 135  --  136 134* 136  K 4.6  --  4.6 4.8 4.3  CL 93*  --  96* 97* 99  CO2 28  --  _0 GLUCOSE 306*  --  265* 375* 260*  BUN 45*  --  57* 69* 49*  CALCIUM 8.4*  --  8.4* 8.9 8.2*  CREATININE 7.00* 7.86* 8.06* 9.12* 7.46*  GFRNONAA 9* 8* 7* 6* 8*    LIVER FUNCTION  TESTS: Recent Labs    09/27/20 0452 09/28/20 0340 09/29/20 0305 12/13/20 0941  BILITOT 1.7* 1.0 0.9 0.8  AST 549* 278* 147* 89*  ALT 1,083* 908* 683* 173*  ALKPHOS 123 126 107 60  PROT 6.7 6.6  6.5 7.7  ALBUMIN 3.2* 3.1* 2.9* 4.1    TUMOR MARKERS: No results for input(s): AFPTM, CEA, CA199, CHROMGRNA in the last 8760 hours.  Assessment and Plan: 50 y.o. male   Thank you for this interesting consult.  I greatly enjoyed meeting CHUCK CABAN and look forward to participating in their care.  A copy of this report was sent to the requesting provider on this date.  Electronically Signed: Tera Mater, PA-C 01/05/2021, 12:08 PM   I spent a total of {New GJFT:[id number]} {New Out-Pt:[id number]}  {Established Out-Pt:[id number]} in face to face in clinical consultation, greater than 50% of which was counseling/coordinating care for ***

## 2021-01-08 ENCOUNTER — Telehealth (HOSPITAL_COMMUNITY): Payer: Self-pay

## 2021-02-01 ENCOUNTER — Encounter (HOSPITAL_COMMUNITY): Payer: Self-pay

## 2021-02-01 ENCOUNTER — Ambulatory Visit (HOSPITAL_COMMUNITY)
Admission: EM | Admit: 2021-02-01 | Discharge: 2021-02-01 | Disposition: A | Discharge status: Home / Self Care | Payer: Medicare Other | Attending: Urgent Care | Admitting: Urgent Care

## 2021-02-01 ENCOUNTER — Other Ambulatory Visit: Payer: Self-pay

## 2021-02-01 DIAGNOSIS — Z992 Dependence on renal dialysis: Secondary | ICD-10-CM

## 2021-02-01 DIAGNOSIS — B86 Scabies: Secondary | ICD-10-CM | POA: Diagnosis not present

## 2021-02-01 DIAGNOSIS — N186 End stage renal disease: Secondary | ICD-10-CM

## 2021-02-01 MED ORDER — PERMETHRIN 5 % EX CREA: TOPICAL_CREAM | CUTANEOUS | 0 refills | Status: DC

## 2021-02-01 NOTE — ED Triage Notes (Signed)
Pt in with c/o rash that he noticed 1 week ago, sates the bumps look pus filled but no drainage when he tries to pop them   Pt states the bumps on his back itch but are not painful  Pt also c/o blisters on his leg

## 2021-02-01 NOTE — ED Provider Notes (Signed)
[place]   MRN: [id number] DOB: 22-Jun-1971  Subjective:   Edward [name] is a 50 y.o. male presenting for 1 week history of persistent itchy spots over his back.  He had a couple of spots on his chest that were not as bothersome but are still there.  Denies fever, tenderness, blisters, drainage of pus or bleeding.   Current Facility-Administered Medications:    0.9 %  sodium chloride infusion, 250 mL, Intravenous, PRN, Waynetta Sandy, MD  Current Outpatient Medications:    aspirin EC 81 MG tablet, Take 1 tablet (81 mg total) by mouth daily. Swallow whole., Disp: 90 tablet, Rfl: 3   bismuth subsalicylate (PEPTO BISMOL) 262 MG/15ML suspension, Take 30 mLs by mouth every 6 (six) hours as needed for indigestion., Disp: , Rfl:    blood glucose meter kit and supplies, Dispense based on patient and insurance preference. Use up to four times daily as directed. (FOR ICD-9 250.00, 250.01)., Disp: 1 each, Rfl: 0   cholecalciferol (VITAMIN D3) 25 MCG (1000 UNIT) tablet, Take 1,000 Units by mouth Every Tuesday,Thursday,and Saturday with dialysis. , Disp: , Rfl:    doxercalciferol (HECTOROL) 4 MCG/2ML injection, Doxercalciferol (Hectorol), Disp: , Rfl:    doxycycline (VIBRAMYCIN) 100 MG capsule, Take 1 capsule (100 mg total) by mouth 2 (two) times daily., Disp: 20 capsule, Rfl: 0   insulin aspart protamine - aspart (NOVOLOG MIX 70/30 FLEXPEN) (70-30) 100 UNIT/ML FlexPen, Inject 0.3 mLs (30 Units total) into the skin 2 (two) times daily with a meal., Disp: 15 mL, Rfl: 0   Insulin Pen Needle 31G X 5 MM MISC, Use daily to inject insulin as instructed, Disp: 100 each, Rfl: 2   iron sucrose (VENOFER) 20 MG/ML injection, Iron Sucrose (Venofer), Disp: , Rfl:    linaclotide (LINZESS) 145 MCG CAPS capsule, Take 1 capsule (145 mcg total) by mouth daily before breakfast., Disp: 8 capsule, Rfl: 0   linaclotide (LINZESS) 72 MCG capsule, Take 1 capsule (72 mcg total) by mouth daily  before breakfast., Disp: 8 capsule, Rfl: 0   Methoxy PEG-Epoetin Beta (MIRCERA IJ), Mircera, Disp: , Rfl:    pantoprazole (PROTONIX) 40 MG tablet, Take 40 mg by mouth daily., Disp: , Rfl:    polyethylene glycol (MIRALAX) 17 g packet, Take 17 g by mouth daily., Disp: 14 each, Rfl: 0   VELPHORO 500 MG chewable tablet, Chew 500 mg by mouth 3 (three) times daily with meals. , Disp: , Rfl:    Allergies  Allergen Reactions   Crestor [Rosuvastatin] Rash    Also had a red eye    Past Medical History:  Diagnosis Date   Anemia    getting Venofer with dialysis   Anxiety    self reported, sometimes-situational anxiety   Arthritis    bilateral hands/LEFT hip   CHF (congestive heart failure) ([facility])    Chronic kidney disease    t th s dialysis- [place] ---ESRD   Complication of anesthesia    slow to go to sleep   Coronary artery disease    Depression    self reported, sometimes-situational depression   Diabetes mellitus    type II- on meds   Dyspnea    sometimes- fluid   GERD (gastroesophageal reflux disease)    on meds   Hypertension    no longer on medication since starting on Hemodialysis   Myocardial infarction ([place]) 2019     Past Surgical History:  Procedure Laterality Date   A/V FISTULAGRAM  Left 03/06/2020   Procedure: A/V FISTULAGRAM;  Surgeon: Waynetta Sandy, MD;  Location: [place] [facility];  Service: Cardiovascular;  Laterality: Left;   AMPUTATION Left 05/10/2016   Procedure: Left Transmetatarsal Amputation;  Surgeon: [name], MD;  Location: [place];  Service: Orthopedics;  Laterality: Left;   AMPUTATION Right 05/16/2018   Procedure: PARTIAL RAY AMPUTATION FIFTH TOE RIGHT FOOT;  Surgeon: [name], DPM;  Location: [place];  Service: Podiatry;  Laterality: Right;   AMPUTATION Right 06/17/2018   Procedure: AMPUTATION 4th RAY Right Foot;  Surgeon: Trula Slade, DPM;  Location: [place];  Service: Podiatry;  Laterality: Right;   AMPUTATION Right 06/19/2018    Procedure: RIGHT BELOW KNEE AMPUTATION;  Surgeon: [name], MD;  Location: [place];  Service: Orthopedics;  Laterality: Right;   APPLICATION OF WOUND VAC Right 06/19/2018   Procedure: APPLICATION OF WOUND VAC;  Surgeon: [name], MD;  Location: [place];  Service: Orthopedics;  Laterality: Right;   AV FISTULA PLACEMENT Left 11/25/2019   Procedure: LEFT ARM Brachiocephalic  ARTERIOVENOUS (AV) FISTULA CREATION;  Surgeon: Rosetta Posner, MD;  Location: [place];  Service: Vascular;  Laterality: Left;   [place] Left 07/19/2020   Procedure: LEFT SECOND STAGE [place];  Surgeon: Waynetta Sandy, MD;  Location: [place];  Service: Vascular;  Laterality: Left;   CARDIAC CATHETERIZATION  07/29/2018   CIRCUMCISION  09/02/2011   Procedure: CIRCUMCISION ADULT;  Surgeon: Molli Hazard, MD;  Location: [place];  Service: Urology;  Laterality: N/A;   CORONARY STENT INTERVENTION N/A 07/29/2018   Procedure: CORONARY STENT INTERVENTION;  Surgeon: Leonie Man, MD;  Location: [place] [facility];  Service: Cardiovascular;  Laterality: N/A;   I & D EXTREMITY  09/02/2011   Procedure: IRRIGATION AND DEBRIDEMENT EXTREMITY;  Surgeon: Theodoro Kos, DO;  Location: [place];  Service: Plastics;  Laterality: N/A;   INCISION AND DRAINAGE OF WOUND  08/12/2011   Procedure: IRRIGATION AND DEBRIDEMENT WOUND;  Surgeon: Zenovia Jarred, MD;  Location: [place];  Service: General;;  perineum   INSERTION OF DIALYSIS CATHETER N/A 11/25/2019   Procedure: INSERTION OF Righ Internal Jugular DIALYSIS CATHETER;  Surgeon: Rosetta Posner, MD;  Location: [place];  Service: Vascular;  Laterality: N/A;   [place]  08/12/2011   Procedure: IRRIGATION AND DEBRIDEMENT ABSCESS;  Surgeon: Molli Hazard, MD;  Location: [place];  Service: Urology;  Laterality: N/A;  irrigation and debridment perineum     IRRIGATION AND DEBRIDEMENT ABSCESS   08/14/2011   Procedure: MINOR INCISION AND DRAINAGE OF ABSCESS;  Surgeon: Molli Hazard, MD;  Location: [place];  Service: Urology;  Laterality: N/A;  Perineal Wound Debridement;Placement Bilateral Testicular Thigh Pouches   LEFT HEART CATH AND CORONARY ANGIOGRAPHY N/A 07/29/2018   Procedure: LEFT HEART CATH AND CORONARY ANGIOGRAPHY;  Surgeon: Leonie Man, MD;  Location: [place] [facility];  Service: Cardiovascular;  Laterality: N/A;   REVISON OF ARTERIOVENOUS FISTULA Left 05/24/2020   Procedure: LEFT ARM ARTERIOVENOUS FISTULA  CONVERSION TO BASILIC VEIN FISTULA;  Surgeon: Waynetta Sandy, MD;  Location: [place];  Service: Vascular;  Laterality: Left;   TOTAL HIP ARTHROPLASTY Left 10/11/2016   Procedure: LEFT TOTAL HIP ARTHROPLASTY ANTERIOR APPROACH;  Surgeon: Mcarthur Rossetti, MD;  [facility];  Service: Orthopedics;  Laterality: Left;    Family History  Problem Relation Age of Onset   Diabetes  Other    Pancreatic cancer Mother 47   Colon cancer Neg Hx    Colon polyps Neg Hx    Esophageal cancer Neg Hx    Rectal cancer Neg Hx    Stomach cancer Neg Hx     Social History   Tobacco Use   Smoking status: Former    Packs/day: 0.25    Years: 24.00    Pack years: 6.00    Types: Cigarettes    Quit date: 11/19/2019    Years since quitting: 1.2   Smokeless tobacco: Never  Vaping Use   Vaping Use: Never used  Substance Use Topics   Alcohol use: Not Currently    Alcohol/week: 0.0 standard drinks    Comment: quit 2018   Drug use: No    ROS   Objective:   Vitals: BP (!) 181/87 (BP Location: Right Arm)   Pulse 93   Temp 98.6 F (37 C) (Oral)   Resp 18   SpO2 97%   Physical Exam Constitutional:      General: He is not in acute distress.    Appearance: Normal appearance. He is well-developed and normal weight. He is not ill-appearing, toxic-appearing or diaphoretic.  HENT:     Head: Normocephalic and atraumatic.     Right Ear: External ear normal.      Left Ear: External ear normal.     Nose: Nose normal.     Mouth/Throat:     Pharynx: Oropharynx is clear.  Eyes:     General: No scleral icterus.       Right eye: No discharge.        Left eye: No discharge.     Extraocular Movements: Extraocular movements intact.     Pupils: Pupils are equal, round, and reactive to light.  Cardiovascular:     Rate and Rhythm: Normal rate.  Pulmonary:     Effort: Pulmonary effort is normal.  Musculoskeletal:     Cervical back: Normal range of motion.  Skin:      Neurological:     Mental Status: He is alert and oriented to person, place, and time.  Psychiatric:        Mood and Affect: Mood normal.        Behavior: Behavior normal.        Thought Content: Thought content normal.        Judgment: Judgment normal.     Assessment and Plan :   PDMP not reviewed this encounter.  1. Scabies   2. ESRD on dialysis [place] [facility])     This is an atypical location for scabies but is very consistent with this.  Will use permethrin cream as this is safe even though he has ESRD.  Recommended monitoring for resolving folliculitis anteriorly. Counseled patient on potential for adverse effects with medications prescribed/recommended today, ER and return-to-clinic precautions discussed, patient verbalized understanding.    Jaynee Eagles, PA-C 02/01/21 1315

## 2021-02-09 ENCOUNTER — Telehealth: Payer: Self-pay

## 2021-02-09 NOTE — Telephone Encounter (Signed)
Spoke with patient, he states that he received Permethrin cream once. No other prescribed medications given to patient. He states that he still has the spots but they are not itching, no drainage, or open wounds. Patient was sent home with Permthrin cream. Please advise, thanks.

## 2021-02-09 NOTE — Telephone Encounter (Signed)
-----   Message from Yetta Flock, MD sent at 02/09/2021  7:55 AM EDT ----- Regarding: RE: [facility] pt John good question. I am not aware that we have a policy on this.  Langley Gauss what do you make of this? I am assuming once he was diagnosed he should have been treated. We can call him to see how he is doing and what his treatment regimen entails. If completed treated and asymptomatic, okay to proceed?  Kimball Manske can you help and contact this patient to see if this issue has been addressed and how he is doing? Thanks much.   ----- Message ----- From: Osvaldo Angst, CRNA Sent: 02/09/2021   6:18 AM EDT To: Yetta Flock, MD Subject: [facility] pt                                         Dr. Havery Moros,  This pt is scheduled with you on June 29th.  On 6/16 he was dx'd with scabies, not sure if this malady is an issue for his presence in the [place]?  Thanks,  Osvaldo Angst

## 2021-02-09 NOTE — Telephone Encounter (Signed)
Thank you [place], Sounds like this has been addressed and if asymptomatic I think okay to proceed. Langley Gauss can you confirm? Thank you

## 2021-02-09 NOTE — Telephone Encounter (Signed)
Patient returned call. Best contact number [phone].

## 2021-02-09 NOTE — Telephone Encounter (Signed)
Lm on vm for patient to return call 

## 2021-02-12 NOTE — Telephone Encounter (Signed)
Lm on vm for patient to return call 

## 2021-02-13 NOTE — Telephone Encounter (Signed)
Spoke with patient, he is aware that since he has ben treated and asymptomatic we can proceed as scheduled tomorrow. Patient verbalized understanding and had no concerns at the end of the call.

## 2021-02-14 ENCOUNTER — Encounter: Payer: Self-pay | Admitting: Gastroenterology

## 2021-02-14 ENCOUNTER — Other Ambulatory Visit: Payer: Self-pay

## 2021-02-14 ENCOUNTER — Ambulatory Visit (AMBULATORY_SURGERY_CENTER): Payer: Medicare Other | Admitting: Gastroenterology

## 2021-02-14 VITALS — BP 134/72 | HR 75 | Temp 98.6°F | Resp 19 | Ht 72.0 in | Wt 249.0 lb

## 2021-02-14 DIAGNOSIS — D125 Benign neoplasm of sigmoid colon: Secondary | ICD-10-CM | POA: Diagnosis not present

## 2021-02-14 DIAGNOSIS — D122 Benign neoplasm of ascending colon: Secondary | ICD-10-CM | POA: Diagnosis not present

## 2021-02-14 DIAGNOSIS — D123 Benign neoplasm of transverse colon: Secondary | ICD-10-CM

## 2021-02-14 DIAGNOSIS — Z1211 Encounter for screening for malignant neoplasm of colon: Secondary | ICD-10-CM

## 2021-02-14 MED ORDER — SODIUM CHLORIDE 0.9 % IV SOLN: 500.0000 mL | Freq: Once | INTRAVENOUS | Status: DC

## 2021-02-14 NOTE — Patient Instructions (Signed)
 Handouts on polyps & diverticulosis given to you today  Await pathology results on polyps removed    YOU HAD AN ENDOSCOPIC PROCEDURE TODAY AT THE [facility]:   Refer to the procedure report that was given to you for any specific questions about what was found during the examination.  If the procedure report does not answer your questions, please call your gastroenterologist to clarify.  If you requested that your care partner not be given the details of your procedure findings, then the procedure report has been included in a sealed envelope for you to review at your convenience later.  YOU SHOULD EXPECT: Some feelings of bloating in the abdomen. Passage of more gas than usual.  Walking can help get rid of the air that was put into your GI tract during the procedure and reduce the bloating. If you had a lower endoscopy (such as a colonoscopy or flexible sigmoidoscopy) you may notice spotting of blood in your stool or on the toilet paper. If you underwent a bowel prep for your procedure, you may not have a normal bowel movement for a few days.  Please Note:  You might notice some irritation and congestion in your nose or some drainage.  This is from the oxygen used during your procedure.  There is no need for concern and it should clear up in a day or so.  SYMPTOMS TO REPORT IMMEDIATELY:   Following lower endoscopy (colonoscopy or flexible sigmoidoscopy):  Excessive amounts of blood in the stool  Significant tenderness or worsening of abdominal pains  Swelling of the abdomen that is new, acute  Fever of 100F or higher   For urgent or emergent issues, a gastroenterologist can be reached at any hour by calling [phone]. Do not use MyChart messaging for urgent concerns.    DIET:  We do recommend a small meal at first, but then you may proceed to your regular diet.  Drink plenty of fluids but you should avoid alcoholic beverages for 24 hours.  ACTIVITY:  You should plan  to take it easy for the rest of today and you should NOT DRIVE or use heavy machinery until tomorrow (because of the sedation medicines used during the test).    FOLLOW UP: Our staff will call the number listed on your records 48-72 hours following your procedure to check on you and address any questions or concerns that you may have regarding the information given to you following your procedure. If we do not reach you, we will leave a message.  We will attempt to reach you two times.  During this call, we will ask if you have developed any symptoms of COVID 19. If you develop any symptoms (ie: fever, flu-like symptoms, shortness of breath, cough etc.) before then, please call [phone].  If you test positive for Covid 19 in the 2 weeks post procedure, please call and report this information to us.    If any biopsies were taken you will be contacted by phone or by letter within the next 1-3 weeks.  Please call us at [phone] if you have not heard about the biopsies in 3 weeks.    SIGNATURES/CONFIDENTIALITY: You and/or your care partner have signed paperwork which will be entered into your electronic medical record.  These signatures attest to the fact that that the information above on your After Visit Summary has been reviewed and is understood.  Full responsibility of the confidentiality of this discharge information lies with you and/or   your care-partner.

## 2021-02-14 NOTE — Progress Notes (Signed)
Report to PACU, RN, vss, BBS= Clear.  

## 2021-02-14 NOTE — Progress Notes (Signed)
Check-in-aer  Vital signs-[place]  Pt. Has ulcer to left lower leg about size of fifty cent piece, no active drainage noted at this time. Right leg below knee amputation.

## 2021-02-14 NOTE — Progress Notes (Signed)
Called to room to assist during endoscopic procedure.  Patient ID and intended procedure confirmed with present staff. Received instructions for my participation in the procedure from the performing physician.  

## 2021-02-14 NOTE — Op Note (Signed)
[facility] Patient Name: Edward [name] Procedure Date: 02/14/2021 3:04 PM MRN: [id number] Endoscopist: Remo Lipps P. Havery Moros , MD Age: 50 Referring MD:  Date of Birth: 09-14-1970 Gender: Male Account #: [id number] Procedure:                Colonoscopy Indications:              Screening for colorectal malignant neoplasm, This                            is the patient's first colonoscopy Medicines:                Monitored Anesthesia Care Procedure:                Pre-Anesthesia Assessment:                           - Prior to the procedure, a History and Physical                            was performed, and patient medications and                            allergies were reviewed. The patient's tolerance of                            previous anesthesia was also reviewed. The risks                            and benefits of the procedure and the sedation                            options and risks were discussed with the patient.                            All questions were answered, and informed consent                            was obtained. Prior Anticoagulants: The patient has                            taken no previous anticoagulant or antiplatelet                            agents. ASA Grade Assessment: III - A patient with                            severe systemic disease. After reviewing the risks                            and benefits, the patient was deemed in                            satisfactory condition to undergo the procedure.  After obtaining informed consent, the colonoscope                            was passed under direct vision. Throughout the                            procedure, the patient's blood pressure, pulse, and                            oxygen saturations were monitored continuously. The                            Colonoscope was introduced through the anus and                            advanced to the the  cecum, identified by                            appendiceal orifice and ileocecal valve. The                            colonoscopy was performed without difficulty. The                            patient tolerated the procedure well. The quality                            of the bowel preparation was good. The ileocecal                            valve, appendiceal orifice, and rectum were                            photographed. Scope In: 3:09:02 PM Scope Out: 3:31:56 PM Scope Withdrawal Time: 0 hours 16 minutes 1 second  Total Procedure Duration: 0 hours 22 minutes 54 seconds  Findings:                 The perianal and digital rectal examinations were                            normal.                           A 6 to 7 mm polyp was found in the ascending colon.                            The polyp was sessile. The polyp was removed with a                            cold snare. Resection and retrieval were complete.                           A 3 to 4 mm polyp was found in the transverse  colon. The polyp was sessile. The polyp was removed                            with a cold snare. Resection and retrieval were                            complete.                           A 3 mm polyp was found in the sigmoid colon. The                            polyp was sessile. The polyp was removed with a                            cold snare. Resection and retrieval were complete.                           A few small-mouthed diverticula were found in the                            entire colon.                           The colon did not retain air well which led to a                            very spastic colon, which prolonged this exam.                            Retroflexed views were limited given poor air                            retention in the rectum. The exam was otherwise                            without abnormality. Complications:            No  immediate complications. Estimated blood loss:                            Minimal. Estimated Blood Loss:     Estimated blood loss was minimal. Impression:               - One 6 to 7 mm polyp in the ascending colon,                            removed with a cold snare. Resected and retrieved.                           - One 3 to 4 mm polyp in the transverse colon,                            removed with a cold snare.  Resected and retrieved.                           - One 3 mm polyp in the sigmoid colon, removed with                            a cold snare. Resected and retrieved.                           - Diverticulosis in the entire examined colon.                           - The examination was otherwise normal. Recommendation:           - Patient has a contact number available for                            emergencies. The signs and symptoms of potential                            delayed complications were discussed with the                            patient. Return to normal activities tomorrow.                            Written discharge instructions were provided to the                            patient.                           - Resume previous diet.                           - Continue present medications.                           - Await pathology results. Remo Lipps P. Havery Moros, MD 02/14/2021 3:38:31 PM This report has been signed electronically.

## 2021-02-14 NOTE — Progress Notes (Signed)
Pt has severe OSA  after initial sedation, had to hold jaw thrust multiple times to reverse desats.  Very minimal sedation during,  dr A aware

## 2021-02-16 ENCOUNTER — Telehealth: Payer: Self-pay

## 2021-02-16 ENCOUNTER — Telehealth: Payer: Self-pay | Admitting: *Deleted

## 2021-02-16 NOTE — Telephone Encounter (Signed)
  Follow up Call-  Call back number 02/14/2021  Post procedure Call Back phone  # [phone]  Permission to leave phone message Yes  Some recent data might be hidden     Patient questions:  Do you have a fever, pain , or abdominal swelling? No. Pain Score  0 *  Have you tolerated food without any problems? Yes.    Have you been able to return to your normal activities? Yes.    Do you have any questions about your discharge instructions: Diet   No. Medications  No. Follow up visit  No.  Do you have questions or concerns about your Care? No.  Actions: * If pain score is 4 or above: No action needed, pain <4.   Have you developed a fever since your procedure? no  2.   Have you had an respiratory symptoms (SOB or cough) since your procedure? no  3.   Have you tested positive for COVID 19 since your procedure no  4.   Have you had any family members/close contacts diagnosed with the COVID 19 since your procedure?  no   If yes to any of these questions please route to Joylene John, RN and Joella Prince, RN

## 2021-03-09 ENCOUNTER — Other Ambulatory Visit: Payer: Self-pay | Admitting: *Deleted

## 2021-03-09 DIAGNOSIS — I739 Peripheral vascular disease, unspecified: Secondary | ICD-10-CM

## 2021-03-16 ENCOUNTER — Ambulatory Visit: Payer: Medicare Other | Admitting: Vascular Surgery

## 2021-03-16 ENCOUNTER — Ambulatory Visit (HOSPITAL_COMMUNITY): Payer: Medicare Other | Attending: Vascular Surgery

## 2021-03-23 ENCOUNTER — Ambulatory Visit (HOSPITAL_COMMUNITY)
Admission: RE | Admit: 2021-03-23 | Discharge: 2021-03-23 | Disposition: A | Discharge status: Home / Self Care | Payer: Medicare Other | Source: Ambulatory Visit | Attending: Vascular Surgery | Admitting: Vascular Surgery

## 2021-03-23 ENCOUNTER — Other Ambulatory Visit: Payer: Self-pay

## 2021-03-23 ENCOUNTER — Ambulatory Visit (INDEPENDENT_AMBULATORY_CARE_PROVIDER_SITE_OTHER): Payer: Medicare Other | Admitting: Vascular Surgery

## 2021-03-23 ENCOUNTER — Encounter: Payer: Self-pay | Admitting: Vascular Surgery

## 2021-03-23 VITALS — BP 137/81 | HR 85 | Temp 98.5°F | Resp 20 | Ht 72.0 in | Wt 249.0 lb

## 2021-03-23 DIAGNOSIS — I739 Peripheral vascular disease, unspecified: Secondary | ICD-10-CM

## 2021-03-23 NOTE — Progress Notes (Signed)
Patient ID: Edward [name], male   DOB: 1970/12/29, 50 y.o.   MRN: [id number]  Reason for Consult: Follow-up   Referred by [name], MD  Subjective:     HPI:  Edward [name] is a 50 y.o. male well-known to me from previous upper extremity dialysis access.  He has a previous right below-knee amputation walks with help of a prosthetic.  On the left side he has transmetatarsal amputation which is well-healed.  He has wounds on his left leg which have failed to heal and are quite weepy.  He does walk with help of a cane.  He denies any issues with his transmetatarsal amputation.  He does not have fevers or chills.  He is unsure of any previous lower extremity vascular inventions.  He does take aspirin daily no statin.  Past Medical History:  Diagnosis Date   Allergy    Anemia    getting Venofer with dialysis   Anxiety    self reported, sometimes-situational anxiety   Arthritis    bilateral hands/LEFT hip   CHF (congestive heart failure) (HCC)    Chronic kidney disease    t th s dialysis- [place] ---ESRD   Complication of anesthesia    slow to go to sleep   Coronary artery disease    Depression    self reported, sometimes-situational depression   Diabetes mellitus    type II- on meds   Dyspnea    sometimes- fluid   GERD (gastroesophageal reflux disease)    on meds   Hypertension    no longer on medication since starting on Hemodialysis   Myocardial infarction ([place]) 2019   Sleep apnea    Family History  Problem Relation Age of Onset   Diabetes Other    Pancreatic cancer Mother 24   Colon cancer Neg Hx    Colon polyps Neg Hx    Esophageal cancer Neg Hx    Rectal cancer Neg Hx    Stomach cancer Neg Hx    Past Surgical History:  Procedure Laterality Date   A/V FISTULAGRAM Left 03/06/2020   Procedure: A/V FISTULAGRAM;  Surgeon: Edward Sandy, MD;  Location: [facility];  Service: Cardiovascular;  Laterality: Left;   AMPUTATION Left 05/10/2016    Procedure: Left Transmetatarsal Amputation;  Surgeon: [name], MD;  Location: [facility];  Service: Orthopedics;  Laterality: Left;   AMPUTATION Right 05/16/2018   Procedure: PARTIAL RAY AMPUTATION FIFTH TOE RIGHT FOOT;  Surgeon: [name], DPM;  Location: [facility];  Service: Podiatry;  Laterality: Right;   AMPUTATION Right 06/17/2018   Procedure: AMPUTATION 4th RAY Right Foot;  Surgeon: Trula Slade, DPM;  Location: [facility];  Service: Podiatry;  Laterality: Right;   AMPUTATION Right 06/19/2018   Procedure: RIGHT BELOW KNEE AMPUTATION;  Surgeon: [name], MD;  Location: [facility];  Service: Orthopedics;  Laterality: Right;   APPLICATION OF WOUND VAC Right 06/19/2018   Procedure: APPLICATION OF WOUND VAC;  Surgeon: [name], MD;  Location: [place];  Service: Orthopedics;  Laterality: Right;   AV FISTULA PLACEMENT Left 11/25/2019   Procedure: LEFT ARM Brachiocephalic  ARTERIOVENOUS (AV) FISTULA CREATION;  Surgeon: Rosetta Posner, MD;  Location: [facility];  Service: Vascular;  Laterality: Left;   [place] Left 07/19/2020   Procedure: LEFT SECOND STAGE [place];  Surgeon: Edward Sandy, MD;  Location: [facility];  Service: Vascular;  Laterality: Left;   CARDIAC CATHETERIZATION  07/29/2018   CIRCUMCISION  09/02/2011   Procedure: CIRCUMCISION ADULT;  Surgeon: Molli Hazard, MD;  Location: [facility];  Service: Urology;  Laterality: N/A;   CORONARY STENT INTERVENTION N/A 07/29/2018   Procedure: CORONARY STENT INTERVENTION;  Surgeon: Leonie Man, MD;  Location: [facility];  Service: Cardiovascular;  Laterality: N/A;   I & D EXTREMITY  09/02/2011   Procedure: IRRIGATION AND DEBRIDEMENT EXTREMITY;  Surgeon: Theodoro Kos, DO;  Location: [facility];  Service: Plastics;  Laterality: N/A;   INCISION AND DRAINAGE OF WOUND  08/12/2011   Procedure: IRRIGATION AND DEBRIDEMENT WOUND;  Surgeon: Zenovia Jarred, MD;   Location: [facility];  Service: General;;  perineum   INSERTION OF DIALYSIS CATHETER N/A 11/25/2019   Procedure: INSERTION OF Righ Internal Jugular DIALYSIS CATHETER;  Surgeon: Rosetta Posner, MD;  Location: [place];  Service: Vascular;  Laterality: N/A;   [place]  08/12/2011   Procedure: IRRIGATION AND DEBRIDEMENT ABSCESS;  Surgeon: Molli Hazard, MD;  Location: [facility];  Service: Urology;  Laterality: N/A;  irrigation and debridment perineum     IRRIGATION AND DEBRIDEMENT ABSCESS  08/14/2011   Procedure: MINOR INCISION AND DRAINAGE OF ABSCESS;  Surgeon: Molli Hazard, MD;  Location: [place];  Service: Urology;  Laterality: N/A;  Perineal Wound Debridement;Placement Bilateral Testicular Thigh Pouches   LEFT HEART CATH AND CORONARY ANGIOGRAPHY N/A 07/29/2018   Procedure: LEFT HEART CATH AND CORONARY ANGIOGRAPHY;  Surgeon: Leonie Man, MD;  Location: [facility];  Service: Cardiovascular;  Laterality: N/A;   REVISON OF ARTERIOVENOUS FISTULA Left 05/24/2020   Procedure: LEFT ARM ARTERIOVENOUS FISTULA  CONVERSION TO BASILIC VEIN FISTULA;  Surgeon: Edward Sandy, MD;  Location: [place];  Service: Vascular;  Laterality: Left;   TOTAL HIP ARTHROPLASTY Left 10/11/2016   Procedure: LEFT TOTAL HIP ARTHROPLASTY ANTERIOR APPROACH;  Surgeon: Mcarthur Rossetti, MD;  Location: [facility];  Service: Orthopedics;  Laterality: Left;    Short Social History:  Social History   Tobacco Use   Smoking status: Former    Packs/day: 0.25    Years: 24.00    Pack years: 6.00    Types: Cigarettes    Quit date: 11/19/2019    Years since quitting: 1.3   Smokeless tobacco: Never  Substance Use Topics   Alcohol use: Not Currently    Alcohol/week: 0.0 standard drinks    Comment: quit 2018    Allergies  Allergen Reactions   Crestor [Rosuvastatin] Rash    Also had a red eye    Current Outpatient Medications  Medication Sig Dispense Refill   aspirin EC 81 MG  tablet Take 1 tablet (81 mg total) by mouth daily. Swallow whole. 90 tablet 3   bismuth subsalicylate (PEPTO BISMOL) 262 MG/15ML suspension Take 30 mLs by mouth every 6 (six) hours as needed for indigestion.     blood glucose meter kit and supplies Dispense based on patient and insurance preference. Use up to four times daily as directed. (FOR ICD-9 250.00, 250.01). 1 each 0   cholecalciferol (VITAMIN D3) 25 MCG (1000 UNIT) tablet Take 1,000 Units by mouth Every Tuesday,Thursday,and Saturday with dialysis.      doxercalciferol (HECTOROL) 4 MCG/2ML injection Doxercalciferol (Hectorol)     insulin aspart protamine - aspart (NOVOLOG MIX 70/30 FLEXPEN) (70-30) 100 UNIT/ML FlexPen Inject 0.3 mLs (30 Units total) into the skin 2 (two) times daily with a meal. 15 mL 0  Insulin Pen Needle 31G X 5 MM MISC Use daily to inject insulin as instructed 100 each 2   linaclotide (LINZESS) 145 MCG CAPS capsule Take 1 capsule (145 mcg total) by mouth daily before breakfast. 8 capsule 0   linaclotide (LINZESS) 72 MCG capsule Take 1 capsule (72 mcg total) by mouth daily before breakfast. 8 capsule 0   Methoxy PEG-Epoetin Beta (MIRCERA IJ) Mircera     pantoprazole (PROTONIX) 40 MG tablet Take 40 mg by mouth daily.     permethrin (ELIMITE) 5 % cream Thoroughly massage cream from head to soles of feet. Leave on for 8 to 14 hours before washing off. 60 g 0   polyethylene glycol (MIRALAX) 17 g packet Take 17 g by mouth daily. 14 each 0   VELPHORO 500 MG chewable tablet Chew 500 mg by mouth 3 (three) times daily with meals.      iron sucrose (VENOFER) 20 MG/ML injection Iron Sucrose (Venofer)     Current Facility-Administered Medications  Medication Dose Route Frequency Provider Last Rate Last Admin   0.9 %  sodium chloride infusion  500 mL Intravenous Once Armbruster, Carlota Raspberry, MD        Review of Systems  Constitutional:  Constitutional negative. HENT: HENT negative.  Eyes: Eyes negative.  Cardiovascular: Positive  for leg swelling.  GI: Gastrointestinal negative.  Skin: Positive for wound.  Neurological: Neurological negative. Psychiatric: Psychiatric negative.       Objective:  Objective   Vitals:   03/23/21 1407  BP: 137/81  Pulse: 85  Resp: 20  Temp: 98.5 F (36.9 C)  SpO2: 94%  Weight: 249 lb (112.9 kg)  Height: 6' (1.829 m)   Body mass index is 33.77 kg/m.  Physical Exam HENT:     Head: Normocephalic.     Nose:     Comments: Wearing a mask Eyes:     Pupils: Pupils are equal, round, and reactive to light.  Cardiovascular:     Pulses:          Femoral pulses are 2+ on the right side and 2+ on the left side.      Popliteal pulses are 0 on the left side.  Pulmonary:     Effort: Pulmonary effort is normal.  Abdominal:     General: Abdomen is flat.     Palpations: Abdomen is soft.  Musculoskeletal:     Left lower leg: Edema present.     Comments: Well-healed left transmetatarsal amputation  Skin:    Comments: There are 3 1 to 2 cm wounds on his anterior left leg that are weeping  Neurological:     General: No focal deficit present.     Mental Status: He is alert.  Psychiatric:        Mood and Affect: Mood normal.        Behavior: Behavior normal.        Thought Content: Thought content normal.        Judgment: Judgment normal.    Data: ABI Findings:  +--------+------------------+-----+--------+--------+  Right   Rt Pressure (mmHg)IndexWaveformComment   +--------+------------------+-----+--------+--------+  Brachial140                                      +--------+------------------+-----+--------+--------+   +--------+------------------+-----+--------+---------+  Left    Lt Pressure (mmHg)IndexWaveformComment    +--------+------------------+-----+--------+---------+  Brachial  HD access  +--------+------------------+-----+--------+---------+  PTA     97                0.69 biphasic            +--------+------------------+-----+--------+---------+  DP      98                0.70 biphasic           +--------+------------------+-----+--------+---------+   +-------+-----------+-----------+------------+------------+  ABI/TBIToday's ABIToday's TBIPrevious ABIPrevious TBI  +-------+-----------+-----------+------------+------------+  Right  0.7        amp        0.93        0.64          +-------+-----------+-----------+------------+------------+  Left   amp        amp        0.96        amp           +-------+-----------+-----------+------------+------------+     Previous ABI at [facility] on 01/17/17.     Summary:  Right: BKA.  Left: Resting left ankle-brachial index indicates moderate left lower  extremity arterial disease. Transmetatarsal amputation.      Assessment/Plan:     50 year old male here with wounds of his left lower extremity that appear to be mixed venous and arterial in nature.  I discussed that he will likely need compression and venous evaluation but will start with angiography to evaluate the left lower extremity from a right common femoral approach.  His testicles are embedded in his bilateral thighs from previous Fournier's gangrene surgery and we will need to avoid these at the time of femoral approach.  I discussed the possible options being endovascular treatment versus need for bypass which would require the vein for harvest and so we will not pursue any vein treatment prior to this.  He will continue to take his aspirin we will need to prescribe statin if any intervention is undertaken.  I discussed the risk and benefits and alternatives as well as expected outcomes and he demonstrates good understanding.     Edward Sandy MD [facility]

## 2021-03-23 NOTE — H&P (View-Only) (Signed)
Patient ID: Edward [name], male   DOB: 1970/12/29, 50 y.o.   MRN: [id number]  Reason for Consult: Follow-up   Referred by [name], MD  Subjective:     HPI:  Edward [name] is a 50 y.o. male well-known to me from previous upper extremity dialysis access.  He has a previous right below-knee amputation walks with help of a prosthetic.  On the left side he has transmetatarsal amputation which is well-healed.  He has wounds on his left leg which have failed to heal and are quite weepy.  He does walk with help of a cane.  He denies any issues with his transmetatarsal amputation.  He does not have fevers or chills.  He is unsure of any previous lower extremity vascular inventions.  He does take aspirin daily no statin.  Past Medical History:  Diagnosis Date   Allergy    Anemia    getting Venofer with dialysis   Anxiety    self reported, sometimes-situational anxiety   Arthritis    bilateral hands/LEFT hip   CHF (congestive heart failure) (HCC)    Chronic kidney disease    t th s dialysis- [place] ---ESRD   Complication of anesthesia    slow to go to sleep   Coronary artery disease    Depression    self reported, sometimes-situational depression   Diabetes mellitus    type II- on meds   Dyspnea    sometimes- fluid   GERD (gastroesophageal reflux disease)    on meds   Hypertension    no longer on medication since starting on Hemodialysis   Myocardial infarction ([place]) 2019   Sleep apnea    Family History  Problem Relation Age of Onset   Diabetes Other    Pancreatic cancer Mother 24   Colon cancer Neg Hx    Colon polyps Neg Hx    Esophageal cancer Neg Hx    Rectal cancer Neg Hx    Stomach cancer Neg Hx    Past Surgical History:  Procedure Laterality Date   A/V FISTULAGRAM Left 03/06/2020   Procedure: A/V FISTULAGRAM;  Surgeon: Waynetta Sandy, MD;  Location: [facility];  Service: Cardiovascular;  Laterality: Left;   AMPUTATION Left 05/10/2016    Procedure: Left Transmetatarsal Amputation;  Surgeon: [name], MD;  Location: [place];  Service: Orthopedics;  Laterality: Left;   AMPUTATION Right 05/16/2018   Procedure: PARTIAL RAY AMPUTATION FIFTH TOE RIGHT FOOT;  Surgeon: [name], DPM;  Location: [place];  Service: Podiatry;  Laterality: Right;   AMPUTATION Right 06/17/2018   Procedure: AMPUTATION 4th RAY Right Foot;  Surgeon: Trula Slade, DPM;  Location: [place];  Service: Podiatry;  Laterality: Right;   AMPUTATION Right 06/19/2018   Procedure: RIGHT BELOW KNEE AMPUTATION;  Surgeon: [name], MD;  Location: [place];  Service: Orthopedics;  Laterality: Right;   APPLICATION OF WOUND VAC Right 06/19/2018   Procedure: APPLICATION OF WOUND VAC;  Surgeon: [name], MD;  Location: [place];  Service: Orthopedics;  Laterality: Right;   AV FISTULA PLACEMENT Left 11/25/2019   Procedure: LEFT ARM Brachiocephalic  ARTERIOVENOUS (AV) FISTULA CREATION;  Surgeon: Rosetta Posner, MD;  Location: [place];  Service: Vascular;  Laterality: Left;   [place] Left 07/19/2020   Procedure: LEFT SECOND STAGE [place];  Surgeon: Waynetta Sandy, MD;  Location: [place];  Service: Vascular;  Laterality: Left;   CARDIAC CATHETERIZATION  07/29/2018   CIRCUMCISION  09/02/2011   Procedure: CIRCUMCISION ADULT;  Surgeon: Molli Hazard, MD;  Location: [place];  Service: Urology;  Laterality: N/A;   CORONARY STENT INTERVENTION N/A 07/29/2018   Procedure: CORONARY STENT INTERVENTION;  Surgeon: Leonie Man, MD;  Location: [facility];  Service: Cardiovascular;  Laterality: N/A;   I & D EXTREMITY  09/02/2011   Procedure: IRRIGATION AND DEBRIDEMENT EXTREMITY;  Surgeon: Theodoro Kos, DO;  Location: [place];  Service: Plastics;  Laterality: N/A;   INCISION AND DRAINAGE OF WOUND  08/12/2011   Procedure: IRRIGATION AND DEBRIDEMENT WOUND;  Surgeon: Zenovia Jarred, MD;   Location: [place];  Service: General;;  perineum   INSERTION OF DIALYSIS CATHETER N/A 11/25/2019   Procedure: INSERTION OF Righ Internal Jugular DIALYSIS CATHETER;  Surgeon: Rosetta Posner, MD;  Location: [place];  Service: Vascular;  Laterality: N/A;   [place]  08/12/2011   Procedure: IRRIGATION AND DEBRIDEMENT ABSCESS;  Surgeon: Molli Hazard, MD;  Location: [place];  Service: Urology;  Laterality: N/A;  irrigation and debridment perineum     IRRIGATION AND DEBRIDEMENT ABSCESS  08/14/2011   Procedure: MINOR INCISION AND DRAINAGE OF ABSCESS;  Surgeon: Molli Hazard, MD;  Location: [place];  Service: Urology;  Laterality: N/A;  Perineal Wound Debridement;Placement Bilateral Testicular Thigh Pouches   LEFT HEART CATH AND CORONARY ANGIOGRAPHY N/A 07/29/2018   Procedure: LEFT HEART CATH AND CORONARY ANGIOGRAPHY;  Surgeon: Leonie Man, MD;  Location: [facility];  Service: Cardiovascular;  Laterality: N/A;   REVISON OF ARTERIOVENOUS FISTULA Left 05/24/2020   Procedure: LEFT ARM ARTERIOVENOUS FISTULA  CONVERSION TO BASILIC VEIN FISTULA;  Surgeon: Waynetta Sandy, MD;  Location: [place];  Service: Vascular;  Laterality: Left;   TOTAL HIP ARTHROPLASTY Left 10/11/2016   Procedure: LEFT TOTAL HIP ARTHROPLASTY ANTERIOR APPROACH;  Surgeon: Mcarthur Rossetti, MD;  Location: [facility];  Service: Orthopedics;  Laterality: Left;    Short Social History:  Social History   Tobacco Use   Smoking status: Former    Packs/day: 0.25    Years: 24.00    Pack years: 6.00    Types: Cigarettes    Quit date: 11/19/2019    Years since quitting: 1.3   Smokeless tobacco: Never  Substance Use Topics   Alcohol use: Not Currently    Alcohol/week: 0.0 standard drinks    Comment: quit 2018    Allergies  Allergen Reactions   Crestor [Rosuvastatin] Rash    Also had a red eye    Current Outpatient Medications  Medication Sig Dispense Refill   aspirin EC 81 MG  tablet Take 1 tablet (81 mg total) by mouth daily. Swallow whole. 90 tablet 3   bismuth subsalicylate (PEPTO BISMOL) 262 MG/15ML suspension Take 30 mLs by mouth every 6 (six) hours as needed for indigestion.     blood glucose meter kit and supplies Dispense based on patient and insurance preference. Use up to four times daily as directed. (FOR ICD-9 250.00, 250.01). 1 each 0   cholecalciferol (VITAMIN D3) 25 MCG (1000 UNIT) tablet Take 1,000 Units by mouth Every Tuesday,Thursday,and Saturday with dialysis.      doxercalciferol (HECTOROL) 4 MCG/2ML injection Doxercalciferol (Hectorol)     insulin aspart protamine - aspart (NOVOLOG MIX 70/30 FLEXPEN) (70-30) 100 UNIT/ML FlexPen Inject 0.3 mLs (30 Units total) into the skin 2 (two) times daily with a meal. 15 mL 0  Insulin Pen Needle 31G X 5 MM MISC Use daily to inject insulin as instructed 100 each 2   linaclotide (LINZESS) 145 MCG CAPS capsule Take 1 capsule (145 mcg total) by mouth daily before breakfast. 8 capsule 0   linaclotide (LINZESS) 72 MCG capsule Take 1 capsule (72 mcg total) by mouth daily before breakfast. 8 capsule 0   Methoxy PEG-Epoetin Beta (MIRCERA IJ) Mircera     pantoprazole (PROTONIX) 40 MG tablet Take 40 mg by mouth daily.     permethrin (ELIMITE) 5 % cream Thoroughly massage cream from head to soles of feet. Leave on for 8 to 14 hours before washing off. 60 g 0   polyethylene glycol (MIRALAX) 17 g packet Take 17 g by mouth daily. 14 each 0   VELPHORO 500 MG chewable tablet Chew 500 mg by mouth 3 (three) times daily with meals.      iron sucrose (VENOFER) 20 MG/ML injection Iron Sucrose (Venofer)     Current Facility-Administered Medications  Medication Dose Route Frequency Provider Last Rate Last Admin   0.9 %  sodium chloride infusion  500 mL Intravenous Once Armbruster, Carlota Raspberry, MD        Review of Systems  Constitutional:  Constitutional negative. HENT: HENT negative.  Eyes: Eyes negative.  Cardiovascular: Positive  for leg swelling.  GI: Gastrointestinal negative.  Skin: Positive for wound.  Neurological: Neurological negative. Psychiatric: Psychiatric negative.       Objective:  Objective   Vitals:   03/23/21 1407  BP: 137/81  Pulse: 85  Resp: 20  Temp: 98.5 F (36.9 C)  SpO2: 94%  Weight: 249 lb (112.9 kg)  Height: 6' (1.829 m)   Body mass index is 33.77 kg/m.  Physical Exam HENT:     Head: Normocephalic.     Nose:     Comments: Wearing a mask Eyes:     Pupils: Pupils are equal, round, and reactive to light.  Cardiovascular:     Pulses:          Femoral pulses are 2+ on the right side and 2+ on the left side.      Popliteal pulses are 0 on the left side.  Pulmonary:     Effort: Pulmonary effort is normal.  Abdominal:     General: Abdomen is flat.     Palpations: Abdomen is soft.  Musculoskeletal:     Left lower leg: Edema present.     Comments: Well-healed left transmetatarsal amputation  Skin:    Comments: There are 3 1 to 2 cm wounds on his anterior left leg that are weeping  Neurological:     General: No focal deficit present.     Mental Status: He is alert.  Psychiatric:        Mood and Affect: Mood normal.        Behavior: Behavior normal.        Thought Content: Thought content normal.        Judgment: Judgment normal.    Data: ABI Findings:  +--------+------------------+-----+--------+--------+  Right   Rt Pressure (mmHg)IndexWaveformComment   +--------+------------------+-----+--------+--------+  [id number]                                      +--------+------------------+-----+--------+--------+   +--------+------------------+-----+--------+---------+  Left    Lt Pressure (mmHg)IndexWaveformComment    +--------+------------------+-----+--------+---------+  Brachial  HD access  +--------+------------------+-----+--------+---------+  PTA     97                0.69 biphasic            +--------+------------------+-----+--------+---------+  DP      98                0.70 biphasic           +--------+------------------+-----+--------+---------+   +-------+-----------+-----------+------------+------------+  ABI/TBIToday's ABIToday's TBIPrevious ABIPrevious TBI  +-------+-----------+-----------+------------+------------+  Right  0.7        amp        0.93        0.64          +-------+-----------+-----------+------------+------------+  Left   amp        amp        0.96        amp           +-------+-----------+-----------+------------+------------+     Previous ABI at [facility] on 01/17/17.     Summary:  Right: BKA.  Left: Resting left ankle-brachial index indicates moderate left lower  extremity arterial disease. Transmetatarsal amputation.      Assessment/Plan:     50 year old male here with wounds of his left lower extremity that appear to be mixed venous and arterial in nature.  I discussed that he will likely need compression and venous evaluation but will start with angiography to evaluate the left lower extremity from a right common femoral approach.  His testicles are embedded in his bilateral thighs from previous Fournier's gangrene surgery and we will need to avoid these at the time of femoral approach.  I discussed the possible options being endovascular treatment versus need for bypass which would require the vein for harvest and so we will not pursue any vein treatment prior to this.  He will continue to take his aspirin we will need to prescribe statin if any intervention is undertaken.  I discussed the risk and benefits and alternatives as well as expected outcomes and he demonstrates good understanding.     Waynetta Sandy MD [facility]

## 2021-04-09 ENCOUNTER — Encounter (HOSPITAL_COMMUNITY): Payer: Self-pay | Admitting: Vascular Surgery

## 2021-04-09 ENCOUNTER — Other Ambulatory Visit: Payer: Self-pay

## 2021-04-09 ENCOUNTER — Encounter (HOSPITAL_COMMUNITY)
Admission: RE | Disposition: A | Discharge status: Home / Self Care | Payer: Self-pay | Source: Home / Self Care | Attending: Vascular Surgery

## 2021-04-09 ENCOUNTER — Ambulatory Visit (HOSPITAL_COMMUNITY)
Admission: RE | Admit: 2021-04-09 | Discharge: 2021-04-09 | Disposition: A | Discharge status: Home / Self Care | Payer: Medicare Other | Attending: Vascular Surgery | Admitting: Vascular Surgery

## 2021-04-09 DIAGNOSIS — N186 End stage renal disease: Secondary | ICD-10-CM | POA: Diagnosis not present

## 2021-04-09 DIAGNOSIS — Z89422 Acquired absence of other left toe(s): Secondary | ICD-10-CM | POA: Diagnosis not present

## 2021-04-09 DIAGNOSIS — Z992 Dependence on renal dialysis: Secondary | ICD-10-CM | POA: Insufficient documentation

## 2021-04-09 DIAGNOSIS — I252 Old myocardial infarction: Secondary | ICD-10-CM | POA: Insufficient documentation

## 2021-04-09 DIAGNOSIS — I2510 Atherosclerotic heart disease of native coronary artery without angina pectoris: Secondary | ICD-10-CM | POA: Diagnosis not present

## 2021-04-09 DIAGNOSIS — I70248 Atherosclerosis of native arteries of left leg with ulceration of other part of lower left leg: Secondary | ICD-10-CM | POA: Insufficient documentation

## 2021-04-09 DIAGNOSIS — E1151 Type 2 diabetes mellitus with diabetic peripheral angiopathy without gangrene: Secondary | ICD-10-CM | POA: Insufficient documentation

## 2021-04-09 DIAGNOSIS — I509 Heart failure, unspecified: Secondary | ICD-10-CM | POA: Diagnosis not present

## 2021-04-09 DIAGNOSIS — Z96642 Presence of left artificial hip joint: Secondary | ICD-10-CM | POA: Insufficient documentation

## 2021-04-09 DIAGNOSIS — I132 Hypertensive heart and chronic kidney disease with heart failure and with stage 5 chronic kidney disease, or end stage renal disease: Secondary | ICD-10-CM | POA: Insufficient documentation

## 2021-04-09 DIAGNOSIS — L97829 Non-pressure chronic ulcer of other part of left lower leg with unspecified severity: Secondary | ICD-10-CM | POA: Insufficient documentation

## 2021-04-09 DIAGNOSIS — E1122 Type 2 diabetes mellitus with diabetic chronic kidney disease: Secondary | ICD-10-CM | POA: Insufficient documentation

## 2021-04-09 DIAGNOSIS — Z87891 Personal history of nicotine dependence: Secondary | ICD-10-CM | POA: Insufficient documentation

## 2021-04-09 DIAGNOSIS — Z888 Allergy status to other drugs, medicaments and biological substances status: Secondary | ICD-10-CM | POA: Insufficient documentation

## 2021-04-09 DIAGNOSIS — Z794 Long term (current) use of insulin: Secondary | ICD-10-CM | POA: Diagnosis not present

## 2021-04-09 DIAGNOSIS — Z79899 Other long term (current) drug therapy: Secondary | ICD-10-CM | POA: Insufficient documentation

## 2021-04-09 DIAGNOSIS — Z89511 Acquired absence of right leg below knee: Secondary | ICD-10-CM | POA: Diagnosis not present

## 2021-04-09 DIAGNOSIS — G4730 Sleep apnea, unspecified: Secondary | ICD-10-CM | POA: Diagnosis not present

## 2021-04-09 DIAGNOSIS — Z7982 Long term (current) use of aspirin: Secondary | ICD-10-CM | POA: Diagnosis not present

## 2021-04-09 DIAGNOSIS — E11622 Type 2 diabetes mellitus with other skin ulcer: Secondary | ICD-10-CM | POA: Diagnosis not present

## 2021-04-09 HISTORY — PX: ABDOMINAL AORTOGRAM W/LOWER EXTREMITY: CATH118223

## 2021-04-09 HISTORY — PX: PERIPHERAL VASCULAR BALLOON ANGIOPLASTY: CATH118281

## 2021-04-09 HISTORY — PX: PERIPHERAL VASCULAR ATHERECTOMY: CATH118256

## 2021-04-09 LAB — POCT I-STAT, CHEM 8
BUN: 39 mg/dL — ABNORMAL HIGH (ref 6–20)
Calcium, Ion: 1.13 mmol/L — ABNORMAL LOW (ref 1.15–1.40)
Chloride: 100 mmol/L (ref 98–111)
Creatinine, Ser: 8.3 mg/dL — ABNORMAL HIGH (ref 0.61–1.24)
Glucose, Bld: 139 mg/dL — ABNORMAL HIGH (ref 70–99)
HCT: 35 % — ABNORMAL LOW (ref 39.0–52.0)
Hemoglobin: 11.9 g/dL — ABNORMAL LOW (ref 13.0–17.0)
Potassium: 3.5 mmol/L (ref 3.5–5.1)
Sodium: 140 mmol/L (ref 135–145)
TCO2: 29 mmol/L (ref 22–32)

## 2021-04-09 SURGERY — ABDOMINAL AORTOGRAM W/LOWER EXTREMITY
Anesthesia: LOCAL

## 2021-04-09 MED ORDER — CLOPIDOGREL BISULFATE 75 MG PO TABS: 300.0000 mg | ORAL_TABLET | Freq: Once | ORAL | Status: DC

## 2021-04-09 MED ORDER — HYDRALAZINE HCL 20 MG/ML IJ SOLN: 5.0000 mg | INTRAMUSCULAR | Status: DC | PRN

## 2021-04-09 MED ORDER — SODIUM CHLORIDE 0.9% FLUSH: 3.0000 mL | Freq: Two times a day (BID) | INTRAVENOUS | Status: DC

## 2021-04-09 MED ORDER — FENTANYL CITRATE (PF) 100 MCG/2ML IJ SOLN
INTRAMUSCULAR | Status: AC
  Filled 2021-04-09: qty 2

## 2021-04-09 MED ORDER — CLOPIDOGREL BISULFATE 300 MG PO TABS
ORAL_TABLET | ORAL | Status: AC
  Filled 2021-04-09: qty 1

## 2021-04-09 MED ORDER — HEPARIN (PORCINE) IN NACL 1000-0.9 UT/500ML-% IV SOLN
INTRAVENOUS | Status: DC | PRN
  Administered 2021-04-09 (×2): 500 mL

## 2021-04-09 MED ORDER — HEPARIN (PORCINE) IN NACL 1000-0.9 UT/500ML-% IV SOLN
INTRAVENOUS | Status: AC
  Filled 2021-04-09: qty 500

## 2021-04-09 MED ORDER — ONDANSETRON HCL 4 MG/2ML IJ SOLN: 4.0000 mg | Freq: Four times a day (QID) | INTRAMUSCULAR | Status: DC | PRN

## 2021-04-09 MED ORDER — SODIUM CHLORIDE 0.9 % IV SOLN: 250.0000 mL | INTRAVENOUS | Status: DC | PRN

## 2021-04-09 MED ORDER — MIDAZOLAM HCL 2 MG/2ML IJ SOLN
INTRAMUSCULAR | Status: AC
  Filled 2021-04-09: qty 2

## 2021-04-09 MED ORDER — SODIUM CHLORIDE 0.9% FLUSH: 3.0000 mL | INTRAVENOUS | Status: DC | PRN

## 2021-04-09 MED ORDER — LABETALOL HCL 5 MG/ML IV SOLN
INTRAVENOUS | Status: AC
  Filled 2021-04-09: qty 4

## 2021-04-09 MED ORDER — CLOPIDOGREL BISULFATE 75 MG PO TABS: 75.0000 mg | ORAL_TABLET | Freq: Every day | ORAL | Status: DC

## 2021-04-09 MED ORDER — LABETALOL HCL 5 MG/ML IV SOLN
10.0000 mg | INTRAVENOUS | Status: DC | PRN
Start: 2021-04-09 — End: 2021-04-09

## 2021-04-09 MED ORDER — MIDAZOLAM HCL 2 MG/2ML IJ SOLN
INTRAMUSCULAR | Status: DC | PRN
  Administered 2021-04-09: 1 mg via INTRAVENOUS

## 2021-04-09 MED ORDER — CLOPIDOGREL BISULFATE 300 MG PO TABS
ORAL_TABLET | ORAL | Status: DC | PRN
  Administered 2021-04-09: 300 mg via ORAL

## 2021-04-09 MED ORDER — ACETAMINOPHEN 325 MG PO TABS: 650.0000 mg | ORAL_TABLET | ORAL | Status: DC | PRN

## 2021-04-09 MED ORDER — LIDOCAINE HCL (PF) 1 % IJ SOLN
INTRAMUSCULAR | Status: DC | PRN
  Administered 2021-04-09: 15 mL via INTRADERMAL

## 2021-04-09 MED ORDER — LIDOCAINE HCL (PF) 1 % IJ SOLN
INTRAMUSCULAR | Status: AC
  Filled 2021-04-09: qty 30

## 2021-04-09 MED ORDER — IODIXANOL 320 MG/ML IV SOLN
INTRAVENOUS | Status: DC | PRN
  Administered 2021-04-09: 85 mL via INTRA_ARTERIAL

## 2021-04-09 MED ORDER — LABETALOL HCL 5 MG/ML IV SOLN
INTRAVENOUS | Status: DC | PRN
  Administered 2021-04-09: 10 mg via INTRAVENOUS

## 2021-04-09 MED ORDER — HEPARIN SODIUM (PORCINE) 1000 UNIT/ML IJ SOLN
INTRAMUSCULAR | Status: DC | PRN
  Administered 2021-04-09: 10000 [IU] via INTRAVENOUS

## 2021-04-09 MED ORDER — CLOPIDOGREL BISULFATE 75 MG PO TABS: 75.0000 mg | ORAL_TABLET | Freq: Every day | ORAL | 11 refills | Status: DC

## 2021-04-09 MED ORDER — FENTANYL CITRATE (PF) 100 MCG/2ML IJ SOLN
INTRAMUSCULAR | Status: DC | PRN
  Administered 2021-04-09: 50 ug via INTRAVENOUS

## 2021-04-09 MED ORDER — HEPARIN SODIUM (PORCINE) 1000 UNIT/ML IJ SOLN
INTRAMUSCULAR | Status: AC
  Filled 2021-04-09: qty 1

## 2021-04-09 SURGICAL SUPPLY — 25 items
BAG SNAP BAND KOVER 36X36 (MISCELLANEOUS) ×1 IMPLANT
BALLN COYOTE ES OTW 3.5X40X145 (BALLOONS) ×3
BALLN STERLING OTW 3X220X150 (BALLOONS) ×3
BALLN STERLING OTW 5X40X135 (BALLOONS) ×3
BALLOON CYTE ES OTW 3.5X40X145 (BALLOONS) IMPLANT
BALLOON STERLING OTW 3X220X150 (BALLOONS) IMPLANT
BALLOON STERLING OTW 5X40X135 (BALLOONS) IMPLANT
CATH AURYON 4FR ATHEREC 0.9 (CATHETERS) ×1 IMPLANT
CATH CXI SUPP 2.6F 150 ANG (CATHETERS) ×1 IMPLANT
CATH OMNI FLUSH 5F 65CM (CATHETERS) ×1 IMPLANT
CLOSURE MYNX CONTROL 6F/7F (Vascular Products) ×1 IMPLANT
DCB RANGER 5.0X40 135 (BALLOONS) IMPLANT
KIT MICROPUNCTURE NIT STIFF (SHEATH) ×1 IMPLANT
KIT PV (KITS) ×3 IMPLANT
RANGER DCB 5.0X40 135 (BALLOONS) ×3
SHEATH PINNACLE 5F 10CM (SHEATH) ×1 IMPLANT
SHEATH PINNACLE 6F 10CM (SHEATH) ×1 IMPLANT
SHEATH PINNACLE ST 6F 45CM (SHEATH) ×1 IMPLANT
SHEATH PROBE COVER 6X72 (BAG) ×1 IMPLANT
SYR MEDRAD MARK V 150ML (SYRINGE) ×1 IMPLANT
TRANSDUCER W/STOPCOCK (MISCELLANEOUS) ×3 IMPLANT
TRAY PV CATH (CUSTOM PROCEDURE TRAY) ×3 IMPLANT
WIRE BENTSON .035X145CM (WIRE) ×1 IMPLANT
WIRE G V18X300CM (WIRE) ×1 IMPLANT
WIRE SPARTACORE .014X300CM (WIRE) ×1 IMPLANT

## 2021-04-09 NOTE — Interval H&P Note (Signed)
History and Physical Interval Note:  04/09/2021 7:16 AM  Edward [name]  has presented today for surgery, with the diagnosis of PAD.  The various methods of treatment have been discussed with the patient and family. After consideration of risks, benefits and other options for treatment, the patient has consented to  Procedure(s): ABDOMINAL AORTOGRAM W/LOWER EXTREMITY (N/A) as a surgical intervention.  The patient's history has been reviewed, patient examined, no change in status, stable for surgery.  I have reviewed the patient's chart and labs.  Questions were answered to the patient's satisfaction.     Servando Snare

## 2021-04-09 NOTE — Op Note (Signed)
Patient name: Edward [name] MRN: [id number] DOB: 1971-06-30 Sex: male  04/09/2021 Pre-operative Diagnosis: Left lower extremity mixed venous and arterial wounds, end-stage renal disease Post-operative diagnosis:  Same Surgeon:  Eda Paschal. Donzetta Matters, MD Procedure Performed: 1.  Ultrasound-guided cannulation right common femoral artery 2.  Aortogram with left lower extremity angiography 3.  Laser atherectomy left popliteal, left tibioperoneal trunk and left peroneal arteries with 0.9 mm laser 4.  Drug-coated balloon angioplasty left popliteal artery with 5 x 40 mm Ranger 5.  Plain balloon angioplasty of left tibioperoneal trunk with 3.5 mm coyote and plain balloon angioplasty of left peroneal artery with 3 mm [place] 6.  Mynx device closure right common femoral artery 7.  Moderate sedation with fentanyl Versed for 68 minutes   Indications: 50 year old male with history of right lower extremity amputation now has mixed venous and arterial ulceration of the left leg.  He has an ABI that is diminished.  He is end-stage renal disease dialyzes on Tuesdays Thursdays and Saturdays.  He is indicated for angiography with possible invention.  Findings: The aorta and iliac segments are free of flow-limiting stenosis.  The right leg was not evaluated given previous amputation.  Left common femoral artery without flow-limiting stenosis as is the SFA.  Popliteal artery has proximal 75% stenosis after laser arthrectomy and drug-coated balloon angioplasty this is reduced to 0% residual stenosis.  Below that the tibioperoneal trunk is quite diseased approximately 75% stenosis was difficult to pass a wire and the peroneal artery has multiple areas of at least 75% stenosis which at completion the tibioperoneal trunk and peroneal artery have 0% residual stenosis.  The posterior tibial artery has a very tight stenosis at the takeoff I could not access.  The intertibial artery is initially patent but occludes mid calf and  is reconstituted distally via the peroneal artery.  The anterior tibial artery has 1 large collateral that appears to feel the wound bed.  Hopefully with proximal revascularization of the popliteal this will increase the flow there as well as retrograde flow via the peroneal artery.  If the patient fails to heal his wounds we could consider retrograde anterior tibial artery revascularization.   Procedure:  The patient was identified in the holding area and taken to room 8.  The patient was then placed supine on the table and prepped and draped in the usual sterile fashion.  A time out was called.  Ultrasound was used to evaluate the right common femoral artery which was noted be patent and compressible.  There is no spasm percent lidocaine cannulated micropuncture needle followed by wire sheath.  An image was saved the permanent record.  We placed a Bentson wire followed by 5 [place] sheath.  Omni catheter was placed the level of L1 aortogram performed followed by crossing the bifurcation of the left common femoral artery perform left lower extremity runoff.  With the above findings we then placed a long 6 French sheath patient was fully heparinized.  We used a V 18 and CXI catheter to cross into the peroneal artery.  I difficulty crossing into the posterior tibial artery given the very tight proximal stenosis and I could not get through the anterior tibial artery.  Laser arthrectomy was performed from the popliteal artery down to the peroneal artery after we confirm intraluminal access.  We then performed 3 mm balloon angioplasty of the peroneal back through the TP trunk and the popliteal.  The popliteal was then balloon dilated with 5 mm balloon  followed by 5 mm drug-coated balloon at 3 minutes at nominal pressure.  We then performed 3-1/2 mm balloon angioplasty TP trunk.  Completion demonstrated 0% residual stenosis of any of these vessels of the popliteal, tibioperoneal trunk, peroneal.  The peroneal does fill  the anterior tibial artery.  Again if we need to we could reaccessed his anterior tibial artery from the foot to revascularize that vessel I do not think this would be amenable to antegrade revascularization.  We exchanged for a short 6 French sheath put a minx device which he tolerated well.  There was a strong signal in the anterior tibial at the ankle.  He tolerated seizure well edema complication.  Contrast: 85 cc   Edsel Shives C. Donzetta Matters, MD [facility] Office: [phone] Pager: [phone]

## 2021-04-09 NOTE — Progress Notes (Signed)
Patient and son was given discharge instructions. Both verbalized understanding. 

## 2021-04-10 ENCOUNTER — Encounter (HOSPITAL_COMMUNITY): Payer: Self-pay | Admitting: Vascular Surgery

## 2021-05-15 ENCOUNTER — Other Ambulatory Visit: Payer: Self-pay

## 2021-05-15 DIAGNOSIS — I739 Peripheral vascular disease, unspecified: Secondary | ICD-10-CM

## 2021-05-16 NOTE — Progress Notes (Deleted)
    HPI: Follow-up coronary artery disease.  Patient admitted December 2019 with CHF and non-ST elevation myocardial infarction.  Cardiac catheterization revealed an 85% circumflex, 90% mid to distal LAD (distal LAD small vessel and not amenable to PCI) and 75% first marginal.  Patient had PCI of his left circumflex with drug-eluting stent.    Patient admitted April 2021 with syncope in the setting of nausea and acute renal failure.  Potassium was 6.9.  There was a question of atrial fibrillation on his electrocardiogram but more likely junctional rhythm.  Admitted with possible TIA February 2022.  Dobutamine echocardiogram February 2022 was normal.  Echocardiogram February 2022 showed ejection fraction 50 to 55%, mild left ventricular enlargement, mild left ventricular hypertrophy, grade 2 diastolic dysfunction, moderate left atrial enlargement, moderate to severe mitral regurgitation due to restricted posterior leaflet, mild aortic insufficiency.  Carotid Dopplers in February 2022 near normal bilaterally.  Patient had laser atherectomy of the left popliteal, left tibioperoneal and left peroneal arteries as well as drug-coated balloon angioplasty of left popliteal, angioplasty of the left tibioperoneal trunk and left peroneal artery August 2022.  Since last seen,  Current Outpatient Medications  Medication Sig Dispense Refill   acetaminophen (TYLENOL) 500 MG tablet Take 500-1,000 mg by mouth every 6 (six) hours as needed (for pain.).     aspirin EC 81 MG tablet Take 1 tablet (81 mg total) by mouth daily. Swallow whole. 90 tablet 3   bismuth subsalicylate (PEPTO BISMOL) 262 MG/15ML suspension Take 30 mLs by mouth every 6 (six) hours as needed for indigestion.     blood glucose meter kit and supplies Dispense based on patient and insurance preference. Use up to four times daily as directed. (FOR ICD-9 250.00, 250.01). 1 each 0   clopidogrel (PLAVIX) 75 MG tablet Take 1 tablet (75 mg total) by mouth  daily. 30 tablet 11   doxercalciferol (HECTOROL) 4 MCG/2ML injection Doxercalciferol (Hectorol)     insulin aspart protamine - aspart (NOVOLOG MIX 70/30 FLEXPEN) (70-30) 100 UNIT/ML FlexPen Inject 0.3 mLs (30 Units total) into the skin 2 (two) times daily with a meal. (Patient taking differently: Inject 30 Units into the skin with breakfast, with lunch, and with evening meal.) 15 mL 0   Insulin Pen Needle 31G X 5 MM MISC Use daily to inject insulin as instructed 100 each 2   iron sucrose (VENOFER) 20 MG/ML injection Iron Sucrose (Venofer)     lidocaine-prilocaine (EMLA) cream Apply 1 application topically See admin instructions. Apply a small amount to access site (AVF) 1-2 hours before dialysis. Cover with occlusive dressing (saran wrap).     linaclotide (LINZESS) 145 MCG CAPS capsule Take 1 capsule (145 mcg total) by mouth daily before breakfast. (Patient not taking: Reported on 04/04/2021) 8 capsule 0   linaclotide (LINZESS) 72 MCG capsule Take 1 capsule (72 mcg total) by mouth daily before breakfast. (Patient not taking: Reported on 04/04/2021) 8 capsule 0   Methoxy PEG-Epoetin Beta (MIRCERA IJ) Mircera     pantoprazole (PROTONIX) 40 MG tablet Take 40 mg by mouth in the morning.     permethrin (ELIMITE) 5 % cream Thoroughly massage cream from head to soles of feet. Leave on for 8 to 14 hours before washing off. (Patient not taking: Reported on 04/04/2021) 60 g 0   polyethylene glycol (MIRALAX) 17 g packet Take 17 g by mouth daily. (Patient taking differently: Take 17 g by mouth daily as needed (constipation.).) 14 each 0   VELPHORO 500   MG chewable tablet Chew 500 mg by mouth 3 (three) times daily with meals.      Current Facility-Administered Medications  Medication Dose Route Frequency Provider Last Rate Last Admin   0.9 %  sodium chloride infusion  500 mL Intravenous Once Armbruster, Carlota Raspberry, MD         Past Medical History:  Diagnosis Date   Allergy    Anemia    getting Venofer with  dialysis   Anxiety    self reported, sometimes-situational anxiety   Arthritis    bilateral hands/LEFT hip   CHF (congestive heart failure) ([place])    Chronic kidney disease    t th s dialysis- [place] ---ESRD   Complication of anesthesia    slow to go to sleep   Coronary artery disease    Depression    self reported, sometimes-situational depression   Diabetes mellitus    type II- on meds   Dyspnea    sometimes- fluid   GERD (gastroesophageal reflux disease)    on meds   Hypertension    no longer on medication since starting on Hemodialysis   Myocardial infarction [facility]) 2019   Sleep apnea     Past Surgical History:  Procedure Laterality Date   A/V FISTULAGRAM Left 03/06/2020   Procedure: A/V FISTULAGRAM;  Surgeon: Waynetta Sandy, MD;  Location: [place] [facility];  Service: Cardiovascular;  Laterality: Left;   ABDOMINAL AORTOGRAM W/LOWER EXTREMITY N/A 04/09/2021   Procedure: ABDOMINAL AORTOGRAM W/LOWER EXTREMITY;  Surgeon: Waynetta Sandy, MD;  Location: [place] [facility];  Service: Cardiovascular;  Laterality: N/A;   AMPUTATION Left 05/10/2016   Procedure: Left Transmetatarsal Amputation;  Surgeon: [name], MD;  Location: [place];  Service: Orthopedics;  Laterality: Left;   AMPUTATION Right 05/16/2018   Procedure: PARTIAL RAY AMPUTATION FIFTH TOE RIGHT FOOT;  Surgeon: [name], DPM;  Location: [place];  Service: Podiatry;  Laterality: Right;   AMPUTATION Right 06/17/2018   Procedure: AMPUTATION 4th RAY Right Foot;  Surgeon: Trula Slade, DPM;  Location: [place];  Service: Podiatry;  Laterality: Right;   AMPUTATION Right 06/19/2018   Procedure: RIGHT BELOW KNEE AMPUTATION;  Surgeon: [name], MD;  Location: [place];  Service: Orthopedics;  Laterality: Right;   APPLICATION OF WOUND VAC Right 06/19/2018   Procedure: APPLICATION OF WOUND VAC;  Surgeon: [name], MD;  Location: [place];  Service: Orthopedics;  Laterality: Right;   AV FISTULA  PLACEMENT Left 11/25/2019   Procedure: LEFT ARM Brachiocephalic  ARTERIOVENOUS (AV) FISTULA CREATION;  Surgeon: Rosetta Posner, MD;  Location: [place];  Service: Vascular;  Laterality: Left;   [place] Left 07/19/2020   Procedure: LEFT SECOND STAGE [place];  Surgeon: Waynetta Sandy, MD;  Location: [place];  Service: Vascular;  Laterality: Left;   CARDIAC CATHETERIZATION  07/29/2018   CIRCUMCISION  09/02/2011   Procedure: CIRCUMCISION ADULT;  Surgeon: Molli Hazard, MD;  Location: [place];  Service: Urology;  Laterality: N/A;   CORONARY STENT INTERVENTION N/A 07/29/2018   Procedure: CORONARY STENT INTERVENTION;  Surgeon: Leonie Man, MD;  Location: [place] [facility];  Service: Cardiovascular;  Laterality: N/A;   I & D EXTREMITY  09/02/2011   Procedure: IRRIGATION AND DEBRIDEMENT EXTREMITY;  Surgeon: Theodoro Kos, DO;  Location: [place];  Service: Plastics;  Laterality: N/A;   INCISION AND DRAINAGE OF WOUND  08/12/2011   Procedure: IRRIGATION AND DEBRIDEMENT  WOUND;  Surgeon: Zenovia Jarred, MD;  Location: [place];  Service: General;;  perineum   INSERTION OF DIALYSIS CATHETER N/A 11/25/2019   Procedure: INSERTION OF [place] Internal Jugular DIALYSIS CATHETER;  Surgeon: Rosetta Posner, MD;  Location: [place];  Service: Vascular;  Laterality: N/A;   [place]  08/12/2011   Procedure: IRRIGATION AND DEBRIDEMENT ABSCESS;  Surgeon: Molli Hazard, MD;  Location: [place];  Service: Urology;  Laterality: N/A;  irrigation and debridment perineum     IRRIGATION AND DEBRIDEMENT ABSCESS  08/14/2011   Procedure: MINOR INCISION AND DRAINAGE OF ABSCESS;  Surgeon: Molli Hazard, MD;  Location: [place];  Service: Urology;  Laterality: N/A;  Perineal Wound Debridement;Placement Bilateral Testicular Thigh Pouches   LEFT HEART CATH AND CORONARY ANGIOGRAPHY N/A 07/29/2018   Procedure: LEFT HEART CATH  AND CORONARY ANGIOGRAPHY;  Surgeon: Leonie Man, MD;  Location: [place] [facility];  Service: Cardiovascular;  Laterality: N/A;   PERIPHERAL VASCULAR ATHERECTOMY Left 04/09/2021   Procedure: PERIPHERAL VASCULAR ATHERECTOMY;  Surgeon: Waynetta Sandy, MD;  Location: [place] [facility];  Service: Cardiovascular;  Laterality: Left;  TP trunk and peroneal arteries   PERIPHERAL VASCULAR BALLOON ANGIOPLASTY Left 04/09/2021   Procedure: PERIPHERAL VASCULAR BALLOON ANGIOPLASTY;  Surgeon: Waynetta Sandy, MD;  Location: [place] [facility];  Service: Cardiovascular;  Laterality: Left;  Popliteal   REVISON OF ARTERIOVENOUS FISTULA Left 05/24/2020   Procedure: LEFT ARM ARTERIOVENOUS FISTULA  CONVERSION TO BASILIC VEIN FISTULA;  Surgeon: Waynetta Sandy, MD;  Location: [place];  Service: Vascular;  Laterality: Left;   TOTAL HIP ARTHROPLASTY Left 10/11/2016   Procedure: LEFT TOTAL HIP ARTHROPLASTY ANTERIOR APPROACH;  Surgeon: Mcarthur Rossetti, MD;  Location: [facility];  Service: Orthopedics;  Laterality: Left;    Social History   Socioeconomic History   Marital status: Widowed    Spouse name: Not on file   Number of children: Not on file   Years of education: Not on file   Highest education level: Not on file  Occupational History   Occupation: Disabled  Tobacco Use   Smoking status: Former    Packs/day: 0.25    Years: 24.00    Pack years: 6.00    Types: Cigarettes    Quit date: 11/19/2019    Years since quitting: 1.4   Smokeless tobacco: Never  Vaping Use   Vaping Use: Never used  Substance and Sexual Activity   Alcohol use: Not Currently    Alcohol/week: 0.0 standard drinks    Comment: quit 2018   Drug use: No   Sexual activity: Not Currently    Birth control/protection: Condom  Other Topics Concern   Not on file  Social History Narrative   Lives w/ his 2 sons.   Social Determinants of Health   Financial Resource Strain: Not on file  Food Insecurity:  Not on file  Transportation Needs: Not on file  Physical Activity: Not on file  Stress: Not on file  Social Connections: Not on file  Intimate Partner Violence: Not on file    Family History  Problem Relation Age of Onset   Diabetes Other    Pancreatic cancer Mother 75   Colon cancer Neg Hx    Colon polyps Neg Hx    Esophageal cancer Neg Hx    Rectal cancer Neg Hx    Stomach cancer Neg Hx     ROS: no fevers or chills, productive cough, hemoptysis, dysphasia, odynophagia, melena,  hematochezia, dysuria, hematuria, rash, seizure activity, orthopnea, PND, pedal edema, claudication. Remaining systems are negative.  Physical Exam: Well-developed well-nourished in no acute distress.  Skin is warm and dry.  HEENT is normal.  Neck is supple.  Chest is clear to auscultation with normal expansion.  Cardiovascular exam is regular rate and rhythm.  Abdominal exam nontender or distended. No masses palpated. Extremities show no edema. neuro grossly intact  ECG- personally reviewed  A/P  1 coronary artery disease-no chest pain.  Continue aspirin and statin.  2 mitral regurgitation-moderate to severe on most recent echocardiogram.  3 chronic combined systolic/diastolic congestive heart failure-volume management per dialysis.  4 cardiomyopathy-LV function improved on most recent echocardiogram.  5 hypertension-blood pressure is controlled.  6 end-stage renal disease-Per nephrology.  7 peripheral vascular disease-continue aspirin and statin.   , MD     

## 2021-05-21 ENCOUNTER — Ambulatory Visit: Payer: Medicare Other | Admitting: Cardiology

## 2021-05-23 ENCOUNTER — Inpatient Hospital Stay (HOSPITAL_COMMUNITY): Admission: RE | Admit: 2021-05-23 | Payer: Medicare Other | Source: Ambulatory Visit

## 2021-06-15 IMAGING — CT CT ABD-PELV W/O CM
2 of 4 series · 16 of 46 positions shown, 18 images · non-contrast
Comparison: Renal ultrasound dated 07/12/2019.

CLINICAL DATA: ESRD, started dialysis [DATE] Eval kidneys
Asymptomatic No surgery to a/p, hx of cardiac stent No ca, +htn, +
dm Former smoker

EXAM:
CT ABDOMEN AND PELVIS WITHOUT CONTRAST
TECHNIQUE: Multidetector CT imaging of the abdomen and pelvis was performed
following the standard protocol without IV contrast.

[Series 2: routine abdomen pelvis without 5.00 br40 s3 axial · axial · non-contrast · 0.81mm/px · z∈[+1334,+1744]mm · 13 of 92 slices shown, 15 images]
[im 5/92  soft-tissue]
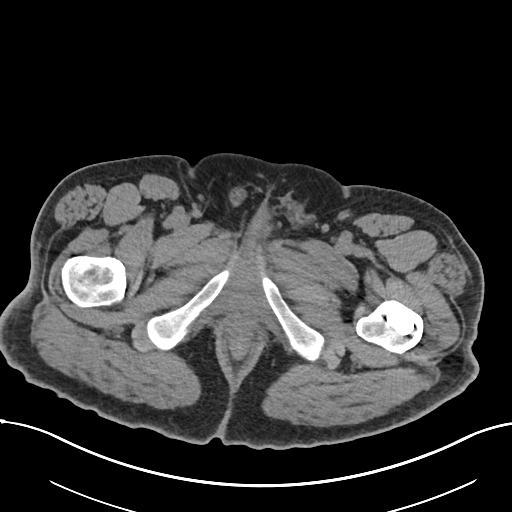
[im 5/92  bone]
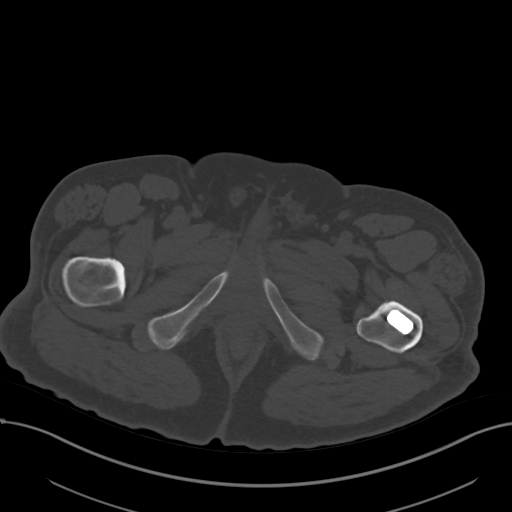
[im 14/92  soft-tissue]
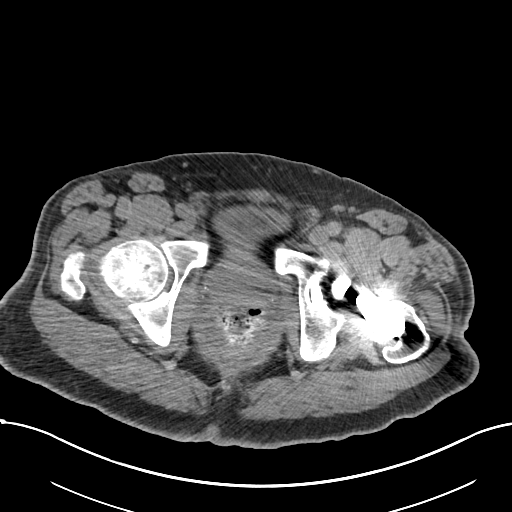
[im 19/92  soft-tissue]
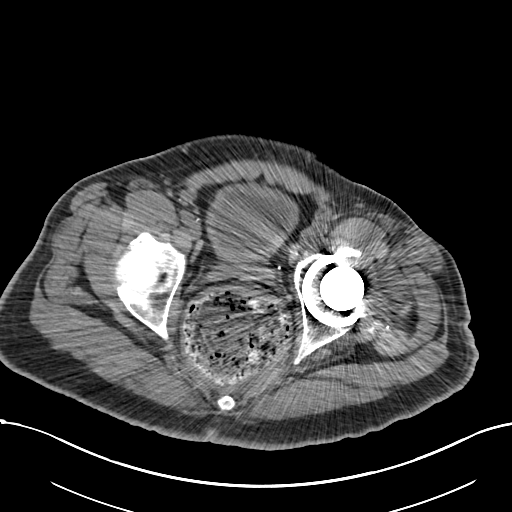
[im 28/92  soft-tissue]
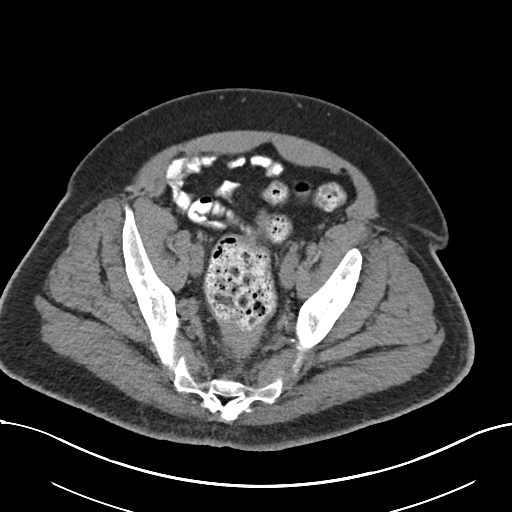
[im 32/92  soft-tissue]
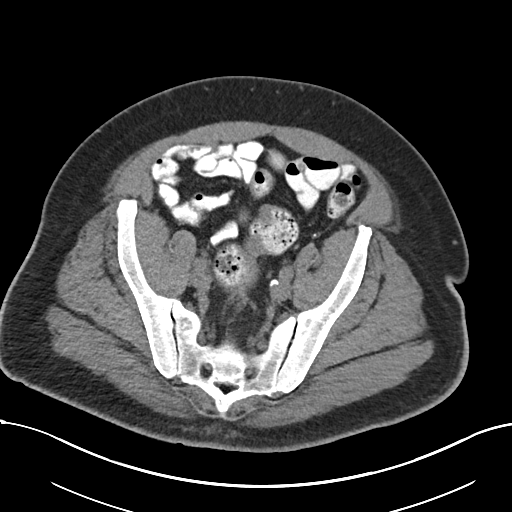
[im 41/92  soft-tissue]
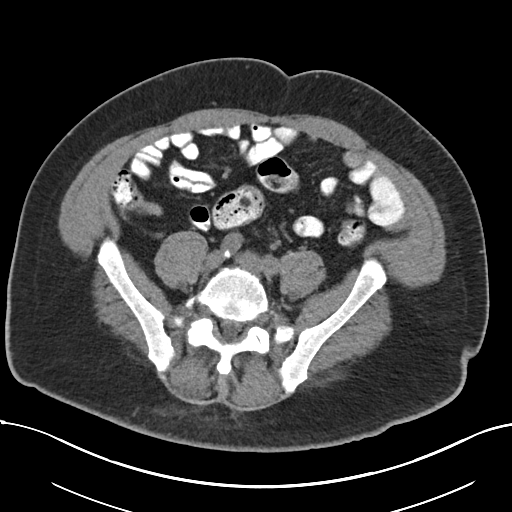
[im 46/92  soft-tissue]
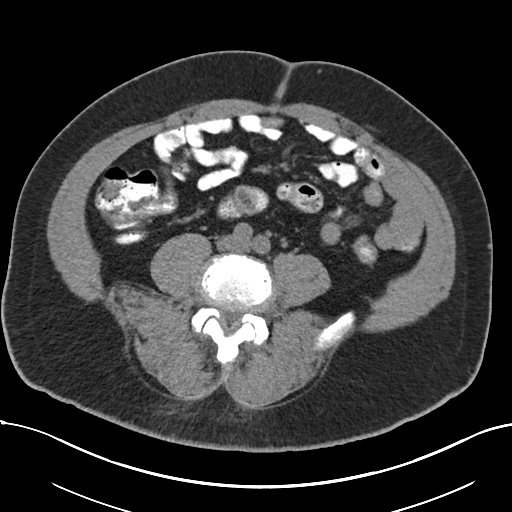
[im 51/92  soft-tissue]
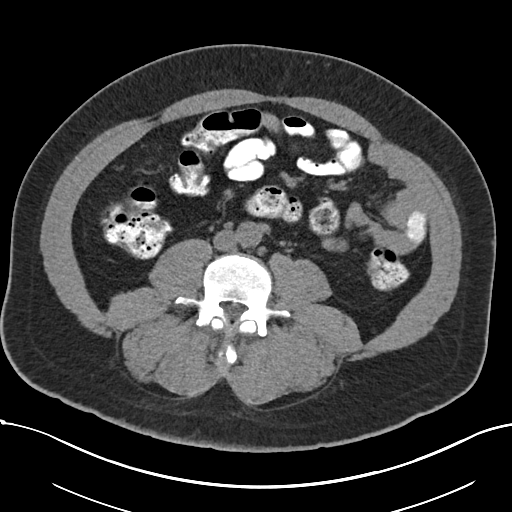
[im 60/92  soft-tissue]
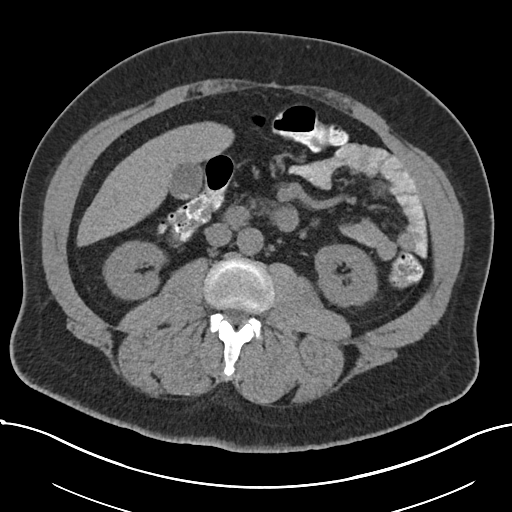
[im 60/92  bone]
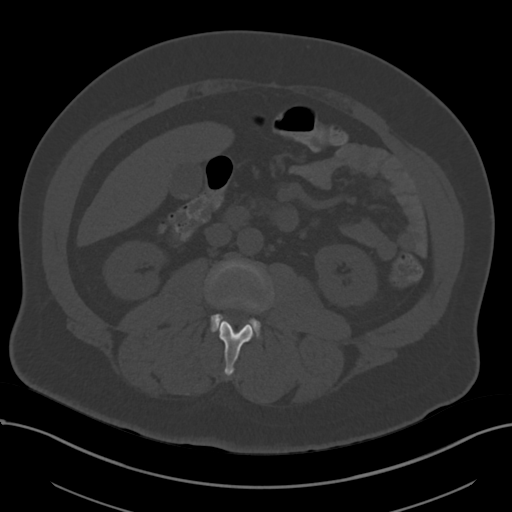
[im 64/92  soft-tissue]
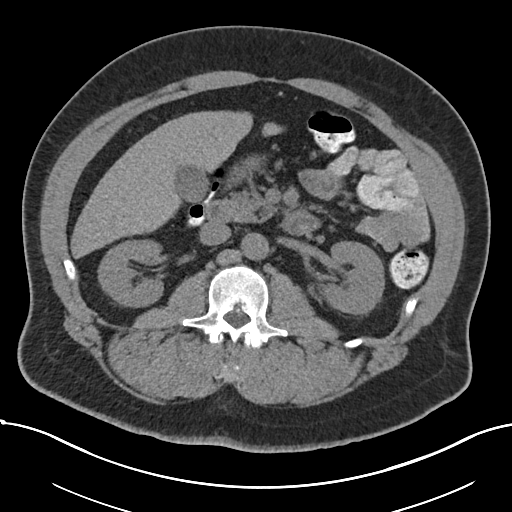
[im 73/92  soft-tissue]
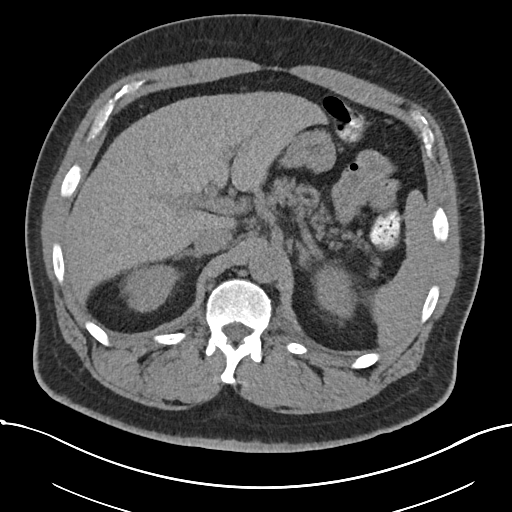
[im 78/92  soft-tissue]
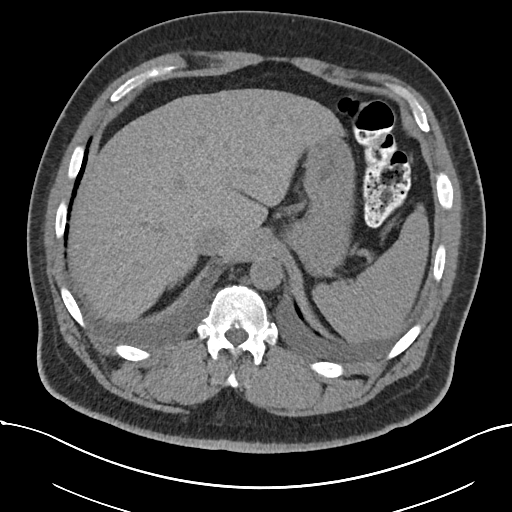
[im 87/92  soft-tissue]
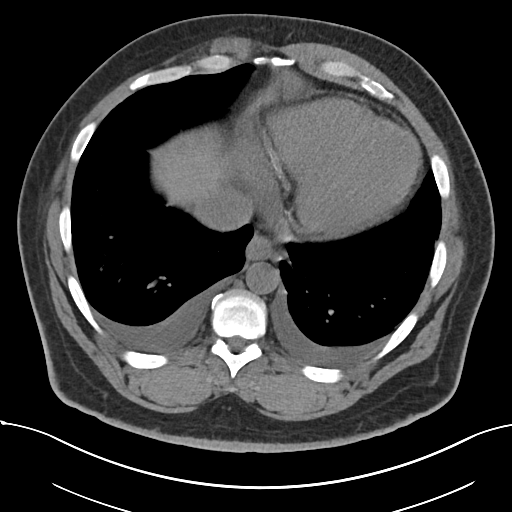

[Series 4: routine abdomen pelvis without 2.00 br40 s3 cor · coronal · non-contrast · 0.81mm/px · 3 of 205 slices shown]
[im 69/205  soft-tissue]
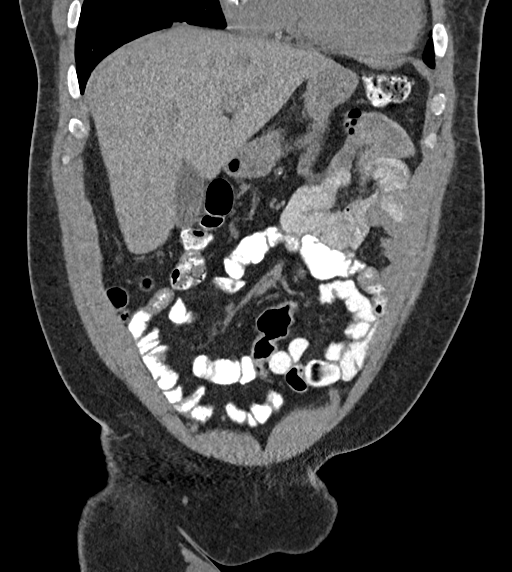
[im 91/205  soft-tissue]
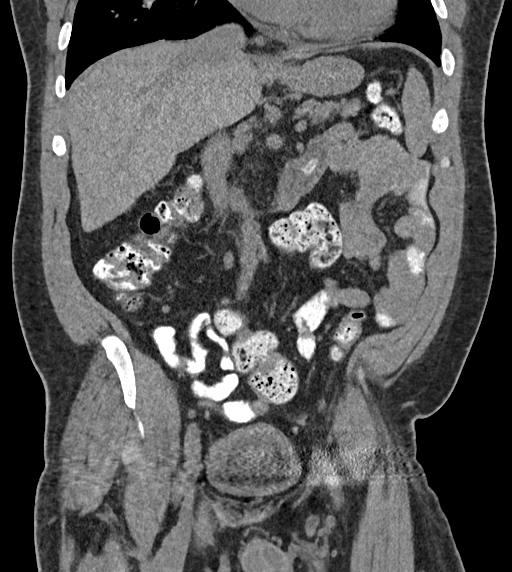
[im 114/205  soft-tissue]
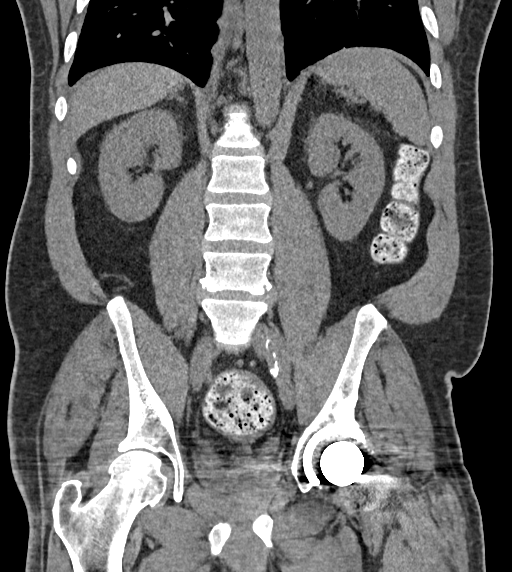

[16 of 46 positions shown; findings below may reference images not displayed]

FINDINGS: Lower chest: Bilateral pleural effusions, likely small but
incompletely imaged. Hazy opacities within the LEFT lower lobe and
lingula, asymmetric edema versus atypical pneumonia.

Hepatobiliary: No focal liver abnormality is seen. Slightly nodular
peripheral contours of the liver suggests underlying cirrhosis.
Gallbladder appears normal. No bile duct dilatation.

Pancreas: Unremarkable. No pancreatic ductal dilatation or
surrounding inflammatory changes.

Spleen: Normal in size without focal abnormality.

Adrenals/Urinary Tract: Adrenal glands appear normal. Kidneys are
unremarkable without stone or hydronephrosis. No evidence of renal
mass, although characterization is limited by the lack of
intravascular contrast. No ureteral or bladder calculi are
identified. Bladder is partially decompressed, limiting
characterization of its walls.

Stomach/Bowel: Moderate to large stool ball in the rectal vault,
distending the vault. Moderate amount of stool throughout the
remainder of the normal caliber colon. No dilated small bowel loops.
Stomach is unremarkable, partially decompressed. No evidence of
bowel wall inflammation. Appendix is normal.

Vascular/Lymphatic: Aortic atherosclerosis. No enlarged lymph nodes
are seen.

Reproductive: Prostate is unremarkable.

Other: No free fluid or abscess collection. No free intraperitoneal
air.

Musculoskeletal: LEFT hip arthroplasty hardware in place. No acute
appearing osseous abnormality.
IMPRESSION: 1. Bilateral pleural effusions, likely small but incompletely
imaged.
2. Hazy opacities within the LEFT lower lobe and lingula,
incompletely imaged, asymmetric edema versus atypical pneumonia.
3. Probable hepatic cirrhosis.
4. Moderate to large stool ball in the rectal vault, and moderate
amount of stool throughout the remainder of the normal-caliber colon
(constipation?).
5. Otherwise, no acute findings within the abdomen or pelvis. No
renal or ureteral calculi. No hydronephrosis.

Aortic Atherosclerosis (S3FGD-XNA.A).

## 2021-08-29 ENCOUNTER — Other Ambulatory Visit (INDEPENDENT_AMBULATORY_CARE_PROVIDER_SITE_OTHER): Payer: Medicare Other

## 2021-08-29 ENCOUNTER — Encounter: Payer: Self-pay | Admitting: Nurse Practitioner

## 2021-08-29 ENCOUNTER — Ambulatory Visit (INDEPENDENT_AMBULATORY_CARE_PROVIDER_SITE_OTHER): Payer: Medicare Other | Admitting: Nurse Practitioner

## 2021-08-29 VITALS — BP 122/78 | HR 60 | Ht 73.0 in | Wt 227.0 lb

## 2021-08-29 DIAGNOSIS — R748 Abnormal levels of other serum enzymes: Secondary | ICD-10-CM | POA: Diagnosis not present

## 2021-08-29 DIAGNOSIS — R12 Heartburn: Secondary | ICD-10-CM | POA: Diagnosis not present

## 2021-08-29 DIAGNOSIS — R634 Abnormal weight loss: Secondary | ICD-10-CM | POA: Diagnosis not present

## 2021-08-29 LAB — HEPATIC FUNCTION PANEL
ALT: 21 U/L (ref 0–53)
AST: 16 U/L (ref 0–37)
Albumin: 3.8 g/dL (ref 3.5–5.2)
Alkaline Phosphatase: 142 U/L — ABNORMAL HIGH (ref 39–117)
Bilirubin, Direct: 0.3 mg/dL (ref 0.0–0.3)
Total Bilirubin: 0.8 mg/dL (ref 0.2–1.2)
Total Protein: 7.3 g/dL (ref 6.0–8.3)

## 2021-08-29 MED ORDER — PANTOPRAZOLE SODIUM 40 MG PO TBEC: 40.0000 mg | DELAYED_RELEASE_TABLET | Freq: Two times a day (BID) | ORAL | 3 refills | Status: DC

## 2021-08-29 NOTE — Patient Instructions (Addendum)
If you are age 52 or older, your body mass index should be between 23-30. Your Body mass index is 29.95 kg/m. If this is out of the aforementioned range listed, please consider follow up with your Primary Care Provider.  If you are age 54 or younger, your body mass index should be between 19-25. Your Body mass index is 29.95 kg/m. If this is out of the aformentioned range listed, please consider follow up with your Primary Care Provider.   ________________________________________________________  The [facility] providers would like to encourage you to use [facility] to communicate with providers for non-urgent requests or questions.  Due to long hold times on the telephone, sending your provider a message by [facility] may be a faster and more efficient way to get a response.  Please allow 48 business hours for a response.  Please remember that this is for non-urgent requests.  _______________________________________________________  We will call you with an appointment at the hospital for an EGD with Dr. Havery Moros, possibly on Thursday, 4-6th but we recommend you having a procedure much sooner.  You were offered an EGD appointment with Dr. Lyndel Safe next week on 1-17 but refused that appointment.  You were also offered an EGD on 2-13 with Dr. Fuller Plan but refused that date as well.  Increase your Protonix to twice a day.  Please go to the lab in the basement of our building to have lab work done as you leave today. Hit "B" for basement when you get on the elevator.  When the doors open the lab is on your left.  We will call you with the results. Thank you.  Thank you for entrusting me with your care and for choosing [facility], Tye Savoy, NP-C

## 2021-08-29 NOTE — Progress Notes (Signed)
ASSESSMENT AND PLAN    # 51 yo male with multiple medical problems  presenting with progressive heartburn on daily PPI. Symptoms present for several weeks, gradually getting worse. Unintentional loss of 18 pounds but weight loss seems to have predated onset of GERD symptoms --He needs an EGD for breakthrough GERD symptoms. We offered a couple of different dates / times but he wants to wait for his primary GI , Dr. Havery Moros to perform procedure though that may not be until sometime in February. Procedure will need to be done at the hospital since patient has ESRD and is on HD.  --I plan to talk with Dr.Armbruster about whether holding Plavix for EGD is necessary, especially since patient has no dysphagia and dilation is not anticipated. He has significant PVD and is s/p R BKA and left transmetatarsal amputation. Then on 04/09/21 he required additional vascular interventions.   --Will temporarily increase Protonix to 40 mg BID. Advised to take anti-reflux medication 30 minutes before meal(s) --Anti-reflux measures discussed including avoidance of trigger foods and evening meals / bedtime snacks. If able, elevate head of bed 6-8 inches.    # History of elevated liver chemistries in February 2020. Felt to be shock liver secondary to hypotension in setting of N/V/D. Liver tests improved but hadn't returned to normal by April 2022. Additionally ASMA was mildly elevated ( ANA and  IgG normal). Though prior imaging had raised concern for cirrhosis, FibroScan at [facility] did not suggest cirrhosis. Given all of this we offered him liver biopsy which he supposedly had a [facility].   --I couldn't readily locate liver biopsy report in Care Everywhere,  -- I do not see where he has had follow-up liver chemistries, we will repeat them today  # PAD s/p R BKA and L transmetatarsal amputation.  On plavix and asa.  # CAD / MI in 2019 s/p DES  # ESRD on HD. Undergoing evaluation for transplant at [facility]. He was previously  listed but then made inactive in August 2022 due to elevated Hgb A1c.   # Gout, started Prednisone about 4 weeks ago (he thinks).    # History of adenomatous colon polyps.  Three small adenomatous colon polyps removed at time of colonoscopy June 2022  # Occasional constipation . Takes Miralax and / or Linzess as needed. May take one or the other every few months.    HISTORY OF PRESENT ILLNESS    Chief Complaint : heartburn , nausea, vomiting.   Edward [name] is a 51 y.o. male known to Dr. Havery Moros with multiple medical problems not limited to ESRD on HD, CAD s/p DES, PAD, right BKA,  CHF, diabetes, hypertension, GERD.  Additional medical history as listed in [place] .   History of elevated liver chemistries: Patient was last seen  April 2022 for s hospital follow-up regarding nausea, vomiting, abnormal liver chemistries, possible cholecystitis. General  Surgery evaluated, had low suspicion for cholecystitis.  There was also question of cirrhosis on imaging but a FibroScan at [facility] done for renal transplant evaluation had not suggestted cirrhosis.  We felt he probably had shock liver secondary to hypotension . Our outpatient plan was to repeat liver chemistries,  check autoimmune markers to look for causes of possible chronic liver disease.  He was given samples of Linzess for constipation and he was scheduled for a screening colonoscopy.  Follow-up liver tests were still elevated but improved.  He had a very mildly elevated smooth muscle antibody but his IgG  and ANA were negative so autoimmune hepatitis was not felt likely.  Given persistent elevation in liver enzymes we offered liver biopsy versus just continue to monitor.  He was interested in pursuing liver biopsy.  A couple of adenomatous colon polyps were removed at time of colonoscopy.  Patient says he had the liver biopsy done at [facility].   INTERVAL HISTORY   Patient is here for evaluation of severe heartburn. He was was started on Protonix a  year ago ( around the time he was hospitalized with nausea/ vomiting).  He has never really had significant GERD symptoms until now. Still taking daily protonix but over the last month he has been having progressive GERD symptoms, mainly heartburn but some regurgitation. He is NOT having any dysphagia. He is having nausea / vomiting but most of that seems to occur when having severe heartburn. Bismuth doesn't help. He hasn't changed diet or gained weight so he feels unsure about why the sudden increase in symptoms.  He does recall starting prednisone about a month ago for gout .  His weight is down about 18 pounds since late August.    He has occasional constipation treated with Linzess and/or MiraLAX as needed .   Data Reviewed:  ECHO - 09/27/20 EF 50-55%,  1. Abnormal septal motion Distal septal / apical hypokinesis . Left ventricular ejection fraction, by estimation, is 50 to 55%. The left ventricle has low normal function. The left ventricle demonstrates regional wall motion abnormalities (see scoring diagram/findings for description). The left ventricular internal cavity size was mildly dilated. There is mild left ventricular hypertrophy. Left ventricular diastolic parameters are consistent with Grade II diastolic dysfunction (pseudonormalization). Elevated left ventricular end-diastolic pressure.   2. Right ventricular systolic function is normal. The right ventricular size is normal. There is moderately elevated pulmonary artery systolic pressure.   3. Left atrial size was moderately dilated.   4. The pericardial effusion is posterior to the left ventricle.   5. Carpentier class 3B restricted posterior leaflet motion . The mitral valve is abnormal. Moderate to severe mitral valve regurgitation. No evidence of mitral stenosis.   6. The aortic valve is tricuspid. Aortic valve regurgitation is mild. No aortic stenosis is present.   7. The inferior vena cava is normal in size with greater than 50%  respiratory variability, suggesting right atrial pressure of 3 mmHg.   Dobutamine Stress Echo- 09/19/20, EF 60% IMPRESSIONS   1. This is a negative stress echocardiogram for ischemia.   2. This is a low risk study. Echo 09/27/20 EF 50-55% ( [facility])   PREVIOUS ENDOSCOPIC EVALUATIONS / PERTINENT STUDIES:   June 2022 screening colonoscopy  --One 5-7 mm ascending colon polyp was removed from ascending colon. A 3 to 4 mm polyp was removed from transverse colon.  A 3 mm polyp was removed from the sigmoid colon.  Diverticulosis in the entire colon.  Exam otherwise normal --Polyps path - 3 tubular adenomas   Current Medications, Allergies, Past Medical History, Past Surgical History, Family History and Social History were reviewed in [employer] record.     Current Outpatient Medications  Medication Sig Dispense Refill   acetaminophen (TYLENOL) 500 MG tablet Take 500-1,000 mg by mouth every 6 (six) hours as needed (for pain.).     aspirin EC 81 MG tablet Take 1 tablet (81 mg total) by mouth daily. Swallow whole. 90 tablet 3   bismuth subsalicylate (PEPTO BISMOL) 262 MG/15ML suspension Take 30 mLs by mouth every 6 (six)  hours as needed for indigestion.     blood glucose meter kit and supplies Dispense based on patient and insurance preference. Use up to four times daily as directed. (FOR ICD-9 250.00, 250.01). 1 each 0   clopidogrel (PLAVIX) 75 MG tablet Take 1 tablet (75 mg total) by mouth daily. 30 tablet 11   insulin aspart protamine - aspart (NOVOLOG MIX 70/30 FLEXPEN) (70-30) 100 UNIT/ML FlexPen Inject 0.3 mLs (30 Units total) into the skin 2 (two) times daily with a meal. (Patient taking differently: Inject 30 Units into the skin with breakfast, with lunch, and with evening meal.) 15 mL 0   Insulin Pen Needle 31G X 5 MM MISC Use daily to inject insulin as instructed 100 each 2   iron sucrose (VENOFER) 20 MG/ML injection Iron Sucrose (Venofer)     lidocaine-prilocaine (EMLA)  cream Apply 1 application topically See admin instructions. Apply a small amount to access site (AVF) 1-2 hours before dialysis. Cover with occlusive dressing (saran wrap).     linaclotide (LINZESS) 145 MCG CAPS capsule Take 1 capsule (145 mcg total) by mouth daily before breakfast. (Patient not taking: Reported on 04/04/2021) 8 capsule 0   linaclotide (LINZESS) 72 MCG capsule Take 1 capsule (72 mcg total) by mouth daily before breakfast. (Patient not taking: Reported on 04/04/2021) 8 capsule 0   Methoxy PEG-Epoetin Beta (MIRCERA IJ) Mircera     pantoprazole (PROTONIX) 40 MG tablet Take 40 mg by mouth in the morning.     permethrin (ELIMITE) 5 % cream Thoroughly massage cream from head to soles of feet. Leave on for 8 to 14 hours before washing off. (Patient not taking: Reported on 04/04/2021) 60 g 0   polyethylene glycol (MIRALAX) 17 g packet Take 17 g by mouth daily. (Patient taking differently: Take 17 g by mouth daily as needed (constipation.).) 14 each 0   VELPHORO 500 MG chewable tablet Chew 500 mg by mouth 3 (three) times daily with meals.      Current Facility-Administered Medications  Medication Dose Route Frequency Provider Last Rate Last Admin   0.9 %  sodium chloride infusion  500 mL Intravenous Once Armbruster, Carlota Raspberry, MD        Review of Systems: No shortness of breath. No urinary complaints. Positive for joint pain  PHYSICAL EXAM :    Wt Readings from Last 3 Encounters:  04/09/21 245 lb (111.1 kg)  03/23/21 249 lb (112.9 kg)  02/14/21 249 lb (112.9 kg)    BP 122/78 (BP Location: Right Arm, Patient Position: Sitting, Cuff Size: Normal)    Pulse 60    Ht 6' 1"  (1.854 m)    Wt 227 lb (103 kg)    BMI 29.95 kg/m  Constitutional:  Generally well appearing male in no acute distress. Psychiatric: Pleasant. Normal mood and affect. Behavior is normal. EENT: Pupils normal.  Conjunctivae are normal. No scleral icterus. Neck supple.  Cardiovascular: Normal rate, regular rhythm. No  edema Pulmonary/chest: Effort normal and breath sounds normal. No wheezing, rales or rhonchi. Abdominal: Soft, nondistended, nontender. Bowel sounds active throughout. There are no masses palpable. No hepatomegaly. Neurological: Alert and oriented to person place and time. Skin: Skin is warm and dry. No rashes noted.  Tye Savoy, NP  08/29/2021, 12:44 PM  Cc:  [name], MD

## 2021-08-30 NOTE — Progress Notes (Signed)
Agree with assessment and plan as outlined.  Agree that he needs an EGD for symptoms. This can be done ON plavix for a diagnostic exam, do not need to hold it for this exam. Question is the timing - I have no openings until April, one of my partners can accommodate him sooner, however if he wants to do this exam with me I will try to find an open date where I can fit him in but if not he may need to wait a few months.  Jan, you mentioned this to me yesterday, let's look at my schedule and see if any other options we can find to do this case at the hospital for him. Thanks

## 2021-09-06 ENCOUNTER — Emergency Department (HOSPITAL_COMMUNITY)
Admission: EM | Admit: 2021-09-06 | Discharge: 2021-09-06 | Disposition: A | Discharge status: Home / Self Care | Payer: Medicare Other | Attending: Emergency Medicine | Admitting: Emergency Medicine

## 2021-09-06 ENCOUNTER — Emergency Department (HOSPITAL_COMMUNITY): Payer: Medicare Other

## 2021-09-06 ENCOUNTER — Other Ambulatory Visit: Payer: Self-pay

## 2021-09-06 DIAGNOSIS — R001 Bradycardia, unspecified: Secondary | ICD-10-CM | POA: Diagnosis not present

## 2021-09-06 DIAGNOSIS — E162 Hypoglycemia, unspecified: Secondary | ICD-10-CM | POA: Diagnosis not present

## 2021-09-06 DIAGNOSIS — T68XXXA Hypothermia, initial encounter: Secondary | ICD-10-CM | POA: Insufficient documentation

## 2021-09-06 DIAGNOSIS — Z7982 Long term (current) use of aspirin: Secondary | ICD-10-CM | POA: Diagnosis not present

## 2021-09-06 DIAGNOSIS — Z7902 Long term (current) use of antithrombotics/antiplatelets: Secondary | ICD-10-CM | POA: Insufficient documentation

## 2021-09-06 DIAGNOSIS — I517 Cardiomegaly: Secondary | ICD-10-CM | POA: Diagnosis not present

## 2021-09-06 DIAGNOSIS — I441 Atrioventricular block, second degree: Secondary | ICD-10-CM | POA: Insufficient documentation

## 2021-09-06 DIAGNOSIS — X31XXXA Exposure to excessive natural cold, initial encounter: Secondary | ICD-10-CM | POA: Insufficient documentation

## 2021-09-06 LAB — URINALYSIS, ROUTINE W REFLEX MICROSCOPIC
Bilirubin Urine: NEGATIVE
Glucose, UA: 100 mg/dL — AB
Ketones, ur: NEGATIVE mg/dL
Nitrite: NEGATIVE
Protein, ur: >300 mg/dL — AB
Specific Gravity, Urine: >1.03 — ABNORMAL HIGH (ref 1.005–1.030)
pH: 5.5 (ref 5.0–8.0)

## 2021-09-06 LAB — CBG MONITORING, ED
Glucose-Capillary: 114 mg/dL — ABNORMAL HIGH (ref 70–99)
Glucose-Capillary: 103 mg/dL — ABNORMAL HIGH (ref 70–99)

## 2021-09-06 LAB — CBC WITH DIFFERENTIAL/PLATELET
Abs Immature Granulocytes: 0.03 10*3/uL (ref 0.00–0.07)
Basophils Absolute: 0.1 10*3/uL (ref 0.0–0.1)
Basophils Relative: 1 %
Eosinophils Absolute: 0.2 10*3/uL (ref 0.0–0.5)
Eosinophils Relative: 3 %
HCT: 35.8 % — ABNORMAL LOW (ref 39.0–52.0)
Hemoglobin: 11.7 g/dL — ABNORMAL LOW (ref 13.0–17.0)
Immature Granulocytes: 1 %
Lymphocytes Relative: 10 %
Lymphs Abs: 0.7 10*3/uL (ref 0.7–4.0)
MCH: 31.5 pg (ref 26.0–34.0)
MCHC: 32.7 g/dL (ref 30.0–36.0)
MCV: 96.5 fL (ref 80.0–100.0)
Monocytes Absolute: 0.8 10*3/uL (ref 0.1–1.0)
Monocytes Relative: 12 %
Neutro Abs: 4.9 10*3/uL (ref 1.7–7.7)
Neutrophils Relative %: 73 %
Platelets: 181 10*3/uL (ref 150–400)
RBC: 3.71 MIL/uL — ABNORMAL LOW (ref 4.22–5.81)
RDW: 17.1 % — ABNORMAL HIGH (ref 11.5–15.5)
WBC: 6.6 10*3/uL (ref 4.0–10.5)
nRBC: 0 % (ref 0.0–0.2)

## 2021-09-06 LAB — MAGNESIUM: Magnesium: 2.6 mg/dL — ABNORMAL HIGH (ref 1.7–2.4)

## 2021-09-06 LAB — COMPREHENSIVE METABOLIC PANEL
ALT: 14 U/L (ref 0–44)
AST: 25 U/L (ref 15–41)
Albumin: 2.9 g/dL — ABNORMAL LOW (ref 3.5–5.0)
Alkaline Phosphatase: 95 U/L (ref 38–126)
Anion gap: 19 — ABNORMAL HIGH (ref 5–15)
BUN: 43 mg/dL — ABNORMAL HIGH (ref 6–20)
CO2: 22 mmol/L (ref 22–32)
Calcium: 9.1 mg/dL (ref 8.9–10.3)
Chloride: 95 mmol/L — ABNORMAL LOW (ref 98–111)
Creatinine, Ser: 8.04 mg/dL — ABNORMAL HIGH (ref 0.61–1.24)
GFR, Estimated: 7 mL/min — ABNORMAL LOW (ref >60)
Glucose, Bld: 113 mg/dL — ABNORMAL HIGH (ref 70–99)
Potassium: 3.4 mmol/L — ABNORMAL LOW (ref 3.5–5.1)
Sodium: 136 mmol/L (ref 135–145)
Total Bilirubin: 1.3 mg/dL — ABNORMAL HIGH (ref 0.3–1.2)
Total Protein: 6.7 g/dL (ref 6.5–8.1)

## 2021-09-06 LAB — URINALYSIS, MICROSCOPIC (REFLEX)

## 2021-09-06 LAB — I-STAT VENOUS BLOOD GAS, ED
Acid-Base Excess: 3 mmol/L — ABNORMAL HIGH (ref 0.0–2.0)
Bicarbonate: 26.6 mmol/L (ref 20.0–28.0)
Calcium, Ion: 1.02 mmol/L — ABNORMAL LOW (ref 1.15–1.40)
HCT: 37 % — ABNORMAL LOW (ref 39.0–52.0)
Hemoglobin: 12.6 g/dL — ABNORMAL LOW (ref 13.0–17.0)
O2 Saturation: 99 %
Potassium: 3.4 mmol/L — ABNORMAL LOW (ref 3.5–5.1)
Sodium: 135 mmol/L (ref 135–145)
TCO2: 28 mmol/L (ref 22–32)
pCO2, Ven: 37.7 mmHg — ABNORMAL LOW (ref 44.0–60.0)
pH, Ven: 7.456 — ABNORMAL HIGH (ref 7.250–7.430)
pO2, Ven: 132 mmHg — ABNORMAL HIGH (ref 32.0–45.0)

## 2021-09-06 LAB — LACTIC ACID, PLASMA: Lactic Acid, Venous: 2.5 mmol/L (ref 0.5–1.9)

## 2021-09-06 LAB — RAPID URINE DRUG SCREEN, HOSP PERFORMED
Amphetamines: NOT DETECTED
Barbiturates: NOT DETECTED
Benzodiazepines: NOT DETECTED
Cocaine: NOT DETECTED
Opiates: NOT DETECTED
Tetrahydrocannabinol: NOT DETECTED

## 2021-09-06 LAB — TSH: TSH: 0.985 u[IU]/mL (ref 0.350–4.500)

## 2021-09-06 LAB — TROPONIN I (HIGH SENSITIVITY)
Troponin I (High Sensitivity): 84 ng/L — ABNORMAL HIGH (ref <18)
Troponin I (High Sensitivity): 74 ng/L — ABNORMAL HIGH (ref <18)

## 2021-09-06 LAB — AMMONIA: Ammonia: 46 umol/L — ABNORMAL HIGH (ref 9–35)

## 2021-09-06 NOTE — ED Provider Notes (Signed)
[facility] EMERGENCY DEPARTMENT Provider Note   CSN: [id number] Arrival date & time: 09/06/21  0222     History  Chief Complaint  Patient presents with   Fatigue    Edward [name] is a 51 y.o. male.  51 yo M here with AMS. Initially cbg was 30. Improved now. MS somewhat waxing and waning.  No recent illnesses.  Was feeling okay prior to this.  When his sons arrived and they stated that he felt like his blood sugar was low so ate some snacks with and went to the bathroom and screaming for help.  When they went in there he was standing up and he was shaking and diaphoretic so they called EMS.  On EMS arrival his blood sugar was low they supplemented it and he still was not quite acting like himself so that is why they brought him here for further evaluation.  They also deny any recent illnesses.  They state that he is very compliant with his dialysis.  No other associated symptoms       Home Medications Prior to Admission medications   Medication Sig Start Date End Date Taking? Authorizing Provider  acetaminophen (TYLENOL) 500 MG tablet Take 500-1,000 mg by mouth every 6 (six) hours as needed for moderate pain or headache.   Yes [provider]  allopurinol (ZYLOPRIM) 100 MG tablet Take 100 mg by mouth daily. 08/30/21  Yes [provider]  ampicillin (PRINCIPEN) 500 MG capsule Take 500 mg by mouth See admin instructions. Bid x 15 days   Yes [provider]  aspirin EC 81 MG tablet Take 1 tablet (81 mg total) by mouth daily. Swallow whole. 03/14/20  Yes Lelon Perla, MD  bismuth subsalicylate (PEPTO BISMOL) 262 MG/15ML suspension Take 30 mLs by mouth every 6 (six) hours as needed for indigestion.   Yes [provider]  clopidogrel (PLAVIX) 75 MG tablet Take 1 tablet (75 mg total) by mouth daily. 04/09/21 04/09/22 Yes Waynetta Sandy, MD  gabapentin (NEURONTIN) 100 MG capsule Take 100 mg by mouth 3 (three) times daily.  08/09/21  Yes [provider]  ibuprofen (ADVIL) 200 MG tablet Take 600 mg by mouth every 6 (six) hours as needed for headache or moderate pain.   Yes [provider]  insulin aspart protamine - aspart (NOVOLOG MIX 70/30 FLEXPEN) (70-30) 100 UNIT/ML FlexPen Inject 0.3 mLs (30 Units total) into the skin 2 (two) times daily with a meal. Patient taking differently: Inject 30 Units into the skin with breakfast, with lunch, and with evening meal. 09/29/20  Yes Lavina Hamman, MD  lidocaine-prilocaine (EMLA) cream Apply 1 application topically See admin instructions. Apply a small amount to access site (AVF) 1-2 hours before dialysis. Cover with occlusive dressing (saran wrap). 03/22/21  Yes [provider]  linaclotide (LINZESS) 72 MCG capsule Take 1 capsule (72 mcg total) by mouth daily before breakfast. 12/13/20  Yes Armbruster, Carlota Raspberry, MD  pantoprazole (PROTONIX) 40 MG tablet Take 1 tablet (40 mg total) by mouth in the morning and at bedtime. 08/29/21  Yes Willia Craze, NP  polyethylene glycol (MIRALAX) 17 g packet Take 17 g by mouth daily. Patient taking differently: Take 17 g by mouth daily as needed (constipation.). 12/13/20  Yes Armbruster, Carlota Raspberry, MD  predniSONE (DELTASONE) 20 MG tablet Take 1 tablet by mouth in the morning and at bedtime. 08/14/21  Yes [provider]  VELPHORO 500 MG chewable tablet Chew 500 mg  by mouth 3 (three) times daily with meals.  06/14/20  Yes [provider]  blood glucose meter kit and supplies Dispense based on patient and insurance preference. Use up to four times daily as directed. (FOR ICD-9 250.00, 250.01). 07/01/18   Angiulli, Lavon Paganini, PA-C  Insulin Pen Needle 31G X 5 MM MISC Use daily to inject insulin as instructed 07/01/18   Angiulli, Lavon Paganini, PA-C  linaclotide [facility]) 145 MCG CAPS capsule Take 1 capsule (145 mcg total) by mouth daily before breakfast. Patient not taking: Reported on 09/06/2021 12/13/20    Yetta Flock, MD      Allergies    Crestor [rosuvastatin]    Review of Systems   Review of Systems  Physical Exam Updated Vital Signs BP 122/70    Pulse (!) 58    Temp 97.7 F (36.5 C) (Oral)    Resp 12    SpO2 100%  Physical Exam Vitals and nursing note reviewed.  Constitutional:      Appearance: He is well-developed.  HENT:     Head: Normocephalic and atraumatic.     Nose: No congestion or rhinorrhea.     Mouth/Throat:     Mouth: Mucous membranes are moist.     Pharynx: Oropharynx is clear.  Eyes:     Pupils: Pupils are equal, round, and reactive to light.  Cardiovascular:     Rate and Rhythm: Bradycardia present. Rhythm irregular.  Pulmonary:     Effort: Pulmonary effort is normal. No respiratory distress.  Abdominal:     General: Abdomen is flat. There is no distension.  Musculoskeletal:     Cervical back: Normal range of motion.     Comments: Right BKA and left partial foot amputation  Left leg with multiple scabs and wounds.  Skin:    General: Skin is warm and dry.     Coloration: Skin is not jaundiced or pale.  Neurological:     General: No focal deficit present.     Mental Status: He is alert.    ED Results / Procedures / Treatments   Labs (all labs ordered are listed, but only abnormal results are displayed) Labs Reviewed  BLOOD CULTURE ID PANEL (REFLEXED) - BCID2 - Abnormal; Notable for the following components:      Result Value   Staphylococcus species DETECTED (*)    Staphylococcus epidermidis DETECTED (*)    All other components within normal limits  AMMONIA - Abnormal; Notable for the following components:   Ammonia 46 (*)    All other components within normal limits  COMPREHENSIVE METABOLIC PANEL - Abnormal; Notable for the following components:   Potassium 3.4 (*)    Chloride 95 (*)    Glucose, Bld 113 (*)    BUN 43 (*)    Creatinine, Ser 8.04 (*)    Albumin 2.9 (*)    Total Bilirubin 1.3 (*)    GFR, Estimated 7 (*)    Anion  gap 19 (*)    All other components within normal limits  LACTIC ACID, PLASMA - Abnormal; Notable for the following components:   Lactic Acid, Venous 2.5 (*)    All other components within normal limits  CBC WITH DIFFERENTIAL/PLATELET - Abnormal; Notable for the following components:   RBC 3.71 (*)    Hemoglobin 11.7 (*)    HCT 35.8 (*)    RDW 17.1 (*)    All other components within normal limits  URINALYSIS, ROUTINE W REFLEX MICROSCOPIC - Abnormal; Notable for the  following components:   Specific Gravity, Urine >1.030 (*)    Glucose, UA 100 (*)    Hgb urine dipstick MODERATE (*)    Protein, ur >300 (*)    Leukocytes,Ua TRACE (*)    All other components within normal limits  MAGNESIUM - Abnormal; Notable for the following components:   Magnesium 2.6 (*)    All other components within normal limits  URINALYSIS, MICROSCOPIC (REFLEX) - Abnormal; Notable for the following components:   Bacteria, UA MANY (*)    All other components within normal limits  CBG MONITORING, ED - Abnormal; Notable for the following components:   Glucose-Capillary 114 (*)    All other components within normal limits  CBG MONITORING, ED - Abnormal; Notable for the following components:   Glucose-Capillary 103 (*)    All other components within normal limits  I-STAT VENOUS BLOOD GAS, ED - Abnormal; Notable for the following components:   pH, Ven 7.456 (*)    pCO2, Ven 37.7 (*)    pO2, Ven 132.0 (*)    Acid-Base Excess 3.0 (*)    Potassium 3.4 (*)    Calcium, Ion 1.02 (*)    HCT 37.0 (*)    Hemoglobin 12.6 (*)    All other components within normal limits  TROPONIN I (HIGH SENSITIVITY) - Abnormal; Notable for the following components:   Troponin I (High Sensitivity) 84 (*)    All other components within normal limits  TROPONIN I (HIGH SENSITIVITY) - Abnormal; Notable for the following components:   Troponin I (High Sensitivity) 74 (*)    All other components within normal limits  CULTURE, BLOOD (ROUTINE  X 2)  CULTURE, BLOOD (ROUTINE X 2)  RAPID URINE DRUG SCREEN, HOSP PERFORMED  TSH    EKG EKG Interpretation  Date/Time:  Thursday September 06 2021 02:27:09 EST Ventricular Rate:  56 PR Interval:  297 QRS Duration: 118 QT Interval:  521 QTC Calculation: 503 R Axis:   -32 Text Interpretation: Nonspecific intraventricular conduction delay Consider anterior infarct appears to have a Mobitz type I block new from February Confirmed by Merrily Pew [phone]) on 09/06/2021 3:09:39 AM  Radiology CT HEAD WO CONTRAST  Result Date: 09/06/2021 CLINICAL DATA:  Altered mental status EXAM: CT HEAD WITHOUT CONTRAST TECHNIQUE: Contiguous axial images were obtained from the base of the skull through the vertex without intravenous contrast. RADIATION DOSE REDUCTION: This exam was performed according to the departmental dose-optimization program which includes automated exposure control, adjustment of the mA and/or kV according to patient size and/or use of iterative reconstruction technique. COMPARISON:  09/26/2020 FINDINGS: Brain: No evidence of acute infarction, hemorrhage, hydrocephalus, extra-axial collection or mass lesion/mass effect. Mild atrophic changes are noted. Vascular: No hyperdense vessel or unexpected calcification. Skull: Normal. Negative for fracture or focal lesion. Sinuses/Orbits: No acute finding. Other: None. IMPRESSION: Mild atrophic changes without acute intracranial abnormality. Electronically Signed   By: Inez Catalina M.D.   On: 09/06/2021 03:11   DG Chest Portable 1 View  Result Date: 09/06/2021 CLINICAL DATA:  Altered mental status and fatigue. EXAM: PORTABLE CHEST 1 VIEW COMPARISON:  November 25, 2019 FINDINGS: The right-sided venous catheter seen on the prior study has been removed. The cardiac silhouette is enlarged and unchanged in size. Decreased lung volumes are noted with subsequent crowding of the bronchovascular lung markings. Prominent suprahilar and infrahilar lung markings are  seen. The visualized skeletal structures are unremarkable. IMPRESSION: 1. Decreased lung volumes with subsequent crowding of the bronchovascular lung markings. 2. Stable cardiomegaly  with additional findings suspicious for pulmonary vascular congestion. Electronically Signed   By: Virgina Norfolk M.D.   On: 09/06/2021 03:12    Procedures .Critical Care Performed by: Merrily Pew, MD Authorized by: Merrily Pew, MD   Critical care provider statement:    Critical care time (minutes):  32   Critical care time was exclusive of:  Separately billable procedures and treating other patients and teaching time   Critical care was necessary to treat or prevent imminent or life-threatening deterioration of the following conditions:  CNS failure or compromise and circulatory failure   Critical care was time spent personally by me on the following activities:  Development of treatment plan with patient or surrogate, evaluation of patient's response to treatment, examination of patient, obtaining history from patient or surrogate, review of old charts, re-evaluation of patient's condition, pulse oximetry, ordering and performing treatments and interventions and ordering and review of laboratory studies    Medications Ordered in ED Medications - No data to display  ED Course/ Medical Decision Making/ A&P                           Medical Decision Making Amount and/or Complexity of Data Reviewed Labs: ordered. Radiology: ordered.   Initial concern for possible hypothyroidism, hypoglycemia causing all the symptoms but he does have a new type I Mobitz block on his EKG.  After period of time patient's mental status improved dramatically.  He is awake sit up in bed eating drink without difficulty.  He was totally oriented and asking normal questions.  It appears he is at his baseline.  Secondary to abnormal EKG and history of mitral regurg and stenosis discussed with cardiology Mitzie Na) who saw him and felt  like they were unrelated. Thought close cards follow up or following while in the hospital was appropriate but did not need any urgent cardiologic intervention at this time.   Patient with possible UTI. Possibly just colonized 2/2 decreased UOP. Either way different than previous UA's so will treat.  The cultures came back positive for S epidermidis, likely contaminated.   Final Clinical Impression(s) / ED Diagnoses Final diagnoses:  Hypoglycemia  Hypothermia, initial encounter  Atrioventricular block, Mobitz type 1, Wenckebach    Rx / DC Orders ED Discharge Orders     None         Marie Chow, Corene Cornea, MD 09/07/21 [phone]
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
## 2021-09-06 NOTE — ED Notes (Signed)
Pt is trying to void on side of bed, refused 2nd attempt to cath

## 2021-09-06 NOTE — ED Notes (Signed)
Pt was able to urinate for urine sample, he is able to adequately perform ADL's without assistance. Pt is agreeable to d/c plan and will follow up with todays dialysis. He denies pain at this time

## 2021-09-06 NOTE — ED Notes (Signed)
Pt given food and soda per MD

## 2021-09-07 ENCOUNTER — Telehealth (HOSPITAL_BASED_OUTPATIENT_CLINIC_OR_DEPARTMENT_OTHER): Payer: Self-pay | Admitting: Emergency Medicine

## 2021-09-07 ENCOUNTER — Other Ambulatory Visit: Payer: Self-pay | Admitting: Orthopedic Surgery

## 2021-09-07 LAB — BLOOD CULTURE ID PANEL (REFLEXED) - BCID2

## 2021-09-07 NOTE — Telephone Encounter (Signed)
Received positive blood culture results, consulted with Dr. Ralene Bathe, she will reach out to [facility].

## 2021-09-08 ENCOUNTER — Telehealth (HOSPITAL_BASED_OUTPATIENT_CLINIC_OR_DEPARTMENT_OTHER): Payer: Self-pay | Admitting: Emergency Medicine

## 2021-09-08 NOTE — Telephone Encounter (Signed)
Received call from [facility] micro lab with positive blood culture results. Consulted with Dr. Dayna Barker who initially saw pt. Dr. Dayna Barker states he followed up with pt yesterday and prescribed antibiotics. No further action needed per Dr. Dayna Barker.

## 2021-09-10 LAB — CULTURE, BLOOD (ROUTINE X 2): Special Requests: ADEQUATE

## 2021-09-10 NOTE — Progress Notes (Signed)
HPI: Follow-up coronary artery disease.  Patient admitted December 2019 with CHF and non-ST elevation myocardial infarction.  Cardiac catheterization revealed an 85% circumflex, 90% mid to distal LAD (distal LAD small vessel and not amenable to PCI) and 75% first marginal.  Patient had PCI of his left circumflex with drug-eluting stent.    Patient admitted April 2021 with syncope in the setting of nausea and acute renal failure.  Potassium was 6.9.  Echocardiogram April 2021 showed ejection fraction 40 to 45%, mild left ventricular hypertrophy, apical hypokinesis, moderate left atrial enlargement, mild mitral regurgitation, mild aortic insufficiency.  Stress echocardiogram February 2022 normal.  Carotid Dopplers February 2022 near normal bilaterally.  Patient had atherectomy of left popliteal, left tibioperoneal and left peroneal arteries as well as drug coated balloon angioplasty of left popliteal artery and plain balloon angioplasty of left tibioperoneal trunk and left peroneal artery August 2022.  Echocardiogram at [facility] November 2022 showed ejection fraction 40 to 45%, mild RV dysfunction, mild aortic insufficiency, severe mitral regurgitation, severe tricuspid regurgitation.  Patient seen in the emergency room January 19 with altered mental status.  Initial CBG was 30.  Mental status improved in the emergency room.  He was noted to have Mobitz 1 second-degree AV block.  Since last seen, patient describes occasional chest pain that he thinks is indigestion.  It is in the left chest area without radiation.  He notices it mostly after eating.  It lasts 45 minutes and improves with vomiting.  He does not have exertional chest pain.  He denies dyspnea or syncope.  He continues to have orthostatic symptoms particularly on dialysis days.  Current Outpatient Medications  Medication Sig Dispense Refill   acetaminophen (TYLENOL) 500 MG tablet Take 500-1,000 mg by mouth every 6 (six) hours as  needed for moderate pain or headache.     allopurinol (ZYLOPRIM) 100 MG tablet Take 100 mg by mouth daily.     aspirin EC 81 MG tablet Take 1 tablet (81 mg total) by mouth daily. Swallow whole. 90 tablet 3   bismuth subsalicylate (PEPTO BISMOL) 262 MG/15ML suspension Take 30 mLs by mouth every 6 (six) hours as needed for indigestion.     blood glucose meter kit and supplies Dispense based on patient and insurance preference. Use up to four times daily as directed. (FOR ICD-9 250.00, 250.01). 1 each 0   cephALEXin (KEFLEX) 500 MG capsule Take 500 mg by mouth 4 (four) times daily.     clopidogrel (PLAVIX) 75 MG tablet Take 1 tablet (75 mg total) by mouth daily. 30 tablet 11   gabapentin (NEURONTIN) 100 MG capsule Take 100 mg by mouth 3 (three) times daily.     ibuprofen (ADVIL) 200 MG tablet Take 600 mg by mouth every 6 (six) hours as needed for headache or moderate pain.     insulin aspart protamine - aspart (NOVOLOG MIX 70/30 FLEXPEN) (70-30) 100 UNIT/ML FlexPen Inject 0.3 mLs (30 Units total) into the skin 2 (two) times daily with a meal. (Patient taking differently: Inject 30 Units into the skin with breakfast, with lunch, and with evening meal.) 15 mL 0   Insulin Pen Needle 31G X 5 MM MISC Use daily to inject insulin as instructed 100 each 2   lidocaine-prilocaine (EMLA) cream Apply 1 application topically See admin instructions. Apply a small amount to access site (AVF) 1-2 hours before dialysis. Cover with occlusive dressing (saran wrap).     linaclotide (LINZESS) 72 MCG capsule Take  1 capsule (72 mcg total) by mouth daily before breakfast. 8 capsule 0   pantoprazole (PROTONIX) 40 MG tablet Take 1 tablet (40 mg total) by mouth in the morning and at bedtime. 60 tablet 3   triamcinolone ointment (KENALOG) 0.1 % Apply a small amount to affected area twice a day     VELPHORO 500 MG chewable tablet Chew 500 mg by mouth 3 (three) times daily with meals.      ampicillin (PRINCIPEN) 500 MG capsule Take  500 mg by mouth See admin instructions. Bid x 15 days (Patient not taking: Reported on 09/21/2021)     linaclotide (LINZESS) 145 MCG CAPS capsule Take 1 capsule (145 mcg total) by mouth daily before breakfast. (Patient not taking: Reported on 09/21/2021) 8 capsule 0   polyethylene glycol (MIRALAX) 17 g packet Take 17 g by mouth daily. (Patient not taking: Reported on 09/21/2021) 14 each 0   predniSONE (DELTASONE) 20 MG tablet Take 1 tablet by mouth in the morning and at bedtime. (Patient not taking: Reported on 09/21/2021)     Current Facility-Administered Medications  Medication Dose Route Frequency Provider Last Rate Last Admin   0.9 %  sodium chloride infusion  500 mL Intravenous Once Armbruster, Carlota Raspberry, MD         Past Medical History:  Diagnosis Date   Allergy    Anemia    getting Venofer with dialysis   Anxiety    self reported, sometimes-situational anxiety   Arthritis    bilateral hands/LEFT hip   CHF (congestive heart failure) ([place])    Chronic kidney disease    t th s dialysis- [place] ---ESRD   Complication of anesthesia    slow to go to sleep   Coronary artery disease    Depression    self reported, sometimes-situational depression   Diabetes mellitus    type II- on meds   Dyspnea    sometimes- fluid   GERD (gastroesophageal reflux disease)    on meds   Hypertension    no longer on medication since starting on Hemodialysis   Myocardial infarction [facility]) 2019   Sleep apnea     Past Surgical History:  Procedure Laterality Date   A/V FISTULAGRAM Left 03/06/2020   Procedure: A/V FISTULAGRAM;  Surgeon: Waynetta Sandy, MD;  Location: [place] [facility];  Service: Cardiovascular;  Laterality: Left;   ABDOMINAL AORTOGRAM W/LOWER EXTREMITY N/A 04/09/2021   Procedure: ABDOMINAL AORTOGRAM W/LOWER EXTREMITY;  Surgeon: Waynetta Sandy, MD;  Location: [place] [facility];  Service: Cardiovascular;  Laterality: N/A;   AMPUTATION Left 05/10/2016   Procedure: Left  Transmetatarsal Amputation;  Surgeon: [name], MD;  Location: [place];  Service: Orthopedics;  Laterality: Left;   AMPUTATION Right 05/16/2018   Procedure: PARTIAL RAY AMPUTATION FIFTH TOE RIGHT FOOT;  Surgeon: [name], DPM;  Location: [place];  Service: Podiatry;  Laterality: Right;   AMPUTATION Right 06/17/2018   Procedure: AMPUTATION 4th RAY Right Foot;  Surgeon: Trula Slade, DPM;  Location: [place];  Service: Podiatry;  Laterality: Right;   AMPUTATION Right 06/19/2018   Procedure: RIGHT BELOW KNEE AMPUTATION;  Surgeon: [name], MD;  Location: [place];  Service: Orthopedics;  Laterality: Right;   APPLICATION OF WOUND VAC Right 06/19/2018   Procedure: APPLICATION OF WOUND VAC;  Surgeon: [name], MD;  Location: [place];  Service: Orthopedics;  Laterality: Right;   AV FISTULA PLACEMENT Left 11/25/2019   Procedure: LEFT ARM Brachiocephalic  ARTERIOVENOUS (AV) FISTULA CREATION;  Surgeon: Rosetta Posner, MD;  Location: [place];  Service: Vascular;  Laterality: Left;   [place] Left 07/19/2020   Procedure: LEFT SECOND STAGE [place];  Surgeon: Waynetta Sandy, MD;  Location: [place];  Service: Vascular;  Laterality: Left;   CARDIAC CATHETERIZATION  07/29/2018   CIRCUMCISION  09/02/2011   Procedure: CIRCUMCISION ADULT;  Surgeon: Molli Hazard, MD;  Location: [place];  Service: Urology;  Laterality: N/A;   CORONARY STENT INTERVENTION N/A 07/29/2018   Procedure: CORONARY STENT INTERVENTION;  Surgeon: Leonie Man, MD;  Location: [place] [facility];  Service: Cardiovascular;  Laterality: N/A;   I & D EXTREMITY  09/02/2011   Procedure: IRRIGATION AND DEBRIDEMENT EXTREMITY;  Surgeon: Theodoro Kos, DO;  Location: [place];  Service: Plastics;  Laterality: N/A;   INCISION AND DRAINAGE OF WOUND  08/12/2011   Procedure: IRRIGATION AND DEBRIDEMENT WOUND;  Surgeon: Zenovia Jarred, MD;  Location: [place];   Service: General;;  perineum   INSERTION OF DIALYSIS CATHETER N/A 11/25/2019   Procedure: INSERTION OF [place] Internal Jugular DIALYSIS CATHETER;  Surgeon: Rosetta Posner, MD;  Location: [place];  Service: Vascular;  Laterality: N/A;   [place]  08/12/2011   Procedure: IRRIGATION AND DEBRIDEMENT ABSCESS;  Surgeon: Molli Hazard, MD;  Location: [place];  Service: Urology;  Laterality: N/A;  irrigation and debridment perineum     IRRIGATION AND DEBRIDEMENT ABSCESS  08/14/2011   Procedure: MINOR INCISION AND DRAINAGE OF ABSCESS;  Surgeon: Molli Hazard, MD;  Location: [place];  Service: Urology;  Laterality: N/A;  Perineal Wound Debridement;Placement Bilateral Testicular Thigh Pouches   LEFT HEART CATH AND CORONARY ANGIOGRAPHY N/A 07/29/2018   Procedure: LEFT HEART CATH AND CORONARY ANGIOGRAPHY;  Surgeon: Leonie Man, MD;  Location: [place] [facility];  Service: Cardiovascular;  Laterality: N/A;   PERIPHERAL VASCULAR ATHERECTOMY Left 04/09/2021   Procedure: PERIPHERAL VASCULAR ATHERECTOMY;  Surgeon: Waynetta Sandy, MD;  Location: [place] [facility];  Service: Cardiovascular;  Laterality: Left;  TP trunk and peroneal arteries   PERIPHERAL VASCULAR BALLOON ANGIOPLASTY Left 04/09/2021   Procedure: PERIPHERAL VASCULAR BALLOON ANGIOPLASTY;  Surgeon: Waynetta Sandy, MD;  Location: [place] [facility];  Service: Cardiovascular;  Laterality: Left;  Popliteal   REVISON OF ARTERIOVENOUS FISTULA Left 05/24/2020   Procedure: LEFT ARM ARTERIOVENOUS FISTULA  CONVERSION TO BASILIC VEIN FISTULA;  Surgeon: Waynetta Sandy, MD;  Location: [place];  Service: Vascular;  Laterality: Left;   TOTAL HIP ARTHROPLASTY Left 10/11/2016   Procedure: LEFT TOTAL HIP ARTHROPLASTY ANTERIOR APPROACH;  Surgeon: Mcarthur Rossetti, MD;  Location: [facility];  Service: Orthopedics;  Laterality: Left;    Social History   Socioeconomic History   Marital status: Widowed     Spouse name: Not on file   Number of children: Not on file   Years of education: Not on file   Highest education level: Not on file  Occupational History   Occupation: Disabled  Tobacco Use   Smoking status: Former    Packs/day: 0.25    Years: 24.00    Pack years: 6.00    Types: Cigarettes    Quit date: 11/19/2019    Years since quitting: 1.8   Smokeless tobacco: Never  Vaping Use   Vaping Use: Never used  Substance and Sexual Activity   Alcohol use: Not Currently    Alcohol/week: 0.0 standard drinks  Comment: quit 2018   Drug use: No   Sexual activity: Not Currently    Birth control/protection: Condom  Other Topics Concern   Not on file  Social History Narrative   Lives w/ his 2 sons.   Social Determinants of Health   Financial Resource Strain: Not on file  Food Insecurity: Not on file  Transportation Needs: Not on file  Physical Activity: Not on file  Stress: Not on file  Social Connections: Not on file  Intimate Partner Violence: Not on file    Family History  Problem Relation Age of Onset   Diabetes Other    Pancreatic cancer Mother 79   Colon cancer Neg Hx    Colon polyps Neg Hx    Esophageal cancer Neg Hx    Rectal cancer Neg Hx    Stomach cancer Neg Hx     ROS: no fevers or chills, productive cough, hemoptysis, dysphasia, odynophagia, melena, hematochezia, dysuria, hematuria, rash, seizure activity, orthopnea, PND, pedal edema, claudication. Remaining systems are negative.  Physical Exam: Well-developed well-nourished in no acute distress.  Skin is warm and dry.  HEENT is normal.  Neck is supple.  Chest is clear to auscultation with normal expansion.  Cardiovascular exam is regular rate and rhythm.  Abdominal exam nontender or distended. No masses palpated. Extremities status post right BKA neuro grossly intact  ECG-September 06, 2021-sinus rhythm with Mobitz 1 second-degree AV block and nonspecific ST changes.  Personally reviewed  Today's  electrocardiogram personally reviewed and shows sinus rhythm with Mobitz 1 second-degree AV block and left axis deviation.  A/P  1 coronary artery disease-Continue aspirin and resume statin.  2 hyperlipidemia-patient did not tolerate Crestor.  We will try Lipitor 80 mg daily.  Check lipids and liver in 8 weeks.  3 chest pain-symptoms sound possibly GI mediated.  Electrocardiogram shows no new ST changes.  I will arrange a [place] nuclear study to screen for ischemia.  He is also scheduled for endoscopy by gastroenterology.  4 chronic combined systolic/diastolic congestive heart failure-volume is managed with dialysis.  5 cardiomyopathy-patient has difficulties with hypotension on dialysis days.  We have therefore not added guideline directed medical therapy.  6 valvular heart disease-patient noted to have severe mitral and tricuspid regurgitation on echocardiogram at [facility].  Will likely plan repeat echocardiogram in 6 months.  7 end-stage renal disease-dialysis per nephrology.  Being considered for renal transplant.  8 peripheral vascular disease-continue aspirin and statin.  9 recent Mobitz 1 second-degree AV block-not evident on ECG today.  We will arrange 3-day monitor to further assess.  Avoid AV nodal blocking agents.  He has not had syncope.    Kirk Ruths, MD

## 2021-09-21 ENCOUNTER — Encounter: Payer: Self-pay | Admitting: Cardiology

## 2021-09-21 ENCOUNTER — Ambulatory Visit: Payer: Medicare Other

## 2021-09-21 ENCOUNTER — Other Ambulatory Visit: Payer: Self-pay

## 2021-09-21 ENCOUNTER — Ambulatory Visit (INDEPENDENT_AMBULATORY_CARE_PROVIDER_SITE_OTHER): Payer: Medicare Other | Admitting: Cardiology

## 2021-09-21 VITALS — BP 140/72 | Ht 73.0 in | Wt 233.8 lb

## 2021-09-21 DIAGNOSIS — I2510 Atherosclerotic heart disease of native coronary artery without angina pectoris: Secondary | ICD-10-CM | POA: Diagnosis not present

## 2021-09-21 DIAGNOSIS — I428 Other cardiomyopathies: Secondary | ICD-10-CM | POA: Diagnosis not present

## 2021-09-21 DIAGNOSIS — I340 Nonrheumatic mitral (valve) insufficiency: Secondary | ICD-10-CM | POA: Diagnosis not present

## 2021-09-21 DIAGNOSIS — I739 Peripheral vascular disease, unspecified: Secondary | ICD-10-CM

## 2021-09-21 DIAGNOSIS — Z9861 Coronary angioplasty status: Secondary | ICD-10-CM | POA: Diagnosis not present

## 2021-09-21 DIAGNOSIS — I441 Atrioventricular block, second degree: Secondary | ICD-10-CM

## 2021-09-21 DIAGNOSIS — R072 Precordial pain: Secondary | ICD-10-CM

## 2021-09-21 DIAGNOSIS — E7800 Pure hypercholesterolemia, unspecified: Secondary | ICD-10-CM

## 2021-09-21 MED ORDER — ATORVASTATIN CALCIUM 80 MG PO TABS: 80.0000 mg | ORAL_TABLET | Freq: Every day | ORAL | 3 refills | Status: DC

## 2021-09-21 NOTE — Patient Instructions (Signed)
Medication Instructions:   START ATORVASTATIN 80 MG ONCE DAILY  *If you need a refill on your cardiac medications before your next appointment, please call your pharmacy*   Lab Work:  Your physician recommends that you return for lab work in: [place]  If you have labs (blood work) drawn today and your tests are completely normal, you will receive your results only by: [place] (if you have MyChart) OR A paper copy in the mail If you have any lab test that is abnormal or we need to change your treatment, we will call you to review the results.   Testing/Procedures:  Your physician has requested that you have a lexiscan myoview. For further information please visit [email or web address]. Please follow instruction sheet, as given. [place] Instructions  Your physician has requested you wear a ZIO patch monitor for 3 days.  This is a single patch monitor. [employer] supplies one patch monitor per enrollment. Additional stickers are not available. Please do not apply patch if you will be having a Nuclear Stress Test,  Echocardiogram, Cardiac CT, MRI, or Chest Xray during the period you would be wearing the  monitor. The patch cannot be worn during these tests. You cannot remove and re-apply the  ZIO XT patch monitor.  Your ZIO patch monitor will be mailed 3 day USPS to your address on file. It may take 3-5 days  to receive your monitor after you have been enrolled.  Once you have received your monitor, please review the enclosed instructions. Your monitor  has already been registered assigning a specific monitor serial # to you.  Billing and Patient Assistance Program Information  We have supplied [employer] with any of your insurance information on file for billing purposes. [employer] offers a sliding scale Patient Assistance Program for patients that do not have  insurance, or whose insurance does not completely cover the cost of the  ZIO monitor.  You must apply for the Patient Assistance Program to qualify for this discounted rate.  To apply, please call [employer] at [phone], select option 4, select option 2, ask to apply for  Patient Assistance Program. Theodore Demark will ask your household income, and how many people  are in your household. They will quote your out-of-pocket cost based on that information.  [employer] will also be able to set up a 73-month, interest-free payment plan if needed.  Applying the monitor   Shave hair from upper left chest.  Hold abrader disc by orange tab. Rub abrader in 40 strokes over the upper left chest as  indicated in your monitor instructions.  Clean area with 4 enclosed alcohol pads. Let dry.  Apply patch as indicated in monitor instructions. Patch will be placed under collarbone on left  side of chest with arrow pointing upward.  Rub patch adhesive wings for 2 minutes. Remove white label marked "1". Remove the white  label marked "2". Rub patch adhesive wings for 2 additional minutes.  While looking in a mirror, press and release button in center of patch. A small green light will  flash 3-4 times. This will be your only indicator that the monitor has been turned on.  Do not shower for the first 24 hours. You may shower after the first 24 hours.  Press the button if you feel a symptom. You will hear a small click. Record Date, Time and  Symptom in the Patient Logbook.  When you are ready to remove the patch, follow  instructions on the last 2 pages of Patient  Logbook. Stick patch monitor onto the last page of Patient Logbook.  Place Patient Logbook in the blue and white box. Use locking tab on box and tape box closed  securely. The blue and white box has prepaid postage on it. Please place it in the mailbox as  soon as possible. Your physician should have your test results approximately 7 days after the  monitor has been mailed back to [facility].  Call [place] at [phone] if you have questions regarding  your ZIO XT patch monitor. Call them immediately if you see an orange light blinking on your  monitor.  If your monitor falls off in less than 4 days, contact our Monitor department at [phone].  If your monitor becomes loose or falls off after 4 days call [employer] at [phone] for  suggestions on securing your monitor    Follow-Up: At [facility], you and your health needs are our priority.  As part of our continuing mission to provide you with exceptional heart care, we have created designated Provider Care Teams.  These Care Teams include your primary Cardiologist (physician) and Advanced Practice Providers (APPs -  Physician Assistants and Nurse Practitioners) who all work together to provide you with the care you need, when you need it.  We recommend signing up for the patient portal called "MyChart".  Sign up information is provided on this After Visit Summary.  MyChart is used to connect with patients for Virtual Visits (Telemedicine).  Patients are able to view lab/test results, encounter notes, upcoming appointments, etc.  Non-urgent messages can be sent to your provider as well.   To learn more about what you can do with MyChart, go to [email or web address].    Your next appointment:   8 week(s)  The format for your next appointment:   In Person  Provider:   Coletta Memos, FNP or Sande Rives, PA-C    Then, Kirk Ruths, MD will plan to see you again in 4-5 month(s).

## 2021-09-21 NOTE — Progress Notes (Unsigned)
Enrolled patient for a 3 day ZIo XT monitor to be mailed to patients home  

## 2021-09-22 ENCOUNTER — Ambulatory Visit (HOSPITAL_COMMUNITY)
Admission: EM | Admit: 2021-09-22 | Discharge: 2021-09-22 | Disposition: A | Discharge status: Home / Self Care | Payer: Medicare Other | Attending: Emergency Medicine | Admitting: Emergency Medicine

## 2021-09-22 ENCOUNTER — Other Ambulatory Visit: Payer: Self-pay

## 2021-09-22 ENCOUNTER — Encounter (HOSPITAL_COMMUNITY): Payer: Self-pay

## 2021-09-22 DIAGNOSIS — N186 End stage renal disease: Secondary | ICD-10-CM | POA: Diagnosis not present

## 2021-09-22 DIAGNOSIS — Z992 Dependence on renal dialysis: Secondary | ICD-10-CM

## 2021-09-22 DIAGNOSIS — R21 Rash and other nonspecific skin eruption: Secondary | ICD-10-CM | POA: Diagnosis not present

## 2021-09-22 DIAGNOSIS — L03012 Cellulitis of left finger: Secondary | ICD-10-CM | POA: Diagnosis not present

## 2021-09-22 DIAGNOSIS — R7881 Bacteremia: Secondary | ICD-10-CM | POA: Diagnosis not present

## 2021-09-22 MED ORDER — DOXYCYCLINE HYCLATE 100 MG PO CAPS: 100.0000 mg | ORAL_CAPSULE | Freq: Two times a day (BID) | ORAL | 0 refills | Status: DC

## 2021-09-22 NOTE — Discharge Instructions (Addendum)
Please stop taking cephalexin at this time.  For the infected wound on your left finger please begin taking doxycycline 1 tablet twice daily for the next 10 days.  Please expect that your fingernail will fall off, this is okay because it will grow back.  Please follow-up with your primary care doctor in 1 week to check your progress.  The wound on your finger is not related to your carpal tunnel syndrome.  It is related to your significant kidney impairment and uncontrolled type 2 diabetes.  This is resulted in poor circulation which means that when you have an infection in your fingers in your toes, your body has a hard time fighting because it cannot get white blood cells to the wound without good circulation.  The rash on your back is also related to poor kidney function.  The toxins that are normally filtered from your blood by your kidneys continued to linger in your blood and leaked into your skin.  This sometimes can cause a reactive rash in some patients.  I recommend that you continue using the triamcinolone cream that was provided for you.  We wish you success with your procedure on Wednesday.  Thank you for visiting urgent care today.

## 2021-09-22 NOTE — ED Provider Notes (Signed)
[facility]    CSN: [id number] Arrival date & time: 09/22/21  1125    HISTORY   Chief Complaint  Patient presents with   Hand Pain   Rash   HPI Edward [name] is a 51 y.o. male. Pt presents with chronic sore on left finger that he believes is associated with carpal tunnel syndrome, states he was recently diagnosed with this and is being followed by orthopedics, states he has surgery scheduled for this coming Wednesday to treat this.  Patient states the sore has been present for a few weeks, states there is a hole the year, patient pulls his Band-Aid off and shows me that his nail bed is completely loose from the cuticle, there is also pus draining.  Pt is also complaining of recurrent rash on his back, states the rash is itchy.  Patient states he has been prescribed triamcinolone ointment for this which helps some.  Patient states the rashes been present for several months.  EMR reviewed, patient was seen in the emergency room on January 19, blood cultures were performed at that time.  Blood culture was positive for 2 versions of staph, patient was provided with a prescription for Keflex and advised no follow-up was needed.  Patient's dose of Keflex was not adjusted for his renal dysfunction.  Today, patient states he is unaware that he was supposed to be taking Keflex.  The history is provided by the patient.  Past Medical History:  Diagnosis Date   Allergy    Anemia    getting Venofer with dialysis   Anxiety    self reported, sometimes-situational anxiety   Arthritis    bilateral hands/LEFT hip   CHF (congestive heart failure) (HCC)    Chronic kidney disease    t th s dialysis- [place] ---ESRD   Complication of anesthesia    slow to go to sleep   Coronary artery disease    Depression    self reported, sometimes-situational depression   Diabetes mellitus    type II- on meds   Dyspnea    sometimes- fluid   GERD (gastroesophageal reflux disease)    on meds    Hypertension    no longer on medication since starting on Hemodialysis   Myocardial infarction ([place]) 2019   Sleep apnea    Patient Active Problem List   Diagnosis Date Noted   Acute cholecystitis 09/27/2020   Elevated LFTs 09/27/2020   TIA (transient ischemic attack) 09/27/2020   Nausea and vomiting    ESRD on hemodialysis ([place]) 06/22/2020   S/P coronary artery stent placement 06/22/2020   Encounter for removal of sutures 03/07/2020   Fluid overload, unspecified 12/29/2019   Hypertensive chronic kidney disease with stage 1 through stage 4 chronic kidney disease, or unspecified chronic kidney disease 11/30/2019   Pruritus, unspecified 11/30/2019   Secondary hyperparathyroidism of renal origin ([place]) 11/30/2019   Unspecified atrial fibrillation ([place]) 11/30/2019   Pain, unspecified 11/30/2019   Other specified arthritis, unspecified site 11/30/2019   Iron deficiency anemia, unspecified 11/30/2019   End stage renal disease ([place]) 11/30/2019   Encounter for screening for respiratory tuberculosis 11/30/2019   Diarrhea, unspecified 11/30/2019   Coagulation defect, unspecified ([place]) 11/30/2019   Atherosclerotic heart disease of native coronary artery without angina pectoris 11/30/2019   Acute renal failure (ARF) ([place]) 11/22/2019   Transaminitis 12/22/2018   Acute exacerbation of CHF (congestive heart failure) ([place]) 12/21/2018   Gait abnormality 09/11/2018   CAD S/P percutaneous coronary angioplasty 08/06/2018  Cardiomyopathy ([place]) 08/06/2018   PVD (peripheral vascular disease) ([place]) 08/06/2018   Shortness of breath    Chronic combined systolic and diastolic CHF (congestive heart failure) ([place])    History of NSTEMI 07/28/2018   Respiratory failure with hypoxia ([place]) 07/28/2018   Acute heart failure ([place]) 07/28/2018   Hypertensive emergency 07/28/2018   S/P BKA (below knee amputation) unilateral, right ([place]) 07/15/2018   Hypoglycemia    Anxiety about health    Labile blood glucose     Essential hypertension    History of transmetatarsal amputation of left foot (HCC)    Hypoalbuminemia due to protein-calorie malnutrition ([place])    Poorly controlled type 2 diabetes mellitus with peripheral neuropathy (HCC)    Acute blood loss anemia    Anemia of chronic disease    Leukocytosis    Tobacco abuse    Hyperkalemia 06/20/2018   Diabetic polyneuropathy associated with type 2 diabetes mellitus (HCC)    Severe protein-calorie malnutrition ([place])    AKI (acute kidney injury) ([place]) 06/17/2018   Normocytic anemia 06/17/2018   Viral pharyngitis 01/14/2017   Cigarette smoker 01/14/2017   Avascular necrosis of hip, left ([place]) 10/11/2016   Status post left hip replacement 10/11/2016   S/P transmetatarsal amputation of foot, left ([place]) 07/25/2016   Balance problem 07/25/2016   Fournier's gangrene 08/16/2011   Acute respiratory failure ([place]) 08/16/2011   Septic shock(785.52) 08/16/2011   Diabetes mellitus with complication, with long-term current use of insulin ([place]) 08/16/2011   Past Surgical History:  Procedure Laterality Date   A/V FISTULAGRAM Left 03/06/2020   Procedure: A/V FISTULAGRAM;  Surgeon: Waynetta Sandy, MD;  Location: [facility];  Service: Cardiovascular;  Laterality: Left;   ABDOMINAL AORTOGRAM W/LOWER EXTREMITY N/A 04/09/2021   Procedure: ABDOMINAL AORTOGRAM W/LOWER EXTREMITY;  Surgeon: Waynetta Sandy, MD;  Location: [facility];  Service: Cardiovascular;  Laterality: N/A;   AMPUTATION Left 05/10/2016   Procedure: Left Transmetatarsal Amputation;  Surgeon: [name], MD;  Location: [facility];  Service: Orthopedics;  Laterality: Left;   AMPUTATION Right 05/16/2018   Procedure: PARTIAL RAY AMPUTATION FIFTH TOE RIGHT FOOT;  Surgeon: [name], DPM;  Location: [facility];  Service: Podiatry;  Laterality: Right;   AMPUTATION Right 06/17/2018   Procedure: AMPUTATION 4th RAY Right Foot;  Surgeon: Trula Slade, DPM;  Location: [facility];   Service: Podiatry;  Laterality: Right;   AMPUTATION Right 06/19/2018   Procedure: RIGHT BELOW KNEE AMPUTATION;  Surgeon: [name], MD;  Location: [facility];  Service: Orthopedics;  Laterality: Right;   APPLICATION OF WOUND VAC Right 06/19/2018   Procedure: APPLICATION OF WOUND VAC;  Surgeon: [name], MD;  Location: [facility];  Service: Orthopedics;  Laterality: Right;   AV FISTULA PLACEMENT Left 11/25/2019   Procedure: LEFT ARM Brachiocephalic  ARTERIOVENOUS (AV) FISTULA CREATION;  Surgeon: Rosetta Posner, MD;  Location: [facility];  Service: Vascular;  Laterality: Left;   [place] Left 07/19/2020   Procedure: LEFT SECOND STAGE [place];  Surgeon: Waynetta Sandy, MD;  Location: [facility];  Service: Vascular;  Laterality: Left;   CARDIAC CATHETERIZATION  07/29/2018   CIRCUMCISION  09/02/2011   Procedure: CIRCUMCISION ADULT;  Surgeon: Molli Hazard, MD;  Location: [facility];  Service: Urology;  Laterality: N/A;   CORONARY STENT INTERVENTION N/A 07/29/2018   Procedure: CORONARY STENT INTERVENTION;  Surgeon: Leonie Man, MD;  Location: [facility];  Service: Cardiovascular;  Laterality: N/A;   I & D EXTREMITY  09/02/2011   Procedure: IRRIGATION AND DEBRIDEMENT EXTREMITY;  Surgeon: Theodoro Kos, DO;  Location: [facility];  Service: Plastics;  Laterality: N/A;   INCISION AND DRAINAGE OF WOUND  08/12/2011   Procedure: IRRIGATION AND DEBRIDEMENT WOUND;  Surgeon: Zenovia Jarred, MD;  Location: [facility];  Service: General;;  perineum   INSERTION OF DIALYSIS CATHETER N/A 11/25/2019   Procedure: INSERTION OF [place] Internal Jugular DIALYSIS CATHETER;  Surgeon: Rosetta Posner, MD;  Location: [facility];  Service: Vascular;  Laterality: N/A;   [place]  08/12/2011   Procedure: IRRIGATION AND DEBRIDEMENT ABSCESS;  Surgeon: Molli Hazard, MD;  Location: [facility];  Service: Urology;  Laterality: N/A;   irrigation and debridment perineum     IRRIGATION AND DEBRIDEMENT ABSCESS  08/14/2011   Procedure: MINOR INCISION AND DRAINAGE OF ABSCESS;  Surgeon: Molli Hazard, MD;  Location: [facility];  Service: Urology;  Laterality: N/A;  Perineal Wound Debridement;Placement Bilateral Testicular Thigh Pouches   LEFT HEART CATH AND CORONARY ANGIOGRAPHY N/A 07/29/2018   Procedure: LEFT HEART CATH AND CORONARY ANGIOGRAPHY;  Surgeon: Leonie Man, MD;  Location: [facility];  Service: Cardiovascular;  Laterality: N/A;   PERIPHERAL VASCULAR ATHERECTOMY Left 04/09/2021   Procedure: PERIPHERAL VASCULAR ATHERECTOMY;  Surgeon: Waynetta Sandy, MD;  Location: [facility];  Service: Cardiovascular;  Laterality: Left;  TP trunk and peroneal arteries   PERIPHERAL VASCULAR BALLOON ANGIOPLASTY Left 04/09/2021   Procedure: PERIPHERAL VASCULAR BALLOON ANGIOPLASTY;  Surgeon: Waynetta Sandy, MD;  Location: [facility];  Service: Cardiovascular;  Laterality: Left;  Popliteal   REVISON OF ARTERIOVENOUS FISTULA Left 05/24/2020   Procedure: LEFT ARM ARTERIOVENOUS FISTULA  CONVERSION TO BASILIC VEIN FISTULA;  Surgeon: Waynetta Sandy, MD;  Location: [facility];  Service: Vascular;  Laterality: Left;   TOTAL HIP ARTHROPLASTY Left 10/11/2016   Procedure: LEFT TOTAL HIP ARTHROPLASTY ANTERIOR APPROACH;  Surgeon: Mcarthur Rossetti, MD;  [facility];  Service: Orthopedics;  Laterality: Left;    Home Medications    Prior to Admission medications   Medication Sig Start Date End Date Taking? Authorizing Provider  doxycycline (VIBRAMYCIN) 100 MG capsule Take 1 capsule (100 mg total) by mouth 2 (two) times daily. 09/22/21  Yes Lynden Oxford Scales, PA-C  acetaminophen (TYLENOL) 500 MG tablet Take 500-1,000 mg by mouth every 6 (six) hours as needed for moderate pain or headache.    [provider]  allopurinol (ZYLOPRIM) 100 MG tablet Take 100 mg by mouth daily. 08/30/21    [provider]  aspirin EC 81 MG tablet Take 1 tablet (81 mg total) by mouth daily. Swallow whole. 03/14/20   Lelon Perla, MD  atorvastatin (LIPITOR) 80 MG tablet Take 1 tablet (80 mg total) by mouth daily. 09/21/21 12/20/21  Lelon Perla, MD  bismuth subsalicylate (PEPTO BISMOL) 262 MG/15ML suspension Take 30 mLs by mouth every 6 (six) hours as needed for indigestion.    [provider]  blood glucose meter kit and supplies Dispense based on patient and insurance preference. Use up to four times daily as directed. (FOR ICD-9 250.00, 250.01). 07/01/18   Angiulli, Lavon Paganini, PA-C  cephALEXin (KEFLEX) 500 MG capsule Take 500 mg by mouth 4 (four) times daily. 09/07/21   [provider]  clopidogrel (PLAVIX) 75 MG tablet Take 1 tablet (75 mg total) by mouth daily. 04/09/21 04/09/22  Waynetta Sandy,  MD  gabapentin (NEURONTIN) 100 MG capsule Take 100 mg by mouth 3 (three) times daily. 08/09/21   [provider]  ibuprofen (ADVIL) 200 MG tablet Take 600 mg by mouth every 6 (six) hours as needed for headache or moderate pain.    [provider]  insulin aspart protamine - aspart (NOVOLOG MIX 70/30 FLEXPEN) (70-30) 100 UNIT/ML FlexPen Inject 0.3 mLs (30 Units total) into the skin 2 (two) times daily with a meal. Patient taking differently: Inject 30 Units into the skin with breakfast, with lunch, and with evening meal. 09/29/20   Lavina Hamman, MD  Insulin Pen Needle 31G X 5 MM MISC Use daily to inject insulin as instructed 07/01/18   Angiulli, Lavon Paganini, PA-C  lidocaine-prilocaine (EMLA) cream Apply 1 application topically See admin instructions. Apply a small amount to access site (AVF) 1-2 hours before dialysis. Cover with occlusive dressing (saran wrap). 03/22/21   [provider]  linaclotide Rolan Lipa) 145 MCG CAPS capsule Take 1 capsule (145 mcg total) by mouth daily before breakfast. Patient not taking: Reported on 09/21/2021 12/13/20    Yetta Flock, MD  linaclotide [facility]) 72 MCG capsule Take 1 capsule (72 mcg total) by mouth daily before breakfast. 12/13/20   Armbruster, Carlota Raspberry, MD  pantoprazole (PROTONIX) 40 MG tablet Take 1 tablet (40 mg total) by mouth in the morning and at bedtime. 08/29/21   Willia Craze, NP  polyethylene glycol (MIRALAX) 17 g packet Take 17 g by mouth daily. Patient not taking: Reported on 09/21/2021 12/13/20   Yetta Flock, MD  predniSONE (DELTASONE) 20 MG tablet Take 1 tablet by mouth in the morning and at bedtime. Patient not taking: Reported on 09/21/2021 08/14/21   [provider]  triamcinolone ointment (KENALOG) 0.1 % Apply a small amount to affected area twice a day 09/04/21   [provider]  VELPHORO 500 MG chewable tablet Chew 500 mg by mouth 3 (three) times daily with meals.  06/14/20   [provider]    Family History Family History  Problem Relation Age of Onset   Diabetes Other    Pancreatic cancer Mother 24   Colon cancer Neg Hx    Colon polyps Neg Hx    Esophageal cancer Neg Hx    Rectal cancer Neg Hx    Stomach cancer Neg Hx    Social History Social History   Tobacco Use   Smoking status: Former    Packs/day: 0.25    Years: 24.00    Pack years: 6.00    Types: Cigarettes    Quit date: 11/19/2019    Years since quitting: 1.8   Smokeless tobacco: Never  Vaping Use   Vaping Use: Never used  Substance Use Topics   Alcohol use: Not Currently    Alcohol/week: 0.0 standard drinks    Comment: quit 2018   Drug use: No   Allergies   Crestor [rosuvastatin]  Review of Systems Review of Systems Pertinent findings noted in history of present illness.   Physical Exam Triage Vital Signs ED Triage Vitals  Enc Vitals Group     BP 06/15/21 0827 (!) 147/82     Pulse Rate 06/15/21 0827 72     Resp 06/15/21 0827 18     Temp 06/15/21 0827 98.3 F (36.8 C)     Temp Source 06/15/21 0827 Oral     SpO2 06/15/21 0827 98 %     Weight  --      Height --  Head Circumference --      Peak Flow --      Pain Score 06/15/21 0826 5     Pain Loc --      Pain Edu? --      Excl. in [place]? --   No data found.  Updated Vital Signs BP 138/81 (BP Location: Left Arm)    Pulse 77    Temp (!) 97.3 F (36.3 C) (Oral)    Resp 17    SpO2 98%   Physical Exam Vitals and nursing note reviewed.  Constitutional:      General: He is not in acute distress.    Appearance: Normal appearance. He is not ill-appearing.  HENT:     Head: Normocephalic and atraumatic.  Eyes:     General: Lids are normal.        Right eye: No discharge.        Left eye: No discharge.     Extraocular Movements: Extraocular movements intact.     Conjunctiva/sclera: Conjunctivae normal.     Right eye: Right conjunctiva is not injected.     Left eye: Left conjunctiva is not injected.  Neck:     Trachea: Trachea and phonation normal.  Cardiovascular:     Rate and Rhythm: Normal rate and regular rhythm.     Pulses: Normal pulses.     Heart sounds: Normal heart sounds. No murmur heard.   No friction rub. No gallop.  Pulmonary:     Effort: Pulmonary effort is normal. No accessory muscle usage, prolonged expiration or respiratory distress.     Breath sounds: Normal breath sounds. No stridor, decreased air movement or transmitted upper airway sounds. No decreased breath sounds, wheezing, rhonchi or rales.  Chest:     Chest wall: No tenderness.  Musculoskeletal:        General: Normal range of motion.     Cervical back: Normal range of motion and neck supple. Normal range of motion.  Lymphadenopathy:     Cervical: No cervical adenopathy.  Skin:    General: Skin is warm and dry.     Coloration: Skin is ashen and pale.     Findings: Abscess (Left middle finger nail) and erythema present. No rash.     Comments: Skin tips of fingers is diffusely callused and dry, there is thickening and malformation of all fingernails.  Neurological:     General: No focal  deficit present.     Mental Status: He is alert and oriented to person, place, and time.  Psychiatric:        Mood and Affect: Mood normal.        Behavior: Behavior normal.    Visual Acuity Right Eye Distance:   Left Eye Distance:   Bilateral Distance:    Right Eye Near:   Left Eye Near:    Bilateral Near:     UC Couse / Diagnostics / Procedures:    EKG  Radiology No results found.  Procedures Procedures (including critical care time)  UC Diagnoses / Final Clinical Impressions(s)   I have reviewed the triage vital signs and the nursing notes.  Pertinent labs & imaging results that were available during my care of the patient were reviewed by me and considered in my medical decision making (see chart for details).    Final diagnoses:  Rash  Blood bacterial culture positive  ESRD (end stage renal disease) ([place])  Cellulitis of left finger  ESRD on dialysis [facility])   Patient is advised  that his finger nail will grow back and that this is not related to his carpal tunnel syndrome.  Patient was advised to stop Keflex at this time and to begin doxycycline for better staph coverage.  Patient encouraged to follow-up with his primary care for these issues.  ED Prescriptions     Medication Sig Dispense Auth. Provider   doxycycline (VIBRAMYCIN) 100 MG capsule Take 1 capsule (100 mg total) by mouth 2 (two) times daily. 20 capsule Lynden Oxford Scales, PA-C      PDMP not reviewed this encounter.  Pending results:  Labs Reviewed - No data to display  Medications Ordered in UC: Medications - No data to display  Disposition Upon Discharge:  Condition: stable for discharge home Home: take medications as prescribed; routine discharge instructions as discussed; follow up as advised.  Patient presented with an acute illness with associated systemic symptoms and significant discomfort requiring urgent management. In my opinion, this is a condition that a prudent lay person  (someone who possesses an average knowledge of health and medicine) may potentially expect to result in complications if not addressed urgently such as respiratory distress, impairment of bodily function or dysfunction of bodily organs.   Routine symptom specific, illness specific and/or disease specific instructions were discussed with the patient and/or caregiver at length.   As such, the patient has been evaluated and assessed, work-up was performed and treatment was provided in alignment with urgent care protocols and evidence based medicine.  Patient/parent/caregiver has been advised that the patient may require follow up for further testing and treatment if the symptoms continue in spite of treatment, as clinically indicated and appropriate.  If the patient was tested for COVID-19, Influenza and/or RSV, then the patient/parent/guardian was advised to isolate at home pending the results of his/her diagnostic coronavirus test and potentially longer if theyre positive. I have also advised pt that if his/her COVID-19 test returns positive, it's recommended to self-isolate for at least 10 days after symptoms first appeared AND until fever-free for 24 hours without fever reducer AND other symptoms have improved or resolved. Discussed self-isolation recommendations as well as instructions for household member/close contacts as per the [facility] and [place] [facility], and also gave patient the [facility] packet with this information.  Patient/parent/caregiver has been advised to return to the [facility] or PCP in 3-5 days if no better; to PCP or the Emergency Department if new signs and symptoms develop, or if the current signs or symptoms continue to change or worsen for further workup, evaluation and treatment as clinically indicated and appropriate  The patient will follow up with their current PCP if and as advised. If the patient does not currently have a PCP we will assist them in obtaining one.   The patient may need  specialty follow up if the symptoms continue, in spite of conservative treatment and management, for further workup, evaluation, consultation and treatment as clinically indicated and appropriate.   Patient/parent/caregiver verbalized understanding and agreement of plan as discussed.  All questions were addressed during visit.  Please see discharge instructions below for further details of plan.  Discharge Instructions:   Discharge Instructions      Please stop taking cephalexin at this time.  For the infected wound on your left finger please begin taking doxycycline 1 tablet twice daily for the next 10 days.  Please expect that your fingernail will fall off, this is okay because it will grow back.  Please follow-up with your primary care doctor in 1 week  to check your progress.  The wound on your finger is not related to your carpal tunnel syndrome.  It is related to your significant kidney impairment and uncontrolled type 2 diabetes.  This is resulted in poor circulation which means that when you have an infection in your fingers in your toes, your body has a hard time fighting because it cannot get white blood cells to the wound without good circulation.  The rash on your back is also related to poor kidney function.  The toxins that are normally filtered from your blood by your kidneys continued to linger in your blood and leaked into your skin.  This sometimes can cause a reactive rash in some patients.  I recommend that you continue using the triamcinolone cream that was provided for you.  We wish you success with your procedure on Wednesday.  Thank you for visiting urgent care today.    This office note has been dictated using Museum/gallery curator.  Unfortunately, and despite my best efforts, this method of dictation can sometimes lead to occasional typographical or grammatical errors.  I apologize in advance if this occurs.     Lynden Oxford Scales, PA-C 09/22/21  1702

## 2021-09-22 NOTE — ED Triage Notes (Signed)
Pt presents with chronic sore on left fingers that is believed to be associated with carpal tunnel he was recently diagnosed with; pt is following with orthopedic.  Pt is also complaining of recurrent on back.

## 2021-09-24 ENCOUNTER — Encounter (HOSPITAL_BASED_OUTPATIENT_CLINIC_OR_DEPARTMENT_OTHER): Payer: Medicare Other | Attending: Internal Medicine | Admitting: Internal Medicine

## 2021-09-24 ENCOUNTER — Other Ambulatory Visit: Payer: Self-pay

## 2021-09-24 DIAGNOSIS — Z87891 Personal history of nicotine dependence: Secondary | ICD-10-CM | POA: Insufficient documentation

## 2021-09-24 DIAGNOSIS — X58XXXA Exposure to other specified factors, initial encounter: Secondary | ICD-10-CM | POA: Insufficient documentation

## 2021-09-24 DIAGNOSIS — E1122 Type 2 diabetes mellitus with diabetic chronic kidney disease: Secondary | ICD-10-CM | POA: Diagnosis not present

## 2021-09-24 DIAGNOSIS — I87332 Chronic venous hypertension (idiopathic) with ulcer and inflammation of left lower extremity: Secondary | ICD-10-CM | POA: Diagnosis not present

## 2021-09-24 DIAGNOSIS — I739 Peripheral vascular disease, unspecified: Secondary | ICD-10-CM | POA: Diagnosis not present

## 2021-09-24 DIAGNOSIS — Z992 Dependence on renal dialysis: Secondary | ICD-10-CM | POA: Insufficient documentation

## 2021-09-24 DIAGNOSIS — Z89432 Acquired absence of left foot: Secondary | ICD-10-CM | POA: Insufficient documentation

## 2021-09-24 DIAGNOSIS — Z89511 Acquired absence of right leg below knee: Secondary | ICD-10-CM | POA: Insufficient documentation

## 2021-09-24 DIAGNOSIS — N186 End stage renal disease: Secondary | ICD-10-CM | POA: Insufficient documentation

## 2021-09-24 DIAGNOSIS — S61301A Unspecified open wound of left index finger with damage to nail, initial encounter: Secondary | ICD-10-CM | POA: Insufficient documentation

## 2021-09-24 DIAGNOSIS — L97822 Non-pressure chronic ulcer of other part of left lower leg with fat layer exposed: Secondary | ICD-10-CM | POA: Diagnosis not present

## 2021-09-24 DIAGNOSIS — E11621 Type 2 diabetes mellitus with foot ulcer: Secondary | ICD-10-CM | POA: Insufficient documentation

## 2021-09-24 NOTE — Progress Notes (Signed)
Edward [name] ([id number]) Visit Report for 09/24/2021 Chief Complaint Document Details Patient Name: Date of Service: Edward [name] MES L. 09/24/2021 2:30 PM Medical Record Number: [id number] Patient Account Number: [id number] Date of Birth/Sex: Treating RN: [birth year] (51 y.o. Male) Lorrin Jackson Primary Care Provider: [name] Other Clinician: Referring Provider: Treating Provider/Extender: Christie Nottingham Weeks in Treatment: 0 Information Obtained from: Patient Chief Complaint 09/24/2021; patient presents with fourth right finger wound, left medial ankle and left medial lower leg wounds Electronic Signature(s) Signed: 09/24/2021 3:53:57 PM By: Kalman Shan DO Entered By: Kalman Shan on 09/24/2021 15:46:14 -------------------------------------------------------------------------------- Debridement Details Patient Name: Date of Service: Edward [name], Edward [name] MES L. 09/24/2021 2:30 PM Medical Record Number: [id number] Patient Account Number: [id number] Date of Birth/Sex: Treating RN: 1971/01/29 (51 y.o. Male) Lorrin Jackson Primary Care Provider: [name] Other Clinician: Referring Provider: Treating Provider/Extender: Christie Nottingham Weeks in Treatment: 0 Debridement Performed for Assessment: Wound #1 Left,Medial Lower Leg Performed By: Physician Kalman Shan, DO Debridement Type: Chemical/Enzymatic/Mechanical Agent Used: gauze and wound cleanser Severity of Tissue Pre Debridement: Fat layer exposed Level of Consciousness (Pre-procedure): Awake and Alert Pre-procedure Verification/Time Out Yes - 15:16 Taken: Start Time: 15:17 Instrument: Other : Gauze Bleeding: None End Time: 15:19 Response to Treatment: Procedure was tolerated well Level of Consciousness (Post- Awake and Alert procedure): Post Debridement Measurements of Total Wound Length: (cm) 4.2 Width: (cm) 2.2 Depth: (cm) 0.2 Volume: (cm) 1.451 Character of Wound/Ulcer  Post Debridement: Stable Severity of Tissue Post Debridement: Fat layer exposed Post Procedure Diagnosis Same as Pre-procedure Electronic Signature(s) Signed: 09/24/2021 3:53:57 PM By: Kalman Shan DO Signed: 09/24/2021 4:43:35 PM By: Lorrin Jackson Entered By: Lorrin Jackson on 09/24/2021 15:24:47 -------------------------------------------------------------------------------- Debridement Details Patient Name: Date of Service: Edward [name], Edward [name] MES L. 09/24/2021 2:30 PM Medical Record Number: [id number] Patient Account Number: [id number] Date of Birth/Sex: Treating RN: 09-25-1970 (51 y.o. Male) Lorrin Jackson Primary Care Provider: [name] Other Clinician: Referring Provider: Treating Provider/Extender: Christie Nottingham Weeks in Treatment: 0 Debridement Performed for Assessment: Wound #2 Left,Medial Ankle Performed By: Physician Kalman Shan, DO Debridement Type: Chemical/Enzymatic/Mechanical Agent Used: gauze and wound cleanser Severity of Tissue Pre Debridement: Fat layer exposed Level of Consciousness (Pre-procedure): Awake and Alert Pre-procedure Verification/Time Out Yes - 15:16 Taken: Start Time: 15:19 Instrument: Other : Gauze Bleeding: None End Time: 15:22 Response to Treatment: Procedure was tolerated well Level of Consciousness (Post- Awake and Alert procedure): Post Debridement Measurements of Total Wound Length: (cm) 1.4 Width: (cm) 1.1 Depth: (cm) 0.1 Volume: (cm) 0.121 Character of Wound/Ulcer Post Debridement: Stable Severity of Tissue Post Debridement: Fat layer exposed Post Procedure Diagnosis Same as Pre-procedure Electronic Signature(s) Signed: 09/24/2021 3:53:57 PM By: Kalman Shan DO Signed: 09/24/2021 4:43:35 PM By: Lorrin Jackson Entered By: Lorrin Jackson on 09/24/2021 15:25:31 -------------------------------------------------------------------------------- Debridement Details Patient Name: Date of Service: Edward [name], Edward [name]  MES L. 09/24/2021 2:30 PM Medical Record Number: [id number] Patient Account Number: [id number] Date of Birth/Sex: Treating RN: 06-10-1971 (51 y.o. Male) Lorrin Jackson Primary Care Provider: [name] Other Clinician: Referring Provider: Treating Provider/Extender: Christie Nottingham Weeks in Treatment: 0 Debridement Performed for Assessment: Wound #3 Left Hand - 4th Digit Performed By: Physician Kalman Shan, DO Debridement Type: Chemical/Enzymatic/Mechanical Agent Used: gauze and wound cleanser Level of Consciousness (Pre-procedure): Awake and Alert Pre-procedure Verification/Time Out Yes - 15:16 Yes - 15:16 Taken: Start Time: 15:22 Instrument: Other : Gauze Bleeding: None End Time: 15:25 Response  to Treatment: Procedure was tolerated well Level of Consciousness (Post- Awake and Alert procedure): Post Debridement Measurements of Total Wound Length: (cm) 0.7 Width: (cm) 2.1 Depth: (cm) 0.2 Volume: (cm) 0.231 Character of Wound/Ulcer Post Debridement: Stable Post Procedure Diagnosis Same as Pre-procedure Electronic Signature(s) Signed: 09/24/2021 3:53:57 PM By: Kalman Shan DO Signed: 09/24/2021 4:43:35 PM By: Lorrin Jackson Entered By: Lorrin Jackson on 09/24/2021 15:31:49 -------------------------------------------------------------------------------- HPI Details Patient Name: Date of Service: Edward [name], Edward [name] MES L. 09/24/2021 2:30 PM Medical Record Number: [id number] Patient Account Number: [id number] Date of Birth/Sex: Treating RN: [birth year] (51 y.o. 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 He developed cellulitis to the left fourth finger and was treated with doxycycline at urgent care. The nail is coming off. He reports taking the antibiotics currently. He has been keeping the area covered. He also presents with 2 left lower extremity wounds. He reports that the more proximal wound has been present for several months and the more distal wound has developed more recently over the past several weeks. He had an abdominal aortogram to the left lower extremity with laser atherectomy to the left popliteal, left tibioperoneal trunk and left peroneal arteries, drug-coated balloon angioplasty of the left popliteal artery and plain balloon angioplasty of the left tibioperoneal trunk and left peroneal artery. He has not followed up with vein and vascular since his procedure on 04/09/2021. He currently denies signs of infection. He currently denies pain. Electronic Signature(s) Signed: 09/24/2021 3:53:57 PM By: Kalman Shan DO Entered By: Kalman Shan on 09/24/2021 15:48:55 -------------------------------------------------------------------------------- Physical Exam Details Patient Name: Date of Service: Edward [name] MES L. 09/24/2021 2:30 PM Medical Record Number: [id number] Patient Account Number: [id number] Date of Birth/Sex: Treating RN: April 28, 1971 (51 y.o. Male) Lorrin Jackson Primary Care Provider: [name] Other Clinician: Referring Provider: Treating Provider/Extender: Christie Nottingham Weeks in Treatment: 0 Constitutional respirations regular, non-labored and within target range for patient.Marland Kitchen Psychiatric pleasant and cooperative. Notes Left lower extremity: 2 open wounds with granulation tissue and nonviable tissue present. Could not palpate pedal pulses. Fourth left finger: Nail was barely on and easily removed in office. Viable nonviable tissue underneath. No purulent drainage  noted. Electronic Signature(s) Signed: 09/24/2021 3:53:57 PM By: Kalman Shan DO Entered By: Kalman Shan on 09/24/2021 15:50:06 -------------------------------------------------------------------------------- Physician Orders Details Patient Name: Date of Service: Edward [name], Edward [name] MES L. 09/24/2021 2:30 PM Medical Record Number: [id number] Patient Account Number: [id number] Date of Birth/Sex: Treating RN: 20-Nov-1970 (51 y.o. Male) Lorrin Jackson Primary Care Provider: [name] Other Clinician: Referring Provider: Treating Provider/Extender: Christie Nottingham Weeks in Treatment: 0 Verbal / Phone Orders: No Diagnosis Coding Follow-up Appointments ppointment in 1 week. - with Dr. Heber [place] Return A Other: - **Call and make follow up appt with Vascular Appt** Bathing/ Shower/ Hygiene May shower and wash wound with soap and water. - use antibacterial soap when changing dressing. Edema Control - Lymphedema / SCD / Other Elevate legs to the level of the heart or above for 30 minutes daily and/or when sitting, a frequency of: - throughout the day Avoid standing for long periods of time. Moisturize  legs daily. Additional Orders / Instructions Follow Nutritious Diet - Monitor/Control Blood Sugars Wound Treatment Wound #1 - Lower Leg Wound Laterality: Left, Medial Cleanser: Soap and Water 1 x Per Day/30 Days Discharge Instructions: May shower and wash wound with dial antibacterial soap and water prior to dressing change. Prim Dressing: Hydrofera Blue Ready Foam, 4x5 in (DME) (Generic) 1 x Per Day/30 Days ary Discharge Instructions: Apply to wound bed as instructed Prim Dressing: Santyl Ointment 1 x Per Day/30 Days ary Discharge Instructions: In Office Only Secondary Dressing: ABD Pad, 5x9 (DME) (Generic) 1 x Per Day/30 Days Discharge Instructions: Apply over primary dressing as directed. Secured With: [employer], 4.5x3.1 (in/yd) (DME) (Generic) 1 x Per Day/30  Days Discharge Instructions: Secure with Kerlix as directed. Secured With: 1M Medipore H Soft Cloth Surgical T ape, 4 x 10 (in/yd) (DME) (Generic) 1 x Per Day/30 Days Discharge Instructions: Secure with tape as directed. Wound #2 - Ankle Wound Laterality: Left, Medial Cleanser: Soap and Water 1 x Per Day/30 Days Discharge Instructions: May shower and wash wound with dial antibacterial soap and water prior to dressing change. Prim Dressing: Hydrofera Blue Ready Foam, 4x5 in (DME) (Generic) 1 x Per Day/30 Days ary Discharge Instructions: Apply to wound bed as instructed Prim Dressing: Santyl Ointment 1 x Per Day/30 Days ary Discharge Instructions: In Office Only Secondary Dressing: ABD Pad, 5x9 (DME) (Generic) 1 x Per Day/30 Days Discharge Instructions: Apply over primary dressing as directed. Secured With: [employer], 4.5x3.1 (in/yd) (DME) (Generic) 1 x Per Day/30 Days Discharge Instructions: Secure with Kerlix as directed. Secured With: 1M Medipore H Soft Cloth Surgical T ape, 4 x 10 (in/yd) (DME) (Generic) 1 x Per Day/30 Days Discharge Instructions: Secure with tape as directed. Wound #3 - Hand - 4th Digit Wound Laterality: Left Cleanser: Soap and Water 1 x Per Day/30 Days Discharge Instructions: May shower and wash wound with dial antibacterial soap and water prior to dressing change. Topical: Mupirocin Ointment 1 x Per Day/30 Days Discharge Instructions: Apply Mupirocin (Bactroban) as instructed Secondary Dressing: Woven Gauze Sponge, Non-Sterile 4x4 in (DME) (Generic) 1 x Per Day/30 Days Discharge Instructions: Apply over primary dressing as directed. Secured With: Child psychotherapist, Sterile 2x75 (in/in) (DME) (Generic) 1 x Per Day/30 Days Discharge Instructions: Secure with stretch gauze as directed. Secured With: 1M Medipore H Soft Cloth Surgical T ape, 4 x 10 (in/yd) (DME) (Generic) 1 x Per Day/30 Days Discharge Instructions: Secure with tape as  directed. Electronic Signature(s) Signed: 09/24/2021 3:53:57 PM By: Kalman Shan DO Entered By: Kalman Shan on 09/24/2021 15:50:25 -------------------------------------------------------------------------------- Problem List Details Patient Name: Date of Service: Edward [name], Edward [name] MES L. 09/24/2021 2:30 PM Medical Record Number: [id number] Patient Account Number: [id number] Date of Birth/Sex: Treating RN: 1971-02-01 (51 y.o. Male) Lorrin Jackson Primary Care Provider: [name] Other Clinician: Referring Provider: Treating Provider/Extender: Christie Nottingham Weeks in Treatment: 0 Active Problems ICD-10 Encounter Code Description Active Date MDM Diagnosis S61.301A Unspecified open wound of left index finger with damage to nail, initial 09/24/2021 No Yes encounter L97.822 Non-pressure chronic ulcer of other part of left lower leg with fat layer exposed2/01/2022 No Yes I87.332 Chronic venous hypertension (idiopathic) with ulcer and inflammation of left 09/24/2021 No Yes lower extremity I73.9 Peripheral vascular disease, unspecified 09/24/2021 No Yes Inactive Problems Resolved Problems Electronic Signature(s) Signed: 09/24/2021 3:53:57 PM By: Kalman Shan DO Entered By: Kalman Shan on 09/24/2021 15:45:28 -------------------------------------------------------------------------------- Progress Note Details Patient Name: Date of Service:  Edward [name], Edward [name] MES L. 09/24/2021 2:30 PM Medical Record Number: [id number] Patient Account Number: [id number] Date of Birth/Sex: Treating RN: 22-Jul-1971 (51 y.o. Male) Lorrin Jackson Primary Care Provider: [name] Other Clinician: Referring Provider: Treating Provider/Extender: Christie Nottingham Weeks in Treatment: 0 Subjective Chief Complaint Information obtained from Patient 09/24/2021; patient presents with fourth right finger wound, left medial ankle and left medial lower leg wounds History of Present  Illness (HPI) Admission 09/24/2021 Mr. 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 He developed cellulitis to the left fourth finger and was treated with doxycycline at urgent care. The nail is coming off. He reports taking the antibiotics currently. He has been keeping the area covered. He also presents with 2 left lower extremity wounds. He reports that the more proximal wound has been present for several months and the more distal wound has developed more recently over the past several weeks. He had an abdominal aortogram to the left lower extremity with laser atherectomy to the left popliteal, left tibioperoneal trunk and left peroneal arteries, drug-coated balloon angioplasty of the left popliteal artery and plain balloon angioplasty of the left tibioperoneal trunk and left peroneal artery. He has not followed up with vein and vascular since his procedure on 04/09/2021. He currently denies signs of infection. He currently denies pain. Patient History Information obtained from Patient. Allergies Crestor (Reaction: Rash) Family History Cancer - Mother, Diabetes - Siblings, Heart Disease - Mother,Father, Kidney Disease - Siblings,Father, No family history of Hereditary Spherocytosis, Hypertension, Lung Disease, Seizures, Stroke, Thyroid Problems, Tuberculosis. Social History Former smoker - Quit 2 years ago, Marital Status - Widowed, Alcohol Use - Rarely, Drug Use - No History, Caffeine Use - Rarely. Medical History Eyes Patient has history of Cataracts - Surgery Cardiovascular Patient has history of Arrhythmia, Congestive Heart Failure, Coronary Artery Disease, Myocardial Infarction, Peripheral Venous Disease Endocrine Patient has history of Type II Diabetes Genitourinary Patient has history of End Stage  Renal Disease - Dialysis Musculoskeletal Patient has history of Osteoarthritis, Osteomyelitis Neurologic Patient has history of Neuropathy Patient is treated with Insulin. Blood sugar is tested. Review of Systems (ROS) Eyes Complains or has symptoms of Glasses / Contacts. Ear/Nose/Mouth/Throat Denies complaints or symptoms of Chronic sinus problems or rhinitis. Respiratory Denies complaints or symptoms of Chronic or frequent coughs, Shortness of Breath. Gastrointestinal Denies complaints or symptoms of Frequent diarrhea, Nausea, Vomiting. Integumentary (Skin) Complains or has symptoms of Wounds - L Leg. Musculoskeletal Denies complaints or symptoms of Muscle Pain, Muscle Weakness. Psychiatric Denies complaints or symptoms of Claustrophobia, Suicidal. Objective Constitutional respirations regular, non-labored and within target range for patient.. Vitals Time Taken: 2:32 PM, Height: 73 in, Source: Stated, Weight: 233 lbs, Source: Stated, BMI: 30.7, Temperature: 98.2 F, Pulse: 92 bpm, Respiratory Rate: 18 breaths/min, Blood Pressure: 142/78 mmHg, Capillary Blood Glucose: 106 mg/dl. Psychiatric pleasant and cooperative. General Notes: Left lower extremity: 2 open wounds with granulation tissue and nonviable tissue present. Could not palpate pedal pulses. Fourth left finger: Nail was barely on and easily removed in office. Viable nonviable tissue underneath. No purulent drainage noted. Integumentary (Hair, Skin) Wound #1 status is Open. Original cause of wound was Gradually Appeared. The date acquired was: 07/23/2021. The wound is located on the Left,Medial Lower Leg. The wound measures 4.2cm length x 2.2cm width x 0.2cm depth; 7.257cm^2 area  and 1.451cm^3 volume. There is Fat Layer (Subcutaneous Tissue) exposed. There is no tunneling or undermining noted. There is a medium amount of serosanguineous drainage noted. The wound margin is distinct with the outline attached to the wound  base. There is large (67-100%) red, pink granulation within the wound bed. There is a small (1-33%) amount of necrotic tissue within the wound bed including Adherent Slough. Wound #2 status is Open. Original cause of wound was Gradually Appeared. The date acquired was: 07/30/2021. The wound is located on the Left,Medial Ankle. The wound measures 1.4cm length x 1.1cm width x 0.1cm depth; 1.21cm^2 area and 0.121cm^3 volume. There is Fat Layer (Subcutaneous Tissue) exposed. There is no tunneling or undermining noted. There is a medium amount of serosanguineous drainage noted. The wound margin is distinct with the outline attached to the wound base. There is large (67-100%) red, pink granulation within the wound bed. There is a small (1-33%) amount of necrotic tissue within the wound bed including Adherent Slough. Wound #3 status is Open. Original cause of wound was Trauma. The date acquired was: 09/22/2021. The wound is located on the Left Hand - 4th Digit. The wound measures 0.7cm length x 2.1cm width x 0.2cm depth; 1.155cm^2 area and 0.231cm^3 volume. There is Fat Layer (Subcutaneous Tissue) exposed. There is no tunneling or undermining noted. There is a medium amount of serosanguineous drainage noted. The wound margin is distinct with the outline attached to the wound base. There is large (67-100%) red granulation within the wound bed. There is a small (1-33%) amount of necrotic tissue within the wound bed including Adherent Slough. Assessment Active Problems ICD-10 Unspecified open wound of left index finger with damage to nail, initial encounter Non-pressure chronic ulcer of other part of left lower leg with fat layer exposed Chronic venous hypertension (idiopathic) with ulcer and inflammation of left lower extremity Peripheral vascular disease, unspecified Patient presents with a several day history of fourth left finger pain. He was treated for cellulitis in urgent care with doxycycline. I  recommended continuing this. The nail was easily removed. I recommended using mupirocin ointment to the wound bed as well and keeping the area covered. Patient has a history of peripheral arterial disease with recent arteriogram on 04/09/2021 and had angioplasty of several major arteries. I recommended he follow-up with vein and vascular to assure that he continues to have adequate blood flow. ABIs in office on the left side were 0.5 He does look like he has a mixed picture of venous disease. For now I recommended using Hydrofera Blue to the wound beds. Follow-up in 1 week. 52 Minutes was spent on the encounter including face-to-face, EMR review and coordination of care Procedures Wound #1 Pre-procedure diagnosis of Wound #1 is a Venous Leg Ulcer located on the Left,Medial Lower Leg .Severity of Tissue Pre Debridement is: Fat layer exposed. There was a Chemical/Enzymatic/Mechanical debridement performed by Kalman Shan, DO. With the following instrument(s): Gauze. Other agent used was gauze and wound cleanser. A time out was conducted at 15:16, prior to the start of the procedure. There was no bleeding. The procedure was tolerated well. Post Debridement Measurements: 4.2cm length x 2.2cm width x 0.2cm depth; 1.451cm^3 volume. Character of Wound/Ulcer Post Debridement is stable. Severity of Tissue Post Debridement is: Fat layer exposed. Post procedure Diagnosis Wound #1: Same as Pre-Procedure Wound #2 Pre-procedure diagnosis of Wound #2 is a Venous Leg Ulcer located on the Left,Medial Ankle .Severity of Tissue Pre Debridement is: Fat layer exposed. There was a  Chemical/Enzymatic/Mechanical debridement performed by Kalman Shan, DO. With the following instrument(s): Gauze. Other agent used was gauze and wound cleanser. A time out was conducted at 15:16, prior to the start of the procedure. There was no bleeding. The procedure was tolerated well. Post Debridement Measurements: 1.4cm length x  1.1cm width x 0.1cm depth; 0.121cm^3 volume. Character of Wound/Ulcer Post Debridement is stable. Severity of Tissue Post Debridement is: Fat layer exposed. Post procedure Diagnosis Wound #2: Same as Pre-Procedure Wound #3 Pre-procedure diagnosis of Wound #3 is an Infection - not elsewhere classified located on the Left Hand - 4th Digit . There was a Chemical/Enzymatic/Mechanical debridement performed by Kalman Shan, DO. With the following instrument(s): Gauze. Other agent used was gauze and wound cleanser. A time out was conducted at 15:16, prior to the start of the procedure. There was no bleeding. The procedure was tolerated well. Post Debridement Measurements: 0.7cm length x 2.1cm width x 0.2cm depth; 0.231cm^3 volume. Character of Wound/Ulcer Post Debridement is stable. Post procedure Diagnosis Wound #3: Same as Pre-Procedure Plan Follow-up Appointments: Return Appointment in 1 week. - with Dr. Heber [place] Other: - **Call and make follow up appt with Vascular Appt** Bathing/ Shower/ Hygiene: May shower and wash wound with soap and water. - use antibacterial soap when changing dressing. Edema Control - Lymphedema / SCD / Other: Elevate legs to the level of the heart or above for 30 minutes daily and/or when sitting, a frequency of: - throughout the day Avoid standing for long periods of time. Moisturize legs daily. Additional Orders / Instructions: Follow Nutritious Diet - Monitor/Control Blood Sugars WOUND #1: - Lower Leg Wound Laterality: Left, Medial Cleanser: Soap and Water 1 x Per Day/30 Days Discharge Instructions: May shower and wash wound with dial antibacterial soap and water prior to dressing change. Prim Dressing: Hydrofera Blue Ready Foam, 4x5 in (DME) (Generic) 1 x Per Day/30 Days ary Discharge Instructions: Apply to wound bed as instructed Prim Dressing: Santyl Ointment 1 x Per Day/30 Days ary Discharge Instructions: In Office Only Secondary Dressing: ABD Pad, 5x9  (DME) (Generic) 1 x Per Day/30 Days Discharge Instructions: Apply over primary dressing as directed. Secured With: [employer], 4.5x3.1 (in/yd) (DME) (Generic) 1 x Per Day/30 Days Discharge Instructions: Secure with Kerlix as directed. Secured With: 47M Medipore H Soft Cloth Surgical T ape, 4 x 10 (in/yd) (DME) (Generic) 1 x Per Day/30 Days Discharge Instructions: Secure with tape as directed. WOUND #2: - Ankle Wound Laterality: Left, Medial Cleanser: Soap and Water 1 x Per Day/30 Days Discharge Instructions: May shower and wash wound with dial antibacterial soap and water prior to dressing change. Prim Dressing: Hydrofera Blue Ready Foam, 4x5 in (DME) (Generic) 1 x Per Day/30 Days ary Discharge Instructions: Apply to wound bed as instructed Prim Dressing: Santyl Ointment 1 x Per Day/30 Days ary Discharge Instructions: In Office Only Secondary Dressing: ABD Pad, 5x9 (DME) (Generic) 1 x Per Day/30 Days Discharge Instructions: Apply over primary dressing as directed. Secured With: [employer] [employer], 4.5x3.1 (in/yd) (DME) (Generic) 1 x Per Day/30 Days Discharge Instructions: Secure with Kerlix as directed. Secured With: 47M Medipore H Soft Cloth Surgical T ape, 4 x 10 (in/yd) (DME) (Generic) 1 x Per Day/30 Days Discharge Instructions: Secure with tape as directed. WOUND #3: - Hand - 4th Digit Wound Laterality: Left Cleanser: Soap and Water 1 x Per Day/30 Days Discharge Instructions: May shower and wash wound with dial antibacterial soap and water prior to dressing change. Topical: Mupirocin Ointment 1 x  Per Day/30 Days Discharge Instructions: Apply Mupirocin (Bactroban) as instructed Secondary Dressing: Woven Gauze Sponge, Non-Sterile 4x4 in (DME) (Generic) 1 x Per Day/30 Days Discharge Instructions: Apply over primary dressing as directed. Secured With: Child psychotherapist, Sterile 2x75 (in/in) (DME) (Generic) 1 x Per Day/30 Days Discharge Instructions: Secure with  stretch gauze as directed. Secured With: 54M Medipore H Soft Cloth Surgical T ape, 4 x 10 (in/yd) (DME) (Generic) 1 x Per Day/30 Days Discharge Instructions: Secure with tape as directed. 1. Hydrofera Blue 2. Mupirocin ointment 3. Follow-up in 1 week 4. Follow-up with vein and vascular Electronic Signature(s) Signed: 09/24/2021 3:53:57 PM By: Kalman Shan DO Entered By: Kalman Shan on 09/24/2021 15:53:14 -------------------------------------------------------------------------------- HxROS Details Patient Name: Date of Service: Edward [name], [place] MES L. 09/24/2021 2:30 PM Medical Record Number: [id number] Patient Account Number: [id number] Date of Birth/Sex: Treating RN: May 25, 1971 (51 y.o. Male) Lorrin Jackson Primary Care Provider: [name] Other Clinician: Referring Provider: Treating Provider/Extender: Christie Nottingham Weeks in Treatment: 0 Information Obtained From Patient Eyes Complaints and Symptoms: Positive for: Glasses / Contacts Medical History: Positive for: Cataracts - Surgery Ear/Nose/Mouth/Throat Complaints and Symptoms: Negative for: Chronic sinus problems or rhinitis Respiratory Complaints and Symptoms: Negative for: Chronic or frequent coughs; Shortness of Breath Gastrointestinal Complaints and Symptoms: Negative for: Frequent diarrhea; Nausea; Vomiting Integumentary (Skin) Complaints and Symptoms: Positive for: Wounds - L Leg Musculoskeletal Complaints and Symptoms: Negative for: Muscle Pain; Muscle Weakness Medical History: Positive for: Osteoarthritis; Osteomyelitis Psychiatric Complaints and Symptoms: Negative for: Claustrophobia; Suicidal Hematologic/Lymphatic Cardiovascular Medical History: Positive for: Arrhythmia; Congestive Heart Failure; Coronary Artery Disease; Myocardial Infarction; Peripheral Venous Disease Endocrine Medical History: Positive for: Type II Diabetes Time with diabetes: 2004 Treated with:  Insulin Blood sugar tested every day: Yes Tested : daily Genitourinary Medical History: Positive for: End Stage Renal Disease - Dialysis Immunological Neurologic Medical History: Positive for: Neuropathy Oncologic HBO Extended History Items Eyes: Cataracts Immunizations Pneumococcal Vaccine: Received Pneumococcal Vaccination: Yes Received Pneumococcal Vaccination On or After 60th Birthday: No Implantable Devices Yes Family and Social History Cancer: Yes - Mother; Diabetes: Yes - Siblings; Heart Disease: Yes - Mother,Father; Hereditary Spherocytosis: No; Hypertension: No; Kidney Disease: Yes - Siblings,Father; Lung Disease: No; Seizures: No; Stroke: No; Thyroid Problems: No; Tuberculosis: No; Former smoker - Quit 2 years ago; Marital Status - Widowed; Alcohol Use: Rarely; Drug Use: No History; Caffeine Use: Rarely; Financial Concerns: No; Food, Clothing or Shelter Needs: No; Support System Lacking: No; Transportation Concerns: No Electronic Signature(s) Signed: 09/24/2021 3:53:57 PM By: Kalman Shan DO Signed: 09/24/2021 4:43:35 PM By: Lorrin Jackson Entered By: Lorrin Jackson on 09/24/2021 14:43:12 -------------------------------------------------------------------------------- SuperBill Details Patient Name: Date of Service: Edward [name], Edward [name] MES L. 09/24/2021 Medical Record Number: [id number] Patient Account Number: [id number] Date of Birth/Sex: Treating RN: 1971/02/24 (51 y.o. Male) Lorrin Jackson Primary Care Provider: [name] Other Clinician: Referring Provider: Treating Provider/Extender: Christie Nottingham Weeks in Treatment: 0 Diagnosis Coding ICD-10 Codes Code Description V49.449Q Unspecified open wound of left index finger with damage to nail, initial encounter [id number] Non-pressure chronic ulcer of other part of left lower leg with fat layer exposed I87.332 Chronic venous hypertension (idiopathic) with ulcer and inflammation of left lower  extremity I73.9 Peripheral vascular disease, unspecified Facility Procedures CPT4 Code: [id number] Description: 99213 - WOUND CARE VISIT-LEV 3 EST PT Modifier: 25 Quantity: 1 CPT4 Code: [id number] Description: 90300 - DEBRIDE W/O ANES NON SELECT ICD-10 Diagnosis Description L97.822 Non-pressure chronic ulcer of other part of  left lower leg with fat layer expos Modifier: ed Quantity: 1 Physician Procedures : CPT4 Code Description Modifier [id number] - WC PHYS LEVEL 4 - NEW PT 1 ICD-10 Diagnosis Description Z50.158E Unspecified open wound of left index finger with damage to nail, initial encounter L97.822 Non-pressure chronic ulcer of other part of left  lower leg with fat layer exposed I87.332 Chronic venous hypertension (idiopathic) with ulcer and inflammation of left lower extremity I73.9 Peripheral vascular disease, unspecified Quantity: Electronic Signature(s) Signed: 09/24/2021 4:04:28 PM By: Lorrin Jackson Signed: 09/24/2021 4:20:11 PM By: Kalman Shan DO Previous Signature: 09/24/2021 3:53:57 PM Version By: Kalman Shan DO Entered By: Lorrin Jackson on 09/24/2021 16:04:27

## 2021-09-24 NOTE — Progress Notes (Signed)
Edward [name], Edward [name] ([id number]) Visit Report for 09/24/2021 Allergy List Details Patient Name: Date of Service: Edward [name] MES L. 09/24/2021 2:30 PM Medical Record Number: [id number] Patient Account Number: [id number] Date of Birth/Sex: Treating RN: [birth year] (51 y.o. Male) Lorrin Jackson Primary Care Brenlyn Beshara: [name] Other Clinician: Referring Audray Rumore: Treating Bernedette Auston/Extender: Christie Nottingham Weeks in Treatment: 0 Allergies Active Allergies Crestor Reaction: Rash Allergy Notes Electronic Signature(s) Signed: 09/24/2021 4:43:35 PM By: Lorrin Jackson Entered By: Lorrin Jackson on 09/24/2021 14:32:03 -------------------------------------------------------------------------------- Arrival Information Details Patient Name: Date of Service: Edward [name], Edward [name] MES L. 09/24/2021 2:30 PM Medical Record Number: [id number] Patient Account Number: [id number] Date of Birth/Sex: Treating RN: 05/10/1971 (51 y.o. Male) Lorrin Jackson Primary Care Alandra Sando: [name] Other Clinician: Referring Zyion Leidner: Treating Aleatha Taite/Extender: Christie Nottingham Weeks in Treatment: 0 Visit Information Patient Arrived: Lyndel Pleasure Time: 14:26 Transfer Assistance: None Patient Identification Verified: Yes Secondary Verification Process Completed: Yes Patient Requires Transmission-Based Precautions: No Patient Has Alerts: Yes Patient Alerts: Patient on Blood Thinner Electronic Signature(s) Signed: 09/24/2021 4:06:27 PM By: Lorrin Jackson Entered By: Lorrin Jackson on 09/24/2021 16:06:27 -------------------------------------------------------------------------------- Clinic Level of Care Assessment Details Patient Name: Date of Service: Edward [name] MES L. 09/24/2021 2:30 PM Medical Record Number: [id number] Patient Account Number: [id number] Date of Birth/Sex: Treating RN: April 13, 1971 (50 y.o. Male) Lorrin Jackson Primary Care Malayia Spizzirri: [name] Other  Clinician: Referring Heba Ige: Treating Khiree Bukhari/Extender: Christie Nottingham Weeks in Treatment: 0 Clinic Level of Care Assessment Items TOOL 1 Quantity Score X- 1 0 Use when EandM and Procedure is performed on INITIAL visit ASSESSMENTS - Nursing Assessment / Reassessment X- 1 20 General Physical Exam (combine w/ comprehensive assessment (listed just below) when performed on new pt. evals) X- 1 25 Comprehensive Assessment (HX, ROS, Risk Assessments, Wounds Hx, etc.) ASSESSMENTS - Wound and Skin Assessment / Reassessment []  - 0 Dermatologic / Skin Assessment (not related to wound area) ASSESSMENTS - Ostomy and/or Continence Assessment and Care []  - 0 Incontinence Assessment and Management []  - 0 Ostomy Care Assessment and Management (repouching, etc.) PROCESS - Coordination of Care []  - 0 Simple Patient / Family Education for ongoing care X- 1 20 Complex (extensive) Patient / Family Education for ongoing care X- 1 10 Staff obtains Programmer, systems, Records, T Results / Process Orders est []  - 0 Staff telephones HHA, Nursing Homes / Clarify orders / etc []  - 0 Routine Transfer to another Facility (non-emergent condition) []  - 0 Routine Hospital Admission (non-emergent condition) []  - 0 New Admissions / Biomedical engineer / Ordering NPWT Apligraf, etc. , []  - 0 Emergency Hospital Admission (emergent condition) PROCESS - Special Needs []  - 0 Pediatric / Minor Patient Management []  - 0 Isolation Patient Management []  - 0 Hearing / Language / Visual special needs []  - 0 Assessment of Community assistance (transportation, D/C planning, etc.) []  - 0 Additional assistance / Altered mentation []  - 0 Support Surface(s) Assessment (bed, cushion, seat, etc.) INTERVENTIONS - Miscellaneous []  - 0 External ear exam []  - 0 Patient Transfer (multiple staff / Civil Service fast streamer / Similar devices) []  - 0 Simple Staple / Suture removal (25 or less) []  - 0 Complex Staple /  Suture removal (26 or more) []  - 0 Hypo/Hyperglycemic Management (do not check if billed separately) X- 1 15 Ankle / Brachial Index (ABI) - do not check if billed separately Has the patient been seen at the hospital within the last three years: Yes Total Score:  90 Level Of Care: New/Established - Level 3 Electronic Signature(s) Signed: 09/24/2021 4:43:35 PM By: Lorrin Jackson Entered By: Lorrin Jackson on 09/24/2021 16:04:13 -------------------------------------------------------------------------------- Encounter Discharge Information Details Patient Name: Date of Service: Edward [name], Edward [name] MES L. 09/24/2021 2:30 PM Medical Record Number: [id number] Patient Account Number: [id number] Date of Birth/Sex: Treating RN: 1970-12-15 (51 y.o. Male) Lorrin Jackson Primary Care Jeanpaul Biehl: [name] Other Clinician: Referring Hadlei Stitt: Treating Weslynn Ke/Extender: Christie Nottingham Weeks in Treatment: 0 Encounter Discharge Information Items Post Procedure Vitals Discharge Condition: Stable Temperature (F): 98.2 Ambulatory Status: Cane Pulse (bpm): 92 Discharge Destination: Home Respiratory Rate (breaths/min): 18 Transportation: Private Auto Blood Pressure (mmHg): 142/78 Schedule Follow-up Appointment: Yes Clinical Summary of Care: Provided on 09/24/2021 Form Type Recipient Paper Patient Patient Electronic Signature(s) Signed: 09/24/2021 4:06:13 PM By: Lorrin Jackson Entered By: Lorrin Jackson on 09/24/2021 16:06:13 -------------------------------------------------------------------------------- Lower Extremity Assessment Details Patient Name: Date of Service: Edward [name] MES L. 09/24/2021 2:30 PM Medical Record Number: [id number] Patient Account Number: [id number] Date of Birth/Sex: Treating RN: August 16, 1971 (50 y.o. Male) Lorrin Jackson Primary Care Lior Cartelli: [name] Other Clinician: Referring Avaneesh Pepitone: Treating Mackenzey Crownover/Extender: Christie Nottingham Weeks  in Treatment: 0 Edema Assessment Assessed: [Left: Yes] [Right: No] Edema: [Left: N] [Right: o] Calf Left: Right: Point of Measurement: 39 cm From Medial Instep 40.5 cm Ankle Left: Right: Point of Measurement: 11 cm From Medial Instep 24.5 cm Knee To Floor Left: Right: From Medial Instep 51 cm Vascular Assessment Pulses: Dorsalis Pedis Palpable: [Left:Yes] Blood Pressure: Brachial: [Left:142] Ankle: [Left:Dorsalis Pedis: 80] Ankle Brachial Index: [Left:0.56] Electronic Signature(s) Signed: 09/24/2021 4:43:35 PM By: Lorrin Jackson Entered By: Lorrin Jackson on 09/24/2021 15:02:46 -------------------------------------------------------------------------------- Multi Wound Chart Details Patient Name: Date of Service: Edward [name], Edward [name] MES L. 09/24/2021 2:30 PM Medical Record Number: [id number] Patient Account Number: [id number] Date of Birth/Sex: Treating RN: 1970-09-10 (50 y.o. Male) Lorrin Jackson Primary Care Rinda Rollyson: [name] Other Clinician: Referring Saleah Rishel: Treating Virginia Curl/Extender: Christie Nottingham Weeks in Treatment: 0 Vital Signs Height(in): 73 Capillary Blood Glucose(mg/dl): 106 Weight(lbs): 233 Pulse(bpm): 51 Body Mass Index(BMI): 30.7 Blood Pressure(mmHg): 142/78 Temperature(F): 98.2 Respiratory Rate(breaths/min): 18 Photos: Left, Medial Lower Leg Left, Medial Ankle Left Hand - 4th Digit Wound Location: Gradually Appeared Gradually Appeared Trauma Wounding Event: Venous Leg Ulcer Venous Leg Ulcer Infection - not elsewhere classified Primary Etiology: Cataracts, Arrhythmia, Congestive Cataracts, Arrhythmia, Congestive Cataracts, Arrhythmia, Congestive Comorbid History: Heart Failure, Coronary Artery Heart Failure, Coronary Artery Heart Failure, Coronary Artery Disease, Myocardial Infarction, Disease, Myocardial Infarction, Disease, Myocardial Infarction, Peripheral Venous Disease, Type II Peripheral Venous Disease, Type II Peripheral  Venous Disease, Type II Diabetes, End Stage Renal Disease, Diabetes, End Stage Renal Disease, Diabetes, End Stage Renal Disease, Osteoarthritis, Osteomyelitis, Osteoarthritis, Osteomyelitis, Osteoarthritis, Osteomyelitis, Neuropathy Neuropathy Neuropathy 07/23/2021 07/30/2021 09/22/2021 Date Acquired: 0 0 0 Weeks of Treatment: Open Open Open Wound Status: No No No Wound Recurrence: 4.2x2.2x0.2 1.4x1.1x0.1 0.7x2.1x0.2 Measurements L x W x D (cm) 7.257 1.21 1.155 A (cm) : rea 1.451 0.121 0.231 Volume (cm) : 0.00% 0.00% 0.00% % Reduction in A rea: 0.00% 0.00% 0.00% % Reduction in Volume: Full Thickness Without Exposed Full Thickness Without Exposed Full Thickness Without Exposed Classification: Support Structures Support Structures Support Structures Medium Medium Medium Exudate A mount: Serosanguineous Serosanguineous Serosanguineous Exudate Type: red, brown red, brown red, brown Exudate Color: Distinct, outline attached Distinct, outline attached Distinct, outline attached Wound Margin: Large (67-100%) Large (67-100%) Large (67-100%) Granulation A mount: Red, Pink Red, Pink  Red Granulation Quality: Small (1-33%) Small (1-33%) Small (1-33%) Necrotic A mount: Fat Layer (Subcutaneous Tissue): Yes Fat Layer (Subcutaneous Tissue): Yes Fat Layer (Subcutaneous Tissue): Yes Exposed Structures: Fascia: No Fascia: No Fascia: No Tendon: No Tendon: No Tendon: No Muscle: No Muscle: No Muscle: No Joint: No Joint: No Joint: No Bone: No Bone: No Bone: No None None None Epithelialization: Chemical/Enzymatic/Mechanical Chemical/Enzymatic/Mechanical Chemical/Enzymatic/Mechanical Debridement: Pre-procedure Verification/Time Out 15:16 15:16 15:16 Taken: Other(Gauze) Other(Gauze) Other(Gauze) Instrument: None None None Bleeding: Procedure was tolerated well Procedure was tolerated well Procedure was tolerated well Debridement Treatment Response: 4.2x2.2x0.2 1.4x1.1x0.1  0.7x2.1x0.2 Post Debridement Measurements L x W x D (cm) 1.451 0.121 0.231 Post Debridement Volume: (cm) Debridement Debridement Debridement Procedures Performed: Treatment Notes Electronic Signature(s) Signed: 09/24/2021 3:53:57 PM By: Kalman Shan DO Signed: 09/24/2021 4:43:35 PM By: Lorrin Jackson Entered By: Kalman Shan on 09/24/2021 15:45:42 -------------------------------------------------------------------------------- Multi-Disciplinary Care Plan Details Patient Name: Date of Service: Edward [name], Edward [name] MES L. 09/24/2021 2:30 PM Medical Record Number: [id number] Patient Account Number: [id number] Date of Birth/Sex: Treating RN: 1971/05/13 (51 y.o. Male) Lorrin Jackson Primary Care Eldredge Veldhuizen: [name] Other Clinician: Referring Bevan Disney: Treating Delailah Spieth/Extender: Christie Nottingham Weeks in Treatment: 0 Active Inactive Nutrition Nursing Diagnoses: Impaired glucose control: actual or potential Goals: Patient/caregiver verbalizes understanding of need to maintain therapeutic glucose control per primary care physician Date Initiated: 09/24/2021 Target Resolution Date: 10/22/2021 Goal Status: Active Interventions: Provide education on elevated blood sugars and impact on wound healing Notes: Wound/Skin Impairment Nursing Diagnoses: Impaired tissue integrity Goals: Patient/caregiver will verbalize understanding of skin care regimen Date Initiated: 09/24/2021 Target Resolution Date: 10/22/2021 Goal Status: Active Ulcer/skin breakdown will have a volume reduction of 30% by week 4 Date Initiated: 09/24/2021 Target Resolution Date: 10/22/2021 Goal Status: Active Interventions: Assess patient/caregiver ability to obtain necessary supplies Assess patient/caregiver ability to perform ulcer/skin care regimen upon admission and as needed Assess ulceration(s) every visit Provide education on ulcer and skin care Treatment Activities: Topical wound management  initiated : 09/24/2021 Notes: Electronic Signature(s) Signed: 09/24/2021 4:43:35 PM By: Lorrin Jackson Entered By: Lorrin Jackson on 09/24/2021 15:00:15 -------------------------------------------------------------------------------- Pain Assessment Details Patient Name: Date of Service: Edward [name] MES L. 09/24/2021 2:30 PM Medical Record Number: [id number] Patient Account Number: [id number] Date of Birth/Sex: Treating RN: Feb 24, 1971 (51 y.o. Male) Lorrin Jackson Primary Care Tiffine Henigan: [name] Other Clinician: Referring Donielle Kaigler: Treating Lainy Wrobleski/Extender: Christie Nottingham Weeks in Treatment: 0 Active Problems Location of Pain Severity and Description of Pain Patient Has Paino No Site Locations Pain Management and Medication Current Pain Management: Electronic Signature(s) Signed: 09/24/2021 4:43:35 PM By: Lorrin Jackson Entered By: Lorrin Jackson on 09/24/2021 14:46:02 -------------------------------------------------------------------------------- Patient/Caregiver Education Details Patient Name: Date of Service: Edward [name] MES L. 2/6/2023andnbsp2:30 PM Medical Record Number: [id number] Patient Account Number: [id number] Date of Birth/Gender: Treating RN: May 18, 1971 (50 y.o. Male) Lorrin Jackson Primary Care Physician: [name] Other Clinician: Referring Physician: Treating Physician/Extender: Christie Nottingham Weeks in Treatment: 0 Education Assessment Education Provided To: Patient Education Topics Provided Elevated Blood Sugar/ Impact on Healing: Methods: Explain/Verbal, Printed Responses: State content correctly Wound/Skin Impairment: Methods: Demonstration, Explain/Verbal, Printed Responses: State content correctly Electronic Signature(s) Signed: 09/24/2021 4:43:35 PM By: Lorrin Jackson Entered By: Lorrin Jackson on 09/24/2021 15:00:49 -------------------------------------------------------------------------------- Wound  Assessment Details Patient Name: Date of Service: Edward [name], Edward [name] MES L. 09/24/2021 2:30 PM Medical Record Number: [id number] Patient Account Number: [id number] Date of Birth/Sex: Treating RN: April 30, 1971 (51 y.o. Male) Lorrin Jackson Primary Care  Tabitha Tupper: [name] Other Clinician: Referring Quanna Wittke: Treating Noris Kulinski/Extender: Christie Nottingham Weeks in Treatment: 0 Wound Status Wound Number: 1 Primary Venous Leg Ulcer Etiology: Wound Location: Left, Medial Lower Leg Wound Open Wounding Event: Gradually Appeared Status: Date Acquired: 07/23/2021 Comorbid Cataracts, Arrhythmia, Congestive Heart Failure, Coronary Artery Weeks Of Treatment: 0 History: Disease, Myocardial Infarction, Peripheral Venous Disease, Type II Clustered Wound: No Diabetes, End Stage Renal Disease, Osteoarthritis, Osteomyelitis, Neuropathy Photos Wound Measurements Length: (cm) 4.2 Width: (cm) 2.2 Depth: (cm) 0.2 Area: (cm) 7.257 Volume: (cm) 1.451 % Reduction in Area: 0% % Reduction in Volume: 0% Epithelialization: None Tunneling: No Undermining: No Wound Description Classification: Full Thickness Without Exposed Support Structures Wound Margin: Distinct, outline attached Exudate Amount: Medium Exudate Type: Serosanguineous Exudate Color: red, brown Foul Odor After Cleansing: No Slough/Fibrino Yes Wound Bed Granulation Amount: Large (67-100%) Exposed Structure Granulation Quality: Red, Pink Fascia Exposed: No Necrotic Amount: Small (1-33%) Fat Layer (Subcutaneous Tissue) Exposed: Yes Necrotic Quality: Adherent Slough Tendon Exposed: No Muscle Exposed: No Joint Exposed: No Bone Exposed: No Electronic Signature(s) Signed: 09/24/2021 4:15:16 PM By: Dellie Catholic RN Signed: 09/24/2021 4:43:35 PM By: Lorrin Jackson Entered By: Dellie Catholic on 09/24/2021 14:55:18 -------------------------------------------------------------------------------- Wound Assessment  Details Patient Name: Date of Service: Edward [name], Edward [name] MES L. 09/24/2021 2:30 PM Medical Record Number: [id number] Patient Account Number: [id number] Date of Birth/Sex: Treating RN: [birth year] (51 y.o. Male) Lorrin Jackson Primary Care Kyreese Chio: [name] Other Clinician: Referring Rocko Fesperman: Treating Jaleesa Cervi/Extender: Christie Nottingham Weeks in Treatment: 0 Wound Status Wound Number: 2 Primary Venous Leg Ulcer Etiology: Wound Location: Left, Medial Ankle Wound Open Wounding Event: Gradually Appeared Status: Date Acquired: 07/30/2021 Comorbid Cataracts, Arrhythmia, Congestive Heart Failure, Coronary Artery Weeks Of Treatment: 0 History: Disease, Myocardial Infarction, Peripheral Venous Disease, Type II Clustered Wound: No Diabetes, End Stage Renal Disease, Osteoarthritis, Osteomyelitis, Neuropathy Photos Wound Measurements Length: (cm) 1.4 Width: (cm) 1.1 Depth: (cm) 0.1 Area: (cm) 1.21 Volume: (cm) 0.121 % Reduction in Area: 0% % Reduction in Volume: 0% Epithelialization: None Tunneling: No Undermining: No Wound Description Classification: Full Thickness Without Exposed Support Structures Wound Margin: Distinct, outline attached Exudate Amount: Medium Exudate Type: Serosanguineous Exudate Color: red, brown Foul Odor After Cleansing: No Slough/Fibrino Yes Wound Bed Granulation Amount: Large (67-100%) Exposed Structure Granulation Quality: Red, Pink Fascia Exposed: No Necrotic Amount: Small (1-33%) Fat Layer (Subcutaneous Tissue) Exposed: Yes Necrotic Quality: Adherent Slough Tendon Exposed: No Muscle Exposed: No Joint Exposed: No Bone Exposed: No Treatment Notes Wound #2 (Ankle) Wound Laterality: Left, Medial Cleanser Soap and Water Discharge Instruction: May shower and wash wound with dial antibacterial soap and water prior to dressing change. Peri-Wound Care Topical Primary Dressing Hydrofera Blue Ready Foam, 4x5 in Discharge  Instruction: Apply to wound bed as instructed Santyl Ointment Discharge Instruction: In Office Only Secondary Dressing ABD Pad, 5x9 Discharge Instruction: Apply over primary dressing as directed. Secured With [employer], 4.5x3.1 (in/yd) Discharge Instruction: Secure with Kerlix as directed. 26M Medipore H Soft Cloth Surgical T ape, 4 x 10 (in/yd) Discharge Instruction: Secure with tape as directed. Compression Wrap Compression Stockings Add-Ons Electronic Signature(s) Signed: 09/24/2021 4:15:16 PM By: Dellie Catholic RN Signed: 09/24/2021 4:43:35 PM By: Lorrin Jackson Entered By: Dellie Catholic on 09/24/2021 14:56:24 -------------------------------------------------------------------------------- Wound Assessment Details Patient Name: Date of Service: Edward [name], Edward [name] MES L. 09/24/2021 2:30 PM Medical Record Number: [id number] Patient Account Number: [id number] Date of Birth/Sex: Treating RN: 1970-10-08 (51 y.o. Male) Lorrin Jackson Primary Care Naleyah Ohlinger: Berdine Addison,  Barrie Folk Other Clinician: Referring Vernon Maish: Treating Ayron Fillinger/Extender: Christie Nottingham Weeks in Treatment: 0 Wound Status Wound Number: 3 Primary Infection - not elsewhere classified Etiology: Wound Location: Left Hand - 4th Digit Wound Open Wounding Event: Trauma Status: Date Acquired: 09/22/2021 Comorbid Cataracts, Arrhythmia, Congestive Heart Failure, Coronary Artery Weeks Of Treatment: 0 History: Disease, Myocardial Infarction, Peripheral Venous Disease, Type II Clustered Wound: No Diabetes, End Stage Renal Disease, Osteoarthritis, Osteomyelitis, Neuropathy Photos Wound Measurements Length: (cm) 0.7 Width: (cm) 2.1 Depth: (cm) 0.2 Area: (cm) 1.155 Volume: (cm) 0.231 % Reduction in Area: 0% % Reduction in Volume: 0% Epithelialization: None Tunneling: No Undermining: No Wound Description Classification: Full Thickness Without Exposed Support Structu Wound Margin: Distinct, outline  attached Exudate Amount: Medium Exudate Type: Serosanguineous Exudate Color: red, brown res Foul Odor After Cleansing: No Slough/Fibrino Yes Wound Bed Granulation Amount: Large (67-100%) Exposed Structure Granulation Quality: Red Fascia Exposed: No Necrotic Amount: Small (1-33%) Fat Layer (Subcutaneous Tissue) Exposed: Yes Necrotic Quality: Adherent Slough Tendon Exposed: No Muscle Exposed: No Joint Exposed: No Bone Exposed: No Treatment Notes Wound #3 (Hand - 4th Digit) Wound Laterality: Left Cleanser Soap and Water Discharge Instruction: May shower and wash wound with dial antibacterial soap and water prior to dressing change. Peri-Wound Care Topical Mupirocin Ointment Discharge Instruction: Apply Mupirocin (Bactroban) as instructed Primary Dressing Secondary Dressing Woven Gauze Sponge, Non-Sterile 4x4 in Discharge Instruction: Apply over primary dressing as directed. Secured With Conforming Stretch Gauze Bandage, Sterile 2x75 (in/in) Discharge Instruction: Secure with stretch gauze as directed. 30M Medipore H Soft Cloth Surgical T ape, 4 x 10 (in/yd) Discharge Instruction: Secure with tape as directed. Compression Wrap Compression Stockings Add-Ons Electronic Signature(s) Signed: 09/24/2021 4:15:16 PM By: Dellie Catholic RN Signed: 09/24/2021 4:43:35 PM By: Lorrin Jackson Entered By: Dellie Catholic on 09/24/2021 14:57:13 -------------------------------------------------------------------------------- Vitals Details Patient Name: Date of Service: Edward [name], [name] 09/24/2021 2:30 PM Medical Record Number: [id number] Patient Account Number: [id number] Date of Birth/Sex: Treating RN: 1971/06/16 (51 y.o. Male) Lorrin Jackson Primary Care Rosha Cocker: [name] Other Clinician: Referring Khiry Pasquariello: Treating Drusilla Wampole/Extender: Christie Nottingham Weeks in Treatment: 0 Vital Signs Time Taken: 14:32 Temperature (F): 98.2 Height (in): 73 Pulse (bpm):  92 Source: Stated Respiratory Rate (breaths/min): 18 Weight (lbs): 233 Blood Pressure (mmHg): 142/78 Source: Stated Capillary Blood Glucose (mg/dl): 106 Body Mass Index (BMI): 30.7 Reference Range: 80 - 120 mg / dl Electronic Signature(s) Signed: 09/24/2021 4:43:35 PM By: Lorrin Jackson Entered By: Lorrin Jackson on 09/24/2021 14:33:26

## 2021-09-24 NOTE — Progress Notes (Signed)
AIMAR, BORGHI ([id number]) Visit Report for 09/24/2021 Abuse Risk Screen Details Patient Name: Date of Service: Edward [name] MES L. 09/24/2021 2:30 PM Medical Record Number: [id number] Patient Account Number: [id number] Date of Birth/Sex: Treating RN: 1970/09/18 (51 y.o. Male) Edward [name] Primary Care Donnarae Rae: [name] Other Clinician: Referring Seaton Hofmann: Treating Jeniffer Culliver/Extender: Christie Nottingham Weeks in Treatment: 0 Abuse Risk Screen Items Answer ABUSE RISK SCREEN: Has anyone close to you tried to hurt or harm you recentlyo No Do you feel uncomfortable with anyone in your familyo No Has anyone forced you do things that you didnt want to doo No Electronic Signature(s) Signed: 09/24/2021 4:43:35 PM By: Edward [name] Entered By: Edward [name] on 09/24/2021 14:43:40 -------------------------------------------------------------------------------- Activities of Daily Living Details Patient Name: Date of Service: Edward [name] MES L. 09/24/2021 2:30 PM Medical Record Number: [id number] Patient Account Number: [id number] Date of Birth/Sex: Treating RN: [birth year] (51 y.o. Male) Edward [name] Primary Care Cammy Sanjurjo: [name] Other Clinician: Referring Lipa Knauff: Treating Cella Cappello/Extender: Christie Nottingham Weeks in Treatment: 0 Activities of Daily Living Items Answer Activities of Daily Living (Please select one for each item) Drive Automobile Need Assistance T Medications ake Completely Able Use T elephone Completely Able Care for Appearance Completely Able Use T oilet Completely Able Bath / Shower Completely Able Dress Self Completely Able Feed Self Completely Able Walk Completely Able Get In / Out Bed Completely Able Housework Completely Able Prepare Meals Completely [place] for Self Completely Able Electronic Signature(s) Signed: 09/24/2021 4:43:35 PM By: Edward [name] Entered By: Edward [name] on  09/24/2021 14:44:18 -------------------------------------------------------------------------------- Education Screening Details Patient Name: Date of Service: Edward [name], Edward [name] MES L. 09/24/2021 2:30 PM Medical Record Number: [id number] Patient Account Number: [id number] Date of Birth/Sex: Treating RN: Jan 24, 1971 (51 y.o. Male) Edward [name] Primary Care Phebe Dettmer: [name] Other Clinician: Referring Lot Medford: Treating Shaliah Wann/Extender: Christie Nottingham Weeks in Treatment: 0 Primary Learner Assessed: Patient Learning Preferences/Education Level/Primary Language Learning Preference: Explanation, Demonstration, Printed Material Highest Education Level: High School Preferred Language: English Cognitive Barrier Language Barrier: No Translator Needed: No Memory Deficit: No Emotional Barrier: No Cultural/Religious Beliefs Affecting Medical Care: No Physical Barrier Impaired Vision: Yes Glasses Impaired Hearing: No Decreased Hand dexterity: No Knowledge/Comprehension Knowledge Level: Medium Comprehension Level: Medium Ability to understand written instructions: High Ability to understand verbal instructions: High Motivation Anxiety Level: Calm Cooperation: Cooperative Education Importance: Acknowledges Need Interest in Health Problems: Asks Questions Perception: Coherent Willingness to Engage in Self-Management Medium Activities: Readiness to Engage in Self-Management Medium Activities: Electronic Signature(s) Signed: 09/24/2021 4:43:35 PM By: Edward [name] Entered By: Edward [name] on 09/24/2021 14:45:06 -------------------------------------------------------------------------------- Fall Risk Assessment Details Patient Name: Date of Service: Edward [name], [name] 09/24/2021 2:30 PM Medical Record Number: [id number] Patient Account Number: [id number] Date of Birth/Sex: Treating RN: [birth year] (50 y.o. Male) Edward [name] Primary Care Ejay Lashley: [name] Other Clinician: Referring Seerat Peaden: Treating Dailen Mcclish/Extender: Christie Nottingham Weeks in Treatment: 0 Fall Risk Assessment Items Have you had 2 or more falls in the last 12 monthso 0 No Have you had any fall that resulted in injury in the last 12 monthso 0 No FALLS RISK SCREEN History of falling - immediate or within 3 months 0 No Secondary diagnosis (Do you have 2 or more medical diagnoseso) 15 Yes Ambulatory aid None/bed rest/wheelchair/nurse 0 Yes Crutches/cane/walker 0 No Furniture 0 No Intravenous therapy Access/Saline/Heparin Lock 0 No Gait/Transferring Normal/ bed rest/  wheelchair 0 Yes Weak (short steps with or without shuffle, stooped but able to lift head while walking, may seek 0 No support from furniture) Impaired (short steps with shuffle, may have difficulty arising from chair, head down, impaired 0 No balance) Mental Status Oriented to own ability 0 Yes Electronic Signature(s) Signed: 09/24/2021 4:43:35 PM By: Edward [name] Entered By: Edward [name] on 09/24/2021 14:45:21 -------------------------------------------------------------------------------- Foot Assessment Details Patient Name: Date of Service: Edward [name], Edward [name] MES L. 09/24/2021 2:30 PM Medical Record Number: [id number] Patient Account Number: [id number] Date of Birth/Sex: Treating RN: April 15, 1971 (51 y.o. Male) Edward [name] Primary Care Jonisha Kindig: [name] Other Clinician: Referring Roselene Gray: Treating Avani Sensabaugh/Extender: Christie Nottingham Weeks in Treatment: 0 Foot Assessment Items Site Locations + = Sensation present, - = Sensation absent, C = Callus, U = Ulcer R = Redness, W = Warmth, M = Maceration, PU = Pre-ulcerative lesion F = Fissure, S = Swelling, D = Dryness Assessment Right: Left: Other Deformity: No No Prior Foot Ulcer: Yes Yes Prior Amputation: Yes Yes Charcot Joint: No No Ambulatory Status: Ambulatory Without Help Gait: Steady Electronic  Signature(s) Signed: 09/24/2021 4:43:35 PM By: Edward [name] Entered By: Edward [name] on 09/24/2021 14:55:59 -------------------------------------------------------------------------------- Nutrition Risk Screening Details Patient Name: Date of Service: Edward [name] MES L. 09/24/2021 2:30 PM Medical Record Number: [id number] Patient Account Number: [id number] Date of Birth/Sex: Treating RN: 02/06/1971 (50 y.o. Male) Edward [name] Primary Care Mackinsey Pelland: [name] Other Clinician: Referring Abb Gobert: Treating Maurico Perrell/Extender: Christie Nottingham Weeks in Treatment: 0 Height (in): 73 Weight (lbs): 233 Body Mass Index (BMI): 30.7 Nutrition Risk Screening Items Score Screening NUTRITION RISK SCREEN: I have an illness or condition that made me change the kind and/or amount of food I eat 0 No I eat fewer than two meals per day 0 No I eat few fruits and vegetables, or milk products 0 No I have three or more drinks of beer, liquor or wine almost every day 0 No I have tooth or mouth problems that make it hard for me to eat 0 No I don't always have enough money to buy the food I need 0 No I eat alone most of the time 0 No I take three or more different prescribed or over-the-counter drugs a day 1 Yes Without wanting to, I have lost or gained 10 pounds in the last six months 0 No I am not always physically able to shop, cook and/or feed myself 0 No Nutrition Protocols Good Risk Protocol 0 No interventions needed Moderate Risk Protocol High Risk Proctocol Risk Level: Good Risk Score: 1 Electronic Signature(s) Signed: 09/24/2021 4:43:35 PM By: Edward [name] Entered By: Edward [name] on 09/24/2021 14:45:45

## 2021-10-01 ENCOUNTER — Other Ambulatory Visit: Payer: Self-pay | Admitting: Orthopedic Surgery

## 2021-10-01 ENCOUNTER — Ambulatory Visit (HOSPITAL_COMMUNITY): Payer: Medicare Other

## 2021-10-03 ENCOUNTER — Ambulatory Visit: Payer: Medicare Other | Admitting: Vascular Surgery

## 2021-10-04 ENCOUNTER — Other Ambulatory Visit: Payer: Self-pay

## 2021-10-04 ENCOUNTER — Telehealth: Payer: Self-pay | Admitting: Orthopedic Surgery

## 2021-10-04 ENCOUNTER — Inpatient Hospital Stay (HOSPITAL_COMMUNITY)
Admission: EM | Admit: 2021-10-04 | Discharge: 2021-10-06 | DRG: 255 | Disposition: A | Discharge status: Home / Self Care | Payer: Medicare Other | Attending: Family Medicine | Admitting: Family Medicine

## 2021-10-04 ENCOUNTER — Telehealth (HOSPITAL_COMMUNITY): Payer: Self-pay | Admitting: *Deleted

## 2021-10-04 ENCOUNTER — Emergency Department (HOSPITAL_COMMUNITY): Payer: Medicare Other

## 2021-10-04 DIAGNOSIS — Z794 Long term (current) use of insulin: Secondary | ICD-10-CM | POA: Diagnosis not present

## 2021-10-04 DIAGNOSIS — D631 Anemia in chronic kidney disease: Secondary | ICD-10-CM | POA: Diagnosis present

## 2021-10-04 DIAGNOSIS — Z833 Family history of diabetes mellitus: Secondary | ICD-10-CM | POA: Diagnosis not present

## 2021-10-04 DIAGNOSIS — Z992 Dependence on renal dialysis: Secondary | ICD-10-CM

## 2021-10-04 DIAGNOSIS — Z800 Family history of malignant neoplasm of digestive organs: Secondary | ICD-10-CM

## 2021-10-04 DIAGNOSIS — I25118 Atherosclerotic heart disease of native coronary artery with other forms of angina pectoris: Secondary | ICD-10-CM | POA: Diagnosis not present

## 2021-10-04 DIAGNOSIS — E875 Hyperkalemia: Secondary | ICD-10-CM | POA: Diagnosis present

## 2021-10-04 DIAGNOSIS — Z89511 Acquired absence of right leg below knee: Secondary | ICD-10-CM | POA: Diagnosis not present

## 2021-10-04 DIAGNOSIS — Z87891 Personal history of nicotine dependence: Secondary | ICD-10-CM | POA: Diagnosis not present

## 2021-10-04 DIAGNOSIS — I2510 Atherosclerotic heart disease of native coronary artery without angina pectoris: Secondary | ICD-10-CM | POA: Diagnosis present

## 2021-10-04 DIAGNOSIS — N186 End stage renal disease: Secondary | ICD-10-CM | POA: Diagnosis present

## 2021-10-04 DIAGNOSIS — I132 Hypertensive heart and chronic kidney disease with heart failure and with stage 5 chronic kidney disease, or end stage renal disease: Secondary | ICD-10-CM | POA: Diagnosis present

## 2021-10-04 DIAGNOSIS — I5020 Unspecified systolic (congestive) heart failure: Secondary | ICD-10-CM

## 2021-10-04 DIAGNOSIS — R9431 Abnormal electrocardiogram [ECG] [EKG]: Secondary | ICD-10-CM

## 2021-10-04 DIAGNOSIS — I96 Gangrene, not elsewhere classified: Secondary | ICD-10-CM | POA: Diagnosis present

## 2021-10-04 DIAGNOSIS — Z955 Presence of coronary angioplasty implant and graft: Secondary | ICD-10-CM

## 2021-10-04 DIAGNOSIS — Z01810 Encounter for preprocedural cardiovascular examination: Secondary | ICD-10-CM

## 2021-10-04 DIAGNOSIS — M1990 Unspecified osteoarthritis, unspecified site: Secondary | ICD-10-CM | POA: Diagnosis not present

## 2021-10-04 DIAGNOSIS — Z20822 Contact with and (suspected) exposure to covid-19: Secondary | ICD-10-CM | POA: Diagnosis present

## 2021-10-04 DIAGNOSIS — N2581 Secondary hyperparathyroidism of renal origin: Secondary | ICD-10-CM | POA: Diagnosis present

## 2021-10-04 DIAGNOSIS — Z79899 Other long term (current) drug therapy: Secondary | ICD-10-CM

## 2021-10-04 DIAGNOSIS — I498 Other specified cardiac arrhythmias: Secondary | ICD-10-CM | POA: Diagnosis present

## 2021-10-04 DIAGNOSIS — M869 Osteomyelitis, unspecified: Secondary | ICD-10-CM | POA: Diagnosis present

## 2021-10-04 DIAGNOSIS — Z7902 Long term (current) use of antithrombotics/antiplatelets: Secondary | ICD-10-CM

## 2021-10-04 DIAGNOSIS — E1152 Type 2 diabetes mellitus with diabetic peripheral angiopathy with gangrene: Principal | ICD-10-CM | POA: Diagnosis present

## 2021-10-04 DIAGNOSIS — I252 Old myocardial infarction: Secondary | ICD-10-CM | POA: Diagnosis not present

## 2021-10-04 DIAGNOSIS — E118 Type 2 diabetes mellitus with unspecified complications: Secondary | ICD-10-CM

## 2021-10-04 DIAGNOSIS — K219 Gastro-esophageal reflux disease without esophagitis: Secondary | ICD-10-CM | POA: Diagnosis present

## 2021-10-04 DIAGNOSIS — Z7982 Long term (current) use of aspirin: Secondary | ICD-10-CM | POA: Diagnosis not present

## 2021-10-04 DIAGNOSIS — E1122 Type 2 diabetes mellitus with diabetic chronic kidney disease: Secondary | ICD-10-CM | POA: Diagnosis present

## 2021-10-04 DIAGNOSIS — I5042 Chronic combined systolic (congestive) and diastolic (congestive) heart failure: Secondary | ICD-10-CM | POA: Diagnosis present

## 2021-10-04 DIAGNOSIS — M79645 Pain in left finger(s): Secondary | ICD-10-CM

## 2021-10-04 DIAGNOSIS — E1169 Type 2 diabetes mellitus with other specified complication: Secondary | ICD-10-CM | POA: Diagnosis present

## 2021-10-04 DIAGNOSIS — E785 Hyperlipidemia, unspecified: Secondary | ICD-10-CM | POA: Diagnosis present

## 2021-10-04 DIAGNOSIS — D638 Anemia in other chronic diseases classified elsewhere: Secondary | ICD-10-CM | POA: Diagnosis present

## 2021-10-04 DIAGNOSIS — M65142 Other infective (teno)synovitis, left hand: Secondary | ICD-10-CM | POA: Diagnosis not present

## 2021-10-04 LAB — LACTIC ACID, PLASMA: Lactic Acid, Venous: 1.5 mmol/L (ref 0.5–1.9)

## 2021-10-04 LAB — CBC WITH DIFFERENTIAL/PLATELET
Abs Immature Granulocytes: 0.07 10*3/uL (ref 0.00–0.07)
Basophils Absolute: 0.1 10*3/uL (ref 0.0–0.1)
Basophils Relative: 1 %
Eosinophils Absolute: 0.3 10*3/uL (ref 0.0–0.5)
Eosinophils Relative: 3 %
HCT: 31.9 % — ABNORMAL LOW (ref 39.0–52.0)
Hemoglobin: 10.2 g/dL — ABNORMAL LOW (ref 13.0–17.0)
Immature Granulocytes: 1 %
Lymphocytes Relative: 11 %
Lymphs Abs: 0.9 10*3/uL (ref 0.7–4.0)
MCH: 31 pg (ref 26.0–34.0)
MCHC: 32 g/dL (ref 30.0–36.0)
MCV: 97 fL (ref 80.0–100.0)
Monocytes Absolute: 1 10*3/uL (ref 0.1–1.0)
Monocytes Relative: 12 %
Neutro Abs: 6.1 10*3/uL (ref 1.7–7.7)
Neutrophils Relative %: 72 %
Platelets: 209 10*3/uL (ref 150–400)
RBC: 3.29 MIL/uL — ABNORMAL LOW (ref 4.22–5.81)
RDW: 17 % — ABNORMAL HIGH (ref 11.5–15.5)
WBC: 8.5 10*3/uL (ref 4.0–10.5)
nRBC: 0 % (ref 0.0–0.2)

## 2021-10-04 LAB — TROPONIN I (HIGH SENSITIVITY): Troponin I (High Sensitivity): 118 ng/L (ref <18)

## 2021-10-04 LAB — BASIC METABOLIC PANEL
Anion gap: 22 — ABNORMAL HIGH (ref 5–15)
BUN: 64 mg/dL — ABNORMAL HIGH (ref 6–20)
CO2: 24 mmol/L (ref 22–32)
Calcium: 9.1 mg/dL (ref 8.9–10.3)
Chloride: 93 mmol/L — ABNORMAL LOW (ref 98–111)
Creatinine, Ser: 9.41 mg/dL — ABNORMAL HIGH (ref 0.61–1.24)
GFR, Estimated: 6 mL/min — ABNORMAL LOW (ref >60)
Glucose, Bld: 194 mg/dL — ABNORMAL HIGH (ref 70–99)
Potassium: 5.2 mmol/L — ABNORMAL HIGH (ref 3.5–5.1)
Sodium: 139 mmol/L (ref 135–145)

## 2021-10-04 MED ORDER — GABAPENTIN 100 MG PO CAPS
100.0000 mg | ORAL_CAPSULE | Freq: Three times a day (TID) | ORAL | Status: DC
  Administered 2021-10-05 – 2021-10-06 (×4): 100 mg via ORAL
  Filled 2021-10-04 (×4): qty 1

## 2021-10-04 MED ORDER — VANCOMYCIN HCL IN DEXTROSE 1-5 GM/200ML-% IV SOLN
1000.0000 mg | INTRAVENOUS | Status: DC
  Administered 2021-10-06: 1000 mg via INTRAVENOUS
  Filled 2021-10-04: qty 200

## 2021-10-04 MED ORDER — ASPIRIN EC 81 MG PO TBEC
81.0000 mg | DELAYED_RELEASE_TABLET | Freq: Every day | ORAL | Status: DC
  Administered 2021-10-06: 81 mg via ORAL
  Filled 2021-10-04: qty 1

## 2021-10-04 MED ORDER — HEPARIN SODIUM (PORCINE) 5000 UNIT/ML IJ SOLN
5000.0000 [IU] | Freq: Three times a day (TID) | INTRAMUSCULAR | Status: DC
  Administered 2021-10-05 – 2021-10-06 (×4): 5000 [IU] via SUBCUTANEOUS
  Filled 2021-10-04 (×4): qty 1

## 2021-10-04 MED ORDER — VANCOMYCIN HCL 2000 MG/400ML IV SOLN
2000.0000 mg | Freq: Once | INTRAVENOUS | Status: AC
  Administered 2021-10-05: 2000 mg via INTRAVENOUS
  Filled 2021-10-04: qty 400

## 2021-10-04 MED ORDER — INSULIN ASPART 100 UNIT/ML IJ SOLN: 0.0000 [IU] | Freq: Every day | INTRAMUSCULAR | Status: DC

## 2021-10-04 MED ORDER — PANTOPRAZOLE SODIUM 40 MG PO TBEC
40.0000 mg | DELAYED_RELEASE_TABLET | Freq: Two times a day (BID) | ORAL | Status: DC
  Administered 2021-10-05 – 2021-10-06 (×3): 40 mg via ORAL
  Filled 2021-10-04 (×3): qty 1

## 2021-10-04 MED ORDER — ONDANSETRON HCL 4 MG/2ML IJ SOLN
4.0000 mg | Freq: Four times a day (QID) | INTRAMUSCULAR | Status: DC | PRN
  Administered 2021-10-05: 4 mg via INTRAVENOUS
  Filled 2021-10-04: qty 2

## 2021-10-04 MED ORDER — PIPERACILLIN-TAZOBACTAM 3.375 G IVPB 30 MIN
3.3750 g | Freq: Four times a day (QID) | INTRAVENOUS | Status: DC
  Administered 2021-10-04 – 2021-10-05 (×2): 3.375 g via INTRAVENOUS
  Filled 2021-10-04 (×2): qty 50

## 2021-10-04 MED ORDER — HYDROCODONE-ACETAMINOPHEN 5-325 MG PO TABS
1.0000 | ORAL_TABLET | Freq: Once | ORAL | Status: AC
  Administered 2021-10-04: 1 via ORAL
  Filled 2021-10-04: qty 1

## 2021-10-04 MED ORDER — ONDANSETRON HCL 4 MG PO TABS: 4.0000 mg | ORAL_TABLET | Freq: Four times a day (QID) | ORAL | Status: DC | PRN

## 2021-10-04 MED ORDER — HYDROMORPHONE HCL 1 MG/ML IJ SOLN
1.0000 mg | INTRAMUSCULAR | Status: DC | PRN
  Administered 2021-10-05 (×3): 1 mg via INTRAVENOUS
  Filled 2021-10-04 (×4): qty 1

## 2021-10-04 MED ORDER — SODIUM CHLORIDE 0.9% FLUSH
3.0000 mL | Freq: Two times a day (BID) | INTRAVENOUS | Status: DC
  Administered 2021-10-05 – 2021-10-06 (×2): 3 mL via INTRAVENOUS

## 2021-10-04 MED ORDER — CALCIUM GLUCONATE-NACL 1-0.675 GM/50ML-% IV SOLN
1.0000 g | Freq: Once | INTRAVENOUS | Status: AC
Start: 2021-10-04 — End: 2021-10-04
  Administered 2021-10-04: 1000 mg via INTRAVENOUS
  Filled 2021-10-04: qty 50

## 2021-10-04 MED ORDER — CHLORHEXIDINE GLUCONATE CLOTH 2 % EX PADS: 6.0000 | MEDICATED_PAD | Freq: Every day | CUTANEOUS | Status: DC

## 2021-10-04 MED ORDER — SODIUM ZIRCONIUM CYCLOSILICATE 10 G PO PACK
10.0000 g | PACK | Freq: Once | ORAL | Status: AC
  Administered 2021-10-04: 10 g via ORAL
  Filled 2021-10-04: qty 1

## 2021-10-04 MED ORDER — BISACODYL 5 MG PO TBEC
5.0000 mg | DELAYED_RELEASE_TABLET | Freq: Every day | ORAL | Status: DC | PRN
  Administered 2021-10-05: 5 mg via ORAL
  Filled 2021-10-04: qty 1

## 2021-10-04 MED ORDER — INSULIN ASPART 100 UNIT/ML IJ SOLN
0.0000 [IU] | Freq: Three times a day (TID) | INTRAMUSCULAR | Status: DC
  Administered 2021-10-06: 2 [IU] via SUBCUTANEOUS

## 2021-10-04 MED ORDER — ACETAMINOPHEN 325 MG PO TABS
650.0000 mg | ORAL_TABLET | Freq: Four times a day (QID) | ORAL | Status: DC | PRN
  Administered 2021-10-06: 650 mg via ORAL
  Filled 2021-10-04: qty 2

## 2021-10-04 MED ORDER — SUCROFERRIC OXYHYDROXIDE 500 MG PO CHEW
500.0000 mg | CHEWABLE_TABLET | Freq: Three times a day (TID) | ORAL | Status: DC
  Administered 2021-10-06: 500 mg via ORAL
  Filled 2021-10-04 (×6): qty 1

## 2021-10-04 MED ORDER — SENNOSIDES-DOCUSATE SODIUM 8.6-50 MG PO TABS: 1.0000 | ORAL_TABLET | Freq: Every evening | ORAL | Status: DC | PRN

## 2021-10-04 MED ORDER — ATORVASTATIN CALCIUM 80 MG PO TABS
80.0000 mg | ORAL_TABLET | Freq: Every day | ORAL | Status: DC
  Administered 2021-10-06: 80 mg via ORAL
  Filled 2021-10-04: qty 1

## 2021-10-04 MED ORDER — CLOPIDOGREL BISULFATE 75 MG PO TABS
75.0000 mg | ORAL_TABLET | Freq: Every day | ORAL | Status: DC
  Administered 2021-10-06: 75 mg via ORAL
  Filled 2021-10-04: qty 1

## 2021-10-04 MED ORDER — MORPHINE SULFATE (PF) 4 MG/ML IV SOLN
4.0000 mg | Freq: Once | INTRAVENOUS | Status: AC
  Administered 2021-10-04: 4 mg via INTRAVENOUS
  Filled 2021-10-04: qty 1

## 2021-10-04 MED ORDER — OXYCODONE-ACETAMINOPHEN 5-325 MG PO TABS
1.0000 | ORAL_TABLET | Freq: Once | ORAL | Status: AC
  Administered 2021-10-04: 1 via ORAL
  Filled 2021-10-04: qty 1

## 2021-10-04 MED ORDER — LINACLOTIDE 72 MCG PO CAPS
72.0000 ug | ORAL_CAPSULE | Freq: Every day | ORAL | Status: DC
  Administered 2021-10-06: 72 ug via ORAL
  Filled 2021-10-04 (×2): qty 1

## 2021-10-04 MED ORDER — ACETAMINOPHEN 650 MG RE SUPP: 650.0000 mg | Freq: Four times a day (QID) | RECTAL | Status: DC | PRN

## 2021-10-04 MED ORDER — PIPERACILLIN-TAZOBACTAM 3.375 G IVPB
3.3750 g | Freq: Four times a day (QID) | INTRAVENOUS | Status: DC
Start: 2021-10-04 — End: 2021-10-04

## 2021-10-04 MED ORDER — CEFTRIAXONE SODIUM 1 G IJ SOLR: 1.0000 g | Freq: Once | INTRAMUSCULAR | Status: DC

## 2021-10-04 MED ORDER — OXYCODONE HCL 5 MG PO TABS
5.0000 mg | ORAL_TABLET | ORAL | Status: DC | PRN
  Administered 2021-10-05: 5 mg via ORAL
  Filled 2021-10-04: qty 1

## 2021-10-04 NOTE — Assessment & Plan Note (Signed)
Reported atypical chest pain recently, planned for outpatient stress testing 10/10/2021.  Chest pain is different than his prior angina.  Troponin elevated at 118. -Cardiology following, considering dobutamine stress echo versus LHC -Continue DAPT with aspirin and Plavix -Not on optimal GDMT due to orthostatic hypotension with HD -Trend troponin -Continue atorvastatin

## 2021-10-04 NOTE — Assessment & Plan Note (Signed)
Patient with known history of Mobitz 1 AV block, EKG on arrival looks like junctional rhythm.   -Cardiology following -Lokelma given for hyperkalemia -S/p IV calcium gluconate in ED -Monitor on telemetry

## 2021-10-04 NOTE — ED Triage Notes (Signed)
Pt. Stated, Having hand pain and sores on the index and ring finger, and so painful. Sees Dr. Fredna Dow on the 23 for surgery on the ring finger. My index finger has some discharge from it. Last Dialysis was Tuesday.

## 2021-10-04 NOTE — H&P (Signed)
History and Physical    KOBEN DAMAN LAG:[id number] DOB: [birth year] DOA: 10/04/2021  PCP: [name], MD  Patient coming from: Home  I have personally briefly reviewed patient's old medical records in [place]  Chief Complaint: Left fourth finger wound infection  HPI: CASS VANDERMEULEN is a 51 y.o. male with medical history significant for ESRD on TTS HD, CAD s/p PCI with DES (left circumflex 07/2018), chronic combined systolic and diastolic CHF (EF 12-24% November 2022), severe mitral and tricuspid valve regurgitation, PVD s/p right BKA, insulin-dependent diabetes, hyperlipidemia, Hx Mobitz 1 heart block, orthostatic hypotension with dialysis who presented to the ED for evaluation of worsening left fourth finger infection/wound.  Patient has been following with hand surgery, Dr. Leanora Cover, for possible carpal tunnel or ulnar nerve compression bilaterally.  He was seen in office on 10/01/2021 where he was noted to have necrosis of the distal left ring finger.  Amputation was recommended and scheduled for 10/11/2021.  He was started on doxycycline as an outpatient.  Patient states that he has been having worsening pain at the distal left finger as well as a new pain at the MCP areas of the left third and fourth fingers.  He has seen some serosanguineous type discharge.  While in the ED, patient reported left-sided chest pain described as a burning sensation.  This felt different than his prior heart attack.  He reports significant indigestion recently and is scheduled for EGD in April.  He was also scheduled for outpatient stress testing by his cardiologist, Dr. Stanford Breed, currently planned for 10/10/2021.  Patient states he missed his dialysis today because he came to the ED early this morning.  ED Course   Labs/Imaging on admission: I have personally reviewed following labs and imaging studies.  Initial vitals showed BP 99/54, pulse 48, RR 17, temp 97.7 F, SPO2 96% on room  air.  Labs show sodium 139, potassium 5.2, bicarb 24, BUN 64, creatinine 9.41, serum glucose 194, WBC 8.5, hemoglobin 10.2, platelets 209,000, lactic acid 1.5, troponin 118.  Respiratory panel collected and pending left hand x-ray shows large soft tissue wound or ulceration involving distal tip of fourth finger with possible osteomyelitis involving the distal tuft of the fourth distal phalanx.  EDP discussed with on-call hand surgery who will consult in the hospital pending further medical optimization.  Nephrology were consulted for dialysis needs.  Cardiology were consulted and following for evaluation of junctional rhythm and preoperative cardiac assessment.  Patient was started on IV vancomycin and Zosyn, given 10 g oral Lokelma, 1 g IV calcium gluconate, 4 mg IV morphine, Norco, Percocet.  Patient threatened to leave AMA but agreed to remain in hospital.  The hospitalist service was consulted to admit for further evaluation and management.  Review of Systems: All systems reviewed and are negative except as documented in history of present illness above.   Past Medical History:  Diagnosis Date   Allergy    Anemia    getting Venofer with dialysis   Anxiety    self reported, sometimes-situational anxiety   Arthritis    bilateral hands/LEFT hip   CHF (congestive heart failure) (HCC)    Chronic kidney disease    t th s dialysis- [place] ---ESRD   Complication of anesthesia    slow to go to sleep   Coronary artery disease    Depression    self reported, sometimes-situational depression   Diabetes mellitus    type II- on meds   Dyspnea  sometimes- fluid   GERD (gastroesophageal reflux disease)    on meds   Hypertension    no longer on medication since starting on Hemodialysis   Myocardial infarction [facility]) 2019   Sleep apnea     Past Surgical History:  Procedure Laterality Date   A/V FISTULAGRAM Left 03/06/2020   Procedure: A/V FISTULAGRAM;  Surgeon: Waynetta Sandy,  MD;  Location: [place] [facility];  Service: Cardiovascular;  Laterality: Left;   ABDOMINAL AORTOGRAM W/LOWER EXTREMITY N/A 04/09/2021   Procedure: ABDOMINAL AORTOGRAM W/LOWER EXTREMITY;  Surgeon: Waynetta Sandy, MD;  Location: [place] [facility];  Service: Cardiovascular;  Laterality: N/A;   AMPUTATION Left 05/10/2016   Procedure: Left Transmetatarsal Amputation;  Surgeon: [name], MD;  Location: [place];  Service: Orthopedics;  Laterality: Left;   AMPUTATION Right 05/16/2018   Procedure: PARTIAL RAY AMPUTATION FIFTH TOE RIGHT FOOT;  Surgeon: [name], DPM;  Location: [place];  Service: Podiatry;  Laterality: Right;   AMPUTATION Right 06/17/2018   Procedure: AMPUTATION 4th RAY Right Foot;  Surgeon: Trula Slade, DPM;  Location: [place];  Service: Podiatry;  Laterality: Right;   AMPUTATION Right 06/19/2018   Procedure: RIGHT BELOW KNEE AMPUTATION;  Surgeon: [name], MD;  Location: [place];  Service: Orthopedics;  Laterality: Right;   APPLICATION OF WOUND VAC Right 06/19/2018   Procedure: APPLICATION OF WOUND VAC;  Surgeon: [name], MD;  Location: [place];  Service: Orthopedics;  Laterality: Right;   AV FISTULA PLACEMENT Left 11/25/2019   Procedure: LEFT ARM Brachiocephalic  ARTERIOVENOUS (AV) FISTULA CREATION;  Surgeon: Rosetta Posner, MD;  Location: [place];  Service: Vascular;  Laterality: Left;   [place] Left 07/19/2020   Procedure: LEFT SECOND STAGE [place];  Surgeon: Waynetta Sandy, MD;  Location: [place];  Service: Vascular;  Laterality: Left;   CARDIAC CATHETERIZATION  07/29/2018   CIRCUMCISION  09/02/2011   Procedure: CIRCUMCISION ADULT;  Surgeon: Molli Hazard, MD;  Location: [place];  Service: Urology;  Laterality: N/A;   CORONARY STENT INTERVENTION N/A 07/29/2018   Procedure: CORONARY STENT INTERVENTION;  Surgeon: Leonie Man, MD;  Location: [place] [facility];  Service:  Cardiovascular;  Laterality: N/A;   I & D EXTREMITY  09/02/2011   Procedure: IRRIGATION AND DEBRIDEMENT EXTREMITY;  Surgeon: Theodoro Kos, DO;  Location: [place];  Service: Plastics;  Laterality: N/A;   INCISION AND DRAINAGE OF WOUND  08/12/2011   Procedure: IRRIGATION AND DEBRIDEMENT WOUND;  Surgeon: Zenovia Jarred, MD;  Location: [place];  Service: General;;  perineum   INSERTION OF DIALYSIS CATHETER N/A 11/25/2019   Procedure: INSERTION OF Righ Internal Jugular DIALYSIS CATHETER;  Surgeon: Rosetta Posner, MD;  Location: [place];  Service: Vascular;  Laterality: N/A;   [place]  08/12/2011   Procedure: IRRIGATION AND DEBRIDEMENT ABSCESS;  Surgeon: Molli Hazard, MD;  Location: [place];  Service: Urology;  Laterality: N/A;  irrigation and debridment perineum     IRRIGATION AND DEBRIDEMENT ABSCESS  08/14/2011   Procedure: MINOR INCISION AND DRAINAGE OF ABSCESS;  Surgeon: Molli Hazard, MD;  Location: [place];  Service: Urology;  Laterality: N/A;  Perineal Wound Debridement;Placement Bilateral Testicular Thigh Pouches   LEFT HEART CATH AND CORONARY ANGIOGRAPHY N/A 07/29/2018   Procedure: LEFT HEART CATH AND CORONARY ANGIOGRAPHY;  Surgeon: Leonie Man, MD;  Location: [place] [facility];  Service: Cardiovascular;  Laterality: N/A;   PERIPHERAL VASCULAR ATHERECTOMY Left 04/09/2021   Procedure: PERIPHERAL VASCULAR ATHERECTOMY;  Surgeon: Waynetta Sandy, MD;  Location: [place] [facility];  Service: Cardiovascular;  Laterality: Left;  TP trunk and peroneal arteries   PERIPHERAL VASCULAR BALLOON ANGIOPLASTY Left 04/09/2021   Procedure: PERIPHERAL VASCULAR BALLOON ANGIOPLASTY;  Surgeon: Waynetta Sandy, MD;  Location: [place] [facility];  Service: Cardiovascular;  Laterality: Left;  Popliteal   REVISON OF ARTERIOVENOUS FISTULA Left 05/24/2020   Procedure: LEFT ARM ARTERIOVENOUS FISTULA  CONVERSION TO BASILIC VEIN FISTULA;  Surgeon:  Waynetta Sandy, MD;  Location: [place];  Service: Vascular;  Laterality: Left;   TOTAL HIP ARTHROPLASTY Left 10/11/2016   Procedure: LEFT TOTAL HIP ARTHROPLASTY ANTERIOR APPROACH;  Surgeon: Mcarthur Rossetti, MD;  Location: [facility];  Service: Orthopedics;  Laterality: Left;    Social History:  reports that he quit smoking about 22 months ago. His smoking use included cigarettes. He has a 6.00 pack-year smoking history. He has never used smokeless tobacco. He reports that he does not currently use alcohol. He reports that he does not use drugs.  Allergies  Allergen Reactions   Crestor [Rosuvastatin] Rash    Also had a red eye    Family History  Problem Relation Age of Onset   Diabetes Other    Pancreatic cancer Mother 38   Colon cancer Neg Hx    Colon polyps Neg Hx    Esophageal cancer Neg Hx    Rectal cancer Neg Hx    Stomach cancer Neg Hx      Prior to Admission medications   Medication Sig Start Date End Date Taking? Authorizing Provider  acetaminophen (TYLENOL) 500 MG tablet Take 500-1,000 mg by mouth every 6 (six) hours as needed for moderate pain or headache.    [provider]  allopurinol (ZYLOPRIM) 100 MG tablet Take 100 mg by mouth daily. 08/30/21   [provider]  aspirin EC 81 MG tablet Take 1 tablet (81 mg total) by mouth daily. Swallow whole. 03/14/20   Lelon Perla, MD  atorvastatin (LIPITOR) 80 MG tablet Take 1 tablet (80 mg total) by mouth daily. 09/21/21 12/20/21  Lelon Perla, MD  bismuth subsalicylate (PEPTO BISMOL) 262 MG/15ML suspension Take 30 mLs by mouth every 6 (six) hours as needed for indigestion.    [provider]  blood glucose meter kit and supplies Dispense based on patient and insurance preference. Use up to four times daily as directed. (FOR ICD-9 250.00, 250.01). 07/01/18   Angiulli, Lavon Paganini, PA-C  cephALEXin (KEFLEX) 500 MG capsule Take 500 mg by mouth 4 (four) times daily. 09/07/21   [provider]  clopidogrel (PLAVIX) 75 MG tablet Take 1 tablet (75 mg total) by mouth daily. 04/09/21 04/09/22  Waynetta Sandy, MD  doxycycline (VIBRAMYCIN) 100 MG capsule Take 1 capsule (100 mg total) by mouth 2 (two) times daily. 09/22/21   Lynden Oxford Scales, PA-C  gabapentin (NEURONTIN) 100 MG capsule Take 100 mg by mouth 3 (three) times daily. 08/09/21   [provider]  ibuprofen (ADVIL) 200 MG tablet Take 600 mg by mouth every 6 (six) hours as needed for headache or moderate pain.    [provider]  insulin aspart protamine - aspart (NOVOLOG MIX 70/30 FLEXPEN) (70-30) 100 UNIT/ML FlexPen Inject 0.3 mLs (30 Units total) into the skin 2 (two) times daily with a meal. Patient taking differently: Inject 30 Units into the skin with breakfast, with  lunch, and with evening meal. 09/29/20   Lavina Hamman, MD  Insulin Pen Needle 31G X 5 MM MISC Use daily to inject insulin as instructed 07/01/18   Angiulli, Lavon Paganini, PA-C  lidocaine-prilocaine (EMLA) cream Apply 1 application topically See admin instructions. Apply a small amount to access site (AVF) 1-2 hours before dialysis. Cover with occlusive dressing (saran wrap). 03/22/21   [provider]  linaclotide Rolan Lipa) 145 MCG CAPS capsule Take 1 capsule (145 mcg total) by mouth daily before breakfast. Patient not taking: Reported on 09/21/2021 12/13/20   Yetta Flock, MD  linaclotide [facility]) 72 MCG capsule Take 1 capsule (72 mcg total) by mouth daily before breakfast. 12/13/20   Armbruster, Carlota Raspberry, MD  pantoprazole (PROTONIX) 40 MG tablet Take 1 tablet (40 mg total) by mouth in the morning and at bedtime. 08/29/21   Willia Craze, NP  polyethylene glycol (MIRALAX) 17 g packet Take 17 g by mouth daily. Patient not taking: Reported on 09/21/2021 12/13/20   Yetta Flock, MD  predniSONE (DELTASONE) 20 MG tablet Take 1 tablet by mouth in the morning and at bedtime. Patient not taking: Reported on  09/21/2021 08/14/21   [provider]  triamcinolone ointment (KENALOG) 0.1 % Apply a small amount to affected area twice a day 09/04/21   [provider]  VELPHORO 500 MG chewable tablet Chew 500 mg by mouth 3 (three) times daily with meals.  06/14/20   [provider]    Physical Exam: Vitals:   10/04/21 0955 10/04/21 1343 10/04/21 1706 10/04/21 1852  BP: (!) 116/94 (!) 95/54 115/82 113/64  Pulse: (!) 49 (!) 48 (!) 57 (!) 54  Resp: 16 17 20 16   Temp:  97.7 F (36.5 C)    TempSrc:  Oral    SpO2: 97% 96% 97% 96%   Constitutional: Sitting up in bed, NAD, calm, comfortable Eyes: PERRL, lids and conjunctivae normal ENMT: Mucous membranes are moist. Posterior pharynx clear of any exudate or lesions.Normal dentition.  Neck: normal, supple, no masses. Respiratory: clear to auscultation bilaterally, no wheezing, no crackles. Normal respiratory effort. No accessory muscle use.  Cardiovascular: Regular rate and rhythm, flow murmur present.  Tight right lower extremity edema.  LUE aVF with palpable thrill. Abdomen: no tenderness, no masses palpated. No hepatosplenomegaly. Bowel sounds positive.  Musculoskeletal: S/p right BKA, no clubbing / cyanosis. Good ROM, no contractures.  Skin: Swelling to left ring finger with dried gangrene changes to distal tip, tenderness at the MCP area dorsum of left hand Neurologic: CN 2-12 grossly intact.  Strength intact. Psychiatric:  Alert and oriented x 3. Normal mood.     EKG: Personally reviewed. Junctional rhythm, rate 62.  Prior EKG showed Mobitz 1 AV block.  Assessment/Plan Principal Problem:   Osteomyelitis of finger of left hand (HCC) Active Problems:   Hyperkalemia   Chronic combined systolic and diastolic CHF (congestive heart failure) (HCC)   CAD S/P percutaneous coronary angioplasty   Junctional rhythm   ESRD on hemodialysis ([place])   Diabetes mellitus with complication, with long-term current use of insulin ([place])    Anemia of chronic disease   DAROL CUSH is a 51 y.o. male with medical history significant for ESRD on TTS HD, CAD s/p PCI with DES (left circumflex 07/2018), chronic combined systolic and diastolic CHF (EF 16-10% November 2022), severe mitral and tricuspid valve regurgitation, PVD s/p right BKA, insulin-dependent diabetes, hyperlipidemia, Hx Mobitz 1 heart block, orthostatic hypotension with dialysis who is admitted  with osteomyelitis of the left fourth finger, chest pain, and hyperkalemia.  Assessment and Plan: * Osteomyelitis of finger of left hand ([place])- (present on admission) -Continue empiric IV vancomycin and Zosyn -Follows with hand surgery, Dr. Leanora Cover; case discussed with covering provider Dr. Daryll Brod who recommended formal consult in a.m. -Analgesics as needed  Hyperkalemia- (present on admission) Potassium slightly elevated at 5.2, missed HD earlier today since he came to the hospital early in the morning.  Given Lokelma 10 g orally.  Nephrology following for dialysis needs.  Repeat labs in AM.  Junctional rhythm- (present on admission) Patient with known history of Mobitz 1 AV block, EKG on arrival looks like junctional rhythm.   -Cardiology following -Lokelma given for hyperkalemia -S/p IV calcium gluconate in ED -Monitor on telemetry  CAD S/P percutaneous coronary angioplasty Reported atypical chest pain recently, planned for outpatient stress testing 10/10/2021.  Chest pain is different than his prior angina.  Troponin elevated at 118. -Cardiology following, considering dobutamine stress echo versus LHC -Continue DAPT with aspirin and Plavix -Not on optimal GDMT due to orthostatic hypotension with HD -Trend troponin -Continue atorvastatin  Chronic combined systolic and diastolic CHF (congestive heart failure) ([place])- (present on admission) Last EF 40-45% November 2022 at [facility].  Compensated on admission.  Volume control with dialysis.  ESRD on hemodialysis  [facility]) Nephrology following for HD needs.  Last HD 2/14 per patient.  Diabetes mellitus with complication, with long-term current use of insulin ([place]) Place on SSI.  Anemia of chronic disease- (present on admission) Currently stable, continue to monitor.  DVT prophylaxis: heparin injection 5,000 Units Start: 10/04/21 2200 Code Status:   Code Status: Full Code Family Communication: Discussed with patient, he has discussed with family Disposition Plan: From home, dispo pending clinical progress Consults called: Nephrology, cardiology, hand surgery Severity of Illness: The appropriate patient status for this patient is INPATIENT. Inpatient status is judged to be reasonable and necessary in order to provide the required intensity of service to ensure the patient's safety. The patient's presenting symptoms, physical exam findings, and initial radiographic and laboratory data in the context of their chronic comorbidities is felt to place them at high risk for further clinical deterioration. Furthermore, it is not anticipated that the patient will be medically stable for discharge from the hospital within 2 midnights of admission.   * I certify that at the point of admission it is my clinical judgment that the patient will require inpatient hospital care spanning beyond 2 midnights from the point of admission due to high intensity of service, high risk for further deterioration and high frequency of surveillance required.Zada Finders MD [facility]  If 7PM-7AM, please contact night-coverage [email or web address]  10/04/2021, 10:55 PM

## 2021-10-04 NOTE — Consult Note (Signed)
Cardiology Consult Note:   Patient ID: DAGO JUNGWIRTH MRN: [id number]; DOB: 28-Dec-1970   Admission date: 10/04/2021  PCP:  [name], MD   [facility] Providers Cardiologist:  Kirk Ruths, MD        Chief Complaint:  Bradycardia  Patient Profile:   Edward [name] is a 51 y.o. male with CAD with prior PCI (LCX, OM, LAD) HFrEF 40-45%, mild AI, Severe MR and TR, Prior TIA, IDDM, ESRD, PAD with prior BKA, hx of Mobitz I HB who is being seen 10/04/2021 for the evaluation of junctional heart rhythm.  History of Present Illness:   Patient initially presented with left finger pain, had plans for hand surgery but worsening pain lead to ED eval.  He has been here for some time because of business of all heart care systems.  Was planned for NM Stress Test 10/10/21, had missed that call. Was upset in the ED for the wait time. Had fight with his son in the hallway.  This lead to him having a return of the CP he had (last was prior 09/21/21 as he noted it with Dr. Stanford Breed) Had CP that resolved with Norco Had K 5.2 given Lokelma No SON, NO syncope no near syncope He is no on no AV nodal agents.  EKG has artifact but appears to have some junctional beats At baseline he has Mobtiz Type I HB.  Cardiology called to assist with his care.   Past Medical History:  Diagnosis Date   Allergy    Anemia    getting Venofer with dialysis   Anxiety    self reported, sometimes-situational anxiety   Arthritis    bilateral hands/LEFT hip   CHF (congestive heart failure) (HCC)    Chronic kidney disease    t th s dialysis- [place] ---ESRD   Complication of anesthesia    slow to go to sleep   Coronary artery disease    Depression    self reported, sometimes-situational depression   Diabetes mellitus    type II- on meds   Dyspnea    sometimes- fluid   GERD (gastroesophageal reflux disease)    on meds   Hypertension    no longer on medication since starting on Hemodialysis   Myocardial  infarction [facility]) 2019   Sleep apnea     Past Surgical History:  Procedure Laterality Date   A/V FISTULAGRAM Left 03/06/2020   Procedure: A/V FISTULAGRAM;  Surgeon: Waynetta Sandy, MD;  Location: [place] [facility];  Service: Cardiovascular;  Laterality: Left;   ABDOMINAL AORTOGRAM W/LOWER EXTREMITY N/A 04/09/2021   Procedure: ABDOMINAL AORTOGRAM W/LOWER EXTREMITY;  Surgeon: Waynetta Sandy, MD;  Location: [place] [facility];  Service: Cardiovascular;  Laterality: N/A;   AMPUTATION Left 05/10/2016   Procedure: Left Transmetatarsal Amputation;  Surgeon: [name], MD;  Location: [place];  Service: Orthopedics;  Laterality: Left;   AMPUTATION Right 05/16/2018   Procedure: PARTIAL RAY AMPUTATION FIFTH TOE RIGHT FOOT;  Surgeon: [name], DPM;  Location: [place];  Service: Podiatry;  Laterality: Right;   AMPUTATION Right 06/17/2018   Procedure: AMPUTATION 4th RAY Right Foot;  Surgeon: Trula Slade, DPM;  Location: [place];  Service: Podiatry;  Laterality: Right;   AMPUTATION Right 06/19/2018   Procedure: RIGHT BELOW KNEE AMPUTATION;  Surgeon: [name], MD;  Location: [place];  Service: Orthopedics;  Laterality: Right;   APPLICATION OF WOUND VAC Right 06/19/2018   Procedure: APPLICATION OF WOUND VAC;  Surgeon: [name], MD;  Location: [place];  Service: Orthopedics;  Laterality: Right;   AV FISTULA PLACEMENT Left 11/25/2019   Procedure: LEFT ARM Brachiocephalic  ARTERIOVENOUS (AV) FISTULA CREATION;  Surgeon: Rosetta Posner, MD;  Location: [place];  Service: Vascular;  Laterality: Left;   [place] Left 07/19/2020   Procedure: LEFT SECOND STAGE [place];  Surgeon: Waynetta Sandy, MD;  Location: [place];  Service: Vascular;  Laterality: Left;   CARDIAC CATHETERIZATION  07/29/2018   CIRCUMCISION  09/02/2011   Procedure: CIRCUMCISION ADULT;  Surgeon: Molli Hazard, MD;  Location: [place];  Service:  Urology;  Laterality: N/A;   CORONARY STENT INTERVENTION N/A 07/29/2018   Procedure: CORONARY STENT INTERVENTION;  Surgeon: Leonie Man, MD;  Location: [place] [facility];  Service: Cardiovascular;  Laterality: N/A;   I & D EXTREMITY  09/02/2011   Procedure: IRRIGATION AND DEBRIDEMENT EXTREMITY;  Surgeon: Theodoro Kos, DO;  Location: [place];  Service: Plastics;  Laterality: N/A;   INCISION AND DRAINAGE OF WOUND  08/12/2011   Procedure: IRRIGATION AND DEBRIDEMENT WOUND;  Surgeon: Zenovia Jarred, MD;  Location: [place];  Service: General;;  perineum   INSERTION OF DIALYSIS CATHETER N/A 11/25/2019   Procedure: INSERTION OF Righ Internal Jugular DIALYSIS CATHETER;  Surgeon: Rosetta Posner, MD;  Location: [place];  Service: Vascular;  Laterality: N/A;   [place]  08/12/2011   Procedure: IRRIGATION AND DEBRIDEMENT ABSCESS;  Surgeon: Molli Hazard, MD;  Location: [place];  Service: Urology;  Laterality: N/A;  irrigation and debridment perineum     IRRIGATION AND DEBRIDEMENT ABSCESS  08/14/2011   Procedure: MINOR INCISION AND DRAINAGE OF ABSCESS;  Surgeon: Molli Hazard, MD;  Location: [place];  Service: Urology;  Laterality: N/A;  Perineal Wound Debridement;Placement Bilateral Testicular Thigh Pouches   LEFT HEART CATH AND CORONARY ANGIOGRAPHY N/A 07/29/2018   Procedure: LEFT HEART CATH AND CORONARY ANGIOGRAPHY;  Surgeon: Leonie Man, MD;  Location: [place] [facility];  Service: Cardiovascular;  Laterality: N/A;   PERIPHERAL VASCULAR ATHERECTOMY Left 04/09/2021   Procedure: PERIPHERAL VASCULAR ATHERECTOMY;  Surgeon: Waynetta Sandy, MD;  Location: [place] [facility];  Service: Cardiovascular;  Laterality: Left;  TP trunk and peroneal arteries   PERIPHERAL VASCULAR BALLOON ANGIOPLASTY Left 04/09/2021   Procedure: PERIPHERAL VASCULAR BALLOON ANGIOPLASTY;  Surgeon: Waynetta Sandy, MD;  Location: [place] [facility];  Service:  Cardiovascular;  Laterality: Left;  Popliteal   REVISON OF ARTERIOVENOUS FISTULA Left 05/24/2020   Procedure: LEFT ARM ARTERIOVENOUS FISTULA  CONVERSION TO BASILIC VEIN FISTULA;  Surgeon: Waynetta Sandy, MD;  Location: [place];  Service: Vascular;  Laterality: Left;   TOTAL HIP ARTHROPLASTY Left 10/11/2016   Procedure: LEFT TOTAL HIP ARTHROPLASTY ANTERIOR APPROACH;  Surgeon: Mcarthur Rossetti, MD;  Location: [facility];  Service: Orthopedics;  Laterality: Left;     Medications Prior to Admission: Prior to Admission medications   Medication Sig Start Date End Date Taking? Authorizing Provider  acetaminophen (TYLENOL) 500 MG tablet Take 500-1,000 mg by mouth every 6 (six) hours as needed for moderate pain or headache.    [provider]  allopurinol (ZYLOPRIM) 100 MG tablet Take 100 mg by mouth daily. 08/30/21   [provider]  aspirin EC 81 MG tablet Take 1 tablet (81 mg total) by mouth daily. Swallow whole. 03/14/20   Lelon Perla, MD  atorvastatin (LIPITOR) 80 MG  tablet Take 1 tablet (80 mg total) by mouth daily. 09/21/21 12/20/21  Lelon Perla, MD  bismuth subsalicylate (PEPTO BISMOL) 262 MG/15ML suspension Take 30 mLs by mouth every 6 (six) hours as needed for indigestion.    [provider]  blood glucose meter kit and supplies Dispense based on patient and insurance preference. Use up to four times daily as directed. (FOR ICD-9 250.00, 250.01). 07/01/18   Angiulli, Lavon Paganini, PA-C  cephALEXin (KEFLEX) 500 MG capsule Take 500 mg by mouth 4 (four) times daily. 09/07/21   [provider]  clopidogrel (PLAVIX) 75 MG tablet Take 1 tablet (75 mg total) by mouth daily. 04/09/21 04/09/22  Waynetta Sandy, MD  doxycycline (VIBRAMYCIN) 100 MG capsule Take 1 capsule (100 mg total) by mouth 2 (two) times daily. 09/22/21   Lynden Oxford Scales, PA-C  gabapentin (NEURONTIN) 100 MG capsule Take 100 mg by mouth 3 (three) times daily. 08/09/21   [provider]  ibuprofen (ADVIL) 200 MG tablet Take 600 mg by mouth every 6 (six) hours as needed for headache or moderate pain.    [provider]  insulin aspart protamine - aspart (NOVOLOG MIX 70/30 FLEXPEN) (70-30) 100 UNIT/ML FlexPen Inject 0.3 mLs (30 Units total) into the skin 2 (two) times daily with a meal. Patient taking differently: Inject 30 Units into the skin with breakfast, with lunch, and with evening meal. 09/29/20   Lavina Hamman, MD  Insulin Pen Needle 31G X 5 MM MISC Use daily to inject insulin as instructed 07/01/18   Angiulli, Lavon Paganini, PA-C  lidocaine-prilocaine (EMLA) cream Apply 1 application topically See admin instructions. Apply a small amount to access site (AVF) 1-2 hours before dialysis. Cover with occlusive dressing (saran wrap). 03/22/21   [provider]  linaclotide Rolan Lipa) 145 MCG CAPS capsule Take 1 capsule (145 mcg total) by mouth daily before breakfast. Patient not taking: Reported on 09/21/2021 12/13/20   Yetta Flock, MD  linaclotide [facility]) 72 MCG capsule Take 1 capsule (72 mcg total) by mouth daily before breakfast. 12/13/20   Armbruster, Carlota Raspberry, MD  pantoprazole (PROTONIX) 40 MG tablet Take 1 tablet (40 mg total) by mouth in the morning and at bedtime. 08/29/21   Willia Craze, NP  polyethylene glycol (MIRALAX) 17 g packet Take 17 g by mouth daily. Patient not taking: Reported on 09/21/2021 12/13/20   Yetta Flock, MD  predniSONE (DELTASONE) 20 MG tablet Take 1 tablet by mouth in the morning and at bedtime. Patient not taking: Reported on 09/21/2021 08/14/21   [provider]  triamcinolone ointment (KENALOG) 0.1 % Apply a small amount to affected area twice a day 09/04/21   [provider]  VELPHORO 500 MG chewable tablet Chew 500 mg by mouth 3 (three) times daily with meals.  06/14/20   [provider]     Allergies:    Allergies  Allergen Reactions   Crestor [Rosuvastatin] Rash    Also had  a red eye    Social History:   Social History   Socioeconomic History   Marital status: Widowed    Spouse name: Not on file   Number of children: Not on file   Years of education: Not on file   Highest education level: Not on file  Occupational History   Occupation: Disabled  Tobacco Use   Smoking status: Former    Packs/day: 0.25    Years: 24.00    Pack years: 6.00  Types: Cigarettes    Quit date: 11/19/2019    Years since quitting: 1.8   Smokeless tobacco: Never  Vaping Use   Vaping Use: Never used  Substance and Sexual Activity   Alcohol use: Not Currently    Alcohol/week: 0.0 standard drinks    Comment: quit 2018   Drug use: No   Sexual activity: Not Currently    Birth control/protection: Condom  Other Topics Concern   Not on file  Social History Narrative   Lives w/ his 2 sons.   Social Determinants of Health   Financial Resource Strain: Not on file  Food Insecurity: Not on file  Transportation Needs: Not on file  Physical Activity: Not on file  Stress: Not on file  Social Connections: Not on file  Intimate Partner Violence: Not on file    Family History:   The patient's family history includes Diabetes in an other family member; Pancreatic cancer (age of onset: 41) in his mother. There is no history of Colon cancer, Colon polyps, Esophageal cancer, Rectal cancer, or Stomach cancer.    ROS:  Please see the history of present illness.  All other ROS reviewed and negative.     Physical Exam/Data:   Vitals:   10/04/21 0955 10/04/21 1343 10/04/21 1706 10/04/21 1852  BP: (!) 116/94 (!) 95/54 115/82 113/64  Pulse: (!) 49 (!) 48 (!) 57 (!) 54  Resp: _0 Temp:  97.7 F (36.5 C)    TempSrc:  Oral    SpO2: 97% 96% 97% 96%   No intake or output data in the 24 hours ending 10/04/21 2047 Last 3 Weights 09/21/2021 08/29/2021 04/09/2021  Weight (lbs) 233 lb 12.8 oz 227 lb 245 lb  Weight (kg) 106.051 kg 102.967 kg 111.131 kg     There is no height or  weight on file to calculate BMI.   Gen: No distress   Neck: No JVD  Cardiac: No Rubs or Gallops, holosystolic murmur, regular rate; distant heart sounds Respiratory: Clear to auscultation bilaterally, normal effort, normal  respiratory rate GI: Soft, nontender, non-distended  MS: No  edema R BKA stump site is not draining Integument: Skin feels warm, his left hand has been redressed and is non purulent Neuro:  At time of evaluation, alert and oriented to person/place/time/situation  Psych: Normal affect, patient feels tired   EKG:  The ECG that was done  was personally reviewed and demonstrates baseline artifact, there is junctional heart beat  Relevant CV Studies: HFrEF EF 40% MR and TR ([facility] images only)  Laboratory Data:  High Sensitivity Troponin:   Recent Labs  Lab 09/06/21 0353 09/06/21 0623  TROPONINIHS 84* 74*      Chemistry Recent Labs  Lab 10/04/21 0726  NA 139  K 5.2*  CL 93*  CO2 24  GLUCOSE 194*  BUN 64*  CREATININE 9.41*  CALCIUM 9.1  GFRNONAA 6*  ANIONGAP 22*    No results for input(s): PROT, ALBUMIN, AST, ALT, ALKPHOS, BILITOT in the last 168 hours. Lipids No results for input(s): CHOL, TRIG, HDL, LABVLDL, LDLCALC, CHOLHDL in the last 168 hours. Hematology Recent Labs  Lab 10/04/21 0726  WBC 8.5  RBC 3.29*  HGB 10.2*  HCT 31.9*  MCV 97.0  MCH 31.0  MCHC 32.0  RDW 17.0*  PLT 209   Thyroid No results for input(s): TSH, FREET4 in the last 168 hours. BNPNo results for input(s): BNP, PROBNP in the last 168 hours.  DDimer No results for  input(s): DDIMER in the last 168 hours.   Radiology/Studies:  DG Hand Complete Left  Result Date: 10/04/2021 CLINICAL DATA:  Right finger pain and swelling. EXAM: LEFT HAND - COMPLETE 3+ VIEW COMPARISON:  None. FINDINGS: There is no evidence of fracture or dislocation. There is no evidence of arthropathy. Small defect is seen involving the distal tip of the fourth distal phalanx suggesting possible  osteomyelitis. Vascular calcifications are noted. Large soft tissue wound or ulceration is seen involving distal tip of fourth finger. IMPRESSION: Large soft tissue wound or ulceration is seen involving distal tip of fourth finger. Small defect is seen involving the distal tuft of the fourth distal phalanx suggesting possible osteomyelitis. Electronically Signed   By: Marijo Conception M.D.   On: 10/04/2021 10:17    A/P  CAD with prior PC HF EF 40% with MR and Tr Prior TI IDDM ESRD Hyperkalemia Need for hang surgery  He is planned for time sensitive surgery 10/05/21  Junctional rhythm in the setting of elevated K; has baseline conduction disease - repeating EKG; no BB, no pacing indication - needs telemetry when out of hallway   Preoperative Risk Assessment He has CAD, CHF,  prior TIA, DM on insulin, Scr >2 putting him at high risk surgical category for all procedures - In discussion with Dr. Loletha Grayer he is planned for time sensitive hand surgery, I have discussed with patient that he is at high risk of post of MI but that we have limited options for mitigation (in the setting of conduction disease, lexi scan is sub-optimal, he cannot walk, he prior stent size of 2.5 mm precluded CCTA, and his conduction disease and  orthostatic hypotension with HD limits medical management - it is reassuring that his chest pain feels much different that his prior angina - if his surgery is changed to elective dobutamine stress echo would be worth of consideration vs LHC. - HF is well compensated - continue DAPT  Patient may be leaving AMA, but if he stays we will continue to follow  For questions or updates, please contact [place] [facility] Please consult [email or web address] for contact info under     Signed, Werner Lean, MD  10/04/2021 8:47 PM

## 2021-10-04 NOTE — Assessment & Plan Note (Signed)
Potassium slightly elevated at 5.2, missed HD earlier today since he came to the hospital early in the morning.  Given Lokelma 10 g orally.  Nephrology following for dialysis needs.  Repeat labs in AM.

## 2021-10-04 NOTE — Assessment & Plan Note (Signed)
Last EF 40-45% November 2022 at [facility].  Compensated on admission.  Volume control with dialysis.

## 2021-10-04 NOTE — Assessment & Plan Note (Signed)
Nephrology following for HD needs.  Last HD 2/14 per patient.

## 2021-10-04 NOTE — Assessment & Plan Note (Signed)
Currently stable, continue to monitor

## 2021-10-04 NOTE — Telephone Encounter (Signed)
Left message on voicemail per DPR in reference to upcoming appointment scheduled on  10/10/21 with detailed instructions given per Myocardial Perfusion Study Information Sheet for the test. LM to arrive 15 minutes early, and that it is imperative to arrive on time for appointment to keep from having the test rescheduled. If you need to cancel or reschedule your appointment, please call the office within 24 hours of your appointment. Failure to do so may result in a cancellation of your appointment, and a $50 no show fee. Phone number given for call back for any questions. Kirstie Peri

## 2021-10-04 NOTE — Hospital Course (Signed)
Edward [name] is a 51 y.o. male with medical history significant for ESRD on TTS HD, CAD s/p PCI with DES (left circumflex 07/2018), chronic combined systolic and diastolic CHF (EF 32-41% November 2022), severe mitral and tricuspid valve regurgitation, PVD s/p right BKA, insulin-dependent diabetes, hyperlipidemia, Hx Mobitz 1 heart block, orthostatic hypotension with dialysis who is admitted with osteomyelitis of the left fourth finger, chest pain, and hyperkalemia.

## 2021-10-04 NOTE — Assessment & Plan Note (Signed)
Place on SSI. °

## 2021-10-04 NOTE — Progress Notes (Signed)
Pharmacy Antibiotic Note  Edward [name] is a 51 y.o. male admitted on 10/04/2021 with  osteomyelitis .  Pharmacy has been consulted for Zosyn and vancomycin dosing. WBC wnl, LA wnl.   Of note patient has h/o ESRD on T/Th/Sat  Plan: -Zosyn 3.375 gm IV Q 6 hours  -Vancomycin 2 gm IV load followed by vancomycin 1 gm IV Q HD.  -Monitor CBC, cultures and clinical progress -Vanc levels as indicated      Temp (24hrs), Avg:97.8 F (36.6 C), Min:97.7 F (36.5 C), Max:97.9 F (36.6 C)  Recent Labs  Lab 10/04/21 0726  WBC 8.5  CREATININE 9.41*  LATICACIDVEN 1.5    Estimated Creatinine Clearance: 11.9 mL/min (A) (by C-G formula based on SCr of 9.41 mg/dL (H)).    Allergies  Allergen Reactions   Crestor [Rosuvastatin] Rash    Also had a red eye    Antimicrobials this admission: Zosyn 2/16 >>  Vancomycin 2/16 >>   Dose adjustments this admission:   Microbiology results:   Thank you for allowing pharmacy to be a part of this patients care.  Albertina Parr, PharmD., BCCCP Clinical Pharmacist Please refer to [facility] for unit-specific pharmacist

## 2021-10-04 NOTE — ED Provider Notes (Signed)
[facility] EMERGENCY DEPARTMENT Provider Note   CSN: [id number] Arrival date & time: 10/04/21  0737     History  Chief Complaint  Patient presents with   Hand Pain   Hand Problem    Edward [name] is a 51 y.o. male.  Ongoing pain from left ring finger, had some drainage from left index finger the other day and started on doxycyline. Has osteo of left ring finger and due for amputation next week. Worsening pain today. Improved with narcotics in triage. No fever.   The history is provided by the patient.  Hand Pain This is a chronic problem. The problem occurs daily. The problem has not changed since onset.Pertinent negatives include no chest pain, no abdominal pain, no headaches and no shortness of breath.      Home Medications Prior to Admission medications   Medication Sig Start Date End Date Taking? Authorizing Provider  acetaminophen (TYLENOL) 500 MG tablet Take 500-1,000 mg by mouth every 6 (six) hours as needed for moderate pain or headache.    [provider]  allopurinol (ZYLOPRIM) 100 MG tablet Take 100 mg by mouth daily. 08/30/21   [provider]  aspirin EC 81 MG tablet Take 1 tablet (81 mg total) by mouth daily. Swallow whole. 03/14/20   Lelon Perla, MD  atorvastatin (LIPITOR) 80 MG tablet Take 1 tablet (80 mg total) by mouth daily. 09/21/21 12/20/21  Lelon Perla, MD  bismuth subsalicylate (PEPTO BISMOL) 262 MG/15ML suspension Take 30 mLs by mouth every 6 (six) hours as needed for indigestion.    [provider]  blood glucose meter kit and supplies Dispense based on patient and insurance preference. Use up to four times daily as directed. (FOR ICD-9 250.00, 250.01). 07/01/18   Angiulli, Lavon Paganini, PA-C  cephALEXin (KEFLEX) 500 MG capsule Take 500 mg by mouth 4 (four) times daily. 09/07/21   [provider]  clopidogrel (PLAVIX) 75 MG tablet Take 1 tablet (75 mg total) by mouth daily. 04/09/21 04/09/22  Waynetta Sandy, MD  doxycycline (VIBRAMYCIN) 100 MG capsule Take 1 capsule (100 mg total) by mouth 2 (two) times daily. 09/22/21   Lynden Oxford Scales, PA-C  gabapentin (NEURONTIN) 100 MG capsule Take 100 mg by mouth 3 (three) times daily. 08/09/21   [provider]  ibuprofen (ADVIL) 200 MG tablet Take 600 mg by mouth every 6 (six) hours as needed for headache or moderate pain.    [provider]  insulin aspart protamine - aspart (NOVOLOG MIX 70/30 FLEXPEN) (70-30) 100 UNIT/ML FlexPen Inject 0.3 mLs (30 Units total) into the skin 2 (two) times daily with a meal. Patient taking differently: Inject 30 Units into the skin with breakfast, with lunch, and with evening meal. 09/29/20   Lavina Hamman, MD  Insulin Pen Needle 31G X 5 MM MISC Use daily to inject insulin as instructed 07/01/18   Angiulli, Lavon Paganini, PA-C  lidocaine-prilocaine (EMLA) cream Apply 1 application topically See admin instructions. Apply a small amount to access site (AVF) 1-2 hours before dialysis. Cover with occlusive dressing (saran wrap). 03/22/21   [provider]  linaclotide Rolan Lipa) 145 MCG CAPS capsule Take 1 capsule (145 mcg total) by mouth daily before breakfast. Patient not taking: Reported on 09/21/2021 12/13/20   Yetta Flock, MD  linaclotide [facility]) 72 MCG capsule Take 1 capsule (72 mcg total) by mouth daily before breakfast. 12/13/20   Armbruster, Carlota Raspberry, MD  pantoprazole (PROTONIX) 40 MG  tablet Take 1 tablet (40 mg total) by mouth in the morning and at bedtime. 08/29/21   Willia Craze, NP  polyethylene glycol (MIRALAX) 17 g packet Take 17 g by mouth daily. Patient not taking: Reported on 09/21/2021 12/13/20   Yetta Flock, MD  predniSONE (DELTASONE) 20 MG tablet Take 1 tablet by mouth in the morning and at bedtime. Patient not taking: Reported on 09/21/2021 08/14/21   [provider]  triamcinolone ointment (KENALOG) 0.1 % Apply a small amount to affected area  twice a day 09/04/21   [provider]  VELPHORO 500 MG chewable tablet Chew 500 mg by mouth 3 (three) times daily with meals.  06/14/20   [provider]      Allergies    Crestor [rosuvastatin]    Review of Systems   Review of Systems  Respiratory:  Negative for shortness of breath.   Cardiovascular:  Negative for chest pain.  Gastrointestinal:  Negative for abdominal pain.  Neurological:  Negative for headaches.   Physical Exam Updated Vital Signs BP 113/64 (BP Location: Right Arm)    Pulse (!) 54    Temp 97.7 F (36.5 C) (Oral)    Resp 16    SpO2 96%  Physical Exam Vitals and nursing note reviewed.  Constitutional:      General: He is not in acute distress.    Appearance: He is well-developed.  HENT:     Head: Normocephalic and atraumatic.  Eyes:     Extraocular Movements: Extraocular movements intact.     Conjunctiva/sclera: Conjunctivae normal.     Pupils: Pupils are equal, round, and reactive to light.  Cardiovascular:     Rate and Rhythm: Normal rate and regular rhythm.     Pulses: Normal pulses.     Heart sounds: No murmur heard. Pulmonary:     Effort: Pulmonary effort is normal. No respiratory distress.     Breath sounds: Normal breath sounds.  Abdominal:     Palpations: Abdomen is soft.     Tenderness: There is no abdominal tenderness.  Musculoskeletal:        General: Tenderness present. No swelling.     Cervical back: Neck supple.  Skin:    General: Skin is warm and dry.     Capillary Refill: Capillary refill takes less than 2 seconds.     Findings: No erythema.     Comments: Necrosis/dry gangrene changes to left ring finger at the distal tip, left index finger overall well appearing, left thumb with some chronic wound at distal tip, swelling to the dorsum of the left hand  Neurological:     General: No focal deficit present.     Mental Status: He is alert.  Psychiatric:        Mood and Affect: Mood normal.    ED Results / Procedures  / Treatments   Labs (all labs ordered are listed, but only abnormal results are displayed) Labs Reviewed  CBC WITH DIFFERENTIAL/PLATELET - Abnormal; Notable for the following components:      Result Value   RBC 3.29 (*)    Hemoglobin 10.2 (*)    HCT 31.9 (*)    RDW 17.0 (*)    All other components within normal limits  BASIC METABOLIC PANEL - Abnormal; Notable for the following components:   Potassium 5.2 (*)    Chloride 93 (*)    Glucose, Bld 194 (*)    BUN 64 (*)    Creatinine, Ser 9.41 (*)  GFR, Estimated 6 (*)    Anion gap 22 (*)    All other components within normal limits  RESP PANEL BY RT-PCR (FLU A&B, COVID) ARPGX2  LACTIC ACID, PLASMA  TROPONIN I (HIGH SENSITIVITY)    EKG EKG Interpretation  Date/Time:  Thursday October 04 2021 18:56:49 EST Ventricular Rate:  65 PR Interval:    QRS Duration: 110 QT Interval:  454 QTC Calculation: 472 R Axis:   153 Text Interpretation: Juntional rhythmn Abnormal ECG When compared with ECG of 04-Oct-2021 18:56, PREVIOUS ECG IS PRESENT Confirmed by Lennice Sites ([id number]) on 10/04/2021 8:48:43 PM  Radiology DG Hand Complete Left  Result Date: 10/04/2021 CLINICAL DATA:  Right finger pain and swelling. EXAM: LEFT HAND - COMPLETE 3+ VIEW COMPARISON:  None. FINDINGS: There is no evidence of fracture or dislocation. There is no evidence of arthropathy. Small defect is seen involving the distal tip of the fourth distal phalanx suggesting possible osteomyelitis. Vascular calcifications are noted. Large soft tissue wound or ulceration is seen involving distal tip of fourth finger. IMPRESSION: Large soft tissue wound or ulceration is seen involving distal tip of fourth finger. Small defect is seen involving the distal tuft of the fourth distal phalanx suggesting possible osteomyelitis. Electronically Signed   By: Marijo Conception M.D.   On: 10/04/2021 10:17    Procedures .Critical Care Performed by: Lennice Sites, DO Authorized by: Lennice Sites, DO   Critical care provider statement:    Critical care time (minutes):  35   Critical care was necessary to treat or prevent imminent or life-threatening deterioration of the following conditions: hyperkalemia.   Critical care was time spent personally by me on the following activities:  Blood draw for specimens, development of treatment plan with patient or surrogate, discussions with consultants, examination of patient, interpretation of cardiac output measurements, obtaining history from patient or surrogate, ordering and performing treatments and interventions, ordering and review of laboratory studies, ordering and review of radiographic studies, pulse oximetry, re-evaluation of patient's condition and review of old charts (hyperkalemia, osteomyelitis)   Care discussed with: admitting provider      Medications Ordered in ED Medications  morphine (PF) 4 MG/ML injection 4 mg (has no administration in time range)  Chlorhexidine Gluconate Cloth 2 % PADS 6 each (has no administration in time range)  calcium gluconate 1 g/ 50 mL sodium chloride IVPB (has no administration in time range)  vancomycin (VANCOREADY) IVPB 2000 mg/400 mL (has no administration in time range)  vancomycin (VANCOCIN) IVPB 1000 mg/200 mL premix (has no administration in time range)  piperacillin-tazobactam (ZOSYN) IVPB 3.375 g (has no administration in time range)  oxyCODONE-acetaminophen (PERCOCET/ROXICET) 5-325 MG per tablet 1 tablet (1 tablet Oral Given 10/04/21 1046)  sodium zirconium cyclosilicate (LOKELMA) packet 10 g (10 g Oral Given 10/04/21 1650)  HYDROcodone-acetaminophen (NORCO/VICODIN) 5-325 MG per tablet 1 tablet (1 tablet Oral Given 10/04/21 1750)    ED Course/ Medical Decision Making/ A&P                           Medical Decision Making Amount and/or Complexity of Data Reviewed Labs: ordered.  Risk Prescription drug management. Decision regarding hospitalization.   Edward [name] is here  with ongoing left hand pain.  Also some generalized weakness.  Normal vitals.  No fever.  Patient due for amputation of his left ring finger next week with Dr. Fredna Dow.  He has history of end-stage renal disease, coronary artery  disease.  Was supposed to have dialysis today but came here because of his ongoing hand pain.  He is on narcotic pain medicine for this.  Was on a course of doxycycline.  Follow-up with Dr. Fredna Dow several days ago.  He is scheduled for amputation next week.  Possible that this is related to renal disease or from carpal tunnel syndrome.  He is got good radial and ulnar pulses in his left hand.  He has what appears to be dry gangrene to his left ring finger.  He does have swelling to the finger as well as the dorsum of the back of the hand.  Started to have some drainage from his left index finger.  Differential includes worsening of hand infection, ongoing osteomyelitis, renal disease related issues including electrolyte abnormalities.  Will obtain CBC, BMP, lactic acid, x-ray of his hand.  Per Dr. Levell July note swelling was focally to his left ring finger.  Slightly worsening swelling today suspected based off of this note.  We will try to touch base with Dr. Fredna Dow.  Per my review of radiology report of his left hand x-ray there appears to be osteomyelitis involving the tip of the left ring finger.  He has no significant leukocytosis or lactic acidosis.  Potassium is 5.2.  While patient was here he started to complain of some chest pain.  EKG showed per my interpretation junctional type rhythm.  May be first-degree heart block.  Talked with Dr. Gasper Sells and he agrees likely junctional rhythm.  Overall will likely need telemetry and observation.  As potassium is elevated at 5.2.  Patient has been given calcium and Lokelma.  I have talked with Dr. Posey Pronto with nephrology who will get him on dialysis list.  After talking with Dr. Fredna Dow he originally wanted to see the patient tomorrow and  likely do surgery at outpatient surgical center but given his need for likely medical optimization he will come see him tomorrow.  May need to delay any surgical intervention until he is medically optimized.  He should be consulted.  Actually talked with Dr. Daryll Brod who is covering for Dr. Leanora Cover this week.  Patient to be admitted to hospitalist service after extensive conversation with him.  He did not want to stay originally but after talking with him about risks and benefits and need for likely pain control as well he was amenable to stay.  This chart was dictated using voice recognition software.  Despite best efforts to proofread,  errors can occur which can change the documentation meaning.         Final Clinical Impression(s) / ED Diagnoses Final diagnoses:  Finger pain, left  Hyperkalemia  Abnormal EKG    Rx / DC Orders ED Discharge Orders     None         Lennice Sites, DO 10/04/21 2056

## 2021-10-04 NOTE — Assessment & Plan Note (Signed)
-  Continue empiric IV vancomycin and Zosyn -Follows with hand surgery, Dr. Leanora Cover; case discussed with covering provider Dr. Daryll Brod who recommended formal consult in a.m. -Analgesics as needed

## 2021-10-05 ENCOUNTER — Other Ambulatory Visit: Payer: Self-pay | Admitting: Orthopedic Surgery

## 2021-10-05 ENCOUNTER — Encounter (HOSPITAL_BASED_OUTPATIENT_CLINIC_OR_DEPARTMENT_OTHER): Payer: Medicare Other | Admitting: Internal Medicine

## 2021-10-05 ENCOUNTER — Encounter (HOSPITAL_COMMUNITY)
Admission: EM | Disposition: A | Discharge status: Home / Self Care | Payer: Self-pay | Source: Home / Self Care | Attending: Family Medicine

## 2021-10-05 ENCOUNTER — Inpatient Hospital Stay (HOSPITAL_COMMUNITY): Payer: Medicare Other | Admitting: Anesthesiology

## 2021-10-05 ENCOUNTER — Encounter (HOSPITAL_COMMUNITY): Payer: Self-pay | Admitting: Internal Medicine

## 2021-10-05 ENCOUNTER — Other Ambulatory Visit: Payer: Self-pay

## 2021-10-05 DIAGNOSIS — M1990 Unspecified osteoarthritis, unspecified site: Secondary | ICD-10-CM

## 2021-10-05 DIAGNOSIS — M65142 Other infective (teno)synovitis, left hand: Secondary | ICD-10-CM

## 2021-10-05 DIAGNOSIS — N186 End stage renal disease: Secondary | ICD-10-CM

## 2021-10-05 DIAGNOSIS — E1152 Type 2 diabetes mellitus with diabetic peripheral angiopathy with gangrene: Secondary | ICD-10-CM

## 2021-10-05 DIAGNOSIS — Z992 Dependence on renal dialysis: Secondary | ICD-10-CM

## 2021-10-05 DIAGNOSIS — M869 Osteomyelitis, unspecified: Secondary | ICD-10-CM

## 2021-10-05 DIAGNOSIS — Z794 Long term (current) use of insulin: Secondary | ICD-10-CM

## 2021-10-05 DIAGNOSIS — I96 Gangrene, not elsewhere classified: Secondary | ICD-10-CM

## 2021-10-05 HISTORY — PX: AMPUTATION: SHX166

## 2021-10-05 LAB — BASIC METABOLIC PANEL
Anion gap: 28 — ABNORMAL HIGH (ref 5–15)
BUN: 78 mg/dL — ABNORMAL HIGH (ref 6–20)
CO2: 19 mmol/L — ABNORMAL LOW (ref 22–32)
Calcium: 9.2 mg/dL (ref 8.9–10.3)
Chloride: 89 mmol/L — ABNORMAL LOW (ref 98–111)
Creatinine, Ser: 10.99 mg/dL — ABNORMAL HIGH (ref 0.61–1.24)
GFR, Estimated: 5 mL/min — ABNORMAL LOW (ref >60)
Glucose, Bld: 113 mg/dL — ABNORMAL HIGH (ref 70–99)
Potassium: 7.4 mmol/L (ref 3.5–5.1)
Sodium: 136 mmol/L (ref 135–145)

## 2021-10-05 LAB — MAGNESIUM: Magnesium: 2.5 mg/dL — ABNORMAL HIGH (ref 1.7–2.4)

## 2021-10-05 LAB — RESP PANEL BY RT-PCR (FLU A&B, COVID) ARPGX2
Influenza A by PCR: NEGATIVE
Influenza B by PCR: NEGATIVE
SARS Coronavirus 2 by RT PCR: NEGATIVE

## 2021-10-05 LAB — GLUCOSE, CAPILLARY
Glucose-Capillary: 119 mg/dL — ABNORMAL HIGH (ref 70–99)
Glucose-Capillary: 133 mg/dL — ABNORMAL HIGH (ref 70–99)
Glucose-Capillary: 185 mg/dL — ABNORMAL HIGH (ref 70–99)

## 2021-10-05 LAB — CBC
HCT: 35.3 % — ABNORMAL LOW (ref 39.0–52.0)
Hemoglobin: 11 g/dL — ABNORMAL LOW (ref 13.0–17.0)
MCH: 30.8 pg (ref 26.0–34.0)
MCHC: 31.2 g/dL (ref 30.0–36.0)
MCV: 98.9 fL (ref 80.0–100.0)
Platelets: 220 10*3/uL (ref 150–400)
RBC: 3.57 MIL/uL — ABNORMAL LOW (ref 4.22–5.81)
RDW: 16.9 % — ABNORMAL HIGH (ref 11.5–15.5)
WBC: 9.7 10*3/uL (ref 4.0–10.5)
nRBC: 0.2 % (ref 0.0–0.2)

## 2021-10-05 LAB — POCT I-STAT, CHEM 8
BUN: 29 mg/dL — ABNORMAL HIGH (ref 6–20)
Calcium, Ion: 0.65 mmol/L — CL (ref 1.15–1.40)
Chloride: 83 mmol/L — ABNORMAL LOW (ref 98–111)
Creatinine, Ser: 5.8 mg/dL — ABNORMAL HIGH (ref 0.61–1.24)
Glucose, Bld: 300 mg/dL — ABNORMAL HIGH (ref 70–99)
HCT: 34 % — ABNORMAL LOW (ref 39.0–52.0)
Hemoglobin: 11.6 g/dL — ABNORMAL LOW (ref 13.0–17.0)
Potassium: 3.5 mmol/L (ref 3.5–5.1)
Sodium: 131 mmol/L — ABNORMAL LOW (ref 135–145)
TCO2: 23 mmol/L (ref 22–32)

## 2021-10-05 LAB — CBG MONITORING, ED: Glucose-Capillary: 145 mg/dL — ABNORMAL HIGH (ref 70–99)

## 2021-10-05 LAB — HEMOGLOBIN A1C
Hgb A1c MFr Bld: 8.6 % — ABNORMAL HIGH (ref 4.8–5.6)
Mean Plasma Glucose: 200.12 mg/dL

## 2021-10-05 LAB — TROPONIN I (HIGH SENSITIVITY): Troponin I (High Sensitivity): 128 ng/L (ref <18)

## 2021-10-05 LAB — HIV ANTIBODY (ROUTINE TESTING W REFLEX): HIV Screen 4th Generation wRfx: NONREACTIVE

## 2021-10-05 SURGERY — AMPUTATION DIGIT
Anesthesia: Monitor Anesthesia Care | Site: Finger | Laterality: Left

## 2021-10-05 MED ORDER — PHENYLEPHRINE 40 MCG/ML (10ML) SYRINGE FOR IV PUSH (FOR BLOOD PRESSURE SUPPORT)
PREFILLED_SYRINGE | INTRAVENOUS | Status: AC
  Filled 2021-10-05: qty 30

## 2021-10-05 MED ORDER — HYDROXYZINE HCL 50 MG/ML IM SOLN
25.0000 mg | Freq: Four times a day (QID) | INTRAMUSCULAR | Status: DC | PRN
Start: 2021-10-05 — End: 2021-10-05

## 2021-10-05 MED ORDER — FENTANYL CITRATE (PF) 100 MCG/2ML IJ SOLN: 25.0000 ug | Freq: Once | INTRAMUSCULAR | Status: AC

## 2021-10-05 MED ORDER — OXYCODONE HCL 5 MG PO TABS: 5.0000 mg | ORAL_TABLET | Freq: Once | ORAL | Status: DC | PRN

## 2021-10-05 MED ORDER — ACETAMINOPHEN 10 MG/ML IV SOLN: 1000.0000 mg | Freq: Once | INTRAVENOUS | Status: DC | PRN

## 2021-10-05 MED ORDER — LIDOCAINE HCL 1 % IJ SOLN
INTRAMUSCULAR | Status: AC
  Filled 2021-10-05: qty 20

## 2021-10-05 MED ORDER — LACTATED RINGERS IV SOLN: INTRAVENOUS | Status: DC

## 2021-10-05 MED ORDER — LIDOCAINE HCL (PF) 2 % IJ SOLN
INTRAMUSCULAR | Status: DC | PRN
  Administered 2021-10-05: 300 mg via PERINEURAL

## 2021-10-05 MED ORDER — CHLORHEXIDINE GLUCONATE 0.12 % MT SOLN
OROMUCOSAL | Status: AC
  Administered 2021-10-05: 15 mL via OROMUCOSAL
  Filled 2021-10-05: qty 15

## 2021-10-05 MED ORDER — CHLORHEXIDINE GLUCONATE CLOTH 2 % EX PADS
6.0000 | MEDICATED_PAD | Freq: Every day | CUTANEOUS | Status: DC
  Administered 2021-10-06: 6 via TOPICAL

## 2021-10-05 MED ORDER — OXYCODONE HCL 5 MG/5ML PO SOLN: 5.0000 mg | Freq: Once | ORAL | Status: DC | PRN

## 2021-10-05 MED ORDER — CEFAZOLIN SODIUM-DEXTROSE 2-4 GM/100ML-% IV SOLN
2.0000 g | INTRAVENOUS | Status: DC
  Filled 2021-10-05: qty 100

## 2021-10-05 MED ORDER — FENTANYL CITRATE (PF) 100 MCG/2ML IJ SOLN
INTRAMUSCULAR | Status: AC
  Administered 2021-10-05: 25 ug via INTRAVENOUS
  Filled 2021-10-05: qty 2

## 2021-10-05 MED ORDER — VANCOMYCIN HCL IN DEXTROSE 1-5 GM/200ML-% IV SOLN: 1000.0000 mg | Freq: Once | INTRAVENOUS | Status: DC

## 2021-10-05 MED ORDER — CEFAZOLIN SODIUM-DEXTROSE 2-4 GM/100ML-% IV SOLN
2.0000 g | INTRAVENOUS | Status: AC
  Administered 2021-10-05: 2 g via INTRAVENOUS

## 2021-10-05 MED ORDER — FENTANYL CITRATE (PF) 100 MCG/2ML IJ SOLN: 25.0000 ug | INTRAMUSCULAR | Status: DC | PRN

## 2021-10-05 MED ORDER — PROMETHAZINE HCL 25 MG/ML IJ SOLN: 6.2500 mg | INTRAMUSCULAR | Status: DC | PRN

## 2021-10-05 MED ORDER — CHLORHEXIDINE GLUCONATE 0.12 % MT SOLN: 15.0000 mL | Freq: Once | OROMUCOSAL | Status: AC

## 2021-10-05 MED ORDER — LIDOCAINE 2% (20 MG/ML) 5 ML SYRINGE
INTRAMUSCULAR | Status: AC
  Filled 2021-10-05: qty 10

## 2021-10-05 MED ORDER — PIPERACILLIN-TAZOBACTAM IN DEX 2-0.25 GM/50ML IV SOLN
2.2500 g | Freq: Three times a day (TID) | INTRAVENOUS | Status: DC
  Administered 2021-10-05 – 2021-10-06 (×3): 2.25 g via INTRAVENOUS
  Filled 2021-10-05 (×7): qty 50

## 2021-10-05 MED ORDER — LIDOCAINE HCL 2 % IJ SOLN
INTRAMUSCULAR | Status: AC
  Filled 2021-10-05: qty 20

## 2021-10-05 MED ORDER — VANCOMYCIN HCL IN DEXTROSE 1-5 GM/200ML-% IV SOLN
1000.0000 mg | INTRAVENOUS | Status: AC
  Administered 2021-10-05: 1000 mg via INTRAVENOUS
  Filled 2021-10-05: qty 200

## 2021-10-05 MED ORDER — GLYCOPYRROLATE PF 0.2 MG/ML IJ SOSY
PREFILLED_SYRINGE | INTRAMUSCULAR | Status: AC
  Filled 2021-10-05: qty 1

## 2021-10-05 MED ORDER — ROCURONIUM BROMIDE 10 MG/ML (PF) SYRINGE
PREFILLED_SYRINGE | INTRAVENOUS | Status: AC
  Filled 2021-10-05: qty 10

## 2021-10-05 MED ORDER — DEXAMETHASONE SODIUM PHOSPHATE 10 MG/ML IJ SOLN
INTRAMUSCULAR | Status: AC
  Filled 2021-10-05: qty 1

## 2021-10-05 MED ORDER — MIDAZOLAM HCL 2 MG/2ML IJ SOLN
INTRAMUSCULAR | Status: AC
  Filled 2021-10-05: qty 2

## 2021-10-05 MED ORDER — LIDOCAINE-EPINEPHRINE 1 %-1:100000 IJ SOLN
INTRAMUSCULAR | Status: AC
Start: 2021-10-05 — End: 2021-10-05
  Filled 2021-10-05: qty 1

## 2021-10-05 MED ORDER — SODIUM CHLORIDE 0.9 % IV SOLN: INTRAVENOUS | Status: DC

## 2021-10-05 MED ORDER — ORAL CARE MOUTH RINSE: 15.0000 mL | Freq: Once | OROMUCOSAL | Status: AC

## 2021-10-05 MED ORDER — HYDROXYZINE HCL 25 MG PO TABS
25.0000 mg | ORAL_TABLET | Freq: Three times a day (TID) | ORAL | Status: DC | PRN
  Administered 2021-10-05 – 2021-10-06 (×3): 25 mg via ORAL
  Filled 2021-10-05 (×3): qty 1

## 2021-10-05 MED ORDER — ONDANSETRON HCL 4 MG/2ML IJ SOLN
INTRAMUSCULAR | Status: AC
  Filled 2021-10-05: qty 4

## 2021-10-05 MED ORDER — LIDOCAINE-EPINEPHRINE 1 %-1:100000 IJ SOLN
INTRAMUSCULAR | Status: DC | PRN
  Administered 2021-10-05: 15 mL

## 2021-10-05 MED ORDER — MORPHINE SULFATE (PF) 2 MG/ML IV SOLN
2.0000 mg | INTRAVENOUS | Status: DC | PRN
  Administered 2021-10-06: 2 mg via INTRAVENOUS
  Filled 2021-10-05 (×2): qty 1

## 2021-10-05 SURGICAL SUPPLY — 46 items
BAG COUNTER SPONGE SURGICOUNT (BAG) ×3 IMPLANT
BAG SPNG CNTER NS LX DISP (BAG) ×1
BAND INSRT 18 STRL LF DISP RB (MISCELLANEOUS) ×1
BAND RUBBER #18 3X1/16 STRL (MISCELLANEOUS) ×3 IMPLANT
BNDG CMPR 9X4 STRL LF SNTH (GAUZE/BANDAGES/DRESSINGS) ×1
BNDG COHESIVE 3X5 TAN STRL LF (GAUZE/BANDAGES/DRESSINGS) IMPLANT
BNDG ESMARK 4X9 LF (GAUZE/BANDAGES/DRESSINGS) ×2 IMPLANT
BNDG GAUZE ELAST 4 BULKY (GAUZE/BANDAGES/DRESSINGS) ×2 IMPLANT
CORD BIPOLAR FORCEPS 12FT (ELECTRODE) ×2 IMPLANT
CUFF TOURN SGL QUICK 18X4 (TOURNIQUET CUFF) ×2 IMPLANT
CUFF TOURN SGL QUICK 24 (TOURNIQUET CUFF)
CUFF TRNQT CYL 24X4X16.5-23 (TOURNIQUET CUFF) IMPLANT
DRSG KUZMA FLUFF (GAUZE/BANDAGES/DRESSINGS) IMPLANT
DURAPREP 26ML APPLICATOR (WOUND CARE) ×2 IMPLANT
GAUZE PACKING IODOFORM 1/4X15 (PACKING) ×1 IMPLANT
GAUZE SPONGE 4X4 12PLY STRL (GAUZE/BANDAGES/DRESSINGS) ×1 IMPLANT
GAUZE XEROFORM 1X8 LF (GAUZE/BANDAGES/DRESSINGS) ×2 IMPLANT
GLOVE SURG ENC MOIS LTX SZ6.5 (GLOVE) ×2 IMPLANT
GLOVE SURG ORTHO LTX SZ8 (GLOVE) ×3 IMPLANT
GLOVE SURG UNDER POLY LF SZ8.5 (GLOVE) ×2 IMPLANT
GOWN STRL REUS W/ TWL LRG LVL3 (GOWN DISPOSABLE) ×4 IMPLANT
GOWN STRL REUS W/ TWL XL LVL3 (GOWN DISPOSABLE) ×2 IMPLANT
GOWN STRL REUS W/TWL LRG LVL3 (GOWN DISPOSABLE) ×4
GOWN STRL REUS W/TWL XL LVL3 (GOWN DISPOSABLE) ×2
KIT BASIN OR (CUSTOM PROCEDURE TRAY) ×2 IMPLANT
KIT TURNOVER KIT B (KITS) ×2 IMPLANT
LOOP VESSEL MAXI BLUE (MISCELLANEOUS) ×1 IMPLANT
MANIFOLD NEPTUNE II (INSTRUMENTS) ×2 IMPLANT
NDL HYPO 25GX1X1/2 BEV (NEEDLE) IMPLANT
NEEDLE HYPO 25GX1X1/2 BEV (NEEDLE) IMPLANT
NS IRRIG 1000ML POUR BTL (IV SOLUTION) ×3 IMPLANT
PACK ORTHO EXTREMITY (CUSTOM PROCEDURE TRAY) ×3 IMPLANT
PAD ARMBOARD 7.5X6 YLW CONV (MISCELLANEOUS) ×6 IMPLANT
PAD CAST 4YDX4 CTTN HI CHSV (CAST SUPPLIES) IMPLANT
PADDING CAST COTTON 4X4 STRL (CAST SUPPLIES)
SPECIMEN JAR SMALL (MISCELLANEOUS) ×2 IMPLANT
SUT CHROMIC 4 0 PS 2 18 (SUTURE) ×1 IMPLANT
SUT ETHILON 4 0 PS 2 18 (SUTURE) ×1 IMPLANT
SUT ETHILON 5 0 PS 2 18 (SUTURE) IMPLANT
SUT SILK 4 0 PS 2 (SUTURE) IMPLANT
SUT VICRYL 4-0 PS2 18IN ABS (SUTURE) IMPLANT
SYR CONTROL 10ML LL (SYRINGE) IMPLANT
TOWEL GREEN STERILE (TOWEL DISPOSABLE) ×2 IMPLANT
TOWEL GREEN STERILE FF (TOWEL DISPOSABLE) ×3 IMPLANT
UNDERPAD 30X36 HEAVY ABSORB (UNDERPADS AND DIAPERS) ×3 IMPLANT
WATER STERILE IRR 1000ML POUR (IV SOLUTION) ×2 IMPLANT

## 2021-10-05 NOTE — Progress Notes (Signed)
PROGRESS NOTE    Edward [name]  XKG:[id number] DOB: May 06, 1971 DOA: 10/04/2021 PCP: [name], MD   Brief Narrative:  Edward [name] is a 51 y.o. male with medical history significant for ESRD on TTS HD, CAD s/p PCI with DES (left circumflex 07/2018), chronic combined systolic and diastolic CHF (EF 70-26% November 2022), severe mitral and tricuspid valve regurgitation, PVD s/p right BKA, insulin-dependent diabetes, hyperlipidemia, Hx Mobitz 1 heart block, orthostatic hypotension with dialysis who presented to the ED for evaluation of worsening left fourth finger infection/wound.  Patient has been following with hand surgery, Dr. Leanora Cover, for possible carpal tunnel or ulnar nerve compression bilaterally.  He was seen in office on 10/01/2021 where he was noted to have necrosis of the distal left ring finger. Amputation was recommended and scheduled for 10/11/2021.  He was started on doxycycline as an outpatient.  Due to worsening pain and some serosanguineous type discharge, he decided to come to the ED.  While in the ED, patient reported left-sided chest pain described as a burning sensation.  This felt different than his prior heart attack.  He reports significant indigestion recently and is scheduled for EGD in April.  He was also scheduled for outpatient stress testing by his cardiologist, Dr. Stanford Breed, currently planned for 10/10/2021. Patient states he missed his dialysis today because he came to the ED early this morning.   Upon arrival to ED, initial vitals showed BP 99/54, pulse 48, RR 17, temp 97.7 F, SPO2 96% on room air.   Labs show sodium 139, potassium 5.2, bicarb 24, BUN 64, creatinine 9.41, serum glucose 194, WBC 8.5, hemoglobin 10.2, platelets 209,000, lactic acid 1.5, troponin 118.    left hand x-ray shows large soft tissue wound or ulceration involving distal tip of fourth finger with possible osteomyelitis involving the distal tuft of the fourth distal phalanx.  He was  started on Zosyn and vancomycin and Lokelma was given.  Patient was admitted to hospital service, hand surgery, nephrology was consulted.  Also, patient was found to have junctional rhythm in the ED, cardiology was consulted.  Assessment & Plan:   Principal Problem:   Osteomyelitis of finger of left hand (HCC) Active Problems:   Diabetes mellitus with complication, with long-term current use of insulin (HCC)   Hyperkalemia   Anemia of chronic disease   Chronic combined systolic and diastolic CHF (congestive heart failure) (HCC)   CAD S/P percutaneous coronary angioplasty   ESRD on hemodialysis (HCC)   Junctional rhythm  Osteomyelitis of finger of left hand ([place])- (present on admission): He has been seen by hand surgery, plan is for amputation either today or tomorrow.  Continue antibiotics/vancomycin and Zosyn.  Continue current pain management.  Hyperkalemia- (present on admission): Upon admission, potassium was 5.2, he received medications for hyperkalemia but despite of that potassium worsened 7.4 this morning.  He was taken to HD on urgent basis.  Appreciate nephrology help.   Chest pain/junctional rhythm- (present on admission) Patient with known history of Mobitz 1 AV block, EKG on arrival looks like junctional rhythm.  He was reassessed by cardiology today in dialysis and per their note, his junctional rhythm has resolved presumably secondary to resolving hyperkalemia and they have signed off.  Patient has no chest pain.  Troponin slightly elevated but flat could be due to ESRD.  He is planned for outpatient stress test.  Continue DAPT with aspirin and Plavix.  Cardiology saw and signed off.   Chronic combined systolic and diastolic  CHF (congestive heart failure) ([place])- (present on admission) Last EF 40-45% November 2022 at [facility].  Compensated on admission.  Volume control with dialysis.   ESRD on hemodialysis [facility]) Nephrology following for HD needs.    Diabetes mellitus with  complication, with long-term current use of insulin ([place]): Appears to be taking 70/30, 30 units twice daily at home which is on hold.  Hemoglobin A1c 8.6.  He is on SSI currently and blood sugar is controlled.   Anemia of chronic disease- (present on admission) Currently stable, continue to monitor.   DVT prophylaxis: heparin injection 5,000 Units Start: 10/04/21 2200   Code Status: Full Code  Family Communication:  None present at bedside.  Plan of care discussed with patient in length and he/she verbalized understanding and agreed with it.  Status is: Inpatient Remains inpatient appropriate because: Needs surgery for osteomyelitis.           Estimated body mass index is 30.85 kg/m as calculated from the following:   Height as of 09/21/21: 6\' 1"  (1.854 m).   Weight as of 09/21/21: 106.1 kg.    Nutritional Assessment: There is no height or weight on file to calculate BMI.. Seen by dietician.  I agree with the assessment and plan as outlined below: Nutrition Status:        . Skin Assessment: I have examined the patient's skin and I agree with the wound assessment as performed by the wound care RN as outlined below:    Consultants:  Orthopedics Nephrology  Procedures:  None  Antimicrobials:  Anti-infectives (From admission, onward)    Start     Dose/Rate Route Frequency Ordered Stop   10/06/21 1200  vancomycin (VANCOCIN) IVPB 1000 mg/200 mL premix        1,000 mg 200 mL/hr over 60 Minutes Intravenous Every T-Th-Sa (Hemodialysis) 10/04/21 2040     10/04/21 2115  vancomycin (VANCOREADY) IVPB 2000 mg/400 mL        2,000 mg 200 mL/hr over 120 Minutes Intravenous  Once 10/04/21 2040 10/05/21 0233   10/04/21 2100  piperacillin-tazobactam (ZOSYN) IVPB 3.375 g  Status:  Discontinued        3.375 g 12.5 mL/hr over 240 Minutes Intravenous Every 6 hours 10/04/21 2040 10/04/21 2044   10/04/21 2100  piperacillin-tazobactam (ZOSYN) IVPB 3.375 g        3.375 g 100 mL/hr over  30 Minutes Intravenous Every 6 hours 10/04/21 2044     10/04/21 1915  cefTRIAXone (ROCEPHIN) injection 1 g  Status:  Discontinued        1 g Intramuscular  Once 10/04/21 1909 10/04/21 1933         Subjective: Seen and examined in dialysis.  He had some pain in the left hand but controlled than yesterday.  No other complaint.  Objective: Vitals:   10/05/21 1000 10/05/21 1030 10/05/21 1100 10/05/21 1130  BP: (!) 159/82 (!) 161/84 (!) 157/78 (!) 169/88  Pulse: 81 82 85 83  Resp:      Temp:      TempSrc:      SpO2:        Intake/Output Summary (Last 24 hours) at 10/05/2021 1302 Last data filed at 10/04/2021 2242 Gross per 24 hour  Intake 50 ml  Output --  Net 50 ml   There were no vitals filed for this visit.  Examination:  General exam: Appears calm and comfortable  Respiratory system: Clear to auscultation. Respiratory effort normal. Cardiovascular system: S1 & S2 heard, RRR.  No JVD, murmurs, rubs, gallops or clicks. No pedal edema. Gastrointestinal system: Abdomen is nondistended, soft and nontender. No organomegaly or masses felt. Normal bowel sounds heard. Central nervous system: Alert and oriented. No focal neurological deficits. Extremities: Right BKA, necrotic fourth finger of the left hand Skin: No rashes, lesions or ulcers  Data Reviewed: I have personally reviewed following labs and imaging studies  CBC: Recent Labs  Lab 10/04/21 0726 10/05/21 0500  WBC 8.5 9.7  NEUTROABS 6.1  --   HGB 10.2* 11.0*  HCT 31.9* 35.3*  MCV 97.0 98.9  PLT 209 704   Basic Metabolic Panel: Recent Labs  Lab 10/04/21 0726 10/05/21 0500  NA 139 136  K 5.2* 7.4*  CL 93* 89*  CO2 24 19*  GLUCOSE 194* 113*  BUN 64* 78*  CREATININE 9.41* 10.99*  CALCIUM 9.1 9.2  MG  --  2.5*   GFR: Estimated Creatinine Clearance: 10.2 mL/min (A) (by C-G formula based on SCr of 10.99 mg/dL (H)). Liver Function Tests: No results for input(s): AST, ALT, ALKPHOS, BILITOT, PROT, ALBUMIN in  the last 168 hours. No results for input(s): LIPASE, AMYLASE in the last 168 hours. No results for input(s): AMMONIA in the last 168 hours. Coagulation Profile: No results for input(s): INR, PROTIME in the last 168 hours. Cardiac Enzymes: No results for input(s): CKTOTAL, CKMB, CKMBINDEX, TROPONINI in the last 168 hours. BNP (last 3 results) No results for input(s): PROBNP in the last 8760 hours. HbA1C: Recent Labs    10/05/21 0500  HGBA1C 8.6*   CBG: Recent Labs  Lab 10/05/21 0012  GLUCAP 145*   Lipid Profile: No results for input(s): CHOL, HDL, LDLCALC, TRIG, CHOLHDL, LDLDIRECT in the last 72 hours. Thyroid Function Tests: No results for input(s): TSH, T4TOTAL, FREET4, T3FREE, THYROIDAB in the last 72 hours. Anemia Panel: No results for input(s): VITAMINB12, FOLATE, FERRITIN, TIBC, IRON, RETICCTPCT in the last 72 hours. Sepsis Labs: Recent Labs  Lab 10/04/21 0726  LATICACIDVEN 1.5    Recent Results (from the past 240 hour(s))  Resp Panel by RT-PCR (Flu A&B, Covid) Nasopharyngeal Swab     Status: None   Collection Time: 10/04/21 11:51 PM   Specimen: Nasopharyngeal Swab; Nasopharyngeal(NP) swabs in vial transport medium  Result Value Ref Range Status   SARS Coronavirus 2 by RT PCR NEGATIVE NEGATIVE Final    Comment: (NOTE) SARS-CoV-2 target nucleic acids are NOT DETECTED.  The SARS-CoV-2 RNA is generally detectable in upper respiratory specimens during the acute phase of infection. The lowest concentration of SARS-CoV-2 viral copies this assay can detect is 138 copies/mL. A negative result does not preclude SARS-Cov-2 infection and should not be used as the sole basis for treatment or other patient management decisions. A negative result may occur with  improper specimen collection/handling, submission of specimen other than nasopharyngeal swab, presence of viral mutation(s) within the areas targeted by this assay, and inadequate number of viral copies(<138  copies/mL). A negative result must be combined with clinical observations, patient history, and epidemiological information. The expected result is Negative.  Fact Sheet for Patients:  EntrepreneurPulse.com.au  Fact Sheet for Healthcare Providers:  IncredibleEmployment.be  This test is no t yet approved or cleared by the [place] FDA and  has been authorized for detection and/or diagnosis of SARS-CoV-2 by FDA under an Emergency Use Authorization (EUA). This EUA will remain  in effect (meaning this test can be used) for the duration of the COVID-19 declaration under Section 564(b)(1) of the Act, 21 U.S.C.section  360bbb-3(b)(1), unless the authorization is terminated  or revoked sooner.       Influenza A by PCR NEGATIVE NEGATIVE Final   Influenza B by PCR NEGATIVE NEGATIVE Final    Comment: (NOTE) The Xpert Xpress SARS-CoV-2/FLU/RSV plus assay is intended as an aid in the diagnosis of influenza from Nasopharyngeal swab specimens and should not be used as a sole basis for treatment. Nasal washings and aspirates are unacceptable for Xpert Xpress SARS-CoV-2/FLU/RSV testing.  Fact Sheet for Patients: EntrepreneurPulse.com.au  Fact Sheet for Healthcare Providers: IncredibleEmployment.be  This test is not yet approved or cleared by the [place] FDA and has been authorized for detection and/or diagnosis of SARS-CoV-2 by FDA under an Emergency Use Authorization (EUA). This EUA will remain in effect (meaning this test can be used) for the duration of the COVID-19 declaration under Section 564(b)(1) of the Act, 21 U.S.C. section 360bbb-3(b)(1), unless the authorization is terminated or revoked.  Performed at [facility], [place] [address] [place], [place] [address]      Radiology Studies: DG Hand Complete Left  Result Date: 10/04/2021 CLINICAL DATA:  Right finger pain and swelling. EXAM: LEFT  HAND - COMPLETE 3+ VIEW COMPARISON:  None. FINDINGS: There is no evidence of fracture or dislocation. There is no evidence of arthropathy. Small defect is seen involving the distal tip of the fourth distal phalanx suggesting possible osteomyelitis. Vascular calcifications are noted. Large soft tissue wound or ulceration is seen involving distal tip of fourth finger. IMPRESSION: Large soft tissue wound or ulceration is seen involving distal tip of fourth finger. Small defect is seen involving the distal tuft of the fourth distal phalanx suggesting possible osteomyelitis. Electronically Signed   By: Marijo Conception M.D.   On: 10/04/2021 10:17    Scheduled Meds:  aspirin EC  81 mg Oral Daily   atorvastatin  80 mg Oral Daily   Chlorhexidine Gluconate Cloth  6 each Topical Q0600   Chlorhexidine Gluconate Cloth  6 each Topical Q0600   clopidogrel  75 mg Oral Daily   gabapentin  100 mg Oral TID   heparin  5,000 Units Subcutaneous Q8H   insulin aspart  0-5 Units Subcutaneous QHS   insulin aspart  0-9 Units Subcutaneous TID WC   linaclotide  72 mcg Oral QAC breakfast   pantoprazole  40 mg Oral BID   sodium chloride flush  3 mL Intravenous Q12H   sucroferric oxyhydroxide  500 mg Oral TID WC   Continuous Infusions:  piperacillin-tazobactam Stopped (10/05/21 0640)   [START ON 10/06/2021] vancomycin       LOS: 1 day   Time spent: 36 minutes  Darliss Cheney, MD [facility]  10/05/2021, 1:02 PM  Please page via [facility] and do not message via secure chat for urgent patient care matters. Secure chat can be used for non urgent patient care matters.  How to contact the [facility] Attending or Consulting provider [place] or covering provider during after hours [place], for this patient?  Check the care team in [facility] and look for a) attending/consulting TR[facility] provider listed and b) the [facility] team listed. Page or secure chat [phone]. Log into [email or web address] and use [place]'s universal password to access. If you do  not have the password, please contact the hospital operator. Locate the [facility] provider you are looking for under [facility] and page to a number that you can be directly reached. If you still have difficulty reaching the provider, please page the [facility] (  Director on Call) for the Hospitalists listed on amion for assistance.

## 2021-10-05 NOTE — ED Notes (Signed)
Pt transported to HD.

## 2021-10-05 NOTE — Brief Op Note (Signed)
10/05/2021  6:19 PM  PATIENT:  Edward [name]  51 y.o. male  PRE-OPERATIVE DIAGNOSIS:  LEFT RING FINGER NECROSIS/ISCHEMIA  POST-OPERATIVE DIAGNOSIS:  LEFT RING FINGER NECROSIS/ISCHEMIA  PROCEDURE:  Procedure(s) with comments: LEFT RING FINGER AMPUTATION (Left) - 45 MIN  SURGEON:  Surgeon(s) and Role:    Daryll Brod, MD - Primary  PHYSICIAN ASSISTANT:   ASSISTANTS: none   ANESTHESIA:   regional and IV sedation  EBL:  4ml  BLOOD ADMINISTERED:none  DRAINS: Packing  LOCAL MEDICATIONS USED:  NONE  SPECIMEN:  Excision  DISPOSITION OF SPECIMEN:  PATHOLOGY  COUNTS:  YES  TOURNIQUET:   Total Tourniquet Time Documented: Forearm (Left) - 24 minutes Total: Forearm (Left) - 24 minutes   DICTATION: .Dragon Dictation  PLAN OF CARE: Discharge to home after PACU  PATIENT DISPOSITION:  PACU - hemodynamically stable.

## 2021-10-05 NOTE — Consult Note (Signed)
[facility] Renal Consultation Note    Indication for Consultation:  Management of ESRD/hemodialysis, anemia, hypertension/volume, and secondary hyperparathyroidism.  HPI: Edward [name] is a 51 y.o. male with PMH including ESRD on dialysis TTS, CHF, CAD, T2DM, HTN, and MI who presented to the ED on 08/03/22 with finger pain and swelling. Pt has been followed by Dr. Fredna Dow for possible carpal tunnel or ulnar nerve compression. He was seen in his office on 10/01/21 for necrosis of the finger tip and was started on doxycycline with plan for amputation on 10/11/21, however he noted worsening pain and swelling extending into his hand, so he came to the ED. X-ray showed large soft tissue wound and osteomyelitis. He also reported left sided chest pain and burning sensation. Hx of CAD and indigestion. Report he was given a medicine last night that relieved his chest pain and it has not returned. Cardiology was consulted for junctional rhythm and pre-op cardiac clearance. Pt was also started on broad spectrum antibiotics.  Nephrology was consulted for management of ESRD. Labs this AM notable for K+ 7.4, BUN 78, Cr 10.99, Hgb 11.0, WBC 9.7, Plt 220. Pt reports he missed dialysis yesterday and also drank some orange juice. Emergent HD was arranged and he is seen this AM in the dialysis unit. He denies any chest pain, palpitations, dizziness, shortness of breath, abdominal pain, nausea, vomiting, diarrhea. No fever or chills. He reports his finger pain is currently 8/10.   Past Medical History:  Diagnosis Date   Allergy    Anemia    getting Venofer with dialysis   Anxiety    self reported, sometimes-situational anxiety   Arthritis    bilateral hands/LEFT hip   CHF (congestive heart failure) (HCC)    Chronic kidney disease    t th s dialysis- [place] ---ESRD   Complication of anesthesia    slow to go to sleep   Coronary artery disease    Depression    self reported, sometimes-situational  depression   Diabetes mellitus    type II- on meds   Dyspnea    sometimes- fluid   GERD (gastroesophageal reflux disease)    on meds   Hypertension    no longer on medication since starting on Hemodialysis   Myocardial infarction [facility]) 2019   Sleep apnea    Past Surgical History:  Procedure Laterality Date   A/V FISTULAGRAM Left 03/06/2020   Procedure: A/V FISTULAGRAM;  Surgeon: Waynetta Sandy, MD;  Location: [place] [facility];  Service: Cardiovascular;  Laterality: Left;   ABDOMINAL AORTOGRAM W/LOWER EXTREMITY N/A 04/09/2021   Procedure: ABDOMINAL AORTOGRAM W/LOWER EXTREMITY;  Surgeon: Waynetta Sandy, MD;  Location: [place] [facility];  Service: Cardiovascular;  Laterality: N/A;   AMPUTATION Left 05/10/2016   Procedure: Left Transmetatarsal Amputation;  Surgeon: [name], MD;  Location: [place];  Service: Orthopedics;  Laterality: Left;   AMPUTATION Right 05/16/2018   Procedure: PARTIAL RAY AMPUTATION FIFTH TOE RIGHT FOOT;  Surgeon: [name], DPM;  Location: [place];  Service: Podiatry;  Laterality: Right;   AMPUTATION Right 06/17/2018   Procedure: AMPUTATION 4th RAY Right Foot;  Surgeon: Trula Slade, DPM;  Location: [place];  Service: Podiatry;  Laterality: Right;   AMPUTATION Right 06/19/2018   Procedure: RIGHT BELOW KNEE AMPUTATION;  Surgeon: [name], MD;  Location: [place];  Service: Orthopedics;  Laterality: Right;   APPLICATION OF WOUND VAC Right 06/19/2018   Procedure: APPLICATION OF WOUND VAC;  Surgeon: [name], MD;  Location: [place];  Service: Orthopedics;  Laterality: Right;   AV FISTULA PLACEMENT Left 11/25/2019   Procedure: LEFT ARM Brachiocephalic  ARTERIOVENOUS (AV) FISTULA CREATION;  Surgeon: Rosetta Posner, MD;  Location: [place];  Service: Vascular;  Laterality: Left;   [place] Left 07/19/2020   Procedure: LEFT SECOND STAGE [place];  Surgeon: Waynetta Sandy, MD;  Location: [place];   Service: Vascular;  Laterality: Left;   CARDIAC CATHETERIZATION  07/29/2018   CIRCUMCISION  09/02/2011   Procedure: CIRCUMCISION ADULT;  Surgeon: Molli Hazard, MD;  Location: [place];  Service: Urology;  Laterality: N/A;   CORONARY STENT INTERVENTION N/A 07/29/2018   Procedure: CORONARY STENT INTERVENTION;  Surgeon: Leonie Man, MD;  Location: [place] [facility];  Service: Cardiovascular;  Laterality: N/A;   I & D EXTREMITY  09/02/2011   Procedure: IRRIGATION AND DEBRIDEMENT EXTREMITY;  Surgeon: Theodoro Kos, DO;  Location: [place];  Service: Plastics;  Laterality: N/A;   INCISION AND DRAINAGE OF WOUND  08/12/2011   Procedure: IRRIGATION AND DEBRIDEMENT WOUND;  Surgeon: Zenovia Jarred, MD;  Location: [place];  Service: General;;  perineum   INSERTION OF DIALYSIS CATHETER N/A 11/25/2019   Procedure: INSERTION OF [place] Internal Jugular DIALYSIS CATHETER;  Surgeon: Rosetta Posner, MD;  Location: [place];  Service: Vascular;  Laterality: N/A;   [place]  08/12/2011   Procedure: IRRIGATION AND DEBRIDEMENT ABSCESS;  Surgeon: Molli Hazard, MD;  Location: [place];  Service: Urology;  Laterality: N/A;  irrigation and debridment perineum     IRRIGATION AND DEBRIDEMENT ABSCESS  08/14/2011   Procedure: MINOR INCISION AND DRAINAGE OF ABSCESS;  Surgeon: Molli Hazard, MD;  Location: [place];  Service: Urology;  Laterality: N/A;  Perineal Wound Debridement;Placement Bilateral Testicular Thigh Pouches   LEFT HEART CATH AND CORONARY ANGIOGRAPHY N/A 07/29/2018   Procedure: LEFT HEART CATH AND CORONARY ANGIOGRAPHY;  Surgeon: Leonie Man, MD;  Location: [place] [facility];  Service: Cardiovascular;  Laterality: N/A;   PERIPHERAL VASCULAR ATHERECTOMY Left 04/09/2021   Procedure: PERIPHERAL VASCULAR ATHERECTOMY;  Surgeon: Waynetta Sandy, MD;  Location: [place] [facility];  Service: Cardiovascular;  Laterality: Left;  TP  trunk and peroneal arteries   PERIPHERAL VASCULAR BALLOON ANGIOPLASTY Left 04/09/2021   Procedure: PERIPHERAL VASCULAR BALLOON ANGIOPLASTY;  Surgeon: Waynetta Sandy, MD;  Location: [place] [facility];  Service: Cardiovascular;  Laterality: Left;  Popliteal   REVISON OF ARTERIOVENOUS FISTULA Left 05/24/2020   Procedure: LEFT ARM ARTERIOVENOUS FISTULA  CONVERSION TO BASILIC VEIN FISTULA;  Surgeon: Waynetta Sandy, MD;  Location: [place];  Service: Vascular;  Laterality: Left;   TOTAL HIP ARTHROPLASTY Left 10/11/2016   Procedure: LEFT TOTAL HIP ARTHROPLASTY ANTERIOR APPROACH;  Surgeon: Mcarthur Rossetti, MD;  Location: [facility];  Service: Orthopedics;  Laterality: Left;   Family History  Problem Relation Age of Onset   Diabetes Other    Pancreatic cancer Mother 57   Colon cancer Neg Hx    Colon polyps Neg Hx    Esophageal cancer Neg Hx    Rectal cancer Neg Hx    Stomach cancer Neg Hx    Social History:  reports that he quit smoking about 22 months ago. His smoking use included cigarettes. He has a 6.00 pack-year smoking history. He has never used smokeless tobacco. He reports that he does not currently use alcohol. He  reports that he does not use drugs.  ROS: As per HPI otherwise negative.  Physical Exam: Vitals:   10/05/21 0436 10/05/21 0600 10/05/21 0719 10/05/21 0802  BP: 94/80 140/77 (!) 155/78 (!) 152/80  Pulse: (!) 57 64 67 67  Resp: (!) _0 Temp:   97.8 F (36.6 C) 98.1 F (36.7 C)  TempSrc:   Oral Oral  SpO2: 98% 91% 98%      General: Well developed, well nourished, alert and in no acute distress. Head: Normocephalic, atraumatic, sclera non-icteric, mucus membranes are moist. Neck: VD not elevated. Lungs: Clear bilaterally to auscultation without wheezes, rales, or rhonchi. Breathing is unlabored on RA Heart: RRR with normal S1, S2. No murmurs, rubs, or gallops appreciated. Abdomen: Soft, non-tender, non-distended with normoactive bowel sounds.  No rebound/guarding. No obvious abdominal masses. Lower extremities: Trace pitting edema bilateral lower extremities, R bka Neuro: Alert and oriented X 3. Moves all extremities spontaneously. Psych:  Responds to questions appropriately with a normal affect. Dialysis Access: LUE AVF +t/b  Allergies  Allergen Reactions   Crestor [Rosuvastatin] Rash    Also had a red eye   Benadryl [Diphenhydramine]    Prior to Admission medications   Medication Sig Start Date End Date Taking? Authorizing Provider  acetaminophen (TYLENOL) 500 MG tablet Take 500-1,000 mg by mouth every 6 (six) hours as needed for moderate pain or headache.   Yes [provider]  aspirin EC 81 MG tablet Take 1 tablet (81 mg total) by mouth daily. Swallow whole. 03/14/20  Yes Lelon Perla, MD  atorvastatin (LIPITOR) 80 MG tablet Take 1 tablet (80 mg total) by mouth daily. 09/21/21 12/20/21 Yes Lelon Perla, MD  bismuth subsalicylate (PEPTO BISMOL) 262 MG/15ML suspension Take 30 mLs by mouth every 6 (six) hours as needed for indigestion.   Yes [provider]  blood glucose meter kit and supplies Dispense based on patient and insurance preference. Use up to four times daily as directed. (FOR ICD-9 250.00, 250.01). 07/01/18  Yes Angiulli, Lavon Paganini, PA-C  clopidogrel (PLAVIX) 75 MG tablet Take 1 tablet (75 mg total) by mouth daily. 04/09/21 04/09/22 Yes Waynetta Sandy, MD  gabapentin (NEURONTIN) 100 MG capsule Take 100 mg by mouth 3 (three) times daily. 08/09/21  Yes [provider]  HYDROcodone-acetaminophen (NORCO/VICODIN) 5-325 MG tablet Take 1 tablet by mouth every 6 (six) hours as needed for moderate pain. 10/02/21  Yes [provider]  ibuprofen (ADVIL) 200 MG tablet Take 600 mg by mouth every 6 (six) hours as needed for headache or moderate pain.   Yes [provider]  insulin aspart protamine - aspart (NOVOLOG MIX 70/30 FLEXPEN) (70-30) 100 UNIT/ML FlexPen Inject 0.3 mLs  (30 Units total) into the skin 2 (two) times daily with a meal. Patient taking differently: Inject 30 Units into the skin with breakfast, with lunch, and with evening meal. 09/29/20  Yes Lavina Hamman, MD  Insulin Pen Needle 31G X 5 MM MISC Use daily to inject insulin as instructed 07/01/18  Yes Angiulli, Lavon Paganini, PA-C  lidocaine-prilocaine (EMLA) cream Apply 1 application topically See admin instructions. Apply a small amount to access site (AVF) 1-2 hours before dialysis. Cover with occlusive dressing (saran wrap). Tuesday,Thursday,saturday 03/22/21  Yes [provider]  linaclotide (LINZESS) 72 MCG capsule Take 1 capsule (72 mcg total) by mouth daily before breakfast. Patient taking differently: Take 72 mcg by mouth daily as needed (constipation). 12/13/20  Yes Armbruster, Carlota Raspberry, MD  pantoprazole (PROTONIX) 40 MG tablet Take 1 tablet (40 mg total) by mouth in the morning and at bedtime. 08/29/21  Yes Willia Craze, NP  triamcinolone ointment (KENALOG) 0.1 % 1 application 2 (two) times daily. 09/04/21  Yes [provider]  VELPHORO 500 MG chewable tablet Chew 500 mg by mouth 3 (three) times daily with meals.  06/14/20  Yes [provider]  linaclotide Rolan Lipa) 145 MCG CAPS capsule Take 1 capsule (145 mcg total) by mouth daily before breakfast. Patient not taking: Reported on 09/21/2021 12/13/20   Yetta Flock, MD  polyethylene glycol (MIRALAX) 17 g packet Take 17 g by mouth daily. Patient not taking: Reported on 09/21/2021 12/13/20   Yetta Flock, MD  predniSONE (DELTASONE) 20 MG tablet Take 1 tablet by mouth in the morning and at bedtime. Patient not taking: Reported on 09/21/2021 08/14/21   [provider]   Current Facility-Administered Medications  Medication Dose Route Frequency Provider Last Rate Last Admin   acetaminophen (TYLENOL) tablet 650 mg  650 mg Oral Q6H PRN Lenore Cordia, MD       Or   acetaminophen (TYLENOL) suppository 650 mg   650 mg Rectal Q6H PRN Lenore Cordia, MD       aspirin EC tablet 81 mg  81 mg Oral Daily Patel, Vishal R, MD       atorvastatin (LIPITOR) tablet 80 mg  80 mg Oral Daily Patel, Vishal R, MD       bisacodyl (DULCOLAX) EC tablet 5 mg  5 mg Oral Daily PRN Lenore Cordia, MD   5 mg at 10/05/21 0012   Chlorhexidine Gluconate Cloth 2 % PADS 6 each  6 each Topical Q0600 [name], MD       clopidogrel (PLAVIX) tablet 75 mg  75 mg Oral Daily Zada Finders R, MD       gabapentin (NEURONTIN) capsule 100 mg  100 mg Oral TID Zada Finders R, MD   100 mg at 10/05/21 0012   heparin injection 5,000 Units  5,000 Units Subcutaneous Q8H Zada Finders R, MD   5,000 Units at 10/05/21 2244   HYDROmorphone (DILAUDID) injection 1 mg  1 mg Intravenous Q3H PRN Zada Finders R, MD   1 mg at 10/05/21 0016   insulin aspart (novoLOG) injection 0-5 Units  0-5 Units Subcutaneous QHS Patel, Vishal R, MD       insulin aspart (novoLOG) injection 0-9 Units  0-9 Units Subcutaneous TID WC Lenore Cordia, MD       linaclotide (LINZESS) capsule 72 mcg  72 mcg Oral QAC breakfast Zada Finders R, MD       ondansetron (ZOFRAN) tablet 4 mg  4 mg Oral Q6H PRN Lenore Cordia, MD       Or   ondansetron (ZOFRAN) injection 4 mg  4 mg Intravenous Q6H PRN Zada Finders R, MD   4 mg at 10/05/21 0016   oxyCODONE (Oxy IR/ROXICODONE) immediate release tablet 5 mg  5 mg Oral Q4H PRN Zada Finders R, MD   5 mg at 10/05/21 0615   pantoprazole (PROTONIX) EC tablet 40 mg  40 mg Oral BID Zada Finders R, MD   40 mg at 10/05/21 0012   piperacillin-tazobactam (ZOSYN) IVPB 3.375 g  3.375 g Intravenous Q6H Lavenia Atlas, RPH   Stopped at 10/05/21 0640   senna-docusate (Senokot-S) tablet 1 tablet  1 tablet Oral QHS PRN Lenore Cordia, MD       sodium  chloride flush (NS) 0.9 % injection 3 mL  3 mL Intravenous Q12H Lenore Cordia, MD       sucroferric oxyhydroxide [facility]) chewable tablet 500 mg  500 mg Oral TID WC Lenore Cordia, MD        [START ON 10/06/2021] vancomycin (VANCOCIN) IVPB 1000 mg/200 mL premix  1,000 mg Intravenous Q T,Th,Sa-HD Manc[place], Darnell Level, [facility]       Current Outpatient Medications  Medication Sig Dispense Refill   acetaminophen (TYLENOL) 500 MG tablet Take 500-1,000 mg by mouth every 6 (six) hours as needed for moderate pain or headache.     aspirin EC 81 MG tablet Take 1 tablet (81 mg total) by mouth daily. Swallow whole. 90 tablet 3   atorvastatin (LIPITOR) 80 MG tablet Take 1 tablet (80 mg total) by mouth daily. 90 tablet 3   bismuth subsalicylate (PEPTO BISMOL) 262 MG/15ML suspension Take 30 mLs by mouth every 6 (six) hours as needed for indigestion.     blood glucose meter kit and supplies Dispense based on patient and insurance preference. Use up to four times daily as directed. (FOR ICD-9 250.00, 250.01). 1 each 0   clopidogrel (PLAVIX) 75 MG tablet Take 1 tablet (75 mg total) by mouth daily. 30 tablet 11   gabapentin (NEURONTIN) 100 MG capsule Take 100 mg by mouth 3 (three) times daily.     HYDROcodone-acetaminophen (NORCO/VICODIN) 5-325 MG tablet Take 1 tablet by mouth every 6 (six) hours as needed for moderate pain.     ibuprofen (ADVIL) 200 MG tablet Take 600 mg by mouth every 6 (six) hours as needed for headache or moderate pain.     insulin aspart protamine - aspart (NOVOLOG MIX 70/30 FLEXPEN) (70-30) 100 UNIT/ML FlexPen Inject 0.3 mLs (30 Units total) into the skin 2 (two) times daily with a meal. (Patient taking differently: Inject 30 Units into the skin with breakfast, with lunch, and with evening meal.) 15 mL 0   Insulin Pen Needle 31G X 5 MM MISC Use daily to inject insulin as instructed 100 each 2   lidocaine-prilocaine (EMLA) cream Apply 1 application topically See admin instructions. Apply a small amount to access site (AVF) 1-2 hours before dialysis. Cover with occlusive dressing (saran wrap). Tuesday,Thursday,saturday     linaclotide (LINZESS) 72 MCG capsule Take 1 capsule (72 mcg total)  by mouth daily before breakfast. (Patient taking differently: Take 72 mcg by mouth daily as needed (constipation).) 8 capsule 0   pantoprazole (PROTONIX) 40 MG tablet Take 1 tablet (40 mg total) by mouth in the morning and at bedtime. 60 tablet 3   triamcinolone ointment (KENALOG) 0.1 % 1 application 2 (two) times daily.     VELPHORO 500 MG chewable tablet Chew 500 mg by mouth 3 (three) times daily with meals.      linaclotide (LINZESS) 145 MCG CAPS capsule Take 1 capsule (145 mcg total) by mouth daily before breakfast. (Patient not taking: Reported on 09/21/2021) 8 capsule 0   polyethylene glycol (MIRALAX) 17 g packet Take 17 g by mouth daily. (Patient not taking: Reported on 09/21/2021) 14 each 0   predniSONE (DELTASONE) 20 MG tablet Take 1 tablet by mouth in the morning and at bedtime. (Patient not taking: Reported on 09/21/2021)     Labs: Basic Metabolic Panel: Recent Labs  Lab 10/04/21 0726 10/05/21 0500  NA 139 136  K 5.2* 7.4*  CL 93* 89*  CO2 24 19*  GLUCOSE 194* 113*  BUN 64* 78*  CREATININE  9.41* 10.99*  CALCIUM 9.1 9.2   Liver Function Tests: No results for input(s): AST, ALT, ALKPHOS, BILITOT, PROT, ALBUMIN in the last 168 hours. No results for input(s): LIPASE, AMYLASE in the last 168 hours. No results for input(s): AMMONIA in the last 168 hours. CBC: Recent Labs  Lab 10/04/21 0726 10/05/21 0500  WBC 8.5 9.7  NEUTROABS 6.1  --   HGB 10.2* 11.0*  HCT 31.9* 35.3*  MCV 97.0 98.9  PLT 209 220   CBG: Recent Labs  Lab 10/05/21 0012  GLUCAP 145*    Studies/Results: DG Hand Complete Left  Result Date: 10/04/2021 CLINICAL DATA:  Right finger pain and swelling. EXAM: LEFT HAND - COMPLETE 3+ VIEW COMPARISON:  None. FINDINGS: There is no evidence of fracture or dislocation. There is no evidence of arthropathy. Small defect is seen involving the distal tip of the fourth distal phalanx suggesting possible osteomyelitis. Vascular calcifications are noted. Large soft tissue  wound or ulceration is seen involving distal tip of fourth finger. IMPRESSION: Large soft tissue wound or ulceration is seen involving distal tip of fourth finger. Small defect is seen involving the distal tuft of the fourth distal phalanx suggesting possible osteomyelitis. Electronically Signed   By: Marijo Conception M.D.   On: 10/04/2021 10:17    Outpatient Dialysis Orders:  Center: [facility]  on TTS . 180NRe, 4 hours 30 min, BFR 425, DFR 700, 2K/2Ca, EDW 103kg, AVF 15g Heparin 8000 unit bolus with 2000 unit bolus mid HD Venofer 2m q week No ESA Hectorol 173m IV q HD [place]uva 1m86mV q HD Velphoro 1 tab PO with meals  Assessment/Plan:  Osteomyelitis of left hand: On empiric vancomyin and zosyn. Hand surgery planning to consult this MA Chest pain/Junctional rhythm: Known history of mobitz 1 AV block, cardiology consulted   ESRD:  On dialysis TTS, missed yesterday due to ED visit. HD today and will resume regular schedule tomorrow Hyperkalemia: 2/2 ESRD with missed HD yesterday and dietary indiscretions. Getting emergent HD this AM with low potassium bath.   Hypertension/volume: BP moderately elevated with trace edema on exam. UF goal 2L as tolerated.   Anemia: Hgb 11.0. No indication for ESA at this time, hold IV iron in setting of acute infection.   Metabolic bone disease: Calcium 9.2, follow phos and albumin levels. Continue  Nutrition:  Albumin pending, renal/carb modified diet needed  SamAnice PaganiniA-C 10/05/2021, 8:08 AM  [facility] Pager: [phone]

## 2021-10-05 NOTE — H&P (Signed)
Edward [name] is an 51 y.o. male.   Chief Complaint: gangrene left ring finger HPI: Patient is a 51 year old patient of Dr. Tawni Levy who is seen at the request of the emergency room.  He was originally seen by Dr. Leanora Cover on 01 October 2021 and noted to have a loose nail which was removed by the wound center.  He was originally scheduled to have surgery on the 13th by Dr. Leanora Cover but was unable to have it done at that time.  He has had necrosis of the tip of his finger the distal phalanx bone no exposed and increasing pain and discomfort.  This has occurred over the past week.  Initially he had pain at the tip of his finger it is now into the palm of his hand with swelling.  He skipped his dialysis dialysis yesterday due to the pain.  He was admitted for dialysis.  He has had a urology consult.  Has had increasing pain with pain and swelling into the palm.  He has coronary artery disease diabetes anemia and AV fistula left arm.  He has developed an area of open drainage on the index finger nail.  He was admitted overnight placed on vancomycin he has been on doxycycline.  He has had amputation of the legs.  Past Medical History:  Diagnosis Date   Allergy    Anemia    getting Venofer with dialysis   Anxiety    self reported, sometimes-situational anxiety   Arthritis    bilateral hands/LEFT hip   CHF (congestive heart failure) (HCC)    Chronic kidney disease    t th s dialysis- [place] ---ESRD   Complication of anesthesia    slow to go to sleep   Coronary artery disease    Depression    self reported, sometimes-situational depression   Diabetes mellitus    type II- on meds   Dyspnea    sometimes- fluid   GERD (gastroesophageal reflux disease)    on meds   Hypertension    no longer on medication since starting on Hemodialysis   Myocardial infarction [facility]) 2019   Sleep apnea     Past Surgical History:  Procedure Laterality Date   A/V FISTULAGRAM Left 03/06/2020    Procedure: A/V FISTULAGRAM;  Surgeon: Waynetta Sandy, MD;  Location: [place] [facility];  Service: Cardiovascular;  Laterality: Left;   ABDOMINAL AORTOGRAM W/LOWER EXTREMITY N/A 04/09/2021   Procedure: ABDOMINAL AORTOGRAM W/LOWER EXTREMITY;  Surgeon: Waynetta Sandy, MD;  Location: [place] [facility];  Service: Cardiovascular;  Laterality: N/A;   AMPUTATION Left 05/10/2016   Procedure: Left Transmetatarsal Amputation;  Surgeon: [name], MD;  Location: [place];  Service: Orthopedics;  Laterality: Left;   AMPUTATION Right 05/16/2018   Procedure: PARTIAL RAY AMPUTATION FIFTH TOE RIGHT FOOT;  Surgeon: [name], DPM;  Location: [place];  Service: Podiatry;  Laterality: Right;   AMPUTATION Right 06/17/2018   Procedure: AMPUTATION 4th RAY Right Foot;  Surgeon: Trula Slade, DPM;  Location: [place];  Service: Podiatry;  Laterality: Right;   AMPUTATION Right 06/19/2018   Procedure: RIGHT BELOW KNEE AMPUTATION;  Surgeon: [name], MD;  Location: [place];  Service: Orthopedics;  Laterality: Right;   APPLICATION OF WOUND VAC Right 06/19/2018   Procedure: APPLICATION OF WOUND VAC;  Surgeon: [name], MD;  Location: [place];  Service: Orthopedics;  Laterality: Right;   AV FISTULA PLACEMENT Left 11/25/2019   Procedure: LEFT  ARM Brachiocephalic  ARTERIOVENOUS (AV) FISTULA CREATION;  Surgeon: Rosetta Posner, MD;  Location: [place];  Service: Vascular;  Laterality: Left;   [place] Left 07/19/2020   Procedure: LEFT SECOND STAGE [place];  Surgeon: Waynetta Sandy, MD;  Location: [place];  Service: Vascular;  Laterality: Left;   CARDIAC CATHETERIZATION  07/29/2018   CIRCUMCISION  09/02/2011   Procedure: CIRCUMCISION ADULT;  Surgeon: Molli Hazard, MD;  Location: [place];  Service: Urology;  Laterality: N/A;   CORONARY STENT INTERVENTION N/A 07/29/2018   Procedure: CORONARY STENT INTERVENTION;  Surgeon: Leonie Man, MD;  Location: [place] [facility];  Service: Cardiovascular;  Laterality: N/A;   I & D EXTREMITY  09/02/2011   Procedure: IRRIGATION AND DEBRIDEMENT EXTREMITY;  Surgeon: Theodoro Kos, DO;  Location: [place];  Service: Plastics;  Laterality: N/A;   INCISION AND DRAINAGE OF WOUND  08/12/2011   Procedure: IRRIGATION AND DEBRIDEMENT WOUND;  Surgeon: Zenovia Jarred, MD;  Location: [place];  Service: General;;  perineum   INSERTION OF DIALYSIS CATHETER N/A 11/25/2019   Procedure: INSERTION OF Righ Internal Jugular DIALYSIS CATHETER;  Surgeon: Rosetta Posner, MD;  Location: [place];  Service: Vascular;  Laterality: N/A;   [place]  08/12/2011   Procedure: IRRIGATION AND DEBRIDEMENT ABSCESS;  Surgeon: Molli Hazard, MD;  Location: [place];  Service: Urology;  Laterality: N/A;  irrigation and debridment perineum     IRRIGATION AND DEBRIDEMENT ABSCESS  08/14/2011   Procedure: MINOR INCISION AND DRAINAGE OF ABSCESS;  Surgeon: Molli Hazard, MD;  Location: [place];  Service: Urology;  Laterality: N/A;  Perineal Wound Debridement;Placement Bilateral Testicular Thigh Pouches   LEFT HEART CATH AND CORONARY ANGIOGRAPHY N/A 07/29/2018   Procedure: LEFT HEART CATH AND CORONARY ANGIOGRAPHY;  Surgeon: Leonie Man, MD;  Location: [place] [facility];  Service: Cardiovascular;  Laterality: N/A;   PERIPHERAL VASCULAR ATHERECTOMY Left 04/09/2021   Procedure: PERIPHERAL VASCULAR ATHERECTOMY;  Surgeon: Waynetta Sandy, MD;  Location: [place] [facility];  Service: Cardiovascular;  Laterality: Left;  TP trunk and peroneal arteries   PERIPHERAL VASCULAR BALLOON ANGIOPLASTY Left 04/09/2021   Procedure: PERIPHERAL VASCULAR BALLOON ANGIOPLASTY;  Surgeon: Waynetta Sandy, MD;  Location: [place] [facility];  Service: Cardiovascular;  Laterality: Left;  Popliteal   REVISON OF ARTERIOVENOUS FISTULA Left 05/24/2020   Procedure: LEFT ARM ARTERIOVENOUS  FISTULA  CONVERSION TO BASILIC VEIN FISTULA;  Surgeon: Waynetta Sandy, MD;  Location: [place];  Service: Vascular;  Laterality: Left;   TOTAL HIP ARTHROPLASTY Left 10/11/2016   Procedure: LEFT TOTAL HIP ARTHROPLASTY ANTERIOR APPROACH;  Surgeon: Mcarthur Rossetti, MD;  Location: [facility];  Service: Orthopedics;  Laterality: Left;    Family History  Problem Relation Age of Onset   Diabetes Other    Pancreatic cancer Mother 81   Colon cancer Neg Hx    Colon polyps Neg Hx    Esophageal cancer Neg Hx    Rectal cancer Neg Hx    Stomach cancer Neg Hx    Social History:  reports that he quit smoking about 22 months ago. His smoking use included cigarettes. He has a 6.00 pack-year smoking history. He has never used smokeless tobacco. He reports that he does not currently use alcohol. He reports that he does not use drugs.  Allergies:  Allergies  Allergen Reactions   Crestor [Rosuvastatin] Rash    Also had a  red eye   Benadryl [Diphenhydramine]     (Not in a hospital admission)   Results for orders placed or performed during the hospital encounter of 10/04/21 (from the past 48 hour(s))  Troponin I (High Sensitivity)     Status: Abnormal   Collection Time: 10/04/21 12:05 AM  Result Value Ref Range   Troponin I (High Sensitivity) 128 (HH) <18 ng/L    Comment: CRITICAL VALUE NOTED.  VALUE IS CONSISTENT WITH PREVIOUSLY REPORTED AND CALLED VALUE. (NOTE) Elevated high sensitivity troponin I (hsTnI) values and significant  changes across serial measurements may suggest ACS but many other  chronic and acute conditions are known to elevate hsTnI results.  Refer to the Links section for chest pain algorithms and additional  guidance. Performed at [facility], [place] [address] [place], [place] [address]   CBC with Differential     Status: Abnormal   Collection Time: 10/04/21  7:26 AM  Result Value Ref Range   WBC 8.5 4.0 - 10.5 K/uL   RBC 3.29 (L) 4.22 - 5.81 MIL/uL    Hemoglobin 10.2 (L) 13.0 - 17.0 g/dL   HCT 31.9 (L) 39.0 - 52.0 %   MCV 97.0 80.0 - 100.0 fL   MCH 31.0 26.0 - 34.0 pg   MCHC 32.0 30.0 - 36.0 g/dL   RDW 17.0 (H) 11.5 - 15.5 %   Platelets 209 150 - 400 K/uL   nRBC 0.0 0.0 - 0.2 %   Neutrophils Relative % 72 %   Neutro Abs 6.1 1.7 - 7.7 K/uL   Lymphocytes Relative 11 %   Lymphs Abs 0.9 0.7 - 4.0 K/uL   Monocytes Relative 12 %   Monocytes Absolute 1.0 0.1 - 1.0 K/uL   Eosinophils Relative 3 %   Eosinophils Absolute 0.3 0.0 - 0.5 K/uL   Basophils Relative 1 %   Basophils Absolute 0.1 0.0 - 0.1 K/uL   Immature Granulocytes 1 %   Abs Immature Granulocytes 0.07 0.00 - 0.07 K/uL    Comment: Performed at [facility] [address] [place], [place] [address]  Basic metabolic panel     Status: Abnormal   Collection Time: 10/04/21  7:26 AM  Result Value Ref Range   Sodium 139 135 - 145 mmol/L   Potassium 5.2 (H) 3.5 - 5.1 mmol/L    Comment: NO VISIBLE HEMOLYSIS   Chloride 93 (L) 98 - 111 mmol/L   CO2 24 22 - 32 mmol/L   Glucose, Bld 194 (H) 70 - 99 mg/dL    Comment: Glucose reference range applies only to samples taken after fasting for at least 8 hours.   BUN 64 (H) 6 - 20 mg/dL   Creatinine, Ser 9.41 (H) 0.61 - 1.24 mg/dL   Calcium 9.1 8.9 - 10.3 mg/dL   GFR, Estimated 6 (L) >60 mL/min    Comment: (NOTE) Calculated using the CKD-EPI Creatinine Equation (2021)    Anion gap 22 (H) 5 - 15    Comment: Electrolytes repeated to confirm. Performed at [facility], [place] [address], [place], [place] [address]   Lactic acid, plasma     Status: None   Collection Time: 10/04/21  7:26 AM  Result Value Ref Range   Lactic Acid, Venous 1.5 0.5 - 1.9 mmol/L    Comment: Performed at [facility] [address], [place], [place] [address]  Troponin I (High Sensitivity)     Status: Abnormal   Collection Time: 10/04/21  8:01 PM  Result Value Ref Range   Troponin I (High Sensitivity) 118 (HH) <18 ng/L    Comment: CRITICAL  RESULT CALLED TO, READ BACK BY AND VERIFIED WITH: ALBERTO COSTALES RN 10/04/21 2116 M KOROLESKI (NOTE) Elevated high sensitivity troponin I (hsTnI) values and significant  changes across serial measurements may suggest ACS but many other  chronic and acute conditions are known to elevate hsTnI results.  Refer to the Links section for chest pain algorithms and additional  guidance. Performed at [facility], [place] [address], [place], [place] [address]   Resp Panel by RT-PCR (Flu A&B, Covid) Nasopharyngeal Swab     Status: None   Collection Time: 10/04/21 11:51 PM   Specimen: Nasopharyngeal Swab; Nasopharyngeal(NP) swabs in vial transport medium  Result Value Ref Range   SARS Coronavirus 2 by RT PCR NEGATIVE NEGATIVE    Comment: (NOTE) SARS-CoV-2 target nucleic acids are NOT DETECTED.  The SARS-CoV-2 RNA is generally detectable in upper respiratory specimens during the acute phase of infection. The lowest concentration of SARS-CoV-2 viral copies this assay can detect is 138 copies/mL. A negative result does not preclude SARS-Cov-2 infection and should not be used as the sole basis for treatment or other patient management decisions. A negative result may occur with  improper specimen collection/handling, submission of specimen other than nasopharyngeal swab, presence of viral mutation(s) within the areas targeted by this assay, and inadequate number of viral copies(<138 copies/mL). A negative result must be combined with clinical observations, patient history, and epidemiological information. The expected result is Negative.  Fact Sheet for Patients:  EntrepreneurPulse.com.au  Fact Sheet for Healthcare Providers:  IncredibleEmployment.be  This test is no t yet approved or cleared by the [place] FDA and  has been authorized for detection and/or diagnosis of SARS-CoV-2 by FDA under an Emergency Use Authorization (EUA). This EUA will  remain  in effect (meaning this test can be used) for the duration of the COVID-19 declaration under Section 564(b)(1) of the Act, 21 U.S.C.section 360bbb-3(b)(1), unless the authorization is terminated  or revoked sooner.       Influenza A by PCR NEGATIVE NEGATIVE   Influenza B by PCR NEGATIVE NEGATIVE    Comment: (NOTE) The Xpert Xpress SARS-CoV-2/FLU/RSV plus assay is intended as an aid in the diagnosis of influenza from Nasopharyngeal swab specimens and should not be used as a sole basis for treatment. Nasal washings and aspirates are unacceptable for Xpert Xpress SARS-CoV-2/FLU/RSV testing.  Fact Sheet for Patients: EntrepreneurPulse.com.au  Fact Sheet for Healthcare Providers: IncredibleEmployment.be  This test is not yet approved or cleared by the [place] FDA and has been authorized for detection and/or diagnosis of SARS-CoV-2 by FDA under an Emergency Use Authorization (EUA). This EUA will remain in effect (meaning this test can be used) for the duration of the COVID-19 declaration under Section 564(b)(1) of the Act, 21 U.S.C. section 360bbb-3(b)(1), unless the authorization is terminated or revoked.  Performed at [facility], [place] [address] [place], [place] [address]   CBG monitoring, ED     Status: Abnormal   Collection Time: 10/05/21 12:12 AM  Result Value Ref Range   Glucose-Capillary 145 (H) 70 - 99 mg/dL    Comment: Glucose reference range applies only to samples taken after fasting for at least 8 hours.  HIV Antibody (routine testing w rflx)     Status: None   Collection Time: 10/05/21  5:00 AM  Result Value Ref Range   HIV Screen  4th Generation wRfx Non Reactive Non Reactive    Comment: Performed at [facility], [place] [address] [place], [place] [address]  Magnesium     Status: Abnormal   Collection Time: 10/05/21  5:00 AM  Result Value Ref Range   Magnesium 2.5 (H) 1.7 - 2.4 mg/dL    Comment:  Performed at [facility] [address], [place], [place] [address]  Basic metabolic panel     Status: Abnormal   Collection Time: 10/05/21  5:00 AM  Result Value Ref Range   Sodium 136 135 - 145 mmol/L   Potassium 7.4 (HH) 3.5 - 5.1 mmol/L    Comment: NO VISIBLE HEMOLYSIS CRITICAL RESULT CALLED TO, READ BACK BY AND VERIFIED WITH: JEREMY [facility] RN 10/05/21 0635 M KOROLESKI    Chloride 89 (L) 98 - 111 mmol/L   CO2 19 (L) 22 - 32 mmol/L   Glucose, Bld 113 (H) 70 - 99 mg/dL    Comment: Glucose reference range applies only to samples taken after fasting for at least 8 hours.   BUN 78 (H) 6 - 20 mg/dL   Creatinine, Ser 10.99 (H) 0.61 - 1.24 mg/dL   Calcium 9.2 8.9 - 10.3 mg/dL   GFR, Estimated 5 (L) >60 mL/min    Comment: (NOTE) Calculated using the CKD-EPI Creatinine Equation (2021)    Anion gap 28 (H) 5 - 15    Comment: Performed at [facility] [address] [place], [place] [address]  CBC     Status: Abnormal   Collection Time: 10/05/21  5:00 AM  Result Value Ref Range   WBC 9.7 4.0 - 10.5 K/uL   RBC 3.57 (L) 4.22 - 5.81 MIL/uL   Hemoglobin 11.0 (L) 13.0 - 17.0 g/dL   HCT 35.3 (L) 39.0 - 52.0 %   MCV 98.9 80.0 - 100.0 fL   MCH 30.8 26.0 - 34.0 pg   MCHC 31.2 30.0 - 36.0 g/dL   RDW 16.9 (H) 11.5 - 15.5 %   Platelets 220 150 - 400 K/uL   nRBC 0.2 0.0 - 0.2 %    Comment: Performed at [facility], [place] [address], [place], [place] [address]  Hemoglobin A1c     Status: Abnormal   Collection Time: 10/05/21  5:00 AM  Result Value Ref Range   Hgb A1c MFr Bld 8.6 (H) 4.8 - 5.6 %    Comment: (NOTE) Pre diabetes:          5.7%-6.4%  Diabetes:              >6.4%  Glycemic control for   <7.0% adults with diabetes    Mean Plasma Glucose 200.12 mg/dL    Comment: Performed at [facility] [address] [place], [place] [address]    DG Hand Complete Left  Result Date: 10/04/2021 CLINICAL DATA:  Right finger pain and swelling. EXAM: LEFT HAND -  COMPLETE 3+ VIEW COMPARISON:  None. FINDINGS: There is no evidence of fracture or dislocation. There is no evidence of arthropathy. Small defect is seen involving the distal tip of the fourth distal phalanx suggesting possible osteomyelitis. Vascular calcifications are noted. Large soft tissue wound or ulceration is seen involving distal tip of fourth finger. IMPRESSION: Large soft tissue wound or ulceration is seen involving distal tip of fourth finger. Small defect is seen involving the distal tuft of the fourth distal phalanx suggesting possible osteomyelitis. Electronically Signed   By: Sabino Dick  Jr M.D.   On: 10/04/2021 10:17     Pertinent items are noted in HPI.  Blood pressure (!) 169/88, pulse 83, temperature 98.1 F (36.7 C), temperature source Oral, resp. rate 16, SpO2 98 %.  General appearance: alert, cooperative, and appears stated age Head: Normocephalic, without obvious abnormality Neck: no JVD Resp: clear to auscultation bilaterally Cardio: regular rate and rhythm, S1, S2 normal, no murmur, click, rub or gallop GI: soft, non-tender; bowel sounds normal; no masses,  no organomegaly Extremities: gangrene left ring finger open nail fold index finger Pulses: 2+ and symmetric Skin: Skin color, texture, turgor normal. No rashes or lesions or gangrene left ring and exposed bone Neurologic: Grossly normal Incision/Wound: Open tip left ring finger  Assessment/Plan Infection left ring finger with osteomyelitis swelling to the palm of the hand with pain and paronychia index finger left hand Plan we recommend amputation left ring finger.  We have discussed level of amputation he would like to maintain as much length as possible but with the swelling pain to the palm of the hand we would recommend disarticulation at the metacarpal phalangeal joint.  He is aware there is no guarantee to the surgery possibility of infection recurrence injury to arteries nerves tendons complete relief  symptoms dystrophy possibility of surgery for further problems on that finger is aware that the wounds will be left open  Daryll Brod 10/05/2021, 12:42 PM

## 2021-10-05 NOTE — Significant Event (Addendum)
Pt with BP drop from 931P systolic to 90 systolic, HR dropped from 80 NSR to 48 junctional rhythm.  He says his heart does this some times.  Review of 2/3 cards office visit: dont see where they mention junctional rhythm, though they do mention intermittent Mobitz 1 2nd degree block.  Also note that he has trouble with hypotension on dialysis days.  Pts only complaints at the moment are: 1) feels constipated 2) L side of body is numb (he attributes to anesthesia)  Will order BMP and Mg to make sure electrolytes okay.  Curb siding neurology: understand why his L arm is numb post anesthesia, but not clear why a supraclavicular nerve block should be causing L leg numbness and weakness.  Calling code stroke because neurology cant really explain this either and Dr. Lorrin Goodell recommends it.  As far as the Junctional rhythm and SBP of 90: dont think this is at all related to the L sided weakness, his L sided symptoms were ongoing after surgery including when his SBP was 140 and HR was 80 in NSR.  Will just get BMP, Mg, and monitor for the moment.  Update: Dr. Lorrin Goodell and rapid response saw as code stroke.  No LVO, NIH score of 1, not candidate for TPA, recd getting MRI, get neuro consult if MRI brain shows stroke.

## 2021-10-05 NOTE — Anesthesia Procedure Notes (Signed)
Anesthesia Regional Block: Supraclavicular block   Pre-Anesthetic Checklist: , timeout performed,  Correct Patient, Correct Site, Correct Laterality,  Correct Procedure, Correct Position, site marked,  Risks and benefits discussed,  Surgical consent,  Pre-op evaluation,  At surgeon's request and post-op pain management  Laterality: Left  Prep: chloraprep       Needles:  Injection technique: Single-shot  Needle Type: Echogenic Stimulator Needle     Needle Length: 9cm  Needle Gauge: 21     Additional Needles:   Procedures:,,,, ultrasound used (permanent image in chart),,    Narrative:  Start time: 10/05/2021 5:10 PM End time: 10/05/2021 5:20 PM Injection made incrementally with aspirations every 5 mL.  Performed by: Personally  Anesthesiologist: Murvin Natal, MD  Additional Notes: Functioning IV was confirmed and monitors were applied.  A timeout was performed. Sterile prep, hand hygiene and sterile gloves were used. A 4mm 21ga Arrow echogenic stimulator needle was used. Negative aspiration and negative test dose prior to incremental administration of local anesthetic. The patient tolerated the procedure well.  Ultrasound guidance: relevent anatomy identified, needle position confirmed, local anesthetic spread visualized around nerve(s), vascular puncture avoided.  Image printed for medical record.

## 2021-10-05 NOTE — ED Notes (Signed)
Patient complaining of profusely itching.  He states that he would like something to help with the itching.

## 2021-10-05 NOTE — Progress Notes (Signed)
Patient admitted for pain control due to ischemic necrosis left ring finger and to a lesser extent the left index finger. Also with elevated potassium. Missed usual dialysis on Thursday 10/04/21 (T, TH ,Sat dialysis). Currently scheduled for revision  /amputation on [place] 10/12/21. Pain level has increased over the past week. Patient using Norco for pain control. If possible may need to proceed with surgery today or tomorrow. Will consult anesthesia for surgery risk assessment. Patient seen by cardiology. Will await their recommendations.Marily Lente Shanice Poznanski PA-C. [facility] [employer] [facility] [place]

## 2021-10-05 NOTE — Significant Event (Signed)
Rapid Response Event Note   Reason for Call :  L leg weakness s/p surgery. ? Need to call a Code Stroke.   Initial Focused Assessment:  Pt lying in bed with eyes open. He is alert and oriented. He is frustrated because he wants to get up but can't.  NIH-1 for L leg drift. LSN-prior to surgery. Surgery OP note done at 1819.  HR-48, BP-90/54, RR-18, SpO2-99% on 2L [place].   Interventions:  MRI brain NPO until pt passes [facility] swallow screen Plan of Care:  Code Stroke not initiated d/t LSN >4.5 hours and pt showing no LVO signs. Pt to get MRI. If + for stroke, plan for neuro MD to be consulted. Please make sure pt passes [facility] swallow screen prior to administering POs. Call RRT if further assistance needed.   Event Summary:   MD Notified: Dr. Alcario Drought at bedside on RRT arrival Call [facility], Dmani Mizer Anderson, RN

## 2021-10-05 NOTE — Transfer of Care (Signed)
Immediate Anesthesia Transfer of Care Note  Patient: CLARANCE BOLLARD  Procedure(s) Performed: LEFT RING FINGER AMPUTATION (Left)  Patient Location: PACU  Anesthesia Type:MAC combined with regional for post-op pain  Level of Consciousness: drowsy, patient cooperative and responds to stimulation  Airway & Oxygen Therapy: Patient Spontanous Breathing and Patient connected to face mask oxygen  Post-op Assessment: Report given to RN, Post -op Vital signs reviewed and stable and Patient moving all extremities  Post vital signs: Reviewed  Last Vitals:  Vitals Value Taken Time  BP 176/82 10/05/21 1825  Temp 98.5   Pulse 256 10/05/21 1825  Resp 17 10/05/21 1826  SpO2 88 % 10/05/21 1825  Vitals shown include unvalidated device data.  Last Pain:  Vitals:   10/05/21 1720  TempSrc:   PainSc: 0-No pain      Patients Stated Pain Goal: 0 (00/34/96 1164)  Complications: No notable events documented.

## 2021-10-05 NOTE — Op Note (Signed)
NAME: Edward [name] MEDICAL RECORD NO: [id number] DATE OF BIRTH: 11-Oct-1970 FACILITY: [facility] LOCATION: [facility] OR PHYSICIAN: Wynonia Sours, MD   OPERATIVE REPORT   DATE OF PROCEDURE: 10/05/21    PREOPERATIVE DIAGNOSIS: Infected gangrene left ring finger   POSTOPERATIVE DIAGNOSIS: Same with flexor sheath cyst infection   PROCEDURE: Amputation metacarpal phalangeal joint left ring finger   SURGEON: Daryll Brod, M.D.   ASSISTANT: none   ANESTHESIA:  Regional with sedation   INTRAVENOUS FLUIDS:  Per anesthesia flow sheet.   ESTIMATED BLOOD LOSS:  Minimal.   COMPLICATIONS:  None.   SPECIMENS: Cultures and finger   TOURNIQUET TIME:    Total Tourniquet Time Documented: Forearm (Left) - 24 minutes Total: Forearm (Left) - 24 minutes    DISPOSITION:  Stable to PACU.   INDICATIONS: Patient is a 51 year old male with a history of gangrene of his left ring finger.  He has had increasing pain and swelling along the entire flexor aspect of the finger with necrosis exposed bone distally.  He is a renal dialysis patient.  The problem has massively increased over the past 5 days.  He was admitted for dialysis and cardiac consultation.  We have discussed the amputation of his finger with the probability of leaving this open.  Prepare postoperative course been discussed along with risks and complications he is aware that there is no guarantee that surgery the possibility of further surgery being necessary continuation of the infection.  Further loss of fingers.  The preoperative area the patient is seen the extremity marked by both patient and surgeon antibiotic has been given.  He has been on doxycycline vancomycin and Keflex.  A supraclavicular block was carried out without difficulty under the direction the anesthesia department.  OPERATIVE COURSE: Patient is brought to the operating room placed in the supine position with the left arm free.  Prep was done with Betadine scrub and solution.  A  timeout was taken confirm patient and procedure.  A forearm tourniquet was used well distal to his vascular access.  This was inflated to 250 mmHg.  A longitudinal incision was made on the dorsal aspect of the finger after expressing pus from the distal margin of his necrotic finger.  Cultures were taken for both aerobic and anaerobic cultures.  The incision was then brought around the volar aspect carrying it distally over the proximal phalanx.  Mild bleeding was present.  The extensor tendons were cut allowing visualization of the metacarpal phalangeal joint.  The neural ligaments were incised.  The dissection was carried palmarly the neurovascular bundles were identified and flexor tendons were cut pus immediately expressed from it proximally.  The neurovascular bundles were then cut the nerves cut back proximally after cauterization the arteries were identified and tied with 4-0 chromic sutures was minimal bleeding.  The flexor tendons were prepped after grasping these with hemostats to allow them to be brought distally the wound was copiously irrigated with saline with pus from the flexor sheath proximally.  Irrigation was done with a full liter of saline.  This was done until clear.  The wound was then packed with iodoform gauze the longitudinal incision over the dorsal aspect of the metacarpal head was closed with interrupted 4 nylon sutures.  A sterile compressive dressing was applied.  Deflated deflation of the tourniquet remaining fingers regained turgor.  He is presently admitted to the hospital.  He will be maintained on antibiotics with dialysis again tomorrow.  He will be discharged home to  return to the [facility] [place] by next week.   Daryll Brod, MD Electronically signed, 10/05/21

## 2021-10-05 NOTE — Anesthesia Postprocedure Evaluation (Signed)
Anesthesia Post Note  Patient: Edward [name]  Procedure(s) Performed: LEFT RING FINGER AMPUTATION (Left: Finger)     Patient location during evaluation: PACU Anesthesia Type: MAC Level of consciousness: awake and alert Pain management: pain level controlled Vital Signs Assessment: post-procedure vital signs reviewed and stable Respiratory status: spontaneous breathing, nonlabored ventilation, respiratory function stable and patient connected to nasal cannula oxygen Cardiovascular status: stable and blood pressure returned to baseline Postop Assessment: no apparent nausea or vomiting Anesthetic complications: no   No notable events documented.  Last Vitals:  Vitals:   10/05/21 1910 10/05/21 1925  BP: 135/68 137/81  Pulse: 83 83  Resp: 12 14  Temp:    SpO2: 97% 93%    Last Pain:  Vitals:   10/05/21 1925  TempSrc:   PainSc: 0-No pain                 Kilah Drahos S

## 2021-10-05 NOTE — ED Notes (Signed)
Pt wounds to left lower leg dressed with nonadherent dressing and curlex. Provided with sock for securement.

## 2021-10-05 NOTE — Discharge Instructions (Signed)

## 2021-10-05 NOTE — Op Note (Signed)
Procedure Amputation ring finger Findings grossly infected Packed Cultures taken May be discharged post dialysis Return to office Monday for therapy If questions [phone] leave voice mail if no ans

## 2021-10-05 NOTE — ED Notes (Signed)
Pt arrived to unit from HD

## 2021-10-05 NOTE — Progress Notes (Signed)
° ° °  Patient seen in HD. Bedside monitor showed sinus rhythm. Likely that his junctional rhythm noted yesterday was secondary to elevated K, improving with dialysis. Patient denies any chest pain, palpitations, SOB.   See Consult note from 2/16 by Dr. Gasper Sells for preoperative risk assessment.  Per discussion with Dr. Sallyanne Kuster, as his junctional rhythm improved with dialysis, cardiology will sign off. Please feel free to contact us with any further questions.    Margie Billet, PA-C 10/05/2021 11:02 AM

## 2021-10-05 NOTE — Anesthesia Preprocedure Evaluation (Addendum)
Anesthesia Evaluation  Patient identified by MRN, date of birth, ID band Patient awake    Reviewed: Allergy & Precautions, NPO status , Patient's Chart, lab work & pertinent test results  Airway Mallampati: III  TM Distance: >3 FB Neck ROM: Full    Dental no notable dental hx.    Pulmonary sleep apnea , former smoker,    Pulmonary exam normal        Cardiovascular hypertension, + CAD, + Past MI, + Peripheral Vascular Disease and +CHF  Normal cardiovascular exam  '22 ECHO: Abnormal septal motion Distal septal / apical hypokinesis . EF 50 to 55% with low normal LV function. LV internal cavity size was mildly dilated,  mild LVH.  Grade II DD, restricted post leaflet of the MV, mod-severe MR  09/2020 Stress Dobutamine: negative stress echocardiogram for ischemia.   This is a low risk study.   10/04/2021 Cards consult: He has CAD, CHF,  prior TIA, DM on insulin, Scr >2 putting him at high risk surgical category for all procedures - In discussion with Dr. Loletha Grayer he is planned for time sensitive hand surgery, I have discussed with patient that he is at high risk of post of MI but that we have limited options for mitigation (in the setting of conduction disease, lexi scan is sub-optimal, he cannot walk, he prior stent size of 2.5 mm precluded CCTA, and his conduction disease and  orthostatic hypotension with HD limits medical management - it is reassuring that his chest pain feels much different that his prior angina - if his surgery is changed to elective dobutamine stress echo would be worth of consideration vs LHC. - HF is well compensated   Neuro/Psych PSYCHIATRIC DISORDERS Anxiety Depression TIA Neuromuscular disease    GI/Hepatic GERD  Medicated and Controlled,  Endo/Other  diabetes (glu 145), Insulin Dependent  Renal/GU ESRF and DialysisRenal disease ([facility] dialysis, K+ 7.4)     Musculoskeletal  (+) Arthritis ,   Abdominal    Peds  Hematology  (+) Blood dyscrasia (Hb 11.0), anemia ,   Anesthesia Other Findings LEFT RING FINGER NECROSIS/ISCHEMIA  Reproductive/Obstetrics                            Anesthesia Physical Anesthesia Plan  ASA: 4  Anesthesia Plan: Regional   Post-op Pain Management:    Induction: Intravenous  PONV Risk Score and Plan: 1 and Propofol infusion and Treatment may vary due to age or medical condition  Airway Management Planned: Simple Face Mask  Additional Equipment:   Intra-op Plan:   Post-operative Plan:   Informed Consent: I have reviewed the patients History and Physical, chart, labs and discussed the procedure including the risks, benefits and alternatives for the proposed anesthesia with the patient or authorized representative who has indicated his/her understanding and acceptance.     Dental advisory given  Plan Discussed with: CRNA, Anesthesiologist and Surgeon  Anesthesia Plan Comments: (Plan follow cardiology guidelines: if urgent/emergent, plan routine monitors, regional once K+ corrected, otherwise as per cards, pt will need pre-op Stress and or Cath)       Anesthesia Quick Evaluation

## 2021-10-06 ENCOUNTER — Inpatient Hospital Stay (HOSPITAL_COMMUNITY): Payer: Medicare Other

## 2021-10-06 ENCOUNTER — Encounter (HOSPITAL_COMMUNITY): Payer: Self-pay | Admitting: Orthopedic Surgery

## 2021-10-06 LAB — CBC WITH DIFFERENTIAL/PLATELET
Abs Immature Granulocytes: 0.12 10*3/uL — ABNORMAL HIGH (ref 0.00–0.07)
Basophils Absolute: 0.1 10*3/uL (ref 0.0–0.1)
Basophils Relative: 1 %
Eosinophils Absolute: 0.2 10*3/uL (ref 0.0–0.5)
Eosinophils Relative: 2 %
HCT: 33.6 % — ABNORMAL LOW (ref 39.0–52.0)
Hemoglobin: 10.8 g/dL — ABNORMAL LOW (ref 13.0–17.0)
Immature Granulocytes: 1 %
Lymphocytes Relative: 11 %
Lymphs Abs: 1 10*3/uL (ref 0.7–4.0)
MCH: 30.9 pg (ref 26.0–34.0)
MCHC: 32.1 g/dL (ref 30.0–36.0)
MCV: 96.3 fL (ref 80.0–100.0)
Monocytes Absolute: 0.9 10*3/uL (ref 0.1–1.0)
Monocytes Relative: 10 %
Neutro Abs: 6.8 10*3/uL (ref 1.7–7.7)
Neutrophils Relative %: 75 %
Platelets: 218 10*3/uL (ref 150–400)
RBC: 3.49 MIL/uL — ABNORMAL LOW (ref 4.22–5.81)
RDW: 16.6 % — ABNORMAL HIGH (ref 11.5–15.5)
WBC: 9 10*3/uL (ref 4.0–10.5)
nRBC: 0.4 % — ABNORMAL HIGH (ref 0.0–0.2)

## 2021-10-06 LAB — BASIC METABOLIC PANEL
Anion gap: 23 — ABNORMAL HIGH (ref 5–15)
BUN: 44 mg/dL — ABNORMAL HIGH (ref 6–20)
CO2: 20 mmol/L — ABNORMAL LOW (ref 22–32)
Calcium: 9.2 mg/dL (ref 8.9–10.3)
Chloride: 91 mmol/L — ABNORMAL LOW (ref 98–111)
Creatinine, Ser: 7.62 mg/dL — ABNORMAL HIGH (ref 0.61–1.24)
GFR, Estimated: 8 mL/min — ABNORMAL LOW (ref >60)
Glucose, Bld: 169 mg/dL — ABNORMAL HIGH (ref 70–99)
Potassium: 4.8 mmol/L (ref 3.5–5.1)
Sodium: 134 mmol/L — ABNORMAL LOW (ref 135–145)

## 2021-10-06 LAB — RENAL FUNCTION PANEL
Albumin: 2.7 g/dL — ABNORMAL LOW (ref 3.5–5.0)
Anion gap: 24 — ABNORMAL HIGH (ref 5–15)
BUN: 54 mg/dL — ABNORMAL HIGH (ref 6–20)
CO2: 17 mmol/L — ABNORMAL LOW (ref 22–32)
Calcium: 8.9 mg/dL (ref 8.9–10.3)
Chloride: 91 mmol/L — ABNORMAL LOW (ref 98–111)
Creatinine, Ser: 8.49 mg/dL — ABNORMAL HIGH (ref 0.61–1.24)
GFR, Estimated: 7 mL/min — ABNORMAL LOW (ref >60)
Glucose, Bld: 142 mg/dL — ABNORMAL HIGH (ref 70–99)
Phosphorus: 9.2 mg/dL — ABNORMAL HIGH (ref 2.5–4.6)
Potassium: 5.1 mmol/L (ref 3.5–5.1)
Sodium: 132 mmol/L — ABNORMAL LOW (ref 135–145)

## 2021-10-06 LAB — CBC
HCT: 30.5 % — ABNORMAL LOW (ref 39.0–52.0)
Hemoglobin: 10 g/dL — ABNORMAL LOW (ref 13.0–17.0)
MCH: 31.6 pg (ref 26.0–34.0)
MCHC: 32.8 g/dL (ref 30.0–36.0)
MCV: 96.5 fL (ref 80.0–100.0)
Platelets: 182 10*3/uL (ref 150–400)
RBC: 3.16 MIL/uL — ABNORMAL LOW (ref 4.22–5.81)
RDW: 16.5 % — ABNORMAL HIGH (ref 11.5–15.5)
WBC: 8.4 10*3/uL (ref 4.0–10.5)
nRBC: 0.6 % — ABNORMAL HIGH (ref 0.0–0.2)

## 2021-10-06 LAB — MAGNESIUM: Magnesium: 2.2 mg/dL (ref 1.7–2.4)

## 2021-10-06 LAB — HEPATITIS B SURFACE ANTIGEN: Hepatitis B Surface Ag: NONREACTIVE

## 2021-10-06 LAB — GLUCOSE, CAPILLARY
Glucose-Capillary: 169 mg/dL — ABNORMAL HIGH (ref 70–99)
Glucose-Capillary: 115 mg/dL — ABNORMAL HIGH (ref 70–99)

## 2021-10-06 LAB — HEPATITIS B CORE ANTIBODY, TOTAL: Hep B Core Total Ab: NONREACTIVE

## 2021-10-06 NOTE — Discharge Summary (Signed)
PatientPhysician Discharge Summary  Edward [name] UYQ:[id number] DOB: 1971-02-22 DOA: 10/04/2021  PCP: [name], MD  Admit date: 10/04/2021 Discharge date: 10/06/2021 30 Day Unplanned Readmission Risk Score    Flowsheet Row ED to Hosp-Admission (Current) from 10/04/2021 in [facility] 4E CV SURGICAL PROGRESSIVE CARE  30 Day Unplanned Readmission Risk Score (%) 21 Filed at 10/06/2021 1200       This score is the patient's risk of an unplanned readmission within 30 days of being discharged (0 -100%). The score is based on dignosis, age, lab data, medications, orders, and past utilization.   Low:  0-14.9   Medium: 15-21.9   High: 22-29.9   Extreme: 30 and above          Admitted From: Home Disposition: Home  Recommendations for Outpatient Follow-up:  Follow up with PCP in 1-2 weeks Please obtain BMP/CBC in one week Follow-up with Dr. Fredna Dow on Monday. Please follow up with your PCP on the following pending results: Unresulted Labs (From admission, onward)     Start     Ordered   10/06/21 0851  Hepatitis B surface antibody  Once,   R       Question:  Specimen collection method  Answer:  Lab=Lab collect   10/06/21 0851   10/05/21 1806  Aerobic/Anaerobic Culture w Gram Stain (surgical/deep wound)  RELEASE UPON ORDERING,   TIMED       Comments: Specimen 1: Phone [phone]         Previous Biopsy:  no Is the patient on airborne/droplet precautions? No           Clinical History:  LEFT RING FINGER NECROSIS/ISCHEMIA Copy of Report to:  N/A Specimen Disposition: Delivered to Histology     10/05/21 1806              Home Health: None Equipment/Devices: None  Discharge Condition: Stable CODE STATUS: Full code Diet recommendation: Diabetic/cardiac  Subjective: Seen and examined.  He has no complaints.  He says that his left-sided numbness has resolved completely and he wants to go home.  Brief/Interim Summary: Edward [name] is a 51 y.o. male with medical history significant  for ESRD on TTS HD, CAD s/p PCI with DES (left circumflex 07/2018), chronic combined systolic and diastolic CHF (EF 33-29% November 2022), severe mitral and tricuspid valve regurgitation, PVD s/p right BKA, insulin-dependent diabetes, hyperlipidemia, Hx Mobitz 1 heart block, orthostatic hypotension with dialysis who presented to the ED for evaluation of worsening left fourth finger infection/wound.  Patient has been following with hand surgery, Dr. Leanora Cover, for possible carpal tunnel or ulnar nerve compression bilaterally.  He was seen in office on 10/01/2021 where he was noted to have necrosis of the distal left ring finger. Amputation was recommended and scheduled for 10/11/2021.  He was started on doxycycline as an outpatient.  Due to worsening pain and some serosanguineous type discharge, he decided to come to the ED.  While in the ED, patient reported left-sided chest pain described as a burning sensation.  This felt different than his prior heart attack.  He reports significant indigestion recently and is scheduled for EGD in April.  He was also scheduled for outpatient stress testing by his cardiologist, Dr. Stanford Breed, currently planned for 10/10/2021. Patient states he missed his dialysis today because he came to the ED early this morning.   Upon arrival to ED, initial vitals showed BP 99/54, pulse 48, RR 17, temp 97.7 F, SPO2 96% on room air.   Labs  show sodium 139, potassium 5.2, bicarb 24, BUN 64, creatinine 9.41, serum glucose 194, WBC 8.5, hemoglobin 10.2, platelets 209,000, lactic acid 1.5, troponin 118.    left hand x-ray shows large soft tissue wound or ulceration involving distal tip of fourth finger with possible osteomyelitis involving the distal tuft of the fourth distal phalanx.  He was started on Zosyn and vancomycin and Lokelma was given.  Patient was admitted to hospital service, hand surgery, nephrology was consulted.  Also, patient was found to have junctional rhythm in the ED,  cardiology was consulted.  Subsequently patient underwent amputation of metacarpophalangeal joint left ring finger by Dr. Daryll Brod on 10/05/2021, no postop antibiotics were recommended and he was cleared for discharge.  He also received his scheduled dialysis and his hyperkalemia resolved.  His chest pain also resolved completely, ACS was ruled out, troponin slightly elevated but flat, likely due to ESRD status.  His junctional rhythm also resolved with resolution of hyperkalemia, per cardiology, this was likely secondary to hyperkalemia so they signed off as well.  They recommended continuing DAPT.  Chronic combined systolic and diastolic CHF (congestive heart failure) ([place])- (present on admission) Last EF 40-45% November 2022 at [facility].  Compensated on admission.  Volume control with dialysis.   Diabetes mellitus with complication, with long-term current use of insulin [facility]): Resume home medications.   Anemia of chronic disease- (present on admission) Currently stable, continue to monitor.  Of note, last evening postoperatively, patient started complaining of left arm and left leg numbness.  Case was discussed by hospitalist with neurology, code stroke was called, CT head was done which was negative.  This was followed by MRI brain which showed questionable punctate subacute white matter lacunar infarct of the anterior right middle frontal gyrus which was not changed from the MRI done on 09/26/2020 and may instead be T2 shine through artifact.  I discussed with Dr. [name] of neurology who reviewed images by himself and he was not convinced that this was a stroke and patient symptoms had resolved as well and he was already on DAPT so there was nothing much that needed to be done.  I had requested PT OT assessment however patient declined PT assessment stating that he feels that he is at his baseline and prefers to go home.  Thus he is being discharged home in stable condition.  He is advised to follow-up with  Dr. Fredna Dow on Monday for wound therapy.  Discharge plan was discussed with patient and/or family member and they verbalized understanding and agreed with it.  Discharge Diagnoses:  Principal Problem:   Osteomyelitis of finger of left hand (HCC) Active Problems:   Diabetes mellitus with complication, with long-term current use of insulin (HCC)   Hyperkalemia   Anemia of chronic disease   Chronic combined systolic and diastolic CHF (congestive heart failure) (HCC)   CAD S/P percutaneous coronary angioplasty   ESRD on hemodialysis ([place])   Junctional rhythm    Discharge Instructions   Allergies as of 10/06/2021       Reactions   Crestor [rosuvastatin] Rash   Also had a red eye   Benadryl [diphenhydramine] Rash        Medication List     TAKE these medications    acetaminophen 500 MG tablet Commonly known as: TYLENOL Take 500-1,000 mg by mouth every 6 (six) hours as needed for moderate pain or headache.   aspirin EC 81 MG tablet Take 1 tablet (81 mg total) by mouth daily. Swallow  whole.   atorvastatin 80 MG tablet Commonly known as: LIPITOR Take 1 tablet (80 mg total) by mouth daily.   bismuth subsalicylate 735 HG/99ME suspension Commonly known as: PEPTO BISMOL Take 30 mLs by mouth every 6 (six) hours as needed for indigestion.   blood glucose meter kit and supplies Dispense based on patient and insurance preference. Use up to four times daily as directed. (FOR ICD-9 250.00, 250.01).   clopidogrel 75 MG tablet Commonly known as: Plavix Take 1 tablet (75 mg total) by mouth daily.   gabapentin 100 MG capsule Commonly known as: NEURONTIN Take 100 mg by mouth 3 (three) times daily.   HYDROcodone-acetaminophen 5-325 MG tablet Commonly known as: NORCO/VICODIN Take 1 tablet by mouth every 6 (six) hours as needed for moderate pain.   ibuprofen 200 MG tablet Commonly known as: ADVIL Take 600 mg by mouth every 6 (six) hours as needed for headache or moderate pain.    Insulin Pen Needle 31G X 5 MM Misc Use daily to inject insulin as instructed   lidocaine-prilocaine cream Commonly known as: EMLA Apply 1 application topically See admin instructions. Apply a small amount to access site (AVF) 1-2 hours before dialysis. Cover with occlusive dressing (saran wrap). Tuesday,Thursday,saturday   linaclotide 72 MCG capsule Commonly known as: LINZESS Take 1 capsule (72 mcg total) by mouth daily before breakfast. What changed:  when to take this reasons to take this   linaclotide 145 MCG Caps capsule Commonly known as: LINZESS Take 1 capsule (145 mcg total) by mouth daily before breakfast. What changed: Another medication with the same name was changed. Make sure you understand how and when to take each.   NovoLOG Mix 70/30 FlexPen (70-30) 100 UNIT/ML FlexPen Generic drug: insulin aspart protamine - aspart Inject 0.3 mLs (30 Units total) into the skin 2 (two) times daily with a meal. What changed: when to take this   pantoprazole 40 MG tablet Commonly known as: PROTONIX Take 1 tablet (40 mg total) by mouth in the morning and at bedtime.   polyethylene glycol 17 g packet Commonly known as: MiraLax Take 17 g by mouth daily.   predniSONE 20 MG tablet Commonly known as: DELTASONE Take 1 tablet by mouth in the morning and at bedtime.   triamcinolone ointment 0.1 % Commonly known as: KENALOG 1 application 2 (two) times daily.   Velphoro 500 MG chewable tablet Generic drug: sucroferric oxyhydroxide Chew 500 mg by mouth 3 (three) times daily with meals.        Follow-up Information     Daryll Brod, MD Follow up in 3 day(s).   Specialty: Orthopedic Surgery Contact information: [place] [address] [phone]         [name], MD Follow up in 1 week(s).   Specialty: Family Medicine Contact information: [address] [place] [address] [phone]         Lelon Perla, MD .   Specialty:  Cardiology Contact information: [address] [place] [address] [phone]                Allergies  Allergen Reactions   Crestor [Rosuvastatin] Rash    Also had a red eye   Benadryl [Diphenhydramine] Rash    Consultations: Nephrology, cardiology, hand surgery   Procedures/Studies: MR BRAIN WO CONTRAST  Result Date: 10/06/2021 CLINICAL DATA:  51 year old male dialysis patient. Neurologic deficit. EXAM: MRI HEAD WITHOUT CONTRAST TECHNIQUE: Multiplanar, multiecho pulse sequences of the brain  and surrounding structures were obtained without intravenous contrast. COMPARISON:  Brain MRI and head CT 09/26/2020. FINDINGS: Brain: Punctate area of abnormal diffusion anterior right middle frontal gyrus subcortical white matter (series 5 image 80), thought related to T2 shine through on the prior exam. Questionably restricted now. T2 and FLAIR hyperintensity there unchanged (series 10, image 16 and series 11, image 16). No other restricted diffusion. No midline shift, mass effect, evidence of mass lesion, ventriculomegaly, extra-axial collection or acute intracranial hemorrhage. Cervicomedullary junction and pituitary are within normal limits. Stable scattered T2 and FLAIR hyperintensity in the bilateral white matter, most pronounced left corona radiata associated with chronic lacunar infarct of the left external capsule. No cortical encephalomalacia or chronic cerebral blood products identified. Vascular: Major intracranial vascular flow voids are stable. Skull and upper cervical spine: Negative. Sinuses/Orbits: Stable, negative. Other: Mastoids remain clear. Grossly negative visible internal auditory structures. IMPRESSION: 1. Questionable punctate subacute white matter lacunar infarct of the anterior right middle frontal gyrus, not significantly changed since 09/26/2020 and may instead be T2 shine through artifact. 2. No other acute intracranial abnormality. Stable other chronic  small vessel disease, most pronounced at the left corona radiata/external capsule. Electronically Signed   By: Genevie Ann M.D.   On: 10/06/2021 04:59   DG Hand Complete Left  Result Date: 10/04/2021 CLINICAL DATA:  Right finger pain and swelling. EXAM: LEFT HAND - COMPLETE 3+ VIEW COMPARISON:  None. FINDINGS: There is no evidence of fracture or dislocation. There is no evidence of arthropathy. Small defect is seen involving the distal tip of the fourth distal phalanx suggesting possible osteomyelitis. Vascular calcifications are noted. Large soft tissue wound or ulceration is seen involving distal tip of fourth finger. IMPRESSION: Large soft tissue wound or ulceration is seen involving distal tip of fourth finger. Small defect is seen involving the distal tuft of the fourth distal phalanx suggesting possible osteomyelitis. Electronically Signed   By: Marijo Conception M.D.   On: 10/04/2021 10:17     Discharge Exam: Vitals:   10/06/21 1117 10/06/21 1210  BP: (!) 154/81 (!) 143/74  Pulse: 85 82  Resp: (!) 9 20  Temp: (!) 97.5 F (36.4 C) 98 F (36.7 C)  SpO2: 98% 98%   Vitals:   10/06/21 1030 10/06/21 1100 10/06/21 1117 10/06/21 1210  BP: 134/69 (!) 148/100 (!) 154/81 (!) 143/74  Pulse: 86 92 85 82  Resp:  17 (!) 9 20  Temp:   (!) 97.5 F (36.4 C) 98 F (36.7 C)  TempSrc:   Temporal Oral  SpO2:   98% 98%  Weight:   102.3 kg   Height:        General: Pt is alert, awake, not in acute distress Cardiovascular: RRR, S1/S2 +, no rubs, no gallops Respiratory: CTA bilaterally, no wheezing, no rhonchi Abdominal: Soft, NT, ND, bowel sounds + Extremities: Right BKA,    The results of significant diagnostics from this hospitalization (including imaging, microbiology, ancillary and laboratory) are listed below for reference.     Microbiology: Recent Results (from the past 240 hour(s))  Resp Panel by RT-PCR (Flu A&B, Covid) Nasopharyngeal Swab     Status: None   Collection Time: 10/04/21  11:51 PM   Specimen: Nasopharyngeal Swab; Nasopharyngeal(NP) swabs in vial transport medium  Result Value Ref Range Status   SARS Coronavirus 2 by RT PCR NEGATIVE NEGATIVE Final    Comment: (NOTE) SARS-CoV-2 target nucleic acids are NOT DETECTED.  The SARS-CoV-2 RNA is generally detectable in upper  respiratory specimens during the acute phase of infection. The lowest concentration of SARS-CoV-2 viral copies this assay can detect is 138 copies/mL. A negative result does not preclude SARS-Cov-2 infection and should not be used as the sole basis for treatment or other patient management decisions. A negative result may occur with  improper specimen collection/handling, submission of specimen other than nasopharyngeal swab, presence of viral mutation(s) within the areas targeted by this assay, and inadequate number of viral copies(<138 copies/mL). A negative result must be combined with clinical observations, patient history, and epidemiological information. The expected result is Negative.  Fact Sheet for Patients:  EntrepreneurPulse.com.au  Fact Sheet for Healthcare Providers:  IncredibleEmployment.be  This test is no t yet approved or cleared by the [place] FDA and  has been authorized for detection and/or diagnosis of SARS-CoV-2 by FDA under an Emergency Use Authorization (EUA). This EUA will remain  in effect (meaning this test can be used) for the duration of the COVID-19 declaration under Section 564(b)(1) of the Act, 21 U.S.C.section 360bbb-3(b)(1), unless the authorization is terminated  or revoked sooner.       Influenza A by PCR NEGATIVE NEGATIVE Final   Influenza B by PCR NEGATIVE NEGATIVE Final    Comment: (NOTE) The Xpert Xpress SARS-CoV-2/FLU/RSV plus assay is intended as an aid in the diagnosis of influenza from Nasopharyngeal swab specimens and should not be used as a sole basis for treatment. Nasal washings and aspirates  are unacceptable for Xpert Xpress SARS-CoV-2/FLU/RSV testing.  Fact Sheet for Patients: EntrepreneurPulse.com.au  Fact Sheet for Healthcare Providers: IncredibleEmployment.be  This test is not yet approved or cleared by the [place] FDA and has been authorized for detection and/or diagnosis of SARS-CoV-2 by FDA under an Emergency Use Authorization (EUA). This EUA will remain in effect (meaning this test can be used) for the duration of the COVID-19 declaration under Section 564(b)(1) of the Act, 21 U.S.C. section 360bbb-3(b)(1), unless the authorization is terminated or revoked.  Performed at [facility], [place] [address] [place], [place] [address]   Aerobic/Anaerobic Culture w Gram Stain (surgical/deep wound)     Status: None (Preliminary result)   Collection Time: 10/05/21  6:03 PM   Specimen: PATH Soft tissue  Result Value Ref Range Status   Specimen Description WOUND  Final   Special Requests SWAB OF TISSUE FROM LEFT FINGER AMPUTATION  Final   Gram Stain PENDING  Incomplete   Culture   Final    TOO YOUNG TO READ Performed at [facility], [place] [address], [place], [place] [address]    Report Status PENDING  Incomplete     Labs: BNP (last 3 results) No results for input(s): BNP in the last 8760 hours. Basic Metabolic Panel: Recent Labs  Lab 10/04/21 0726 10/05/21 0500 10/05/21 1650 10/05/21 2356 10/06/21 0702  NA 139 136 131* 134* 132*  K 5.2* 7.4* 3.5 4.8 5.1  CL 93* 89* 83* 91* 91*  CO2 24 19*  --  20* 17*  GLUCOSE 194* 113* 300* 169* 142*  BUN 64* 78* 29* 44* 54*  CREATININE 9.41* 10.99* 5.80* 7.62* 8.49*  CALCIUM 9.1 9.2  --  9.2 8.9  MG  --  2.5*  --  2.2  --   PHOS  --   --   --   --  9.2*   Liver Function Tests: Recent Labs  Lab 10/06/21 0702  ALBUMIN 2.7*   No results for input(s): LIPASE, AMYLASE in the last 168  hours. No results for input(s): AMMONIA in the last 168 hours. CBC: Recent Labs   Lab 10/04/21 0726 10/05/21 0500 10/05/21 1650 10/05/21 2356 10/06/21 0702  WBC 8.5 9.7  --  9.0 8.4  NEUTROABS 6.1  --   --  6.8  --   HGB 10.2* 11.0* 11.6* 10.8* 10.0*  HCT 31.9* 35.3* 34.0* 33.6* 30.5*  MCV 97.0 98.9  --  96.3 96.5  PLT 209 220  --  218 182   Cardiac Enzymes: No results for input(s): CKTOTAL, CKMB, CKMBINDEX, TROPONINI in the last 168 hours. BNP: Invalid input(s): POCBNP CBG: Recent Labs  Lab 10/05/21 1616 10/05/21 1830 10/05/21 2154 10/06/21 0558 10/06/21 1216  GLUCAP 119* 133* 185* 169* 115*   D-Dimer No results for input(s): DDIMER in the last 72 hours. Hgb A1c Recent Labs    10/05/21 0500  HGBA1C 8.6*   Lipid Profile No results for input(s): CHOL, HDL, LDLCALC, TRIG, CHOLHDL, LDLDIRECT in the last 72 hours. Thyroid function studies No results for input(s): TSH, T4TOTAL, T3FREE, THYROIDAB in the last 72 hours.  Invalid input(s): FREET3 Anemia work up No results for input(s): VITAMINB12, FOLATE, FERRITIN, TIBC, IRON, RETICCTPCT in the last 72 hours. Urinalysis    Component Value Date/Time   COLORURINE YELLOW 09/06/2021 0748   APPEARANCEUR CLEAR 09/06/2021 0748   LABSPEC >1.030 (H) 09/06/2021 0748   PHURINE 5.5 09/06/2021 0748   GLUCOSEU 100 (A) 09/06/2021 0748   HGBUR MODERATE (A) 09/06/2021 0748   BILIRUBINUR NEGATIVE 09/06/2021 0748   KETONESUR NEGATIVE 09/06/2021 0748   PROTEINUR >300 (A) 09/06/2021 0748   UROBILINOGEN 0.2 05/04/2014 1002   NITRITE NEGATIVE 09/06/2021 0748   LEUKOCYTESUR TRACE (A) 09/06/2021 0748   Sepsis Labs Invalid input(s): PROCALCITONIN,  WBC,  LACTICIDVEN Microbiology Recent Results (from the past 240 hour(s))  Resp Panel by RT-PCR (Flu A&B, Covid) Nasopharyngeal Swab     Status: None   Collection Time: 10/04/21 11:51 PM   Specimen: Nasopharyngeal Swab; Nasopharyngeal(NP) swabs in vial transport medium  Result Value Ref Range Status   SARS Coronavirus 2 by RT PCR NEGATIVE NEGATIVE Final    Comment:  (NOTE) SARS-CoV-2 target nucleic acids are NOT DETECTED.  The SARS-CoV-2 RNA is generally detectable in upper respiratory specimens during the acute phase of infection. The lowest concentration of SARS-CoV-2 viral copies this assay can detect is 138 copies/mL. A negative result does not preclude SARS-Cov-2 infection and should not be used as the sole basis for treatment or other patient management decisions. A negative result may occur with  improper specimen collection/handling, submission of specimen other than nasopharyngeal swab, presence of viral mutation(s) within the areas targeted by this assay, and inadequate number of viral copies(<138 copies/mL). A negative result must be combined with clinical observations, patient history, and epidemiological information. The expected result is Negative.  Fact Sheet for Patients:  EntrepreneurPulse.com.au  Fact Sheet for Healthcare Providers:  IncredibleEmployment.be  This test is no t yet approved or cleared by the [place] FDA and  has been authorized for detection and/or diagnosis of SARS-CoV-2 by FDA under an Emergency Use Authorization (EUA). This EUA will remain  in effect (meaning this test can be used) for the duration of the COVID-19 declaration under Section 564(b)(1) of the Act, 21 U.S.C.section 360bbb-3(b)(1), unless the authorization is terminated  or revoked sooner.       Influenza A by PCR NEGATIVE NEGATIVE Final   Influenza B by PCR NEGATIVE NEGATIVE Final    Comment: (NOTE) The Xpert Xpress SARS-CoV-2/FLU/RSV  plus assay is intended as an aid in the diagnosis of influenza from Nasopharyngeal swab specimens and should not be used as a sole basis for treatment. Nasal washings and aspirates are unacceptable for Xpert Xpress SARS-CoV-2/FLU/RSV testing.  Fact Sheet for Patients: EntrepreneurPulse.com.au  Fact Sheet for Healthcare  Providers: IncredibleEmployment.be  This test is not yet approved or cleared by the [place] FDA and has been authorized for detection and/or diagnosis of SARS-CoV-2 by FDA under an Emergency Use Authorization (EUA). This EUA will remain in effect (meaning this test can be used) for the duration of the COVID-19 declaration under Section 564(b)(1) of the Act, 21 U.S.C. section 360bbb-3(b)(1), unless the authorization is terminated or revoked.  Performed at [facility], [place] [address], [place], [place] [address]   Aerobic/Anaerobic Culture w Gram Stain (surgical/deep wound)     Status: None (Preliminary result)   Collection Time: 10/05/21  6:03 PM   Specimen: PATH Soft tissue  Result Value Ref Range Status   Specimen Description WOUND  Final   Special Requests SWAB OF TISSUE FROM LEFT FINGER AMPUTATION  Final   Gram Stain PENDING  Incomplete   Culture   Final    TOO YOUNG TO READ Performed at [facility], [place] [address] [place], [place] [address]    Report Status PENDING  Incomplete     Time coordinating discharge: Over 30 minutes  SIGNED:   Darliss Cheney, MD  [facility] 10/06/2021, 12:51 PM  If 7PM-7AM, please contact night-coverage [email or web address]

## 2021-10-06 NOTE — Progress Notes (Signed)
[facility] Progress Note    Assessment/ Plan:    Osteomyelitis of left hand: On empiric vancomyin and zosyn. S/p ring finger amputation 2/17 Chest pain/Junctional rhythm: Known history of mobitz 1 AV block, cardiology consulted  Left sided numbness: post op on 2/17. MRI: questionable punctate subacute white matter lacunar infarct of anterior right middle frontal gyrus--not sig changed since 09/2020 (possible artifact?). Per PMD  ESRD:  On dialysis TTS, HD on sched Hyperkalemia: 2/2 ESRD with missed HD yesterday and dietary indiscretions. K improved w/ HD  Hypertension/volume: UF as tolerated.   Anemia: Hgb 10.8. No indication for ESA at this time, hold IV iron in setting of acute infection.   Metabolic bone disease: follow phos and albumin levels. Continue w/ home binders  Nutrition:  check alb, renal/carb modified diet needed  Outpatient Dialysis Orders:  Center: [facility]  on TTS . 180NRe, 4 hours 30 min, BFR 425, DFR 700, 2K/2Ca, EDW 103kg, AVF 15g Heparin 8000 unit bolus with 2000 unit bolus mid HD Venofer 50mg  q week No ESA Hectorol 73mcg IV q HD Korsuva 59ml IV q HD Velphoro 1 tab PO with meals  Gean Quint, MD [facility]   Subjective:   S/p left ring finger amputation yesterday, had left sided numbness post op. Code stroke called, see above.  Patient reports feeling better this AM. Open for HD today   Objective:   BP (!) 97/59 (BP Location: Right Arm)    Pulse (!) 45    Temp 98.1 F (36.7 C) (Oral)    Resp 18    Ht 6\' 1"  (1.854 m)    Wt 104.3 kg    SpO2 96%    BMI 30.34 kg/m   Intake/Output Summary (Last 24 hours) at 10/06/2021 4401 Last data filed at 10/06/2021 0030 Gross per 24 hour  Intake 50 ml  Output 2005 ml  Net -1955 ml   Weight change:   Physical Exam: Gen:nad CVS: bradycardic Resp:cta bl UUV:OZDG UYQ:IHKV hand dressing in place, no sig edema Dialysis access: LUE AVF +b/t  Imaging: MR BRAIN WO  CONTRAST  Result Date: 10/06/2021 CLINICAL DATA:  51 year old male dialysis patient. Neurologic deficit. EXAM: MRI HEAD WITHOUT CONTRAST TECHNIQUE: Multiplanar, multiecho pulse sequences of the brain and surrounding structures were obtained without intravenous contrast. COMPARISON:  Brain MRI and head CT 09/26/2020. FINDINGS: Brain: Punctate area of abnormal diffusion anterior right middle frontal gyrus subcortical white matter (series 5 image 80), thought related to T2 shine through on the prior exam. Questionably restricted now. T2 and FLAIR hyperintensity there unchanged (series 10, image 16 and series 11, image 16). No other restricted diffusion. No midline shift, mass effect, evidence of mass lesion, ventriculomegaly, extra-axial collection or acute intracranial hemorrhage. Cervicomedullary junction and pituitary are within normal limits. Stable scattered T2 and FLAIR hyperintensity in the bilateral white matter, most pronounced left corona radiata associated with chronic lacunar infarct of the left external capsule. No cortical encephalomalacia or chronic cerebral blood products identified. Vascular: Major intracranial vascular flow voids are stable. Skull and upper cervical spine: Negative. Sinuses/Orbits: Stable, negative. Other: Mastoids remain clear. Grossly negative visible internal auditory structures. IMPRESSION: 1. Questionable punctate subacute white matter lacunar infarct of the anterior right middle frontal gyrus, not significantly changed since 09/26/2020 and may instead be T2 shine through artifact. 2. No other acute intracranial abnormality. Stable other chronic small vessel disease, most pronounced at the left corona radiata/external capsule. Electronically Signed   By: Herminio Heads.D.  On: 10/06/2021 04:59   DG Hand Complete Left  Result Date: 10/04/2021 CLINICAL DATA:  Right finger pain and swelling. EXAM: LEFT HAND - COMPLETE 3+ VIEW COMPARISON:  None. FINDINGS: There is no evidence of  fracture or dislocation. There is no evidence of arthropathy. Small defect is seen involving the distal tip of the fourth distal phalanx suggesting possible osteomyelitis. Vascular calcifications are noted. Large soft tissue wound or ulceration is seen involving distal tip of fourth finger. IMPRESSION: Large soft tissue wound or ulceration is seen involving distal tip of fourth finger. Small defect is seen involving the distal tuft of the fourth distal phalanx suggesting possible osteomyelitis. Electronically Signed   By: Marijo Conception M.D.   On: 10/04/2021 10:17    Labs: BMET Recent Labs  Lab 10/04/21 0726 10/05/21 0500 10/05/21 1650 10/05/21 2356  NA 139 136 131* 134*  K 5.2* 7.4* 3.5 4.8  CL 93* 89* 83* 91*  CO2 24 19*  --  20*  GLUCOSE 194* 113* 300* 169*  BUN 64* 78* 29* 44*  CREATININE 9.41* 10.99* 5.80* 7.62*  CALCIUM 9.1 9.2  --  9.2   CBC Recent Labs  Lab 10/04/21 0726 10/05/21 0500 10/05/21 1650 10/05/21 2356  WBC 8.5 9.7  --  9.0  NEUTROABS 6.1  --   --  6.8  HGB 10.2* 11.0* 11.6* 10.8*  HCT 31.9* 35.3* 34.0* 33.6*  MCV 97.0 98.9  --  96.3  PLT 209 220  --  218    Medications:     aspirin EC  81 mg Oral Daily   atorvastatin  80 mg Oral Daily   Chlorhexidine Gluconate Cloth  6 each Topical Q0600   Chlorhexidine Gluconate Cloth  6 each Topical Q0600   clopidogrel  75 mg Oral Daily   gabapentin  100 mg Oral TID   heparin  5,000 Units Subcutaneous Q8H   insulin aspart  0-5 Units Subcutaneous QHS   insulin aspart  0-9 Units Subcutaneous TID WC   linaclotide  72 mcg Oral QAC breakfast   pantoprazole  40 mg Oral BID   sodium chloride flush  3 mL Intravenous Q12H   sucroferric oxyhydroxide  500 mg Oral TID WC      Gean Quint, MD [facility] 10/06/2021, 8:19 AM

## 2021-10-06 NOTE — Progress Notes (Signed)
PT Cancellation Note  Patient Details Name: Edward [name] MRN: [id number] DOB: 27-Mar-1971   Cancelled Treatment:    Reason Eval/Treat Not Completed: PT screened, no needs identified, will sign off. Patient reporting he is at his baseline, denies any numbness in L side at this time, only reports itching sensation. Pt able to ambulate to bathroom with OT assistance and denies any mobility concerns at this time. Pt declines PT evaluation and reports he is eager to discharge home. PT will sign off at this time. Please re-consult if mobility concerns arise.   Zenaida Niece 10/06/2021, 12:46 PM

## 2021-10-06 NOTE — Evaluation (Signed)
Occupational Therapy Evaluation Patient Details Name: Edward [name] MRN: [id number] DOB: February 12, 1971 Today's Date: 10/06/2021   History of Present Illness 51 y.o. male with medical history significant for ESRD on TTS HD, CAD s/p PCI with DES (left circumflex 07/2018), chronic combined systolic and diastolic CHF (EF 73-41% November 2022), severe mitral and tricuspid valve regurgitation, PVD s/p right BKA, insulin-dependent diabetes, hyperlipidemia, Hx Mobitz 1 heart block, orthostatic hypotension with dialysis who presented to the ED for evaluation of worsening left fourth finger infection/wound.  Amputation performed 2/17.   Clinical Impression   Patient admitted for the procedure above.  PTA he lives with his son, who is able to assist as needed.  He continues to drive, walks with a SPC, and only needs assist with donning his L shoe.  He is very close to his baseline for mobility, RW used, and he was not tried with a SPC, but he will need a little assist with donning his prosthetic from family post surgery.  No significant acute OT needs, and no post acute OT recommended.  Follow up with surgeon as prescribed.        Recommendations for follow up therapy are one component of a multi-disciplinary discharge planning process, led by the attending physician.  Recommendations may be updated based on patient status, additional functional criteria and insurance authorization.   Follow Up Recommendations  No OT follow up    Assistance Recommended at Discharge Set up Supervision/Assistance  Patient can return home with the following      Functional Status Assessment  Patient has not had a recent decline in their functional status  Equipment Recommendations  None recommended by OT    Recommendations for Other Services       Precautions / Restrictions Precautions Precautions: Fall Restrictions Weight Bearing Restrictions: Yes LUE Weight Bearing: Weight bearing as tolerated      Mobility  Bed Mobility Overal bed mobility: Modified Independent                  Transfers Overall transfer level: Modified independent Equipment used: Rolling walker (2 wheels)               General transfer comment: Did not trial patient with SPC      Balance Overall balance assessment: Mild deficits observed, not formally tested                                         ADL either performed or assessed with clinical judgement   ADL Overall ADL's : At baseline                                       General ADL Comments: Assist with L shoe, and assist to don prosthetic due to L hand surgery     Vision Patient Visual Report: No change from baseline       Perception     Praxis      Pertinent Vitals/Pain Pain Assessment Pain Assessment: Faces Faces Pain Scale: Hurts little more Pain Location: L hand Pain Descriptors / Indicators: Tender Pain Intervention(s): Monitored during session, Premedicated before session     Hand Dominance Right   Extremity/Trunk Assessment Upper Extremity Assessment Upper Extremity Assessment: LUE deficits/detail LUE: Unable to fully assess due to immobilization LUE Sensation: WNL LUE Coordination:  decreased fine motor   Lower Extremity Assessment Lower Extremity Assessment: Overall WFL for tasks assessed   Cervical / Trunk Assessment Cervical / Trunk Assessment: Kyphotic   Communication Communication Communication: No difficulties   Cognition Arousal/Alertness: Awake/alert Behavior During Therapy: WFL for tasks assessed/performed Overall Cognitive Status: Within Functional Limits for tasks assessed                                       General Comments       Exercises     Shoulder Instructions      Home Living Family/patient expects to be discharged to:: Private residence Living Arrangements: Children Available Help at Discharge: Family;Available 24 hours/day Type of  Home: House Home Access: Stairs to enter [employer] of Steps: 2-3   Home Layout: One level     Bathroom Shower/Tub: Occupational psychologist: Standard Bathroom Accessibility: Yes How Accessible: Accessible via walker Home Equipment: [place] - single point;Shower seat;BSC/3in1;Wheelchair - manual          Prior Functioning/Environment Prior Level of Function : Needs assist       Physical Assist : ADLs (physical)   ADLs (physical): Dressing Mobility Comments: Walks with a SPC at baseline. ADLs Comments: Assist as needed with L shoe.        OT Problem List: Pain      OT Treatment/Interventions:      OT Goals(Current goals can be found in the care plan section) Acute Rehab OT Goals Patient Stated Goal: Hopin got reutn home today OT Goal Formulation: With patient Time For Goal Achievement: 10/19/21 Potential to Achieve Goals: Good  OT Frequency:      Co-evaluation              AM-PAC OT "6 Clicks" Daily Activity     Outcome Measure Help from another person eating meals?: None Help from another person taking care of personal grooming?: None Help from another person toileting, which includes using toliet, bedpan, or urinal?: None Help from another person bathing (including washing, rinsing, drying)?: A Little Help from another person to put on and taking off regular upper body clothing?: None Help from another person to put on and taking off regular lower body clothing?: A Little 6 Click Score: 22   End of Session Equipment Utilized During Treatment: Rolling walker (2 wheels) Nurse Communication: Mobility status  Activity Tolerance: Patient tolerated treatment well Patient left: in bed;with call bell/phone within reach  OT Visit Diagnosis: Pain Pain - Right/Left: Left Pain - part of body: Hand                Time: 1231-1250 OT Time Calculation (min): 19 min Charges:  OT General Charges $OT Visit: 1 Visit OT Evaluation $OT Eval  Moderate Complexity: 1 Mod  10/06/2021  RP, OTR/L  Acute Rehabilitation Services  Office:  [phone]   Metta Clines 10/06/2021, 1:22 PM

## 2021-10-06 NOTE — Progress Notes (Signed)
Pt transported to HD via bed by transporter. 

## 2021-10-06 NOTE — Progress Notes (Signed)
Spoke with Dr.Kuzma regarding pain medicine.Dilaudid iv dc'd due to pt c/o itchiness.Morpine prn ordered as needed per MD.Hold plavix and heparin sq tonight per Dr.Kuzma  2230-Paged Hospitalist. Pt HR is 40's and sbp 80's.Pt is lethargic.Will do stat EKG  2245- Hospitalist at bedside.

## 2021-10-07 ENCOUNTER — Telehealth: Payer: Self-pay | Admitting: Physician Assistant

## 2021-10-07 NOTE — Telephone Encounter (Signed)
Transition of care contact from inpatient facility  Date of Discharge: 10/06/21 Date of Contact: 10/07/21 Method of contact: Phone  Attempted to contact patient to discuss transition of care from inpatient admission. Patient did not answer the phone. Message was left on the patient's voicemail with call back instructions.  Anice Paganini, PA-C 10/07/2021, 12:11 PM  [facility] Pager: [phone]

## 2021-10-08 ENCOUNTER — Telehealth: Payer: Self-pay | Admitting: *Deleted

## 2021-10-08 LAB — HEPATITIS B SURFACE ANTIBODY, QUANTITATIVE: Hep B S AB Quant (Post): 13.5 m[IU]/mL (ref >9.9)

## 2021-10-08 NOTE — Telephone Encounter (Signed)
This email is to notify you that the patient listed below had a leads off issue  in the first 24 hours of wear.  We have placed an order for a replacement device to be overnighted to the patient. We have placed a hold on the billing, so the patient is not charged for the first device.  If you have any questions, please respond to this email, or call us at [phone].   Patient initials: J.B.   Patient ID: [id number]  SN: [id number]  Ticket: [id number]   Account: [facility]   Thank you,   [place]

## 2021-10-09 LAB — AEROBIC/ANAEROBIC CULTURE W GRAM STAIN (SURGICAL/DEEP WOUND)

## 2021-10-10 ENCOUNTER — Ambulatory Visit: Payer: Medicare Other | Admitting: Vascular Surgery

## 2021-10-10 ENCOUNTER — Inpatient Hospital Stay (HOSPITAL_COMMUNITY): Admission: RE | Admit: 2021-10-10 | Payer: Medicare Other | Source: Ambulatory Visit

## 2021-10-10 ENCOUNTER — Encounter (HOSPITAL_COMMUNITY): Payer: Self-pay | Admitting: Cardiology

## 2021-10-10 ENCOUNTER — Ambulatory Visit (HOSPITAL_COMMUNITY): Payer: Medicare Other | Attending: Cardiology

## 2021-10-10 LAB — SURGICAL PATHOLOGY

## 2021-10-11 ENCOUNTER — Ambulatory Visit (HOSPITAL_COMMUNITY): Admission: RE | Admit: 2021-10-11 | Payer: Medicare Other | Source: Home / Self Care | Admitting: Orthopedic Surgery

## 2021-10-11 ENCOUNTER — Encounter (HOSPITAL_COMMUNITY): Admission: RE | Payer: Self-pay | Source: Home / Self Care

## 2021-10-11 LAB — AEROBIC/ANAEROBIC CULTURE W GRAM STAIN (SURGICAL/DEEP WOUND)

## 2021-10-11 SURGERY — AMPUTATION DIGIT
Anesthesia: Choice | Laterality: Left

## 2021-10-23 ENCOUNTER — Telehealth: Payer: Self-pay

## 2021-10-23 NOTE — Telephone Encounter (Signed)
Patient is scheduled at [facility] with Dr. Havery Moros on 4-6 at 8:30 am. Called and left message for patient to call back to confirm date and time. I will send him instructions via MyChart and mail. ?

## 2021-10-26 NOTE — Telephone Encounter (Signed)
Called and spoke to patient. He confirmed he can come for EGD at [facility] on Thursday, 4-6.  He understands this is a Thursday and he will need to rearrange his dialysis.  Instructions to be sent to patient at [place] and via mail. Patient will remain on Plavix for procedure per Provider ?

## 2021-10-31 NOTE — Patient Outreach (Signed)
[facility]) Care Management ? ?10/31/2021 ? ?[name] ?11-21-1970 ?[id number] ? ? ?Received referral for Care Management from Insurance plan. Assigned patient to Deloria Lair, RN Care Coordinator for follow. ? ?Philmore Pali ?[place] Management Assistant ?[phone] ? ?

## 2021-11-08 ENCOUNTER — Other Ambulatory Visit: Payer: Self-pay | Admitting: *Deleted

## 2021-11-08 NOTE — Patient Outreach (Signed)
[facility]) Care Management ? ?11/08/2021 ? ?[name] ?1971/01/22 ?[id number] ? ?Telphone outreach for [employer] referral for [facility] servcies. ? ?Talked with Mr. Glomski and provided information on services. He states he is doing well with the support he has from his providers and family members. He has accepted the offer of receiving a letter with our information for future reference. ? ?Eulah Pont. Janaya Broy, MSN, GNP-BC ?Gerontological Nurse Practitioner ?[facility] Care Management ?[phone] ? ? ?

## 2021-11-12 ENCOUNTER — Telehealth: Payer: Self-pay | Admitting: Orthopedic Surgery

## 2021-11-12 ENCOUNTER — Other Ambulatory Visit: Payer: Self-pay | Admitting: Orthopedic Surgery

## 2021-11-12 NOTE — Telephone Encounter (Signed)
Pt called and states that he needs a script for a shrinker.  ? ?CB [phone] ?

## 2021-11-12 NOTE — Telephone Encounter (Signed)
Pt is a right BKA 2019 his last office visit was 10/2019. Insurance will require that he have an office visit in order to honor the rx. Can you please call to schedule?  ?

## 2021-11-13 ENCOUNTER — Telehealth: Payer: Self-pay | Admitting: Cardiology

## 2021-11-13 ENCOUNTER — Telehealth: Payer: Self-pay | Admitting: Orthopedic Surgery

## 2021-11-13 DIAGNOSIS — R072 Precordial pain: Secondary | ICD-10-CM

## 2021-11-13 NOTE — Telephone Encounter (Signed)
? ?  Patient Name: Edward [name]  ?DOB: [birth year] ?MRN: [id number] ? ?Primary Cardiologist: Kirk Ruths, MD ? ?Chart reviewed as part of pre-operative protocol coverage.  ? ?Patient has complex hx including CAD s/p prior NSTEMI 2019 s/p DES to LCx with residual disease, renal failure on HD, chronic combined CHF with management complicated by hypotension, PAD s/p prior LE intervention, Mobitz 1 second degree AVB, HTN, anxiety, anemia, severe mitral and tricuspid regurgitation. At recent [place] 09/21/21 he was complaining of some intermittent chest pain so Lexiscan stress test and 3 day monitor were ordered. These have not yet been completed. We received clearance request today for left index finger amputation and left hand wound closure for 3/30 and request to hold ASA 2 days prior. Given recent symptoms and non-completed testing, will route to Dr. Stanford Breed for review.  ? ?Charlie Pitter, PA-C ?11/13/2021, 9:33 AM ? ? ?

## 2021-11-13 NOTE — Telephone Encounter (Signed)
? ?  Patient Name: Edward [name]  ?DOB: 24-Dec-1970 ?MRN: [id number] ? ?Primary Cardiologist: Kirk Ruths, MD ? ?Chart revisited upon MD reply. As below, patient recently seen in office 09/2021 with chest pain and was recommended to have stress test and monitor. Will route to callback team to call Dr. Tawni Levy office to relay this recommendation. Do not see stress test has been scheduled and monitor is not yet read from the date so will need assistance with completion of these studies. If Dr. Fredna Dow feels procedure cannot be delayed, please reply back and cc Dr. Stanford Breed for review. ? ?Patient does have follow-up scheduled 11/19/21 which I would recommend to keep at this time. ? ?Charlie Pitter, PA-C ?11/13/2021, 10:41 AM ? ? ?

## 2021-11-13 NOTE — Telephone Encounter (Addendum)
I will call surgeon office and relay recommendations. I will also forward notes to Dr. Jacalyn Lefevre nurse in regard to stress test and monitor. I will forward these notes to Coletta Memos, FNP for upcoming appt. I will send FYI to requesting office as well.  ? ?Edward [name], from Dr. Fredna Dow office called back. She states that Dr. Fredna Dow feels this surgery should not be delayed as the pt is at risk for an infection. Dr. Fredna Dow prefers to still proceed with the surgery planned for 11/15/21 if at all possible. I did inform Edward [name] that the pt is to be set up for a stress test as well. I did inform her that Dr. Jacalyn Lefevre nurse did s/w the pt as well today and informed about the stress test. Edward [name] asked me when the stress test can be done. I told her it depends on the schedule for the stress test.  ? ?Edward [name] did ask if she can get Hilda Blades to call her back with when the stress test can be done. Please call Edward [name] at [phone]. Edward [name] did state she is not taking the pt off the surgery schedule until they hear back from our office. She wanted to let Dr. Stanford Breed know as well that they did surgery a few weeks ago with the. I stated that we never had a clearance on the. Edward [name] stated she thinks someone saw the pt in the hospital.  ? ?I assured Edward [name] I will send this to all parties involved to help expedite this.  ?

## 2021-11-13 NOTE — Telephone Encounter (Signed)
Left detailed message for brenda of dr Jacalyn Lefevre recommendations. She is to call back with questions. ?

## 2021-11-13 NOTE — Telephone Encounter (Signed)
LMOM for pt to return call to sch an ROV for rx for shrinker per AF note ?

## 2021-11-13 NOTE — Telephone Encounter (Signed)
Spoke with pt, aware he has an appointment 11/19/21 to be seen. Patient also aware he will need to have stress test done. I asked the patient about his monitor and he reports it did not work and his son mailed it back but he is not sure. He will find out from his som about the monitor and let us know.Order placed for nuclear testing. ?

## 2021-11-13 NOTE — Telephone Encounter (Signed)
LMOM for pt to return call to sch appt, will try again this afternoon ?

## 2021-11-13 NOTE — Progress Notes (Deleted)
? ?Cardiology Clinic Note  ? ?Patient Name: Edward [name] ?Date of Encounter: 11/13/2021 ? ?Primary Care Provider:  [name], MD ?Primary Cardiologist:  Kirk Ruths, MD ? ?Patient Profile  ?  ?Edward [name] 51 year old male presents the clinic today for follow-up evaluation of his coronary artery disease and first-degree AV block ? ?Past Medical History  ?  ?Past Medical History:  ?Diagnosis Date  ? Allergy   ? Anemia   ? getting Venofer with dialysis  ? Anxiety   ? self reported, sometimes-situational anxiety  ? Arthritis   ? bilateral hands/LEFT hip  ? CHF (congestive heart failure) ([place])   ? Chronic kidney disease   ? t th s dialysis- [place] ---ESRD  ? Complication of anesthesia   ? slow to go to sleep  ? Coronary artery disease   ? Depression   ? self reported, sometimes-situational depression  ? Diabetes mellitus   ? type II- on meds  ? Dyspnea   ? sometimes- fluid  ? GERD (gastroesophageal reflux disease)   ? on meds  ? Hypertension   ? no longer on medication since starting on Hemodialysis  ? Myocardial infarction [facility]) 2019  ? Sleep apnea   ? ?Past Surgical History:  ?Procedure Laterality Date  ? A/V FISTULAGRAM Left 03/06/2020  ? Procedure: A/V FISTULAGRAM;  Surgeon: Waynetta Sandy, MD;  Location: [place] [facility];  Service: Cardiovascular;  Laterality: Left;  ? ABDOMINAL AORTOGRAM W/LOWER EXTREMITY N/A 04/09/2021  ? Procedure: ABDOMINAL AORTOGRAM W/LOWER EXTREMITY;  Surgeon: Waynetta Sandy, MD;  Location: [place] [facility];  Service: Cardiovascular;  Laterality: N/A;  ? AMPUTATION Left 05/10/2016  ? Procedure: Left Transmetatarsal Amputation;  Surgeon: [name], MD;  Location: [place];  Service: Orthopedics;  Laterality: Left;  ? AMPUTATION Right 05/16/2018  ? Procedure: PARTIAL RAY AMPUTATION FIFTH TOE RIGHT FOOT;  Surgeon: [name], DPM;  Location: [place];  Service: Podiatry;  Laterality: Right;  ? AMPUTATION Right 06/17/2018  ? Procedure: AMPUTATION 4th RAY Right  Foot;  Surgeon: Trula Slade, DPM;  Location: [place];  Service: Podiatry;  Laterality: Right;  ? AMPUTATION Right 06/19/2018  ? Procedure: RIGHT BELOW KNEE AMPUTATION;  Surgeon: [name], MD;  Location: [place];  Service: Orthopedics;  Laterality: Right;  ? AMPUTATION Left 10/05/2021  ? Procedure: LEFT RING FINGER AMPUTATION;  Surgeon: Daryll Brod, MD;  Location: [place];  Service: Orthopedics;  Laterality: Left;  45 MIN  ? APPLICATION OF WOUND VAC Right 06/19/2018  ? Procedure: APPLICATION OF WOUND VAC;  Surgeon: [name], MD;  Location: [place];  Service: Orthopedics;  Laterality: Right;  ? AV FISTULA PLACEMENT Left 11/25/2019  ? Procedure: LEFT ARM Brachiocephalic  ARTERIOVENOUS (AV) FISTULA CREATION;  Surgeon: Rosetta Posner, MD;  Location: [place];  Service: Vascular;  Laterality: Left;  ? BASCILIC VEIN TRANSPOSITION Left 07/19/2020  ? Procedure: LEFT SECOND STAGE BASCILIC VEIN TRANSPOSITION;  Surgeon: Waynetta Sandy, MD;  Location: [place];  Service: Vascular;  Laterality: Left;  ? CARDIAC CATHETERIZATION  07/29/2018  ? CIRCUMCISION  09/02/2011  ? Procedure: CIRCUMCISION ADULT;  Surgeon: Molli Hazard, MD;  Location: [place];  Service: Urology;  Laterality: N/A;  ? CORONARY STENT INTERVENTION N/A 07/29/2018  ? Procedure: CORONARY STENT INTERVENTION;  Surgeon: Leonie Man, MD;  Location: [place] [facility];  Service: Cardiovascular;  Laterality: N/A;  ? I & D EXTREMITY  09/02/2011  ? Procedure: IRRIGATION AND  DEBRIDEMENT EXTREMITY;  Surgeon: Theodoro Kos, DO;  Location: [place];  Service: Plastics;  Laterality: N/A;  ? INCISION AND DRAINAGE OF WOUND  08/12/2011  ? Procedure: IRRIGATION AND DEBRIDEMENT WOUND;  Surgeon: Zenovia Jarred, MD;  Location: [place];  Service: General;;  perineum  ? INSERTION OF DIALYSIS CATHETER N/A 11/25/2019  ? Procedure: INSERTION OF Righ Internal Jugular DIALYSIS CATHETER;  Surgeon: Rosetta Posner, MD;  Location: [place];   Service: Vascular;  Laterality: N/A;  ? [place]  08/12/2011  ? Procedure: IRRIGATION AND DEBRIDEMENT ABSCESS;  Surgeon: Molli Hazard, MD;  Location: [place];  Service: Urology;  Laterality: N/A;  irrigation and debridment perineum    ? IRRIGATION AND DEBRIDEMENT ABSCESS  08/14/2011  ? Procedure: MINOR INCISION AND DRAINAGE OF ABSCESS;  Surgeon: Molli Hazard, MD;  Location: [place];  Service: Urology;  Laterality: N/A;  Perineal Wound Debridement;Placement Bilateral Testicular Thigh Pouches  ? LEFT HEART CATH AND CORONARY ANGIOGRAPHY N/A 07/29/2018  ? Procedure: LEFT HEART CATH AND CORONARY ANGIOGRAPHY;  Surgeon: Leonie Man, MD;  Location: [place] [facility];  Service: Cardiovascular;  Laterality: N/A;  ? PERIPHERAL VASCULAR ATHERECTOMY Left 04/09/2021  ? Procedure: PERIPHERAL VASCULAR ATHERECTOMY;  Surgeon: Waynetta Sandy, MD;  Location: [place] [facility];  Service: Cardiovascular;  Laterality: Left;  TP trunk and peroneal arteries  ? PERIPHERAL VASCULAR BALLOON ANGIOPLASTY Left 04/09/2021  ? Procedure: PERIPHERAL VASCULAR BALLOON ANGIOPLASTY;  Surgeon: Waynetta Sandy, MD;  Location: [place] [facility];  Service: Cardiovascular;  Laterality: Left;  Popliteal  ? REVISON OF ARTERIOVENOUS FISTULA Left 05/24/2020  ? Procedure: LEFT ARM ARTERIOVENOUS FISTULA  CONVERSION TO BASILIC VEIN FISTULA;  Surgeon: Waynetta Sandy, MD;  Location: [place];  Service: Vascular;  Laterality: Left;  ? TOTAL HIP ARTHROPLASTY Left 10/11/2016  ? Procedure: LEFT TOTAL HIP ARTHROPLASTY ANTERIOR APPROACH;  Surgeon: Mcarthur Rossetti, MD;  Location: [facility];  Service: Orthopedics;  Laterality: Left;  ? ? ?Allergies ? ?Allergies  ?Allergen Reactions  ? Crestor [Rosuvastatin] Rash  ?  Also had a red eye  ? Benadryl [Diphenhydramine] Rash  ? ? ?History of Present Illness  ?  ?Edward [name] has a PMH of coronary artery disease, hyperlipidemia, chest pain, chronic  combined systolic and diastolic CHF, cardiomyopathy, severe mitral valve and tricuspid valve regurgitation, PVD, and Mobitz second-degree AV block.  He was admitted 12/19 with CHF and NSTEMI.  His cardiac catheterization at that time showed 85% circumflex, 90% mid-distal LAD (not amenable to PCI) and 75% first marginal.  He had PCI to his left circumflex with DES x1.  He was admitted 4/21 with syncope in the setting of nausea and acute renal failure.  His potassium was 6.9.  His echocardiogram 4/21 showed an EF of 40-45%, mild left ventricular hypertrophy, apical hypokinesis, moderate left atrial enlargement, mild mitral regurgitation, mild aortic insufficiency.  A stress echo 2/22 was normal.  Carotid Dopplers 2/22 were nearly normal bilaterally.  He underwent arthrectomy of the left popliteal and left tibia peroneal and left peroneal arteries as well as drug coated balloon angioplasty of left popliteal artery and balloon angioplasty of the left tibioperoneal trunk and left peroneal artery 8/22.  [facility] at [facility] 11/22 showed an ejection fraction of 40-45%, mild RV dysfunction, mild aortic insufficiency, severe mitral regurgitation, severe tricuspid regurgitation.  He was seen in the emergency department January with altered mental status.  His initial blood glucose was 30.  His mental status improved in the emergency department.  He was noted to have Mobitz 1 second-degree AV block. ? ? He was seen in follow-up by Dr. Stanford Breed on 09/21/2021.  During that time he described chest pain that he felt was indigestion.  He reported the discomfort in his left chest area and denied radiation.  He noticed the discomfort mainly after eating.  The discomfort would last for 45 minutes and improved with vomiting.  He denied exertional symptoms.  A stress test and cardiac event monitor were ordered.  His cardiac event monitor was reportedly broken.  It was not completed.  And his cardiac stress test is scheduled  for 11/22/2021. ? ?He presents to the clinic today for follow-up evaluation and states*** ? ?*** denies chest pain, shortness of breath, lower extremity edema, fatigue, palpitations, melena, hematuria, hemoptysis, diaph

## 2021-11-13 NOTE — Telephone Encounter (Signed)
? ?  [facility] Pre-operative Risk Assessment  ?  ?Request for surgical clearance: ? ?What type of surgery is being performed?  ? Left Index Finger Amputation, Left Hand Wound Closure ? ?When is this surgery scheduled?  ?11/15/21  ? ?What type of clearance is required (medical clearance vs. Pharmacy clearance to hold med vs. Both)?  ?Both  ? ?Are there any medications that need to be held prior to surgery and how long? ?Aspirin, 2 days prior to procedure   ? ?Practice name and name of physician performing surgery?  ?[facility]  Dr. Leanora Cover  ? ?What is your office phone number? ?[phone]  ?  ?7.   What is your office fax number? ?[phone] ? ?8.   Anesthesia type (None, local, MAC, general) ?  ?Choice  ? ? ?Zara Council ?11/13/2021, 9:08 AM  ?

## 2021-11-14 ENCOUNTER — Encounter (HOSPITAL_COMMUNITY): Payer: Self-pay | Admitting: Orthopedic Surgery

## 2021-11-14 ENCOUNTER — Other Ambulatory Visit: Payer: Self-pay

## 2021-11-14 ENCOUNTER — Encounter (HOSPITAL_COMMUNITY): Payer: Self-pay | Admitting: Physician Assistant

## 2021-11-14 ENCOUNTER — Encounter (HOSPITAL_COMMUNITY): Payer: Self-pay | Admitting: Gastroenterology

## 2021-11-14 NOTE — Telephone Encounter (Signed)
Patient is pending stress test and monitor with next follow up on 11/19/2021, however per previous discussion, it sounds like the surgery was felt to be urgent due to possibility of infection.  ? ?Per Dr. Stanford Breed: "If surgery is urgent then can proceed with the understanding that he has not had a full evaluation of his cardiac status." ? ? ?

## 2021-11-14 NOTE — Progress Notes (Signed)
Attempted to obtain medical history via telephone, unable to reach at this time. I left a voicemail to return pre surgical testing department's phone call.  

## 2021-11-14 NOTE — Progress Notes (Addendum)
Anesthesia Chart Review: ?Same day workup ? ?Folllows with cardiology for hx of CAD, Severe MR and TR, and combined HF. Patient admitted December 2019 with CHF and non-ST elevation myocardial infarction.  Cardiac catheterization revealed an 85% circumflex, 90% mid to distal LAD (distal LAD small vessel and not amenable to PCI) and 75% first marginal.  Patient had PCI of his left circumflex with drug-eluting stent. Patient admitted April 2021 with syncope in the setting of nausea and acute renal failure.  Potassium was 6.9. Echo showed ejection fraction 40 to 45%, mild left ventricular hypertrophy, apical hypokinesis, moderate left atrial enlargement, mild mitral regurgitation, mild aortic insufficiency. Stress echo February 2022 normal. Carotid Dopplers February 2022 near normal bilaterally.  Patient had atherectomy of left popliteal, left tibioperoneal and left peroneal arteries as well as drug coated balloon angioplasty of left popliteal artery and plain balloon angioplasty of left tibioperoneal trunk and left peroneal artery August 2022.  Echocardiogram at [facility] November 2022 showed ejection fraction 40 to 45%, mild RV dysfunction, mild aortic insufficiency, severe mitral regurgitation, severe tricuspid regurgitation.  Patient seen in the emergency room January 19 with altered mental status.  Initial CBG was 30.  Mental status improved in the emergency room.  He was noted to have Mobitz 1 second-degree AV block.  Last seen by Dr. Stanford Breed 09/21/21 at which time he reported some chest discomfort he attributed to indigestion (GI eval pending). Dr. Stanford Breed ordered nuclear stress to screen for ischemia. Before this could be done, pt was admitted for left fourth finger infection/wound and underwent amputation on 10/05/21. Pt now has similar wound on Left index finger requiring amputation. Pt has still not had stress test. Cardiology input was sought. Per telephone encounter 11/14/21 by Almyra Deforest, PA-C, "Patient  is pending stress test and monitor with next follow up on 11/19/2021, however per previous discussion, it sounds like the surgery was felt to be urgent due to possibility of infection. Per Dr. Stanford Breed: "If surgery is urgent then can proceed with the understanding that he has not had a full evaluation of his cardiac status."" ? ?ESRD on HD T/Th/Sa. ? ?IDDM2, last A1c 8.6 on 10/05/21. ? ?OSA, not on CPAP. ? ?Will need DOS labs and eval. ? ?EKG 10/05/21 (of note, junctional rhythm did resolve during admission): Junctional rhythm with retrograde conduction. Rate 48. Left anterior fascicular block. Cannot rule out Inferior infarct (masked by fascicular block?) , age undetermined. Anterolateral infarct , age undetermined ? ?TTE 07/06/21 (care everywhere): ?  MILD LV DYSFUNCTION (See above) WITH MODERATE LVH  ?  ELEVATED LA PRESSURES WITH DIASTOLIC DYSFUNCTION  ?  MILD RV SYSTOLIC DYSFUNCTION (See above)  ?  VALVULAR REGURGITATION: MILD AR, SEVERE MR, MILD PR, SEVERE TR  ?  NO VALVULAR STENOSIS  ?  TRIVIAL PERICARDIAL EFFUSION  ?  SEVERE TR WITH SYSTOLIC FLOW REVERSAL IN HEPATIC VEIN. ERO 0.4 cm2, RV 33  ?  mL.  ?  SEVERE MR WITH FLOW REVERSAL IN PULMONARY VEINS. ERO 0.1 cm2, RV 20 mL.  ? ?Stress echo 09/19/20: ? 1. This is a negative stress echocardiogram for ischemia.  ? 2. This is a low risk study.  ? ? ?Karoline Caldwell, PA-C ?[facility] Short Stay Center/Anesthesiology ?Phone [phone] ?11/14/2021 10:19 AM ? ?

## 2021-11-14 NOTE — Anesthesia Preprocedure Evaluation (Addendum)
Anesthesia Evaluation  ?Patient identified by MRN, date of birth, ID band ?Patient awake ? ? ? ?Reviewed: ?Allergy & Precautions, NPO status , Patient's Chart, lab work & pertinent test results ? ?History of Anesthesia Complications ?Negative for: history of anesthetic complications ? ?Airway ?Mallampati: III ? ?TM Distance: >3 FB ?Neck ROM: Full ? ? ? Dental ? ?(+) Loose, Dental Advisory Given,  ?  ?Pulmonary ?sleep apnea , former smoker,  ?  ?breath sounds clear to auscultation ? ? ? ? ? ? Cardiovascular ?hypertension, (-) angina+ CAD, + Past MI, + Cardiac Stents, + Peripheral Vascular Disease and +CHF  ? ?Rhythm:Regular  ?? Prox Cx to Mid Cx lesion is 85% stenosed. ?? A drug-eluting stent was successfully placed using a STENT SYNERGY DES [id number]. Postdilated and tapered fashion from 2.8 mm proximal and up to 3.1 mm in the distal two thirds of the stent. ?? Post intervention, there is a 0% residual stenosis. ?? LV end diastolic pressure is severely elevated. 25 mmHg ?? Mid LAD to Dist LAD lesion is 90% stenosed. Distal LAD small vessel. Not PCI target. ?? Lat 1st Mrg lesion is 75% stenosed. ?  ?SUMMARY ?? Severe two-vessel disease ?? Culprit lesion likely being the mid circumflex treated with a DES stent (Synergy DES 2.5 mm x 32 mm -postdilated to 3.1 mm),  ?? There is also diffuse extensive disease in the distal/apical portion of the LAD that is not PCI minimal.   ?? Several other very small branch vessels-diagonal branches, lateral OM's etc. have significant disease, but are too small for PTCA.   ?? Otherwise ectatic RCA and proximal to mid LAD. ?? Severely elevated LVEDP of 25 mmHg (in the setting of reduced EF) suggestive of ACUTE COMBINED SYSTOLIC AND DIASTOLIC HEART FAILURE ? ? ?1. Abnormal septal motion Distal septal / apical hypokinesis . Left  ?ventricular ejection fraction, by estimation, is 50 to 55%. The left  ?ventricle has low normal function. The left ventricle  demonstrates  ?regional wall motion abnormalities (see scoring  ?diagram/findings for description). The left ventricular internal cavity  ?size was mildly dilated. There is mild left ventricular hypertrophy. Left  ?ventricular diastolic parameters are consistent with Grade II diastolic  ?dysfunction (pseudonormalization).  ?Elevated left ventricular end-diastolic pressure.  ??2. Right ventricular systolic function is normal. The right ventricular  ?size is normal. There is moderately elevated pulmonary artery systolic  ?pressure.  ??3. Left atrial size was moderately dilated.  ??4. The pericardial effusion is posterior to the left ventricle.  ??5. Carpentier class 3B restricted posterior leaflet motion . The mitral  ?valve is abnormal. Moderate to severe mitral valve regurgitation. No  ?evidence of mitral stenosis.  ??6. The aortic valve is tricuspid. Aortic valve regurgitation is mild. No  ?aortic stenosis is present.  ??7. The inferior vena cava is normal in size with greater than 50%  ?respiratory variability, suggesting right atrial pressure of 3 mmHg.  ?  ?Neuro/Psych ?  ? GI/Hepatic ?Neg liver ROS, GERD  ,  ?Endo/Other  ?diabetes, Insulin DependentLab Results ?     Component                Value               Date                 ?     HGBA1C                   8.6 (H)  10/05/2021           ? ? Renal/GU ?ESRF and DialysisRenal diseaseLab Results ?     Component                Value               Date                 ?     CREATININE               4.70 (H)            11/15/2021           ?Lab Results ?     Component                Value               Date                 ?     K                        3.4 (L)             11/15/2021           ? ? ?Last HD today  ? ?  ?Musculoskeletal ? ?(+) Arthritis ,  ? Abdominal ?  ?Peds ? Hematology ? ?(+) Blood dyscrasia, anemia , Lab Results ?     Component                Value               Date                 ?     WBC                      8.4                  10/06/2021           ?     HGB                      10.2 (L)            11/15/2021           ?     HCT                      30.0 (L)            11/15/2021           ?     MCV                      96.5                10/06/2021           ?     PLT                      182                 10/06/2021           ?   ?Anesthesia Other Findings ? ? Reproductive/Obstetrics ? ?  ? ? ? ? ? ? ? ? ? ? ? ? ? ?  ?  ? ? ? ? ? ? ? ?  Anesthesia Physical ?Anesthesia Plan ? ?ASA: 4 ? ?Anesthesia Plan: General  ? ?Post-op Pain Management:   ? ?Induction: Intravenous ? ?PONV Risk Score and Plan: 2 and Ondansetron and Dexamethasone ? ?Airway Management Planned: LMA ? ?Additional Equipment: None ? ?Intra-op Plan:  ? ?Post-operative Plan: Extubation in OR ? ?Informed Consent: I have reviewed the patients History and Physical, chart, labs and discussed the procedure including the risks, benefits and alternatives for the proposed anesthesia with the patient or authorized representative who has indicated his/her understanding and acceptance.  ? ? ? ?Dental advisory given ? ?Plan Discussed with: CRNA and Anesthesiologist ? ?Anesthesia Plan Comments: (PAT note by Karoline Caldwell, PA-C: ?Folllows with cardiology for hx of CAD, Severe MR and TR, and combined HF. Patient admitted December 2019 with CHF and non-ST elevation myocardial infarction. ?Cardiac catheterization revealed an 85% circumflex, 90% mid to distal LAD (distal LAD small vessel and not amenable to PCI) and 75% first marginal. ?Patient had PCI of his left circumflex with drug-eluting stent.?Patient admitted April 2021 with syncope in the setting of nausea and acute renal failure. ?Potassium was 6.9. Echo showed ejection fraction 40 to 45%, mild left ventricular hypertrophy, apical hypokinesis, moderate left atrial enlargement, mild mitral regurgitation, mild aortic insufficiency. Stress echo February 2022 normal. Carotid Dopplers February 2022 near normal bilaterally. ?Patient had  atherectomy of left popliteal, left tibioperoneal and left peroneal arteries as well as drug coated balloon angioplasty of left popliteal artery and plain balloon angioplasty of left tibioperoneal trunk and left peroneal artery August 2022. ?Echocardiogram at [facility] November 2022 showed ejection fraction 40 to 45%, mild RV dysfunction, mild aortic insufficiency, severe mitral regurgitation, severe tricuspid regurgitation. ?Patient seen in the emergency room January 19 with altered mental status. ?Initial CBG was 30. ?Mental status improved in the emergency room. ?He was noted to have Mobitz 1 second-degree AV block. ?Last seen by Dr. Stanford Breed 09/21/21 at which time he reported some chest discomfort he attributed to indigestion (GI eval pending). Dr. Stanford Breed ordered nuclear stress to screen for ischemia. Before this could be done, pt was admitted for left fourth finger infection/wound and underwent amputation on 10/05/21. Pt now has similar wound on Left index finger requiring amputation. Pt has still not had stress test. Cardiology input was sought. Per telephone encounter 11/14/21 by Almyra Deforest, PA-C, "Patient is pending stress test and monitor with next follow up on 11/19/2021, however per previous discussion, it sounds like the surgery was felt to be urgent due to possibility of infection. Per Dr. Stanford Breed: "If surgery is urgent then can proceed with the understanding that he has not had a full evaluation of his cardiac status."" ? ?ESRD on HD T/Th/Sa. ? ?IDDM2, last A1c 8.6 on 10/05/21. ? ?OSA, not on CPAP. ? ?Will need DOS labs and eval. ? ?EKG 10/05/21 (of note, junctional rhythm did resolve during admission): Junctional rhythm with retrograde conduction. Rate 48. Left anterior fascicular block. Cannot rule out Inferior infarct (masked by fascicular block?) , age undetermined. Anterolateral infarct , age undetermined ? ?TTE 07/06/21 (care everywhere): ???MILD LV DYSFUNCTION (See above) WITH MODERATE LVH   ???ELEVATED LA PRESSURES WITH DIASTOLIC DYSFUNCTION  ???MILD RV SYSTOLIC DYSFUNCTION (See above)  ???VALVULAR REGURGITATION: MILD AR, SEVERE MR, MILD PR, SEVERE TR  ???NO VALVULAR STENOSIS  ???TRIVIAL PERICARDIAL EFF

## 2021-11-14 NOTE — Telephone Encounter (Signed)
I forwarded Dr. Jacalyn Lefevre recommendation to the surgeon's office.  ?

## 2021-11-14 NOTE — Progress Notes (Signed)
PCP - Dr. Berdine Addison ? ?Cardiologist - Dr. Stanford Breed ? ?EP- Denies ? ?Endocrine- Denies ? ?Pulm- Denies ? ?Chest x-ray - 09/06/21 (E) ? ?EKG - 10/05/21 (E) ? ?Stress Test - 09/19/20 (E) ? ?ECHO - 09/27/20 (E) ? ?Cardiac Cath - 07/29/18 (E) ? ?AICD- na ?PM- na ?LOOP- na ? ?Nerve Stimulator- Denies ? ?Dialysis- T-Th-S, pt states he had full session on 3/28 ? ?Sleep Study - Yes- Positive ?CPAP - Denies ? ?LABS- 11/15/21: I-Stat 8 ? ?ASA- LD- 3/29 ? ?ERAS- clears until 1200 ? ?HA1C- 10/05/21(E): 8.6 ?Fasting Blood Sugar - 110-300 ?Checks Blood Sugar __1___ time a week ? ?Anesthesia- Yes- cardiac history- notified ? ?Pt denies having chest pain, sob, or fever during the pre-op phone call. All instructions explained to the pt, with a verbal understanding of the material including: as of today, stop taking all Aspirin (unless instructed by your doctor) and Other Aspirin containing products, Vitamins, Fish oils, and Herbal medications. Also stop all NSAIDS i.e. Advil, Ibuprofen, Motrin, Aleve, Anaprox, Naproxen, BC, Goody Powders, and all Supplements.  ? ?WHAT DO I DO ABOUT MY DIABETES MEDICATION? ? ?THE NIGHT BEFORE SURGERY, take ____21_______ units of ____Novolog 70/30_______insulin.  ? ?How do I manage my blood sugar before surgery? ?Check your blood sugar the morning of your surgery when you wake up and every 2 hours until you get to the Short Stay unit. ?If your blood sugar is less than 70 mg/dL, you will need to treat for low blood sugar: ?Do not take insulin. ?Treat a low blood sugar (less than 70 mg/dL) with ? cup of clear juice (cranberry or apple), 4 glucose tablets, OR glucose gel. ?Recheck blood sugar in 15 minutes after treatment (to make sure it is greater than 70 mg/dL). If your blood sugar is not greater than 70 mg/dL on recheck, call [phone] ? for further instructions. ?Report your blood sugar to the short stay nurse when you get to Short Stay.  ?Reviewed and Endorsed by [facility] Patient Education Committee,  August 2015 ? ? Pt also instructed to wear a mask and social distance if he goes out. The opportunity to ask questions was provided.  ?

## 2021-11-14 NOTE — Telephone Encounter (Signed)
Patient is following up, regarding 3/30 procedure. He just wanted to inform pre-op that Dr. Levell July office plans to proceed with the procedure as scheduled. He requested a call back to discuss this. ?

## 2021-11-15 ENCOUNTER — Other Ambulatory Visit: Payer: Self-pay

## 2021-11-15 ENCOUNTER — Ambulatory Visit (HOSPITAL_BASED_OUTPATIENT_CLINIC_OR_DEPARTMENT_OTHER): Payer: Medicare Other | Admitting: Physician Assistant

## 2021-11-15 ENCOUNTER — Telehealth: Payer: Self-pay

## 2021-11-15 ENCOUNTER — Encounter (HOSPITAL_COMMUNITY)
Admission: RE | Disposition: A | Discharge status: Home / Self Care | Payer: Self-pay | Source: Home / Self Care | Attending: Orthopedic Surgery

## 2021-11-15 ENCOUNTER — Ambulatory Visit (HOSPITAL_COMMUNITY): Payer: Medicare Other | Admitting: Physician Assistant

## 2021-11-15 ENCOUNTER — Encounter (HOSPITAL_COMMUNITY): Payer: Self-pay | Admitting: Orthopedic Surgery

## 2021-11-15 ENCOUNTER — Ambulatory Visit (HOSPITAL_COMMUNITY)
Admission: RE | Admit: 2021-11-15 | Discharge: 2021-11-15 | Disposition: A | Discharge status: Home / Self Care | Payer: Medicare Other | Attending: Orthopedic Surgery | Admitting: Orthopedic Surgery

## 2021-11-15 DIAGNOSIS — K219 Gastro-esophageal reflux disease without esophagitis: Secondary | ICD-10-CM | POA: Insufficient documentation

## 2021-11-15 DIAGNOSIS — Z955 Presence of coronary angioplasty implant and graft: Secondary | ICD-10-CM | POA: Diagnosis not present

## 2021-11-15 DIAGNOSIS — T8789 Other complications of amputation stump: Secondary | ICD-10-CM | POA: Diagnosis not present

## 2021-11-15 DIAGNOSIS — E1152 Type 2 diabetes mellitus with diabetic peripheral angiopathy with gangrene: Secondary | ICD-10-CM | POA: Insufficient documentation

## 2021-11-15 DIAGNOSIS — I96 Gangrene, not elsewhere classified: Secondary | ICD-10-CM | POA: Insufficient documentation

## 2021-11-15 DIAGNOSIS — E11622 Type 2 diabetes mellitus with other skin ulcer: Secondary | ICD-10-CM | POA: Insufficient documentation

## 2021-11-15 DIAGNOSIS — I2510 Atherosclerotic heart disease of native coronary artery without angina pectoris: Secondary | ICD-10-CM | POA: Insufficient documentation

## 2021-11-15 DIAGNOSIS — Z87891 Personal history of nicotine dependence: Secondary | ICD-10-CM | POA: Diagnosis not present

## 2021-11-15 DIAGNOSIS — I509 Heart failure, unspecified: Secondary | ICD-10-CM | POA: Diagnosis not present

## 2021-11-15 DIAGNOSIS — I132 Hypertensive heart and chronic kidney disease with heart failure and with stage 5 chronic kidney disease, or end stage renal disease: Secondary | ICD-10-CM | POA: Insufficient documentation

## 2021-11-15 DIAGNOSIS — Z992 Dependence on renal dialysis: Secondary | ICD-10-CM | POA: Diagnosis not present

## 2021-11-15 DIAGNOSIS — E1122 Type 2 diabetes mellitus with diabetic chronic kidney disease: Secondary | ICD-10-CM | POA: Insufficient documentation

## 2021-11-15 DIAGNOSIS — E1151 Type 2 diabetes mellitus with diabetic peripheral angiopathy without gangrene: Secondary | ICD-10-CM

## 2021-11-15 DIAGNOSIS — L98494 Non-pressure chronic ulcer of skin of other sites with necrosis of bone: Secondary | ICD-10-CM | POA: Insufficient documentation

## 2021-11-15 DIAGNOSIS — G4730 Sleep apnea, unspecified: Secondary | ICD-10-CM | POA: Insufficient documentation

## 2021-11-15 DIAGNOSIS — Z794 Long term (current) use of insulin: Secondary | ICD-10-CM

## 2021-11-15 DIAGNOSIS — I252 Old myocardial infarction: Secondary | ICD-10-CM | POA: Diagnosis not present

## 2021-11-15 DIAGNOSIS — N186 End stage renal disease: Secondary | ICD-10-CM | POA: Insufficient documentation

## 2021-11-15 HISTORY — PX: AMPUTATION: SHX166

## 2021-11-15 HISTORY — PX: DEBRIDEMENT AND CLOSURE WOUND: SHX5614

## 2021-11-15 LAB — GLUCOSE, CAPILLARY
Glucose-Capillary: 83 mg/dL (ref 70–99)
Glucose-Capillary: 60 mg/dL — ABNORMAL LOW (ref 70–99)
Glucose-Capillary: 57 mg/dL — ABNORMAL LOW (ref 70–99)
Glucose-Capillary: 72 mg/dL (ref 70–99)

## 2021-11-15 LAB — POCT I-STAT, CHEM 8
BUN: 17 mg/dL (ref 6–20)
Calcium, Ion: 0.89 mmol/L — CL (ref 1.15–1.40)
Chloride: 95 mmol/L — ABNORMAL LOW (ref 98–111)
Creatinine, Ser: 4.7 mg/dL — ABNORMAL HIGH (ref 0.61–1.24)
Glucose, Bld: 77 mg/dL (ref 70–99)
HCT: 30 % — ABNORMAL LOW (ref 39.0–52.0)
Hemoglobin: 10.2 g/dL — ABNORMAL LOW (ref 13.0–17.0)
Potassium: 3.4 mmol/L — ABNORMAL LOW (ref 3.5–5.1)
Sodium: 134 mmol/L — ABNORMAL LOW (ref 135–145)
TCO2: 31 mmol/L (ref 22–32)

## 2021-11-15 SURGERY — AMPUTATION DIGIT
Anesthesia: General | Site: Hand | Laterality: Left

## 2021-11-15 MED ORDER — ONDANSETRON HCL 4 MG/2ML IJ SOLN
INTRAMUSCULAR | Status: AC
  Filled 2021-11-15: qty 2

## 2021-11-15 MED ORDER — ONDANSETRON HCL 4 MG/2ML IJ SOLN
INTRAMUSCULAR | Status: DC | PRN
  Administered 2021-11-15: 4 mg via INTRAVENOUS

## 2021-11-15 MED ORDER — DEXTROSE 50 % IV SOLN
INTRAVENOUS | Status: DC | PRN
  Administered 2021-11-15: 12.5 g via INTRAVENOUS

## 2021-11-15 MED ORDER — PHENYLEPHRINE 40 MCG/ML (10ML) SYRINGE FOR IV PUSH (FOR BLOOD PRESSURE SUPPORT)
PREFILLED_SYRINGE | INTRAVENOUS | Status: AC
  Filled 2021-11-15: qty 10

## 2021-11-15 MED ORDER — PROPOFOL 10 MG/ML IV BOLUS
INTRAVENOUS | Status: DC | PRN
  Administered 2021-11-15: 20 mg via INTRAVENOUS
  Administered 2021-11-15: 130 mg via INTRAVENOUS

## 2021-11-15 MED ORDER — ONDANSETRON HCL 4 MG/2ML IJ SOLN: 4.0000 mg | Freq: Once | INTRAMUSCULAR | Status: DC | PRN

## 2021-11-15 MED ORDER — DEXAMETHASONE SODIUM PHOSPHATE 10 MG/ML IJ SOLN
INTRAMUSCULAR | Status: AC
  Filled 2021-11-15: qty 1

## 2021-11-15 MED ORDER — OXYCODONE HCL 5 MG PO TABS: 5.0000 mg | ORAL_TABLET | Freq: Once | ORAL | Status: DC | PRN

## 2021-11-15 MED ORDER — 0.9 % SODIUM CHLORIDE (POUR BTL) OPTIME
TOPICAL | Status: DC | PRN
  Administered 2021-11-15: 1000 mL

## 2021-11-15 MED ORDER — LIDOCAINE 2% (20 MG/ML) 5 ML SYRINGE
INTRAMUSCULAR | Status: AC
  Filled 2021-11-15: qty 5

## 2021-11-15 MED ORDER — DEXTROSE 50 % IV SOLN
INTRAVENOUS | Status: AC
  Filled 2021-11-15: qty 50

## 2021-11-15 MED ORDER — FENTANYL CITRATE (PF) 250 MCG/5ML IJ SOLN
INTRAMUSCULAR | Status: AC
  Filled 2021-11-15: qty 5

## 2021-11-15 MED ORDER — MIDAZOLAM HCL 2 MG/2ML IJ SOLN
INTRAMUSCULAR | Status: DC | PRN
  Administered 2021-11-15: 1 mg via INTRAVENOUS

## 2021-11-15 MED ORDER — DEXAMETHASONE SODIUM PHOSPHATE 10 MG/ML IJ SOLN
INTRAMUSCULAR | Status: DC | PRN
  Administered 2021-11-15: 10 mg via INTRAVENOUS

## 2021-11-15 MED ORDER — SODIUM CHLORIDE 0.9 % IV SOLN: INTRAVENOUS | Status: DC

## 2021-11-15 MED ORDER — MIDAZOLAM HCL 2 MG/2ML IJ SOLN
INTRAMUSCULAR | Status: AC
  Filled 2021-11-15: qty 2

## 2021-11-15 MED ORDER — INSULIN ASPART 100 UNIT/ML IJ SOLN: 0.0000 [IU] | INTRAMUSCULAR | Status: DC | PRN

## 2021-11-15 MED ORDER — ACETAMINOPHEN 500 MG PO TABS
1000.0000 mg | ORAL_TABLET | Freq: Once | ORAL | Status: AC
  Administered 2021-11-15: 1000 mg via ORAL
  Filled 2021-11-15: qty 2

## 2021-11-15 MED ORDER — CEFAZOLIN SODIUM-DEXTROSE 2-4 GM/100ML-% IV SOLN
2.0000 g | INTRAVENOUS | Status: AC
  Administered 2021-11-15: 2 g via INTRAVENOUS
  Filled 2021-11-15: qty 100

## 2021-11-15 MED ORDER — PHENYLEPHRINE 40 MCG/ML (10ML) SYRINGE FOR IV PUSH (FOR BLOOD PRESSURE SUPPORT)
PREFILLED_SYRINGE | INTRAVENOUS | Status: DC | PRN
  Administered 2021-11-15 (×2): 80 ug via INTRAVENOUS

## 2021-11-15 MED ORDER — FENTANYL CITRATE (PF) 100 MCG/2ML IJ SOLN
INTRAMUSCULAR | Status: AC
  Filled 2021-11-15: qty 2

## 2021-11-15 MED ORDER — OXYCODONE HCL 5 MG/5ML PO SOLN: 5.0000 mg | Freq: Once | ORAL | Status: DC | PRN

## 2021-11-15 MED ORDER — DEXTROSE 50 % IV SOLN
12.5000 g | Freq: Once | INTRAVENOUS | Status: AC
  Administered 2021-11-15: 12.5 g via INTRAVENOUS

## 2021-11-15 MED ORDER — ORAL CARE MOUTH RINSE: 15.0000 mL | Freq: Once | OROMUCOSAL | Status: AC

## 2021-11-15 MED ORDER — FENTANYL CITRATE (PF) 100 MCG/2ML IJ SOLN
25.0000 ug | INTRAMUSCULAR | Status: DC | PRN
  Administered 2021-11-15 (×2): 50 ug via INTRAVENOUS

## 2021-11-15 MED ORDER — BUPIVACAINE HCL 0.25 % IJ SOLN
INTRAMUSCULAR | Status: DC | PRN
  Administered 2021-11-15: 10 mL

## 2021-11-15 MED ORDER — CHLORHEXIDINE GLUCONATE 0.12 % MT SOLN
15.0000 mL | Freq: Once | OROMUCOSAL | Status: AC
  Administered 2021-11-15: 15 mL via OROMUCOSAL
  Filled 2021-11-15: qty 15

## 2021-11-15 MED ORDER — OXYCODONE HCL 5 MG PO TABS: ORAL_TABLET | ORAL | 0 refills | Status: DC

## 2021-11-15 MED ORDER — FENTANYL CITRATE (PF) 250 MCG/5ML IJ SOLN
INTRAMUSCULAR | Status: DC | PRN
  Administered 2021-11-15: 50 ug via INTRAVENOUS

## 2021-11-15 MED ORDER — LIDOCAINE 2% (20 MG/ML) 5 ML SYRINGE
INTRAMUSCULAR | Status: DC | PRN
  Administered 2021-11-15: 80 mg via INTRAVENOUS

## 2021-11-15 MED ORDER — AMISULPRIDE (ANTIEMETIC) 5 MG/2ML IV SOLN
10.0000 mg | Freq: Once | INTRAVENOUS | Status: DC | PRN
Start: 2021-11-15 — End: 2021-11-16

## 2021-11-15 SURGICAL SUPPLY — 45 items
BAG COUNTER SPONGE SURGICOUNT (BAG) ×3 IMPLANT
BAG SPNG CNTER NS LX DISP (BAG) ×1
BLADE LONG MED 31X9 (MISCELLANEOUS) ×2 IMPLANT
BNDG CMPR 9X4 STRL LF SNTH (GAUZE/BANDAGES/DRESSINGS) ×1
BNDG COHESIVE 1X5 TAN STRL LF (GAUZE/BANDAGES/DRESSINGS) ×3 IMPLANT
BNDG CONFORM 2 STRL LF (GAUZE/BANDAGES/DRESSINGS) ×1 IMPLANT
BNDG ELASTIC 4X5.8 VLCR STR LF (GAUZE/BANDAGES/DRESSINGS) ×1 IMPLANT
BNDG ESMARK 4X9 LF (GAUZE/BANDAGES/DRESSINGS) ×2 IMPLANT
BNDG GAUZE ELAST 4 BULKY (GAUZE/BANDAGES/DRESSINGS) ×1 IMPLANT
CNTNR URN SCR LID CUP LEK RST (MISCELLANEOUS) IMPLANT
CONT SPEC 4OZ STRL OR WHT (MISCELLANEOUS) ×2
CORD BIPOLAR FORCEPS 12FT (ELECTRODE) ×3 IMPLANT
COVER SURGICAL LIGHT HANDLE (MISCELLANEOUS) ×1 IMPLANT
CUFF TOURN SGL QUICK 18X4 (TOURNIQUET CUFF) ×3 IMPLANT
DRSG XEROFORM 1X8 (GAUZE/BANDAGES/DRESSINGS) ×1 IMPLANT
DURAPREP 26ML APPLICATOR (WOUND CARE) ×2 IMPLANT
GAUZE 4X4 16PLY ~~LOC~~+RFID DBL (SPONGE) ×1 IMPLANT
GAUZE SPONGE 4X4 12PLY STRL (GAUZE/BANDAGES/DRESSINGS) ×1 IMPLANT
GAUZE XEROFORM 1X8 LF (GAUZE/BANDAGES/DRESSINGS) ×3 IMPLANT
GLOVE SRG 8 PF TXTR STRL LF DI (GLOVE) ×2 IMPLANT
GLOVE SURG ENC MOIS LTX SZ7.5 (GLOVE) ×2 IMPLANT
GLOVE SURG UNDER POLY LF SZ8 (GLOVE) ×2
GOWN STRL REUS W/ TWL LRG LVL3 (GOWN DISPOSABLE) ×2 IMPLANT
GOWN STRL REUS W/ TWL XL LVL3 (GOWN DISPOSABLE) ×2 IMPLANT
GOWN STRL REUS W/TWL LRG LVL3 (GOWN DISPOSABLE) ×2
GOWN STRL REUS W/TWL XL LVL3 (GOWN DISPOSABLE) ×2
KIT BASIN OR (CUSTOM PROCEDURE TRAY) ×2 IMPLANT
KIT TURNOVER KIT B (KITS) ×2 IMPLANT
LOOP VESSEL MAXI BLUE (MISCELLANEOUS) ×1 IMPLANT
NDL HYPO 25GX1X1/2 BEV (NEEDLE) IMPLANT
NEEDLE HYPO 25GX1X1/2 BEV (NEEDLE) IMPLANT
NS IRRIG 1000ML POUR BTL (IV SOLUTION) ×3 IMPLANT
PACK ORTHO EXTREMITY (CUSTOM PROCEDURE TRAY) ×3 IMPLANT
PAD ARMBOARD 7.5X6 YLW CONV (MISCELLANEOUS) ×4 IMPLANT
PAD CAST 4YDX4 CTTN HI CHSV (CAST SUPPLIES) IMPLANT
PADDING CAST COTTON 4X4 STRL (CAST SUPPLIES) ×2
SPECIMEN JAR SMALL (MISCELLANEOUS) ×2 IMPLANT
SUCTION FRAZIER HANDLE 10FR (MISCELLANEOUS) ×2
SUCTION TUBE FRAZIER 10FR DISP (MISCELLANEOUS) IMPLANT
SUT CHROMIC 6 0 PS 4 (SUTURE) IMPLANT
SUT MON AB 5-0 PS2 18 (SUTURE) ×2 IMPLANT
SUT VICRYL 4-0 PS2 18IN ABS (SUTURE) IMPLANT
SYR CONTROL 10ML LL (SYRINGE) IMPLANT
TOWEL GREEN STERILE (TOWEL DISPOSABLE) ×2 IMPLANT
UNDERPAD 30X36 HEAVY ABSORB (UNDERPADS AND DIAPERS) ×2 IMPLANT

## 2021-11-15 NOTE — Telephone Encounter (Signed)
Ideally the stress test would be done prior to his EGD. Is there any way that can happen? ?If not, the EGD is not urgent, it was going to be done on Plavix, however probably safest to do the stress test first. That will unfortunately significantly delay his EGD however given backlog of cases there. He should have let us know in advance if he was going to have a stress test.  ? ?Otherwise, if he cannot make it, not sure if any of the screenings on the wait list are interested in the open spot, Mr. Upchurch would need to be rescheduled. Thanks ?

## 2021-11-15 NOTE — Discharge Instructions (Signed)

## 2021-11-15 NOTE — H&P (Signed)
?Edward [name] is an 51 y.o. male.   ?Chief Complaint: left index finger necrosis ?HPI: 51 yo male with necrosis left index finger and wound from amputation left ring finger.  He wishes to have amputation left index finger and closure of left hand wound. ? ?Allergies:  ?Allergies  ?Allergen Reactions  ? Crestor [Rosuvastatin] Rash  ?  Also had a red eye  ? Benadryl [Diphenhydramine] Rash  ? ? ?Past Medical History:  ?Diagnosis Date  ? Allergy   ? Anemia   ? getting Venofer with dialysis  ? Anxiety   ? self reported, sometimes-situational anxiety  ? Arthritis   ? bilateral hands/LEFT hip  ? CHF (congestive heart failure) ([place])   ? Chronic kidney disease   ? t th s dialysis- [place] ---ESRD  ? Complication of anesthesia   ? slow to go to sleep  ? Coronary artery disease   ? Depression   ? self reported, sometimes-situational depression  ? Diabetes mellitus   ? type II- on meds  ? Dyspnea   ? sometimes- fluid  ? GERD (gastroesophageal reflux disease)   ? on meds  ? Hypertension   ? no longer on medication since starting on Hemodialysis  ? Myocardial infarction [facility]) 2019  ? Sleep apnea   ? ? ?Past Surgical History:  ?Procedure Laterality Date  ? A/V FISTULAGRAM Left 03/06/2020  ? Procedure: A/V FISTULAGRAM;  Surgeon: Waynetta Sandy, MD;  Location: [place] [facility];  Service: Cardiovascular;  Laterality: Left;  ? ABDOMINAL AORTOGRAM W/LOWER EXTREMITY N/A 04/09/2021  ? Procedure: ABDOMINAL AORTOGRAM W/LOWER EXTREMITY;  Surgeon: Waynetta Sandy, MD;  Location: [place] [facility];  Service: Cardiovascular;  Laterality: N/A;  ? AMPUTATION Left 05/10/2016  ? Procedure: Left Transmetatarsal Amputation;  Surgeon: [name], MD;  Location: [place];  Service: Orthopedics;  Laterality: Left;  ? AMPUTATION Right 05/16/2018  ? Procedure: PARTIAL RAY AMPUTATION FIFTH TOE RIGHT FOOT;  Surgeon: [name], DPM;  Location: [place];  Service: Podiatry;  Laterality: Right;  ? AMPUTATION Right 06/17/2018  ?  Procedure: AMPUTATION 4th RAY Right Foot;  Surgeon: Trula Slade, DPM;  Location: [place];  Service: Podiatry;  Laterality: Right;  ? AMPUTATION Right 06/19/2018  ? Procedure: RIGHT BELOW KNEE AMPUTATION;  Surgeon: [name], MD;  Location: [place];  Service: Orthopedics;  Laterality: Right;  ? AMPUTATION Left 10/05/2021  ? Procedure: LEFT RING FINGER AMPUTATION;  Surgeon: Daryll Brod, MD;  Location: [place];  Service: Orthopedics;  Laterality: Left;  45 MIN  ? APPLICATION OF WOUND VAC Right 06/19/2018  ? Procedure: APPLICATION OF WOUND VAC;  Surgeon: [name], MD;  Location: [place];  Service: Orthopedics;  Laterality: Right;  ? AV FISTULA PLACEMENT Left 11/25/2019  ? Procedure: LEFT ARM Brachiocephalic  ARTERIOVENOUS (AV) FISTULA CREATION;  Surgeon: Rosetta Posner, MD;  Location: [place];  Service: Vascular;  Laterality: Left;  ? BASCILIC VEIN TRANSPOSITION Left 07/19/2020  ? Procedure: LEFT SECOND STAGE BASCILIC VEIN TRANSPOSITION;  Surgeon: Waynetta Sandy, MD;  Location: [place];  Service: Vascular;  Laterality: Left;  ? CARDIAC CATHETERIZATION  07/29/2018  ? CIRCUMCISION  09/02/2011  ? Procedure: CIRCUMCISION ADULT;  Surgeon: Molli Hazard, MD;  Location: [place];  Service: Urology;  Laterality: N/A;  ? CORONARY STENT INTERVENTION N/A 07/29/2018  ? Procedure: CORONARY STENT INTERVENTION;  Surgeon: Leonie Man, MD;  Location: [place] [facility];  Service: Cardiovascular;  Laterality: N/A;  ?  I & D EXTREMITY  09/02/2011  ? Procedure: IRRIGATION AND DEBRIDEMENT EXTREMITY;  Surgeon: Theodoro Kos, DO;  Location: [place];  Service: Plastics;  Laterality: N/A;  ? INCISION AND DRAINAGE OF WOUND  08/12/2011  ? Procedure: IRRIGATION AND DEBRIDEMENT WOUND;  Surgeon: Zenovia Jarred, MD;  Location: [place];  Service: General;;  perineum  ? INSERTION OF DIALYSIS CATHETER N/A 11/25/2019  ? Procedure: INSERTION OF Righ Internal Jugular DIALYSIS CATHETER;  Surgeon: Rosetta Posner, MD;  Location: [place];  Service: Vascular;  Laterality: N/A;  ? [place]  08/12/2011  ? Procedure: IRRIGATION AND DEBRIDEMENT ABSCESS;  Surgeon: Molli Hazard, MD;  Location: [place];  Service: Urology;  Laterality: N/A;  irrigation and debridment perineum    ? IRRIGATION AND DEBRIDEMENT ABSCESS  08/14/2011  ? Procedure: MINOR INCISION AND DRAINAGE OF ABSCESS;  Surgeon: Molli Hazard, MD;  Location: [place];  Service: Urology;  Laterality: N/A;  Perineal Wound Debridement;Placement Bilateral Testicular Thigh Pouches  ? LEFT HEART CATH AND CORONARY ANGIOGRAPHY N/A 07/29/2018  ? Procedure: LEFT HEART CATH AND CORONARY ANGIOGRAPHY;  Surgeon: Leonie Man, MD;  Location: [place] [facility];  Service: Cardiovascular;  Laterality: N/A;  ? PERIPHERAL VASCULAR ATHERECTOMY Left 04/09/2021  ? Procedure: PERIPHERAL VASCULAR ATHERECTOMY;  Surgeon: Waynetta Sandy, MD;  Location: [place] [facility];  Service: Cardiovascular;  Laterality: Left;  TP trunk and peroneal arteries  ? PERIPHERAL VASCULAR BALLOON ANGIOPLASTY Left 04/09/2021  ? Procedure: PERIPHERAL VASCULAR BALLOON ANGIOPLASTY;  Surgeon: Waynetta Sandy, MD;  Location: [place] [facility];  Service: Cardiovascular;  Laterality: Left;  Popliteal  ? REVISON OF ARTERIOVENOUS FISTULA Left 05/24/2020  ? Procedure: LEFT ARM ARTERIOVENOUS FISTULA  CONVERSION TO BASILIC VEIN FISTULA;  Surgeon: Waynetta Sandy, MD;  Location: [place];  Service: Vascular;  Laterality: Left;  ? TOTAL HIP ARTHROPLASTY Left 10/11/2016  ? Procedure: LEFT TOTAL HIP ARTHROPLASTY ANTERIOR APPROACH;  Surgeon: Mcarthur Rossetti, MD;  Location: [facility];  Service: Orthopedics;  Laterality: Left;  ? ? ?Family History: ?Family History  ?Problem Relation Age of Onset  ? Diabetes Other   ? Pancreatic cancer Mother 105  ? Colon cancer Neg Hx   ? Colon polyps Neg Hx   ? Esophageal cancer Neg Hx   ? Rectal cancer Neg Hx   ? Stomach cancer  Neg Hx   ? ? ?Social History:  ? reports that he quit smoking about 1 years ago. His smoking use included cigarettes. He has a 6.00 pack-year smoking history. He has never used smokeless tobacco. He reports that he does not currently use alcohol. He reports that he does not use drugs. ? ?Medications: ?No medications prior to admission.  ? ? ?No results found for this or any previous visit (from the past 48 hour(s)). ? ?No results found. ? ? ? ?Height 6' (1.829 m), weight 104.3 kg. ? ?General appearance: alert, cooperative, and appears stated age ?Head: Normocephalic, without obvious abnormality, atraumatic ?Neck: supple, symmetrical, trachea midline ?Extremities: left hand:  +epl/fpl/io.  Necrosis of distal aspect index finger.  Wound in thumb. ?Pulses: 2+ and symmetric ?Skin: Skin color, texture, turgor normal. No rashes or lesions ?Neurologic: Grossly normal ?Incision/Wound: wound at left ring finger amputation site.   ? ?Assessment/Plan ?Left index finger necrosis and ring finger amputation wound.  Plan amputation left index finger and closure left hand wound.  Non operative and operative treatment options have been discussed with the patient  and patient wishes to proceed with operative treatment. Risks, benefits, and alternatives of surgery have been discussed and the patient agrees with the plan of care.  ? ?Leanora Cover ?11/15/2021, 8:38 AM ? ?

## 2021-11-15 NOTE — Op Note (Signed)
NAME: Edward [name] ?MEDICAL RECORD NO: [id number] ?DATE OF BIRTH: 04-11-1971 ?FACILITY: [facility] ?LOCATION: [facility] OR ?PHYSICIAN: Tennis Must, MD ?  ?OPERATIVE REPORT ?  ?DATE OF PROCEDURE: 11/15/21  ?  ?PREOPERATIVE DIAGNOSIS: Left index finger necrosis and ring finger amputation wound ?  ?POSTOPERATIVE DIAGNOSIS: Left index finger necrosis and ring finger amputation wound ?  ?PROCEDURE: ?1.  Amputation left index finger through PIP joint ?2.  Debridement and closure of left ring finger amputation wound over drain ?  ?SURGEON:  Edward [name], M.D. ?  ?ASSISTANT: none ?  ?ANESTHESIA:  General ?  ?INTRAVENOUS FLUIDS:  Per anesthesia flow sheet. ?  ?ESTIMATED BLOOD LOSS:  Minimal. ?  ?COMPLICATIONS:  None. ?  ?SPECIMENS: Left index finger to pathology for gross exam ?  ?TOURNIQUET TIME:   ? ?Total Tourniquet Time Documented: ?Forearm (Left) - 28 minutes ?Total: Forearm (Left) - 28 minutes ? ?  ?DISPOSITION:  Stable to PACU. ?  ?INDICATIONS: 50 year old male with end-stage renal disease on dialysis.  He has had necrosis of the left index finger.  He wishes to proceed with amputation.  He also has a wound on the hand from a ring finger amputation for necrosis that has been slow to heal.  He wishes to proceed with closure over a drain.  Risks, benefits and alternatives of surgery were discussed including the risks of blood loss, infection, damage to nerves, vessels, tendons, ligaments, bone for surgery, need for additional surgery, complications with wound healing, continued pain, stiffness, need for further amputation.  He voiced understanding of these risks and elected to proceed. ? ?OPERATIVE COURSE:  After being identified preoperatively by myself,  the patient and I agreed on the procedure and site of the procedure.  The surgical site was marked.  Surgical consent had been signed. Preoperative IV antibiotic prophylaxis was given. He was transferred to the operating room and placed on the operating table in supine  position with the left upper extremity on an arm board.  General anesthesia was induced by the anesthesiologist.  The left upper extremity was prepped and draped in normal sterile orthopedic fashion.  A surgical pause was performed between the surgeons, anesthesia, and operating room staff and all were in agreement as to the patient, procedure, and site of procedure.  Tourniquet at the proximal aspect of the forearm was inflated to 250 mmHg after exsanguination of the arm with an Esmarch bandage.  Fishmouth type incision was made at the level of the PIP joint of the index finger.  This removed all necrotic tissue while trying to preserve tissue with healing potential for wound closure.  The radial and ulnar digital nerve and artery were identified treated with bipolar and allowed to retract.  The flexor tendons were sharply divided with the knife.  The finger was amputated through the PIP joint.  The wound was copiously irrigated with sterile saline.  The skin was reapproximated over the end of the proximal phalanx with 5-0 Monocryl suture in an interrupted fashion.  Good tension-free reapposition of tissues was obtained.  Bipolar electrocautery had been used to obtain hemostasis.  The wound from the ring finger amputation was then addressed.  There was no gross purulence.  There was some dried and necrotic tissue from the volar flap.  This was sharply debrided and removed with the knife.  This included skin and subcutaneous tissue.  The wound was then copiously irrigated with sterile saline.  Two 5-0 Monocryl sutures were used in an interrupted fashion to  reapproximate tissue edges.  Blue vessel loop drain was placed into the wound to allow for drainage.  The wounds were all dressed with sterile Xeroform and 4 x 4's and wrapped with a Coban dressing lightly.  The tourniquet was deflated at 28 minutes.  Fingertips were pink with brisk capillary refill after deflation of tourniquet.  The operative  drapes were broken  down.  The patient was awoken from anesthesia safely.  He was transferred back to the stretcher and taken to PACU in stable condition.  I will see him back in the office in 1 week for postoperative followup.  I will give him a prescription for oxycodone 5 mg 1-2 p.o. every 6 hours as needed pain dispense #20. ? ? ?Edward Cover, MD ?Electronically signed, 11/15/21 ?

## 2021-11-15 NOTE — Progress Notes (Signed)
Informed Dr Roderic Palau of patient's calcium level of 0.89 mmo/I/L from 11/15/21 I-Stat drawn.  No order given. ?

## 2021-11-15 NOTE — Progress Notes (Signed)
In PACU patient complaining of right anterior knee/leg pain at site of previous BKA.  Patient said pain began in short stay.  Patient has strong popliteal pulse and right BKA does not appear to be infected.  Dr. Fredna Dow called to bedside and agreed with assessment.  Pain relieved with IV fentanyl.  Of note, patient states he has an appointment with Dr. Donzetta Matters of Vascular surgery next week.  Patient instructed to contact vascular office if pain gets worse.  Patient acknowledges and agrees with plan of care.   ?

## 2021-11-15 NOTE — Transfer of Care (Signed)
Immediate Anesthesia Transfer of Care Note ? ?Patient: Edward [name] ? ?Procedure(s) Performed: LEFT INDEX FINGER AMPUTATION (Left: Hand) ?LEFT HAND WOUND CLOSURE (Left: Hand) ? ?Patient Location: PACU ? ?Anesthesia Type:General ? ?Level of Consciousness: drowsy ? ?Airway & Oxygen Therapy: Patient Spontanous Breathing and Patient connected to nasal cannula oxygen ? ?Post-op Assessment: Report given to RN and Post -op Vital signs reviewed and stable ? ?Post vital signs: Reviewed and stable ? ?Last Vitals:  ?Vitals Value Taken Time  ?BP 112/77 11/15/21 1904  ?Temp    ?Pulse 71 11/15/21 1907  ?Resp 10 11/15/21 1907  ?SpO2 100 % 11/15/21 1907  ?Vitals shown include unvalidated device data. ? ?Last Pain:  ?Vitals:  ? 11/15/21 1513  ?TempSrc:   ?PainSc: 7   ?   ? ?Patients Stated Pain Goal: 3 (11/15/21 1513) ? ?Complications: No notable events documented. ?

## 2021-11-15 NOTE — Anesthesia Postprocedure Evaluation (Signed)
Anesthesia Post Note ? ?Patient: Edward [name] ? ?Procedure(s) Performed: LEFT INDEX FINGER AMPUTATION (Left: Hand) ?LEFT HAND WOUND CLOSURE (Left: Hand) ? ?  ? ?Patient location during evaluation: PACU ?Anesthesia Type: General ?Level of consciousness: sedated and patient cooperative ?Pain management: pain level controlled ?Vital Signs Assessment: post-procedure vital signs reviewed and stable ?Respiratory status: spontaneous breathing ?Cardiovascular status: stable ?Anesthetic complications: no ? ? ?No notable events documented. ? ?Last Vitals:  ?Vitals:  ? 11/15/21 1945 11/15/21 2000  ?BP: 121/72 127/79  ?Pulse: 74 76  ?Resp: 10 13  ?Temp:  36.7 ?C  ?SpO2: 99% 100%  ?  ?Last Pain:  ?Vitals:  ? 11/15/21 1945  ?TempSrc:   ?PainSc: 5   ? ? ?  ?  ?  ?  ?  ?  ? ?Nolon Nations ? ? ? ? ?

## 2021-11-15 NOTE — Telephone Encounter (Signed)
Received a call from Corley with pre-surgical testing, she states that she reached out to patient regarding his upcoming EGD that is scheduled at [facility] on 4/6. Pt reported to [facility] [place] that he thought the EGD was cancelled because they ordered additional cardiac testing. Pt is scheduled for a stress test on 11/22/21 at 7:45 am. Morey Hummingbird reached out to a PA with cardiology and they told us that we can send over a clearance request and they will determine if the stress test needs to be completed sooner so that pt may be able to keep EGD appt as scheduled. How would you like to proceed? ?

## 2021-11-16 ENCOUNTER — Encounter (HOSPITAL_COMMUNITY): Payer: Self-pay | Admitting: Orthopedic Surgery

## 2021-11-16 NOTE — Telephone Encounter (Signed)
Lm on vm for patient to return call 

## 2021-11-16 NOTE — Telephone Encounter (Signed)
Attempted to reach pt twice this afternoon. His vm is full at this time. Unable to leave a vm. ? ?Dr. Havery Moros, do you want me to go ahead and cancel EGD on 4/6 and place alternate patient in this slot? ?

## 2021-11-16 NOTE — Telephone Encounter (Signed)
Hospital EGD on 11/22/21 has been cancelled.  ? ?Patient has been added back to hospital wait list. ?

## 2021-11-19 ENCOUNTER — Ambulatory Visit: Payer: Medicare Other | Admitting: General Practice

## 2021-11-19 ENCOUNTER — Telehealth (HOSPITAL_COMMUNITY): Payer: Self-pay

## 2021-11-19 LAB — SURGICAL PATHOLOGY

## 2021-11-19 NOTE — Telephone Encounter (Signed)
Attempted to contact the patient, per DPR unable to leave a message. Edward [name] EMTP ?

## 2021-11-22 ENCOUNTER — Ambulatory Visit (HOSPITAL_COMMUNITY): Payer: Medicare Other | Attending: Cardiovascular Disease

## 2021-11-22 ENCOUNTER — Encounter (HOSPITAL_COMMUNITY): Admission: RE | Payer: Self-pay | Source: Home / Self Care

## 2021-11-22 ENCOUNTER — Ambulatory Visit (HOSPITAL_COMMUNITY): Admission: RE | Admit: 2021-11-22 | Payer: Medicare Other | Source: Home / Self Care | Admitting: Gastroenterology

## 2021-11-22 SURGERY — ESOPHAGOGASTRODUODENOSCOPY (EGD) WITH PROPOFOL
Anesthesia: Monitor Anesthesia Care

## 2021-11-23 ENCOUNTER — Encounter: Payer: Self-pay | Admitting: General Practice

## 2021-11-26 ENCOUNTER — Encounter (HOSPITAL_COMMUNITY): Payer: Self-pay | Admitting: Cardiology

## 2021-11-28 ENCOUNTER — Emergency Department (HOSPITAL_COMMUNITY)
Admission: EM | Admit: 2021-11-28 | Discharge: 2021-11-28 | Disposition: A | Discharge status: Home / Self Care | Payer: Medicare Other | Attending: Emergency Medicine | Admitting: Emergency Medicine

## 2021-11-28 ENCOUNTER — Encounter (HOSPITAL_COMMUNITY): Payer: Self-pay | Admitting: Emergency Medicine

## 2021-11-28 ENCOUNTER — Other Ambulatory Visit: Payer: Self-pay

## 2021-11-28 ENCOUNTER — Encounter (HOSPITAL_BASED_OUTPATIENT_CLINIC_OR_DEPARTMENT_OTHER): Payer: Medicare Other | Attending: General Surgery | Admitting: General Surgery

## 2021-11-28 ENCOUNTER — Emergency Department (HOSPITAL_COMMUNITY): Payer: Medicare Other

## 2021-11-28 DIAGNOSIS — Z89511 Acquired absence of right leg below knee: Secondary | ICD-10-CM | POA: Insufficient documentation

## 2021-11-28 DIAGNOSIS — Z89432 Acquired absence of left foot: Secondary | ICD-10-CM | POA: Insufficient documentation

## 2021-11-28 DIAGNOSIS — E1122 Type 2 diabetes mellitus with diabetic chronic kidney disease: Secondary | ICD-10-CM | POA: Diagnosis not present

## 2021-11-28 DIAGNOSIS — E1142 Type 2 diabetes mellitus with diabetic polyneuropathy: Secondary | ICD-10-CM | POA: Insufficient documentation

## 2021-11-28 DIAGNOSIS — I429 Cardiomyopathy, unspecified: Secondary | ICD-10-CM | POA: Insufficient documentation

## 2021-11-28 DIAGNOSIS — Z7982 Long term (current) use of aspirin: Secondary | ICD-10-CM | POA: Diagnosis not present

## 2021-11-28 DIAGNOSIS — L97822 Non-pressure chronic ulcer of other part of left lower leg with fat layer exposed: Secondary | ICD-10-CM | POA: Diagnosis present

## 2021-11-28 DIAGNOSIS — N186 End stage renal disease: Secondary | ICD-10-CM | POA: Insufficient documentation

## 2021-11-28 DIAGNOSIS — I5042 Chronic combined systolic (congestive) and diastolic (congestive) heart failure: Secondary | ICD-10-CM | POA: Diagnosis not present

## 2021-11-28 DIAGNOSIS — Z794 Long term (current) use of insulin: Secondary | ICD-10-CM | POA: Insufficient documentation

## 2021-11-28 DIAGNOSIS — I2510 Atherosclerotic heart disease of native coronary artery without angina pectoris: Secondary | ICD-10-CM | POA: Diagnosis not present

## 2021-11-28 DIAGNOSIS — M79604 Pain in right leg: Secondary | ICD-10-CM | POA: Diagnosis present

## 2021-11-28 DIAGNOSIS — I739 Peripheral vascular disease, unspecified: Secondary | ICD-10-CM | POA: Insufficient documentation

## 2021-11-28 DIAGNOSIS — Z7902 Long term (current) use of antithrombotics/antiplatelets: Secondary | ICD-10-CM | POA: Diagnosis not present

## 2021-11-28 DIAGNOSIS — I509 Heart failure, unspecified: Secondary | ICD-10-CM | POA: Diagnosis not present

## 2021-11-28 DIAGNOSIS — E8770 Fluid overload, unspecified: Secondary | ICD-10-CM | POA: Insufficient documentation

## 2021-11-28 DIAGNOSIS — E1165 Type 2 diabetes mellitus with hyperglycemia: Secondary | ICD-10-CM | POA: Insufficient documentation

## 2021-11-28 DIAGNOSIS — Z992 Dependence on renal dialysis: Secondary | ICD-10-CM | POA: Insufficient documentation

## 2021-11-28 DIAGNOSIS — L97111 Non-pressure chronic ulcer of right thigh limited to breakdown of skin: Secondary | ICD-10-CM | POA: Diagnosis not present

## 2021-11-28 LAB — BASIC METABOLIC PANEL
Anion gap: 15 (ref 5–15)
BUN: 39 mg/dL — ABNORMAL HIGH (ref 6–20)
CO2: 29 mmol/L (ref 22–32)
Calcium: 9.4 mg/dL (ref 8.9–10.3)
Chloride: 95 mmol/L — ABNORMAL LOW (ref 98–111)
Creatinine, Ser: 6.74 mg/dL — ABNORMAL HIGH (ref 0.61–1.24)
GFR, Estimated: 9 mL/min — ABNORMAL LOW (ref >60)
Glucose, Bld: 92 mg/dL (ref 70–99)
Potassium: 3.5 mmol/L (ref 3.5–5.1)
Sodium: 139 mmol/L (ref 135–145)

## 2021-11-28 LAB — CBC WITH DIFFERENTIAL/PLATELET
Abs Immature Granulocytes: 0.03 10*3/uL (ref 0.00–0.07)
Basophils Absolute: 0 10*3/uL (ref 0.0–0.1)
Basophils Relative: 1 %
Eosinophils Absolute: 0.2 10*3/uL (ref 0.0–0.5)
Eosinophils Relative: 4 %
HCT: 31.6 % — ABNORMAL LOW (ref 39.0–52.0)
Hemoglobin: 10.1 g/dL — ABNORMAL LOW (ref 13.0–17.0)
Immature Granulocytes: 1 %
Lymphocytes Relative: 10 %
Lymphs Abs: 0.7 10*3/uL (ref 0.7–4.0)
MCH: 33.3 pg (ref 26.0–34.0)
MCHC: 32 g/dL (ref 30.0–36.0)
MCV: 104.3 fL — ABNORMAL HIGH (ref 80.0–100.0)
Monocytes Absolute: 0.6 10*3/uL (ref 0.1–1.0)
Monocytes Relative: 9 %
Neutro Abs: 4.9 10*3/uL (ref 1.7–7.7)
Neutrophils Relative %: 75 %
Platelets: 208 10*3/uL (ref 150–400)
RBC: 3.03 MIL/uL — ABNORMAL LOW (ref 4.22–5.81)
RDW: 17.5 % — ABNORMAL HIGH (ref 11.5–15.5)
WBC: 6.4 10*3/uL (ref 4.0–10.5)
nRBC: 0.5 % — ABNORMAL HIGH (ref 0.0–0.2)

## 2021-11-28 LAB — LACTIC ACID, PLASMA: Lactic Acid, Venous: 1.6 mmol/L (ref 0.5–1.9)

## 2021-11-28 MED ORDER — OXYCODONE HCL 5 MG PO TABS: 5.0000 mg | ORAL_TABLET | Freq: Four times a day (QID) | ORAL | 0 refills | Status: AC | PRN

## 2021-11-28 MED ORDER — FENTANYL CITRATE PF 50 MCG/ML IJ SOSY
50.0000 ug | PREFILLED_SYRINGE | Freq: Once | INTRAMUSCULAR | Status: AC
  Administered 2021-11-28: 50 ug via INTRAVENOUS
  Filled 2021-11-28: qty 1

## 2021-11-28 MED ORDER — MORPHINE SULFATE (PF) 4 MG/ML IV SOLN: 4.0000 mg | Freq: Once | INTRAVENOUS | Status: DC

## 2021-11-28 NOTE — ED Triage Notes (Signed)
BIB EMS from home, went to wound care today for R leg, had pain when he left but it got much worse after he got home. R leg pain (BKA).  ? ?Ems gave 100 mcg Fentanyl IV.  ? ?BP 140/72 ?90 HR ?95% Ra ?RR 20 ?

## 2021-11-28 NOTE — Discharge Instructions (Addendum)
As we discussed, your work-up was reassuring for acute abnormalities today. I have given you a prescription for management of your pain. Please take this as prescribed for severe pain. Follow-up with your PCP in the next few days for continued evaluation and management of your symptoms. ? ?Return if development of any new or worsening symptoms. ?

## 2021-11-28 NOTE — ED Provider Notes (Signed)
?[facility] DEPT ?Provider Note ? ? ?CSN: [id number] ?Arrival date & time: 11/28/21  1808 ? ?  ? ?History ? ?Chief Complaint  ?Patient presents with  ? Leg Pain  ? ? ?Edward [name] is a 51 y.o. male. ? ?Patient with history of diabetes, ESRD on hemodialysis, Fournier's gangrene, left sided BKA, left transmetatarsal foot amputation, CHF presents today with complaints of right thigh pain. States that he has several chronic wounds on his bilateral lower extremities which he sees wound care for.  States he was at wound care earlier today and had dressing change.  Around 4 PM today he started having sudden onset sharp stabbing pain to the his anterior thigh which he states 'felt like I was being operated on.'  States that he has never felt pain like this before.  States that he is normally on oxycodone and tramadol for his pain but recently ran out and has been unable to get his medication refilled yet.  Denies any fevers, chills. Does state that his leg is more swollen than baseline, however he states that this has been a progressive problem since he was on antibiotics following his left hand amputation. States that swelling is bilateral and denies any new worsening of swelling recently.   ? ?The history is provided by the patient. No language interpreter was used.  ?Leg Pain ?Associated symptoms: no fever   ? ?  ? ?Home Medications ?Prior to Admission medications   ?Medication Sig Start Date End Date Taking? Authorizing Provider  ?acetaminophen (TYLENOL) 500 MG tablet Take 500-1,000 mg by mouth every 6 (six) hours as needed for moderate pain or headache.    [provider]  ?aspirin EC 81 MG tablet Take 1 tablet (81 mg total) by mouth daily. Swallow whole. 03/14/20   Lelon Perla, MD  ?atorvastatin (LIPITOR) 80 MG tablet Take 1 tablet (80 mg total) by mouth daily. ?Patient taking differently: Take 80 mg by mouth at bedtime. 09/21/21 12/20/21  Lelon Perla, MD  ?bismuth  subsalicylate (PEPTO BISMOL) 262 MG/15ML suspension Take 30 mLs by mouth every 6 (six) hours as needed for indigestion.    [provider]  ?blood glucose meter kit and supplies Dispense based on patient and insurance preference. Use up to four times daily as directed. (FOR ICD-9 250.00, 250.01). 07/01/18   Angiulli, Lavon Paganini, PA-C  ?clopidogrel (PLAVIX) 75 MG tablet Take 1 tablet (75 mg total) by mouth daily. ?Patient not taking: Reported on 11/12/2021 04/09/21 04/09/22  Waynetta Sandy, MD  ?insulin aspart protamine - aspart (NOVOLOG MIX 70/30 FLEXPEN) (70-30) 100 UNIT/ML FlexPen Inject 0.3 mLs (30 Units total) into the skin 2 (two) times daily with a meal. 09/29/20   Lavina Hamman, MD  ?Insulin Pen Needle 31G X 5 MM MISC Use daily to inject insulin as instructed 07/01/18   Angiulli, Lavon Paganini, PA-C  ?lidocaine-prilocaine (EMLA) cream Apply 1 application topically See admin instructions. Apply a small amount to access site (AVF) 1-2 hours before dialysis. Cover with occlusive dressing (saran wrap). ?Tuesday,Thursday,saturday 03/22/21   [provider]  ?linaclotide Rolan Lipa) 145 MCG CAPS capsule Take 1 capsule (145 mcg total) by mouth daily before breakfast. ?Patient not taking: Reported on 09/21/2021 12/13/20   Yetta Flock, MD  ?linaclotide Rolan Lipa) 72 MCG capsule Take 1 capsule (72 mcg total) by mouth daily before breakfast. ?Patient taking differently: Take 72 mcg by mouth daily as needed (constipation). 12/13/20   Armbruster, Carlota Raspberry, MD  ?oxyCODONE (  ROXICODONE) 5 MG immediate release tablet 1-2 tabs PO q6 hours prn pain 11/15/21   Leanora Cover, MD  ?pantoprazole (PROTONIX) 40 MG tablet Take 1 tablet (40 mg total) by mouth in the morning and at bedtime. ?Patient taking differently: Take 40 mg by mouth daily. 08/29/21   Willia Craze, NP  ?polyethylene glycol (MIRALAX) 17 g packet Take 17 g by mouth daily. ?Patient not taking: Reported on 09/21/2021 12/13/20   Yetta Flock,  MD  ?predniSONE (DELTASONE) 20 MG tablet Take 1 tablet by mouth in the morning and at bedtime. ?Patient not taking: Reported on 09/21/2021 08/14/21   [provider]  ?sulfamethoxazole-trimethoprim (BACTRIM DS) 800-160 MG tablet Take 1 tablet by mouth 2 (two) times daily. 10/26/21   [provider]  ?triamcinolone ointment (KENALOG) 0.1 % 1 application. daily as needed (rash). 09/04/21   [provider]  ?VELPHORO 500 MG chewable tablet Chew 1,000 mg by mouth 3 (three) times daily with meals. 06/14/20   [provider]  ?   ? ?Allergies    ?Crestor [rosuvastatin], Dilaudid [hydromorphone], and Benadryl [diphenhydramine]   ? ?Review of Systems   ?Review of Systems  ?Constitutional:  Negative for chills and fever.  ?Respiratory:  Negative for shortness of breath.   ?Cardiovascular:  Negative for chest pain.  ?Gastrointestinal:  Negative for abdominal pain, diarrhea, nausea and vomiting.  ?Musculoskeletal:  Positive for arthralgias and myalgias.  ?Skin:  Positive for wound. Negative for rash.  ?All other systems reviewed and are negative. ? ?Physical Exam ?Updated Vital Signs ?BP 133/84 (BP Location: Right Arm)   Pulse 85   Temp 98.3 ?F (36.8 ?C) (Oral)   Resp 18   Ht 6' (1.829 m)   Wt 104 kg   BMI 31.10 kg/m?  ?Physical Exam ?Vitals and nursing note reviewed.  ?Constitutional:   ?   General: He is not in acute distress. ?   Appearance: Normal appearance. He is normal weight. He is not ill-appearing, toxic-appearing or diaphoretic.  ?HENT:  ?   Head: Normocephalic and atraumatic.  ?Cardiovascular:  ?   Rate and Rhythm: Normal rate.  ?Pulmonary:  ?   Effort: Pulmonary effort is normal. No respiratory distress.  ?Musculoskeletal:  ?   Cervical back: Normal range of motion.  ?   Comments: Several healing ulcerations noted to the right lower leg. See images below for further. ? ?No palpable crepitus, fluctuance, or induration noted to the anterior thigh.  Significant tenderness  tracking along the right anterior thigh, no tenderness to the posterior region.  No erythema or warmth.  Full ROM intact to the right knee without pain.  ?Skin: ?   General: Skin is warm and dry.  ?Neurological:  ?   General: No focal deficit present.  ?   Mental Status: He is alert.  ?Psychiatric:     ?   Mood and Affect: Mood normal.     ?   Behavior: Behavior normal.  ? ? ? ?ED Results / Procedures / Treatments   ?Labs ?(all labs ordered are listed, but only abnormal results are displayed) ?Labs Reviewed  ?CBC WITH DIFFERENTIAL/PLATELET - Abnormal; Notable for the following components:  ?    Result Value  ? RBC 3.03 (*)   ? Hemoglobin 10.1 (*)   ? HCT 31.6 (*)   ? MCV 104.3 (*)   ? RDW 17.5 (*)   ? nRBC 0.5 (*)   ? All other components within normal limits  ?BASIC METABOLIC PANEL -  Abnormal; Notable for the following components:  ? Chloride 95 (*)   ? BUN 39 (*)   ? Creatinine, Ser 6.74 (*)   ? GFR, Estimated 9 (*)   ? All other components within normal limits  ?CULTURE, BLOOD (SINGLE)  ?LACTIC ACID, PLASMA  ? ? ?EKG ?None ? ?Radiology ?CT EXTREMITY LOWER RIGHT WO CONTRAST ? ?Result Date: 11/28/2021 ?CLINICAL DATA:  Soft tissue infection suspected, thigh, xray done Evaluate for soft tissue abscess/collection in anterior medial thigh (proximal to knee) - acute worsening tenderness here, with hx of recurring skin infections/nec fasc EXAM: CT OF THE LOWER RIGHT EXTREMITY WITHOUT CONTRAST TECHNIQUE: Multidetector CT imaging of the right lower extremity was performed according to the standard protocol. RADIATION DOSE REDUCTION: This exam was performed according to the departmental dose-optimization program which includes automated exposure control, adjustment of the mA and/or kV according to patient size and/or use of iterative reconstruction technique. COMPARISON:  No comparison exams available. FINDINGS: Bones/Joint/Cartilage No erosion, periosteal reaction, or bone destruction. No fracture. Right hip osteoarthritis.  No joint effusion. Below the knee amputation. Ligaments Suboptimally assessed by CT. Muscles and Tendons There is perifascial fluid about the posterior and lateral compartment of the thigh. No evidence of drainable int

## 2021-11-28 NOTE — Progress Notes (Addendum)
Edward, [name] ([id number]) ?Visit Report for 11/28/2021 ?Allergy List Details ?Patient Name: Date of Service: ?Edward [name] MES L. 11/28/2021 9:00 A M ?Medical Record Number: [id number] ?Patient Account Number: [id number] ?Date of Birth/Sex: Treating RN: ?25-Nov-1970 (51 y.o. Male) Baruch Gouty ?Primary Care Jason Frisbee: [name] Other Clinician: ?Referring Brooklyn Alfredo: ?Treating Nelton Amsden/Extender: Fredirick Maudlin ?[name] K ?Weeks in Treatment: 0 ?Allergies ?Active Allergies ?Crestor ?Reaction: Rash ?Dilaudid ?Reaction: itching ?Benadryl ?Reaction: rash ?Allergy Notes ?Electronic Signature(s) ?Signed: 11/28/2021 6:08:52 PM By: Baruch Gouty RN, BSN ?Entered By: Baruch Gouty on 11/28/2021 10:03:00 ?-------------------------------------------------------------------------------- ?Arrival Information Details ?Patient Name: Date of Service: ?Edward [name] MES L. 11/28/2021 9:00 A M ?Medical Record Number: [id number] ?Patient Account Number: [id number] ?Date of Birth/Sex: Treating RN: ?[birth year] (51 y.o. Male) ?Primary Care Hazel Wrinkle: [name] Other Clinician: ?Referring Joandy Burget: ?Treating Faun Mcqueen/Extender: Fredirick Maudlin ?[name] K ?Weeks in Treatment: 0 ?Visit Information ?Patient Arrived: Wheel Chair ?Arrival Time: 09:24 ?Accompanied By: son ?Transfer Assistance: None ?Patient Identification Verified: Yes ?Secondary Verification Process Completed: Yes ?Patient Requires Transmission-Based Precautions: No ?History Since Last Visit ?Added or deleted any medications: No ?Any new allergies or adverse reactions: No ?Had a fall or experienced change in activities of daily living that may affect risk of falls: No ?Signs or symptoms of abuse/neglect since last visito No ?Hospitalized since last visit: No ?Implantable device outside of the clinic excluding cellular tissue based products placed in the center since last visit: No ?Has Dressing in Place as Prescribed: Yes ?Electronic Signature(s) ?Signed:  11/28/2021 9:44:03 AM By: Sandre Kitty ?Entered By: Sandre Kitty on 11/28/2021 09:25:24 ?-------------------------------------------------------------------------------- ?Clinic Level of Care Assessment Details ?Patient Name: Date of Service: ?Edward [name] MES L. 11/28/2021 9:00 A M ?Medical Record Number: [id number] ?Patient Account Number: [id number] ?Date of Birth/Sex: Treating RN: ?[birth year] (51 y.o. Male) Baruch Gouty ?Primary Care Orien Mayhall: [name] Other Clinician: ?Referring Emelia Sandoval: ?Treating Natallie Ravenscroft/Extender: Fredirick Maudlin ?[name] K ?Weeks in Treatment: 0 ?Clinic Level of Care Assessment Items ?TOOL 1 Quantity Score ?'[]'$  - 0 ?Use when EandM and Procedure is performed on INITIAL visit ?ASSESSMENTS - Nursing Assessment / Reassessment ?X- 1 20 ?General Physical Exam (combine w/ comprehensive assessment (listed just below) when performed on new pt. evals) ?X- 1 25 ?Comprehensive Assessment (HX, ROS, Risk Assessments, Wounds Hx, etc.) ?ASSESSMENTS - Wound and Skin Assessment / Reassessment ?'[]'$  - 0 ?Dermatologic / Skin Assessment (not related to wound area) ?ASSESSMENTS - Ostomy and/or Continence Assessment and Care ?'[]'$  - 0 ?Incontinence Assessment and Management ?'[]'$  - 0 ?Ostomy Care Assessment and Management (repouching, etc.) ?PROCESS - Coordination of Care ?X - Simple Patient / Family Education for ongoing care 1 15 ?'[]'$  - 0 ?Complex (extensive) Patient / Family Education for ongoing care ?X- 1 10 ?Staff obtains Consents, Records, T Results / Process Orders ?est ?X- 1 10 ?Staff telephones HHA, Nursing Homes / Clarify orders / etc ?'[]'$  - 0 ?Routine Transfer to another Facility (non-emergent condition) ?'[]'$  - 0 ?Routine Hospital Admission (non-emergent condition) ?X- 1 15 ?New Admissions / Biomedical engineer / Ordering NPWT Apligraf, etc. ?, ?'[]'$  - 0 ?Emergency Hospital Admission (emergent condition) ?PROCESS - Special Needs ?'[]'$  - 0 ?Pediatric / Minor Patient Management ?'[]'$  -  0 ?Isolation Patient Management ?'[]'$  - 0 ?Hearing / Language / Visual special needs ?'[]'$  - 0 ?Assessment of Community assistance (transportation, D/C planning, etc.) ?'[]'$  - 0 ?Additional assistance / Altered mentation ?'[]'$  - 0 ?Support Surface(s) Assessment (bed, cushion, seat, etc.) ?INTERVENTIONS - Miscellaneous ?'[]'$  - 0 ?External  ear exam ?'[]'$  - 0 ?Patient Transfer (multiple staff / Civil Service fast streamer / Similar devices) ?'[]'$  - 0 ?Simple Staple / Suture removal (25 or less) ?'[]'$  - 0 ?Complex Staple / Suture removal (26 or more) ?'[]'$  - 0 ?Hypo/Hyperglycemic Management (do not check if billed separately) ?'[]'$  - 0 ?Ankle / Brachial Index (ABI) - do not check if billed separately ?Has the patient been seen at the hospital within the last three years: Yes ?Total Score: 95 ?Level Of Care: New/Established - Level 3 ?Electronic Signature(s) ?Signed: 12/17/2021 5:26:53 PM By: Baruch Gouty RN, BSN ?Entered By: Baruch Gouty on 12/13/2021 12:24:47 ?-------------------------------------------------------------------------------- ?Lower Extremity Assessment Details ?Patient Name: ?Date of Service: ?Edward [name] MES L. 11/28/2021 9:00 A M ?Medical Record Number: [id number] ?Patient Account Number: [id number] ?Date of Birth/Sex: ?Treating RN: ?1970-09-11 (51 y.o. Male) Baruch Gouty ?Primary Care Bowen Goyal: [name] ?Other Clinician: ?Referring Proctor Carriker: ?Treating Ranald Alessio/Extender: Fredirick Maudlin ?[name] K ?Weeks in Treatment: 0 ?Edema Assessment ?Assessed: [Left: No] [Right: No] ?Edema: [Left: Ye] [Right: s] ?Calf ?Left: Right: ?Point of Measurement: From Medial Instep 41.5 cm ?Ankle ?Left: Right: ?Point of Measurement: From Medial Instep 24.8 cm ?Knee To Floor ?Left: Right: ?From Medial Instep 46 cm ?Vascular Assessment ?Pulses: ?Dorsalis Pedis ?Palpable: [Left:No] ?Doppler Audible: [Left:Yes] ?Posterior Tibial ?Palpable: [Left:No] ?Doppler Audible: [Left:Inaudible] ?Blood Pressure: ?Brachial: [Left:125] ?Ankle: ?[Left:Dorsalis  Pedis: 118 0.94] ?Electronic Signature(s) ?Signed: 11/28/2021 6:08:52 PM By: Baruch Gouty RN, BSN ?Entered By: Baruch Gouty on 11/28/2021 10:28:44 ?-------------------------------------------------------------------------------- ?Multi Wound Chart Details ?Patient Name: ?Date of Service: ?Edward [name] MES L. 11/28/2021 9:00 A M ?Medical Record Number: [id number] ?Patient Account Number: [id number] ?Date of Birth/Sex: ?Treating RN: ?1971-01-04 (50 y.o. Male) ?Primary Care Edmund Rick: [name] ?Other Clinician: ?Referring Moss Berry: ?Treating Emilija Bohman/Extender: Fredirick Maudlin ?[name] K ?Weeks in Treatment: 0 ?Vital Signs ?Height(in): 72 ?Pulse(bpm): 83 ?Weight(lbs): 230 ?Blood Pressure(mmHg): 125/79 ?Body Mass Index(BMI): 31.2 ?Temperature(??F): 98.0 ?Respiratory Rate(breaths/min): 18 ?Photos: [4:No Photos Left, Anterior Lower Leg] [5:No Photos Left, Distal, Anterior Lower Leg] [6:No Photos Left, Medial Lower Leg] ?Wound Location: [4:Blister] [5:Blister] [6:Blister] ?Wounding Event: [4:Diabetic Wound/Ulcer of the Lower] [5:Diabetic Wound/Ulcer of the Lower] [6:Diabetic Wound/Ulcer of the Lower] ?Primary Etiology: [4:Extremity Cataracts, Anemia, Sleep Apnea,] [5:Extremity Cataracts, Anemia, Sleep Apnea,] [6:Extremity Cataracts, Anemia, Sleep Apnea,] ?Comorbid History: [4:Arrhythmia, Congestive Heart Failure, Arrhythmia, Congestive Heart Failure, Arrhythmia, Congestive Heart Failure, Coronary Artery Disease, Hypertension, Myocardial Infarction, Hypertension, Myocardial Infarction, Hypertension,  ?Myocardial Infarction, Peripheral Venous Disease, Type II Peripheral Venous Disease, Type II Peripheral Venous Disease, Type II Diabetes, End Stage Renal Disease, Diabetes, End Stage Renal Disease, Diabetes, End Stage Renal Disease, Osteoarthritis,  ?Osteomyelitis, Neuropathy, Confinement Anxiety 09/19/2021] [5:Coronary Artery Disease, Osteoarthritis, Osteomyelitis, Neuropathy, Confinement Anxiety 09/19/2021] [6:Coronary  Artery Disease, Osteoarthritis, Osteomyelitis, Neuropathy, Confinement Anxiety  ?09/19/2021] ?Date Acquired: [4:0] [5:0] [6:0] ?Weeks of Treatment: [4:Open] [5:Open] [6:Open] ?Wound Status: [4:No] [5:No] [6:No] ?Wound Rec

## 2021-11-28 NOTE — Progress Notes (Signed)
KYSON, KUPPER ([id number]) ?Visit Report for 11/28/2021 ?Abuse Risk Screen Details ?Patient Name: Date of Service: ?Edward Mayo MES L. 11/28/2021 9:00 A M ?Medical Record Number: [id number] ?Patient Account Number: [id number] ?Date of Birth/Sex: Treating RN: ?1970-08-28 (51 y.o. Edward [name] ?Primary Care Kiora Hallberg: [name] Other Clinician: ?Referring Almadelia Looman: ?Treating Dashawn Golda/Extender: Fredirick Maudlin ?[name] K ?Weeks in Treatment: 0 ?Abuse Risk Screen Items ?Answer ?ABUSE RISK SCREEN: ?Has anyone close to you tried to hurt or harm you recentlyo No ?Do you feel uncomfortable with anyone in your familyo No ?Has anyone forced you do things that you didnt want to doo No ?Electronic Signature(s) ?Signed: 11/28/2021 6:08:52 PM By: Baruch Gouty RN, BSN ?Entered By: Baruch Gouty on 11/28/2021 10:15:03 ?-------------------------------------------------------------------------------- ?Activities of Daily Living Details ?Patient Name: Date of Service: ?Edward Mayo MES L. 11/28/2021 9:00 A M ?Medical Record Number: [id number] ?Patient Account Number: [id number] ?Date of Birth/Sex: Treating RN: ?[birth year] (51 y.o. Edward [name] ?Primary Care Rosy Estabrook: [name] Other Clinician: ?Referring Ariyon Mittleman: ?Treating Oslo Huntsman/Extender: Fredirick Maudlin ?[name] K ?Weeks in Treatment: 0 ?Activities of Daily Living Items ?Answer ?Activities of Daily Living (Please select one for each item) ?Drive Automobile Not Able ?T Medications ?ake Completely Able ?Use T elephone Completely Able ?Care for Appearance Need Assistance ?Use T oilet Completely Able ?Bath / Shower Need Assistance ?Dress Self Need Assistance ?Feed Self Completely Able ?Walk Not Able ?Get In / Out Bed Completely Able ?Housework Need Assistance ?Prepare Meals Need Assistance ?Handle Money Completely Able ?Shop for Self Need Assistance ?Electronic Signature(s) ?Signed: 11/28/2021 6:08:52 PM By: Baruch Gouty RN, BSN ?Entered By: Baruch Gouty on 11/28/2021 10:15:58 ?-------------------------------------------------------------------------------- ?Education Screening Details ?Patient Name: ?Date of Service: ?Edward Mayo MES L. 11/28/2021 9:00 A M ?Medical Record Number: [id number] ?Patient Account Number: [id number] ?Date of Birth/Sex: ?Treating RN: ?April 17, 1971 (51 y.o. Edward [name] ?Primary Care Kanetra Ho: [name] ?Other Clinician: ?Referring Leilanny Fluitt: ?Treating Eldred Sooy/Extender: Fredirick Maudlin ?[name] K ?Weeks in Treatment: 0 ?Primary Learner Assessed: Patient ?Learning Preferences/Education Level/Primary Language ?Learning Preference: Explanation, Demonstration, Printed Material ?Highest Education Level: High School ?Preferred Language: English ?Cognitive Barrier ?Language Barrier: No ?Translator Needed: No ?Memory Deficit: No ?Emotional Barrier: No ?Cultural/Religious Beliefs Affecting Medical Care: No ?Physical Barrier ?Impaired Vision: Yes Glasses ?Impaired Hearing: No ?Decreased Hand dexterity: No ?Knowledge/Comprehension ?Knowledge Level: High ?Comprehension Level: High ?Ability to understand written instructions: High ?Ability to understand verbal instructions: High ?Motivation ?Anxiety Level: Calm ?Cooperation: Cooperative ?Education Importance: Acknowledges Need ?Interest in Health Problems: Asks Questions ?Perception: Coherent ?Willingness to Engage in Self-Management High ?Activities: ?Readiness to Engage in Self-Management High ?Activities: ?Electronic Signature(s) ?Signed: 11/28/2021 6:08:52 PM By: Baruch Gouty RN, BSN ?Entered By: Baruch Gouty on 11/28/2021 10:17:18 ?-------------------------------------------------------------------------------- ?Fall Risk Assessment Details ?Patient Name: ?Date of Service: ?Edward Mayo MES L. 11/28/2021 9:00 A M ?Medical Record Number: [id number] ?Patient Account Number: [id number] ?Date of Birth/Sex: ?Treating RN: ?1970/09/08 (51 y.o. Edward [name] ?Primary Care Anjolina Byrer:  [name] ?Other Clinician: ?Referring Nyx Keady: ?Treating Rj Pedrosa/Extender: Fredirick Maudlin ?[name] K ?Weeks in Treatment: 0 ?Fall Risk Assessment Items ?Have you had 2 or more falls in the last 12 monthso 0 Yes ?Have you had any fall that resulted in injury in the last 12 monthso 0 No ?FALLS RISK SCREEN ?History of falling - immediate or within 3 months 25 Yes ?Secondary diagnosis (Do you have 2 or more medical diagnoseso) 0 No ?Ambulatory aid ?None/bed rest/wheelchair/nurse 0 Yes ?Crutches/cane/walker 0 No ?Furniture 0 No ?  Intravenous therapy Access/Saline/Heparin Lock 0 No ?Gait/Transferring ?Normal/ bed rest/ wheelchair 0 Yes ?Weak (short steps with or without shuffle, stooped but able to lift head while walking, may seek 0 No ?support from furniture) ?Impaired (short steps with shuffle, may have difficulty arising from chair, head down, impaired 0 No ?balance) ?Mental Status ?Oriented to own ability 0 Yes ?Electronic Signature(s) ?Signed: 11/28/2021 6:08:52 PM By: Baruch Gouty RN, BSN ?Entered By: Baruch Gouty on 11/28/2021 10:17:48 ?-------------------------------------------------------------------------------- ?Foot Assessment Details ?Patient Name: ?Date of Service: ?Edward Mayo MES L. 11/28/2021 9:00 A M ?Medical Record Number: [id number] ?Patient Account Number: [id number] ?Date of Birth/Sex: ?Treating RN: ?[birth year] (51 y.o. Edward [name] ?Primary Care Deaisha Welborn: [name] ?Other Clinician: ?Referring Zaelynn Fuchs: ?Treating Nattalie Santiesteban/Extender: Fredirick Maudlin ?[name] K ?Weeks in Treatment: 0 ?Foot Assessment Items ?'[x]'$  Unable to perform right foot assessment due to amputation ?Site Locations ?+ = Sensation present, - = Sensation absent, C = Callus, U = Ulcer ?R = Redness, W = Warmth, M = Maceration, PU = Pre-ulcerative lesion ?F = Fissure, S = Swelling, D = Dryness ?Assessment ?Right: Left: ?Other Deformity: No ?Prior Foot Ulcer: Yes ?Prior Amputation: Yes ?Charcot Joint:  No ?Ambulatory Status: Non-ambulatory ?Assistance Device: Wheelchair ?Gait: ?Electronic Signature(s) ?Signed: 11/28/2021 6:08:52 PM By: Baruch Gouty RN, BSN ?Entered By: Baruch Gouty on 11/28/2021 10:20:18 ?-------------------------------------------------------------------------------- ?Nutrition Risk Screening Details ?Patient Name: ?Date of Service: ?Edward Mayo MES L. 11/28/2021 9:00 A M ?Medical Record Number: [id number] ?Patient Account Number: [id number] ?Date of Birth/Sex: ?Treating RN: ?Dec 09, 1970 (51 y.o. Edward [name] ?Primary Care Loralye Loberg: [name] ?Other Clinician: ?Referring Essa Wenk: ?Treating Kiefer Opheim/Extender: Fredirick Maudlin ?[name] K ?Weeks in Treatment: 0 ?Height (in): 72 ?Weight (lbs): 230 ?Body Mass Index (BMI): 31.2 ?Nutrition Risk Screening Items ?Score Screening ?NUTRITION RISK SCREEN: ?I have an illness or condition that made me change the kind and/or amount of food I eat 0 No ?I eat fewer than two meals per day 0 No ?I eat few fruits and vegetables, or milk products 0 No ?I have three or more drinks of beer, liquor or wine almost every day 0 No ?I have tooth or mouth problems that make it hard for me to eat 0 No ?I don't always have enough money to buy the food I need 0 No ?I eat alone most of the time 0 No ?I take three or more different prescribed or over-the-counter drugs a day 1 Yes ?Without wanting to, I have lost or gained 10 pounds in the last six months 0 No ?I am not always physically able to shop, cook and/or feed myself 0 No ?Nutrition Protocols ?Good Risk Protocol 0 No interventions needed ?Moderate Risk Protocol ?High Risk Proctocol ?Risk Level: Good Risk ?Score: 1 ?Electronic Signature(s) ?Signed: 11/28/2021 6:08:52 PM By: Baruch Gouty RN, BSN ?Entered By: Baruch Gouty on 11/28/2021 10:18:26 ?

## 2021-11-28 NOTE — Progress Notes (Addendum)
ALGENIS, BALLIN ([id number]) ?Visit Report for 11/28/2021 ?Chief Complaint Document Details ?Patient Name: Date of Service: ?Edward [name] MES L. 11/28/2021 9:00 A M ?Medical Record Number: [id number] ?Patient Account Number: [id number] ?Date of Birth/Sex: Treating RN: ?1971-01-08 (51 y.o. M) ?Primary Care Provider: [name] Other Clinician: ?Referring Provider: ?Treating Provider/Extender: Fredirick Maudlin ?[name] K ?Weeks in Treatment: 0 ?Information Obtained from: Patient ?Chief Complaint ?Patients presents for treatment of an open diabetic ulcer ?Electronic Signature(s) ?Signed: 11/28/2021 12:30:28 PM By: Fredirick Maudlin MD FACS ?Entered By: Fredirick Maudlin on 11/28/2021 12:30:28 ?-------------------------------------------------------------------------------- ?Debridement Details ?Patient Name: Date of Service: ?Edward [name] MES L. 11/28/2021 9:00 A M ?Medical Record Number: [id number] ?Patient Account Number: [id number] ?Date of Birth/Sex: Treating RN: ?07/20/1971 (51 y.o. Edward [name] ?Primary Care Provider: [name] Other Clinician: ?Referring Provider: ?Treating Provider/Extender: Fredirick Maudlin ?[name] K ?Weeks in Treatment: 0 ?Debridement Performed for Assessment: Wound #5 Left,Distal,Anterior Lower Leg ?Performed By: Physician Fredirick Maudlin, MD ?Debridement Type: Debridement ?Severity of Tissue Pre Debridement: Fat layer exposed ?Level of Consciousness (Pre-procedure): Awake and Alert ?Pre-procedure Verification/Time Out Yes - 10:50 ?Taken: ?Start Time: 10:52 ?Pain Control: ?Other : benzocaine 20% spray ?T Area Debrided (L x W): ?otal 1.2 (cm) x 2.4 (cm) = 2.88 (cm?) ?Tissue and other material debrided: Viable, Non-Viable, Slough, Subcutaneous, Slough ?Level: Skin/Subcutaneous Tissue ?Debridement Description: Excisional ?Instrument: Curette ?Bleeding: Minimum ?Hemostasis Achieved: Pressure ?Procedural Pain: 0 ?Post Procedural Pain: 0 ?Response to Treatment: Procedure was tolerated  well ?Level of Consciousness (Post- Awake and Alert ?procedure): ?Post Debridement Measurements of Total Wound ?Length: (cm) 1.2 ?Width: (cm) 2.4 ?Depth: (cm) 0.1 ?Volume: (cm?) 0.226 ?Character of Wound/Ulcer Post Debridement: Requires Further Debridement ?Severity of Tissue Post Debridement: Fat layer exposed ?Post Procedure Diagnosis ?Same as Pre-procedure ?Electronic Signature(s) ?Signed: 11/28/2021 2:42:57 PM By: Fredirick Maudlin MD FACS ?Signed: 11/28/2021 6:08:52 PM By: Baruch Gouty RN, BSN ?Entered By: Baruch Gouty on 11/28/2021 10:58:29 ?-------------------------------------------------------------------------------- ?Debridement Details ?Patient Name: ?Date of Service: ?Edward [name] MES L. 11/28/2021 9:00 A M ?Medical Record Number: [id number] ?Patient Account Number: [id number] ?Date of Birth/Sex: ?Treating RN: ?[birth year] (51 y.o. Edward [name] ?Primary Care Provider: [name] ?Other Clinician: ?Referring Provider: ?Treating Provider/Extender: Fredirick Maudlin ?[name] K ?Weeks in Treatment: 0 ?Debridement Performed for Assessment: Wound #4 Left,Anterior Lower Leg ?Performed By: Physician Fredirick Maudlin, MD ?Debridement Type: Debridement ?Severity of Tissue Pre Debridement: Fat layer exposed ?Level of Consciousness (Pre-procedure): Awake and Alert ?Pre-procedure Verification/Time Out Yes - 10:50 ?Taken: ?Start Time: 10:52 ?Pain Control: ?Other : benzocaine 20% spray ?T Area Debrided (L x W): ?otal 0.8 (cm) x 1.5 (cm) = 1.2 (cm?) ?Tissue and other material debrided: Viable, Non-Viable, Slough, Subcutaneous, Slough ?Level: Skin/Subcutaneous Tissue ?Debridement Description: Excisional ?Instrument: Curette ?Bleeding: Minimum ?Hemostasis Achieved: Pressure ?Procedural Pain: 0 ?Post Procedural Pain: 0 ?Response to Treatment: Procedure was tolerated well ?Level of Consciousness (Post- Awake and Alert ?procedure): ?Post Debridement Measurements of Total Wound ?Length: (cm) 0.8 ?Width: (cm)  1.5 ?Depth: (cm) 0.1 ?Volume: (cm?) 0.094 ?Character of Wound/Ulcer Post Debridement: Requires Further Debridement ?Severity of Tissue Post Debridement: Fat layer exposed ?Post Procedure Diagnosis ?Same as Pre-procedure ?Electronic Signature(s) ?Signed: 11/28/2021 2:42:57 PM By: Fredirick Maudlin MD FACS ?Signed: 11/28/2021 6:08:52 PM By: Baruch Gouty RN, BSN ?Entered By: Baruch Gouty on 11/28/2021 10:59:24 ?-------------------------------------------------------------------------------- ?Debridement Details ?Patient Name: ?Date of Service: ?Edward [name] MES L. 11/28/2021 9:00 A M ?Medical Record Number: [id number] ?Patient Account Number: [id number] ?Date of Birth/Sex: ?Treating RN: ?February 12, 1971 (51 y.o.  Edward [name] ?Primary Care Provider: [name] ?Other Clinician: ?Referring Provider: ?Treating Provider/Extender: Fredirick Maudlin ?[name] K ?Weeks in Treatment: 0 ?Debridement Performed for Assessment: Wound #6 Left,Medial Lower Leg ?Performed By: Physician Fredirick Maudlin, MD ?Debridement Type: Debridement ?Severity of Tissue Pre Debridement: Fat layer exposed ?Level of Consciousness (Pre-procedure): Awake and Alert ?Pre-procedure Verification/Time Out Yes - 10:50 ?Taken: ?Start Time: 10:52 ?Pain Control: ?Other : benzocaine 20% spray ?T Area Debrided (L x W): ?otal 3 (cm) x 1.8 (cm) = 5.4 (cm?) ?Tissue and other material debrided: Viable, Non-Viable, Slough, Subcutaneous, Slough ?Level: Skin/Subcutaneous Tissue ?Debridement Description: Excisional ?Instrument: Curette ?Bleeding: Minimum ?Hemostasis Achieved: Pressure ?Procedural Pain: 0 ?Post Procedural Pain: 0 ?Response to Treatment: Procedure was tolerated well ?Level of Consciousness (Post- Awake and Alert ?procedure): ?Post Debridement Measurements of Total Wound ?Length: (cm) 3 ?Width: (cm) 1.8 ?Depth: (cm) 0.1 ?Volume: (cm?) 0.424 ?Character of Wound/Ulcer Post Debridement: Improved ?Severity of Tissue Post Debridement: Fat layer  exposed ?Post Procedure Diagnosis ?Same as Pre-procedure ?Electronic Signature(s) ?Signed: 11/28/2021 2:42:57 PM By: Fredirick Maudlin MD FACS ?Signed: 11/28/2021 6:08:52 PM By: Baruch Gouty RN, BSN ?Entered By: Baruch Gouty on 11/28/2021 11:00:22 ?-------------------------------------------------------------------------------- ?Debridement Details ?Patient Name: ?Date of Service: ?Edward [name] MES L. 11/28/2021 9:00 A M ?Medical Record Number: [id number] ?Patient Account Number: [id number] ?Date of Birth/Sex: ?Treating RN: ?1971/01/21 (51 y.o. Edward [name] ?Primary Care Provider: [name] ?Other Clinician: ?Referring Provider: ?Treating Provider/Extender: Fredirick Maudlin ?[name] K ?Weeks in Treatment: 0 ?Debridement Performed for Assessment: Wound #9 Right,Anterior Amputation Site - Below Knee ?Performed By: Physician Fredirick Maudlin, MD ?Debridement Type: Debridement ?Severity of Tissue Pre Debridement: Fat layer exposed ?Level of Consciousness (Pre-procedure): Awake and Alert ?Pre-procedure Verification/Time Out Yes - 10:50 ?Taken: ?Start Time: 10:52 ?Pain Control: ?Other : benzocaine 20% spray ?T Area Debrided (L x W): ?otal 2.5 (cm) x 3.2 (cm) = 8 (cm?) ?Tissue and other material debrided: Viable, Non-Viable, Slough, Subcutaneous, Slough ?Level: Skin/Subcutaneous Tissue ?Debridement Description: Excisional ?Instrument: Curette ?Bleeding: Minimum ?Hemostasis Achieved: Pressure ?Procedural Pain: 0 ?Post Procedural Pain: 0 ?Response to Treatment: Procedure was tolerated well ?Level of Consciousness (Post- Awake and Alert ?procedure): ?Post Debridement Measurements of Total Wound ?Length: (cm) 2.5 ?Width: (cm) 3.2 ?Depth: (cm) 0.1 ?Volume: (cm?) 0.628 ?Character of Wound/Ulcer Post Debridement: Requires Further Debridement ?Severity of Tissue Post Debridement: Fat layer exposed ?Post Procedure Diagnosis ?Same as Pre-procedure ?Electronic Signature(s) ?Signed: 11/28/2021 2:42:57 PM By: Fredirick Maudlin MD FACS ?Signed: 11/28/2021 6:08:52 PM By: Baruch Gouty RN, BSN ?Entered By: Baruch Gouty on 11/28/2021 11:01:44 ?-------------------------------------------------------------------------------- ?[place]

## 2021-11-29 ENCOUNTER — Ambulatory Visit (INDEPENDENT_AMBULATORY_CARE_PROVIDER_SITE_OTHER): Payer: Medicare Other | Admitting: Orthopedic Surgery

## 2021-11-29 DIAGNOSIS — Z89511 Acquired absence of right leg below knee: Secondary | ICD-10-CM

## 2021-11-29 DIAGNOSIS — I87332 Chronic venous hypertension (idiopathic) with ulcer and inflammation of left lower extremity: Secondary | ICD-10-CM | POA: Diagnosis not present

## 2021-11-29 DIAGNOSIS — S88111A Complete traumatic amputation at level between knee and ankle, right lower leg, initial encounter: Secondary | ICD-10-CM

## 2021-12-03 LAB — CULTURE, BLOOD (SINGLE): Culture: NO GROWTH

## 2021-12-05 ENCOUNTER — Encounter (HOSPITAL_BASED_OUTPATIENT_CLINIC_OR_DEPARTMENT_OTHER): Payer: Medicare Other | Admitting: General Surgery

## 2021-12-05 DIAGNOSIS — L97822 Non-pressure chronic ulcer of other part of left lower leg with fat layer exposed: Secondary | ICD-10-CM | POA: Diagnosis not present

## 2021-12-05 NOTE — Progress Notes (Signed)
Edward [name] ([id number]) ?Visit Report for 12/05/2021 ?Arrival Information Details ?Patient Name: Date of Service: ?Edward [name] MES L. 12/05/2021 12:30 PM ?Medical Record Number: [id number] ?Patient Account Number: [id number] ?Date of Birth/Sex: Treating RN: ?April 28, 1971 (51 y.o. Edward [name] ?Primary Care Asif Muchow: [name] Other Clinician: ?Referring Coltin Casher: ?Treating Eldwin Volkov/Extender: Fredirick Maudlin ?[name] K ?Weeks in Treatment: 1 ?Visit Information History Since Last Visit ?Added or deleted any medications: No ?Patient Arrived: Wheel Chair ?Any new allergies or adverse reactions: No ?Arrival Time: 12:34 ?Had a fall or experienced change in No ?Accompanied By: son ?activities of daily living that may affect ?Transfer Assistance: None ?risk of falls: ?Patient Identification Verified: Yes ?Signs or symptoms of abuse/neglect since last visito No ?Secondary Verification Process Completed: Yes ?Hospitalized since last visit: No ?Patient Requires Transmission-Based Precautions: No ?Implantable device outside of the clinic excluding No ?Patient Has Alerts: Yes ?cellular tissue based products placed in the center ?Patient Alerts: Patient on Blood Thinner since last visit: ?Has Dressing in Place as Prescribed: Yes ?Has Compression in Place as Prescribed: Yes ?Pain Present Now: Yes ?Electronic Signature(s) ?Signed: 12/05/2021 4:07:39 PM By: Adline Peals ?Entered By: Adline Peals on 12/05/2021 12:40:50 ?-------------------------------------------------------------------------------- ?Encounter Discharge Information Details ?Patient Name: Date of Service: ?Edward [name] MES L. 12/05/2021 12:30 PM ?Medical Record Number: [id number] ?Patient Account Number: [id number] ?Date of Birth/Sex: Treating RN: ?[birth year] (51 y.o. Edward [name] ?Primary Care Edward [name]: [name] Other Clinician: ?Referring Edward [name]: ?Treating Marshe Shrestha/Extender: Fredirick Maudlin ?[name] K ?Weeks in Treatment:  1 ?Encounter Discharge Information Items Post Procedure Vitals ?Discharge Condition: Stable ?Temperature (F): 98.4 ?Ambulatory Status: Wheelchair ?Pulse (bpm): 86 ?Discharge Destination: Home ?Respiratory Rate (breaths/min): 18 ?Transportation: Private Auto ?Blood Pressure (mmHg): 131/73 ?Accompanied By: son ?Schedule Follow-up Appointment: Yes ?Clinical Summary of Care: Patient Declined ?Electronic Signature(s) ?Signed: 12/05/2021 4:07:39 PM By: Adline Peals ?Entered By: Adline Peals on 12/05/2021 13:53:38 ?-------------------------------------------------------------------------------- ?Lower Extremity Assessment Details ?Patient Name: ?Date of Service: ?Edward [name] MES L. 12/05/2021 12:30 PM ?Medical Record Number: [id number] ?Patient Account Number: [id number] ?Date of Birth/Sex: ?Treating RN: ?02/08/1971 (51 y.o. Edward [name] ?Primary Care Zhara Gieske: [name] ?Other Clinician: ?Referring Patsie Mccardle: ?Treating Edward [name]/Extender: Fredirick Maudlin ?[name] K ?Weeks in Treatment: 1 ?Edema Assessment ?Assessed: [Left: No] [Right: No] ?Edema: [Left: Ye] [Right: s] ?Calf ?Left: Right: ?Point of Measurement: From Medial Instep 39 cm ?Ankle ?Left: Right: ?Point of Measurement: From Medial Instep 25.4 cm ?Vascular Assessment ?Pulses: ?Dorsalis Pedis ?Palpable: [Left:No] ?Electronic Signature(s) ?Signed: 12/05/2021 4:07:39 PM By: Adline Peals ?Signed: 12/05/2021 5:06:30 PM By: Baruch Gouty RN, BSN ?Entered By: Adline Peals on 12/05/2021 12:49:48 ?-------------------------------------------------------------------------------- ?Multi Wound Chart Details ?Patient Name: ?Date of Service: ?Edward [name] MES L. 12/05/2021 12:30 PM ?Medical Record Number: [id number] ?Patient Account Number: [id number] ?Date of Birth/Sex: ?Treating RN: ?1971/02/24 (51 y.o. Edward [name] ?Primary Care Edward [name]: [name] ?Other Clinician: ?Referring Jonluke Cobbins: ?Treating Emrie Gayle/Extender: Fredirick Maudlin ?[name] K ?Weeks in Treatment: 1 ?Vital Signs ?Height(in): 72 ?Pulse(bpm): 86 ?Weight(lbs): 230 ?Blood Pressure(mmHg): 131/73 ?Body Mass Index(BMI): 31.2 ?Temperature(??F): 98.4 ?Respiratory Rate(breaths/min): 18 ?Photos: [10:No Photos Right, Lateral Lower Leg] [4:No Photos Left, Anterior Lower Leg] [5:No Photos Left, Distal, Anterior Lower Leg] ?Wound Location: [10:Blister] [4:Blister] [5:Blister] ?Wounding Event: [10:Arterial Insufficiency Ulcer] [4:Diabetic Wound/Ulcer of the Lower] [5:Diabetic Wound/Ulcer of the Lower] ?Primary Etiology: [10:Cataracts, Anemia, Sleep Apnea,] [4:Extremity Cataracts, Anemia, Sleep Apnea,] [5:Extremity Cataracts, Anemia, Sleep Apnea,] ?Comorbid History: [10:Arrhythmia, Congestive Heart Failure, Arrhythmia, Congestive Heart Failure, Coronary Artery Disease, Hypertension, Myocardial  Infarction, Hypertension, Myocardial Infarction, Peripheral Venous Disease, Type II Peripheral Venous  ?Disease, Type II Diabetes, End Stage Renal Disease, Diabetes, End Stage Renal Disease, Osteoarthritis, Osteomyelitis, Neuropathy, Confinement Anxiety 12/05/2021] [4:Coronary Artery Disease, Osteoarthritis, Osteomyelitis, Neuropathy, Confinement Anxiety  ?09/19/2021] [5:Arrhythmia, Congestive Heart Failure, Coronary Artery Disease, Hypertension, Myocardial Infarction, Peripheral Venous Disease, Type II Diabetes, End Stage Renal Disease, Osteoarthritis, Osteomyelitis, Neuropathy, Confinement Anxiety  ?09/19/2021] ?Date Acquired: [10:0] [4:1] [5:1] ?Weeks of Treatment: [10:Open] [4:Open] [5:Open] ?Wound Status: [10:No] [4:No] [5:No] ?Wound Recurrence: [10:3.2x4x0.1] [4:1.1x1.5x0.1] [5:1.1x2.4x0.1] ?Measurements L x W x D (cm) [10:10.053] [4:1.296] [5:2.073] ?A (cm?) : ?rea [10:1.005] [4:0.13] [5:0.207] ?Volume (cm?) : [10:N/A] [4:-37.60%] [5:8.40%] ?% Reduction in A [10:rea: N/A] [4:-38.30%] [5:8.40%] ?% Reduction in Volume: [10:Full Thickness Without Exposed] [4:Grade 1] [5:Grade  1] ?Classification: [10:Support Structures Medium] [4:Medium] [5:Medium] ?Exudate A mount: [10:Serosanguineous] [4:Serous] [5:Serous] ?Exudate Type: [10:red, brown] [4:amber] [5:amber] ?Exudate Color: [10:N/A] [4:Flat and Intact] [5:Flat and Intact] ?Wound Margin: [10:None Present (0%)] [4:Medium (34-66%)] [5:None Present (0%)] ?Granulation A mount: [10:N/A] [4:Pink] [5:N/A] ?Granulation Quality: [10:Large (67-100%)] [4:Medium (34-66%)] [5:Large (67-100%)] ?Necrotic A mount: [10:Eschar, Adherent Slough] [4:Adherent Slough] [5:Adherent Slough] ?Necrotic Tissue: ?[10:Fat Layer (Subcutaneous Tissue): Yes Fat Layer (Subcutaneous Tissue): Yes Fat Layer (Subcutaneous Tissue): Yes] ?Exposed Structures: ?[10:Fascia: No Tendon: No Muscle: No Joint: No Bone: No Small (1-33%)] [4:Fascia: No Tendon: No Muscle: No Joint: No Bone: No Small (1-33%)] [5:Fascia: No Tendon: No Muscle: No Joint: No Bone: No Small (1-33%)] ?Epithelialization: [10:Debridement - Excisional] [4:Debridement - Excisional] [5:Debridement - Excisional] ?Debridement: ?Pre-procedure Verification/Time Out 13:18 [4:13:18] [5:13:18] ?Taken: [10:Other] [4:Other] [5:Other] ?Pain Control: [10:Necrotic/Eschar, Subcutaneous,] [4:Subcutaneous, Slough] [5:Subcutaneous, Slough] ?Tissue Debrided: [10:Slough Skin/Subcutaneous Tissue] [4:Skin/Subcutaneous Tissue] [5:Skin/Subcutaneous Tissue] ?Level: [10:12.8] [4:1.65] [5:2.64] ?Debridement A (sq cm): [10:rea Curette] [4:Curette] [5:Curette] ?Instrument: [10:Minimum] [4:Minimum] [5:Minimum] ?Bleeding: [10:Pressure] [4:Pressure] [5:Pressure] ?Hemostasis Achieved: [10:7] [4:0] [5:0] ?Procedural Pain: [10:4] [4:0] [5:0] ?Post Procedural Pain: ?Debridement Treatment Response: Procedure was tolerated well [4:Procedure was tolerated well] [5:Procedure was tolerated well] ?Post Debridement Measurements L x 3.2x4x0.1 [4:1.1x1.5x0.1] [5:1.1x2.4x0.1] ?W x D (cm) [10:1.005] [4:0.13] [5:0.207] ?Post Debridement Volume: (cm?)  [10:Debridement] [4:Debridement] [5:Debridement] ?Wound Number: '6 7 8 '$ ?Photos: No Photos No Photos No Photos ?Left, Medial Lower Leg Right, Medial Knee Right, Posterior Amputation Site - ?Wound Location: ?Below Knee ?Blister Blister Blister ?Wo

## 2021-12-05 NOTE — Progress Notes (Signed)
KIDUS, DELMAN ([id number]) ?Visit Report for 12/05/2021 ?Chief Complaint Document Details ?Patient Name: Date of Service: ?[name] MES L. 12/05/2021 12:30 PM ?Medical Record Number: [id number] ?Patient Account Number: [id number] ?Date of Birth/Sex: Treating RN: ?1971-05-28 (51 y.o. [name] ?Primary Care Provider: [name] Other Clinician: ?Referring Provider: ?Treating Provider/Extender: Fredirick Maudlin ?[name] K ?Weeks in Treatment: 1 ?Information Obtained from: Patient ?Chief Complaint ?Patients presents for treatment of an open diabetic ulcer ?Electronic Signature(s) ?Signed: 12/05/2021 1:47:42 PM By: Fredirick Maudlin MD FACS ?Entered By: Fredirick Maudlin on 12/05/2021 13:47:42 ?-------------------------------------------------------------------------------- ?Debridement Details ?Patient Name: Date of Service: ?[name] MES L. 12/05/2021 12:30 PM ?Medical Record Number: [id number] ?Patient Account Number: [id number] ?Date of Birth/Sex: Treating RN: ?[birth year] (51 y.o. [name] ?Primary Care Provider: [name] Other Clinician: ?Referring Provider: ?Treating Provider/Extender: Fredirick Maudlin ?[name] K ?Weeks in Treatment: 1 ?Debridement Performed for Assessment: Wound #5 Left,Distal,Anterior Lower Leg ?Performed By: Physician Fredirick Maudlin, MD ?Debridement Type: Debridement ?Severity of Tissue Pre Debridement: Fat layer exposed ?Level of Consciousness (Pre-procedure): Awake and Alert ?Pre-procedure Verification/Time Out Yes - 13:18 ?Taken: ?Start Time: 13:18 ?Pain Control: ?Other : benzocaine ?T Area Debrided (L x W): ?otal 1.1 (cm) x 2.4 (cm) = 2.64 (cm?) ?Tissue and other material debrided: Viable, Non-Viable, Slough, Subcutaneous, Slough ?Level: Skin/Subcutaneous Tissue ?Debridement Description: Excisional ?Instrument: Curette ?Bleeding: Minimum ?Hemostasis Achieved: Pressure ?Procedural Pain: 0 ?Post Procedural Pain: 0 ?Response to Treatment: Procedure was  tolerated well ?Level of Consciousness (Post- Awake and Alert ?procedure): ?Post Debridement Measurements of Total Wound ?Length: (cm) 1.1 ?Width: (cm) 2.4 ?Depth: (cm) 0.1 ?Volume: (cm?) 0.207 ?Character of Wound/Ulcer Post Debridement: Improved ?Severity of Tissue Post Debridement: Fat layer exposed ?Post Procedure Diagnosis ?Same as Pre-procedure ?Electronic Signature(s) ?Signed: 12/05/2021 4:07:39 PM By: Adline Peals ?Signed: 12/05/2021 4:46:17 PM By: Fredirick Maudlin MD FACS ?Signed: 12/05/2021 5:06:30 PM By: Baruch Gouty RN, BSN ?Entered By: Adline Peals on 12/05/2021 13:21:17 ?-------------------------------------------------------------------------------- ?Debridement Details ?Patient Name: ?Date of Service: ?[name] MES L. 12/05/2021 12:30 PM ?Medical Record Number: [id number] ?Patient Account Number: [id number] ?Date of Birth/Sex: ?Treating RN: ?1970-09-08 (51 y.o. [name] ?Primary Care Provider: [name] ?Other Clinician: ?Referring Provider: ?Treating Provider/Extender: Fredirick Maudlin ?[name] K ?Weeks in Treatment: 1 ?Debridement Performed for Assessment: Wound #6 Left,Medial Lower Leg ?Performed By: Physician Fredirick Maudlin, MD ?Debridement Type: Debridement ?Severity of Tissue Pre Debridement: Fat layer exposed ?Level of Consciousness (Pre-procedure): Awake and Alert ?Pre-procedure Verification/Time Out Yes - 13:18 ?Taken: ?Start Time: 13:18 ?Pain Control: ?Other : benzocaine ?T Area Debrided (L x W): ?otal 2.6 (cm) x 1.5 (cm) = 3.9 (cm?) ?Tissue and other material debrided: Viable, Non-Viable, Slough, Subcutaneous, Slough ?Level: Skin/Subcutaneous Tissue ?Debridement Description: Excisional ?Instrument: Curette ?Bleeding: Minimum ?Hemostasis Achieved: Pressure ?Procedural Pain: 0 ?Post Procedural Pain: 0 ?Response to Treatment: Procedure was tolerated well ?Level of Consciousness (Post- Awake and Alert ?procedure): ?Post Debridement Measurements of Total  Wound ?Length: (cm) 2.6 ?Width: (cm) 1.5 ?Depth: (cm) 0.1 ?Volume: (cm?) 0.306 ?Character of Wound/Ulcer Post Debridement: Improved ?Severity of Tissue Post Debridement: Fat layer exposed ?Post Procedure Diagnosis ?Same as Pre-procedure ?Electronic Signature(s) ?Signed: 12/05/2021 4:07:39 PM By: Adline Peals ?Signed: 12/05/2021 4:46:17 PM By: Fredirick Maudlin MD FACS ?Signed: 12/05/2021 5:06:30 PM By: Baruch Gouty RN, BSN ?Entered By: Adline Peals on 12/05/2021 13:22:00 ?-------------------------------------------------------------------------------- ?Debridement Details ?Patient Name: ?Date of Service: ?[name] MES L. 12/05/2021 12:30 PM ?Medical Record Number: [id number] ?Patient Account Number: [id number] ?Date of Birth/Sex: ?Treating  RN: ?1971/02/01 (51 y.o. [name] ?Primary Care Provider: [name] ?Other Clinician: ?Referring Provider: ?Treating Provider/Extender: Fredirick Maudlin ?[name] K ?Weeks in Treatment: 1 ?Debridement Performed for Assessment: Wound #4 Left,Anterior Lower Leg ?Performed By: Physician Fredirick Maudlin, MD ?Debridement Type: Debridement ?Severity of Tissue Pre Debridement: Fat layer exposed ?Level of Consciousness (Pre-procedure): Awake and Alert ?Pre-procedure Verification/Time Out Yes - 13:18 ?Taken: ?Start Time: 13:18 ?Pain Control: ?Other : benzocaine ?T Area Debrided (L x W): ?otal 1.1 (cm) x 1.5 (cm) = 1.65 (cm?) ?Tissue and other material debrided: Viable, Non-Viable, Slough, Subcutaneous, Slough ?Level: Skin/Subcutaneous Tissue ?Debridement Description: Excisional ?Instrument: Curette ?Bleeding: Minimum ?Hemostasis Achieved: Pressure ?Procedural Pain: 0 ?Post Procedural Pain: 0 ?Response to Treatment: Procedure was tolerated well ?Level of Consciousness (Post- Awake and Alert ?procedure): ?Post Debridement Measurements of Total Wound ?Length: (cm) 1.1 ?Width: (cm) 1.5 ?Depth: (cm) 0.1 ?Volume: (cm?) 0.13 ?Character of Wound/Ulcer Post  Debridement: Improved ?Severity of Tissue Post Debridement: Fat layer exposed ?Post Procedure Diagnosis ?Same as Pre-procedure ?Electronic Signature(s) ?Signed: 12/05/2021 4:07:39 PM By: Adline Peals ?Signed: 12/05/2021 4:46:17 PM By: Fredirick Maudlin MD FACS ?Signed: 12/05/2021 5:06:30 PM By: Baruch Gouty RN, BSN ?Entered By: Adline Peals on 12/05/2021 13:22:25 ?-------------------------------------------------------------------------------- ?Debridement Details ?Patient Name: ?Date of Service: ?[name] MES L. 12/05/2021 12:30 PM ?Medical Record Number: [id number] ?Patient Account Number: [id number] ?Date of Birth/Sex: ?Treating RN: ?Jun 24, 1971 (51 y.o. [name] ?Primary Care Provider: [name] ?Other Clinician: ?Referring Provider: ?Treating Provider/Extender: Fredirick Maudlin ?[name] K ?Weeks in Treatment: 1 ?Debridement Performed for Assessment: Wound #7 Right,Medial Knee ?Performed By: Physician Fredirick Maudlin, MD ?Debridement Type: Debridement ?Severity of Tissue Pre Debridement: Fat layer exposed ?Level of Consciousness (Pre-procedure): Awake and Alert ?Pre-procedure Verification/Time Out Yes - 13:18 ?Taken: ?Start Time: 13:18 ?Pain Control: ?Other : benzocaine ?T Area Debrided (L x W): ?otal 5 (cm) x 5 (cm) = 25 (cm?) ?Tissue and other material debrided: ?Non-Viable, Eschar, Skin: Epidermis ?Level: Skin/Epidermis ?Debridement Description: Selective/Open Wound ?Instrument: Curette ?Bleeding: Minimum ?Hemostasis Achieved: Pressure ?Procedural Pain: 7 ?Post Procedural Pain: 4 ?Response to Treatment: Procedure was tolerated well ?Level of Consciousness (Post- Awake and Alert ?procedure): ?Post Debridement Measurements of Total Wound ?Length: (cm) 12.2 ?Width: (cm) 9 ?Depth: (cm) 0.1 ?Volume: (cm?) 8.624 ?Character of Wound/Ulcer Post Debridement: Improved ?Severity of Tissue Post Debridement: Fat layer exposed ?Post Procedure Diagnosis ?Same as Pre-procedure ?Electronic  Signature(s) ?Signed: 12/05/2021 4:07:39 PM By: Adline Peals ?Signed: 12/05/2021 4:46:17 PM By: Fredirick Maudlin MD FACS ?Signed: 12/05/2021 5:06:30 PM By: Baruch Gouty RN, BSN ?Entered By: Adline Peals on 12/05/2021

## 2021-12-09 ENCOUNTER — Encounter: Payer: Self-pay | Admitting: Orthopedic Surgery

## 2021-12-09 NOTE — Progress Notes (Signed)
? ?Office Visit Note ?  ?Patient: Edward [name]           ?Date of Birth: [birth year]           ?MRN: [id number] ?Visit Date: 11/29/2021 ?             ?Requested by: [name], MD ?[address] ?[place],  [place] [address] ?PCP: [name], MD ? ?Chief Complaint  ?Patient presents with  ? Right Leg - Wound Check  ? ? ? ? ?HPI: ?Patient is a 51 year old male who presents complaining of right residual limb pain.  He has an open wound he is status post a CT scan on April 12.  Patient is undergoing wound care for wrapping the left leg for chronic ulcers. ? ?Assessment & Plan: ?Visit Diagnoses: No diagnosis found. ? ?Plan: Patient was provided a prescription for biotech for new prosthesis.  Patient also underwent a mobility assessment for a wheelchair.  He will bring the form he will need a new wheelchair. ? ?Follow-Up Instructions: Return if symptoms worsen or fail to improve.  ? ?Ortho Exam ? ?Patient is alert, oriented, no adenopathy, well-dressed, normal affect, normal respiratory effort. ?Examination there is a large superficial blister over the medial distal femur and lateral tibia secondary to subsiding in his socket.  There is an ulcer distally on the transtibial amputation. ? ?Imaging: ?No results found. ?No images are attached to the encounter. ? ?Labs: ?Lab Results  ?Component Value Date  ? HGBA1C 8.6 (H) 10/05/2021  ? HGBA1C 10.4 (H) 09/27/2020  ? HGBA1C 10.4 (H) 09/27/2020  ? ESRSEDRATE 128 (H) 06/15/2018  ? ESRSEDRATE 47 (H) 05/07/2016  ? CRP 215.2 (H) 06/15/2018  ? CRP 22.6 (H) 05/07/2016  ? REPTSTATUS 12/03/2021 FINAL 11/28/2021  ? GRAMSTAIN  10/05/2021  ?  RARE WBC PRESENT,BOTH PMN AND MONONUCLEAR ?RARE GRAM POSITIVE COCCI IN PAIRS ?RARE GRAM NEGATIVE RODS ?  ? CULT  11/28/2021  ?  NO GROWTH 5 DAYS ?Performed at [facility], [place] [address] [place],  [address] ?  ? [place] PNEUMONIAE 10/05/2021  ? LABORGA CITROBACTER BRAAKII 10/05/2021  ? ? ? ?Lab Results  ?Component Value  Date  ? ALBUMIN 2.7 (L) 10/06/2021  ? ALBUMIN 2.9 (L) 09/06/2021  ? ALBUMIN 3.8 08/29/2021  ? ? ?Lab Results  ?Component Value Date  ? MG 2.2 10/05/2021  ? MG 2.5 (H) 10/05/2021  ? MG 2.6 (H) 09/06/2021  ? ?No results found for: VD25OH ? ?No results found for: PREALBUMIN ? ?  Latest Ref Rng & Units 11/28/2021  ?  7:06 PM 11/15/2021  ?  3:31 PM 10/06/2021  ?  7:02 AM  ?CBC EXTENDED  ?WBC 4.0 - 10.5 K/uL 6.4    8.4    ?RBC 4.22 - 5.81 MIL/uL 3.03    3.16    ?Hemoglobin 13.0 - 17.0 g/dL 10.1   10.2   10.0    ?HCT 39.0 - 52.0 % 31.6   30.0   30.5    ?Platelets 150 - 400 K/uL 208    182    ?NEUT# 1.7 - 7.7 K/uL 4.9      ?Lymph# 0.7 - 4.0 K/uL 0.7      ? ? ? ?There is no height or weight on file to calculate BMI. ? ?Orders:  ?No orders of the defined types were placed in this encounter. ? ?No orders of the defined types were placed in this encounter. ? ? ? Procedures: ?No  procedures performed ? ?Clinical Data: ?No additional findings. ? ?ROS: ? ?All other systems negative, except as noted in the HPI. ?Review of Systems ? ?Objective: ?Vital Signs: There were no vitals taken for this visit. ? ?Specialty Comments:  ?No specialty comments available. ? ?PMFS History: ?Patient Active Problem List  ? Diagnosis Date Noted  ? Osteomyelitis of finger of left hand ([place]) 10/04/2021  ? Junctional rhythm 10/04/2021  ? Acute cholecystitis 09/27/2020  ? Elevated LFTs 09/27/2020  ? TIA (transient ischemic attack) 09/27/2020  ? Nausea and vomiting   ? ESRD on hemodialysis ([place]) 06/22/2020  ? S/P coronary artery stent placement 06/22/2020  ? Encounter for removal of sutures 03/07/2020  ? Fluid overload, unspecified 12/29/2019  ? Hypertensive chronic kidney disease with stage 1 through stage 4 chronic kidney disease, or unspecified chronic kidney disease 11/30/2019  ? Pruritus, unspecified 11/30/2019  ? Secondary hyperparathyroidism of renal origin ([place]) 11/30/2019  ? Unspecified atrial fibrillation ([place]) 11/30/2019  ? Pain, unspecified  11/30/2019  ? Other specified arthritis, unspecified site 11/30/2019  ? Iron deficiency anemia, unspecified 11/30/2019  ? End stage renal disease ([place]) 11/30/2019  ? Encounter for screening for respiratory tuberculosis 11/30/2019  ? Diarrhea, unspecified 11/30/2019  ? Coagulation defect, unspecified ([place]) 11/30/2019  ? Atherosclerotic heart disease of native coronary artery without angina pectoris 11/30/2019  ? Acute renal failure (ARF) ([place]) 11/22/2019  ? Transaminitis 12/22/2018  ? Acute exacerbation of CHF (congestive heart failure) ([place]) 12/21/2018  ? Gait abnormality 09/11/2018  ? CAD S/P percutaneous coronary angioplasty 08/06/2018  ? Cardiomyopathy ([place]) 08/06/2018  ? PVD (peripheral vascular disease) ([place]) 08/06/2018  ? Shortness of breath   ? Chronic combined systolic and diastolic CHF (congestive heart failure) ([place])   ? History of NSTEMI 07/28/2018  ? Respiratory failure with hypoxia ([place]) 07/28/2018  ? Acute heart failure ([place]) 07/28/2018  ? Hypertensive emergency 07/28/2018  ? S/P BKA (below knee amputation) unilateral, right ([place]) 07/15/2018  ? Hypoglycemia   ? Anxiety about health   ? Labile blood glucose   ? Essential hypertension   ? History of transmetatarsal amputation of left foot ([place])   ? Hypoalbuminemia due to protein-calorie malnutrition ([place])   ? Poorly controlled type 2 diabetes mellitus with peripheral neuropathy ([place])   ? Acute blood loss anemia   ? Anemia of chronic disease   ? Leukocytosis   ? Tobacco abuse   ? Hyperkalemia 06/20/2018  ? Diabetic polyneuropathy associated with type 2 diabetes mellitus ([place])   ? Severe protein-calorie malnutrition ([place])   ? AKI (acute kidney injury) ([place]) 06/17/2018  ? Normocytic anemia 06/17/2018  ? Viral pharyngitis 01/14/2017  ? Cigarette smoker 01/14/2017  ? Avascular necrosis of hip, left ([place]) 10/11/2016  ? Status post left hip replacement 10/11/2016  ? S/P transmetatarsal amputation of foot, left ([place]) 07/25/2016  ? Balance problem 07/25/2016  ?  Fournier's gangrene 08/16/2011  ? Acute respiratory failure ([place]) 08/16/2011  ? Septic shock(785.52) 08/16/2011  ? Diabetes mellitus with complication, with long-term current use of insulin ([place]) 08/16/2011  ? ?Past Medical History:  ?Diagnosis Date  ? Allergy   ? Anemia   ? getting Venofer with dialysis  ? Anxiety   ? self reported, sometimes-situational anxiety  ? Arthritis   ? bilateral hands/LEFT hip  ? CHF (congestive heart failure) ([place])   ? Chronic kidney disease   ? t th s dialysis- [place] ---ESRD  ? Complication of anesthesia   ? slow to go to sleep  ? Coronary artery  disease   ? Depression   ? self reported, sometimes-situational depression  ? Diabetes mellitus   ? type II- on meds  ? Dyspnea   ? sometimes- fluid  ? GERD (gastroesophageal reflux disease)   ? on meds  ? Hypertension   ? no longer on medication since starting on Hemodialysis  ? Myocardial infarction [facility]) 2019  ? Sleep apnea   ?  ?Family History  ?Problem Relation Age of Onset  ? Diabetes Other   ? Pancreatic cancer Mother 86  ? Colon cancer Neg Hx   ? Colon polyps Neg Hx   ? Esophageal cancer Neg Hx   ? Rectal cancer Neg Hx   ? Stomach cancer Neg Hx   ?  ?Past Surgical History:  ?Procedure Laterality Date  ? A/V FISTULAGRAM Left 03/06/2020  ? Procedure: A/V FISTULAGRAM;  Surgeon: Waynetta Sandy, MD;  Location: [facility];  Service: Cardiovascular;  Laterality: Left;  ? ABDOMINAL AORTOGRAM W/LOWER EXTREMITY N/A 04/09/2021  ? Procedure: ABDOMINAL AORTOGRAM W/LOWER EXTREMITY;  Surgeon: Waynetta Sandy, MD;  Location: [facility];  Service: Cardiovascular;  Laterality: N/A;  ? AMPUTATION Left 05/10/2016  ? Procedure: Left Transmetatarsal Amputation;  Surgeon: [name], MD;  Location: [name];  Service: Orthopedics;  Laterality: Left;  ? AMPUTATION Right 05/16/2018  ? Procedure: PARTIAL RAY AMPUTATION FIFTH TOE RIGHT FOOT;  Surgeon: [name], DPM;  Location: [facility];  Service: Podiatry;  Laterality: Right;  ?  AMPUTATION Right 06/17/2018  ? Procedure: AMPUTATION 4th RAY Right Foot;  Surgeon: Trula Slade, DPM;  Location: [facility];  Service: Podiatry;  Laterality: Right;  ? AMPUTATION Right 06/19/2018  ? Procedur

## 2021-12-12 ENCOUNTER — Encounter (HOSPITAL_BASED_OUTPATIENT_CLINIC_OR_DEPARTMENT_OTHER): Payer: Medicare Other | Admitting: General Surgery

## 2021-12-12 ENCOUNTER — Telehealth: Payer: Self-pay | Admitting: Orthopedic Surgery

## 2021-12-12 DIAGNOSIS — L97822 Non-pressure chronic ulcer of other part of left lower leg with fat layer exposed: Secondary | ICD-10-CM | POA: Diagnosis not present

## 2021-12-12 NOTE — Telephone Encounter (Signed)
Patient called. Says [facility] sent a referral to Dr. Sharol Given for him to sign for a power wheelchair. Would like to know if it was received and signed. His call back number is [phone] ?

## 2021-12-14 NOTE — Telephone Encounter (Signed)
Can you please call pt? Yes we received the paperwork but her requires a face to face mobility exam per [facility] documentation request. Cna you make an appt for Edward [name] the week of the 10th? Or whatever works for the pt after that?  ?

## 2021-12-18 ENCOUNTER — Telehealth: Payer: Self-pay | Admitting: *Deleted

## 2021-12-18 NOTE — Telephone Encounter (Signed)
Patient states he needs help putting on his heart monitor.  ?

## 2021-12-19 ENCOUNTER — Encounter: Payer: Self-pay | Admitting: *Deleted

## 2021-12-19 ENCOUNTER — Other Ambulatory Visit: Payer: Self-pay | Admitting: *Deleted

## 2021-12-19 ENCOUNTER — Other Ambulatory Visit (INDEPENDENT_AMBULATORY_CARE_PROVIDER_SITE_OTHER): Payer: Medicare Other

## 2021-12-19 ENCOUNTER — Encounter (HOSPITAL_BASED_OUTPATIENT_CLINIC_OR_DEPARTMENT_OTHER): Payer: Medicare Other | Attending: General Surgery | Admitting: General Surgery

## 2021-12-19 DIAGNOSIS — I2510 Atherosclerotic heart disease of native coronary artery without angina pectoris: Secondary | ICD-10-CM | POA: Insufficient documentation

## 2021-12-19 DIAGNOSIS — L97822 Non-pressure chronic ulcer of other part of left lower leg with fat layer exposed: Secondary | ICD-10-CM | POA: Diagnosis present

## 2021-12-19 DIAGNOSIS — Z89511 Acquired absence of right leg below knee: Secondary | ICD-10-CM | POA: Diagnosis not present

## 2021-12-19 DIAGNOSIS — E8770 Fluid overload, unspecified: Secondary | ICD-10-CM | POA: Diagnosis not present

## 2021-12-19 DIAGNOSIS — I441 Atrioventricular block, second degree: Secondary | ICD-10-CM

## 2021-12-19 DIAGNOSIS — I5042 Chronic combined systolic (congestive) and diastolic (congestive) heart failure: Secondary | ICD-10-CM | POA: Diagnosis not present

## 2021-12-19 DIAGNOSIS — Z992 Dependence on renal dialysis: Secondary | ICD-10-CM | POA: Insufficient documentation

## 2021-12-19 DIAGNOSIS — Z89432 Acquired absence of left foot: Secondary | ICD-10-CM | POA: Diagnosis not present

## 2021-12-19 DIAGNOSIS — N186 End stage renal disease: Secondary | ICD-10-CM | POA: Diagnosis not present

## 2021-12-19 DIAGNOSIS — E1165 Type 2 diabetes mellitus with hyperglycemia: Secondary | ICD-10-CM | POA: Insufficient documentation

## 2021-12-19 DIAGNOSIS — L97112 Non-pressure chronic ulcer of right thigh with fat layer exposed: Secondary | ICD-10-CM | POA: Insufficient documentation

## 2021-12-19 DIAGNOSIS — I429 Cardiomyopathy, unspecified: Secondary | ICD-10-CM | POA: Insufficient documentation

## 2021-12-19 DIAGNOSIS — E1142 Type 2 diabetes mellitus with diabetic polyneuropathy: Secondary | ICD-10-CM | POA: Insufficient documentation

## 2021-12-19 DIAGNOSIS — E1122 Type 2 diabetes mellitus with diabetic chronic kidney disease: Secondary | ICD-10-CM | POA: Insufficient documentation

## 2021-12-19 NOTE — Progress Notes (Signed)
Edward [name] ([id number]) ?Visit Report for 12/19/2021 ?Arrival Information Details ?Patient Name: Date of Service: ?Edward Mayo MES L. 12/19/2021 1:15 PM ?Medical Record Number: [id number] ?Patient Account Number: [id number] ?Date of Birth/Sex: Treating RN: ?[birth year] (51 y.o. Edward [name] ?Primary Care Chidubem Chaires: [name] Other Clinician: ?Referring Fredderick Swanger: ?Treating Arlow Spiers/Extender: Fredirick Maudlin ?[name] K ?Weeks in Treatment: 3 ?Visit Information History Since Last Visit ?Added or deleted any medications: No ?Patient Arrived: Wheel Chair ?Any new allergies or adverse reactions: No ?Arrival Time: 13:19 ?Had a fall or experienced change in No ?Accompanied By: son ?activities of daily living that may affect ?Transfer Assistance: None ?risk of falls: ?Patient Identification Verified: Yes ?Signs or symptoms of abuse/neglect since last visito No ?Secondary Verification Process Completed: Yes ?Hospitalized since last visit: No ?Patient Requires Transmission-Based Precautions: No ?Implantable device outside of the clinic excluding No ?Patient Has Alerts: Yes ?cellular tissue based products placed in the center ?Patient Alerts: Patient on Blood Thinner since last visit: ?Has Dressing in Place as Prescribed: Yes ?Has Compression in Place as Prescribed: Yes ?Pain Present Now: Yes ?Electronic Signature(s) ?Signed: 12/19/2021 5:38:18 PM By: Baruch Gouty RN, BSN ?Entered By: Baruch Gouty on 12/19/2021 13:23:55 ?-------------------------------------------------------------------------------- ?Encounter Discharge Information Details ?Patient Name: Date of Service: ?Edward Mayo MES L. 12/19/2021 1:15 PM ?Medical Record Number: [id number] ?Patient Account Number: [id number] ?Date of Birth/Sex: Treating RN: ?[birth year] (51 y.o. Edward [name] ?Primary Care Kenn Rekowski: [name] Other Clinician: ?Referring Royce Sciara: ?Treating Stylianos Stradling/Extender: Fredirick Maudlin ?[name] K ?Weeks in Treatment:  3 ?Encounter Discharge Information Items Post Procedure Vitals ?Discharge Condition: Stable ?Temperature (F): 98.5 ?Ambulatory Status: Wheelchair ?Pulse (bpm): 87 ?Discharge Destination: Home ?Respiratory Rate (breaths/min): 18 ?Transportation: Private Auto ?Blood Pressure (mmHg): 132/78 ?Accompanied By: son ?Schedule Follow-up Appointment: Yes ?Clinical Summary of Care: Patient Declined ?Electronic Signature(s) ?Signed: 12/19/2021 5:38:18 PM By: Baruch Gouty RN, BSN ?Entered By: Baruch Gouty on 12/19/2021 14:36:34 ?-------------------------------------------------------------------------------- ?Lower Extremity Assessment Details ?Patient Name: ?Date of Service: ?Edward Mayo MES L. 12/19/2021 1:15 PM ?Medical Record Number: [id number] ?Patient Account Number: [id number] ?Date of Birth/Sex: ?Treating RN: ?[birth year] (51 y.o. Edward [name] ?Primary Care Elmin Wiederholt: [name] ?Other Clinician: ?Referring Rether Rison: ?Treating Zineb Glade/Extender: Fredirick Maudlin ?[name] K ?Weeks in Treatment: 3 ?Edema Assessment ?Assessed: [Left: No] [Right: No] ?Edema: [Left: Ye] [Right: s] ?Calf ?Left: Right: ?Point of Measurement: From Medial Instep 37.5 cm ?Ankle ?Left: Right: ?Point of Measurement: From Medial Instep 24 cm ?Vascular Assessment ?Pulses: ?Dorsalis Pedis ?Palpable: [Left:Yes] ?Electronic Signature(s) ?Signed: 12/19/2021 5:38:18 PM By: Baruch Gouty RN, BSN ?Entered By: Baruch Gouty on 12/19/2021 13:29:31 ?-------------------------------------------------------------------------------- ?Multi Wound Chart Details ?Patient Name: ?Date of Service: ?Edward Mayo MES L. 12/19/2021 1:15 PM ?Medical Record Number: [id number] ?Patient Account Number: [id number] ?Date of Birth/Sex: ?Treating RN: ?Dec 08, 1970 (51 y.o. Edward [name] ?Primary Care Dianca Owensby: [name] ?Other Clinician: ?Referring Kiwanna Spraker: ?Treating Socorro Ebron/Extender: Fredirick Maudlin ?[name] K ?Weeks in Treatment: 3 ?Vital  Signs ?Height(in): 72 ?Capillary Blood Glucose(mg/dl): 182 ?Weight(lbs): 230 ?Pulse(bpm): 87 ?Body Mass Index(BMI): 31.2 ?Blood Pressure(mmHg): 132/78 ?Temperature(??F): 98.5 ?Respiratory Rate(breaths/min): 18 ?Photos: [10:No Photos Right, Lateral Lower Leg] [4:No Photos Left, Anterior Lower Leg] [5:No Photos Left, Distal, Anterior Lower Leg] ?Wound Location: [10:Blister] [4:Blister] [5:Blister] ?Wounding Event: [10:Arterial Insufficiency Ulcer] [4:Diabetic Wound/Ulcer of the Lower] [5:Diabetic Wound/Ulcer of the Lower] ?Primary Etiology: [10:Cataracts, Anemia, Sleep Apnea,] [4:Extremity Cataracts, Anemia, Sleep Apnea,] [5:Extremity Cataracts, Anemia, Sleep Apnea,] ?Comorbid History: [10:Arrhythmia, Congestive Heart Failure, Arrhythmia, Congestive Heart Failure, Coronary Artery Disease, Hypertension,  Myocardial Infarction, Hypertension, Myocardial Infarction, Peripheral Venous Disease, Type II Peripheral Venous  ?Disease, Type II Diabetes, End Stage Renal Disease, Diabetes, End Stage Renal Disease, Osteoarthritis, Osteomyelitis, Neuropathy, Confinement Anxiety 12/05/2021] [4:Coronary Artery Disease, Osteoarthritis, Osteomyelitis, Neuropathy, Confinement Anxiety  ?09/19/2021] [5:Arrhythmia, Congestive Heart Failure, Coronary Artery Disease, Hypertension, Myocardial Infarction, Peripheral Venous Disease, Type II Diabetes, End Stage Renal Disease, Osteoarthritis, Osteomyelitis, Neuropathy, Confinement Anxiety  ?09/19/2021] ?Date Acquired: [10:2] [4:3] [5:3] ?Weeks of Treatment: [10:Open] [4:Open] [5:Open] ?Wound Status: [10:No] [4:No] [5:No] ?Wound Recurrence: [10:3.4x4x0.1] [4:0x0x0] [5:1.2x2.5x0.1] ?Measurements L x W x D (cm) [10:10.681] [4:0] [5:2.356] ?A (cm?) : ?rea [10:1.068] [4:0] [6:1.607] ?Volume (cm?) : [10:-6.20%] [4:100.00%] [5:-4.20%] ?% Reduction in Area: [10:-6.30%] [4:100.00%] [5:-4.40%] ?% Reduction in Volume: [10:Full Thickness Without Exposed] [4:Grade 1] [5:Grade 1] ?Classification: [10:Support  Structures Medium] [4:None Present] [5:Medium] ?Exudate A mount: [10:Serosanguineous] [4:N/A] [5:Serous] ?Exudate Type: [10:red, brown] [4:N/A] [5:amber] ?Exudate Color: [10:Flat and Intact] [4:N/A] [5:Flat and Intact] ?Wound Margin: [10:None Present (0%)] [4:None Present (0%)] [5:Medium (34-66%)] ?Granulation A mount: [10:N/A] [4:N/A] [5:Pink] ?Granulation Quality: [10:Large (67-100%)] [4:None Present (0%)] [5:Medium (34-66%)] ?Necrotic A mount: [10:Adherent Slough] [4:N/A] [5:Adherent Slough] ?Necrotic Tissue: ?[10:Fat Layer (Subcutaneous Tissue): Yes Fat Layer (Subcutaneous Tissue): Yes Fat Layer (Subcutaneous Tissue): Yes] ?Exposed Structures: ?[10:Fascia: No Tendon: No Muscle: No Joint: No Bone: No Small (1-33%)] [4:Fascia: No Tendon: No Muscle: No Joint: No Bone: No Large (67-100%)] [5:Fascia: No Tendon: No Muscle: No Joint: No Bone: No Small (1-33%)] ?Epithelialization: [10:Debridement - Excisional] [4:N/A] [5:Debridement - Excisional] ?Debridement: ?Pre-procedure Verification/Time Out 13:50 [4:N/A] [5:13:50] ?Taken: [10:Other] [4:N/A] [5:Other] ?Pain Control: [10:Subcutaneous, Slough] [4:N/A] [5:Subcutaneous, Slough] ?Tissue Debrided: [10:Skin/Subcutaneous Tissue] [4:N/A] [5:Skin/Subcutaneous Tissue] ?Level: [10:13.6] [4:N/A] [5:3] ?Debridement A (sq cm): [10:rea Curette] [4:N/A] [5:Curette] ?Instrument: [10:Minimum] [4:N/A] [5:Minimum] ?Bleeding: [10:Pressure] [4:N/A] [5:Pressure] ?Hemostasis A chieved: [10:0] [4:N/A] [5:0] ?Procedural Pain: [10:0] [4:N/A] [5:0] ?Post Procedural Pain: [10:Procedure was tolerated well] [4:N/A] [5:Procedure was tolerated well] ?Debridement Treatment Response: [10:3.4x4x0.1] [4:N/A] [5:1.2x2.5x0.1] ?Post Debridement Measurements L x ?W x D (cm) [10:1.068] [4:N/A] [5:0.236] ?Post Debridement Volume: (cm?) [10:Debridement] [4:N/A] [5:Debridement] ?Wound Number: '6 7 9 '$ ?Photos: No Photos No Photos No Photos ?Left, Medial Lower Leg Right, Medial Knee Right, Anterior Amputation Site  - Below ?Wound Location: ?Knee ?Blister Blister Blister ?Wounding Event: ?Diabetic Wound/Ulcer of the Lower Diabetic Wound/Ulcer of the Lower Diabetic Wound/Ulcer of the Lower ?Primary Etiology: ?Extremity Extremity Extremity ?Cata

## 2021-12-19 NOTE — Progress Notes (Signed)
Patient ID: Edward [name], male   DOB: [birth year], 51 y.o.   MRN: [id number] ?Patient had been enrolled 09/21/21 for [employer] to mail a monitor to his home.  [name] son was unable to successfully apply that monitor, status now lost.   [employer] sent a replacement monitor serial # [id number].  It is now status lost.   ?Patients insurance has now been changed. ?Original order cancelled and a replacement order entered. ?Patient scheduled to come into office to have serial # [id number] from office inventory applied on 12/19/2021. ?

## 2021-12-19 NOTE — Progress Notes (Unsigned)
[id number] zio xt from office inventory applied to patient. ?

## 2021-12-19 NOTE — Progress Notes (Signed)
MAHESH, SIZEMORE ([id number]) ?Visit Report for 12/19/2021 ?Chief Complaint Document Details ?Patient Name: Date of Service: ?Edward Mayo MES L. 12/19/2021 1:15 PM ?Medical Record Number: [id number] ?Patient Account Number: [id number] ?Date of Birth/Sex: Treating RN: ?25-Jan-1971 (51 y.o. [name] ?Primary Care Provider: [name] Other Clinician: ?Referring Provider: ?Treating Provider/Extender: Fredirick Maudlin ?Edward [name] ?Weeks in Treatment: 3 ?Information Obtained from: Patient ?Chief Complaint ?Patients presents for treatment of an open diabetic ulcer ?Electronic Signature(s) ?Signed: 12/19/2021 2:50:17 PM By: Fredirick Maudlin MD FACS ?Entered By: Fredirick Maudlin on 12/19/2021 14:50:17 ?-------------------------------------------------------------------------------- ?Debridement Details ?Patient Name: Date of Service: ?Edward Mayo MES L. 12/19/2021 1:15 PM ?Medical Record Number: [id number] ?Patient Account Number: [id number] ?Date of Birth/Sex: Treating RN: ?[birth year] (51 y.o. [name] ?Primary Care Provider: [name] Other Clinician: ?Referring Provider: ?Treating Provider/Extender: Fredirick Maudlin ?Edward [name] ?Weeks in Treatment: 3 ?Debridement Performed for Assessment: Wound #5 Left,Distal,Anterior Lower Leg ?Performed By: Physician Fredirick Maudlin, MD ?Debridement Type: Debridement ?Severity of Tissue Pre Debridement: Fat layer exposed ?Level of Consciousness (Pre-procedure): Awake and Alert ?Pre-procedure Verification/Time Out Yes - 13:50 ?Taken: ?Start Time: 13:50 ?Pain Control: ?Other : benzocaine 20% spray ?T Area Debrided (L x W): ?otal 1.2 (cm) x 2.5 (cm) = 3 (cm?) ?Tissue and other material debrided: Viable, Non-Viable, Slough, Subcutaneous, Slough ?Level: Skin/Subcutaneous Tissue ?Debridement Description: Excisional ?Instrument: Curette ?Bleeding: Minimum ?Hemostasis Achieved: Pressure ?Procedural Pain: 0 ?Post Procedural Pain: 0 ?Response to Treatment: Procedure was  tolerated well ?Level of Consciousness (Post- Awake and Alert ?procedure): ?Post Debridement Measurements of Total Wound ?Length: (cm) 1.2 ?Width: (cm) 2.5 ?Depth: (cm) 0.1 ?Volume: (cm?) 0.236 ?Character of Wound/Ulcer Post Debridement: Improved ?Severity of Tissue Post Debridement: Fat layer exposed ?Post Procedure Diagnosis ?Same as Pre-procedure ?Electronic Signature(s) ?Signed: 12/19/2021 5:10:10 PM By: Fredirick Maudlin MD FACS ?Signed: 12/19/2021 5:38:18 PM By: Baruch Gouty RN, BSN ?Entered By: Baruch Gouty on 12/19/2021 13:52:09 ?-------------------------------------------------------------------------------- ?Debridement Details ?Patient Name: ?Date of Service: ?Edward Mayo MES L. 12/19/2021 1:15 PM ?Medical Record Number: [id number] ?Patient Account Number: [id number] ?Date of Birth/Sex: ?Treating RN: ?16-Aug-1971 (51 y.o. [name] ?Primary Care Provider: [name] ?Other Clinician: ?Referring Provider: ?Treating Provider/Extender: Fredirick Maudlin ?Edward [name] ?Weeks in Treatment: 3 ?Debridement Performed for Assessment: Wound #10 Right,Lateral Lower Leg ?Performed By: Physician Fredirick Maudlin, MD ?Debridement Type: Debridement ?Severity of Tissue Pre Debridement: Fat layer exposed ?Level of Consciousness (Pre-procedure): Awake and Alert ?Pre-procedure Verification/Time Out Yes - 13:50 ?Taken: ?Start Time: 13:50 ?Pain Control: ?Other : benzocaine 20% spray ?T Area Debrided (L x W): ?otal 3.4 (cm) x 4 (cm) = 13.6 (cm?) ?Tissue and other material debrided: Viable, Non-Viable, Slough, Subcutaneous, Slough ?Level: Skin/Subcutaneous Tissue ?Debridement Description: Excisional ?Instrument: Curette ?Bleeding: Minimum ?Hemostasis Achieved: Pressure ?Procedural Pain: 0 ?Post Procedural Pain: 0 ?Response to Treatment: Procedure was tolerated well ?Level of Consciousness (Post- Awake and Alert ?procedure): ?Post Debridement Measurements of Total Wound ?Length: (cm) 3.4 ?Width: (cm) 4 ?Depth: (cm)  0.1 ?Volume: (cm?) 1.068 ?Character of Wound/Ulcer Post Debridement: Requires Further Debridement ?Severity of Tissue Post Debridement: Fat layer exposed ?Post Procedure Diagnosis ?Same as Pre-procedure ?Electronic Signature(s) ?Signed: 12/19/2021 5:10:10 PM By: Fredirick Maudlin MD FACS ?Signed: 12/19/2021 5:38:18 PM By: Baruch Gouty RN, BSN ?Entered By: Baruch Gouty on 12/19/2021 13:53:50 ?-------------------------------------------------------------------------------- ?Debridement Details ?Patient Name: ?Date of Service: ?Edward Mayo MES L. 12/19/2021 1:15 PM ?Medical Record Number: [id number] ?Patient Account Number: [id number] ?Date of Birth/Sex: ?Treating RN: ?1970-11-19 (51 y.o. [name] ?Primary  Care Provider: [name] ?Other Clinician: ?Referring Provider: ?Treating Provider/Extender: Fredirick Maudlin ?Edward [name] ?Weeks in Treatment: 3 ?Debridement Performed for Assessment: Wound #9 Right,Anterior Amputation Site - Below Knee ?Performed By: Physician Fredirick Maudlin, MD ?Debridement Type: Debridement ?Severity of Tissue Pre Debridement: Fat layer exposed ?Level of Consciousness (Pre-procedure): Awake and Alert ?Pre-procedure Verification/Time Out Yes - 13:50 ?Taken: ?Start Time: 13:50 ?Pain Control: ?Other : benzocaine 20% spray ?T Area Debrided (L x W): ?otal 8 (cm) x 6 (cm) = 48 (cm?) ?Tissue and other material debrided: Viable, Non-Viable, Slough, Subcutaneous, Slough ?Level: Skin/Subcutaneous Tissue ?Debridement Description: Excisional ?Instrument: Curette ?Bleeding: Minimum ?Hemostasis Achieved: Pressure ?Procedural Pain: 0 ?Post Procedural Pain: 0 ?Response to Treatment: Procedure was tolerated well ?Level of Consciousness (Post- Awake and Alert ?procedure): ?Post Debridement Measurements of Total Wound ?Length: (cm) 11.7 ?Width: (cm) 13 ?Depth: (cm) 0.1 ?Volume: (cm?) 11.946 ?Character of Wound/Ulcer Post Debridement: Requires Further Debridement ?Severity of Tissue Post Debridement:  Fat layer exposed ?Post Procedure Diagnosis ?Same as Pre-procedure ?Electronic Signature(s) ?Signed: 12/19/2021 5:10:10 PM By: Fredirick Maudlin MD FACS ?Signed: 12/19/2021 5:38:18 PM By: Baruch Gouty RN, BSN ?Entered By: Baruch Gouty on 12/19/2021 13:57:19 ?-------------------------------------------------------------------------------- ?Debridement Details ?Patient Name: ?Date of Service: ?Edward Mayo MES L. 12/19/2021 1:15 PM ?Medical Record Number: [id number] ?Patient Account Number: [id number] ?Date of Birth/Sex: ?Treating RN: ?1971/08/12 (51 y.o. [name] ?Primary Care Provider: [name] ?Other Clinician: ?Referring Provider: ?Treating Provider/Extender: Fredirick Maudlin ?Edward [name] ?Weeks in Treatment: 3 ?Debridement Performed for Assessment: Wound #7 Right,Medial Knee ?Performed By: Physician Fredirick Maudlin, MD ?Debridement Type: Debridement ?Severity of Tissue Pre Debridement: Fat layer exposed ?Level of Consciousness (Pre-procedure): Awake and Alert ?Pre-procedure Verification/Time Out Yes - 13:50 ?Taken: ?Start Time: 13:50 ?Pain Control: ?Other : benzocaine 20% spray ?T Area Debrided (L x W): ?otal 13.4 (cm) x 7.8 (cm) = 104.52 (cm?) ?Tissue and other material debrided: Viable, Non-Viable, Eschar, Slough, Subcutaneous, Slough ?Level: Skin/Subcutaneous Tissue ?Debridement Description: Excisional ?Instrument: Curette ?Bleeding: Minimum ?Hemostasis Achieved: Pressure ?Procedural Pain: 5 ?Post Procedural Pain: 2 ?Response to Treatment: Procedure was tolerated well ?Level of Consciousness (Post- Awake and Alert ?procedure): ?Post Debridement Measurements of Total Wound ?Length: (cm) 13.4 ?Width: (cm) 7.8 ?Depth: (cm) 0.1 ?Volume: (cm?) 8.209 ?Character of Wound/Ulcer Post Debridement: Requires Further Debridement ?Severity of Tissue Post Debridement: Fat layer exposed ?Post Procedure Diagnosis ?Same as Pre-procedure ?Electronic Signature(s) ?Signed: 12/19/2021 5:10:10 PM By: Fredirick Maudlin MD  FACS ?Signed: 12/19/2021 5:38:18 PM By: Baruch Gouty RN, BSN ?Entered By: Baruch Gouty on 12/19/2021 14:00:04 ?-------------------------------------------------------------------------------- ?HPI Deta

## 2021-12-25 ENCOUNTER — Other Ambulatory Visit: Payer: Self-pay

## 2021-12-25 DIAGNOSIS — I96 Gangrene, not elsewhere classified: Secondary | ICD-10-CM

## 2021-12-25 NOTE — Progress Notes (Signed)
Edward, [name] ([id number]) ?Visit Report for 12/12/2021 ?Arrival Information Details ?Patient Name: Date of Service: ?Edward [name] MES L. 12/12/2021 10:15 A M ?Medical Record Number: [id number] ?Patient Account Number: [id number] ?Date of Birth/Sex: Treating RN: ?Edward [name] (51 y.o. Edward [name]) Dellie Catholic ?Primary Care Calirose Mccance: [name] Other Clinician: ?Referring Kemal Amores: ?Treating Mileidy Atkin/Extender: Fredirick Maudlin ?[name] K ?Weeks in Treatment: 2 ?Visit Information History Since Last Visit ?Added or deleted any medications: No ?Patient Arrived: Wheel Chair ?Any new allergies or adverse reactions: No ?Arrival Time: 10:28 ?Had a fall or experienced change in No ?Accompanied By: son ?activities of daily living that Edward affect ?Transfer Assistance: None ?risk of falls: ?Patient Identification Verified: Yes ?Signs or symptoms of abuse/neglect since last visito No ?Secondary Verification Process Completed: Yes ?Hospitalized since last visit: No ?Patient Requires Transmission-Based Precautions: No ?Implantable device outside of the clinic excluding No ?Patient Has Alerts: Yes ?cellular tissue based products placed in the center ?Patient Alerts: Patient on Blood Thinner since last visit: ?Has Dressing in Place as Prescribed: Yes ?Pain Present Now: Yes ?Electronic Signature(s) ?Signed: 12/12/2021 1:00:19 PM By: Sandre Kitty ?Entered By: Sandre Kitty on 12/12/2021 10:29:53 ?-------------------------------------------------------------------------------- ?Encounter Discharge Information Details ?Patient Name: Date of Service: ?Edward [name] MES L. 12/12/2021 10:15 A M ?Medical Record Number: [id number] ?Patient Account Number: [id number] ?Date of Birth/Sex: Treating RN: ?Edward [name] (51 y.o. Edward [name] ?Primary Care Tavarius Grewe: [name] Other Clinician: ?Referring Adrian Specht: ?Treating Kenasia Scheller/Extender: Fredirick Maudlin ?[name] K ?Weeks in Treatment: 2 ?Encounter Discharge Information Items Post  Procedure Vitals ?Discharge Condition: Stable ?Temperature (F): 98.0 ?Ambulatory Status: Wheelchair ?Pulse (bpm): 89 ?Discharge Destination: Home ?Respiratory Rate (breaths/min): 18 ?Transportation: Private Auto ?Blood Pressure (mmHg): 154/93 ?Accompanied By: son ?Schedule Follow-up Appointment: Yes ?Clinical Summary of Care: Patient Declined ?Electronic Signature(s) ?Signed: 12/25/2021 8:25:23 AM By: Sharyn Creamer RN, BSN ?Entered By: Sharyn Creamer on 12/12/2021 16:22:51 ?-------------------------------------------------------------------------------- ?Lower Extremity Assessment Details ?Patient Name: ?Date of Service: ?Edward [name] MES L. 12/12/2021 10:15 A M ?Medical Record Number: [id number] ?Patient Account Number: [id number] ?Date of Birth/Sex: ?Treating RN: ?[name] (51 y.o. Edward [name]) Dellie Catholic ?Primary Care Edward [name]: [name] ?Other Clinician: ?Referring Edward [name]: ?Treating Alera Quevedo/Extender: Fredirick Maudlin ?[name] K ?Weeks in Treatment: 2 ?Edema Assessment ?Assessed: [Left: No] [Right: No] ?Edema: [Left: Ye] [Right: s] ?Calf ?Left: Right: ?Point of Measurement: From Medial Instep 37.3 cm ?Ankle ?Left: Right: ?Point of Measurement: From Medial Instep 23 cm ?Electronic Signature(s) ?Signed: 12/12/2021 1:00:19 PM By: Sandre Kitty ?Signed: 12/13/2021 5:37:51 PM By: Dellie Catholic RN ?Entered By: Sandre Kitty on 12/12/2021 10:40:50 ?-------------------------------------------------------------------------------- ?Multi Wound Chart Details ?Patient Name: ?Date of Service: ?Edward [name] MES L. 12/12/2021 10:15 A M ?Medical Record Number: [id number] ?Patient Account Number: [id number] ?Date of Birth/Sex: ?Treating RN: ?[birth year]/[name] (51 y.o. Edward [name]) Dellie Catholic ?Primary Care Kei Mcelhiney: [name] ?Other Clinician: ?Referring Edward [name]: ?Treating Edward [name]/Extender: Fredirick Maudlin ?[name] K ?Weeks in Treatment: 2 ?Vital Signs ?Height(in): 72 ?Pulse(bpm): 89 ?Weight(lbs): 230 ?Blood Pressure(mmHg):  154/93 ?Body Mass Index(BMI): 31.2 ?Temperature(??F): 98 ?Respiratory Rate(breaths/min): 18 ?Photos: [10:No Photos Right, Lateral Lower Leg] [4:No Photos Left, Anterior Lower Leg] [5:No Photos Left, Distal, Anterior Lower Leg] ?Wound Location: [10:Blister] [4:Blister] [5:Blister] ?Wounding Event: [10:Arterial Insufficiency Ulcer] [4:Diabetic Wound/Ulcer of the Lower] [5:Diabetic Wound/Ulcer of the Lower] ?Primary Etiology: [10:Cataracts, Anemia, Sleep Apnea,] [4:Extremity Cataracts, Anemia, Sleep Apnea,] [5:Extremity Cataracts, Anemia, Sleep Apnea,] ?Comorbid History: [10:Arrhythmia, Congestive Heart Failure, Arrhythmia, Congestive Heart Failure, Coronary Artery Disease, Hypertension, Myocardial Infarction, Hypertension, Myocardial Infarction, Peripheral Venous Disease, Type II  Peripheral Venous  ?Disease, Type II Diabetes, End Stage Renal Disease, Diabetes, End Stage Renal Disease, Osteoarthritis, Osteomyelitis, Neuropathy, Confinement Anxiety 12/05/2021] [4:Coronary Artery Disease, Osteoarthritis, Osteomyelitis, Neuropathy, Confinement Anxiety  ?09/19/2021] [5:Arrhythmia, Congestive Heart Failure, Coronary Artery Disease, Hypertension, Myocardial Infarction, Peripheral Venous Disease, Type II Diabetes, End Stage Renal Disease, Osteoarthritis, Osteomyelitis, Neuropathy, Confinement Anxiety  ?09/19/2021] ?Date Acquired: [10:1] [4:2] [5:2] ?Weeks of Treatment: [10:Open] [4:Open] [5:Open] ?Wound Status: [10:No] [4:No] [5:No] ?Wound Recurrence: [10:3x4.1x0.1] [4:0.7x0.4x0.1] [5:1.3x2.5x0.1] ?Measurements L x W x D (cm) [10:9.66] [4:0.22] [5:2.553] ?A (cm?) : ?rea [10:0.966] [4:0.022] [5:0.255] ?Volume (cm?) : [10:3.90%] [4:76.60%] [5:-12.90%] ?% Reduction in Area: [10:3.90%] [4:76.60%] [5:-12.80%] ?% Reduction in Volume: [10:Full Thickness Without Exposed] [4:Grade 1] [5:Grade 1] ?Classification: [10:Support Structures Medium] [4:Medium] [5:Medium] ?Exudate A mount: [10:Serosanguineous] [4:Serous] [5:Serous] ?Exudate Type:  [10:red, brown] [4:amber] [5:amber] ?Exudate Color: [10:Flat and Intact] [4:Flat and Intact] [5:Flat and Intact] ?Wound Margin: [10:Small (1-33%)] [4:Medium (34-66%)] [5:Large (67-100%)] ?Granulation A mount: [10:Pink, Pale] [4:Pink] [5:Pink] ?Granulation Quality: [10:Large (67-100%)] [4:Medium (34-66%)] [5:Small (1-33%)] ?Necrotic A mount: [10:Adherent Slough] [4:Adherent Slough] [5:Adherent Slough] ?Necrotic Tissue: ?[10:Fat Layer (Subcutaneous Tissue): Yes Fat Layer (Subcutaneous Tissue): Yes Fat Layer (Subcutaneous Tissue): Yes] ?Exposed Structures: ?[10:Fascia: No Tendon: No Muscle: No Joint: No Bone: No Small (1-33%)] [4:Fascia: No Tendon: No Muscle: No Joint: No Bone: No Small (1-33%)] [5:Fascia: No Tendon: No Muscle: No Joint: No Bone: No Small (1-33%)] ?Epithelialization: [10:Debridement - Excisional] [4:Debridement - Excisional] [5:Debridement - Excisional] ?Debridement: ?Pre-procedure Verification/Time Out 11:11 [4:11:11] [5:11:11] ?Taken: [10:Other] [4:Other] [5:Other] ?Pain Control: [10:Subcutaneous, Slough] [4:Subcutaneous, Slough] [5:Subcutaneous, Slough] ?Tissue Debrided: [10:Skin/Subcutaneous Tissue] [4:Skin/Subcutaneous Tissue] [5:Skin/Subcutaneous Tissue] ?Level: [10:12.3] [4:0.28] [5:3.25] ?Debridement A (sq cm): [10:rea Curette] [4:Curette] [5:Curette] ?Instrument: [10:None] [4:None] [5:None] ?Bleeding: [10:Pressure] [4:Pressure] [5:Pressure] ?Hemostasis A chieved: [10:5] [4:0] [5:0] ?Procedural Pain: [10:2] [4:0] [5:0] ?Post Procedural Pain: [10:Procedure was tolerated well] [4:Procedure was tolerated well] [5:Procedure was tolerated well] ?Debridement Treatment Response: [10:3x4.1x0.1] [4:0.7x0.4x0.1] [5:1.3x2.5x0.1] ?Post Debridement Measurements L x ?W x D (cm) [10:0.966] [4:0.022] [5:0.255] ?Post Debridement Volume: (cm?) [10:Debridement] [4:Debridement] [5:Debridement] ?Wound Number: '6 7 8 '$ ?Photos: No Photos No Photos No Photos ?Left, Medial Lower Leg Right, Medial Knee Right, Posterior  Amputation Site - ?Wound Location: ?Below Knee ?Blister Blister Blister ?Wounding Event: ?Diabetic Wound/Ulcer of the Lower Diabetic Wound/Ulcer of the Lower Diabetic Wound/Ulcer of the Lower ?Primary Etiolo

## 2021-12-25 NOTE — Progress Notes (Signed)
AABAN, GRIEP ([id number]) ?Visit Report for 12/12/2021 ?Chief Complaint Document Details ?Patient Name: Date of Service: ?Edward Mayo MES L. 12/12/2021 10:15 A M ?Medical Record Number: [id number] ?Patient Account Number: [id number] ?Date of Birth/Sex: Treating RN: ?Jan 05, 1971 (51 y.o. [name]) Dellie Catholic ?Primary Care Provider: [name] Other Clinician: ?Referring Provider: ?Treating Provider/Extender: Fredirick Maudlin ?[name] K ?Weeks in Treatment: 2 ?Information Obtained from: Patient ?Chief Complaint ?Patients presents for treatment of an open diabetic ulcer ?Electronic Signature(s) ?Signed: 12/12/2021 11:50:32 AM By: Fredirick Maudlin MD FACS ?Entered By: Fredirick Maudlin on 12/12/2021 11:50:32 ?-------------------------------------------------------------------------------- ?Debridement Details ?Patient Name: Date of Service: ?Edward Mayo MES L. 12/12/2021 10:15 A M ?Medical Record Number: [id number] ?Patient Account Number: [id number] ?Date of Birth/Sex: Treating RN: ?21-Jul-1971 (51 y.o. Edward [name] ?Primary Care Provider: [name] Other Clinician: ?Referring Provider: ?Treating Provider/Extender: Fredirick Maudlin ?[name] K ?Weeks in Treatment: 2 ?Debridement Performed for Assessment: Wound #9 Right,Anterior Amputation Site - Below Knee ?Performed By: Physician Fredirick Maudlin, MD ?Debridement Type: Debridement ?Severity of Tissue Pre Debridement: Fat layer exposed ?Level of Consciousness (Pre-procedure): Awake and Alert ?Pre-procedure Verification/Time Out Yes - 11:11 ?Taken: ?Start Time: 11:11 ?Pain Control: ?Other : benzocaine 20%spray ?T Area Debrided (L x W): ?otal 7.5 (cm) x 2.9 (cm) = 21.75 (cm?) ?Tissue and other material debrided: Non-Viable, [place], [place] ?Level: Non-Viable Tissue ?Debridement Description: Selective/Open Wound ?Instrument: Curette ?Bleeding: None ?Hemostasis Achieved: Pressure ?Procedural Pain: 0 ?Post Procedural Pain: 0 ?Response to Treatment: Procedure was  tolerated well ?Level of Consciousness (Post- Awake and Alert ?procedure): ?Post Debridement Measurements of Total Wound ?Length: (cm) 7.5 ?Width: (cm) 2.9 ?Depth: (cm) 0.1 ?Volume: (cm?) 1.708 ?Character of Wound/Ulcer Post Debridement: Improved ?Severity of Tissue Post Debridement: Fat layer exposed ?Post Procedure Diagnosis ?Same as Pre-procedure ?Electronic Signature(s) ?Signed: 12/12/2021 5:05:56 PM By: Fredirick Maudlin MD FACS ?Signed: 12/25/2021 8:25:23 AM By: Sharyn Creamer RN, BSN ?Entered By: Sharyn Creamer on 12/12/2021 11:15:56 ?-------------------------------------------------------------------------------- ?Debridement Details ?Patient Name: ?Date of Service: ?Edward Mayo MES L. 12/12/2021 10:15 A M ?Medical Record Number: [id number] ?Patient Account Number: [id number] ?Date of Birth/Sex: ?Treating RN: ?Jul 23, 1971 (51 y.o. Edward [name] ?Primary Care Provider: [name] ?Other Clinician: ?Referring Provider: ?Treating Provider/Extender: Fredirick Maudlin ?[name] K ?Weeks in Treatment: 2 ?Debridement Performed for Assessment: Wound #10 Right,Lateral Lower Leg ?Performed By: Physician Fredirick Maudlin, MD ?Debridement Type: Debridement ?Severity of Tissue Pre Debridement: Fat layer exposed ?Level of Consciousness (Pre-procedure): Awake and Alert ?Pre-procedure Verification/Time Out Yes - 11:11 ?Taken: ?Start Time: 11:11 ?Pain Control: ?Other : benzocaine 20%spray ?T Area Debrided (L x W): ?otal 3 (cm) x 4.1 (cm) = 12.3 (cm?) ?Tissue and other material debrided: Non-Viable, [place], Subcutaneous, [place] ?Level: Skin/Subcutaneous Tissue ?Debridement Description: Excisional ?Instrument: Curette ?Bleeding: None ?Hemostasis Achieved: Pressure ?Procedural Pain: 5 ?Post Procedural Pain: 2 ?Response to Treatment: Procedure was tolerated well ?Level of Consciousness (Post- Awake and Alert ?procedure): ?Post Debridement Measurements of Total Wound ?Length: (cm) 3 ?Width: (cm) 4.1 ?Depth: (cm) 0.1 ?Volume:  (cm?) 0.966 ?Character of Wound/Ulcer Post Debridement: Improved ?Severity of Tissue Post Debridement: Fat layer exposed ?Post Procedure Diagnosis ?Same as Pre-procedure ?Electronic Signature(s) ?Signed: 12/12/2021 5:05:56 PM By: Fredirick Maudlin MD FACS ?Signed: 12/25/2021 8:25:23 AM By: Sharyn Creamer RN, BSN ?Entered By: Sharyn Creamer on 12/12/2021 11:17:25 ?-------------------------------------------------------------------------------- ?Debridement Details ?Patient Name: ?Date of Service: ?Edward Mayo MES L. 12/12/2021 10:15 A M ?Medical Record Number: [id number] ?Patient Account Number: [id number] ?Date of Birth/Sex: ?Treating RN: ?07/22/1971 (51 y.o. Edward [name] ?  Primary Care Provider: [name] ?Other Clinician: ?Referring Provider: ?Treating Provider/Extender: Fredirick Maudlin ?[name] K ?Weeks in Treatment: 2 ?Debridement Performed for Assessment: Wound #7 Right,Medial Knee ?Performed By: Physician Fredirick Maudlin, MD ?Debridement Type: Debridement ?Severity of Tissue Pre Debridement: Fat layer exposed ?Level of Consciousness (Pre-procedure): Awake and Alert ?Pre-procedure Verification/Time Out Yes - 11:11 ?Taken: ?Start Time: 11:11 ?Pain Control: ?Other : benzocaine 20%spray ?T Area Debrided (L x W): ?otal 13 (cm) x 8.7 (cm) = 113.1 (cm?) ?Tissue and other material debrided: Non-Viable, [place], [place], Subcutaneous, [place] ?Level: Skin/Subcutaneous Tissue ?Debridement Description: Excisional ?Instrument: Curette ?Bleeding: None ?Hemostasis Achieved: Pressure ?Procedural Pain: 0 ?Post Procedural Pain: 0 ?Response to Treatment: Procedure was tolerated well ?Level of Consciousness (Post- Awake and Alert ?procedure): ?Post Debridement Measurements of Total Wound ?Length: (cm) 13 ?Width: (cm) 8.7 ?Depth: (cm) 0.1 ?Volume: (cm?) 8.883 ?Character of Wound/Ulcer Post Debridement: Improved ?Severity of Tissue Post Debridement: Fat layer exposed ?Post Procedure Diagnosis ?Same as  Pre-procedure ?Electronic Signature(s) ?Signed: 12/12/2021 5:05:56 PM By: Fredirick Maudlin MD FACS ?Signed: 12/25/2021 8:25:23 AM By: Sharyn Creamer RN, BSN ?Entered By: Sharyn Creamer on 12/12/2021 11:21:06 ?-------------------------------------------------------------------------------- ?Debridement Details ?Patient Name: ?Date of Service: ?Edward Mayo MES L. 12/12/2021 10:15 A M ?Medical Record Number: [id number] ?Patient Account Number: [id number] ?Date of Birth/Sex: ?Treating RN: ?1970/10/24 (51 y.o. Edward [name] ?Primary Care Provider: [name] ?Other Clinician: ?Referring Provider: ?Treating Provider/Extender: Fredirick Maudlin ?[name] K ?Weeks in Treatment: 2 ?Debridement Performed for Assessment: Wound #6 Left,Medial Lower Leg ?Performed By: Physician Fredirick Maudlin, MD ?Debridement Type: Debridement ?Severity of Tissue Pre Debridement: Fat layer exposed ?Level of Consciousness (Pre-procedure): Awake and Alert ?Pre-procedure Verification/Time Out Yes - 11:11 ?Taken: ?Start Time: 11:11 ?Pain Control: ?Other : benzocaine 20%spray ?T Area Debrided (L x W): ?otal 2.3 (cm) x 1.1 (cm) = 2.53 (cm?) ?Tissue and other material debrided: Non-Viable, [place], [place], Subcutaneous, [place] ?Level: Skin/Subcutaneous Tissue ?Debridement Description: Excisional ?Instrument: Curette ?Bleeding: None ?Hemostasis Achieved: Pressure ?Procedural Pain: 0 ?Post Procedural Pain: 0 ?Response to Treatment: Procedure was tolerated well ?Level of Consciousness (Post- Awake and Alert ?procedure): ?Post Debridement Measurements of Total Wound ?Length: (cm) 2.3 ?Width: (cm) 1.1 ?Depth: (cm) 0.1 ?Volume: (cm?) 0.199 ?Character of Wound/Ulcer Post Debridement: Improved ?Severity of Tissue Post Debridement: Fat layer exposed ?Post Procedure Diagnosis ?Same as Pre-procedure ?Electronic Signature(s) ?Signed: 12/12/2021 5:05:56 PM By: Fredirick Maudlin MD FACS ?Signed: 12/25/2021 8:25:23 AM By: Sharyn Creamer RN, BSN ?Entered By: Sharyn Creamer on 12/12/2021 11:24:07 ?-------------------------------------------------------------------------------- ?Debridement Details ?Patient Name: ?Date of Service: ?Edward Mayo MES L. 12/12/2021 10:15 A M ?Medical Record [place]

## 2021-12-26 ENCOUNTER — Encounter: Payer: Self-pay | Admitting: Vascular Surgery

## 2021-12-26 ENCOUNTER — Encounter (HOSPITAL_BASED_OUTPATIENT_CLINIC_OR_DEPARTMENT_OTHER): Payer: Medicare Other | Admitting: General Surgery

## 2021-12-26 ENCOUNTER — Ambulatory Visit (INDEPENDENT_AMBULATORY_CARE_PROVIDER_SITE_OTHER): Payer: Medicare Other | Admitting: Vascular Surgery

## 2021-12-26 ENCOUNTER — Ambulatory Visit (HOSPITAL_COMMUNITY)
Admission: RE | Admit: 2021-12-26 | Discharge: 2021-12-26 | Disposition: A | Discharge status: Home / Self Care | Payer: Medicare Other | Source: Ambulatory Visit | Attending: Vascular Surgery | Admitting: Vascular Surgery

## 2021-12-26 VITALS — BP 131/73 | HR 89 | Temp 98.2°F | Resp 20 | Ht 72.0 in | Wt 229.0 lb

## 2021-12-26 DIAGNOSIS — Z992 Dependence on renal dialysis: Secondary | ICD-10-CM

## 2021-12-26 DIAGNOSIS — N186 End stage renal disease: Secondary | ICD-10-CM | POA: Diagnosis not present

## 2021-12-26 DIAGNOSIS — I739 Peripheral vascular disease, unspecified: Secondary | ICD-10-CM | POA: Diagnosis not present

## 2021-12-26 DIAGNOSIS — L97822 Non-pressure chronic ulcer of other part of left lower leg with fat layer exposed: Secondary | ICD-10-CM | POA: Diagnosis not present

## 2021-12-26 DIAGNOSIS — I96 Gangrene, not elsewhere classified: Secondary | ICD-10-CM | POA: Insufficient documentation

## 2021-12-26 NOTE — H&P (View-Only) (Signed)
Patient ID: Edward [name], male   DOB: 10/20/1970, 51 y.o.   MRN: [id number]  Reason for Consult: No chief complaint on file.   Referred by [name], MD  Subjective:     HPI:  Edward [name] is a 51 y.o. male with end-stage renal disease on dialysis via left arm fistula which has been revised in the past.  He has recently undergone left index finger amputation also has a ring finger amputation this past.  On the right side he now has gangrenous changes to the right index and middle finger.  For duplex today.  He has a wound in the left leg which she is states is healing he also has right below-knee amputation site wound which is followed at the wound care center.  Previously underwent left lower extremity intervention.  He is on aspirin, statin and Plavix.  He dialyzes on Tuesdays, Thursdays and Saturdays.  Past Medical History:  Diagnosis Date   Allergy    Anemia    getting Venofer with dialysis   Anxiety    self reported, sometimes-situational anxiety   Arthritis    bilateral hands/LEFT hip   CHF (congestive heart failure) (HCC)    Chronic kidney disease    t th s dialysis- [place] ---ESRD   Complication of anesthesia    slow to go to sleep   Coronary artery disease    Depression    self reported, sometimes-situational depression   Diabetes mellitus    type II- on meds   Dyspnea    sometimes- fluid   GERD (gastroesophageal reflux disease)    on meds   Hypertension    no longer on medication since starting on Hemodialysis   Myocardial infarction ([place]) 2019   Sleep apnea    Family History  Problem Relation Age of Onset   Diabetes Other    Pancreatic cancer Mother 54   Colon cancer Neg Hx    Colon polyps Neg Hx    Esophageal cancer Neg Hx    Rectal cancer Neg Hx    Stomach cancer Neg Hx    Past Surgical History:  Procedure Laterality Date   A/V FISTULAGRAM Left 03/06/2020   Procedure: A/V FISTULAGRAM;  Surgeon: Waynetta Sandy, MD;  Location: [facility];  Service: Cardiovascular;  Laterality: Left;   ABDOMINAL AORTOGRAM W/LOWER EXTREMITY N/A 04/09/2021   Procedure: ABDOMINAL AORTOGRAM W/LOWER EXTREMITY;  Surgeon: Waynetta Sandy, MD;  Location: [place] [facility];  Service: Cardiovascular;  Laterality: N/A;   AMPUTATION Left 05/10/2016   Procedure: Left Transmetatarsal Amputation;  Surgeon: [name], MD;  Location: [place];  Service: Orthopedics;  Laterality: Left;   AMPUTATION Right 05/16/2018   Procedure: PARTIAL RAY AMPUTATION FIFTH TOE RIGHT FOOT;  Surgeon: [name], DPM;  Location: [place];  Service: Podiatry;  Laterality: Right;   AMPUTATION Right 06/17/2018   Procedure: AMPUTATION 4th RAY Right Foot;  Surgeon: Trula Slade, DPM;  Location: [place];  Service: Podiatry;  Laterality: Right;   AMPUTATION Right 06/19/2018   Procedure: RIGHT BELOW KNEE AMPUTATION;  Surgeon: [name], MD;  Location: [place];  Service: Orthopedics;  Laterality: Right;   AMPUTATION Left 10/05/2021   Procedure: LEFT RING FINGER AMPUTATION;  Surgeon: Daryll Brod, MD;  Location: [place];  Service: Orthopedics;  Laterality: Left;  45 MIN   AMPUTATION Left 11/15/2021   Procedure: LEFT INDEX FINGER AMPUTATION;  Surgeon: Leanora Cover, MD;  Location: [place];  Service:  Orthopedics;  Laterality: Left;  60 MIN   APPLICATION OF WOUND VAC Right 06/19/2018   Procedure: APPLICATION OF WOUND VAC;  Surgeon: [name], MD;  Location: [place];  Service: Orthopedics;  Laterality: Right;   AV FISTULA PLACEMENT Left 11/25/2019   Procedure: LEFT ARM Brachiocephalic  ARTERIOVENOUS (AV) FISTULA CREATION;  Surgeon: Rosetta Posner, MD;  Location: [place];  Service: Vascular;  Laterality: Left;   [place] Left 07/19/2020   Procedure: LEFT SECOND STAGE [place];  Surgeon: Waynetta Sandy, MD;  Location: [place];  Service: Vascular;  Laterality: Left;   CARDIAC CATHETERIZATION  07/29/2018   CIRCUMCISION  09/02/2011    Procedure: CIRCUMCISION ADULT;  Surgeon: Molli Hazard, MD;  Location: [place];  Service: Urology;  Laterality: N/A;   CORONARY STENT INTERVENTION N/A 07/29/2018   Procedure: CORONARY STENT INTERVENTION;  Surgeon: Leonie Man, MD;  Location: [place] [facility];  Service: Cardiovascular;  Laterality: N/A;   DEBRIDEMENT AND CLOSURE WOUND Left 11/15/2021   Procedure: LEFT HAND WOUND CLOSURE;  Surgeon: Leanora Cover, MD;  Location: [place];  Service: Orthopedics;  Laterality: Left;   I & D EXTREMITY  09/02/2011   Procedure: IRRIGATION AND DEBRIDEMENT EXTREMITY;  Surgeon: Theodoro Kos, DO;  Location: [place];  Service: Plastics;  Laterality: N/A;   INCISION AND DRAINAGE OF WOUND  08/12/2011   Procedure: IRRIGATION AND DEBRIDEMENT WOUND;  Surgeon: Zenovia Jarred, MD;  Location: [place];  Service: General;;  perineum   INSERTION OF DIALYSIS CATHETER N/A 11/25/2019   Procedure: INSERTION OF Righ Internal Jugular DIALYSIS CATHETER;  Surgeon: Rosetta Posner, MD;  Location: [place];  Service: Vascular;  Laterality: N/A;   [place]  08/12/2011   Procedure: IRRIGATION AND DEBRIDEMENT ABSCESS;  Surgeon: Molli Hazard, MD;  Location: [place];  Service: Urology;  Laterality: N/A;  irrigation and debridment perineum     IRRIGATION AND DEBRIDEMENT ABSCESS  08/14/2011   Procedure: MINOR INCISION AND DRAINAGE OF ABSCESS;  Surgeon: Molli Hazard, MD;  Location: [place];  Service: Urology;  Laterality: N/A;  Perineal Wound Debridement;Placement Bilateral Testicular Thigh Pouches   LEFT HEART CATH AND CORONARY ANGIOGRAPHY N/A 07/29/2018   Procedure: LEFT HEART CATH AND CORONARY ANGIOGRAPHY;  Surgeon: Leonie Man, MD;  Location: [place] [facility];  Service: Cardiovascular;  Laterality: N/A;   PERIPHERAL VASCULAR ATHERECTOMY Left 04/09/2021   Procedure: PERIPHERAL VASCULAR ATHERECTOMY;  Surgeon: Waynetta Sandy, MD;  Location: [facility];  Service: Cardiovascular;  Laterality: Left;  TP trunk and peroneal arteries   PERIPHERAL VASCULAR BALLOON ANGIOPLASTY Left 04/09/2021   Procedure: PERIPHERAL VASCULAR BALLOON ANGIOPLASTY;  Surgeon: Waynetta Sandy, MD;  Location: [facility];  Service: Cardiovascular;  Laterality: Left;  Popliteal   REVISON OF ARTERIOVENOUS FISTULA Left 05/24/2020   Procedure: LEFT ARM ARTERIOVENOUS FISTULA  CONVERSION TO BASILIC VEIN FISTULA;  Surgeon: Waynetta Sandy, MD;  Location: [place];  Service: Vascular;  Laterality: Left;   TOTAL HIP ARTHROPLASTY Left 10/11/2016   Procedure: LEFT TOTAL HIP ARTHROPLASTY ANTERIOR APPROACH;  Surgeon: Mcarthur Rossetti, MD;  Location: [facility];  Service: Orthopedics;  Laterality: Left;    Short Social History:  Social History   Tobacco Use   Smoking status: Former    Packs/day: 0.25    Years: 24.00    Pack years: 6.00    Types: Cigarettes    Quit date: 11/19/2019  Years since quitting: 2.1   Smokeless tobacco: Never  Substance Use Topics   Alcohol use: Not Currently    Alcohol/week: 0.0 standard drinks    Comment: quit 2018    Allergies  Allergen Reactions   Crestor [Rosuvastatin] Rash    Also had a red eye   Dilaudid [Hydromorphone] Itching   Benadryl [Diphenhydramine] Rash    Current Outpatient Medications  Medication Sig Dispense Refill   acetaminophen (TYLENOL) 500 MG tablet Take 500-1,000 mg by mouth every 6 (six) hours as needed for moderate pain or headache.     aspirin EC 81 MG tablet Take 1 tablet (81 mg total) by mouth daily. Swallow whole. 90 tablet 3   atorvastatin (LIPITOR) 80 MG tablet Take 1 tablet (80 mg total) by mouth daily. (Patient taking differently: Take 80 mg by mouth at bedtime.) 90 tablet 3   bismuth subsalicylate (PEPTO BISMOL) 262 MG/15ML suspension Take 30 mLs by mouth every 6 (six) hours as needed for indigestion.     blood glucose meter kit and supplies Dispense based on patient  and insurance preference. Use up to four times daily as directed. (FOR ICD-9 250.00, 250.01). 1 each 0   clopidogrel (PLAVIX) 75 MG tablet Take 1 tablet (75 mg total) by mouth daily. (Patient not taking: Reported on 11/12/2021) 30 tablet 11   insulin aspart protamine - aspart (NOVOLOG MIX 70/30 FLEXPEN) (70-30) 100 UNIT/ML FlexPen Inject 0.3 mLs (30 Units total) into the skin 2 (two) times daily with a meal. 15 mL 0   Insulin Pen Needle 31G X 5 MM MISC Use daily to inject insulin as instructed 100 each 2   lidocaine-prilocaine (EMLA) cream Apply 1 application topically See admin instructions. Apply a small amount to access site (AVF) 1-2 hours before dialysis. Cover with occlusive dressing (saran wrap). Tuesday,Thursday,saturday     linaclotide (LINZESS) 145 MCG CAPS capsule Take 1 capsule (145 mcg total) by mouth daily before breakfast. (Patient not taking: Reported on 09/21/2021) 8 capsule 0   linaclotide (LINZESS) 72 MCG capsule Take 1 capsule (72 mcg total) by mouth daily before breakfast. (Patient taking differently: Take 72 mcg by mouth daily as needed (constipation).) 8 capsule 0   pantoprazole (PROTONIX) 40 MG tablet Take 1 tablet (40 mg total) by mouth in the morning and at bedtime. (Patient taking differently: Take 40 mg by mouth daily.) 60 tablet 3   polyethylene glycol (MIRALAX) 17 g packet Take 17 g by mouth daily. (Patient not taking: Reported on 09/21/2021) 14 each 0   predniSONE (DELTASONE) 20 MG tablet Take 1 tablet by mouth in the morning and at bedtime. (Patient not taking: Reported on 09/21/2021)     sulfamethoxazole-trimethoprim (BACTRIM DS) 800-160 MG tablet Take 1 tablet by mouth 2 (two) times daily.     triamcinolone ointment (KENALOG) 0.1 % 1 application. daily as needed (rash).     VELPHORO 500 MG chewable tablet Chew 1,000 mg by mouth 3 (three) times daily with meals.     No current facility-administered medications for this visit.    Review of Systems  Constitutional:   Constitutional negative. HENT: HENT negative.  Eyes: Eyes negative.  Respiratory: Respiratory negative.  Cardiovascular: Cardiovascular negative.  GI: Gastrointestinal negative.  Musculoskeletal: Musculoskeletal negative.  Skin: Positive for wound.  Neurological: Neurological negative. Hematologic: Hematologic/lymphatic negative.  Psychiatric: Psychiatric negative.       Objective:  Objective  Vitals:   12/26/21 1535  BP: 131/73  Pulse: 89  Resp: 20  Temp: 98.2 F (  36.8 C)  SpO2: 97%     Physical Exam HENT:     Head: Normocephalic.     Nose: Nose normal.  Eyes:     Pupils: Pupils are equal, round, and reactive to light.  Cardiovascular:     Rate and Rhythm: Normal rate.     Comments: There is a weekend signal in the right ulnar artery but it can be traced into the arch On the left there is a strong radial signal but no real ulnar signal to speak of in a dressing on the hand Pulmonary:     Effort: Pulmonary effort is normal.  Abdominal:     General: Abdomen is flat.     Palpations: Abdomen is soft.  Musculoskeletal:        General: Normal range of motion.     Cervical back: Normal range of motion.     Comments: Dressing in place on right below-knee amputation Dressing in place left lower extremity below the knee  Skin:    General: Skin is warm.     Capillary Refill: Capillary refill takes less than 2 seconds.  Neurological:     General: No focal deficit present.     Mental Status: He is alert.  Psychiatric:        Mood and Affect: Mood normal.        Behavior: Behavior normal.    Data: Right Doppler Findings:  +---------------+----------+----------+--------+--------------------------+   Site           PSV (cm/s)Waveform  StenosisComments                      +---------------+----------+----------+--------+--------------------------+   Subclavian Prox75        triphasic                                        +---------------+----------+----------+--------+--------------------------+   Subclavian Mid 78        triphasic                                       +---------------+----------+----------+--------+--------------------------+   Subclavian Dist65        triphasic                                       +---------------+----------+----------+--------+--------------------------+   Axillary       65        triphasic                                       +---------------+----------+----------+--------+--------------------------+   Brachial Prox  64        triphasic                                       +---------------+----------+----------+--------+--------------------------+   Brachial Mid   53        triphasic                                       +---------------+----------+----------+--------+--------------------------+  Brachial Dist  57        triphasic                                       +---------------+----------+----------+--------+--------------------------+   Radial Prox    44        triphasic                                       +---------------+----------+----------+--------+--------------------------+   Radial Mid     32        triphasic                                       +---------------+----------+----------+--------+--------------------------+   Radial Dist    23 / 0    monophasic        occlusion / near  occlusion  +---------------+----------+----------+--------+--------------------------+   Ulnar Prox     64        triphasic                                       +---------------+----------+----------+--------+--------------------------+   Ulnar Mid      42        triphasic                                       +---------------+----------+----------+--------+--------------------------+   Ulnar Dist     50        triphasic                                        +---------------+----------+----------+--------+--------------------------+    Summary:     Right: No color flow or Doppler signal in the distal radial artery         consistent with occlusion / near occlusion, limited         visualization.      Assessment/Plan:    51 year old male on dialysis Tuesdays, Thursdays and Saturdays via left arm AV fistula which is working well unfortunately has some recent finger amputations but he states these are healing he has a very strong radial pulse does not really augment with compression of the fistula.  Right side has some gangrenous changes to some fingers with only ulnar runoff.  We will begin with arch angiogram to evaluate the right upper extremity and then performed bilateral lower extremity angiography to evaluate for healing possibility of the right below-knee amputation wound in the left lower extremity wound which I recently intervened upon.  I had discussed possible retrograde anterior tibial artery revascularization but if the wound is healed he should not need this.  We will get this set up on a nondialysis day in the near future.  He can continue aspirin and Plavix in the perioperative timeframe.     Waynetta Sandy MD [facility]

## 2021-12-26 NOTE — Progress Notes (Signed)
ERRICK, SALTS ([id number]) ?Visit Report for 12/26/2021 ?Arrival Information Details ?Patient Name: Date of Service: ?Edward Mayo MES L. 12/26/2021 10:00 A M ?Medical Record Number: [id number] ?Patient Account Number: [id number] ?Date of Birth/Sex: Treating RN: ?Dec 07, 1970 (51 y.o. Janyth Contes ?Primary Care Macayla Ekdahl: [name] Other Clinician: ?Referring Karolyna Bianchini: ?Treating Yigit Norkus/Extender: Fredirick Maudlin ?[name] K ?Weeks in Treatment: 4 ?Visit Information History Since Last Visit ?Added or deleted any medications: No ?Patient Arrived: Wheel Chair ?Any new allergies or adverse reactions: No ?Arrival Time: 10:12 ?Had a fall or experienced change in No ?Accompanied By: son ?activities of daily living that may affect ?Transfer Assistance: None ?risk of falls: ?Patient Identification Verified: Yes ?Signs or symptoms of abuse/neglect since last visito No ?Secondary Verification Process Completed: Yes ?Hospitalized since last visit: No ?Patient Requires Transmission-Based Precautions: No ?Implantable device outside of the clinic excluding No ?Patient Has Alerts: Yes ?cellular tissue based products placed in the center ?Patient Alerts: Patient on Blood Thinner since last visit: ?Has Dressing in Place as Prescribed: Yes ?Pain Present Now: No ?Electronic Signature(s) ?Signed: 12/26/2021 5:07:23 PM By: Adline Peals ?Entered By: Adline Peals on 12/26/2021 10:15:15 ?-------------------------------------------------------------------------------- ?Encounter Discharge Information Details ?Patient Name: Date of Service: ?Edward Mayo MES L. 12/26/2021 10:00 A M ?Medical Record Number: [id number] ?Patient Account Number: [id number] ?Date of Birth/Sex: Treating RN: ?Apr 21, 1971 (51 y.o. Janyth Contes ?Primary Care Latice Waitman: [name] Other Clinician: ?Referring Odaly Peri: ?Treating Marshia Tropea/Extender: Fredirick Maudlin ?[name] K ?Weeks in Treatment: 4 ?Encounter Discharge Information Items  Post Procedure Vitals ?Discharge Condition: Stable ?Temperature (F): 97.7 ?Ambulatory Status: Wheelchair ?Pulse (bpm): 79 ?Discharge Destination: Home ?Respiratory Rate (breaths/min): 18 ?Transportation: Private Auto ?Blood Pressure (mmHg): 104/66 ?Accompanied By: son ?Schedule Follow-up Appointment: Yes ?Clinical Summary of Care: Patient Declined ?Electronic Signature(s) ?Signed: 12/26/2021 5:07:23 PM By: Adline Peals ?Entered By: Adline Peals on 12/26/2021 14:51:13 ?-------------------------------------------------------------------------------- ?Lower Extremity Assessment Details ?Patient Name: ?Date of Service: ?Edward Mayo MES L. 12/26/2021 10:00 A M ?Medical Record Number: [id number] ?Patient Account Number: [id number] ?Date of Birth/Sex: ?Treating RN: ?22-Feb-1971 (51 y.o. Janyth Contes ?Primary Care Cadell Gabrielson: [name] ?Other Clinician: ?Referring Ayanah Snader: ?Treating Timoty Bourke/Extender: Fredirick Maudlin ?[name] K ?Weeks in Treatment: 4 ?Edema Assessment ?Assessed: [Left: No] [Right: No] ?Edema: [Left: Ye] [Right: s] ?Calf ?Left: Right: ?Point of Measurement: From Medial Instep 38.4 cm ?Ankle ?Left: Right: ?Point of Measurement: From Medial Instep 23.8 cm ?Vascular Assessment ?Pulses: ?Dorsalis Pedis ?Palpable: [Left:Yes] ?Electronic Signature(s) ?Signed: 12/26/2021 5:07:23 PM By: Adline Peals ?Entered By: Adline Peals on 12/26/2021 10:24:22 ?-------------------------------------------------------------------------------- ?Multi Wound Chart Details ?Patient Name: ?Date of Service: ?Edward Mayo MES L. 12/26/2021 10:00 A M ?Medical Record Number: [id number] ?Patient Account Number: [id number] ?Date of Birth/Sex: ?Treating RN: ?June 18, 1971 (51 y.o. [name] ?Primary Care Kajuana Shareef: [name] ?Other Clinician: ?Referring Samaiyah Howes: ?Treating Rozalia Dino/Extender: Fredirick Maudlin ?[name] K ?Weeks in Treatment: 4 ?Vital Signs ?Height(in): 72 ?Pulse(bpm):  79 ?Weight(lbs): 230 ?Blood Pressure(mmHg): 104/66 ?Body Mass Index(BMI): 31.2 ?Temperature(??F): 97.7 ?Respiratory Rate(breaths/min): 18 ?Photos: [10:No Photos Right, Lateral Lower Leg] [5:No Photos Left, Distal, Anterior Lower Leg] [6:No Photos Left, Medial Lower Leg] ?Wound Location: [10:Blister] [5:Blister] [6:Blister] ?Wounding Event: [10:Arterial Insufficiency Ulcer] [5:Diabetic Wound/Ulcer of the Lower] [6:Diabetic Wound/Ulcer of the Lower] ?Primary Etiology: [10:Cataracts, Anemia, Sleep Apnea,] [5:Extremity Cataracts, Anemia, Sleep Apnea,] [6:Extremity Cataracts, Anemia, Sleep Apnea,] ?Comorbid History: [10:Arrhythmia, Congestive Heart Failure, Arrhythmia, Congestive Heart Failure, Coronary Artery Disease, Hypertension, Myocardial Infarction, Hypertension, Myocardial Infarction, Peripheral Venous Disease, Type II Peripheral Venous  ?  Disease, Type II Peripheral Venous Disease, Type II Diabetes, End Stage Renal Disease, Diabetes, End Stage Renal Disease, Diabetes, End Stage Renal Disease, Osteoarthritis, Osteomyelitis, Neuropathy, Confinement Anxiety 12/05/2021] [5:Coronary Artery  ?Disease, Osteoarthritis, Osteomyelitis, Neuropathy, Confinement Anxiety 09/19/2021] [6:Arrhythmia, Congestive Heart Failure, Coronary Artery Disease, Hypertension, Myocardial Infarction, Osteoarthritis, Osteomyelitis, Neuropathy, Confinement Anxiety  ?09/19/2021] ?Date Acquired: [10:3] [5:4] [6:4] ?Weeks of Treatment: [10:Open] [5:Open] [6:Open] ?Wound Status: [10:No] [5:No] [6:No] ?Wound Recurrence: [10:3.3x3.5x0.1] [5:1.2x2.3x0.1] [6:1.4x0.6x0.1] ?Measurements L x W x D (cm) [10:9.071] [5:2.168] [6:0.66] ?A (cm?) : ?rea [88:2.800] [5:0.217] [6:0.066] ?Volume (cm?) : [10:9.80%] [5:4.20%] [6:82.50%] ?% Reduction in A [10:rea: 9.80%] [5:4.00%] [6:82.50%] ?% Reduction in Volume: [10:Full Thickness Without Exposed] [5:Grade 1] [6:Grade 1] ?Classification: [10:Support Structures Medium] [5:Medium] [6:Medium] ?Exudate A mount:  [10:Serosanguineous] [5:Serous] [6:Serous] ?Exudate Type: [10:red, brown] [5:amber] [6:amber] ?Exudate Color: [10:Flat and Intact] [5:Flat and Intact] [6:Flat and Intact] ?Wound Margin: [10:None Present (0%)] [5:Small (1-33%)] [6:Large (67-100%)] ?Granulation A mount: [10:N/A] [5:Red, Pink] [6:Red] ?Granulation Quality: [10:Large (67-100%)] [5:Large (67-100%)] [6:None Present (0%)] ?Necrotic A mount: [10:Eschar] [5:Adherent Slough] [6:N/A] ?Necrotic Tissue: ?[10:Fat Layer (Subcutaneous Tissue): Yes Fat Layer (Subcutaneous Tissue): Yes Fat Layer (Subcutaneous Tissue): Yes] ?Exposed Structures: ?[10:Fascia: No Tendon: No Muscle: No Joint: No Bone: No Small (1-33%)] [5:Fascia: No Tendon: No Muscle: No Joint: No Bone: No Small (1-33%)] [6:Fascia: No Tendon: No Muscle: No Joint: No Bone: No Small (1-33%)] ?Epithelialization: [10:Debridement - Selective/Open Wound Debridement - Excisional] [6:N/A] ?Debridement: ?Pre-procedure Verification/Time Out 10:37 [5:10:37] [6:N/A] ?Taken: [10:Other] [5:Other] [6:N/A] ?Pain Control: [10:Necrotic/Eschar] [5:Subcutaneous, Slough] [6:N/A] ?Tissue Debrided: [10:Non-Viable Tissue] [5:Skin/Subcutaneous Tissue] [6:N/A] ?Level: [10:11.55] [5:2.76] [6:N/A] ?Debridement A (sq cm): [10:rea Curette] [5:Curette] [6:N/A] ?Instrument: [10:Minimum] [5:Minimum] [6:N/A] ?Bleeding: [10:Pressure] [5:Pressure] [6:N/A] ?Hemostasis A chieved: [10:7] [5:0] [6:N/A] ?Procedural Pain: [10:2] [5:0] [6:N/A] ?Post Procedural Pain: [10:Procedure was tolerated well] [5:Procedure was tolerated well] [6:N/A] ?Debridement Treatment Response: [10:3.3x3.5x0.1] [5:1.2x2.3x0.1] [6:N/A] ?Post Debridement Measurements L x ?W x D (cm) [34:9.179] [5:0.217] [6:N/A] ?Post Debridement Volume: (cm?) [10:Debridement] [5:Debridement] [6:N/A] ?Wound Number: [id number] N/A ?Photos: No Photos No Photos N/A ?Right, Medial Knee Right, Anterior Amputation Site - Below N/A ?Wound Location: ?Knee ?Blister Blister N/A ?Wounding Event: ?Diabetic  Wound/Ulcer of the Lower Diabetic Wound/Ulcer of the Lower N/A ?Primary Etiology: ?Extremity Extremity ?Cataracts, Anemia, Sleep Apnea, Cataracts, Anemia, Sleep Apnea, N/A ?Comorbid History: ?Arrhythmia, Congestive Heart Failure,

## 2021-12-26 NOTE — Progress Notes (Signed)
MILLIE, SHORB ([id number]) ?Visit Report for 12/26/2021 ?Chief Complaint Document Details ?Patient Name: Date of Service: ?[name] MES L. 12/26/2021 10:00 A M ?Medical Record Number: [id number] ?Patient Account Number: [id number] ?Date of Birth/Sex: Treating RN: ?Mar 06, 1971 (51 y.o. [name] ?Primary Care Provider: [name] Other Clinician: ?Referring Provider: ?Treating Provider/Extender: Fredirick Maudlin ?[name] K ?Weeks in Treatment: 4 ?Information Obtained from: Patient ?Chief Complaint ?Patients presents for treatment of an open diabetic ulcer ?Electronic Signature(s) ?Signed: 12/26/2021 10:55:44 AM By: Fredirick Maudlin MD FACS ?Entered By: Fredirick Maudlin on 12/26/2021 10:55:43 ?-------------------------------------------------------------------------------- ?Debridement Details ?Patient Name: Date of Service: ?[name] MES L. 12/26/2021 10:00 A M ?Medical Record Number: [id number] ?Patient Account Number: [id number] ?Date of Birth/Sex: Treating RN: ?[birth year] (51 y.o. Janyth Contes ?Primary Care Provider: [name] Other Clinician: ?Referring Provider: ?Treating Provider/Extender: Fredirick Maudlin ?[name] K ?Weeks in Treatment: 4 ?Debridement Performed for Assessment: Wound #5 Left,Distal,Anterior Lower Leg ?Performed By: Physician Fredirick Maudlin, MD ?Debridement Type: Debridement ?Severity of Tissue Pre Debridement: Fat layer exposed ?Level of Consciousness (Pre-procedure): Awake and Alert ?Pre-procedure Verification/Time Out Yes - 10:37 ?Taken: ?Start Time: 10:37 ?Pain Control: ?Other : benzocaine 20% ?T Area Debrided (L x W): ?otal 1.2 (cm) x 2.3 (cm) = 2.76 (cm?) ?Tissue and other material debrided: Viable, Non-Viable, Slough, Subcutaneous, Slough ?Level: Skin/Subcutaneous Tissue ?Debridement Description: Excisional ?Instrument: Curette ?Bleeding: Minimum ?Hemostasis Achieved: Pressure ?Procedural Pain: 0 ?Post Procedural Pain: 0 ?Response to Treatment:  Procedure was tolerated well ?Level of Consciousness (Post- Awake and Alert ?procedure): ?Post Debridement Measurements of Total Wound ?Length: (cm) 1.2 ?Width: (cm) 2.3 ?Depth: (cm) 0.1 ?Volume: (cm?) 0.217 ?Character of Wound/Ulcer Post Debridement: Improved ?Severity of Tissue Post Debridement: Fat layer exposed ?Post Procedure Diagnosis ?Same as Pre-procedure ?Electronic Signature(s) ?Signed: 12/26/2021 2:22:22 PM By: Fredirick Maudlin MD FACS ?Signed: 12/26/2021 5:07:23 PM By: Adline Peals ?Entered By: Adline Peals on 12/26/2021 10:38:52 ?-------------------------------------------------------------------------------- ?Debridement Details ?Patient Name: ?Date of Service: ?[name] MES L. 12/26/2021 10:00 A M ?Medical Record Number: [id number] ?Patient Account Number: [id number] ?Date of Birth/Sex: ?Treating RN: ?1970/09/21 (51 y.o. Janyth Contes ?Primary Care Provider: [name] ?Other Clinician: ?Referring Provider: ?Treating Provider/Extender: Fredirick Maudlin ?[name] K ?Weeks in Treatment: 4 ?Debridement Performed for Assessment: Wound #9 Right,Anterior Amputation Site - Below Knee ?Performed By: Physician Fredirick Maudlin, MD ?Debridement Type: Debridement ?Severity of Tissue Pre Debridement: Fat layer exposed ?Level of Consciousness (Pre-procedure): Awake and Alert ?Pre-procedure Verification/Time Out Yes - 10:37 ?Taken: ?Start Time: 10:37 ?Pain Control: ?Other : benzocaine 20% ?T Area Debrided (L x W): ?otal 6 (cm) x 7.5 (cm) = 45 (cm?) ?Tissue and other material debrided: ?Non-Viable, Eschar, Skin: Epidermis ?Level: Skin/Epidermis ?Debridement Description: Selective/Open Wound ?Instrument: Curette ?Bleeding: Minimum ?Hemostasis Achieved: Pressure ?Procedural Pain: 0 ?Post Procedural Pain: 0 ?Response to Treatment: Procedure was tolerated well ?Level of Consciousness (Post- Awake and Alert ?procedure): ?Post Debridement Measurements of Total Wound ?Length: (cm) 11.7 ?Width: (cm)  13 ?Depth: (cm) 0.1 ?Volume: (cm?) 11.946 ?Character of Wound/Ulcer Post Debridement: Improved ?Severity of Tissue Post Debridement: Fat layer exposed ?Post Procedure Diagnosis ?Same as Pre-procedure ?Electronic Signature(s) ?Signed: 12/26/2021 2:22:22 PM By: Fredirick Maudlin MD FACS ?Signed: 12/26/2021 5:07:23 PM By: Adline Peals ?Entered By: Adline Peals on 12/26/2021 10:41:40 ?-------------------------------------------------------------------------------- ?Debridement Details ?Patient Name: ?Date of Service: ?[name] MES L. 12/26/2021 10:00 A M ?Medical Record Number: [id number] ?Patient Account Number: [id number] ?Date of Birth/Sex: ?Treating RN: ?08-17-1971 (51 y.o. Janyth Contes ?Primary Care Provider:  [name] K ?Other Clinician: ?Referring Provider: ?Treating Provider/Extender: Fredirick Maudlin ?[name] K ?Weeks in Treatment: 4 ?Debridement Performed for Assessment: Wound #10 Right,Lateral Lower Leg ?Performed By: Physician Fredirick Maudlin, MD ?Debridement Type: Debridement ?Severity of Tissue Pre Debridement: Fat layer exposed ?Level of Consciousness (Pre-procedure): Awake and Alert ?Pre-procedure Verification/Time Out Yes - 10:37 ?Taken: ?Start Time: 10:37 ?Pain Control: ?Other : benzocaine 20% ?T Area Debrided (L x W): ?otal 3.3 (cm) x 3.5 (cm) = 11.55 (cm?) ?Tissue and other material debrided: Non-Viable, Eschar ?Level: Non-Viable Tissue ?Debridement Description: Selective/Open Wound ?Instrument: Curette ?Bleeding: Minimum ?Hemostasis Achieved: Pressure ?Procedural Pain: 7 ?Post Procedural Pain: 2 ?Response to Treatment: Procedure was tolerated well ?Level of Consciousness (Post- Awake and Alert ?procedure): ?Post Debridement Measurements of Total Wound ?Length: (cm) 3.3 ?Width: (cm) 3.5 ?Depth: (cm) 0.1 ?Volume: (cm?) 0.907 ?Character of Wound/Ulcer Post Debridement: Improved ?Severity of Tissue Post Debridement: Fat layer exposed ?Post Procedure Diagnosis ?Same as  Pre-procedure ?Electronic Signature(s) ?Signed: 12/26/2021 2:22:22 PM By: Fredirick Maudlin MD FACS ?Signed: 12/26/2021 5:07:23 PM By: Adline Peals ?Entered By: Adline Peals on 12/26/2021 10:44:45 ?-------------------------------------------------------------------------------- ?Debridement Details ?Patient Name: ?Date of Service: ?[name] MES L. 12/26/2021 10:00 A M ?Medical Record Number: [id number] ?Patient Account Number: [id number] ?Date of Birth/Sex: ?Treating RN: ?[birth year] (51 y.o. Janyth Contes ?Primary Care Provider: [name] ?Other Clinician: ?Referring Provider: ?Treating Provider/Extender: Fredirick Maudlin ?[name] K ?Weeks in Treatment: 4 ?Debridement Performed for Assessment: Wound #7 Right,Medial Knee ?Performed By: Physician Fredirick Maudlin, MD ?Debridement Type: Debridement ?Severity of Tissue Pre Debridement: Fat layer exposed ?Level of Consciousness (Pre-procedure): Awake and Alert ?Pre-procedure Verification/Time Out Yes - 10:37 ?Taken: ?Start Time: 10:37 ?Pain Control: ?Other : benzocaine 20% ?T Area Debrided (L x W): ?otal 13 (cm) x 8.3 (cm) = 107.9 (cm?) ?Tissue and other material debrided: Non-Viable, Eschar ?Level: Non-Viable Tissue ?Debridement Description: Selective/Open Wound ?Instrument: Curette ?Bleeding: Minimum ?Hemostasis Achieved: Pressure ?Procedural Pain: 7 ?Post Procedural Pain: 2 ?Response to Treatment: Procedure was tolerated well ?Level of Consciousness (Post- Awake and Alert ?procedure): ?Post Debridement Measurements of Total Wound ?Length: (cm) 13 ?Width: (cm) 8.3 ?Depth: (cm) 0.1 ?Volume: (cm?) 8.474 ?Character of Wound/Ulcer Post Debridement: Improved ?Severity of Tissue Post Debridement: Fat layer exposed ?Post Procedure Diagnosis ?Same as Pre-procedure ?Electronic Signature(s) ?Signed: 12/26/2021 2:22:22 PM By: Fredirick Maudlin MD FACS ?Signed: 12/26/2021 5:07:23 PM By: Adline Peals ?Entered By: Adline Peals on 12/26/2021  10:45:51 ?-------------------------------------------------------------------------------- ?HPI Details ?Patient Name: Date of Service: ?[name] MES L. 12/26/2021 10:00 A M ?Medical Record Number: [id number] ?Patient [facility]

## 2021-12-26 NOTE — Progress Notes (Signed)
? ?Patient ID: Edward [name], male   DOB: [birth year], 51 y.o.   MRN: [id number] ? ?Reason for Consult: No chief complaint on file. ?  ?Referred by [name], MD ? ?Subjective:  ?   ?HPI: ? ?Edward [name] is a 51 y.o. male with end-stage renal disease on dialysis via left arm fistula which has been revised in the past.  He has recently undergone left index finger amputation also has a ring finger amputation this past.  On the right side he now has gangrenous changes to the right index and middle finger.  For duplex today.  He has a wound in the left leg which she is states is healing he also has right below-knee amputation site wound which is followed at the wound care center.  Previously underwent left lower extremity intervention.  He is on aspirin, statin and Plavix.  He dialyzes on Tuesdays, Thursdays and Saturdays. ? ?Past Medical History:  ?Diagnosis Date  ? Allergy   ? Anemia   ? getting Venofer with dialysis  ? Anxiety   ? self reported, sometimes-situational anxiety  ? Arthritis   ? bilateral hands/LEFT hip  ? CHF (congestive heart failure) ([place])   ? Chronic kidney disease   ? t th s dialysis- [place] ---ESRD  ? Complication of anesthesia   ? slow to go to sleep  ? Coronary artery disease   ? Depression   ? self reported, sometimes-situational depression  ? Diabetes mellitus   ? type II- on meds  ? Dyspnea   ? sometimes- fluid  ? GERD (gastroesophageal reflux disease)   ? on meds  ? Hypertension   ? no longer on medication since starting on Hemodialysis  ? Myocardial infarction [facility]) 2019  ? Sleep apnea   ? ?Family History  ?Problem Relation Age of Onset  ? Diabetes Other   ? Pancreatic cancer Mother 73  ? Colon cancer Neg Hx   ? Colon polyps Neg Hx   ? Esophageal cancer Neg Hx   ? Rectal cancer Neg Hx   ? Stomach cancer Neg Hx   ? ?Past Surgical History:  ?Procedure Laterality Date  ? A/V FISTULAGRAM Left 03/06/2020  ? Procedure: A/V FISTULAGRAM;  Surgeon: Waynetta Sandy, MD;  Location: [place] [facility];  Service: Cardiovascular;  Laterality: Left;  ? ABDOMINAL AORTOGRAM W/LOWER EXTREMITY N/A 04/09/2021  ? Procedure: ABDOMINAL AORTOGRAM W/LOWER EXTREMITY;  Surgeon: Waynetta Sandy, MD;  Location: [place] [facility];  Service: Cardiovascular;  Laterality: N/A;  ? AMPUTATION Left 05/10/2016  ? Procedure: Left Transmetatarsal Amputation;  Surgeon: [name], MD;  Location: [place];  Service: Orthopedics;  Laterality: Left;  ? AMPUTATION Right 05/16/2018  ? Procedure: PARTIAL RAY AMPUTATION FIFTH TOE RIGHT FOOT;  Surgeon: [name], DPM;  Location: [place];  Service: Podiatry;  Laterality: Right;  ? AMPUTATION Right 06/17/2018  ? Procedure: AMPUTATION 4th RAY Right Foot;  Surgeon: Trula Slade, DPM;  Location: [place];  Service: Podiatry;  Laterality: Right;  ? AMPUTATION Right 06/19/2018  ? Procedure: RIGHT BELOW KNEE AMPUTATION;  Surgeon: [name], MD;  Location: [place];  Service: Orthopedics;  Laterality: Right;  ? AMPUTATION Left 10/05/2021  ? Procedure: LEFT RING FINGER AMPUTATION;  Surgeon: Daryll Brod, MD;  Location: [place];  Service: Orthopedics;  Laterality: Left;  45 MIN  ? AMPUTATION Left 11/15/2021  ? Procedure: LEFT INDEX FINGER AMPUTATION;  Surgeon: Leanora Cover, MD;  Location: [place];  Service:  Orthopedics;  Laterality: Left;  60 MIN  ? APPLICATION OF WOUND VAC Right 06/19/2018  ? Procedure: APPLICATION OF WOUND VAC;  Surgeon: [name], MD;  Location: [place];  Service: Orthopedics;  Laterality: Right;  ? AV FISTULA PLACEMENT Left 11/25/2019  ? Procedure: LEFT ARM Brachiocephalic  ARTERIOVENOUS (AV) FISTULA CREATION;  Surgeon: Rosetta Posner, MD;  Location: [place];  Service: Vascular;  Laterality: Left;  ? BASCILIC VEIN TRANSPOSITION Left 07/19/2020  ? Procedure: LEFT SECOND STAGE BASCILIC VEIN TRANSPOSITION;  Surgeon: Waynetta Sandy, MD;  Location: [place];  Service: Vascular;  Laterality: Left;  ? CARDIAC CATHETERIZATION  07/29/2018  ? CIRCUMCISION  09/02/2011  ?  Procedure: CIRCUMCISION ADULT;  Surgeon: Molli Hazard, MD;  Location: [place];  Service: Urology;  Laterality: N/A;  ? CORONARY STENT INTERVENTION N/A 07/29/2018  ? Procedure: CORONARY STENT INTERVENTION;  Surgeon: Leonie Man, MD;  Location: [place] [facility];  Service: Cardiovascular;  Laterality: N/A;  ? DEBRIDEMENT AND CLOSURE WOUND Left 11/15/2021  ? Procedure: LEFT HAND WOUND CLOSURE;  Surgeon: Leanora Cover, MD;  Location: [place];  Service: Orthopedics;  Laterality: Left;  ? I & D EXTREMITY  09/02/2011  ? Procedure: IRRIGATION AND DEBRIDEMENT EXTREMITY;  Surgeon: Theodoro Kos, DO;  Location: [place];  Service: Plastics;  Laterality: N/A;  ? INCISION AND DRAINAGE OF WOUND  08/12/2011  ? Procedure: IRRIGATION AND DEBRIDEMENT WOUND;  Surgeon: Zenovia Jarred, MD;  Location: [place];  Service: General;;  perineum  ? INSERTION OF DIALYSIS CATHETER N/A 11/25/2019  ? Procedure: INSERTION OF [place] Internal Jugular DIALYSIS CATHETER;  Surgeon: Rosetta Posner, MD;  Location: [place];  Service: Vascular;  Laterality: N/A;  ? Edward [name]  08/12/2011  ? Procedure: IRRIGATION AND DEBRIDEMENT ABSCESS;  Surgeon: Molli Hazard, MD;  Location: [place];  Service: Urology;  Laterality: N/A;  irrigation and debridment perineum    ? IRRIGATION AND DEBRIDEMENT ABSCESS  08/14/2011  ? Procedure: MINOR INCISION AND DRAINAGE OF ABSCESS;  Surgeon: Molli Hazard, MD;  Location: [place];  Service: Urology;  Laterality: N/A;  Perineal Wound Debridement;Placement Bilateral Testicular Thigh Pouches  ? LEFT HEART CATH AND CORONARY ANGIOGRAPHY N/A 07/29/2018  ? Procedure: LEFT HEART CATH AND CORONARY ANGIOGRAPHY;  Surgeon: Leonie Man, MD;  Location: [place] [facility];  Service: Cardiovascular;  Laterality: N/A;  ? PERIPHERAL VASCULAR ATHERECTOMY Left 04/09/2021  ? Procedure: PERIPHERAL VASCULAR ATHERECTOMY;  Surgeon: Waynetta Sandy, MD;  Location: [place] [facility];  Service: Cardiovascular;  Laterality: Left;  TP trunk and peroneal arteries  ? PERIPHERAL VASCULAR BALLOON ANGIOPLASTY Left 04/09/2021  ? Procedure: PERIPHERAL VASCULAR BALLOON ANGIOPLASTY;  Surgeon: Waynetta Sandy, MD;  Location: [place] [facility];  Service: Cardiovascular;  Laterality: Left;  Popliteal  ? REVISON OF ARTERIOVENOUS FISTULA Left 05/24/2020  ? Procedure: LEFT ARM ARTERIOVENOUS FISTULA  CONVERSION TO BASILIC VEIN FISTULA;  Surgeon: Waynetta Sandy, MD;  Location: [place];  Service: Vascular;  Laterality: Left;  ? TOTAL HIP ARTHROPLASTY Left 10/11/2016  ? Procedure: LEFT TOTAL HIP ARTHROPLASTY ANTERIOR APPROACH;  Surgeon: Mcarthur Rossetti, MD;  Location: [facility];  Service: Orthopedics;  Laterality: Left;  ? ? ?Short Social History:  ?Social History  ? ?Tobacco Use  ? Smoking status: Former  ?  Packs/day: 0.25  ?  Years: 24.00  ?  Pack years: 6.00  ?  Types: Cigarettes  ?  Quit date: 11/19/2019  ?  Years since quitting: 2.1  ? Smokeless tobacco: Never  ?Substance Use Topics  ? Alcohol use: Not Currently  ?  Alcohol/week: 0.0 standard drinks  ?  Comment: quit 2018  ? ? ?Allergies  ?Allergen Reactions  ? Crestor [Rosuvastatin] Rash  ?  Also had a red eye  ? Dilaudid [Hydromorphone] Itching  ? Benadryl [Diphenhydramine] Rash  ? ? ?Current Outpatient Medications  ?Medication Sig Dispense Refill  ? acetaminophen (TYLENOL) 500 MG tablet Take 500-1,000 mg by mouth every 6 (six) hours as needed for moderate pain or headache.    ? aspirin EC 81 MG tablet Take 1 tablet (81 mg total) by mouth daily. Swallow whole. 90 tablet 3  ? atorvastatin (LIPITOR) 80 MG tablet Take 1 tablet (80 mg total) by mouth daily. (Patient taking differently: Take 80 mg by mouth at bedtime.) 90 tablet 3  ? bismuth subsalicylate (PEPTO BISMOL) 262 MG/15ML suspension Take 30 mLs by mouth every 6 (six) hours as needed for indigestion.    ? blood glucose meter kit and supplies Dispense based on patient  and insurance preference. Use up to four times daily as directed. (FOR ICD-9 250.00, 250.01). 1 each 0  ? clopidogrel (PLAVIX) 75 MG tablet Take 1 tablet (75 mg total) by mouth daily. (Patient not taking: Repor

## 2022-01-01 ENCOUNTER — Encounter (HOSPITAL_BASED_OUTPATIENT_CLINIC_OR_DEPARTMENT_OTHER): Payer: Medicare Other | Admitting: General Surgery

## 2022-01-01 ENCOUNTER — Other Ambulatory Visit: Payer: Self-pay

## 2022-01-01 DIAGNOSIS — L97822 Non-pressure chronic ulcer of other part of left lower leg with fat layer exposed: Secondary | ICD-10-CM | POA: Diagnosis not present

## 2022-01-01 DIAGNOSIS — I739 Peripheral vascular disease, unspecified: Secondary | ICD-10-CM

## 2022-01-01 DIAGNOSIS — N186 End stage renal disease: Secondary | ICD-10-CM

## 2022-01-01 DIAGNOSIS — I96 Gangrene, not elsewhere classified: Secondary | ICD-10-CM

## 2022-01-02 ENCOUNTER — Encounter (HOSPITAL_BASED_OUTPATIENT_CLINIC_OR_DEPARTMENT_OTHER): Payer: Medicare Other | Admitting: General Surgery

## 2022-01-03 NOTE — Progress Notes (Signed)
Edward [name], CICERO ([id number]) Visit Report for 01/01/2022 Chief Complaint Document Details Patient Name: Date of Service: Edward [name] MES L. 01/01/2022 2:00 PM Medical Record Number: [id number] Patient Account Number: [id number] Date of Birth/Sex: Treating RN: 1971/07/09 (51 y.o. Edward [name] Primary Care Provider: [name] Other Clinician: Referring Provider: Treating Provider/Extender: Kevan Ny in Treatment: 4 Information Obtained from: Patient Chief Complaint Patients presents for treatment of an open diabetic ulcer Electronic Signature(s) Signed: 01/01/2022 4:55:43 PM By: Fredirick Maudlin MD FACS Entered By: Fredirick Maudlin on 01/01/2022 16:55:43 -------------------------------------------------------------------------------- Debridement Details Patient Name: Date of Service: Edward [name], [name] 01/01/2022 2:00 PM Medical Record Number: [id number] Patient Account Number: [id number] Date of Birth/Sex: Treating RN: 07-20-1971 (51 y.o. Edward [name] Primary Care Provider: [name] Other Clinician: Referring Provider: Treating Provider/Extender: Kevan Ny in Treatment: 4 Debridement Performed for Assessment: Wound #7 Right,Medial Knee Performed By: Physician Fredirick Maudlin, MD Debridement Type: Debridement Severity of Tissue Pre Debridement: Fat layer exposed Level of Consciousness (Pre-procedure): Awake and Alert Pre-procedure Verification/Time Out Yes - 15:01 Taken: Start Time: 15:01 Pain Control: Other : benzocaine 20% T Area Debrided (L x W): otal 11 (cm) x 7 (cm) = 77 (cm) Tissue and other material debrided: Non-Viable, Slough, Slough Level: Non-Viable Tissue Debridement Description: Selective/Open Wound Instrument: Curette Bleeding: Minimum Hemostasis Achieved: Pressure Procedural Pain: 0 Post Procedural Pain: 0 Response to Treatment: Procedure was tolerated well Level of Consciousness  (Post- Awake and Alert procedure): Post Debridement Measurements of Total Wound Length: (cm) 13.7 Width: (cm) 8.4 Depth: (cm) 0.1 Volume: (cm) 9.038 Character of Wound/Ulcer Post Debridement: Improved Severity of Tissue Post Debridement: Fat layer exposed Post Procedure Diagnosis Same as Pre-procedure Electronic Signature(s) Signed: 01/01/2022 5:05:48 PM By: Fredirick Maudlin MD FACS Signed: 01/02/2022 6:28:42 PM By: Baruch Gouty RN, BSN Signed: 01/03/2022 12:04:25 PM By: Donavan Burnet CHT EMT BS , , Entered By: Donavan Burnet on 01/01/2022 15:12:18 -------------------------------------------------------------------------------- Debridement Details Patient Name: Date of Service: Edward [name], Edward [name] MES L. 01/01/2022 2:00 PM Medical Record Number: [id number] Patient Account Number: [id number] Date of Birth/Sex: Treating RN: August 04, 1971 (51 y.o. Edward [name] Primary Care Provider: [name] Other Clinician: Referring Provider: Treating Provider/Extender: Kevan Ny in Treatment: 4 Debridement Performed for Assessment: Wound #9 Right,Anterior Amputation Site - Below Knee Performed By: Physician Fredirick Maudlin, MD Debridement Type: Debridement Severity of Tissue Pre Debridement: Fat layer exposed Level of Consciousness (Pre-procedure): Awake and Alert Pre-procedure Verification/Time Out Yes - 15:01 Taken: Start Time: 15:01 Pain Control: Other : benzocaine 20% T Area Debrided (L x W): otal 13 (cm) x 2 (cm) = 26 (cm) Tissue and other material debrided: Non-Viable, Eschar, Slough, Slough Level: Non-Viable Tissue Debridement Description: Selective/Open Wound Instrument: Curette Bleeding: Minimum Hemostasis Achieved: Pressure Procedural Pain: 0 Post Procedural Pain: 0 Response to Treatment: Procedure was tolerated well Level of Consciousness (Post- Awake and Alert procedure): Post Debridement Measurements of Total Wound Length: (cm)  12.5 Width: (cm) 10.8 Depth: (cm) 0.1 Volume: (cm) 10.603 Character of Wound/Ulcer Post Debridement: Improved Severity of Tissue Post Debridement: Fat layer exposed Post Procedure Diagnosis Same as Pre-procedure Electronic Signature(s) Signed: 01/01/2022 5:05:48 PM By: Fredirick Maudlin MD FACS Signed: 01/02/2022 6:28:42 PM By: Baruch Gouty RN, BSN Signed: 01/03/2022 12:04:25 PM By: Donavan Burnet CHT EMT BS , , Entered By: Donavan Burnet on 01/01/2022 15:14:20 -------------------------------------------------------------------------------- Debridement Details Patient Name: Date of Service: Edward [name], [name] 01/01/2022 2:00  PM Medical Record Number: [id number] Patient Account Number: [id number] Date of Birth/Sex: Treating RN: 1971/03/13 (51 y.o. Edward [name] Primary Care Provider: [name] Other Clinician: Referring Provider: Treating Provider/Extender: Kevan Ny in Treatment: 4 Debridement Performed for Assessment: Wound #10 Right,Lateral Lower Leg Performed By: Physician Fredirick Maudlin, MD Debridement Type: Debridement Severity of Tissue Pre Debridement: Fat layer exposed Level of Consciousness (Pre-procedure): Awake and Alert Pre-procedure Verification/Time Out Yes - 15:01 Taken: Start Time: 15:01 Pain Control: Other : benzocaine 20% T Area Debrided (L x W): otal 3 (cm) x 3.5 (cm) = 10.5 (cm) Tissue and other material debrided: Non-Viable, Slough, Slough Level: Non-Viable Tissue Debridement Description: Selective/Open Wound Instrument: Curette Bleeding: Minimum Hemostasis Achieved: Pressure Procedural Pain: 0 Post Procedural Pain: 0 Response to Treatment: Procedure was tolerated well Level of Consciousness (Post- Awake and Alert procedure): Post Debridement Measurements of Total Wound Length: (cm) 3.2 Width: (cm) 3.9 Depth: (cm) 0.1 Volume: (cm) 0.98 Character of Wound/Ulcer Post Debridement:  Improved Severity of Tissue Post Debridement: Fat layer exposed Post Procedure Diagnosis Same as Pre-procedure Electronic Signature(s) Signed: 01/01/2022 5:05:48 PM By: Fredirick Maudlin MD FACS Signed: 01/02/2022 6:28:42 PM By: Baruch Gouty RN, BSN Signed: 01/03/2022 12:04:25 PM By: Donavan Burnet CHT EMT BS , , Entered By: Donavan Burnet on 01/01/2022 15:15:26 -------------------------------------------------------------------------------- Debridement Details Patient Name: Date of Service: Edward [name], Edward [name] MES L. 01/01/2022 2:00 PM Medical Record Number: [id number] Patient Account Number: [id number] Date of Birth/Sex: Treating RN: 06/15/1971 (50 y.o. Edward [name] Primary Care Provider: [name] Other Clinician: Referring Provider: Treating Provider/Extender: Kevan Ny in Treatment: 4 Debridement Performed for Assessment: Wound #5 Left,Distal,Anterior Lower Leg Performed By: Physician Fredirick Maudlin, MD Debridement Type: Debridement Severity of Tissue Pre Debridement: Fat layer exposed Level of Consciousness (Pre-procedure): Awake and Alert Pre-procedure Verification/Time Out Yes - 15:01 Taken: Start Time: 15:01 Pain Control: Other : benzocaine 20% T Area Debrided (L x W): otal 1.1 (cm) x 2.6 (cm) = 2.86 (cm) Tissue and other material debrided: Viable, Non-Viable, Slough, Subcutaneous, Slough Level: Skin/Subcutaneous Tissue Debridement Description: Excisional Instrument: Curette Bleeding: Minimum Hemostasis Achieved: Pressure Procedural Pain: 0 Post Procedural Pain: 0 Response to Treatment: Procedure was tolerated well Level of Consciousness (Post- Awake and Alert procedure): Post Debridement Measurements of Total Wound Length: (cm) 1.1 Width: (cm) 2.6 Depth: (cm) 0.1 Volume: (cm) 0.225 Character of Wound/Ulcer Post Debridement: Improved Severity of Tissue Post Debridement: Fat layer exposed Post Procedure  Diagnosis Same as Pre-procedure Electronic Signature(s) Signed: 01/01/2022 5:05:48 PM By: Fredirick Maudlin MD FACS Signed: 01/02/2022 6:28:42 PM By: Baruch Gouty RN, BSN Signed: 01/03/2022 12:04:25 PM By: Donavan Burnet CHT EMT BS , , Entered By: Donavan Burnet on 01/01/2022 15:16:52 -------------------------------------------------------------------------------- Debridement Details Patient Name: Date of Service: Edward [name], Edward [name] MES L. 01/01/2022 2:00 PM Medical Record Number: [id number] Patient Account Number: [id number] Date of Birth/Sex: Treating RN: 1970/09/05 (51 y.o. Edward [name] Primary Care Provider: [name] Other Clinician: Referring Provider: Treating Provider/Extender: Kevan Ny in Treatment: 4 Debridement Performed for Assessment: Wound #6 Left,Medial Lower Leg Performed By: Physician Fredirick Maudlin, MD Debridement Type: Debridement Severity of Tissue Pre Debridement: Fat layer exposed Level of Consciousness (Pre-procedure): Awake and Alert Pre-procedure Verification/Time Out Yes - 15:01 Taken: Start Time: 15:01 Pain Control: Other : benzocaine 20% T Area Debrided (L x W): otal 1.1 (cm) x 0.6 (cm) = 0.66 (cm) Tissue and other material debrided: Viable, Non-Viable, Slough, Subcutaneous, [employer]  Level: Skin/Subcutaneous Tissue Debridement Description: Excisional Instrument: Curette Bleeding: Minimum Hemostasis Achieved: Pressure Procedural Pain: 0 Post Procedural Pain: 0 Response to Treatment: Procedure was tolerated well Level of Consciousness (Post- Awake and Alert procedure): Post Debridement Measurements of Total Wound Length: (cm) 1.1 Width: (cm) 0.6 Depth: (cm) 0.1 Volume: (cm) 0.052 Character of Wound/Ulcer Post Debridement: Improved Severity of Tissue Post Debridement: Fat layer exposed Post Procedure Diagnosis Same as Pre-procedure Electronic Signature(s) Signed: 01/01/2022 5:05:48 PM By: Fredirick Maudlin MD FACS Signed: 01/02/2022 6:28:42 PM By: Baruch Gouty RN, BSN Signed: 01/03/2022 12:04:25 PM By: Donavan Burnet CHT EMT BS , , Entered By: Donavan Burnet on 01/01/2022 15:17:35 -------------------------------------------------------------------------------- HPI Details Patient Name: Date of Service: Edward [name], Edward [name] MES L. 01/01/2022 2:00 PM Medical Record Number: [id number] Patient Account Number: [id number] Date of Birth/Sex: Treating RN: [birth year] (51 y.o. Edward [name] Primary Care Provider: [name] Other Clinician: Referring Provider: Treating Provider/Extender: Kevan Ny in Treatment: 4 History of Present Illness HPI Description: Admission 09/24/2021 Mr. 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 He developed cellulitis to the left fourth finger and was treated with doxycycline at urgent care. The nail is coming off. He reports taking the antibiotics currently. He has been keeping the area covered. He also presents with 2 left lower extremity wounds. He reports that the more proximal wound has been present for several months and the more distal wound has developed more recently over the past several weeks. He had an abdominal aortogram to the left lower extremity with laser atherectomy to the left popliteal, left tibioperoneal trunk and left peroneal arteries, drug-coated balloon angioplasty of the left popliteal artery and plain balloon angioplasty of the left tibioperoneal trunk and left peroneal artery. He has not followed up with vein and vascular since his procedure on 04/09/2021. He currently denies signs of infection. He currently denies pain. 11/28/2021: The patient has not been seen since his initial admission. He has not followed up with  vascular surgery, either. He did undergo amputation of his left fourth finger and is currently under the care of orthopedics and occupational therapy for this. He presents today with multiple wounds on his bilateral lower extremities. He is very fluid overloaded, based upon the appearance of his extremities. These all seem to have started as blisters that have subsequently opened and caused ulcers. He does have a shrinker sock for his right BKA, but he is unable to wear it secondary to pain. He also has a compression stocking for his left lower extremity but he has been unable to apply it due to the amputation on his left hand. The largest and most painful of these wounds is just above the knee on the right. All of the wounds have eschar and some slough present. No overt signs of infection. 12/05/2021: All of the wounds look about the same. He has new blisters that have opened up revealing denuded skin underneath. The eschar is extremely hard and where there is not eschar, the wounds are gritty. He was unable to tolerate the Ace wrap at the tightness that we applied it last week and ended up having to have it loosened. He has been tolerating Kerlix and Coban on the left leg. He has not received a follow-up appointment with vascular surgery yet. We have been using Santyl  under Hydrofera Blue. 12/12/2021: We have been unable to secure home health care assistance. The patient and his son are doing his wound care at home. He does have a follow-up visit in vascular surgery on May 24. He appears to be less fluid overloaded today. The wound on his right medial thigh looks bigger today and has a thick layer of hard dark eschar. The wound on his lateral right stump looks somewhat improved with softer eschar and slough. There is also slough on the wound overlying the end of the stump and wrapping around to the backside of it. On his left leg, the edema control is improved. The wound on his anterior tibial surface  is small and clean. On his dorsal and medial ankle, there is slough buildup. 12/19/2021: The wound on his left anterior tibial surface is closed. The wound on his dorsal left foot looks about the same with just a bit of slough buildup. The surface is gritty. The wound on his right medial thigh looks about the same today and has reaccumulated the hard dark eschar, despite vigorous debridement last week. The wound on the right lateral stump is about the same and has less eschar and slough. Overlying the end of the stump and wrapping around the backside of it, the wounds have converged. Overall, his edema control is much better than it has been and certainly than it was on the first visit. 12/26/2021: The wound on his dorsal left foot has a bit of slough accumulation but the surface is less gritty. The wound on his left medial calf is clean with a good bit of granulation tissue. The wounds on his right thigh and stump continue to display a heavy thick layer of black eschar. 01/01/2022: The wound on his dorsal left foot is fairly clean but still has a gritty surface. The wound on his left medial calf is nearly closed with just a little bit of overlying eschar. Unfortunately, the wounds on his right thigh and stump have deteriorated. 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 He has a fluctuant area near the proximal part of his medial thigh wound. There is an odor coming from that leg but I am not sure which of the wounds is producing it or if both of them are. Electronic Signature(s) Signed: 01/01/2022 4:57:17 PM By: Fredirick Maudlin MD FACS Entered By: Fredirick Maudlin on 01/01/2022 16:57:16 -------------------------------------------------------------------------------- Incision and Drainage Details Patient Name: Date of Service: Edward [name], Edward [name] MES L. 01/01/2022 2:00 PM Medical Record Number: [id number] Patient Account Number: [id number] Date of Birth/Sex:  Treating RN: 1971-03-20 (51 y.o. Edward [name] Primary Care Provider: Other Clinician: [name] Referring Provider: Treating Provider/Extender: Kevan Ny in Treatment: 4 Incision And Drainage Performed Wound #7 Right, Medial Knee for: Performed By: Physician Fredirick Maudlin, MD Incision And Drainage Type: Hematoma / Seroma Location: Rt medial knee Level of Consciousness (Pre- Awake and Alert procedure): Pre-procedure Verification/Time Out Yes - 15:01 Taken: Pain Control: Other Drainage Of: Sanguineous Instrument: Blade Bleeding: Moderate Hemostasis Achieved: Pressure Procedural Pain: 5 Post Procedural Pain: 4 Response to Treatment: Procedure was tolerated well Level of Consciousness (Post- Awake and Alert procedure): Post Procedure Diagnosis Same as Pre-procedure Electronic Signature(s) Signed: 01/01/2022 5:05:48 PM By: Fredirick Maudlin MD FACS Signed: 01/03/2022 12:04:25 PM By: Donavan Burnet CHT EMT BS , , Entered By: Donavan Burnet on 01/01/2022 15:31:59 -------------------------------------------------------------------------------- Physical Exam Details Patient Name: Date of Service: BO  WERS, [name] 01/01/2022 2:00 PM Medical Record Number: [id number] Patient Account Number: [id number] Date of Birth/Sex: Treating RN: 06-25-1971 (51 y.o. Edward [name] Primary Care Provider: [name] Other Clinician: Referring Provider: Treating Provider/Extender: Cecile Hearing Weeks in Treatment: 4 Constitutional . . . . No acute distress. Respiratory Normal work of breathing on room air. Notes 01/01/2022: The wound on his dorsal left foot is fairly clean but still has a gritty surface. The wound on his left medial calf is nearly closed with just a little bit of overlying eschar. Unfortunately, the wounds on his right thigh and stump have deteriorated. The distal portion of his BKA stump is white and cold  and appears to be completely nonviable. He has a fluctuant area near the proximal part of his medial thigh wound. Electronic Signature(s) Signed: 01/01/2022 5:00:11 PM By: Fredirick Maudlin MD FACS Entered By: Fredirick Maudlin on 01/01/2022 17:00:10 -------------------------------------------------------------------------------- Physician Orders Details Patient Name: Date of Service: Edward [name], [name] 01/01/2022 2:00 PM Medical Record Number: [id number] Patient Account Number: [id number] Date of Birth/Sex: Treating RN: [birth year] (51 y.o. Edward [name] Primary Care Provider: [name] Other Clinician: Referring Provider: Treating Provider/Extender: Kevan Ny in Treatment: 4 Verbal / Phone Orders: No Diagnosis Coding ICD-10 Coding Code Description [id number] Non-pressure chronic ulcer of other part of left lower leg with fat layer exposed E11.65 Type 2 diabetes mellitus with hyperglycemia E87.70 Fluid overload, unspecified E11.42 Type 2 diabetes mellitus with diabetic polyneuropathy N18.6 End stage renal disease I50.42 Chronic combined systolic (congestive) and diastolic (congestive) heart failure I25.10 Atherosclerotic heart disease of native coronary artery without angina pectoris I42.9 Cardiomyopathy, unspecified I73.9 Peripheral vascular disease, unspecified Z89.511 Acquired absence of right leg below knee Z89.432 Acquired absence of left foot L97.112 Non-pressure chronic ulcer of right thigh with fat layer exposed Follow-up Appointments ppointment in 1 week. - Dr. Celine Ahr Rm 1 Return A Bathing/ Shower/ Hygiene May shower with protection but do not get wound dressing(s) wet. - left leg May shower and wash wound with soap and water. - use antibacterial soap to wash legs and wounds when changing dressing. right leg Edema Control - Lymphedema / SCD / Other Elevate legs to the level of the heart or above for 30 minutes daily and/or when sitting,  a frequency of: - throughout the day Avoid standing for long periods of time. Additional Orders / Instructions Follow Nutritious Diet - Monitor/Control Blood Sugars Home Health Wound #10 Right,Lateral Lower Leg Dressing changes to be completed by [place] on Monday / Wednesday / Friday except when patient has scheduled visit at [place] Wound #5 Left,Distal,Anterior Lower Leg Dressing changes to be completed by [place] on Monday / Wednesday / Friday except when patient has scheduled visit at [place] #6 Left,Medial Lower Leg Dressing changes to be completed by [place] on Monday / Wednesday / Friday except when patient has scheduled visit at [place] #7 Right,Medial Knee Dressing changes to be completed by [place] on Monday / Wednesday / Friday except when patient has scheduled visit at [place] #9 [place] Amputation Site - Below Knee Dressing changes to be completed by [place] on Monday / Wednesday / Friday except when patient has scheduled visit at [place] #10 - Lower Leg Wound Laterality: Right, Lateral Peri-Wound Care: Sween Lotion (Moisturizing  lotion) 1 x Per Day/30 Days Discharge Instructions: Apply moisturizing lotion as directed Prim Dressing: Hydrofera Blue Ready Foam, 4x5 in (Generic) 1 x Per Day/30 Days ary Discharge Instructions: Apply to wound bed as instructed Prim Dressing: Santyl Ointment 1 x Per Day/30 Days ary Discharge Instructions: Apply nickel thick amount to wound bed as instructed Secondary Dressing: Woven Gauze Sponge, Non-Sterile 4x4 in (Generic) 1 x Per Day/30 Days Discharge Instructions: Apply over primary dressing as directed. Secured With: Elastic Bandage 4 inch (ACE bandage) (Generic) 1 x Per Day/30 Days Discharge Instructions: Secure with ACE bandage as directed. Secured With: [employer], 4.5x3.1 (in/yd) (Generic) 1 x Per Day/30 Days Discharge Instructions: Secure with Kerlix as directed. Wound #5 - Lower Leg Wound Laterality: Left, Anterior, Distal Peri-Wound Care: Sween Lotion (Moisturizing lotion) 3 x Per Week/30 Days Discharge Instructions: Apply moisturizing lotion as directed Prim Dressing: Hydrofera Blue Ready Foam, 2.5 x2.5 in (Generic) 3 x Per Week/30 Days ary Discharge Instructions: Apply to wound bed as instructed Prim Dressing: Santyl Ointment 3 x Per Week/30 Days ary Discharge Instructions: Apply nickel thick amount to wound bed as instructed Secondary Dressing: Woven Gauze Sponge, Non-Sterile 4x4 in (Generic) 3 x Per Week/30 Days Discharge Instructions: Apply over primary dressing as directed. Compression Wrap: Kerlix Roll 4.5x3.1 (in/yd) (Generic) 3 x Per Week/30 Days Discharge Instructions: Apply Kerlix and Coban compression as directed. Compression Wrap: Coban Self-Adherent Wrap 4x5 (in/yd) (Generic) 3 x Per Week/30 Days Discharge Instructions: Apply over Kerlix as directed. Wound #6 - Lower Leg Wound Laterality: Left, Medial Peri-Wound Care: Sween Lotion (Moisturizing lotion) 3 x Per Week/30 Days Discharge Instructions: Apply moisturizing lotion as directed Prim Dressing: Hydrofera Blue Ready Foam, 2.5 x2.5 in (Generic) 3 x Per Week/30 Days ary Discharge Instructions: Apply to wound bed as instructed Secondary Dressing: Woven Gauze Sponge, Non-Sterile 4x4 in (Generic) 3 x Per Week/30 Days Discharge Instructions: Apply over primary dressing as directed. Compression Wrap: Kerlix Roll 4.5x3.1 (in/yd) 3 x Per Week/30 Days Discharge Instructions: Apply Kerlix and Coban compression as directed. Compression Wrap: Coban Self-Adherent Wrap 4x5 (in/yd) 3 x Per Week/30 Days Discharge Instructions: Apply over Kerlix as directed. Wound #7 - Knee Wound Laterality: Right, Medial Peri-Wound Care: Sween Lotion (Moisturizing lotion) 1 x Per Day/30  Days Discharge Instructions: Apply moisturizing lotion as directed Prim Dressing: Hydrofera Blue Ready Foam, 4x5 in (Generic) 1 x Per Day/30 Days ary Discharge Instructions: Apply to wound bed as instructed Prim Dressing: Santyl Ointment 1 x Per Day/30 Days ary Discharge Instructions: Apply nickel thick amount to wound bed as instructed Secondary Dressing: Woven Gauze Sponge, Non-Sterile 4x4 in (Generic) 1 x Per Day/30 Days Discharge Instructions: Apply over primary dressing as directed. Secured With: Elastic Bandage 4 inch (ACE bandage) (Generic) 1 x Per Day/30 Days Discharge Instructions: Secure with ACE bandage as directed. Secured With: [employer], 4.5x3.1 (in/yd) (Generic) 1 x Per Day/30 Days Discharge Instructions: Secure with Kerlix as directed. Wound #9 - Amputation Site - Below Knee Wound Laterality: Right, Anterior Peri-Wound Care: Sween Lotion (Moisturizing lotion) 1 x Per Day/30 Days Discharge Instructions: Apply moisturizing lotion as directed Prim Dressing: Hydrofera Blue Ready Foam, 4x5 in (Generic) 1 x Per Day/30 Days ary Discharge Instructions: Apply to wound bed as instructed Prim Dressing: Santyl Ointment 1 x Per Day/30 Days ary Discharge Instructions: Apply nickel thick amount to wound bed as instructed Secondary Dressing: Woven Gauze Sponge, Non-Sterile 4x4 in (Generic) 1 x Per Day/30 Days Discharge Instructions: Apply over primary dressing as  directed. Secured With: Elastic Bandage 4 inch (ACE bandage) (Generic) 1 x Per Day/30 Days Discharge Instructions: Secure with ACE bandage as directed. Secured With: [employer], 4.5x3.1 (in/yd) (Generic) 1 x Per Day/30 Days Discharge Instructions: Secure with Kerlix as directed. Electronic Signature(s) Signed: 01/01/2022 5:05:48 PM By: Fredirick Maudlin MD FACS Entered By: Fredirick Maudlin on 01/01/2022 17:01:00 -------------------------------------------------------------------------------- Problem List  Details Patient Name: Date of Service: Edward [name], [name] 01/01/2022 2:00 PM Medical Record Number: [id number] Patient Account Number: [id number] Date of Birth/Sex: Treating RN: 09-24-1970 (51 y.o. Edward [name] Primary Care Provider: [name] Other Clinician: Referring Provider: Treating Provider/Extender: Kevan Ny in Treatment: 4 Active Problems ICD-10 Encounter Code Description Active Date MDM Diagnosis [phone] Non-pressure chronic ulcer of other part of left lower leg with fat layer exposed 11/28/2021 No Yes E11.65 Type 2 diabetes mellitus with hyperglycemia 11/28/2021 No Yes E87.70 Fluid overload, unspecified 11/28/2021 No Yes E11.42 Type 2 diabetes mellitus with diabetic polyneuropathy 11/28/2021 No Yes N18.6 End stage renal disease 11/28/2021 No Yes I50.42 Chronic combined systolic (congestive) and diastolic (congestive) heart failure 11/28/2021 No Yes I25.10 Atherosclerotic heart disease of native coronary artery without angina pectoris 11/28/2021 No Yes I42.9 Cardiomyopathy, unspecified 11/28/2021 No Yes I73.9 Peripheral vascular disease, unspecified 11/28/2021 No Yes Z89.511 Acquired absence of right leg below knee 11/28/2021 No Yes Z89.432 Acquired absence of left foot 11/28/2021 No Yes L97.112 Non-pressure chronic ulcer of right thigh with fat layer exposed 12/12/2021 No Yes Inactive Problems Resolved Problems Electronic Signature(s) Signed: 01/01/2022 4:55:08 PM By: Fredirick Maudlin MD FACS Entered By: Fredirick Maudlin on 01/01/2022 16:55:07 -------------------------------------------------------------------------------- Progress Note Details Patient Name: Date of Service: Edward [name], [name] 01/01/2022 2:00 PM Medical Record Number: [id number] Patient Account Number: [id number] Date of Birth/Sex: Treating RN: [birth year] (51 y.o. Edward [name] Primary Care Provider: [name] Other Clinician: Referring Provider: Treating  Provider/Extender: Kevan Ny in Treatment: 4 Subjective Chief Complaint Information obtained from Patient Patients presents for treatment of an open diabetic ulcer History of Present Illness (HPI) Admission 09/24/2021 Mr. 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 He developed cellulitis to the left fourth finger and was treated with doxycycline at urgent care. The nail is coming off. He reports taking the antibiotics currently. He has been keeping the area covered. He also presents with 2 left lower extremity wounds. He reports that the more proximal wound has been present for several months and the more distal wound has developed more recently over the past several weeks. He had an abdominal aortogram to the left lower extremity with laser atherectomy to the left popliteal, left tibioperoneal trunk and left peroneal arteries, drug-coated balloon angioplasty of the left popliteal artery and plain balloon angioplasty of the left tibioperoneal trunk and left peroneal artery. He has not followed up with vein and vascular since his procedure on 04/09/2021. He currently denies signs of infection. He currently denies pain. 11/28/2021: The patient has not been seen since his initial admission. He has not followed up with vascular surgery, either. He did undergo amputation of his left fourth finger and is currently under the care of orthopedics and occupational therapy for this. He presents today with multiple wounds on his bilateral lower extremities. He is very fluid overloaded, based upon the appearance of  his extremities. These all seem to have started as blisters that have subsequently opened and caused ulcers. He does have a shrinker sock for his right BKA, but he is unable to wear it  secondary to pain. He also has a compression stocking for his left lower extremity but he has been unable to apply it due to the amputation on his left hand. The largest and most painful of these wounds is just above the knee on the right. All of the wounds have eschar and some slough present. No overt signs of infection. 12/05/2021: All of the wounds look about the same. He has new blisters that have opened up revealing denuded skin underneath. The eschar is extremely hard and where there is not eschar, the wounds are gritty. He was unable to tolerate the Ace wrap at the tightness that we applied it last week and ended up having to have it loosened. He has been tolerating Kerlix and Coban on the left leg. He has not received a follow-up appointment with vascular surgery yet. We have been using Santyl under Hydrofera Blue. 12/12/2021: We have been unable to secure home health care assistance. The patient and his son are doing his wound care at home. He does have a follow-up visit in vascular surgery on May 24. He appears to be less fluid overloaded today. The wound on his right medial thigh looks bigger today and has a thick layer of hard dark eschar. The wound on his lateral right stump looks somewhat improved with softer eschar and slough. There is also slough on the wound overlying the end of the stump and wrapping around to the backside of it. On his left leg, the edema control is improved. The wound on his anterior tibial surface is small and clean. On his dorsal and medial ankle, there is slough buildup. 12/19/2021: The wound on his left anterior tibial surface is closed. The wound on his dorsal left foot looks about the same with just a bit of slough buildup. The surface is gritty. The wound on his right medial thigh looks about the same today and has reaccumulated the hard dark eschar, despite vigorous debridement last week. The wound on the right lateral stump is about the same and has less  eschar and slough. Overlying the end of the stump and wrapping around the backside of it, the wounds have converged. Overall, his edema control is much better than it has been and certainly than it was on the first visit. 12/26/2021: The wound on his dorsal left foot has a bit of slough accumulation but the surface is less gritty. The wound on his left medial calf is clean with a good bit of granulation tissue. The wounds on his right thigh and stump continue to display a heavy thick layer of black eschar. 01/01/2022: The wound on his dorsal left foot is fairly clean but still has a gritty surface. The wound on his left medial calf is nearly closed with just a little bit of overlying eschar. Unfortunately, the wounds on his right thigh and stump have deteriorated. 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 He has a fluctuant area near the proximal part of his medial thigh wound. There is an odor coming from that leg but I am not sure which of the wounds is producing it or if both of them are. Patient History Information obtained from Patient, Chart. Family History Cancer - Mother, Diabetes - Siblings, Heart  Disease - Mother,Father, Kidney Disease - Siblings,Father, No family history of Hereditary Spherocytosis, Hypertension, Lung Disease, Seizures, Stroke, Thyroid Problems, Tuberculosis. Social History Former smoker - Quit 2 years ago, Marital Status - Widowed, Alcohol Use - Rarely, Drug Use - No History, Caffeine Use - Moderate - coffee. Medical History Eyes Patient has history of Cataracts - Surgery Denies history of Glaucoma, Optic Neuritis Hematologic/Lymphatic Patient has history of Anemia Respiratory Patient has history of Sleep Apnea Cardiovascular Patient has history of Arrhythmia - junctional rhythm, Congestive Heart Failure, Coronary Artery Disease, Hypertension, Myocardial Infarction, Peripheral Venous Disease Endocrine Patient  has history of Type II Diabetes Denies history of Type I Diabetes Genitourinary Patient has history of End Stage Renal Disease - Dialysis Integumentary (Skin) Denies history of History of Burn Musculoskeletal Patient has history of Osteoarthritis, Osteomyelitis Denies history of Gout, Rheumatoid Arthritis Neurologic Patient has history of Neuropathy Oncologic Denies history of Received Chemotherapy, Received Radiation Psychiatric Patient has history of Confinement Anxiety Denies history of Anorexia/bulimia Hospitalization/Surgery History - amputation left index finger 11/15/21. - debridement and closure left ring finger. - peripheral vascular atherectomy 09/07/39. - left bascillic vein transposition 12/1 21. - revision of AV fistula left 05/24/20. - AV fistula placement left 11/25/19. - coronary stent intervention 12/11 19. - right BKA 06/19/18. - total left hip arthroplasty 10/11/16. - left transmet amp 05/10/16. Medical A Surgical History Notes nd Cardiovascular cardiomyopathy Neurologic TIA Objective Constitutional No acute distress. Vitals Time Taken: 2:23 PM, Height: 72 in, Weight: 230 lbs, BMI: 31.2, Temperature: 98.3 F, Pulse: 86 bpm, Respiratory Rate: 18 breaths/min, Blood Pressure: 106/64 mmHg. Respiratory Normal work of breathing on room air. General Notes: 01/01/2022: The wound on his dorsal left foot is fairly clean but still has a gritty surface. The wound on his left medial calf is nearly closed with just a little bit of overlying eschar. Unfortunately, the wounds on his right thigh and stump have deteriorated. 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 He has a fluctuant area near the proximal part of his medial thigh wound. Integumentary (Hair, Skin) Wound #10 status is Open. Original cause of wound was Blister. The date acquired was: 12/05/2021. The wound has been in treatment 3 weeks. The wound is located on the  Right,Lateral Lower Leg. The wound measures 3.2cm length x 3.9cm width x 0.1cm depth; 9.802cm^2 area and 0.98cm^3 volume. There is Fat Layer (Subcutaneous Tissue) exposed. There is no tunneling or undermining noted. There is a medium amount of serosanguineous drainage noted. The wound margin is flat and intact. There is small (1-33%) pink, pale granulation within the wound bed. There is a large (67-100%) amount of necrotic tissue within the wound bed including Adherent Slough. Wound #5 status is Open. Original cause of wound was Blister. The date acquired was: 09/19/2021. The wound has been in treatment 4 weeks. The wound is located on the [facility] Lower Leg. The wound measures 1.1cm length x 2.6cm width x 0.1cm depth; 2.246cm^2 area and 0.225cm^3 volume. There is Fat Layer (Subcutaneous Tissue) exposed. There is no tunneling or undermining noted. There is a medium amount of serous drainage noted. The wound margin is flat and intact. There is small (1-33%) red, pink granulation within the wound bed. There is a large (67-100%) amount of necrotic tissue within the wound bed including Adherent Slough. Wound #6 status is Open. Original cause of wound was Blister. The date acquired was: 09/19/2021. The wound has been in treatment 4  weeks. The wound is located on the Left,Medial Lower Leg. The wound measures 1.1cm length x 0.6cm width x 0.1cm depth; 0.518cm^2 area and 0.052cm^3 volume. There is Fat Layer (Subcutaneous Tissue) exposed. There is no tunneling or undermining noted. There is a medium amount of serous drainage noted. The wound margin is flat and intact. There is no granulation within the wound bed. There is a large (67-100%) amount of necrotic tissue within the wound bed including Eschar. Wound #7 status is Open. Original cause of wound was Blister. The date acquired was: 09/19/2021. The wound has been in treatment 4 weeks. The wound is located on the Right,Medial Knee. The wound measures  13.7cm length x 8.4cm width x 0.1cm depth; 90.384cm^2 area and 9.038cm^3 volume. There is Fat Layer (Subcutaneous Tissue) exposed. There is no tunneling or undermining noted. There is a medium amount of serous drainage noted. The wound margin is flat and intact. There is no granulation within the wound bed. There is a large (67-100%) amount of necrotic tissue within the wound bed including Eschar and Adherent Slough. Wound #9 status is Open. Original cause of wound was Blister. The date acquired was: 09/19/2021. The wound has been in treatment 4 weeks. The wound is located on the Right,Anterior Amputation Site - Below Knee. The wound measures 12.5cm length x 10.8cm width x 0.1cm depth; 106.029cm^2 area and 10.603cm^3 volume. There is Fat Layer (Subcutaneous Tissue) exposed. There is no tunneling or undermining noted. There is a large amount of serous drainage noted. The wound margin is indistinct and nonvisible. There is no granulation within the wound bed. There is a large (67-100%) amount of necrotic tissue within the wound bed including Eschar and Adherent Slough. Assessment Active Problems ICD-10 Non-pressure chronic ulcer of other part of left lower leg with fat layer exposed Type 2 diabetes mellitus with hyperglycemia Fluid overload, unspecified Type 2 diabetes mellitus with diabetic polyneuropathy End stage renal disease Chronic combined systolic (congestive) and diastolic (congestive) heart failure Atherosclerotic heart disease of native coronary artery without angina pectoris Cardiomyopathy, unspecified Peripheral vascular disease, unspecified Acquired absence of right leg below knee Acquired absence of left foot Non-pressure chronic ulcer of right thigh with fat layer exposed Procedures Wound #10 Pre-procedure diagnosis of Wound #10 is an Arterial Insufficiency Ulcer located on the Right,Lateral Lower Leg .Severity of Tissue Pre Debridement is: Fat layer exposed. There was a  Selective/Open Wound Non-Viable Tissue Debridement with a total area of 10.5 sq cm performed by Fredirick Maudlin, MD. With the following instrument(s): Curette to remove Non-Viable tissue/material. Material removed includes [facility] after achieving pain control using Other (benzocaine 20%). No specimens were taken. A time out was conducted at 15:01, prior to the start of the procedure. A Minimum amount of bleeding was controlled with Pressure. The procedure was tolerated well with a pain level of 0 throughout and a pain level of 0 following the procedure. Post Debridement Measurements: 3.2cm length x 3.9cm width x 0.1cm depth; 0.98cm^3 volume. Character of Wound/Ulcer Post Debridement is improved. Severity of Tissue Post Debridement is: Fat layer exposed. Post procedure Diagnosis Wound #10: Same as Pre-Procedure Wound #5 Pre-procedure diagnosis of Wound #5 is a Diabetic Wound/Ulcer of the Lower Extremity located on the Left,Distal,Anterior Lower Leg .Severity of Tissue Pre Debridement is: Fat layer exposed. There was a Excisional Skin/Subcutaneous Tissue Debridement with a total area of 2.86 sq cm performed by Fredirick Maudlin, MD. With the following instrument(s): Curette to remove Viable and Non-Viable tissue/material. Material removed includes Subcutaneous Tissue and  Slough and after achieving pain control using Other (benzocaine 20%). No specimens were taken. A time out was conducted at 15:01, prior to the start of the procedure. A Minimum amount of bleeding was controlled with Pressure. The procedure was tolerated well with a pain level of 0 throughout and a pain level of 0 following the procedure. Post Debridement Measurements: 1.1cm length x 2.6cm width x 0.1cm depth; 0.225cm^3 volume. Character of Wound/Ulcer Post Debridement is improved. Severity of Tissue Post Debridement is: Fat layer exposed. Post procedure Diagnosis Wound #5: Same as Pre-Procedure Wound #6 Pre-procedure diagnosis of Wound  #6 is a Diabetic Wound/Ulcer of the Lower Extremity located on the Left,Medial Lower Leg .Severity of Tissue Pre Debridement is: Fat layer exposed. There was a Excisional Skin/Subcutaneous Tissue Debridement with a total area of 0.66 sq cm performed by Fredirick Maudlin, MD. With the following instrument(s): Curette to remove Viable and Non-Viable tissue/material. Material removed includes Subcutaneous Tissue and Slough and after achieving pain control using Other (benzocaine 20%). No specimens were taken. A time out was conducted at 15:01, prior to the start of the procedure. A Minimum amount of bleeding was controlled with Pressure. The procedure was tolerated well with a pain level of 0 throughout and a pain level of 0 following the procedure. Post Debridement Measurements: 1.1cm length x 0.6cm width x 0.1cm depth; 0.052cm^3 volume. Character of Wound/Ulcer Post Debridement is improved. Severity of Tissue Post Debridement is: Fat layer exposed. Post procedure Diagnosis Wound #6: Same as Pre-Procedure Wound #7 Pre-procedure diagnosis of Wound #7 is a Diabetic Wound/Ulcer of the Lower Extremity located on the Right,Medial Knee .Severity of Tissue Pre Debridement is: Fat layer exposed. There was a Selective/Open Wound Non-Viable Tissue Debridement with a total area of 77 sq cm performed by Fredirick Maudlin, MD. With the following instrument(s): Curette to remove Non-Viable tissue/material. Material removed includes [facility] after achieving pain control using Other (benzocaine 20%). No specimens were taken. A time out was conducted at 15:01, prior to the start of the procedure. A Minimum amount of bleeding was controlled with Pressure. The procedure was tolerated well with a pain level of 0 throughout and a pain level of 0 following the procedure. Post Debridement Measurements: 13.7cm length x 8.4cm width x 0.1cm depth; 9.038cm^3 volume. Character of Wound/Ulcer Post Debridement is improved. Severity of  Tissue Post Debridement is: Fat layer exposed. Post procedure Diagnosis Wound #7: Same as Pre-Procedure Pre-procedure diagnosis of Wound #7 is a Diabetic Wound/Ulcer of the Lower Extremity located on the Right, Medial Knee . Hematoma / Seroma incision and drainage was provided by Fredirick Maudlin, MD. The skin was cleansed and prepped with anti-septic followed by pain control using Other. An incision was made in the Rt medial knee with the following instrument(s): Blade. There was an immediate release of Sanguineous fluid. A Moderate amount of bleeding was controlled with Pressure. A time out was conducted at 15:01, prior to the start of the procedure. The procedure was tolerated well with a pain level of 5 throughout and a pain level of 4 following the procedure. Post procedure Diagnosis Wound #7: Same as Pre-Procedure Wound #9 Pre-procedure diagnosis of Wound #9 is a Diabetic Wound/Ulcer of the Lower Extremity located on the Right,Anterior Amputation Site - Below Knee .Severity of Tissue Pre Debridement is: Fat layer exposed. There was a Selective/Open Wound Non-Viable Tissue Debridement with a total area of 26 sq cm performed by Fredirick Maudlin, MD. With the following instrument(s): Curette to remove Non-Viable tissue/material. Material removed  includes Eschar and Slough and after achieving pain control using Other (benzocaine 20%). No specimens were taken. A time out was conducted at 15:01, prior to the start of the procedure. A Minimum amount of bleeding was controlled with Pressure. The procedure was tolerated well with a pain level of 0 throughout and a pain level of 0 following the procedure. Post Debridement Measurements: 12.5cm length x 10.8cm width x 0.1cm depth; 10.603cm^3 volume. Character of Wound/Ulcer Post Debridement is improved. Severity of Tissue Post Debridement is: Fat layer exposed. Post procedure Diagnosis Wound #9: Same as Pre-Procedure Plan Follow-up Appointments: Return  Appointment in 1 week. - Dr. Celine Ahr Rm 1 Bathing/ Shower/ Hygiene: May shower with protection but do not get wound dressing(s) wet. - left leg May shower and wash wound with soap and water. - use antibacterial soap to wash legs and wounds when changing dressing. right leg Edema Control - Lymphedema / SCD / Other: Elevate legs to the level of the heart or above for 30 minutes daily and/or when sitting, a frequency of: - throughout the day Avoid standing for long periods of time. Additional Orders / Instructions: Follow Nutritious Diet - Monitor/Control Blood Sugars Home Health: Wound #10 Right,Lateral Lower Leg: Dressing changes to be completed by Home Health on Monday / Wednesday / Friday except when patient has scheduled visit at [place] #5 Left,Distal,Anterior Lower Leg: Dressing changes to be completed by [place] on Monday / Wednesday / Friday except when patient has scheduled visit at [place] #6 Left,Medial Lower Leg: Dressing changes to be completed by [place] on Monday / Wednesday / Friday except when patient has scheduled visit at [place] #7 Right,Medial Knee: Dressing changes to be completed by [place] on Monday / Wednesday / Friday except when patient has scheduled visit at [place] #9 [place] Amputation Site - Below Knee: Dressing changes to be completed by [place] on Monday / Wednesday / Friday except when patient has scheduled visit at [place] #10: - Lower Leg Wound Laterality: Right, Lateral Peri-Wound Care: Sween Lotion (Moisturizing lotion) 1 x Per Day/30 Days Discharge Instructions: Apply moisturizing lotion as directed Prim Dressing: Hydrofera Blue Ready Foam, 4x5 in (Generic) 1 x Per Day/30 Days ary Discharge Instructions: Apply to wound bed as instructed Prim Dressing: Santyl Ointment 1 x Per Day/30  Days ary Discharge Instructions: Apply nickel thick amount to wound bed as instructed Secondary Dressing: Woven Gauze Sponge, Non-Sterile 4x4 in (Generic) 1 x Per Day/30 Days Discharge Instructions: Apply over primary dressing as directed. Secured With: Elastic Bandage 4 inch (ACE bandage) (Generic) 1 x Per Day/30 Days Discharge Instructions: Secure with ACE bandage as directed. Secured With: [employer], 4.5x3.1 (in/yd) (Generic) 1 x Per Day/30 Days Discharge Instructions: Secure with Kerlix as directed. WOUND #5: - Lower Leg Wound Laterality: Left, Anterior, Distal Peri-Wound Care: Sween Lotion (Moisturizing lotion) 3 x Per Week/30 Days Discharge Instructions: Apply moisturizing lotion as directed Prim Dressing: Hydrofera Blue Ready Foam, 2.5 x2.5 in (Generic) 3 x Per Week/30 Days ary Discharge Instructions: Apply to wound bed as instructed Prim Dressing: Santyl Ointment 3 x Per Week/30 Days ary Discharge Instructions: Apply nickel thick amount to wound bed as instructed Secondary Dressing: Woven Gauze Sponge, Non-Sterile 4x4 in (Generic) 3 x Per Week/30 Days Discharge Instructions: Apply over primary dressing as directed. Com pression Wrap: Kerlix Roll 4.5x3.1 (in/yd) (Generic)  3 x Per Week/30 Days Discharge Instructions: Apply Kerlix and Coban compression as directed. Com pression Wrap: Coban Self-Adherent Wrap 4x5 (in/yd) (Generic) 3 x Per Week/30 Days Discharge Instructions: Apply over Kerlix as directed. WOUND #6: - Lower Leg Wound Laterality: Left, Medial Peri-Wound Care: Sween Lotion (Moisturizing lotion) 3 x Per Week/30 Days Discharge Instructions: Apply moisturizing lotion as directed Prim Dressing: Hydrofera Blue Ready Foam, 2.5 x2.5 in (Generic) 3 x Per Week/30 Days ary Discharge Instructions: Apply to wound bed as instructed Secondary Dressing: Woven Gauze Sponge, Non-Sterile 4x4 in (Generic) 3 x Per Week/30 Days Discharge Instructions: Apply over primary dressing  as directed. Com pression Wrap: Kerlix Roll 4.5x3.1 (in/yd) 3 x Per Week/30 Days Discharge Instructions: Apply Kerlix and Coban compression as directed. Com pression Wrap: Coban Self-Adherent Wrap 4x5 (in/yd) 3 x Per Week/30 Days Discharge Instructions: Apply over Kerlix as directed. WOUND #7: - Knee Wound Laterality: Right, Medial Peri-Wound Care: Sween Lotion (Moisturizing lotion) 1 x Per Day/30 Days Discharge Instructions: Apply moisturizing lotion as directed Prim Dressing: Hydrofera Blue Ready Foam, 4x5 in (Generic) 1 x Per Day/30 Days ary Discharge Instructions: Apply to wound bed as instructed Prim Dressing: Santyl Ointment 1 x Per Day/30 Days ary Discharge Instructions: Apply nickel thick amount to wound bed as instructed Secondary Dressing: Woven Gauze Sponge, Non-Sterile 4x4 in (Generic) 1 x Per Day/30 Days Discharge Instructions: Apply over primary dressing as directed. Secured With: Elastic Bandage 4 inch (ACE bandage) (Generic) 1 x Per Day/30 Days Discharge Instructions: Secure with ACE bandage as directed. Secured With: [employer], 4.5x3.1 (in/yd) (Generic) 1 x Per Day/30 Days Discharge Instructions: Secure with Kerlix as directed. WOUND #9: - Amputation Site - Below Knee Wound Laterality: Right, Anterior Peri-Wound Care: Sween Lotion (Moisturizing lotion) 1 x Per Day/30 Days Discharge Instructions: Apply moisturizing lotion as directed Prim Dressing: Hydrofera Blue Ready Foam, 4x5 in (Generic) 1 x Per Day/30 Days ary Discharge Instructions: Apply to wound bed as instructed Prim Dressing: Santyl Ointment 1 x Per Day/30 Days ary Discharge Instructions: Apply nickel thick amount to wound bed as instructed Secondary Dressing: Woven Gauze Sponge, Non-Sterile 4x4 in (Generic) 1 x Per Day/30 Days Discharge Instructions: Apply over primary dressing as directed. Secured With: Elastic Bandage 4 inch (ACE bandage) (Generic) 1 x Per Day/30 Days Discharge Instructions:  Secure with ACE bandage as directed. Secured With: [employer], 4.5x3.1 (in/yd) (Generic) 1 x Per Day/30 Days Discharge Instructions: Secure with Kerlix as directed. 01/01/2022: The wound on his dorsal left foot is fairly clean but still has a gritty surface. The wound on his left medial calf is nearly closed with just a little bit of overlying eschar. Unfortunately, the wounds on his right thigh and stump have deteriorated. 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 He has a fluctuant area near the proximal part of his medial thigh wound. There is an odor coming from that leg but I am not sure which of the wounds is producing it or if both of them are. I initially began debriding the wounds with a curette, I encountered the fluctuant area on his medial thigh wound and attempted to aspirate to see if there was any pus underneath this, but I did not have proper equipment in clinic to do this. I did make a cruciate incision over the area of fluctuance with a #15 scalpel and did not get any purulent return, just a bit of blood. I attempted to  make the distal part of his stump bleed, but it is clearly nonviable. I sent a message to Dr. Donzetta Matters regarding my findings. He is planning to see the patient on Monday and after a conversation via [facility] staff messages it sounds like he is planning to admit him after seeing him on Monday with likely AKA to follow. We will continue using Santyl and Hydrofera Blue to his left lower extremity wounds. I do not think the right leg is going to be salvageable but we will try to manage infection until he can be seen by Dr. Donzetta Matters. Electronic Signature(s) Signed: 01/01/2022 5:04:21 PM By: Fredirick Maudlin MD FACS Entered By: Fredirick Maudlin on 01/01/2022 17:04:21 -------------------------------------------------------------------------------- HxROS Details Patient Name: Date of Service: Edward [name], [name] 01/01/2022 2:00  PM Medical Record Number: [id number] Patient Account Number: [id number] Date of Birth/Sex: Treating RN: Feb 11, 1971 (51 y.o. Edward [name] Primary Care Provider: [name] Other Clinician: Referring Provider: Treating Provider/Extender: Kevan Ny in Treatment: 4 Information Obtained From Patient Chart Eyes Medical History: Positive for: Cataracts - Surgery Negative for: Glaucoma; Optic Neuritis Hematologic/Lymphatic Medical History: Positive for: Anemia Respiratory Medical History: Positive for: Sleep Apnea Cardiovascular Medical History: Positive for: Arrhythmia - junctional rhythm; Congestive Heart Failure; Coronary Artery Disease; Hypertension; Myocardial Infarction; Peripheral Venous Disease Past Medical History Notes: cardiomyopathy Endocrine Medical History: Positive for: Type II Diabetes Negative for: Type I Diabetes Time with diabetes: 2004 Treated with: Insulin Blood sugar tested every day: Yes Tested : daily Genitourinary Medical History: Positive for: End Stage Renal Disease - Dialysis Integumentary (Skin) Medical History: Negative for: History of Burn Musculoskeletal Medical History: Positive for: Osteoarthritis; Osteomyelitis Negative for: Gout; Rheumatoid Arthritis Neurologic Medical History: Positive for: Neuropathy Past Medical History Notes: TIA Oncologic Medical History: Negative for: Received Chemotherapy; Received Radiation Psychiatric Medical History: Positive for: Confinement Anxiety Negative for: Anorexia/bulimia HBO Extended History Items Eyes: Cataracts Immunizations Pneumococcal Vaccine: Received Pneumococcal Vaccination: Yes Received Pneumococcal Vaccination On or After 60th Birthday: No Implantable Devices Yes Hospitalization / Surgery History Type of Hospitalization/Surgery amputation left index finger 11/15/21 debridement and closure left ring finger peripheral vascular atherectomy  1/57/26 left bascillic vein transposition 12/1 21 revision of AV fistula left 05/24/20 AV fistula placement left 11/25/19 coronary stent intervention 12/11 19 right BKA 06/19/18 total left hip arthroplasty 10/11/16 left transmet amp 05/10/16 Family and Social History Cancer: Yes - Mother; Diabetes: Yes - Siblings; Heart Disease: Yes - Mother,Father; Hereditary Spherocytosis: No; Hypertension: No; Kidney Disease: Yes - Siblings,Father; Lung Disease: No; Seizures: No; Stroke: No; Thyroid Problems: No; Tuberculosis: No; Former smoker - Quit 2 years ago; Marital Status - Widowed; Alcohol Use: Rarely; Drug Use: No History; Caffeine Use: Moderate - coffee; Financial Concerns: No; Food, Clothing or Shelter Needs: No; Support System Lacking: No; Transportation Concerns: No Electronic Signature(s) Signed: 01/01/2022 5:05:48 PM By: Fredirick Maudlin MD FACS Signed: 01/02/2022 6:28:42 PM By: Baruch Gouty RN, BSN Signed: 01/02/2022 6:28:42 PM By: Baruch Gouty RN, BSN Entered By: Fredirick Maudlin on 01/01/2022 16:57:23 -------------------------------------------------------------------------------- SuperBill Details Patient Name: Date of Service: Edward [name], Edward [name] MES L. 01/01/2022 Medical Record Number: [id number] Patient Account Number: [id number] Date of Birth/Sex: Treating RN: [birth year] (51 y.o. Edward [name] Primary Care Provider: [name] Other Clinician: Referring Provider: Treating Provider/Extender: Kevan Ny in Treatment: 4 Diagnosis Coding ICD-10 Codes Code Description [phone] Non-pressure chronic ulcer of other part of left lower leg with fat layer exposed E11.65 Type 2 diabetes mellitus with  hyperglycemia E87.70 Fluid overload, unspecified E11.42 Type 2 diabetes mellitus with diabetic polyneuropathy N18.6 End stage renal disease I50.42 Chronic combined systolic (congestive) and diastolic (congestive) heart failure I25.10 Atherosclerotic heart  disease of native coronary artery without angina pectoris I42.9 Cardiomyopathy, unspecified I73.9 Peripheral vascular disease, unspecified Z89.511 Acquired absence of right leg below knee Z89.432 Acquired absence of left foot L97.112 Non-pressure chronic ulcer of right thigh with fat layer exposed Facility Procedures CPT4 Code: 16109604 Description: 10140 - IandD HEMATOMA SEROMA ICD-10 Diagnosis Description L97.112 Non-pressure chronic ulcer of right thigh with fat layer exposed Modifier: Quantity: 1 CPT4 Code: [id number] Description: 14782 - DEB SUBQ TISSUE 20 SQ CM/< ICD-10 Diagnosis Description L97.112 Non-pressure chronic ulcer of right thigh with fat layer exposed Modifier: Quantity: 1 CPT4 Code: [id number] Description: 65784 - DEBRIDE WOUND 1ST 20 SQ CM OR < ICD-10 Diagnosis Description L97.822 Non-pressure chronic ulcer of other part of left lower leg with fat layer expose L97.112 Non-pressure chronic ulcer of right thigh with fat layer exposed Modifier: d Quantity: 1 CPT4 Code: 69629528 Description: 41324 - DEBRIDE WOUND EA ADDL 20 SQ CM ICD-10 Diagnosis Description L97.112 Non-pressure chronic ulcer of right thigh with fat layer exposed Modifier: Quantity: 5 Physician Procedures : CPT4 Code Description Modifier [id number] 53664 - WC PHYS LEVEL 4 - EST PT 25 ICD-10 Diagnosis Description L97.822 Non-pressure chronic ulcer of other part of left lower leg with fat layer exposed Z89.511 Acquired absence of right leg below knee L97.112  Non-pressure chronic ulcer of right thigh with fat layer exposed I73.9 Peripheral vascular disease, unspecified Quantity: 1 : [id number] 10140 - WC PHYS TX OF IandD HEMATOMA SEROMA ICD-10 Diagnosis Description L97.112 Non-pressure chronic ulcer of right thigh with fat layer exposed [id number] 11042 - WC PHYS SUBQ TISS 20 SQ CM 1 ICD-10 Diagnosis Description L97.112 Non-pressure  chronic ulcer of right thigh with fat layer exposed Quantity: 1 : [id number] 51884 - WC  PHYS DEBR WO ANESTH 20 SQ CM 1 ICD-10 Diagnosis Description L97.822 Non-pressure chronic ulcer of other part of left lower leg with fat layer exposed L97.112 Non-pressure chronic ulcer of right thigh with fat layer exposed Quantity: : [id number] 16010 - WC PHYS DEBR WO ANESTH EA ADD 20 CM 5 ICD-10 Diagnosis Description L97.112 Non-pressure chronic ulcer of right thigh with fat layer exposed Quantity: Electronic Signature(s) Signed: 01/01/2022 5:05:27 PM By: Fredirick Maudlin MD FACS Entered By: Fredirick Maudlin on 01/01/2022 17:05:27

## 2022-01-03 NOTE — Progress Notes (Signed)
Edward, [name] ([id number]) Visit Report for 01/01/2022 Arrival Information Details Patient Name: Date of Service: Edward [name] MES L. 01/01/2022 2:00 PM Medical Record Number: [id number] Patient Account Number: [id number] Date of Birth/Sex: Treating RN: 1971/05/12 (51 y.o. Edward [name] Primary Care Jaan Fischel: [name] Other Clinician: Donavan Burnet Referring Neely Cecena: Treating Brytnee Bechler/Extender: Kevan Ny in Treatment: 4 Visit Information History Since Last Visit All ordered tests and consults were completed: Yes Patient Arrived: Wheel Chair Added or deleted any medications: No Arrival Time: 14:17 Any new allergies or adverse reactions: No Accompanied By: son Had a fall or experienced change in No Transfer Assistance: None activities of daily living that may affect Patient Identification Verified: Yes risk of falls: Secondary Verification Process Completed: Yes Signs or symptoms of abuse/neglect since last visito No Patient Requires Transmission-Based Precautions: No Hospitalized since last visit: No Patient Has Alerts: Yes Implantable device outside of the clinic excluding No Patient Alerts: Patient on Blood Thinner cellular tissue based products placed in the center since last visit: Pain Present Now: Yes Electronic Signature(s) Signed: 01/01/2022 4:04:33 PM By: Sharyn Creamer RN, BSN Entered By: Sharyn Creamer on 01/01/2022 14:23:01 -------------------------------------------------------------------------------- Encounter Discharge Information Details Patient Name: Date of Service: Edward [name], Edward [name] MES L. 01/01/2022 2:00 PM Medical Record Number: [id number] Patient Account Number: [id number] Date of Birth/Sex: Treating RN: [birth year] (51 y.o. Edward [name] Primary Care Eduarda Scrivens: [name] Other Clinician: Referring Loreena Valeri: Treating Torin Modica/Extender: Kevan Ny in Treatment: 4 Encounter Discharge  Information Items Post Procedure Vitals Discharge Condition: Stable Temperature (F): 98.3 Ambulatory Status: Wheelchair Pulse (bpm): 86 Discharge Destination: Home Respiratory Rate (breaths/min): 18 Transportation: Private Auto Blood Pressure (mmHg): 106/64 Accompanied By: son Schedule Follow-up Appointment: Yes Clinical Summary of Care: Patient Declined Electronic Signature(s) Signed: 01/01/2022 4:04:33 PM By: Sharyn Creamer RN, BSN Entered By: Sharyn Creamer on 01/01/2022 16:02:32 -------------------------------------------------------------------------------- Lower Extremity Assessment Details Patient Name: Date of Service: Edward [name], Edward [name] MES L. 01/01/2022 2:00 PM Medical Record Number: [id number] Patient Account Number: [id number] Date of Birth/Sex: Treating RN: 1971/01/19 (51 y.o. Edward [name] Primary Care Acacia Latorre: [name] Other Clinician: Referring Lucresha Dismuke: Treating Matraca Hunkins/Extender: Cecile Hearing Weeks in Treatment: 4 Edema Assessment Assessed: [Left: No] [Right: No] Edema: [Left: Ye] [Right: s] Calf Left: Right: Point of Measurement: From Medial Instep 39.3 cm Ankle Left: Right: Point of Measurement: From Medial Instep 24.3 cm Vascular Assessment Pulses: Dorsalis Pedis Palpable: [Left:Yes] Electronic Signature(s) Signed: 01/02/2022 6:28:42 PM By: Baruch Gouty RN, BSN Signed: 01/03/2022 12:04:25 PM By: Donavan Burnet CHT EMT BS , , Entered By: Donavan Burnet on 01/01/2022 14:38:34 -------------------------------------------------------------------------------- Multi Wound Chart Details Patient Name: Date of Service: Edward [name], Edward [name] MES L. 01/01/2022 2:00 PM Medical Record Number: [id number] Patient Account Number: [id number] Date of Birth/Sex: Treating RN: 12-17-1970 (51 y.o. Edward [name] Primary Care Jacky Dross: [name] Other Clinician: Referring Emrie Gayle: Treating Jesper Stirewalt/Extender: Kevan Ny in Treatment: 4 Vital Signs Height(in): 72 Pulse(bpm): 47 Weight(lbs): 230 Blood Pressure(mmHg): 106/64 Body Mass Index(BMI): 31.2 Temperature(F): 98.3 Respiratory Rate(breaths/min): 18 Photos: [10:Right, Lateral Lower Leg] [5:Left, Distal, Anterior Lower Leg] [6:Left, Medial Lower Leg] Wound Location: [10:Blister] [5:Blister] [6:Blister] Wounding Event: [10:Arterial Insufficiency Ulcer] [5:Diabetic Wound/Ulcer of the Lower] [6:Diabetic Wound/Ulcer of the Lower] Primary Etiology: [10:Cataracts, Anemia, Sleep Apnea,] [5:Extremity Cataracts, Anemia, Sleep Apnea,] [6:Extremity Cataracts, Anemia, Sleep Apnea,] Comorbid History: [10:Arrhythmia, Congestive Heart Failure, Arrhythmia, Congestive Heart Failure, Arrhythmia, Congestive Heart Failure, Coronary Artery  Disease, Hypertension, Myocardial Infarction, Hypertension, Myocardial Infarction, Hypertension,  Myocardial Infarction, Peripheral Venous Disease, Type II Peripheral Venous Disease, Type II Peripheral Venous Disease, Type II Diabetes, End Stage Renal Disease, Diabetes, End Stage Renal Disease, Diabetes, End Stage Renal Disease, Osteoarthritis,  Osteomyelitis, Neuropathy, Confinement Anxiety 12/05/2021] [5:Coronary Artery Disease, Osteoarthritis, Osteomyelitis, Neuropathy, Confinement Anxiety 09/19/2021] [6:Coronary Artery Disease, Osteoarthritis, Osteomyelitis, Neuropathy, Confinement Anxiety  09/19/2021] Date Acquired: [10:3] [5:4] [6:4] Weeks of Treatment: [10:Open] [5:Open] [6:Open] Wound Status: [10:No] [5:No] [6:No] Wound Recurrence: [10:3.2x3.9x0.1] [5:1.1x2.6x0.1] [6:1.1x0.6x0.1] Measurements L x W x D (cm) [10:9.802] [5:2.246] [6:0.518] A (cm) : rea [10:0.98] [5:0.225] [6:0.052] Volume (cm) : [10:2.50%] [5:0.70%] [6:86.30%] % Reduction in A [10:rea: 2.50%] [5:0.40%] [6:86.20%] % Reduction in Volume: [10:Full Thickness Without Exposed] [5:Grade 1] [6:Grade 1] Classification: [10:Support Structures Medium] [5:Medium]  [6:Medium] Exudate A mount: [10:Serosanguineous] [5:Serous] [6:Serous] Exudate Type: [10:red, brown] [5:amber] [6:amber] Exudate Color: [10:Flat and Intact] [5:Flat and Intact] [6:Flat and Intact] Wound Margin: [10:Small (1-33%)] [5:Small (1-33%)] [6:None Present (0%)] Granulation A mount: [10:Pink, Pale] [5:Red, Pink] [6:N/A] Granulation Quality: [10:Large (67-100%)] [5:Large (67-100%)] [6:Large (67-100%)] Necrotic A mount: [10:Adherent Slough] [5:Adherent Slough] [6:Eschar] Necrotic Tissue: [10:Fat Layer (Subcutaneous Tissue): Yes Fat Layer (Subcutaneous Tissue): Yes Fat Layer (Subcutaneous Tissue): Yes] Exposed Structures: [10:Fascia: No Tendon: No Muscle: No Joint: No Bone: No Small (1-33%)] [5:Fascia: No Tendon: No Muscle: No Joint: No Bone: No Small (1-33%)] [6:Fascia: No Tendon: No Muscle: No Joint: No Bone: No Large (67-100%)] Epithelialization: [10:Debridement - Selective/Open Wound Debridement - Excisional] [6:Debridement - Excisional] Debridement: Pre-procedure Verification/Time Out 15:01 [5:15:01] [6:15:01] Taken: [10:Other] [5:Other] [6:Other] Pain Control: [10:Slough] [5:Subcutaneous, Slough] [6:Subcutaneous, Slough] Tissue Debrided: [10:Non-Viable Tissue] [5:Skin/Subcutaneous Tissue] [6:Skin/Subcutaneous Tissue] Level: [10:10.5] [5:2.86] [6:0.66] Debridement A (sq cm): [10:rea Curette] [5:Curette] [6:Curette] Instrument: [10:Minimum] [5:Minimum] [6:Minimum] Bleeding: [10:Pressure] [5:Pressure] [6:Pressure] Hemostasis A chieved: [10:0] [5:0] [6:0] Procedural Pain: [10:0] [5:0] [6:0] Post Procedural Pain: [10:Procedure was tolerated well] [5:Procedure was tolerated well] [6:Procedure was tolerated well] Debridement Treatment Response: [10:3.2x3.9x0.1] [5:1.1x2.6x0.1] [6:1.1x0.6x0.1] Post Debridement Measurements L x W x D (cm) [10:0.98] [5:0.225] [6:0.052] Post Debridement Volume: (cm) [10:Debridement] [5:Debridement] [6:Debridement] Wound Number: 7 9 N/A Photos:  N/A Right, Medial Knee Right, Anterior Amputation Site - Below N/A Wound Location: Knee Blister Blister N/A Wounding Event: Diabetic Wound/Ulcer of the Lower Diabetic Wound/Ulcer of the Lower N/A Primary Etiology: Extremity Extremity Cataracts, Anemia, Sleep Apnea, Cataracts, Anemia, Sleep Apnea, N/A Comorbid History: Arrhythmia, Congestive Heart Failure, Arrhythmia, Congestive Heart Failure, Coronary Artery Disease, Coronary Artery Disease, Hypertension, Myocardial Infarction, Hypertension, Myocardial Infarction, Peripheral Venous Disease, Type II Peripheral Venous Disease, Type II Diabetes, End Stage Renal Disease, Diabetes, End Stage Renal Disease, Osteoarthritis, Osteomyelitis, Osteoarthritis, Osteomyelitis, Neuropathy, Confinement Anxiety Neuropathy, Confinement Anxiety 09/19/2021 09/19/2021 N/A Date Acquired: 4 4 N/A Weeks of Treatment: Open Open N/A Wound Status: No No N/A Wound Recurrence: 13.7x8.4x0.1 12.5x10.8x0.1 N/A Measurements L x W x D (cm) 90.384 106.029 N/A A (cm) : rea 9.038 10.603 N/A Volume (cm) : -594.90% -12171.90% N/A % Reduction in A rea: -594.70% -12229.10% N/A % Reduction in Volume: Grade 1 Grade 1 N/A Classification: Medium Large N/A Exudate A mount: Serous Serous N/A Exudate Type: amber amber N/A Exudate Color: Flat and Intact Indistinct, nonvisible N/A Wound Margin: None Present (0%) None Present (0%) N/A Granulation A mount: N/A N/A N/A Granulation Quality: Large (67-100%) Large (67-100%) N/A Necrotic A mount: Eschar, Adherent Slough Eschar, Adherent Slough N/A Necrotic Tissue: Fat Layer (Subcutaneous Tissue): Yes Fat Layer (Subcutaneous Tissue): Yes N/A Exposed Structures: Fascia: No Fascia: No Tendon: No Tendon:  No Muscle: No Muscle: No Joint: No Joint: No Bone: No Bone: No None Small (1-33%) N/A Epithelialization: Debridement - Selective/Open Wound Debridement - Selective/Open Wound N/A Debridement: Pre-procedure  Verification/Time Out 15:01 15:01 N/A Taken: Other Other N/A Pain Control: Psychologist, prison and probation services, [employer] N/A Tissue Debrided: Non-Viable Tissue Non-Viable Tissue N/A Level: 77 26 N/A Debridement A (sq cm): rea Curette Curette N/A Instrument: Minimum Minimum N/A Bleeding: Pressure Pressure N/A Hemostasis A chieved: 0 0 N/A Procedural Pain: 0 0 N/A Post Procedural Pain: Procedure was tolerated well Procedure was tolerated well N/A Debridement Treatment Response: 13.7x8.4x0.1 12.5x10.8x0.1 N/A Post Debridement Measurements L x W x D (cm) 9.038 10.603 N/A Post Debridement Volume: (cm) Debridement Debridement N/A Procedures Performed: Incision and Drainage Treatment Notes Wound #10 (Lower Leg) Wound Laterality: Right, Lateral Cleanser Peri-Wound Care Sween Lotion (Moisturizing lotion) Discharge Instruction: Apply moisturizing lotion as directed Topical Primary Dressing Hydrofera Blue Ready Foam, 4x5 in Discharge Instruction: Apply to wound bed as instructed Santyl Ointment Discharge Instruction: Apply nickel thick amount to wound bed as instructed Secondary Dressing Woven Gauze Sponge, Non-Sterile 4x4 in Discharge Instruction: Apply over primary dressing as directed. Secured With Elastic Bandage 4 inch (ACE bandage) Discharge Instruction: Secure with ACE bandage as directed. Kerlix Roll Sterile, 4.5x3.1 (in/yd) Discharge Instruction: Secure with Kerlix as directed. Compression Wrap Compression Stockings Add-Ons Wound #5 (Lower Leg) Wound Laterality: Left, Anterior, Distal Cleanser Peri-Wound Care Sween Lotion (Moisturizing lotion) Discharge Instruction: Apply moisturizing lotion as directed Topical Primary Dressing Hydrofera Blue Ready Foam, 2.5 x2.5 in Discharge Instruction: Apply to wound bed as instructed Santyl Ointment Discharge Instruction: Apply nickel thick amount to wound bed as instructed Secondary Dressing Woven Gauze Sponge, Non-Sterile 4x4  in Discharge Instruction: Apply over primary dressing as directed. Secured With Compression Wrap Kerlix Roll 4.5x3.1 (in/yd) Discharge Instruction: Apply Kerlix and Coban compression as directed. Coban Self-Adherent Wrap 4x5 (in/yd) Discharge Instruction: Apply over Kerlix as directed. Compression Stockings Add-Ons Wound #6 (Lower Leg) Wound Laterality: Left, Medial Cleanser Peri-Wound Care Sween Lotion (Moisturizing lotion) Discharge Instruction: Apply moisturizing lotion as directed Topical Primary Dressing Hydrofera Blue Ready Foam, 2.5 x2.5 in Discharge Instruction: Apply to wound bed as instructed Secondary Dressing Woven Gauze Sponge, Non-Sterile 4x4 in Discharge Instruction: Apply over primary dressing as directed. Secured With Compression Wrap Kerlix Roll 4.5x3.1 (in/yd) Discharge Instruction: Apply Kerlix and Coban compression as directed. Coban Self-Adherent Wrap 4x5 (in/yd) Discharge Instruction: Apply over Kerlix as directed. Compression Stockings Add-Ons Wound #7 (Knee) Wound Laterality: Right, Medial Cleanser Peri-Wound Care Sween Lotion (Moisturizing lotion) Discharge Instruction: Apply moisturizing lotion as directed Topical Primary Dressing Hydrofera Blue Ready Foam, 4x5 in Discharge Instruction: Apply to wound bed as instructed Santyl Ointment Discharge Instruction: Apply nickel thick amount to wound bed as instructed Secondary Dressing Woven Gauze Sponge, Non-Sterile 4x4 in Discharge Instruction: Apply over primary dressing as directed. Secured With Elastic Bandage 4 inch (ACE bandage) Discharge Instruction: Secure with ACE bandage as directed. Kerlix Roll Sterile, 4.5x3.1 (in/yd) Discharge Instruction: Secure with Kerlix as directed. Compression Wrap Compression Stockings Add-Ons Wound #9 (Amputation Site - Below Knee) Wound Laterality: Right, Anterior Cleanser Peri-Wound Care Sween Lotion (Moisturizing lotion) Discharge Instruction: Apply  moisturizing lotion as directed Topical Primary Dressing Hydrofera Blue Ready Foam, 4x5 in Discharge Instruction: Apply to wound bed as instructed Santyl Ointment Discharge Instruction: Apply nickel thick amount to wound bed as instructed Secondary Dressing Woven Gauze Sponge, Non-Sterile 4x4 in Discharge Instruction: Apply over primary dressing as directed. Secured With Elastic Bandage 4 inch (ACE bandage) Discharge  Instruction: Secure with ACE bandage as directed. Kerlix Roll Sterile, 4.5x3.1 (in/yd) Discharge Instruction: Secure with Kerlix as directed. Compression Wrap Compression Stockings Add-Ons Electronic Signature(s) Signed: 01/01/2022 4:55:23 PM By: Fredirick Maudlin MD FACS Signed: 01/02/2022 6:28:42 PM By: Baruch Gouty RN, BSN Entered By: Fredirick Maudlin on 01/01/2022 16:55:22 -------------------------------------------------------------------------------- Multi-Disciplinary Care Plan Details Patient Name: Date of Service: Edward [name], Edward [name] MES L. 01/01/2022 2:00 PM Medical Record Number: [id number] Patient Account Number: [id number] Date of Birth/Sex: Treating RN: Aug 30, 1970 (51 y.o. Edward [name] Primary Care Kenisha Lynds: [name] Other Clinician: Referring Laneya Gasaway: Treating Mickey Esguerra/Extender: Kevan Ny in Treatment: 4 Multidisciplinary Care Plan reviewed with physician Active Inactive Abuse / Safety / Falls / Self Care Management Nursing Diagnoses: History of Falls Potential for falls Goals: Patient/caregiver will verbalize/demonstrate measures taken to prevent injury and/or falls Date Initiated: 11/28/2021 Target Resolution Date: 01/23/2022 Goal Status: Active Interventions: Assess fall risk on admission and as needed Assess: immobility, friction, shearing, incontinence upon admission and as needed Assess impairment of mobility on admission and as needed per policy Treatment Activities: Patient referred to home care :  11/28/2021 Notes: Nutrition Nursing Diagnoses: Impaired glucose control: actual or potential Potential for alteratiion in Nutrition/Potential for imbalanced nutrition Goals: Patient/caregiver will maintain therapeutic glucose control Date Initiated: 11/28/2021 Target Resolution Date: 01/23/2022 Goal Status: Active Interventions: Assess HgA1c results as ordered upon admission and as needed Assess patient nutrition upon admission and as needed per policy Provide education on elevated blood sugars and impact on wound healing Treatment Activities: Patient referred to Primary Care Physician for further nutritional evaluation : 11/28/2021 Notes: Venous Leg Ulcer Nursing Diagnoses: Knowledge deficit related to disease process and management Potential for venous Insuffiency (use before diagnosis confirmed) Goals: Patient will maintain optimal edema control Date Initiated: 11/28/2021 Target Resolution Date: 01/23/2022 Goal Status: Active Interventions: Assess peripheral edema status every visit. Compression as ordered Provide education on venous insufficiency Treatment Activities: Therapeutic compression applied : 11/28/2021 Notes: Wound/Skin Impairment Nursing Diagnoses: Impaired tissue integrity Knowledge deficit related to ulceration/compromised skin integrity Goals: Patient/caregiver will verbalize understanding of skin care regimen Date Initiated: 11/28/2021 Target Resolution Date: 01/23/2022 Goal Status: Active Ulcer/skin breakdown will have a volume reduction of 30% by week 4 Date Initiated: 11/28/2021 Target Resolution Date: 01/23/2022 Goal Status: Active Interventions: Assess patient/caregiver ability to obtain necessary supplies Assess patient/caregiver ability to perform ulcer/skin care regimen upon admission and as needed Assess ulceration(s) every visit Provide education on ulcer and skin care Treatment Activities: Skin care regimen initiated : 11/28/2021 Topical wound  management initiated : 11/28/2021 Notes: Electronic Signature(s) Signed: 01/01/2022 4:04:33 PM By: Sharyn Creamer RN, BSN Entered By: Sharyn Creamer on 01/01/2022 15:24:23 -------------------------------------------------------------------------------- Pain Assessment Details Patient Name: Date of Service: Edward [name], [name] 01/01/2022 2:00 PM Medical Record Number: [id number] Patient Account Number: [id number] Date of Birth/Sex: Treating RN: 21-Sep-1970 (51 y.o. Edward [name] Primary Care Marnesha Gagen: [name] Other Clinician: Referring Carnell Casamento: Treating Burnie Therien/Extender: Kevan Ny in Treatment: 4 Active Problems Location of Pain Severity and Description of Pain Patient Has Paino Yes Site Locations Duration of the Pain. Constant / Intermittento Intermittent Rate the pain. Current Pain Level: 5 Worst Pain Level: 10 Least Pain Level: 2 Tolerable Pain Level: 6 Character of Pain Describe the Pain: Aching Pain Management and Medication Current Pain Management: Medication: Yes Rest: Yes Electronic Signature(s) Signed: 01/02/2022 6:28:42 PM By: Baruch Gouty RN, BSN Signed: 01/03/2022 12:04:25 PM By: Donavan Burnet CHT EMT BS , ,  Entered By: Donavan Burnet on 01/01/2022 14:36:26 -------------------------------------------------------------------------------- Patient/Caregiver Education Details Patient Name: Date of Service: Edward [name] MES L. 5/16/2023andnbsp2:00 PM Medical Record Number: [id number] Patient Account Number: [id number] Date of Birth/Gender: Treating RN: [birth year] (51 y.o. Edward [name] Primary Care Physician: [name] Other Clinician: Referring Physician: Treating Physician/Extender: Kevan Ny in Treatment: 4 Education Assessment Education Provided To: Patient and Caregiver Education Topics Provided Venous: Methods: Explain/Verbal Responses: State content  correctly Wound/Skin Impairment: Methods: Explain/Verbal Responses: State content correctly Electronic Signature(s) Signed: 01/01/2022 4:04:33 PM By: Sharyn Creamer RN, BSN Entered By: Sharyn Creamer on 01/01/2022 15:25:32 -------------------------------------------------------------------------------- Wound Assessment Details Patient Name: Date of Service: Edward [name], [name] 01/01/2022 2:00 PM Medical Record Number: [id number] Patient Account Number: [id number] Date of Birth/Sex: Treating RN: 15-Nov-1970 (51 y.o. Edward [name] Primary Care Khylah Kendra: [name] Other Clinician: Referring Samarion Ehle: Treating Aranza Geddes/Extender: Cecile Hearing Weeks in Treatment: 4 Wound Status Wound Number: 10 Primary Arterial Insufficiency Ulcer Etiology: Wound Location: Right, Lateral Lower Leg Wound Open Wounding Event: Blister Status: Date Acquired: 12/05/2021 Comorbid Cataracts, Anemia, Sleep Apnea, Arrhythmia, Congestive Heart Weeks Of Treatment: 3 History: Failure, Coronary Artery Disease, Hypertension, Myocardial Clustered Wound: No Infarction, Peripheral Venous Disease, Type II Diabetes, End Stage Renal Disease, Osteoarthritis, Osteomyelitis, Neuropathy, Confinement Anxiety Photos Wound Measurements Length: (cm) 3.2 Width: (cm) 3.9 Depth: (cm) 0.1 Area: (cm) 9.802 Volume: (cm) 0.98 % Reduction in Area: 2.5% % Reduction in Volume: 2.5% Epithelialization: Small (1-33%) Tunneling: No Undermining: No Wound Description Classification: Full Thickness Without Exposed Support Structures Wound Margin: Flat and Intact Exudate Amount: Medium Exudate Type: Serosanguineous Exudate Color: red, brown Foul Odor After Cleansing: No Slough/Fibrino Yes Wound Bed Granulation Amount: Small (1-33%) Exposed Structure Granulation Quality: Pink, Pale Fascia Exposed: No Necrotic Amount: Large (67-100%) Fat Layer (Subcutaneous Tissue) Exposed: Yes Necrotic Quality:  Adherent Slough Tendon Exposed: No Muscle Exposed: No Joint Exposed: No Bone Exposed: No Treatment Notes Wound #10 (Lower Leg) Wound Laterality: Right, Lateral Cleanser Peri-Wound Care Sween Lotion (Moisturizing lotion) Discharge Instruction: Apply moisturizing lotion as directed Topical Primary Dressing Hydrofera Blue Ready Foam, 4x5 in Discharge Instruction: Apply to wound bed as instructed Santyl Ointment Discharge Instruction: Apply nickel thick amount to wound bed as instructed Secondary Dressing Woven Gauze Sponge, Non-Sterile 4x4 in Discharge Instruction: Apply over primary dressing as directed. Secured With Elastic Bandage 4 inch (ACE bandage) Discharge Instruction: Secure with ACE bandage as directed. Kerlix Roll Sterile, 4.5x3.1 (in/yd) Discharge Instruction: Secure with Kerlix as directed. Compression Wrap Compression Stockings Add-Ons Electronic Signature(s) Signed: 01/01/2022 4:04:33 PM By: Sharyn Creamer RN, BSN Signed: 01/02/2022 6:28:42 PM By: Baruch Gouty RN, BSN Entered By: Sharyn Creamer on 01/01/2022 14:51:27 -------------------------------------------------------------------------------- Wound Assessment Details Patient Name: Date of Service: Edward [name], [name] 01/01/2022 2:00 PM Medical Record Number: [id number] Patient Account Number: [id number] Date of Birth/Sex: Treating RN: 1970-11-20 (51 y.o. Edward [name] Primary Care Tiffinie Caillier: [name] Other Clinician: Referring Antha Niday: Treating Faline Langer/Extender: Cecile Hearing Weeks in Treatment: 4 Wound Status Wound Number: 5 Primary Diabetic Wound/Ulcer of the Lower Extremity Etiology: Wound Location: Left, Distal, Anterior Lower Leg Wound Open Wounding Event: Blister Status: Date Acquired: 09/19/2021 Comorbid Cataracts, Anemia, Sleep Apnea, Arrhythmia, Congestive Heart Weeks Of Treatment: 4 History: Failure, Coronary Artery Disease, Hypertension, Myocardial Clustered  Wound: No Infarction, Peripheral Venous Disease, Type II Diabetes, End Stage Renal Disease, Osteoarthritis, Osteomyelitis, Neuropathy, Confinement Anxiety Photos Wound Measurements Length: (cm) 1.1 Width: (cm) 2.6  Depth: (cm) 0.1 Area: (cm) 2.246 Volume: (cm) 0.225 % Reduction in Area: 0.7% % Reduction in Volume: 0.4% Epithelialization: Small (1-33%) Tunneling: No Undermining: No Wound Description Classification: Grade 1 Wound Margin: Flat and Intact Exudate Amount: Medium Exudate Type: Serous Exudate Color: amber Foul Odor After Cleansing: No Slough/Fibrino Yes Wound Bed Granulation Amount: Small (1-33%) Exposed Structure Granulation Quality: Red, Pink Fascia Exposed: No Necrotic Amount: Large (67-100%) Fat Layer (Subcutaneous Tissue) Exposed: Yes Necrotic Quality: Adherent Slough Tendon Exposed: No Muscle Exposed: No Joint Exposed: No Bone Exposed: No Treatment Notes Wound #5 (Lower Leg) Wound Laterality: Left, Anterior, Distal Cleanser Peri-Wound Care Sween Lotion (Moisturizing lotion) Discharge Instruction: Apply moisturizing lotion as directed Topical Primary Dressing Hydrofera Blue Ready Foam, 2.5 x2.5 in Discharge Instruction: Apply to wound bed as instructed Santyl Ointment Discharge Instruction: Apply nickel thick amount to wound bed as instructed Secondary Dressing Woven Gauze Sponge, Non-Sterile 4x4 in Discharge Instruction: Apply over primary dressing as directed. Secured With Compression Wrap Kerlix Roll 4.5x3.1 (in/yd) Discharge Instruction: Apply Kerlix and Coban compression as directed. Coban Self-Adherent Wrap 4x5 (in/yd) Discharge Instruction: Apply over Kerlix as directed. Compression Stockings Add-Ons Electronic Signature(s) Signed: 01/01/2022 4:04:33 PM By: Sharyn Creamer RN, BSN Signed: 01/02/2022 6:28:42 PM By: Baruch Gouty RN, BSN Entered By: Sharyn Creamer on 01/01/2022  14:53:23 -------------------------------------------------------------------------------- Wound Assessment Details Patient Name: Date of Service: Edward [name], [name] 01/01/2022 2:00 PM Medical Record Number: [id number] Patient Account Number: [id number] Date of Birth/Sex: Treating RN: [birth year] (51 y.o. Edward [name] Primary Care Leighla Chestnutt: [name] Other Clinician: Referring Carey Johndrow: Treating Adonnis Salceda/Extender: Cecile Hearing Weeks in Treatment: 4 Wound Status Wound Number: 6 Primary Diabetic Wound/Ulcer of the Lower Extremity Etiology: Wound Location: Left, Medial Lower Leg Wound Open Wounding Event: Blister Status: Date Acquired: 09/19/2021 Comorbid Cataracts, Anemia, Sleep Apnea, Arrhythmia, Congestive Heart Weeks Of Treatment: 4 History: Failure, Coronary Artery Disease, Hypertension, Myocardial Clustered Wound: No Infarction, Peripheral Venous Disease, Type II Diabetes, End Stage Renal Disease, Osteoarthritis, Osteomyelitis, Neuropathy, Confinement Anxiety Photos Wound Measurements Length: (cm) 1.1 Width: (cm) 0.6 Depth: (cm) 0.1 Area: (cm) 0.518 Volume: (cm) 0.052 % Reduction in Area: 86.3% % Reduction in Volume: 86.2% Epithelialization: Large (67-100%) Tunneling: No Undermining: No Wound Description Classification: Grade 1 Wound Margin: Flat and Intact Exudate Amount: Medium Exudate Type: Serous Exudate Color: amber Foul Odor After Cleansing: No Slough/Fibrino No Wound Bed Granulation Amount: None Present (0%) Exposed Structure Necrotic Amount: Large (67-100%) Fascia Exposed: No Necrotic Quality: Eschar Fat Layer (Subcutaneous Tissue) Exposed: Yes Tendon Exposed: No Muscle Exposed: No Joint Exposed: No Bone Exposed: No Treatment Notes Wound #6 (Lower Leg) Wound Laterality: Left, Medial Cleanser Peri-Wound Care Sween Lotion (Moisturizing lotion) Discharge Instruction: Apply moisturizing lotion as  directed Topical Primary Dressing Hydrofera Blue Ready Foam, 2.5 x2.5 in Discharge Instruction: Apply to wound bed as instructed Secondary Dressing Woven Gauze Sponge, Non-Sterile 4x4 in Discharge Instruction: Apply over primary dressing as directed. Secured With Compression Wrap Kerlix Roll 4.5x3.1 (in/yd) Discharge Instruction: Apply Kerlix and Coban compression as directed. Coban Self-Adherent Wrap 4x5 (in/yd) Discharge Instruction: Apply over Kerlix as directed. Compression Stockings Add-Ons Electronic Signature(s) Signed: 01/01/2022 4:04:33 PM By: Sharyn Creamer RN, BSN Signed: 01/02/2022 6:28:42 PM By: Baruch Gouty RN, BSN Entered By: Sharyn Creamer on 01/01/2022 14:52:34 -------------------------------------------------------------------------------- Wound Assessment Details Patient Name: Date of Service: Edward [name], [name] 01/01/2022 2:00 PM Medical Record Number: [id number] Patient Account Number: [id number] Date of Birth/Sex: Treating RN: 09/02/1970 (51 y.o. Edward [name]  Primary Care Aziza Stuckert: [name] Other Clinician: Referring Eathel Pajak: Treating Sruthi Maurer/Extender: Kevan Ny in Treatment: 4 Wound Status Wound Number: 7 Primary Diabetic Wound/Ulcer of the Lower Extremity Etiology: Wound Location: Right, Medial Knee Wound Open Wounding Event: Blister Status: Date Acquired: 09/19/2021 Comorbid Cataracts, Anemia, Sleep Apnea, Arrhythmia, Congestive Heart Weeks Of Treatment: 4 History: Failure, Coronary Artery Disease, Hypertension, Myocardial Clustered Wound: No Infarction, Peripheral Venous Disease, Type II Diabetes, End Stage Renal Disease, Osteoarthritis, Osteomyelitis, Neuropathy, Confinement Anxiety Photos Wound Measurements Length: (cm) 13.7 Width: (cm) 8.4 Depth: (cm) 0.1 Area: (cm) 90.384 Volume: (cm) 9.038 % Reduction in Area: -594.9% % Reduction in Volume: -594.7% Epithelialization: None Tunneling:  No Undermining: No Wound Description Classification: Grade 1 Wound Margin: Flat and Intact Exudate Amount: Medium Exudate Type: Serous Exudate Color: amber Foul Odor After Cleansing: No Slough/Fibrino Yes Wound Bed Granulation Amount: None Present (0%) Exposed Structure Necrotic Amount: Large (67-100%) Fascia Exposed: No Necrotic Quality: Eschar, Adherent Slough Fat Layer (Subcutaneous Tissue) Exposed: Yes Tendon Exposed: No Muscle Exposed: No Joint Exposed: No Bone Exposed: No Treatment Notes Wound #7 (Knee) Wound Laterality: Right, Medial Cleanser Peri-Wound Care Sween Lotion (Moisturizing lotion) Discharge Instruction: Apply moisturizing lotion as directed Topical Primary Dressing Hydrofera Blue Ready Foam, 4x5 in Discharge Instruction: Apply to wound bed as instructed Santyl Ointment Discharge Instruction: Apply nickel thick amount to wound bed as instructed Secondary Dressing Woven Gauze Sponge, Non-Sterile 4x4 in Discharge Instruction: Apply over primary dressing as directed. Secured With Elastic Bandage 4 inch (ACE bandage) Discharge Instruction: Secure with ACE bandage as directed. Kerlix Roll Sterile, 4.5x3.1 (in/yd) Discharge Instruction: Secure with Kerlix as directed. Compression Wrap Compression Stockings Add-Ons Electronic Signature(s) Signed: 01/01/2022 4:04:33 PM By: Sharyn Creamer RN, BSN Signed: 01/02/2022 6:28:42 PM By: Baruch Gouty RN, BSN Entered By: Sharyn Creamer on 01/01/2022 14:53:52 -------------------------------------------------------------------------------- Wound Assessment Details Patient Name: Date of Service: Edward [name], [name] 01/01/2022 2:00 PM Medical Record Number: [id number] Patient Account Number: [id number] Date of Birth/Sex: Treating RN: 1971/08/06 (51 y.o. Edward [name] Primary Care Champ Keetch: [name] Other Clinician: Referring Britaney Espaillat: Treating Rosbel Buckner/Extender: Kevan Ny in  Treatment: 4 Wound Status Wound Number: 9 Primary Diabetic Wound/Ulcer of the Lower Extremity Etiology: Wound Location: Right, Anterior Amputation Site - Below Knee Wound Open Wounding Event: Blister Status: Date Acquired: 09/19/2021 Comorbid Cataracts, Anemia, Sleep Apnea, Arrhythmia, Congestive Heart Weeks Of Treatment: 4 History: Failure, Coronary Artery Disease, Hypertension, Myocardial Clustered Wound: No Infarction, Peripheral Venous Disease, Type II Diabetes, End Stage Renal Disease, Osteoarthritis, Osteomyelitis, Neuropathy, Confinement Anxiety Photos Wound Measurements Length: (cm) 12.5 Width: (cm) 10.8 Depth: (cm) 0.1 Area: (cm) 106.029 Volume: (cm) 10.603 % Reduction in Area: -12171.9% % Reduction in Volume: -12229.1% Epithelialization: Small (1-33%) Tunneling: No Undermining: No Wound Description Classification: Grade 1 Wound Margin: Indistinct, nonvisible Exudate Amount: Large Exudate Type: Serous Exudate Color: amber Foul Odor After Cleansing: No Slough/Fibrino Yes Wound Bed Granulation Amount: None Present (0%) Exposed Structure Necrotic Amount: Large (67-100%) Fascia Exposed: No Necrotic Quality: Eschar, Adherent Slough Fat Layer (Subcutaneous Tissue) Exposed: Yes Tendon Exposed: No Muscle Exposed: No Joint Exposed: No Bone Exposed: No Treatment Notes Wound #9 (Amputation Site - Below Knee) Wound Laterality: Right, Anterior Cleanser Peri-Wound Care Sween Lotion (Moisturizing lotion) Discharge Instruction: Apply moisturizing lotion as directed Topical Primary Dressing Hydrofera Blue Ready Foam, 4x5 in Discharge Instruction: Apply to wound bed as instructed Santyl Ointment Discharge Instruction: Apply nickel thick amount to wound bed as instructed Secondary Dressing  Woven Gauze Sponge, Non-Sterile 4x4 in Discharge Instruction: Apply over primary dressing as directed. Secured With Elastic Bandage 4 inch (ACE bandage) Discharge Instruction:  Secure with ACE bandage as directed. Kerlix Roll Sterile, 4.5x3.1 (in/yd) Discharge Instruction: Secure with Kerlix as directed. Compression Wrap Compression Stockings Add-Ons Electronic Signature(s) Signed: 01/01/2022 4:04:33 PM By: Sharyn Creamer RN, BSN Signed: 01/02/2022 6:28:42 PM By: Baruch Gouty RN, BSN Entered By: Sharyn Creamer on 01/01/2022 14:54:27 -------------------------------------------------------------------------------- Vitals Details Patient Name: Date of Service: Edward [name], [name] 01/01/2022 2:00 PM Medical Record Number: [id number] Patient Account Number: [id number] Date of Birth/Sex: Treating RN: [birth year] (51 y.o. Edward [name] Primary Care Selden Noteboom: [name] Other Clinician: Donavan Burnet Referring Deshana Rominger: Treating Aveer Bartow/Extender: Kevan Ny in Treatment: 4 Vital Signs Time Taken: 14:23 Temperature (F): 98.3 Height (in): 72 Pulse (bpm): 86 Weight (lbs): 230 Respiratory Rate (breaths/min): 18 Body Mass Index (BMI): 31.2 Blood Pressure (mmHg): 106/64 Reference Range: 80 - 120 mg / dl Electronic Signature(s) Signed: 01/03/2022 12:04:25 PM By: Donavan Burnet CHT EMT BS , , Entered By: Donavan Burnet on 01/01/2022 14:25:03

## 2022-01-07 ENCOUNTER — Encounter (HOSPITAL_COMMUNITY)
Admission: AD | Disposition: A | Discharge status: Home Health | Payer: Self-pay | Source: Home / Self Care | Attending: Internal Medicine

## 2022-01-07 ENCOUNTER — Other Ambulatory Visit: Payer: Self-pay

## 2022-01-07 ENCOUNTER — Inpatient Hospital Stay (HOSPITAL_COMMUNITY)
Admission: AD | Admit: 2022-01-07 | Discharge: 2022-01-16 | DRG: 474 | Disposition: A | Discharge status: Home Health | Payer: Medicare Other | Attending: Family Medicine | Admitting: Family Medicine

## 2022-01-07 DIAGNOSIS — Z89022 Acquired absence of left finger(s): Secondary | ICD-10-CM | POA: Diagnosis not present

## 2022-01-07 DIAGNOSIS — I2510 Atherosclerotic heart disease of native coronary artery without angina pectoris: Secondary | ICD-10-CM | POA: Diagnosis not present

## 2022-01-07 DIAGNOSIS — I252 Old myocardial infarction: Secondary | ICD-10-CM | POA: Diagnosis not present

## 2022-01-07 DIAGNOSIS — I951 Orthostatic hypotension: Secondary | ICD-10-CM | POA: Diagnosis present

## 2022-01-07 DIAGNOSIS — Z7902 Long term (current) use of antithrombotics/antiplatelets: Secondary | ICD-10-CM

## 2022-01-07 DIAGNOSIS — E1142 Type 2 diabetes mellitus with diabetic polyneuropathy: Secondary | ICD-10-CM | POA: Diagnosis present

## 2022-01-07 DIAGNOSIS — E785 Hyperlipidemia, unspecified: Secondary | ICD-10-CM | POA: Diagnosis present

## 2022-01-07 DIAGNOSIS — R6521 Severe sepsis with septic shock: Secondary | ICD-10-CM | POA: Diagnosis not present

## 2022-01-07 DIAGNOSIS — I714 Abdominal aortic aneurysm, without rupture, unspecified: Secondary | ICD-10-CM | POA: Diagnosis present

## 2022-01-07 DIAGNOSIS — Z888 Allergy status to other drugs, medicaments and biological substances status: Secondary | ICD-10-CM

## 2022-01-07 DIAGNOSIS — I5042 Chronic combined systolic (congestive) and diastolic (congestive) heart failure: Secondary | ICD-10-CM | POA: Diagnosis present

## 2022-01-07 DIAGNOSIS — L03115 Cellulitis of right lower limb: Secondary | ICD-10-CM | POA: Diagnosis not present

## 2022-01-07 DIAGNOSIS — Z89432 Acquired absence of left foot: Secondary | ICD-10-CM

## 2022-01-07 DIAGNOSIS — Z9861 Coronary angioplasty status: Secondary | ICD-10-CM | POA: Diagnosis not present

## 2022-01-07 DIAGNOSIS — Z992 Dependence on renal dialysis: Secondary | ICD-10-CM | POA: Diagnosis not present

## 2022-01-07 DIAGNOSIS — T8743 Infection of amputation stump, right lower extremity: Secondary | ICD-10-CM | POA: Diagnosis present

## 2022-01-07 DIAGNOSIS — I739 Peripheral vascular disease, unspecified: Principal | ICD-10-CM | POA: Diagnosis present

## 2022-01-07 DIAGNOSIS — I70268 Atherosclerosis of native arteries of extremities with gangrene, other extremity: Secondary | ICD-10-CM | POA: Diagnosis present

## 2022-01-07 DIAGNOSIS — I70292 Other atherosclerosis of native arteries of extremities, left leg: Secondary | ICD-10-CM | POA: Diagnosis not present

## 2022-01-07 DIAGNOSIS — Z800 Family history of malignant neoplasm of digestive organs: Secondary | ICD-10-CM

## 2022-01-07 DIAGNOSIS — D62 Acute posthemorrhagic anemia: Secondary | ICD-10-CM | POA: Diagnosis not present

## 2022-01-07 DIAGNOSIS — I120 Hypertensive chronic kidney disease with stage 5 chronic kidney disease or end stage renal disease: Secondary | ICD-10-CM | POA: Diagnosis not present

## 2022-01-07 DIAGNOSIS — I441 Atrioventricular block, second degree: Secondary | ICD-10-CM | POA: Diagnosis not present

## 2022-01-07 DIAGNOSIS — E118 Type 2 diabetes mellitus with unspecified complications: Secondary | ICD-10-CM

## 2022-01-07 DIAGNOSIS — R6520 Severe sepsis without septic shock: Secondary | ICD-10-CM | POA: Diagnosis not present

## 2022-01-07 DIAGNOSIS — Y835 Amputation of limb(s) as the cause of abnormal reaction of the patient, or of later complication, without mention of misadventure at the time of the procedure: Secondary | ICD-10-CM | POA: Diagnosis present

## 2022-01-07 DIAGNOSIS — Z794 Long term (current) use of insulin: Secondary | ICD-10-CM | POA: Diagnosis not present

## 2022-01-07 DIAGNOSIS — M87044 Idiopathic aseptic necrosis of right finger(s): Secondary | ICD-10-CM | POA: Diagnosis not present

## 2022-01-07 DIAGNOSIS — A419 Sepsis, unspecified organism: Secondary | ICD-10-CM | POA: Diagnosis not present

## 2022-01-07 DIAGNOSIS — K219 Gastro-esophageal reflux disease without esophagitis: Secondary | ICD-10-CM | POA: Diagnosis present

## 2022-01-07 DIAGNOSIS — Z7982 Long term (current) use of aspirin: Secondary | ICD-10-CM

## 2022-01-07 DIAGNOSIS — F32A Depression, unspecified: Secondary | ICD-10-CM | POA: Diagnosis present

## 2022-01-07 DIAGNOSIS — M898X9 Other specified disorders of bone, unspecified site: Secondary | ICD-10-CM | POA: Diagnosis present

## 2022-01-07 DIAGNOSIS — D631 Anemia in chronic kidney disease: Secondary | ICD-10-CM | POA: Diagnosis present

## 2022-01-07 DIAGNOSIS — E875 Hyperkalemia: Secondary | ICD-10-CM | POA: Diagnosis present

## 2022-01-07 DIAGNOSIS — E1122 Type 2 diabetes mellitus with diabetic chronic kidney disease: Secondary | ICD-10-CM | POA: Diagnosis not present

## 2022-01-07 DIAGNOSIS — I255 Ischemic cardiomyopathy: Secondary | ICD-10-CM | POA: Diagnosis present

## 2022-01-07 DIAGNOSIS — E876 Hypokalemia: Secondary | ICD-10-CM | POA: Diagnosis not present

## 2022-01-07 DIAGNOSIS — E1151 Type 2 diabetes mellitus with diabetic peripheral angiopathy without gangrene: Secondary | ICD-10-CM | POA: Diagnosis present

## 2022-01-07 DIAGNOSIS — S81802A Unspecified open wound, left lower leg, initial encounter: Secondary | ICD-10-CM | POA: Diagnosis not present

## 2022-01-07 DIAGNOSIS — F419 Anxiety disorder, unspecified: Secondary | ICD-10-CM | POA: Diagnosis present

## 2022-01-07 DIAGNOSIS — N186 End stage renal disease: Secondary | ICD-10-CM | POA: Diagnosis not present

## 2022-01-07 DIAGNOSIS — G9341 Metabolic encephalopathy: Secondary | ICD-10-CM | POA: Diagnosis not present

## 2022-01-07 DIAGNOSIS — Z87891 Personal history of nicotine dependence: Secondary | ICD-10-CM

## 2022-01-07 DIAGNOSIS — N185 Chronic kidney disease, stage 5: Secondary | ICD-10-CM | POA: Diagnosis not present

## 2022-01-07 DIAGNOSIS — T8781 Dehiscence of amputation stump: Secondary | ICD-10-CM | POA: Diagnosis not present

## 2022-01-07 DIAGNOSIS — Z885 Allergy status to narcotic agent status: Secondary | ICD-10-CM | POA: Diagnosis not present

## 2022-01-07 DIAGNOSIS — I472 Ventricular tachycardia, unspecified: Secondary | ICD-10-CM | POA: Diagnosis not present

## 2022-01-07 DIAGNOSIS — Z833 Family history of diabetes mellitus: Secondary | ICD-10-CM

## 2022-01-07 DIAGNOSIS — I96 Gangrene, not elsewhere classified: Secondary | ICD-10-CM | POA: Diagnosis not present

## 2022-01-07 DIAGNOSIS — I132 Hypertensive heart and chronic kidney disease with heart failure and with stage 5 chronic kidney disease, or end stage renal disease: Secondary | ICD-10-CM | POA: Diagnosis present

## 2022-01-07 DIAGNOSIS — E1152 Type 2 diabetes mellitus with diabetic peripheral angiopathy with gangrene: Secondary | ICD-10-CM | POA: Diagnosis present

## 2022-01-07 DIAGNOSIS — Z9862 Peripheral vascular angioplasty status: Secondary | ICD-10-CM

## 2022-01-07 DIAGNOSIS — Z79899 Other long term (current) drug therapy: Secondary | ICD-10-CM

## 2022-01-07 DIAGNOSIS — Z96642 Presence of left artificial hip joint: Secondary | ICD-10-CM | POA: Diagnosis present

## 2022-01-07 DIAGNOSIS — T8753 Necrosis of amputation stump, right lower extremity: Secondary | ICD-10-CM | POA: Diagnosis present

## 2022-01-07 DIAGNOSIS — D638 Anemia in other chronic diseases classified elsewhere: Secondary | ICD-10-CM | POA: Diagnosis present

## 2022-01-07 DIAGNOSIS — G4733 Obstructive sleep apnea (adult) (pediatric): Secondary | ICD-10-CM | POA: Diagnosis present

## 2022-01-07 DIAGNOSIS — M62262 Nontraumatic ischemic infarction of muscle, left lower leg: Secondary | ICD-10-CM | POA: Diagnosis present

## 2022-01-07 DIAGNOSIS — Z89511 Acquired absence of right leg below knee: Secondary | ICD-10-CM

## 2022-01-07 DIAGNOSIS — I70208 Unspecified atherosclerosis of native arteries of extremities, other extremity: Secondary | ICD-10-CM | POA: Diagnosis not present

## 2022-01-07 DIAGNOSIS — Z955 Presence of coronary angioplasty implant and graft: Secondary | ICD-10-CM

## 2022-01-07 HISTORY — PX: ABDOMINAL AORTOGRAM: CATH118222

## 2022-01-07 HISTORY — PX: LOWER EXTREMITY ANGIOGRAPHY: CATH118251

## 2022-01-07 HISTORY — PX: AORTIC ARCH ANGIOGRAPHY: CATH118224

## 2022-01-07 HISTORY — PX: UPPER EXTREMITY ANGIOGRAPHY: CATH118270

## 2022-01-07 LAB — CREATININE, SERUM
Creatinine, Ser: 8.6 mg/dL — ABNORMAL HIGH (ref 0.61–1.24)
GFR, Estimated: 7 mL/min — ABNORMAL LOW (ref >60)

## 2022-01-07 LAB — CBC
HCT: 26.6 % — ABNORMAL LOW (ref 39.0–52.0)
Hemoglobin: 8.9 g/dL — ABNORMAL LOW (ref 13.0–17.0)
MCH: 32.6 pg (ref 26.0–34.0)
MCHC: 33.5 g/dL (ref 30.0–36.0)
MCV: 97.4 fL (ref 80.0–100.0)
Platelets: 283 10*3/uL (ref 150–400)
RBC: 2.73 MIL/uL — ABNORMAL LOW (ref 4.22–5.81)
RDW: 15.6 % — ABNORMAL HIGH (ref 11.5–15.5)
WBC: 6.6 10*3/uL (ref 4.0–10.5)
nRBC: 0 % (ref 0.0–0.2)

## 2022-01-07 LAB — GLUCOSE, CAPILLARY: Glucose-Capillary: 118 mg/dL — ABNORMAL HIGH (ref 70–99)

## 2022-01-07 LAB — HEMOGLOBIN A1C
Hgb A1c MFr Bld: 6.5 % — ABNORMAL HIGH (ref 4.8–5.6)
Mean Plasma Glucose: 139.85 mg/dL

## 2022-01-07 LAB — POCT I-STAT, CHEM 8
BUN: 44 mg/dL — ABNORMAL HIGH (ref 6–20)
Calcium, Ion: 1.09 mmol/L — ABNORMAL LOW (ref 1.15–1.40)
Chloride: 93 mmol/L — ABNORMAL LOW (ref 98–111)
Creatinine, Ser: 8.5 mg/dL — ABNORMAL HIGH (ref 0.61–1.24)
Glucose, Bld: 143 mg/dL — ABNORMAL HIGH (ref 70–99)
HCT: 30 % — ABNORMAL LOW (ref 39.0–52.0)
Hemoglobin: 10.2 g/dL — ABNORMAL LOW (ref 13.0–17.0)
Potassium: 3.9 mmol/L (ref 3.5–5.1)
Sodium: 133 mmol/L — ABNORMAL LOW (ref 135–145)
TCO2: 30 mmol/L (ref 22–32)

## 2022-01-07 LAB — POCT ACTIVATED CLOTTING TIME: Activated Clotting Time: 155 seconds

## 2022-01-07 SURGERY — AORTIC ARCH ANGIOGRAPHY
Anesthesia: LOCAL

## 2022-01-07 MED ORDER — ACETAMINOPHEN 500 MG PO TABS: 500.0000 mg | ORAL_TABLET | Freq: Four times a day (QID) | ORAL | Status: DC | PRN

## 2022-01-07 MED ORDER — FENTANYL CITRATE (PF) 100 MCG/2ML IJ SOLN
INTRAMUSCULAR | Status: DC | PRN
  Administered 2022-01-07: 50 ug via INTRAVENOUS

## 2022-01-07 MED ORDER — HYDROMORPHONE HCL 1 MG/ML IJ SOLN
0.5000 mg | Freq: Three times a day (TID) | INTRAMUSCULAR | Status: DC | PRN
  Administered 2022-01-08: 1 mg via INTRAVENOUS
  Filled 2022-01-07: qty 1

## 2022-01-07 MED ORDER — SODIUM CHLORIDE 0.9% FLUSH
3.0000 mL | Freq: Two times a day (BID) | INTRAVENOUS | Status: DC
  Administered 2022-01-07 – 2022-01-09 (×2): 3 mL via INTRAVENOUS

## 2022-01-07 MED ORDER — CHLORHEXIDINE GLUCONATE CLOTH 2 % EX PADS
6.0000 | MEDICATED_PAD | Freq: Every day | CUTANEOUS | Status: DC
  Administered 2022-01-08 – 2022-01-13 (×4): 6 via TOPICAL

## 2022-01-07 MED ORDER — MORPHINE SULFATE (PF) 2 MG/ML IV SOLN
2.0000 mg | INTRAVENOUS | Status: AC
  Filled 2022-01-07: qty 1

## 2022-01-07 MED ORDER — INSULIN ASPART 100 UNIT/ML IJ SOLN: 0.0000 [IU] | Freq: Every day | INTRAMUSCULAR | Status: DC

## 2022-01-07 MED ORDER — GABAPENTIN 100 MG PO CAPS
100.0000 mg | ORAL_CAPSULE | Freq: Three times a day (TID) | ORAL | Status: DC
  Administered 2022-01-07 – 2022-01-09 (×4): 100 mg via ORAL
  Filled 2022-01-07 (×4): qty 1

## 2022-01-07 MED ORDER — HEPARIN (PORCINE) IN NACL 1000-0.9 UT/500ML-% IV SOLN
INTRAVENOUS | Status: DC | PRN
  Administered 2022-01-07 (×2): 500 mL

## 2022-01-07 MED ORDER — LIDOCAINE HCL (PF) 1 % IJ SOLN
INTRAMUSCULAR | Status: AC
  Filled 2022-01-07: qty 30

## 2022-01-07 MED ORDER — ACETAMINOPHEN 325 MG PO TABS
650.0000 mg | ORAL_TABLET | ORAL | Status: DC | PRN
  Administered 2022-01-08 – 2022-01-09 (×3): 650 mg via ORAL
  Filled 2022-01-07 (×3): qty 2

## 2022-01-07 MED ORDER — INSULIN ASPART 100 UNIT/ML IJ SOLN
0.0000 [IU] | Freq: Three times a day (TID) | INTRAMUSCULAR | Status: DC
  Administered 2022-01-09: 1 [IU] via SUBCUTANEOUS

## 2022-01-07 MED ORDER — SODIUM CHLORIDE 0.9 % IV SOLN
250.0000 mL | INTRAVENOUS | Status: DC | PRN
Start: 2022-01-07 — End: 2022-01-07

## 2022-01-07 MED ORDER — LINACLOTIDE 145 MCG PO CAPS: 145.0000 ug | ORAL_CAPSULE | Freq: Every day | ORAL | Status: DC

## 2022-01-07 MED ORDER — IODIXANOL 320 MG/ML IV SOLN
INTRAVENOUS | Status: DC | PRN
  Administered 2022-01-07: 175 mL

## 2022-01-07 MED ORDER — SUCROFERRIC OXYHYDROXIDE 500 MG PO CHEW
1000.0000 mg | CHEWABLE_TABLET | Freq: Three times a day (TID) | ORAL | Status: DC
  Administered 2022-01-09 – 2022-01-16 (×18): 1000 mg via ORAL
  Filled 2022-01-07 (×31): qty 2

## 2022-01-07 MED ORDER — ATORVASTATIN CALCIUM 80 MG PO TABS
80.0000 mg | ORAL_TABLET | Freq: Every day | ORAL | Status: DC
  Administered 2022-01-07 – 2022-01-16 (×9): 80 mg via ORAL
  Filled 2022-01-07 (×9): qty 1

## 2022-01-07 MED ORDER — LABETALOL HCL 5 MG/ML IV SOLN: 10.0000 mg | INTRAVENOUS | Status: DC | PRN

## 2022-01-07 MED ORDER — INSULIN ASPART PROT & ASPART (70-30 MIX) 100 UNIT/ML ~~LOC~~ SUSP
30.0000 [IU] | Freq: Two times a day (BID) | SUBCUTANEOUS | Status: DC
  Filled 2022-01-07: qty 10

## 2022-01-07 MED ORDER — LIDOCAINE-PRILOCAINE 2.5-2.5 % EX CREA: 1.0000 "application " | TOPICAL_CREAM | CUTANEOUS | Status: DC

## 2022-01-07 MED ORDER — ASPIRIN 81 MG PO TBEC
81.0000 mg | DELAYED_RELEASE_TABLET | Freq: Every day | ORAL | Status: DC
  Administered 2022-01-09 – 2022-01-16 (×8): 81 mg via ORAL
  Filled 2022-01-07 (×8): qty 1

## 2022-01-07 MED ORDER — MORPHINE SULFATE (PF) 2 MG/ML IV SOLN
2.0000 mg | INTRAVENOUS | Status: DC | PRN
  Administered 2022-01-07: 2 mg via INTRAVENOUS

## 2022-01-07 MED ORDER — TRAMADOL HCL 50 MG PO TABS
50.0000 mg | ORAL_TABLET | Freq: Four times a day (QID) | ORAL | Status: DC | PRN
Start: 2022-01-07 — End: 2022-01-08
  Administered 2022-01-08: 50 mg via ORAL
  Filled 2022-01-07: qty 1

## 2022-01-07 MED ORDER — HYDROXYZINE HCL 10 MG PO TABS
10.0000 mg | ORAL_TABLET | Freq: Three times a day (TID) | ORAL | Status: DC | PRN
  Filled 2022-01-07: qty 1

## 2022-01-07 MED ORDER — SODIUM CHLORIDE 0.9% FLUSH: 3.0000 mL | INTRAVENOUS | Status: DC | PRN

## 2022-01-07 MED ORDER — HEPARIN SODIUM (PORCINE) 5000 UNIT/ML IJ SOLN
5000.0000 [IU] | Freq: Three times a day (TID) | INTRAMUSCULAR | Status: DC
  Administered 2022-01-07 – 2022-01-16 (×23): 5000 [IU] via SUBCUTANEOUS
  Filled 2022-01-07 (×24): qty 1

## 2022-01-07 MED ORDER — LINACLOTIDE 72 MCG PO CAPS
72.0000 ug | ORAL_CAPSULE | Freq: Every day | ORAL | Status: DC
  Administered 2022-01-08 – 2022-01-16 (×9): 72 ug via ORAL
  Filled 2022-01-07 (×11): qty 1

## 2022-01-07 MED ORDER — MORPHINE SULFATE (PF) 2 MG/ML IV SOLN
2.0000 mg | Freq: Once | INTRAVENOUS | Status: AC
  Administered 2022-01-07: 2 mg via INTRAVENOUS

## 2022-01-07 MED ORDER — ONDANSETRON HCL 4 MG PO TABS: 4.0000 mg | ORAL_TABLET | Freq: Four times a day (QID) | ORAL | Status: DC | PRN

## 2022-01-07 MED ORDER — ONDANSETRON HCL 4 MG/2ML IJ SOLN: 4.0000 mg | Freq: Four times a day (QID) | INTRAMUSCULAR | Status: DC | PRN

## 2022-01-07 MED ORDER — HEPARIN SODIUM (PORCINE) 1000 UNIT/ML IJ SOLN
INTRAMUSCULAR | Status: DC | PRN
  Administered 2022-01-07: 3000 [IU] via INTRAVENOUS

## 2022-01-07 MED ORDER — CLOPIDOGREL BISULFATE 75 MG PO TABS
75.0000 mg | ORAL_TABLET | Freq: Every day | ORAL | Status: DC
  Administered 2022-01-07 – 2022-01-16 (×9): 75 mg via ORAL
  Filled 2022-01-07 (×9): qty 1

## 2022-01-07 MED ORDER — SODIUM CHLORIDE 0.9% FLUSH: 3.0000 mL | Freq: Two times a day (BID) | INTRAVENOUS | Status: DC

## 2022-01-07 MED ORDER — PANTOPRAZOLE SODIUM 40 MG PO TBEC
40.0000 mg | DELAYED_RELEASE_TABLET | Freq: Every day | ORAL | Status: DC
  Administered 2022-01-07 – 2022-01-09 (×2): 40 mg via ORAL
  Filled 2022-01-07 (×2): qty 1

## 2022-01-07 MED ORDER — FENTANYL CITRATE (PF) 100 MCG/2ML IJ SOLN
INTRAMUSCULAR | Status: AC
  Filled 2022-01-07: qty 2

## 2022-01-07 MED ORDER — BISACODYL 5 MG PO TBEC: 5.0000 mg | DELAYED_RELEASE_TABLET | Freq: Every day | ORAL | Status: DC | PRN

## 2022-01-07 MED ORDER — INSULIN ASPART PROT & ASPART (70-30 MIX) 100 UNIT/ML PEN: 30.0000 [IU] | PEN_INJECTOR | Freq: Two times a day (BID) | SUBCUTANEOUS | Status: DC

## 2022-01-07 MED ORDER — LIDOCAINE HCL (PF) 1 % IJ SOLN
INTRAMUSCULAR | Status: DC | PRN
  Administered 2022-01-07: 15 mL

## 2022-01-07 MED ORDER — HYDRALAZINE HCL 20 MG/ML IJ SOLN: 5.0000 mg | INTRAMUSCULAR | Status: DC | PRN

## 2022-01-07 MED ORDER — BISMUTH SUBSALICYLATE 262 MG/15ML PO SUSP: 30.0000 mL | Freq: Four times a day (QID) | ORAL | Status: DC | PRN

## 2022-01-07 MED ORDER — HEPARIN SODIUM (PORCINE) 1000 UNIT/ML IJ SOLN
INTRAMUSCULAR | Status: AC
  Filled 2022-01-07: qty 10

## 2022-01-07 MED ORDER — HEPARIN SODIUM (PORCINE) 1000 UNIT/ML DIALYSIS: 1500.0000 [IU] | INTRAMUSCULAR | Status: DC | PRN

## 2022-01-07 MED ORDER — OXYCODONE HCL 5 MG PO TABS
5.0000 mg | ORAL_TABLET | ORAL | Status: DC | PRN
  Administered 2022-01-07 – 2022-01-08 (×3): 10 mg via ORAL
  Administered 2022-01-08: 5 mg via ORAL
  Filled 2022-01-07 (×4): qty 2

## 2022-01-07 MED ORDER — MORPHINE SULFATE (PF) 2 MG/ML IV SOLN
INTRAVENOUS | Status: AC
  Administered 2022-01-07: 2 mg via INTRAVENOUS
  Filled 2022-01-07: qty 1

## 2022-01-07 MED ORDER — POLYETHYLENE GLYCOL 3350 17 G PO PACK
17.0000 g | PACK | Freq: Every day | ORAL | Status: DC
  Administered 2022-01-09 – 2022-01-16 (×3): 17 g via ORAL
  Filled 2022-01-07 (×6): qty 1

## 2022-01-07 MED ORDER — SODIUM CHLORIDE 0.9 % IV SOLN: 250.0000 mL | INTRAVENOUS | Status: DC | PRN

## 2022-01-07 MED ORDER — HEPARIN (PORCINE) IN NACL 1000-0.9 UT/500ML-% IV SOLN
INTRAVENOUS | Status: AC
  Filled 2022-01-07: qty 1000

## 2022-01-07 MED ORDER — MORPHINE SULFATE (PF) 2 MG/ML IV SOLN
INTRAVENOUS | Status: AC
  Filled 2022-01-07: qty 1

## 2022-01-07 SURGICAL SUPPLY — 11 items
CATH ANGIO 5F BER2 100CM (CATHETERS) ×1 IMPLANT
CATH ANGIO 5F PIGTAIL 100CM (CATHETERS) ×1 IMPLANT
CATH OMNI FLUSH 5F 65CM (CATHETERS) ×1 IMPLANT
GLIDEWIRE ADV .035X260CM (WIRE) ×1 IMPLANT
KIT MICROPUNCTURE NIT STIFF (SHEATH) ×1 IMPLANT
KIT PV (KITS) ×4 IMPLANT
SHEATH PINNACLE 5F 10CM (SHEATH) ×1 IMPLANT
SYR MEDRAD MARK V 150ML (SYRINGE) ×1 IMPLANT
TRANSDUCER W/STOPCOCK (MISCELLANEOUS) ×3 IMPLANT
TRAY PV CATH (CUSTOM PROCEDURE TRAY) ×3 IMPLANT
WIRE BENTSON .035X145CM (WIRE) ×1 IMPLANT

## 2022-01-07 NOTE — Consult Note (Signed)
Renal Service Consult Note [facility]  Edward [name] 01/07/2022 Sol Blazing, MD Requesting Physician: Dr. Donzetta Matters   Reason for Consult: ESRD pt w/ necrotic R BKA stump HPI: The patient is a 51 y.o. year-old w/ hx of anemia, anxiety, DJD, CHF, ESRD on HD, CAD, depression, Dm2, GERD, HTN, OSA who presented for aortogram yesterday 5/22 by [facility] due to necrotic lesions on the R BKA stump, also necrosis of some of his fingers. Pt is to go for R BK > AKA revision today. Asked to see for ESRD.   Pt seen in HD. No c/o's today. No sig pain issues. Lives w/ his sons at home in [place], they are in there 20's.  Good compliance w/ OP HD.   ROS - denies CP, no joint pain, no HA, no blurry vision, no rash, no diarrhea, no nausea/ vomiting, no dysuria, no difficulty voiding   Past Medical History  Past Medical History:  Diagnosis Date   Allergy    Anemia    getting Venofer with dialysis   Anxiety    self reported, sometimes-situational anxiety   Arthritis    bilateral hands/LEFT hip   CHF (congestive heart failure) (HCC)    Chronic kidney disease    t th s dialysis- [place] ---ESRD   Complication of anesthesia    slow to go to sleep   Coronary artery disease    Depression    self reported, sometimes-situational depression   Diabetes mellitus    type II- on meds   Dyspnea    sometimes- fluid   GERD (gastroesophageal reflux disease)    on meds   Hypertension    no longer on medication since starting on Hemodialysis   Myocardial infarction [facility]) 2019   Sleep apnea    Past Surgical History  Past Surgical History:  Procedure Laterality Date   A/V FISTULAGRAM Left 03/06/2020   Procedure: A/V FISTULAGRAM;  Surgeon: Waynetta Sandy, MD;  Location: [place] [facility];  Service: Cardiovascular;  Laterality: Left;   ABDOMINAL AORTOGRAM W/LOWER EXTREMITY N/A 04/09/2021   Procedure: ABDOMINAL AORTOGRAM W/LOWER EXTREMITY;  Surgeon: Waynetta Sandy, MD;  Location: [place] [facility];  Service: Cardiovascular;  Laterality: N/A;   AMPUTATION Left 05/10/2016   Procedure: Left Transmetatarsal Amputation;  Surgeon: [name], MD;  Location: [place];  Service: Orthopedics;  Laterality: Left;   AMPUTATION Right 05/16/2018   Procedure: PARTIAL RAY AMPUTATION FIFTH TOE RIGHT FOOT;  Surgeon: [name], DPM;  Location: [place];  Service: Podiatry;  Laterality: Right;   AMPUTATION Right 06/17/2018   Procedure: AMPUTATION 4th RAY Right Foot;  Surgeon: Trula Slade, DPM;  Location: [place];  Service: Podiatry;  Laterality: Right;   AMPUTATION Right 06/19/2018   Procedure: RIGHT BELOW KNEE AMPUTATION;  Surgeon: [name], MD;  Location: [place];  Service: Orthopedics;  Laterality: Right;   AMPUTATION Left 10/05/2021   Procedure: LEFT RING FINGER AMPUTATION;  Surgeon: Daryll Brod, MD;  Location: [place];  Service: Orthopedics;  Laterality: Left;  45 MIN   AMPUTATION Left 11/15/2021   Procedure: LEFT INDEX FINGER AMPUTATION;  Surgeon: Leanora Cover, MD;  Location: [place];  Service: Orthopedics;  Laterality: Left;  60 MIN   APPLICATION OF WOUND VAC Right 06/19/2018   Procedure: APPLICATION OF WOUND VAC;  Surgeon: [name], MD;  Location: [place];  Service: Orthopedics;  Laterality: Right;   AV FISTULA PLACEMENT Left 11/25/2019   Procedure: LEFT ARM Brachiocephalic  ARTERIOVENOUS (AV) FISTULA CREATION;  Surgeon: Rosetta Posner, MD;  Location: [place];  Service: Vascular;  Laterality: Left;   [place] Left 07/19/2020   Procedure: LEFT SECOND STAGE [place];  Surgeon: Waynetta Sandy, MD;  Location: [place];  Service: Vascular;  Laterality: Left;   CARDIAC CATHETERIZATION  07/29/2018   CIRCUMCISION  09/02/2011   Procedure: CIRCUMCISION ADULT;  Surgeon: Molli Hazard, MD;  Location: [place];  Service: Urology;  Laterality: N/A;   CORONARY STENT INTERVENTION N/A 07/29/2018   Procedure: CORONARY STENT  INTERVENTION;  Surgeon: Leonie Man, MD;  Location: [place] [facility];  Service: Cardiovascular;  Laterality: N/A;   DEBRIDEMENT AND CLOSURE WOUND Left 11/15/2021   Procedure: LEFT HAND WOUND CLOSURE;  Surgeon: Leanora Cover, MD;  Location: [place];  Service: Orthopedics;  Laterality: Left;   I & D EXTREMITY  09/02/2011   Procedure: IRRIGATION AND DEBRIDEMENT EXTREMITY;  Surgeon: Theodoro Kos, DO;  Location: [place];  Service: Plastics;  Laterality: N/A;   INCISION AND DRAINAGE OF WOUND  08/12/2011   Procedure: IRRIGATION AND DEBRIDEMENT WOUND;  Surgeon: Zenovia Jarred, MD;  Location: [place];  Service: General;;  perineum   INSERTION OF DIALYSIS CATHETER N/A 11/25/2019   Procedure: INSERTION OF Righ Internal Jugular DIALYSIS CATHETER;  Surgeon: Rosetta Posner, MD;  Location: [place];  Service: Vascular;  Laterality: N/A;   [place]  08/12/2011   Procedure: IRRIGATION AND DEBRIDEMENT ABSCESS;  Surgeon: Molli Hazard, MD;  Location: [place];  Service: Urology;  Laterality: N/A;  irrigation and debridment perineum     IRRIGATION AND DEBRIDEMENT ABSCESS  08/14/2011   Procedure: MINOR INCISION AND DRAINAGE OF ABSCESS;  Surgeon: Molli Hazard, MD;  Location: [place];  Service: Urology;  Laterality: N/A;  Perineal Wound Debridement;Placement Bilateral Testicular Thigh Pouches   LEFT HEART CATH AND CORONARY ANGIOGRAPHY N/A 07/29/2018   Procedure: LEFT HEART CATH AND CORONARY ANGIOGRAPHY;  Surgeon: Leonie Man, MD;  Location: [place] [facility];  Service: Cardiovascular;  Laterality: N/A;   PERIPHERAL VASCULAR ATHERECTOMY Left 04/09/2021   Procedure: PERIPHERAL VASCULAR ATHERECTOMY;  Surgeon: Waynetta Sandy, MD;  Location: [place] [facility];  Service: Cardiovascular;  Laterality: Left;  TP trunk and peroneal arteries   PERIPHERAL VASCULAR BALLOON ANGIOPLASTY Left 04/09/2021   Procedure: PERIPHERAL VASCULAR BALLOON ANGIOPLASTY;  Surgeon:  Waynetta Sandy, MD;  Location: [place] [facility];  Service: Cardiovascular;  Laterality: Left;  Popliteal   REVISON OF ARTERIOVENOUS FISTULA Left 05/24/2020   Procedure: LEFT ARM ARTERIOVENOUS FISTULA  CONVERSION TO BASILIC VEIN FISTULA;  Surgeon: Waynetta Sandy, MD;  Location: [place];  Service: Vascular;  Laterality: Left;   TOTAL HIP ARTHROPLASTY Left 10/11/2016   Procedure: LEFT TOTAL HIP ARTHROPLASTY ANTERIOR APPROACH;  Surgeon: Mcarthur Rossetti, MD;  Location: [facility];  Service: Orthopedics;  Laterality: Left;   Family History  Family History  Problem Relation Age of Onset   Diabetes Other    Pancreatic cancer Mother 76   Colon cancer Neg Hx    Colon polyps Neg Hx    Esophageal cancer Neg Hx    Rectal cancer Neg Hx    Stomach cancer Neg Hx    Social History  reports that he quit smoking about 2 years ago. His smoking use included cigarettes. He has a 6.00 pack-year smoking history. He has never used smokeless tobacco. He reports that he does not  currently use alcohol. He reports that he does not use drugs. Allergies  Allergies  Allergen Reactions   Crestor [Rosuvastatin] Rash    Also had a red eye   Dilaudid [Hydromorphone] Itching   Benadryl [Diphenhydramine] Rash   Home medications Prior to Admission medications   Medication Sig Start Date End Date Taking? Authorizing Provider  acetaminophen (TYLENOL) 500 MG tablet Take 500-1,000 mg by mouth every 6 (six) hours as needed for moderate pain or headache.   Yes [provider]  aspirin EC 81 MG tablet Take 1 tablet (81 mg total) by mouth daily. Swallow whole. 03/14/20  Yes Lelon Perla, MD  atorvastatin (LIPITOR) 80 MG tablet Take 80 mg by mouth daily.   Yes [provider]  clopidogrel (PLAVIX) 75 MG tablet Take 1 tablet (75 mg total) by mouth daily. 04/09/21 04/09/22 Yes Waynetta Sandy, MD  gabapentin (NEURONTIN) 100 MG capsule Take 100 mg by mouth 3 (three) times daily.  12/03/21  Yes [provider]  insulin aspart protamine - aspart (NOVOLOG MIX 70/30 FLEXPEN) (70-30) 100 UNIT/ML FlexPen Inject 0.3 mLs (30 Units total) into the skin 2 (two) times daily with a meal. 09/29/20  Yes Lavina Hamman, MD  lidocaine-prilocaine (EMLA) cream Apply 1 application topically See admin instructions. Apply a small amount to access site (AVF) 1-2 hours before dialysis. Cover with occlusive dressing (saran wrap). Tuesday,Thursday,saturday 03/22/21  Yes [provider]  pantoprazole (PROTONIX) 40 MG tablet Take 1 tablet (40 mg total) by mouth in the morning and at bedtime. Patient taking differently: Take 40 mg by mouth daily. 08/29/21  Yes Willia Craze, NP  traMADol (ULTRAM) 50 MG tablet Take 50 mg by mouth 2 (two) times daily as needed. 12/31/21  Yes [provider]  VELPHORO 500 MG chewable tablet Chew 1,000 mg by mouth 3 (three) times daily with meals. 06/14/20  Yes [provider]  bismuth subsalicylate (PEPTO BISMOL) 262 MG/15ML suspension Take 30 mLs by mouth every 6 (six) hours as needed for indigestion.    [provider]  blood glucose meter kit and supplies Dispense based on patient and insurance preference. Use up to four times daily as directed. (FOR ICD-9 250.00, 250.01). 07/01/18   Angiulli, Lavon Paganini, PA-C  Insulin Pen Needle 31G X 5 MM MISC Use daily to inject insulin as instructed 07/01/18   Angiulli, Lavon Paganini, PA-C  linaclotide [facility]) 145 MCG CAPS capsule Take 1 capsule (145 mcg total) by mouth daily before breakfast. Patient not taking: Reported on 09/21/2021 12/13/20   Yetta Flock, MD  linaclotide [facility]) 72 MCG capsule Take 1 capsule (72 mcg total) by mouth daily before breakfast. Patient taking differently: Take 72 mcg by mouth daily as needed (constipation). 12/13/20   Armbruster, Carlota Raspberry, MD  polyethylene glycol (MIRALAX) 17 g packet Take 17 g by mouth daily. Patient not taking: Reported on 09/21/2021  12/13/20   Yetta Flock, MD  predniSONE (DELTASONE) 20 MG tablet Take 1 tablet by mouth in the morning and at bedtime. Patient not taking: Reported on 09/21/2021 08/14/21   [provider]  triamcinolone ointment (KENALOG) 0.1 % 1 application. daily as needed (rash). 09/04/21   [provider]     Vitals:   01/07/22 1607 01/07/22 1612 01/07/22 1617 01/07/22 1634  BP:    137/73  Pulse: 85 84 (!) 0 86  Resp: 14 19  (!) 9  Temp:      TempSrc:  SpO2: (!) 82% (!) 46%  (!) 56%  Weight:      Height:       Exam Gen alert, no distress No rash, cyanosis or gangrene Sclera anicteric, throat clear  No jvd or bruits Chest clear bilat to bases, no rales/ wheezing RRR no RG Abd soft ntnd no mass or ascites +bs GU normal male MS no joint effusions or deformity Ext R BKA w/ dried eschar diffusely about the stump, no LE or UE edema Neuro is alert, Ox 3 , nf    LUA AVF+bruit      Home meds include - aspirin, peptobismol, clopidogrel, gabapentin, insulin aspart protamine, emla cream, linaclotide, pantoprazole, polyethylene glycol, velphoro 1000 ac tid     OP HD: [facility]  4.5h  425/700  94kg  2/2 bath  LUA AVF   Heparin 8000+ 2054mdrun prn - venofer 100 mg tiw IV , 5/04- 5/25 - mircera 30 ug q4 wks, last 5/4, due early June - hectorol 11 ug tiw IV - last HD 5/20, 93.3 > 92.0 kg - Hb 9.9 on 5/18   Assessment/ Plan: R BKA stump pain/ severe PAD - for revision to AKA later today per [facility] ESRD - on HD TTS. Is getting HD now in HD unit.  Hyperkalemia - mild, rx w/ HD today Severe PAD - sp L TMA, multiple amputations of L hand CAD/ ICM / combined CHF - EF 40-45% DM2 - per pmd BP/ vol - BP's soft to normal. 2-3 kg over, UF same w/ HD as tolerated Anemia esrd - Hb 10.2. No esa needs. Will give 2 more doses IV Fe to complete 1 gm load.  MBD ckd - Ca in range, add on phos/ alb. Cont velphoro as binder and IV vdra.       RKelly Splinter MD 01/07/2022, 4:47  PM Recent Labs  Lab 01/07/22 1137  HGB 10.2*  CREATININE 8.50*  K 3.9

## 2022-01-07 NOTE — Progress Notes (Signed)
22f right femoral arterial sheath aspirated and removed by GAzzie Glatter RN.  Manual pressure held for 15 minutes, hemostasis achieved.  Site level 0.  Tegaderm and gauze dressing applied, instructions given to pt.  Pt verbalized understanding.   Bedrest begins 17:10.

## 2022-01-07 NOTE — H&P (Signed)
TRH H&P   Patient Demographics:    Edward [name], is a 51 y.o. male  MRN: [id number]   DOB - May 16, 1971  Admit Date - 01/07/2022  Outpatient Primary MD for the patient is [name], MD  Outpatient Specialists: Dr Donzetta Matters, Dr Justin Mend    Patient coming from: Cath Lab     HPI:    Edward [name]  is a 51 y.o. male, history of severe PAD s/p left transmetatarsal amputation, right BKA with necrotic stump, multiple amputation in left hand fingers, ESRD on TTS schedule for dialysis, DM type 2 insulin-dependent, dyslipidemia, CAD with MI in past, combined systolic and diastolic CHF EF 09%, orthostatic hypotension of chronic disease, AAA, recent Zio patch placement, who was brought in to the Cath Lab for elective bilateral lower extremity angiogram which was done by Dr. Donzetta Matters to evaluate ongoing right BKA stump pain and necrosis, postprocedure hospitalist team was requested to admit the patient as he may require further revision of his right lower extremity stump.  Patient presently is in the Cath Lab and relatively symptom-free except for ongoing right BKA stump site pain which has been bothering him for several weeks, he denies any fever chills, no headache, no chest pain or shortness of breath, no cough, no abdominal pain or discomfort, no blood in stool or urine.  No dysuria or new focal weakness.    Review of systems:    IA full 10 point Review of Systems was done, except as stated above, all other Review of Systems were negative.   With Past History of the following :    Past Medical History:  Diagnosis Date   Allergy    Anemia    getting Venofer with dialysis   Anxiety    self reported, sometimes-situational anxiety    Arthritis    bilateral hands/LEFT hip   CHF (congestive heart failure) (HCC)    Chronic kidney disease    t th s dialysis- [place] ---ESRD   Complication of anesthesia    slow to go to sleep   Coronary artery disease    Depression    self reported, sometimes-situational depression   Diabetes mellitus    type II- on meds   Dyspnea    sometimes- fluid   GERD (gastroesophageal reflux disease)    on meds   Hypertension  no longer on medication since starting on Hemodialysis   Myocardial infarction [facility]) 2019   Sleep apnea       Past Surgical History:  Procedure Laterality Date   A/V FISTULAGRAM Left 03/06/2020   Procedure: A/V FISTULAGRAM;  Surgeon: Waynetta Sandy, MD;  Location: [place] [facility];  Service: Cardiovascular;  Laterality: Left;   ABDOMINAL AORTOGRAM W/LOWER EXTREMITY N/A 04/09/2021   Procedure: ABDOMINAL AORTOGRAM W/LOWER EXTREMITY;  Surgeon: Waynetta Sandy, MD;  Location: [place] [facility];  Service: Cardiovascular;  Laterality: N/A;   AMPUTATION Left 05/10/2016   Procedure: Left Transmetatarsal Amputation;  Surgeon: [name], MD;  Location: [place];  Service: Orthopedics;  Laterality: Left;   AMPUTATION Right 05/16/2018   Procedure: PARTIAL RAY AMPUTATION FIFTH TOE RIGHT FOOT;  Surgeon: [name], DPM;  Location: [place];  Service: Podiatry;  Laterality: Right;   AMPUTATION Right 06/17/2018   Procedure: AMPUTATION 4th RAY Right Foot;  Surgeon: Trula Slade, DPM;  Location: [place];  Service: Podiatry;  Laterality: Right;   AMPUTATION Right 06/19/2018   Procedure: RIGHT BELOW KNEE AMPUTATION;  Surgeon: [name], MD;  Location: [place];  Service: Orthopedics;  Laterality: Right;   AMPUTATION Left 10/05/2021   Procedure: LEFT RING FINGER AMPUTATION;  Surgeon: Daryll Brod, MD;  Location: [place];  Service: Orthopedics;  Laterality: Left;  45 MIN   AMPUTATION Left 11/15/2021   Procedure: LEFT INDEX FINGER AMPUTATION;  Surgeon: Leanora Cover, MD;  Location: [place];  Service: Orthopedics;  Laterality: Left;  60 MIN   APPLICATION OF WOUND VAC Right 06/19/2018   Procedure: APPLICATION OF WOUND VAC;  Surgeon: [name], MD;  Location: [name];  Service: Orthopedics;  Laterality: Right;   AV FISTULA PLACEMENT Left 11/25/2019   Procedure: LEFT ARM Brachiocephalic  ARTERIOVENOUS (AV) FISTULA CREATION;  Surgeon: Rosetta Posner, MD;  Location: [place];  Service: Vascular;  Laterality: Left;   [place] Left 07/19/2020   Procedure: LEFT SECOND STAGE [place];  Surgeon: Waynetta Sandy, MD;  Location: [place];  Service: Vascular;  Laterality: Left;   CARDIAC CATHETERIZATION  07/29/2018   CIRCUMCISION  09/02/2011   Procedure: CIRCUMCISION ADULT;  Surgeon: Molli Hazard, MD;  Location: [place];  Service: Urology;  Laterality: N/A;   CORONARY STENT INTERVENTION N/A 07/29/2018   Procedure: CORONARY STENT INTERVENTION;  Surgeon: Leonie Man, MD;  Location: [place] [facility];  Service: Cardiovascular;  Laterality: N/A;   DEBRIDEMENT AND CLOSURE WOUND Left 11/15/2021   Procedure: LEFT HAND WOUND CLOSURE;  Surgeon: Leanora Cover, MD;  Location: [place];  Service: Orthopedics;  Laterality: Left;   I & D EXTREMITY  09/02/2011   Procedure: IRRIGATION AND DEBRIDEMENT EXTREMITY;  Surgeon: Theodoro Kos, DO;  Location: [place];  Service: Plastics;  Laterality: N/A;   INCISION AND DRAINAGE OF WOUND  08/12/2011   Procedure: IRRIGATION AND DEBRIDEMENT WOUND;  Surgeon: Zenovia Jarred, MD;  Location: [place];  Service: General;;  perineum   INSERTION OF DIALYSIS CATHETER N/A 11/25/2019   Procedure: INSERTION OF Righ Internal Jugular DIALYSIS CATHETER;  Surgeon: Rosetta Posner, MD;  Location: [place];  Service: Vascular;  Laterality: N/A;   [place]  08/12/2011   Procedure: IRRIGATION AND DEBRIDEMENT ABSCESS;  Surgeon: Molli Hazard, MD;   Location: [place];  Service: Urology;  Laterality: N/A;  irrigation and debridment perineum     IRRIGATION  AND DEBRIDEMENT ABSCESS  08/14/2011   Procedure: MINOR INCISION AND DRAINAGE OF ABSCESS;  Surgeon: Molli Hazard, MD;  Location: [place];  Service: Urology;  Laterality: N/A;  Perineal Wound Debridement;Placement Bilateral Testicular Thigh Pouches   LEFT HEART CATH AND CORONARY ANGIOGRAPHY N/A 07/29/2018   Procedure: LEFT HEART CATH AND CORONARY ANGIOGRAPHY;  Surgeon: Leonie Man, MD;  Location: [place] [facility];  Service: Cardiovascular;  Laterality: N/A;   PERIPHERAL VASCULAR ATHERECTOMY Left 04/09/2021   Procedure: PERIPHERAL VASCULAR ATHERECTOMY;  Surgeon: Waynetta Sandy, MD;  Location: [place] [facility];  Service: Cardiovascular;  Laterality: Left;  TP trunk and peroneal arteries   PERIPHERAL VASCULAR BALLOON ANGIOPLASTY Left 04/09/2021   Procedure: PERIPHERAL VASCULAR BALLOON ANGIOPLASTY;  Surgeon: Waynetta Sandy, MD;  Location: [place] [facility];  Service: Cardiovascular;  Laterality: Left;  Popliteal   REVISON OF ARTERIOVENOUS FISTULA Left 05/24/2020   Procedure: LEFT ARM ARTERIOVENOUS FISTULA  CONVERSION TO BASILIC VEIN FISTULA;  Surgeon: Waynetta Sandy, MD;  Location: [place];  Service: Vascular;  Laterality: Left;   TOTAL HIP ARTHROPLASTY Left 10/11/2016   Procedure: LEFT TOTAL HIP ARTHROPLASTY ANTERIOR APPROACH;  Surgeon: Mcarthur Rossetti, MD;  Location: [facility];  Service: Orthopedics;  Laterality: Left;      Social History:     Social History   Tobacco Use   Smoking status: Former    Packs/day: 0.25    Years: 24.00    Pack years: 6.00    Types: Cigarettes    Quit date: 11/19/2019    Years since quitting: 2.1   Smokeless tobacco: Never  Substance Use Topics   Alcohol use: Not Currently    Alcohol/week: 0.0 standard drinks    Comment: quit 2018        Family History :     Family History  Problem Relation Age of Onset    Diabetes Other    Pancreatic cancer Mother 9   Colon cancer Neg Hx    Colon polyps Neg Hx    Esophageal cancer Neg Hx    Rectal cancer Neg Hx    Stomach cancer Neg Hx        Home Medications:   Prior to Admission medications   Medication Sig Start Date End Date Taking? Authorizing Provider  acetaminophen (TYLENOL) 500 MG tablet Take 500-1,000 mg by mouth every 6 (six) hours as needed for moderate pain or headache.   Yes [provider]  aspirin EC 81 MG tablet Take 1 tablet (81 mg total) by mouth daily. Swallow whole. 03/14/20  Yes Lelon Perla, MD  atorvastatin (LIPITOR) 80 MG tablet Take 80 mg by mouth daily.   Yes [provider]  clopidogrel (PLAVIX) 75 MG tablet Take 1 tablet (75 mg total) by mouth daily. 04/09/21 04/09/22 Yes Waynetta Sandy, MD  gabapentin (NEURONTIN) 100 MG capsule Take 100 mg by mouth 3 (three) times daily. 12/03/21  Yes [provider]  insulin aspart protamine - aspart (NOVOLOG MIX 70/30 FLEXPEN) (70-30) 100 UNIT/ML FlexPen Inject 0.3 mLs (30 Units total) into the skin 2 (two) times daily with a meal. 09/29/20  Yes Lavina Hamman, MD  lidocaine-prilocaine (EMLA) cream Apply 1 application topically See admin instructions. Apply a small amount to access site (AVF) 1-2 hours before dialysis. Cover with occlusive dressing (saran wrap). Tuesday,Thursday,saturday 03/22/21  Yes [provider]  pantoprazole (PROTONIX) 40 MG tablet Take 1 tablet (40 mg total) by mouth in the morning and at bedtime.  Patient taking differently: Take 40 mg by mouth daily. 08/29/21  Yes Willia Craze, NP  traMADol (ULTRAM) 50 MG tablet Take 50 mg by mouth 2 (two) times daily as needed. 12/31/21  Yes [provider]  VELPHORO 500 MG chewable tablet Chew 1,000 mg by mouth 3 (three) times daily with meals. 06/14/20  Yes [provider]  bismuth subsalicylate (PEPTO BISMOL) 262 MG/15ML suspension Take 30 mLs by mouth every 6  (six) hours as needed for indigestion.    [provider]  blood glucose meter kit and supplies Dispense based on patient and insurance preference. Use up to four times daily as directed. (FOR ICD-9 250.00, 250.01). 07/01/18   Angiulli, Lavon Paganini, PA-C  Insulin Pen Needle 31G X 5 MM MISC Use daily to inject insulin as instructed 07/01/18   Angiulli, Lavon Paganini, PA-C  linaclotide [facility]) 145 MCG CAPS capsule Take 1 capsule (145 mcg total) by mouth daily before breakfast. Patient not taking: Reported on 09/21/2021 12/13/20   Yetta Flock, MD  linaclotide [facility]) 72 MCG capsule Take 1 capsule (72 mcg total) by mouth daily before breakfast. Patient taking differently: Take 72 mcg by mouth daily as needed (constipation). 12/13/20   Armbruster, Carlota Raspberry, MD  polyethylene glycol (MIRALAX) 17 g packet Take 17 g by mouth daily. Patient not taking: Reported on 09/21/2021 12/13/20   Yetta Flock, MD  predniSONE (DELTASONE) 20 MG tablet Take 1 tablet by mouth in the morning and at bedtime. Patient not taking: Reported on 09/21/2021 08/14/21   [provider]  triamcinolone ointment (KENALOG) 0.1 % 1 application. daily as needed (rash). 09/04/21   [provider]     Allergies:     Allergies  Allergen Reactions   Crestor [Rosuvastatin] Rash    Also had a red eye   Dilaudid [Hydromorphone] Itching   Benadryl [Diphenhydramine] Rash     Physical Exam:   Vitals  Blood pressure 139/74, pulse 87, temperature 98.4 F (36.9 C), temperature source Oral, resp. rate (!) 23, height 6' 1"  (1.854 m), weight 104.3 kg, SpO2 99 %.   1. General -middle-aged African-American gentleman laying in PACU bed in no apparent discomfort,  2. Normal affect and insight, Not Suicidal or Homicidal, Awake Alert,   3. No F.N deficits, ALL C.Nerves Intact, Strength 5/5 all 4 extremities, Sensation intact all 4 extremities, Plantars down going.  4. Ears and Eyes appear Normal, Conjunctivae  clear, PERRLA. Moist Oral Mucosa.  5. Supple Neck, No JVD, No cervical lymphadenopathy appriciated, No Carotid Bruits.  6. Symmetrical Chest wall movement, Good air movement bilaterally, CTAB.  7. RRR, No Gallops, Rubs or Murmurs, No Parasternal Heave.  8. Positive Bowel Sounds, Abdomen Soft, No tenderness, No organomegaly appriciated,No rebound -guarding or rigidity.  9.  No Cyanosis, Normal Skin Turgor, No Skin Rash or Bruise.  Right BKA stump site under bandage, right femoral cath site stable under bandage, left transmetatarsal amputation old, multiple fingers in the left hand missing, wearing a splint in the right wrist, left arm AV fistula present  10. Good muscle tone,  joints appear normal , no effusions, Normal ROM.  11. No Palpable Lymph Nodes in Neck or Axillae      Data Review:    CBC Recent Labs  Lab 01/07/22 1137  HGB 10.2*  HCT 30.0*   ------------------------------------------------------------------------------------------------------------------  Chemistries  Recent Labs  Lab 01/07/22 1137  NA 133*  K 3.9  CL 93*  GLUCOSE 143*  BUN 44*  CREATININE  8.50*   ------------------------------------------------------------------------------------------------------------------ estimated creatinine clearance is 12.9 mL/min (A) (by C-G formula based on SCr of 8.5 mg/dL (H)). ------------------------------------------------------------------------------------------------------------------ No results for input(s): TSH, T4TOTAL, T3FREE, THYROIDAB in the last 72 hours.  Invalid input(s): FREET3  Coagulation profile No results for input(s): INR, PROTIME in the last 168 hours. ------------------------------------------------------------------------------------------------------------------- No results for input(s): DDIMER in the last 72  hours. -------------------------------------------------------------------------------------------------------------------  Cardiac Enzymes No results for input(s): CKMB, TROPONINI, MYOGLOBIN in the last 168 hours.  Invalid input(s): CK  ----------------------------------------------------------------------------------------------------------------   Imaging Results:    PERIPHERAL VASCULAR CATHETERIZATION  Result Date: 01/07/2022 Images from the original result were not included. Patient name: CRAIGE PATEL MRN: [id number] DOB: July 29, 1971 Sex: male 01/07/2022 Pre-operative Diagnosis: End-stage renal disease, necrotic bilateral fingers, necrotic right below-knee amputation, left lower extremity wound Post-operative diagnosis:  Same Surgeon:  Eda Paschal. Donzetta Matters, MD Procedure Performed: 1.  Ultrasound-guided cannulation right common femoral artery 2.  Arch aortogram 3.  Selection of bilateral axillary arteries and upper bilateral extremity angiography 4.  Bilateral lower extremity angiography Indications: 51 year old male with history of right below-knee amputation which is now healing.  He also has amputations of the left fingers and now with new necrosis of fingers on the right.  Wound on the left foot is healing but the right below-knee amputation is necrotic.  He is now indicated for angiography and possible intervention of all extremities. Findings: The arch of his aorta appears to be a type I arch.  The right upper extremity there is a small lesion in the brachial artery just after the takeoff of a high radial artery.  He does not have any ulnar artery flow to his right hand that is appreciable.  The radial artery has very sluggish flow down to the level of the wrist could not get any flow into the hand.  The interosseous artery flows toe about the level of the wrist and then stops.  No intervention was undertaken there.  On the left side there is no flow-limiting stenosis throughout the upper extremity  he does have radial artery dominance which goes to the level of the wrist at least but is difficult given the fistula.  If there is persistent wounds of the left hand we may need to consider ligation of the left arm fistula given that I do not think he would be a good candidate for any extensive revascularization of his left upper extremity.  The aorta in the abdomen is free of flow-limiting stenosis.  The SMA is patent.  Bilateral renal arteries consistent with dialysis.  Bilateral common iliac arteries are patent and there is disease in the bilateral hypogastric arteries but they are both patent.  On the right side the common femoral artery is patent with profunda flow in the SFA is patent down to the level of above the knee where there is no flow into the below-knee area consistent with necrosis of the below-knee amputation.  On the left side the common femoral and SFA are patent.  There is a 50% stenosis of the popliteal artery and then appears to have dominant flow via the peroneal artery to a transmetatarsal amputation on the left.  No intervention was undertaken. Plan will be for right above-knee amputation tomorrow in the OR.  Procedure:  The patient was identified in the holding area and taken to room 8.  The patient was then placed supine on the table and prepped and draped in the usual sterile fashion.  A time out was called.  Ultrasound  was used to evaluate the right common femoral artery.  The area was thickened but we are able to anesthetize this area to cannulate the artery with micropuncture needle followed by wire and sheath.  Images saved department record.  We placed a 5 French sheath over a Bentson wire followed by pigtail catheter to the aortic arch and patient is given 3000's of heparin.  We performed arch angiography then selected the bilateral subclavian and axillary arteries and performed bilateral upper extremity angiography without intervention.  We then put a pigtail in the abdominal  aorta performed bilateral lower extremity angiography after aortic angiography and there is no intervention there either.  Plan will be for right above-knee amputation tomorrow in the OR.  Catheter was removed over wire.  Sheath will be pulled in postoperative holding.  He tolerated procedure without any complication. Contrast: 170cc Brandon C. Donzetta Matters, MD [facility] Office: [phone] Pager: [place]:    1.  Right BKA stump site discomfort, history of severe PAD, possible revision of right BKA stump by Dr. Donzetta Matters later this week - he has undergone angiography of both lower extremities on 01/07/2022 by vascular surgery, currently on DAPT along with statin for secondary prevention, this will be continued, supportive care for pain control, further management of this issue will be deferred to vascular surgery.  2.  Severe PAD requiring right BKA, left transmetatarsal amputation, multiple amputations of left upper extremity fingers -for PAD DAPT and statin for secondary prevention.  Vascular surgery on board.  3.  ESRD.  On TTS schedule, has left arm AV fistula, renal consulted.  4.  CAD with ischemic cardiomyopathy.  No acute issues supportive care, continue DAPT with statin for secondary prevention, has chronic orthostatic hypotension precluding the use of beta-blockers.  5.  Chronic combined systolic and diastolic CHF with EF 72%.  Currently compensated, fluid removal via HD, blood pressure too low to allow beta-blocker, ACE/ARB use.  6.  Dyslipidemia.  On statin  7.  DM type II.  Insulin-dependent.  Home 7030 twice daily continued with sliding scale.  Check A1c.    DVT Prophylaxis Heparin    AM Labs Ordered, also please review Full Orders  Family Communication: Admission, patients condition and plan of care including tests being ordered have been discussed with the patient who indicates understanding and agree with the plan and Code  Status.  Code Status Full  Likely DC to  TBD  Condition GUARDED    Consults called: VVS, Renal    Admission status: Inpt    Time spent in minutes : 35   Lala Lund M.D on 01/07/2022 at 5:19 PM  To page go to [email or web address] - password [facility]

## 2022-01-07 NOTE — Op Note (Signed)
Patient name: Edward [name] MRN: [id number] DOB: Jun 19, 1971 Sex: male  01/07/2022 Pre-operative Diagnosis: End-stage renal disease, necrotic bilateral fingers, necrotic right below-knee amputation, left lower extremity wound Post-operative diagnosis:  Same Surgeon:  Eda Paschal. Donzetta Matters, MD Procedure Performed: 1.  Ultrasound-guided cannulation right common femoral artery 2.  Arch aortogram 3.  Selection of bilateral axillary arteries and upper bilateral extremity angiography 4.  Bilateral lower extremity angiography   Indications: 51 year old male with history of right below-knee amputation which is now healing.  He also has amputations of the left fingers and now with new necrosis of fingers on the right.  Wound on the left foot is healing but the right below-knee amputation is necrotic.  He is now indicated for angiography and possible intervention of all extremities.  Findings: The arch of his aorta appears to be a type I arch.  The right upper extremity there is a small lesion in the brachial artery just after the takeoff of a high radial artery.  He does not have any ulnar artery flow to his right hand that is appreciable.  The radial artery has very sluggish flow down to the level of the wrist could not get any flow into the hand.  The interosseous artery flows toe about the level of the wrist and then stops.  No intervention was undertaken there.  On the left side there is no flow-limiting stenosis throughout the upper extremity he does have radial artery dominance which goes to the level of the wrist at least but is difficult given the fistula.  If there is persistent wounds of the left hand we may need to consider ligation of the left arm fistula given that I do not think he would be a good candidate for any extensive revascularization of his left upper extremity.  The aorta in the abdomen is free of flow-limiting stenosis.  The SMA is patent.  Bilateral renal arteries consistent with  dialysis.  Bilateral common iliac arteries are patent and there is disease in the bilateral hypogastric arteries but they are both patent.  On the right side the common femoral artery is patent with profunda flow in the SFA is patent down to the level of above the knee where there is no flow into the below-knee area consistent with necrosis of the below-knee amputation.  On the left side the common femoral and SFA are patent.  There is a 50% stenosis of the popliteal artery and then appears to have dominant flow via the peroneal artery to a transmetatarsal amputation on the left.  No intervention was undertaken.  Plan will be for right above-knee amputation tomorrow in the OR.   Procedure:  The patient was identified in the holding area and taken to room 8.  The patient was then placed supine on the table and prepped and draped in the usual sterile fashion.  A time out was called.  Ultrasound was used to evaluate the right common femoral artery.  The area was thickened but we are able to anesthetize this area to cannulate the artery with micropuncture needle followed by wire and sheath.  Images saved department record.  We placed a 5 French sheath over a Bentson wire followed by pigtail catheter to the aortic arch and patient is given 3000's of heparin.  We performed arch angiography then selected the bilateral subclavian and axillary arteries and performed bilateral upper extremity angiography without intervention.  We then put a pigtail in the abdominal aorta performed bilateral lower extremity angiography  after aortic angiography and there is no intervention there either.  Plan will be for right above-knee amputation tomorrow in the OR.  Catheter was removed over wire.  Sheath will be pulled in postoperative holding.  He tolerated procedure without any complication.  Contrast: 170cc   Dian Minahan C. Donzetta Matters, MD [facility] [place] Office: [phone] Pager: [phone]

## 2022-01-07 NOTE — Interval H&P Note (Signed)
History and Physical Interval Note:  01/07/2022 11:12 AM  Edward [name]  has presented today for surgery, with the diagnosis of necrosis pvd - instage renal.  The various methods of treatment have been discussed with the patient and family. After consideration of risks, benefits and other options for treatment, the patient has consented to  Procedure(s): AORTIC ARCH ANGIOGRAPHY (N/A) Lower Extremity Angiography (N/A) as a surgical intervention.  The patient's history has been reviewed, patient examined, no change in status, stable for surgery.  I have reviewed the patient's chart and labs.  Questions were answered to the patient's satisfaction.     Servando Snare

## 2022-01-08 ENCOUNTER — Inpatient Hospital Stay (HOSPITAL_COMMUNITY): Payer: Medicare Other

## 2022-01-08 ENCOUNTER — Encounter (HOSPITAL_COMMUNITY): Payer: Self-pay | Admitting: Vascular Surgery

## 2022-01-08 ENCOUNTER — Encounter (HOSPITAL_COMMUNITY)
Admission: AD | Disposition: A | Discharge status: Home Health | Payer: Self-pay | Source: Home / Self Care | Attending: Internal Medicine

## 2022-01-08 ENCOUNTER — Inpatient Hospital Stay (HOSPITAL_COMMUNITY): Payer: Medicare Other | Admitting: Anesthesiology

## 2022-01-08 ENCOUNTER — Other Ambulatory Visit: Payer: Self-pay

## 2022-01-08 DIAGNOSIS — E1122 Type 2 diabetes mellitus with diabetic chronic kidney disease: Secondary | ICD-10-CM

## 2022-01-08 DIAGNOSIS — I120 Hypertensive chronic kidney disease with stage 5 chronic kidney disease or end stage renal disease: Secondary | ICD-10-CM

## 2022-01-08 DIAGNOSIS — T8781 Dehiscence of amputation stump: Secondary | ICD-10-CM | POA: Diagnosis not present

## 2022-01-08 DIAGNOSIS — I2510 Atherosclerotic heart disease of native coronary artery without angina pectoris: Secondary | ICD-10-CM

## 2022-01-08 DIAGNOSIS — E1152 Type 2 diabetes mellitus with diabetic peripheral angiopathy with gangrene: Secondary | ICD-10-CM

## 2022-01-08 DIAGNOSIS — I739 Peripheral vascular disease, unspecified: Secondary | ICD-10-CM | POA: Diagnosis not present

## 2022-01-08 HISTORY — PX: AMPUTATION: SHX166

## 2022-01-08 LAB — CBC
HCT: 32 % — ABNORMAL LOW (ref 39.0–52.0)
Hemoglobin: 10.5 g/dL — ABNORMAL LOW (ref 13.0–17.0)
MCH: 32.5 pg (ref 26.0–34.0)
MCHC: 32.8 g/dL (ref 30.0–36.0)
MCV: 99.1 fL (ref 80.0–100.0)
Platelets: 278 10*3/uL (ref 150–400)
RBC: 3.23 MIL/uL — ABNORMAL LOW (ref 4.22–5.81)
RDW: 15.9 % — ABNORMAL HIGH (ref 11.5–15.5)
WBC: 10.8 10*3/uL — ABNORMAL HIGH (ref 4.0–10.5)
nRBC: 0 % (ref 0.0–0.2)

## 2022-01-08 LAB — POCT I-STAT, CHEM 8
BUN: 18 mg/dL (ref 6–20)
Calcium, Ion: 0.97 mmol/L — ABNORMAL LOW (ref 1.15–1.40)
Chloride: 97 mmol/L — ABNORMAL LOW (ref 98–111)
Creatinine, Ser: 4.3 mg/dL — ABNORMAL HIGH (ref 0.61–1.24)
Glucose, Bld: 93 mg/dL (ref 70–99)
HCT: 28 % — ABNORMAL LOW (ref 39.0–52.0)
Hemoglobin: 9.5 g/dL — ABNORMAL LOW (ref 13.0–17.0)
Potassium: 4 mmol/L (ref 3.5–5.1)
Sodium: 133 mmol/L — ABNORMAL LOW (ref 135–145)
TCO2: 25 mmol/L (ref 22–32)

## 2022-01-08 LAB — GLUCOSE, CAPILLARY
Glucose-Capillary: 92 mg/dL (ref 70–99)
Glucose-Capillary: 87 mg/dL (ref 70–99)
Glucose-Capillary: 114 mg/dL — ABNORMAL HIGH (ref 70–99)
Glucose-Capillary: 112 mg/dL — ABNORMAL HIGH (ref 70–99)

## 2022-01-08 LAB — LIPID PANEL
Cholesterol: 165 mg/dL (ref 0–200)
HDL: 31 mg/dL — ABNORMAL LOW (ref >40)
LDL Cholesterol: 109 mg/dL — ABNORMAL HIGH (ref 0–99)
Total CHOL/HDL Ratio: 5.3 RATIO
Triglycerides: 124 mg/dL (ref <150)
VLDL: 25 mg/dL (ref 0–40)

## 2022-01-08 LAB — PHOSPHORUS: Phosphorus: 9.1 mg/dL — ABNORMAL HIGH (ref 2.5–4.6)

## 2022-01-08 LAB — BASIC METABOLIC PANEL
Anion gap: 25 — ABNORMAL HIGH (ref 5–15)
BUN: 55 mg/dL — ABNORMAL HIGH (ref 6–20)
CO2: 21 mmol/L — ABNORMAL LOW (ref 22–32)
Calcium: 9.4 mg/dL (ref 8.9–10.3)
Chloride: 86 mmol/L — ABNORMAL LOW (ref 98–111)
Creatinine, Ser: 9.74 mg/dL — ABNORMAL HIGH (ref 0.61–1.24)
GFR, Estimated: 6 mL/min — ABNORMAL LOW (ref >60)
Glucose, Bld: 102 mg/dL — ABNORMAL HIGH (ref 70–99)
Potassium: 5.5 mmol/L — ABNORMAL HIGH (ref 3.5–5.1)
Sodium: 132 mmol/L — ABNORMAL LOW (ref 135–145)

## 2022-01-08 LAB — ALBUMIN: Albumin: 2.9 g/dL — ABNORMAL LOW (ref 3.5–5.0)

## 2022-01-08 LAB — HEPATITIS B SURFACE ANTIBODY,QUALITATIVE: Hep B S Ab: REACTIVE — AB

## 2022-01-08 LAB — C-REACTIVE PROTEIN: CRP: 19.7 mg/dL — ABNORMAL HIGH (ref <1.0)

## 2022-01-08 LAB — HEPATITIS B SURFACE ANTIGEN: Hepatitis B Surface Ag: NONREACTIVE

## 2022-01-08 LAB — PROCALCITONIN: Procalcitonin: 1.16 ng/mL

## 2022-01-08 LAB — MRSA NEXT GEN BY PCR, NASAL: MRSA by PCR Next Gen: NOT DETECTED

## 2022-01-08 SURGERY — AMPUTATION, ABOVE KNEE
Anesthesia: General | Site: Knee | Laterality: Right

## 2022-01-08 MED ORDER — ONDANSETRON HCL 4 MG/2ML IJ SOLN
INTRAMUSCULAR | Status: DC | PRN
  Administered 2022-01-08: 4 mg via INTRAVENOUS

## 2022-01-08 MED ORDER — OXYCODONE HCL 5 MG PO TABS
ORAL_TABLET | ORAL | Status: AC
  Filled 2022-01-08: qty 1

## 2022-01-08 MED ORDER — GLYCOPYRROLATE PF 0.2 MG/ML IJ SOSY
PREFILLED_SYRINGE | INTRAMUSCULAR | Status: DC | PRN
  Administered 2022-01-08: .2 mg via INTRAVENOUS

## 2022-01-08 MED ORDER — INSULIN ASPART PROT & ASPART (70-30 MIX) 100 UNIT/ML ~~LOC~~ SUSP
30.0000 [IU] | Freq: Two times a day (BID) | SUBCUTANEOUS | Status: DC
  Administered 2022-01-09: 30 [IU] via SUBCUTANEOUS

## 2022-01-08 MED ORDER — FENTANYL CITRATE (PF) 250 MCG/5ML IJ SOLN
INTRAMUSCULAR | Status: DC | PRN
Start: 2022-01-08 — End: 2022-01-08
  Administered 2022-01-08 (×6): 50 ug via INTRAVENOUS
  Administered 2022-01-08: 75 ug via INTRAVENOUS
  Administered 2022-01-08: 50 ug via INTRAVENOUS
  Administered 2022-01-08: 25 ug via INTRAVENOUS

## 2022-01-08 MED ORDER — OXYCODONE HCL 5 MG/5ML PO SOLN: 5.0000 mg | Freq: Once | ORAL | Status: AC | PRN

## 2022-01-08 MED ORDER — FENTANYL CITRATE (PF) 100 MCG/2ML IJ SOLN
INTRAMUSCULAR | Status: AC
  Filled 2022-01-08: qty 2

## 2022-01-08 MED ORDER — FENTANYL CITRATE (PF) 250 MCG/5ML IJ SOLN
INTRAMUSCULAR | Status: AC
  Filled 2022-01-08: qty 5

## 2022-01-08 MED ORDER — FENTANYL CITRATE (PF) 100 MCG/2ML IJ SOLN
25.0000 ug | INTRAMUSCULAR | Status: DC | PRN
  Administered 2022-01-08: 25 ug via INTRAVENOUS
  Administered 2022-01-08: 50 ug via INTRAVENOUS
  Administered 2022-01-08: 25 ug via INTRAVENOUS

## 2022-01-08 MED ORDER — LIDOCAINE 2% (20 MG/ML) 5 ML SYRINGE
INTRAMUSCULAR | Status: AC
  Filled 2022-01-08: qty 5

## 2022-01-08 MED ORDER — EZETIMIBE 10 MG PO TABS
10.0000 mg | ORAL_TABLET | Freq: Every day | ORAL | Status: DC
  Administered 2022-01-09 – 2022-01-16 (×8): 10 mg via ORAL
  Filled 2022-01-08 (×8): qty 1

## 2022-01-08 MED ORDER — SUGAMMADEX SODIUM 200 MG/2ML IV SOLN
INTRAVENOUS | Status: DC | PRN
  Administered 2022-01-08: 200 mg via INTRAVENOUS

## 2022-01-08 MED ORDER — OXYCODONE HCL 5 MG PO TABS
10.0000 mg | ORAL_TABLET | Freq: Four times a day (QID) | ORAL | Status: DC
  Administered 2022-01-09 (×3): 10 mg via ORAL
  Filled 2022-01-08 (×3): qty 2

## 2022-01-08 MED ORDER — ALBUMIN HUMAN 5 % IV SOLN: INTRAVENOUS | Status: DC | PRN

## 2022-01-08 MED ORDER — KETAMINE HCL 50 MG/5ML IJ SOSY
PREFILLED_SYRINGE | INTRAMUSCULAR | Status: AC
  Filled 2022-01-08: qty 5

## 2022-01-08 MED ORDER — CEFAZOLIN SODIUM 1 G IJ SOLR
INTRAMUSCULAR | Status: AC
  Filled 2022-01-08: qty 20

## 2022-01-08 MED ORDER — TRAMADOL HCL 50 MG PO TABS: 50.0000 mg | ORAL_TABLET | Freq: Four times a day (QID) | ORAL | Status: DC | PRN

## 2022-01-08 MED ORDER — FENTANYL CITRATE PF 50 MCG/ML IJ SOSY
50.0000 ug | PREFILLED_SYRINGE | INTRAMUSCULAR | Status: DC | PRN
  Administered 2022-01-08: 100 ug via INTRAVENOUS
  Administered 2022-01-09: 50 ug via INTRAVENOUS
  Administered 2022-01-09: 100 ug via INTRAVENOUS
  Filled 2022-01-08 (×2): qty 2
  Filled 2022-01-08: qty 1

## 2022-01-08 MED ORDER — CHLORHEXIDINE GLUCONATE 0.12 % MT SOLN
15.0000 mL | Freq: Once | OROMUCOSAL | Status: AC
  Administered 2022-01-08: 15 mL via OROMUCOSAL

## 2022-01-08 MED ORDER — OXYCODONE HCL 5 MG PO TABS
5.0000 mg | ORAL_TABLET | Freq: Once | ORAL | Status: AC | PRN
  Administered 2022-01-08: 5 mg via ORAL

## 2022-01-08 MED ORDER — LACTATED RINGERS IV SOLN: INTRAVENOUS | Status: DC

## 2022-01-08 MED ORDER — 0.9 % SODIUM CHLORIDE (POUR BTL) OPTIME
TOPICAL | Status: DC | PRN
  Administered 2022-01-08: 1000 mL

## 2022-01-08 MED ORDER — DEXTROSE 5 % IV SOLN: INTRAVENOUS | Status: AC

## 2022-01-08 MED ORDER — CEFAZOLIN SODIUM-DEXTROSE 2-3 GM-%(50ML) IV SOLR
INTRAVENOUS | Status: DC | PRN
  Administered 2022-01-08: 2 g via INTRAVENOUS

## 2022-01-08 MED ORDER — SODIUM CHLORIDE 0.9 % IV SOLN
125.0000 mg | INTRAVENOUS | Status: DC
  Filled 2022-01-08: qty 10

## 2022-01-08 MED ORDER — GLYCOPYRROLATE PF 0.2 MG/ML IJ SOSY
PREFILLED_SYRINGE | INTRAMUSCULAR | Status: AC
  Filled 2022-01-08: qty 1

## 2022-01-08 MED ORDER — ONDANSETRON HCL 4 MG/2ML IJ SOLN
INTRAMUSCULAR | Status: AC
  Filled 2022-01-08: qty 2

## 2022-01-08 MED ORDER — KETOROLAC TROMETHAMINE 30 MG/ML IJ SOLN
30.0000 mg | Freq: Once | INTRAMUSCULAR | Status: AC
  Administered 2022-01-08: 30 mg via INTRAVENOUS
  Filled 2022-01-08: qty 1

## 2022-01-08 MED ORDER — MIDAZOLAM HCL 2 MG/2ML IJ SOLN
INTRAMUSCULAR | Status: AC
  Filled 2022-01-08: qty 2

## 2022-01-08 MED ORDER — MIDAZOLAM HCL 5 MG/5ML IJ SOLN
INTRAMUSCULAR | Status: DC | PRN
  Administered 2022-01-08: 2 mg via INTRAVENOUS

## 2022-01-08 MED ORDER — PROPOFOL 10 MG/ML IV BOLUS
INTRAVENOUS | Status: DC | PRN
Start: 2022-01-08 — End: 2022-01-08
  Administered 2022-01-08: 20 mg via INTRAVENOUS
  Administered 2022-01-08: 100 mg via INTRAVENOUS

## 2022-01-08 MED ORDER — KETAMINE HCL 10 MG/ML IJ SOLN
INTRAMUSCULAR | Status: DC | PRN
  Administered 2022-01-08: 30 mg via INTRAVENOUS
  Administered 2022-01-08: 20 mg via INTRAVENOUS

## 2022-01-08 MED ORDER — ONDANSETRON HCL 4 MG/2ML IJ SOLN: 4.0000 mg | Freq: Once | INTRAMUSCULAR | Status: DC | PRN

## 2022-01-08 MED ORDER — ROCURONIUM BROMIDE 10 MG/ML (PF) SYRINGE
PREFILLED_SYRINGE | INTRAVENOUS | Status: AC
  Filled 2022-01-08: qty 10

## 2022-01-08 MED ORDER — ORAL CARE MOUTH RINSE: 15.0000 mL | Freq: Once | OROMUCOSAL | Status: AC

## 2022-01-08 MED ORDER — ROCURONIUM BROMIDE 10 MG/ML (PF) SYRINGE
PREFILLED_SYRINGE | INTRAVENOUS | Status: DC | PRN
  Administered 2022-01-08: 20 mg via INTRAVENOUS
  Administered 2022-01-08: 30 mg via INTRAVENOUS

## 2022-01-08 MED ORDER — DOXERCALCIFEROL 4 MCG/2ML IV SOLN
11.0000 ug | INTRAVENOUS | Status: DC
  Administered 2022-01-11 – 2022-01-12 (×2): 11 ug via INTRAVENOUS
  Filled 2022-01-08 (×4): qty 6

## 2022-01-08 MED ORDER — OXYCODONE HCL 5 MG PO TABS
5.0000 mg | ORAL_TABLET | ORAL | Status: DC | PRN
  Administered 2022-01-08: 10 mg via ORAL
  Filled 2022-01-08: qty 2

## 2022-01-08 SURGICAL SUPPLY — 61 items
BAG COUNTER SPONGE SURGICOUNT (BAG) ×3 IMPLANT
BAG SPNG CNTER NS LX DISP (BAG) ×1
BANDAGE ESMARK 6X9 LF (GAUZE/BANDAGES/DRESSINGS) ×1 IMPLANT
BLADE SAGITTAL (BLADE)
BLADE SAGITTAL 25.0X1.19X90 (BLADE) IMPLANT
BLADE SAW GIGLI 510 (BLADE) ×3 IMPLANT
BLADE SAW THK.89X75X18XSGTL (BLADE) IMPLANT
BNDG CMPR 9X6 STRL LF SNTH (GAUZE/BANDAGES/DRESSINGS) ×1
BNDG COHESIVE 6X5 TAN STRL LF (GAUZE/BANDAGES/DRESSINGS) ×2 IMPLANT
BNDG ELASTIC 4X5.8 VLCR STR LF (GAUZE/BANDAGES/DRESSINGS) ×3 IMPLANT
BNDG ELASTIC 6X5.8 VLCR STR LF (GAUZE/BANDAGES/DRESSINGS) ×3 IMPLANT
BNDG ESMARK 6X9 LF (GAUZE/BANDAGES/DRESSINGS) ×2
BNDG GAUZE ELAST 4 BULKY (GAUZE/BANDAGES/DRESSINGS) ×2 IMPLANT
CANISTER SUCT 3000ML PPV (MISCELLANEOUS) ×3 IMPLANT
CLIP LIGATING EXTRA MED SLVR (CLIP) ×2 IMPLANT
CLIP LIGATING EXTRA SM BLUE (MISCELLANEOUS) ×2 IMPLANT
COVER SURGICAL LIGHT HANDLE (MISCELLANEOUS) ×3 IMPLANT
CUFF TOURN SGL QUICK 24 (TOURNIQUET CUFF)
CUFF TOURN SGL QUICK 34 (TOURNIQUET CUFF)
CUFF TRNQT CYL 24X4X16.5-23 (TOURNIQUET CUFF) IMPLANT
CUFF TRNQT CYL 34X4.125X (TOURNIQUET CUFF) IMPLANT
DRAIN CHANNEL 19F RND (DRAIN) IMPLANT
DRAPE DERMATAC (DRAPES) IMPLANT
DRAPE HALF SHEET 40X57 (DRAPES) ×2 IMPLANT
DRAPE INCISE IOBAN 66X45 STRL (DRAPES) IMPLANT
DRAPE ORTHO SPLIT 77X108 STRL (DRAPES) ×4
DRAPE SURG ORHT 6 SPLT 77X108 (DRAPES) ×2 IMPLANT
DRESSING PREVENA PLUS CUSTOM (GAUZE/BANDAGES/DRESSINGS) IMPLANT
DRSG ADAPTIC 3X8 NADH LF (GAUZE/BANDAGES/DRESSINGS) ×2 IMPLANT
DRSG PREVENA PLUS CUSTOM (GAUZE/BANDAGES/DRESSINGS)
ELECT CAUTERY BLADE 6.4 (BLADE) ×2 IMPLANT
ELECT REM PT RETURN 9FT ADLT (ELECTROSURGICAL) ×2
ELECTRODE REM PT RTRN 9FT ADLT (ELECTROSURGICAL) ×1 IMPLANT
EVACUATOR SILICONE 100CC (DRAIN) IMPLANT
GAUZE SPONGE 4X4 12PLY STRL (GAUZE/BANDAGES/DRESSINGS) ×2 IMPLANT
GAUZE SPONGE 4X4 12PLY STRL LF (GAUZE/BANDAGES/DRESSINGS) ×1 IMPLANT
GAUZE XEROFORM 1X8 LF (GAUZE/BANDAGES/DRESSINGS) ×1 IMPLANT
GLOVE BIO SURGEON STRL SZ7.5 (GLOVE) ×2 IMPLANT
GOWN STRL REUS W/ TWL LRG LVL3 (GOWN DISPOSABLE) ×4 IMPLANT
GOWN STRL REUS W/ TWL XL LVL3 (GOWN DISPOSABLE) ×1 IMPLANT
GOWN STRL REUS W/TWL LRG LVL3 (GOWN DISPOSABLE) ×4
GOWN STRL REUS W/TWL XL LVL3 (GOWN DISPOSABLE) ×2
KIT BASIN OR (CUSTOM PROCEDURE TRAY) ×3 IMPLANT
KIT TURNOVER KIT B (KITS) ×3 IMPLANT
NS IRRIG 1000ML POUR BTL (IV SOLUTION) ×2 IMPLANT
PACK GENERAL/GYN (CUSTOM PROCEDURE TRAY) ×2 IMPLANT
PAD ARMBOARD 7.5X6 YLW CONV (MISCELLANEOUS) ×4 IMPLANT
PENCIL BUTTON HOLSTER BLD 10FT (ELECTRODE) ×1 IMPLANT
PREVENA RESTOR ARTHOFORM 46X30 (CANNISTER) IMPLANT
STAPLER VISISTAT 35W (STAPLE) ×3 IMPLANT
STOCKINETTE IMPERVIOUS LG (DRAPES) ×3 IMPLANT
SUT ETHILON 3 0 PS 1 (SUTURE) IMPLANT
SUT SILK 0 TIES 10X30 (SUTURE) ×2 IMPLANT
SUT SILK 2 0 (SUTURE) ×2
SUT SILK 2-0 18XBRD TIE 12 (SUTURE) ×1 IMPLANT
SUT SILK 3 0 (SUTURE)
SUT SILK 3-0 18XBRD TIE 12 (SUTURE) IMPLANT
SUT VIC AB 2-0 CT1 18 (SUTURE) ×8 IMPLANT
TOWEL GREEN STERILE (TOWEL DISPOSABLE) ×4 IMPLANT
UNDERPAD 30X36 HEAVY ABSORB (UNDERPADS AND DIAPERS) ×3 IMPLANT
WATER STERILE IRR 1000ML POUR (IV SOLUTION) ×2 IMPLANT

## 2022-01-08 NOTE — Progress Notes (Signed)
  Progress Note    01/08/2022 1:08 PM Day of Surgery  Subjective:  just finished hd  Vitals:   01/08/22 1230 01/08/22 1259  BP: (!) 156/83 (!) 147/79  Pulse:  93  Resp: 12 18  Temp: 99 F (37.2 C) 99.1 F (37.3 C)  SpO2: 100% 100%    Physical Exam: Aaox3    CBC    Component Value Date/Time   WBC 10.8 (H) 01/08/2022 0643   RBC 3.23 (L) 01/08/2022 0643   HGB 10.5 (L) 01/08/2022 0643   HCT 32.0 (L) 01/08/2022 0643   PLT 278 01/08/2022 0643   MCV 99.1 01/08/2022 0643   MCH 32.5 01/08/2022 0643   MCHC 32.8 01/08/2022 0643   RDW 15.9 (H) 01/08/2022 0643   LYMPHSABS 0.7 11/28/2021 1906   MONOABS 0.6 11/28/2021 1906   EOSABS 0.2 11/28/2021 1906   BASOSABS 0.0 11/28/2021 1906    BMET    Component Value Date/Time   NA 132 (L) 01/08/2022 0643   NA 140 04/30/2019 1507   K 5.5 (H) 01/08/2022 0643   CL 86 (L) 01/08/2022 0643   CO2 21 (L) 01/08/2022 0643   GLUCOSE 102 (H) 01/08/2022 0643   BUN 55 (H) 01/08/2022 0643   BUN 37 (H) 04/30/2019 1507   CREATININE 9.74 (H) 01/08/2022 0643   CREATININE 1.70 (H) 06/15/2018 1458   CALCIUM 9.4 01/08/2022 0643   GFRNONAA 6 (L) 01/08/2022 0643   GFRAA 22 (L) 11/26/2019 1151    INR    Component Value Date/Time   INR 1.2 09/28/2020 0340     Intake/Output Summary (Last 24 hours) at 01/08/2022 1308 Last data filed at 01/08/2022 1230 Gross per 24 hour  Intake --  Output 1000 ml  Net -1000 ml     Assessment/plan:  51 y.o. male is here with necrotic R bka. Plan for R AKA today in OR.     Meredith Kilbride C. Donzetta Matters, MD [facility] Office: [phone] Pager: [phone]  01/08/2022 1:08 PM

## 2022-01-08 NOTE — Anesthesia Postprocedure Evaluation (Signed)
Anesthesia Post Note  Patient: Edward [name]  Procedure(s) Performed: AMPUTATION ABOVE KNEE (Right: Knee)     Patient location during evaluation: PACU Anesthesia Type: General Level of consciousness: awake and alert Pain management: pain level controlled Vital Signs Assessment: post-procedure vital signs reviewed and stable Respiratory status: spontaneous breathing, nonlabored ventilation and respiratory function stable Cardiovascular status: stable and blood pressure returned to baseline Anesthetic complications: no   No notable events documented.  Last Vitals:  Vitals:   01/08/22 1630 01/08/22 1645  BP: (!) 155/66 (!) 147/75  Pulse: 81 81  Resp: 14 19  Temp:  37.2 C  SpO2: 100% 99%                 Audry Pili

## 2022-01-08 NOTE — Progress Notes (Signed)
Son Edward [name] updated pt in preop for surgery

## 2022-01-08 NOTE — Progress Notes (Signed)
Pt back from OR, screaming in pain.  Dr. Donzetta Matters at bedside, ordering toradol.While waiting for order entry/pharmacy prn IV dilaudid administered.

## 2022-01-08 NOTE — Progress Notes (Signed)
PT Cancellation Note  Patient Details Name: Edward [name] MRN: [id number] DOB: [birth year]   Cancelled Treatment:    Reason Eval/Treat Not Completed: Patient at procedure or test/unavailable (HD). Will follow-up for PT Evaluation as schedule permits.  Mabeline Caras, PT, DPT Acute Rehabilitation Services  Pager [phone] Office [place] 01/08/2022, 9:48 AM

## 2022-01-08 NOTE — Progress Notes (Signed)
OT Cancellation Note  Patient Details Name: JOSS MCDILL MRN: [id number] DOB: 12/17/1970   Cancelled Treatment:    Reason Eval/Treat Not Completed: Patient at procedure or test/ unavailable- pt off unit in HD.  Will follow and see as able.   Jolaine Artist, OT Acute Rehabilitation Services Pager [phone] Office [phone]   Delight Stare 01/08/2022, 10:52 AM

## 2022-01-08 NOTE — Procedures (Signed)
   I was present at this dialysis session, have reviewed the session itself and made  appropriate changes Kelly Splinter MD [place] pager [phone]   01/08/2022, 11:02 AM

## 2022-01-08 NOTE — Progress Notes (Signed)
Pt receives out-pt HD at [facility] on TTS. Pt arrives at 5:30 for 5:45 chair time. Will assist as needed.   Melven Sartorius Renal Navigator [phone]

## 2022-01-08 NOTE — Transfer of Care (Signed)
Immediate Anesthesia Transfer of Care Note  Patient: Edward [name]  Procedure(s) Performed: AMPUTATION ABOVE KNEE (Right: Knee)  Patient Location: PACU  Anesthesia Type:General  Level of Consciousness: awake, drowsy, patient cooperative and responds to stimulation  Airway & Oxygen Therapy: Patient Spontanous Breathing and Patient connected to nasal cannula oxygen  Post-op Assessment: Report given to RN and Post -op Vital signs reviewed and stable  Post vital signs: Reviewed and stable  Last Vitals:  Vitals Value Taken Time  BP 143/57 01/08/22 1613  Temp    Pulse 82 01/08/22 1616  Resp 9 01/08/22 1616  SpO2 100 % 01/08/22 1616  Vitals shown include unvalidated device data.  Last Pain:  Vitals:   01/08/22 1259  TempSrc: Oral  PainSc:       Patients Stated Pain Goal: 0 (16/10/96 0454)  Complications: No notable events documented.

## 2022-01-08 NOTE — Progress Notes (Signed)
PROGRESS NOTE                                                                                                                                                                                                             Patient Demographics:    Edward [name], is a 51 y.o. male, DOB - April 29, 1971, MEB:[id number]  Outpatient Primary MD for the patient is [name], MD    LOS - 1  Admit date - 01/07/2022    No chief complaint on file.      Brief Narrative (HPI from H&P)    51 y.o. male, history of severe PAD s/p left transmetatarsal amputation, right BKA with necrotic stump, multiple amputation in left hand fingers, ESRD on TTS schedule for dialysis, DM type 2 insulin-dependent, dyslipidemia, CAD with MI in past, combined systolic and diastolic CHF EF 80%, orthostatic hypotension of chronic disease, AAA, recent Zio patch placement, who was brought in to the Cath Lab for elective bilateral lower extremity angiogram which was done by Dr. Donzetta Matters to evaluate ongoing right BKA stump pain and necrosis, postprocedure hospitalist team was requested to admit the patient as he may require further revision of his right lower extremity stump.   Subjective:    Edward [name] today has, No headache, No chest pain, No abdominal pain - No Nausea, No new weakness tingling or numbness, no SOB, +ve R stump pain.   Assessment  & Plan :   1.  Right BKA stump site discomfort, history of severe PAD, with a right BKA stump necrosis and possible low-grade infection. he has undergone angiography of both lower extremities on 01/07/2022 by vascular surgery, currently on DAPT along with statin and Zetia for secondary prevention, this will be continued, supportive care for pain control, discussed with vascular surgeon Dr. Donzetta Matters patient due for right BKA stump revision and possible AKA on 01/08/2022.  Low-grade fever early morning of 01/08/2022, likely low-grade infection  in the right stump necrosis, blood cultures ordered, currently afebrile, perioperative antibiotics and stump revision to follow later today.  If fever again then placed him on parenteral antibiotics for a few days.    2.  Severe PAD requiring right BKA, left transmetatarsal amputation, multiple amputations of left upper extremity fingers -for PAD DAPT, Zetia and statin for secondary prevention.  Vascular surgery on board.  See above.  3.  ESRD.  On TTS schedule, has left arm AV fistula, renal consulted.   4.  CAD with ischemic cardiomyopathy.  No acute issues supportive care, continue DAPT with statin for secondary prevention, has chronic orthostatic hypotension precluding the use of beta-blockers.   5.  Chronic combined systolic and diastolic CHF with EF 52%.  Currently compensated, fluid removal via HD, blood pressure too low to allow beta-blocker, ACE/ARB use.   6.  Dyslipidemia.  LDL above goal on max dose statin and Zetia added.   7.  DM type II.  Insulin-dependent.  Home 7030 twice daily continued with sliding scale.  Skipping a.m. dose of 7030 on 01/08/2022 as he is n.p.o. for surgery.  Lab Results  Component Value Date   HGBA1C 6.5 (H) 01/07/2022   CBG (last 3)  Recent Labs    01/07/22 2110 01/08/22 0628  GLUCAP 118* 92          Condition - Extremely Guarded  Family Communication  :  none present  Code Status :  Full  Consults  :  Renal, VVS  PUD Prophylaxis : PPI   Procedures  :     Due for right AKA stump revision on 01/08/2022      Disposition Plan  :    Status is: Inpatient  DVT Prophylaxis  :    heparin injection 5,000 Units Start: 01/07/22 2200  Lab Results  Component Value Date   PLT 278 01/08/2022    Diet :  Diet Order             Diet NPO time specified Except for: Sips with Meds  Diet effective now                    Inpatient Medications  Scheduled Meds:  aspirin EC  81 mg Oral Daily   atorvastatin  80 mg Oral Daily    Chlorhexidine Gluconate Cloth  6 each Topical Q0600   clopidogrel  75 mg Oral Daily   gabapentin  100 mg Oral TID   heparin  5,000 Units Subcutaneous Q8H   insulin aspart  0-5 Units Subcutaneous QHS   insulin aspart  0-9 Units Subcutaneous TID WC   insulin aspart protamine- aspart  30 Units Subcutaneous BID WC   linaclotide  72 mcg Oral QAC breakfast   pantoprazole  40 mg Oral Daily   polyethylene glycol  17 g Oral Daily   sodium chloride flush  3 mL Intravenous Q12H   sucroferric oxyhydroxide  1,000 mg Oral TID WC   Continuous Infusions:  sodium chloride     PRN Meds:.sodium chloride, acetaminophen, bisacodyl, bismuth subsalicylate, heparin, hydrALAZINE, HYDROmorphone (DILAUDID) injection, hydrOXYzine, labetalol, ondansetron (ZOFRAN) IV, ondansetron **OR** ondansetron (ZOFRAN) IV, oxyCODONE, sodium chloride flush, traMADol  Antibiotics  :    Anti-infectives (From admission, onward)    None        Time Spent in minutes  30   Lala Lund M.D on 01/08/2022 at 8:18 AM  To page go to [email or web address]   [facility] -  Office  [phone]  See all Orders from today for further details    Objective:   Vitals:   01/08/22 0403 01/08/22 0430 01/08/22 0615 01/08/22 0808  BP: 118/61   (!) 101/58  Pulse: 60     Resp: 18   10  Temp: (!) 101 F (38.3 C) 99.1 F (37.3 C) 99.7 F (37.6 C)   TempSrc: Oral Oral Oral   SpO2: 100%  Weight:    96.6 kg  Height:        Wt Readings from Last 3 Encounters:  01/08/22 96.6 kg  12/26/21 103.9 kg  11/28/21 104 kg    No intake or output data in the 24 hours ending 01/08/22 0818   Physical Exam  Awake Alert, No new F.N deficits, Normal affect [place].AT,PERRAL Supple Neck, No JVD,   Symmetrical Chest wall movement, Good air movement bilaterally, CTAB RRR,No Gallops,Rubs or new Murmurs,  +ve B.Sounds, Abd Soft, No tenderness,   Right BKA stump site under bandage, right femoral cath site stable under bandage, left  transmetatarsal amputation old, multiple fingers in the left hand missing, few necrosed R. Hand fingers, wearing a splint in the right wrist, left arm AV fistula present    Data Review:    CBC Recent Labs  Lab 01/07/22 1137 01/07/22 1822 01/08/22 0643  WBC  --  6.6 10.8*  HGB 10.2* 8.9* 10.5*  HCT 30.0* 26.6* 32.0*  PLT  --  283 278  MCV  --  97.4 99.1  MCH  --  32.6 32.5  MCHC  --  33.5 32.8  RDW  --  15.6* 15.9*    Electrolytes Recent Labs  Lab 01/07/22 1137 01/07/22 1822  NA 133*  --   K 3.9  --   CL 93*  --   GLUCOSE 143*  --   BUN 44*  --   CREATININE 8.50* 8.60*  HGBA1C  --  6.5*    ------------------------------------------------------------------------------------------------------------------ Recent Labs    01/08/22 0643  CHOL 165  HDL 31*  LDLCALC 109*  TRIG 124  CHOLHDL 5.3    Lab Results  Component Value Date   HGBA1C 6.5 (H) 01/07/2022    No results for input(s): TSH, T4TOTAL, T3FREE, THYROIDAB in the last 72 hours.  Invalid input(s): FREET3 ------------------------------------------------------------------------------------------------------------------ ID Labs Recent Labs  Lab 01/07/22 1137 01/07/22 1822 01/08/22 0643  WBC  --  6.6 10.8*  PLT  --  283 278  CREATININE 8.50* 8.60*  --    Cardiac Enzymes No results for input(s): CKMB, TROPONINI, MYOGLOBIN in the last 168 hours.  Invalid input(s): CK       Micro Results No results found for this or any previous visit (from the past 240 hour(s)).  Radiology Reports PERIPHERAL VASCULAR CATHETERIZATION  Result Date: 01/07/2022 Images from the original result were not included. Patient name: Edward [name] MRN: [id number] DOB: Feb 08, 1971 Sex: male 01/07/2022 Pre-operative Diagnosis: End-stage renal disease, necrotic bilateral fingers, necrotic right below-knee amputation, left lower extremity wound Post-operative diagnosis:  Same Surgeon:  Eda Paschal. Donzetta Matters, MD Procedure Performed: 1.   Ultrasound-guided cannulation right common femoral artery 2.  Arch aortogram 3.  Selection of bilateral axillary arteries and upper bilateral extremity angiography 4.  Bilateral lower extremity angiography Indications: 51 year old male with history of right below-knee amputation which is now healing.  He also has amputations of the left fingers and now with new necrosis of fingers on the right.  Wound on the left foot is healing but the right below-knee amputation is necrotic.  He is now indicated for angiography and possible intervention of all extremities. Findings: The arch of his aorta appears to be a type I arch.  The right upper extremity there is a small lesion in the brachial artery just after the takeoff of a high radial artery.  He does not have any ulnar artery flow to his right hand that is appreciable.  The radial artery has very  sluggish flow down to the level of the wrist could not get any flow into the hand.  The interosseous artery flows toe about the level of the wrist and then stops.  No intervention was undertaken there.  On the left side there is no flow-limiting stenosis throughout the upper extremity he does have radial artery dominance which goes to the level of the wrist at least but is difficult given the fistula.  If there is persistent wounds of the left hand we may need to consider ligation of the left arm fistula given that I do not think he would be a good candidate for any extensive revascularization of his left upper extremity.  The aorta in the abdomen is free of flow-limiting stenosis.  The SMA is patent.  Bilateral renal arteries consistent with dialysis.  Bilateral common iliac arteries are patent and there is disease in the bilateral hypogastric arteries but they are both patent.  On the right side the common femoral artery is patent with profunda flow in the SFA is patent down to the level of above the knee where there is no flow into the below-knee area consistent with  necrosis of the below-knee amputation.  On the left side the common femoral and SFA are patent.  There is a 50% stenosis of the popliteal artery and then appears to have dominant flow via the peroneal artery to a transmetatarsal amputation on the left.  No intervention was undertaken. Plan will be for right above-knee amputation tomorrow in the OR.  Procedure:  The patient was identified in the holding area and taken to room 8.  The patient was then placed supine on the table and prepped and draped in the usual sterile fashion.  A time out was called.  Ultrasound was used to evaluate the right common femoral artery.  The area was thickened but we are able to anesthetize this area to cannulate the artery with micropuncture needle followed by wire and sheath.  Images saved department record.  We placed a 5 French sheath over a Bentson wire followed by pigtail catheter to the aortic arch and patient is given 3000's of heparin.  We performed arch angiography then selected the bilateral subclavian and axillary arteries and performed bilateral upper extremity angiography without intervention.  We then put a pigtail in the abdominal aorta performed bilateral lower extremity angiography after aortic angiography and there is no intervention there either.  Plan will be for right above-knee amputation tomorrow in the OR.  Catheter was removed over wire.  Sheath will be pulled in postoperative holding.  He tolerated procedure without any complication. Contrast: 170cc Brandon C. Donzetta Matters, MD [facility] [place] Office: [phone] Pager: [phone]   [facility] Chest Port 1 View  Result Date: 01/08/2022 CLINICAL DATA:  Shortness of breath. EXAM: PORTABLE CHEST 1 VIEW COMPARISON:  09/06/2021 FINDINGS: 0621 hours. The cardio pericardial silhouette is enlarged. The lungs are clear without focal pneumonia, edema, pneumothorax or pleural effusion. The visualized bony structures of the thorax are unremarkable.  Telemetry leads overlie the chest. IMPRESSION: Stable enlargement of the cardiopericardial silhouette. Otherwise no acute findings. Electronically Signed   By: Misty Stanley M.D.   On: 01/08/2022 06:38

## 2022-01-08 NOTE — Op Note (Signed)
    Patient name: Edward [name] MRN: [id number] DOB: 09/12/1970 Sex: male  01/08/2022 Pre-operative Diagnosis: necrotic right below knee amputation Post-operative diagnosis:  Same Surgeon:  Erlene Quan C. Donzetta Matters, MD Assistant: Laurence Slate, PA Procedure Performed: Right above-knee amputation  Indications: 51 year old male with history of right below-knee amputation now has necrotic areas extending above the knee.  He is now indicated for above-knee amputation.  Findings: All tissue appeared viable above the knee and the flaps were reapproximated without tension.   Procedure:  The patient was identified in the holding area and taken to the operating room where is placed upon the operative table general anesthesia was induced.  He was sterilely prepped draped the right lower extremity usual fashion, antibiotics were ministered a timeout was called.  We began with a fishmouth type incision up to the level of healthy tissue.  We dissected down to the bone and elevated the periosteum of the bone was transected with Gigli.  The vascular bundle was clamped and transected.  Posterior flap was created with amputation knife.  The nerve was pulled on tension tied off and divided.  Hemostasis was obtained.  The bone was smoothed with rasp.  The wound was thoroughly irrigated.  Anterior posterior flaps were reapproximated at the fascial level with interrupted 2-0 Vicryl suture.  Staples were placed at the skin level.  Sterile dressing was applied.  He was awakened from anesthesia having tolerated procedure without any complication.  All counts were correct at completion.  EBL: 200 cc     Ayzia Day C. Donzetta Matters, MD [facility] [place] Office: [phone] Pager: [phone]

## 2022-01-08 NOTE — Anesthesia Procedure Notes (Signed)
Procedure Name: Intubation Date/Time: 01/08/2022 3:02 PM Performed by: Cathren Harsh, CRNA Pre-anesthesia Checklist: Patient identified, Emergency Drugs available, Suction available and Patient being monitored Patient Re-evaluated:Patient Re-evaluated prior to induction Oxygen Delivery Method: Circle System Utilized Preoxygenation: Pre-oxygenation with 100% oxygen Induction Type: IV induction Ventilation: Mask ventilation without difficulty Laryngoscope Size: Mac and 3 Grade View: Grade II Tube type: Oral Tube size: 7.5 mm Number of attempts: 1 Airway Equipment and Method: Stylet and Oral airway Placement Confirmation: ETT inserted through vocal cords under direct vision, positive ETCO2 and breath sounds checked- equal and bilateral Secured at: 23 cm Tube secured with: Tape Dental Injury: Teeth and Oropharynx as per pre-operative assessment

## 2022-01-08 NOTE — Anesthesia Preprocedure Evaluation (Addendum)
Anesthesia Evaluation  Patient identified by MRN, date of birth, ID band Patient awake    Reviewed: Allergy & Precautions, NPO status , Patient's Chart, lab work & pertinent test results  History of Anesthesia Complications Negative for: history of anesthetic complications  Airway Mallampati: II  TM Distance: >3 FB Neck ROM: Full    Dental  (+) Loose, Missing   Pulmonary sleep apnea , former smoker,    Pulmonary exam normal        Cardiovascular hypertension, + CAD, + Past MI and + Peripheral Vascular Disease  + Valvular Problems/Murmurs MR  Rhythm:Regular Rate:Normal + Systolic murmurs  '22 TTE - Abnormal septal motion Distal septal / apical hypokinesis . EF 50 to 55%. The left ventricular internal cavity size was mildly dilated. There is mild left ventricular hypertrophy. Grade II diastolic dysfunction (pseudonormalization). There is moderately elevated pulmonary artery systolic pressure. Left atrial size was moderately dilated. The pericardial effusion is posterior to the left ventricle. Carpentier class 3B restricted posterior leaflet motion. Moderate to severe mitral valve regurgitation. Aortic valve regurgitation is mild.     Neuro/Psych PSYCHIATRIC DISORDERS Anxiety Depression TIA   GI/Hepatic Neg liver ROS, GERD  Medicated and Controlled,  Endo/Other  diabetes, Type 2, Insulin Dependent Obesity   Renal/GU ESRF and DialysisRenal disease     Musculoskeletal  (+) Arthritis ,   Abdominal   Peds  Hematology  (+) Blood dyscrasia, anemia ,  On plavix    Anesthesia Other Findings   Reproductive/Obstetrics                            Anesthesia Physical Anesthesia Plan  ASA: 4  Anesthesia Plan: General   Post-op Pain Management: Tylenol PO (pre-op)* and Ketamine IV*   Induction: Intravenous  PONV Risk Score and Plan: 2 and Treatment may vary due to age or medical condition,  Ondansetron, Dexamethasone and Midazolam  Airway Management Planned: LMA  Additional Equipment: None  Intra-op Plan:   Post-operative Plan: Extubation in OR  Informed Consent: I have reviewed the patients History and Physical, chart, labs and discussed the procedure including the risks, benefits and alternatives for the proposed anesthesia with the patient or authorized representative who has indicated his/her understanding and acceptance.     Dental advisory given  Plan Discussed with: CRNA and Anesthesiologist  Anesthesia Plan Comments:        Anesthesia Quick Evaluation

## 2022-01-09 ENCOUNTER — Encounter (HOSPITAL_COMMUNITY): Payer: Self-pay | Admitting: Vascular Surgery

## 2022-01-09 ENCOUNTER — Ambulatory Visit: Payer: Medicare Other | Admitting: Vascular Surgery

## 2022-01-09 ENCOUNTER — Encounter (HOSPITAL_COMMUNITY): Payer: Medicare Other

## 2022-01-09 ENCOUNTER — Inpatient Hospital Stay (HOSPITAL_COMMUNITY): Payer: Medicare Other

## 2022-01-09 DIAGNOSIS — R6520 Severe sepsis without septic shock: Secondary | ICD-10-CM | POA: Diagnosis not present

## 2022-01-09 DIAGNOSIS — G9341 Metabolic encephalopathy: Secondary | ICD-10-CM

## 2022-01-09 DIAGNOSIS — I739 Peripheral vascular disease, unspecified: Secondary | ICD-10-CM | POA: Diagnosis not present

## 2022-01-09 DIAGNOSIS — A419 Sepsis, unspecified organism: Secondary | ICD-10-CM

## 2022-01-09 LAB — CBC
HCT: 22 % — ABNORMAL LOW (ref 39.0–52.0)
Hemoglobin: 6.9 g/dL — CL (ref 13.0–17.0)
MCH: 31.7 pg (ref 26.0–34.0)
MCHC: 31.4 g/dL (ref 30.0–36.0)
MCV: 100.9 fL — ABNORMAL HIGH (ref 80.0–100.0)
Platelets: 210 10*3/uL (ref 150–400)
RBC: 2.18 MIL/uL — ABNORMAL LOW (ref 4.22–5.81)
RDW: 15.9 % — ABNORMAL HIGH (ref 11.5–15.5)
WBC: 11.2 10*3/uL — ABNORMAL HIGH (ref 4.0–10.5)
nRBC: 0 % (ref 0.0–0.2)

## 2022-01-09 LAB — CBC WITH DIFFERENTIAL/PLATELET
Abs Immature Granulocytes: 0.08 10*3/uL — ABNORMAL HIGH (ref 0.00–0.07)
Basophils Absolute: 0 10*3/uL (ref 0.0–0.1)
Basophils Relative: 0 %
Eosinophils Absolute: 0.2 10*3/uL (ref 0.0–0.5)
Eosinophils Relative: 1 %
HCT: 22.4 % — ABNORMAL LOW (ref 39.0–52.0)
Hemoglobin: 7.4 g/dL — ABNORMAL LOW (ref 13.0–17.0)
Immature Granulocytes: 1 %
Lymphocytes Relative: 5 %
Lymphs Abs: 0.6 10*3/uL — ABNORMAL LOW (ref 0.7–4.0)
MCH: 32.9 pg (ref 26.0–34.0)
MCHC: 33 g/dL (ref 30.0–36.0)
MCV: 99.6 fL (ref 80.0–100.0)
Monocytes Absolute: 1 10*3/uL (ref 0.1–1.0)
Monocytes Relative: 8 %
Neutro Abs: 10.3 10*3/uL — ABNORMAL HIGH (ref 1.7–7.7)
Neutrophils Relative %: 85 %
Platelets: 225 10*3/uL (ref 150–400)
RBC: 2.25 MIL/uL — ABNORMAL LOW (ref 4.22–5.81)
RDW: 15.9 % — ABNORMAL HIGH (ref 11.5–15.5)
WBC: 12.2 10*3/uL — ABNORMAL HIGH (ref 4.0–10.5)
nRBC: 0 % (ref 0.0–0.2)

## 2022-01-09 LAB — GLUCOSE, CAPILLARY
Glucose-Capillary: 121 mg/dL — ABNORMAL HIGH (ref 70–99)
Glucose-Capillary: 111 mg/dL — ABNORMAL HIGH (ref 70–99)
Glucose-Capillary: 98 mg/dL (ref 70–99)
Glucose-Capillary: 54 mg/dL — ABNORMAL LOW (ref 70–99)
Glucose-Capillary: 99 mg/dL (ref 70–99)
Glucose-Capillary: 85 mg/dL (ref 70–99)

## 2022-01-09 LAB — PROCALCITONIN: Procalcitonin: 8.75 ng/mL

## 2022-01-09 LAB — COMPREHENSIVE METABOLIC PANEL WITH GFR
ALT: 17 U/L (ref 0–44)
AST: 35 U/L (ref 15–41)
Albumin: 2.5 g/dL — ABNORMAL LOW (ref 3.5–5.0)
Alkaline Phosphatase: 70 U/L (ref 38–126)
Anion gap: 14 (ref 5–15)
BUN: 26 mg/dL — ABNORMAL HIGH (ref 6–20)
CO2: 24 mmol/L (ref 22–32)
Calcium: 8.6 mg/dL — ABNORMAL LOW (ref 8.9–10.3)
Chloride: 96 mmol/L — ABNORMAL LOW (ref 98–111)
Creatinine, Ser: 6 mg/dL — ABNORMAL HIGH (ref 0.61–1.24)
GFR, Estimated: 11 mL/min — ABNORMAL LOW
Glucose, Bld: 122 mg/dL — ABNORMAL HIGH (ref 70–99)
Potassium: 4.2 mmol/L (ref 3.5–5.1)
Sodium: 134 mmol/L — ABNORMAL LOW (ref 135–145)
Total Bilirubin: 1 mg/dL (ref 0.3–1.2)
Total Protein: 6.3 g/dL — ABNORMAL LOW (ref 6.5–8.1)

## 2022-01-09 LAB — MAGNESIUM: Magnesium: 2 mg/dL (ref 1.7–2.4)

## 2022-01-09 LAB — C-REACTIVE PROTEIN: CRP: 21.8 mg/dL — ABNORMAL HIGH (ref <1.0)

## 2022-01-09 LAB — MRSA NEXT GEN BY PCR, NASAL: MRSA by PCR Next Gen: NOT DETECTED

## 2022-01-09 LAB — HEPATITIS B SURFACE ANTIBODY, QUANTITATIVE: Hep B S AB Quant (Post): 37.5 m[IU]/mL (ref >9.9)

## 2022-01-09 LAB — LACTIC ACID, PLASMA: Lactic Acid, Venous: 5.8 mmol/L (ref 0.5–1.9)

## 2022-01-09 MED ORDER — NALOXONE HCL 0.4 MG/ML IJ SOLN
INTRAMUSCULAR | Status: AC
  Filled 2022-01-09: qty 1

## 2022-01-09 MED ORDER — INSULIN ASPART PROT & ASPART (70-30 MIX) 100 UNIT/ML ~~LOC~~ SUSP
20.0000 [IU] | Freq: Two times a day (BID) | SUBCUTANEOUS | Status: DC
  Filled 2022-01-09: qty 10

## 2022-01-09 MED ORDER — ACETAMINOPHEN 325 MG PO TABS: 650.0000 mg | ORAL_TABLET | Freq: Once | ORAL | Status: DC

## 2022-01-09 MED ORDER — VANCOMYCIN HCL 2000 MG/400ML IV SOLN
2000.0000 mg | Freq: Once | INTRAVENOUS | Status: AC
  Administered 2022-01-09: 2000 mg via INTRAVENOUS
  Filled 2022-01-09 (×2): qty 400

## 2022-01-09 MED ORDER — NOREPINEPHRINE 4 MG/250ML-% IV SOLN
2.0000 ug/min | INTRAVENOUS | Status: DC
  Filled 2022-01-09: qty 250

## 2022-01-09 MED ORDER — SODIUM CHLORIDE 0.9 % IV SOLN
250.0000 mL | INTRAVENOUS | Status: DC
  Administered 2022-01-11 – 2022-01-12 (×2): 250 mL via INTRAVENOUS

## 2022-01-09 MED ORDER — HALOPERIDOL LACTATE 5 MG/ML IJ SOLN
5.0000 mg | Freq: Once | INTRAMUSCULAR | Status: AC
  Administered 2022-01-09: 5 mg via INTRAMUSCULAR
  Filled 2022-01-09: qty 1

## 2022-01-09 MED ORDER — LACTATED RINGERS IV BOLUS
500.0000 mL | Freq: Once | INTRAVENOUS | Status: AC
  Administered 2022-01-09: 500 mL via INTRAVENOUS

## 2022-01-09 MED ORDER — DEXTROSE 50 % IV SOLN
INTRAVENOUS | Status: AC
  Administered 2022-01-09: 50 mL
  Filled 2022-01-09: qty 50

## 2022-01-09 MED ORDER — SODIUM CHLORIDE 0.9 % IV SOLN
500.0000 mg | INTRAVENOUS | Status: DC
  Administered 2022-01-10 – 2022-01-15 (×6): 500 mg via INTRAVENOUS
  Filled 2022-01-09 (×8): qty 10

## 2022-01-09 MED ORDER — NALOXONE HCL 0.4 MG/ML IJ SOLN
INTRAMUSCULAR | Status: AC
  Administered 2022-01-09: 0.4 mg
  Filled 2022-01-09: qty 1

## 2022-01-09 MED ORDER — NALOXONE HCL 0.4 MG/ML IJ SOLN
0.4000 mg | INTRAMUSCULAR | Status: DC | PRN
  Administered 2022-01-09: 0.4 mg via INTRAVENOUS

## 2022-01-09 MED ORDER — IBUPROFEN 200 MG PO TABS: 400.0000 mg | ORAL_TABLET | Freq: Once | ORAL | Status: DC

## 2022-01-09 MED ORDER — PIPERACILLIN-TAZOBACTAM IN DEX 2-0.25 GM/50ML IV SOLN
2.2500 g | Freq: Three times a day (TID) | INTRAVENOUS | Status: DC
  Administered 2022-01-09: 2.25 g via INTRAVENOUS
  Filled 2022-01-09 (×3): qty 50

## 2022-01-09 MED ORDER — ASPIRIN 300 MG RE SUPP
300.0000 mg | Freq: Once | RECTAL | Status: AC
  Administered 2022-01-09: 300 mg via RECTAL
  Filled 2022-01-09 (×2): qty 1

## 2022-01-09 MED ORDER — SODIUM CHLORIDE 0.9 % IV SOLN
1.0000 g | INTRAVENOUS | Status: AC
  Administered 2022-01-09: 1 g via INTRAVENOUS
  Filled 2022-01-09: qty 20

## 2022-01-09 MED ORDER — CHLORHEXIDINE GLUCONATE CLOTH 2 % EX PADS
6.0000 | MEDICATED_PAD | Freq: Every day | CUTANEOUS | Status: DC
  Administered 2022-01-09 – 2022-01-11 (×3): 6 via TOPICAL

## 2022-01-09 MED ORDER — LACTATED RINGERS IV BOLUS
500.0000 mL | Freq: Once | INTRAVENOUS | Status: AC
Start: 2022-01-09 — End: 2022-01-09
  Administered 2022-01-09: 250 mL via INTRAVENOUS

## 2022-01-09 MED ORDER — DARBEPOETIN ALFA 100 MCG/0.5ML IJ SOSY
100.0000 ug | PREFILLED_SYRINGE | INTRAMUSCULAR | Status: DC
  Administered 2022-01-11: 100 ug via INTRAVENOUS
  Filled 2022-01-09 (×2): qty 0.5

## 2022-01-09 MED ORDER — ALBUMIN HUMAN 5 % IV SOLN
25.0000 g | Freq: Once | INTRAVENOUS | Status: AC
  Administered 2022-01-09: 25 g via INTRAVENOUS
  Filled 2022-01-09: qty 500

## 2022-01-09 MED ORDER — VANCOMYCIN HCL IN DEXTROSE 1-5 GM/200ML-% IV SOLN
1000.0000 mg | INTRAVENOUS | Status: DC
  Administered 2022-01-11: 1000 mg via INTRAVENOUS
  Filled 2022-01-09 (×2): qty 200

## 2022-01-09 NOTE — Progress Notes (Signed)
Received patient from 4E.  Patient agitated and swinging arms. Unable to follow commands at this time.  Extremely impulsive and flailing arms everywhere.  Order received to give Haldol either IM or IV if IV is able to be put in.  IV team in and IV placed.  '5mg'$  Haldol given IV at this time.   Addendum:  Patient now calmer and less agitated and no longer trying to hit.  Patient remains confused but now sleeping.

## 2022-01-09 NOTE — Progress Notes (Signed)
[facility] Progress Note   Subjective:   Patient seen and examined at bedside in room.  Reports pain mostly well controlled.  Denies CP, SOB, abdominal pain, fever, chills and n/v/d.  Reports dialysis went well yesterday.    Objective Vitals:   01/08/22 2059 01/09/22 0345 01/09/22 0632 01/09/22 0745  BP: (!) 149/76 110/74 124/74 99/60  Pulse: 79  82 64  Resp: '15 20 12 14  '$ Temp: 98.2 F (36.8 C) 99.5 F (37.5 C)  99.7 F (37.6 C)  TempSrc: Oral Oral  Oral  SpO2: 100% 100% 94% 100%  Weight:      Height:       Physical Exam General:chronically ill appearing male in NAD Heart:RRR, no mrg Lungs:CTAB, nml WOB on RA Abdomen:soft, NTND Extremities:R AKA, L TMA dressing on foot/ankle - trace edema Dialysis Access: [facility]   Filed Weights   01/08/22 0808 01/08/22 1230 01/08/22 1259  Weight: 96.6 kg 104.3 kg 104.3 kg    Intake/Output Summary (Last 24 hours) at 01/09/2022 0942 Last data filed at 01/08/2022 1609 Gross per 24 hour  Intake 910 ml  Output 1500 ml  Net -590 ml    Additional Objective Labs: Basic Metabolic Panel: Recent Labs  Lab 01/08/22 0543 01/08/22 0643 01/08/22 1314 01/09/22 0309  NA  --  132* 133* 134*  K  --  5.5* 4.0 4.2  CL  --  86* 97* 96*  CO2  --  21*  --  24  GLUCOSE  --  102* 93 122*  BUN  --  55* 18 26*  CREATININE  --  9.74* 4.30* 6.00*  CALCIUM  --  9.4  --  8.6*  PHOS 9.1*  --   --   --    Liver Function Tests: Recent Labs  Lab 01/08/22 0543 01/09/22 0309  AST  --  35  ALT  --  17  ALKPHOS  --  70  BILITOT  --  1.0  PROT  --  6.3*  ALBUMIN 2.9* 2.5*    CBC: Recent Labs  Lab 01/07/22 1822 01/08/22 0643 01/08/22 1314 01/09/22 0309  WBC 6.6 10.8*  --  12.2*  NEUTROABS  --   --   --  10.3*  HGB 8.9* 10.5* 9.5* 7.4*  HCT 26.6* 32.0* 28.0* 22.4*  MCV 97.4 99.1  --  99.6  PLT 283 278  --  225    CBG: Recent Labs  Lab 01/08/22 0628 01/08/22 1258 01/08/22 1617 01/08/22 2102 01/09/22 0645  GLUCAP 92 87  114* 112* 121*    Studies/Results: PERIPHERAL VASCULAR CATHETERIZATION  Result Date: 01/07/2022 Images from the original result were not included. Patient name: Edward [name] MRN: [id number] DOB: Feb 23, 1971 Sex: male 01/07/2022 Pre-operative Diagnosis: End-stage renal disease, necrotic bilateral fingers, necrotic right below-knee amputation, left lower extremity wound Post-operative diagnosis:  Same Surgeon:  Eda Paschal. Donzetta Matters, MD Procedure Performed: 1.  Ultrasound-guided cannulation right common femoral artery 2.  Arch aortogram 3.  Selection of bilateral axillary arteries and upper bilateral extremity angiography 4.  Bilateral lower extremity angiography Indications: 51 year old male with history of right below-knee amputation which is now healing.  He also has amputations of the left fingers and now with new necrosis of fingers on the right.  Wound on the left foot is healing but the right below-knee amputation is necrotic.  He is now indicated for angiography and possible intervention of all extremities. Findings: The arch of his aorta appears to be a type I arch.  The right upper extremity there is a small lesion in the brachial artery just after the takeoff of a high radial artery.  He does not have any ulnar artery flow to his right hand that is appreciable.  The radial artery has very sluggish flow down to the level of the wrist could not get any flow into the hand.  The interosseous artery flows toe about the level of the wrist and then stops.  No intervention was undertaken there.  On the left side there is no flow-limiting stenosis throughout the upper extremity he does have radial artery dominance which goes to the level of the wrist at least but is difficult given the fistula.  If there is persistent wounds of the left hand we may need to consider ligation of the left arm fistula given that I do not think he would be a good candidate for any extensive revascularization of his left upper extremity.   The aorta in the abdomen is free of flow-limiting stenosis.  The SMA is patent.  Bilateral renal arteries consistent with dialysis.  Bilateral common iliac arteries are patent and there is disease in the bilateral hypogastric arteries but they are both patent.  On the right side the common femoral artery is patent with profunda flow in the SFA is patent down to the level of above the knee where there is no flow into the below-knee area consistent with necrosis of the below-knee amputation.  On the left side the common femoral and SFA are patent.  There is a 50% stenosis of the popliteal artery and then appears to have dominant flow via the peroneal artery to a transmetatarsal amputation on the left.  No intervention was undertaken. Plan will be for right above-knee amputation tomorrow in the OR.  Procedure:  The patient was identified in the holding area and taken to room 8.  The patient was then placed supine on the table and prepped and draped in the usual sterile fashion.  A time out was called.  Ultrasound was used to evaluate the right common femoral artery.  The area was thickened but we are able to anesthetize this area to cannulate the artery with micropuncture needle followed by wire and sheath.  Images saved department record.  We placed a 5 French sheath over a Bentson wire followed by pigtail catheter to the aortic arch and patient is given 3000's of heparin.  We performed arch angiography then selected the bilateral subclavian and axillary arteries and performed bilateral upper extremity angiography without intervention.  We then put a pigtail in the abdominal aorta performed bilateral lower extremity angiography after aortic angiography and there is no intervention there either.  Plan will be for right above-knee amputation tomorrow in the OR.  Catheter was removed over wire.  Sheath will be pulled in postoperative holding.  He tolerated procedure without any complication. Contrast: 170cc Brandon C.  Donzetta Matters, MD [facility] Office: [phone] Pager: [phone]   [facility] Chest Port 1 View  Result Date: 01/08/2022 CLINICAL DATA:  Shortness of breath. EXAM: PORTABLE CHEST 1 VIEW COMPARISON:  09/06/2021 FINDINGS: 0621 hours. The cardio pericardial silhouette is enlarged. The lungs are clear without focal pneumonia, edema, pneumothorax or pleural effusion. The visualized bony structures of the thorax are unremarkable. Telemetry leads overlie the chest. IMPRESSION: Stable enlargement of the cardiopericardial silhouette. Otherwise no acute findings. Electronically Signed   By: Misty Stanley M.D.   On: 01/08/2022 06:38    Medications:  sodium chloride  Stopped (01/08/22 1609)   ferric gluconate (FERRLECIT) IVPB      aspirin EC  81 mg Oral Daily   atorvastatin  80 mg Oral Daily   Chlorhexidine Gluconate Cloth  6 each Topical Q0600   clopidogrel  75 mg Oral Daily   doxercalciferol  11 mcg Intravenous Q T,Th,Sa-HD   ezetimibe  10 mg Oral Daily   gabapentin  100 mg Oral TID   heparin  5,000 Units Subcutaneous Q8H   insulin aspart  0-5 Units Subcutaneous QHS   insulin aspart  0-9 Units Subcutaneous TID WC   insulin aspart protamine- aspart  30 Units Subcutaneous BID WC   linaclotide  72 mcg Oral QAC breakfast   oxyCODONE  10 mg Oral Q6H   pantoprazole  40 mg Oral Daily   polyethylene glycol  17 g Oral Daily   sodium chloride flush  3 mL Intravenous Q12H   sucroferric oxyhydroxide  1,000 mg Oral TID WC    Dialysis Orders: T[facility]  4.5h  425/700  94kg  2/2 bath  LUA AVF   Heparin 8000+ 2021mdrun prn - venofer 100 mg tiw IV , 5/04- 5/25 - mircera 30 ug q4 wks, last 5/4, due early June - hectorol 11 ug tiw IV - last HD 5/20, 93.3 > 92.0 kg - Hb 9.9 on 5/18     Assessment/ Plan: R BKA stump pain/ severe PAD/Possible cellulits + necrotic fingers- s/p AKA on 01/08/22 by Dr. CDonzetta Matters BC NGTD. To start 3d trial ABX tomorrow.  ESRD - on HD TTS. HD tomorrow per  regular schedule.  Hyperkalemia - resolved.  Severe PAD - sp L TMA, multiple amputations of L hand CAD/ ICM / combined CHF - EF 40-45% DM2 - per pmd BP/ vol - BP's soft to normal. Way over dry weight per bed weights. UF as tolerated.  Will need new dry weight not post AKA. Anemia esrd - Hb drop to 7.4 post surgery. Hold iron d/t potential infection.  Order mircera with next HD.  Transfuse prn for Hgb<7. MBD ckd - Ca in range, phos elevated. Cont velphoro as binder and IV vdra.  Nutrition - renal diet w/fluid restrictions. Alb 2.5 - give protein supplements  LJen Mow PA-C [place]5/24/2023,9:42 AM  LOS: 2 days

## 2022-01-09 NOTE — Progress Notes (Signed)
Pharmacy Antibiotic Note  Edward [name] is a 51 y.o. male admitted on 01/07/2022 with R BKA stump site discomfort. S/p R AKA 5/23 but pt with elevation in CRP and low-grade fevers. ?low grade cellulitis around amputation site. Pharmacy has been consulted for Vancomycin and meropenem  Pt continues on HD - T/T/S  Plan: Mropenem 1gm then 1gm IV q24hr Continue vancomycin '1000mg'$  IV qHD  Will f/u renal function, micro data, and pt's clinical condition   Height: '6\' 1"'$  (185.4 cm) Weight: 104.3 kg (230 lb) IBW/kg (Calculated) : 79.9  Temp (24hrs), Avg:99.9 F (37.7 C), Min:97.8 F (36.6 C), Max:102.2 F (39 C)  Recent Labs  Lab 01/07/22 1137 01/07/22 1822 01/08/22 0643 01/08/22 1314 01/09/22 0309  WBC  --  6.6 10.8*  --  12.2*  CREATININE 8.50* 8.60* 9.74* 4.30* 6.00*     Estimated Creatinine Clearance: 18.3 mL/min (A) (by C-G formula based on SCr of 6 mg/dL (H)).    Allergies  Allergen Reactions   Crestor [Rosuvastatin] Rash    Also had a red eye   Dilaudid [Hydromorphone] Itching   Benadryl [Diphenhydramine] Rash    Antimicrobials this admission: 5/24 Vanc >> 5/26 5/24 Zosyn >> 5/24 5/24 meropenem  Microbiology results: 5/23 BCx:  5/23 MRSA PCR: negative  Thank you for allowing pharmacy to be a part of this patient's care.  Hildred Laser, PharmD Clinical Pharmacist **Pharmacist phone directory can now be found on [email or web address] ([email or web address]).  Listed under [place].

## 2022-01-09 NOTE — Evaluation (Signed)
Occupational Therapy Evaluation Patient Details Name: Edward [name] MRN: [id number] DOB: [birth year] Today's Date: 01/09/2022   History of Present Illness 51 y.o. male, history of severe PAD s/p left transmetatarsal amputation, right BKA with necrotic stump, multiple amputation in left hand fingers, ESRD on TTS schedule for dialysis, DM type 2 insulin-dependent, dyslipidemia, CAD with MI in past, combined systolic and diastolic CHF EF 77%, orthostatic hypotension of chronic disease, AAA, recent Zio patch placement, who was being evaluated for ongoing right BKA stump pain and necrosis.  S/p transition from R BKA to R AKA.   Clinical Impression   Patient admitted for the diagnosis and procedure above.  PTA he lives with his son, who is able to assist as needed.  Patient did use a R prothesis and SPC, but had been relying more on the wheelchair as of late.  Patient stating he was still performing his own ADL and participating with IADLs, but this will need to be confirmed due to lethargy.  Patient returned to supine for chest x-ray, and out of bed will need to be completed as LOA improves.  Currently he is needing Min A and safety/steadying for bed mobility, and estimated Mod A for lower body ADL.  OT will follow in the acute setting with [facility] currently being recommended.  Patient has been seen by this OT in the past, and has progressed quickly.  Once OOB has been assessed, a more firm discharge recommendation can be given.       Recommendations for follow up therapy are one component of a multi-disciplinary discharge planning process, led by the attending physician.  Recommendations may be updated based on patient status, additional functional criteria and insurance authorization.   Follow Up Recommendations  Home health OT    Assistance Recommended at Discharge Intermittent Supervision/Assistance  Patient can return home with the following A little help with walking and/or transfers;A little help with  bathing/dressing/bathroom;Help with stairs or ramp for entrance;Assist for transportation    Functional Status Assessment  Patient has had a recent decline in their functional status and demonstrates the ability to make significant improvements in function in a reasonable and predictable amount of time.  Equipment Recommendations  None recommended by OT    Recommendations for Other Services       Precautions / Restrictions Precautions Precautions: Fall Restrictions Weight Bearing Restrictions: Yes RLE Weight Bearing: Non weight bearing Other Position/Activity Restrictions: multiple amputations of LE and UE.      Mobility Bed Mobility Overal bed mobility: Needs Assistance Bed Mobility: Rolling, Supine to Sit, Sit to Supine Rolling: Min assist   Supine to sit: Min assist, HOB elevated Sit to supine: Min assist, HOB elevated   General bed mobility comments: unsteady with big movements    Transfers                   General transfer comment: NT due to LOA      Balance Overall balance assessment: Needs assistance Sitting-balance support: Bilateral upper extremity supported Sitting balance-Leahy Scale: Fair   Postural control: Posterior lean, Left lateral lean                                 ADL either performed or assessed with clinical judgement   ADL       Grooming: Wash/dry hands;Wash/dry face;Set up;Bed level           Upper Body Dressing : Minimal  assistance;Sitting   Lower Body Dressing: Moderate assistance;Sitting/lateral leans                       Vision   Vision Assessment?: No apparent visual deficits     Perception Perception Perception: Not tested   Praxis Praxis Praxis: Not tested    Pertinent Vitals/Pain Pain Assessment Pain Assessment: Faces Faces Pain Scale: Hurts little more Pain Location: R residual leg and low back Pain Descriptors / Indicators: Guarding, Grimacing Pain Intervention(s):  Monitored during session     Hand Dominance Right   Extremity/Trunk Assessment Upper Extremity Assessment Upper Extremity Assessment: Overall WFL for tasks assessed   Lower Extremity Assessment Lower Extremity Assessment: Defer to PT evaluation   Cervical / Trunk Assessment Cervical / Trunk Assessment: Normal   Communication Communication Communication: No difficulties   Cognition Arousal/Alertness: Lethargic Behavior During Therapy: Flat affect Overall Cognitive Status: Difficult to assess                                 General Comments: following basic commands, answering at times to yes/no questions.     General Comments   VSS on RA    Exercises     Shoulder Instructions      Home Living Family/patient expects to be discharged to:: Private residence Living Arrangements: Children Available Help at Discharge: Family;Available 24 hours/day Type of Home: House Home Access: Stairs to enter [employer] of Steps: 2-3 Entrance Stairs-Rails: Right;Left Home Layout: One level     Bathroom Shower/Tub: Occupational psychologist: Standard Bathroom Accessibility: Yes How Accessible: Accessible via walker Home Equipment: [place] - single point;Shower seat;BSC/3in1;Wheelchair - manual   Additional Comments: Has a R BKA prosthetic that he was using.      Prior Functioning/Environment Prior Level of Function : Needs assist             Mobility Comments: Walks with a SPC at baseline, but using wheelchair more as of late, unable to don R leg ADLs Comments: Assist as needed with L shoe from son.  Not currenlty driving.  Son assisting with community mobility.        OT Problem List: Decreased activity tolerance;Impaired balance (sitting and/or standing);Decreased safety awareness;Pain      OT Treatment/Interventions: Self-care/ADL training;Therapeutic activities;DME and/or AE instruction;Patient/family education;Balance training     OT Goals(Current goals can be found in the care plan section) Acute Rehab OT Goals OT Goal Formulation: Patient unable to participate in goal setting Time For Goal Achievement: 01/23/22 Potential to Achieve Goals: Good ADL Goals Pt Will Perform Grooming: with set-up;standing;sitting Pt Will Perform Upper Body Dressing: with set-up;sitting Pt Will Perform Lower Body Dressing: with set-up;sitting/lateral leans Pt Will Transfer to Toilet: with supervision;squat pivot transfer;regular height toilet;bedside commode  OT Frequency: Min 2X/week    Co-evaluation              AM-PAC OT "6 Clicks" Daily Activity     Outcome Measure Help from another person eating meals?: None Help from another person taking care of personal grooming?: None Help from another person toileting, which includes using toliet, bedpan, or urinal?: A Lot Help from another person bathing (including washing, rinsing, drying)?: A Lot Help from another person to put on and taking off regular upper body clothing?: A Little Help from another person to put on and taking off regular lower body clothing?: A Lot 6 Click Score:  17   End of Session Nurse Communication: Mobility status  Activity Tolerance: Patient limited by lethargy Patient left: in bed;with call bell/phone within reach  OT Visit Diagnosis: Unsteadiness on feet (R26.81);Pain Pain - Right/Left: Right Pain - part of body: Leg                Time: 1017-5102 OT Time Calculation (min): 17 min Charges:  OT General Charges $OT Visit: 1 Visit OT Evaluation $OT Eval Moderate Complexity: 1 Mod  01/09/2022  RP, OTR/L  Acute Rehabilitation Services  Office:  [phone]   Metta Clines 01/09/2022, 2:57 PM

## 2022-01-09 NOTE — Progress Notes (Signed)
Date and time results received: 01/09/22 1845 (use smartphrase ".now" to insert current time)  Test: Lactic acid Critical Value: 5.8  Name of Provider Notified: Dr. Tamala Julian  Orders Received? Or Actions Taken?:    No new orders at this time.

## 2022-01-09 NOTE — Progress Notes (Addendum)
An USGPIV (ultrasound guided PIV) has been placed for short-term vasopressor infusion. A correctly placed ivWatch must be used when administering Vasopressors. Should this treatment be needed beyond 72 hours, central line access should be obtained.  It will be the responsibility of the bedside nurse to follow best practice to prevent extravasations.   ?

## 2022-01-09 NOTE — Evaluation (Signed)
Physical Therapy Evaluation Patient Details Name: Edward [name] MRN: [id number] DOB: 26-Apr-1971 Today's Date: 01/09/2022  History of Present Illness  51 y.o. 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  S/p transition from R BKA to R AKA 01/08/22.  Clinical Impression  PT evaluation limited due to decreased level of arousal. Attempted cold wash cloth, sternal rub, noxious stimulus, mobility and verbal stimuli without success; pt able to open eyes for seconds but then goes back to sleep. Not able to sustain arousal or attention. Total A of 2 to get to EOB and return to supine. Sp02 dropped to 87% on RA during session, returned to low 90s. RN made aware of session and lack of progress/participation due to arousal. Will follow acutely to maximize independence, mobility and further assess OOB mobility to determine appropriate disposition.       Recommendations for follow up therapy are one component of a multi-disciplinary discharge planning process, led by the attending physician.  Recommendations may be updated based on patient status, additional functional criteria and insurance authorization.  Follow Up Recommendations Acute inpatient rehab (3hours/day) (pending further assessment)    Assistance Recommended at Discharge Frequent or constant Supervision/Assistance  Patient can return home with the following  Two people to help with walking and/or transfers;Assistance with cooking/housework;Assist for transportation;Help with stairs or ramp for entrance    Equipment Recommendations None recommended by PT  Recommendations for Other Services        Functional Status Assessment Patient has had a recent decline in their functional status and demonstrates the ability to make significant improvements in function in a reasonable and predictable amount of time.     Precautions / Restrictions Precautions Precautions: Fall Restrictions Weight Bearing Restrictions: Yes RLE Weight Bearing: Non weight bearing Other Position/Activity Restrictions: multiple amputations of LE and UE.      Mobility  Bed Mobility Overal bed mobility: Needs Assistance Bed Mobility: Supine to Sit     Supine to sit: Total assist, +2 for physical assistance, HOB elevated Sit to supine: Total assist, +2 for physical assistance, HOB elevated   General bed mobility comments: Total A to get to EOB and return to supine due to decreased level of arousal. Did not wake up sitting EOB despite attempts to arouse.    Transfers                   General transfer comment: Deferred due to arousal.    Ambulation/Gait                  Stairs            Wheelchair Mobility    Modified Rankin (Stroke Patients Only)       Balance Overall balance assessment: Needs assistance Sitting-balance support: Feet supported, Bilateral upper extremity supported Sitting balance-Leahy Scale: Zero Sitting balance - Comments: Total A with posterior pelvis tilt sitting EOB Postural control: Posterior lean                                   Pertinent Vitals/Pain Pain Assessment Pain Assessment: Faces Faces Pain  Scale: No hurt    Home Living Family/patient expects to be discharged to:: Private residence Living Arrangements: Children Available Help at Discharge: Family;Available 24 hours/day Type of Home: House Home Access: Stairs to enter Entrance Stairs-Rails: Psychiatric nurse of Steps: 2-3   Home Layout: One level Home Equipment: Cane - single point;Shower seat;BSC/3in1;Wheelchair - manual Additional Comments:  Has a R BKA prosthetic that he was using.    Prior Function Prior Level of Function : Needs assist             Mobility Comments: Walks with a SPC at baseline, but using wheelchair more as of late, unable to don R leg ADLs Comments: Assist as needed with L shoe from son.  Not currenlty driving.  Son assisting with community mobility.     Hand Dominance   Dominant Hand: Right    Extremity/Trunk Assessment   Upper Extremity Assessment Upper Extremity Assessment: Defer to OT evaluation    Lower Extremity Assessment Lower Extremity Assessment: Difficult to assess due to impaired cognition;LLE deficits/detail;RLE deficits/detail RLE Deficits / Details: s/p Rt AKA, some drainaged noted LLE Deficits / Details: TM amputation, unna boot    Cervical / Trunk Assessment Cervical / Trunk Assessment: Normal  Communication   Communication: No difficulties  Cognition Arousal/Alertness: Lethargic, Suspect due to medications Behavior During Therapy: Flat affect Overall Cognitive Status: Difficult to assess                                 General Comments: Pt completely obtunded, would only open eyes for seconds at a time and would fall back asleep immediately. Attempted to arouse with wet wash cloth, sternal rub. noxious stimulus. mobility etc.        General Comments General comments (skin integrity, edema, etc.): Sp02 dropping to 87% on RA    Exercises     Assessment/Plan    PT Assessment Patient needs continued PT services  PT Problem List Decreased strength;Decreased mobility;Decreased range of motion;Decreased cognition;Decreased skin integrity;Cardiopulmonary status limiting activity;Decreased activity tolerance;Decreased balance;Decreased knowledge of use of DME       PT Treatment Interventions Therapeutic activities;Gait training;Therapeutic exercise;Patient/family education;Balance training;Wheelchair mobility training;Functional mobility training;DME  instruction;Cognitive remediation    PT Goals (Current goals can be found in the Care Plan section)  Acute Rehab PT Goals Patient Stated Goal: none stated PT Goal Formulation: Patient unable to participate in goal setting Time For Goal Achievement: 01/23/22 Potential to Achieve Goals: Fair    Frequency Min 3X/week     Co-evaluation               AM-PAC PT "6 Clicks" Mobility  Outcome Measure Help needed turning from your back to your side while in a flat bed without using bedrails?: Total Help needed moving from lying on your back to sitting on the side of a flat bed without using bedrails?: Total Help needed moving to and from a bed to a chair (including a wheelchair)?: Total Help needed standing up from a chair using your arms (e.g., wheelchair or bedside chair)?: Total Help needed to walk in hospital room?: Total Help needed climbing 3-5 steps with a railing? : Total 6 Click Score: 6    End of Session   Activity Tolerance: Patient limited by lethargy Patient left: in bed;with call bell/phone within reach;with bed alarm set Nurse Communication: Mobility status PT Visit Diagnosis: Muscle weakness (generalized) (M62.81);Unsteadiness on feet (R26.81);Difficulty in walking, not elsewhere  classified (R26.2)    Time: 8185-9093 PT Time Calculation (min) (ACUTE ONLY): 14 min   Charges:   PT Evaluation $PT Eval Moderate Complexity: 1 Mod          Marisa Severin, PT, DPT Acute Rehabilitation Services Secure chat preferred Office [place] 01/09/2022, 4:06 PM

## 2022-01-09 NOTE — Progress Notes (Signed)
  Progress Note    01/09/2022 8:16 AM 1 Day Post-Op  Subjective:  no major complaints. Says pain is well controlled   Vitals:   01/09/22 0632 01/09/22 0745  BP: 124/74 99/60  Pulse: 82 64  Resp: 12 14  Temp:  99.7 F (37.6 C)  SpO2: 94% 100%   Physical Exam: Cardiac:  regular Lungs:  non labored Incisions:  right AKA with viable flaps, no fluid collections. Staples intact without any bleeding or drainage Extremities:  well perfused Abdomen:  soft Neurologic: alert and oriented  CBC    Component Value Date/Time   WBC 12.2 (H) 01/09/2022 0309   RBC 2.25 (L) 01/09/2022 0309   HGB 7.4 (L) 01/09/2022 0309   HCT 22.4 (L) 01/09/2022 0309   PLT 225 01/09/2022 0309   MCV 99.6 01/09/2022 0309   MCH 32.9 01/09/2022 0309   MCHC 33.0 01/09/2022 0309   RDW 15.9 (H) 01/09/2022 0309   LYMPHSABS 0.6 (L) 01/09/2022 0309   MONOABS 1.0 01/09/2022 0309   EOSABS 0.2 01/09/2022 0309   BASOSABS 0.0 01/09/2022 0309    BMET    Component Value Date/Time   NA 134 (L) 01/09/2022 0309   NA 140 04/30/2019 1507   K 4.2 01/09/2022 0309   CL 96 (L) 01/09/2022 0309   CO2 24 01/09/2022 0309   GLUCOSE 122 (H) 01/09/2022 0309   BUN 26 (H) 01/09/2022 0309   BUN 37 (H) 04/30/2019 1507   CREATININE 6.00 (H) 01/09/2022 0309   CREATININE 1.70 (H) 06/15/2018 1458   CALCIUM 8.6 (L) 01/09/2022 0309   GFRNONAA 11 (L) 01/09/2022 0309   GFRAA 22 (L) 11/26/2019 1151    INR    Component Value Date/Time   INR 1.2 09/28/2020 0340     Intake/Output Summary (Last 24 hours) at 01/09/2022 0816 Last data filed at 01/08/2022 1609 Gross per 24 hour  Intake 910 ml  Output 1500 ml  Net -590 ml     Assessment/Plan:  51 y.o. male is s/p right aka 1 Day Post-Op   Pain overall well controlled ACE wrap was removed last night due to discomfort Right AKA well appearing, no drainage or bleeding. Flaps appear viable PT/ OT evaluation today Hgb 7.4. Transfusion per primary team/ nephrology  Karoline Caldwell, PA-C [facility] [phone] 01/09/2022 8:16 AM

## 2022-01-09 NOTE — Progress Notes (Signed)
Pharmacy Antibiotic Note  Edward [name] is a 51 y.o. male admitted on 01/07/2022 with R BKA stump site discomfort. S/p R AKA 5/23 but pt with elevation in CRP and low-grade fevers. ?low grade cellulitis around amputation site. Pharmacy has been consulted for Vancomycin and Zosyn dosing x 3 days.  Pt continues on HD - T/T/S  Plan: Zosyn 2.25gm IV q8h x 3 days Vancomycin 2000 mg IV now then '1000mg'$  IV qHD x 3 days Will f/u renal function, micro data, and pt's clinical condition   Height: '6\' 1"'$  (185.4 cm) Weight: 104.3 kg (230 lb) IBW/kg (Calculated) : 79.9  Temp (24hrs), Avg:98.8 F (37.1 C), Min:97.8 F (36.6 C), Max:99.7 F (37.6 C)  Recent Labs  Lab 01/07/22 1137 01/07/22 1822 01/08/22 0643 01/08/22 1314 01/09/22 0309  WBC  --  6.6 10.8*  --  12.2*  CREATININE 8.50* 8.60* 9.74* 4.30* 6.00*    Estimated Creatinine Clearance: 18.3 mL/min (A) (by C-G formula based on SCr of 6 mg/dL (H)).    Allergies  Allergen Reactions   Crestor [Rosuvastatin] Rash    Also had a red eye   Dilaudid [Hydromorphone] Itching   Benadryl [Diphenhydramine] Rash    Antimicrobials this admission: 5/24 Vanc >> 5/26 5/24 Zosyn >> 5/26  Microbiology results: 5/23 BCx:  5/23 MRSA PCR: negative  Thank you for allowing pharmacy to be a part of this patient's care.  Sherlon Handing, PharmD, BCPS Please see amion for complete clinical pharmacist phone list 01/09/2022 11:03 AM

## 2022-01-09 NOTE — Progress Notes (Addendum)
  Progress Note    01/09/2022 3:54 PM Hospital Day 2  Subjective:  sleeping-awakes to loud voice and patting his arm but falls back to sleep.  Tm 102.1  Vitals:   01/09/22 1257 01/09/22 1455  BP: (!) 94/57 (!) 91/53  Pulse: 60 (!) 55  Resp: 16 18  Temp: (!) 101.9 F (38.8 C) (!) 102.1 F (38.9 C)  SpO2: 90% 95%    Physical Exam: Extremities:  LLE unna boot removed.  There were blue sponges on two areas.  Those wounds appear to be healing.             CBC    Component Value Date/Time   WBC 12.2 (H) 01/09/2022 0309   RBC 2.25 (L) 01/09/2022 0309   HGB 7.4 (L) 01/09/2022 0309   HCT 22.4 (L) 01/09/2022 0309   PLT 225 01/09/2022 0309   MCV 99.6 01/09/2022 0309   MCH 32.9 01/09/2022 0309   MCHC 33.0 01/09/2022 0309   RDW 15.9 (H) 01/09/2022 0309   LYMPHSABS 0.6 (L) 01/09/2022 0309   MONOABS 1.0 01/09/2022 0309   EOSABS 0.2 01/09/2022 0309   BASOSABS 0.0 01/09/2022 0309    BMET    Component Value Date/Time   NA 134 (L) 01/09/2022 0309   NA 140 04/30/2019 1507   K 4.2 01/09/2022 0309   CL 96 (L) 01/09/2022 0309   CO2 24 01/09/2022 0309   GLUCOSE 122 (H) 01/09/2022 0309   BUN 26 (H) 01/09/2022 0309   BUN 37 (H) 04/30/2019 1507   CREATININE 6.00 (H) 01/09/2022 0309   CREATININE 1.70 (H) 06/15/2018 1458   CALCIUM 8.6 (L) 01/09/2022 0309   GFRNONAA 11 (L) 01/09/2022 0309   GFRAA 22 (L) 11/26/2019 1151    INR    Component Value Date/Time   INR 1.2 09/28/2020 0340     Intake/Output Summary (Last 24 hours) at 01/09/2022 1554 Last data filed at 01/08/2022 1609 Gross per 24 hour  Intake 300 ml  Output --  Net 300 ml     Assessment/Plan:  51 y.o. male who is s/p right AKA now with fever and we are asked to remove dressing from left leg to evaluate for source of fever Hospital Day 2  -left leg wounds appear to be healing.  Blue sponge was in place over two areas.  This leg does not appear to be the source of fever.  I placed Xeroform over the two  areas and placed kerlix and mild compression ace wrap back on the LLE.   Leontine Locket, PA-C [facility] [phone] 01/09/2022 3:54 PM  I have independently interviewed and examined patient and agree with PA assessment and plan above. No evidence of infection bilateral lower extremities.   Dacota Ruben C. Donzetta Matters, MD [facility] [place] Office: [phone] Pager: [phone]

## 2022-01-09 NOTE — Consult Note (Addendum)
NAME:  Edward [name], MRN:  [id number], DOB:  September 01, 1970, LOS: 2 ADMISSION DATE:  01/07/2022, CONSULTATION DATE:  5/24 REFERRING MD:  Candiss Norse, REASON FOR CONSULT:Hypotension   History of Present Illness:  Patient is encephalopathic. Therefore history has been obtained from chart review.   Edward [name], is a 51 y.o. male, who presented to [facility] on 5/22 for an elective bilateral lower extremity angiogram with Dr. Donzetta Matters.  He was admitted to the hospitalist service post procedure due to requirement of revision of his right lower extremity stump. He underwent a right AKA on 01/08/2022 with Dr. Donzetta Matters.  They have a pertinent past medical history of PAD status post left transmetatarsal amputation, right BKA with necrotic stump, ESRD (TTS), DM2, HLD, CAD with history of MI, combined systolic and diastolic heart failure, AAA, recent Zio patch placement.  PCCM was consulted on 5/24 for hypotension and altered mental status.  Documented blood pressure of 82/60. Tmax 102.4. Prior to arrival he was given 1.25 L IV fluid.  He was given Narcan 0.4x1.  Narcan 0.4 was repeated.  Hemoglobin had dropped by 2 g in the past 24 hours.  Patient on meropenem and vancomycin.   Pertinent  Medical History  PAD status post left transmetatarsal amputation, right BKA with necrotic stump, ESRD (TTS), DM2, HLD, CAD with history of MI, combined systolic and diastolic heart failurem AAA, recent Zio patch placement.  Significant Hospital Events: Including procedures, antibiotic start and stop dates in addition to other pertinent events   5/22 admit 5/23 AKA revision, vanc meropenem  5/24 PCCM consult  Interim History / Subjective:  See above  Unable to obtain subjective evaluation due to patient status  Objective   Blood pressure (!) 82/60, pulse 60, temperature (!) 102.4 F (39.1 C), temperature source Axillary, resp. rate 14, height _0  (1.854 m), weight 104.3 kg, SpO2 100 %.       No intake or output  data in the 24 hours ending 01/09/22 1655 Filed Weights   01/08/22 0808 01/08/22 1230 01/08/22 1259  Weight: 96.6 kg 104.3 kg 104.3 kg    Examination: General: In bed, NAD, appears comfortable HEENT: MM pink/moist, anicteric, atraumatic Neuro: RASS -4, PERRL 3m, localizes to pain, eyes open to pain, inappropriate  CV: S1S2, NSR, no m/r/g appreciated PULM:  clear in the upper lobes, clear in the lower lobes, trachea midline, chest expansion symmetric GI: soft, bsx4 active, non-tender   Extremities: RT AKA, L transmetatarsal amputation, extremities warm, no edema Skin: multiple wounds to LLE, RT AKA c/d/I,  no rashes or lesions noted  Labs Procalcitonin 1.16 > 8.75 Hemoglobin 10.5 > 9.5 > 7.4 WBC 10.8 > 12.2 CRP 21.8 Sodium 134 Creatinine 6, BUN 26 LFTs within normal limits Albumin 2.5 Anion gap within normal limits  Resolved Hospital Problem list     Assessment & Plan:  Hypotension Suspect multifactorial in the setting of sepsis, acute blood loss post surgery, ESRD. Procalcitonin 1.16 > 8.75. WBC 10.8 > 12.2. Tmax 102.4. Severe sepsis secondary to right stump infection -Transfer to ICU for closer monitoring. -Goal MAP greater than 65. Peripheral levophed ordered. Titrate medication to goal. -S/P 1.25 L  fluid resusitation.  Hold off on fluid or further fluid resuscitation in setting of ESRD and CHF. -Recheck CBC.  Transfuse if hemoglobin less than 7. -Albumin 5% 25G x1 -Obtain/Follow up BC and UC -On Meropenem and Vancomycin. Narrow as cultures result. -Check lactate  Acute metabolic encephalopathy Suspect secondary to sepsis with component  of chronic kidney disease.  0.8 Narcan given with no change. LFT WNL. 100% SPO2 on room air -Transfer to ICU for closer monitoring of mental status -Nephrology following -HD tomorrow on Thursday 5/25 if able -Stopping Oxy, tramadol, gabapentin  Right BKA, POD 1 HX PAD HX right transmetatarsal amputation Multiple amputation of  LUE fingers With Dr. Gwenlyn Saran.  Revision of stump -Management per vascular surgery.  ESRD on HD (TTS) -Nephrology following appreciate assistance -HD on Thursday 5/25.  PCCM available for assistance if patient remains hypotensive.  Acute blood loss anemia on anemia of chronic disease Hemoglobin 10.5 > 9.5 > 7.4 -Transfuse PRBC if HBG less than 7 -Obtain AM CBC to trend H&H -Monitor for signs of bleeding  CAD with ischemic cardiomyopathy Combined systolic and diastolic CHF HLD -Continue DAPT -Continue statin -Hold home antihypertensives at this time  DM2 -Blood Glucose goal 140-180. -SSI   Best Practice (right click and "Reselect all SmartList Selections" daily)   Diet/type: NPO DVT prophylaxis: prophylactic heparin  GI prophylaxis: PPI Lines: N/A Foley:  N/A Code Status:  full code Last date of multidisciplinary goals of care discussion [pending]  Labs   CBC: Recent Labs  Lab 01/07/22 1137 01/07/22 1822 01/08/22 0643 01/08/22 1314 01/09/22 0309  WBC  --  6.6 10.8*  --  12.2*  NEUTROABS  --   --   --   --  10.3*  HGB 10.2* 8.9* 10.5* 9.5* 7.4*  HCT 30.0* 26.6* 32.0* 28.0* 22.4*  MCV  --  97.4 99.1  --  99.6  PLT  --  283 278  --  762    Basic Metabolic Panel: Recent Labs  Lab 01/07/22 1137 01/07/22 1822 01/08/22 0543 01/08/22 0643 01/08/22 1314 01/09/22 0309  NA 133*  --   --  132* 133* 134*  K 3.9  --   --  5.5* 4.0 4.2  CL 93*  --   --  86* 97* 96*  CO2  --   --   --  21*  --  24  GLUCOSE 143*  --   --  102* 93 122*  BUN 44*  --   --  55* 18 26*  CREATININE 8.50* 8.60*  --  9.74* 4.30* 6.00*  CALCIUM  --   --   --  9.4  --  8.6*  MG  --   --   --   --   --  2.0  PHOS  --   --  9.1*  --   --   --    GFR: Estimated Creatinine Clearance: 18.3 mL/min (A) (by C-G formula based on SCr of 6 mg/dL (H)). Recent Labs  Lab 01/07/22 1822 01/08/22 0643 01/09/22 0309  PROCALCITON  --  1.16 8.75  WBC 6.6 10.8* 12.2*    Liver Function Tests: Recent  Labs  Lab 01/08/22 0543 01/09/22 0309  AST  --  35  ALT  --  17  ALKPHOS  --  70  BILITOT  --  1.0  PROT  --  6.3*  ALBUMIN 2.9* 2.5*   No results for input(s): LIPASE, AMYLASE in the last 168 hours. No results for input(s): AMMONIA in the last 168 hours.  ABG    Component Value Date/Time   PHART 7.403 08/14/2011 0557   PCO2ART 30.4 (L) 08/14/2011 0557   PO2ART 73.0 (L) 08/14/2011 0557   HCO3 26.6 09/06/2021 0325   TCO2 25 01/08/2022 1314   ACIDBASEDEF 5.0 (H) 08/14/2011 0557   O2SAT 99.0  09/06/2021 0325     Coagulation Profile: No results for input(s): INR, PROTIME in the last 168 hours.  Cardiac Enzymes: No results for input(s): CKTOTAL, CKMB, CKMBINDEX, TROPONINI in the last 168 hours.  HbA1C: Hgb A1c MFr Bld  Date/Time Value Ref Range Status  01/07/2022 06:22 PM 6.5 (H) 4.8 - 5.6 % Final    Comment:    (NOTE) Pre diabetes:          5.7%-6.4%  Diabetes:              >6.4%  Glycemic control for   <7.0% adults with diabetes   10/05/2021 05:00 AM 8.6 (H) 4.8 - 5.6 % Final    Comment:    (NOTE) Pre diabetes:          5.7%-6.4%  Diabetes:              >6.4%  Glycemic control for   <7.0% adults with diabetes     CBG: Recent Labs  Lab 01/09/22 0645 01/09/22 1030 01/09/22 1205 01/09/22 1505 01/09/22 1608  GLUCAP 121* 111* 98 54* 99    Review of Systems:   Unable to obtain ROS due to patient status  Past Medical History:  He,  has a past medical history of Allergy, Anemia, Anxiety, Arthritis, CHF (congestive heart failure) ([place]), Chronic kidney disease, Complication of anesthesia, Coronary artery disease, Depression, Diabetes mellitus, Dyspnea, GERD (gastroesophageal reflux disease), Hypertension, Myocardial infarction ([place]) (2019), and Sleep apnea.   Surgical History:   Past Surgical History:  Procedure Laterality Date   A/V FISTULAGRAM Left 03/06/2020   Procedure: A/V FISTULAGRAM;  Surgeon: Waynetta Sandy, MD;  Location: [place]  [facility];  Service: Cardiovascular;  Laterality: Left;   ABDOMINAL AORTOGRAM N/A 01/07/2022   Procedure: ABDOMINAL AORTOGRAM;  Surgeon: Waynetta Sandy, MD;  Location: [place] [facility];  Service: Cardiovascular;  Laterality: N/A;   ABDOMINAL AORTOGRAM W/LOWER EXTREMITY N/A 04/09/2021   Procedure: ABDOMINAL AORTOGRAM W/LOWER EXTREMITY;  Surgeon: Waynetta Sandy, MD;  Location: [place] [facility];  Service: Cardiovascular;  Laterality: N/A;   AMPUTATION Left 05/10/2016   Procedure: Left Transmetatarsal Amputation;  Surgeon: [name], MD;  Location: [place];  Service: Orthopedics;  Laterality: Left;   AMPUTATION Right 05/16/2018   Procedure: PARTIAL RAY AMPUTATION FIFTH TOE RIGHT FOOT;  Surgeon: [name], DPM;  Location: [place];  Service: Podiatry;  Laterality: Right;   AMPUTATION Right 06/17/2018   Procedure: AMPUTATION 4th RAY Right Foot;  Surgeon: Trula Slade, DPM;  Location: [place];  Service: Podiatry;  Laterality: Right;   AMPUTATION Right 06/19/2018   Procedure: RIGHT BELOW KNEE AMPUTATION;  Surgeon: [name], MD;  Location: [place];  Service: Orthopedics;  Laterality: Right;   AMPUTATION Left 10/05/2021   Procedure: LEFT RING FINGER AMPUTATION;  Surgeon: Daryll Brod, MD;  Location: [place];  Service: Orthopedics;  Laterality: Left;  45 MIN   AMPUTATION Left 11/15/2021   Procedure: LEFT INDEX FINGER AMPUTATION;  Surgeon: Leanora Cover, MD;  Location: [place];  Service: Orthopedics;  Laterality: Left;  60 MIN   AMPUTATION Right 01/08/2022   Procedure: AMPUTATION ABOVE KNEE;  Surgeon: Waynetta Sandy, MD;  Location: [place];  Service: Vascular;  Laterality: Right;   AORTIC ARCH ANGIOGRAPHY N/A 01/07/2022   Procedure: AORTIC ARCH ANGIOGRAPHY;  Surgeon: Waynetta Sandy, MD;  Location: [place] [facility];  Service: Cardiovascular;  Laterality: N/A;   APPLICATION OF WOUND VAC Right 06/19/2018   Procedure:  APPLICATION OF WOUND VAC;  Surgeon: [name],  MD;  Location: [place];  Service: Orthopedics;  Laterality: Right;   AV FISTULA PLACEMENT Left 11/25/2019   Procedure: LEFT ARM Brachiocephalic  ARTERIOVENOUS (AV) FISTULA CREATION;  Surgeon: Rosetta Posner, MD;  Location: [place];  Service: Vascular;  Laterality: Left;   [place] Left 07/19/2020   Procedure: LEFT SECOND STAGE [place];  Surgeon: Waynetta Sandy, MD;  Location: [place];  Service: Vascular;  Laterality: Left;   CARDIAC CATHETERIZATION  07/29/2018   CIRCUMCISION  09/02/2011   Procedure: CIRCUMCISION ADULT;  Surgeon: Molli Hazard, MD;  Location: [place];  Service: Urology;  Laterality: N/A;   CORONARY STENT INTERVENTION N/A 07/29/2018   Procedure: CORONARY STENT INTERVENTION;  Surgeon: Leonie Man, MD;  Location: [place] [facility];  Service: Cardiovascular;  Laterality: N/A;   DEBRIDEMENT AND CLOSURE WOUND Left 11/15/2021   Procedure: LEFT HAND WOUND CLOSURE;  Surgeon: Leanora Cover, MD;  Location: [place];  Service: Orthopedics;  Laterality: Left;   I & D EXTREMITY  09/02/2011   Procedure: IRRIGATION AND DEBRIDEMENT EXTREMITY;  Surgeon: Theodoro Kos, DO;  Location: [place];  Service: Plastics;  Laterality: N/A;   INCISION AND DRAINAGE OF WOUND  08/12/2011   Procedure: IRRIGATION AND DEBRIDEMENT WOUND;  Surgeon: Zenovia Jarred, MD;  Location: [place];  Service: General;;  perineum   INSERTION OF DIALYSIS CATHETER N/A 11/25/2019   Procedure: INSERTION OF Righ Internal Jugular DIALYSIS CATHETER;  Surgeon: Rosetta Posner, MD;  Location: [place];  Service: Vascular;  Laterality: N/A;   [place]  08/12/2011   Procedure: IRRIGATION AND DEBRIDEMENT ABSCESS;  Surgeon: Molli Hazard, MD;  Location: [place];  Service: Urology;  Laterality: N/A;  irrigation and debridment perineum     IRRIGATION AND DEBRIDEMENT ABSCESS  08/14/2011   Procedure: MINOR INCISION AND DRAINAGE OF ABSCESS;   Surgeon: Molli Hazard, MD;  Location: [place];  Service: Urology;  Laterality: N/A;  Perineal Wound Debridement;Placement Bilateral Testicular Thigh Pouches   LEFT HEART CATH AND CORONARY ANGIOGRAPHY N/A 07/29/2018   Procedure: LEFT HEART CATH AND CORONARY ANGIOGRAPHY;  Surgeon: Leonie Man, MD;  Location: [place] [facility];  Service: Cardiovascular;  Laterality: N/A;   LOWER EXTREMITY ANGIOGRAPHY Bilateral 01/07/2022   Procedure: Lower Extremity Angiography;  Surgeon: Waynetta Sandy, MD;  Location: [place] [facility];  Service: Cardiovascular;  Laterality: Bilateral;   PERIPHERAL VASCULAR ATHERECTOMY Left 04/09/2021   Procedure: PERIPHERAL VASCULAR ATHERECTOMY;  Surgeon: Waynetta Sandy, MD;  Location: [place] [facility];  Service: Cardiovascular;  Laterality: Left;  TP trunk and peroneal arteries   PERIPHERAL VASCULAR BALLOON ANGIOPLASTY Left 04/09/2021   Procedure: PERIPHERAL VASCULAR BALLOON ANGIOPLASTY;  Surgeon: Waynetta Sandy, MD;  Location: [place] [facility];  Service: Cardiovascular;  Laterality: Left;  Popliteal   REVISON OF ARTERIOVENOUS FISTULA Left 05/24/2020   Procedure: LEFT ARM ARTERIOVENOUS FISTULA  CONVERSION TO BASILIC VEIN FISTULA;  Surgeon: Waynetta Sandy, MD;  Location: [place];  Service: Vascular;  Laterality: Left;   TOTAL HIP ARTHROPLASTY Left 10/11/2016   Procedure: LEFT TOTAL HIP ARTHROPLASTY ANTERIOR APPROACH;  Surgeon: Mcarthur Rossetti, MD;  Location: [facility];  Service: Orthopedics;  Laterality: Left;   UPPER EXTREMITY ANGIOGRAPHY Bilateral 01/07/2022   Procedure: Upper Extremity Angiography;  Surgeon: Waynetta Sandy, MD;  Location: [place] [facility];  Service: Cardiovascular;  Laterality: Bilateral;  Social History:   reports that he quit smoking about 2 years ago. His smoking use included cigarettes. He has a 6.00 pack-year smoking history. He has never used smokeless tobacco. He reports that he does  not currently use alcohol. He reports that he does not use drugs.   Family History:  His family history includes Diabetes in an other family member; Pancreatic cancer (age of onset: 75) in his mother. There is no history of Colon cancer, Colon polyps, Esophageal cancer, Rectal cancer, or Stomach cancer.   Allergies Allergies  Allergen Reactions   Crestor [Rosuvastatin] Rash    Also had a red eye   Dilaudid [Hydromorphone] Itching   Benadryl [Diphenhydramine] Rash     Home Medications  Prior to Admission medications   Medication Sig Start Date End Date Taking? Authorizing Provider  acetaminophen (TYLENOL) 500 MG tablet Take 500-1,000 mg by mouth every 6 (six) hours as needed for moderate pain or headache.   Yes [provider]  aspirin EC 81 MG tablet Take 1 tablet (81 mg total) by mouth daily. Swallow whole. 03/14/20  Yes Lelon Perla, MD  atorvastatin (LIPITOR) 80 MG tablet Take 80 mg by mouth daily.   Yes [provider]  clopidogrel (PLAVIX) 75 MG tablet Take 1 tablet (75 mg total) by mouth daily. 04/09/21 04/09/22 Yes Waynetta Sandy, MD  gabapentin (NEURONTIN) 100 MG capsule Take 100 mg by mouth 3 (three) times daily. 12/03/21  Yes [provider]  insulin aspart protamine - aspart (NOVOLOG MIX 70/30 FLEXPEN) (70-30) 100 UNIT/ML FlexPen Inject 0.3 mLs (30 Units total) into the skin 2 (two) times daily with a meal. 09/29/20  Yes Lavina Hamman, MD  lidocaine-prilocaine (EMLA) cream Apply 1 application topically See admin instructions. Apply a small amount to access site (AVF) 1-2 hours before dialysis. Cover with occlusive dressing (saran wrap). Tuesday,Thursday,saturday 03/22/21  Yes [provider]  pantoprazole (PROTONIX) 40 MG tablet Take 1 tablet (40 mg total) by mouth in the morning and at bedtime. Patient taking differently: Take 40 mg by mouth daily. 08/29/21  Yes Willia Craze, NP  traMADol (ULTRAM) 50 MG tablet Take 50 mg by  mouth 2 (two) times daily as needed. 12/31/21  Yes [provider]  VELPHORO 500 MG chewable tablet Chew 1,000 mg by mouth 3 (three) times daily with meals. 06/14/20  Yes [provider]  bismuth subsalicylate (PEPTO BISMOL) 262 MG/15ML suspension Take 30 mLs by mouth every 6 (six) hours as needed for indigestion.    [provider]  blood glucose meter kit and supplies Dispense based on patient and insurance preference. Use up to four times daily as directed. (FOR ICD-9 250.00, 250.01). 07/01/18   Angiulli, Lavon Paganini, PA-C  Insulin Pen Needle 31G X 5 MM MISC Use daily to inject insulin as instructed 07/01/18   Angiulli, Lavon Paganini, PA-C  linaclotide (LINZESS) 72 MCG capsule Take 1 capsule (72 mcg total) by mouth daily before breakfast. Patient taking differently: Take 72 mcg by mouth daily as needed (constipation). 12/13/20   Armbruster, Carlota Raspberry, MD  triamcinolone ointment (KENALOG) 0.1 % 1 application. daily as needed (rash). 09/04/21   [provider]     Critical care time: 40 minutes    Redmond [employer]., MSN, APRN, AGACNP-BC [facility]  01/09/2022 , 5:27 PM  Please see [email or web address] for pager details  If no response, please call [phone] After hours, please call Elink at [phone]  PCCM:  51 yo M, PAD, s/p multiple amputations, now septic, fever 102, confused and hypotensive  BP (!) 92/49 (BP Location: Right Arm)   Pulse 62   Temp (!) 100.6 F (38.1 C) (Oral)   Resp 15   Ht _0  (1.854 m)   Wt 104.3 kg   SpO2 100%   BMI 30.34 kg/m   Gen: middles aged male,  Neuro: confused, opens eyes to voice  Heart: Regual[place], tachy  Lungs: CTAB, no wheeze  Labs reviewed   A:  Septic Shock Presumed bacteremia, possible infected stump S/p AKA  Acute metabolic encephalopathy  Right BKA, POD 1 PAD  Multiple amputations in the past  ESRD on HD  Acute blood loss anemia  CAD w/ ischemic heart disease  Chronic  systolic heart failure  DM2  P: IVF resuscitation  549m 5% albumin  IV Abx  DAPT  He may need pressors  Start NEPI, MAP >651m   This patient is critically ill with multiple organ system failure; which, requires frequent high complexity decision making, assessment, support, evaluation, and titration of therapies. This was completed through the application of advanced monitoring technologies and extensive interpretation of multiple databases. During this encounter critical care time was devoted to patient care services described in this note for 35 minutes.   [facility] 01/09/2022 5:47 PM

## 2022-01-09 NOTE — Progress Notes (Addendum)
PROGRESS NOTE                                                                                                                                                                                                             Patient Demographics:    Edward [name], is a 51 y.o. male, DOB - 1970/12/29, OQH:[id number]  Outpatient Primary MD for the patient is [name], MD    LOS - 2  Admit date - 01/07/2022    No chief complaint on file.      Brief Narrative (HPI from H&P)    51 y.o. male, history of severe PAD s/p left transmetatarsal amputation, right BKA with necrotic stump, multiple amputation in left hand fingers, ESRD on TTS schedule for dialysis, DM type 2 insulin-dependent, dyslipidemia, CAD with MI in past, combined systolic and diastolic CHF EF 54%, orthostatic hypotension of chronic disease, AAA, recent Zio patch placement, who was brought in to the Cath Lab for elective bilateral lower extremity angiogram which was done by Dr. Donzetta Matters to evaluate ongoing right BKA stump pain and necrosis, postprocedure hospitalist team was requested to admit the patient as he may require further revision of his right lower extremity stump.   Subjective:   Patient in bed, appears comfortable, denies any headache, no fever, no chest pain or pressure, no shortness of breath , no abdominal pain. No new focal weakness.   Assessment  & Plan :   1.  Right BKA stump site discomfort, history of severe PAD, with a right BKA stump necrosis and possible low-grade infection. he has undergone angiography of both lower extremities on 01/07/2022 by vascular surgery, currently on DAPT along with statin and Zetia for secondary prevention, this will be continued, underwent right AKA on 01/08/2022, overall clinically improved.  Blood cultures negative, however does have some elevation in CRP and still has low-grade fevers, could have low-grade cellulitis around the  amputation site, also has multiple necrosed digits in both upper extremities, trial of antibiotics for 3 days starting on 01/09/2022 and monitor.    Addendum note on 01/09/2022 4:35 PM.  Despite being on appropriate antibiotics patient around 2 PM on 01/09/2022 started spiking high fevers, blood cultures drawn thus far are negative, he is already on vancomycin and meropenem, his most likely source of infection the right BKA stump has been revised to  right AKA on 01/08/2022, he did have some surrounding cellulitis and likely became transiently bacteremic due to it.  He is hypotensive and now is developing some encephalopathy around 4 PM on 01/09/2022, no focal deficits supple neck.  I have given him a liter and a half of IV fluid bolus on 01/09/2022 however he still hypotensive and encephalopathic, I also pushed 0.4 of Narcan bedside however he is still encephalopathic, due to his ESRD status I am hesitant in pushing more fluids.  I have requested ICU to take a look and consider him moving to ICU immediately for pressor support and care for sepsis.  Most likely this will be a short stay as antibiotics kick in he should get better.  Case was also discussed with vascular surgeon Dr. Donzetta Matters multiple times today.    2.  Severe PAD requiring right BKA and now right AKA, left transmetatarsal amputation, multiple amputations of left upper extremity fingers -for PAD DAPT, Zetia and statin for secondary prevention.  Vascular surgery on board.  See above.   3.  ESRD.  On TTS schedule, has left arm AV fistula, renal consulted.   4.  CAD with ischemic cardiomyopathy.  No acute issues supportive care, continue DAPT with statin for secondary prevention, has chronic orthostatic hypotension precluding the use of beta-blockers.   5.  Chronic combined systolic and diastolic CHF with EF 09%.  Currently compensated, fluid removal via HD, blood pressure too low to allow beta-blocker, ACE/ARB use.   6.  Dyslipidemia.  LDL above  goal on max dose statin and Zetia added.   7.  Anemia of chronic disease.  Due to ESRD.  On iron supplementation with HD treatments, if hemoglobin drops below 7 to transfuse.   8. DM type II.  Insulin-dependent.  Home 7030 twice daily continued with sliding scale.  Stable for now.  Lab Results  Component Value Date   HGBA1C 6.5 (H) 01/07/2022   CBG (last 3)  Recent Labs    01/09/22 1205 01/09/22 1505 01/09/22 1608  GLUCAP 98 54* 99          Condition - Extremely Guarded  Family Communication  :    Called Son Fannie Knee [phone] - left message 01/09/22 - 4.50   Code Status :  Full  Consults  :  Renal, VVS, PCCM  PUD Prophylaxis : PPI   Procedures  :     Right AKA on 01/08/2022      Disposition Plan  :    Status is: Inpatient  DVT Prophylaxis  :    heparin injection 5,000 Units Start: 01/07/22 2200  Lab Results  Component Value Date   PLT 225 01/09/2022    Diet :  Diet Order             Diet renal/carb modified with fluid restriction Diet-HS Snack? Snack-yes; Fluid restriction: 1200 mL Fluid; Room service appropriate? Yes with Assist; Fluid consistency: Thin  Diet effective now                    Inpatient Medications  Scheduled Meds:  acetaminophen  650 mg Oral Once   aspirin EC  81 mg Oral Daily   aspirin  300 mg Rectal Once   atorvastatin  80 mg Oral Daily   Chlorhexidine Gluconate Cloth  6 each Topical Q0600   Chlorhexidine Gluconate Cloth  6 each Topical Q0600   clopidogrel  75 mg Oral Daily   [START ON 01/10/2022] darbepoetin (ARANESP) injection - DIALYSIS  100 mcg Intravenous Q Thu-HD   doxercalciferol  11 mcg Intravenous Q T,Th,Sa-HD   ezetimibe  10 mg Oral Daily   gabapentin  100 mg Oral TID   heparin  5,000 Units Subcutaneous Q8H   ibuprofen  400 mg Oral Once   insulin aspart  0-9 Units Subcutaneous TID WC   [START ON 01/10/2022] insulin aspart protamine- aspart  20 Units Subcutaneous BID WC   linaclotide  72 mcg Oral QAC  breakfast   oxyCODONE  10 mg Oral Q6H   pantoprazole  40 mg Oral Daily   polyethylene glycol  17 g Oral Daily   sucroferric oxyhydroxide  1,000 mg Oral TID WC   Continuous Infusions:  lactated ringers     meropenem (MERREM) IV     [START ON 01/10/2022] meropenem (MERREM) IV     [START ON 01/10/2022] vancomycin     PRN Meds:.acetaminophen, bisacodyl, bismuth subsalicylate, fentaNYL (SUBLIMAZE) injection, hydrALAZINE, labetalol, ondansetron (ZOFRAN) IV, ondansetron **OR** ondansetron (ZOFRAN) IV, oxyCODONE, traMADol  Antibiotics  :    Anti-infectives (From admission, onward)    Start     Dose/Rate Route Frequency Ordered Stop   01/10/22 2000  meropenem (MERREM) 500 mg in sodium chloride 0.9 % 100 mL IVPB        500 mg 200 mL/hr over 30 Minutes Intravenous Every 24 hours 01/09/22 1531     01/10/22 1200  vancomycin (VANCOCIN) IVPB 1000 mg/200 mL premix        1,000 mg 200 mL/hr over 60 Minutes Intravenous Every T-Th-Sa (Hemodialysis) 01/09/22 0954 01/12/22 1159   01/09/22 1615  meropenem (MERREM) 1 g in sodium chloride 0.9 % 100 mL IVPB        1 g 200 mL/hr over 30 Minutes Intravenous STAT 01/09/22 1529 01/10/22 1615   01/09/22 1045  vancomycin (VANCOREADY) IVPB 2000 mg/400 mL        2,000 mg 200 mL/hr over 120 Minutes Intravenous  Once 01/09/22 0954 01/09/22 1414   01/09/22 1015  piperacillin-tazobactam (ZOSYN) IVPB 2.25 g  Status:  Discontinued        2.25 g 100 mL/hr over 30 Minutes Intravenous Every 8 hours 01/09/22 0954 01/09/22 1529        Time Spent in minutes  [place] M.D on 01/09/2022 at 4:35 PM  To page go to [email or web address]   [facility] -  Office  [phone]  See all Orders from today for further details    Objective:   Vitals:   01/09/22 1257 01/09/22 1455 01/09/22 1605 01/09/22 1617  BP: (!) 94/57 (!) 91/53 (!) 88/50 (!) 91/52  Pulse: 60 (!) 55 (!) 50 (!) 50  Resp: '16 18 10 16  '$ Temp: (!) 101.9 F (38.8 C) (!) 102.1 F (38.9 C) (!)  101.5 F (38.6 C) (!) 101.6 F (38.7 C)  TempSrc: Oral Axillary Axillary Axillary  SpO2: 90% 95% 100% 100%  Weight:      Height:        Wt Readings from Last 3 Encounters:  01/08/22 104.3 kg  12/26/21 103.9 kg  11/28/21 104 kg    No intake or output data in the 24 hours ending 01/09/22 1635    Physical Exam  Awake Alert, No new F.N deficits, Normal affect Long Neck.AT,PERRAL Supple Neck, No JVD,   Symmetrical Chest wall movement, Good air movement bilaterally, CTAB RRR,No Gallops, Rubs or new Murmurs,  +ve B.Sounds, Abd Soft, No tenderness,   Right AKA stump site under bandage, right femoral cath  site stable under bandage, left transmetatarsal amputation old, multiple fingers in the left hand missing, few necrosed R. Hand fingers, wearing a splint in the right wrist, left arm AV fistula present    Data Review:    CBC Recent Labs  Lab 01/07/22 1137 01/07/22 1822 01/08/22 0643 01/08/22 1314 01/09/22 0309  WBC  --  6.6 10.8*  --  12.2*  HGB 10.2* 8.9* 10.5* 9.5* 7.4*  HCT 30.0* 26.6* 32.0* 28.0* 22.4*  PLT  --  283 278  --  225  MCV  --  97.4 99.1  --  99.6  MCH  --  32.6 32.5  --  32.9  MCHC  --  33.5 32.8  --  33.0  RDW  --  15.6* 15.9*  --  15.9*  LYMPHSABS  --   --   --   --  0.6*  MONOABS  --   --   --   --  1.0  EOSABS  --   --   --   --  0.2  BASOSABS  --   --   --   --  0.0    Electrolytes Recent Labs  Lab 01/07/22 1137 01/07/22 1822 01/08/22 0543 01/08/22 0643 01/08/22 1314 01/09/22 0309  NA 133*  --   --  132* 133* 134*  K 3.9  --   --  5.5* 4.0 4.2  CL 93*  --   --  86* 97* 96*  CO2  --   --   --  21*  --  24  GLUCOSE 143*  --   --  102* 93 122*  BUN 44*  --   --  55* 18 26*  CREATININE 8.50* 8.60*  --  9.74* 4.30* 6.00*  CALCIUM  --   --   --  9.4  --  8.6*  AST  --   --   --   --   --  35  ALT  --   --   --   --   --  17  ALKPHOS  --   --   --   --   --  70  BILITOT  --   --   --   --   --  1.0  ALBUMIN  --   --  2.9*  --   --  2.5*  MG   --   --   --   --   --  2.0  CRP  --   --   --  19.7*  --  21.8*  PROCALCITON  --   --   --  1.16  --  8.75  HGBA1C  --  6.5*  --   --   --   --     ------------------------------------------------------------------------------------------------------------------ Recent Labs    01/08/22 0643  CHOL 165  HDL 31*  LDLCALC 109*  TRIG 124  CHOLHDL 5.3    Lab Results  Component Value Date   HGBA1C 6.5 (H) 01/07/2022    No results for input(s): TSH, T4TOTAL, T3FREE, THYROIDAB in the last 72 hours.  Invalid input(s): FREET3 ------------------------------------------------------------------------------------------------------------------ ID Labs Recent Labs  Lab 01/07/22 1137 01/07/22 1822 01/08/22 0643 01/08/22 1314 01/09/22 0309  WBC  --  6.6 10.8*  --  12.2*  PLT  --  283 278  --  225  CRP  --   --  19.7*  --  21.8*  PROCALCITON  --   --  1.16  --  8.75  CREATININE 8.50* 8.60* 9.74* 4.30* 6.00*   Cardiac Enzymes No results for input(s): CKMB, TROPONINI, MYOGLOBIN in the last 168 hours.  Invalid input(s): CK  Micro Results Recent Results (from the past 240 hour(s))  MRSA Next Gen by PCR, Nasal     Status: None   Collection Time: 01/08/22  5:47 AM   Specimen: Nasal Mucosa; Nasal Swab  Result Value Ref Range Status   MRSA by PCR Next Gen NOT DETECTED NOT DETECTED Final    Comment: (NOTE) The GeneXpert MRSA Assay (FDA approved for NASAL specimens only), is one component of a comprehensive MRSA colonization surveillance program. It is not intended to diagnose MRSA infection nor to guide or monitor treatment for MRSA infections. Test performance is not FDA approved in patients less than 72 years old. Performed at [facility], [place] [address], [place], [place] [address]   Culture, blood (Routine X 2) w Reflex to ID Panel     Status: None (Preliminary result)   Collection Time: 01/08/22  6:44 AM   Specimen: BLOOD RIGHT WRIST  Result Value Ref Range Status    Specimen Description BLOOD RIGHT WRIST  Final   Special Requests   Final    BOTTLES DRAWN AEROBIC AND ANAEROBIC Blood Culture adequate volume   Culture   Final    NO GROWTH 1 DAY Performed at [facility], [place] [address] [place], [place] [address]    Report Status PENDING  Incomplete  Culture, blood (Routine X 2) w Reflex to ID Panel     Status: None (Preliminary result)   Collection Time: 01/08/22  6:52 AM   Specimen: BLOOD RIGHT FOREARM  Result Value Ref Range Status   Specimen Description BLOOD RIGHT FOREARM  Final   Special Requests   Final    BOTTLES DRAWN AEROBIC AND ANAEROBIC Blood Culture adequate volume   Culture   Final    NO GROWTH 1 DAY Performed at [facility], [place] [address], [place], [place] [address]    Report Status PENDING  Incomplete    Radiology Reports PERIPHERAL VASCULAR CATHETERIZATION  Result Date: 01/07/2022 Images from the original result were not included. Patient name: GARAN FRAPPIER MRN: [id number] DOB: 27-May-1971 Sex: male 01/07/2022 Pre-operative Diagnosis: End-stage renal disease, necrotic bilateral fingers, necrotic right below-knee amputation, left lower extremity wound Post-operative diagnosis:  Same Surgeon:  Eda Paschal. Donzetta Matters, MD Procedure Performed: 1.  Ultrasound-guided cannulation right common femoral artery 2.  Arch aortogram 3.  Selection of bilateral axillary arteries and upper bilateral extremity angiography 4.  Bilateral lower extremity angiography Indications: 51 year old male with history of right below-knee amputation which is now healing.  He also has amputations of the left fingers and now with new necrosis of fingers on the right.  Wound on the left foot is healing but the right below-knee amputation is necrotic.  He is now indicated for angiography and possible intervention of all extremities. Findings: The arch of his aorta appears to be a type I arch.  The right upper extremity there is a small lesion in the brachial artery just  after the takeoff of a high radial artery.  He does not have any ulnar artery flow to his right hand that is appreciable.  The radial artery has very sluggish flow down to the level of the wrist could not get any flow into the hand.  The interosseous artery flows toe about the level of the wrist and then stops.  No intervention was undertaken  there.  On the left side there is no flow-limiting stenosis throughout the upper extremity he does have radial artery dominance which goes to the level of the wrist at least but is difficult given the fistula.  If there is persistent wounds of the left hand we may need to consider ligation of the left arm fistula given that I do not think he would be a good candidate for any extensive revascularization of his left upper extremity.  The aorta in the abdomen is free of flow-limiting stenosis.  The SMA is patent.  Bilateral renal arteries consistent with dialysis.  Bilateral common iliac arteries are patent and there is disease in the bilateral hypogastric arteries but they are both patent.  On the right side the common femoral artery is patent with profunda flow in the SFA is patent down to the level of above the knee where there is no flow into the below-knee area consistent with necrosis of the below-knee amputation.  On the left side the common femoral and SFA are patent.  There is a 50% stenosis of the popliteal artery and then appears to have dominant flow via the peroneal artery to a transmetatarsal amputation on the left.  No intervention was undertaken. Plan will be for right above-knee amputation tomorrow in the OR.  Procedure:  The patient was identified in the holding area and taken to room 8.  The patient was then placed supine on the table and prepped and draped in the usual sterile fashion.  A time out was called.  Ultrasound was used to evaluate the right common femoral artery.  The area was thickened but we are able to anesthetize this area to cannulate the artery  with micropuncture needle followed by wire and sheath.  Images saved department record.  We placed a 5 French sheath over a Bentson wire followed by pigtail catheter to the aortic arch and patient is given 3000's of heparin.  We performed arch angiography then selected the bilateral subclavian and axillary arteries and performed bilateral upper extremity angiography without intervention.  We then put a pigtail in the abdominal aorta performed bilateral lower extremity angiography after aortic angiography and there is no intervention there either.  Plan will be for right above-knee amputation tomorrow in the OR.  Catheter was removed over wire.  Sheath will be pulled in postoperative holding.  He tolerated procedure without any complication. Contrast: 170cc Brandon C. Donzetta Matters, MD [facility] Office: [phone] Pager: [phone]   DG Chest Port 1 View  Result Date: 01/09/2022 CLINICAL DATA:  Shortness of breath EXAM: PORTABLE CHEST 1 VIEW COMPARISON:  Chest x-ray dated Jan 08, 2022 FINDINGS: Unchanged cardiomegaly. Mild bilateral heterogeneous opacities. No large pleural effusion or pneumothorax. IMPRESSION: 1. Mild bilateral heterogeneous opacities, likely due to pulmonary edema. 2. Unchanged cardiomegaly. Electronically Signed   By: Yetta Glassman M.D.   On: 01/09/2022 13:54   DG Chest Port 1 View  Result Date: 01/08/2022 CLINICAL DATA:  Shortness of breath. EXAM: PORTABLE CHEST 1 VIEW COMPARISON:  09/06/2021 FINDINGS: 0621 hours. The cardio pericardial silhouette is enlarged. The lungs are clear without focal pneumonia, edema, pneumothorax or pleural effusion. The visualized bony structures of the thorax are unremarkable. Telemetry leads overlie the chest. IMPRESSION: Stable enlargement of the cardiopericardial silhouette. Otherwise no acute findings. Electronically Signed   By: Misty Stanley M.D.   On: 01/08/2022 06:38

## 2022-01-10 DIAGNOSIS — I739 Peripheral vascular disease, unspecified: Secondary | ICD-10-CM | POA: Diagnosis not present

## 2022-01-10 LAB — COMPREHENSIVE METABOLIC PANEL
ALT: 15 U/L (ref 0–44)
AST: 48 U/L — ABNORMAL HIGH (ref 15–41)
Albumin: 2.5 g/dL — ABNORMAL LOW (ref 3.5–5.0)
Alkaline Phosphatase: 80 U/L (ref 38–126)
Anion gap: 18 — ABNORMAL HIGH (ref 5–15)
BUN: 39 mg/dL — ABNORMAL HIGH (ref 6–20)
CO2: 22 mmol/L (ref 22–32)
Calcium: 8.8 mg/dL — ABNORMAL LOW (ref 8.9–10.3)
Chloride: 95 mmol/L — ABNORMAL LOW (ref 98–111)
Creatinine, Ser: 8.17 mg/dL — ABNORMAL HIGH (ref 0.61–1.24)
GFR, Estimated: 7 mL/min — ABNORMAL LOW (ref >60)
Glucose, Bld: 88 mg/dL (ref 70–99)
Potassium: 4 mmol/L (ref 3.5–5.1)
Sodium: 135 mmol/L (ref 135–145)
Total Bilirubin: 1.5 mg/dL — ABNORMAL HIGH (ref 0.3–1.2)
Total Protein: 6.3 g/dL — ABNORMAL LOW (ref 6.5–8.1)

## 2022-01-10 LAB — CBC WITH DIFFERENTIAL/PLATELET
Abs Immature Granulocytes: 0.06 10*3/uL (ref 0.00–0.07)
Basophils Absolute: 0 10*3/uL (ref 0.0–0.1)
Basophils Relative: 0 %
Eosinophils Absolute: 0.1 10*3/uL (ref 0.0–0.5)
Eosinophils Relative: 1 %
HCT: 22.6 % — ABNORMAL LOW (ref 39.0–52.0)
Hemoglobin: 7.5 g/dL — ABNORMAL LOW (ref 13.0–17.0)
Immature Granulocytes: 1 %
Lymphocytes Relative: 6 %
Lymphs Abs: 0.6 10*3/uL — ABNORMAL LOW (ref 0.7–4.0)
MCH: 32.5 pg (ref 26.0–34.0)
MCHC: 33.2 g/dL (ref 30.0–36.0)
MCV: 97.8 fL (ref 80.0–100.0)
Monocytes Absolute: 0.9 10*3/uL (ref 0.1–1.0)
Monocytes Relative: 8 %
Neutro Abs: 8.8 10*3/uL — ABNORMAL HIGH (ref 1.7–7.7)
Neutrophils Relative %: 84 %
Platelets: 201 10*3/uL (ref 150–400)
RBC: 2.31 MIL/uL — ABNORMAL LOW (ref 4.22–5.81)
RDW: 17 % — ABNORMAL HIGH (ref 11.5–15.5)
WBC: 10.5 10*3/uL (ref 4.0–10.5)
nRBC: 0 % (ref 0.0–0.2)

## 2022-01-10 LAB — MAGNESIUM: Magnesium: 2.1 mg/dL (ref 1.7–2.4)

## 2022-01-10 LAB — C-REACTIVE PROTEIN: CRP: 25.5 mg/dL — ABNORMAL HIGH (ref <1.0)

## 2022-01-10 LAB — GLUCOSE, CAPILLARY
Glucose-Capillary: 149 mg/dL — ABNORMAL HIGH (ref 70–99)
Glucose-Capillary: 160 mg/dL — ABNORMAL HIGH (ref 70–99)
Glucose-Capillary: 79 mg/dL (ref 70–99)
Glucose-Capillary: 80 mg/dL (ref 70–99)
Glucose-Capillary: 82 mg/dL (ref 70–99)
Glucose-Capillary: 85 mg/dL (ref 70–99)

## 2022-01-10 LAB — PREPARE RBC (CROSSMATCH)

## 2022-01-10 LAB — CBC
HCT: 22.6 % — ABNORMAL LOW (ref 39.0–52.0)
Hemoglobin: 7.6 g/dL — ABNORMAL LOW (ref 13.0–17.0)
MCH: 32.8 pg (ref 26.0–34.0)
MCHC: 33.6 g/dL (ref 30.0–36.0)
MCV: 97.4 fL (ref 80.0–100.0)
Platelets: 214 10*3/uL (ref 150–400)
RBC: 2.32 MIL/uL — ABNORMAL LOW (ref 4.22–5.81)
RDW: 17 % — ABNORMAL HIGH (ref 11.5–15.5)
WBC: 11.8 10*3/uL — ABNORMAL HIGH (ref 4.0–10.5)
nRBC: 0 % (ref 0.0–0.2)

## 2022-01-10 LAB — LACTIC ACID, PLASMA: Lactic Acid, Venous: 1.6 mmol/L (ref 0.5–1.9)

## 2022-01-10 LAB — PROCALCITONIN: Procalcitonin: 13.89 ng/mL

## 2022-01-10 MED ORDER — PANTOPRAZOLE SODIUM 40 MG PO TBEC
40.0000 mg | DELAYED_RELEASE_TABLET | Freq: Two times a day (BID) | ORAL | Status: DC
Start: 2022-01-10 — End: 2022-01-16
  Administered 2022-01-10 – 2022-01-16 (×12): 40 mg via ORAL
  Filled 2022-01-10 (×12): qty 1

## 2022-01-10 MED ORDER — GABAPENTIN 100 MG PO CAPS
100.0000 mg | ORAL_CAPSULE | Freq: Two times a day (BID) | ORAL | Status: DC
  Administered 2022-01-10 – 2022-01-16 (×11): 100 mg via ORAL
  Filled 2022-01-10 (×11): qty 1

## 2022-01-10 MED ORDER — SODIUM CHLORIDE 0.9% IV SOLUTION: Freq: Once | INTRAVENOUS | Status: DC

## 2022-01-10 MED ORDER — FENTANYL CITRATE PF 50 MCG/ML IJ SOSY
50.0000 ug | PREFILLED_SYRINGE | INTRAMUSCULAR | Status: DC | PRN
  Administered 2022-01-10 – 2022-01-16 (×6): 50 ug via INTRAVENOUS
  Filled 2022-01-10 (×7): qty 1

## 2022-01-10 MED ORDER — INSULIN ASPART PROT & ASPART (70-30 MIX) 100 UNIT/ML ~~LOC~~ SUSP
15.0000 [IU] | Freq: Two times a day (BID) | SUBCUTANEOUS | Status: DC
  Administered 2022-01-10 – 2022-01-11 (×2): 15 [IU] via SUBCUTANEOUS
  Filled 2022-01-10: qty 10

## 2022-01-10 MED ORDER — TRAMADOL HCL 50 MG PO TABS
50.0000 mg | ORAL_TABLET | Freq: Two times a day (BID) | ORAL | Status: DC | PRN
  Administered 2022-01-10 – 2022-01-16 (×6): 50 mg via ORAL
  Filled 2022-01-10 (×7): qty 1

## 2022-01-10 MED ORDER — ACETAMINOPHEN 500 MG PO TABS
1000.0000 mg | ORAL_TABLET | Freq: Four times a day (QID) | ORAL | Status: AC
  Administered 2022-01-10 – 2022-01-12 (×8): 1000 mg via ORAL
  Filled 2022-01-10 (×8): qty 2

## 2022-01-10 NOTE — Plan of Care (Signed)
  Problem: Clinical Measurements: Goal: Cardiovascular complication will be avoided Outcome: Progressing   Problem: Elimination: Goal: Will not experience complications related to bowel motility Outcome: Progressing   Problem: Skin Integrity: Goal: Risk for impaired skin integrity will decrease Outcome: Progressing

## 2022-01-10 NOTE — Progress Notes (Signed)
eLink Physician-Brief Progress Note Patient Name: Edward [name] DOB: Aug 26, 1970 MRN: [id number]   Date of Service  01/10/2022  HPI/Events of Note  Hgb 6.9.  No obvious signs of active bleeding.  eICU Interventions  Transfuse 1 unit prbc     Intervention Category Intermediate Interventions: Bleeding - evaluation and treatment with blood products  MAXWELL, LEMEN, P 01/10/2022, 12:24 AM

## 2022-01-10 NOTE — Progress Notes (Signed)
Called HD to ask about 1200 meds.... was told not to give until pt gets his HD and HD nurse will administer when ready.

## 2022-01-10 NOTE — Progress Notes (Signed)
PROGRESS NOTE                                                                                                                                                                                                             Patient Demographics:    Edward [name], is a 51 y.o. male, DOB - December 07, 1970, CZY:[id number]  Outpatient Primary MD for the patient is [name], MD    LOS - 3  Admit date - 01/07/2022    No chief complaint on file.      Brief Narrative (HPI from H&P)    51 y.o. male, history of severe PAD s/p left transmetatarsal amputation, right BKA with necrotic stump, multiple amputation in left hand fingers, ESRD on TTS schedule for dialysis, DM type 2 insulin-dependent, dyslipidemia, CAD with MI in past, combined systolic and diastolic CHF EF 09%, orthostatic hypotension of chronic disease, AAA, recent Zio patch placement, who was brought in to the Cath Lab for elective bilateral lower extremity angiogram which was done by Dr. Donzetta Matters to evaluate ongoing right BKA stump pain and necrosis, postprocedure hospitalist team was requested to admit the patient as he may require further revision of his right lower extremity stump.   Subjective:   Patient in bed, appears comfortable, denies any headache, no fever, no chest pain or pressure, no shortness of breath , no abdominal pain. No new focal weakness.   Assessment  & Plan :   1.  Right BKA stump site discomfort, history of severe PAD, with a right BKA stump necrosis and post AKA Sepsis. he underwent right AKA on 01/08/2022, he had minimal to no signs of infection at that time, on 01/10/2019 3 in the morning he had low-grade fever with rising CRP and procalcitonin, blood cultures were drawn, he was started on vancomycin and meropenem however later that afternoon on 01/09/2022 he spiked a high-grade fever and developed severe toxic encephalopathy due to sepsis.  He received 1-1/2 L of  fluid bolus, he was transferred briefly to ICU, fortunately by midnight as the antibiotics kicked in his sepsis pathophysiology has resolved, he is symptom-free morning of 01/10/2022.  Blood cultures so far remain negative.  We will continue Vancomycin and meropenem for a total of 7 days unless cultures guide otherwise.  Appreciate PCCM input.  For PAD continue dual antiplatelet therapy along with statin  and Zetia.  Vascular surgery is following his postop stump site appears clean.   2.  Severe PAD requiring right BKA and now right AKA, left transmetatarsal amputation, multiple amputations of left upper extremity fingers -for PAD DAPT, Zetia and statin for secondary prevention.  Vascular surgery on board.  See above.   3.  ESRD.  On TTS schedule, has left arm AV fistula, renal consulted.   4.  CAD with ischemic cardiomyopathy.  No acute issues supportive care, continue DAPT with statin for secondary prevention, has chronic orthostatic hypotension precluding the use of beta-blockers.   5.  Chronic combined systolic and diastolic CHF with EF 36%.  Currently compensated, fluid removal via HD, blood pressure too low to allow beta-blocker, ACE/ARB use.   6.  Dyslipidemia.  LDL above goal on max dose statin and Zetia added.   7.  Anemia of chronic disease with some perioperative blood loss related anemia along with heme dilution from IV fluids.  Due to ESRD.  On iron supplementation with HD treatments, received 1 unit of packed RBC early morning 01/10/2022, monitor H&H closely, no other signs of ongoing bleeding, he is symptom-free, currently on twice daily PPI as well.  Will monitor H&H closely.  8. DM type II.  Insulin-dependent.  Blood sugars running low's 7030 dose reduced continue sliding scale before every meal only.  Lab Results  Component Value Date   HGBA1C 6.5 (H) 01/07/2022   CBG (last 3)  Recent Labs    01/09/22 1608 01/09/22 2114 01/10/22 0056  GLUCAP 99 85 82           Condition - Extremely Guarded  Family Communication  :    Called Son Edward [name] [phone] - left message 01/09/22 - 4.50   Code Status :  Full  Consults  :  Renal, VVS, PCCM  PUD Prophylaxis : PPI   Procedures  :     Right AKA on 01/08/2022      Disposition Plan  :    Status is: Inpatient  DVT Prophylaxis  :    heparin injection 5,000 Units Start: 01/07/22 2200  Lab Results  Component Value Date   PLT 201 01/10/2022    Diet :  Diet Order             Diet renal/carb modified with fluid restriction Diet-HS Snack? Snack-yes; Fluid restriction: 1200 mL Fluid; Room service appropriate? Yes with Assist; Fluid consistency: Thin  Diet effective now                    Inpatient Medications  Scheduled Meds:  acetaminophen  650 mg Oral Once   aspirin EC  81 mg Oral Daily   atorvastatin  80 mg Oral Daily   Chlorhexidine Gluconate Cloth  6 each Topical Q0600   Chlorhexidine Gluconate Cloth  6 each Topical Q0600   clopidogrel  75 mg Oral Daily   darbepoetin (ARANESP) injection - DIALYSIS  100 mcg Intravenous Q Thu-HD   doxercalciferol  11 mcg Intravenous Q T,Th,Sa-HD   ezetimibe  10 mg Oral Daily   heparin  5,000 Units Subcutaneous Q8H   insulin aspart  0-9 Units Subcutaneous TID WC   insulin aspart protamine- aspart  15 Units Subcutaneous BID WC   linaclotide  72 mcg Oral QAC breakfast   pantoprazole  40 mg Oral BID   polyethylene glycol  17 g Oral Daily   sucroferric oxyhydroxide  1,000 mg Oral TID WC   Continuous Infusions:  sodium chloride  meropenem (MERREM) IV     norepinephrine (LEVOPHED) Adult infusion     vancomycin     PRN Meds:.acetaminophen, bisacodyl, bismuth subsalicylate, fentaNYL (SUBLIMAZE) injection, hydrALAZINE, labetalol, naLOXone (NARCAN)  injection, ondansetron (ZOFRAN) IV, [DISCONTINUED] ondansetron **OR** ondansetron (ZOFRAN) IV  Antibiotics  :    Anti-infectives (From admission, onward)    Start     Dose/Rate Route Frequency  Ordered Stop   01/10/22 2000  meropenem (MERREM) 500 mg in sodium chloride 0.9 % 100 mL IVPB        500 mg 200 mL/hr over 30 Minutes Intravenous Every 24 hours 01/09/22 1531     01/10/22 1200  vancomycin (VANCOCIN) IVPB 1000 mg/200 mL premix        1,000 mg 200 mL/hr over 60 Minutes Intravenous Every T-Th-Sa (Hemodialysis) 01/09/22 0954 01/12/22 1159   01/09/22 1615  meropenem (MERREM) 1 g in sodium chloride 0.9 % 100 mL IVPB        1 g 200 mL/hr over 30 Minutes Intravenous STAT 01/09/22 1529 01/09/22 1726   01/09/22 1045  vancomycin (VANCOREADY) IVPB 2000 mg/400 mL        2,000 mg 200 mL/hr over 120 Minutes Intravenous  Once 01/09/22 0954 01/09/22 1414   01/09/22 1015  piperacillin-tazobactam (ZOSYN) IVPB 2.25 g  Status:  Discontinued        2.25 g 100 mL/hr over 30 Minutes Intravenous Every 8 hours 01/09/22 0954 01/09/22 1529        Time Spent in minutes  30   Lala Lund M.D on 01/10/2022 at 8:13 AM  To page go to [email or web address]   [facility] -  Office  [phone]  See all Orders from today for further details    Objective:   Vitals:   01/10/22 0516 01/10/22 0530 01/10/22 0600 01/10/22 0747  BP:  133/83 121/65   Pulse: 75 75 70   Resp: 16 17 (!) 22   Temp:    98.2 F (36.8 C)  TempSrc:    Oral  SpO2: 96% 100% 100%   Weight:      Height:        Wt Readings from Last 3 Encounters:  01/08/22 104.3 kg  12/26/21 103.9 kg  11/28/21 104 kg     Intake/Output Summary (Last 24 hours) at 01/10/2022 0813 Last data filed at 01/10/2022 0545 Gross per 24 hour  Intake 815.46 ml  Output --  Net 815.46 ml      Physical Exam  Awake Alert, No new F.N deficits, Normal affect Marty.AT,PERRAL Supple Neck, No JVD,   Symmetrical Chest wall movement, Good air movement bilaterally, CTAB RRR,No Gallops, Rubs or new Murmurs,  +ve B.Sounds, Abd Soft, No tenderness,   Right AKA stump site under bandage, right femoral cath site stable under bandage, left  transmetatarsal amputation old, multiple fingers in the left hand missing, few necrosed R. Hand fingers, wearing a splint in the right wrist, left arm AV fistula present    Data Review:    CBC Recent Labs  Lab 01/07/22 1822 01/08/22 0643 01/08/22 1314 01/09/22 0309 01/09/22 2245 01/10/22 0639  WBC 6.6 10.8*  --  12.2* 11.2* 10.5  HGB 8.9* 10.5* 9.5* 7.4* 6.9* 7.5*  HCT 26.6* 32.0* 28.0* 22.4* 22.0* 22.6*  PLT 283 278  --  225 210 201  MCV 97.4 99.1  --  99.6 100.9* 97.8  MCH 32.6 32.5  --  32.9 31.7 32.5  MCHC 33.5 32.8  --  33.0 31.4 33.2  RDW 15.6* 15.9*  --  15.9* 15.9* 17.0*  LYMPHSABS  --   --   --  0.6*  --  0.6*  MONOABS  --   --   --  1.0  --  0.9  EOSABS  --   --   --  0.2  --  0.1  BASOSABS  --   --   --  0.0  --  0.0    Electrolytes Recent Labs  Lab 01/07/22 1137 01/07/22 1822 01/08/22 0543 01/08/22 0643 01/08/22 1314 01/09/22 0309 01/09/22 1803 01/09/22 2245 01/10/22 0639  NA 133*  --   --  132* 133* 134*  --   --  135  K 3.9  --   --  5.5* 4.0 4.2  --   --  4.0  CL 93*  --   --  86* 97* 96*  --   --  95*  CO2  --   --   --  21*  --  24  --   --  22  GLUCOSE 143*  --   --  102* 93 122*  --   --  88  BUN 44*  --   --  55* 18 26*  --   --  39*  CREATININE 8.50* 8.60*  --  9.74* 4.30* 6.00*  --   --  8.17*  CALCIUM  --   --   --  9.4  --  8.6*  --   --  8.8*  AST  --   --   --   --   --  35  --   --  48*  ALT  --   --   --   --   --  17  --   --  15  ALKPHOS  --   --   --   --   --  70  --   --  80  BILITOT  --   --   --   --   --  1.0  --   --  1.5*  ALBUMIN  --   --  2.9*  --   --  2.5*  --   --  2.5*  MG  --   --   --   --   --  2.0  --   --  2.1  CRP  --   --   --  19.7*  --  21.8*  --   --   --   PROCALCITON  --   --   --  1.16  --  8.75  --   --   --   LATICACIDVEN  --   --   --   --   --   --  5.8* 1.6  --   HGBA1C  --  6.5*  --   --   --   --   --   --   --      ------------------------------------------------------------------------------------------------------------------ Recent Labs    01/08/22 0643  CHOL 165  HDL 31*  LDLCALC 109*  TRIG 124  CHOLHDL 5.3    Lab Results  Component Value Date   HGBA1C 6.5 (H) 01/07/2022    No results for input(s): TSH, T4TOTAL, T3FREE, THYROIDAB in the last 72 hours.  Invalid input(s): FREET3 ------------------------------------------------------------------------------------------------------------------ ID Labs Recent Labs  Lab 01/07/22 1822 01/08/22 7672 01/08/22 1314 01/09/22 0309 01/09/22 1803 01/09/22 2245 01/10/22 0639  WBC 6.6 10.8*  --  12.2*  --  11.2* 10.5  PLT 283  278  --  225  --  210 201  CRP  --  19.7*  --  21.8*  --   --   --   PROCALCITON  --  1.16  --  8.75  --   --   --   LATICACIDVEN  --   --   --   --  5.8* 1.6  --   CREATININE 8.60* 9.74* 4.30* 6.00*  --   --  8.17*   Cardiac Enzymes No results for input(s): CKMB, TROPONINI, MYOGLOBIN in the last 168 hours.  Invalid input(s): CK  Micro Results Recent Results (from the past 240 hour(s))  MRSA Next Gen by PCR, Nasal     Status: None   Collection Time: 01/08/22  5:47 AM   Specimen: Nasal Mucosa; Nasal Swab  Result Value Ref Range Status   MRSA by PCR Next Gen NOT DETECTED NOT DETECTED Final    Comment: (NOTE) The GeneXpert MRSA Assay (FDA approved for NASAL specimens only), is one component of a comprehensive MRSA colonization surveillance program. It is not intended to diagnose MRSA infection nor to guide or monitor treatment for MRSA infections. Test performance is not FDA approved in patients less than 78 years old. Performed at [facility], [place] [address] [place], [place] [address]   Culture, blood (Routine X 2) w Reflex to ID Panel     Status: None (Preliminary result)   Collection Time: 01/08/22  6:44 AM   Specimen: BLOOD RIGHT WRIST  Result Value Ref Range Status   Specimen Description  BLOOD RIGHT WRIST  Final   Special Requests   Final    BOTTLES DRAWN AEROBIC AND ANAEROBIC Blood Culture adequate volume   Culture   Final    NO GROWTH 1 DAY Performed at [facility], [place] [address], [place], [place] [address]    Report Status PENDING  Incomplete  Culture, blood (Routine X 2) w Reflex to ID Panel     Status: None (Preliminary result)   Collection Time: 01/08/22  6:52 AM   Specimen: BLOOD RIGHT FOREARM  Result Value Ref Range Status   Specimen Description BLOOD RIGHT FOREARM  Final   Special Requests   Final    BOTTLES DRAWN AEROBIC AND ANAEROBIC Blood Culture adequate volume   Culture   Final    NO GROWTH 1 DAY Performed at [facility], [place] [address] [place], [place] [address]    Report Status PENDING  Incomplete  MRSA Next Gen by PCR, Nasal     Status: None   Collection Time: 01/09/22  6:03 PM   Specimen: Nasal Mucosa; Nasal Swab  Result Value Ref Range Status   MRSA by PCR Next Gen NOT DETECTED NOT DETECTED Final    Comment: (NOTE) The GeneXpert MRSA Assay (FDA approved for NASAL specimens only), is one component of a comprehensive MRSA colonization surveillance program. It is not intended to diagnose MRSA infection nor to guide or monitor treatment for MRSA infections. Test performance is not FDA approved in patients less than 95 years old. Performed at [facility], [place] [address], [place], [place] [address]     Radiology Reports PERIPHERAL VASCULAR CATHETERIZATION  Result Date: 01/07/2022 Images from the original result were not included. Patient name: DELL BRINER MRN: [id number] DOB: Jun 09, 1971 Sex: male 01/07/2022 Pre-operative Diagnosis: End-stage renal disease, necrotic bilateral fingers, necrotic right below-[name] amputation, left lower extremity wound Post-operative diagnosis:  Same Surgeon:  Eda Paschal. Donzetta Matters,  MD Procedure Performed: 1.  Ultrasound-guided cannulation right common femoral artery 2.  Arch aortogram 3.  Selection of  bilateral axillary arteries and upper bilateral extremity angiography 4.  Bilateral lower extremity angiography Indications: 51 year old male with history of right below-[name] amputation which is now healing.  He also has amputations of the left fingers and now with new necrosis of fingers on the right.  Wound on the left foot is healing but the right below-[name] amputation is necrotic.  He is now indicated for angiography and possible intervention of all extremities. Findings: The arch of his aorta appears to be a type I arch.  The right upper extremity there is a small lesion in the brachial artery just after the takeoff of a high radial artery.  He does not have any ulnar artery flow to his right hand that is appreciable.  The radial artery has very sluggish flow down to the level of the wrist could not get any flow into the hand.  The interosseous artery flows toe about the level of the wrist and then stops.  No intervention was undertaken there.  On the left side there is no flow-limiting stenosis throughout the upper extremity he does have radial artery dominance which goes to the level of the wrist at least but is difficult given the fistula.  If there is persistent wounds of the left hand we may need to consider ligation of the left arm fistula given that I do not think he would be a good candidate for any extensive revascularization of his left upper extremity.  The aorta in the abdomen is free of flow-limiting stenosis.  The SMA is patent.  Bilateral renal arteries consistent with dialysis.  Bilateral common iliac arteries are patent and there is disease in the bilateral hypogastric arteries but they are both patent.  On the right side the common femoral artery is patent with profunda flow in the SFA is patent down to the level of above the [name] where there is no flow into the below-[name] area consistent with necrosis of the below-[name] amputation.  On the left side the common femoral and SFA are patent.   There is a 50% stenosis of the popliteal artery and then appears to have dominant flow via the peroneal artery to a transmetatarsal amputation on the left.  No intervention was undertaken. Plan will be for right above-[name] amputation tomorrow in the OR.  Procedure:  The patient was identified in the holding area and taken to room 8.  The patient was then placed supine on the table and prepped and draped in the usual sterile fashion.  A time out was called.  Ultrasound was used to evaluate the right common femoral artery.  The area was thickened but we are able to anesthetize this area to cannulate the artery with micropuncture needle followed by wire and sheath.  Images saved department record.  We placed a 5 French sheath over a Bentson wire followed by pigtail catheter to the aortic arch and patient is given 3000's of heparin.  We performed arch angiography then selected the bilateral subclavian and axillary arteries and performed bilateral upper extremity angiography without intervention.  We then put a pigtail in the abdominal aorta performed bilateral lower extremity angiography after aortic angiography and there is no intervention there either.  Plan will be for right above-[name] amputation tomorrow in the OR.  Catheter was removed over wire.  Sheath will be pulled in postoperative holding.  He tolerated procedure without any complication. Contrast:  170cc Brandon C. Donzetta Matters, MD [facility] Office: [phone] Pager: [phone]   DG Chest Port 1 View  Result Date: 01/09/2022 CLINICAL DATA:  Shortness of breath EXAM: PORTABLE CHEST 1 VIEW COMPARISON:  Chest x-ray dated Jan 08, 2022 FINDINGS: Unchanged cardiomegaly. Mild bilateral heterogeneous opacities. No large pleural effusion or pneumothorax. IMPRESSION: 1. Mild bilateral heterogeneous opacities, likely due to pulmonary edema. 2. Unchanged cardiomegaly. Electronically Signed   By: Yetta Glassman M.D.   On: 01/09/2022  13:54   DG Chest Port 1 View  Result Date: 01/08/2022 CLINICAL DATA:  Shortness of breath. EXAM: PORTABLE CHEST 1 VIEW COMPARISON:  09/06/2021 FINDINGS: 0621 hours. The cardio pericardial silhouette is enlarged. The lungs are clear without focal pneumonia, edema, pneumothorax or pleural effusion. The visualized bony structures of the thorax are unremarkable. Telemetry leads overlie the chest. IMPRESSION: Stable enlargement of the cardiopericardial silhouette. Otherwise no acute findings. Electronically Signed   By: Misty Stanley M.D.   On: 01/08/2022 06:38

## 2022-01-10 NOTE — Progress Notes (Addendum)
  Progress Note    01/10/2022 7:39 AM 2 Days Post-Op  Subjective:  alert this morning.  Minimal pain R AKA incision   Vitals:   01/10/22 0530 01/10/22 0600  BP: 133/83 121/65  Pulse: 75 70  Resp: 17 (!) 22  Temp:    SpO2: 100% 100%   Physical Exam: Lungs:  non labored Incisions:  R AKA incision with viable skin edges, no drainage Extremities:  LLE wrap left in place; exposed bone L index finger Neurologic: A&O  CBC    Component Value Date/Time   WBC 10.5 01/10/2022 0639   RBC 2.31 (L) 01/10/2022 0639   HGB 7.5 (L) 01/10/2022 0639   HCT 22.6 (L) 01/10/2022 0639   PLT 201 01/10/2022 0639   MCV 97.8 01/10/2022 0639   MCH 32.5 01/10/2022 0639   MCHC 33.2 01/10/2022 0639   RDW 17.0 (H) 01/10/2022 0639   LYMPHSABS 0.6 (L) 01/10/2022 0639   MONOABS 0.9 01/10/2022 0639   EOSABS 0.1 01/10/2022 0639   BASOSABS 0.0 01/10/2022 0639    BMET    Component Value Date/Time   NA 134 (L) 01/09/2022 0309   NA 140 04/30/2019 1507   K 4.2 01/09/2022 0309   CL 96 (L) 01/09/2022 0309   CO2 24 01/09/2022 0309   GLUCOSE 122 (H) 01/09/2022 0309   BUN 26 (H) 01/09/2022 0309   BUN 37 (H) 04/30/2019 1507   CREATININE 6.00 (H) 01/09/2022 0309   CREATININE 1.70 (H) 06/15/2018 1458   CALCIUM 8.6 (L) 01/09/2022 0309   GFRNONAA 11 (L) 01/09/2022 0309   GFRAA 22 (L) 11/26/2019 1151    INR    Component Value Date/Time   INR 1.2 09/28/2020 0340     Intake/Output Summary (Last 24 hours) at 01/10/2022 0739 Last data filed at 01/10/2022 0545 Gross per 24 hour  Intake 815.46 ml  Output --  Net 815.46 ml     Assessment/Plan:  51 y.o. male is s/p R AKA 2 Days Post-Op   R AKA appears to be healing well Continue unna boot to LLE Would recommend re-consulting Dr. Fredna Dow to evaluate exposed bone of partial amped L index finger Vascular will continue to follow   Dagoberto Ligas, PA-C [facility] [phone] 01/10/2022 7:39 AM   I have independently interviewed  and examined patient and agree with PA assessment and plan above. I have notified Dr. Fredna Dow of the exposed bone in left index finger. Unfortunately if he has persistent left hand issues he would need ligation of his avf. Leg wounds do not appear to be a source of infection.  Dale Ribeiro C. Donzetta Matters, MD [facility] Office: [phone] Pager: [phone]

## 2022-01-10 NOTE — Progress Notes (Signed)
[facility] Progress Note   Subjective:   Was noted to become agitated and had hypotension with fever-  got a couple of boluses-  transferred to the ICU, was already on meropenam and vanc-  Received a unit of blood overnight for hgb of 6.9-  is calm this AM and oriented    Objective Vitals:   01/10/22 0516 01/10/22 0530 01/10/22 0600 01/10/22 0747  BP:  133/83 121/65   Pulse: 75 75 70   Resp: 16 17 (!) 22   Temp:    98.2 F (36.8 C)  TempSrc:    Oral  SpO2: 96% 100% 100%   Weight:      Height:       Physical Exam General:chronically ill appearing male in NAD Heart:RRR, no mrg Lungs:CTAB, nml WOB on RA Abdomen:soft, NTND Extremities:R AKA, L TMA dressing on foot/ankle - trace edema Dialysis Access: left AVF-  patent   Filed Weights   01/08/22 0808 01/08/22 1230 01/08/22 1259  Weight: 96.6 kg 104.3 kg 104.3 kg    Intake/Output Summary (Last 24 hours) at 01/10/2022 0806 Last data filed at 01/10/2022 0545 Gross per 24 hour  Intake 815.46 ml  Output --  Net 815.46 ml    Additional Objective Labs: Basic Metabolic Panel: Recent Labs  Lab 01/08/22 0543 01/08/22 0643 01/08/22 1314 01/09/22 0309 01/10/22 0639  NA  --  132* 133* 134* 135  K  --  5.5* 4.0 4.2 4.0  CL  --  86* 97* 96* 95*  CO2  --  21*  --  24 22  GLUCOSE  --  102* 93 122* 88  BUN  --  55* 18 26* 39*  CREATININE  --  9.74* 4.30* 6.00* 8.17*  CALCIUM  --  9.4  --  8.6* 8.8*  PHOS 9.1*  --   --   --   --    Liver Function Tests: Recent Labs  Lab 01/08/22 0543 01/09/22 0309 01/10/22 0639  AST  --  35 48*  ALT  --  17 15  ALKPHOS  --  70 80  BILITOT  --  1.0 1.5*  PROT  --  6.3* 6.3*  ALBUMIN 2.9* 2.5* 2.5*    CBC: Recent Labs  Lab 01/07/22 1822 01/08/22 0643 01/08/22 1314 01/09/22 0309 01/09/22 2245 01/10/22 0639  WBC 6.6 10.8*  --  12.2* 11.2* 10.5  NEUTROABS  --   --   --  10.3*  --  8.8*  HGB 8.9* 10.5*   < > 7.4* 6.9* 7.5*  HCT 26.6* 32.0*   < > 22.4* 22.0* 22.6*   MCV 97.4 99.1  --  99.6 100.9* 97.8  PLT 283 278  --  225 210 201   < > = values in this interval not displayed.    CBG: Recent Labs  Lab 01/09/22 1205 01/09/22 1505 01/09/22 1608 01/09/22 2114 01/10/22 0056  GLUCAP 98 54* 99 85 82    Studies/Results: DG Chest Port 1 View  Result Date: 01/09/2022 CLINICAL DATA:  Shortness of breath EXAM: PORTABLE CHEST 1 VIEW COMPARISON:  Chest x-ray dated Jan 08, 2022 FINDINGS: Unchanged cardiomegaly. Mild bilateral heterogeneous opacities. No large pleural effusion or pneumothorax. IMPRESSION: 1. Mild bilateral heterogeneous opacities, likely due to pulmonary edema. 2. Unchanged cardiomegaly. Electronically Signed   By: Yetta Glassman M.D.   On: 01/09/2022 13:54    Medications:  sodium chloride     meropenem (MERREM) IV     norepinephrine (LEVOPHED) Adult infusion  vancomycin      sodium chloride   Intravenous Once   acetaminophen  650 mg Oral Once   aspirin EC  81 mg Oral Daily   atorvastatin  80 mg Oral Daily   Chlorhexidine Gluconate Cloth  6 each Topical Q0600   Chlorhexidine Gluconate Cloth  6 each Topical Q0600   clopidogrel  75 mg Oral Daily   darbepoetin (ARANESP) injection - DIALYSIS  100 mcg Intravenous Q Thu-HD   doxercalciferol  11 mcg Intravenous Q T,Th,Sa-HD   ezetimibe  10 mg Oral Daily   heparin  5,000 Units Subcutaneous Q8H   insulin aspart  0-9 Units Subcutaneous TID WC   insulin aspart protamine- aspart  20 Units Subcutaneous BID WC   linaclotide  72 mcg Oral QAC breakfast   pantoprazole  40 mg Oral Daily   polyethylene glycol  17 g Oral Daily   sucroferric oxyhydroxide  1,000 mg Oral TID WC    Dialysis Orders: TTS [facility]  4.5h  425/700  94kg  2/2 bath  LUA AVF   Heparin 8000+ 2062mdrun prn - venofer 100 mg tiw IV , 5/04- 5/25 - mircera 30 ug q4 wks, last 5/4, due early June - hectorol 11 ug tiw IV - last HD 5/20, 93.3 > 92.0 kg - Hb 9.9 on 5/18     Assessment/ Plan: R BKA stump pain/ severe  PAD/Possible cellulits + necrotic fingers- s/p AKA on 01/08/22 by Dr. CDonzetta Matters BC NGTD. Remains on vanc and meropenam.  Had "episode" on 5/24-  confusion/hypotension/fever-  now resolved-  CCM on board ESRD - on HD TTS. HD today per regular schedule via AVF.  Hyperkalemia - resolved.  Severe PAD - sp L TMA, multiple amputations of L hand CAD/ ICM / combined CHF - EF 40-45% DM2 - per pmd BP/ vol - BP's soft to normal. Way over dry weight per bed weights. UF as tolerated.  Will need new dry weight not post AKA. Anemia esrd - Hb drop to 7.4 post surgery-  then 6.9. Hold iron d/t potential infection.  Order ESA with next HD-  got 100.  Transfuse prn for Hgb<7. MBD ckd - Ca in range, phos elevated. Cont velphoro as binder and IV vdra.  Nutrition - renal diet w/fluid restrictions. Alb 2.5 - give protein supplements  [facility] 01/10/2022,8:06 AM  LOS: 3 days

## 2022-01-10 NOTE — Progress Notes (Signed)
Physical Therapy Treatment Patient Details Name: Edward [name] MRN: [id number] DOB: 09/27/1970 Today's Date: 01/10/2022   History of Present Illness 51 y.o. male, history of severe PAD s/p left TM amputation, right BKA with necrotic stump, multiple amputation in left hand fingers, ESRD on TTS schedule for dialysis, DM type 2 insulin-dependent, CAD with MI in past, combined systolic and diastolic CHF EF 10%, orthostatic hypotension of chronic disease, AAA, recent Zio patch placement, who was being evaluated for ongoing right BKA stump pain and necrosis.  S/p transition from R BKA to R AKA 01/08/22.    PT Comments    Patient able to transfer bed to chair today with min to mod A for safety using lateral scooting technique to the L due to painful R LE.  Patient seems appropriate reporting his sons can assist at d/c including bumping w/c up steps.  Patient eager to get new prosthetic, but feel he would benefit initially with HHPT for safety and progress to outpatient PT especially given needing to go out for dialysis.  PT will continue to follow acutely.     Recommendations for follow up therapy are one component of a multi-disciplinary discharge planning process, led by the attending physician.  Recommendations may be updated based on patient status, additional functional criteria and insurance authorization.  Follow Up Recommendations  Home health PT     Assistance Recommended at Discharge Frequent or constant Supervision/Assistance  Patient can return home with the following A little help with walking and/or transfers;Assistance with cooking/housework;Assist for transportation;A lot of help with bathing/dressing/bathroom   Equipment Recommendations  None recommended by PT    Recommendations for Other Services       Precautions / Restrictions Precautions Precautions: Fall Restrictions Weight Bearing Restrictions: Yes RLE Weight Bearing: Non weight bearing Other Position/Activity  Restrictions: multiple amputations of LE and UE.     Mobility  Bed Mobility Overal bed mobility: Needs Assistance Bed Mobility: Supine to Sit     Supine to sit: Mod assist     General bed mobility comments: some lifting help for trunk, pt able to scoot around to square up to EOB    Transfers Overall transfer level: Needs assistance   Transfers: Bed to chair/wheelchair/BSC            Lateral/Scoot Transfers: Min assist, Mod assist General transfer comment: bed elevated due to L LE longer and donned shoe on L foot, assist for safety pt scooting into his w/c from home with armest flipped back.    Ambulation/Gait                   Theme park manager mobility: Yes Wheelchair propulsion: Both upper extremities Wheelchair parts: Needs assistance Distance: 15 Wheelchair Assistance Details (indicate cue type and reason): min A at least to keep moving L LE so pt doesn't roll over top of it  Modified Rankin (Stroke Patients Only)       Balance Overall balance assessment: Needs assistance   Sitting balance-Leahy Scale: Poor Sitting balance - Comments: UE support in sitting, leaning onto L hip to offload R Postural control: Left lateral lean                                  Cognition Arousal/Alertness: Awake/alert Behavior During Therapy: WFL for tasks assessed/performed Overall Cognitive Status: No family/caregiver present  to determine baseline cognitive functioning                                 General Comments: awake and appropriate, some safety concerns with transfers; states has sons to help with getting into house        Exercises Amputee Exercises Hip Flexion/Marching: AROM, Right, 5 reps, Supine    General Comments General comments (skin integrity, edema, etc.): VSS throughout, RN in room initially, Asked NT to apply new linens to bed      Pertinent  Vitals/Pain Pain Assessment Faces Pain Scale: Hurts little more Pain Location: R leg with lifting Pain Descriptors / Indicators: Guarding Pain Intervention(s): Monitored during session, Limited activity within patient's tolerance, Repositioned    Home Living                          Prior Function            PT Goals (current goals can now be found in the care plan section) Progress towards PT goals: Progressing toward goals    Frequency    Min 3X/week      PT Plan Discharge plan needs to be updated    Co-evaluation              AM-PAC PT "6 Clicks" Mobility   Outcome Measure  Help needed turning from your back to your side while in a flat bed without using bedrails?: A Little Help needed moving from lying on your back to sitting on the side of a flat bed without using bedrails?: A Lot Help needed moving to and from a bed to a chair (including a wheelchair)?: A Lot Help needed standing up from a chair using your arms (e.g., wheelchair or bedside chair)?: Total Help needed to walk in hospital room?: Total Help needed climbing 3-5 steps with a railing? : Total 6 Click Score: 10    End of Session Equipment Utilized During Treatment: Gait belt Activity Tolerance: Patient tolerated treatment well Patient left: in chair;with call bell/phone within reach Nurse Communication: Mobility status PT Visit Diagnosis: Muscle weakness (generalized) (M62.81);Unsteadiness on feet (R26.81);Difficulty in walking, not elsewhere classified (R26.2)     Time: 1620-1650 PT Time Calculation (min) (ACUTE ONLY): 30 min  Charges:  $Therapeutic Activity: 23-37 mins                     Magda Kiel, PT Acute Rehabilitation Services [id number]:[phone] Office:[phone] 01/10/2022    Reginia Naas 01/10/2022, 5:54 PM

## 2022-01-10 NOTE — Progress Notes (Signed)
Inpatient Rehab Admissions Coordinator:   Per PT recommendations pt was screened for CIR by Shann Medal, PT, DPT.  Note OT recommending HH and evaluations limited by events of yesterday.  Will likely demonstrate improved tolerance today, as MDs documenting mentation back to baseline, so I will place a rehab consult order, per protocol, and an [facility] will f/u for full assessment.    Shann Medal, PT, DPT Admissions Coordinator [phone] 01/10/22  10:01 AM

## 2022-01-10 NOTE — Progress Notes (Signed)
NAME:  Edward [name], MRN:  [id number], DOB:  12/29/1970, LOS: 3 ADMISSION DATE:  01/07/2022, CONSULTATION DATE:  5/24 REFERRING MD:  Candiss Norse, REASON FOR CONSULT:Hypotension   History of Present Illness:  Patient is encephalopathic. Therefore history has been obtained from chart review.   Edward [name], is a 51 y.o. male, who presented to [facility] on 5/22 for an elective bilateral lower extremity angiogram with Dr. Donzetta Matters.  He was admitted to the hospitalist service post procedure due to requirement of revision of his right lower extremity stump. He underwent a right AKA on 01/08/2022 with Dr. Donzetta Matters.  They have a pertinent past medical history of PAD status post left transmetatarsal amputation, right BKA with necrotic stump, ESRD (TTS), DM2, HLD, CAD with history of MI, combined systolic and diastolic heart failure, AAA, recent Zio patch placement.  PCCM was consulted on 5/24 for hypotension and altered mental status.  Documented blood pressure of 82/60. Tmax 102.4. Prior to arrival he was given 1.25 L IV fluid.  He was given Narcan 0.4x1.  Narcan 0.4 was repeated.  Hemoglobin had dropped by 2 g in the past 24 hours.  Patient on meropenem and vancomycin.  Pertinent  Medical History  PAD status post left transmetatarsal amputation, right BKA with necrotic stump, ESRD (TTS), DM2, HLD, CAD with history of MI, combined systolic and diastolic heart failurem AAA, recent Zio patch placement.  Significant Hospital Events: Including procedures, antibiotic start and stop dates in addition to other pertinent events   5/22 admit 5/23 AKA revision, vanc meropenem  5/24 PCCM consult 5/25 No issues overnight appears back to baseline mentation, Hemodynamics improved as well   Interim History / Subjective:  Alert and interactive this am Report mild stump and back pain   Objective   Blood pressure 121/65, pulse 70, temperature 98.1 F (36.7 C), temperature source Axillary, resp. rate (!) 22,  height '6\' 1"'$  (1.854 m), weight 104.3 kg, SpO2 100 %.        Intake/Output Summary (Last 24 hours) at 01/10/2022 0718 Last data filed at 01/10/2022 0545 Gross per 24 hour  Intake 835.46 ml  Output --  Net 835.46 ml   Filed Weights   01/08/22 1517 01/08/22 1230 01/08/22 1259  Weight: 96.6 kg 104.3 kg 104.3 kg    Examination: General: Acute on chronically ill appearing middle aged male lying in bed, in NAD HEENT: [place]/AT, MM pink/moist, PERRL,  Neuro: Alert and oriented x3, non-focal  CV: s1s2 regular rate and rhythm, no murmur, rubs, or gallops,  PULM:  Clear to ascultation bilaterally, no increased work of breathing, on RA GI: soft, bowel sounds active in all 4 quadrants, non-tender, non-distended, tolerating oral diet  Extremities: warm/dry, no edema, right AKA, multiple digit amputations  Skin: no rashes or lesions  Resolved Hospital Problem list     Assessment & Plan:  Hypotension - resolved  -Suspect multifactorial in the setting of sepsis, acute blood loss post surgery, ESRD. Procalcitonin 1.16 > 8.75. WBC 10.8 > 12.2. Tmax 102.4. Severe sepsis  -Secondary to right stump infection P: Sepsis physiology resolved by morning of 5/25 Continue IV Meropenem and Vancomycin x7 days  Remains in ICU until HD session to ensure hemodynamics remains stable    Acute metabolic encephalopathy - resolved  -Suspect secondary to sepsis with component of chronic kidney disease.  0.8 Narcan given with no change. LFT WNL. 100% SPO2 on room air P: Maintain neuro protective measures Nutrition and bowel regiment  Seizure precautions  Aspirations precautions  Resume home tramadol, and Gabapentin, schedule tylenol  Right BKA, POD 1 HX PAD HX right transmetatarsal amputation Multiple amputation of LUE fingers -With Dr. Gwenlyn Saran.  Revision of stump P: Management per Vascular surgery   ESRD on HD (TTS) P: Nephrology following, appreciate assistance  iHD today Follow renal function  Monitor  urine output Trend Bmet Avoid nephrotoxins, ensure adequate renal perfusion   Acute blood loss anemia on anemia of chronic disease -Hemoglobin 10.5 > 9.5 > 7.4 > 6.8 P: Received 1 unit PRBC overnight 5/24 for hgb drop to 6.8 Trend CBC  Monitor for signs of bleeding  Hgb goal > 7  CAD with ischemic cardiomyopathy Combined systolic and diastolic CHF HLD P: Continuous telemetry  Strict intake and output  Daily weight to assess volume status Volume removal per iHD Supplemental oxygen as needed for sat goal 90%ad  Continue DAPT and statin   DM2 P: Stop SSI as it has not been needed  Reduce home 70/30 to 15 units Continue CBG checks q4   PCCM will sign off. Thank you for the opportunity to participate in this patient's care. Please contact if we can be of further assistance.   Best Practice (right click and "Reselect all SmartList Selections" daily)   Diet/type: NPO DVT prophylaxis: prophylactic heparin  GI prophylaxis: PPI Lines: N/A Foley:  N/A Code Status:  full code Last date of multidisciplinary goals of care discussion [pending]  Critical care time:    Kenae Lindquist D. Kenton Kingfisher, NP-C [facility] Personal contact information can be found on Amion  01/10/2022, 7:21 AM

## 2022-01-11 DIAGNOSIS — I739 Peripheral vascular disease, unspecified: Secondary | ICD-10-CM | POA: Diagnosis not present

## 2022-01-11 LAB — CBC WITH DIFFERENTIAL/PLATELET
Abs Immature Granulocytes: 0.06 10*3/uL (ref 0.00–0.07)
Basophils Absolute: 0 10*3/uL (ref 0.0–0.1)
Basophils Relative: 0 %
Eosinophils Absolute: 0.3 10*3/uL (ref 0.0–0.5)
Eosinophils Relative: 3 %
HCT: 22.4 % — ABNORMAL LOW (ref 39.0–52.0)
Hemoglobin: 7.9 g/dL — ABNORMAL LOW (ref 13.0–17.0)
Immature Granulocytes: 1 %
Lymphocytes Relative: 8 %
Lymphs Abs: 0.8 10*3/uL (ref 0.7–4.0)
MCH: 33.6 pg (ref 26.0–34.0)
MCHC: 35.3 g/dL (ref 30.0–36.0)
MCV: 95.3 fL (ref 80.0–100.0)
Monocytes Absolute: 0.6 10*3/uL (ref 0.1–1.0)
Monocytes Relative: 6 %
Neutro Abs: 8 10*3/uL — ABNORMAL HIGH (ref 1.7–7.7)
Neutrophils Relative %: 82 %
Platelets: 241 10*3/uL (ref 150–400)
RBC: 2.35 MIL/uL — ABNORMAL LOW (ref 4.22–5.81)
RDW: 16.8 % — ABNORMAL HIGH (ref 11.5–15.5)
WBC: 9.9 10*3/uL (ref 4.0–10.5)
nRBC: 0 % (ref 0.0–0.2)

## 2022-01-11 LAB — BPAM RBC
Blood Product Expiration Date: 202306102359
ISSUE DATE / TIME: 202305250153
Unit Type and Rh: 7300

## 2022-01-11 LAB — GLUCOSE, CAPILLARY
Glucose-Capillary: 114 mg/dL — ABNORMAL HIGH (ref 70–99)
Glucose-Capillary: 46 mg/dL — ABNORMAL LOW (ref 70–99)
Glucose-Capillary: 51 mg/dL — ABNORMAL LOW (ref 70–99)
Glucose-Capillary: 74 mg/dL (ref 70–99)
Glucose-Capillary: 119 mg/dL — ABNORMAL HIGH (ref 70–99)
Glucose-Capillary: 143 mg/dL — ABNORMAL HIGH (ref 70–99)
Glucose-Capillary: 78 mg/dL (ref 70–99)

## 2022-01-11 LAB — COMPREHENSIVE METABOLIC PANEL
ALT: 12 U/L (ref 0–44)
AST: 34 U/L (ref 15–41)
Albumin: 2.5 g/dL — ABNORMAL LOW (ref 3.5–5.0)
Alkaline Phosphatase: 108 U/L (ref 38–126)
Anion gap: 12 (ref 5–15)
BUN: 14 mg/dL (ref 6–20)
CO2: 27 mmol/L (ref 22–32)
Calcium: 8.6 mg/dL — ABNORMAL LOW (ref 8.9–10.3)
Chloride: 97 mmol/L — ABNORMAL LOW (ref 98–111)
Creatinine, Ser: 3.31 mg/dL — ABNORMAL HIGH (ref 0.61–1.24)
GFR, Estimated: 22 mL/min — ABNORMAL LOW (ref >60)
Glucose, Bld: 94 mg/dL (ref 70–99)
Potassium: 2.9 mmol/L — ABNORMAL LOW (ref 3.5–5.1)
Sodium: 136 mmol/L (ref 135–145)
Total Bilirubin: 0.7 mg/dL (ref 0.3–1.2)
Total Protein: 6.8 g/dL (ref 6.5–8.1)

## 2022-01-11 LAB — CBC
HCT: 26.2 % — ABNORMAL LOW (ref 39.0–52.0)
Hemoglobin: 8.7 g/dL — ABNORMAL LOW (ref 13.0–17.0)
MCH: 32.5 pg (ref 26.0–34.0)
MCHC: 33.2 g/dL (ref 30.0–36.0)
MCV: 97.8 fL (ref 80.0–100.0)
Platelets: 281 10*3/uL (ref 150–400)
RBC: 2.68 MIL/uL — ABNORMAL LOW (ref 4.22–5.81)
RDW: 16.7 % — ABNORMAL HIGH (ref 11.5–15.5)
WBC: 9 10*3/uL (ref 4.0–10.5)
nRBC: 0 % (ref 0.0–0.2)

## 2022-01-11 LAB — RENAL FUNCTION PANEL
Albumin: 2.7 g/dL — ABNORMAL LOW (ref 3.5–5.0)
Anion gap: 15 (ref 5–15)
BUN: 23 mg/dL — ABNORMAL HIGH (ref 6–20)
CO2: 25 mmol/L (ref 22–32)
Calcium: 9.1 mg/dL (ref 8.9–10.3)
Chloride: 95 mmol/L — ABNORMAL LOW (ref 98–111)
Creatinine, Ser: 5.19 mg/dL — ABNORMAL HIGH (ref 0.61–1.24)
GFR, Estimated: 13 mL/min — ABNORMAL LOW (ref >60)
Glucose, Bld: 135 mg/dL — ABNORMAL HIGH (ref 70–99)
Phosphorus: 3.2 mg/dL (ref 2.5–4.6)
Potassium: 3.9 mmol/L (ref 3.5–5.1)
Sodium: 135 mmol/L (ref 135–145)

## 2022-01-11 LAB — TYPE AND SCREEN
ABO/RH(D): B POS
Antibody Screen: NEGATIVE
Unit division: 0

## 2022-01-11 LAB — MAGNESIUM: Magnesium: 2.1 mg/dL (ref 1.7–2.4)

## 2022-01-11 LAB — C-REACTIVE PROTEIN: CRP: 27.8 mg/dL — ABNORMAL HIGH (ref <1.0)

## 2022-01-11 LAB — VANCOMYCIN, RANDOM: Vancomycin Rm: 33

## 2022-01-11 LAB — PROCALCITONIN: Procalcitonin: 15.25 ng/mL

## 2022-01-11 MED ORDER — SODIUM CHLORIDE 0.9 % IV SOLN: 100.0000 mL | INTRAVENOUS | Status: DC | PRN

## 2022-01-11 MED ORDER — HEPARIN SODIUM (PORCINE) 1000 UNIT/ML DIALYSIS
20.0000 [IU]/kg | INTRAMUSCULAR | Status: DC | PRN
  Filled 2022-01-11: qty 2

## 2022-01-11 MED ORDER — ALTEPLASE 2 MG IJ SOLR
2.0000 mg | Freq: Once | INTRAMUSCULAR | Status: DC | PRN
  Filled 2022-01-11: qty 2

## 2022-01-11 MED ORDER — POVIDONE-IODINE 10 % EX SWAB: 2.0000 "application " | Freq: Once | CUTANEOUS | Status: DC

## 2022-01-11 MED ORDER — PENTAFLUOROPROP-TETRAFLUOROETH EX AERO
1.0000 "application " | INHALATION_SPRAY | CUTANEOUS | Status: DC | PRN
  Filled 2022-01-11: qty 116

## 2022-01-11 MED ORDER — HEPARIN SODIUM (PORCINE) 1000 UNIT/ML DIALYSIS
1000.0000 [IU] | INTRAMUSCULAR | Status: DC | PRN
  Filled 2022-01-11 (×2): qty 1

## 2022-01-11 MED ORDER — INSULIN ASPART PROT & ASPART (70-30 MIX) 100 UNIT/ML ~~LOC~~ SUSP
10.0000 [IU] | Freq: Two times a day (BID) | SUBCUTANEOUS | Status: DC
  Administered 2022-01-11 – 2022-01-13 (×2): 10 [IU] via SUBCUTANEOUS
  Filled 2022-01-11: qty 10

## 2022-01-11 MED ORDER — CHLORHEXIDINE GLUCONATE CLOTH 2 % EX PADS
6.0000 | MEDICATED_PAD | Freq: Every day | CUTANEOUS | Status: DC
  Administered 2022-01-11: 6 via TOPICAL

## 2022-01-11 MED ORDER — POTASSIUM CHLORIDE CRYS ER 20 MEQ PO TBCR
30.0000 meq | EXTENDED_RELEASE_TABLET | Freq: Once | ORAL | Status: AC
  Administered 2022-01-11: 30 meq via ORAL
  Filled 2022-01-11: qty 1

## 2022-01-11 MED ORDER — LIDOCAINE-PRILOCAINE 2.5-2.5 % EX CREA
1.0000 "application " | TOPICAL_CREAM | CUTANEOUS | Status: DC | PRN
  Filled 2022-01-11: qty 5

## 2022-01-11 MED ORDER — CHLORHEXIDINE GLUCONATE 4 % EX LIQD
60.0000 mL | Freq: Once | CUTANEOUS | Status: AC
  Administered 2022-01-11: 4 via TOPICAL
  Filled 2022-01-11: qty 60

## 2022-01-11 MED ORDER — LIDOCAINE HCL (PF) 1 % IJ SOLN: 5.0000 mL | INTRAMUSCULAR | Status: DC | PRN

## 2022-01-11 NOTE — Progress Notes (Addendum)
  Progress Note    01/11/2022 10:55 AM 3 Days Post-Op  Subjective:  no complaints   Vitals:   01/11/22 0700 01/11/22 0900  BP: 96/63 110/64  Pulse: 86 81  Resp: 17 (!) 21  Temp:    SpO2: 90% 100%   Physical Exam: Cardiac:  regular Lungs:  non labored Incisions:  Right AKA c/d/I. Dry dressings just applied Extremities:  LLE well perfused, unna book to left lower leg Neurologic: alert and oriented  CBC    Component Value Date/Time   WBC 9.9 01/11/2022 0239   RBC 2.35 (L) 01/11/2022 0239   HGB 7.9 (L) 01/11/2022 0239   HCT 22.4 (L) 01/11/2022 0239   PLT 241 01/11/2022 0239   MCV 95.3 01/11/2022 0239   MCH 33.6 01/11/2022 0239   MCHC 35.3 01/11/2022 0239   RDW 16.8 (H) 01/11/2022 0239   LYMPHSABS 0.8 01/11/2022 0239   MONOABS 0.6 01/11/2022 0239   EOSABS 0.3 01/11/2022 0239   BASOSABS 0.0 01/11/2022 0239    BMET    Component Value Date/Time   NA 136 01/11/2022 0239   NA 140 04/30/2019 1507   K 2.9 (L) 01/11/2022 0239   CL 97 (L) 01/11/2022 0239   CO2 27 01/11/2022 0239   GLUCOSE 94 01/11/2022 0239   BUN 14 01/11/2022 0239   BUN 37 (H) 04/30/2019 1507   CREATININE 3.31 (H) 01/11/2022 0239   CREATININE 1.70 (H) 06/15/2018 1458   CALCIUM 8.6 (L) 01/11/2022 0239   GFRNONAA 22 (L) 01/11/2022 0239   GFRAA 22 (L) 11/26/2019 1151    INR    Component Value Date/Time   INR 1.2 09/28/2020 0340     Intake/Output Summary (Last 24 hours) at 01/11/2022 1055 Last data filed at 01/11/2022 0800 Gross per 24 hour  Intake 890 ml  Output 3000 ml  Net -2110 ml     Assessment/Plan:  51 y.o. male is s/p R AKA 3 Days Post-Op   Right AKA well appearing, c/d/I Pain well controlled Continue Unna boot/ Compression to LLE Dr. Fredna Dow to follow L index finger Will arrange outpatient follow up in 1 month for AKA staple removal  Karoline Caldwell, PA-C [facility] [phone] 01/11/2022 10:55 AM  I have independently interviewed and examined patient  and agree with PA assessment and plan above. No overt signs of infection and he is clinically much improved.   Edward [name] C. Donzetta Matters, MD [facility] [place] Office: [phone] Pager: [phone]

## 2022-01-11 NOTE — Progress Notes (Signed)
Pharmacy Antibiotic Note  Edward [name] is a 51 y.o. male admitted on 01/07/2022 with R BKA stump site discomfort. S/p R AKA 5/23 but pt with elevation in CRP and low-grade fevers. ?low grade cellulitis around amputation site. Pharmacy has been consulted for Vancomycin and meropenem  Pt continues on HD - T/T/S (Next HD 5/27). Of note, updated weight is down 20 kg from previous weights.   5/26 VR 33: no dose adjustment required  Plan: Continue Meropenem 500 mg IV Q24H  Continue vancomycin '1000mg'$  IV qHD  F/U HD schedule, tolerability    Height: '6\' 1"'$  (185.4 cm) Weight: 88.3 kg (194 lb 10.7 oz) IBW/kg (Calculated) : 79.9  Temp (24hrs), Avg:98.3 F (36.8 C), Min:98.1 F (36.7 C), Max:98.6 F (37 C)  Recent Labs  Lab 01/08/22 0643 01/08/22 1314 01/09/22 0309 01/09/22 1803 01/09/22 2245 01/10/22 0639 01/10/22 1201 01/11/22 0239  WBC 10.8*  --  12.2*  --  11.2* 10.5 11.8* 9.9  CREATININE 9.74* 4.30* 6.00*  --   --  8.17*  --  3.31*  LATICACIDVEN  --   --   --  5.8* 1.6  --   --   --      Estimated Creatinine Clearance: 29.5 mL/min (A) (by C-G formula based on SCr of 3.31 mg/dL (H)).    Allergies  Allergen Reactions   Crestor [Rosuvastatin] Rash    Also had a red eye   Dilaudid [Hydromorphone] Itching   Benadryl [Diphenhydramine] Rash    Antimicrobials this admission:  Zosyn 5/24>> 5/24 Meropenem 5/24>>(5/31) Vanc 5/24>> (5/30)   Microbiology results: 5/23 BCx: NGTD  5/23 MRSA PCR: negative  Adria Dill, PharmD PGY-1 Acute Care Resident  01/11/2022 11:15 AM

## 2022-01-11 NOTE — Progress Notes (Signed)
OT Cancellation Note  Patient Details Name: Edward [name] MRN: [id number] DOB: Dec 09, 1970   Cancelled Treatment:    Reason Eval/Treat Not Completed: Other (comment).  Still on BSC attempting BM.  Will continue efforts.    Laisa Larrick D Tiffani Kadow 01/11/2022, 2:01 PM

## 2022-01-11 NOTE — Progress Notes (Signed)
Physical Therapy Treatment Patient Details Name: Edward [name] MRN: [id number] DOB: 04-14-1971 Today's Date: 01/11/2022   History of Present Illness Pt is a 51 y.o. 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 S/p transition of R BKA to R AKA on 5/23. Course complicated by hypotension, acute metabolic encephalopathy. PMH includes PAD, multiple finger amputations, ESRD (HD TTS), DM2, CAD, CHF, AAA, orthostatic hypotension.   PT Comments    Pt progressing with mobility. Today's session focused on transfer training from [facility] and bed to/from pt's wheelchair. Pt's son present to observe session, also able to demonstrate providing pt with necessary assist to complete squat pivot transfer. Pt motivated to participate and return home. If to remain admitted, will continue to follow acutely.    Recommendations for follow up therapy are one component of a multi-disciplinary discharge planning process, led by the attending physician.  Recommendations may be updated based on patient status, additional functional criteria and insurance authorization.  Follow Up Recommendations  Home health PT     Assistance Recommended at Discharge Intermittent Supervision/Assistance  Patient can return home with the following A lot of help with walking and/or transfers;A lot of help with bathing/dressing/bathroom;Assistance with cooking/housework;Assist for transportation;Help with stairs or ramp for entrance   Equipment Recommendations  None recommended by PT    Recommendations for Other Services       Precautions / Restrictions Precautions Precautions: Fall Restrictions Weight Bearing Restrictions: Yes RLE Weight Bearing: Non weight bearing     Mobility  Bed Mobility               General bed mobility comments: received sitting on BSC    Transfers Overall transfer level: Needs assistance   Transfers: Bed to  chair/wheelchair/BSC       Squat pivot transfers: Mod assist, +2 safety/equipment     General transfer comment: multiple partial sit<>stands from [facility] for pericare, pt able to perform with minA for trunk elevation and stability, keeping BUE support on armrests; additional squat pivot from [facility] to w/c with modA (+2 safety for pt comfort), educ pt on set-up and transferring towards stronger L-side; additional squat pivot transfer from w/c to bed, pt's two sons present to assist with transfer, educ on recommended technique, sons able to perform    Ambulation/Gait                   Theme park manager mobility: Yes Wheelchair propulsion: Left lower extremity Wheelchair parts: Needs assistance Distance: 390 Wheelchair Assistance Details (indicate cue type and reason): pt able to maneuver w/c ~12' in room and move it into position for transfer with L foot, does not like to use BUEs to push due to finger amputations/numbness; pt required assistance to propel w/c in hallway  Modified Rankin (Stroke Patients Only)       Balance Overall balance assessment: Needs assistance Sitting-balance support: No upper extremity supported Sitting balance-Leahy Scale: Fair       Standing balance-Leahy Scale: Poor Standing balance comment: heavy reliance on BUE support to maintain partial standing at [facility], dependent for posterior pericare                            Cognition Arousal/Alertness: Awake/alert Behavior During Therapy: WFL for tasks assessed/performed Overall Cognitive Status: Within Functional Limits for  tasks assessed                                 General Comments: WFL for simple tasks; requires cues for safety with transfers        Exercises      General Comments General comments (skin integrity, edema, etc.): pt's sons present and supportive; asking appropriate questions about transfers  and pt's mobility at home; pt's sons able to demonstrate safe pivot transfer from w/c to bed; demonstrated recommended technique for ascending/descending step into home, pt and sons report understanding - all seem on board with pt's return home and providing necessary assist      Pertinent Vitals/Pain Pain Assessment Pain Assessment: Faces Faces Pain Scale: Hurts a little bit Pain Location: R residual limb Pain Descriptors / Indicators: Sore Pain Intervention(s): Monitored during session, Limited activity within patient's tolerance    Home Living                          Prior Function            PT Goals (current goals can now be found in the care plan section) Acute Rehab PT Goals Patient Stated Goal: return home with assist from sons PT Goal Formulation: With patient/family Progress towards PT goals: Progressing toward goals    Frequency    Min 3X/week      PT Plan Current plan remains appropriate    Co-evaluation              AM-PAC PT "6 Clicks" Mobility   Outcome Measure  Help needed turning from your back to your side while in a flat bed without using bedrails?: A Little Help needed moving from lying on your back to sitting on the side of a flat bed without using bedrails?: A Little Help needed moving to and from a bed to a chair (including a wheelchair)?: A Lot Help needed standing up from a chair using your arms (e.g., wheelchair or bedside chair)?: Total Help needed to walk in hospital room?: Total Help needed climbing 3-5 steps with a railing? : Total 6 Click Score: 11    End of Session Equipment Utilized During Treatment: Gait belt Activity Tolerance: Patient tolerated treatment well Patient left: in bed;with call bell/phone within reach;with family/visitor present (sitting EOB) Nurse Communication: Mobility status PT Visit Diagnosis: Muscle weakness (generalized) (M62.81);Unsteadiness on feet (R26.81);Difficulty in walking, not  elsewhere classified (R26.2)     Time: 7425-9563 PT Time Calculation (min) (ACUTE ONLY): 24 min  Charges:  $Therapeutic Activity: 23-37 mins                     Mabeline Caras, PT, DPT Acute Rehabilitation Services  Pager [phone] Office [place] 01/11/2022, 3:17 PM

## 2022-01-11 NOTE — Consult Note (Signed)
Edward [name] is an 51 y.o. male.   Consult: finger necrosis, wound breakdown HPI: 51 yo male on hemodialysis.  Has have amputation left index and ring finger with breakdown of left index finger wound.  Proximal phalanx head exposed in wound.  Also with dry gangrene of right index and long fingers and in past couple of days small finger has turned black.    Allergies:  Allergies  Allergen Reactions   Crestor [Rosuvastatin] Rash    Also had a red eye   Dilaudid [Hydromorphone] Itching   Benadryl [Diphenhydramine] Rash    Past Medical History:  Diagnosis Date   Allergy    Anemia    getting Venofer with dialysis   Anxiety    self reported, sometimes-situational anxiety   Arthritis    bilateral hands/LEFT hip   CHF (congestive heart failure) (HCC)    Chronic kidney disease    t th s dialysis- [place] ---ESRD   Complication of anesthesia    slow to go to sleep   Coronary artery disease    Depression    self reported, sometimes-situational depression   Diabetes mellitus    type II- on meds   Dyspnea    sometimes- fluid   GERD (gastroesophageal reflux disease)    on meds   Hypertension    no longer on medication since starting on Hemodialysis   Myocardial infarction [facility]) 2019   Sleep apnea     Past Surgical History:  Procedure Laterality Date   A/V FISTULAGRAM Left 03/06/2020   Procedure: A/V FISTULAGRAM;  Surgeon: Waynetta Sandy, MD;  Location: [facility];  Service: Cardiovascular;  Laterality: Left;   ABDOMINAL AORTOGRAM N/A 01/07/2022   Procedure: ABDOMINAL AORTOGRAM;  Surgeon: Waynetta Sandy, MD;  Location: [facility];  Service: Cardiovascular;  Laterality: N/A;   ABDOMINAL AORTOGRAM W/LOWER EXTREMITY N/A 04/09/2021   Procedure: ABDOMINAL AORTOGRAM W/LOWER EXTREMITY;  Surgeon: Waynetta Sandy, MD;  Location: [facility];  Service: Cardiovascular;  Laterality: N/A;   AMPUTATION Left 05/10/2016   Procedure: Left  Transmetatarsal Amputation;  Surgeon: [name], MD;  Location: [facility];  Service: Orthopedics;  Laterality: Left;   AMPUTATION Right 05/16/2018   Procedure: PARTIAL RAY AMPUTATION FIFTH TOE RIGHT FOOT;  Surgeon: [name], DPM;  Location: [facility];  Service: Podiatry;  Laterality: Right;   AMPUTATION Right 06/17/2018   Procedure: AMPUTATION 4th RAY Right Foot;  Surgeon: Trula Slade, DPM;  Location: [facility];  Service: Podiatry;  Laterality: Right;   AMPUTATION Right 06/19/2018   Procedure: RIGHT BELOW KNEE AMPUTATION;  Surgeon: [name], MD;  Location: [facility];  Service: Orthopedics;  Laterality: Right;   AMPUTATION Left 10/05/2021   Procedure: LEFT RING FINGER AMPUTATION;  Surgeon: Daryll Brod, MD;  Location: [place] [facility];  Service: Orthopedics;  Laterality: Left;  45 MIN   AMPUTATION Left 11/15/2021   Procedure: LEFT INDEX FINGER AMPUTATION;  Surgeon: Leanora Cover, MD;  Location: [facility];  Service: Orthopedics;  Laterality: Left;  60 MIN   AMPUTATION Right 01/08/2022   Procedure: AMPUTATION ABOVE KNEE;  Surgeon: Waynetta Sandy, MD;  Location: [facility];  Service: Vascular;  Laterality: Right;   AORTIC ARCH ANGIOGRAPHY N/A 01/07/2022   Procedure: AORTIC ARCH ANGIOGRAPHY;  Surgeon: Waynetta Sandy, MD;  Location: [facility];  Service: Cardiovascular;  Laterality: N/A;   APPLICATION OF WOUND VAC Right 06/19/2018   Procedure: APPLICATION OF WOUND VAC;  Surgeon: Meridee Score  V, MD;  Location: [facility];  Service: Orthopedics;  Laterality: Right;   AV FISTULA PLACEMENT Left 11/25/2019   Procedure: LEFT ARM Brachiocephalic  ARTERIOVENOUS (AV) FISTULA CREATION;  Surgeon: Rosetta Posner, MD;  Location: [facility];  Service: Vascular;  Laterality: Left;   [place] Left 07/19/2020   Procedure: LEFT SECOND STAGE [place];  Surgeon: Waynetta Sandy, MD;  Location: [facility];  Service: Vascular;  Laterality: Left;   CARDIAC CATHETERIZATION  07/29/2018    CIRCUMCISION  09/02/2011   Procedure: CIRCUMCISION ADULT;  Surgeon: Molli Hazard, MD;  Location: [facility];  Service: Urology;  Laterality: N/A;   CORONARY STENT INTERVENTION N/A 07/29/2018   Procedure: CORONARY STENT INTERVENTION;  Surgeon: Leonie Man, MD;  Location: [facility];  Service: Cardiovascular;  Laterality: N/A;   DEBRIDEMENT AND CLOSURE WOUND Left 11/15/2021   Procedure: LEFT HAND WOUND CLOSURE;  Surgeon: Leanora Cover, MD;  Location: [facility];  Service: Orthopedics;  Laterality: Left;   I & D EXTREMITY  09/02/2011   Procedure: IRRIGATION AND DEBRIDEMENT EXTREMITY;  Surgeon: Theodoro Kos, DO;  Location: [facility];  Service: Plastics;  Laterality: N/A;   INCISION AND DRAINAGE OF WOUND  08/12/2011   Procedure: IRRIGATION AND DEBRIDEMENT WOUND;  Surgeon: Zenovia Jarred, MD;  Location: [facility];  Service: General;;  perineum   INSERTION OF DIALYSIS CATHETER N/A 11/25/2019   Procedure: INSERTION OF [place] Internal Jugular DIALYSIS CATHETER;  Surgeon: Rosetta Posner, MD;  Location: [facility];  Service: Vascular;  Laterality: N/A;   [place]  08/12/2011   Procedure: IRRIGATION AND DEBRIDEMENT ABSCESS;  Surgeon: Molli Hazard, MD;  Location: [facility];  Service: Urology;  Laterality: N/A;  irrigation and debridment perineum     IRRIGATION AND DEBRIDEMENT ABSCESS  08/14/2011   Procedure: MINOR INCISION AND DRAINAGE OF ABSCESS;  Surgeon: Molli Hazard, MD;  Location: [facility];  Service: Urology;  Laterality: N/A;  Perineal Wound Debridement;Placement Bilateral Testicular Thigh Pouches   LEFT HEART CATH AND CORONARY ANGIOGRAPHY N/A 07/29/2018   Procedure: LEFT HEART CATH AND CORONARY ANGIOGRAPHY;  Surgeon: Leonie Man, MD;  Location: [facility];  Service: Cardiovascular;  Laterality: N/A;   LOWER EXTREMITY ANGIOGRAPHY Bilateral 01/07/2022   Procedure: Lower Extremity Angiography;  Surgeon: Waynetta Sandy, MD;  Location: [facility];  Service: Cardiovascular;  Laterality: Bilateral;   PERIPHERAL VASCULAR ATHERECTOMY Left 04/09/2021   Procedure: PERIPHERAL VASCULAR ATHERECTOMY;  Surgeon: Waynetta Sandy, MD;  Location: [facility];  Service: Cardiovascular;  Laterality: Left;  TP trunk and peroneal arteries   PERIPHERAL VASCULAR BALLOON ANGIOPLASTY Left 04/09/2021   Procedure: PERIPHERAL VASCULAR BALLOON ANGIOPLASTY;  Surgeon: Waynetta Sandy, MD;  Location: [facility];  Service: Cardiovascular;  Laterality: Left;  Popliteal   REVISON OF ARTERIOVENOUS FISTULA Left 05/24/2020   Procedure: LEFT ARM ARTERIOVENOUS FISTULA  CONVERSION TO BASILIC VEIN FISTULA;  Surgeon: Waynetta Sandy, MD;  Location: [facility];  Service: Vascular;  Laterality: Left;   TOTAL HIP ARTHROPLASTY Left 10/11/2016   Procedure: LEFT TOTAL HIP ARTHROPLASTY ANTERIOR APPROACH;  Surgeon: Mcarthur Rossetti, MD;  Location[facility];  Service: Orthopedics;  Laterality: Left;   UPPER EXTREMITY ANGIOGRAPHY Bilateral 01/07/2022   Procedure: Upper Extremity Angiography;  Surgeon: Waynetta Sandy, MD;  Location: [facility];  Service: Cardiovascular;  Laterality: Bilateral;    Family History: Family History  Problem  Relation Age of Onset   Diabetes Other    Pancreatic cancer Mother 50   Colon cancer Neg Hx    Colon polyps Neg Hx    Esophageal cancer Neg Hx    Rectal cancer Neg Hx    Stomach cancer Neg Hx     Social History:   reports that he quit smoking about 2 years ago. His smoking use included cigarettes. He has a 6.00 pack-year smoking history. He has never used smokeless tobacco. He reports that he does not currently use alcohol. He reports that he does not use drugs.  Medications: Medications Prior to Admission  Medication Sig Dispense Refill   acetaminophen (TYLENOL) 500 MG tablet Take 500-1,000 mg by mouth every 6 (six) hours as needed for  moderate pain or headache.     aspirin EC 81 MG tablet Take 1 tablet (81 mg total) by mouth daily. Swallow whole. 90 tablet 3   atorvastatin (LIPITOR) 80 MG tablet Take 80 mg by mouth daily.     clopidogrel (PLAVIX) 75 MG tablet Take 1 tablet (75 mg total) by mouth daily. 30 tablet 11   gabapentin (NEURONTIN) 100 MG capsule Take 100 mg by mouth 3 (three) times daily.     insulin aspart protamine - aspart (NOVOLOG MIX 70/30 FLEXPEN) (70-30) 100 UNIT/ML FlexPen Inject 0.3 mLs (30 Units total) into the skin 2 (two) times daily with a meal. 15 mL 0   lidocaine-prilocaine (EMLA) cream Apply 1 application topically See admin instructions. Apply a small amount to access site (AVF) 1-2 hours before dialysis. Cover with occlusive dressing (saran wrap). Tuesday,Thursday,saturday     pantoprazole (PROTONIX) 40 MG tablet Take 1 tablet (40 mg total) by mouth in the morning and at bedtime. (Patient taking differently: Take 40 mg by mouth daily.) 60 tablet 3   traMADol (ULTRAM) 50 MG tablet Take 50 mg by mouth 2 (two) times daily as needed.     VELPHORO 500 MG chewable tablet Chew 1,000 mg by mouth 3 (three) times daily with meals.     bismuth subsalicylate (PEPTO BISMOL) 262 MG/15ML suspension Take 30 mLs by mouth every 6 (six) hours as needed for indigestion.     blood glucose meter kit and supplies Dispense based on patient and insurance preference. Use up to four times daily as directed. (FOR ICD-9 250.00, 250.01). 1 each 0   Insulin Pen Needle 31G X 5 MM MISC Use daily to inject insulin as instructed 100 each 2   linaclotide (LINZESS) 72 MCG capsule Take 1 capsule (72 mcg total) by mouth daily before breakfast. (Patient taking differently: Take 72 mcg by mouth daily as needed (constipation).) 8 capsule 0   triamcinolone ointment (KENALOG) 0.1 % 1 application. daily as needed (rash).      Results for orders placed or performed during the hospital encounter of 01/07/22 (from the past 48 hour(s))  Glucose,  capillary     Status: None   Collection Time: 01/09/22  4:08 PM  Result Value Ref Range   Glucose-Capillary 99 70 - 99 mg/dL    Comment: Glucose reference range applies only to samples taken after fasting for at least 8 hours.  MRSA Next Gen by PCR, Nasal     Status: None   Collection Time: 01/09/22  6:03 PM   Specimen: Nasal Mucosa; Nasal Swab  Result Value Ref Range   MRSA by PCR Next Gen NOT DETECTED NOT DETECTED    Comment: (NOTE) The GeneXpert MRSA Assay (FDA approved for NASAL  specimens only), is one component of a comprehensive MRSA colonization surveillance program. It is not intended to diagnose MRSA infection nor to guide or monitor treatment for MRSA infections. Test performance is not FDA approved in patients less than 38 years old. Performed at [facility], [place] [address], [place], [place] [address]   Lactic acid, plasma     Status: Abnormal   Collection Time: 01/09/22  6:03 PM  Result Value Ref Range   Lactic Acid, Venous 5.8 (HH) 0.5 - 1.9 mmol/L    Comment: CRITICAL RESULT CALLED TO, READ BACK BY AND VERIFIED WITH: Johna Sheriff, RN ON 01/09/22 AT 1855 BY [facility] D Performed at [facility], [place] [address] [place], [place] [address]   Glucose, capillary     Status: None   Collection Time: 01/09/22  9:14 PM  Result Value Ref Range   Glucose-Capillary 85 70 - 99 mg/dL    Comment: Glucose reference range applies only to samples taken after fasting for at least 8 hours.  CBC     Status: Abnormal   Collection Time: 01/09/22 10:45 PM  Result Value Ref Range   WBC 11.2 (H) 4.0 - 10.5 K/uL   RBC 2.18 (L) 4.22 - 5.81 MIL/uL   Hemoglobin 6.9 (LL) 13.0 - 17.0 g/dL    Comment: REPEATED TO VERIFY THIS CRITICAL RESULT HAS VERIFIED AND BEEN CALLED TO W COUNCIL RN BY CANDACE HAYES ON 05 24 2023 AT 2324, AND HAS BEEN READ BACK.     HCT 22.0 (L) 39.0 - 52.0 %   MCV 100.9 (H) 80.0 - 100.0 fL   MCH 31.7 26.0 - 34.0 pg   MCHC 31.4 30.0 - 36.0 g/dL   RDW 15.9 (H) 11.5 -  15.5 %   Platelets 210 150 - 400 K/uL   nRBC 0.0 0.0 - 0.2 %    Comment: Performed at [facility] [address], [place], [place] [address]  Lactic acid, plasma     Status: None   Collection Time: 01/09/22 10:45 PM  Result Value Ref Range   Lactic Acid, Venous 1.6 0.5 - 1.9 mmol/L    Comment: Performed at [facility] [address], [place], [place] [address]  Prepare RBC (crossmatch)     Status: None   Collection Time: 01/10/22 12:21 AM  Result Value Ref Range   Order Confirmation      ORDER PROCESSED BY BLOOD BANK Performed at [facility], [place] [address] [place], [place] [address]   Glucose, capillary     Status: None   Collection Time: 01/10/22 12:56 AM  Result Value Ref Range   Glucose-Capillary 82 70 - 99 mg/dL    Comment: Glucose reference range applies only to samples taken after fasting for at least 8 hours.  CBC with Differential/Platelet     Status: Abnormal   Collection Time: 01/10/22  6:39 AM  Result Value Ref Range   WBC 10.5 4.0 - 10.5 K/uL   RBC 2.31 (L) 4.22 - 5.81 MIL/uL   Hemoglobin 7.5 (L) 13.0 - 17.0 g/dL   HCT 22.6 (L) 39.0 - 52.0 %   MCV 97.8 80.0 - 100.0 fL   MCH 32.5 26.0 - 34.0 pg   MCHC 33.2 30.0 - 36.0 g/dL   RDW 17.0 (H) 11.5 - 15.5 %   Platelets 201 150 - 400 K/uL   nRBC 0.0 0.0 - 0.2 %   Neutrophils Relative % 84 %   Neutro  Abs 8.8 (H) 1.7 - 7.7 K/uL   Lymphocytes Relative 6 %   Lymphs Abs 0.6 (L) 0.7 - 4.0 K/uL   Monocytes Relative 8 %   Monocytes Absolute 0.9 0.1 - 1.0 K/uL   Eosinophils Relative 1 %   Eosinophils Absolute 0.1 0.0 - 0.5 K/uL   Basophils Relative 0 %   Basophils Absolute 0.0 0.0 - 0.1 K/uL   Immature Granulocytes 1 %   Abs Immature Granulocytes 0.06 0.00 - 0.07 K/uL    Comment: Performed at [facility] [address], [place], [place] [address]  Comprehensive metabolic panel     Status: Abnormal   Collection Time: 01/10/22  6:39 AM  Result Value Ref Range   Sodium 135 135 - 145 mmol/L    Potassium 4.0 3.5 - 5.1 mmol/L   Chloride 95 (L) 98 - 111 mmol/L   CO2 22 22 - 32 mmol/L   Glucose, Bld 88 70 - 99 mg/dL    Comment: Glucose reference range applies only to samples taken after fasting for at least 8 hours.   BUN 39 (H) 6 - 20 mg/dL   Creatinine, Ser 8.17 (H) 0.61 - 1.24 mg/dL   Calcium 8.8 (L) 8.9 - 10.3 mg/dL   Total Protein 6.3 (L) 6.5 - 8.1 g/dL   Albumin 2.5 (L) 3.5 - 5.0 g/dL   AST 48 (H) 15 - 41 U/L   ALT 15 0 - 44 U/L   Alkaline Phosphatase 80 38 - 126 U/L   Total Bilirubin 1.5 (H) 0.3 - 1.2 mg/dL   GFR, Estimated 7 (L) >60 mL/min    Comment: (NOTE) Calculated using the CKD-EPI Creatinine Equation (2021)    Anion gap 18 (H) 5 - 15    Comment: Performed at [facility], [place] [address] [place], [place] [address]  C-reactive protein     Status: Abnormal   Collection Time: 01/10/22  6:39 AM  Result Value Ref Range   CRP 25.5 (H) <1.0 mg/dL    Comment: Performed at [facility] [address], [place], [place] [address]  Magnesium     Status: None   Collection Time: 01/10/22  6:39 AM  Result Value Ref Range   Magnesium 2.1 1.7 - 2.4 mg/dL    Comment: Performed at [facility] [address] [place], [place] [address]  Procalcitonin     Status: None   Collection Time: 01/10/22  6:39 AM  Result Value Ref Range   Procalcitonin 13.89 ng/mL    Comment:        Interpretation: PCT >= 10 ng/mL: Important systemic inflammatory response, almost exclusively due to severe bacterial sepsis or septic shock. (NOTE)       Sepsis PCT Algorithm           Lower Respiratory Tract                                      Infection PCT Algorithm    ----------------------------     ----------------------------         PCT < 0.25 ng/mL                PCT < 0.10 ng/mL          Strongly encourage             Strongly discourage   discontinuation of antibiotics  initiation of antibiotics    ----------------------------     -----------------------------        PCT 0.25 - 0.50 ng/mL            PCT 0.10 - 0.25 ng/mL               OR       >80% decrease in PCT            Discourage initiation of                                            antibiotics      Encourage discontinuation           of antibiotics    ----------------------------     -----------------------------         PCT >= 0.50 ng/mL              PCT 0.26 - 0.50 ng/mL                AND       <80% decrease in PCT             Encourage initiation of                                             antibiotics       Encourage continuation           of antibiotics    ----------------------------     -----------------------------        PCT >= 0.50 ng/mL                  PCT > 0.50 ng/mL               AND         increase in PCT                  Strongly encourage                                      initiation of antibiotics    Strongly encourage escalation           of antibiotics                                     -----------------------------                                           PCT <= 0.25 ng/mL                                                 OR                                        >  80% decrease in PCT                                      Discontinue / Do not initiate                                             antibiotics  Performed at [facility], [place] [address] [place], [place] [address]   Glucose, capillary     Status: None   Collection Time: 01/10/22  6:56 AM  Result Value Ref Range   Glucose-Capillary 79 70 - 99 mg/dL    Comment: Glucose reference range applies only to samples taken after fasting for at least 8 hours.  Glucose, capillary     Status: None   Collection Time: 01/10/22  8:40 AM  Result Value Ref Range   Glucose-Capillary 85 70 - 99 mg/dL    Comment: Glucose reference range applies only to samples taken after fasting for at least 8 hours.  Glucose, capillary     Status: Abnormal   Collection Time: 01/10/22 11:44 AM  Result Value Ref Range    Glucose-Capillary 160 (H) 70 - 99 mg/dL    Comment: Glucose reference range applies only to samples taken after fasting for at least 8 hours.  CBC     Status: Abnormal   Collection Time: 01/10/22 12:01 PM  Result Value Ref Range   WBC 11.8 (H) 4.0 - 10.5 K/uL   RBC 2.32 (L) 4.22 - 5.81 MIL/uL   Hemoglobin 7.6 (L) 13.0 - 17.0 g/dL   HCT 22.6 (L) 39.0 - 52.0 %   MCV 97.4 80.0 - 100.0 fL   MCH 32.8 26.0 - 34.0 pg   MCHC 33.6 30.0 - 36.0 g/dL   RDW 17.0 (H) 11.5 - 15.5 %   Platelets 214 150 - 400 K/uL   nRBC 0.0 0.0 - 0.2 %    Comment: Performed at [facility] [address] [place], [place] [address]  Glucose, capillary     Status: Abnormal   Collection Time: 01/10/22  4:26 PM  Result Value Ref Range   Glucose-Capillary 149 (H) 70 - 99 mg/dL    Comment: Glucose reference range applies only to samples taken after fasting for at least 8 hours.  Glucose, capillary     Status: None   Collection Time: 01/10/22  9:21 PM  Result Value Ref Range   Glucose-Capillary 80 70 - 99 mg/dL    Comment: Glucose reference range applies only to samples taken after fasting for at least 8 hours.  Procalcitonin     Status: None   Collection Time: 01/11/22  2:39 AM  Result Value Ref Range   Procalcitonin 15.25 ng/mL    Comment:        Interpretation: PCT >= 10 ng/mL: Important systemic inflammatory response, almost exclusively due to severe bacterial sepsis or septic shock. (NOTE)       Sepsis PCT Algorithm           Lower Respiratory Tract                                      Infection PCT Algorithm    ----------------------------     ----------------------------  PCT < 0.25 ng/mL                PCT < 0.10 ng/mL          Strongly encourage             Strongly discourage   discontinuation of antibiotics    initiation of antibiotics    ----------------------------     -----------------------------       PCT 0.25 - 0.50 ng/mL            PCT 0.10 - 0.25 ng/mL               OR        >80% decrease in PCT            Discourage initiation of                                            antibiotics      Encourage discontinuation           of antibiotics    ----------------------------     -----------------------------         PCT >= 0.50 ng/mL              PCT 0.26 - 0.50 ng/mL                AND       <80% decrease in PCT             Encourage initiation of                                             antibiotics       Encourage continuation           of antibiotics    ----------------------------     -----------------------------        PCT >= 0.50 ng/mL                  PCT > 0.50 ng/mL               AND         increase in PCT                  Strongly encourage                                      initiation of antibiotics    Strongly encourage escalation           of antibiotics                                     -----------------------------                                           PCT <= 0.25 ng/mL  OR                                        > 80% decrease in PCT                                      Discontinue / Do not initiate                                             antibiotics  Performed at [facility], [place] [address] [place], [place] [address]   CBC with Differential/Platelet     Status: Abnormal   Collection Time: 01/11/22  2:39 AM  Result Value Ref Range   WBC 9.9 4.0 - 10.5 K/uL   RBC 2.35 (L) 4.22 - 5.81 MIL/uL   Hemoglobin 7.9 (L) 13.0 - 17.0 g/dL   HCT 22.4 (L) 39.0 - 52.0 %   MCV 95.3 80.0 - 100.0 fL   MCH 33.6 26.0 - 34.0 pg   MCHC 35.3 30.0 - 36.0 g/dL   RDW 16.8 (H) 11.5 - 15.5 %   Platelets 241 150 - 400 K/uL   nRBC 0.0 0.0 - 0.2 %   Neutrophils Relative % 82 %   Neutro Abs 8.0 (H) 1.7 - 7.7 K/uL   Lymphocytes Relative 8 %   Lymphs Abs 0.8 0.7 - 4.0 K/uL   Monocytes Relative 6 %   Monocytes Absolute 0.6 0.1 - 1.0 K/uL   Eosinophils Relative 3 %   Eosinophils Absolute  0.3 0.0 - 0.5 K/uL   Basophils Relative 0 %   Basophils Absolute 0.0 0.0 - 0.1 K/uL   Immature Granulocytes 1 %   Abs Immature Granulocytes 0.06 0.00 - 0.07 K/uL    Comment: Performed at [facility], [place] [address], [place], [place] [address]  Magnesium     Status: None   Collection Time: 01/11/22  2:39 AM  Result Value Ref Range   Magnesium 2.1 1.7 - 2.4 mg/dL    Comment: Performed at [facility] [address], [place], [place] [address]  C-reactive protein     Status: Abnormal   Collection Time: 01/11/22  2:39 AM  Result Value Ref Range   CRP 27.8 (H) <1.0 mg/dL    Comment: Performed at [facility] [address], [place],  [address]  Comprehensive metabolic panel     Status: Abnormal   Collection Time: 01/11/22  2:39 AM  Result Value Ref Range   Sodium 136 135 - 145 mmol/L   Potassium 2.9 (L) 3.5 - 5.1 mmol/L    Comment: DELTA CHECK NOTED   Chloride 97 (L) 98 - 111 mmol/L   CO2 27 22 - 32 mmol/L   Glucose, Bld 94 70 - 99 mg/dL    Comment: Glucose reference range applies only to samples taken after fasting for at least 8 hours.   BUN 14 6 - 20 mg/dL   Creatinine, Ser 3.31 (H) 0.61 - 1.24 mg/dL    Comment: DELTA CHECK NOTED   Calcium 8.6 (L) 8.9 - 10.3 mg/dL   Total Protein 6.8 6.5 - 8.1 g/dL   Albumin 2.5 (L) 3.5 - 5.0  g/dL   AST 34 15 - 41 U/L   ALT 12 0 - 44 U/L   Alkaline Phosphatase 108 38 - 126 U/L   Total Bilirubin 0.7 0.3 - 1.2 mg/dL   GFR, Estimated 22 (L) >60 mL/min    Comment: (NOTE) Calculated using the CKD-EPI Creatinine Equation (2021)    Anion gap 12 5 - 15    Comment: Performed at [facility] [address], [place], [place] [address]  Glucose, capillary     Status: Abnormal   Collection Time: 01/11/22  6:47 AM  Result Value Ref Range   Glucose-Capillary 114 (H) 70 - 99 mg/dL    Comment: Glucose reference range applies only to samples taken after fasting for at least 8 hours.  Glucose, capillary     Status:  Abnormal   Collection Time: 01/11/22 11:36 AM  Result Value Ref Range   Glucose-Capillary 46 (L) 70 - 99 mg/dL    Comment: Glucose reference range applies only to samples taken after fasting for at least 8 hours.  Glucose, capillary     Status: Abnormal   Collection Time: 01/11/22 11:41 AM  Result Value Ref Range   Glucose-Capillary 51 (L) 70 - 99 mg/dL    Comment: Glucose reference range applies only to samples taken after fasting for at least 8 hours.  Glucose, capillary     Status: None   Collection Time: 01/11/22 12:18 PM  Result Value Ref Range   Glucose-Capillary 74 70 - 99 mg/dL    Comment: Glucose reference range applies only to samples taken after fasting for at least 8 hours.  Vancomycin, random     Status: None   Collection Time: 01/11/22 12:42 PM  Result Value Ref Range   Vancomycin Rm 33     Comment:        Random Vancomycin therapeutic range is dependent on dosage and time of specimen collection. A peak range is 20.0-40.0 ug/mL A trough range is 5.0-15.0 ug/mL        Performed at [facility] [address] [place], [place] [address]   Glucose, capillary     Status: Abnormal   Collection Time: 01/11/22 12:52 PM  Result Value Ref Range   Glucose-Capillary 119 (H) 70 - 99 mg/dL    Comment: Glucose reference range applies only to samples taken after fasting for at least 8 hours.    No results found.    Blood pressure 113/67, pulse 80, temperature 99.2 F (37.3 C), temperature source Oral, resp. rate 15, height 6' 1" (1.854 m), weight 88.3 kg, SpO2 100 %.  General appearance: alert, cooperative, and appears stated age Head: Normocephalic, without obvious abnormality, atraumatic Neck: supple, symmetrical, trachea midline Extremities: left hand: ring finger amputation site healed.  Index finger with breakdown of wound and exposed bone.  No erythema or drainage or signs of infection.  Right hand with dry gangrene index and long finger distal and part of  middle phalanges.  Small finger with dry necrosis of distal phalanx.  Ring finger with dry skin at tip.  No necrosis at this time. Pulses: 2+ and symmetric Skin:  No rashes or lesions Neurologic: Grossly normal Incision/Wound: as above  Assessment/Plan Necrosis of multiple digits.  Will need amputation of left index finger at mp level.  Will need amputation of right index, long, and small digits.  Discussed doing this at mp level to try to maximize healing potential vs at pip or proximal phalanx to try to  maintain some length and function to digits.  He would like to think about the level overnight.  Risks, benefits and alternatives of surgery were discussed including risks of blood loss, infection, damage to nerves/vessels/tendons/ligament/bone, failure of surgery, need for additional surgery, complication with wound healing, stiffness, need for further amputation.  He voiced understanding of these risks and elected to proceed.  NPO after midnight.  Hold anticoagulants in AM.  Leanora Cover 01/11/2022, 3:05 PM

## 2022-01-11 NOTE — H&P (View-Only) (Signed)
Edward [name] is an 51 y.o. male.   Consult: finger necrosis, wound breakdown HPI: 51 yo male on hemodialysis.  Has have amputation left index and ring finger with breakdown of left index finger wound.  Proximal phalanx head exposed in wound.  Also with dry gangrene of right index and long fingers and in past couple of days small finger has turned black.    Allergies:  Allergies  Allergen Reactions   Crestor [Rosuvastatin] Rash    Also had a red eye   Dilaudid [Hydromorphone] Itching   Benadryl [Diphenhydramine] Rash    Past Medical History:  Diagnosis Date   Allergy    Anemia    getting Venofer with dialysis   Anxiety    self reported, sometimes-situational anxiety   Arthritis    bilateral hands/LEFT hip   CHF (congestive heart failure) (HCC)    Chronic kidney disease    t th s dialysis- [place] ---ESRD   Complication of anesthesia    slow to go to sleep   Coronary artery disease    Depression    self reported, sometimes-situational depression   Diabetes mellitus    type II- on meds   Dyspnea    sometimes- fluid   GERD (gastroesophageal reflux disease)    on meds   Hypertension    no longer on medication since starting on Hemodialysis   Myocardial infarction [facility]) 2019   Sleep apnea     Past Surgical History:  Procedure Laterality Date   A/V FISTULAGRAM Left 03/06/2020   Procedure: A/V FISTULAGRAM;  Surgeon: Waynetta Sandy, MD;  Location: [facility];  Service: Cardiovascular;  Laterality: Left;   ABDOMINAL AORTOGRAM N/A 01/07/2022   Procedure: ABDOMINAL AORTOGRAM;  Surgeon: Waynetta Sandy, MD;  Location: [facility];  Service: Cardiovascular;  Laterality: N/A;   ABDOMINAL AORTOGRAM W/LOWER EXTREMITY N/A 04/09/2021   Procedure: ABDOMINAL AORTOGRAM W/LOWER EXTREMITY;  Surgeon: Waynetta Sandy, MD;  Location: [facility];  Service: Cardiovascular;  Laterality: N/A;   AMPUTATION Left 05/10/2016   Procedure: Left  Transmetatarsal Amputation;  Surgeon: [name], MD;  Location: [facility];  Service: Orthopedics;  Laterality: Left;   AMPUTATION Right 05/16/2018   Procedure: PARTIAL RAY AMPUTATION FIFTH TOE RIGHT FOOT;  Surgeon: [name], DPM;  Location: [facility];  Service: Podiatry;  Laterality: Right;   AMPUTATION Right 06/17/2018   Procedure: AMPUTATION 4th RAY Right Foot;  Surgeon: Trula Slade, DPM;  Location: [facility];  Service: Podiatry;  Laterality: Right;   AMPUTATION Right 06/19/2018   Procedure: RIGHT BELOW KNEE AMPUTATION;  Surgeon: [name], MD;  Location: [facility];  Service: Orthopedics;  Laterality: Right;   AMPUTATION Left 10/05/2021   Procedure: LEFT RING FINGER AMPUTATION;  Surgeon: Daryll Brod, MD;  Location: [place] [facility];  Service: Orthopedics;  Laterality: Left;  45 MIN   AMPUTATION Left 11/15/2021   Procedure: LEFT INDEX FINGER AMPUTATION;  Surgeon: Leanora Cover, MD;  Location: [facility];  Service: Orthopedics;  Laterality: Left;  60 MIN   AMPUTATION Right 01/08/2022   Procedure: AMPUTATION ABOVE KNEE;  Surgeon: Waynetta Sandy, MD;  Location: [facility];  Service: Vascular;  Laterality: Right;   AORTIC ARCH ANGIOGRAPHY N/A 01/07/2022   Procedure: AORTIC ARCH ANGIOGRAPHY;  Surgeon: Waynetta Sandy, MD;  Location: [facility];  Service: Cardiovascular;  Laterality: N/A;   APPLICATION OF WOUND VAC Right 06/19/2018   Procedure: APPLICATION OF WOUND VAC;  Surgeon: Meridee Score  V, MD;  Location: [facility];  Service: Orthopedics;  Laterality: Right;   AV FISTULA PLACEMENT Left 11/25/2019   Procedure: LEFT ARM Brachiocephalic  ARTERIOVENOUS (AV) FISTULA CREATION;  Surgeon: Rosetta Posner, MD;  Location: [facility];  Service: Vascular;  Laterality: Left;   [place] Left 07/19/2020   Procedure: LEFT SECOND STAGE [place];  Surgeon: Waynetta Sandy, MD;  Location: [facility];  Service: Vascular;  Laterality: Left;   CARDIAC CATHETERIZATION  07/29/2018    CIRCUMCISION  09/02/2011   Procedure: CIRCUMCISION ADULT;  Surgeon: Molli Hazard, MD;  Location: [facility];  Service: Urology;  Laterality: N/A;   CORONARY STENT INTERVENTION N/A 07/29/2018   Procedure: CORONARY STENT INTERVENTION;  Surgeon: Leonie Man, MD;  Location: [facility];  Service: Cardiovascular;  Laterality: N/A;   DEBRIDEMENT AND CLOSURE WOUND Left 11/15/2021   Procedure: LEFT HAND WOUND CLOSURE;  Surgeon: Leanora Cover, MD;  Location: [facility];  Service: Orthopedics;  Laterality: Left;   I & D EXTREMITY  09/02/2011   Procedure: IRRIGATION AND DEBRIDEMENT EXTREMITY;  Surgeon: Theodoro Kos, DO;  Location: [facility];  Service: Plastics;  Laterality: N/A;   INCISION AND DRAINAGE OF WOUND  08/12/2011   Procedure: IRRIGATION AND DEBRIDEMENT WOUND;  Surgeon: Zenovia Jarred, MD;  Location: [facility];  Service: General;;  perineum   INSERTION OF DIALYSIS CATHETER N/A 11/25/2019   Procedure: INSERTION OF [place] Internal Jugular DIALYSIS CATHETER;  Surgeon: Rosetta Posner, MD;  Location: [facility];  Service: Vascular;  Laterality: N/A;   [place]  08/12/2011   Procedure: IRRIGATION AND DEBRIDEMENT ABSCESS;  Surgeon: Molli Hazard, MD;  Location: [facility];  Service: Urology;  Laterality: N/A;  irrigation and debridment perineum     IRRIGATION AND DEBRIDEMENT ABSCESS  08/14/2011   Procedure: MINOR INCISION AND DRAINAGE OF ABSCESS;  Surgeon: Molli Hazard, MD;  Location: [facility];  Service: Urology;  Laterality: N/A;  Perineal Wound Debridement;Placement Bilateral Testicular Thigh Pouches   LEFT HEART CATH AND CORONARY ANGIOGRAPHY N/A 07/29/2018   Procedure: LEFT HEART CATH AND CORONARY ANGIOGRAPHY;  Surgeon: Leonie Man, MD;  Location: [facility];  Service: Cardiovascular;  Laterality: N/A;   LOWER EXTREMITY ANGIOGRAPHY Bilateral 01/07/2022   Procedure: Lower Extremity Angiography;  Surgeon: Waynetta Sandy, MD;  Location: [facility];  Service: Cardiovascular;  Laterality: Bilateral;   PERIPHERAL VASCULAR ATHERECTOMY Left 04/09/2021   Procedure: PERIPHERAL VASCULAR ATHERECTOMY;  Surgeon: Waynetta Sandy, MD;  Location: [facility];  Service: Cardiovascular;  Laterality: Left;  TP trunk and peroneal arteries   PERIPHERAL VASCULAR BALLOON ANGIOPLASTY Left 04/09/2021   Procedure: PERIPHERAL VASCULAR BALLOON ANGIOPLASTY;  Surgeon: Waynetta Sandy, MD;  Location: [facility];  Service: Cardiovascular;  Laterality: Left;  Popliteal   REVISON OF ARTERIOVENOUS FISTULA Left 05/24/2020   Procedure: LEFT ARM ARTERIOVENOUS FISTULA  CONVERSION TO BASILIC VEIN FISTULA;  Surgeon: Waynetta Sandy, MD;  Location: [facility];  Service: Vascular;  Laterality: Left;   TOTAL HIP ARTHROPLASTY Left 10/11/2016   Procedure: LEFT TOTAL HIP ARTHROPLASTY ANTERIOR APPROACH;  Surgeon: Mcarthur Rossetti, MD;  Location[facility];  Service: Orthopedics;  Laterality: Left;   UPPER EXTREMITY ANGIOGRAPHY Bilateral 01/07/2022   Procedure: Upper Extremity Angiography;  Surgeon: Waynetta Sandy, MD;  Location: [facility];  Service: Cardiovascular;  Laterality: Bilateral;    Family History: Family History  Problem  Relation Age of Onset   Diabetes Other    Pancreatic cancer Mother 78   Colon cancer Neg Hx    Colon polyps Neg Hx    Esophageal cancer Neg Hx    Rectal cancer Neg Hx    Stomach cancer Neg Hx     Social History:   reports that he quit smoking about 2 years ago. His smoking use included cigarettes. He has a 6.00 pack-year smoking history. He has never used smokeless tobacco. He reports that he does not currently use alcohol. He reports that he does not use drugs.  Medications: Medications Prior to Admission  Medication Sig Dispense Refill   acetaminophen (TYLENOL) 500 MG tablet Take 500-1,000 mg by mouth every 6 (six) hours as needed for  moderate pain or headache.     aspirin EC 81 MG tablet Take 1 tablet (81 mg total) by mouth daily. Swallow whole. 90 tablet 3   atorvastatin (LIPITOR) 80 MG tablet Take 80 mg by mouth daily.     clopidogrel (PLAVIX) 75 MG tablet Take 1 tablet (75 mg total) by mouth daily. 30 tablet 11   gabapentin (NEURONTIN) 100 MG capsule Take 100 mg by mouth 3 (three) times daily.     insulin aspart protamine - aspart (NOVOLOG MIX 70/30 FLEXPEN) (70-30) 100 UNIT/ML FlexPen Inject 0.3 mLs (30 Units total) into the skin 2 (two) times daily with a meal. 15 mL 0   lidocaine-prilocaine (EMLA) cream Apply 1 application topically See admin instructions. Apply a small amount to access site (AVF) 1-2 hours before dialysis. Cover with occlusive dressing (saran wrap). Tuesday,Thursday,saturday     pantoprazole (PROTONIX) 40 MG tablet Take 1 tablet (40 mg total) by mouth in the morning and at bedtime. (Patient taking differently: Take 40 mg by mouth daily.) 60 tablet 3   traMADol (ULTRAM) 50 MG tablet Take 50 mg by mouth 2 (two) times daily as needed.     VELPHORO 500 MG chewable tablet Chew 1,000 mg by mouth 3 (three) times daily with meals.     bismuth subsalicylate (PEPTO BISMOL) 262 MG/15ML suspension Take 30 mLs by mouth every 6 (six) hours as needed for indigestion.     blood glucose meter kit and supplies Dispense based on patient and insurance preference. Use up to four times daily as directed. (FOR ICD-9 250.00, 250.01). 1 each 0   Insulin Pen Needle 31G X 5 MM MISC Use daily to inject insulin as instructed 100 each 2   linaclotide (LINZESS) 72 MCG capsule Take 1 capsule (72 mcg total) by mouth daily before breakfast. (Patient taking differently: Take 72 mcg by mouth daily as needed (constipation).) 8 capsule 0   triamcinolone ointment (KENALOG) 0.1 % 1 application. daily as needed (rash).      Results for orders placed or performed during the hospital encounter of 01/07/22 (from the past 48 hour(s))  Glucose,  capillary     Status: None   Collection Time: 01/09/22  4:08 PM  Result Value Ref Range   Glucose-Capillary 99 70 - 99 mg/dL    Comment: Glucose reference range applies only to samples taken after fasting for at least 8 hours.  MRSA Next Gen by PCR, Nasal     Status: None   Collection Time: 01/09/22  6:03 PM   Specimen: Nasal Mucosa; Nasal Swab  Result Value Ref Range   MRSA by PCR Next Gen NOT DETECTED NOT DETECTED    Comment: (NOTE) The GeneXpert MRSA Assay (FDA approved for NASAL   specimens only), is one component of a comprehensive MRSA colonization surveillance program. It is not intended to diagnose MRSA infection nor to guide or monitor treatment for MRSA infections. Test performance is not FDA approved in patients less than 2 years old. Performed at [facility], [address] [place], [place] [address]   Lactic acid, plasma     Status: Abnormal   Collection Time: 01/09/22  6:03 PM  Result Value Ref Range   Lactic Acid, Venous 5.8 (HH) 0.5 - 1.9 mmol/L    Comment: CRITICAL RESULT CALLED TO, READ BACK BY AND VERIFIED WITH: HINSHAW C, RN ON 01/09/22 AT 1855 BY APPIAH D Performed at [facility], [address] [place], [place] [address]   Glucose, capillary     Status: None   Collection Time: 01/09/22  9:14 PM  Result Value Ref Range   Glucose-Capillary 85 70 - 99 mg/dL    Comment: Glucose reference range applies only to samples taken after fasting for at least 8 hours.  CBC     Status: Abnormal   Collection Time: 01/09/22 10:45 PM  Result Value Ref Range   WBC 11.2 (H) 4.0 - 10.5 K/uL   RBC 2.18 (L) 4.22 - 5.81 MIL/uL   Hemoglobin 6.9 (LL) 13.0 - 17.0 g/dL    Comment: REPEATED TO VERIFY THIS CRITICAL RESULT HAS VERIFIED AND BEEN CALLED TO W COUNCIL RN BY CANDACE HAYES ON 05 24 2023 AT 2324, AND HAS BEEN READ BACK.     HCT 22.0 (L) 39.0 - 52.0 %   MCV 100.9 (H) 80.0 - 100.0 fL   MCH 31.7 26.0 - 34.0 pg   MCHC 31.4 30.0 - 36.0 g/dL   RDW 15.9 (H) 11.5 -  15.5 %   Platelets 210 150 - 400 K/uL   nRBC 0.0 0.0 - 0.2 %    Comment: Performed at [facility], [address] [place], [place] [address]  Lactic acid, plasma     Status: None   Collection Time: 01/09/22 10:45 PM  Result Value Ref Range   Lactic Acid, Venous 1.6 0.5 - 1.9 mmol/L    Comment: Performed at [facility], [address] [place], [place] [address]  Prepare RBC (crossmatch)     Status: None   Collection Time: 01/10/22 12:21 AM  Result Value Ref Range   Order Confirmation      ORDER PROCESSED BY BLOOD BANK Performed at [facility], [address] ,  [address]   Glucose, capillary     Status: None   Collection Time: 01/10/22 12:56 AM  Result Value Ref Range   Glucose-Capillary 82 70 - 99 mg/dL    Comment: Glucose reference range applies only to samples taken after fasting for at least 8 hours.  CBC with Differential/Platelet     Status: Abnormal   Collection Time: 01/10/22  6:39 AM  Result Value Ref Range   WBC 10.5 4.0 - 10.5 K/uL   RBC 2.31 (L) 4.22 - 5.81 MIL/uL   Hemoglobin 7.5 (L) 13.0 - 17.0 g/dL   HCT 22.6 (L) 39.0 - 52.0 %   MCV 97.8 80.0 - 100.0 fL   MCH 32.5 26.0 - 34.0 pg   MCHC 33.2 30.0 - 36.0 g/dL   RDW 17.0 (H) 11.5 - 15.5 %   Platelets 201 150 - 400 K/uL   nRBC 0.0 0.0 - 0.2 %   Neutrophils Relative % 84 %   Neutro   Abs 8.8 (H) 1.7 - 7.7 K/uL   Lymphocytes Relative 6 %   Lymphs Abs 0.6 (L) 0.7 - 4.0 K/uL   Monocytes Relative 8 %   Monocytes Absolute 0.9 0.1 - 1.0 K/uL   Eosinophils Relative 1 %   Eosinophils Absolute 0.1 0.0 - 0.5 K/uL   Basophils Relative 0 %   Basophils Absolute 0.0 0.0 - 0.1 K/uL   Immature Granulocytes 1 %   Abs Immature Granulocytes 0.06 0.00 - 0.07 K/uL    Comment: Performed at [facility] [address], [place], [place] [address]  Comprehensive metabolic panel     Status: Abnormal   Collection Time: 01/10/22  6:39 AM  Result Value Ref Range   Sodium 135 135 - 145 mmol/L    Potassium 4.0 3.5 - 5.1 mmol/L   Chloride 95 (L) 98 - 111 mmol/L   CO2 22 22 - 32 mmol/L   Glucose, Bld 88 70 - 99 mg/dL    Comment: Glucose reference range applies only to samples taken after fasting for at least 8 hours.   BUN 39 (H) 6 - 20 mg/dL   Creatinine, Ser 8.17 (H) 0.61 - 1.24 mg/dL   Calcium 8.8 (L) 8.9 - 10.3 mg/dL   Total Protein 6.3 (L) 6.5 - 8.1 g/dL   Albumin 2.5 (L) 3.5 - 5.0 g/dL   AST 48 (H) 15 - 41 U/L   ALT 15 0 - 44 U/L   Alkaline Phosphatase 80 38 - 126 U/L   Total Bilirubin 1.5 (H) 0.3 - 1.2 mg/dL   GFR, Estimated 7 (L) >60 mL/min    Comment: (NOTE) Calculated using the CKD-EPI Creatinine Equation (2021)    Anion gap 18 (H) 5 - 15    Comment: Performed at [facility], [place] [address] [place], [place] [address]  C-reactive protein     Status: Abnormal   Collection Time: 01/10/22  6:39 AM  Result Value Ref Range   CRP 25.5 (H) <1.0 mg/dL    Comment: Performed at [facility] [address], [place], [place] [address]  Magnesium     Status: None   Collection Time: 01/10/22  6:39 AM  Result Value Ref Range   Magnesium 2.1 1.7 - 2.4 mg/dL    Comment: Performed at [facility] [address] [place], [place] [address]  Procalcitonin     Status: None   Collection Time: 01/10/22  6:39 AM  Result Value Ref Range   Procalcitonin 13.89 ng/mL    Comment:        Interpretation: PCT >= 10 ng/mL: Important systemic inflammatory response, almost exclusively due to severe bacterial sepsis or septic shock. (NOTE)       Sepsis PCT Algorithm           Lower Respiratory Tract                                      Infection PCT Algorithm    ----------------------------     ----------------------------         PCT < 0.25 ng/mL                PCT < 0.10 ng/mL          Strongly encourage             Strongly discourage   discontinuation of antibiotics  initiation of antibiotics    ----------------------------     -----------------------------        PCT 0.25 - 0.50 ng/mL            PCT 0.10 - 0.25 ng/mL               OR       >80% decrease in PCT            Discourage initiation of                                            antibiotics      Encourage discontinuation           of antibiotics    ----------------------------     -----------------------------         PCT >= 0.50 ng/mL              PCT 0.26 - 0.50 ng/mL                AND       <80% decrease in PCT             Encourage initiation of                                             antibiotics       Encourage continuation           of antibiotics    ----------------------------     -----------------------------        PCT >= 0.50 ng/mL                  PCT > 0.50 ng/mL               AND         increase in PCT                  Strongly encourage                                      initiation of antibiotics    Strongly encourage escalation           of antibiotics                                     -----------------------------                                           PCT <= 0.25 ng/mL                                                 OR                                        >  80% decrease in PCT                                      Discontinue / Do not initiate                                             antibiotics  Performed at [facility], [place] [address] [place], [place] [address]   Glucose, capillary     Status: None   Collection Time: 01/10/22  6:56 AM  Result Value Ref Range   Glucose-Capillary 79 70 - 99 mg/dL    Comment: Glucose reference range applies only to samples taken after fasting for at least 8 hours.  Glucose, capillary     Status: None   Collection Time: 01/10/22  8:40 AM  Result Value Ref Range   Glucose-Capillary 85 70 - 99 mg/dL    Comment: Glucose reference range applies only to samples taken after fasting for at least 8 hours.  Glucose, capillary     Status: Abnormal   Collection Time: 01/10/22 11:44 AM  Result Value Ref Range    Glucose-Capillary 160 (H) 70 - 99 mg/dL    Comment: Glucose reference range applies only to samples taken after fasting for at least 8 hours.  CBC     Status: Abnormal   Collection Time: 01/10/22 12:01 PM  Result Value Ref Range   WBC 11.8 (H) 4.0 - 10.5 K/uL   RBC 2.32 (L) 4.22 - 5.81 MIL/uL   Hemoglobin 7.6 (L) 13.0 - 17.0 g/dL   HCT 22.6 (L) 39.0 - 52.0 %   MCV 97.4 80.0 - 100.0 fL   MCH 32.8 26.0 - 34.0 pg   MCHC 33.6 30.0 - 36.0 g/dL   RDW 17.0 (H) 11.5 - 15.5 %   Platelets 214 150 - 400 K/uL   nRBC 0.0 0.0 - 0.2 %    Comment: Performed at [facility] [address] [place], [place] [address]  Glucose, capillary     Status: Abnormal   Collection Time: 01/10/22  4:26 PM  Result Value Ref Range   Glucose-Capillary 149 (H) 70 - 99 mg/dL    Comment: Glucose reference range applies only to samples taken after fasting for at least 8 hours.  Glucose, capillary     Status: None   Collection Time: 01/10/22  9:21 PM  Result Value Ref Range   Glucose-Capillary 80 70 - 99 mg/dL    Comment: Glucose reference range applies only to samples taken after fasting for at least 8 hours.  Procalcitonin     Status: None   Collection Time: 01/11/22  2:39 AM  Result Value Ref Range   Procalcitonin 15.25 ng/mL    Comment:        Interpretation: PCT >= 10 ng/mL: Important systemic inflammatory response, almost exclusively due to severe bacterial sepsis or septic shock. (NOTE)       Sepsis PCT Algorithm           Lower Respiratory Tract                                      Infection PCT Algorithm    ----------------------------     ----------------------------  PCT < 0.25 ng/mL                PCT < 0.10 ng/mL          Strongly encourage             Strongly discourage   discontinuation of antibiotics    initiation of antibiotics    ----------------------------     -----------------------------       PCT 0.25 - 0.50 ng/mL            PCT 0.10 - 0.25 ng/mL               OR        >80% decrease in PCT            Discourage initiation of                                            antibiotics      Encourage discontinuation           of antibiotics    ----------------------------     -----------------------------         PCT >= 0.50 ng/mL              PCT 0.26 - 0.50 ng/mL                AND       <80% decrease in PCT             Encourage initiation of                                             antibiotics       Encourage continuation           of antibiotics    ----------------------------     -----------------------------        PCT >= 0.50 ng/mL                  PCT > 0.50 ng/mL               AND         increase in PCT                  Strongly encourage                                      initiation of antibiotics    Strongly encourage escalation           of antibiotics                                     -----------------------------                                           PCT <= 0.25 ng/mL                                                   OR                                        > 80% decrease in PCT                                      Discontinue / Do not initiate                                             antibiotics  Performed at [facility], [address] [place], [place] [address]   CBC with Differential/Platelet     Status: Abnormal   Collection Time: 01/11/22  2:39 AM  Result Value Ref Range   WBC 9.9 4.0 - 10.5 K/uL   RBC 2.35 (L) 4.22 - 5.81 MIL/uL   Hemoglobin 7.9 (L) 13.0 - 17.0 g/dL   HCT 22.4 (L) 39.0 - 52.0 %   MCV 95.3 80.0 - 100.0 fL   MCH 33.6 26.0 - 34.0 pg   MCHC 35.3 30.0 - 36.0 g/dL   RDW 16.8 (H) 11.5 - 15.5 %   Platelets 241 150 - 400 K/uL   nRBC 0.0 0.0 - 0.2 %   Neutrophils Relative % 82 %   Neutro Abs 8.0 (H) 1.7 - 7.7 K/uL   Lymphocytes Relative 8 %   Lymphs Abs 0.8 0.7 - 4.0 K/uL   Monocytes Relative 6 %   Monocytes Absolute 0.6 0.1 - 1.0 K/uL   Eosinophils Relative 3 %   Eosinophils Absolute  0.3 0.0 - 0.5 K/uL   Basophils Relative 0 %   Basophils Absolute 0.0 0.0 - 0.1 K/uL   Immature Granulocytes 1 %   Abs Immature Granulocytes 0.06 0.00 - 0.07 K/uL    Comment: Performed at [facility], [address] [place], [place] [address]  Magnesium     Status: None   Collection Time: 01/11/22  2:39 AM  Result Value Ref Range   Magnesium 2.1 1.7 - 2.4 mg/dL    Comment: Performed at [facility], [address] [place], [place] [address]  C-reactive protein     Status: Abnormal   Collection Time: 01/11/22  2:39 AM  Result Value Ref Range   CRP 27.8 (H) <1.0 mg/dL    Comment: Performed at  Hospital [facility], [address] , [place] [address]  Comprehensive metabolic panel     Status: Abnormal   Collection Time: 01/11/22  2:39 AM  Result Value Ref Range   Sodium 136 135 - 145 mmol/L   Potassium 2.9 (L) 3.5 - 5.1 mmol/L    Comment: DELTA CHECK NOTED   Chloride 97 (L) 98 - 111 mmol/L   CO2 27 22 - 32 mmol/L   Glucose, Bld 94 70 - 99 mg/dL    Comment: Glucose reference range applies only to samples taken after fasting for at least 8 hours.   BUN 14 6 - 20 mg/dL   Creatinine, Ser 3.31 (H) 0.61 - 1.24 mg/dL    Comment: DELTA CHECK NOTED   Calcium 8.6 (L) 8.9 - 10.3 mg/dL   Total Protein 6.8 6.5 - 8.1 g/dL   Albumin 2.5 (L) 3.5 - 5.0   g/dL   AST 34 15 - 41 U/L   ALT 12 0 - 44 U/L   Alkaline Phosphatase 108 38 - 126 U/L   Total Bilirubin 0.7 0.3 - 1.2 mg/dL   GFR, Estimated 22 (L) >60 mL/min    Comment: (NOTE) Calculated using the CKD-EPI Creatinine Equation (2021)    Anion gap 12 5 - 15    Comment: Performed at [facility] [address], [place], [place] [address]  Glucose, capillary     Status: Abnormal   Collection Time: 01/11/22  6:47 AM  Result Value Ref Range   Glucose-Capillary 114 (H) 70 - 99 mg/dL    Comment: Glucose reference range applies only to samples taken after fasting for at least 8 hours.  Glucose, capillary     Status:  Abnormal   Collection Time: 01/11/22 11:36 AM  Result Value Ref Range   Glucose-Capillary 46 (L) 70 - 99 mg/dL    Comment: Glucose reference range applies only to samples taken after fasting for at least 8 hours.  Glucose, capillary     Status: Abnormal   Collection Time: 01/11/22 11:41 AM  Result Value Ref Range   Glucose-Capillary 51 (L) 70 - 99 mg/dL    Comment: Glucose reference range applies only to samples taken after fasting for at least 8 hours.  Glucose, capillary     Status: None   Collection Time: 01/11/22 12:18 PM  Result Value Ref Range   Glucose-Capillary 74 70 - 99 mg/dL    Comment: Glucose reference range applies only to samples taken after fasting for at least 8 hours.  Vancomycin, random     Status: None   Collection Time: 01/11/22 12:42 PM  Result Value Ref Range   Vancomycin Rm 33     Comment:        Random Vancomycin therapeutic range is dependent on dosage and time of specimen collection. A peak range is 20.0-40.0 ug/mL A trough range is 5.0-15.0 ug/mL        Performed at [facility] [address] [place], [place] [address]   Glucose, capillary     Status: Abnormal   Collection Time: 01/11/22 12:52 PM  Result Value Ref Range   Glucose-Capillary 119 (H) 70 - 99 mg/dL    Comment: Glucose reference range applies only to samples taken after fasting for at least 8 hours.    No results found.    Blood pressure 113/67, pulse 80, temperature 99.2 F (37.3 C), temperature source Oral, resp. rate 15, height 6' 1" (1.854 m), weight 88.3 kg, SpO2 100 %.  General appearance: alert, cooperative, and appears stated age Head: Normocephalic, without obvious abnormality, atraumatic Neck: supple, symmetrical, trachea midline Extremities: left hand: ring finger amputation site healed.  Index finger with breakdown of wound and exposed bone.  No erythema or drainage or signs of infection.  Right hand with dry gangrene index and long finger distal and part of  middle phalanges.  Small finger with dry necrosis of distal phalanx.  Ring finger with dry skin at tip.  No necrosis at this time. Pulses: 2+ and symmetric Skin:  No rashes or lesions Neurologic: Grossly normal Incision/Wound: as above  Assessment/Plan Necrosis of multiple digits.  Will need amputation of left index finger at mp level.  Will need amputation of right index, long, and small digits.  Discussed doing this at mp level to try to maximize healing potential vs at pip or proximal phalanx to try to  maintain some length and function to digits.  He would like to think about the level overnight.  Risks, benefits and alternatives of surgery were discussed including risks of blood loss, infection, damage to nerves/vessels/tendons/ligament/bone, failure of surgery, need for additional surgery, complication with wound healing, stiffness, need for further amputation.  He voiced understanding of these risks and elected to proceed.  NPO after midnight.  Hold anticoagulants in AM.  Christyanna Mckeon 01/11/2022, 3:05 PM  

## 2022-01-11 NOTE — Progress Notes (Signed)
Inpatient Rehab Admissions Coordinator:   PT updated recs to [facility] in agreement with OT.  CIR will sign off at this time.   Shann Medal, PT, DPT Admissions Coordinator [phone] 01/11/22  8:50 AM

## 2022-01-11 NOTE — Progress Notes (Signed)
[facility] Progress Note   Subjective:   Afebrile and hemodynamically stable overnight-  had HD early this AM-  removed 3000- BP tolerated well -  completely back to normal apologizing for behavior the other day   Objective Vitals:   01/11/22 0351 01/11/22 0403 01/11/22 0500 01/11/22 0600  BP:  (!) 112/96 (!) 133/100 130/78  Pulse:  84 88 79  Resp:  15 (!) 21 13  Temp: 98.1 F (36.7 C)     TempSrc: Oral     SpO2:  100% 97% 95%  Weight:    88.3 kg  Height:       Physical Exam General:chronically ill appearing male in NAD Heart:RRR, no mrg Lungs:CTAB, nml WOB on RA Abdomen:soft, NTND Extremities:R AKA, L TMA dressing on foot/ankle - trace edema Dialysis Access: left AVF-  patent   Filed Weights   01/08/22 1230 01/08/22 1259 01/11/22 0600  Weight: 104.3 kg 104.3 kg 88.3 kg    Intake/Output Summary (Last 24 hours) at 01/11/2022 0759 Last data filed at 01/11/2022 0600 Gross per 24 hour  Intake 650 ml  Output 3000 ml  Net -2350 ml    Additional Objective Labs: Basic Metabolic Panel: Recent Labs  Lab 01/08/22 0543 01/08/22 0643 01/09/22 0309 01/10/22 0639 01/11/22 0239  NA  --    < > 134* 135 136  K  --    < > 4.2 4.0 2.9*  CL  --    < > 96* 95* 97*  CO2  --    < > '24 22 27  '$ GLUCOSE  --    < > 122* 88 94  BUN  --    < > 26* 39* 14  CREATININE  --    < > 6.00* 8.17* 3.31*  CALCIUM  --    < > 8.6* 8.8* 8.6*  PHOS 9.1*  --   --   --   --    < > = values in this interval not displayed.   Liver Function Tests: Recent Labs  Lab 01/09/22 0309 01/10/22 0639 01/11/22 0239  AST 35 48* 34  ALT '17 15 12  '$ ALKPHOS 70 80 108  BILITOT 1.0 1.5* 0.7  PROT 6.3* 6.3* 6.8  ALBUMIN 2.5* 2.5* 2.5*    CBC: Recent Labs  Lab 01/09/22 0309 01/09/22 2245 01/10/22 0639 01/10/22 1201 01/11/22 0239  WBC 12.2* 11.2* 10.5 11.8* 9.9  NEUTROABS 10.3*  --  8.8*  --  8.0*  HGB 7.4* 6.9* 7.5* 7.6* 7.9*  HCT 22.4* 22.0* 22.6* 22.6* 22.4*  MCV 99.6 100.9* 97.8  97.4 95.3  PLT 225 210 201 214 241    CBG: Recent Labs  Lab 01/10/22 0840 01/10/22 1144 01/10/22 1626 01/10/22 2121 01/11/22 0647  GLUCAP 85 160* 149* 80 114*    Studies/Results: DG Chest Port 1 View  Result Date: 01/09/2022 CLINICAL DATA:  Shortness of breath EXAM: PORTABLE CHEST 1 VIEW COMPARISON:  Chest x-ray dated Jan 08, 2022 FINDINGS: Unchanged cardiomegaly. Mild bilateral heterogeneous opacities. No large pleural effusion or pneumothorax. IMPRESSION: 1. Mild bilateral heterogeneous opacities, likely due to pulmonary edema. 2. Unchanged cardiomegaly. Electronically Signed   By: Yetta Glassman M.D.   On: 01/09/2022 13:54    Medications:  sodium chloride     meropenem (MERREM) IV 500 mg (01/10/22 2001)   vancomycin 1,000 mg (01/11/22 0350)    acetaminophen  1,000 mg Oral Q6H   aspirin EC  81 mg Oral Daily   atorvastatin  80 mg Oral  Daily   Chlorhexidine Gluconate Cloth  6 each Topical Q0600   Chlorhexidine Gluconate Cloth  6 each Topical Q0600   clopidogrel  75 mg Oral Daily   darbepoetin (ARANESP) injection - DIALYSIS  100 mcg Intravenous Q Thu-HD   doxercalciferol  11 mcg Intravenous Q T,Th,Sa-HD   ezetimibe  10 mg Oral Daily   gabapentin  100 mg Oral BID   heparin  5,000 Units Subcutaneous Q8H   insulin aspart protamine- aspart  15 Units Subcutaneous BID WC   linaclotide  72 mcg Oral QAC breakfast   pantoprazole  40 mg Oral BID   polyethylene glycol  17 g Oral Daily   sucroferric oxyhydroxide  1,000 mg Oral TID WC    Dialysis Orders: [facility]  4.5h  425/700  94kg  2/2 bath  LUA AVF   Heparin 8000+ 2073mdrun prn - venofer 100 mg tiw IV , 5/04- 5/25 - mircera 30 ug q4 wks, last 5/4, due early June - hectorol 11 ug tiw IV - last HD 5/20, 93.3 > 92.0 kg - Hb 9.9 on 5/18     Assessment/ Plan: R BKA stump pain/ severe PAD/Possible cellulits + necrotic fingers- s/p AKA on 01/08/22 by Dr. CDonzetta Matters BC NGTD. Remains on vanc and meropenam.  Had "episode" on 5/24-   confusion/hypotension/fever-  now resolved-  CCM on board- would think he may transfer out of ICU ESRD - on HD TTS. HD Thursday which got put off to middle of the night-  next due tomorrow (Sat) via AVF.  Hyperkalemia - resolved.-- note that the 2.9 was at the end of HD so not reflective of steady state-  already got 30 meq this AM :(  Severe PAD - sp L TMA, multiple amputations of L hand-  VVS involved-  said if continues to have issues may need ligation of AVF CAD/ ICM / combined CHF - EF 40-45% DM2 - per pmd BP/ vol - BP's soft to normal. Way over dry weight per bed weights. UF as tolerated.  Will need new dry weight not post AKA.-- today weight seems more appropriate Anemia esrd - Hb drop to 7.4 post surgery-  then 6.9. Hold iron d/t potential infection.  Order ESA with next HD-  got 100.  Transfuse prn for Hgb<7-  needed on 5/24. MBD ckd - Ca in range, phos elevated. Cont velphoro as binder and IV vdra.  Nutrition - renal diet w/fluid restrictions. Alb 2.5 - give protein supplements  [facility] 01/11/2022,7:59 AM  LOS: 4 days

## 2022-01-11 NOTE — Progress Notes (Signed)
PROGRESS NOTE                                                                                                                                                                                                             Patient Demographics:    Edward [name], is a 51 y.o. male, DOB - 03-Jan-1971, SHF:[id number]  Outpatient Primary MD for the patient is [name], MD    LOS - 4  Admit date - 01/07/2022    No chief complaint on file.      Brief Narrative (HPI from H&P)    51 y.o. male, history of severe PAD s/p left transmetatarsal amputation, right BKA with necrotic stump, multiple amputation in left hand fingers, ESRD on TTS schedule for dialysis, DM type 2 insulin-dependent, dyslipidemia, CAD with MI in past, combined systolic and diastolic CHF EF 50%, orthostatic hypotension of chronic disease, AAA, recent Zio patch placement, who was brought in to the Cath Lab for elective bilateral lower extremity angiogram which was done by Dr. Donzetta Matters to evaluate ongoing right BKA stump pain and necrosis, postprocedure hospitalist team was requested to admit the patient as he may require further revision of his right lower extremity stump and underwent AKA this admission complicated by sepsis.   Subjective:   Patient in bed, appears comfortable, denies any headache, no fever, no chest pain or pressure, no shortness of breath , no abdominal pain. No new focal weakness.   Assessment  & Plan :   1.  Right BKA stump site discomfort, history of severe PAD, with a right BKA stump necrosis and post AKA Sepsis.  He has been seen by critical care and vascular surgery, on appropriate IV antibiotics with negative blood cultures, source of infection could be right leg BKA stump infection which eventually required revision into AKA.  Sepsis pathophysiology has resolved, continue IV antibiotics for a total of 7 days.  Monitor cultures closely.  For PAD  continue dual antiplatelet therapy along with statin and Zetia.  Vascular surgery is following his postop stump site appears clean.   2.  Severe PAD requiring right BKA and now right AKA, left transmetatarsal amputation, multiple amputations of left upper extremity fingers - for PAD DAPT, Zetia and statin for secondary prevention.  Vascular surgery on board.  Hand surgeon Dr. Fredna Dow to also evaluate patient on 01/11/2022  for his necrotic right hand digits and recently amputated left hand digit site evaluation.   3.  ESRD.  On TTS schedule, has left arm AV fistula, renal consulted.   4.  CAD with ischemic cardiomyopathy.  No acute issues supportive care, continue DAPT with statin for secondary prevention, has chronic orthostatic hypotension precluding the use of beta-blockers.   5.  Chronic combined systolic and diastolic CHF with EF 84%.  Currently compensated, fluid removal via HD, blood pressure too low to allow beta-blocker, ACE/ARB use.   6.  Dyslipidemia.  LDL above goal on max dose statin and Zetia added.   7.  Anemia of chronic disease with some perioperative blood loss related anemia along with heme dilution from IV fluids.  Due to ESRD.  On iron supplementation with HD treatments, received 1 unit of packed RBC early morning 01/10/2022, stable posttransfusion H&H no signs of active ongoing bleeding, on PPI as well.  8.  Hypokalemia in ESRD patient.  Gentle replacement.   9. DM type II.  Insulin-dependent.  Blood sugars running low's 7030 dose reduced continue sliding scale before every meal only.  Lab Results  Component Value Date   HGBA1C 6.5 (H) 01/07/2022   CBG (last 3)  Recent Labs    01/10/22 1626 01/10/22 2121 01/11/22 0647  GLUCAP 149* 80 114*          Condition - Extremely Guarded  Family Communication  :    Called Son Fannie Knee [phone] - left message 01/09/22 - 4.50 pm  Code Status :  Full  Consults  :  Renal, VVS, PCCM  PUD Prophylaxis : PPI   Procedures   :     Right AKA on 01/08/2022      Disposition Plan  :    Status is: Inpatient  DVT Prophylaxis  :    heparin injection 5,000 Units Start: 01/07/22 2200  Lab Results  Component Value Date   PLT 241 01/11/2022    Diet :  Diet Order             Diet renal/carb modified with fluid restriction Diet-HS Snack? Snack-yes; Fluid restriction: 1200 mL Fluid; Room service appropriate? Yes with Assist; Fluid consistency: Thin  Diet effective now                    Inpatient Medications  Scheduled Meds:  acetaminophen  1,000 mg Oral Q6H   aspirin EC  81 mg Oral Daily   atorvastatin  80 mg Oral Daily   Chlorhexidine Gluconate Cloth  6 each Topical Q0600   Chlorhexidine Gluconate Cloth  6 each Topical Q0600   Chlorhexidine Gluconate Cloth  6 each Topical Q0600   clopidogrel  75 mg Oral Daily   darbepoetin (ARANESP) injection - DIALYSIS  100 mcg Intravenous Q Thu-HD   doxercalciferol  11 mcg Intravenous Q T,Th,Sa-HD   ezetimibe  10 mg Oral Daily   gabapentin  100 mg Oral BID   heparin  5,000 Units Subcutaneous Q8H   insulin aspart protamine- aspart  15 Units Subcutaneous BID WC   linaclotide  72 mcg Oral QAC breakfast   pantoprazole  40 mg Oral BID   polyethylene glycol  17 g Oral Daily   sucroferric oxyhydroxide  1,000 mg Oral TID WC   Continuous Infusions:  sodium chloride     meropenem (MERREM) IV 500 mg (01/10/22 2001)   vancomycin 1,000 mg (01/11/22 0350)   PRN Meds:.bisacodyl, bismuth subsalicylate, fentaNYL (SUBLIMAZE) injection, hydrALAZINE, labetalol, naLOXone (NARCAN)  injection, ondansetron (ZOFRAN) IV, [DISCONTINUED] ondansetron **OR** ondansetron (ZOFRAN) IV, traMADol  Time Spent in minutes  30   Lala Lund M.D on 01/11/2022 at 8:27 AM  To page go to [email or web address]   [facility] -  Office  [phone]  See all Orders from today for further details    Objective:   Vitals:   01/11/22 0351 01/11/22 0403 01/11/22 0500 01/11/22 0600  BP:   (!) 112/96 (!) 133/100 130/78  Pulse:  84 88 79  Resp:  15 (!) 21 13  Temp: 98.1 F (36.7 C)     TempSrc: Oral     SpO2:  100% 97% 95%  Weight:    88.3 kg  Height:        Wt Readings from Last 3 Encounters:  01/11/22 88.3 kg  12/26/21 103.9 kg  11/28/21 104 kg     Intake/Output Summary (Last 24 hours) at 01/11/2022 0827 Last data filed at 01/11/2022 0600 Gross per 24 hour  Intake 650 ml  Output 3000 ml  Net -2350 ml      Physical Exam  Awake Alert, No new F.N deficits, Normal affect [place].AT,PERRAL Supple Neck, No JVD,   Symmetrical Chest wall movement, Good air movement bilaterally, CTAB RRR,No Gallops, Rubs or new Murmurs,  +ve B.Sounds, Abd Soft, No tenderness,   Right AKA stump site under bandage, right femoral cath site stable under bandage, left transmetatarsal amputation old, multiple fingers in the left hand missing, few necrosed R. Hand fingers, wearing a splint in the right wrist, left arm AV fistula present    Data Review:    CBC Recent Labs  Lab 01/09/22 0309 01/09/22 2245 01/10/22 0639 01/10/22 1201 01/11/22 0239  WBC 12.2* 11.2* 10.5 11.8* 9.9  HGB 7.4* 6.9* 7.5* 7.6* 7.9*  HCT 22.4* 22.0* 22.6* 22.6* 22.4*  PLT 225 210 201 214 241  MCV 99.6 100.9* 97.8 97.4 95.3  MCH 32.9 31.7 32.5 32.8 33.6  MCHC 33.0 31.4 33.2 33.6 35.3  RDW 15.9* 15.9* 17.0* 17.0* 16.8*  LYMPHSABS 0.6*  --  0.6*  --  0.8  MONOABS 1.0  --  0.9  --  0.6  EOSABS 0.2  --  0.1  --  0.3  BASOSABS 0.0  --  0.0  --  0.0    Electrolytes Recent Labs  Lab 01/07/22 1822 01/08/22 0543 01/08/22 0643 01/08/22 1314 01/09/22 0309 01/09/22 1803 01/09/22 2245 01/10/22 0639 01/11/22 0239  NA  --   --  132* 133* 134*  --   --  135 136  K  --   --  5.5* 4.0 4.2  --   --  4.0 2.9*  CL  --   --  86* 97* 96*  --   --  95* 97*  CO2  --   --  21*  --  24  --   --  22 27  GLUCOSE  --   --  102* 93 122*  --   --  88 94  BUN  --   --  55* 18 26*  --   --  39* 14  CREATININE 8.60*  --   9.74* 4.30* 6.00*  --   --  8.17* 3.31*  CALCIUM  --   --  9.4  --  8.6*  --   --  8.8* 8.6*  AST  --   --   --   --  35  --   --  48* 34  ALT  --   --   --   --  17  --   --  15 12  ALKPHOS  --   --   --   --  70  --   --  80 108  BILITOT  --   --   --   --  1.0  --   --  1.5* 0.7  ALBUMIN  --  2.9*  --   --  2.5*  --   --  2.5* 2.5*  MG  --   --   --   --  2.0  --   --  2.1 2.1  CRP  --   --  19.7*  --  21.8*  --   --  25.5* 27.8*  PROCALCITON  --   --  1.16  --  8.75  --   --  13.89 15.25  LATICACIDVEN  --   --   --   --   --  5.8* 1.6  --   --   HGBA1C 6.5*  --   --   --   --   --   --   --   --     ------------------------------------------------------------------------------------------------------------------ No results for input(s): CHOL, HDL, LDLCALC, TRIG, CHOLHDL, LDLDIRECT in the last 72 hours.   Lab Results  Component Value Date   HGBA1C 6.5 (H) 01/07/2022    No results for input(s): TSH, T4TOTAL, T3FREE, THYROIDAB in the last 72 hours.  Invalid input(s): FREET3 ------------------------------------------------------------------------------------------------------------------ ID Labs Recent Labs  Lab 01/08/22 0643 01/08/22 1314 01/09/22 0309 01/09/22 1803 01/09/22 2245 01/10/22 0639 01/10/22 1201 01/11/22 0239  WBC 10.8*  --  12.2*  --  11.2* 10.5 11.8* 9.9  PLT 278  --  225  --  210 201 214 241  CRP 19.7*  --  21.8*  --   --  25.5*  --  27.8*  PROCALCITON 1.16  --  8.75  --   --  13.89  --  15.25  LATICACIDVEN  --   --   --  5.8* 1.6  --   --   --   CREATININE 9.74* 4.30* 6.00*  --   --  8.17*  --  3.31*   Cardiac Enzymes No results for input(s): CKMB, TROPONINI, MYOGLOBIN in the last 168 hours.  Invalid input(s): CK  Micro Results Recent Results (from the past 240 hour(s))  MRSA Next Gen by PCR, Nasal     Status: None   Collection Time: 01/08/22  5:47 AM   Specimen: Nasal Mucosa; Nasal Swab  Result Value Ref Range Status   MRSA by PCR Next Gen NOT  DETECTED NOT DETECTED Final    Comment: (NOTE) The GeneXpert MRSA Assay (FDA approved for NASAL specimens only), is one component of a comprehensive MRSA colonization surveillance program. It is not intended to diagnose MRSA infection nor to guide or monitor treatment for MRSA infections. Test performance is not FDA approved in patients less than 34 years old. Performed at [facility], [place] [address] [place], [place] [address]   Culture, blood (Routine X 2) w Reflex to ID Panel     Status: None (Preliminary result)   Collection Time: 01/08/22  6:44 AM   Specimen: BLOOD RIGHT WRIST  Result Value Ref Range Status   Specimen Description BLOOD RIGHT WRIST  Final   Special Requests   Final    BOTTLES DRAWN AEROBIC AND ANAEROBIC Blood Culture adequate volume   Culture   Final    NO GROWTH 3 DAYS Performed at [facility], [place] [address], [place], [place] [address]    Report Status PENDING  Incomplete  Culture, blood (Routine X 2) w Reflex to ID Panel     Status: None (Preliminary result)   Collection Time: 01/08/22  6:52 AM   Specimen: BLOOD RIGHT FOREARM  Result Value Ref Range Status   Specimen Description BLOOD RIGHT FOREARM  Final   Special Requests   Final    BOTTLES DRAWN AEROBIC AND ANAEROBIC Blood Culture adequate volume   Culture   Final    NO GROWTH 3 DAYS Performed at [facility], [place] [address], [place], [place] [address]    Report Status PENDING  Incomplete  MRSA Next Gen by PCR, Nasal     Status: None   Collection Time: 01/09/22  6:03 PM   Specimen: Nasal Mucosa; Nasal Swab  Result Value Ref Range Status   MRSA by PCR Next Gen NOT DETECTED NOT DETECTED Final    Comment: (NOTE) The GeneXpert MRSA Assay (FDA approved for NASAL specimens only), is one component of a comprehensive MRSA colonization surveillance program. It is not intended to diagnose MRSA infection nor to guide or monitor treatment for MRSA infections. Test performance is not FDA  approved in patients less than 51 years old. Performed at [facility], [place] [address] [place],  [address]     Radiology Reports PERIPHERAL VASCULAR CATHETERIZATION  Result Date: 01/07/2022 Images from the original result were not included. Patient name: Edward [name] MRN: [id number] DOB: 1971-04-01 Sex: male 01/07/2022 Pre-operative Diagnosis: End-stage renal disease, necrotic bilateral fingers, necrotic right below-knee amputation, left lower extremity wound Post-operative diagnosis:  Same Surgeon:  Eda Paschal. Donzetta Matters, MD Procedure Performed: 1.  Ultrasound-guided cannulation right common femoral artery 2.  Arch aortogram 3.  Selection of bilateral axillary arteries and upper bilateral extremity angiography 4.  Bilateral lower extremity angiography Indications: 51 year old male with history of right below-knee amputation which is now healing.  He also has amputations of the left fingers and now with new necrosis of fingers on the right.  Wound on the left foot is healing but the right below-knee amputation is necrotic.  He is now indicated for angiography and possible intervention of all extremities. Findings: The arch of his aorta appears to be a type I arch.  The right upper extremity there is a small lesion in the brachial artery just after the takeoff of a high radial artery.  He does not have any ulnar artery flow to his right hand that is appreciable.  The radial artery has very sluggish flow down to the level of the wrist could not get any flow into the hand.  The interosseous artery flows toe about the level of the wrist and then stops.  No intervention was undertaken there.  On the left side there is no flow-limiting stenosis throughout the upper extremity he does have radial artery dominance which goes to the level of the wrist at least but is difficult given the fistula.  If there is persistent wounds of the left hand we may need to consider ligation of the left arm fistula given that  I do not think he would be a good candidate for any extensive revascularization of his left upper extremity.  The aorta in the abdomen is free of flow-limiting stenosis.  The SMA is patent.  Bilateral renal arteries consistent with dialysis.  Bilateral common iliac arteries are patent and there is disease in the bilateral hypogastric arteries but  they are both patent.  On the right side the common femoral artery is patent with profunda flow in the SFA is patent down to the level of above the knee where there is no flow into the below-knee area consistent with necrosis of the below-knee amputation.  On the left side the common femoral and SFA are patent.  There is a 50% stenosis of the popliteal artery and then appears to have dominant flow via the peroneal artery to a transmetatarsal amputation on the left.  No intervention was undertaken. Plan will be for right above-knee amputation tomorrow in the OR.  Procedure:  The patient was identified in the holding area and taken to room 8.  The patient was then placed supine on the table and prepped and draped in the usual sterile fashion.  A time out was called.  Ultrasound was used to evaluate the right common femoral artery.  The area was thickened but we are able to anesthetize this area to cannulate the artery with micropuncture needle followed by wire and sheath.  Images saved department record.  We placed a 5 French sheath over a Bentson wire followed by pigtail catheter to the aortic arch and patient is given 3000's of heparin.  We performed arch angiography then selected the bilateral subclavian and axillary arteries and performed bilateral upper extremity angiography without intervention.  We then put a pigtail in the abdominal aorta performed bilateral lower extremity angiography after aortic angiography and there is no intervention there either.  Plan will be for right above-knee amputation tomorrow in the OR.  Catheter was removed over wire.  Sheath will be  pulled in postoperative holding.  He tolerated procedure without any complication. Contrast: 170cc Brandon C. Donzetta Matters, MD [facility] Office: [phone] Pager: [phone]   DG Chest Port 1 View  Result Date: 01/09/2022 CLINICAL DATA:  Shortness of breath EXAM: PORTABLE CHEST 1 VIEW COMPARISON:  Chest x-ray dated Jan 08, 2022 FINDINGS: Unchanged cardiomegaly. Mild bilateral heterogeneous opacities. No large pleural effusion or pneumothorax. IMPRESSION: 1. Mild bilateral heterogeneous opacities, likely due to pulmonary edema. 2. Unchanged cardiomegaly. Electronically Signed   By: Yetta Glassman M.D.   On: 01/09/2022 13:54   DG Chest Port 1 View  Result Date: 01/08/2022 CLINICAL DATA:  Shortness of breath. EXAM: PORTABLE CHEST 1 VIEW COMPARISON:  09/06/2021 FINDINGS: 0621 hours. The cardio pericardial silhouette is enlarged. The lungs are clear without focal pneumonia, edema, pneumothorax or pleural effusion. The visualized bony structures of the thorax are unremarkable. Telemetry leads overlie the chest. IMPRESSION: Stable enlargement of the cardiopericardial silhouette. Otherwise no acute findings. Electronically Signed   By: Misty Stanley M.D.   On: 01/08/2022 06:38

## 2022-01-12 ENCOUNTER — Inpatient Hospital Stay (HOSPITAL_COMMUNITY): Payer: Medicare Other | Admitting: Anesthesiology

## 2022-01-12 ENCOUNTER — Other Ambulatory Visit: Payer: Self-pay

## 2022-01-12 ENCOUNTER — Encounter (HOSPITAL_COMMUNITY): Payer: Self-pay | Admitting: Internal Medicine

## 2022-01-12 ENCOUNTER — Encounter (HOSPITAL_COMMUNITY)
Admission: AD | Disposition: A | Discharge status: Home Health | Payer: Self-pay | Source: Home / Self Care | Attending: Internal Medicine

## 2022-01-12 DIAGNOSIS — I2510 Atherosclerotic heart disease of native coronary artery without angina pectoris: Secondary | ICD-10-CM

## 2022-01-12 DIAGNOSIS — E1152 Type 2 diabetes mellitus with diabetic peripheral angiopathy with gangrene: Secondary | ICD-10-CM

## 2022-01-12 DIAGNOSIS — I252 Old myocardial infarction: Secondary | ICD-10-CM

## 2022-01-12 DIAGNOSIS — I739 Peripheral vascular disease, unspecified: Secondary | ICD-10-CM | POA: Diagnosis not present

## 2022-01-12 DIAGNOSIS — Z794 Long term (current) use of insulin: Secondary | ICD-10-CM

## 2022-01-12 HISTORY — PX: AMPUTATION: SHX166

## 2022-01-12 LAB — CBC WITH DIFFERENTIAL/PLATELET
Abs Immature Granulocytes: 0.06 10*3/uL (ref 0.00–0.07)
Basophils Absolute: 0 10*3/uL (ref 0.0–0.1)
Basophils Relative: 0 %
Eosinophils Absolute: 0.3 10*3/uL (ref 0.0–0.5)
Eosinophils Relative: 4 %
HCT: 24.7 % — ABNORMAL LOW (ref 39.0–52.0)
Hemoglobin: 7.8 g/dL — ABNORMAL LOW (ref 13.0–17.0)
Immature Granulocytes: 1 %
Lymphocytes Relative: 9 %
Lymphs Abs: 0.7 10*3/uL (ref 0.7–4.0)
MCH: 31.1 pg (ref 26.0–34.0)
MCHC: 31.6 g/dL (ref 30.0–36.0)
MCV: 98.4 fL (ref 80.0–100.0)
Monocytes Absolute: 0.7 10*3/uL (ref 0.1–1.0)
Monocytes Relative: 9 %
Neutro Abs: 6.3 10*3/uL (ref 1.7–7.7)
Neutrophils Relative %: 77 %
Platelets: 263 10*3/uL (ref 150–400)
RBC: 2.51 MIL/uL — ABNORMAL LOW (ref 4.22–5.81)
RDW: 16.4 % — ABNORMAL HIGH (ref 11.5–15.5)
WBC: 8.1 10*3/uL (ref 4.0–10.5)
nRBC: 0.2 % (ref 0.0–0.2)

## 2022-01-12 LAB — COMPREHENSIVE METABOLIC PANEL
ALT: 8 U/L (ref 0–44)
AST: 24 U/L (ref 15–41)
Albumin: 2.4 g/dL — ABNORMAL LOW (ref 3.5–5.0)
Alkaline Phosphatase: 112 U/L (ref 38–126)
Anion gap: 13 (ref 5–15)
BUN: 28 mg/dL — ABNORMAL HIGH (ref 6–20)
CO2: 24 mmol/L (ref 22–32)
Calcium: 8.6 mg/dL — ABNORMAL LOW (ref 8.9–10.3)
Chloride: 95 mmol/L — ABNORMAL LOW (ref 98–111)
Creatinine, Ser: 5.81 mg/dL — ABNORMAL HIGH (ref 0.61–1.24)
GFR, Estimated: 11 mL/min — ABNORMAL LOW (ref >60)
Glucose, Bld: 153 mg/dL — ABNORMAL HIGH (ref 70–99)
Potassium: 3.8 mmol/L (ref 3.5–5.1)
Sodium: 132 mmol/L — ABNORMAL LOW (ref 135–145)
Total Bilirubin: 0.8 mg/dL (ref 0.3–1.2)
Total Protein: 6.3 g/dL — ABNORMAL LOW (ref 6.5–8.1)

## 2022-01-12 LAB — GLUCOSE, CAPILLARY
Glucose-Capillary: 157 mg/dL — ABNORMAL HIGH (ref 70–99)
Glucose-Capillary: 146 mg/dL — ABNORMAL HIGH (ref 70–99)
Glucose-Capillary: 138 mg/dL — ABNORMAL HIGH (ref 70–99)
Glucose-Capillary: 163 mg/dL — ABNORMAL HIGH (ref 70–99)
Glucose-Capillary: 196 mg/dL — ABNORMAL HIGH (ref 70–99)
Glucose-Capillary: 268 mg/dL — ABNORMAL HIGH (ref 70–99)

## 2022-01-12 LAB — PROCALCITONIN: Procalcitonin: 11.14 ng/mL

## 2022-01-12 LAB — C-REACTIVE PROTEIN: CRP: 21.3 mg/dL — ABNORMAL HIGH (ref <1.0)

## 2022-01-12 LAB — MAGNESIUM: Magnesium: 2 mg/dL (ref 1.7–2.4)

## 2022-01-12 SURGERY — AMPUTATION DIGIT
Anesthesia: General | Laterality: Bilateral

## 2022-01-12 MED ORDER — DEXAMETHASONE SODIUM PHOSPHATE 10 MG/ML IJ SOLN
INTRAMUSCULAR | Status: DC | PRN
  Administered 2022-01-12: 10 mg via INTRAVENOUS

## 2022-01-12 MED ORDER — CHLORHEXIDINE GLUCONATE 0.12 % MT SOLN: 15.0000 mL | Freq: Once | OROMUCOSAL | Status: AC

## 2022-01-12 MED ORDER — BUPIVACAINE HCL 0.25 % IJ SOLN
INTRAMUSCULAR | Status: DC | PRN
  Administered 2022-01-12: 18 mL

## 2022-01-12 MED ORDER — PHENYLEPHRINE 80 MCG/ML (10ML) SYRINGE FOR IV PUSH (FOR BLOOD PRESSURE SUPPORT)
PREFILLED_SYRINGE | INTRAVENOUS | Status: DC | PRN
  Administered 2022-01-12: 160 ug via INTRAVENOUS
  Administered 2022-01-12: 80 ug via INTRAVENOUS
  Administered 2022-01-12 (×2): 160 ug via INTRAVENOUS
  Administered 2022-01-12: 80 ug via INTRAVENOUS
  Administered 2022-01-12: 160 ug via INTRAVENOUS

## 2022-01-12 MED ORDER — SUCCINYLCHOLINE CHLORIDE 200 MG/10ML IV SOSY
PREFILLED_SYRINGE | INTRAVENOUS | Status: AC
  Filled 2022-01-12: qty 10

## 2022-01-12 MED ORDER — LIDOCAINE 2% (20 MG/ML) 5 ML SYRINGE
INTRAMUSCULAR | Status: DC | PRN
  Administered 2022-01-12: 100 mg via INTRAVENOUS

## 2022-01-12 MED ORDER — OXYCODONE HCL 5 MG PO TABS
5.0000 mg | ORAL_TABLET | ORAL | Status: DC | PRN
  Administered 2022-01-12 – 2022-01-14 (×5): 10 mg via ORAL
  Administered 2022-01-14: 5 mg via ORAL
  Administered 2022-01-15: 10 mg via ORAL
  Administered 2022-01-16: 5 mg via ORAL
  Administered 2022-01-16: 10 mg via ORAL
  Filled 2022-01-12 (×8): qty 2
  Filled 2022-01-12: qty 1
  Filled 2022-01-12: qty 2

## 2022-01-12 MED ORDER — BUPIVACAINE HCL (PF) 0.25 % IJ SOLN
INTRAMUSCULAR | Status: AC
  Filled 2022-01-12: qty 30

## 2022-01-12 MED ORDER — MIDAZOLAM HCL 5 MG/5ML IJ SOLN
INTRAMUSCULAR | Status: DC | PRN
  Administered 2022-01-12: 2 mg via INTRAVENOUS

## 2022-01-12 MED ORDER — ONDANSETRON HCL 4 MG/2ML IJ SOLN
INTRAMUSCULAR | Status: DC | PRN
Start: 2022-01-12 — End: 2022-01-12
  Administered 2022-01-12: 4 mg via INTRAVENOUS

## 2022-01-12 MED ORDER — PROPOFOL 10 MG/ML IV BOLUS
INTRAVENOUS | Status: AC
  Filled 2022-01-12: qty 20

## 2022-01-12 MED ORDER — LIDOCAINE 2% (20 MG/ML) 5 ML SYRINGE
INTRAMUSCULAR | Status: AC
  Filled 2022-01-12: qty 10

## 2022-01-12 MED ORDER — VASOPRESSIN 20 UNIT/ML IV SOLN
INTRAVENOUS | Status: AC
  Filled 2022-01-12: qty 1

## 2022-01-12 MED ORDER — EPHEDRINE SULFATE-NACL 50-0.9 MG/10ML-% IV SOSY
PREFILLED_SYRINGE | INTRAVENOUS | Status: DC | PRN
  Administered 2022-01-12: 10 mg via INTRAVENOUS

## 2022-01-12 MED ORDER — SODIUM CHLORIDE 0.9 % IV SOLN: INTRAVENOUS | Status: DC

## 2022-01-12 MED ORDER — CHLORHEXIDINE GLUCONATE 0.12 % MT SOLN
OROMUCOSAL | Status: AC
  Administered 2022-01-12: 15 mL via OROMUCOSAL
  Filled 2022-01-12: qty 15

## 2022-01-12 MED ORDER — INSULIN ASPART 100 UNIT/ML IJ SOLN
0.0000 [IU] | Freq: Three times a day (TID) | INTRAMUSCULAR | Status: DC
  Administered 2022-01-13 (×2): 4 [IU] via SUBCUTANEOUS
  Administered 2022-01-13: 3 [IU] via SUBCUTANEOUS

## 2022-01-12 MED ORDER — VASOPRESSIN 20 UNIT/ML IV SOLN
INTRAVENOUS | Status: DC | PRN
  Administered 2022-01-12 (×2): 1 [IU] via INTRAVENOUS

## 2022-01-12 MED ORDER — MIDAZOLAM HCL 2 MG/2ML IJ SOLN
INTRAMUSCULAR | Status: AC
  Filled 2022-01-12: qty 2

## 2022-01-12 MED ORDER — VANCOMYCIN HCL 750 MG/150ML IV SOLN
750.0000 mg | Freq: Once | INTRAVENOUS | Status: AC
  Administered 2022-01-12: 750 mg via INTRAVENOUS
  Filled 2022-01-12: qty 150

## 2022-01-12 MED ORDER — ORAL CARE MOUTH RINSE: 15.0000 mL | Freq: Once | OROMUCOSAL | Status: AC

## 2022-01-12 MED ORDER — ROCURONIUM BROMIDE 10 MG/ML (PF) SYRINGE
PREFILLED_SYRINGE | INTRAVENOUS | Status: AC
  Filled 2022-01-12: qty 10

## 2022-01-12 MED ORDER — FENTANYL CITRATE (PF) 250 MCG/5ML IJ SOLN
INTRAMUSCULAR | Status: AC
  Filled 2022-01-12: qty 5

## 2022-01-12 MED ORDER — PHENYLEPHRINE 80 MCG/ML (10ML) SYRINGE FOR IV PUSH (FOR BLOOD PRESSURE SUPPORT)
PREFILLED_SYRINGE | INTRAVENOUS | Status: AC
  Filled 2022-01-12: qty 10

## 2022-01-12 MED ORDER — FENTANYL CITRATE (PF) 100 MCG/2ML IJ SOLN
INTRAMUSCULAR | Status: DC | PRN
  Administered 2022-01-12 (×3): 50 ug via INTRAVENOUS

## 2022-01-12 MED ORDER — DEXAMETHASONE SODIUM PHOSPHATE 10 MG/ML IJ SOLN
INTRAMUSCULAR | Status: AC
  Filled 2022-01-12: qty 1

## 2022-01-12 MED ORDER — ONDANSETRON HCL 4 MG/2ML IJ SOLN
INTRAMUSCULAR | Status: AC
  Filled 2022-01-12: qty 2

## 2022-01-12 MED ORDER — INSULIN ASPART 100 UNIT/ML IJ SOLN: 0.0000 [IU] | INTRAMUSCULAR | Status: DC | PRN

## 2022-01-12 MED ORDER — PROPOFOL 10 MG/ML IV BOLUS
INTRAVENOUS | Status: DC | PRN
Start: 2022-01-12 — End: 2022-01-12
  Administered 2022-01-12: 110 mg via INTRAVENOUS

## 2022-01-12 SURGICAL SUPPLY — 32 items
BLADE SURG 15 STRL LF DISP TIS (BLADE) IMPLANT
BLADE SURG 15 STRL SS (BLADE) ×4
BNDG CMPR 5X2 CHSV 1 LYR STRL (GAUZE/BANDAGES/DRESSINGS) ×1
BNDG CMPR 9X4 STRL LF SNTH (GAUZE/BANDAGES/DRESSINGS) ×1
BNDG COHESIVE 1X5 TAN STRL LF (GAUZE/BANDAGES/DRESSINGS) ×1 IMPLANT
BNDG COHESIVE 2X5 TAN ST LF (GAUZE/BANDAGES/DRESSINGS) ×1 IMPLANT
BNDG COHESIVE 6X5 TAN NS LF (GAUZE/BANDAGES/DRESSINGS) ×1 IMPLANT
BNDG ESMARK 4X9 LF (GAUZE/BANDAGES/DRESSINGS) ×2 IMPLANT
BNDG GAUZE ELAST 4 BULKY (GAUZE/BANDAGES/DRESSINGS) ×2 IMPLANT
CORD BIPOLAR FORCEPS 12FT (ELECTRODE) ×3 IMPLANT
CUFF TOURN SGL QUICK 18X4 (TOURNIQUET CUFF) ×4 IMPLANT
DRSG XEROFORM 1X8 (GAUZE/BANDAGES/DRESSINGS) ×2 IMPLANT
GAUZE SPONGE 4X4 12PLY STRL (GAUZE/BANDAGES/DRESSINGS) ×2 IMPLANT
GAUZE XEROFORM 1X8 LF (GAUZE/BANDAGES/DRESSINGS) ×2 IMPLANT
GLOVE BIO SURGEON STRL SZ7.5 (GLOVE) ×2 IMPLANT
GLOVE BIOGEL PI IND STRL 8 (GLOVE) ×2 IMPLANT
GLOVE BIOGEL PI INDICATOR 8 (GLOVE) ×1
GOWN STRL REUS W/ TWL XL LVL3 (GOWN DISPOSABLE) ×2 IMPLANT
GOWN STRL REUS W/TWL XL LVL3 (GOWN DISPOSABLE) ×4
KIT BASIN OR (CUSTOM PROCEDURE TRAY) ×2 IMPLANT
KIT TURNOVER KIT B (KITS) ×2 IMPLANT
NDL HYPO 25GX1X1/2 BEV (NEEDLE) IMPLANT
NEEDLE HYPO 25GX1X1/2 BEV (NEEDLE) ×2 IMPLANT
NS IRRIG 1000ML POUR BTL (IV SOLUTION) ×3 IMPLANT
PACK ORTHO EXTREMITY (CUSTOM PROCEDURE TRAY) ×2 IMPLANT
PAD ARMBOARD 7.5X6 YLW CONV (MISCELLANEOUS) ×3 IMPLANT
SPECIMEN JAR SMALL (MISCELLANEOUS) ×1 IMPLANT
SUT MON AB 5-0 PS2 18 (SUTURE) ×4 IMPLANT
SUT VICRYL 4-0 PS2 18IN ABS (SUTURE) ×1 IMPLANT
SYR CONTROL 10ML LL (SYRINGE) ×1 IMPLANT
TOWEL GREEN STERILE (TOWEL DISPOSABLE) ×2 IMPLANT
UNDERPAD 30X36 HEAVY ABSORB (UNDERPADS AND DIAPERS) ×3 IMPLANT

## 2022-01-12 NOTE — Progress Notes (Signed)
PROGRESS NOTE                                                                                                                                                                                                             Patient Demographics:    Edward [name], is a 51 y.o. male, DOB - 03/14/1971, GGY:[id number]  Outpatient Primary MD for the patient is [name], MD    LOS - 5  Admit date - 01/07/2022    No chief complaint on file.      Brief Narrative (HPI from H&P)    51 y.o. male, history of severe PAD s/p left transmetatarsal amputation, right BKA with necrotic stump, multiple amputation in left hand fingers, ESRD on TTS schedule for dialysis, DM type 2 insulin-dependent, dyslipidemia, CAD with MI in past, combined systolic and diastolic CHF EF 03%, orthostatic hypotension of chronic disease, AAA, recent Zio patch placement, who was brought in to the Cath Lab for elective bilateral lower extremity angiogram which was done by Dr. Donzetta Matters to evaluate ongoing right BKA stump pain and necrosis, postprocedure hospitalist team was requested to admit the patient as he may require further revision of his right lower extremity stump and underwent AKA this admission complicated by sepsis.   Subjective:   Patient in bed, appears comfortable, denies any headache, no fever, no chest pain or pressure, no shortness of breath , no abdominal pain. No new focal weakness.   Assessment  & Plan :   1.  Right BKA stump site discomfort, history of severe PAD, with a right BKA stump necrosis and post AKA Sepsis.  He has been seen by critical care and vascular surgery, on appropriate IV antibiotics with negative blood cultures, source of infection could be right leg BKA stump infection which eventually required revision into AKA.  Sepsis pathophysiology has resolved, continue IV antibiotics for a total of 7 days.  Monitor cultures closely.  For PAD  continue dual antiplatelet therapy along with statin and Zetia.  Vascular surgery is following his postop stump site appears clean.   2.  Severe PAD requiring right BKA and now right AKA, left transmetatarsal amputation, multiple amputations of left upper extremity fingers - for PAD DAPT, Zetia and statin for secondary prevention.  Vascular surgery on board.  Hand surgeon Dr. Fredna Dow has scheduled him for surgical amputation  and debridement of necrotic right hand digits and recently amputated left hand digits, surgery due on 01/12/2022.   3.  ESRD.  On TTS schedule, has left arm AV fistula, renal consulted.   4.  CAD with ischemic cardiomyopathy.  No acute issues supportive care, continue DAPT with statin for secondary prevention, has chronic orthostatic hypotension precluding the use of beta-blockers.   5.  Chronic combined systolic and diastolic CHF with EF 60%.  Currently compensated, fluid removal via HD, blood pressure too low to allow beta-blocker, ACE/ARB use.   6.  Dyslipidemia.  LDL above goal on max dose statin and Zetia added.   7.  Anemia of chronic disease with some perioperative blood loss related anemia along with heme dilution from IV fluids.  Due to ESRD.  On iron supplementation with HD treatments, received 1 unit of packed RBC early morning 01/10/2022, stable posttransfusion H&H no signs of active ongoing bleeding, on PPI as well.  He may require another unit of transfusion on 01/13/2022 based on his surgical blood loss on 01/12/2022.  8.  Hypokalemia in ESRD patient.  Gentle replacement.   9. DM type II.  Insulin-dependent.  Blood sugars running low's 7030 dose reduced continue sliding scale before every meal only.  Lab Results  Component Value Date   HGBA1C 6.5 (H) 01/07/2022   CBG (last 3)  Recent Labs    01/11/22 1628 01/11/22 2119 01/12/22 0652  GLUCAP 143* 78 157*          Condition - Extremely Guarded  Family Communication  :    Called Son Fannie Knee [phone]  - left message 01/09/22 - 4.50 pm  Code Status :  Full  Consults  :  Renal, VVS, PCCM  PUD Prophylaxis : PPI   Procedures  :     Due for bilateral multiple finger amputation by Dr. Fredna Dow hand surgeon on 01/12/2022.    Right AKA on 01/08/2022      Disposition Plan  :    Status is: Inpatient  DVT Prophylaxis  :    heparin injection 5,000 Units Start: 01/07/22 2200  Lab Results  Component Value Date   PLT 263 01/12/2022    Diet :  Diet Order             Diet renal with fluid restriction Fluid restriction: 1200 mL Fluid; Room service appropriate? Yes; Fluid consistency: Thin  Diet effective now                    Inpatient Medications  Scheduled Meds:  [MAR Hold] aspirin EC  81 mg Oral Daily   [MAR Hold] atorvastatin  80 mg Oral Daily   [MAR Hold] Chlorhexidine Gluconate Cloth  6 each Topical Q0600   [MAR Hold] Chlorhexidine Gluconate Cloth  6 each Topical Q0600   [MAR Hold] Chlorhexidine Gluconate Cloth  6 each Topical Q0600   [MAR Hold] clopidogrel  75 mg Oral Daily   [MAR Hold] darbepoetin (ARANESP) injection - DIALYSIS  100 mcg Intravenous Q Thu-HD   [MAR Hold] doxercalciferol  11 mcg Intravenous Q T,Th,Sa-HD   [MAR Hold] ezetimibe  10 mg Oral Daily   [MAR Hold] gabapentin  100 mg Oral BID   [MAR Hold] heparin  5,000 Units Subcutaneous Q8H   [MAR Hold] insulin aspart protamine- aspart  10 Units Subcutaneous BID WC   [MAR Hold] linaclotide  72 mcg Oral QAC breakfast   [MAR Hold] pantoprazole  40 mg Oral BID   [MAR Hold] polyethylene glycol  17 g Oral Daily   povidone-iodine  2 application. Topical Once   [MAR Hold] sucroferric oxyhydroxide  1,000 mg Oral TID WC   Continuous Infusions:  [MAR Hold] sodium chloride     [MAR Hold] sodium chloride     sodium chloride 250 mL (01/11/22 2340)   [MAR Hold] meropenem (MERREM) IV 500 mg (01/11/22 2341)   [MAR Hold] vancomycin Stopped (01/11/22 0451)   PRN Meds:.[MAR Hold] sodium chloride, [MAR Hold] sodium  chloride, [MAR Hold] alteplase, [MAR Hold] bisacodyl, [MAR Hold] bismuth subsalicylate, [MAR Hold] fentaNYL (SUBLIMAZE) injection, [MAR Hold] heparin, [MAR Hold] heparin, [MAR Hold] hydrALAZINE, [MAR Hold] labetalol, [MAR Hold] lidocaine (PF), [MAR Hold] lidocaine-prilocaine, [MAR Hold] naLOXone (NARCAN)  injection, [MAR Hold] ondansetron (ZOFRAN) IV, [DISCONTINUED] ondansetron **OR** [MAR Hold] ondansetron (ZOFRAN) IV, [MAR Hold] pentafluoroprop-tetrafluoroeth, [MAR Hold] traMADol  Time Spent in minutes  30   Lala Lund M.D on 01/12/2022 at 8:39 AM  To page go to [email or web address]   [facility] -  Office  [phone]  See all Orders from today for further details    Objective:   Vitals:   01/11/22 1619 01/11/22 2007 01/12/22 0023 01/12/22 0506  BP: 113/61 98/62 125/80 117/76  Pulse: 72 72 78 78  Resp: '16 16 16 16  '$ Temp: 98.4 F (36.9 C) 98.4 F (36.9 C) 98.2 F (36.8 C) (!) 97.3 F (36.3 C)  TempSrc: Oral Oral Oral Oral  SpO2: 98%  100% 100%  Weight:      Height:        Wt Readings from Last 3 Encounters:  01/11/22 88.3 kg  12/26/21 103.9 kg  11/28/21 104 kg     Intake/Output Summary (Last 24 hours) at 01/12/2022 0839 Last data filed at 01/12/2022 0300 Gross per 24 hour  Intake 653.13 ml  Output --  Net 653.13 ml      Physical Exam  Awake Alert, No new F.N deficits, Normal affect [place].AT,PERRAL Supple Neck, No JVD,   Symmetrical Chest wall movement, Good air movement bilaterally, CTAB RRR,No Gallops, Rubs or new Murmurs,  +ve B.Sounds, Abd Soft, No tenderness,    Right AKA stump site under bandage, right femoral cath site stable under bandage, left transmetatarsal amputation old, multiple fingers in the left hand missing, few necrosed R. Hand fingers, wearing a splint in the right wrist, left arm AV fistula present    Data Review:    CBC Recent Labs  Lab 01/09/22 0309 01/09/22 2245 01/10/22 0639 01/10/22 1201 01/11/22 0239 01/11/22 1755  01/12/22 0041  WBC 12.2*   < > 10.5 11.8* 9.9 9.0 8.1  HGB 7.4*   < > 7.5* 7.6* 7.9* 8.7* 7.8*  HCT 22.4*   < > 22.6* 22.6* 22.4* 26.2* 24.7*  PLT 225   < > 201 214 241 281 263  MCV 99.6   < > 97.8 97.4 95.3 97.8 98.4  MCH 32.9   < > 32.5 32.8 33.6 32.5 31.1  MCHC 33.0   < > 33.2 33.6 35.3 33.2 31.6  RDW 15.9*   < > 17.0* 17.0* 16.8* 16.7* 16.4*  LYMPHSABS 0.6*  --  0.6*  --  0.8  --  0.7  MONOABS 1.0  --  0.9  --  0.6  --  0.7  EOSABS 0.2  --  0.1  --  0.3  --  0.3  BASOSABS 0.0  --  0.0  --  0.0  --  0.0   < > = values in this interval not displayed.  Electrolytes Recent Labs  Lab 01/07/22 1822 01/08/22 0543 01/08/22 0643 01/08/22 1314 01/09/22 0309 01/09/22 1803 01/09/22 2245 01/10/22 0639 01/11/22 0239 01/11/22 1755 01/12/22 0041  NA  --   --  132*   < > 134*  --   --  135 136 135 132*  K  --   --  5.5*   < > 4.2  --   --  4.0 2.9* 3.9 3.8  CL  --   --  86*   < > 96*  --   --  95* 97* 95* 95*  CO2  --    < > 21*  --  24  --   --  '22 27 25 24  '$ GLUCOSE  --   --  102*   < > 122*  --   --  88 94 135* 153*  BUN  --   --  55*   < > 26*  --   --  39* 14 23* 28*  CREATININE 8.60*  --  9.74*   < > 6.00*  --   --  8.17* 3.31* 5.19* 5.81*  CALCIUM  --    < > 9.4  --  8.6*  --   --  8.8* 8.6* 9.1 8.6*  AST  --   --   --   --  35  --   --  48* 34  --  24  ALT  --   --   --   --  17  --   --  15 12  --  8  ALKPHOS  --   --   --   --  70  --   --  80 108  --  112  BILITOT  --   --   --   --  1.0  --   --  1.5* 0.7  --  0.8  ALBUMIN  --    < >  --   --  2.5*  --   --  2.5* 2.5* 2.7* 2.4*  MG  --   --   --   --  2.0  --   --  2.1 2.1  --  2.0  CRP  --   --  19.7*  --  21.8*  --   --  25.5* 27.8*  --  21.3*  PROCALCITON  --   --  1.16  --  8.75  --   --  13.89 15.25  --  11.14  LATICACIDVEN  --   --   --   --   --  5.8* 1.6  --   --   --   --   HGBA1C 6.5*  --   --   --   --   --   --   --   --   --   --    < > = values in this interval not displayed.     ------------------------------------------------------------------------------------------------------------------ No results for input(s): CHOL, HDL, LDLCALC, TRIG, CHOLHDL, LDLDIRECT in the last 72 hours.   Lab Results  Component Value Date   HGBA1C 6.5 (H) 01/07/2022    No results for input(s): TSH, T4TOTAL, T3FREE, THYROIDAB in the last 72 hours.  Invalid input(s): FREET3 ------------------------------------------------------------------------------------------------------------------ ID Labs Recent Labs  Lab 01/08/22 0643 01/08/22 1314 01/09/22 0309 01/09/22 1803 01/09/22 2245 01/10/22 0639 01/10/22 1201 01/11/22 0239 01/11/22 1755 01/12/22 0041  WBC 10.8*  --  12.2*  --  11.2* 10.5 11.8* 9.9 9.0 8.1  PLT 278  --  225  --  210 201 214 241 281 263  CRP 19.7*  --  21.8*  --   --  25.5*  --  27.8*  --  21.3*  PROCALCITON 1.16  --  8.75  --   --  13.89  --  15.25  --  11.14  LATICACIDVEN  --   --   --  5.8* 1.6  --   --   --   --   --   CREATININE 9.74*   < > 6.00*  --   --  8.17*  --  3.31* 5.19* 5.81*   < > = values in this interval not displayed.   Cardiac Enzymes No results for input(s): CKMB, TROPONINI, MYOGLOBIN in the last 168 hours.  Invalid input(s): CK  Radiology Reports DG Chest Port 1 View  Result Date: 01/09/2022 CLINICAL DATA:  Shortness of breath EXAM: PORTABLE CHEST 1 VIEW COMPARISON:  Chest x-ray dated Jan 08, 2022 FINDINGS: Unchanged cardiomegaly. Mild bilateral heterogeneous opacities. No large pleural effusion or pneumothorax. IMPRESSION: 1. Mild bilateral heterogeneous opacities, likely due to pulmonary edema. 2. Unchanged cardiomegaly. Electronically Signed   By: Yetta Glassman M.D.   On: 01/09/2022 13:54

## 2022-01-12 NOTE — Progress Notes (Signed)
Patient ID: Edward [name], male   DOB: 1970/09/08, 51 y.o.   MRN: [id number] Spoke to patient and he is agreeable to a 2.5hr dialysis with 1L UF and no heparin. Orders entered. He and his son were concerned about surgical site bleeding and changes in mentation that he had at last HD.  [name] MD [facility]. Office # [phone] Pager # [phone] 3:20 PM

## 2022-01-12 NOTE — Interval H&P Note (Signed)
History and Physical Interval Note:  01/12/2022 9:51 AM  Edward [name]  has presented today for surgery, with the diagnosis of necrosis.  The various methods of treatment have been discussed with the patient and family. After consideration of risks, benefits and other options for treatment, the patient has consented to  Procedure(s): LEFT INDEX AMPUTATION, RIGHT INDEX LONG AND SMALL DIGITS AMPUTATION (Bilateral) as a surgical intervention.  We will plan for amputation of left index at mp level.  He wishes to preserve length of the digits on the right hand as much as possible.  Amputation at pip or proximal will still be necessary in index and long finger and middle phalanx or pip joint in small finger.  He is comfortable with the plan.  The patient's history has been reviewed, patient examined, no change in status, stable for surgery.  I have reviewed the patient's chart and labs.  Questions were answered to the patient's satisfaction.     Edward [name]

## 2022-01-12 NOTE — Anesthesia Procedure Notes (Signed)
Procedure Name: LMA Insertion Date/Time: 01/12/2022 10:10 AM Performed by: Moshe Salisbury, CRNA Pre-anesthesia Checklist: Patient identified, Emergency Drugs available, Suction available and Patient being monitored Patient Re-evaluated:Patient Re-evaluated prior to induction Oxygen Delivery Method: Circle System Utilized Preoxygenation: Pre-oxygenation with 100% oxygen Induction Type: IV induction Ventilation: Mask ventilation without difficulty LMA: LMA inserted LMA Size: 5.0 Number of attempts: 1 Placement Confirmation: positive ETCO2 Tube secured with: Tape Dental Injury: Teeth and Oropharynx as per pre-operative assessment

## 2022-01-12 NOTE — Progress Notes (Signed)
  Progress Note    01/12/2022 10:11 AM Day of Surgery  Subjective: No complaints this morning.  Vitals:   01/12/22 0506 01/12/22 0838  BP: 117/76 113/71  Pulse: 78 75  Resp: 16 18  Temp: (!) 97.3 F (36.3 C) 97.8 F (36.6 C)  SpO2: 100% 100%    Physical Exam: Awake alert oriented Right AKA with staples healing well  CBC    Component Value Date/Time   WBC 8.1 01/12/2022 0041   RBC 2.51 (L) 01/12/2022 0041   HGB 7.8 (L) 01/12/2022 0041   HCT 24.7 (L) 01/12/2022 0041   PLT 263 01/12/2022 0041   MCV 98.4 01/12/2022 0041   MCH 31.1 01/12/2022 0041   MCHC 31.6 01/12/2022 0041   RDW 16.4 (H) 01/12/2022 0041   LYMPHSABS 0.7 01/12/2022 0041   MONOABS 0.7 01/12/2022 0041   EOSABS 0.3 01/12/2022 0041   BASOSABS 0.0 01/12/2022 0041    BMET    Component Value Date/Time   NA 132 (L) 01/12/2022 0041   NA 140 04/30/2019 1507   K 3.8 01/12/2022 0041   CL 95 (L) 01/12/2022 0041   CO2 24 01/12/2022 0041   GLUCOSE 153 (H) 01/12/2022 0041   BUN 28 (H) 01/12/2022 0041   BUN 37 (H) 04/30/2019 1507   CREATININE 5.81 (H) 01/12/2022 0041   CREATININE 1.70 (H) 06/15/2018 1458   CALCIUM 8.6 (L) 01/12/2022 0041   GFRNONAA 11 (L) 01/12/2022 0041   GFRAA 22 (L) 11/26/2019 1151    INR    Component Value Date/Time   INR 1.2 09/28/2020 0340     Intake/Output Summary (Last 24 hours) at 01/12/2022 1011 Last data filed at 01/12/2022 0300 Gross per 24 hour  Intake 653.13 ml  Output --  Net 653.13 ml     Assessment:  51 y.o. male is s/p aortogram with bilateral upper and lower extremity evaluation.  Right AKA healing well.  Plan: OR today with Dr. Fredna Dow for finger amputations.  There are no revascularization options in the right upper extremity, left upper extremity will require fistula ligation and likely permanent catheter placement if he has healing issues.  Right AKA healing well    Shannan Garfinkel C. Donzetta Matters, MD [facility] Office:  [phone] Pager: [phone]  01/12/2022 10:11 AM

## 2022-01-12 NOTE — Transfer of Care (Signed)
Immediate Anesthesia Transfer of Care Note  Patient: Edward [name]  Procedure(s) Performed: LEFT INDEX FINGER AMPUTATION, RIGHT INDEX, LONG AND SMALL DIGITS AMPUTATION (Bilateral)  Patient Location: PACU  Anesthesia Type:General  Level of Consciousness: drowsy and patient cooperative  Airway & Oxygen Therapy: Patient Spontanous Breathing and Patient connected to nasal cannula oxygen  Post-op Assessment: Report given to RN, Post -op Vital signs reviewed and stable and Patient moving all extremities  Post vital signs: Reviewed and stable  Last Vitals:  Vitals Value Taken Time  BP 115/60 01/12/22 1235  Temp    Pulse    Resp 15 01/12/22 1243  SpO2    Vitals shown include unvalidated device data.  Last Pain:  Vitals:   01/12/22 0852  TempSrc:   PainSc: 4       Patients Stated Pain Goal: 0 (43/27/61 4709)  Complications: No notable events documented.

## 2022-01-12 NOTE — Discharge Instructions (Signed)

## 2022-01-12 NOTE — Plan of Care (Signed)
  Problem: Clinical Measurements: Goal: Ability to maintain clinical measurements within normal limits will improve Outcome: Progressing Goal: Will remain free from infection Outcome: Progressing Goal: Respiratory complications will improve Outcome: Progressing Goal: Cardiovascular complication will be avoided Outcome: Progressing   Problem: Activity: Goal: Risk for activity intolerance will decrease Outcome: Progressing   Problem: Coping: Goal: Level of anxiety will decrease Outcome: Progressing   Problem: Elimination: Goal: Will not experience complications related to bowel motility Outcome: Progressing Goal: Will not experience complications related to urinary retention Outcome: Progressing   Problem: Pain Managment: Goal: General experience of comfort will improve Outcome: Progressing

## 2022-01-12 NOTE — Progress Notes (Signed)
[facility] Progress Note   Subjective:   Afebrile and hemodynamically stable overnight-  due for routine HD today-  Dr. Fredna Dow has seen the hand- unfortunately needs more finger amputations-  possibly today   Objective Vitals:   01/11/22 1619 01/11/22 2007 01/12/22 0023 01/12/22 0506  BP: 113/61 98/62 125/80 117/76  Pulse: 72 72 78 78  Resp: '16 16 16 16  '$ Temp: 98.4 F (36.9 C) 98.4 F (36.9 C) 98.2 F (36.8 C) (!) 97.3 F (36.3 C)  TempSrc: Oral Oral Oral Oral  SpO2: 98%  100% 100%  Weight:      Height:       Physical Exam General:chronically ill appearing male in NAD Heart:RRR, no mrg Lungs:CTAB, nml WOB on RA Abdomen:soft, NTND Extremities:R AKA, L TMA dressing on foot/ankle - trace edema Dialysis Access: left AVF-  patent   Filed Weights   01/08/22 1230 01/08/22 1259 01/11/22 0600  Weight: 104.3 kg 104.3 kg 88.3 kg    Intake/Output Summary (Last 24 hours) at 01/12/2022 0557 Last data filed at 01/12/2022 0300 Gross per 24 hour  Intake 993.13 ml  Output --  Net 993.13 ml    Additional Objective Labs: Basic Metabolic Panel: Recent Labs  Lab 01/08/22 0543 01/08/22 0643 01/11/22 0239 01/11/22 1755 01/12/22 0041  NA  --    < > 136 135 132*  K  --    < > 2.9* 3.9 3.8  CL  --    < > 97* 95* 95*  CO2  --    < > '27 25 24  '$ GLUCOSE  --    < > 94 135* 153*  BUN  --    < > 14 23* 28*  CREATININE  --    < > 3.31* 5.19* 5.81*  CALCIUM  --    < > 8.6* 9.1 8.6*  PHOS 9.1*  --   --  3.2  --    < > = values in this interval not displayed.   Liver Function Tests: Recent Labs  Lab 01/10/22 0639 01/11/22 0239 01/11/22 1755 01/12/22 0041  AST 48* 34  --  24  ALT 15 12  --  8  ALKPHOS 80 108  --  112  BILITOT 1.5* 0.7  --  0.8  PROT 6.3* 6.8  --  6.3*  ALBUMIN 2.5* 2.5* 2.7* 2.4*    CBC: Recent Labs  Lab 01/10/22 0639 01/10/22 1201 01/11/22 0239 01/11/22 1755 01/12/22 0041  WBC 10.5 11.8* 9.9 9.0 8.1  NEUTROABS 8.8*  --  8.0*  --  6.3   HGB 7.5* 7.6* 7.9* 8.7* 7.8*  HCT 22.6* 22.6* 22.4* 26.2* 24.7*  MCV 97.8 97.4 95.3 97.8 98.4  PLT 201 214 241 281 263    CBG: Recent Labs  Lab 01/11/22 1141 01/11/22 1218 01/11/22 1252 01/11/22 1628 01/11/22 2119  GLUCAP 51* 74 119* 143* 78    Studies/Results: No results found.  Medications:  sodium chloride     sodium chloride     sodium chloride 250 mL (01/11/22 2340)   meropenem (MERREM) IV 500 mg (01/11/22 2341)   vancomycin Stopped (01/11/22 0451)    aspirin EC  81 mg Oral Daily   atorvastatin  80 mg Oral Daily   Chlorhexidine Gluconate Cloth  6 each Topical Q0600   Chlorhexidine Gluconate Cloth  6 each Topical Q0600   Chlorhexidine Gluconate Cloth  6 each Topical Q0600   clopidogrel  75 mg Oral Daily   darbepoetin (ARANESP) injection -  DIALYSIS  100 mcg Intravenous Q Thu-HD   doxercalciferol  11 mcg Intravenous Q T,Th,Sa-HD   ezetimibe  10 mg Oral Daily   gabapentin  100 mg Oral BID   heparin  5,000 Units Subcutaneous Q8H   insulin aspart protamine- aspart  10 Units Subcutaneous BID WC   linaclotide  72 mcg Oral QAC breakfast   pantoprazole  40 mg Oral BID   polyethylene glycol  17 g Oral Daily   povidone-iodine  2 application. Topical Once   sucroferric oxyhydroxide  1,000 mg Oral TID WC    Dialysis Orders: T[facility]  4.5h  425/700  94kg  2/2 bath  LUA AVF   Heparin 8000+ 2042mdrun prn - venofer 100 mg tiw IV , 5/04- 5/25 - mircera 30 ug q4 wks, last 5/4, due early June - hectorol 11 ug tiw IV - last HD 5/20, 93.3 > 92.0 kg - Hb 9.9 on 5/18     Assessment/ Plan: R BKA stump pain/ severe PAD/Possible cellulits + necrotic fingers- s/p AKA on 01/08/22 by Dr. CDonzetta Matters BC NGTD. Remains on vanc and meropenam.  Had "episode" on 5/24-  confusion/hypotension/fever-  now resolved-   ESRD - on HD TTS. HD Thursday which got put off to middle of the night-  next due today (Sat) on schedule via AVF.  Hyperkalemia - resolved.--  Severe PAD - sp L TMA, multiple  amputations of L hand-  VVS involved-  said if continues to have issues may need ligation of AVF.  At least for amputation of some fingers possibly today -  will need to coordinate HD with surgery  CAD/ ICM / combined CHF - EF 40-45% DM2 - per pmd BP/ vol - BP's soft to normal. Way over dry weight per bed weights. UF as tolerated.  Will need new dry weight not post AKA.-- yesterday weight seems more appropriate-   no BP meds Anemia esrd - Hb drop to 7.4 post surgery-  then 6.9. Hold iron d/t potential infection.  Order ESA with next HD-  got 100 mcg.   Transfuse prn for Hgb<7-  needed on 5/24. MBD ckd - Ca in range, phos elevated. Cont velphoro as binder and IV vdra.  Nutrition - renal diet w/fluid restrictions. Alb 2.5 - give protein supplements  Tenea Sens A GMcIntosh5/27/2023,5:57 AM  LOS: 5 days

## 2022-01-12 NOTE — Anesthesia Postprocedure Evaluation (Signed)
Anesthesia Post Note  Patient: Edward [name]  Procedure(s) Performed: LEFT INDEX FINGER AMPUTATION, RIGHT INDEX, LONG AND SMALL DIGITS AMPUTATION (Bilateral)     Patient location during evaluation: PACU Anesthesia Type: General Level of consciousness: awake and alert Pain management: pain level controlled Vital Signs Assessment: post-procedure vital signs reviewed and stable Respiratory status: spontaneous breathing, nonlabored ventilation, respiratory function stable and patient connected to nasal cannula oxygen Cardiovascular status: blood pressure returned to baseline and stable Postop Assessment: no apparent nausea or vomiting Anesthetic complications: no   No notable events documented.  Last Vitals:  Vitals:   01/12/22 1305 01/12/22 1327  BP: 131/80 114/82  Pulse: 85 86  Resp: (!) 22   Temp:  36.4 C  SpO2: 100% 100%    Last Pain:  Vitals:   01/12/22 1327  TempSrc: Oral  PainSc: 1                  Kristoff Coonradt S

## 2022-01-12 NOTE — Anesthesia Preprocedure Evaluation (Signed)
Anesthesia Evaluation  Patient identified by MRN, date of birth, ID band Patient awake    Reviewed: Allergy & Precautions, NPO status , Patient's Chart, lab work & pertinent test results  Airway Mallampati: II  TM Distance: >3 FB Neck ROM: Full    Dental no notable dental hx.    Pulmonary sleep apnea , former smoker,    Pulmonary exam normal breath sounds clear to auscultation       Cardiovascular hypertension, + CAD, + Past MI, + Peripheral Vascular Disease and +CHF  Normal cardiovascular exam Rhythm:Regular Rate:Normal  EF 45%   Neuro/Psych negative neurological ROS  negative psych ROS   GI/Hepatic negative GI ROS, Neg liver ROS,   Endo/Other  diabetes, Insulin Dependent  Renal/GU DialysisRenal disease  negative genitourinary   Musculoskeletal negative musculoskeletal ROS (+)   Abdominal   Peds negative pediatric ROS (+)  Hematology  (+) Blood dyscrasia, anemia ,   Anesthesia Other Findings   Reproductive/Obstetrics negative OB ROS                             Anesthesia Physical Anesthesia Plan  ASA: 4  Anesthesia Plan: General   Post-op Pain Management:    Induction: Intravenous  PONV Risk Score and Plan: 2 and Ondansetron and Treatment may vary due to age or medical condition  Airway Management Planned: LMA  Additional Equipment:   Intra-op Plan:   Post-operative Plan: Extubation in OR  Informed Consent: I have reviewed the patients History and Physical, chart, labs and discussed the procedure including the risks, benefits and alternatives for the proposed anesthesia with the patient or authorized representative who has indicated his/her understanding and acceptance.     Dental advisory given  Plan Discussed with: CRNA and Surgeon  Anesthesia Plan Comments:         Anesthesia Quick Evaluation

## 2022-01-12 NOTE — Op Note (Signed)
NAME: Edward [name] MEDICAL RECORD NO: [id number] DATE OF BIRTH: December 03, 1970 FACILITY: [facility] LOCATION: [facility] OR PHYSICIAN: Tennis Must, MD   OPERATIVE REPORT   DATE OF PROCEDURE: 01/12/22    PREOPERATIVE DIAGNOSIS: Left index finger wound breakdown with exposed bone, right index long and small finger digital necrosis   POSTOPERATIVE DIAGNOSIS:  Left index finger wound breakdown with exposed bone, right index long and small finger digital necrosis   PROCEDURE: 1.  Left index finger amputation at MP joint 2.  Right index finger amputation through proximal phalanx 3.  Right long finger amputation through proximal phalanx 4.  Right small finger amputation through PIP joint   SURGEON:  Leanora Cover, M.D.   ASSISTANT: none   ANESTHESIA:  General   INTRAVENOUS FLUIDS:  Per anesthesia flow sheet.   ESTIMATED BLOOD LOSS:  Minimal.   COMPLICATIONS:  None.   SPECIMENS: Amputated digits to pathology for gross exam   TOURNIQUET TIME:    Total Tourniquet Time Documented: Forearm (Left) - 29 minutes Total: Forearm (Left) - 29 minutes  Forearm (Right) - 62 minutes Total: Forearm (Right) - 62 minutes    DISPOSITION:  Stable to PACU.   INDICATIONS: 52 year old male on dialysis.  He has had ischemia of multiple digits requiring previous amputation of left index finger.  The wound has broken down in the proximal phalanx condyles are exposed in the wound.  He also has necrosis of the right index long and small fingers.  He wishes to proceed with amputation of these digits.  Risks, benefits and alternatives of surgery were discussed including the risks of blood loss, infection, damage to nerves, vessels, tendons, ligaments, bone for surgery, need for additional surgery, complications with wound healing, continued pain, stiffness.  He voiced understanding of these risks and elected to proceed.  OPERATIVE COURSE:  After being identified preoperatively by myself,  the patient and I agreed  on the procedure and site of the procedure.  The surgical site was marked.  Surgical consent had been signed.  He is on scheduled antibiotics.  He was transferred to the operating room and placed on the operating table in supine position with the Bilateral upper extremity on arm boards.  General anesthesia was induced by the anesthesiologist.  Left upper extremity was prepped and draped in normal sterile orthopedic fashion.  A surgical pause was performed between the surgeons, anesthesia, and operating room staff and all were in agreement as to the patient, procedure, and site of procedure.  Tourniquet at the proximal aspect of the forearm was inflated to 250 mmHg after exsanguination of the arm with an Esmarch bandage.  An incision was made around the base of the index finger leaving a paddle of volar tissue for coverage.  This was carried into subcutaneous tissues by spreading technique.  Bipolar electrocautery was used to obtain hemostasis.  The radial and ulnar digital nerves were identified placed under traction bipolar and allowed to retract.  The radial and ulnar digital arteries were identified and tied with 4-0 Vicryl suture.  They were then bipolared and transected.  The flexor tendons were transected.  The incision was completed on the dorsum of the finger and the bipolar used to obtain hemostasis.  The finger was amputated through the MP joint.  The wound was copiously irrigated with sterile saline.  Was closed with 5-0 Monocryl suture in a horizontal mattress and interrupted fashion.  Good tension-free reapproximation of soft tissues was obtained.  A digital block was performed with  quarter percent plain Marcaine to aid in postoperative analgesia.  The wound was dressed with sterile Xeroform and 4 x 4's and wrapped with Kerlix and a Coban dressing lightly.  The tourniquet was deflated at 29 minutes.  The right upper extremity is then prepped and draped in normal sterile orthopedic fashion.  A surgical  pause was again performed between surgeons anesthesia and operating room staff and all were in agreement as to the patient procedure and site procedure.  Tourniquet at the proximal aspect of the forearm was inflated to 250 mmHg after exsanguination of the hand and forearm with an Esmarch bandage.  A fishmouth type incision was made at the index finger at the level of the PIP joint.  This was carried into subcutaneous tissues by spreading technique.  Bipolar electrocautery was used to obtain hemostasis.  The radial and ulnar digital nerves were identified placed under traction bipolared and allowed to retract.  The radial and ulnar digital arteries were identified and tied with the 4-0 Vicryl suture.  They were then bipolared transected and allowed to retract.  The flexor tendons were sharply transected.  The finger was amputated through the PIP joint.  There was skin of questionable viability at the ulnar side of the incision.  The skin was removed.  The proximal phalanx was shortened proximal to the condyles to allow reapproximation of soft tissues.  The rongeurs were used to smooth out the bone edges.  The wound was copiously irrigated with sterile saline.  It was then closed with four 5-0 Monocryl suture in a horizontal mattress and interrupted fashion.  Good tension-free reapproximation of the soft tissues was obtained.  The long finger was then addressed.  Again a fishmouth type incision was made at the level of the PIP joint.  This was carried in subcutaneous tissues by spreading technique.  Bipolar electrocautery was used to obtain hemostasis.  The radial and ulnar digital nerves were identified placed under traction bipolared and allowed to retract.  The radial and ulnar digital arteries were tied with 4-0 Vicryl suture bipolared and allowed to retract.  The flexor tendons were transected.  The long finger was amputated through the PIP joint.  The skin had acceptable appearance.  The proximal phalanx was  amputated through the bone just proximal to the condyles to allow good soft tissue coverage.  The wound was copiously irrigated with sterile saline.  The skin edges were reapproximated with 5-0 Monocryl suture in a horizontal mattress fashion.  Good tension-free reapproximation was obtained.  The small finger was addressed.  A fishmouth type incision was made just distal to the PIP joint through what appeared to be good skin.  This was carried in subcutaneous tissues by spreading technique.  Bipolar electrocautery was used to obtain hemostasis.  The radial and ulnar digital nerves were identified placed under traction bipolared and allowed to retract.  The radial and ulnar digital arteries were diminutive in size.  These were bipolared and transected.  The flexor tendons were sharply incised.  The finger was amputated through the PIP joint.  The skin was able to be brought over the end of the bone without tension.  The wound was copiously irrigated with sterile saline.  It was closed with 5-0 Monocryl suture in a horizontal mattress fashion.  Good tension-free reapproximation was obtained.  Digital blocks were performed using quarter percent plain Marcaine to aid in postoperative analgesia.  The wounds were all dressed with sterile Xeroform and 4 x 4's and wrapped with  Coban dressing lightly.  Tourniquet was deflated at 62 minutes.  The operative  drapes were broken down.  The patient was awoken from anesthesia safely.  He was transferred back to the stretcher and taken to PACU in stable condition.  He is currently admitted.  I will see him in the office in 1 week for postoperative follow-up.   Leanora Cover, MD Electronically signed, 01/12/22

## 2022-01-13 ENCOUNTER — Encounter (HOSPITAL_COMMUNITY): Payer: Self-pay | Admitting: Orthopedic Surgery

## 2022-01-13 DIAGNOSIS — I739 Peripheral vascular disease, unspecified: Secondary | ICD-10-CM | POA: Diagnosis not present

## 2022-01-13 LAB — COMPREHENSIVE METABOLIC PANEL
ALT: 12 U/L (ref 0–44)
AST: 31 U/L (ref 15–41)
Albumin: 2.4 g/dL — ABNORMAL LOW (ref 3.5–5.0)
Alkaline Phosphatase: 108 U/L (ref 38–126)
Anion gap: 11 (ref 5–15)
BUN: 25 mg/dL — ABNORMAL HIGH (ref 6–20)
CO2: 26 mmol/L (ref 22–32)
Calcium: 8.7 mg/dL — ABNORMAL LOW (ref 8.9–10.3)
Chloride: 96 mmol/L — ABNORMAL LOW (ref 98–111)
Creatinine, Ser: 4.65 mg/dL — ABNORMAL HIGH (ref 0.61–1.24)
GFR, Estimated: 14 mL/min — ABNORMAL LOW (ref >60)
Glucose, Bld: 286 mg/dL — ABNORMAL HIGH (ref 70–99)
Potassium: 5.6 mmol/L — ABNORMAL HIGH (ref 3.5–5.1)
Sodium: 133 mmol/L — ABNORMAL LOW (ref 135–145)
Total Bilirubin: 1.3 mg/dL — ABNORMAL HIGH (ref 0.3–1.2)
Total Protein: 6.5 g/dL (ref 6.5–8.1)

## 2022-01-13 LAB — GLUCOSE, CAPILLARY
Glucose-Capillary: 279 mg/dL — ABNORMAL HIGH (ref 70–99)
Glucose-Capillary: 309 mg/dL — ABNORMAL HIGH (ref 70–99)
Glucose-Capillary: 322 mg/dL — ABNORMAL HIGH (ref 70–99)
Glucose-Capillary: 339 mg/dL — ABNORMAL HIGH (ref 70–99)

## 2022-01-13 LAB — CBC WITH DIFFERENTIAL/PLATELET
Abs Immature Granulocytes: 0.06 10*3/uL (ref 0.00–0.07)
Basophils Absolute: 0 10*3/uL (ref 0.0–0.1)
Basophils Relative: 0 %
Eosinophils Absolute: 0 10*3/uL (ref 0.0–0.5)
Eosinophils Relative: 0 %
HCT: 26.2 % — ABNORMAL LOW (ref 39.0–52.0)
Hemoglobin: 8.5 g/dL — ABNORMAL LOW (ref 13.0–17.0)
Immature Granulocytes: 1 %
Lymphocytes Relative: 4 %
Lymphs Abs: 0.4 10*3/uL — ABNORMAL LOW (ref 0.7–4.0)
MCH: 32 pg (ref 26.0–34.0)
MCHC: 32.4 g/dL (ref 30.0–36.0)
MCV: 98.5 fL (ref 80.0–100.0)
Monocytes Absolute: 0.3 10*3/uL (ref 0.1–1.0)
Monocytes Relative: 3 %
Neutro Abs: 9.6 10*3/uL — ABNORMAL HIGH (ref 1.7–7.7)
Neutrophils Relative %: 92 %
Platelets: 297 10*3/uL (ref 150–400)
RBC: 2.66 MIL/uL — ABNORMAL LOW (ref 4.22–5.81)
RDW: 16.3 % — ABNORMAL HIGH (ref 11.5–15.5)
WBC: 10.4 10*3/uL (ref 4.0–10.5)
nRBC: 0 % (ref 0.0–0.2)

## 2022-01-13 LAB — CULTURE, BLOOD (ROUTINE X 2)
Culture: NO GROWTH
Culture: NO GROWTH
Special Requests: ADEQUATE
Special Requests: ADEQUATE

## 2022-01-13 LAB — PROCALCITONIN: Procalcitonin: 7 ng/mL

## 2022-01-13 MED ORDER — VANCOMYCIN HCL 750 MG/150ML IV SOLN
750.0000 mg | INTRAVENOUS | Status: AC
  Filled 2022-01-13: qty 150

## 2022-01-13 MED ORDER — SODIUM ZIRCONIUM CYCLOSILICATE 10 G PO PACK
10.0000 g | PACK | Freq: Every day | ORAL | Status: DC
Start: 2022-01-13 — End: 2022-01-16
  Administered 2022-01-13 – 2022-01-16 (×3): 10 g via ORAL
  Filled 2022-01-13 (×3): qty 1

## 2022-01-13 MED ORDER — INSULIN ASPART PROT & ASPART (70-30 MIX) 100 UNIT/ML ~~LOC~~ SUSP
5.0000 [IU] | Freq: Once | SUBCUTANEOUS | Status: AC
  Administered 2022-01-13: 5 [IU] via SUBCUTANEOUS
  Filled 2022-01-13: qty 10

## 2022-01-13 MED ORDER — INSULIN ASPART PROT & ASPART (70-30 MIX) 100 UNIT/ML ~~LOC~~ SUSP
18.0000 [IU] | Freq: Two times a day (BID) | SUBCUTANEOUS | Status: DC
  Administered 2022-01-13: 18 [IU] via SUBCUTANEOUS

## 2022-01-13 NOTE — Progress Notes (Addendum)
Switched forehead pulse oximetry to above left eye.

## 2022-01-13 NOTE — Progress Notes (Addendum)
[facility] Progress Note   Subjective:   underwent finger amputations yest as well as brief HD last night ( family was concerned that last episode was brought on by HD) -  removed 1100-  is alert this AM-  already ate breakfast  Objective Vitals:   01/12/22 1930 01/12/22 2008 01/13/22 0003 01/13/22 0541  BP: 139/76 135/66 (!) 124/58 104/69  Pulse: 82 94    Resp: '14 14 18 20  '$ Temp: 97.6 F (36.4 C) 98 F (36.7 C) 98.5 F (36.9 C) 98.7 F (37.1 C)  TempSrc: Oral Oral Oral Oral  SpO2:  100% 100% 100%  Weight: 89.2 kg     Height:       Physical Exam General:chronically ill appearing male in NAD Heart:RRR, no mrg Lungs:CTAB, nml WOB on RA Abdomen:soft, NTND Extremities:R AKA, L TMA dressing on foot/ankle - trace edema Dialysis Access: left AVF-  patent   Filed Weights   01/11/22 0600 01/12/22 1603 01/12/22 1930  Weight: 88.3 kg 90.3 kg 89.2 kg    Intake/Output Summary (Last 24 hours) at 01/13/2022 0720 Last data filed at 01/13/2022 0300 Gross per 24 hour  Intake 771.23 ml  Output 1165 ml  Net -393.77 ml    Additional Objective Labs: Basic Metabolic Panel: Recent Labs  Lab 01/08/22 0543 01/08/22 0643 01/11/22 1755 01/12/22 0041 01/13/22 0055  NA  --    < > 135 132* 133*  K  --    < > 3.9 3.8 5.6*  CL  --    < > 95* 95* 96*  CO2  --    < > '25 24 26  '$ GLUCOSE  --    < > 135* 153* 286*  BUN  --    < > 23* 28* 25*  CREATININE  --    < > 5.19* 5.81* 4.65*  CALCIUM  --    < > 9.1 8.6* 8.7*  PHOS 9.1*  --  3.2  --   --    < > = values in this interval not displayed.   Liver Function Tests: Recent Labs  Lab 01/11/22 0239 01/11/22 1755 01/12/22 0041 01/13/22 0055  AST 34  --  24 31  ALT 12  --  8 12  ALKPHOS 108  --  112 108  BILITOT 0.7  --  0.8 1.3*  PROT 6.8  --  6.3* 6.5  ALBUMIN 2.5* 2.7* 2.4* 2.4*    CBC: Recent Labs  Lab 01/10/22 1201 01/11/22 0239 01/11/22 1755 01/12/22 0041 01/13/22 0055  WBC 11.8* 9.9 9.0 8.1 10.4   NEUTROABS  --  8.0*  --  6.3 9.6*  HGB 7.6* 7.9* 8.7* 7.8* 8.5*  HCT 22.6* 22.4* 26.2* 24.7* 26.2*  MCV 97.4 95.3 97.8 98.4 98.5  PLT 214 241 281 263 297    CBG: Recent Labs  Lab 01/12/22 1240 01/12/22 1330 01/12/22 1548 01/12/22 2150 01/13/22 0618  GLUCAP 138* 163* 196* 268* 279*    Studies/Results: No results found.  Medications:  sodium chloride     sodium chloride     sodium chloride 250 mL (01/12/22 2356)   meropenem (MERREM) IV 500 mg (01/12/22 2357)   vancomycin Stopped (01/11/22 0451)    aspirin EC  81 mg Oral Daily   atorvastatin  80 mg Oral Daily   Chlorhexidine Gluconate Cloth  6 each Topical Q0600   Chlorhexidine Gluconate Cloth  6 each Topical Q0600   Chlorhexidine Gluconate Cloth  6 each Topical Q0600   clopidogrel  75 mg Oral Daily   darbepoetin (ARANESP) injection - DIALYSIS  100 mcg Intravenous Q Thu-HD   doxercalciferol  11 mcg Intravenous Q T,Th,Sa-HD   ezetimibe  10 mg Oral Daily   gabapentin  100 mg Oral BID   heparin  5,000 Units Subcutaneous Q8H   insulin aspart  0-6 Units Subcutaneous TID WC   insulin aspart protamine- aspart  10 Units Subcutaneous BID WC   linaclotide  72 mcg Oral QAC breakfast   pantoprazole  40 mg Oral BID   polyethylene glycol  17 g Oral Daily   sucroferric oxyhydroxide  1,000 mg Oral TID WC    Dialysis Orders: [facility]  4.5h  425/700  94kg  2/2 bath  LUA AVF   Heparin 8000+ [id number] prn - venofer 100 mg tiw IV , 5/04- 5/25 - mircera 30 ug q4 wks, last 5/4, due early June - hectorol 11 ug tiw IV - last HD 5/20, 93.3 > 92.0 kg - Hb 9.9 on 5/18     Assessment/ Plan: R BKA stump pain/ severe PAD/Possible cellulits + necrotic fingers- s/p AKA on 01/08/22 by Dr. CDonzetta Matters [facility] NGTD. Remains on vanc and meropenam.  Had "episode" on 5/24-  confusion/hypotension/fever-  now resolved-   ESRD - on HD TTS. HD Thursday which got put off to middle of the night-  then Saturday- next due Tuesday on schedule via AVF.  Ran on 3 k  bath with K of 3.8 but K of 5.6 this AM ?  Hyperkalemia - had resolved.-- but K of 5.6 this AM- since not planning HD til Tuesday will give lokelma today  Severe PAD - sp L TMA, multiple amputations of L hand-  VVS involved-  said if continues to have issues may need ligation of AVF.  amputation of some fingers 5/27-   CAD/ ICM / combined CHF - EF 40-45% DM2 - per pmd BP/ vol - BP's soft to normal. Way over dry weight per bed weights. UF as tolerated.  Will need new dry weight not post AKA.--  weight of late seems more appropriate-   no BP meds Anemia esrd - Hb drop to 7.4 post surgery-  then 6.9. Hold iron d/t potential infection.  Order ESA -  got 100 mcg.   Transfuse prn for Hgb<7-  needed on 5/25. MBD ckd - Ca in range, phos elevated. Cont velphoro as binder and IV vdra.  Nutrition - renal diet w/fluid restrictions. Alb 2.5 - give protein supplements  Daylan Boggess A G[place]5/28/2023,7:20 AM  LOS: 6 days

## 2022-01-13 NOTE — Progress Notes (Signed)
PROGRESS NOTE                                                                                                                                                                                                             Patient Demographics:    Edward [name], is a 51 y.o. male, DOB - 01/26/1971, WUJ:[id number]  Outpatient Primary MD for the patient is [name], MD    LOS - 6  Admit date - 01/07/2022    No chief complaint on file.      Brief Narrative (HPI from H&P)    51 y.o. male, history of severe PAD s/p left transmetatarsal amputation, right BKA with necrotic stump, multiple amputation in left hand fingers, ESRD on TTS schedule for dialysis, DM type 2 insulin-dependent, dyslipidemia, CAD with MI in past, combined systolic and diastolic CHF EF 95%, orthostatic hypotension of chronic disease, AAA, recent Zio patch placement, who was brought in to the Cath Lab for elective bilateral lower extremity angiogram which was done by Dr. Donzetta Matters to evaluate ongoing right BKA stump pain and necrosis, postprocedure hospitalist team was requested to admit the patient as he may require further revision of his right lower extremity stump and underwent AKA this admission complicated by sepsis.   Subjective:   Patient in bed, appears comfortable, denies any headache, no fever, no chest pain or pressure, no shortness of breath , no abdominal pain. No new focal weakness.    Assessment  & Plan :   1.  Right BKA stump site discomfort, history of severe PAD, with a right BKA stump necrosis and post AKA Sepsis.  He has been seen by critical care and vascular surgery, on appropriate IV antibiotics with negative blood cultures, source of infection could be right leg BKA stump infection which eventually required revision into AKA.  Sepsis pathophysiology has resolved, continue IV antibiotics for a total of 7 days.  Monitor cultures closely.  For PAD  continue dual antiplatelet therapy along with statin and Zetia.  Vascular surgery is following his postop stump site appears clean.   2.  Severe PAD requiring right BKA and now right AKA, left transmetatarsal amputation, multiple amputations of left upper extremity fingers - for PAD DAPT, Zetia and statin for secondary prevention.  Vascular surgery on board.  Hand surgeon Dr. Fredna Dow also on board patient underwent  on 01/12/2022 -  1.  Left index finger amputation at MP joint 2.  Right index finger amputation through proximal phalanx 3.  Right long finger amputation through proximal phalanx 4.  Right small finger amputation through PIP joint    3.  ESRD.  On TTS schedule, has left arm AV fistula, renal consulted.   4.  CAD with ischemic cardiomyopathy.  No acute issues supportive care, continue DAPT with statin for secondary prevention, has chronic orthostatic hypotension precluding the use of beta-blockers.   5.  Chronic combined systolic and diastolic CHF with EF 70%.  Currently compensated, fluid removal via HD, blood pressure too low to allow beta-blocker, ACE/ARB use.   6.  Dyslipidemia.  LDL above goal on max dose statin and Zetia added.   7.  Anemia of chronic disease with some perioperative blood loss related anemia along with heme dilution from IV fluids.  Due to ESRD.  On iron supplementation with HD treatments, received 1 unit of packed RBC early morning 01/10/2022, stable posttransfusion H&H no signs of active ongoing bleeding, on PPI as well.  He may require another unit of transfusion on 01/13/2022 based on his surgical blood loss on 01/12/2022.  8.  Hypokalemia in ESRD patient.  Gentle replacement.   9. DM type II.  Insulin-dependent.  Blood sugars running low's 7030 dose reduced continue sliding scale before every meal only.  Lab Results  Component Value Date   HGBA1C 6.5 (H) 01/07/2022   CBG (last 3)  Recent Labs    01/12/22 1548 01/12/22 2150 01/13/22 0618  GLUCAP 196*  268* 279*          Condition - Extremely Guarded  Family Communication  :    Called Son Fannie Knee [phone] - left message 01/09/22 - 4.50 pm  Code Status :  Full  Consults  :  Renal, VVS, PCCM  PUD Prophylaxis : PPI   Procedures  :     Dr. Fredna Dow hand surgeon on 01/12/2022 -  PROCEDURE:  1.  Left index finger amputation at MP joint 2.  Right index finger amputation through proximal phalanx 3.  Right long finger amputation through proximal phalanx 4.  Right small finger amputation through PIP joint  Right AKA on 01/08/2022      Disposition Plan  :    Status is: Inpatient  DVT Prophylaxis  :    heparin injection 5,000 Units Start: 01/07/22 2200  Lab Results  Component Value Date   PLT 297 01/13/2022    Diet :  Diet Order             Diet renal with fluid restriction Fluid restriction: 1200 mL Fluid; Room service appropriate? Yes; Fluid consistency: Thin  Diet effective now                    Inpatient Medications  Scheduled Meds:  aspirin EC  81 mg Oral Daily   atorvastatin  80 mg Oral Daily   Chlorhexidine Gluconate Cloth  6 each Topical Q0600   Chlorhexidine Gluconate Cloth  6 each Topical Q0600   Chlorhexidine Gluconate Cloth  6 each Topical Q0600   clopidogrel  75 mg Oral Daily   darbepoetin (ARANESP) injection - DIALYSIS  100 mcg Intravenous Q Thu-HD   doxercalciferol  11 mcg Intravenous Q T,Th,Sa-HD   ezetimibe  10 mg Oral Daily   gabapentin  100 mg Oral BID   heparin  5,000 Units Subcutaneous Q8H   insulin aspart  0-6 Units Subcutaneous  TID WC   insulin aspart protamine- aspart  18 Units Subcutaneous BID WC   insulin aspart protamine- aspart  5 Units Subcutaneous Once   linaclotide  72 mcg Oral QAC breakfast   pantoprazole  40 mg Oral BID   polyethylene glycol  17 g Oral Daily   sodium zirconium cyclosilicate  10 g Oral Daily   sucroferric oxyhydroxide  1,000 mg Oral TID WC   Continuous Infusions:  sodium chloride     sodium  chloride     sodium chloride 250 mL (01/12/22 2356)   meropenem (MERREM) IV 500 mg (01/12/22 2357)   vancomycin Stopped (01/11/22 0451)   PRN Meds:.sodium chloride, sodium chloride, alteplase, bisacodyl, bismuth subsalicylate, fentaNYL (SUBLIMAZE) injection, heparin, heparin, hydrALAZINE, labetalol, lidocaine (PF), lidocaine-prilocaine, naLOXone (NARCAN)  injection, ondansetron (ZOFRAN) IV, [DISCONTINUED] ondansetron **OR** ondansetron (ZOFRAN) IV, oxyCODONE, pentafluoroprop-tetrafluoroeth, traMADol  Time Spent in minutes  30   Lala Lund M.D on 01/13/2022 at 8:25 AM  To page go to [email or web address]   [facility] -  Office  [phone]  See all Orders from today for further details    Objective:   Vitals:   01/12/22 2008 01/13/22 0003 01/13/22 0541 01/13/22 0724  BP: 135/66 (!) 124/58 104/69   Pulse: 94   60  Resp: '14 18 20 15  '$ Temp: 98 F (36.7 C) 98.5 F (36.9 C) 98.7 F (37.1 C) 98.9 F (37.2 C)  TempSrc: Oral Oral Oral Oral  SpO2: 100% 100% 100% 96%  Weight:      Height:        Wt Readings from Last 3 Encounters:  01/12/22 89.2 kg  12/26/21 103.9 kg  11/28/21 104 kg     Intake/Output Summary (Last 24 hours) at 01/13/2022 0825 Last data filed at 01/13/2022 0300 Gross per 24 hour  Intake 771.23 ml  Output 1165 ml  Net -393.77 ml      Physical Exam  Awake Alert, No new F.N deficits, Normal affect [place].AT,PERRAL Supple Neck, No JVD,   Symmetrical Chest wall movement, Good air movement bilaterally, CTAB RRR,No Gallops, Rubs or new Murmurs,  +ve B.Sounds, Abd Soft, No tenderness,   No Cyanosis, Clubbing or edema  Right AKA stump site under bandage, right femoral cath site stable under bandage, left transmetatarsal amputation old, L hand 3 fingers, R hand 2 fingers, both hands partial digit amputation sites under bandage, left arm AV fistula present    Data Review:    CBC Recent Labs  Lab 01/09/22 0309 01/09/22 2245 01/10/22 0639 01/10/22 1201  01/11/22 0239 01/11/22 1755 01/12/22 0041 01/13/22 0055  WBC 12.2*   < > 10.5 11.8* 9.9 9.0 8.1 10.4  HGB 7.4*   < > 7.5* 7.6* 7.9* 8.7* 7.8* 8.5*  HCT 22.4*   < > 22.6* 22.6* 22.4* 26.2* 24.7* 26.2*  PLT 225   < > 201 214 241 281 263 297  MCV 99.6   < > 97.8 97.4 95.3 97.8 98.4 98.5  MCH 32.9   < > 32.5 32.8 33.6 32.5 31.1 32.0  MCHC 33.0   < > 33.2 33.6 35.3 33.2 31.6 32.4  RDW 15.9*   < > 17.0* 17.0* 16.8* 16.7* 16.4* 16.3*  LYMPHSABS 0.6*  --  0.6*  --  0.8  --  0.7 0.4*  MONOABS 1.0  --  0.9  --  0.6  --  0.7 0.3  EOSABS 0.2  --  0.1  --  0.3  --  0.3 0.0  BASOSABS 0.0  --  0.0  --  0.0  --  0.0 0.0   < > = values in this interval not displayed.    Electrolytes Recent Labs  Lab 01/07/22 1822 01/08/22 0543 01/08/22 0643 01/08/22 1314 01/09/22 0309 01/09/22 1803 01/09/22 2245 01/10/22 0639 01/11/22 0239 01/11/22 1755 01/12/22 0041 01/13/22 0055  NA  --   --  132*   < > 134*  --   --  135 136 135 132* 133*  K  --   --  5.5*   < > 4.2  --   --  4.0 2.9* 3.9 3.8 5.6*  CL  --   --  86*   < > 96*  --   --  95* 97* 95* 95* 96*  CO2  --    < > 21*  --  24  --   --  '22 27 25 24 26  '$ GLUCOSE  --   --  102*   < > 122*  --   --  88 94 135* 153* 286*  BUN  --   --  55*   < > 26*  --   --  39* 14 23* 28* 25*  CREATININE 8.60*  --  9.74*   < > 6.00*  --   --  8.17* 3.31* 5.19* 5.81* 4.65*  CALCIUM  --    < > 9.4  --  8.6*  --   --  8.8* 8.6* 9.1 8.6* 8.7*  AST  --   --   --   --  35  --   --  48* 34  --  24 31  ALT  --   --   --   --  17  --   --  15 12  --  8 12  ALKPHOS  --   --   --   --  70  --   --  80 108  --  112 108  BILITOT  --   --   --   --  1.0  --   --  1.5* 0.7  --  0.8 1.3*  ALBUMIN  --    < >  --   --  2.5*  --   --  2.5* 2.5* 2.7* 2.4* 2.4*  MG  --   --   --   --  2.0  --   --  2.1 2.1  --  2.0  --   CRP  --   --  19.7*  --  21.8*  --   --  25.5* 27.8*  --  21.3*  --   PROCALCITON  --    < > 1.16  --  8.75  --   --  13.89 15.25  --  11.14 7.00  LATICACIDVEN  --    --   --   --   --  5.8* 1.6  --   --   --   --   --   HGBA1C 6.5*  --   --   --   --   --   --   --   --   --   --   --    < > = values in this interval not displayed.    ------------------------------------------------------------------------------------------------------------------ No results for input(s): CHOL, HDL, LDLCALC, TRIG, CHOLHDL, LDLDIRECT in the last 72 hours.   Lab Results  Component Value Date   HGBA1C 6.5 (H) 01/07/2022    No results for input(s): TSH, T4TOTAL, T3FREE,  THYROIDAB in the last 72 hours.  Invalid input(s): FREET3 ------------------------------------------------------------------------------------------------------------------ ID Labs Recent Labs  Lab 01/08/22 0643 01/08/22 1314 01/09/22 0309 01/09/22 1803 01/09/22 2245 01/10/22 7622 01/10/22 1201 01/11/22 0239 01/11/22 1755 01/12/22 0041 01/13/22 0055  WBC 10.8*  --  12.2*  --  11.2* 10.5 11.8* 9.9 9.0 8.1 10.4  PLT 278  --  225  --  210 201 214 241 281 263 297  CRP 19.7*  --  21.8*  --   --  25.5*  --  27.8*  --  21.3*  --   PROCALCITON 1.16  --  8.75  --   --  13.89  --  15.25  --  11.14 7.00  LATICACIDVEN  --   --   --  5.8* 1.6  --   --   --   --   --   --   CREATININE 9.74*   < > 6.00*  --   --  8.17*  --  3.31* 5.19* 5.81* 4.65*   < > = values in this interval not displayed.   Cardiac Enzymes No results for input(s): CKMB, TROPONINI, MYOGLOBIN in the last 168 hours.  Invalid input(s): CK  Radiology Reports DG Chest Port 1 View  Result Date: 01/09/2022 CLINICAL DATA:  Shortness of breath EXAM: PORTABLE CHEST 1 VIEW COMPARISON:  Chest x-ray dated Jan 08, 2022 FINDINGS: Unchanged cardiomegaly. Mild bilateral heterogeneous opacities. No large pleural effusion or pneumothorax. IMPRESSION: 1. Mild bilateral heterogeneous opacities, likely due to pulmonary edema. 2. Unchanged cardiomegaly. Electronically Signed   By: Yetta Glassman M.D.   On: 01/09/2022 13:54

## 2022-01-13 NOTE — Progress Notes (Signed)
Switched forehead pulse oximetry to above right eye.

## 2022-01-13 NOTE — Progress Notes (Addendum)
Placed on forehead pulse oximetry to above right eye.

## 2022-01-13 NOTE — Progress Notes (Signed)
Pharmacy Antibiotic Note  Edward [name] is a 51 y.o. male admitted on 01/07/2022 with R BKA stump site discomfort. S/p R AKA 5/23 but pt with elevation in CRP and low-grade fevers. ?low grade cellulitis around amputation site. Pharmacy has been consulted for Vancomycin and meropenem  Pt continues on HD - T/T/S. Of note, updated weight is down 20 kg from previous weights.   5/26 VR 33: no dose adjustment required  Pt had a shorter HD session on 5/27. A smaller vanc dose was given. He has only one more dose of vanc left and stop date for merrem has been entered.   Plan: Continue Meropenem 500 mg IV Q24H until 5/31 Vancomycin '750mg'$  IV x1 on 5/30 post HD   Height: '6\' 1"'$  (185.4 cm) Weight: 89.2 kg (196 lb 10.4 oz) IBW/kg (Calculated) : 79.9  Temp (24hrs), Avg:98.2 F (36.8 C), Min:97.6 F (36.4 C), Max:98.9 F (37.2 C)  Recent Labs  Lab 01/09/22 1803 01/09/22 2245 01/10/22 0639 01/10/22 1201 01/11/22 0239 01/11/22 1242 01/11/22 1755 01/12/22 0041 01/13/22 0055  WBC  --  11.2* 10.5 11.8* 9.9  --  9.0 8.1 10.4  CREATININE  --   --  8.17*  --  3.31*  --  5.19* 5.81* 4.65*  LATICACIDVEN 5.8* 1.6  --   --   --   --   --   --   --   VANCORANDOM  --   --   --   --   --  33  --   --   --      Estimated Creatinine Clearance: 21 mL/min (A) (by C-G formula based on SCr of 4.65 mg/dL (H)).    Allergies  Allergen Reactions   Crestor [Rosuvastatin] Rash    Also had a red eye   Dilaudid [Hydromorphone] Itching   Benadryl [Diphenhydramine] Rash    Antimicrobials this admission:  Zosyn 5/24>> 5/24 Meropenem 5/24>>(5/31) Vanc 5/24>> (5/30)   Microbiology results: 5/23 BCx: NGTD  5/23 MRSA PCR: negative  Onnie Boer, PharmD, BCIDP, AAHIVP, CPP Infectious Disease Pharmacist 01/13/2022 1:08 PM

## 2022-01-14 DIAGNOSIS — I739 Peripheral vascular disease, unspecified: Secondary | ICD-10-CM | POA: Diagnosis not present

## 2022-01-14 LAB — RENAL FUNCTION PANEL
Albumin: 2.5 g/dL — ABNORMAL LOW (ref 3.5–5.0)
Anion gap: 13 (ref 5–15)
BUN: 45 mg/dL — ABNORMAL HIGH (ref 6–20)
CO2: 26 mmol/L (ref 22–32)
Calcium: 9.9 mg/dL (ref 8.9–10.3)
Chloride: 94 mmol/L — ABNORMAL LOW (ref 98–111)
Creatinine, Ser: 6.62 mg/dL — ABNORMAL HIGH (ref 0.61–1.24)
GFR, Estimated: 9 mL/min — ABNORMAL LOW (ref >60)
Glucose, Bld: 297 mg/dL — ABNORMAL HIGH (ref 70–99)
Phosphorus: 4.3 mg/dL (ref 2.5–4.6)
Potassium: 4.4 mmol/L (ref 3.5–5.1)
Sodium: 133 mmol/L — ABNORMAL LOW (ref 135–145)

## 2022-01-14 LAB — GLUCOSE, CAPILLARY
Glucose-Capillary: 249 mg/dL — ABNORMAL HIGH (ref 70–99)
Glucose-Capillary: 128 mg/dL — ABNORMAL HIGH (ref 70–99)
Glucose-Capillary: 101 mg/dL — ABNORMAL HIGH (ref 70–99)
Glucose-Capillary: 167 mg/dL — ABNORMAL HIGH (ref 70–99)

## 2022-01-14 MED ORDER — INSULIN ASPART 100 UNIT/ML IJ SOLN
0.0000 [IU] | Freq: Three times a day (TID) | INTRAMUSCULAR | Status: DC
  Administered 2022-01-14: 3 [IU] via SUBCUTANEOUS
  Administered 2022-01-14: 1 [IU] via SUBCUTANEOUS
  Administered 2022-01-15: 2 [IU] via SUBCUTANEOUS
  Administered 2022-01-15: 1 [IU] via SUBCUTANEOUS
  Administered 2022-01-16 (×2): 2 [IU] via SUBCUTANEOUS

## 2022-01-14 MED ORDER — INSULIN ASPART PROT & ASPART (70-30 MIX) 100 UNIT/ML ~~LOC~~ SUSP
25.0000 [IU] | Freq: Two times a day (BID) | SUBCUTANEOUS | Status: DC
  Administered 2022-01-14 – 2022-01-15 (×3): 25 [IU] via SUBCUTANEOUS

## 2022-01-14 MED ORDER — NEPRO/CARBSTEADY PO LIQD
237.0000 mL | Freq: Two times a day (BID) | ORAL | Status: DC
  Administered 2022-01-14 – 2022-01-16 (×4): 237 mL via ORAL

## 2022-01-14 MED ORDER — INSULIN ASPART 100 UNIT/ML IJ SOLN
2.0000 [IU] | Freq: Three times a day (TID) | INTRAMUSCULAR | Status: DC
Start: 2022-01-14 — End: 2022-01-15
  Administered 2022-01-14 – 2022-01-15 (×3): 2 [IU] via SUBCUTANEOUS

## 2022-01-14 MED ORDER — INSULIN ASPART 100 UNIT/ML IJ SOLN: 0.0000 [IU] | Freq: Every day | INTRAMUSCULAR | Status: DC

## 2022-01-14 NOTE — TOC Initial Note (Incomplete Revision)
Transition of Care [facility]) - Initial/Assessment Note    Patient Details  Name: Edward [name] MRN: [id number] Date of Birth: 02-Jun-1971  [facility] CM/SW Contact:    Angelita Ingles, RN Phone [phone]  01/14/2022, 12:53 PM  Clinical Narrative:                 51 y.o. male who was brought in to the Cath Lab for elective bilateral lower extremity angiogram which was done by Dr. Donzetta Matters to evaluate ongoing right BKA stump pain and necrosis. Patient underwent AKA this admission complicated by sepsis. CM at bedside to assess patient with high risk for readmission and home health needs. Patient states that he lives at home with his 2 sons where he uses DME wheelchair to assist with his day to day routines. Patient reports that he also has a cane and walker. Patient reports that his PCP is Dr. [name]. Patient reports that he is currently active and follows up with his physician frequently.. Patient reports that his pharmacy of choice is [facility] on [address]. Patient states that his medications are affordable and he has access to his medications. Patient has had home health in the past but is not sure of the name of the agency. CM offered patient choice for home health. Patient has no preference. TOC will continue to follow for disposition needs.   1323 Patient has previously had home health services with [place] office per records. CM has called referral to [facility] with [facility]. Acceptance pending.   [address] unable to accept [facility] referral. Referral has been submitted to [facility].   [facility] unable to accept referral due to patient due to amputation and possibly needing nurse for wound care. CM will continue to seek home health services.   Expected Discharge Plan: [place] Barriers to Discharge: Continued Medical Work up   Patient Goals and CMS Choice Patient states their goals for this hospitalization and  ongoing recovery are:: Wants to get better to go home CMS Medicare.gov Compare Post Acute Care list provided to:: Patient Choice offered to / list presented to : Patient  Expected Discharge Plan and Services Expected Discharge Plan: [place] In-house Referral: NA Discharge Planning Services: CM Consult Post Acute Care Choice: [place] arrangements for the past 2 months: Single Family Home                 DME Arranged: N/A DME Agency: NA                  Prior Living Arrangements/Services Living arrangements for the past 2 months: Single Family Home Lives with:: Adult Children Patient language and need for interpreter reviewed:: Yes Do you feel safe going back to the place where you live?: Yes      Need for Family Participation in Patient Care: Yes (Comment) Care giver support system in place?: Yes (comment) Current home services: DME (wheelchair, cane, walker) Criminal Activity/Legal Involvement Pertinent to Current Situation/Hospitalization: No - Comment as needed  Activities of Daily Living Home Assistive Devices/Equipment: Wheelchair, Prosthesis (Right BKA) ADL Screening (condition at time of admission) Independently performs ADLs?: Yes (appropriate for developmental age) Does the patient have difficulty walking or climbing stairs?: Yes (Right BKA) Weakness of Arms/Hands: None  Permission Sought/Granted Permission sought to share information with : Case Manager Permission granted to share information with : No  Emotional Assessment Appearance:: Appears stated age Attitude/Demeanor/Rapport: Gracious Affect (typically observed): Accepting, Pleasant Orientation: : Oriented to Self, Oriented to Place, Oriented to  Time, Oriented to Situation Alcohol / Substance Use: Not Applicable Psych Involvement: No (comment)  Admission diagnosis:  PAD (peripheral artery disease) ([place]) [I73.9] Nontraumatic ischemic infarction of muscle  of left lower leg [M62.262] Patient Active Problem List   Diagnosis Date Noted   Severe sepsis ([place])    Acute metabolic encephalopathy    PAD (peripheral artery disease) ([place]) 01/07/2022   Osteomyelitis of finger of left hand ([place]) 10/04/2021   Junctional rhythm 10/04/2021   Acute cholecystitis 09/27/2020   Elevated LFTs 09/27/2020   TIA (transient ischemic attack) 09/27/2020   Nausea and vomiting    ESRD on hemodialysis ([place]) 06/22/2020   S/P coronary artery stent placement 06/22/2020   Encounter for removal of sutures 03/07/2020   Fluid overload, unspecified 12/29/2019   Hypertensive chronic kidney disease with stage 1 through stage 4 chronic kidney disease, or unspecified chronic kidney disease 11/30/2019   Pruritus, unspecified 11/30/2019   Secondary hyperparathyroidism of renal origin ([place]) 11/30/2019   Unspecified atrial fibrillation ([place]) 11/30/2019   Pain, unspecified 11/30/2019   Other specified arthritis, unspecified site 11/30/2019   Iron deficiency anemia, unspecified 11/30/2019   End stage renal disease ([place]) 11/30/2019   Encounter for screening for respiratory tuberculosis 11/30/2019   Diarrhea, unspecified 11/30/2019   Coagulation defect, unspecified ([place]) 11/30/2019   Atherosclerotic heart disease of native coronary artery without angina pectoris 11/30/2019   Acute renal failure (ARF) ([place]) 11/22/2019   Transaminitis 12/22/2018   Acute exacerbation of CHF (congestive heart failure) ([place]) 12/21/2018   Gait abnormality 09/11/2018   CAD S/P percutaneous coronary angioplasty 08/06/2018   Cardiomyopathy ([place]) 08/06/2018   PVD (peripheral vascular disease) ([place]) 08/06/2018   Shortness of breath    Chronic combined systolic and diastolic CHF (congestive heart failure) ([place])    History of NSTEMI 07/28/2018   Respiratory failure with hypoxia ([place]) 07/28/2018   Acute heart failure ([place]) 07/28/2018   Hypertensive emergency 07/28/2018   S/P BKA (below knee amputation)  unilateral, right ([place]) 07/15/2018   Hypoglycemia    Anxiety about health    Labile blood glucose    Essential hypertension    History of transmetatarsal amputation of left foot ([place])    Hypoalbuminemia due to protein-calorie malnutrition ([place])    Poorly controlled type 2 diabetes mellitus with peripheral neuropathy ([place])    Acute blood loss anemia    Anemia of chronic disease    Leukocytosis    Tobacco abuse    Hyperkalemia 06/20/2018   Diabetic polyneuropathy associated with type 2 diabetes mellitus ([place])    Severe protein-calorie malnutrition ([place])    AKI (acute kidney injury) ([place]) 06/17/2018   Normocytic anemia 06/17/2018   Viral pharyngitis 01/14/2017   Cigarette smoker 01/14/2017   Avascular necrosis of hip, left ([place]) 10/11/2016   Status post left hip replacement 10/11/2016   S/P transmetatarsal amputation of foot, left ([place]) 07/25/2016   Balance problem 07/25/2016   Fournier's gangrene 08/16/2011   Acute respiratory failure ([place]) 08/16/2011   Septic shock(785.52) 08/16/2011   Diabetes mellitus with complication, with long-term current use of insulin ([place]) 08/16/2011   PCP:  [name], MD Pharmacy:   [facility] [place], [address] [place] [address] Phone: [phone] Fax: [phone]     Social Determinants of Health (  SDOH) Interventions    Readmission Risk Interventions    01/14/2022   12:26 PM  Readmission Risk Prevention Plan  Transportation Screening Complete  PCP or Specialist Appt within 3-5 Days Complete  HRI or [place] Complete  Social Work Consult for [place] Planning/Counseling Complete  Palliative Care Screening Not Applicable  Medication Review Press photographer) Referral to Pharmacy

## 2022-01-14 NOTE — Plan of Care (Signed)

## 2022-01-14 NOTE — Progress Notes (Signed)
Subjective: Seen in room no current complaints, postop pain is  controlled.  Next HD tomorrow Tuesday on schedule  Objective Vital signs in last 24 hours: Vitals:   01/14/22 0000 01/14/22 0336 01/14/22 0700 01/14/22 1115  BP: 130/74 129/77 139/83 124/74  Pulse: 75 70    Resp: '16 16 15 17  '$ Temp: 98 F (36.7 C) 98.1 F (36.7 C) 98.6 F (37 C) 98.6 F (37 C)  TempSrc: Oral Oral Oral Oral  SpO2: (!) 84% 95% 100% 100%  Weight:      Height:       Weight change:   Physical Exam: General: Alert, pleasant chronically ill-appearing in NAD Heart: RRR, no MRG Lungs: CTA B, nonlabored breathing on room air Abdomen: NABS soft NT ND Extremities: Right AKA trace edema, left TMA dressing dry clean, left hand dressing dry and clean Dialysis Access: Positive bruit left arm AV fistula  Out Patient dialysis Orders: TTS [facility]  4.5h  425/700  94kg  2/2 bath  LUA AVF   Heparin 8000+ 207mdrun prn - venofer 100 mg tiw IV , 5/04- 5/25 - mircera 30 ug q4 wks, last 5/4, due early June - hectorol 11 ug tiw IV - last HD 5/20, 93.3 > 92.0 kg - Hb 9.9 on 5/18      Problem/Plan: R BKA stump pain/ severe PAD/Possible cellulits + necrotic fingers- s/p AKA on 01/08/22 by Dr. CDonzetta Matters [facility] NGTD. Remains on vanc and meropenam.  And appears stable noted ,had "episode" on 5/24-  confusion/hypotension/fever-  now resolved-   ESRD - on HD TTS. next due Tuesday on schedule via AVF.  Ran on 3 k bath with K of 3.8 but K of 5.6/ on 528 this a.m. K4.4 Hyperkalemia - had resolved.-- but K of 5.6 AM 5/28- given Lokelma and resolved K4.4 this a.m. Severe PAD - sp L TMA, multiple amputations of L hand-status post 5/27 =left index finger amputation and right index, long ,and small digit amputation bilaterally by Dr. KFredna Dowtoday okay for discharge from pain standpoint follow-up in office later this week//VVS involved-  said if continues to have issues may need ligation of AVF.  CAD/ ICM / combined CHF - EF 40-45% DM2 - per  pmd BP/ vol - BP's soft to normal.  This a.m. BP stable, over EDW per bed weights. UF as tolerated.  Will need new dry weight not post AKA.--  weight of late seems more appropriate-   no BP meds Anemia esrd - Hb drop to 7.4 post surgery-  then 6.9. Hold iron d/t potential infection.  Order ESA -  got 100 mcg.   Transfuse prn for Hgb<7-  needed on 5/25. MBD ckd - Ca in range, phos elevated. Cont velphoro as binder and IV vdra.  Nutrition - renal diet w/fluid restrictions. Alb 2.5 - give protein supplements  DErnest Haber PA-C [facility] [phone]/29/2023,2:20 PM  LOS: 7 days   Labs: Basic Metabolic Panel: Recent Labs  Lab 01/08/22 0543 01/08/22 0656805/26/23 1755 01/12/22 0041 01/13/22 0055 01/14/22 0029  NA  --    < > 135 132* 133* 133*  K  --    < > 3.9 3.8 5.6* 4.4  CL  --    < > 95* 95* 96* 94*  CO2  --    < > '25 24 26 26  '$ GLUCOSE  --    < > 135* 153* 286* 297*  BUN  --    < > 23* 28*  25* 45*  CREATININE  --    < > 5.19* 5.81* 4.65* 6.62*  CALCIUM  --    < > 9.1 8.6* 8.7* 9.9  PHOS 9.1*  --  3.2  --   --  4.3   < > = values in this interval not displayed.   Liver Function Tests: Recent Labs  Lab 01/11/22 0239 01/11/22 1755 01/12/22 0041 01/13/22 0055 01/14/22 0029  AST 34  --  24 31  --   ALT 12  --  8 12  --   ALKPHOS 108  --  112 108  --   BILITOT 0.7  --  0.8 1.3*  --   PROT 6.8  --  6.3* 6.5  --   ALBUMIN 2.5*   < > 2.4* 2.4* 2.5*   < > = values in this interval not displayed.   No results for input(s): LIPASE, AMYLASE in the last 168 hours. No results for input(s): AMMONIA in the last 168 hours. CBC: Recent Labs  Lab 01/10/22 1201 01/11/22 0239 01/11/22 1755 01/12/22 0041 01/13/22 0055  WBC 11.8* 9.9 9.0 8.1 10.4  NEUTROABS  --  8.0*  --  6.3 9.6*  HGB 7.6* 7.9* 8.7* 7.8* 8.5*  HCT 22.6* 22.4* 26.2* 24.7* 26.2*  MCV 97.4 95.3 97.8 98.4 98.5  PLT 214 241 281 263 297   Cardiac Enzymes: No results for input(s): CKTOTAL,  CKMB, CKMBINDEX, TROPONINI in the last 168 hours. CBG: Recent Labs  Lab 01/13/22 1111 01/13/22 1612 01/13/22 2142 01/14/22 0604 01/14/22 1113  GLUCAP 309* 322* 339* 249* 128*    Studies/Results: No results found. Medications:  sodium chloride     sodium chloride     sodium chloride 250 mL (01/12/22 2356)   meropenem (MERREM) IV 500 mg (01/13/22 2020)   [START ON 01/15/2022] vancomycin      aspirin EC  81 mg Oral Daily   atorvastatin  80 mg Oral Daily   clopidogrel  75 mg Oral Daily   darbepoetin (ARANESP) injection - DIALYSIS  100 mcg Intravenous Q Thu-HD   doxercalciferol  11 mcg Intravenous Q T,Th,Sa-HD   ezetimibe  10 mg Oral Daily   gabapentin  100 mg Oral BID   heparin  5,000 Units Subcutaneous Q8H   insulin aspart  0-5 Units Subcutaneous QHS   insulin aspart  0-9 Units Subcutaneous TID WC   insulin aspart  2 Units Subcutaneous TID WC   insulin aspart protamine- aspart  25 Units Subcutaneous BID WC   linaclotide  72 mcg Oral QAC breakfast   pantoprazole  40 mg Oral BID   polyethylene glycol  17 g Oral Daily   sodium zirconium cyclosilicate  10 g Oral Daily   sucroferric oxyhydroxide  1,000 mg Oral TID WC

## 2022-01-14 NOTE — Progress Notes (Signed)
PROGRESS NOTE                                                                                                                                                                                                             Patient Demographics:    Edward [name], is a 51 y.o. male, DOB - 1970/11/02, SLH:[id number]  Outpatient Primary MD for the patient is [name], MD    LOS - 7  Admit date - 01/07/2022    No chief complaint on file.      Brief Narrative (HPI from H&P)    51 y.o. male, history of severe PAD s/p left transmetatarsal amputation, right BKA with necrotic stump, multiple amputation in left hand fingers, ESRD on TTS schedule for dialysis, DM type 2 insulin-dependent, dyslipidemia, CAD with MI in past, combined systolic and diastolic CHF EF 15%, orthostatic hypotension of chronic disease, AAA, recent Zio patch placement, who was brought in to the Cath Lab for elective bilateral lower extremity angiogram which was done by Dr. Donzetta Matters to evaluate ongoing right BKA stump pain and necrosis, postprocedure hospitalist team was requested to admit the patient as he may require further revision of his right lower extremity stump and underwent AKA this admission complicated by sepsis.   Subjective:   Patient in bed, appears comfortable, denies any headache, no fever, no chest pain or pressure, no shortness of breath , no abdominal pain. No focal weakness.   Assessment  & Plan :   1.  Right BKA stump site discomfort, history of severe PAD, with a right BKA stump necrosis and post AKA Sepsis.  He has been seen by critical care and vascular surgery, on appropriate IV antibiotics with negative blood cultures, source of infection could be right leg BKA stump infection which eventually required revision into AKA, also see #2 below for hand surgery.  Sepsis pathophysiology has resolved, continue IV antibiotics for a total of 5-7 days.  Monitor  cultures closely.  Blood cultures thus far negative till 01/14/2022.  For PAD continue dual antiplatelet therapy along with statin and Zetia.  Vascular surgery is following his postop stump site appears clean.   2.  Severe PAD requiring right BKA and now right AKA, left transmetatarsal amputation, multiple amputations of left upper extremity fingers - for PAD DAPT, Zetia and statin for secondary prevention.  Vascular  surgery on board.  Hand surgeon Dr. Fredna Dow also on board patient underwent on 01/12/2022 -  1.  Left index finger amputation at MP joint 2.  Right index finger amputation through proximal phalanx 3.  Right long finger amputation through proximal phalanx 4.  Right small finger amputation through PIP joint    3.  ESRD.  On TTS schedule, has left arm AV fistula, renal consulted.   4.  CAD with ischemic cardiomyopathy.  No acute issues supportive care, continue DAPT with statin for secondary prevention, has chronic orthostatic hypotension precluding the use of beta-blockers.   5.  Chronic combined systolic and diastolic CHF with EF 27%.  Currently compensated, fluid removal via HD, blood pressure too low to allow beta-blocker, ACE/ARB use.   6.  Dyslipidemia.  LDL above goal on max dose statin and Zetia added.   7.  Anemia of chronic disease with some perioperative blood loss related anemia along with heme dilution from IV fluids.  Due to ESRD.  On iron supplementation with HD treatments, received 1 unit of packed RBC early morning 01/10/2022, stable posttransfusion H&H no signs of active ongoing bleeding, on PPI as well.  He may require another unit of transfusion on 01/13/2022 based on his surgical blood loss on 01/12/2022.  8.  Hypokalemia in ESRD patient.  Gentle replacement.   9. DM type II.  With improved oral intake CBGs have improved and running slightly high, insulin adjusted on 01/14/2022 for better control.  Lab Results  Component Value Date   HGBA1C 6.5 (H) 01/07/2022    CBG (last 3)  Recent Labs    01/13/22 1612 01/13/22 2142 01/14/22 0604  GLUCAP 322* 339* 249*          Condition - Extremely Guarded  Family Communication  :    Called Son Fannie Knee [phone] - left message 01/09/22 - 4.50 pm  Code Status :  Full  Consults  :  Renal, VVS, PCCM  PUD Prophylaxis : PPI   Procedures  :     Dr. Fredna Dow hand surgeon on 01/12/2022 -  PROCEDURE:  1.  Left index finger amputation at MP joint 2.  Right index finger amputation through proximal phalanx 3.  Right long finger amputation through proximal phalanx 4.  Right small finger amputation through PIP joint  Right AKA on 01/08/2022      Disposition Plan  :    Status is: Inpatient  DVT Prophylaxis  :    heparin injection 5,000 Units Start: 01/07/22 2200  Lab Results  Component Value Date   PLT 297 01/13/2022    Diet :  Diet Order             Diet renal with fluid restriction Fluid restriction: 1200 mL Fluid; Room service appropriate? Yes; Fluid consistency: Thin  Diet effective now                    Inpatient Medications  Scheduled Meds:  aspirin EC  81 mg Oral Daily   atorvastatin  80 mg Oral Daily   Chlorhexidine Gluconate Cloth  6 each Topical Q0600   clopidogrel  75 mg Oral Daily   darbepoetin (ARANESP) injection - DIALYSIS  100 mcg Intravenous Q Thu-HD   doxercalciferol  11 mcg Intravenous Q T,Th,Sa-HD   ezetimibe  10 mg Oral Daily   gabapentin  100 mg Oral BID   heparin  5,000 Units Subcutaneous Q8H   insulin aspart  0-5 Units Subcutaneous QHS   insulin aspart  0-9 Units Subcutaneous TID WC   insulin aspart  2 Units Subcutaneous TID WC   insulin aspart protamine- aspart  25 Units Subcutaneous BID WC   linaclotide  72 mcg Oral QAC breakfast   pantoprazole  40 mg Oral BID   polyethylene glycol  17 g Oral Daily   sodium zirconium cyclosilicate  10 g Oral Daily   sucroferric oxyhydroxide  1,000 mg Oral TID WC   Continuous Infusions:  sodium chloride      sodium chloride     sodium chloride 250 mL (01/12/22 2356)   meropenem (MERREM) IV 500 mg (01/13/22 2020)   [START ON 01/15/2022] vancomycin     PRN Meds:.sodium chloride, sodium chloride, alteplase, bisacodyl, bismuth subsalicylate, fentaNYL (SUBLIMAZE) injection, heparin, heparin, hydrALAZINE, labetalol, lidocaine (PF), lidocaine-prilocaine, naLOXone (NARCAN)  injection, ondansetron (ZOFRAN) IV, [DISCONTINUED] ondansetron **OR** ondansetron (ZOFRAN) IV, oxyCODONE, pentafluoroprop-tetrafluoroeth, traMADol  Time Spent in minutes  30   Lala Lund M.D on 01/14/2022 at 8:39 AM  To page go to [email or web address]   [facility] -  Office  [phone]  See all Orders from today for further details    Objective:   Vitals:   01/13/22 1944 01/14/22 0000 01/14/22 0336 01/14/22 0700  BP: 129/73 130/74 129/77 139/83  Pulse: 75 75 70   Resp: '20 16 16 15  '$ Temp: 98.1 F (36.7 C) 98 F (36.7 C) 98.1 F (36.7 C) 98.6 F (37 C)  TempSrc: Oral Oral Oral Oral  SpO2: 98% (!) 84% 95% 100%  Weight:      Height:        Wt Readings from Last 3 Encounters:  01/12/22 89.2 kg  12/26/21 103.9 kg  11/28/21 104 kg     Intake/Output Summary (Last 24 hours) at 01/14/2022 0839 Last data filed at 01/13/2022 2020 Gross per 24 hour  Intake 500 ml  Output --  Net 500 ml      Physical Exam  Awake Alert, No new F.N deficits, Normal affect [place].AT,PERRAL Supple Neck, No JVD,   Symmetrical Chest wall movement, Good air movement bilaterally, CTAB RRR,No Gallops, Rubs or new Murmurs,  +ve B.Sounds, Abd Soft, No tenderness,   Right AKA stump site under bandage, right femoral cath site stable under bandage, left transmetatarsal amputation old, L hand 3 fingers, R hand 2 fingers, both hands partial digit amputation sites under bandage, left arm AV fistula present    Data Review:    CBC Recent Labs  Lab 01/09/22 0309 01/09/22 2245 01/10/22 0639 01/10/22 1201 01/11/22 0239 01/11/22 1755  01/12/22 0041 01/13/22 0055  WBC 12.2*   < > 10.5 11.8* 9.9 9.0 8.1 10.4  HGB 7.4*   < > 7.5* 7.6* 7.9* 8.7* 7.8* 8.5*  HCT 22.4*   < > 22.6* 22.6* 22.4* 26.2* 24.7* 26.2*  PLT 225   < > 201 214 241 281 263 297  MCV 99.6   < > 97.8 97.4 95.3 97.8 98.4 98.5  MCH 32.9   < > 32.5 32.8 33.6 32.5 31.1 32.0  MCHC 33.0   < > 33.2 33.6 35.3 33.2 31.6 32.4  RDW 15.9*   < > 17.0* 17.0* 16.8* 16.7* 16.4* 16.3*  LYMPHSABS 0.6*  --  0.6*  --  0.8  --  0.7 0.4*  MONOABS 1.0  --  0.9  --  0.6  --  0.7 0.3  EOSABS 0.2  --  0.1  --  0.3  --  0.3 0.0  BASOSABS 0.0  --  0.0  --  0.0  --  0.0 0.0   < > = values in this interval not displayed.    Electrolytes Recent Labs  Lab 01/07/22 1822 01/08/22 0543 01/08/22 0354 01/08/22 1314 01/09/22 0309 01/09/22 1803 01/09/22 2245 01/10/22 6568 01/11/22 0239 01/11/22 1755 01/12/22 0041 01/13/22 0055 01/14/22 0029  NA  --   --  132*   < > 134*  --   --  135 136 135 132* 133* 133*  K  --   --  5.5*   < > 4.2  --   --  4.0 2.9* 3.9 3.8 5.6* 4.4  CL  --   --  86*   < > 96*  --   --  95* 97* 95* 95* 96* 94*  CO2  --    < > 21*  --  24  --   --  '22 27 25 24 26 26  '$ GLUCOSE  --   --  102*   < > 122*  --   --  88 94 135* 153* 286* 297*  BUN  --   --  55*   < > 26*  --   --  39* 14 23* 28* 25* 45*  CREATININE 8.60*  --  9.74*   < > 6.00*  --   --  8.17* 3.31* 5.19* 5.81* 4.65* 6.62*  CALCIUM  --    < > 9.4  --  8.6*  --   --  8.8* 8.6* 9.1 8.6* 8.7* 9.9  AST  --   --   --   --  35  --   --  48* 34  --  24 31  --   ALT  --   --   --   --  17  --   --  15 12  --  8 12  --   ALKPHOS  --   --   --   --  70  --   --  80 108  --  112 108  --   BILITOT  --   --   --   --  1.0  --   --  1.5* 0.7  --  0.8 1.3*  --   ALBUMIN  --    < >  --   --  2.5*  --   --  2.5* 2.5* 2.7* 2.4* 2.4* 2.5*  MG  --   --   --   --  2.0  --   --  2.1 2.1  --  2.0  --   --   CRP  --   --  19.7*  --  21.8*  --   --  25.5* 27.8*  --  21.3*  --   --   PROCALCITON  --    < > 1.16  --  8.75   --   --  13.89 15.25  --  11.14 7.00  --   LATICACIDVEN  --   --   --   --   --  5.8* 1.6  --   --   --   --   --   --   HGBA1C 6.5*  --   --   --   --   --   --   --   --   --   --   --   --    < > = values in this interval not displayed.    ------------------------------------------------------------------------------------------------------------------ No results for input(s): CHOL,  HDL, LDLCALC, TRIG, CHOLHDL, LDLDIRECT in the last 72 hours.   Lab Results  Component Value Date   HGBA1C 6.5 (H) 01/07/2022   Signature  Lala Lund M.D on 01/14/2022 at 8:39 AM   -  To page go to [email or web address]

## 2022-01-14 NOTE — Progress Notes (Signed)
Physical Therapy Treatment Patient Details Name: Edward [name] MRN: [id number] DOB: May 01, 1971 Today's Date: 01/14/2022   History of Present Illness Pt is a 51 y.o. 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 S/p transition of R BKA to R AKA on 5/23. Course complicated by hypotension, acute metabolic encephalopathy. PMH includes PAD, multiple finger amputations, ESRD (HD TTS), DM2, CAD, CHF, AAA, orthostatic hypotension.    PT Comments    Patient progressing slowly towards PT goals. Reports pain in bil hands and right residual limb impacting mobility this afternoon. Session focused on lateral scoots along side the bed to simulate transfer OOB as well as there ex of right residual limb/LLE and balance exercises reaching outside BoS. Limited by pain in hands. Reviewed desensitization techniques of right residual limb. Plans to d/c home with support of sons. Will follow and progress as tolerated.   Recommendations for follow up therapy are one component of a multi-disciplinary discharge planning process, led by the attending physician.  Recommendations may be updated based on patient status, additional functional criteria and insurance authorization.  Follow Up Recommendations  Home health PT     Assistance Recommended at Discharge Intermittent Supervision/Assistance  Patient can return home with the following A lot of help with walking and/or transfers;A lot of help with bathing/dressing/bathroom;Assistance with cooking/housework;Assist for transportation;Help with stairs or ramp for entrance   Equipment Recommendations  None recommended by PT    Recommendations for Other Services       Precautions / Restrictions Precautions Precautions: Fall Restrictions Weight Bearing Restrictions: Yes RLE Weight Bearing: Non weight bearing Other Position/Activity Restrictions: multiple amputations of LE and UE.      Mobility  Bed Mobility               General bed mobility comments: Sitting EOB upon PT arrival.    Transfers Overall transfer level: Needs assistance Equipment used: None Transfers: Bed to chair/wheelchair/BSC            Lateral/Scoot Transfers: Min guard General transfer comment: Worked on laterally scooting along side bed to simulate transfer towards left however pt declining transfer OOB due to pain. Performed scoots x4.    Ambulation/Gait                   Stairs             Wheelchair Mobility    Modified Rankin (Stroke Patients Only)       Balance Overall balance assessment: Needs assistance Sitting-balance support: Feet supported, No upper extremity supported (foot) Sitting balance-Leahy Scale: Good Sitting balance - Comments: Worked on reaching balance activities outside BoS with BUEs inall directions wtihout LOB or difficulty.                                    Cognition Arousal/Alertness: Awake/alert Behavior During Therapy: WFL for tasks assessed/performed Overall Cognitive Status: Within Functional Limits for tasks assessed                                 General Comments: aware of deficits        Exercises General Exercises - Lower Extremity Long Arc Quad: Strengthening, Left, 5 reps, Seated Hip Flexion/Marching: Strengthening, Left, 5 reps, Seated Amputee Exercises Hip Extension: Strengthening, Right, 5 reps, Seated  Hip ABduction/ADduction: Strengthening, Right, 5 reps, Seated Hip Flexion/Marching: Strengthening, Right, 5 reps, Seated    General Comments General comments (skin integrity, edema, etc.): VSS on RA.      Pertinent Vitals/Pain Pain Assessment Pain Assessment: Faces Faces Pain Scale: Hurts even more Pain Location: Rt residual limb and Bil hands Pain Descriptors / Indicators: Sore, Discomfort Pain Intervention(s): Monitored during session, Limited activity within patient's  tolerance    Home Living                          Prior Function            PT Goals (current goals can now be found in the care plan section) Progress towards PT goals: Not progressing toward goals - comment (secondary to pain)    Frequency    Min 3X/week      PT Plan Current plan remains appropriate    Co-evaluation              AM-PAC PT "6 Clicks" Mobility   Outcome Measure  Help needed turning from your back to your side while in a flat bed without using bedrails?: A Little Help needed moving from lying on your back to sitting on the side of a flat bed without using bedrails?: A Little Help needed moving to and from a bed to a chair (including a wheelchair)?: A Lot Help needed standing up from a chair using your arms (e.g., wheelchair or bedside chair)?: Total Help needed to walk in hospital room?: Total Help needed climbing 3-5 steps with a railing? : Total 6 Click Score: 11    End of Session   Activity Tolerance: Patient tolerated treatment well;Patient limited by pain Patient left: in bed;with call bell/phone within reach (sitting EOB) Nurse Communication: Mobility status PT Visit Diagnosis: Muscle weakness (generalized) (M62.81);Unsteadiness on feet (R26.81);Difficulty in walking, not elsewhere classified (R26.2);Pain Pain - Right/Left: Right Pain - part of body: Leg (bil hands)     Time: 8756-4332 PT Time Calculation (min) (ACUTE ONLY): 13 min  Charges:  $Therapeutic Exercise: 8-22 mins                     Marisa Severin, PT, DPT Acute Rehabilitation Services Secure chat preferred Office Walker 01/14/2022, 4:34 PM

## 2022-01-14 NOTE — Progress Notes (Signed)
  Progress Note    01/14/2022 10:02 AM 2 Days Post-Op  Subjective: Feeling much better today  Vitals:   01/14/22 0336 01/14/22 0700  BP: 129/77 139/83  Pulse: 70   Resp: 16 15  Temp: 98.1 F (36.7 C) 98.6 F (37 C)  SpO2: 95% 100%    Physical Exam: Awake alert oriented Left arm fistula with strong thrill Bilateral hands with dressing clean dry intact Unna boot to left lower extremity Right above-knee amputation site healing well with staples in place  CBC    Component Value Date/Time   WBC 10.4 01/13/2022 0055   RBC 2.66 (L) 01/13/2022 0055   HGB 8.5 (L) 01/13/2022 0055   HCT 26.2 (L) 01/13/2022 0055   PLT 297 01/13/2022 0055   MCV 98.5 01/13/2022 0055   MCH 32.0 01/13/2022 0055   MCHC 32.4 01/13/2022 0055   RDW 16.3 (H) 01/13/2022 0055   LYMPHSABS 0.4 (L) 01/13/2022 0055   MONOABS 0.3 01/13/2022 0055   EOSABS 0.0 01/13/2022 0055   BASOSABS 0.0 01/13/2022 0055    BMET    Component Value Date/Time   NA 133 (L) 01/14/2022 0029   NA 140 04/30/2019 1507   K 4.4 01/14/2022 0029   CL 94 (L) 01/14/2022 0029   CO2 26 01/14/2022 0029   GLUCOSE 297 (H) 01/14/2022 0029   BUN 45 (H) 01/14/2022 0029   BUN 37 (H) 04/30/2019 1507   CREATININE 6.62 (H) 01/14/2022 0029   CREATININE 1.70 (H) 06/15/2018 1458   CALCIUM 9.9 01/14/2022 0029   GFRNONAA 9 (L) 01/14/2022 0029   GFRAA 22 (L) 11/26/2019 1151    INR    Component Value Date/Time   INR 1.2 09/28/2020 0340     Intake/Output Summary (Last 24 hours) at 01/14/2022 1002 Last data filed at 01/13/2022 2020 Gross per 24 hour  Intake 500 ml  Output --  Net 500 ml     Assessment/plan:  51 y.o. male is s/p right above-knee amputation now status post finger amputations bilateral hands also has healing wounds of the left lower extremity.  He underwent angiography to evaluate bilateral upper and lower extremities during this hospitalization and there is no intervention necessary at this time.  In the future he might  require left lower extremity revascularization if his wound does not heal.  Given his other issues we are not pursuing this currently and he is okay for discharge from a vascular standpoint we will follow him up to remove staples in a few weeks.     Zyonna Vardaman C. Donzetta Matters, MD [facility] Office: [phone] Pager: [phone]  01/14/2022 10:02 AM

## 2022-01-14 NOTE — TOC Initial Note (Addendum)
Transition of Care [facility]) - Initial/Assessment Note    Patient Details  Name: Edward [name] MRN: [id number] Date of Birth: [birth year]  Transition of Care [facility]) CM/SW Contact:    Angelita Ingles, RN Phone Number:[phone]  01/14/2022, 12:53 PM  Clinical Narrative:                 51 y.o. male who was brought in to the Cath Lab for elective bilateral lower extremity angiogram which was done by Dr. Donzetta Matters to evaluate ongoing right BKA stump pain and necrosis. Patient underwent AKA this admission complicated by sepsis. CM at bedside to assess patient with high risk for readmission and home health needs. Patient states that he lives at home with his 2 sons where he uses DME wheelchair to assist with his day to day routines. Patient reports that he also has a cane and walker. Patient reports that his PCP is Dr. [name]. Patient reports that he is currently active and follows up with his physician frequently.. Patient reports that his pharmacy of choice is [facility] on [address]. Patient states that his medications are affordable and he has access to his medications. Patient has had home health in the past but is not sure of the name of the agency. CM offered patient choice for home health. Patient has no preference. TOC will continue to follow for disposition needs.   1323 Patient has previously had home health services with [facility] per records. CM has called referral to [facility] with [facility]. Acceptance pending.   [facility] unable to accept [facility] referral. Referral has been submitted to [facility]   Expected Discharge Plan: [place] Barriers to Discharge: Continued Medical Work up   Patient Goals and CMS Choice Patient states their goals for this hospitalization and ongoing recovery are:: Wants to get better to go home CMS Medicare.gov Compare Post Acute Care list provided to:: Patient Choice offered to / list presented to :  Patient  Expected Discharge Plan and Services Expected Discharge Plan: [place] In-house Referral: NA Discharge Planning Services: CM Consult Post Acute Care Choice: [place] arrangements for the past 2 months: Single Family Home                 DME Arranged: N/A DME Agency: NA                  Prior Living Arrangements/Services Living arrangements for the past 2 months: Single Family Home Lives with:: Adult Children Patient language and need for interpreter reviewed:: Yes Do you feel safe going back to the place where you live?: Yes      Need for Family Participation in Patient Care: Yes (Comment) Care giver support system in place?: Yes (comment) Current home services: DME (wheelchair, cane, walker) Criminal Activity/Legal Involvement Pertinent to Current Situation/Hospitalization: No - Comment as needed  Activities of Daily Living Home Assistive Devices/Equipment: Wheelchair, Prosthesis (Right BKA) ADL Screening (condition at time of admission) Independently performs ADLs?: Yes (appropriate for developmental age) Does the patient have difficulty walking or climbing stairs?: Yes (Right BKA) Weakness of Arms/Hands: None  Permission Sought/Granted Permission sought to share information with : Case Manager Permission granted to share information with : No              Emotional Assessment Appearance:: Appears stated age Attitude/Demeanor/Rapport: Gracious Affect (typically observed): Accepting, Pleasant Orientation: : Oriented to Self, Oriented to Place, Oriented to  Time, Oriented to Situation Alcohol / Substance Use: Not Applicable Psych Involvement: No (comment)  Admission diagnosis:  PAD (peripheral artery disease) (HCC) [I73.9] Nontraumatic ischemic infarction of muscle of left lower leg [M62.262] Patient Active Problem List   Diagnosis Date Noted   Severe sepsis ([place])    Acute metabolic encephalopathy    PAD (peripheral  artery disease) ([place]) 01/07/2022   Osteomyelitis of finger of left hand ([place]) 10/04/2021   Junctional rhythm 10/04/2021   Acute cholecystitis 09/27/2020   Elevated LFTs 09/27/2020   TIA (transient ischemic attack) 09/27/2020   Nausea and vomiting    ESRD on hemodialysis ([place]) 06/22/2020   S/P coronary artery stent placement 06/22/2020   Encounter for removal of sutures 03/07/2020   Fluid overload, unspecified 12/29/2019   Hypertensive chronic kidney disease with stage 1 through stage 4 chronic kidney disease, or unspecified chronic kidney disease 11/30/2019   Pruritus, unspecified 11/30/2019   Secondary hyperparathyroidism of renal origin ([place]) 11/30/2019   Unspecified atrial fibrillation ([place]) 11/30/2019   Pain, unspecified 11/30/2019   Other specified arthritis, unspecified site 11/30/2019   Iron deficiency anemia, unspecified 11/30/2019   End stage renal disease ([place]) 11/30/2019   Encounter for screening for respiratory tuberculosis 11/30/2019   Diarrhea, unspecified 11/30/2019   Coagulation defect, unspecified ([place]) 11/30/2019   Atherosclerotic heart disease of native coronary artery without angina pectoris 11/30/2019   Acute renal failure (ARF) ([place]) 11/22/2019   Transaminitis 12/22/2018   Acute exacerbation of CHF (congestive heart failure) ([place]) 12/21/2018   Gait abnormality 09/11/2018   CAD S/P percutaneous coronary angioplasty 08/06/2018   Cardiomyopathy ([place]) 08/06/2018   PVD (peripheral vascular disease) ([place]) 08/06/2018   Shortness of breath    Chronic combined systolic and diastolic CHF (congestive heart failure) ([place])    History of NSTEMI 07/28/2018   Respiratory failure with hypoxia ([place]) 07/28/2018   Acute heart failure ([place]) 07/28/2018   Hypertensive emergency 07/28/2018   S/P BKA (below knee amputation) unilateral, right ([place]) 07/15/2018   Hypoglycemia    Anxiety about health    Labile blood glucose    Essential hypertension    History of transmetatarsal  amputation of left foot ([place])    Hypoalbuminemia due to protein-calorie malnutrition ([place])    Poorly controlled type 2 diabetes mellitus with peripheral neuropathy ([place])    Acute blood loss anemia    Anemia of chronic disease    Leukocytosis    Tobacco abuse    Hyperkalemia 06/20/2018   Diabetic polyneuropathy associated with type 2 diabetes mellitus ([place])    Severe protein-calorie malnutrition ([place])    AKI (acute kidney injury) ([place]) 06/17/2018   Normocytic anemia 06/17/2018   Viral pharyngitis 01/14/2017   Cigarette smoker 01/14/2017   Avascular necrosis of hip, left ([place]) 10/11/2016   Status post left hip replacement 10/11/2016   S/P transmetatarsal amputation of foot, left ([place]) 07/25/2016   Balance problem 07/25/2016   Fournier's gangrene 08/16/2011   Acute respiratory failure ([place]) 08/16/2011   Septic shock(785.52) 08/16/2011   Diabetes mellitus with complication, with long-term current use of insulin ([place]) 08/16/2011   PCP:  [name], MD Pharmacy:   [facility] [place], [address] AT [address] [place] [address] Phone: [phone] Fax: [phone]     Social Determinants of Health (SDOH) Interventions    Readmission Risk Interventions    01/14/2022   12:26 PM  Readmission Risk Prevention Plan  Transportation Screening  Complete  PCP or Specialist Appt within 3-5 Days Complete  HRI or Home Care Consult Complete  Social Work Consult for [place] Planning/Counseling Complete  Palliative Care Screening Not Applicable  Medication Review Press photographer) Referral to Pharmacy

## 2022-01-14 NOTE — Progress Notes (Signed)
Subjective: 2 Days Post-Op Procedure(s) (LRB): LEFT INDEX FINGER AMPUTATION, RIGHT INDEX, LONG AND SMALL DIGITS AMPUTATION (Bilateral) Patient reports pain as minimal.  Objective: Vital signs in last 24 hours: Temp:  [97.7 F (36.5 C)-98.6 F (37 C)] 98.6 F (37 C) (05/29 1115) Pulse Rate:  [70-75] 70 (05/29 0336) Resp:  [15-20] 17 (05/29 1115) BP: (111-139)/(65-83) 124/74 (05/29 1115) SpO2:  [84 %-100 %] 100 % (05/29 1115)  Intake/Output from previous day: 05/28 0701 - 05/29 0700 In: 500 [P.O.:500] Out: -  Intake/Output this shift: No intake/output data recorded.  Recent Labs    01/11/22 1755 01/12/22 0041 01/13/22 0055  HGB 8.7* 7.8* 8.5*   Recent Labs    01/12/22 0041 01/13/22 0055  WBC 8.1 10.4  RBC 2.51* 2.66*  HCT 24.7* 26.2*  PLT 263 297   Recent Labs    01/13/22 0055 01/14/22 0029  NA 133* 133*  K 5.6* 4.4  CL 96* 94*  CO2 26 26  BUN 25* 45*  CREATININE 4.65* 6.62*  GLUCOSE 286* 297*  CALCIUM 8.7* 9.9   No results for input(s): LABPT, INR in the last 72 hours.  Dressings c/d/i   Assessment/Plan: 2 Days Post-Op Procedure(s) (LRB): LEFT INDEX FINGER AMPUTATION, RIGHT INDEX, LONG AND SMALL DIGITS AMPUTATION (Bilateral) Discussed amputation levels with patient.  States his hands are comfortable with expected post surgical pain.  Keep dressings clean/intact.  Okay to change if they become dirty or come off.  Follow up in office later this week or early following.  Okay for d/c from hand standpoint.   [place] Cover 01/14/2022, 1:30 PM

## 2022-01-14 NOTE — Care Management Important Message (Signed)
Important Message  Patient Details  Name: Edward [name] MRN: [id number] Date of Birth: 1970/11/17   Medicare Important Message Given:  Yes     Amalia Edgecombe 01/14/2022, 1:45 PM

## 2022-01-14 NOTE — Care Management Important Message (Signed)
Important Message  Patient Details  Name: Edward [name] MRN: [id number] Date of Birth: 1971/06/07   Medicare Important Message Given:  Yes     Jatziry Wechter 01/14/2022, 1:45 PM

## 2022-01-14 NOTE — Progress Notes (Signed)
Occupational Therapy Treatment Patient Details Name: Edward [name] MRN: [id number] DOB: 08/11/1971 Today's Date: 01/14/2022   History of present illness Pt is a 51 y.o. 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 S/p transition of R BKA to R AKA on 5/23. Course complicated by hypotension, acute metabolic encephalopathy. PMH includes PAD, multiple finger amputations, ESRD (HD TTS), DM2, CAD, CHF, AAA, orthostatic hypotension.   OT comments  Patient pleasant and eager to participate with OT treatment. Patient performed grooming tasks seated on EOB with assistance to open containers and setup.  Toilet transfers address with squat pivot transfers to/from recliner to/from EOB with mod assist. Patient is eager to progress with OT and would benefit from continued [place] once discharged from this location. Acute OT to continue to follow.    Recommendations for follow up therapy are one component of a multi-disciplinary discharge planning process, led by the attending physician.  Recommendations may be updated based on patient status, additional functional criteria and insurance authorization.    Follow Up Recommendations  Home health OT    Assistance Recommended at Discharge Intermittent Supervision/Assistance  Patient can return home with the following  A little help with walking and/or transfers;A little help with bathing/dressing/bathroom;Help with stairs or ramp for entrance;Assist for transportation   Equipment Recommendations  None recommended by OT    Recommendations for Other Services      Precautions / Restrictions Precautions Precautions: Fall Restrictions Weight Bearing Restrictions: Yes RLE Weight Bearing: Non weight bearing Other Position/Activity Restrictions: multiple amputations of LE and UE.       Mobility Bed Mobility                    Transfers Overall transfer level: Needs  assistance Equipment used: None Transfers: Bed to chair/wheelchair/BSC     Squat pivot transfers: Mod assist       General transfer comment: Mod assist for squat pivot transfer to and from recliner to and from EOB to simulate [facility] transfers.  Unable to push up to stand due to multiple finger amputations     Balance Overall balance assessment: Needs assistance Sitting-balance support: No upper extremity supported Sitting balance-Leahy Scale: Fair Sitting balance - Comments: able to sit on EOB to perform grooming tasks     Standing balance-Leahy Scale: Poor Standing balance comment: only performed squat pivot transfers                           ADL either performed or assessed with clinical judgement   ADL Overall ADL's : Needs assistance/impaired     Grooming: Wash/dry hands;Wash/dry face;Oral care;Minimal assistance;Sitting Grooming Details (indicate cue type and reason): patient required assistance to open toothpaste                 Toilet Transfer: Moderate assistance;Squat-pivot Toilet Transfer Details (indicate cue type and reason): simulated to recliner and back to EOB           General ADL Comments: difficulty using UEs due to multiple finger amputations.    Extremity/Trunk Assessment              Vision       Perception     Praxis      Cognition Arousal/Alertness: Awake/alert Behavior During Therapy: WFL for tasks assessed/performed Overall Cognitive Status: Within Functional Limits for tasks assessed  General Comments: aware of deficits        Exercises      Shoulder Instructions       General Comments      Pertinent Vitals/ Pain       Pain Assessment Pain Assessment: Faces Faces Pain Scale: Hurts a little bit Pain Location: R residual limb Pain Descriptors / Indicators: Sore Pain Intervention(s): Monitored during session  Home Living                                           Prior Functioning/Environment              Frequency  Min 2X/week        Progress Toward Goals  OT Goals(current goals can now be found in the care plan section)  Progress towards OT goals: Progressing toward goals  Acute Rehab OT Goals OT Goal Formulation: Patient unable to participate in goal setting Time For Goal Achievement: 01/23/22 Potential to Achieve Goals: Good ADL Goals Pt Will Perform Grooming: with set-up;standing;sitting Pt Will Perform Upper Body Dressing: with set-up;sitting Pt Will Perform Lower Body Dressing: with set-up;sitting/lateral leans Pt Will Transfer to Toilet: with supervision;squat pivot transfer;regular height toilet;bedside commode  Plan Discharge plan remains appropriate    Co-evaluation                 AM-PAC OT "6 Clicks" Daily Activity     Outcome Measure   Help from another person eating meals?: None Help from another person taking care of personal grooming?: None Help from another person toileting, which includes using toliet, bedpan, or urinal?: A Lot Help from another person bathing (including washing, rinsing, drying)?: A Lot Help from another person to put on and taking off regular upper body clothing?: A Little Help from another person to put on and taking off regular lower body clothing?: A Lot 6 Click Score: 17    End of Session Equipment Utilized During Treatment: Gait belt  OT Visit Diagnosis: Unsteadiness on feet (R26.81);Pain Pain - Right/Left: Right Pain - part of body: Leg   Activity Tolerance Patient tolerated treatment well   Patient Left in bed;with call bell/phone within reach   Nurse Communication Mobility status        Time: 1210-1239 OT Time Calculation (min): 29 min  Charges: OT General Charges $OT Visit: 1 Visit OT Treatments $Self Care/Home Management : 23-37 mins  Lodema Hong, [place]  Pager [phone] Office  [place] 01/14/2022, 2:38 PM

## 2022-01-15 ENCOUNTER — Encounter (HOSPITAL_COMMUNITY): Payer: Self-pay | Admitting: Internal Medicine

## 2022-01-15 DIAGNOSIS — I2510 Atherosclerotic heart disease of native coronary artery without angina pectoris: Secondary | ICD-10-CM

## 2022-01-15 DIAGNOSIS — Z9861 Coronary angioplasty status: Secondary | ICD-10-CM

## 2022-01-15 DIAGNOSIS — I739 Peripheral vascular disease, unspecified: Secondary | ICD-10-CM | POA: Diagnosis not present

## 2022-01-15 DIAGNOSIS — I441 Atrioventricular block, second degree: Secondary | ICD-10-CM

## 2022-01-15 LAB — TSH: TSH: 1.581 u[IU]/mL (ref 0.350–4.500)

## 2022-01-15 LAB — GLUCOSE, CAPILLARY
Glucose-Capillary: 128 mg/dL — ABNORMAL HIGH (ref 70–99)
Glucose-Capillary: 30 mg/dL — CL (ref 70–99)
Glucose-Capillary: 48 mg/dL — ABNORMAL LOW (ref 70–99)
Glucose-Capillary: 92 mg/dL (ref 70–99)
Glucose-Capillary: 104 mg/dL — ABNORMAL HIGH (ref 70–99)
Glucose-Capillary: 148 mg/dL — ABNORMAL HIGH (ref 70–99)
Glucose-Capillary: 127 mg/dL — ABNORMAL HIGH (ref 70–99)

## 2022-01-15 LAB — SURGICAL PATHOLOGY

## 2022-01-15 LAB — PROCALCITONIN: Procalcitonin: 4.08 ng/mL

## 2022-01-15 MED ORDER — CHLORHEXIDINE GLUCONATE CLOTH 2 % EX PADS
6.0000 | MEDICATED_PAD | Freq: Every day | CUTANEOUS | Status: DC
Start: 2022-01-15 — End: 2022-01-16
  Administered 2022-01-16: 6 via TOPICAL

## 2022-01-15 MED ORDER — DOXERCALCIFEROL 4 MCG/2ML IV SOLN
INTRAVENOUS | Status: AC
  Administered 2022-01-15: 11 ug via INTRAVENOUS
  Filled 2022-01-15: qty 6

## 2022-01-15 MED ORDER — DEXTROSE 50 % IV SOLN
INTRAVENOUS | Status: AC
  Administered 2022-01-15: 50 mL
  Filled 2022-01-15: qty 50

## 2022-01-15 MED ORDER — INSULIN ASPART PROT & ASPART (70-30 MIX) 100 UNIT/ML ~~LOC~~ SUSP
20.0000 [IU] | Freq: Two times a day (BID) | SUBCUTANEOUS | Status: DC
  Administered 2022-01-15 – 2022-01-16 (×2): 20 [IU] via SUBCUTANEOUS

## 2022-01-15 MED ORDER — HYDRALAZINE HCL 20 MG/ML IJ SOLN: 5.0000 mg | INTRAMUSCULAR | Status: DC | PRN

## 2022-01-15 NOTE — Progress Notes (Signed)
Pt called out to front desk stating he thought his sugars were low. Rn requested tech to check CBG. CBG of 49. Patient Aox4, airway patent. Provided one vanilla Nepro, which patient immediately drank. Will recheck in 10 minutes. Ronnie Derby MD notified via [facility] page.

## 2022-01-15 NOTE — Plan of Care (Signed)

## 2022-01-15 NOTE — Consult Note (Signed)
Cardiology Consultation:   Patient ID: [name] MRN: [id number]; DOB: 20-Sep-1970  Admit date: 01/07/2022 Date of Consult: 01/15/2022  PCP:  [name], MD   [facility] Providers Cardiologist:  Kirk Ruths, MD        Patient Profile:   Edward [name] is a 51 y.o. male with a hx of NSTEMI, CHF in 2019 with PCI to LCX with DES, syncope with AKI, PAD, HFrEF 45%, severe MR and TR, and hx of mobitz 1 2;1 block, DM-2, HLD, orthostatic hypotension, AAA who is being seen 01/15/2022 for the evaluation of mobitz 1 at the request of Dr. Candiss Norse.  History of Present Illness:   Edward [name] with hx as above and now ESRD on HD T,TH,SAT, CAD with lst cath 2019 with LCX 85% stenosis with DES stent placed, mid LAD to dLAD 90% stenosis, distal LAD small vessel, not PCI target.  Lat 1st Marginal 75% stenosed.  PAD, and last echo EF 50-55%. Pericardial effusion is post to LV. Mod to severe MR mild AR, mild TR.   Also hx of severe PAD s/p left transmetatarsal amputation, right BKA with necrotic stump, multiple amputation in left hand fingers  Pt admitted 01/07/22 after planned PV angiogram of bilateral lower ext angiogram.  He has Lt foot wound as well-healing whild Rt BKA site is necrotic.  Pt underwent Rt AKA on 01/08/22.  Pt seen by CCM for encephalopathy, hypotension, fever, severe sepsis and acute blood loss anemia with anemia of chronic disease.  His ASA and plavix have been con't.      Pt has progressed, sepsis resolved, though at 0330 or so pt had slow HR, Mobitz 1 second degree HB.  He has short runs.    Now in dialysis.   EKG:  The EKG was personally reviewed and demonstrates:  01/15/22 Mobitz 1 HR 65 but looking back to Feb 2022 appears junct with HR 48 no ST changes   Telemetry:  Telemetry was personally reviewed and demonstrates:  Mobitz I 2;1 block that comes and goes.    Labs today  Na 133 k+ 4.4 glucose 297  Cr 6.62  Hgb 8.5 WBC 10 plts 297 TSH 1.581  No chest pain no SOB no  syncope no lightheadedness  BP 147/71 P 73 T 97.8 afebrile  Having dialysis now.    Past Medical History:  Diagnosis Date   Allergy    Anemia    getting Venofer with dialysis   Anxiety    self reported, sometimes-situational anxiety   Arthritis    bilateral hands/LEFT hip   CHF (congestive heart failure) (HCC)    Chronic kidney disease    t th s dialysis- [place] ---ESRD   Complication of anesthesia    slow to go to sleep   Coronary artery disease    Depression    self reported, sometimes-situational depression   Diabetes mellitus    type II- on meds   Dyspnea    sometimes- fluid   GERD (gastroesophageal reflux disease)    on meds   Hypertension    no longer on medication since starting on Hemodialysis   Myocardial infarction [facility]) 2019   Sleep apnea     Past Surgical History:  Procedure Laterality Date   A/V FISTULAGRAM Left 03/06/2020   Procedure: A/V FISTULAGRAM;  Surgeon: Waynetta Sandy, MD;  Location: [place] [facility];  Service: Cardiovascular;  Laterality: Left;   ABDOMINAL AORTOGRAM N/A 01/07/2022   Procedure: ABDOMINAL AORTOGRAM;  Surgeon: Servando Snare  Harrell Gave, MD;  Location: [place] [facility];  Service: Cardiovascular;  Laterality: N/A;   ABDOMINAL AORTOGRAM W/LOWER EXTREMITY N/A 04/09/2021   Procedure: ABDOMINAL AORTOGRAM W/LOWER EXTREMITY;  Surgeon: Waynetta Sandy, MD;  Location: [place] [facility];  Service: Cardiovascular;  Laterality: N/A;   AMPUTATION Left 05/10/2016   Procedure: Left Transmetatarsal Amputation;  Surgeon: [name], MD;  Location: [place];  Service: Orthopedics;  Laterality: Left;   AMPUTATION Right 05/16/2018   Procedure: PARTIAL RAY AMPUTATION FIFTH TOE RIGHT FOOT;  Surgeon: [name], DPM;  Location: [place];  Service: Podiatry;  Laterality: Right;   AMPUTATION Right 06/17/2018   Procedure: AMPUTATION 4th RAY Right Foot;  Surgeon: Trula Slade, DPM;  Location: [place];  Service: Podiatry;  Laterality:  Right;   AMPUTATION Right 06/19/2018   Procedure: RIGHT BELOW KNEE AMPUTATION;  Surgeon: [name], MD;  Location: [place];  Service: Orthopedics;  Laterality: Right;   AMPUTATION Left 10/05/2021   Procedure: LEFT RING FINGER AMPUTATION;  Surgeon: Daryll Brod, MD;  Location: [place];  Service: Orthopedics;  Laterality: Left;  45 MIN   AMPUTATION Left 11/15/2021   Procedure: LEFT INDEX FINGER AMPUTATION;  Surgeon: Leanora Cover, MD;  Location: [place];  Service: Orthopedics;  Laterality: Left;  60 MIN   AMPUTATION Right 01/08/2022   Procedure: AMPUTATION ABOVE KNEE;  Surgeon: Waynetta Sandy, MD;  Location: [place];  Service: Vascular;  Laterality: Right;   AMPUTATION Bilateral 01/12/2022   Procedure: LEFT INDEX FINGER AMPUTATION, RIGHT INDEX, LONG AND SMALL DIGITS AMPUTATION;  Surgeon: Leanora Cover, MD;  Location: [place];  Service: Orthopedics;  Laterality: Bilateral;   AORTIC ARCH ANGIOGRAPHY N/A 01/07/2022   Procedure: AORTIC ARCH ANGIOGRAPHY;  Surgeon: Waynetta Sandy, MD;  Location: [place] [facility];  Service: Cardiovascular;  Laterality: N/A;   APPLICATION OF WOUND VAC Right 06/19/2018   Procedure: APPLICATION OF WOUND VAC;  Surgeon: [name], MD;  Location: [place];  Service: Orthopedics;  Laterality: Right;   AV FISTULA PLACEMENT Left 11/25/2019   Procedure: LEFT ARM Brachiocephalic  ARTERIOVENOUS (AV) FISTULA CREATION;  Surgeon: Rosetta Posner, MD;  Location: [place];  Service: Vascular;  Laterality: Left;   [place] Left 07/19/2020   Procedure: LEFT SECOND STAGE [place];  Surgeon: Waynetta Sandy, MD;  Location: [place];  Service: Vascular;  Laterality: Left;   CARDIAC CATHETERIZATION  07/29/2018   CIRCUMCISION  09/02/2011   Procedure: CIRCUMCISION ADULT;  Surgeon: Molli Hazard, MD;  Location: [place];  Service: Urology;  Laterality: N/A;   CORONARY STENT INTERVENTION N/A 07/29/2018   Procedure: CORONARY  STENT INTERVENTION;  Surgeon: Leonie Man, MD;  Location: [place] [facility];  Service: Cardiovascular;  Laterality: N/A;   DEBRIDEMENT AND CLOSURE WOUND Left 11/15/2021   Procedure: LEFT HAND WOUND CLOSURE;  Surgeon: Leanora Cover, MD;  Location: [place];  Service: Orthopedics;  Laterality: Left;   I & D EXTREMITY  09/02/2011   Procedure: IRRIGATION AND DEBRIDEMENT EXTREMITY;  Surgeon: Theodoro Kos, DO;  Location: [place];  Service: Plastics;  Laterality: N/A;   INCISION AND DRAINAGE OF WOUND  08/12/2011   Procedure: IRRIGATION AND DEBRIDEMENT WOUND;  Surgeon: Zenovia Jarred, MD;  Location: [place];  Service: General;;  perineum   INSERTION OF DIALYSIS CATHETER N/A 11/25/2019   Procedure: INSERTION OF Righ Internal Jugular DIALYSIS CATHETER;  Surgeon: Rosetta Posner, MD;  Location: [place];  Service: Vascular;  Laterality: N/A;   IRRIGATION AND DEBRIDEMENT ABSCESS  08/12/2011   Procedure: IRRIGATION AND DEBRIDEMENT ABSCESS;  Surgeon: Molli Hazard, MD;  Location: [place];  Service: Urology;  Laterality: N/A;  irrigation and debridment perineum     IRRIGATION AND DEBRIDEMENT ABSCESS  08/14/2011   Procedure: MINOR INCISION AND DRAINAGE OF ABSCESS;  Surgeon: Molli Hazard, MD;  Location: [place];  Service: Urology;  Laterality: N/A;  Perineal Wound Debridement;Placement Bilateral Testicular Thigh Pouches   LEFT HEART CATH AND CORONARY ANGIOGRAPHY N/A 07/29/2018   Procedure: LEFT HEART CATH AND CORONARY ANGIOGRAPHY;  Surgeon: Leonie Man, MD;  Location: [place] [facility];  Service: Cardiovascular;  Laterality: N/A;   LOWER EXTREMITY ANGIOGRAPHY Bilateral 01/07/2022   Procedure: Lower Extremity Angiography;  Surgeon: Waynetta Sandy, MD;  Location: [place] [facility];  Service: Cardiovascular;  Laterality: Bilateral;   PERIPHERAL VASCULAR ATHERECTOMY Left 04/09/2021   Procedure: PERIPHERAL VASCULAR ATHERECTOMY;  Surgeon: Waynetta Sandy, MD;  Location:  [place] [facility];  Service: Cardiovascular;  Laterality: Left;  TP trunk and peroneal arteries   PERIPHERAL VASCULAR BALLOON ANGIOPLASTY Left 04/09/2021   Procedure: PERIPHERAL VASCULAR BALLOON ANGIOPLASTY;  Surgeon: Waynetta Sandy, MD;  Location: [place] [facility];  Service: Cardiovascular;  Laterality: Left;  Popliteal   REVISON OF ARTERIOVENOUS FISTULA Left 05/24/2020   Procedure: LEFT ARM ARTERIOVENOUS FISTULA  CONVERSION TO BASILIC VEIN FISTULA;  Surgeon: Waynetta Sandy, MD;  Location: [place];  Service: Vascular;  Laterality: Left;   TOTAL HIP ARTHROPLASTY Left 10/11/2016   Procedure: LEFT TOTAL HIP ARTHROPLASTY ANTERIOR APPROACH;  Surgeon: Mcarthur Rossetti, MD;  Location: [facility];  Service: Orthopedics;  Laterality: Left;   UPPER EXTREMITY ANGIOGRAPHY Bilateral 01/07/2022   Procedure: Upper Extremity Angiography;  Surgeon: Waynetta Sandy, MD;  Location: [place] [facility];  Service: Cardiovascular;  Laterality: Bilateral;     Home Medications:  Prior to Admission medications   Medication Sig Start Date End Date Taking? Authorizing Provider  acetaminophen (TYLENOL) 500 MG tablet Take 500-1,000 mg by mouth every 6 (six) hours as needed for moderate pain or headache.   Yes [provider]  aspirin EC 81 MG tablet Take 1 tablet (81 mg total) by mouth daily. Swallow whole. 03/14/20  Yes Lelon Perla, MD  atorvastatin (LIPITOR) 80 MG tablet Take 80 mg by mouth daily.   Yes [provider]  clopidogrel (PLAVIX) 75 MG tablet Take 1 tablet (75 mg total) by mouth daily. 04/09/21 04/09/22 Yes Waynetta Sandy, MD  gabapentin (NEURONTIN) 100 MG capsule Take 100 mg by mouth 3 (three) times daily. 12/03/21  Yes [provider]  insulin aspart protamine - aspart (NOVOLOG MIX 70/30 FLEXPEN) (70-30) 100 UNIT/ML FlexPen Inject 0.3 mLs (30 Units total) into the skin 2 (two) times daily with a meal. 09/29/20  Yes Lavina Hamman, MD   lidocaine-prilocaine (EMLA) cream Apply 1 application topically See admin instructions. Apply a small amount to access site (AVF) 1-2 hours before dialysis. Cover with occlusive dressing (saran wrap). Tuesday,Thursday,saturday 03/22/21  Yes [provider]  pantoprazole (PROTONIX) 40 MG tablet Take 1 tablet (40 mg total) by mouth in the morning and at bedtime. Patient taking differently: Take 40 mg by mouth daily. 08/29/21  Yes Willia Craze, NP  traMADol (ULTRAM) 50 MG tablet Take 50 mg by mouth 2 (two) times daily as needed. 12/31/21  Yes [provider]  VELPHORO 500 MG chewable tablet Chew  1,000 mg by mouth 3 (three) times daily with meals. 06/14/20  Yes [provider]  bismuth subsalicylate (PEPTO BISMOL) 262 MG/15ML suspension Take 30 mLs by mouth every 6 (six) hours as needed for indigestion.    [provider]  blood glucose meter kit and supplies Dispense based on patient and insurance preference. Use up to four times daily as directed. (FOR ICD-9 250.00, 250.01). 07/01/18   Angiulli, Lavon Paganini, PA-C  Insulin Pen Needle 31G X 5 MM MISC Use daily to inject insulin as instructed 07/01/18   Angiulli, Lavon Paganini, PA-C  linaclotide (LINZESS) 72 MCG capsule Take 1 capsule (72 mcg total) by mouth daily before breakfast. Patient taking differently: Take 72 mcg by mouth daily as needed (constipation). 12/13/20   Armbruster, Carlota Raspberry, MD  triamcinolone ointment (KENALOG) 0.1 % 1 application. daily as needed (rash). 09/04/21   [provider]    Inpatient Medications: Scheduled Meds:  aspirin EC  81 mg Oral Daily   atorvastatin  80 mg Oral Daily   Chlorhexidine Gluconate Cloth  6 each Topical Q0600   clopidogrel  75 mg Oral Daily   darbepoetin (ARANESP) injection - DIALYSIS  100 mcg Intravenous Q Thu-HD   ezetimibe  10 mg Oral Daily   feeding supplement (NEPRO CARB STEADY)  237 mL Oral BID BM   gabapentin  100 mg Oral BID   heparin  5,000 Units  Subcutaneous Q8H   insulin aspart  0-9 Units Subcutaneous TID WC   insulin aspart protamine- aspart  20 Units Subcutaneous BID WC   linaclotide  72 mcg Oral QAC breakfast   pantoprazole  40 mg Oral BID   polyethylene glycol  17 g Oral Daily   sodium zirconium cyclosilicate  10 g Oral Daily   sucroferric oxyhydroxide  1,000 mg Oral TID WC   Continuous Infusions:  sodium chloride     sodium chloride     sodium chloride 250 mL (01/12/22 2356)   meropenem (MERREM) IV 500 mg (01/14/22 2015)   vancomycin     PRN Meds: sodium chloride, sodium chloride, alteplase, bisacodyl, bismuth subsalicylate, fentaNYL (SUBLIMAZE) injection, heparin, heparin, hydrALAZINE, hydrALAZINE, lidocaine (PF), lidocaine-prilocaine, naLOXone (NARCAN)  injection, ondansetron (ZOFRAN) IV, [DISCONTINUED] ondansetron **OR** ondansetron (ZOFRAN) IV, oxyCODONE, pentafluoroprop-tetrafluoroeth, traMADol  Allergies:    Allergies  Allergen Reactions   Crestor [Rosuvastatin] Rash    Also had a red eye   Dilaudid [Hydromorphone] Itching   Benadryl [Diphenhydramine] Rash    Social History:   Social History   Socioeconomic History   Marital status: Widowed    Spouse name: Not on file   Number of children: Not on file   Years of education: Not on file   Highest education level: Not on file  Occupational History   Occupation: Disabled  Tobacco Use   Smoking status: Former    Packs/day: 0.25    Years: 24.00    Pack years: 6.00    Types: Cigarettes    Quit date: 11/19/2019    Years since quitting: 2.1   Smokeless tobacco: Never  Vaping Use   Vaping Use: Never used  Substance and Sexual Activity   Alcohol use: Not Currently    Alcohol/week: 0.0 standard drinks    Comment: quit 2018   Drug use: No   Sexual activity: Not Currently    Birth control/protection: Condom  Other Topics Concern   Not on file  Social History Narrative   Lives w/ his 2 sons.   Social Determinants of Health  Financial Resource  Strain: Not on file  Food Insecurity: Not on file  Transportation Needs: Not on file  Physical Activity: Not on file  Stress: Not on file  Social Connections: Not on file  Intimate Partner Violence: Not on file    Family History:    Family History  Problem Relation Age of Onset   Diabetes Other    Pancreatic cancer Mother 61   Colon cancer Neg Hx    Colon polyps Neg Hx    Esophageal cancer Neg Hx    Rectal cancer Neg Hx    Stomach cancer Neg Hx      ROS:  Please see the history of present illness.  General:no colds or fevers, no weight changes Skin:no rashes or ulcers HEENT:no blurred vision, no congestion CV:see HPI PUL:see HPI GI:no diarrhea constipation or melena, no indigestion GU:no hematuria, no dysuria- dialysis pt. MS:no joint pain, no claudication Neuro:no syncope, no lightheadedness Endo:+ diabetes, no thyroid disease  All other ROS reviewed and negative.     Physical Exam/Data:   Vitals:   01/15/22 1216 01/15/22 1229 01/15/22 1430 01/15/22 1459  BP:  140/75 (!) 132/59 139/66  Pulse:   70 72  Resp: _0 Temp:      TempSrc:      SpO2: 98% 100% 98% 99%  Weight:      Height:        Intake/Output Summary (Last 24 hours) at 01/15/2022 1521 Last data filed at 01/15/2022 [id number] Gross per 24 hour  Intake 240 ml  Output 0 ml  Net 240 ml      01/12/2022    7:30 PM 01/12/2022    4:03 PM 01/11/2022    6:00 AM  Last 3 Weights  Weight (lbs) 196 lb 10.4 oz 199 lb 1.2 oz 194 lb 10.7 oz  Weight (kg) 89.2 kg 90.3 kg 88.3 kg     Body mass index is 25.94 kg/m.  General:  Well nourished, well developed, in no acute distress HEENT: normal Neck: no JVD Vascular: No carotid bruits; Distal pulses 2+ bilaterally Cardiac:  normal S1, S2; pauses at times RRR; no murmur gallup rub or click Lungs:  clear to auscultation bilaterally, no wheezing, rhonchi or rales  ant position Abd: soft, nontender, no hepatomegaly  Ext: no edema Musculoskeletal: finger with  amputations , Rt AKA, Lt with una boot. Skin: warm and dry  Neuro:  alert and oriented X 3 MAE, no focal abnormalities noted Psych:  Normal affect   Relevant CV Studies:  Cardiac cath 07/29/18   Prox Cx to Mid Cx lesion is 85% stenosed. A drug-eluting stent was successfully placed using a STENT SYNERGY DES 2.5X32. Postdilated and tapered fashion from 2.8 mm proximal and up to 3.1 mm in the distal two thirds of the stent. Post intervention, there is a 0% residual stenosis. LV end diastolic pressure is severely elevated. 25 mmHg Mid LAD to Dist LAD lesion is 90% stenosed. Distal LAD small vessel. Not PCI target. Lat 1st Mrg lesion is 75% stenosed.   SUMMARY Severe two-vessel disease Culprit lesion likely being the mid circumflex treated with a DES stent (Synergy DES 2.5 mm x 32 mm -postdilated to 3.1 mm),  There is also diffuse extensive disease in the distal/apical portion of the LAD that is not PCI minimal.   Several other very small branch vessels-diagonal branches, lateral OM's etc. have significant disease, but are too small for PTCA.   Otherwise ectatic RCA and proximal to  mid LAD. Severely elevated LVEDP of 25 mmHg (in the setting of reduced EF) suggestive of ACUTE COMBINED SYSTOLIC AND DIASTOLIC HEART FAILURE   RECOMMENDATIONS Return to 2 [facility] Care unit for ongoing care and TR band removal per protocol We will dose Lasix 40 mg IV x3 starting this evening for evidence of acute combined systolic and diastolic heart failure. Continue other aggressive risk factor modification and cardiomyopathy treatment. Medical management for LAD and other small vessel disease including nitrate, beta-blocker etc.   Would monitor for least 1 more day following PCI to ensure stable renal function given contrast load with a creatinine of 1.8.     Diagnostic Dominance: Right Intervention  Implants     Last Echo 09/27/20  EF 50-55% The left ventricle demonstrates  regional wall  motion abnormalities (see scoring  diagram/findings for description). The left ventricular internal cavity  size was mildly dilated. There is mild left ventricular hypertrophy. Left  ventricular diastolic parameters are consistent with Grade II diastolic  dysfunction (pseudonormalization).   Right ventricular systolic function is normal. The right ventricular  size is normal. There is moderately elevated pulmonary artery systolic  pressure.   3. Left atrial size was moderately dilated.   4. The pericardial effusion is posterior to the left ventricle.   5. Carpentier class 3B restricted posterior leaflet motion . The mitral  valve is abnormal. Moderate to severe mitral valve regurgitation. No  evidence of mitral stenosis.   6. The aortic valve is tricuspid. Aortic valve regurgitation is mild. No  aortic stenosis is present.   7. The inferior vena cava is normal in size with greater than 50%  respiratory variability, suggesting right atrial pressure of 3 mmHg.   FINDINGS   Left Ventricle: Abnormal septal motion Distal septal / apical  hypokinesis. Left ventricular ejection fraction, by estimation, is 50 to  55%. The left ventricle has low normal function. The left ventricle  demonstrates regional wall motion abnormalities.  The left ventricular internal cavity size was mildly dilated. There is  mild left ventricular hypertrophy. Left ventricular diastolic parameters  are consistent with Grade II diastolic dysfunction (pseudonormalization).  Elevated left ventricular  end-diastolic pressure.   Right Ventricle: The right ventricular size is normal. No increase in  right ventricular wall thickness. Right ventricular systolic function is  normal. There is moderately elevated pulmonary artery systolic pressure.  The tricuspid regurgitant velocity is  3.16 m/s, and with an assumed right atrial pressure of 8 mmHg, the  estimated right ventricular systolic pressure is 47.9 mmHg.   Left  Atrium: Left atrial size was moderately dilated.   Right Atrium: Right atrial size was normal in size.   Pericardium: Trivial pericardial effusion is present. The pericardial  effusion is posterior to the left ventricle.   Mitral Valve: Carpentier class 3B restricted posterior leaflet motion. The  mitral valve is abnormal. There is moderate thickening of the mitral valve  leaflet(s). There is mild calcification of the mitral valve leaflet(s).  Moderate to severe mitral valve  regurgitation. No evidence of mitral valve stenosis.   Tricuspid Valve: The tricuspid valve is normal in structure. Tricuspid  valve regurgitation is mild . No evidence of tricuspid stenosis.   Aortic Valve: The aortic valve is tricuspid. Aortic valve regurgitation is  mild. Aortic regurgitation PHT measures 508 msec. No aortic stenosis is  present.   Pulmonic Valve: The pulmonic valve was normal in structure. Pulmonic valve  regurgitation is not visualized. No evidence of pulmonic stenosis.  Aorta: The aortic root is normal in size and structure.   Venous: The inferior vena cava is normal in size with greater than 50%  respiratory variability, suggesting right atrial pressure of 3 mmHg.   IAS/Shunts: No atrial level shunt detected by color flow Doppler.    EVENT monitor 12/2021 for Mobitz I min HR of 42 bpm, max HR of 231 bpm, and avg HR of 84 bpm. Predominant underlying rhythm was Sinus Rhythm. First Degree AV Block was present. 1 run of Ventricular Tachycardia occurred lasting 5 beats with a max rate of 231 bpm (avg 193  bpm). 1 run of Supraventricular Tachycardia occurred lasting 6 beats with a max rate of 124 bpm (avg 115 bpm). Second Degree AV Block-Mobitz I (Wenckebach) was present. Wenckebach was detected within +/- 45 seconds of symptomatic patient event(s).  Isolated SVEs were rare (<1.0%), SVE Couplets were rare (<1.0%), and SVE Triplets were rare (<1.0%). Isolated VEs were occasional (1.4%, 9715),  VE Couplets were rare (<1.0%, 87), and VE Triplets were rare (<1.0%, 15). Ventricular Trigeminy was present.   Sinus bradycardia, NSR, occasional PAC, brief PAT, 5 beats NSVT and occasional Mobitz 1 second degree AV block. Kirk Ruths       Laboratory Data:  High Sensitivity Troponin:  No results for input(s): TROPONINIHS in the last 720 hours.   Chemistry Recent Labs  Lab 01/10/22 5361 01/11/22 0239 01/11/22 1755 01/12/22 0041 01/13/22 0055 01/14/22 0029  NA 135 136   < > 132* 133* 133*  K 4.0 2.9*   < > 3.8 5.6* 4.4  CL 95* 97*   < > 95* 96* 94*  CO2 22 27   < > _0 GLUCOSE 88 94   < > 153* 286* 297*  BUN 39* 14   < > 28* 25* 45*  CREATININE 8.17* 3.31*   < > 5.81* 4.65* 6.62*  CALCIUM 8.8* 8.6*   < > 8.6* 8.7* 9.9  MG 2.1 2.1  --  2.0  --   --   GFRNONAA 7* 22*   < > 11* 14* 9*  ANIONGAP 18* 12   < > _1 < > = values in this interval not displayed.    Recent Labs  Lab 01/11/22 0239 01/11/22 1755 01/12/22 0041 01/13/22 0055 01/14/22 0029  PROT 6.8  --  6.3* 6.5  --   ALBUMIN 2.5*   < > 2.4* 2.4* 2.5*  AST 34  --  24 31  --   ALT 12  --  8 12  --   ALKPHOS 108  --  112 108  --   BILITOT 0.7  --  0.8 1.3*  --    < > = values in this interval not displayed.   Lipids No results for input(s): CHOL, TRIG, HDL, LABVLDL, LDLCALC, CHOLHDL in the last 168 hours.  Hematology Recent Labs  Lab 01/11/22 1755 01/12/22 0041 01/13/22 0055  WBC 9.0 8.1 10.4  RBC 2.68* 2.51* 2.66*  HGB 8.7* 7.8* 8.5*  HCT 26.2* 24.7* 26.2*  MCV 97.8 98.4 98.5  MCH 32.5 31.1 32.0  MCHC 33.2 31.6 32.4  RDW 16.7* 16.4* 16.3*  PLT 281 263 297   Thyroid  Recent Labs  Lab 01/15/22 0128  TSH 1.581    BNPNo results for input(s): BNP, PROBNP in the last 168 hours.  DDimer No results for input(s): DDIMER in the last 168 hours.   Radiology/Studies:  No results found.   Assessment and  Plan:   Mobitz 1 on strips overall improved from 09/2021.  Recent event monitor with  no long pauses, brief PAT, 5 beats NSVT and occ Mobitz 1 2:1 block.  Pt asymptomatic no rate lowering meds, no syncope - instructed to call if dizziness or syncope or go to ER if at home CAD with prior stent and continue ASA and plavix  nuc study was ordered for chest pain when seen in Feb but has not been done.  He now denies chest pain.  He never had nuc study.  Last cath 2019 as above HLD on statin, on lipitor 80 did not tolerate crestor. ESRD on HD TTS per nephrology Cardiomyopathy Ischemic/chronic combines systolic/diastolic HF managed with HD but with hypotension unable to add GDMT Orthostatic hypotension on midodrine. PAD now with AKA on Rt due to infection and then sepsis improving. Anemia per IM   Risk Assessment/Risk Scores:                For questions or updates, please contact [place] Please consult [email or web address] for contact info under    Signed, Cecilie Kicks, NP  01/15/2022 3:21 PM

## 2022-01-15 NOTE — Progress Notes (Signed)
Pt stated chest pain has stopped after intervention received will cont to montor

## 2022-01-15 NOTE — Progress Notes (Signed)
Pt developed intermittent irregular heart rhythm since evening of 5/29. Telemetry documented suspected heart block at 0255. Dr Hal Hope notified at [place] and 12-lead EKG was performed at bedside. RN will continue to monitor.

## 2022-01-15 NOTE — Progress Notes (Signed)
Inpatient Diabetes Program Recommendations  AACE/ADA: New Consensus Statement on Inpatient Glycemic Control (2015)  Target Ranges:  Prepandial:   less than 140 mg/dL      Peak postprandial:   less than 180 mg/dL (1-2 hours)      Critically ill patients:  140 - 180 mg/dL   Lab Results  Component Value Date   GLUCAP 30 (LL) 01/15/2022   HGBA1C 6.5 (H) 01/07/2022    Review of Glycemic Control  Latest Reference Range & Units 01/15/22 06:18 01/15/22 10:02 01/15/22 10:18  Glucose-Capillary 70 - 99 mg/dL 128 (H) 48 (L) 30 (LL)   Diabetes history: DM 2 Outpatient Diabetes medications:  Novolog 70/30- 30 units bid Current orders for Inpatient glycemic control:  Novolog 70/30 20 units bid Novolog 2 units tid with meals Novolog 0-9 units tid with meals Inpatient Diabetes Program Recommendations:    Note hypoglycemia.  Please consider d/c of Novolog meal coverage 2 units tid with meals since patient is on Novolog 70/30.   Thanks,  . Adah Perl, RN, BC-ADM Inpatient Diabetes Coordinator Pager [phone]  (8a-5p)

## 2022-01-15 NOTE — Progress Notes (Addendum)
PROGRESS NOTE                                                                                                                                                                                                             Patient Demographics:    Edward [name], is a 51 y.o. male, DOB - July 12, 1971, ZJI:[id number]  Outpatient Primary MD for the patient is [name], MD    LOS - 8  Admit date - 01/07/2022    No chief complaint on file.      Brief Narrative (HPI from H&P)    51 y.o. male, history of severe PAD s/p left transmetatarsal amputation, right BKA with necrotic stump, multiple amputation in left hand fingers, ESRD on TTS schedule for dialysis, DM type 2 insulin-dependent, dyslipidemia, CAD with MI in past, combined systolic and diastolic CHF EF 17%, orthostatic hypotension of chronic disease, AAA, recent Zio patch placement, who was brought in to the Cath Lab for elective bilateral lower extremity angiogram which was done by Dr. Donzetta Matters to evaluate ongoing right BKA stump pain and necrosis, postprocedure hospitalist team was requested to admit the patient as he may require further revision of his right lower extremity stump and underwent AKA this admission complicated by sepsis.   Subjective:   Patient in bed, appears comfortable, denies any headache, no fever, no chest pain or pressure, no shortness of breath , no abdominal pain. No focal weakness.   Assessment  & Plan :   1.  Right BKA stump site discomfort, history of severe PAD, with a right BKA stump necrosis and post AKA Sepsis.  He has been seen by critical care and vascular surgery, on appropriate IV antibiotics with negative blood cultures, source of infection could be right leg BKA stump infection which eventually required revision into AKA, also see #2 below for hand surgery.  Sepsis pathophysiology has resolved, continue IV antibiotics for a total of 7 days.  Monitor  cultures closely.  Blood cultures thus far negative till 01/15/2022.  For PAD continue dual antiplatelet therapy along with statin and Zetia.  Vascular surgery is following his postop stump site appears clean.   2.  Severe PAD requiring right BKA and now right AKA, left transmetatarsal amputation, multiple amputations of left upper extremity fingers - for PAD DAPT, Zetia and statin for secondary prevention.  Vascular  surgery on board.  Hand surgeon Dr. Fredna Dow also on board patient underwent on 01/12/2022 -  1.  Left index finger amputation at MP joint 2.  Right index finger amputation through proximal phalanx 3.  Right long finger amputation through proximal phalanx 4.  Right small finger amputation through PIP joint    3.  ESRD.  On TTS schedule, has left arm AV fistula, renal consulted.   4.  CAD with ischemic cardiomyopathy.  No acute issues supportive care, continue DAPT with statin for secondary prevention, has chronic orthostatic hypotension precluding the use of beta-blockers.   5.  Chronic combined systolic and diastolic CHF with EF 33%.  Currently compensated, fluid removal via HD, blood pressure too low to allow beta-blocker, ACE/ARB use.   6.   Mobitz type I second-degree heart block noted on telemetry and EKG on 01/14/2022.  Not on rate controlling agents, was asymptomatic, have requested cardiology to evaluate the patient.  Continue to monitor on telemetry, check TSH.  7.  Anemia of chronic disease with some perioperative blood loss related anemia along with heme dilution from IV fluids.  Due to ESRD.  On iron supplementation with HD treatments, received 1 unit of packed RBC early morning 01/10/2022, stable posttransfusion H&H no signs of active ongoing bleeding, on PPI as well.  He may require another unit of transfusion on 01/13/2022 based on his surgical blood loss on 01/12/2022.  8.  Hypokalemia in ESRD patient.  Gentle replacement.   9. Dyslipidemia.  LDL above goal on max dose  statin and Zetia added.  10. DM type II.  Sugars extremely labile, likely highly diet dependent, adjusted further on 01/15/2022.  Lab Results  Component Value Date   HGBA1C 6.5 (H) 01/07/2022   CBG (last 3)  Recent Labs    01/15/22 0618 01/15/22 1002 01/15/22 1018  GLUCAP 128* 48* 30*          Condition - Extremely Guarded  Family Communication  :    Called Son Fannie Knee [phone] -updated in detail on 01/15/2022  Code Status :  Full  Consults  :  Renal, VVS, PCCM, cardiology called  PUD Prophylaxis : PPI   Procedures  :     Dr. Fredna Dow hand surgeon on 01/12/2022 -  PROCEDURE:  1.  Left index finger amputation at MP joint 2.  Right index finger amputation through proximal phalanx 3.  Right long finger amputation through proximal phalanx 4.  Right small finger amputation through PIP joint  Right AKA on 01/08/2022      Disposition Plan  :    Status is: Inpatient  DVT Prophylaxis  :    heparin injection 5,000 Units Start: 01/07/22 2200  Lab Results  Component Value Date   PLT 297 01/13/2022    Diet :  Diet Order             Diet renal with fluid restriction Fluid restriction: 1200 mL Fluid; Room service appropriate? Yes; Fluid consistency: Thin  Diet effective now                    Inpatient Medications  Scheduled Meds:  aspirin EC  81 mg Oral Daily   atorvastatin  80 mg Oral Daily   Chlorhexidine Gluconate Cloth  6 each Topical Q0600   clopidogrel  75 mg Oral Daily   darbepoetin (ARANESP) injection - DIALYSIS  100 mcg Intravenous Q Thu-HD   doxercalciferol  11 mcg Intravenous Q T,Th,Sa-HD   ezetimibe  10 mg  Oral Daily   feeding supplement (NEPRO CARB STEADY)  237 mL Oral BID BM   gabapentin  100 mg Oral BID   heparin  5,000 Units Subcutaneous Q8H   insulin aspart  0-9 Units Subcutaneous TID WC   insulin aspart protamine- aspart  20 Units Subcutaneous BID WC   linaclotide  72 mcg Oral QAC breakfast   pantoprazole  40 mg Oral BID    polyethylene glycol  17 g Oral Daily   sodium zirconium cyclosilicate  10 g Oral Daily   sucroferric oxyhydroxide  1,000 mg Oral TID WC   Continuous Infusions:  sodium chloride     sodium chloride     sodium chloride 250 mL (01/12/22 2356)   meropenem (MERREM) IV 500 mg (01/14/22 2015)   vancomycin     PRN Meds:.sodium chloride, sodium chloride, alteplase, bisacodyl, bismuth subsalicylate, fentaNYL (SUBLIMAZE) injection, heparin, heparin, hydrALAZINE, hydrALAZINE, lidocaine (PF), lidocaine-prilocaine, naLOXone (NARCAN)  injection, ondansetron (ZOFRAN) IV, [DISCONTINUED] ondansetron **OR** ondansetron (ZOFRAN) IV, oxyCODONE, pentafluoroprop-tetrafluoroeth, traMADol  Time Spent in minutes  30   Lala Lund M.D on 01/15/2022 at 10:46 AM  To page go to [email or web address]   [facility] -  Office  [phone]  See all Orders from today for further details    Objective:   Vitals:   01/14/22 1931 01/14/22 2313 01/15/22 0320 01/15/22 0806  BP: (!) 141/77 127/61  127/73  Pulse: 79 84 62   Resp: '15  18 20  '$ Temp: 97.8 F (36.6 C) 97.6 F (36.4 C) 97.7 F (36.5 C) 98.6 F (37 C)  TempSrc: Oral Oral Oral Oral  SpO2:   91% 100%  Weight:      Height:        Wt Readings from Last 3 Encounters:  01/12/22 89.2 kg  12/26/21 103.9 kg  11/28/21 104 kg     Intake/Output Summary (Last 24 hours) at 01/15/2022 1046 Last data filed at 01/15/2022 0624 Gross per 24 hour  Intake 540 ml  Output 0 ml  Net 540 ml      Physical Exam  Awake Alert, No new F.N deficits, Normal affect [place].AT,PERRAL Supple Neck, No JVD,   Symmetrical Chest wall movement, Good air movement bilaterally, CTAB RRR,No Gallops, Rubs or new Murmurs,  +ve B.Sounds, Abd Soft, No tenderness,   Right AKA stump site under bandage, right femoral cath site stable under bandage, left transmetatarsal amputation old, L hand 3 fingers, R hand 2 fingers, both hands partial digit amputation sites under bandage, left arm  AV fistula present .   Data Review:    CBC Recent Labs  Lab 01/09/22 0309 01/09/22 2245 01/10/22 9833 01/10/22 1201 01/11/22 0239 01/11/22 1755 01/12/22 0041 01/13/22 0055  WBC 12.2*   < > 10.5 11.8* 9.9 9.0 8.1 10.4  HGB 7.4*   < > 7.5* 7.6* 7.9* 8.7* 7.8* 8.5*  HCT 22.4*   < > 22.6* 22.6* 22.4* 26.2* 24.7* 26.2*  PLT 225   < > 201 214 241 281 263 297  MCV 99.6   < > 97.8 97.4 95.3 97.8 98.4 98.5  MCH 32.9   < > 32.5 32.8 33.6 32.5 31.1 32.0  MCHC 33.0   < > 33.2 33.6 35.3 33.2 31.6 32.4  RDW 15.9*   < > 17.0* 17.0* 16.8* 16.7* 16.4* 16.3*  LYMPHSABS 0.6*  --  0.6*  --  0.8  --  0.7 0.4*  MONOABS 1.0  --  0.9  --  0.6  --  0.7 0.3  EOSABS 0.2  --  0.1  --  0.3  --  0.3 0.0  BASOSABS 0.0  --  0.0  --  0.0  --  0.0 0.0   < > = values in this interval not displayed.    Electrolytes Recent Labs  Lab 01/09/22 0309 01/09/22 1803 01/09/22 2245 01/10/22 3151 01/11/22 0239 01/11/22 1755 01/12/22 0041 01/13/22 0055 01/14/22 0029 01/15/22 0128  NA 134*  --   --  135 136 135 132* 133* 133*  --   K 4.2  --   --  4.0 2.9* 3.9 3.8 5.6* 4.4  --   CL 96*  --   --  95* 97* 95* 95* 96* 94*  --   CO2 24  --   --  '22 27 25 24 26 26  '$ --   GLUCOSE 122*  --   --  88 94 135* 153* 286* 297*  --   BUN 26*  --   --  39* 14 23* 28* 25* 45*  --   CREATININE 6.00*  --   --  8.17* 3.31* 5.19* 5.81* 4.65* 6.62*  --   CALCIUM 8.6*  --   --  8.8* 8.6* 9.1 8.6* 8.7* 9.9  --   AST 35  --   --  48* 34  --  24 31  --   --   ALT 17  --   --  15 12  --  8 12  --   --   ALKPHOS 70  --   --  80 108  --  112 108  --   --   BILITOT 1.0  --   --  1.5* 0.7  --  0.8 1.3*  --   --   ALBUMIN 2.5*  --   --  2.5* 2.5* 2.7* 2.4* 2.4* 2.5*  --   MG 2.0  --   --  2.1 2.1  --  2.0  --   --   --   CRP 21.8*  --   --  25.5* 27.8*  --  21.3*  --   --   --   PROCALCITON 8.75  --   --  13.89 15.25  --  11.14 7.00  --  4.08  LATICACIDVEN  --  5.8* 1.6  --   --   --   --   --   --   --      ------------------------------------------------------------------------------------------------------------------ No results for input(s): CHOL, HDL, LDLCALC, TRIG, CHOLHDL, LDLDIRECT in the last 72 hours.   Lab Results  Component Value Date   HGBA1C 6.5 (H) 01/07/2022   Signature  Lala Lund M.D on 01/15/2022 at 10:46 AM   -  To page go to [email or web address]

## 2022-01-15 NOTE — Progress Notes (Signed)
Recheck, pt CBG 30. AOx4. Patent airway. D50 given. See MAR  2 Apple juices provided as well.

## 2022-01-15 NOTE — Progress Notes (Signed)
Pt to HD

## 2022-01-15 NOTE — Progress Notes (Addendum)
Subjective: Seen at start of hemodialysis , no  complaints  Objective Vital signs in last 24 hours: Vitals:   01/15/22 1051 01/15/22 1149 01/15/22 1216 01/15/22 1229  BP: 115/68 120/76  140/75  Pulse:  63    Resp: '15 16 10 17  '$ Temp: 98.7 F (37.1 C) 97.8 F (36.6 C)    TempSrc: Oral Oral    SpO2: 100%  98% 100%  Weight:      Height:       Weight change:   Physical Exam: General: Alert, male pleasant chronically ill-appearing ,NAD Heart: Rare irregular with rate stable in 60s no MRG Lungs: CTA B, nonlabored breathing on room air Abdomen: Obese, NABS soft NT ND Extremities: Right AKA trace edema, left TMA dressing dry clean, left hand dressing dry and clean Dialysis Access: Positive bruit left arm AV fistula   Out Patient dialysis Orders: TTS [facility]  4.5h  425/700  94kg  2/2 bath  LUA AVF   Heparin 8000+ 2031mdrun prn - venofer 100 mg tiw IV , 5/04- 5/25 - mircera 30 ug q4 wks, last 5/4, due early June - hectorol 11 ug tiw IV - last HD 5/20, 93.3 > 92.0 kg - Hb 9.9 on 5/18       Problem/Plan: R BKA stump pain/ severe PAD/Possible cellulits + necrotic fingers- s/p AKA on 01/08/22 by Dr. CDonzetta Matters BC NGTD. Remains on vanc and meropenam.  And appears stable noted ,had "episode" on 5/24-  confusion/hypotension/fever-  now resolved-   ESRD - on HD TTS.  HD today on schedule  via AVF.  Labs pending predialysis  K4.4 yesterday Hyperkalemia - had resolved.-- but K of 5.6 AM 5/28- given Lokelma and resolved K4.4 =01/14/2022 BP/ vol - BP's soft to normal.  This a.m. BP stable, initially weights over EDW ,now under/ Using bed weights/ UF as tolerated.  Will need new lower EDW  now post AKA.--     no BP meds Severe PAD - sp L TMA, multiple amputations of L hand-status post 5/27 =left index finger amputation and right index, long ,and small digit amputation bilaterally by Dr. KFredna Dowtoday okay for discharge from pain standpoint follow-up in office later this week//VVS involved-  said if  continues to have issues may need ligation of AVF.  CAD/ ICM / combined CHF - EF 40-45%  Mobitz type I second-degree heart block (admit team team noted) on telemetry and EKG 5/29= no rate controlling agents asymptomatic, they requested cardiology evaluation Anemia esrd - Hb drop to 7.4 post surgery-5/28 up to 8.5 /lab pending this a.m. Hold iron d/t potential infection.  on ESA -Aranesp 100 mcg.  Given 5/26, transfuse prn for Hgb<7-  MBD ckd -corrected Ca 11.1, phos last 4.3. Cont velphoro as binder and hold IV vdra with elevated calcium Nutrition - renal diet w/fluid restrictions. Alb 2.5 - give protein supplements DM2 - per pmd  DErnest Haber PA-C [facility] [phone]/30/2023,1:01 PM  LOS: 8 days   Labs: Basic Metabolic Panel: Recent Labs  Lab 01/11/22 1755 01/12/22 0041 01/13/22 0055 01/14/22 0029  NA 135 132* 133* 133*  K 3.9 3.8 5.6* 4.4  CL 95* 95* 96* 94*  CO2 '25 24 26 26  '$ GLUCOSE 135* 153* 286* 297*  BUN 23* 28* 25* 45*  CREATININE 5.19* 5.81* 4.65* 6.62*  CALCIUM 9.1 8.6* 8.7* 9.9  PHOS 3.2  --   --  4.3   Liver Function Tests: Recent Labs  Lab 01/11/22 0239 01/11/22 1755  01/12/22 0041 01/13/22 0055 01/14/22 0029  AST 34  --  24 31  --   ALT 12  --  8 12  --   ALKPHOS 108  --  112 108  --   BILITOT 0.7  --  0.8 1.3*  --   PROT 6.8  --  6.3* 6.5  --   ALBUMIN 2.5*   < > 2.4* 2.4* 2.5*   < > = values in this interval not displayed.   No results for input(s): LIPASE, AMYLASE in the last 168 hours. No results for input(s): AMMONIA in the last 168 hours. CBC: Recent Labs  Lab 01/10/22 1201 01/11/22 0239 01/11/22 1755 01/12/22 0041 01/13/22 0055  WBC 11.8* 9.9 9.0 8.1 10.4  NEUTROABS  --  8.0*  --  6.3 9.6*  HGB 7.6* 7.9* 8.7* 7.8* 8.5*  HCT 22.6* 22.4* 26.2* 24.7* 26.2*  MCV 97.4 95.3 97.8 98.4 98.5  PLT 214 241 281 263 297   Cardiac Enzymes: No results for input(s): CKTOTAL, CKMB, CKMBINDEX, TROPONINI in the last 168  hours. CBG: Recent Labs  Lab 01/15/22 0618 01/15/22 1002 01/15/22 1018 01/15/22 1048 01/15/22 1207  GLUCAP 128* 48* 30* 92 104*    Studies/Results: No results found. Medications:  sodium chloride     sodium chloride     sodium chloride 250 mL (01/12/22 2356)   meropenem (MERREM) IV 500 mg (01/14/22 2015)   vancomycin      aspirin EC  81 mg Oral Daily   atorvastatin  80 mg Oral Daily   Chlorhexidine Gluconate Cloth  6 each Topical Q0600   clopidogrel  75 mg Oral Daily   darbepoetin (ARANESP) injection - DIALYSIS  100 mcg Intravenous Q Thu-HD   doxercalciferol  11 mcg Intravenous Q T,Th,Sa-HD   ezetimibe  10 mg Oral Daily   feeding supplement (NEPRO CARB STEADY)  237 mL Oral BID BM   gabapentin  100 mg Oral BID   heparin  5,000 Units Subcutaneous Q8H   insulin aspart  0-9 Units Subcutaneous TID WC   insulin aspart protamine- aspart  20 Units Subcutaneous BID WC   linaclotide  72 mcg Oral QAC breakfast   pantoprazole  40 mg Oral BID   polyethylene glycol  17 g Oral Daily   sodium zirconium cyclosilicate  10 g Oral Daily   sucroferric oxyhydroxide  1,000 mg Oral TID WC

## 2022-01-15 NOTE — Progress Notes (Signed)
Called HD to relay providers request for CBG recheck at 1130. Secretary said she would relay message.

## 2022-01-15 NOTE — Significant Event (Signed)
Patient had intermittent irregular heart rhythm.  EKG was ordered which shows Mobitz type I second-degree heart block.  I discussed with on-call cardiologist Dr. Clayton Bibles.  Cardiologist at this time feels the rhythm is more of Mobitz type I second-degree heart block.  Patient's heart rate at this time is around 70 bpm and systolic blood pressures around 130/80.  We will continue to monitor.  Dr. Hal Hope

## 2022-01-15 NOTE — Progress Notes (Signed)
C/o chest pain uf off 100 bolus given will continue to monitor

## 2022-01-15 NOTE — Progress Notes (Signed)
   01/15/22 1630  Vitals  Temp 98.5 F (36.9 C)  Temp Source Oral  BP (!) 128/58  MAP (mmHg) 76  BP Location Right Arm  BP Method Automatic  Patient Position (if appropriate) Lying  Pulse Rate 78  Pulse Rate Source Monitor  ECG Heart Rate 75  Resp 14  Oxygen Therapy  SpO2 100 %  O2 Device Room Air  Post Treatment  Rinseback Volume (mL) 300 mL  Dialyzer Clearance Clear  Duration of HD Treatment -hour(s) 4 hour(s)  Hemodialysis Intake (mL) 0 mL  UF Total -Machine (mL) 2.5 mL  Net UF (mL) 2.5 mL  Tolerated HD Treatment Yes  Post-Hemodialysis Comments met 2.5 l goal, pt tolerated well vss no resp distress noted  AVG/AVF Arterial Site Held (minutes) 5 minutes  AVG/AVF Venous Site Held (minutes) 5 minutes  Fistula / Graft Left Upper arm Arteriovenous fistula  Placement Date/Time: 11/25/19 1250   Placed prior to admission: No  Orientation: Left  Access Location: Upper arm  Access Type: (c) Arteriovenous fistula  Site Condition No complications  Fistula / Graft Assessment Present;Thrill;Bruit  Status Deaccessed;Flushed  Hand-Off documentation  Report given to (Full Name) Evelene Croon RN  Report received from (Full Name) Sherren Mocha  Handoff Given Given to shift RN/LPN  PAINAD (Pain Assessment in Advanced Dementia)  Breathing 0   Goal met tolerated well no s/s of distress to note

## 2022-01-16 ENCOUNTER — Other Ambulatory Visit (HOSPITAL_COMMUNITY): Payer: Self-pay

## 2022-01-16 LAB — GLUCOSE, CAPILLARY
Glucose-Capillary: 158 mg/dL — ABNORMAL HIGH (ref 70–99)
Glucose-Capillary: 71 mg/dL (ref 70–99)
Glucose-Capillary: 156 mg/dL — ABNORMAL HIGH (ref 70–99)

## 2022-01-16 LAB — PROCALCITONIN: Procalcitonin: 2.42 ng/mL

## 2022-01-16 MED ORDER — EZETIMIBE 10 MG PO TABS
10.0000 mg | ORAL_TABLET | Freq: Every day | ORAL | 3 refills | Status: DC
  Filled 2022-01-16: qty 30, 30d supply, fill #0

## 2022-01-16 MED ORDER — INSULIN ASPART PROT & ASPART (70-30 MIX) 100 UNIT/ML ~~LOC~~ SUSP
15.0000 [IU] | Freq: Two times a day (BID) | SUBCUTANEOUS | Status: DC
  Administered 2022-01-16: 15 [IU] via SUBCUTANEOUS

## 2022-01-16 MED ORDER — TRAMADOL HCL 50 MG PO TABS
50.0000 mg | ORAL_TABLET | Freq: Two times a day (BID) | ORAL | 0 refills | Status: AC | PRN
  Filled 2022-01-16: qty 10, 5d supply, fill #0

## 2022-01-16 MED ORDER — VANCOMYCIN HCL 750 MG/150ML IV SOLN
750.0000 mg | Freq: Once | INTRAVENOUS | Status: DC
Start: 2022-01-16 — End: 2022-01-16
  Administered 2022-01-16: 750 mg via INTRAVENOUS
  Filled 2022-01-16: qty 150

## 2022-01-16 NOTE — TOC Progression Note (Addendum)
Transition of Care [facility]) - Progression Note    Patient Details  Name: Edward [name] MRN: [id number] Date of Birth: [birth year]  [facility]) CM/SW [place], RN Phone Number:[phone]  01/16/2022, 9:58 AM  Clinical Narrative:    CM continues to attempt to set up home health. No accepting Agency. Outpatient may be an option. Home health referral sent to [facility]. Cory with [facility] is not able to accept patient for home health services at this time. [facility] unable to accept home health referral.  1150 CM at bedside to make patient aware that there is currently no accepting agency for home health care. Dr. Loleta Books at bedside and has been made aware also. Per MD there is no wound care needed for this patient . We currently need home health PT. CM discussed outpatient option with the patient just incase CM is unable to secure home health referral. Patient states that outpatient would not be an option because patient has hemodialysis 3 days per week. Patient does state that of CM is unable to secure home health that he feels that he will be able top manage at home and that he will have someone with him at all times.  CM has reached out [facility] to make them aware that patient does not need RN care to see if agency is willing to accept [facility] referral. Awaiting return call.   [facility] [place] referral hasa been accepted by [place] with [facility]. AVS updated.  Expected Discharge Plan: [place] Barriers to Discharge: Continued Medical Work up  Expected Discharge Plan and Services Expected Discharge Plan: [place] In-house Referral: NA Discharge Planning Services: CM Consult Post Acute Care Choice: [place] arrangements for the past 2 months: Single Family Home                 DME Arranged: N/A DME Agency: NA                   Social Determinants of Health (SDOH) Interventions    Readmission Risk  Interventions    01/14/2022   12:26 PM  Readmission Risk Prevention Plan  Transportation Screening Complete  PCP or Specialist Appt within 3-5 Days Complete  HRI or Home Care Consult Complete  Social Work Consult for [place] Planning/Counseling Complete  Palliative Care Screening Not Applicable  Medication Review Press photographer) Referral to Pharmacy

## 2022-01-16 NOTE — Progress Notes (Signed)
Occupational Therapy Treatment Patient Details Name: Edward [name] MRN: [id number] DOB: 04-23-1971 Today's Date: 01/16/2022   History of present illness Pt is a 51 y.o. 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 S/p transition of R BKA to R AKA on 5/23. Course complicated by hypotension, acute metabolic encephalopathy. Also with heart block. PMH includes PAD, multiple finger amputations, ESRD (HD TTS), DM2, CAD, CHF, AAA, orthostatic hypotension.   OT comments  Patient performed grooming seated on EOB with assistance with toothpaste. Patient performed lateral scoot transfer into wheelchair with min guard assist and attempted wheelchair mobility but unable to perform with BUEs. Patient going home with sons and recommend [facility]. Patient instructed on exercises and activities to perform to increase mobility and safety.    Recommendations for follow up therapy are one component of a multi-disciplinary discharge planning process, led by the attending physician.  Recommendations may be updated based on patient status, additional functional criteria and insurance authorization.    Follow Up Recommendations  Home health OT    Assistance Recommended at Discharge Intermittent Supervision/Assistance  Patient can return home with the following  A little help with walking and/or transfers;A little help with bathing/dressing/bathroom;Help with stairs or ramp for entrance;Assist for transportation   Equipment Recommendations  None recommended by OT    Recommendations for Other Services      Precautions / Restrictions Precautions Precautions: Fall Restrictions Weight Bearing Restrictions: No Other Position/Activity Restrictions: multiple amputations of LE and UE.       Mobility Bed Mobility Overal bed mobility: Needs Assistance             General bed mobility comments: Sitting EOB upon arrival.     Transfers Overall transfer level: Needs assistance Equipment used: None Transfers: Bed to chair/wheelchair/BSC            Lateral/Scoot Transfers: Min guard General transfer comment: min guard with lateral scoot transfer to/form wheelchair to/from bed     Balance Overall balance assessment: Needs assistance Sitting-balance support: No upper extremity supported Sitting balance-Leahy Scale: Good Sitting balance - Comments: able to maintain balance without physical assistance                                   ADL either performed or assessed with clinical judgement   ADL Overall ADL's : Needs assistance/impaired     Grooming: Wash/dry hands;Wash/dry face;Oral care;Minimal assistance;Sitting Grooming Details (indicate cue type and reason): patient required assistance to open toothpaste                 Toilet Transfer: Magazine features editor Details (indicate cue type and reason): simulated to wheelchair with lateral scoot transfer           General ADL Comments: scoot transfer to wheelchair with min guard assist    Extremity/Trunk Assessment              Vision       Perception     Praxis      Cognition Arousal/Alertness: Awake/alert Behavior During Therapy: WFL for tasks assessed/performed Overall Cognitive Status: Within Functional Limits for tasks assessed                                 General Comments: aware of deficits  Exercises      Shoulder Instructions       General Comments wheelchair mobility attempted with patient having difficulty due to BUE hand pain    Pertinent Vitals/ Pain       Pain Assessment Pain Assessment: Faces Faces Pain Scale: Hurts even more Pain Location: Rt residual limb and Bil hands Pain Descriptors / Indicators: Sore, Discomfort Pain Intervention(s): Limited activity within patient's tolerance, Monitored during session  Home Living                                           Prior Functioning/Environment              Frequency  Min 2X/week        Progress Toward Goals  OT Goals(current goals can now be found in the care plan section)  Progress towards OT goals: Progressing toward goals  Acute Rehab OT Goals OT Goal Formulation: With patient Time For Goal Achievement: 01/23/22 Potential to Achieve Goals: Good ADL Goals Pt Will Perform Grooming: with set-up;standing;sitting Pt Will Perform Upper Body Dressing: with set-up;sitting Pt Will Perform Lower Body Dressing: with set-up;sitting/lateral leans Pt Will Transfer to Toilet: with supervision;squat pivot transfer;regular height toilet;bedside commode  Plan Discharge plan remains appropriate    Co-evaluation                 AM-PAC OT "6 Clicks" Daily Activity     Outcome Measure   Help from another person eating meals?: None Help from another person taking care of personal grooming?: None Help from another person toileting, which includes using toliet, bedpan, or urinal?: A Lot Help from another person bathing (including washing, rinsing, drying)?: A Lot Help from another person to put on and taking off regular upper body clothing?: A Little Help from another person to put on and taking off regular lower body clothing?: A Lot 6 Click Score: 17    End of Session Equipment Utilized During Treatment: Other (comment) (wheelchair)  OT Visit Diagnosis: Unsteadiness on feet (R26.81);Pain Pain - Right/Left: Right Pain - part of body: Leg   Activity Tolerance Patient tolerated treatment well   Patient Left in bed;with call bell/phone within reach;with family/visitor present   Nurse Communication Mobility status        Time: 1101-1145 OT Time Calculation (min): 44 min  Charges: OT General Charges $OT Visit: 1 Visit OT Treatments $Self Care/Home Management : 8-22 mins $Therapeutic Activity: 23-37 mins  Lodema Hong, OTA Acute Rehabilitation  Services  Pager [phone] Office [place] 01/16/2022, 1:05 PM

## 2022-01-16 NOTE — Discharge Summary (Signed)
Physician Discharge Summary   Patient: Edward [name] MRN: [id number] DOB: 19-Jan-1971  Admit date:     01/07/2022  Discharge date: 01/16/22  Discharge Physician: Edwin Dada   PCP: [name], MD     Recommendations at discharge:  Follow up with Dr. Donzetta Matters Vascular Surgery in 2-3 weeks as directed Follow up with Dr. Luvenia Starch Surgery in 1 week as directed        Hospital Course: Mr. Peed is a 51 y.o. M with severe PVD s/p left transmetatarsal amputation, right BKA with necrotic stump, multiple amputation in left hand fingers, ESRD on HD TThS, DM, combined systolic and diastolic CHF EF 51%, orthostatic hypotension of chronic disease, AAA, recent Zio patch placement, who was brought in to the Cath Lab for elective bilateral lower extremity angiogram which was done by Dr. Donzetta Matters to evaluate ongoing right BKA stump pain and necrosis.  After the procedure, the hospitalist team was requested to admit the patient for inpatient treatment of stump necrosis.        Discharge Diagnoses: Right BKA ischemia and necrosis Sepsis due to stump necrosis Severe peripheral vascular disease Now s/p RIGHT AKA  Admitted and underwent right AKA.  Completed 7 days meropenem.  Patient now mentating at baseline, taking orals.  Temp < 100 F, heart rate < 100bpm.   Stable for discharge.      Discharged on statin and Zetia.        Digital ischemia, s/p multiple amputations of left upper extremity fingers Underwent on 01/12/2022 - 1.  Left index finger amputation at MP joint 2.  Right index finger amputation through proximal phalanx 3.  Right long finger amputation through proximal phalanx 4.  Right small finger amputation through PIP joint     End stage renal disease   Coroanry artery disease with ischemic cardiomyopathy Asymtpomatic. On DAPT with statin, Zetia added.  Has chronic orthostatic hypotension precluding the use of beta-blockers.   Chronic combined systolic and diastolic CHF  with EF 89% Has chronic hypotension which precludes the use of beta-blocker, ACE/ARB.   Mobitz type I second-degree heart block noted on telemetry and EKG on 01/14/2022 Previoulsuly present, evaluated by Cardiology here, no further work up needed   Anemia of chronic disease with some perioperative blood loss related anemia along with heme dilution from IV fluids.  Due to ESRD.  On iron supplementation with HD treatments, received 1 unit of packed RBC early morning 01/10/2022    Hypokalemia   Dyslipidemia    Diabetes          The [place] Registry was reviewed for this patient prior to discharge.   Consultants: Orthopedics, Vascular surgery, Crtiical care   Disposition: Home   DISCHARGE MEDICATION: Allergies as of 01/16/2022       Reactions   Crestor [rosuvastatin] Rash   Also had a red eye   Dilaudid [hydromorphone] Itching   Benadryl [diphenhydramine] Rash        Medication List     TAKE these medications    acetaminophen 500 MG tablet Commonly known as: TYLENOL Take 500-1,000 mg by mouth every 6 (six) hours as needed for moderate pain or headache.   aspirin EC 81 MG tablet Take 1 tablet (81 mg total) by mouth daily. Swallow whole.   atorvastatin 80 MG tablet Commonly known as: LIPITOR Take 80 mg by mouth daily.   bismuth subsalicylate 842 JI/51YO suspension Commonly known as: PEPTO BISMOL Take 30 mLs by mouth every 6 (  six) hours as needed for indigestion.   blood glucose meter kit and supplies Dispense based on patient and insurance preference. Use up to four times daily as directed. (FOR ICD-9 250.00, 250.01).   clopidogrel 75 MG tablet Commonly known as: Plavix Take 1 tablet (75 mg total) by mouth daily.   ezetimibe 10 MG tablet Commonly known as: ZETIA Take 1 tablet (10 mg total) by mouth daily. Start taking on: January 17, 2022   gabapentin 100 MG capsule Commonly known as: NEURONTIN Take 100 mg by mouth 3 (three)  times daily.   Insulin Pen Needle 31G X 5 MM Misc Use daily to inject insulin as instructed   lidocaine-prilocaine cream Commonly known as: EMLA Apply 1 application topically See admin instructions. Apply a small amount to access site (AVF) 1-2 hours before dialysis. Cover with occlusive dressing (saran wrap). Tuesday,Thursday,saturday   linaclotide 72 MCG capsule Commonly known as: LINZESS Take 1 capsule (72 mcg total) by mouth daily before breakfast. What changed:  when to take this reasons to take this   NovoLOG Mix 70/30 FlexPen (70-30) 100 UNIT/ML FlexPen Generic drug: insulin aspart protamine - aspart Inject 0.3 mLs (30 Units total) into the skin 2 (two) times daily with a meal.   pantoprazole 40 MG tablet Commonly known as: PROTONIX Take 1 tablet (40 mg total) by mouth in the morning and at bedtime. What changed: when to take this   traMADol 50 MG tablet Commonly known as: ULTRAM Take 1 tablet (50 mg total) by mouth 2 (two) times daily as needed for up to 5 days.   triamcinolone ointment 0.1 % Commonly known as: KENALOG 1 application. daily as needed (rash).   Velphoro 500 MG chewable tablet Generic drug: sucroferric oxyhydroxide Chew 1,000 mg by mouth 3 (three) times daily with meals.               Discharge Care Instructions  (From admission, onward)           Start     Ordered   01/16/22 0000  Discharge wound care:       Comments: Keep hand incisions covered with gauze and Coban wrap. If Coban gets dirty, can remove dressings, clean with soap and water and reapply gauze and coban. Go see Dr. Fredna Dow in 1 week, call his office for an apoointment.  Keep right leg incision covered with gauze and tape down securely.  If dressing gets dirty, can remove and replace. Clean with soap and water and pat dry gently Call Dr. Claretha Cooper office by June 12 to see when they want you to come in for staple removal.   01/16/22 1232            Follow-up  Information     [facility] Follow up in 1 month(s).   Why: Staple removal. The office will call the patient with an appointment. Contact information: [address] [place] [phone]        Leanora Cover, MD Follow up in 1 week(s).   Specialty: Orthopedic Surgery Contact information: [place] [address] [place], [place] [facility] Follow up.   Specialty: Home Health Services Why: Your home health has been set up with [facility]. The office will call you with start of service information. If you have any questions or concerns please cal number listed above. Contact information: [place] [address] [phone]  Discharge Instructions     Discharge instructions   Complete by: As directed    From Dr. Nelva Bush were admitted for infection in your leg. This required additional amputation. You were treated with antibiotics for 7 days.  See the wound care instructions below. If you *do* need to change the dressing, you may wash gently with soap and water and pat dry carefully, then reapply the dressing.  Call dr. Fredna Dow for the hands within 1 week Call dr. Donzetta Matters for the leg check up (which I believe he wants within the next month)  For pain, take tramadol 50 mg up to twice daily, take with acetaminophen, as it will work better  [employer] the new cholesterol medicine Zetia, from now on   Discharge wound care:   Complete by: As directed    Keep hand incisions covered with gauze and Coban wrap. If Coban gets dirty, can remove dressings, clean with soap and water and reapply gauze and coban. Go see Dr. Fredna Dow in 1 week, call his office for an apoointment.  Keep right leg incision covered with gauze and tape down securely.  If dressing gets dirty, can remove and replace. Clean with soap and water and pat dry gently Call Dr. Claretha Cooper  office by June 12 to see when they want you to come in for staple removal.   Increase activity slowly   Complete by: As directed        Discharge Exam: Filed Weights   01/11/22 0600 01/12/22 1603 01/12/22 1930  Weight: 88.3 kg 90.3 kg 89.2 kg    General: Pt is alert, awake, not in acute distress Cardiovascular: RRR, nl S1-S2, no murmurs appreciated.   No LE edema.   Respiratory: Normal respiratory rate and rhythm.  CTAB without rales or wheezes. Abdominal: Abdomen soft and non-tender.  No distension or HSM.   Neuro/Psych: Strength symmetric in upper and lower extremities.  Judgment and insight appear normal.   Condition at discharge: good  The results of significant diagnostics from this hospitalization (including imaging, microbiology, ancillary and laboratory) are listed below for reference.   Imaging Studies: PERIPHERAL VASCULAR CATHETERIZATION  Result Date: 01/07/2022 Images from the original result were not included. Patient name: Edward [name] MRN: [id number] DOB: 1971/03/02 Sex: male 01/07/2022 Pre-operative Diagnosis: End-stage renal disease, necrotic bilateral fingers, necrotic right below-knee amputation, left lower extremity wound Post-operative diagnosis:  Same Surgeon:  Eda Paschal. Donzetta Matters, MD Procedure Performed: 1.  Ultrasound-guided cannulation right common femoral artery 2.  Arch aortogram 3.  Selection of bilateral axillary arteries and upper bilateral extremity angiography 4.  Bilateral lower extremity angiography Indications: 51 year old male with history of right below-knee amputation which is now healing.  He also has amputations of the left fingers and now with new necrosis of fingers on the right.  Wound on the left foot is healing but the right below-knee amputation is necrotic.  He is now indicated for angiography and possible intervention of all extremities. Findings: The arch of his aorta appears to be a type I arch.  The right upper extremity there is a small lesion  in the brachial artery just after the takeoff of a high radial artery.  He does not have any ulnar artery flow to his right hand that is appreciable.  The radial artery has very sluggish flow down to the level of the wrist could not get any flow into the hand.  The interosseous artery flows toe about the level of the wrist and then stops.  No  intervention was undertaken there.  On the left side there is no flow-limiting stenosis throughout the upper extremity he does have radial artery dominance which goes to the level of the wrist at least but is difficult given the fistula.  If there is persistent wounds of the left hand we may need to consider ligation of the left arm fistula given that I do not think he would be a good candidate for any extensive revascularization of his left upper extremity.  The aorta in the abdomen is free of flow-limiting stenosis.  The SMA is patent.  Bilateral renal arteries consistent with dialysis.  Bilateral common iliac arteries are patent and there is disease in the bilateral hypogastric arteries but they are both patent.  On the right side the common femoral artery is patent with profunda flow in the SFA is patent down to the level of above the knee where there is no flow into the below-knee area consistent with necrosis of the below-knee amputation.  On the left side the common femoral and SFA are patent.  There is a 50% stenosis of the popliteal artery and then appears to have dominant flow via the peroneal artery to a transmetatarsal amputation on the left.  No intervention was undertaken. Plan will be for right above-knee amputation tomorrow in the OR.  Procedure:  The patient was identified in the holding area and taken to room 8.  The patient was then placed supine on the table and prepped and draped in the usual sterile fashion.  A time out was called.  Ultrasound was used to evaluate the right common femoral artery.  The area was thickened but we are able to anesthetize this  area to cannulate the artery with micropuncture needle followed by wire and sheath.  Images saved department record.  We placed a 5 French sheath over a Bentson wire followed by pigtail catheter to the aortic arch and patient is given 3000's of heparin.  We performed arch angiography then selected the bilateral subclavian and axillary arteries and performed bilateral upper extremity angiography without intervention.  We then put a pigtail in the abdominal aorta performed bilateral lower extremity angiography after aortic angiography and there is no intervention there either.  Plan will be for right above-knee amputation tomorrow in the OR.  Catheter was removed over wire.  Sheath will be pulled in postoperative holding.  He tolerated procedure without any complication. Contrast: 170cc Brandon C. Donzetta Matters, MD [facility] [place] Office: [phone] Pager: [phone]   DG Chest Port 1 View  Result Date: 01/09/2022 CLINICAL DATA:  Shortness of breath EXAM: PORTABLE CHEST 1 VIEW COMPARISON:  Chest x-ray dated Jan 08, 2022 FINDINGS: Unchanged cardiomegaly. Mild bilateral heterogeneous opacities. No large pleural effusion or pneumothorax. IMPRESSION: 1. Mild bilateral heterogeneous opacities, likely due to pulmonary edema. 2. Unchanged cardiomegaly. Electronically Signed   By: Yetta Glassman M.D.   On: 01/09/2022 13:54   DG Chest Port 1 View  Result Date: 01/08/2022 CLINICAL DATA:  Shortness of breath. EXAM: PORTABLE CHEST 1 VIEW COMPARISON:  09/06/2021 FINDINGS: 0621 hours. The cardio pericardial silhouette is enlarged. The lungs are clear without focal pneumonia, edema, pneumothorax or pleural effusion. The visualized bony structures of the thorax are unremarkable. Telemetry leads overlie the chest. IMPRESSION: Stable enlargement of the cardiopericardial silhouette. Otherwise no acute findings. Electronically Signed   By: Misty Stanley M.D.   On: 01/08/2022 06:38   VAS [place] UPPER  EXTREMITY ARTERIAL DUPLEX  Result Date: 12/26/2021  UPPER  EXTREMITY DUPLEX STUDY Patient Name:  CHRLES SELLEY  Date of Exam:   12/26/2021 Medical Rec #: [id number]       Accession #:    [id number] Date of Birth: [birth year]       Patient Gender: M Patient Age:   69 years Exam Location:  [facility] Procedure:      VAS [place] UPPER EXTREMITY ARTERIAL DUPLEX Referring Phys: Servando Snare --------------------------------------------------------------------------------  Indications: Ischemic, cold, painful first two fingers on the right x 2 months.  Risk Factors:  Hypertension, Diabetes, coronary artery disease. Left upper                extremity AVF Other Factors: Atrial Fibrillation, TIA Performing Technologist: Alvia Grove RVT  Examination Guidelines: A complete evaluation includes B-mode imaging, spectral Doppler, color Doppler, and power Doppler as needed of all accessible portions of each vessel. Bilateral testing is considered an integral part of a complete examination. Limited examinations for reoccurring indications may be performed as noted.  Right Doppler Findings: +---------------+----------+----------+--------+--------------------------+ Site           PSV (cm/s)Waveform  StenosisComments                   +---------------+----------+----------+--------+--------------------------+ Subclavian Prox75        triphasic                                    +---------------+----------+----------+--------+--------------------------+ Subclavian Mid 78        triphasic                                    +---------------+----------+----------+--------+--------------------------+ Subclavian Dist65        triphasic                                    +---------------+----------+----------+--------+--------------------------+ Axillary       65        triphasic                                    +---------------+----------+----------+--------+--------------------------+ Brachial  Prox  64        triphasic                                    +---------------+----------+----------+--------+--------------------------+ Brachial Mid   53        triphasic                                    +---------------+----------+----------+--------+--------------------------+ Brachial Dist  57        triphasic                                    +---------------+----------+----------+--------+--------------------------+ Radial Prox    44        triphasic                                    +---------------+----------+----------+--------+--------------------------+ Radial  Mid     32        triphasic                                    +---------------+----------+----------+--------+--------------------------+ Radial Dist    23 / 0    monophasic        occlusion / near occlusion +---------------+----------+----------+--------+--------------------------+ Ulnar Prox     64        triphasic                                    +---------------+----------+----------+--------+--------------------------+ Ulnar Mid      42        triphasic                                    +---------------+----------+----------+--------+--------------------------+ Ulnar Dist     50        triphasic                                    +---------------+----------+----------+--------+--------------------------+   Left Doppler Findings: +------------+----------+--------+--------+--------+ Site        PSV (cm/s)WaveformStenosisComments +------------+----------+--------+--------+--------+ Brachial Mid32        biphasic        broad    +------------+----------+--------+--------+--------+   Summary:  Right: No color flow or Doppler signal in the distal radial artery        consistent with occlusion / near occlusion, limited        visualization. *See table(s) above for measurements and observations. Electronically signed by Servando Snare MD on 12/26/2021 at 3:32:17 PM.    Final     LONG TERM MONITOR (3-14 DAYS)  Result Date: 01/01/2022 Patch Wear Time:  6 days and 2 hours (2023-05-03T15:55:23-0400 to 2023-05-09T18:20:23-0400) Patient had a min HR of 42 bpm, max HR of 231 bpm, and avg HR of 84 bpm. Predominant underlying rhythm was Sinus Rhythm. First Degree AV Block was present. 1 run of Ventricular Tachycardia occurred lasting 5 beats with a max rate of 231 bpm (avg 193 bpm). 1 run of Supraventricular Tachycardia occurred lasting 6 beats with a max rate of 124 bpm (avg 115 bpm). Second Degree AV Block-Mobitz I (Wenckebach) was present. Wenckebach was detected within +/- 45 seconds of symptomatic patient event(s). Isolated SVEs were rare (<1.0%), SVE Couplets were rare (<1.0%), and SVE Triplets were rare (<1.0%). Isolated VEs were occasional (1.4%, 9715), VE Couplets were rare (<1.0%, 87), and VE Triplets were rare (<1.0%, 15). Ventricular Trigeminy was present. Sinus bradycardia, NSR, occasional PAC, brief PAT, 5 beats NSVT and occasional Mobitz 1 second degree AV block. Kirk Ruths    Microbiology: Results for orders placed or performed during the hospital encounter of 01/07/22  MRSA Next Gen by PCR, Nasal     Status: None   Collection Time: 01/08/22  5:47 AM   Specimen: Nasal Mucosa; Nasal Swab  Result Value Ref Range Status   MRSA by PCR Next Gen NOT DETECTED NOT DETECTED Final    Comment: (NOTE) The GeneXpert MRSA Assay (FDA approved for NASAL specimens only), is one component of a comprehensive MRSA colonization surveillance program. It is not intended to diagnose MRSA infection nor to guide or monitor treatment  for MRSA infections. Test performance is not FDA approved in patients less than 27 years old. Performed at [facility], [place] [address], [place], [place] [address]   Culture, blood (Routine X 2) w Reflex to ID Panel     Status: None   Collection Time: 01/08/22  6:44 AM   Specimen: BLOOD RIGHT WRIST  Result Value Ref Range Status   Specimen  Description BLOOD RIGHT WRIST  Final   Special Requests   Final    BOTTLES DRAWN AEROBIC AND ANAEROBIC Blood Culture adequate volume   Culture   Final    NO GROWTH 5 DAYS Performed at [facility], [place] [address], [place], [place] [address]    Report Status 01/13/2022 FINAL  Final  Culture, blood (Routine X 2) w Reflex to ID Panel     Status: None   Collection Time: 01/08/22  6:52 AM   Specimen: BLOOD RIGHT FOREARM  Result Value Ref Range Status   Specimen Description BLOOD RIGHT FOREARM  Final   Special Requests   Final    BOTTLES DRAWN AEROBIC AND ANAEROBIC Blood Culture adequate volume   Culture   Final    NO GROWTH 5 DAYS Performed at [facility], [place] [address], [place], [place] [address]    Report Status 01/13/2022 FINAL  Final  MRSA Next Gen by PCR, Nasal     Status: None   Collection Time: 01/09/22  6:03 PM   Specimen: Nasal Mucosa; Nasal Swab  Result Value Ref Range Status   MRSA by PCR Next Gen NOT DETECTED NOT DETECTED Final    Comment: (NOTE) The GeneXpert MRSA Assay (FDA approved for NASAL specimens only), is one component of a comprehensive MRSA colonization surveillance program. It is not intended to diagnose MRSA infection nor to guide or monitor treatment for MRSA infections. Test performance is not FDA approved in patients less than 34 years old. Performed at [facility], [place] [address], [place], [place] [address]     Labs: CBC: Recent Labs  Lab 01/10/22 [phone] 01/10/22 1201 01/11/22 0239 01/11/22 1755 01/12/22 0041 01/13/22 0055  WBC 10.5 11.8* 9.9 9.0 8.1 10.4  NEUTROABS 8.8*  --  8.0*  --  6.3 9.6*  HGB 7.5* 7.6* 7.9* 8.7* 7.8* 8.5*  HCT 22.6* 22.6* 22.4* 26.2* 24.7* 26.2*  MCV 97.8 97.4 95.3 97.8 98.4 98.5  PLT 201 214 241 281 263 765   Basic Metabolic Panel: Recent Labs  Lab 01/10/22 0639 01/11/22 0239 01/11/22 1755 01/12/22 0041 01/13/22 0055 01/14/22 0029  NA 135 136 135 132* 133* 133*  K 4.0 2.9* 3.9 3.8 5.6*  4.4  CL 95* 97* 95* 95* 96* 94*  CO2 22 27 25 24 26 26   GLUCOSE 88 94 135* 153* 286* 297*  BUN 39* 14 23* 28* 25* 45*  CREATININE 8.17* 3.31* 5.19* 5.81* 4.65* 6.62*  CALCIUM 8.8* 8.6* 9.1 8.6* 8.7* 9.9  MG 2.1 2.1  --  2.0  --   --   PHOS  --   --  3.2  --   --  4.3   Liver Function Tests: Recent Labs  Lab 01/10/22 0639 01/11/22 0239 01/11/22 1755 01/12/22 0041 01/13/22 0055 01/14/22 0029  AST 48* 34  --  24 31  --   ALT 15 12  --  8 12  --   ALKPHOS 80 108  --  112 108  --   BILITOT 1.5* 0.7  --  0.8  1.3*  --   PROT 6.3* 6.8  --  6.3* 6.5  --   ALBUMIN 2.5* 2.5* 2.7* 2.4* 2.4* 2.5*   CBG: Recent Labs  Lab 01/15/22 1723 01/15/22 2127 01/16/22 0644 01/16/22 1122 01/16/22 1601  GLUCAP 148* 127* 158* 71 156*    Discharge time spent: approximately 50 minutes spent on discharge counseling, evaluation of patient on day of discharge, and coordination of discharge planning with nursing, social work, pharmacy and case management  Signed: Edwin Dada, MD [facility] 01/16/2022

## 2022-01-16 NOTE — Plan of Care (Signed)

## 2022-01-16 NOTE — Progress Notes (Signed)
D/C order noted. Contacted [facility] to advise clinic of pt's d/c today and that pt will resume care tomorrow.   Marice Guidone Renal Navigator [phone] 

## 2022-01-16 NOTE — Progress Notes (Signed)
Pharmacy Antibiotic Note  Edward [name] is a 51 y.o. male admitted on 01/07/2022 with R BKA stump site discomfort. S/p R AKA 5/23 but pt with elevation in CRP and low-grade fevers. ?low grade cellulitis around amputation site. Pharmacy has been consulted for Vancomycin and meropenem.  Pt continues on HD - T/T/S. Of note, updated weight is down 20 kg from previous weights.   5/26 VR 33: no dose adjustment required  Pt had a shorter HD session on 5/27. A smaller vanc dose was given. He has only one more dose of vanc left and stop date for merrem has been entered. Vancomycin not given in HD 5/30, final dose re-timed for 5/31 1200.  Plan: Meropenem 500 mg IV Q24H until 5/31 Vancomycin '750mg'$  IV x1 on 5/30 post HD >> 5/31    Height: '6\' 1"'$  (185.4 cm) Weight: 89.2 kg (196 lb 10.4 oz) IBW/kg (Calculated) : 79.9  Temp (24hrs), Avg:98.3 F (36.8 C), Min:97.8 F (36.6 C), Max:98.7 F (37.1 C)  Recent Labs  Lab 01/09/22 1803 01/09/22 2245 01/10/22 0639 01/10/22 1201 01/11/22 0239 01/11/22 1242 01/11/22 1755 01/12/22 0041 01/13/22 0055 01/14/22 0029  WBC  --  11.2*   < > 11.8* 9.9  --  9.0 8.1 10.4  --   CREATININE  --   --    < >  --  3.31*  --  5.19* 5.81* 4.65* 6.62*  LATICACIDVEN 5.8* 1.6  --   --   --   --   --   --   --   --   VANCORANDOM  --   --   --   --   --  33  --   --   --   --    < > = values in this interval not displayed.    Estimated Creatinine Clearance: 14.8 mL/min (A) (by C-G formula based on SCr of 6.62 mg/dL (H)).    Allergies  Allergen Reactions   Crestor [Rosuvastatin] Rash    Also had a red eye   Dilaudid [Hydromorphone] Itching   Benadryl [Diphenhydramine] Rash    Antimicrobials this admission:  Zosyn 5/24>> 5/24 Meropenem 5/24>>(5/31) Vanc 5/24>> (5/30)   Microbiology results: 5/23 BCx: NGTD  5/23 MRSA PCR: negative   Thank you for allowing pharmacy to be a part of this patient's care.  Ardyth Harps, PharmD Clinical Pharmacist

## 2022-01-16 NOTE — Progress Notes (Signed)
Physical Therapy Treatment Patient Details Name: Edward [name] MRN: [id number] DOB: May 25, 1971 Today's Date: 01/16/2022   History of Present Illness Pt is a 51 y.o. 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 S/p transition of R BKA to R AKA on 5/23. Course complicated by hypotension, acute metabolic encephalopathy. Also with heart block. PMH includes PAD, multiple finger amputations, ESRD (HD TTS), DM2, CAD, CHF, AAA, orthostatic hypotension.    PT Comments    Patient progressing slowly towards PT goals. Session focused on functional transfers and exercise. Tolerated lateral scoot transfer from bed to/from w/c with Min guard assist and increased time. Fatigues quickly with activity and also limited by pain this session. Worked on strengthening of LLE in w/c. Not able to propel w/c with UEs today due to pain from amputation sites. Noted to have irregular heart rhythm. Plans to d/c home with support of sons. Will follow.    Recommendations for follow up therapy are one component of a multi-disciplinary discharge planning process, led by the attending physician.  Recommendations may be updated based on patient status, additional functional criteria and insurance authorization.  Follow Up Recommendations  Home health PT     Assistance Recommended at Discharge Intermittent Supervision/Assistance  Patient can return home with the following A lot of help with walking and/or transfers;A lot of help with bathing/dressing/bathroom;Assistance with cooking/housework;Assist for transportation;Help with stairs or ramp for entrance   Equipment Recommendations  None recommended by PT    Recommendations for Other Services       Precautions / Restrictions Precautions Precautions: Fall Restrictions Weight Bearing Restrictions: No Other Position/Activity Restrictions: multiple amputations of LE and UE.      Mobility  Bed Mobility               General bed mobility comments: Sitting EOB upon PT arrival.    Transfers Overall transfer level: Needs assistance Equipment used: None Transfers: Bed to chair/wheelchair/BSC            Lateral/Scoot Transfers: Min guard General transfer comment: Min guard to transfer from bed to/from w/c with assist for setup and locking brakes. Increased time due to fatigue. Worsened pain with mobility.    Ambulation/Gait               General Gait Details: Lexicographer mobility: Yes Wheelchair propulsion: Left lower extremity Wheelchair parts: Needs assistance Distance: 400 Wheelchair Assistance Details (indicate cue type and reason): pt able to maneuver w/c ~10' in room, does not like to use BUEs to push due to finger amputations/numbness; pt required assistance to propel w/c in hallway  Modified Rankin (Stroke Patients Only)       Balance Overall balance assessment: Needs assistance Sitting-balance support: No upper extremity supported (foot supported) Sitting balance-Leahy Scale: Good Sitting balance - Comments: Able to lean onto right/left side without difficulty and come back to neutral.                                    Cognition Arousal/Alertness: Awake/alert Behavior During Therapy: WFL for tasks assessed/performed Overall Cognitive Status: Within Functional Limits for tasks assessed  General Comments: aware of deficits        Exercises Other Exercises Other Exercises: Worked on mobilizing w/c forwards/backwards 20' for hamstring/quads strengthening    General Comments General comments (skin integrity, edema, etc.): HR irregular throughout session.      Pertinent Vitals/Pain Pain Assessment Pain Assessment: Faces Faces Pain Scale: Hurts even more Pain Location: Rt residual  limb and Bil hands Pain Descriptors / Indicators: Sore, Discomfort Pain Intervention(s): Monitored during session, Repositioned, Limited activity within patient's tolerance, Premedicated before session    Home Living                          Prior Function            PT Goals (current goals can now be found in the care plan section) Progress towards PT goals: Progressing toward goals    Frequency    Min 3X/week      PT Plan Current plan remains appropriate    Co-evaluation              AM-PAC PT "6 Clicks" Mobility   Outcome Measure  Help needed turning from your back to your side while in a flat bed without using bedrails?: A Little Help needed moving from lying on your back to sitting on the side of a flat bed without using bedrails?: A Little Help needed moving to and from a bed to a chair (including a wheelchair)?: A Little Help needed standing up from a chair using your arms (e.g., wheelchair or bedside chair)?: Total Help needed to walk in hospital room?: Total Help needed climbing 3-5 steps with a railing? : Total 6 Click Score: 12    End of Session Equipment Utilized During Treatment: Gait belt Activity Tolerance: Patient tolerated treatment well;Patient limited by pain;Patient limited by fatigue Patient left: in bed;with call bell/phone within reach Nurse Communication: Mobility status PT Visit Diagnosis: Muscle weakness (generalized) (M62.81);Unsteadiness on feet (R26.81);Difficulty in walking, not elsewhere classified (R26.2);Pain Pain - Right/Left: Right Pain - part of body: Leg     Time: 0850-0920 PT Time Calculation (min) (ACUTE ONLY): 30 min  Charges:  $Therapeutic Exercise: 8-22 mins $Therapeutic Activity: 8-22 mins                     Marisa Severin, PT, DPT Acute Rehabilitation Services Secure chat preferred Office [place] 01/16/2022, 9:59 AM

## 2022-01-16 NOTE — Progress Notes (Signed)
Subjective: Sitting at bedside eating lunch no complaints said tolerated dialysis yesterday /denies chest pain or shortness of breath.  Noted for discharge today  Objective Vital signs in last 24 hours: Vitals:   01/15/22 2001 01/16/22 0020 01/16/22 0725 01/16/22 1218  BP: 138/68 125/68 126/66 (!) 145/70  Pulse: 86 84 68 68  Resp: 18 11 (!) 60 15  Temp: 97.8 F (36.6 C) 98.1 F (36.7 C) 98.4 F (36.9 C) 98.3 F (36.8 C)  TempSrc: Oral Oral Oral Oral  SpO2: 98% 94% 99% 100%  Weight:      Height:       Weight change:   Physical Exam: General: Alert, male pleasant chronically ill-appearing ,NAD Heart: RRR  no MRG Lungs: CTA B, nonlabored breathing on room air Abdomen: Obese, NABS soft NT ND Extremities: Right AKA trace edema, left TMA dressing dry clean, left hand dressing dry and clean Dialysis Access: Positive bruit left arm AV fistula   Out Patient dialysis Orders: TTS [facility]  4.5h  425/700  94kg  2/2 bath  LUA AVF   Heparin 8000+ [id number] prn - venofer 100 mg tiw IV , 5/04- 5/25 - mircera 30 ug q4 wks, last 5/4, due early June - hectorol 11 ug tiw IV - last HD 5/20, 93.3 > 92.0 kg - Hb 9.9 on 5/18       Problem/Plan: R BKA stump pain/ severe PAD/Possible cellulits + necrotic fingers- s/p AKA on 01/08/22 by Dr. CDonzetta Matters BC NGTD. Remains on vanc and meropenam.  And appears stable noted ,had "episode" on 5/24-  confusion/hypotension/fever-  now resolved-   ESRD - on HD TTS.  HD tolerated yesterday via via AVF.  Hyperkalemia - had resolved.--Last K of K4.4  BP/ vol - BP's soft to normal.  This a.m. BP stable, initially weights over EDW ,now under/ Using bed weights/ UF as tolerated.  Will need new lower EDW  now post AKA.--     no BP meds.,  New EDW 90.5 Severe PAD - sp L TMA, multiple amputations of L hand-status post 5/27 =left index finger amputation and right index, long ,and small digit amputation bilaterally by Dr. KFredna Dowfollow-up in office later this week//VVS  involved-  said if continues to have issues may need ligation of AVF.  CAD/ ICM / combined CHF - EF 40-45%  Mobitz type I second-degree heart block (admit team team noted) on telemetry and EKG 5/29= asymptomatic, no rate controlling agents asymptomatic, seen 5/30 by  cardiology evaluation= placement indicated, avoid AV nodal agents.  Further eval needed Anemia esrd - Hb drop to 7.4 post surgery-5/28 up to 8.5 . Hold iron d/t potential infection.  on ESA -Aranesp 100 mcg.  Given 5/26, transfuse prn for Hgb<7-  MBD ckd -corrected Ca 11.1, phos last 4.3. Cont velphoro as binder and hold IV vdra with elevated calcium Nutrition - renal diet w/fluid restrictions. Alb 2.5 - give protein supplements DM2 - per pmd  DErnest Haber PA-C [facility] [id number]/31/2023,12:58 PM  LOS: 9 days   Labs: Basic Metabolic Panel: Recent Labs  Lab 01/11/22 1755 01/12/22 0041 01/13/22 0055 01/14/22 0029  NA 135 132* 133* 133*  K 3.9 3.8 5.6* 4.4  CL 95* 95* 96* 94*  CO2 '25 24 26 26  '$ GLUCOSE 135* 153* 286* 297*  BUN 23* 28* 25* 45*  CREATININE 5.19* 5.81* 4.65* 6.62*  CALCIUM 9.1 8.6* 8.7* 9.9  PHOS 3.2  --   --  4.3   Liver Function  Tests: Recent Labs  Lab 01/11/22 0239 01/11/22 1755 01/12/22 0041 01/13/22 0055 01/14/22 0029  AST 34  --  24 31  --   ALT 12  --  8 12  --   ALKPHOS 108  --  112 108  --   BILITOT 0.7  --  0.8 1.3*  --   PROT 6.8  --  6.3* 6.5  --   ALBUMIN 2.5*   < > 2.4* 2.4* 2.5*   < > = values in this interval not displayed.   No results for input(s): LIPASE, AMYLASE in the last 168 hours. No results for input(s): AMMONIA in the last 168 hours. CBC: Recent Labs  Lab 01/10/22 1201 01/11/22 0239 01/11/22 1755 01/12/22 0041 01/13/22 0055  WBC 11.8* 9.9 9.0 8.1 10.4  NEUTROABS  --  8.0*  --  6.3 9.6*  HGB 7.6* 7.9* 8.7* 7.8* 8.5*  HCT 22.6* 22.4* 26.2* 24.7* 26.2*  MCV 97.4 95.3 97.8 98.4 98.5  PLT 214 241 281 263 297   Cardiac Enzymes: No  results for input(s): CKTOTAL, CKMB, CKMBINDEX, TROPONINI in the last 168 hours. CBG: Recent Labs  Lab 01/15/22 1207 01/15/22 1723 01/15/22 2127 01/16/22 0644 01/16/22 1122  GLUCAP 104* 148* 127* 158* 71    Studies/Results: No results found. Medications:  sodium chloride     sodium chloride     sodium chloride 250 mL (01/12/22 2356)    aspirin EC  81 mg Oral Daily   atorvastatin  80 mg Oral Daily   Chlorhexidine Gluconate Cloth  6 each Topical Q0600   clopidogrel  75 mg Oral Daily   darbepoetin (ARANESP) injection - DIALYSIS  100 mcg Intravenous Q Thu-HD   ezetimibe  10 mg Oral Daily   feeding supplement (NEPRO CARB STEADY)  237 mL Oral BID BM   gabapentin  100 mg Oral BID   heparin  5,000 Units Subcutaneous Q8H   insulin aspart  0-9 Units Subcutaneous TID WC   insulin aspart protamine- aspart  15 Units Subcutaneous BID WC   linaclotide  72 mcg Oral QAC breakfast   pantoprazole  40 mg Oral BID   polyethylene glycol  17 g Oral Daily   sodium zirconium cyclosilicate  10 g Oral Daily   sucroferric oxyhydroxide  1,000 mg Oral TID WC

## 2022-01-17 DIAGNOSIS — Z992 Dependence on renal dialysis: Secondary | ICD-10-CM | POA: Diagnosis not present

## 2022-01-17 DIAGNOSIS — D631 Anemia in chronic kidney disease: Secondary | ICD-10-CM | POA: Diagnosis not present

## 2022-01-17 DIAGNOSIS — R52 Pain, unspecified: Secondary | ICD-10-CM | POA: Diagnosis not present

## 2022-01-17 DIAGNOSIS — N186 End stage renal disease: Secondary | ICD-10-CM | POA: Diagnosis not present

## 2022-01-17 DIAGNOSIS — D509 Iron deficiency anemia, unspecified: Secondary | ICD-10-CM | POA: Diagnosis not present

## 2022-01-17 DIAGNOSIS — D689 Coagulation defect, unspecified: Secondary | ICD-10-CM | POA: Diagnosis not present

## 2022-01-17 DIAGNOSIS — N2581 Secondary hyperparathyroidism of renal origin: Secondary | ICD-10-CM | POA: Diagnosis not present

## 2022-01-17 DIAGNOSIS — E1129 Type 2 diabetes mellitus with other diabetic kidney complication: Secondary | ICD-10-CM | POA: Diagnosis not present

## 2022-01-19 DIAGNOSIS — N186 End stage renal disease: Secondary | ICD-10-CM | POA: Diagnosis not present

## 2022-01-19 DIAGNOSIS — D689 Coagulation defect, unspecified: Secondary | ICD-10-CM | POA: Diagnosis not present

## 2022-01-19 DIAGNOSIS — E1129 Type 2 diabetes mellitus with other diabetic kidney complication: Secondary | ICD-10-CM | POA: Diagnosis not present

## 2022-01-19 DIAGNOSIS — Z992 Dependence on renal dialysis: Secondary | ICD-10-CM | POA: Diagnosis not present

## 2022-01-19 DIAGNOSIS — D509 Iron deficiency anemia, unspecified: Secondary | ICD-10-CM | POA: Diagnosis not present

## 2022-01-19 DIAGNOSIS — R52 Pain, unspecified: Secondary | ICD-10-CM | POA: Diagnosis not present

## 2022-01-19 DIAGNOSIS — N2581 Secondary hyperparathyroidism of renal origin: Secondary | ICD-10-CM | POA: Diagnosis not present

## 2022-01-19 DIAGNOSIS — D631 Anemia in chronic kidney disease: Secondary | ICD-10-CM | POA: Diagnosis not present

## 2022-01-20 DIAGNOSIS — M1990 Unspecified osteoarthritis, unspecified site: Secondary | ICD-10-CM | POA: Diagnosis not present

## 2022-01-20 DIAGNOSIS — Z7982 Long term (current) use of aspirin: Secondary | ICD-10-CM | POA: Diagnosis not present

## 2022-01-20 DIAGNOSIS — Z9181 History of falling: Secondary | ICD-10-CM | POA: Diagnosis not present

## 2022-01-20 DIAGNOSIS — I5042 Chronic combined systolic (congestive) and diastolic (congestive) heart failure: Secondary | ICD-10-CM | POA: Diagnosis not present

## 2022-01-20 DIAGNOSIS — F32A Depression, unspecified: Secondary | ICD-10-CM | POA: Diagnosis not present

## 2022-01-20 DIAGNOSIS — E1122 Type 2 diabetes mellitus with diabetic chronic kidney disease: Secondary | ICD-10-CM | POA: Diagnosis not present

## 2022-01-20 DIAGNOSIS — G4730 Sleep apnea, unspecified: Secondary | ICD-10-CM | POA: Diagnosis not present

## 2022-01-20 DIAGNOSIS — K219 Gastro-esophageal reflux disease without esophagitis: Secondary | ICD-10-CM | POA: Diagnosis not present

## 2022-01-20 DIAGNOSIS — Z4781 Encounter for orthopedic aftercare following surgical amputation: Secondary | ICD-10-CM | POA: Diagnosis not present

## 2022-01-20 DIAGNOSIS — L97821 Non-pressure chronic ulcer of other part of left lower leg limited to breakdown of skin: Secondary | ICD-10-CM | POA: Diagnosis not present

## 2022-01-20 DIAGNOSIS — Z87891 Personal history of nicotine dependence: Secondary | ICD-10-CM | POA: Diagnosis not present

## 2022-01-20 DIAGNOSIS — Z7902 Long term (current) use of antithrombotics/antiplatelets: Secondary | ICD-10-CM | POA: Diagnosis not present

## 2022-01-20 DIAGNOSIS — I132 Hypertensive heart and chronic kidney disease with heart failure and with stage 5 chronic kidney disease, or end stage renal disease: Secondary | ICD-10-CM | POA: Diagnosis not present

## 2022-01-20 DIAGNOSIS — Z89611 Acquired absence of right leg above knee: Secondary | ICD-10-CM | POA: Diagnosis not present

## 2022-01-20 DIAGNOSIS — E785 Hyperlipidemia, unspecified: Secondary | ICD-10-CM | POA: Diagnosis not present

## 2022-01-20 DIAGNOSIS — I2510 Atherosclerotic heart disease of native coronary artery without angina pectoris: Secondary | ICD-10-CM | POA: Diagnosis not present

## 2022-01-20 DIAGNOSIS — E1151 Type 2 diabetes mellitus with diabetic peripheral angiopathy without gangrene: Secondary | ICD-10-CM | POA: Diagnosis not present

## 2022-01-20 DIAGNOSIS — D631 Anemia in chronic kidney disease: Secondary | ICD-10-CM | POA: Diagnosis not present

## 2022-01-20 DIAGNOSIS — I252 Old myocardial infarction: Secondary | ICD-10-CM | POA: Diagnosis not present

## 2022-01-20 DIAGNOSIS — Z89022 Acquired absence of left finger(s): Secondary | ICD-10-CM | POA: Diagnosis not present

## 2022-01-20 DIAGNOSIS — N186 End stage renal disease: Secondary | ICD-10-CM | POA: Diagnosis not present

## 2022-01-22 DIAGNOSIS — N2581 Secondary hyperparathyroidism of renal origin: Secondary | ICD-10-CM | POA: Diagnosis not present

## 2022-01-22 DIAGNOSIS — E1129 Type 2 diabetes mellitus with other diabetic kidney complication: Secondary | ICD-10-CM | POA: Diagnosis not present

## 2022-01-22 DIAGNOSIS — Z992 Dependence on renal dialysis: Secondary | ICD-10-CM | POA: Diagnosis not present

## 2022-01-22 DIAGNOSIS — D689 Coagulation defect, unspecified: Secondary | ICD-10-CM | POA: Diagnosis not present

## 2022-01-22 DIAGNOSIS — R52 Pain, unspecified: Secondary | ICD-10-CM | POA: Diagnosis not present

## 2022-01-22 DIAGNOSIS — N186 End stage renal disease: Secondary | ICD-10-CM | POA: Diagnosis not present

## 2022-01-22 DIAGNOSIS — D509 Iron deficiency anemia, unspecified: Secondary | ICD-10-CM | POA: Diagnosis not present

## 2022-01-22 DIAGNOSIS — D631 Anemia in chronic kidney disease: Secondary | ICD-10-CM | POA: Diagnosis not present

## 2022-01-23 DIAGNOSIS — Z89022 Acquired absence of left finger(s): Secondary | ICD-10-CM | POA: Diagnosis not present

## 2022-01-23 DIAGNOSIS — M1990 Unspecified osteoarthritis, unspecified site: Secondary | ICD-10-CM | POA: Diagnosis not present

## 2022-01-23 DIAGNOSIS — D631 Anemia in chronic kidney disease: Secondary | ICD-10-CM | POA: Diagnosis not present

## 2022-01-23 DIAGNOSIS — I132 Hypertensive heart and chronic kidney disease with heart failure and with stage 5 chronic kidney disease, or end stage renal disease: Secondary | ICD-10-CM | POA: Diagnosis not present

## 2022-01-23 DIAGNOSIS — E1122 Type 2 diabetes mellitus with diabetic chronic kidney disease: Secondary | ICD-10-CM | POA: Diagnosis not present

## 2022-01-23 DIAGNOSIS — L97821 Non-pressure chronic ulcer of other part of left lower leg limited to breakdown of skin: Secondary | ICD-10-CM | POA: Diagnosis not present

## 2022-01-23 DIAGNOSIS — F32A Depression, unspecified: Secondary | ICD-10-CM | POA: Diagnosis not present

## 2022-01-23 DIAGNOSIS — I252 Old myocardial infarction: Secondary | ICD-10-CM | POA: Diagnosis not present

## 2022-01-23 DIAGNOSIS — G4730 Sleep apnea, unspecified: Secondary | ICD-10-CM | POA: Diagnosis not present

## 2022-01-23 DIAGNOSIS — K219 Gastro-esophageal reflux disease without esophagitis: Secondary | ICD-10-CM | POA: Diagnosis not present

## 2022-01-23 DIAGNOSIS — E785 Hyperlipidemia, unspecified: Secondary | ICD-10-CM | POA: Diagnosis not present

## 2022-01-23 DIAGNOSIS — Z9181 History of falling: Secondary | ICD-10-CM | POA: Diagnosis not present

## 2022-01-23 DIAGNOSIS — Z4781 Encounter for orthopedic aftercare following surgical amputation: Secondary | ICD-10-CM | POA: Diagnosis not present

## 2022-01-23 DIAGNOSIS — I2510 Atherosclerotic heart disease of native coronary artery without angina pectoris: Secondary | ICD-10-CM | POA: Diagnosis not present

## 2022-01-23 DIAGNOSIS — Z89611 Acquired absence of right leg above knee: Secondary | ICD-10-CM | POA: Diagnosis not present

## 2022-01-23 DIAGNOSIS — N186 End stage renal disease: Secondary | ICD-10-CM | POA: Diagnosis not present

## 2022-01-23 DIAGNOSIS — Z7902 Long term (current) use of antithrombotics/antiplatelets: Secondary | ICD-10-CM | POA: Diagnosis not present

## 2022-01-23 DIAGNOSIS — Z7982 Long term (current) use of aspirin: Secondary | ICD-10-CM | POA: Diagnosis not present

## 2022-01-23 DIAGNOSIS — I5042 Chronic combined systolic (congestive) and diastolic (congestive) heart failure: Secondary | ICD-10-CM | POA: Diagnosis not present

## 2022-01-23 DIAGNOSIS — E1151 Type 2 diabetes mellitus with diabetic peripheral angiopathy without gangrene: Secondary | ICD-10-CM | POA: Diagnosis not present

## 2022-01-23 DIAGNOSIS — Z87891 Personal history of nicotine dependence: Secondary | ICD-10-CM | POA: Diagnosis not present

## 2022-01-24 DIAGNOSIS — N2581 Secondary hyperparathyroidism of renal origin: Secondary | ICD-10-CM | POA: Diagnosis not present

## 2022-01-24 DIAGNOSIS — D631 Anemia in chronic kidney disease: Secondary | ICD-10-CM | POA: Diagnosis not present

## 2022-01-24 DIAGNOSIS — R52 Pain, unspecified: Secondary | ICD-10-CM | POA: Diagnosis not present

## 2022-01-24 DIAGNOSIS — Z992 Dependence on renal dialysis: Secondary | ICD-10-CM | POA: Diagnosis not present

## 2022-01-24 DIAGNOSIS — N186 End stage renal disease: Secondary | ICD-10-CM | POA: Diagnosis not present

## 2022-01-24 DIAGNOSIS — E1129 Type 2 diabetes mellitus with other diabetic kidney complication: Secondary | ICD-10-CM | POA: Diagnosis not present

## 2022-01-24 DIAGNOSIS — D689 Coagulation defect, unspecified: Secondary | ICD-10-CM | POA: Diagnosis not present

## 2022-01-24 DIAGNOSIS — D509 Iron deficiency anemia, unspecified: Secondary | ICD-10-CM | POA: Diagnosis not present

## 2022-01-25 DIAGNOSIS — I5042 Chronic combined systolic (congestive) and diastolic (congestive) heart failure: Secondary | ICD-10-CM | POA: Diagnosis not present

## 2022-01-25 DIAGNOSIS — G4730 Sleep apnea, unspecified: Secondary | ICD-10-CM | POA: Diagnosis not present

## 2022-01-25 DIAGNOSIS — I252 Old myocardial infarction: Secondary | ICD-10-CM | POA: Diagnosis not present

## 2022-01-25 DIAGNOSIS — E1151 Type 2 diabetes mellitus with diabetic peripheral angiopathy without gangrene: Secondary | ICD-10-CM | POA: Diagnosis not present

## 2022-01-25 DIAGNOSIS — Z7982 Long term (current) use of aspirin: Secondary | ICD-10-CM | POA: Diagnosis not present

## 2022-01-25 DIAGNOSIS — L97821 Non-pressure chronic ulcer of other part of left lower leg limited to breakdown of skin: Secondary | ICD-10-CM | POA: Diagnosis not present

## 2022-01-25 DIAGNOSIS — K219 Gastro-esophageal reflux disease without esophagitis: Secondary | ICD-10-CM | POA: Diagnosis not present

## 2022-01-25 DIAGNOSIS — D631 Anemia in chronic kidney disease: Secondary | ICD-10-CM | POA: Diagnosis not present

## 2022-01-25 DIAGNOSIS — Z89611 Acquired absence of right leg above knee: Secondary | ICD-10-CM | POA: Diagnosis not present

## 2022-01-25 DIAGNOSIS — M1990 Unspecified osteoarthritis, unspecified site: Secondary | ICD-10-CM | POA: Diagnosis not present

## 2022-01-25 DIAGNOSIS — Z4781 Encounter for orthopedic aftercare following surgical amputation: Secondary | ICD-10-CM | POA: Diagnosis not present

## 2022-01-25 DIAGNOSIS — F32A Depression, unspecified: Secondary | ICD-10-CM | POA: Diagnosis not present

## 2022-01-25 DIAGNOSIS — E785 Hyperlipidemia, unspecified: Secondary | ICD-10-CM | POA: Diagnosis not present

## 2022-01-25 DIAGNOSIS — N186 End stage renal disease: Secondary | ICD-10-CM | POA: Diagnosis not present

## 2022-01-25 DIAGNOSIS — Z9181 History of falling: Secondary | ICD-10-CM | POA: Diagnosis not present

## 2022-01-25 DIAGNOSIS — E1122 Type 2 diabetes mellitus with diabetic chronic kidney disease: Secondary | ICD-10-CM | POA: Diagnosis not present

## 2022-01-25 DIAGNOSIS — I132 Hypertensive heart and chronic kidney disease with heart failure and with stage 5 chronic kidney disease, or end stage renal disease: Secondary | ICD-10-CM | POA: Diagnosis not present

## 2022-01-25 DIAGNOSIS — Z87891 Personal history of nicotine dependence: Secondary | ICD-10-CM | POA: Diagnosis not present

## 2022-01-25 DIAGNOSIS — Z89022 Acquired absence of left finger(s): Secondary | ICD-10-CM | POA: Diagnosis not present

## 2022-01-25 DIAGNOSIS — I2510 Atherosclerotic heart disease of native coronary artery without angina pectoris: Secondary | ICD-10-CM | POA: Diagnosis not present

## 2022-01-25 DIAGNOSIS — Z7902 Long term (current) use of antithrombotics/antiplatelets: Secondary | ICD-10-CM | POA: Diagnosis not present

## 2022-01-26 DIAGNOSIS — E1129 Type 2 diabetes mellitus with other diabetic kidney complication: Secondary | ICD-10-CM | POA: Diagnosis not present

## 2022-01-26 DIAGNOSIS — Z992 Dependence on renal dialysis: Secondary | ICD-10-CM | POA: Diagnosis not present

## 2022-01-26 DIAGNOSIS — D689 Coagulation defect, unspecified: Secondary | ICD-10-CM | POA: Diagnosis not present

## 2022-01-26 DIAGNOSIS — D631 Anemia in chronic kidney disease: Secondary | ICD-10-CM | POA: Diagnosis not present

## 2022-01-26 DIAGNOSIS — R52 Pain, unspecified: Secondary | ICD-10-CM | POA: Diagnosis not present

## 2022-01-26 DIAGNOSIS — N2581 Secondary hyperparathyroidism of renal origin: Secondary | ICD-10-CM | POA: Diagnosis not present

## 2022-01-26 DIAGNOSIS — D509 Iron deficiency anemia, unspecified: Secondary | ICD-10-CM | POA: Diagnosis not present

## 2022-01-26 DIAGNOSIS — N186 End stage renal disease: Secondary | ICD-10-CM | POA: Diagnosis not present

## 2022-01-28 ENCOUNTER — Encounter (HOSPITAL_COMMUNITY): Payer: Self-pay

## 2022-01-28 ENCOUNTER — Other Ambulatory Visit: Payer: Self-pay

## 2022-01-28 ENCOUNTER — Ambulatory Visit: Payer: Medicare Other | Admitting: Physician Assistant

## 2022-01-28 ENCOUNTER — Emergency Department (HOSPITAL_COMMUNITY)
Admission: EM | Admit: 2022-01-28 | Discharge: 2022-01-29 | Disposition: A | Discharge status: Home / Self Care | Payer: Medicare Other | Attending: Emergency Medicine | Admitting: Emergency Medicine

## 2022-01-28 DIAGNOSIS — I132 Hypertensive heart and chronic kidney disease with heart failure and with stage 5 chronic kidney disease, or end stage renal disease: Secondary | ICD-10-CM | POA: Diagnosis not present

## 2022-01-28 DIAGNOSIS — I10 Essential (primary) hypertension: Secondary | ICD-10-CM | POA: Diagnosis not present

## 2022-01-28 DIAGNOSIS — Z955 Presence of coronary angioplasty implant and graft: Secondary | ICD-10-CM | POA: Diagnosis not present

## 2022-01-28 DIAGNOSIS — Z992 Dependence on renal dialysis: Secondary | ICD-10-CM | POA: Diagnosis not present

## 2022-01-28 DIAGNOSIS — Z96642 Presence of left artificial hip joint: Secondary | ICD-10-CM | POA: Diagnosis not present

## 2022-01-28 DIAGNOSIS — I5041 Acute combined systolic (congestive) and diastolic (congestive) heart failure: Secondary | ICD-10-CM | POA: Insufficient documentation

## 2022-01-28 DIAGNOSIS — M1990 Unspecified osteoarthritis, unspecified site: Secondary | ICD-10-CM | POA: Diagnosis not present

## 2022-01-28 DIAGNOSIS — Z7902 Long term (current) use of antithrombotics/antiplatelets: Secondary | ICD-10-CM | POA: Insufficient documentation

## 2022-01-28 DIAGNOSIS — Z89611 Acquired absence of right leg above knee: Secondary | ICD-10-CM | POA: Diagnosis not present

## 2022-01-28 DIAGNOSIS — Z87891 Personal history of nicotine dependence: Secondary | ICD-10-CM | POA: Diagnosis not present

## 2022-01-28 DIAGNOSIS — E1122 Type 2 diabetes mellitus with diabetic chronic kidney disease: Secondary | ICD-10-CM | POA: Insufficient documentation

## 2022-01-28 DIAGNOSIS — I96 Gangrene, not elsewhere classified: Secondary | ICD-10-CM | POA: Diagnosis not present

## 2022-01-28 DIAGNOSIS — E1140 Type 2 diabetes mellitus with diabetic neuropathy, unspecified: Secondary | ICD-10-CM | POA: Insufficient documentation

## 2022-01-28 DIAGNOSIS — Z79899 Other long term (current) drug therapy: Secondary | ICD-10-CM | POA: Insufficient documentation

## 2022-01-28 DIAGNOSIS — I2510 Atherosclerotic heart disease of native coronary artery without angina pectoris: Secondary | ICD-10-CM | POA: Diagnosis not present

## 2022-01-28 DIAGNOSIS — Z7982 Long term (current) use of aspirin: Secondary | ICD-10-CM | POA: Insufficient documentation

## 2022-01-28 DIAGNOSIS — E785 Hyperlipidemia, unspecified: Secondary | ICD-10-CM | POA: Diagnosis not present

## 2022-01-28 DIAGNOSIS — K219 Gastro-esophageal reflux disease without esophagitis: Secondary | ICD-10-CM | POA: Diagnosis not present

## 2022-01-28 DIAGNOSIS — Z794 Long term (current) use of insulin: Secondary | ICD-10-CM | POA: Insufficient documentation

## 2022-01-28 DIAGNOSIS — Z89022 Acquired absence of left finger(s): Secondary | ICD-10-CM | POA: Diagnosis not present

## 2022-01-28 DIAGNOSIS — Z4781 Encounter for orthopedic aftercare following surgical amputation: Secondary | ICD-10-CM | POA: Diagnosis not present

## 2022-01-28 DIAGNOSIS — N186 End stage renal disease: Secondary | ICD-10-CM | POA: Diagnosis not present

## 2022-01-28 DIAGNOSIS — T8753 Necrosis of amputation stump, right lower extremity: Secondary | ICD-10-CM | POA: Diagnosis not present

## 2022-01-28 DIAGNOSIS — Y828 Other medical devices associated with adverse incidents: Secondary | ICD-10-CM | POA: Diagnosis not present

## 2022-01-28 DIAGNOSIS — F17210 Nicotine dependence, cigarettes, uncomplicated: Secondary | ICD-10-CM | POA: Diagnosis not present

## 2022-01-28 DIAGNOSIS — E1151 Type 2 diabetes mellitus with diabetic peripheral angiopathy without gangrene: Secondary | ICD-10-CM | POA: Diagnosis not present

## 2022-01-28 DIAGNOSIS — M7989 Other specified soft tissue disorders: Secondary | ICD-10-CM | POA: Diagnosis present

## 2022-01-28 DIAGNOSIS — F32A Depression, unspecified: Secondary | ICD-10-CM | POA: Diagnosis not present

## 2022-01-28 DIAGNOSIS — L97821 Non-pressure chronic ulcer of other part of left lower leg limited to breakdown of skin: Secondary | ICD-10-CM | POA: Diagnosis not present

## 2022-01-28 DIAGNOSIS — T8131XA Disruption of external operation (surgical) wound, not elsewhere classified, initial encounter: Secondary | ICD-10-CM | POA: Diagnosis not present

## 2022-01-28 DIAGNOSIS — I739 Peripheral vascular disease, unspecified: Secondary | ICD-10-CM | POA: Diagnosis not present

## 2022-01-28 DIAGNOSIS — I252 Old myocardial infarction: Secondary | ICD-10-CM | POA: Diagnosis not present

## 2022-01-28 DIAGNOSIS — I5042 Chronic combined systolic (congestive) and diastolic (congestive) heart failure: Secondary | ICD-10-CM | POA: Diagnosis not present

## 2022-01-28 DIAGNOSIS — T8130XA Disruption of wound, unspecified, initial encounter: Secondary | ICD-10-CM

## 2022-01-28 DIAGNOSIS — G4730 Sleep apnea, unspecified: Secondary | ICD-10-CM | POA: Diagnosis not present

## 2022-01-28 DIAGNOSIS — Z9181 History of falling: Secondary | ICD-10-CM | POA: Diagnosis not present

## 2022-01-28 DIAGNOSIS — D631 Anemia in chronic kidney disease: Secondary | ICD-10-CM | POA: Diagnosis not present

## 2022-01-28 NOTE — ED Provider Triage Note (Signed)
Emergency Medicine Provider Triage Evaluation Note  Edward [name] , a 51 y.o. male  was evaluated in triage.  Pt complains of postoperative problem.  Recently had a BKA of the right extremity.  Not on anticoagulation.  Denies recent fevers or lightheadedness.  Review of Systems  Positive:  Negative: As above  Physical Exam  BP 135/76 (BP Location: Right Arm)   Pulse 86   Temp 98.5 F (36.9 C) (Oral)   Resp 16   SpO2 100%  Gen:   Awake, no distress   Resp:  Normal effort  MSK:   Moves extremities without difficulty  Other:  Mild swelling with blood visible on bandages around the area.  Sensation in the area appears neurovascular intact.  No obvious signs of infection.  Medical Decision Making  Medically screening exam initiated at 8:24 PM.  Appropriate orders placed.  Edward [name] was informed that the remainder of the evaluation will be completed by another provider, this initial triage assessment does not replace that evaluation, and the importance of remaining in the ED until their evaluation is complete.     Prince Rome, PA-C 75/88/32 2045

## 2022-01-28 NOTE — ED Triage Notes (Signed)
Pt reports he was getting ready for bed tonight when he notice his wheelchair was wet with blood. Pt reports he recently had left leg amputation. Pt had staples placed but he thinks a couple came out. Pt has dressing on wound with minimal bleeding at this time.

## 2022-01-29 DIAGNOSIS — Z992 Dependence on renal dialysis: Secondary | ICD-10-CM | POA: Diagnosis not present

## 2022-01-29 DIAGNOSIS — D631 Anemia in chronic kidney disease: Secondary | ICD-10-CM | POA: Diagnosis not present

## 2022-01-29 DIAGNOSIS — Z7982 Long term (current) use of aspirin: Secondary | ICD-10-CM | POA: Diagnosis not present

## 2022-01-29 DIAGNOSIS — E785 Hyperlipidemia, unspecified: Secondary | ICD-10-CM | POA: Diagnosis not present

## 2022-01-29 DIAGNOSIS — I132 Hypertensive heart and chronic kidney disease with heart failure and with stage 5 chronic kidney disease, or end stage renal disease: Secondary | ICD-10-CM | POA: Diagnosis not present

## 2022-01-29 DIAGNOSIS — K219 Gastro-esophageal reflux disease without esophagitis: Secondary | ICD-10-CM | POA: Diagnosis not present

## 2022-01-29 DIAGNOSIS — Z89611 Acquired absence of right leg above knee: Secondary | ICD-10-CM | POA: Diagnosis not present

## 2022-01-29 DIAGNOSIS — Z7902 Long term (current) use of antithrombotics/antiplatelets: Secondary | ICD-10-CM | POA: Diagnosis not present

## 2022-01-29 DIAGNOSIS — F32A Depression, unspecified: Secondary | ICD-10-CM | POA: Diagnosis not present

## 2022-01-29 DIAGNOSIS — I252 Old myocardial infarction: Secondary | ICD-10-CM | POA: Diagnosis not present

## 2022-01-29 DIAGNOSIS — I5042 Chronic combined systolic (congestive) and diastolic (congestive) heart failure: Secondary | ICD-10-CM | POA: Diagnosis not present

## 2022-01-29 DIAGNOSIS — N186 End stage renal disease: Secondary | ICD-10-CM | POA: Diagnosis not present

## 2022-01-29 DIAGNOSIS — R52 Pain, unspecified: Secondary | ICD-10-CM | POA: Diagnosis not present

## 2022-01-29 DIAGNOSIS — Z89022 Acquired absence of left finger(s): Secondary | ICD-10-CM | POA: Diagnosis not present

## 2022-01-29 DIAGNOSIS — N2581 Secondary hyperparathyroidism of renal origin: Secondary | ICD-10-CM | POA: Diagnosis not present

## 2022-01-29 DIAGNOSIS — E1151 Type 2 diabetes mellitus with diabetic peripheral angiopathy without gangrene: Secondary | ICD-10-CM | POA: Diagnosis not present

## 2022-01-29 DIAGNOSIS — I2510 Atherosclerotic heart disease of native coronary artery without angina pectoris: Secondary | ICD-10-CM | POA: Diagnosis not present

## 2022-01-29 DIAGNOSIS — D509 Iron deficiency anemia, unspecified: Secondary | ICD-10-CM | POA: Diagnosis not present

## 2022-01-29 DIAGNOSIS — Z87891 Personal history of nicotine dependence: Secondary | ICD-10-CM | POA: Diagnosis not present

## 2022-01-29 DIAGNOSIS — Z4781 Encounter for orthopedic aftercare following surgical amputation: Secondary | ICD-10-CM | POA: Diagnosis not present

## 2022-01-29 DIAGNOSIS — E1122 Type 2 diabetes mellitus with diabetic chronic kidney disease: Secondary | ICD-10-CM | POA: Diagnosis not present

## 2022-01-29 DIAGNOSIS — L97821 Non-pressure chronic ulcer of other part of left lower leg limited to breakdown of skin: Secondary | ICD-10-CM | POA: Diagnosis not present

## 2022-01-29 DIAGNOSIS — G4730 Sleep apnea, unspecified: Secondary | ICD-10-CM | POA: Diagnosis not present

## 2022-01-29 DIAGNOSIS — M1990 Unspecified osteoarthritis, unspecified site: Secondary | ICD-10-CM | POA: Diagnosis not present

## 2022-01-29 DIAGNOSIS — T8131XA Disruption of external operation (surgical) wound, not elsewhere classified, initial encounter: Secondary | ICD-10-CM | POA: Diagnosis not present

## 2022-01-29 DIAGNOSIS — E1129 Type 2 diabetes mellitus with other diabetic kidney complication: Secondary | ICD-10-CM | POA: Diagnosis not present

## 2022-01-29 DIAGNOSIS — Z9181 History of falling: Secondary | ICD-10-CM | POA: Diagnosis not present

## 2022-01-29 DIAGNOSIS — D689 Coagulation defect, unspecified: Secondary | ICD-10-CM | POA: Diagnosis not present

## 2022-01-29 MED ORDER — OXYCODONE-ACETAMINOPHEN 5-325 MG PO TABS
1.0000 | ORAL_TABLET | Freq: Once | ORAL | Status: AC
  Administered 2022-01-29: 1 via ORAL
  Filled 2022-01-29: qty 1

## 2022-01-29 NOTE — ED Notes (Signed)
Patient verbalizes understanding of d/c instructions. Opportunities for questions and answers were provided. Pt d/c from ED and wheeled to lobby where son is picking pt up.

## 2022-01-29 NOTE — ED Provider Notes (Signed)
[facility] EMERGENCY DEPARTMENT Provider Note  CSN: [id number] Arrival date & time: 01/28/22 1952  Chief Complaint(s) Post-op Problem  HPI Edward [name] is a 51 y.o. male with a past medical history listed below including hypertension, diabetes, peripheral vascular disease status post right AKA 3 weeks ago presents to the ED for bleeding from the amputation surgical site.  He reports that he has been doing well following the surgery and and going to physical therapy.  Today while transitioning from wheelchair, he noted bleeding coming from the wound.  This was redressed but the bleeding continued.  He denied any fall or trauma.  Reports that the swelling to the right stump has significantly decreased.  He denies any associated pain, redness or purulent discharge.  The history is provided by the patient.    Past Medical History Past Medical History:  Diagnosis Date   Allergy    Anemia    getting Venofer with dialysis   Anxiety    self reported, sometimes-situational anxiety   Arthritis    bilateral hands/LEFT hip   CHF (congestive heart failure) (HCC)    Chronic kidney disease    t th s dialysis- [place] ---ESRD   Complication of anesthesia    slow to go to sleep   Coronary artery disease    Depression    self reported, sometimes-situational depression   Diabetes mellitus    type II- on meds   Dyspnea    sometimes- fluid   GERD (gastroesophageal reflux disease)    on meds   Hypertension    no longer on medication since starting on Hemodialysis   Myocardial infarction ([place]) 2019   Sleep apnea    Patient Active Problem List   Diagnosis Date Noted   Severe sepsis ([place])    Acute metabolic encephalopathy    PAD (peripheral artery disease) ([place]) 01/07/2022   Osteomyelitis of finger of left hand ([place]) 10/04/2021   Junctional rhythm 10/04/2021   Acute cholecystitis 09/27/2020   Elevated LFTs 09/27/2020   TIA (transient ischemic attack) 09/27/2020   Nausea  and vomiting    ESRD on hemodialysis ([place]) 06/22/2020   S/P coronary artery stent placement 06/22/2020   Encounter for removal of sutures 03/07/2020   Fluid overload, unspecified 12/29/2019   Hypertensive chronic kidney disease with stage 1 through stage 4 chronic kidney disease, or unspecified chronic kidney disease 11/30/2019   Pruritus, unspecified 11/30/2019   Secondary hyperparathyroidism of renal origin ([place]) 11/30/2019   Unspecified atrial fibrillation ([place]) 11/30/2019   Pain, unspecified 11/30/2019   Other specified arthritis, unspecified site 11/30/2019   Iron deficiency anemia, unspecified 11/30/2019   End stage renal disease ([place]) 11/30/2019   Encounter for screening for respiratory tuberculosis 11/30/2019   Diarrhea, unspecified 11/30/2019   Coagulation defect, unspecified ([place]) 11/30/2019   Atherosclerotic heart disease of native coronary artery without angina pectoris 11/30/2019   Acute renal failure (ARF) ([place]) 11/22/2019   Transaminitis 12/22/2018   Acute exacerbation of CHF (congestive heart failure) ([place]) 12/21/2018   Gait abnormality 09/11/2018   CAD S/P percutaneous coronary angioplasty 08/06/2018   Cardiomyopathy ([place]) 08/06/2018   PVD (peripheral vascular disease) ([place]) 08/06/2018   Shortness of breath    Chronic combined systolic and diastolic CHF (congestive heart failure) ([place])    History of NSTEMI 07/28/2018   Respiratory failure with hypoxia ([place]) 07/28/2018   Acute heart failure ([place]) 07/28/2018   Hypertensive emergency 07/28/2018   S/P BKA (below knee amputation) unilateral, right ([place]) 07/15/2018  Hypoglycemia    Anxiety about health    Labile blood glucose    Essential hypertension    History of transmetatarsal amputation of left foot (HCC)    Hypoalbuminemia due to protein-calorie malnutrition ([place])    Poorly controlled type 2 diabetes mellitus with peripheral neuropathy ([place])    Acute blood loss anemia    Anemia of chronic disease     Leukocytosis    Tobacco abuse    Hyperkalemia 06/20/2018   Diabetic polyneuropathy associated with type 2 diabetes mellitus ([place])    Severe protein-calorie malnutrition ([place])    AKI (acute kidney injury) ([place]) 06/17/2018   Normocytic anemia 06/17/2018   Viral pharyngitis 01/14/2017   Cigarette smoker 01/14/2017   Avascular necrosis of hip, left ([place]) 10/11/2016   Status post left hip replacement 10/11/2016   S/P transmetatarsal amputation of foot, left ([place]) 07/25/2016   Balance problem 07/25/2016   Fournier's gangrene 08/16/2011   Acute respiratory failure ([place]) 08/16/2011   Septic shock(785.52) 08/16/2011   Diabetes mellitus with complication, with long-term current use of insulin ([place]) 08/16/2011   Home Medication(s) Prior to Admission medications   Medication Sig Start Date End Date Taking? Authorizing Provider  acetaminophen (TYLENOL) 500 MG tablet Take 500-1,000 mg by mouth every 6 (six) hours as needed for moderate pain or headache.    [provider]  aspirin EC 81 MG tablet Take 1 tablet (81 mg total) by mouth daily. Swallow whole. 03/14/20   Lelon Perla, MD  atorvastatin (LIPITOR) 80 MG tablet Take 80 mg by mouth daily.    [provider]  bismuth subsalicylate (PEPTO BISMOL) 262 MG/15ML suspension Take 30 mLs by mouth every 6 (six) hours as needed for indigestion.    [provider]  blood glucose meter kit and supplies Dispense based on patient and insurance preference. Use up to four times daily as directed. (FOR ICD-9 250.00, 250.01). 07/01/18   Angiulli, Lavon Paganini, PA-C  clopidogrel (PLAVIX) 75 MG tablet Take 1 tablet (75 mg total) by mouth daily. 04/09/21 04/09/22  Waynetta Sandy, MD  ezetimibe (ZETIA) 10 MG tablet Take 1 tablet (10 mg total) by mouth daily. 01/17/22   Danford, Suann Larry, MD  gabapentin (NEURONTIN) 100 MG capsule Take 100 mg by mouth 3 (three) times daily. 12/03/21   [provider]  insulin aspart  protamine - aspart (NOVOLOG MIX 70/30 FLEXPEN) (70-30) 100 UNIT/ML FlexPen Inject 0.3 mLs (30 Units total) into the skin 2 (two) times daily with a meal. 09/29/20   Lavina Hamman, MD  Insulin Pen Needle 31G X 5 MM MISC Use daily to inject insulin as instructed 07/01/18   Angiulli, Lavon Paganini, PA-C  lidocaine-prilocaine (EMLA) cream Apply 1 application topically See admin instructions. Apply a small amount to access site (AVF) 1-2 hours before dialysis. Cover with occlusive dressing (saran wrap). Tuesday,Thursday,saturday 03/22/21   [provider]  linaclotide (LINZESS) 72 MCG capsule Take 1 capsule (72 mcg total) by mouth daily before breakfast. Patient taking differently: Take 72 mcg by mouth daily as needed (constipation). 12/13/20   Armbruster, Carlota Raspberry, MD  pantoprazole (PROTONIX) 40 MG tablet Take 1 tablet (40 mg total) by mouth in the morning and at bedtime. Patient taking differently: Take 40 mg by mouth daily. 08/29/21   Willia Craze, NP  triamcinolone ointment (KENALOG) 0.1 % 1 application. daily as needed (rash). 09/04/21   [provider]  VELPHORO 500 MG chewable tablet Chew 1,000 mg by mouth 3 (three) times  daily with meals. 06/14/20   [provider]                                                                                                                                    Allergies Crestor [rosuvastatin], Dilaudid [hydromorphone], and Benadryl [diphenhydramine]  Review of Systems Review of Systems As noted in HPI  Physical Exam Vital Signs  I have reviewed the triage vital signs BP 135/87   Pulse 78   Temp 98.6 F (37 C) (Oral)   Resp 16   SpO2 100%   Physical Exam Vitals reviewed.  Constitutional:      General: He is not in acute distress.    Appearance: He is well-developed. He is not diaphoretic.  HENT:     Head: Normocephalic and atraumatic.     Right Ear: External ear normal.     Left Ear: External ear normal.     Nose: Nose  normal.     Mouth/Throat:     Mouth: Mucous membranes are moist.  Eyes:     General: No scleral icterus.    Conjunctiva/sclera: Conjunctivae normal.  Neck:     Trachea: Phonation normal.  Cardiovascular:     Rate and Rhythm: Normal rate and regular rhythm.  Pulmonary:     Effort: Pulmonary effort is normal. No respiratory distress.     Breath sounds: No stridor.  Abdominal:     General: There is no distension.  Musculoskeletal:        General: Normal range of motion.     Cervical back: Normal range of motion.       Legs:     Comments: S/p finger amputations on both hands  Neurological:     Mental Status: He is alert and oriented to person, place, and time.  Psychiatric:        Behavior: Behavior normal.     ED Results and Treatments Labs (all labs ordered are listed, but only abnormal results are displayed) Labs Reviewed - No data to display                                                                                                                       EKG  EKG Interpretation  Date/Time:    Ventricular Rate:    PR Interval:    QRS Duration:   QT Interval:    QTC Calculation:   R Axis:  Text Interpretation:         Radiology No results found.  Pertinent labs & imaging results that were available during my care of the patient were reviewed by me and considered in my medical decision making (see MDM for details).  Medications Ordered in ED Medications  oxyCODONE-acetaminophen (PERCOCET/ROXICET) 5-325 MG per tablet 1 tablet (1 tablet Oral Given 01/29/22 0305)                                                                                                                                     Procedures .Marland KitchenLaceration Repair  Date/Time: 01/29/2022 5:49 AM  Performed by: Fatima Blank, MD Authorized by: Fatima Blank, MD   Consent:    Consent obtained:  Verbal   Consent given by:  Patient   Risks discussed:  Infection, need for  additional repair and poor wound healing   Alternatives discussed:  No treatment Universal protocol:    Procedure explained and questions answered to patient or proxy's satisfaction: yes     Patient identity confirmed:  Arm band Anesthesia:    Anesthesia method:  None Laceration details:    Location:  Leg   Leg location:  R upper leg   Length (cm):  1   Depth (mm):  3 Pre-procedure details:    Preparation:  Patient was prepped and draped in usual sterile fashion Exploration:    Hemostasis achieved with:  Direct pressure   Wound extent: no foreign bodies/material noted and no muscle damage noted   Treatment:    Area cleansed with:  Chlorhexidine Skin repair:    Repair method:  Steri-Strips and tissue adhesive   Number of Steri-Strips:  1 Approximation:    Approximation:  Close Repair type:    Repair type:  Simple   (including critical care time)  Medical Decision Making / ED Course    Complexity of Problem:  Co-morbidities/SDOH that complicate the patient evaluation/care: Noted above in HPI Medical Decision Making Problems Addressed: Wound dehiscence: acute illness or injury      ED Course:    Assessment, Add'l Intervention, and Reassessment: Small wound dehiscence between 2 staples. No evidence of superimposed infection. Closed with a single Steri-Strip and Dermabond. Rebandaged.    Final Clinical Impression(s) / ED Diagnoses Final diagnoses:  Wound dehiscence   The patient appears reasonably screened and/or stabilized for discharge and I doubt any other medical condition or other [facility] requiring further screening, evaluation, or treatment in the ED at this time prior to discharge. Safe for discharge with strict return precautions.  Disposition: Discharge  Condition: Good  I have discussed the results, Dx and Tx plan with the patient/family who expressed understanding and agree(s) with the plan. Discharge instructions discussed at length. The  patient/family was given strict return precautions who verbalized understanding of the instructions. No further questions at time of discharge.    ED Discharge Orders     None  Follow Up: Waynetta Sandy, [place] [address] [phone]  Call  as needed  [name], [place] [address] [place] [address] [phone]  Call  as needed            This chart was dictated using voice recognition software.  Despite best efforts to proofread,  errors can occur which can change the documentation meaning.    Fatima Blank, MD 01/29/22 [phone]

## 2022-01-30 DIAGNOSIS — Z89611 Acquired absence of right leg above knee: Secondary | ICD-10-CM | POA: Diagnosis not present

## 2022-01-30 DIAGNOSIS — D631 Anemia in chronic kidney disease: Secondary | ICD-10-CM | POA: Diagnosis not present

## 2022-01-30 DIAGNOSIS — N186 End stage renal disease: Secondary | ICD-10-CM | POA: Diagnosis not present

## 2022-01-30 DIAGNOSIS — E1122 Type 2 diabetes mellitus with diabetic chronic kidney disease: Secondary | ICD-10-CM | POA: Diagnosis not present

## 2022-01-30 DIAGNOSIS — F32A Depression, unspecified: Secondary | ICD-10-CM | POA: Diagnosis not present

## 2022-01-30 DIAGNOSIS — Z7982 Long term (current) use of aspirin: Secondary | ICD-10-CM | POA: Diagnosis not present

## 2022-01-30 DIAGNOSIS — K219 Gastro-esophageal reflux disease without esophagitis: Secondary | ICD-10-CM | POA: Diagnosis not present

## 2022-01-30 DIAGNOSIS — Z9181 History of falling: Secondary | ICD-10-CM | POA: Diagnosis not present

## 2022-01-30 DIAGNOSIS — Z87891 Personal history of nicotine dependence: Secondary | ICD-10-CM | POA: Diagnosis not present

## 2022-01-30 DIAGNOSIS — E785 Hyperlipidemia, unspecified: Secondary | ICD-10-CM | POA: Diagnosis not present

## 2022-01-30 DIAGNOSIS — I252 Old myocardial infarction: Secondary | ICD-10-CM | POA: Diagnosis not present

## 2022-01-30 DIAGNOSIS — Z4781 Encounter for orthopedic aftercare following surgical amputation: Secondary | ICD-10-CM | POA: Diagnosis not present

## 2022-01-30 DIAGNOSIS — I2510 Atherosclerotic heart disease of native coronary artery without angina pectoris: Secondary | ICD-10-CM | POA: Diagnosis not present

## 2022-01-30 DIAGNOSIS — G4730 Sleep apnea, unspecified: Secondary | ICD-10-CM | POA: Diagnosis not present

## 2022-01-30 DIAGNOSIS — Z7902 Long term (current) use of antithrombotics/antiplatelets: Secondary | ICD-10-CM | POA: Diagnosis not present

## 2022-01-30 DIAGNOSIS — I5042 Chronic combined systolic (congestive) and diastolic (congestive) heart failure: Secondary | ICD-10-CM | POA: Diagnosis not present

## 2022-01-30 DIAGNOSIS — I132 Hypertensive heart and chronic kidney disease with heart failure and with stage 5 chronic kidney disease, or end stage renal disease: Secondary | ICD-10-CM | POA: Diagnosis not present

## 2022-01-30 DIAGNOSIS — E1151 Type 2 diabetes mellitus with diabetic peripheral angiopathy without gangrene: Secondary | ICD-10-CM | POA: Diagnosis not present

## 2022-01-30 DIAGNOSIS — Z89022 Acquired absence of left finger(s): Secondary | ICD-10-CM | POA: Diagnosis not present

## 2022-01-30 DIAGNOSIS — M1990 Unspecified osteoarthritis, unspecified site: Secondary | ICD-10-CM | POA: Diagnosis not present

## 2022-01-30 DIAGNOSIS — L97821 Non-pressure chronic ulcer of other part of left lower leg limited to breakdown of skin: Secondary | ICD-10-CM | POA: Diagnosis not present

## 2022-01-31 ENCOUNTER — Other Ambulatory Visit: Payer: Self-pay | Admitting: Vascular Surgery

## 2022-01-31 DIAGNOSIS — Z9181 History of falling: Secondary | ICD-10-CM | POA: Diagnosis not present

## 2022-01-31 DIAGNOSIS — M1990 Unspecified osteoarthritis, unspecified site: Secondary | ICD-10-CM | POA: Diagnosis not present

## 2022-01-31 DIAGNOSIS — E1122 Type 2 diabetes mellitus with diabetic chronic kidney disease: Secondary | ICD-10-CM | POA: Diagnosis not present

## 2022-01-31 DIAGNOSIS — F32A Depression, unspecified: Secondary | ICD-10-CM | POA: Diagnosis not present

## 2022-01-31 DIAGNOSIS — Z89611 Acquired absence of right leg above knee: Secondary | ICD-10-CM | POA: Diagnosis not present

## 2022-01-31 DIAGNOSIS — E785 Hyperlipidemia, unspecified: Secondary | ICD-10-CM | POA: Diagnosis not present

## 2022-01-31 DIAGNOSIS — Z87891 Personal history of nicotine dependence: Secondary | ICD-10-CM | POA: Diagnosis not present

## 2022-01-31 DIAGNOSIS — Z4781 Encounter for orthopedic aftercare following surgical amputation: Secondary | ICD-10-CM | POA: Diagnosis not present

## 2022-01-31 DIAGNOSIS — Z89022 Acquired absence of left finger(s): Secondary | ICD-10-CM | POA: Diagnosis not present

## 2022-01-31 DIAGNOSIS — S61200A Unspecified open wound of right index finger without damage to nail, initial encounter: Secondary | ICD-10-CM | POA: Diagnosis not present

## 2022-01-31 DIAGNOSIS — R52 Pain, unspecified: Secondary | ICD-10-CM | POA: Diagnosis not present

## 2022-01-31 DIAGNOSIS — E1151 Type 2 diabetes mellitus with diabetic peripheral angiopathy without gangrene: Secondary | ICD-10-CM | POA: Diagnosis not present

## 2022-01-31 DIAGNOSIS — G4730 Sleep apnea, unspecified: Secondary | ICD-10-CM | POA: Diagnosis not present

## 2022-01-31 DIAGNOSIS — Z992 Dependence on renal dialysis: Secondary | ICD-10-CM | POA: Diagnosis not present

## 2022-01-31 DIAGNOSIS — N2581 Secondary hyperparathyroidism of renal origin: Secondary | ICD-10-CM | POA: Diagnosis not present

## 2022-01-31 DIAGNOSIS — I132 Hypertensive heart and chronic kidney disease with heart failure and with stage 5 chronic kidney disease, or end stage renal disease: Secondary | ICD-10-CM | POA: Diagnosis not present

## 2022-01-31 DIAGNOSIS — K219 Gastro-esophageal reflux disease without esophagitis: Secondary | ICD-10-CM | POA: Diagnosis not present

## 2022-01-31 DIAGNOSIS — D509 Iron deficiency anemia, unspecified: Secondary | ICD-10-CM | POA: Diagnosis not present

## 2022-01-31 DIAGNOSIS — I2510 Atherosclerotic heart disease of native coronary artery without angina pectoris: Secondary | ICD-10-CM | POA: Diagnosis not present

## 2022-01-31 DIAGNOSIS — I5042 Chronic combined systolic (congestive) and diastolic (congestive) heart failure: Secondary | ICD-10-CM | POA: Diagnosis not present

## 2022-01-31 DIAGNOSIS — D631 Anemia in chronic kidney disease: Secondary | ICD-10-CM | POA: Diagnosis not present

## 2022-01-31 DIAGNOSIS — N186 End stage renal disease: Secondary | ICD-10-CM | POA: Diagnosis not present

## 2022-01-31 DIAGNOSIS — Z7902 Long term (current) use of antithrombotics/antiplatelets: Secondary | ICD-10-CM | POA: Diagnosis not present

## 2022-01-31 DIAGNOSIS — Z7982 Long term (current) use of aspirin: Secondary | ICD-10-CM | POA: Diagnosis not present

## 2022-01-31 DIAGNOSIS — E1129 Type 2 diabetes mellitus with other diabetic kidney complication: Secondary | ICD-10-CM | POA: Diagnosis not present

## 2022-01-31 DIAGNOSIS — I252 Old myocardial infarction: Secondary | ICD-10-CM | POA: Diagnosis not present

## 2022-01-31 DIAGNOSIS — D689 Coagulation defect, unspecified: Secondary | ICD-10-CM | POA: Diagnosis not present

## 2022-01-31 DIAGNOSIS — L97821 Non-pressure chronic ulcer of other part of left lower leg limited to breakdown of skin: Secondary | ICD-10-CM | POA: Diagnosis not present

## 2022-02-01 ENCOUNTER — Other Ambulatory Visit: Payer: Self-pay | Admitting: Nurse Practitioner

## 2022-02-02 DIAGNOSIS — D631 Anemia in chronic kidney disease: Secondary | ICD-10-CM | POA: Diagnosis not present

## 2022-02-02 DIAGNOSIS — D509 Iron deficiency anemia, unspecified: Secondary | ICD-10-CM | POA: Diagnosis not present

## 2022-02-02 DIAGNOSIS — N186 End stage renal disease: Secondary | ICD-10-CM | POA: Diagnosis not present

## 2022-02-02 DIAGNOSIS — E1129 Type 2 diabetes mellitus with other diabetic kidney complication: Secondary | ICD-10-CM | POA: Diagnosis not present

## 2022-02-02 DIAGNOSIS — R52 Pain, unspecified: Secondary | ICD-10-CM | POA: Diagnosis not present

## 2022-02-02 DIAGNOSIS — Z992 Dependence on renal dialysis: Secondary | ICD-10-CM | POA: Diagnosis not present

## 2022-02-02 DIAGNOSIS — D689 Coagulation defect, unspecified: Secondary | ICD-10-CM | POA: Diagnosis not present

## 2022-02-02 DIAGNOSIS — N2581 Secondary hyperparathyroidism of renal origin: Secondary | ICD-10-CM | POA: Diagnosis not present

## 2022-02-04 ENCOUNTER — Encounter: Payer: Medicare Other | Admitting: Physician Assistant

## 2022-02-05 DIAGNOSIS — Z992 Dependence on renal dialysis: Secondary | ICD-10-CM | POA: Diagnosis not present

## 2022-02-05 DIAGNOSIS — N186 End stage renal disease: Secondary | ICD-10-CM | POA: Diagnosis not present

## 2022-02-05 DIAGNOSIS — D509 Iron deficiency anemia, unspecified: Secondary | ICD-10-CM | POA: Diagnosis not present

## 2022-02-05 DIAGNOSIS — D631 Anemia in chronic kidney disease: Secondary | ICD-10-CM | POA: Diagnosis not present

## 2022-02-05 DIAGNOSIS — E1129 Type 2 diabetes mellitus with other diabetic kidney complication: Secondary | ICD-10-CM | POA: Diagnosis not present

## 2022-02-05 DIAGNOSIS — R52 Pain, unspecified: Secondary | ICD-10-CM | POA: Diagnosis not present

## 2022-02-05 DIAGNOSIS — D689 Coagulation defect, unspecified: Secondary | ICD-10-CM | POA: Diagnosis not present

## 2022-02-05 DIAGNOSIS — N2581 Secondary hyperparathyroidism of renal origin: Secondary | ICD-10-CM | POA: Diagnosis not present

## 2022-02-05 NOTE — Progress Notes (Signed)
This encounter was created in error - please disregard.

## 2022-02-06 DIAGNOSIS — E1151 Type 2 diabetes mellitus with diabetic peripheral angiopathy without gangrene: Secondary | ICD-10-CM | POA: Diagnosis not present

## 2022-02-06 DIAGNOSIS — Z87891 Personal history of nicotine dependence: Secondary | ICD-10-CM | POA: Diagnosis not present

## 2022-02-06 DIAGNOSIS — Z4781 Encounter for orthopedic aftercare following surgical amputation: Secondary | ICD-10-CM | POA: Diagnosis not present

## 2022-02-06 DIAGNOSIS — F32A Depression, unspecified: Secondary | ICD-10-CM | POA: Diagnosis not present

## 2022-02-06 DIAGNOSIS — Z89611 Acquired absence of right leg above knee: Secondary | ICD-10-CM | POA: Diagnosis not present

## 2022-02-06 DIAGNOSIS — I5042 Chronic combined systolic (congestive) and diastolic (congestive) heart failure: Secondary | ICD-10-CM | POA: Diagnosis not present

## 2022-02-06 DIAGNOSIS — I2510 Atherosclerotic heart disease of native coronary artery without angina pectoris: Secondary | ICD-10-CM | POA: Diagnosis not present

## 2022-02-06 DIAGNOSIS — E1122 Type 2 diabetes mellitus with diabetic chronic kidney disease: Secondary | ICD-10-CM | POA: Diagnosis not present

## 2022-02-06 DIAGNOSIS — G4730 Sleep apnea, unspecified: Secondary | ICD-10-CM | POA: Diagnosis not present

## 2022-02-06 DIAGNOSIS — K219 Gastro-esophageal reflux disease without esophagitis: Secondary | ICD-10-CM | POA: Diagnosis not present

## 2022-02-06 DIAGNOSIS — I132 Hypertensive heart and chronic kidney disease with heart failure and with stage 5 chronic kidney disease, or end stage renal disease: Secondary | ICD-10-CM | POA: Diagnosis not present

## 2022-02-06 DIAGNOSIS — Z7982 Long term (current) use of aspirin: Secondary | ICD-10-CM | POA: Diagnosis not present

## 2022-02-06 DIAGNOSIS — Z89022 Acquired absence of left finger(s): Secondary | ICD-10-CM | POA: Diagnosis not present

## 2022-02-06 DIAGNOSIS — E785 Hyperlipidemia, unspecified: Secondary | ICD-10-CM | POA: Diagnosis not present

## 2022-02-06 DIAGNOSIS — L97821 Non-pressure chronic ulcer of other part of left lower leg limited to breakdown of skin: Secondary | ICD-10-CM | POA: Diagnosis not present

## 2022-02-06 DIAGNOSIS — N186 End stage renal disease: Secondary | ICD-10-CM | POA: Diagnosis not present

## 2022-02-06 DIAGNOSIS — M1990 Unspecified osteoarthritis, unspecified site: Secondary | ICD-10-CM | POA: Diagnosis not present

## 2022-02-06 DIAGNOSIS — D631 Anemia in chronic kidney disease: Secondary | ICD-10-CM | POA: Diagnosis not present

## 2022-02-06 DIAGNOSIS — Z7902 Long term (current) use of antithrombotics/antiplatelets: Secondary | ICD-10-CM | POA: Diagnosis not present

## 2022-02-06 DIAGNOSIS — Z9181 History of falling: Secondary | ICD-10-CM | POA: Diagnosis not present

## 2022-02-06 DIAGNOSIS — I252 Old myocardial infarction: Secondary | ICD-10-CM | POA: Diagnosis not present

## 2022-02-07 DIAGNOSIS — D689 Coagulation defect, unspecified: Secondary | ICD-10-CM | POA: Diagnosis not present

## 2022-02-07 DIAGNOSIS — D631 Anemia in chronic kidney disease: Secondary | ICD-10-CM | POA: Diagnosis not present

## 2022-02-07 DIAGNOSIS — E1129 Type 2 diabetes mellitus with other diabetic kidney complication: Secondary | ICD-10-CM | POA: Diagnosis not present

## 2022-02-07 DIAGNOSIS — N2581 Secondary hyperparathyroidism of renal origin: Secondary | ICD-10-CM | POA: Diagnosis not present

## 2022-02-07 DIAGNOSIS — Z992 Dependence on renal dialysis: Secondary | ICD-10-CM | POA: Diagnosis not present

## 2022-02-07 DIAGNOSIS — R52 Pain, unspecified: Secondary | ICD-10-CM | POA: Diagnosis not present

## 2022-02-07 DIAGNOSIS — D509 Iron deficiency anemia, unspecified: Secondary | ICD-10-CM | POA: Diagnosis not present

## 2022-02-07 DIAGNOSIS — N186 End stage renal disease: Secondary | ICD-10-CM | POA: Diagnosis not present

## 2022-02-07 NOTE — Progress Notes (Signed)
POST OPERATIVE OFFICE NOTE    CC:  F/u for surgery  HPI:  This is a 51 y.o. male who is s/p right AKA by Dr. Donzetta Matters on 01/08/22. He unfortunately had undergone multiple attempts at limb salvage but had no revascularization options and too extensive of tissue loss to save his leg so he had right BKA by Dr. Sharol Given initially 06/19/2018. He subsequently had non healing right BKA and was indicated for more proximal amputation. He presents today to have staples removed. He reports he has been healing well with only one incident. On 01/28/22 the patient went to the ED for bleeding coming from his AKA. At the ER, it was found that one of the staples damaged his skin and caused some bleeding. The 1 cm laceration was fixed with a steri-strip and dermabond, then the patient was discharged home. Since then, the patient has had no issues. He reports some phantom pains, but his gabapentin helps.  He has one wound on his left lower leg, which is getting better and being followed by the wound care center.   He denies any rest pain, new non-healing wounds, fevers, or discharge from his incision sites.  He has ESRD and a functioning BV fistula. He dialyzes on T,Th,Sat.  Allergies  Allergen Reactions   Crestor [Rosuvastatin] Rash    Also had a red eye   Dilaudid [Hydromorphone] Itching   Benadryl [Diphenhydramine] Rash    Current Outpatient Medications  Medication Sig Dispense Refill   acetaminophen (TYLENOL) 500 MG tablet Take 500-1,000 mg by mouth every 6 (six) hours as needed for moderate pain or headache.     aspirin EC 81 MG tablet Take 1 tablet (81 mg total) by mouth daily. Swallow whole. 90 tablet 3   atorvastatin (LIPITOR) 80 MG tablet Take 80 mg by mouth daily.     bismuth subsalicylate (PEPTO BISMOL) 262 MG/15ML suspension Take 30 mLs by mouth every 6 (six) hours as needed for indigestion.     blood glucose meter kit and supplies Dispense based on patient and insurance preference. Use up to four times  daily as directed. (FOR ICD-9 250.00, 250.01). 1 each 0   clopidogrel (PLAVIX) 75 MG tablet TAKE ONE TABLET BY MOUTH ONCE DAILY 30 tablet 11   ezetimibe (ZETIA) 10 MG tablet Take 1 tablet (10 mg total) by mouth daily. 30 tablet 3   gabapentin (NEURONTIN) 100 MG capsule Take 100 mg by mouth 3 (three) times daily.     insulin aspart protamine - aspart (NOVOLOG MIX 70/30 FLEXPEN) (70-30) 100 UNIT/ML FlexPen Inject 0.3 mLs (30 Units total) into the skin 2 (two) times daily with a meal. 15 mL 0   Insulin Pen Needle 31G X 5 MM MISC Use daily to inject insulin as instructed 100 each 2   lidocaine-prilocaine (EMLA) cream Apply 1 application topically See admin instructions. Apply a small amount to access site (AVF) 1-2 hours before dialysis. Cover with occlusive dressing (saran wrap). Tuesday,Thursday,saturday     linaclotide (LINZESS) 72 MCG capsule Take 1 capsule (72 mcg total) by mouth daily before breakfast. (Patient taking differently: Take 72 mcg by mouth daily as needed (constipation).) 8 capsule 0   pantoprazole (PROTONIX) 40 MG tablet TAKE ONE TABLET BY MOUTH EVERY MORNING and TAKE ONE TABLET BY MOUTH EVERYDAY AT BEDTIME 180 tablet 1   triamcinolone ointment (KENALOG) 0.1 % 1 application. daily as needed (rash).     VELPHORO 500 MG chewable tablet Chew 1,000 mg by mouth 3 (three) times  daily with meals.     No current facility-administered medications for this visit.     ROS:  See HPI  Physical Exam:  There were no vitals filed for this visit.  Incision:  R AKA staples are removed with minimal bleeding. Incision healing well with no drainage or dehiscence. 1 steri strip present. Skin is dry and soft with no swelling or hematoma. Extremities:  L lower extremity with one wound, dressed with gauze and ace wrap.   Assessment/Plan:  This is a 51 y.o. male who is s/p: s/p right AKA by Dr. Donzetta Matters on 01/08/22.   -R AKA incision site staples have been removed at this visit with minimal bleeding.  The incision is healing well with no drainage, swelling, or dehiscence. -The patient has a left lower extremity wound that is slowly healing and being followed by the wound care center. The patient underwent proximal popliteal revascularization in 03/2021 to aid in wound healing. Based off angiography in 03/2021, the patient could be a candidate for anterior tibial artery revascularization if the wound fails to heal. -The patient will follow up in 6 months with LLE ABI  -Will refer to [facility] for prosthesis management.  -The patient has a right Above Knee Amputation. The patient is well motivated to return to their prior functional status by utilizing a prosthesis to perform ADL's and maintain a healthy lifestyle. The patient has the physical and cognitive capacity to function with a prosthesis.   Functional Level: K2 Limited Community Ambulator: Has the ability or potential for ambulation and to traverse low environmental barriers such as curbs, stairs or uneven surfaces  Residual Limb History: The skin condition of the residual limb is good. The patient will continue to monitor the skin of the residual limb and follow hygiene instructions.  The patient is experiencing phantom limb pain   Prosthetic Prescription Plan: Counseling and education regarding prosthetic management will be provided to the patient via a certified prosthetist. A multi-discipline team, including physical therapy, will manage the prosthetic fabrication, fitting and prosthetic gait training.    Vicente Serene, PA-C [facility] [phone]  Clinic MD:  [place]/ Donzetta Matters

## 2022-02-09 DIAGNOSIS — R52 Pain, unspecified: Secondary | ICD-10-CM | POA: Diagnosis not present

## 2022-02-09 DIAGNOSIS — N2581 Secondary hyperparathyroidism of renal origin: Secondary | ICD-10-CM | POA: Diagnosis not present

## 2022-02-09 DIAGNOSIS — D509 Iron deficiency anemia, unspecified: Secondary | ICD-10-CM | POA: Diagnosis not present

## 2022-02-09 DIAGNOSIS — D689 Coagulation defect, unspecified: Secondary | ICD-10-CM | POA: Diagnosis not present

## 2022-02-09 DIAGNOSIS — Z992 Dependence on renal dialysis: Secondary | ICD-10-CM | POA: Diagnosis not present

## 2022-02-09 DIAGNOSIS — D631 Anemia in chronic kidney disease: Secondary | ICD-10-CM | POA: Diagnosis not present

## 2022-02-09 DIAGNOSIS — N186 End stage renal disease: Secondary | ICD-10-CM | POA: Diagnosis not present

## 2022-02-09 DIAGNOSIS — E1129 Type 2 diabetes mellitus with other diabetic kidney complication: Secondary | ICD-10-CM | POA: Diagnosis not present

## 2022-02-12 DIAGNOSIS — D689 Coagulation defect, unspecified: Secondary | ICD-10-CM | POA: Diagnosis not present

## 2022-02-12 DIAGNOSIS — N2581 Secondary hyperparathyroidism of renal origin: Secondary | ICD-10-CM | POA: Diagnosis not present

## 2022-02-12 DIAGNOSIS — E1129 Type 2 diabetes mellitus with other diabetic kidney complication: Secondary | ICD-10-CM | POA: Diagnosis not present

## 2022-02-12 DIAGNOSIS — Z992 Dependence on renal dialysis: Secondary | ICD-10-CM | POA: Diagnosis not present

## 2022-02-12 DIAGNOSIS — R52 Pain, unspecified: Secondary | ICD-10-CM | POA: Diagnosis not present

## 2022-02-12 DIAGNOSIS — N186 End stage renal disease: Secondary | ICD-10-CM | POA: Diagnosis not present

## 2022-02-12 DIAGNOSIS — D509 Iron deficiency anemia, unspecified: Secondary | ICD-10-CM | POA: Diagnosis not present

## 2022-02-12 DIAGNOSIS — D631 Anemia in chronic kidney disease: Secondary | ICD-10-CM | POA: Diagnosis not present

## 2022-02-13 ENCOUNTER — Encounter: Payer: Self-pay | Admitting: Physician Assistant

## 2022-02-13 ENCOUNTER — Ambulatory Visit (INDEPENDENT_AMBULATORY_CARE_PROVIDER_SITE_OTHER): Payer: Medicare Other | Admitting: Physician Assistant

## 2022-02-13 VITALS — BP 115/66 | HR 88 | Temp 98.0°F | Resp 16 | Ht 72.0 in | Wt 215.0 lb

## 2022-02-13 DIAGNOSIS — M1990 Unspecified osteoarthritis, unspecified site: Secondary | ICD-10-CM | POA: Diagnosis not present

## 2022-02-13 DIAGNOSIS — N186 End stage renal disease: Secondary | ICD-10-CM

## 2022-02-13 DIAGNOSIS — Z7982 Long term (current) use of aspirin: Secondary | ICD-10-CM | POA: Diagnosis not present

## 2022-02-13 DIAGNOSIS — G4730 Sleep apnea, unspecified: Secondary | ICD-10-CM | POA: Diagnosis not present

## 2022-02-13 DIAGNOSIS — I132 Hypertensive heart and chronic kidney disease with heart failure and with stage 5 chronic kidney disease, or end stage renal disease: Secondary | ICD-10-CM | POA: Diagnosis not present

## 2022-02-13 DIAGNOSIS — E1151 Type 2 diabetes mellitus with diabetic peripheral angiopathy without gangrene: Secondary | ICD-10-CM | POA: Diagnosis not present

## 2022-02-13 DIAGNOSIS — Z89611 Acquired absence of right leg above knee: Secondary | ICD-10-CM | POA: Diagnosis not present

## 2022-02-13 DIAGNOSIS — E1122 Type 2 diabetes mellitus with diabetic chronic kidney disease: Secondary | ICD-10-CM | POA: Diagnosis not present

## 2022-02-13 DIAGNOSIS — K219 Gastro-esophageal reflux disease without esophagitis: Secondary | ICD-10-CM | POA: Diagnosis not present

## 2022-02-13 DIAGNOSIS — Z89022 Acquired absence of left finger(s): Secondary | ICD-10-CM | POA: Diagnosis not present

## 2022-02-13 DIAGNOSIS — Z4781 Encounter for orthopedic aftercare following surgical amputation: Secondary | ICD-10-CM | POA: Diagnosis not present

## 2022-02-13 DIAGNOSIS — I2510 Atherosclerotic heart disease of native coronary artery without angina pectoris: Secondary | ICD-10-CM | POA: Diagnosis not present

## 2022-02-13 DIAGNOSIS — L97821 Non-pressure chronic ulcer of other part of left lower leg limited to breakdown of skin: Secondary | ICD-10-CM | POA: Diagnosis not present

## 2022-02-13 DIAGNOSIS — F32A Depression, unspecified: Secondary | ICD-10-CM | POA: Diagnosis not present

## 2022-02-13 DIAGNOSIS — Z7902 Long term (current) use of antithrombotics/antiplatelets: Secondary | ICD-10-CM | POA: Diagnosis not present

## 2022-02-13 DIAGNOSIS — Z9181 History of falling: Secondary | ICD-10-CM | POA: Diagnosis not present

## 2022-02-13 DIAGNOSIS — D631 Anemia in chronic kidney disease: Secondary | ICD-10-CM | POA: Diagnosis not present

## 2022-02-13 DIAGNOSIS — Z992 Dependence on renal dialysis: Secondary | ICD-10-CM

## 2022-02-13 DIAGNOSIS — I5042 Chronic combined systolic (congestive) and diastolic (congestive) heart failure: Secondary | ICD-10-CM | POA: Diagnosis not present

## 2022-02-13 DIAGNOSIS — E785 Hyperlipidemia, unspecified: Secondary | ICD-10-CM | POA: Diagnosis not present

## 2022-02-13 DIAGNOSIS — I252 Old myocardial infarction: Secondary | ICD-10-CM | POA: Diagnosis not present

## 2022-02-13 DIAGNOSIS — Z87891 Personal history of nicotine dependence: Secondary | ICD-10-CM | POA: Diagnosis not present

## 2022-02-14 DIAGNOSIS — E1129 Type 2 diabetes mellitus with other diabetic kidney complication: Secondary | ICD-10-CM | POA: Diagnosis not present

## 2022-02-14 DIAGNOSIS — D689 Coagulation defect, unspecified: Secondary | ICD-10-CM | POA: Diagnosis not present

## 2022-02-14 DIAGNOSIS — D509 Iron deficiency anemia, unspecified: Secondary | ICD-10-CM | POA: Diagnosis not present

## 2022-02-14 DIAGNOSIS — Z992 Dependence on renal dialysis: Secondary | ICD-10-CM | POA: Diagnosis not present

## 2022-02-14 DIAGNOSIS — R52 Pain, unspecified: Secondary | ICD-10-CM | POA: Diagnosis not present

## 2022-02-14 DIAGNOSIS — D631 Anemia in chronic kidney disease: Secondary | ICD-10-CM | POA: Diagnosis not present

## 2022-02-14 DIAGNOSIS — N2581 Secondary hyperparathyroidism of renal origin: Secondary | ICD-10-CM | POA: Diagnosis not present

## 2022-02-14 DIAGNOSIS — N186 End stage renal disease: Secondary | ICD-10-CM | POA: Diagnosis not present

## 2022-02-15 DIAGNOSIS — F32A Depression, unspecified: Secondary | ICD-10-CM | POA: Diagnosis not present

## 2022-02-15 DIAGNOSIS — Z4781 Encounter for orthopedic aftercare following surgical amputation: Secondary | ICD-10-CM | POA: Diagnosis not present

## 2022-02-15 DIAGNOSIS — E1122 Type 2 diabetes mellitus with diabetic chronic kidney disease: Secondary | ICD-10-CM | POA: Diagnosis not present

## 2022-02-15 DIAGNOSIS — Z7982 Long term (current) use of aspirin: Secondary | ICD-10-CM | POA: Diagnosis not present

## 2022-02-15 DIAGNOSIS — Z89022 Acquired absence of left finger(s): Secondary | ICD-10-CM | POA: Diagnosis not present

## 2022-02-15 DIAGNOSIS — E785 Hyperlipidemia, unspecified: Secondary | ICD-10-CM | POA: Diagnosis not present

## 2022-02-15 DIAGNOSIS — I130 Hypertensive heart and chronic kidney disease with heart failure and stage 1 through stage 4 chronic kidney disease, or unspecified chronic kidney disease: Secondary | ICD-10-CM | POA: Diagnosis not present

## 2022-02-15 DIAGNOSIS — Z9181 History of falling: Secondary | ICD-10-CM | POA: Diagnosis not present

## 2022-02-15 DIAGNOSIS — Z7902 Long term (current) use of antithrombotics/antiplatelets: Secondary | ICD-10-CM | POA: Diagnosis not present

## 2022-02-15 DIAGNOSIS — I2510 Atherosclerotic heart disease of native coronary artery without angina pectoris: Secondary | ICD-10-CM | POA: Diagnosis not present

## 2022-02-15 DIAGNOSIS — I132 Hypertensive heart and chronic kidney disease with heart failure and with stage 5 chronic kidney disease, or end stage renal disease: Secondary | ICD-10-CM | POA: Diagnosis not present

## 2022-02-15 DIAGNOSIS — Z89611 Acquired absence of right leg above knee: Secondary | ICD-10-CM | POA: Diagnosis not present

## 2022-02-15 DIAGNOSIS — N186 End stage renal disease: Secondary | ICD-10-CM | POA: Diagnosis not present

## 2022-02-15 DIAGNOSIS — Z992 Dependence on renal dialysis: Secondary | ICD-10-CM | POA: Diagnosis not present

## 2022-02-15 DIAGNOSIS — I5042 Chronic combined systolic (congestive) and diastolic (congestive) heart failure: Secondary | ICD-10-CM | POA: Diagnosis not present

## 2022-02-15 DIAGNOSIS — M1990 Unspecified osteoarthritis, unspecified site: Secondary | ICD-10-CM | POA: Diagnosis not present

## 2022-02-15 DIAGNOSIS — L97821 Non-pressure chronic ulcer of other part of left lower leg limited to breakdown of skin: Secondary | ICD-10-CM | POA: Diagnosis not present

## 2022-02-15 DIAGNOSIS — E1151 Type 2 diabetes mellitus with diabetic peripheral angiopathy without gangrene: Secondary | ICD-10-CM | POA: Diagnosis not present

## 2022-02-15 DIAGNOSIS — Z87891 Personal history of nicotine dependence: Secondary | ICD-10-CM | POA: Diagnosis not present

## 2022-02-15 DIAGNOSIS — G4730 Sleep apnea, unspecified: Secondary | ICD-10-CM | POA: Diagnosis not present

## 2022-02-15 DIAGNOSIS — E1129 Type 2 diabetes mellitus with other diabetic kidney complication: Secondary | ICD-10-CM | POA: Diagnosis not present

## 2022-02-15 DIAGNOSIS — I252 Old myocardial infarction: Secondary | ICD-10-CM | POA: Diagnosis not present

## 2022-02-15 DIAGNOSIS — D631 Anemia in chronic kidney disease: Secondary | ICD-10-CM | POA: Diagnosis not present

## 2022-02-15 DIAGNOSIS — K219 Gastro-esophageal reflux disease without esophagitis: Secondary | ICD-10-CM | POA: Diagnosis not present

## 2022-02-16 DIAGNOSIS — D689 Coagulation defect, unspecified: Secondary | ICD-10-CM | POA: Diagnosis not present

## 2022-02-16 DIAGNOSIS — Z992 Dependence on renal dialysis: Secondary | ICD-10-CM | POA: Diagnosis not present

## 2022-02-16 DIAGNOSIS — E1129 Type 2 diabetes mellitus with other diabetic kidney complication: Secondary | ICD-10-CM | POA: Diagnosis not present

## 2022-02-16 DIAGNOSIS — N186 End stage renal disease: Secondary | ICD-10-CM | POA: Diagnosis not present

## 2022-02-16 DIAGNOSIS — D509 Iron deficiency anemia, unspecified: Secondary | ICD-10-CM | POA: Diagnosis not present

## 2022-02-16 DIAGNOSIS — D631 Anemia in chronic kidney disease: Secondary | ICD-10-CM | POA: Diagnosis not present

## 2022-02-16 DIAGNOSIS — R52 Pain, unspecified: Secondary | ICD-10-CM | POA: Diagnosis not present

## 2022-02-16 DIAGNOSIS — N2581 Secondary hyperparathyroidism of renal origin: Secondary | ICD-10-CM | POA: Diagnosis not present

## 2022-02-19 DIAGNOSIS — M1990 Unspecified osteoarthritis, unspecified site: Secondary | ICD-10-CM | POA: Diagnosis not present

## 2022-02-19 DIAGNOSIS — Z7902 Long term (current) use of antithrombotics/antiplatelets: Secondary | ICD-10-CM | POA: Diagnosis not present

## 2022-02-19 DIAGNOSIS — F32A Depression, unspecified: Secondary | ICD-10-CM | POA: Diagnosis not present

## 2022-02-19 DIAGNOSIS — Z992 Dependence on renal dialysis: Secondary | ICD-10-CM | POA: Diagnosis not present

## 2022-02-19 DIAGNOSIS — D689 Coagulation defect, unspecified: Secondary | ICD-10-CM | POA: Diagnosis not present

## 2022-02-19 DIAGNOSIS — D631 Anemia in chronic kidney disease: Secondary | ICD-10-CM | POA: Diagnosis not present

## 2022-02-19 DIAGNOSIS — K219 Gastro-esophageal reflux disease without esophagitis: Secondary | ICD-10-CM | POA: Diagnosis not present

## 2022-02-19 DIAGNOSIS — I252 Old myocardial infarction: Secondary | ICD-10-CM | POA: Diagnosis not present

## 2022-02-19 DIAGNOSIS — Z89611 Acquired absence of right leg above knee: Secondary | ICD-10-CM | POA: Diagnosis not present

## 2022-02-19 DIAGNOSIS — Z89022 Acquired absence of left finger(s): Secondary | ICD-10-CM | POA: Diagnosis not present

## 2022-02-19 DIAGNOSIS — D509 Iron deficiency anemia, unspecified: Secondary | ICD-10-CM | POA: Diagnosis not present

## 2022-02-19 DIAGNOSIS — E1151 Type 2 diabetes mellitus with diabetic peripheral angiopathy without gangrene: Secondary | ICD-10-CM | POA: Diagnosis not present

## 2022-02-19 DIAGNOSIS — R52 Pain, unspecified: Secondary | ICD-10-CM | POA: Diagnosis not present

## 2022-02-19 DIAGNOSIS — E1129 Type 2 diabetes mellitus with other diabetic kidney complication: Secondary | ICD-10-CM | POA: Diagnosis not present

## 2022-02-19 DIAGNOSIS — G4730 Sleep apnea, unspecified: Secondary | ICD-10-CM | POA: Diagnosis not present

## 2022-02-19 DIAGNOSIS — E785 Hyperlipidemia, unspecified: Secondary | ICD-10-CM | POA: Diagnosis not present

## 2022-02-19 DIAGNOSIS — I132 Hypertensive heart and chronic kidney disease with heart failure and with stage 5 chronic kidney disease, or end stage renal disease: Secondary | ICD-10-CM | POA: Diagnosis not present

## 2022-02-19 DIAGNOSIS — Z7982 Long term (current) use of aspirin: Secondary | ICD-10-CM | POA: Diagnosis not present

## 2022-02-19 DIAGNOSIS — Z87891 Personal history of nicotine dependence: Secondary | ICD-10-CM | POA: Diagnosis not present

## 2022-02-19 DIAGNOSIS — I5042 Chronic combined systolic (congestive) and diastolic (congestive) heart failure: Secondary | ICD-10-CM | POA: Diagnosis not present

## 2022-02-19 DIAGNOSIS — N2581 Secondary hyperparathyroidism of renal origin: Secondary | ICD-10-CM | POA: Diagnosis not present

## 2022-02-19 DIAGNOSIS — L97821 Non-pressure chronic ulcer of other part of left lower leg limited to breakdown of skin: Secondary | ICD-10-CM | POA: Diagnosis not present

## 2022-02-19 DIAGNOSIS — N186 End stage renal disease: Secondary | ICD-10-CM | POA: Diagnosis not present

## 2022-02-19 DIAGNOSIS — E1122 Type 2 diabetes mellitus with diabetic chronic kidney disease: Secondary | ICD-10-CM | POA: Diagnosis not present

## 2022-02-19 DIAGNOSIS — I2510 Atherosclerotic heart disease of native coronary artery without angina pectoris: Secondary | ICD-10-CM | POA: Diagnosis not present

## 2022-02-19 DIAGNOSIS — Z4781 Encounter for orthopedic aftercare following surgical amputation: Secondary | ICD-10-CM | POA: Diagnosis not present

## 2022-02-19 DIAGNOSIS — Z9181 History of falling: Secondary | ICD-10-CM | POA: Diagnosis not present

## 2022-02-21 ENCOUNTER — Other Ambulatory Visit: Payer: Self-pay

## 2022-02-21 DIAGNOSIS — N186 End stage renal disease: Secondary | ICD-10-CM | POA: Diagnosis not present

## 2022-02-21 DIAGNOSIS — I739 Peripheral vascular disease, unspecified: Secondary | ICD-10-CM

## 2022-02-21 DIAGNOSIS — D509 Iron deficiency anemia, unspecified: Secondary | ICD-10-CM | POA: Diagnosis not present

## 2022-02-21 DIAGNOSIS — E1129 Type 2 diabetes mellitus with other diabetic kidney complication: Secondary | ICD-10-CM | POA: Diagnosis not present

## 2022-02-21 DIAGNOSIS — R52 Pain, unspecified: Secondary | ICD-10-CM | POA: Diagnosis not present

## 2022-02-21 DIAGNOSIS — D631 Anemia in chronic kidney disease: Secondary | ICD-10-CM | POA: Diagnosis not present

## 2022-02-21 DIAGNOSIS — Z992 Dependence on renal dialysis: Secondary | ICD-10-CM | POA: Diagnosis not present

## 2022-02-21 DIAGNOSIS — D689 Coagulation defect, unspecified: Secondary | ICD-10-CM | POA: Diagnosis not present

## 2022-02-21 DIAGNOSIS — N2581 Secondary hyperparathyroidism of renal origin: Secondary | ICD-10-CM | POA: Diagnosis not present

## 2022-02-23 DIAGNOSIS — N186 End stage renal disease: Secondary | ICD-10-CM | POA: Diagnosis not present

## 2022-02-23 DIAGNOSIS — D509 Iron deficiency anemia, unspecified: Secondary | ICD-10-CM | POA: Diagnosis not present

## 2022-02-23 DIAGNOSIS — D689 Coagulation defect, unspecified: Secondary | ICD-10-CM | POA: Diagnosis not present

## 2022-02-23 DIAGNOSIS — D631 Anemia in chronic kidney disease: Secondary | ICD-10-CM | POA: Diagnosis not present

## 2022-02-23 DIAGNOSIS — E1129 Type 2 diabetes mellitus with other diabetic kidney complication: Secondary | ICD-10-CM | POA: Diagnosis not present

## 2022-02-23 DIAGNOSIS — N2581 Secondary hyperparathyroidism of renal origin: Secondary | ICD-10-CM | POA: Diagnosis not present

## 2022-02-23 DIAGNOSIS — R52 Pain, unspecified: Secondary | ICD-10-CM | POA: Diagnosis not present

## 2022-02-23 DIAGNOSIS — Z992 Dependence on renal dialysis: Secondary | ICD-10-CM | POA: Diagnosis not present

## 2022-02-24 DIAGNOSIS — L97821 Non-pressure chronic ulcer of other part of left lower leg limited to breakdown of skin: Secondary | ICD-10-CM | POA: Diagnosis not present

## 2022-02-25 DIAGNOSIS — I5041 Acute combined systolic (congestive) and diastolic (congestive) heart failure: Secondary | ICD-10-CM | POA: Diagnosis not present

## 2022-02-25 DIAGNOSIS — N186 End stage renal disease: Secondary | ICD-10-CM | POA: Diagnosis not present

## 2022-02-25 DIAGNOSIS — G546 Phantom limb syndrome with pain: Secondary | ICD-10-CM | POA: Diagnosis not present

## 2022-02-25 DIAGNOSIS — I739 Peripheral vascular disease, unspecified: Secondary | ICD-10-CM | POA: Diagnosis not present

## 2022-02-25 DIAGNOSIS — D631 Anemia in chronic kidney disease: Secondary | ICD-10-CM | POA: Diagnosis not present

## 2022-02-25 DIAGNOSIS — E1122 Type 2 diabetes mellitus with diabetic chronic kidney disease: Secondary | ICD-10-CM | POA: Diagnosis not present

## 2022-02-25 DIAGNOSIS — I110 Hypertensive heart disease with heart failure: Secondary | ICD-10-CM | POA: Diagnosis not present

## 2022-02-26 DIAGNOSIS — D509 Iron deficiency anemia, unspecified: Secondary | ICD-10-CM | POA: Diagnosis not present

## 2022-02-26 DIAGNOSIS — Z89022 Acquired absence of left finger(s): Secondary | ICD-10-CM | POA: Diagnosis not present

## 2022-02-26 DIAGNOSIS — Z87891 Personal history of nicotine dependence: Secondary | ICD-10-CM | POA: Diagnosis not present

## 2022-02-26 DIAGNOSIS — F32A Depression, unspecified: Secondary | ICD-10-CM | POA: Diagnosis not present

## 2022-02-26 DIAGNOSIS — E1122 Type 2 diabetes mellitus with diabetic chronic kidney disease: Secondary | ICD-10-CM | POA: Diagnosis not present

## 2022-02-26 DIAGNOSIS — K219 Gastro-esophageal reflux disease without esophagitis: Secondary | ICD-10-CM | POA: Diagnosis not present

## 2022-02-26 DIAGNOSIS — R52 Pain, unspecified: Secondary | ICD-10-CM | POA: Diagnosis not present

## 2022-02-26 DIAGNOSIS — I252 Old myocardial infarction: Secondary | ICD-10-CM | POA: Diagnosis not present

## 2022-02-26 DIAGNOSIS — I5042 Chronic combined systolic (congestive) and diastolic (congestive) heart failure: Secondary | ICD-10-CM | POA: Diagnosis not present

## 2022-02-26 DIAGNOSIS — Z4781 Encounter for orthopedic aftercare following surgical amputation: Secondary | ICD-10-CM | POA: Diagnosis not present

## 2022-02-26 DIAGNOSIS — L97821 Non-pressure chronic ulcer of other part of left lower leg limited to breakdown of skin: Secondary | ICD-10-CM | POA: Diagnosis not present

## 2022-02-26 DIAGNOSIS — E785 Hyperlipidemia, unspecified: Secondary | ICD-10-CM | POA: Diagnosis not present

## 2022-02-26 DIAGNOSIS — Z7902 Long term (current) use of antithrombotics/antiplatelets: Secondary | ICD-10-CM | POA: Diagnosis not present

## 2022-02-26 DIAGNOSIS — N186 End stage renal disease: Secondary | ICD-10-CM | POA: Diagnosis not present

## 2022-02-26 DIAGNOSIS — E1129 Type 2 diabetes mellitus with other diabetic kidney complication: Secondary | ICD-10-CM | POA: Diagnosis not present

## 2022-02-26 DIAGNOSIS — D689 Coagulation defect, unspecified: Secondary | ICD-10-CM | POA: Diagnosis not present

## 2022-02-26 DIAGNOSIS — Z7982 Long term (current) use of aspirin: Secondary | ICD-10-CM | POA: Diagnosis not present

## 2022-02-26 DIAGNOSIS — M1990 Unspecified osteoarthritis, unspecified site: Secondary | ICD-10-CM | POA: Diagnosis not present

## 2022-02-26 DIAGNOSIS — D631 Anemia in chronic kidney disease: Secondary | ICD-10-CM | POA: Diagnosis not present

## 2022-02-26 DIAGNOSIS — I132 Hypertensive heart and chronic kidney disease with heart failure and with stage 5 chronic kidney disease, or end stage renal disease: Secondary | ICD-10-CM | POA: Diagnosis not present

## 2022-02-26 DIAGNOSIS — Z89611 Acquired absence of right leg above knee: Secondary | ICD-10-CM | POA: Diagnosis not present

## 2022-02-26 DIAGNOSIS — Z992 Dependence on renal dialysis: Secondary | ICD-10-CM | POA: Diagnosis not present

## 2022-02-26 DIAGNOSIS — E1151 Type 2 diabetes mellitus with diabetic peripheral angiopathy without gangrene: Secondary | ICD-10-CM | POA: Diagnosis not present

## 2022-02-26 DIAGNOSIS — G4730 Sleep apnea, unspecified: Secondary | ICD-10-CM | POA: Diagnosis not present

## 2022-02-26 DIAGNOSIS — N2581 Secondary hyperparathyroidism of renal origin: Secondary | ICD-10-CM | POA: Diagnosis not present

## 2022-02-26 DIAGNOSIS — Z9181 History of falling: Secondary | ICD-10-CM | POA: Diagnosis not present

## 2022-02-26 DIAGNOSIS — I2510 Atherosclerotic heart disease of native coronary artery without angina pectoris: Secondary | ICD-10-CM | POA: Diagnosis not present

## 2022-02-28 DIAGNOSIS — Z992 Dependence on renal dialysis: Secondary | ICD-10-CM | POA: Diagnosis not present

## 2022-02-28 DIAGNOSIS — N2581 Secondary hyperparathyroidism of renal origin: Secondary | ICD-10-CM | POA: Diagnosis not present

## 2022-02-28 DIAGNOSIS — D631 Anemia in chronic kidney disease: Secondary | ICD-10-CM | POA: Diagnosis not present

## 2022-02-28 DIAGNOSIS — D689 Coagulation defect, unspecified: Secondary | ICD-10-CM | POA: Diagnosis not present

## 2022-02-28 DIAGNOSIS — E1129 Type 2 diabetes mellitus with other diabetic kidney complication: Secondary | ICD-10-CM | POA: Diagnosis not present

## 2022-02-28 DIAGNOSIS — R52 Pain, unspecified: Secondary | ICD-10-CM | POA: Diagnosis not present

## 2022-02-28 DIAGNOSIS — D509 Iron deficiency anemia, unspecified: Secondary | ICD-10-CM | POA: Diagnosis not present

## 2022-02-28 DIAGNOSIS — N186 End stage renal disease: Secondary | ICD-10-CM | POA: Diagnosis not present

## 2022-03-01 DIAGNOSIS — F32A Depression, unspecified: Secondary | ICD-10-CM | POA: Diagnosis not present

## 2022-03-01 DIAGNOSIS — Z89022 Acquired absence of left finger(s): Secondary | ICD-10-CM | POA: Diagnosis not present

## 2022-03-01 DIAGNOSIS — K219 Gastro-esophageal reflux disease without esophagitis: Secondary | ICD-10-CM | POA: Diagnosis not present

## 2022-03-01 DIAGNOSIS — Z89611 Acquired absence of right leg above knee: Secondary | ICD-10-CM | POA: Diagnosis not present

## 2022-03-01 DIAGNOSIS — Z7902 Long term (current) use of antithrombotics/antiplatelets: Secondary | ICD-10-CM | POA: Diagnosis not present

## 2022-03-01 DIAGNOSIS — D631 Anemia in chronic kidney disease: Secondary | ICD-10-CM | POA: Diagnosis not present

## 2022-03-01 DIAGNOSIS — Z4781 Encounter for orthopedic aftercare following surgical amputation: Secondary | ICD-10-CM | POA: Diagnosis not present

## 2022-03-01 DIAGNOSIS — G4730 Sleep apnea, unspecified: Secondary | ICD-10-CM | POA: Diagnosis not present

## 2022-03-01 DIAGNOSIS — Z87891 Personal history of nicotine dependence: Secondary | ICD-10-CM | POA: Diagnosis not present

## 2022-03-01 DIAGNOSIS — L97821 Non-pressure chronic ulcer of other part of left lower leg limited to breakdown of skin: Secondary | ICD-10-CM | POA: Diagnosis not present

## 2022-03-01 DIAGNOSIS — N186 End stage renal disease: Secondary | ICD-10-CM | POA: Diagnosis not present

## 2022-03-01 DIAGNOSIS — E1151 Type 2 diabetes mellitus with diabetic peripheral angiopathy without gangrene: Secondary | ICD-10-CM | POA: Diagnosis not present

## 2022-03-01 DIAGNOSIS — I5042 Chronic combined systolic (congestive) and diastolic (congestive) heart failure: Secondary | ICD-10-CM | POA: Diagnosis not present

## 2022-03-01 DIAGNOSIS — E1122 Type 2 diabetes mellitus with diabetic chronic kidney disease: Secondary | ICD-10-CM | POA: Diagnosis not present

## 2022-03-01 DIAGNOSIS — I132 Hypertensive heart and chronic kidney disease with heart failure and with stage 5 chronic kidney disease, or end stage renal disease: Secondary | ICD-10-CM | POA: Diagnosis not present

## 2022-03-01 DIAGNOSIS — I252 Old myocardial infarction: Secondary | ICD-10-CM | POA: Diagnosis not present

## 2022-03-01 DIAGNOSIS — E785 Hyperlipidemia, unspecified: Secondary | ICD-10-CM | POA: Diagnosis not present

## 2022-03-01 DIAGNOSIS — M1990 Unspecified osteoarthritis, unspecified site: Secondary | ICD-10-CM | POA: Diagnosis not present

## 2022-03-01 DIAGNOSIS — Z7982 Long term (current) use of aspirin: Secondary | ICD-10-CM | POA: Diagnosis not present

## 2022-03-01 DIAGNOSIS — I2510 Atherosclerotic heart disease of native coronary artery without angina pectoris: Secondary | ICD-10-CM | POA: Diagnosis not present

## 2022-03-01 DIAGNOSIS — Z9181 History of falling: Secondary | ICD-10-CM | POA: Diagnosis not present

## 2022-03-02 DIAGNOSIS — N2581 Secondary hyperparathyroidism of renal origin: Secondary | ICD-10-CM | POA: Diagnosis not present

## 2022-03-02 DIAGNOSIS — D689 Coagulation defect, unspecified: Secondary | ICD-10-CM | POA: Diagnosis not present

## 2022-03-02 DIAGNOSIS — E1129 Type 2 diabetes mellitus with other diabetic kidney complication: Secondary | ICD-10-CM | POA: Diagnosis not present

## 2022-03-02 DIAGNOSIS — N186 End stage renal disease: Secondary | ICD-10-CM | POA: Diagnosis not present

## 2022-03-02 DIAGNOSIS — R52 Pain, unspecified: Secondary | ICD-10-CM | POA: Diagnosis not present

## 2022-03-02 DIAGNOSIS — D631 Anemia in chronic kidney disease: Secondary | ICD-10-CM | POA: Diagnosis not present

## 2022-03-02 DIAGNOSIS — D509 Iron deficiency anemia, unspecified: Secondary | ICD-10-CM | POA: Diagnosis not present

## 2022-03-02 DIAGNOSIS — Z992 Dependence on renal dialysis: Secondary | ICD-10-CM | POA: Diagnosis not present

## 2022-03-07 DIAGNOSIS — N2581 Secondary hyperparathyroidism of renal origin: Secondary | ICD-10-CM | POA: Diagnosis not present

## 2022-03-07 DIAGNOSIS — D631 Anemia in chronic kidney disease: Secondary | ICD-10-CM | POA: Diagnosis not present

## 2022-03-07 DIAGNOSIS — N186 End stage renal disease: Secondary | ICD-10-CM | POA: Diagnosis not present

## 2022-03-07 DIAGNOSIS — E1129 Type 2 diabetes mellitus with other diabetic kidney complication: Secondary | ICD-10-CM | POA: Diagnosis not present

## 2022-03-07 DIAGNOSIS — D689 Coagulation defect, unspecified: Secondary | ICD-10-CM | POA: Diagnosis not present

## 2022-03-07 DIAGNOSIS — Z992 Dependence on renal dialysis: Secondary | ICD-10-CM | POA: Diagnosis not present

## 2022-03-07 DIAGNOSIS — D509 Iron deficiency anemia, unspecified: Secondary | ICD-10-CM | POA: Diagnosis not present

## 2022-03-07 DIAGNOSIS — R52 Pain, unspecified: Secondary | ICD-10-CM | POA: Diagnosis not present

## 2022-03-09 DIAGNOSIS — E1129 Type 2 diabetes mellitus with other diabetic kidney complication: Secondary | ICD-10-CM | POA: Diagnosis not present

## 2022-03-09 DIAGNOSIS — D631 Anemia in chronic kidney disease: Secondary | ICD-10-CM | POA: Diagnosis not present

## 2022-03-09 DIAGNOSIS — D509 Iron deficiency anemia, unspecified: Secondary | ICD-10-CM | POA: Diagnosis not present

## 2022-03-09 DIAGNOSIS — D689 Coagulation defect, unspecified: Secondary | ICD-10-CM | POA: Diagnosis not present

## 2022-03-09 DIAGNOSIS — N2581 Secondary hyperparathyroidism of renal origin: Secondary | ICD-10-CM | POA: Diagnosis not present

## 2022-03-09 DIAGNOSIS — R52 Pain, unspecified: Secondary | ICD-10-CM | POA: Diagnosis not present

## 2022-03-09 DIAGNOSIS — N186 End stage renal disease: Secondary | ICD-10-CM | POA: Diagnosis not present

## 2022-03-09 DIAGNOSIS — Z992 Dependence on renal dialysis: Secondary | ICD-10-CM | POA: Diagnosis not present

## 2022-03-12 DIAGNOSIS — D689 Coagulation defect, unspecified: Secondary | ICD-10-CM | POA: Diagnosis not present

## 2022-03-12 DIAGNOSIS — R52 Pain, unspecified: Secondary | ICD-10-CM | POA: Diagnosis not present

## 2022-03-12 DIAGNOSIS — D509 Iron deficiency anemia, unspecified: Secondary | ICD-10-CM | POA: Diagnosis not present

## 2022-03-12 DIAGNOSIS — N186 End stage renal disease: Secondary | ICD-10-CM | POA: Diagnosis not present

## 2022-03-12 DIAGNOSIS — E1129 Type 2 diabetes mellitus with other diabetic kidney complication: Secondary | ICD-10-CM | POA: Diagnosis not present

## 2022-03-12 DIAGNOSIS — D631 Anemia in chronic kidney disease: Secondary | ICD-10-CM | POA: Diagnosis not present

## 2022-03-12 DIAGNOSIS — Z992 Dependence on renal dialysis: Secondary | ICD-10-CM | POA: Diagnosis not present

## 2022-03-12 DIAGNOSIS — N2581 Secondary hyperparathyroidism of renal origin: Secondary | ICD-10-CM | POA: Diagnosis not present

## 2022-03-14 DIAGNOSIS — D631 Anemia in chronic kidney disease: Secondary | ICD-10-CM | POA: Diagnosis not present

## 2022-03-14 DIAGNOSIS — Z992 Dependence on renal dialysis: Secondary | ICD-10-CM | POA: Diagnosis not present

## 2022-03-14 DIAGNOSIS — D689 Coagulation defect, unspecified: Secondary | ICD-10-CM | POA: Diagnosis not present

## 2022-03-14 DIAGNOSIS — E1129 Type 2 diabetes mellitus with other diabetic kidney complication: Secondary | ICD-10-CM | POA: Diagnosis not present

## 2022-03-14 DIAGNOSIS — D509 Iron deficiency anemia, unspecified: Secondary | ICD-10-CM | POA: Diagnosis not present

## 2022-03-14 DIAGNOSIS — R52 Pain, unspecified: Secondary | ICD-10-CM | POA: Diagnosis not present

## 2022-03-14 DIAGNOSIS — N2581 Secondary hyperparathyroidism of renal origin: Secondary | ICD-10-CM | POA: Diagnosis not present

## 2022-03-14 DIAGNOSIS — N186 End stage renal disease: Secondary | ICD-10-CM | POA: Diagnosis not present

## 2022-03-16 DIAGNOSIS — D509 Iron deficiency anemia, unspecified: Secondary | ICD-10-CM | POA: Diagnosis not present

## 2022-03-16 DIAGNOSIS — E1129 Type 2 diabetes mellitus with other diabetic kidney complication: Secondary | ICD-10-CM | POA: Diagnosis not present

## 2022-03-16 DIAGNOSIS — R52 Pain, unspecified: Secondary | ICD-10-CM | POA: Diagnosis not present

## 2022-03-16 DIAGNOSIS — Z992 Dependence on renal dialysis: Secondary | ICD-10-CM | POA: Diagnosis not present

## 2022-03-16 DIAGNOSIS — N2581 Secondary hyperparathyroidism of renal origin: Secondary | ICD-10-CM | POA: Diagnosis not present

## 2022-03-16 DIAGNOSIS — D631 Anemia in chronic kidney disease: Secondary | ICD-10-CM | POA: Diagnosis not present

## 2022-03-16 DIAGNOSIS — N186 End stage renal disease: Secondary | ICD-10-CM | POA: Diagnosis not present

## 2022-03-16 DIAGNOSIS — D689 Coagulation defect, unspecified: Secondary | ICD-10-CM | POA: Diagnosis not present

## 2022-03-18 DIAGNOSIS — E1122 Type 2 diabetes mellitus with diabetic chronic kidney disease: Secondary | ICD-10-CM | POA: Diagnosis not present

## 2022-03-18 DIAGNOSIS — I5042 Chronic combined systolic (congestive) and diastolic (congestive) heart failure: Secondary | ICD-10-CM | POA: Diagnosis not present

## 2022-03-18 DIAGNOSIS — N184 Chronic kidney disease, stage 4 (severe): Secondary | ICD-10-CM | POA: Diagnosis not present

## 2022-03-18 DIAGNOSIS — I130 Hypertensive heart and chronic kidney disease with heart failure and stage 1 through stage 4 chronic kidney disease, or unspecified chronic kidney disease: Secondary | ICD-10-CM | POA: Diagnosis not present

## 2022-03-18 DIAGNOSIS — Z992 Dependence on renal dialysis: Secondary | ICD-10-CM | POA: Diagnosis not present

## 2022-03-18 DIAGNOSIS — N186 End stage renal disease: Secondary | ICD-10-CM | POA: Diagnosis not present

## 2022-03-18 DIAGNOSIS — E1129 Type 2 diabetes mellitus with other diabetic kidney complication: Secondary | ICD-10-CM | POA: Diagnosis not present

## 2022-03-19 DIAGNOSIS — Z7982 Long term (current) use of aspirin: Secondary | ICD-10-CM | POA: Diagnosis not present

## 2022-03-19 DIAGNOSIS — F32A Depression, unspecified: Secondary | ICD-10-CM | POA: Diagnosis not present

## 2022-03-19 DIAGNOSIS — E785 Hyperlipidemia, unspecified: Secondary | ICD-10-CM | POA: Diagnosis not present

## 2022-03-19 DIAGNOSIS — L97821 Non-pressure chronic ulcer of other part of left lower leg limited to breakdown of skin: Secondary | ICD-10-CM | POA: Diagnosis not present

## 2022-03-19 DIAGNOSIS — R52 Pain, unspecified: Secondary | ICD-10-CM | POA: Diagnosis not present

## 2022-03-19 DIAGNOSIS — Z7902 Long term (current) use of antithrombotics/antiplatelets: Secondary | ICD-10-CM | POA: Diagnosis not present

## 2022-03-19 DIAGNOSIS — Z4781 Encounter for orthopedic aftercare following surgical amputation: Secondary | ICD-10-CM | POA: Diagnosis not present

## 2022-03-19 DIAGNOSIS — Z87891 Personal history of nicotine dependence: Secondary | ICD-10-CM | POA: Diagnosis not present

## 2022-03-19 DIAGNOSIS — L299 Pruritus, unspecified: Secondary | ICD-10-CM | POA: Diagnosis not present

## 2022-03-19 DIAGNOSIS — I252 Old myocardial infarction: Secondary | ICD-10-CM | POA: Diagnosis not present

## 2022-03-19 DIAGNOSIS — Z89611 Acquired absence of right leg above knee: Secondary | ICD-10-CM | POA: Diagnosis not present

## 2022-03-19 DIAGNOSIS — I132 Hypertensive heart and chronic kidney disease with heart failure and with stage 5 chronic kidney disease, or end stage renal disease: Secondary | ICD-10-CM | POA: Diagnosis not present

## 2022-03-19 DIAGNOSIS — I5042 Chronic combined systolic (congestive) and diastolic (congestive) heart failure: Secondary | ICD-10-CM | POA: Diagnosis not present

## 2022-03-19 DIAGNOSIS — N2581 Secondary hyperparathyroidism of renal origin: Secondary | ICD-10-CM | POA: Diagnosis not present

## 2022-03-19 DIAGNOSIS — Z9181 History of falling: Secondary | ICD-10-CM | POA: Diagnosis not present

## 2022-03-19 DIAGNOSIS — E1151 Type 2 diabetes mellitus with diabetic peripheral angiopathy without gangrene: Secondary | ICD-10-CM | POA: Diagnosis not present

## 2022-03-19 DIAGNOSIS — D689 Coagulation defect, unspecified: Secondary | ICD-10-CM | POA: Diagnosis not present

## 2022-03-19 DIAGNOSIS — E1129 Type 2 diabetes mellitus with other diabetic kidney complication: Secondary | ICD-10-CM | POA: Diagnosis not present

## 2022-03-19 DIAGNOSIS — Z992 Dependence on renal dialysis: Secondary | ICD-10-CM | POA: Diagnosis not present

## 2022-03-19 DIAGNOSIS — N186 End stage renal disease: Secondary | ICD-10-CM | POA: Diagnosis not present

## 2022-03-19 DIAGNOSIS — D631 Anemia in chronic kidney disease: Secondary | ICD-10-CM | POA: Diagnosis not present

## 2022-03-19 DIAGNOSIS — M1990 Unspecified osteoarthritis, unspecified site: Secondary | ICD-10-CM | POA: Diagnosis not present

## 2022-03-19 DIAGNOSIS — E1122 Type 2 diabetes mellitus with diabetic chronic kidney disease: Secondary | ICD-10-CM | POA: Diagnosis not present

## 2022-03-19 DIAGNOSIS — Z89022 Acquired absence of left finger(s): Secondary | ICD-10-CM | POA: Diagnosis not present

## 2022-03-19 DIAGNOSIS — I2510 Atherosclerotic heart disease of native coronary artery without angina pectoris: Secondary | ICD-10-CM | POA: Diagnosis not present

## 2022-03-19 DIAGNOSIS — K219 Gastro-esophageal reflux disease without esophagitis: Secondary | ICD-10-CM | POA: Diagnosis not present

## 2022-03-19 DIAGNOSIS — G4730 Sleep apnea, unspecified: Secondary | ICD-10-CM | POA: Diagnosis not present

## 2022-03-21 DIAGNOSIS — E1129 Type 2 diabetes mellitus with other diabetic kidney complication: Secondary | ICD-10-CM | POA: Diagnosis not present

## 2022-03-21 DIAGNOSIS — N2581 Secondary hyperparathyroidism of renal origin: Secondary | ICD-10-CM | POA: Diagnosis not present

## 2022-03-21 DIAGNOSIS — R52 Pain, unspecified: Secondary | ICD-10-CM | POA: Diagnosis not present

## 2022-03-21 DIAGNOSIS — D689 Coagulation defect, unspecified: Secondary | ICD-10-CM | POA: Diagnosis not present

## 2022-03-21 DIAGNOSIS — N186 End stage renal disease: Secondary | ICD-10-CM | POA: Diagnosis not present

## 2022-03-21 DIAGNOSIS — Z992 Dependence on renal dialysis: Secondary | ICD-10-CM | POA: Diagnosis not present

## 2022-03-21 DIAGNOSIS — D631 Anemia in chronic kidney disease: Secondary | ICD-10-CM | POA: Diagnosis not present

## 2022-03-21 DIAGNOSIS — L299 Pruritus, unspecified: Secondary | ICD-10-CM | POA: Diagnosis not present

## 2022-03-23 DIAGNOSIS — N186 End stage renal disease: Secondary | ICD-10-CM | POA: Diagnosis not present

## 2022-03-23 DIAGNOSIS — Z992 Dependence on renal dialysis: Secondary | ICD-10-CM | POA: Diagnosis not present

## 2022-03-23 DIAGNOSIS — E1129 Type 2 diabetes mellitus with other diabetic kidney complication: Secondary | ICD-10-CM | POA: Diagnosis not present

## 2022-03-23 DIAGNOSIS — D631 Anemia in chronic kidney disease: Secondary | ICD-10-CM | POA: Diagnosis not present

## 2022-03-23 DIAGNOSIS — L299 Pruritus, unspecified: Secondary | ICD-10-CM | POA: Diagnosis not present

## 2022-03-23 DIAGNOSIS — R52 Pain, unspecified: Secondary | ICD-10-CM | POA: Diagnosis not present

## 2022-03-23 DIAGNOSIS — D689 Coagulation defect, unspecified: Secondary | ICD-10-CM | POA: Diagnosis not present

## 2022-03-23 DIAGNOSIS — N2581 Secondary hyperparathyroidism of renal origin: Secondary | ICD-10-CM | POA: Diagnosis not present

## 2022-03-26 ENCOUNTER — Other Ambulatory Visit: Payer: Self-pay | Admitting: Orthopedic Surgery

## 2022-03-26 DIAGNOSIS — E1129 Type 2 diabetes mellitus with other diabetic kidney complication: Secondary | ICD-10-CM | POA: Diagnosis not present

## 2022-03-26 DIAGNOSIS — L299 Pruritus, unspecified: Secondary | ICD-10-CM | POA: Diagnosis not present

## 2022-03-26 DIAGNOSIS — R52 Pain, unspecified: Secondary | ICD-10-CM | POA: Diagnosis not present

## 2022-03-26 DIAGNOSIS — N186 End stage renal disease: Secondary | ICD-10-CM | POA: Diagnosis not present

## 2022-03-26 DIAGNOSIS — D631 Anemia in chronic kidney disease: Secondary | ICD-10-CM | POA: Diagnosis not present

## 2022-03-26 DIAGNOSIS — I96 Gangrene, not elsewhere classified: Secondary | ICD-10-CM | POA: Diagnosis not present

## 2022-03-26 DIAGNOSIS — N2581 Secondary hyperparathyroidism of renal origin: Secondary | ICD-10-CM | POA: Diagnosis not present

## 2022-03-26 DIAGNOSIS — D689 Coagulation defect, unspecified: Secondary | ICD-10-CM | POA: Diagnosis not present

## 2022-03-26 DIAGNOSIS — Z992 Dependence on renal dialysis: Secondary | ICD-10-CM | POA: Diagnosis not present

## 2022-03-26 DIAGNOSIS — I998 Other disorder of circulatory system: Secondary | ICD-10-CM | POA: Diagnosis not present

## 2022-03-27 ENCOUNTER — Telehealth: Payer: Self-pay | Admitting: Cardiology

## 2022-03-27 NOTE — Telephone Encounter (Signed)
Pt agreeable to sooner I person appt for pre op clearance. Pt opts to not cancel Dr. Stanford Breed appt in 04/2022.03/29/22 @ 8:50 at [facility] location with Jory Sims, [place].

## 2022-03-27 NOTE — Telephone Encounter (Signed)
   Pre-operative Risk Assessment    Patient Name: Edward [name]  DOB: [birth year] MRN: [id number]      Request for Surgical Clearance    Procedure:  Right Index Finger Amputation at MP Joint also Right Ring Finger Amputation  Date of Surgery:  Clearance  04-01-22                                 Surgeon:  Dr Leanora Cover Surgeon's Group or Practice Name:   Phone number:  [phone] Fax number:  [phone]   Type of Clearance Requested:   - Medical  - Pharmacy:  Hold Aspirin and any blood thinner if he taking any   Type of Anesthesia:   Choice   Additional requests/questions:    Lorin Glass   03/27/2022, 8:04 AM

## 2022-03-27 NOTE — Telephone Encounter (Signed)
Primary Cardiologist:Edward Stanford Breed, MD  Chart reviewed as part of pre-operative protocol coverage. Because of Edward [name]'s past medical history and time since last visit, he/she will require a follow-up visit in order to better assess preoperative cardiovascular risk.  Pre-op covering staff: - Please schedule appointment and call patient to inform them. - Please contact requesting surgeon's office via preferred method (i.e, phone, fax) to inform them of need for appointment prior to surgery.  **See notes from clearance request 11/15/21. Patient was advised by Dr. Stanford [name] to have a stress test prior to obtaining cardiac clearance, this was never completed. He was cleared for urgent surgery. We have not seen him since that time. Will need to address request to hold antiplatelet medication with provider who sees patient.   Edward Life, NP-C    03/27/2022, 10:57 AM [place] [address] Office [phone] Fax [phone]

## 2022-03-27 NOTE — Progress Notes (Signed)
Cardiology Clinic Note   Patient Name: Edward [name] Date of Encounter: 03/29/2022  Primary Care Provider:  [name], MD Primary Cardiologist:  Kirk Ruths, MD  Patient Profile    51 year old male with CAD with prior PCI (LCX, OM, LAD) HFrEF 40-45%, mild AI, Severe MR and TR, Prior TIA, IDDM, ESRD, PAD with prior BKA, hx of Mobitz I HB. Other history includes HFrEF with EF of 40%, prior TIA, IDDM, ESRD, hyperkalemia.   He has been seen on consultation in the past by Dr. Dimas Chyle for pre-operative consultation in February 2023 and was deemed high risk of post MI but that he had "limited option for mitigation (in the setting of conduction disease, lexi scan is sub-optimal, he cannot walk, he prior stent size of 2.5 mm precluded CCTA, and his conduction disease and  orthostatic hypotension with HD limits medical management).  He has had a left index finger amputation and ring finger amputation, right below the knee amputation by Dr. Donzetta Matters due to necrotic right leg, 01/08/2022.    Past Medical History    Past Medical History:  Diagnosis Date   Allergy    Anemia    getting Venofer with dialysis   Anxiety    self reported, sometimes-situational anxiety   Arthritis    bilateral hands/LEFT hip   CHF (congestive heart failure) (HCC)    Chronic kidney disease    t th s dialysis- [place] ---ESRD   Complication of anesthesia    slow to go to sleep   Coronary artery disease    Depression    self reported, sometimes-situational depression   Diabetes mellitus    type II- on meds   Dyspnea    sometimes- fluid   GERD (gastroesophageal reflux disease)    on meds   Hypertension    no longer on medication since starting on Hemodialysis   Myocardial infarction [facility]) 2019   Sleep apnea    Past Surgical History:  Procedure Laterality Date   A/V FISTULAGRAM Left 03/06/2020   Procedure: A/V FISTULAGRAM;  Surgeon: Waynetta Sandy, MD;  Location: [place] [facility];  Service:  Cardiovascular;  Laterality: Left;   ABDOMINAL AORTOGRAM N/A 01/07/2022   Procedure: ABDOMINAL AORTOGRAM;  Surgeon: Waynetta Sandy, MD;  Location: [place] [facility];  Service: Cardiovascular;  Laterality: N/A;   ABDOMINAL AORTOGRAM W/LOWER EXTREMITY N/A 04/09/2021   Procedure: ABDOMINAL AORTOGRAM W/LOWER EXTREMITY;  Surgeon: Waynetta Sandy, MD;  Location: [place] [facility];  Service: Cardiovascular;  Laterality: N/A;   AMPUTATION Left 05/10/2016   Procedure: Left Transmetatarsal Amputation;  Surgeon: [name], MD;  Location: [place];  Service: Orthopedics;  Laterality: Left;   AMPUTATION Right 05/16/2018   Procedure: PARTIAL RAY AMPUTATION FIFTH TOE RIGHT FOOT;  Surgeon: [name], DPM;  Location: [place];  Service: Podiatry;  Laterality: Right;   AMPUTATION Right 06/17/2018   Procedure: AMPUTATION 4th RAY Right Foot;  Surgeon: Trula Slade, DPM;  Location: [place];  Service: Podiatry;  Laterality: Right;   AMPUTATION Right 06/19/2018   Procedure: RIGHT BELOW KNEE AMPUTATION;  Surgeon: [name], MD;  Location: [place];  Service: Orthopedics;  Laterality: Right;   AMPUTATION Left 10/05/2021   Procedure: LEFT RING FINGER AMPUTATION;  Surgeon: Daryll Brod, MD;  Location: [place];  Service: Orthopedics;  Laterality: Left;  45 MIN   AMPUTATION Left 11/15/2021   Procedure: LEFT INDEX FINGER AMPUTATION;  Surgeon: Leanora Cover, MD;  Location: [place];  Service: Orthopedics;  Laterality: Left;  60 MIN   AMPUTATION Right 01/08/2022   Procedure: AMPUTATION ABOVE KNEE;  Surgeon: Waynetta Sandy, MD;  Location: [place];  Service: Vascular;  Laterality: Right;   AMPUTATION Bilateral 01/12/2022   Procedure: LEFT INDEX FINGER AMPUTATION, RIGHT INDEX, LONG AND SMALL DIGITS AMPUTATION;  Surgeon: Leanora Cover, MD;  Location: [place];  Service: Orthopedics;  Laterality: Bilateral;   AORTIC ARCH ANGIOGRAPHY N/A 01/07/2022   Procedure: AORTIC ARCH ANGIOGRAPHY;  Surgeon: Waynetta Sandy, MD;  Location: [place] [facility];  Service: Cardiovascular;  Laterality: N/A;   APPLICATION OF WOUND VAC Right 06/19/2018   Procedure: APPLICATION OF WOUND VAC;  Surgeon: [name], MD;  Location: [place];  Service: Orthopedics;  Laterality: Right;   AV FISTULA PLACEMENT Left 11/25/2019   Procedure: LEFT ARM Brachiocephalic  ARTERIOVENOUS (AV) FISTULA CREATION;  Surgeon: Rosetta Posner, MD;  Location: [place];  Service: Vascular;  Laterality: Left;   [place] Left 07/19/2020   Procedure: LEFT SECOND STAGE [place];  Surgeon: Waynetta Sandy, MD;  Location: [place];  Service: Vascular;  Laterality: Left;   CARDIAC CATHETERIZATION  07/29/2018   CIRCUMCISION  09/02/2011   Procedure: CIRCUMCISION ADULT;  Surgeon: Molli Hazard, MD;  Location: [place];  Service: Urology;  Laterality: N/A;   CORONARY STENT INTERVENTION N/A 07/29/2018   Procedure: CORONARY STENT INTERVENTION;  Surgeon: Leonie Man, MD;  Location: [place] [facility];  Service: Cardiovascular;  Laterality: N/A;   DEBRIDEMENT AND CLOSURE WOUND Left 11/15/2021   Procedure: LEFT HAND WOUND CLOSURE;  Surgeon: Leanora Cover, MD;  Location: [place];  Service: Orthopedics;  Laterality: Left;   I & D EXTREMITY  09/02/2011   Procedure: IRRIGATION AND DEBRIDEMENT EXTREMITY;  Surgeon: Theodoro Kos, DO;  Location: [place];  Service: Plastics;  Laterality: N/A;   INCISION AND DRAINAGE OF WOUND  08/12/2011   Procedure: IRRIGATION AND DEBRIDEMENT WOUND;  Surgeon: Zenovia Jarred, MD;  Location: [place];  Service: General;;  perineum   INSERTION OF DIALYSIS CATHETER N/A 11/25/2019   Procedure: INSERTION OF [place] Internal Jugular DIALYSIS CATHETER;  Surgeon: Rosetta Posner, MD;  Location: [place];  Service: Vascular;  Laterality: N/A;   [place]  08/12/2011   Procedure: IRRIGATION AND DEBRIDEMENT ABSCESS;  Surgeon: Molli Hazard,  MD;  Location: [place];  Service: Urology;  Laterality: N/A;  irrigation and debridment perineum     IRRIGATION AND DEBRIDEMENT ABSCESS  08/14/2011   Procedure: MINOR INCISION AND DRAINAGE OF ABSCESS;  Surgeon: Molli Hazard, MD;  Location: [place];  Service: Urology;  Laterality: N/A;  Perineal Wound Debridement;Placement Bilateral Testicular Thigh Pouches   LEFT HEART CATH AND CORONARY ANGIOGRAPHY N/A 07/29/2018   Procedure: LEFT HEART CATH AND CORONARY ANGIOGRAPHY;  Surgeon: Leonie Man, MD;  Location: [place] [facility];  Service: Cardiovascular;  Laterality: N/A;   LOWER EXTREMITY ANGIOGRAPHY Bilateral 01/07/2022   Procedure: Lower Extremity Angiography;  Surgeon: Waynetta Sandy, MD;  Location: [place] [facility];  Service: Cardiovascular;  Laterality: Bilateral;   PERIPHERAL VASCULAR ATHERECTOMY Left 04/09/2021   Procedure: PERIPHERAL VASCULAR ATHERECTOMY;  Surgeon: Waynetta Sandy, MD;  Location: [place] [facility];  Service: Cardiovascular;  Laterality: Left;  TP trunk and peroneal arteries   PERIPHERAL VASCULAR BALLOON ANGIOPLASTY Left 04/09/2021   Procedure: PERIPHERAL VASCULAR BALLOON ANGIOPLASTY;  Surgeon: Waynetta Sandy, MD;  Location: [place]  [facility];  Service: Cardiovascular;  Laterality: Left;  Popliteal   REVISON OF ARTERIOVENOUS FISTULA Left 05/24/2020   Procedure: LEFT ARM ARTERIOVENOUS FISTULA  CONVERSION TO BASILIC VEIN FISTULA;  Surgeon: Waynetta Sandy, MD;  Location: [place];  Service: Vascular;  Laterality: Left;   TOTAL HIP ARTHROPLASTY Left 10/11/2016   Procedure: LEFT TOTAL HIP ARTHROPLASTY ANTERIOR APPROACH;  Surgeon: Mcarthur Rossetti, MD;  Location: [facility];  Service: Orthopedics;  Laterality: Left;   UPPER EXTREMITY ANGIOGRAPHY Bilateral 01/07/2022   Procedure: Upper Extremity Angiography;  Surgeon: Waynetta Sandy, MD;  Location: [place] [facility];  Service: Cardiovascular;  Laterality: Bilateral;     Allergies  Allergies  Allergen Reactions   Crestor [Rosuvastatin] Rash    Also had a red eye   Dilaudid [Hydromorphone] Itching   Benadryl [Diphenhydramine] Rash    History of Present Illness    51 year old patient who is here for pre-operative cardiac clearance for right index finger amputation at MP joint and also right ring finger by Dr. Ruthell Rummage on April 01, 2022.  This is due to circulatory insufficiency and need for removal to prevent gangrene.  The patient is on Plavix 75 mg daily.  The patient is unhappy but willing to have this procedure completed.  He is feeling bad that he is losing fingers and legs as a result of his circulatory condition.  Dr. Gwenlyn Saran has been very thorough concerning his evaluation for circulatory issues, as well as interventions to those areas that were necessary to have reperfusion.  The patient continues dialysis, is medically compliant, and denies any new cardiac symptoms of chest pain, shortness of breath, dizziness, PND, or rapid heart rhythm.   Home Medications    Current Outpatient Medications  Medication Sig Dispense Refill   acetaminophen (TYLENOL) 500 MG tablet Take 500-1,000 mg by mouth every 6 (six) hours as needed for moderate pain or headache.     allopurinol (ZYLOPRIM) 100 MG tablet Take 100 mg by mouth daily.     aspirin 81 MG chewable tablet Chew 81 mg by mouth daily.     atorvastatin (LIPITOR) 80 MG tablet Take 80 mg by mouth in the morning.     bismuth subsalicylate (PEPTO BISMOL) 262 MG/15ML suspension Take 30 mLs by mouth every 6 (six) hours as needed for indigestion.     blood glucose meter kit and supplies Dispense based on patient and insurance preference. Use up to four times daily as directed. (FOR ICD-9 250.00, 250.01). 1 each 0   clopidogrel (PLAVIX) 75 MG tablet TAKE ONE TABLET BY MOUTH ONCE DAILY 30 tablet 11   ezetimibe (ZETIA) 10 MG tablet Take 1 tablet (10 mg total) by mouth daily. 30 tablet 3   gabapentin (NEURONTIN)  100 MG capsule Take 100 mg by mouth 3 (three) times daily.     insulin aspart protamine - aspart (NOVOLOG MIX 70/30 FLEXPEN) (70-30) 100 UNIT/ML FlexPen Inject 0.3 mLs (30 Units total) into the skin 2 (two) times daily with a meal. 15 mL 0   Insulin Pen Needle 31G X 5 MM MISC Use daily to inject insulin as instructed 100 each 2   linaclotide (LINZESS) 72 MCG capsule Take 1 capsule (72 mcg total) by mouth daily before breakfast. (Patient taking differently: Take 72 mcg by mouth in the morning.) 8 capsule 0   pantoprazole (PROTONIX) 40 MG tablet TAKE ONE TABLET BY MOUTH EVERY MORNING and TAKE ONE TABLET BY MOUTH EVERYDAY AT BEDTIME (Patient taking differently: Take  40 mg by mouth daily.) 180 tablet 1   traMADol (ULTRAM) 50 MG tablet Take 50 mg by mouth every 6 (six) hours as needed for pain.     triamcinolone ointment (KENALOG) 0.1 % 1 application. daily as needed (rash).     VELPHORO 500 MG chewable tablet Chew 1,000 mg by mouth 3 (three) times daily with meals.     No current facility-administered medications for this visit.   Facility-Administered Medications Ordered in Other Visits  Medication Dose Route Frequency Provider Last Rate Last Admin   alteplase (CATHFLO ACTIVASE) injection 2 mg  2 mg Intracatheter Once PRN Ernest Haber, PA-C       anticoagulant sodium citrate solution 5 mL  5 mL Dialysis PRN Ernest Haber, PA-C       heparin injection 1,000 Units  1,000 Units Intracatheter PRN Ernest Haber, PA-C       heparin injection 5,000 Units  5,000 Units Dialysis PRN Ernest Haber, PA-C       lidocaine (PF) (XYLOCAINE) 1 % injection 5 mL  5 mL Intradermal PRN Zeyfang, David, PA-C       lidocaine-prilocaine (EMLA) cream 1 Application  1 application  Topical PRN Zeyfang, David, PA-C       pentafluoroprop-tetrafluoroeth (GEBAUERS) aerosol 1 Application  1 application  Topical PRN Ernest Haber, PA-C         Family History    Family History  Problem Relation Age of Onset   Diabetes  Other    Pancreatic cancer Mother 69   Colon cancer Neg Hx    Colon polyps Neg Hx    Esophageal cancer Neg Hx    Rectal cancer Neg Hx    Stomach cancer Neg Hx    He indicated that his mother is deceased. He indicated that his father is alive. He indicated that his maternal grandmother is deceased. He indicated that his maternal grandfather is deceased. He indicated that his paternal grandmother is deceased. He indicated that his paternal grandfather is deceased. He indicated that the status of his neg hx is unknown. He indicated that the status of his other is unknown.  Social History    Social History   Socioeconomic History   Marital status: Widowed    Spouse name: Not on file   Number of children: Not on file   Years of education: Not on file   Highest education level: Not on file  Occupational History   Occupation: Disabled  Tobacco Use   Smoking status: Former    Packs/day: 0.25    Years: 24.00    Total pack years: 6.00    Types: Cigarettes    Quit date: 11/19/2019    Years since quitting: 2.3   Smokeless tobacco: Never  Vaping Use   Vaping Use: Never used  Substance and Sexual Activity   Alcohol use: Not Currently    Alcohol/week: 0.0 standard drinks of alcohol    Comment: quit 2018   Drug use: No   Sexual activity: Not Currently    Birth control/protection: Condom  Other Topics Concern   Not on file  Social History Narrative   Lives w/ his 2 sons.   Social Determinants of Health   Financial Resource Strain: Not on file  Food Insecurity: Not on file  Transportation Needs: Not on file  Physical Activity: Not on file  Stress: Not on file  Social Connections: Not on file  Intimate Partner Violence: Not on file     Review of Systems  General:  No chills, fever, night sweats or weight changes.  Cardiovascular:  No chest pain, dyspnea on exertion, edema, orthopnea, palpitations, paroxysmal nocturnal dyspnea. Dermatological: No rash,  lesions/masses Respiratory: No cough, dyspnea Urologic: No hematuria, dysuria Abdominal:   No nausea, vomiting, diarrhea, bright red blood per rectum, melena, or hematemesis Neurologic:  No visual changes, wkns, changes in mental status. All other systems reviewed and are otherwise negative except as noted above.     Physical Exam    VS:  BP 126/82   Pulse 82   Ht 6' 1" (1.854 m)   SpO2 97%   BMI 28.37 kg/m  , BMI Body mass index is 28.37 kg/m.     GEN: Well nourished, well developed, in no acute distress. HEENT: normal. Neck: Supple, no JVD, carotid bruits, or masses. Cardiac: RRR, soft systolic murmurs, heard best at the left sternal border rubs, or gallops. No clubbing, cyanosis, edema.  Radials/DP/PT 2+ diminished to absent in the distal pulses of the DP and PT  Respiratory:  Respirations regular and unlabored, clear to auscultation bilaterally. GI: Soft, nontender, nondistended, BS + x 4. MS: Right AKA, amputated fifth, fourth, third, fingers of the right hand, amputations also noted on the left hand, ring finger and index finger. Skin: warm and dry, no rash.  Skin darkening distal portion of hands bilaterally and remaining fingers.  Neuro:  Strength and sensation are intact. Psych: Normal affect.  Accessory Clinical Findings    ECG personally reviewed by me today-sinus rhythm with first-degree AV block, left anterior fascicular block with septal infarct, heart rate of 82 bpm, PR interval 1.286 ms- No acute changes when compared to 10/04/2021.  Lab Results  Component Value Date   WBC 10.4 01/13/2022   HGB 8.5 (L) 01/13/2022   HCT 26.2 (L) 01/13/2022   MCV 98.5 01/13/2022   PLT 297 01/13/2022   Lab Results  Component Value Date   CREATININE 6.62 (H) 01/14/2022   BUN 45 (H) 01/14/2022   NA 133 (L) 01/14/2022   K 4.4 01/14/2022   CL 94 (L) 01/14/2022   CO2 26 01/14/2022   Lab Results  Component Value Date   ALT 12 01/13/2022   AST 31 01/13/2022   ALKPHOS 108  01/13/2022   BILITOT 1.3 (H) 01/13/2022   Lab Results  Component Value Date   CHOL 165 01/08/2022   HDL 31 (L) 01/08/2022   LDLCALC 109 (H) 01/08/2022   TRIG 124 01/08/2022   CHOLHDL 5.3 01/08/2022    Lab Results  Component Value Date   HGBA1C 6.5 (H) 01/07/2022    Review of Prior Studies Vascular Ultrasound of the right extremity 12/26/2021    Right: No color flow or Doppler signal in the distal radial artery         consistent with occlusion / near occlusion, limited         visualization.   Aortic Arch Angiography 01/07/2022  The arch of his aorta appears to be a type I arch.  The right upper extremity there is a small lesion in the brachial artery just after the takeoff of a high radial artery.  He does not have any ulnar artery flow to his right hand that is appreciable.  The radial artery has very sluggish flow down to the level of the wrist could not get any flow into the hand.  The interosseous artery flows toe about the level of the wrist and then stops.  No intervention was undertaken there.  On the  left side there is no flow-limiting stenosis throughout the upper extremity he does have radial artery dominance which goes to the level of the wrist at least but is difficult given the fistula.  If there is persistent wounds of the left hand we may need to consider ligation of the left arm fistula given that I do not think he would be a good candidate for any extensive revascularization of his left upper extremity.  The aorta in the abdomen is free of flow-limiting stenosis.  The SMA is patent.  Bilateral renal arteries consistent with dialysis.  Bilateral common iliac arteries are patent and there is disease in the bilateral hypogastric arteries but they are both patent.  On the right side the common femoral artery is patent with profunda flow in the SFA is patent down to the level of above the knee where there is no flow into the below-knee area consistent with necrosis of the  below-knee amputation.  On the left side the common femoral and SFA are patent.  There is a 50% stenosis of the popliteal artery and then appears to have dominant flow via the peroneal artery to a transmetatarsal amputation on the left.  No intervention was undertaken.  Echocardiogram 09/27/2020  1. Abnormal septal motion Distal septal / apical hypokinesis . Left  ventricular ejection fraction, by estimation, is 50 to 55%. The left  ventricle has low normal function. The left ventricle demonstrates  regional wall motion abnormalities (see scoring  diagram/findings for description). The left ventricular internal cavity  size was mildly dilated. There is mild left ventricular hypertrophy. Left  ventricular diastolic parameters are consistent with Grade II diastolic  dysfunction (pseudonormalization).  Elevated left ventricular end-diastolic pressure.   2. Right ventricular systolic function is normal. The right ventricular  size is normal. There is moderately elevated pulmonary artery systolic  pressure.   3. Left atrial size was moderately dilated.   4. The pericardial effusion is posterior to the left ventricle.   5. Carpentier class 3B restricted posterior leaflet motion . The mitral  valve is abnormal. Moderate to severe mitral valve regurgitation. No  evidence of mitral stenosis.   6. The aortic valve is tricuspid. Aortic valve regurgitation is mild. No  aortic stenosis is present.   7. The inferior vena cava is normal in size with greater than 50%  respiratory variability, suggesting right atrial pressure of 3 mmHg.    Dobutamine Stress Test 09/19/2020 1. This is a negative stress echocardiogram for ischemia.   2. This is a low risk study.   Assessment & Plan   1.  Pre-Op Cardiac Evaluation: Able to complete >4.0 METS. Per Revised Cardiac Risk Index, consider high risk with >11% chance of adverse cardiac events perioperatively given history of CAD, CHF, and stroke. However, he is well  optimized from a cardiac standpoint. Patient is aware that he is at elevated risk and accepts this. Therefore, based on ACC/AHA guidelines, patient would be at acceptable risk for the planned procedure without further cardiovascular testing. He is to hold Plavix for 5 days prior to surgery. He will stop Plavix today and start again ASAP after amputation.  2. Coronary Artery Disease: PCI of left circumflex OM, LAD, 2019.  The patient remained on Plavix 75 mg daily which will be held temporarily for surgery.  He offers no complaints of chest discomfort, dizziness, or shortness of breath.  He will continue secondary risk management with blood pressure control, volume management, and cholesterol management.  3.  Hypercholesterolemia:  As he is high risk patient, recommend LDL less than 50.  He is on high intensity atorvastatin 80 mg daily, and Zetia.  Most recent LDL calculated at 109 on 01/08/2022.  May need to consider him for Repatha on follow-up.  He states he is compliant with his medication regimen.  4.  End-stage renal disease: Dialysis on Tuesdays Thursdays and Saturdays.  Volume management per dialysis team.  5.  Chronic diastolic heart failure: Grade 2 per echocardiogram, 09/27/2020.  Volume management through dialysis.  6.  Peripheral arterial disease: Frequent interventions per Dr. Donzetta Matters, with amputations per Dr. Fredna Dow in the setting of significant PAD.  Please review thorough notes from those physicians.  He is cleared to have his amputation right index finger, on his right hand as planned by Dr. Fredna Dow as above.  Continue follow-up with Dr. Donzetta Matters and Dr. Fredna Dow.   7.  Hypertension: Currently well controlled.  I would not change any medications at this time.  8.  Insulin-dependent diabetes: Followed by primary care.  Most recent hemoglobin A1c 6.5 on 01/07/2022.   Current medicines are reviewed at length with the patient today.  I have spent 45 min's  dedicated to the care of this patient on the  date of this encounter to include pre-visit review of records, assessment, management and diagnostic testing,with shared decision making. Signed, Phill Myron. West Pugh, ANP, AACC   03/29/2022 9:54 AM      Office [phone] Fax [phone]  Notice: This dictation was prepared with Dragon dictation along with smaller phrase technology. Any transcriptional errors that result from this process are unintentional and may not be corrected upon review.

## 2022-03-29 ENCOUNTER — Other Ambulatory Visit: Payer: Self-pay

## 2022-03-29 ENCOUNTER — Ambulatory Visit (INDEPENDENT_AMBULATORY_CARE_PROVIDER_SITE_OTHER): Payer: Medicare Other | Admitting: Adult Health

## 2022-03-29 ENCOUNTER — Encounter (HOSPITAL_COMMUNITY): Payer: Self-pay | Admitting: Orthopedic Surgery

## 2022-03-29 ENCOUNTER — Encounter: Payer: Self-pay | Admitting: Adult Health

## 2022-03-29 VITALS — BP 126/82 | HR 82 | Ht 73.0 in

## 2022-03-29 DIAGNOSIS — I358 Other nonrheumatic aortic valve disorders: Secondary | ICD-10-CM | POA: Diagnosis not present

## 2022-03-29 DIAGNOSIS — I5032 Chronic diastolic (congestive) heart failure: Secondary | ICD-10-CM

## 2022-03-29 DIAGNOSIS — N186 End stage renal disease: Secondary | ICD-10-CM

## 2022-03-29 DIAGNOSIS — I739 Peripheral vascular disease, unspecified: Secondary | ICD-10-CM

## 2022-03-29 DIAGNOSIS — I2510 Atherosclerotic heart disease of native coronary artery without angina pectoris: Secondary | ICD-10-CM | POA: Diagnosis not present

## 2022-03-29 DIAGNOSIS — I10 Essential (primary) hypertension: Secondary | ICD-10-CM

## 2022-03-29 DIAGNOSIS — Z992 Dependence on renal dialysis: Secondary | ICD-10-CM | POA: Diagnosis not present

## 2022-03-29 DIAGNOSIS — Z01818 Encounter for other preprocedural examination: Secondary | ICD-10-CM

## 2022-03-29 DIAGNOSIS — E7800 Pure hypercholesterolemia, unspecified: Secondary | ICD-10-CM

## 2022-03-29 DIAGNOSIS — Z955 Presence of coronary angioplasty implant and graft: Secondary | ICD-10-CM | POA: Diagnosis not present

## 2022-03-29 MED ORDER — PENTAFLUOROPROP-TETRAFLUOROETH EX AERO: 1.0000 | INHALATION_SPRAY | CUTANEOUS | Status: DC | PRN

## 2022-03-29 MED ORDER — LIDOCAINE-PRILOCAINE 2.5-2.5 % EX CREA: 1.0000 | TOPICAL_CREAM | CUTANEOUS | Status: DC | PRN

## 2022-03-29 MED ORDER — ALTEPLASE 2 MG IJ SOLR
2.0000 mg | Freq: Once | INTRAMUSCULAR | Status: DC | PRN
  Filled 2022-03-29: qty 2

## 2022-03-29 MED ORDER — LIDOCAINE HCL (PF) 1 % IJ SOLN
5.0000 mL | INTRAMUSCULAR | Status: DC | PRN
  Filled 2022-03-29: qty 5

## 2022-03-29 MED ORDER — ANTICOAGULANT SODIUM CITRATE 4% (200MG/5ML) IV SOLN
5.0000 mL | Status: DC | PRN
  Filled 2022-03-29: qty 5

## 2022-03-29 MED ORDER — HEPARIN SODIUM (PORCINE) 1000 UNIT/ML DIALYSIS
1000.0000 [IU] | INTRAMUSCULAR | Status: DC | PRN
  Filled 2022-03-29: qty 1

## 2022-03-29 MED ORDER — HEPARIN SODIUM (PORCINE) 1000 UNIT/ML DIALYSIS
5000.0000 [IU] | INTRAMUSCULAR | Status: DC | PRN
Start: 2022-03-29 — End: 2022-04-05
  Filled 2022-03-29: qty 5

## 2022-03-29 NOTE — Progress Notes (Signed)
Mr Edward [name] was cleared by cardiologist today at [facility].Patient was given instructions to hold Plavix- up to 5 days, hold ASA 24 hours prior to the day of surgery. [name] Cardiologist is dr Stanford Breed. Patient's PCP is Dr. [name].  I spoke with [name] who denies chest pain or shortness of breath.Patient denies having any s/s of Covid in her household, also denies any known exposure to Covid.   [name]  has type II diabetes, patient reports that CBGs run 52-115.  I instructed [name] to take 21 units of 70/30 Insulin on Sunday evening and no Insulin on Monday am.   I instructed [name] to check CBG after awaking and every 2 hours until arrival  to the hospital.  I Instructed patient if CBG is less than 70 to take 4 Glucose Tablets or 1 tube of Glucose Gel or 1/2 cup of a clear juice. Recheck CBG in 15 minutes if CBG is not over 70 call, pre- op desk at [phone] for further instructions.

## 2022-03-29 NOTE — Patient Instructions (Signed)
Medication Instructions:  No Changes *If you need a refill on your cardiac medications before your next appointment, please call your pharmacy*   Lab Work: No Labs If you have labs (blood work) drawn today and your tests are completely normal, you will receive your results only by: [place] (if you have MyChart) OR A paper copy in the mail If you have any lab test that is abnormal or we need to change your treatment, we will call you to review the results.   Testing/Procedures: No Testing   Follow-Up: At [facility], you and your health needs are our priority.  As part of our continuing mission to provide you with exceptional heart care, we have created designated Provider Care Teams.  These Care Teams include your primary Cardiologist (physician) and Advanced Practice Providers (APPs -  Physician Assistants and Nurse Practitioners) who all work together to provide you with the care you need, when you need it.  We recommend signing up for the patient portal called "MyChart".  Sign up information is provided on this After Visit Summary.  MyChart is used to connect with patients for Virtual Visits (Telemedicine).  Patients are able to view lab/test results, encounter notes, upcoming appointments, etc.  Non-urgent messages can be sent to your provider as well.   To learn more about what you can do with MyChart, go to [email or web address].    Your next appointment:   Keep Scheduled Appointment  The format for your next appointment:   In Person  Provider:   Kirk Ruths, MD

## 2022-03-30 DIAGNOSIS — R52 Pain, unspecified: Secondary | ICD-10-CM | POA: Diagnosis not present

## 2022-03-30 DIAGNOSIS — N186 End stage renal disease: Secondary | ICD-10-CM | POA: Diagnosis not present

## 2022-03-30 DIAGNOSIS — D689 Coagulation defect, unspecified: Secondary | ICD-10-CM | POA: Diagnosis not present

## 2022-03-30 DIAGNOSIS — Z992 Dependence on renal dialysis: Secondary | ICD-10-CM | POA: Diagnosis not present

## 2022-03-30 DIAGNOSIS — N2581 Secondary hyperparathyroidism of renal origin: Secondary | ICD-10-CM | POA: Diagnosis not present

## 2022-03-30 DIAGNOSIS — D631 Anemia in chronic kidney disease: Secondary | ICD-10-CM | POA: Diagnosis not present

## 2022-03-30 DIAGNOSIS — L299 Pruritus, unspecified: Secondary | ICD-10-CM | POA: Diagnosis not present

## 2022-03-30 DIAGNOSIS — E1129 Type 2 diabetes mellitus with other diabetic kidney complication: Secondary | ICD-10-CM | POA: Diagnosis not present

## 2022-04-01 ENCOUNTER — Ambulatory Visit (HOSPITAL_COMMUNITY): Payer: Medicare Other | Admitting: Anesthesiology

## 2022-04-01 ENCOUNTER — Other Ambulatory Visit: Payer: Self-pay

## 2022-04-01 ENCOUNTER — Encounter (HOSPITAL_COMMUNITY)
Admission: AD | Disposition: A | Discharge status: Home Health | Payer: Self-pay | Source: Ambulatory Visit | Attending: Internal Medicine

## 2022-04-01 ENCOUNTER — Inpatient Hospital Stay (HOSPITAL_COMMUNITY)
Admission: AD | Admit: 2022-04-01 | Discharge: 2022-04-05 | DRG: 513 | Disposition: A | Discharge status: Home Health | Payer: Medicare Other | Source: Ambulatory Visit | Attending: Internal Medicine | Admitting: Internal Medicine

## 2022-04-01 DIAGNOSIS — E785 Hyperlipidemia, unspecified: Secondary | ICD-10-CM | POA: Diagnosis present

## 2022-04-01 DIAGNOSIS — Z955 Presence of coronary angioplasty implant and graft: Secondary | ICD-10-CM

## 2022-04-01 DIAGNOSIS — Z833 Family history of diabetes mellitus: Secondary | ICD-10-CM

## 2022-04-01 DIAGNOSIS — I5042 Chronic combined systolic (congestive) and diastolic (congestive) heart failure: Secondary | ICD-10-CM | POA: Diagnosis not present

## 2022-04-01 DIAGNOSIS — Z89022 Acquired absence of left finger(s): Secondary | ICD-10-CM

## 2022-04-01 DIAGNOSIS — T8131XA Disruption of external operation (surgical) wound, not elsewhere classified, initial encounter: Secondary | ICD-10-CM | POA: Diagnosis present

## 2022-04-01 DIAGNOSIS — K219 Gastro-esophageal reflux disease without esophagitis: Secondary | ICD-10-CM | POA: Diagnosis present

## 2022-04-01 DIAGNOSIS — M1990 Unspecified osteoarthritis, unspecified site: Secondary | ICD-10-CM

## 2022-04-01 DIAGNOSIS — L02511 Cutaneous abscess of right hand: Secondary | ICD-10-CM | POA: Diagnosis present

## 2022-04-01 DIAGNOSIS — Z885 Allergy status to narcotic agent status: Secondary | ICD-10-CM

## 2022-04-01 DIAGNOSIS — I951 Orthostatic hypotension: Secondary | ICD-10-CM | POA: Diagnosis not present

## 2022-04-01 DIAGNOSIS — L298 Other pruritus: Secondary | ICD-10-CM | POA: Diagnosis not present

## 2022-04-01 DIAGNOSIS — Z794 Long term (current) use of insulin: Secondary | ICD-10-CM

## 2022-04-01 DIAGNOSIS — Z800 Family history of malignant neoplasm of digestive organs: Secondary | ICD-10-CM

## 2022-04-01 DIAGNOSIS — Z89432 Acquired absence of left foot: Secondary | ICD-10-CM

## 2022-04-01 DIAGNOSIS — B957 Other staphylococcus as the cause of diseases classified elsewhere: Secondary | ICD-10-CM | POA: Diagnosis not present

## 2022-04-01 DIAGNOSIS — N186 End stage renal disease: Secondary | ICD-10-CM | POA: Diagnosis present

## 2022-04-01 DIAGNOSIS — T8149XA Infection following a procedure, other surgical site, initial encounter: Secondary | ICD-10-CM

## 2022-04-01 DIAGNOSIS — I2510 Atherosclerotic heart disease of native coronary artery without angina pectoris: Secondary | ICD-10-CM | POA: Diagnosis not present

## 2022-04-01 DIAGNOSIS — I272 Pulmonary hypertension, unspecified: Secondary | ICD-10-CM | POA: Diagnosis not present

## 2022-04-01 DIAGNOSIS — T8789 Other complications of amputation stump: Secondary | ICD-10-CM | POA: Diagnosis not present

## 2022-04-01 DIAGNOSIS — I252 Old myocardial infarction: Secondary | ICD-10-CM

## 2022-04-01 DIAGNOSIS — I255 Ischemic cardiomyopathy: Secondary | ICD-10-CM | POA: Diagnosis present

## 2022-04-01 DIAGNOSIS — E1152 Type 2 diabetes mellitus with diabetic peripheral angiopathy with gangrene: Secondary | ICD-10-CM | POA: Diagnosis present

## 2022-04-01 DIAGNOSIS — G4730 Sleep apnea, unspecified: Secondary | ICD-10-CM | POA: Diagnosis not present

## 2022-04-01 DIAGNOSIS — I714 Abdominal aortic aneurysm, without rupture, unspecified: Secondary | ICD-10-CM | POA: Diagnosis present

## 2022-04-01 DIAGNOSIS — B9689 Other specified bacterial agents as the cause of diseases classified elsewhere: Secondary | ICD-10-CM | POA: Diagnosis present

## 2022-04-01 DIAGNOSIS — Z89021 Acquired absence of right finger(s): Secondary | ICD-10-CM

## 2022-04-01 DIAGNOSIS — I70268 Atherosclerosis of native arteries of extremities with gangrene, other extremity: Secondary | ICD-10-CM | POA: Diagnosis present

## 2022-04-01 DIAGNOSIS — L02519 Cutaneous abscess of unspecified hand: Principal | ICD-10-CM | POA: Diagnosis present

## 2022-04-01 DIAGNOSIS — Z7982 Long term (current) use of aspirin: Secondary | ICD-10-CM

## 2022-04-01 DIAGNOSIS — L97929 Non-pressure chronic ulcer of unspecified part of left lower leg with unspecified severity: Secondary | ICD-10-CM | POA: Diagnosis present

## 2022-04-01 DIAGNOSIS — Z87891 Personal history of nicotine dependence: Secondary | ICD-10-CM

## 2022-04-01 DIAGNOSIS — D638 Anemia in other chronic diseases classified elsewhere: Secondary | ICD-10-CM | POA: Diagnosis present

## 2022-04-01 DIAGNOSIS — E1142 Type 2 diabetes mellitus with diabetic polyneuropathy: Secondary | ICD-10-CM | POA: Diagnosis present

## 2022-04-01 DIAGNOSIS — D631 Anemia in chronic kidney disease: Secondary | ICD-10-CM | POA: Diagnosis not present

## 2022-04-01 DIAGNOSIS — E1122 Type 2 diabetes mellitus with diabetic chronic kidney disease: Secondary | ICD-10-CM | POA: Diagnosis not present

## 2022-04-01 DIAGNOSIS — E119 Type 2 diabetes mellitus without complications: Secondary | ICD-10-CM | POA: Diagnosis not present

## 2022-04-01 DIAGNOSIS — Z992 Dependence on renal dialysis: Secondary | ICD-10-CM | POA: Diagnosis not present

## 2022-04-01 DIAGNOSIS — I120 Hypertensive chronic kidney disease with stage 5 chronic kidney disease or end stage renal disease: Secondary | ICD-10-CM | POA: Diagnosis not present

## 2022-04-01 DIAGNOSIS — I132 Hypertensive heart and chronic kidney disease with heart failure and with stage 5 chronic kidney disease, or end stage renal disease: Secondary | ICD-10-CM | POA: Diagnosis present

## 2022-04-01 DIAGNOSIS — Y835 Amputation of limb(s) as the cause of abnormal reaction of the patient, or of later complication, without mention of misadventure at the time of the procedure: Secondary | ICD-10-CM | POA: Diagnosis present

## 2022-04-01 DIAGNOSIS — Z89611 Acquired absence of right leg above knee: Secondary | ICD-10-CM

## 2022-04-01 DIAGNOSIS — I739 Peripheral vascular disease, unspecified: Secondary | ICD-10-CM | POA: Diagnosis present

## 2022-04-01 DIAGNOSIS — T8741 Infection of amputation stump, right upper extremity: Secondary | ICD-10-CM | POA: Diagnosis not present

## 2022-04-01 DIAGNOSIS — Z888 Allergy status to other drugs, medicaments and biological substances status: Secondary | ICD-10-CM

## 2022-04-01 DIAGNOSIS — E1129 Type 2 diabetes mellitus with other diabetic kidney complication: Secondary | ICD-10-CM | POA: Diagnosis not present

## 2022-04-01 DIAGNOSIS — N2581 Secondary hyperparathyroidism of renal origin: Secondary | ICD-10-CM | POA: Diagnosis not present

## 2022-04-01 DIAGNOSIS — Z0389 Encounter for observation for other suspected diseases and conditions ruled out: Secondary | ICD-10-CM | POA: Diagnosis not present

## 2022-04-01 DIAGNOSIS — T8189XA Other complications of procedures, not elsewhere classified, initial encounter: Secondary | ICD-10-CM | POA: Diagnosis not present

## 2022-04-01 DIAGNOSIS — Z89511 Acquired absence of right leg below knee: Secondary | ICD-10-CM

## 2022-04-01 DIAGNOSIS — Z79899 Other long term (current) drug therapy: Secondary | ICD-10-CM

## 2022-04-01 DIAGNOSIS — Z7902 Long term (current) use of antithrombotics/antiplatelets: Secondary | ICD-10-CM

## 2022-04-01 DIAGNOSIS — I96 Gangrene, not elsewhere classified: Secondary | ICD-10-CM | POA: Diagnosis not present

## 2022-04-01 DIAGNOSIS — T391X5A Adverse effect of 4-Aminophenol derivatives, initial encounter: Secondary | ICD-10-CM | POA: Diagnosis not present

## 2022-04-01 HISTORY — DX: Peripheral vascular disease, unspecified: I73.9

## 2022-04-01 HISTORY — DX: Personal history of other medical treatment: Z92.89

## 2022-04-01 HISTORY — PX: AMPUTATION: SHX166

## 2022-04-01 LAB — GLUCOSE, CAPILLARY
Glucose-Capillary: 72 mg/dL (ref 70–99)
Glucose-Capillary: 112 mg/dL — ABNORMAL HIGH (ref 70–99)
Glucose-Capillary: 80 mg/dL (ref 70–99)
Glucose-Capillary: 58 mg/dL — ABNORMAL LOW (ref 70–99)
Glucose-Capillary: 60 mg/dL — ABNORMAL LOW (ref 70–99)
Glucose-Capillary: 71 mg/dL (ref 70–99)
Glucose-Capillary: 190 mg/dL — ABNORMAL HIGH (ref 70–99)

## 2022-04-01 LAB — CBC
HCT: 29.4 % — ABNORMAL LOW (ref 39.0–52.0)
Hemoglobin: 9.9 g/dL — ABNORMAL LOW (ref 13.0–17.0)
MCH: 30.9 pg (ref 26.0–34.0)
MCHC: 33.7 g/dL (ref 30.0–36.0)
MCV: 91.9 fL (ref 80.0–100.0)
Platelets: 191 10*3/uL (ref 150–400)
RBC: 3.2 MIL/uL — ABNORMAL LOW (ref 4.22–5.81)
RDW: 16 % — ABNORMAL HIGH (ref 11.5–15.5)
WBC: 6.4 10*3/uL (ref 4.0–10.5)
nRBC: 0 % (ref 0.0–0.2)

## 2022-04-01 LAB — CREATININE, SERUM
Creatinine, Ser: 12.04 mg/dL — ABNORMAL HIGH (ref 0.61–1.24)
GFR, Estimated: 5 mL/min — ABNORMAL LOW (ref >60)

## 2022-04-01 SURGERY — AMPUTATION DIGIT
Anesthesia: Monitor Anesthesia Care | Laterality: Right

## 2022-04-01 MED ORDER — ASPIRIN 81 MG PO CHEW
81.0000 mg | CHEWABLE_TABLET | Freq: Every day | ORAL | Status: DC
  Administered 2022-04-01 – 2022-04-05 (×5): 81 mg via ORAL
  Filled 2022-04-01 (×5): qty 1

## 2022-04-01 MED ORDER — HEPARIN SODIUM (PORCINE) 5000 UNIT/ML IJ SOLN
5000.0000 [IU] | Freq: Three times a day (TID) | INTRAMUSCULAR | Status: DC
  Administered 2022-04-01 – 2022-04-05 (×10): 5000 [IU] via SUBCUTANEOUS
  Filled 2022-04-01 (×11): qty 1

## 2022-04-01 MED ORDER — SODIUM CHLORIDE 0.9 % IV SOLN
1.0000 g | INTRAVENOUS | Status: DC
  Administered 2022-04-01 – 2022-04-04 (×4): 1 g via INTRAVENOUS
  Filled 2022-04-01 (×5): qty 10

## 2022-04-01 MED ORDER — INSULIN ASPART PROT & ASPART (70-30 MIX) 100 UNIT/ML ~~LOC~~ SUSP
15.0000 [IU] | Freq: Two times a day (BID) | SUBCUTANEOUS | Status: DC
  Administered 2022-04-02: 15 [IU] via SUBCUTANEOUS
  Filled 2022-04-01: qty 10

## 2022-04-01 MED ORDER — FENTANYL CITRATE (PF) 250 MCG/5ML IJ SOLN
INTRAMUSCULAR | Status: DC | PRN
Start: 2022-04-01 — End: 2022-04-01
  Administered 2022-04-01: 25 ug via INTRAVENOUS

## 2022-04-01 MED ORDER — CARVEDILOL 3.125 MG PO TABS
3.1250 mg | ORAL_TABLET | Freq: Two times a day (BID) | ORAL | Status: DC
  Administered 2022-04-01 – 2022-04-04 (×6): 3.125 mg via ORAL
  Filled 2022-04-01 (×8): qty 1

## 2022-04-01 MED ORDER — VANCOMYCIN HCL 1750 MG/350ML IV SOLN
1750.0000 mg | Freq: Once | INTRAVENOUS | Status: AC
  Administered 2022-04-01: 1750 mg via INTRAVENOUS
  Filled 2022-04-01: qty 350

## 2022-04-01 MED ORDER — ONDANSETRON HCL 4 MG/2ML IJ SOLN
INTRAMUSCULAR | Status: DC | PRN
  Administered 2022-04-01: 4 mg via INTRAVENOUS

## 2022-04-01 MED ORDER — HEPARIN SODIUM (PORCINE) 5000 UNIT/ML IJ SOLN: 5000.0000 [IU] | Freq: Three times a day (TID) | INTRAMUSCULAR | Status: DC

## 2022-04-01 MED ORDER — SODIUM CHLORIDE 0.9 % IV SOLN: INTRAVENOUS | Status: DC

## 2022-04-01 MED ORDER — PROPOFOL 10 MG/ML IV BOLUS
INTRAVENOUS | Status: AC
  Filled 2022-04-01: qty 20

## 2022-04-01 MED ORDER — FENTANYL CITRATE (PF) 250 MCG/5ML IJ SOLN
INTRAMUSCULAR | Status: AC
  Filled 2022-04-01: qty 5

## 2022-04-01 MED ORDER — DEXTROSE 50 % IV SOLN
25.0000 mL | Freq: Once | INTRAVENOUS | Status: AC
  Administered 2022-04-01: 25 mL via INTRAVENOUS

## 2022-04-01 MED ORDER — ASCORBIC ACID 500 MG PO TABS
1000.0000 mg | ORAL_TABLET | Freq: Every day | ORAL | Status: DC
  Administered 2022-04-01 – 2022-04-05 (×5): 1000 mg via ORAL
  Filled 2022-04-01 (×5): qty 2

## 2022-04-01 MED ORDER — CEFAZOLIN SODIUM-DEXTROSE 2-4 GM/100ML-% IV SOLN
2.0000 g | INTRAVENOUS | Status: AC
  Administered 2022-04-01: 2 g via INTRAVENOUS
  Filled 2022-04-01: qty 100

## 2022-04-01 MED ORDER — INSULIN ASPART PROT & ASPART (70-30 MIX) 100 UNIT/ML PEN
215.0000 [IU] | PEN_INJECTOR | Freq: Two times a day (BID) | SUBCUTANEOUS | Status: DC
Start: 2022-04-01 — End: 2022-04-01

## 2022-04-01 MED ORDER — INSULIN ASPART 100 UNIT/ML IJ SOLN: 0.0000 [IU] | Freq: Every day | INTRAMUSCULAR | Status: DC

## 2022-04-01 MED ORDER — CLOPIDOGREL BISULFATE 75 MG PO TABS
75.0000 mg | ORAL_TABLET | Freq: Every day | ORAL | Status: DC
  Administered 2022-04-01 – 2022-04-05 (×5): 75 mg via ORAL
  Filled 2022-04-01 (×5): qty 1

## 2022-04-01 MED ORDER — ATORVASTATIN CALCIUM 80 MG PO TABS
80.0000 mg | ORAL_TABLET | Freq: Every morning | ORAL | Status: DC
  Administered 2022-04-02 – 2022-04-05 (×4): 80 mg via ORAL
  Filled 2022-04-01 (×5): qty 1

## 2022-04-01 MED ORDER — ONDANSETRON HCL 4 MG/2ML IJ SOLN: 4.0000 mg | Freq: Four times a day (QID) | INTRAMUSCULAR | Status: DC | PRN

## 2022-04-01 MED ORDER — HYDROMORPHONE HCL 1 MG/ML IJ SOLN
1.0000 mg | INTRAMUSCULAR | Status: DC | PRN
  Filled 2022-04-01: qty 1

## 2022-04-01 MED ORDER — ACETAMINOPHEN 325 MG PO TABS: 325.0000 mg | ORAL_TABLET | Freq: Four times a day (QID) | ORAL | Status: DC | PRN

## 2022-04-01 MED ORDER — MIDAZOLAM HCL 2 MG/2ML IJ SOLN
INTRAMUSCULAR | Status: DC | PRN
  Administered 2022-04-01: .5 mg via INTRAVENOUS

## 2022-04-01 MED ORDER — ALLOPURINOL 100 MG PO TABS
100.0000 mg | ORAL_TABLET | Freq: Every day | ORAL | Status: DC
  Administered 2022-04-02 – 2022-04-05 (×4): 100 mg via ORAL
  Filled 2022-04-01 (×5): qty 1

## 2022-04-01 MED ORDER — INSULIN ASPART 100 UNIT/ML IJ SOLN: 0.0000 [IU] | Freq: Three times a day (TID) | INTRAMUSCULAR | Status: DC

## 2022-04-01 MED ORDER — LINACLOTIDE 72 MCG PO CAPS
72.0000 ug | ORAL_CAPSULE | Freq: Every morning | ORAL | Status: DC
  Administered 2022-04-02 – 2022-04-05 (×4): 72 ug via ORAL
  Filled 2022-04-01 (×4): qty 1

## 2022-04-01 MED ORDER — INSULIN ASPART 100 UNIT/ML IJ SOLN: 0.0000 [IU] | INTRAMUSCULAR | Status: DC | PRN

## 2022-04-01 MED ORDER — TRAMADOL HCL 50 MG PO TABS
50.0000 mg | ORAL_TABLET | Freq: Four times a day (QID) | ORAL | Status: DC
  Administered 2022-04-01 – 2022-04-05 (×12): 50 mg via ORAL
  Filled 2022-04-01 (×13): qty 1

## 2022-04-01 MED ORDER — MORPHINE SULFATE (PF) 2 MG/ML IV SOLN
0.5000 mg | INTRAVENOUS | Status: DC | PRN
  Administered 2022-04-02 (×2): 1 mg via INTRAVENOUS
  Filled 2022-04-01 (×2): qty 1

## 2022-04-01 MED ORDER — HYDROCODONE-ACETAMINOPHEN 5-325 MG PO TABS
1.0000 | ORAL_TABLET | ORAL | Status: DC | PRN
  Administered 2022-04-02: 2 via ORAL
  Filled 2022-04-01: qty 2

## 2022-04-01 MED ORDER — GABAPENTIN 100 MG PO CAPS
100.0000 mg | ORAL_CAPSULE | Freq: Three times a day (TID) | ORAL | Status: DC
  Administered 2022-04-01 – 2022-04-05 (×10): 100 mg via ORAL
  Filled 2022-04-01 (×11): qty 1

## 2022-04-01 MED ORDER — EZETIMIBE 10 MG PO TABS
10.0000 mg | ORAL_TABLET | Freq: Every day | ORAL | Status: DC
  Administered 2022-04-02 – 2022-04-05 (×4): 10 mg via ORAL
  Filled 2022-04-01 (×5): qty 1

## 2022-04-01 MED ORDER — ACETAMINOPHEN 650 MG RE SUPP: 650.0000 mg | Freq: Four times a day (QID) | RECTAL | Status: DC | PRN

## 2022-04-01 MED ORDER — PANTOPRAZOLE SODIUM 40 MG PO TBEC
40.0000 mg | DELAYED_RELEASE_TABLET | Freq: Every day | ORAL | Status: DC
Start: 2022-04-01 — End: 2022-04-05
  Administered 2022-04-01 – 2022-04-05 (×5): 40 mg via ORAL
  Filled 2022-04-01 (×5): qty 1

## 2022-04-01 MED ORDER — ROPIVACAINE HCL 5 MG/ML IJ SOLN
INTRAMUSCULAR | Status: DC | PRN
  Administered 2022-04-01: 30 mL via PERINEURAL

## 2022-04-01 MED ORDER — ORAL CARE MOUTH RINSE: 15.0000 mL | Freq: Once | OROMUCOSAL | Status: AC

## 2022-04-01 MED ORDER — DEXTROSE 50 % IV SOLN
INTRAVENOUS | Status: AC
  Administered 2022-04-01: 25 mL via INTRAVENOUS
  Filled 2022-04-01: qty 50

## 2022-04-01 MED ORDER — PROPOFOL 500 MG/50ML IV EMUL
INTRAVENOUS | Status: DC | PRN
  Administered 2022-04-01: 70 ug/kg/min via INTRAVENOUS

## 2022-04-01 MED ORDER — ACETAMINOPHEN 325 MG PO TABS
650.0000 mg | ORAL_TABLET | Freq: Four times a day (QID) | ORAL | Status: DC | PRN
Start: 2022-04-01 — End: 2022-04-02

## 2022-04-01 MED ORDER — MIDAZOLAM HCL 2 MG/2ML IJ SOLN
INTRAMUSCULAR | Status: AC
  Filled 2022-04-01: qty 2

## 2022-04-01 MED ORDER — MAGNESIUM HYDROXIDE 400 MG/5ML PO SUSP: 30.0000 mL | Freq: Every day | ORAL | Status: DC | PRN

## 2022-04-01 MED ORDER — MIDAZOLAM HCL 2 MG/2ML IJ SOLN
INTRAMUSCULAR | Status: AC
Start: 2022-04-01 — End: ?
  Filled 2022-04-01: qty 2

## 2022-04-01 MED ORDER — CHLORHEXIDINE GLUCONATE 0.12 % MT SOLN
15.0000 mL | Freq: Once | OROMUCOSAL | Status: AC
  Administered 2022-04-01: 15 mL via OROMUCOSAL
  Filled 2022-04-01: qty 15

## 2022-04-01 MED ORDER — LIDOCAINE 2% (20 MG/ML) 5 ML SYRINGE
INTRAMUSCULAR | Status: DC | PRN
  Administered 2022-04-01: 30 mg via INTRAVENOUS

## 2022-04-01 MED ORDER — LACTATED RINGERS IV SOLN: INTRAVENOUS | Status: DC

## 2022-04-01 MED ORDER — ONDANSETRON HCL 4 MG PO TABS: 4.0000 mg | ORAL_TABLET | Freq: Four times a day (QID) | ORAL | Status: DC | PRN

## 2022-04-01 MED ORDER — DEXTROSE 50 % IV SOLN
25.0000 mL | Freq: Once | INTRAVENOUS | Status: AC
  Filled 2022-04-01: qty 50

## 2022-04-01 MED ORDER — VANCOMYCIN VARIABLE DOSE PER UNSTABLE RENAL FUNCTION (PHARMACIST DOSING): Status: DC

## 2022-04-01 MED ORDER — FENTANYL CITRATE (PF) 100 MCG/2ML IJ SOLN
INTRAMUSCULAR | Status: AC
  Filled 2022-04-01: qty 2

## 2022-04-01 SURGICAL SUPPLY — 46 items
BAG COUNTER SPONGE SURGICOUNT (BAG) ×3 IMPLANT
BAG SPNG CNTER NS LX DISP (BAG) ×1
BLADE LONG MED 31X9 (MISCELLANEOUS) ×2 IMPLANT
BNDG CMPR 5X2 CHSV 1 LYR STRL (GAUZE/BANDAGES/DRESSINGS) ×2
BNDG CMPR 9X4 STRL LF SNTH (GAUZE/BANDAGES/DRESSINGS) ×1
BNDG COHESIVE 1X5 TAN STRL LF (GAUZE/BANDAGES/DRESSINGS) ×1 IMPLANT
BNDG COHESIVE 2X5 TAN ST LF (GAUZE/BANDAGES/DRESSINGS) ×2 IMPLANT
BNDG ESMARK 4X9 LF (GAUZE/BANDAGES/DRESSINGS) ×3 IMPLANT
BNDG GAUZE DERMACEA FLUFF (GAUZE/BANDAGES/DRESSINGS) ×2
BNDG GAUZE DERMACEA FLUFF 4 (GAUZE/BANDAGES/DRESSINGS) IMPLANT
BNDG GAUZE ELAST 4 BULKY (GAUZE/BANDAGES/DRESSINGS) IMPLANT
BNDG GZE DERMACEA 4 6PLY (GAUZE/BANDAGES/DRESSINGS) ×1
CORD BIPOLAR FORCEPS 12FT (ELECTRODE) ×2 IMPLANT
CUFF TOURN SGL QUICK 18X4 (TOURNIQUET CUFF) ×2 IMPLANT
CUFF TOURN SGL QUICK 24 (TOURNIQUET CUFF)
CUFF TRNQT CYL 24X4X16.5-23 (TOURNIQUET CUFF) IMPLANT
DURAPREP 26ML APPLICATOR (WOUND CARE) ×2 IMPLANT
GAUZE PACKING IODOFORM 1/2 (PACKING) ×1 IMPLANT
GAUZE SPONGE 4X4 12PLY STRL (GAUZE/BANDAGES/DRESSINGS) ×1 IMPLANT
GAUZE XEROFORM 1X8 LF (GAUZE/BANDAGES/DRESSINGS) ×2 IMPLANT
GLOVE BIO SURGEON STRL SZ7.5 (GLOVE) ×3 IMPLANT
GLOVE BIOGEL PI IND STRL 8 (GLOVE) ×2 IMPLANT
GLOVE BIOGEL PI INDICATOR 8 (GLOVE) ×2
GOWN STRL REUS W/ TWL LRG LVL3 (GOWN DISPOSABLE) ×1 IMPLANT
GOWN STRL REUS W/ TWL XL LVL3 (GOWN DISPOSABLE) ×1 IMPLANT
GOWN STRL REUS W/TWL LRG LVL3 (GOWN DISPOSABLE) ×2
GOWN STRL REUS W/TWL XL LVL3 (GOWN DISPOSABLE) ×2
KIT BASIN OR (CUSTOM PROCEDURE TRAY) ×3 IMPLANT
KIT TURNOVER KIT B (KITS) ×3 IMPLANT
NDL HYPO 25GX1X1/2 BEV (NEEDLE) IMPLANT
NEEDLE HYPO 25GX1X1/2 BEV (NEEDLE) ×2 IMPLANT
NS IRRIG 1000ML POUR BTL (IV SOLUTION) ×3 IMPLANT
PACK ORTHO EXTREMITY (CUSTOM PROCEDURE TRAY) ×2 IMPLANT
PAD ARMBOARD 7.5X6 YLW CONV (MISCELLANEOUS) ×4 IMPLANT
PAD CAST 4YDX4 CTTN HI CHSV (CAST SUPPLIES) IMPLANT
PADDING CAST COTTON 4X4 STRL (CAST SUPPLIES)
SPECIMEN JAR SMALL (MISCELLANEOUS) ×2 IMPLANT
SUT CHROMIC 6 0 PS 4 (SUTURE) IMPLANT
SUT ETHILON 4 0 PS 2 18 (SUTURE) ×1 IMPLANT
SUT MON AB 5-0 PS2 18 (SUTURE) ×1 IMPLANT
SUT VICRYL 4-0 PS2 18IN ABS (SUTURE) ×1 IMPLANT
SWAB COLLECTION DEVICE MRSA (MISCELLANEOUS) ×1 IMPLANT
SWAB CULTURE ESWAB REG 1ML (MISCELLANEOUS) ×1 IMPLANT
SYR CONTROL 10ML LL (SYRINGE) ×1 IMPLANT
TOWEL GREEN STERILE (TOWEL DISPOSABLE) ×2 IMPLANT
UNDERPAD 30X36 HEAVY ABSORB (UNDERPADS AND DIAPERS) ×3 IMPLANT

## 2022-04-01 NOTE — Anesthesia Postprocedure Evaluation (Signed)
Anesthesia Post Note  Patient: Edward [name]  Procedure(s) Performed: RIGHT INDEX FINGER AMPUTATION AT METACARPAL PHALANGEAL JOINT, INCISION AND DRAINAGE RIGHT HAND (Right)     Patient location during evaluation: PACU Anesthesia Type: Regional and MAC Level of consciousness: awake and alert Pain management: pain level controlled Vital Signs Assessment: post-procedure vital signs reviewed and stable Respiratory status: spontaneous breathing, nonlabored ventilation, respiratory function stable and patient connected to nasal cannula oxygen Cardiovascular status: stable and blood pressure returned to baseline Postop Assessment: no apparent nausea or vomiting Anesthetic complications: no   No notable events documented.  Last Vitals:  Vitals:   04/01/22 1650 04/01/22 1720  BP: (!) 141/91 (!) 142/88  Pulse: 79 79  Resp: 17 17  Temp: (!) 36.4 C   SpO2: 100% 100%    Last Pain:  Vitals:   04/01/22 1650  TempSrc:   PainSc: Asleep                 Tou Hayner S

## 2022-04-01 NOTE — Anesthesia Procedure Notes (Signed)
Anesthesia Regional Block: Supraclavicular block   Pre-Anesthetic Checklist: , timeout performed,  Correct Patient, Correct Site, Correct Laterality,  Correct Procedure, Correct Position, site marked,  Risks and benefits discussed,  Surgical consent,  Pre-op evaluation,  At surgeon's request and post-op pain management  Laterality: Right  Prep: chloraprep       Needles:  Injection technique: Single-shot  Needle Type: Echogenic Stimulator Needle     Needle Length: 10cm  Needle Gauge: 20     Additional Needles:   Procedures:,,,, ultrasound used (permanent image in chart),,    Narrative:  Start time: 04/01/2022 1:32 PM End time: 04/01/2022 1:36 PM Injection made incrementally with aspirations every 5 mL.  Performed by: Personally  Anesthesiologist: Lidia Collum, MD  Additional Notes: Standard monitors applied. Skin prepped. Good needle visualization with ultrasound. Injection made in 5cc increments with no resistance to injection. Patient tolerated the procedure well.

## 2022-04-01 NOTE — Anesthesia Preprocedure Evaluation (Signed)
Anesthesia Evaluation  Patient identified by MRN, date of birth, ID band Patient awake    Reviewed: Allergy & Precautions, NPO status , Patient's Chart, lab work & pertinent test results  History of Anesthesia Complications Negative for: history of anesthetic complications  Airway Mallampati: II  TM Distance: >3 FB Neck ROM: Full    Dental   Pulmonary sleep apnea , former smoker,    Pulmonary exam normal        Cardiovascular hypertension, + CAD, + Past MI, + Cardiac Stents (DES 07/29/2018), + Peripheral Vascular Disease and +CHF  Normal cardiovascular exam  Echo 09/27/20: EF 50-55%, distal septal/apical hypokinesis, mild LVH, g2dd, normal RVSF, mod pulm HTN (RVSP 47.9), mod to severe MR, mild AR, no AS   Neuro/Psych negative neurological ROS     GI/Hepatic Neg liver ROS, GERD  ,  Endo/Other  diabetes, Type 2, Insulin Dependent  Renal/GU ESRF and DialysisRenal disease  negative genitourinary   Musculoskeletal  (+) Arthritis ,   Abdominal   Peds  Hematology  (+) Blood dyscrasia, anemia ,   Anesthesia Other Findings   Reproductive/Obstetrics                            Anesthesia Physical Anesthesia Plan  ASA: 4  Anesthesia Plan: MAC and Regional   Post-op Pain Management: Regional block*   Induction: Intravenous  PONV Risk Score and Plan: 1 and Propofol infusion, TIVA and Treatment may vary due to age or medical condition  Airway Management Planned: Natural Airway, Nasal Cannula and Simple Face Mask  Additional Equipment: None  Intra-op Plan:   Post-operative Plan:   Informed Consent: I have reviewed the patients History and Physical, chart, labs and discussed the procedure including the risks, benefits and alternatives for the proposed anesthesia with the patient or authorized representative who has indicated his/her understanding and acceptance.       Plan Discussed with:    Anesthesia Plan Comments:        Anesthesia Quick Evaluation

## 2022-04-01 NOTE — H&P (Signed)
Edward [name] is an 51 y.o. male.   Chief Complaint: digit necrosis HPI: 51 yo male s/p amputation right index finger through proximal phalanx.  Has had continued pain and ischemia of the distal digit.  He wishes to have amputation of right index finger at mp level to try to achieve healing.  Also with necrosis of distal aspect right ring finger.  He wishes to proceed with amputation of the ring finger.  He wants to try to preserve as much length of the finger as possible.  Allergies:  Allergies  Allergen Reactions   Crestor [Rosuvastatin] Rash    Also had a red eye   Dilaudid [Hydromorphone] Itching   Benadryl [Diphenhydramine] Rash    Past Medical History:  Diagnosis Date   Allergy    Anemia    getting Venofer with dialysis   Anxiety    self reported, sometimes-situational anxiety   Arthritis    bilateral hands/LEFT hip   CHF (congestive heart failure) (HCC)    Chronic kidney disease    t th s dialysis- [place] ---ESRD   Coronary artery disease    Depression    self reported, sometimes-situational depression   Diabetes mellitus    type II- on meds   Dyspnea    sometimes- fluid   GERD (gastroesophageal reflux disease)    on meds   History of blood transfusion    Hypertension    no longer on medication since starting on Hemodialysis   Myocardial infarction [facility]) 2019   Peripheral vascular disease ([place])    Sleep apnea     Past Surgical History:  Procedure Laterality Date   A/V FISTULAGRAM Left 03/06/2020   Procedure: A/V FISTULAGRAM;  Surgeon: Waynetta Sandy, MD;  Location: [place] [facility];  Service: Cardiovascular;  Laterality: Left;   ABDOMINAL AORTOGRAM N/A 01/07/2022   Procedure: ABDOMINAL AORTOGRAM;  Surgeon: Waynetta Sandy, MD;  Location: [place] [facility];  Service: Cardiovascular;  Laterality: N/A;   ABDOMINAL AORTOGRAM W/LOWER EXTREMITY N/A 04/09/2021   Procedure: ABDOMINAL AORTOGRAM W/LOWER EXTREMITY;  Surgeon: Waynetta Sandy, MD;  Location: [place] [facility];  Service: Cardiovascular;  Laterality: N/A;   AMPUTATION Left 05/10/2016   Procedure: Left Transmetatarsal Amputation;  Surgeon: [name], MD;  Location: [place];  Service: Orthopedics;  Laterality: Left;   AMPUTATION Right 05/16/2018   Procedure: PARTIAL RAY AMPUTATION FIFTH TOE RIGHT FOOT;  Surgeon: [name], DPM;  Location: [place];  Service: Podiatry;  Laterality: Right;   AMPUTATION Right 06/17/2018   Procedure: AMPUTATION 4th RAY Right Foot;  Surgeon: Trula Slade, DPM;  Location: [place];  Service: Podiatry;  Laterality: Right;   AMPUTATION Right 06/19/2018   Procedure: RIGHT BELOW KNEE AMPUTATION;  Surgeon: [name], MD;  Location: [place];  Service: Orthopedics;  Laterality: Right;   AMPUTATION Left 10/05/2021   Procedure: LEFT RING FINGER AMPUTATION;  Surgeon: Daryll Brod, MD;  Location: [place];  Service: Orthopedics;  Laterality: Left;  45 MIN   AMPUTATION Left 11/15/2021   Procedure: LEFT INDEX FINGER AMPUTATION;  Surgeon: Leanora Cover, MD;  Location: [place];  Service: Orthopedics;  Laterality: Left;  60 MIN   AMPUTATION Right 01/08/2022   Procedure: AMPUTATION ABOVE KNEE;  Surgeon: Waynetta Sandy, MD;  Location: [place];  Service: Vascular;  Laterality: Right;   AMPUTATION Bilateral 01/12/2022   Procedure: LEFT INDEX FINGER AMPUTATION, RIGHT INDEX, LONG AND SMALL DIGITS AMPUTATION;  Surgeon: Leanora Cover, MD;  Location:  [place] [facility];  Service: Orthopedics;  Laterality: Bilateral;   AORTIC ARCH ANGIOGRAPHY N/A 01/07/2022   Procedure: AORTIC ARCH ANGIOGRAPHY;  Surgeon: Waynetta Sandy, MD;  Location: [place] [facility];  Service: Cardiovascular;  Laterality: N/A;   APPLICATION OF WOUND VAC Right 06/19/2018   Procedure: APPLICATION OF WOUND VAC;  Surgeon: [name], MD;  Location: [place];  Service: Orthopedics;  Laterality: Right;   AV FISTULA PLACEMENT Left 11/25/2019   Procedure: LEFT ARM Brachiocephalic   ARTERIOVENOUS (AV) FISTULA CREATION;  Surgeon: Rosetta Posner, MD;  Location: [place];  Service: Vascular;  Laterality: Left;   [place] Left 07/19/2020   Procedure: LEFT SECOND STAGE [place];  Surgeon: Waynetta Sandy, MD;  Location: [place];  Service: Vascular;  Laterality: Left;   CARDIAC CATHETERIZATION  07/29/2018   CIRCUMCISION  09/02/2011   Procedure: CIRCUMCISION ADULT;  Surgeon: Molli Hazard, MD;  Location: [place];  Service: Urology;  Laterality: N/A;   CORONARY STENT INTERVENTION N/A 07/29/2018   Procedure: CORONARY STENT INTERVENTION;  Surgeon: Leonie Man, MD;  Location: [place] [facility];  Service: Cardiovascular;  Laterality: N/A;   DEBRIDEMENT AND CLOSURE WOUND Left 11/15/2021   Procedure: LEFT HAND WOUND CLOSURE;  Surgeon: Leanora Cover, MD;  Location: [place];  Service: Orthopedics;  Laterality: Left;   I & D EXTREMITY  09/02/2011   Procedure: IRRIGATION AND DEBRIDEMENT EXTREMITY;  Surgeon: Theodoro Kos, DO;  Location: [place];  Service: Plastics;  Laterality: N/A;   INCISION AND DRAINAGE OF WOUND  08/12/2011   Procedure: IRRIGATION AND DEBRIDEMENT WOUND;  Surgeon: Zenovia Jarred, MD;  Location: [place];  Service: General;;  perineum   INSERTION OF DIALYSIS CATHETER N/A 11/25/2019   Procedure: INSERTION OF [place]igh Internal Jugular DIALYSIS CATHETER;  Surgeon: Rosetta Posner, MD;  Location: [place];  Service: Vascular;  Laterality: N/A;   [place]  08/12/2011   Procedure: IRRIGATION AND DEBRIDEMENT ABSCESS;  Surgeon: Molli Hazard, MD;  Location: [place];  Service: Urology;  Laterality: N/A;  irrigation and debridment perineum     IRRIGATION AND DEBRIDEMENT ABSCESS  08/14/2011   Procedure: MINOR INCISION AND DRAINAGE OF ABSCESS;  Surgeon: Molli Hazard, MD;  Location: [place];  Service: Urology;  Laterality: N/A;  Perineal Wound Debridement;Placement Bilateral  Testicular Thigh Pouches   LEFT HEART CATH AND CORONARY ANGIOGRAPHY N/A 07/29/2018   Procedure: LEFT HEART CATH AND CORONARY ANGIOGRAPHY;  Surgeon: Leonie Man, MD;  Location: [place] [facility];  Service: Cardiovascular;  Laterality: N/A;   LOWER EXTREMITY ANGIOGRAPHY Bilateral 01/07/2022   Procedure: Lower Extremity Angiography;  Surgeon: Waynetta Sandy, MD;  Location: [place] [facility];  Service: Cardiovascular;  Laterality: Bilateral;   PERIPHERAL VASCULAR ATHERECTOMY Left 04/09/2021   Procedure: PERIPHERAL VASCULAR ATHERECTOMY;  Surgeon: Waynetta Sandy, MD;  Location: [place] [facility];  Service: Cardiovascular;  Laterality: Left;  TP trunk and peroneal arteries   PERIPHERAL VASCULAR BALLOON ANGIOPLASTY Left 04/09/2021   Procedure: PERIPHERAL VASCULAR BALLOON ANGIOPLASTY;  Surgeon: Waynetta Sandy, MD;  Location: [place] [facility];  Service: Cardiovascular;  Laterality: Left;  Popliteal   REVISON OF ARTERIOVENOUS FISTULA Left 05/24/2020   Procedure: LEFT ARM ARTERIOVENOUS FISTULA  CONVERSION TO BASILIC VEIN FISTULA;  Surgeon: Waynetta Sandy, MD;  Location: [place];  Service: Vascular;  Laterality: Left;   TOTAL HIP ARTHROPLASTY Left 10/11/2016   Procedure: LEFT TOTAL HIP ARTHROPLASTY  ANTERIOR APPROACH;  Surgeon: Mcarthur Rossetti, MD;  [facility]: [facility];  Service: Orthopedics;  Laterality: Left;   UPPER EXTREMITY ANGIOGRAPHY Bilateral 01/07/2022   Procedure: Upper Extremity Angiography;  Surgeon: Waynetta Sandy, MD;  Location: [place] [facility];  Service: Cardiovascular;  Laterality: Bilateral;    Family History: Family History  Problem Relation Age of Onset   Diabetes Other    Pancreatic cancer Mother 94   Colon cancer Neg Hx    Colon polyps Neg Hx    Esophageal cancer Neg Hx    Rectal cancer Neg Hx    Stomach cancer Neg Hx     Social History:   reports that he quit smoking about 2 years ago. His smoking use included  cigarettes. He has a 6.00 pack-year smoking history. He has never used smokeless tobacco. He reports that he does not currently use alcohol. He reports that he does not use drugs.  Medications: Medications Prior to Admission  Medication Sig Dispense Refill   acetaminophen (TYLENOL) 500 MG tablet Take 500-1,000 mg by mouth every 6 (six) hours as needed for moderate pain or headache.     allopurinol (ZYLOPRIM) 100 MG tablet Take 100 mg by mouth daily.     aspirin 81 MG chewable tablet Chew 81 mg by mouth daily.     atorvastatin (LIPITOR) 80 MG tablet Take 80 mg by mouth in the morning.     clopidogrel (PLAVIX) 75 MG tablet TAKE ONE TABLET BY MOUTH ONCE DAILY 30 tablet 11   gabapentin (NEURONTIN) 100 MG capsule Take 100 mg by mouth 3 (three) times daily.     insulin aspart protamine - aspart (NOVOLOG MIX 70/30 FLEXPEN) (70-30) 100 UNIT/ML FlexPen Inject 0.3 mLs (30 Units total) into the skin 2 (two) times daily with a meal. (Patient taking differently: Inject 30 Units into the skin 2 (two) times daily with a meal.) 15 mL 0   linaclotide (LINZESS) 72 MCG capsule Take 1 capsule (72 mcg total) by mouth daily before breakfast. (Patient taking differently: Take 72 mcg by mouth in the morning.) 8 capsule 0   pantoprazole (PROTONIX) 40 MG tablet TAKE ONE TABLET BY MOUTH EVERY MORNING and TAKE ONE TABLET BY MOUTH EVERYDAY AT BEDTIME (Patient taking differently: Take 40 mg by mouth daily.) 180 tablet 1   traMADol (ULTRAM) 50 MG tablet Take 50 mg by mouth every 6 (six) hours as needed for pain.     triamcinolone ointment (KENALOG) 0.1 % 1 application. daily as needed (rash).     VELPHORO 500 MG chewable tablet Chew 1,000 mg by mouth 3 (three) times daily with meals.     bismuth subsalicylate (PEPTO BISMOL) 262 MG/15ML suspension Take 30 mLs by mouth every 6 (six) hours as needed for indigestion.     blood glucose meter kit and supplies Dispense based on patient and insurance preference. Use up to four times  daily as directed. (FOR ICD-9 250.00, 250.01). 1 each 0   ezetimibe (ZETIA) 10 MG tablet Take 1 tablet (10 mg total) by mouth daily. 30 tablet 3   Insulin Pen Needle 31G X 5 MM MISC Use daily to inject insulin as instructed 100 each 2    Results for orders placed or performed during the hospital encounter of 04/01/22 (from the past 48 hour(s))  Glucose, capillary     Status: None   Collection Time: 04/01/22 11:51 AM  Result Value Ref Range   Glucose-Capillary 72 70 - 99 mg/dL    Comment: Glucose  reference range applies only to samples taken after fasting for at least 8 hours.   Comment 1 Notify RN    Comment 2 Document in Chart     No results found.    Blood pressure (!) 144/87, pulse 86, temperature 97.8 F (36.6 C), temperature source Oral, resp. rate 20, height 6' 1"  (1.854 m), weight 97.5 kg, SpO2 100 %.  General appearance: alert, cooperative, and appears stated age Head: Normocephalic, without obvious abnormality, atraumatic Neck: supple, symmetrical, trachea midline Extremities: ischemia of distal aspect right index finger.  Necrosis of distal aspect of ring finger Pulses: 2+ and symmetric Skin: Skin color, texture, turgor normal. No rashes or lesions Neurologic: Grossly normal Incision/Wound: none  Assessment/Plan Right index and ring finger ischemia/necrosis.  Plan amputation index finger at mp joint and ring finger through proximal phalanx or pip joint.  Non operative and operative treatment options have been discussed with the patient and patient wishes to proceed with operative treatment. Risks, benefits, and alternatives of surgery have been discussed and the patient agrees with the plan of care.   Leanora Cover 04/01/2022, 12:16 PM

## 2022-04-01 NOTE — Progress Notes (Signed)
Hypoglycemic Event  CBG: 61  Treatment: D50 25 mL (12.5 gm)  Symptoms: None  Follow-up CBG: Time:1220 CBG Result:  Possible Reasons for Event: Inadequate meal intake  Comments/MD notified:Dr. Denyse Dago aware    Jeane Cashatt Margaretha Sheffield

## 2022-04-01 NOTE — Progress Notes (Signed)
Pharmacy Antibiotic Note  Edward [name] is a 50 y.o. male admitted on 04/01/2022 right hand infection, prior amputation of R index finger through proximal phalanx. Continued pain and ischemia. Now s/p amputation of R index finger through MP point, I&D of R index finger and hand/palm, and irrigation and packing of R small finger wound infection. Pharmacy has been consulted for Vancomycin and Cefepime dosing.    Purulence noted in OR > culture sent.   ESRD, usual TTS hemodialysis.  Plan: Cefepime 1gm IV q24h - after HD on HD days Vancomycin 1750 mg IV x 1 loading dose. Will follow up for HD plans and schedule subsequent doses after HD. Expect HD on 8/15. Follow culture data, clinical progress and antibiotic plans.  Height: '6\' 1"'$  (185.4 cm) Weight: 97.5 kg (215 lb) IBW/kg (Calculated) : 79.9  Temp (24hrs), Avg:97.7 F (36.5 C), Min:97.5 F (36.4 C), Max:97.8 F (36.6 C)   Allergies  Allergen Reactions   Crestor [Rosuvastatin] Rash    Also had a red eye   Dilaudid [Hydromorphone] Itching   Benadryl [Diphenhydramine] Rash    Antimicrobials this admission:  Cefazolin x 1 pre-op on 8/14  Vancomycin 8/14>>  Cefepime 8/14 >>  Microbiology results:  8/14 surgical/deep wound (finger): sent  8/14 blood:  8/14 MRSA PCR:    Thank you for allowing pharmacy to be a part of this patient's care.  Arty Baumgartner, [place] 04/01/2022 7:51 PM

## 2022-04-01 NOTE — H&P (Signed)
TRH H&P   Patient Demographics:    Edward [name], is a 51 y.o. male  MRN: [id number]   DOB - September 29, 1970  Admit Date - 04/01/2022  Outpatient Primary MD for the patient is [name], MD  Outpatient Specialists: Dr Fredna Dow, Dr Donzetta Matters    Patient coming from: PACU  CC - Right hand abscess   HPI:    Edward [name]  is a 51 y.o. male, history of severe PAD s/p left transmetatarsal amputation, right BKA with necrotic stump, multiple amputation in left hand fingers, ESRD on TTS schedule for dialysis, DM type 2 insulin-dependent, dyslipidemia, CAD with MI in past, combined systolic and diastolic CHF EF 43%, orthostatic hypotension of chronic disease, AAA, recent Zio patch placement, who was brought in for an elective R digit amputation, by hand surgeon Dr. Fredna Dow, during surgery he was found to have a lot of pus in his right hand, I was requested to admit him for IV antibiotics followed by a repeat surgery later this week.  Patient is currently in PACU, he is in no visible distress he denies any fever or chills, no headache, no shortness of breath, no chest pain or abdominal pain, no diarrhea or dysuria, he makes minimal urine as he is ESRD, no blood in stool or urine, no new focal weakness or generalized weakness.  Overall he feels okay.    Review of systems:    A full 10 point Review of Systems was done, except as stated above, all other Review of Systems were negative.   With Past History of the following :    Past Medical History:  Diagnosis Date   Allergy    Anemia    getting Venofer with dialysis   Anxiety    self reported, sometimes-situational anxiety   Arthritis    bilateral hands/LEFT hip   CHF (congestive  heart failure) (HCC)    Chronic kidney disease    t th s dialysis- [place] ---ESRD   Coronary artery disease    Depression    self reported, sometimes-situational depression   Diabetes mellitus    type II- on meds   Dyspnea    sometimes- fluid   GERD (gastroesophageal reflux disease)    on meds   History of blood transfusion    Hypertension    no longer on medication since starting  on Hemodialysis   Myocardial infarction [facility]) 2019   Peripheral vascular disease ([place])    Sleep apnea       Past Surgical History:  Procedure Laterality Date   A/V FISTULAGRAM Left 03/06/2020   Procedure: A/V FISTULAGRAM;  Surgeon: Waynetta Sandy, MD;  Location: [place] [facility];  Service: Cardiovascular;  Laterality: Left;   ABDOMINAL AORTOGRAM N/A 01/07/2022   Procedure: ABDOMINAL AORTOGRAM;  Surgeon: Waynetta Sandy, MD;  Location: [place] [facility];  Service: Cardiovascular;  Laterality: N/A;   ABDOMINAL AORTOGRAM W/LOWER EXTREMITY N/A 04/09/2021   Procedure: ABDOMINAL AORTOGRAM W/LOWER EXTREMITY;  Surgeon: Waynetta Sandy, MD;  Location: [place] [facility];  Service: Cardiovascular;  Laterality: N/A;   AMPUTATION Left 05/10/2016   Procedure: Left Transmetatarsal Amputation;  Surgeon: [name], MD;  Location: [place];  Service: Orthopedics;  Laterality: Left;   AMPUTATION Right 05/16/2018   Procedure: PARTIAL RAY AMPUTATION FIFTH TOE RIGHT FOOT;  Surgeon: [name], DPM;  Location: [place];  Service: Podiatry;  Laterality: Right;   AMPUTATION Right 06/17/2018   Procedure: AMPUTATION 4th RAY Right Foot;  Surgeon: Trula Slade, DPM;  Location: [place];  Service: Podiatry;  Laterality: Right;   AMPUTATION Right 06/19/2018   Procedure: RIGHT BELOW KNEE AMPUTATION;  Surgeon: [name], MD;  Location: [place];  Service: Orthopedics;  Laterality: Right;   AMPUTATION Left 10/05/2021   Procedure: LEFT RING FINGER AMPUTATION;  Surgeon: Daryll Brod, MD;  Location: [place];   Service: Orthopedics;  Laterality: Left;  45 MIN   AMPUTATION Left 11/15/2021   Procedure: LEFT INDEX FINGER AMPUTATION;  Surgeon: Leanora Cover, MD;  Location: [place];  Service: Orthopedics;  Laterality: Left;  60 MIN   AMPUTATION Right 01/08/2022   Procedure: AMPUTATION ABOVE KNEE;  Surgeon: Waynetta Sandy, MD;  Location: [place];  Service: Vascular;  Laterality: Right;   AMPUTATION Bilateral 01/12/2022   Procedure: LEFT INDEX FINGER AMPUTATION, RIGHT INDEX, LONG AND SMALL DIGITS AMPUTATION;  Surgeon: Leanora Cover, MD;  Location: [place];  Service: Orthopedics;  Laterality: Bilateral;   AORTIC ARCH ANGIOGRAPHY N/A 01/07/2022   Procedure: AORTIC ARCH ANGIOGRAPHY;  Surgeon: Waynetta Sandy, MD;  Location: [place] [facility];  Service: Cardiovascular;  Laterality: N/A;   APPLICATION OF WOUND VAC Right 06/19/2018   Procedure: APPLICATION OF WOUND VAC;  Surgeon: [name], MD;  Location: [place];  Service: Orthopedics;  Laterality: Right;   AV FISTULA PLACEMENT Left 11/25/2019   Procedure: LEFT ARM Brachiocephalic  ARTERIOVENOUS (AV) FISTULA CREATION;  Surgeon: Rosetta Posner, MD;  Location: [place];  Service: Vascular;  Laterality: Left;   [place] Left 07/19/2020   Procedure: LEFT SECOND STAGE [place];  Surgeon: Waynetta Sandy, MD;  Location: [place];  Service: Vascular;  Laterality: Left;   CARDIAC CATHETERIZATION  07/29/2018   CIRCUMCISION  09/02/2011   Procedure: CIRCUMCISION ADULT;  Surgeon: Molli Hazard, MD;  Location: [place];  Service: Urology;  Laterality: N/A;   CORONARY STENT INTERVENTION N/A 07/29/2018   Procedure: CORONARY STENT INTERVENTION;  Surgeon: Leonie Man, MD;  Location: [place] [facility];  Service: Cardiovascular;  Laterality: N/A;   DEBRIDEMENT AND CLOSURE WOUND Left 11/15/2021   Procedure: LEFT HAND WOUND CLOSURE;  Surgeon: Leanora Cover, MD;  Location: [place];  Service: Orthopedics;   Laterality: Left;   I & D EXTREMITY  09/02/2011   Procedure: IRRIGATION AND DEBRIDEMENT EXTREMITY;  Surgeon: Theodoro Kos, DO;  Location: [place];  Service: Plastics;  Laterality: N/A;   INCISION AND DRAINAGE OF WOUND  08/12/2011   Procedure: IRRIGATION AND DEBRIDEMENT WOUND;  Surgeon: Zenovia Jarred, MD;  Location: [place];  Service: General;;  perineum   INSERTION OF DIALYSIS CATHETER N/A 11/25/2019   Procedure: INSERTION OF Righ Internal Jugular DIALYSIS CATHETER;  Surgeon: Rosetta Posner, MD;  Location: [place];  Service: Vascular;  Laterality: N/A;   [place]  08/12/2011   Procedure: IRRIGATION AND DEBRIDEMENT ABSCESS;  Surgeon: Molli Hazard, MD;  Location: [place];  Service: Urology;  Laterality: N/A;  irrigation and debridment perineum     IRRIGATION AND DEBRIDEMENT ABSCESS  08/14/2011   Procedure: MINOR INCISION AND DRAINAGE OF ABSCESS;  Surgeon: Molli Hazard, MD;  Location: [place];  Service: Urology;  Laterality: N/A;  Perineal Wound Debridement;Placement Bilateral Testicular Thigh Pouches   LEFT HEART CATH AND CORONARY ANGIOGRAPHY N/A 07/29/2018   Procedure: LEFT HEART CATH AND CORONARY ANGIOGRAPHY;  Surgeon: Leonie Man, MD;  Location: [place] [facility];  Service: Cardiovascular;  Laterality: N/A;   LOWER EXTREMITY ANGIOGRAPHY Bilateral 01/07/2022   Procedure: Lower Extremity Angiography;  Surgeon: Waynetta Sandy, MD;  Location: [place] [facility];  Service: Cardiovascular;  Laterality: Bilateral;   PERIPHERAL VASCULAR ATHERECTOMY Left 04/09/2021   Procedure: PERIPHERAL VASCULAR ATHERECTOMY;  Surgeon: Waynetta Sandy, MD;  Location: [place] [facility];  Service: Cardiovascular;  Laterality: Left;  TP trunk and peroneal arteries   PERIPHERAL VASCULAR BALLOON ANGIOPLASTY Left 04/09/2021   Procedure: PERIPHERAL VASCULAR BALLOON ANGIOPLASTY;  Surgeon: Waynetta Sandy, MD;  Location: [place] [facility];   Service: Cardiovascular;  Laterality: Left;  Popliteal   REVISON OF ARTERIOVENOUS FISTULA Left 05/24/2020   Procedure: LEFT ARM ARTERIOVENOUS FISTULA  CONVERSION TO BASILIC VEIN FISTULA;  Surgeon: Waynetta Sandy, MD;  Location: [place];  Service: Vascular;  Laterality: Left;   TOTAL HIP ARTHROPLASTY Left 10/11/2016   Procedure: LEFT TOTAL HIP ARTHROPLASTY ANTERIOR APPROACH;  Surgeon: Mcarthur Rossetti, MD;  Location: [facility];  Service: Orthopedics;  Laterality: Left;   UPPER EXTREMITY ANGIOGRAPHY Bilateral 01/07/2022   Procedure: Upper Extremity Angiography;  Surgeon: Waynetta Sandy, MD;  Location: [place] [facility];  Service: Cardiovascular;  Laterality: Bilateral;      Social History:     Social History   Tobacco Use   Smoking status: Former    Packs/day: 0.25    Years: 24.00    Total pack years: 6.00    Types: Cigarettes    Quit date: 11/19/2019    Years since quitting: 2.3   Smokeless tobacco: Never  Substance Use Topics   Alcohol use: Not Currently    Alcohol/week: 0.0 standard drinks of alcohol    Comment: quit 2018        Family History :     Family History  Problem Relation Age of Onset   Diabetes Other    Pancreatic cancer Mother 32   Colon cancer Neg Hx    Colon polyps Neg Hx    Esophageal cancer Neg Hx    Rectal cancer Neg Hx    Stomach cancer Neg Hx       Home Medications:   Prior to Admission medications   Medication Sig Start Date End Date Taking? Authorizing Provider  acetaminophen (TYLENOL) 500 MG tablet Take 500-1,000 mg by mouth every 6 (six) hours as needed for moderate pain  or headache.   Yes [provider]  allopurinol (ZYLOPRIM) 100 MG tablet Take 100 mg by mouth daily. 02/01/22  Yes [provider]  aspirin 81 MG chewable tablet Chew 81 mg by mouth daily. 01/24/22  Yes [provider]  atorvastatin (LIPITOR) 80 MG tablet Take 80 mg by mouth in the morning.   Yes [provider]   clopidogrel (PLAVIX) 75 MG tablet TAKE ONE TABLET BY MOUTH ONCE DAILY 02/01/22  Yes Waynetta Sandy, MD  gabapentin (NEURONTIN) 100 MG capsule Take 100 mg by mouth 3 (three) times daily. 12/03/21  Yes [provider]  insulin aspart protamine - aspart (NOVOLOG MIX 70/30 FLEXPEN) (70-30) 100 UNIT/ML FlexPen Inject 0.3 mLs (30 Units total) into the skin 2 (two) times daily with a meal. Patient taking differently: Inject 30 Units into the skin 2 (two) times daily with a meal. 09/29/20  Yes Lavina Hamman, MD  linaclotide [facility]) 72 MCG capsule Take 1 capsule (72 mcg total) by mouth daily before breakfast. Patient taking differently: Take 72 mcg by mouth in the morning. 12/13/20  Yes Armbruster, Carlota Raspberry, MD  pantoprazole (PROTONIX) 40 MG tablet TAKE ONE TABLET BY MOUTH EVERY MORNING and TAKE ONE TABLET BY MOUTH EVERYDAY AT BEDTIME Patient taking differently: Take 40 mg by mouth daily. 02/01/22  Yes Willia Craze, NP  traMADol (ULTRAM) 50 MG tablet Take 50 mg by mouth every 6 (six) hours as needed for pain. 03/22/22  Yes [provider]  triamcinolone ointment (KENALOG) 0.1 % 1 application. daily as needed (rash). 09/04/21  Yes [provider]  VELPHORO 500 MG chewable tablet Chew 1,000 mg by mouth 3 (three) times daily with meals. 06/14/20  Yes [provider]  bismuth subsalicylate (PEPTO BISMOL) 262 MG/15ML suspension Take 30 mLs by mouth every 6 (six) hours as needed for indigestion.    [provider]  blood glucose meter kit and supplies Dispense based on patient and insurance preference. Use up to four times daily as directed. (FOR ICD-9 250.00, 250.01). 07/01/18   Angiulli, Lavon Paganini, PA-C  ezetimibe (ZETIA) 10 MG tablet Take 1 tablet (10 mg total) by mouth daily. 01/17/22   Danford, Suann Larry, MD  Insulin Pen Needle 31G X 5 MM MISC Use daily to inject insulin as instructed 07/01/18   Angiulli, Lavon Paganini, PA-C     Allergies:      Allergies  Allergen Reactions   Crestor [Rosuvastatin] Rash    Also had a red eye   Dilaudid [Hydromorphone] Itching   Benadryl [Diphenhydramine] Rash     Physical Exam:   Vitals  Blood pressure (!) 141/91, pulse 79, temperature (!) 97.5 F (36.4 C), resp. rate 17, height 6' 1"  (1.854 m), weight 97.5 kg, SpO2 100 %.   1. General - middle aged [place] male in PACU bed in no distress  2. Normal affect and insight, Not Suicidal or Homicidal, Awake Alert,   3. No F.N deficits, ALL C.Nerves Intact, Strength 5/5 all 4 extremities, Sensation intact all 4 extremities, Plantars down going.  4. Ears and Eyes appear Normal, Conjunctivae clear, PERRLA. Moist Oral Mucosa.  5. Supple Neck, No JVD, No cervical lymphadenopathy appriciated, No Carotid Bruits.  6. Symmetrical Chest wall movement, Good air movement bilaterally, CTAB.  7. RRR, No Gallops, Rubs or Murmurs, No Parasternal Heave.  8. Positive Bowel Sounds, Abdomen Soft, No tenderness, No organomegaly appriciated,No rebound -guarding or rigidity.  9.  No Cyanosis, Normal Skin Turgor, No Skin Rash  or Bruise.  10. Good muscle tone, right BKA, left transmetatarsal amputation, multiple digit amputations in both upper extremities, right hand has a bandage postop, left arm AV fistula present.  11. No Palpable Lymph Nodes in Neck or Axillae      Data Review:   No results for input(s): "WBC", "HGB", "HCT", "PLT", "MCV", "MCH", "MCHC", "RDW", "LYMPHSABS", "MONOABS", "EOSABS", "BASOSABS", "BANDABS" in the last 168 hours.  Invalid input(s): "NEUTRABS", "BANDSABD"  No results for input(s): "NA", "K", "CL", "CO2", "GLUCOSE", "BUN", "CREATININE", "CALCIUM", "AST", "ALT", "ALKPHOS", "BILITOT", "ALBUMIN", "MG", "PHOS", "CRP", "DDIMER", "PROCALCITON", "LATICACIDVEN", "INR", "TSH", "CORTISOL", "HGBA1C", "AMMONIA", "BNP" in the last 168 hours.  Invalid input(s): "GFRCGP", "ARSCOV2NAA"  Urinalysis    Component Value Date/Time   COLORURINE  YELLOW 09/06/2021 [place] 09/06/2021 0748   LABSPEC >1.030 (H) 09/06/2021 0748   PHURINE 5.5 09/06/2021 0748   GLUCOSEU 100 (A) 09/06/2021 0748   HGBUR MODERATE (A) 09/06/2021 0748   BILIRUBINUR NEGATIVE 09/06/2021 0748   KETONESUR NEGATIVE 09/06/2021 0748   PROTEINUR >300 (A) 09/06/2021 0748   UROBILINOGEN 0.2 05/04/2014 1002   NITRITE NEGATIVE 09/06/2021 0748   LEUKOCYTESUR TRACE (A) 09/06/2021 0748      Imaging Results:    No results found.    Assessment & Plan:    1.  Hand abscess found during elective procedure which was right index finger amputation through MP joint, right index finger and hand incision and drainage done on 04/01/2022 by hand surgeon Dr. Fredna Dow.  He currently does not have any systemic symptoms, he is afebrile, no chills, will be admitted to the hospital for IV antibiotics, have discussed with ID, will order random blood cultures, IV vancomycin and cefepime to be dosed by pharmacy.  MRSA nasal PCR.  ID and hand surgery will continue to monitor.  Follow-up with supportive care.  Likely repeat surgery in the next few days if needed.  2.  PAD with multiple previous temptations including multiple upper extremity digits in both hands, right BKA, left transmetatarsal amputation.  Currently stable, continue dual antiplatelet therapy which is okay to continue per hand surgeon, continue statin.  3.  ESRD.  On TTS schedule.  Case discussed with Dr. Candiss Norse nephrologist will see the patient tomorrow for dialysis.  4.  CAD with ischemic cardiomyopathy, chronic combined systolic and diastolic heart failure last known EF around 45%.  Stable and compensated.  Continue dual antiplatelet therapy, Zetia and statin for secondary prevention, usually has issues with low blood pressure currently blood pressure is stable will challenge with low-dose Coreg and monitor.  No acute issues he is clinically stable.  5.  Dyslipidemia.  Continue combination of statin and  Zetia.  6.  DM type II.  Insulin-dependent.  Continue less than home dose twice daily 7030+ sliding scale.  Check A1c.     DVT Prophylaxis Heparin    AM Labs Ordered, also please review Full Orders  Family Communication: Admission, patients condition and plan of care including tests being ordered have been discussed with the patient who indicates understanding and agree with the plan and Code Status.  Code Status Full  Likely DC to  Home  Condition GUARDED    Consults called: Renal, ID, Hand Surgery    Admission status: Inpt    Time spent in minutes : 35   Lala Lund M.D on 04/01/2022 at 5:15 PM  To page go to [email or web address] - password [email or web address]

## 2022-04-01 NOTE — Transfer of Care (Addendum)
Immediate Anesthesia Transfer of Care Note  Patient: Edward [name]  Procedure(s) Performed: RIGHT INDEX FINGER AMPUTATION AT METACARPAL PHALANGEAL JOINT, INCISION AND DRAINAGE RIGHT HAND (Right)  Patient Location: PACU  Anesthesia Type:MAC and Regional  Level of Consciousness: awake, drowsy, patient cooperative and responds to stimulation  Airway & Oxygen Therapy: Patient Spontanous Breathing and Patient connected to nasal cannula oxygen  Post-op Assessment: Report given to RN and Post -op Vital signs reviewed and stable  Post vital signs: Reviewed and stable  Last Vitals:  Vitals Value Taken Time  BP 136/89 04/01/22 1617  Temp    Pulse    Resp 17 04/01/22 1621  SpO2    Vitals shown include unvalidated device data.  Last Pain:  Vitals:   04/01/22 1400  TempSrc:   PainSc: 0-No pain      Patients Stated Pain Goal: 2 (91/50/56 [id number])  Complications: No notable events documented.

## 2022-04-01 NOTE — Op Note (Signed)
NAME: Edward [name] RECORD NO: [id number] DATE OF BIRTH: [birth year] FACILITY: [facility] LOCATION: [facility] OR PHYSICIAN: Tennis Must, MD   OPERATIVE REPORT   DATE OF PROCEDURE: 04/01/22    PREOPERATIVE DIAGNOSIS: Right index and ring finger finger ischemia and necrosis   POSTOPERATIVE DIAGNOSIS: 1.  Right index and ring finger ischemia and necrosis 2.  Right index finger and palm infection 3.  Right small finger wound infection   PROCEDURE: 1.  Right index finger amputation through MP joint 2.  Right index finger and hand incision and drainage 3.  Irrigation and packing of small finger wound   SURGEON:  Leanora Cover, M.D.   ASSISTANT: none   ANESTHESIA:  Regional with sedation   INTRAVENOUS FLUIDS:  Per anesthesia flow sheet.   ESTIMATED BLOOD LOSS:  Minimal.   COMPLICATIONS:  None.   SPECIMENS: Cultures to micro   TOURNIQUET TIME:    Total Tourniquet Time Documented: Forearm (Right) - 48 minutes Total: Forearm (Right) - 48 minutes    DISPOSITION:  Stable to PACU.   INDICATIONS: 51 year old male with ischemia and necrosis of the index finger amputation stump and of the distal aspect of his ring finger.  He wishes to proceed with amputation of the right index finger through the MP joint and right ring finger at the proximal phalanx or PIP joint.  Risks, benefits and alternatives of surgery were discussed including the risks of blood loss, infection, damage to nerves, vessels, tendons, ligaments, bone for surgery, need for additional surgery, complications with wound healing, continued pain, stiffness.  He voiced understanding of these risks and elected to proceed.  OPERATIVE COURSE:  After being identified preoperatively by myself,  the patient and I agreed on the procedure and site of the procedure.  The surgical site was marked.  Surgical consent had been signed. Preoperative IV antibiotic prophylaxis was given. He was transferred to the operating room and placed  on the operating table in supine position with the Right upper extremity on an arm board.  Sedation was induced by the anesthesiologist. A regional block had been performed by anesthesia in preoperative holding.    Right upper extremity was prepped and draped in normal sterile orthopedic fashion.  A surgical pause was performed between the surgeons, anesthesia, and operating room staff and all were in agreement as to the patient, procedure, and site of procedure.  Tourniquet at the proximal aspect of the forearm was inflated to 250 mmHg after exsanguination of the arm with an Esmarch bandage.  Dried skin was removed from around the stump of the index finger.  There was some necrosis at the volar ulnar aspect of the stump.  The knife was used to make an incision around the edges of the ischemic and nonviable skin.  This was carried in subcutaneous tissues by spreading technique.  Gross purulence was encountered.  Cultures were taken for aerobes and anaerobes.  The incision was extended dorsally.  The volar soft tissues were able to be opened.  The radial and ulnar neurovascular bundles were identified.  The digital nerves were placed under traction bipolared and allowed to retract.  The digital arteries were tied off with 5-0 Monocryl suture and bipolared.  Purulence was removed with the Ray-Tec sponge.  The finger was amputated through the MP joint.  There was purulence coming from more proximal from the subcutaneous tissues.  The wound was extended volarly into the distal aspect of the palm.  Gross purulence was encountered.  The flexor  tendons were within the purulence.  The flexor tendons were transected and removed.  The wound was extended proximally in a Bruner fashion until no more purulence was encountered.  There was also purulence coming from the radial side of the carpal.  This area was accessed both volarly and dorsally.  Purulence was removed with the Ray-Tec sponge.  Devitalized tissues were sharply  removed with a knife including skin subcutaneous tissues and tissue deep to fascia.  The dried skin around the long finger stump was removed.  There was a dried wound distally.  No purulence was noted.  The dried skin was removed around the stump of the small finger.  The wound had dehisced.  There is purulence within the wound.  Decision was made to not amputate the ring finger to prevent contamination of the amputation wound.  The wounds in the index finger and in the small finger were copiously irrigated with sterile saline.  They were packed with half-inch iodoform gauze.  Two 4-0 nylon stitches were placed in the distal skin tube pulled the skin over the end of the index finger metacarpal head.  The wound was opened both volarly and dorsally and was able to be packed through.  The wounds were then dressed with sterile Xeroform and 4 x 4's and wrapped with a Kerlix and Coban dressing lightly.  The tourniquet was deflated at 48 minutes.  Fingertips were pink with brisk capillary refill after deflation of tourniquet.  The operative  drapes were broken down.  The patient was awoken from anesthesia safely.  He was transferred back to the stretcher and taken to PACU in stable condition.  We will ask the hospitalist to admit him for IV antibiotics.   Leanora Cover, MD Electronically signed, 04/01/22

## 2022-04-02 ENCOUNTER — Encounter (HOSPITAL_COMMUNITY): Payer: Self-pay | Admitting: Orthopedic Surgery

## 2022-04-02 DIAGNOSIS — E119 Type 2 diabetes mellitus without complications: Secondary | ICD-10-CM | POA: Diagnosis not present

## 2022-04-02 DIAGNOSIS — Z992 Dependence on renal dialysis: Secondary | ICD-10-CM | POA: Diagnosis not present

## 2022-04-02 DIAGNOSIS — N186 End stage renal disease: Secondary | ICD-10-CM

## 2022-04-02 DIAGNOSIS — L02511 Cutaneous abscess of right hand: Secondary | ICD-10-CM

## 2022-04-02 DIAGNOSIS — L02519 Cutaneous abscess of unspecified hand: Secondary | ICD-10-CM | POA: Diagnosis not present

## 2022-04-02 LAB — COMPREHENSIVE METABOLIC PANEL
ALT: 10 U/L (ref 0–44)
AST: 13 U/L — ABNORMAL LOW (ref 15–41)
Albumin: 2.5 g/dL — ABNORMAL LOW (ref 3.5–5.0)
Alkaline Phosphatase: 82 U/L (ref 38–126)
Anion gap: 22 — ABNORMAL HIGH (ref 5–15)
BUN: 77 mg/dL — ABNORMAL HIGH (ref 6–20)
CO2: 20 mmol/L — ABNORMAL LOW (ref 22–32)
Calcium: 8.8 mg/dL — ABNORMAL LOW (ref 8.9–10.3)
Chloride: 92 mmol/L — ABNORMAL LOW (ref 98–111)
Creatinine, Ser: 11.87 mg/dL — ABNORMAL HIGH (ref 0.61–1.24)
GFR, Estimated: 5 mL/min — ABNORMAL LOW (ref >60)
Glucose, Bld: 106 mg/dL — ABNORMAL HIGH (ref 70–99)
Potassium: 4.5 mmol/L (ref 3.5–5.1)
Sodium: 134 mmol/L — ABNORMAL LOW (ref 135–145)
Total Bilirubin: 0.8 mg/dL (ref 0.3–1.2)
Total Protein: 6.3 g/dL — ABNORMAL LOW (ref 6.5–8.1)

## 2022-04-02 LAB — HEPATITIS B SURFACE ANTIGEN: Hepatitis B Surface Ag: NONREACTIVE

## 2022-04-02 LAB — GLUCOSE, CAPILLARY
Glucose-Capillary: 111 mg/dL — ABNORMAL HIGH (ref 70–99)
Glucose-Capillary: 154 mg/dL — ABNORMAL HIGH (ref 70–99)
Glucose-Capillary: 127 mg/dL — ABNORMAL HIGH (ref 70–99)

## 2022-04-02 LAB — CBC WITH DIFFERENTIAL/PLATELET
Abs Immature Granulocytes: 0.04 10*3/uL (ref 0.00–0.07)
Basophils Absolute: 0 10*3/uL (ref 0.0–0.1)
Basophils Relative: 1 %
Eosinophils Absolute: 0.2 10*3/uL (ref 0.0–0.5)
Eosinophils Relative: 4 %
HCT: 27.1 % — ABNORMAL LOW (ref 39.0–52.0)
Hemoglobin: 9 g/dL — ABNORMAL LOW (ref 13.0–17.0)
Immature Granulocytes: 1 %
Lymphocytes Relative: 13 %
Lymphs Abs: 0.7 10*3/uL (ref 0.7–4.0)
MCH: 30.9 pg (ref 26.0–34.0)
MCHC: 33.2 g/dL (ref 30.0–36.0)
MCV: 93.1 fL (ref 80.0–100.0)
Monocytes Absolute: 0.4 10*3/uL (ref 0.1–1.0)
Monocytes Relative: 8 %
Neutro Abs: 4 10*3/uL (ref 1.7–7.7)
Neutrophils Relative %: 73 %
Platelets: 193 10*3/uL (ref 150–400)
RBC: 2.91 MIL/uL — ABNORMAL LOW (ref 4.22–5.81)
RDW: 16.1 % — ABNORMAL HIGH (ref 11.5–15.5)
WBC: 5.4 10*3/uL (ref 4.0–10.5)
nRBC: 0 % (ref 0.0–0.2)

## 2022-04-02 LAB — POCT I-STAT, CHEM 8
BUN: 77 mg/dL — ABNORMAL HIGH (ref 6–20)
Calcium, Ion: 1.04 mmol/L — ABNORMAL LOW (ref 1.15–1.40)
Chloride: 97 mmol/L — ABNORMAL LOW (ref 98–111)
Creatinine, Ser: 12.3 mg/dL — ABNORMAL HIGH (ref 0.61–1.24)
Glucose, Bld: 61 mg/dL — ABNORMAL LOW (ref 70–99)
HCT: 34 % — ABNORMAL LOW (ref 39.0–52.0)
Hemoglobin: 11.6 g/dL — ABNORMAL LOW (ref 13.0–17.0)
Potassium: 4.4 mmol/L (ref 3.5–5.1)
Sodium: 134 mmol/L — ABNORMAL LOW (ref 135–145)
TCO2: 23 mmol/L (ref 22–32)

## 2022-04-02 LAB — MAGNESIUM: Magnesium: 2.2 mg/dL (ref 1.7–2.4)

## 2022-04-02 MED ORDER — METHYLPREDNISOLONE SODIUM SUCC 40 MG IJ SOLR
20.0000 mg | Freq: Once | INTRAMUSCULAR | Status: AC
  Administered 2022-04-02: 20 mg via INTRAVENOUS
  Filled 2022-04-02: qty 1

## 2022-04-02 MED ORDER — HYDROXYZINE HCL 10 MG PO TABS
10.0000 mg | ORAL_TABLET | Freq: Once | ORAL | Status: AC
  Administered 2022-04-02: 10 mg via ORAL
  Filled 2022-04-02: qty 1

## 2022-04-02 MED ORDER — MORPHINE SULFATE (PF) 4 MG/ML IV SOLN
3.0000 mg | INTRAVENOUS | Status: DC | PRN
  Administered 2022-04-02 – 2022-04-04 (×6): 3 mg via INTRAVENOUS
  Filled 2022-04-02 (×6): qty 1

## 2022-04-02 MED ORDER — ALBUMIN HUMAN 25 % IV SOLN
25.0000 g | Freq: Once | INTRAVENOUS | Status: AC
  Administered 2022-04-02: 25 g via INTRAVENOUS
  Filled 2022-04-02: qty 100

## 2022-04-02 MED ORDER — FAMOTIDINE IN NACL 20-0.9 MG/50ML-% IV SOLN
20.0000 mg | Freq: Once | INTRAVENOUS | Status: AC
  Administered 2022-04-02: 20 mg via INTRAVENOUS
  Filled 2022-04-02: qty 50

## 2022-04-02 MED ORDER — LORATADINE 10 MG PO TABS
10.0000 mg | ORAL_TABLET | Freq: Every day | ORAL | Status: DC
  Administered 2022-04-02 – 2022-04-04 (×3): 10 mg via ORAL
  Filled 2022-04-02 (×4): qty 1

## 2022-04-02 MED ORDER — VANCOMYCIN HCL IN DEXTROSE 1-5 GM/200ML-% IV SOLN
1000.0000 mg | Freq: Once | INTRAVENOUS | Status: AC
  Administered 2022-04-02: 1000 mg via INTRAVENOUS
  Filled 2022-04-02: qty 200

## 2022-04-02 MED ORDER — DARBEPOETIN ALFA 60 MCG/0.3ML IJ SOSY
60.0000 ug | PREFILLED_SYRINGE | INTRAMUSCULAR | Status: DC
  Administered 2022-04-02: 60 ug via INTRAVENOUS
  Filled 2022-04-02: qty 0.3

## 2022-04-02 MED ORDER — DARBEPOETIN ALFA 60 MCG/0.3ML IJ SOSY
60.0000 ug | PREFILLED_SYRINGE | INTRAMUSCULAR | Status: DC
Start: 2022-04-09 — End: 2022-04-02

## 2022-04-02 MED ORDER — MORPHINE SULFATE (PF) 2 MG/ML IV SOLN
2.0000 mg | INTRAVENOUS | Status: DC | PRN
  Administered 2022-04-02 (×2): 2 mg via INTRAVENOUS
  Filled 2022-04-02 (×2): qty 1

## 2022-04-02 MED ORDER — FERRIC CITRATE 1 GM 210 MG(FE) PO TABS
630.0000 mg | ORAL_TABLET | Freq: Three times a day (TID) | ORAL | Status: DC
  Administered 2022-04-03 – 2022-04-05 (×6): 630 mg via ORAL
  Filled 2022-04-02 (×8): qty 3

## 2022-04-02 MED ORDER — ALBUMIN HUMAN 25 % IV SOLN: 12.5000 g | Freq: Once | INTRAVENOUS | Status: DC

## 2022-04-02 NOTE — TOC Initial Note (Addendum)
Transition of Care [facility]) - Initial/Assessment Note    Patient Details  Name: Edward [name] MRN: [id number] Date of Birth: Jun 23, 1971  Transition of Care [facility]) CM/SW Contact:    Marilu Favre, RN Phone Number: 04/02/2022, 10:29 AM  Clinical Narrative:                  Spoke to patient and son Edward [name] at bedside.   Patient from home with both sons, who can provide assistance at home.   Patient already has DME.   PCP is [name]   Patient has transportation to appointments and can get prescriptions filled.    PT/OT recommending HHPT/OT . PAtient states he already has HHPT , son believes name of agency is [name] . NCM has messaged [name] with [facility] to confirm. Awaiting response. [name] responded patient is not active [facility] patient was discharged from [facility] 03/19/22. [name] checking to see if she can accept patient back. Patient wants to continue with [facility] will need updated orders.    Sons have been providing dressing changes at home, and aware home health unable to provide daily visits .    Expected Discharge Plan: [name] to Discharge: Continued Medical Work up   Patient Goals and CMS Choice Patient states their goals for this hospitalization and ongoing recovery are:: to return to home CMS Medicare.gov Compare Post Acute Care list provided to:: Patient Choice offered to / list presented to : Patient  Expected Discharge Plan and Services Expected Discharge Plan: [place]   Discharge Planning Services: CM Consult Post Acute Care Choice: [place] arrangements for the past 2 months: Single Family Home                 DME Arranged: N/A DME Agency: NA       HH Arranged: PT, OT          Prior Living Arrangements/Services Living arrangements for the past 2 months: Single Family Home Lives with:: Adult Children Patient language and need for interpreter reviewed:: Yes Do you feel  safe going back to the place where you live?: Yes      Need for Family Participation in Patient Care: Yes (Comment) Care giver support system in place?: Yes (comment) Current home services: DME Criminal Activity/Legal Involvement Pertinent to Current Situation/Hospitalization: No - Comment as needed  Activities of Daily Living Home Assistive Devices/Equipment: Wheelchair, CBG Meter, Blood pressure cuff, Walker (specify type) ADL Screening (condition at time of admission) Patient's cognitive ability adequate to safely complete daily activities?: Yes Is the patient deaf or have difficulty hearing?: No Patient able to express need for assistance with ADLs?: Yes Does the patient have difficulty dressing or bathing?: Yes Independently performs ADLs?: No Communication: Independent Dressing (OT): Needs assistance Is this a change from baseline?: Pre-admission baseline Grooming: Independent Feeding: Independent Bathing: Needs assistance Is this a change from baseline?: Pre-admission baseline Toileting: Needs assistance Is this a change from baseline?: Pre-admission baseline In/Out Bed: Independent with device (comment) Walks in Home: Independent with device (comment) Does the patient have difficulty walking or climbing stairs?: Yes Weakness of Legs: None Weakness of Arms/Hands: None  Permission Sought/Granted   Permission granted to share information with : Yes, Verbal Permission Granted  Share Information with NAME: son Edward [name]  Permission granted to share info w AGENCY: [facility]        Emotional Assessment Appearance:: Appears stated age Attitude/Demeanor/Rapport: Engaged Affect (typically observed): Accepting  Orientation: : Oriented to Situation, Oriented to  Time, Oriented to Self, Oriented to Place Alcohol / Substance Use: Not Applicable Psych Involvement: No (comment)  Admission diagnosis:  Hand abscess [L02.519] Patient Active Problem List   Diagnosis Date Noted   Hand  abscess 04/01/2022   Severe sepsis (HCC)    Acute metabolic encephalopathy    PAD (peripheral artery disease) ([place]) 01/07/2022   Osteomyelitis of finger of left hand ([place]) 10/04/2021   Junctional rhythm 10/04/2021   Acute cholecystitis 09/27/2020   Elevated LFTs 09/27/2020   TIA (transient ischemic attack) 09/27/2020   Nausea and vomiting    ESRD on hemodialysis ([place]) 06/22/2020   S/P coronary artery stent placement 06/22/2020   Encounter for removal of sutures 03/07/2020   Fluid overload, unspecified 12/29/2019   Hypertensive chronic kidney disease with stage 1 through stage 4 chronic kidney disease, or unspecified chronic kidney disease 11/30/2019   Pruritus, unspecified 11/30/2019   Secondary hyperparathyroidism of renal origin ([place]) 11/30/2019   Unspecified atrial fibrillation ([place]) 11/30/2019   Pain, unspecified 11/30/2019   Other specified arthritis, unspecified site 11/30/2019   Iron deficiency anemia, unspecified 11/30/2019   End stage renal disease ([place]) 11/30/2019   Encounter for screening for respiratory tuberculosis 11/30/2019   Diarrhea, unspecified 11/30/2019   Coagulation defect, unspecified ([place]) 11/30/2019   Atherosclerotic heart disease of native coronary artery without angina pectoris 11/30/2019   Acute renal failure (ARF) ([place]) 11/22/2019   Transaminitis 12/22/2018   Acute exacerbation of CHF (congestive heart failure) ([place]) 12/21/2018   Gait abnormality 09/11/2018   CAD S/P percutaneous coronary angioplasty 08/06/2018   Cardiomyopathy ([place]) 08/06/2018   PVD (peripheral vascular disease) ([place]) 08/06/2018   Shortness of breath    Chronic combined systolic and diastolic CHF (congestive heart failure) ([place])    History of NSTEMI 07/28/2018   Respiratory failure with hypoxia ([place]) 07/28/2018   Acute heart failure ([place]) 07/28/2018   Hypertensive emergency 07/28/2018   S/P BKA (below knee amputation) unilateral, right ([place]) 07/15/2018   Hypoglycemia    Anxiety  about health    Labile blood glucose    Essential hypertension    History of transmetatarsal amputation of left foot ([place])    Hypoalbuminemia due to protein-calorie malnutrition ([place])    Poorly controlled type 2 diabetes mellitus with peripheral neuropathy ([place])    Acute blood loss anemia    Anemia of chronic disease    Leukocytosis    Tobacco abuse    Hyperkalemia 06/20/2018   Diabetic polyneuropathy associated with type 2 diabetes mellitus ([place])    Severe protein-calorie malnutrition ([place])    AKI (acute kidney injury) ([place]) 06/17/2018   Normocytic anemia 06/17/2018   Viral pharyngitis 01/14/2017   Cigarette smoker 01/14/2017   Avascular necrosis of hip, left ([place]) 10/11/2016   Status post left hip replacement 10/11/2016   S/P transmetatarsal amputation of foot, left ([place]) 07/25/2016   Balance problem 07/25/2016   Fournier's gangrene 08/16/2011   Acute respiratory failure ([place]) 08/16/2011   Septic shock(785.52) 08/16/2011   Diabetes mellitus with complication, with long-term current use of insulin ([place]) 08/16/2011   PCP:  [name], MD Pharmacy:   [facility] [place], [place] AT [address] [place] [address] Phone: [phone] Fax: [phone]  [facility] [address] [place] [address] Phone: [phone] Fax: [phone]  [facility] - [place], [place] - [address]  [address] [place] [address] Phone: [phone] Fax: [phone]     Social Determinants of Health (SDOH) Interventions    Readmission Risk Interventions    01/14/2022   12:26 PM  Readmission Risk Prevention Plan  Transportation Screening Complete  PCP or Specialist Appt within 3-5 Days Complete  HRI or [place] Complete  Social Work Consult for [place] Planning/Counseling Complete  Palliative Care  Screening Not Applicable  Medication Review Press photographer) Referral to Pharmacy

## 2022-04-02 NOTE — Evaluation (Signed)
Occupational Therapy Evaluation Patient Details Name: Edward [name] MRN: [id number] DOB: October 07, 1970 Today's Date: 04/02/2022   History of Present Illness Pt is a 51 y/o M presenting with right hand infection, prior amputation of R index finger through proximal phalanx. Now s/p amputation of R index finger through MP point, I&D of R index finger and hand/palm, and irrigation and packing of R small finger wound infection. PMH includes severe PAD s/p left transmetatarsal amputation, right BKA with necrotic stump, multiple amputation in left hand fingers, ESRD on TTS schedule for dialysis, DM type 2 insulin-dependent, dyslipidemia, CAD with MI in past, combined systolic and diastolic CHF EF 50%, orthostatic hypotension of chronic disease, AAA, recent Zio patch placement.   Clinical Impression   PTA, pt lives with sons who assist with ADLs/transfers at baseline. Pt typically performs scoot transfers to/from wheelchair at home though d/t bathroom not w/c accessible, requires assist with RW to enter/exit bathroom. Pt's sons assist with dressing, bathing, toileting and shower transfers at home. Pt presents now with primary limitations being post op pain and impaired use of R hand. Pt requires Mod A for UB ADL and Max A for LB ADLs (bed level/lateral leans). Pt able to complete bed mobility with Modified Independence but declined OOB transfer attempts today. Dicussed likely barriers to being able to perform scoot transfers without use of R UE and consideration of R platform walker to access bathroom at home. Anticipate ability to DC home with [facility] and family assist. However, consider postacute rehab if family unable to provide the necessary assist at DC.       Recommendations for follow up therapy are one component of a multi-disciplinary discharge planning process, led by the attending physician.  Recommendations may be updated based on patient status, additional functional criteria and insurance authorization.    Follow Up Recommendations  Home health OT (If sons unable to continue 24/7 physical assist, will need postacute rehab)    Assistance Recommended at Discharge Frequent or constant Supervision/Assistance  Patient can return home with the following A lot of help with walking and/or transfers;A lot of help with bathing/dressing/bathroom    Functional Status Assessment  Patient has had a recent decline in their functional status and demonstrates the ability to make significant improvements in function in a reasonable and predictable amount of time.  Equipment Recommendations  Other (comment) (TBD pending progress)    Recommendations for Other Services       Precautions / Restrictions Precautions Precautions: Fall Precaution Comments: R AKA, L transmetetarsal amputation, LUE digit amputations. No formal WB restrictions for R UE but advised pt to avoid WB through R hand/wrist as conservative measure Restrictions Weight Bearing Restrictions: No      Mobility Bed Mobility Overal bed mobility: Modified Independent             General bed mobility comments: sit <> supine for peri care and returned to EOB to finish drinking coffee    Transfers                   General transfer comment: declined d/t pain despite recent pain premedication      Balance Overall balance assessment: Needs assistance Sitting-balance support: Feet supported, No upper extremity supported Sitting balance-Leahy Scale: Good                                     ADL either performed or assessed with  clinical judgement   ADL Overall ADL's : Needs assistance/impaired Eating/Feeding: Set up;Sitting Eating/Feeding Details (indicate cue type and reason): able to use regular utensil with L UE. difficulty opening small containers (coffee creamer) Grooming: Moderate assistance;Sitting Grooming Details (indicate cue type and reason): discussed strategies for brushing teeth, etc to  maximize independence with basic tasks Upper Body Bathing: Moderate assistance;Sitting   Lower Body Bathing: Maximal assistance;Bed level;Sitting/lateral leans Lower Body Bathing Details (indicate cue type and reason): assist bed level to clean posterior region. pt able to turn well to assist though requires increased assist d/t to recent impairment of RUE Upper Body Dressing : Moderate assistance;Sitting   Lower Body Dressing: Maximal assistance;Sitting/lateral leans;Bed level       Toileting- Clothing Manipulation and Hygiene: Maximal assistance;Bed level         General ADL Comments: increased assist needed with basic tasks d/t inability to functionally use R hand and pain. Discussed barriers to PLOF and problem solving transfer abilities (scoot vs R platform walker if using this to get into bathroom)     Vision Ability to See in Adequate Light: 0 Adequate Patient Visual Report: No change from baseline Vision Assessment?: No apparent visual deficits     Perception     Praxis      Pertinent Vitals/Pain Pain Assessment Pain Assessment: Faces Faces Pain Scale: Hurts even more Pain Location: R hand Pain Descriptors / Indicators: Constant, Discomfort, Grimacing, Guarding Pain Intervention(s): Monitored during session, Premedicated before session, Repositioned     Hand Dominance Right (but using L UE primarily since R hand surgery)   Extremity/Trunk Assessment Upper Extremity Assessment Upper Extremity Assessment: RUE deficits/detail;LUE deficits/detail RUE Deficits / Details: hand wrapped tightly w/ large bandage, only thumb and 5th digit exposed, difficulty using this hand functionally. s/p recent amputation noted above RUE Coordination: decreased fine motor LUE Deficits / Details: 2 digits still intact with all other digits amputated, able to hold regular utensil with this UE though coordination deficits remain LUE Coordination: decreased fine motor   Lower Extremity  Assessment Lower Extremity Assessment: Defer to PT evaluation RLE Deficits / Details: R AKA LLE Sensation: decreased light touch;decreased proprioception;history of peripheral neuropathy (pt reports numbness, tingling, burning, and itching in L foot and occasional numbness/tingling in bilateral hands)   Cervical / Trunk Assessment Cervical / Trunk Assessment: Normal   Communication Communication Communication: No difficulties   Cognition Arousal/Alertness: Awake/alert Behavior During Therapy: WFL for tasks assessed/performed Overall Cognitive Status: Within Functional Limits for tasks assessed                                       General Comments  Itchiness reports continue    Exercises     Shoulder Instructions      Home Living Family/patient expects to be discharged to:: Private residence Living Arrangements: Children Available Help at Discharge: Family;Available 24 hours/day Type of Home: House Home Access: Stairs to enter [employer] of Steps: 1 to get onto porch and 1 to get into house Entrance Stairs-Rails: Right;Left Home Layout: One level     Bathroom Shower/Tub: Occupational psychologist: Handicapped height Bathroom Accessibility: Yes How Accessible: Accessible via walker Home Equipment: Conservation officer, nature (2 wheels);Wheelchair - manual;Shower seat;BSC/3in1;Cane - single point;Hand held shower head   Additional Comments: now has R AKA, in process of working with [employer] to get new prosthetic. Pt also reports he is trying to  get a power WC.      Prior Functioning/Environment Prior Level of Function : Needs assist       Physical Assist : Mobility (physical);ADLs (physical) Mobility (physical): Transfers ADLs (physical): Dressing;Bathing;Toileting Mobility Comments: Primarily performs scoot transfers to/from wheelchair and bed. Bathroom is not w/c accessible, so pt reports sons assisting with mobility using RW in/out of  bathroom. Pt reports sons recently placed BSC beside bed for toileting tasks ADLs Comments: Assist to transfer in/out of shower but able to mostly bathe self. assist for dressing, toilet transfers and toileting assist        OT Problem List: Decreased strength;Impaired balance (sitting and/or standing);Decreased coordination;Decreased knowledge of use of DME or AE;Decreased knowledge of precautions;Pain;Impaired UE functional use      OT Treatment/Interventions: Self-care/ADL training;Therapeutic exercise;DME and/or AE instruction;Therapeutic activities    OT Goals(Current goals can be found in the care plan section) Acute Rehab OT Goals Patient Stated Goal: pain control OT Goal Formulation: With patient Time For Goal Achievement: 04/16/22 Potential to Achieve Goals: Good  OT Frequency: Min 2X/week    Co-evaluation              AM-PAC OT "6 Clicks" Daily Activity     Outcome Measure Help from another person eating meals?: A Little Help from another person taking care of personal grooming?: A Little Help from another person toileting, which includes using toliet, bedpan, or urinal?: A Lot Help from another person bathing (including washing, rinsing, drying)?: A Lot Help from another person to put on and taking off regular upper body clothing?: A Lot Help from another person to put on and taking off regular lower body clothing?: A Lot 6 Click Score: 14   End of Session    Activity Tolerance: Patient tolerated treatment well;Patient limited by pain Patient left: in bed;with call bell/phone within reach  OT Visit Diagnosis: Other abnormalities of gait and mobility (R26.89);Pain Pain - Right/Left: Right Pain - part of body: Hand                Time: 5686-1683 OT Time Calculation (min): 22 min Charges:  OT General Charges $OT Visit: 1 Visit OT Evaluation $OT Eval Moderate Complexity: 1 Mod  Malachy Chamber, OTR/L Acute Rehab Services Office: [phone]   Layla Maw 04/02/2022, 9:54 AM

## 2022-04-02 NOTE — Consult Note (Addendum)
[facility]    Date of Admission:  04/01/2022     Reason for Consult: right hand abscess    Referring Provider: Lala Lund      Abx: 8/14-c vanc 8/14-c cefepime        Assessment: 51 yo male with PAD, esrd on iHD via left UE AVF, dm2, cad, chf, dry gangrene of right of ex s/p right aka, left tma, several fingers, an dmost recently right index finger by mtp disarticulation 8/14 found to have an abscess at that area so admitted for abx  Patient at this time also has the right hand ring finger dry gangrene  Orthopedics team planning repeat surgery this week  admitted again with right hand abscess  His left hand/tma stump and right aka stump don't seem to have any infectious issue at this time, despite several calcified ulcers on LLE and left hand  He doesn't appear septic at this time. Bcx ngtd. The 8/14 wound cx is growing gpc and gnr   Plan: Continue vanc and cefepime for now pending final wound cx report Await further surgery decision on right hand I&D and the ischemic gangrene 4th finger At some point I might consider mri right hand to see if osseous involvement and to help determine abx duration Will trend crp as well Discussed with primary team    I spent 75 minute reviewing data/chart, and coordinating care and >50% direct face to face time providing counseling/discussing diagnostics/treatment plan with patient   ------------------------------------------------ Principal Problem:   Hand abscess Active Problems:   Diabetes mellitus with complication, with long-term current use of insulin (HCC)   S/P transmetatarsal amputation of foot, left (HCC)   Diabetic polyneuropathy associated with type 2 diabetes mellitus ([place])   Anemia of chronic disease   S/P BKA (below knee amputation) unilateral, right (HCC)   Chronic combined systolic and diastolic CHF (congestive heart failure) ([place])   CAD S/P percutaneous coronary  angioplasty   End stage renal disease ([place])   PAD (peripheral artery disease) ([place])    HPI: Edward [name] is a 51 y.o. male with dm, esrd, pad with several fingers amputation, left chopart amputation, right aka, admitted 8/14 after abscess found right hand for right index elective MCP joint disarticulation for dry gangrene  Patient had felt well otherwise. Surgery was elective Denies f/c Denies purulent discharge  Further surgery planned for right hand  Tolerating vanc/cefepime  No recent abx Wbc on admission 5.4. hemodynamics stable Nonsmoker (quit 2 years prior to this admission)  All other ros negative    Family History  Problem Relation Age of Onset   Diabetes Other    Pancreatic cancer Mother 2   Colon cancer Neg Hx    Colon polyps Neg Hx    Esophageal cancer Neg Hx    Rectal cancer Neg Hx    Stomach cancer Neg Hx     Social History   Tobacco Use   Smoking status: Former    Packs/day: 0.25    Years: 24.00    Total pack years: 6.00    Types: Cigarettes    Quit date: 11/19/2019    Years since quitting: 2.3   Smokeless tobacco: Never  Vaping Use   Vaping Use: Never used  Substance Use Topics   Alcohol use: Not Currently    Alcohol/week: 0.0 standard drinks of alcohol    Comment: quit 2018   Drug use: No    Allergies  Allergen Reactions   Crestor [Rosuvastatin] Rash    Also had a red eye   Dilaudid [Hydromorphone] Itching   Benadryl [Diphenhydramine] Rash    Review of Systems: ROS All Other ROS was negative, except mentioned above   Past Medical History:  Diagnosis Date   Allergy    Anemia    getting Venofer with dialysis   Anxiety    self reported, sometimes-situational anxiety   Arthritis    bilateral hands/LEFT hip   CHF (congestive heart failure) (HCC)    Chronic kidney disease    t th s dialysis- [place] ---ESRD   Coronary artery disease    Depression    self reported, sometimes-situational depression   Diabetes mellitus    type  II- on meds   Dyspnea    sometimes- fluid   GERD (gastroesophageal reflux disease)    on meds   History of blood transfusion    Hypertension    no longer on medication since starting on Hemodialysis   Myocardial infarction ([place]) 2019   Peripheral vascular disease ([place])    Sleep apnea        Scheduled Meds:  allopurinol  100 mg Oral Daily   vitamin C  1,000 mg Oral Daily   aspirin  81 mg Oral Daily   atorvastatin  80 mg Oral q AM   carvedilol  3.125 mg Oral BID WC   clopidogrel  75 mg Oral Daily   darbepoetin (ARANESP) injection - DIALYSIS  60 mcg Intravenous Q Tue-HD   ezetimibe  10 mg Oral Daily   ferric citrate  630 mg Oral TID WC   gabapentin  100 mg Oral TID   heparin injection (subcutaneous)  5,000 Units Subcutaneous Q8H   insulin aspart  0-5 Units Subcutaneous QHS   insulin aspart protamine- aspart  15 Units Subcutaneous BID WC   linaclotide  72 mcg Oral q AM   loratadine  10 mg Oral Daily   pantoprazole  40 mg Oral Daily   traMADol  50 mg Oral Q6H   vancomycin variable dose per unstable renal function (pharmacist dosing)   Does not apply See admin instructions   Continuous Infusions:  anticoagulant sodium citrate     ceFEPime (MAXIPIME) IV 1 g (04/01/22 2239)   PRN Meds:.acetaminophen, alteplase, anticoagulant sodium citrate, heparin, heparin, lidocaine (PF), lidocaine-prilocaine, magnesium hydroxide, morphine injection, ondansetron **OR** ondansetron (ZOFRAN) IV, pentafluoroprop-tetrafluoroeth   OBJECTIVE: Blood pressure 115/75, pulse 79, temperature 98.4 F (36.9 C), temperature source Oral, resp. rate 17, height '6\' 1"'$  (1.854 m), weight 97.5 kg, SpO2 100 %.  Physical Exam General/constitutional: no distress, pleasant HEENT: Normocephalic, PER, Conj Clear, EOMI, Oropharynx clear Neck supple CV: rrr no mrg Lungs: clear to auscultation, normal respiratory effort Abd: Soft, Nontender Ext: no edema Skin/msk; multiple small calcified ulcer on left lower ext  and left distal metacarpal areas of prior amputated fingers (left index/small finger left); right hand dressing c/d (ischemic ring finger and thumb left); right aka stump no rash/ulcer; left chopart stump no rash/ulcer Neuro: nonfocal  Lab Results Lab Results  Component Value Date   WBC 5.4 04/02/2022   HGB 9.0 (L) 04/02/2022   HCT 27.1 (L) 04/02/2022   MCV 93.1 04/02/2022   PLT 193 04/02/2022    Lab Results  Component Value Date   CREATININE 11.87 (H) 04/02/2022   BUN 77 (H) 04/02/2022   NA 134 (L) 04/02/2022   K 4.5 04/02/2022   CL 92 (L) 04/02/2022   CO2 20 (L) 04/02/2022  Lab Results  Component Value Date   ALT 10 04/02/2022   AST 13 (L) 04/02/2022   ALKPHOS 82 04/02/2022   BILITOT 0.8 04/02/2022      Microbiology: Recent Results (from the past 240 hour(s))  Aerobic/Anaerobic Culture w Gram Stain (surgical/deep wound)     Status: None (Preliminary result)   Collection Time: 04/01/22  3:33 PM   Specimen: PATH Other; Tissue  Result Value Ref Range Status   Specimen Description FINGER  Final   Special Requests RIGHT INDEX FINGER  Final   Gram Stain   Final    RARE WBC PRESENT,BOTH PMN AND MONONUCLEAR RARE GRAM POSITIVE COCCI    Culture   Final    RARE GRAM NEGATIVE RODS CULTURE REINCUBATED FOR BETTER GROWTH Performed at [facility], [place] [address] [place], [place] [address]    Report Status PENDING  Incomplete  Culture, blood (Routine X 2) w Reflex to ID Panel     Status: None (Preliminary result)   Collection Time: 04/01/22  9:26 PM   Specimen: BLOOD  Result Value Ref Range Status   Specimen Description BLOOD SITE NOT SPECIFIED  Final   Special Requests   Final    BOTTLES DRAWN AEROBIC AND ANAEROBIC Blood Culture results may not be optimal due to an inadequate volume of blood received in culture bottles   Culture   Final    NO GROWTH < 12 HOURS Performed at [facility], [place] [address], [place], [place] [address]    Report Status PENDING   Incomplete     Serology:    Imaging: If present, new imagings (plain films, ct scans, and mri) have been personally visualized and interpreted; radiology reports have been reviewed. Decision making incorporated into the Impression / Recommendations.    Jabier Mutton, [facility] [place] [phone] pager    04/02/2022, 2:13 PM

## 2022-04-02 NOTE — Progress Notes (Addendum)
Received patient in bed to unit. With ongoing HD treatment.  Alert and oriented.  Informed consent signed and in  chart.   Treatment initiated: 1530 Treatment completed: 1948  Patient tolerated well.  Transported back to the room  alert, without acute distress.  Hand-off given to patient's nurse.   Access used: Left AVF Access issues: None  Total UF removed: 3031m Medication(s) given: Albumin 25% 25g, Aranesp 60 mcg Post HD VS: BP 150/814mg, HR 86bpm, RR 11brpm, T 97.10F , Sats 97% RA Post HD weight: 98.8kg   [facility]

## 2022-04-02 NOTE — Consult Note (Signed)
ESRD Consult Note  Assessment/Recommendations:   ESRD:  -Outpatient HD orders:[facility].  4.5 hours.  F180.  Flow rates: 425/700.  2K/2 Cal.  LUE AVF.  Meds: Mircera 75 mcg every 2 weeks (last dose 8/1).  Heparin: 8000 units q. treatment, 2000 units mid run -HD planned for today, will keep on TTS schedule  Hand abscess -I&D 8/14, antibiotics per primary service.  Will likely need repeat surgery.  ID and hand surgery following  Volume/ hypertension: BP currently acceptable, will UF as tolerated  Anemia of Chronic Kidney Disease: Hemoglobin 9, likely related to surgery.  Due for ESA this week, will start Aranesp 8/15. Avoid IV Fe for now  Secondary Hyperparathyroidism/Hyperphosphatemia: on velphoro at home.  Will start [place] here. Check PO4  Generalized skin itching -Occurred after receiving Vicodin.  Per primary  DM2, insulin-dependent -Per primary  History of Present Illness: Edward [name] is a/an 51 y.o. male with a past medical history of ESRD on HD, severe PAD status post left TMA/right BKA, multiple amputations left hand, DM 2, CAD, ischemic cardiomyopathy with EF of 45%, AAA who presents with hand abscess which was found during his elective right digit amputation, underwent I&D on 8/14 and is now on antibiotics.  Patient seen and examined in room today.  Reports that he developed a generalized rash with itchiness after receiving Vicodin (which has not been stopped).  He otherwise reports feeling well, does have some soreness in his right hand.   Medications:  Current Facility-Administered Medications  Medication Dose Route Frequency Provider Last Rate Last Admin   acetaminophen (TYLENOL) tablet 650 mg  650 mg Oral Q6H PRN Thurnell Lose, MD       Or   acetaminophen (TYLENOL) suppository 650 mg  650 mg Rectal Q6H PRN Thurnell Lose, MD       acetaminophen (TYLENOL) tablet 325-650 mg  325-650 mg Oral Q6H PRN Leanora Cover, MD       allopurinol (ZYLOPRIM) tablet 100 mg  100  mg Oral Daily Thurnell Lose, MD   100 mg at 04/02/22 0853   alteplase (CATHFLO ACTIVASE) injection 2 mg  2 mg Intracatheter Once PRN Leanora Cover, MD       anticoagulant sodium citrate solution 5 mL  5 mL Dialysis PRN Leanora Cover, MD       ascorbic acid (VITAMIN C) tablet 1,000 mg  1,000 mg Oral Daily Leanora Cover, MD   1,000 mg at 04/02/22 4098   aspirin chewable tablet 81 mg  81 mg Oral Daily Thurnell Lose, MD   81 mg at 04/02/22 0853   atorvastatin (LIPITOR) tablet 80 mg  80 mg Oral q AM Thurnell Lose, MD   80 mg at 04/02/22 0852   carvedilol (COREG) tablet 3.125 mg  3.125 mg Oral BID WC Lala Lund K, MD   3.125 mg at 04/02/22 0853   ceFEPIme (MAXIPIME) 1 g in sodium chloride 0.9 % 100 mL IVPB  1 g Intravenous Q24H Lala Lund K, MD 200 mL/hr at 04/01/22 2239 1 g at 04/01/22 2239   clopidogrel (PLAVIX) tablet 75 mg  75 mg Oral Daily Thurnell Lose, MD   75 mg at 04/02/22 0853   ezetimibe (ZETIA) tablet 10 mg  10 mg Oral Daily Thurnell Lose, MD   10 mg at 04/02/22 0853   gabapentin (NEURONTIN) capsule 100 mg  100 mg Oral TID Thurnell Lose, MD   100 mg at 04/02/22 0853   heparin injection  1,000 Units  1,000 Units Intracatheter PRN Leanora Cover, MD       heparin injection 5,000 Units  5,000 Units Dialysis PRN Leanora Cover, MD       heparin injection 5,000 Units  5,000 Units Subcutaneous Q8H Thurnell Lose, MD   5,000 Units at 04/02/22 0507   HYDROmorphone (DILAUDID) injection 1 mg  1 mg Intravenous Q3H PRN Thurnell Lose, MD       insulin aspart (novoLOG) injection 0-5 Units  0-5 Units Subcutaneous QHS Thurnell Lose, MD       insulin aspart protamine- aspart (NOVOLOG MIX 70/30) injection 15 Units  15 Units Subcutaneous BID WC Thurnell Lose, MD       lactated ringers infusion   Intravenous Continuous Leanora Cover, MD 50 mL/hr at 04/01/22 2034 New Bag at 04/01/22 2034   lidocaine (PF) (XYLOCAINE) 1 % injection 5 mL  5 mL Intradermal PRN Leanora Cover, MD        lidocaine-prilocaine (EMLA) cream 1 Application  1 Application Topical PRN Leanora Cover, MD       linaclotide [facility]) capsule 72 mcg  72 mcg Oral q AM Thurnell Lose, MD   72 mcg at 04/02/22 0852   loratadine (CLARITIN) tablet 10 mg  10 mg Oral Daily Thurnell Lose, MD   10 mg at 04/02/22 0853   magnesium hydroxide (MILK OF MAGNESIA) suspension 30 mL  30 mL Oral Daily PRN Thurnell Lose, MD       morphine (PF) 2 MG/ML injection 2 mg  2 mg Intravenous Q2H PRN Thurnell Lose, MD   2 mg at 04/02/22 0859   ondansetron (ZOFRAN) tablet 4 mg  4 mg Oral Q6H PRN Thurnell Lose, MD       Or   ondansetron [facility]) injection 4 mg  4 mg Intravenous Q6H PRN Thurnell Lose, MD       pantoprazole (PROTONIX) EC tablet 40 mg  40 mg Oral Daily Thurnell Lose, MD   40 mg at 04/02/22 5852   pentafluoroprop-tetrafluoroeth (GEBAUERS) aerosol 1 Application  1 Application Topical PRN Leanora Cover, MD       traMADol [facility]) tablet 50 mg  50 mg Oral Q6H Leanora Cover, MD   50 mg at 04/02/22 0506   vancomycin variable dose per unstable renal function (pharmacist dosing)   Does not apply See admin instructions Thurnell Lose, MD         ALLERGIES Crestor [rosuvastatin], Dilaudid [hydromorphone], and Benadryl [diphenhydramine]  MEDICAL HISTORY Past Medical History:  Diagnosis Date   Allergy    Anemia    getting Venofer with dialysis   Anxiety    self reported, sometimes-situational anxiety   Arthritis    bilateral hands/LEFT hip   CHF (congestive heart failure) ([place])    Chronic kidney disease    t th s dialysis- [place] ---ESRD   Coronary artery disease    Depression    self reported, sometimes-situational depression   Diabetes mellitus    type II- on meds   Dyspnea    sometimes- fluid   GERD (gastroesophageal reflux disease)    on meds   History of blood transfusion    Hypertension    no longer on medication since starting on Hemodialysis   Myocardial infarction [facility]) 2019    Peripheral vascular disease ([place])    Sleep apnea      SOCIAL HISTORY Social History   Socioeconomic History   Marital status: Widowed  Spouse name: Not on file   Number of children: Not on file   Years of education: Not on file   Highest education level: Not on file  Occupational History   Occupation: Disabled  Tobacco Use   Smoking status: Former    Packs/day: 0.25    Years: 24.00    Total pack years: 6.00    Types: Cigarettes    Quit date: 11/19/2019    Years since quitting: 2.3   Smokeless tobacco: Never  Vaping Use   Vaping Use: Never used  Substance and Sexual Activity   Alcohol use: Not Currently    Alcohol/week: 0.0 standard drinks of alcohol    Comment: quit 2018   Drug use: No   Sexual activity: Not Currently    Birth control/protection: Condom  Other Topics Concern   Not on file  Social History Narrative   Lives w/ his 2 sons.   Social Determinants of Health   Financial Resource Strain: Not on file  Food Insecurity: Not on file  Transportation Needs: Not on file  Physical Activity: Not on file  Stress: Not on file  Social Connections: Not on file  Intimate Partner Violence: Not on file     FAMILY HISTORY Family History  Problem Relation Age of Onset   Diabetes Other    Pancreatic cancer Mother 25   Colon cancer Neg Hx    Colon polyps Neg Hx    Esophageal cancer Neg Hx    Rectal cancer Neg Hx    Stomach cancer Neg Hx      Review of Systems: 12 systems were reviewed and negative except per HPI  Physical Exam: Vitals:   04/02/22 0528 04/02/22 0827  BP: 138/83 115/75  Pulse: 84 79  Resp: 18 17  Temp: 98.2 F (36.8 C) 98.4 F (36.9 C)  SpO2: 100% 100%   No intake/output data recorded.  Intake/Output Summary (Last 24 hours) at 04/02/2022 1113 Last data filed at 04/02/2022 2952 Gross per 24 hour  Intake 1778.83 ml  Output 500 ml  Net 1278.83 ml   General: well-appearing, no acute distress, sitting up in bed HEENT: anicteric  sclera, MMM CV: RRR no murmurs Lungs: CTA BL, bilateral chest rise, normal wob Abd: soft, non-tender, non-distended Ext: right hand dressing in place, rt bka, left hand digit amputation, left TMA, no sig edema Neuro: normal speech, no gross focal deficits  Dialysis access: LUE AVF +b/t  Test Results Reviewed Lab Results  Component Value Date   NA 134 (L) 04/02/2022   K 4.5 04/02/2022   CL 92 (L) 04/02/2022   CO2 20 (L) 04/02/2022   BUN 77 (H) 04/02/2022   CREATININE 11.87 (H) 04/02/2022   CALCIUM 8.8 (L) 04/02/2022   ALBUMIN 2.5 (L) 04/02/2022   PHOS 4.3 01/14/2022    I have reviewed relevant outside healthcare records

## 2022-04-02 NOTE — Evaluation (Signed)
Physical Therapy Evaluation Patient Details Name: Edward [name] MRN: [id number] DOB: 1970-11-29 Today's Date: 04/02/2022  History of Present Illness  Pt is a 51 y/o M presenting with right hand infection, prior amputation of R index finger through proximal phalanx. Now s/p amputation of R index finger through MP point, I&D of R index finger and hand/palm, and irrigation and packing of R small finger wound infection. PMH includes severe PAD s/p left transmetatarsal amputation, right BKA with necrotic stump, multiple amputation in left hand fingers, ESRD on TTS schedule for dialysis, DM type 2 insulin-dependent, dyslipidemia, CAD with MI in past, combined systolic and diastolic CHF EF 94%, orthostatic hypotension of chronic disease, AAA, recent Zio patch placement.  Clinical Impression  Received pt sitting EOB, pt limited by pain and complains of "itchiness" all over his body - RN notified. Pt performed bed mobility mod I with intermittent use of bedrails but politely declined OOB transfer to recliner. Pt reports living with his 2 sons who were providing physical assist with mobility (transfers) and ADLs prior to admission. Pt reports being non-ambulatory at baseline, primarily transferring via lateral scoots. Pt is in the process of getting a R prosthetic (with [employer]) and currently has a manual WC but hopes to receive power WC. Pt will continue to require 27/4 assist upon discharge. Recommend HHPT at this time due to current deficits. Acute PT to cont to follow.      Recommendations for follow up therapy are one component of a multi-disciplinary discharge planning process, led by the attending physician.  Recommendations may be updated based on patient status, additional functional criteria and insurance authorization.  Follow Up Recommendations Home health PT      Assistance Recommended at Discharge Frequent or constant Supervision/Assistance  Patient can return home with the following  A little  help with walking and/or transfers;A little help with bathing/dressing/bathroom;Assistance with cooking/housework;Assist for transportation;Help with stairs or ramp for entrance    Equipment Recommendations Other (comment) (pt already has all equipment needed)  Recommendations for Other Services  OT consult    Functional Status Assessment Patient has had a recent decline in their functional status and demonstrates the ability to make significant improvements in function in a reasonable and predictable amount of time.     Precautions / Restrictions Precautions Precautions: Fall Precaution Comments: R AKA, L transmetetarsal amputation, LUE digit amputations. No formal WB restrictions, however caution to avoid bearing weight through RUE due to recent surgery. Restrictions Weight Bearing Restrictions: No      Mobility  Bed Mobility Overal bed mobility: Modified Independent             General bed mobility comments: pt transferred sit<>supine and supine<>sit from flat bed with intermittent use of bedrails and with increased time Patient Response: Cooperative  Transfers                   General transfer comment: pt declined lateral scoot to drop arm recliner due to pain    Ambulation/Gait               General Gait Details: pt not ambulatory at baseline  Stairs            Wheelchair Mobility    Modified Rankin (Stroke Patients Only)       Balance       Sitting balance - Comments: pt able to maintain static sitting balance with intermittent support of LLE and LUE independently. Required supervision for dynamic sitting balance when bending  forward to reach L foot to show therapist where he was itching.                                     Pertinent Vitals/Pain Pain Assessment Pain Assessment: 0-10 Pain Score: 7  Pain Location: R hand Pain Descriptors / Indicators: Constant, Discomfort, Grimacing, Guarding Pain Intervention(s):  Limited activity within patient's tolerance, Monitored during session, Repositioned, Patient requesting pain meds-RN notified    Home Living Family/patient expects to be discharged to:: Private residence Living Arrangements: Children (2 sons) Available Help at Discharge: Family;Available 24 hours/day Type of Home: House Home Access: Stairs to enter Entrance Stairs-Rails: Right;Left (too far to reach both) Entrance Stairs-Number of Steps: 1 to get onto porch and 1 to get into house   Home Layout: One level Home Equipment: Conservation officer, nature (2 wheels);Wheelchair - manual;Shower seat;BSC/3in1;Cane - single point Additional Comments: now has R AKA, in prcess of working with [employer] to get new prosthetic. Pt also reports he is trying to get a power WC.    Prior Function Prior Level of Function : Needs assist       Physical Assist : Mobility (physical);ADLs (physical) Mobility (physical): Transfers ADLs (physical): Dressing;Bathing Mobility Comments: pt reports performing transfers only at baseline (primarily lateral scoots). pt reported needing assit from sons for transfers and all ADLs       Hand Dominance   Dominant Hand: Left (due to recent surgery on RUE)    Extremity/Trunk Assessment   Upper Extremity Assessment Upper Extremity Assessment: Defer to OT evaluation    Lower Extremity Assessment Lower Extremity Assessment: Generalized weakness;LLE deficits/detail;RLE deficits/detail RLE Deficits / Details: R AKA LLE Sensation: decreased light touch;decreased proprioception;history of peripheral neuropathy (pt reports numbness, tingling, burning, and itching in L foot and occasional numbness/tingling in bilateral hands)    Cervical / Trunk Assessment Cervical / Trunk Assessment: Kyphotic (mild)  Communication   Communication: No difficulties  Cognition Arousal/Alertness: Awake/alert Behavior During Therapy: WFL for tasks assessed/performed Overall Cognitive Status: Within  Functional Limits for tasks assessed                                 General Comments: pt required increased time to understand some of the questions therapist asked        General Comments General comments (skin integrity, edema, etc.): Pt reported being "itchy" all over (suspects it's due to medication) - RN notified    Exercises     Assessment/Plan    PT Assessment Patient needs continued PT services  PT Problem List Decreased strength;Decreased range of motion;Decreased activity tolerance;Decreased balance;Decreased mobility;Decreased coordination;Impaired sensation;Pain       PT Treatment Interventions DME instruction;Functional mobility training;Therapeutic activities;Therapeutic exercise;Balance training;Neuromuscular re-education;Patient/family education;Wheelchair mobility training    PT Goals (Current goals can be found in the Care Plan section)  Acute Rehab PT Goals Patient Stated Goal: did not state PT Goal Formulation: With patient Time For Goal Achievement: 04/09/22 Potential to Achieve Goals: Good    Frequency Min 3X/week     Co-evaluation               AM-PAC PT "6 Clicks" Mobility  Outcome Measure Help needed turning from your back to your side while in a flat bed without using bedrails?: None Help needed moving from lying on your back to sitting on the side of a  flat bed without using bedrails?: A Little Help needed moving to and from a bed to a chair (including a wheelchair)?: A Lot Help needed standing up from a chair using your arms (e.g., wheelchair or bedside chair)?: A Lot Help needed to walk in hospital room?: Total Help needed climbing 3-5 steps with a railing? : Total 6 Click Score: 13    End of Session   Activity Tolerance: Patient limited by pain Patient left: in bed;with call bell/phone within reach Nurse Communication: Mobility status PT Visit Diagnosis: Unsteadiness on feet (R26.81);Other abnormalities of gait and  mobility (R26.89);Muscle weakness (generalized) (M62.81);Pain Pain - Right/Left: Right Pain - part of body: Hand    Time: 1165-7903 PT Time Calculation (min) (ACUTE ONLY): 16 min   Charges:   PT Evaluation $PT Eval Moderate Complexity: 1 Mod          Becky Sax PT, DPT  Blenda Nicely 04/02/2022, 8:13 AM

## 2022-04-02 NOTE — Inpatient Diabetes Management (Addendum)
Inpatient Diabetes Program Recommendations  AACE/ADA: New Consensus Statement on Inpatient Glycemic Control (2015)  Target Ranges:  Prepandial:   less than 140 mg/dL      Peak postprandial:   less than 180 mg/dL (1-2 hours)      Critically ill patients:  140 - 180 mg/dL   Lab Results  Component Value Date   GLUCAP 111 (H) 04/02/2022   HGBA1C 6.5 (H) 01/07/2022    Latest Reference Range & Units 04/01/22 11:51 04/01/22 13:22 04/01/22 14:29 04/01/22 15:02 04/01/22 16:29 04/01/22 16:32 04/01/22 22:30 04/02/22 08:29  Glucose-Capillary 70 - 99 mg/dL 72 Dextrose 25  112 (H) 80 58 (L) Dextrose 25 60 (L) 71 190 (H) 111 (H)  (H): Data is abnormally high (L): Data is abnormally low  Review of Glycemic Control  Diabetes history: DM2 Outpatient Diabetes medications: 70/30 30 units bid Current orders for Inpatient glycemic control: 70/30 15 units bid, Novolog 0-5 units hs correction  Inpatient Diabetes Program Recommendations:   Noted patient has not received any insulin but had hypoglycemia yesterday afternoon. Solumedrol 20 x 1 today Please consider: -Decrease 70/30 insulin to 8 units bid  -Novolog correction 0-6 units tid, 0-5 units hs   Thank you, Bethena Roys E. Mozell Haber, RN, MSN, CDE  Diabetes Coordinator Inpatient Glycemic Control Team Team Pager [phone] (8am-5pm) 04/02/2022 11:20 AM

## 2022-04-02 NOTE — Progress Notes (Signed)
PROGRESS NOTE                                                                                                                                                                                                             Patient Demographics:    Edward [name], is a 51 y.o. male, DOB - 03-13-1971, BZJ:[id number]  Outpatient Primary MD for the patient is [name], MD    LOS - 1  Admit date - 04/01/2022    No chief complaint on file.      Brief Narrative (HPI from H&P)   52 y.o. male, history of severe PAD s/p left transmetatarsal amputation, right BKA with necrotic stump, multiple amputation in left hand fingers, ESRD on TTS schedule for dialysis, DM type 2 insulin-dependent, dyslipidemia, CAD with MI in past, combined systolic and diastolic CHF EF 01%, orthostatic hypotension of chronic disease, AAA, recent Zio patch placement, who was brought in for an elective R digit amputation, by hand surgeon Dr. Fredna Dow, during surgery he was found to have a lot of pus in his right hand, I was requested to admit him for IV antibiotics followed by a repeat surgery later this week if required.   Subjective:    Edward [name] today has, No headache, No chest pain, No abdominal pain - No Nausea, No new weakness tingling or numbness, shortness of breath, some itching which is generalized.   Assessment  & Plan :    1.  Hand abscess found during elective procedure which was right index finger amputation through MP joint, right index finger and hand incision and drainage done on 04/01/2022 by hand surgeon Dr. Fredna Dow.  He currently does not have any systemic symptoms, he is afebrile, no chills, will be admitted to the hospital for IV antibiotics, have discussed with ID, will order random blood cultures, IV vancomycin and cefepime to be dosed by pharmacy.  MRSA nasal PCR.  ID and hand surgery will continue to monitor.  Follow-up with supportive care.  Likely  repeat surgery in the next few days if needed.   2.  PAD with multiple previous temptations including multiple upper extremity digits in both hands, right BKA, left transmetatarsal amputation.  Currently stable, continue dual antiplatelet therapy which is okay to continue per hand surgeon, continue statin.   3.  ESRD.  On TTS schedule.  Nephrology has been consulted.   4.  CAD with ischemic cardiomyopathy, chronic combined systolic and diastolic heart failure last known EF around 45%.  Stable and compensated.  Continue dual antiplatelet therapy, Zetia and statin for secondary prevention, usually has issues with low blood pressure currently blood pressure is stable will challenge with low-dose Coreg and monitor.  No acute issues he is clinically stable.   5.  Dyslipidemia.  Continue combination of statin and Zetia.   6.  Generalized skin itching.  Likely due to Vicodin, discontinued, he is allergic to Benadryl, getting Pepcid, cetirizine and IV steroid 1 dose.    7. DM type II.  Insulin-dependent.  Continue less than home dose twice daily 70/30+ sliding scale.  Extra 7030 t morning of 04/02/2022 one-time as he is getting IV steroid.  Lab Results  Component Value Date   HGBA1C 6.5 (H) 01/07/2022   CBG (last 3)  Recent Labs    04/01/22 1632 04/01/22 2230 04/02/22 0829  GLUCAP 71 190* 111*           Condition -  Guarded  Family Communication  :  None present  Code Status :  Full  Consults  : Nephrology, ID, hand surgery Dr. Fredna Dow  PUD Prophylaxis : PPI   Procedures  :      04/01/22 -    1.  Right index finger amputation through MP joint 2.  Right index finger and hand incision and drainage 3.  Irrigation and packing of small finger wound   SURGEON:  Leanora Cover, M.D.          Disposition Plan  :    Status is: Inpatient  DVT Prophylaxis  :    heparin injection 5,000 Units Start: 04/01/22 2200 SCD's Start: 04/01/22 1941  Lab Results  Component Value Date    PLT 193 04/02/2022    Diet :  Diet Order             Diet renal with fluid restriction Fluid restriction: 1500 mL Fluid; Room service appropriate? Yes; Fluid consistency: Thin  Diet effective now                    Inpatient Medications  Scheduled Meds:  allopurinol  100 mg Oral Daily   vitamin C  1,000 mg Oral Daily   aspirin  81 mg Oral Daily   atorvastatin  80 mg Oral q AM   carvedilol  3.125 mg Oral BID WC   clopidogrel  75 mg Oral Daily   ezetimibe  10 mg Oral Daily   gabapentin  100 mg Oral TID   heparin injection (subcutaneous)  5,000 Units Subcutaneous Q8H   insulin aspart  0-5 Units Subcutaneous QHS   insulin aspart protamine- aspart  15 Units Subcutaneous BID WC   linaclotide  72 mcg Oral q AM   loratadine  10 mg Oral Daily   methylPREDNISolone (SOLU-MEDROL) injection  20 mg Intravenous Once   pantoprazole  40 mg Oral Daily   traMADol  50 mg Oral Q6H   vancomycin variable dose per unstable renal function (pharmacist dosing)   Does not apply See admin instructions   Continuous Infusions:  anticoagulant sodium citrate     ceFEPime (MAXIPIME) IV 1 g (04/01/22 2239)   lactated ringers 50 mL/hr at 04/01/22 2034   PRN Meds:.acetaminophen **OR** acetaminophen, acetaminophen, alteplase, anticoagulant sodium citrate, heparin, heparin, HYDROmorphone (DILAUDID) injection, lidocaine (PF), lidocaine-prilocaine, magnesium hydroxide, morphine injection, ondansetron **OR** ondansetron (ZOFRAN) IV, pentafluoroprop-tetrafluoroeth  Time  Spent in minutes  30   Lala Lund M.D on 04/02/2022 at 8:51 AM  To page go to [email or web address]   [facility] -  Office  [phone]  See all Orders from today for further details    Objective:   Vitals:   04/01/22 1920 04/01/22 2222 04/02/22 0528 04/02/22 0827  BP: 137/82 123/77 138/83 115/75  Pulse: 87 84 84 79  Resp: '20 17 18 17  '$ Temp: 97.7 F (36.5 C) 97.6 F (36.4 C) 98.2 F (36.8 C) 98.4 F (36.9 C)  TempSrc:    Oral Oral  SpO2: 100% 96% 100% 100%  Weight:      Height:        Wt Readings from Last 3 Encounters:  04/01/22 97.5 kg  02/13/22 97.5 kg  01/12/22 89.2 kg     Intake/Output Summary (Last 24 hours) at 04/02/2022 0851 Last data filed at 04/02/2022 9211 Gross per 24 hour  Intake 1778.83 ml  Output 500 ml  Net 1278.83 ml     Physical Exam  Awake Alert, No new F.N deficits, Normal affect [place].AT,PERRAL Supple Neck, No JVD,   Symmetrical Chest wall movement, Good air movement bilaterally, CTAB RRR,No Gallops,Rubs or new Murmurs,  +ve B.Sounds, Abd Soft, No tenderness,   No Cyanosis, Clubbing , right BKA, left transmetatarsal amputation, right hand under bandage with 2 digits, left hand has 3 digits, left arm AV fistula   Data Review:    CBC Recent Labs  Lab 04/01/22 2126 04/02/22 0205  WBC 6.4 5.4  HGB 9.9* 9.0*  HCT 29.4* 27.1*  PLT 191 193  MCV 91.9 93.1  MCH 30.9 30.9  MCHC 33.7 33.2  RDW 16.0* 16.1*  LYMPHSABS  --  0.7  MONOABS  --  0.4  EOSABS  --  0.2  BASOSABS  --  0.0    Electrolytes Recent Labs  Lab 04/01/22 2126 04/02/22 0205  NA  --  134*  K  --  4.5  CL  --  92*  CO2  --  20*  GLUCOSE  --  106*  BUN  --  77*  CREATININE 12.04* 11.87*  CALCIUM  --  8.8*  AST  --  13*  ALT  --  10  ALKPHOS  --  82  BILITOT  --  0.8  ALBUMIN  --  2.5*  MG  --  2.2    ------------------------------------------------------------------------------------------------------------------ No results for input(s): "CHOL", "HDL", "LDLCALC", "TRIG", "CHOLHDL", "LDLDIRECT" in the last 72 hours.  Lab Results  Component Value Date   HGBA1C 6.5 (H) 01/07/2022   Micro Results Recent Results (from the past 240 hour(s))  Aerobic/Anaerobic Culture w Gram Stain (surgical/deep wound)     Status: None (Preliminary result)   Collection Time: 04/01/22  3:33 PM   Specimen: PATH Other; Tissue  Result Value Ref Range Status   Specimen Description FINGER  Final   Special  Requests RIGHT INDEX FINGER  Final   Gram Stain   Final    RARE WBC PRESENT,BOTH PMN AND MONONUCLEAR RARE GRAM POSITIVE COCCI Performed at [facility], [place] [address], [place], [place] [address]    Culture PENDING  Incomplete   Report Status PENDING  Incomplete  Culture, blood (Routine X 2) w Reflex to ID Panel     Status: None (Preliminary result)   Collection Time: 04/01/22  9:26 PM   Specimen: BLOOD  Result Value Ref Range Status   Specimen Description BLOOD SITE NOT SPECIFIED  Final  Special Requests   Final    BOTTLES DRAWN AEROBIC AND ANAEROBIC Blood Culture results may not be optimal due to an inadequate volume of blood received in culture bottles   Culture   Final    NO GROWTH < 12 HOURS Performed at [facility] [address] [place], [place] [address]    Report Status PENDING  Incomplete    Radiology Reports No results found.

## 2022-04-02 NOTE — Progress Notes (Signed)
Pharmacy Antibiotic Note  Edward [name] is a 51 y.o. male admitted on 04/01/2022 right hand infection, prior amputation of R index finger through proximal phalanx. Continued pain and ischemia. Now s/p amputation of R index finger through MP point, I&D of R index finger and hand/palm, and irrigation and packing of R small finger wound infection. Pharmacy has been consulted for Vancomycin and Cefepime dosing.  Pt undergoing HD today - will dose vancomycin.  Plan: Cefepime 1gm IV q24h - after HD on HD days Vancomycin '1000mg'$  IV qHD  Height: '6\' 1"'$  (185.4 cm) Weight: 102.3 kg (225 lb 8.5 oz) IBW/kg (Calculated) : 79.9  Temp (24hrs), Avg:97.9 F (36.6 C), Min:97.4 F (36.3 C), Max:98.4 F (36.9 C)   Allergies  Allergen Reactions   Crestor [Rosuvastatin] Rash    Also had a red eye   Dilaudid [Hydromorphone] Itching   Benadryl [Diphenhydramine] Rash    Antimicrobials this admission:  Cefazolin x 1 pre-op on 8/14  Vancomycin 8/14>>  Cefepime 8/14 >>  Microbiology results:  8/14 surgical/deep wound (finger): sent  8/14 blood:  8/14 MRSA PCR:    Thank you for allowing pharmacy to be a part of this patient's care.   Arrie Senate, PharmD, BCPS, [facility] Clinical Pharmacist [phone] Please check AMION for all [place] numbers 04/02/2022

## 2022-04-03 DIAGNOSIS — L02519 Cutaneous abscess of unspecified hand: Secondary | ICD-10-CM | POA: Diagnosis not present

## 2022-04-03 LAB — CBC WITH DIFFERENTIAL/PLATELET
Abs Immature Granulocytes: 0.03 10*3/uL (ref 0.00–0.07)
Basophils Absolute: 0 10*3/uL (ref 0.0–0.1)
Basophils Relative: 0 %
Eosinophils Absolute: 0.1 10*3/uL (ref 0.0–0.5)
Eosinophils Relative: 1 %
HCT: 29.7 % — ABNORMAL LOW (ref 39.0–52.0)
Hemoglobin: 9.8 g/dL — ABNORMAL LOW (ref 13.0–17.0)
Immature Granulocytes: 1 %
Lymphocytes Relative: 13 %
Lymphs Abs: 0.8 10*3/uL (ref 0.7–4.0)
MCH: 30.9 pg (ref 26.0–34.0)
MCHC: 33 g/dL (ref 30.0–36.0)
MCV: 93.7 fL (ref 80.0–100.0)
Monocytes Absolute: 0.5 10*3/uL (ref 0.1–1.0)
Monocytes Relative: 9 %
Neutro Abs: 4.6 10*3/uL (ref 1.7–7.7)
Neutrophils Relative %: 76 %
Platelets: 218 10*3/uL (ref 150–400)
RBC: 3.17 MIL/uL — ABNORMAL LOW (ref 4.22–5.81)
RDW: 16.1 % — ABNORMAL HIGH (ref 11.5–15.5)
WBC: 6 10*3/uL (ref 4.0–10.5)
nRBC: 0 % (ref 0.0–0.2)

## 2022-04-03 LAB — COMPREHENSIVE METABOLIC PANEL
ALT: 8 U/L (ref 0–44)
AST: 15 U/L (ref 15–41)
Albumin: 2.9 g/dL — ABNORMAL LOW (ref 3.5–5.0)
Alkaline Phosphatase: 85 U/L (ref 38–126)
Anion gap: 18 — ABNORMAL HIGH (ref 5–15)
BUN: 45 mg/dL — ABNORMAL HIGH (ref 6–20)
CO2: 24 mmol/L (ref 22–32)
Calcium: 9.3 mg/dL (ref 8.9–10.3)
Chloride: 93 mmol/L — ABNORMAL LOW (ref 98–111)
Creatinine, Ser: 7.93 mg/dL — ABNORMAL HIGH (ref 0.61–1.24)
GFR, Estimated: 8 mL/min — ABNORMAL LOW (ref >60)
Glucose, Bld: 80 mg/dL (ref 70–99)
Potassium: 4.3 mmol/L (ref 3.5–5.1)
Sodium: 135 mmol/L (ref 135–145)
Total Bilirubin: 0.6 mg/dL (ref 0.3–1.2)
Total Protein: 7.3 g/dL (ref 6.5–8.1)

## 2022-04-03 LAB — GLUCOSE, CAPILLARY
Glucose-Capillary: 46 mg/dL — ABNORMAL LOW (ref 70–99)
Glucose-Capillary: 58 mg/dL — ABNORMAL LOW (ref 70–99)
Glucose-Capillary: 114 mg/dL — ABNORMAL HIGH (ref 70–99)
Glucose-Capillary: 76 mg/dL (ref 70–99)
Glucose-Capillary: 145 mg/dL — ABNORMAL HIGH (ref 70–99)
Glucose-Capillary: 134 mg/dL — ABNORMAL HIGH (ref 70–99)

## 2022-04-03 LAB — MAGNESIUM: Magnesium: 2 mg/dL (ref 1.7–2.4)

## 2022-04-03 MED ORDER — INSULIN ASPART 100 UNIT/ML IJ SOLN
0.0000 [IU] | Freq: Three times a day (TID) | INTRAMUSCULAR | Status: DC
  Administered 2022-04-03: 1 [IU] via SUBCUTANEOUS

## 2022-04-03 MED ORDER — INSULIN ASPART PROT & ASPART (70-30 MIX) 100 UNIT/ML ~~LOC~~ SUSP
8.0000 [IU] | Freq: Two times a day (BID) | SUBCUTANEOUS | Status: DC
  Administered 2022-04-03 – 2022-04-04 (×3): 8 [IU] via SUBCUTANEOUS

## 2022-04-03 MED ORDER — VANCOMYCIN HCL IN DEXTROSE 1-5 GM/200ML-% IV SOLN
1000.0000 mg | INTRAVENOUS | Status: DC
  Administered 2022-04-04: 1000 mg via INTRAVENOUS
  Filled 2022-04-03 (×2): qty 200

## 2022-04-03 NOTE — Progress Notes (Signed)
Pt receives out-pt HD at [facility] on TTS. Pt has a 5:45 am chair time. Will assist as needed.   Melven Sartorius Renal Navigator [phone]

## 2022-04-03 NOTE — TOC Progression Note (Addendum)
Transition of Care [facility]) - Progression Note    Patient Details  Name: Edward [name] MRN: [id number] Date of Birth: 09-22-1970  Transition of Care [facility]) CM/SW Contact  Carles Collet, RN Phone Number: 04/03/2022, 11:48 AM  Clinical Narrative:     Patient may require IV Abx after DC,  these will managed by outpatient HD.  Tentative DC date 2-3 days.     Expected Discharge Plan: [place] Barriers to Discharge: Continued Medical Work up  Expected Discharge Plan and Services Expected Discharge Plan: [place]   Discharge Planning Services: CM Consult Post Acute Care Choice: [place] arrangements for the past 2 months: Single Family Home                 DME Arranged: N/A DME Agency: NA       HH Arranged: PT, OT           Social Determinants of Health (SDOH) Interventions    Readmission Risk Interventions    04/02/2022   10:33 AM 01/14/2022   12:26 PM  Readmission Risk Prevention Plan  Transportation Screening Complete Complete  PCP or Specialist Appt within 3-5 Days  Complete  HRI or [place]  Complete  Social Work Consult for [place] Planning/Counseling  Complete  Palliative Care Screening  Not Applicable  Medication Review Press photographer) Complete Referral to Pharmacy  PCP or Specialist appointment within 3-5 days of discharge Complete   HRI or [place] Complete   SW Recovery Care/Counseling Consult Complete   [place] Not Applicable

## 2022-04-03 NOTE — Progress Notes (Signed)
Physical Therapy Treatment Patient Details Name: Edward [name] MRN: [id number] DOB: May 27, 1971 Today's Date: 04/03/2022   History of Present Illness Pt is a 51 y/o M presenting with right hand infection, prior amputation of R index finger through proximal phalanx. Now s/p amputation of R index finger through MP point, I&D of R index finger and hand/palm, and irrigation and packing of R small finger wound infection. PMH includes severe PAD s/p left transmetatarsal amputation, right BKA with necrotic stump, multiple amputation in left hand fingers, ESRD on TTS schedule for dialysis, DM type 2 insulin-dependent, dyslipidemia, CAD with MI in past, combined systolic and diastolic CHF EF 53%, orthostatic hypotension of chronic disease, AAA, recent Zio patch placement.    PT Comments    Continuing work on functional mobility and activity tolerance;  Session focused on functional transfers -- practiced sit to stand and stand pivot tranfers with a R platform RW to allow for RUE support for balance during transfer without putting weight on healing R hand; PT needed mod assist of 2 for sit to stand and for pivoting to chair; He gave [name] a solid effort to try the platform RW, but ultimately Edward [name]; He reports he has a good technique with his sons to get in the bathroom; Recommend focusing on lateral scooting next session, and discussing car transfers  Recommendations for follow up therapy are one component of a multi-disciplinary discharge planning process, led by the attending physician.  Recommendations may be updated based on patient status, additional functional criteria and insurance authorization.  Follow Up Recommendations  Home health PT     Assistance Recommended at Discharge Frequent or constant Supervision/Assistance  Patient can return home with the following A little help with walking and/or transfers;A little help with bathing/dressing/bathroom;Assistance with  cooking/housework;Assist for transportation;Help with stairs or ramp for entrance   Equipment Recommendations  Other (comment) (pt already has all equipment needed) and he did not like the platform RW with today's initial try   Recommendations for Other Services       Precautions / Restrictions Precautions Precautions: Fall Precaution Comments: R AKA, L transmetetarsal amputation, LUE digit amputations. No formal WB restrictions for R UE but advised pt to avoid WB through R hand/wrist as conservative measure     Mobility  Bed Mobility Overal bed mobility: Modified Independent                  Transfers Overall transfer level: Needs assistance Equipment used: Right platform walker Transfers: Sit to/from Stand, Bed to chair/wheelchair/BSC Sit to Stand: +2 physical assistance, Mod assist Stand pivot transfers: +2 physical assistance, Mod assist         General transfer comment: Stood form EOB to R platform RW; Mod assist to rise on L foot, and assist to stabilize foot as slipping was noted; very slow pivot on L foot/heel to recliner on pt's R; Mod assist to control descent to sit    Ambulation/Gait                   Stairs             Wheelchair Mobility    Modified Rankin (Stroke Patients Only)       Balance Overall balance assessment: Needs assistance   Sitting balance-Leahy Scale: Good       Standing balance-Leahy Scale: Zero (approaching Poor)  Cognition Arousal/Alertness: Awake/alert Behavior During Therapy: WFL for tasks assessed/performed Overall Cognitive Status: Within Functional Limits for tasks assessed                                          Exercises      General Comments        Pertinent Vitals/Pain Pain Assessment Pain Assessment: Faces Faces Pain Scale: Hurts little more Pain Location: R hand Pain Descriptors / Indicators: Discomfort, Grimacing Pain  Intervention(s): Monitored during session    Home Living                          Prior Function            PT Goals (current goals can now be found in the care plan section) Acute Rehab PT Goals Patient Stated Goal: to get his AKA prosthesis PT Goal Formulation: With patient Time For Goal Achievement: 04/09/22 Potential to Achieve Goals: Good Progress towards PT goals: Progressing toward goals    Frequency    Min 3X/week      PT Plan Current plan remains appropriate    Co-evaluation              AM-PAC PT "6 Clicks" Mobility   Outcome Measure  Help needed turning from your back to your side while in a flat bed without using bedrails?: None Help needed moving from lying on your back to sitting on the side of a flat bed without using bedrails?: A Little Help needed moving to and from a bed to a chair (including a wheelchair)?: A Lot Help needed standing up from a chair using your arms (e.g., wheelchair or bedside chair)?: A Lot Help needed to walk in hospital room?: Total Help needed climbing 3-5 steps with a railing? : Total 6 Click Score: 13    End of Session Equipment Utilized During Treatment: Gait belt Activity Tolerance: Patient tolerated treatment well Patient left: in chair;with call bell/phone within reach;with chair alarm set Nurse Communication: Mobility status PT Visit Diagnosis: Unsteadiness on feet (R26.81);Other abnormalities of gait and mobility (R26.89);Muscle weakness (generalized) (M62.81);Pain Pain - Right/Left: Right Pain - part of body: Hand     Time: 5916-3846 PT Time Calculation (min) (ACUTE ONLY): 18 min  Charges:  $Therapeutic Activity: 8-22 mins                     Roney Marion, PT  Acute Rehabilitation Services Office [phone]    Edward [name] 04/03/2022, 1:48 PM

## 2022-04-03 NOTE — Progress Notes (Signed)
[facility] Progress Note    Assessment/ Plan:   ESRD:  -Outpatient HD orders:[facility].  4.5 hours.  F180.  Flow rates: 425/700.  2K/2 Cal.  LUE AVF.  Meds: Mircera 75 mcg every 2 weeks (last dose 8/1).  Heparin: 8000 units q. treatment, 2000 units mid run -HD on TTS schedule   Hand abscess -I&D 8/14, antibiotics per primary service. ID and hand surgery following -vanc and cefepime for 2-3 weeks   Volume/ hypertension: BP currently acceptable, will UF as tolerated   Anemia of Chronic Kidney Disease: Hemoglobin 9, likely related to surgery.  Started Aranesp 8/15. Avoid IV Fe for now   Secondary Hyperparathyroidism/Hyperphosphatemia: on velphoro at home.  Will start [place] here. Check PO4   Generalized skin itching -Occurred after receiving Vicodin.  Per primary. Resolved/improved   DM2, insulin-dependent -Per primary  Subjective:   No acute events. Tolerated HD yesterday with net UF 2L. No complaints.   Objective:   BP 121/73 (BP Location: Right Arm)   Pulse 73   Temp 97.9 F (36.6 C) (Oral)   Resp 16   Ht '6\' 1"'$  (1.854 m)   Wt 98.8 kg   SpO2 99%   BMI 28.74 kg/m   Intake/Output Summary (Last 24 hours) at 04/03/2022 1113 Last data filed at 04/03/2022 4332 Gross per 24 hour  Intake 1000 ml  Output 3000 ml  Net -2000 ml   Weight change: 4.777 kg  Physical Exam: Gen:NAD CVS:RRR Resp:CTA BL RJJ:OACZ, nd/nt Ext:no sig edema, right hand dressing in place, rt bka, left hand digit amputations, left TMA Neuro: awake alert Dialysis access: LUE AVF +b/t  Imaging: No results found.  Labs: BMET Recent Labs  Lab 04/01/22 1254 04/01/22 2126 04/02/22 0205 04/03/22 0146  NA 134*  --  134* 135  K 4.4  --  4.5 4.3  CL 97*  --  92* 93*  CO2  --   --  20* 24  GLUCOSE 61*  --  106* 80  BUN 77*  --  77* 45*  CREATININE 12.30* 12.04* 11.87* 7.93*  CALCIUM  --   --  8.8* 9.3   CBC Recent Labs  Lab 04/01/22 1254 04/01/22 2126 04/02/22 0205  04/03/22 0146  WBC  --  6.4 5.4 6.0  NEUTROABS  --   --  4.0 4.6  HGB 11.6* 9.9* 9.0* 9.8*  HCT 34.0* 29.4* 27.1* 29.7*  MCV  --  91.9 93.1 93.7  PLT  --  191 193 218    Medications:     allopurinol  100 mg Oral Daily   vitamin C  1,000 mg Oral Daily   aspirin  81 mg Oral Daily   atorvastatin  80 mg Oral q AM   carvedilol  3.125 mg Oral BID WC   clopidogrel  75 mg Oral Daily   darbepoetin (ARANESP) injection - DIALYSIS  60 mcg Intravenous Q Tue-HD   ezetimibe  10 mg Oral Daily   ferric citrate  630 mg Oral TID WC   gabapentin  100 mg Oral TID   heparin injection (subcutaneous)  5,000 Units Subcutaneous Q8H   insulin aspart  0-9 Units Subcutaneous TID WC   insulin aspart protamine- aspart  8 Units Subcutaneous BID WC   linaclotide  72 mcg Oral q AM   loratadine  10 mg Oral Daily   pantoprazole  40 mg Oral Daily   traMADol  50 mg Oral Q6H      Gean Quint, MD [facility] 04/03/2022, 11:13 AM

## 2022-04-03 NOTE — Progress Notes (Signed)
PROGRESS NOTE                                                                                                                                                                                                             Patient Demographics:    Edward [name], is a 51 y.o. male, DOB - 1970-12-07, JQB:[id number]  Outpatient Primary MD for the patient is [name], MD    LOS - 2  Admit date - 04/01/2022    No chief complaint on file.      Brief Narrative (HPI from H&P)   51 y.o. male, history of severe PAD s/p left transmetatarsal amputation, right BKA with necrotic stump, multiple amputation in left hand fingers, ESRD on TTS schedule for dialysis, DM type 2 insulin-dependent, dyslipidemia, CAD with MI in past, combined systolic and diastolic CHF EF 40%, orthostatic hypotension of chronic disease, AAA, recent Zio patch placement, who was brought in for an elective R digit amputation, by hand surgeon Dr. Fredna Dow, during surgery he was found to have a lot of pus in his right hand, I was requested to admit him for IV antibiotics followed by a repeat surgery later this week if required.   Subjective:   Patient in bed, appears comfortable, denies any headache, no fever, no chest pain or pressure, no shortness of breath , no abdominal pain. No focal weakness.   Assessment  & Plan :    1.  Hand abscess found during elective procedure which was right index finger amputation through MP joint, right index finger and hand incision and drainage done on 04/01/2022 by hand surgeon Dr. Fredna Dow.  He currently does not have any systemic symptoms, he is afebrile, no chills, will be admitted to the hospital for IV antibiotics, have discussed with ID, will order random blood cultures, IV vancomycin and cefepime to be dosed by pharmacy.  MRSA nasal PCR.  ID following Case discussed with Dr. Fredna Dow he is inclining more towards IV antibiotics for 2 to 3 weeks  thereafter reevaluate.   2.  PAD with multiple previous temptations including multiple upper extremity digits in both hands, right BKA, left transmetatarsal amputation.  Currently stable, continue dual antiplatelet therapy which is okay to continue per hand surgeon, continue statin.   3.  ESRD.  On TTS schedule.  Nephrology has been consulted.   4.  CAD with ischemic cardiomyopathy, chronic combined systolic and diastolic heart failure last known EF around 45%.  Stable and compensated.  Continue dual antiplatelet therapy, Zetia and statin for secondary prevention, usually has issues with low blood pressure currently blood pressure is stable will challenge with low-dose Coreg and monitor.  No acute issues he is clinically stable.   5.  Dyslipidemia.  Continue combination of statin and Zetia.   6.  Generalized skin itching.  Likely due to Vicodin, discontinued, resolved.   7. DM type II.  Insulin-dependent.  Continue less than home dose twice daily 70/30+ sensitive sliding scale. Dose  reduced further on 04/03/2022, question his dietary compliance at home as he is almost at one third his home dose.   Lab Results  Component Value Date   HGBA1C 6.5 (H) 01/07/2022   CBG (last 3)  Recent Labs    04/02/22 1404 04/02/22 2038 04/03/22 0818  GLUCAP 154* 127* 46*         Condition -  Guarded  Family Communication  :  None present  Code Status :  Full  Consults  : Nephrology, ID, hand surgery Dr. Fredna Dow  PUD Prophylaxis : PPI   Procedures  :      04/01/22 -    1.  Right index finger amputation through MP joint 2.  Right index finger and hand incision and drainage 3.  Irrigation and packing of small finger wound   SURGEON:  Leanora Cover, M.D.       Disposition Plan  :    Status is: Inpatient  DVT Prophylaxis  :    heparin injection 5,000 Units Start: 04/01/22 2200 SCD's Start: 04/01/22 1941  Lab Results  Component Value Date   PLT 218 04/03/2022    Diet :  Diet  Order             Diet renal with fluid restriction Fluid restriction: 1500 mL Fluid; Room service appropriate? Yes; Fluid consistency: Thin  Diet effective now                    Inpatient Medications  Scheduled Meds:  allopurinol  100 mg Oral Daily   vitamin C  1,000 mg Oral Daily   aspirin  81 mg Oral Daily   atorvastatin  80 mg Oral q AM   carvedilol  3.125 mg Oral BID WC   clopidogrel  75 mg Oral Daily   darbepoetin (ARANESP) injection - DIALYSIS  60 mcg Intravenous Q Tue-HD   ezetimibe  10 mg Oral Daily   ferric citrate  630 mg Oral TID WC   gabapentin  100 mg Oral TID   heparin injection (subcutaneous)  5,000 Units Subcutaneous Q8H   insulin aspart  0-9 Units Subcutaneous TID WC   insulin aspart protamine- aspart  8 Units Subcutaneous BID WC   linaclotide  72 mcg Oral q AM   loratadine  10 mg Oral Daily   pantoprazole  40 mg Oral Daily   traMADol  50 mg Oral Q6H   vancomycin variable dose per unstable renal function (pharmacist dosing)   Does not apply See admin instructions   Continuous Infusions:  anticoagulant sodium citrate     ceFEPime (MAXIPIME) IV 1 g (04/02/22 2110)   PRN Meds:.acetaminophen, alteplase, anticoagulant sodium citrate, heparin, heparin, lidocaine (PF), lidocaine-prilocaine, magnesium hydroxide, morphine injection, ondansetron **OR** ondansetron (ZOFRAN) IV, pentafluoroprop-tetrafluoroeth  Time Spent in minutes  30   Lala Lund M.D on 04/03/2022 at 8:32 AM  To  page go to [email or web address]   [facility] -  Office  [phone]  See all Orders from today for further details    Objective:   Vitals:   04/02/22 1949 04/02/22 2046 04/03/22 0454 04/03/22 0756  BP: (!) 151/84 (!) 144/83 115/75 121/73  Pulse: 84 88 73 73  Resp: '15 18 16 16  '$ Temp:  98.1 F (36.7 C) (!) 97.5 F (36.4 C) 97.9 F (36.6 C)  TempSrc:  Oral Oral Oral  SpO2: 97% 100% 100% 99%  Weight:      Height:        Wt Readings from Last 3 Encounters:   04/02/22 98.8 kg  02/13/22 97.5 kg  01/12/22 89.2 kg     Intake/Output Summary (Last 24 hours) at 04/03/2022 0076 Last data filed at 04/03/2022 0100 Gross per 24 hour  Intake 540 ml  Output 3000 ml  Net -2460 ml     Physical Exam  Awake Alert, No new F.N deficits, Normal affect [place].AT,PERRAL Supple Neck, No JVD,   Symmetrical Chest wall movement, Good air movement bilaterally, CTAB RRR,No Gallops, Rubs or new Murmurs,  +ve B.Sounds, Abd Soft, No tenderness,   No Cyanosis, Clubbing , right BKA, left transmetatarsal amputation, right hand under bandage with 2 digits, left hand has 3 digits, left arm AV fistula   Data Review:    CBC Recent Labs  Lab 04/01/22 1254 04/01/22 2126 04/02/22 0205 04/03/22 0146  WBC  --  6.4 5.4 6.0  HGB 11.6* 9.9* 9.0* 9.8*  HCT 34.0* 29.4* 27.1* 29.7*  PLT  --  191 193 218  MCV  --  91.9 93.1 93.7  MCH  --  30.9 30.9 30.9  MCHC  --  33.7 33.2 33.0  RDW  --  16.0* 16.1* 16.1*  LYMPHSABS  --   --  0.7 0.8  MONOABS  --   --  0.4 0.5  EOSABS  --   --  0.2 0.1  BASOSABS  --   --  0.0 0.0    Electrolytes Recent Labs  Lab 04/01/22 1254 04/01/22 2126 04/02/22 0205 04/03/22 0146  NA 134*  --  134* 135  K 4.4  --  4.5 4.3  CL 97*  --  92* 93*  CO2  --   --  20* 24  GLUCOSE 61*  --  106* 80  BUN 77*  --  77* 45*  CREATININE 12.30* 12.04* 11.87* 7.93*  CALCIUM  --   --  8.8* 9.3  AST  --   --  13* 15  ALT  --   --  10 8  ALKPHOS  --   --  82 85  BILITOT  --   --  0.8 0.6  ALBUMIN  --   --  2.5* 2.9*  MG  --   --  2.2 2.0    ------------------------------------------------------------------------------------------------------------------ No results for input(s): "CHOL", "HDL", "LDLCALC", "TRIG", "CHOLHDL", "LDLDIRECT" in the last 72 hours.  Lab Results  Component Value Date   HGBA1C 6.5 (H) 01/07/2022   Micro Results Recent Results (from the past 240 hour(s))  Aerobic/Anaerobic Culture w Gram Stain (surgical/deep wound)      Status: None (Preliminary result)   Collection Time: 04/01/22  3:33 PM   Specimen: PATH Other; Tissue  Result Value Ref Range Status   Specimen Description FINGER  Final   Special Requests RIGHT INDEX FINGER  Final   Gram Stain   Final    RARE WBC PRESENT,BOTH PMN AND MONONUCLEAR  RARE GRAM POSITIVE COCCI    Culture   Final    RARE GRAM NEGATIVE RODS CULTURE REINCUBATED FOR BETTER GROWTH Performed at [facility], [place] [address], [place], [place] [address]    Report Status PENDING  Incomplete  Culture, blood (Routine X 2) w Reflex to ID Panel     Status: None (Preliminary result)   Collection Time: 04/01/22  9:26 PM   Specimen: BLOOD  Result Value Ref Range Status   Specimen Description BLOOD SITE NOT SPECIFIED  Final   Special Requests   Final    BOTTLES DRAWN AEROBIC AND ANAEROBIC Blood Culture results may not be optimal due to an inadequate volume of blood received in culture bottles   Culture   Final    NO GROWTH 2 DAYS Performed at [facility], [place] [address] [place], [place] [address]    Report Status PENDING  Incomplete    Radiology Reports No results found.

## 2022-04-03 NOTE — Plan of Care (Signed)
  Problem: Coping: Goal: Ability to adjust to condition or change in health will improve Outcome: Progressing   

## 2022-04-04 ENCOUNTER — Inpatient Hospital Stay (HOSPITAL_COMMUNITY): Payer: Medicare Other

## 2022-04-04 DIAGNOSIS — B957 Other staphylococcus as the cause of diseases classified elsewhere: Secondary | ICD-10-CM

## 2022-04-04 DIAGNOSIS — L02511 Cutaneous abscess of right hand: Secondary | ICD-10-CM | POA: Diagnosis not present

## 2022-04-04 DIAGNOSIS — L02519 Cutaneous abscess of unspecified hand: Secondary | ICD-10-CM | POA: Diagnosis not present

## 2022-04-04 LAB — COMPREHENSIVE METABOLIC PANEL
ALT: 6 U/L (ref 0–44)
AST: 13 U/L — ABNORMAL LOW (ref 15–41)
Albumin: 3 g/dL — ABNORMAL LOW (ref 3.5–5.0)
Alkaline Phosphatase: 82 U/L (ref 38–126)
Anion gap: 20 — ABNORMAL HIGH (ref 5–15)
BUN: 63 mg/dL — ABNORMAL HIGH (ref 6–20)
CO2: 23 mmol/L (ref 22–32)
Calcium: 9.2 mg/dL (ref 8.9–10.3)
Chloride: 92 mmol/L — ABNORMAL LOW (ref 98–111)
Creatinine, Ser: 9.95 mg/dL — ABNORMAL HIGH (ref 0.61–1.24)
GFR, Estimated: 6 mL/min — ABNORMAL LOW (ref >60)
Glucose, Bld: 105 mg/dL — ABNORMAL HIGH (ref 70–99)
Potassium: 4.2 mmol/L (ref 3.5–5.1)
Sodium: 135 mmol/L (ref 135–145)
Total Bilirubin: 1 mg/dL (ref 0.3–1.2)
Total Protein: 7.5 g/dL (ref 6.5–8.1)

## 2022-04-04 LAB — CBC WITH DIFFERENTIAL/PLATELET
Abs Immature Granulocytes: 0.03 10*3/uL (ref 0.00–0.07)
Basophils Absolute: 0 10*3/uL (ref 0.0–0.1)
Basophils Relative: 1 %
Eosinophils Absolute: 0.3 10*3/uL (ref 0.0–0.5)
Eosinophils Relative: 5 %
HCT: 31.8 % — ABNORMAL LOW (ref 39.0–52.0)
Hemoglobin: 10.3 g/dL — ABNORMAL LOW (ref 13.0–17.0)
Immature Granulocytes: 1 %
Lymphocytes Relative: 15 %
Lymphs Abs: 0.8 10*3/uL (ref 0.7–4.0)
MCH: 30.8 pg (ref 26.0–34.0)
MCHC: 32.4 g/dL (ref 30.0–36.0)
MCV: 95.2 fL (ref 80.0–100.0)
Monocytes Absolute: 0.5 10*3/uL (ref 0.1–1.0)
Monocytes Relative: 10 %
Neutro Abs: 3.7 10*3/uL (ref 1.7–7.7)
Neutrophils Relative %: 68 %
Platelets: 235 10*3/uL (ref 150–400)
RBC: 3.34 MIL/uL — ABNORMAL LOW (ref 4.22–5.81)
RDW: 15.9 % — ABNORMAL HIGH (ref 11.5–15.5)
WBC: 5.4 10*3/uL (ref 4.0–10.5)
nRBC: 0 % (ref 0.0–0.2)

## 2022-04-04 LAB — MAGNESIUM: Magnesium: 2.1 mg/dL (ref 1.7–2.4)

## 2022-04-04 LAB — GLUCOSE, CAPILLARY
Glucose-Capillary: 81 mg/dL (ref 70–99)
Glucose-Capillary: 70 mg/dL (ref 70–99)
Glucose-Capillary: 73 mg/dL (ref 70–99)

## 2022-04-04 NOTE — Progress Notes (Signed)
Occupational Therapy Treatment Patient Details Name: Edward [name] MRN: [id number] DOB: 07-28-1971 Today's Date: 04/04/2022   History of present illness Pt is a 51 y/o M presenting with right hand infection, prior amputation of R index finger through proximal phalanx. Now s/p amputation of R index finger through MP point, I&D of R index finger and hand/palm, and irrigation and packing of R small finger wound infection. PMH includes severe PAD s/p left transmetatarsal amputation, right BKA with necrotic stump, multiple amputation in left hand fingers, ESRD on TTS schedule for dialysis, DM type 2 insulin-dependent, dyslipidemia, CAD with MI in past, combined systolic and diastolic CHF EF 53%, orthostatic hypotension of chronic disease, AAA, recent Zio patch placement.   OT comments  Session focused on trial of scoot transfer in setting of decreased ability to use R hand post op. Overall, pt able to demo scoot transfer to/from drop arm recliner towards L side with min guard without safety concerns. Pt feels confident in completing transfers win this method with sons at home. Educated re: home setup, ADL routine modification, and body mechanics for pt/caregivers at home. DC recs remain appropriate.    Recommendations for follow up therapy are one component of a multi-disciplinary discharge planning process, led by the attending physician.  Recommendations may be updated based on patient status, additional functional criteria and insurance authorization.    Follow Up Recommendations  Home health OT    Assistance Recommended at Discharge Frequent or constant Supervision/Assistance  Patient can return home with the following  A little help with walking and/or transfers;A lot of help with bathing/dressing/bathroom;Assistance with cooking/housework   Equipment Recommendations  None recommended by OT    Recommendations for Other Services      Precautions / Restrictions Precautions Precautions:  Fall Precaution Comments: R AKA, L transmetetarsal amputation, LUE digit amputations. No formal WB restrictions for R UE but advised pt to avoid WB through R hand as conservative measure       Mobility Bed Mobility Overal bed mobility: Modified Independent                  Transfers Overall transfer level: Needs assistance Equipment used: None Transfers: Bed to chair/wheelchair/BSC            Lateral/Scoot Transfers: Min guard General transfer comment: Pt able to self direct needs and receptive to suggestions as well. Scoot transfer practice to/from drop arm recliner towards L side with OT blocking L foot (sliding despite grip socks) with pt able to complete task well though fatigued. educated on use of bedrails for addtional support as needed     Balance Overall balance assessment: Needs assistance Sitting-balance support: Feet supported, No upper extremity supported Sitting balance-Leahy Scale: Good                                     ADL either performed or assessed with clinical judgement   ADL Overall ADL's : Needs assistance/impaired                                       General ADL Comments: Emphasis on scoot transfer practive with impaired use of R hand s/p sx. Discussed bedrails, home environment setup and ADL routine modifications to maximize functioning at home [facility] at bedside)    Extremity/Trunk Assessment Upper Extremity Assessment Upper Extremity  Assessment: RUE deficits/detail;LUE deficits/detail RUE Deficits / Details: hand wrapped tightly w/ large bandage, only thumb and 5th digit exposed, difficulty using this hand functionally. s/p recent amputation noted above RUE Coordination: decreased fine motor LUE Deficits / Details: 2 digits still intact with all other digits amputated, able to hold regular utensil with this UE though coordination deficits remain LUE Coordination: decreased fine motor   Lower Extremity  Assessment Lower Extremity Assessment: Defer to PT evaluation        Vision   Vision Assessment?: No apparent visual deficits   Perception     Praxis      Cognition Arousal/Alertness: Awake/alert Behavior During Therapy: WFL for tasks assessed/performed Overall Cognitive Status: Within Functional Limits for tasks assessed                                          Exercises      Shoulder Instructions       General Comments Rn entering to provide meds and transport on the way for HD    Pertinent Vitals/ Pain       Pain Assessment Pain Assessment: Faces Faces Pain Scale: Hurts a little bit Pain Location: R hand Pain Descriptors / Indicators: Discomfort, Grimacing Pain Intervention(s): Monitored during session, Limited activity within patient's tolerance  Home Living                                          Prior Functioning/Environment              Frequency  Min 2X/week        Progress Toward Goals  OT Goals(current goals can now be found in the care plan section)  Progress towards OT goals: Progressing toward goals  Acute Rehab OT Goals Patient Stated Goal: home soon, get back to fishing OT Goal Formulation: With patient Time For Goal Achievement: 04/16/22 Potential to Achieve Goals: Good ADL Goals Pt Will Perform Lower Body Bathing: with min assist;sitting/lateral leans;bed level Pt Will Transfer to Toilet: with min assist;bedside commode Pt Will Perform Toileting - Clothing Manipulation and hygiene: sitting/lateral leans;with min assist  Plan Discharge plan remains appropriate    Co-evaluation                 AM-PAC OT "6 Clicks" Daily Activity     Outcome Measure   Help from another person eating meals?: A Little Help from another person taking care of personal grooming?: A Little Help from another person toileting, which includes using toliet, bedpan, or urinal?: A Lot Help from another person  bathing (including washing, rinsing, drying)?: A Lot Help from another person to put on and taking off regular upper body clothing?: A Little Help from another person to put on and taking off regular lower body clothing?: A Lot 6 Click Score: 15    End of Session    OT Visit Diagnosis: Other abnormalities of gait and mobility (R26.89);Pain Pain - Right/Left: Right Pain - part of body: Hand   Activity Tolerance Patient tolerated treatment well   Patient Left in bed;with call bell/phone within reach;with nursing/sitter in room   Nurse Communication Other (comment) (RN present)        Time: 8185-6314 OT Time Calculation (min): 19 min  Charges: OT General Charges $OT Visit: 1 Visit  OT Treatments $Therapeutic Activity: 8-22 mins  Malachy Chamber, OTR/L Acute Rehab Services Office: [phone]   Layla Maw 04/04/2022, 8:58 AM

## 2022-04-04 NOTE — Progress Notes (Signed)
PROGRESS NOTE                                                                                                                                                                                                             Patient Demographics:    Edward [name], is a 51 y.o. male, DOB - Sep 09, 1970, DXI:[id number]  Outpatient Primary MD for the patient is [name], MD    LOS - 3  Admit date - 04/01/2022    No chief complaint on file.      Brief Narrative (HPI from H&P)   51 y.o. male, history of severe PAD s/p left transmetatarsal amputation, right BKA with necrotic stump, multiple amputation in left hand fingers, ESRD on TTS schedule for dialysis, DM type 2 insulin-dependent, dyslipidemia, CAD with MI in past, combined systolic and diastolic CHF EF 76%, orthostatic hypotension of chronic disease, AAA, recent Zio patch placement, who was brought in for an elective R digit amputation, by hand surgeon Dr. Fredna Dow, during surgery he was found to have a lot of pus in his right hand, I was requested to admit him for IV antibiotics followed by a repeat surgery later this week if required.   Subjective:   Patient in bed, appears comfortable, denies any headache, no fever, no chest pain or pressure, no shortness of breath , no abdominal pain. No new focal weakness.    Assessment  & Plan :    1.  Hand abscess found during elective procedure which was right index finger amputation through MP joint, right index finger and hand incision and drainage done on 04/01/2022 by hand surgeon Dr. Fredna Dow.  He currently does not have any systemic symptoms, he is afebrile, no chills, will be admitted to the hospital for IV antibiotics, have discussed with ID, will order random blood cultures, IV vancomycin and cefepime to be dosed by pharmacy.  MRSA nasal PCR.  ID following Case discussed with Dr. Fredna Dow he is inclining more towards IV antibiotics for 2 to 3  weeks thereafter reevaluate him in his office.   2.  PAD with multiple previous temptations including multiple upper extremity digits in both hands, right BKA, left transmetatarsal amputation.  Currently stable, continue dual antiplatelet therapy which is okay to continue per hand surgeon, continue statin.   3.  ESRD.  On TTS schedule.  Nephrology  has been consulted.   4.  CAD with ischemic cardiomyopathy, chronic combined systolic and diastolic heart failure last known EF around 45%.  Stable and compensated.  Continue dual antiplatelet therapy, Zetia and statin for secondary prevention, usually has issues with low blood pressure currently blood pressure is stable will challenge with low-dose Coreg and monitor.  No acute issues he is clinically stable.   5.  Dyslipidemia.  Continue combination of statin and Zetia.   6.  Generalized skin itching.  Likely due to Vicodin, discontinued, resolved.   7. DM type II.  Insulin-dependent.  Continue less than home dose twice daily 70/30+ sensitive sliding scale. Dose  reduced further on 04/03/2022, question his dietary compliance at home as he is almost at one third his home dose.   Lab Results  Component Value Date   HGBA1C 6.5 (H) 01/07/2022   CBG (last 3)  Recent Labs    04/03/22 1704 04/03/22 2104 04/04/22 0808  GLUCAP 145* 134* 81         Condition -  Guarded  Family Communication  :  None present  Code Status :  Full  Consults  : Nephrology, ID, hand surgery Dr. Fredna Dow  PUD Prophylaxis : PPI   Procedures  :      04/01/22 -    1.  Right index finger amputation through MP joint 2.  Right index finger and hand incision and drainage 3.  Irrigation and packing of small finger wound   SURGEON:  Leanora Cover, M.D.       Disposition Plan  :    Status is: Inpatient  DVT Prophylaxis  :    heparin injection 5,000 Units Start: 04/01/22 2200 SCD's Start: 04/01/22 1941  Lab Results  Component Value Date   PLT 235 04/04/2022     Diet :  Diet Order             Diet renal with fluid restriction Fluid restriction: 1500 mL Fluid; Room service appropriate? Yes; Fluid consistency: Thin  Diet effective now                    Inpatient Medications  Scheduled Meds:  allopurinol  100 mg Oral Daily   vitamin C  1,000 mg Oral Daily   aspirin  81 mg Oral Daily   atorvastatin  80 mg Oral q AM   carvedilol  3.125 mg Oral BID WC   clopidogrel  75 mg Oral Daily   darbepoetin (ARANESP) injection - DIALYSIS  60 mcg Intravenous Q Tue-HD   ezetimibe  10 mg Oral Daily   ferric citrate  630 mg Oral TID WC   gabapentin  100 mg Oral TID   heparin injection (subcutaneous)  5,000 Units Subcutaneous Q8H   insulin aspart  0-9 Units Subcutaneous TID WC   insulin aspart protamine- aspart  8 Units Subcutaneous BID WC   linaclotide  72 mcg Oral q AM   loratadine  10 mg Oral Daily   pantoprazole  40 mg Oral Daily   traMADol  50 mg Oral Q6H   Continuous Infusions:  anticoagulant sodium citrate     ceFEPime (MAXIPIME) IV 1 g (04/03/22 2132)   vancomycin     PRN Meds:.acetaminophen, alteplase, anticoagulant sodium citrate, heparin, heparin, lidocaine (PF), lidocaine-prilocaine, magnesium hydroxide, morphine injection, ondansetron **OR** ondansetron (ZOFRAN) IV, pentafluoroprop-tetrafluoroeth  Time Spent in minutes  30   Lala Lund M.D on 04/04/2022 at 11:00 AM  To page go to [email or web address]   [facility] -  Office  [phone]  See all Orders from today for further details    Objective:   Vitals:   04/04/22 0807 04/04/22 1015 04/04/22 1033 04/04/22 1036  BP: 115/66 114/69 114/69 111/70  Pulse: 76 76 70 76  Resp: '17 14 14 17  '$ Temp: 97.7 F (36.5 C) 98.2 F (36.8 C) 98.2 F (36.8 C)   TempSrc: Oral Oral    SpO2: 100%     Weight:  102.8 kg 102.8 kg   Height:        Wt Readings from Last 3 Encounters:  04/04/22 102.8 kg  02/13/22 97.5 kg  01/12/22 89.2 kg    No intake or output data in the  24 hours ending 04/04/22 1100    Physical Exam  Awake Alert, No new F.N deficits, Normal affect [place].AT,PERRAL Supple Neck, No JVD,   Symmetrical Chest wall movement, Good air movement bilaterally, CTAB RRR,No Gallops, Rubs or new Murmurs,  +ve B.Sounds, Abd Soft, No tenderness, No Cyanosis, Clubbing , right BKA, left transmetatarsal amputation, right hand under bandage with 2 digits, left hand has 3 digits, left arm AV fistula   Data Review:    CBC Recent Labs  Lab 04/01/22 1254 04/01/22 2126 04/02/22 0205 04/03/22 0146 04/04/22 0106  WBC  --  6.4 5.4 6.0 5.4  HGB 11.6* 9.9* 9.0* 9.8* 10.3*  HCT 34.0* 29.4* 27.1* 29.7* 31.8*  PLT  --  191 193 218 235  MCV  --  91.9 93.1 93.7 95.2  MCH  --  30.9 30.9 30.9 30.8  MCHC  --  33.7 33.2 33.0 32.4  RDW  --  16.0* 16.1* 16.1* 15.9*  LYMPHSABS  --   --  0.7 0.8 0.8  MONOABS  --   --  0.4 0.5 0.5  EOSABS  --   --  0.2 0.1 0.3  BASOSABS  --   --  0.0 0.0 0.0    Electrolytes Recent Labs  Lab 04/01/22 1254 04/01/22 2126 04/02/22 0205 04/03/22 0146 04/04/22 0106  NA 134*  --  134* 135 135  K 4.4  --  4.5 4.3 4.2  CL 97*  --  92* 93* 92*  CO2  --   --  20* 24 23  GLUCOSE 61*  --  106* 80 105*  BUN 77*  --  77* 45* 63*  CREATININE 12.30* 12.04* 11.87* 7.93* 9.95*  CALCIUM  --   --  8.8* 9.3 9.2  AST  --   --  13* 15 13*  ALT  --   --  '10 8 6  '$ ALKPHOS  --   --  82 85 82  BILITOT  --   --  0.8 0.6 1.0  ALBUMIN  --   --  2.5* 2.9* 3.0*  MG  --   --  2.2 2.0 2.1    ------------------------------------------------------------------------------------------------------------------ No results for input(s): "CHOL", "HDL", "LDLCALC", "TRIG", "CHOLHDL", "LDLDIRECT" in the last 72 hours.  Lab Results  Component Value Date   HGBA1C 6.5 (H) 01/07/2022   Micro Results Recent Results (from the past 240 hour(s))  Aerobic/Anaerobic Culture w Gram Stain (surgical/deep wound)     Status: None (Preliminary result)   Collection Time:  04/01/22  3:33 PM   Specimen: PATH Other; Tissue  Result Value Ref Range Status   Specimen Description FINGER  Final   Special Requests RIGHT INDEX FINGER  Final   Gram Stain   Final    RARE WBC PRESENT,BOTH PMN AND MONONUCLEAR  RARE GRAM POSITIVE COCCI Performed at [facility], [place] [address], [place], [place] [address]    Culture   Final    RARE GRAM NEGATIVE RODS IDENTIFICATION AND SUSCEPTIBILITIES TO FOLLOW NO ANAEROBES ISOLATED; CULTURE IN PROGRESS FOR 5 DAYS    Report Status PENDING  Incomplete  Culture, blood (Routine X 2) w Reflex to ID Panel     Status: None (Preliminary result)   Collection Time: 04/01/22  9:26 PM   Specimen: BLOOD  Result Value Ref Range Status   Specimen Description BLOOD SITE NOT SPECIFIED  Final   Special Requests   Final    BOTTLES DRAWN AEROBIC AND ANAEROBIC Blood Culture results may not be optimal due to an inadequate volume of blood received in culture bottles   Culture   Final    NO GROWTH 3 DAYS Performed at [facility], [place] [address], [place], [place] [address]    Report Status PENDING  Incomplete    Radiology Reports No results found.

## 2022-04-04 NOTE — Progress Notes (Signed)
Pharmacy Antibiotic Note  Edward [name] is a 51 y.o. male admitted on 04/01/2022 right hand infection, prior amputation of R index finger through proximal phalanx. Continued pain and ischemia. Now s/p amputation of R index finger through MP point, I&D of R index finger and hand/palm, and irrigation and packing of R small finger wound infection. Pharmacy has been consulted for Vancomycin and Cefepime dosing.    Purulence noted in OR > culture sent.   ESRD, usual TTS hemodialysis.   WBC wnl, Afebrile  Plan: Continue Cefepime 1gm IV q24h - after HD on HD days Continue Vancomycin 1000 mg IV qHD TTS Follow culture data, clinical progress and antibiotic plans.  Height: '6\' 1"'$  (185.4 cm) Weight: 100 kg (220 lb 7.4 oz) IBW/kg (Calculated) : 79.9  Temp (24hrs), Avg:97.9 F (36.6 C), Min:97.4 F (36.3 C), Max:98.2 F (36.8 C)   Allergies  Allergen Reactions   Crestor [Rosuvastatin] Rash    Also had a red eye   Dilaudid [Hydromorphone] Itching   Benadryl [Diphenhydramine] Rash    Antimicrobials this admission:  Cefazolin x 1 pre-op on 8/14  Vancomycin 8/14>>  Cefepime 8/14 >>  Microbiology results:  8/14 surgical/deep wound (finger): Pantoea agglomerans (pan-sens)  8/14 blood: ngtd x3d   Thank you for allowing pharmacy to be a part of this patient's care.  Luisa Hart, PharmD, BCPS Clinical Pharmacist 04/04/2022 3:25 PM   Please refer to AMION for pharmacy phone number

## 2022-04-04 NOTE — Progress Notes (Signed)
  [facility] Progress Note    Assessment/ Plan:   ESRD:  -Outpatient HD orders:[place], TTS.  4.5 hours.  F180.  Flow rates: 425/700.  2K/2 Cal.  LUE AVF.  Meds: Mircera 75 mcg every 2 weeks (last dose 8/1).  Heparin: 8000 units q. treatment, 2000 units mid run -HD on TTS schedule   Hand abscess -I&D 8/14, antibiotics per primary service. -vanc and cefepime for 2-3 weeks   Volume/ hypertension: BP currently acceptable, will UF as tolerated   Anemia of Chronic Kidney Disease: drop in hgb previously likely related to surgery.  Started Aranesp 8/15. Avoid IV Fe for now. Hgb 10.3   Secondary Hyperparathyroidism/Hyperphosphatemia: on velphoro at home.  started [place] here. Check PO4   Generalized skin itching -Occurred after receiving Vicodin.  Per primary. Resolved/improved   DM2, insulin-dependent -Per primary  Subjective:   Patient seen and examined in HD unit prior to being set up for HD. No complaints, reports feeling well. He's okay with our UF goals today.   Objective:   BP 115/66 (BP Location: Right Arm)   Pulse 76   Temp 97.7 F (36.5 C) (Oral)   Resp 17   Ht '6\' 1"'$  (1.854 m)   Wt 98.8 kg   SpO2 100%   BMI 28.74 kg/m  No intake or output data in the 24 hours ending 04/04/22 1020  Weight change:   Physical Exam: Gen:NAD CVS:RRR Resp:CTA BL ACZ:YSAY, nd/nt Ext:no sig edema, right hand dressing in place, rt bka, left hand digit amputations, left TMA Neuro: awake alert Dialysis access: LUE AVF +b/t  Imaging: No results found.  Labs: BMET Recent Labs  Lab 04/01/22 1254 04/01/22 2126 04/02/22 0205 04/03/22 0146 04/04/22 0106  NA 134*  --  134* 135 135  K 4.4  --  4.5 4.3 4.2  CL 97*  --  92* 93* 92*  CO2  --   --  20* 24 23  GLUCOSE 61*  --  106* 80 105*  BUN 77*  --  77* 45* 63*  CREATININE 12.30* 12.04* 11.87* 7.93* 9.95*  CALCIUM  --   --  8.8* 9.3 9.2   CBC Recent Labs  Lab 04/01/22 2126 04/02/22 0205 04/03/22 0146  04/04/22 0106  WBC 6.4 5.4 6.0 5.4  NEUTROABS  --  4.0 4.6 3.7  HGB 9.9* 9.0* 9.8* 10.3*  HCT 29.4* 27.1* 29.7* 31.8*  MCV 91.9 93.1 93.7 95.2  PLT 191 193 218 235    Medications:     allopurinol  100 mg Oral Daily   vitamin C  1,000 mg Oral Daily   aspirin  81 mg Oral Daily   atorvastatin  80 mg Oral q AM   carvedilol  3.125 mg Oral BID WC   clopidogrel  75 mg Oral Daily   darbepoetin (ARANESP) injection - DIALYSIS  60 mcg Intravenous Q Tue-HD   ezetimibe  10 mg Oral Daily   ferric citrate  630 mg Oral TID WC   gabapentin  100 mg Oral TID   heparin injection (subcutaneous)  5,000 Units Subcutaneous Q8H   insulin aspart  0-9 Units Subcutaneous TID WC   insulin aspart protamine- aspart  8 Units Subcutaneous BID WC   linaclotide  72 mcg Oral q AM   loratadine  10 mg Oral Daily   pantoprazole  40 mg Oral Daily   traMADol  50 mg Oral Q6H      Gean Quint, MD [facility] 04/04/2022, 10:20 AM

## 2022-04-04 NOTE — Care Management Important Message (Signed)
Important Message  Patient Details  Name: Edward [name] MRN: [id number] Date of Birth: 1971/08/17   Medicare Important Message Given:  Yes     Hannah Beat 04/04/2022, 2:10 PM

## 2022-04-04 NOTE — Progress Notes (Signed)
[place] for Infectious Disease  Date of Admission:  04/01/2022     Abx: 8/14-c vanc 8/14-c cefepime                                                              Assessment: 51 yo male with PAD, esrd on iHD via left UE AVF, dm2, cad, chf, dry gangrene of right of ex s/p right aka, left tma, several fingers, an dmost recently right index finger by mtp disarticulation 8/14 found to have an abscess at that area so admitted for abx   Patient at this time also has the right hand ring finger dry gangrene   Orthopedics team planning repeat surgery this week   admitted again with right hand abscess   His left hand/tma stump and right aka stump don't seem to have any infectious issue at this time, despite several calcified ulcers on LLE and left hand   He doesn't appear septic at this time. Bcx ngtd. The 8/14 wound cx is growing gpc and gnr    ------------------- 8/17 assessment There is no further orthopedic procedure planned His admission operative cx is growing staph epi and pantoa  I discussed with Dr Candiss Norse if no concern of proximal index finger osseous involvement could do total 10 days of abx since surgical date  If staph epi is oxacillin resistant could do doxy -- however if no osseous involvement I do no see need to cover staph epi even, in soft tissue process alone now that he is I&D'ed. bactrim would be sufficient in this case then     Plan: If xray of hand showed no proximal 2nd ray osseous involvement could discharge on bactrim (pantoa is amp-c induceable) to finish 10 day of abx starting 8/14 Given ongoing dry gangrene, he might need periodic f/u with either ortho or vascular surgery No need for id follow up at this time Discussed with primary team  I spent more than 35 minute reviewing data/chart, and coordinating care and >50% direct face to face time providing counseling/discussing diagnostics/treatment plan with patient   Principal Problem:    Hand abscess Active Problems:   Diabetes mellitus with complication, with long-term current use of insulin (HCC)   S/P transmetatarsal amputation of foot, left (HCC)   Diabetic polyneuropathy associated with type 2 diabetes mellitus ([place])   Anemia of chronic disease   S/P BKA (below knee amputation) unilateral, right (HCC)   Chronic combined systolic and diastolic CHF (congestive heart failure) (HCC)   CAD S/P percutaneous coronary angioplasty   End stage renal disease ([place])   PAD (peripheral artery disease) (HCC)   Allergies  Allergen Reactions   Crestor [Rosuvastatin] Rash    Also had a red eye   Dilaudid [Hydromorphone] Itching   Benadryl [Diphenhydramine] Rash    Scheduled Meds:  allopurinol  100 mg Oral Daily   vitamin C  1,000 mg Oral Daily   aspirin  81 mg Oral Daily   atorvastatin  80 mg Oral q AM   carvedilol  3.125 mg Oral BID WC   clopidogrel  75 mg Oral Daily   darbepoetin (ARANESP) injection - DIALYSIS  60 mcg Intravenous Q Tue-HD   ezetimibe  10 mg Oral Daily   ferric citrate  630 mg Oral TID WC   gabapentin  100 mg Oral TID   heparin injection (subcutaneous)  5,000 Units Subcutaneous Q8H   insulin aspart  0-9 Units Subcutaneous TID WC   insulin aspart protamine- aspart  8 Units Subcutaneous BID WC   linaclotide  72 mcg Oral q AM   loratadine  10 mg Oral Daily   pantoprazole  40 mg Oral Daily   traMADol  50 mg Oral Q6H   Continuous Infusions:  anticoagulant sodium citrate     ceFEPime (MAXIPIME) IV 1 g (04/03/22 2132)   vancomycin 1,000 mg (04/04/22 1312)   PRN Meds:.acetaminophen, alteplase, anticoagulant sodium citrate, heparin, heparin, lidocaine (PF), lidocaine-prilocaine, magnesium hydroxide, morphine injection, ondansetron **OR** ondansetron (ZOFRAN) IV, pentafluoroprop-tetrafluoroeth   SUBJECTIVE: No surgery planned further No complain from patient Afebrile   Review of Systems: ROS All other ROS was negative, except mentioned  above     OBJECTIVE: Vitals:   04/04/22 1212 04/04/22 1242 04/04/22 1311 04/04/22 1348  BP: 126/67 117/68 127/73 127/69  Pulse: 75 76 77 77  Resp: '11 16 20 10  '$ Temp:      TempSrc:      SpO2:    91%  Weight:      Height:       Body mass index is 29.9 kg/m.  Physical Exam General/constitutional: no distress, pleasant; getting dialysis HEENT: Normocephalic, PER, Conj Clear, EOMI, Oropharynx clear Neck supple CV: rrr no mrg Lungs: clear to auscultation, normal respiratory effort Abd: Soft, Nontender Ext: no edema Skin/msk: dressing clean/dry right hand; no new cellulitis change from previous right index finger dry gangrene, left hand or LLE ulcers Neuro: nonfocal    Lab Results Lab Results  Component Value Date   WBC 5.4 04/04/2022   HGB 10.3 (L) 04/04/2022   HCT 31.8 (L) 04/04/2022   MCV 95.2 04/04/2022   PLT 235 04/04/2022    Lab Results  Component Value Date   CREATININE 9.95 (H) 04/04/2022   BUN 63 (H) 04/04/2022   NA 135 04/04/2022   K 4.2 04/04/2022   CL 92 (L) 04/04/2022   CO2 23 04/04/2022    Lab Results  Component Value Date   ALT 6 04/04/2022   AST 13 (L) 04/04/2022   ALKPHOS 82 04/04/2022   BILITOT 1.0 04/04/2022      Microbiology: Recent Results (from the past 240 hour(s))  Aerobic/Anaerobic Culture w Gram Stain (surgical/deep wound)     Status: None (Preliminary result)   Collection Time: 04/01/22  3:33 PM   Specimen: PATH Other; Tissue  Result Value Ref Range Status   Specimen Description FINGER  Final   Special Requests RIGHT INDEX FINGER  Final   Gram Stain   Final    RARE WBC PRESENT,BOTH PMN AND MONONUCLEAR RARE GRAM POSITIVE COCCI Performed at [facility], [address] [place], [place] [address]    Culture   Final    RARE PANTOEA AGGLOMERANS NO ANAEROBES ISOLATED; CULTURE IN PROGRESS FOR 5 DAYS    Report Status PENDING  Incomplete   Organism ID, Bacteria PANTOEA AGGLOMERANS  Final      Susceptibility   Pantoea  agglomerans - MIC*    CEFAZOLIN <=4 SENSITIVE Sensitive     CEFEPIME <=0.12 SENSITIVE Sensitive     CEFTAZIDIME <=1 SENSITIVE Sensitive     CEFTRIAXONE <=0.25 SENSITIVE Sensitive     CIPROFLOXACIN <=0.25 SENSITIVE Sensitive     GENTAMICIN <=1 SENSITIVE Sensitive     IMIPENEM <=0.25 SENSITIVE Sensitive  TRIMETH/SULFA <=20 SENSITIVE Sensitive     PIP/TAZO <=4 SENSITIVE Sensitive     * RARE PANTOEA AGGLOMERANS  Culture, blood (Routine X 2) w Reflex to ID Panel     Status: None (Preliminary result)   Collection Time: 04/01/22  9:26 PM   Specimen: BLOOD  Result Value Ref Range Status   Specimen Description BLOOD SITE NOT SPECIFIED  Final   Special Requests   Final    BOTTLES DRAWN AEROBIC AND ANAEROBIC Blood Culture results may not be optimal due to an inadequate volume of blood received in culture bottles   Culture   Final    NO GROWTH 3 DAYS Performed at [facility], [place] [address], [place], [place] [address]    Report Status PENDING  Incomplete     Serology:   Imaging: If present, new imagings (plain films, ct scans, and mri) have been personally visualized and interpreted; radiology reports have been reviewed. Decision making incorporated into the Impression / Recommendations.  Xray right hand pending   Jabier Mutton, [place] [facility] [place] [phone] pager    04/04/2022, 2:26 PM

## 2022-04-04 NOTE — Progress Notes (Signed)
Pt in bed no c/os at this time vitals stable avf +/+ ufg 3.5L

## 2022-04-04 NOTE — Progress Notes (Signed)
Received patient in bed to unit.  Alert and oriented.  Informed consent signed and in  chart.   Treatment initiated: 1030 Treatment completed: 1430  Patient tolerated well.  Transported back to the room  alert, without acute distress.  Hand-off given to patient's nurse.   Access used: RAVF Access issues: none  Total UF removed: 3.5L Medication(s) given: Vancomycin Post HD VS: 123/59 79 98T 16  Post HD weight: 100kg   [facility]

## 2022-04-05 ENCOUNTER — Other Ambulatory Visit (HOSPITAL_COMMUNITY): Payer: Self-pay

## 2022-04-05 DIAGNOSIS — L02519 Cutaneous abscess of unspecified hand: Secondary | ICD-10-CM | POA: Diagnosis not present

## 2022-04-05 LAB — CBC WITH DIFFERENTIAL/PLATELET
Abs Immature Granulocytes: 0.04 10*3/uL (ref 0.00–0.07)
Basophils Absolute: 0 10*3/uL (ref 0.0–0.1)
Basophils Relative: 0 %
Eosinophils Absolute: 0.3 10*3/uL (ref 0.0–0.5)
Eosinophils Relative: 5 %
HCT: 31.1 % — ABNORMAL LOW (ref 39.0–52.0)
Hemoglobin: 10.1 g/dL — ABNORMAL LOW (ref 13.0–17.0)
Immature Granulocytes: 1 %
Lymphocytes Relative: 9 %
Lymphs Abs: 0.6 10*3/uL — ABNORMAL LOW (ref 0.7–4.0)
MCH: 30.9 pg (ref 26.0–34.0)
MCHC: 32.5 g/dL (ref 30.0–36.0)
MCV: 95.1 fL (ref 80.0–100.0)
Monocytes Absolute: 0.7 10*3/uL (ref 0.1–1.0)
Monocytes Relative: 11 %
Neutro Abs: 5.1 10*3/uL (ref 1.7–7.7)
Neutrophils Relative %: 74 %
Platelets: 222 10*3/uL (ref 150–400)
RBC: 3.27 MIL/uL — ABNORMAL LOW (ref 4.22–5.81)
RDW: 15.9 % — ABNORMAL HIGH (ref 11.5–15.5)
WBC: 6.8 10*3/uL (ref 4.0–10.5)
nRBC: 0 % (ref 0.0–0.2)

## 2022-04-05 LAB — COMPREHENSIVE METABOLIC PANEL
ALT: 6 U/L (ref 0–44)
AST: 14 U/L — ABNORMAL LOW (ref 15–41)
Albumin: 2.8 g/dL — ABNORMAL LOW (ref 3.5–5.0)
Alkaline Phosphatase: 76 U/L (ref 38–126)
Anion gap: 17 — ABNORMAL HIGH (ref 5–15)
BUN: 38 mg/dL — ABNORMAL HIGH (ref 6–20)
CO2: 26 mmol/L (ref 22–32)
Calcium: 9.1 mg/dL (ref 8.9–10.3)
Chloride: 93 mmol/L — ABNORMAL LOW (ref 98–111)
Creatinine, Ser: 7.32 mg/dL — ABNORMAL HIGH (ref 0.61–1.24)
GFR, Estimated: 8 mL/min — ABNORMAL LOW (ref >60)
Glucose, Bld: 97 mg/dL (ref 70–99)
Potassium: 4.2 mmol/L (ref 3.5–5.1)
Sodium: 136 mmol/L (ref 135–145)
Total Bilirubin: 0.9 mg/dL (ref 0.3–1.2)
Total Protein: 6.9 g/dL (ref 6.5–8.1)

## 2022-04-05 LAB — GLUCOSE, CAPILLARY
Glucose-Capillary: 101 mg/dL — ABNORMAL HIGH (ref 70–99)
Glucose-Capillary: 80 mg/dL (ref 70–99)
Glucose-Capillary: 90 mg/dL (ref 70–99)

## 2022-04-05 LAB — MAGNESIUM: Magnesium: 1.9 mg/dL (ref 1.7–2.4)

## 2022-04-05 MED ORDER — SULFAMETHOXAZOLE-TRIMETHOPRIM 800-160 MG PO TABS
2.0000 | ORAL_TABLET | Freq: Every day | ORAL | 0 refills | Status: AC
  Filled 2022-04-05: qty 10, 5d supply, fill #0

## 2022-04-05 MED ORDER — SULFAMETHOXAZOLE-TRIMETHOPRIM 800-160 MG PO TABS: 2.0000 | ORAL_TABLET | Freq: Every day | ORAL | Status: DC

## 2022-04-05 MED ORDER — SULFAMETHOXAZOLE-TRIMETHOPRIM 800-160 MG PO TABS
2.0000 | ORAL_TABLET | Freq: Every day | ORAL | Status: DC
Start: 2022-04-05 — End: 2022-04-05
  Administered 2022-04-05: 2 via ORAL
  Filled 2022-04-05: qty 2

## 2022-04-05 NOTE — TOC Progression Note (Addendum)
Transition of Care [facility]) - Progression Note    Patient Details  Name: Edward [name] MRN: [id number] Date of Birth: [birth year]  [facility] CM/SW Contact  Jacalyn Lefevre Edson Snowball, RN Phone Number: 04/05/2022, 7:41 AM  Clinical Narrative:     [place] with [facility] has accepted patient  for home health services . [facility] aware discharge is today    Expected Discharge Plan: [place] Barriers to Discharge: Continued Medical Work up  Expected Discharge Plan and Services Expected Discharge Plan: [place]   Discharge Planning Services: CM Consult Post Acute Care Choice: [place] arrangements for the past 2 months: Single Family Home                 DME Arranged: N/A DME Agency: NA       HH Arranged: PT, OT           Social Determinants of Health (SDOH) Interventions    Readmission Risk Interventions    04/02/2022   10:33 AM 01/14/2022   12:26 PM  Readmission Risk Prevention Plan  Transportation Screening Complete Complete  PCP or Specialist Appt within 3-5 Days  Complete  HRI or [place]  Complete  Social Work Consult for [place] Planning/Counseling  Complete  Palliative Care Screening  Not Applicable  Medication Review Press photographer) Complete Referral to Pharmacy  PCP or Specialist appointment within 3-5 days of discharge Complete   HRI or [place] Complete   SW Recovery Care/Counseling Consult Complete   [place] Not Applicable

## 2022-04-05 NOTE — Progress Notes (Signed)
[facility] Progress Note   Subjective:   Patient seen and examined at bedside.  Reports he is going home today.  Feeling ok, minimal pain in R hand.  Biggest complaint is itching.  Denies fever, chills, CP, SOB, abdominal pain and n/v/d.    Objective Vitals:   04/04/22 1744 04/04/22 2014 04/05/22 0600 04/05/22 0837  BP: (!) 117/59 124/62 (!) 98/56 (!) 110/54  Pulse: 82 80 79 78  Resp: '17 18 18 17  '$ Temp: (!) 97.4 F (36.3 C) 97.9 F (36.6 C) 98.2 F (36.8 C) 98.4 F (36.9 C)  TempSrc: Oral Oral Oral Oral  SpO2: 97% 94% 93% 98%  Weight:      Height:       Physical Exam General:chronically ill appearing male in NAD Heart:RRR, no mrg Lungs:CTAB, nml WOB on RA Abdomen:soft, NTND Extremities:R BKA, 1+ LLE edema, L TMA, R hand dressed Dialysis Access: LU AVF +b/t   Filed Weights   04/04/22 1015 04/04/22 1033 04/04/22 1440  Weight: 102.8 kg 102.8 kg 100 kg    Intake/Output Summary (Last 24 hours) at 04/05/2022 0940 Last data filed at 04/05/2022 0000 Gross per 24 hour  Intake 1120 ml  Output 3500 ml  Net -2380 ml    Additional Objective Labs: Basic Metabolic Panel: Recent Labs  Lab 04/03/22 0146 04/04/22 0106 04/05/22 0129  NA 135 135 136  K 4.3 4.2 4.2  CL 93* 92* 93*  CO2 '24 23 26  '$ GLUCOSE 80 105* 97  BUN 45* 63* 38*  CREATININE 7.93* 9.95* 7.32*  CALCIUM 9.3 9.2 9.1   Liver Function Tests: Recent Labs  Lab 04/03/22 0146 04/04/22 0106 04/05/22 0129  AST 15 13* 14*  ALT '8 6 6  '$ ALKPHOS 85 82 76  BILITOT 0.6 1.0 0.9  PROT 7.3 7.5 6.9  ALBUMIN 2.9* 3.0* 2.8*   CBC: Recent Labs  Lab 04/01/22 2126 04/01/22 2126 04/02/22 0205 04/03/22 0146 04/04/22 0106 04/05/22 0129  WBC 6.4  --  5.4 6.0 5.4 6.8  NEUTROABS  --    < > 4.0 4.6 3.7 5.1  HGB 9.9*  --  9.0* 9.8* 10.3* 10.1*  HCT 29.4*  --  27.1* 29.7* 31.8* 31.1*  MCV 91.9  --  93.1 93.7 95.2 95.1  PLT 191  --  193 218 235 222   < > = values in this interval not displayed.   Blood  Culture    Component Value Date/Time   SDES BLOOD SITE NOT SPECIFIED 04/01/2022 2126   SPECREQUEST  04/01/2022 2126    BOTTLES DRAWN AEROBIC AND ANAEROBIC Blood Culture results may not be optimal due to an inadequate volume of blood received in culture bottles   CULT  04/01/2022 2126    NO GROWTH 4 DAYS Performed at [facility], [place] [address], [place], [place] [address]    REPTSTATUS PENDING 04/01/2022 2126    CBG: Recent Labs  Lab 04/04/22 1749 04/04/22 2115 04/05/22 0011 04/05/22 0555 04/05/22 0842  GLUCAP 70 73 80 101* 90   Iron Studies: No results for input(s): "IRON", "TIBC", "TRANSFERRIN", "FERRITIN" in the last 72 hours. Lab Results  Component Value Date   INR 1.2 09/28/2020   INR 1.3 (H) 09/27/2020   INR 1.6 (H) 09/26/2020   Studies/Results: DG Hand Complete Right  Result Date: 04/04/2022 CLINICAL DATA:  Evaluate for signs of osteomyelitis EXAM: RIGHT HAND - COMPLETE 3+ VIEW COMPARISON:  Images of previous study done on 04/13/2001 not available for review. FINDINGS:  No recent fracture is seen. There is disarticulation of the right index finger. There is removal of head of the proximal phalanx of the middle finger along with the middle and distal phalanges. There is disarticulation and removal of fifth finger distal to the level of PIP joint. There are no focal lytic lesions. Arterial calcifications are seen in soft tissues. There are possible soft tissue calcifications adjacent to the head of the second metacarpal. Deformity in the base of fifth metacarpal most likely is from old healed fracture. IMPRESSION: There is previous removal of right index finger and partial removal of right middle and fifth fingers. No recent fracture is seen. There are no focal lytic lesions. If there is clinical suspicion for osteomyelitis, follow-up MRI may be considered. Electronically Signed   By: Elmer Picker M.D.   On: 04/04/2022 16:14    Medications:  anticoagulant sodium  citrate      allopurinol  100 mg Oral Daily   vitamin C  1,000 mg Oral Daily   aspirin  81 mg Oral Daily   atorvastatin  80 mg Oral q AM   carvedilol  3.125 mg Oral BID WC   clopidogrel  75 mg Oral Daily   darbepoetin (ARANESP) injection - DIALYSIS  60 mcg Intravenous Q Tue-HD   ezetimibe  10 mg Oral Daily   ferric citrate  630 mg Oral TID WC   gabapentin  100 mg Oral TID   heparin injection (subcutaneous)  5,000 Units Subcutaneous Q8H   insulin aspart  0-9 Units Subcutaneous TID WC   insulin aspart protamine- aspart  8 Units Subcutaneous BID WC   linaclotide  72 mcg Oral q AM   loratadine  10 mg Oral Daily   pantoprazole  40 mg Oral Daily   sulfamethoxazole-trimethoprim  2 tablet Oral QHS   traMADol  50 mg Oral Q6H    Dialysis Orders: [place], TTS.  4.5 hours.  F180.  Flow rates: 425/700.  2K/2 Cal.  LUE AVF.   Meds: Mircera 75 mcg every 2 weeks (last dose 8/1).  Heparin: 8000 units q. treatment, 2000 units mid run  Assessment/Plan: 1. Hand abscess - s/p I&D on 8/14.  Dr. Fredna Dow following. Plan for PO Bactrim x 10 days.  F/U Dr. Fredna Dow in 2-3 days. 2. ESRD - on HD TTS.  HD tomorrow per regular schedule.  3. Anemia of CKD- Hgb 10.1. Aranesp given on 8/15.   4. Secondary hyperparathyroidism - Ca in goal. Continue binders, started on [place] here, on velphoro at home.  5. HTN/volume - BP soft.  On low dose carvedilol only. UF as tolerated.  6. Nutrition - Renal diet w/fluid restriction. 7. Generalized itching - previously on Korsuva as outpatient.    Jen Mow, PA-C [facility] 04/05/2022,9:40 AM  LOS: 4 days

## 2022-04-05 NOTE — Progress Notes (Signed)
PT Cancellation Note  Patient Details Name: Edward [name] MRN: [id number] DOB: 03-Apr-1971   Cancelled Treatment:    Reason Eval/Treat Not Completed: Other (comment) Pt planning to d/c this morning. Seen for lateral scoot transfer and car transfer practice. Upon arrival, transfer technique discussed and pt and his son report they are confident with transfer technique and have no further questions. Session held this morning to allow for timely d/c. Pt and family report all questions answered and no further needs.   [place] Carbo, PT, DPT   Acute Rehabilitation Department  Sandra Cockayne 04/05/2022, 11:29 AM

## 2022-04-05 NOTE — Discharge Summary (Signed)
Edward [name]:[id number] DOB: 1971/03/31 DOA: 04/01/2022  PCP: [name], MD  Admit date: 04/01/2022  Discharge date: 04/05/2022  Admitted From: Home   Disposition:  Home   Recommendations for Outpatient Follow-up:   Follow up with PCP in 1-2 weeks  PCP Please obtain BMP/CBC, 2 view CXR in 1week,  (see Discharge instructions)   PCP Please follow up on the following pending results:    Home Health: None   Equipment/Devices: None  Consultations: ID, Orthopedics, Renal Discharge Condition: Stable    CODE STATUS: Full    Diet Recommendation: Renal-low carbohydrate, 1500 cc fluid restriction  CC - direct admit from OR for hand abscess    Brief history of present illness from the day of admission and additional interim summary    51 y.o. male, history of severe PAD s/p left transmetatarsal amputation, right BKA with necrotic stump, multiple amputation in left hand fingers, ESRD on TTS schedule for dialysis, DM type 2 insulin-dependent, dyslipidemia, CAD with MI in past, combined systolic and diastolic CHF EF 24%, orthostatic hypotension of chronic disease, AAA, recent Zio patch placement, who was brought in for an elective R digit amputation, by hand surgeon Dr. Fredna Dow, during surgery he was found to have a lot of pus in his right hand, I was requested to admit him for IV antibiotics followed by a repeat surgery later this week if required.                                                                 Hospital Course    1.  Hand abscess found during elective procedure which was right index finger amputation through MP joint, right index finger and hand incision and drainage done on 04/01/2022 by hand surgeon Dr. Fredna Dow.  He currently does not have any systemic symptoms, he is afebrile, no chills, and did not  exhibit any signs of systemic infection.  He was seen by ID.  He was kept here on empiric IV antibiotics, postop hand x-ray stable Case discussed with Dr. Fredna Dow and ID plan is already on oral Bactrim as directed by ID.  After discharge patient will follow-up with Dr. Fredna Dow within the next 2 to 3 days for further dressing changes, currently symptom-free and back to baseline   2.  PAD with multiple previous amputations including multiple upper extremity digits in both hands, right BKA, left transmetatarsal amputation.  Continue his home dual antiplatelet therapy and statin for secondary prevention.   3.  ESRD.  On TTS schedule.  Nephrology has been consulted.   4.  CAD with ischemic cardiomyopathy, chronic combined systolic and diastolic heart failure last known EF around 45%.  Stable and compensated.  Continue dual antiplatelet therapy, Zetia and statin for secondary prevention, usually has issues with low blood pressure currently blood  pressure is stable will challenge with low-dose Coreg and monitor.  No acute issues he is clinically stable.   5.  Dyslipidemia.  Continue combination of statin and Zetia.   6.  Generalized skin itching.  Likely due to Vicodin, discontinued, resolved.    7. DM type II.  Insulin-dependent.  Continue less than home dose twice daily 70/30+ sensitive sliding scale. Dose  reduced further on 04/03/2022, question his dietary compliance at home as he is almost at one third his home dose.   Discharge diagnosis     Principal Problem:   Hand abscess Active Problems:   Diabetes mellitus with complication, with long-term current use of insulin (HCC)   S/P transmetatarsal amputation of foot, left (HCC)   Diabetic polyneuropathy associated with type 2 diabetes mellitus (HCC)   Anemia of chronic disease   S/P BKA (below knee amputation) unilateral, right (HCC)   Chronic combined systolic and diastolic CHF (congestive heart failure) (HCC)   CAD S/P percutaneous coronary  angioplasty   End stage renal disease ([place])   PAD (peripheral artery disease) ([place])    Discharge instructions    Discharge Instructions     Discharge instructions   Complete by: As directed    Keep your right hand clean and dry at all times.  Follow-up with Dr. Fredna Dow within a 2- 3 days for dressing change.    Follow with Primary MD [name], MD in 7 days   Get CBC, CMP, 2 view Chest X ray -  checked next visit within 1 week by Primary MD   Activity: As tolerated with Full fall precautions use walker/cane & assistance as needed  Disposition Home   Diet: Renal-low carbohydrate diet, 1500 cc fluid restriction per day.  Check CBGs q. [place].  Special Instructions: If you have smoked or chewed Tobacco  in the last 2 yrs please stop smoking, stop any regular Alcohol  and or any Recreational drug use.  On your next visit with your primary care physician please Get Medicines reviewed and adjusted.  Please request your Prim.MD to go over all Hospital Tests and Procedure/Radiological results at the follow up, please get all Hospital records sent to your Prim MD by signing hospital release before you go home.  If you experience worsening of your admission symptoms, develop shortness of breath, life threatening emergency, suicidal or homicidal thoughts you must seek medical attention immediately by calling 911 or calling your MD immediately  if symptoms less severe.  You Must read complete instructions/literature along with all the possible adverse reactions/side effects for all the Medicines you take and that have been prescribed to you. Take any new Medicines after you have completely understood and accpet all the possible adverse reactions/side effects.   Discharge wound care:   Complete by: As directed    Keep your right hand clean and dry at all times, follow-up with Dr. Fredna Dow your hand surgeon within 2 to 3 days for dressing change.   Increase activity slowly   Complete by: As  directed        Discharge Medications   Allergies as of 04/05/2022       Reactions   Crestor [rosuvastatin] Rash   Also had a red eye   Dilaudid [hydromorphone] Itching   Benadryl [diphenhydramine] Rash        Medication List     TAKE these medications    acetaminophen 500 MG tablet Commonly known as: TYLENOL Take 500-1,000 mg by mouth every 6 (six) hours  as needed for moderate pain or headache.   allopurinol 100 MG tablet Commonly known as: ZYLOPRIM Take 100 mg by mouth daily.   aspirin 81 MG chewable tablet Chew 81 mg by mouth daily.   atorvastatin 80 MG tablet Commonly known as: LIPITOR Take 80 mg by mouth in the morning.   bismuth subsalicylate 947 SJ/[id number] suspension Commonly known as: PEPTO BISMOL Take 30 mLs by mouth every 6 (six) hours as needed for indigestion.   blood glucose meter kit and supplies Dispense based on patient and insurance preference. Use up to four times daily as directed. (FOR ICD-9 250.00, 250.01).   clopidogrel 75 MG tablet Commonly known as: PLAVIX TAKE ONE TABLET BY MOUTH ONCE DAILY   ezetimibe 10 MG tablet Commonly known as: ZETIA Take 1 tablet (10 mg total) by mouth daily.   gabapentin 100 MG capsule Commonly known as: NEURONTIN Take 100 mg by mouth 3 (three) times daily.   Insulin Pen Needle 31G X 5 MM Misc Use daily to inject insulin as instructed   linaclotide 72 MCG capsule Commonly known as: LINZESS Take 1 capsule (72 mcg total) by mouth daily before breakfast. What changed: when to take this   NovoLOG Mix 70/30 FlexPen (70-30) 100 UNIT/ML FlexPen Generic drug: insulin aspart protamine - aspart Inject 0.3 mLs (30 Units total) into the skin 2 (two) times daily with a meal.   pantoprazole 40 MG tablet Commonly known as: PROTONIX TAKE ONE TABLET BY MOUTH EVERY MORNING and TAKE ONE TABLET BY MOUTH EVERYDAY AT BEDTIME What changed: See the new instructions.   sulfamethoxazole-trimethoprim 800-160 MG  tablet Commonly known as: BACTRIM DS Take 2 tablets by mouth at bedtime for 5 doses.   traMADol 50 MG tablet Commonly known as: ULTRAM Take 50 mg by mouth every 6 (six) hours as needed for pain.   triamcinolone ointment 0.1 % Commonly known as: KENALOG 1 application. daily as needed (rash).   Velphoro 500 MG chewable tablet Generic drug: sucroferric oxyhydroxide Chew 1,000 mg by mouth 3 (three) times daily with meals.               Discharge Care Instructions  (From admission, onward)           Start     Ordered   04/05/22 0000  Discharge wound care:       Comments: Keep your right hand clean and dry at all times, follow-up with Dr. Fredna Dow your hand surgeon within 2 to 3 days for dressing change.   04/05/22 [place] [place] [facility] Follow up.   Specialty: Home Health Services Contact information: [address] [place] [address] [phone]         [name], MD. Schedule an appointment as soon as possible for a visit in 1 week(s).   Specialty: Family Medicine Contact information: [address] [place] [address] [phone]         Lelon Perla, MD .   Specialty: Cardiology Contact information: [address] [place] [address] [phone]         Leanora Cover, MD. Schedule an appointment as soon as possible for a visit in 2 day(s).   Specialty: Orthopedic Surgery Contact information: [address] [place] [address] [phone]  Major procedures and Radiology Reports - PLEASE review detailed and final reports thoroughly  -      DG Hand Complete Right  Result Date: 04/04/2022 CLINICAL DATA:  Evaluate for signs of osteomyelitis EXAM: RIGHT HAND - COMPLETE 3+ VIEW COMPARISON:  Images of previous study done on 04/13/2001 not available for review. FINDINGS: No recent fracture is seen. There is  disarticulation of the right index finger. There is removal of head of the proximal phalanx of the middle finger along with the middle and distal phalanges. There is disarticulation and removal of fifth finger distal to the level of PIP joint. There are no focal lytic lesions. Arterial calcifications are seen in soft tissues. There are possible soft tissue calcifications adjacent to the head of the second metacarpal. Deformity in the base of fifth metacarpal most likely is from old healed fracture. IMPRESSION: There is previous removal of right index finger and partial removal of right middle and fifth fingers. No recent fracture is seen. There are no focal lytic lesions. If there is clinical suspicion for osteomyelitis, follow-up MRI may be considered. Electronically Signed   By: Elmer Picker M.D.   On: 04/04/2022 16:14    Micro Results    Recent Results (from the past 240 hour(s))  Aerobic/Anaerobic Culture w Gram Stain (surgical/deep wound)     Status: None (Preliminary result)   Collection Time: 04/01/22  3:33 PM   Specimen: PATH Other; Tissue  Result Value Ref Range Status   Specimen Description FINGER  Final   Special Requests RIGHT INDEX FINGER  Final   Gram Stain   Final    RARE WBC PRESENT,BOTH PMN AND MONONUCLEAR RARE GRAM POSITIVE COCCI Performed at [facility], [address] [place], [place] [address]    Culture   Final    RARE PANTOEA AGGLOMERANS NO ANAEROBES ISOLATED; CULTURE IN PROGRESS FOR 5 DAYS    Report Status PENDING  Incomplete   Organism ID, Bacteria PANTOEA AGGLOMERANS  Final      Susceptibility   Pantoea agglomerans - MIC*    CEFAZOLIN <=4 SENSITIVE Sensitive     CEFEPIME <=0.12 SENSITIVE Sensitive     CEFTAZIDIME <=1 SENSITIVE Sensitive     CEFTRIAXONE <=0.25 SENSITIVE Sensitive     CIPROFLOXACIN <=0.25 SENSITIVE Sensitive     GENTAMICIN <=1 SENSITIVE Sensitive     IMIPENEM <=0.25 SENSITIVE Sensitive     TRIMETH/SULFA <=20 SENSITIVE Sensitive      PIP/TAZO <=4 SENSITIVE Sensitive     * RARE PANTOEA AGGLOMERANS  Culture, blood (Routine X 2) w Reflex to ID Panel     Status: None (Preliminary result)   Collection Time: 04/01/22  9:26 PM   Specimen: BLOOD  Result Value Ref Range Status   Specimen Description BLOOD SITE NOT SPECIFIED  Final   Special Requests   Final    BOTTLES DRAWN AEROBIC AND ANAEROBIC Blood Culture results may not be optimal due to an inadequate volume of blood received in culture bottles   Culture   Final    NO GROWTH 4 DAYS Performed at [facility], [place] [address] [place], [place] [address]    Report Status PENDING  Incomplete    Today   Subjective    Edward [name] today has no headache,no chest abdominal pain,no new weakness tingling or numbness, feels much better wants to go home today.    Objective   Blood pressure (!) 110/54, pulse 78, temperature 98.4 F (36.9 C), temperature source Oral, resp.  rate 17, height 6' 1"  (1.854 m), weight 100 kg, SpO2 98 %.   Intake/Output Summary (Last 24 hours) at 04/05/2022 0929 Last data filed at 04/05/2022 0000 Gross per 24 hour  Intake 1120 ml  Output 3500 ml  Net -2380 ml    Exam  Awake Alert, No new F.N deficits,    [place].AT,PERRAL Supple Neck,   Symmetrical Chest wall movement, Good air movement bilaterally, CTAB RRR,No Gallops,   +ve B.Sounds, Abd Soft, Non tender,  right BKA, left transmetatarsal amputation, right hand under bandage with 2 digits, left hand has 3 digits, left arm AV fistula   Data Review   Recent Labs  Lab 04/01/22 2126 04/02/22 0205 04/03/22 0146 04/04/22 0106 04/05/22 0129  WBC 6.4 5.4 6.0 5.4 6.8  HGB 9.9* 9.0* 9.8* 10.3* 10.1*  HCT 29.4* 27.1* 29.7* 31.8* 31.1*  PLT 191 193 218 235 222  MCV 91.9 93.1 93.7 95.2 95.1  MCH 30.9 30.9 30.9 30.8 30.9  MCHC 33.7 33.2 33.0 32.4 32.5  RDW 16.0* 16.1* 16.1* 15.9* 15.9*  LYMPHSABS  --  0.7 0.8 0.8 0.6*  MONOABS  --  0.4 0.5 0.5 0.7  EOSABS  --  0.2 0.1 0.3 0.3   BASOSABS  --  0.0 0.0 0.0 0.0    Recent Labs  Lab 04/01/22 1254 04/01/22 2126 04/02/22 0205 04/03/22 0146 04/04/22 0106 04/05/22 0129  NA 134*  --  134* 135 135 136  K 4.4  --  4.5 4.3 4.2 4.2  CL 97*  --  92* 93* 92* 93*  CO2  --   --  20* 24 23 26   GLUCOSE 61*  --  106* 80 105* 97  BUN 77*  --  77* 45* 63* 38*  CREATININE 12.30* 12.04* 11.87* 7.93* 9.95* 7.32*  CALCIUM  --   --  8.8* 9.3 9.2 9.1  AST  --   --  13* 15 13* 14*  ALT  --   --  10 8 6 6   ALKPHOS  --   --  82 85 82 76  BILITOT  --   --  0.8 0.6 1.0 0.9  ALBUMIN  --   --  2.5* 2.9* 3.0* 2.8*  MG  --   --  2.2 2.0 2.1 1.9    Total Time in preparing paper work, data evaluation and todays exam - 35 minutes  Lala Lund M.D on 04/05/2022 at 9:29 AM  [facility]

## 2022-04-05 NOTE — Progress Notes (Signed)
D/C order noted. Contacted [facility] and spoke to [facility] advised pt will d/c today and resume care tomorrow.   Melven Sartorius Renal Navigator [phone]

## 2022-04-05 NOTE — Progress Notes (Signed)
RN gave patient discharge instructions and he stated understanding. IV has been removed and the patient received his medication from TOC. Pt given dose of oral abx prior to DC, waiting for family to arrive with his clothes so he can get dressed up.

## 2022-04-05 NOTE — Discharge Instructions (Addendum)
Keep your right hand clean and dry at all times.  Follow-up with Dr. Fredna Dow within a 2- 3 days for dressing change.    Follow with Primary MD [name], MD in 7 days   Get CBC, CMP, 2 view Chest X ray -  checked next visit within 1 week by Primary MD   Activity: As tolerated with Full fall precautions use walker/cane & assistance as needed  Disposition Home   Diet: Renal-low carbohydrate diet, 1500 cc fluid restriction per day.  Check CBGs q. [place].  Special Instructions: If you have smoked or chewed Tobacco  in the last 2 yrs please stop smoking, stop any regular Alcohol  and or any Recreational drug use.  On your next visit with your primary care physician please Get Medicines reviewed and adjusted.  Please request your Prim.MD to go over all Hospital Tests and Procedure/Radiological results at the follow up, please get all Hospital records sent to your Prim MD by signing hospital release before you go home.  If you experience worsening of your admission symptoms, develop shortness of breath, life threatening emergency, suicidal or homicidal thoughts you must seek medical attention immediately by calling 911 or calling your MD immediately  if symptoms less severe.  You Must read complete instructions/literature along with all the possible adverse reactions/side effects for all the Medicines you take and that have been prescribed to you. Take any new Medicines after you have completely understood and accpet all the possible adverse reactions/side effects.

## 2022-04-06 ENCOUNTER — Telehealth: Payer: Self-pay | Admitting: Nephrology

## 2022-04-06 DIAGNOSIS — E1129 Type 2 diabetes mellitus with other diabetic kidney complication: Secondary | ICD-10-CM | POA: Diagnosis not present

## 2022-04-06 DIAGNOSIS — L299 Pruritus, unspecified: Secondary | ICD-10-CM | POA: Diagnosis not present

## 2022-04-06 DIAGNOSIS — Z992 Dependence on renal dialysis: Secondary | ICD-10-CM | POA: Diagnosis not present

## 2022-04-06 DIAGNOSIS — N2581 Secondary hyperparathyroidism of renal origin: Secondary | ICD-10-CM | POA: Diagnosis not present

## 2022-04-06 DIAGNOSIS — D689 Coagulation defect, unspecified: Secondary | ICD-10-CM | POA: Diagnosis not present

## 2022-04-06 DIAGNOSIS — D631 Anemia in chronic kidney disease: Secondary | ICD-10-CM | POA: Diagnosis not present

## 2022-04-06 DIAGNOSIS — N186 End stage renal disease: Secondary | ICD-10-CM | POA: Diagnosis not present

## 2022-04-06 DIAGNOSIS — R52 Pain, unspecified: Secondary | ICD-10-CM | POA: Diagnosis not present

## 2022-04-06 LAB — AEROBIC/ANAEROBIC CULTURE W GRAM STAIN (SURGICAL/DEEP WOUND)

## 2022-04-06 LAB — CULTURE, BLOOD (ROUTINE X 2): Culture: NO GROWTH

## 2022-04-06 NOTE — Telephone Encounter (Signed)
Transition of Care Contact from [place]   Date of Discharge: 04/05/22 Date of Contact: 04/06/22 Method of contact: phone Talked to patient   Patient contacted to discuss transition of care form recent hospitaliztion. Patient was admitted to [facility] from 04/01/22 to 04/05/22 with the discharge diagnosis of hand abscess with +wound culture.     Medication changes were reviewed - Advised appropriate dose Bactrim per Joelyn Oms ABX guide 5-'10mg'$ /kg.  Patient should decrease dose to 1 tab '800mg'$ -'120mg'$ TMP-SMX qd vs 2 tabs qd.   Patient will follow up with is outpatient dialysis center 04/06/22.   Other follow up needs include none identified.    Jen Mow, PA-C [facility] Pager: [phone]

## 2022-04-07 DIAGNOSIS — Z89022 Acquired absence of left finger(s): Secondary | ICD-10-CM | POA: Diagnosis not present

## 2022-04-07 DIAGNOSIS — I5042 Chronic combined systolic (congestive) and diastolic (congestive) heart failure: Secondary | ICD-10-CM | POA: Diagnosis not present

## 2022-04-07 DIAGNOSIS — Z89611 Acquired absence of right leg above knee: Secondary | ICD-10-CM | POA: Diagnosis not present

## 2022-04-07 DIAGNOSIS — F32A Depression, unspecified: Secondary | ICD-10-CM | POA: Diagnosis not present

## 2022-04-07 DIAGNOSIS — I132 Hypertensive heart and chronic kidney disease with heart failure and with stage 5 chronic kidney disease, or end stage renal disease: Secondary | ICD-10-CM | POA: Diagnosis not present

## 2022-04-07 DIAGNOSIS — E1151 Type 2 diabetes mellitus with diabetic peripheral angiopathy without gangrene: Secondary | ICD-10-CM | POA: Diagnosis not present

## 2022-04-07 DIAGNOSIS — Z96642 Presence of left artificial hip joint: Secondary | ICD-10-CM | POA: Diagnosis not present

## 2022-04-07 DIAGNOSIS — I951 Orthostatic hypotension: Secondary | ICD-10-CM | POA: Diagnosis not present

## 2022-04-07 DIAGNOSIS — I2510 Atherosclerotic heart disease of native coronary artery without angina pectoris: Secondary | ICD-10-CM | POA: Diagnosis not present

## 2022-04-07 DIAGNOSIS — E1122 Type 2 diabetes mellitus with diabetic chronic kidney disease: Secondary | ICD-10-CM | POA: Diagnosis not present

## 2022-04-07 DIAGNOSIS — Z794 Long term (current) use of insulin: Secondary | ICD-10-CM | POA: Diagnosis not present

## 2022-04-07 DIAGNOSIS — Z993 Dependence on wheelchair: Secondary | ICD-10-CM | POA: Diagnosis not present

## 2022-04-07 DIAGNOSIS — Z992 Dependence on renal dialysis: Secondary | ICD-10-CM | POA: Diagnosis not present

## 2022-04-07 DIAGNOSIS — Z89432 Acquired absence of left foot: Secondary | ICD-10-CM | POA: Diagnosis not present

## 2022-04-07 DIAGNOSIS — N186 End stage renal disease: Secondary | ICD-10-CM | POA: Diagnosis not present

## 2022-04-07 DIAGNOSIS — Z955 Presence of coronary angioplasty implant and graft: Secondary | ICD-10-CM | POA: Diagnosis not present

## 2022-04-07 DIAGNOSIS — Z7982 Long term (current) use of aspirin: Secondary | ICD-10-CM | POA: Diagnosis not present

## 2022-04-07 DIAGNOSIS — D631 Anemia in chronic kidney disease: Secondary | ICD-10-CM | POA: Diagnosis not present

## 2022-04-07 DIAGNOSIS — I714 Abdominal aortic aneurysm, without rupture, unspecified: Secondary | ICD-10-CM | POA: Diagnosis not present

## 2022-04-07 DIAGNOSIS — Z7902 Long term (current) use of antithrombotics/antiplatelets: Secondary | ICD-10-CM | POA: Diagnosis not present

## 2022-04-07 DIAGNOSIS — Z89021 Acquired absence of right finger(s): Secondary | ICD-10-CM | POA: Diagnosis not present

## 2022-04-07 DIAGNOSIS — Z4789 Encounter for other orthopedic aftercare: Secondary | ICD-10-CM | POA: Diagnosis not present

## 2022-04-07 DIAGNOSIS — E785 Hyperlipidemia, unspecified: Secondary | ICD-10-CM | POA: Diagnosis not present

## 2022-04-07 DIAGNOSIS — K219 Gastro-esophageal reflux disease without esophagitis: Secondary | ICD-10-CM | POA: Diagnosis not present

## 2022-04-08 DIAGNOSIS — M25641 Stiffness of right hand, not elsewhere classified: Secondary | ICD-10-CM | POA: Diagnosis not present

## 2022-04-08 DIAGNOSIS — I998 Other disorder of circulatory system: Secondary | ICD-10-CM | POA: Diagnosis not present

## 2022-04-08 DIAGNOSIS — I96 Gangrene, not elsewhere classified: Secondary | ICD-10-CM | POA: Diagnosis not present

## 2022-04-08 DIAGNOSIS — R52 Pain, unspecified: Secondary | ICD-10-CM | POA: Diagnosis not present

## 2022-04-08 DIAGNOSIS — L02519 Cutaneous abscess of unspecified hand: Secondary | ICD-10-CM | POA: Diagnosis not present

## 2022-04-09 DIAGNOSIS — N186 End stage renal disease: Secondary | ICD-10-CM | POA: Diagnosis not present

## 2022-04-09 DIAGNOSIS — Z992 Dependence on renal dialysis: Secondary | ICD-10-CM | POA: Diagnosis not present

## 2022-04-09 DIAGNOSIS — N2581 Secondary hyperparathyroidism of renal origin: Secondary | ICD-10-CM | POA: Diagnosis not present

## 2022-04-09 DIAGNOSIS — R52 Pain, unspecified: Secondary | ICD-10-CM | POA: Diagnosis not present

## 2022-04-09 DIAGNOSIS — D631 Anemia in chronic kidney disease: Secondary | ICD-10-CM | POA: Diagnosis not present

## 2022-04-09 DIAGNOSIS — E1129 Type 2 diabetes mellitus with other diabetic kidney complication: Secondary | ICD-10-CM | POA: Diagnosis not present

## 2022-04-09 DIAGNOSIS — D689 Coagulation defect, unspecified: Secondary | ICD-10-CM | POA: Diagnosis not present

## 2022-04-09 DIAGNOSIS — L299 Pruritus, unspecified: Secondary | ICD-10-CM | POA: Diagnosis not present

## 2022-04-11 DIAGNOSIS — D631 Anemia in chronic kidney disease: Secondary | ICD-10-CM | POA: Diagnosis not present

## 2022-04-11 DIAGNOSIS — N186 End stage renal disease: Secondary | ICD-10-CM | POA: Diagnosis not present

## 2022-04-11 DIAGNOSIS — R52 Pain, unspecified: Secondary | ICD-10-CM | POA: Diagnosis not present

## 2022-04-11 DIAGNOSIS — L299 Pruritus, unspecified: Secondary | ICD-10-CM | POA: Diagnosis not present

## 2022-04-11 DIAGNOSIS — D689 Coagulation defect, unspecified: Secondary | ICD-10-CM | POA: Diagnosis not present

## 2022-04-11 DIAGNOSIS — N2581 Secondary hyperparathyroidism of renal origin: Secondary | ICD-10-CM | POA: Diagnosis not present

## 2022-04-11 DIAGNOSIS — Z992 Dependence on renal dialysis: Secondary | ICD-10-CM | POA: Diagnosis not present

## 2022-04-11 DIAGNOSIS — E1129 Type 2 diabetes mellitus with other diabetic kidney complication: Secondary | ICD-10-CM | POA: Diagnosis not present

## 2022-04-12 DIAGNOSIS — R52 Pain, unspecified: Secondary | ICD-10-CM | POA: Diagnosis not present

## 2022-04-12 DIAGNOSIS — T148XXA Other injury of unspecified body region, initial encounter: Secondary | ICD-10-CM | POA: Diagnosis not present

## 2022-04-12 DIAGNOSIS — I998 Other disorder of circulatory system: Secondary | ICD-10-CM | POA: Diagnosis not present

## 2022-04-12 DIAGNOSIS — I96 Gangrene, not elsewhere classified: Secondary | ICD-10-CM | POA: Diagnosis not present

## 2022-04-13 DIAGNOSIS — Z992 Dependence on renal dialysis: Secondary | ICD-10-CM | POA: Diagnosis not present

## 2022-04-13 DIAGNOSIS — D689 Coagulation defect, unspecified: Secondary | ICD-10-CM | POA: Diagnosis not present

## 2022-04-13 DIAGNOSIS — N2581 Secondary hyperparathyroidism of renal origin: Secondary | ICD-10-CM | POA: Diagnosis not present

## 2022-04-13 DIAGNOSIS — N186 End stage renal disease: Secondary | ICD-10-CM | POA: Diagnosis not present

## 2022-04-13 DIAGNOSIS — E1129 Type 2 diabetes mellitus with other diabetic kidney complication: Secondary | ICD-10-CM | POA: Diagnosis not present

## 2022-04-13 DIAGNOSIS — R52 Pain, unspecified: Secondary | ICD-10-CM | POA: Diagnosis not present

## 2022-04-13 DIAGNOSIS — L299 Pruritus, unspecified: Secondary | ICD-10-CM | POA: Diagnosis not present

## 2022-04-13 DIAGNOSIS — D631 Anemia in chronic kidney disease: Secondary | ICD-10-CM | POA: Diagnosis not present

## 2022-04-15 DIAGNOSIS — R52 Pain, unspecified: Secondary | ICD-10-CM | POA: Diagnosis not present

## 2022-04-15 DIAGNOSIS — I96 Gangrene, not elsewhere classified: Secondary | ICD-10-CM | POA: Diagnosis not present

## 2022-04-15 DIAGNOSIS — L02519 Cutaneous abscess of unspecified hand: Secondary | ICD-10-CM | POA: Diagnosis not present

## 2022-04-15 DIAGNOSIS — T148XXA Other injury of unspecified body region, initial encounter: Secondary | ICD-10-CM | POA: Diagnosis not present

## 2022-04-15 NOTE — Progress Notes (Deleted)
HPI: Follow-up coronary artery disease.  Patient admitted December 2019 with CHF and non-ST elevation myocardial infarction.  Cardiac catheterization revealed an 85% circumflex, 90% mid to distal LAD (distal LAD small vessel and not amenable to PCI) and 75% first marginal.  Patient had PCI of his left circumflex with drug-eluting stent. Stress echocardiogram February 2022 normal.  Carotid Dopplers February 2022 near normal bilaterally.  Patient had atherectomy of left popliteal, left tibioperoneal and left peroneal arteries as well as drug coated balloon angioplasty of left popliteal artery and plain balloon angioplasty of left tibioperoneal trunk and left peroneal artery August 2022. Echocardiogram at [facility] November 2022 showed ejection fraction 40 to 45%, mild RV dysfunction, mild aortic insufficiency, severe mitral regurgitation, severe tricuspid regurgitation. Monitor May 2023 showed sinus rhythm with occasional PAC, brief PAT, 5 beats nonsustained ventricular tachycardia and occasional Mobitz 1 second-degree AV block.  Since last seen,   Current Outpatient Medications  Medication Sig Dispense Refill   acetaminophen (TYLENOL) 500 MG tablet Take 500-1,000 mg by mouth every 6 (six) hours as needed for moderate pain or headache.     allopurinol (ZYLOPRIM) 100 MG tablet Take 100 mg by mouth daily.     aspirin 81 MG chewable tablet Chew 81 mg by mouth daily.     atorvastatin (LIPITOR) 80 MG tablet Take 80 mg by mouth in the morning.     bismuth subsalicylate (PEPTO BISMOL) 262 MG/15ML suspension Take 30 mLs by mouth every 6 (six) hours as needed for indigestion.     blood glucose meter kit and supplies Dispense based on patient and insurance preference. Use up to four times daily as directed. (FOR ICD-9 250.00, 250.01). 1 each 0   clopidogrel (PLAVIX) 75 MG tablet TAKE ONE TABLET BY MOUTH ONCE DAILY 30 tablet 11   ezetimibe (ZETIA) 10 MG tablet Take 1 tablet (10 mg total) by mouth daily.  30 tablet 3   gabapentin (NEURONTIN) 100 MG capsule Take 100 mg by mouth 3 (three) times daily.     insulin aspart protamine - aspart (NOVOLOG MIX 70/30 FLEXPEN) (70-30) 100 UNIT/ML FlexPen Inject 0.3 mLs (30 Units total) into the skin 2 (two) times daily with a meal. (Patient taking differently: Inject 30 Units into the skin 2 (two) times daily with a meal.) 15 mL 0   Insulin Pen Needle 31G X 5 MM MISC Use daily to inject insulin as instructed 100 each 2   linaclotide (LINZESS) 72 MCG capsule Take 1 capsule (72 mcg total) by mouth daily before breakfast. (Patient taking differently: Take 72 mcg by mouth in the morning.) 8 capsule 0   pantoprazole (PROTONIX) 40 MG tablet TAKE ONE TABLET BY MOUTH EVERY MORNING and TAKE ONE TABLET BY MOUTH EVERYDAY AT BEDTIME (Patient taking differently: Take 40 mg by mouth daily.) 180 tablet 1   traMADol (ULTRAM) 50 MG tablet Take 50 mg by mouth every 6 (six) hours as needed for pain.     triamcinolone ointment (KENALOG) 0.1 % 1 application. daily as needed (rash).     VELPHORO 500 MG chewable tablet Chew 1,000 mg by mouth 3 (three) times daily with meals.     No current facility-administered medications for this visit.     Past Medical History:  Diagnosis Date   Allergy    Anemia    getting Venofer with dialysis   Anxiety    self reported, sometimes-situational anxiety   Arthritis    bilateral hands/LEFT hip   CHF (congestive heart  failure) ([place])    Chronic kidney disease    t th s dialysis- [place] ---ESRD   Coronary artery disease    Depression    self reported, sometimes-situational depression   Diabetes mellitus    type II- on meds   Dyspnea    sometimes- fluid   GERD (gastroesophageal reflux disease)    on meds   History of blood transfusion    Hypertension    no longer on medication since starting on Hemodialysis   Myocardial infarction [facility]) 2019   Peripheral vascular disease ([place])    Sleep apnea     Past Surgical History:  Procedure  Laterality Date   A/V FISTULAGRAM Left 03/06/2020   Procedure: A/V FISTULAGRAM;  Surgeon: Waynetta Sandy, MD;  Location: [place] [facility];  Service: Cardiovascular;  Laterality: Left;   ABDOMINAL AORTOGRAM N/A 01/07/2022   Procedure: ABDOMINAL AORTOGRAM;  Surgeon: Waynetta Sandy, MD;  Location: [place] [facility];  Service: Cardiovascular;  Laterality: N/A;   ABDOMINAL AORTOGRAM W/LOWER EXTREMITY N/A 04/09/2021   Procedure: ABDOMINAL AORTOGRAM W/LOWER EXTREMITY;  Surgeon: Waynetta Sandy, MD;  Location: [place] [facility];  Service: Cardiovascular;  Laterality: N/A;   AMPUTATION Left 05/10/2016   Procedure: Left Transmetatarsal Amputation;  Surgeon: [name], MD;  Location: [place];  Service: Orthopedics;  Laterality: Left;   AMPUTATION Right 05/16/2018   Procedure: PARTIAL RAY AMPUTATION FIFTH TOE RIGHT FOOT;  Surgeon: [name], DPM;  Location: [place];  Service: Podiatry;  Laterality: Right;   AMPUTATION Right 06/17/2018   Procedure: AMPUTATION 4th RAY Right Foot;  Surgeon: Trula Slade, DPM;  Location: [place];  Service: Podiatry;  Laterality: Right;   AMPUTATION Right 06/19/2018   Procedure: RIGHT BELOW KNEE AMPUTATION;  Surgeon: [name], MD;  Location: [place];  Service: Orthopedics;  Laterality: Right;   AMPUTATION Left 10/05/2021   Procedure: LEFT RING FINGER AMPUTATION;  Surgeon: Daryll Brod, MD;  Location: [place];  Service: Orthopedics;  Laterality: Left;  45 MIN   AMPUTATION Left 11/15/2021   Procedure: LEFT INDEX FINGER AMPUTATION;  Surgeon: Leanora Cover, MD;  Location: [place];  Service: Orthopedics;  Laterality: Left;  60 MIN   AMPUTATION Right 01/08/2022   Procedure: AMPUTATION ABOVE KNEE;  Surgeon: Waynetta Sandy, MD;  Location: [place];  Service: Vascular;  Laterality: Right;   AMPUTATION Bilateral 01/12/2022   Procedure: LEFT INDEX FINGER AMPUTATION, RIGHT INDEX, LONG AND SMALL DIGITS AMPUTATION;  Surgeon: Leanora Cover, MD;   Location: [place];  Service: Orthopedics;  Laterality: Bilateral;   AMPUTATION Right 04/01/2022   Procedure: RIGHT INDEX FINGER AMPUTATION AT METACARPAL PHALANGEAL JOINT, INCISION AND DRAINAGE RIGHT HAND;  Surgeon: Leanora Cover, MD;  Location: [place];  Service: Orthopedics;  Laterality: Right;  90 MIN   AORTIC ARCH ANGIOGRAPHY N/A 01/07/2022   Procedure: AORTIC ARCH ANGIOGRAPHY;  Surgeon: Waynetta Sandy, MD;  Location: [place] [facility];  Service: Cardiovascular;  Laterality: N/A;   APPLICATION OF WOUND VAC Right 06/19/2018   Procedure: APPLICATION OF WOUND VAC;  Surgeon: [name], MD;  Location: [place];  Service: Orthopedics;  Laterality: Right;   AV FISTULA PLACEMENT Left 11/25/2019   Procedure: LEFT ARM Brachiocephalic  ARTERIOVENOUS (AV) FISTULA CREATION;  Surgeon: Rosetta Posner, MD;  Location: [place];  Service: Vascular;  Laterality: Left;   [place] Left 07/19/2020   Procedure: LEFT SECOND STAGE [place];  Surgeon: Waynetta Sandy, MD;  Location:  [place] [facility];  Service: Vascular;  Laterality: Left;   CARDIAC CATHETERIZATION  07/29/2018   CIRCUMCISION  09/02/2011   Procedure: CIRCUMCISION ADULT;  Surgeon: Molli Hazard, MD;  Location: [place];  Service: Urology;  Laterality: N/A;   CORONARY STENT INTERVENTION N/A 07/29/2018   Procedure: CORONARY STENT INTERVENTION;  Surgeon: Leonie Man, MD;  Location: [place] [facility];  Service: Cardiovascular;  Laterality: N/A;   DEBRIDEMENT AND CLOSURE WOUND Left 11/15/2021   Procedure: LEFT HAND WOUND CLOSURE;  Surgeon: Leanora Cover, MD;  Location: [place];  Service: Orthopedics;  Laterality: Left;   I & D EXTREMITY  09/02/2011   Procedure: IRRIGATION AND DEBRIDEMENT EXTREMITY;  Surgeon: Theodoro Kos, DO;  Location: [place];  Service: Plastics;  Laterality: N/A;   INCISION AND DRAINAGE OF WOUND  08/12/2011   Procedure: IRRIGATION AND DEBRIDEMENT WOUND;   Surgeon: Zenovia Jarred, MD;  Location: [place];  Service: General;;  perineum   INSERTION OF DIALYSIS CATHETER N/A 11/25/2019   Procedure: INSERTION OF [place] Internal Jugular DIALYSIS CATHETER;  Surgeon: Rosetta Posner, MD;  Location: [place];  Service: Vascular;  Laterality: N/A;   [place]  08/12/2011   Procedure: IRRIGATION AND DEBRIDEMENT ABSCESS;  Surgeon: Molli Hazard, MD;  Location: [place];  Service: Urology;  Laterality: N/A;  irrigation and debridment perineum     IRRIGATION AND DEBRIDEMENT ABSCESS  08/14/2011   Procedure: MINOR INCISION AND DRAINAGE OF ABSCESS;  Surgeon: Molli Hazard, MD;  Location: [place];  Service: Urology;  Laterality: N/A;  Perineal Wound Debridement;Placement Bilateral Testicular Thigh Pouches   LEFT HEART CATH AND CORONARY ANGIOGRAPHY N/A 07/29/2018   Procedure: LEFT HEART CATH AND CORONARY ANGIOGRAPHY;  Surgeon: Leonie Man, MD;  Location: [place] [facility];  Service: Cardiovascular;  Laterality: N/A;   LOWER EXTREMITY ANGIOGRAPHY Bilateral 01/07/2022   Procedure: Lower Extremity Angiography;  Surgeon: Waynetta Sandy, MD;  Location: [place] [facility];  Service: Cardiovascular;  Laterality: Bilateral;   PERIPHERAL VASCULAR ATHERECTOMY Left 04/09/2021   Procedure: PERIPHERAL VASCULAR ATHERECTOMY;  Surgeon: Waynetta Sandy, MD;  Location: [place] [facility];  Service: Cardiovascular;  Laterality: Left;  TP trunk and peroneal arteries   PERIPHERAL VASCULAR BALLOON ANGIOPLASTY Left 04/09/2021   Procedure: PERIPHERAL VASCULAR BALLOON ANGIOPLASTY;  Surgeon: Waynetta Sandy, MD;  Location: [place] [facility];  Service: Cardiovascular;  Laterality: Left;  [place]   REVISON OF ARTERIOVENOUS FISTULA Left 05/24/2020   Procedure: LEFT ARM ARTERIOVENOUS FISTULA  CONVERSION TO BASILIC VEIN FISTULA;  Surgeon: Waynetta Sandy, MD;  Location: [place];  Service: Vascular;  Laterality: Left;   TOTAL HIP  ARTHROPLASTY Left 10/11/2016   Procedure: LEFT TOTAL HIP ARTHROPLASTY ANTERIOR APPROACH;  Surgeon: Mcarthur Rossetti, MD;  Location: [facility];  Service: Orthopedics;  Laterality: Left;   UPPER EXTREMITY ANGIOGRAPHY Bilateral 01/07/2022   Procedure: Upper Extremity Angiography;  Surgeon: Waynetta Sandy, MD;  Location: [place] [facility];  Service: Cardiovascular;  Laterality: Bilateral;    Social History   Socioeconomic History   Marital status: Widowed    Spouse name: Not on file   Number of children: Not on file   Years of education: Not on file   Highest education level: Not on file  Occupational History   Occupation: Disabled  Tobacco Use   Smoking status: Former    Packs/day: 0.25    Years: 24.00    Total pack years: 6.00  Types: Cigarettes    Quit date: 11/19/2019    Years since quitting: 2.4   Smokeless tobacco: Never  Vaping Use   Vaping Use: Never used  Substance and Sexual Activity   Alcohol use: Not Currently    Alcohol/week: 0.0 standard drinks of alcohol    Comment: quit 2018   Drug use: No   Sexual activity: Not Currently    Birth control/protection: Condom  Other Topics Concern   Not on file  Social History Narrative   Lives w/ his 2 sons.   Social Determinants of Health   Financial Resource Strain: Not on file  Food Insecurity: Not on file  Transportation Needs: Not on file  Physical Activity: Not on file  Stress: Not on file  Social Connections: Not on file  Intimate Partner Violence: Not on file    Family History  Problem Relation Age of Onset   Diabetes Other    Pancreatic cancer Mother 75   Colon cancer Neg Hx    Colon polyps Neg Hx    Esophageal cancer Neg Hx    Rectal cancer Neg Hx    Stomach cancer Neg Hx     ROS: no fevers or chills, productive cough, hemoptysis, dysphasia, odynophagia, melena, hematochezia, dysuria, hematuria, rash, seizure activity, orthopnea, PND, pedal edema, claudication. Remaining systems are  negative.  Physical Exam: Well-developed well-nourished in no acute distress.  Skin is warm and dry.  HEENT is normal.  Neck is supple.  Chest is clear to auscultation with normal expansion.  Cardiovascular exam is regular rate and rhythm.  Abdominal exam nontender or distended. No masses palpated. Extremities show no edema. neuro grossly intact  ECG- personally reviewed  A/P  1 coronary artery disease-patient will continue with medical therapy including aspirin and statin.  2 hyperlipidemia-patient previously did not tolerate Crestor.  3 chronic combined systolic/diastolic congestive heart failure-volume is managed with dialysis.  4 history of cardiomyopathy-patient has difficulties with hypotension on dialysis days.  We have therefore avoided guideline directed medical therapy.  5 chest pain-  6 valvular heart disease-previously noted to have severe mitral and tricuspid regurgitation on echocardiogram at [facility].  Will repeat.  7 end-stage renal disease-managed by nephrology.  8 history of Mobitz 1 second-degree AV block-patient denies syncope.  Continue to avoid AV nodal blocking agents.  9 peripheral vascular disease-continue aspirin and statin.  Kirk Ruths, MD

## 2022-04-16 DIAGNOSIS — E1129 Type 2 diabetes mellitus with other diabetic kidney complication: Secondary | ICD-10-CM | POA: Diagnosis not present

## 2022-04-16 DIAGNOSIS — N186 End stage renal disease: Secondary | ICD-10-CM | POA: Diagnosis not present

## 2022-04-16 DIAGNOSIS — D689 Coagulation defect, unspecified: Secondary | ICD-10-CM | POA: Diagnosis not present

## 2022-04-16 DIAGNOSIS — R52 Pain, unspecified: Secondary | ICD-10-CM | POA: Diagnosis not present

## 2022-04-16 DIAGNOSIS — Z992 Dependence on renal dialysis: Secondary | ICD-10-CM | POA: Diagnosis not present

## 2022-04-16 DIAGNOSIS — L299 Pruritus, unspecified: Secondary | ICD-10-CM | POA: Diagnosis not present

## 2022-04-16 DIAGNOSIS — D631 Anemia in chronic kidney disease: Secondary | ICD-10-CM | POA: Diagnosis not present

## 2022-04-16 DIAGNOSIS — N2581 Secondary hyperparathyroidism of renal origin: Secondary | ICD-10-CM | POA: Diagnosis not present

## 2022-04-18 DIAGNOSIS — E1129 Type 2 diabetes mellitus with other diabetic kidney complication: Secondary | ICD-10-CM | POA: Diagnosis not present

## 2022-04-18 DIAGNOSIS — Z992 Dependence on renal dialysis: Secondary | ICD-10-CM | POA: Diagnosis not present

## 2022-04-18 DIAGNOSIS — N186 End stage renal disease: Secondary | ICD-10-CM | POA: Diagnosis not present

## 2022-04-19 DIAGNOSIS — T148XXA Other injury of unspecified body region, initial encounter: Secondary | ICD-10-CM | POA: Diagnosis not present

## 2022-04-19 DIAGNOSIS — R52 Pain, unspecified: Secondary | ICD-10-CM | POA: Diagnosis not present

## 2022-04-19 DIAGNOSIS — I96 Gangrene, not elsewhere classified: Secondary | ICD-10-CM | POA: Diagnosis not present

## 2022-04-19 DIAGNOSIS — I998 Other disorder of circulatory system: Secondary | ICD-10-CM | POA: Diagnosis not present

## 2022-04-20 DIAGNOSIS — N2581 Secondary hyperparathyroidism of renal origin: Secondary | ICD-10-CM | POA: Diagnosis not present

## 2022-04-20 DIAGNOSIS — D689 Coagulation defect, unspecified: Secondary | ICD-10-CM | POA: Diagnosis not present

## 2022-04-20 DIAGNOSIS — N186 End stage renal disease: Secondary | ICD-10-CM | POA: Diagnosis not present

## 2022-04-20 DIAGNOSIS — Z992 Dependence on renal dialysis: Secondary | ICD-10-CM | POA: Diagnosis not present

## 2022-04-20 DIAGNOSIS — R52 Pain, unspecified: Secondary | ICD-10-CM | POA: Diagnosis not present

## 2022-04-20 DIAGNOSIS — D631 Anemia in chronic kidney disease: Secondary | ICD-10-CM | POA: Diagnosis not present

## 2022-04-23 DIAGNOSIS — R52 Pain, unspecified: Secondary | ICD-10-CM | POA: Diagnosis not present

## 2022-04-23 DIAGNOSIS — T148XXA Other injury of unspecified body region, initial encounter: Secondary | ICD-10-CM | POA: Diagnosis not present

## 2022-04-23 DIAGNOSIS — N186 End stage renal disease: Secondary | ICD-10-CM | POA: Diagnosis not present

## 2022-04-23 DIAGNOSIS — Z992 Dependence on renal dialysis: Secondary | ICD-10-CM | POA: Diagnosis not present

## 2022-04-23 DIAGNOSIS — N2581 Secondary hyperparathyroidism of renal origin: Secondary | ICD-10-CM | POA: Diagnosis not present

## 2022-04-23 DIAGNOSIS — I96 Gangrene, not elsewhere classified: Secondary | ICD-10-CM | POA: Diagnosis not present

## 2022-04-23 DIAGNOSIS — D631 Anemia in chronic kidney disease: Secondary | ICD-10-CM | POA: Diagnosis not present

## 2022-04-23 DIAGNOSIS — D689 Coagulation defect, unspecified: Secondary | ICD-10-CM | POA: Diagnosis not present

## 2022-04-25 DIAGNOSIS — Z992 Dependence on renal dialysis: Secondary | ICD-10-CM | POA: Diagnosis not present

## 2022-04-25 DIAGNOSIS — N186 End stage renal disease: Secondary | ICD-10-CM | POA: Diagnosis not present

## 2022-04-25 DIAGNOSIS — R52 Pain, unspecified: Secondary | ICD-10-CM | POA: Diagnosis not present

## 2022-04-25 DIAGNOSIS — D631 Anemia in chronic kidney disease: Secondary | ICD-10-CM | POA: Diagnosis not present

## 2022-04-25 DIAGNOSIS — D689 Coagulation defect, unspecified: Secondary | ICD-10-CM | POA: Diagnosis not present

## 2022-04-25 DIAGNOSIS — N2581 Secondary hyperparathyroidism of renal origin: Secondary | ICD-10-CM | POA: Diagnosis not present

## 2022-04-26 ENCOUNTER — Ambulatory Visit: Payer: Medicare Other | Admitting: Cardiology

## 2022-04-26 DIAGNOSIS — I96 Gangrene, not elsewhere classified: Secondary | ICD-10-CM | POA: Diagnosis not present

## 2022-04-26 DIAGNOSIS — T148XXA Other injury of unspecified body region, initial encounter: Secondary | ICD-10-CM | POA: Diagnosis not present

## 2022-04-26 DIAGNOSIS — R52 Pain, unspecified: Secondary | ICD-10-CM | POA: Diagnosis not present

## 2022-04-27 DIAGNOSIS — D689 Coagulation defect, unspecified: Secondary | ICD-10-CM | POA: Diagnosis not present

## 2022-04-27 DIAGNOSIS — N186 End stage renal disease: Secondary | ICD-10-CM | POA: Diagnosis not present

## 2022-04-27 DIAGNOSIS — D631 Anemia in chronic kidney disease: Secondary | ICD-10-CM | POA: Diagnosis not present

## 2022-04-27 DIAGNOSIS — Z992 Dependence on renal dialysis: Secondary | ICD-10-CM | POA: Diagnosis not present

## 2022-04-27 DIAGNOSIS — R52 Pain, unspecified: Secondary | ICD-10-CM | POA: Diagnosis not present

## 2022-04-27 DIAGNOSIS — N2581 Secondary hyperparathyroidism of renal origin: Secondary | ICD-10-CM | POA: Diagnosis not present

## 2022-04-29 DIAGNOSIS — D631 Anemia in chronic kidney disease: Secondary | ICD-10-CM | POA: Diagnosis not present

## 2022-04-29 DIAGNOSIS — I739 Peripheral vascular disease, unspecified: Secondary | ICD-10-CM | POA: Diagnosis not present

## 2022-04-29 DIAGNOSIS — I96 Gangrene, not elsewhere classified: Secondary | ICD-10-CM | POA: Diagnosis not present

## 2022-04-29 DIAGNOSIS — I998 Other disorder of circulatory system: Secondary | ICD-10-CM | POA: Diagnosis not present

## 2022-04-29 DIAGNOSIS — T148XXS Other injury of unspecified body region, sequela: Secondary | ICD-10-CM | POA: Diagnosis not present

## 2022-04-29 DIAGNOSIS — G546 Phantom limb syndrome with pain: Secondary | ICD-10-CM | POA: Diagnosis not present

## 2022-04-29 DIAGNOSIS — L02519 Cutaneous abscess of unspecified hand: Secondary | ICD-10-CM | POA: Diagnosis not present

## 2022-04-29 DIAGNOSIS — I5042 Chronic combined systolic (congestive) and diastolic (congestive) heart failure: Secondary | ICD-10-CM | POA: Diagnosis not present

## 2022-04-29 DIAGNOSIS — L02511 Cutaneous abscess of right hand: Secondary | ICD-10-CM | POA: Diagnosis not present

## 2022-04-29 DIAGNOSIS — S68119D Complete traumatic metacarpophalangeal amputation of unspecified finger, subsequent encounter: Secondary | ICD-10-CM | POA: Diagnosis not present

## 2022-04-29 DIAGNOSIS — N186 End stage renal disease: Secondary | ICD-10-CM | POA: Diagnosis not present

## 2022-04-29 DIAGNOSIS — I110 Hypertensive heart disease with heart failure: Secondary | ICD-10-CM | POA: Diagnosis not present

## 2022-04-29 DIAGNOSIS — E1122 Type 2 diabetes mellitus with diabetic chronic kidney disease: Secondary | ICD-10-CM | POA: Diagnosis not present

## 2022-04-29 DIAGNOSIS — M79645 Pain in left finger(s): Secondary | ICD-10-CM | POA: Diagnosis not present

## 2022-04-29 DIAGNOSIS — M25641 Stiffness of right hand, not elsewhere classified: Secondary | ICD-10-CM | POA: Diagnosis not present

## 2022-04-30 DIAGNOSIS — Z992 Dependence on renal dialysis: Secondary | ICD-10-CM | POA: Diagnosis not present

## 2022-04-30 DIAGNOSIS — D631 Anemia in chronic kidney disease: Secondary | ICD-10-CM | POA: Diagnosis not present

## 2022-04-30 DIAGNOSIS — R52 Pain, unspecified: Secondary | ICD-10-CM | POA: Diagnosis not present

## 2022-04-30 DIAGNOSIS — N186 End stage renal disease: Secondary | ICD-10-CM | POA: Diagnosis not present

## 2022-04-30 DIAGNOSIS — N2581 Secondary hyperparathyroidism of renal origin: Secondary | ICD-10-CM | POA: Diagnosis not present

## 2022-04-30 DIAGNOSIS — D689 Coagulation defect, unspecified: Secondary | ICD-10-CM | POA: Diagnosis not present

## 2022-05-02 DIAGNOSIS — R52 Pain, unspecified: Secondary | ICD-10-CM | POA: Diagnosis not present

## 2022-05-02 DIAGNOSIS — N186 End stage renal disease: Secondary | ICD-10-CM | POA: Diagnosis not present

## 2022-05-02 DIAGNOSIS — D689 Coagulation defect, unspecified: Secondary | ICD-10-CM | POA: Diagnosis not present

## 2022-05-02 DIAGNOSIS — N390 Urinary tract infection, site not specified: Secondary | ICD-10-CM | POA: Diagnosis not present

## 2022-05-02 DIAGNOSIS — D631 Anemia in chronic kidney disease: Secondary | ICD-10-CM | POA: Diagnosis not present

## 2022-05-02 DIAGNOSIS — E1122 Type 2 diabetes mellitus with diabetic chronic kidney disease: Secondary | ICD-10-CM | POA: Diagnosis not present

## 2022-05-02 DIAGNOSIS — Z992 Dependence on renal dialysis: Secondary | ICD-10-CM | POA: Diagnosis not present

## 2022-05-02 DIAGNOSIS — N2581 Secondary hyperparathyroidism of renal origin: Secondary | ICD-10-CM | POA: Diagnosis not present

## 2022-05-03 DIAGNOSIS — I998 Other disorder of circulatory system: Secondary | ICD-10-CM | POA: Diagnosis not present

## 2022-05-03 DIAGNOSIS — T148XXA Other injury of unspecified body region, initial encounter: Secondary | ICD-10-CM | POA: Diagnosis not present

## 2022-05-03 DIAGNOSIS — I96 Gangrene, not elsewhere classified: Secondary | ICD-10-CM | POA: Diagnosis not present

## 2022-05-03 DIAGNOSIS — R52 Pain, unspecified: Secondary | ICD-10-CM | POA: Diagnosis not present

## 2022-05-04 DIAGNOSIS — N2581 Secondary hyperparathyroidism of renal origin: Secondary | ICD-10-CM | POA: Diagnosis not present

## 2022-05-04 DIAGNOSIS — N186 End stage renal disease: Secondary | ICD-10-CM | POA: Diagnosis not present

## 2022-05-04 DIAGNOSIS — D631 Anemia in chronic kidney disease: Secondary | ICD-10-CM | POA: Diagnosis not present

## 2022-05-04 DIAGNOSIS — Z992 Dependence on renal dialysis: Secondary | ICD-10-CM | POA: Diagnosis not present

## 2022-05-04 DIAGNOSIS — R52 Pain, unspecified: Secondary | ICD-10-CM | POA: Diagnosis not present

## 2022-05-04 DIAGNOSIS — D689 Coagulation defect, unspecified: Secondary | ICD-10-CM | POA: Diagnosis not present

## 2022-05-06 DIAGNOSIS — I998 Other disorder of circulatory system: Secondary | ICD-10-CM | POA: Diagnosis not present

## 2022-05-06 DIAGNOSIS — S68119D Complete traumatic metacarpophalangeal amputation of unspecified finger, subsequent encounter: Secondary | ICD-10-CM | POA: Diagnosis not present

## 2022-05-06 DIAGNOSIS — R52 Pain, unspecified: Secondary | ICD-10-CM | POA: Diagnosis not present

## 2022-05-06 DIAGNOSIS — M25641 Stiffness of right hand, not elsewhere classified: Secondary | ICD-10-CM | POA: Diagnosis not present

## 2022-05-06 DIAGNOSIS — T148XXA Other injury of unspecified body region, initial encounter: Secondary | ICD-10-CM | POA: Diagnosis not present

## 2022-05-06 DIAGNOSIS — I96 Gangrene, not elsewhere classified: Secondary | ICD-10-CM | POA: Diagnosis not present

## 2022-05-07 DIAGNOSIS — N186 End stage renal disease: Secondary | ICD-10-CM | POA: Diagnosis not present

## 2022-05-07 DIAGNOSIS — D689 Coagulation defect, unspecified: Secondary | ICD-10-CM | POA: Diagnosis not present

## 2022-05-07 DIAGNOSIS — D631 Anemia in chronic kidney disease: Secondary | ICD-10-CM | POA: Diagnosis not present

## 2022-05-07 DIAGNOSIS — N2581 Secondary hyperparathyroidism of renal origin: Secondary | ICD-10-CM | POA: Diagnosis not present

## 2022-05-07 DIAGNOSIS — R52 Pain, unspecified: Secondary | ICD-10-CM | POA: Diagnosis not present

## 2022-05-07 DIAGNOSIS — Z992 Dependence on renal dialysis: Secondary | ICD-10-CM | POA: Diagnosis not present

## 2022-05-08 ENCOUNTER — Ambulatory Visit: Payer: Medicare Other | Admitting: Gastroenterology

## 2022-05-10 ENCOUNTER — Observation Stay (HOSPITAL_COMMUNITY)
Admission: EM | Admit: 2022-05-10 | Discharge: 2022-05-11 | Disposition: A | Discharge status: Home / Self Care | Payer: Medicare Other | Attending: Internal Medicine | Admitting: Internal Medicine

## 2022-05-10 ENCOUNTER — Emergency Department (HOSPITAL_COMMUNITY): Payer: Medicare Other

## 2022-05-10 ENCOUNTER — Other Ambulatory Visit: Payer: Self-pay

## 2022-05-10 ENCOUNTER — Encounter (HOSPITAL_COMMUNITY): Payer: Self-pay

## 2022-05-10 DIAGNOSIS — Z794 Long term (current) use of insulin: Secondary | ICD-10-CM

## 2022-05-10 DIAGNOSIS — S29011A Strain of muscle and tendon of front wall of thorax, initial encounter: Secondary | ICD-10-CM | POA: Insufficient documentation

## 2022-05-10 DIAGNOSIS — M25511 Pain in right shoulder: Secondary | ICD-10-CM | POA: Diagnosis not present

## 2022-05-10 DIAGNOSIS — E875 Hyperkalemia: Secondary | ICD-10-CM | POA: Diagnosis present

## 2022-05-10 DIAGNOSIS — Z7902 Long term (current) use of antithrombotics/antiplatelets: Secondary | ICD-10-CM | POA: Insufficient documentation

## 2022-05-10 DIAGNOSIS — Z96642 Presence of left artificial hip joint: Secondary | ICD-10-CM | POA: Diagnosis not present

## 2022-05-10 DIAGNOSIS — E1122 Type 2 diabetes mellitus with diabetic chronic kidney disease: Secondary | ICD-10-CM

## 2022-05-10 DIAGNOSIS — Z992 Dependence on renal dialysis: Secondary | ICD-10-CM

## 2022-05-10 DIAGNOSIS — I779 Disorder of arteries and arterioles, unspecified: Secondary | ICD-10-CM | POA: Diagnosis not present

## 2022-05-10 DIAGNOSIS — I2510 Atherosclerotic heart disease of native coronary artery without angina pectoris: Secondary | ICD-10-CM | POA: Insufficient documentation

## 2022-05-10 DIAGNOSIS — R7989 Other specified abnormal findings of blood chemistry: Secondary | ICD-10-CM | POA: Diagnosis not present

## 2022-05-10 DIAGNOSIS — K7460 Unspecified cirrhosis of liver: Secondary | ICD-10-CM | POA: Diagnosis not present

## 2022-05-10 DIAGNOSIS — I959 Hypotension, unspecified: Secondary | ICD-10-CM | POA: Diagnosis not present

## 2022-05-10 DIAGNOSIS — I5030 Unspecified diastolic (congestive) heart failure: Secondary | ICD-10-CM | POA: Insufficient documentation

## 2022-05-10 DIAGNOSIS — R0902 Hypoxemia: Secondary | ICD-10-CM | POA: Diagnosis not present

## 2022-05-10 DIAGNOSIS — N186 End stage renal disease: Principal | ICD-10-CM

## 2022-05-10 DIAGNOSIS — R079 Chest pain, unspecified: Secondary | ICD-10-CM | POA: Diagnosis not present

## 2022-05-10 DIAGNOSIS — Z23 Encounter for immunization: Secondary | ICD-10-CM | POA: Diagnosis not present

## 2022-05-10 DIAGNOSIS — Z89511 Acquired absence of right leg below knee: Secondary | ICD-10-CM

## 2022-05-10 DIAGNOSIS — Z7982 Long term (current) use of aspirin: Secondary | ICD-10-CM | POA: Insufficient documentation

## 2022-05-10 DIAGNOSIS — Z79899 Other long term (current) drug therapy: Secondary | ICD-10-CM | POA: Insufficient documentation

## 2022-05-10 DIAGNOSIS — A419 Sepsis, unspecified organism: Secondary | ICD-10-CM | POA: Diagnosis not present

## 2022-05-10 DIAGNOSIS — M25519 Pain in unspecified shoulder: Secondary | ICD-10-CM | POA: Diagnosis not present

## 2022-05-10 DIAGNOSIS — R001 Bradycardia, unspecified: Secondary | ICD-10-CM | POA: Insufficient documentation

## 2022-05-10 DIAGNOSIS — I10 Essential (primary) hypertension: Secondary | ICD-10-CM | POA: Diagnosis not present

## 2022-05-10 DIAGNOSIS — E118 Type 2 diabetes mellitus with unspecified complications: Secondary | ICD-10-CM

## 2022-05-10 DIAGNOSIS — N889 Noninflammatory disorder of cervix uteri, unspecified: Secondary | ICD-10-CM | POA: Diagnosis present

## 2022-05-10 DIAGNOSIS — I132 Hypertensive heart and chronic kidney disease with heart failure and with stage 5 chronic kidney disease, or end stage renal disease: Secondary | ICD-10-CM | POA: Insufficient documentation

## 2022-05-10 DIAGNOSIS — I739 Peripheral vascular disease, unspecified: Secondary | ICD-10-CM | POA: Diagnosis present

## 2022-05-10 DIAGNOSIS — I672 Cerebral atherosclerosis: Secondary | ICD-10-CM | POA: Diagnosis not present

## 2022-05-10 LAB — CBC WITH DIFFERENTIAL/PLATELET
Abs Immature Granulocytes: 0.07 10*3/uL (ref 0.00–0.07)
Basophils Absolute: 0 10*3/uL (ref 0.0–0.1)
Basophils Relative: 0 %
Eosinophils Absolute: 0.1 10*3/uL (ref 0.0–0.5)
Eosinophils Relative: 1 %
HCT: 33.1 % — ABNORMAL LOW (ref 39.0–52.0)
Hemoglobin: 10.7 g/dL — ABNORMAL LOW (ref 13.0–17.0)
Immature Granulocytes: 1 %
Lymphocytes Relative: 9 %
Lymphs Abs: 0.6 10*3/uL — ABNORMAL LOW (ref 0.7–4.0)
MCH: 32.4 pg (ref 26.0–34.0)
MCHC: 32.3 g/dL (ref 30.0–36.0)
MCV: 100.3 fL — ABNORMAL HIGH (ref 80.0–100.0)
Monocytes Absolute: 0.6 10*3/uL (ref 0.1–1.0)
Monocytes Relative: 9 %
Neutro Abs: 5.6 10*3/uL (ref 1.7–7.7)
Neutrophils Relative %: 80 %
Platelets: 117 10*3/uL — ABNORMAL LOW (ref 150–400)
RBC: 3.3 MIL/uL — ABNORMAL LOW (ref 4.22–5.81)
RDW: 18.6 % — ABNORMAL HIGH (ref 11.5–15.5)
WBC: 7 10*3/uL (ref 4.0–10.5)
nRBC: 1.3 % — ABNORMAL HIGH (ref 0.0–0.2)

## 2022-05-10 LAB — LACTIC ACID, PLASMA
Lactic Acid, Venous: 6.4 mmol/L (ref 0.5–1.9)
Lactic Acid, Venous: 5 mmol/L (ref 0.5–1.9)
Lactic Acid, Venous: 5 mmol/L (ref 0.5–1.9)

## 2022-05-10 LAB — I-STAT CHEM 8, ED
BUN: 85 mg/dL — ABNORMAL HIGH (ref 6–20)
Calcium, Ion: 0.87 mmol/L — CL (ref 1.15–1.40)
Chloride: 105 mmol/L (ref 98–111)
Creatinine, Ser: 11.9 mg/dL — ABNORMAL HIGH (ref 0.61–1.24)
Glucose, Bld: 95 mg/dL (ref 70–99)
HCT: 35 % — ABNORMAL LOW (ref 39.0–52.0)
Hemoglobin: 11.9 g/dL — ABNORMAL LOW (ref 13.0–17.0)
Potassium: 7.2 mmol/L (ref 3.5–5.1)
Sodium: 132 mmol/L — ABNORMAL LOW (ref 135–145)
TCO2: 16 mmol/L — ABNORMAL LOW (ref 22–32)

## 2022-05-10 LAB — COMPREHENSIVE METABOLIC PANEL
ALT: 49 U/L — ABNORMAL HIGH (ref 0–44)
AST: 63 U/L — ABNORMAL HIGH (ref 15–41)
Albumin: 3.3 g/dL — ABNORMAL LOW (ref 3.5–5.0)
Alkaline Phosphatase: 162 U/L — ABNORMAL HIGH (ref 38–126)
Anion gap: 26 — ABNORMAL HIGH (ref 5–15)
BUN: 78 mg/dL — ABNORMAL HIGH (ref 6–20)
CO2: 13 mmol/L — ABNORMAL LOW (ref 22–32)
Calcium: 8.8 mg/dL — ABNORMAL LOW (ref 8.9–10.3)
Chloride: 97 mmol/L — ABNORMAL LOW (ref 98–111)
Creatinine, Ser: 10.95 mg/dL — ABNORMAL HIGH (ref 0.61–1.24)
GFR, Estimated: 5 mL/min — ABNORMAL LOW (ref >60)
Glucose, Bld: 100 mg/dL — ABNORMAL HIGH (ref 70–99)
Potassium: 7.5 mmol/L (ref 3.5–5.1)
Sodium: 136 mmol/L (ref 135–145)
Total Bilirubin: 1.3 mg/dL — ABNORMAL HIGH (ref 0.3–1.2)
Total Protein: 7.5 g/dL (ref 6.5–8.1)

## 2022-05-10 LAB — CBG MONITORING, ED: Glucose-Capillary: 75 mg/dL (ref 70–99)

## 2022-05-10 LAB — HEMOGLOBIN A1C
Hgb A1c MFr Bld: 6.3 % — ABNORMAL HIGH (ref 4.8–5.6)
Mean Plasma Glucose: 134.11 mg/dL

## 2022-05-10 LAB — PROTIME-INR
INR: 1.5 — ABNORMAL HIGH (ref 0.8–1.2)
Prothrombin Time: 17.8 seconds — ABNORMAL HIGH (ref 11.4–15.2)

## 2022-05-10 LAB — HEPATITIS B SURFACE ANTIBODY,QUALITATIVE: Hep B S Ab: REACTIVE — AB

## 2022-05-10 LAB — HEPATITIS B CORE ANTIBODY, TOTAL: Hep B Core Total Ab: NONREACTIVE

## 2022-05-10 LAB — HEPATITIS C ANTIBODY: HCV Ab: NONREACTIVE

## 2022-05-10 LAB — TROPONIN I (HIGH SENSITIVITY)
Troponin I (High Sensitivity): 50 ng/L — ABNORMAL HIGH (ref <18)
Troponin I (High Sensitivity): 47 ng/L — ABNORMAL HIGH (ref <18)

## 2022-05-10 LAB — HEPATITIS B SURFACE ANTIGEN: Hepatitis B Surface Ag: NONREACTIVE

## 2022-05-10 MED ORDER — CHLORHEXIDINE GLUCONATE CLOTH 2 % EX PADS
6.0000 | MEDICATED_PAD | Freq: Every day | CUTANEOUS | Status: DC
  Administered 2022-05-11: 6 via TOPICAL

## 2022-05-10 MED ORDER — IOHEXOL 350 MG/ML SOLN
100.0000 mL | Freq: Once | INTRAVENOUS | Status: AC | PRN
  Administered 2022-05-10: 100 mL via INTRAVENOUS

## 2022-05-10 MED ORDER — ACETAMINOPHEN 325 MG PO TABS
650.0000 mg | ORAL_TABLET | Freq: Four times a day (QID) | ORAL | Status: DC | PRN
  Administered 2022-05-11: 650 mg via ORAL
  Filled 2022-05-10: qty 2

## 2022-05-10 MED ORDER — FENTANYL CITRATE PF 50 MCG/ML IJ SOSY
50.0000 ug | PREFILLED_SYRINGE | INTRAMUSCULAR | Status: DC | PRN
  Administered 2022-05-10 – 2022-05-11 (×2): 50 ug via INTRAVENOUS
  Filled 2022-05-10 (×2): qty 1

## 2022-05-10 MED ORDER — FENTANYL CITRATE PF 50 MCG/ML IJ SOSY
50.0000 ug | PREFILLED_SYRINGE | Freq: Once | INTRAMUSCULAR | Status: AC
  Administered 2022-05-10: 50 ug via INTRAVENOUS

## 2022-05-10 MED ORDER — POLYETHYLENE GLYCOL 3350 17 G PO PACK: 17.0000 g | PACK | Freq: Every day | ORAL | Status: DC | PRN

## 2022-05-10 MED ORDER — FENTANYL CITRATE PF 50 MCG/ML IJ SOSY
PREFILLED_SYRINGE | INTRAMUSCULAR | Status: AC
  Filled 2022-05-10: qty 1

## 2022-05-10 MED ORDER — ACETAMINOPHEN 650 MG RE SUPP: 650.0000 mg | Freq: Four times a day (QID) | RECTAL | Status: DC | PRN

## 2022-05-10 MED ORDER — SODIUM BICARBONATE 8.4 % IV SOLN
50.0000 meq | Freq: Once | INTRAVENOUS | Status: AC
  Administered 2022-05-10: 50 meq via INTRAVENOUS
  Filled 2022-05-10: qty 50

## 2022-05-10 MED ORDER — CALCIUM GLUCONATE 10 % IV SOLN
1.0000 g | Freq: Once | INTRAVENOUS | Status: AC
  Administered 2022-05-10: 1 g via INTRAVENOUS
  Filled 2022-05-10: qty 10

## 2022-05-10 MED ORDER — HYDROXYZINE HCL 10 MG PO TABS
10.0000 mg | ORAL_TABLET | Freq: Once | ORAL | Status: AC | PRN
  Administered 2022-05-10: 10 mg via ORAL
  Filled 2022-05-10: qty 1

## 2022-05-10 MED ORDER — ALBUTEROL SULFATE (2.5 MG/3ML) 0.083% IN NEBU
10.0000 mg | INHALATION_SOLUTION | Freq: Once | RESPIRATORY_TRACT | Status: AC
  Administered 2022-05-10: 10 mg via RESPIRATORY_TRACT
  Filled 2022-05-10: qty 12

## 2022-05-10 MED ORDER — HYDROMORPHONE HCL 1 MG/ML IJ SOLN
1.0000 mg | Freq: Once | INTRAMUSCULAR | Status: AC
  Administered 2022-05-10: 1 mg via INTRAVENOUS
  Filled 2022-05-10: qty 1

## 2022-05-10 MED ORDER — HYDROCODONE-ACETAMINOPHEN 5-325 MG PO TABS
1.0000 | ORAL_TABLET | Freq: Three times a day (TID) | ORAL | Status: DC | PRN
  Administered 2022-05-11 (×2): 1 via ORAL
  Filled 2022-05-10 (×2): qty 1

## 2022-05-10 MED ORDER — HYDROXYZINE HCL 10 MG/5ML PO SYRP: 10.0000 mg | ORAL_SOLUTION | Freq: Once | ORAL | Status: DC | PRN

## 2022-05-10 MED ORDER — SODIUM CHLORIDE 0.9 % IV BOLUS
500.0000 mL | Freq: Once | INTRAVENOUS | Status: AC
  Administered 2022-05-10: 500 mL via INTRAVENOUS

## 2022-05-10 MED ORDER — PANTOPRAZOLE SODIUM 40 MG PO TBEC
40.0000 mg | DELAYED_RELEASE_TABLET | Freq: Every day | ORAL | Status: DC
  Administered 2022-05-11: 40 mg via ORAL
  Filled 2022-05-10: qty 1

## 2022-05-10 MED ORDER — INSULIN ASPART 100 UNIT/ML IJ SOLN: 5.0000 [IU] | Freq: Once | INTRAMUSCULAR | Status: DC

## 2022-05-10 MED ORDER — SODIUM ZIRCONIUM CYCLOSILICATE 10 G PO PACK
10.0000 g | PACK | Freq: Once | ORAL | Status: AC
  Administered 2022-05-10: 10 g via ORAL
  Filled 2022-05-10: qty 1

## 2022-05-10 MED ORDER — GABAPENTIN 100 MG PO CAPS
100.0000 mg | ORAL_CAPSULE | Freq: Three times a day (TID) | ORAL | Status: DC
  Administered 2022-05-11 (×3): 100 mg via ORAL
  Filled 2022-05-10 (×3): qty 1

## 2022-05-10 MED ORDER — HEPARIN SODIUM (PORCINE) 5000 UNIT/ML IJ SOLN
5000.0000 [IU] | Freq: Three times a day (TID) | INTRAMUSCULAR | Status: DC
  Administered 2022-05-11: 5000 [IU] via SUBCUTANEOUS
  Filled 2022-05-10: qty 1

## 2022-05-10 MED ORDER — FENTANYL CITRATE PF 50 MCG/ML IJ SOSY: 50.0000 ug | PREFILLED_SYRINGE | INTRAMUSCULAR | Status: DC | PRN

## 2022-05-10 MED ORDER — ATORVASTATIN CALCIUM 80 MG PO TABS
80.0000 mg | ORAL_TABLET | Freq: Every day | ORAL | Status: DC
  Administered 2022-05-11: 80 mg via ORAL
  Filled 2022-05-10: qty 2

## 2022-05-10 MED ORDER — ASPIRIN 81 MG PO CHEW
81.0000 mg | CHEWABLE_TABLET | Freq: Every day | ORAL | Status: DC
  Administered 2022-05-11: 81 mg via ORAL
  Filled 2022-05-10: qty 1

## 2022-05-10 MED ORDER — CLOPIDOGREL BISULFATE 75 MG PO TABS
75.0000 mg | ORAL_TABLET | Freq: Every day | ORAL | Status: DC
  Administered 2022-05-11: 75 mg via ORAL
  Filled 2022-05-10: qty 1

## 2022-05-10 MED ORDER — CAMPHOR-MENTHOL 0.5-0.5 % EX LOTN: TOPICAL_LOTION | CUTANEOUS | Status: DC | PRN

## 2022-05-10 MED ORDER — LIDOCAINE 5 % EX PTCH: 1.0000 | MEDICATED_PATCH | CUTANEOUS | Status: DC

## 2022-05-10 MED ORDER — HYDROXYZINE HCL 25 MG PO TABS
ORAL_TABLET | ORAL | Status: AC
  Administered 2022-05-10: 25 mg
  Filled 2022-05-10: qty 1

## 2022-05-10 MED ORDER — EZETIMIBE 10 MG PO TABS
10.0000 mg | ORAL_TABLET | Freq: Every day | ORAL | Status: DC
  Administered 2022-05-11: 10 mg via ORAL
  Filled 2022-05-10: qty 1

## 2022-05-10 MED ORDER — INSULIN ASPART 100 UNIT/ML IJ SOLN
0.0000 [IU] | Freq: Three times a day (TID) | INTRAMUSCULAR | Status: DC
  Administered 2022-05-11: 1 [IU] via SUBCUTANEOUS
  Administered 2022-05-11: 2 [IU] via SUBCUTANEOUS

## 2022-05-10 NOTE — ED Notes (Signed)
PA notified unable to give insulin ordered due to patients CBG 75

## 2022-05-10 NOTE — H&P (Addendum)
Date: 05/10/2022               Patient Name:  Edward [name] MRN: [id number]  DOB: 29-Sep-1970 Age / Sex: 51 y.o., male   PCP: [name], MD         Medical Service: Internal Medicine Teaching Service         Attending Physician: Dr. Evette Doffing, Mallie Mussel, *    First Contact: Dr. Elliot Gurney Pager: [phone]  Second Contact: Dr. Lisabeth Devoid Pager: [phone]       After Hours (After 5p/  First Contact Pager: [phone]  weekends / holidays): Second Contact Pager: [phone]   Chief Concern: Right shoulder pain  History of Present Illness:   Edward [name] is a 51 year old male with past medical history of ESRD on HD TTS, insulin-dependent type 2 diabetes, HDL, PAD status post left transmetatarsal amputation, right BKA, left fingers amputation, recent right hand index finger amputation, CAD, HFrEF, chronic orthostatic hypotension, who was brought to the emergency room for acute right shoulder pain this morning.  Patient reports woken up from sleep due to his right shoulder pain.  States that he has chronic right shoulder pain since his right hand surgery 4 weeks ago but not this severe.  Described pain as burning and throbbing.  He localizes the pain at the anterior upper and outer quadrant of his chest, right at the pectoris muscle.  Denies any swelling or erythema of right shoulder joint.  Said the pain gets better with shoulder immobilization.  Patient denies any trauma or heavy lifting recently.  No fever or chills.  Patient missed dialysis on Thursday due to not feeling well.  He said that he feels fatigue but otherwise had no other symptoms.  Denies fever, chills, chest pain, shortness of breath, coughing, upper respiratory infection symptoms, abdominal pain, nausea, vomiting, constipation, diarrhea.  He still makes a little amount of urine and denies dysuria, hematuria or increased frequency.  Patient has been follow-up with his hand surgeon for wound care.  He last saw him 4 days ago.  He was  told that the wound is doing well.  Because he missed the HD section on Thursday, patient has been restricting fluid and food intake.  He reportedly only intake 32 ounces of fluid yesterday. Patient report adherence to his medication.  He supposed to be on 70/30 NovoLog 2 units twice daily but has not given it recently due to hypoglycemia.  When EMS arrived, patient was found to be hypotensive and bradycardic in the 30s.  He got 1 dose of atropine and 400 cc of normal saline.  Patient received another 500 cc of bolus in the emergency room.  Vital sign improved upon my evaluation and heart rate normalizes.  Potassium was found to be critically high at 7.5 so he was given temporizing measure.  Nephrology was consulted and recommended ASAP dialysis.  Meds:  Aspirin 81 mg daily Atorvastatin 80 mg daily Plavix 75 mg daily Zetia 10 mg daily Allopurinol 100 mg daily Alprazolam 0.25 mg daily Humalog 75/25 30 units daily Hydrocodone 5-3 25 every 6 hours as needed Insulin aspart 30 units twice daily with meals Linzess daily Robaxin 500 mg as needed Tramadol 50 mg every 6 hours Gabapentin 100 mg 3 times daily Pantoprazole 40 mg daily  Allergies: Allergies as of 05/10/2022 - Review Complete 05/10/2022  Allergen Reaction Noted   Crestor [rosuvastatin] Rash 04/17/2020   Dilaudid [hydromorphone] Itching 11/15/2021   Benadryl [diphenhydramine] Rash 10/05/2021   Past  Medical History:  Diagnosis Date   Allergy    Anemia    getting Venofer with dialysis   Anxiety    self reported, sometimes-situational anxiety   Arthritis    bilateral hands/LEFT hip   CHF (congestive heart failure) (HCC)    Chronic kidney disease    t th s dialysis- [place] ---ESRD   Coronary artery disease    Depression    self reported, sometimes-situational depression   Diabetes mellitus    type II- on meds   Dyspnea    sometimes- fluid   GERD (gastroesophageal reflux disease)    on meds   History of blood transfusion     Hypertension    no longer on medication since starting on Hemodialysis   Myocardial infarction [facility]) 2019   Peripheral vascular disease ([place])    Sleep apnea     Family History:  Family History  Problem Relation Age of Onset   Diabetes Other    Pancreatic cancer Mother 64   Colon cancer Neg Hx    Colon polyps Neg Hx    Esophageal cancer Neg Hx    Rectal cancer Neg Hx    Stomach cancer Neg Hx      Social History:  -Lives with his 2 sons -Need assistance with ADLs -Denies alcohol, smoking or drug use -PCP: Dr. [name] at [facility]   Review of Systems: A complete ROS was negative except as per HPI.   Physical Exam: Blood pressure (!) 114/100, pulse 87, temperature 98.1 F (36.7 C), temperature source Oral, resp. rate 15, height '6\' 1"'$  (1.854 m), weight 94.6 kg, SpO2 98 %. Physical Exam Constitutional:      General: He is not in acute distress.    Comments: Chronically ill-appearing  HENT:     Head: Normocephalic.     Mouth/Throat:     Mouth: Mucous membranes are dry.  Eyes:     General:        Right eye: No discharge.     Conjunctiva/sclera: Conjunctivae normal.  Cardiovascular:     Rate and Rhythm: Normal rate and regular rhythm.     Heart sounds: Normal heart sounds.     Comments: +1 edema of left lower extremity Pulmonary:     Effort: Pulmonary effort is normal. No respiratory distress.     Breath sounds: Normal breath sounds. No wheezing.  Abdominal:     General: Bowel sounds are normal. There is no distension.     Palpations: Abdomen is soft.     Tenderness: There is no abdominal tenderness. There is no guarding.  Musculoskeletal:     Cervical back: Normal range of motion.     Comments: No pain to palpation at the right shoulder joint.  No effusion palpated.  No erythema or change in the overlying skin.  Pain mostly located in the right anterior upper and outer quadrant, along the pectoralis muscle.  Normal range of motion and strength of  right shoulder joint. Right index finger amputation site appears relatively clean.  The palmar surface wound is dry.  The dorsal surface has mild cloudy discharge.  No erythema or changes to the surrounding skin.  Skin:    General: Skin is warm and dry.     Coloration: Skin is not jaundiced.  Neurological:     General: No focal deficit present.     Mental Status: He is alert.  Psychiatric:        Mood and Affect: Mood normal.  Behavior: Behavior normal.         EKG: personally reviewed my interpretation is nonspecific irregular rhythm  CXR: personally reviewed my interpretation is unremarkable.  Cardiomegaly.  Assessment & Plan by Problem: Principal Problem:   Hyperkalemia Active Problems:   Diabetes mellitus with complication, with long-term current use of insulin (HCC)   S/P BKA (below knee amputation) unilateral, right (HCC)   ESRD on hemodialysis (HCC)   Elevated LFTs   PAD (peripheral artery disease) ([place])   Pectoralis muscle strain   Cervical lesion  Edward [name] is a 50 year old male with past medical history of ESRD on HD TTS, insulin-dependent type 2 diabetes, HDL, PAD status post left transmetatarsal amputation, right BKA, left fingers amputation, recent right hand index finger amputation, CAD, HFrEF, chronic orthostatic hypotension, who was brought to the emergency room for acute right shoulder pain this morning, admitted for hyperkalemic emergency from missing dialysis.  Hyperkalemic emergency ESRD Patient has received temporizing measure with Lokelma.  Patient is going to HD now.  Nephrology is managing. -Recheck BMP in a.m.  Bradycardia-resolved Possible atrial fibrillation History of sinus bradycardia and Mobitz type I AV block Suspect his bradycardia could be caused by hyperkalemia.  Patient does have history of sinus brady and Mobitz type I from EKG in 12/2021.   His EKG and telemetry are questionable for atrial fibrillation.  Per chart review,  patient was diagnosed with A-fib on EKG in 2021 and received 1 dose of apixaban but was discontinued.  His recent cardiac monitor in May did not reveal A-fib. -Repeat EKG -Telemetry overnight  Hypotension-resolved Lactic acidosis Patient have history of chronic hypotension not on midodrine.  Suspect his hypotension is from restricting p.o. intake the last few days.  No obvious signs of infection.  No fever, chills or leukocytosis.  His lactic acidosis could be from hypoperfusion and not necessarily sepsis.  His hand wound appears relatively clean. -No indication for empiric antibiotic at this time -Pending blood culture -Trending lactic acid  Right index finger ischemic necrosis s/p amputation Operated by Dr. Leanora Cover in August.  Patient is followed closely with their team, and last visit was 4 days ago. -I contacted their team who will come by to evaluate his wound inpatient.  Elevated troponin Likely demand ischemia from hypotension.  Patient has no chest pain.  Right pectoris muscle strain No involvement of the right shoulder joint.  Pain mostly localized along the pec muscle.  Suspect muscle strain.  Normal ROM and strength of right shoulder. -Pain control with Tylenol and lidocaine patch  Transaminitis Mildly elevated transaminase, could be a component of hypoperfusion.  Hepatitis B and C ab pending.  His liver has a cirrhotic appearance. -Trend LFT in a.m.  Macrocytic anemia Suspect a component of malnutrition.  Check B12  Incidental cervical lesion CTA neck showed an ill-defined groundglass lesion in the inferior trachea leading to C2 facet.  Differentials including metastasis vs myeloma vs osteodystrophy vs fibrous lesion. -Can obtain full body bone scan the next day.  Priority now is dialysis and stabilize of vital sign  Insulin dependent type 2 diabetes History of labile blood sugar. -Recheck A1c.  Maybe falsely low in the setting of ESRD -Sliding scale insulin for  now given initial CBG of 75  PAD status post multiple amputations -Continue aspirin, Plavix, statin and Zetia  Full code Renal diet DVT: Heparin IVF: N/A  Dispo: Admit patient to Observation with expected length of stay less than 2 midnights.  Signed: Gaylan Gerold, DO  05/10/2022, 2:45 PM  Pager: [phone] After 5pm on weekdays and 1pm on weekends: On Call pager: [phone]

## 2022-05-10 NOTE — ED Provider Notes (Signed)
[facility] EMERGENCY DEPARTMENT Provider Note   CSN: [id number] Arrival date & time: 05/10/22  1007     History ESRD, DM, CHF Chief Complaint  Patient presents with   Hypotension   Bradycardia    Edward [name] is a 51 y.o. male.  51 year old male with a past medical history of HF, DM, ESRD on dialysis Tuesday, Thursday, Saturday presents to the ED via EMS with a chief complaint of left shoulder pain.  Patient reports being woken up this morning with a sharp stabbing pain along the right shoulder, not exacerbated with movement.  Feeling like something "needs to pop ".  EMS arrived on the scene and noted him to be hypotensive and bradycardic with a heart rate in the 30s, he is currently on nothing for rate control.  He was given a 400 normal saline bolus with some improvement in his blood pressure with a systolic in the 23F.  He did not receive dialysis yesterday due to feeling very poorly.  He was last dialyzed on Tuesday.  Not endorsing any shortness of breath, no chest pain no fevers at home.  The history is provided by the patient and medical records.       Home Medications Prior to Admission medications   Medication Sig Start Date End Date Taking? Authorizing Provider  acetaminophen (TYLENOL) 500 MG tablet Take 500-1,000 mg by mouth every 6 (six) hours as needed for moderate pain or headache.    [provider]  allopurinol (ZYLOPRIM) 100 MG tablet Take 100 mg by mouth daily. 02/01/22   [provider]  aspirin 81 MG chewable tablet Chew 81 mg by mouth daily. 01/24/22   [provider]  atorvastatin (LIPITOR) 80 MG tablet Take 80 mg by mouth in the morning.    [provider]  bismuth subsalicylate (PEPTO BISMOL) 262 MG/15ML suspension Take 30 mLs by mouth every 6 (six) hours as needed for indigestion.    [provider]  blood glucose meter kit and supplies Dispense based on patient and insurance preference. Use up to  four times daily as directed. (FOR ICD-9 250.00, 250.01). 07/01/18   Angiulli, Lavon Paganini, PA-C  clopidogrel (PLAVIX) 75 MG tablet TAKE ONE TABLET BY MOUTH ONCE DAILY 02/01/22   Waynetta Sandy, MD  ezetimibe (ZETIA) 10 MG tablet Take 1 tablet (10 mg total) by mouth daily. 01/17/22   Danford, Suann Larry, MD  gabapentin (NEURONTIN) 100 MG capsule Take 100 mg by mouth 3 (three) times daily. 12/03/21   [provider]  insulin aspart protamine - aspart (NOVOLOG MIX 70/30 FLEXPEN) (70-30) 100 UNIT/ML FlexPen Inject 0.3 mLs (30 Units total) into the skin 2 (two) times daily with a meal. Patient taking differently: Inject 30 Units into the skin 2 (two) times daily with a meal. 09/29/20   Lavina Hamman, MD  Insulin Pen Needle 31G X 5 MM MISC Use daily to inject insulin as instructed 07/01/18   Angiulli, Lavon Paganini, PA-C  linaclotide (LINZESS) 72 MCG capsule Take 1 capsule (72 mcg total) by mouth daily before breakfast. Patient taking differently: Take 72 mcg by mouth in the morning. 12/13/20   Armbruster, Carlota Raspberry, MD  pantoprazole (PROTONIX) 40 MG tablet TAKE ONE TABLET BY MOUTH EVERY MORNING and TAKE ONE TABLET BY MOUTH EVERYDAY AT BEDTIME Patient taking differently: Take 40 mg by mouth daily. 02/01/22   Willia Craze, NP  traMADol (ULTRAM) 50 MG tablet Take 50 mg by mouth every 6 (six) hours  as needed for pain. 03/22/22   [provider]  triamcinolone ointment (KENALOG) 0.1 % 1 application. daily as needed (rash). 09/04/21   [provider]  VELPHORO 500 MG chewable tablet Chew 1,000 mg by mouth 3 (three) times daily with meals. 06/14/20   [provider]      Allergies    Crestor [rosuvastatin], Dilaudid [hydromorphone], and Benadryl [diphenhydramine]    Review of Systems   Review of Systems  Constitutional:  Negative for fever.  HENT:  Negative for sore throat.   Respiratory:  Negative for shortness of breath.   Cardiovascular:  Negative for chest pain.   Gastrointestinal:  Negative for abdominal pain, nausea and vomiting.  Genitourinary:  Negative for flank pain.  Musculoskeletal:  Positive for arthralgias.  Neurological:  Negative for headaches.  All other systems reviewed and are negative.   Physical Exam Updated Vital Signs BP 139/83 (BP Location: Right Arm)   Pulse 94   Temp 98.1 F (36.7 C) (Oral)   Resp 19   Ht 6' 1"  (1.854 m)   Wt 99.8 kg   SpO2 98%   BMI 29.03 kg/m  Physical Exam Vitals reviewed.  Constitutional:      Appearance: He is ill-appearing.  HENT:     Head: Normocephalic and atraumatic.     Mouth/Throat:     Mouth: Mucous membranes are dry.  Eyes:     Pupils: Pupils are equal, round, and reactive to light.  Cardiovascular:     Rate and Rhythm: Normal rate.  Pulmonary:     Effort: Pulmonary effort is normal.     Breath sounds: No wheezing or rales.  Abdominal:     General: Abdomen is flat.     Tenderness: There is no abdominal tenderness. There is no right CVA tenderness or left CVA tenderness.  Musculoskeletal:        General: Tenderness present.     Right shoulder: Tenderness present.     Cervical back: Normal range of motion and neck supple.  Skin:    General: Skin is warm and dry.  Neurological:     Mental Status: He is alert and oriented to person, place, and time.     ED Results / Procedures / Treatments   Labs (all labs ordered are listed, but only abnormal results are displayed) Labs Reviewed  COMPREHENSIVE METABOLIC PANEL - Abnormal; Notable for the following components:      Result Value   Potassium 7.5 (*)    Chloride 97 (*)    CO2 13 (*)    Glucose, Bld 100 (*)    BUN 78 (*)    Creatinine, Ser 10.95 (*)    Calcium 8.8 (*)    Albumin 3.3 (*)    AST 63 (*)    ALT 49 (*)    Alkaline Phosphatase 162 (*)    Total Bilirubin 1.3 (*)    GFR, Estimated 5 (*)    Anion gap 26 (*)    All other components within normal limits  LACTIC ACID, PLASMA - Abnormal; Notable for the  following components:   Lactic Acid, Venous 6.4 (*)    All other components within normal limits  CBC WITH DIFFERENTIAL/PLATELET - Abnormal; Notable for the following components:   RBC 3.30 (*)    Hemoglobin 10.7 (*)    HCT 33.1 (*)    MCV 100.3 (*)    RDW 18.6 (*)    Platelets 117 (*)    nRBC 1.3 (*)  Lymphs Abs 0.6 (*)    All other components within normal limits  PROTIME-INR - Abnormal; Notable for the following components:   Prothrombin Time 17.8 (*)    INR 1.5 (*)    All other components within normal limits  I-STAT CHEM 8, ED - Abnormal; Notable for the following components:   Sodium 132 (*)    Potassium 7.2 (*)    BUN 85 (*)    Creatinine, Ser 11.90 (*)    Calcium, Ion 0.87 (*)    TCO2 16 (*)    Hemoglobin 11.9 (*)    HCT 35.0 (*)    All other components within normal limits  TROPONIN I (HIGH SENSITIVITY) - Abnormal; Notable for the following components:   Troponin I (High Sensitivity) 50 (*)    All other components within normal limits  CULTURE, BLOOD (ROUTINE X 2)  CULTURE, BLOOD (ROUTINE X 2)  LACTIC ACID, PLASMA  URINALYSIS, ROUTINE W REFLEX MICROSCOPIC  HEPATITIS B SURFACE ANTIGEN  HEPATITIS B CORE ANTIBODY, TOTAL  HEPATITIS B SURFACE ANTIBODY,QUALITATIVE  HEPATITIS B SURFACE ANTIBODY, QUANTITATIVE  HEPATITIS C ANTIBODY  CBG MONITORING, ED  TROPONIN I (HIGH SENSITIVITY)    EKG EKG Interpretation  Date/Time:  Friday May 10 2022 10:16:54 EDT Ventricular Rate:  59 PR Interval:    QRS Duration: 129 QT Interval:  504 QTC Calculation: 500 R Axis:   -76 Text Interpretation: new  Atrial fibrillation Nonspecific IVCD with LAD Anterior infarct, old Confirmed by Blanchie Dessert [phone]) on 05/10/2022 10:35:44 AM  Radiology CT Angio Neck W and/or Wo Contrast  Result Date: 05/10/2022 CLINICAL DATA:  Vertebral artery dissection suspected. Right shoulder pains. Bradycardia. Patient missed dialysis yesterday. EXAM: CT ANGIOGRAPHY NECK TECHNIQUE:  Multidetector CT imaging of the neck was performed using the standard protocol during bolus administration of intravenous contrast. Multiplanar CT image reconstructions and MIPs were obtained to evaluate the vascular anatomy. Carotid stenosis measurements (when applicable) are obtained utilizing NASCET criteria, using the distal internal carotid diameter as the denominator. RADIATION DOSE REDUCTION: This exam was performed according to the departmental dose-optimization program which includes automated exposure control, adjustment of the mA and/or kV according to patient size and/or use of iterative reconstruction technique. CONTRAST:  144m OMNIPAQUE IOHEXOL 350 MG/ML SOLN COMPARISON:  None Available. FINDINGS: Aortic arch: Atherosclerotic calcifications are present at the aortic arch. No aneurysm or stenosis is present. Right carotid system: The right common carotid artery is within normal limits. Calcifications are present at the right carotid bifurcation. There is some irregularity through the scratched at no significant stenosis is present relative to the more distal vessel. Mild irregularity is present throughout the mid cervical right ICA without focal stenosis or dissection. Left carotid system: The left common carotid artery demonstrates some mural atherosclerotic change. Calcifications are present bifurcation without a significant stenosis. Since the calcifications are present within the external carotid branches. No focal stenosis is present in the left internal carotid artery. Vertebral arteries: The left vertebral artery is the dominant vessel. Both vertebral arteries originate from the subclavian arteries without significant stenosis. No significant focal stenosis or vessel injury is present in either vertebral artery in the neck. There is some streak artifact from right-sided hearing the level of the craniocervical junction. Mild irregularity is present within the intracranial vertebral arteries.  Vertebrobasilar junction is within normal limits. Basilar artery is normal. Both posterior cerebral arteries originate from the basilar artery. Skeleton: Somewhat ill-defined ground-glass lesion is present in the inferior trachea leading facet of C2 on the  right. There may be some involvement of the superior tracheal and facet of C3. No acute fracture is present. No other focal osseous lesions are present. Lesion extends scratched at the lesion measures up to 11 mm and ex dens beyond the confines of the facet. The lesion is separate from the vertebral artery. Other neck: Edematous changes are present in the subcutaneous soft tissues. No discrete soft tissue lesions are present. No significant adenopathy is present. Upper chest: Ground-glass attenuation is present in the lungs, left greater than right. No discrete lesions are present. No significant effusion is present. IMPRESSION: 1. No significant focal stenosis or vessel injury in the neck. 2. Somewhat ill-defined ground-glass lesion in the inferior trachea leading facet of C2 on the right with some involvement of the superior tracheal and facet of C3. The lesion is separate from the vertebral artery. This may represent a metastasis or myeloma. Brown tumor or focal renal osteodystrophy is also considered. A benign fibrous lesion could have this appearance. Recommend whole-body bone scan to look for other lesions. If this is negative, a follow-up CT without contrast in 3-6 months is recommended. 3. Atherosclerotic calcifications at the carotid bifurcations and intracranial vessels without significant stenosis. 4. Mild irregularity throughout the cervical right ICA and intracranial vertebral arteries. This may be related to atherosclerotic change. Vasculitis or FMD is considered. 5. Ground-glass attenuation in the lungs, left greater than right. This may represent atelectasis, edema, or infection. 6. Aortic Atherosclerosis (ICD10-I70.0). These results were called by  telephone at the time of interpretation on 05/10/2022 at 12:36 pm to provider [facility] , who verbally acknowledged these results. Electronically Signed   By: San Morelle M.D.   On: 05/10/2022 12:39   CT Angio Chest/Abd/Pel for Dissection W and/or Wo Contrast  Result Date: 05/10/2022 CLINICAL DATA:  Chest pain and back pain, aortic dissection suspected. EXAM: CT ANGIOGRAPHY CHEST, ABDOMEN AND PELVIS TECHNIQUE: Non-contrast CT of the chest was initially obtained. Multidetector CT imaging through the chest, abdomen and pelvis was performed using the standard protocol during bolus administration of intravenous contrast. Multiplanar reconstructed images and MIPs were obtained and reviewed to evaluate the vascular anatomy. RADIATION DOSE REDUCTION: This exam was performed according to the departmental dose-optimization program which includes automated exposure control, adjustment of the mA and/or kV according to patient size and/or use of iterative reconstruction technique. CONTRAST:  129m OMNIPAQUE IOHEXOL 350 MG/ML SOLN COMPARISON:  None Available. FINDINGS: CTA CHEST FINDINGS Cardiovascular: Preferential opacification of the thoracic aorta. No evidence of thoracic aortic aneurysm or dissection. Normal is enlarged. No pericardial effusion. Advanced atherosclerotic calcification of coronary arteries. Main pulmonary trunk is dilated measuring up to 3.3 cm concerning for pulmonary arterial hypertension. Back lumbar puncture Mediastinum/Nodes: No enlarged mediastinal, hilar, or axillary lymph nodes. Thyroid gland, trachea, and esophagus demonstrate no significant findings. Lungs/Pleura: Mosaic attenuation of the lung parenchyma. Emphysematous changes in the right middle and posterosuperior portion of the left upper lobe. No evidence of pneumonia. No pleural effusion or pneumothorax. Musculoskeletal: No chest wall abnormality. No acute or significant osseous findings. Review of the MIP images confirms the  above findings. CTA ABDOMEN AND PELVIS FINDINGS VASCULAR Aorta: Normal caliber aorta without aneurysm, dissection, vasculitis or significant stenosis. Celiac: Patent without evidence of aneurysm, dissection, vasculitis or significant stenosis. SMA: Patent without evidence of aneurysm, dissection, vasculitis or significant stenosis. Renals: Both renal arteries are patent without evidence of aneurysm, dissection, vasculitis, fibromuscular dysplasia or significant stenosis. IMA: Patent without evidence of aneurysm, dissection, vasculitis or significant  stenosis. Inflow: Patent without evidence of aneurysm, dissection, vasculitis or significant stenosis. Veins: No obvious venous abnormality within the limitations of this arterial phase study. Review of the MIP images confirms the above findings. NON-VASCULAR Hepatobiliary: Nodular contour of the liver suggesting hepatic cirrhosis. Pancreas: Unremarkable. No pancreatic ductal dilatation or surrounding inflammatory changes. Spleen: Normal in size without focal abnormality. Adrenals/Urinary Tract: Adrenal glands are unremarkable. Kidneys are normal, without renal calculi, focal lesion, or hydronephrosis. Thickening of the urinary bladder wall concerning for cystitis. Stomach/Bowel: Stomach is within normal limits. Appendix not clearly identified. No evidence of bowel wall thickening, distention, or inflammatory changes. Lymphatic: No lymphadenopathy. Reproductive: Prostate is unremarkable. Other: Mild-to-moderate dependent ascites. Mild perihepatic ascites. Musculoskeletal: Status post left hip arthroplasty. Mild degenerate disc disease of the lumbar spine. Review of the MIP images confirms the above findings. IMPRESSION: 1. No evidence of aortic dissection or acute vascular abnormality. 2. Cardiomegaly with prominent coronary artery atherosclerotic calcifications. 3. Main pulmonary trunk is dilated measuring up to 3.3 cm concerning for pulmonary arterial hypertension. 4.  Emphysematous changes of the right middle and left upper lobe with mosaic attenuation of the lung parenchyma, no evidence of pneumonia or pleural effusion. 5. Cirrhotic liver with mild-to-moderate ascites. 6. Thickening of the urinary bladder wall suggesting cystitis, correlate with urinalysis. 7. Bowel loops are normal in caliber without evidence of obstruction. 8. Mild generalized anasarca. Electronically Signed   By: Keane Police D.O.   On: 05/10/2022 12:23   DG Chest Portable 1 View  Result Date: 05/10/2022 CLINICAL DATA:  Sepsis EXAM: PORTABLE CHEST 1 VIEW COMPARISON:  01/09/2022 FINDINGS: Cardiomegaly.  Lungs clear.  No effusions or acute bony abnormality. IMPRESSION: Cardiomegaly.  No active disease. Electronically Signed   By: Rolm Baptise M.D.   On: 05/10/2022 10:44    Procedures Procedures    Medications Ordered in ED Medications  insulin aspart (novoLOG) injection 5 Units (5 Units Intravenous Not Given 05/10/22 1230)  Chlorhexidine Gluconate Cloth 2 % PADS 6 each (has no administration in time range)  fentaNYL (SUBLIMAZE) injection 50 mcg (50 mcg Intravenous Given 05/10/22 1026)  sodium chloride 0.9 % bolus 500 mL (0 mLs Intravenous Stopped 05/10/22 1211)  fentaNYL (SUBLIMAZE) injection 50 mcg (50 mcg Intravenous Given 05/10/22 1037)  sodium zirconium cyclosilicate (LOKELMA) packet 10 g (10 g Oral Given 05/10/22 1051)  albuterol (PROVENTIL) (2.5 MG/3ML) 0.083% nebulizer solution 10 mg (10 mg Nebulization Given 05/10/22 1050)  sodium bicarbonate injection 50 mEq (50 mEq Intravenous Given 05/10/22 1050)  calcium gluconate inj 10% (1 g) URGENT USE ONLY! (1 g Intravenous Given 05/10/22 1050)  HYDROmorphone (DILAUDID) injection 1 mg (1 mg Intravenous Given 05/10/22 1139)  iohexol (OMNIPAQUE) 350 MG/ML injection 100 mL (100 mLs Intravenous Contrast Given 05/10/22 1203)  HYDROmorphone (DILAUDID) injection 1 mg (1 mg Intravenous Given 05/10/22 1227)    ED Course/ Medical Decision Making/ A&P                            Medical Decision Making Amount and/or Complexity of Data Reviewed Labs: ordered. Radiology: ordered.  Risk Prescription drug management.   This patient presents to the ED for concern of bradycardia and hypotension, this involves a number of treatment options, and is a complaint that carries with it a high risk of complications and morbidity.  The differential diagnosis includes aortic dissection, ACS, right shoulder dislocation.   Co morbidities: Discussed in HPI   Brief History:  Patient here with sudden  onset of right shoulder pain upon waking up this morning, reports he was sleeping when suddenly a sharp stabbing pain began to bother him along his right shoulder.  No alleviating or exacerbating factors.  Has not been to dialysis since Tuesday.  Reports he did not go today because he felt bad.  Prior history of multiple amputations due to PVA.   EMR reviewed including pt PMHx, past surgical history and past visits to ER.   See HPI for more details   Lab Tests:  I ordered and independently interpreted labs.  The pertinent results include:    Labs notable for CBC no leukocytosis, hemoglobin at 10.7 within his baseline.  CMP remarkable for potassium of 7.5, hyperkalemia intervention started.  Creatinine level is 10.9, significant elevated from his baseline however he has not had dialysis in over the last couple of days.  Anion gap of 26.  Troponin is 50, evaded from prior.  Lactic acid is 6.4, given fluids.   Imaging Studies:  NAD. I personally reviewed all imaging studies and no acute abnormality found. I agree with radiology interpretation. No signs of fluid overload.   CT angio chest/abdomen/pelvis: 1. No evidence of aortic dissection or acute vascular abnormality.  2. Cardiomegaly with prominent coronary artery atherosclerotic  calcifications.  3. Main pulmonary trunk is dilated measuring up to 3.3 cm concerning  for pulmonary arterial hypertension.   4. Emphysematous changes of the right middle and left upper lobe  with mosaic attenuation of the lung parenchyma, no evidence of  pneumonia or pleural effusion.  5. Cirrhotic liver with mild-to-moderate ascites.  6. Thickening of the urinary bladder wall suggesting cystitis,  correlate with urinalysis.  7. Bowel loops are normal in caliber without evidence of  obstruction.  8. Mild generalized anasarca.   CT Angio neck showed: 1. No significant focal stenosis or vessel injury in the neck.  2. Somewhat ill-defined ground-glass lesion in the inferior trachea  leading facet of C2 on the right with some involvement of the  superior tracheal and facet of C3. The lesion is separate from the  vertebral artery. This may represent a metastasis or myeloma. Brown  tumor or focal renal osteodystrophy is also considered. A benign  fibrous lesion could have this appearance. Recommend whole-body bone  scan to look for other lesions. If this is negative, a follow-up CT  without contrast in 3-6 months is recommended.  3. Atherosclerotic calcifications at the carotid bifurcations and  intracranial vessels without significant stenosis.  4. Mild irregularity throughout the cervical right ICA and  intracranial vertebral arteries. This may be related to  atherosclerotic change. Vasculitis or FMD is considered.  5. Ground-glass attenuation in the lungs, left greater than right.  This may represent atelectasis, edema, or infection.  6. Aortic Atherosclerosis (ICD10-I70.0).    These results were called by telephone at the time of interpretation  on 05/10/2022 at 12:36 pm to provider [facility] , who verbally  acknowledged these results.     Cardiac Monitoring:  The patient was maintained on a cardiac monitor.  I personally viewed and interpreted the cardiac monitored which showed an underlying rhythm of: irregularly irregular EKG non-ischemic   Medicines ordered:  I ordered medication including  500 bolus  for symptomatic treatment Reevaluation of the patient after these medicines showed that the patient improved I have reviewed the patients home medicines and have made adjustments as needed   Critical Interventions:  Patient was given Lokelma, albuterol, calcium gluconate, sodium bicarb to help  with hyperkalemia with a potassium of 7.5.   Also given multiple rounds of narcotic medications such as fentanyl, Dilaudid for pain control to his right shoulder.   Consults:  I requested consultation with Dr. Melvia Heaps nephrology,  and discussed lab and imaging findings as well as pertinent plan - they recommend: given insulin 5 units to help with hyperkalemia. They will evaluate patient as soon as they can.     Reevaluation:  After the interventions noted above I re-evaluated patient and found that they have :stayed the same   Social Determinants of Health:  The patient's social determinants of health were a factor in the care of this patient    Problem List / ED Course:  Patient arrives to the ED with bradycardia, hypotension called EMS to suddenly waking up with right stabbing shoulder pain that woke him up from his sleep.  He is a dialysis patient last dialyzed on Tuesday, not attend dialysis yesterday due to feeling ill.  He arrived in the ED with a systolic in the 35A, bradycardia of 30 with EMS, given 1 mg of atropine.  He was also given a 400 mL bolus with EMS.  No signs of fluid overload on my exam.  He does appear somewhat dry oropharynx is dry.  Given additional fluids such as 500 mL bolus after reviewing chest x-ray which was clear. Labs were remarkable for hyperkalemia with a potassium of 7.4, creatinine level is at 10 which is significant elevated from his baseline however suspect likely due to missing dialysis.  Troponin is elevated from his mom.  He was given multiple rounds of Dilaudid, fentanyl to help with pain control without any improvement in his pain.  Dissection  was highly consider, CT angio neck along with CT chest abdomen and pelvis were obtained.  Does have a prior history of limb ischemia and has multiple amputations to his right and left digits. After speaking to nephrology patient will be added to dialysis list, he will also be evaluated by them.  I made multiple attempts to contact his son, however phone number on the chart is disconnected.  He remains hemodynamically stable however continues complain of right shoulder pain. Received call from radiologist due to CT angio neck showing a benign fibrous lesion versus mets.  They are recommending a whole-body scan in the future for patient.  Of his hands are without an acute finding. Unable to give insulin at this time although this was recommended by nephrology as patient's glucose is 75.   Dispostion:  After consideration of the diagnostic results and the patients response to treatment, I feel that the patent would benefit from admission.  1:10 PM Spoke to hospitalist service who will admit patient for further management.    Portions of this note were generated with [employer]. Dictation errors may occur despite best attempts at proofreading.  Final Clinical Impression(s) / ED Diagnoses Final diagnoses:  Bradycardia  Hyperkalemia    Rx / DC Orders ED Discharge Orders     None         Janeece Fitting, PA-C 05/10/22 [place], MD 05/12/22 [phone]

## 2022-05-10 NOTE — ED Notes (Signed)
Patient sent to Dialysis prior to admission orders being completed.

## 2022-05-10 NOTE — Progress Notes (Signed)
   05/10/22 1929  Vitals  Temp 98.4 F (36.9 C)  Temp Source Oral  BP 130/74  MAP (mmHg) 90  Pulse Rate 89  Resp 20  Post Treatment  Dialyzer Clearance Lightly streaked  Duration of HD Treatment -hour(s) 4 hour(s)  Hemodialysis Intake (mL) 600 mL  Liters Processed 86.7  Fluid Removed 1800 mL  Tolerated HD Treatment Yes  AVG/AVF Arterial Site Held (minutes) 10 minutes  AVG/AVF Venous Site Held (minutes) 7 minutes   TX fin. W/ Atarax given, no other difficulty

## 2022-05-10 NOTE — ED Triage Notes (Signed)
BIB EMS woke up around 0700 with right shoulder pain.  HR 30's BP 80/50.  Pain woke him up out of his sleep. Missed dialysis yesterday due to not feeling well and fatigue.  Received 400cc fluid and '1mg'$  atropine and '324mg'$ n ASA.  Denies SOB n/v CP Patient is alert and oriented x 4

## 2022-05-10 NOTE — ED Notes (Signed)
ED TO INPATIENT HANDOFF REPORT  ED Nurse Name and Phone #: [phone]   S Name/Age/Gender Edward [name] 51 y.o. male Room/Bed: TRACC/TRACC  Code Status   Code Status: Prior  Home/SNF/Other Home Patient oriented to: self, place, time, and situation Is this baseline? Yes   Triage Complete: Triage complete  Chief Complaint brady  Triage Note BIB EMS woke up around 0700 with right shoulder pain.  HR 30's BP 80/50.  Pain woke him up out of his sleep. Missed dialysis yesterday due to not feeling well and fatigue.  Received 400cc fluid and '1mg'$  atropine and '324mg'$ n ASA.  Denies SOB n/v CP Patient is alert and oriented x 4   Allergies Allergies  Allergen Reactions   Crestor [Rosuvastatin] Rash    Also had a red eye   Dilaudid [Hydromorphone] Itching   Benadryl [Diphenhydramine] Rash    Level of Care/Admitting Diagnosis ED Disposition     None       B Medical/Surgery History Past Medical History:  Diagnosis Date   Allergy    Anemia    getting Venofer with dialysis   Anxiety    self reported, sometimes-situational anxiety   Arthritis    bilateral hands/LEFT hip   CHF (congestive heart failure) (HCC)    Chronic kidney disease    t th s dialysis- [place] ---ESRD   Coronary artery disease    Depression    self reported, sometimes-situational depression   Diabetes mellitus    type II- on meds   Dyspnea    sometimes- fluid   GERD (gastroesophageal reflux disease)    on meds   History of blood transfusion    Hypertension    no longer on medication since starting on Hemodialysis   Myocardial infarction [facility]) 2019   Peripheral vascular disease ([place])    Sleep apnea    Past Surgical History:  Procedure Laterality Date   A/V FISTULAGRAM Left 03/06/2020   Procedure: A/V FISTULAGRAM;  Surgeon: Waynetta Sandy, MD;  Location: [place] [facility];  Service: Cardiovascular;  Laterality: Left;   ABDOMINAL AORTOGRAM N/A 01/07/2022   Procedure: ABDOMINAL AORTOGRAM;   Surgeon: Waynetta Sandy, MD;  Location: [place] [facility];  Service: Cardiovascular;  Laterality: N/A;   ABDOMINAL AORTOGRAM W/LOWER EXTREMITY N/A 04/09/2021   Procedure: ABDOMINAL AORTOGRAM W/LOWER EXTREMITY;  Surgeon: Waynetta Sandy, MD;  Location: [place] [facility];  Service: Cardiovascular;  Laterality: N/A;   AMPUTATION Left 05/10/2016   Procedure: Left Transmetatarsal Amputation;  Surgeon: [name], MD;  Location: [place];  Service: Orthopedics;  Laterality: Left;   AMPUTATION Right 05/16/2018   Procedure: PARTIAL RAY AMPUTATION FIFTH TOE RIGHT FOOT;  Surgeon: [name], DPM;  Location: [place];  Service: Podiatry;  Laterality: Right;   AMPUTATION Right 06/17/2018   Procedure: AMPUTATION 4th RAY Right Foot;  Surgeon: Trula Slade, DPM;  Location: [place];  Service: Podiatry;  Laterality: Right;   AMPUTATION Right 06/19/2018   Procedure: RIGHT BELOW KNEE AMPUTATION;  Surgeon: [name], MD;  Location: [place];  Service: Orthopedics;  Laterality: Right;   AMPUTATION Left 10/05/2021   Procedure: LEFT RING FINGER AMPUTATION;  Surgeon: Daryll Brod, MD;  Location: [place];  Service: Orthopedics;  Laterality: Left;  45 MIN   AMPUTATION Left 11/15/2021   Procedure: LEFT INDEX FINGER AMPUTATION;  Surgeon: Leanora Cover, MD;  Location: [place];  Service: Orthopedics;  Laterality: Left;  60 MIN   AMPUTATION Right 01/08/2022   Procedure:  AMPUTATION ABOVE KNEE;  Surgeon: Waynetta Sandy, MD;  Location: [place];  Service: Vascular;  Laterality: Right;   AMPUTATION Bilateral 01/12/2022   Procedure: LEFT INDEX FINGER AMPUTATION, RIGHT INDEX, LONG AND SMALL DIGITS AMPUTATION;  Surgeon: Leanora Cover, MD;  Location: [place];  Service: Orthopedics;  Laterality: Bilateral;   AMPUTATION Right 04/01/2022   Procedure: RIGHT INDEX FINGER AMPUTATION AT METACARPAL PHALANGEAL JOINT, INCISION AND DRAINAGE RIGHT HAND;  Surgeon: Leanora Cover, MD;  Location: [place];  Service: Orthopedics;   Laterality: Right;  90 MIN   AORTIC ARCH ANGIOGRAPHY N/A 01/07/2022   Procedure: AORTIC ARCH ANGIOGRAPHY;  Surgeon: Waynetta Sandy, MD;  Location: [place] [facility];  Service: Cardiovascular;  Laterality: N/A;   APPLICATION OF WOUND VAC Right 06/19/2018   Procedure: APPLICATION OF WOUND VAC;  Surgeon: [name], MD;  Location: [place];  Service: Orthopedics;  Laterality: Right;   AV FISTULA PLACEMENT Left 11/25/2019   Procedure: LEFT ARM Brachiocephalic  ARTERIOVENOUS (AV) FISTULA CREATION;  Surgeon: Rosetta Posner, MD;  Location: [place];  Service: Vascular;  Laterality: Left;   [place] Left 07/19/2020   Procedure: LEFT SECOND STAGE [place];  Surgeon: Waynetta Sandy, MD;  Location: [place];  Service: Vascular;  Laterality: Left;   CARDIAC CATHETERIZATION  07/29/2018   CIRCUMCISION  09/02/2011   Procedure: CIRCUMCISION ADULT;  Surgeon: Molli Hazard, MD;  Location: [place];  Service: Urology;  Laterality: N/A;   CORONARY STENT INTERVENTION N/A 07/29/2018   Procedure: CORONARY STENT INTERVENTION;  Surgeon: Leonie Man, MD;  Location: [place] [facility];  Service: Cardiovascular;  Laterality: N/A;   DEBRIDEMENT AND CLOSURE WOUND Left 11/15/2021   Procedure: LEFT HAND WOUND CLOSURE;  Surgeon: Leanora Cover, MD;  Location: [place];  Service: Orthopedics;  Laterality: Left;   I & D EXTREMITY  09/02/2011   Procedure: IRRIGATION AND DEBRIDEMENT EXTREMITY;  Surgeon: Theodoro Kos, DO;  Location: [place];  Service: Plastics;  Laterality: N/A;   INCISION AND DRAINAGE OF WOUND  08/12/2011   Procedure: IRRIGATION AND DEBRIDEMENT WOUND;  Surgeon: Zenovia Jarred, MD;  Location: [place];  Service: General;;  perineum   INSERTION OF DIALYSIS CATHETER N/A 11/25/2019   Procedure: INSERTION OF Righ Internal Jugular DIALYSIS CATHETER;  Surgeon: Rosetta Posner, MD;  Location: [place];  Service: Vascular;  Laterality: N/A;    [place]  08/12/2011   Procedure: IRRIGATION AND DEBRIDEMENT ABSCESS;  Surgeon: Molli Hazard, MD;  Location: [place];  Service: Urology;  Laterality: N/A;  irrigation and debridment perineum     IRRIGATION AND DEBRIDEMENT ABSCESS  08/14/2011   Procedure: MINOR INCISION AND DRAINAGE OF ABSCESS;  Surgeon: Molli Hazard, MD;  Location: [place];  Service: Urology;  Laterality: N/A;  Perineal Wound Debridement;Placement Bilateral Testicular Thigh Pouches   LEFT HEART CATH AND CORONARY ANGIOGRAPHY N/A 07/29/2018   Procedure: LEFT HEART CATH AND CORONARY ANGIOGRAPHY;  Surgeon: Leonie Man, MD;  Location: [place] [facility];  Service: Cardiovascular;  Laterality: N/A;   LOWER EXTREMITY ANGIOGRAPHY Bilateral 01/07/2022   Procedure: Lower Extremity Angiography;  Surgeon: Waynetta Sandy, MD;  Location: [place] [facility];  Service: Cardiovascular;  Laterality: Bilateral;   PERIPHERAL VASCULAR ATHERECTOMY Left 04/09/2021   Procedure: PERIPHERAL VASCULAR ATHERECTOMY;  Surgeon: Waynetta Sandy, MD;  Location: [place] [facility];  Service: Cardiovascular;  Laterality: Left;  TP trunk and peroneal arteries  PERIPHERAL VASCULAR BALLOON ANGIOPLASTY Left 04/09/2021   Procedure: PERIPHERAL VASCULAR BALLOON ANGIOPLASTY;  Surgeon: Waynetta Sandy, MD;  Location: [place] [facility];  Service: Cardiovascular;  Laterality: Left;  Popliteal   REVISON OF ARTERIOVENOUS FISTULA Left 05/24/2020   Procedure: LEFT ARM ARTERIOVENOUS FISTULA  CONVERSION TO BASILIC VEIN FISTULA;  Surgeon: Waynetta Sandy, MD;  Location: [place];  Service: Vascular;  Laterality: Left;   TOTAL HIP ARTHROPLASTY Left 10/11/2016   Procedure: LEFT TOTAL HIP ARTHROPLASTY ANTERIOR APPROACH;  Surgeon: Mcarthur Rossetti, MD;  Location: [facility];  Service: Orthopedics;  Laterality: Left;   UPPER EXTREMITY ANGIOGRAPHY Bilateral 01/07/2022   Procedure: Upper Extremity Angiography;   Surgeon: Waynetta Sandy, MD;  Location: [place] [facility];  Service: Cardiovascular;  Laterality: Bilateral;     A IV Location/Drains/Wounds Patient Lines/Drains/Airways Status     Active Line/Drains/Airways     Name Placement date Placement time Site Days   Peripheral IV 05/10/22 20 G Right;Posterior Wrist 05/10/22  1021  Wrist  less than 1   Peripheral IV 05/10/22 18 G Right Antecubital 05/10/22  1021  Antecubital  less than 1   Fistula / Graft Left Arteriovenous fistula 11/25/19  1250  --  897   Open Drain 2 Left Hand 11/15/21  1843  Hand  176   Incision (Closed) 07/19/20 Arm Left 07/19/20  0920  -- 660   Incision (Closed) 10/05/21 Hand 10/05/21  1828  -- 217   Incision (Closed) 11/15/21 Hand Left 11/15/21  1823  -- 176   Incision (Closed) 01/08/22 Leg Right 01/08/22  1600  -- 122   Incision (Closed) 01/12/22 Hand Left 01/12/22  1108  -- 118   Incision (Closed) 01/12/22 Hand Right 01/12/22  1221  -- 118   Incision (Closed) 04/01/22 Arm 04/01/22  1606  -- 39            Intake/Output Last 24 hours  Intake/Output Summary (Last 24 hours) at 05/10/2022 1251 Last data filed at 05/10/2022 1211 Gross per 24 hour  Intake 500 ml  Output --  Net 500 ml    Labs/Imaging Results for orders placed or performed during the hospital encounter of 05/10/22 (from the past 48 hour(s))  Comprehensive metabolic panel     Status: Abnormal   Collection Time: 05/10/22 10:23 AM  Result Value Ref Range   Sodium 136 135 - 145 mmol/L   Potassium 7.5 (HH) 3.5 - 5.1 mmol/L    Comment: CRITICAL RESULT CALLED TO, READ BACK BY AND VERIFIED WITH M.Azyria Osmon 1135 05/10/22 MCCORMICK K   Chloride 97 (L) 98 - 111 mmol/L   CO2 13 (L) 22 - 32 mmol/L   Glucose, Bld 100 (H) 70 - 99 mg/dL    Comment: Glucose reference range applies only to samples taken after fasting for at least 8 hours.   BUN 78 (H) 6 - 20 mg/dL   Creatinine, Ser 10.95 (H) 0.61 - 1.24 mg/dL   Calcium 8.8 (L) 8.9 - 10.3 mg/dL    Total Protein 7.5 6.5 - 8.1 g/dL   Albumin 3.3 (L) 3.5 - 5.0 g/dL   AST 63 (H) 15 - 41 U/L   ALT 49 (H) 0 - 44 U/L   Alkaline Phosphatase 162 (H) 38 - 126 U/L   Total Bilirubin 1.3 (H) 0.3 - 1.2 mg/dL   GFR, Estimated 5 (L) >60 mL/min    Comment: (NOTE) Calculated using the CKD-EPI Creatinine Equation (2021)    Anion gap 26 (H) 5 -  15    Comment: Performed at [facility], [place] [address], [place], [place] [address]  Lactic acid, plasma     Status: Abnormal   Collection Time: 05/10/22 10:23 AM  Result Value Ref Range   Lactic Acid, Venous 6.4 (HH) 0.5 - 1.9 mmol/L    Comment: CRITICAL RESULT CALLED TO, READ BACK BY AND VERIFIED WITH M.Riyana Biel 1135 05/10/22 MCCORMICK K Performed at [facility], [place] [address], [place], [place] [address]   CBC with Differential     Status: Abnormal   Collection Time: 05/10/22 10:23 AM  Result Value Ref Range   WBC 7.0 4.0 - 10.5 K/uL   RBC 3.30 (L) 4.22 - 5.81 MIL/uL   Hemoglobin 10.7 (L) 13.0 - 17.0 g/dL   HCT 33.1 (L) 39.0 - 52.0 %   MCV 100.3 (H) 80.0 - 100.0 fL   MCH 32.4 26.0 - 34.0 pg   MCHC 32.3 30.0 - 36.0 g/dL   RDW 18.6 (H) 11.5 - 15.5 %   Platelets 117 (L) 150 - 400 K/uL   nRBC 1.3 (H) 0.0 - 0.2 %   Neutrophils Relative % 80 %   Neutro Abs 5.6 1.7 - 7.7 K/uL   Lymphocytes Relative 9 %   Lymphs Abs 0.6 (L) 0.7 - 4.0 K/uL   Monocytes Relative 9 %   Monocytes Absolute 0.6 0.1 - 1.0 K/uL   Eosinophils Relative 1 %   Eosinophils Absolute 0.1 0.0 - 0.5 K/uL   Basophils Relative 0 %   Basophils Absolute 0.0 0.0 - 0.1 K/uL   Immature Granulocytes 1 %   Abs Immature Granulocytes 0.07 0.00 - 0.07 K/uL    Comment: Performed at [facility], [place] [address], [place], [place] [address]  Protime-INR     Status: Abnormal   Collection Time: 05/10/22 10:23 AM  Result Value Ref Range   Prothrombin Time 17.8 (H) 11.4 - 15.2 seconds   INR 1.5 (H) 0.8 - 1.2    Comment: (NOTE) INR goal varies based on device and disease  states. Performed at [facility], [place] [address] [place], [place] [address]   Troponin I (High Sensitivity)     Status: Abnormal   Collection Time: 05/10/22 10:23 AM  Result Value Ref Range   Troponin I (High Sensitivity) 50 (H) <18 ng/L    Comment: (NOTE) Elevated high sensitivity troponin I (hsTnI) values and significant  changes across serial measurements may suggest ACS but many other  chronic and acute conditions are known to elevate hsTnI results.  Refer to the "Links" section for chest pain algorithms and additional  guidance. Performed at [facility], [place] [address] [place],  [address]   I-stat chem 8, ED (not at [facility] or [facility])     Status: Abnormal   Collection Time: 05/10/22 10:31 AM  Result Value Ref Range   Sodium 132 (L) 135 - 145 mmol/L   Potassium 7.2 (HH) 3.5 - 5.1 mmol/L   Chloride 105 98 - 111 mmol/L   BUN 85 (H) 6 - 20 mg/dL   Creatinine, Ser 11.90 (H) 0.61 - 1.24 mg/dL   Glucose, Bld 95 70 - 99 mg/dL    Comment: Glucose reference range applies only to samples taken after fasting for at least 8 hours.   Calcium, Ion 0.87 (LL) 1.15 - 1.40 mmol/L   TCO2 16 (L) 22 - 32 mmol/L   Hemoglobin 11.9 (L) 13.0 - 17.0 g/dL   HCT 35.0 (  L) 39.0 - 52.0 %   Comment NOTIFIED PHYSICIAN   CBG monitoring, ED     Status: None   Collection Time: 05/10/22 12:25 PM  Result Value Ref Range   Glucose-Capillary 75 70 - 99 mg/dL    Comment: Glucose reference range applies only to samples taken after fasting for at least 8 hours.   CT Angio Neck W and/or Wo Contrast  Result Date: 05/10/2022 CLINICAL DATA:  Vertebral artery dissection suspected. Right shoulder pains. Bradycardia. Patient missed dialysis yesterday. EXAM: CT ANGIOGRAPHY NECK TECHNIQUE: Multidetector CT imaging of the neck was performed using the standard protocol during bolus administration of intravenous contrast. Multiplanar CT image reconstructions and MIPs were obtained to evaluate the vascular  anatomy. Carotid stenosis measurements (when applicable) are obtained utilizing NASCET criteria, using the distal internal carotid diameter as the denominator. RADIATION DOSE REDUCTION: This exam was performed according to the departmental dose-optimization program which includes automated exposure control, adjustment of the mA and/or kV according to patient size and/or use of iterative reconstruction technique. CONTRAST:  147m OMNIPAQUE IOHEXOL 350 MG/ML SOLN COMPARISON:  None Available. FINDINGS: Aortic arch: Atherosclerotic calcifications are present at the aortic arch. No aneurysm or stenosis is present. Right carotid system: The right common carotid artery is within normal limits. Calcifications are present at the right carotid bifurcation. There is some irregularity through the scratched at no significant stenosis is present relative to the more distal vessel. Mild irregularity is present throughout the mid cervical right ICA without focal stenosis or dissection. Left carotid system: The left common carotid artery demonstrates some mural atherosclerotic change. Calcifications are present bifurcation without a significant stenosis. Since the calcifications are present within the external carotid branches. No focal stenosis is present in the left internal carotid artery. Vertebral arteries: The left vertebral artery is the dominant vessel. Both vertebral arteries originate from the subclavian arteries without significant stenosis. No significant focal stenosis or vessel injury is present in either vertebral artery in the neck. There is some streak artifact from right-sided hearing the level of the craniocervical junction. Mild irregularity is present within the intracranial vertebral arteries. Vertebrobasilar junction is within normal limits. Basilar artery is normal. Both posterior cerebral arteries originate from the basilar artery. Skeleton: Somewhat ill-defined ground-glass lesion is present in the inferior  trachea leading facet of C2 on the right. There may be some involvement of the superior tracheal and facet of C3. No acute fracture is present. No other focal osseous lesions are present. Lesion extends scratched at the lesion measures up to 11 mm and ex dens beyond the confines of the facet. The lesion is separate from the vertebral artery. Other neck: Edematous changes are present in the subcutaneous soft tissues. No discrete soft tissue lesions are present. No significant adenopathy is present. Upper chest: Ground-glass attenuation is present in the lungs, left greater than right. No discrete lesions are present. No significant effusion is present. IMPRESSION: 1. No significant focal stenosis or vessel injury in the neck. 2. Somewhat ill-defined ground-glass lesion in the inferior trachea leading facet of C2 on the right with some involvement of the superior tracheal and facet of C3. The lesion is separate from the vertebral artery. This may represent a metastasis or myeloma. Brown tumor or focal renal osteodystrophy is also considered. A benign fibrous lesion could have this appearance. Recommend whole-body bone scan to look for other lesions. If this is negative, a follow-up CT without contrast in 3-6 months is recommended. 3. Atherosclerotic calcifications at the carotid  bifurcations and intracranial vessels without significant stenosis. 4. Mild irregularity throughout the cervical right ICA and intracranial vertebral arteries. This may be related to atherosclerotic change. Vasculitis or FMD is considered. 5. Ground-glass attenuation in the lungs, left greater than right. This may represent atelectasis, edema, or infection. 6. Aortic Atherosclerosis (ICD10-I70.0). These results were called by telephone at the time of interpretation on 05/10/2022 at 12:36 pm to provider [facility] , who verbally acknowledged these results. Electronically Signed   By: San Morelle M.D.   On: 05/10/2022 12:39   CT Angio  Chest/Abd/Pel for Dissection W and/or Wo Contrast  Result Date: 05/10/2022 CLINICAL DATA:  Chest pain and back pain, aortic dissection suspected. EXAM: CT ANGIOGRAPHY CHEST, ABDOMEN AND PELVIS TECHNIQUE: Non-contrast CT of the chest was initially obtained. Multidetector CT imaging through the chest, abdomen and pelvis was performed using the standard protocol during bolus administration of intravenous contrast. Multiplanar reconstructed images and MIPs were obtained and reviewed to evaluate the vascular anatomy. RADIATION DOSE REDUCTION: This exam was performed according to the departmental dose-optimization program which includes automated exposure control, adjustment of the mA and/or kV according to patient size and/or use of iterative reconstruction technique. CONTRAST:  151m OMNIPAQUE IOHEXOL 350 MG/ML SOLN COMPARISON:  None Available. FINDINGS: CTA CHEST FINDINGS Cardiovascular: Preferential opacification of the thoracic aorta. No evidence of thoracic aortic aneurysm or dissection. Normal is enlarged. No pericardial effusion. Advanced atherosclerotic calcification of coronary arteries. Main pulmonary trunk is dilated measuring up to 3.3 cm concerning for pulmonary arterial hypertension. Back lumbar puncture Mediastinum/Nodes: No enlarged mediastinal, hilar, or axillary lymph nodes. Thyroid gland, trachea, and esophagus demonstrate no significant findings. Lungs/Pleura: Mosaic attenuation of the lung parenchyma. Emphysematous changes in the right middle and posterosuperior portion of the left upper lobe. No evidence of pneumonia. No pleural effusion or pneumothorax. Musculoskeletal: No chest wall abnormality. No acute or significant osseous findings. Review of the MIP images confirms the above findings. CTA ABDOMEN AND PELVIS FINDINGS VASCULAR Aorta: Normal caliber aorta without aneurysm, dissection, vasculitis or significant stenosis. Celiac: Patent without evidence of aneurysm, dissection, vasculitis or  significant stenosis. SMA: Patent without evidence of aneurysm, dissection, vasculitis or significant stenosis. Renals: Both renal arteries are patent without evidence of aneurysm, dissection, vasculitis, fibromuscular dysplasia or significant stenosis. IMA: Patent without evidence of aneurysm, dissection, vasculitis or significant stenosis. Inflow: Patent without evidence of aneurysm, dissection, vasculitis or significant stenosis. Veins: No obvious venous abnormality within the limitations of this arterial phase study. Review of the MIP images confirms the above findings. NON-VASCULAR Hepatobiliary: Nodular contour of the liver suggesting hepatic cirrhosis. Pancreas: Unremarkable. No pancreatic ductal dilatation or surrounding inflammatory changes. Spleen: Normal in size without focal abnormality. Adrenals/Urinary Tract: Adrenal glands are unremarkable. Kidneys are normal, without renal calculi, focal lesion, or hydronephrosis. Thickening of the urinary bladder wall concerning for cystitis. Stomach/Bowel: Stomach is within normal limits. Appendix not clearly identified. No evidence of bowel wall thickening, distention, or inflammatory changes. Lymphatic: No lymphadenopathy. Reproductive: Prostate is unremarkable. Other: Mild-to-moderate dependent ascites. Mild perihepatic ascites. Musculoskeletal: Status post left hip arthroplasty. Mild degenerate disc disease of the lumbar spine. Review of the MIP images confirms the above findings. IMPRESSION: 1. No evidence of aortic dissection or acute vascular abnormality. 2. Cardiomegaly with prominent coronary artery atherosclerotic calcifications. 3. Main pulmonary trunk is dilated measuring up to 3.3 cm concerning for pulmonary arterial hypertension. 4. Emphysematous changes of the right middle and left upper lobe with mosaic attenuation of the lung parenchyma, no evidence of pneumonia  or pleural effusion. 5. Cirrhotic liver with mild-to-moderate ascites. 6. Thickening of  the urinary bladder wall suggesting cystitis, correlate with urinalysis. 7. Bowel loops are normal in caliber without evidence of obstruction. 8. Mild generalized anasarca. Electronically Signed   By: Keane Police D.O.   On: 05/10/2022 12:23   DG Chest Portable 1 View  Result Date: 05/10/2022 CLINICAL DATA:  Sepsis EXAM: PORTABLE CHEST 1 VIEW COMPARISON:  01/09/2022 FINDINGS: Cardiomegaly.  Lungs clear.  No effusions or acute bony abnormality. IMPRESSION: Cardiomegaly.  No active disease. Electronically Signed   By: Rolm Baptise M.D.   On: 05/10/2022 10:44    Pending Labs Unresulted Labs (From admission, onward)     Start     Ordered   05/10/22 1300  Hepatitis B core antibody, total  Once,   R        05/10/22 1300   05/10/22 1300  Hepatitis B surface antibody,qualitative  Once,   R        05/10/22 1300   05/10/22 1300  Hepatitis B surface antibody,quantitative  Once,   R        05/10/22 1300   05/10/22 1300  Hepatitis C antibody  Once,   R        05/10/22 1300   05/10/22 1226  Hepatitis B surface antigen  (New Admission Hemo Labs (Hepatitis B))  Once,   URGENT        05/10/22 1228   05/10/22 1023  Lactic acid, plasma  Now then every 2 hours,   R (with STAT occurrences)      05/10/22 1023   05/10/22 1023  Culture, blood (Routine x 2)  BLOOD CULTURE X 2,   R (with STAT occurrences)      05/10/22 1023   05/10/22 1023  Urinalysis, Routine w reflex microscopic  Once,   URGENT        05/10/22 1023            Vitals/Pain Today's Vitals   05/10/22 1212 05/10/22 1215 05/10/22 1220 05/10/22 1230  BP: 134/88 102/66  132/69  Pulse:  66  73  Resp: 15 (!) 22  15  Temp:      TempSrc:      SpO2:  100%  99%  Weight:      Height:      PainSc:   10-Worst pain ever     Isolation Precautions No active isolations  Medications Medications  insulin aspart (novoLOG) injection 5 Units (5 Units Intravenous Not Given 05/10/22 1230)  Chlorhexidine Gluconate Cloth 2 % PADS 6 each (has no  administration in time range)  fentaNYL (SUBLIMAZE) injection 50 mcg (50 mcg Intravenous Given 05/10/22 1026)  sodium chloride 0.9 % bolus 500 mL (0 mLs Intravenous Stopped 05/10/22 1211)  fentaNYL (SUBLIMAZE) injection 50 mcg (50 mcg Intravenous Given 05/10/22 1037)  sodium zirconium cyclosilicate (LOKELMA) packet 10 g (10 g Oral Given 05/10/22 1051)  albuterol (PROVENTIL) (2.5 MG/3ML) 0.083% nebulizer solution 10 mg (10 mg Nebulization Given 05/10/22 1050)  sodium bicarbonate injection 50 mEq (50 mEq Intravenous Given 05/10/22 1050)  calcium gluconate inj 10% (1 g) URGENT USE ONLY! (1 g Intravenous Given 05/10/22 1050)  HYDROmorphone (DILAUDID) injection 1 mg (1 mg Intravenous Given 05/10/22 1139)  iohexol (OMNIPAQUE) 350 MG/ML injection 100 mL (100 mLs Intravenous Contrast Given 05/10/22 1203)  HYDROmorphone (DILAUDID) injection 1 mg (1 mg Intravenous Given 05/10/22 1227)    Mobility manual wheelchair Low fall risk   Focused Assessments Renal Assessment Handoff:  Hemodialysis Schedule: Hemodialysis Schedule: Tuesday/Thursday/Saturday Last Hemodialysis date and time: missed 2-3 treatments   Restricted appendage: left arm   R Recommendations: See Admitting Provider Note  Report given to:   Additional Notes: initially hypotensive brady received atropine '1mg'$  400cc fluids.  Initial K 7.2 reversed with lokelma, calcium bicarb and albuterol, lactic 6.4 initially received 500cc fluids in ER 400cc with EMS  18gRAC 20gRW  Afib on monitor  AKA right, no toes on left and missing multiple digits to bilateral hands

## 2022-05-10 NOTE — Progress Notes (Signed)
Responded to page to provided support to pt experiencing shoulder pain. Chaplain available as needed.  Jaclynn Major, [place], [facility], Pager [phone]

## 2022-05-10 NOTE — Consult Note (Addendum)
Renal Service Consult Note [facility]  Edward [name] 05/10/2022 Sol Blazing, MD Requesting Physician: Dr. Evette Doffing  Reason for Consult: ESRD pt w/ hyperkalemia HPI: The patient is a 51 y.o. year-old w/ hx of ESRD on HD, IDDM2, PAD w/ L TMA/ R BKA and multiple finger amps, HFrEF, chronic hypotension presented to ED for R shoulder pain today. Pt missed HD Thursday due to not feeling well (fatigue mainly). Per EMS had bradycardia and low BP's and rec'd IV atropine and 400 cc NS. In ED pt rec'd bolus 500 cc NS. HR 50-60s in ED. K+ 7.5 though and we are asked to see for dialysis.   Pt seen in ED. He is in no distress, no sob or cough, no chest pain, no abd pain. Getting twice per week dressing changes at the hand surgeons office.   ROS - denies CP, no joint pain, no HA, no blurry vision, no rash, no diarrhea, no nausea/ vomiting, no dysuria, no difficulty voiding   Past Medical History  Past Medical History:  Diagnosis Date   Allergy    Anemia    getting Venofer with dialysis   Anxiety    self reported, sometimes-situational anxiety   Arthritis    bilateral hands/LEFT hip   CHF (congestive heart failure) (HCC)    Chronic kidney disease    t th s dialysis- [place] ---ESRD   Coronary artery disease    Depression    self reported, sometimes-situational depression   Diabetes mellitus    type II- on meds   Dyspnea    sometimes- fluid   GERD (gastroesophageal reflux disease)    on meds   History of blood transfusion    Hypertension    no longer on medication since starting on Hemodialysis   Myocardial infarction [facility]) 2019   Peripheral vascular disease ([place])    Sleep apnea    Past Surgical History  Past Surgical History:  Procedure Laterality Date   A/V FISTULAGRAM Left 03/06/2020   Procedure: A/V FISTULAGRAM;  Surgeon: Waynetta Sandy, MD;  Location: [place] [facility];  Service: Cardiovascular;  Laterality: Left;   ABDOMINAL AORTOGRAM N/A 01/07/2022    Procedure: ABDOMINAL AORTOGRAM;  Surgeon: Waynetta Sandy, MD;  Location: [place] [facility];  Service: Cardiovascular;  Laterality: N/A;   ABDOMINAL AORTOGRAM W/LOWER EXTREMITY N/A 04/09/2021   Procedure: ABDOMINAL AORTOGRAM W/LOWER EXTREMITY;  Surgeon: Waynetta Sandy, MD;  Location: [place] [facility];  Service: Cardiovascular;  Laterality: N/A;   AMPUTATION Left 05/10/2016   Procedure: Left Transmetatarsal Amputation;  Surgeon: [name], MD;  Location: [place];  Service: Orthopedics;  Laterality: Left;   AMPUTATION Right 05/16/2018   Procedure: PARTIAL RAY AMPUTATION FIFTH TOE RIGHT FOOT;  Surgeon: [name], DPM;  Location: [place];  Service: Podiatry;  Laterality: Right;   AMPUTATION Right 06/17/2018   Procedure: AMPUTATION 4th RAY Right Foot;  Surgeon: Trula Slade, DPM;  Location: [place];  Service: Podiatry;  Laterality: Right;   AMPUTATION Right 06/19/2018   Procedure: RIGHT BELOW KNEE AMPUTATION;  Surgeon: [name], MD;  Location: [place];  Service: Orthopedics;  Laterality: Right;   AMPUTATION Left 10/05/2021   Procedure: LEFT RING FINGER AMPUTATION;  Surgeon: Daryll Brod, MD;  Location: [place];  Service: Orthopedics;  Laterality: Left;  45 MIN   AMPUTATION Left 11/15/2021   Procedure: LEFT INDEX FINGER AMPUTATION;  Surgeon: Leanora Cover, MD;  Location: [place];  Service: Orthopedics;  Laterality: Left;  [place] Right 01/08/2022   Procedure: AMPUTATION ABOVE KNEE;  Surgeon: Waynetta Sandy, MD;  Location: [place];  Service: Vascular;  Laterality: Right;   AMPUTATION Bilateral 01/12/2022   Procedure: LEFT INDEX FINGER AMPUTATION, RIGHT INDEX, LONG AND SMALL DIGITS AMPUTATION;  Surgeon: Leanora Cover, MD;  Location: [place];  Service: Orthopedics;  Laterality: Bilateral;   AMPUTATION Right 04/01/2022   Procedure: RIGHT INDEX FINGER AMPUTATION AT METACARPAL PHALANGEAL JOINT, INCISION AND DRAINAGE RIGHT HAND;  Surgeon: Leanora Cover, MD;  Location:  [place];  Service: Orthopedics;  Laterality: Right;  90 MIN   AORTIC ARCH ANGIOGRAPHY N/A 01/07/2022   Procedure: AORTIC ARCH ANGIOGRAPHY;  Surgeon: Waynetta Sandy, MD;  Location: [place] [facility];  Service: Cardiovascular;  Laterality: N/A;   APPLICATION OF WOUND VAC Right 06/19/2018   Procedure: APPLICATION OF WOUND VAC;  Surgeon: [name], MD;  Location: [place];  Service: Orthopedics;  Laterality: Right;   AV FISTULA PLACEMENT Left 11/25/2019   Procedure: LEFT ARM Brachiocephalic  ARTERIOVENOUS (AV) FISTULA CREATION;  Surgeon: Rosetta Posner, MD;  Location: [place];  Service: Vascular;  Laterality: Left;   [place] Left 07/19/2020   Procedure: LEFT SECOND STAGE [place];  Surgeon: Waynetta Sandy, MD;  Location: [place];  Service: Vascular;  Laterality: Left;   CARDIAC CATHETERIZATION  07/29/2018   CIRCUMCISION  09/02/2011   Procedure: CIRCUMCISION ADULT;  Surgeon: Molli Hazard, MD;  Location: [place];  Service: Urology;  Laterality: N/A;   CORONARY STENT INTERVENTION N/A 07/29/2018   Procedure: CORONARY STENT INTERVENTION;  Surgeon: Leonie Man, MD;  Location: [place] [facility];  Service: Cardiovascular;  Laterality: N/A;   DEBRIDEMENT AND CLOSURE WOUND Left 11/15/2021   Procedure: LEFT HAND WOUND CLOSURE;  Surgeon: Leanora Cover, MD;  Location: [place];  Service: Orthopedics;  Laterality: Left;   I & D EXTREMITY  09/02/2011   Procedure: IRRIGATION AND DEBRIDEMENT EXTREMITY;  Surgeon: Theodoro Kos, DO;  Location: [place];  Service: Plastics;  Laterality: N/A;   INCISION AND DRAINAGE OF WOUND  08/12/2011   Procedure: IRRIGATION AND DEBRIDEMENT WOUND;  Surgeon: Zenovia Jarred, MD;  Location: [place];  Service: General;;  perineum   INSERTION OF DIALYSIS CATHETER N/A 11/25/2019   Procedure: INSERTION OF Righ Internal Jugular DIALYSIS CATHETER;  Surgeon: Rosetta Posner, MD;  Location: [place];   Service: Vascular;  Laterality: N/A;   [place]  08/12/2011   Procedure: IRRIGATION AND DEBRIDEMENT ABSCESS;  Surgeon: Molli Hazard, MD;  Location: [place];  Service: Urology;  Laterality: N/A;  irrigation and debridment perineum     IRRIGATION AND DEBRIDEMENT ABSCESS  08/14/2011   Procedure: MINOR INCISION AND DRAINAGE OF ABSCESS;  Surgeon: Molli Hazard, MD;  Location: [place];  Service: Urology;  Laterality: N/A;  Perineal Wound Debridement;Placement Bilateral Testicular Thigh Pouches   LEFT HEART CATH AND CORONARY ANGIOGRAPHY N/A 07/29/2018   Procedure: LEFT HEART CATH AND CORONARY ANGIOGRAPHY;  Surgeon: Leonie Man, MD;  Location: [place] [facility];  Service: Cardiovascular;  Laterality: N/A;   LOWER EXTREMITY ANGIOGRAPHY Bilateral 01/07/2022   Procedure: Lower Extremity Angiography;  Surgeon: Waynetta Sandy, MD;  Location: [place] [facility];  Service: Cardiovascular;  Laterality: Bilateral;   PERIPHERAL VASCULAR ATHERECTOMY Left 04/09/2021   Procedure: PERIPHERAL VASCULAR ATHERECTOMY;  Surgeon: Waynetta Sandy, MD;  Location: [place] [facility];  Service: Cardiovascular;  Laterality: Left;  TP trunk and peroneal arteries   PERIPHERAL VASCULAR BALLOON ANGIOPLASTY Left 04/09/2021   Procedure: PERIPHERAL VASCULAR BALLOON ANGIOPLASTY;  Surgeon: Waynetta Sandy, MD;  Location: [place] [facility];  Service: Cardiovascular;  Laterality: Left;  Popliteal   REVISON OF ARTERIOVENOUS FISTULA Left 05/24/2020   Procedure: LEFT ARM ARTERIOVENOUS FISTULA  CONVERSION TO BASILIC VEIN FISTULA;  Surgeon: Waynetta Sandy, MD;  Location: [place];  Service: Vascular;  Laterality: Left;   TOTAL HIP ARTHROPLASTY Left 10/11/2016   Procedure: LEFT TOTAL HIP ARTHROPLASTY ANTERIOR APPROACH;  Surgeon: Mcarthur Rossetti, MD;  Location: [facility];  Service: Orthopedics;  Laterality: Left;   UPPER EXTREMITY ANGIOGRAPHY Bilateral 01/07/2022    Procedure: Upper Extremity Angiography;  Surgeon: Waynetta Sandy, MD;  Location: [place] [facility];  Service: Cardiovascular;  Laterality: Bilateral;   Family History  Family History  Problem Relation Age of Onset   Diabetes Other    Pancreatic cancer Mother 29   Colon cancer Neg Hx    Colon polyps Neg Hx    Esophageal cancer Neg Hx    Rectal cancer Neg Hx    Stomach cancer Neg Hx    Social History  reports that he quit smoking about 2 years ago. His smoking use included cigarettes. He has a 6.00 pack-year smoking history. He has never used smokeless tobacco. He reports that he does not currently use alcohol. He reports that he does not use drugs. Allergies  Allergies  Allergen Reactions   Crestor [Rosuvastatin] Rash    Also had a red eye   Dilaudid [Hydromorphone] Itching   Benadryl [Diphenhydramine] Rash   Home medications Prior to Admission medications   Medication Sig Start Date End Date Taking? Authorizing Provider  acetaminophen (TYLENOL) 500 MG tablet Take 500-1,000 mg by mouth every 6 (six) hours as needed for moderate pain or headache.    [provider]  allopurinol (ZYLOPRIM) 100 MG tablet Take 100 mg by mouth daily. 02/01/22   [provider]  aspirin 81 MG chewable tablet Chew 81 mg by mouth daily. 01/24/22   [provider]  atorvastatin (LIPITOR) 80 MG tablet Take 80 mg by mouth in the morning.    [provider]  bismuth subsalicylate (PEPTO BISMOL) 262 MG/15ML suspension Take 30 mLs by mouth every 6 (six) hours as needed for indigestion.    [provider]  blood glucose meter kit and supplies Dispense based on patient and insurance preference. Use up to four times daily as directed. (FOR ICD-9 250.00, 250.01). 07/01/18   Angiulli, Lavon Paganini, PA-C  clopidogrel (PLAVIX) 75 MG tablet TAKE ONE TABLET BY MOUTH ONCE DAILY 02/01/22   Waynetta Sandy, MD  ezetimibe (ZETIA) 10 MG tablet Take 1 tablet (10 mg total)  by mouth daily. 01/17/22   Danford, Suann Larry, MD  gabapentin (NEURONTIN) 100 MG capsule Take 100 mg by mouth 3 (three) times daily. 12/03/21   [provider]  insulin aspart protamine - aspart (NOVOLOG MIX 70/30 FLEXPEN) (70-30) 100 UNIT/ML FlexPen Inject 0.3 mLs (30 Units total) into the skin 2 (two) times daily with a meal. Patient taking differently: Inject 30 Units into the skin 2 (two) times daily with a meal. 09/29/20   Lavina Hamman, MD  Insulin Pen Needle 31G X 5 MM MISC Use daily to inject insulin as instructed 07/01/18   Angiulli, Lavon Paganini, PA-C  linaclotide (LINZESS) 72 MCG capsule Take 1 capsule (72 mcg total) by mouth daily before  breakfast. Patient taking differently: Take 72 mcg by mouth in the morning. 12/13/20   Armbruster, Carlota Raspberry, MD  pantoprazole (PROTONIX) 40 MG tablet TAKE ONE TABLET BY MOUTH EVERY MORNING and TAKE ONE TABLET BY MOUTH EVERYDAY AT BEDTIME Patient taking differently: Take 40 mg by mouth daily. 02/01/22   Willia Craze, NP  traMADol (ULTRAM) 50 MG tablet Take 50 mg by mouth every 6 (six) hours as needed for pain. 03/22/22   [provider]  triamcinolone ointment (KENALOG) 0.1 % 1 application. daily as needed (rash). 09/04/21   [provider]  VELPHORO 500 MG chewable tablet Chew 1,000 mg by mouth 3 (three) times daily with meals. 06/14/20   [provider]     Vitals:   05/10/22 1013 05/10/22 1026 05/10/22 1030 05/10/22 1110  BP: (!) 88/70  98/67 119/74  Pulse: (!) 51  (!) 54 (!) 57  Resp: (!) 24  (!) 27 12  Temp: 98.1 F (36.7 C)     TempSrc: Oral     SpO2:  100% 100% 100%  Weight:      Height:       Exam Gen alert, no distress No rash, cyanosis or gangrene Sclera anicteric, throat clear  No jvd or bruits Chest clear bilat to bases, no rales/ wheezing RRR no MRG Abd soft ntnd no mass or ascites +bs GU normal male Ext R BKA, L TMA, 1-2+ L pretib edema Multiple finger amputations bilat Neuro is alert,  Ox 3 , nf    LUA AVF +bruit   Home meds include - allopurinol, aspirin, atorvastatin, clopidogrel, ezetimibe, gabapentin, insulin 70/30, linaclotide, pantoprazole, tramadol prn, velhporo 1 gm ac tid, prns/ vits/ supps     EKG atrial fib, IVCD, QRS 122 msec   Na 132  K 7.5  CO2 13  BUN 78  creat 10.9  Ca 8.8    AG 26  Alb 3.3  LA 6.4     Atrial fib HR 55-60, RR 08-09-26, temp 98  RA 100%  BP's 90s- 110s    Hb 10.7  plt 117  wbc 7k    CXR 9/22 - IMPRESSION: Cardiomegaly. Lungs clear. No active disease.    [facility]  4.5h  425/ 700   2/2 bath  LUE AVF  Heparin 8000+ 2046mdrun - pending   Assessment/ Plan: Hyperkalemia - sp temporizing measures in ED. Bradycardia per EMS but in ED the heart rate is reasonable at 55-60 bpm, possibly w/ afib. Plan HD upstairs as soon as possible, using low K bath.   ESRD - on HD TTS.  Missed 1 HD on 9/20. HD today as above.  IDDM - type 2 PAD - multiple amputations Anemia esrd - Hb 10-12 here, no esa needs, get records MBD ckd - CCa in range, will add on phos. Get records.    RKelly Splinter MD 05/10/2022, 12:19 PM Recent Labs  Lab 05/10/22 1023 05/10/22 1031  HGB 10.7* 11.9*  ALBUMIN 3.3*  --   CALCIUM 8.8*  --   CREATININE 10.95* 11.90*  K 7.5* 7.2*   Inpatient medications:   HYDROmorphone (DILAUDID) injection  1 mg Intravenous Once   insulin aspart  5 Units Intravenous Once

## 2022-05-10 NOTE — Procedures (Signed)
   I was present at this dialysis session, have reviewed the session itself and made  appropriate changes Kelly Splinter MD [place] pager [phone]   05/10/2022, 2:52 PM

## 2022-05-10 NOTE — Consult Note (Signed)
WOC consulted however reviewed last orthopedic notes 05/06/22, pt has been seeing therapist and receiving outpatient hydrotherapy. I have requested admitting MD to consult orthopedics since they recently operated on this gentleman and have been seeing him regularly for an open hand wound.  Re consult if needed, will not follow at this time. Thanks  Jareb Radoncic [employer], RN,CWOCN, CNS, [place] [phone])

## 2022-05-10 NOTE — Progress Notes (Signed)
I talk with the pt, and he states, that he is allergy to Dilaudid and the pt receive Dilaudid in ER. I will call the Dr.for  some difference medication for pain.

## 2022-05-11 DIAGNOSIS — Z23 Encounter for immunization: Secondary | ICD-10-CM | POA: Diagnosis not present

## 2022-05-11 DIAGNOSIS — N186 End stage renal disease: Secondary | ICD-10-CM | POA: Diagnosis not present

## 2022-05-11 DIAGNOSIS — E1122 Type 2 diabetes mellitus with diabetic chronic kidney disease: Secondary | ICD-10-CM | POA: Diagnosis not present

## 2022-05-11 DIAGNOSIS — I132 Hypertensive heart and chronic kidney disease with heart failure and with stage 5 chronic kidney disease, or end stage renal disease: Secondary | ICD-10-CM | POA: Diagnosis not present

## 2022-05-11 DIAGNOSIS — Z79899 Other long term (current) drug therapy: Secondary | ICD-10-CM | POA: Diagnosis not present

## 2022-05-11 DIAGNOSIS — Z7902 Long term (current) use of antithrombotics/antiplatelets: Secondary | ICD-10-CM | POA: Diagnosis not present

## 2022-05-11 DIAGNOSIS — Z794 Long term (current) use of insulin: Secondary | ICD-10-CM | POA: Diagnosis not present

## 2022-05-11 DIAGNOSIS — I5030 Unspecified diastolic (congestive) heart failure: Secondary | ICD-10-CM | POA: Diagnosis not present

## 2022-05-11 DIAGNOSIS — Z992 Dependence on renal dialysis: Secondary | ICD-10-CM | POA: Diagnosis not present

## 2022-05-11 DIAGNOSIS — E875 Hyperkalemia: Secondary | ICD-10-CM | POA: Diagnosis not present

## 2022-05-11 DIAGNOSIS — I2510 Atherosclerotic heart disease of native coronary artery without angina pectoris: Secondary | ICD-10-CM | POA: Diagnosis not present

## 2022-05-11 DIAGNOSIS — Z7982 Long term (current) use of aspirin: Secondary | ICD-10-CM | POA: Diagnosis not present

## 2022-05-11 LAB — COMPREHENSIVE METABOLIC PANEL
ALT: 40 U/L (ref 0–44)
AST: 34 U/L (ref 15–41)
Albumin: 3.2 g/dL — ABNORMAL LOW (ref 3.5–5.0)
Alkaline Phosphatase: 132 U/L — ABNORMAL HIGH (ref 38–126)
Anion gap: 18 — ABNORMAL HIGH (ref 5–15)
BUN: 40 mg/dL — ABNORMAL HIGH (ref 6–20)
CO2: 24 mmol/L (ref 22–32)
Calcium: 9 mg/dL (ref 8.9–10.3)
Chloride: 92 mmol/L — ABNORMAL LOW (ref 98–111)
Creatinine, Ser: 6.86 mg/dL — ABNORMAL HIGH (ref 0.61–1.24)
GFR, Estimated: 9 mL/min — ABNORMAL LOW (ref >60)
Glucose, Bld: 127 mg/dL — ABNORMAL HIGH (ref 70–99)
Potassium: 4.2 mmol/L (ref 3.5–5.1)
Sodium: 134 mmol/L — ABNORMAL LOW (ref 135–145)
Total Bilirubin: 1 mg/dL (ref 0.3–1.2)
Total Protein: 7.2 g/dL (ref 6.5–8.1)

## 2022-05-11 LAB — GLUCOSE, CAPILLARY
Glucose-Capillary: 157 mg/dL — ABNORMAL HIGH (ref 70–99)
Glucose-Capillary: 246 mg/dL — ABNORMAL HIGH (ref 70–99)

## 2022-05-11 LAB — LACTIC ACID, PLASMA
Lactic Acid, Venous: 3.8 mmol/L (ref 0.5–1.9)
Lactic Acid, Venous: 2.3 mmol/L (ref 0.5–1.9)
Lactic Acid, Venous: 5.9 mmol/L (ref 0.5–1.9)
Lactic Acid, Venous: 1.5 mmol/L (ref 0.5–1.9)

## 2022-05-11 LAB — CBC
HCT: 28.2 % — ABNORMAL LOW (ref 39.0–52.0)
Hemoglobin: 9.4 g/dL — ABNORMAL LOW (ref 13.0–17.0)
MCH: 32.1 pg (ref 26.0–34.0)
MCHC: 33.3 g/dL (ref 30.0–36.0)
MCV: 96.2 fL (ref 80.0–100.0)
Platelets: 133 10*3/uL — ABNORMAL LOW (ref 150–400)
RBC: 2.93 MIL/uL — ABNORMAL LOW (ref 4.22–5.81)
RDW: 18.3 % — ABNORMAL HIGH (ref 11.5–15.5)
WBC: 7.9 10*3/uL (ref 4.0–10.5)
nRBC: 0.3 % — ABNORMAL HIGH (ref 0.0–0.2)

## 2022-05-11 LAB — VITAMIN B12: Vitamin B-12: 1181 pg/mL — ABNORMAL HIGH (ref 180–914)

## 2022-05-11 LAB — HEPATITIS B SURFACE ANTIBODY, QUANTITATIVE: Hep B S AB Quant (Post): 18.6 m[IU]/mL (ref >9.9)

## 2022-05-11 MED ORDER — INFLUENZA VAC SPLIT QUAD 0.5 ML IM SUSY: 0.5000 mL | PREFILLED_SYRINGE | INTRAMUSCULAR | Status: DC

## 2022-05-11 MED ORDER — INFLUENZA VAC SPLIT QUAD 0.5 ML IM SUSY
0.5000 mL | PREFILLED_SYRINGE | INTRAMUSCULAR | Status: AC | PRN
  Administered 2022-05-11: 0.5 mL via INTRAMUSCULAR
  Filled 2022-05-11: qty 0.5

## 2022-05-11 MED ORDER — LACTATED RINGERS IV BOLUS
250.0000 mL | Freq: Once | INTRAVENOUS | Status: AC
  Administered 2022-05-11: 250 mL via INTRAVENOUS

## 2022-05-11 NOTE — Progress Notes (Signed)
PT/Hydrotherapy Cancellation Note  Patient Details Name: Edward [name] MRN: [id number] DOB: [birth year]   Cancelled Treatment:    Reason Eval/Treat Not Completed: Other (comment). Order received for hydrotherapy. Pt has been receiving outpt therapy at [facility]. Per MD pt leaving today after HD and can resume outpt treatment. Will sign off.    Shary Decamp [facility] 05/11/2022, 11:45 AM [place] Office [phone]

## 2022-05-11 NOTE — Progress Notes (Signed)
[facility] Progress Note  Subjective: seen in room, K+ down to 4s today.  Feeling better, R shoulder pain better. LA down to 2s today. Wants to go home.   Vitals:   05/10/22 2054 05/11/22 0104 05/11/22 0619 05/11/22 0757  BP: (!) 106/93 125/75 116/62 110/74  Pulse: 97 83 87 79  Resp: '19 16 11 18  '$ Temp: 98.1 F (36.7 C) 98 F (36.7 C) 98 F (36.7 C) 98.1 F (36.7 C)  TempSrc: Oral Oral Oral Oral  SpO2: 94% 100% 100%   Weight: 85 kg     Height: '6\' 1"'$  (1.854 m)       Exam: Gen alert, no distress No jvd or bruits Chest clear bilat to bases RRR no MRG Abd soft ntnd no mass or ascites +bs Ext R BKA, L TMA, 1+ L pretib edema Multiple finger amputations bilat Neuro is alert, Ox 3 , nf    LUA AVF +bruit    Home meds include - allopurinol, aspirin, atorvastatin, clopidogrel, ezetimibe, gabapentin, insulin 70/30, linaclotide, pantoprazole, tramadol prn, velhporo 1 gm ac tid, prns/ vits/ supps      EKG atrial fib, IVCD, QRS 122 msec   Na 132  K 7.5  CO2 13  BUN 78  creat 10.9  Ca 8.8    AG 26  Alb 3.3  LA 6.4     Atrial fib HR 55-60, RR 08-09-26, temp 98  RA 100%  BP's 90s- 110s    Hb 10.7  plt 117  wbc 7k    CXR 9/22 - IMPRESSION: Cardiomegaly. Lungs clear. No active disease.     [facility]  4.5h  425/ 700   2/2 bath  LUE AVF  Heparin 8000+ 2052mdrun - pending     Assessment/ Plan: Hyperkalemia - K+ 7.5 in ED, got temporizing measures and then had HD last night. K+ 4.2 this am. Pt feeling a lot better.  R shoulder pain - none further, per pmd ESRD - on HD TTS.  Missed 1 HD on 9/20. HD last night. Plan HD today here.  IDDM - type 2 PAD - multiple amputations Anemia esrd - Hb 10-12 here, no esa needs.  MBD ckd - CCa in range, cont meds Dispo - should be okay for dc after HD here today     Rob Angellica Maddison 05/11/2022, 10:06 AM   Recent Labs  Lab 05/10/22 1023 05/10/22 1031 05/11/22 0134  HGB 10.7* 11.9* 9.4*  ALBUMIN 3.3*  --  3.2*  CALCIUM 8.8*   --  9.0  CREATININE 10.95* 11.90* 6.86*  K 7.5* 7.2* 4.2   No results for input(s): "IRON", "TIBC", "FERRITIN" in the last 168 hours. Inpatient medications:  aspirin  81 mg Oral Daily   atorvastatin  80 mg Oral Daily   Chlorhexidine Gluconate Cloth  6 each Topical Q0600   Chlorhexidine Gluconate Cloth  6 each Topical Q0600   clopidogrel  75 mg Oral Daily   ezetimibe  10 mg Oral Daily   gabapentin  100 mg Oral TID   heparin  5,000 Units Subcutaneous Q8H   insulin aspart  0-6 Units Subcutaneous TID WC   insulin aspart  5 Units Intravenous Once   lidocaine  1 patch Transdermal Q24H   pantoprazole  40 mg Oral Daily    acetaminophen **OR** acetaminophen, camphor-menthol, fentaNYL (SUBLIMAZE) injection, HYDROcodone-acetaminophen, polyethylene glycol

## 2022-05-11 NOTE — Discharge Instructions (Addendum)
Dear [name], thank you for trusting [place] with your care.   We treated you in the hospital for high potassium. This was likely causing your heart rate to be slow and blood pressure to be low.  This has resolved since since dialysis yesterday. We recommend that you follow up with your primary care doctor in 1-2 weeks. We also recommend that you continue to see your kidney doctors and dialysis. Lastly, please follow up with Dr. Fredna Dow in 1 week for your hand.

## 2022-05-11 NOTE — Progress Notes (Signed)
Received patient in bed to unit.  Alert and oriented.  Informed consent signed and in chart.   Treatment initiated: 1330 Treatment completed: 1637  Patient tolerated well.  Transported back to the room  Alert, without acute distress.  Hand-off given to patient's nurse.   Access used: AVF Access issues: none  Total UF removed: 2000 ml Medication(s) given: none Post HD VS: 97.9, 82, 12, 126/76 Post HD weight: 86.7 kg  Dressing c/d/i   [facility]

## 2022-05-11 NOTE — Discharge Summary (Signed)
Name: Edward [name] MRN: [id number] DOB: Dec 29, 1970 51 y.o. PCP: [name], MD  Date of Admission: 05/10/2022 10:07 AM Date of Discharge: 05/11/22 Attending Physician: Dr. Dareen Piano  Discharge Diagnosis: Principal Problem:   ESRD on hemodialysis [facility]) Active Problems:   Diabetes mellitus with complication, with long-term current use of insulin ([place])   Hyperkalemia   S/P BKA (below knee amputation) unilateral, right (HCC)   PAD (peripheral artery disease) ([place])   Cervical lesion    Discharge Medications: Allergies as of 05/11/2022       Reactions   Crestor [rosuvastatin] Rash   Eye redness   Dilaudid [hydromorphone] Itching   Robaxin [methocarbamol] Other (See Comments)   Pt states medication made his arm pain worse.   Benadryl [diphenhydramine] Rash        Medication List     TAKE these medications    acetaminophen 500 MG tablet Commonly known as: TYLENOL Take 500-1,000 mg by mouth every 6 (six) hours as needed for moderate pain or headache.   ALPRAZolam 0.25 MG tablet Commonly known as: XANAX Take 0.25 mg by mouth daily as needed for anxiety.   aspirin EC 81 MG tablet Take 81 mg by mouth daily. Swallow whole.   atorvastatin 80 MG tablet Commonly known as: LIPITOR Take 80 mg by mouth in the morning.   bismuth subsalicylate 256 LS/93TD suspension Commonly known as: PEPTO BISMOL Take 30 mLs by mouth every 6 (six) hours as needed for indigestion.   clopidogrel 75 MG tablet Commonly known as: PLAVIX TAKE ONE TABLET BY MOUTH ONCE DAILY   ezetimibe 10 MG tablet Commonly known as: ZETIA Take 1 tablet (10 mg total) by mouth daily.   gabapentin 100 MG capsule Commonly known as: NEURONTIN Take 100 mg by mouth 3 (three) times daily.   HumaLOG Mix 75/25 KwikPen (75-25) 100 UNIT/ML Kwikpen Generic drug: Insulin Lispro Prot & Lispro Inject 30 Units into the skin daily.   HYDROcodone-acetaminophen 5-325 MG tablet Commonly known as: NORCO/VICODIN Take 1  tablet by mouth every 6 (six) hours as needed (pain). Notes to patient: Last dose at 9:39 am today   Insulin Pen Needle 31G X 5 MM Misc Use daily to inject insulin as instructed   linaclotide 72 MCG capsule Commonly known as: LINZESS Take 1 capsule (72 mcg total) by mouth daily before breakfast. What changed: when to take this   NovoLOG Mix 70/30 FlexPen (70-30) 100 UNIT/ML FlexPen Generic drug: insulin aspart protamine - aspart Inject 0.3 mLs (30 Units total) into the skin 2 (two) times daily with a meal.   pantoprazole 40 MG tablet Commonly known as: PROTONIX TAKE ONE TABLET BY MOUTH EVERY MORNING and TAKE ONE TABLET BY MOUTH EVERYDAY AT BEDTIME What changed: See the new instructions.   traMADol 50 MG tablet Commonly known as: ULTRAM Take 50 mg by mouth every 6 (six) hours as needed for pain.   triamcinolone ointment 0.1 % Commonly known as: KENALOG Apply 1 Application topically daily as needed (rash).   Velphoro 500 MG chewable tablet Generic drug: sucroferric oxyhydroxide Chew 1,000 mg by mouth 3 (three) times daily with meals.               Discharge Care Instructions  (From admission, onward)           Start     Ordered   05/11/22 0000  Discharge wound care:       Comments: Keep wound clean, dry, and intact.   05/11/22 [id number]  05/11/22 0000  Discharge wound care:       Comments: Keep wound clean, dry, and intact. Follow up with Dr. Fredna Dow for hand wound.   05/11/22 [id number]            Disposition and follow-up:   Edward [name] was discharged from [facility] in Stable condition.  At the hospital follow up visit please address:  1.  Follow-up:  a. Hyperkalemia - had missed several HD sessions. Resolved on HD    b. Bradycardia/hypotension - due to hyperkalemia. Resolved with resolution of hyperkalemia.  Lactic acidosis resolved with improvement in hypotension   c. R index finger necrosis - no signs active infection. Did not  complete hydrotherapy prior to Discharge. Needs outpatient hydrotherapy    d. Right pectoral muscle strain - suspected muscle strain . Resolved with tylenol and lidocaine patch   E. Incidental cervical lesion - possibly related to pectoral pain.  2.  Labs / imaging needed at time of follow-up: CBC, BMP, EKG  3.  Pending labs/ test needing follow-up: none  4.  Medication Changes  Started: none  Follow-up Appointments:  Follow-up Information     [name], MD Follow up in 1 week(s).   Specialty: Family Medicine Contact information: [address] [place] [address] [phone]         Lelon Perla, MD .   Specialty: Cardiology Contact information: [address] [place] [address] [phone]         Leanora Cover, MD. Call.   Specialty: Orthopedic Surgery Contact information: [address] [place] [address] [phone]                 Hospital Course by problem list:  Patient presented to the hospital with complaints of right  shoulder pain. He was noted to be hyperkalemic with bradycardia and hypotension during initial evaluation. Received fluid boluses in the ED for volume resuscitation. He was admitted and taken for emergent HD with nephrology. Patient had shown great improvement the following day. Resolution of his bradycardia and hypotension following fluid resuscitation and HD. Lactic acid return to normal levels. Patient stated he felt well and wanted to go home. Advised patient he stay for HD on day of discharge to stay on schedule. Patient requested discharge despite this, however ultimately stayed for HD as he would not have been able to make outpatient HD session. Discharge orders were placed with plans for patient to leave after completing HD session. He was unable to receive hydrotherapy for his right hand wound prior to DC. Advised he follow up with his outpatient ortho surgeon in 1 week.  Discharge  Subjective: Patient feeling well. Wanting to go home. Shoulder pain has resolved. He still feels some itching in head, back, arms, but this is tolerable.   Discharge Exam:   BP 126/76 (BP Location: Right Arm)   Pulse 82   Temp 97.9 F (36.6 C)   Resp 12   Ht '6\' 1"'$  (1.854 m)   Wt 86.7 kg   SpO2 100%   BMI 25.22 kg/m  Constitutional:      General: He is not in acute distress.    Comments: Chronically ill-appearing  Cardiovascular:     Rate and Rhythm: Normal rate and regular rhythm.     Heart sounds: Normal heart sounds.     Comments: no edema of left lower extremity Pulmonary:     Effort: Pulmonary effort is normal. No respiratory  distress.     Breath sounds: Normal breath sounds. No wheezing.  Skin:    General: Skin is warm and dry.  Neurological:     General: No focal deficit present.  MSK: normal ROM right shoulder.  Pertinent Labs, Studies, and Procedures:     Latest Ref Rng & Units 05/11/2022    1:34 AM 05/10/2022   10:31 AM 05/10/2022   10:23 AM  CBC  WBC 4.0 - 10.5 K/uL 7.9   7.0   Hemoglobin 13.0 - 17.0 g/dL 9.4  11.9  10.7   Hematocrit 39.0 - 52.0 % 28.2  35.0  33.1   Platelets 150 - 400 K/uL 133   117        Latest Ref Rng & Units 05/11/2022    1:34 AM 05/10/2022   10:31 AM 05/10/2022   10:23 AM  CMP  Glucose 70 - 99 mg/dL 127  95  100   BUN 6 - 20 mg/dL 40  85  78   Creatinine 0.61 - 1.24 mg/dL 6.86  11.90  10.95   Sodium 135 - 145 mmol/L 134  132  136   Potassium 3.5 - 5.1 mmol/L 4.2  7.2  7.5  C  Chloride 98 - 111 mmol/L 92  105  97   CO2 22 - 32 mmol/L 24   13   Calcium 8.9 - 10.3 mg/dL 9.0   8.8   Total Protein 6.5 - 8.1 g/dL 7.2   7.5   Total Bilirubin 0.3 - 1.2 mg/dL 1.0   1.3   Alkaline Phos 38 - 126 U/L 132   162   AST 15 - 41 U/L 34   63   ALT 0 - 44 U/L 40   49     C Corrected result    CT Angio Neck W and/or Wo Contrast  Result Date: 05/10/2022 CLINICAL DATA:  Vertebral artery dissection suspected. Right shoulder pains. Bradycardia.  Patient missed dialysis yesterday. EXAM: CT ANGIOGRAPHY NECK TECHNIQUE: Multidetector CT imaging of the neck was performed using the standard protocol during bolus administration of intravenous contrast. Multiplanar CT image reconstructions and MIPs were obtained to evaluate the vascular anatomy. Carotid stenosis measurements (when applicable) are obtained utilizing NASCET criteria, using the distal internal carotid diameter as the denominator. RADIATION DOSE REDUCTION: This exam was performed according to the departmental dose-optimization program which includes automated exposure control, adjustment of the mA and/or kV according to patient size and/or use of iterative reconstruction technique. CONTRAST:  138m OMNIPAQUE IOHEXOL 350 MG/ML SOLN COMPARISON:  None Available. FINDINGS: Aortic arch: Atherosclerotic calcifications are present at the aortic arch. No aneurysm or stenosis is present. Right carotid system: The right common carotid artery is within normal limits. Calcifications are present at the right carotid bifurcation. There is some irregularity through the scratched at no significant stenosis is present relative to the more distal vessel. Mild irregularity is present throughout the mid cervical right ICA without focal stenosis or dissection. Left carotid system: The left common carotid artery demonstrates some mural atherosclerotic change. Calcifications are present bifurcation without a significant stenosis. Since the calcifications are present within the external carotid branches. No focal stenosis is present in the left internal carotid artery. Vertebral arteries: The left vertebral artery is the dominant vessel. Both vertebral arteries originate from the subclavian arteries without significant stenosis. No significant focal stenosis or vessel injury is present in either vertebral artery in the neck. There is some streak artifact from right-sided hearing the level of the  craniocervical junction. Mild  irregularity is present within the intracranial vertebral arteries. Vertebrobasilar junction is within normal limits. Basilar artery is normal. Both posterior cerebral arteries originate from the basilar artery. Skeleton: Somewhat ill-defined ground-glass lesion is present in the inferior trachea leading facet of C2 on the right. There may be some involvement of the superior tracheal and facet of C3. No acute fracture is present. No other focal osseous lesions are present. Lesion extends scratched at the lesion measures up to 11 mm and ex dens beyond the confines of the facet. The lesion is separate from the vertebral artery. Other neck: Edematous changes are present in the subcutaneous soft tissues. No discrete soft tissue lesions are present. No significant adenopathy is present. Upper chest: Ground-glass attenuation is present in the lungs, left greater than right. No discrete lesions are present. No significant effusion is present. IMPRESSION: 1. No significant focal stenosis or vessel injury in the neck. 2. Somewhat ill-defined ground-glass lesion in the inferior trachea leading facet of C2 on the right with some involvement of the superior tracheal and facet of C3. The lesion is separate from the vertebral artery. This may represent a metastasis or myeloma. Brown tumor or focal renal osteodystrophy is also considered. A benign fibrous lesion could have this appearance. Recommend whole-body bone scan to look for other lesions. If this is negative, a follow-up CT without contrast in 3-6 months is recommended. 3. Atherosclerotic calcifications at the carotid bifurcations and intracranial vessels without significant stenosis. 4. Mild irregularity throughout the cervical right ICA and intracranial vertebral arteries. This may be related to atherosclerotic change. Vasculitis or FMD is considered. 5. Ground-glass attenuation in the lungs, left greater than right. This may represent atelectasis, edema, or infection.  6. Aortic Atherosclerosis (ICD10-I70.0). These results were called by telephone at the time of interpretation on 05/10/2022 at 12:36 pm to provider [facility] , who verbally acknowledged these results. Electronically Signed   By: San Morelle M.D.   On: 05/10/2022 12:39   CT Angio Chest/Abd/Pel for Dissection W and/or Wo Contrast  Result Date: 05/10/2022 CLINICAL DATA:  Chest pain and back pain, aortic dissection suspected. EXAM: CT ANGIOGRAPHY CHEST, ABDOMEN AND PELVIS TECHNIQUE: Non-contrast CT of the chest was initially obtained. Multidetector CT imaging through the chest, abdomen and pelvis was performed using the standard protocol during bolus administration of intravenous contrast. Multiplanar reconstructed images and MIPs were obtained and reviewed to evaluate the vascular anatomy. RADIATION DOSE REDUCTION: This exam was performed according to the departmental dose-optimization program which includes automated exposure control, adjustment of the mA and/or kV according to patient size and/or use of iterative reconstruction technique. CONTRAST:  152m OMNIPAQUE IOHEXOL 350 MG/ML SOLN COMPARISON:  None Available. FINDINGS: CTA CHEST FINDINGS Cardiovascular: Preferential opacification of the thoracic aorta. No evidence of thoracic aortic aneurysm or dissection. Normal is enlarged. No pericardial effusion. Advanced atherosclerotic calcification of coronary arteries. Main pulmonary trunk is dilated measuring up to 3.3 cm concerning for pulmonary arterial hypertension. Back lumbar puncture Mediastinum/Nodes: No enlarged mediastinal, hilar, or axillary lymph nodes. Thyroid gland, trachea, and esophagus demonstrate no significant findings. Lungs/Pleura: Mosaic attenuation of the lung parenchyma. Emphysematous changes in the right middle and posterosuperior portion of the left upper lobe. No evidence of pneumonia. No pleural effusion or pneumothorax. Musculoskeletal: No chest wall abnormality. No acute or  significant osseous findings. Review of the MIP images confirms the above findings. CTA ABDOMEN AND PELVIS FINDINGS VASCULAR Aorta: Normal caliber aorta without aneurysm, dissection, vasculitis or significant stenosis. Celiac:  Patent without evidence of aneurysm, dissection, vasculitis or significant stenosis. SMA: Patent without evidence of aneurysm, dissection, vasculitis or significant stenosis. Renals: Both renal arteries are patent without evidence of aneurysm, dissection, vasculitis, fibromuscular dysplasia or significant stenosis. IMA: Patent without evidence of aneurysm, dissection, vasculitis or significant stenosis. Inflow: Patent without evidence of aneurysm, dissection, vasculitis or significant stenosis. Veins: No obvious venous abnormality within the limitations of this arterial phase study. Review of the MIP images confirms the above findings. NON-VASCULAR Hepatobiliary: Nodular contour of the liver suggesting hepatic cirrhosis. Pancreas: Unremarkable. No pancreatic ductal dilatation or surrounding inflammatory changes. Spleen: Normal in size without focal abnormality. Adrenals/Urinary Tract: Adrenal glands are unremarkable. Kidneys are normal, without renal calculi, focal lesion, or hydronephrosis. Thickening of the urinary bladder wall concerning for cystitis. Stomach/Bowel: Stomach is within normal limits. Appendix not clearly identified. No evidence of bowel wall thickening, distention, or inflammatory changes. Lymphatic: No lymphadenopathy. Reproductive: Prostate is unremarkable. Other: Mild-to-moderate dependent ascites. Mild perihepatic ascites. Musculoskeletal: Status post left hip arthroplasty. Mild degenerate disc disease of the lumbar spine. Review of the MIP images confirms the above findings. IMPRESSION: 1. No evidence of aortic dissection or acute vascular abnormality. 2. Cardiomegaly with prominent coronary artery atherosclerotic calcifications. 3. Main pulmonary trunk is dilated  measuring up to 3.3 cm concerning for pulmonary arterial hypertension. 4. Emphysematous changes of the right middle and left upper lobe with mosaic attenuation of the lung parenchyma, no evidence of pneumonia or pleural effusion. 5. Cirrhotic liver with mild-to-moderate ascites. 6. Thickening of the urinary bladder wall suggesting cystitis, correlate with urinalysis. 7. Bowel loops are normal in caliber without evidence of obstruction. 8. Mild generalized anasarca. Electronically Signed   By: Keane Police D.O.   On: 05/10/2022 12:23   DG Chest Portable 1 View  Result Date: 05/10/2022 CLINICAL DATA:  Sepsis EXAM: PORTABLE CHEST 1 VIEW COMPARISON:  01/09/2022 FINDINGS: Cardiomegaly.  Lungs clear.  No effusions or acute bony abnormality. IMPRESSION: Cardiomegaly.  No active disease. Electronically Signed   By: Rolm Baptise M.D.   On: 05/10/2022 10:44     Discharge Instructions: Discharge Instructions     Call MD for:  difficulty breathing, headache or visual disturbances   Complete by: As directed    Call MD for:  extreme fatigue   Complete by: As directed    Call MD for:  hives   Complete by: As directed    Call MD for:  persistant dizziness or light-headedness   Complete by: As directed    Call MD for:  persistant nausea and vomiting   Complete by: As directed    Call MD for:  redness, tenderness, or signs of infection (pain, swelling, redness, odor or green/yellow discharge around incision site)   Complete by: As directed    Call MD for:  severe uncontrolled pain   Complete by: As directed    Call MD for:  temperature >100.4   Complete by: As directed    Diet - low sodium heart healthy   Complete by: As directed    Discharge wound care:   Complete by: As directed    Keep wound clean, dry, and intact.   Discharge wound care:   Complete by: As directed    Keep wound clean, dry, and intact. Follow up with Dr. Fredna Dow for hand wound.   Increase activity slowly   Complete by: As directed         Signed: Delene Ruffini, MD 05/11/2022, 4:51 PM   Pager: [phone]

## 2022-05-11 NOTE — Care Management Obs Status (Signed)
[facility] NOTIFICATION   Patient Details  Name: LUE DUBUQUE MRN: [id number] Date of Birth: 07-Nov-1970   Medicare Observation Status Notification Given:  Yes    Azuree Minish G., RN 05/11/2022, 10:12 AM

## 2022-05-13 DIAGNOSIS — I96 Gangrene, not elsewhere classified: Secondary | ICD-10-CM | POA: Diagnosis not present

## 2022-05-13 DIAGNOSIS — R52 Pain, unspecified: Secondary | ICD-10-CM | POA: Diagnosis not present

## 2022-05-13 DIAGNOSIS — T148XXA Other injury of unspecified body region, initial encounter: Secondary | ICD-10-CM | POA: Diagnosis not present

## 2022-05-13 DIAGNOSIS — I998 Other disorder of circulatory system: Secondary | ICD-10-CM | POA: Diagnosis not present

## 2022-05-13 LAB — GLUCOSE, CAPILLARY: Glucose-Capillary: 115 mg/dL — ABNORMAL HIGH (ref 70–99)

## 2022-05-14 DIAGNOSIS — D689 Coagulation defect, unspecified: Secondary | ICD-10-CM | POA: Diagnosis not present

## 2022-05-14 DIAGNOSIS — N2581 Secondary hyperparathyroidism of renal origin: Secondary | ICD-10-CM | POA: Diagnosis not present

## 2022-05-14 DIAGNOSIS — D631 Anemia in chronic kidney disease: Secondary | ICD-10-CM | POA: Diagnosis not present

## 2022-05-14 DIAGNOSIS — N186 End stage renal disease: Secondary | ICD-10-CM | POA: Diagnosis not present

## 2022-05-14 DIAGNOSIS — R52 Pain, unspecified: Secondary | ICD-10-CM | POA: Diagnosis not present

## 2022-05-14 DIAGNOSIS — Z992 Dependence on renal dialysis: Secondary | ICD-10-CM | POA: Diagnosis not present

## 2022-05-15 ENCOUNTER — Encounter (HOSPITAL_BASED_OUTPATIENT_CLINIC_OR_DEPARTMENT_OTHER): Payer: Medicare Other | Attending: General Surgery | Admitting: General Surgery

## 2022-05-15 DIAGNOSIS — N186 End stage renal disease: Secondary | ICD-10-CM | POA: Diagnosis not present

## 2022-05-15 DIAGNOSIS — Z89432 Acquired absence of left foot: Secondary | ICD-10-CM | POA: Insufficient documentation

## 2022-05-15 DIAGNOSIS — I132 Hypertensive heart and chronic kidney disease with heart failure and with stage 5 chronic kidney disease, or end stage renal disease: Secondary | ICD-10-CM | POA: Insufficient documentation

## 2022-05-15 DIAGNOSIS — I5042 Chronic combined systolic (congestive) and diastolic (congestive) heart failure: Secondary | ICD-10-CM | POA: Insufficient documentation

## 2022-05-15 DIAGNOSIS — E11622 Type 2 diabetes mellitus with other skin ulcer: Secondary | ICD-10-CM | POA: Insufficient documentation

## 2022-05-15 DIAGNOSIS — Z89611 Acquired absence of right leg above knee: Secondary | ICD-10-CM | POA: Diagnosis not present

## 2022-05-15 DIAGNOSIS — E1151 Type 2 diabetes mellitus with diabetic peripheral angiopathy without gangrene: Secondary | ICD-10-CM | POA: Insufficient documentation

## 2022-05-15 DIAGNOSIS — Z992 Dependence on renal dialysis: Secondary | ICD-10-CM | POA: Diagnosis not present

## 2022-05-15 DIAGNOSIS — E1122 Type 2 diabetes mellitus with diabetic chronic kidney disease: Secondary | ICD-10-CM | POA: Diagnosis not present

## 2022-05-15 DIAGNOSIS — Z794 Long term (current) use of insulin: Secondary | ICD-10-CM | POA: Insufficient documentation

## 2022-05-15 DIAGNOSIS — L97825 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 without evidence of necrosis: Secondary | ICD-10-CM | POA: Diagnosis not present

## 2022-05-15 LAB — CULTURE, BLOOD (ROUTINE X 2)
Culture: NO GROWTH
Culture: NO GROWTH
Special Requests: ADEQUATE

## 2022-05-15 NOTE — Progress Notes (Addendum)
Edward [name] ([id number]) Visit Report for 05/15/2022 Chief Complaint Document Details Patient Name: Date of Service: Edward [name] MES L. 05/15/2022 9:00 A M Medical Record Number: [id number] Patient Account Number: [id number] Date of Birth/Sex: Treating RN: 1971/03/19 (51 y.o. Edward [name] Primary Care Provider: [name] Other Clinician: Referring Provider: Treating Provider/Extender: Kevan Ny in Treatment: 0 Information Obtained from: Patient Chief Complaint Patients presents for treatment of an open diabetic ulcer Electronic Signature(s) Signed: 05/15/2022 11:53:44 AM By: Fredirick Maudlin MD FACS Entered By: Fredirick Maudlin on 05/15/2022 11:53:44 -------------------------------------------------------------------------------- Debridement Details Patient Name: Date of Service: Edward [name], Edward MES L. 05/15/2022 9:00 A M Medical Record Number: [id number] Patient Account Number: [id number] Date of Birth/Sex: Treating RN: 1971/03/02 (51 y.o. Edward [name] Primary Care Provider: [name] Other Clinician: Referring Provider: Treating Provider/Extender: Kevan Ny in Treatment: 0 Debridement Performed for Assessment: Wound #11 Left,Anterior Lower Leg Performed By: Physician Fredirick Maudlin, MD Debridement Type: Debridement Severity of Tissue Pre Debridement: Muscle involvement without necrosis Level of Consciousness (Pre-procedure): Awake and Alert Pre-procedure Verification/Time Out Yes - 09:59 Taken: Start Time: 09:59 Pain Control: Lidocaine 5% topical ointment T Area Debrided (L x W): otal 4.7 (cm) x 3 (cm) = 14.1 (cm) Tissue and other material debrided: Non-Viable, Slough, Subcutaneous, Slough Level: Skin/Subcutaneous Tissue Debridement Description: Excisional Instrument: Curette Bleeding: Minimum Hemostasis Achieved: Pressure Procedural Pain: 0 Post Procedural Pain: 0 Response to Treatment:  Procedure was tolerated well Level of Consciousness (Post- Awake and Alert procedure): Post Debridement Measurements of Total Wound Length: (cm) 4.7 Width: (cm) 3 Depth: (cm) 0.1 Volume: (cm) 1.107 Character of Wound/Ulcer Post Debridement: Improved Severity of Tissue Post Debridement: Muscle involvement without necrosis Post Procedure Diagnosis Same as Pre-procedure Electronic Signature(s) Signed: 05/15/2022 12:22:25 PM By: Fredirick Maudlin MD FACS Signed: 05/15/2022 2:21:05 PM By: Sharyn Creamer RN, BSN Entered By: Sharyn Creamer on 05/15/2022 10:01:19 -------------------------------------------------------------------------------- HPI Details Patient Name: Date of Service: Edward [name], Edward MES L. 05/15/2022 9:00 A M Medical Record Number: [id number] Patient Account Number: [id number] Date of Birth/Sex: Treating RN: 1970/12/31 (51 y.o. Edward [name] Primary Care Provider: [name] Other Clinician: Referring Provider: Treating Provider/Extender: Kevan Ny in Treatment: 0 History of Present Illness HPI Description: Admission 09/24/2021 Edward [name] is a 51 year old male with a past medical history of foreign years gangrene, type 2 diabetes, end-stage renal disease on hemodialysis, right BKA, left transmetatarsal amputation that presents to the clinic for 3 wounds. He developed cellulitis to the left fourth finger and was treated with doxycycline at urgent care. The nail is coming off. He reports taking the antibiotics currently. He has been keeping the area covered. He also presents with 2 left lower extremity wounds. He reports that the more proximal wound has been present for several months and the more distal wound has developed more recently over the past several weeks. He had an abdominal aortogram to the left lower extremity with laser atherectomy to the left popliteal, left tibioperoneal trunk and left peroneal arteries, drug-coated balloon  angioplasty of the left popliteal artery and plain balloon angioplasty of the left tibioperoneal trunk and left peroneal artery. He has not followed up with vein and vascular since his procedure on 04/09/2021. He currently denies signs of infection. He currently denies pain. 11/28/2021: The patient has not been seen since his initial admission. He has not followed up with vascular surgery, either. He did undergo amputation of his  left fourth finger and is currently under the care of orthopedics and occupational therapy for this. He presents today with multiple wounds on his bilateral lower extremities. He is very fluid overloaded, based upon the appearance of his extremities. These all seem to have started as blisters that have subsequently opened and caused ulcers. He does have a shrinker sock for his right BKA, but he is unable to wear it secondary to pain. He also has a compression stocking for his left lower extremity but he has been unable to apply it due to the amputation on his left hand. The largest and most painful of these wounds is just above the knee on the right. All of the wounds have eschar and some slough present. No overt signs of infection. 12/05/2021: All of the wounds look about the same. He has new blisters that have opened up revealing denuded skin underneath. The eschar is extremely hard and where there is not eschar, the wounds are gritty. He was unable to tolerate the Ace wrap at the tightness that we applied it last week and ended up having to have it loosened. He has been tolerating Kerlix and Coban on the left leg. He has not received a follow-up appointment with vascular surgery yet. We have been using Santyl under Hydrofera Blue. 12/12/2021: We have been unable to secure home health care assistance. The patient and his son are doing his wound care at home. He does have a follow-up visit in vascular surgery on May 24. He appears to be less fluid overloaded today. The wound on  his right medial thigh looks bigger today and has a thick layer of hard dark eschar. The wound on his lateral right stump looks somewhat improved with softer eschar and slough. There is also slough on the wound overlying the end of the stump and wrapping around to the backside of it. On his left leg, the edema control is improved. The wound on his anterior tibial surface is small and clean. On his dorsal and medial ankle, there is slough buildup. 12/19/2021: The wound on his left anterior tibial surface is closed. The wound on his dorsal left foot looks about the same with just a bit of slough buildup. The surface is gritty. The wound on his right medial thigh looks about the same today and has reaccumulated the hard dark eschar, despite vigorous debridement last week. The wound on the right lateral stump is about the same and has less eschar and slough. Overlying the end of the stump and wrapping around the backside of it, the wounds have converged. Overall, his edema control is much better than it has been and certainly than it was on the first visit. 12/26/2021: The wound on his dorsal left foot has a bit of slough accumulation but the surface is less gritty. The wound on his left medial calf is clean with a good bit of granulation tissue. The wounds on his right thigh and stump continue to display a heavy thick layer of black eschar. 01/01/2022: The wound on his dorsal left foot is fairly clean but still has a gritty surface. The wound on his left medial calf is nearly closed with just a little bit of overlying eschar. Unfortunately, the wounds on his right thigh and stump have deteriorated. 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 He has a fluctuant area near the proximal part of his medial thigh wound. There is an odor coming from  that leg but I am not sure which of the wounds is producing it or if both of them are. READMISSION 05/15/2022 Since he  was last seen in our clinic, the patient has undergone multiple amputations of various fingers as well as revision of his right BKA to an above-knee amputation. He is now back for further evaluation and management of 3 small wounds on his left lower leg, just above the ankle on the anterior tibial surface. They are very fibrotic and dry. Muscle fibers are visible in the most proximal wound. There is slough and leathery eschar accumulation present. Electronic Signature(s) Signed: 05/15/2022 11:56:56 AM By: Fredirick Maudlin MD FACS Entered By: Fredirick Maudlin on 05/15/2022 11:56:55 -------------------------------------------------------------------------------- Physical Exam Details Patient Name: Date of Service: Edward [name], Edward MES L. 05/15/2022 9:00 A M Medical Record Number: [id number] Patient Account Number: [id number] Date of Birth/Sex: Treating RN: May 22, 1971 (51 y.o. Edward [name] Primary Care Provider: [name] Other Clinician: Referring Provider: Treating Provider/Extender: Cecile Hearing Weeks in Treatment: 0 Constitutional . . . . No acute distress. Respiratory Normal work of breathing on room air.. Notes 05/15/2022: There are 3 small wounds on his left lower leg, just above the ankle on the anterior tibial surface. They are very fibrotic and dry. Muscle fibers are visible in the most proximal wound. There is slough and leathery eschar accumulation present. Electronic Signature(s) Signed: 05/15/2022 11:57:32 AM By: Fredirick Maudlin MD FACS Entered By: Fredirick Maudlin on 05/15/2022 11:57:31 -------------------------------------------------------------------------------- Physician Orders Details Patient Name: Date of Service: Edward [name], Edward MES L. 05/15/2022 9:00 A M Medical Record Number: [id number] Patient Account Number: [id number] Date of Birth/Sex: Treating RN: 1971-02-14 (51 y.o. 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 - Dr. Celine Ahr Rm 1 Return A Anesthetic Wound #11 Left,Anterior Lower Leg (In clinic) Topical Lidocaine 5% applied to wound bed Bathing/ Shower/ Hygiene May shower and wash wound with soap and water. - use antibacterial soap to wash legs and wounds when changing dressing. right leg Edema Control - Lymphedema / SCD / Other Elevate legs to the level of the heart or above for 30 minutes daily and/or when sitting, a frequency of: - throughout the day Avoid standing for long periods of time. Additional Orders / Instructions Follow Nutritious Diet - Monitor/Control Blood Sugars Wound Treatment Wound #11 - Lower Leg Wound Laterality: Left, Anterior Cleanser: Soap and Water Every Other Day/15 Days Discharge Instructions: May shower and wash wound with dial antibacterial soap and water prior to dressing change. Peri-Wound Care: Sween Lotion (Moisturizing lotion) Every Other Day/15 Days Discharge Instructions: Apply moisturizing lotion as directed Prim Dressing: IODOFLEX 0.9% Cadexomer Iodine Pad 4x6 cm (DME) (Generic) Every Other Day/15 Days ary Discharge Instructions: Apply to wound bed as instructed Secondary Dressing: ABD Pad, 5x9 (DME) (Generic) Every Other Day/15 Days Discharge Instructions: Apply over primary  dressing as directed. Secondary Dressing: Woven Gauze Sponge, Non-Sterile 4x4 in (DME) (Generic)  Every Other Day/15 Days Discharge Instructions: Apply over primary dressing as directed. Secured With: Coban Self-Adherent Wrap 4x5 (in/yd) (DME) (Generic) Every Other Day/15 Days Discharge Instructions: Secure with Coban as directed. Secured With: [employer], 4.5x3.1 (in/yd) (DME) (Generic) Every Other Day/15 Days Discharge Instructions: Secure with Kerlix as directed. Secured With: 30M Medipore H Soft Cloth Surgical T ape, 4 x 10 (in/yd) (DME) (Generic) Every Other Day/15 Days Discharge Instructions: Secure with tape as directed. Patient Medications llergies: Crestor, Dilaudid, Benadryl A Notifications Medication Indication Start End prior to debridement 05/15/2022 lidocaine DOSE topical 5 % ointment - ointment topical Electronic Signature(s) Signed: 05/15/2022 12:22:25 PM By: Fredirick Maudlin MD FACS Entered By: Fredirick Maudlin on 05/15/2022 11:57:51 -------------------------------------------------------------------------------- Problem List Details Patient Name: Date of Service: Edward [name], Edward MES L. 05/15/2022 9:00 A M Medical Record Number: [id number] Patient Account Number: [id number] Date of Birth/Sex: Treating RN: 17-Feb-1971 (51 y.o. Ulyses Amor, Vaughan Basta Primary Care Provider: [name] Other Clinician: Referring Provider: Treating Provider/Extender: Kevan Ny in Treatment: 0 Active Problems ICD-10 Encounter Code Description Active Date MDM Diagnosis [id number] 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 05/15/2022 No Yes involvement without evidence of necrosis N18.6 End stage renal disease 05/15/2022 No Yes Z89.611 Acquired absence of right leg above knee 05/15/2022 No Yes Z89.432 Acquired absence of left foot 05/15/2022 No Yes I50.42 Chronic combined systolic (congestive) and diastolic (congestive) heart failure  05/15/2022 No Yes I73.9 Peripheral vascular disease, unspecified 05/15/2022 No Yes E11.52 Type 2 diabetes mellitus with diabetic peripheral angiopathy with gangrene 05/15/2022 No Yes Inactive Problems Resolved Problems Electronic Signature(s) Signed: 05/15/2022 11:52:43 AM By: Fredirick Maudlin MD FACS Entered By: Fredirick Maudlin on 05/15/2022 11:52:43 -------------------------------------------------------------------------------- Progress Note Details Patient Name: Date of Service: Edward [name], Edward MES L. 05/15/2022 9:00 A M Medical Record Number: [id number] Patient Account Number: [id number] Date of Birth/Sex: Treating RN: Oct 03, 1970 (51 y.o. Edward [name] Primary Care Provider: [name] Other Clinician: Referring Provider: Treating Provider/Extender: Kevan Ny in Treatment: 0 Subjective Chief Complaint Information obtained from Patient Patients presents for treatment of an open diabetic ulcer History of Present Illness (HPI) Admission 09/24/2021 Mr. Daelyn Mozer is a 51 year old male with a past medical history of foreign years gangrene, type 2 diabetes, end-stage renal disease on hemodialysis, right BKA, left transmetatarsal amputation that presents to the clinic for 3 wounds. He developed cellulitis to the left fourth finger and was treated with doxycycline at urgent care. The nail is coming off. He reports taking the antibiotics currently. He has been keeping the area covered. He also presents with 2 left lower extremity wounds. He reports that the more proximal wound has been present for several months and the more distal wound has developed more recently over the past several weeks. He had an abdominal aortogram to the left lower extremity with laser atherectomy to the left popliteal, left tibioperoneal trunk and left peroneal arteries, drug-coated balloon angioplasty of the left popliteal artery and plain balloon angioplasty of the left tibioperoneal  trunk and left peroneal artery. He has not followed up with vein and vascular since his procedure on 04/09/2021. He currently denies signs of infection. He currently denies pain. 11/28/2021: The patient has not been seen since his initial admission. He has not followed up with vascular surgery, either. He did undergo amputation of his left fourth finger and is currently under the care of orthopedics and occupational therapy for this. He presents today  with multiple wounds on his bilateral lower extremities. He is very fluid overloaded, based upon the appearance of his extremities. These all seem to have started as blisters that have subsequently opened and caused ulcers. He does have a shrinker sock for his right BKA, but he is unable to wear it secondary to pain. He also has a compression stocking for his left lower extremity but he has been unable to apply it due to the amputation on his left hand. The largest and most painful of these wounds is just above the knee on the right. All of the wounds have eschar and some slough present. No overt signs of infection. 12/05/2021: All of the wounds look about the same. He has new blisters that have opened up revealing denuded skin underneath. The eschar is extremely hard and where there is not eschar, the wounds are gritty. He was unable to tolerate the Ace wrap at the tightness that we applied it last week and ended up having to have it loosened. He has been tolerating Kerlix and Coban on the left leg. He has not received a follow-up appointment with vascular surgery yet. We have been using Santyl under Hydrofera Blue. 12/12/2021: We have been unable to secure home health care assistance. The patient and his son are doing his wound care at home. He does have a follow-up visit in vascular surgery on May 24. He appears to be less fluid overloaded today. The wound on his right medial thigh looks bigger today and has a thick layer of hard dark eschar. The wound on  his lateral right stump looks somewhat improved with softer eschar and slough. There is also slough on the wound overlying the end of the stump and wrapping around to the backside of it. On his left leg, the edema control is improved. The wound on his anterior tibial surface is small and clean. On his dorsal and medial ankle, there is slough buildup. 12/19/2021: The wound on his left anterior tibial surface is closed. The wound on his dorsal left foot looks about the same with just a bit of slough buildup. The surface is gritty. The wound on his right medial thigh looks about the same today and has reaccumulated the hard dark eschar, despite vigorous debridement last week. The wound on the right lateral stump is about the same and has less eschar and slough. Overlying the end of the stump and wrapping around the backside of it, the wounds have converged. Overall, his edema control is much better than it has been and certainly than it was on the first visit. 12/26/2021: The wound on his dorsal left foot has a bit of slough accumulation but the surface is less gritty. The wound on his left medial calf is clean with a good bit of granulation tissue. The wounds on his right thigh and stump continue to display a heavy thick layer of black eschar. 01/01/2022: The wound on his dorsal left foot is fairly clean but still has a gritty surface. The wound on his left medial calf is nearly closed with just a little bit of overlying eschar. Unfortunately, the wounds on his right thigh and stump have deteriorated. 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 He has a fluctuant area near the proximal part of his medial thigh wound. There is an odor coming from that leg but I am not sure which of the wounds is producing it or if both of them  are. READMISSION 05/15/2022 Since he was last seen in our clinic, the patient has undergone multiple amputations of various fingers as  well as revision of his right BKA to an above-knee amputation. He is now back for further evaluation and management of 3 small wounds on his left lower leg, just above the ankle on the anterior tibial surface. They are very fibrotic and dry. Muscle fibers are visible in the most proximal wound. There is slough and leathery eschar accumulation present. Patient History Information obtained from Patient, Chart. Allergies Crestor (Reaction: Rash), Dilaudid (Reaction: itching), Benadryl (Reaction: rash) Family History Cancer - Mother, Diabetes - Siblings, Heart Disease - Mother,Father, Kidney Disease - Siblings,Father, No family history of Hereditary Spherocytosis, Hypertension, Lung Disease, Seizures, Stroke, Thyroid Problems, Tuberculosis. Social History Former smoker - Quit 2 years ago, Marital Status - Widowed, Alcohol Use - Rarely, Drug Use - No History, Caffeine Use - Moderate - coffee. Medical History Eyes Patient has history of Cataracts - Surgery Denies history of Glaucoma, Optic Neuritis Hematologic/Lymphatic Patient has history of Anemia Respiratory Patient has history of Sleep Apnea Cardiovascular Patient has history of Arrhythmia - junctional rhythm, Congestive Heart Failure, Coronary Artery Disease, Hypertension, Myocardial Infarction, Peripheral Venous Disease Endocrine Patient has history of Type II Diabetes Denies history of Type I Diabetes Genitourinary Patient has history of End Stage Renal Disease - Dialysis Integumentary (Skin) Denies history of History of Burn Musculoskeletal Patient has history of Osteoarthritis, Osteomyelitis Denies history of Gout, Rheumatoid Arthritis Neurologic Patient has history of Neuropathy Oncologic Denies history of Received Chemotherapy, Received Radiation Psychiatric Patient has history of Confinement Anxiety Denies history of Anorexia/bulimia Hospitalization/Surgery History - amputation left index finger 11/15/21. - debridement  and closure left ring finger. - peripheral vascular atherectomy 4/62/70. - left bascillic vein transposition 12/1 21. - revision of AV fistula left 05/24/20. - AV fistula placement left 11/25/19. - coronary stent intervention 12/11 19. - right BKA 06/19/18. - total left hip arthroplasty 10/11/16. - left transmet amp 05/10/16. - amputation above knee R 01/08/22. - amputauoin left index finger 01/12/22. - amputation right index finger. Medical A Surgical History Notes nd Cardiovascular cardiomyopathy Neurologic TIA Objective Constitutional No acute distress. Vitals Time Taken: 9:28 AM, Temperature: 97.6 F, Pulse: 80 bpm, Respiratory Rate: 18 breaths/min, Blood Pressure: 126/82 mmHg. Respiratory Normal work of breathing on room air.. General Notes: 05/15/2022: There are 3 small wounds on his left lower leg, just above the ankle on the anterior tibial surface. They are very fibrotic and dry. Muscle fibers are visible in the most proximal wound. There is slough and leathery eschar accumulation present. Integumentary (Hair, Skin) Wound #11 status is Open. Original cause of wound was Gradually Appeared. The date acquired was: 12/17/2021. The wound is located on the Left,Anterior Lower Leg. The wound measures 4.7cm length x 3cm width x 0.1cm depth; 11.074cm^2 area and 1.107cm^3 volume. There is muscle and Fat Layer (Subcutaneous Tissue) exposed. There is no tunneling or undermining noted. There is a medium amount of serosanguineous drainage noted. There is small (1-33%) pink granulation within the wound bed. There is a large (67-100%) amount of necrotic tissue within the wound bed including Adherent Slough. 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 There was a Excisional Skin/Subcutaneous Tissue Debridement with a total area of 14.1 sq cm performed by Fredirick Maudlin, MD. With the following instrument(s): Curette to remove Non-Viable tissue/material. Material removed includes Subcutaneous Tissue and Slough and after achieving pain control using Lidocaine 5% topical ointment. No specimens were taken. A time out was conducted at 09:59, prior to the start of the procedure. A Minimum amount of bleeding was controlled with Pressure. The procedure was tolerated well with a pain level of 0 throughout and a pain level of 0 following the procedure. Post Debridement Measurements: 4.7cm length x 3cm width x 0.1cm depth; 1.107cm^3 volume. Character of Wound/Ulcer Post Debridement is improved. Severity of Tissue Post Debridement is: Muscle involvement without necrosis. Post procedure Diagnosis Wound #11: Same as Pre-Procedure Plan Follow-up Appointments: Return Appointment in 1 week. - Dr. Celine Ahr Rm 1 Anesthetic: Wound #11 Left,Anterior Lower Leg: (In clinic) Topical Lidocaine 5% applied to wound bed Bathing/ Shower/ Hygiene: May shower and wash wound with soap and water. - use antibacterial soap to wash legs and wounds when changing dressing. right leg Edema Control - Lymphedema / SCD / Other: Elevate legs to the level of the heart or above for 30 minutes daily and/or when sitting, a frequency of: - throughout the day Avoid standing for long periods of time. Additional Orders / Instructions: Follow Nutritious Diet - Monitor/Control Blood Sugars The following medication(s) was  prescribed: lidocaine topical 5 % ointment ointment topical for prior to debridement was prescribed at facility WOUND #11: - Lower Leg Wound Laterality: Left, Anterior Cleanser: Soap and Water Every Other Day/15 Days Discharge Instructions: May shower and wash wound with dial antibacterial soap and water prior to dressing change. Peri-Wound Care: Sween Lotion (Moisturizing lotion) Every Other Day/15 Days Discharge Instructions: Apply moisturizing lotion as directed Prim Dressing: IODOFLEX 0.9% Cadexomer Iodine Pad 4x6 cm (DME) (Generic) Every Other Day/15 Days ary Discharge Instructions: Apply to wound bed as instructed Secondary Dressing: ABD Pad, 5x9 (DME) (Generic) Every Other Day/15 Days Discharge Instructions: Apply over primary dressing as directed. Secondary Dressing: Woven Gauze Sponge, Non-Sterile 4x4 in (DME) (Generic) Every Other Day/15 Days Discharge Instructions: Apply over primary dressing as directed. Secured With: Coban Self-Adherent Wrap 4x5 (in/yd) (DME) (Generic) Every Other Day/15 Days Discharge Instructions: Secure with Coban as directed. Secured With: [employer], 4.5x3.1 (in/yd) (DME) (Generic) Every Other Day/15 Days Discharge Instructions: Secure with Kerlix as directed. Secured With: 93M Medipore H Soft Cloth Surgical T ape, 4 x 10 (in/yd) (DME) (Generic) Every Other Day/15 Days Discharge Instructions: Secure with tape as directed. 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 There are 3 small wounds on his left lower leg, just above the ankle on the anterior tibial surface. They are very fibrotic and dry. Muscle fibers are visible in the most proximal wound. There is slough and leathery eschar accumulation present. Used a curette to debride slough, eschar, and nonviable subcutaneous tissue from the wound. I think he will benefit from additional  chemical debridement, so we are going to apply Iodoflex to the wound with Kerlix and Coban wrapping. Follow-up in 1 week. Electronic Signature(s) Signed: 05/15/2022 11:59:07 AM By: Fredirick Maudlin MD FACS Entered By: Fredirick Maudlin  on 05/15/2022 11:59:07 -------------------------------------------------------------------------------- HxROS Details Patient Name: Date of Service: Edward [name] MES L. 05/15/2022 9:00 A M Medical Record Number: [id number] Patient Account Number: [id number] Date of Birth/Sex: Treating RN: 20-Oct-1970 (51 y.o. Edward [name] Primary Care Provider: [name] Other Clinician: Referring Provider: Treating Provider/Extender: Kevan Ny in Treatment: 0 Information Obtained From Patient Chart Eyes Medical History: Positive for: Cataracts - Surgery Negative for: Glaucoma; Optic Neuritis Hematologic/Lymphatic Medical History: Positive for: Anemia Respiratory Medical History: Positive for: Sleep Apnea Cardiovascular Medical History: Positive for: Arrhythmia - junctional rhythm; Congestive Heart Failure; Coronary Artery Disease; Hypertension; Myocardial Infarction; Peripheral Venous Disease Past Medical History Notes: cardiomyopathy Endocrine Medical History: Positive for: Type II Diabetes Negative for: Type I Diabetes Time with diabetes: 2004 Treated with: Insulin Blood sugar tested every day: Yes Tested : daily Genitourinary Medical History: Positive for: End Stage Renal Disease - Dialysis Integumentary (Skin) Medical History: Negative for: History of Burn Musculoskeletal Medical History: Positive for: Osteoarthritis; Osteomyelitis Negative for: Gout; Rheumatoid Arthritis Neurologic Medical History: Positive for: Neuropathy Past Medical History Notes: TIA Oncologic Medical History: Negative for: Received Chemotherapy; Received Radiation Psychiatric Medical History: Positive for: Confinement  Anxiety Negative for: Anorexia/bulimia HBO Extended History Items Eyes: Cataracts Immunizations Pneumococcal Vaccine: Received Pneumococcal Vaccination: Yes Received Pneumococcal Vaccination On or After 60th Birthday: No Implantable Devices Yes Hospitalization / Surgery History Type of Hospitalization/Surgery amputation left index finger 11/15/21 debridement and closure left ring finger peripheral vascular atherectomy 5/63/89 left bascillic vein transposition 12/1 21 revision of AV fistula left 05/24/20 AV fistula placement left 11/25/19 coronary stent intervention 12/11 19 right BKA 06/19/18 total left hip arthroplasty 10/11/16 left transmet amp 05/10/16 amputation above knee R 01/08/22 amputauoin left index finger 01/12/22 amputation right index finger Family and Social History Cancer: Yes - Mother; Diabetes: Yes - Siblings; Heart Disease: Yes - Mother,Father; Hereditary Spherocytosis: No; Hypertension: No; Kidney Disease: Yes - Siblings,Father; Lung Disease: No; Seizures: No; Stroke: No; Thyroid Problems: No; Tuberculosis: No; Former smoker - Quit 2 years ago; Marital Status - Widowed; Alcohol Use: Rarely; Drug Use: No History; Caffeine Use: Moderate - coffee; Financial Concerns: No; Food, Clothing or Shelter Needs: No; Support System Lacking: No; Transportation Concerns: No Electronic Signature(s) Signed: 05/15/2022 12:22:25 PM By: Fredirick Maudlin MD FACS Signed: 05/15/2022 2:21:05 PM By: Sharyn Creamer RN, BSN Entered By: Sharyn Creamer on 05/15/2022 09:32:31 -------------------------------------------------------------------------------- [place] Details Patient Name: Date of Service: Edward [name], [name] 05/15/2022 Medical Record Number: [id number] Patient Account Number: [id number] Date of Birth/Sex: Treating RN: 02/02/1971 (51 y.o. 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 N18.6 End stage renal disease Z89.611 Acquired absence of right leg above knee Z89.432 Acquired absence of left foot I50.42 Chronic combined systolic (congestive) and diastolic (congestive) heart failure I73.9 Peripheral vascular disease, unspecified E11.52 Type 2 diabetes mellitus with diabetic peripheral angiopathy with gangrene Facility Procedures CPT4 Code: [id number] Description: 99213 - WOUND CARE VISIT-LEV 3 EST PT Modifier: Quantity: 1 CPT4 Code: 16384536 Description: 11042 - DEB SUBQ TISSUE 20 SQ CM/< ICD-10 Diagnosis Description L97.825 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 Modifier: without evidence o Quantity: 1 f necrosis Physician Procedures : CPT4 Code Description Modifier [id number] 22482 - WC PHYS LEVEL  4 - EST PT 25 ICD-10 Diagnosis Description L97.825 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 without evidence o N18.6 End stage renal disease E11.52  Type 2 diabetes mellitus with diabetic peripheral angiopathy with gangrene I50.42 Chronic combined systolic (congestive) and diastolic (congestive) heart failure Quantity: 1 f necrosis : [id number] 11042 - WC PHYS SUBQ TISS 20 SQ CM ICD-10 Diagnosis Description L97.825 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

## 2022-05-15 NOTE — Progress Notes (Signed)
SOLACE, WENDORFF ([id number]) Visit Report for 05/15/2022 Abuse Risk Screen Details Patient Name: Date of Service: Edward [name] MES L. 05/15/2022 9:00 A M Medical Record Number: [id number] Patient Account Number: [id number] Date of Birth/Sex: Treating RN: 1971-03-28 (51 y.o. Edward [name] Primary Care Cadell Gabrielson: [name] Other Clinician: Referring Lou Loewe: Treating Elysabeth Aust/Extender: Kevan Ny in Treatment: 0 Abuse Risk Screen Items Answer ABUSE RISK SCREEN: Has anyone close to you tried to hurt or harm you recentlyo No Do you feel uncomfortable with anyone in your familyo No Has anyone forced you do things that you didnt want to doo No Electronic Signature(s) Signed: 05/15/2022 2:21:05 PM By: Sharyn Creamer RN, BSN Entered By: Sharyn Creamer on 05/15/2022 09:32:40 -------------------------------------------------------------------------------- Activities of Daily Living Details Patient Name: Date of Service: Edward [name] MES L. 05/15/2022 9:00 A M Medical Record Number: [id number] Patient Account Number: [id number] Date of Birth/Sex: Treating RN: 07/01/1971 (51 y.o. Edward [name] Primary Care Anshika Pethtel: [name] Other Clinician: Referring Abriana Saltos: Treating Darrow Barreiro/Extender: Kevan Ny in Treatment: 0 Activities of Daily Living Items Answer Activities of Daily Living (Please select one for each item) Drive Automobile Need Assistance T Medications ake Need Assistance Use T elephone Need Assistance Care for Appearance Need Assistance Use T oilet Need Assistance Bath / Shower Need Assistance Dress Self Need Assistance Feed Self Need Assistance Walk Not Able Get In / Out Bed Need Assistance Housework Need Assistance Prepare Meals Need Assistance Handle Money Need Assistance Shop for Self Not Able Electronic Signature(s) Signed: 05/15/2022 2:21:05 PM By: Sharyn Creamer RN, BSN Entered By: Sharyn Creamer  on 05/15/2022 09:33:19 -------------------------------------------------------------------------------- Education Screening Details Patient Name: Date of Service: Edward [name], Edward [name] MES L. 05/15/2022 9:00 A M Medical Record Number: [id number] Patient Account Number: [id number] Date of Birth/Sex: Treating RN: [birth year] (51 y.o. Edward [name] Primary Care Niccolo Burggraf: [name] Other Clinician: Referring Felecity Lemaster: Treating Alydia Gosser/Extender: Kevan Ny in Treatment: 0 Learning Preferences/Education Level/Primary Language Learning Preference: Explanation, Demonstration, Printed Material Preferred Language: English Cognitive Barrier Language Barrier: No Translator Needed: No Memory Deficit: No Emotional Barrier: No Cultural/Religious Beliefs Affecting Medical Care: No Physical Barrier Impaired Vision: No Impaired Hearing: No Decreased Hand dexterity: No Knowledge/Comprehension Knowledge Level: High Comprehension Level: High Ability to understand written instructions: High Ability to understand verbal instructions: High Motivation Anxiety Level: Calm Cooperation: Cooperative Education Importance: Acknowledges Need Interest in Health Problems: Asks Questions Perception: Coherent Willingness to Engage in Self-Management High Activities: Readiness to Engage in Self-Management High Activities: Electronic Signature(s) Signed: 05/15/2022 2:21:05 PM By: Sharyn Creamer RN, BSN Entered By: Sharyn Creamer on 05/15/2022 09:33:55 -------------------------------------------------------------------------------- Fall Risk Assessment Details Patient Name: Date of Service: Edward [name], Edward [name] MES L. 05/15/2022 9:00 A M Medical Record Number: [id number] Patient Account Number: [id number] Date of Birth/Sex: Treating RN: 1971/03/12 (51 y.o. Edward [name] Primary Care Makyna Niehoff: [name] Other Clinician: Referring Jovonne Wilton: Treating Nataliee Shurtz/Extender: Kevan Ny in Treatment: 0 Fall Risk Assessment Items Have you had 2 or more falls in the last 12 monthso 0 No Have you had any fall that resulted in injury in the last 12 monthso 0 No FALLS RISK SCREEN History of falling - immediate or within 3 months 0 No Secondary diagnosis (Do you have 2 or more medical diagnoseso) 15 Yes Ambulatory aid None/bed rest/wheelchair/nurse 0 Yes Crutches/cane/walker 0 No Furniture 0 No Intravenous therapy Access/Saline/Heparin Lock 0 No Gait/Transferring Normal/ bed rest/  wheelchair 0 Yes Weak (short steps with or without shuffle, stooped but able to lift head while walking, may seek 0 No support from furniture) Impaired (short steps with shuffle, may have difficulty arising from chair, head down, impaired 0 No balance) Mental Status Oriented to own ability 0 No Electronic Signature(s) Signed: 05/15/2022 2:21:05 PM By: Sharyn Creamer RN, BSN Entered By: Sharyn Creamer on 05/15/2022 09:35:08 -------------------------------------------------------------------------------- Foot Assessment Details Patient Name: Date of Service: Edward [name], Edward [name] MES L. 05/15/2022 9:00 A M Medical Record Number: [id number] Patient Account Number: [id number] Date of Birth/Sex: Treating RN: 11/24/1970 (51 y.o. Edward [name] Primary Care Nesanel Aguila: [name] Other Clinician: Referring Traniyah Hallett: Treating Jahron Hunsinger/Extender: Kevan Ny in Treatment: 0 Foot Assessment Items Site Locations + = Sensation present, - = Sensation absent, C = Callus, U = Ulcer R = Redness, W = Warmth, M = Maceration, PU = Pre-ulcerative lesion F = Fissure, S = Swelling, D = Dryness Assessment Right: Left: Other Deformity: No No Prior Foot Ulcer: No No Prior Amputation: No No Charcot Joint: No No Ambulatory Status: Gait: Electronic Signature(s) Signed: 05/15/2022 2:21:05 PM By: Sharyn Creamer RN, BSN Entered By: Sharyn Creamer on 05/15/2022  09:39:03 -------------------------------------------------------------------------------- Nutrition Risk Screening Details Patient Name: Date of Service: Edward [name], Edward [name] MES L. 05/15/2022 9:00 A M Medical Record Number: [id number] Patient Account Number: [id number] Date of Birth/Sex: Treating RN: 1971/06/17 (51 y.o. Edward [name] Primary Care Beyonca Wisz: [name] Other Clinician: Referring Sumiya Mamaril: Treating Edan Serratore/Extender: Cecile Hearing Weeks in Treatment: 0 Height (in): Weight (lbs): Body Mass Index (BMI): Nutrition Risk Screening Items Score Screening NUTRITION RISK SCREEN: I have an illness or condition that made me change the kind and/or amount of food I eat 0 No I eat fewer than two meals per day 0 No I eat few fruits and vegetables, or milk products 0 No I have three or more drinks of beer, liquor or wine almost every day 0 No I have tooth or mouth problems that make it hard for me to eat 0 No I don't always have enough money to buy the food I need 0 No I eat alone most of the time 0 No I take three or more different prescribed or over-the-counter drugs a day 1 Yes Without wanting to, I have lost or gained 10 pounds in the last six months 0 No I am not always physically able to shop, cook and/or feed myself 0 No Nutrition Protocols Good Risk Protocol Moderate Risk Protocol High Risk Proctocol Risk Level: Good Risk Score: 1 Electronic Signature(s) Signed: 05/15/2022 2:21:05 PM By: Sharyn Creamer RN, BSN Entered By: Sharyn Creamer on 05/15/2022 09:36:13

## 2022-05-16 DIAGNOSIS — R52 Pain, unspecified: Secondary | ICD-10-CM | POA: Diagnosis not present

## 2022-05-16 DIAGNOSIS — N2581 Secondary hyperparathyroidism of renal origin: Secondary | ICD-10-CM | POA: Diagnosis not present

## 2022-05-16 DIAGNOSIS — D631 Anemia in chronic kidney disease: Secondary | ICD-10-CM | POA: Diagnosis not present

## 2022-05-16 DIAGNOSIS — Z992 Dependence on renal dialysis: Secondary | ICD-10-CM | POA: Diagnosis not present

## 2022-05-16 DIAGNOSIS — D689 Coagulation defect, unspecified: Secondary | ICD-10-CM | POA: Diagnosis not present

## 2022-05-16 DIAGNOSIS — N186 End stage renal disease: Secondary | ICD-10-CM | POA: Diagnosis not present

## 2022-05-16 NOTE — Progress Notes (Signed)
DRYDEN, TAPLEY ([id number]) Visit Report for 05/15/2022 Allergy List Details Patient Name: Date of Service: Edward [name] MES L. 05/15/2022 9:00 A M Medical Record Number: [id number] Patient Account Number: [id number] Date of Birth/Sex: Treating RN: 05-27-1971 (51 y.o. [name] Primary Care Dietra Stokely: [name] Other Clinician: Referring Dionel Archey: Treating Anaka Beazer/Extender: Cecile Hearing Weeks in Treatment: 0 Allergies Active Allergies Crestor Reaction: Rash Dilaudid Reaction: itching Benadryl Reaction: rash Allergy Notes Electronic Signature(s) Signed: 05/15/2022 2:21:05 PM By: Sharyn Creamer RN, BSN Entered By: Sharyn Creamer on 05/15/2022 09:29:19 -------------------------------------------------------------------------------- Arrival Information Details Patient Name: Date of Service: Edward [name], Edward [name] MES L. 05/15/2022 9:00 A M Medical Record Number: [id number] Patient Account Number: [id number] Date of Birth/Sex: Treating RN: [birth year] (51 y.o. [name] Primary Care Intisar Claudio: [name] Other Clinician: Referring Krysia Zahradnik: Treating Dozier Berkovich/Extender: Kevan Ny in Treatment: 0 Visit Information Patient Arrived: Wheel Chair Arrival Time: 09:23 Accompanied By: son Transfer Assistance: Manual Patient Identification Verified: Yes Secondary Verification Process Completed: Yes Patient Requires Transmission-Based Precautions: No Patient Has Alerts: Yes Patient Alerts: Patient on Blood Thinner plavix History Since Last Visit Added or deleted any medications: No Any new allergies or adverse reactions: No Had a fall or experienced change in activities of daily living that may affect risk of falls: No Signs or symptoms of abuse/neglect since last visito No Hospitalized since last visit: Yes Implantable device outside of the clinic excluding cellular tissue based products placed in the center since last visit:  No Has Dressing in Place as Prescribed: Yes Pain Present Now: No Notes hospitalized 9/22 stayed overnight for observation, potassium levels high, chest pain, released next day Electronic Signature(s) Signed: 05/15/2022 2:21:05 PM By: Sharyn Creamer RN, BSN Entered By: Sharyn Creamer on 05/15/2022 09:27:34 -------------------------------------------------------------------------------- Clinic Level of Care Assessment Details Patient Name: Date of Service: Edward [name], Edward [name] MES L. 05/15/2022 9:00 A M Medical Record Number: [id number] Patient Account Number: [id number] Date of Birth/Sex: Treating RN: 28-Jun-1971 (51 y.o. [name] Primary Care Reily Treloar: [name] Other Clinician: Referring Jeyson Deshotel: Treating Damin Salido/Extender: Kevan Ny in Treatment: 0 Clinic Level of Care Assessment Items TOOL 1 Quantity Score X- 1 0 Use when EandM and Procedure is performed on INITIAL visit ASSESSMENTS - Nursing Assessment / Reassessment X- 1 20 General Physical Exam (combine w/ comprehensive assessment (listed just below) when performed on new pt. evals) X- 1 25 Comprehensive Assessment (HX, ROS, Risk Assessments, Wounds Hx, etc.) ASSESSMENTS - Wound and Skin Assessment / Reassessment X- 1 10 Dermatologic / Skin Assessment (not related to wound area) ASSESSMENTS - Ostomy and/or Continence Assessment and Care '[]'$  - 0 Incontinence Assessment and Management '[]'$  - 0 Ostomy Care Assessment and Management (repouching, etc.) PROCESS - Coordination of Care X - Simple Patient / Family Education for ongoing care 1 15 '[]'$  - 0 Complex (extensive) Patient / Family Education for ongoing care X- 1 10 Staff obtains Programmer, systems, Records, T Results / Process Orders est '[]'$  - 0 Staff telephones HHA, Nursing Homes / Clarify orders / etc '[]'$  - 0 Routine Transfer to another Facility (non-emergent condition) '[]'$  - 0 Routine Hospital Admission (non-emergent condition) X- 1 15 New  Admissions / Biomedical engineer / Ordering NPWT Apligraf, etc. , '[]'$  - 0 Emergency Hospital Admission (emergent condition) PROCESS - Special Needs '[]'$  - 0 Pediatric / Minor Patient Management '[]'$  - 0 Isolation Patient Management '[]'$  - 0 Hearing / Language / Visual special needs '[]'$  -  0 Assessment of Community assistance (transportation, D/C planning, etc.) '[]'$  - 0 Additional assistance / Altered mentation '[]'$  - 0 Support Surface(s) Assessment (bed, cushion, seat, etc.) INTERVENTIONS - Miscellaneous '[]'$  - 0 External ear exam '[]'$  - 0 Patient Transfer (multiple staff / Civil Service fast streamer / Similar devices) '[]'$  - 0 Simple Staple / Suture removal (25 or less) '[]'$  - 0 Complex Staple / Suture removal (26 or more) '[]'$  - 0 Hypo/Hyperglycemic Management (do not check if billed separately) '[]'$  - 0 Ankle / Brachial Index (ABI) - do not check if billed separately Has the patient been seen at the hospital within the last three years: Yes Total Score: 95 Level Of Care: New/Established - Level 3 Electronic Signature(s) Signed: 05/15/2022 2:21:05 PM By: Sharyn Creamer RN, BSN Entered By: Sharyn Creamer on 05/15/2022 10:40:34 -------------------------------------------------------------------------------- Encounter Discharge Information Details Patient Name: Date of Service: Edward [name], Edward [name] MES L. 05/15/2022 9:00 Kiana Record Number: [id number] Patient Account Number: [id number] Date of Birth/Sex: Treating RN: 1971/07/14 (51 y.o. [name] Primary Care Kasir Hallenbeck: [name] Other Clinician: Referring Anmol Paschen: Treating Rotunda Worden/Extender: Kevan Ny in Treatment: 0 Encounter Discharge Information Items Post Procedure Vitals Discharge Condition: Stable Temperature (F): 97.6 Ambulatory Status: Wheelchair Pulse (bpm): 80 Discharge Destination: Home Respiratory Rate (breaths/min): 18 Transportation: Private Auto Blood Pressure (mmHg): 126/82 Accompanied By:  son Schedule Follow-up Appointment: Yes Clinical Summary of Care: Patient Declined Electronic Signature(s) Signed: 05/15/2022 2:21:05 PM By: Sharyn Creamer RN, BSN Entered By: Sharyn Creamer on 05/15/2022 10:38:38 -------------------------------------------------------------------------------- Lower Extremity Assessment Details Patient Name: Date of Service: Edward [name], Edward [name] MES L. 05/15/2022 9:00 A M Medical Record Number: [id number] Patient Account Number: [id number] Date of Birth/Sex: Treating RN: Oct 28, 1970 (51 y.o. [name] Primary Care Ashland Wiseman: [name] Other Clinician: Referring Destaney Sarkis: Treating Gottlieb Zuercher/Extender: Cecile Hearing Weeks in Treatment: 0 Edema Assessment Assessed: [Left: No] [Right: No] E[Left: dema] [Right: :] Calf Left: Right: Point of Measurement: From Medial Instep 42.5 cm Ankle Left: Right: Point of Measurement: From Medial Instep 23.2 cm Vascular Assessment Pulses: Dorsalis Pedis Palpable: [Left:Yes] Electronic Signature(s) Signed: 05/15/2022 2:21:05 PM By: Sharyn Creamer RN, BSN Entered By: Sharyn Creamer on 05/15/2022 09:40:14 -------------------------------------------------------------------------------- Multi Wound Chart Details Patient Name: Date of Service: Edward [name], Edward [name] MES L. 05/15/2022 9:00 A M Medical Record Number: [id number] Patient Account Number: [id number] Date of Birth/Sex: Treating RN: 1970/08/29 (51 y.o. [name] Primary Care Aretha Levi: [name] Other Clinician: Referring Maxima Skelton: Treating Montrail Mehrer/Extender: Kevan Ny in Treatment: 0 Vital Signs Height(in): Pulse(bpm): 80 Weight(lbs): Blood Pressure(mmHg): 126/82 Body Mass Index(BMI): Temperature(F): 97.6 Respiratory Rate(breaths/min): 18 Photos: [N/A:N/A] Left, Anterior Lower Leg N/A N/A Wound Location: Gradually Appeared N/A N/A Wounding Event: Diabetic Wound/Ulcer of the Lower N/A  N/A Primary Etiology: Extremity Cataracts, Anemia, Sleep Apnea, N/A N/A Comorbid History: Arrhythmia, Congestive Heart Failure, Coronary Artery Disease, Hypertension, Myocardial Infarction, Peripheral Venous Disease, Type II Diabetes, End Stage Renal Disease, Osteoarthritis, Osteomyelitis, Neuropathy, Confinement Anxiety 12/17/2021 N/A N/A Date Acquired: 0 N/A N/A Weeks of Treatment: Open N/A N/A Wound Status: No N/A N/A Wound Recurrence: 4.7x3x0.1 N/A N/A Measurements L x W x D (cm) 11.074 N/A N/A A (cm) : rea 1.107 N/A N/A Volume (cm) : Grade 2 N/A N/A Classification: Medium N/A N/A Exudate A mount: Serosanguineous N/A N/A Exudate Type: red, brown N/A N/A Exudate Color: Small (1-33%) N/A N/A Granulation A mount: Pink N/A N/A Granulation Quality: Large (67-100%) N/A N/A Necrotic A  mount: Fat Layer (Subcutaneous Tissue): Yes N/A N/A Exposed Structures: Muscle: Yes None N/A N/A Epithelialization: Debridement - Excisional N/A N/A Debridement: Pre-procedure Verification/Time Out 09:59 N/A N/A Taken: Lidocaine 5% topical ointment N/A N/A Pain Control: Subcutaneous, Slough N/A N/A Tissue Debrided: Skin/Subcutaneous Tissue N/A N/A Level: 14.1 N/A N/A Debridement A (sq cm): rea Curette N/A N/A Instrument: Minimum N/A N/A Bleeding: Pressure N/A N/A Hemostasis A chieved: 0 N/A N/A Procedural Pain: 0 N/A N/A Post Procedural Pain: Procedure was tolerated well N/A N/A Debridement Treatment Response: 4.7x3x0.1 N/A N/A Post Debridement Measurements L x W x D (cm) 1.107 N/A N/A Post Debridement Volume: (cm) Debridement N/A N/A Procedures Performed: Treatment Notes Wound #11 (Lower Leg) Wound Laterality: Left, Anterior Cleanser Soap and Water Discharge Instruction: May shower and wash wound with dial antibacterial soap and water prior to dressing change. Peri-Wound Care Sween Lotion (Moisturizing lotion) Discharge Instruction: Apply moisturizing  lotion as directed Topical Primary Dressing IODOFLEX 0.9% Cadexomer Iodine Pad 4x6 cm Discharge Instruction: Apply to wound bed as instructed Secondary Dressing ABD Pad, 5x9 Discharge Instruction: Apply over primary dressing as directed. Woven Gauze Sponge, Non-Sterile 4x4 in Discharge Instruction: Apply over primary dressing as directed. Secured With [employer] 4x5 (in/yd) Discharge Instruction: Secure with Coban as directed. Kerlix Roll Sterile, 4.5x3.1 (in/yd) Discharge Instruction: Secure with Kerlix as directed. 79M Medipore H Soft Cloth Surgical T ape, 4 x 10 (in/yd) Discharge Instruction: Secure with tape as directed. Compression Wrap Compression Stockings Add-Ons Electronic Signature(s) Signed: 05/15/2022 11:53:36 AM By: Fredirick Maudlin MD FACS Signed: 05/16/2022 5:24:26 PM By: Baruch Gouty RN, BSN Entered By: Fredirick Maudlin on 05/15/2022 11:53:36 -------------------------------------------------------------------------------- Multi-Disciplinary Care Plan Details Patient Name: Date of Service: Edward [name], Edward [name] MES L. 05/15/2022 9:00 A M Medical Record Number: [id number] Patient Account Number: [id number] Date of Birth/Sex: Treating RN: [birth year] (51 y.o. [name] Primary Care Danyal Adorno: [name] Other Clinician: Referring Kraven Calk: Treating Kaydon Husby/Extender: Kevan Ny in Treatment: 0 Active Inactive Wound/Skin Impairment Nursing Diagnoses: Impaired tissue integrity Knowledge deficit related to ulceration/compromised skin integrity Goals: Patient/caregiver will verbalize understanding of skin care regimen Date Initiated: 05/15/2022 Target Resolution Date: 06/12/2022 Goal Status: Active Ulcer/skin breakdown will have a volume reduction of 30% by week 4 Date Initiated: 05/15/2022 Target Resolution Date: 06/12/2022 Goal Status: Active Interventions: Assess patient/caregiver ability to obtain necessary  supplies Assess patient/caregiver ability to perform ulcer/skin care regimen upon admission and as needed Assess ulceration(s) every visit Provide education on ulcer and skin care Notes: Electronic Signature(s) Signed: 05/15/2022 2:21:05 PM By: Sharyn Creamer RN, BSN Entered By: Sharyn Creamer on 05/15/2022 10:36:51 -------------------------------------------------------------------------------- Pain Assessment Details Patient Name: Date of Service: Edward [name], [name] 05/15/2022 9:00 A M Medical Record Number: [id number] Patient Account Number: [id number] Date of Birth/Sex: Treating RN: 04-30-1971 (51 y.o. [name] Primary Care Joangel Vanosdol: [name] Other Clinician: Referring Satine Hausner: Treating Anyia Gierke/Extender: Kevan Ny in Treatment: 0 Active Problems Location of Pain Severity and Description of Pain Patient Has Paino No Site Locations Pain Management and Medication Current Pain Management: Electronic Signature(s) Signed: 05/15/2022 2:21:05 PM By: Sharyn Creamer RN, BSN Entered By: Sharyn Creamer on 05/15/2022 09:43:41 -------------------------------------------------------------------------------- Patient/Caregiver Education Details Patient Name: Date of Service: Edward [name] MES L. 9/27/2023andnbsp9:00 A M Medical Record Number: [id number] Patient Account Number: [id number] Date of Birth/Gender: Treating RN: [birth year] (51 y.o. [name] Primary Care Physician: [name] Other Clinician: Referring Physician: Treating Physician/Extender: Cherre Huger  K Weeks in Treatment: 0 Education Assessment Education Provided To: Patient Education Topics Provided Wound/Skin Impairment: Methods: Explain/Verbal Responses: State content correctly Electronic Signature(s) Signed: 05/15/2022 2:21:05 PM By: Sharyn Creamer RN, BSN Entered By: Sharyn Creamer on 05/15/2022  10:37:08 -------------------------------------------------------------------------------- Wound Assessment Details Patient Name: Date of Service: Edward [name], [name] 05/15/2022 9:00 A M Medical Record Number: [id number] Patient Account Number: [id number] Date of Birth/Sex: Treating RN: 1971-07-06 (51 y.o. [name] Primary Care Dinara Lupu: [name] Other Clinician: Referring Lataja Newland: Treating Shiori Adcox/Extender: Cecile Hearing Weeks in Treatment: 0 Wound Status Wound Number: 11 Primary Diabetic Wound/Ulcer of the Lower Extremity Etiology: Wound Location: Left, Anterior Lower Leg Wound Open Wounding Event: Gradually Appeared Status: Date Acquired: 12/17/2021 Comorbid Cataracts, Anemia, Sleep Apnea, Arrhythmia, Congestive Heart Weeks Of Treatment: 0 History: Failure, Coronary Artery Disease, Hypertension, Myocardial Clustered Wound: No Infarction, Peripheral Venous Disease, Type II Diabetes, End Stage Renal Disease, Osteoarthritis, Osteomyelitis, Neuropathy, Confinement Anxiety Photos Wound Measurements Length: (cm) 4.7 Width: (cm) 3 Depth: (cm) 0.1 Area: (cm) 11.074 Volume: (cm) 1.107 % Reduction in Area: % Reduction in Volume: Epithelialization: None Tunneling: No Undermining: No Wound Description Classification: Grade 2 Exudate Amount: Medium Exudate Type: Serosanguineous Exudate Color: red, brown Foul Odor After Cleansing: No Slough/Fibrino Yes Wound Bed Granulation Amount: Small (1-33%) Exposed Structure Granulation Quality: Pink Fat Layer (Subcutaneous Tissue) Exposed: Yes Necrotic Amount: Large (67-100%) Muscle Exposed: Yes Necrotic Quality: Adherent Slough Necrosis of Muscle: No Treatment Notes Wound #11 (Lower Leg) Wound Laterality: Left, Anterior Cleanser Soap and Water Discharge Instruction: May shower and wash wound with dial antibacterial soap and water prior to dressing change. Peri-Wound Care Sween Lotion  (Moisturizing lotion) Discharge Instruction: Apply moisturizing lotion as directed Topical Primary Dressing IODOFLEX 0.9% Cadexomer Iodine Pad 4x6 cm Discharge Instruction: Apply to wound bed as instructed Secondary Dressing ABD Pad, 5x9 Discharge Instruction: Apply over primary dressing as directed. Woven Gauze Sponge, Non-Sterile 4x4 in Discharge Instruction: Apply over primary dressing as directed. Secured With [employer] 4x5 (in/yd) Discharge Instruction: Secure with Coban as directed. Kerlix Roll Sterile, 4.5x3.1 (in/yd) Discharge Instruction: Secure with Kerlix as directed. 72M Medipore H Soft Cloth Surgical T ape, 4 x 10 (in/yd) Discharge Instruction: Secure with tape as directed. Compression Wrap Compression Stockings Add-Ons Electronic Signature(s) Signed: 05/15/2022 2:21:05 PM By: Sharyn Creamer RN, BSN Entered By: Sharyn Creamer on 05/15/2022 10:03:36 -------------------------------------------------------------------------------- Vitals Details Patient Name: Date of Service: Edward [name], [name] 05/15/2022 9:00 A M Medical Record Number: [id number] Patient Account Number: [id number] Date of Birth/Sex: Treating RN: [birth year] (51 y.o. [name] Primary Care Tuvia Woodrick: [name] Other Clinician: Referring Susane Bey: Treating Asante Blanda/Extender: Cecile Hearing Weeks in Treatment: 0 Vital Signs Time Taken: 09:28 Temperature (F): 97.6 Pulse (bpm): 80 Respiratory Rate (breaths/min): 18 Blood Pressure (mmHg): 126/82 Reference Range: 80 - 120 mg / dl Electronic Signature(s) Signed: 05/15/2022 2:21:05 PM By: Sharyn Creamer RN, BSN Entered By: Sharyn Creamer on 05/15/2022 09:29:14

## 2022-05-17 ENCOUNTER — Telehealth: Payer: Self-pay | Admitting: Cardiology

## 2022-05-17 DIAGNOSIS — I998 Other disorder of circulatory system: Secondary | ICD-10-CM | POA: Diagnosis not present

## 2022-05-17 DIAGNOSIS — I96 Gangrene, not elsewhere classified: Secondary | ICD-10-CM | POA: Diagnosis not present

## 2022-05-17 DIAGNOSIS — M79645 Pain in left finger(s): Secondary | ICD-10-CM | POA: Diagnosis not present

## 2022-05-17 DIAGNOSIS — M25641 Stiffness of right hand, not elsewhere classified: Secondary | ICD-10-CM | POA: Diagnosis not present

## 2022-05-17 DIAGNOSIS — S68119D Complete traumatic metacarpophalangeal amputation of unspecified finger, subsequent encounter: Secondary | ICD-10-CM | POA: Diagnosis not present

## 2022-05-17 DIAGNOSIS — L02519 Cutaneous abscess of unspecified hand: Secondary | ICD-10-CM | POA: Diagnosis not present

## 2022-05-17 NOTE — Telephone Encounter (Signed)
   Name: Edward [name]  DOB: September 28, 1970  MRN: [id number]   Primary Cardiologist: Kirk Ruths, MD  Chart reviewed as part of pre-operative protocol coverage. Patient was contacted 05/17/2022 in reference to pre-operative risk assessment for pending surgery as outlined below.  Edward [name] was last seen on 03/29/2022 by Jory Sims, NP.  He was cleared for surgery at the time.  Since that day, Edward [name] has done well from a cardiac standpoint.  Therefore, based on ACC/AHA guidelines, the patient would be at acceptable risk for the planned procedure without further cardiovascular testing.   The patient was advised that if he develops new symptoms prior to surgery to contact our office to arrange for a follow-up visit, and he verbalized understanding.  Per office protocol, he may hold Aspirin for 5-7 days prior to procedure and Plavix for 5 days prior to procedure. Please resume Aspirin and Plavix as soon as possible postprocedure, at the discretion of the surgeon.    I will route this recommendation to the requesting party via Epic fax function and remove from pre-op pool. Please call with questions.  Lenna Sciara, NP 05/17/2022, 3:29 PM

## 2022-05-17 NOTE — Telephone Encounter (Signed)
   Pre-operative Risk Assessment    Patient Name: Edward [name]  DOB: Jan 20, 1971 MRN: [id number]      Request for Surgical Clearance    Procedure:   Right index revision amputation, right long ring and small finger amputations  Date of Surgery:  Clearance 05/23/22                                 Surgeon:  Dr. Leanora Cover Surgeon's Group or Practice Name:  [facility] Phone number:  [phone] Fax number:  [phone]   Type of Clearance Requested:   - Medical  - Pharmacy:  Hold Aspirin Plavix   Type of Anesthesia:   Axillary block with MAC   Additional requests/questions:   Caller wanted to know how many days to hold the Aspirin and Plavix  Signed, Heloise Beecham   05/17/2022, 2:59 PM

## 2022-05-18 DIAGNOSIS — D689 Coagulation defect, unspecified: Secondary | ICD-10-CM | POA: Diagnosis not present

## 2022-05-18 DIAGNOSIS — I130 Hypertensive heart and chronic kidney disease with heart failure and stage 1 through stage 4 chronic kidney disease, or unspecified chronic kidney disease: Secondary | ICD-10-CM | POA: Diagnosis not present

## 2022-05-18 DIAGNOSIS — N184 Chronic kidney disease, stage 4 (severe): Secondary | ICD-10-CM | POA: Diagnosis not present

## 2022-05-18 DIAGNOSIS — R52 Pain, unspecified: Secondary | ICD-10-CM | POA: Diagnosis not present

## 2022-05-18 DIAGNOSIS — E1122 Type 2 diabetes mellitus with diabetic chronic kidney disease: Secondary | ICD-10-CM | POA: Diagnosis not present

## 2022-05-18 DIAGNOSIS — Z992 Dependence on renal dialysis: Secondary | ICD-10-CM | POA: Diagnosis not present

## 2022-05-18 DIAGNOSIS — E1129 Type 2 diabetes mellitus with other diabetic kidney complication: Secondary | ICD-10-CM | POA: Diagnosis not present

## 2022-05-18 DIAGNOSIS — D631 Anemia in chronic kidney disease: Secondary | ICD-10-CM | POA: Diagnosis not present

## 2022-05-18 DIAGNOSIS — N2581 Secondary hyperparathyroidism of renal origin: Secondary | ICD-10-CM | POA: Diagnosis not present

## 2022-05-18 DIAGNOSIS — N186 End stage renal disease: Secondary | ICD-10-CM | POA: Diagnosis not present

## 2022-05-18 DIAGNOSIS — I5042 Chronic combined systolic (congestive) and diastolic (congestive) heart failure: Secondary | ICD-10-CM | POA: Diagnosis not present

## 2022-05-20 ENCOUNTER — Other Ambulatory Visit: Payer: Self-pay | Admitting: Orthopedic Surgery

## 2022-05-20 DIAGNOSIS — S68119D Complete traumatic metacarpophalangeal amputation of unspecified finger, subsequent encounter: Secondary | ICD-10-CM | POA: Diagnosis not present

## 2022-05-20 DIAGNOSIS — M25641 Stiffness of right hand, not elsewhere classified: Secondary | ICD-10-CM | POA: Diagnosis not present

## 2022-05-20 DIAGNOSIS — I96 Gangrene, not elsewhere classified: Secondary | ICD-10-CM | POA: Diagnosis not present

## 2022-05-20 DIAGNOSIS — I998 Other disorder of circulatory system: Secondary | ICD-10-CM | POA: Diagnosis not present

## 2022-05-20 DIAGNOSIS — R52 Pain, unspecified: Secondary | ICD-10-CM | POA: Diagnosis not present

## 2022-05-20 DIAGNOSIS — L02519 Cutaneous abscess of unspecified hand: Secondary | ICD-10-CM | POA: Diagnosis not present

## 2022-05-20 DIAGNOSIS — T148XXA Other injury of unspecified body region, initial encounter: Secondary | ICD-10-CM | POA: Diagnosis not present

## 2022-05-21 DIAGNOSIS — N2581 Secondary hyperparathyroidism of renal origin: Secondary | ICD-10-CM | POA: Diagnosis not present

## 2022-05-21 DIAGNOSIS — D689 Coagulation defect, unspecified: Secondary | ICD-10-CM | POA: Diagnosis not present

## 2022-05-21 DIAGNOSIS — L299 Pruritus, unspecified: Secondary | ICD-10-CM | POA: Diagnosis not present

## 2022-05-21 DIAGNOSIS — S81001A Unspecified open wound, right knee, initial encounter: Secondary | ICD-10-CM | POA: Diagnosis not present

## 2022-05-21 DIAGNOSIS — D631 Anemia in chronic kidney disease: Secondary | ICD-10-CM | POA: Diagnosis not present

## 2022-05-21 DIAGNOSIS — R52 Pain, unspecified: Secondary | ICD-10-CM | POA: Diagnosis not present

## 2022-05-21 DIAGNOSIS — N186 End stage renal disease: Secondary | ICD-10-CM | POA: Diagnosis not present

## 2022-05-21 DIAGNOSIS — E1129 Type 2 diabetes mellitus with other diabetic kidney complication: Secondary | ICD-10-CM | POA: Diagnosis not present

## 2022-05-21 DIAGNOSIS — S81802A Unspecified open wound, left lower leg, initial encounter: Secondary | ICD-10-CM | POA: Diagnosis not present

## 2022-05-21 DIAGNOSIS — Z992 Dependence on renal dialysis: Secondary | ICD-10-CM | POA: Diagnosis not present

## 2022-05-22 ENCOUNTER — Encounter (HOSPITAL_COMMUNITY): Payer: Self-pay | Admitting: Orthopedic Surgery

## 2022-05-22 ENCOUNTER — Other Ambulatory Visit: Payer: Self-pay

## 2022-05-22 NOTE — Progress Notes (Signed)
Anesthesia Chart Review: Same day workup  Follows with cardiology for history of CAD with prior PCI (LCX, OM, LAD) HFrEF 40-45%, mild AI, Severe MR and TR, Prior TIA, PAD with prior BKA and left transmetatarsal imitation as well as multiple amputations, hx of Mobitz I HB.  Cardiac clearance per telephone encounter 05/17/2022, "Chart reviewed as part of pre-operative protocol coverage. Patient was contacted 05/17/2022 in reference to pre-operative risk assessment for pending surgery as outlined below.  YAHSIR WICKENS was last seen on 03/29/2022 by Jory Sims, NP.  He was cleared for surgery at the time.  Since that day, CHRISTOHPER DUBE has done well from a cardiac standpoint. Therefore, based on ACC/AHA guidelines, the patient would be at acceptable risk for the planned procedure without further cardiovascular testing. The patient was advised that if he develops new symptoms prior to surgery to contact our office to arrange for a follow-up visit, and he verbalized understanding. Per office protocol, he may hold Aspirin for 5-7 days prior to procedure and Plavix for 5 days prior to procedure. Please resume Aspirin and Plavix as soon as possible postprocedure, at the discretion of the surgeon."  ESRD on HD Tuesday Thursday Saturday.  IDDM 2, last A1c 6.3 on 05/10/2022.  Patient will need day of surgery labs and evaluation.  EKG 05/11/2022: Sinus rhythm with 1st degree A-V block.  Rate 82. Left axis deviation. Anterolateral infarct , age undetermined. Nonspecific T wave abnormality.  Event monitor 01/01/2022: Sinus bradycardia, NSR, occasional PAC, brief PAT, 5 beats NSVT and occasional Mobitz 1 second degree AV block.  TTE 07/06/21 (care everywhere):   MILD LV DYSFUNCTION (See above, EF 40 to 45%) WITH MODERATE LVH    ELEVATED LA PRESSURES WITH DIASTOLIC DYSFUNCTION    MILD RV SYSTOLIC DYSFUNCTION (See above)    VALVULAR REGURGITATION: MILD AR, SEVERE MR, MILD PR, SEVERE TR    NO VALVULAR STENOSIS     TRIVIAL PERICARDIAL EFFUSION    SEVERE TR WITH SYSTOLIC FLOW REVERSAL IN HEPATIC VEIN. ERO 0.4 cm2, RV 33    mL.    SEVERE MR WITH FLOW REVERSAL IN PULMONARY VEINS. ERO 0.1 cm2, RV 20 mL.   Stress echo 09/19/2020:  1. This is a negative stress echocardiogram for ischemia.   2. This is a low risk study.     Iziah, Cates [facility] Short Stay Center/Anesthesiology Phone [phone] 05/22/2022 11:47 AM

## 2022-05-22 NOTE — Progress Notes (Signed)
PCP - Dr [name] Cardiologist - Dr Kirk Ruths  Chest x-ray - 05/10/22 EKG - 05/11/22 Stress Test - n/a ECHO - 07/06/21 CE Cardiac Cath - 07/29/18  ICD Pacemaker/Loop - n/a  Sleep Study -  Yes CPAP - does not use CPAP  Blood Thinner Instructions:  Last dose was on 05/17/22.  Aspirin Instructions: Last dose was on 05/20/22.  Diabetes THE NIGHT BEFORE SURGERY, take 70% of Novolog 70/30 Insulin PRN      THE MORNING OF SURGERY, do not take take Novolog 70/30 Insulin   If your blood sugar is less than 70 mg/dL, you will need to treat for low blood sugar: Treat a low blood sugar (less than 70 mg/dL) with  cup of clear juice (cranberry or apple), 4 glucose tablets, OR glucose gel. Recheck blood sugar in 15 minutes after treatment (to make sure it is greater than 70 mg/dL). If your blood sugar is not greater than 70 mg/dL on recheck, call [phone] for further instructions.  ERAS: Clear liquids til 11:30 Am DOS.  Anesthesia review: Yes  STOP now taking any Aspirin (unless otherwise instructed by your surgeon), Aleve, Naproxen, Ibuprofen, Motrin, Advil, Goody's, BC's, all herbal medications, fish oil, and all vitamins.   Coronavirus Screening Do you have any of the following symptoms:  Cough yes/no: No Fever (>100.81F)  yes/no: No Runny nose yes/no: No Sore throat yes/no: No Difficulty breathing/shortness of breath  yes/no: No  Have you traveled in the last 14 days and where? yes/no: No  Patient verbalized understanding of instructions that were given via phone.

## 2022-05-22 NOTE — Anesthesia Preprocedure Evaluation (Signed)
Anesthesia Evaluation  Patient identified by MRN, date of birth, ID band Patient awake    Reviewed: Allergy & Precautions, NPO status , Patient's Chart, lab work & pertinent test results  History of Anesthesia Complications Negative for: history of anesthetic complications  Airway Mallampati: II  TM Distance: >3 FB Neck ROM: Full    Dental  (+) Dental Advisory Given, Missing   Pulmonary sleep apnea (noncompliant) , former smoker,    Pulmonary exam normal        Cardiovascular hypertension (no meds), + CAD, + Past MI, + Cardiac Stents, + Peripheral Vascular Disease and +CHF  Normal cardiovascular exam+ Valvular Problems/Murmurs MR    '22 TTE - EF 40 to 45%. Moderate LVH. Mild RV systolic dysfx. Mild AI. Severe MR. Mild PR. Severe TR. Trivial pericardial effusion.     Neuro/Psych PSYCHIATRIC DISORDERS Anxiety Depression TIA   GI/Hepatic Neg liver ROS, GERD  Medicated and Controlled,  Endo/Other  diabetes, Insulin Dependent  Renal/GU ESRF and DialysisRenal disease     Musculoskeletal  (+) Arthritis ,   Abdominal   Peds  Hematology  (+) Blood dyscrasia, anemia ,  On plavix Plt 133k   Anesthesia Other Findings   Reproductive/Obstetrics                           Anesthesia Physical Anesthesia Plan  ASA: 4  Anesthesia Plan: Regional   Post-op Pain Management: Regional block*   Induction:   PONV Risk Score and Plan: 1 and Propofol infusion and Treatment may vary due to age or medical condition  Airway Management Planned: Natural Airway and Simple Face Mask  Additional Equipment: None  Intra-op Plan:   Post-operative Plan:   Informed Consent: I have reviewed the patients History and Physical, chart, labs and discussed the procedure including the risks, benefits and alternatives for the proposed anesthesia with the patient or authorized representative who has indicated his/her  understanding and acceptance.       Plan Discussed with: CRNA and Anesthesiologist  Anesthesia Plan Comments: (Waiting for surgeon in order to do block, no consent/orders yet - 13:25)     Anesthesia Quick Evaluation

## 2022-05-23 ENCOUNTER — Encounter (HOSPITAL_COMMUNITY)
Admission: RE | Disposition: A | Discharge status: Home / Self Care | Payer: Self-pay | Source: Home / Self Care | Attending: Orthopedic Surgery

## 2022-05-23 ENCOUNTER — Ambulatory Visit (HOSPITAL_BASED_OUTPATIENT_CLINIC_OR_DEPARTMENT_OTHER): Payer: Medicare Other | Admitting: Physician Assistant

## 2022-05-23 ENCOUNTER — Ambulatory Visit (HOSPITAL_BASED_OUTPATIENT_CLINIC_OR_DEPARTMENT_OTHER): Payer: Medicare Other | Admitting: General Surgery

## 2022-05-23 ENCOUNTER — Encounter (HOSPITAL_COMMUNITY): Payer: Self-pay | Admitting: Orthopedic Surgery

## 2022-05-23 ENCOUNTER — Other Ambulatory Visit: Payer: Self-pay

## 2022-05-23 ENCOUNTER — Ambulatory Visit (HOSPITAL_COMMUNITY): Payer: Medicare Other | Admitting: Physician Assistant

## 2022-05-23 ENCOUNTER — Ambulatory Visit (HOSPITAL_COMMUNITY)
Admission: RE | Admit: 2022-05-23 | Discharge: 2022-05-23 | Disposition: A | Discharge status: Home / Self Care | Payer: Medicare Other | Attending: Orthopedic Surgery | Admitting: Orthopedic Surgery

## 2022-05-23 DIAGNOSIS — Z79899 Other long term (current) drug therapy: Secondary | ICD-10-CM | POA: Diagnosis not present

## 2022-05-23 DIAGNOSIS — I132 Hypertensive heart and chronic kidney disease with heart failure and with stage 5 chronic kidney disease, or end stage renal disease: Secondary | ICD-10-CM | POA: Diagnosis not present

## 2022-05-23 DIAGNOSIS — M87844 Other osteonecrosis, right finger(s): Secondary | ICD-10-CM

## 2022-05-23 DIAGNOSIS — N186 End stage renal disease: Secondary | ICD-10-CM | POA: Insufficient documentation

## 2022-05-23 DIAGNOSIS — D631 Anemia in chronic kidney disease: Secondary | ICD-10-CM | POA: Diagnosis not present

## 2022-05-23 DIAGNOSIS — S61204A Unspecified open wound of right ring finger without damage to nail, initial encounter: Secondary | ICD-10-CM | POA: Diagnosis not present

## 2022-05-23 DIAGNOSIS — Z87891 Personal history of nicotine dependence: Secondary | ICD-10-CM | POA: Insufficient documentation

## 2022-05-23 DIAGNOSIS — G4730 Sleep apnea, unspecified: Secondary | ICD-10-CM | POA: Diagnosis not present

## 2022-05-23 DIAGNOSIS — M869 Osteomyelitis, unspecified: Secondary | ICD-10-CM | POA: Diagnosis not present

## 2022-05-23 DIAGNOSIS — I509 Heart failure, unspecified: Secondary | ICD-10-CM | POA: Diagnosis not present

## 2022-05-23 DIAGNOSIS — Y835 Amputation of limb(s) as the cause of abnormal reaction of the patient, or of later complication, without mention of misadventure at the time of the procedure: Secondary | ICD-10-CM | POA: Insufficient documentation

## 2022-05-23 DIAGNOSIS — I2510 Atherosclerotic heart disease of native coronary artery without angina pectoris: Secondary | ICD-10-CM | POA: Insufficient documentation

## 2022-05-23 DIAGNOSIS — I252 Old myocardial infarction: Secondary | ICD-10-CM | POA: Diagnosis not present

## 2022-05-23 DIAGNOSIS — L089 Local infection of the skin and subcutaneous tissue, unspecified: Secondary | ICD-10-CM | POA: Diagnosis not present

## 2022-05-23 DIAGNOSIS — E1122 Type 2 diabetes mellitus with diabetic chronic kidney disease: Secondary | ICD-10-CM | POA: Diagnosis not present

## 2022-05-23 DIAGNOSIS — K219 Gastro-esophageal reflux disease without esophagitis: Secondary | ICD-10-CM | POA: Diagnosis not present

## 2022-05-23 DIAGNOSIS — S61206A Unspecified open wound of right little finger without damage to nail, initial encounter: Secondary | ICD-10-CM | POA: Diagnosis not present

## 2022-05-23 DIAGNOSIS — D689 Coagulation defect, unspecified: Secondary | ICD-10-CM | POA: Diagnosis not present

## 2022-05-23 DIAGNOSIS — Z89021 Acquired absence of right finger(s): Secondary | ICD-10-CM | POA: Diagnosis not present

## 2022-05-23 DIAGNOSIS — Z89511 Acquired absence of right leg below knee: Secondary | ICD-10-CM | POA: Insufficient documentation

## 2022-05-23 DIAGNOSIS — M1990 Unspecified osteoarthritis, unspecified site: Secondary | ICD-10-CM | POA: Insufficient documentation

## 2022-05-23 DIAGNOSIS — M868X4 Other osteomyelitis, hand: Secondary | ICD-10-CM | POA: Diagnosis not present

## 2022-05-23 DIAGNOSIS — S61200A Unspecified open wound of right index finger without damage to nail, initial encounter: Secondary | ICD-10-CM | POA: Diagnosis not present

## 2022-05-23 DIAGNOSIS — Z8673 Personal history of transient ischemic attack (TIA), and cerebral infarction without residual deficits: Secondary | ICD-10-CM | POA: Insufficient documentation

## 2022-05-23 DIAGNOSIS — Z794 Long term (current) use of insulin: Secondary | ICD-10-CM | POA: Insufficient documentation

## 2022-05-23 DIAGNOSIS — Z833 Family history of diabetes mellitus: Secondary | ICD-10-CM | POA: Insufficient documentation

## 2022-05-23 DIAGNOSIS — Z992 Dependence on renal dialysis: Secondary | ICD-10-CM | POA: Diagnosis not present

## 2022-05-23 DIAGNOSIS — L98499 Non-pressure chronic ulcer of skin of other sites with unspecified severity: Secondary | ICD-10-CM | POA: Diagnosis not present

## 2022-05-23 DIAGNOSIS — T8789 Other complications of amputation stump: Secondary | ICD-10-CM | POA: Diagnosis not present

## 2022-05-23 DIAGNOSIS — Z955 Presence of coronary angioplasty implant and graft: Secondary | ICD-10-CM | POA: Insufficient documentation

## 2022-05-23 DIAGNOSIS — R52 Pain, unspecified: Secondary | ICD-10-CM | POA: Diagnosis not present

## 2022-05-23 DIAGNOSIS — E1152 Type 2 diabetes mellitus with diabetic peripheral angiopathy with gangrene: Secondary | ICD-10-CM | POA: Insufficient documentation

## 2022-05-23 DIAGNOSIS — S61202A Unspecified open wound of right middle finger without damage to nail, initial encounter: Secondary | ICD-10-CM | POA: Diagnosis not present

## 2022-05-23 DIAGNOSIS — L299 Pruritus, unspecified: Secondary | ICD-10-CM | POA: Diagnosis not present

## 2022-05-23 DIAGNOSIS — F419 Anxiety disorder, unspecified: Secondary | ICD-10-CM | POA: Insufficient documentation

## 2022-05-23 DIAGNOSIS — I96 Gangrene, not elsewhere classified: Secondary | ICD-10-CM | POA: Diagnosis not present

## 2022-05-23 DIAGNOSIS — N2581 Secondary hyperparathyroidism of renal origin: Secondary | ICD-10-CM | POA: Diagnosis not present

## 2022-05-23 DIAGNOSIS — E1129 Type 2 diabetes mellitus with other diabetic kidney complication: Secondary | ICD-10-CM | POA: Diagnosis not present

## 2022-05-23 HISTORY — PX: AMPUTATION: SHX166

## 2022-05-23 LAB — POCT I-STAT, CHEM 8
BUN: 44 mg/dL — ABNORMAL HIGH (ref 6–20)
Calcium, Ion: 0.96 mmol/L — ABNORMAL LOW (ref 1.15–1.40)
Chloride: 98 mmol/L (ref 98–111)
Creatinine, Ser: 5.8 mg/dL — ABNORMAL HIGH (ref 0.61–1.24)
Glucose, Bld: 93 mg/dL (ref 70–99)
HCT: 38 % — ABNORMAL LOW (ref 39.0–52.0)
Hemoglobin: 12.9 g/dL — ABNORMAL LOW (ref 13.0–17.0)
Potassium: 4.3 mmol/L (ref 3.5–5.1)
Sodium: 137 mmol/L (ref 135–145)
TCO2: 27 mmol/L (ref 22–32)

## 2022-05-23 LAB — GLUCOSE, CAPILLARY
Glucose-Capillary: 122 mg/dL — ABNORMAL HIGH (ref 70–99)
Glucose-Capillary: 104 mg/dL — ABNORMAL HIGH (ref 70–99)
Glucose-Capillary: 104 mg/dL — ABNORMAL HIGH (ref 70–99)

## 2022-05-23 SURGERY — AMPUTATION DIGIT
Anesthesia: Regional | Laterality: Right

## 2022-05-23 MED ORDER — SODIUM CHLORIDE 0.9 % IV SOLN: INTRAVENOUS | Status: DC

## 2022-05-23 MED ORDER — OXYCODONE HCL 5 MG/5ML PO SOLN: 5.0000 mg | Freq: Once | ORAL | Status: DC | PRN

## 2022-05-23 MED ORDER — OXYCODONE HCL 5 MG PO TABS: ORAL_TABLET | ORAL | 0 refills | Status: DC

## 2022-05-23 MED ORDER — MIDAZOLAM HCL 2 MG/2ML IJ SOLN
INTRAMUSCULAR | Status: AC
  Administered 2022-05-23: 1 mg via INTRAVENOUS
  Filled 2022-05-23: qty 2

## 2022-05-23 MED ORDER — PROMETHAZINE HCL 25 MG/ML IJ SOLN: 6.2500 mg | INTRAMUSCULAR | Status: DC | PRN

## 2022-05-23 MED ORDER — PROPOFOL 500 MG/50ML IV EMUL
INTRAVENOUS | Status: DC | PRN
  Administered 2022-05-23: 25 ug/kg/min via INTRAVENOUS

## 2022-05-23 MED ORDER — FENTANYL CITRATE (PF) 250 MCG/5ML IJ SOLN
INTRAMUSCULAR | Status: AC
  Filled 2022-05-23: qty 5

## 2022-05-23 MED ORDER — FENTANYL CITRATE (PF) 100 MCG/2ML IJ SOLN: 50.0000 ug | Freq: Once | INTRAMUSCULAR | Status: AC

## 2022-05-23 MED ORDER — LIDOCAINE 2% (20 MG/ML) 5 ML SYRINGE
INTRAMUSCULAR | Status: AC
  Filled 2022-05-23: qty 5

## 2022-05-23 MED ORDER — MIDAZOLAM HCL 2 MG/2ML IJ SOLN: 0.5000 mg | Freq: Once | INTRAMUSCULAR | Status: DC

## 2022-05-23 MED ORDER — CLONIDINE HCL (ANALGESIA) 100 MCG/ML EP SOLN
EPIDURAL | Status: DC | PRN
  Administered 2022-05-23: 100 ug

## 2022-05-23 MED ORDER — TRAMADOL HCL 50 MG PO TABS
50.0000 mg | ORAL_TABLET | Freq: Once | ORAL | Status: AC
  Administered 2022-05-23: 50 mg via ORAL

## 2022-05-23 MED ORDER — FENTANYL CITRATE (PF) 100 MCG/2ML IJ SOLN
INTRAMUSCULAR | Status: AC
  Filled 2022-05-23: qty 2

## 2022-05-23 MED ORDER — ONDANSETRON HCL 4 MG/2ML IJ SOLN
INTRAMUSCULAR | Status: DC | PRN
  Administered 2022-05-23: 4 mg via INTRAVENOUS

## 2022-05-23 MED ORDER — FENTANYL CITRATE (PF) 100 MCG/2ML IJ SOLN
INTRAMUSCULAR | Status: AC
  Administered 2022-05-23: 50 ug via INTRAVENOUS
  Filled 2022-05-23: qty 2

## 2022-05-23 MED ORDER — MIDAZOLAM HCL 2 MG/2ML IJ SOLN
INTRAMUSCULAR | Status: AC
  Filled 2022-05-23: qty 2

## 2022-05-23 MED ORDER — 0.9 % SODIUM CHLORIDE (POUR BTL) OPTIME
TOPICAL | Status: DC | PRN
  Administered 2022-05-23: 1000 mL

## 2022-05-23 MED ORDER — ORAL CARE MOUTH RINSE: 15.0000 mL | Freq: Once | OROMUCOSAL | Status: AC

## 2022-05-23 MED ORDER — TRAMADOL HCL 50 MG PO TABS
ORAL_TABLET | ORAL | Status: AC
  Filled 2022-05-23: qty 1

## 2022-05-23 MED ORDER — ONDANSETRON HCL 4 MG/2ML IJ SOLN
INTRAMUSCULAR | Status: AC
  Filled 2022-05-23: qty 2

## 2022-05-23 MED ORDER — BUPIVACAINE-EPINEPHRINE (PF) 0.5% -1:200000 IJ SOLN
INTRAMUSCULAR | Status: DC | PRN
  Administered 2022-05-23: 30 mL via PERINEURAL

## 2022-05-23 MED ORDER — INSULIN ASPART 100 UNIT/ML IJ SOLN: 0.0000 [IU] | INTRAMUSCULAR | Status: DC | PRN

## 2022-05-23 MED ORDER — PHENYLEPHRINE HCL-NACL 20-0.9 MG/250ML-% IV SOLN
INTRAVENOUS | Status: AC
  Filled 2022-05-23: qty 250

## 2022-05-23 MED ORDER — PROPOFOL 10 MG/ML IV BOLUS
INTRAVENOUS | Status: AC
  Filled 2022-05-23: qty 20

## 2022-05-23 MED ORDER — FENTANYL CITRATE (PF) 100 MCG/2ML IJ SOLN: 25.0000 ug | INTRAMUSCULAR | Status: DC | PRN

## 2022-05-23 MED ORDER — BUPIVACAINE HCL (PF) 0.25 % IJ SOLN
INTRAMUSCULAR | Status: AC
  Filled 2022-05-23: qty 30

## 2022-05-23 MED ORDER — CEFAZOLIN SODIUM-DEXTROSE 2-3 GM-%(50ML) IV SOLR
INTRAVENOUS | Status: DC | PRN
  Administered 2022-05-23: 2 g via INTRAVENOUS

## 2022-05-23 MED ORDER — MIDAZOLAM HCL 2 MG/2ML IJ SOLN: 1.0000 mg | Freq: Once | INTRAMUSCULAR | Status: AC

## 2022-05-23 MED ORDER — CEFAZOLIN SODIUM-DEXTROSE 2-4 GM/100ML-% IV SOLN
INTRAVENOUS | Status: AC
  Filled 2022-05-23: qty 100

## 2022-05-23 MED ORDER — OXYCODONE HCL 5 MG PO TABS: 5.0000 mg | ORAL_TABLET | Freq: Once | ORAL | Status: DC | PRN

## 2022-05-23 MED ORDER — CHLORHEXIDINE GLUCONATE 0.12 % MT SOLN
15.0000 mL | Freq: Once | OROMUCOSAL | Status: AC
  Administered 2022-05-23: 15 mL via OROMUCOSAL
  Filled 2022-05-23: qty 15

## 2022-05-23 SURGICAL SUPPLY — 33 items
BAG COUNTER SPONGE SURGICOUNT (BAG) ×2 IMPLANT
BAG SPNG CNTER NS LX DISP (BAG) ×1
BLADE LONG MED 31X9 (MISCELLANEOUS) ×2 IMPLANT
BNDG CMPR 5X2 CHSV 1 LYR STRL (GAUZE/BANDAGES/DRESSINGS) ×1
BNDG CMPR 9X4 STRL LF SNTH (GAUZE/BANDAGES/DRESSINGS) ×1
BNDG COHESIVE 1X5 TAN STRL LF (GAUZE/BANDAGES/DRESSINGS) ×1 IMPLANT
BNDG COHESIVE 2X5 TAN ST LF (GAUZE/BANDAGES/DRESSINGS) IMPLANT
BNDG ESMARK 4X9 LF (GAUZE/BANDAGES/DRESSINGS) ×1 IMPLANT
BNDG GAUZE DERMACEA FLUFF 4 (GAUZE/BANDAGES/DRESSINGS) IMPLANT
BNDG GZE DERMACEA 4 6PLY (GAUZE/BANDAGES/DRESSINGS)
CORD BIPOLAR FORCEPS 12FT (ELECTRODE) ×1 IMPLANT
CUFF TOURN SGL QUICK 18X4 (TOURNIQUET CUFF) ×2 IMPLANT
CUFF TOURN SGL QUICK 24 (TOURNIQUET CUFF)
CUFF TRNQT CYL 24X4X16.5-23 (TOURNIQUET CUFF) IMPLANT
DRSG CURAD 3X16 NADH (PACKING) IMPLANT
GAUZE PACKING IODOFORM 1/4X15 (PACKING) IMPLANT
GAUZE SPONGE 4X4 12PLY STRL (GAUZE/BANDAGES/DRESSINGS) IMPLANT
GAUZE XEROFORM 1X8 LF (GAUZE/BANDAGES/DRESSINGS) ×2 IMPLANT
GLOVE BIO SURGEON STRL SZ7.5 (GLOVE) ×1 IMPLANT
GLOVE BIOGEL PI IND STRL 8 (GLOVE) ×1 IMPLANT
GOWN STRL REUS W/ TWL LRG LVL3 (GOWN DISPOSABLE) ×1 IMPLANT
GOWN STRL REUS W/ TWL XL LVL3 (GOWN DISPOSABLE) ×2 IMPLANT
GOWN STRL REUS W/TWL LRG LVL3 (GOWN DISPOSABLE) ×1
GOWN STRL REUS W/TWL XL LVL3 (GOWN DISPOSABLE) ×1
KIT BASIN OR (CUSTOM PROCEDURE TRAY) ×1 IMPLANT
KIT TURNOVER KIT B (KITS) ×1 IMPLANT
NS IRRIG 1000ML POUR BTL (IV SOLUTION) ×1 IMPLANT
PACK ORTHO EXTREMITY (CUSTOM PROCEDURE TRAY) ×2 IMPLANT
SPECIMEN JAR SMALL (MISCELLANEOUS) ×1 IMPLANT
SUT MON AB 5-0 PS2 18 (SUTURE) IMPLANT
SYR CONTROL 10ML LL (SYRINGE) IMPLANT
TOWEL GREEN STERILE (TOWEL DISPOSABLE) ×2 IMPLANT
UNDERPAD 30X36 HEAVY ABSORB (UNDERPADS AND DIAPERS) ×2 IMPLANT

## 2022-05-23 NOTE — Transfer of Care (Signed)
Immediate Anesthesia Transfer of Care Note  Patient: Edward [name]  Procedure(s) Performed: RIGHT INDEX FINGER RAY AMPUTATION; RIGHT LONG, RING, AND SMALL FINGER AMPUTATIONS (Right)  Patient Location: PACU  Anesthesia Type:MAC combined with regional for post-op pain  Level of Consciousness: drowsy and patient cooperative  Airway & Oxygen Therapy: Patient Spontanous Breathing and Patient connected to face mask oxygen  Post-op Assessment: Report given to RN and Post -op Vital signs reviewed and stable  Post vital signs: Reviewed and stable  Last Vitals:  Vitals Value Taken Time  BP 105/70 05/23/22 1638  Temp    Pulse 84 05/23/22 1644  Resp 13 05/23/22 1644  SpO2 100 % 05/23/22 1644  Vitals shown include unvalidated device data.  Last Pain:  Vitals:   05/23/22 1233  TempSrc: Oral         Complications: No notable events documented.

## 2022-05-23 NOTE — Op Note (Signed)
I assisted Surgeon(s) and Role:    * Leanora Cover, MD - Primary    * Daryll Brod, MD - Assisting on the Procedure(s): RIGHT INDEX FINGER RAY AMPUTATION; RIGHT LONG, RING, AND SMALL FINGER AMPUTATIONS on 05/23/2022.  I provided assistance on this case as follows: Set up, approach, retraction for removal of metacarpal index finger, approach, amputation of the ring finger, approach retraction for disarticulation of middle finger at the metacarpal phalangeal joint, approach retraction for disarticulation small finger metacarpal phalangeal joint packing and closure of wounds and application of dressing  Electronically signed by: Daryll Brod, MD Date: 05/23/2022 Time: 4:32 PM

## 2022-05-23 NOTE — Progress Notes (Signed)
Patients blood pressure low but patient alert awake responsive to questions appropriately. Dr. Daiva Huge Anestiosologist notified and order 500 ml of bolus and call back with blood pressure. After bolus provided patients blood pressure still remain low but patient continue to say he was fine, Dr. Daiva Huge stated that if he is fine she is not worried about the blood pressure and he can go. Patient discharged home, blood pressure better before headed out. Picked up by two sons. Patient awake, alert, appropriate. No pain.

## 2022-05-23 NOTE — Progress Notes (Signed)
Spoke with Edward [name] in Dr. Levell July offic e. She will text Dr. Fredna Dow and notify him that he needs to approve orders so pt can sign consent. Orders are in second sign.

## 2022-05-23 NOTE — Op Note (Signed)
NAME: GENEVA PALLAS MEDICAL RECORD NO: [id number] DATE OF BIRTH: [birth year] FACILITY: [facility] LOCATION: [facility] OR PHYSICIAN: Tennis Must, MD   OPERATIVE REPORT   DATE OF PROCEDURE: 05/23/22    PREOPERATIVE DIAGNOSIS: Right index , long, and small finger nonhealing wounds, ring finger necrosis   POSTOPERATIVE DIAGNOSIS: Right index, long, and small finger nonhealing wounds, ring finger necrosis   PROCEDURE: 1.  Right index finger ray resection 2.  Right long finger amputation through MP joint 3.  Right ring finger amputation through PIP joint 4.  Right small finger amputation through MP joint   SURGEON:  Leanora Cover, M.D.   ASSISTANT: Daryll Brod, MD   ANESTHESIA:  Regional with sedation   INTRAVENOUS FLUIDS:  Per anesthesia flow sheet.   ESTIMATED BLOOD LOSS:  Minimal.   COMPLICATIONS:  None.   SPECIMENS: Cultures to micro, amputated digits to pathology for gross exam only   TOURNIQUET TIME:    Total Tourniquet Time Documented: Forearm (Right) - -65 minutes Total: Forearm (Right) - -65 minutes    DISPOSITION:  Stable to PACU.   INDICATIONS: 51 year old male on dialysis.  He has had amputation of the index finger at the MP joint and the long and small fingers through the proximal phalanx.  He has nonhealing wounds of each of these digits as well as necrosis of the ring finger.  He wishes to proceed with amputations of the index long ring and small fingers.  The index finger will be a ray amputation.  Risks, benefits and alternatives of surgery were discussed including the risks of blood loss, infection, damage to nerves, vessels, tendons, ligaments, bone for surgery, need for additional surgery, complications with wound healing, continued pain, stiffness, , need for repeat irrigation and debridement.  He voiced understanding of these risks and elected to proceed.  OPERATIVE COURSE:  After being identified preoperatively by myself,  the patient and I agreed on the  procedure and site of the procedure.  The surgical site was marked.  Surgical consent had been signed. Preoperative IV antibiotic prophylaxis was given. He was transferred to the operating room and placed on the operating table in supine position with the Right upper extremity on an arm board.  Sedation was induced by the anesthesiologist. A regional block had been performed by anesthesia in preoperative holding.    Right upper extremity was prepped and draped in normal sterile orthopedic fashion.  A surgical pause was performed between the surgeons, anesthesia, and operating room staff and all were in agreement as to the patient, procedure, and site of procedure.  Tourniquet at the proximal aspect of the forearm was inflated to 250 mmHg after exsanguination of the arm with an Esmarch bandage.  The wound of the index finger was explored.  There was gross purulence.  The wound was debrided of any infected or devitalized tissue.  The index finger metacarpal was amputated proximal to the head so that it would be below the skin edge.  The bone cutters were used.  The volar wound was healing with granulation tissue.  The wound was copiously irrigated with sterile saline.  The ring finger was then addressed.  A fishmouth style incision was made at the level of the PIP joint.  This was carried in subcutaneous tissues by spreading technique.  Bipolar electrocautery was used to bipolared the radial and ulnar digital nerves and allow them to retract proximally.  The radial and ulnar digital arteries were bipolared and allowed to retract.  The finger  was amputated through the PIP joint.  It was sent to pathology for gross exam only.  The index finger metacarpal had been sent for gross exam only as well.  The wound was copiously irrigated with sterile saline and closed with 5-0 Monocryl in a interrupted fashion.  Good tension-free apposition of the skin edges was obtained.  The long finger was then addressed.  A fishmouth  incision was made at the MP joint.  This was carried in subcutaneous tissues.  There was gross purulence in the posterior tissues.  Cultures were taken for aerobes and anaerobes.  Devitalized and infected tissues were removed sharply with a knife.  The wound was extended proximally dorsally and to the long finger amputated through the PIP joint.  It was sent to pathology for gross exam only.  The wound was irrigated with sterile saline.  It was partially closed with 5-0 Monocryl suture allowing drainage areas on both the radial and ulnar sides.  The small finger was then addressed.  Again a fishmouth style incision was made at the level of the MP joint.  This was extended proximally dorsally.  There was no gross purulence in the small finger.  The radial and ulnar digital nerve and artery were identified and bipolared.  The finger was amputated through the MP joint sharply with a knife.  The wound was copiously irrigated with sterile saline.  The skin edges were shortened as necessary and closed with 5-0 Monocryl suture in an interrupted fashion.  Good tension-free reapposition was obtained.  The skin edges had been shortened in the long finger as well.  Cordage iodoform packing was placed in the index finger and long finger wounds.  The ring and small finger wounds were dressed with sterile Xeroform.  All wounds were dressed with sterile 4 x 4's and wrapped with Coban dressing lightly.  The tourniquet was deflated at 65 minutes.  Fingertips were pink with brisk capillary refill after deflation of tourniquet.  The operative  drapes were broken down.  The patient was awoken from anesthesia safely.  He was transferred back to the stretcher and taken to PACU in stable condition.  I will see him back in the office in 3-4 days for postoperative followup.  I will give him a prescription for oxycodone 5 mg 1 p.o. every 6 hours as needed pain dispense #20.   Leanora Cover, MD Electronically signed, 05/23/22

## 2022-05-23 NOTE — Anesthesia Procedure Notes (Signed)
Anesthesia Regional Block: Supraclavicular block   Pre-Anesthetic Checklist: , timeout performed,  Correct Patient, Correct Site, Correct Laterality,  Correct Procedure, Correct Position, site marked,  Risks and benefits discussed,  Pre-op evaluation,  At surgeon's request and post-op pain management  Laterality: Right  Prep: Maximum Sterile Barrier Precautions used, chloraprep       Needles:  Injection technique: Single-shot  Needle Type: Echogenic Stimulator Needle     Needle Length: 9cm  Needle Gauge: 22     Additional Needles:   Procedures:,,,, ultrasound used (permanent image in chart),,    Narrative:  Start time: 05/23/2022 2:43 PM End time: 05/23/2022 2:46 PM Injection made incrementally with aspirations every 5 mL.  Performed by: Personally  Anesthesiologist: Brennan Bailey, MD  Additional Notes: Risks, benefits, and alternative discussed. Patient gave consent for procedure. Patient prepped and draped in sterile fashion. Sedation administered, patient remains easily responsive to voice. Relevant anatomy identified with ultrasound guidance. Local anesthetic given in 5cc increments with no signs or symptoms of intravascular injection. No pain or paraesthesias with injection. Patient monitored throughout procedure with signs of LAST or immediate complications. Tolerated well. Ultrasound image placed in chart.  Tawny Asal, MD

## 2022-05-23 NOTE — Anesthesia Postprocedure Evaluation (Signed)
Anesthesia Post Note  Patient: Edward [name]  Procedure(s) Performed: RIGHT INDEX FINGER RAY AMPUTATION; RIGHT LONG, RING, AND SMALL FINGER AMPUTATIONS (Right)     Patient location during evaluation: PACU Anesthesia Type: Regional Level of consciousness: awake and alert Pain management: pain level controlled Vital Signs Assessment: post-procedure vital signs reviewed and stable Respiratory status: spontaneous breathing, nonlabored ventilation and respiratory function stable Cardiovascular status: blood pressure returned to baseline Postop Assessment: no apparent nausea or vomiting Anesthetic complications: no   No notable events documented.  Last Vitals:  Vitals:   05/23/22 1700 05/23/22 1715  BP: (!) 88/44 (!) 94/44  Pulse: 64 89  Resp: 19 16  Temp:    SpO2: 100% 100%    Last Pain:  Vitals:   05/23/22 1700  TempSrc:   PainSc: 0-No pain                 Marthenia Rolling

## 2022-05-23 NOTE — H&P (Signed)
Edward [name] is an 51 y.o. male.   Chief Complaint: finger necrosis HPI: 51 yo male with necrosis of right long, ring, small fingers.  Has had amputation of index finger with non healing wound.  Previous partial amputation long and small fingers with non healing wounds.  Dry necrosis of ring finger.  He wishes to proceed with ray amputation for right index finger, amputation of long and small fingers at mp joint and amputation of ring finger.  Allergies:  Allergies  Allergen Reactions   Crestor [Rosuvastatin] Rash    Eye redness   Dilaudid [Hydromorphone] Itching   Robaxin [Methocarbamol] Other (See Comments)    Pt states medication made his arm pain worse.   Benadryl [Diphenhydramine] Rash    Past Medical History:  Diagnosis Date   Allergy    Anemia    getting Venofer with dialysis   Anxiety    self reported, sometimes-situational anxiety   Arthritis    bilateral hands/LEFT hip   CHF (congestive heart failure) (HCC)    Chronic kidney disease    t th s dialysis- [place] ---ESRD   Coronary artery disease    Depression    self reported, sometimes-situational depression   Diabetes mellitus    type II- on meds   Dyspnea    sometimes- fluid   GERD (gastroesophageal reflux disease)    on meds   History of blood transfusion    Hypertension    no longer on medication since starting on Hemodialysis   Myocardial infarction ([place]) 2019   Peripheral vascular disease ([place])    Sleep apnea    does not use CPAP    Past Surgical History:  Procedure Laterality Date   A/V FISTULAGRAM Left 03/06/2020   Procedure: A/V FISTULAGRAM;  Surgeon: Waynetta Sandy, MD;  Location: [facility];  Service: Cardiovascular;  Laterality: Left;   ABDOMINAL AORTOGRAM N/A 01/07/2022   Procedure: ABDOMINAL AORTOGRAM;  Surgeon: Waynetta Sandy, MD;  Location: [facility];  Service: Cardiovascular;  Laterality: N/A;   ABDOMINAL AORTOGRAM W/LOWER EXTREMITY N/A 04/09/2021    Procedure: ABDOMINAL AORTOGRAM W/LOWER EXTREMITY;  Surgeon: Waynetta Sandy, MD;  Location: [facility];  Service: Cardiovascular;  Laterality: N/A;   AMPUTATION Left 05/10/2016   Procedure: Left Transmetatarsal Amputation;  Surgeon: [name], MD;  Location: [facility];  Service: Orthopedics;  Laterality: Left;   AMPUTATION Right 05/16/2018   Procedure: PARTIAL RAY AMPUTATION FIFTH TOE RIGHT FOOT;  Surgeon: [name], DPM;  Location: [facility];  Service: Podiatry;  Laterality: Right;   AMPUTATION Right 06/17/2018   Procedure: AMPUTATION 4th RAY Right Foot;  Surgeon: Trula Slade, DPM;  Location: [facility] [place];  Service: Podiatry;  Laterality: Right;   AMPUTATION Right 06/19/2018   Procedure: RIGHT BELOW KNEE AMPUTATION;  Surgeon: [name], MD;  Location: [facility];  Service: Orthopedics;  Laterality: Right;   AMPUTATION Left 10/05/2021   Procedure: LEFT RING FINGER AMPUTATION;  Surgeon: Daryll Brod, MD;  Location: [place];  Service: Orthopedics;  Laterality: Left;  45 MIN   AMPUTATION Left 11/15/2021   Procedure: LEFT INDEX FINGER AMPUTATION;  Surgeon: Leanora Cover, MD;  Location: [place];  Service: Orthopedics;  Laterality: Left;  60 MIN   AMPUTATION Right 01/08/2022   Procedure: AMPUTATION ABOVE KNEE;  Surgeon: Waynetta Sandy, MD;  Location: [place];  Service: Vascular;  Laterality: Right;   AMPUTATION Bilateral 01/12/2022   Procedure: LEFT INDEX FINGER AMPUTATION, RIGHT INDEX, LONG  AND SMALL DIGITS AMPUTATION;  Surgeon: Leanora Cover, MD;  Location: [place];  Service: Orthopedics;  Laterality: Bilateral;   AMPUTATION Right 04/01/2022   Procedure: RIGHT INDEX FINGER AMPUTATION AT METACARPAL PHALANGEAL JOINT, INCISION AND DRAINAGE RIGHT HAND;  Surgeon: Leanora Cover, MD;  Location: [place];  Service: Orthopedics;  Laterality: Right;  90 MIN   AORTIC ARCH ANGIOGRAPHY N/A 01/07/2022   Procedure: AORTIC ARCH ANGIOGRAPHY;  Surgeon: Waynetta Sandy, MD;  Location: [facility];  Service: Cardiovascular;  Laterality: N/A;   APPLICATION OF WOUND VAC Right 06/19/2018   Procedure: APPLICATION OF WOUND VAC;  Surgeon: [name], MD;  Location: [place];  Service: Orthopedics;  Laterality: Right;   AV FISTULA PLACEMENT Left 11/25/2019   Procedure: LEFT ARM Brachiocephalic  ARTERIOVENOUS (AV) FISTULA CREATION;  Surgeon: Rosetta Posner, MD;  Location: [facility];  Service: Vascular;  Laterality: Left;   Home Left 07/19/2020   Procedure: LEFT SECOND STAGE [place];  Surgeon: Waynetta Sandy, MD;  Location: [place];  Service: Vascular;  Laterality: Left;   CIRCUMCISION  09/02/2011   Procedure: CIRCUMCISION ADULT;  Surgeon: Molli Hazard, MD;  Location: [place];  Service: Urology;  Laterality: N/A;   CORONARY STENT INTERVENTION N/A 07/29/2018   Procedure: CORONARY STENT INTERVENTION;  Surgeon: Leonie Man, MD;  Location: [facility];  Service: Cardiovascular;  Laterality: N/A;   DEBRIDEMENT AND CLOSURE WOUND Left 11/15/2021   Procedure: LEFT HAND WOUND CLOSURE;  Surgeon: Leanora Cover, MD;  Location: [place];  Service: Orthopedics;  Laterality: Left;   I & D EXTREMITY  09/02/2011   Procedure: IRRIGATION AND DEBRIDEMENT EXTREMITY;  Surgeon: Theodoro Kos, DO;  Location: [facility];  Service: Plastics;  Laterality: N/A;   INCISION AND DRAINAGE OF WOUND  08/12/2011   Procedure: IRRIGATION AND DEBRIDEMENT WOUND;  Surgeon: Zenovia Jarred, MD;  Location: [place];  Service: General;;  perineum   INSERTION OF DIALYSIS CATHETER N/A 11/25/2019   Procedure: INSERTION OF [place] Internal Jugular DIALYSIS CATHETER;  Surgeon: Rosetta Posner, MD;  Location: [place];  Service: Vascular;  Laterality: N/A;   [place]  08/12/2011   Procedure: IRRIGATION AND DEBRIDEMENT ABSCESS;  Surgeon: Molli Hazard, MD;  Location: [place];  Service: Urology;  Laterality: N/A;   irrigation and debridment perineum     IRRIGATION AND DEBRIDEMENT ABSCESS  08/14/2011   Procedure: MINOR INCISION AND DRAINAGE OF ABSCESS;  Surgeon: Molli Hazard, MD;  Location: [place];  Service: Urology;  Laterality: N/A;  Perineal Wound Debridement;Placement Bilateral Testicular Thigh Pouches   LEFT HEART CATH AND CORONARY ANGIOGRAPHY N/A 07/29/2018   Procedure: LEFT HEART CATH AND CORONARY ANGIOGRAPHY;  Surgeon: Leonie Man, MD;  Location: [facility];  Service: Cardiovascular;  Laterality: N/A;   LOWER EXTREMITY ANGIOGRAPHY Bilateral 01/07/2022   Procedure: Lower Extremity Angiography;  Surgeon: Waynetta Sandy, MD;  Location: [facility];  Service: Cardiovascular;  Laterality: Bilateral;   PERIPHERAL VASCULAR ATHERECTOMY Left 04/09/2021   Procedure: PERIPHERAL VASCULAR ATHERECTOMY;  Surgeon: Waynetta Sandy, MD;  Location: [facility];  Service: Cardiovascular;  Laterality: Left;  TP trunk and peroneal arteries   PERIPHERAL VASCULAR BALLOON ANGIOPLASTY Left 04/09/2021   Procedure: PERIPHERAL VASCULAR BALLOON ANGIOPLASTY;  Surgeon: Waynetta Sandy, MD;  Location: [facility];  Service: Cardiovascular;  Laterality: Left;  Popliteal   REVISON OF ARTERIOVENOUS FISTULA Left  05/24/2020   Procedure: LEFT ARM ARTERIOVENOUS FISTULA  CONVERSION TO BASILIC VEIN FISTULA;  Surgeon: Waynetta Sandy, MD;  Location: [place];  Service: Vascular;  Laterality: Left;   TOTAL HIP ARTHROPLASTY Left 10/11/2016   Procedure: LEFT TOTAL HIP ARTHROPLASTY ANTERIOR APPROACH;  Surgeon: Mcarthur Rossetti, MD;  Location: [facility];  Service: Orthopedics;  Laterality: Left;   UPPER EXTREMITY ANGIOGRAPHY Bilateral 01/07/2022   Procedure: Upper Extremity Angiography;  Surgeon: Waynetta Sandy, MD;  Location: [facility];  Service: Cardiovascular;  Laterality: Bilateral;    Family History: Family History  Problem Relation Age of  Onset   Diabetes Other    Pancreatic cancer Mother 44   Colon cancer Neg Hx    Colon polyps Neg Hx    Esophageal cancer Neg Hx    Rectal cancer Neg Hx    Stomach cancer Neg Hx     Social History:   reports that he quit smoking about 2 years ago. His smoking use included cigarettes. He has a 6.00 pack-year smoking history. He has never used smokeless tobacco. He reports that he does not currently use alcohol. He reports that he does not use drugs.  Medications: Medications Prior to Admission  Medication Sig Dispense Refill   acetaminophen (TYLENOL) 500 MG tablet Take 1,000 mg by mouth Every Tuesday,Thursday,and Saturday with dialysis.     aspirin EC 81 MG tablet Take 81 mg by mouth daily. Swallow whole.     atorvastatin (LIPITOR) 80 MG tablet Take 80 mg by mouth in the morning.     clopidogrel (PLAVIX) 75 MG tablet TAKE ONE TABLET BY MOUTH ONCE DAILY (Patient taking differently: Take 75 mg by mouth daily.) 30 tablet 11   gabapentin (NEURONTIN) 100 MG capsule Take 100 mg by mouth 3 (three) times daily.     insulin aspart protamine - aspart (NOVOLOG MIX 70/30 FLEXPEN) (70-30) 100 UNIT/ML FlexPen Inject 0.3 mLs (30 Units total) into the skin 2 (two) times daily with a meal. (Patient taking differently: Inject 0-30 Units into the skin 2 (two) times daily as needed.) 15 mL 0   linaclotide (LINZESS) 72 MCG capsule Take 1 capsule (72 mcg total) by mouth daily before breakfast. (Patient taking differently: Take 72 mcg by mouth in the morning.) 8 capsule 0   pantoprazole (PROTONIX) 40 MG tablet TAKE ONE TABLET BY MOUTH EVERY MORNING and TAKE ONE TABLET BY MOUTH EVERYDAY AT BEDTIME (Patient taking differently: Take 40 mg by mouth daily.) 180 tablet 1   sulfamethoxazole-trimethoprim (BACTRIM DS) 800-160 MG tablet Take 1 tablet by mouth 2 (two) times daily.     traMADol (ULTRAM) 50 MG tablet Take 50 mg by mouth 2 (two) times daily.     VELPHORO 500 MG chewable tablet Chew 1,000 mg by mouth 2 (two) times  daily.     ALPRAZolam (XANAX) 0.25 MG tablet Take 0.25 mg by mouth daily.     bismuth subsalicylate (PEPTO BISMOL) 262 MG/15ML suspension Take 30 mLs by mouth every 6 (six) hours as needed for indigestion.     ezetimibe (ZETIA) 10 MG tablet Take 1 tablet (10 mg total) by mouth daily. (Patient not taking: Reported on 05/11/2022) 30 tablet 3   HUMALOG MIX 75/25 KWIKPEN (75-25) 100 UNIT/ML KwikPen Inject 30 Units into the skin daily.     Insulin Pen Needle 31G X 5 MM MISC Use daily to inject insulin as instructed 100 each 2    Results for orders placed or performed during the hospital encounter  of 05/23/22 (from the past 48 hour(s))  Glucose, capillary     Status: Abnormal   Collection Time: 05/23/22 12:33 PM  Result Value Ref Range   Glucose-Capillary 122 (H) 70 - 99 mg/dL    Comment: Glucose reference range applies only to samples taken after fasting for at least 8 hours.   Comment 1 Notify RN    Comment 2 Document in Chart   I-STAT, chem 8     Status: Abnormal   Collection Time: 05/23/22 12:49 PM  Result Value Ref Range   Sodium 137 135 - 145 mmol/L   Potassium 4.3 3.5 - 5.1 mmol/L   Chloride 98 98 - 111 mmol/L   BUN 44 (H) 6 - 20 mg/dL   Creatinine, Ser 5.80 (H) 0.61 - 1.24 mg/dL   Glucose, Bld 93 70 - 99 mg/dL    Comment: Glucose reference range applies only to samples taken after fasting for at least 8 hours.   Calcium, Ion 0.96 (L) 1.15 - 1.40 mmol/L   TCO2 27 22 - 32 mmol/L   Hemoglobin 12.9 (L) 13.0 - 17.0 g/dL   HCT 38.0 (L) 39.0 - 52.0 %    No results found.    Blood pressure 101/88, pulse 74, temperature 98.8 F (37.1 C), temperature source Oral, resp. rate 17, height '6\' 1"'$  (1.854 m), weight 90.3 kg, SpO2 100 %.  General appearance: alert, cooperative, and appears stated age Head: Normocephalic, without obvious abnormality, atraumatic Neck: supple, symmetrical, trachea midline Extremities: non healing wound index finger, long finger, small finger.  Necrosis of ring  finger. Pulses: 2+ and symmetric Skin: Skin color, texture, turgor normal. No rashes or lesions Neurologic: Grossly normal Incision/Wound: as above  Assessment/Plan Non healing wounds and necrosis of diigts right hand.  Plan ray amputation right index finger, amputation long and small at mp joint, and ring finger amputation at pip joint vs mp joint.  Non operative and operative treatment options have been discussed with the patient and patient wishes to proceed with operative treatment. Risks, benefits, and alternatives of surgery have been discussed and the patient agrees with the plan of care.   Leanora Cover 05/23/2022, 2:48 PM

## 2022-05-23 NOTE — Discharge Instructions (Addendum)

## 2022-05-24 ENCOUNTER — Encounter (HOSPITAL_COMMUNITY): Payer: Self-pay | Admitting: Orthopedic Surgery

## 2022-05-25 DIAGNOSIS — R52 Pain, unspecified: Secondary | ICD-10-CM | POA: Diagnosis not present

## 2022-05-25 DIAGNOSIS — L299 Pruritus, unspecified: Secondary | ICD-10-CM | POA: Diagnosis not present

## 2022-05-25 DIAGNOSIS — N186 End stage renal disease: Secondary | ICD-10-CM | POA: Diagnosis not present

## 2022-05-25 DIAGNOSIS — D689 Coagulation defect, unspecified: Secondary | ICD-10-CM | POA: Diagnosis not present

## 2022-05-25 DIAGNOSIS — E1129 Type 2 diabetes mellitus with other diabetic kidney complication: Secondary | ICD-10-CM | POA: Diagnosis not present

## 2022-05-25 DIAGNOSIS — N2581 Secondary hyperparathyroidism of renal origin: Secondary | ICD-10-CM | POA: Diagnosis not present

## 2022-05-25 DIAGNOSIS — D631 Anemia in chronic kidney disease: Secondary | ICD-10-CM | POA: Diagnosis not present

## 2022-05-25 DIAGNOSIS — Z992 Dependence on renal dialysis: Secondary | ICD-10-CM | POA: Diagnosis not present

## 2022-05-27 DIAGNOSIS — T148XXA Other injury of unspecified body region, initial encounter: Secondary | ICD-10-CM | POA: Diagnosis not present

## 2022-05-27 DIAGNOSIS — I96 Gangrene, not elsewhere classified: Secondary | ICD-10-CM | POA: Diagnosis not present

## 2022-05-27 DIAGNOSIS — M79644 Pain in right finger(s): Secondary | ICD-10-CM | POA: Diagnosis not present

## 2022-05-27 DIAGNOSIS — I998 Other disorder of circulatory system: Secondary | ICD-10-CM | POA: Diagnosis not present

## 2022-05-27 DIAGNOSIS — S68119D Complete traumatic metacarpophalangeal amputation of unspecified finger, subsequent encounter: Secondary | ICD-10-CM | POA: Diagnosis not present

## 2022-05-27 LAB — SURGICAL PATHOLOGY, GROSS ONLY (NOT ARMC)

## 2022-05-28 DIAGNOSIS — R52 Pain, unspecified: Secondary | ICD-10-CM | POA: Diagnosis not present

## 2022-05-28 DIAGNOSIS — N186 End stage renal disease: Secondary | ICD-10-CM | POA: Diagnosis not present

## 2022-05-28 DIAGNOSIS — N2581 Secondary hyperparathyroidism of renal origin: Secondary | ICD-10-CM | POA: Diagnosis not present

## 2022-05-28 DIAGNOSIS — D689 Coagulation defect, unspecified: Secondary | ICD-10-CM | POA: Diagnosis not present

## 2022-05-28 DIAGNOSIS — Z992 Dependence on renal dialysis: Secondary | ICD-10-CM | POA: Diagnosis not present

## 2022-05-28 DIAGNOSIS — L299 Pruritus, unspecified: Secondary | ICD-10-CM | POA: Diagnosis not present

## 2022-05-28 DIAGNOSIS — D631 Anemia in chronic kidney disease: Secondary | ICD-10-CM | POA: Diagnosis not present

## 2022-05-28 DIAGNOSIS — E1129 Type 2 diabetes mellitus with other diabetic kidney complication: Secondary | ICD-10-CM | POA: Diagnosis not present

## 2022-05-28 LAB — AEROBIC/ANAEROBIC CULTURE W GRAM STAIN (SURGICAL/DEEP WOUND)

## 2022-05-30 DIAGNOSIS — N2581 Secondary hyperparathyroidism of renal origin: Secondary | ICD-10-CM | POA: Diagnosis not present

## 2022-05-30 DIAGNOSIS — D689 Coagulation defect, unspecified: Secondary | ICD-10-CM | POA: Diagnosis not present

## 2022-05-30 DIAGNOSIS — N186 End stage renal disease: Secondary | ICD-10-CM | POA: Diagnosis not present

## 2022-05-30 DIAGNOSIS — L299 Pruritus, unspecified: Secondary | ICD-10-CM | POA: Diagnosis not present

## 2022-05-30 DIAGNOSIS — E1129 Type 2 diabetes mellitus with other diabetic kidney complication: Secondary | ICD-10-CM | POA: Diagnosis not present

## 2022-05-30 DIAGNOSIS — Z992 Dependence on renal dialysis: Secondary | ICD-10-CM | POA: Diagnosis not present

## 2022-05-30 DIAGNOSIS — R52 Pain, unspecified: Secondary | ICD-10-CM | POA: Diagnosis not present

## 2022-05-30 DIAGNOSIS — D631 Anemia in chronic kidney disease: Secondary | ICD-10-CM | POA: Diagnosis not present

## 2022-05-31 DIAGNOSIS — T148XXA Other injury of unspecified body region, initial encounter: Secondary | ICD-10-CM | POA: Diagnosis not present

## 2022-05-31 DIAGNOSIS — I96 Gangrene, not elsewhere classified: Secondary | ICD-10-CM | POA: Diagnosis not present

## 2022-05-31 DIAGNOSIS — I998 Other disorder of circulatory system: Secondary | ICD-10-CM | POA: Diagnosis not present

## 2022-06-01 DIAGNOSIS — D631 Anemia in chronic kidney disease: Secondary | ICD-10-CM | POA: Diagnosis not present

## 2022-06-01 DIAGNOSIS — E1129 Type 2 diabetes mellitus with other diabetic kidney complication: Secondary | ICD-10-CM | POA: Diagnosis not present

## 2022-06-01 DIAGNOSIS — N2581 Secondary hyperparathyroidism of renal origin: Secondary | ICD-10-CM | POA: Diagnosis not present

## 2022-06-01 DIAGNOSIS — R52 Pain, unspecified: Secondary | ICD-10-CM | POA: Diagnosis not present

## 2022-06-01 DIAGNOSIS — L299 Pruritus, unspecified: Secondary | ICD-10-CM | POA: Diagnosis not present

## 2022-06-01 DIAGNOSIS — D689 Coagulation defect, unspecified: Secondary | ICD-10-CM | POA: Diagnosis not present

## 2022-06-01 DIAGNOSIS — Z992 Dependence on renal dialysis: Secondary | ICD-10-CM | POA: Diagnosis not present

## 2022-06-01 DIAGNOSIS — N186 End stage renal disease: Secondary | ICD-10-CM | POA: Diagnosis not present

## 2022-06-03 DIAGNOSIS — M79644 Pain in right finger(s): Secondary | ICD-10-CM | POA: Diagnosis not present

## 2022-06-03 DIAGNOSIS — M25641 Stiffness of right hand, not elsewhere classified: Secondary | ICD-10-CM | POA: Diagnosis not present

## 2022-06-03 DIAGNOSIS — M79645 Pain in left finger(s): Secondary | ICD-10-CM | POA: Diagnosis not present

## 2022-06-03 DIAGNOSIS — T148XXA Other injury of unspecified body region, initial encounter: Secondary | ICD-10-CM | POA: Diagnosis not present

## 2022-06-03 DIAGNOSIS — I998 Other disorder of circulatory system: Secondary | ICD-10-CM | POA: Diagnosis not present

## 2022-06-03 DIAGNOSIS — L02519 Cutaneous abscess of unspecified hand: Secondary | ICD-10-CM | POA: Diagnosis not present

## 2022-06-03 DIAGNOSIS — S68119D Complete traumatic metacarpophalangeal amputation of unspecified finger, subsequent encounter: Secondary | ICD-10-CM | POA: Diagnosis not present

## 2022-06-03 DIAGNOSIS — I96 Gangrene, not elsewhere classified: Secondary | ICD-10-CM | POA: Diagnosis not present

## 2022-06-04 DIAGNOSIS — Z992 Dependence on renal dialysis: Secondary | ICD-10-CM | POA: Diagnosis not present

## 2022-06-04 DIAGNOSIS — D631 Anemia in chronic kidney disease: Secondary | ICD-10-CM | POA: Diagnosis not present

## 2022-06-04 DIAGNOSIS — E1129 Type 2 diabetes mellitus with other diabetic kidney complication: Secondary | ICD-10-CM | POA: Diagnosis not present

## 2022-06-04 DIAGNOSIS — N186 End stage renal disease: Secondary | ICD-10-CM | POA: Diagnosis not present

## 2022-06-04 DIAGNOSIS — L299 Pruritus, unspecified: Secondary | ICD-10-CM | POA: Diagnosis not present

## 2022-06-04 DIAGNOSIS — D689 Coagulation defect, unspecified: Secondary | ICD-10-CM | POA: Diagnosis not present

## 2022-06-04 DIAGNOSIS — R52 Pain, unspecified: Secondary | ICD-10-CM | POA: Diagnosis not present

## 2022-06-04 DIAGNOSIS — N2581 Secondary hyperparathyroidism of renal origin: Secondary | ICD-10-CM | POA: Diagnosis not present

## 2022-06-06 DIAGNOSIS — D631 Anemia in chronic kidney disease: Secondary | ICD-10-CM | POA: Diagnosis not present

## 2022-06-06 DIAGNOSIS — D689 Coagulation defect, unspecified: Secondary | ICD-10-CM | POA: Diagnosis not present

## 2022-06-06 DIAGNOSIS — E1129 Type 2 diabetes mellitus with other diabetic kidney complication: Secondary | ICD-10-CM | POA: Diagnosis not present

## 2022-06-06 DIAGNOSIS — N2581 Secondary hyperparathyroidism of renal origin: Secondary | ICD-10-CM | POA: Diagnosis not present

## 2022-06-06 DIAGNOSIS — Z992 Dependence on renal dialysis: Secondary | ICD-10-CM | POA: Diagnosis not present

## 2022-06-06 DIAGNOSIS — N186 End stage renal disease: Secondary | ICD-10-CM | POA: Diagnosis not present

## 2022-06-06 DIAGNOSIS — L299 Pruritus, unspecified: Secondary | ICD-10-CM | POA: Diagnosis not present

## 2022-06-06 DIAGNOSIS — R52 Pain, unspecified: Secondary | ICD-10-CM | POA: Diagnosis not present

## 2022-06-07 DIAGNOSIS — M79644 Pain in right finger(s): Secondary | ICD-10-CM | POA: Diagnosis not present

## 2022-06-07 DIAGNOSIS — M79645 Pain in left finger(s): Secondary | ICD-10-CM | POA: Diagnosis not present

## 2022-06-07 DIAGNOSIS — L02519 Cutaneous abscess of unspecified hand: Secondary | ICD-10-CM | POA: Diagnosis not present

## 2022-06-07 DIAGNOSIS — T148XXA Other injury of unspecified body region, initial encounter: Secondary | ICD-10-CM | POA: Diagnosis not present

## 2022-06-07 DIAGNOSIS — M25641 Stiffness of right hand, not elsewhere classified: Secondary | ICD-10-CM | POA: Diagnosis not present

## 2022-06-07 DIAGNOSIS — S68119D Complete traumatic metacarpophalangeal amputation of unspecified finger, subsequent encounter: Secondary | ICD-10-CM | POA: Diagnosis not present

## 2022-06-07 DIAGNOSIS — I998 Other disorder of circulatory system: Secondary | ICD-10-CM | POA: Diagnosis not present

## 2022-06-07 DIAGNOSIS — I96 Gangrene, not elsewhere classified: Secondary | ICD-10-CM | POA: Diagnosis not present

## 2022-06-08 DIAGNOSIS — D689 Coagulation defect, unspecified: Secondary | ICD-10-CM | POA: Diagnosis not present

## 2022-06-08 DIAGNOSIS — N2581 Secondary hyperparathyroidism of renal origin: Secondary | ICD-10-CM | POA: Diagnosis not present

## 2022-06-08 DIAGNOSIS — D631 Anemia in chronic kidney disease: Secondary | ICD-10-CM | POA: Diagnosis not present

## 2022-06-08 DIAGNOSIS — E1129 Type 2 diabetes mellitus with other diabetic kidney complication: Secondary | ICD-10-CM | POA: Diagnosis not present

## 2022-06-08 DIAGNOSIS — R52 Pain, unspecified: Secondary | ICD-10-CM | POA: Diagnosis not present

## 2022-06-08 DIAGNOSIS — L299 Pruritus, unspecified: Secondary | ICD-10-CM | POA: Diagnosis not present

## 2022-06-08 DIAGNOSIS — N186 End stage renal disease: Secondary | ICD-10-CM | POA: Diagnosis not present

## 2022-06-08 DIAGNOSIS — Z992 Dependence on renal dialysis: Secondary | ICD-10-CM | POA: Diagnosis not present

## 2022-06-10 DIAGNOSIS — M79644 Pain in right finger(s): Secondary | ICD-10-CM | POA: Diagnosis not present

## 2022-06-10 DIAGNOSIS — T148XXA Other injury of unspecified body region, initial encounter: Secondary | ICD-10-CM | POA: Diagnosis not present

## 2022-06-10 DIAGNOSIS — S68119D Complete traumatic metacarpophalangeal amputation of unspecified finger, subsequent encounter: Secondary | ICD-10-CM | POA: Diagnosis not present

## 2022-06-11 DIAGNOSIS — D631 Anemia in chronic kidney disease: Secondary | ICD-10-CM | POA: Diagnosis not present

## 2022-06-11 DIAGNOSIS — D689 Coagulation defect, unspecified: Secondary | ICD-10-CM | POA: Diagnosis not present

## 2022-06-11 DIAGNOSIS — N186 End stage renal disease: Secondary | ICD-10-CM | POA: Diagnosis not present

## 2022-06-11 DIAGNOSIS — E1129 Type 2 diabetes mellitus with other diabetic kidney complication: Secondary | ICD-10-CM | POA: Diagnosis not present

## 2022-06-11 DIAGNOSIS — N2581 Secondary hyperparathyroidism of renal origin: Secondary | ICD-10-CM | POA: Diagnosis not present

## 2022-06-11 DIAGNOSIS — R52 Pain, unspecified: Secondary | ICD-10-CM | POA: Diagnosis not present

## 2022-06-11 DIAGNOSIS — L299 Pruritus, unspecified: Secondary | ICD-10-CM | POA: Diagnosis not present

## 2022-06-11 DIAGNOSIS — Z992 Dependence on renal dialysis: Secondary | ICD-10-CM | POA: Diagnosis not present

## 2022-06-12 DIAGNOSIS — Z89611 Acquired absence of right leg above knee: Secondary | ICD-10-CM | POA: Diagnosis not present

## 2022-06-13 DIAGNOSIS — N186 End stage renal disease: Secondary | ICD-10-CM | POA: Diagnosis not present

## 2022-06-13 DIAGNOSIS — D689 Coagulation defect, unspecified: Secondary | ICD-10-CM | POA: Diagnosis not present

## 2022-06-13 DIAGNOSIS — N2581 Secondary hyperparathyroidism of renal origin: Secondary | ICD-10-CM | POA: Diagnosis not present

## 2022-06-13 DIAGNOSIS — L299 Pruritus, unspecified: Secondary | ICD-10-CM | POA: Diagnosis not present

## 2022-06-13 DIAGNOSIS — Z992 Dependence on renal dialysis: Secondary | ICD-10-CM | POA: Diagnosis not present

## 2022-06-13 DIAGNOSIS — E1129 Type 2 diabetes mellitus with other diabetic kidney complication: Secondary | ICD-10-CM | POA: Diagnosis not present

## 2022-06-13 DIAGNOSIS — R52 Pain, unspecified: Secondary | ICD-10-CM | POA: Diagnosis not present

## 2022-06-13 DIAGNOSIS — D631 Anemia in chronic kidney disease: Secondary | ICD-10-CM | POA: Diagnosis not present

## 2022-06-14 DIAGNOSIS — R52 Pain, unspecified: Secondary | ICD-10-CM | POA: Diagnosis not present

## 2022-06-14 DIAGNOSIS — I998 Other disorder of circulatory system: Secondary | ICD-10-CM | POA: Diagnosis not present

## 2022-06-14 DIAGNOSIS — T148XXA Other injury of unspecified body region, initial encounter: Secondary | ICD-10-CM | POA: Diagnosis not present

## 2022-06-15 DIAGNOSIS — D631 Anemia in chronic kidney disease: Secondary | ICD-10-CM | POA: Diagnosis not present

## 2022-06-15 DIAGNOSIS — E1129 Type 2 diabetes mellitus with other diabetic kidney complication: Secondary | ICD-10-CM | POA: Diagnosis not present

## 2022-06-15 DIAGNOSIS — L299 Pruritus, unspecified: Secondary | ICD-10-CM | POA: Diagnosis not present

## 2022-06-15 DIAGNOSIS — R52 Pain, unspecified: Secondary | ICD-10-CM | POA: Diagnosis not present

## 2022-06-15 DIAGNOSIS — N186 End stage renal disease: Secondary | ICD-10-CM | POA: Diagnosis not present

## 2022-06-15 DIAGNOSIS — Z992 Dependence on renal dialysis: Secondary | ICD-10-CM | POA: Diagnosis not present

## 2022-06-15 DIAGNOSIS — N2581 Secondary hyperparathyroidism of renal origin: Secondary | ICD-10-CM | POA: Diagnosis not present

## 2022-06-15 DIAGNOSIS — D689 Coagulation defect, unspecified: Secondary | ICD-10-CM | POA: Diagnosis not present

## 2022-06-17 DIAGNOSIS — I96 Gangrene, not elsewhere classified: Secondary | ICD-10-CM | POA: Diagnosis not present

## 2022-06-17 DIAGNOSIS — M79645 Pain in left finger(s): Secondary | ICD-10-CM | POA: Diagnosis not present

## 2022-06-17 DIAGNOSIS — T148XXA Other injury of unspecified body region, initial encounter: Secondary | ICD-10-CM | POA: Diagnosis not present

## 2022-06-17 DIAGNOSIS — S68119D Complete traumatic metacarpophalangeal amputation of unspecified finger, subsequent encounter: Secondary | ICD-10-CM | POA: Diagnosis not present

## 2022-06-17 DIAGNOSIS — M79644 Pain in right finger(s): Secondary | ICD-10-CM | POA: Diagnosis not present

## 2022-06-17 DIAGNOSIS — I998 Other disorder of circulatory system: Secondary | ICD-10-CM | POA: Diagnosis not present

## 2022-06-17 DIAGNOSIS — L02519 Cutaneous abscess of unspecified hand: Secondary | ICD-10-CM | POA: Diagnosis not present

## 2022-06-17 DIAGNOSIS — M25641 Stiffness of right hand, not elsewhere classified: Secondary | ICD-10-CM | POA: Diagnosis not present

## 2022-06-18 DIAGNOSIS — N186 End stage renal disease: Secondary | ICD-10-CM | POA: Diagnosis not present

## 2022-06-18 DIAGNOSIS — D631 Anemia in chronic kidney disease: Secondary | ICD-10-CM | POA: Diagnosis not present

## 2022-06-18 DIAGNOSIS — E1129 Type 2 diabetes mellitus with other diabetic kidney complication: Secondary | ICD-10-CM | POA: Diagnosis not present

## 2022-06-18 DIAGNOSIS — L299 Pruritus, unspecified: Secondary | ICD-10-CM | POA: Diagnosis not present

## 2022-06-18 DIAGNOSIS — R52 Pain, unspecified: Secondary | ICD-10-CM | POA: Diagnosis not present

## 2022-06-18 DIAGNOSIS — Z992 Dependence on renal dialysis: Secondary | ICD-10-CM | POA: Diagnosis not present

## 2022-06-18 DIAGNOSIS — N2581 Secondary hyperparathyroidism of renal origin: Secondary | ICD-10-CM | POA: Diagnosis not present

## 2022-06-18 DIAGNOSIS — D689 Coagulation defect, unspecified: Secondary | ICD-10-CM | POA: Diagnosis not present

## 2022-06-20 ENCOUNTER — Encounter (HOSPITAL_BASED_OUTPATIENT_CLINIC_OR_DEPARTMENT_OTHER): Payer: Medicare Other | Attending: General Surgery | Admitting: General Surgery

## 2022-06-20 DIAGNOSIS — E11622 Type 2 diabetes mellitus with other skin ulcer: Secondary | ICD-10-CM | POA: Diagnosis not present

## 2022-06-20 DIAGNOSIS — I5042 Chronic combined systolic (congestive) and diastolic (congestive) heart failure: Secondary | ICD-10-CM | POA: Diagnosis not present

## 2022-06-20 DIAGNOSIS — Z992 Dependence on renal dialysis: Secondary | ICD-10-CM | POA: Insufficient documentation

## 2022-06-20 DIAGNOSIS — B965 Pseudomonas (aeruginosa) (mallei) (pseudomallei) as the cause of diseases classified elsewhere: Secondary | ICD-10-CM | POA: Insufficient documentation

## 2022-06-20 DIAGNOSIS — I132 Hypertensive heart and chronic kidney disease with heart failure and with stage 5 chronic kidney disease, or end stage renal disease: Secondary | ICD-10-CM | POA: Diagnosis not present

## 2022-06-20 DIAGNOSIS — Z89611 Acquired absence of right leg above knee: Secondary | ICD-10-CM | POA: Insufficient documentation

## 2022-06-20 DIAGNOSIS — L97825 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 without evidence of necrosis: Secondary | ICD-10-CM | POA: Insufficient documentation

## 2022-06-20 DIAGNOSIS — E1129 Type 2 diabetes mellitus with other diabetic kidney complication: Secondary | ICD-10-CM | POA: Diagnosis not present

## 2022-06-20 DIAGNOSIS — D631 Anemia in chronic kidney disease: Secondary | ICD-10-CM | POA: Diagnosis not present

## 2022-06-20 DIAGNOSIS — L97522 Non-pressure chronic ulcer of other part of left foot with fat layer exposed: Secondary | ICD-10-CM | POA: Diagnosis not present

## 2022-06-20 DIAGNOSIS — E1152 Type 2 diabetes mellitus with diabetic peripheral angiopathy with gangrene: Secondary | ICD-10-CM | POA: Insufficient documentation

## 2022-06-20 DIAGNOSIS — E11621 Type 2 diabetes mellitus with foot ulcer: Secondary | ICD-10-CM | POA: Insufficient documentation

## 2022-06-20 DIAGNOSIS — L97822 Non-pressure chronic ulcer of other part of left lower leg with fat layer exposed: Secondary | ICD-10-CM | POA: Diagnosis not present

## 2022-06-20 DIAGNOSIS — L97325 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: Secondary | ICD-10-CM | POA: Insufficient documentation

## 2022-06-20 DIAGNOSIS — Z89512 Acquired absence of left leg below knee: Secondary | ICD-10-CM | POA: Diagnosis not present

## 2022-06-20 DIAGNOSIS — E1122 Type 2 diabetes mellitus with diabetic chronic kidney disease: Secondary | ICD-10-CM | POA: Diagnosis not present

## 2022-06-20 DIAGNOSIS — L97322 Non-pressure chronic ulcer of left ankle with fat layer exposed: Secondary | ICD-10-CM | POA: Diagnosis not present

## 2022-06-20 DIAGNOSIS — Z89432 Acquired absence of left foot: Secondary | ICD-10-CM | POA: Diagnosis not present

## 2022-06-20 DIAGNOSIS — N2581 Secondary hyperparathyroidism of renal origin: Secondary | ICD-10-CM | POA: Diagnosis not present

## 2022-06-20 DIAGNOSIS — D689 Coagulation defect, unspecified: Secondary | ICD-10-CM | POA: Diagnosis not present

## 2022-06-20 DIAGNOSIS — R52 Pain, unspecified: Secondary | ICD-10-CM | POA: Diagnosis not present

## 2022-06-20 DIAGNOSIS — N186 End stage renal disease: Secondary | ICD-10-CM | POA: Insufficient documentation

## 2022-06-20 DIAGNOSIS — L299 Pruritus, unspecified: Secondary | ICD-10-CM | POA: Diagnosis not present

## 2022-06-20 DIAGNOSIS — T8249XD Other complication of vascular dialysis catheter, subsequent encounter: Secondary | ICD-10-CM | POA: Diagnosis not present

## 2022-06-20 NOTE — Progress Notes (Signed)
LYNK, MARTI ([id number]) [phone].pdf Page 1 of 15 Visit Report for 06/20/2022 Arrival Information Details Patient Name: Date of Service: Edward [name] MES L. 06/20/2022 2:30 PM Medical Record Number: [id number] Patient Account Number: [id number] Date of Birth/Sex: Treating RN: Oct 03, 1970 (51 y.o. [name] Primary Care Dasia Guerrier: [name] Other Clinician: Referring Parthiv Mucci: Treating Christpoher Sievers/Extender: Kevan Ny in Treatment: 5 Visit Information History Since Last Visit All ordered tests and consults were completed: No Patient Arrived: Wheel Chair Added or deleted any medications: No Arrival Time: 15:01 Any new allergies or adverse reactions: No Accompanied By: son Had a fall or experienced change in No Transfer Assistance: None activities of daily living that may affect Patient Identification Verified: Yes risk of falls: Secondary Verification Process Completed: Yes Signs or symptoms of abuse/neglect since last visito No Patient Requires Transmission-Based Precautions: No Hospitalized since last visit: No Patient Has Alerts: Yes Implantable device outside of the clinic excluding No Patient Alerts: Patient on Blood Thinner cellular tissue based products placed in the center plavix since last visit: Pain Present Now: No Electronic Signature(s) Signed: 06/20/2022 3:30:06 PM By: Worthy Rancher Entered By: Worthy Rancher on 06/20/2022 15:02:01 -------------------------------------------------------------------------------- Encounter Discharge Information Details Patient Name: Date of Service: Edward [name], [name] 06/20/2022 2:30 PM Medical Record Number: [id number] Patient Account Number: [id number] Date of Birth/Sex: Treating RN: [birth year] (51 y.o. [name] Primary Care Malie Kashani: [name] Other Clinician: Referring Korvin Valentine: Treating Tou Hayner/Extender: Kevan Ny in  Treatment: 5 Encounter Discharge Information Items Post Procedure Vitals Discharge Condition: Stable Temperature (F): 98.5 Ambulatory Status: Wheelchair Pulse (bpm): 82 Discharge Destination: Home Respiratory Rate (breaths/min): 20 Transportation: Private Auto Blood Pressure (mmHg): 117/67 Accompanied By: son Schedule Follow-up Appointment: Yes Clinical Summary of Care: Patient Declined Electronic Signature(s) Signed: 06/20/2022 5:05:53 PM By: Dellie Catholic RN Entered By: Dellie Catholic on 06/20/2022 17:02:00 [name] ([id number]) [id number] Page 2 of 15 -------------------------------------------------------------------------------- Lower Extremity Assessment Details Patient Name: Date of Service: Edward [name] MES L. 06/20/2022 2:30 PM Medical Record Number: [id number] Patient Account Number: [id number] Date of Birth/Sex: Treating RN: 08/29/1970 (51 y.o. [name] Primary Care Tani Virgo: [name] Other Clinician: Referring Cristian Davitt: Treating Junious Ragone/Extender: Cecile Hearing Weeks in Treatment: 5 Edema Assessment Assessed: [Left: No] [Right: No] [Left: Edema] [Right: :] Calf Left: Right: Point of Measurement: From Medial Instep 42.5 cm Ankle Left: Right: Point of Measurement: From Medial Instep 23.2 cm Electronic Signature(s) Signed: 06/20/2022 5:05:53 PM By: Dellie Catholic RN Entered By: Dellie Catholic on 06/20/2022 15:33:44 -------------------------------------------------------------------------------- Multi Wound Chart Details Patient Name: Date of Service: Edward [name], [name] 06/20/2022 2:30 PM Medical Record Number: [id number] Patient Account Number: [id number] Date of Birth/Sex: Treating RN: 12-24-1970 (51 y.o. [name] Primary Care Toneshia Coello: [name] Other Clinician: Referring Callee Rohrig: Treating Demorio Seeley/Extender: Kevan Ny in Treatment: 5 Vital  Signs Height(in): Pulse(bpm): 80 Weight(lbs): Blood Pressure(mmHg): 117/67 Body Mass Index(BMI): Temperature(F): 98.5 Respiratory Rate(breaths/min): 20 [11:Photos:] [[id number] Page 3 of 15] Left, Anterior Lower Leg Left Achilles Left, Circumferential Lower Leg Wound Location: Gradually Appeared Gradually Appeared Gradually Appeared Wounding Event: Diabetic Wound/Ulcer of the Lower Diabetic Wound/Ulcer of the Lower Diabetic Wound/Ulcer of the Lower Primary Etiology: Extremity Extremity Extremity Cataracts, Anemia, Sleep Apnea, Cataracts, Anemia, Sleep Apnea, Cataracts, Anemia, Sleep Apnea, Comorbid History: Arrhythmia, Congestive Heart Failure, Arrhythmia, Congestive Heart Failure, Arrhythmia, Congestive Heart Failure, Coronary Artery Disease, Coronary Artery Disease, Coronary Artery  Disease, Hypertension, Myocardial Infarction, Hypertension, Myocardial Infarction, Hypertension, Myocardial Infarction, Peripheral Venous Disease, Type II Peripheral Venous Disease, Type II Peripheral Venous Disease, Type II Diabetes, End Stage Renal Disease, Diabetes, End Stage Renal Disease, Diabetes, End Stage Renal Disease, Osteoarthritis, Osteomyelitis, Osteoarthritis, Osteomyelitis, Osteoarthritis, Osteomyelitis, Neuropathy, Confinement Anxiety Neuropathy, Confinement Anxiety Neuropathy, Confinement Anxiety 12/17/2021 06/15/2022 06/18/2022 Date Acquired: 5 0 0 Weeks of Treatment: Open Open Open Wound Status: No No No Wound Recurrence: No No No Clustered Wound: 5.6x8x0.1 3x2.2x0.1 18.5x16x0.1 Measurements L x W x D (cm) 35.186 5.184 232.478 A (cm) : rea 3.519 0.518 23.248 Volume (cm) : -217.70% N/A N/A % Reduction in A rea: -217.90% N/A N/A % Reduction in Volume: Grade 2 Unable to visualize wound bed Grade 2 Classification: Medium Medium Medium Exudate A mount: Serosanguineous Serosanguineous Serosanguineous Exudate Type: red, brown red, brown red,  brown Exudate Color: Small (1-33%) Small (1-33%) Medium (34-66%) Granulation A mount: Pink Pink Pink Granulation Quality: Large (67-100%) Large (67-100%) Medium (34-66%) Necrotic A mount: Eschar, Adherent Slough Eschar, Adherent Slough Eschar, Adherent Slough Necrotic Tissue: Fat Layer (Subcutaneous Tissue): Yes Fat Layer (Subcutaneous Tissue): Yes Fat Layer (Subcutaneous Tissue): Yes Exposed Structures: Muscle: Yes Fascia: No Fascia: No Tendon: No Tendon: No Muscle: No Muscle: No Joint: No Joint: No Bone: No Bone: No None None N/A Epithelialization: Debridement - Excisional Debridement - Excisional Debridement - Excisional Debridement: Pre-procedure Verification/Time Out 15:50 15:50 15:50 Taken: Lidocaine 5% topical ointment Lidocaine 5% topical ointment Lidocaine 5% topical ointment Pain Control: Necrotic/Eschar, Subcutaneous, Necrotic/Eschar, Subcutaneous, Necrotic/Eschar, Subcutaneous, Tissue Debrided: [employer] Skin/Subcutaneous Tissue Skin/Subcutaneous Tissue Skin/Subcutaneous Tissue Level: 44.8 6.6 296 Debridement A (sq cm): rea Curette Curette Curette Instrument: Minimum Minimum Minimum Bleeding: Pressure Pressure Pressure Hemostasis Achieved: 0 0 0 Procedural Pain: 0 0 0 Post Procedural Pain: Debridement Treatment Response: Procedure was tolerated well Procedure was tolerated well Procedure was tolerated well Post Debridement Measurements L x 5.6x8x0.1 3x2.2x0.1 18.5x16x0.1 W x D (cm) 3.519 0.518 23.248 Post Debridement Volume: (cm) N/A No Abnormality N/A Temperature: Debridement Debridement Debridement Procedures Performed: Wound Number: '[id number] 15 16 '$ Photos: Left, Dorsal Foot Left, Proximal, Anterior Lower Leg Left, Distal, Medial Lower Leg Wound Location: Gradually Appeared Gradually Appeared Gradually Appeared Wounding Event: Diabetic Wound/Ulcer of the Lower Diabetic Wound/Ulcer of the Lower Diabetic Wound/Ulcer of the  Lower Primary Etiology: Extremity Extremity Extremity Cataracts, Anemia, Sleep Apnea, Cataracts, Anemia, Sleep Apnea, Cataracts, Anemia, Sleep Apnea, Comorbid History: Arrhythmia, Congestive Heart Failure, Arrhythmia, Congestive Heart Failure, Arrhythmia, Congestive Heart Failure, Coronary Artery Disease, Coronary Artery Disease, Coronary Artery Disease, Hypertension, Myocardial Infarction, Hypertension, Myocardial Infarction, Hypertension, Myocardial Infarction, Peripheral Venous Disease, Type II Peripheral Venous Disease, Type II Peripheral Venous Disease, Type II Diabetes, End Stage Renal Disease, Diabetes, End Stage Renal Disease, Diabetes, End Stage Renal Disease, Osteoarthritis, Osteomyelitis, Osteoarthritis, Osteomyelitis, Osteoarthritis, Osteomyelitis, Neuropathy, Confinement Anxiety Neuropathy, Confinement Anxiety Neuropathy, Confinement Anxiety 06/18/2022 06/18/2022 06/18/2022 Date Acquired: 0 0 0 Weeks of Treatment: MARKALE, BIRDSELL ([id number]) [id number] Page 4 of 15 Open Open Open Wound Status: No No No Wound Recurrence: No No Yes Clustered Wound: 1.1x1.2x0.2 1.5x2x0.1 3x1x0.1 Measurements L x W x D (cm) 1.037 2.356 2.356 A (cm) : rea 0.207 0.236 0.236 Volume (cm) : N/A N/A N/A % Reduction in A rea: N/A N/A N/A % Reduction in Volume: Grade 2 Grade 2 Grade 2 Classification: Medium Medium Medium Exudate A mount: Serosanguineous Serosanguineous Serosanguineous Exudate Type: red, brown red, brown red, brown Exudate Color: Small (1-33%) Large (67-100%) Small (1-33%) Granulation A mount: Pink Pink  Pink Granulation Quality: Large (67-100%) Small (1-33%) Large (67-100%) Necrotic A mount: Eschar, Adherent Slough Eschar, [place] Necrotic Tissue: Fat Layer (Subcutaneous Tissue): Yes Fat Layer (Subcutaneous Tissue): Yes Fat Layer (Subcutaneous Tissue): Yes Exposed Structures: Fascia: No Fascia:  No Fascia: No Tendon: No Tendon: No Tendon: No Muscle: No Muscle: No Muscle: No Joint: No Joint: No Joint: No Bone: No Bone: No Bone: No None N/A N/A Epithelialization: Debridement - Excisional Debridement - Excisional Debridement - Excisional Debridement: Pre-procedure Verification/Time Out 15:50 15:50 15:50 Taken: Lidocaine 5% topical ointment Lidocaine 5% topical ointment Lidocaine 5% topical ointment Pain Control: Necrotic/Eschar, Subcutaneous, Necrotic/Eschar, Subcutaneous, Necrotic/Eschar, Subcutaneous, Tissue Debrided: [employer] Skin/Subcutaneous Tissue Skin/Subcutaneous Tissue Skin/Subcutaneous Tissue Level: 1.32 3 3 Debridement A (sq cm): rea Curette Curette Curette Instrument: Minimum Minimum Minimum Bleeding: Pressure Pressure Pressure Hemostasis Achieved: 0 0 0 Procedural Pain: 0 0 0 Post Procedural Pain: Debridement Treatment Response: Procedure was tolerated well Procedure was tolerated well Procedure was tolerated well Post Debridement Measurements L x 1.1x1.2x0.2 1.5x2x0.1 3x1x0.1 W x D (cm) 0.207 0.236 0.236 Post Debridement Volume: (cm) N/A N/A N/A Temperature: Debridement Debridement Debridement Procedures Performed: Treatment Notes Electronic Signature(s) Signed: 06/20/2022 4:17:49 PM By: Fredirick Maudlin MD FACS Signed: 06/20/2022 5:05:53 PM By: Dellie Catholic RN Entered By: Fredirick Maudlin on 06/20/2022 16:17:49 -------------------------------------------------------------------------------- Multi-Disciplinary Care Plan Details Patient Name: Date of Service: Edward [name], [name] 06/20/2022 2:30 PM Medical Record Number: [id number] Patient Account Number: [id number] Date of Birth/Sex: Treating RN: Apr 04, 1971 (51 y.o. [name] Primary Care Fue Cervenka: [name] Other Clinician: Referring Jakobe Blau: Treating Arriyana Rodell/Extender: Kevan Ny in Treatment: 5 Active Inactive Wound/Skin  Impairment Nursing Diagnoses: Impaired tissue integrity Knowledge deficit related to ulceration/compromised skin integrity Goals: Patient/caregiver will verbalize understanding of skin care regimen KARLOS, SCADDEN ([id number]) [id number].pdf Page 5 of 15 Date Initiated: 05/15/2022 Target Resolution Date: 10/18/2022 Goal Status: Active Ulcer/skin breakdown will have a volume reduction of 30% by week 4 Date Initiated: 05/15/2022 Target Resolution Date: 10/18/2022 Goal Status: Active Interventions: Assess patient/caregiver ability to obtain necessary supplies Assess patient/caregiver ability to perform ulcer/skin care regimen upon admission and as needed Assess ulceration(s) every visit Provide education on ulcer and skin care Notes: Electronic Signature(s) Signed: 06/20/2022 5:05:53 PM By: Dellie Catholic RN Entered By: Dellie Catholic on 06/20/2022 16:59:56 -------------------------------------------------------------------------------- Pain Assessment Details Patient Name: Date of Service: Edward [name], [name] 06/20/2022 2:30 PM Medical Record Number: [id number] Patient Account Number: [id number] Date of Birth/Sex: Treating RN: May 03, 1971 (51 y.o. [name] Primary Care Perris Conwell: [name] Other Clinician: Referring Dempsey Knotek: Treating Shatonia Hoots/Extender: Kevan Ny in Treatment: 5 Active Problems Location of Pain Severity and Description of Pain Patient Has Paino No Site Locations Pain Management and Medication Current Pain Management: Electronic Signature(s) Signed: 06/20/2022 3:30:06 PM By: Worthy Rancher Signed: 06/20/2022 5:05:53 PM By: Dellie Catholic RN Entered By: Worthy Rancher on 06/20/2022 15:12:25 [name] ([id number]) [id number] Page 6 of 15 -------------------------------------------------------------------------------- Patient/Caregiver Education Details Patient Name: Date of  Service: Edward [name] MES L. 11/2/2023andnbsp2:30 PM Medical Record Number: [id number] Patient Account Number: [id number] Date of Birth/Gender: Treating RN: Dec 13, 1970 (51 y.o. [name] Primary Care Physician: [name] Other Clinician: Referring Physician: Treating Physician/Extender: Kevan Ny in Treatment: 5 Education Assessment Education Provided To: Patient Education Topics Provided Wound/Skin Impairment: Methods: Explain/Verbal Responses: Return demonstration correctly Electronic Signature(s) Signed: 06/20/2022 5:05:53 PM By: Dellie Catholic RN Entered  By: Dellie Catholic on 06/20/2022 17:00:13 -------------------------------------------------------------------------------- Wound Assessment Details Patient Name: Date of Service: Edward [name] MES L. 06/20/2022 2:30 PM Medical Record Number: [id number] Patient Account Number: [id number] Date of Birth/Sex: Treating RN: [birth year] (51 y.o. [name] Primary Care Clois Montavon: [name] Other Clinician: Referring Izzak Fries: Treating Roger Kettles/Extender: Kevan Ny in Treatment: 5 Wound Status Wound Number: 11 Primary Diabetic Wound/Ulcer of the Lower Extremity Etiology: Wound Location: Left, Anterior Lower Leg Wound Open Wounding Event: Gradually Appeared Status: Date Acquired: 12/17/2021 Comorbid Cataracts, Anemia, Sleep Apnea, Arrhythmia, Congestive Heart Weeks Of Treatment: 5 History: Failure, Coronary Artery Disease, Hypertension, Myocardial Clustered Wound: No Infarction, Peripheral Venous Disease, Type II Diabetes, End Stage Renal Disease, Osteoarthritis, Osteomyelitis, Neuropathy, Confinement Anxiety Photos Wound Measurements Length: (cm) 5.6 Width: (cm) 8 Depth: (cm) 0.1 Area: (cm) 35.186 Volume: (cm) 3.519 BAYDEN, GIL ([id number]) Wound Description Classification: Grade 2 Exudate Amount: Medium Exudate Type:  Serosanguineous Exudate Color: red, brown Foul Odor After Cleansing: No Slough/Fibrino Yes % Reduction in Area: -217.7% % Reduction in Volume: -217.9% Epithelialization: None Tunneling: No Undermining: No [id number] Page 7 of 15 Wound Bed Granulation Amount: Small (1-33%) Exposed Structure Granulation Quality: Pink Fat Layer (Subcutaneous Tissue) Exposed: Yes Necrotic Amount: Large (67-100%) Muscle Exposed: Yes Necrotic Quality: Eschar, Adherent Slough Necrosis of Muscle: No Periwound Skin Texture Texture Color No Abnormalities Noted: No No Abnormalities Noted: No Moisture No Abnormalities Noted: No Treatment Notes Wound #11 (Lower Leg) Wound Laterality: Left, Anterior Cleanser Soap and Water Discharge Instruction: May shower and wash wound with dial antibacterial soap and water prior to dressing change. Peri-Wound Care Sween Lotion (Moisturizing lotion) Discharge Instruction: Apply moisturizing lotion as directed Topical Primary Dressing IODOFLEX 0.9% Cadexomer Iodine Pad 4x6 cm Discharge Instruction: Use Iodosorb if Iodoflex not available Secondary Dressing ABD Pad, 5x9 Discharge Instruction: Apply over primary dressing as directed. Woven Gauze Sponge, Non-Sterile 4x4 in Discharge Instruction: Apply over primary dressing as directed. Secured With Elastic Bandage 4 inch (ACE bandage) Discharge Instruction: Secure with ACE bandage as directed. Kerlix Roll Sterile, 4.5x3.1 (in/yd) Discharge Instruction: Secure with Kerlix as directed. 40M Medipore H Soft Cloth Surgical T ape, 4 x 10 (in/yd) Discharge Instruction: Secure with tape as directed. Compression Wrap Compression Stockings Add-Ons Electronic Signature(s) Signed: 06/20/2022 5:05:53 PM By: Dellie Catholic RN Entered By: Dellie Catholic on 06/20/2022 15:41:26 -------------------------------------------------------------------------------- Wound Assessment Details Patient Name: Date  of Service: Edward [name], [name] 06/20/2022 2:30 PM Medical Record Number: [id number] Patient Account Number: [id number] Date of Birth/Sex: Treating RN: Jul 17, 1971 (51 y.o. Dillan, Candela, Ayden ([id number]) [phone].pdf Page 8 of 15 Primary Care Lakita Sahlin: [name] Other Clinician: Referring Idora Brosious: Treating Anais Koenen/Extender: Kevan Ny in Treatment: 5 Wound Status Wound Number: 12 Primary Diabetic Wound/Ulcer of the Lower Extremity Etiology: Wound Location: Left Achilles Wound Open Wounding Event: Gradually Appeared Status: Date Acquired: 06/15/2022 Comorbid Cataracts, Anemia, Sleep Apnea, Arrhythmia, Congestive Heart Weeks Of Treatment: 0 History: Failure, Coronary Artery Disease, Hypertension, Myocardial Clustered Wound: No Infarction, Peripheral Venous Disease, Type II Diabetes, End Stage Renal Disease, Osteoarthritis, Osteomyelitis, Neuropathy, Confinement Anxiety Photos Wound Measurements Length: (cm) 3 Width: (cm) 2.2 Depth: (cm) 0.1 Area: (cm) 5.184 Volume: (cm) 0.518 % Reduction in Area: % Reduction in Volume: Epithelialization: None Tunneling: No Undermining: No Wound Description Classification: Unable to visualize wound bed Exudate Amount: Medium Exudate Type: Serosanguineous Exudate Color: red, brown Foul Odor After Cleansing: No Slough/Fibrino Yes Wound Bed Granulation Amount: Small (1-33%)  Exposed Structure Granulation Quality: Pink Fascia Exposed: No Necrotic Amount: Large (67-100%) Fat Layer (Subcutaneous Tissue) Exposed: Yes Necrotic Quality: Eschar, Adherent Slough Tendon Exposed: No Muscle Exposed: No Joint Exposed: No Bone Exposed: No Periwound Skin Texture Texture Color No Abnormalities Noted: No No Abnormalities Noted: No Moisture Temperature / Pain No Abnormalities Noted: No Temperature: No Abnormality Treatment Notes Wound #12 (Achilles) Wound Laterality:  Left Cleanser Soap and Water Discharge Instruction: May shower and wash wound with dial antibacterial soap and water prior to dressing change. Peri-Wound Care Sween Lotion (Moisturizing lotion) Discharge Instruction: Apply moisturizing lotion as directed Topical Primary Dressing IODOFLEX 0.9% Cadexomer Iodine Pad 4x6 cm Discharge Instruction: Use Iodosorb if Iodoflex not available MELVEN, STOCKARD ([id number]) [phone].pdf Page 9 of 15 Secondary Dressing ABD Pad, 5x9 Discharge Instruction: Apply over primary dressing as directed. Woven Gauze Sponge, Non-Sterile 4x4 in Discharge Instruction: Apply over primary dressing as directed. Secured With Elastic Bandage 4 inch (ACE bandage) Discharge Instruction: Secure with ACE bandage as directed. Kerlix Roll Sterile, 4.5x3.1 (in/yd) Discharge Instruction: Secure with Kerlix as directed. 52M Medipore H Soft Cloth Surgical T ape, 4 x 10 (in/yd) Discharge Instruction: Secure with tape as directed. Compression Wrap Compression Stockings Add-Ons Electronic Signature(s) Signed: 06/20/2022 5:05:53 PM By: Dellie Catholic RN Entered By: Dellie Catholic on 06/20/2022 16:17:28 -------------------------------------------------------------------------------- Wound Assessment Details Patient Name: Date of Service: Edward [name], [name] 06/20/2022 2:30 PM Medical Record Number: [id number] Patient Account Number: [id number] Date of Birth/Sex: Treating RN: 1971/03/26 (51 y.o. [name] Primary Care Tamra Koos: [name] Other Clinician: Referring Raveen Wieseler: Treating Bowie Doiron/Extender: Kevan Ny in Treatment: 5 Wound Status Wound Number: 75 Primary Diabetic Wound/Ulcer of the Lower Extremity Etiology: Wound Location: Left, Circumferential Lower Leg Wound Open Wounding Event: Gradually Appeared Status: Date Acquired: 06/18/2022 Comorbid Cataracts, Anemia, Sleep Apnea, Arrhythmia, Congestive  Heart Weeks Of Treatment: 0 History: Failure, Coronary Artery Disease, Hypertension, Myocardial Clustered Wound: No Infarction, Peripheral Venous Disease, Type II Diabetes, End Stage Renal Disease, Osteoarthritis, Osteomyelitis, Neuropathy, Confinement Anxiety Photos Wound Measurements Length: (cm) 18.5 Width: (cm) 16 Depth: (cm) 0.1 Area: (cm) 232.478 Volume: (cm) 23.248 JEWETT, MCGANN ([id number]) Wound Description Classification: Grade 2 Exudate Amount: Medium Exudate Type: Serosanguineous Exudate Color: red, brown Foul Odor After Cleansing: No Slough/Fibrino Yes % Reduction in Area: % Reduction in Volume: Tunneling: No Undermining: No [id number] Page 10 of 15 Wound Bed Granulation Amount: Medium (34-66%) Exposed Structure Granulation Quality: Pink Fascia Exposed: No Necrotic Amount: Medium (34-66%) Fat Layer (Subcutaneous Tissue) Exposed: Yes Necrotic Quality: Eschar, Adherent Slough Tendon Exposed: No Muscle Exposed: No Joint Exposed: No Bone Exposed: No Periwound Skin Texture Texture Color No Abnormalities Noted: No No Abnormalities Noted: No Moisture No Abnormalities Noted: No Treatment Notes Wound #13 (Lower Leg) Wound Laterality: Left, Circumferential Cleanser Soap and Water Discharge Instruction: May shower and wash wound with dial antibacterial soap and water prior to dressing change. Peri-Wound Care Sween Lotion (Moisturizing lotion) Discharge Instruction: Apply moisturizing lotion as directed Topical Primary Dressing IODOFLEX 0.9% Cadexomer Iodine Pad 4x6 cm Discharge Instruction: Use Iodosorb if Iodoflex not available Secondary Dressing ABD Pad, 5x9 Discharge Instruction: Apply over primary dressing as directed. Woven Gauze Sponge, Non-Sterile 4x4 in Discharge Instruction: Apply over primary dressing as directed. Secured With Elastic Bandage 4 inch (ACE bandage) Discharge Instruction: Secure with ACE bandage as  directed. Kerlix Roll Sterile, 4.5x3.1 (in/yd) Discharge Instruction: Secure with Kerlix as directed. 52M Medipore H Soft Cloth Surgical T ape, 4 x 10 (in/yd)  Discharge Instruction: Secure with tape as directed. Compression Wrap Compression Stockings Add-Ons Electronic Signature(s) Signed: 06/20/2022 5:05:53 PM By: Dellie Catholic RN Entered By: Dellie Catholic on 06/20/2022 15:53:23 Wound Assessment Details -------------------------------------------------------------------------------- [name] ([id number]) [id number] Page 11 of 15 Patient Name: Date of Service: Edward [name] MES L. 06/20/2022 2:30 PM Medical Record Number: [id number] Patient Account Number: [id number] Date of Birth/Sex: Treating RN: [birth year] (51 y.o. [name] Primary Care Vyla Pint: [name] Other Clinician: Referring Mehdi Gironda: Treating Emiliano Welshans/Extender: Kevan Ny in Treatment: 5 Wound Status Wound Number: 14 Primary Diabetic Wound/Ulcer of the Lower Extremity Etiology: Wound Location: Left, Dorsal Foot Wound Open Wounding Event: Gradually Appeared Status: Date Acquired: 06/18/2022 Comorbid Cataracts, Anemia, Sleep Apnea, Arrhythmia, Congestive Heart Weeks Of Treatment: 0 History: Failure, Coronary Artery Disease, Hypertension, Myocardial Clustered Wound: No Infarction, Peripheral Venous Disease, Type II Diabetes, End Stage Renal Disease, Osteoarthritis, Osteomyelitis, Neuropathy, Confinement Anxiety Photos Wound Measurements Length: (cm) 1.1 % Reduction in Area: Width: (cm) 1.2 % Reduction in Volume: Depth: (cm) 0.2 Epithelialization: None Area: (cm) 1.037 Tunneling: No Volume: (cm) 0.207 Undermining: No Wound Description Classification: Grade 2 Exudate Amount: Medium Exudate Type: Serosanguineous Exudate Color: red, brown Wound Bed Granulation Amount: Small (1-33%) Exposed Structure Granulation Quality: Pink Fascia  Exposed: No Necrotic Amount: Large (67-100%) Fat Layer (Subcutaneous Tissue) Exposed: Yes Necrotic Quality: Eschar, Adherent Slough Tendon Exposed: No Muscle Exposed: No Joint Exposed: No Bone Exposed: No Periwound Skin Texture Texture Color No Abnormalities Noted: No No Abnormalities Noted: No Moisture No Abnormalities Noted: No Treatment Notes Wound #14 (Foot) Wound Laterality: Dorsal, Left Cleanser Soap and Water Discharge Instruction: May shower and wash wound with dial antibacterial soap and water prior to dressing change. Peri-Wound Care Sween Lotion (Moisturizing lotion) Discharge Instruction: Apply moisturizing lotion as directed Topical [facility], [facility] ([id number]) [phone].pdf Page 12 of 15 Primary Dressing IODOFLEX 0.9% Cadexomer Iodine Pad 4x6 cm Discharge Instruction: Use Iodosorb if Iodoflex not available Secondary Dressing ABD Pad, 5x9 Discharge Instruction: Apply over primary dressing as directed. Woven Gauze Sponge, Non-Sterile 4x4 in Discharge Instruction: Apply over primary dressing as directed. Secured With Elastic Bandage 4 inch (ACE bandage) Discharge Instruction: Secure with ACE bandage as directed. Kerlix Roll Sterile, 4.5x3.1 (in/yd) Discharge Instruction: Secure with Kerlix as directed. 53M Medipore H Soft Cloth Surgical T ape, 4 x 10 (in/yd) Discharge Instruction: Secure with tape as directed. Compression Wrap Compression Stockings Add-Ons Electronic Signature(s) Signed: 06/20/2022 5:05:53 PM By: Dellie Catholic RN Entered By: Dellie Catholic on 06/20/2022 15:53:44 -------------------------------------------------------------------------------- Wound Assessment Details Patient Name: Date of Service: Edward [name], [name] 06/20/2022 2:30 PM Medical Record Number: [id number] Patient Account Number: [id number] Date of Birth/Sex: Treating RN: 1971-07-08 (51 y.o. [name] Primary Care Joud Ingwersen: [name] Other  Clinician: Referring Jonh Mcqueary: Treating Berneda Piccininni/Extender: Kevan Ny in Treatment: 5 Wound Status Wound Number: 15 Primary Diabetic Wound/Ulcer of the Lower Extremity Etiology: Wound Location: Left, Proximal, Anterior Lower Leg Wound Open Wounding Event: Gradually Appeared Status: Date Acquired: 06/18/2022 Comorbid Cataracts, Anemia, Sleep Apnea, Arrhythmia, Congestive Heart Weeks Of Treatment: 0 History: Failure, Coronary Artery Disease, Hypertension, Myocardial Clustered Wound: No Infarction, Peripheral Venous Disease, Type II Diabetes, End Stage Renal Disease, Osteoarthritis, Osteomyelitis, Neuropathy, Confinement Anxiety Photos Wound Measurements Length: (cm) 1.5 Width: (cm) 2 Depth: (cm) 0.1 [place], Icarus L ([id number]) Area: (cm) 2.356 Volume: (cm) 0.236 % Reduction in Area: % Reduction in Volume: Tunneling: No [id number] Page 13 of 15 Undermining: No Wound  Description Classification: Grade 2 Exudate Amount: Medium Exudate Type: Serosanguineous Exudate Color: red, brown Foul Odor After Cleansing: No Slough/Fibrino Yes Wound Bed Granulation Amount: Large (67-100%) Exposed Structure Granulation Quality: Pink Fascia Exposed: No Necrotic Amount: Small (1-33%) Fat Layer (Subcutaneous Tissue) Exposed: Yes Necrotic Quality: Eschar, Adherent Slough Tendon Exposed: No Muscle Exposed: No Joint Exposed: No Bone Exposed: No Periwound Skin Texture Texture Color No Abnormalities Noted: No No Abnormalities Noted: No Moisture No Abnormalities Noted: No Treatment Notes Wound #15 (Lower Leg) Wound Laterality: Left, Anterior, Proximal Cleanser Soap and Water Discharge Instruction: May shower and wash wound with dial antibacterial soap and water prior to dressing change. Peri-Wound Care Sween Lotion (Moisturizing lotion) Discharge Instruction: Apply moisturizing lotion as directed Topical Primary  Dressing IODOFLEX 0.9% Cadexomer Iodine Pad 4x6 cm Discharge Instruction: Use Iodosorb if Iodoflex not available Secondary Dressing ABD Pad, 5x9 Discharge Instruction: Apply over primary dressing as directed. Woven Gauze Sponge, Non-Sterile 4x4 in Discharge Instruction: Apply over primary dressing as directed. Secured With Elastic Bandage 4 inch (ACE bandage) Discharge Instruction: Secure with ACE bandage as directed. Kerlix Roll Sterile, 4.5x3.1 (in/yd) Discharge Instruction: Secure with Kerlix as directed. 42M Medipore H Soft Cloth Surgical T ape, 4 x 10 (in/yd) Discharge Instruction: Secure with tape as directed. Compression Wrap Compression Stockings Add-Ons Electronic Signature(s) Signed: 06/20/2022 5:05:53 PM By: Dellie Catholic RN Entered By: Dellie Catholic on 06/20/2022 15:53:57 [name] ([id number]) [id number] Page 14 of 15 -------------------------------------------------------------------------------- Wound Assessment Details Patient Name: Date of Service: Edward [name] MES L. 06/20/2022 2:30 PM Medical Record Number: [id number] Patient Account Number: [id number] Date of Birth/Sex: Treating RN: Dec 22, 1970 (51 y.o. [name] Primary Care Miana Politte: [name] Other Clinician: Referring Kaiah Hosea: Treating Jozlynn Plaia/Extender: Kevan Ny in Treatment: 5 Wound Status Wound Number: 16 Primary Diabetic Wound/Ulcer of the Lower Extremity Etiology: Wound Location: Left, Distal, Medial Lower Leg Wound Open Wounding Event: Gradually Appeared Status: Date Acquired: 06/18/2022 Comorbid Cataracts, Anemia, Sleep Apnea, Arrhythmia, Congestive Heart Weeks Of Treatment: 0 History: Failure, Coronary Artery Disease, Hypertension, Myocardial Clustered Wound: Yes Infarction, Peripheral Venous Disease, Type II Diabetes, End Stage Renal Disease, Osteoarthritis, Osteomyelitis, Neuropathy, Confinement  Anxiety Photos Wound Measurements Length: (cm) 3 Width: (cm) 1 Depth: (cm) 0.1 Area: (cm) 2.356 Volume: (cm) 0.236 % Reduction in Area: % Reduction in Volume: Tunneling: No Undermining: No Wound Description Classification: Grade 2 Exudate Amount: Medium Exudate Type: Serosanguineous Exudate Color: red, brown Foul Odor After Cleansing: No Slough/Fibrino Yes Wound Bed Granulation Amount: Small (1-33%) Exposed Structure Granulation Quality: Pink Fascia Exposed: No Necrotic Amount: Large (67-100%) Fat Layer (Subcutaneous Tissue) Exposed: Yes Necrotic Quality: Eschar, Adherent Slough Tendon Exposed: No Muscle Exposed: No Joint Exposed: No Bone Exposed: No Periwound Skin Texture Texture Color No Abnormalities Noted: No No Abnormalities Noted: No Moisture No Abnormalities Noted: No Treatment Notes Wound #16 (Lower Leg) Wound Laterality: Left, Medial, Distal Cleanser Soap and Water TERRIN, MEDDAUGH ([id number]) [id number] Page 15 of 15 Discharge Instruction: May shower and wash wound with dial antibacterial soap and water prior to dressing change. Peri-Wound Care Sween Lotion (Moisturizing lotion) Discharge Instruction: Apply moisturizing lotion as directed Topical Primary Dressing IODOFLEX 0.9% Cadexomer Iodine Pad 4x6 cm Discharge Instruction: Use Iodosorb if Iodoflex not available Secondary Dressing ABD Pad, 5x9 Discharge Instruction: Apply over primary dressing as directed. Woven Gauze Sponge, Non-Sterile 4x4 in Discharge Instruction: Apply over primary dressing as directed. Secured With Elastic Bandage 4 inch (ACE bandage) Discharge Instruction: Secure with ACE bandage as  directed. Kerlix Roll Sterile, 4.5x3.1 (in/yd) Discharge Instruction: Secure with Kerlix as directed. 104M Medipore H Soft Cloth Surgical T ape, 4 x 10 (in/yd) Discharge Instruction: Secure with tape as directed. Compression Wrap Compression  Stockings Add-Ons Electronic Signature(s) Signed: 06/20/2022 5:05:53 PM By: Dellie Catholic RN Entered By: Dellie Catholic on 06/20/2022 16:10:03 -------------------------------------------------------------------------------- Vitals Details Patient Name: Date of Service: Edward [name], [name] 06/20/2022 2:30 PM Medical Record Number: [id number] Patient Account Number: [id number] Date of Birth/Sex: Treating RN: Jul 02, 1971 (51 y.o. [name] Primary Care Brooklynne Pereida: [name] Other Clinician: Referring Jaylie Neaves: Treating Purl Claytor/Extender: Cecile Hearing Weeks in Treatment: 5 Vital Signs Time Taken: 15:00 Temperature (F): 98.5 Pulse (bpm): 80 Respiratory Rate (breaths/min): 20 Blood Pressure (mmHg): 117/67 Reference Range: 80 - 120 mg / dl Electronic Signature(s) Signed: 06/20/2022 3:30:06 PM By: Worthy Rancher Entered By: Worthy Rancher on 06/20/2022 15:12:14

## 2022-06-21 ENCOUNTER — Encounter (HOSPITAL_BASED_OUTPATIENT_CLINIC_OR_DEPARTMENT_OTHER): Payer: Medicare Other | Admitting: General Surgery

## 2022-06-21 DIAGNOSIS — I96 Gangrene, not elsewhere classified: Secondary | ICD-10-CM | POA: Diagnosis not present

## 2022-06-21 DIAGNOSIS — S81001A Unspecified open wound, right knee, initial encounter: Secondary | ICD-10-CM | POA: Diagnosis not present

## 2022-06-21 DIAGNOSIS — I998 Other disorder of circulatory system: Secondary | ICD-10-CM | POA: Diagnosis not present

## 2022-06-21 DIAGNOSIS — L02519 Cutaneous abscess of unspecified hand: Secondary | ICD-10-CM | POA: Diagnosis not present

## 2022-06-21 DIAGNOSIS — S81802A Unspecified open wound, left lower leg, initial encounter: Secondary | ICD-10-CM | POA: Diagnosis not present

## 2022-06-21 DIAGNOSIS — S68119D Complete traumatic metacarpophalangeal amputation of unspecified finger, subsequent encounter: Secondary | ICD-10-CM | POA: Diagnosis not present

## 2022-06-21 DIAGNOSIS — M79645 Pain in left finger(s): Secondary | ICD-10-CM | POA: Diagnosis not present

## 2022-06-21 DIAGNOSIS — M79644 Pain in right finger(s): Secondary | ICD-10-CM | POA: Diagnosis not present

## 2022-06-21 DIAGNOSIS — M25641 Stiffness of right hand, not elsewhere classified: Secondary | ICD-10-CM | POA: Diagnosis not present

## 2022-06-21 DIAGNOSIS — T148XXA Other injury of unspecified body region, initial encounter: Secondary | ICD-10-CM | POA: Diagnosis not present

## 2022-06-21 NOTE — Progress Notes (Signed)
TRAYON, KRANTZ ([id number]) [id number] Page 1 of 19 Visit Report for 06/20/2022 Chief Complaint Document Details Patient Name: Date of Service: Edward [name] MES L. 06/20/2022 2:30 PM Medical Record Number: [id number] Patient Account Number: [id number] Date of Birth/Sex: Treating RN: 06-Dec-1970 (51 y.o. [name] Primary Care Provider: [name] Other Clinician: Referring Provider: Treating Provider/Extender: Kevan Ny in Treatment: 5 Information Obtained from: Patient Chief Complaint Patients presents for treatment of an open diabetic ulcer Electronic Signature(s) Signed: 06/20/2022 4:17:59 PM By: Fredirick Maudlin MD FACS Entered By: Fredirick Maudlin on 06/20/2022 16:17:59 -------------------------------------------------------------------------------- Debridement Details Patient Name: Date of Service: Edward [name], [name] 06/20/2022 2:30 PM Medical Record Number: [id number] Patient Account Number: [id number] Date of Birth/Sex: Treating RN: February 22, 1971 (51 y.o. [name] Primary Care Provider: [name] Other Clinician: Referring Provider: Treating Provider/Extender: Kevan Ny in Treatment: 5 Debridement Performed for Assessment: Wound #15 Left,Proximal,Anterior Lower Leg Performed By: Physician Fredirick Maudlin, MD Debridement Type: Debridement Severity of Tissue Pre Debridement: Fat layer exposed Level of Consciousness (Pre-procedure): Awake and Alert Pre-procedure Verification/Time Out Yes - 15:50 Taken: Start Time: 15:50 Pain Control: Lidocaine 5% topical ointment T Area Debrided (L x W): otal 1.5 (cm) x 2 (cm) = 3 (cm) Tissue and other material debrided: Non-Viable, Eschar, Slough, Subcutaneous, Slough Level: Skin/Subcutaneous Tissue Debridement Description: Excisional Instrument: Curette Bleeding: Minimum Hemostasis Achieved: Pressure End Time: 15:52 Procedural  Pain: 0 Post Procedural Pain: 0 Response to Treatment: Procedure was tolerated well Level of Consciousness (Post- Awake and Alert procedure): Post Debridement Measurements of Total Wound Length: (cm) 1.5 Width: (cm) 2 Depth: (cm) 0.1 Volume: (cm) 0.236 Character of Wound/Ulcer Post Debridement: Improved Severity of Tissue Post Debridement: Fat layer exposed DAMONTA, [name] ([id number]) [id number] Page 2 of 19 Post Procedure Diagnosis Same as Pre-procedure Notes Scribed for Dr. Celine Ahr by J.Scotton Electronic Signature(s) Signed: 06/20/2022 4:34:52 PM By: Fredirick Maudlin MD FACS Signed: 06/20/2022 5:05:53 PM By: Dellie Catholic RN Entered By: Dellie Catholic on 06/20/2022 15:52:48 -------------------------------------------------------------------------------- Debridement Details Patient Name: Date of Service: Edward [name], [name] 06/20/2022 2:30 PM Medical Record Number: [id number] Patient Account Number: [id number] Date of Birth/Sex: Treating RN: 1970/08/29 (51 y.o. [name] Primary Care Provider: [name] Other Clinician: Referring Provider: Treating Provider/Extender: Kevan Ny in Treatment: 5 Debridement Performed for Assessment: Wound #12 Left Achilles Performed By: Physician Fredirick Maudlin, MD Debridement Type: Debridement Severity of Tissue Pre Debridement: Fat layer exposed Level of Consciousness (Pre-procedure): Awake and Alert Pre-procedure Verification/Time Out Yes - 15:50 Taken: Start Time: 15:50 Pain Control: Lidocaine 5% topical ointment T Area Debrided (L x W): otal 3 (cm) x 2.2 (cm) = 6.6 (cm) Tissue and other material debrided: Non-Viable, Eschar, Slough, Subcutaneous, Slough Level: Skin/Subcutaneous Tissue Debridement Description: Excisional Instrument: Curette Bleeding: Minimum Hemostasis Achieved: Pressure End Time: 15:52 Procedural Pain: 0 Post Procedural Pain: 0 Response to  Treatment: Procedure was tolerated well Level of Consciousness (Post- Awake and Alert procedure): Post Debridement Measurements of Total Wound Length: (cm) 3 Width: (cm) 2.2 Depth: (cm) 0.1 Volume: (cm) 0.518 Character of Wound/Ulcer Post Debridement: Improved Severity of Tissue Post Debridement: Fat layer exposed Post Procedure Diagnosis Same as Pre-procedure Notes Scribed for Dr. Celine Ahr by J.Scotton Electronic Signature(s) Signed: 06/20/2022 4:34:52 PM By: Fredirick Maudlin MD FACS Signed: 06/20/2022 5:05:53 PM By: Dellie Catholic RN Entered By: Dellie Catholic on 06/20/2022 Barrington, Archer L ([id number]) [id number] Page 3 of 19 --------------------------------------------------------------------------------  Debridement Details Patient Name: Date of Service: Edward [name] MES L. 06/20/2022 2:30 PM Medical Record Number: [id number] Patient Account Number: [id number] Date of Birth/Sex: Treating RN: 06-22-1971 (51 y.o. [name] Primary Care Provider: [name] Other Clinician: Referring Provider: Treating Provider/Extender: Kevan Ny in Treatment: 5 Debridement Performed for Assessment: Wound #11 Left,Anterior Lower Leg Performed By: Physician Fredirick Maudlin, MD Debridement Type: Debridement Severity of Tissue Pre Debridement: Fat layer exposed Level of Consciousness (Pre-procedure): Awake and Alert Pre-procedure Verification/Time Out Yes - 15:50 Taken: Start Time: 15:50 Pain Control: Lidocaine 5% topical ointment T Area Debrided (L x W): otal 5.6 (cm) x 8 (cm) = 44.8 (cm) Tissue and other material debrided: Non-Viable, Eschar, Slough, Subcutaneous, Slough Level: Skin/Subcutaneous Tissue Debridement Description: Excisional Instrument: Curette Bleeding: Minimum Hemostasis Achieved: Pressure End Time: 15:52 Procedural Pain: 0 Post Procedural Pain: 0 Response to Treatment: Procedure was tolerated  well Level of Consciousness (Post- Awake and Alert procedure): Post Debridement Measurements of Total Wound Length: (cm) 5.6 Width: (cm) 8 Depth: (cm) 0.1 Volume: (cm) 3.519 Character of Wound/Ulcer Post Debridement: Improved Severity of Tissue Post Debridement: Fat layer exposed Post Procedure Diagnosis Same as Pre-procedure Notes Scribed for Dr. Celine Ahr by J.S. Electronic Signature(s) Signed: 06/20/2022 4:34:52 PM By: Fredirick Maudlin MD FACS Signed: 06/20/2022 5:05:53 PM By: Dellie Catholic RN Entered By: Dellie Catholic on 06/20/2022 16:11:19 -------------------------------------------------------------------------------- Debridement Details Patient Name: Date of Service: Edward [name], [name] 06/20/2022 2:30 PM Medical Record Number: [id number] Patient Account Number: [id number] Date of Birth/Sex: Treating RN: Jul 24, 1971 (51 y.o. [name] Primary Care Provider: [name] Other Clinician: Referring Provider: Treating Provider/Extender: Kevan Ny in Treatment: 5 Debridement Performed for Assessment: Wound #14 Left,Dorsal Foot Performed By: Physician Fredirick Maudlin, MD Debridement Type: Debridement DEEN, DEGUIA ([id number]) [id number] Page 4 of 19 Severity of Tissue Pre Debridement: Fat layer exposed Level of Consciousness (Pre-procedure): Awake and Alert Pre-procedure Verification/Time Out Yes - 15:50 Taken: Start Time: 15:50 Pain Control: Lidocaine 5% topical ointment T Area Debrided (L x W): otal 1.1 (cm) x 1.2 (cm) = 1.32 (cm) Tissue and other material debrided: Non-Viable, Eschar, Slough, Subcutaneous, Slough Level: Skin/Subcutaneous Tissue Debridement Description: Excisional Instrument: Curette Bleeding: Minimum Hemostasis Achieved: Pressure End Time: 15:52 Procedural Pain: 0 Post Procedural Pain: 0 Response to Treatment: Procedure was tolerated well Level of Consciousness (Post- Awake and  Alert procedure): Post Debridement Measurements of Total Wound Length: (cm) 1.1 Width: (cm) 1.2 Depth: (cm) 0.2 Volume: (cm) 0.207 Character of Wound/Ulcer Post Debridement: Improved Severity of Tissue Post Debridement: Fat layer exposed Post Procedure Diagnosis Same as Pre-procedure Notes Scribed for Dr. Celine Ahr by J.Scotton Electronic Signature(s) Signed: 06/20/2022 4:34:52 PM By: Fredirick Maudlin MD FACS Signed: 06/20/2022 5:05:53 PM By: Dellie Catholic RN Entered By: Dellie Catholic on 06/20/2022 16:12:17 -------------------------------------------------------------------------------- Debridement Details Patient Name: Date of Service: Edward [name], [name] 06/20/2022 2:30 PM Medical Record Number: [id number] Patient Account Number: [id number] Date of Birth/Sex: Treating RN: 1971-04-13 (51 y.o. [name] Primary Care Provider: [name] Other Clinician: Referring Provider: Treating Provider/Extender: Kevan Ny in Treatment: 5 Debridement Performed for Assessment: Wound #13 Left,Circumferential Lower Leg Performed By: Physician Fredirick Maudlin, MD Debridement Type: Debridement Severity of Tissue Pre Debridement: Fat layer exposed Level of Consciousness (Pre-procedure): Awake and Alert Pre-procedure Verification/Time Out Yes - 15:50 Taken: Start Time: 15:50 Pain Control: Lidocaine 5% topical ointment T Area Debrided (L x W): otal 18.5 (  cm) x 16 (cm) = 296 (cm) Tissue and other material debrided: Non-Viable, Eschar, Slough, Subcutaneous, Slough Level: Skin/Subcutaneous Tissue Debridement Description: Excisional Instrument: Curette Bleeding: Minimum Hemostasis Achieved: Pressure End Time: 15:52 Procedural Pain: 0 Post Procedural Pain: 0 Response to Treatment: Procedure was tolerated well Level of Consciousness (Post- Awake and Alert JERMY, COUPER ([id number]) [id number] Page 5 of 19 Awake and  Alert procedure): Post Debridement Measurements of Total Wound Length: (cm) 18.5 Width: (cm) 16 Depth: (cm) 0.1 Volume: (cm) 23.248 Character of Wound/Ulcer Post Debridement: Improved Severity of Tissue Post Debridement: Fat layer exposed Post Procedure Diagnosis Same as Pre-procedure Notes Scribed for Dr. Celine Ahr by J.S. Electronic Signature(s) Signed: 06/20/2022 4:34:52 PM By: Fredirick Maudlin MD FACS Signed: 06/20/2022 5:05:53 PM By: Dellie Catholic RN Entered By: Dellie Catholic on 06/20/2022 16:13:19 -------------------------------------------------------------------------------- Debridement Details Patient Name: Date of Service: Edward [name], [name] 06/20/2022 2:30 PM Medical Record Number: [id number] Patient Account Number: [id number] Date of Birth/Sex: Treating RN: 01-31-1971 (51 y.o. [name] Primary Care Provider: [name] Other Clinician: Referring Provider: Treating Provider/Extender: Kevan Ny in Treatment: 5 Debridement Performed for Assessment: Wound #16 Left,Distal,Medial Lower Leg Performed By: Physician Fredirick Maudlin, MD Debridement Type: Debridement Severity of Tissue Pre Debridement: Fat layer exposed Level of Consciousness (Pre-procedure): Awake and Alert Pre-procedure Verification/Time Out Yes - 15:50 Taken: Start Time: 15:50 Pain Control: Lidocaine 5% topical ointment T Area Debrided (L x W): otal 3 (cm) x 1 (cm) = 3 (cm) Tissue and other material debrided: Non-Viable, Eschar, Slough, Subcutaneous, Slough Level: Skin/Subcutaneous Tissue Debridement Description: Excisional Instrument: Curette Specimen: Tissue Culture Number of Specimens T aken: 1 Bleeding: Minimum Hemostasis Achieved: Pressure End Time: 15:52 Procedural Pain: 0 Post Procedural Pain: 0 Response to Treatment: Procedure was tolerated well Level of Consciousness (Post- Awake and Alert procedure): Post Debridement Measurements of Total  Wound Length: (cm) 3 Width: (cm) 1 Depth: (cm) 0.1 Volume: (cm) 0.236 Character of Wound/Ulcer Post Debridement: Improved Severity of Tissue Post Debridement: Fat layer exposed Post Procedure Diagnosis Same as Pre-procedure Notes MALAKHAI, BEITLER ([id number]) [id number] Page 6 of 19 Scribed for Dr. Celine Ahr by J.S. Electronic Signature(s) Signed: 06/20/2022 4:34:52 PM By: Fredirick Maudlin MD FACS Signed: 06/20/2022 5:05:53 PM By: Dellie Catholic RN Entered By: Dellie Catholic on 06/20/2022 16:14:41 -------------------------------------------------------------------------------- HPI Details Patient Name: Date of Service: Edward [name], [name] 06/20/2022 2:30 PM Medical Record Number: [id number] Patient Account Number: [id number] Date of Birth/Sex: Treating RN: 1971-05-02 (51 y.o. [name] Primary Care Provider: [name] Other Clinician: Referring Provider: Treating Provider/Extender: Kevan Ny in Treatment: 5 History of Present Illness HPI Description: Admission 09/24/2021 Mr. 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 He developed cellulitis to the left fourth finger and was treated with doxycycline at urgent care. The nail is coming off. He reports taking the antibiotics currently. He has been keeping the area covered. He also presents with 2 left lower extremity wounds. He reports that the more proximal wound has been present for several months and the more distal wound has developed more recently over the past several weeks. He had an abdominal aortogram to the left lower extremity with laser atherectomy to the left popliteal, left tibioperoneal trunk and left peroneal arteries, drug-coated balloon angioplasty of the left popliteal  artery and plain balloon angioplasty of the left tibioperoneal trunk and left peroneal artery. He has not followed up with vein and vascular since his procedure on 04/09/2021. He currently denies signs of infection. He currently denies pain. 11/28/2021: The patient has not been seen since his initial admission. He has not followed up with vascular surgery, either. He did undergo amputation of his left fourth finger and is currently under the care of orthopedics and occupational therapy for this. He presents today with multiple wounds on his bilateral lower extremities. He is very fluid overloaded, based upon the appearance of his extremities. These all seem to have started as blisters that have subsequently opened and caused ulcers. He does have a shrinker sock for his right BKA, but he is unable to wear it secondary to pain. He also has a compression stocking for his left lower extremity but he has been unable to apply it due to the amputation on his left hand. The largest and most painful of these wounds is just above the knee on the right. All of the wounds have eschar and some slough present. No overt signs of infection. 12/05/2021: All of the wounds look about the same. He has new blisters that have opened up revealing denuded skin underneath. The eschar is extremely hard and where there is not eschar, the wounds are gritty. He was unable to tolerate the Ace wrap at the tightness that we applied it last week and ended up having to have it loosened. He has been tolerating Kerlix and Coban on the left leg. He has not received a follow-up appointment with vascular surgery yet. We have been using Santyl under Hydrofera Blue. 12/12/2021: We have been unable to secure home health care assistance. The patient and his son are doing his wound care at home. He does have a follow-up visit in vascular surgery on May 24. He appears to be less fluid overloaded today. The wound on his right medial thigh looks bigger  today and has a thick layer of hard dark eschar. The wound on his lateral right stump looks somewhat improved with softer eschar and slough. There is also slough on the wound overlying the end of the stump and wrapping around to the backside of it. On his left leg, the edema control is improved. The wound on his anterior tibial surface is small and clean. On his dorsal and medial ankle, there is slough buildup. 12/19/2021: The wound on his left anterior tibial surface is closed. The wound on his dorsal left foot looks about the same with just a bit of slough buildup. The surface is gritty. The wound on his right medial thigh looks about the same today and has reaccumulated the hard dark eschar, despite vigorous debridement last week. The wound on the right lateral stump is about the same and has less eschar and slough. Overlying the end of the stump and wrapping around the backside of it, the wounds have converged. Overall, his edema control is much better than it has been and certainly than it was on the first visit. 12/26/2021: The wound on his dorsal left foot has a bit of slough accumulation but the surface is less gritty. The wound on his left medial calf is clean with a good bit of granulation tissue. The wounds on his right thigh and stump continue to display a heavy thick layer of black eschar. 01/01/2022: The wound on his dorsal left foot is fairly clean but still has a gritty surface. The wound  on his left medial calf is nearly closed with just a little bit of overlying eschar. Unfortunately, the wounds on his right thigh and stump have deteriorated. 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 He has a fluctuant area near the proximal part of his medial thigh wound. There is an odor coming from that leg but I am not sure which of the wounds is producing it or if both of them are. READMISSION 05/15/2022 Since he was last seen in our clinic, the  patient has undergone multiple amputations of various fingers as well as revision of his right BKA to an above-knee amputation. He is now back for further evaluation and management of 3 small wounds on his left lower leg, just above the ankle on the anterior tibial surface. They are very fibrotic and dry. Muscle fibers are visible in the most proximal wound. There is slough and leathery eschar accumulation present. 06/20/2022: Edward [name] returns to clinic after having been in the hospital for quite some time. He has returned with multiple new wounds. The largest of these is a nearly circumferential wound on the mid to lower portion of his left lower leg. He has wounds on his Achilles tendon area with muscle and tendon exposed. The anterior ankle wound has thick black eschar accumulated on the surface. The dorsal foot wound also has slough and eschar buildup. All of the wound surfaces are incredibly fibrotic and do not look particularly viable. Electronic Signature(s) Signed: 06/20/2022 4:22:28 PM By: Fredirick Maudlin MD FACS Entered By: Fredirick Maudlin on 06/20/2022 16:22:28 QUINCY, BOY ([id number]) [id number] Page 7 of 19 -------------------------------------------------------------------------------- Physical Exam Details Patient Name: Date of Service: Edward [name] MES L. 06/20/2022 2:30 PM Medical Record Number: [id number] Patient Account Number: [id number] Date of Birth/Sex: Treating RN: 1971-04-01 (51 y.o. [name] Primary Care Provider: [name] Other Clinician: Referring Provider: Treating Provider/Extender: Cecile Hearing Weeks in Treatment: 5 Constitutional . . . . No acute distress.Marland Kitchen Respiratory Normal work of breathing on room air.. Notes 06/20/2022: He has multiple new wounds. The largest of these is a nearly circumferential wound on the mid to lower portion of his left lower leg. He has wounds on his Achilles tendon  area with muscle and tendon exposed. The anterior ankle wound has thick black eschar accumulated on the surface. The dorsal foot wound also has slough and eschar buildup. All of the wound surfaces are incredibly fibrotic and do not look particularly viable. Electronic Signature(s) Signed: 06/20/2022 4:23:33 PM By: Fredirick Maudlin MD FACS Entered By: Fredirick Maudlin on 06/20/2022 16:23:32 -------------------------------------------------------------------------------- Physician Orders Details Patient Name: Date of Service: Edward [name], [name] 06/20/2022 2:30 PM Medical Record Number: [id number] Patient Account Number: [id number] Date of Birth/Sex: Treating RN: [birth year] (52 y.o. 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 without evidence of necrosis N18.6 End stage renal disease Z89.611 Acquired absence of right leg above knee Z89.432 Acquired absence of left foot I50.42 Chronic combined systolic (congestive) and diastolic (congestive) heart failure I73.9 Peripheral vascular disease, unspecified E11.52 Type 2 diabetes mellitus with diabetic peripheral angiopathy with gangrene L97.522 Non-pressure chronic ulcer of other part of left foot with fat layer exposed L97.325 Non-pressure  chronic ulcer of left ankle with muscle involvement without evidence of necrosis L97.822 Non-pressure chronic ulcer of other part of left lower leg with fat layer exposed Follow-up Appointments ppointment in 1 week. - Dr. Celine Ahr Rm 3 Return A Anesthetic Wound #11 Left,Anterior Lower Leg (In clinic) Topical Lidocaine 5% applied to wound bed GABERIAL, CADA ([id number]) [phone].pdf Page 8  of 19 Bathing/ Shower/ Hygiene May shower and wash wound with soap and water. - use antibacterial soap to wash legs and wounds when changing dressing. right leg Edema Control - Lymphedema / SCD / Other Elevate legs to the level of the heart or above for 30 minutes daily and/or when sitting, a frequency of: - throughout the day Avoid standing for long periods of time. Additional Orders / Instructions Follow Nutritious Diet - Monitor/Control Blood Sugars Wound Treatment Wound #11 - Lower Leg Wound Laterality: Left, Anterior Cleanser: Soap and Water Every Other Day/30 Days Discharge Instructions: May shower and wash wound with dial antibacterial soap and water prior to dressing change. Peri-Wound Care: Sween Lotion (Moisturizing lotion) Every Other Day/30 Days Discharge Instructions: Apply moisturizing lotion as directed Prim Dressing: IODOFLEX 0.9% Cadexomer Iodine Pad 4x6 cm (DME) (Generic) Every Other Day/30 Days ary Discharge Instructions: Use Iodosorb if Iodoflex not available Secondary Dressing: ABD Pad, 5x9 (DME) (Generic) Every Other Day/30 Days Discharge Instructions: Apply over primary dressing as directed. Secondary Dressing: Woven Gauze Sponge, Non-Sterile 4x4 in (DME) (Generic) Every Other Day/30 Days Discharge Instructions: Apply over primary dressing as directed. Secured With: Elastic Bandage 4 inch (ACE bandage) (DME) (Generic) Every Other Day/30 Days Discharge Instructions: Secure with ACE bandage as directed. Secured With: [employer], 4.5x3.1 (in/yd) (DME) (Generic) Every Other Day/30 Days Discharge Instructions: Secure with Kerlix as directed. Secured With: 79M Medipore H Soft Cloth Surgical T ape, 4 x 10 (in/yd) (DME) (Generic) Every Other Day/30 Days Discharge Instructions: Secure with tape as directed. Wound #12 - Achilles Wound Laterality: Left Cleanser: Soap and Water Every Other Day/30 Days Discharge Instructions: May shower and wash wound with dial  antibacterial soap and water prior to dressing change. Peri-Wound Care: Sween Lotion (Moisturizing lotion) Every Other Day/30 Days Discharge Instructions: Apply moisturizing lotion as directed Prim Dressing: IODOFLEX 0.9% Cadexomer Iodine Pad 4x6 cm (DME) (Generic) Every Other Day/30 Days ary Discharge Instructions: Use Iodosorb if Iodoflex not available Secondary Dressing: ABD Pad, 5x9 (DME) (Generic) Every Other Day/30 Days Discharge Instructions: Apply over primary dressing as directed. Secondary Dressing: Woven Gauze Sponge, Non-Sterile 4x4 in (DME) (Generic) Every Other Day/30 Days Discharge Instructions: Apply over primary dressing as directed. Secured With: Elastic Bandage 4 inch (ACE bandage) (DME) (Generic) Every Other Day/30 Days Discharge Instructions: Secure with ACE bandage as directed. Secured With: [employer], 4.5x3.1 (in/yd) (DME) (Generic) Every Other Day/30 Days Discharge Instructions: Secure with Kerlix as directed. Secured With: 79M Medipore H Soft Cloth Surgical T ape, 4 x 10 (in/yd) (DME) (Generic) Every Other Day/30 Days Discharge Instructions: Secure with tape as directed. Wound #13 - Lower Leg Wound Laterality: Left, Circumferential Cleanser: Soap and Water Every Other Day/30 Days Discharge Instructions: May shower and wash wound with dial antibacterial soap and water prior to dressing change. Peri-Wound Care: Sween Lotion (Moisturizing lotion) Every Other Day/30 Days Discharge Instructions: Apply moisturizing lotion as directed Prim Dressing: IODOFLEX 0.9% Cadexomer Iodine Pad 4x6 cm (DME) (Generic) Every Other Day/30 Days ary Discharge Instructions: Use Iodosorb if Iodoflex not available Secondary Dressing: ABD Pad, 5x9 (DME) (Generic) Every Other Day/30 Days Discharge Instructions:  Apply over primary dressing as directed. Secondary Dressing: Woven Gauze Sponge, Non-Sterile 4x4 in (DME) (Generic) Every Other Day/30 Days Discharge Instructions: Apply over  primary dressing as directed. YOGI, ARTHER ([id number]) [id number] Page 9 of 19 Secured With: Elastic Bandage 4 inch (ACE bandage) (DME) (Generic) Every Other Day/30 Days Discharge Instructions: Secure with ACE bandage as directed. Secured With: [employer], 4.5x3.1 (in/yd) (DME) (Generic) Every Other Day/30 Days Discharge Instructions: Secure with Kerlix as directed. Secured With: 68M Medipore H Soft Cloth Surgical T ape, 4 x 10 (in/yd) (DME) (Generic) Every Other Day/30 Days Discharge Instructions: Secure with tape as directed. Wound #14 - Foot Wound Laterality: Dorsal, Left Cleanser: Soap and Water Every Other Day/30 Days Discharge Instructions: May shower and wash wound with dial antibacterial soap and water prior to dressing change. Peri-Wound Care: Sween Lotion (Moisturizing lotion) Every Other Day/30 Days Discharge Instructions: Apply moisturizing lotion as directed Prim Dressing: IODOFLEX 0.9% Cadexomer Iodine Pad 4x6 cm (DME) (Generic) Every Other Day/30 Days ary Discharge Instructions: Use Iodosorb if Iodoflex not available Secondary Dressing: ABD Pad, 5x9 (DME) (Generic) Every Other Day/30 Days Discharge Instructions: Apply over primary dressing as directed. Secondary Dressing: Woven Gauze Sponge, Non-Sterile 4x4 in (DME) (Generic) Every Other Day/30 Days Discharge Instructions: Apply over primary dressing as directed. Secured With: Elastic Bandage 4 inch (ACE bandage) (DME) (Generic) Every Other Day/30 Days Discharge Instructions: Secure with ACE bandage as directed. Secured With: [employer], 4.5x3.1 (in/yd) (DME) (Generic) Every Other Day/30 Days Discharge Instructions: Secure with Kerlix as directed. Secured With: 68M Medipore H Soft Cloth Surgical T ape, 4 x 10 (in/yd) (DME) (Generic) Every Other Day/30 Days Discharge Instructions: Secure with tape as directed. Wound #15 - Lower Leg Wound Laterality: Left, Anterior,  Proximal Cleanser: Soap and Water Every Other Day/30 Days Discharge Instructions: May shower and wash wound with dial antibacterial soap and water prior to dressing change. Peri-Wound Care: Sween Lotion (Moisturizing lotion) Every Other Day/30 Days Discharge Instructions: Apply moisturizing lotion as directed Prim Dressing: IODOFLEX 0.9% Cadexomer Iodine Pad 4x6 cm (DME) (Generic) Every Other Day/30 Days ary Discharge Instructions: Use Iodosorb if Iodoflex not available Secondary Dressing: ABD Pad, 5x9 (DME) (Generic) Every Other Day/30 Days Discharge Instructions: Apply over primary dressing as directed. Secondary Dressing: Woven Gauze Sponge, Non-Sterile 4x4 in (DME) (Generic) Every Other Day/30 Days Discharge Instructions: Apply over primary dressing as directed. Secured With: Elastic Bandage 4 inch (ACE bandage) (DME) (Generic) Every Other Day/30 Days Discharge Instructions: Secure with ACE bandage as directed. Secured With: [employer], 4.5x3.1 (in/yd) (DME) (Generic) Every Other Day/30 Days Discharge Instructions: Secure with Kerlix as directed. Secured With: 68M Medipore H Soft Cloth Surgical T ape, 4 x 10 (in/yd) (DME) (Generic) Every Other Day/30 Days Discharge Instructions: Secure with tape as directed. Wound #16 - Lower Leg Wound Laterality: Left, Medial, Distal Cleanser: Soap and Water Every Other Day/30 Days Discharge Instructions: May shower and wash wound with dial antibacterial soap and water prior to dressing change. Peri-Wound Care: Sween Lotion (Moisturizing lotion) Every Other Day/30 Days Discharge Instructions: Apply moisturizing lotion as directed Prim Dressing: IODOFLEX 0.9% Cadexomer Iodine Pad 4x6 cm (DME) (Generic) Every Other Day/30 Days ary Discharge Instructions: Use Iodosorb if Iodoflex not available Secondary Dressing: ABD Pad, 5x9 (DME) (Generic) Every Other Day/30 Days Discharge Instructions: Apply over primary dressing as directed. Secondary  Dressing: Woven Gauze Sponge, Non-Sterile 4x4 in (DME) (Generic) Every Other Day/30 Days Discharge Instructions: Apply over primary dressing as directed. Secured With: Secondary school teacher  4 inch (ACE bandage) (DME) (Generic) Every Other Day/30 Days AFTON, MIKELSON ([id number]) [id number] Page 10 of 19 Discharge Instructions: Secure with ACE bandage as directed. Secured With: [employer], 4.5x3.1 (in/yd) (DME) (Generic) Every Other Day/30 Days Discharge Instructions: Secure with Kerlix as directed. Secured With: 27M Medipore H Soft Cloth Surgical T ape, 4 x 10 (in/yd) (DME) (Generic) Every Other Day/30 Days Discharge Instructions: Secure with tape as directed. Laboratory naerobe culture (MICRO) - Left Medial ankle of Lower leg - (ICD10 [id number] - Non-pressure Bacteria identified in Unspecified specimen by A chronic ulcer of other part of left lower leg with fat layer exposed) LOINC Code: 194-1 Convenience Name: Anaerobic culture Electronic Signature(s) Signed: 06/20/2022 5:05:53 PM By: Dellie Catholic RN Signed: 06/21/2022 7:31:02 AM By: Fredirick Maudlin MD FACS Previous Signature: 06/20/2022 4:34:52 PM Version By: Fredirick Maudlin MD FACS Entered By: Dellie Catholic on 06/20/2022 16:59:28 Prescription 06/20/2022 -------------------------------------------------------------------------------- [name] L. Fredirick Maudlin MD Patient Name: Provider: 07/06/1971 [id number] Date of Birth: NPI#: [name] [id number] Sex: DEA #: [phone] Phone #: License #: [place] Patient Address: [address], [place] [address] [place], [place] [address] [phone] Allergies Crestor; Dilaudid; Benadryl Provider's Orders naerobe culture - ICD10: L97.822 - Left Medial ankle of Lower leg Bacteria identified in Unspecified specimen by A LOINC Code: 654-6 Convenience Name: Anaerobic  culture Hand Signature: Date(s): Electronic Signature(s) Signed: 06/20/2022 5:05:53 PM By: Dellie Catholic RN Signed: 06/21/2022 7:31:02 AM By: Fredirick Maudlin MD FACS Entered By: Dellie Catholic on 06/20/2022 16:59:29 -------------------------------------------------------------------------------- Problem List Details Patient Name: Date of Service: Edward [name], [name] 06/20/2022 2:30 PM Medical Record Number: [id number] Patient Account Number: [id number] MISAEL, MCGAHA ([id number]) [id number] Page 11 of 19 Date of Birth/Sex: Treating RN: Jun 15, 1971 (51 y.o. 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 05/15/2022 No Yes involvement without evidence of necrosis N18.6 End stage renal disease 05/15/2022 No Yes Z89.611 Acquired absence of right leg above knee 05/15/2022 No Yes Z89.432 Acquired absence of left foot 05/15/2022 No Yes I50.42 Chronic combined systolic (congestive) and diastolic (congestive) heart failure 05/15/2022 No Yes I73.9 Peripheral vascular disease, unspecified 05/15/2022 No Yes E11.52 Type 2 diabetes mellitus with diabetic peripheral angiopathy with gangrene 05/15/2022 No Yes L97.522 Non-pressure chronic ulcer of other part of left foot with fat layer exposed 06/20/2022 No Yes L97.325 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 06/20/2022 No Yes evidence of necrosis L97.822 Non-pressure chronic ulcer of other part of left lower leg with fat layer exposed11/09/2021 No Yes Inactive Problems Resolved Problems Electronic Signature(s) Signed: 06/20/2022 4:17:22 PM By: Fredirick Maudlin MD FACS Entered By: Fredirick Maudlin on 06/20/2022  16:17:22 -------------------------------------------------------------------------------- Progress Note Details Patient Name: Date of Service: Edward [name], [name] 06/20/2022 2:30 PM Medical Record Number: [id number] Patient Account Number: [id number] Date of Birth/Sex: Treating RN: [birth year] (51 y.o. [name] Primary Care Provider: [name] Other Clinician: KAVONTE, BEARSE ([id number]) [id number] Page 12 of 19 Referring Provider: Treating Provider/Extender: Kevan Ny in Treatment: 5 Subjective Chief Complaint Information obtained from Patient Patients presents for treatment of an open diabetic ulcer History of Present Illness (HPI) Admission 09/24/2021 Mr. Daelan Gatt is a  51 year old male with a past medical history of foreign years gangrene, type 2 diabetes, end-stage renal disease on hemodialysis, right BKA, left transmetatarsal amputation that presents to the clinic for 3 wounds. He developed cellulitis to the left fourth finger and was treated with doxycycline at urgent care. The nail is coming off. He reports taking the antibiotics currently. He has been keeping the area covered. He also presents with 2 left lower extremity wounds. He reports that the more proximal wound has been present for several months and the more distal wound has developed more recently over the past several weeks. He had an abdominal aortogram to the left lower extremity with laser atherectomy to the left popliteal, left tibioperoneal trunk and left peroneal arteries, drug-coated balloon angioplasty of the left popliteal artery and plain balloon angioplasty of the left tibioperoneal trunk and left peroneal artery. He has not followed up with vein and vascular since his procedure on 04/09/2021. He currently denies signs of infection. He currently denies pain. 11/28/2021: The patient has not been seen since his initial admission. He has not  followed up with vascular surgery, either. He did undergo amputation of his left fourth finger and is currently under the care of orthopedics and occupational therapy for this. He presents today with multiple wounds on his bilateral lower extremities. He is very fluid overloaded, based upon the appearance of his extremities. These all seem to have started as blisters that have subsequently opened and caused ulcers. He does have a shrinker sock for his right BKA, but he is unable to wear it secondary to pain. He also has a compression stocking for his left lower extremity but he has been unable to apply it due to the amputation on his left hand. The largest and most painful of these wounds is just above the knee on the right. All of the wounds have eschar and some slough present. No overt signs of infection. 12/05/2021: All of the wounds look about the same. He has new blisters that have opened up revealing denuded skin underneath. The eschar is extremely hard and where there is not eschar, the wounds are gritty. He was unable to tolerate the Ace wrap at the tightness that we applied it last week and ended up having to have it loosened. He has been tolerating Kerlix and Coban on the left leg. He has not received a follow-up appointment with vascular surgery yet. We have been using Santyl under Hydrofera Blue. 12/12/2021: We have been unable to secure home health care assistance. The patient and his son are doing his wound care at home. He does have a follow-up visit in vascular surgery on May 24. He appears to be less fluid overloaded today. The wound on his right medial thigh looks bigger today and has a thick layer of hard dark eschar. The wound on his lateral right stump looks somewhat improved with softer eschar and slough. There is also slough on the wound overlying the end of the stump and wrapping around to the backside of it. On his left leg, the edema control is improved. The wound on his  anterior tibial surface is small and clean. On his dorsal and medial ankle, there is slough buildup. 12/19/2021: The wound on his left anterior tibial surface is closed. The wound on his dorsal left foot looks about the same with just a bit of slough buildup. The surface is gritty. The wound on his right medial thigh looks about the same today and has reaccumulated the hard  dark eschar, despite vigorous debridement last week. The wound on the right lateral stump is about the same and has less eschar and slough. Overlying the end of the stump and wrapping around the backside of it, the wounds have converged. Overall, his edema control is much better than it has been and certainly than it was on the first visit. 12/26/2021: The wound on his dorsal left foot has a bit of slough accumulation but the surface is less gritty. The wound on his left medial calf is clean with a good bit of granulation tissue. The wounds on his right thigh and stump continue to display a heavy thick layer of black eschar. 01/01/2022: The wound on his dorsal left foot is fairly clean but still has a gritty surface. The wound on his left medial calf is nearly closed with just a little bit of overlying eschar. Unfortunately, the wounds on his right thigh and stump have deteriorated. 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 He has a fluctuant area near the proximal part of his medial thigh wound. There is an odor coming from that leg but I am not sure which of the wounds is producing it or if both of them are. 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 He is now back for further evaluation and management of 3 small wounds on his left lower leg, just above the ankle on the anterior tibial surface. They are very fibrotic and dry. Muscle  fibers are visible in the most proximal wound. There is slough and leathery eschar accumulation present. 06/20/2022: Edward [name] returns to clinic after having been in the hospital for quite some time. He has returned with multiple new wounds. The largest of these is a nearly circumferential wound on the mid to lower portion of his left lower leg. He has wounds on his Achilles tendon area with muscle and tendon exposed. The anterior ankle wound has thick black eschar accumulated on the surface. The dorsal foot wound also has slough and eschar buildup. All of the wound surfaces are incredibly fibrotic and do not look particularly viable. Patient History Information obtained from Patient, Chart. Family History Cancer - Mother, Diabetes - Siblings, Heart Disease - Mother,Father, Kidney Disease - Siblings,Father, No family history of Hereditary Spherocytosis, Hypertension, Lung Disease, Seizures, Stroke, Thyroid Problems, Tuberculosis. Social History Former smoker - Quit 2 years ago, Marital Status - Widowed, Alcohol Use - Rarely, Drug Use - No History, Caffeine Use - Moderate - coffee. Medical History Eyes Patient has history of Cataracts - Surgery Denies history of Glaucoma, Optic Neuritis Hematologic/Lymphatic Patient has history of Anemia Respiratory Patient has history of Sleep Apnea Cardiovascular Patient has history of Arrhythmia - junctional rhythm, Congestive Heart Failure, Coronary Artery Disease, Hypertension, Myocardial Infarction, Peripheral Venous Disease Endocrine Patient has history of Type II Diabetes Denies history of Type I Diabetes Genitourinary Patient has history of End Stage Renal Disease - Dialysis JAMIEON, LANNEN ([id number]) [id number] Page 13 of 19 Integumentary (Skin) Denies history of History of Burn Musculoskeletal Patient has history of Osteoarthritis, Osteomyelitis Denies history of Gout, Rheumatoid Arthritis Neurologic Patient  has history of Neuropathy Oncologic Denies history of Received Chemotherapy, Received Radiation Psychiatric Patient has history of Confinement Anxiety Denies history of Anorexia/bulimia Hospitalization/Surgery History - amputation left index finger 11/15/21. - debridement and closure  left ring finger. - peripheral vascular atherectomy 04/06/55. - left bascillic vein transposition 12/1 21. - revision of AV fistula left 05/24/20. - AV fistula placement left 11/25/19. - coronary stent intervention 12/11 19. - right BKA 06/19/18. - total left hip arthroplasty 10/11/16. - left transmet amp 05/10/16. - amputation above knee R 01/08/22. - amputauoin left index finger 01/12/22. - amputation right index finger. Medical A Surgical History Notes nd Cardiovascular cardiomyopathy Neurologic TIA Objective Constitutional No acute distress.. Vitals Time Taken: 3:00 PM, Temperature: 98.5 F, Pulse: 80 bpm, Respiratory Rate: 20 breaths/min, Blood Pressure: 117/67 mmHg. Respiratory Normal work of breathing on room air.. General Notes: 06/20/2022: He has multiple new wounds. The largest of these is a nearly circumferential wound on the mid to lower portion of his left lower leg. He has wounds on his Achilles tendon area with muscle and tendon exposed. The anterior ankle wound has thick black eschar accumulated on the surface. The dorsal foot wound also has slough and eschar buildup. All of the wound surfaces are incredibly fibrotic and do not look particularly viable. Integumentary (Hair, Skin) Wound #11 status is Open. Original cause of wound was Gradually Appeared. The date acquired was: 12/17/2021. The wound has been in treatment 5 weeks. The wound is located on the Left,Anterior Lower Leg. The wound measures 5.6cm length x 8cm width x 0.1cm depth; 35.186cm^2 area and 3.519cm^3 volume. There is muscle and Fat Layer (Subcutaneous Tissue) exposed. There is no tunneling or undermining noted. There is a medium amount of  serosanguineous drainage noted. There is small (1-33%) pink granulation within the wound bed. There is a large (67-100%) amount of necrotic tissue within the wound bed including Eschar and Adherent Slough. Wound #12 status is Open. Original cause of wound was Gradually Appeared. The date acquired was: 06/15/2022. The wound is located on the Left Achilles. The wound measures 3cm length x 2.2cm width x 0.1cm depth; 5.184cm^2 area and 0.518cm^3 volume. There is Fat Layer (Subcutaneous Tissue) exposed. There is no tunneling or undermining noted. There is a medium amount of serosanguineous drainage noted. There is small (1-33%) pink granulation within the wound bed. There is a large (67-100%) amount of necrotic tissue within the wound bed including Eschar and Adherent Slough. Periwound temperature was noted as No Abnormality. Wound #13 status is Open. Original cause of wound was Gradually Appeared. The date acquired was: 06/18/2022. The wound is located on the Left,Circumferential Lower Leg. The wound measures 18.5cm length x 16cm width x 0.1cm depth; 232.478cm^2 area and 23.248cm^3 volume. There is Fat Layer (Subcutaneous Tissue) exposed. There is no tunneling or undermining noted. There is a medium amount of serosanguineous drainage noted. There is medium (34-66%) pink granulation within the wound bed. There is a medium (34-66%) amount of necrotic tissue within the wound bed including Eschar and Adherent Slough. Wound #14 status is Open. Original cause of wound was Gradually Appeared. The date acquired was: 06/18/2022. The wound is located on the Left,Dorsal Foot. The wound measures 1.1cm length x 1.2cm width x 0.2cm depth; 1.037cm^2 area and 0.207cm^3 volume. There is Fat Layer (Subcutaneous Tissue) exposed. There is no tunneling or undermining noted. There is a medium amount of serosanguineous drainage noted. There is small (1-33%) pink granulation within the wound bed. There is a large (67-100%)  amount of necrotic tissue within the wound bed including Eschar and Adherent Slough. Wound #15 status is Open. Original cause of wound was Gradually Appeared. The date acquired was: 06/18/2022. The wound is located on the  Left,Proximal,Anterior Lower Leg. The wound measures 1.5cm length x 2cm width x 0.1cm depth; 2.356cm^2 area and 0.236cm^3 volume. There is Fat Layer (Subcutaneous Tissue) exposed. There is no tunneling or undermining noted. There is a medium amount of serosanguineous drainage noted. There is large (67- 100%) pink granulation within the wound bed. There is a small (1-33%) amount of necrotic tissue within the wound bed including Eschar and Adherent Slough. Wound #16 status is Open. Original cause of wound was Gradually Appeared. The date acquired was: 06/18/2022. The wound is located on the Left,Distal,Medial Lower Leg. The wound measures 3cm length x 1cm width x 0.1cm depth; 2.356cm^2 area and 0.236cm^3 volume. There is Fat Layer (Subcutaneous Tissue) exposed. There is no tunneling or undermining noted. There is a medium amount of serosanguineous drainage noted. There is small (1- 33%) pink granulation within the wound bed. There is a large (67-100%) amount of necrotic tissue within the wound bed including Eschar and Adherent Slough. 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 without evidence of necrosis End stage renal disease Acquired absence of right leg above knee Acquired absence of left foot Chronic combined systolic (congestive) and diastolic (congestive) heart failure Peripheral vascular disease, unspecified Type 2 diabetes mellitus with diabetic peripheral angiopathy with gangrene Non-pressure chronic ulcer of other part of left foot with fat layer exposed Non-pressure chronic ulcer of left ankle with muscle  involvement without evidence of necrosis Non-pressure chronic ulcer of other part of left lower leg with fat layer exposed Procedures Wound #11 Pre-procedure diagnosis of Wound #11 is a Diabetic Wound/Ulcer of the Lower Extremity located on the Left,Anterior Lower Leg .Severity of Tissue Pre Debridement is: Fat layer exposed. There was a Excisional Skin/Subcutaneous Tissue Debridement with a total area of 44.8 sq cm performed by Fredirick Maudlin, MD. With the following instrument(s): Curette to remove Non-Viable tissue/material. Material removed includes Eschar, Subcutaneous Tissue, and Slough after achieving pain control using Lidocaine 5% topical ointment. No specimens were taken. A time out was conducted at 15:50, prior to the start of the procedure. A Minimum amount of bleeding was controlled with Pressure. The procedure was tolerated well with a pain level of 0 throughout and a pain level of 0 following the procedure. Post Debridement Measurements: 5.6cm length x 8cm width x 0.1cm depth; 3.519cm^3 volume. Character of Wound/Ulcer Post Debridement is improved. Severity of Tissue Post Debridement is: Fat layer exposed. Post procedure Diagnosis Wound #11: Same as Pre-Procedure General Notes: Scribed for Dr. Celine Ahr by Lenna Sciara.S.. Wound #12 Pre-procedure diagnosis of Wound #12 is a Diabetic Wound/Ulcer of the Lower Extremity located on the Left Achilles .Severity of Tissue Pre Debridement is: Fat layer exposed. There was a Excisional Skin/Subcutaneous Tissue Debridement with a total area of 6.6 sq cm performed by Fredirick Maudlin, MD. With the following instrument(s): Curette to remove Non-Viable tissue/material. Material removed includes Eschar, Subcutaneous Tissue, and Slough after achieving pain control using Lidocaine 5% topical ointment. No specimens were taken. A time out was conducted at 15:50, prior to the start of the procedure. A Minimum amount of bleeding was controlled with Pressure. The  procedure was tolerated well with a pain level of 0 throughout and a pain level of 0 following the procedure. Post Debridement Measurements: 3cm length x 2.2cm width x 0.1cm depth; 0.518cm^3 volume. Character of Wound/Ulcer Post Debridement is improved. Severity of Tissue Post Debridement is: Fat layer exposed. Post procedure  Diagnosis Wound #12: Same as Pre-Procedure General Notes: Scribed for Dr. Celine Ahr by J.Scotton. Wound #13 Pre-procedure diagnosis of Wound #13 is a Diabetic Wound/Ulcer of the Lower Extremity located on the Left,Circumferential Lower Leg .Severity of Tissue Pre Debridement is: Fat layer exposed. There was a Excisional Skin/Subcutaneous Tissue Debridement with a total area of 296 sq cm performed by Fredirick Maudlin, MD. With the following instrument(s): Curette to remove Non-Viable tissue/material. Material removed includes Eschar, Subcutaneous Tissue, and Slough after achieving pain control using Lidocaine 5% topical ointment. No specimens were taken. A time out was conducted at 15:50, prior to the start of the procedure. A Minimum amount of bleeding was controlled with Pressure. The procedure was tolerated well with a pain level of 0 throughout and a pain level of 0 following the procedure. Post Debridement Measurements: 18.5cm length x 16cm width x 0.1cm depth; 23.248cm^3 volume. Character of Wound/Ulcer Post Debridement is improved. Severity of Tissue Post Debridement is: Fat layer exposed. Post procedure Diagnosis Wound #13: Same as Pre-Procedure General Notes: Scribed for Dr. Celine Ahr by Lenna Sciara.S.. Wound #14 Pre-procedure diagnosis of Wound #14 is a Diabetic Wound/Ulcer of the Lower Extremity located on the Left,Dorsal Foot .Severity of Tissue Pre Debridement is: Fat layer exposed. There was a Excisional Skin/Subcutaneous Tissue Debridement with a total area of 1.32 sq cm performed by Fredirick Maudlin, MD. With the following instrument(s): Curette to remove Non-Viable  tissue/material. Material removed includes Eschar, Subcutaneous Tissue, and Slough after achieving pain control using Lidocaine 5% topical ointment. No specimens were taken. A time out was conducted at 15:50, prior to the start of the procedure. A Minimum amount of bleeding was controlled with Pressure. The procedure was tolerated well with a pain level of 0 throughout and a pain level of 0 following the procedure. Post Debridement Measurements: 1.1cm length x 1.2cm width x 0.2cm depth; 0.207cm^3 volume. Character of Wound/Ulcer Post Debridement is improved. Severity of Tissue Post Debridement is: Fat layer exposed. Post procedure Diagnosis Wound #14: Same as Pre-Procedure General Notes: Scribed for Dr. Celine Ahr by J.Scotton. Wound #15 Pre-procedure diagnosis of Wound #15 is a Diabetic Wound/Ulcer of the Lower Extremity located on the Left,Proximal,Anterior Lower Leg .Severity of Tissue Pre Debridement is: Fat layer exposed. There was a Excisional Skin/Subcutaneous Tissue Debridement with a total area of 3 sq cm performed by Fredirick Maudlin, MD. With the following instrument(s): Curette to remove Non-Viable tissue/material. Material removed includes Eschar, Subcutaneous Tissue, and Slough after achieving pain control using Lidocaine 5% topical ointment. No specimens were taken. A time out was conducted at 15:50, prior to the start of the procedure. A Minimum amount of bleeding was controlled with Pressure. The procedure was tolerated well with a pain level of 0 throughout and a pain level of 0 following the procedure. Post Debridement Measurements: 1.5cm length x 2cm width x 0.1cm depth; 0.236cm^3 volume. Character of Wound/Ulcer Post Debridement is improved. Severity of Tissue Post Debridement is: Fat layer exposed. Post procedure Diagnosis Wound #15: Same as Pre-Procedure General Notes: Scribed for Dr. Celine Ahr by J.Scotton. Wound #16 Pre-procedure diagnosis of Wound #16 is a Diabetic Wound/Ulcer of  the Lower Extremity located on the Left,Distal,Medial Lower Leg .Severity of Tissue Pre Debridement is: Fat layer exposed. There was a Excisional Skin/Subcutaneous Tissue Debridement with a total area of 3 sq cm performed by Fredirick Maudlin, MD. With the following instrument(s): Curette to remove Non-Viable tissue/material. Material removed includes Eschar, Subcutaneous Tissue, and Slough after achieving pain control using Lidocaine 5% topical ointment. 1 specimen  was taken by a Tissue Culture and sent to the lab per facility protocol. A time out was conducted at 15:50, prior to the start of the procedure. A Minimum amount of bleeding was controlled with Pressure. The procedure was tolerated well with a pain level of 0 throughout and a pain level of 0 following the procedure. Post Debridement Measurements: 3cm length x 1cm width x 0.1cm depth; 0.236cm^3 volume. Character of Wound/Ulcer Post Debridement is improved. Severity of Tissue Post Debridement is: Fat layer exposed. Post procedure Diagnosis Wound #16: Same as Pre-Procedure General Notes: Scribed for Dr. Celine Ahr by Lenna Sciara.[address] [place], [place] ([id number]) [id number] Page 15 of 19 Follow-up Appointments: Return Appointment in 1 week. - Dr. Celine Ahr Rm 3 Anesthetic: Wound #11 Left,Anterior Lower Leg: (In clinic) Topical Lidocaine 5% applied to wound bed Bathing/ Shower/ Hygiene: May shower and wash wound with soap and water. - use antibacterial soap to wash legs and wounds when changing dressing. right leg Edema Control - Lymphedema / SCD / Other: Elevate legs to the level of the heart or above for 30 minutes daily and/or when sitting, a frequency of: - throughout the day Avoid standing for long periods of time. Additional Orders / Instructions: Follow Nutritious Diet - Monitor/Control Blood Sugars Laboratory ordered were: Anaerobic culture - Left Medial ankle of Lower leg WOUND #11: - Lower Leg Wound  Laterality: Left, Anterior Cleanser: Soap and Water Every Other Day/30 Days Discharge Instructions: May shower and wash wound with dial antibacterial soap and water prior to dressing change. Peri-Wound Care: Sween Lotion (Moisturizing lotion) Every Other Day/30 Days Discharge Instructions: Apply moisturizing lotion as directed Prim Dressing: IODOFLEX 0.9% Cadexomer Iodine Pad 4x6 cm (DME) (Generic) Every Other Day/30 Days ary Discharge Instructions: Use Iodosorb if Iodoflex not available Secondary Dressing: ABD Pad, 5x9 (DME) (Generic) Every Other Day/30 Days Discharge Instructions: Apply over primary dressing as directed. Secondary Dressing: Woven Gauze Sponge, Non-Sterile 4x4 in (DME) (Generic) Every Other Day/30 Days Discharge Instructions: Apply over primary dressing as directed. Secured With: Elastic Bandage 4 inch (ACE bandage) (DME) (Generic) Every Other Day/30 Days Discharge Instructions: Secure with ACE bandage as directed. Secured With: [employer], 4.5x3.1 (in/yd) (DME) (Generic) Every Other Day/30 Days Discharge Instructions: Secure with Kerlix as directed. Secured With: 42M Medipore H Soft Cloth Surgical T ape, 4 x 10 (in/yd) (DME) (Generic) Every Other Day/30 Days Discharge Instructions: Secure with tape as directed. WOUND #12: - Achilles Wound Laterality: Left Cleanser: Soap and Water Every Other Day/30 Days Discharge Instructions: May shower and wash wound with dial antibacterial soap and water prior to dressing change. Peri-Wound Care: Sween Lotion (Moisturizing lotion) Every Other Day/30 Days Discharge Instructions: Apply moisturizing lotion as directed Prim Dressing: IODOFLEX 0.9% Cadexomer Iodine Pad 4x6 cm (DME) (Generic) Every Other Day/30 Days ary Discharge Instructions: Use Iodosorb if Iodoflex not available Secondary Dressing: ABD Pad, 5x9 (DME) (Generic) Every Other Day/30 Days Discharge Instructions: Apply over primary dressing as directed. Secondary  Dressing: Woven Gauze Sponge, Non-Sterile 4x4 in (DME) (Generic) Every Other Day/30 Days Discharge Instructions: Apply over primary dressing as directed. Secured With: Elastic Bandage 4 inch (ACE bandage) (DME) (Generic) Every Other Day/30 Days Discharge Instructions: Secure with ACE bandage as directed. Secured With: [employer], 4.5x3.1 (in/yd) (DME) (Generic) Every Other Day/30 Days Discharge Instructions: Secure with Kerlix as directed. Secured With: 42M Medipore H Soft Cloth Surgical T ape, 4 x 10 (in/yd) (DME) (Generic) Every Other Day/30 Days Discharge Instructions: Secure with tape as directed. WOUND #13: -  Lower Leg Wound Laterality: Left, Circumferential Cleanser: Soap and Water Every Other Day/30 Days Discharge Instructions: May shower and wash wound with dial antibacterial soap and water prior to dressing change. Peri-Wound Care: Sween Lotion (Moisturizing lotion) Every Other Day/30 Days Discharge Instructions: Apply moisturizing lotion as directed Prim Dressing: IODOFLEX 0.9% Cadexomer Iodine Pad 4x6 cm (DME) (Generic) Every Other Day/30 Days ary Discharge Instructions: Use Iodosorb if Iodoflex not available Secondary Dressing: ABD Pad, 5x9 (DME) (Generic) Every Other Day/30 Days Discharge Instructions: Apply over primary dressing as directed. Secondary Dressing: Woven Gauze Sponge, Non-Sterile 4x4 in (DME) (Generic) Every Other Day/30 Days Discharge Instructions: Apply over primary dressing as directed. Secured With: Elastic Bandage 4 inch (ACE bandage) (DME) (Generic) Every Other Day/30 Days Discharge Instructions: Secure with ACE bandage as directed. Secured With: [employer], 4.5x3.1 (in/yd) (DME) (Generic) Every Other Day/30 Days Discharge Instructions: Secure with Kerlix as directed. Secured With: 22M Medipore H Soft Cloth Surgical T ape, 4 x 10 (in/yd) (DME) (Generic) Every Other Day/30 Days Discharge Instructions: Secure with tape as directed. WOUND #14: -  Foot Wound Laterality: Dorsal, Left Cleanser: Soap and Water Every Other Day/30 Days Discharge Instructions: May shower and wash wound with dial antibacterial soap and water prior to dressing change. Peri-Wound Care: Sween Lotion (Moisturizing lotion) Every Other Day/30 Days Discharge Instructions: Apply moisturizing lotion as directed Prim Dressing: IODOFLEX 0.9% Cadexomer Iodine Pad 4x6 cm (DME) (Generic) Every Other Day/30 Days ary Discharge Instructions: Use Iodosorb if Iodoflex not available Secondary Dressing: ABD Pad, 5x9 (DME) (Generic) Every Other Day/30 Days Discharge Instructions: Apply over primary dressing as directed. Secondary Dressing: Woven Gauze Sponge, Non-Sterile 4x4 in (DME) (Generic) Every Other Day/30 Days Discharge Instructions: Apply over primary dressing as directed. Secured With: Elastic Bandage 4 inch (ACE bandage) (DME) (Generic) Every Other Day/30 Days Discharge Instructions: Secure with ACE bandage as directed. Secured With: [employer], 4.5x3.1 (in/yd) (DME) (Generic) Every Other Day/30 Days Discharge Instructions: Secure with Kerlix as directed. Secured With: 22M Medipore H Soft Cloth Surgical T ape, 4 x 10 (in/yd) (DME) (Generic) Every Other Day/30 Days Discharge Instructions: Secure with tape as directed. WOUND #15: - Lower Leg Wound Laterality: Left, Anterior, Proximal Cleanser: Soap and Water Every Other Day/30 Days Discharge Instructions: May shower and wash wound with dial antibacterial soap and water prior to dressing change. Peri-Wound Care: Sween Lotion (Moisturizing lotion) Every Other Day/30 Days Discharge Instructions: Apply moisturizing lotion as directed Prim Dressing: IODOFLEX 0.9% Cadexomer Iodine Pad 4x6 cm (DME) (Generic) Every Other Day/30 Days ary Discharge Instructions: Use Iodosorb if Iodoflex not available Secondary Dressing: ABD Pad, 5x9 (DME) (Generic) Every Other Day/30 Days DOMANIQUE, LUCKETT ([id number])  [id number] Page 16 of 19 Discharge Instructions: Apply over primary dressing as directed. Secondary Dressing: Woven Gauze Sponge, Non-Sterile 4x4 in (DME) (Generic) Every Other Day/30 Days Discharge Instructions: Apply over primary dressing as directed. Secured With: Elastic Bandage 4 inch (ACE bandage) (DME) (Generic) Every Other Day/30 Days Discharge Instructions: Secure with ACE bandage as directed. Secured With: [employer], 4.5x3.1 (in/yd) (DME) (Generic) Every Other Day/30 Days Discharge Instructions: Secure with Kerlix as directed. Secured With: 22M Medipore H Soft Cloth Surgical T ape, 4 x 10 (in/yd) (DME) (Generic) Every Other Day/30 Days Discharge Instructions: Secure with tape as directed. WOUND #16: - Lower Leg Wound Laterality: Left, Medial, Distal Cleanser: Soap and Water Every Other Day/30 Days Discharge Instructions: May shower and wash wound with dial antibacterial soap and water prior to dressing change. Peri-Wound Care:  Sween Lotion (Moisturizing lotion) Every Other Day/30 Days Discharge Instructions: Apply moisturizing lotion as directed Prim Dressing: IODOFLEX 0.9% Cadexomer Iodine Pad 4x6 cm (DME) (Generic) Every Other Day/30 Days ary Discharge Instructions: Use Iodosorb if Iodoflex not available Secondary Dressing: ABD Pad, 5x9 (DME) (Generic) Every Other Day/30 Days Discharge Instructions: Apply over primary dressing as directed. Secondary Dressing: Woven Gauze Sponge, Non-Sterile 4x4 in (DME) (Generic) Every Other Day/30 Days Discharge Instructions: Apply over primary dressing as directed. Secured With: Elastic Bandage 4 inch (ACE bandage) (DME) (Generic) Every Other Day/30 Days Discharge Instructions: Secure with ACE bandage as directed. Secured With: [employer], 4.5x3.1 (in/yd) (DME) (Generic) Every Other Day/30 Days Discharge Instructions: Secure with Kerlix as directed. Secured With: 50M Medipore H Soft Cloth Surgical  T ape, 4 x 10 (in/yd) (DME) (Generic) Every Other Day/30 Days Discharge Instructions: Secure with tape as directed. 06/20/2022: He has multiple new wounds. The largest of these is a nearly circumferential wound on the mid to lower portion of his left lower leg. He has wounds on his Achilles tendon area with muscle and tendon exposed. The anterior ankle wound has thick black eschar accumulated on the surface. The dorsal foot wound also has slough and eschar buildup. All of the wound surfaces are incredibly fibrotic and do not look particularly viable. I performed an extensive debridement, removing eschar, nonviable subcutaneous tissue, and slough from all of his wound surfaces. In the process of debriding a small wound on his foot, pus was encountered. This was taken for culture. Once the results return, I will prescribe appropriate antibiotic therapy if indicated. Given the very pale and fibrotic nature of these wounds, I am quite concerned that his vasculopathy is going to adversely affect his wound healing. Based on the aortogram done just over a year ago, he has pretty extensive disease in the leg and Dr. Claretha Cooper note suggests that he does not really have much in the way of revascularization possibilities; we may need to send him to vascular surgery and repeat the aortogram to see if any of the areas that were treated a year ago have reoccluded. Given the large surface area, Santyl does not seem to be an economically viable topical agent for ongoing chemical or enzymatic debridement, so we will use Iodoflex. Kerlix and Ace bandage. Follow-up in 1 week. Electronic Signature(s) Signed: 06/20/2022 5:36:36 PM By: Dellie Catholic RN Signed: 06/21/2022 7:31:02 AM By: Fredirick Maudlin MD FACS Previous Signature: 06/20/2022 4:34:36 PM Version By: Fredirick Maudlin MD FACS Previous Signature: 06/20/2022 4:33:07 PM Version By: Fredirick Maudlin MD FACS Entered By: Dellie Catholic on 06/20/2022  17:21:12 -------------------------------------------------------------------------------- HxROS Details Patient Name: Date of Service: Edward [name], [name] 06/20/2022 2:30 PM Medical Record Number: [id number] Patient Account Number: [id number] Date of Birth/Sex: Treating RN: February 26, 1971 (51 y.o. [name] Primary Care Provider: [name] Other Clinician: Referring Provider: Treating Provider/Extender: Kevan Ny in Treatment: 5 Information Obtained From Patient Chart Eyes Medical History: Positive for: Cataracts - Surgery Negative for: Glaucoma; Optic Neuritis Hematologic/Lymphatic Medical History: Positive for: Anemia STARR, ENGEL ([id number]) [id number] Page 17 of 19 Respiratory Medical History: Positive for: Sleep Apnea Cardiovascular Medical History: Positive for: Arrhythmia - junctional rhythm; Congestive Heart Failure; Coronary Artery Disease; Hypertension; Myocardial Infarction; Peripheral Venous Disease Past Medical History Notes: cardiomyopathy Endocrine Medical History: Positive for: Type II Diabetes Negative for: Type I Diabetes Time with diabetes: 2004 Treated with: Insulin Blood sugar tested every day: Yes Tested : daily Genitourinary Medical  History: Positive for: End Stage Renal Disease - Dialysis Integumentary (Skin) Medical History: Negative for: History of Burn Musculoskeletal Medical History: Positive for: Osteoarthritis; Osteomyelitis Negative for: Gout; Rheumatoid Arthritis Neurologic Medical History: Positive for: Neuropathy Past Medical History Notes: TIA Oncologic Medical History: Negative for: Received Chemotherapy; Received Radiation Psychiatric Medical History: Positive for: Confinement Anxiety Negative for: Anorexia/bulimia HBO Extended History Items Eyes: Cataracts Immunizations Pneumococcal Vaccine: Received Pneumococcal Vaccination: Yes Received  Pneumococcal Vaccination On or After 60th Birthday: No Implantable Devices Yes Hospitalization / Surgery History Type of Hospitalization/Surgery amputation left index finger 11/15/21 debridement and closure left ring finger peripheral vascular atherectomy 3/47/42 left bascillic vein transposition 12/1 21 revision of AV fistula left 05/24/20 ALBI, RAPPAPORT ([id number]) [id number] Page 18 of 19 AV fistula placement left 11/25/19 coronary stent intervention 12/11 19 right 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/18 total left hip arthroplasty 10/11/16 left transmet amp 05/10/16 amputation above knee R 01/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 amputauoin left index finger 01/12/22 amputation right index finger Family and Social History Cancer: Yes - Mother; Diabetes: Yes - Siblings; Heart Disease: Yes - Mother,Father; Hereditary Spherocytosis: No; Hypertension: No; Kidney Disease: Yes - Siblings,Father; Lung Disease: No; Seizures: No; Stroke: No; Thyroid Problems: No; Tuberculosis: No; Former smoker - Quit 2 years ago; Marital Status - Widowed; Alcohol Use: Rarely; Drug Use: No History; Caffeine Use: Moderate - coffee; Financial Concerns: No; Food, Clothing or Shelter Needs: No; Support System Lacking: No; Transportation Concerns: No Electronic Signature(s) Signed: 06/20/2022 4:34:52 PM By: Fredirick Maudlin MD FACS Signed: 06/20/2022 5:05:53 PM By: Dellie Catholic RN Entered By: Fredirick Maudlin on 06/20/2022 16:22:34 -------------------------------------------------------------------------------- SuperBill Details Patient Name: Date of Service: Edward [name], [place] MES L. 06/20/2022 Medical Record Number: [id number] Patient Account Number: [id number] Date of Birth/Sex: Treating RN: 10-10-1970 (51 y.o. 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 without evidence of necrosis N18.6 End stage renal disease Z89.611 Acquired absence of right leg above knee Z89.432 Acquired absence of left foot I50.42 Chronic combined systolic (congestive) and diastolic (congestive) heart failure I73.9 Peripheral vascular disease, unspecified E11.52 Type 2 diabetes mellitus with diabetic peripheral angiopathy with gangrene L97.522 Non-pressure chronic ulcer of other part of left foot with fat layer exposed L97.325 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 L97.822 Non-pressure chronic ulcer of other part of left lower leg with fat layer exposed Facility Procedures : CPT4 Code: 30160109 Description: 11042 - DEB SUBQ TISSUE 20 SQ CM/< ICD-10 Diagnosis Description L97.825 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 involveme L97.522 Non-pressure chronic ulcer of other part of left foot with fat layer exposed  L97.325 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 evidenc L97.822 Non-pressure chronic ulcer of other part of left lower leg with fat layer expose Modifier: nt without evidence e of necrosis d Quantity: 1 of necrosis : CPT4 Code: 32355732 Description: 11045 - DEB SUBQ TISS EA ADDL 20CM ICD-10 Diagnosis Description L97.825 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 involveme L97.522 Non-pressure chronic ulcer of other part of left foot with fat layer exposed  L97.325 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 evidenc L97.822 Non-pressure chronic ulcer of other part of left lower leg with fat layer expose Modifier: nt without evidence e of  necrosis d Quantity: 17 of necrosis Physician Procedures : CPT4 Code Description Modifier CARTRELL, BENTSEN ([id number]) [id number] -  WC PHYS LEVEL 4 - EST PT 25 1 ICD-10 Diagnosis Description L97.825 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  involvement without evidence of nec L97.522 Non-pressure chronic ulcer of other part of left foot with fat layer exposed L97.325 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 L97.822 Non-pressure chronic  ulcer of other part of left lower leg with fat layer exposed Quantity: Page 19 of 19 rosis : [id number] 11042 - WC PHYS SUBQ TISS 20 SQ CM 1 ICD-10 Diagnosis Description L97.825 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 without evidence of nec L97.522 Non-pressure chronic ulcer of other part of left foot  with fat layer exposed L97.325 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 L97.822 Non-pressure chronic ulcer of other part of left lower leg with fat layer exposed Quantity: rosis : [id number] 11045 - WC PHYS SUBQ TISS EA ADDL 20 CM 17 ICD-10 Diagnosis Description L97.825 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 without evidence of nec L97.522 Non-pressure chronic ulcer of other part of left  foot with fat layer exposed L97.325 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

## 2022-06-22 DIAGNOSIS — N2581 Secondary hyperparathyroidism of renal origin: Secondary | ICD-10-CM | POA: Diagnosis not present

## 2022-06-22 DIAGNOSIS — D689 Coagulation defect, unspecified: Secondary | ICD-10-CM | POA: Diagnosis not present

## 2022-06-22 DIAGNOSIS — R52 Pain, unspecified: Secondary | ICD-10-CM | POA: Diagnosis not present

## 2022-06-22 DIAGNOSIS — E1129 Type 2 diabetes mellitus with other diabetic kidney complication: Secondary | ICD-10-CM | POA: Diagnosis not present

## 2022-06-22 DIAGNOSIS — Z992 Dependence on renal dialysis: Secondary | ICD-10-CM | POA: Diagnosis not present

## 2022-06-22 DIAGNOSIS — L299 Pruritus, unspecified: Secondary | ICD-10-CM | POA: Diagnosis not present

## 2022-06-22 DIAGNOSIS — N186 End stage renal disease: Secondary | ICD-10-CM | POA: Diagnosis not present

## 2022-06-22 DIAGNOSIS — D631 Anemia in chronic kidney disease: Secondary | ICD-10-CM | POA: Diagnosis not present

## 2022-06-22 DIAGNOSIS — T8249XD Other complication of vascular dialysis catheter, subsequent encounter: Secondary | ICD-10-CM | POA: Diagnosis not present

## 2022-06-24 DIAGNOSIS — M79645 Pain in left finger(s): Secondary | ICD-10-CM | POA: Diagnosis not present

## 2022-06-24 DIAGNOSIS — I998 Other disorder of circulatory system: Secondary | ICD-10-CM | POA: Diagnosis not present

## 2022-06-24 DIAGNOSIS — S81802A Unspecified open wound, left lower leg, initial encounter: Secondary | ICD-10-CM | POA: Diagnosis not present

## 2022-06-24 DIAGNOSIS — Z992 Dependence on renal dialysis: Secondary | ICD-10-CM | POA: Diagnosis not present

## 2022-06-24 DIAGNOSIS — I110 Hypertensive heart disease with heart failure: Secondary | ICD-10-CM | POA: Diagnosis not present

## 2022-06-24 DIAGNOSIS — D631 Anemia in chronic kidney disease: Secondary | ICD-10-CM | POA: Diagnosis not present

## 2022-06-24 DIAGNOSIS — S81001A Unspecified open wound, right knee, initial encounter: Secondary | ICD-10-CM | POA: Diagnosis not present

## 2022-06-24 DIAGNOSIS — N186 End stage renal disease: Secondary | ICD-10-CM | POA: Diagnosis not present

## 2022-06-24 DIAGNOSIS — S68119D Complete traumatic metacarpophalangeal amputation of unspecified finger, subsequent encounter: Secondary | ICD-10-CM | POA: Diagnosis not present

## 2022-06-24 DIAGNOSIS — G546 Phantom limb syndrome with pain: Secondary | ICD-10-CM | POA: Diagnosis not present

## 2022-06-24 DIAGNOSIS — M79644 Pain in right finger(s): Secondary | ICD-10-CM | POA: Diagnosis not present

## 2022-06-24 DIAGNOSIS — I739 Peripheral vascular disease, unspecified: Secondary | ICD-10-CM | POA: Diagnosis not present

## 2022-06-24 DIAGNOSIS — M25641 Stiffness of right hand, not elsewhere classified: Secondary | ICD-10-CM | POA: Diagnosis not present

## 2022-06-24 DIAGNOSIS — I96 Gangrene, not elsewhere classified: Secondary | ICD-10-CM | POA: Diagnosis not present

## 2022-06-24 DIAGNOSIS — E1122 Type 2 diabetes mellitus with diabetic chronic kidney disease: Secondary | ICD-10-CM | POA: Diagnosis not present

## 2022-06-24 DIAGNOSIS — L02519 Cutaneous abscess of unspecified hand: Secondary | ICD-10-CM | POA: Diagnosis not present

## 2022-06-24 DIAGNOSIS — T148XXA Other injury of unspecified body region, initial encounter: Secondary | ICD-10-CM | POA: Diagnosis not present

## 2022-06-24 DIAGNOSIS — I5042 Chronic combined systolic (congestive) and diastolic (congestive) heart failure: Secondary | ICD-10-CM | POA: Diagnosis not present

## 2022-06-25 DIAGNOSIS — T8249XD Other complication of vascular dialysis catheter, subsequent encounter: Secondary | ICD-10-CM | POA: Diagnosis not present

## 2022-06-25 DIAGNOSIS — L299 Pruritus, unspecified: Secondary | ICD-10-CM | POA: Diagnosis not present

## 2022-06-25 DIAGNOSIS — N186 End stage renal disease: Secondary | ICD-10-CM | POA: Diagnosis not present

## 2022-06-25 DIAGNOSIS — E1129 Type 2 diabetes mellitus with other diabetic kidney complication: Secondary | ICD-10-CM | POA: Diagnosis not present

## 2022-06-25 DIAGNOSIS — D689 Coagulation defect, unspecified: Secondary | ICD-10-CM | POA: Diagnosis not present

## 2022-06-25 DIAGNOSIS — Z992 Dependence on renal dialysis: Secondary | ICD-10-CM | POA: Diagnosis not present

## 2022-06-25 DIAGNOSIS — D631 Anemia in chronic kidney disease: Secondary | ICD-10-CM | POA: Diagnosis not present

## 2022-06-25 DIAGNOSIS — R52 Pain, unspecified: Secondary | ICD-10-CM | POA: Diagnosis not present

## 2022-06-25 DIAGNOSIS — N2581 Secondary hyperparathyroidism of renal origin: Secondary | ICD-10-CM | POA: Diagnosis not present

## 2022-06-27 ENCOUNTER — Encounter (HOSPITAL_BASED_OUTPATIENT_CLINIC_OR_DEPARTMENT_OTHER): Payer: Medicare Other | Admitting: General Surgery

## 2022-06-27 DIAGNOSIS — I132 Hypertensive heart and chronic kidney disease with heart failure and with stage 5 chronic kidney disease, or end stage renal disease: Secondary | ICD-10-CM | POA: Diagnosis not present

## 2022-06-27 DIAGNOSIS — B965 Pseudomonas (aeruginosa) (mallei) (pseudomallei) as the cause of diseases classified elsewhere: Secondary | ICD-10-CM | POA: Diagnosis not present

## 2022-06-27 DIAGNOSIS — T8249XD Other complication of vascular dialysis catheter, subsequent encounter: Secondary | ICD-10-CM | POA: Diagnosis not present

## 2022-06-27 DIAGNOSIS — L97825 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 without evidence of necrosis: Secondary | ICD-10-CM | POA: Diagnosis not present

## 2022-06-27 DIAGNOSIS — Z992 Dependence on renal dialysis: Secondary | ICD-10-CM | POA: Diagnosis not present

## 2022-06-27 DIAGNOSIS — L299 Pruritus, unspecified: Secondary | ICD-10-CM | POA: Diagnosis not present

## 2022-06-27 DIAGNOSIS — L97325 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: Secondary | ICD-10-CM | POA: Diagnosis not present

## 2022-06-27 DIAGNOSIS — E1152 Type 2 diabetes mellitus with diabetic peripheral angiopathy with gangrene: Secondary | ICD-10-CM | POA: Diagnosis not present

## 2022-06-27 DIAGNOSIS — N2581 Secondary hyperparathyroidism of renal origin: Secondary | ICD-10-CM | POA: Diagnosis not present

## 2022-06-27 DIAGNOSIS — E11622 Type 2 diabetes mellitus with other skin ulcer: Secondary | ICD-10-CM | POA: Diagnosis not present

## 2022-06-27 DIAGNOSIS — D689 Coagulation defect, unspecified: Secondary | ICD-10-CM | POA: Diagnosis not present

## 2022-06-27 DIAGNOSIS — N186 End stage renal disease: Secondary | ICD-10-CM | POA: Diagnosis not present

## 2022-06-27 DIAGNOSIS — D631 Anemia in chronic kidney disease: Secondary | ICD-10-CM | POA: Diagnosis not present

## 2022-06-27 DIAGNOSIS — E1122 Type 2 diabetes mellitus with diabetic chronic kidney disease: Secondary | ICD-10-CM | POA: Diagnosis not present

## 2022-06-27 DIAGNOSIS — Z89432 Acquired absence of left foot: Secondary | ICD-10-CM | POA: Diagnosis not present

## 2022-06-27 DIAGNOSIS — R52 Pain, unspecified: Secondary | ICD-10-CM | POA: Diagnosis not present

## 2022-06-27 DIAGNOSIS — L97822 Non-pressure chronic ulcer of other part of left lower leg with fat layer exposed: Secondary | ICD-10-CM | POA: Diagnosis not present

## 2022-06-27 DIAGNOSIS — L97322 Non-pressure chronic ulcer of left ankle with fat layer exposed: Secondary | ICD-10-CM | POA: Diagnosis not present

## 2022-06-27 DIAGNOSIS — Z89512 Acquired absence of left leg below knee: Secondary | ICD-10-CM | POA: Diagnosis not present

## 2022-06-27 DIAGNOSIS — E11621 Type 2 diabetes mellitus with foot ulcer: Secondary | ICD-10-CM | POA: Diagnosis not present

## 2022-06-27 DIAGNOSIS — E1129 Type 2 diabetes mellitus with other diabetic kidney complication: Secondary | ICD-10-CM | POA: Diagnosis not present

## 2022-06-27 DIAGNOSIS — I5042 Chronic combined systolic (congestive) and diastolic (congestive) heart failure: Secondary | ICD-10-CM | POA: Diagnosis not present

## 2022-06-27 DIAGNOSIS — L97522 Non-pressure chronic ulcer of other part of left foot with fat layer exposed: Secondary | ICD-10-CM | POA: Diagnosis not present

## 2022-06-27 DIAGNOSIS — Z89611 Acquired absence of right leg above knee: Secondary | ICD-10-CM | POA: Diagnosis not present

## 2022-06-28 DIAGNOSIS — R52 Pain, unspecified: Secondary | ICD-10-CM | POA: Diagnosis not present

## 2022-06-28 DIAGNOSIS — I998 Other disorder of circulatory system: Secondary | ICD-10-CM | POA: Diagnosis not present

## 2022-06-28 DIAGNOSIS — T148XXA Other injury of unspecified body region, initial encounter: Secondary | ICD-10-CM | POA: Diagnosis not present

## 2022-06-28 NOTE — Progress Notes (Signed)
KENSHAWN, MACIOLEK ([id number]) [phone].pdf Page 1 of 16 Visit Report for 06/27/2022 Arrival Information Details Patient Name: Date of Service: Edward [name] Edward L. 06/27/2022 3:15 PM Medical Record Number: [id number] Patient Account Number: [id number] Date of Birth/Sex: Treating RN: [name] (51 y.o. M) Primary Care Edward [name]: Edward [name] Other Clinician: Referring Edward [name]: Treating Edward [name]/Extender: Edward [name] in Treatment: 6 Visit Information History Since Last Visit All ordered tests and consults were completed: No Patient Arrived: Wheel Chair Added or deleted any medications: No Arrival Time: 15:48 Any new allergies or adverse reactions: No Accompanied By: son Had a fall or experienced change in No Transfer Assistance: None activities of daily living that Edward affect Patient Identification Verified: Yes risk of falls: Secondary Verification Process Completed: Yes Signs or symptoms of abuse/neglect since last visito No Patient Requires Transmission-Based Precautions: No Hospitalized since last visit: No Patient Has Alerts: Yes Implantable device outside of the clinic excluding No Patient Alerts: Patient on Blood Thinner cellular tissue based products placed in the center plavix since last visit: Pain Present Now: No Electronic Signature(s) Signed: 06/28/2022 9:42:47 AM By: Edward [name] Entered By: Edward [name] on 06/27/2022 15:48:34 -------------------------------------------------------------------------------- Encounter Discharge Information Details Patient Name: Date of Service: Edward [name], Edward [name] Edward L. 06/27/2022 3:15 PM Medical Record Number: [id number] Patient Account Number: [id number] Date of Birth/Sex: Treating RN: [name] (51 y.o. [name] Primary Care Edward [name]: Edward [name] Other Clinician: Referring Ryli Standlee: Treating Edward [name]/Extender: Edward [name] in Treatment: 6 Encounter  Discharge Information Items Post Procedure Vitals Discharge Condition: Stable Temperature (F): 98.5 Ambulatory Status: Wheelchair Pulse (bpm): 88 Discharge Destination: Home Respiratory Rate (breaths/min): 20 Transportation: Private Auto Blood Pressure (mmHg): 111/67 Accompanied By: son Schedule Follow-up Appointment: Yes Clinical Summary of Care: Electronic Signature(s) Signed: 06/27/2022 5:23:42 PM By: Edward Catholic RN Entered By: Edward [name] on 06/27/2022 16:46:04 Edward [name] ([id number]) [id number] Page 2 of 16 -------------------------------------------------------------------------------- Lower Extremity Assessment Details Patient Name: Date of Service: Edward [name] Edward L. 06/27/2022 3:15 PM Medical Record Number: [id number] Patient Account Number: [id number] Date of Birth/Sex: Treating RN: [name] (51 y.o. [name] Primary Care Zakayla Martinec: Edward [name] Other Clinician: Referring Edward [name]: Treating Edward [name]/Extender: Edward [name] Weeks in Treatment: 6 Edema Assessment Assessed: [Left: No] [Right: No] [Left: Edema] [Right: :] Calf Left: Right: Point of Measurement: From Medial Instep 42.5 cm Ankle Left: Right: Point of Measurement: From Medial Instep 23.2 cm Vascular Assessment Pulses: Dorsalis Pedis Palpable: [Left:Yes] Electronic Signature(s) Signed: 06/27/2022 5:23:42 PM By: Edward Catholic RN Entered By: Edward [name] on 06/27/2022 15:53:41 -------------------------------------------------------------------------------- Multi Wound Chart Details Patient Name: Date of Service: Edward [name], Edward [name] Edward L. 06/27/2022 3:15 PM Medical Record Number: [id number] Patient Account Number: [id number] Date of Birth/Sex: Treating RN: Edward [name] (51 y.o. M) Primary Care Edward [name]: Edward [name] Other Clinician: Referring Edward [name]: Treating Edward [name]/Extender: Edward [name] in Treatment:  6 Vital Signs Height(in): Capillary Blood Glucose(mg/dl): 131 Weight(lbs): Pulse(bpm): 88 Body Mass Index(BMI): Blood Pressure(mmHg): 111/67 Temperature(F): 98.5 Respiratory Rate(breaths/min): 20 [11:Photos:] [[id number].pdf Page 3 of 16] Left, Anterior Lower Leg Left Achilles Left, Circumferential Lower Leg Wound Location: Gradually Appeared Gradually Appeared Gradually Appeared Wounding Event: Diabetic Wound/Ulcer of the Lower Diabetic Wound/Ulcer of the Lower Diabetic Wound/Ulcer of the Lower Primary Etiology: Extremity Extremity Extremity Cataracts, Anemia, Sleep Apnea, Cataracts, Anemia, Sleep Apnea, Cataracts, Anemia, Sleep Apnea, Comorbid History: Arrhythmia, Congestive Heart Failure, Arrhythmia, Congestive Heart Failure, Arrhythmia, Congestive Heart Failure, Coronary Artery Disease,  Coronary Artery Disease, Coronary Artery Disease, Hypertension, Myocardial Infarction, Hypertension, Myocardial Infarction, Hypertension, Myocardial Infarction, Peripheral Venous Disease, Type II Peripheral Venous Disease, Type II Peripheral Venous Disease, Type II Diabetes, End Stage Renal Disease, Diabetes, End Stage Renal Disease, Diabetes, End Stage Renal Disease, Osteoarthritis, Osteomyelitis, Osteoarthritis, Osteomyelitis, Osteoarthritis, Osteomyelitis, Neuropathy, Confinement Anxiety Neuropathy, Confinement Anxiety Neuropathy, Confinement Anxiety 12/17/2021 06/15/2022 06/18/2022 Date Acquired: '6 1 1 '$ Weeks of Treatment: Open Open Open Wound Status: No No No Wound Recurrence: No No No Clustered Wound: 6.5x7x0.1 4.4x3x0.1 20.5x20x0.1 Measurements L x W x D (cm) 35.736 10.367 322.013 A (cm) : rea 3.574 1.037 32.201 Volume (cm) : -222.70% -100.00% -38.50% % Reduction in A rea: -222.90% -100.20% -38.50% % Reduction in Volume: Grade 2 Unable to visualize wound bed Grade 2 Classification: Medium Medium Medium Exudate A mount: Serosanguineous  Serosanguineous Serosanguineous Exudate Type: red, brown red, brown red, brown Exudate Color: Small (1-33%) Small (1-33%) Medium (34-66%) Granulation A mount: Pink Pink Pink Granulation Quality: Large (67-100%) Large (67-100%) Medium (34-66%) Necrotic A mount: Eschar, Adherent Slough Eschar, Adherent Slough Eschar, Adherent Slough Necrotic Tissue: Fat Layer (Subcutaneous Tissue): Yes Fat Layer (Subcutaneous Tissue): Yes Fat Layer (Subcutaneous Tissue): Yes Exposed Structures: Muscle: Yes Fascia: No Fascia: No Fascia: No Tendon: No Tendon: No Tendon: No Muscle: No Muscle: No Joint: No Joint: No Joint: No Bone: No Bone: No Bone: No None None None Epithelialization: Debridement - Excisional Debridement - Selective/Open Wound Debridement - Selective/Open Wound Debridement: Pre-procedure Verification/Time Out 16:20 16:20 16:20 Taken: Lidocaine 5% topical ointment Lidocaine 5% topical ointment Lidocaine 5% topical ointment Pain Control: Subcutaneous, Slough Necrotic/Eschar, [employer] Tissue Debrided: Skin/Subcutaneous Tissue Non-Viable Tissue Non-Viable Tissue Level: 45.5 13.2 410 Debridement A (sq cm): rea Curette Curette Curette Instrument: Minimum Minimum Minimum Bleeding: Pressure Pressure Pressure Hemostasis A chieved: 0 0 0 Procedural Pain: 0 0 0 Post Procedural Pain: Procedure was tolerated well Procedure was tolerated well Procedure was tolerated well Debridement Treatment Response: 6.5x7x0.1 4.4x3x0.2 20.5x20x0.1 Post Debridement Measurements L x W x D (cm) 3.574 2.073 32.201 Post Debridement Volume: (cm) Excoriation: No Periwound Skin Texture: Induration: No Callus: No Crepitus: No Rash: No Scarring: No Maceration: No Periwound Skin Moisture: Dry/Scaly: No Atrophie Blanche: No Pallor: Yes Periwound Skin Color: Cyanosis: No Ecchymosis: No Erythema: No Hemosiderin Staining: No Mottled: No Pallor: No Rubor: No N/A No Abnormality  N/A Temperature: Debridement N/A Debridement Procedures Performed: Wound Number: '14 15 16 '$ Photos: Left, Dorsal Foot Left, Proximal, Anterior Lower Leg Left, Distal, Medial Lower Leg Wound Location: Gradually Appeared Gradually Appeared Gradually Appeared Wounding Event: Diabetic Wound/Ulcer of the Lower Diabetic Wound/Ulcer of the Lower Diabetic Wound/Ulcer of the Lower Primary Etiology: DAXTYN, ROTTENBERG ([id number]) [id number] Page 4 of 16 Extremity Extremity Extremity Cataracts, Anemia, Sleep Apnea, Cataracts, Anemia, Sleep Apnea, Cataracts, Anemia, Sleep Apnea, Comorbid History: Arrhythmia, Congestive Heart Failure, Arrhythmia, Congestive Heart Failure, Arrhythmia, Congestive Heart Failure, Coronary Artery Disease, Coronary Artery Disease, Coronary Artery Disease, Hypertension, Myocardial Infarction, Hypertension, Myocardial Infarction, Hypertension, Myocardial Infarction, Peripheral Venous Disease, Type II Peripheral Venous Disease, Type II Peripheral Venous Disease, Type II Diabetes, End Stage Renal Disease, Diabetes, End Stage Renal Disease, Diabetes, End Stage Renal Disease, Osteoarthritis, Osteomyelitis, Osteoarthritis, Osteomyelitis, Osteoarthritis, Osteomyelitis, Neuropathy, Confinement Anxiety Neuropathy, Confinement Anxiety Neuropathy, Confinement Anxiety 06/18/2022 06/18/2022 06/18/2022 Date Acquired: '1 1 1 '$ Weeks of Treatment: Open Open Open Wound Status: No No No Wound Recurrence: No No Yes Clustered Wound: 1.3x1.2x0.2 1.8x2x0.1 2.8x0.6x0.1 Measurements L x W x D (cm) 1.225 2.827 1.319 A (cm) : rea 0.245  0.283 0.132 Volume (cm) : -18.10% -20.00% 44.00% % Reduction in A rea: -18.40% -19.90% 44.10% % Reduction in Volume: Grade 2 Grade 2 Grade 2 Classification: Medium Medium Medium Exudate A mount: Serosanguineous Serosanguineous Serosanguineous Exudate Type: red, brown red, brown red, brown Exudate Color: Small (1-33%) Large  (67-100%) Small (1-33%) Granulation A mount: Pink Pink Pink Granulation Quality: Large (67-100%) Small (1-33%) Large (67-100%) Necrotic A mount: Eschar, Adherent Slough Eschar, Adherent Slough Eschar, Adherent Slough Necrotic Tissue: Fat Layer (Subcutaneous Tissue): Yes Fat Layer (Subcutaneous Tissue): Yes Fat Layer (Subcutaneous Tissue): Yes Exposed Structures: Fascia: No Fascia: No Fascia: No Tendon: No Tendon: No Tendon: No Muscle: No Muscle: No Muscle: No Joint: No Joint: No Joint: No Bone: No Bone: No Bone: No None N/A N/A Epithelialization: Debridement - Excisional Debridement - Selective/Open Wound Debridement - Excisional Debridement: Pre-procedure Verification/Time Out 16:20 16:20 16:20 Taken: Lidocaine 5% topical ointment Lidocaine 5% topical ointment Lidocaine 5% topical ointment Pain Control: Subcutaneous, [employer], [employer] Tissue Debrided: Skin/Subcutaneous Tissue Non-Viable Tissue Skin/Subcutaneous Tissue Level: 1.56 3.6 1.2 Debridement A (sq cm): rea Curette Curette Curette Instrument: Minimum Minimum Minimum Bleeding: Pressure Pressure Pressure Hemostasis A chieved: 0 0 0 Procedural Pain: 0 0 0 Post Procedural Pain: Procedure was tolerated well Procedure was tolerated well Procedure was tolerated well Debridement Treatment Response: 1.3x1.2x0.2 1.8x2x0.1 2.8x0.6x0.1 Post Debridement Measurements L x W x D (cm) 0.245 0.283 0.132 Post Debridement Volume: (cm) Scarring: Yes Scarring: Yes No Abnormalities Noted Periwound Skin Texture: Maceration: Yes No Abnormalities Noted No Abnormalities Noted Periwound Skin Moisture: Hemosiderin Staining: Yes No Abnormalities Noted No Abnormalities Noted Periwound Skin Color: No Abnormality No Abnormality No Abnormality Temperature: Debridement Debridement N/A Procedures Performed: Treatment Notes Electronic Signature(s) Signed: 06/27/2022 4:39:35 PM By: Fredirick Maudlin MD  FACS Entered By: Fredirick Maudlin on 06/27/2022 16:39:34 -------------------------------------------------------------------------------- Multi-Disciplinary Care Plan Details Patient Name: Date of Service: Edward [name], Edward [name] Edward L. 06/27/2022 3:15 PM Medical Record Number: [id number] Patient Account Number: [id number] Date of Birth/Sex: Treating RN: [birth year] [name] (51 y.o. [name] Primary Care Mekesha Solomon: Edward [name] Other Clinician: Referring Kenndra Morris: Treating Kyley Laurel/Extender: Edward [name] in Treatment: [address] ([id number]) [id number] Page 5 of 16 Active Inactive Wound/Skin Impairment Nursing Diagnoses: Impaired tissue integrity Knowledge deficit related to ulceration/compromised skin integrity Goals: Patient/caregiver will verbalize understanding of skin care regimen Date Initiated: 05/15/2022 Target Resolution Date: 10/18/2022 Goal Status: Active Ulcer/skin breakdown will have a volume reduction of 30% by week 4 Date Initiated: 05/15/2022 Target Resolution Date: 10/18/2022 Goal Status: Active Interventions: Assess patient/caregiver ability to obtain necessary supplies Assess patient/caregiver ability to perform ulcer/skin care regimen upon admission and as needed Assess ulceration(s) every visit Provide education on ulcer and skin care Notes: Electronic Signature(s) Signed: 06/27/2022 5:23:42 PM By: Edward Catholic RN Entered By: Edward [name] on 06/27/2022 16:43:40 -------------------------------------------------------------------------------- Pain Assessment Details Patient Name: Date of Service: Edward [name], Edward [name] Edward L. 06/27/2022 3:15 PM Medical Record Number: [id number] Patient Account Number: [id number] Date of Birth/Sex: Treating RN: [birth year] (51 y.o. M) Primary Care Namiyah Grantham: Edward [name] Other Clinician: Referring Sigourney Portillo: Treating Felicha Frayne/Extender: Edward [name] in Treatment:  6 Active Problems Location of Pain Severity and Description of Pain Patient Has Paino No Site Locations Pain Management and Medication Current Pain Management: Electronic Signature(s) Signed: 06/28/2022 9:42:47 AM By: Edward [name] Doetsch,Signed: 06/28/2022 9:42:47 AM By: Delphina Cahill ([id number]) [id number] Page 6 of 16 Entered By: Edward [name] on 06/27/2022 15:49:44 -------------------------------------------------------------------------------- Patient/Caregiver Education Details Patient  Name: Date of Service: Edward [name] Edward L. 11/9/2023andnbsp3:15 PM Medical Record Number: [id number] Patient Account Number: [id number] Date of Birth/Gender: Treating RN: [birth year][name] (51 y.o. [name] Primary Care Physician: Edward [name] Other Clinician: Referring Physician: Treating Physician/Extender: Edward [name] in Treatment: 6 Education Assessment Education Provided To: Patient and Caregiver Education Topics Provided Wound/Skin Impairment: Handouts: Skin Care Do's and Dont's Methods: Explain/Verbal Responses: Reinforcements needed Electronic Signature(s) Signed: 06/27/2022 5:23:42 PM By: Edward Catholic RN Entered By: Edward [name] on 06/27/2022 16:43:54 -------------------------------------------------------------------------------- Wound Assessment Details Patient Name: Date of Service: Edward [name], Edward [name] Edward L. 06/27/2022 3:15 PM Medical Record Number: [id number] Patient Account Number: [id number] Date of Birth/Sex: Treating RN: [birth year][name] (51 y.o. [name] Primary Care Anu Stagner: Edward [name] Other Clinician: Referring Averey Trompeter: Treating Jaasiel Hollyfield/Extender: Edward [name] in Treatment: 6 Wound Status Wound Number: 11 Primary Diabetic Wound/Ulcer of the Lower Extremity Etiology: Wound Location: Left, Anterior Lower Leg Wound Open Wounding Event: Gradually Appeared Status: Date  Acquired: 12/17/2021 Comorbid Cataracts, Anemia, Sleep Apnea, Arrhythmia, Congestive Heart Weeks Of Treatment: 6 History: Failure, Coronary Artery Disease, Hypertension, Myocardial Clustered Wound: No Infarction, Peripheral Venous Disease, Type II Diabetes, End Stage Renal Disease, Osteoarthritis, Osteomyelitis, Neuropathy, Confinement Anxiety Photos Edward, [name] ([id number]) [id number] Page 7 of 16 Wound Measurements Length: (cm) 6.5 Width: (cm) 7 Depth: (cm) 0.1 Area: (cm) 35.736 Volume: (cm) 3.574 % Reduction in Area: -222.7% % Reduction in Volume: -222.9% Epithelialization: None Tunneling: No Undermining: No Wound Description Classification: Grade 2 Exudate Amount: Medium Exudate Type: Serosanguineous Exudate Color: red, brown Foul Odor After Cleansing: No Slough/Fibrino Yes Wound Bed Granulation Amount: Small (1-33%) Exposed Structure Granulation Quality: Pink Fascia Exposed: No Necrotic Amount: Large (67-100%) Fat Layer (Subcutaneous Tissue) Exposed: Yes Necrotic Quality: Eschar, Adherent Slough Tendon Exposed: No Muscle Exposed: Yes Necrosis of Muscle: No Joint Exposed: No Bone Exposed: No Periwound Skin Texture Texture Color No Abnormalities Noted: Yes No Abnormalities Noted: Yes Moisture No Abnormalities Noted: Yes Treatment Notes Wound #11 (Lower Leg) Wound Laterality: Left, Anterior Cleanser Soap and Water Discharge Instruction: Edward shower and wash wound with dial antibacterial soap and water prior to dressing change. Peri-Wound Care Sween Lotion (Moisturizing lotion) Discharge Instruction: Apply moisturizing lotion as directed Topical Primary Dressing IODOFLEX 0.9% Cadexomer Iodine Pad 4x6 cm Discharge Instruction: Use Iodosorb if Iodoflex not available Secondary Dressing ABD Pad, 5x9 Discharge Instruction: Apply over primary dressing as directed. Woven Gauze Sponge, Non-Sterile 4x4 in Discharge Instruction: Apply  over primary dressing as directed. Secured With Elastic Bandage 4 inch (ACE bandage) Discharge Instruction: Secure with ACE bandage as directed. Kerlix Roll Sterile, 4.5x3.1 (in/yd) Discharge Instruction: Secure with Kerlix as directed. 83M Medipore H Soft Cloth Surgical Tape, 4 x 10 (in/yd) Edward, [name] ([id number]) [phone].pdf Page 8 of 16 Discharge Instruction: Secure with tape as directed. Compression Wrap Compression Stockings Add-Ons Electronic Signature(s) Signed: 06/27/2022 5:23:42 PM By: Edward Catholic RN Entered By: Edward [name] on 06/27/2022 16:11:02 -------------------------------------------------------------------------------- Wound Assessment Details Patient Name: Date of Service: Edward [name], Edward [name] Edward L. 06/27/2022 3:15 PM Medical Record Number: [id number] Patient Account Number: [id number] Date of Birth/Sex: Treating RN: [birth year] (51 y.o. [name] Primary Care Tomara Youngberg: Edward [name] Other Clinician: Referring Tinlee Navarrette: Treating Lynton Crescenzo/Extender: Edward [name] Weeks in Treatment: 6 Wound Status Wound Number: 12 Primary Diabetic Wound/Ulcer of the Lower Extremity Etiology: Wound Location: Left Achilles Wound Open Wounding Event: Gradually Appeared Status: Date Acquired: 06/15/2022 Comorbid Cataracts, Anemia, Sleep  Apnea, Arrhythmia, Congestive Heart Weeks Of Treatment: 1 History: Failure, Coronary Artery Disease, Hypertension, Myocardial Clustered Wound: No Infarction, Peripheral Venous Disease, Type II Diabetes, End Stage Renal Disease, Osteoarthritis, Osteomyelitis, Neuropathy, Confinement Anxiety Photos Wound Measurements Length: (cm) 4.4 Width: (cm) 3 Depth: (cm) 0.1 Area: (cm) 10.367 Volume: (cm) 1.037 % Reduction in Area: -100% % Reduction in Volume: -100.2% Epithelialization: None Tunneling: No Undermining: No Wound Description Classification: Unable to visualize wound bed Exudate  Amount: Medium Exudate Type: Serosanguineous Exudate Color: red, brown Foul Odor After Cleansing: No Slough/Fibrino Yes Wound Bed Granulation Amount: Small (1-33%) Exposed Structure Granulation Quality: Pink Fascia Exposed: No Necrotic Amount: Large (67-100%) Fat Layer (Subcutaneous Tissue) Exposed: Yes Necrotic Quality: Eschar, Adherent Slough Tendon Exposed: No Muscle Exposed: No Joint Exposed: No Bone Exposed: No Edward, [name] ([id number]) [id number] Page 9 of 16 Periwound Skin Texture Texture Color No Abnormalities Noted: No No Abnormalities Noted: No Pallor: Yes Moisture No Abnormalities Noted: No Temperature / Pain Temperature: No Abnormality Treatment Notes Wound #12 (Achilles) Wound Laterality: Left Cleanser Soap and Water Discharge Instruction: Edward shower and wash wound with dial antibacterial soap and water prior to dressing change. Peri-Wound Care Sween Lotion (Moisturizing lotion) Discharge Instruction: Apply moisturizing lotion as directed Topical Primary Dressing IODOFLEX 0.9% Cadexomer Iodine Pad 4x6 cm Discharge Instruction: Use Iodosorb if Iodoflex not available Secondary Dressing ABD Pad, 5x9 Discharge Instruction: Apply over primary dressing as directed. Woven Gauze Sponge, Non-Sterile 4x4 in Discharge Instruction: Apply over primary dressing as directed. Secured With Elastic Bandage 4 inch (ACE bandage) Discharge Instruction: Secure with ACE bandage as directed. Kerlix Roll Sterile, 4.5x3.1 (in/yd) Discharge Instruction: Secure with Kerlix as directed. 55M Medipore H Soft Cloth Surgical T ape, 4 x 10 (in/yd) Discharge Instruction: Secure with tape as directed. Compression Wrap Compression Stockings Add-Ons Electronic Signature(s) Signed: 06/27/2022 5:23:42 PM By: Edward Catholic RN Entered By: Edward [name] on 06/27/2022  16:10:28 -------------------------------------------------------------------------------- Wound Assessment Details Patient Name: Date of Service: Edward [name], Edward [name] Edward L. 06/27/2022 3:15 PM Medical Record Number: [id number] Patient Account Number: [id number] Date of Birth/Sex: Treating RN: [birth year][name] (51 y.o. [name] Primary Care Zaynab Chipman: Edward [name] Other Clinician: Referring Zuri Lascala: Treating Elleni Mozingo/Extender: Edward [name] in Treatment: 6 Wound Status Wound Number: 13 Primary Diabetic Wound/Ulcer of the Lower Extremity Etiology: Wound Location: Left, Circumferential Lower Leg Wound Open Wounding Event: Gradually Appeared Status: Date Acquired: 06/18/2022 Comorbid Cataracts, Anemia, Sleep Apnea, Arrhythmia, Congestive Heart Weeks Of Treatment: 1 History: Failure, Coronary Artery Disease, Hypertension, Myocardial Clustered Wound: No Infarction, Peripheral Venous Disease, Type II Diabetes, End Stage Edward, [name] ([id number]) [id number] Page 10 of 16 Renal Disease, Osteoarthritis, Osteomyelitis, Neuropathy, Confinement Anxiety Photos Wound Measurements Length: (cm) 20.5 Width: (cm) 20 Depth: (cm) 0.1 Area: (cm) 322.013 Volume: (cm) 32.201 % Reduction in Area: -38.5% % Reduction in Volume: -38.5% Epithelialization: None Tunneling: No Undermining: No Wound Description Classification: Grade 2 Exudate Amount: Medium Exudate Type: Serosanguineous Exudate Color: red, brown Foul Odor After Cleansing: No Slough/Fibrino Yes Wound Bed Granulation Amount: Medium (34-66%) Exposed Structure Granulation Quality: Pink Fascia Exposed: No Necrotic Amount: Medium (34-66%) Fat Layer (Subcutaneous Tissue) Exposed: Yes Necrotic Quality: Eschar, Adherent Slough Tendon Exposed: No Muscle Exposed: No Joint Exposed: No Bone Exposed: No Periwound Skin Texture Texture Color No Abnormalities Noted: No No Abnormalities  Noted: No Moisture No Abnormalities Noted: No Treatment Notes Wound #13 (Lower Leg) Wound Laterality: Left, Circumferential Cleanser Soap and Water Discharge Instruction: Edward shower and wash wound with dial antibacterial  soap and water prior to dressing change. Peri-Wound Care Sween Lotion (Moisturizing lotion) Discharge Instruction: Apply moisturizing lotion as directed Topical Primary Dressing IODOFLEX 0.9% Cadexomer Iodine Pad 4x6 cm Discharge Instruction: Use Iodosorb if Iodoflex not available Secondary Dressing ABD Pad, 5x9 Discharge Instruction: Apply over primary dressing as directed. Woven Gauze Sponge, Non-Sterile 4x4 in Discharge Instruction: Apply over primary dressing as directed. Secured With Elastic Bandage 4 inch (ACE bandage) Discharge Instruction: Secure with ACE bandage as directed. Edward, [name] ([id number]) [phone].pdf Page 11 of 16 Kerlix Roll Sterile, 4.5x3.1 (in/yd) Discharge Instruction: Secure with Kerlix as directed. 545M Medipore H Soft Cloth Surgical T ape, 4 x 10 (in/yd) Discharge Instruction: Secure with tape as directed. Compression Wrap Compression Stockings Add-Ons Electronic Signature(s) Signed: 06/27/2022 5:23:42 PM By: Edward Catholic RN Entered By: Edward [name] on 06/27/2022 16:09:24 -------------------------------------------------------------------------------- Wound Assessment Details Patient Name: Date of Service: Edward [name], Edward Edward L. 06/27/2022 3:15 PM Medical Record Number: [id number] Patient Account Number: [id number] Date of Birth/Sex: Treating RN: [birth year] (51 y.o. [name] Primary Care Rhemi Balbach: Edward [name] Other Clinician: Referring Jazelyn Sipe: Treating Leisha Trinkle/Extender: Edward [name] in Treatment: 6 Wound Status Wound Number: 14 Primary Diabetic Wound/Ulcer of the Lower Extremity Etiology: Wound Location: Left, Dorsal Foot Wound Open Wounding Event: Gradually  Appeared Status: Date Acquired: 06/18/2022 Comorbid Cataracts, Anemia, Sleep Apnea, Arrhythmia, Congestive Heart Weeks Of Treatment: 1 History: Failure, Coronary Artery Disease, Hypertension, Myocardial Clustered Wound: No Infarction, Peripheral Venous Disease, Type II Diabetes, End Stage Renal Disease, Osteoarthritis, Osteomyelitis, Neuropathy, Confinement Anxiety Photos Wound Measurements Length: (cm) 1.3 Width: (cm) 1.2 Depth: (cm) 0.2 Area: (cm) 1.225 Volume: (cm) 0.245 % Reduction in Area: -18.1% % Reduction in Volume: -18.4% Epithelialization: None Tunneling: No Undermining: No Wound Description Classification: Grade 2 Exudate Amount: Medium Exudate Type: Serosanguineous Exudate Color: red, brown Wound Bed Granulation Amount: Small (1-33%) Exposed Structure Granulation Quality: Pink Fascia Exposed: No Necrotic Amount: Large (67-100%) Fat Layer (Subcutaneous Tissue) Exposed: Yes Necrotic Quality: [place], Adherent Slough Tendon Exposed: No Edward, [name] ([id number]) [id number] Page 12 of 16 Muscle Exposed: No Joint Exposed: No Bone Exposed: No Periwound Skin Texture Texture Color No Abnormalities Noted: No No Abnormalities Noted: No Scarring: Yes Hemosiderin Staining: Yes Moisture Temperature / Pain No Abnormalities Noted: No Temperature: No Abnormality Maceration: Yes Treatment Notes Wound #14 (Foot) Wound Laterality: Dorsal, Left Cleanser Soap and Water Discharge Instruction: Edward shower and wash wound with dial antibacterial soap and water prior to dressing change. Peri-Wound Care Sween Lotion (Moisturizing lotion) Discharge Instruction: Apply moisturizing lotion as directed Topical Primary Dressing IODOFLEX 0.9% Cadexomer Iodine Pad 4x6 cm Discharge Instruction: Use Iodosorb if Iodoflex not available Secondary Dressing ABD Pad, 5x9 Discharge Instruction: Apply over primary dressing as directed. Woven Gauze Sponge,  Non-Sterile 4x4 in Discharge Instruction: Apply over primary dressing as directed. Secured With Elastic Bandage 4 inch (ACE bandage) Discharge Instruction: Secure with ACE bandage as directed. Kerlix Roll Sterile, 4.5x3.1 (in/yd) Discharge Instruction: Secure with Kerlix as directed. 545M Medipore H Soft Cloth Surgical T ape, 4 x 10 (in/yd) Discharge Instruction: Secure with tape as directed. Compression Wrap Compression Stockings Add-Ons Electronic Signature(s) Signed: 06/27/2022 5:23:42 PM By: Edward Catholic RN Entered By: Edward [name] on 06/27/2022 16:06:25 -------------------------------------------------------------------------------- Wound Assessment Details Patient Name: Date of Service: Edward [name], Edward [name] Edward L. 06/27/2022 3:15 PM Medical Record Number: [id number] Patient Account Number: [id number] Date of Birth/Sex: Treating RN: [name] (51 y.o. [name] Primary Care Deidrick Rainey: Edward [name] Other Clinician: Referring  Daena Alper: Treating Caitlynne Harbeck/Extender: Edward [name] Weeks in Treatment: 6 Wound Status Wound Number: 15 Primary Diabetic Wound/Ulcer of the Lower Extremity Etiology: Wound Location: Left, Proximal, Anterior Lower Leg Edward, [name] ([id number]) [id number] Page 13 of 16 Wound Open Wounding Event: Gradually Appeared Status: Date Acquired: 06/18/2022 Comorbid Cataracts, Anemia, Sleep Apnea, Arrhythmia, Congestive Heart Weeks Of Treatment: 1 History: Failure, Coronary Artery Disease, Hypertension, Myocardial Clustered Wound: No Infarction, Peripheral Venous Disease, Type II Diabetes, End Stage Renal Disease, Osteoarthritis, Osteomyelitis, Neuropathy, Confinement Anxiety Photos Wound Measurements Length: (cm) 1.8 Width: (cm) 2 Depth: (cm) 0.1 Area: (cm) 2.827 Volume: (cm) 0.283 % Reduction in Area: -20% % Reduction in Volume: -19.9% Tunneling: No Undermining: No Wound Description Classification:  Grade 2 Exudate Amount: Medium Exudate Type: Serosanguineous Exudate Color: red, brown Foul Odor After Cleansing: No Slough/Fibrino Yes Wound Bed Granulation Amount: Large (67-100%) Exposed Structure Granulation Quality: Pink Fascia Exposed: No Necrotic Amount: Small (1-33%) Fat Layer (Subcutaneous Tissue) Exposed: Yes Necrotic Quality: [place], Adherent Slough Tendon Exposed: No Muscle Exposed: No Joint Exposed: No Bone Exposed: No Periwound Skin Texture Texture Color No Abnormalities Noted: No No Abnormalities Noted: Yes Scarring: Yes Temperature / Pain Temperature: No Abnormality Moisture No Abnormalities Noted: Yes Treatment Notes Wound #15 (Lower Leg) Wound Laterality: Left, Anterior, Proximal Cleanser Soap and Water Discharge Instruction: Edward shower and wash wound with dial antibacterial soap and water prior to dressing change. Peri-Wound Care Sween Lotion (Moisturizing lotion) Discharge Instruction: Apply moisturizing lotion as directed Topical Primary Dressing IODOFLEX 0.9% Cadexomer Iodine Pad 4x6 cm Discharge Instruction: Use Iodosorb if Iodoflex not available Secondary Dressing ABD Pad, 5x9 Discharge Instruction: Apply over primary dressing as directed. Woven Gauze Sponge, Non-Sterile 4x4 in Discharge Instruction: Apply over primary dressing as directed. NIRAV, SWEDA ([id number]) [phone].pdf Page 14 of 16 Secured With Elastic Bandage 4 inch (ACE bandage) Discharge Instruction: Secure with ACE bandage as directed. Kerlix Roll Sterile, 4.5x3.1 (in/yd) Discharge Instruction: Secure with Kerlix as directed. 44M Medipore H Soft Cloth Surgical T ape, 4 x 10 (in/yd) Discharge Instruction: Secure with tape as directed. Compression Wrap Compression Stockings Add-Ons Electronic Signature(s) Signed: 06/27/2022 5:23:42 PM By: Edward Catholic RN Entered By: Edward [name] on 06/27/2022  16:05:51 -------------------------------------------------------------------------------- Wound Assessment Details Patient Name: Date of Service: Edward [name], Edward [name] Edward L. 06/27/2022 3:15 PM Medical Record Number: [id number] Patient Account Number: [id number] Date of Birth/Sex: Treating RN: [name] (51 y.o. [name] Primary Care Edward [name]: Edward [name] Other Clinician: Referring Senya Hinzman: Treating Taiwana Willison/Extender: Edward [name] in Treatment: 6 Wound Status Wound Number: 16 Primary Diabetic Wound/Ulcer of the Lower Extremity Etiology: Wound Location: Left, Distal, Medial Lower Leg Wound Open Wounding Event: Gradually Appeared Status: Date Acquired: 06/18/2022 Comorbid Cataracts, Anemia, Sleep Apnea, Arrhythmia, Congestive Heart Weeks Of Treatment: 1 History: Failure, Coronary Artery Disease, Hypertension, Myocardial Clustered Wound: Yes Infarction, Peripheral Venous Disease, Type II Diabetes, End Stage Renal Disease, Osteoarthritis, Osteomyelitis, Neuropathy, Confinement Anxiety Photos Wound Measurements Length: (cm) 2.8 Width: (cm) 0.6 Depth: (cm) 0.1 Area: (cm) 1.319 Volume: (cm) 0.132 % Reduction in Area: 44% % Reduction in Volume: 44.1% Tunneling: No Undermining: No Wound Description Classification: Grade 2 Exudate Amount: Medium Exudate Type: Serosanguineous Exudate Color: red, brown Edward, REZA L ([id number]) Wound Bed Granulation Amount: Small (1-33%) Granulation Quality: Pink Necrotic Amount: Large (67-100%) Necrotic Quality: [place], Adherent Slough Foul Odor After Cleansing: No Slough/Fibrino Yes [id number].pdf Page 15 of 16 Exposed Structure Fascia Exposed: No Fat Layer (Subcutaneous Tissue) Exposed: Yes Tendon Exposed: No  Muscle Exposed: No Joint Exposed: No Bone Exposed: No Periwound Skin Texture Texture Color No Abnormalities Noted: Yes No Abnormalities Noted: Yes Moisture Temperature /  Pain No Abnormalities Noted: Yes Temperature: No Abnormality Treatment Notes Wound #16 (Lower Leg) Wound Laterality: Left, Medial, Distal Cleanser Soap and Water Discharge Instruction: Edward shower and wash wound with dial antibacterial soap and water prior to dressing change. Peri-Wound Care Sween Lotion (Moisturizing lotion) Discharge Instruction: Apply moisturizing lotion as directed Topical Primary Dressing IODOFLEX 0.9% Cadexomer Iodine Pad 4x6 cm Discharge Instruction: Use Iodosorb if Iodoflex not available Secondary Dressing ABD Pad, 5x9 Discharge Instruction: Apply over primary dressing as directed. Woven Gauze Sponge, Non-Sterile 4x4 in Discharge Instruction: Apply over primary dressing as directed. Secured With Elastic Bandage 4 inch (ACE bandage) Discharge Instruction: Secure with ACE bandage as directed. Kerlix Roll Sterile, 4.5x3.1 (in/yd) Discharge Instruction: Secure with Kerlix as directed. 19M Medipore H Soft Cloth Surgical T ape, 4 x 10 (in/yd) Discharge Instruction: Secure with tape as directed. Compression Wrap Compression Stockings Add-Ons Electronic Signature(s) Signed: 06/27/2022 5:23:42 PM By: Edward Catholic RN Entered By: Edward [name] on 06/27/2022 16:05:25 -------------------------------------------------------------------------------- Vitals Details Patient Name: Date of Service: Edward [name], Edward Edward L. 06/27/2022 3:15 PM Medical Record Number: [id number] Patient Account Number: [id number] Date of Birth/Sex: Treating RN: [birth year] (51 y.o. M) Primary Care Nivin Braniff: Edward [name] Other Clinician: Referring Raianna Slight: Treating Dechelle Attaway/Extender: Edward [name] in Treatment: [address] ([id number]) [id number] Page 16 of 16 Vital Signs Time Taken: 03:45 Temperature (F): 98.5 Pulse (bpm): 88 Respiratory Rate (breaths/min): 20 Blood Pressure (mmHg): 111/67 Capillary Blood Glucose (mg/dl):  131 Reference Range: 80 - 120 mg / dl Electronic Signature(s) Signed: 06/28/2022 9:42:47 AM By: Edward [name] Entered By: Edward [name] on 06/27/2022 15:49:34

## 2022-06-28 NOTE — Progress Notes (Signed)
JUDDSON, COBERN ([id number]) [id number] Page 1 of 18 Visit Report for 06/27/2022 Chief Complaint Document Details Patient Name: Date of Service: Edward [name] MES L. 06/27/2022 3:15 PM Medical Record Number: [id number] Patient Account Number: [id number] Date of Birth/Sex: Treating RN: Mar 06, 1971 (51 y.o. M) Primary Care Provider: [name] Other Clinician: Referring Provider: Treating Provider/Extender: Kevan Ny in Treatment: 6 Information Obtained from: Patient Chief Complaint Patients presents for treatment of an open diabetic ulcer Electronic Signature(s) Signed: 06/27/2022 4:40:57 PM By: Fredirick Maudlin MD FACS Entered By: Fredirick Maudlin on 06/27/2022 16:40:57 -------------------------------------------------------------------------------- Debridement Details Patient Name: Date of Service: Edward [name], Edward MES L. 06/27/2022 3:15 PM Medical Record Number: [id number] Patient Account Number: [id number] Date of Birth/Sex: Treating RN: 02/09/1971 (51 y.o. Edward [name] Primary Care Provider: [name] Other Clinician: Referring Provider: Treating Provider/Extender: Kevan Ny in Treatment: 6 Debridement Performed for Assessment: Wound #11 Left,Anterior Lower Leg Performed By: Physician Fredirick Maudlin, MD Debridement Type: Debridement Severity of Tissue Pre Debridement: Fat layer exposed Level of Consciousness (Pre-procedure): Awake and Alert Pre-procedure Verification/Time Out Yes - 16:20 Taken: Start Time: 16:20 Pain Control: Lidocaine 5% topical ointment T Area Debrided (L x W): otal 6.5 (cm) x 7 (cm) = 45.5 (cm) Tissue and other material debrided: Non-Viable, Slough, Subcutaneous, Slough Level: Skin/Subcutaneous Tissue Debridement Description: Excisional Instrument: Curette Bleeding: Minimum Hemostasis Achieved: Pressure End Time: 16:24 Procedural Pain: 0 Post Procedural Pain:  0 Response to Treatment: Procedure was tolerated well Level of Consciousness (Post- Awake and Alert procedure): Post Debridement Measurements of Total Wound Length: (cm) 6.5 Width: (cm) 7 Depth: (cm) 0.1 Volume: (cm) 3.574 Character of Wound/Ulcer Post Debridement: Improved Severity of Tissue Post Debridement: Fat layer exposed Edward [name], Edward [name] ([id number]) [id number] Page 2 of 18 Post Procedure Diagnosis Same as Pre-procedure Notes Scribed for Dr. Celine Edward [name] by J.Scotton Electronic Signature(s) Signed: 06/27/2022 4:54:29 PM By: Fredirick Maudlin MD FACS Signed: 06/27/2022 5:23:42 PM By: Dellie Catholic RN Entered By: Dellie Catholic on 06/27/2022 16:35:17 -------------------------------------------------------------------------------- Debridement Details Patient Name: Date of Service: Edward [name], Edward [name] MES L. 06/27/2022 3:15 PM Medical Record Number: [id number] Patient Account Number: [id number] Date of Birth/Sex: Treating RN: October 13, 1970 (51 y.o. Edward [name] Primary Care Provider: [name] Other Clinician: Referring Provider: Treating Provider/Extender: Kevan Ny in Treatment: 6 Debridement Performed for Assessment: Wound #14 Left,Dorsal Foot Performed By: Physician Fredirick Maudlin, MD Debridement Type: Debridement Severity of Tissue Pre Debridement: Fat layer exposed Level of Consciousness (Pre-procedure): Awake and Alert Pre-procedure Verification/Time Out Yes - 16:20 Taken: Start Time: 16:20 Pain Control: Lidocaine 5% topical ointment T Area Debrided (L x W): otal 1.3 (cm) x 1.2 (cm) = 1.56 (cm) Tissue and other material debrided: Non-Viable, Slough, Subcutaneous, Slough Level: Skin/Subcutaneous Tissue Debridement Description: Excisional Instrument: Curette Bleeding: Minimum Hemostasis Achieved: Pressure End Time: 16:24 Procedural Pain: 0 Post Procedural Pain: 0 Response to Treatment: Procedure was  tolerated well Level of Consciousness (Post- Awake and Alert procedure): Post Debridement Measurements of Total Wound Length: (cm) 1.3 Width: (cm) 1.2 Depth: (cm) 0.2 Volume: (cm) 0.245 Character of Wound/Ulcer Post Debridement: Improved Severity of Tissue Post Debridement: Fat layer exposed Post Procedure Diagnosis Same as Pre-procedure Notes Scribed for Dr. Celine Edward [name] by J.Scotton Electronic Signature(s) Signed: 06/27/2022 4:54:29 PM By: Fredirick Maudlin MD FACS Signed: 06/27/2022 5:23:42 PM By: Dellie Catholic RN Entered By: Dellie Catholic on 06/27/2022 16:37:20 Edward [name] ([id number]) [id number] Page 3 of 18 -------------------------------------------------------------------------------- Debridement Details Patient  Name: Date of Service: Edward [name] MES L. 06/27/2022 3:15 PM Medical Record Number: [id number] Patient Account Number: [id number] Date of Birth/Sex: Treating RN: January 21, 1971 (51 y.o. Edward [name] Primary Care Provider: [name] Other Clinician: Referring Provider: Treating Provider/Extender: Kevan Ny in Treatment: 6 Debridement Performed for Assessment: Wound #15 Left,Proximal,Anterior Lower Leg Performed By: Physician Fredirick Maudlin, MD Debridement Type: Debridement Severity of Tissue Pre Debridement: Fat layer exposed Level of Consciousness (Pre-procedure): Awake and Alert Pre-procedure Verification/Time Out Yes - 16:20 Taken: Start Time: 16:20 Pain Control: Lidocaine 5% topical ointment T Area Debrided (L x W): otal 1.8 (cm) x 2 (cm) = 3.6 (cm) Tissue and other material debrided: Non-Viable, Slough, Slough Level: Non-Viable Tissue Debridement Description: Selective/Open Wound Instrument: Curette Bleeding: Minimum Hemostasis Achieved: Pressure End Time: 16:24 Procedural Pain: 0 Post Procedural Pain: 0 Response to Treatment: Procedure was tolerated well Level of Consciousness  (Post- Awake and Alert procedure): Post Debridement Measurements of Total Wound Length: (cm) 1.8 Width: (cm) 2 Depth: (cm) 0.1 Volume: (cm) 0.283 Character of Wound/Ulcer Post Debridement: Improved Severity of Tissue Post Debridement: Fat layer exposed Post Procedure Diagnosis Same as Pre-procedure Notes Scribed foir Dr. Celine Edward [name] by J.Scotton Electronic Signature(s) Signed: 06/27/2022 4:54:29 PM By: Fredirick Maudlin MD FACS Signed: 06/27/2022 5:23:42 PM By: Dellie Catholic RN Entered By: Dellie Catholic on 06/27/2022 16:38:27 -------------------------------------------------------------------------------- Debridement Details Patient Name: Date of Service: Edward [name], Edward MES L. 06/27/2022 3:15 PM Medical Record Number: [id number] Patient Account Number: [id number] Date of Birth/Sex: Treating RN: November 08, 1970 (51 y.o. Edward [name] Primary Care Provider: [name] Other Clinician: Referring Provider: Treating Provider/Extender: Kevan Ny in Treatment: 6 Debridement Performed for Assessment: Wound #13 Left,Circumferential Lower Leg Performed By: Physician Fredirick Maudlin, MD Debridement Type: Debridement Edward [name], Edward [name] ([id number]) [id number] Page 4 of 18 Severity of Tissue Pre Debridement: Fat layer exposed Level of Consciousness (Pre-procedure): Awake and Alert Pre-procedure Verification/Time Out Yes - 16:20 Taken: Start Time: 16:20 Pain Control: Lidocaine 5% topical ointment T Area Debrided (L x W): otal 20 (cm) x 15 (cm) = 300 (cm) Tissue and other material debrided: Non-Viable, Slough, Slough Level: Non-Viable Tissue Debridement Description: Selective/Open Wound Instrument: Curette Bleeding: Minimum Hemostasis Achieved: Pressure End Time: 16:24 Procedural Pain: 0 Post Procedural Pain: 0 Response to Treatment: Procedure was tolerated well Level of Consciousness (Post- Awake and Alert procedure): Post  Debridement Measurements of Total Wound Length: (cm) 20.5 Width: (cm) 20 Depth: (cm) 0.1 Volume: (cm) 32.201 Character of Wound/Ulcer Post Debridement: Improved Severity of Tissue Post Debridement: Fat layer exposed Post Procedure Diagnosis Same as Pre-procedure Notes Scribed for Dr. Celine Edward [name] by J.Scotton Electronic Signature(s) Signed: 06/27/2022 4:54:29 PM By: Fredirick Maudlin MD FACS Signed: 06/27/2022 5:23:42 PM By: Dellie Catholic RN Entered By: Dellie Catholic on 06/27/2022 16:41:30 -------------------------------------------------------------------------------- Debridement Details Patient Name: Date of Service: Edward [name], Edward [name] MES L. 06/27/2022 3:15 PM Medical Record Number: [id number] Patient Account Number: [id number] Date of Birth/Sex: Treating RN: 1970-12-11 (51 y.o. Edward [name] Primary Care Provider: [name] Other Clinician: Referring Provider: Treating Provider/Extender: Kevan Ny in Treatment: 6 Debridement Performed for Assessment: Wound #12 Left Achilles Performed By: Physician Fredirick Maudlin, MD Debridement Type: Debridement Severity of Tissue Pre Debridement: Fat layer exposed Level of Consciousness (Pre-procedure): Awake and Alert Pre-procedure Verification/Time Out Yes - 16:20 Taken: Start Time: 16:20 Pain Control: Lidocaine 5% topical ointment T Area Debrided (L x W): otal 4.4 (cm) x 3 (cm) =  13.2 (cm) Tissue and other material debrided: Non-Viable, Eschar, Slough, Slough Level: Non-Viable Tissue Debridement Description: Selective/Open Wound Instrument: Curette Bleeding: Minimum Hemostasis Achieved: Pressure End Time: 16:24 Procedural Pain: 0 Post Procedural Pain: 0 Response to Treatment: Procedure was tolerated well Level of Consciousness (Post- Awake and Alert Edward [name], Edward [name] ([id number]) [id number] Page 5 of 18 Awake and Alert procedure): Post Debridement Measurements of Total  Wound Length: (cm) 4.4 Width: (cm) 3 Depth: (cm) 0.2 Volume: (cm) 2.073 Character of Wound/Ulcer Post Debridement: Requires Further Debridement Severity of Tissue Post Debridement: Fat layer exposed Post Procedure Diagnosis Same as Pre-procedure Notes Scribed for Dr. Celine Edward [name] by J.Scotton Electronic Signature(s) Signed: 06/27/2022 4:54:29 PM By: Fredirick Maudlin MD FACS Signed: 06/27/2022 5:23:42 PM By: Dellie Catholic RN Entered By: Dellie Catholic on 06/27/2022 16:42:04 -------------------------------------------------------------------------------- Debridement Details Patient Name: Date of Service: Edward [name], Edward [name] MES L. 06/27/2022 3:15 PM Medical Record Number: [id number] Patient Account Number: [id number] Date of Birth/Sex: Treating RN: [birth year] (51 y.o. Edward [name] Primary Care Provider: [name] Other Clinician: Referring Provider: Treating Provider/Extender: Kevan Ny in Treatment: 6 Debridement Performed for Assessment: Wound #16 Left,Distal,Medial Lower Leg Performed By: Physician Fredirick Maudlin, MD Debridement Type: Debridement Severity of Tissue Pre Debridement: Fat layer exposed Level of Consciousness (Pre-procedure): Awake and Alert Pre-procedure Verification/Time Out Yes - 16:20 Taken: Start Time: 16:20 Pain Control: Lidocaine 5% topical ointment T Area Debrided (L x W): otal 2 (cm) x 0.6 (cm) = 1.2 (cm) Tissue and other material debrided: Non-Viable, Slough, Subcutaneous, Slough Level: Skin/Subcutaneous Tissue Debridement Description: Excisional Instrument: Curette Bleeding: Minimum Hemostasis Achieved: Pressure End Time: 16:24 Procedural Pain: 0 Post Procedural Pain: 0 Response to Treatment: Procedure was tolerated well Level of Consciousness (Post- Awake and Alert procedure): Post Debridement Measurements of Total Wound Length: (cm) 2.8 Width: (cm) 0.6 Depth: (cm) 0.1 Volume: (cm) 0.132 Character of  Wound/Ulcer Post Debridement: Improved Severity of Tissue Post Debridement: Fat layer exposed Post Procedure Diagnosis Same as Pre-procedure Notes Scribed for Dr. Celine Edward [name] by J.Scotton Chern, [place] ([id number]) [id number] Page 6 of 18 Electronic Signature(s) Signed: 06/27/2022 4:54:29 PM By: Fredirick Maudlin MD FACS Signed: 06/27/2022 5:23:42 PM By: Dellie Catholic RN Entered By: Dellie Catholic on 06/27/2022 16:42:53 -------------------------------------------------------------------------------- HPI Details Patient Name: Date of Service: Edward [name], Edward MES L. 06/27/2022 3:15 PM Medical Record Number: [id number] Patient Account Number: [id number] Date of Birth/Sex: Treating RN: 11/30/1970 (51 y.o. M) Primary Care Provider: [name] Other Clinician: Referring Provider: Treating Provider/Extender: Kevan Ny in Treatment: 6 History of Present Illness HPI Description: Admission 09/24/2021 Mr. 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 He developed cellulitis to the left fourth finger and was treated with doxycycline at urgent care. The nail is coming off. He reports taking the antibiotics currently. He has been keeping the area covered. He also presents with 2 left lower extremity wounds. He reports that the more proximal wound has been present for several months and the more distal wound has developed more recently over the past several weeks. He had an abdominal aortogram to the left lower extremity with laser atherectomy to the left popliteal, left tibioperoneal trunk and left peroneal arteries, drug-coated balloon angioplasty of the left popliteal artery and plain balloon angioplasty of the left tibioperoneal trunk and left peroneal artery. He  has not followed up with vein and vascular since his procedure on 04/09/2021. He currently denies signs of infection. He currently denies pain. 11/28/2021: The patient has not been seen since his initial admission. He has not followed up with vascular surgery, either. He did undergo amputation of his left fourth finger and is currently under the care of orthopedics and occupational therapy for this. He presents today with multiple wounds on his bilateral lower extremities. He is very fluid overloaded, based upon the appearance of his extremities. These all seem to have started as blisters that have subsequently opened and caused ulcers. He does have a shrinker sock for his right BKA, but he is unable to wear it secondary to pain. He also has a compression stocking for his left lower extremity but he has been unable to apply it due to the amputation on his left hand. The largest and most painful of these wounds is just above the knee on the right. All of the wounds have eschar and some slough present. No overt signs of infection. 12/05/2021: All of the wounds look about the same. He has new blisters that have opened up revealing denuded skin underneath. The eschar is extremely hard and where there is not eschar, the wounds are gritty. He was unable to tolerate the Ace wrap at the tightness that we applied it last week and ended up having to have it loosened. He has been tolerating Kerlix and Coban on the left leg. He has not received a follow-up appointment with vascular surgery yet. We have been using Santyl under Hydrofera Blue. 12/12/2021: We have been unable to secure home health care assistance. The patient and his son are doing his wound care at home. He does have a follow-up visit in vascular surgery on May 24. He appears to be less fluid overloaded today. The wound on his right medial thigh looks bigger today and has a thick layer of hard dark eschar. The wound on his lateral right stump looks  somewhat improved with softer eschar and slough. There is also slough on the wound overlying the end of the stump and wrapping around to the backside of it. On his left leg, the edema control is improved. The wound on his anterior tibial surface is small and clean. On his dorsal and medial ankle, there is slough buildup. 12/19/2021: The wound on his left anterior tibial surface is closed. The wound on his dorsal left foot looks about the same with just a bit of slough buildup. The surface is gritty. The wound on his right medial thigh looks about the same today and has reaccumulated the hard dark eschar, despite vigorous debridement last week. The wound on the right lateral stump is about the same and has less eschar and slough. Overlying the end of the stump and wrapping around the backside of it, the wounds have converged. Overall, his edema control is much better than it has been and certainly than it was on the first visit. 12/26/2021: The wound on his dorsal left foot has a bit of slough accumulation but the surface is less gritty. The wound on his left medial calf is clean with a good bit of granulation tissue. The wounds on his right thigh and stump continue to display a heavy thick layer of black eschar. 01/01/2022: The wound on his dorsal left foot is fairly clean but still has a gritty surface. The wound on his left medial calf is nearly closed with just a little bit of overlying  eschar. Unfortunately, the wounds on his right thigh and stump have deteriorated. 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 He has a fluctuant area near the proximal part of his medial thigh wound. There is an odor coming from that leg but I am not sure which of the wounds is producing it or if both of them are. READMISSION 05/15/2022 Since he was last seen in our clinic, the patient has undergone multiple amputations of various fingers as well as revision of his right  BKA to an above-knee amputation. He is now back for further evaluation and management of 3 small wounds on his left lower leg, just above the ankle on the anterior tibial surface. They are very fibrotic and dry. Muscle fibers are visible in the most proximal wound. There is slough and leathery eschar accumulation present. 06/20/2022: Mr. Durden returns to clinic after having been in the hospital for quite some time. He has returned with multiple new wounds. The largest of these is a nearly circumferential wound on the mid to lower portion of his left lower leg. He has wounds on his Achilles tendon area with muscle and tendon exposed. The anterior ankle wound has thick black eschar accumulated on the surface. The dorsal foot wound also has slough and eschar buildup. All of the wound surfaces are incredibly fibrotic and do not look particularly viable. 06/27/2022: All of the wounds are bigger this week, but most of them have an improved surface, with less fibrosis and even some pink coloration, particularly the circumferential leg wound. The anterior ankle wound has some tendon exposure underneath fibrinous slough in the proximal anterior leg wound remains fairly dry and tough. Electronic Signature(s) Signed: 06/27/2022 4:42:27 PM By: Fredirick Maudlin MD FACS Entered By: Fredirick Maudlin on 06/27/2022 16:42:27 Edward [name] ([id number]) [id number] Page 7 of 18 -------------------------------------------------------------------------------- Physical Exam Details Patient Name: Date of Service: Edward [name] MES L. 06/27/2022 3:15 PM Medical Record Number: [id number] Patient Account Number: [id number] Date of Birth/Sex: Treating RN: 10-Apr-1971 (51 y.o. M) Primary Care Provider: [name] Other Clinician: Referring Provider: Treating Provider/Extender: Kevan Ny in Treatment: 6 Constitutional . . . . No acute distress.Marland Kitchen Respiratory Normal  work of breathing on room air.. Notes 06/27/2022: All of the wounds are bigger this week, but most of them have an improved surface, with less fibrosis and even some pink coloration, particularly the circumferential leg wound. The anterior ankle wound has some tendon exposure underneath fibrinous slough in the proximal anterior leg wound remains fairly dry and tough. Electronic Signature(s) Signed: 06/27/2022 4:47:34 PM By: Fredirick Maudlin MD FACS Entered By: Fredirick Maudlin on 06/27/2022 16:47:34 -------------------------------------------------------------------------------- Physician Orders Details Patient Name: Date of Service: Edward [name], Edward MES L. 06/27/2022 3:15 PM Medical Record Number: [id number] Patient Account Number: [id number] Date of Birth/Sex: Treating RN: [birth year] (51 y.o. 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 without evidence of necrosis N18.6 End stage renal disease Z89.611 Acquired absence of right leg above knee Z89.432 Acquired absence of left foot I50.42 Chronic combined systolic (congestive) and diastolic (congestive) heart failure I73.9 Peripheral vascular disease, unspecified E11.52 Type 2 diabetes mellitus with diabetic peripheral angiopathy with gangrene L97.522 Non-pressure chronic ulcer  of other part of left foot with fat layer exposed L97.325 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 - Dr. Celine Edward [name] Rm 3 Return A Anesthetic Wound #11 Left,Anterior Lower Leg (In clinic)  Topical Lidocaine 5% applied to wound bed Edward [name], Edward [name] ([id number]) [id number] Page 8 of 18 Bathing/ Shower/ Hygiene May shower and wash wound with soap and water. - use antibacterial soap to wash legs and wounds when changing dressing. right leg Edema Control - Lymphedema / SCD / Other Elevate legs to the level of the heart or above for 30 minutes daily and/or when sitting, a frequency of: - throughout the day Avoid standing for long periods of time. Additional Orders / Instructions Follow Nutritious Diet - Monitor/Control Blood Sugars Wound Treatment Wound #11 - Lower Leg Wound Laterality: Left, Anterior Cleanser: Soap and Water Every Other Day/30 Days Discharge Instructions: May shower and wash wound with dial antibacterial soap and water prior to dressing change. Peri-Wound Care: Sween Lotion (Moisturizing lotion) Every Other Day/30 Days Discharge Instructions: Apply moisturizing lotion as directed Prim Dressing: IODOFLEX 0.9% Cadexomer Iodine Pad 4x6 cm (Generic) Every Other Day/30 Days ary Discharge Instructions: Use Iodosorb if Iodoflex not available Secondary Dressing: ABD Pad, 5x9 (Generic) Every Other Day/30 Days Discharge Instructions: Apply over primary dressing as directed. Secondary Dressing: Woven Gauze Sponge, Non-Sterile 4x4 in (Generic) Every Other Day/30 Days Discharge Instructions: Apply over primary dressing as directed. Secured With: Elastic Bandage 4 inch (ACE bandage) (Generic) Every Other Day/30 Days Discharge Instructions: Secure with ACE bandage as directed. Secured With: [employer], 4.5x3.1 (in/yd) (Generic) Every Other Day/30 Days Discharge Instructions: Secure with Kerlix as directed. Secured With: 24M Medipore H Soft Cloth Surgical T ape, 4 x 10 (in/yd) (Generic) Every Other Day/30 Days Discharge Instructions: Secure with tape as directed. Wound #12 - Achilles Wound Laterality: Left Cleanser: Soap and Water Every Other  Day/30 Days Discharge Instructions: May shower and wash wound with dial antibacterial soap and water prior to dressing change. Peri-Wound Care: Sween Lotion (Moisturizing lotion) Every Other Day/30 Days Discharge Instructions: Apply moisturizing lotion as directed Prim Dressing: IODOFLEX 0.9% Cadexomer Iodine Pad 4x6 cm (Generic) Every Other Day/30 Days ary Discharge Instructions: Use Iodosorb if Iodoflex not available Secondary Dressing: ABD Pad, 5x9 (Generic) Every Other Day/30 Days Discharge Instructions: Apply over primary dressing as directed. Secondary Dressing: Woven Gauze Sponge, Non-Sterile 4x4 in (Generic) Every Other Day/30 Days Discharge Instructions: Apply over primary dressing as directed. Secured With: Elastic Bandage 4 inch (ACE bandage) (Generic) Every Other Day/30 Days Discharge Instructions: Secure with ACE bandage as directed. Secured With: [employer], 4.5x3.1 (in/yd) (Generic) Every Other Day/30 Days Discharge Instructions: Secure with Kerlix as directed. Secured With: 24M Medipore H Soft Cloth Surgical T ape, 4 x 10 (in/yd) (Generic) Every Other Day/30 Days Discharge Instructions: Secure with tape as directed. Wound #13 - Lower Leg Wound Laterality: Left, Circumferential Cleanser: Soap and Water Every Other Day/30 Days Discharge Instructions: May shower and wash wound with dial antibacterial soap and water prior to dressing change. Peri-Wound Care: Sween Lotion (Moisturizing lotion) Every Other Day/30 Days Discharge Instructions: Apply moisturizing lotion as directed Prim Dressing: IODOFLEX 0.9% Cadexomer Iodine Pad 4x6 cm (Generic) Every Other Day/30 Days ary Discharge Instructions: Use Iodosorb if Iodoflex not available Secondary Dressing: ABD Pad, 5x9 (Generic) Every Other Day/30 Days Discharge Instructions: Apply over  primary dressing as directed. Secondary Dressing: Woven Gauze Sponge, Non-Sterile 4x4 in (Generic) Every Other Day/30 Days Edward [name], Edward [name]  ([id number]) [id number] Page 9 of 18 Discharge Instructions: Apply over primary dressing as directed. Secured With: Elastic Bandage 4 inch (ACE bandage) (Generic) Every Other Day/30 Days Discharge Instructions: Secure with ACE bandage as directed. Secured With: [employer], 4.5x3.1 (in/yd) (Generic) Every Other Day/30 Days Discharge Instructions: Secure with Kerlix as directed. Secured With: 55M Medipore H Soft Cloth Surgical T ape, 4 x 10 (in/yd) (Generic) Every Other Day/30 Days Discharge Instructions: Secure with tape as directed. Wound #14 - Foot Wound Laterality: Dorsal, Left Cleanser: Soap and Water Every Other Day/30 Days Discharge Instructions: May shower and wash wound with dial antibacterial soap and water prior to dressing change. Peri-Wound Care: Sween Lotion (Moisturizing lotion) Every Other Day/30 Days Discharge Instructions: Apply moisturizing lotion as directed Prim Dressing: IODOFLEX 0.9% Cadexomer Iodine Pad 4x6 cm (Generic) Every Other Day/30 Days ary Discharge Instructions: Use Iodosorb if Iodoflex not available Secondary Dressing: ABD Pad, 5x9 (Generic) Every Other Day/30 Days Discharge Instructions: Apply over primary dressing as directed. Secondary Dressing: Woven Gauze Sponge, Non-Sterile 4x4 in (Generic) Every Other Day/30 Days Discharge Instructions: Apply over primary dressing as directed. Secured With: Elastic Bandage 4 inch (ACE bandage) (Generic) Every Other Day/30 Days Discharge Instructions: Secure with ACE bandage as directed. Secured With: [employer], 4.5x3.1 (in/yd) (Generic) Every Other Day/30 Days Discharge Instructions: Secure with Kerlix as directed. Secured With: 55M Medipore H Soft Cloth Surgical T ape, 4 x 10 (in/yd) (Generic) Every Other Day/30 Days Discharge Instructions: Secure with tape as directed. Wound #15 - Lower Leg Wound Laterality: Left, Anterior, Proximal Cleanser: Soap and Water Every Other  Day/30 Days Discharge Instructions: May shower and wash wound with dial antibacterial soap and water prior to dressing change. Peri-Wound Care: Sween Lotion (Moisturizing lotion) Every Other Day/30 Days Discharge Instructions: Apply moisturizing lotion as directed Prim Dressing: IODOFLEX 0.9% Cadexomer Iodine Pad 4x6 cm (Generic) Every Other Day/30 Days ary Discharge Instructions: Use Iodosorb if Iodoflex not available Secondary Dressing: ABD Pad, 5x9 (Generic) Every Other Day/30 Days Discharge Instructions: Apply over primary dressing as directed. Secondary Dressing: Woven Gauze Sponge, Non-Sterile 4x4 in (Generic) Every Other Day/30 Days Discharge Instructions: Apply over primary dressing as directed. Secured With: Elastic Bandage 4 inch (ACE bandage) (Generic) Every Other Day/30 Days Discharge Instructions: Secure with ACE bandage as directed. Secured With: [employer], 4.5x3.1 (in/yd) (Generic) Every Other Day/30 Days Discharge Instructions: Secure with Kerlix as directed. Secured With: 55M Medipore H Soft Cloth Surgical T ape, 4 x 10 (in/yd) (Generic) Every Other Day/30 Days Discharge Instructions: Secure with tape as directed. Wound #16 - Lower Leg Wound Laterality: Left, Medial, Distal Cleanser: Soap and Water Every Other Day/30 Days Discharge Instructions: May shower and wash wound with dial antibacterial soap and water prior to dressing change. Peri-Wound Care: Sween Lotion (Moisturizing lotion) Every Other Day/30 Days Discharge Instructions: Apply moisturizing lotion as directed Prim Dressing: IODOFLEX 0.9% Cadexomer Iodine Pad 4x6 cm (Generic) Every Other Day/30 Days ary Discharge Instructions: Use Iodosorb if Iodoflex not available Secondary Dressing: ABD Pad, 5x9 (Generic) Every Other Day/30 Days Discharge Instructions: Apply over primary dressing as directed. Secondary Dressing: Woven Gauze Sponge, Non-Sterile 4x4 in (Generic) Every Other Day/30 Days Discharge  Instructions: Apply over primary dressing as directed. Edward [name], Edward [name] ([id number]) [id number] Page 10 of 18 Secured With: Elastic Bandage 4 inch (ACE bandage) (Generic) Every Other Day/30 Days Discharge Instructions: Secure  with ACE bandage as directed. Secured With: [employer], 4.5x3.1 (in/yd) (Generic) Every Other Day/30 Days Discharge Instructions: Secure with Kerlix as directed. Secured With: 58M Medipore H Soft Cloth Surgical T ape, 4 x 10 (in/yd) (Generic) Every Other Day/30 Days Discharge Instructions: Secure with tape as directed. Electronic Signature(s) Signed: 06/27/2022 4:54:29 PM By: Fredirick Maudlin MD FACS Entered By: Fredirick Maudlin on 06/27/2022 16:50:02 -------------------------------------------------------------------------------- Problem List Details Patient Name: Date of Service: Edward [name], Edward MES L. 06/27/2022 3:15 PM Medical Record Number: [id number] Patient Account Number: [id number] Date of Birth/Sex: Treating RN: 1971/05/23 (51 y.o. 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 05/15/2022 No Yes involvement without evidence of necrosis N18.6 End stage renal disease 05/15/2022 No Yes Z89.611 Acquired absence of right leg above knee 05/15/2022 No Yes Z89.432 Acquired absence of left foot 05/15/2022 No Yes I50.42 Chronic combined systolic (congestive) and diastolic (congestive) heart failure 05/15/2022 No Yes I73.9 Peripheral vascular disease, unspecified 05/15/2022 No Yes E11.52 Type 2 diabetes mellitus with diabetic peripheral angiopathy with gangrene 05/15/2022 No Yes L97.522 Non-pressure chronic ulcer of other part of left foot with fat layer exposed 06/20/2022 No  Yes L97.325 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 06/27/2022 3:15 PM Medical Record Number: [id number] Patient Account Number: [id number] Date of Birth/Sex: Treating RN: 23-May-1971 (51 y.o. M) Primary Care Provider: [name] Other Clinician: Referring Provider: Treating Provider/Extender: Kevan Ny in Treatment: 6 Subjective Chief Complaint Information obtained from Patient Patients presents for treatment of an open diabetic ulcer History of Present Illness (HPI) Admission 09/24/2021 Mr. Evann Erazo is a 51 year old male with a past medical history of foreign years gangrene, type 2 diabetes, end-stage renal disease on hemodialysis, right BKA, left transmetatarsal amputation that presents to the clinic for 3 wounds. He developed cellulitis to the left fourth finger and was treated with doxycycline at urgent care. The nail is coming off. He reports taking the antibiotics currently. He has been keeping the area covered. He also presents with 2 left lower extremity wounds. He reports that the more proximal wound has been present for several months and the more distal wound has developed more recently over the past several weeks. He had an abdominal aortogram to the left lower extremity with laser atherectomy to the left popliteal, left tibioperoneal trunk and left peroneal arteries,  drug-coated balloon angioplasty of the left popliteal artery and plain balloon angioplasty of the left tibioperoneal trunk and left peroneal artery. He has not followed up with vein and vascular since his procedure on 04/09/2021. He currently denies signs of infection. He currently denies pain. 11/28/2021: The patient has not been seen since his initial admission. He has not followed up with vascular surgery, either. He did undergo amputation of his left fourth finger and is currently under the care of orthopedics and occupational therapy for this. He presents today  with multiple wounds on his bilateral lower extremities. He is very fluid overloaded, based upon the appearance of his extremities. These all seem to have started as blisters that have subsequently opened and caused ulcers. He does have a shrinker sock for his right BKA, but he is unable to wear it secondary to pain. He also has a compression stocking for his left lower extremity but he has been unable to apply it due to the amputation on his left hand. The largest and most painful of these wounds is just above the knee on the right. All of the wounds have eschar and some slough present. No overt signs of infection. 12/05/2021: All of the wounds look about the same. He has new blisters that have opened up revealing denuded skin underneath. The eschar is extremely hard and where there is not eschar, the wounds are gritty. He was unable to tolerate the Ace wrap at the tightness that we applied it last week and ended up having to have it loosened. He has been tolerating Kerlix and Coban on the left leg. He has not received a follow-up appointment with vascular surgery yet. We have been using Santyl under Hydrofera Blue. 12/12/2021: We have been unable to secure home health care assistance. The patient and his son are doing his wound care at home. He does have a follow-up visit in vascular surgery on May 24. He appears to be less fluid overloaded  today. The wound on his right medial thigh looks bigger today and has a thick layer of hard dark eschar. The wound on his lateral right stump looks somewhat improved with softer eschar and slough. There is also slough on the wound overlying the end of the stump and wrapping around to the backside of it. On his left leg, the edema control is improved. The wound on his anterior tibial surface is small and clean. On his dorsal and medial ankle, there is slough buildup. 12/19/2021: The wound on his left anterior tibial surface is closed. The wound on his dorsal left foot looks about the same with just a bit of slough buildup. The surface is gritty. The wound on his right medial thigh looks about the same today and has reaccumulated the hard dark eschar, despite vigorous debridement last week. The wound on the right lateral stump is about the same and has less eschar and slough. Overlying the end of the stump and wrapping around the backside of it, the wounds have converged. Overall, his edema control is much better than it has been and certainly than it was on the first visit. 12/26/2021: The wound on his dorsal left foot has a bit of slough accumulation but the surface is less gritty. The wound on his left medial calf is clean with a good bit of granulation tissue. The wounds on his right thigh and stump continue to display a heavy thick layer of black eschar. 01/01/2022: The wound on his dorsal left foot is fairly clean but still has a gritty surface. The wound on his left medial calf is nearly closed with just a little bit of overlying eschar. Unfortunately, the wounds on his right thigh and stump have deteriorated. 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 He has a fluctuant area near the proximal part of his medial thigh wound. There is an odor coming from that leg but I am not sure which of the wounds is producing it or if both of them  are. 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 He is now back for further evaluation and management of 3 small wounds on his left lower leg, just above the ankle on the anterior tibial surface. KARDER, GOODIN ([id number]) [id number] Page 12 of 18 They are very fibrotic and dry. Muscle fibers are visible in the most proximal wound. There is slough and leathery eschar accumulation present. 06/20/2022: Mr. Spieler returns to clinic after having been in the hospital for quite some time. He has returned with multiple new wounds. The largest of these is a nearly circumferential wound on the mid to lower portion of his left lower leg. He has wounds on his Achilles tendon area with muscle and tendon exposed. The anterior ankle wound has thick black eschar accumulated on the surface. The dorsal foot wound also has slough and eschar buildup. All of the wound surfaces are incredibly fibrotic and do not look particularly viable. 06/27/2022: All of the wounds are bigger this week, but most of them have an improved surface, with less fibrosis and even some pink coloration, particularly the circumferential leg wound. The anterior ankle wound has some tendon exposure underneath fibrinous slough in the proximal anterior leg wound remains fairly dry and tough. Patient History Information obtained from Patient, Chart. Family History Cancer - Mother, Diabetes - Siblings, Heart Disease - Mother,Father, Kidney Disease - Siblings,Father, No family history of Hereditary Spherocytosis, Hypertension, Lung Disease, Seizures, Stroke, Thyroid Problems, Tuberculosis. Social History Former smoker - Quit 2 years ago, Marital Status - Widowed, Alcohol Use - Rarely, Drug Use - No History, Caffeine Use - Moderate - coffee. Medical History Eyes Patient has  history of Cataracts - Surgery Denies history of Glaucoma, Optic Neuritis Hematologic/Lymphatic Patient has history of Anemia Respiratory Patient has history of Sleep Apnea Cardiovascular Patient has history of Arrhythmia - junctional rhythm, Congestive Heart Failure, Coronary Artery Disease, Hypertension, Myocardial Infarction, Peripheral Venous Disease Endocrine Patient has history of Type II Diabetes Denies history of Type I Diabetes Genitourinary Patient has history of End Stage Renal Disease - Dialysis Integumentary (Skin) Denies history of History of Burn Musculoskeletal Patient has history of Osteoarthritis, Osteomyelitis Denies history of Gout, Rheumatoid Arthritis Neurologic Patient has history of Neuropathy Oncologic Denies history of Received Chemotherapy, Received Radiation Psychiatric Patient has history of Confinement Anxiety Denies history of Anorexia/bulimia Hospitalization/Surgery History - amputation left index finger 11/15/21. - debridement and closure left ring finger. - peripheral vascular atherectomy 2/99/37. - left bascillic vein transposition 12/1 21. - revision of AV fistula left 05/24/20. - AV fistula placement left 11/25/19. - coronary stent intervention 12/11 19. - right BKA 06/19/18. - total left hip arthroplasty 10/11/16. - left transmet amp 05/10/16. - amputation above knee R 01/08/22. - amputauoin left index finger 01/12/22. - amputation right index finger. Medical A Surgical History Notes nd Cardiovascular cardiomyopathy Neurologic TIA Objective Constitutional No acute distress.. Vitals Time Taken: 3:45 AM, Temperature: 98.5 F, Pulse: 88 bpm, Respiratory Rate: 20 breaths/min, Blood Pressure: 111/67 mmHg, Capillary Blood Glucose: 131 mg/dl. Respiratory Normal work of breathing on room air.. General Notes: 06/27/2022: All of the wounds are bigger this week, but most of them have an improved surface, with less fibrosis and even some pink coloration,  particularly the circumferential leg wound. The anterior ankle wound has some tendon exposure underneath fibrinous slough in the proximal anterior leg wound remains fairly dry  and tough. Integumentary (Hair, Skin) Wound #11 status is Open. Original cause of wound was Gradually Appeared. The date acquired was: 12/17/2021. The wound has been in treatment 6 weeks. The AEDEN, MATRANGA ([id number]) [id number] Page 13 of 18 wound is located on the Left,Anterior Lower Leg. The wound measures 6.5cm length x 7cm width x 0.1cm depth; 35.736cm^2 area and 3.574cm^3 volume. There is muscle and Fat Layer (Subcutaneous Tissue) exposed. There is no tunneling or undermining noted. There is a medium amount of serosanguineous drainage noted. There is small (1-33%) pink granulation within the wound bed. There is a large (67-100%) amount of necrotic tissue within the wound bed including Eschar and Adherent Slough. The periwound skin appearance had no abnormalities noted for texture. The periwound skin appearance had no abnormalities noted for moisture. The periwound skin appearance had no abnormalities noted for color. Wound #12 status is Open. Original cause of wound was Gradually Appeared. The date acquired was: 06/15/2022. The wound has been in treatment 1 weeks. The wound is located on the Left Achilles. The wound measures 4.4cm length x 3cm width x 0.1cm depth; 10.367cm^2 area and 1.037cm^3 volume. There is Fat Layer (Subcutaneous Tissue) exposed. There is no tunneling or undermining noted. There is a medium amount of serosanguineous drainage noted. There is small (1-33%) pink granulation within the wound bed. There is a large (67-100%) amount of necrotic tissue within the wound bed including Eschar and Adherent Slough. The periwound skin appearance exhibited: Pallor. Periwound temperature was noted as No Abnormality. Wound #13 status is Open. Original cause of wound was Gradually Appeared.  The date acquired was: 06/18/2022. The wound has been in treatment 1 weeks. The wound is located on the Left,Circumferential Lower Leg. The wound measures 20.5cm length x 20cm width x 0.1cm depth; 322.013cm^2 area and 32.201cm^3 volume. There is Fat Layer (Subcutaneous Tissue) exposed. There is no tunneling or undermining noted. There is a medium amount of serosanguineous drainage noted. There is medium (34-66%) pink granulation within the wound bed. There is a medium (34-66%) amount of necrotic tissue within the wound bed including Eschar and Adherent Slough. Wound #14 status is Open. Original cause of wound was Gradually Appeared. The date acquired was: 06/18/2022. The wound has been in treatment 1 weeks. The wound is located on the Left,Dorsal Foot. The wound measures 1.3cm length x 1.2cm width x 0.2cm depth; 1.225cm^2 area and 0.245cm^3 volume. There is Fat Layer (Subcutaneous Tissue) exposed. There is no tunneling or undermining noted. There is a medium amount of serosanguineous drainage noted. There is small (1-33%) pink granulation within the wound bed. There is a large (67-100%) amount of necrotic tissue within the wound bed including Eschar and Adherent Slough. The periwound skin appearance exhibited: Scarring, Maceration, Hemosiderin Staining. Periwound temperature was noted as No Abnormality. Wound #15 status is Open. Original cause of wound was Gradually Appeared. The date acquired was: 06/18/2022. The wound has been in treatment 1 weeks. The wound is located on the Left,Proximal,Anterior Lower Leg. The wound measures 1.8cm length x 2cm width x 0.1cm depth; 2.827cm^2 area and 0.283cm^3 volume. There is Fat Layer (Subcutaneous Tissue) exposed. There is no tunneling or undermining noted. There is a medium amount of serosanguineous drainage noted. There is large (67-100%) pink granulation within the wound bed. There is a small (1-33%) amount of necrotic tissue within the wound bed including  Eschar and Adherent Slough. The periwound skin appearance had no abnormalities noted for moisture. The periwound skin appearance had no abnormalities noted for color.  The periwound skin appearance exhibited: Scarring. Periwound temperature was noted as No Abnormality. Wound #16 status is Open. Original cause of wound was Gradually Appeared. The date acquired was: 06/18/2022. The wound has been in treatment 1 weeks. The wound is located on the Left,Distal,Medial Lower Leg. The wound measures 2.8cm length x 0.6cm width x 0.1cm depth; 1.319cm^2 area and 0.132cm^3 volume. There is Fat Layer (Subcutaneous Tissue) exposed. There is no tunneling or undermining noted. There is a medium amount of serosanguineous drainage noted. There is small (1-33%) pink granulation within the wound bed. There is a large (67-100%) amount of necrotic tissue within the wound bed including Eschar and Adherent Slough. The periwound skin appearance had no abnormalities noted for texture. The periwound skin appearance had no abnormalities noted for moisture. The periwound skin appearance had no abnormalities noted for color. Periwound temperature was noted as No Abnormality. 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 There was a Excisional Skin/Subcutaneous Tissue Debridement with a total area of 45.5 sq cm performed by Fredirick Maudlin, MD. With the following instrument(s): Curette to remove Non-Viable tissue/material. Material removed includes Subcutaneous Tissue and Slough and after achieving pain control using Lidocaine 5% topical ointment. No specimens were taken. A time out was conducted at 16:20, prior to the start of the procedure. A Minimum amount of bleeding was controlled with Pressure. The procedure was tolerated well with a pain level of 0 throughout and a pain level of 0 following the procedure. Post Debridement Measurements: 6.5cm length x 7cm width x 0.1cm depth; 3.574cm^3 volume. Character of Wound/Ulcer Post Debridement is improved. Severity of Tissue Post Debridement is: Fat layer exposed. Post procedure Diagnosis Wound #11: Same as Pre-Procedure General Notes: Scribed for Dr. Celine Edward [name] by J.Scotton. Wound #12 Pre-procedure diagnosis of Wound #12 is a Diabetic Wound/Ulcer of the Lower Extremity located on the Left Achilles .Severity of Tissue Pre Debridement is: Fat layer exposed. There was a Selective/Open Wound Non-Viable Tissue Debridement with a total area of 13.2 sq cm performed by Fredirick Maudlin, MD. With the following instrument(s): Curette to remove Non-Viable tissue/material. Material removed includes Eschar and Slough and after achieving pain control using Lidocaine 5% topical ointment. No specimens were taken. A time out was conducted at 16:20, prior to the start of the procedure. A Minimum amount of bleeding was controlled with Pressure. The procedure was tolerated well with a pain level of 0 throughout and a pain level of 0 following the procedure. Post Debridement Measurements: 4.4cm length x 3cm width x 0.2cm depth; 2.073cm^3 volume. Character of  Wound/Ulcer Post Debridement requires further debridement. Severity of Tissue Post Debridement is: Fat layer exposed. Post procedure Diagnosis Wound #12: Same as Pre-Procedure General Notes: Scribed for Dr. Celine Edward [name] by J.Scotton. Wound #13 Pre-procedure diagnosis of Wound #13 is  a Diabetic Wound/Ulcer of the Lower Extremity located on the Left,Circumferential Lower Leg .Severity of Tissue Pre Debridement is: Fat layer exposed. There was a Selective/Open Wound Non-Viable Tissue Debridement with a total area of 300 sq cm performed by Fredirick Maudlin, MD. With the following instrument(s): Curette to remove Non-Viable tissue/material. Material removed includes [facility] after achieving pain control ROSEVELT, LUU ([id number]) [id number] Page 14 of 18 using Lidocaine 5% topical ointment. No specimens were taken. A time out was conducted at 16:20, prior to the start of the procedure. A Minimum amount of bleeding was controlled with Pressure. The procedure was tolerated well with a pain level of 0 throughout and a pain level of 0 following the procedure. Post Debridement Measurements: 20.5cm length x 20cm width x 0.1cm depth; 32.201cm^3 volume. Character of Wound/Ulcer Post Debridement is improved. Severity of Tissue Post Debridement is: Fat layer exposed. Post procedure Diagnosis Wound #13: Same as Pre-Procedure General Notes: Scribed for Dr. Celine Edward [name] by J.Scotton. Wound #14 Pre-procedure diagnosis of Wound #14 is a Diabetic Wound/Ulcer of the Lower Extremity located on the Left,Dorsal Foot .Severity of Tissue Pre Debridement is: Fat layer exposed. There was a Excisional Skin/Subcutaneous Tissue Debridement with a total area of 1.56 sq cm performed by Fredirick Maudlin, MD. With the following instrument(s): Curette to remove Non-Viable tissue/material. Material removed includes Subcutaneous Tissue and Slough and after achieving pain control using Lidocaine 5% topical ointment. No  specimens were taken. A time out was conducted at 16:20, prior to the start of the procedure. A Minimum amount of bleeding was controlled with Pressure. The procedure was tolerated well with a pain level of 0 throughout and a pain level of 0 following the procedure. Post Debridement Measurements: 1.3cm length x 1.2cm width x 0.2cm depth; 0.245cm^3 volume. Character of Wound/Ulcer Post Debridement is improved. Severity of Tissue Post Debridement is: Fat layer exposed. Post procedure Diagnosis Wound #14: Same as Pre-Procedure General Notes: Scribed for Dr. Celine Edward [name] by J.Scotton. Wound #15 Pre-procedure diagnosis of Wound #15 is a Diabetic Wound/Ulcer of the Lower Extremity located on the Left,Proximal,Anterior Lower Leg .Severity of Tissue Pre Debridement is: Fat layer exposed. There was a Selective/Open Wound Non-Viable Tissue Debridement with a total area of 3.6 sq cm performed by Fredirick Maudlin, MD. With the following instrument(s): Curette to remove Non-Viable tissue/material. Material removed includes [facility] after achieving pain control using Lidocaine 5% topical ointment. No specimens were taken. A time out was conducted at 16:20, prior to the start of the procedure. A Minimum amount of bleeding was controlled with Pressure. The procedure was tolerated well with a pain level of 0 throughout and a pain level of 0 following the procedure. Post Debridement Measurements: 1.8cm length x 2cm width x 0.1cm depth; 0.283cm^3 volume. Character of Wound/Ulcer Post Debridement is improved. Severity of Tissue Post Debridement is: Fat layer exposed. Post procedure Diagnosis Wound #15: Same as Pre-Procedure General Notes: Scribed foir Dr. Celine Edward [name] by J.Scotton. Wound #16 Pre-procedure diagnosis of Wound #16 is a Diabetic Wound/Ulcer of the Lower Extremity located on the Left,Distal,Medial Lower Leg .Severity of Tissue Pre Debridement is: Fat layer exposed. There was a Excisional Skin/Subcutaneous Tissue  Debridement with a total area of 1.2 sq cm performed by Fredirick Maudlin, MD. With the following instrument(s): Curette to remove Non-Viable tissue/material. Material removed includes Subcutaneous Tissue and Slough and after achieving pain control using Lidocaine 5% topical ointment. No specimens were taken. A time out was conducted at 16:20, prior to the start of the procedure. A Minimum amount  of bleeding was controlled with Pressure. The procedure was tolerated well with a pain level of 0 throughout and a pain level of 0 following the procedure. Post Debridement Measurements: 2.8cm length x 0.6cm width x 0.1cm depth; 0.132cm^3 volume. Character of Wound/Ulcer Post Debridement is improved. Severity of Tissue Post Debridement is: Fat layer exposed. Post procedure Diagnosis Wound #16: Same as Pre-Procedure General Notes: Scribed for Dr. Celine Edward [name] by J.Scotton. Plan Follow-up Appointments: Return Appointment in 1 week. - Dr. Celine Edward [name] Rm 3 Anesthetic: Wound #11 Left,Anterior Lower Leg: (In clinic) Topical Lidocaine 5% applied to wound bed Bathing/ Shower/ Hygiene: May shower and wash wound with soap and water. - use antibacterial soap to wash legs and wounds when changing dressing. right leg Edema Control - Lymphedema / SCD / Other: Elevate legs to the level of the heart or above for 30 minutes daily and/or when sitting, a frequency of: - throughout the day Avoid standing for long periods of time. Additional Orders / Instructions: Follow Nutritious Diet - Monitor/Control Blood Sugars WOUND #11: - Lower Leg Wound Laterality: Left, Anterior Cleanser: Soap and Water Every Other Day/30 Days Discharge Instructions: May shower and wash wound with dial antibacterial soap and water prior to dressing change. Peri-Wound Care: Sween Lotion (Moisturizing lotion) Every Other Day/30 Days Discharge Instructions: Apply moisturizing lotion as directed Prim Dressing: IODOFLEX 0.9% Cadexomer Iodine Pad 4x6 cm  (Generic) Every Other Day/30 Days ary Discharge Instructions: Use Iodosorb if Iodoflex not available Secondary Dressing: ABD Pad, 5x9 (Generic) Every Other Day/30 Days Discharge Instructions: Apply over primary dressing as directed. Secondary Dressing: Woven Gauze Sponge, Non-Sterile 4x4 in (Generic) Every Other Day/30 Days Discharge Instructions: Apply over primary dressing as directed. Secured With: Elastic Bandage 4 inch (ACE bandage) (Generic) Every Other Day/30 Days Discharge Instructions: Secure with ACE bandage as directed. Secured With: [employer], 4.5x3.1 (in/yd) (Generic) Every Other Day/30 Days Discharge Instructions: Secure with Kerlix as directed. Secured With: 60M Medipore H Soft Cloth Surgical T ape, 4 x 10 (in/yd) (Generic) Every Other Day/30 Days Discharge Instructions: Secure with tape as directed. WOUND #12: - Achilles Wound Laterality: Left Cleanser: Soap and Water Every Other Day/30 Days Discharge Instructions: May shower and wash wound with dial antibacterial soap and water prior to dressing change. Peri-Wound Care: Sween Lotion (Moisturizing lotion) Every Other Day/30 Days Discharge Instructions: Apply moisturizing lotion as directed Prim Dressing: IODOFLEX 0.9% Cadexomer Iodine Pad 4x6 cm (Generic) Every Other Day/30 Days ary Discharge Instructions: Use Iodosorb if Iodoflex not available Secondary Dressing: ABD Pad, 5x9 (Generic) Every Other Day/30 Days Discharge Instructions: Apply over primary dressing as directed. Secondary Dressing: Woven Gauze Sponge, Non-Sterile 4x4 in (Generic) Every Other Day/30 Days Discharge Instructions: Apply over primary dressing as directed. Secured With: Elastic Bandage 4 inch (ACE bandage) (Generic) Every Other Day/30 Days Discharge Instructions: Secure with ACE bandage as directed. Secured With: [employer], 4.5x3.1 (in/yd) (Generic) Every Other Day/30 Days Discharge Instructions: Secure with Kerlix as  directed. Secured With: 60M Medipore H Soft Cloth Surgical T ape, 4 x 10 (in/yd) (Generic) Every Other Day/30 Days OLUWATIMILEYIN, VIVIER ([id number]) [id number] Page 15 of 18 Discharge Instructions: Secure with tape as directed. WOUND #13: - Lower Leg Wound Laterality: Left, Circumferential Cleanser: Soap and Water Every Other Day/30 Days Discharge Instructions: May shower and wash wound with dial antibacterial soap and water prior to dressing change. Peri-Wound Care: Sween Lotion (Moisturizing lotion) Every Other Day/30 Days Discharge Instructions: Apply moisturizing lotion as directed Prim Dressing: IODOFLEX 0.9% Cadexomer Iodine  Pad 4x6 cm (Generic) Every Other Day/30 Days ary Discharge Instructions: Use Iodosorb if Iodoflex not available Secondary Dressing: ABD Pad, 5x9 (Generic) Every Other Day/30 Days Discharge Instructions: Apply over primary dressing as directed. Secondary Dressing: Woven Gauze Sponge, Non-Sterile 4x4 in (Generic) Every Other Day/30 Days Discharge Instructions: Apply over primary dressing as directed. Secured With: Elastic Bandage 4 inch (ACE bandage) (Generic) Every Other Day/30 Days Discharge Instructions: Secure with ACE bandage as directed. Secured With: [employer], 4.5x3.1 (in/yd) (Generic) Every Other Day/30 Days Discharge Instructions: Secure with Kerlix as directed. Secured With: 130M Medipore H Soft Cloth Surgical T ape, 4 x 10 (in/yd) (Generic) Every Other Day/30 Days Discharge Instructions: Secure with tape as directed. WOUND #14: - Foot Wound Laterality: Dorsal, Left Cleanser: Soap and Water Every Other Day/30 Days Discharge Instructions: May shower and wash wound with dial antibacterial soap and water prior to dressing change. Peri-Wound Care: Sween Lotion (Moisturizing lotion) Every Other Day/30 Days Discharge Instructions: Apply moisturizing lotion as directed Prim Dressing: IODOFLEX 0.9% Cadexomer Iodine Pad 4x6 cm  (Generic) Every Other Day/30 Days ary Discharge Instructions: Use Iodosorb if Iodoflex not available Secondary Dressing: ABD Pad, 5x9 (Generic) Every Other Day/30 Days Discharge Instructions: Apply over primary dressing as directed. Secondary Dressing: Woven Gauze Sponge, Non-Sterile 4x4 in (Generic) Every Other Day/30 Days Discharge Instructions: Apply over primary dressing as directed. Secured With: Elastic Bandage 4 inch (ACE bandage) (Generic) Every Other Day/30 Days Discharge Instructions: Secure with ACE bandage as directed. Secured With: [employer], 4.5x3.1 (in/yd) (Generic) Every Other Day/30 Days Discharge Instructions: Secure with Kerlix as directed. Secured With: 130M Medipore H Soft Cloth Surgical T ape, 4 x 10 (in/yd) (Generic) Every Other Day/30 Days Discharge Instructions: Secure with tape as directed. WOUND #15: - Lower Leg Wound Laterality: Left, Anterior, Proximal Cleanser: Soap and Water Every Other Day/30 Days Discharge Instructions: May shower and wash wound with dial antibacterial soap and water prior to dressing change. Peri-Wound Care: Sween Lotion (Moisturizing lotion) Every Other Day/30 Days Discharge Instructions: Apply moisturizing lotion as directed Prim Dressing: IODOFLEX 0.9% Cadexomer Iodine Pad 4x6 cm (Generic) Every Other Day/30 Days ary Discharge Instructions: Use Iodosorb if Iodoflex not available Secondary Dressing: ABD Pad, 5x9 (Generic) Every Other Day/30 Days Discharge Instructions: Apply over primary dressing as directed. Secondary Dressing: Woven Gauze Sponge, Non-Sterile 4x4 in (Generic) Every Other Day/30 Days Discharge Instructions: Apply over primary dressing as directed. Secured With: Elastic Bandage 4 inch (ACE bandage) (Generic) Every Other Day/30 Days Discharge Instructions: Secure with ACE bandage as directed. Secured With: [employer], 4.5x3.1 (in/yd) (Generic) Every Other Day/30 Days Discharge Instructions: Secure with  Kerlix as directed. Secured With: 130M Medipore H Soft Cloth Surgical T ape, 4 x 10 (in/yd) (Generic) Every Other Day/30 Days Discharge Instructions: Secure with tape as directed. WOUND #16: - Lower Leg Wound Laterality: Left, Medial, Distal Cleanser: Soap and Water Every Other Day/30 Days Discharge Instructions: May shower and wash wound with dial antibacterial soap and water prior to dressing change. Peri-Wound Care: Sween Lotion (Moisturizing lotion) Every Other Day/30 Days Discharge Instructions: Apply moisturizing lotion as directed Prim Dressing: IODOFLEX 0.9% Cadexomer Iodine Pad 4x6 cm (Generic) Every Other Day/30 Days ary Discharge Instructions: Use Iodosorb if Iodoflex not available Secondary Dressing: ABD Pad, 5x9 (Generic) Every Other Day/30 Days Discharge Instructions: Apply over primary dressing as directed. Secondary Dressing: Woven Gauze Sponge, Non-Sterile 4x4 in (Generic) Every Other Day/30 Days Discharge Instructions: Apply over primary dressing as directed. Secured With: Secondary school teacher  4 inch (ACE bandage) (Generic) Every Other Day/30 Days Discharge Instructions: Secure with ACE bandage as directed. Secured With: [employer], 4.5x3.1 (in/yd) (Generic) Every Other Day/30 Days Discharge Instructions: Secure with Kerlix as directed. Secured With: 58M Medipore H Soft Cloth Surgical T ape, 4 x 10 (in/yd) (Generic) Every Other Day/30 Days Discharge Instructions: Secure with tape as directed. 06/27/2022: All of the wounds are bigger this week, but most of them have an improved surface, with less fibrosis and even some pink coloration, particularly the circumferential leg wound. The anterior ankle wound has some tendon exposure underneath fibrinous slough in the proximal anterior leg wound remains fairly dry and tough. I used a curette to debride slough from the circumferential leg wound, slough and nonviable subcutaneous tissue from the anterior ankle wound and  Achilles wound. Slough and eschar was debrided from the small medial foot wounds. I also debrided slough from the proximal anterior leg wound. The response to Iodoflex has been good so we will continue this. Electronic Signature(s) Signed: 06/27/2022 4:53:32 PM By: Fredirick Maudlin MD FACS Entered By: Fredirick Maudlin on 06/27/2022 16:53:32 Edward [name] ([id number]) [id number] Page 16 of 18 -------------------------------------------------------------------------------- HxROS Details Patient Name: Date of Service: Edward [name] MES L. 06/27/2022 3:15 PM Medical Record Number: [id number] Patient Account Number: [id number] Date of Birth/Sex: Treating RN: 1971/01/02 (51 y.o. M) Primary Care Provider: [name] Other Clinician: Referring Provider: Treating Provider/Extender: Kevan Ny in Treatment: 6 Information Obtained From Patient Chart Eyes Medical History: Positive for: Cataracts - Surgery Negative for: Glaucoma; Optic Neuritis Hematologic/Lymphatic Medical History: Positive for: Anemia Respiratory Medical History: Positive for: Sleep Apnea Cardiovascular Medical History: Positive for: Arrhythmia - junctional rhythm; Congestive Heart Failure; Coronary Artery Disease; Hypertension; Myocardial Infarction; Peripheral Venous Disease Past Medical History Notes: cardiomyopathy Endocrine Medical History: Positive for: Type II Diabetes Negative for: Type I Diabetes Time with diabetes: 2004 Treated with: Insulin Blood sugar tested every day: Yes Tested : daily Genitourinary Medical History: Positive for: End Stage Renal Disease - Dialysis Integumentary (Skin) Medical History: Negative for: History of Burn Musculoskeletal Medical History: Positive for: Osteoarthritis; Osteomyelitis Negative for: Gout; Rheumatoid Arthritis Neurologic Medical History: Positive for: Neuropathy Past Medical History  Notes: TIA Oncologic Medical History: Negative for: Received Chemotherapy; Received Radiation WRIGLEY, PLASENCIA ([id number]) [id number] Page 17 of 18 Psychiatric Medical History: Positive for: Confinement Anxiety Negative for: Anorexia/bulimia HBO Extended History Items Eyes: Cataracts Immunizations Pneumococcal Vaccine: Received Pneumococcal Vaccination: Yes Received Pneumococcal Vaccination On or After 60th Birthday: No Implantable Devices Yes Hospitalization / Surgery History Type of Hospitalization/Surgery amputation left index finger 11/15/21 debridement and closure left ring finger peripheral vascular atherectomy 11/01/15 left bascillic vein transposition 12/1 21 revision of AV fistula left 05/24/20 AV fistula placement left 11/25/19 coronary stent intervention 12/11 19 right BKA 06/19/18 total left hip arthroplasty 10/11/16 left transmet amp 05/10/16 amputation above knee R 01/08/22 amputauoin left index finger 01/12/22 amputation right index finger Family and Social History Cancer: Yes - Mother; Diabetes: Yes - Siblings; Heart Disease: Yes - Mother,Father; Hereditary Spherocytosis: No; Hypertension: No; Kidney Disease: Yes - Siblings,Father; Lung Disease: No; Seizures: No; Stroke: No; Thyroid Problems: No; Tuberculosis: No; Former smoker - Quit 2 years ago; Marital Status - Widowed; Alcohol Use: Rarely; Drug Use: No History; Caffeine Use: Moderate - coffee; Financial Concerns: No; Food, Clothing or Shelter Needs: No; Support System Lacking: No; Transportation Concerns: No Electronic Signature(s) Signed: 06/27/2022 4:54:29 PM By: Fredirick Maudlin MD FACS Entered By: Fredirick Maudlin  on 06/27/2022 16:47:11 -------------------------------------------------------------------------------- SuperBill Details Patient Name: Date of Service: Edward [name] MES L. 06/27/2022 Medical Record Number: [id number] Patient Account Number: [id number] Date of Birth/Sex:  Treating RN: [birth year] (51 y.o. 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 evidence of necrosis N18.6 End stage renal disease Z89.611 Acquired absence of right leg above knee Z89.432 Acquired absence of left foot I50.42 Chronic combined systolic (congestive) and diastolic (congestive) heart failure I73.9 Peripheral vascular disease, unspecified FINNLEE, SILVERNAIL ([id number]) [id number] Page 18 of 18 E11.52 Type 2 diabetes mellitus with diabetic peripheral angiopathy with gangrene L97.522 Non-pressure chronic ulcer of other part of left foot with fat layer exposed L97.325 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 L97.822 Non-pressure chronic ulcer of other part of left lower leg with fat layer exposed Facility Procedures : CPT4 Code: 29476546 Description: 50354 - DEB SUBQ TISSUE 20 SQ CM/< ICD-10 Diagnosis Description L97.325 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 of Modifier: necrosis Quantity: 1 : CPT4 Code: 65681275 Description: 17001 - DEB SUBQ TISS EA ADDL 20CM ICD-10 Diagnosis Description L97.522 Non-pressure chronic ulcer of other part of left foot with fat layer exposed Z89.611 Acquired absence of right leg above knee Modifier: Quantity: 2 : CPT4 Code: [id number] Description: 59163 - DEBRIDE WOUND 1ST 20 SQ CM OR < ICD-10 Diagnosis Description L97.822 Non-pressure chronic ulcer of other part of left lower leg with fat layer exposed Modifier: Quantity: 1 : CPT4 Code: [id number] Description: 57017 - DEBRIDE WOUND EA ADDL 20 SQ CM ICD-10 Diagnosis Description L97.822 Non-pressure  chronic ulcer of other part of left lower leg with fat layer exposed Modifier: Quantity: 15 Physician Procedures : CPT4 Code Description Modifier [id number] - WC PHYS LEVEL 3 - EST PT 25 ICD-10 Diagnosis Description L97.825 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 without evidence o L97.522 Non-pressure chronic ulcer of  other part of left foot with fat layer exposed L97.325 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 L97.822 Non-pressure chronic ulcer of other part of left lower leg with fat layer exposed Quantity: 1 f necrosis : [id number] 11042 - WC PHYS SUBQ TISS 20 SQ CM ICD-10 Diagnosis Description L97.325 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 Quantity: 1 : [id number] 11045 - WC PHYS SUBQ TISS EA ADDL 20 CM ICD-10 Diagnosis Description L97.522 Non-pressure chronic ulcer of other part of left foot with fat layer exposed Z89.611 Acquired absence of right leg above knee Quantity: 2 : [id number] 97597 - WC PHYS DEBR WO ANESTH 20 SQ CM ICD-10 Diagnosis Description L97.822 Non-pressure chronic ulcer of other part of left lower leg with fat layer exposed Quantity: 1 : [id number] 42876 - WC PHYS DEBR WO ANESTH EA ADD 20 CM ICD-10 Diagnosis Description L97.822 Non-pressure chronic ulcer of other part of left lower leg with fat layer exposed Quantity: 15 Electronic Signature(s) Signed: 06/27/2022 4:54:15 PM By: Fredirick Maudlin MD FACS Entered By: Fredirick Maudlin on 06/27/2022 16:54:14

## 2022-06-29 DIAGNOSIS — Z992 Dependence on renal dialysis: Secondary | ICD-10-CM | POA: Diagnosis not present

## 2022-06-29 DIAGNOSIS — N2581 Secondary hyperparathyroidism of renal origin: Secondary | ICD-10-CM | POA: Diagnosis not present

## 2022-06-29 DIAGNOSIS — D631 Anemia in chronic kidney disease: Secondary | ICD-10-CM | POA: Diagnosis not present

## 2022-06-29 DIAGNOSIS — R52 Pain, unspecified: Secondary | ICD-10-CM | POA: Diagnosis not present

## 2022-06-29 DIAGNOSIS — L299 Pruritus, unspecified: Secondary | ICD-10-CM | POA: Diagnosis not present

## 2022-06-29 DIAGNOSIS — E1129 Type 2 diabetes mellitus with other diabetic kidney complication: Secondary | ICD-10-CM | POA: Diagnosis not present

## 2022-06-29 DIAGNOSIS — N186 End stage renal disease: Secondary | ICD-10-CM | POA: Diagnosis not present

## 2022-06-29 DIAGNOSIS — T8249XD Other complication of vascular dialysis catheter, subsequent encounter: Secondary | ICD-10-CM | POA: Diagnosis not present

## 2022-06-29 DIAGNOSIS — D689 Coagulation defect, unspecified: Secondary | ICD-10-CM | POA: Diagnosis not present

## 2022-07-01 DIAGNOSIS — I96 Gangrene, not elsewhere classified: Secondary | ICD-10-CM | POA: Diagnosis not present

## 2022-07-01 DIAGNOSIS — M25641 Stiffness of right hand, not elsewhere classified: Secondary | ICD-10-CM | POA: Diagnosis not present

## 2022-07-01 DIAGNOSIS — S68119D Complete traumatic metacarpophalangeal amputation of unspecified finger, subsequent encounter: Secondary | ICD-10-CM | POA: Diagnosis not present

## 2022-07-01 DIAGNOSIS — I998 Other disorder of circulatory system: Secondary | ICD-10-CM | POA: Diagnosis not present

## 2022-07-01 DIAGNOSIS — M79644 Pain in right finger(s): Secondary | ICD-10-CM | POA: Diagnosis not present

## 2022-07-01 DIAGNOSIS — M79645 Pain in left finger(s): Secondary | ICD-10-CM | POA: Diagnosis not present

## 2022-07-01 DIAGNOSIS — T148XXA Other injury of unspecified body region, initial encounter: Secondary | ICD-10-CM | POA: Diagnosis not present

## 2022-07-01 DIAGNOSIS — L02519 Cutaneous abscess of unspecified hand: Secondary | ICD-10-CM | POA: Diagnosis not present

## 2022-07-02 DIAGNOSIS — N2581 Secondary hyperparathyroidism of renal origin: Secondary | ICD-10-CM | POA: Diagnosis not present

## 2022-07-02 DIAGNOSIS — Z992 Dependence on renal dialysis: Secondary | ICD-10-CM | POA: Diagnosis not present

## 2022-07-02 DIAGNOSIS — L299 Pruritus, unspecified: Secondary | ICD-10-CM | POA: Diagnosis not present

## 2022-07-02 DIAGNOSIS — R52 Pain, unspecified: Secondary | ICD-10-CM | POA: Diagnosis not present

## 2022-07-02 DIAGNOSIS — D689 Coagulation defect, unspecified: Secondary | ICD-10-CM | POA: Diagnosis not present

## 2022-07-02 DIAGNOSIS — E1129 Type 2 diabetes mellitus with other diabetic kidney complication: Secondary | ICD-10-CM | POA: Diagnosis not present

## 2022-07-02 DIAGNOSIS — D631 Anemia in chronic kidney disease: Secondary | ICD-10-CM | POA: Diagnosis not present

## 2022-07-02 DIAGNOSIS — T8249XD Other complication of vascular dialysis catheter, subsequent encounter: Secondary | ICD-10-CM | POA: Diagnosis not present

## 2022-07-02 DIAGNOSIS — N186 End stage renal disease: Secondary | ICD-10-CM | POA: Diagnosis not present

## 2022-07-04 ENCOUNTER — Telehealth: Payer: Self-pay

## 2022-07-04 DIAGNOSIS — E1129 Type 2 diabetes mellitus with other diabetic kidney complication: Secondary | ICD-10-CM | POA: Diagnosis not present

## 2022-07-04 DIAGNOSIS — R52 Pain, unspecified: Secondary | ICD-10-CM | POA: Diagnosis not present

## 2022-07-04 DIAGNOSIS — D689 Coagulation defect, unspecified: Secondary | ICD-10-CM | POA: Diagnosis not present

## 2022-07-04 DIAGNOSIS — D631 Anemia in chronic kidney disease: Secondary | ICD-10-CM | POA: Diagnosis not present

## 2022-07-04 DIAGNOSIS — N186 End stage renal disease: Secondary | ICD-10-CM | POA: Diagnosis not present

## 2022-07-04 DIAGNOSIS — T8249XD Other complication of vascular dialysis catheter, subsequent encounter: Secondary | ICD-10-CM | POA: Diagnosis not present

## 2022-07-04 DIAGNOSIS — Z992 Dependence on renal dialysis: Secondary | ICD-10-CM | POA: Diagnosis not present

## 2022-07-04 DIAGNOSIS — N2581 Secondary hyperparathyroidism of renal origin: Secondary | ICD-10-CM | POA: Diagnosis not present

## 2022-07-04 DIAGNOSIS — L299 Pruritus, unspecified: Secondary | ICD-10-CM | POA: Diagnosis not present

## 2022-07-04 NOTE — Telephone Encounter (Signed)
Pt called stating that his L AVF has clotted badly and his doctor advised him to call this office.  Reviewed pt's chart, returned call for clarification, two identifiers used. Instructed pt to call nephrologist so they will send this office the paperwork documenting the need for an intervention on his AVF. Pt has TDC to continue getting HD treatment. Confirmed understanding.

## 2022-07-05 ENCOUNTER — Encounter (HOSPITAL_BASED_OUTPATIENT_CLINIC_OR_DEPARTMENT_OTHER): Payer: Medicare Other | Admitting: General Surgery

## 2022-07-05 DIAGNOSIS — L97322 Non-pressure chronic ulcer of left ankle with fat layer exposed: Secondary | ICD-10-CM | POA: Diagnosis not present

## 2022-07-05 DIAGNOSIS — E1152 Type 2 diabetes mellitus with diabetic peripheral angiopathy with gangrene: Secondary | ICD-10-CM | POA: Diagnosis not present

## 2022-07-05 DIAGNOSIS — Z89611 Acquired absence of right leg above knee: Secondary | ICD-10-CM | POA: Diagnosis not present

## 2022-07-05 DIAGNOSIS — Z992 Dependence on renal dialysis: Secondary | ICD-10-CM | POA: Diagnosis not present

## 2022-07-05 DIAGNOSIS — Z89512 Acquired absence of left leg below knee: Secondary | ICD-10-CM | POA: Diagnosis not present

## 2022-07-05 DIAGNOSIS — L97325 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: Secondary | ICD-10-CM | POA: Diagnosis not present

## 2022-07-05 DIAGNOSIS — I132 Hypertensive heart and chronic kidney disease with heart failure and with stage 5 chronic kidney disease, or end stage renal disease: Secondary | ICD-10-CM | POA: Diagnosis not present

## 2022-07-05 DIAGNOSIS — E11621 Type 2 diabetes mellitus with foot ulcer: Secondary | ICD-10-CM | POA: Diagnosis not present

## 2022-07-05 DIAGNOSIS — L97825 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 without evidence of necrosis: Secondary | ICD-10-CM | POA: Diagnosis not present

## 2022-07-05 DIAGNOSIS — L97822 Non-pressure chronic ulcer of other part of left lower leg with fat layer exposed: Secondary | ICD-10-CM | POA: Diagnosis not present

## 2022-07-05 DIAGNOSIS — I5042 Chronic combined systolic (congestive) and diastolic (congestive) heart failure: Secondary | ICD-10-CM | POA: Diagnosis not present

## 2022-07-05 DIAGNOSIS — L97522 Non-pressure chronic ulcer of other part of left foot with fat layer exposed: Secondary | ICD-10-CM | POA: Diagnosis not present

## 2022-07-05 DIAGNOSIS — T148XXA Other injury of unspecified body region, initial encounter: Secondary | ICD-10-CM | POA: Diagnosis not present

## 2022-07-05 DIAGNOSIS — B965 Pseudomonas (aeruginosa) (mallei) (pseudomallei) as the cause of diseases classified elsewhere: Secondary | ICD-10-CM | POA: Diagnosis not present

## 2022-07-05 DIAGNOSIS — N186 End stage renal disease: Secondary | ICD-10-CM | POA: Diagnosis not present

## 2022-07-05 DIAGNOSIS — Z89432 Acquired absence of left foot: Secondary | ICD-10-CM | POA: Diagnosis not present

## 2022-07-05 DIAGNOSIS — E1122 Type 2 diabetes mellitus with diabetic chronic kidney disease: Secondary | ICD-10-CM | POA: Diagnosis not present

## 2022-07-05 DIAGNOSIS — E11622 Type 2 diabetes mellitus with other skin ulcer: Secondary | ICD-10-CM | POA: Diagnosis not present

## 2022-07-05 NOTE — Progress Notes (Deleted)
HPI: Follow-up coronary artery disease.  Patient admitted December 2019 with CHF and non-ST elevation myocardial infarction.  Cardiac catheterization revealed an 85% circumflex, 90% mid to distal LAD (distal LAD small vessel and not amenable to PCI) and 75% first marginal.  Patient had PCI of his left circumflex with drug-eluting stent. Stress echocardiogram February 2022 normal.  Carotid Dopplers February 2022 near normal bilaterally.  Patient had atherectomy of left popliteal, left tibioperoneal and left peroneal arteries as well as drug coated balloon angioplasty of left popliteal artery and plain balloon angioplasty of left tibioperoneal trunk and left peroneal artery August 2022.  Echocardiogram at [facility] November 2022 showed ejection fraction 40 to 45%, mild RV dysfunction, mild aortic insufficiency, severe mitral regurgitation, severe tricuspid regurgitation. Monitor May 2023 showed sinus rhythm with occasional Mobitz 1 second-degree AV block, occasional PAC, brief PAT, 5 beats nonsustained ventricular tachycardia.  Neck CTA September 2023 showed no significant focal stenosis, lesion at C2-C3 which may represent "a metastasis or myeloma; brown tumor or focal renal osteodystrophy also considered" and whole-body bone scan recommended to look for other lesions and if negative follow-up CT without contrast in 3 to 6 months recommended.  Since last seen,   Current Outpatient Medications  Medication Sig Dispense Refill   acetaminophen (TYLENOL) 500 MG tablet Take 1,000 mg by mouth Every Tuesday,Thursday,and Saturday with dialysis.     ALPRAZolam (XANAX) 0.25 MG tablet Take 0.25 mg by mouth daily.     aspirin EC 81 MG tablet Take 81 mg by mouth daily. Swallow whole.     atorvastatin (LIPITOR) 80 MG tablet Take 80 mg by mouth in the morning.     bismuth subsalicylate (PEPTO BISMOL) 262 MG/15ML suspension Take 30 mLs by mouth every 6 (six) hours as needed for indigestion.     clopidogrel  (PLAVIX) 75 MG tablet TAKE ONE TABLET BY MOUTH ONCE DAILY (Patient taking differently: Take 75 mg by mouth daily.) 30 tablet 11   ezetimibe (ZETIA) 10 MG tablet Take 1 tablet (10 mg total) by mouth daily. (Patient not taking: Reported on 05/11/2022) 30 tablet 3   gabapentin (NEURONTIN) 100 MG capsule Take 100 mg by mouth 3 (three) times daily.     HUMALOG MIX 75/25 KWIKPEN (75-25) 100 UNIT/ML KwikPen Inject 30 Units into the skin daily.     insulin aspart protamine - aspart (NOVOLOG MIX 70/30 FLEXPEN) (70-30) 100 UNIT/ML FlexPen Inject 0.3 mLs (30 Units total) into the skin 2 (two) times daily with a meal. (Patient taking differently: Inject 0-30 Units into the skin 2 (two) times daily as needed.) 15 mL 0   Insulin Pen Needle 31G X 5 MM MISC Use daily to inject insulin as instructed 100 each 2   linaclotide (LINZESS) 72 MCG capsule Take 1 capsule (72 mcg total) by mouth daily before breakfast. (Patient taking differently: Take 72 mcg by mouth in the morning.) 8 capsule 0   oxyCODONE (ROXICODONE) 5 MG immediate release tablet 1-2 tabs PO q6 hours prn pain 20 tablet 0   pantoprazole (PROTONIX) 40 MG tablet TAKE ONE TABLET BY MOUTH EVERY MORNING and TAKE ONE TABLET BY MOUTH EVERYDAY AT BEDTIME (Patient taking differently: Take 40 mg by mouth daily.) 180 tablet 1   sulfamethoxazole-trimethoprim (BACTRIM DS) 800-160 MG tablet Take 1 tablet by mouth 2 (two) times daily.     VELPHORO 500 MG chewable tablet Chew 1,000 mg by mouth 2 (two) times daily.     No current facility-administered medications for this  visit.     Past Medical History:  Diagnosis Date   Allergy    Anemia    getting Venofer with dialysis   Anxiety    self reported, sometimes-situational anxiety   Arthritis    bilateral hands/LEFT hip   CHF (congestive heart failure) (HCC)    Chronic kidney disease    t th s dialysis- [place] ---ESRD   Coronary artery disease    Depression    self reported, sometimes-situational depression    Diabetes mellitus    type II- on meds   Dyspnea    sometimes- fluid   GERD (gastroesophageal reflux disease)    on meds   History of blood transfusion    Hypertension    no longer on medication since starting on Hemodialysis   Myocardial infarction ([place]) 2019   Peripheral vascular disease ([place])    Sleep apnea    does not use CPAP    Past Surgical History:  Procedure Laterality Date   A/V FISTULAGRAM Left 03/06/2020   Procedure: A/V FISTULAGRAM;  Surgeon: Waynetta Sandy, MD;  Location: [place] [facility];  Service: Cardiovascular;  Laterality: Left;   ABDOMINAL AORTOGRAM N/A 01/07/2022   Procedure: ABDOMINAL AORTOGRAM;  Surgeon: Waynetta Sandy, MD;  Location: [place] [facility];  Service: Cardiovascular;  Laterality: N/A;   ABDOMINAL AORTOGRAM W/LOWER EXTREMITY N/A 04/09/2021   Procedure: ABDOMINAL AORTOGRAM W/LOWER EXTREMITY;  Surgeon: Waynetta Sandy, MD;  Location: [place] [facility];  Service: Cardiovascular;  Laterality: N/A;   AMPUTATION Left 05/10/2016   Procedure: Left Transmetatarsal Amputation;  Surgeon: [name], MD;  Location: [place];  Service: Orthopedics;  Laterality: Left;   AMPUTATION Right 05/16/2018   Procedure: PARTIAL RAY AMPUTATION FIFTH TOE RIGHT FOOT;  Surgeon: [name], DPM;  Location: [place];  Service: Podiatry;  Laterality: Right;   AMPUTATION Right 06/17/2018   Procedure: AMPUTATION 4th RAY Right Foot;  Surgeon: Trula Slade, DPM;  Location: [place];  Service: Podiatry;  Laterality: Right;   AMPUTATION Right 06/19/2018   Procedure: RIGHT BELOW KNEE AMPUTATION;  Surgeon: [name], MD;  Location: [place];  Service: Orthopedics;  Laterality: Right;   AMPUTATION Left 10/05/2021   Procedure: LEFT RING FINGER AMPUTATION;  Surgeon: Daryll Brod, MD;  Location: [place];  Service: Orthopedics;  Laterality: Left;  45 MIN   AMPUTATION Left 11/15/2021   Procedure: LEFT INDEX FINGER AMPUTATION;  Surgeon: Leanora Cover, MD;   Location: [place];  Service: Orthopedics;  Laterality: Left;  60 MIN   AMPUTATION Right 01/08/2022   Procedure: AMPUTATION ABOVE KNEE;  Surgeon: Waynetta Sandy, MD;  Location: [place];  Service: Vascular;  Laterality: Right;   AMPUTATION Bilateral 01/12/2022   Procedure: LEFT INDEX FINGER AMPUTATION, RIGHT INDEX, LONG AND SMALL DIGITS AMPUTATION;  Surgeon: Leanora Cover, MD;  Location: [place];  Service: Orthopedics;  Laterality: Bilateral;   AMPUTATION Right 04/01/2022   Procedure: RIGHT INDEX FINGER AMPUTATION AT METACARPAL PHALANGEAL JOINT, INCISION AND DRAINAGE RIGHT HAND;  Surgeon: Leanora Cover, MD;  Location: [place];  Service: Orthopedics;  Laterality: Right;  90 MIN   AMPUTATION Right 05/23/2022   Procedure: RIGHT INDEX FINGER RAY AMPUTATION; RIGHT LONG, RING, AND SMALL FINGER AMPUTATIONS;  Surgeon: Leanora Cover, MD;  Location: [place];  Service: Orthopedics;  Laterality: Right;  90 MIN   AORTIC ARCH ANGIOGRAPHY N/A 01/07/2022   Procedure: AORTIC ARCH ANGIOGRAPHY;  Surgeon: Waynetta Sandy, MD;  Location: [place] [facility];  Service:  Cardiovascular;  Laterality: N/A;   APPLICATION OF WOUND VAC Right 06/19/2018   Procedure: APPLICATION OF WOUND VAC;  Surgeon: [name], MD;  Location: [place];  Service: Orthopedics;  Laterality: Right;   AV FISTULA PLACEMENT Left 11/25/2019   Procedure: LEFT ARM Brachiocephalic  ARTERIOVENOUS (AV) FISTULA CREATION;  Surgeon: Rosetta Posner, MD;  Location: [place];  Service: Vascular;  Laterality: Left;   [place] Left 07/19/2020   Procedure: LEFT SECOND STAGE [place];  Surgeon: Waynetta Sandy, MD;  Location: [place];  Service: Vascular;  Laterality: Left;   CIRCUMCISION  09/02/2011   Procedure: CIRCUMCISION ADULT;  Surgeon: Molli Hazard, MD;  Location: [place];  Service: Urology;  Laterality: N/A;   CORONARY STENT INTERVENTION N/A 07/29/2018   Procedure: CORONARY STENT  INTERVENTION;  Surgeon: Leonie Man, MD;  Location: [place] [facility];  Service: Cardiovascular;  Laterality: N/A;   DEBRIDEMENT AND CLOSURE WOUND Left 11/15/2021   Procedure: LEFT HAND WOUND CLOSURE;  Surgeon: Leanora Cover, MD;  Location: [place];  Service: Orthopedics;  Laterality: Left;   I & D EXTREMITY  09/02/2011   Procedure: IRRIGATION AND DEBRIDEMENT EXTREMITY;  Surgeon: Theodoro Kos, DO;  Location: [place];  Service: Plastics;  Laterality: N/A;   INCISION AND DRAINAGE OF WOUND  08/12/2011   Procedure: IRRIGATION AND DEBRIDEMENT WOUND;  Surgeon: Zenovia Jarred, MD;  Location: [place];  Service: General;;  perineum   INSERTION OF DIALYSIS CATHETER N/A 11/25/2019   Procedure: INSERTION OF [place] Internal Jugular DIALYSIS CATHETER;  Surgeon: Rosetta Posner, MD;  Location: [place];  Service: Vascular;  Laterality: N/A;   [place]  08/12/2011   Procedure: IRRIGATION AND DEBRIDEMENT ABSCESS;  Surgeon: Molli Hazard, MD;  Location: [place];  Service: Urology;  Laterality: N/A;  irrigation and debridment perineum     IRRIGATION AND DEBRIDEMENT ABSCESS  08/14/2011   Procedure: MINOR INCISION AND DRAINAGE OF ABSCESS;  Surgeon: Molli Hazard, MD;  Location: [place];  Service: Urology;  Laterality: N/A;  Perineal Wound Debridement;Placement Bilateral Testicular Thigh Pouches   LEFT HEART CATH AND CORONARY ANGIOGRAPHY N/A 07/29/2018   Procedure: LEFT HEART CATH AND CORONARY ANGIOGRAPHY;  Surgeon: Leonie Man, MD;  Location: [place] [facility];  Service: Cardiovascular;  Laterality: N/A;   LOWER EXTREMITY ANGIOGRAPHY Bilateral 01/07/2022   Procedure: Lower Extremity Angiography;  Surgeon: Waynetta Sandy, MD;  Location: [place] [facility];  Service: Cardiovascular;  Laterality: Bilateral;   PERIPHERAL VASCULAR ATHERECTOMY Left 04/09/2021   Procedure: PERIPHERAL VASCULAR ATHERECTOMY;  Surgeon: Waynetta Sandy, MD;  Location:  [place] [facility];  Service: Cardiovascular;  Laterality: Left;  TP trunk and peroneal arteries   PERIPHERAL VASCULAR BALLOON ANGIOPLASTY Left 04/09/2021   Procedure: PERIPHERAL VASCULAR BALLOON ANGIOPLASTY;  Surgeon: Waynetta Sandy, MD;  Location: [place] [facility];  Service: Cardiovascular;  Laterality: Left;  Popliteal   REVISON OF ARTERIOVENOUS FISTULA Left 05/24/2020   Procedure: LEFT ARM ARTERIOVENOUS FISTULA  CONVERSION TO BASILIC VEIN FISTULA;  Surgeon: Waynetta Sandy, MD;  Location: [place];  Service: Vascular;  Laterality: Left;   TOTAL HIP ARTHROPLASTY Left 10/11/2016   Procedure: LEFT TOTAL HIP ARTHROPLASTY ANTERIOR APPROACH;  Surgeon: Mcarthur Rossetti, MD;  Location: [facility];  Service: Orthopedics;  Laterality: Left;   UPPER EXTREMITY ANGIOGRAPHY Bilateral 01/07/2022   Procedure: Upper Extremity Angiography;  Surgeon: Waynetta Sandy, MD;  Location: [place]  [facility];  Service: Cardiovascular;  Laterality: Bilateral;    Social History   Socioeconomic History   Marital status: Widowed    Spouse name: Not on file   Number of children: Not on file   Years of education: Not on file   Highest education level: Not on file  Occupational History   Occupation: Disabled  Tobacco Use   Smoking status: Former    Packs/day: 0.25    Years: 24.00    Total pack years: 6.00    Types: Cigarettes    Quit date: 11/19/2019    Years since quitting: 2.6   Smokeless tobacco: Never  Vaping Use   Vaping Use: Never used  Substance and Sexual Activity   Alcohol use: Not Currently    Alcohol/week: 0.0 standard drinks of alcohol    Comment: quit 2018   Drug use: No   Sexual activity: Not Currently    Birth control/protection: Condom  Other Topics Concern   Not on file  Social History Narrative   Lives w/ his 2 sons.   Social Determinants of Health   Financial Resource Strain: Not on file  Food Insecurity: Not on file  Transportation Needs: Not on  file  Physical Activity: Not on file  Stress: Not on file  Social Connections: Not on file  Intimate Partner Violence: Not on file    Family History  Problem Relation Age of Onset   Diabetes Other    Pancreatic cancer Mother 57   Colon cancer Neg Hx    Colon polyps Neg Hx    Esophageal cancer Neg Hx    Rectal cancer Neg Hx    Stomach cancer Neg Hx     ROS: no fevers or chills, productive cough, hemoptysis, dysphasia, odynophagia, melena, hematochezia, dysuria, hematuria, rash, seizure activity, orthopnea, PND, pedal edema, claudication. Remaining systems are negative.  Physical Exam: Well-developed well-nourished in no acute distress.  Skin is warm and dry.  HEENT is normal.  Neck is supple.  Chest is clear to auscultation with normal expansion.  Cardiovascular exam is regular rate and rhythm.  Abdominal exam nontender or distended. No masses palpated. Extremities show no edema. neuro grossly intact  ECG- personally reviewed  A/P  1 coronary artery disease-patient denies chest pain.  Plan to continue aspirin and statin.  2 hyperlipidemia-previously did not tolerate Crestor.  Continue Lipitor.  3 chronic combined systolic/diastolic congestive heart failure-managed with dialysis.  4 cardiomyopathy-LV function mildly reduced on most recent echocardiogram.  We have not added guideline directed medical therapy due to borderline blood pressure on dialysis days.  5 valvular heart disease-history of severe mitral and tricuspid regurgitation on echocardiogram at [facility].  We will repeat study.  6 end-stage renal disease-dialysis per nephrology.  7 Mobitz 1 second-degree AV block-patient has not had syncope.  We will continue to follow.  8 peripheral vascular disease-continue aspirin and statin.  9 C2/C3 lesion-I have asked him to follow-up with primary care for this issue.  Kirk Ruths, MD

## 2022-07-06 DIAGNOSIS — N2581 Secondary hyperparathyroidism of renal origin: Secondary | ICD-10-CM | POA: Diagnosis not present

## 2022-07-06 DIAGNOSIS — E1129 Type 2 diabetes mellitus with other diabetic kidney complication: Secondary | ICD-10-CM | POA: Diagnosis not present

## 2022-07-06 DIAGNOSIS — L299 Pruritus, unspecified: Secondary | ICD-10-CM | POA: Diagnosis not present

## 2022-07-06 DIAGNOSIS — D631 Anemia in chronic kidney disease: Secondary | ICD-10-CM | POA: Diagnosis not present

## 2022-07-06 DIAGNOSIS — T8249XD Other complication of vascular dialysis catheter, subsequent encounter: Secondary | ICD-10-CM | POA: Diagnosis not present

## 2022-07-06 DIAGNOSIS — N186 End stage renal disease: Secondary | ICD-10-CM | POA: Diagnosis not present

## 2022-07-06 DIAGNOSIS — R52 Pain, unspecified: Secondary | ICD-10-CM | POA: Diagnosis not present

## 2022-07-06 DIAGNOSIS — D689 Coagulation defect, unspecified: Secondary | ICD-10-CM | POA: Diagnosis not present

## 2022-07-06 DIAGNOSIS — Z992 Dependence on renal dialysis: Secondary | ICD-10-CM | POA: Diagnosis not present

## 2022-07-06 NOTE — Progress Notes (Signed)
PERL, KERNEY ([id number]) [id number] Page 1 of 18 Visit Report for 07/05/2022 Chief Complaint Document Details Patient Name: Date of Service: Edward [name] MES L. 07/05/2022 2:30 PM Medical Record Number: [id number] Patient Account Number: [id number] Date of Birth/Sex: Treating RN: 03/21/1971 (51 y.o. M) Primary Care Provider: [name] Other Clinician: Referring Provider: Treating Provider/Extender: Kevan Ny in Treatment: 7 Information Obtained from: Patient Chief Complaint Patients presents for treatment of an open diabetic ulcer Electronic Signature(s) Signed: 07/05/2022 4:15:24 PM By: Fredirick Maudlin MD FACS Entered By: Fredirick Maudlin on 07/05/2022 16:15:23 -------------------------------------------------------------------------------- Debridement Details Patient Name: Date of Service: Edward [name], [name] 07/05/2022 2:30 PM Medical Record Number: [id number] Patient Account Number: [id number] Date of Birth/Sex: Treating RN: 04/13/1971 (51 y.o. [name] Primary Care Provider: [name] Other Clinician: Referring Provider: Treating Provider/Extender: Kevan Ny in Treatment: 7 Debridement Performed for Assessment: Wound #15 Left,Proximal,Anterior Lower Leg Performed By: Physician Fredirick Maudlin, MD Debridement Type: Debridement Severity of Tissue Pre Debridement: Fat layer exposed Level of Consciousness (Pre-procedure): Awake and Alert Pre-procedure Verification/Time Out Yes - 15:25 Taken: Start Time: 15:25 Pain Control: Lidocaine 5% topical ointment T Area Debrided (L x W): otal 0.5 (cm) x 0.4 (cm) = 0.2 (cm) Tissue and other material debrided: Non-Viable, Slough, Slough Level: Non-Viable Tissue Debridement Description: Selective/Open Wound Instrument: Curette Bleeding: Minimum Hemostasis Achieved: Pressure End Time: 15:27 Procedural Pain: 0 Post Procedural  Pain: 0 Response to Treatment: Procedure was tolerated well Level of Consciousness (Post- Awake and Alert procedure): Post Debridement Measurements of Total Wound Length: (cm) 0.5 Width: (cm) 0.4 Depth: (cm) 0.1 Volume: (cm) 0.016 Character of Wound/Ulcer Post Debridement: Improved Severity of Tissue Post Debridement: Fat layer exposed SALLY, REIMERS ([id number]) [id number] Page 2 of 18 Post Procedure Diagnosis Same as Pre-procedure Notes Scribed for Dr. Celine Ahr by J.Scotton Electronic Signature(s) Signed: 07/05/2022 4:59:08 PM By: Dellie Catholic RN Signed: 07/05/2022 5:13:10 PM By: Fredirick Maudlin MD FACS Entered By: Dellie Catholic on 07/05/2022 15:29:26 -------------------------------------------------------------------------------- Debridement Details Patient Name: Date of Service: Edward [name], [name] 07/05/2022 2:30 PM Medical Record Number: [id number] Patient Account Number: [id number] Date of Birth/Sex: Treating RN: [birth year] (51 y.o. [name] Primary Care Provider: [name] Other Clinician: Referring Provider: Treating Provider/Extender: Kevan Ny in Treatment: 7 Debridement Performed for Assessment: Wound #13 Left,Circumferential Lower Leg Performed By: Physician Fredirick Maudlin, MD Debridement Type: Debridement Severity of Tissue Pre Debridement: Fat layer exposed Level of Consciousness (Pre-procedure): Awake and Alert Pre-procedure Verification/Time Out Yes - 15:25 Taken: Start Time: 15:25 Pain Control: Lidocaine 5% topical ointment T Area Debrided (L x W): otal 21 (cm) x 18.5 (cm) = 388.5 (cm) Tissue and other material debrided: Non-Viable, Slough, Slough Level: Non-Viable Tissue Debridement Description: Selective/Open Wound Instrument: Curette Bleeding: Minimum Hemostasis Achieved: Pressure End Time: 15:27 Procedural Pain: 0 Post Procedural Pain: 0 Response to Treatment:  Procedure was tolerated well Level of Consciousness (Post- Awake and Alert procedure): Post Debridement Measurements of Total Wound Length: (cm) 21 Width: (cm) 18.5 Depth: (cm) 0.1 Volume: (cm) 30.513 Character of Wound/Ulcer Post Debridement: Improved Severity of Tissue Post Debridement: Fat layer exposed Post Procedure Diagnosis Same as Pre-procedure Electronic Signature(s) Signed: 07/05/2022 4:59:08 PM By: Dellie Catholic RN Signed: 07/05/2022 5:13:10 PM By: Fredirick Maudlin MD FACS Entered By: Dellie Catholic on 07/05/2022 15:34:29 [name] ([id number]) [id number] Page 3 of 18 -------------------------------------------------------------------------------- Debridement Details Patient Name: Date of Service: Edward [name],  [name] 07/05/2022 2:30 PM Medical Record Number: [id number] Patient Account Number: [id number] Date of Birth/Sex: Treating RN: 28-Apr-1971 (51 y.o. [name] Primary Care Provider: [name] Other Clinician: Referring Provider: Treating Provider/Extender: Kevan Ny in Treatment: 7 Debridement Performed for Assessment: Wound #11 Left,Anterior Lower Leg Performed By: Physician Fredirick Maudlin, MD Debridement Type: Debridement Severity of Tissue Pre Debridement: Fat layer exposed Level of Consciousness (Pre-procedure): Awake and Alert Pre-procedure Verification/Time Out Yes - 15:25 Taken: Start Time: 15:25 Pain Control: Lidocaine 5% topical ointment T Area Debrided (L x W): otal 7.4 (cm) x 14 (cm) = 103.6 (cm) Tissue and other material debrided: Non-Viable, Slough, Slough Level: Non-Viable Tissue Debridement Description: Selective/Open Wound Instrument: Curette Bleeding: Minimum Hemostasis Achieved: Pressure End Time: 15:27 Procedural Pain: 0 Post Procedural Pain: 0 Response to Treatment: Procedure was tolerated well Level of Consciousness (Post- Awake and  Alert procedure): Post Debridement Measurements of Total Wound Length: (cm) 7.4 Width: (cm) 14 Depth: (cm) 0.1 Volume: (cm) 8.137 Character of Wound/Ulcer Post Debridement: Improved Severity of Tissue Post Debridement: Fat layer exposed Post Procedure Diagnosis Same as Pre-procedure Notes Scribed for Dr. Celine Ahr by J.S. Electronic Signature(s) Signed: 07/05/2022 4:59:08 PM By: Dellie Catholic RN Signed: 07/05/2022 5:13:10 PM By: Fredirick Maudlin MD FACS Entered By: Dellie Catholic on 07/05/2022 15:37:03 -------------------------------------------------------------------------------- Debridement Details Patient Name: Date of Service: Edward [name], [name] 07/05/2022 2:30 PM Medical Record Number: [id number] Patient Account Number: [id number] Date of Birth/Sex: Treating RN: [birth year] (51 y.o. [name] Primary Care Provider: [name] Other Clinician: Referring Provider: Treating Provider/Extender: Kevan Ny in Treatment: 7 Debridement Performed for Assessment: Wound #12 Left Achilles Performed By: Physician Fredirick Maudlin, MD Debridement Type: Debridement Severity of Tissue Pre Debridement: Fat layer exposed Level of Consciousness (Pre-procedure): Awake and Alert Pre-procedure Verification/Time Out Yes - 15:25 Taken: Start Time: 15:25 Pain Control: Lidocaine 5% topical ointment AKSHITH, MONCUS ([id number]) [id number] Page 4 of 18 T Area Debrided (L x W): otal 3.5 (cm) x 4.5 (cm) = 15.75 (cm) Tissue and other material debrided: Non-Viable, Eschar, [place], Other: Tendon Level: Non-Viable Tissue Debridement Description: Selective/Open Wound Instrument: Curette Bleeding: Minimum Hemostasis Achieved: Pressure End Time: 15:27 Procedural Pain: 0 Post Procedural Pain: 0 Response to Treatment: Procedure was tolerated well Level of Consciousness (Post- Awake and Alert procedure): Post Debridement  Measurements of Total Wound Length: (cm) 3.5 Width: (cm) 4.5 Depth: (cm) 0.1 Volume: (cm) 1.237 Character of Wound/Ulcer Post Debridement: Improved Severity of Tissue Post Debridement: Fat layer exposed Post Procedure Diagnosis Same as Pre-procedure Notes Scribed for Dr. Celine Ahr by J.S. Electronic Signature(s) Signed: 07/05/2022 4:59:08 PM By: Dellie Catholic RN Signed: 07/05/2022 5:13:10 PM By: Fredirick Maudlin MD FACS Entered By: Dellie Catholic on 07/05/2022 15:38:40 -------------------------------------------------------------------------------- Debridement Details Patient Name: Date of Service: Edward [name], [name] 07/05/2022 2:30 PM Medical Record Number: [id number] Patient Account Number: [id number] Date of Birth/Sex: Treating RN: [birth year] (51 y.o. [name] Primary Care Provider: [name] Other Clinician: Referring Provider: Treating Provider/Extender: Kevan Ny in Treatment: 7 Debridement Performed for Assessment: Wound #14 Left,Dorsal Foot Performed By: Physician Fredirick Maudlin, MD Debridement Type: Debridement Severity of Tissue Pre Debridement: Fat layer exposed Level of Consciousness (Pre-procedure): Awake and Alert Pre-procedure Verification/Time Out Yes - 15:25 Taken: Start Time: 15:25 Pain Control: Lidocaine 5% topical ointment T Area Debrided (L x W): otal 1.2 (cm) x 1.8 (cm) = 2.16 (cm) Tissue and  other material debrided: Non-Viable, Eschar, [place] Level: Non-Viable Tissue Debridement Description: Selective/Open Wound Instrument: Curette Bleeding: Minimum Hemostasis Achieved: Pressure End Time: 15:27 Procedural Pain: 0 Post Procedural Pain: 0 Response to Treatment: Procedure was tolerated well Level of Consciousness (Post- Awake and Alert procedure): Post Debridement Measurements of Total Wound Length: (cm) 1.2 Width: (cm) 1.8 ERIEL, DOYON ([id number]) [id number]  Page 5 of 18 Depth: (cm) 0.1 Volume: (cm) 0.17 Character of Wound/Ulcer Post Debridement: Improved Severity of Tissue Post Debridement: Fat layer exposed Post Procedure Diagnosis Same as Pre-procedure Notes Scribed for Dr. Celine Ahr by J.S. Electronic Signature(s) Signed: 07/05/2022 4:59:08 PM By: Dellie Catholic RN Signed: 07/05/2022 5:13:10 PM By: Fredirick Maudlin MD FACS Entered By: Dellie Catholic on 07/05/2022 15:39:38 -------------------------------------------------------------------------------- Debridement Details Patient Name: Date of Service: Edward [name], [name] 07/05/2022 2:30 PM Medical Record Number: [id number] Patient Account Number: [id number] Date of Birth/Sex: Treating RN: 08-03-1971 (51 y.o. [name] Primary Care Provider: [name] Other Clinician: Referring Provider: Treating Provider/Extender: Kevan Ny in Treatment: 7 Debridement Performed for Assessment: Wound #16 Left,Distal,Medial Lower Leg Performed By: Physician Fredirick Maudlin, MD Debridement Type: Debridement Severity of Tissue Pre Debridement: Fat layer exposed Level of Consciousness (Pre-procedure): Awake and Alert Pre-procedure Verification/Time Out Yes - 15:25 Taken: Start Time: 15:25 Pain Control: Lidocaine 5% topical ointment T Area Debrided (L x W): otal 3 (cm) x 0.5 (cm) = 1.5 (cm) Tissue and other material debrided: Non-Viable, Eschar, Slough, Slough Level: Non-Viable Tissue Debridement Description: Selective/Open Wound Instrument: Curette Bleeding: Minimum Hemostasis Achieved: Pressure End Time: 15:27 Procedural Pain: 0 Post Procedural Pain: 0 Response to Treatment: Procedure was tolerated well Level of Consciousness (Post- Awake and Alert procedure): Post Debridement Measurements of Total Wound Length: (cm) 3 Width: (cm) 0.5 Depth: (cm) 0.1 Volume: (cm) 0.118 Character of Wound/Ulcer Post Debridement: Improved Severity of Tissue  Post Debridement: Fat layer exposed Post Procedure Diagnosis Same as Pre-procedure Notes Scribed for Dr. Celine Ahr by J.Scotton Electronic Signature(s) Signed: 07/05/2022 4:59:08 PM By: Dellie Catholic RN Signed: 07/05/2022 5:13:10 PM By: Fredirick Maudlin MD FACS Entered By: Dellie Catholic on 07/05/2022 15:40:37 [name] ([id number]) [id number] Page 6 of 18 -------------------------------------------------------------------------------- HPI Details Patient Name: Date of Service: Edward [name] MES L. 07/05/2022 2:30 PM Medical Record Number: [id number] Patient Account Number: [id number] Date of Birth/Sex: Treating RN: [birth year] (51 y.o. M) Primary Care Provider: [name] Other Clinician: Referring Provider: Treating Provider/Extender: Kevan Ny in Treatment: 7 History of Present Illness HPI Description: Admission 09/24/2021 Mr. 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 He developed cellulitis to the left fourth finger and was treated with doxycycline at urgent care. The nail is coming off. He reports taking the antibiotics currently. He has been keeping the area covered. He also presents with 2 left lower extremity wounds. He reports that the more proximal wound has been present for several months and the more distal wound has developed more recently over the past several weeks. He had an abdominal aortogram to the left lower extremity with laser atherectomy to the left popliteal, left tibioperoneal trunk and left peroneal arteries, drug-coated balloon angioplasty of the left popliteal artery and plain balloon angioplasty of the left tibioperoneal trunk and left peroneal artery. He has not followed up with vein and vascular since  his  procedure on 04/09/2021. He currently denies signs of infection. He currently denies pain. 11/28/2021: The patient has not been seen since his initial admission. He has not followed up with vascular surgery, either. He did undergo amputation of his left fourth finger and is currently under the care of orthopedics and occupational therapy for this. He presents today with multiple wounds on his bilateral lower extremities. He is very fluid overloaded, based upon the appearance of his extremities. These all seem to have started as blisters that have subsequently opened and caused ulcers. He does have a shrinker sock for his right BKA, but he is unable to wear it secondary to pain. He also has a compression stocking for his left lower extremity but he has been unable to apply it due to the amputation on his left hand. The largest and most painful of these wounds is just above the knee on the right. All of the wounds have eschar and some slough present. No overt signs of infection. 12/05/2021: All of the wounds look about the same. He has new blisters that have opened up revealing denuded skin underneath. The eschar is extremely hard and where there is not eschar, the wounds are gritty. He was unable to tolerate the Ace wrap at the tightness that we applied it last week and ended up having to have it loosened. He has been tolerating Kerlix and Coban on the left leg. He has not received a follow-up appointment with vascular surgery yet. We have been using Santyl under Hydrofera Blue. 12/12/2021: We have been unable to secure home health care assistance. The patient and his son are doing his wound care at home. He does have a follow-up visit in vascular surgery on May 24. He appears to be less fluid overloaded today. The wound on his right medial thigh looks bigger today and has a thick layer of hard dark eschar. The wound on his lateral right stump looks somewhat improved with softer eschar and slough. There is  also slough on the wound overlying the end of the stump and wrapping around to the backside of it. On his left leg, the edema control is improved. The wound on his anterior tibial surface is small and clean. On his dorsal and medial ankle, there is slough buildup. 12/19/2021: The wound on his left anterior tibial surface is closed. The wound on his dorsal left foot looks about the same with just a bit of slough buildup. The surface is gritty. The wound on his right medial thigh looks about the same today and has reaccumulated the hard dark eschar, despite vigorous debridement last week. The wound on the right lateral stump is about the same and has less eschar and slough. Overlying the end of the stump and wrapping around the backside of it, the wounds have converged. Overall, his edema control is much better than it has been and certainly than it was on the first visit. 12/26/2021: The wound on his dorsal left foot has a bit of slough accumulation but the surface is less gritty. The wound on his left medial calf is clean with a good bit of granulation tissue. The wounds on his right thigh and stump continue to display a heavy thick layer of black eschar. 01/01/2022: The wound on his dorsal left foot is fairly clean but still has a gritty surface. The wound on his left medial calf is nearly closed with just a little bit of overlying eschar. Unfortunately, the wounds on his right thigh  and stump have deteriorated. 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 He has a fluctuant area near the proximal part of his medial thigh wound. There is an odor coming from that leg but I am not sure which of the wounds is producing it or if both of them are. 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 He is now back for  further evaluation and management of 3 small wounds on his left lower leg, just above the ankle on the anterior tibial surface. They are very fibrotic and dry. Muscle fibers are visible in the most proximal wound. There is slough and leathery eschar accumulation present. 06/20/2022: Mr. Grunder returns to clinic after having been in the hospital for quite some time. He has returned with multiple new wounds. The largest of these is a nearly circumferential wound on the mid to lower portion of his left lower leg. He has wounds on his Achilles tendon area with muscle and tendon exposed. The anterior ankle wound has thick black eschar accumulated on the surface. The dorsal foot wound also has slough and eschar buildup. All of the wound surfaces are incredibly fibrotic and do not look particularly viable. 06/27/2022: All of the wounds are bigger this week, but most of them have an improved surface, with less fibrosis and even some pink coloration, particularly the circumferential leg wound. The anterior ankle wound has some tendon exposure underneath fibrinous slough in the proximal anterior leg wound remains fairly dry and tough. 07/05/2022: Cultures that I took were positive for Pseudomonas aeruginosa. I prescribed levofloxacin, but the patient did not fill or take it because he has been taking ciprofloxacin, prescribed by his orthopedic surgeon. All of the wound surfaces actually look better this week. There is a film that is stained yellow from the Iodoflex/Iodosorb. Still with extensive slough on all wound surfaces. He still has leathery eschar on his dorsal foot wound and the Achilles tendon is exposed and is quite desiccated. Electronic Signature(s) Signed: 07/05/2022 4:17:29 PM By: Fredirick Maudlin MD FACS Entered By: Fredirick Maudlin on 07/05/2022 16:17:29 [name] ([id number]) [id number] Page 7 of  18 -------------------------------------------------------------------------------- Physical Exam Details Patient Name: Date of Service: Edward [name] MES L. 07/05/2022 2:30 PM Medical Record Number: [id number] Patient Account Number: [id number] Date of Birth/Sex: Treating RN: Jun 27, 1971 (51 y.o. M) Primary Care Provider: [name] Other Clinician: Referring Provider: Treating Provider/Extender: Kevan Ny in Treatment: 7 Constitutional . . . . No acute distress.Marland Kitchen Respiratory Normal work of breathing on room air.. Notes 07/05/2022: All of the wound surfaces actually look better this week. There is a film that is stained yellow from the Iodoflex/Iodosorb. Still with extensive slough on all wound surfaces. He still has leathery eschar on his dorsal foot wound and the Achilles tendon is exposed and is quite desiccated. His leg is also more edematous and quite warm. Electronic Signature(s) Signed: 07/05/2022 4:18:36 PM By: Fredirick Maudlin MD FACS Entered By: Fredirick Maudlin on 07/05/2022 16:18:36 -------------------------------------------------------------------------------- Physician Orders Details Patient Name: Date of Service: Edward [name], [name] 07/05/2022 2:30 PM Medical Record Number: [id number] Patient Account Number: [id number] Date of Birth/Sex: Treating RN: 06-10-1971 (51 y.o. 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 without evidence of necrosis N18.6 End stage renal disease Z89.611 Acquired absence of right leg above knee Z89.432 Acquired absence of left foot I50.42 Chronic combined systolic (congestive) and diastolic (congestive) heart  failure I73.9 Peripheral vascular disease, unspecified E11.52 Type 2 diabetes mellitus with diabetic peripheral angiopathy with gangrene L97.522 Non-pressure chronic ulcer of other part of left foot with fat layer exposed L97.325 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 - Dr. Celine Ahr Room 3 Return A Anesthetic Wound #11 Left,Anterior Lower Leg (In clinic) Topical Lidocaine 5% applied to wound bed BYAN, POPLASKI ([id number]) [id number] Page 8 of 18 Bathing/ Shower/ Hygiene May shower and wash wound with soap and water. - use antibacterial soap to wash legs and wounds when changing dressing. right leg Edema Control - Lymphedema / SCD / Other Elevate legs to the level of the heart or above for 30 minutes daily and/or when sitting, a frequency of: - throughout the day Avoid standing for long periods of time. Additional Orders / Instructions Follow Nutritious Diet - Monitor/Control Blood Sugars Wound Treatment Wound #11 - Lower Leg Wound Laterality: Left, Anterior Cleanser: Soap and Water Every Other Day/30 Days Discharge Instructions: May shower and wash wound with dial antibacterial soap and water prior to dressing change. Peri-Wound Care: Sween Lotion (Moisturizing lotion) Every Other Day/30 Days Discharge Instructions: Apply moisturizing lotion as directed Prim Dressing: IODOFLEX 0.9% Cadexomer Iodine Pad 4x6 cm (Generic) Every Other Day/30 Days ary Discharge Instructions: Use Iodosorb if Iodoflex not available Secondary Dressing: ABD Pad, 5x9 (Generic) Every Other Day/30 Days Discharge Instructions: Apply over primary dressing as directed. Secondary Dressing: Woven Gauze Sponge, Non-Sterile 4x4 in (Generic) Every Other Day/30 Days Discharge Instructions: Apply over  primary dressing as directed. Secured With: [employer], 4.5x3.1 (in/yd) (Generic) Every Other Day/30 Days Discharge Instructions: Secure with Kerlix as directed. Secured With: 30M Medipore H Soft Cloth Surgical T ape, 4 x 10 (in/yd) (Generic) Every Other Day/30 Days Discharge Instructions: Secure with tape as directed. Wound #12 - Achilles Wound Laterality: Left Cleanser: Soap and Water Every Other Day/30 Days Discharge Instructions: May shower and wash wound with dial antibacterial soap and water prior to dressing change. Peri-Wound Care: Sween Lotion (Moisturizing lotion) Every Other Day/30 Days Discharge Instructions: Apply moisturizing lotion as directed Prim Dressing: IODOFLEX 0.9% Cadexomer Iodine Pad 4x6 cm (Generic) Every Other Day/30 Days ary Discharge Instructions: Use Iodosorb if Iodoflex not available Secondary Dressing: ABD Pad, 5x9 (Generic) Every Other Day/30 Days Discharge Instructions: Apply over primary dressing as directed. Secondary Dressing: Woven Gauze Sponge, Non-Sterile 4x4 in (Generic) Every Other Day/30 Days Discharge Instructions: Apply over primary dressing as directed. Secured With: [employer], 4.5x3.1 (in/yd) (Generic) Every Other Day/30 Days Discharge Instructions: Secure with Kerlix as directed. Secured With: 30M Medipore H Soft Cloth Surgical T ape, 4 x 10 (in/yd) (Generic) Every Other Day/30 Days Discharge Instructions: Secure with tape as directed. Wound #13 - Lower Leg Wound Laterality: Left, Circumferential Cleanser: Soap and Water Every Other Day/30 Days Discharge Instructions: May shower and wash wound with dial antibacterial soap and water prior to dressing change. Peri-Wound Care: Sween Lotion (Moisturizing lotion) Every Other Day/30 Days Discharge Instructions: Apply moisturizing lotion  as directed Prim Dressing: IODOFLEX 0.9% Cadexomer Iodine Pad 4x6 cm (Generic) Every Other Day/30 Days ary Discharge Instructions: Use Iodosorb if  Iodoflex not available Secondary Dressing: ABD Pad, 5x9 (Generic) Every Other Day/30 Days Discharge Instructions: Apply over primary dressing as directed. Secondary Dressing: Woven Gauze Sponge, Non-Sterile 4x4 in (Generic) Every Other Day/30 Days Discharge Instructions: Apply over primary dressing as directed. Secured With: [employer], 4.5x3.1 (in/yd) (Generic) Every Other Day/30 Days Discharge Instructions: Secure with Kerlix as directed. Secured With: 49M Medipore H Soft Cloth Surgical T ape, 4 x 10 (in/yd) (Generic) Every Other Day/30 Days Discharge Instructions: Secure with tape as directed. KYRIN, GRATZ ([id number]) [id number] Page 9 of 18 Wound #14 - Foot Wound Laterality: Dorsal, Left Cleanser: Soap and Water Every Other Day/30 Days Discharge Instructions: May shower and wash wound with dial antibacterial soap and water prior to dressing change. Peri-Wound Care: Sween Lotion (Moisturizing lotion) Every Other Day/30 Days Discharge Instructions: Apply moisturizing lotion as directed Prim Dressing: IODOFLEX 0.9% Cadexomer Iodine Pad 4x6 cm (Generic) Every Other Day/30 Days ary Discharge Instructions: Use Iodosorb if Iodoflex not available Secondary Dressing: ABD Pad, 5x9 (Generic) Every Other Day/30 Days Discharge Instructions: Apply over primary dressing as directed. Secondary Dressing: Woven Gauze Sponge, Non-Sterile 4x4 in (Generic) Every Other Day/30 Days Discharge Instructions: Apply over primary dressing as directed. Secured With: [employer], 4.5x3.1 (in/yd) (Generic) Every Other Day/30 Days Discharge Instructions: Secure with Kerlix as directed. Secured With: 49M Medipore H Soft Cloth Surgical T ape, 4 x 10 (in/yd) (Generic) Every Other Day/30 Days Discharge Instructions: Secure with tape as directed. Wound #15 - Lower Leg Wound Laterality: Left, Anterior, Proximal Cleanser: Soap and Water Every Other Day/30 Days Discharge  Instructions: May shower and wash wound with dial antibacterial soap and water prior to dressing change. Peri-Wound Care: Sween Lotion (Moisturizing lotion) Every Other Day/30 Days Discharge Instructions: Apply moisturizing lotion as directed Prim Dressing: IODOFLEX 0.9% Cadexomer Iodine Pad 4x6 cm (Generic) Every Other Day/30 Days ary Discharge Instructions: Use Iodosorb if Iodoflex not available Secondary Dressing: ABD Pad, 5x9 (Generic) Every Other Day/30 Days Discharge Instructions: Apply over primary dressing as directed. Secondary Dressing: Woven Gauze Sponge, Non-Sterile 4x4 in (Generic) Every Other Day/30 Days Discharge Instructions: Apply over primary dressing as directed. Secured With: [employer], 4.5x3.1 (in/yd) (Generic) Every Other Day/30 Days Discharge Instructions: Secure with Kerlix as directed. Secured With: 49M Medipore H Soft Cloth Surgical T ape, 4 x 10 (in/yd) (Generic) Every Other Day/30 Days Discharge Instructions: Secure with tape as directed. Wound #16 - Lower Leg Wound Laterality: Left, Medial, Distal Cleanser: Soap and Water Every Other Day/30 Days Discharge Instructions: May shower and wash wound with dial antibacterial soap and water prior to dressing change. Peri-Wound Care: Sween Lotion (Moisturizing lotion) Every Other Day/30 Days Discharge Instructions: Apply moisturizing lotion as directed Prim Dressing: IODOFLEX 0.9% Cadexomer Iodine Pad 4x6 cm (Generic) Every Other Day/30 Days ary Discharge Instructions: Use Iodosorb if Iodoflex not available Secondary Dressing: ABD Pad, 5x9 (Generic) Every Other Day/30 Days Discharge Instructions: Apply over primary dressing as directed. Secondary Dressing: Woven Gauze Sponge, Non-Sterile 4x4 in (Generic) Every Other Day/30 Days Discharge Instructions: Apply over primary dressing as directed. Secured With: [employer], 4.5x3.1 (in/yd) (Generic) Every Other Day/30 Days Discharge Instructions: Secure with  Kerlix as directed. Secured With: 49M Medipore H Soft Cloth Surgical T ape, 4 x 10 (in/yd) (Generic) Every Other Day/30 Days Discharge Instructions: Secure with tape as directed. Electronic Signature(s) Signed: 07/05/2022  5:13:10 PM By: Fredirick Maudlin MD FACS Entered By: Fredirick Maudlin on 07/05/2022 16:20:05 [name] ([id number]) [id number] Page 10 of 18 -------------------------------------------------------------------------------- Problem List Details Patient Name: Date of Service: Edward [name] MES L. 07/05/2022 2:30 PM Medical Record Number: [id number] Patient Account Number: [id number] Date of Birth/Sex: Treating RN: 1971/05/29 (51 y.o. 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 05/15/2022 No Yes involvement without evidence of necrosis N18.6 End stage renal disease 05/15/2022 No Yes Z89.611 Acquired absence of right leg above knee 05/15/2022 No Yes Z89.432 Acquired absence of left foot 05/15/2022 No Yes I50.42 Chronic combined systolic (congestive) and diastolic (congestive) heart failure 05/15/2022 No Yes I73.9 Peripheral vascular disease, unspecified 05/15/2022 No Yes E11.52 Type 2 diabetes mellitus with diabetic peripheral angiopathy with gangrene 05/15/2022 No Yes L97.522 Non-pressure chronic ulcer of other part of left foot with fat layer exposed 06/20/2022 No Yes L97.325 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 06/20/2022 No Yes evidence of necrosis L97.822 Non-pressure chronic ulcer of other part of left lower leg with fat layer exposed11/09/2021 No Yes Inactive Problems Resolved Problems Electronic Signature(s) Signed: 07/05/2022 4:15:08 PM  By: Fredirick Maudlin MD FACS Previous Signature: 07/05/2022 4:14:40 PM Version By: Fredirick Maudlin MD FACS Entered By: Fredirick Maudlin on 07/05/2022 [place], [place] L ([id number]) [id number] Page 11 of 18 -------------------------------------------------------------------------------- Progress Note Details Patient Name: Date of Service: Edward [name] MES L. 07/05/2022 2:30 PM Medical Record Number: [id number] Patient Account Number: [id number] Date of Birth/Sex: Treating RN: May 04, 1971 (51 y.o. M) Primary Care Provider: [name] Other Clinician: Referring Provider: Treating Provider/Extender: Kevan Ny in Treatment: 7 Subjective Chief Complaint Information obtained from Patient Patients presents for treatment of an open diabetic ulcer History of Present Illness (HPI) Admission 09/24/2021 Mr. Jese Comella is a 51 year old male with a past medical history of foreign years gangrene, type 2 diabetes, end-stage renal disease on hemodialysis, right BKA, left transmetatarsal amputation that presents to the clinic for 3 wounds. He developed cellulitis to the left fourth finger and was treated with doxycycline at urgent care. The nail is coming off. He reports taking the antibiotics currently. He has been keeping the area covered. He also presents with 2 left lower extremity wounds. He reports that the more proximal wound has been present for several months and the more distal wound has developed more recently over the past several weeks. He had an abdominal aortogram to the left lower extremity with laser atherectomy to the left popliteal, left tibioperoneal trunk and left peroneal arteries, drug-coated balloon angioplasty of the left popliteal artery and plain balloon angioplasty of the left tibioperoneal trunk and left peroneal artery. He has not followed up with vein and vascular since his procedure on 04/09/2021. He currently  denies signs of infection. He currently denies pain. 11/28/2021: The patient has not been seen since his initial admission. He has not followed up with vascular surgery, either. He did undergo amputation of his left fourth finger and is currently under the care of orthopedics and occupational therapy for this. He presents today with multiple wounds on his bilateral lower extremities. He is very fluid overloaded, based upon the appearance of his extremities. These all seem to have started as blisters that have subsequently opened and caused  ulcers. He does have a shrinker sock for his right BKA, but he is unable to wear it secondary to pain. He also has a compression stocking for his left lower extremity but he has been unable to apply it due to the amputation on his left hand. The largest and most painful of these wounds is just above the knee on the right. All of the wounds have eschar and some slough present. No overt signs of infection. 12/05/2021: All of the wounds look about the same. He has new blisters that have opened up revealing denuded skin underneath. The eschar is extremely hard and where there is not eschar, the wounds are gritty. He was unable to tolerate the Ace wrap at the tightness that we applied it last week and ended up having to have it loosened. He has been tolerating Kerlix and Coban on the left leg. He has not received a follow-up appointment with vascular surgery yet. We have been using Santyl under Hydrofera Blue. 12/12/2021: We have been unable to secure home health care assistance. The patient and his son are doing his wound care at home. He does have a follow-up visit in vascular surgery on May 24. He appears to be less fluid overloaded today. The wound on his right medial thigh looks bigger today and has a thick layer of hard dark eschar. The wound on his lateral right stump looks somewhat improved with softer eschar and slough. There is also slough on the wound  overlying the end of the stump and wrapping around to the backside of it. On his left leg, the edema control is improved. The wound on his anterior tibial surface is small and clean. On his dorsal and medial ankle, there is slough buildup. 12/19/2021: The wound on his left anterior tibial surface is closed. The wound on his dorsal left foot looks about the same with just a bit of slough buildup. The surface is gritty. The wound on his right medial thigh looks about the same today and has reaccumulated the hard dark eschar, despite vigorous debridement last week. The wound on the right lateral stump is about the same and has less eschar and slough. Overlying the end of the stump and wrapping around the backside of it, the wounds have converged. Overall, his edema control is much better than it has been and certainly than it was on the first visit. 12/26/2021: The wound on his dorsal left foot has a bit of slough accumulation but the surface is less gritty. The wound on his left medial calf is clean with a good bit of granulation tissue. The wounds on his right thigh and stump continue to display a heavy thick layer of black eschar. 01/01/2022: The wound on his dorsal left foot is fairly clean but still has a gritty surface. The wound on his left medial calf is nearly closed with just a little bit of overlying eschar. Unfortunately, the wounds on his right thigh and stump have deteriorated. 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 He has a fluctuant area near the proximal part of his medial thigh wound. There is an odor coming from that leg but I am not sure which of the wounds is producing it or if both of them are. 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 He is  now back for further evaluation and  management of 3 small wounds on his left lower leg, just above the ankle on the anterior tibial surface. They are very fibrotic and dry. Muscle fibers are visible in the most proximal wound. There is slough and leathery eschar accumulation present. 06/20/2022: Mr. Borum returns to clinic after having been in the hospital for quite some time. He has returned with multiple new wounds. The largest of these is a nearly circumferential wound on the mid to lower portion of his left lower leg. He has wounds on his Achilles tendon area with muscle and tendon exposed. The anterior ankle wound has thick black eschar accumulated on the surface. The dorsal foot wound also has slough and eschar buildup. All of the wound surfaces are incredibly fibrotic and do not look particularly viable. 06/27/2022: All of the wounds are bigger this week, but most of them have an improved surface, with less fibrosis and even some pink coloration, particularly the circumferential leg wound. The anterior ankle wound has some tendon exposure underneath fibrinous slough in the proximal anterior leg wound remains fairly dry and tough. 07/05/2022: Cultures that I took were positive for Pseudomonas aeruginosa. I prescribed levofloxacin, but the patient did not fill or take it because he has been taking ciprofloxacin, prescribed by his orthopedic surgeon. All of the wound surfaces actually look better this week. There is a film that is stained yellow from the Iodoflex/Iodosorb. Still with extensive slough on all wound surfaces. He still has leathery eschar on his dorsal foot wound and the Achilles tendon is exposed and is quite desiccated. Patient History Information obtained from Patient, Chart. CALHOUN, REICHARDT ([id number]) [id number] Page 12 of 18 Family History Cancer - Mother, Diabetes - Siblings, Heart Disease - Mother,Father, Kidney Disease - Siblings,Father, No family history of Hereditary  Spherocytosis, Hypertension, Lung Disease, Seizures, Stroke, Thyroid Problems, Tuberculosis. Social History Former smoker - Quit 2 years ago, Marital Status - Widowed, Alcohol Use - Rarely, Drug Use - No History, Caffeine Use - Moderate - coffee. Medical History Eyes Patient has history of Cataracts - Surgery Denies history of Glaucoma, Optic Neuritis Hematologic/Lymphatic Patient has history of Anemia Respiratory Patient has history of Sleep Apnea Cardiovascular Patient has history of Arrhythmia - junctional rhythm, Congestive Heart Failure, Coronary Artery Disease, Hypertension, Myocardial Infarction, Peripheral Venous Disease Endocrine Patient has history of Type II Diabetes Denies history of Type I Diabetes Genitourinary Patient has history of End Stage Renal Disease - Dialysis Integumentary (Skin) Denies history of History of Burn Musculoskeletal Patient has history of Osteoarthritis, Osteomyelitis Denies history of Gout, Rheumatoid Arthritis Neurologic Patient has history of Neuropathy Oncologic Denies history of Received Chemotherapy, Received Radiation Psychiatric Patient has history of Confinement Anxiety Denies history of Anorexia/bulimia Hospitalization/Surgery History - amputation left index finger 11/15/21. - debridement and closure left ring finger. - peripheral vascular atherectomy 0/96/28. - left bascillic vein transposition 12/1 21. - revision of AV fistula left 05/24/20. - AV fistula placement left 11/25/19. - coronary stent intervention 12/11 19. - right BKA 06/19/18. - total left hip arthroplasty 10/11/16. - left transmet amp 05/10/16. - amputation above knee R 01/08/22. - amputauoin left index finger 01/12/22. - amputation right index finger. Medical A Surgical History Notes nd Cardiovascular cardiomyopathy Neurologic TIA Objective Constitutional No acute distress.. Vitals Time Taken: 2:55 PM, Temperature: 98.9 F, Pulse: 65 bpm, Respiratory Rate: 18  breaths/min, Blood Pressure: 122/61 mmHg. Respiratory Normal work of breathing on room air.. General Notes: 07/05/2022: All of the wound surfaces  actually look better this week. There is a film that is stained yellow from the Iodoflex/Iodosorb. Still with extensive slough on all wound surfaces. He still has leathery eschar on his dorsal foot wound and the Achilles tendon is exposed and is quite desiccated. His leg is also more edematous and quite warm. Integumentary (Hair, Skin) Wound #11 status is Open. Original cause of wound was Gradually Appeared. The date acquired was: 12/17/2021. The wound has been in treatment 7 weeks. The wound is located on the Left,Anterior Lower Leg. The wound measures 7.4cm length x 14cm width x 0.1cm depth; 81.367cm^2 area and 8.137cm^3 volume. There is muscle and Fat Layer (Subcutaneous Tissue) exposed. There is no tunneling or undermining noted. There is a medium amount of serosanguineous drainage noted. There is small (1-33%) pink granulation within the wound bed. There is a large (67-100%) amount of necrotic tissue within the wound bed including Eschar and Adherent Slough. The periwound skin appearance had no abnormalities noted for texture. The periwound skin appearance had no abnormalities noted for moisture. The periwound skin appearance had no abnormalities noted for color. Wound #12 status is Open. Original cause of wound was Gradually Appeared. The date acquired was: 06/15/2022. The wound has been in treatment 2 weeks. The wound is located on the Left Achilles. The wound measures 3.5cm length x 4.5cm width x 0.1cm depth; 12.37cm^2 area and 1.237cm^3 volume. There is Fat Layer (Subcutaneous Tissue) exposed. There is no tunneling or undermining noted. There is a medium amount of serosanguineous drainage noted. There is small (1-33%) pink granulation within the wound bed. There is a large (67-100%) amount of necrotic tissue within the wound bed including Eschar and  Adherent Slough. The periwound skin appearance exhibited: Pallor. Periwound temperature was noted as No Abnormality. Wound #13 status is Open. Original cause of wound was Gradually Appeared. The date acquired was: 06/18/2022. The wound has been in treatment 2 weeks. The wound is located on the Left,Circumferential Lower Leg. The wound measures 21cm length x 18.5cm width x 0.1cm depth; 305.127cm^2 area and 30.513cm^3 volume. There is Fat Layer (Subcutaneous Tissue) exposed. There is no tunneling or undermining noted. There is a medium amount of serosanguineous drainage noted. There is medium (34-66%) pink granulation within the wound bed. There is a medium (34-66%) amount of necrotic tissue within ELIAB, CLOSSON ([id number]) [id number] Page 13 of 18 the wound bed including Eschar and Adherent Slough. Wound #14 status is Open. Original cause of wound was Gradually Appeared. The date acquired was: 06/18/2022. The wound has been in treatment 2 weeks. The wound is located on the Left,Dorsal Foot. The wound measures 1.2cm length x 1.8cm width x 0.3cm depth; 1.696cm^2 area and 0.509cm^3 volume. There is Fat Layer (Subcutaneous Tissue) exposed. There is no tunneling or undermining noted. There is a medium amount of serosanguineous drainage noted. There is small (1-33%) pink granulation within the wound bed. There is a large (67-100%) amount of necrotic tissue within the wound bed including Eschar and Adherent Slough. The periwound skin appearance exhibited: Scarring, Maceration, Hemosiderin Staining. Periwound temperature was noted as No Abnormality. Wound #15 status is Open. Original cause of wound was Gradually Appeared. The date acquired was: 06/18/2022. The wound has been in treatment 2 weeks. The wound is located on the Left,Proximal,Anterior Lower Leg. The wound measures 0.5cm length x 0.4cm width x 0.1cm depth; 0.157cm^2 area and 0.016cm^3 volume. There is Fat Layer  (Subcutaneous Tissue) exposed. There is no tunneling or undermining noted. There is a medium amount of  serosanguineous drainage noted. There is large (67-100%) pink granulation within the wound bed. There is a small (1-33%) amount of necrotic tissue within the wound bed including Eschar and Adherent Slough. The periwound skin appearance had no abnormalities noted for moisture. The periwound skin appearance had no abnormalities noted for color. The periwound skin appearance exhibited: Scarring. Periwound temperature was noted as No Abnormality. Wound #16 status is Open. Original cause of wound was Gradually Appeared. The date acquired was: 06/18/2022. The wound has been in treatment 2 weeks. The wound is located on the Left,Distal,Medial Lower Leg. The wound measures 3cm length x 0.5cm width x 0.1cm depth; 1.178cm^2 area and 0.118cm^3 volume. There is Fat Layer (Subcutaneous Tissue) exposed. There is no tunneling or undermining noted. There is a medium amount of serosanguineous drainage noted. There is small (1-33%) pink granulation within the wound bed. There is a large (67-100%) amount of necrotic tissue within the wound bed including Eschar and Adherent Slough. The periwound skin appearance had no abnormalities noted for texture. The periwound skin appearance had no abnormalities noted for moisture. The periwound skin appearance had no abnormalities noted for color. Periwound temperature was noted as No Abnormality. 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 There was a Selective/Open Wound Non-Viable Tissue Debridement with a total area of 103.6 sq cm performed by Fredirick Maudlin, MD. With the following instrument(s): Curette to remove Non-Viable tissue/material. Material removed includes [facility] after achieving pain control using Lidocaine 5% topical ointment. No specimens were taken. A time out was conducted at 15:25, prior to the start of the procedure. A Minimum amount of bleeding was controlled with Pressure. The procedure was tolerated well with a pain level of 0 throughout and a pain level of 0 following the procedure. Post Debridement Measurements: 7.4cm length x 14cm width x 0.1cm depth; 8.137cm^3 volume. Character of Wound/Ulcer Post Debridement is improved. Severity of Tissue Post Debridement is: Fat layer exposed. Post procedure Diagnosis Wound #11: Same as Pre-Procedure General Notes: Scribed for Dr. Celine Ahr by Lenna Sciara.S.. Wound #12 Pre-procedure diagnosis of Wound #12 is a Diabetic Wound/Ulcer of the Lower Extremity located on the Left Achilles .Severity of Tissue Pre Debridement is: Fat layer exposed. There was a Selective/Open Wound Non-Viable Tissue Debridement with a total area of 15.75 sq cm performed by Fredirick Maudlin, MD. With the following instrument(s): Curette to remove Non-Viable tissue/material. Material removed includes [place], [place], and Other: T endon after achieving pain control using Lidocaine 5% topical ointment. No specimens were taken. A time out was conducted at 15:25, prior to the start of the procedure. A  Minimum amount of bleeding was controlled with Pressure. The procedure was tolerated well with a pain level of 0 throughout and a pain level of 0 following the procedure. Post Debridement Measurements: 3.5cm  length x 4.5cm width x 0.1cm depth; 1.237cm^3 volume. Character of Wound/Ulcer Post Debridement is improved. Severity of Tissue Post Debridement is: Fat layer exposed. Post procedure Diagnosis Wound #12: Same as Pre-Procedure General Notes: Scribed for Dr. Celine Ahr by Lenna Sciara.S.. Wound #13 Pre-procedure diagnosis of Wound #13 is a Diabetic Wound/Ulcer of the Lower Extremity located on the Left,Circumferential Lower Leg .Severity of Tissue Pre Debridement is: Fat layer exposed. There was a Selective/Open Wound Non-Viable Tissue Debridement with a total area of 388.5 sq cm performed by Fredirick Maudlin, MD. With the following instrument(s): Curette to remove Non-Viable tissue/material. Material removed includes [facility] after achieving pain control using Lidocaine 5% topical ointment. No specimens were taken. A time out was conducted at 15:25, prior to the start of the procedure. A Minimum amount of bleeding was controlled with Pressure. The procedure was tolerated well with a pain level of 0 throughout and a pain level of 0 following the procedure. Post Debridement Measurements: 21cm length x 18.5cm width x 0.1cm depth; 30.513cm^3 volume. Character of Wound/Ulcer Post Debridement is improved. Severity of Tissue Post Debridement is: Fat layer exposed. Post procedure Diagnosis Wound #13: Same as Pre-Procedure Wound #14 Pre-procedure diagnosis of Wound #14 is a Diabetic Wound/Ulcer of the Lower Extremity located on the Left,Dorsal Foot .Severity of Tissue Pre Debridement is: Fat layer exposed. There was a Selective/Open Wound Non-Viable Tissue Debridement with a total area of 2.16 sq cm performed by Fredirick Maudlin, MD. With the following instrument(s): Curette to remove Non-Viable tissue/material. Material  removed includes Eschar and Slough and after achieving pain control using Lidocaine 5% topical ointment. No specimens were taken. A time out was conducted at 15:25, prior to the start of the procedure. A Minimum amount of bleeding was controlled with Pressure. The procedure was tolerated well with a pain level of 0 throughout and a pain level of 0 following the procedure. Post Debridement Measurements: 1.2cm length x 1.8cm width x 0.1cm depth; 0.17cm^3 volume. Character of Wound/Ulcer Post Debridement is improved. Severity of Tissue Post Debridement is: Fat layer exposed. Post procedure Diagnosis Wound #14: Same as Pre-Procedure GAETAN, SPIEKER ([id number]) [id number] Page 14 of 18 General Notes: Scribed for Dr. Celine Ahr by Lenna Sciara.S.. Wound #15 Pre-procedure diagnosis of Wound #15 is a Diabetic Wound/Ulcer of the Lower Extremity located on the Left,Proximal,Anterior Lower Leg .Severity of Tissue Pre Debridement is: Fat layer exposed. There was a Selective/Open Wound Non-Viable Tissue Debridement with a total area of 0.2 sq cm performed by Fredirick Maudlin, MD. With the following instrument(s): Curette to remove Non-Viable tissue/material. Material removed includes [facility] after achieving pain control using Lidocaine 5% topical ointment. No specimens were taken. A time out was conducted at 15:25, prior to the start of the procedure. A Minimum amount of bleeding was controlled with Pressure. The procedure was tolerated well with a pain level of 0 throughout and a pain level of 0 following the procedure. Post Debridement Measurements: 0.5cm length x 0.4cm width x 0.1cm depth; 0.016cm^3 volume. Character of Wound/Ulcer Post Debridement is improved. Severity of Tissue Post Debridement is: Fat layer exposed. Post procedure Diagnosis Wound #15: Same as Pre-Procedure General Notes: Scribed for Dr. Celine Ahr by J.Scotton. Wound #16 Pre-procedure diagnosis of Wound #16 is a Diabetic  Wound/Ulcer of the Lower Extremity located on the Left,Distal,Medial Lower Leg .Severity of Tissue Pre Debridement is: Fat layer exposed. There was a Selective/Open Wound Non-Viable Tissue Debridement with a total area of 1.5 sq cm performed by Fredirick Maudlin, MD. With the following instrument(s): Curette  to remove Non-Viable tissue/material. Material removed includes Eschar and Slough and after achieving pain control using Lidocaine 5% topical ointment. No specimens were taken. A time out was conducted at 15:25, prior to the start of the procedure. A Minimum amount of bleeding was controlled with Pressure. The procedure was tolerated well with a pain level of 0 throughout and a pain level of 0 following the procedure. Post Debridement Measurements: 3cm length x 0.5cm width x 0.1cm depth; 0.118cm^3 volume. Character of Wound/Ulcer Post Debridement is improved. Severity of Tissue Post Debridement is: Fat layer exposed. Post procedure Diagnosis Wound #16: Same as Pre-Procedure General Notes: Scribed for Dr. Celine Ahr by J.Scotton. Plan Follow-up Appointments: Return Appointment in 2 weeks. - Dr. Celine Ahr Room 3 Anesthetic: Wound #11 Left,Anterior Lower Leg: (In clinic) Topical Lidocaine 5% applied to wound bed Bathing/ Shower/ Hygiene: May shower and wash wound with soap and water. - use antibacterial soap to wash legs and wounds when changing dressing. right leg Edema Control - Lymphedema / SCD / Other: Elevate legs to the level of the heart or above for 30 minutes daily and/or when sitting, a frequency of: - throughout the day Avoid standing for long periods of time. Additional Orders / Instructions: Follow Nutritious Diet - Monitor/Control Blood Sugars WOUND #11: - Lower Leg Wound Laterality: Left, Anterior Cleanser: Soap and Water Every Other Day/30 Days Discharge Instructions: May shower and wash wound with dial antibacterial soap and water prior to dressing change. Peri-Wound Care: Sween  Lotion (Moisturizing lotion) Every Other Day/30 Days Discharge Instructions: Apply moisturizing lotion as directed Prim Dressing: IODOFLEX 0.9% Cadexomer Iodine Pad 4x6 cm (Generic) Every Other Day/30 Days ary Discharge Instructions: Use Iodosorb if Iodoflex not available Secondary Dressing: ABD Pad, 5x9 (Generic) Every Other Day/30 Days Discharge Instructions: Apply over primary dressing as directed. Secondary Dressing: Woven Gauze Sponge, Non-Sterile 4x4 in (Generic) Every Other Day/30 Days Discharge Instructions: Apply over primary dressing as directed. Secured With: [employer], 4.5x3.1 (in/yd) (Generic) Every Other Day/30 Days Discharge Instructions: Secure with Kerlix as directed. Secured With: 45M Medipore H Soft Cloth Surgical T ape, 4 x 10 (in/yd) (Generic) Every Other Day/30 Days Discharge Instructions: Secure with tape as directed. WOUND #12: - Achilles Wound Laterality: Left Cleanser: Soap and Water Every Other Day/30 Days Discharge Instructions: May shower and wash wound with dial antibacterial soap and water prior to dressing change. Peri-Wound Care: Sween Lotion (Moisturizing lotion) Every Other Day/30 Days Discharge Instructions: Apply moisturizing lotion as directed Prim Dressing: IODOFLEX 0.9% Cadexomer Iodine Pad 4x6 cm (Generic) Every Other Day/30 Days ary Discharge Instructions: Use Iodosorb if Iodoflex not available Secondary Dressing: ABD Pad, 5x9 (Generic) Every Other Day/30 Days Discharge Instructions: Apply over primary dressing as directed. Secondary Dressing: Woven Gauze Sponge, Non-Sterile 4x4 in (Generic) Every Other Day/30 Days Discharge Instructions: Apply over primary dressing as directed. Secured With: [employer], 4.5x3.1 (in/yd) (Generic) Every Other Day/30 Days Discharge Instructions: Secure with Kerlix as directed. Secured With: 45M Medipore H Soft Cloth Surgical T ape, 4 x 10 (in/yd) (Generic) Every Other Day/30 Days Discharge  Instructions: Secure with tape as directed. WOUND #13: - Lower Leg Wound Laterality: Left, Circumferential Cleanser: Soap and Water Every Other Day/30 Days Discharge Instructions: May shower and wash wound with dial antibacterial soap and water prior to dressing change. Peri-Wound Care: Sween Lotion (Moisturizing lotion) Every Other Day/30 Days Discharge Instructions: Apply moisturizing lotion as directed Prim Dressing: IODOFLEX 0.9% Cadexomer Iodine Pad 4x6 cm (Generic) Every Other Day/30 Days ary Discharge  Instructions: Use Iodosorb if Iodoflex not available Secondary Dressing: ABD Pad, 5x9 (Generic) Every Other Day/30 Days Discharge Instructions: Apply over primary dressing as directed. Secondary Dressing: Woven Gauze Sponge, Non-Sterile 4x4 in (Generic) Every Other Day/30 Days Discharge Instructions: Apply over primary dressing as directed. Secured With: [employer], 4.5x3.1 (in/yd) (Generic) Every Other Day/30 Days Discharge Instructions: Secure with Kerlix as directed. Secured With: 61M Medipore H Soft Cloth Surgical T ape, 4 x 10 (in/yd) (Generic) Every Other Day/30 Days Discharge Instructions: Secure with tape as directed. WOUND #14: - Foot Wound Laterality: Dorsal, Left Cleanser: Soap and Water Every Other Day/30 Days Discharge Instructions: May shower and wash wound with dial antibacterial soap and water prior to dressing change. Peri-Wound Care: Sween Lotion (Moisturizing lotion) Every Other Day/30 Days DEARL, RUDDEN ([id number]) [id number] Page 15 of 18 Discharge Instructions: Apply moisturizing lotion as directed Prim Dressing: IODOFLEX 0.9% Cadexomer Iodine Pad 4x6 cm (Generic) Every Other Day/30 Days ary Discharge Instructions: Use Iodosorb if Iodoflex not available Secondary Dressing: ABD Pad, 5x9 (Generic) Every Other Day/30 Days Discharge Instructions: Apply over primary dressing as directed. Secondary Dressing: Woven Gauze Sponge,  Non-Sterile 4x4 in (Generic) Every Other Day/30 Days Discharge Instructions: Apply over primary dressing as directed. Secured With: [employer], 4.5x3.1 (in/yd) (Generic) Every Other Day/30 Days Discharge Instructions: Secure with Kerlix as directed. Secured With: 61M Medipore H Soft Cloth Surgical T ape, 4 x 10 (in/yd) (Generic) Every Other Day/30 Days Discharge Instructions: Secure with tape as directed. WOUND #15: - Lower Leg Wound Laterality: Left, Anterior, Proximal Cleanser: Soap and Water Every Other Day/30 Days Discharge Instructions: May shower and wash wound with dial antibacterial soap and water prior to dressing change. Peri-Wound Care: Sween Lotion (Moisturizing lotion) Every Other Day/30 Days Discharge Instructions: Apply moisturizing lotion as directed Prim Dressing: IODOFLEX 0.9% Cadexomer Iodine Pad 4x6 cm (Generic) Every Other Day/30 Days ary Discharge Instructions: Use Iodosorb if Iodoflex not available Secondary Dressing: ABD Pad, 5x9 (Generic) Every Other Day/30 Days Discharge Instructions: Apply over primary dressing as directed. Secondary Dressing: Woven Gauze Sponge, Non-Sterile 4x4 in (Generic) Every Other Day/30 Days Discharge Instructions: Apply over primary dressing as directed. Secured With: [employer], 4.5x3.1 (in/yd) (Generic) Every Other Day/30 Days Discharge Instructions: Secure with Kerlix as directed. Secured With: 61M Medipore H Soft Cloth Surgical T ape, 4 x 10 (in/yd) (Generic) Every Other Day/30 Days Discharge Instructions: Secure with tape as directed. WOUND #16: - Lower Leg Wound Laterality: Left, Medial, Distal Cleanser: Soap and Water Every Other Day/30 Days Discharge Instructions: May shower and wash wound with dial antibacterial soap and water prior to dressing change. Peri-Wound Care: Sween Lotion (Moisturizing lotion) Every Other Day/30 Days Discharge Instructions: Apply moisturizing lotion as directed Prim Dressing: IODOFLEX  0.9% Cadexomer Iodine Pad 4x6 cm (Generic) Every Other Day/30 Days ary Discharge Instructions: Use Iodosorb if Iodoflex not available Secondary Dressing: ABD Pad, 5x9 (Generic) Every Other Day/30 Days Discharge Instructions: Apply over primary dressing as directed. Secondary Dressing: Woven Gauze Sponge, Non-Sterile 4x4 in (Generic) Every Other Day/30 Days Discharge Instructions: Apply over primary dressing as directed. Secured With: [employer], 4.5x3.1 (in/yd) (Generic) Every Other Day/30 Days Discharge Instructions: Secure with Kerlix as directed. Secured With: 61M Medipore H Soft Cloth Surgical T ape, 4 x 10 (in/yd) (Generic) Every Other Day/30 Days Discharge Instructions: Secure with tape as directed. 07/05/2022: All of the wound surfaces actually look better this week. There is a film that is stained yellow from the Iodoflex/Iodosorb. Still  with extensive slough on all wound surfaces. He still has leathery eschar on his dorsal foot wound and the Achilles tendon is exposed and is quite desiccated. His leg is also more edematous and quite warm. I used a curette to debride slough off of the extensive wounds covering the majority of his lower leg. I also debrided eschar and slough off of his dorsal foot wound, slough and nonviable subcutaneous tissue off of the anterior and posterior ankle wounds. We will continue to use Iodoflex to all of the wounds. He should be getting adequate Pseudomonas coverage from the ciprofloxacin, but his leg is quite warm and swollen. I am not sure if the levofloxacin will make any difference, but I asked them to switch to this at least for the course that I prescribed. I also asked them to monitor his leg closely. If it becomes any more red, warm, or swollen, he may need admission to the hospital for IV antibiotic therapy. Due to the Thanksgiving holiday, they will follow-up in 2 weeks. Electronic Signature(s) Signed: 07/05/2022 4:23:02 PM By: Fredirick Maudlin MD FACS Entered By: Fredirick Maudlin on 07/05/2022 16:23:02 -------------------------------------------------------------------------------- HxROS Details Patient Name: Date of Service: Edward [name], [name] 07/05/2022 2:30 PM Medical Record Number: [id number] Patient Account Number: [id number] Date of Birth/Sex: Treating RN: 02/08/1971 (51 y.o. M) Primary Care Provider: [name] Other Clinician: Referring Provider: Treating Provider/Extender: Kevan Ny in Treatment: 7 Information Obtained From Patient Chart Eyes Medical History: Positive for: Cataracts - Surgery ADETOKUNBO, MCCADDEN ([id number]) [id number] Page 16 of 18 Negative for: Glaucoma; Optic Neuritis Hematologic/Lymphatic Medical History: Positive for: Anemia Respiratory Medical History: Positive for: Sleep Apnea Cardiovascular Medical History: Positive for: Arrhythmia - junctional rhythm; Congestive Heart Failure; Coronary Artery Disease; Hypertension; Myocardial Infarction; Peripheral Venous Disease Past Medical History Notes: cardiomyopathy Endocrine Medical History: Positive for: Type II Diabetes Negative for: Type I Diabetes Time with diabetes: 2004 Treated with: Insulin Blood sugar tested every day: Yes Tested : daily Genitourinary Medical History: Positive for: End Stage Renal Disease - Dialysis Integumentary (Skin) Medical History: Negative for: History of Burn Musculoskeletal Medical History: Positive for: Osteoarthritis; Osteomyelitis Negative for: Gout; Rheumatoid Arthritis Neurologic Medical History: Positive for: Neuropathy Past Medical History Notes: TIA Oncologic Medical History: Negative for: Received Chemotherapy; Received Radiation Psychiatric Medical History: Positive for: Confinement Anxiety Negative for: Anorexia/bulimia HBO Extended History Items Eyes: Cataracts Immunizations Pneumococcal Vaccine: Received  Pneumococcal Vaccination: Yes Received Pneumococcal Vaccination On or After 60th Birthday: No Implantable Devices Yes Hospitalization / Surgery History AIKAM, VINJE ([id number]) [id number] Page 17 of 18 Type of Hospitalization/Surgery amputation left index finger 11/15/21 debridement and closure left ring finger peripheral vascular atherectomy 3/70/48 left bascillic vein transposition 12/1 21 revision of AV fistula left 05/24/20 AV fistula placement left 11/25/19 coronary stent intervention 12/11 19 right BKA 06/19/18 total left hip arthroplasty 10/11/16 left transmet amp 05/10/16 amputation above knee R 01/08/22 amputauoin left index finger 01/12/22 amputation right index finger Family and Social History Cancer: Yes - Mother; Diabetes: Yes - Siblings; Heart Disease: Yes - Mother,Father; Hereditary Spherocytosis: No; Hypertension: No; Kidney Disease: Yes - Siblings,Father; Lung Disease: No; Seizures: No; Stroke: No; Thyroid Problems: No; Tuberculosis: No; Former smoker - Quit 2 years ago; Marital Status - Widowed; Alcohol Use: Rarely; Drug Use: No History; Caffeine Use: Moderate - coffee; Financial Concerns: No; Food, Clothing or Shelter Needs: No; Support System Lacking: No; Transportation Concerns: No Engineer, maintenance) Signed: 07/05/2022 5:13:10 PM By: Fredirick Maudlin MD FACS  Entered By: Fredirick Maudlin on 07/05/2022 16:17:36 -------------------------------------------------------------------------------- SuperBill Details Patient Name: Date of Service: Edward [name] MES L. 07/05/2022 Medical Record Number: [id number] Patient Account Number: [id number] Date of Birth/Sex: Treating RN: [birth year] (51 y.o. 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 involvement without evidence of necrosis N18.6 End stage renal disease Z89.611 Acquired absence of right leg above knee Z89.432 Acquired absence of left foot I50.42 Chronic combined systolic (congestive) and diastolic (congestive) heart failure I73.9 Peripheral vascular disease, unspecified E11.52 Type 2 diabetes mellitus with diabetic peripheral angiopathy with gangrene L97.522 Non-pressure chronic ulcer of other part of left foot with fat layer exposed L97.325 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 L97.822 Non-pressure chronic ulcer of other part of left lower leg with fat layer exposed Facility Procedures : CPT4 Code Description: [id number] - DEBRIDE WOUND 1ST 20 SQ CM OR < ICD-10 Diagnosis Description L97.825 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 involvemen L97.522 Non-pressure chronic ulcer of other part of left foot  with fat layer exposed L97.325 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 L97.822 Non-pressure chronic ulcer of other part of left lower leg with fat layer exposed Modifier: t without evidence o of necrosis Quantity: 1 f necrosis : ADANTE, COURINGTON Code Description: [id number] - DEBRIDE WOUND EA ADDL 20 SQ CM ICD-10 Diagnosis Description L97.825 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 L97.522 Non-pressure chronic ulcer of other part of left foot  with fat layer exposed L97.325 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 o AMES L ([id number]) [id number] L97.822 Non-pressure chronic ulcer of other part of left lower leg with fat layer exposed Modifier: without evidence of f necrosis [id number] P Quantity: 25 necrosis age 57 of 53 Physician Procedures : CPT4 Code Description Modifier [id number] - WC PHYS  LEVEL 4 - EST PT 25 ICD-10 Diagnosis Description L97.825 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 without evidence of L97.522 Non-pressure chronic ulcer of  other part of left foot with fat layer exposed L97.325 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 L97.822 Non-pressure chronic ulcer of other part of left lower leg with fat layer exposed Quantity: 1 necrosis : [id number] - WC PHYS DEBR WO ANESTH 20 SQ CM ICD-10 Diagnosis Description L97.825 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 without evidence of L97.522 Non-pressure chronic ulcer of other part of left foot  with fat layer exposed L97.325 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 L97.822 Non-pressure chronic ulcer of other part of left lower leg with fat layer exposed Quantity: 1 necrosis : [id number] - WC PHYS DEBR WO ANESTH EA ADD 20 CM ICD-10 Diagnosis Description L97.825 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 without evidence of L97.522 Non-pressure chronic ulcer of other part of left  foot with fat layer exposed L97.325 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

## 2022-07-08 DIAGNOSIS — E1129 Type 2 diabetes mellitus with other diabetic kidney complication: Secondary | ICD-10-CM | POA: Diagnosis not present

## 2022-07-08 DIAGNOSIS — N2581 Secondary hyperparathyroidism of renal origin: Secondary | ICD-10-CM | POA: Diagnosis not present

## 2022-07-08 DIAGNOSIS — S68119D Complete traumatic metacarpophalangeal amputation of unspecified finger, subsequent encounter: Secondary | ICD-10-CM | POA: Diagnosis not present

## 2022-07-08 DIAGNOSIS — Z992 Dependence on renal dialysis: Secondary | ICD-10-CM | POA: Diagnosis not present

## 2022-07-08 DIAGNOSIS — D689 Coagulation defect, unspecified: Secondary | ICD-10-CM | POA: Diagnosis not present

## 2022-07-08 DIAGNOSIS — T8249XD Other complication of vascular dialysis catheter, subsequent encounter: Secondary | ICD-10-CM | POA: Diagnosis not present

## 2022-07-08 DIAGNOSIS — R52 Pain, unspecified: Secondary | ICD-10-CM | POA: Diagnosis not present

## 2022-07-08 DIAGNOSIS — I998 Other disorder of circulatory system: Secondary | ICD-10-CM | POA: Diagnosis not present

## 2022-07-08 DIAGNOSIS — L299 Pruritus, unspecified: Secondary | ICD-10-CM | POA: Diagnosis not present

## 2022-07-08 DIAGNOSIS — D631 Anemia in chronic kidney disease: Secondary | ICD-10-CM | POA: Diagnosis not present

## 2022-07-08 DIAGNOSIS — N186 End stage renal disease: Secondary | ICD-10-CM | POA: Diagnosis not present

## 2022-07-08 DIAGNOSIS — T148XXA Other injury of unspecified body region, initial encounter: Secondary | ICD-10-CM | POA: Diagnosis not present

## 2022-07-08 NOTE — Progress Notes (Signed)
PIPER, ALBRO ([id number]) [id number] Page 1 of 15 Visit Report for 07/05/2022 Arrival Information Details Patient Name: Date of Service: Edward [name] MES L. 07/05/2022 2:30 PM Medical Record Number: [id number] Patient Account Number: [id number] Date of Birth/Sex: Treating RN: 27-Dec-1970 (51 y.o. Waldron Session Primary Care Ravynn Hogate: [name] Other Clinician: Referring Latasia Silberstein: Treating Delmos Velaquez/Extender: Kevan Ny in Treatment: 7 Visit Information History Since Last Visit Added or deleted any medications: No Patient Arrived: Wheel Chair Any new allergies or adverse reactions: No Arrival Time: 14:55 Had a fall or experienced change in No Accompanied By: family activities of daily living that may affect Transfer Assistance: EasyPivot Patient Lift risk of falls: Patient Requires Transmission-Based Precautions: No Signs or symptoms of abuse/neglect since last visito No Patient Has Alerts: Yes Hospitalized since last visit: No Patient Alerts: Patient on Blood Thinner Implantable device outside of the clinic excluding No plavix cellular tissue based products placed in the center since last visit: Has Compression in Place as Prescribed: Yes Pain Present Now: Yes Electronic Signature(s) Signed: 07/08/2022 4:43:27 PM By: Blanche East RN Entered By: Blanche East on 07/05/2022 14:55:39 -------------------------------------------------------------------------------- Encounter Discharge Information Details Patient Name: Date of Service: Edward [name], Edward [name] MES L. 07/05/2022 2:30 PM Medical Record Number: [id number] Patient Account Number: [id number] Date of Birth/Sex: Treating RN: 1971-02-13 (51 y.o. [name] Primary Care Santina Trillo: [name] Other Clinician: Referring Jalexa Pifer: Treating Joedy Eickhoff/Extender: Kevan Ny in Treatment: 7 Encounter Discharge Information Items Post Procedure  Vitals Discharge Condition: Stable Temperature (F): 98.9 Ambulatory Status: Wheelchair Pulse (bpm): 65 Discharge Destination: Home Respiratory Rate (breaths/min): 18 Transportation: Private Auto Blood Pressure (mmHg): 122/61 Accompanied By: son Schedule Follow-up Appointment: Yes Clinical Summary of Care: Patient Declined Electronic Signature(s) Signed: 07/05/2022 4:59:08 PM By: Dellie Catholic RN Entered By: Dellie Catholic on 07/05/2022 16:58:13 [name] ([id number]) [id number] Page 2 of 15 -------------------------------------------------------------------------------- Lower Extremity Assessment Details Patient Name: Date of Service: Edward [name] MES L. 07/05/2022 2:30 PM Medical Record Number: [id number] Patient Account Number: [id number] Date of Birth/Sex: Treating RN: 07/31/1971 (51 y.o. Waldron Session Primary Care Sherrelle Prochazka: [name] Other Clinician: Referring Reena Borromeo: Treating Shamecka Hocutt/Extender: Cecile Hearing Weeks in Treatment: 7 Edema Assessment Assessed: [Left: No] [Right: No] [Left: Edema] [Right: :] Calf Left: Right: Point of Measurement: From Medial Instep 44.6 cm Ankle Left: Right: Point of Measurement: From Medial Instep 24.7 cm Vascular Assessment Pulses: Dorsalis Pedis Palpable: [Left:Yes] Electronic Signature(s) Signed: 07/08/2022 4:43:27 PM By: Blanche East RN Entered By: Blanche East on 07/05/2022 14:58:26 -------------------------------------------------------------------------------- Multi Wound Chart Details Patient Name: Date of Service: Edward [name], Edward [name] MES L. 07/05/2022 2:30 PM Medical Record Number: [id number] Patient Account Number: [id number] Date of Birth/Sex: Treating RN: [birth year] (51 y.o. M) Primary Care Terika Pillard: [name] Other Clinician: Referring Jaxyn Rout: Treating Kesia Dalto/Extender: Kevan Ny in Treatment: 7 Vital  Signs Height(in): Pulse(bpm): 65 Weight(lbs): Blood Pressure(mmHg): 122/61 Body Mass Index(BMI): Temperature(F): 98.9 Respiratory Rate(breaths/min): 18 [11:Photos:] [[id number] Page 3 of 15] Left, Anterior Lower Leg Left Achilles Left, Circumferential Lower Leg Wound Location: Gradually Appeared Gradually Appeared Gradually Appeared Wounding Event: Diabetic Wound/Ulcer of the Lower Diabetic Wound/Ulcer of the Lower Diabetic Wound/Ulcer of the Lower Primary Etiology: Extremity Extremity Extremity Cataracts, Anemia, Sleep Apnea, Cataracts, Anemia, Sleep Apnea, Cataracts, Anemia, Sleep Apnea, Comorbid History: Arrhythmia, Congestive Heart Failure, Arrhythmia, Congestive Heart Failure, Arrhythmia, Congestive Heart Failure, Coronary Artery Disease, Coronary Artery Disease, Coronary Artery Disease, Hypertension,  Myocardial Infarction, Hypertension, Myocardial Infarction, Hypertension, Myocardial Infarction, Peripheral Venous Disease, Type II Peripheral Venous Disease, Type II Peripheral Venous Disease, Type II Diabetes, End Stage Renal Disease, Diabetes, End Stage Renal Disease, Diabetes, End Stage Renal Disease, Osteoarthritis, Osteomyelitis, Osteoarthritis, Osteomyelitis, Osteoarthritis, Osteomyelitis, Neuropathy, Confinement Anxiety Neuropathy, Confinement Anxiety Neuropathy, Confinement Anxiety 12/17/2021 06/15/2022 06/18/2022 Date Acquired: '7 2 2 '$ Weeks of Treatment: Open Open Open Wound Status: No No No Wound Recurrence: No No No Clustered Wound: 7.4x14x0.1 3.5x4.5x0.1 21x18.5x0.1 Measurements L x W x D (cm) 81.367 12.37 305.127 A (cm) : rea 8.137 1.237 30.513 Volume (cm) : -634.80% -138.60% -31.20% % Reduction in A rea: -635.00% -138.80% -31.20% % Reduction in Volume: Grade 2 Unable to visualize wound bed Grade 2 Classification: Medium Medium Medium Exudate A mount: Serosanguineous Serosanguineous Serosanguineous Exudate Type: red,  brown red, brown red, brown Exudate Color: Small (1-33%) Small (1-33%) Medium (34-66%) Granulation A mount: Pink Pink Pink Granulation Quality: Large (67-100%) Large (67-100%) Medium (34-66%) Necrotic A mount: Eschar, Adherent Slough Eschar, Adherent Slough Eschar, Adherent Slough Necrotic Tissue: Fat Layer (Subcutaneous Tissue): Yes Fat Layer (Subcutaneous Tissue): Yes Fat Layer (Subcutaneous Tissue): Yes Exposed Structures: Muscle: Yes Fascia: No Fascia: No Fascia: No Tendon: No Tendon: No Tendon: No Muscle: No Muscle: No Joint: No Joint: No Joint: No Bone: No Bone: No Bone: No None None None Epithelialization: Debridement - Selective/Open Wound Debridement - Selective/Open Wound Debridement - Selective/Open Wound Debridement: Pre-procedure Verification/Time Out 15:25 15:25 15:25 Taken: Lidocaine 5% topical ointment Lidocaine 5% topical ointment Lidocaine 5% topical ointment Pain Control: Psychologist, prison and probation services, Other, [employer] Tissue Debrided: Non-Viable Tissue Non-Viable Tissue Non-Viable Tissue Level: 103.6 15.75 388.5 Debridement A (sq cm): rea Curette Curette Curette Instrument: Minimum Minimum Minimum Bleeding: Pressure Pressure Pressure Hemostasis A chieved: 0 0 0 Procedural Pain: 0 0 0 Post Procedural Pain: Procedure was tolerated well Procedure was tolerated well Procedure was tolerated well Debridement Treatment Response: 7.4x14x0.1 3.5x4.5x0.1 21x18.5x0.1 Post Debridement Measurements L x W x D (cm) 8.137 1.237 30.513 Post Debridement Volume: (cm) Excoriation: No Periwound Skin Texture: Induration: No Callus: No Crepitus: No Rash: No Scarring: No Maceration: No Periwound Skin Moisture: Dry/Scaly: No Atrophie Blanche: No Pallor: Yes Periwound Skin Color: Cyanosis: No Ecchymosis: No Erythema: No Hemosiderin Staining: No Mottled: No Pallor: No Rubor: No N/A No Abnormality N/A Temperature: Debridement Debridement  Debridement Procedures Performed: Wound Number: '[id number] 15 16 '$ Photos: Left, Dorsal Foot Left, Proximal, Anterior Lower Leg Left, Distal, Medial Lower Leg Wound Location: Gradually Appeared Gradually Appeared Gradually Appeared Wounding Event: Diabetic Wound/Ulcer of the Lower Diabetic Wound/Ulcer of the Lower Diabetic Wound/Ulcer of the Lower Primary Etiology: LARS, JEZIORSKI ([id number]) [id number] Page 4 of 15 Extremity Extremity Extremity Cataracts, Anemia, Sleep Apnea, Cataracts, Anemia, Sleep Apnea, Cataracts, Anemia, Sleep Apnea, Comorbid History: Arrhythmia, Congestive Heart Failure, Arrhythmia, Congestive Heart Failure, Arrhythmia, Congestive Heart Failure, Coronary Artery Disease, Coronary Artery Disease, Coronary Artery Disease, Hypertension, Myocardial Infarction, Hypertension, Myocardial Infarction, Hypertension, Myocardial Infarction, Peripheral Venous Disease, Type II Peripheral Venous Disease, Type II Peripheral Venous Disease, Type II Diabetes, End Stage Renal Disease, Diabetes, End Stage Renal Disease, Diabetes, End Stage Renal Disease, Osteoarthritis, Osteomyelitis, Osteoarthritis, Osteomyelitis, Osteoarthritis, Osteomyelitis, Neuropathy, Confinement Anxiety Neuropathy, Confinement Anxiety Neuropathy, Confinement Anxiety 06/18/2022 06/18/2022 06/18/2022 Date Acquired: '2 2 2 '$ Weeks of Treatment: Open Open Open Wound Status: No No No Wound Recurrence: No No Yes Clustered Wound: 1.2x1.8x0.3 0.5x0.4x0.1 3x0.5x0.1 Measurements L x W x D (cm) 1.696 0.157 1.178 A (cm) : rea 0.509 0.016 0.118 Volume (cm) : -63.50%  93.30% 50.00% % Reduction in A rea: -145.90% 93.20% 50.00% % Reduction in Volume: Grade 2 Grade 2 Grade 2 Classification: Medium Medium Medium Exudate A mount: Serosanguineous Serosanguineous Serosanguineous Exudate Type: red, brown red, brown red, brown Exudate Color: Small (1-33%) Large (67-100%) Small (1-33%) Granulation  A mount: Pink Pink Pink Granulation Quality: Large (67-100%) Small (1-33%) Large (67-100%) Necrotic A mount: Eschar, Adherent Slough Eschar, Adherent Slough Eschar, Adherent Slough Necrotic Tissue: Fat Layer (Subcutaneous Tissue): Yes Fat Layer (Subcutaneous Tissue): Yes Fat Layer (Subcutaneous Tissue): Yes Exposed Structures: Fascia: No Fascia: No Fascia: No Tendon: No Tendon: No Tendon: No Muscle: No Muscle: No Muscle: No Joint: No Joint: No Joint: No Bone: No Bone: No Bone: No None N/A N/A Epithelialization: Debridement - Selective/Open Wound Debridement - Selective/Open Wound Debridement - Selective/Open Wound Debridement: Pre-procedure Verification/Time Out 15:25 15:25 15:25 Taken: Lidocaine 5% topical ointment Lidocaine 5% topical ointment Lidocaine 5% topical ointment Pain Control: Necrotic/Eschar, [employer], [employer] Tissue Debrided: Non-Viable Tissue Non-Viable Tissue Non-Viable Tissue Level: 2.16 0.2 1.5 Debridement A (sq cm): rea Curette Curette Curette Instrument: Minimum Minimum Minimum Bleeding: Pressure Pressure Pressure Hemostasis A chieved: 0 0 0 Procedural Pain: 0 0 0 Post Procedural Pain: Procedure was tolerated well Procedure was tolerated well Procedure was tolerated well Debridement Treatment Response: 1.2x1.8x0.1 0.5x0.4x0.1 3x0.5x0.1 Post Debridement Measurements L x W x D (cm) 0.17 0.016 0.118 Post Debridement Volume: (cm) Scarring: Yes Scarring: Yes No Abnormalities Noted Periwound Skin Texture: Maceration: Yes No Abnormalities Noted No Abnormalities Noted Periwound Skin Moisture: Hemosiderin Staining: Yes No Abnormalities Noted No Abnormalities Noted Periwound Skin Color: No Abnormality No Abnormality No Abnormality Temperature: Debridement Debridement Debridement Procedures Performed: Treatment Notes Electronic Signature(s) Signed: 07/05/2022 4:15:15 PM By: Fredirick Maudlin MD FACS Entered By: Fredirick Maudlin on 07/05/2022 16:15:15 -------------------------------------------------------------------------------- Multi-Disciplinary Care Plan Details Patient Name: Date of Service: Edward [name], Edward [name] MES L. 07/05/2022 2:30 PM Medical Record Number: [id number] Patient Account Number: [id number] Date of Birth/Sex: Treating RN: April 08, 1971 (51 y.o. [name] Primary Care Jatavius Ellenwood: [name] Other Clinician: Referring Charlena Haub: Treating Lindalou Soltis/Extender: Kevan Ny in Treatment: [address] ([id number]) [id number] Page 5 of 15 Active Inactive Wound/Skin Impairment Nursing Diagnoses: Impaired tissue integrity Knowledge deficit related to ulceration/compromised skin integrity Goals: Patient/caregiver will verbalize understanding of skin care regimen Date Initiated: 05/15/2022 Target Resolution Date: 10/18/2022 Goal Status: Active Ulcer/skin breakdown will have a volume reduction of 30% by week 4 Date Initiated: 05/15/2022 Target Resolution Date: 10/18/2022 Goal Status: Active Interventions: Assess patient/caregiver ability to obtain necessary supplies Assess patient/caregiver ability to perform ulcer/skin care regimen upon admission and as needed Assess ulceration(s) every visit Provide education on ulcer and skin care Notes: Electronic Signature(s) Signed: 07/05/2022 4:59:08 PM By: Dellie Catholic RN Entered By: Dellie Catholic on 07/05/2022 16:56:08 -------------------------------------------------------------------------------- Pain Assessment Details Patient Name: Date of Service: Edward [name], Edward [name] MES L. 07/05/2022 2:30 PM Medical Record Number: [id number] Patient Account Number: [id number] Date of Birth/Sex: Treating RN: February 15, 1971 (51 y.o. Waldron Session Primary Care Tiahna Cure: [name] Other Clinician: Referring Victorio Creeden: Treating Ysidra Sopher/Extender: Kevan Ny in Treatment: 7 Active  Problems Location of Pain Severity and Description of Pain Patient Has Paino Yes Site Locations Pain Location: Generalized Pain Character of Pain Describe the Pain: Aching, Burning Pain Management and Medication Current Pain Management: JAQUE, DACY ([id number]) [id number] Page 6 of 15 Electronic Signature(s) Signed: 07/08/2022 4:43:27 PM By: Blanche East RN Entered By: Blanche East on 07/05/2022 14:57:58 --------------------------------------------------------------------------------  Patient/Caregiver Education Details Patient Name: Date of Service: Edward [name] MES L. 11/17/2023andnbsp2:30 PM Medical Record Number: [id number] Patient Account Number: [id number] Date of Birth/Gender: Treating RN: [birth year] (51 y.o. [name] Primary Care Physician: [name] Other Clinician: Referring Physician: Treating Physician/Extender: Kevan Ny in Treatment: 7 Education Assessment Education Provided To: Patient Education Topics Provided Wound/Skin Impairment: Methods: Explain/Verbal Responses: Return demonstration correctly Electronic Signature(s) Signed: 07/05/2022 4:59:08 PM By: Dellie Catholic RN Entered By: Dellie Catholic on 07/05/2022 16:56:23 -------------------------------------------------------------------------------- Wound Assessment Details Patient Name: Date of Service: Edward [name], Edward [name] MES L. 07/05/2022 2:30 PM Medical Record Number: [id number] Patient Account Number: [id number] Date of Birth/Sex: Treating RN: 08-02-1971 (51 y.o. Waldron Session Primary Care Dahna Hattabaugh: [name] Other Clinician: Referring Jolaine Fryberger: Treating Conal Shetley/Extender: Kevan Ny in Treatment: 7 Wound Status Wound Number: 11 Primary Diabetic Wound/Ulcer of the Lower Extremity Etiology: Wound Location: Left, Anterior Lower Leg Wound Open Wounding Event: Gradually Appeared Status: Date  Acquired: 12/17/2021 Comorbid Cataracts, Anemia, Sleep Apnea, Arrhythmia, Congestive Heart Weeks Of Treatment: 7 History: Failure, Coronary Artery Disease, Hypertension, Myocardial Clustered Wound: No Infarction, Peripheral Venous Disease, Type II Diabetes, End Stage Renal Disease, Osteoarthritis, Osteomyelitis, Neuropathy, Confinement Anxiety Photos FERNIE, GRIMM ([id number]) [id number] Page 7 of 15 Wound Measurements Length: (cm) 7.4 Width: (cm) 14 Depth: (cm) 0.1 Area: (cm) 81.367 Volume: (cm) 8.137 % Reduction in Area: -634.8% % Reduction in Volume: -635% Epithelialization: None Tunneling: No Undermining: No Wound Description Classification: Grade 2 Exudate Amount: Medium Exudate Type: Serosanguineous Exudate Color: red, brown Foul Odor After Cleansing: No Slough/Fibrino Yes Wound Bed Granulation Amount: Small (1-33%) Exposed Structure Granulation Quality: Pink Fascia Exposed: No Necrotic Amount: Large (67-100%) Fat Layer (Subcutaneous Tissue) Exposed: Yes Necrotic Quality: Eschar, Adherent Slough Tendon Exposed: No Muscle Exposed: Yes Necrosis of Muscle: No Joint Exposed: No Bone Exposed: No Periwound Skin Texture Texture Color No Abnormalities Noted: Yes No Abnormalities Noted: Yes Moisture No Abnormalities Noted: Yes Treatment Notes Wound #11 (Lower Leg) Wound Laterality: Left, Anterior Cleanser Soap and Water Discharge Instruction: May shower and wash wound with dial antibacterial soap and water prior to dressing change. Peri-Wound Care Sween Lotion (Moisturizing lotion) Discharge Instruction: Apply moisturizing lotion as directed Topical Primary Dressing IODOFLEX 0.9% Cadexomer Iodine Pad 4x6 cm Discharge Instruction: Use Iodosorb if Iodoflex not available Secondary Dressing ABD Pad, 5x9 Discharge Instruction: Apply over primary dressing as directed. Woven Gauze Sponge, Non-Sterile 4x4 in Discharge Instruction: Apply  over primary dressing as directed. Secured With [employer], 4.5x3.1 (in/yd) Discharge Instruction: Secure with Kerlix as directed. 19M Medipore H Soft Cloth Surgical T ape, 4 x 10 (in/yd) Discharge Instruction: Secure with tape as directed. Compression [id number] CAIN, FITZHENRY ([id number]) [id number] Page 8 of 15 Compression Stockings Add-Ons Electronic Signature(s) Signed: 07/08/2022 4:43:27 PM By: Blanche East RN Entered By: Blanche East on 07/05/2022 15:04:41 -------------------------------------------------------------------------------- Wound Assessment Details Patient Name: Date of Service: Edward [name], Edward [name] MES L. 07/05/2022 2:30 PM Medical Record Number: [id number] Patient Account Number: [id number] Date of Birth/Sex: Treating RN: 13-Aug-1971 (51 y.o. Waldron Session Primary Care Fadia Marlar: [name] Other Clinician: Referring Strider Vallance: Treating Kyi Romanello/Extender: Cecile Hearing Weeks in Treatment: 7 Wound Status Wound Number: 12 Primary Diabetic Wound/Ulcer of the Lower Extremity Etiology: Wound Location: Left Achilles Wound Open Wounding Event: Gradually Appeared Status: Date Acquired: 06/15/2022 Comorbid Cataracts, Anemia, Sleep Apnea, Arrhythmia, Congestive Heart Weeks Of Treatment: 2 History: Failure, Coronary Artery Disease, Hypertension, Myocardial Clustered  Wound: No Infarction, Peripheral Venous Disease, Type II Diabetes, End Stage Renal Disease, Osteoarthritis, Osteomyelitis, Neuropathy, Confinement Anxiety Photos Wound Measurements Length: (cm) 3.5 Width: (cm) 4.5 Depth: (cm) 0.1 Area: (cm) 12.37 Volume: (cm) 1.237 % Reduction in Area: -138.6% % Reduction in Volume: -138.8% Epithelialization: None Tunneling: No Undermining: No Wound Description Classification: Unable to visualize wound bed Exudate Amount: Medium Exudate Type: Serosanguineous Exudate Color: red, brown Foul Odor After Cleansing:  No Slough/Fibrino Yes Wound Bed Granulation Amount: Small (1-33%) Exposed Structure Granulation Quality: Pink Fascia Exposed: No Necrotic Amount: Large (67-100%) Fat Layer (Subcutaneous Tissue) Exposed: Yes Necrotic Quality: Eschar, Adherent Slough Tendon Exposed: No Muscle Exposed: No Joint Exposed: No Bone Exposed: No Periwound Skin Texture Texture Color BRITTAN, MAPEL ([id number]) [id number] Page 9 of 15 No Abnormalities Noted: No No Abnormalities Noted: No Pallor: Yes Moisture No Abnormalities Noted: No Temperature / Pain Temperature: No Abnormality Treatment Notes Wound #12 (Achilles) Wound Laterality: Left Cleanser Soap and Water Discharge Instruction: May shower and wash wound with dial antibacterial soap and water prior to dressing change. Peri-Wound Care Sween Lotion (Moisturizing lotion) Discharge Instruction: Apply moisturizing lotion as directed Topical Primary Dressing IODOFLEX 0.9% Cadexomer Iodine Pad 4x6 cm Discharge Instruction: Use Iodosorb if Iodoflex not available Secondary Dressing ABD Pad, 5x9 Discharge Instruction: Apply over primary dressing as directed. Woven Gauze Sponge, Non-Sterile 4x4 in Discharge Instruction: Apply over primary dressing as directed. Secured With [employer], 4.5x3.1 (in/yd) Discharge Instruction: Secure with Kerlix as directed. 9M Medipore H Soft Cloth Surgical T ape, 4 x 10 (in/yd) Discharge Instruction: Secure with tape as directed. Compression Wrap Compression Stockings Add-Ons Electronic Signature(s) Signed: 07/08/2022 4:43:27 PM By: Blanche East RN Entered By: Blanche East on 07/05/2022 15:05:55 -------------------------------------------------------------------------------- Wound Assessment Details Patient Name: Date of Service: Edward [name], Edward [name] MES L. 07/05/2022 2:30 PM Medical Record Number: [id number] Patient Account Number: [id number] Date of Birth/Sex: Treating  RN: April 02, 1971 (51 y.o. Waldron Session Primary Care Duward Allbritton: [name] Other Clinician: Referring Keiran Gaffey: Treating Haylee Mcanany/Extender: Kevan Ny in Treatment: 7 Wound Status Wound Number: 13 Primary Diabetic Wound/Ulcer of the Lower Extremity Etiology: Wound Location: Left, Circumferential Lower Leg Wound Open Wounding Event: Gradually Appeared Status: Date Acquired: 06/18/2022 Comorbid Cataracts, Anemia, Sleep Apnea, Arrhythmia, Congestive Heart Weeks Of Treatment: 2 History: Failure, Coronary Artery Disease, Hypertension, Myocardial Clustered Wound: No Infarction, Peripheral Venous Disease, Type II Diabetes, End Stage Renal Disease, Osteoarthritis, Osteomyelitis, Neuropathy, Confinement Anxiety Photos TRIPP, GOINS ([id number]) [id number] Page 10 of 15 Wound Measurements Length: (cm) 21 Width: (cm) 18.5 Depth: (cm) 0.1 Area: (cm) 305.127 Volume: (cm) 30.513 % Reduction in Area: -31.2% % Reduction in Volume: -31.2% Epithelialization: None Tunneling: No Undermining: No Wound Description Classification: Grade 2 Exudate Amount: Medium Exudate Type: Serosanguineous Exudate Color: red, brown Foul Odor After Cleansing: No Slough/Fibrino Yes Wound Bed Granulation Amount: Medium (34-66%) Exposed Structure Granulation Quality: Pink Fascia Exposed: No Necrotic Amount: Medium (34-66%) Fat Layer (Subcutaneous Tissue) Exposed: Yes Necrotic Quality: Eschar, Adherent Slough Tendon Exposed: No Muscle Exposed: No Joint Exposed: No Bone Exposed: No Periwound Skin Texture Texture Color No Abnormalities Noted: No No Abnormalities Noted: No Moisture No Abnormalities Noted: No Treatment Notes Wound #13 (Lower Leg) Wound Laterality: Left, Circumferential Cleanser Soap and Water Discharge Instruction: May shower and wash wound with dial antibacterial soap and water prior to dressing change. Peri-Wound  Care Sween Lotion (Moisturizing lotion) Discharge Instruction: Apply moisturizing lotion as directed Topical Primary Dressing IODOFLEX 0.9% Cadexomer Iodine Pad 4x6 cm  Discharge Instruction: Use Iodosorb if Iodoflex not available Secondary Dressing ABD Pad, 5x9 Discharge Instruction: Apply over primary dressing as directed. Woven Gauze Sponge, Non-Sterile 4x4 in Discharge Instruction: Apply over primary dressing as directed. Secured With [employer], 4.5x3.1 (in/yd) Discharge Instruction: Secure with Kerlix as directed. 66M Medipore H Soft Cloth Surgical T ape, 4 x 10 (in/yd) Discharge Instruction: Secure with tape as directed. Compression [address] TINO, RONAN ([id number]) [id number] Page 11 of 15 Compression Stockings Add-Ons Electronic Signature(s) Signed: 07/08/2022 4:43:27 PM By: Blanche East RN Entered By: Blanche East on 07/05/2022 15:07:43 -------------------------------------------------------------------------------- Wound Assessment Details Patient Name: Date of Service: Edward [name], Edward [name] MES L. 07/05/2022 2:30 PM Medical Record Number: [id number] Patient Account Number: [id number] Date of Birth/Sex: Treating RN: 08-Feb-1971 (51 y.o. Waldron Session Primary Care Iridiana Fonner: [name] Other Clinician: Referring Mishon Blubaugh: Treating Ilo Beamon/Extender: Kevan Ny in Treatment: 7 Wound Status Wound Number: 14 Primary Diabetic Wound/Ulcer of the Lower Extremity Etiology: Wound Location: Left, Dorsal Foot Wound Open Wounding Event: Gradually Appeared Status: Date Acquired: 06/18/2022 Comorbid Cataracts, Anemia, Sleep Apnea, Arrhythmia, Congestive Heart Weeks Of Treatment: 2 History: Failure, Coronary Artery Disease, Hypertension, Myocardial Clustered Wound: No Infarction, Peripheral Venous Disease, Type II Diabetes, End Stage Renal Disease, Osteoarthritis, Osteomyelitis, Neuropathy, Confinement  Anxiety Photos Wound Measurements Length: (cm) 1.2 Width: (cm) 1.8 Depth: (cm) 0.3 Area: (cm) 1.696 Volume: (cm) 0.509 % Reduction in Area: -63.5% % Reduction in Volume: -145.9% Epithelialization: None Tunneling: No Undermining: No Wound Description Classification: Grade 2 Exudate Amount: Medium Exudate Type: Serosanguineous Exudate Color: red, brown Wound Bed Granulation Amount: Small (1-33%) Exposed Structure Granulation Quality: Pink Fascia Exposed: No Necrotic Amount: Large (67-100%) Fat Layer (Subcutaneous Tissue) Exposed: Yes Necrotic Quality: Eschar, Adherent Slough Tendon Exposed: No Muscle Exposed: No Joint Exposed: No Bone Exposed: No Periwound Skin Texture Texture Color No Abnormalities Noted: No No Abnormalities Noted: No ARSEN, MANGIONE ([id number]) [id number] Page 12 of 15 Scarring: Yes Hemosiderin Staining: Yes Moisture Temperature / Pain No Abnormalities Noted: No Temperature: No Abnormality Maceration: Yes Treatment Notes Wound #14 (Foot) Wound Laterality: Dorsal, Left Cleanser Soap and Water Discharge Instruction: May shower and wash wound with dial antibacterial soap and water prior to dressing change. Peri-Wound Care Sween Lotion (Moisturizing lotion) Discharge Instruction: Apply moisturizing lotion as directed Topical Primary Dressing IODOFLEX 0.9% Cadexomer Iodine Pad 4x6 cm Discharge Instruction: Use Iodosorb if Iodoflex not available Secondary Dressing ABD Pad, 5x9 Discharge Instruction: Apply over primary dressing as directed. Woven Gauze Sponge, Non-Sterile 4x4 in Discharge Instruction: Apply over primary dressing as directed. Secured With [employer], 4.5x3.1 (in/yd) Discharge Instruction: Secure with Kerlix as directed. 66M Medipore H Soft Cloth Surgical T ape, 4 x 10 (in/yd) Discharge Instruction: Secure with tape as directed. Compression Wrap Compression Stockings Add-Ons Electronic  Signature(s) Signed: 07/08/2022 4:43:27 PM By: Blanche East RN Entered By: Blanche East on 07/05/2022 15:08:32 -------------------------------------------------------------------------------- Wound Assessment Details Patient Name: Date of Service: Edward [name], Edward [name] MES L. 07/05/2022 2:30 PM Medical Record Number: [id number] Patient Account Number: [id number] Date of Birth/Sex: Treating RN: 1970/10/18 (51 y.o. Waldron Session Primary Care Kaydence Menard: [name] Other Clinician: Referring Loc Feinstein: Treating Lanai Conlee/Extender: Cecile Hearing Weeks in Treatment: 7 Wound Status Wound Number: 15 Primary Diabetic Wound/Ulcer of the Lower Extremity Etiology: Wound Location: Left, Proximal, Anterior Lower Leg Wound Open Wounding Event: Gradually Appeared Status: Date Acquired: 06/18/2022 Comorbid Cataracts, Anemia, Sleep Apnea, Arrhythmia, Congestive Heart Weeks Of Treatment: 2 History: Failure, Coronary Artery  Disease, Hypertension, Myocardial Clustered Wound: No Infarction, Peripheral Venous Disease, Type II Diabetes, End Stage Renal Disease, Osteoarthritis, Osteomyelitis, Neuropathy, Confinement Anxiety Photos ABHIMANYU, CRUCES ([id number]) [id number] Page 13 of 15 Wound Measurements Length: (cm) 0.5 Width: (cm) 0.4 Depth: (cm) 0.1 Area: (cm) 0.157 Volume: (cm) 0.016 % Reduction in Area: 93.3% % Reduction in Volume: 93.2% Tunneling: No Undermining: No Wound Description Classification: Grade 2 Exudate Amount: Medium Exudate Type: Serosanguineous Exudate Color: red, brown Foul Odor After Cleansing: No Slough/Fibrino Yes Wound Bed Granulation Amount: Large (67-100%) Exposed Structure Granulation Quality: Pink Fascia Exposed: No Necrotic Amount: Small (1-33%) Fat Layer (Subcutaneous Tissue) Exposed: Yes Necrotic Quality: Eschar, Adherent Slough Tendon Exposed: No Muscle Exposed: No Joint Exposed: No Bone Exposed: No Periwound  Skin Texture Texture Color No Abnormalities Noted: No No Abnormalities Noted: Yes Scarring: Yes Temperature / Pain Temperature: No Abnormality Moisture No Abnormalities Noted: Yes Treatment Notes Wound #15 (Lower Leg) Wound Laterality: Left, Anterior, Proximal Cleanser Soap and Water Discharge Instruction: May shower and wash wound with dial antibacterial soap and water prior to dressing change. Peri-Wound Care Sween Lotion (Moisturizing lotion) Discharge Instruction: Apply moisturizing lotion as directed Topical Primary Dressing IODOFLEX 0.9% Cadexomer Iodine Pad 4x6 cm Discharge Instruction: Use Iodosorb if Iodoflex not available Secondary Dressing ABD Pad, 5x9 Discharge Instruction: Apply over primary dressing as directed. Woven Gauze Sponge, Non-Sterile 4x4 in Discharge Instruction: Apply over primary dressing as directed. Secured With [employer], 4.5x3.1 (in/yd) Discharge Instruction: Secure with Kerlix as directed. 40M Medipore H Soft Cloth Surgical T ape, 4 x 10 (in/yd) Discharge Instruction: Secure with tape as directed. Compression [id number] KIMMY, PARISH ([id number]) [id number] Page 14 of 15 Compression Stockings Add-Ons Electronic Signature(s) Signed: 07/08/2022 4:43:27 PM By: Blanche East RN Entered By: Blanche East on 07/05/2022 15:09:18 -------------------------------------------------------------------------------- Wound Assessment Details Patient Name: Date of Service: Edward [name], Edward [name] MES L. 07/05/2022 2:30 PM Medical Record Number: [id number] Patient Account Number: [id number] Date of Birth/Sex: Treating RN: 1971/05/29 (51 y.o. Waldron Session Primary Care Zoua Caporaso: [name] Other Clinician: Referring Venesha Petraitis: Treating Lianah Peed/Extender: Kevan Ny in Treatment: 7 Wound Status Wound Number: 16 Primary Diabetic Wound/Ulcer of the Lower Extremity Etiology: Wound Location: Left, Distal,  Medial Lower Leg Wound Open Wounding Event: Gradually Appeared Status: Date Acquired: 06/18/2022 Comorbid Cataracts, Anemia, Sleep Apnea, Arrhythmia, Congestive Heart Weeks Of Treatment: 2 History: Failure, Coronary Artery Disease, Hypertension, Myocardial Clustered Wound: Yes Infarction, Peripheral Venous Disease, Type II Diabetes, End Stage Renal Disease, Osteoarthritis, Osteomyelitis, Neuropathy, Confinement Anxiety Photos Wound Measurements Length: (cm) 3 Width: (cm) 0.5 Depth: (cm) 0.1 Area: (cm) 1.178 Volume: (cm) 0.118 % Reduction in Area: 50% % Reduction in Volume: 50% Tunneling: No Undermining: No Wound Description Classification: Grade 2 Exudate Amount: Medium Exudate Type: Serosanguineous Exudate Color: red, brown Foul Odor After Cleansing: No Slough/Fibrino Yes Wound Bed Granulation Amount: Small (1-33%) Exposed Structure Granulation Quality: Pink Fascia Exposed: No Necrotic Amount: Large (67-100%) Fat Layer (Subcutaneous Tissue) Exposed: Yes Necrotic Quality: Eschar, Adherent Slough Tendon Exposed: No Muscle Exposed: No Joint Exposed: No Bone Exposed: No Periwound Skin Texture Texture Color ANTONEY, BIVEN ([id number]) [id number] Page 15 of 15 No Abnormalities Noted: Yes No Abnormalities Noted: Yes Moisture Temperature / Pain No Abnormalities Noted: Yes Temperature: No Abnormality Treatment Notes Wound #16 (Lower Leg) Wound Laterality: Left, Medial, Distal Cleanser Soap and Water Discharge Instruction: May shower and wash wound with dial antibacterial soap and water prior to dressing change. Peri-Wound Care Sween Lotion (Moisturizing lotion) Discharge  Instruction: Apply moisturizing lotion as directed Topical Primary Dressing IODOFLEX 0.9% Cadexomer Iodine Pad 4x6 cm Discharge Instruction: Use Iodosorb if Iodoflex not available Secondary Dressing ABD Pad, 5x9 Discharge Instruction: Apply over primary dressing as  directed. Woven Gauze Sponge, Non-Sterile 4x4 in Discharge Instruction: Apply over primary dressing as directed. Secured With [employer], 4.5x3.1 (in/yd) Discharge Instruction: Secure with Kerlix as directed. 15M Medipore H Soft Cloth Surgical T ape, 4 x 10 (in/yd) Discharge Instruction: Secure with tape as directed. Compression Wrap Compression Stockings Add-Ons Electronic Signature(s) Signed: 07/08/2022 4:43:27 PM By: Blanche East RN Entered By: Blanche East on 07/05/2022 15:10:22 -------------------------------------------------------------------------------- Vitals Details Patient Name: Date of Service: Edward [name], [name] 07/05/2022 2:30 PM Medical Record Number: [id number] Patient Account Number: [id number] Date of Birth/Sex: Treating RN: 03/08/1971 (51 y.o. Waldron Session Primary Care Oneil Behney: [name] Other Clinician: Referring Ygnacio Fecteau: Treating Olean Sangster/Extender: Kevan Ny in Treatment: 7 Vital Signs Time Taken: 14:55 Temperature (F): 98.9 Pulse (bpm): 65 Respiratory Rate (breaths/min): 18 Blood Pressure (mmHg): 122/61 Reference Range: 80 - 120 mg / dl Electronic Signature(s) Signed: 07/08/2022 4:43:27 PM By: Blanche East RN Entered By: Blanche East on 07/05/2022 14:57:54

## 2022-07-10 DIAGNOSIS — N2581 Secondary hyperparathyroidism of renal origin: Secondary | ICD-10-CM | POA: Diagnosis not present

## 2022-07-10 DIAGNOSIS — D631 Anemia in chronic kidney disease: Secondary | ICD-10-CM | POA: Diagnosis not present

## 2022-07-10 DIAGNOSIS — R52 Pain, unspecified: Secondary | ICD-10-CM | POA: Diagnosis not present

## 2022-07-10 DIAGNOSIS — Z992 Dependence on renal dialysis: Secondary | ICD-10-CM | POA: Diagnosis not present

## 2022-07-10 DIAGNOSIS — E1129 Type 2 diabetes mellitus with other diabetic kidney complication: Secondary | ICD-10-CM | POA: Diagnosis not present

## 2022-07-10 DIAGNOSIS — N186 End stage renal disease: Secondary | ICD-10-CM | POA: Diagnosis not present

## 2022-07-10 DIAGNOSIS — D689 Coagulation defect, unspecified: Secondary | ICD-10-CM | POA: Diagnosis not present

## 2022-07-10 DIAGNOSIS — L299 Pruritus, unspecified: Secondary | ICD-10-CM | POA: Diagnosis not present

## 2022-07-10 DIAGNOSIS — T8249XD Other complication of vascular dialysis catheter, subsequent encounter: Secondary | ICD-10-CM | POA: Diagnosis not present

## 2022-07-12 DIAGNOSIS — R52 Pain, unspecified: Secondary | ICD-10-CM | POA: Diagnosis not present

## 2022-07-12 DIAGNOSIS — T148XXA Other injury of unspecified body region, initial encounter: Secondary | ICD-10-CM | POA: Diagnosis not present

## 2022-07-12 DIAGNOSIS — S68119D Complete traumatic metacarpophalangeal amputation of unspecified finger, subsequent encounter: Secondary | ICD-10-CM | POA: Diagnosis not present

## 2022-07-12 DIAGNOSIS — I998 Other disorder of circulatory system: Secondary | ICD-10-CM | POA: Diagnosis not present

## 2022-07-13 DIAGNOSIS — L299 Pruritus, unspecified: Secondary | ICD-10-CM | POA: Diagnosis not present

## 2022-07-13 DIAGNOSIS — N186 End stage renal disease: Secondary | ICD-10-CM | POA: Diagnosis not present

## 2022-07-13 DIAGNOSIS — R52 Pain, unspecified: Secondary | ICD-10-CM | POA: Diagnosis not present

## 2022-07-13 DIAGNOSIS — E1129 Type 2 diabetes mellitus with other diabetic kidney complication: Secondary | ICD-10-CM | POA: Diagnosis not present

## 2022-07-13 DIAGNOSIS — Z992 Dependence on renal dialysis: Secondary | ICD-10-CM | POA: Diagnosis not present

## 2022-07-13 DIAGNOSIS — D631 Anemia in chronic kidney disease: Secondary | ICD-10-CM | POA: Diagnosis not present

## 2022-07-13 DIAGNOSIS — T8249XD Other complication of vascular dialysis catheter, subsequent encounter: Secondary | ICD-10-CM | POA: Diagnosis not present

## 2022-07-13 DIAGNOSIS — D689 Coagulation defect, unspecified: Secondary | ICD-10-CM | POA: Diagnosis not present

## 2022-07-13 DIAGNOSIS — N2581 Secondary hyperparathyroidism of renal origin: Secondary | ICD-10-CM | POA: Diagnosis not present

## 2022-07-15 DIAGNOSIS — I96 Gangrene, not elsewhere classified: Secondary | ICD-10-CM | POA: Diagnosis not present

## 2022-07-15 DIAGNOSIS — M79645 Pain in left finger(s): Secondary | ICD-10-CM | POA: Diagnosis not present

## 2022-07-15 DIAGNOSIS — S68119D Complete traumatic metacarpophalangeal amputation of unspecified finger, subsequent encounter: Secondary | ICD-10-CM | POA: Diagnosis not present

## 2022-07-15 DIAGNOSIS — M79644 Pain in right finger(s): Secondary | ICD-10-CM | POA: Diagnosis not present

## 2022-07-15 DIAGNOSIS — T148XXA Other injury of unspecified body region, initial encounter: Secondary | ICD-10-CM | POA: Diagnosis not present

## 2022-07-15 DIAGNOSIS — M25641 Stiffness of right hand, not elsewhere classified: Secondary | ICD-10-CM | POA: Diagnosis not present

## 2022-07-15 DIAGNOSIS — L02519 Cutaneous abscess of unspecified hand: Secondary | ICD-10-CM | POA: Diagnosis not present

## 2022-07-15 DIAGNOSIS — I998 Other disorder of circulatory system: Secondary | ICD-10-CM | POA: Diagnosis not present

## 2022-07-16 DIAGNOSIS — R52 Pain, unspecified: Secondary | ICD-10-CM | POA: Diagnosis not present

## 2022-07-16 DIAGNOSIS — L299 Pruritus, unspecified: Secondary | ICD-10-CM | POA: Diagnosis not present

## 2022-07-16 DIAGNOSIS — N186 End stage renal disease: Secondary | ICD-10-CM | POA: Diagnosis not present

## 2022-07-16 DIAGNOSIS — Z992 Dependence on renal dialysis: Secondary | ICD-10-CM | POA: Diagnosis not present

## 2022-07-16 DIAGNOSIS — E1129 Type 2 diabetes mellitus with other diabetic kidney complication: Secondary | ICD-10-CM | POA: Diagnosis not present

## 2022-07-16 DIAGNOSIS — N2581 Secondary hyperparathyroidism of renal origin: Secondary | ICD-10-CM | POA: Diagnosis not present

## 2022-07-16 DIAGNOSIS — D631 Anemia in chronic kidney disease: Secondary | ICD-10-CM | POA: Diagnosis not present

## 2022-07-16 DIAGNOSIS — T8249XD Other complication of vascular dialysis catheter, subsequent encounter: Secondary | ICD-10-CM | POA: Diagnosis not present

## 2022-07-16 DIAGNOSIS — D689 Coagulation defect, unspecified: Secondary | ICD-10-CM | POA: Diagnosis not present

## 2022-07-18 DIAGNOSIS — E1129 Type 2 diabetes mellitus with other diabetic kidney complication: Secondary | ICD-10-CM | POA: Diagnosis not present

## 2022-07-18 DIAGNOSIS — L299 Pruritus, unspecified: Secondary | ICD-10-CM | POA: Diagnosis not present

## 2022-07-18 DIAGNOSIS — Z992 Dependence on renal dialysis: Secondary | ICD-10-CM | POA: Diagnosis not present

## 2022-07-18 DIAGNOSIS — D631 Anemia in chronic kidney disease: Secondary | ICD-10-CM | POA: Diagnosis not present

## 2022-07-18 DIAGNOSIS — T8249XD Other complication of vascular dialysis catheter, subsequent encounter: Secondary | ICD-10-CM | POA: Diagnosis not present

## 2022-07-18 DIAGNOSIS — D689 Coagulation defect, unspecified: Secondary | ICD-10-CM | POA: Diagnosis not present

## 2022-07-18 DIAGNOSIS — R52 Pain, unspecified: Secondary | ICD-10-CM | POA: Diagnosis not present

## 2022-07-18 DIAGNOSIS — N2581 Secondary hyperparathyroidism of renal origin: Secondary | ICD-10-CM | POA: Diagnosis not present

## 2022-07-18 DIAGNOSIS — N186 End stage renal disease: Secondary | ICD-10-CM | POA: Diagnosis not present

## 2022-07-19 ENCOUNTER — Ambulatory Visit: Payer: Medicare Other | Attending: Cardiology | Admitting: Cardiology

## 2022-07-19 ENCOUNTER — Encounter (HOSPITAL_BASED_OUTPATIENT_CLINIC_OR_DEPARTMENT_OTHER): Payer: Medicare Other | Attending: General Surgery | Admitting: General Surgery

## 2022-07-19 DIAGNOSIS — M79644 Pain in right finger(s): Secondary | ICD-10-CM | POA: Diagnosis not present

## 2022-07-19 DIAGNOSIS — I998 Other disorder of circulatory system: Secondary | ICD-10-CM | POA: Diagnosis not present

## 2022-07-19 DIAGNOSIS — I2510 Atherosclerotic heart disease of native coronary artery without angina pectoris: Secondary | ICD-10-CM | POA: Diagnosis not present

## 2022-07-19 DIAGNOSIS — Z89611 Acquired absence of right leg above knee: Secondary | ICD-10-CM | POA: Diagnosis not present

## 2022-07-19 DIAGNOSIS — E11622 Type 2 diabetes mellitus with other skin ulcer: Secondary | ICD-10-CM | POA: Diagnosis not present

## 2022-07-19 DIAGNOSIS — L97422 Non-pressure chronic ulcer of left heel and midfoot with fat layer exposed: Secondary | ICD-10-CM | POA: Diagnosis not present

## 2022-07-19 DIAGNOSIS — Z833 Family history of diabetes mellitus: Secondary | ICD-10-CM | POA: Insufficient documentation

## 2022-07-19 DIAGNOSIS — I5042 Chronic combined systolic (congestive) and diastolic (congestive) heart failure: Secondary | ICD-10-CM | POA: Insufficient documentation

## 2022-07-19 DIAGNOSIS — E11621 Type 2 diabetes mellitus with foot ulcer: Secondary | ICD-10-CM | POA: Insufficient documentation

## 2022-07-19 DIAGNOSIS — E1140 Type 2 diabetes mellitus with diabetic neuropathy, unspecified: Secondary | ICD-10-CM | POA: Insufficient documentation

## 2022-07-19 DIAGNOSIS — Z89511 Acquired absence of right leg below knee: Secondary | ICD-10-CM | POA: Diagnosis not present

## 2022-07-19 DIAGNOSIS — L97522 Non-pressure chronic ulcer of other part of left foot with fat layer exposed: Secondary | ICD-10-CM | POA: Insufficient documentation

## 2022-07-19 DIAGNOSIS — Z992 Dependence on renal dialysis: Secondary | ICD-10-CM | POA: Insufficient documentation

## 2022-07-19 DIAGNOSIS — N186 End stage renal disease: Secondary | ICD-10-CM | POA: Insufficient documentation

## 2022-07-19 DIAGNOSIS — I132 Hypertensive heart and chronic kidney disease with heart failure and with stage 5 chronic kidney disease, or end stage renal disease: Secondary | ICD-10-CM | POA: Insufficient documentation

## 2022-07-19 DIAGNOSIS — M25641 Stiffness of right hand, not elsewhere classified: Secondary | ICD-10-CM | POA: Diagnosis not present

## 2022-07-19 DIAGNOSIS — L97825 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 without evidence of necrosis: Secondary | ICD-10-CM | POA: Diagnosis not present

## 2022-07-19 DIAGNOSIS — I96 Gangrene, not elsewhere classified: Secondary | ICD-10-CM | POA: Diagnosis not present

## 2022-07-19 DIAGNOSIS — L97325 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: Secondary | ICD-10-CM | POA: Insufficient documentation

## 2022-07-19 DIAGNOSIS — L97822 Non-pressure chronic ulcer of other part of left lower leg with fat layer exposed: Secondary | ICD-10-CM | POA: Diagnosis not present

## 2022-07-19 DIAGNOSIS — E1122 Type 2 diabetes mellitus with diabetic chronic kidney disease: Secondary | ICD-10-CM | POA: Diagnosis not present

## 2022-07-19 DIAGNOSIS — L02519 Cutaneous abscess of unspecified hand: Secondary | ICD-10-CM | POA: Diagnosis not present

## 2022-07-19 DIAGNOSIS — T148XXA Other injury of unspecified body region, initial encounter: Secondary | ICD-10-CM | POA: Diagnosis not present

## 2022-07-19 DIAGNOSIS — E1152 Type 2 diabetes mellitus with diabetic peripheral angiopathy with gangrene: Secondary | ICD-10-CM | POA: Insufficient documentation

## 2022-07-19 DIAGNOSIS — M79645 Pain in left finger(s): Secondary | ICD-10-CM | POA: Diagnosis not present

## 2022-07-19 DIAGNOSIS — S68119D Complete traumatic metacarpophalangeal amputation of unspecified finger, subsequent encounter: Secondary | ICD-10-CM | POA: Diagnosis not present

## 2022-07-19 NOTE — Progress Notes (Signed)
JJ, ENYEART ([id number]) [id number] Page 1 of 14 Visit Report for 07/19/2022 Arrival Information Details Patient Name: Date of Service: Edward [name] MES [name]. 07/19/2022 1:15 PM Medical Record Number: [id number] Patient Account Number: [id number] Date of Birth/Sex: Treating RN: [birth year] (51 y.o. [name] Primary Care Delsie Amador: [name] Other Clinician: Referring Mccauley Diehl: Treating Toniyah Dilmore/Extender: Kevan Ny in Treatment: 9 Visit Information History Since Last Visit Added or deleted any medications: No Patient Arrived: Wheel Chair Any new allergies or adverse reactions: No Arrival Time: 13:25 Signs or symptoms of abuse/neglect since last visito No Accompanied By: son Hospitalized since last visit: No Transfer Assistance: Manual Implantable device outside of the clinic excluding No Patient Identification Verified: Yes cellular tissue based products placed in the center Patient Requires Transmission-Based Precautions: No since last visit: Patient Has Alerts: Yes Has Dressing in Place as Prescribed: Yes Patient Alerts: Patient on Blood Thinner Pain Present Now: Yes plavix Electronic Signature(s) Signed: 07/19/2022 5:00:00 PM By: Dellie Catholic RN Entered By: Dellie Catholic on 07/19/2022 13:47:38 -------------------------------------------------------------------------------- Encounter Discharge Information Details Patient Name: Date of Service: Edward [name], Edward Brandy MES [name]. 07/19/2022 1:15 PM Medical Record Number: [id number] Patient Account Number: [id number] Date of Birth/Sex: Treating RN: 1971-02-25 (51 y.o. [name] Primary Care Wally Behan: [name] Other Clinician: Referring Loudon Krakow: Treating Denny Mccree/Extender: Kevan Ny in Treatment: 9 Encounter Discharge Information Items Post Procedure Vitals Discharge Condition: Stable Temperature (F): 98.6 Ambulatory Status:  Wheelchair Pulse (bpm): 59 Discharge Destination: Home Respiratory Rate (breaths/min): 18 Transportation: Private Auto Blood Pressure (mmHg): 114/71 Accompanied By: son Schedule Follow-up Appointment: Yes Clinical Summary of Care: Patient Declined Electronic Signature(s) Signed: 07/19/2022 5:00:00 PM By: Dellie Catholic RN Entered By: Dellie Catholic on 07/19/2022 16:56:57 [name] ([id number]) [id number] Page 2 of 14 -------------------------------------------------------------------------------- Lower Extremity Assessment Details Patient Name: Date of Service: Edward [name] MES [name]. 07/19/2022 1:15 PM Medical Record Number: [id number] Patient Account Number: [id number] Date of Birth/Sex: Treating RN: Jun 22, 1971 (51 y.o. [name] Primary Care Sanela Evola: [name] Other Clinician: Referring Deniz Hannan: Treating Odai Wimmer/Extender: Cecile Hearing Weeks in Treatment: 9 Edema Assessment Assessed: [Left: No] [Right: No] [Left: Edema] [Right: :] Calf Left: Right: Point of Measurement: From Medial Instep 44.6 cm Ankle Left: Right: Point of Measurement: From Medial Instep 24.7 cm Vascular Assessment Pulses: Dorsalis Pedis Palpable: [Left:Yes] Electronic Signature(s) Signed: 07/19/2022 5:00:00 PM By: Dellie Catholic RN Entered By: Dellie Catholic on 07/19/2022 13:49:07 -------------------------------------------------------------------------------- Multi Wound Chart Details Patient Name: Date of Service: Edward [name], Edward Brandy MES [name]. 07/19/2022 1:15 PM Medical Record Number: [id number] Patient Account Number: [id number] Date of Birth/Sex: Treating RN: 23-Apr-1971 (51 y.o. M) Primary Care Latyra Jaye: [name] Other Clinician: Referring Sharetta Ricchio: Treating Kaynen Minner/Extender: Kevan Ny in Treatment: 9 Vital Signs Height(in): Pulse(bpm): 59 Weight(lbs): Blood Pressure(mmHg): 114/71 Body Mass  Index(BMI): Temperature(F): 98.6 Respiratory Rate(breaths/min): 18 [13:Photos:] [id number] Page 3 of 14] Left, Circumferential Lower Leg Left, Dorsal Foot Left, Proximal, Anterior Lower Leg Wound Location: Gradually Appeared Gradually Appeared Gradually Appeared Wounding Event: Diabetic Wound/Ulcer of the Lower Diabetic Wound/Ulcer of the Lower Diabetic Wound/Ulcer of the Lower Primary Etiology: Extremity Extremity Extremity Cataracts, Anemia, Sleep Apnea, Cataracts, Anemia, Sleep Apnea, Cataracts, Anemia, Sleep Apnea, Comorbid History: Arrhythmia, Congestive Heart Failure, Arrhythmia, Congestive Heart Failure, Arrhythmia, Congestive Heart Failure, Coronary Artery Disease, Coronary Artery Disease, Coronary Artery Disease, Hypertension, Myocardial Infarction, Hypertension, Myocardial Infarction, Hypertension, Myocardial Infarction, Peripheral Venous Disease, Type II Peripheral  Venous Disease, Type II Peripheral Venous Disease, Type II Diabetes, End Stage Renal Disease, Diabetes, End Stage Renal Disease, Diabetes, End Stage Renal Disease, Osteoarthritis, Osteomyelitis, Osteoarthritis, Osteomyelitis, Osteoarthritis, Osteomyelitis, Neuropathy, Confinement Anxiety Neuropathy, Confinement Anxiety Neuropathy, Confinement Anxiety 06/18/2022 06/18/2022 06/18/2022 Date Acquired: '4 4 4 '$ Weeks of Treatment: Open Open Open Wound Status: No No No Wound Recurrence: No No No Clustered Wound: 25.5x22x0.3 1.6x1.7x0.2 1x1x0.1 Measurements [name] x W x D (cm) 440.608 2.136 0.785 A (cm) : rea 132.183 0.427 0.079 Volume (cm) : -89.50% -106.00% 66.70% % Reduction in A rea: -468.60% -106.30% 66.50% % Reduction in Volume: Grade 2 Grade 2 Grade 2 Classification: Medium Medium Medium Exudate A mount: Serosanguineous Serosanguineous Serosanguineous Exudate Type: red, brown red, brown red, brown Exudate Color: Medium (34-66%) Small (1-33%) Large (67-100%) Granulation  A mount: Pink Pink Pink Granulation Quality: Medium (34-66%) Large (67-100%) Small (1-33%) Necrotic A mount: Eschar, Adherent Slough Eschar, Adherent Slough Eschar, Adherent Slough Necrotic Tissue: Fat Layer (Subcutaneous Tissue): Yes Fat Layer (Subcutaneous Tissue): Yes Fat Layer (Subcutaneous Tissue): Yes Exposed Structures: Fascia: No Fascia: No Fascia: No Tendon: No Tendon: No Tendon: No Muscle: No Muscle: No Muscle: No Joint: No Joint: No Joint: No Bone: No Bone: No Bone: No None None N/A Epithelialization: Debridement - Excisional Debridement - Excisional Debridement - Selective/Open Wound Debridement: Pre-procedure Verification/Time Out 14:01 14:01 14:01 Taken: Lidocaine 5% topical ointment Lidocaine 5% topical ointment Lidocaine 5% topical ointment Pain Control: Other, Subcutaneous, Slough Subcutaneous, Psychologist, prison and probation services, [employer] Tissue Debrided: Skin/Subcutaneous Tissue Skin/Subcutaneous Tissue Non-Viable Tissue Level: 561 2.72 1 Debridement A (sq cm): rea Curette Curette Curette Instrument: Minimum Minimum Minimum Bleeding: Pressure Pressure Pressure Hemostasis A chieved: 0 0 0 Procedural Pain: 0 0 0 Post Procedural Pain: Procedure was tolerated well Procedure was tolerated well Procedure was tolerated well Debridement Treatment Response: 25.5x22x0.3 1.6x1.7x0.2 1x1x0.1 Post Debridement Measurements [name] x W x D (cm) 132.183 0.427 0.079 Post Debridement Volume: (cm) Excoriation: No Scarring: Yes Scarring: Yes Periwound Skin Texture: Induration: No Callus: No Crepitus: No Rash: No Scarring: No Dry/Scaly: Yes Maceration: Yes No Abnormalities Noted Periwound Skin Moisture: Maceration: No Atrophie Blanche: No Hemosiderin Staining: Yes No Abnormalities Noted Periwound Skin Color: Cyanosis: No Ecchymosis: No Erythema: No Hemosiderin Staining: No Mottled: No Pallor: No Rubor: No No Abnormality No Abnormality No  Abnormality Temperature: Debridement Debridement Debridement Procedures Performed: Wound Number: [id number] 17 N/A Photos: N/A Edward [name], Edward [name] ([id number]) [phone].pdf Page 4 of 14 Left, Distal, Medial Lower Leg Left Amputation Site - Transmetatarsal N/A Wound Location: Gradually Appeared Blister N/A Wounding Event: Diabetic Wound/Ulcer of the Lower Diabetic Wound/Ulcer of the Lower N/A Primary Etiology: Extremity Extremity Cataracts, Anemia, Sleep Apnea, Cataracts, Anemia, Sleep Apnea, N/A Comorbid History: Arrhythmia, Congestive Heart Failure, Arrhythmia, Congestive Heart Failure, Coronary Artery Disease, Coronary Artery Disease, Hypertension, Myocardial Infarction, Hypertension, Myocardial Infarction, Peripheral Venous Disease, Type II Peripheral Venous Disease, Type II Diabetes, End Stage Renal Disease, Diabetes, End Stage Renal Disease, Osteoarthritis, Osteomyelitis, Osteoarthritis, Osteomyelitis, Neuropathy, Confinement Anxiety Neuropathy, Confinement Anxiety 06/18/2022 07/12/2022 N/A Date Acquired: 4 0 N/A Weeks of Treatment: Open Open N/A Wound Status: No No N/A Wound Recurrence: Yes No N/A Clustered Wound: 1.3x0.6x0.1 2x2.5x0.2 N/A Measurements [name] x W x D (cm) 0.613 3.927 N/A A (cm) : rea 0.061 0.785 N/A Volume (cm) : 74.00% N/A N/A % Reduction in A rea: 74.20% N/A N/A % Reduction in Volume: Grade 2 Grade 1 N/A Classification: Medium Medium N/A Exudate A mount: Serosanguineous Serosanguineous N/A Exudate Type: red, brown red, brown N/A Exudate Color:  Small (1-33%) Small (1-33%) N/A Granulation A mount: Pink Red N/A Granulation Quality: Large (67-100%) Large (67-100%) N/A Necrotic A mount: Eschar, Adherent Slough Eschar, Adherent Slough N/A Necrotic Tissue: Fat Layer (Subcutaneous Tissue): Yes Fat Layer (Subcutaneous Tissue): Yes N/A Exposed Structures: Fascia: No Fascia: No Tendon: No Tendon: No Muscle: No Muscle:  No Joint: No Joint: No Bone: No Bone: No N/A None N/A Epithelialization: Debridement - Excisional Debridement - Excisional N/A Debridement: Pre-procedure Verification/Time Out 14:01 14:01 N/A Taken: Lidocaine 5% topical ointment Lidocaine 5% topical ointment N/A Pain Control: Subcutaneous, Slough Subcutaneous, Slough N/A Tissue Debrided: Skin/Subcutaneous Tissue Skin/Subcutaneous Tissue N/A Level: 0.78 5 N/A Debridement A (sq cm): rea Curette Curette N/A Instrument: Minimum Minimum N/A Bleeding: Pressure Pressure N/A Hemostasis A chieved: 0 0 N/A Procedural Pain: 0 0 N/A Post Procedural Pain: Procedure was tolerated well Procedure was tolerated well N/A Debridement Treatment Response: 1.3x0.6x0.1 2x2.5x0.2 N/A Post Debridement Measurements [name] x W x D (cm) 0.061 0.785 N/A Post Debridement Volume: (cm) No Abnormalities Noted No Abnormalities Noted N/A Periwound Skin Texture: No Abnormalities Noted No Abnormalities Noted N/A Periwound Skin Moisture: No Abnormalities Noted No Abnormalities Noted N/A Periwound Skin Color: No Abnormality No Abnormality N/A Temperature: Debridement Debridement N/A Procedures Performed: Treatment Notes Electronic Signature(s) Signed: 07/19/2022 3:27:07 PM By: Fredirick Maudlin MD FACS Entered By: Fredirick Maudlin on 07/19/2022 15:27:07 -------------------------------------------------------------------------------- Multi-Disciplinary Care Plan Details Patient Name: Date of Service: Edward [name], Edward Brandy MES [name]. 07/19/2022 1:15 PM Medical Record Number: [id number] Patient Account Number: [id number] Date of Birth/Sex: Treating RN: 05/18/1971 (51 y.o. [name] Primary Care Mashal Slavick: [name] Other Clinician: Referring Imara Standiford: Treating Rodneisha Bonnet/Extender: Kevan Ny in Treatment: [address], [place] [name] ([id number]) [id number] Page 5 of 14 Active Inactive Wound/Skin  Impairment Nursing Diagnoses: Impaired tissue integrity Knowledge deficit related to ulceration/compromised skin integrity Goals: Patient/caregiver will verbalize understanding of skin care regimen Date Initiated: 05/15/2022 Target Resolution Date: 10/18/2022 Goal Status: Active Ulcer/skin breakdown will have a volume reduction of 30% by week 4 Date Initiated: 05/15/2022 Target Resolution Date: 10/18/2022 Goal Status: Active Interventions: Assess patient/caregiver ability to obtain necessary supplies Assess patient/caregiver ability to perform ulcer/skin care regimen upon admission and as needed Assess ulceration(s) every visit Provide education on ulcer and skin care Notes: Electronic Signature(s) Signed: 07/19/2022 5:00:00 PM By: Dellie Catholic RN Entered By: Dellie Catholic on 07/19/2022 16:55:05 -------------------------------------------------------------------------------- Non-Wound Condition Assessment Details Patient Name: Date of Service: Edward [name] MES [name]. 07/19/2022 1:15 PM Medical Record Number: [id number] Patient Account Number: [id number] Date of Birth/Sex: Treating RN: 02-14-1971 (51 y.o. M) Primary Care Jaquise Faux: [name] Other Clinician: Referring Inocencio Roy: Treating Jossalin Chervenak/Extender: Kevan Ny in Treatment: 9 Non-Wound Condition: Condition: Other Dermatologic Condition Location: Foot Side: Left Periwound Skin Texture Texture Color No Abnormalities Noted: No No Abnormalities Noted: No Moisture No Abnormalities Noted: No Notes BLISTER Electronic Signature(s) Signed: 07/19/2022 2:26:25 PM By: Sandre Kitty Entered By: Sandre Kitty on 07/19/2022 13:45:51 [name] ([id number]) [id number] Page 6 of 14 -------------------------------------------------------------------------------- Pain Assessment Details Patient Name: Date of Service: Edward [name] MES [name]. 07/19/2022 1:15 PM Medical Record  Number: [id number] Patient Account Number: [id number] Date of Birth/Sex: Treating RN: 12/13/1970 (51 y.o. [name] Primary Care Cade Dashner: [name] Other Clinician: Referring Serina Nichter: Treating Prerana Strayer/Extender: Kevan Ny in Treatment: 9 Active Problems Location of Pain Severity and Description of Pain Patient Has Paino Yes Site Locations Pain Location: Generalized Pain With Dressing Change: Yes  Duration of the Pain. Constant / Intermittento Constant Rate the pain. Current Pain Level: 5 Worst Pain Level: 10 Least Pain Level: 5 Tolerable Pain Level: 5 Character of Pain Describe the Pain: Difficult to Pinpoint Pain Management and Medication Current Pain Management: Medication: Yes Cold Application: No Rest: Yes Massage: No Activity: No T.E.N.S.: No Heat Application: No Leg drop or elevation: No Is the Current Pain Management Adequate: Adequate How does your wound impact your activities of daily livingo Sleep: No Bathing: No Appetite: No Relationship With Others: No Bladder Continence: No Emotions: No Bowel Continence: No Work: No Toileting: No Drive: No Dressing: No Hobbies: No Electronic Signature(s) Signed: 07/19/2022 5:00:00 PM By: Dellie Catholic RN Entered By: Dellie Catholic on 07/19/2022 13:49:00 -------------------------------------------------------------------------------- Patient/Caregiver Education Details Patient Name: Date of Service: Edward [name] MES [name]. 12/1/2023andnbsp1:15 PM Medical Record Number: [id number] Patient Account Number: [id number] Date of Birth/Gender: Treating RN: 05/19/1971 (51 y.o. [name] Primary Care Physician: [name] Other Clinician: Referring Physician: Treating Physician/Extender: Kevan Ny in Treatment: [address] [place], [place] ([id number]) [id number] Page 7 of 14 Education Provided  To: Patient Education Topics Provided Wound/Skin Impairment: Methods: Explain/Verbal Responses: Return demonstration correctly Electronic Signature(s) Signed: 07/19/2022 5:00:00 PM By: Dellie Catholic RN Entered By: Dellie Catholic on 07/19/2022 16:55:20 -------------------------------------------------------------------------------- Wound Assessment Details Patient Name: Date of Service: Edward [name], Edward Brandy MES [name]. 07/19/2022 1:15 PM Medical Record Number: [id number] Patient Account Number: [id number] Date of Birth/Sex: Treating RN: Feb 28, 1971 (51 y.o. M) Primary Care Braelin Costlow: [name] Other Clinician: Referring Bleu Minerd: Treating Rheanna Sergent/Extender: Kevan Ny in Treatment: 9 Wound Status Wound Number: 13 Primary Diabetic Wound/Ulcer of the Lower Extremity Etiology: Wound Location: Left, Circumferential Lower Leg Wound Open Wounding Event: Gradually Appeared Status: Date Acquired: 06/18/2022 Comorbid Cataracts, Anemia, Sleep Apnea, Arrhythmia, Congestive Heart Weeks Of Treatment: 4 History: Failure, Coronary Artery Disease, Hypertension, Myocardial Clustered Wound: No Infarction, Peripheral Venous Disease, Type II Diabetes, End Stage Renal Disease, Osteoarthritis, Osteomyelitis, Neuropathy, Confinement Anxiety Photos Wound Measurements Length: (cm) 25.5 Width: (cm) 22 Depth: (cm) 0.3 Area: (cm) 440.608 Volume: (cm) 132.183 % Reduction in Area: -89.5% % Reduction in Volume: -468.6% Epithelialization: None Tunneling: No Undermining: No Wound Description Classification: Grade 2 Exudate Amount: Medium Exudate Type: Serosanguineous Exudate Color: red, brown Foul Odor After Cleansing: No Slough/Fibrino Yes Wound Bed Granulation Amount: Medium (34-66%) Exposed Structure Granulation Quality: Pink Fascia Exposed: No Necrotic Amount: Medium (34-66%) Fat Layer (Subcutaneous Tissue) Exposed: Yes Edward [name], Edward [name] ([id number])  [phone].pdf Page 8 of 14 Necrotic Quality: Eschar, Adherent Slough Tendon Exposed: No Muscle Exposed: No Joint Exposed: No Bone Exposed: No Periwound Skin Texture Texture Color No Abnormalities Noted: Yes No Abnormalities Noted: No Atrophie Blanche: No Moisture Cyanosis: No No Abnormalities Noted: No Ecchymosis: No Dry / Scaly: Yes Erythema: No Maceration: No Hemosiderin Staining: No Mottled: No Pallor: No Rubor: No Temperature / Pain Temperature: No Abnormality Treatment Notes Wound #13 (Lower Leg) Wound Laterality: Left, Circumferential Cleanser Soap and Water Discharge Instruction: May shower and wash wound with dial antibacterial soap and water prior to dressing change. Peri-Wound Care Sween Lotion (Moisturizing lotion) Discharge Instruction: Apply moisturizing lotion as directed Topical Primary Dressing IODOFLEX 0.9% Cadexomer Iodine Pad 4x6 cm Discharge Instruction: Use Iodosorb if Iodoflex not available Secondary Dressing ABD Pad, 5x9 Discharge Instruction: Apply over primary dressing as directed. Woven Gauze Sponge, Non-Sterile 4x4 in Discharge Instruction: Apply over primary dressing as directed. Secured With [employer], 4.5x3.1 (in/yd) Discharge  Instruction: Secure with Kerlix as directed. 48M Medipore H Soft Cloth Surgical T ape, 4 x 10 (in/yd) Discharge Instruction: Secure with tape as directed. Compression Wrap Compression Stockings Add-Ons Electronic Signature(s) Signed: 07/19/2022 2:26:25 PM By: Sandre Kitty Entered By: Sandre Kitty on 07/19/2022 13:46:09 -------------------------------------------------------------------------------- Wound Assessment Details Patient Name: Date of Service: Edward [name], Edward MES [name]. 07/19/2022 1:15 PM Medical Record Number: [id number] Patient Account Number: [id number] Date of Birth/Sex: Treating RN: [birth year] (51 y.o. M) Primary Care Sai Zinn: [name] Other  Clinician: Referring Dexton Zwilling: Treating Anina Schnake/Extender: Kevan Ny in Treatment: [address], [place] [name] ([id number]) [id number] Page 9 of 14 Wound Status Wound Number: 14 Primary Diabetic Wound/Ulcer of the Lower Extremity Etiology: Wound Location: Left, Dorsal Foot Wound Open Wounding Event: Gradually Appeared Status: Date Acquired: 06/18/2022 Comorbid Cataracts, Anemia, Sleep Apnea, Arrhythmia, Congestive Heart Weeks Of Treatment: 4 History: Failure, Coronary Artery Disease, Hypertension, Myocardial Clustered Wound: No Infarction, Peripheral Venous Disease, Type II Diabetes, End Stage Renal Disease, Osteoarthritis, Osteomyelitis, Neuropathy, Confinement Anxiety Photos Wound Measurements Length: (cm) 1.6 Width: (cm) 1.7 Depth: (cm) 0.2 Area: (cm) 2.136 Volume: (cm) 0.427 % Reduction in Area: -106% % Reduction in Volume: -106.3% Epithelialization: None Tunneling: No Undermining: No Wound Description Classification: Grade 2 Exudate Amount: Medium Exudate Type: Serosanguineous Exudate Color: red, brown Foul Odor After Cleansing: No Slough/Fibrino Yes Wound Bed Granulation Amount: Small (1-33%) Exposed Structure Granulation Quality: Pink Fascia Exposed: No Necrotic Amount: Large (67-100%) Fat Layer (Subcutaneous Tissue) Exposed: Yes Necrotic Quality: Eschar, Adherent Slough Tendon Exposed: No Muscle Exposed: No Joint Exposed: No Bone Exposed: No Periwound Skin Texture Texture Color No Abnormalities Noted: No No Abnormalities Noted: No Scarring: Yes Hemosiderin Staining: Yes Moisture Temperature / Pain No Abnormalities Noted: No Temperature: No Abnormality Maceration: Yes Treatment Notes Wound #14 (Foot) Wound Laterality: Dorsal, Left Cleanser Soap and Water Discharge Instruction: May shower and wash wound with dial antibacterial soap and water prior to dressing change. Peri-Wound Care Sween Lotion  (Moisturizing lotion) Discharge Instruction: Apply moisturizing lotion as directed Topical Primary Dressing IODOFLEX 0.9% Cadexomer Iodine Pad 4x6 cm Discharge Instruction: Use Iodosorb if Iodoflex not available [name], Edward ([id number]) [phone].pdf Page 10 of 14 Secondary Dressing ABD Pad, 5x9 Discharge Instruction: Apply over primary dressing as directed. Woven Gauze Sponge, Non-Sterile 4x4 in Discharge Instruction: Apply over primary dressing as directed. Secured With [employer], 4.5x3.1 (in/yd) Discharge Instruction: Secure with Kerlix as directed. 48M Medipore H Soft Cloth Surgical T ape, 4 x 10 (in/yd) Discharge Instruction: Secure with tape as directed. Compression Wrap Compression Stockings Add-Ons Electronic Signature(s) Signed: 07/19/2022 2:26:25 PM By: Sandre Kitty Entered By: Sandre Kitty on 07/19/2022 13:44:41 -------------------------------------------------------------------------------- Wound Assessment Details Patient Name: Date of Service: Edward [name], Edward MES [name]. 07/19/2022 1:15 PM Medical Record Number: [id number] Patient Account Number: [id number] Date of Birth/Sex: Treating RN: 11/25/1970 (51 y.o. M) Primary Care Ishitha Roper: [name] Other Clinician: Referring Keyari Kleeman: Treating Shree Espey/Extender: Kevan Ny in Treatment: 9 Wound Status Wound Number: 15 Primary Diabetic Wound/Ulcer of the Lower Extremity Etiology: Wound Location: Left, Proximal, Anterior Lower Leg Wound Open Wounding Event: Gradually Appeared Status: Date Acquired: 06/18/2022 Comorbid Cataracts, Anemia, Sleep Apnea, Arrhythmia, Congestive Heart Weeks Of Treatment: 4 History: Failure, Coronary Artery Disease, Hypertension, Myocardial Clustered Wound: No Infarction, Peripheral Venous Disease, Type II Diabetes, End Stage Renal Disease, Osteoarthritis, Osteomyelitis, Neuropathy, Confinement Anxiety Photos Wound  Measurements Length: (cm) 1 Width: (cm) 1 Depth: (cm) 0.1 Area: (cm) 0.785 Volume: (cm) 0.079 %  Reduction in Area: 66.7% % Reduction in Volume: 66.5% Tunneling: No Undermining: No Wound Description Classification: Grade 2 Exudate Amount: Medium Exudate Type: Serosanguineous Edward [name], Edward [name] ([id number]) Exudate Color: red, brown Foul Odor After Cleansing: No Slough/Fibrino Yes [phone] Page 11 of 14 Wound Bed Granulation Amount: Large (67-100%) Exposed Structure Granulation Quality: Pink Fascia Exposed: No Necrotic Amount: Small (1-33%) Fat Layer (Subcutaneous Tissue) Exposed: Yes Necrotic Quality: Eschar, Adherent Slough Tendon Exposed: No Muscle Exposed: No Joint Exposed: No Bone Exposed: No Periwound Skin Texture Texture Color No Abnormalities Noted: No No Abnormalities Noted: Yes Scarring: Yes Temperature / Pain Temperature: No Abnormality Moisture No Abnormalities Noted: Yes Treatment Notes Wound #15 (Lower Leg) Wound Laterality: Left, Anterior, Proximal Cleanser Soap and Water Discharge Instruction: May shower and wash wound with dial antibacterial soap and water prior to dressing change. Peri-Wound Care Sween Lotion (Moisturizing lotion) Discharge Instruction: Apply moisturizing lotion as directed Topical Primary Dressing IODOFLEX 0.9% Cadexomer Iodine Pad 4x6 cm Discharge Instruction: Use Iodosorb if Iodoflex not available Secondary Dressing ABD Pad, 5x9 Discharge Instruction: Apply over primary dressing as directed. Woven Gauze Sponge, Non-Sterile 4x4 in Discharge Instruction: Apply over primary dressing as directed. Secured With [employer], 4.5x3.1 (in/yd) Discharge Instruction: Secure with Kerlix as directed. 36M Medipore H Soft Cloth Surgical T ape, 4 x 10 (in/yd) Discharge Instruction: Secure with tape as directed. Compression Wrap Compression Stockings Add-Ons Electronic Signature(s) Signed: 07/19/2022  2:26:25 PM By: Sandre Kitty Entered By: Sandre Kitty on 07/19/2022 13:45:03 -------------------------------------------------------------------------------- Wound Assessment Details Patient Name: Date of Service: Edward [name], Edward MES [name]. 07/19/2022 1:15 PM Medical Record Number: [id number] Patient Account Number: [id number] Date of Birth/Sex: Treating RN: 1971-03-15 (51 y.o. M) Primary Care Anavi Branscum: [name] Other Clinician: Referring Jossiah Smoak: Treating Vedanshi Massaro/Extender: Kevan Ny in Treatment: [address] Avery [name] ([id number]) [id number] Page 12 of 14 Wound Status Wound Number: 16 Primary Diabetic Wound/Ulcer of the Lower Extremity Etiology: Wound Location: Left, Distal, Medial Lower Leg Wound Open Wounding Event: Gradually Appeared Status: Date Acquired: 06/18/2022 Comorbid Cataracts, Anemia, Sleep Apnea, Arrhythmia, Congestive Heart Weeks Of Treatment: 4 History: Failure, Coronary Artery Disease, Hypertension, Myocardial Clustered Wound: Yes Infarction, Peripheral Venous Disease, Type II Diabetes, End Stage Renal Disease, Osteoarthritis, Osteomyelitis, Neuropathy, Confinement Anxiety Photos Wound Measurements Length: (cm) 1.3 Width: (cm) 0.6 Depth: (cm) 0.1 Area: (cm) 0.613 Volume: (cm) 0.061 % Reduction in Area: 74% % Reduction in Volume: 74.2% Tunneling: No Undermining: No Wound Description Classification: Grade 2 Exudate Amount: Medium Exudate Type: Serosanguineous Exudate Color: red, brown Foul Odor After Cleansing: No Slough/Fibrino Yes Wound Bed Granulation Amount: Small (1-33%) Exposed Structure Granulation Quality: Pink Fascia Exposed: No Necrotic Amount: Large (67-100%) Fat Layer (Subcutaneous Tissue) Exposed: Yes Necrotic Quality: Eschar, Adherent Slough Tendon Exposed: No Muscle Exposed: No Joint Exposed: No Bone Exposed: No Periwound Skin Texture Texture Color No Abnormalities Noted:  Yes No Abnormalities Noted: Yes Moisture Temperature / Pain No Abnormalities Noted: Yes Temperature: No Abnormality Treatment Notes Wound #16 (Lower Leg) Wound Laterality: Left, Medial, Distal Cleanser Soap and Water Discharge Instruction: May shower and wash wound with dial antibacterial soap and water prior to dressing change. Peri-Wound Care Sween Lotion (Moisturizing lotion) Discharge Instruction: Apply moisturizing lotion as directed Topical Primary Dressing IODOFLEX 0.9% Cadexomer Iodine Pad 4x6 cm Discharge Instruction: Use Iodosorb if Iodoflex not available Secondary Dressing ABD Pad, 5x9 DAXTEN, Edward [name] ([id number]) [phone].pdf Page 13 of 14 Discharge Instruction: Apply over primary dressing as directed. Woven Gauze Sponge, Non-Sterile 4x4 in Discharge  Instruction: Apply over primary dressing as directed. Secured With [employer], 4.5x3.1 (in/yd) Discharge Instruction: Secure with Kerlix as directed. 39M Medipore H Soft Cloth Surgical T ape, 4 x 10 (in/yd) Discharge Instruction: Secure with tape as directed. Compression Wrap Compression Stockings Add-Ons Electronic Signature(s) Signed: 07/19/2022 2:26:25 PM By: Sandre Kitty Entered By: Sandre Kitty on 07/19/2022 13:45:24 -------------------------------------------------------------------------------- Wound Assessment Details Patient Name: Date of Service: Edward [name], Edward MES [name]. 07/19/2022 1:15 PM Medical Record Number: [id number] Patient Account Number: [id number] Date of Birth/Sex: Treating RN: 1970/09/19 (51 y.o. [name] Primary Care Nikkia Devoss: [name] Other Clinician: Referring Nadiyah Zeis: Treating Bob Daversa/Extender: Kevan Ny in Treatment: 9 Wound Status Wound Number: 17 Primary Diabetic Wound/Ulcer of the Lower Extremity Etiology: Wound Location: Left Amputation Site - Transmetatarsal Wound Open Wounding Event:  Blister Status: Date Acquired: 07/12/2022 Comorbid Cataracts, Anemia, Sleep Apnea, Arrhythmia, Congestive Heart Weeks Of Treatment: 0 History: Failure, Coronary Artery Disease, Hypertension, Myocardial Clustered Wound: No Infarction, Peripheral Venous Disease, Type II Diabetes, End Stage Renal Disease, Osteoarthritis, Osteomyelitis, Neuropathy, Confinement Anxiety Photos Wound Measurements Length: (cm) 2 Width: (cm) 2.5 Depth: (cm) 0.2 Area: (cm) 3.927 Volume: (cm) 0.785 % Reduction in Area: % Reduction in Volume: Epithelialization: None Tunneling: No Undermining: No Wound Description Classification: Grade 1 Exudate Amount: Medium Exudate Type: Serosanguineous Exudate Color: red, brown Edward [name], Edward [name] ([id number]) Wound Bed Granulation Amount: Small (1-33%) Granulation Quality: Red Necrotic Amount: Large (67-100%) Necrotic Quality: Eschar, Adherent Slough Foul Odor After Cleansing: No Slough/Fibrino Yes [phone].pdf Page 14 of 14 Exposed Structure Fascia Exposed: No Fat Layer (Subcutaneous Tissue) Exposed: Yes Tendon Exposed: No Muscle Exposed: No Joint Exposed: No Bone Exposed: No Periwound Skin Texture Texture Color No Abnormalities Noted: Yes No Abnormalities Noted: Yes Moisture Temperature / Pain No Abnormalities Noted: Yes Temperature: No Abnormality Treatment Notes Wound #17 (Amputation Site - Transmetatarsal) Wound Laterality: Left Cleanser Peri-Wound Care Topical Primary Dressing Secondary Dressing Secured With Compression Wrap Compression Stockings Add-Ons Electronic Signature(s) Signed: 07/19/2022 2:26:25 PM By: Sandre Kitty Signed: 07/19/2022 5:00:00 PM By: Dellie Catholic RN Entered By: Sandre Kitty on 07/19/2022 14:24:20 -------------------------------------------------------------------------------- Vitals Details Patient Name: Date of Service: Edward [name], Edward MES [name]. 07/19/2022 1:15 PM Medical Record Number:  [id number] Patient Account Number: [id number] Date of Birth/Sex: Treating RN: March 18, 1971 (51 y.o. [name] Primary Care Hidaya Daniel: [name] Other Clinician: Referring Nikky Duba: Treating Zacharius Funari/Extender: Cecile Hearing Weeks in Treatment: 9 Vital Signs Time Taken: 13:45 Temperature (F): 98.6 Pulse (bpm): 59 Respiratory Rate (breaths/min): 18 Blood Pressure (mmHg): 114/71 Reference Range: 80 - 120 mg / dl Electronic Signature(s) Signed: 07/19/2022 5:00:00 PM By: Dellie Catholic RN Entered By: Dellie Catholic on 07/19/2022 13:48:21

## 2022-07-19 NOTE — Progress Notes (Signed)
Edward, [name] ([id number]) [id number] Page 1 of 16 Visit Report for 07/19/2022 Chief Complaint Document Details Patient Name: Date of Service: Edward [name] MES L. 07/19/2022 1:15 PM Medical Record Number: [id number] Patient Account Number: [id number] Date of Birth/Sex: Treating RN: [birth year] (51 y.o. M) Primary Care Provider: [name] Other Clinician: Referring Provider: Treating Provider/Extender: Kevan Ny in Treatment: 9 Information Obtained from: Patient Chief Complaint Patients presents for treatment of an open diabetic ulcer Electronic Signature(s) Signed: 07/19/2022 3:27:19 PM By: Fredirick Maudlin MD FACS Entered By: Fredirick Maudlin on 07/19/2022 15:27:18 -------------------------------------------------------------------------------- Debridement Details Patient Name: Date of Service: Edward [name], [name] 07/19/2022 1:15 PM Medical Record Number: [id number] Patient Account Number: [id number] Date of Birth/Sex: Treating RN: 09-12-1970 (51 y.o. Edward [name] Primary Care Provider: [name] Other Clinician: Referring Provider: Treating Provider/Extender: Kevan Ny in Treatment: 9 Debridement Performed for Assessment: Wound #15 Left,Proximal,Anterior Lower Leg Performed By: Physician Fredirick Maudlin, MD Debridement Type: Debridement Severity of Tissue Pre Debridement: Fat layer exposed Level of Consciousness (Pre-procedure): Awake and Alert Pre-procedure Verification/Time Out Yes - 14:01 Taken: Start Time: 14:01 Pain Control: Lidocaine 5% topical ointment T Area Debrided (L x W): otal 1 (cm) x 1 (cm) = 1 (cm) Tissue and other material debrided: Non-Viable, Eschar, Slough, Slough Level: Non-Viable Tissue Debridement Description: Selective/Open Wound Instrument: Curette Bleeding: Minimum Hemostasis Achieved: Pressure End Time: 14:03 Procedural Pain: 0 Post Procedural  Pain: 0 Response to Treatment: Procedure was tolerated well Level of Consciousness (Post- Awake and Alert procedure): Post Debridement Measurements of Total Wound Length: (cm) 1 Width: (cm) 1 Depth: (cm) 0.1 Volume: (cm) 0.079 Character of Wound/Ulcer Post Debridement: Improved Severity of Tissue Post Debridement: Fat layer exposed ADELL, PANEK ([id number]) [phone].pdf Page 2 of 16 Post Procedure Diagnosis Same as Pre-procedure Notes Scribed for Dr. Celine Ahr by J.Scotton Electronic Signature(s) Signed: 07/19/2022 4:21:30 PM By: Fredirick Maudlin MD FACS Signed: 07/19/2022 5:00:00 PM By: Dellie Catholic RN Entered By: Dellie Catholic on 07/19/2022 14:03:52 -------------------------------------------------------------------------------- Debridement Details Patient Name: Date of Service: Edward [name], [name] 07/19/2022 1:15 PM Medical Record Number: [id number] Patient Account Number: [id number] Date of Birth/Sex: Treating RN: 09-27-1970 (51 y.o. Edward [name] Primary Care Provider: [name] Other Clinician: Referring Provider: Treating Provider/Extender: Kevan Ny in Treatment: 9 Debridement Performed for Assessment: Wound #13 Left,Circumferential Lower Leg Performed By: Physician Fredirick Maudlin, MD Debridement Type: Debridement Severity of Tissue Pre Debridement: Fat layer exposed Level of Consciousness (Pre-procedure): Awake and Alert Pre-procedure Verification/Time Out Yes - 14:01 Taken: Start Time: 14:01 Pain Control: Lidocaine 5% topical ointment T Area Debrided (L x W): otal 25.5 (cm) x 22 (cm) = 561 (cm) Tissue and other material debrided: Non-Viable, Slough, Subcutaneous, Slough, Other: Tendon Level: Skin/Subcutaneous Tissue Debridement Description: Excisional Instrument: Curette Bleeding: Minimum Hemostasis Achieved: Pressure End Time: 14:03 Procedural Pain: 0 Post Procedural Pain: 0 Response to  Treatment: Procedure was tolerated well Level of Consciousness (Post- Awake and Alert procedure): Post Debridement Measurements of Total Wound Length: (cm) 25.5 Width: (cm) 22 Depth: (cm) 0.3 Volume: (cm) 132.183 Character of Wound/Ulcer Post Debridement: Improved Severity of Tissue Post Debridement: Fat layer exposed Post Procedure Diagnosis Same as Pre-procedure Notes Scribed for Dr. Celine Ahr by J.Scotton Electronic Signature(s) Signed: 07/19/2022 4:21:30 PM By: Fredirick Maudlin MD FACS Signed: 07/19/2022 5:00:00 PM By: Dellie Catholic RN Entered By: Dellie Catholic on 07/19/2022 14:11:25 [name] [id number] Page 3 of 16 --------------------------------------------------------------------------------  Debridement Details Patient Name: Date of Service: Edward [name] MES L. 07/19/2022 1:15 PM Medical Record Number: [id number] Patient Account Number: [id number] Date of Birth/Sex: Treating RN: [birth year] (51 y.o. Edward [name] Primary Care Provider: [name] Other Clinician: Referring Provider: Treating Provider/Extender: Kevan Ny in Treatment: 9 Debridement Performed for Assessment: Wound #14 Left,Dorsal Foot Performed By: Physician Fredirick Maudlin, MD Debridement Type: Debridement Severity of Tissue Pre Debridement: Fat layer exposed Level of Consciousness (Pre-procedure): Awake and Alert Pre-procedure Verification/Time Out Yes - 14:01 Taken: Start Time: 14:01 Pain Control: Lidocaine 5% topical ointment T Area Debrided (L x W): otal 1.6 (cm) x 1.7 (cm) = 2.72 (cm) Tissue and other material debrided: Non-Viable, Slough, Subcutaneous, Slough Level: Skin/Subcutaneous Tissue Debridement Description: Excisional Instrument: Curette Bleeding: Minimum Hemostasis Achieved: Pressure End Time: 14:03 Procedural Pain: 0 Post Procedural Pain: 0 Response to Treatment: Procedure was tolerated  well Level of Consciousness (Post- Awake and Alert procedure): Post Debridement Measurements of Total Wound Length: (cm) 1.6 Width: (cm) 1.7 Depth: (cm) 0.2 Volume: (cm) 0.427 Character of Wound/Ulcer Post Debridement: Improved Severity of Tissue Post Debridement: Fat layer exposed Post Procedure Diagnosis Same as Pre-procedure Notes Scribed for Dr. Celine Ahr by J.Scotton Electronic Signature(s) Signed: 07/19/2022 4:21:30 PM By: Fredirick Maudlin MD FACS Signed: 07/19/2022 5:00:00 PM By: Dellie Catholic RN Entered By: Dellie Catholic on 07/19/2022 14:13:20 -------------------------------------------------------------------------------- Debridement Details Patient Name: Date of Service: Edward [name], [name] 07/19/2022 1:15 PM Medical Record Number: [id number] Patient Account Number: [id number] Date of Birth/Sex: Treating RN: Jul 16, 1971 (51 y.o. Edward [name] Primary Care Provider: [name] Other Clinician: Referring Provider: Treating Provider/Extender: Kevan Ny in Treatment: 9 Debridement Performed for Assessment: Wound #16 Left,Distal,Medial Lower Leg Performed By: Physician Fredirick Maudlin, MD Debridement Type: Debridement FINES, KIMBERLIN ([id number]) [id number] Page 4 of 16 Severity of Tissue Pre Debridement: Fat layer exposed Level of Consciousness (Pre-procedure): Awake and Alert Pre-procedure Verification/Time Out Yes - 14:01 Taken: Start Time: 14:01 Pain Control: Lidocaine 5% topical ointment T Area Debrided (L x W): otal 1.3 (cm) x 0.6 (cm) = 0.78 (cm) Tissue and other material debrided: Non-Viable, Slough, Subcutaneous, Slough Level: Skin/Subcutaneous Tissue Debridement Description: Excisional Instrument: Curette Bleeding: Minimum Hemostasis Achieved: Pressure End Time: 14:03 Procedural Pain: 0 Post Procedural Pain: 0 Response to Treatment: Procedure was tolerated well Level of Consciousness  (Post- Awake and Alert procedure): Post Debridement Measurements of Total Wound Length: (cm) 1.3 Width: (cm) 0.6 Depth: (cm) 0.1 Volume: (cm) 0.061 Character of Wound/Ulcer Post Debridement: Improved Severity of Tissue Post Debridement: Fat layer exposed Post Procedure Diagnosis Same as Pre-procedure Notes Scribed for Dr. Celine Ahr by J.Scotton Electronic Signature(s) Signed: 07/19/2022 4:21:30 PM By: Fredirick Maudlin MD FACS Signed: 07/19/2022 5:00:00 PM By: Dellie Catholic RN Entered By: Dellie Catholic on 07/19/2022 14:14:38 -------------------------------------------------------------------------------- Debridement Details Patient Name: Date of Service: Edward [name], [name] 07/19/2022 1:15 PM Medical Record Number: [id number] Patient Account Number: [id number] Date of Birth/Sex: Treating RN: 06-15-1971 (51 y.o. Edward [name] Primary Care Provider: [name] Other Clinician: Referring Provider: Treating Provider/Extender: Kevan Ny in Treatment: 9 Debridement Performed for Assessment: Wound #17 Left Amputation Site - Transmetatarsal Performed By: Physician Fredirick Maudlin, MD Debridement Type: Debridement Severity of Tissue Pre Debridement: Fat layer exposed Level of Consciousness (Pre-procedure): Awake and Alert Pre-procedure Verification/Time Out Yes - 14:01 Taken: Start Time: 14:01 Pain Control: Lidocaine 5% topical ointment T Area Debrided (L x W): otal 2 (  cm) x 2.5 (cm) = 5 (cm) Tissue and other material debrided: Non-Viable, Slough, Subcutaneous, Slough Level: Skin/Subcutaneous Tissue Debridement Description: Excisional Instrument: Curette Bleeding: Minimum Hemostasis Achieved: Pressure End Time: 14:03 Procedural Pain: 0 Post Procedural Pain: 0 Response to Treatment: Procedure was tolerated well Level of Consciousness (Post- Awake and Alert TRAVONTA, GILL ([id number]) [phone].pdf Page 5 of 16 Awake  and Alert procedure): Post Debridement Measurements of Total Wound Length: (cm) 2 Width: (cm) 2.5 Depth: (cm) 0.2 Volume: (cm) 0.785 Character of Wound/Ulcer Post Debridement: Improved Severity of Tissue Post Debridement: Fat layer exposed Post Procedure Diagnosis Same as Pre-procedure Notes Scribed for Dr. Celine Ahr by J.Scotton Electronic Signature(s) Signed: 07/19/2022 4:21:30 PM By: Fredirick Maudlin MD FACS Signed: 07/19/2022 5:00:00 PM By: Dellie Catholic RN Entered By: Dellie Catholic on 07/19/2022 14:45:39 -------------------------------------------------------------------------------- HPI Details Patient Name: Date of Service: Edward [name], [name] 07/19/2022 1:15 PM Medical Record Number: [id number] Patient Account Number: [id number] Date of Birth/Sex: Treating RN: 05-08-1971 (51 y.o. M) Primary Care Provider: [name] Other Clinician: Referring Provider: Treating Provider/Extender: Kevan Ny in Treatment: 9 History of Present Illness HPI Description: Admission 09/24/2021 Mr. 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 He developed cellulitis to the left fourth finger and was treated with doxycycline at urgent care. The nail is coming off. He reports taking the antibiotics currently. He has been keeping the area covered. He also presents with 2 left lower extremity wounds. He reports that the more proximal wound has been present for several months and the more distal wound has developed more recently over the past several weeks. He had an abdominal aortogram to the left lower extremity with laser atherectomy to the left popliteal, left tibioperoneal trunk and left peroneal arteries, drug-coated balloon angioplasty of the left popliteal artery and plain balloon  angioplasty of the left tibioperoneal trunk and left peroneal artery. He has not followed up with vein and vascular since his procedure on 04/09/2021. He currently denies signs of infection. He currently denies pain. 11/28/2021: The patient has not been seen since his initial admission. He has not followed up with vascular surgery, either. He did undergo amputation of his left fourth finger and is currently under the care of orthopedics and occupational therapy for this. He presents today with multiple wounds on his bilateral lower extremities. He is very fluid overloaded, based upon the appearance of his extremities. These all seem to have started as blisters that have subsequently opened and caused ulcers. He does have a shrinker sock for his right BKA, but he is unable to wear it secondary to pain. He also has a compression stocking for his left lower extremity but he has been unable to apply it due to the amputation on his left hand. The largest and most painful of these wounds is just above the knee on the right. All of the wounds have eschar and some slough present. No overt signs of infection. 12/05/2021: All of the wounds look about the same. He has new blisters that have opened up revealing denuded skin underneath. The eschar is extremely hard and where there is not eschar, the wounds are gritty. He was unable to tolerate the Ace wrap at the tightness that we applied it last week and ended up having to have it loosened. He has been tolerating  Kerlix and Coban on the left leg. He has not received a follow-up appointment with vascular surgery yet. We have been using Santyl under Hydrofera Blue. 12/12/2021: We have been unable to secure home health care assistance. The patient and his son are doing his wound care at home. He does have a follow-up visit in vascular surgery on May 24. He appears to be less fluid overloaded today. The wound on his right medial thigh looks bigger today and has a thick  layer of hard dark eschar. The wound on his lateral right stump looks somewhat improved with softer eschar and slough. There is also slough on the wound overlying the end of the stump and wrapping around to the backside of it. On his left leg, the edema control is improved. The wound on his anterior tibial surface is small and clean. On his dorsal and medial ankle, there is slough buildup. 12/19/2021: The wound on his left anterior tibial surface is closed. The wound on his dorsal left foot looks about the same with just a bit of slough buildup. The surface is gritty. The wound on his right medial thigh looks about the same today and has reaccumulated the hard dark eschar, despite vigorous debridement last week. The wound on the right lateral stump is about the same and has less eschar and slough. Overlying the end of the stump and wrapping around the backside of it, the wounds have converged. Overall, his edema control is much better than it has been and certainly than it was on the first visit. 12/26/2021: The wound on his dorsal left foot has a bit of slough accumulation but the surface is less gritty. The wound on his left medial calf is clean with a good bit of granulation tissue. The wounds on his right thigh and stump continue to display a heavy thick layer of black eschar. 01/01/2022: The wound on his dorsal left foot is fairly clean but still has a gritty surface. The wound on his left medial calf is nearly closed with just a little bit of overlying eschar. Unfortunately, the wounds on his right thigh and stump have deteriorated. 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 He has a fluctuant area near the proximal part of his medial thigh wound. There is an odor coming from that leg but I am not sure which of the wounds is producing it or if both of them are. KHAZA, BLANSETT ([id number]) [id number] Page 6 of  16 READMISSION 05/15/2022 Since he was last seen in our clinic, the patient has undergone multiple amputations of various fingers as well as revision of his right BKA to an above-knee amputation. He is now back for further evaluation and management of 3 small wounds on his left lower leg, just above the ankle on the anterior tibial surface. They are very fibrotic and dry. Muscle fibers are visible in the most proximal wound. There is slough and leathery eschar accumulation present. 06/20/2022: Mr. Falletta returns to clinic after having been in the hospital for quite some time. He has returned with multiple new wounds. The largest of these is a nearly circumferential wound on the mid to lower portion of his left lower leg. He has wounds on his Achilles tendon area with muscle and tendon exposed. The anterior ankle wound has thick black eschar accumulated on the surface. The dorsal foot wound also has slough and eschar buildup. All of the wound surfaces are incredibly fibrotic  and do not look particularly viable. 06/27/2022: All of the wounds are bigger this week, but most of them have an improved surface, with less fibrosis and even some pink coloration, particularly the circumferential leg wound. The anterior ankle wound has some tendon exposure underneath fibrinous slough in the proximal anterior leg wound remains fairly dry and tough. 07/05/2022: Cultures that I took were positive for Pseudomonas aeruginosa. I prescribed levofloxacin, but the patient did not fill or take it because he has been taking ciprofloxacin, prescribed by his orthopedic surgeon. All of the wound surfaces actually look better this week. There is a film that is stained yellow from the Iodoflex/Iodosorb. Still with extensive slough on all wound surfaces. He still has leathery eschar on his dorsal foot wound and the Achilles tendon is exposed and is quite desiccated. 07/19/2022: There is more pink tissue visible on his  circumferential leg wound. There is thick slough accumulation. The dorsal foot wound and the portion of the circumferential wound that involves the anterior ankle have tendon exposure and some necrotic fibrotic material. Despite this, most of the surfaces appear more viable. Electronic Signature(s) Signed: 07/19/2022 3:29:17 PM By: Fredirick Maudlin MD FACS Entered By: Fredirick Maudlin on 07/19/2022 15:29:17 -------------------------------------------------------------------------------- Physical Exam Details Patient Name: Date of Service: Edward [name], [name] 07/19/2022 1:15 PM Medical Record Number: [id number] Patient Account Number: [id number] Date of Birth/Sex: Treating RN: [birth year] (51 y.o. M) Primary Care Provider: [name] Other Clinician: Referring Provider: Treating Provider/Extender: Kevan Ny in Treatment: 9 Constitutional . Slightly bradycardic, asymptomatic. . . No acute distress. Respiratory Normal work of breathing on room air. Notes 07/19/2022: There is more pink tissue visible on his circumferential leg wound. There is thick slough accumulation. The dorsal foot wound and the portion of the circumferential wound that involves the anterior ankle have tendon exposure and some necrotic fibrotic material. Despite this, most of the surfaces appear more viable. He has a new wound near the distal portion of his residual foot, adjacent to the TMA scar. Electronic Signature(s) Signed: 07/19/2022 3:30:11 PM By: Fredirick Maudlin MD FACS Entered By: Fredirick Maudlin on 07/19/2022 15:30:11 -------------------------------------------------------------------------------- Physician Orders Details Patient Name: Date of Service: Edward [name], [name] 07/19/2022 1:15 PM Medical Record Number: [id number] Patient Account Number: [id number] Date of Birth/Sex: Treating RN: 1970-10-10 (51 y.o. 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 without evidence of necrosis N18.6 End stage renal disease Z89.611 Acquired absence of right leg above knee Z89.432 Acquired absence of left foot I50.42 Chronic combined systolic (congestive) and diastolic (congestive) heart failure I73.9 Peripheral vascular disease, unspecified E11.52 Type 2 diabetes mellitus with diabetic peripheral angiopathy with gangrene L97.522 Non-pressure chronic ulcer of other part of left foot with fat layer exposed L97.325 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 - Dr. Celine Ahr Room 3 Return A Bathing/ Shower/ Hygiene May shower and wash wound with soap and water. - use antibacterial soap to wash legs and wounds when changing dressing. right leg Edema Control - Lymphedema / SCD / Other  Elevate legs to the level of the heart or above for 30 minutes daily and/or when sitting, a frequency of: - throughout the day Avoid standing for long periods of time. Additional Orders / Instructions Follow Nutritious Diet - Monitor/Control Blood Sugars Wound Treatment Wound #13 - Lower Leg Wound Laterality: Left, Circumferential Cleanser: Soap and Water Every Other Day/30 Days Discharge Instructions: May shower and wash wound with dial antibacterial soap and water prior to dressing change. Peri-Wound Care: Sween Lotion (Moisturizing lotion) Every Other Day/30 Days Discharge Instructions: Apply moisturizing lotion as directed Prim  Dressing: IODOFLEX 0.9% Cadexomer Iodine Pad 4x6 cm (Generic) Every Other Day/30 Days ary Discharge Instructions: Use Iodosorb if Iodoflex not available Secondary Dressing: ABD Pad, 5x9 (Generic) Every Other Day/30 Days Discharge Instructions: Apply over primary dressing as directed. Secondary Dressing: Woven Gauze Sponge, Non-Sterile 4x4 in (Generic) Every Other Day/30 Days Discharge Instructions: Apply over primary dressing as directed. Secured With: [employer], 4.5x3.1 (in/yd) (Generic) Every Other Day/30 Days Discharge Instructions: Secure with Kerlix as directed. Secured With: 31M Medipore H Soft Cloth Surgical T ape, 4 x 10 (in/yd) (Generic) Every Other Day/30 Days Discharge Instructions: Secure with tape as directed. Wound #14 - Foot Wound Laterality: Dorsal, Left Cleanser: Soap and Water Every Other Day/30 Days Discharge Instructions: May shower and wash wound with dial antibacterial soap and water prior to dressing change. Peri-Wound Care: Sween Lotion (Moisturizing lotion) Every Other Day/30 Days Discharge Instructions: Apply moisturizing lotion as directed Prim Dressing: IODOFLEX 0.9% Cadexomer Iodine Pad 4x6 cm (Generic) Every Other Day/30 Days ary Discharge Instructions: Use Iodosorb if Iodoflex not available Secondary Dressing: ABD Pad, 5x9 (Generic) Every Other Day/30 Days Discharge Instructions: Apply over primary dressing as directed. Secondary Dressing: Woven Gauze Sponge, Non-Sterile 4x4 in (Generic) Every Other Day/30 Days Discharge Instructions: Apply over primary dressing as directed. Secured With: [employer], 4.5x3.1 (in/yd) (Generic) Every Other Day/30 Days Discharge Instructions: Secure with Kerlix as directed. Secured With: 31M [employer] H Soft Cloth Surgical T ape, 4 x 10 (in/yd) (Generic) Every Other Day/30 Days Discharge Instructions: Secure with tape as directed. LUCY, WOOLEVER ([id number]) [id number] Page 8 of  16 Wound #15 - Lower Leg Wound Laterality: Left, Anterior, Proximal Cleanser: Soap and Water Every Other Day/30 Days Discharge Instructions: May shower and wash wound with dial antibacterial soap and water prior to dressing change. Peri-Wound Care: Sween Lotion (Moisturizing lotion) Every Other Day/30 Days Discharge Instructions: Apply moisturizing lotion as directed Prim Dressing: IODOFLEX 0.9% Cadexomer Iodine Pad 4x6 cm (Generic) Every Other Day/30 Days ary Discharge Instructions: Use Iodosorb if Iodoflex not available Secondary Dressing: ABD Pad, 5x9 (Generic) Every Other Day/30 Days Discharge Instructions: Apply over primary dressing as directed. Secondary Dressing: Woven Gauze Sponge, Non-Sterile 4x4 in (Generic) Every Other Day/30 Days Discharge Instructions: Apply over primary dressing as directed. Secured With: [employer], 4.5x3.1 (in/yd) (Generic) Every Other Day/30 Days Discharge Instructions: Secure with Kerlix as directed. Secured With: 31M Medipore H Soft Cloth Surgical T ape, 4 x 10 (in/yd) (Generic) Every Other Day/30 Days Discharge Instructions: Secure with tape as directed. Wound #16 - Lower Leg Wound Laterality: Left, Medial, Distal Cleanser: Soap and Water Every Other Day/30 Days Discharge Instructions: May shower and wash wound with dial antibacterial soap and water prior to dressing change. Peri-Wound Care: Sween Lotion (Moisturizing lotion) Every Other Day/30 Days Discharge Instructions: Apply moisturizing lotion as directed Prim Dressing: IODOFLEX 0.9% Cadexomer Iodine Pad 4x6 cm (Generic) Every Other Day/30 Days ary Discharge Instructions: Use Iodosorb  if Iodoflex not available Secondary Dressing: ABD Pad, 5x9 (Generic) Every Other Day/30 Days Discharge Instructions: Apply over primary dressing as directed. Secondary Dressing: Woven Gauze Sponge, Non-Sterile 4x4 in (Generic) Every Other Day/30 Days Discharge Instructions: Apply over primary dressing as  directed. Secured With: [employer], 4.5x3.1 (in/yd) (Generic) Every Other Day/30 Days Discharge Instructions: Secure with Kerlix as directed. Secured With: 15M Medipore H Soft Cloth Surgical T ape, 4 x 10 (in/yd) (Generic) Every Other Day/30 Days Discharge Instructions: Secure with tape as directed. Electronic Signature(s) Signed: 07/19/2022 4:21:30 PM By: Fredirick Maudlin MD FACS Entered By: Fredirick Maudlin on 07/19/2022 15:30:39 -------------------------------------------------------------------------------- Problem List Details Patient Name: Date of Service: Edward [name], [name] 07/19/2022 1:15 PM Medical Record Number: [id number] Patient Account Number: [id number] Date of Birth/Sex: Treating RN: 11/18/1970 (51 y.o. 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 05/15/2022 No Yes involvement without evidence of necrosis JOHNCARLO, MAALOUF ([id number]) [phone].pdf Page 9 of 16 N18.6 End stage renal disease 05/15/2022 No Yes Z89.611 Acquired absence of right leg above knee 05/15/2022 No Yes Z89.432 Acquired absence of left foot 05/15/2022 No Yes I50.42 Chronic combined systolic (congestive) and diastolic (congestive) heart failure 05/15/2022 No Yes I73.9 Peripheral vascular disease, unspecified 05/15/2022 No Yes E11.52 Type 2 diabetes mellitus with diabetic peripheral angiopathy with gangrene 05/15/2022 No Yes L97.522 Non-pressure chronic ulcer of other part of left foot with fat layer exposed 06/20/2022 No Yes L97.325 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 07/19/2022 1:15 PM Medical Record Number: [id number] Patient Account Number: [id number] Date of Birth/Sex: Treating RN: 05/09/1971 (51 y.o. M) Primary Care Provider: [name] Other Clinician: Referring Provider: Treating Provider/Extender: Kevan Ny in Treatment: 9 Subjective Chief Complaint Information obtained from Patient Patients presents for treatment of an open diabetic ulcer History of Present Illness (HPI) Admission 09/24/2021 Mr. Martie Fulgham is a 51 year old male with a past medical history of foreign years gangrene, type 2 diabetes, end-stage renal disease on hemodialysis, right BKA, left transmetatarsal amputation that presents to the clinic for 3 wounds. He developed cellulitis to the left fourth finger and was treated with doxycycline GOKU, HARB ([id number]) [phone].pdf Page 10 of 16 at urgent care. The nail is coming off. He reports taking the antibiotics currently. He has been keeping the area covered. He also presents with 2 left lower extremity wounds. He reports that the more proximal wound has been present for several months and the more distal wound has developed more recently over the past several weeks. He had an abdominal aortogram to the left lower extremity with laser atherectomy to the left popliteal, left tibioperoneal trunk and left peroneal arteries, drug-coated balloon angioplasty of the left popliteal artery and plain balloon angioplasty of the left tibioperoneal trunk and left peroneal artery. He has not followed up with vein and vascular  since his procedure on 04/09/2021. He currently denies signs of infection. He  currently denies pain. 11/28/2021: The patient has not been seen since his initial admission. He has not followed up with vascular surgery, either. He did undergo amputation of his left fourth finger and is currently under the care of orthopedics and occupational therapy for this. He presents today with multiple wounds on his bilateral lower extremities. He is very fluid overloaded, based upon the appearance of his extremities. These all seem to have started as blisters that have subsequently opened and caused ulcers. He does have a shrinker sock for his right BKA, but he is unable to wear it secondary to pain. He also has a compression stocking for his left lower extremity but he has been unable to apply it due to the amputation on his left hand. The largest and most painful of these wounds is just above the knee on the right. All of the wounds have eschar and some slough present. No overt signs of infection. 12/05/2021: All of the wounds look about the same. He has new blisters that have opened up revealing denuded skin underneath. The eschar is extremely hard and where there is not eschar, the wounds are gritty. He was unable to tolerate the Ace wrap at the tightness that we applied it last week and ended up having to have it loosened. He has been tolerating Kerlix and Coban on the left leg. He has not received a follow-up appointment with vascular surgery yet. We have been using Santyl under Hydrofera Blue. 12/12/2021: We have been unable to secure home health care assistance. The patient and his son are doing his wound care at home. He does have a follow-up visit in vascular surgery on May 24. He appears to be less fluid overloaded today. The wound on his right medial thigh looks bigger today and has a thick layer of hard dark eschar. The wound on his lateral right stump looks somewhat improved with softer eschar and slough.  There is also slough on the wound overlying the end of the stump and wrapping around to the backside of it. On his left leg, the edema control is improved. The wound on his anterior tibial surface is small and clean. On his dorsal and medial ankle, there is slough buildup. 12/19/2021: The wound on his left anterior tibial surface is closed. The wound on his dorsal left foot looks about the same with just a bit of slough buildup. The surface is gritty. The wound on his right medial thigh looks about the same today and has reaccumulated the hard dark eschar, despite vigorous debridement last week. The wound on the right lateral stump is about the same and has less eschar and slough. Overlying the end of the stump and wrapping around the backside of it, the wounds have converged. Overall, his edema control is much better than it has been and certainly than it was on the first visit. 12/26/2021: The wound on his dorsal left foot has a bit of slough accumulation but the surface is less gritty. The wound on his left medial calf is clean with a good bit of granulation tissue. The wounds on his right thigh and stump continue to display a heavy thick layer of black eschar. 01/01/2022: The wound on his dorsal left foot is fairly clean but still has a gritty surface. The wound on his left medial calf is nearly closed with just a little bit of overlying eschar. Unfortunately, the wounds on his right thigh and stump have deteriorated. The distal portion of his BKA stump is  white and cold and appears to be completely nonviable. He has a fluctuant area near the proximal part of his medial thigh wound. There is an odor coming from that leg but I am not sure which of the wounds is producing it or if both of them are. 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 He is now back  for further evaluation and management of 3 small wounds on his left lower leg, just above the ankle on the anterior tibial surface. They are very fibrotic and dry. Muscle fibers are visible in the most proximal wound. There is slough and leathery eschar accumulation present. 06/20/2022: Mr. Tuccillo returns to clinic after having been in the hospital for quite some time. He has returned with multiple new wounds. The largest of these is a nearly circumferential wound on the mid to lower portion of his left lower leg. He has wounds on his Achilles tendon area with muscle and tendon exposed. The anterior ankle wound has thick black eschar accumulated on the surface. The dorsal foot wound also has slough and eschar buildup. All of the wound surfaces are incredibly fibrotic and do not look particularly viable. 06/27/2022: All of the wounds are bigger this week, but most of them have an improved surface, with less fibrosis and even some pink coloration, particularly the circumferential leg wound. The anterior ankle wound has some tendon exposure underneath fibrinous slough in the proximal anterior leg wound remains fairly dry and tough. 07/05/2022: Cultures that I took were positive for Pseudomonas aeruginosa. I prescribed levofloxacin, but the patient did not fill or take it because he has been taking ciprofloxacin, prescribed by his orthopedic surgeon. All of the wound surfaces actually look better this week. There is a film that is stained yellow from the Iodoflex/Iodosorb. Still with extensive slough on all wound surfaces. He still has leathery eschar on his dorsal foot wound and the Achilles tendon is exposed and is quite desiccated. 07/19/2022: There is more pink tissue visible on his circumferential leg wound. There is thick slough accumulation. The dorsal foot wound and the portion of the circumferential wound that involves the anterior ankle have tendon exposure and some necrotic fibrotic material.  Despite this, most of the surfaces appear more viable. Patient History Information obtained from Patient, Chart. Family History Cancer - Mother, Diabetes - Siblings, Heart Disease - Mother,Father, Kidney Disease - Siblings,Father, No family history of Hereditary Spherocytosis, Hypertension, Lung Disease, Seizures, Stroke, Thyroid Problems, Tuberculosis. Social History Former smoker - Quit 2 years ago, Marital Status - Widowed, Alcohol Use - Rarely, Drug Use - No History, Caffeine Use - Moderate - coffee. Medical History Eyes Patient has history of Cataracts - Surgery Denies history of Glaucoma, Optic Neuritis Hematologic/Lymphatic Patient has history of Anemia Respiratory Patient has history of Sleep Apnea Cardiovascular Patient has history of Arrhythmia - junctional rhythm, Congestive Heart Failure, Coronary Artery Disease, Hypertension, Myocardial Infarction, Peripheral Venous Disease Endocrine Patient has history of Type II Diabetes Denies history of Type I Diabetes Genitourinary Patient has history of End Stage Renal Disease - Dialysis Integumentary (Skin) Denies history of History of Burn Musculoskeletal ALEXSANDRO, SALEK ([id number]) [phone].pdf Page 11 of 16 Patient has history of Osteoarthritis, Osteomyelitis Denies history of Gout, Rheumatoid Arthritis Neurologic Patient has history of Neuropathy Oncologic Denies history of Received Chemotherapy, Received Radiation Psychiatric Patient has history of Confinement Anxiety Denies  history of Anorexia/bulimia Hospitalization/Surgery History - amputation left index finger 11/15/21. - debridement and closure left ring finger. - peripheral vascular atherectomy 02/16/15. - left bascillic vein transposition 12/1 21. - revision of AV fistula left 05/24/20. - AV fistula placement left 11/25/19. - coronary stent intervention 12/11 19. - right BKA 06/19/18. - total left hip arthroplasty 10/11/16. - left transmet  amp 05/10/16. - amputation above knee R 01/08/22. - amputauoin left index finger 01/12/22. - amputation right index finger. Medical A Surgical History Notes nd Cardiovascular cardiomyopathy Neurologic TIA Objective Constitutional Slightly bradycardic, asymptomatic. No acute distress. Vitals Time Taken: 1:45 PM, Temperature: 98.6 F, Pulse: 59 bpm, Respiratory Rate: 18 breaths/min, Blood Pressure: 114/71 mmHg. Respiratory Normal work of breathing on room air. General Notes: 07/19/2022: There is more pink tissue visible on his circumferential leg wound. There is thick slough accumulation. The dorsal foot wound and the portion of the circumferential wound that involves the anterior ankle have tendon exposure and some necrotic fibrotic material. Despite this, most of the surfaces appear more viable. He has a new wound near the distal portion of his residual foot, adjacent to the TMA scar. Integumentary (Hair, Skin) Wound #13 status is Open. Original cause of wound was Gradually Appeared. The date acquired was: 06/18/2022. The wound has been in treatment 4 weeks. The wound is located on the Left,Circumferential Lower Leg. The wound measures 25.5cm length x 22cm width x 0.3cm depth; 440.608cm^2 area and 132.183cm^3 volume. There is Fat Layer (Subcutaneous Tissue) exposed. There is no tunneling or undermining noted. There is a medium amount of serosanguineous drainage noted. There is medium (34-66%) pink granulation within the wound bed. There is a medium (34-66%) amount of necrotic tissue within the wound bed including Eschar and Adherent Slough. The periwound skin appearance had no abnormalities noted for texture. The periwound skin appearance exhibited: Dry/Scaly. The periwound skin appearance did not exhibit: Maceration, Atrophie Blanche, Cyanosis, Ecchymosis, Hemosiderin Staining, Mottled, Pallor, Rubor, Erythema. Periwound temperature was noted as No Abnormality. Wound #14 status is Open.  Original cause of wound was Gradually Appeared. The date acquired was: 06/18/2022. The wound has been in treatment 4 weeks. The wound is located on the Left,Dorsal Foot. The wound measures 1.6cm length x 1.7cm width x 0.2cm depth; 2.136cm^2 area and 0.427cm^3 volume. There is Fat Layer (Subcutaneous Tissue) exposed. There is no tunneling or undermining noted. There is a medium amount of serosanguineous drainage noted. There is small (1-33%) pink granulation within the wound bed. There is a large (67-100%) amount of necrotic tissue within the wound bed including Eschar and Adherent Slough. The periwound skin appearance exhibited: Scarring, Maceration, Hemosiderin Staining. Periwound temperature was noted as No Abnormality. Wound #15 status is Open. Original cause of wound was Gradually Appeared. The date acquired was: 06/18/2022. The wound has been in treatment 4 weeks. The wound is located on the Left,Proximal,Anterior Lower Leg. The wound measures 1cm length x 1cm width x 0.1cm depth; 0.785cm^2 area and 0.079cm^3 volume. There is Fat Layer (Subcutaneous Tissue) exposed. There is no tunneling or undermining noted. There is a medium amount of serosanguineous drainage noted. There is large (67-100%) pink granulation within the wound bed. There is a small (1-33%) amount of necrotic tissue within the wound bed including Eschar and Adherent Slough. The periwound skin appearance had no abnormalities noted for moisture. The periwound skin appearance had no abnormalities noted for color. The periwound skin appearance exhibited: Scarring. Periwound temperature was noted as No Abnormality. Wound #16 status is Open. Original cause  of wound was Gradually Appeared. The date acquired was: 06/18/2022. The wound has been in treatment 4 weeks. The wound is located on the Left,Distal,Medial Lower Leg. The wound measures 1.3cm length x 0.6cm width x 0.1cm depth; 0.613cm^2 area and 0.061cm^3 volume. There is Fat Layer  (Subcutaneous Tissue) exposed. There is no tunneling or undermining noted. There is a medium amount of serosanguineous drainage noted. There is small (1-33%) pink granulation within the wound bed. There is a large (67-100%) amount of necrotic tissue within the wound bed including Eschar and Adherent Slough. The periwound skin appearance had no abnormalities noted for texture. The periwound skin appearance had no abnormalities noted for moisture. The periwound skin appearance had no abnormalities noted for color. Periwound temperature was noted as No Abnormality. Wound #17 status is Open. Original cause of wound was Blister. The date acquired was: 07/12/2022. The wound is located on the Left Amputation Site - Transmetatarsal. The wound measures 2cm length x 2.5cm width x 0.2cm depth; 3.927cm^2 area and 0.785cm^3 volume. There is Fat Layer (Subcutaneous Tissue) exposed. There is no tunneling or undermining noted. There is a medium amount of serosanguineous drainage noted. There is small (1-33%) red granulation within the wound bed. There is a large (67-100%) amount of necrotic tissue within the wound bed including Eschar and Adherent Slough. The periwound skin appearance had no abnormalities noted for texture. The periwound skin appearance had no abnormalities noted for moisture. The periwound skin appearance had no abnormalities noted for color. Periwound temperature was noted as No Abnormality. 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 There was a Excisional Skin/Subcutaneous Tissue Debridement with a total area of 561 sq cm performed by Fredirick Maudlin, MD. With the following instrument(s): Curette to remove Non-Viable tissue/material. Material removed includes Subcutaneous Tissue, Slough, and Other: T endon after achieving pain control using Lidocaine 5% topical ointment. No specimens were taken. A time out was conducted at 14:01, prior to the start of the procedure. A Minimum amount of bleeding was controlled with Pressure. The procedure was tolerated well with a pain level of 0 throughout and a pain level of 0 following the procedure. Post Debridement Measurements: 25.5cm length x 22cm width x 0.3cm depth; 132.183cm^3 volume. Character of Wound/Ulcer Post Debridement is improved. Severity of Tissue Post Debridement is: Fat layer exposed. Post procedure Diagnosis Wound #13: Same as Pre-Procedure General Notes: Scribed for Dr. Celine Ahr by J.Scotton. Wound #14 Pre-procedure diagnosis of Wound #14 is a Diabetic Wound/Ulcer of the Lower Extremity located on the Left,Dorsal Foot .Severity of Tissue Pre Debridement is: Fat layer exposed. There was a Excisional Skin/Subcutaneous Tissue Debridement with a total area of 2.72 sq cm performed by Fredirick Maudlin, MD. With the following instrument(s): Curette to  remove Non-Viable tissue/material. Material removed includes Subcutaneous Tissue and Slough and after achieving pain control using Lidocaine 5% topical ointment. No  specimens were taken. A time out was conducted at 14:01, prior to the start of the procedure. A Minimum amount of bleeding was controlled with Pressure. The procedure was tolerated well with a pain level of 0 throughout and a pain level of 0 following the procedure. Post Debridement Measurements: 1.6cm length x 1.7cm width x 0.2cm depth; 0.427cm^3 volume. Character of Wound/Ulcer Post Debridement is improved. Severity of Tissue Post Debridement is: Fat layer exposed. Post procedure Diagnosis Wound #14: Same as Pre-Procedure General Notes: Scribed for Dr. Celine Ahr by J.Scotton. Wound #15 Pre-procedure diagnosis of Wound #15 is a Diabetic Wound/Ulcer of the Lower Extremity located on the Left,Proximal,Anterior Lower Leg .Severity of Tissue Pre Debridement is: Fat layer exposed. There was a Selective/Open Wound Non-Viable Tissue Debridement with a total area of 1 sq cm performed by Fredirick Maudlin, MD. With the following instrument(s): Curette to remove Non-Viable tissue/material. Material removed includes Eschar and Slough and after achieving pain control using Lidocaine 5% topical ointment. No specimens were taken. A time out was conducted at 14:01, prior to the start of the procedure. A Minimum amount of bleeding was controlled with Pressure. The procedure was tolerated well with a pain level of 0 throughout and a pain level of 0 following the procedure. Post Debridement Measurements: 1cm length x 1cm width x 0.1cm depth; 0.079cm^3 volume. Character of Wound/Ulcer Post Debridement is improved. Severity of Tissue Post Debridement is: Fat layer exposed. Post procedure Diagnosis Wound #15: Same as Pre-Procedure General Notes: Scribed for Dr. Celine Ahr by J.Scotton. Wound #16 Pre-procedure diagnosis of Wound #16 is a Diabetic Wound/Ulcer of the Lower Extremity located on the Left,Distal,Medial Lower Leg .Severity of Tissue Pre Debridement is: Fat layer exposed. There was a Excisional Skin/Subcutaneous  Tissue Debridement with a total area of 0.78 sq cm performed by Fredirick Maudlin, MD. With the following instrument(s): Curette to remove Non-Viable tissue/material. Material removed includes Subcutaneous Tissue and Slough and after achieving pain control using Lidocaine 5% topical ointment. No specimens were taken. A time out was conducted at 14:01, prior to the start of the procedure. A Minimum amount of bleeding was controlled with Pressure. The procedure was tolerated well with a pain level of 0 throughout and a pain level of 0 following the procedure. Post Debridement Measurements: 1.3cm length x 0.6cm width x 0.1cm depth; 0.061cm^3 volume. Character of Wound/Ulcer Post Debridement is improved. Severity of Tissue Post Debridement is: Fat layer exposed. Post procedure Diagnosis Wound #16: Same as Pre-Procedure General Notes: Scribed for Dr. Celine Ahr by J.Scotton. Wound #17 Pre-procedure diagnosis of Wound #17 is a Diabetic Wound/Ulcer of the Lower Extremity located on the Left Amputation Site - Transmetatarsal .Severity of Tissue Pre Debridement is: Fat layer exposed. There was a Excisional Skin/Subcutaneous Tissue Debridement with a total area of 5 sq cm performed by Fredirick Maudlin, MD. With the following instrument(s): Curette to remove Non-Viable tissue/material. Material removed includes Subcutaneous Tissue and Slough and after achieving pain control using Lidocaine 5% topical ointment. No specimens were taken. A time out was conducted at 14:01, prior to the start of the procedure. A Minimum amount of bleeding was controlled with Pressure. The procedure was tolerated well with a pain level of 0 throughout and a pain level of 0 following the procedure. Post Debridement Measurements: 2cm length x 2.5cm width x 0.2cm depth; 0.785cm^3 volume. Character of Wound/Ulcer Post Debridement is improved. Severity  of Tissue Post Debridement is: Fat layer exposed. Post procedure Diagnosis Wound #17: Same  as Pre-Procedure General Notes: Scribed for Dr. Celine Ahr by J.Scotton. Plan Follow-up Appointments: Return Appointment in 2 weeks. - Dr. Celine Ahr Room 3 Bathing/ Shower/ Hygiene: May shower and wash wound with soap and water. - use antibacterial soap to wash legs and wounds when changing dressing. right leg Edema Control - Lymphedema / SCD / Other: Elevate legs to the level of the heart or above for 30 minutes daily and/or when sitting, a frequency of: - throughout the day Avoid standing for long periods of time. Additional Orders / Instructions: Follow Nutritious Diet - Monitor/Control Blood Sugars WOUND #13: - Lower Leg Wound Laterality: Left, Circumferential IVORY, BAIL ([id number]) [phone].pdf Page 13 of 16 Cleanser: Soap and Water Every Other Day/30 Days Discharge Instructions: May shower and wash wound with dial antibacterial soap and water prior to dressing change. Peri-Wound Care: Sween Lotion (Moisturizing lotion) Every Other Day/30 Days Discharge Instructions: Apply moisturizing lotion as directed Prim Dressing: IODOFLEX 0.9% Cadexomer Iodine Pad 4x6 cm (Generic) Every Other Day/30 Days ary Discharge Instructions: Use Iodosorb if Iodoflex not available Secondary Dressing: ABD Pad, 5x9 (Generic) Every Other Day/30 Days Discharge Instructions: Apply over primary dressing as directed. Secondary Dressing: Woven Gauze Sponge, Non-Sterile 4x4 in (Generic) Every Other Day/30 Days Discharge Instructions: Apply over primary dressing as directed. Secured With: [employer], 4.5x3.1 (in/yd) (Generic) Every Other Day/30 Days Discharge Instructions: Secure with Kerlix as directed. Secured With: 35M Medipore H Soft Cloth Surgical T ape, 4 x 10 (in/yd) (Generic) Every Other Day/30 Days Discharge Instructions: Secure with tape as directed. WOUND #14: - Foot Wound Laterality: Dorsal, Left Cleanser: Soap and Water Every Other Day/30 Days Discharge Instructions:  May shower and wash wound with dial antibacterial soap and water prior to dressing change. Peri-Wound Care: Sween Lotion (Moisturizing lotion) Every Other Day/30 Days Discharge Instructions: Apply moisturizing lotion as directed Prim Dressing: IODOFLEX 0.9% Cadexomer Iodine Pad 4x6 cm (Generic) Every Other Day/30 Days ary Discharge Instructions: Use Iodosorb if Iodoflex not available Secondary Dressing: ABD Pad, 5x9 (Generic) Every Other Day/30 Days Discharge Instructions: Apply over primary dressing as directed. Secondary Dressing: Woven Gauze Sponge, Non-Sterile 4x4 in (Generic) Every Other Day/30 Days Discharge Instructions: Apply over primary dressing as directed. Secured With: [employer], 4.5x3.1 (in/yd) (Generic) Every Other Day/30 Days Discharge Instructions: Secure with Kerlix as directed. Secured With: 35M Med[employer] H Soft Cloth Surgical T ape, 4 x 10 (in/yd) (Generic) Every Other Day/30 Days Discharge Instructions: Secure with tape as directed. WOUND #15: - Lower Leg Wound Laterality: Left, Anterior, Proximal Cleanser: Soap and Water Every Other Day/30 Days Discharge Instructions: May shower and wash wound with dial antibacterial soap and water prior to dressing change. Peri-Wound Care: Sween Lotion (Moisturizing lotion) Every Other Day/30 Days Discharge Instructions: Apply moisturizing lotion as directed Prim Dressing: IODOFLEX 0.9% Cadexomer Iodine Pad 4x6 cm (Generic) Every Other Day/30 Days ary Discharge Instructions: Use Iodosorb if Iodoflex not available Secondary Dressing: ABD Pad, 5x9 (Generic) Every Other Day/30 Days Discharge Instructions: Apply over primary dressing as directed. Secondary Dressing: Woven Gauze Sponge, Non-Sterile 4x4 in (Generic) Every Other Day/30 Days Discharge Instructions: Apply over primary dressing as directed. Secured With: [employer], 4.5x3.1 (in/yd) (Generic) Every Other Day/30 Days Discharge Instructions: Secure with Kerlix as  directed. Secured With: 35M Medipore H Soft Cloth Surgical T ape, 4 x 10 (in/yd) (Generic) Every Other Day/30 Days Discharge Instructions: Secure with tape as directed. WOUND #  16: - Lower Leg Wound Laterality: Left, Medial, Distal Cleanser: Soap and Water Every Other Day/30 Days Discharge Instructions: May shower and wash wound with dial antibacterial soap and water prior to dressing change. Peri-Wound Care: Sween Lotion (Moisturizing lotion) Every Other Day/30 Days Discharge Instructions: Apply moisturizing lotion as directed Prim Dressing: IODOFLEX 0.9% Cadexomer Iodine Pad 4x6 cm (Generic) Every Other Day/30 Days ary Discharge Instructions: Use Iodosorb if Iodoflex not available Secondary Dressing: ABD Pad, 5x9 (Generic) Every Other Day/30 Days Discharge Instructions: Apply over primary dressing as directed. Secondary Dressing: Woven Gauze Sponge, Non-Sterile 4x4 in (Generic) Every Other Day/30 Days Discharge Instructions: Apply over primary dressing as directed. Secured With: [employer], 4.5x3.1 (in/yd) (Generic) Every Other Day/30 Days Discharge Instructions: Secure with Kerlix as directed. Secured With: 52M Medipore H Soft Cloth Surgical T ape, 4 x 10 (in/yd) (Generic) Every Other Day/30 Days Discharge Instructions: Secure with tape as directed. 07/19/2022: There is more pink tissue visible on his circumferential leg wound. There is thick slough accumulation. The dorsal foot wound and the portion of the circumferential wound that involves the anterior ankle have tendon exposure and some necrotic fibrotic material. Despite this, most of the surfaces appear more viable. He has a new wound near the distal portion of his residual foot, adjacent to the TMA scar. I used a curette to debride slough, nonviable subcutaneous tissue, necrotic muscle, and some nonviable tendon from the circumferential leg wound. I also debrided slough and nonviable subcutaneous tissue from the dorsal foot  wound. I debrided slough and eschar from the new transmetatarsal amputation area wound. I debrided slough and eschar from the more proximal anterior tibial wound. I think the Iodoflex/Iodosorb is doing a good job of chemically debriding the nonviable tissue so that it can be more easily removed at his clinic visits. We will continue this and he will follow-up in 1 week. I am hopeful that we will be able to transition to a dressing such as Hydrofera Blue or silver alginate in the next week or 2. Electronic Signature(s) Signed: 07/19/2022 3:32:31 PM By: Fredirick Maudlin MD FACS Entered By: Fredirick Maudlin on 07/19/2022 15:32:30 [name] ([id number]) [id number].pdf Page 14 of 16 -------------------------------------------------------------------------------- HxROS Details Patient Name: Date of Service: Edward [name] MES L. 07/19/2022 1:15 PM Medical Record Number: [id number] Patient Account Number: [id number] Date of Birth/Sex: Treating RN: Jul 01, 1971 (51 y.o. M) Primary Care Provider: [name] Other Clinician: Referring Provider: Treating Provider/Extender: Kevan Ny in Treatment: 9 Information Obtained From Patient Chart Eyes Medical History: Positive for: Cataracts - Surgery Negative for: Glaucoma; Optic Neuritis Hematologic/Lymphatic Medical History: Positive for: Anemia Respiratory Medical History: Positive for: Sleep Apnea Cardiovascular Medical History: Positive for: Arrhythmia - junctional rhythm; Congestive Heart Failure; Coronary Artery Disease; Hypertension; Myocardial Infarction; Peripheral Venous Disease Past Medical History Notes: cardiomyopathy Endocrine Medical History: Positive for: Type II Diabetes Negative for: Type I Diabetes Time with diabetes: 2004 Treated with: Insulin Blood sugar tested every day: Yes Tested : daily Genitourinary Medical History: Positive for: End Stage Renal Disease -  Dialysis Integumentary (Skin) Medical History: Negative for: History of Burn Musculoskeletal Medical History: Positive for: Osteoarthritis; Osteomyelitis Negative for: Gout; Rheumatoid Arthritis Neurologic Medical History: Positive for: Neuropathy Past Medical History Notes: TIA Oncologic Medical History: Negative for: Received Chemotherapy; Received Radiation Psychiatric Medical History: Positive for: Confinement Anxiety Negative for: MEARL, OLVER ([id number]) [phone].pdf Page 15 of 16 HBO Extended History Items Eyes: Cataracts Immunizations Pneumococcal Vaccine: Received Pneumococcal Vaccination:  Yes Received Pneumococcal Vaccination On or After 60th Birthday: No Implantable Devices Yes Hospitalization / Surgery History Type of Hospitalization/Surgery amputation left index finger 11/15/21 debridement and closure left ring finger peripheral vascular atherectomy 3/82/50 left bascillic vein transposition 12/1 21 revision of AV fistula left 05/24/20 AV fistula placement left 11/25/19 coronary stent intervention 12/11 19 right BKA 06/19/18 total left hip arthroplasty 10/11/16 left transmet amp 05/10/16 amputation above knee R 01/08/22 amputauoin left index finger 01/12/22 amputation right index finger Family and Social History Cancer: Yes - Mother; Diabetes: Yes - Siblings; Heart Disease: Yes - Mother,Father; Hereditary Spherocytosis: No; Hypertension: No; Kidney Disease: Yes - Siblings,Father; Lung Disease: No; Seizures: No; Stroke: No; Thyroid Problems: No; Tuberculosis: No; Former smoker - Quit 2 years ago; Marital Status - Widowed; Alcohol Use: Rarely; Drug Use: No History; Caffeine Use: Moderate - coffee; Financial Concerns: No; Food, Clothing or Shelter Needs: No; Support System Lacking: No; Transportation Concerns: No Electronic Signature(s) Signed: 07/19/2022 4:21:30 PM By: Fredirick Maudlin MD FACS Entered By: Fredirick Maudlin on 07/19/2022 15:29:26 -------------------------------------------------------------------------------- SuperBill Details Patient Name: Date of Service: Edward [name], [place] MES L. 07/19/2022 Medical Record Number: [id number] Patient Account Number: [id number] Date of Birth/Sex: Treating RN: May 05, 1971 (51 y.o. 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 N18.6 End stage renal disease Z89.611 Acquired absence of right leg above knee Z89.432 Acquired absence of left foot I50.42 Chronic combined systolic (congestive) and diastolic (congestive) heart failure I73.9 Peripheral vascular disease, unspecified E11.52 Type 2 diabetes mellitus with diabetic peripheral angiopathy with gangrene L97.522 Non-pressure chronic ulcer of other part of left foot with fat layer exposed L97.325 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 L97.822 Non-pressure chronic ulcer of other part of left lower leg with fat layer exposed MATTOX, SCHORR ([id number]) [phone].pdf Page 16 of 16 Facility Procedures : CPT4 Code Description: [id number] 11042 - DEB SUBQ TISSUE 20 SQ CM/< ICD-10 Diagnosis Description L97.825 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 w L97.325 Non-pressure chronic ulcer of left ankle with muscle  involvement without evidence of L97.822 Non-pressure chronic ulcer of other part of left lower leg with fat layer exposed Modifier: ithout evidence of necrosis Quantity: 1 necrosis : CPT4 Code Description: [id number] 11045 - DEB SUBQ TISS EA ADDL 20CM ICD-10 Diagnosis Description L97.825 Non-pressure chronic ulcer  of other part of left lower leg with muscle involvement w L97.325 Non-pressure chronic ulcer of left ankle with muscle  involvement without evidence of L97.822 Non-pressure chronic ulcer of other part of left lower leg with fat layer exposed Modifier: ithout evidence of necrosis Quantity: 28 necrosis : CPT4 Code Description: [id number] - DEBRIDE WOUND 1ST 20 SQ CM OR < ICD-10 Diagnosis Description L97.822 Non-pressure chronic ulcer of other part of left lower leg with fat layer exposed Modifier: Quantity: 1 Physician Procedures : CPT4 Code Description Modifier [id number] - WC PHYS LEVEL 3 - EST PT 25 ICD-10 Diagnosis Description L97.825 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 without evidence o L97.325 Non-pressure chronic ulcer of  left ankle with muscle involvement without evidence of necrosis L97.522 Non-pressure chronic ulcer of other part of left foot with fat layer exposed L97.822 Non-pressure chronic ulcer of  other part of left lower leg with fat layer exposed Quantity: 1 f necrosis : [id number] 11042 - WC PHYS SUBQ TISS 20 SQ CM ICD-10 Diagnosis Description L97.825 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 without evidence of L97.325 Non-pressure chronic ulcer of left ankle with muscle  involvement without evidence of necrosis L97.822 Non-pressure chronic ulcer of other part of left lower leg with fat layer exposed Quantity: 1 necrosis : [id number] 11045 - WC PHYS SUBQ TISS EA ADDL 20 CM ICD-10 Diagnosis Description L97.825 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

## 2022-07-20 DIAGNOSIS — L299 Pruritus, unspecified: Secondary | ICD-10-CM | POA: Diagnosis not present

## 2022-07-20 DIAGNOSIS — T8249XD Other complication of vascular dialysis catheter, subsequent encounter: Secondary | ICD-10-CM | POA: Diagnosis not present

## 2022-07-20 DIAGNOSIS — N2581 Secondary hyperparathyroidism of renal origin: Secondary | ICD-10-CM | POA: Diagnosis not present

## 2022-07-20 DIAGNOSIS — D631 Anemia in chronic kidney disease: Secondary | ICD-10-CM | POA: Diagnosis not present

## 2022-07-20 DIAGNOSIS — N186 End stage renal disease: Secondary | ICD-10-CM | POA: Diagnosis not present

## 2022-07-20 DIAGNOSIS — R52 Pain, unspecified: Secondary | ICD-10-CM | POA: Diagnosis not present

## 2022-07-20 DIAGNOSIS — R197 Diarrhea, unspecified: Secondary | ICD-10-CM | POA: Diagnosis not present

## 2022-07-20 DIAGNOSIS — D689 Coagulation defect, unspecified: Secondary | ICD-10-CM | POA: Diagnosis not present

## 2022-07-20 DIAGNOSIS — Z992 Dependence on renal dialysis: Secondary | ICD-10-CM | POA: Diagnosis not present

## 2022-07-22 DIAGNOSIS — S68119D Complete traumatic metacarpophalangeal amputation of unspecified finger, subsequent encounter: Secondary | ICD-10-CM | POA: Diagnosis not present

## 2022-07-22 DIAGNOSIS — L02519 Cutaneous abscess of unspecified hand: Secondary | ICD-10-CM | POA: Diagnosis not present

## 2022-07-22 DIAGNOSIS — T148XXA Other injury of unspecified body region, initial encounter: Secondary | ICD-10-CM | POA: Diagnosis not present

## 2022-07-22 DIAGNOSIS — I96 Gangrene, not elsewhere classified: Secondary | ICD-10-CM | POA: Diagnosis not present

## 2022-07-22 DIAGNOSIS — R52 Pain, unspecified: Secondary | ICD-10-CM | POA: Diagnosis not present

## 2022-07-22 DIAGNOSIS — I998 Other disorder of circulatory system: Secondary | ICD-10-CM | POA: Diagnosis not present

## 2022-07-23 DIAGNOSIS — Z992 Dependence on renal dialysis: Secondary | ICD-10-CM | POA: Diagnosis not present

## 2022-07-23 DIAGNOSIS — D631 Anemia in chronic kidney disease: Secondary | ICD-10-CM | POA: Diagnosis not present

## 2022-07-23 DIAGNOSIS — L299 Pruritus, unspecified: Secondary | ICD-10-CM | POA: Diagnosis not present

## 2022-07-23 DIAGNOSIS — D689 Coagulation defect, unspecified: Secondary | ICD-10-CM | POA: Diagnosis not present

## 2022-07-23 DIAGNOSIS — N186 End stage renal disease: Secondary | ICD-10-CM | POA: Diagnosis not present

## 2022-07-23 DIAGNOSIS — R197 Diarrhea, unspecified: Secondary | ICD-10-CM | POA: Diagnosis not present

## 2022-07-23 DIAGNOSIS — R52 Pain, unspecified: Secondary | ICD-10-CM | POA: Diagnosis not present

## 2022-07-23 DIAGNOSIS — T8249XD Other complication of vascular dialysis catheter, subsequent encounter: Secondary | ICD-10-CM | POA: Diagnosis not present

## 2022-07-23 DIAGNOSIS — N2581 Secondary hyperparathyroidism of renal origin: Secondary | ICD-10-CM | POA: Diagnosis not present

## 2022-07-25 ENCOUNTER — Encounter: Payer: Self-pay | Admitting: Cardiology

## 2022-07-25 DIAGNOSIS — R197 Diarrhea, unspecified: Secondary | ICD-10-CM | POA: Diagnosis not present

## 2022-07-25 DIAGNOSIS — Z992 Dependence on renal dialysis: Secondary | ICD-10-CM | POA: Diagnosis not present

## 2022-07-25 DIAGNOSIS — L299 Pruritus, unspecified: Secondary | ICD-10-CM | POA: Diagnosis not present

## 2022-07-25 DIAGNOSIS — D631 Anemia in chronic kidney disease: Secondary | ICD-10-CM | POA: Diagnosis not present

## 2022-07-25 DIAGNOSIS — R52 Pain, unspecified: Secondary | ICD-10-CM | POA: Diagnosis not present

## 2022-07-25 DIAGNOSIS — T8249XD Other complication of vascular dialysis catheter, subsequent encounter: Secondary | ICD-10-CM | POA: Diagnosis not present

## 2022-07-25 DIAGNOSIS — N2581 Secondary hyperparathyroidism of renal origin: Secondary | ICD-10-CM | POA: Diagnosis not present

## 2022-07-25 DIAGNOSIS — D689 Coagulation defect, unspecified: Secondary | ICD-10-CM | POA: Diagnosis not present

## 2022-07-25 DIAGNOSIS — N186 End stage renal disease: Secondary | ICD-10-CM | POA: Diagnosis not present

## 2022-07-26 DIAGNOSIS — S68119D Complete traumatic metacarpophalangeal amputation of unspecified finger, subsequent encounter: Secondary | ICD-10-CM | POA: Diagnosis not present

## 2022-07-26 DIAGNOSIS — M25641 Stiffness of right hand, not elsewhere classified: Secondary | ICD-10-CM | POA: Diagnosis not present

## 2022-07-26 DIAGNOSIS — M79644 Pain in right finger(s): Secondary | ICD-10-CM | POA: Diagnosis not present

## 2022-07-26 DIAGNOSIS — T148XXA Other injury of unspecified body region, initial encounter: Secondary | ICD-10-CM | POA: Diagnosis not present

## 2022-07-26 DIAGNOSIS — I96 Gangrene, not elsewhere classified: Secondary | ICD-10-CM | POA: Diagnosis not present

## 2022-07-26 DIAGNOSIS — L02519 Cutaneous abscess of unspecified hand: Secondary | ICD-10-CM | POA: Diagnosis not present

## 2022-07-26 DIAGNOSIS — I998 Other disorder of circulatory system: Secondary | ICD-10-CM | POA: Diagnosis not present

## 2022-07-26 DIAGNOSIS — M79645 Pain in left finger(s): Secondary | ICD-10-CM | POA: Diagnosis not present

## 2022-07-27 DIAGNOSIS — N2581 Secondary hyperparathyroidism of renal origin: Secondary | ICD-10-CM | POA: Diagnosis not present

## 2022-07-27 DIAGNOSIS — R197 Diarrhea, unspecified: Secondary | ICD-10-CM | POA: Diagnosis not present

## 2022-07-27 DIAGNOSIS — Z992 Dependence on renal dialysis: Secondary | ICD-10-CM | POA: Diagnosis not present

## 2022-07-27 DIAGNOSIS — D631 Anemia in chronic kidney disease: Secondary | ICD-10-CM | POA: Diagnosis not present

## 2022-07-27 DIAGNOSIS — R52 Pain, unspecified: Secondary | ICD-10-CM | POA: Diagnosis not present

## 2022-07-27 DIAGNOSIS — N186 End stage renal disease: Secondary | ICD-10-CM | POA: Diagnosis not present

## 2022-07-27 DIAGNOSIS — L299 Pruritus, unspecified: Secondary | ICD-10-CM | POA: Diagnosis not present

## 2022-07-27 DIAGNOSIS — T8249XD Other complication of vascular dialysis catheter, subsequent encounter: Secondary | ICD-10-CM | POA: Diagnosis not present

## 2022-07-27 DIAGNOSIS — D689 Coagulation defect, unspecified: Secondary | ICD-10-CM | POA: Diagnosis not present

## 2022-07-29 DIAGNOSIS — I998 Other disorder of circulatory system: Secondary | ICD-10-CM | POA: Diagnosis not present

## 2022-07-29 DIAGNOSIS — R52 Pain, unspecified: Secondary | ICD-10-CM | POA: Diagnosis not present

## 2022-07-29 DIAGNOSIS — S68119D Complete traumatic metacarpophalangeal amputation of unspecified finger, subsequent encounter: Secondary | ICD-10-CM | POA: Diagnosis not present

## 2022-07-29 DIAGNOSIS — T148XXA Other injury of unspecified body region, initial encounter: Secondary | ICD-10-CM | POA: Diagnosis not present

## 2022-07-29 DIAGNOSIS — I96 Gangrene, not elsewhere classified: Secondary | ICD-10-CM | POA: Diagnosis not present

## 2022-07-30 ENCOUNTER — Other Ambulatory Visit: Payer: Self-pay | Admitting: *Deleted

## 2022-07-30 DIAGNOSIS — T8249XD Other complication of vascular dialysis catheter, subsequent encounter: Secondary | ICD-10-CM | POA: Diagnosis not present

## 2022-07-30 DIAGNOSIS — Z992 Dependence on renal dialysis: Secondary | ICD-10-CM | POA: Diagnosis not present

## 2022-07-30 DIAGNOSIS — R197 Diarrhea, unspecified: Secondary | ICD-10-CM | POA: Diagnosis not present

## 2022-07-30 DIAGNOSIS — D689 Coagulation defect, unspecified: Secondary | ICD-10-CM | POA: Diagnosis not present

## 2022-07-30 DIAGNOSIS — D631 Anemia in chronic kidney disease: Secondary | ICD-10-CM | POA: Diagnosis not present

## 2022-07-30 DIAGNOSIS — L299 Pruritus, unspecified: Secondary | ICD-10-CM | POA: Diagnosis not present

## 2022-07-30 DIAGNOSIS — I739 Peripheral vascular disease, unspecified: Secondary | ICD-10-CM

## 2022-07-30 DIAGNOSIS — I96 Gangrene, not elsewhere classified: Secondary | ICD-10-CM

## 2022-07-30 DIAGNOSIS — L97522 Non-pressure chronic ulcer of other part of left foot with fat layer exposed: Secondary | ICD-10-CM | POA: Diagnosis not present

## 2022-07-30 DIAGNOSIS — R52 Pain, unspecified: Secondary | ICD-10-CM | POA: Diagnosis not present

## 2022-07-30 DIAGNOSIS — N186 End stage renal disease: Secondary | ICD-10-CM | POA: Diagnosis not present

## 2022-07-30 DIAGNOSIS — N2581 Secondary hyperparathyroidism of renal origin: Secondary | ICD-10-CM | POA: Diagnosis not present

## 2022-07-31 ENCOUNTER — Encounter (HOSPITAL_BASED_OUTPATIENT_CLINIC_OR_DEPARTMENT_OTHER): Payer: Medicare Other | Admitting: Internal Medicine

## 2022-07-31 DIAGNOSIS — Z992 Dependence on renal dialysis: Secondary | ICD-10-CM | POA: Diagnosis not present

## 2022-07-31 DIAGNOSIS — L97422 Non-pressure chronic ulcer of left heel and midfoot with fat layer exposed: Secondary | ICD-10-CM | POA: Diagnosis not present

## 2022-07-31 DIAGNOSIS — Z89611 Acquired absence of right leg above knee: Secondary | ICD-10-CM | POA: Diagnosis not present

## 2022-07-31 DIAGNOSIS — I132 Hypertensive heart and chronic kidney disease with heart failure and with stage 5 chronic kidney disease, or end stage renal disease: Secondary | ICD-10-CM | POA: Diagnosis not present

## 2022-07-31 DIAGNOSIS — N186 End stage renal disease: Secondary | ICD-10-CM | POA: Diagnosis not present

## 2022-07-31 DIAGNOSIS — E1140 Type 2 diabetes mellitus with diabetic neuropathy, unspecified: Secondary | ICD-10-CM | POA: Diagnosis not present

## 2022-07-31 DIAGNOSIS — Z89511 Acquired absence of right leg below knee: Secondary | ICD-10-CM | POA: Diagnosis not present

## 2022-07-31 DIAGNOSIS — E1152 Type 2 diabetes mellitus with diabetic peripheral angiopathy with gangrene: Secondary | ICD-10-CM | POA: Diagnosis not present

## 2022-07-31 DIAGNOSIS — L89620 Pressure ulcer of left heel, unstageable: Secondary | ICD-10-CM | POA: Diagnosis not present

## 2022-07-31 DIAGNOSIS — L97825 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 without evidence of necrosis: Secondary | ICD-10-CM | POA: Diagnosis not present

## 2022-07-31 DIAGNOSIS — L97325 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: Secondary | ICD-10-CM | POA: Diagnosis not present

## 2022-07-31 DIAGNOSIS — I2510 Atherosclerotic heart disease of native coronary artery without angina pectoris: Secondary | ICD-10-CM | POA: Diagnosis not present

## 2022-07-31 DIAGNOSIS — L97522 Non-pressure chronic ulcer of other part of left foot with fat layer exposed: Secondary | ICD-10-CM | POA: Diagnosis not present

## 2022-07-31 DIAGNOSIS — E11621 Type 2 diabetes mellitus with foot ulcer: Secondary | ICD-10-CM | POA: Diagnosis not present

## 2022-07-31 DIAGNOSIS — E1122 Type 2 diabetes mellitus with diabetic chronic kidney disease: Secondary | ICD-10-CM | POA: Diagnosis not present

## 2022-07-31 DIAGNOSIS — I5042 Chronic combined systolic (congestive) and diastolic (congestive) heart failure: Secondary | ICD-10-CM | POA: Diagnosis not present

## 2022-07-31 DIAGNOSIS — Z833 Family history of diabetes mellitus: Secondary | ICD-10-CM | POA: Diagnosis not present

## 2022-08-01 ENCOUNTER — Other Ambulatory Visit: Payer: Self-pay | Admitting: Cardiology

## 2022-08-01 ENCOUNTER — Other Ambulatory Visit: Payer: Self-pay | Admitting: Nurse Practitioner

## 2022-08-01 DIAGNOSIS — R52 Pain, unspecified: Secondary | ICD-10-CM | POA: Diagnosis not present

## 2022-08-01 DIAGNOSIS — N2581 Secondary hyperparathyroidism of renal origin: Secondary | ICD-10-CM | POA: Diagnosis not present

## 2022-08-01 DIAGNOSIS — Z992 Dependence on renal dialysis: Secondary | ICD-10-CM | POA: Diagnosis not present

## 2022-08-01 DIAGNOSIS — L299 Pruritus, unspecified: Secondary | ICD-10-CM | POA: Diagnosis not present

## 2022-08-01 DIAGNOSIS — D689 Coagulation defect, unspecified: Secondary | ICD-10-CM | POA: Diagnosis not present

## 2022-08-01 DIAGNOSIS — T8249XD Other complication of vascular dialysis catheter, subsequent encounter: Secondary | ICD-10-CM | POA: Diagnosis not present

## 2022-08-01 DIAGNOSIS — D631 Anemia in chronic kidney disease: Secondary | ICD-10-CM | POA: Diagnosis not present

## 2022-08-01 DIAGNOSIS — E7800 Pure hypercholesterolemia, unspecified: Secondary | ICD-10-CM

## 2022-08-01 DIAGNOSIS — R197 Diarrhea, unspecified: Secondary | ICD-10-CM | POA: Diagnosis not present

## 2022-08-01 DIAGNOSIS — N186 End stage renal disease: Secondary | ICD-10-CM | POA: Diagnosis not present

## 2022-08-01 NOTE — Progress Notes (Signed)
BRANDUN, PINN ([id number]) [id number] Page 1 of 10 Visit Report for 07/31/2022 Debridement Details Patient Name: Date of Service: Edward [name] MES L. 07/31/2022 11:00 A M Medical Record Number: [id number] Patient Account Number: [id number] Date of Birth/Sex: Treating RN: [birth year] (51 y.o. M) Primary Care Provider: [name] Other Clinician: Referring Provider: Treating Provider/Extender: Sherre Poot in Treatment: 11 Debridement Performed for Assessment: Wound #18 Left Calcaneus Performed By: Physician Ricard Dillon., MD Debridement Type: Debridement Level of Consciousness (Pre-procedure): Awake and Alert Pre-procedure Verification/Time Out Yes - 11:51 Taken: Start Time: 11:51 Pain Control: Lidocaine 4% Topical Solution T Area Debrided (L x W): otal 5 (cm) x 9 (cm) = 45 (cm) Tissue and other material debrided: Non-Viable, Other: skin Level: Non-Viable Tissue Debridement Description: Selective/Open Wound Instrument: Forceps, Scissors Bleeding: Minimum Hemostasis Achieved: Pressure End Time: 11:52 Procedural Pain: 0 Post Procedural Pain: 0 Response to Treatment: Procedure was tolerated well Level of Consciousness (Post- Awake and Alert procedure): Post Debridement Measurements of Total Wound Length: (cm) 5 Stage: Unstageable/Unclassified Width: (cm) 9 Depth: (cm) 0.1 Volume: (cm) 3.534 Character of Wound/Ulcer Post Debridement: Improved Post Procedure Diagnosis Same as Pre-procedure Notes Scribed for Dr. Dellia Nims by J.Scotton Electronic Signature(s) Signed: 07/31/2022 5:47:10 PM By: Linton Ham MD Entered By: Linton Ham on 07/31/2022 12:16:55 -------------------------------------------------------------------------------- HPI Details Patient Name: Date of Service: Edward [name] MES L. 07/31/2022 11:00 A M Medical Record Number: [id number] Patient Account Number: [id number] Date of Birth/Sex:  Treating RN: 1970-09-01 (51 y.o. M) Primary Care Provider: [name] Other Clinician: Referring Provider: Treating Provider/Extender: Sherre Poot in Treatment: [address], [place] L ([id number]) [id number] Page 2 of 10 History of Present Illness HPI Description: Admission 09/24/2021 [name] is a 51 year old male with a past medical history of foreign years gangrene, type 2 diabetes, end-stage renal disease on hemodialysis, right BKA, left transmetatarsal amputation that presents to the clinic for 3 wounds. He developed cellulitis to the left fourth finger and was treated with doxycycline at urgent care. The nail is coming off. He reports taking the antibiotics currently. He has been keeping the area covered. He also presents with 2 left lower extremity wounds. He reports that the more proximal wound has been present for several months and the more distal wound has developed more recently over the past several weeks. He had an abdominal aortogram to the left lower extremity with laser atherectomy to the left popliteal, left tibioperoneal trunk and left peroneal arteries, drug-coated balloon angioplasty of the left popliteal artery and plain balloon angioplasty of the left tibioperoneal trunk and left peroneal artery. He has not followed up with vein and vascular since his procedure on 04/09/2021. He currently denies signs of infection. He currently denies pain. 11/28/2021: The patient has not been seen since his initial admission. He has not followed up with vascular surgery, either. He did undergo amputation of his left fourth finger and is currently under the care of orthopedics and occupational therapy for this. He presents today with multiple wounds on his bilateral lower extremities. He is very fluid overloaded, based upon the appearance of his extremities. These all seem to have started as blisters that have subsequently opened and  caused ulcers. He does have a shrinker sock for his right BKA, but he is unable to wear it secondary to pain. He also has a compression stocking for his left lower extremity but he has been unable to apply it due to the amputation  on his left hand. The largest and most painful of these wounds is just above the knee on the right. All of the wounds have eschar and some slough present. No overt signs of infection. 12/05/2021: All of the wounds look about the same. He has new blisters that have opened up revealing denuded skin underneath. The eschar is extremely hard and where there is not eschar, the wounds are gritty. He was unable to tolerate the Ace wrap at the tightness that we applied it last week and ended up having to have it loosened. He has been tolerating Kerlix and Coban on the left leg. He has not received a follow-up appointment with vascular surgery yet. We have been using Santyl under Hydrofera Blue. 12/12/2021: We have been unable to secure home health care assistance. The patient and his son are doing his wound care at home. He does have a follow-up visit in vascular surgery on May 24. He appears to be less fluid overloaded today. The wound on his right medial thigh looks bigger today and has a thick layer of hard dark eschar. The wound on his lateral right stump looks somewhat improved with softer eschar and slough. There is also slough on the wound overlying the end of the stump and wrapping around to the backside of it. On his left leg, the edema control is improved. The wound on his anterior tibial surface is small and clean. On his dorsal and medial ankle, there is slough buildup. 12/19/2021: The wound on his left anterior tibial surface is closed. The wound on his dorsal left foot looks about the same with just a bit of slough buildup. The surface is gritty. The wound on his right medial thigh looks about the same today and has reaccumulated the hard dark eschar, despite vigorous  debridement last week. The wound on the right lateral stump is about the same and has less eschar and slough. Overlying the end of the stump and wrapping around the backside of it, the wounds have converged. Overall, his edema control is much better than it has been and certainly than it was on the first visit. 12/26/2021: The wound on his dorsal left foot has a bit of slough accumulation but the surface is less gritty. The wound on his left medial calf is clean with a good bit of granulation tissue. The wounds on his right thigh and stump continue to display a heavy thick layer of black eschar. 01/01/2022: The wound on his dorsal left foot is fairly clean but still has a gritty surface. The wound on his left medial calf is nearly closed with just a little bit of overlying eschar. Unfortunately, the wounds on his right thigh and stump have deteriorated. 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 He has a fluctuant area near the proximal part of his medial thigh wound. There is an odor coming from that leg but I am not sure which of the wounds is producing it or if both of them are. 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 He is now back for further evaluation and management of 3 small wounds on his left lower leg, just above the ankle on the anterior tibial surface. They are very fibrotic and dry. Muscle fibers are visible in the most proximal wound. There is slough and  leathery eschar accumulation present. 06/20/2022: Mr. Puskas returns to clinic after having been in the hospital for quite some time. He has returned with multiple new wounds. The largest of these is a nearly circumferential wound on the mid to lower portion of his left lower leg. He has wounds on his Achilles tendon area with muscle and  tendon exposed. The anterior ankle wound has thick black eschar accumulated on the surface. The dorsal foot wound also has slough and eschar buildup. All of the wound surfaces are incredibly fibrotic and do not look particularly viable. 06/27/2022: All of the wounds are bigger this week, but most of them have an improved surface, with less fibrosis and even some pink coloration, particularly the circumferential leg wound. The anterior ankle wound has some tendon exposure underneath fibrinous slough in the proximal anterior leg wound remains fairly dry and tough. 07/05/2022: Cultures that I took were positive for Pseudomonas aeruginosa. I prescribed levofloxacin, but the patient did not fill or take it because he has been taking ciprofloxacin, prescribed by his orthopedic surgeon. All of the wound surfaces actually look better this week. There is a film that is stained yellow from the Iodoflex/Iodosorb. Still with extensive slough on all wound surfaces. He still has leathery eschar on his dorsal foot wound and the Achilles tendon is exposed and is quite desiccated. 07/19/2022: There is more pink tissue visible on his circumferential leg wound. There is thick slough accumulation. The dorsal foot wound and the portion of the circumferential wound that involves the anterior ankle have tendon exposure and some necrotic fibrotic material. Despite this, most of the surfaces appear more viable. 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 Followed by Dr. Donzetta Matters of vascular surgery as well. He has a follow- up appointment next week. His last angiogram was in May 2023 at that point on the left side he had a patent common femoral and SFA there was 50% stenosis of the popliteal artery and he appeared to have dominant flow via the peroneal to the transmetatarsal amputation on the left. He did not have any interventions on the left side. He comes in today with a large  area of his left heel with an ischemic eschar likely dry gangrene. This had not been previously defined. The person who brought him said that this was not there 3 days ago. He is relatively insensate so really does not complain of pain he does not look to be systemically unwell Electronic Signature(s) Signed: 07/31/2022 5:47:10 PM By: Linton Ham MD Entered By: Linton Ham on 07/31/2022 12:19:55 [name] ([id number]) [id number] Page 3 of 10 -------------------------------------------------------------------------------- Physical Exam Details Patient Name: Date of Service: Edward [name] MES L. 07/31/2022 11:00 A M Medical Record Number: [id number] Patient Account Number: [id number] Date of Birth/Sex: Treating RN: [birth year] (51 y.o. M) Primary Care Provider: [name] Other Clinician: Referring Provider: Treating Provider/Extender: Sherre Poot in Treatment: 11 Constitutional Sitting or standing Blood Pressure is within target range for patient.. Pulse regular and within target range for patient.Marland Kitchen Respirations regular, non-labored and within target range.. Temperature is normal and within the target range for the patient.Marland Kitchen Appears in no distress. Cardiovascular pedal pulses including the DP and PT are thready but palpable.. Notes wound exam; Substantial amount of the wound damage in the left leg. There is a large amount of superficial damage on the left anterior lateral leg however in the lower part anteriorly a  large wound with a necrotic base and exposed tendon. Most of this is covered in necrotic fibrinous debris. I do not think anything here looks particularly healthy. More problematically today on the left heel is an ischemic black eschar. This does not look to be an infected. Above this there was loss of skin which I removed with pickups and scissors. I did no debridement of the eschar however I do not think anything  is indicated until he has an attempt at revascularization if that is what is going to happen here. Electronic Signature(s) Signed: 07/31/2022 5:47:10 PM By: Linton Ham MD Entered By: Linton Ham on 07/31/2022 12:31:16 -------------------------------------------------------------------------------- Physician Orders Details Patient Name: Date of Service: Edward Leeds, [name] 07/31/2022 11:00 A M Medical Record Number: [id number] Patient Account Number: [id number] Date of Birth/Sex: Treating RN: 01/15/1971 (51 y.o. [name] Primary Care Provider: [name] Other Clinician: Referring Provider: Treating Provider/Extender: Sherre Poot in Treatment: 11 Verbal / Phone Orders: No Diagnosis Coding Follow-up Appointments ppointment in 2 weeks. - Dr. Celine Ahr Room 3 Return A Anesthetic (In clinic) Topical Lidocaine 4% applied to wound bed Bathing/ Shower/ Hygiene May shower and wash wound with soap and water. - use antibacterial soap to wash legs and wounds when changing dressing. right leg Edema Control - Lymphedema / SCD / Other Elevate legs to the level of the heart or above for 30 minutes daily and/or when sitting, a frequency of: - throughout the day Avoid standing for long periods of time. Off-Loading Prevalon Boot - Left foot Prevalon Boot Additional Orders / Instructions Follow Nutritious Diet - Monitor/Control Blood Sugars Wound Treatment Wound #13 - Lower Leg Wound Laterality: Left, Circumferential Cleanser: Soap and Water Every Other Day/30 Days Discharge Instructions: May shower and wash wound with dial antibacterial soap and water prior to dressing change. Peri-Wound Care: Sween Lotion (Moisturizing lotion) Every Other Day/30 Days Discharge Instructions: Apply moisturizing lotion as directed MANAS, HICKLING ([id number]) [id number] Page 4 of 10 Prim Dressing: Iodosorb Gel 10 (gm) Tube (DME) (Generic) Every  Other Day/30 Days ary Discharge Instructions: Apply to wound bed as instructed Secondary Dressing: ABD Pad, 5x9 (DME) (Generic) Every Other Day/30 Days Discharge Instructions: Apply over primary dressing as directed. Secondary Dressing: Woven Gauze Sponge, Non-Sterile 4x4 in (DME) (Generic) Every Other Day/30 Days Discharge Instructions: Apply over primary dressing as directed. Secured With: [employer], 4.5x3.1 (in/yd) (DME) (Generic) Every Other Day/30 Days Discharge Instructions: Secure with Kerlix as directed. Secured With: 148M Medipore H Soft Cloth Surgical T ape, 4 x 10 (in/yd) (DME) (Generic) Every Other Day/30 Days Discharge Instructions: Secure with tape as directed. Wound #14 - Foot Wound Laterality: Dorsal, Left Cleanser: Soap and Water Every Other Day/30 Days Discharge Instructions: May shower and wash wound with dial antibacterial soap and water prior to dressing change. Peri-Wound Care: Sween Lotion (Moisturizing lotion) Every Other Day/30 Days Discharge Instructions: Apply moisturizing lotion as directed Prim Dressing: Iodosorb Gel 10 (gm) Tube (DME) (Generic) Every Other Day/30 Days ary Discharge Instructions: Apply to wound bed as instructed Secondary Dressing: ABD Pad, 5x9 (DME) (Generic) Every Other Day/30 Days Discharge Instructions: Apply over primary dressing as directed. Secondary Dressing: Woven Gauze Sponge, Non-Sterile 4x4 in (DME) (Generic) Every Other Day/30 Days Discharge Instructions: Apply over primary dressing as directed. Secured With: [employer], 4.5x3.1 (in/yd) (DME) (Generic) Every Other Day/30 Days Discharge Instructions: Secure with Kerlix as directed. Secured With: 148M Medipore H Soft Cloth Surgical T ape, 4 x  10 (in/yd) (DME) (Generic) Every Other Day/30 Days Discharge Instructions: Secure with tape as directed. Wound #17 - Amputation Site - Transmetatarsal Wound Laterality: Left Cleanser: Soap and Water Every Other Day/30  Days Discharge Instructions: May shower and wash wound with dial antibacterial soap and water prior to dressing change. Peri-Wound Care: Sween Lotion (Moisturizing lotion) Every Other Day/30 Days Discharge Instructions: Apply moisturizing lotion as directed Prim Dressing: Iodosorb Gel 10 (gm) Tube (DME) (Generic) Every Other Day/30 Days ary Discharge Instructions: Apply to wound bed as instructed Secondary Dressing: ABD Pad, 5x9 (DME) (Generic) Every Other Day/30 Days Discharge Instructions: Apply over primary dressing as directed. Secondary Dressing: Woven Gauze Sponge, Non-Sterile 4x4 in (DME) (Generic) Every Other Day/30 Days Discharge Instructions: Apply over primary dressing as directed. Secured With: [employer], 4.5x3.1 (in/yd) (DME) (Generic) Every Other Day/30 Days Discharge Instructions: Secure with Kerlix as directed. Secured With: 20M Medipore H Soft Cloth Surgical T ape, 4 x 10 (in/yd) (DME) (Generic) Every Other Day/30 Days Discharge Instructions: Secure with tape as directed. Wound #18 - Calcaneus Wound Laterality: Left Cleanser: Soap and Water Every Other Day/30 Days Discharge Instructions: May shower and wash wound with dial antibacterial soap and water prior to dressing change. Peri-Wound Care: Sween Lotion (Moisturizing lotion) Every Other Day/30 Days Discharge Instructions: Apply moisturizing lotion as directed Prim Dressing: [employer] Dressing 6x6 (in/in) (DME) (Generic) Every Other Day/30 Days ary Discharge Instructions: Apply to wound bed as instructed Secondary Dressing: ABD Pad, 5x9 (DME) (Generic) Every Other Day/30 Days Discharge Instructions: Apply over primary dressing as directed. Secondary Dressing: Woven Gauze Sponge, Non-Sterile 4x4 in (DME) (Generic) Every Other Day/30 Days Discharge Instructions: Apply over primary dressing as directed. Secured With: [employer], 4.5x3.1 (in/yd) (DME) (Generic) Every Other Day/30 Days TAFARI, HUMISTON  ([id number]) [id number] Page 5 of 10 Discharge Instructions: Secure with Kerlix as directed. Secured With: 20M Medipore H Soft Cloth Surgical T ape, 4 x 10 (in/yd) (DME) (Generic) Every Other Day/30 Days Discharge Instructions: Secure with tape as directed. Patient Medications llergies: Crestor, Dilaudid, Benadryl A Notifications Medication Indication Start End 07/31/2022 lidocaine DOSE topical 4 % cream - cream topical Electronic Signature(s) Signed: 07/31/2022 2:56:09 PM By: Dellie Catholic RN Signed: 07/31/2022 5:47:10 PM By: Linton Ham MD Entered By: Dellie Catholic on 07/31/2022 12:14:53 -------------------------------------------------------------------------------- Problem List Details Patient Name: Date of Service: Edward Leeds, [name] 07/31/2022 11:00 A M Medical Record Number: [id number] Patient Account Number: [id number] Date of Birth/Sex: Treating RN: Oct 05, 1970 (51 y.o. 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 05/15/2022 No Yes involvement without evidence of necrosis N18.6 End stage renal disease 05/15/2022 No Yes Z89.611 Acquired absence of right leg above knee 05/15/2022 No Yes Z89.432 Acquired absence of left foot 05/15/2022 No Yes I50.42 Chronic combined systolic (congestive) and diastolic (congestive) heart failure 05/15/2022 No Yes I73.9 Peripheral vascular disease, unspecified 05/15/2022 No Yes E11.52 Type 2 diabetes mellitus with diabetic peripheral angiopathy with gangrene 05/15/2022 No Yes L97.522 Non-pressure chronic ulcer of other part of left foot with fat layer exposed 06/20/2022 No Yes WHITFIELD, DULAY ([id number]) [id number] Page 6  of 10 L97.325 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 06/20/2022 No Yes evidence of necrosis L97.822 Non-pressure chronic ulcer of other part of left lower leg with  fat layer 06/20/2022 No Yes exposed L97.422 Non-pressure chronic ulcer of left heel and midfoot with fat layer exposed 07/31/2022 No Yes Inactive Problems Resolved Problems Electronic Signature(s) Signed: 07/31/2022 5:47:10 PM By: Linton Ham MD Entered By: Linton Ham on 07/31/2022 12:16:32 -------------------------------------------------------------------------------- Progress Note Details Patient Name: Date of Service: Edward Leeds, [name] 07/31/2022 11:00 A M Medical Record Number: [id number] Patient Account Number: [id number] Date of Birth/Sex: Treating RN: [birth year] (51 y.o. M) Primary Care Provider: [name] Other Clinician: Referring Provider: Treating Provider/Extender: Sherre Poot in Treatment: 11 Subjective History of Present Illness (HPI) Admission 09/24/2021 Mr. Sender Rueb is a 51 year old male with a past medical history of foreign years gangrene, type 2 diabetes, end-stage renal disease on hemodialysis, right BKA, left transmetatarsal amputation that presents to the clinic for 3 wounds. He developed cellulitis to the left fourth finger and was treated with doxycycline at urgent care. The nail is coming off. He reports taking the antibiotics currently. He has been keeping the area covered. He also presents with 2 left lower extremity wounds. He reports that the more proximal wound has been present for several months and the more distal wound has developed more recently over the past several weeks. He had an abdominal aortogram to the left lower extremity with laser atherectomy to the left popliteal, left tibioperoneal trunk and left peroneal arteries, drug-coated balloon angioplasty of the left popliteal artery and plain balloon angioplasty of  the left tibioperoneal trunk and left peroneal artery. He has not followed up with vein and vascular since his procedure on 04/09/2021. He currently denies signs of infection. He currently denies pain. 11/28/2021: The patient has not been seen since his initial admission. He has not followed up with vascular surgery, either. He did undergo amputation of his left fourth finger and is currently under the care of orthopedics and occupational therapy for this. He presents today with multiple wounds on his bilateral lower extremities. He is very fluid overloaded, based upon the appearance of his extremities. These all seem to have started as blisters that have subsequently opened and caused ulcers. He does have a shrinker sock for his right BKA, but he is unable to wear it secondary to pain. He also has a compression stocking for his left lower extremity but he has been unable to apply it due to the amputation on his left hand. The largest and most painful of these wounds is just above the knee on the right. All of the wounds have eschar and some slough present. No overt signs of infection. 12/05/2021: All of the wounds look about the same. He has new blisters that have opened up revealing denuded skin underneath. The eschar is extremely hard and where there is not eschar, the wounds are gritty. He was unable to tolerate the Ace wrap at the tightness that we applied it last week and ended up having to have it loosened. He has been tolerating Kerlix and Coban on the left leg. He has not received a follow-up appointment with vascular surgery yet. We have been using Santyl under Hydrofera Blue. 12/12/2021: We have been unable to secure home health care assistance. The patient and his son are doing his wound care at home. He does have a follow-up visit in vascular surgery on May 24. He appears to be less fluid overloaded today. The wound on his right medial thigh looks bigger today and has a thick layer of hard  dark eschar. The wound on his  lateral right stump looks somewhat improved with softer eschar and slough. There is also slough on the wound overlying the end of the stump and wrapping around to the backside of it. On his left leg, the edema control is improved. The wound on his anterior tibial surface is small and clean. On his dorsal and medial ankle, there is slough buildup. 12/19/2021: The wound on his left anterior tibial surface is closed. The wound on his dorsal left foot looks about the same with just a bit of slough buildup. The surface is gritty. The wound on his right medial thigh looks about the same today and has reaccumulated the hard dark eschar, despite vigorous debridement last week. The wound on the right lateral stump is about the same and has less eschar and slough. Overlying the end of the stump and wrapping around the backside of it, the wounds have converged. Overall, his edema control is much better than it has been and certainly than it was on the first visit. 12/26/2021: The wound on his dorsal left foot has a bit of slough accumulation but the surface is less gritty. The wound on his left medial calf is clean with a good bit of granulation tissue. The wounds on his right thigh and stump continue to display a heavy thick layer of black eschar. 01/01/2022: The wound on his dorsal left foot is fairly clean but still has a gritty surface. The wound on his left medial calf is nearly closed with just a little bit of overlying eschar. Unfortunately, the wounds on his right thigh and stump have deteriorated. The distal portion of his BKA stump is white and cold and appears TRAYVEON, BECKFORD ([id number]) [id number] Page 7 of 10 to be completely nonviable. He has a fluctuant area near the proximal part of his medial thigh wound. There is an odor coming from that leg but I am not sure which of the wounds is producing it or if both of them  are. 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 He is now back for further evaluation and management of 3 small wounds on his left lower leg, just above the ankle on the anterior tibial surface. They are very fibrotic and dry. Muscle fibers are visible in the most proximal wound. There is slough and leathery eschar accumulation present. 06/20/2022: Mr. Serviss returns to clinic after having been in the hospital for quite some time. He has returned with multiple new wounds. The largest of these is a nearly circumferential wound on the mid to lower portion of his left lower leg. He has wounds on his Achilles tendon area with muscle and tendon exposed. The anterior ankle wound has thick black eschar accumulated on the surface. The dorsal foot wound also has slough and eschar buildup. All of the wound surfaces are incredibly fibrotic and do not look particularly viable. 06/27/2022: All of the wounds are bigger this week, but most of them have an improved surface, with less fibrosis and even some pink coloration, particularly the circumferential leg wound. The anterior ankle wound has some tendon exposure underneath fibrinous slough in the proximal anterior leg wound remains fairly dry and tough. 07/05/2022: Cultures that I took were positive for Pseudomonas aeruginosa. I prescribed levofloxacin, but the patient did not fill or take it because he has been taking ciprofloxacin, prescribed by his orthopedic surgeon. All of  the wound surfaces actually look better this week. There is a film that is stained yellow from the Iodoflex/Iodosorb. Still with extensive slough on all wound surfaces. He still has leathery eschar on his dorsal foot wound and the Achilles tendon is exposed and is quite desiccated. 07/19/2022: There is more pink tissue visible on his  circumferential leg wound. There is thick slough accumulation. The dorsal foot wound and the portion of the circumferential wound that involves the anterior ankle have tendon exposure and some necrotic fibrotic material. Despite this, most of the surfaces appear more viable. 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 Followed by Dr. Donzetta Matters of vascular surgery as well. He has a follow- up appointment next week. His last angiogram was in May 2023 at that point on the left side he had a patent common femoral and SFA there was 50% stenosis of the popliteal artery and he appeared to have dominant flow via the peroneal to the transmetatarsal amputation on the left. He did not have any interventions on the left side. He comes in today with a large area of his left heel with an ischemic eschar likely dry gangrene. This had not been previously defined. The person who brought him said that this was not there 3 days ago. He is relatively insensate so really does not complain of pain he does not look to be systemically unwell Objective Constitutional Sitting or standing Blood Pressure is within target range for patient.. Pulse regular and within target range for patient.Marland Kitchen Respirations regular, non-labored and within target range.. Temperature is normal and within the target range for the patient.Marland Kitchen Appears in no distress. Vitals Time Taken: 11:24 AM, Temperature: 97.9 F, Pulse: 88 bpm, Respiratory Rate: 18 breaths/min, Blood Pressure: 124/76 mmHg. Cardiovascular pedal pulses including the DP and PT are thready but palpable.. General Notes: wound exam; Substantial amount of the wound damage in the left leg. There is a large amount of superficial damage on the left anterior lateral leg however in the lower part anteriorly a large wound with a necrotic base and exposed tendon. Most of this is covered in necrotic fibrinous debris. I do not think anything here looks  particularly healthy. More problematically today on the left heel is an ischemic black eschar. This does not look to be an infected. Above this there was loss of skin which I removed with pickups and scissors. I did no debridement of the eschar however I do not think anything is indicated until he has an attempt at revascularization if that is what is going to happen here. Integumentary (Hair, Skin) Wound #13 status is Open. Original cause of wound was Gradually Appeared. The date acquired was: 06/18/2022. The wound has been in treatment 5 weeks. The wound is located on the Left,Circumferential Lower Leg. The wound measures 30.7cm length x 26cm width x 0.3cm depth; 626.905cm^2 area and 188.071cm^3 volume. There is tendon and Fat Layer (Subcutaneous Tissue) exposed. There is no tunneling or undermining noted. There is a medium amount of serosanguineous drainage noted. The wound margin is distinct with the outline attached to the wound base. There is small (1-33%) pink granulation within the wound bed. There is a large (67-100%) amount of necrotic tissue within the wound bed including Eschar and Adherent Slough. The periwound skin appearance had no abnormalities noted for texture. The periwound skin appearance exhibited: Dry/Scaly. The periwound skin appearance did not exhibit: Maceration, Atrophie Blanche, Cyanosis, Ecchymosis, Hemosiderin Staining, Mottled, Pallor, Rubor,  Erythema. Periwound temperature was noted as No Abnormality. Wound #14 status is Open. Original cause of wound was Gradually Appeared. The date acquired was: 06/18/2022. The wound has been in treatment 5 weeks. The wound is located on the Left,Dorsal Foot. The wound measures 1.6cm length x 2.2cm width x 0.2cm depth; 2.765cm^2 area and 0.553cm^3 volume. There is Fat Layer (Subcutaneous Tissue) exposed. There is no tunneling or undermining noted. There is a medium amount of serosanguineous drainage noted. The wound margin is distinct  with the outline attached to the wound base. There is small (1-33%) pink granulation within the wound bed. There is a large (67-100%) amount of necrotic tissue within the wound bed including Eschar and Adherent Slough. The periwound skin appearance exhibited: Scarring, Dry/Scaly, Hemosiderin Staining. The periwound skin appearance did not exhibit: Maceration. Periwound temperature was noted as No Abnormality. Wound #17 status is Open. Original cause of wound was Blister. The date acquired was: 07/12/2022. The wound has been in treatment 1 weeks. The wound is located on the Left Amputation Site - Transmetatarsal. The wound measures 1.5cm length x 2.2cm width x 0.1cm depth; 2.592cm^2 area and 0.259cm^3 volume. There is Fat Layer (Subcutaneous Tissue) exposed. There is no tunneling or undermining noted. There is a medium amount of serosanguineous drainage noted. The wound margin is distinct with the outline attached to the wound base. There is large (67-100%) red granulation within the wound bed. There is a small (1-33%) amount of necrotic tissue within the wound bed including Eschar and Adherent Slough. The periwound skin appearance had no abnormalities noted for texture. The periwound skin appearance had no abnormalities noted for color. The periwound skin appearance exhibited: Dry/Scaly. Periwound temperature was noted as No Abnormality. Wound #18 status is Open. Original cause of wound was Pressure Injury. The date acquired was: 07/31/2022. The wound is located on the Left Calcaneus. The wound measures 5cm length x 9cm width x 0.1cm depth; 35.343cm^2 area and 3.534cm^3 volume. There is Fat Layer (Subcutaneous Tissue) exposed. There is no tunneling or undermining noted. There is a medium amount of serosanguineous drainage noted. The wound margin is distinct with the outline attached to the wound base. There is small (1-33%) red granulation within the wound bed. There is a large (67-100%) amount of  necrotic tissue within the wound bed including Eschar and Adherent Slough. The periwound skin appearance had no abnormalities noted for texture. The periwound skin appearance had no abnormalities noted LOGON, UTTECH ([id number]) [id number] Page 8 of 10 for color. The periwound skin appearance exhibited: Dry/Scaly. Periwound temperature was noted as No Abnormality. 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 There was a Selective/Open Wound Non-Viable Tissue Debridement with a total area of 45 sq cm performed by Ricard Dillon., MD. With the following instrument(s): Forceps, and  Scissors to remove Non-Viable tissue/material. Material removed includes Other: skin after achieving pain control using Lidocaine 4% Topical Solution. No specimens were taken. A time out was conducted at 11:51, prior to the start of the procedure. A Minimum amount of bleeding was controlled with Pressure. The procedure was tolerated well with a pain level of 0 throughout and a pain level of 0 following the procedure. Post Debridement Measurements: 5cm length x 9cm width x  0.1cm depth; 3.534cm^3 volume. Post debridement Stage noted as Unstageable/Unclassified. Character of Wound/Ulcer Post Debridement is improved. Post procedure Diagnosis Wound #18: Same as Pre-Procedure General Notes: Scribed for Dr. Dellia Nims by J.Scotton. Plan Follow-up Appointments: Return Appointment in 2 weeks. - Dr. Celine Ahr Room 3 Anesthetic: (In clinic) Topical Lidocaine 4% applied to wound bed Bathing/ Shower/ Hygiene: May shower and wash wound with soap and water. - use antibacterial soap to wash legs and wounds when changing dressing. right leg Edema Control - Lymphedema / SCD / Other: Elevate legs to the level of the heart or above for 30 minutes daily and/or when sitting, a frequency of: - throughout the day Avoid standing for long periods of time. Off-Loading: Prevalon Boot - Left foot Prevalon Boot Additional Orders / Instructions: Follow Nutritious Diet - Monitor/Control Blood Sugars The following medication(s) was prescribed: lidocaine topical 4 % cream cream topical was prescribed at facility WOUND #13: - Lower Leg Wound Laterality: Left, Circumferential Cleanser: Soap and Water Every Other Day/30 Days Discharge Instructions: May shower and wash wound with dial antibacterial soap and water prior to dressing change. Peri-Wound Care: Sween Lotion (Moisturizing lotion) Every Other Day/30 Days Discharge Instructions: Apply moisturizing lotion as directed Prim Dressing: Iodosorb Gel 10 (gm) Tube (DME) (Generic) Every Other Day/30 Days ary Discharge Instructions: Apply to wound bed as instructed Secondary Dressing: ABD Pad, 5x9 (DME) (Generic) Every Other Day/30 Days Discharge Instructions: Apply over primary dressing as directed. Secondary Dressing: Woven Gauze Sponge, Non-Sterile 4x4 in (DME) (Generic) Every Other Day/30 Days Discharge Instructions: Apply over primary dressing as directed. Secured With: [employer], 4.5x3.1 (in/yd) (DME) (Generic) Every Other Day/30  Days Discharge Instructions: Secure with Kerlix as directed. Secured With: 68M Medipore H Soft Cloth Surgical T ape, 4 x 10 (in/yd) (DME) (Generic) Every Other Day/30 Days Discharge Instructions: Secure with tape as directed. WOUND #14: - Foot Wound Laterality: Dorsal, Left Cleanser: Soap and Water Every Other Day/30 Days Discharge Instructions: May shower and wash wound with dial antibacterial soap and water prior to dressing change. Peri-Wound Care: Sween Lotion (Moisturizing lotion) Every Other Day/30 Days Discharge Instructions: Apply moisturizing lotion as directed Prim Dressing: Iodosorb Gel 10 (gm) Tube (DME) (Generic) Every Other Day/30 Days ary Discharge Instructions: Apply to wound bed as instructed Secondary Dressing: ABD Pad, 5x9 (DME) (Generic) Every Other Day/30 Days Discharge Instructions: Apply over primary dressing as directed. Secondary Dressing: Woven Gauze Sponge, Non-Sterile 4x4 in (DME) (Generic) Every Other Day/30 Days Discharge Instructions: Apply over primary dressing as directed. Secured With: [employer], 4.5x3.1 (in/yd) (DME) (Generic) Every Other Day/30 Days DEVONDRE, GUZZETTA ([id number]) [id number] Page 9 of 10 Discharge Instructions: Secure with Kerlix as directed. Secured With: 68M Medipore H Soft Cloth Surgical T ape, 4 x 10 (in/yd) (DME) (Generic) Every Other Day/30 Days Discharge Instructions: Secure with tape as directed. WOUND #17: - Amputation Site - Transmetatarsal Wound Laterality: Left Cleanser: Soap and Water Every Other Day/30 Days Discharge Instructions: May shower and wash wound with dial antibacterial soap and water prior to dressing change. Peri-Wound  Care: Sween Lotion (Moisturizing lotion) Every Other Day/30 Days Discharge Instructions: Apply moisturizing lotion as directed Prim Dressing: Iodosorb Gel 10 (gm) Tube (DME) (Generic) Every Other Day/30 Days ary Discharge Instructions: Apply to wound bed as  instructed Secondary Dressing: ABD Pad, 5x9 (DME) (Generic) Every Other Day/30 Days Discharge Instructions: Apply over primary dressing as directed. Secondary Dressing: Woven Gauze Sponge, Non-Sterile 4x4 in (DME) (Generic) Every Other Day/30 Days Discharge Instructions: Apply over primary dressing as directed. Secured With: [employer], 4.5x3.1 (in/yd) (DME) (Generic) Every Other Day/30 Days Discharge Instructions: Secure with Kerlix as directed. Secured With: 24M Medipore H Soft Cloth Surgical T ape, 4 x 10 (in/yd) (DME) (Generic) Every Other Day/30 Days Discharge Instructions: Secure with tape as directed. WOUND #18: - Calcaneus Wound Laterality: Left Cleanser: Soap and Water Every Other Day/30 Days Discharge Instructions: May shower and wash wound with dial antibacterial soap and water prior to dressing change. Peri-Wound Care: Sween Lotion (Moisturizing lotion) Every Other Day/30 Days Discharge Instructions: Apply moisturizing lotion as directed Prim Dressing: [employer] Dressing 6x6 (in/in) (DME) (Generic) Every Other Day/30 Days ary Discharge Instructions: Apply to wound bed as instructed Secondary Dressing: ABD Pad, 5x9 (DME) (Generic) Every Other Day/30 Days Discharge Instructions: Apply over primary dressing as directed. Secondary Dressing: Woven Gauze Sponge, Non-Sterile 4x4 in (DME) (Generic) Every Other Day/30 Days Discharge Instructions: Apply over primary dressing as directed. Secured With: [employer], 4.5x3.1 (in/yd) (DME) (Generic) Every Other Day/30 Days Discharge Instructions: Secure with Kerlix as directed. Secured With: 24M Medipore H Soft Cloth Surgical T ape, 4 x 10 (in/yd) (DME) (Generic) Every Other Day/30 Days Discharge Instructions: Secure with tape as directed. . #1 I think this patient's foot is ischemic and I am very doubtful that this is going to be salvageable. 2. I suspect he is going to require an amputation and I talked to him about  this. 3. Nothing looks acutely infected, he is not in a lot of pain probably because of neuropathy and nothing looks like it is urgent however I cannot imagine that even with revascularization that this is going to heal 4. I will see if I can send a secure message to Dr. Donzetta Matters who is seeing him in 1 week time. 5. I did review his arteriogram from May. At that point it was reasonably unimpressive he did have a 50% stenosis of the left popliteal artery perhaps that is worsened. He had a dominant pop peroneal no other comment about the other tibial vessels. Electronic Signature(s) Signed: 07/31/2022 5:47:10 PM By: Linton Ham MD Entered By: Linton Ham on 07/31/2022 12:35:48 -------------------------------------------------------------------------------- SuperBill Details Patient Name: Date of Service: Edward Leeds, [place] MES L. 07/31/2022 Medical Record Number: [id number] Patient Account Number: [id number] Date of Birth/Sex: Treating RN: October 22, 1970 (51 y.o. 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 N18.6 End stage renal disease Z89.611 Acquired absence of right leg above knee Z89.432 Acquired absence of left foot I50.42 Chronic combined systolic (congestive) and diastolic (congestive) heart failure I73.9 Peripheral vascular disease, unspecified JHALEN, ELEY ([id number]) [id number] Page 10 of 10 E11.52 Type 2 diabetes mellitus with diabetic peripheral angiopathy with gangrene L97.522 Non-pressure chronic ulcer of other part of left foot with fat layer exposed L97.325 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  without evidence of necrosis L97.822  Non-pressure chronic ulcer of other part of left lower leg with fat layer exposed L97.422 Non-pressure chronic ulcer of left heel and midfoot with fat layer exposed Facility Procedures : CPT4 Code: 91694503 Description: 88828 - DEBRIDE WOUND 1ST 20 SQ CM OR < ICD-10 Diagnosis Description L97.422 Non-pressure chronic ulcer of left heel and midfoot with fat layer exposed Modifier: Quantity: 1 : CPT4 Code: [id number] Description: 15056 - DEBRIDE WOUND EA ADDL 20 SQ CM ICD-10 Diagnosis Description L97.422 Non-pressure chronic ulcer of left heel and midfoot with fat layer exposed Modifier: Quantity: 2 Physician Procedures : CPT4 Code Description Modifier [id number] - WC PHYS DEBR WO ANESTH 20 SQ CM ICD-10 Diagnosis Description L97.422 Non-pressure chronic ulcer of left heel and midfoot with fat layer exposed Quantity: 1 : [id number] - WC PHYS DEBR WO ANESTH EA ADD 20 CM ICD-10 Diagnosis Description L97.422 Non-pressure chronic ulcer of left heel and midfoot with fat layer exposed Quantity: 2 Electronic Signature(s) Signed: 07/31/2022 5:47:10 PM By: Linton Ham MD Entered By: Linton Ham on 07/31/2022 12:36:05

## 2022-08-01 NOTE — Progress Notes (Signed)
Edward [name] ([id number]) [id number] Page 1 of 13 Visit Report for 07/31/2022 Arrival Information Details Patient Name: Date of Service: Edward [name] MES [name]. 07/31/2022 11:00 A M Medical Record Number: [id number] Patient Account Number: [id number] Date of Birth/Sex: Treating RN: 03/10/1971 (51 y.o. Edward [name] Primary Care Caylor Cerino: [name] Other Clinician: Referring Khadejah Son: Treating Alamin Mccuiston/Extender: Sherre Poot in Treatment: 11 Visit Information History Since Last Visit Added or deleted any medications: No Patient Arrived: Wheel Chair Any new allergies or adverse reactions: No Arrival Time: 11:23 Had a fall or experienced change in No Accompanied By: son activities of daily living that may affect Transfer Assistance: Manual risk of falls: Patient Identification Verified: Yes Signs or symptoms of abuse/neglect since last visito No Patient Requires Transmission-Based Precautions: No Hospitalized since last visit: No Patient Has Alerts: Yes Implantable device outside of the clinic excluding No Patient Alerts: Patient on Blood Thinner cellular tissue based products placed in the center plavix since last visit: Has Dressing in Place as Prescribed: Yes Pain Present Now: Yes Electronic Signature(s) Signed: 07/31/2022 2:56:09 PM By: Dellie Catholic RN Entered By: Dellie Catholic on 07/31/2022 11:50:32 -------------------------------------------------------------------------------- Encounter Discharge Information Details Patient Name: Date of Service: Edward [name], Edward [name] MES [name]. 07/31/2022 11:00 A M Medical Record Number: [id number] Patient Account Number: [id number] Date of Birth/Sex: Treating RN: June 29, 1971 (51 y.o. Edward [name] Primary Care Aseneth Hack: [name] Other Clinician: Referring Caryle Helgeson: Treating Kala Gassmann/Extender: Sherre Poot in Treatment: 11 Encounter Discharge  Information Items Post Procedure Vitals Discharge Condition: Stable Temperature (F): 97.9 Ambulatory Status: Wheelchair Pulse (bpm): 88 Discharge Destination: Home Respiratory Rate (breaths/min): 18 Transportation: Private Auto Blood Pressure (mmHg): 124/76 Accompanied By: son Schedule Follow-up Appointment: Yes Clinical Summary of Care: Patient Declined Electronic Signature(s) Signed: 07/31/2022 2:56:09 PM By: Dellie Catholic RN Entered By: Dellie Catholic on 07/31/2022 13:12:57 Edward [name] ([id number]) [id number] Page 2 of 13 -------------------------------------------------------------------------------- Lower Extremity Assessment Details Patient Name: Date of Service: Edward [name] MES [name]. 07/31/2022 11:00 A M Medical Record Number: [id number] Patient Account Number: [id number] Date of Birth/Sex: Treating RN: 06-03-1971 (51 y.o. Edward [name] Primary Care Zaim Nitta: [name] Other Clinician: Referring Mae Denunzio: Treating Gianella Chismar/Extender: Sherre Poot in Treatment: 11 Edema Assessment Assessed: Shirlyn Goltz: No] Patrice Paradise: No] [Left: Edema] [Right: :] Calf Left: Right: Point of Measurement: From Medial Instep 42.6 cm Ankle Left: Right: Point of Measurement: From Medial Instep 24.5 cm Vascular Assessment Pulses: Dorsalis Pedis Palpable: [Left:Yes] Electronic Signature(s) Signed: 07/31/2022 2:56:09 PM By: Dellie Catholic RN Entered By: Dellie Catholic on 07/31/2022 11:27:00 -------------------------------------------------------------------------------- Multi Wound Chart Details Patient Name: Date of Service: Edward [name], Edward [name] MES [name]. 07/31/2022 11:00 A M Medical Record Number: [id number] Patient Account Number: [id number] Date of Birth/Sex: Treating RN: [birth year] (51 y.o. M) Primary Care Colisha Redler: [name] Other Clinician: Referring Thatcher Doberstein: Treating Ranesha Val/Extender: Sherre Poot in  Treatment: 11 Vital Signs Height(in): Pulse(bpm): 88 Weight(lbs): Blood Pressure(mmHg): 124/76 Body Mass Index(BMI): Temperature(F): 97.9 Respiratory Rate(breaths/min): 18 [13:Photos:] [[id number] Page 3 of 13] Left, Circumferential Lower Leg Left, Dorsal Foot Left Amputation Site - Transmetatarsal Wound Location: Gradually Appeared Gradually Appeared Blister Wounding Event: Diabetic Wound/Ulcer of the Lower Diabetic Wound/Ulcer of the Lower Diabetic Wound/Ulcer of the Lower Primary Etiology: Extremity Extremity Extremity Cataracts, Anemia, Sleep Apnea, Cataracts, Anemia, Sleep Apnea, Cataracts, Anemia, Sleep Apnea, Comorbid History: Arrhythmia, Congestive Heart Failure, Arrhythmia, Congestive Heart Failure, Arrhythmia, Congestive Heart Failure, Coronary Artery Disease,  Coronary Artery Disease, Coronary Artery Disease, Hypertension, Myocardial Infarction, Hypertension, Myocardial Infarction, Hypertension, Myocardial Infarction, Peripheral Venous Disease, Type II Peripheral Venous Disease, Type II Peripheral Venous Disease, Type II Diabetes, End Stage Renal Disease, Diabetes, End Stage Renal Disease, Diabetes, End Stage Renal Disease, Osteoarthritis, Osteomyelitis, Osteoarthritis, Osteomyelitis, Osteoarthritis, Osteomyelitis, Neuropathy, Confinement Anxiety Neuropathy, Confinement Anxiety Neuropathy, Confinement Anxiety 06/18/2022 06/18/2022 07/12/2022 Date A cquired: '5 5 1 '$ Weeks of Treatment: Open Open Open Wound Status: No No No Wound Recurrence: 30.7x26x0.3 1.6x2.2x0.2 1.5x2.2x0.1 Measurements [name] x W x D (cm) 626.905 2.765 2.592 A (cm) : rea 188.071 0.553 0.259 Volume (cm) : -169.70% -166.60% 34.00% % Reduction in A rea: -709.00% -167.10% 67.00% % Reduction in Volume: Grade 2 Grade 2 Grade 1 Classification: Medium Medium Medium Exudate A mount: Serosanguineous Serosanguineous Serosanguineous Exudate Type: red, brown red, brown red,  brown Exudate Color: Distinct, outline attached Distinct, outline attached Distinct, outline attached Wound Margin: Small (1-33%) Small (1-33%) Large (67-100%) Granulation A mount: Pink Pink Red Granulation Quality: Large (67-100%) Large (67-100%) Small (1-33%) Necrotic A mount: Eschar, Adherent Slough Eschar, Adherent Slough Eschar, Adherent Slough Necrotic Tissue: Fat Layer (Subcutaneous Tissue): Yes Fat Layer (Subcutaneous Tissue): Yes Fat Layer (Subcutaneous Tissue): Yes Exposed Structures: Tendon: Yes Fascia: No Fascia: No Fascia: No Tendon: No Tendon: No Muscle: No Muscle: No Muscle: No Joint: No Joint: No Joint: No Bone: No Bone: No Bone: No None None None Epithelialization: N/A N/A N/A Debridement: N/A N/A N/A Pain Control: N/A N/A N/A Tissue Debrided: N/A N/A N/A Level: N/A N/A N/A Debridement A (sq cm): rea N/A N/A N/A Instrument: N/A N/A N/A Bleeding: N/A N/A N/A Hemostasis A chieved: N/A N/A N/A Procedural Pain: N/A N/A N/A Post Procedural Pain: Debridement Treatment Response: N/A N/A N/A Post Debridement Measurements [name] x N/A N/A N/A W x D (cm) N/A N/A N/A Post Debridement Volume: (cm) N/A N/A N/A Post Debridement Stage: Excoriation: No Scarring: Yes No Abnormalities Noted Periwound Skin Texture: Induration: No Callus: No Crepitus: No Rash: No Scarring: No Dry/Scaly: Yes Dry/Scaly: Yes Dry/Scaly: Yes Periwound Skin Moisture: Maceration: No Maceration: No Atrophie Blanche: No Hemosiderin Staining: Yes No Abnormalities Noted Periwound Skin Color: Cyanosis: No Ecchymosis: No Erythema: No Hemosiderin Staining: No Mottled: No Pallor: No Rubor: No No Abnormality No Abnormality No Abnormality Temperature: N/A N/A N/A Procedures Performed: Wound Number: 18 N/A N/A Photos: N/A N/A Left Calcaneus N/A N/A Wound Location: Pressure Injury N/A N/A Wounding Event: Pressure Ulcer N/A N/A Primary Etiology: Cataracts, Anemia,  Sleep Apnea, N/A N/A Comorbid HistoryFLOYDE, DINGLEY ([id number]) [id number] Page 4 of 13 Arrhythmia, Congestive Heart Failure, Coronary Artery Disease, Hypertension, Myocardial Infarction, Peripheral Venous Disease, Type II Diabetes, End Stage Renal Disease, Osteoarthritis, Osteomyelitis, Neuropathy, Confinement Anxiety 07/31/2022 N/A N/A Date Acquired: 0 N/A N/A Weeks of Treatment: Open N/A N/A Wound Status: No N/A N/A Wound Recurrence: 5x9x0.1 N/A N/A Measurements [name] x W x D (cm) 35.343 N/A N/A A (cm) : rea 3.534 N/A N/A Volume (cm) : N/A N/A N/A % Reduction in A rea: N/A N/A N/A % Reduction in Volume: Unstageable/Unclassified N/A N/A Classification: Medium N/A N/A Exudate A mount: Serosanguineous N/A N/A Exudate Type: red, brown N/A N/A Exudate Color: Distinct, outline attached N/A N/A Wound Margin: Small (1-33%) N/A N/A Granulation A mount: Red N/A N/A Granulation Quality: Large (67-100%) N/A N/A Necrotic A mount: Eschar, Adherent Slough N/A N/A Necrotic Tissue: Fat Layer (Subcutaneous Tissue): Yes N/A N/A Exposed Structures: Fascia: No Tendon: No Muscle: No Joint: No Bone: No None N/A  N/A Epithelialization: Debridement - Selective/Open Wound N/A N/A Debridement: Pre-procedure Verification/Time Out 11:51 N/A N/A Taken: Lidocaine 4% Topical Solution N/A N/A Pain Control: Other N/A N/A Tissue Debrided: Non-Viable Tissue N/A N/A Level: 45 N/A N/A Debridement A (sq cm): rea Forceps, Scissors N/A N/A Instrument: Minimum N/A N/A Bleeding: Pressure N/A N/A Hemostasis A chieved: 0 N/A N/A Procedural Pain: 0 N/A N/A Post Procedural Pain: Procedure was tolerated well N/A N/A Debridement Treatment Response: 5x9x0.1 N/A N/A Post Debridement Measurements [name] x W x D (cm) 3.534 N/A N/A Post Debridement Volume: (cm) Unstageable/Unclassified N/A N/A Post Debridement Stage: No Abnormalities Noted N/A  N/A Periwound Skin Texture: Dry/Scaly: Yes N/A N/A Periwound Skin Moisture: No Abnormalities Noted N/A N/A Periwound Skin Color: No Abnormality N/A N/A Temperature: Debridement N/A N/A Procedures Performed: Treatment Notes Electronic Signature(s) Signed: 07/31/2022 5:47:10 PM By: Linton Ham MD Entered By: Linton Ham on 07/31/2022 12:16:46 -------------------------------------------------------------------------------- Multi-Disciplinary Care Plan Details Patient Name: Date of Service: Edward [name], Edward [name] MES [name]. 07/31/2022 11:00 A M Medical Record Number: [id number] Patient Account Number: [id number] Date of Birth/Sex: Treating RN: 1971-02-13 (51 y.o. Edward [name] Primary Care Philmore Lepore: [name] Other Clinician: Referring Emrick Hensch: Treating Deaire Mcwhirter/Extender: Sherre Poot in Treatment: [address] Edward [name], Edward [name] ([id number]) [id number] Page 5 of 13 Wound/Skin Impairment Nursing Diagnoses: Impaired tissue integrity Knowledge deficit related to ulceration/compromised skin integrity Goals: Patient/caregiver will verbalize understanding of skin care regimen Date Initiated: 05/15/2022 Target Resolution Date: 10/18/2022 Goal Status: Active Ulcer/skin breakdown will have a volume reduction of 30% by week 4 Date Initiated: 05/15/2022 Target Resolution Date: 10/18/2022 Goal Status: Active Interventions: Assess patient/caregiver ability to obtain necessary supplies Assess patient/caregiver ability to perform ulcer/skin care regimen upon admission and as needed Assess ulceration(s) every visit Provide education on ulcer and skin care Notes: Electronic Signature(s) Signed: 07/31/2022 2:56:09 PM By: Dellie Catholic RN Entered By: Dellie Catholic on 07/31/2022 11:36:09 -------------------------------------------------------------------------------- Pain Assessment Details Patient Name: Date of Service: Edward [name], Edward [name]  MES [name]. 07/31/2022 11:00 A M Medical Record Number: [id number] Patient Account Number: [id number] Date of Birth/Sex: Treating RN: [birth year] (51 y.o. Edward [name] Primary Care Diallo Ponder: [name] Other Clinician: Referring Dary Dilauro: Treating Braheem Tomasik/Extender: Sherre Poot in Treatment: 11 Active Problems Location of Pain Severity and Description of Pain Patient Has Paino Yes Site Locations Pain Location: Pain in Ulcers Duration of the Pain. Constant / Intermittento Constant Rate the pain. Current Pain Level: 5 Character of Pain Describe the Pain: Aching Pain Management and Medication Current Pain Management: Edward [name], Edward [name] ([id number]) [phone].pdf Page 6 of 13 Electronic Signature(s) Signed: 07/31/2022 2:56:09 PM By: Dellie Catholic RN Entered By: Dellie Catholic on 07/31/2022 11:26:31 -------------------------------------------------------------------------------- Patient/Caregiver Education Details Patient Name: Date of Service: Edward [name], Edward [name] MES [name]. 12/13/2023andnbsp11:00 A M Medical Record Number: [id number] Patient Account Number: [id number] Date of Birth/Gender: Treating RN: 11/07/1970 (51 y.o. Edward [name] Primary Care Physician: [name] Other Clinician: Referring Physician: Treating Physician/Extender: Sherre Poot in Treatment: 11 Education Assessment Education Provided To: Patient Education Topics Provided Wound/Skin Impairment: Methods: Explain/Verbal Responses: Reinforcements needed, State content correctly Electronic Signature(s) Signed: 07/31/2022 2:56:09 PM By: Dellie Catholic RN Entered By: Dellie Catholic on 07/31/2022 11:36:34 -------------------------------------------------------------------------------- Wound Assessment Details Patient Name: Date of Service: Edward [name], Edward [name] MES [name]. 07/31/2022 11:00 A M Medical Record Number: [id number] Patient Account Number:  [id number] Date of Birth/Sex: Treating RN: Jun 24, 1971 (51 y.o. Edward [name] Primary Care Mckala Pantaleon: Berdine Addison,  Barrie Folk Other Clinician: Referring Itay Mella: Treating Devanee Pomplun/Extender: Sherre Poot in Treatment: 11 Wound Status Wound Number: 13 Primary Diabetic Wound/Ulcer of the Lower Extremity Etiology: Wound Location: Left, Circumferential Lower Leg Wound Open Wounding Event: Gradually Appeared Status: Date Acquired: 06/18/2022 Comorbid Cataracts, Anemia, Sleep Apnea, Arrhythmia, Congestive Heart Weeks Of Treatment: 5 History: Failure, Coronary Artery Disease, Hypertension, Myocardial Clustered Wound: No Infarction, Peripheral Venous Disease, Type II Diabetes, End Stage Renal Disease, Osteoarthritis, Osteomyelitis, Neuropathy, Confinement Anxiety Photos Edward [name], [name] ([id number]) [id number] Page 7 of 13 Wound Measurements Length: (cm) 30.7 Width: (cm) 26 Depth: (cm) 0.3 Area: (cm) 626.905 Volume: (cm) 188.071 % Reduction in Area: -169.7% % Reduction in Volume: -709% Epithelialization: None Tunneling: No Undermining: No Wound Description Classification: Grade 2 Wound Margin: Distinct, outline attached Exudate Amount: Medium Exudate Type: Serosanguineous Exudate Color: red, brown Foul Odor After Cleansing: No Slough/Fibrino Yes Wound Bed Granulation Amount: Small (1-33%) Exposed Structure Granulation Quality: Pink Fascia Exposed: No Necrotic Amount: Large (67-100%) Fat Layer (Subcutaneous Tissue) Exposed: Yes Necrotic Quality: Eschar, Adherent Slough Tendon Exposed: Yes Muscle Exposed: No Joint Exposed: No Bone Exposed: No Periwound Skin Texture Texture Color No Abnormalities Noted: Yes No Abnormalities Noted: No Atrophie Blanche: No Moisture Cyanosis: No No Abnormalities Noted: No Ecchymosis: No Dry / Scaly: Yes Erythema: No Maceration: No Hemosiderin Staining: No Mottled: No Pallor: No Rubor:  No Temperature / Pain Temperature: No Abnormality Treatment Notes Wound #13 (Lower Leg) Wound Laterality: Left, Circumferential Cleanser Soap and Water Discharge Instruction: May shower and wash wound with dial antibacterial soap and water prior to dressing change. Peri-Wound Care Sween Lotion (Moisturizing lotion) Discharge Instruction: Apply moisturizing lotion as directed Topical Primary Dressing Iodosorb Gel 10 (gm) Tube Discharge Instruction: Apply to wound bed as instructed Secondary Dressing ABD Pad, 5x9 Discharge Instruction: Apply over primary dressing as directed. Woven Gauze Sponge, Non-Sterile 4x4 in Discharge Instruction: Apply over primary dressing as directed. Edward [name], Edward [name] ([id number]) [id number] Page 8 of 13 Secured With [employer], 4.5x3.1 (in/yd) Discharge Instruction: Secure with Kerlix as directed. 65M Medipore H Soft Cloth Surgical T ape, 4 x 10 (in/yd) Discharge Instruction: Secure with tape as directed. Compression Wrap Compression Stockings Add-Ons Electronic Signature(s) Signed: 07/31/2022 2:56:09 PM By: Dellie Catholic RN Entered By: Dellie Catholic on 07/31/2022 11:39:52 -------------------------------------------------------------------------------- Wound Assessment Details Patient Name: Date of Service: Edward [name], Edward [name] MES [name]. 07/31/2022 11:00 A M Medical Record Number: [id number] Patient Account Number: [id number] Date of Birth/Sex: Treating RN: 1970-12-26 (51 y.o. Edward [name] Primary Care Torrence Hammack: [name] Other Clinician: Referring Garyn Waguespack: Treating Analis Distler/Extender: Sherre Poot in Treatment: 11 Wound Status Wound Number: 14 Primary Diabetic Wound/Ulcer of the Lower Extremity Etiology: Wound Location: Left, Dorsal Foot Wound Open Wounding Event: Gradually Appeared Status: Date Acquired: 06/18/2022 Comorbid Cataracts, Anemia, Sleep Apnea, Arrhythmia, Congestive  Heart Weeks Of Treatment: 5 History: Failure, Coronary Artery Disease, Hypertension, Myocardial Clustered Wound: No Infarction, Peripheral Venous Disease, Type II Diabetes, End Stage Renal Disease, Osteoarthritis, Osteomyelitis, Neuropathy, Confinement Anxiety Photos Wound Measurements Length: (cm) 1.6 Width: (cm) 2.2 Depth: (cm) 0.2 Area: (cm) 2.765 Volume: (cm) 0.553 % Reduction in Area: -166.6% % Reduction in Volume: -167.1% Epithelialization: None Tunneling: No Undermining: No Wound Description Classification: Grade 2 Wound Margin: Distinct, outline attached Exudate Amount: Medium Exudate Type: Serosanguineous Exudate Color: red, brown Foul Odor After Cleansing: No Slough/Fibrino Yes Wound Bed Granulation Amount: Small (1-33%) Exposed Structure Granulation Quality: Pink Fascia Exposed: No Edward [name], Edward [name] ([id number]) [id number]  Page 9 of 13 Necrotic Amount: Large (67-100%) Fat Layer (Subcutaneous Tissue) Exposed: Yes Necrotic Quality: Eschar, Adherent Slough Tendon Exposed: No Muscle Exposed: No Joint Exposed: No Bone Exposed: No Periwound Skin Texture Texture Color No Abnormalities Noted: No No Abnormalities Noted: No Scarring: Yes Hemosiderin Staining: Yes Moisture Temperature / Pain No Abnormalities Noted: No Temperature: No Abnormality Dry / Scaly: Yes Maceration: No Treatment Notes Wound #14 (Foot) Wound Laterality: Dorsal, Left Cleanser Soap and Water Discharge Instruction: May shower and wash wound with dial antibacterial soap and water prior to dressing change. Peri-Wound Care Sween Lotion (Moisturizing lotion) Discharge Instruction: Apply moisturizing lotion as directed Topical Primary Dressing Iodosorb Gel 10 (gm) Tube Discharge Instruction: Apply to wound bed as instructed Secondary Dressing ABD Pad, 5x9 Discharge Instruction: Apply over primary dressing as directed. Woven Gauze Sponge, Non-Sterile 4x4  in Discharge Instruction: Apply over primary dressing as directed. Secured With [employer], 4.5x3.1 (in/yd) Discharge Instruction: Secure with Kerlix as directed. 64M Medipore H Soft Cloth Surgical T ape, 4 x 10 (in/yd) Discharge Instruction: Secure with tape as directed. Compression Wrap Compression Stockings Add-Ons Electronic Signature(s) Signed: 07/31/2022 2:56:09 PM By: Dellie Catholic RN Entered By: Dellie Catholic on 07/31/2022 11:40:24 -------------------------------------------------------------------------------- Wound Assessment Details Patient Name: Date of Service: Edward [name], Edward [name] MES [name]. 07/31/2022 11:00 A M Medical Record Number: [id number] Patient Account Number: [id number] Date of Birth/Sex: Treating RN: 02-Mar-1971 (51 y.o. Edward [name] Primary Care Zarria Towell: [name] Other Clinician: Referring Johnda Billiot: Treating Naydene Kamrowski/Extender: Augusto Gamble Weeks in Treatment: 11 Wound Status Wound Number: 17 Primary Diabetic Wound/Ulcer of the Lower Extremity Etiology: Edward [name], Edward [name] ([id number]) [id number] Page 10 of 13 Etiology: Wound Location: Left Amputation Site - Transmetatarsal Wound Open Wounding Event: Blister Status: Date Acquired: 07/12/2022 Comorbid Cataracts, Anemia, Sleep Apnea, Arrhythmia, Congestive Heart Weeks Of Treatment: 1 History: Failure, Coronary Artery Disease, Hypertension, Myocardial Clustered Wound: No Infarction, Peripheral Venous Disease, Type II Diabetes, End Stage Renal Disease, Osteoarthritis, Osteomyelitis, Neuropathy, Confinement Anxiety Photos Wound Measurements Length: (cm) 1.5 Width: (cm) 2.2 Depth: (cm) 0.1 Area: (cm) 2.592 Volume: (cm) 0.259 % Reduction in Area: 34% % Reduction in Volume: 67% Epithelialization: None Tunneling: No Undermining: No Wound Description Classification: Grade 1 Wound Margin: Distinct, outline attached Exudate Amount: Medium Exudate  Type: Serosanguineous Exudate Color: red, brown Foul Odor After Cleansing: No Slough/Fibrino Yes Wound Bed Granulation Amount: Large (67-100%) Exposed Structure Granulation Quality: Red Fascia Exposed: No Necrotic Amount: Small (1-33%) Fat Layer (Subcutaneous Tissue) Exposed: Yes Necrotic Quality: Eschar, Adherent Slough Tendon Exposed: No Muscle Exposed: No Joint Exposed: No Bone Exposed: No Periwound Skin Texture Texture Color No Abnormalities Noted: Yes No Abnormalities Noted: Yes Moisture Temperature / Pain No Abnormalities Noted: No Temperature: No Abnormality Dry / Scaly: Yes Treatment Notes Wound #17 (Amputation Site - Transmetatarsal) Wound Laterality: Left Cleanser Soap and Water Discharge Instruction: May shower and wash wound with dial antibacterial soap and water prior to dressing change. Peri-Wound Care Sween Lotion (Moisturizing lotion) Discharge Instruction: Apply moisturizing lotion as directed Topical Primary Dressing Iodosorb Gel 10 (gm) Tube Discharge Instruction: Apply to wound bed as instructed Secondary Dressing ABD Pad, 5x9 Discharge Instruction: Apply over primary dressing as directed. Edward [name], Edward [name] ([id number]) [id number] Page 11 of 13 Woven Gauze Sponge, Non-Sterile 4x4 in Discharge Instruction: Apply over primary dressing as directed. Secured With [employer], 4.5x3.1 (in/yd) Discharge Instruction: Secure with Kerlix as directed. 64M Medipore H Soft Cloth Surgical T ape, 4 x 10 (in/yd) Discharge Instruction: Secure  with tape as directed. Compression Wrap Compression Stockings Add-Ons Electronic Signature(s) Signed: 07/31/2022 2:56:09 PM By: Dellie Catholic RN Entered By: Dellie Catholic on 07/31/2022 11:40:53 -------------------------------------------------------------------------------- Wound Assessment Details Patient Name: Date of Service: Edward [name], Edward [name] MES [name]. 07/31/2022 11:00 A M Medical Record  Number: [id number] Patient Account Number: [id number] Date of Birth/Sex: Treating RN: 1971-02-26 (51 y.o. Edward [name] Primary Care Zahria Ding: [name] Other Clinician: Referring Taige Housman: Treating Kathryne Ramella/Extender: Sherre Poot in Treatment: 11 Wound Status Wound Number: 18 Primary Pressure Ulcer Etiology: Wound Location: Left Calcaneus Wound Open Wounding Event: Pressure Injury Status: Date Acquired: 07/31/2022 Comorbid Cataracts, Anemia, Sleep Apnea, Arrhythmia, Congestive Heart Weeks Of Treatment: 0 History: Failure, Coronary Artery Disease, Hypertension, Myocardial Clustered Wound: No Infarction, Peripheral Venous Disease, Type II Diabetes, End Stage Renal Disease, Osteoarthritis, Osteomyelitis, Neuropathy, Confinement Anxiety Photos Wound Measurements Length: (cm) 5 Width: (cm) 9 Depth: (cm) 0.1 Area: (cm) 35.343 Volume: (cm) 3.534 % Reduction in Area: % Reduction in Volume: Epithelialization: None Tunneling: No Undermining: No Wound Description Classification: Unstageable/Unclassified Wound Margin: Distinct, outline attached Exudate Amount: Medium Exudate Type: Serosanguineous Exudate Color: red, brown Edward [name], Edward [name] ([id number]) Wound Bed Granulation Amount: Small (1-33%) Granulation Quality: Red Necrotic Amount: Large (67-100%) Necrotic Quality: Eschar, Adherent Slough Foul Odor After Cleansing: No Slough/Fibrino Yes [phone].pdf Page 12 of 13 Exposed Structure Fascia Exposed: No Fat Layer (Subcutaneous Tissue) Exposed: Yes Tendon Exposed: No Muscle Exposed: No Joint Exposed: No Bone Exposed: No Periwound Skin Texture Texture Color No Abnormalities Noted: Yes No Abnormalities Noted: Yes Moisture Temperature / Pain No Abnormalities Noted: No Temperature: No Abnormality Dry / Scaly: Yes Treatment Notes Wound #18 (Calcaneus) Wound Laterality: Left Cleanser Soap and Water Discharge  Instruction: May shower and wash wound with dial antibacterial soap and water prior to dressing change. Peri-Wound Care Sween Lotion (Moisturizing lotion) Discharge Instruction: Apply moisturizing lotion as directed Topical Primary Dressing Sorbalgon AG Dressing 6x6 (in/in) Discharge Instruction: Apply to wound bed as instructed Secondary Dressing ABD Pad, 5x9 Discharge Instruction: Apply over primary dressing as directed. Woven Gauze Sponge, Non-Sterile 4x4 in Discharge Instruction: Apply over primary dressing as directed. Secured With [employer], 4.5x3.1 (in/yd) Discharge Instruction: Secure with Kerlix as directed. 6M Medipore H Soft Cloth Surgical T ape, 4 x 10 (in/yd) Discharge Instruction: Secure with tape as directed. Compression Wrap Compression Stockings Add-Ons Electronic Signature(s) Signed: 07/31/2022 2:56:09 PM By: Dellie Catholic RN Entered By: Dellie Catholic on 07/31/2022 11:39:18 -------------------------------------------------------------------------------- Vitals Details Patient Name: Date of Service: Edward [name], JA MES [name]. 07/31/2022 11:00 A M Medical Record Number: [id number] Patient Account Number: [id number] Date of Birth/Sex: Treating RN: 1970-09-20 (51 y.o. Edward [name] Primary Care Kaiden Dardis: [name] Other Clinician: Referring Bronsen Serano: Treating Jolena Kittle/Extender: Sherre Poot in Treatment: [address] Edward [name], Edward [name] ([id number]) [id number] Page 13 of 13 Time Taken: 11:24 Temperature (F): 97.9 Pulse (bpm): 88 Respiratory Rate (breaths/min): 18 Blood Pressure (mmHg): 124/76 Reference Range: 80 - 120 mg / dl Electronic Signature(s) Signed: 07/31/2022 2:56:09 PM By: Dellie Catholic RN Entered By: Dellie Catholic on 07/31/2022 11:24:13

## 2022-08-02 DIAGNOSIS — S68119D Complete traumatic metacarpophalangeal amputation of unspecified finger, subsequent encounter: Secondary | ICD-10-CM | POA: Diagnosis not present

## 2022-08-02 DIAGNOSIS — S81802A Unspecified open wound, left lower leg, initial encounter: Secondary | ICD-10-CM | POA: Diagnosis not present

## 2022-08-02 DIAGNOSIS — I998 Other disorder of circulatory system: Secondary | ICD-10-CM | POA: Diagnosis not present

## 2022-08-02 DIAGNOSIS — T148XXA Other injury of unspecified body region, initial encounter: Secondary | ICD-10-CM | POA: Diagnosis not present

## 2022-08-02 DIAGNOSIS — I96 Gangrene, not elsewhere classified: Secondary | ICD-10-CM | POA: Diagnosis not present

## 2022-08-02 DIAGNOSIS — S81001A Unspecified open wound, right knee, initial encounter: Secondary | ICD-10-CM | POA: Diagnosis not present

## 2022-08-02 DIAGNOSIS — S91002A Unspecified open wound, left ankle, initial encounter: Secondary | ICD-10-CM | POA: Diagnosis not present

## 2022-08-03 DIAGNOSIS — T8249XD Other complication of vascular dialysis catheter, subsequent encounter: Secondary | ICD-10-CM | POA: Diagnosis not present

## 2022-08-03 DIAGNOSIS — N2581 Secondary hyperparathyroidism of renal origin: Secondary | ICD-10-CM | POA: Diagnosis not present

## 2022-08-03 DIAGNOSIS — D631 Anemia in chronic kidney disease: Secondary | ICD-10-CM | POA: Diagnosis not present

## 2022-08-03 DIAGNOSIS — N186 End stage renal disease: Secondary | ICD-10-CM | POA: Diagnosis not present

## 2022-08-03 DIAGNOSIS — R197 Diarrhea, unspecified: Secondary | ICD-10-CM | POA: Diagnosis not present

## 2022-08-03 DIAGNOSIS — D689 Coagulation defect, unspecified: Secondary | ICD-10-CM | POA: Diagnosis not present

## 2022-08-03 DIAGNOSIS — L299 Pruritus, unspecified: Secondary | ICD-10-CM | POA: Diagnosis not present

## 2022-08-03 DIAGNOSIS — Z992 Dependence on renal dialysis: Secondary | ICD-10-CM | POA: Diagnosis not present

## 2022-08-03 DIAGNOSIS — R52 Pain, unspecified: Secondary | ICD-10-CM | POA: Diagnosis not present

## 2022-08-05 ENCOUNTER — Encounter (HOSPITAL_COMMUNITY): Payer: Self-pay

## 2022-08-05 ENCOUNTER — Inpatient Hospital Stay (HOSPITAL_COMMUNITY)
Admission: EM | Admit: 2022-08-05 | Discharge: 2022-08-13 | DRG: 239 | Disposition: A | Discharge status: Rehabilitation Facility | Payer: Medicare Other | Attending: Internal Medicine | Admitting: Internal Medicine

## 2022-08-05 ENCOUNTER — Emergency Department (HOSPITAL_COMMUNITY): Payer: Medicare Other

## 2022-08-05 ENCOUNTER — Other Ambulatory Visit: Payer: Self-pay

## 2022-08-05 DIAGNOSIS — E875 Hyperkalemia: Secondary | ICD-10-CM

## 2022-08-05 DIAGNOSIS — K219 Gastro-esophageal reflux disease without esophagitis: Secondary | ICD-10-CM | POA: Diagnosis present

## 2022-08-05 DIAGNOSIS — Z1152 Encounter for screening for COVID-19: Secondary | ICD-10-CM | POA: Diagnosis not present

## 2022-08-05 DIAGNOSIS — M1990 Unspecified osteoarthritis, unspecified site: Secondary | ICD-10-CM | POA: Diagnosis not present

## 2022-08-05 DIAGNOSIS — Z888 Allergy status to other drugs, medicaments and biological substances status: Secondary | ICD-10-CM

## 2022-08-05 DIAGNOSIS — I5042 Chronic combined systolic (congestive) and diastolic (congestive) heart failure: Secondary | ICD-10-CM | POA: Diagnosis present

## 2022-08-05 DIAGNOSIS — I951 Orthostatic hypotension: Secondary | ICD-10-CM | POA: Diagnosis present

## 2022-08-05 DIAGNOSIS — R7401 Elevation of levels of liver transaminase levels: Secondary | ICD-10-CM | POA: Diagnosis not present

## 2022-08-05 DIAGNOSIS — T8744 Infection of amputation stump, left lower extremity: Secondary | ICD-10-CM | POA: Diagnosis not present

## 2022-08-05 DIAGNOSIS — N186 End stage renal disease: Secondary | ICD-10-CM

## 2022-08-05 DIAGNOSIS — Z992 Dependence on renal dialysis: Secondary | ICD-10-CM | POA: Diagnosis not present

## 2022-08-05 DIAGNOSIS — I509 Heart failure, unspecified: Secondary | ICD-10-CM | POA: Diagnosis not present

## 2022-08-05 DIAGNOSIS — N2581 Secondary hyperparathyroidism of renal origin: Secondary | ICD-10-CM | POA: Diagnosis not present

## 2022-08-05 DIAGNOSIS — Z89612 Acquired absence of left leg above knee: Secondary | ICD-10-CM | POA: Diagnosis not present

## 2022-08-05 DIAGNOSIS — E1142 Type 2 diabetes mellitus with diabetic polyneuropathy: Secondary | ICD-10-CM | POA: Diagnosis present

## 2022-08-05 DIAGNOSIS — E871 Hypo-osmolality and hyponatremia: Secondary | ICD-10-CM | POA: Diagnosis not present

## 2022-08-05 DIAGNOSIS — R748 Abnormal levels of other serum enzymes: Secondary | ICD-10-CM | POA: Diagnosis present

## 2022-08-05 DIAGNOSIS — I132 Hypertensive heart and chronic kidney disease with heart failure and with stage 5 chronic kidney disease, or end stage renal disease: Secondary | ICD-10-CM | POA: Diagnosis not present

## 2022-08-05 DIAGNOSIS — Z833 Family history of diabetes mellitus: Secondary | ICD-10-CM

## 2022-08-05 DIAGNOSIS — Z87891 Personal history of nicotine dependence: Secondary | ICD-10-CM | POA: Diagnosis not present

## 2022-08-05 DIAGNOSIS — Z79899 Other long term (current) drug therapy: Secondary | ICD-10-CM | POA: Diagnosis not present

## 2022-08-05 DIAGNOSIS — Z794 Long term (current) use of insulin: Secondary | ICD-10-CM | POA: Diagnosis not present

## 2022-08-05 DIAGNOSIS — N250 Renal osteodystrophy: Secondary | ICD-10-CM | POA: Diagnosis not present

## 2022-08-05 DIAGNOSIS — E11621 Type 2 diabetes mellitus with foot ulcer: Secondary | ICD-10-CM | POA: Diagnosis not present

## 2022-08-05 DIAGNOSIS — Z800 Family history of malignant neoplasm of digestive organs: Secondary | ICD-10-CM

## 2022-08-05 DIAGNOSIS — I70202 Unspecified atherosclerosis of native arteries of extremities, left leg: Secondary | ICD-10-CM | POA: Diagnosis not present

## 2022-08-05 DIAGNOSIS — R6510 Systemic inflammatory response syndrome (SIRS) of non-infectious origin without acute organ dysfunction: Secondary | ICD-10-CM | POA: Diagnosis not present

## 2022-08-05 DIAGNOSIS — L97509 Non-pressure chronic ulcer of other part of unspecified foot with unspecified severity: Secondary | ICD-10-CM | POA: Diagnosis not present

## 2022-08-05 DIAGNOSIS — E1152 Type 2 diabetes mellitus with diabetic peripheral angiopathy with gangrene: Secondary | ICD-10-CM | POA: Diagnosis not present

## 2022-08-05 DIAGNOSIS — Z7902 Long term (current) use of antithrombotics/antiplatelets: Secondary | ICD-10-CM

## 2022-08-05 DIAGNOSIS — I96 Gangrene, not elsewhere classified: Secondary | ICD-10-CM | POA: Diagnosis not present

## 2022-08-05 DIAGNOSIS — I70262 Atherosclerosis of native arteries of extremities with gangrene, left leg: Secondary | ICD-10-CM | POA: Diagnosis not present

## 2022-08-05 DIAGNOSIS — Z89611 Acquired absence of right leg above knee: Secondary | ICD-10-CM

## 2022-08-05 DIAGNOSIS — Z993 Dependence on wheelchair: Secondary | ICD-10-CM

## 2022-08-05 DIAGNOSIS — Z89021 Acquired absence of right finger(s): Secondary | ICD-10-CM

## 2022-08-05 DIAGNOSIS — Z89432 Acquired absence of left foot: Secondary | ICD-10-CM

## 2022-08-05 DIAGNOSIS — K8020 Calculus of gallbladder without cholecystitis without obstruction: Secondary | ICD-10-CM | POA: Diagnosis present

## 2022-08-05 DIAGNOSIS — D631 Anemia in chronic kidney disease: Secondary | ICD-10-CM | POA: Diagnosis not present

## 2022-08-05 DIAGNOSIS — E118 Type 2 diabetes mellitus with unspecified complications: Secondary | ICD-10-CM

## 2022-08-05 DIAGNOSIS — L97428 Non-pressure chronic ulcer of left heel and midfoot with other specified severity: Secondary | ICD-10-CM | POA: Diagnosis present

## 2022-08-05 DIAGNOSIS — R0902 Hypoxemia: Secondary | ICD-10-CM | POA: Diagnosis not present

## 2022-08-05 DIAGNOSIS — I255 Ischemic cardiomyopathy: Secondary | ICD-10-CM | POA: Diagnosis present

## 2022-08-05 DIAGNOSIS — R112 Nausea with vomiting, unspecified: Secondary | ICD-10-CM | POA: Diagnosis present

## 2022-08-05 DIAGNOSIS — E1122 Type 2 diabetes mellitus with diabetic chronic kidney disease: Secondary | ICD-10-CM | POA: Diagnosis present

## 2022-08-05 DIAGNOSIS — Z96642 Presence of left artificial hip joint: Secondary | ICD-10-CM | POA: Diagnosis present

## 2022-08-05 DIAGNOSIS — I959 Hypotension, unspecified: Secondary | ICD-10-CM | POA: Diagnosis not present

## 2022-08-05 DIAGNOSIS — R531 Weakness: Secondary | ICD-10-CM | POA: Diagnosis not present

## 2022-08-05 DIAGNOSIS — M898X9 Other specified disorders of bone, unspecified site: Secondary | ICD-10-CM | POA: Diagnosis present

## 2022-08-05 DIAGNOSIS — I739 Peripheral vascular disease, unspecified: Secondary | ICD-10-CM | POA: Diagnosis present

## 2022-08-05 DIAGNOSIS — S78112A Complete traumatic amputation at level between left hip and knee, initial encounter: Secondary | ICD-10-CM | POA: Diagnosis not present

## 2022-08-05 DIAGNOSIS — K5900 Constipation, unspecified: Secondary | ICD-10-CM | POA: Diagnosis present

## 2022-08-05 DIAGNOSIS — L97828 Non-pressure chronic ulcer of other part of left lower leg with other specified severity: Secondary | ICD-10-CM | POA: Diagnosis not present

## 2022-08-05 DIAGNOSIS — Z515 Encounter for palliative care: Secondary | ICD-10-CM

## 2022-08-05 DIAGNOSIS — G4730 Sleep apnea, unspecified: Secondary | ICD-10-CM | POA: Diagnosis not present

## 2022-08-05 DIAGNOSIS — R001 Bradycardia, unspecified: Secondary | ICD-10-CM | POA: Diagnosis present

## 2022-08-05 DIAGNOSIS — E1151 Type 2 diabetes mellitus with diabetic peripheral angiopathy without gangrene: Secondary | ICD-10-CM | POA: Diagnosis not present

## 2022-08-05 DIAGNOSIS — L299 Pruritus, unspecified: Secondary | ICD-10-CM | POA: Diagnosis present

## 2022-08-05 DIAGNOSIS — E8779 Other fluid overload: Secondary | ICD-10-CM | POA: Diagnosis not present

## 2022-08-05 DIAGNOSIS — I10 Essential (primary) hypertension: Secondary | ICD-10-CM | POA: Diagnosis present

## 2022-08-05 DIAGNOSIS — R945 Abnormal results of liver function studies: Secondary | ICD-10-CM | POA: Diagnosis not present

## 2022-08-05 DIAGNOSIS — D638 Anemia in other chronic diseases classified elsewhere: Secondary | ICD-10-CM | POA: Diagnosis not present

## 2022-08-05 DIAGNOSIS — Z885 Allergy status to narcotic agent status: Secondary | ICD-10-CM

## 2022-08-05 DIAGNOSIS — F32A Depression, unspecified: Secondary | ICD-10-CM | POA: Diagnosis not present

## 2022-08-05 DIAGNOSIS — E88819 Insulin resistance, unspecified: Secondary | ICD-10-CM | POA: Diagnosis not present

## 2022-08-05 DIAGNOSIS — L03116 Cellulitis of left lower limb: Secondary | ICD-10-CM | POA: Diagnosis not present

## 2022-08-05 DIAGNOSIS — D72829 Elevated white blood cell count, unspecified: Secondary | ICD-10-CM | POA: Diagnosis present

## 2022-08-05 DIAGNOSIS — E1165 Type 2 diabetes mellitus with hyperglycemia: Secondary | ICD-10-CM | POA: Diagnosis present

## 2022-08-05 DIAGNOSIS — I2510 Atherosclerotic heart disease of native coronary artery without angina pectoris: Secondary | ICD-10-CM | POA: Diagnosis present

## 2022-08-05 DIAGNOSIS — E8889 Other specified metabolic disorders: Secondary | ICD-10-CM | POA: Diagnosis not present

## 2022-08-05 DIAGNOSIS — E8721 Acute metabolic acidosis: Secondary | ICD-10-CM | POA: Diagnosis not present

## 2022-08-05 DIAGNOSIS — Z7982 Long term (current) use of aspirin: Secondary | ICD-10-CM

## 2022-08-05 DIAGNOSIS — Z4781 Encounter for orthopedic aftercare following surgical amputation: Secondary | ICD-10-CM | POA: Diagnosis not present

## 2022-08-05 DIAGNOSIS — R110 Nausea: Secondary | ICD-10-CM | POA: Diagnosis not present

## 2022-08-05 DIAGNOSIS — Z955 Presence of coronary angioplasty implant and graft: Secondary | ICD-10-CM

## 2022-08-05 DIAGNOSIS — F418 Other specified anxiety disorders: Secondary | ICD-10-CM | POA: Diagnosis not present

## 2022-08-05 DIAGNOSIS — Z7189 Other specified counseling: Secondary | ICD-10-CM | POA: Diagnosis not present

## 2022-08-05 DIAGNOSIS — I252 Old myocardial infarction: Secondary | ICD-10-CM

## 2022-08-05 LAB — I-STAT CHEM 8, ED
BUN: 84 mg/dL — ABNORMAL HIGH (ref 6–20)
Calcium, Ion: 1.03 mmol/L — ABNORMAL LOW (ref 1.15–1.40)
Chloride: 100 mmol/L (ref 98–111)
Creatinine, Ser: 8.9 mg/dL — ABNORMAL HIGH (ref 0.61–1.24)
Glucose, Bld: 102 mg/dL — ABNORMAL HIGH (ref 70–99)
HCT: 38 % — ABNORMAL LOW (ref 39.0–52.0)
Hemoglobin: 12.9 g/dL — ABNORMAL LOW (ref 13.0–17.0)
Potassium: 7 mmol/L (ref 3.5–5.1)
Sodium: 135 mmol/L (ref 135–145)
TCO2: 15 mmol/L — ABNORMAL LOW (ref 22–32)

## 2022-08-05 LAB — CBC WITH DIFFERENTIAL/PLATELET
Abs Immature Granulocytes: 0.22 10*3/uL — ABNORMAL HIGH (ref 0.00–0.07)
Basophils Absolute: 0 10*3/uL (ref 0.0–0.1)
Basophils Relative: 0 %
Eosinophils Absolute: 0 10*3/uL (ref 0.0–0.5)
Eosinophils Relative: 0 %
HCT: 36.3 % — ABNORMAL LOW (ref 39.0–52.0)
Hemoglobin: 11.1 g/dL — ABNORMAL LOW (ref 13.0–17.0)
Immature Granulocytes: 2 %
Lymphocytes Relative: 9 %
Lymphs Abs: 1.1 10*3/uL (ref 0.7–4.0)
MCH: 30 pg (ref 26.0–34.0)
MCHC: 30.6 g/dL (ref 30.0–36.0)
MCV: 98.1 fL (ref 80.0–100.0)
Monocytes Absolute: 1 10*3/uL (ref 0.1–1.0)
Monocytes Relative: 8 %
Neutro Abs: 9.7 10*3/uL — ABNORMAL HIGH (ref 1.7–7.7)
Neutrophils Relative %: 81 %
Platelets: 270 10*3/uL (ref 150–400)
RBC: 3.7 MIL/uL — ABNORMAL LOW (ref 4.22–5.81)
RDW: 17.2 % — ABNORMAL HIGH (ref 11.5–15.5)
WBC: 12 10*3/uL — ABNORMAL HIGH (ref 4.0–10.5)
nRBC: 0.5 % — ABNORMAL HIGH (ref 0.0–0.2)

## 2022-08-05 LAB — BLOOD GAS, VENOUS
Acid-Base Excess: 0.9 mmol/L (ref 0.0–2.0)
Bicarbonate: 23.8 mmol/L (ref 20.0–28.0)
Drawn by: 164
O2 Saturation: 77.1 %
Patient temperature: 37
pCO2, Ven: 32 mmHg — ABNORMAL LOW (ref 44–60)
pH, Ven: 7.48 — ABNORMAL HIGH (ref 7.25–7.43)
pO2, Ven: 50 mmHg — ABNORMAL HIGH (ref 32–45)

## 2022-08-05 LAB — HEPATITIS B SURFACE ANTIGEN: Hepatitis B Surface Ag: NONREACTIVE

## 2022-08-05 LAB — RESP PANEL BY RT-PCR (RSV, FLU A&B, COVID)  RVPGX2
Influenza A by PCR: NEGATIVE
Influenza B by PCR: NEGATIVE
Resp Syncytial Virus by PCR: NEGATIVE
SARS Coronavirus 2 by RT PCR: NEGATIVE

## 2022-08-05 LAB — PROCALCITONIN: Procalcitonin: 11.58 ng/mL

## 2022-08-05 LAB — MAGNESIUM: Magnesium: 2.8 mg/dL — ABNORMAL HIGH (ref 1.7–2.4)

## 2022-08-05 LAB — LACTATE DEHYDROGENASE: LDH: 499 U/L — ABNORMAL HIGH (ref 98–192)

## 2022-08-05 LAB — CK: Total CK: 221 U/L (ref 49–397)

## 2022-08-05 LAB — LACTIC ACID, PLASMA: Lactic Acid, Venous: 7.1 mmol/L (ref 0.5–1.9)

## 2022-08-05 LAB — LIPASE, BLOOD: Lipase: 24 U/L (ref 11–51)

## 2022-08-05 LAB — GLUCOSE, CAPILLARY: Glucose-Capillary: 135 mg/dL — ABNORMAL HIGH (ref 70–99)

## 2022-08-05 MED ORDER — INSULIN ASPART 100 UNIT/ML IJ SOLN
0.0000 [IU] | Freq: Three times a day (TID) | INTRAMUSCULAR | Status: DC
  Administered 2022-08-06: 3 [IU] via SUBCUTANEOUS
  Administered 2022-08-07 – 2022-08-09 (×3): 2 [IU] via SUBCUTANEOUS
  Administered 2022-08-10: 1 [IU] via SUBCUTANEOUS
  Administered 2022-08-11: 2 [IU] via SUBCUTANEOUS
  Administered 2022-08-11 – 2022-08-12 (×3): 1 [IU] via SUBCUTANEOUS
  Administered 2022-08-13: 2 [IU] via SUBCUTANEOUS
  Administered 2022-08-13: 1 [IU] via SUBCUTANEOUS

## 2022-08-05 MED ORDER — PENTAFLUOROPROP-TETRAFLUOROETH EX AERO: 1.0000 | INHALATION_SPRAY | CUTANEOUS | Status: DC | PRN

## 2022-08-05 MED ORDER — CAMPHOR-MENTHOL 0.5-0.5 % EX LOTN
TOPICAL_LOTION | CUTANEOUS | Status: DC | PRN
  Filled 2022-08-05: qty 222

## 2022-08-05 MED ORDER — ALBUTEROL SULFATE (2.5 MG/3ML) 0.083% IN NEBU
10.0000 mg | INHALATION_SOLUTION | Freq: Once | RESPIRATORY_TRACT | Status: AC
  Administered 2022-08-05: 10 mg via RESPIRATORY_TRACT
  Filled 2022-08-05: qty 12

## 2022-08-05 MED ORDER — HEPARIN SODIUM (PORCINE) 5000 UNIT/ML IJ SOLN
5000.0000 [IU] | Freq: Two times a day (BID) | INTRAMUSCULAR | Status: DC
  Administered 2022-08-05 – 2022-08-13 (×12): 5000 [IU] via SUBCUTANEOUS
  Filled 2022-08-05 (×11): qty 1

## 2022-08-05 MED ORDER — CLOPIDOGREL BISULFATE 75 MG PO TABS
75.0000 mg | ORAL_TABLET | Freq: Every day | ORAL | Status: DC
  Administered 2022-08-05 – 2022-08-13 (×8): 75 mg via ORAL
  Filled 2022-08-05 (×8): qty 1

## 2022-08-05 MED ORDER — PANTOPRAZOLE SODIUM 40 MG PO TBEC
40.0000 mg | DELAYED_RELEASE_TABLET | Freq: Every day | ORAL | Status: DC
  Administered 2022-08-05 – 2022-08-13 (×7): 40 mg via ORAL
  Filled 2022-08-05 (×7): qty 1

## 2022-08-05 MED ORDER — DEXTROSE 50 % IV SOLN
1.0000 | Freq: Once | INTRAVENOUS | Status: AC
  Administered 2022-08-05: 50 mL via INTRAVENOUS
  Filled 2022-08-05: qty 50

## 2022-08-05 MED ORDER — LIDOCAINE-PRILOCAINE 2.5-2.5 % EX CREA: 1.0000 | TOPICAL_CREAM | CUTANEOUS | Status: DC | PRN

## 2022-08-05 MED ORDER — INSULIN ASPART 100 UNIT/ML IV SOLN
5.0000 [IU] | Freq: Once | INTRAVENOUS | Status: AC
  Administered 2022-08-05: 5 [IU] via INTRAVENOUS

## 2022-08-05 MED ORDER — BISMUTH SUBSALICYLATE 262 MG/15ML PO SUSP: 30.0000 mL | Freq: Four times a day (QID) | ORAL | Status: DC | PRN

## 2022-08-05 MED ORDER — LINACLOTIDE 72 MCG PO CAPS
72.0000 ug | ORAL_CAPSULE | Freq: Every morning | ORAL | Status: DC
  Administered 2022-08-06 – 2022-08-13 (×4): 72 ug via ORAL
  Filled 2022-08-05 (×10): qty 1

## 2022-08-05 MED ORDER — HEPARIN SODIUM (PORCINE) 1000 UNIT/ML DIALYSIS
1000.0000 [IU] | INTRAMUSCULAR | Status: DC | PRN
  Administered 2022-08-05: 1000 [IU]
  Filled 2022-08-05 (×2): qty 1

## 2022-08-05 MED ORDER — SODIUM CHLORIDE 0.9 % IV BOLUS
250.0000 mL | Freq: Once | INTRAVENOUS | Status: AC
  Administered 2022-08-05: 250 mL via INTRAVENOUS

## 2022-08-05 MED ORDER — SODIUM CHLORIDE 0.9 % IV BOLUS
500.0000 mL | Freq: Once | INTRAVENOUS | Status: AC
  Administered 2022-08-05: 500 mL via INTRAVENOUS

## 2022-08-05 MED ORDER — SUCROFERRIC OXYHYDROXIDE 500 MG PO CHEW
1000.0000 mg | CHEWABLE_TABLET | Freq: Two times a day (BID) | ORAL | Status: DC
  Administered 2022-08-06 – 2022-08-13 (×10): 1000 mg via ORAL
  Filled 2022-08-05 (×12): qty 2

## 2022-08-05 MED ORDER — ATORVASTATIN CALCIUM 80 MG PO TABS: 80.0000 mg | ORAL_TABLET | Freq: Every day | ORAL | Status: DC

## 2022-08-05 MED ORDER — ALPRAZOLAM 0.25 MG PO TABS
0.2500 mg | ORAL_TABLET | Freq: Every day | ORAL | Status: DC
  Administered 2022-08-05 – 2022-08-13 (×7): 0.25 mg via ORAL
  Filled 2022-08-05 (×7): qty 1

## 2022-08-05 MED ORDER — CHLORHEXIDINE GLUCONATE CLOTH 2 % EX PADS
6.0000 | MEDICATED_PAD | Freq: Every day | CUTANEOUS | Status: DC
  Administered 2022-08-06 – 2022-08-12 (×5): 6 via TOPICAL

## 2022-08-05 MED ORDER — GABAPENTIN 100 MG PO CAPS
100.0000 mg | ORAL_CAPSULE | Freq: Three times a day (TID) | ORAL | Status: DC
  Administered 2022-08-05 – 2022-08-13 (×19): 100 mg via ORAL
  Filled 2022-08-05 (×20): qty 1

## 2022-08-05 MED ORDER — LIDOCAINE HCL (PF) 1 % IJ SOLN: 5.0000 mL | INTRAMUSCULAR | Status: DC | PRN

## 2022-08-05 MED ORDER — SODIUM ZIRCONIUM CYCLOSILICATE 10 G PO PACK
10.0000 g | PACK | Freq: Once | ORAL | Status: AC
  Administered 2022-08-05: 10 g via ORAL
  Filled 2022-08-05: qty 1

## 2022-08-05 MED ORDER — ANTICOAGULANT SODIUM CITRATE 4% (200MG/5ML) IV SOLN: 5.0000 mL | Status: DC | PRN

## 2022-08-05 MED ORDER — CALCIUM GLUCONATE 10 % IV SOLN
1.0000 g | Freq: Once | INTRAVENOUS | Status: AC
  Administered 2022-08-05: 1 g via INTRAVENOUS
  Filled 2022-08-05: qty 10

## 2022-08-05 MED ORDER — SODIUM BICARBONATE 650 MG PO TABS
650.0000 mg | ORAL_TABLET | Freq: Two times a day (BID) | ORAL | Status: DC
  Administered 2022-08-05 – 2022-08-13 (×13): 650 mg via ORAL
  Filled 2022-08-05 (×14): qty 1

## 2022-08-05 MED ORDER — ALTEPLASE 2 MG IJ SOLR: 2.0000 mg | Freq: Once | INTRAMUSCULAR | Status: DC | PRN

## 2022-08-05 MED ORDER — ASPIRIN 81 MG PO TBEC
81.0000 mg | DELAYED_RELEASE_TABLET | Freq: Every day | ORAL | Status: DC
  Administered 2022-08-05 – 2022-08-13 (×6): 81 mg via ORAL
  Filled 2022-08-05 (×6): qty 1

## 2022-08-05 MED ORDER — SODIUM BICARBONATE 8.4 % IV SOLN
50.0000 meq | Freq: Once | INTRAVENOUS | Status: AC
  Administered 2022-08-05: 50 meq via INTRAVENOUS
  Filled 2022-08-05: qty 50

## 2022-08-05 MED ORDER — LINAGLIPTIN 5 MG PO TABS
5.0000 mg | ORAL_TABLET | Freq: Every day | ORAL | Status: DC
  Administered 2022-08-05 – 2022-08-13 (×7): 5 mg via ORAL
  Filled 2022-08-05 (×7): qty 1

## 2022-08-05 MED ORDER — OXYCODONE HCL 5 MG PO TABS
5.0000 mg | ORAL_TABLET | Freq: Four times a day (QID) | ORAL | Status: DC | PRN
  Administered 2022-08-06 (×3): 5 mg via ORAL
  Filled 2022-08-05 (×3): qty 1

## 2022-08-05 MED ORDER — ACETAMINOPHEN 500 MG PO TABS
1000.0000 mg | ORAL_TABLET | ORAL | Status: DC
  Filled 2022-08-05: qty 2

## 2022-08-05 MED ORDER — HEPARIN SODIUM (PORCINE) 1000 UNIT/ML DIALYSIS
8000.0000 [IU] | Freq: Once | INTRAMUSCULAR | Status: DC
  Filled 2022-08-05 (×2): qty 8

## 2022-08-05 MED ORDER — HEPARIN SODIUM (PORCINE) 1000 UNIT/ML DIALYSIS
8000.0000 [IU] | Freq: Once | INTRAMUSCULAR | Status: DC
  Filled 2022-08-05: qty 8

## 2022-08-05 NOTE — Progress Notes (Addendum)
Case with on-call vascular surgery Dr. Virl Cagey and Dr. Sharol Given from orthopedic surgery, initial impression from both service impression is left leg wounds are not possible and both recommended left AKA, potentially scheduled for Wednesday as per Dr. Sharol Given.  Change observation status to full admission to telemetry.

## 2022-08-05 NOTE — ED Provider Triage Note (Signed)
Emergency Medicine Provider Triage Evaluation Note  Edward [name] , a 51 y.o. male  was evaluated in triage.  History of end-stage renal disease, chronic wounds -- presents with 2 days of nausea and vomiting.  No focal pain.  No fevers reported.  Bradycardic on arrival.  Review of Systems  Positive: Nausea, vomiting Negative: Fever  Physical Exam  BP (!) 89/54   Pulse (!) 44   Temp (!) 97.5 F (36.4 C)   Resp 20   Ht '6\' 1"'$  (1.854 m)   Wt 90.3 kg   SpO2 96%   BMI 26.26 kg/m  Gen:   Awake, no distress   Resp:  Normal effort  MSK:   Moves extremities without difficulty, patient with bandage chronic wound including left foot Other:  Bradycardic heart rate 40-45.  Medical Decision Making  Medically screening exam initiated at 11:36 AM.  Appropriate orders placed.  [name] was informed that the remainder of the evaluation will be completed by another provider, this initial triage assessment does not replace that evaluation, and the importance of remaining in the ED until their evaluation is complete.     Carlisle Cater, PA-C 08/05/22 1139

## 2022-08-05 NOTE — ED Triage Notes (Signed)
Pt reports nausea and dizziness since he was at dialysis on Saturday. T/TH/Sat schedule, no missed sessions. Pt currently getting treated for wounds on his left foot. Pt hypotensive in triage.

## 2022-08-05 NOTE — H&P (Signed)
History and Physical    Edward [name] EVO:[id number] DOB: 11-Nov-1970 DOA: 08/05/2022  PCP: [name], MD (Confirm with patient/family/NH records and if not entered, this has to be entered at [facility] point of entry) Patient coming from: Home  I have personally briefly reviewed patient's old medical records in [place]  Chief Complaint: Nauseous vomiting, feeling dizzy  HPI: Edward [name] is a 51 y.o. male with medical history significant of ESRD on HD TTS, PVS status post multiple amputations including right BKA, and multiple fingers amputation, and left transmetatarsal amputation, with chronic left leg and foot and left shin wound, IDDM, combined chronic HFpEF HF R EF with LVEF 45%, chronic orthostatic hypotension, recurrent hyperkalemia, presented with worsening of nauseous vomiting and dizziness.  Last dialysis was Saturday however cut short due to patient developed symptoms of dizziness.  Estimated only 2 hours dialysis on Saturday.  Yesterday, patient started to feel strong feeling nauseous and vomiting clear stomach content, 4-5 times yesterday and through the night, episode of chills but no fever.  No diarrhea.  Denies any abdominal pain.  This morning, patient started to feel strong feeling of lightheadedness and blurry vision denies any chest pain shortness of breath muscle weakness.  He has chronic left lower extremity wounds for which she been following with wound center and last visit on Wednesday, he was told that left heel "does not look good" and he was referred to see vascular surgery this coming Wednesday.  Family who does dressing changes at home reported that no significant foul smell, and he has severe peripheral neuropathy and reports no pain at all.  ED Course: Patient was found bradycardia with hypotensive, responded to a small IV bolus of 100 mL normal saline and blood pressure improved to SBP 110s heart rate also improved to 90s, afebrile, down hypoxic.  Blood work  showed severe hyperkalemia K7.0, significant high anion gap metabolic acidosis bicarb 12, creatinine 8.8, BUN 71.  EKG initially showed sinus bradycardia with tented T wave and patient was given hyperkalemia cocktail including Lokelma, IV fluid, D50 and insulin.  Right now, patient is getting emergent dialysis at HD unit.  Review of Systems: As per HPI otherwise 14 point review of systems negative.    Past Medical History:  Diagnosis Date   Allergy    Anemia    getting Venofer with dialysis   Anxiety    self reported, sometimes-situational anxiety   Arthritis    bilateral hands/LEFT hip   CHF (congestive heart failure) (HCC)    Chronic kidney disease    t th s dialysis- [place] ---ESRD   Coronary artery disease    Depression    self reported, sometimes-situational depression   Diabetes mellitus    type II- on meds   Dyspnea    sometimes- fluid   GERD (gastroesophageal reflux disease)    on meds   History of blood transfusion    Hypertension    no longer on medication since starting on Hemodialysis   Myocardial infarction ([place]) 2019   Peripheral vascular disease ([place])    Sleep apnea    does not use CPAP    Past Surgical History:  Procedure Laterality Date   A/V FISTULAGRAM Left 03/06/2020   Procedure: A/V FISTULAGRAM;  Surgeon: Waynetta Sandy, MD;  Location: [place] [facility];  Service: Cardiovascular;  Laterality: Left;   ABDOMINAL AORTOGRAM N/A 01/07/2022   Procedure: ABDOMINAL AORTOGRAM;  Surgeon: Waynetta Sandy, MD;  Location: [place] [facility];  Service: Cardiovascular;  Laterality: N/A;   ABDOMINAL AORTOGRAM W/LOWER EXTREMITY N/A 04/09/2021   Procedure: ABDOMINAL AORTOGRAM W/LOWER EXTREMITY;  Surgeon: Waynetta Sandy, MD;  Location: [place] [facility];  Service: Cardiovascular;  Laterality: N/A;   AMPUTATION Left 05/10/2016   Procedure: Left Transmetatarsal Amputation;  Surgeon: [name], MD;  Location: [place];  Service: Orthopedics;   Laterality: Left;   AMPUTATION Right 05/16/2018   Procedure: PARTIAL RAY AMPUTATION FIFTH TOE RIGHT FOOT;  Surgeon: [name], DPM;  Location: [place];  Service: Podiatry;  Laterality: Right;   AMPUTATION Right 06/17/2018   Procedure: AMPUTATION 4th RAY Right Foot;  Surgeon: Trula Slade, DPM;  Location: [place];  Service: Podiatry;  Laterality: Right;   AMPUTATION Right 06/19/2018   Procedure: RIGHT BELOW KNEE AMPUTATION;  Surgeon: [name], MD;  Location: [place];  Service: Orthopedics;  Laterality: Right;   AMPUTATION Left 10/05/2021   Procedure: LEFT RING FINGER AMPUTATION;  Surgeon: Daryll Brod, MD;  Location: [place];  Service: Orthopedics;  Laterality: Left;  45 MIN   AMPUTATION Left 11/15/2021   Procedure: LEFT INDEX FINGER AMPUTATION;  Surgeon: Leanora Cover, MD;  Location: [place];  Service: Orthopedics;  Laterality: Left;  60 MIN   AMPUTATION Right 01/08/2022   Procedure: AMPUTATION ABOVE KNEE;  Surgeon: Waynetta Sandy, MD;  Location: [place];  Service: Vascular;  Laterality: Right;   AMPUTATION Bilateral 01/12/2022   Procedure: LEFT INDEX FINGER AMPUTATION, RIGHT INDEX, LONG AND SMALL DIGITS AMPUTATION;  Surgeon: Leanora Cover, MD;  Location: [place];  Service: Orthopedics;  Laterality: Bilateral;   AMPUTATION Right 04/01/2022   Procedure: RIGHT INDEX FINGER AMPUTATION AT METACARPAL PHALANGEAL JOINT, INCISION AND DRAINAGE RIGHT HAND;  Surgeon: Leanora Cover, MD;  Location: [place];  Service: Orthopedics;  Laterality: Right;  90 MIN   AMPUTATION Right 05/23/2022   Procedure: RIGHT INDEX FINGER RAY AMPUTATION; RIGHT LONG, RING, AND SMALL FINGER AMPUTATIONS;  Surgeon: Leanora Cover, MD;  Location: [place];  Service: Orthopedics;  Laterality: Right;  90 MIN   AORTIC ARCH ANGIOGRAPHY N/A 01/07/2022   Procedure: AORTIC ARCH ANGIOGRAPHY;  Surgeon: Waynetta Sandy, MD;  Location: [place] [facility];  Service: Cardiovascular;  Laterality: N/A;   APPLICATION OF WOUND VAC Right  06/19/2018   Procedure: APPLICATION OF WOUND VAC;  Surgeon: [name], MD;  Location: [place];  Service: Orthopedics;  Laterality: Right;   AV FISTULA PLACEMENT Left 11/25/2019   Procedure: LEFT ARM Brachiocephalic  ARTERIOVENOUS (AV) FISTULA CREATION;  Surgeon: Rosetta Posner, MD;  Location: [place];  Service: Vascular;  Laterality: Left;   [place] Left 07/19/2020   Procedure: LEFT SECOND STAGE [place];  Surgeon: Waynetta Sandy, MD;  Location: [place];  Service: Vascular;  Laterality: Left;   CIRCUMCISION  09/02/2011   Procedure: CIRCUMCISION ADULT;  Surgeon: Molli Hazard, MD;  Location: [place];  Service: Urology;  Laterality: N/A;   CORONARY STENT INTERVENTION N/A 07/29/2018   Procedure: CORONARY STENT INTERVENTION;  Surgeon: Leonie Man, MD;  Location: [place] [facility];  Service: Cardiovascular;  Laterality: N/A;   DEBRIDEMENT AND CLOSURE WOUND Left 11/15/2021   Procedure: LEFT HAND WOUND CLOSURE;  Surgeon: Leanora Cover, MD;  Location: [place];  Service: Orthopedics;  Laterality: Left;   I & D EXTREMITY  09/02/2011   Procedure: IRRIGATION AND DEBRIDEMENT EXTREMITY;  Surgeon: Theodoro Kos, DO;  Location: [place];  Service: Plastics;  Laterality: N/A;   INCISION AND DRAINAGE OF WOUND  08/12/2011   Procedure: IRRIGATION AND DEBRIDEMENT WOUND;  Surgeon: Zenovia Jarred, MD;  Location: [place];  Service: General;;  perineum   INSERTION OF DIALYSIS CATHETER N/A 11/25/2019   Procedure: INSERTION OF Righ Internal Jugular DIALYSIS CATHETER;  Surgeon: Rosetta Posner, MD;  Location: [place];  Service: Vascular;  Laterality: N/A;   [place]  08/12/2011   Procedure: IRRIGATION AND DEBRIDEMENT ABSCESS;  Surgeon: Molli Hazard, MD;  Location: [place];  Service: Urology;  Laterality: N/A;  irrigation and debridment perineum     IRRIGATION AND DEBRIDEMENT ABSCESS  08/14/2011    Procedure: MINOR INCISION AND DRAINAGE OF ABSCESS;  Surgeon: Molli Hazard, MD;  Location: [place];  Service: Urology;  Laterality: N/A;  Perineal Wound Debridement;Placement Bilateral Testicular Thigh Pouches   LEFT HEART CATH AND CORONARY ANGIOGRAPHY N/A 07/29/2018   Procedure: LEFT HEART CATH AND CORONARY ANGIOGRAPHY;  Surgeon: Leonie Man, MD;  Location: [place] [facility];  Service: Cardiovascular;  Laterality: N/A;   LOWER EXTREMITY ANGIOGRAPHY Bilateral 01/07/2022   Procedure: Lower Extremity Angiography;  Surgeon: Waynetta Sandy, MD;  Location: [place] [facility];  Service: Cardiovascular;  Laterality: Bilateral;   PERIPHERAL VASCULAR ATHERECTOMY Left 04/09/2021   Procedure: PERIPHERAL VASCULAR ATHERECTOMY;  Surgeon: Waynetta Sandy, MD;  Location: [place] [facility];  Service: Cardiovascular;  Laterality: Left;  TP trunk and peroneal arteries   PERIPHERAL VASCULAR BALLOON ANGIOPLASTY Left 04/09/2021   Procedure: PERIPHERAL VASCULAR BALLOON ANGIOPLASTY;  Surgeon: Waynetta Sandy, MD;  Location: [place] [facility];  Service: Cardiovascular;  Laterality: Left;  Popliteal   REVISON OF ARTERIOVENOUS FISTULA Left 05/24/2020   Procedure: LEFT ARM ARTERIOVENOUS FISTULA  CONVERSION TO BASILIC VEIN FISTULA;  Surgeon: Waynetta Sandy, MD;  Location: [place];  Service: Vascular;  Laterality: Left;   TOTAL HIP ARTHROPLASTY Left 10/11/2016   Procedure: LEFT TOTAL HIP ARTHROPLASTY ANTERIOR APPROACH;  Surgeon: Mcarthur Rossetti, MD;  Location: [facility];  Service: Orthopedics;  Laterality: Left;   UPPER EXTREMITY ANGIOGRAPHY Bilateral 01/07/2022   Procedure: Upper Extremity Angiography;  Surgeon: Waynetta Sandy, MD;  Location: [place] [facility];  Service: Cardiovascular;  Laterality: Bilateral;     reports that he quit smoking about 2 years ago. His smoking use included cigarettes. He has a 6.00 pack-year smoking history. He has never used  smokeless tobacco. He reports that he does not currently use alcohol. He reports that he does not use drugs.  Allergies  Allergen Reactions   Crestor [Rosuvastatin] Rash    Eye redness   Dilaudid [Hydromorphone] Itching   Robaxin [Methocarbamol] Other (See Comments)    Pt states medication made his arm pain worse.   Benadryl [Diphenhydramine] Rash    Family History  Problem Relation Age of Onset   Diabetes Other    Pancreatic cancer Mother 80   Colon cancer Neg Hx    Colon polyps Neg Hx    Esophageal cancer Neg Hx    Rectal cancer Neg Hx    Stomach cancer Neg Hx      Prior to Admission medications   Medication Sig Start Date End Date Taking? Authorizing Provider  acetaminophen (TYLENOL) 500 MG tablet Take 1,000 mg by mouth Every Tuesday,Thursday,and Saturday with dialysis.    [provider]  ALPRAZolam Duanne Moron) 0.25 MG tablet Take 0.25 mg by mouth daily. 04/29/22   [provider]  aspirin EC 81 MG  tablet Take 81 mg by mouth daily. Swallow whole.    [provider]  atorvastatin (LIPITOR) 80 MG tablet TAKE ONE TABLET BY MOUTH ONCE DAILY 08/01/22   Lelon Perla, MD  bismuth subsalicylate (PEPTO BISMOL) 262 MG/15ML suspension Take 30 mLs by mouth every 6 (six) hours as needed for indigestion.    [provider]  clopidogrel (PLAVIX) 75 MG tablet TAKE ONE TABLET BY MOUTH ONCE DAILY Patient taking differently: Take 75 mg by mouth daily. 02/01/22   Waynetta Sandy, MD  ezetimibe (ZETIA) 10 MG tablet Take 1 tablet (10 mg total) by mouth daily. Patient not taking: Reported on 05/11/2022 01/17/22   Edwin Dada, MD  gabapentin (NEURONTIN) 100 MG capsule Take 100 mg by mouth 3 (three) times daily. 12/03/21   [provider]  HUMALOG MIX 75/25 KWIKPEN (75-25) 100 UNIT/ML KwikPen Inject 30 Units into the skin daily. 05/04/22   [provider]  insulin aspart protamine - aspart (NOVOLOG MIX 70/30 FLEXPEN) (70-30) 100  UNIT/ML FlexPen Inject 0.3 mLs (30 Units total) into the skin 2 (two) times daily with a meal. Patient taking differently: Inject 0-30 Units into the skin 2 (two) times daily as needed. 09/29/20   Lavina Hamman, MD  Insulin Pen Needle 31G X 5 MM MISC Use daily to inject insulin as instructed 07/01/18   Angiulli, Lavon Paganini, PA-C  linaclotide [facility]) 72 MCG capsule Take 1 capsule (72 mcg total) by mouth daily before breakfast. Patient taking differently: Take 72 mcg by mouth in the morning. 12/13/20   Armbruster, Carlota Raspberry, MD  oxyCODONE (ROXICODONE) 5 MG immediate release tablet 1-2 tabs PO q6 hours prn pain 05/23/22   Leanora Cover, MD  pantoprazole (PROTONIX) 40 MG tablet Take 1 tablet (40 mg total) by mouth daily. 08/01/22   Willia Craze, NP  sulfamethoxazole-trimethoprim (BACTRIM DS) 800-160 MG tablet Take 1 tablet by mouth 2 (two) times daily. 05/17/22   [provider]  VELPHORO 500 MG chewable tablet Chew 1,000 mg by mouth 2 (two) times daily. 06/14/20   [provider]    Physical Exam: Vitals:   08/05/22 1135 08/05/22 1135  BP:  (!) 89/54  Pulse:  (!) 44  Resp:  20  Temp:  (!) 97.5 F (36.4 C)  SpO2:  96%  Weight: 90.3 kg   Height: '6\' 1"'$  (1.854 m)     Constitutional: NAD, calm, comfortable Vitals:   08/05/22 1135 08/05/22 1135  BP:  (!) 89/54  Pulse:  (!) 44  Resp:  20  Temp:  (!) 97.5 F (36.4 C)  SpO2:  96%  Weight: 90.3 kg   Height: '6\' 1"'$  (1.854 m)    Eyes: PERRL, lids and conjunctivae normal ENMT: Mucous membranes are moist. Posterior pharynx clear of any exudate or lesions.Normal dentition.  Neck: normal, supple, no masses, no thyromegaly Respiratory: clear to auscultation bilaterally, no wheezing, no crackles. Normal respiratory effort. No accessory muscle use.  Cardiovascular: Regular rate and rhythm, no murmurs / rubs / gallops. No extremity edema. 2+ pedal pulses. No carotid bruits.  Abdomen: no tenderness, no masses palpated. No  hepatosplenomegaly. Bowel sounds positive.  Musculoskeletal: no clubbing / cyanosis. No joint deformity upper and lower extremities. Good ROM, no contractures. Normal muscle tone.  Skin: Multiple large area of ulcers located on left shin and heel area, the wound on the left heel area appears to be necrotic probably from chronic blood supply issue. There are some thin light yellowish discharge, of  no foul smell and skin not warmer to touch compared to the left side Neurologic: CN 2-12 grossly intact. Sensation intact, DTR normal. Strength 5/5 in all 4.  Psychiatric: Normal judgment and insight. Alert and oriented x 3. Normal mood.        Labs on Admission: I have personally reviewed following labs and imaging studies  CBC: Recent Labs  Lab 08/05/22 1240 08/05/22 1248  WBC 12.0*  --   NEUTROABS 9.7*  --   HGB 11.1* 12.9*  HCT 36.3* 38.0*  MCV 98.1  --   PLT 270  --    Basic Metabolic Panel: Recent Labs  Lab 08/05/22 1240 08/05/22 1248  NA 139 135  K 7.0* 7.0*  CL 92* 100  CO2 12*  --   GLUCOSE 105* 102*  BUN 71* 84*  CREATININE 8.84* 8.90*  CALCIUM 10.2  --   MG 2.8*  --    GFR: Estimated Creatinine Clearance: 11 mL/min (A) (by C-G formula based on SCr of 8.9 mg/dL (H)). Liver Function Tests: Recent Labs  Lab 08/05/22 1240  AST 140*  ALT 74*  ALKPHOS 166*  BILITOT 1.0  PROT 8.3*  ALBUMIN 3.0*   Recent Labs  Lab 08/05/22 1240  LIPASE 24   No results for input(s): "AMMONIA" in the last 168 hours. Coagulation Profile: No results for input(s): "INR", "PROTIME" in the last 168 hours. Cardiac Enzymes: No results for input(s): "CKTOTAL", "CKMB", "CKMBINDEX", "TROPONINI" in the last 168 hours. BNP (last 3 results) No results for input(s): "PROBNP" in the last 8760 hours. HbA1C: No results for input(s): "HGBA1C" in the last 72 hours. CBG: No results for input(s): "GLUCAP" in the last 168 hours. Lipid Profile: No results for input(s): "CHOL", "HDL",  "LDLCALC", "TRIG", "CHOLHDL", "LDLDIRECT" in the last 72 hours. Thyroid Function Tests: No results for input(s): "TSH", "T4TOTAL", "FREET4", "T3FREE", "THYROIDAB" in the last 72 hours. Anemia Panel: No results for input(s): "VITAMINB12", "FOLATE", "FERRITIN", "TIBC", "IRON", "RETICCTPCT" in the last 72 hours. Urine analysis:    Component Value Date/Time   COLORURINE YELLOW 09/06/2021 0748   APPEARANCEUR CLEAR 09/06/2021 0748   LABSPEC >1.030 (H) 09/06/2021 0748   PHURINE 5.5 09/06/2021 0748   GLUCOSEU 100 (A) 09/06/2021 0748   HGBUR MODERATE (A) 09/06/2021 0748   BILIRUBINUR NEGATIVE 09/06/2021 0748   KETONESUR NEGATIVE 09/06/2021 0748   PROTEINUR >300 (A) 09/06/2021 0748   UROBILINOGEN 0.2 05/04/2014 1002   NITRITE NEGATIVE 09/06/2021 0748   LEUKOCYTESUR TRACE (A) 09/06/2021 0748    Radiological Exams on Admission: DG Chest Portable 1 View  Result Date: 08/05/2022 CLINICAL DATA:  Weakness.  End-stage renal disease. EXAM: PORTABLE CHEST 1 VIEW COMPARISON:  05/10/2022 FINDINGS: The cardio pericardial silhouette is enlarged. Right IJ central line tip overlies the right atrium. The lungs are clear without focal pneumonia, edema, pneumothorax or pleural effusion. The visualized bony structures of the thorax are unremarkable. Telemetry leads overlie the chest. IMPRESSION: Enlargement of the cardiopericardial silhouette without acute cardiopulmonary findings. Electronically Signed   By: Misty Stanley M.D.   On: 08/05/2022 13:02    EKG: Independently reviewed.  Sinus bradycardia, tented T waves on multiple leads  Assessment/Plan Active Problems:   * No active hospital problems. *  (please populate well all problems here in Problem List. (For example, if patient is on BP meds at home and you resume or decide to hold them, it is a problem that needs to be her. Same for CAD, COPD, HLD and so on)  Hyperkalemia -Recurrent  etiology unclear, might be multifactorial from worsening of ESRD,  severe acidosis, and question of ongoing tissue/muscle damage from blood supply issue from left lower extremity -Ordered CK, LDH, lactic acid, suspect ongoing deep tissue from his baseline PVD.  Review his most recent angiograph done in May 2023 showed: Femoral and SFA are patent, 50% stenosis of the left popliteal artery, unsure this much of stenosis leads to left heal necrosis/dry gangrene like changes.  Placed a call to vascular surgeon to see whether additional inpatient vascular surgery workup needed. -As the gangrene like changes on left heel appear to be chronic, no heparin drip indicated at this point. -Continue Plavix, statin on hold due to elevated liver enzymes. -Patient is getting emergent dialysis, recheck K level tomorrow.  Acute metabolic acidosis, combined non-anion gap and increased anion gap metabolic acidosis -Again suspect left lower extremity/foot/heel necrosis, checking lactic acid LDH -Start patient on bicarb p.o. -Dialysis underway  PVD with multiple left lower extremity unhealing ulcers and left heel dry gangrene -Above, paged vascular surgery.  SIRS -Clinically, more likely volume contraction from repeated nauseous vomiting and volume loss/GI loss. -He does have a chronic wound on left lower extremity, which according to patient and his family there is no significant increase of discharge or change of smell and he does not have fever and leukocytosis is mild.  Will check procalcitonin, hold off antibiotics.  Acute transaminitis -Suspect this is secondary to send shock liver from low blood pressure, no RUQ symptoms and no acute bilirubinemia, biliary obstruction unlikely -Hold off starting -Recheck LFT tomorrow  CAD with chronic ischemic cardiomyopathy and chronic combined HF R EF and HFpEF -Volume contracted -Getting HD today to treat hyperkalemia and acidosis, reevaluate volume status tomorrow.  IDDM -With insulin resistance, start sliding scale, add  Januvia  DVT prophylaxis: Heparin subcu Code Status: Full code Family Communication: None at bedside Disposition Plan: Discharge in 24 hours, if no inpatient vascular surgery procedures needed. Consults called: Paged vascular surgeon and left message to office Admission status: Tele observation   Lequita Halt MD [facility] Pager [phone]  08/05/2022, 2:19 PM

## 2022-08-05 NOTE — ED Provider Notes (Signed)
Emergency Department Provider Note   I have reviewed the triage vital signs and the nursing notes.   HISTORY  Chief Complaint Nausea and Dizziness   HPI Edward [name] is a 51 y.o. male CHF, ESRD, DM, right BKA and s/p right [place] Corter, ring, and small finger amputations with Dr. Fredna Dow on 10/5 presents to the emergency department with nausea and lightheadedness.  His last dialysis session was Saturday and he is not due again until tomorrow.  He states has been compliant with his medications but began to feel lightheaded along with nausea.  He had 1 episode of vomiting.  He is not experiencing chest pain, shortness of breath, abdominal pain at this time.  Does not recall fevers or chills.  Past Medical History:  Diagnosis Date   Allergy    Anemia    getting Venofer with dialysis   Anxiety    self reported, sometimes-situational anxiety   Arthritis    bilateral hands/LEFT hip   CHF (congestive heart failure) (HCC)    Chronic kidney disease    t th s dialysis- [place] ---ESRD   Coronary artery disease    Depression    self reported, sometimes-situational depression   Diabetes mellitus    type II- on meds   Dyspnea    sometimes- fluid   GERD (gastroesophageal reflux disease)    on meds   History of blood transfusion    Hypertension    no longer on medication since starting on Hemodialysis   Myocardial infarction [facility]) 2019   Peripheral vascular disease ([place])    Sleep apnea    does not use CPAP    Review of Systems  Constitutional: No fever/chills. Positive generalized weakness.  Eyes: No visual changes. ENT: No sore throat. Cardiovascular: Denies chest pain. Respiratory: Denies shortness of breath. Gastrointestinal: No abdominal pain. Positive nausea and vomiting.  No diarrhea.  No constipation. Genitourinary: Negative for dysuria. Musculoskeletal: Negative for back pain. Skin: Negative for rash. Neurological: Negative for headaches, focal weakness or  numbness.  ____________________________________________   PHYSICAL EXAM:  VITAL SIGNS: ED Triage Vitals  Enc Vitals Group     BP 08/05/22 1135 (!) 89/54     Pulse Rate 08/05/22 1135 (!) 44     Resp 08/05/22 1135 20     Temp 08/05/22 1135 (!) 97.5 F (36.4 C)     Temp src --      SpO2 08/05/22 1135 96 %     Weight 08/05/22 1135 199 lb 1.2 oz (90.3 kg)     Height 08/05/22 1135 '6\' 1"'$  (1.854 m)   Constitutional: Alert and oriented but overall appears unwell and fatigued.  Eyes: Conjunctivae are normal.  Head: Atraumatic. Nose: No congestion/rhinnorhea. Mouth/Throat: Mucous membranes are dry.  Neck: No stridor.   Cardiovascular: Bradycardia. Good peripheral circulation. Grossly normal heart sounds.   Respiratory: Normal respiratory effort.  No retractions. Lungs CTAB. Gastrointestinal: Soft and nontender. No distention.  Musculoskeletal: Right AKA with left leg/foot in una boot. S/p amputation on fingers 3-5 on the right hand.  Neurologic:  Normal speech and language. No gross focal neurologic deficits are appreciated.  Skin:  Skin is warm, dry and intact. No rash noted.  ____________________________________________   LABS (all labs ordered are listed, but only abnormal results are displayed)  Labs Reviewed  CBC WITH DIFFERENTIAL/PLATELET - Abnormal; Notable for the following components:      Result Value   WBC 12.0 (*)    RBC 3.70 (*)    Hemoglobin  11.1 (*)    HCT 36.3 (*)    RDW 17.2 (*)    nRBC 0.5 (*)    Neutro Abs 9.7 (*)    Abs Immature Granulocytes 0.22 (*)    All other components within normal limits  COMPREHENSIVE METABOLIC PANEL - Abnormal; Notable for the following components:   Potassium 7.0 (*)    Chloride 92 (*)    CO2 12 (*)    Glucose, Bld 105 (*)    BUN 71 (*)    Creatinine, Ser 8.84 (*)    Total Protein 8.3 (*)    Albumin 3.0 (*)    AST 140 (*)    ALT 74 (*)    Alkaline Phosphatase 166 (*)    GFR, Estimated 7 (*)    Anion gap 35 (*)    All  other components within normal limits  MAGNESIUM - Abnormal; Notable for the following components:   Magnesium 2.8 (*)    All other components within normal limits  BLOOD GAS, VENOUS - Abnormal; Notable for the following components:   pH, Ven 7.48 (*)    pCO2, Ven 32 (*)    pO2, Ven 50 (*)    All other components within normal limits  LACTIC ACID, PLASMA - Abnormal; Notable for the following components:   Lactic Acid, Venous 7.1 (*)    All other components within normal limits  LACTATE DEHYDROGENASE - Abnormal; Notable for the following components:   LDH 499 (*)    All other components within normal limits  BASIC METABOLIC PANEL - Abnormal; Notable for the following components:   Sodium 133 (*)    Chloride 94 (*)    Glucose, Bld 148 (*)    BUN 36 (*)    Creatinine, Ser 4.83 (*)    Calcium 8.6 (*)    GFR, Estimated 14 (*)    Anion gap 17 (*)    All other components within normal limits  HEPATIC FUNCTION PANEL - Abnormal; Notable for the following components:   Albumin 2.7 (*)    AST 333 (*)    ALT 155 (*)    Alkaline Phosphatase 141 (*)    Bilirubin, Direct 0.3 (*)    All other components within normal limits  GLUCOSE, CAPILLARY - Abnormal; Notable for the following components:   Glucose-Capillary 135 (*)    All other components within normal limits  LACTIC ACID, PLASMA - Abnormal; Notable for the following components:   Lactic Acid, Venous 2.8 (*)    All other components within normal limits  GLUCOSE, CAPILLARY - Abnormal; Notable for the following components:   Glucose-Capillary 209 (*)    All other components within normal limits  GLUCOSE, CAPILLARY - Abnormal; Notable for the following components:   Glucose-Capillary 110 (*)    All other components within normal limits  CBC - Abnormal; Notable for the following components:   RBC 3.15 (*)    Hemoglobin 9.5 (*)    HCT 29.4 (*)    RDW 16.9 (*)    nRBC 0.6 (*)    All other components within normal limits  CBC WITH  DIFFERENTIAL/PLATELET - Abnormal; Notable for the following components:   RBC 3.34 (*)    Hemoglobin 9.8 (*)    HCT 31.6 (*)    RDW 17.1 (*)    nRBC 0.5 (*)    Neutro Abs 7.8 (*)    Lymphs Abs 0.6 (*)    Abs Immature Granulocytes 0.08 (*)    All other components within  normal limits  COMPREHENSIVE METABOLIC PANEL - Abnormal; Notable for the following components:   Chloride 96 (*)    Glucose, Bld 157 (*)    BUN 30 (*)    Creatinine, Ser 4.37 (*)    Calcium 8.6 (*)    Albumin 2.4 (*)    AST 451 (*)    ALT 273 (*)    Alkaline Phosphatase 132 (*)    GFR, Estimated 15 (*)    Anion gap 18 (*)    All other components within normal limits  GLUCOSE, CAPILLARY - Abnormal; Notable for the following components:   Glucose-Capillary 181 (*)    All other components within normal limits  GLUCOSE, CAPILLARY - Abnormal; Notable for the following components:   Glucose-Capillary 132 (*)    All other components within normal limits  GLUCOSE, CAPILLARY - Abnormal; Notable for the following components:   Glucose-Capillary 140 (*)    All other components within normal limits  CBC WITH DIFFERENTIAL/PLATELET - Abnormal; Notable for the following components:   RBC 3.03 (*)    Hemoglobin 8.9 (*)    HCT 28.8 (*)    RDW 17.2 (*)    nRBC 0.3 (*)    Neutro Abs 7.9 (*)    Lymphs Abs 0.6 (*)    Abs Immature Granulocytes 0.10 (*)    All other components within normal limits  BASIC METABOLIC PANEL - Abnormal; Notable for the following components:   Chloride 95 (*)    Glucose, Bld 195 (*)    BUN 48 (*)    Creatinine, Ser 6.44 (*)    Calcium 8.4 (*)    GFR, Estimated 10 (*)    Anion gap 16 (*)    All other components within normal limits  GLUCOSE, CAPILLARY - Abnormal; Notable for the following components:   Glucose-Capillary 172 (*)    All other components within normal limits  GLUCOSE, CAPILLARY - Abnormal; Notable for the following components:   Glucose-Capillary 177 (*)    All other components  within normal limits  HEPATIC FUNCTION PANEL - Abnormal; Notable for the following components:   Total Protein 6.4 (*)    Albumin 2.3 (*)    AST 113 (*)    ALT 138 (*)    Bilirubin, Direct 0.3 (*)    All other components within normal limits  CBC WITH DIFFERENTIAL/PLATELET - Abnormal; Notable for the following components:   WBC 11.6 (*)    RBC 2.96 (*)    Hemoglobin 8.9 (*)    HCT 28.1 (*)    RDW 17.6 (*)    nRBC 0.4 (*)    Neutro Abs 9.2 (*)    Monocytes Absolute 1.2 (*)    Abs Immature Granulocytes 0.12 (*)    All other components within normal limits  COMPREHENSIVE METABOLIC PANEL - Abnormal; Notable for the following components:   Chloride 97 (*)    Glucose, Bld 156 (*)    BUN 30 (*)    Creatinine, Ser 4.72 (*)    Calcium 8.7 (*)    Total Protein 6.4 (*)    Albumin 2.5 (*)    AST 48 (*)    ALT 59 (*)    GFR, Estimated 14 (*)    Anion gap 18 (*)    All other components within normal limits  GLUCOSE, CAPILLARY - Abnormal; Notable for the following components:   Glucose-Capillary 160 (*)    All other components within normal limits  GLUCOSE, CAPILLARY - Abnormal; Notable for the following  components:   Glucose-Capillary 158 (*)    All other components within normal limits  PHOSPHORUS - Abnormal; Notable for the following components:   Phosphorus 4.9 (*)    All other components within normal limits  GLUCOSE, CAPILLARY - Abnormal; Notable for the following components:   Glucose-Capillary 184 (*)    All other components within normal limits  CBC WITH DIFFERENTIAL/PLATELET - Abnormal; Notable for the following components:   RBC 3.19 (*)    Hemoglobin 9.6 (*)    HCT 29.9 (*)    RDW 17.7 (*)    nRBC 0.8 (*)    Monocytes Absolute 1.1 (*)    Abs Immature Granulocytes 0.09 (*)    All other components within normal limits  COMPREHENSIVE METABOLIC PANEL - Abnormal; Notable for the following components:   Chloride 95 (*)    CO2 21 (*)    Glucose, Bld 191 (*)    BUN 62  (*)    Creatinine, Ser 6.29 (*)    Calcium 8.7 (*)    Albumin 2.6 (*)    Total Bilirubin 1.5 (*)    GFR, Estimated 10 (*)    Anion gap 19 (*)    All other components within normal limits  GLUCOSE, CAPILLARY - Abnormal; Notable for the following components:   Glucose-Capillary 185 (*)    All other components within normal limits  GLUCOSE, CAPILLARY - Abnormal; Notable for the following components:   Glucose-Capillary 174 (*)    All other components within normal limits  CBC - Abnormal; Notable for the following components:   RBC 2.93 (*)    Hemoglobin 8.7 (*)    HCT 27.4 (*)    RDW 17.9 (*)    nRBC 1.1 (*)    All other components within normal limits  GLUCOSE, CAPILLARY - Abnormal; Notable for the following components:   Glucose-Capillary 147 (*)    All other components within normal limits  GLUCOSE, CAPILLARY - Abnormal; Notable for the following components:   Glucose-Capillary 127 (*)    All other components within normal limits  I-STAT CHEM 8, ED - Abnormal; Notable for the following components:   Potassium 7.0 (*)    BUN 84 (*)    Creatinine, Ser 8.90 (*)    Glucose, Bld 102 (*)    Calcium, Ion 1.03 (*)    TCO2 15 (*)    Hemoglobin 12.9 (*)    HCT 38.0 (*)    All other components within normal limits  RESP PANEL BY RT-PCR (RSV, FLU A&B, COVID)  RVPGX2  CULTURE, BLOOD (ROUTINE X 2)  CULTURE, BLOOD (ROUTINE X 2)  MRSA NEXT GEN BY PCR, NASAL  LIPASE, BLOOD  HEPATITIS B SURFACE ANTIGEN  HEPATITIS B SURFACE ANTIBODY, QUANTITATIVE  CK  PROCALCITONIN  PROCALCITONIN  PROCALCITONIN  HEPATITIS PANEL, ACUTE  SURGICAL PATHOLOGY   ____________________________________________  EKG   EKG Interpretation  Date/Time:  Monday August 05 2022 11:42:40 EST Ventricular Rate:  43 PR Interval:    QRS Duration: 122 QT Interval:  582 QTC Calculation: 491 R Axis:   158 Text Interpretation: Wide QRS rhythm Septal infarct , age undetermined Possible Lateral infarct , age  undetermined Abnormal ECG When compared with ECG of 11-May-2022 08:52, PREVIOUS ECG IS PRESENT Confirmed by Nanda Quinton [phone]) on 08/05/2022 12:45:19 PM       ____________________________________________   PROCEDURES  Procedure(s) performed:   .Critical Care  Performed by: Margette Fast, MD Authorized by: Margette Fast, MD   Critical care provider  statement:    Critical care time (minutes):  45   Critical care time was exclusive of:  Separately billable procedures and treating other patients and teaching time   Critical care was necessary to treat or prevent imminent or life-threatening deterioration of the following conditions:  Endocrine crisis, dehydration and shock   Critical care was time spent personally by me on the following activities:  Development of treatment plan with patient or surrogate, discussions with consultants, evaluation of patient's response to treatment, examination of patient, ordering and review of laboratory studies, ordering and review of radiographic studies, ordering and performing treatments and interventions, pulse oximetry, re-evaluation of patient's condition, review of old charts, blood draw for specimens and obtaining history from patient or surrogate   I assumed direction of critical care for this patient from another provider in my specialty: yes     Care discussed with: admitting provider      ____________________________________________   INITIAL IMPRESSION / [place] / ED COURSE  Pertinent labs & imaging results that were available during my care of the patient were reviewed by me and considered in my medical decision making (see chart for details).   This patient is Presenting for Evaluation of weakness, which does require a range of treatment options, and is a complaint that involves a high risk of morbidity and mortality.  The Differential Diagnoses include electrolyte disturbance/hyper K, dehydration, sepsis, CHF, PE, ACS,  etc.  Critical Interventions-    Medications  Chlorhexidine Gluconate Cloth 2 % PADS 6 each (6 each Topical Given 08/11/22 0649)  Chlorhexidine Gluconate Cloth 2 % PADS 6 each (6 each Topical Given 08/11/22 0649)  aspirin EC tablet 81 mg (81 mg Oral Given 08/11/22 0822)  ALPRAZolam (XANAX) tablet 0.25 mg (0.25 mg Oral Given 08/11/22 2585)  bismuth subsalicylate (PEPTO BISMOL) 262 MG/15ML suspension 30 mL ( Oral MAR Unhold 08/07/22 1703)  linaclotide (LINZESS) capsule 72 mcg (72 mcg Oral Given 08/11/22 0611)  pantoprazole (PROTONIX) EC tablet 40 mg (40 mg Oral Given 08/11/22 0823)  sucroferric oxyhydroxide (VELPHORO) chewable tablet 1,000 mg (1,000 mg Oral Given 08/11/22 0823)  clopidogrel (PLAVIX) tablet 75 mg (75 mg Oral Given 08/11/22 0822)  gabapentin (NEURONTIN) capsule 100 mg (100 mg Oral Given 08/11/22 0823)  heparin injection 5,000 Units (5,000 Units Subcutaneous Given 08/11/22 0823)  insulin aspart (novoLOG) injection 0-9 Units (1 Units Subcutaneous Given 08/10/22 1740)  linagliptin (TRADJENTA) tablet 5 mg (5 mg Oral Given 08/11/22 0823)  sodium bicarbonate tablet 650 mg (650 mg Oral Given 08/11/22 0822)  camphor-menthol (SARNA) lotion ( Topical MAR Unhold 08/07/22 1703)  Oral care mouth rinse ( Mouth Rinse MAR Unhold 08/07/22 1703)  Chlorhexidine Gluconate Cloth 2 % PADS 6 each (6 each Topical Given 08/11/22 0649)  heparin sodium (porcine) 1000 UNIT/ML injection (has no administration in time range)  Chlorhexidine Gluconate Cloth 2 % PADS 6 each (6 each Topical Given 08/11/22 0614)  docusate sodium (COLACE) capsule 100 mg (100 mg Oral Given 08/11/22 0823)  polyethylene glycol (MIRALAX / GLYCOLAX) packet 17 g (has no administration in time range)  bisacodyl (DULCOLAX) EC tablet 5 mg (has no administration in time range)  ondansetron (ZOFRAN) injection 4 mg (has no administration in time range)  guaiFENesin-dextromethorphan (ROBITUSSIN DM) 100-10 MG/5ML syrup 15 mL (has no  administration in time range)  phenol (CHLORASEPTIC) mouth spray 1 spray (has no administration in time range)  nutrition supplement (JUVEN) (JUVEN) powder packet 1 packet (1 packet Oral Given 08/11/22 2778)  ascorbic acid (  VITAMIN C) tablet 1,000 mg (1,000 mg Oral Given 08/11/22 0823)  zinc sulfate capsule 220 mg (220 mg Oral Given 08/11/22 0823)  sodium chloride flush (NS) 0.9 % injection 3 mL (3 mLs Intravenous Given 08/10/22 2133)  sodium chloride flush (NS) 0.9 % injection 3 mL (3 mLs Intravenous Given 08/09/22 1056)  0.9 %  sodium chloride infusion (has no administration in time range)  acetaminophen (TYLENOL) tablet 325-650 mg (650 mg Oral Given 08/11/22 0611)  morphine (PF) 2 MG/ML injection 2 mg (2 mg Intravenous Given 08/08/22 1801)  fentaNYL (SUBLIMAZE) 100 MCG/2ML injection (has no administration in time range)  feeding supplement (NEPRO CARB STEADY) liquid 237 mL (237 mLs Oral Not Given 08/10/22 1308)  (feeding supplement) PROSource Plus liquid 30 mL (30 mLs Oral Given 08/11/22 0822)  oxyCODONE (Oxy IR/ROXICODONE) immediate release tablet 5 mg (5 mg Oral Given 08/11/22 0611)  sodium chloride 0.9 % bolus 500 mL (500 mLs Intravenous New Bag/Given 08/05/22 1250)  sodium zirconium cyclosilicate (LOKELMA) packet 10 g (10 g Oral Given 08/05/22 1305)  calcium gluconate inj 10% (1 g) URGENT USE ONLY! (1 g Intravenous Given 08/05/22 1310)  albuterol (PROVENTIL) (2.5 MG/3ML) 0.083% nebulizer solution 10 mg (10 mg Nebulization Given 08/05/22 1306)  insulin aspart (novoLOG) injection 5 Units (5 Units Intravenous Given 08/05/22 1312)    And  dextrose 50 % solution 50 mL (50 mLs Intravenous Given 08/05/22 1312)  sodium bicarbonate injection 50 mEq (50 mEq Intravenous Given 08/05/22 1311)  sodium chloride 0.9 % bolus 250 mL (250 mLs Intravenous New Bag/Given 08/05/22 2305)  vancomycin (VANCOREADY) IVPB 1750 mg/350 mL (0 mg Intravenous Stopped 08/06/22 1839)  ceFAZolin (ANCEF) IVPB 2g/100 mL  premix (2 g Intravenous Given 08/07/22 1440)  heparin sodium (porcine) injection 8,000 Units (8,000 Units Intravenous Given 08/06/22 1520)  chlorhexidine (PERIDEX) 0.12 % solution 15 mL (15 mLs Mouth/Throat Given 08/07/22 1234)    Or  Oral care mouth rinse ( Mouth Rinse See Alternative 08/07/22 1234)  naloxone (NARCAN) 0.4 MG/ML injection (0.4 mg  Given 08/08/22 0834)  albumin human 25 % solution 25 g (25 g Intravenous New Bag/Given 08/10/22 0912)  midodrine (PROAMATINE) tablet 10 mg (10 mg Oral Given 08/08/22 0918)  heparin sodium (porcine) 1000 UNIT/ML injection (3,800 Units  Given 08/10/22 1310)    Reassessment after intervention: EKG stabilizing after Ca and K+ shifting.   I decided to review pertinent External Data, and in summary patient with similar presentation in September of this year ultimately found to have hyperkalemia and hypotension. Improved with emergent HD.   Clinical Laboratory Tests Ordered, included hyperkalemia noted. Baseline ESRD.   Radiologic Tests Ordered, included CXR. I independently interpreted the images and agree with radiology interpretation.   Cardiac Monitor Tracing which shows bradycardia.   Social Determinants of Health Risk patient is not an active smoker.   Consult complete with Nephrology. Plan for HD. Hospitalist to admit.   Medical Decision Making: Summary:  Patient presents to the emergency department with generalized weakness, nausea, vomiting.  EKG on arrival is bradycardia with likely junctional type rhythm.  Suspect acute electrolyte disturbance and will send labs urgently.  Plan for volume resuscitation.  Sepsis on the differential as well but will need to assess electrolytes and move forward from there.   Reevaluation with update and discussion with patient and family. Plan for ESRD and admit.    Patient's presentation is most consistent with acute presentation with potential threat to life or bodily function.   Disposition:  admit  ____________________________________________  FINAL CLINICAL IMPRESSION(S) / ED DIAGNOSES  Final diagnoses:  Hyperkalemia  Hypotension, unspecified hypotension type      Note:  This document was prepared using Dragon voice recognition software and may include unintentional dictation errors.  Nanda Quinton, MD, [facility] Emergency Medicine    Hodari Chuba, [facility], MD 08/11/22 [phone]

## 2022-08-05 NOTE — Procedures (Signed)
I was present at this dialysis session. I have reviewed the session itself and made appropriate changes.   Vital signs in last 24 hours:  Temp:  [97.5 F (36.4 C)] 97.5 F (36.4 C) (12/18 1135) Pulse Rate:  [44] 44 (12/18 1135) Resp:  [20] 20 (12/18 1135) BP: (89)/(54) 89/54 (12/18 1135) SpO2:  [96 %] 96 % (12/18 1135) Weight:  [90.3 kg-90.4 kg] 90.4 kg (12/18 1444) Weight change:  Filed Weights   08/05/22 1135 08/05/22 1444  Weight: 90.3 kg 90.4 kg    Recent Labs  Lab 08/05/22 1240 08/05/22 1248  NA 139 135  K 7.0* 7.0*  CL 92* 100  CO2 12*  --   GLUCOSE 105* 102*  BUN 71* 84*  CREATININE 8.84* 8.90*  CALCIUM 10.2  --     Recent Labs  Lab 08/05/22 1240 08/05/22 1248  WBC 12.0*  --   NEUTROABS 9.7*  --   HGB 11.1* 12.9*  HCT 36.3* 38.0*  MCV 98.1  --   PLT 270  --     Scheduled Meds:  [START ON 08/06/2022] Chlorhexidine Gluconate Cloth  6 each Topical Q0600   [START ON 08/06/2022] Chlorhexidine Gluconate Cloth  6 each Topical Q0600   heparin  8,000 Units Dialysis Once in dialysis   heparin  8,000 Units Dialysis Once in dialysis   Continuous Infusions:  anticoagulant sodium citrate     PRN Meds:.alteplase, anticoagulant sodium citrate, heparin, lidocaine (PF), lidocaine-prilocaine, pentafluoroprop-tetrafluoroeth   Donetta Potts,  MD 08/05/2022, 2:57 PM

## 2022-08-05 NOTE — Consult Note (Addendum)
[facility] NOTE   MRN : [id number]  Reason for Consult: left LE PAD with tissue loss ischemia Referring Physician: Dr. Roosevelt Locks  History of Present Illness: 51 y/o male know nt o our service with history of HD needs and PAD.  Previous intervention right LE that failed and he now has right AKA with revision 01/08/22 by Dr. Donzetta Matters.  He has had Laser atherectomy left popliteal, left tibioperoneal trunk and left peroneal arteries, balloon angioplasty left popliteal artery and Plain balloon angioplasty of left tibioperoneal trunk with 3.5 mm coyote and plain balloon angioplasty of left peroneal artery with 3 mm [place] by Dr. Donzetta Matters on 04/09/21 for non healing wound left LE.  He was last seen 07/31/22 for debridement. He has had several finger amputated in the past for tissue loss necrosis.    He states his son helps care for him and noticed increased wounds over the past week.  He has been followed in the past by OP wound care center.  He denies fever and chills.  He is not ambulatory, but dose use the left LE for transfers.       Current Facility-Administered Medications  Medication Dose Route Frequency Provider Last Rate Last Admin   [START ON 08/06/2022] acetaminophen (TYLENOL) tablet 1,000 mg  1,000 mg Oral Q T,Th,Sa-HD Lequita Halt, MD       ALPRAZolam Duanne Moron) tablet 0.25 mg  0.25 mg Oral Daily Wynetta Fines T, MD       alteplase (CATHFLO ACTIVASE) injection 2 mg  2 mg Intracatheter Once PRN Janalee Dane, PA-C       anticoagulant sodium citrate solution 5 mL  5 mL Intracatheter PRN Collins, Samantha G, PA-C       aspirin EC tablet 81 mg  81 mg Oral Daily Wynetta Fines T, MD       bismuth subsalicylate (PEPTO BISMOL) 262 MG/15ML suspension 30 mL  30 mL Oral Q6H PRN Lequita Halt, MD       [START ON 08/06/2022] Chlorhexidine Gluconate Cloth 2 % PADS 6 each  6 each Topical Q0600 Janalee Dane, PA-C       [START ON 08/06/2022] Chlorhexidine Gluconate Cloth  2 % PADS 6 each  6 each Topical Q0600 Janalee Dane, PA-C       clopidogrel (PLAVIX) tablet 75 mg  75 mg Oral Daily Wynetta Fines T, MD       gabapentin (NEURONTIN) capsule 100 mg  100 mg Oral TID Wynetta Fines T, MD       heparin injection 1,000 Units  1,000 Units Intracatheter PRN Janalee Dane, PA-C       heparin injection 5,000 Units  5,000 Units Subcutaneous Q12H Wynetta Fines T, MD       heparin injection 8,000 Units  8,000 Units Dialysis Once in dialysis [place], Hervey Ard, PA-C       heparin injection 8,000 Units  8,000 Units Dialysis Once in dialysis [place], Samantha G, PA-C       insulin aspart (novoLOG) injection 0-9 Units  0-9 Units Subcutaneous TID WC Wynetta Fines T, MD       lidocaine (PF) (XYLOCAINE) 1 % injection 5 mL  5 mL Intradermal PRN Janalee Dane, PA-C       lidocaine-prilocaine (EMLA) cream 1 Application  1 Application Topical PRN Janalee Dane, PA-C       [START ON 08/06/2022] linaclotide (LINZESS) capsule 72 mcg  72 mcg Oral q AM Roosevelt Locks,  Ralene Cork, MD       linagliptin (TRADJENTA) tablet 5 mg  5 mg Oral Daily Wynetta Fines T, MD       oxyCODONE (Oxy IR/ROXICODONE) immediate release tablet 5 mg  5 mg Oral Q6H PRN Lequita Halt, MD       pantoprazole (PROTONIX) EC tablet 40 mg  40 mg Oral Daily Lequita Halt, MD       pentafluoroprop-tetrafluoroeth (GEBAUERS) aerosol 1 Application  1 Application Topical PRN Theda Sers, Samantha G, PA-C       sodium bicarbonate tablet 650 mg  650 mg Oral BID Lequita Halt, MD       sucroferric oxyhydroxide [facility]) chewable tablet 1,000 mg  1,000 mg Oral BID Wynetta Fines T, MD        Pt meds include: Statin :Yes Betablocker: No ASA: Yes Other anticoagulants/antiplatelets: Plavix  Past Medical History:  Diagnosis Date   Allergy    Anemia    getting Venofer with dialysis   Anxiety    self reported, sometimes-situational anxiety   Arthritis    bilateral hands/LEFT hip   CHF (congestive heart failure) ([place])    Chronic  kidney disease    t th s dialysis- [place] ---ESRD   Coronary artery disease    Depression    self reported, sometimes-situational depression   Diabetes mellitus    type II- on meds   Dyspnea    sometimes- fluid   GERD (gastroesophageal reflux disease)    on meds   History of blood transfusion    Hypertension    no longer on medication since starting on Hemodialysis   Myocardial infarction ([place]) 2019   Peripheral vascular disease ([place])    Sleep apnea    does not use CPAP    Past Surgical History:  Procedure Laterality Date   A/V FISTULAGRAM Left 03/06/2020   Procedure: A/V FISTULAGRAM;  Surgeon: Waynetta Sandy, MD;  Location: [place] [facility];  Service: Cardiovascular;  Laterality: Left;   ABDOMINAL AORTOGRAM N/A 01/07/2022   Procedure: ABDOMINAL AORTOGRAM;  Surgeon: Waynetta Sandy, MD;  Location: [place] [facility];  Service: Cardiovascular;  Laterality: N/A;   ABDOMINAL AORTOGRAM W/LOWER EXTREMITY N/A 04/09/2021   Procedure: ABDOMINAL AORTOGRAM W/LOWER EXTREMITY;  Surgeon: Waynetta Sandy, MD;  Location: [place] [facility];  Service: Cardiovascular;  Laterality: N/A;   AMPUTATION Left 05/10/2016   Procedure: Left Transmetatarsal Amputation;  Surgeon: [name], MD;  Location: [place];  Service: Orthopedics;  Laterality: Left;   AMPUTATION Right 05/16/2018   Procedure: PARTIAL RAY AMPUTATION FIFTH TOE RIGHT FOOT;  Surgeon: [name], DPM;  Location: [place];  Service: Podiatry;  Laterality: Right;   AMPUTATION Right 06/17/2018   Procedure: AMPUTATION 4th RAY Right Foot;  Surgeon: Trula Slade, DPM;  Location: [place];  Service: Podiatry;  Laterality: Right;   AMPUTATION Right 06/19/2018   Procedure: RIGHT BELOW KNEE AMPUTATION;  Surgeon: [name], MD;  Location: [place];  Service: Orthopedics;  Laterality: Right;   AMPUTATION Left 10/05/2021   Procedure: LEFT RING FINGER AMPUTATION;  Surgeon: Daryll Brod, MD;  Location: [place];   Service: Orthopedics;  Laterality: Left;  45 MIN   AMPUTATION Left 11/15/2021   Procedure: LEFT INDEX FINGER AMPUTATION;  Surgeon: Leanora Cover, MD;  Location: [place];  Service: Orthopedics;  Laterality: Left;  60 MIN   AMPUTATION Right 01/08/2022   Procedure: AMPUTATION ABOVE KNEE;  Surgeon: Waynetta Sandy, MD;  Location: [facility]  OR;  Service: Vascular;  Laterality: Right;   AMPUTATION Bilateral 01/12/2022   Procedure: LEFT INDEX FINGER AMPUTATION, RIGHT INDEX, LONG AND SMALL DIGITS AMPUTATION;  Surgeon: Leanora Cover, MD;  Location: [place];  Service: Orthopedics;  Laterality: Bilateral;   AMPUTATION Right 04/01/2022   Procedure: RIGHT INDEX FINGER AMPUTATION AT METACARPAL PHALANGEAL JOINT, INCISION AND DRAINAGE RIGHT HAND;  Surgeon: Leanora Cover, MD;  Location: [place];  Service: Orthopedics;  Laterality: Right;  90 MIN   AMPUTATION Right 05/23/2022   Procedure: RIGHT INDEX FINGER RAY AMPUTATION; RIGHT LONG, RING, AND SMALL FINGER AMPUTATIONS;  Surgeon: Leanora Cover, MD;  Location: [place];  Service: Orthopedics;  Laterality: Right;  90 MIN   AORTIC ARCH ANGIOGRAPHY N/A 01/07/2022   Procedure: AORTIC ARCH ANGIOGRAPHY;  Surgeon: Waynetta Sandy, MD;  Location: [place] [facility];  Service: Cardiovascular;  Laterality: N/A;   APPLICATION OF WOUND VAC Right 06/19/2018   Procedure: APPLICATION OF WOUND VAC;  Surgeon: [name], MD;  Location: [place];  Service: Orthopedics;  Laterality: Right;   AV FISTULA PLACEMENT Left 11/25/2019   Procedure: LEFT ARM Brachiocephalic  ARTERIOVENOUS (AV) FISTULA CREATION;  Surgeon: Rosetta Posner, MD;  Location: [place];  Service: Vascular;  Laterality: Left;   [place] Left 07/19/2020   Procedure: LEFT SECOND STAGE [place];  Surgeon: Waynetta Sandy, MD;  Location: [place];  Service: Vascular;  Laterality: Left;   CIRCUMCISION  09/02/2011   Procedure: CIRCUMCISION ADULT;  Surgeon: Molli Hazard, MD;   Location: [place];  Service: Urology;  Laterality: N/A;   CORONARY STENT INTERVENTION N/A 07/29/2018   Procedure: CORONARY STENT INTERVENTION;  Surgeon: Leonie Man, MD;  Location: [place] [facility];  Service: Cardiovascular;  Laterality: N/A;   DEBRIDEMENT AND CLOSURE WOUND Left 11/15/2021   Procedure: LEFT HAND WOUND CLOSURE;  Surgeon: Leanora Cover, MD;  Location: [place];  Service: Orthopedics;  Laterality: Left;   I & D EXTREMITY  09/02/2011   Procedure: IRRIGATION AND DEBRIDEMENT EXTREMITY;  Surgeon: Theodoro Kos, DO;  Location: [place];  Service: Plastics;  Laterality: N/A;   INCISION AND DRAINAGE OF WOUND  08/12/2011   Procedure: IRRIGATION AND DEBRIDEMENT WOUND;  Surgeon: Zenovia Jarred, MD;  Location: [place];  Service: General;;  perineum   INSERTION OF DIALYSIS CATHETER N/A 11/25/2019   Procedure: INSERTION OF Righ Internal Jugular DIALYSIS CATHETER;  Surgeon: Rosetta Posner, MD;  Location: [place];  Service: Vascular;  Laterality: N/A;   [place]  08/12/2011   Procedure: IRRIGATION AND DEBRIDEMENT ABSCESS;  Surgeon: Molli Hazard, MD;  Location: [place];  Service: Urology;  Laterality: N/A;  irrigation and debridment perineum     IRRIGATION AND DEBRIDEMENT ABSCESS  08/14/2011   Procedure: MINOR INCISION AND DRAINAGE OF ABSCESS;  Surgeon: Molli Hazard, MD;  Location: [place];  Service: Urology;  Laterality: N/A;  Perineal Wound Debridement;Placement Bilateral Testicular Thigh Pouches   LEFT HEART CATH AND CORONARY ANGIOGRAPHY N/A 07/29/2018   Procedure: LEFT HEART CATH AND CORONARY ANGIOGRAPHY;  Surgeon: Leonie Man, MD;  Location: [place] [facility];  Service: Cardiovascular;  Laterality: N/A;   LOWER EXTREMITY ANGIOGRAPHY Bilateral 01/07/2022   Procedure: Lower Extremity Angiography;  Surgeon: Waynetta Sandy, MD;  Location: [place] [facility];  Service: Cardiovascular;  Laterality: Bilateral;    PERIPHERAL VASCULAR ATHERECTOMY Left 04/09/2021   Procedure: PERIPHERAL VASCULAR ATHERECTOMY;  Surgeon: Waynetta Sandy, MD;  Location: [place] [facility];  Service: Cardiovascular;  Laterality: Left;  TP trunk and peroneal arteries   PERIPHERAL VASCULAR BALLOON ANGIOPLASTY Left 04/09/2021   Procedure: PERIPHERAL VASCULAR BALLOON ANGIOPLASTY;  Surgeon: Waynetta Sandy, MD;  Location: [place] [facility];  Service: Cardiovascular;  Laterality: Left;  Popliteal   REVISON OF ARTERIOVENOUS FISTULA Left 05/24/2020   Procedure: LEFT ARM ARTERIOVENOUS FISTULA  CONVERSION TO BASILIC VEIN FISTULA;  Surgeon: Waynetta Sandy, MD;  Location: [place];  Service: Vascular;  Laterality: Left;   TOTAL HIP ARTHROPLASTY Left 10/11/2016   Procedure: LEFT TOTAL HIP ARTHROPLASTY ANTERIOR APPROACH;  Surgeon: Mcarthur Rossetti, MD;  [facility]: [facility];  Service: Orthopedics;  Laterality: Left;   UPPER EXTREMITY ANGIOGRAPHY Bilateral 01/07/2022   Procedure: Upper Extremity Angiography;  Surgeon: Waynetta Sandy, MD;  Location: [place] [facility];  Service: Cardiovascular;  Laterality: Bilateral;    Social History Social History   Tobacco Use   Smoking status: Former    Packs/day: 0.25    Years: 24.00    Total pack years: 6.00    Types: Cigarettes    Quit date: 11/19/2019    Years since quitting: 2.7   Smokeless tobacco: Never  Vaping Use   Vaping Use: Never used  Substance Use Topics   Alcohol use: Not Currently    Alcohol/week: 0.0 standard drinks of alcohol    Comment: quit 2018   Drug use: No    Family History Family History  Problem Relation Age of Onset   Diabetes Other    Pancreatic cancer Mother 73   Colon cancer Neg Hx    Colon polyps Neg Hx    Esophageal cancer Neg Hx    Rectal cancer Neg Hx    Stomach cancer Neg Hx     Allergies  Allergen Reactions   Crestor [Rosuvastatin] Rash    Eye redness   Dilaudid [Hydromorphone] Itching   Robaxin  [Methocarbamol] Other (See Comments)    Pt states medication made his arm pain worse.   Benadryl [Diphenhydramine] Rash     REVIEW OF SYSTEMS  General: '[ ]'$  Weight loss, '[ ]'$  Fever, '[ ]'$  chills Neurologic: '[ ]'$  Dizziness, '[ ]'$  Blackouts, '[ ]'$  Seizure '[ ]'$  Stroke, '[ ]'$  "Mini stroke", '[ ]'$  Slurred speech, '[ ]'$  Temporary blindness; '[ ]'$  weakness in arms or legs, '[ ]'$  Hoarseness '[ ]'$  Dysphagia Cardiac: '[ ]'$  Chest pain/pressure, '[ ]'$  Shortness of breath at rest '[ ]'$  Shortness of breath with exertion, '[ ]'$  Atrial fibrillation or irregular heartbeat  Vascular: '[ ]'$  Pain in legs with walking, '[ ]'$  Pain in legs at rest, '[ ]'$  Pain in legs at night,  [x ] Non-healing ulcer, '[ ]'$  Blood clot in vein/DVT,   Pulmonary: '[ ]'$  Home oxygen, '[ ]'$  Productive cough, '[ ]'$  Coughing up blood, '[ ]'$  Asthma,  '[ ]'$  Wheezing '[ ]'$  COPD Musculoskeletal:  '[ ]'$  Arthritis, '[ ]'$  Low back pain, '[ ]'$  Joint pain Hematologic: '[ ]'$  Easy Bruising, [x ] Anemia; '[ ]'$  Hepatitis Gastrointestinal: '[ ]'$  Blood in stool, '[ ]'$  Gastroesophageal Reflux/heartburn, Urinary: '[ ]'$  chronic Kidney disease, [x ] on HD - '[ ]'$  MWF or '[ ]'$  TTHS, '[ ]'$  Burning with urination, '[ ]'$  Difficulty urinating Skin: '[ ]'$  Rashes, '[ ]'$  Wounds Psychological: '[ ]'$  Anxiety, '[ ]'$  Depression  Physical Examination Vitals:   08/05/22 1500 08/05/22 1530 08/05/22 1556 08/05/22 1600  BP: 129/77 (!) 127/54  (!) 104/48  Pulse:  Resp: '19 15 20 16  '$ Temp:      TempSrc:      SpO2:  98%  96%  Weight:      Height:       Body mass index is 26.29 kg/m.  General:  WDWN in NAD HENT: WNL Eyes: Pupils equal Pulmonary: normal non-labored breathing , without Rales, rhonchi,  wheezing Cardiac: RRR, without  Murmurs, rubs or gallops; No carotid bruits Abdomen: soft, NT, no masses Skin: no rashes, ulcers ;  positive Gangrene , no cellulitis; positive open wounds;      Vascular Exam/Pulses:no doppler signals note, left foot cool to touch   Musculoskeletal: no muscle wasting or atrophy; no  edema  Neurologic: A&O X 3; Appropriate Affect ;  SENSATION:decreased sensation left LE MOTOR FUNCTION:minimal mobility Speech is fluent/normal   Significant Diagnostic Studies: CBC Lab Results  Component Value Date   WBC 12.0 (H) 08/05/2022   HGB 12.9 (L) 08/05/2022   HCT 38.0 (L) 08/05/2022   MCV 98.1 08/05/2022   PLT 270 08/05/2022    BMET    Component Value Date/Time   NA 135 08/05/2022 1248   NA 140 04/30/2019 1507   K 7.0 (HH) 08/05/2022 1248   CL 100 08/05/2022 1248   CO2 12 (L) 08/05/2022 1240   GLUCOSE 102 (H) 08/05/2022 1248   BUN 84 (H) 08/05/2022 1248   BUN 37 (H) 04/30/2019 1507   CREATININE 8.90 (H) 08/05/2022 1248   CREATININE 1.70 (H) 06/15/2018 1458   CALCIUM 10.2 08/05/2022 1240   GFRNONAA 7 (L) 08/05/2022 1240   GFRAA 22 (L) 11/26/2019 1151   Estimated Creatinine Clearance: 11 mL/min (A) (by C-G formula based on SCr of 8.9 mg/dL (H)).  COAG Lab Results  Component Value Date   INR 1.5 (H) 05/10/2022   INR 1.2 09/28/2020   INR 1.3 (H) 09/27/2020        ASSESSMENT/PLAN:  51 y/o male with long history of DM, ESRD, and PAD with chronic non healing ulcer left LE.  He has under gone endo vascular interventions with OP wound care.  The tissue loss is great on the left LE and on previous angiogram his main runoff was the peroneal artery.  He does not have an intact doppler signal.    Leukocytosis with ischemic wound left LE.  He will need primary left LE amputation.  The wounds appear too significant to heal.     Roxy Horseman 08/05/2022 4:55 PM   VASCULAR STAFF ADDENDUM: I have independently interviewed and examined the patient. I agree with the above.  Extensive left lower extremity wounds. Per Jeneen Rinks, the sizes of his wounds have continued to increase. 12/2021 Angiogram demonstrated single vessel peroneal flow to the level of the ankle with no flow in the left foot.  Unfortunately, [facility] does not have any revascularization options.   Le[place] would benefit from left-sided above-knee amputation.  I have asked Dr. Sharol Given see the patient as well to provide Jeneen Rinks with a second opinion as he asked for everything possible to be done for limb salvage.    Cassandria Santee, MD [facility] Phone Number: [phone] 08/05/2022 5:17 PM

## 2022-08-05 NOTE — Consult Note (Addendum)
[facility] Renal Consultation Note    Indication for Consultation:  Management of ESRD/hemodialysis, anemia, hypertension/volume, and secondary hyperparathyroidism.  HPI: Edward [name] is a 51 y.o. male with PMH including ESRD on dialysis, CHF, CAD, HTN, and PVD who presented to the ED with nausea and dizziness. Reports one episode of vomiting today. Was also being treated for lower extremity wounds. In the ED, he was bradycardic with HR 44, afebrile, BP 89/54. K+ 7.0, CO2 12, BUN 84, WBC 12, Hgb 12.9, Plt 270, respiratory panel negative. EKG with wide QRS complex and bradycardia. Patient reports he was late to his past two dialysis treatments because he overslept. Respiratory panel negative. Also reports he was being treated for a lower extremity wound, was on levofloxacin but his son believes he is now on cipro. No fever or chills. No recent diarrhea or melena. Denies CP, palpitations, headache and diarrhea. Wearing O2 at present but has not felt short of breath at all today. He was given temporizing measures in the ED including calcium gluconate, insulin/dextrose, sodium bicarb, and lokelma.   Past Medical History:  Diagnosis Date   Allergy    Anemia    getting Venofer with dialysis   Anxiety    self reported, sometimes-situational anxiety   Arthritis    bilateral hands/LEFT hip   CHF (congestive heart failure) (HCC)    Chronic kidney disease    t th s dialysis- [place] ---ESRD   Coronary artery disease    Depression    self reported, sometimes-situational depression   Diabetes mellitus    type II- on meds   Dyspnea    sometimes- fluid   GERD (gastroesophageal reflux disease)    on meds   History of blood transfusion    Hypertension    no longer on medication since starting on Hemodialysis   Myocardial infarction ([place]) 2019   Peripheral vascular disease ([place])    Sleep apnea    does not use CPAP   Past Surgical History:  Procedure Laterality Date   A/V  FISTULAGRAM Left 03/06/2020   Procedure: A/V FISTULAGRAM;  Surgeon: Waynetta Sandy, MD;  Location: [place] [facility];  Service: Cardiovascular;  Laterality: Left;   ABDOMINAL AORTOGRAM N/A 01/07/2022   Procedure: ABDOMINAL AORTOGRAM;  Surgeon: Waynetta Sandy, MD;  Location: [place] [facility];  Service: Cardiovascular;  Laterality: N/A;   ABDOMINAL AORTOGRAM W/LOWER EXTREMITY N/A 04/09/2021   Procedure: ABDOMINAL AORTOGRAM W/LOWER EXTREMITY;  Surgeon: Waynetta Sandy, MD;  Location: [place] [facility];  Service: Cardiovascular;  Laterality: N/A;   AMPUTATION Left 05/10/2016   Procedure: Left Transmetatarsal Amputation;  Surgeon: [name], MD;  Location: [place];  Service: Orthopedics;  Laterality: Left;   AMPUTATION Right 05/16/2018   Procedure: PARTIAL RAY AMPUTATION FIFTH TOE RIGHT FOOT;  Surgeon: [name], DPM;  Location: [place];  Service: Podiatry;  Laterality: Right;   AMPUTATION Right 06/17/2018   Procedure: AMPUTATION 4th RAY Right Foot;  Surgeon: Trula Slade, DPM;  Location: [place];  Service: Podiatry;  Laterality: Right;   AMPUTATION Right 06/19/2018   Procedure: RIGHT BELOW KNEE AMPUTATION;  Surgeon: [name], MD;  Location: [place];  Service: Orthopedics;  Laterality: Right;   AMPUTATION Left 10/05/2021   Procedure: LEFT RING FINGER AMPUTATION;  Surgeon: Daryll Brod, MD;  Location: [place];  Service: Orthopedics;  Laterality: Left;  45 MIN   AMPUTATION Left 11/15/2021   Procedure: LEFT INDEX FINGER AMPUTATION;  Surgeon: Leanora Cover,  MD;  Location: [place];  Service: Orthopedics;  Laterality: Left;  60 MIN   AMPUTATION Right 01/08/2022   Procedure: AMPUTATION ABOVE KNEE;  Surgeon: Waynetta Sandy, MD;  Location: [place];  Service: Vascular;  Laterality: Right;   AMPUTATION Bilateral 01/12/2022   Procedure: LEFT INDEX FINGER AMPUTATION, RIGHT INDEX, LONG AND SMALL DIGITS AMPUTATION;  Surgeon: Leanora Cover, MD;  Location: [place];   Service: Orthopedics;  Laterality: Bilateral;   AMPUTATION Right 04/01/2022   Procedure: RIGHT INDEX FINGER AMPUTATION AT METACARPAL PHALANGEAL JOINT, INCISION AND DRAINAGE RIGHT HAND;  Surgeon: Leanora Cover, MD;  Location: [place];  Service: Orthopedics;  Laterality: Right;  90 MIN   AMPUTATION Right 05/23/2022   Procedure: RIGHT INDEX FINGER RAY AMPUTATION; RIGHT LONG, RING, AND SMALL FINGER AMPUTATIONS;  Surgeon: Leanora Cover, MD;  Location: [place];  Service: Orthopedics;  Laterality: Right;  90 MIN   AORTIC ARCH ANGIOGRAPHY N/A 01/07/2022   Procedure: AORTIC ARCH ANGIOGRAPHY;  Surgeon: Waynetta Sandy, MD;  Location: [place] [facility];  Service: Cardiovascular;  Laterality: N/A;   APPLICATION OF WOUND VAC Right 06/19/2018   Procedure: APPLICATION OF WOUND VAC;  Surgeon: [name], MD;  Location: [place];  Service: Orthopedics;  Laterality: Right;   AV FISTULA PLACEMENT Left 11/25/2019   Procedure: LEFT ARM Brachiocephalic  ARTERIOVENOUS (AV) FISTULA CREATION;  Surgeon: Rosetta Posner, MD;  Location: [place];  Service: Vascular;  Laterality: Left;   [place] Left 07/19/2020   Procedure: LEFT SECOND STAGE [place];  Surgeon: Waynetta Sandy, MD;  Location: [place];  Service: Vascular;  Laterality: Left;   CIRCUMCISION  09/02/2011   Procedure: CIRCUMCISION ADULT;  Surgeon: Molli Hazard, MD;  Location: [place];  Service: Urology;  Laterality: N/A;   CORONARY STENT INTERVENTION N/A 07/29/2018   Procedure: CORONARY STENT INTERVENTION;  Surgeon: Leonie Man, MD;  Location: [place] [facility];  Service: Cardiovascular;  Laterality: N/A;   DEBRIDEMENT AND CLOSURE WOUND Left 11/15/2021   Procedure: LEFT HAND WOUND CLOSURE;  Surgeon: Leanora Cover, MD;  Location: [place];  Service: Orthopedics;  Laterality: Left;   I & D EXTREMITY  09/02/2011   Procedure: IRRIGATION AND DEBRIDEMENT EXTREMITY;  Surgeon: Theodoro Kos, DO;   Location: [place];  Service: Plastics;  Laterality: N/A;   INCISION AND DRAINAGE OF WOUND  08/12/2011   Procedure: IRRIGATION AND DEBRIDEMENT WOUND;  Surgeon: Zenovia Jarred, MD;  Location: [place];  Service: General;;  perineum   INSERTION OF DIALYSIS CATHETER N/A 11/25/2019   Procedure: INSERTION OF Righ Internal Jugular DIALYSIS CATHETER;  Surgeon: Rosetta Posner, MD;  Location: [place];  Service: Vascular;  Laterality: N/A;   [place]  08/12/2011   Procedure: IRRIGATION AND DEBRIDEMENT ABSCESS;  Surgeon: Molli Hazard, MD;  Location: [place];  Service: Urology;  Laterality: N/A;  irrigation and debridment perineum     IRRIGATION AND DEBRIDEMENT ABSCESS  08/14/2011   Procedure: MINOR INCISION AND DRAINAGE OF ABSCESS;  Surgeon: Molli Hazard, MD;  Location: [place];  Service: Urology;  Laterality: N/A;  Perineal Wound Debridement;Placement Bilateral Testicular Thigh Pouches   LEFT HEART CATH AND CORONARY ANGIOGRAPHY N/A 07/29/2018   Procedure: LEFT HEART CATH AND CORONARY ANGIOGRAPHY;  Surgeon: Leonie Man, MD;  Location: [place] [facility];  Service: Cardiovascular;  Laterality: N/A;   LOWER EXTREMITY ANGIOGRAPHY Bilateral 01/07/2022   Procedure: Lower Extremity Angiography;  Surgeon: Donzetta Matters,  Georgia Dom, MD;  Location: [place] [facility];  Service: Cardiovascular;  Laterality: Bilateral;   PERIPHERAL VASCULAR ATHERECTOMY Left 04/09/2021   Procedure: PERIPHERAL VASCULAR ATHERECTOMY;  Surgeon: Waynetta Sandy, MD;  Location: [place] [facility];  Service: Cardiovascular;  Laterality: Left;  TP trunk and peroneal arteries   PERIPHERAL VASCULAR BALLOON ANGIOPLASTY Left 04/09/2021   Procedure: PERIPHERAL VASCULAR BALLOON ANGIOPLASTY;  Surgeon: Waynetta Sandy, MD;  Location: [place] [facility];  Service: Cardiovascular;  Laterality: Left;  Popliteal   REVISON OF ARTERIOVENOUS FISTULA Left 05/24/2020   Procedure: LEFT  ARM ARTERIOVENOUS FISTULA  CONVERSION TO BASILIC VEIN FISTULA;  Surgeon: Waynetta Sandy, MD;  Location: [place];  Service: Vascular;  Laterality: Left;   TOTAL HIP ARTHROPLASTY Left 10/11/2016   Procedure: LEFT TOTAL HIP ARTHROPLASTY ANTERIOR APPROACH;  Surgeon: Mcarthur Rossetti, MD;  Location: [facility];  Service: Orthopedics;  Laterality: Left;   UPPER EXTREMITY ANGIOGRAPHY Bilateral 01/07/2022   Procedure: Upper Extremity Angiography;  Surgeon: Waynetta Sandy, MD;  Location: [place] [facility];  Service: Cardiovascular;  Laterality: Bilateral;   Family History  Problem Relation Age of Onset   Diabetes Other    Pancreatic cancer Mother 50   Colon cancer Neg Hx    Colon polyps Neg Hx    Esophageal cancer Neg Hx    Rectal cancer Neg Hx    Stomach cancer Neg Hx    Social History:  reports that he quit smoking about 2 years ago. His smoking use included cigarettes. He has a 6.00 pack-year smoking history. He has never used smokeless tobacco. He reports that he does not currently use alcohol. He reports that he does not use drugs.  ROS: As per HPI otherwise negative.  Physical Exam: Vitals:   08/05/22 1135 08/05/22 1135  BP:  (!) 89/54  Pulse:  (!) 44  Resp:  20  Temp:  (!) 97.5 F (36.4 C)  SpO2:  96%  Weight: 90.3 kg   Height: '6\' 1"'$  (1.854 m)      General: WDWN male, alert and in NAD, on O2 face mask. Appears tired Head: Normocephalic, atraumatic, sclera non-icteric, mucus membranes are moist. Lungs: Clear bilaterally to auscultation without wheezes, rales, or rhonchi. Breathing is unlabored. Heart: bradycardic, No murmurs, rubs or gallops auscultated Abdomen: Soft, non-tender, non-distended with normoactive bowel sounds.  Lower extremities: finger amputation on R hand. R AKA, L foot in unna boot, no pitting edema appreciated Neuro: Alert and oriented X 3. Moves all extremities spontaneously. Psych:  Responds to questions appropriately with a normal  affect. Dialysis [facility]  Allergies  Allergen Reactions   Crestor [Rosuvastatin] Rash    Eye redness   Dilaudid [Hydromorphone] Itching   Robaxin [Methocarbamol] Other (See Comments)    Pt states medication made his arm pain worse.   Benadryl [Diphenhydramine] Rash   Prior to Admission medications   Medication Sig Start Date End Date Taking? Authorizing Provider  acetaminophen (TYLENOL) 500 MG tablet Take 1,000 mg by mouth Every Tuesday,Thursday,and Saturday with dialysis.    [provider]  ALPRAZolam Duanne Moron) 0.25 MG tablet Take 0.25 mg by mouth daily. 04/29/22   [provider]  aspirin EC 81 MG tablet Take 81 mg by mouth daily. Swallow whole.    [provider]  atorvastatin (LIPITOR) 80 MG tablet TAKE ONE TABLET BY MOUTH ONCE DAILY 08/01/22   Lelon Perla, MD  bismuth subsalicylate (PEPTO BISMOL) 262 MG/15ML suspension Take 30 mLs by mouth every  6 (six) hours as needed for indigestion.    [provider]  clopidogrel (PLAVIX) 75 MG tablet TAKE ONE TABLET BY MOUTH ONCE DAILY Patient taking differently: Take 75 mg by mouth daily. 02/01/22   Waynetta Sandy, MD  ezetimibe (ZETIA) 10 MG tablet Take 1 tablet (10 mg total) by mouth daily. Patient not taking: Reported on 05/11/2022 01/17/22   Edwin Dada, MD  gabapentin (NEURONTIN) 100 MG capsule Take 100 mg by mouth 3 (three) times daily. 12/03/21   [provider]  HUMALOG MIX 75/25 KWIKPEN (75-25) 100 UNIT/ML KwikPen Inject 30 Units into the skin daily. 05/04/22   [provider]  insulin aspart protamine - aspart (NOVOLOG MIX 70/30 FLEXPEN) (70-30) 100 UNIT/ML FlexPen Inject 0.3 mLs (30 Units total) into the skin 2 (two) times daily with a meal. Patient taking differently: Inject 0-30 Units into the skin 2 (two) times daily as needed. 09/29/20   Lavina Hamman, MD  Insulin Pen Needle 31G X 5 MM MISC Use daily to inject insulin as instructed 07/01/18   Angiulli,  Lavon Paganini, PA-C  linaclotide [facility]) 72 MCG capsule Take 1 capsule (72 mcg total) by mouth daily before breakfast. Patient taking differently: Take 72 mcg by mouth in the morning. 12/13/20   Armbruster, Carlota Raspberry, MD  oxyCODONE (ROXICODONE) 5 MG immediate release tablet 1-2 tabs PO q6 hours prn pain 05/23/22   Leanora Cover, MD  pantoprazole (PROTONIX) 40 MG tablet Take 1 tablet (40 mg total) by mouth daily. 08/01/22   Willia Craze, NP  sulfamethoxazole-trimethoprim (BACTRIM DS) 800-160 MG tablet Take 1 tablet by mouth 2 (two) times daily. 05/17/22   [provider]  VELPHORO 500 MG chewable tablet Chew 1,000 mg by mouth 2 (two) times daily. 06/14/20   [provider]   Current Facility-Administered Medications  Medication Dose Route Frequency Provider Last Rate Last Admin   albuterol (PROVENTIL) (2.5 MG/3ML) 0.083% nebulizer solution 10 mg  10 mg Nebulization Once Long, Wonda Olds, MD       calcium gluconate inj 10% (1 g) URGENT USE ONLY!  1 g Intravenous Once Long, Wonda Olds, MD       insulin aspart (novoLOG) injection 5 Units  5 Units Intravenous Once Long, Wonda Olds, MD       And   dextrose 50 % solution 50 mL  1 ampule Intravenous Once Long, Wonda Olds, MD       sodium bicarbonate injection 50 mEq  50 mEq Intravenous Once Long, Wonda Olds, MD       sodium zirconium cyclosilicate (LOKELMA) packet 10 g  10 g Oral Once Long, Wonda Olds, MD       Current Outpatient Medications  Medication Sig Dispense Refill   acetaminophen (TYLENOL) 500 MG tablet Take 1,000 mg by mouth Every Tuesday,Thursday,and Saturday with dialysis.     ALPRAZolam (XANAX) 0.25 MG tablet Take 0.25 mg by mouth daily.     aspirin EC 81 MG tablet Take 81 mg by mouth daily. Swallow whole.     atorvastatin (LIPITOR) 80 MG tablet TAKE ONE TABLET BY MOUTH ONCE DAILY 90 tablet 3   bismuth subsalicylate (PEPTO BISMOL) 262 MG/15ML suspension Take 30 mLs by mouth every 6 (six) hours as needed for indigestion.      clopidogrel (PLAVIX) 75 MG tablet TAKE ONE TABLET BY MOUTH ONCE DAILY (Patient taking differently: Take 75 mg by mouth daily.) 30 tablet 11   ezetimibe (ZETIA) 10 MG tablet Take 1 tablet (10  mg total) by mouth daily. (Patient not taking: Reported on 05/11/2022) 30 tablet 3   gabapentin (NEURONTIN) 100 MG capsule Take 100 mg by mouth 3 (three) times daily.     HUMALOG MIX 75/25 KWIKPEN (75-25) 100 UNIT/ML KwikPen Inject 30 Units into the skin daily.     insulin aspart protamine - aspart (NOVOLOG MIX 70/30 FLEXPEN) (70-30) 100 UNIT/ML FlexPen Inject 0.3 mLs (30 Units total) into the skin 2 (two) times daily with a meal. (Patient taking differently: Inject 0-30 Units into the skin 2 (two) times daily as needed.) 15 mL 0   Insulin Pen Needle 31G X 5 MM MISC Use daily to inject insulin as instructed 100 each 2   linaclotide (LINZESS) 72 MCG capsule Take 1 capsule (72 mcg total) by mouth daily before breakfast. (Patient taking differently: Take 72 mcg by mouth in the morning.) 8 capsule 0   oxyCODONE (ROXICODONE) 5 MG immediate release tablet 1-2 tabs PO q6 hours prn pain 20 tablet 0   pantoprazole (PROTONIX) 40 MG tablet Take 1 tablet (40 mg total) by mouth daily. 180 tablet 0   sulfamethoxazole-trimethoprim (BACTRIM DS) 800-160 MG tablet Take 1 tablet by mouth 2 (two) times daily.     VELPHORO 500 MG chewable tablet Chew 1,000 mg by mouth 2 (two) times daily.     Labs: Basic Metabolic Panel: Recent Labs  Lab 08/05/22 1248  NA 135  K 7.0*  CL 100  GLUCOSE 102*  BUN 84*  CREATININE 8.90*   Liver Function Tests: No results for input(s): "AST", "ALT", "ALKPHOS", "BILITOT", "PROT", "ALBUMIN" in the last 168 hours. No results for input(s): "LIPASE", "AMYLASE" in the last 168 hours. No results for input(s): "AMMONIA" in the last 168 hours. CBC: Recent Labs  Lab 08/05/22 1248  HGB 12.9*  HCT 38.0*   Studies/Results: DG Chest Portable 1 View  Result Date: 08/05/2022 CLINICAL DATA:  Weakness.   End-stage renal disease. EXAM: PORTABLE CHEST 1 VIEW COMPARISON:  05/10/2022 FINDINGS: The cardio pericardial silhouette is enlarged. Right IJ central line tip overlies the right atrium. The lungs are clear without focal pneumonia, edema, pneumothorax or pleural effusion. The visualized bony structures of the thorax are unremarkable. Telemetry leads overlie the chest. IMPRESSION: Enlargement of the cardiopericardial silhouette without acute cardiopulmonary findings. Electronically Signed   By: Misty Stanley M.D.   On: 08/05/2022 13:02    Dialysis Orders: Center: [facility]  on TTS. 18-Nre 4 hours 30 min BFR 425 DFR 700 EDW 89kg 2K 2Ca TDC Heparin 8000 unit bolus and 2000 units mid HD Mircera 187mg IV q 2 weeks Venofer '50mg'$  weekly Hectorol 125m IV q HD On korsuva for itching  Velphoro 3 tabs with meals- last phos 12.5.  Assessment/Plan:  Bradycardia/Fatigue: likely due to hyperkalemia. Not on any CCB or BB. Given temporizing measures in the ED and will have emergent HD today (HD unit aware and he is next patient on the list) Hyperkalemia: Likely due to abbreviated HD. Discussed importance of getting full dialysis treatments  ESRD:  On TTS schedule but will have HD today as above  Hypertension/volume: BP soft. Does not appear grossly volume overloaded on exam or CXR. UF goal approx. 1L today to achieve EDW  Anemia: Hgb above goal, no ESA indicated at this time  Metabolic bone disease: Calcium elevated, will hold hectorol for now. Very noncomplaint with binders/diet, last phos was 12.5 which is probably why he has had pruritus. Resume binders here  Nutrition:  Will need renal diet/fluid  restrictions.   Anice Paganini, PA-C 08/05/2022, 1:04 PM  [facility] Pager: [phone]  I have seen and examined this patient and agree with plan and assessment in the above note with renal recommendations/intervention highlighted.  Pt seen in [place] for emergent HD due to symptomatic  hyperkalemia.  Discussed the need to get complete HD sessions (shortened last 2 because he "overslept".   Governor Rooks Markese Bloxham,MD 08/05/2022 2:36 PM

## 2022-08-05 NOTE — Progress Notes (Signed)
Received patient in bed to unit.  Alert and oriented.  Informed consent signed and in chart.   Treatment initiated: 1450 Treatment completed: 1850  Patient tolerated well.  Transported back to the room  Alert, without acute distress.  Hand-off given to patient's nurse.   Access used: Catheter Access issues: none  Total UF removed: 1000 Medication(s) given: heparin bolus Post HD VS: 98.5, 108/68(82), HR-93, RR-23, SP02-97 Post HD weight: 89.4kg   [facility]

## 2022-08-06 ENCOUNTER — Other Ambulatory Visit (HOSPITAL_COMMUNITY): Payer: Medicare Other

## 2022-08-06 ENCOUNTER — Inpatient Hospital Stay (HOSPITAL_COMMUNITY): Payer: Medicare Other

## 2022-08-06 DIAGNOSIS — I70262 Atherosclerosis of native arteries of extremities with gangrene, left leg: Secondary | ICD-10-CM

## 2022-08-06 DIAGNOSIS — E875 Hyperkalemia: Secondary | ICD-10-CM | POA: Diagnosis not present

## 2022-08-06 DIAGNOSIS — I739 Peripheral vascular disease, unspecified: Secondary | ICD-10-CM

## 2022-08-06 DIAGNOSIS — I96 Gangrene, not elsewhere classified: Secondary | ICD-10-CM | POA: Diagnosis not present

## 2022-08-06 LAB — HEPATIC FUNCTION PANEL
ALT: 155 U/L — ABNORMAL HIGH (ref 0–44)
AST: 333 U/L — ABNORMAL HIGH (ref 15–41)
Albumin: 2.7 g/dL — ABNORMAL LOW (ref 3.5–5.0)
Alkaline Phosphatase: 141 U/L — ABNORMAL HIGH (ref 38–126)
Bilirubin, Direct: 0.3 mg/dL — ABNORMAL HIGH (ref 0.0–0.2)
Indirect Bilirubin: 0.8 mg/dL (ref 0.3–0.9)
Total Bilirubin: 1.1 mg/dL (ref 0.3–1.2)
Total Protein: 6.8 g/dL (ref 6.5–8.1)

## 2022-08-06 LAB — COMPREHENSIVE METABOLIC PANEL
ALT: 74 U/L — ABNORMAL HIGH (ref 0–44)
AST: 140 U/L — ABNORMAL HIGH (ref 15–41)
Albumin: 3 g/dL — ABNORMAL LOW (ref 3.5–5.0)
Alkaline Phosphatase: 166 U/L — ABNORMAL HIGH (ref 38–126)
Anion gap: 35 — ABNORMAL HIGH (ref 5–15)
BUN: 71 mg/dL — ABNORMAL HIGH (ref 6–20)
CO2: 12 mmol/L — ABNORMAL LOW (ref 22–32)
Calcium: 10.2 mg/dL (ref 8.9–10.3)
Chloride: 92 mmol/L — ABNORMAL LOW (ref 98–111)
Creatinine, Ser: 8.84 mg/dL — ABNORMAL HIGH (ref 0.61–1.24)
GFR, Estimated: 7 mL/min — ABNORMAL LOW (ref >60)
Glucose, Bld: 105 mg/dL — ABNORMAL HIGH (ref 70–99)
Potassium: 7 mmol/L (ref 3.5–5.1)
Sodium: 139 mmol/L (ref 135–145)
Total Bilirubin: 1 mg/dL (ref 0.3–1.2)
Total Protein: 8.3 g/dL — ABNORMAL HIGH (ref 6.5–8.1)

## 2022-08-06 LAB — CBC
HCT: 29.4 % — ABNORMAL LOW (ref 39.0–52.0)
Hemoglobin: 9.5 g/dL — ABNORMAL LOW (ref 13.0–17.0)
MCH: 30.2 pg (ref 26.0–34.0)
MCHC: 32.3 g/dL (ref 30.0–36.0)
MCV: 93.3 fL (ref 80.0–100.0)
Platelets: 219 10*3/uL (ref 150–400)
RBC: 3.15 MIL/uL — ABNORMAL LOW (ref 4.22–5.81)
RDW: 16.9 % — ABNORMAL HIGH (ref 11.5–15.5)
WBC: 10.5 10*3/uL (ref 4.0–10.5)
nRBC: 0.6 % — ABNORMAL HIGH (ref 0.0–0.2)

## 2022-08-06 LAB — GLUCOSE, CAPILLARY
Glucose-Capillary: 209 mg/dL — ABNORMAL HIGH (ref 70–99)
Glucose-Capillary: 110 mg/dL — ABNORMAL HIGH (ref 70–99)

## 2022-08-06 LAB — BASIC METABOLIC PANEL
Anion gap: 17 — ABNORMAL HIGH (ref 5–15)
BUN: 36 mg/dL — ABNORMAL HIGH (ref 6–20)
CO2: 22 mmol/L (ref 22–32)
Calcium: 8.6 mg/dL — ABNORMAL LOW (ref 8.9–10.3)
Chloride: 94 mmol/L — ABNORMAL LOW (ref 98–111)
Creatinine, Ser: 4.83 mg/dL — ABNORMAL HIGH (ref 0.61–1.24)
GFR, Estimated: 14 mL/min — ABNORMAL LOW (ref >60)
Glucose, Bld: 148 mg/dL — ABNORMAL HIGH (ref 70–99)
Potassium: 3.7 mmol/L (ref 3.5–5.1)
Sodium: 133 mmol/L — ABNORMAL LOW (ref 135–145)

## 2022-08-06 LAB — MRSA NEXT GEN BY PCR, NASAL: MRSA by PCR Next Gen: NOT DETECTED

## 2022-08-06 LAB — HEPATITIS B SURFACE ANTIBODY, QUANTITATIVE: Hep B S AB Quant (Post): 47 m[IU]/mL (ref >9.9)

## 2022-08-06 LAB — LACTIC ACID, PLASMA: Lactic Acid, Venous: 2.8 mmol/L (ref 0.5–1.9)

## 2022-08-06 LAB — PROCALCITONIN: Procalcitonin: 13.54 ng/mL

## 2022-08-06 MED ORDER — METRONIDAZOLE 500 MG/100ML IV SOLN
500.0000 mg | Freq: Two times a day (BID) | INTRAVENOUS | Status: DC
  Administered 2022-08-07 – 2022-08-08 (×3): 500 mg via INTRAVENOUS
  Filled 2022-08-06 (×4): qty 100

## 2022-08-06 MED ORDER — HEPARIN SODIUM (PORCINE) 1000 UNIT/ML IJ SOLN: 8000.0000 [IU] | Freq: Once | INTRAMUSCULAR | Status: AC

## 2022-08-06 MED ORDER — HEPARIN SODIUM (PORCINE) 1000 UNIT/ML IJ SOLN
INTRAMUSCULAR | Status: AC
  Administered 2022-08-06: 8000 [IU] via INTRAVENOUS
  Filled 2022-08-06: qty 8

## 2022-08-06 MED ORDER — ORAL CARE MOUTH RINSE: 15.0000 mL | OROMUCOSAL | Status: DC | PRN

## 2022-08-06 MED ORDER — CEFAZOLIN SODIUM-DEXTROSE 2-4 GM/100ML-% IV SOLN
2.0000 g | INTRAVENOUS | Status: AC
  Administered 2022-08-07: 2 g via INTRAVENOUS
  Filled 2022-08-06: qty 100

## 2022-08-06 MED ORDER — VANCOMYCIN HCL IN DEXTROSE 1-5 GM/200ML-% IV SOLN
1000.0000 mg | INTRAVENOUS | Status: DC
  Filled 2022-08-06: qty 200

## 2022-08-06 MED ORDER — VANCOMYCIN HCL 1750 MG/350ML IV SOLN
1750.0000 mg | Freq: Once | INTRAVENOUS | Status: AC
  Administered 2022-08-06: 1750 mg via INTRAVENOUS
  Filled 2022-08-06: qty 350

## 2022-08-06 MED ORDER — HEPARIN SODIUM (PORCINE) 1000 UNIT/ML IJ SOLN
INTRAMUSCULAR | Status: AC
  Filled 2022-08-06: qty 4

## 2022-08-06 MED ORDER — CHLORHEXIDINE GLUCONATE CLOTH 2 % EX PADS
6.0000 | MEDICATED_PAD | Freq: Every day | CUTANEOUS | Status: DC
  Administered 2022-08-06 – 2022-08-12 (×5): 6 via TOPICAL

## 2022-08-06 MED ORDER — SODIUM CHLORIDE 0.9 % IV SOLN
1.0000 g | INTRAVENOUS | Status: DC
  Administered 2022-08-07: 1 g via INTRAVENOUS
  Filled 2022-08-06 (×3): qty 10

## 2022-08-06 NOTE — Consult Note (Signed)
ORTHOPAEDIC CONSULTATION  REQUESTING PHYSICIAN: Aline August, MD  Chief Complaint: Gangrene left leg.  HPI: Edward [name] is a 51 y.o. male who presents with gangrene of the left leg up to the popliteal fossa.  Patient is status post above-knee amputation on the right and status post multiple digit amputations.  Past Medical History:  Diagnosis Date   Allergy    Anemia    getting Venofer with dialysis   Anxiety    self reported, sometimes-situational anxiety   Arthritis    bilateral hands/LEFT hip   CHF (congestive heart failure) (HCC)    Chronic kidney disease    t th s dialysis- [place] ---ESRD   Coronary artery disease    Depression    self reported, sometimes-situational depression   Diabetes mellitus    type II- on meds   Dyspnea    sometimes- fluid   GERD (gastroesophageal reflux disease)    on meds   History of blood transfusion    Hypertension    no longer on medication since starting on Hemodialysis   Myocardial infarction ([place]) 2019   Peripheral vascular disease ([place])    Sleep apnea    does not use CPAP   Past Surgical History:  Procedure Laterality Date   A/V FISTULAGRAM Left 03/06/2020   Procedure: A/V FISTULAGRAM;  Surgeon: Waynetta Sandy, MD;  Location: [facility];  Service: Cardiovascular;  Laterality: Left;   ABDOMINAL AORTOGRAM N/A 01/07/2022   Procedure: ABDOMINAL AORTOGRAM;  Surgeon: Waynetta Sandy, MD;  Location: [facility];  Service: Cardiovascular;  Laterality: N/A;   ABDOMINAL AORTOGRAM W/LOWER EXTREMITY N/A 04/09/2021   Procedure: ABDOMINAL AORTOGRAM W/LOWER EXTREMITY;  Surgeon: Waynetta Sandy, MD;  Location: [facility];  Service: Cardiovascular;  Laterality: N/A;   AMPUTATION Left 05/10/2016   Procedure: Left Transmetatarsal Amputation;  Surgeon: [name], MD;  Location: [facility];  Service: Orthopedics;  Laterality: Left;   AMPUTATION Right 05/16/2018   Procedure: PARTIAL RAY  AMPUTATION FIFTH TOE RIGHT FOOT;  Surgeon: [name], DPM;  Location: [facility];  Service: Podiatry;  Laterality: Right;   AMPUTATION Right 06/17/2018   Procedure: AMPUTATION 4th RAY Right Foot;  Surgeon: Trula Slade, DPM;  Location: [facility];  Service: Podiatry;  Laterality: Right;   AMPUTATION Right 06/19/2018   Procedure: RIGHT BELOW KNEE AMPUTATION;  Surgeon: [name], MD;  Location: [facility];  Service: Orthopedics;  Laterality: Right;   AMPUTATION Left 10/05/2021   Procedure: LEFT RING FINGER AMPUTATION;  Surgeon: Daryll Brod, MD;  Location: [place];  Service: Orthopedics;  Laterality: Left;  45 MIN   AMPUTATION Left 11/15/2021   Procedure: LEFT INDEX FINGER AMPUTATION;  Surgeon: Leanora Cover, MD;  Location: [place];  Service: Orthopedics;  Laterality: Left;  60 MIN   AMPUTATION Right 01/08/2022   Procedure: AMPUTATION ABOVE KNEE;  Surgeon: Waynetta Sandy, MD;  Location: [facility];  Service: Vascular;  Laterality: Right;   AMPUTATION Bilateral 01/12/2022   Procedure: LEFT INDEX FINGER AMPUTATION, RIGHT INDEX, LONG AND SMALL DIGITS AMPUTATION;  Surgeon: Leanora Cover, MD;  Location: [place];  Service: Orthopedics;  Laterality: Bilateral;   AMPUTATION Right 04/01/2022   Procedure: RIGHT INDEX FINGER AMPUTATION AT METACARPAL PHALANGEAL JOINT, INCISION AND DRAINAGE RIGHT HAND;  Surgeon: Leanora Cover, MD;  Location: [facility];  Service: Orthopedics;  Laterality: Right;  90 MIN   AMPUTATION Right 05/23/2022   Procedure: RIGHT INDEX FINGER RAY AMPUTATION; RIGHT LONG, RING, AND  SMALL FINGER AMPUTATIONS;  Surgeon: Leanora Cover, MD;  Location: [facility];  Service: Orthopedics;  Laterality: Right;  90 MIN   AORTIC ARCH ANGIOGRAPHY N/A 01/07/2022   Procedure: AORTIC ARCH ANGIOGRAPHY;  Surgeon: Waynetta Sandy, MD;  Location: [facility];  Service: Cardiovascular;  Laterality: N/A;   APPLICATION OF WOUND VAC Right 06/19/2018   Procedure: APPLICATION OF WOUND VAC;  Surgeon: [name],  MD;  Location: [facility];  Service: Orthopedics;  Laterality: Right;   AV FISTULA PLACEMENT Left 11/25/2019   Procedure: LEFT ARM Brachiocephalic  ARTERIOVENOUS (AV) FISTULA CREATION;  Surgeon: Rosetta Posner, MD;  Location: [place];  Service: Vascular;  Laterality: Left;   [place] Left 07/19/2020   Procedure: LEFT SECOND STAGE [place];  Surgeon: Waynetta Sandy, MD;  Location: [facility];  Service: Vascular;  Laterality: Left;   CIRCUMCISION  09/02/2011   Procedure: CIRCUMCISION ADULT;  Surgeon: Molli Hazard, MD;  Location: [facility];  Service: Urology;  Laterality: N/A;   CORONARY STENT INTERVENTION N/A 07/29/2018   Procedure: CORONARY STENT INTERVENTION;  Surgeon: Leonie Man, MD;  Location: [facility];  Service: Cardiovascular;  Laterality: N/A;   DEBRIDEMENT AND CLOSURE WOUND Left 11/15/2021   Procedure: LEFT HAND WOUND CLOSURE;  Surgeon: Leanora Cover, MD;  Location: [facility];  Service: Orthopedics;  Laterality: Left;   I & D EXTREMITY  09/02/2011   Procedure: IRRIGATION AND DEBRIDEMENT EXTREMITY;  Surgeon: Theodoro Kos, DO;  Location: [facility];  Service: Plastics;  Laterality: N/A;   INCISION AND DRAINAGE OF WOUND  08/12/2011   Procedure: IRRIGATION AND DEBRIDEMENT WOUND;  Surgeon: Zenovia Jarred, MD;  Location: [place];  Service: General;;  perineum   INSERTION OF DIALYSIS CATHETER N/A 11/25/2019   Procedure: INSERTION OF Righ Internal Jugular DIALYSIS CATHETER;  Surgeon: Rosetta Posner, MD;  Location: [facility];  Service: Vascular;  Laterality: N/A;   [place]  08/12/2011   Procedure: IRRIGATION AND DEBRIDEMENT ABSCESS;  Surgeon: Molli Hazard, MD;  Location: [facility];  Service: Urology;  Laterality: N/A;  irrigation and debridment perineum     IRRIGATION AND DEBRIDEMENT ABSCESS  08/14/2011   Procedure: MINOR INCISION AND DRAINAGE OF ABSCESS;  Surgeon: Molli Hazard, MD;  Location: [place] [facility];  Service: Urology;  Laterality: N/A;  Perineal Wound Debridement;Placement Bilateral Testicular Thigh Pouches   LEFT HEART CATH AND CORONARY ANGIOGRAPHY N/A 07/29/2018   Procedure: LEFT HEART CATH AND CORONARY ANGIOGRAPHY;  Surgeon: Leonie Man, MD;  Location: [facility];  Service: Cardiovascular;  Laterality: N/A;   LOWER EXTREMITY ANGIOGRAPHY Bilateral 01/07/2022   Procedure: Lower Extremity Angiography;  Surgeon: Waynetta Sandy, MD;  Location: [facility];  Service: Cardiovascular;  Laterality: Bilateral;   PERIPHERAL VASCULAR ATHERECTOMY Left 04/09/2021   Procedure: PERIPHERAL VASCULAR ATHERECTOMY;  Surgeon: Waynetta Sandy, MD;  Location: [facility];  Service: Cardiovascular;  Laterality: Left;  TP trunk and peroneal arteries   PERIPHERAL VASCULAR BALLOON ANGIOPLASTY Left 04/09/2021   Procedure: PERIPHERAL VASCULAR BALLOON ANGIOPLASTY;  Surgeon: Waynetta Sandy, MD;  Location: [facility];  Service: Cardiovascular;  Laterality: Left;  Popliteal   REVISON OF ARTERIOVENOUS FISTULA Left 05/24/2020   Procedure: LEFT ARM ARTERIOVENOUS FISTULA  CONVERSION TO BASILIC VEIN FISTULA;  Surgeon: Waynetta Sandy, MD;  Location: [facility];  Service: Vascular;  Laterality: Left;   TOTAL HIP ARTHROPLASTY Left 10/11/2016  Procedure: LEFT TOTAL HIP ARTHROPLASTY ANTERIOR APPROACH;  Surgeon: Mcarthur Rossetti, MD;  Location: [facility];  Service: Orthopedics;  Laterality: Left;   UPPER EXTREMITY ANGIOGRAPHY Bilateral 01/07/2022   Procedure: Upper Extremity Angiography;  Surgeon: Waynetta Sandy, MD;  Location: [facility];  Service: Cardiovascular;  Laterality: Bilateral;   Social History   Socioeconomic History   Marital status: Widowed    Spouse name: Not on file   Number of children: Not on file   Years of education: Not on file   Highest education level: Not on file  Occupational  History   Occupation: Disabled  Tobacco Use   Smoking status: Former    Packs/day: 0.25    Years: 24.00    Total pack years: 6.00    Types: Cigarettes    Quit date: 11/19/2019    Years since quitting: 2.7   Smokeless tobacco: Never  Vaping Use   Vaping Use: Never used  Substance and Sexual Activity   Alcohol use: Not Currently    Alcohol/week: 0.0 standard drinks of alcohol    Comment: quit 2018   Drug use: No   Sexual activity: Not Currently    Birth control/protection: Condom  Other Topics Concern   Not on file  Social History Narrative   Lives w/ his 2 sons.   Social Determinants of Health   Financial Resource Strain: Not on file  Food Insecurity: Not on file  Transportation Needs: Not on file  Physical Activity: Not on file  Stress: Not on file  Social Connections: Not on file   Family History  Problem Relation Age of Onset   Diabetes Other    Pancreatic cancer Mother 76   Colon cancer Neg Hx    Colon polyps Neg Hx    Esophageal cancer Neg Hx    Rectal cancer Neg Hx    Stomach cancer Neg Hx    - negative except otherwise stated in the family history section Allergies  Allergen Reactions   Crestor [Rosuvastatin] Rash    Eye redness   Dilaudid [Hydromorphone] Itching   Robaxin [Methocarbamol] Other (See Comments)    Pt states medication made his arm pain worse.   Benadryl [Diphenhydramine] Rash   Prior to Admission medications   Medication Sig Start Date End Date Taking? Authorizing Provider  acetaminophen (TYLENOL) 500 MG tablet Take 1,000 mg by mouth Every Tuesday,Thursday,and Saturday with dialysis.    [provider]  ALPRAZolam Duanne Moron) 0.25 MG tablet Take 0.25 mg by mouth daily. 04/29/22   [provider]  aspirin EC 81 MG tablet Take 81 mg by mouth daily. Swallow whole.    [provider]  atorvastatin (LIPITOR) 80 MG tablet TAKE ONE TABLET BY MOUTH ONCE DAILY 08/01/22   Lelon Perla, MD  bismuth subsalicylate (PEPTO  BISMOL) 262 MG/15ML suspension Take 30 mLs by mouth every 6 (six) hours as needed for indigestion.    [provider]  clopidogrel (PLAVIX) 75 MG tablet TAKE ONE TABLET BY MOUTH ONCE DAILY Patient taking differently: Take 75 mg by mouth daily. 02/01/22   Waynetta Sandy, MD  ezetimibe (ZETIA) 10 MG tablet Take 1 tablet (10 mg total) by mouth daily. Patient not taking: Reported on 05/11/2022 01/17/22   Edwin Dada, MD  gabapentin (NEURONTIN) 100 MG capsule Take 100 mg by mouth 3 (three) times daily. 12/03/21   [provider]  HUMALOG MIX 75/25 KWIKPEN (75-25) 100 UNIT/ML KwikPen Inject 30 Units into the skin daily. 05/04/22  [provider]  insulin aspart protamine - aspart (NOVOLOG MIX 70/30 FLEXPEN) (70-30) 100 UNIT/ML FlexPen Inject 0.3 mLs (30 Units total) into the skin 2 (two) times daily with a meal. Patient taking differently: Inject 0-30 Units into the skin 2 (two) times daily as needed. 09/29/20   Lavina Hamman, MD  Insulin Pen Needle 31G X 5 MM MISC Use daily to inject insulin as instructed 07/01/18   Angiulli, Lavon Paganini, PA-C  linaclotide [facility]) 72 MCG capsule Take 1 capsule (72 mcg total) by mouth daily before breakfast. Patient taking differently: Take 72 mcg by mouth in the morning. 12/13/20   Armbruster, Carlota Raspberry, MD  oxyCODONE (ROXICODONE) 5 MG immediate release tablet 1-2 tabs PO q6 hours prn pain 05/23/22   Leanora Cover, MD  pantoprazole (PROTONIX) 40 MG tablet Take 1 tablet (40 mg total) by mouth daily. 08/01/22   Willia Craze, NP  sulfamethoxazole-trimethoprim (BACTRIM DS) 800-160 MG tablet Take 1 tablet by mouth 2 (two) times daily. 05/17/22   [provider]  VELPHORO 500 MG chewable tablet Chew 1,000 mg by mouth 2 (two) times daily. 06/14/20   [provider]   DG Chest Portable 1 View  Result Date: 08/05/2022 CLINICAL DATA:  Weakness.  End-stage renal disease. EXAM: PORTABLE CHEST 1 VIEW COMPARISON:   05/10/2022 FINDINGS: The cardio pericardial silhouette is enlarged. Right IJ central line tip overlies the right atrium. The lungs are clear without focal pneumonia, edema, pneumothorax or pleural effusion. The visualized bony structures of the thorax are unremarkable. Telemetry leads overlie the chest. IMPRESSION: Enlargement of the cardiopericardial silhouette without acute cardiopulmonary findings. Electronically Signed   By: Misty Stanley M.D.   On: 08/05/2022 13:02   - pertinent xrays, CT, MRI studies were reviewed and independently interpreted  Positive ROS: All other systems have been reviewed and were otherwise negative with the exception of those mentioned in the HPI and as above.  Physical Exam: General: Alert, no acute distress Psychiatric: Patient is competent for consent with normal mood and affect Lymphatic: No axillary or cervical lymphadenopathy Cardiovascular: No pedal edema Respiratory: No cyanosis, no use of accessory musculature GI: No organomegaly, abdomen is soft and non-tender    Images:  '@ENCIMAGES'$ @  Labs:  Lab Results  Component Value Date   HGBA1C 6.3 (H) 05/10/2022   HGBA1C 6.5 (H) 01/07/2022   HGBA1C 8.6 (H) 10/05/2021   ESRSEDRATE 128 (H) 06/15/2018   ESRSEDRATE 47 (H) 05/07/2016   CRP 21.3 (H) 01/12/2022   CRP 27.8 (H) 01/11/2022   CRP 25.5 (H) 01/10/2022   REPTSTATUS PENDING 08/05/2022   REPTSTATUS PENDING 08/05/2022   GRAMSTAIN  05/23/2022    RARE WBC PRESENT,BOTH PMN AND MONONUCLEAR RARE GRAM POSITIVE COCCI IN PAIRS    CULT  08/05/2022    NO GROWTH < 12 HOURS Performed at [facility], [place] [address], [place], [place] [address]    CULT  08/05/2022    NO GROWTH < 12 HOURS Performed at [facility] [address] [place], [place] [address]    [place] 05/23/2022   LABORGA STAPHYLOCOCCUS EPIDERMIDIS 05/23/2022    Lab Results  Component Value Date   ALBUMIN 2.7 (L) 08/06/2022   ALBUMIN 3.0 (L)  08/05/2022   ALBUMIN 3.2 (L) 05/11/2022        Latest Ref Rng & Units 08/05/2022   12:48 PM 08/05/2022   12:40 PM 05/23/2022   12:49 PM  CBC EXTENDED  WBC 4.0 - 10.5 K/uL  12.0    RBC 4.22 - 5.81 MIL/uL  3.70    Hemoglobin 13.0 - 17.0 g/dL 12.9  11.1  12.9   HCT 39.0 - 52.0 % 38.0  36.3  38.0   Platelets 150 - 400 K/uL  270    NEUT# 1.7 - 7.7 K/uL  9.7    Lymph# 0.7 - 4.0 K/uL  1.1      Neurologic: Patient does not have protective sensation bilateral lower extremities.   MUSCULOSKELETAL:   Skin: Examination patient has black ischemic gangrenous changes of the left foot heel and calf extending up to the popliteal fossa.  Proximal to the patella there is swelling.  Patient is a stable right above-knee amputation and status post multiple digit amputations.  Hemoglobin 12.9 albumin 2.7 with a hemoglobin A1c of 6.3.  Assessment: Assessment: Severe peripheral vascular disease with gangrenous changes of the entire left leg from the popliteal fossa distally.  Plan: Discussed with the patient the recommended treatment would be an above-the-knee amputation left similar to the right.  Discussed that we could proceed with surgery tomorrow afternoon.  I will discuss patient's case with vascular surgery to see if any endovascular options are available.  Thank you for the consult and the opportunity to see Mr. Aleen Sells, MD [facility] [phone] 7:27 AM

## 2022-08-06 NOTE — Progress Notes (Signed)
PROGRESS NOTE    Edward [name]  UXL:[id number] DOB: 27-Jan-1971 DOA: 08/05/2022 PCP: [name], MD   Brief Narrative:  51 y.o. male with medical history significant of ESRD on HD, PVD status post multiple amputations including right AKA, and multiple fingers amputation, and left transmetatarsal amputation, with chronic left leg and foot and left shin wound, IDDM, combined chronic combined CHF with LVEF 45%, chronic orthostatic hypotension, recurrent hyperkalemia, presented with worsening nausea, vomiting and dizziness.  On presentation, he was bradycardic and hypotensive: Blood pressure responded to a small IV bolus of 100 ml normal saline.  Potassium of 7, creatinine of 8.8.  EKG showed sinus bradycardia with T wave.  He was given Lokelma, D50 and insulin and underwent emergent hemodialysis.  Vascular surgery and orthopedic surgery were consulted.  Assessment & Plan:   Hyperkalemia End-stage renal disease on hemodialysis Acute metabolic acidosis -Presented with potassium of 7.  Required oral Lokelma, D50 and insulin and underwent emergent hemodialysis.  Dialysis as per nephrology schedule.  Potassium 3.7 this morning -Acidosis has resolved   SIRS Chronic left lower extremity nonhealing ulcers and left heel dry gangrene PVD with history of multiple amputations including AKA, multiple fingers amputation and left transmetatarsal amputation -Orthopedics and vascular surgery following.  Vascular surgery was overall the opinion that patient does not have any revascularization options and recommended AKA.  Dr. Sharol Given following and planning for AKA tomorrow.  Patient wants to speak to Dr. Servando Snare. -Procalcitonin elevated.  I will empirically start the patient on antibiotics  Lactic acidosis -Improving  Anemia of chronic disease -Possibly from renal failure.  Hemoglobin stable.  Monitor intermittently  Leukocytosis -Repeat a.m. labs  Hyponatremia -Mild.  Managed by nephrology by  dialysis  Acute transaminitis -Worsening.  Repeat a.m. labs.  Check right upper quadrant ultrasound.  Check hepatitis panel  CAD Chronic combined heart failure -No chest pain.  Volume managed by dialysis.  Diabetes mellitus type 2 with hyperglycemia -Monitor blood sugars.  Continue linagliptin.  CBGs with SSI.  Goals of care -Palliative care consultation for goals of care discussion  DVT prophylaxis: Heparin subcutaneous Code Status: Full Family Communication: None at bedside Disposition Plan: Status is: Inpatient Remains inpatient appropriate because: Of severity of illness  Consultants: Nephrology/vascular surgery/orthopedics  Procedures: None  Antimicrobials: None   Subjective: Patient seen and examined at bedside.  Not sure about amputation, wants to talk to Dr. Donzetta Matters.  No overnight fever, vomiting, chest pain reported.  Objective: Vitals:   08/05/22 2003 08/05/22 2313 08/06/22 0427 08/06/22 0730  BP: 120/71 111/68 120/82 114/71  Pulse:    70  Resp: (!) '21 19 20   '$ Temp: 98.3 F (36.8 C) 98.4 F (36.9 C) 98.2 F (36.8 C) 98 F (36.7 C)  TempSrc: Oral Oral Oral Oral  SpO2:   95% 91%  Weight:      Height:        Intake/Output Summary (Last 24 hours) at 08/06/2022 1109 Last data filed at 08/06/2022 0630 Gross per 24 hour  Intake 470.19 ml  Output 1000 ml  Net -529.81 ml   Filed Weights   08/05/22 1135 08/05/22 1444 08/05/22 1947  Weight: 90.3 kg 90.4 kg 89.4 kg    Examination:  General exam: Appears calm and comfortable.  Looks chronically ill and deconditioned.  Currently on room air. Respiratory system: Bilateral decreased breath sounds at bases with some scattered crackles and intermittent tachypnea Cardiovascular system: S1 & S2 heard, Rate controlled Gastrointestinal system: Abdomen is nondistended, soft and  nontender. Normal bowel sounds heard. Extremities: Right AKA, left lower extremity dressing present Central nervous system: Alert and  oriented.  Slow to respond.  Poor historian.  No focal neurological deficits. Moving extremities Skin: No other ecchymosis/lesions Psychiatry: Flat affect.  Not agitated.    Data Reviewed: I have personally reviewed following labs and imaging studies  CBC: Recent Labs  Lab 08/05/22 1240 08/05/22 1248  WBC 12.0*  --   NEUTROABS 9.7*  --   HGB 11.1* 12.9*  HCT 36.3* 38.0*  MCV 98.1  --   PLT 270  --    Basic Metabolic Panel: Recent Labs  Lab 08/05/22 1240 08/05/22 1248 08/06/22 0344  NA 139 135 133*  K 7.0* 7.0* 3.7  CL 92* 100 94*  CO2 12*  --  22  GLUCOSE 105* 102* 148*  BUN 71* 84* 36*  CREATININE 8.84* 8.90* 4.83*  CALCIUM 10.2  --  8.6*  MG 2.8*  --   --    GFR: Estimated Creatinine Clearance: 20.2 mL/min (A) (by C-G formula based on SCr of 4.83 mg/dL (H)). Liver Function Tests: Recent Labs  Lab 08/05/22 1240 08/06/22 0344  AST 140* 333*  ALT 74* 155*  ALKPHOS 166* 141*  BILITOT 1.0 1.1  PROT 8.3* 6.8  ALBUMIN 3.0* 2.7*   Recent Labs  Lab 08/05/22 1240  LIPASE 24   No results for input(s): "AMMONIA" in the last 168 hours. Coagulation Profile: No results for input(s): "INR", "PROTIME" in the last 168 hours. Cardiac Enzymes: Recent Labs  Lab 08/05/22 1318  CKTOTAL 221   BNP (last 3 results) No results for input(s): "PROBNP" in the last 8760 hours. HbA1C: No results for input(s): "HGBA1C" in the last 72 hours. CBG: Recent Labs  Lab 08/05/22 2002 08/06/22 0918  GLUCAP 135* 209*   Lipid Profile: No results for input(s): "CHOL", "HDL", "LDLCALC", "TRIG", "CHOLHDL", "LDLDIRECT" in the last 72 hours. Thyroid Function Tests: No results for input(s): "TSH", "T4TOTAL", "FREET4", "T3FREE", "THYROIDAB" in the last 72 hours. Anemia Panel: No results for input(s): "VITAMINB12", "FOLATE", "FERRITIN", "TIBC", "IRON", "RETICCTPCT" in the last 72 hours. Sepsis Labs: Recent Labs  Lab 08/05/22 2105 08/06/22 0344  PROCALCITON 11.58 13.54   LATICACIDVEN 7.1* 2.8*    Recent Results (from the past 240 hour(s))  Resp panel by RT-PCR (RSV, Flu A&B, Covid) Anterior Nasal Swab     Status: None   Collection Time: 08/05/22 12:39 PM   Specimen: Anterior Nasal Swab  Result Value Ref Range Status   SARS Coronavirus 2 by RT PCR NEGATIVE NEGATIVE Final    Comment: (NOTE) SARS-CoV-2 target nucleic acids are NOT DETECTED.  The SARS-CoV-2 RNA is generally detectable in upper respiratory specimens during the acute phase of infection. The lowest concentration of SARS-CoV-2 viral copies this assay can detect is 138 copies/mL. A negative result does not preclude SARS-Cov-2 infection and should not be used as the sole basis for treatment or other patient management decisions. A negative result may occur with  improper specimen collection/handling, submission of specimen other than nasopharyngeal swab, presence of viral mutation(s) within the areas targeted by this assay, and inadequate number of viral copies(<138 copies/mL). A negative result must be combined with clinical observations, patient history, and epidemiological information. The expected result is Negative.  Fact Sheet for Patients:  EntrepreneurPulse.com.au  Fact Sheet for Healthcare Providers:  IncredibleEmployment.be  This test is no t yet approved or cleared by the [place] FDA and  has been authorized for detection and/or diagnosis of  SARS-CoV-2 by FDA under an Emergency Use Authorization (EUA). This EUA will remain  in effect (meaning this test can be used) for the duration of the COVID-19 declaration under Section 564(b)(1) of the Act, 21 U.S.C.section 360bbb-3(b)(1), unless the authorization is terminated  or revoked sooner.       Influenza A by PCR NEGATIVE NEGATIVE Final   Influenza B by PCR NEGATIVE NEGATIVE Final    Comment: (NOTE) The Xpert Xpress SARS-CoV-2/FLU/RSV plus assay is intended as an aid in the  diagnosis of influenza from Nasopharyngeal swab specimens and should not be used as a sole basis for treatment. Nasal washings and aspirates are unacceptable for Xpert Xpress SARS-CoV-2/FLU/RSV testing.  Fact Sheet for Patients: EntrepreneurPulse.com.au  Fact Sheet for Healthcare Providers: IncredibleEmployment.be  This test is not yet approved or cleared by the [place] FDA and has been authorized for detection and/or diagnosis of SARS-CoV-2 by FDA under an Emergency Use Authorization (EUA). This EUA will remain in effect (meaning this test can be used) for the duration of the COVID-19 declaration under Section 564(b)(1) of the Act, 21 U.S.C. section 360bbb-3(b)(1), unless the authorization is terminated or revoked.     Resp Syncytial Virus by PCR NEGATIVE NEGATIVE Final    Comment: (NOTE) Fact Sheet for Patients: EntrepreneurPulse.com.au  Fact Sheet for Healthcare Providers: IncredibleEmployment.be  This test is not yet approved or cleared by the [place] FDA and has been authorized for detection and/or diagnosis of SARS-CoV-2 by FDA under an Emergency Use Authorization (EUA). This EUA will remain in effect (meaning this test can be used) for the duration of the COVID-19 declaration under Section 564(b)(1) of the Act, 21 U.S.C. section 360bbb-3(b)(1), unless the authorization is terminated or revoked.  Performed at [facility], [place] [address] [place], [place] [address]   Culture, blood (Routine X 2) w Reflex to ID Panel     Status: None (Preliminary result)   Collection Time: 08/05/22  9:05 PM   Specimen: BLOOD RIGHT ARM  Result Value Ref Range Status   Specimen Description BLOOD RIGHT ARM  Final   Special Requests   Final    BOTTLES DRAWN AEROBIC AND ANAEROBIC Blood Culture adequate volume   Culture   Final    NO GROWTH < 12 HOURS Performed at [facility], [place] [address], [place], [place] [address]    Report Status PENDING  Incomplete  Culture, blood (Routine X 2) w Reflex to ID Panel     Status: None (Preliminary result)   Collection Time: 08/05/22  9:05 PM   Specimen: BLOOD RIGHT ARM  Result Value Ref Range Status   Specimen Description BLOOD RIGHT ARM  Final   Special Requests   Final    BOTTLES DRAWN AEROBIC AND ANAEROBIC Blood Culture adequate volume   Culture   Final    NO GROWTH < 12 HOURS Performed at [facility], [place] [address], [place], [place] [address]    Report Status PENDING  Incomplete  MRSA Next Gen by PCR, Nasal     Status: None   Collection Time: 08/06/22 12:50 AM   Specimen: Nasal Mucosa; Nasal Swab  Result Value Ref Range Status   MRSA by PCR Next Gen NOT DETECTED NOT DETECTED Final    Comment: (NOTE) The GeneXpert MRSA Assay (FDA approved for NASAL specimens only), is one component of a comprehensive MRSA colonization surveillance program. It is not intended to diagnose MRSA infection nor to guide or monitor treatment for  MRSA infections. Test performance is not FDA approved in patients less than 93 years old. Performed at [facility], [place] [address], [place], [place] [address]          Radiology Studies: DG Chest Portable 1 View  Result Date: 08/05/2022 CLINICAL DATA:  Weakness.  End-stage renal disease. EXAM: PORTABLE CHEST 1 VIEW COMPARISON:  05/10/2022 FINDINGS: The cardio pericardial silhouette is enlarged. Right IJ central line tip overlies the right atrium. The lungs are clear without focal pneumonia, edema, pneumothorax or pleural effusion. The visualized bony structures of the thorax are unremarkable. Telemetry leads overlie the chest. IMPRESSION: Enlargement of the cardiopericardial silhouette without acute cardiopulmonary findings. Electronically Signed   By: Misty Stanley M.D.   On: 08/05/2022 13:02        Scheduled Meds:  acetaminophen  1,000 mg Oral Q T,Th,Sa-HD   ALPRAZolam  0.25 mg Oral  Daily   aspirin EC  81 mg Oral Daily   Chlorhexidine Gluconate Cloth  6 each Topical Q0600   Chlorhexidine Gluconate Cloth  6 each Topical Q0600   Chlorhexidine Gluconate Cloth  6 each Topical Q0600   clopidogrel  75 mg Oral Daily   gabapentin  100 mg Oral TID   heparin  5,000 Units Subcutaneous Q12H   insulin aspart  0-9 Units Subcutaneous TID WC   linaclotide  72 mcg Oral q AM   linagliptin  5 mg Oral Daily   pantoprazole  40 mg Oral Daily   sodium bicarbonate  650 mg Oral BID   sucroferric oxyhydroxide  1,000 mg Oral BID WC   Continuous Infusions:        Aline August, MD [facility] 08/06/2022, 11:09 AM

## 2022-08-06 NOTE — Progress Notes (Signed)
  Progress Note    08/06/2022 5:23 PM * No surgery date entered *  Subjective: Patient currently on dialysis  Vitals:   08/06/22 1630 08/06/22 1700  BP: 109/72 114/75  Pulse: 80 (!) 160  Resp: 20 12  Temp:    SpO2: 94% 98%    Physical Exam: On dialysis Awake and alert Left leg dressing in place clean and dry and intact  CBC    Component Value Date/Time   WBC 10.5 08/06/2022 0344   RBC 3.15 (L) 08/06/2022 0344   HGB 9.5 (L) 08/06/2022 0344   HCT 29.4 (L) 08/06/2022 0344   PLT 219 08/06/2022 0344   MCV 93.3 08/06/2022 0344   MCH 30.2 08/06/2022 0344   MCHC 32.3 08/06/2022 0344   RDW 16.9 (H) 08/06/2022 0344   LYMPHSABS 1.1 08/05/2022 1240   MONOABS 1.0 08/05/2022 1240   EOSABS 0.0 08/05/2022 1240   BASOSABS 0.0 08/05/2022 1240    BMET    Component Value Date/Time   NA 133 (L) 08/06/2022 0344   NA 140 04/30/2019 1507   K 3.7 08/06/2022 0344   CL 94 (L) 08/06/2022 0344   CO2 22 08/06/2022 0344   GLUCOSE 148 (H) 08/06/2022 0344   BUN 36 (H) 08/06/2022 0344   BUN 37 (H) 04/30/2019 1507   CREATININE 4.83 (H) 08/06/2022 0344   CREATININE 1.70 (H) 06/15/2018 1458   CALCIUM 8.6 (L) 08/06/2022 0344   GFRNONAA 14 (L) 08/06/2022 0344   GFRAA 22 (L) 11/26/2019 1151    INR    Component Value Date/Time   INR 1.5 (H) 05/10/2022 1023     Intake/Output Summary (Last 24 hours) at 08/06/2022 1723 Last data filed at 08/06/2022 0630 Gross per 24 hour  Intake 470.19 ml  Output 1000 ml  Net -529.81 ml     Assessment/plan:  51 y.o. male with previous right above-knee amputation now with extensive wounds of the left lower extremity.  Currently has a dressing but I reviewed the imaging in the chart.  Does not have any revascularization options at this time and I discussed this with him and that he would be best served with above-knee amputation and he is in agreement although states that he is not currently ready to proceed.  Dr. Sharol Given could proceed with above-knee  amputation as early as tomorrow and I have encouraged him to consider this given that the leg really has no functional status or ability for recovery.  Vascular surgery will be available as needed.    Edward Schwartz C. Donzetta Matters, MD [facility] Office: [phone] Pager: [phone]  08/06/2022 5:23 PM

## 2022-08-06 NOTE — Progress Notes (Signed)
Pharmacy Antibiotic Note  Edward [name] is a 51 y.o. male admitted on 08/05/2022 with nausea and vomiting. Patient with chronic L lower extremity nonhealing ulcers and L heel dry gangrene. Orthopedics and VVS following with plans for AKA. Patient is ESRD on HD TTS. Pharmacy has been consulted for vancomycin and cefepime dosing.  Plan: Vancomycin '1750mg'$  IV today, followed by Vancomycin '1000mg'$  IV qHD (TTS) Cefepime 1g IV q24 hours (to be given in the evenings after HD) Watch HD schedule and dose additional abx as necessary F/u abx plans post-op  Height: '6\' 1"'$  (185.4 cm) Weight: 89.4 kg (197 lb 1.5 oz) IBW/kg (Calculated) : 79.9  Temp (24hrs), Avg:98.2 F (36.8 C), Min:97.5 F (36.4 C), Max:98.5 F (36.9 C)  Recent Labs  Lab 08/05/22 1240 08/05/22 1248 08/05/22 2105 08/06/22 0344  WBC 12.0*  --   --   --   CREATININE 8.84* 8.90*  --  4.83*  LATICACIDVEN  --   --  7.1* 2.8*    Estimated Creatinine Clearance: 20.2 mL/min (A) (by C-G formula based on SCr of 4.83 mg/dL (H)).    Allergies  Allergen Reactions   Crestor [Rosuvastatin] Rash    Eye redness   Dilaudid [Hydromorphone] Itching   Robaxin [Methocarbamol] Other (See Comments)    Pt states medication made his arm pain worse.   Benadryl [Diphenhydramine] Rash    Antimicrobials this admission: Vancomycin 12/19 >>  Cefepime 12/19 >>   Dose adjustments this admission:  Microbiology results: 12/18 BCx: ngtd 12/19 MRSA PCR: not detected  Thank you for allowing pharmacy to be a part of this patient's care.  Dimple Nanas, PharmD, BCPS 08/06/2022 12:08 PM

## 2022-08-06 NOTE — Consult Note (Addendum)
[facility] Nurse Consult Note: Reason for Consult:Extensive wounds to LLE in the presence of known PAD.Seen vy Dr. Unk Lightning (VVS) who believes the wound are too significant to heal and recommends amputation. Patient has exhausted revascularization options.Dr. Unk Lightning has asked Dr. Sharol Given (Orthopedics) to evaluate. Wound type: PAD Pressure Injury POA: N/A  There is currently no role for WOC nursing.  [place] nursing team will not follow, but will remain available to this patient, the nursing and medical teams.  Please re-consult if needed.  Thank you for inviting [place] to participate in this patient's Plan of Care.  Maudie Flakes, MSN, RN, CNS, [place], Serita Grammes, [employer], [employer] phone:  [phone]

## 2022-08-06 NOTE — H&P (View-Only) (Signed)
ORTHOPAEDIC CONSULTATION  REQUESTING PHYSICIAN: Aline August, MD  Chief Complaint: Gangrene left leg.  HPI: Edward [name] is a 51 y.o. male who presents with gangrene of the left leg up to the popliteal fossa.  Patient is status post above-knee amputation on the right and status post multiple digit amputations.  Past Medical History:  Diagnosis Date   Allergy    Anemia    getting Venofer with dialysis   Anxiety    self reported, sometimes-situational anxiety   Arthritis    bilateral hands/LEFT hip   CHF (congestive heart failure) (HCC)    Chronic kidney disease    t th s dialysis- [place] ---ESRD   Coronary artery disease    Depression    self reported, sometimes-situational depression   Diabetes mellitus    type II- on meds   Dyspnea    sometimes- fluid   GERD (gastroesophageal reflux disease)    on meds   History of blood transfusion    Hypertension    no longer on medication since starting on Hemodialysis   Myocardial infarction ([place]) 2019   Peripheral vascular disease ([place])    Sleep apnea    does not use CPAP   Past Surgical History:  Procedure Laterality Date   A/V FISTULAGRAM Left 03/06/2020   Procedure: A/V FISTULAGRAM;  Surgeon: Waynetta Sandy, MD;  Location: [facility];  Service: Cardiovascular;  Laterality: Left;   ABDOMINAL AORTOGRAM N/A 01/07/2022   Procedure: ABDOMINAL AORTOGRAM;  Surgeon: Waynetta Sandy, MD;  Location: [facility];  Service: Cardiovascular;  Laterality: N/A;   ABDOMINAL AORTOGRAM W/LOWER EXTREMITY N/A 04/09/2021   Procedure: ABDOMINAL AORTOGRAM W/LOWER EXTREMITY;  Surgeon: Waynetta Sandy, MD;  Location: [facility];  Service: Cardiovascular;  Laterality: N/A;   AMPUTATION Left 05/10/2016   Procedure: Left Transmetatarsal Amputation;  Surgeon: [name], MD;  Location: [facility];  Service: Orthopedics;  Laterality: Left;   AMPUTATION Right 05/16/2018   Procedure: PARTIAL RAY  AMPUTATION FIFTH TOE RIGHT FOOT;  Surgeon: [name], DPM;  Location: [facility];  Service: Podiatry;  Laterality: Right;   AMPUTATION Right 06/17/2018   Procedure: AMPUTATION 4th RAY Right Foot;  Surgeon: Trula Slade, DPM;  Location: [facility];  Service: Podiatry;  Laterality: Right;   AMPUTATION Right 06/19/2018   Procedure: RIGHT BELOW KNEE AMPUTATION;  Surgeon: [name], MD;  Location: [place];  Service: Orthopedics;  Laterality: Right;   AMPUTATION Left 10/05/2021   Procedure: LEFT RING FINGER AMPUTATION;  Surgeon: Daryll Brod, MD;  Location: [facility];  Service: Orthopedics;  Laterality: Left;  45 MIN   AMPUTATION Left 11/15/2021   Procedure: LEFT INDEX FINGER AMPUTATION;  Surgeon: Leanora Cover, MD;  Location: [facility];  Service: Orthopedics;  Laterality: Left;  60 MIN   AMPUTATION Right 01/08/2022   Procedure: AMPUTATION ABOVE KNEE;  Surgeon: Waynetta Sandy, MD;  Location: [facility];  Service: Vascular;  Laterality: Right;   AMPUTATION Bilateral 01/12/2022   Procedure: LEFT INDEX FINGER AMPUTATION, RIGHT INDEX, LONG AND SMALL DIGITS AMPUTATION;  Surgeon: Leanora Cover, MD;  Location: [facility];  Service: Orthopedics;  Laterality: Bilateral;   AMPUTATION Right 04/01/2022   Procedure: RIGHT INDEX FINGER AMPUTATION AT METACARPAL PHALANGEAL JOINT, INCISION AND DRAINAGE RIGHT HAND;  Surgeon: Leanora Cover, MD;  Location: [place];  Service: Orthopedics;  Laterality: Right;  90 MIN   AMPUTATION Right 05/23/2022   Procedure: RIGHT INDEX FINGER RAY AMPUTATION; RIGHT LONG, RING, AND  SMALL FINGER AMPUTATIONS;  Surgeon: Leanora Cover, MD;  Location: [place];  Service: Orthopedics;  Laterality: Right;  90 MIN   AORTIC ARCH ANGIOGRAPHY N/A 01/07/2022   Procedure: AORTIC ARCH ANGIOGRAPHY;  Surgeon: Waynetta Sandy, MD;  Location: [facility];  Service: Cardiovascular;  Laterality: N/A;   APPLICATION OF WOUND VAC Right 06/19/2018   Procedure: APPLICATION OF WOUND VAC;  Surgeon: [name],  MD;  Location: [facility];  Service: Orthopedics;  Laterality: Right;   AV FISTULA PLACEMENT Left 11/25/2019   Procedure: LEFT ARM Brachiocephalic  ARTERIOVENOUS (AV) FISTULA CREATION;  Surgeon: Rosetta Posner, MD;  Location: [facility];  Service: Vascular;  Laterality: Left;   [place] Left 07/19/2020   Procedure: LEFT SECOND STAGE [place];  Surgeon: Waynetta Sandy, MD;  Location: [facility];  Service: Vascular;  Laterality: Left;   CIRCUMCISION  09/02/2011   Procedure: CIRCUMCISION ADULT;  Surgeon: Molli Hazard, MD;  Location: [facility];  Service: Urology;  Laterality: N/A;   CORONARY STENT INTERVENTION N/A 07/29/2018   Procedure: CORONARY STENT INTERVENTION;  Surgeon: Leonie Man, MD;  Location: [facility];  Service: Cardiovascular;  Laterality: N/A;   DEBRIDEMENT AND CLOSURE WOUND Left 11/15/2021   Procedure: LEFT HAND WOUND CLOSURE;  Surgeon: Leanora Cover, MD;  Location: [facility];  Service: Orthopedics;  Laterality: Left;   I & D EXTREMITY  09/02/2011   Procedure: IRRIGATION AND DEBRIDEMENT EXTREMITY;  Surgeon: Theodoro Kos, DO;  Location: [facility];  Service: Plastics;  Laterality: N/A;   INCISION AND DRAINAGE OF WOUND  08/12/2011   Procedure: IRRIGATION AND DEBRIDEMENT WOUND;  Surgeon: Zenovia Jarred, MD;  Location: [facility];  Service: General;;  perineum   INSERTION OF DIALYSIS CATHETER N/A 11/25/2019   Procedure: INSERTION OF Righ Internal Jugular DIALYSIS CATHETER;  Surgeon: Rosetta Posner, MD;  Location: [facility];  Service: Vascular;  Laterality: N/A;   [place]  08/12/2011   Procedure: IRRIGATION AND DEBRIDEMENT ABSCESS;  Surgeon: Molli Hazard, MD;  Location: [facility];  Service: Urology;  Laterality: N/A;  irrigation and debridment perineum     IRRIGATION AND DEBRIDEMENT ABSCESS  08/14/2011   Procedure: MINOR INCISION AND DRAINAGE OF ABSCESS;  Surgeon: Molli Hazard, MD;  Location: [facility];  Service: Urology;  Laterality: N/A;  Perineal Wound Debridement;Placement Bilateral Testicular Thigh Pouches   LEFT HEART CATH AND CORONARY ANGIOGRAPHY N/A 07/29/2018   Procedure: LEFT HEART CATH AND CORONARY ANGIOGRAPHY;  Surgeon: Leonie Man, MD;  Location: [facility];  Service: Cardiovascular;  Laterality: N/A;   LOWER EXTREMITY ANGIOGRAPHY Bilateral 01/07/2022   Procedure: Lower Extremity Angiography;  Surgeon: Waynetta Sandy, MD;  Location: [facility];  Service: Cardiovascular;  Laterality: Bilateral;   PERIPHERAL VASCULAR ATHERECTOMY Left 04/09/2021   Procedure: PERIPHERAL VASCULAR ATHERECTOMY;  Surgeon: Waynetta Sandy, MD;  Location: [facility];  Service: Cardiovascular;  Laterality: Left;  TP trunk and peroneal arteries   PERIPHERAL VASCULAR BALLOON ANGIOPLASTY Left 04/09/2021   Procedure: PERIPHERAL VASCULAR BALLOON ANGIOPLASTY;  Surgeon: Waynetta Sandy, MD;  Location: [facility];  Service: Cardiovascular;  Laterality: Left;  Popliteal   REVISON OF ARTERIOVENOUS FISTULA Left 05/24/2020   Procedure: LEFT ARM ARTERIOVENOUS FISTULA  CONVERSION TO BASILIC VEIN FISTULA;  Surgeon: Waynetta Sandy, MD;  Location: [facility];  Service: Vascular;  Laterality: Left;   TOTAL HIP ARTHROPLASTY Left 10/11/2016  Procedure: LEFT TOTAL HIP ARTHROPLASTY ANTERIOR APPROACH;  Surgeon: Mcarthur Rossetti, MD;  Location[facility];  Service: Orthopedics;  Laterality: Left;   UPPER EXTREMITY ANGIOGRAPHY Bilateral 01/07/2022   Procedure: Upper Extremity Angiography;  Surgeon: Waynetta Sandy, MD;  Location: [facility];  Service: Cardiovascular;  Laterality: Bilateral;   Social History   Socioeconomic History   Marital status: Widowed    Spouse name: Not on file   Number of children: Not on file   Years of education: Not on file   Highest education level: Not on file  Occupational  History   Occupation: Disabled  Tobacco Use   Smoking status: Former    Packs/day: 0.25    Years: 24.00    Total pack years: 6.00    Types: Cigarettes    Quit date: 11/19/2019    Years since quitting: 2.7   Smokeless tobacco: Never  Vaping Use   Vaping Use: Never used  Substance and Sexual Activity   Alcohol use: Not Currently    Alcohol/week: 0.0 standard drinks of alcohol    Comment: quit 2018   Drug use: No   Sexual activity: Not Currently    Birth control/protection: Condom  Other Topics Concern   Not on file  Social History Narrative   Lives w/ his 2 sons.   Social Determinants of Health   Financial Resource Strain: Not on file  Food Insecurity: Not on file  Transportation Needs: Not on file  Physical Activity: Not on file  Stress: Not on file  Social Connections: Not on file   Family History  Problem Relation Age of Onset   Diabetes Other    Pancreatic cancer Mother 66   Colon cancer Neg Hx    Colon polyps Neg Hx    Esophageal cancer Neg Hx    Rectal cancer Neg Hx    Stomach cancer Neg Hx    - negative except otherwise stated in the family history section Allergies  Allergen Reactions   Crestor [Rosuvastatin] Rash    Eye redness   Dilaudid [Hydromorphone] Itching   Robaxin [Methocarbamol] Other (See Comments)    Pt states medication made his arm pain worse.   Benadryl [Diphenhydramine] Rash   Prior to Admission medications   Medication Sig Start Date End Date Taking? Authorizing Provider  acetaminophen (TYLENOL) 500 MG tablet Take 1,000 mg by mouth Every Tuesday,Thursday,and Saturday with dialysis.    [provider]  ALPRAZolam Duanne Moron) 0.25 MG tablet Take 0.25 mg by mouth daily. 04/29/22   [provider]  aspirin EC 81 MG tablet Take 81 mg by mouth daily. Swallow whole.    [provider]  atorvastatin (LIPITOR) 80 MG tablet TAKE ONE TABLET BY MOUTH ONCE DAILY 08/01/22   Lelon Perla, MD  bismuth subsalicylate (PEPTO  BISMOL) 262 MG/15ML suspension Take 30 mLs by mouth every 6 (six) hours as needed for indigestion.    [provider]  clopidogrel (PLAVIX) 75 MG tablet TAKE ONE TABLET BY MOUTH ONCE DAILY Patient taking differently: Take 75 mg by mouth daily. 02/01/22   Waynetta Sandy, MD  ezetimibe (ZETIA) 10 MG tablet Take 1 tablet (10 mg total) by mouth daily. Patient not taking: Reported on 05/11/2022 01/17/22   Edwin Dada, MD  gabapentin (NEURONTIN) 100 MG capsule Take 100 mg by mouth 3 (three) times daily. 12/03/21   [provider]  HUMALOG MIX 75/25 KWIKPEN (75-25) 100 UNIT/ML KwikPen Inject 30 Units into the skin daily. 05/04/22  [provider]  insulin aspart protamine - aspart (NOVOLOG MIX 70/30 FLEXPEN) (70-30) 100 UNIT/ML FlexPen Inject 0.3 mLs (30 Units total) into the skin 2 (two) times daily with a meal. Patient taking differently: Inject 0-30 Units into the skin 2 (two) times daily as needed. 09/29/20   Lavina Hamman, MD  Insulin Pen Needle 31G X 5 MM MISC Use daily to inject insulin as instructed 07/01/18   Angiulli, Lavon Paganini, PA-C  linaclotide [facility]) 72 MCG capsule Take 1 capsule (72 mcg total) by mouth daily before breakfast. Patient taking differently: Take 72 mcg by mouth in the morning. 12/13/20   Armbruster, Carlota Raspberry, MD  oxyCODONE (ROXICODONE) 5 MG immediate release tablet 1-2 tabs PO q6 hours prn pain 05/23/22   Leanora Cover, MD  pantoprazole (PROTONIX) 40 MG tablet Take 1 tablet (40 mg total) by mouth daily. 08/01/22   Willia Craze, NP  sulfamethoxazole-trimethoprim (BACTRIM DS) 800-160 MG tablet Take 1 tablet by mouth 2 (two) times daily. 05/17/22   [provider]  VELPHORO 500 MG chewable tablet Chew 1,000 mg by mouth 2 (two) times daily. 06/14/20   [provider]   DG Chest Portable 1 View  Result Date: 08/05/2022 CLINICAL DATA:  Weakness.  End-stage renal disease. EXAM: PORTABLE CHEST 1 VIEW COMPARISON:   05/10/2022 FINDINGS: The cardio pericardial silhouette is enlarged. Right IJ central line tip overlies the right atrium. The lungs are clear without focal pneumonia, edema, pneumothorax or pleural effusion. The visualized bony structures of the thorax are unremarkable. Telemetry leads overlie the chest. IMPRESSION: Enlargement of the cardiopericardial silhouette without acute cardiopulmonary findings. Electronically Signed   By: Misty Stanley M.D.   On: 08/05/2022 13:02   - pertinent xrays, CT, MRI studies were reviewed and independently interpreted  Positive ROS: All other systems have been reviewed and were otherwise negative with the exception of those mentioned in the HPI and as above.  Physical Exam: General: Alert, no acute distress Psychiatric: Patient is competent for consent with normal mood and affect Lymphatic: No axillary or cervical lymphadenopathy Cardiovascular: No pedal edema Respiratory: No cyanosis, no use of accessory musculature GI: No organomegaly, abdomen is soft and non-tender    Images:  '@ENCIMAGES'$ @  Labs:  Lab Results  Component Value Date   HGBA1C 6.3 (H) 05/10/2022   HGBA1C 6.5 (H) 01/07/2022   HGBA1C 8.6 (H) 10/05/2021   ESRSEDRATE 128 (H) 06/15/2018   ESRSEDRATE 47 (H) 05/07/2016   CRP 21.3 (H) 01/12/2022   CRP 27.8 (H) 01/11/2022   CRP 25.5 (H) 01/10/2022   REPTSTATUS PENDING 08/05/2022   REPTSTATUS PENDING 08/05/2022   GRAMSTAIN  05/23/2022    RARE WBC PRESENT,BOTH PMN AND MONONUCLEAR RARE GRAM POSITIVE COCCI IN PAIRS    CULT  08/05/2022    NO GROWTH < 12 HOURS Performed at [facility], [place] [address], [place], [place] [address]    CULT  08/05/2022    NO GROWTH < 12 HOURS Performed at [facility] [address] [place], [place] [address]    [place] 05/23/2022   LABORGA STAPHYLOCOCCUS EPIDERMIDIS 05/23/2022    Lab Results  Component Value Date   ALBUMIN 2.7 (L) 08/06/2022   ALBUMIN 3.0 (L)  08/05/2022   ALBUMIN 3.2 (L) 05/11/2022        Latest Ref Rng & Units 08/05/2022   12:48 PM 08/05/2022   12:40 PM 05/23/2022   12:49 PM  CBC EXTENDED  WBC 4.0 - 10.5 K/uL  12.0    RBC 4.22 - 5.81 MIL/uL  3.70    Hemoglobin 13.0 - 17.0 g/dL 12.9  11.1  12.9   HCT 39.0 - 52.0 % 38.0  36.3  38.0   Platelets 150 - 400 K/uL  270    NEUT# 1.7 - 7.7 K/uL  9.7    Lymph# 0.7 - 4.0 K/uL  1.1      Neurologic: Patient does not have protective sensation bilateral lower extremities.   MUSCULOSKELETAL:   Skin: Examination patient has black ischemic gangrenous changes of the left foot heel and calf extending up to the popliteal fossa.  Proximal to the patella there is swelling.  Patient is a stable right above-knee amputation and status post multiple digit amputations.  Hemoglobin 12.9 albumin 2.7 with a hemoglobin A1c of 6.3.  Assessment: Assessment: Severe peripheral vascular disease with gangrenous changes of the entire left leg from the popliteal fossa distally.  Plan: Discussed with the patient the recommended treatment would be an above-the-knee amputation left similar to the right.  Discussed that we could proceed with surgery tomorrow afternoon.  I will discuss patient's case with vascular surgery to see if any endovascular options are available.  Thank you for the consult and the opportunity to see Mr. Aleen Sells, MD [facility] [phone] 7:27 AM

## 2022-08-06 NOTE — Progress Notes (Signed)
[facility] Progress Note   Subjective:   Pt seen in room. K+ 3.7 this AM, HR normalized. He is feeling better, less fatigued and no SOB, CP, or dizziness. Understandably worried about possible AKA.   Objective Vitals:   08/05/22 2003 08/05/22 2313 08/06/22 0427 08/06/22 0730  BP: 120/71 111/68 120/82 114/71  Pulse:    70  Resp: (!) '21 19 20   '$ Temp: 98.3 F (36.8 C) 98.4 F (36.9 C) 98.2 F (36.8 C) 98 F (36.7 C)  TempSrc: Oral Oral Oral Oral  SpO2:   95% 91%  Weight:      Height:       Physical Exam General: Alert male in NAD Heart: RRR, no murmurs, rubs or gallops Lungs: CTA bilaterally, no wheezing, rhonchi or rales Abdomen: Soft, non-distended, +BS Extremities: L leg wrapped, R AKA. Multiple finger amputations. No edema appreciated Dialysis Access: [facility]  Additional Objective Labs: Basic Metabolic Panel: Recent Labs  Lab 08/05/22 1240 08/05/22 1248 08/06/22 0344  NA 139 135 133*  K 7.0* 7.0* 3.7  CL 92* 100 94*  CO2 12*  --  22  GLUCOSE 105* 102* 148*  BUN 71* 84* 36*  CREATININE 8.84* 8.90* 4.83*  CALCIUM 10.2  --  8.6*   Liver Function Tests: Recent Labs  Lab 08/05/22 1240 08/06/22 0344  AST 140* 333*  ALT 74* 155*  ALKPHOS 166* 141*  BILITOT 1.0 1.1  PROT 8.3* 6.8  ALBUMIN 3.0* 2.7*   Recent Labs  Lab 08/05/22 1240  LIPASE 24   CBC: Recent Labs  Lab 08/05/22 1240 08/05/22 1248  WBC 12.0*  --   NEUTROABS 9.7*  --   HGB 11.1* 12.9*  HCT 36.3* 38.0*  MCV 98.1  --   PLT 270  --    Blood Culture    Component Value Date/Time   SDES BLOOD RIGHT ARM 08/05/2022 2105   SDES BLOOD RIGHT ARM 08/05/2022 2105   SPECREQUEST  08/05/2022 2105    BOTTLES DRAWN AEROBIC AND ANAEROBIC Blood Culture adequate volume   SPECREQUEST  08/05/2022 2105    BOTTLES DRAWN AEROBIC AND ANAEROBIC Blood Culture adequate volume   CULT  08/05/2022 2105    NO GROWTH < 12 HOURS Performed at [facility], [place] [address] [place], [place]  [address]    CULT  08/05/2022 2105    NO GROWTH < 12 HOURS Performed at [facility] [address], [place], [place] [address]    REPTSTATUS PENDING 08/05/2022 2105   REPTSTATUS PENDING 08/05/2022 2105    Cardiac Enzymes: Recent Labs  Lab 08/05/22 1318  CKTOTAL 221   CBG: Recent Labs  Lab 08/05/22 2002  GLUCAP 135*   Iron Studies: No results for input(s): "IRON", "TIBC", "TRANSFERRIN", "FERRITIN" in the last 72 hours. '@lablastinr3'$ @ Studies/Results: DG Chest Portable 1 View  Result Date: 08/05/2022 CLINICAL DATA:  Weakness.  End-stage renal disease. EXAM: PORTABLE CHEST 1 VIEW COMPARISON:  05/10/2022 FINDINGS: The cardio pericardial silhouette is enlarged. Right IJ central line tip overlies the right atrium. The lungs are clear without focal pneumonia, edema, pneumothorax or pleural effusion. The visualized bony structures of the thorax are unremarkable. Telemetry leads overlie the chest. IMPRESSION: Enlargement of the cardiopericardial silhouette without acute cardiopulmonary findings. Electronically Signed   By: Misty Stanley M.D.   On: 08/05/2022 13:02   Medications:   acetaminophen  1,000 mg Oral Q T,Th,Sa-HD   ALPRAZolam  0.25 mg Oral Daily   aspirin EC  81  mg Oral Daily   Chlorhexidine Gluconate Cloth  6 each Topical Q0600   Chlorhexidine Gluconate Cloth  6 each Topical Q0600   clopidogrel  75 mg Oral Daily   gabapentin  100 mg Oral TID   heparin  5,000 Units Subcutaneous Q12H   insulin aspart  0-9 Units Subcutaneous TID WC   linaclotide  72 mcg Oral q AM   linagliptin  5 mg Oral Daily   pantoprazole  40 mg Oral Daily   sodium bicarbonate  650 mg Oral BID   sucroferric oxyhydroxide  1,000 mg Oral BID WC    Dialysis Orders: Center: [facility]  on TTS. 180Nre 4 hours 30 min BFR 425 DFR 700 EDW 89kg 2K 2Ca TDC Heparin 8000 unit bolus and 2000 units mid HD Mircera 143mg IV q 2 weeks Venofer '50mg'$  weekly Hectorol 173m IV q HD On korsuva for itching  Velphoro  3 tabs with meals- last phos 12.5.  Assessment/Plan:  Bradycardia: likely due to hyperkalemia. Not on any CCB or BB. Resolved with emergent HD 12/18.  Hyperkalemia: Likely due to abbreviated HD. Discussed importance of getting full dialysis treatments L foot wound: seen by vascular and ortho, recommendation is for AKA. Pt is thinking about it  ESRD:  On TTS schedule but had emergent HD yesterday, will resume TTS schedule today.   Hypertension/volume: BP soft on arrival, now improved. Pt feels he needs more fluid off, will attempt 2.5-3L as tolerated today  Anemia: Hgb above goal, no ESA indicated at this time  Metabolic bone disease: Calcium elevated, will hold hectorol for now. Very noncomplaint with binders/diet, last phos was 12.5 which is probably why he has had pruritus. Resume binders here  Nutrition:  Will need renal diet/fluid restrictions.     SaAnice PaganiniPA-C 08/06/2022, 8:12 AM  [facility] Pager: [phone]

## 2022-08-06 NOTE — Procedures (Signed)
HD Note:  Some information was entered later than the data was gathered due to patient care needs. The stated time with the data is accurate.  Received patient in bed to unit.  Alert and oriented.  Informed consent signed and in chart.  Patient rested during treatment with no complaints.  Vancomycin given in the last hour of treatment.  Treatment was extended to allow for antibiotic to complete.  Patient tolerated well.  Transported back to the room  Alert, without acute distress.  Hand-off given to patient's nurse.   Access used: Right chest HD catheter Access issues: None  Total UF removed: [facility]

## 2022-08-07 ENCOUNTER — Other Ambulatory Visit: Payer: Self-pay

## 2022-08-07 ENCOUNTER — Inpatient Hospital Stay (HOSPITAL_COMMUNITY): Payer: Medicare Other | Admitting: Anesthesiology

## 2022-08-07 ENCOUNTER — Encounter (HOSPITAL_COMMUNITY)
Admission: EM | Disposition: A | Discharge status: Rehabilitation Facility | Payer: Self-pay | Source: Home / Self Care | Attending: Internal Medicine

## 2022-08-07 ENCOUNTER — Ambulatory Visit (HOSPITAL_COMMUNITY): Payer: Medicare Other

## 2022-08-07 ENCOUNTER — Encounter (HOSPITAL_COMMUNITY): Payer: Self-pay | Admitting: Internal Medicine

## 2022-08-07 ENCOUNTER — Ambulatory Visit: Payer: Medicare Other | Admitting: Vascular Surgery

## 2022-08-07 DIAGNOSIS — I739 Peripheral vascular disease, unspecified: Secondary | ICD-10-CM | POA: Diagnosis not present

## 2022-08-07 DIAGNOSIS — Z794 Long term (current) use of insulin: Secondary | ICD-10-CM

## 2022-08-07 DIAGNOSIS — I70262 Atherosclerosis of native arteries of extremities with gangrene, left leg: Secondary | ICD-10-CM | POA: Diagnosis not present

## 2022-08-07 DIAGNOSIS — G4730 Sleep apnea, unspecified: Secondary | ICD-10-CM | POA: Diagnosis not present

## 2022-08-07 DIAGNOSIS — F418 Other specified anxiety disorders: Secondary | ICD-10-CM | POA: Diagnosis not present

## 2022-08-07 DIAGNOSIS — E1152 Type 2 diabetes mellitus with diabetic peripheral angiopathy with gangrene: Secondary | ICD-10-CM | POA: Diagnosis not present

## 2022-08-07 DIAGNOSIS — E875 Hyperkalemia: Secondary | ICD-10-CM | POA: Diagnosis not present

## 2022-08-07 HISTORY — PX: APPLICATION OF WOUND VAC: SHX5189

## 2022-08-07 HISTORY — PX: AMPUTATION: SHX166

## 2022-08-07 LAB — COMPREHENSIVE METABOLIC PANEL
ALT: 273 U/L — ABNORMAL HIGH (ref 0–44)
AST: 451 U/L — ABNORMAL HIGH (ref 15–41)
Albumin: 2.4 g/dL — ABNORMAL LOW (ref 3.5–5.0)
Alkaline Phosphatase: 132 U/L — ABNORMAL HIGH (ref 38–126)
Anion gap: 18 — ABNORMAL HIGH (ref 5–15)
BUN: 30 mg/dL — ABNORMAL HIGH (ref 6–20)
CO2: 22 mmol/L (ref 22–32)
Calcium: 8.6 mg/dL — ABNORMAL LOW (ref 8.9–10.3)
Chloride: 96 mmol/L — ABNORMAL LOW (ref 98–111)
Creatinine, Ser: 4.37 mg/dL — ABNORMAL HIGH (ref 0.61–1.24)
GFR, Estimated: 15 mL/min — ABNORMAL LOW (ref >60)
Glucose, Bld: 157 mg/dL — ABNORMAL HIGH (ref 70–99)
Potassium: 3.8 mmol/L (ref 3.5–5.1)
Sodium: 136 mmol/L (ref 135–145)
Total Bilirubin: 1.2 mg/dL (ref 0.3–1.2)
Total Protein: 6.5 g/dL (ref 6.5–8.1)

## 2022-08-07 LAB — CBC WITH DIFFERENTIAL/PLATELET
Abs Immature Granulocytes: 0.08 10*3/uL — ABNORMAL HIGH (ref 0.00–0.07)
Basophils Absolute: 0.1 10*3/uL (ref 0.0–0.1)
Basophils Relative: 1 %
Eosinophils Absolute: 0.1 10*3/uL (ref 0.0–0.5)
Eosinophils Relative: 1 %
HCT: 31.6 % — ABNORMAL LOW (ref 39.0–52.0)
Hemoglobin: 9.8 g/dL — ABNORMAL LOW (ref 13.0–17.0)
Immature Granulocytes: 1 %
Lymphocytes Relative: 6 %
Lymphs Abs: 0.6 10*3/uL — ABNORMAL LOW (ref 0.7–4.0)
MCH: 29.3 pg (ref 26.0–34.0)
MCHC: 31 g/dL (ref 30.0–36.0)
MCV: 94.6 fL (ref 80.0–100.0)
Monocytes Absolute: 0.7 10*3/uL (ref 0.1–1.0)
Monocytes Relative: 7 %
Neutro Abs: 7.8 10*3/uL — ABNORMAL HIGH (ref 1.7–7.7)
Neutrophils Relative %: 84 %
Platelets: 213 10*3/uL (ref 150–400)
RBC: 3.34 MIL/uL — ABNORMAL LOW (ref 4.22–5.81)
RDW: 17.1 % — ABNORMAL HIGH (ref 11.5–15.5)
WBC: 9.3 10*3/uL (ref 4.0–10.5)
nRBC: 0.5 % — ABNORMAL HIGH (ref 0.0–0.2)

## 2022-08-07 LAB — HEPATITIS PANEL, ACUTE
HCV Ab: NONREACTIVE
Hep A IgM: NONREACTIVE
Hep B C IgM: NONREACTIVE
Hepatitis B Surface Ag: NONREACTIVE

## 2022-08-07 LAB — GLUCOSE, CAPILLARY
Glucose-Capillary: 181 mg/dL — ABNORMAL HIGH (ref 70–99)
Glucose-Capillary: 132 mg/dL — ABNORMAL HIGH (ref 70–99)
Glucose-Capillary: 140 mg/dL — ABNORMAL HIGH (ref 70–99)
Glucose-Capillary: 172 mg/dL — ABNORMAL HIGH (ref 70–99)

## 2022-08-07 LAB — PROCALCITONIN: Procalcitonin: 10.91 ng/mL

## 2022-08-07 SURGERY — AMPUTATION, ABOVE KNEE
Anesthesia: General | Site: Leg Upper | Laterality: Left

## 2022-08-07 MED ORDER — CHLORHEXIDINE GLUCONATE CLOTH 2 % EX PADS
6.0000 | MEDICATED_PAD | Freq: Every day | CUTANEOUS | Status: DC
  Administered 2022-08-07 – 2022-08-12 (×4): 6 via TOPICAL

## 2022-08-07 MED ORDER — HYDRALAZINE HCL 20 MG/ML IJ SOLN: 5.0000 mg | INTRAMUSCULAR | Status: DC | PRN

## 2022-08-07 MED ORDER — FENTANYL CITRATE (PF) 250 MCG/5ML IJ SOLN
INTRAMUSCULAR | Status: AC
  Filled 2022-08-07: qty 5

## 2022-08-07 MED ORDER — LIDOCAINE 2% (20 MG/ML) 5 ML SYRINGE
INTRAMUSCULAR | Status: AC
  Filled 2022-08-07: qty 5

## 2022-08-07 MED ORDER — JUVEN PO PACK
1.0000 | PACK | Freq: Two times a day (BID) | ORAL | Status: DC
  Administered 2022-08-09 – 2022-08-13 (×7): 1 via ORAL
  Filled 2022-08-07 (×7): qty 1

## 2022-08-07 MED ORDER — ONDANSETRON HCL 4 MG/2ML IJ SOLN
INTRAMUSCULAR | Status: DC | PRN
  Administered 2022-08-07: 4 mg via INTRAVENOUS

## 2022-08-07 MED ORDER — DEXMEDETOMIDINE HCL IN NACL 80 MCG/20ML IV SOLN
INTRAVENOUS | Status: DC | PRN
  Administered 2022-08-07 (×2): 10 ug via INTRAVENOUS

## 2022-08-07 MED ORDER — ALUM & MAG HYDROXIDE-SIMETH 200-200-20 MG/5ML PO SUSP: 15.0000 mL | ORAL | Status: DC | PRN

## 2022-08-07 MED ORDER — MAGNESIUM SULFATE 2 GM/50ML IV SOLN: 2.0000 g | Freq: Every day | INTRAVENOUS | Status: DC | PRN

## 2022-08-07 MED ORDER — PROPOFOL 10 MG/ML IV BOLUS
INTRAVENOUS | Status: AC
  Filled 2022-08-07: qty 20

## 2022-08-07 MED ORDER — FENTANYL CITRATE (PF) 100 MCG/2ML IJ SOLN
25.0000 ug | INTRAMUSCULAR | Status: DC | PRN
  Administered 2022-08-07: 50 ug via INTRAVENOUS

## 2022-08-07 MED ORDER — ACETAMINOPHEN 160 MG/5ML PO SOLN: 325.0000 mg | ORAL | Status: DC | PRN

## 2022-08-07 MED ORDER — OXYCODONE HCL 5 MG PO TABS
5.0000 mg | ORAL_TABLET | ORAL | Status: DC | PRN
  Administered 2022-08-09: 10 mg via ORAL
  Administered 2022-08-10: 5 mg via ORAL
  Filled 2022-08-07: qty 2
  Filled 2022-08-07: qty 1

## 2022-08-07 MED ORDER — FENTANYL CITRATE (PF) 250 MCG/5ML IJ SOLN
INTRAMUSCULAR | Status: DC | PRN
  Administered 2022-08-07: 50 ug via INTRAVENOUS
  Administered 2022-08-07: 100 ug via INTRAVENOUS

## 2022-08-07 MED ORDER — CHLORHEXIDINE GLUCONATE 0.12 % MT SOLN
OROMUCOSAL | Status: AC
  Filled 2022-08-07: qty 15

## 2022-08-07 MED ORDER — INSULIN ASPART 100 UNIT/ML IJ SOLN: 0.0000 [IU] | INTRAMUSCULAR | Status: DC | PRN

## 2022-08-07 MED ORDER — PROPOFOL 10 MG/ML IV BOLUS
INTRAVENOUS | Status: DC | PRN
  Administered 2022-08-07: 150 mg via INTRAVENOUS

## 2022-08-07 MED ORDER — ACETAMINOPHEN 325 MG PO TABS
325.0000 mg | ORAL_TABLET | Freq: Four times a day (QID) | ORAL | Status: DC | PRN
  Administered 2022-08-10 – 2022-08-11 (×5): 650 mg via ORAL
  Filled 2022-08-07 (×5): qty 2

## 2022-08-07 MED ORDER — ZINC SULFATE 220 (50 ZN) MG PO CAPS
220.0000 mg | ORAL_CAPSULE | Freq: Every day | ORAL | Status: DC
  Administered 2022-08-07 – 2022-08-13 (×5): 220 mg via ORAL
  Filled 2022-08-07 (×5): qty 1

## 2022-08-07 MED ORDER — SODIUM CHLORIDE 0.9 % IV SOLN: INTRAVENOUS | Status: DC

## 2022-08-07 MED ORDER — LABETALOL HCL 5 MG/ML IV SOLN: 10.0000 mg | INTRAVENOUS | Status: DC | PRN

## 2022-08-07 MED ORDER — OXYCODONE HCL 5 MG/5ML PO SOLN: 5.0000 mg | Freq: Once | ORAL | Status: DC | PRN

## 2022-08-07 MED ORDER — POLYETHYLENE GLYCOL 3350 17 G PO PACK: 17.0000 g | PACK | Freq: Every day | ORAL | Status: DC | PRN

## 2022-08-07 MED ORDER — OXYCODONE HCL 5 MG PO TABS: 5.0000 mg | ORAL_TABLET | Freq: Once | ORAL | Status: DC | PRN

## 2022-08-07 MED ORDER — METOPROLOL TARTRATE 5 MG/5ML IV SOLN: 2.0000 mg | INTRAVENOUS | Status: DC | PRN

## 2022-08-07 MED ORDER — PHENYLEPHRINE 80 MCG/ML (10ML) SYRINGE FOR IV PUSH (FOR BLOOD PRESSURE SUPPORT)
PREFILLED_SYRINGE | INTRAVENOUS | Status: AC
  Filled 2022-08-07: qty 10

## 2022-08-07 MED ORDER — ONDANSETRON HCL 4 MG/2ML IJ SOLN: 4.0000 mg | Freq: Once | INTRAMUSCULAR | Status: DC | PRN

## 2022-08-07 MED ORDER — MEPERIDINE HCL 25 MG/ML IJ SOLN: 6.2500 mg | INTRAMUSCULAR | Status: DC | PRN

## 2022-08-07 MED ORDER — EPHEDRINE SULFATE (PRESSORS) 50 MG/ML IJ SOLN
INTRAMUSCULAR | Status: DC | PRN
  Administered 2022-08-07 (×2): 5 mg via INTRAVENOUS

## 2022-08-07 MED ORDER — GUAIFENESIN-DM 100-10 MG/5ML PO SYRP: 15.0000 mL | ORAL_SOLUTION | ORAL | Status: DC | PRN

## 2022-08-07 MED ORDER — PHENOL 1.4 % MT LIQD: 1.0000 | OROMUCOSAL | Status: DC | PRN

## 2022-08-07 MED ORDER — ONDANSETRON HCL 4 MG/2ML IJ SOLN
4.0000 mg | Freq: Four times a day (QID) | INTRAMUSCULAR | Status: DC | PRN
  Administered 2022-08-12: 4 mg via INTRAVENOUS
  Filled 2022-08-07: qty 2

## 2022-08-07 MED ORDER — ORAL CARE MOUTH RINSE: 15.0000 mL | Freq: Once | OROMUCOSAL | Status: AC

## 2022-08-07 MED ORDER — MORPHINE SULFATE (PF) 2 MG/ML IV SOLN
2.0000 mg | INTRAVENOUS | Status: DC | PRN
  Administered 2022-08-07 – 2022-08-12 (×4): 2 mg via INTRAVENOUS
  Filled 2022-08-07 (×4): qty 1

## 2022-08-07 MED ORDER — OXYCODONE HCL 5 MG PO TABS
10.0000 mg | ORAL_TABLET | ORAL | Status: DC | PRN
  Administered 2022-08-07 – 2022-08-08 (×3): 15 mg via ORAL
  Filled 2022-08-07 (×3): qty 3

## 2022-08-07 MED ORDER — PHENYLEPHRINE HCL (PRESSORS) 10 MG/ML IV SOLN
INTRAVENOUS | Status: DC | PRN
  Administered 2022-08-07 (×3): 80 ug via INTRAVENOUS

## 2022-08-07 MED ORDER — LACTATED RINGERS IV SOLN: INTRAVENOUS | Status: DC | PRN

## 2022-08-07 MED ORDER — CEFAZOLIN SODIUM-DEXTROSE 2-4 GM/100ML-% IV SOLN: 2.0000 g | Freq: Three times a day (TID) | INTRAVENOUS | Status: DC

## 2022-08-07 MED ORDER — MIDAZOLAM HCL 2 MG/2ML IJ SOLN
INTRAMUSCULAR | Status: AC
  Filled 2022-08-07: qty 2

## 2022-08-07 MED ORDER — DOCUSATE SODIUM 100 MG PO CAPS
100.0000 mg | ORAL_CAPSULE | Freq: Every day | ORAL | Status: DC
  Administered 2022-08-09 – 2022-08-11 (×2): 100 mg via ORAL
  Filled 2022-08-07 (×2): qty 1

## 2022-08-07 MED ORDER — VITAMIN C 500 MG PO TABS
1000.0000 mg | ORAL_TABLET | Freq: Every day | ORAL | Status: DC
  Administered 2022-08-07 – 2022-08-13 (×5): 1000 mg via ORAL
  Filled 2022-08-07 (×5): qty 2

## 2022-08-07 MED ORDER — CHLORHEXIDINE GLUCONATE 0.12 % MT SOLN: 15.0000 mL | Freq: Once | OROMUCOSAL | Status: AC

## 2022-08-07 MED ORDER — SODIUM CHLORIDE 0.9 % IV SOLN: INTRAVENOUS | Status: DC | PRN

## 2022-08-07 MED ORDER — SODIUM CHLORIDE 0.9% FLUSH
3.0000 mL | INTRAVENOUS | Status: DC | PRN
  Administered 2022-08-09: 3 mL via INTRAVENOUS

## 2022-08-07 MED ORDER — FENTANYL CITRATE (PF) 100 MCG/2ML IJ SOLN
INTRAMUSCULAR | Status: AC
  Filled 2022-08-07: qty 2

## 2022-08-07 MED ORDER — BISACODYL 5 MG PO TBEC: 5.0000 mg | DELAYED_RELEASE_TABLET | Freq: Every day | ORAL | Status: DC | PRN

## 2022-08-07 MED ORDER — CHLORHEXIDINE GLUCONATE 0.12 % MT SOLN
OROMUCOSAL | Status: AC
  Administered 2022-08-07: 15 mL via OROMUCOSAL
  Filled 2022-08-07: qty 15

## 2022-08-07 MED ORDER — LIDOCAINE 2% (20 MG/ML) 5 ML SYRINGE
INTRAMUSCULAR | Status: DC | PRN
  Administered 2022-08-07: 80 mg via INTRAVENOUS

## 2022-08-07 MED ORDER — SODIUM CHLORIDE 0.9 % IV SOLN: 250.0000 mL | INTRAVENOUS | Status: DC | PRN

## 2022-08-07 MED ORDER — PHENYLEPHRINE 80 MCG/ML (10ML) SYRINGE FOR IV PUSH (FOR BLOOD PRESSURE SUPPORT)
PREFILLED_SYRINGE | INTRAVENOUS | Status: DC | PRN
  Administered 2022-08-07: 80 ug via INTRAVENOUS

## 2022-08-07 MED ORDER — POTASSIUM CHLORIDE CRYS ER 20 MEQ PO TBCR: 20.0000 meq | EXTENDED_RELEASE_TABLET | Freq: Every day | ORAL | Status: DC | PRN

## 2022-08-07 MED ORDER — CEFAZOLIN SODIUM-DEXTROSE 2-4 GM/100ML-% IV SOLN
INTRAVENOUS | Status: AC
  Filled 2022-08-07: qty 100

## 2022-08-07 MED ORDER — ACETAMINOPHEN 325 MG PO TABS: 325.0000 mg | ORAL_TABLET | ORAL | Status: DC | PRN

## 2022-08-07 MED ORDER — SODIUM CHLORIDE 0.9% FLUSH
3.0000 mL | Freq: Two times a day (BID) | INTRAVENOUS | Status: DC
  Administered 2022-08-07 – 2022-08-13 (×8): 3 mL via INTRAVENOUS

## 2022-08-07 MED ORDER — MAGNESIUM CITRATE PO SOLN: 1.0000 | Freq: Once | ORAL | Status: DC | PRN

## 2022-08-07 MED ORDER — PANTOPRAZOLE SODIUM 40 MG PO TBEC: 40.0000 mg | DELAYED_RELEASE_TABLET | Freq: Every day | ORAL | Status: DC

## 2022-08-07 MED ORDER — 0.9 % SODIUM CHLORIDE (POUR BTL) OPTIME
TOPICAL | Status: DC | PRN
  Administered 2022-08-07: 1000 mL

## 2022-08-07 SURGICAL SUPPLY — 39 items
BAG COUNTER SPONGE SURGICOUNT (BAG) IMPLANT
BAG SPNG CNTER NS LX DISP (BAG) ×2
BLADE SAW RECIP 87.9 MT (BLADE) ×2 IMPLANT
BLADE SURG 21 STRL SS (BLADE) ×3 IMPLANT
BNDG COHESIVE 6X5 TAN STRL LF (GAUZE/BANDAGES/DRESSINGS) ×2 IMPLANT
CANISTER WOUND CARE 500ML ATS (WOUND CARE) IMPLANT
COVER SURGICAL LIGHT HANDLE (MISCELLANEOUS) ×3 IMPLANT
DRAPE INCISE IOBAN 66X45 STRL (DRAPES) ×4 IMPLANT
DRAPE U-SHAPE 47X51 STRL (DRAPES) ×2 IMPLANT
DRESSING PREVENA PLUS CUSTOM (GAUZE/BANDAGES/DRESSINGS) ×2 IMPLANT
DRSG PREVENA PLUS CUSTOM (GAUZE/BANDAGES/DRESSINGS) ×2
DURAPREP 26ML APPLICATOR (WOUND CARE) ×3 IMPLANT
ELECT REM PT RETURN 9FT ADLT (ELECTROSURGICAL) ×2
ELECTRODE REM PT RTRN 9FT ADLT (ELECTROSURGICAL) ×3 IMPLANT
GLOVE BIOGEL PI IND STRL 7.5 (GLOVE) ×2 IMPLANT
GLOVE BIOGEL PI IND STRL 9 (GLOVE) ×2 IMPLANT
GLOVE SURG ORTHO 9.0 STRL STRW (GLOVE) ×2 IMPLANT
GLOVE SURG SS PI 6.5 STRL IVOR (GLOVE) ×2 IMPLANT
GOWN STRL REUS W/ TWL LRG LVL3 (GOWN DISPOSABLE) ×3 IMPLANT
GOWN STRL REUS W/ TWL XL LVL3 (GOWN DISPOSABLE) ×4 IMPLANT
GOWN STRL REUS W/TWL LRG LVL3 (GOWN DISPOSABLE) ×2
GOWN STRL REUS W/TWL XL LVL3 (GOWN DISPOSABLE) ×4
GRAFT SKIN WND MICRO 38 (Tissue) IMPLANT
KIT BASIN OR (CUSTOM PROCEDURE TRAY) ×2 IMPLANT
KIT TURNOVER KIT B (KITS) ×2 IMPLANT
NS IRRIG 1000ML POUR BTL (IV SOLUTION) ×2 IMPLANT
PACK ORTHO EXTREMITY (CUSTOM PROCEDURE TRAY) ×2 IMPLANT
PAD ARMBOARD 7.5X6 YLW CONV (MISCELLANEOUS) ×2 IMPLANT
PREVENA RESTOR ARTHOFORM 46X30 (CANNISTER) ×2 IMPLANT
STAPLER VISISTAT 35W (STAPLE) IMPLANT
STOCKINETTE IMPERVIOUS LG (DRAPES) IMPLANT
SUT ETHILON 2 0 PSLX (SUTURE) ×4 IMPLANT
SUT SILK 2 0 (SUTURE) ×2
SUT SILK 2-0 18XBRD TIE 12 (SUTURE) ×3 IMPLANT
SUT VIC AB 1 CTX 36 (SUTURE) ×2
SUT VIC AB 1 CTX36XBRD ANBCTR (SUTURE) IMPLANT
TOWEL GREEN STERILE FF (TOWEL DISPOSABLE) ×3 IMPLANT
TUBE CONNECTING 20X1/4 (TUBING) ×2 IMPLANT
YANKAUER SUCT BULB TIP NO VENT (SUCTIONS) ×2 IMPLANT

## 2022-08-07 NOTE — Progress Notes (Addendum)
Pt signed left above the knee amputation surgical consent willingly.

## 2022-08-07 NOTE — Progress Notes (Addendum)
[facility] Progress Note   Subjective:   Agreed to AKA after talking to Dr. Donzetta Matters. Feels ok today, denies SOB, CP, dizziness, HA, and nausea.   Objective Vitals:   08/06/22 1835 08/06/22 2045 08/07/22 0408 08/07/22 0816  BP: 109/72 105/76 116/78 99/73  Pulse: 83 89  87  Resp: 18  13   Temp:  98.5 F (36.9 C) 98.7 F (37.1 C) 98.1 F (36.7 C)  TempSrc:  Oral Oral Oral  SpO2: 99% 99% 99% 100%  Weight:      Height:       Physical Exam General: Alert male in NAD Heart: RRR, no murmurs, rubs or gallops Lungs: CTA bilaterally, no wheezing, rhonchi or rales Abdomen: Soft, non-distended, +BS Extremities: L leg wrapped, R AKA. Multiple finger amputations. No edema appreciated Dialysis Access: [facility]  Additional Objective Labs: Basic Metabolic Panel: Recent Labs  Lab 08/05/22 1240 08/05/22 1248 08/06/22 0344 08/07/22 0321  NA 139 135 133* 136  K 7.0* 7.0* 3.7 3.8  CL 92* 100 94* 96*  CO2 12*  --  22 22  GLUCOSE 105* 102* 148* 157*  BUN 71* 84* 36* 30*  CREATININE 8.84* 8.90* 4.83* 4.37*  CALCIUM 10.2  --  8.6* 8.6*   Liver Function Tests: Recent Labs  Lab 08/05/22 1240 08/06/22 0344 08/07/22 0321  AST 140* 333* 451*  ALT 74* 155* 273*  ALKPHOS 166* 141* 132*  BILITOT 1.0 1.1 1.2  PROT 8.3* 6.8 6.5  ALBUMIN 3.0* 2.7* 2.4*   Recent Labs  Lab 08/05/22 1240  LIPASE 24   CBC: Recent Labs  Lab 08/05/22 1240 08/05/22 1248 08/06/22 0344 08/07/22 0321  WBC 12.0*  --  10.5 9.3  NEUTROABS 9.7*  --   --  7.8*  HGB 11.1* 12.9* 9.5* 9.8*  HCT 36.3* 38.0* 29.4* 31.6*  MCV 98.1  --  93.3 94.6  PLT 270  --  219 213   Blood Culture    Component Value Date/Time   SDES BLOOD RIGHT ARM 08/05/2022 2105   SDES BLOOD RIGHT ARM 08/05/2022 2105   SPECREQUEST  08/05/2022 2105    BOTTLES DRAWN AEROBIC AND ANAEROBIC Blood Culture adequate volume   SPECREQUEST  08/05/2022 2105    BOTTLES DRAWN AEROBIC AND ANAEROBIC Blood Culture adequate volume   CULT   08/05/2022 2105    NO GROWTH 2 DAYS Performed at [facility], [place] [address] [place], [place] [address]    CULT  08/05/2022 2105    NO GROWTH 2 DAYS Performed at [facility] [address], [place], [place] [address]    REPTSTATUS PENDING 08/05/2022 2105   REPTSTATUS PENDING 08/05/2022 2105    Cardiac Enzymes: Recent Labs  Lab 08/05/22 1318  CKTOTAL 221   CBG: Recent Labs  Lab 08/05/22 2002 08/06/22 0918 08/06/22 1205  GLUCAP 135* 209* 110*   Iron Studies: No results for input(s): "IRON", "TIBC", "TRANSFERRIN", "FERRITIN" in the last 72 hours. '@lablastinr3'$ @ Studies/Results: US Abdomen Limited RUQ (LIVER/GB)  Result Date: 08/06/2022 CLINICAL DATA:  Elevated LFT EXAM: ULTRASOUND ABDOMEN LIMITED RIGHT UPPER QUADRANT COMPARISON:  None Available. FINDINGS: Gallbladder: Small stones. Negative sonographic Murphy. Slight increased wall thickness at 4.7 mm. Common bile duct: Diameter: 6.6 mm Liver: Slightly echogenic liver parenchyma. No focal hepatic abnormality. Portal vein is patent on color Doppler imaging with normal direction of blood flow towards the liver. Other: None. IMPRESSION: 1. Cholelithiasis with gallbladder wall thickening but negative sonographic Murphy. Upper normal common duct diameter. 2.  Possible slightly echogenic liver parenchyma suggesting hepatic steatosis. Electronically Signed   By: Donavan Foil M.D.   On: 08/06/2022 22:41   DG Chest Portable 1 View  Result Date: 08/05/2022 CLINICAL DATA:  Weakness.  End-stage renal disease. EXAM: PORTABLE CHEST 1 VIEW COMPARISON:  05/10/2022 FINDINGS: The cardio pericardial silhouette is enlarged. Right IJ central line tip overlies the right atrium. The lungs are clear without focal pneumonia, edema, pneumothorax or pleural effusion. The visualized bony structures of the thorax are unremarkable. Telemetry leads overlie the chest. IMPRESSION: Enlargement of the cardiopericardial silhouette without acute  cardiopulmonary findings. Electronically Signed   By: Misty Stanley M.D.   On: 08/05/2022 13:02   Medications:   ceFAZolin (ANCEF) IV     ceFEPime (MAXIPIME) IV     metronidazole 500 mg (08/07/22 3546)   [START ON 08/08/2022] vancomycin      ALPRAZolam  0.25 mg Oral Daily   aspirin EC  81 mg Oral Daily   Chlorhexidine Gluconate Cloth  6 each Topical Q0600   Chlorhexidine Gluconate Cloth  6 each Topical Q0600   Chlorhexidine Gluconate Cloth  6 each Topical Q0600   clopidogrel  75 mg Oral Daily   gabapentin  100 mg Oral TID   heparin  5,000 Units Subcutaneous Q12H   insulin aspart  0-9 Units Subcutaneous TID WC   linaclotide  72 mcg Oral q AM   linagliptin  5 mg Oral Daily   pantoprazole  40 mg Oral Daily   sodium bicarbonate  650 mg Oral BID   sucroferric oxyhydroxide  1,000 mg Oral BID WC    Dialysis Orders: Center: [facility]  on TTS. 180Nre 4 hours 30 min BFR 425 DFR 700 EDW 89kg 2K 2Ca TDC Heparin 8000 unit bolus and 2000 units mid HD Mircera 111mg IV q 2 weeks- last dose 1045m on 12/16 Venofer '50mg'$  weekly Hectorol 1081mIV q HD On korsuva for itching  Velphoro 3 tabs with meals- last phos 12.5.    Assessment/Plan:  Bradycardia: likely due to hyperkalemia. Not on any CCB or BB. Resolved with emergent HD 12/18.  Hyperkalemia: Likely due to abbreviated HD. Discussed importance of getting full dialysis treatments L foot wound: seen by vascular and ortho, recommendation is for AKA- looks like this is planned for today  ESRD:  On TTS schedule but had emergent HD Monday, now back on TTS schedule  Hypertension/volume: BP soft on arrival, now improved. Euvolemic on exam and close to his EDW  Anemia: Hgb 9.8, anticipate further decline post op. ESA recently given, follow trend  Metabolic bone disease: Calcium elevated, will hold hectorol for now. Very noncomplaint with binders/diet, last phos was 12.5 which is probably why he has had pruritus. Resume binders here  Nutrition:  Will  need renal diet/fluid restrictions.  Abnormal LFT's - RUQ US [place] cholelithiasis and gallbladder wall thickening.  Negative US [place] sign, acute hepatitis panel negative.  Possible drug induced?  Would ask pharmacy to comment as they are dosing his antibiotics.  SamAnice PaganiniA-C 08/07/2022, 9:07 AM  [facility] Pager: [phone] have seen and examined this patient and agree with plan and assessment in the above note with renal recommendations/intervention highlighted. Pt to OR for LAKOrpah Clintonladonato,MD 08/07/2022 12:29 PM

## 2022-08-07 NOTE — Progress Notes (Signed)
PROGRESS NOTE    Edward [name]  JHE:[id number] DOB: 1970/11/27 DOA: 08/05/2022 PCP: [name], MD   Brief Narrative:  51 y.o. male with medical history significant of ESRD on HD, PVD status post multiple amputations including right AKA, and multiple fingers amputation, and left transmetatarsal amputation, with chronic left leg and foot and left shin wound, IDDM, combined chronic combined CHF with LVEF 45%, chronic orthostatic hypotension, recurrent hyperkalemia, presented with worsening nausea, vomiting and dizziness.  On presentation, he was bradycardic and hypotensive: Blood pressure responded to a small IV bolus of 100 ml normal saline.  Potassium of 7, creatinine of 8.8.  EKG showed sinus bradycardia with T wave.  He was given Lokelma, D50 and insulin and underwent emergent hemodialysis.  Vascular surgery and orthopedic surgery were consulted.  Assessment & Plan:   Hyperkalemia: Resolved End-stage renal disease on hemodialysis Acute metabolic acidosis -Presented with potassium of 7.  Required oral Lokelma, D50 and insulin and underwent emergent hemodialysis.  Dialysis as per nephrology schedule.  Potassium 3.8 this morning -Acidosis has resolved   SIRS Chronic left lower extremity nonhealing ulcers and left heel dry gangrene PVD with history of multiple amputations including AKA, multiple fingers amputation and left transmetatarsal amputation -Orthopedics and vascular surgery following.  Vascular surgery was of the opinion that patient does not have any revascularization options and recommended AKA.   -Dr. Sharol Given following and planning for AKA today.  -Procalcitonin elevated.  Continue empiric antibiotics.  Lactic acidosis -Improving  Anemia of chronic disease -Possibly from renal failure.  Hemoglobin stable.  Monitor intermittently  Leukocytosis -Repeat a.m. labs  Hyponatremia -Improved with managed by nephrology by dialysis  Acute  transaminitis Cholelithiasis -Worsening.  Repeat a.m. labs. right upper quadrant ultrasound showed cholelithiasis with gallbladder wall thickening without sonographic Murphy.  CBD not dilated.  Repeat a.m. labs.  Follow hepatitis panel  CAD Chronic combined heart failure -No chest pain.  Volume managed by dialysis.  Diabetes mellitus type 2 with hyperglycemia -Monitor blood sugars.  Continue linagliptin.  CBGs with SSI.  Goals of care -Palliative care consultation for goals of care discussion  DVT prophylaxis: Heparin subcutaneous Code Status: Full Family Communication: None at bedside Disposition Plan: Status is: Inpatient Remains inpatient appropriate because: Of severity of illness  Consultants: Nephrology/vascular surgery/orthopedics.  Palliative care  Procedures: None  Antimicrobials: Cefepime, vancomycin and Flagyl from 08/06/2022 onwards   Subjective: Patient seen and examined at bedside.  Denies worsening shortness of breath, fever, vomiting.    Objective: Vitals:   08/06/22 1829 08/06/22 1835 08/06/22 2045 08/07/22 0408  BP: 100/75 109/72 105/76 116/78  Pulse: 83 83 89   Resp: '18 18  13  '$ Temp:   98.5 F (36.9 C) 98.7 F (37.1 C)  TempSrc:   Oral Oral  SpO2: 99% 99% 99% 99%  Weight:      Height:        Intake/Output Summary (Last 24 hours) at 08/07/2022 0750 Last data filed at 08/06/2022 1839 Gross per 24 hour  Intake 301.5 ml  Output 2200 ml  Net -1898.5 ml    Filed Weights   08/05/22 1135 08/05/22 1444 08/05/22 1947  Weight: 90.3 kg 90.4 kg 89.4 kg    Examination:  General: On room air.  No distress.  Looks chronically ill and deconditioned. ENT/neck: No thyromegaly.  JVD is not elevated  respiratory: Decreased breath sounds at bases bilaterally with some crackles; no wheezing  CVS: S1-S2 heard, rate controlled currently Abdominal: Soft, nontender, slightly distended; no organomegaly,  bowel sounds are heard Extremities: Left lower extremity  dressing present; right AKA  CNS: Awake.  Slow to respond.  Poor historian.  No focal neurologic deficit.  Moves extremities Lymph: No obvious lymphadenopathy Skin: No obvious petechiae/rashes psych: Not agitated currently.  Affect is very flat.    Data Reviewed: I have personally reviewed following labs and imaging studies  CBC: Recent Labs  Lab 08/05/22 1240 08/05/22 1248 08/06/22 0344 08/07/22 0321  WBC 12.0*  --  10.5 9.3  NEUTROABS 9.7*  --   --  7.8*  HGB 11.1* 12.9* 9.5* 9.8*  HCT 36.3* 38.0* 29.4* 31.6*  MCV 98.1  --  93.3 94.6  PLT 270  --  219 053    Basic Metabolic Panel: Recent Labs  Lab 08/05/22 1240 08/05/22 1248 08/06/22 0344 08/07/22 0321  NA 139 135 133* 136  K 7.0* 7.0* 3.7 3.8  CL 92* 100 94* 96*  CO2 12*  --  22 22  GLUCOSE 105* 102* 148* 157*  BUN 71* 84* 36* 30*  CREATININE 8.84* 8.90* 4.83* 4.37*  CALCIUM 10.2  --  8.6* 8.6*  MG 2.8*  --   --   --     GFR: Estimated Creatinine Clearance: 22.3 mL/min (A) (by C-G formula based on SCr of 4.37 mg/dL (H)). Liver Function Tests: Recent Labs  Lab 08/05/22 1240 08/06/22 0344 08/07/22 0321  AST 140* 333* 451*  ALT 74* 155* 273*  ALKPHOS 166* 141* 132*  BILITOT 1.0 1.1 1.2  PROT 8.3* 6.8 6.5  ALBUMIN 3.0* 2.7* 2.4*    Recent Labs  Lab 08/05/22 1240  LIPASE 24    No results for input(s): "AMMONIA" in the last 168 hours. Coagulation Profile: No results for input(s): "INR", "PROTIME" in the last 168 hours. Cardiac Enzymes: Recent Labs  Lab 08/05/22 1318  CKTOTAL 221    BNP (last 3 results) No results for input(s): "PROBNP" in the last 8760 hours. HbA1C: No results for input(s): "HGBA1C" in the last 72 hours. CBG: Recent Labs  Lab 08/05/22 2002 08/06/22 0918 08/06/22 1205  GLUCAP 135* 209* 110*    Lipid Profile: No results for input(s): "CHOL", "HDL", "LDLCALC", "TRIG", "CHOLHDL", "LDLDIRECT" in the last 72 hours. Thyroid Function Tests: No results for input(s):  "TSH", "T4TOTAL", "FREET4", "T3FREE", "THYROIDAB" in the last 72 hours. Anemia Panel: No results for input(s): "VITAMINB12", "FOLATE", "FERRITIN", "TIBC", "IRON", "RETICCTPCT" in the last 72 hours. Sepsis Labs: Recent Labs  Lab 08/05/22 2105 08/06/22 0344 08/07/22 0321  PROCALCITON 11.58 13.54 10.91  LATICACIDVEN 7.1* 2.8*  --      Recent Results (from the past 240 hour(s))  Resp panel by RT-PCR (RSV, Flu A&B, Covid) Anterior Nasal Swab     Status: None   Collection Time: 08/05/22 12:39 PM   Specimen: Anterior Nasal Swab  Result Value Ref Range Status   SARS Coronavirus 2 by RT PCR NEGATIVE NEGATIVE Final    Comment: (NOTE) SARS-CoV-2 target nucleic acids are NOT DETECTED.  The SARS-CoV-2 RNA is generally detectable in upper respiratory specimens during the acute phase of infection. The lowest concentration of SARS-CoV-2 viral copies this assay can detect is 138 copies/mL. A negative result does not preclude SARS-Cov-2 infection and should not be used as the sole basis for treatment or other patient management decisions. A negative result may occur with  improper specimen collection/handling, submission of specimen other than nasopharyngeal swab, presence of viral mutation(s) within the areas targeted by this assay, and inadequate number of viral copies(<138 copies/mL).  A negative result must be combined with clinical observations, patient history, and epidemiological information. The expected result is Negative.  Fact Sheet for Patients:  EntrepreneurPulse.com.au  Fact Sheet for Healthcare Providers:  IncredibleEmployment.be  This test is no t yet approved or cleared by the [place] FDA and  has been authorized for detection and/or diagnosis of SARS-CoV-2 by FDA under an Emergency Use Authorization (EUA). This EUA will remain  in effect (meaning this test can be used) for the duration of the COVID-19 declaration under Section  564(b)(1) of the Act, 21 U.S.C.section 360bbb-3(b)(1), unless the authorization is terminated  or revoked sooner.       Influenza A by PCR NEGATIVE NEGATIVE Final   Influenza B by PCR NEGATIVE NEGATIVE Final    Comment: (NOTE) The Xpert Xpress SARS-CoV-2/FLU/RSV plus assay is intended as an aid in the diagnosis of influenza from Nasopharyngeal swab specimens and should not be used as a sole basis for treatment. Nasal washings and aspirates are unacceptable for Xpert Xpress SARS-CoV-2/FLU/RSV testing.  Fact Sheet for Patients: EntrepreneurPulse.com.au  Fact Sheet for Healthcare Providers: IncredibleEmployment.be  This test is not yet approved or cleared by the [place] FDA and has been authorized for detection and/or diagnosis of SARS-CoV-2 by FDA under an Emergency Use Authorization (EUA). This EUA will remain in effect (meaning this test can be used) for the duration of the COVID-19 declaration under Section 564(b)(1) of the Act, 21 U.S.C. section 360bbb-3(b)(1), unless the authorization is terminated or revoked.     Resp Syncytial Virus by PCR NEGATIVE NEGATIVE Final    Comment: (NOTE) Fact Sheet for Patients: EntrepreneurPulse.com.au  Fact Sheet for Healthcare Providers: IncredibleEmployment.be  This test is not yet approved or cleared by the [place] FDA and has been authorized for detection and/or diagnosis of SARS-CoV-2 by FDA under an Emergency Use Authorization (EUA). This EUA will remain in effect (meaning this test can be used) for the duration of the COVID-19 declaration under Section 564(b)(1) of the Act, 21 U.S.C. section 360bbb-3(b)(1), unless the authorization is terminated or revoked.  Performed at [facility], [place] [address], [place], [place] [address]   Culture, blood (Routine X 2) w Reflex to ID Panel     Status: None (Preliminary result)   Collection Time:  08/05/22  9:05 PM   Specimen: BLOOD RIGHT ARM  Result Value Ref Range Status   Specimen Description BLOOD RIGHT ARM  Final   Special Requests   Final    BOTTLES DRAWN AEROBIC AND ANAEROBIC Blood Culture adequate volume   Culture   Final    NO GROWTH 2 DAYS Performed at [facility], [place] [address], [place], [place] [address]    Report Status PENDING  Incomplete  Culture, blood (Routine X 2) w Reflex to ID Panel     Status: None (Preliminary result)   Collection Time: 08/05/22  9:05 PM   Specimen: BLOOD RIGHT ARM  Result Value Ref Range Status   Specimen Description BLOOD RIGHT ARM  Final   Special Requests   Final    BOTTLES DRAWN AEROBIC AND ANAEROBIC Blood Culture adequate volume   Culture   Final    NO GROWTH 2 DAYS Performed at [facility], [place] [address], [place], [place] [address]    Report Status PENDING  Incomplete  MRSA Next Gen by PCR, Nasal     Status: None   Collection Time: 08/06/22 12:50 AM   Specimen: Nasal Mucosa; Nasal Swab  Result Value Ref Range Status   MRSA by PCR Next Gen NOT DETECTED NOT DETECTED Final    Comment: (NOTE) The GeneXpert MRSA Assay (FDA approved for NASAL specimens only), is one component of a comprehensive MRSA colonization surveillance program. It is not intended to diagnose MRSA infection nor to guide or monitor treatment for MRSA infections. Test performance is not FDA approved in patients less than 26 years old. Performed at [facility], [place] [address] [place], [place] [address]          Radiology Studies: US Abdomen Limited RUQ (LIVER/GB)  Result Date: 08/06/2022 CLINICAL DATA:  Elevated LFT EXAM: ULTRASOUND ABDOMEN LIMITED RIGHT UPPER QUADRANT COMPARISON:  None Available. FINDINGS: Gallbladder: Small stones. Negative sonographic Murphy. Slight increased wall thickness at 4.7 mm. Common bile duct: Diameter: 6.6 mm Liver: Slightly echogenic liver parenchyma. No focal hepatic abnormality. Portal vein is  patent on color Doppler imaging with normal direction of blood flow towards the liver. Other: None. IMPRESSION: 1. Cholelithiasis with gallbladder wall thickening but negative sonographic Murphy. Upper normal common duct diameter. 2. Possible slightly echogenic liver parenchyma suggesting hepatic steatosis. Electronically Signed   By: Donavan Foil M.D.   On: 08/06/2022 22:41   DG Chest Portable 1 View  Result Date: 08/05/2022 CLINICAL DATA:  Weakness.  End-stage renal disease. EXAM: PORTABLE CHEST 1 VIEW COMPARISON:  05/10/2022 FINDINGS: The cardio pericardial silhouette is enlarged. Right IJ central line tip overlies the right atrium. The lungs are clear without focal pneumonia, edema, pneumothorax or pleural effusion. The visualized bony structures of the thorax are unremarkable. Telemetry leads overlie the chest. IMPRESSION: Enlargement of the cardiopericardial silhouette without acute cardiopulmonary findings. Electronically Signed   By: Misty Stanley M.D.   On: 08/05/2022 13:02        Scheduled Meds:  ALPRAZolam  0.25 mg Oral Daily   aspirin EC  81 mg Oral Daily   Chlorhexidine Gluconate Cloth  6 each Topical Q0600   Chlorhexidine Gluconate Cloth  6 each Topical Q0600   Chlorhexidine Gluconate Cloth  6 each Topical Q0600   clopidogrel  75 mg Oral Daily   gabapentin  100 mg Oral TID   heparin  5,000 Units Subcutaneous Q12H   insulin aspart  0-9 Units Subcutaneous TID WC   linaclotide  72 mcg Oral q AM   linagliptin  5 mg Oral Daily   pantoprazole  40 mg Oral Daily   sodium bicarbonate  650 mg Oral BID   sucroferric oxyhydroxide  1,000 mg Oral BID WC   Continuous Infusions:   ceFAZolin (ANCEF) IV     ceFEPime (MAXIPIME) IV     metronidazole 500 mg (08/07/22 6720)   [START ON 08/08/2022] vancomycin            Aline August, MD [facility] 08/07/2022, 7:50 AM

## 2022-08-07 NOTE — Transfer of Care (Signed)
Immediate Anesthesia Transfer of Care Note  Patient: Edward [name]  Procedure(s) Performed: LEFT ABOVE KNEE AMPUTATION (Left: Knee) APPLICATION OF WOUND VAC (Left: Leg Upper)  Patient Location: PACU  Anesthesia Type:General  Level of Consciousness: sedated and responds to stimulation  Airway & Oxygen Therapy: Patient Spontanous Breathing and Patient connected to face mask oxygen  Post-op Assessment: Report given to RN and Post -op Vital signs reviewed and stable  Post vital signs: stable  Last Vitals:  Vitals Value Taken Time  BP 95/69 08/07/22 1532  Temp    Pulse 85 08/07/22 1539  Resp 21 08/07/22 1539  SpO2 99 % 08/07/22 1539  Vitals shown include unvalidated device data.  Last Pain:  Vitals:   08/07/22 1225  TempSrc: Oral  PainSc:       Patients Stated Pain Goal: 0 (60/04/59 9774)  Complications: No notable events documented.

## 2022-08-07 NOTE — Interval H&P Note (Signed)
History and Physical Interval Note:  08/07/2022 11:54 AM  Edward [name]  has presented today for surgery, with the diagnosis of Gangrene Left Leg.  The various methods of treatment have been discussed with the patient and family. After consideration of risks, benefits and other options for treatment, the patient has consented to  Procedure(s): LEFT ABOVE KNEE AMPUTATION (Left) as a surgical intervention.  The patient's history has been reviewed, patient examined, no change in status, stable for surgery.  I have reviewed the patient's chart and labs.  Questions were answered to the patient's satisfaction.     [name]

## 2022-08-07 NOTE — Progress Notes (Signed)
   08/07/22 0816  Assess: MEWS Score  Temp 98.1 F (36.7 C)  BP 99/73  MAP (mmHg) 83  Pulse Rate 87  Resp 20  Level of Consciousness Alert  SpO2 100 %  O2 Device Room Air  Assess: MEWS Score  MEWS Temp 0  MEWS Systolic 1  MEWS Pulse 0  MEWS RR 0  MEWS LOC 0  MEWS Score 1  MEWS Score Color Green  Assess: if the MEWS score is Yellow or Red  Were vital signs taken at a resting state? Yes  Focused Assessment No change from prior assessment  Does the patient meet 2 or more of the SIRS criteria? No  MEWS guidelines implemented *See Row Information* Yes  Treat  MEWS Interventions Administered scheduled meds/treatments  Pain Scale 0-10  Pain Score 0  Patients Stated Pain Goal 0  Document  Patient Outcome Transferred/level of care increased  Assess: SIRS CRITERIA  SIRS Temperature  0  SIRS Pulse 0  SIRS Respirations  0  SIRS WBC 0  SIRS Score Sum  0

## 2022-08-07 NOTE — Plan of Care (Signed)

## 2022-08-07 NOTE — Op Note (Signed)
08/07/2022  3:20 PM  PATIENT:  Edward [name]    PRE-OPERATIVE DIAGNOSIS:  Gangrene Left Leg  POST-OPERATIVE DIAGNOSIS:  Same  PROCEDURE:  LEFT ABOVE KNEE AMPUTATION, APPLICATION OF WOUND VAC Application Kerecis micro graft 38 cm.  SURGEON:  [name], MD  PHYSICIAN ASSISTANT:None ANESTHESIA:   General  PREOPERATIVE INDICATIONS:  [name] is a  51 y.o. male with a diagnosis of Gangrene Left Leg who failed conservative measures and elected for surgical management.    The risks benefits and alternatives were discussed with the patient preoperatively including but not limited to the risks of infection, bleeding, nerve injury, cardiopulmonary complications, the need for revision surgery, among others, and the patient was willing to proceed.  OPERATIVE IMPLANTS: Kerecis micro graft 38 cm.  '@ENCIMAGES'$ @  OPERATIVE FINDINGS: Calcified arteries muscle had good contractility, were in consistency.  OPERATIVE PROCEDURE: Patient was brought the operating room and underwent a general anesthetic.  After adequate levels anesthesia were obtained patient's left lower extremity was prepped using DuraPrep draped into a sterile field was draped out of the sterile field with impervious stockinette.  A fishmouth incision was made just proximal to the patella.  This was carried down sharply to the muscles.  The vascular bundle medially was clamped and suture-ligated with 2-0 silk.  The reciprocating saw was used to complete the amputation and a large posterior flap was created.  Electrocautery was used for further hemostasis.  The wound was healthy and viable with no evidence of any ischemic changes.  The wound bed of 200 cm was covered with 38 cm of Kerecis micro graft.  The deep and superficial fascial layers were closed using #1 Vicryl the skin was closed using staples.  A customizable wound VAC was applied circumferentially this had a good suction fit patient was extubated taken the PACU in  stable condition.   DISCHARGE PLANNING:  Antibiotic duration: 24-hour antibiotics  Weightbearing: Transfer training  Pain medication: Opioid pathway  Dressing care/ Wound VAC: Wound VAC to continue for 1 week  Ambulatory devices: Transfer training  Discharge to: Discharge planning based on therapy recommendations.  Follow-up: In the office 1 week post operative.

## 2022-08-07 NOTE — Anesthesia Procedure Notes (Signed)
Procedure Name: LMA Insertion Date/Time: 08/07/2022 2:35 PM  Performed by: Mosetta Pigeon, CRNAPre-anesthesia Checklist: Patient identified, Emergency Drugs available, Suction available and Patient being monitored Patient Re-evaluated:Patient Re-evaluated prior to induction Oxygen Delivery Method: Circle System Utilized Preoxygenation: Pre-oxygenation with 100% oxygen Induction Type: IV induction Ventilation: Mask ventilation without difficulty LMA: LMA inserted LMA Size: 5.0 Number of attempts: 1 Placement Confirmation: positive ETCO2 and breath sounds checked- equal and bilateral Tube secured with: Tape Dental Injury: Teeth and Oropharynx as per pre-operative assessment

## 2022-08-07 NOTE — Anesthesia Preprocedure Evaluation (Signed)
Anesthesia Evaluation  Patient identified by MRN, date of birth, ID band Patient awake    Reviewed: Allergy & Precautions, NPO status , Patient's Chart, lab work & pertinent test results  History of Anesthesia Complications Negative for: history of anesthetic complications  Airway Mallampati: II  TM Distance: >3 FB Neck ROM: Full    Dental  (+) Dental Advisory Given, Missing   Pulmonary shortness of breath, sleep apnea (noncompliant) , former smoker   Pulmonary exam normal        Cardiovascular hypertension, + CAD, + Past MI, + Cardiac Stents, + Peripheral Vascular Disease and +CHF  Normal cardiovascular exam+ Valvular Problems/Murmurs MR    '22 TTE - EF 40 to 45%. Moderate LVH. Mild RV systolic dysfx. Mild AI. Severe MR. Mild PR. Severe TR. Trivial pericardial effusion.     Neuro/Psych  PSYCHIATRIC DISORDERS Anxiety Depression    TIA Neuromuscular disease    GI/Hepatic Neg liver ROS,GERD  Medicated and Controlled,,  Endo/Other  diabetes, Insulin Dependent    Renal/GU ESRF and DialysisRenal disease     Musculoskeletal  (+) Arthritis ,    Abdominal   Peds  Hematology  (+) Blood dyscrasia, anemia   Anesthesia Other Findings   Reproductive/Obstetrics                             Anesthesia Physical Anesthesia Plan  ASA: 4  Anesthesia Plan: General   Post-op Pain Management: Ketamine IV* and Tylenol PO (pre-op)*   Induction: Intravenous  PONV Risk Score and Plan: 1 and Treatment may vary due to age or medical condition, Ondansetron and Dexamethasone  Airway Management Planned: Oral ETT and LMA  Additional Equipment: None  Intra-op Plan:   Post-operative Plan: Extubation in OR  Informed Consent: I have reviewed the patients History and Physical, chart, labs and discussed the procedure including the risks, benefits and alternatives for the proposed anesthesia with the patient or  authorized representative who has indicated his/her understanding and acceptance.       Plan Discussed with: CRNA and Anesthesiologist  Anesthesia Plan Comments:         Anesthesia Quick Evaluation

## 2022-08-08 ENCOUNTER — Encounter (HOSPITAL_COMMUNITY): Payer: Self-pay | Admitting: Orthopedic Surgery

## 2022-08-08 DIAGNOSIS — Z515 Encounter for palliative care: Secondary | ICD-10-CM

## 2022-08-08 DIAGNOSIS — Z992 Dependence on renal dialysis: Secondary | ICD-10-CM

## 2022-08-08 DIAGNOSIS — N186 End stage renal disease: Secondary | ICD-10-CM

## 2022-08-08 DIAGNOSIS — E875 Hyperkalemia: Secondary | ICD-10-CM | POA: Diagnosis not present

## 2022-08-08 DIAGNOSIS — Z7189 Other specified counseling: Secondary | ICD-10-CM

## 2022-08-08 DIAGNOSIS — S78112A Complete traumatic amputation at level between left hip and knee, initial encounter: Secondary | ICD-10-CM | POA: Diagnosis not present

## 2022-08-08 DIAGNOSIS — I739 Peripheral vascular disease, unspecified: Secondary | ICD-10-CM | POA: Diagnosis not present

## 2022-08-08 LAB — BASIC METABOLIC PANEL
Anion gap: 16 — ABNORMAL HIGH (ref 5–15)
BUN: 48 mg/dL — ABNORMAL HIGH (ref 6–20)
CO2: 24 mmol/L (ref 22–32)
Calcium: 8.4 mg/dL — ABNORMAL LOW (ref 8.9–10.3)
Chloride: 95 mmol/L — ABNORMAL LOW (ref 98–111)
Creatinine, Ser: 6.44 mg/dL — ABNORMAL HIGH (ref 0.61–1.24)
GFR, Estimated: 10 mL/min — ABNORMAL LOW (ref >60)
Glucose, Bld: 195 mg/dL — ABNORMAL HIGH (ref 70–99)
Potassium: 4 mmol/L (ref 3.5–5.1)
Sodium: 135 mmol/L (ref 135–145)

## 2022-08-08 LAB — CBC WITH DIFFERENTIAL/PLATELET
Abs Immature Granulocytes: 0.1 10*3/uL — ABNORMAL HIGH (ref 0.00–0.07)
Basophils Absolute: 0 10*3/uL (ref 0.0–0.1)
Basophils Relative: 0 %
Eosinophils Absolute: 0.1 10*3/uL (ref 0.0–0.5)
Eosinophils Relative: 1 %
HCT: 28.8 % — ABNORMAL LOW (ref 39.0–52.0)
Hemoglobin: 8.9 g/dL — ABNORMAL LOW (ref 13.0–17.0)
Immature Granulocytes: 1 %
Lymphocytes Relative: 6 %
Lymphs Abs: 0.6 10*3/uL — ABNORMAL LOW (ref 0.7–4.0)
MCH: 29.4 pg (ref 26.0–34.0)
MCHC: 30.9 g/dL (ref 30.0–36.0)
MCV: 95 fL (ref 80.0–100.0)
Monocytes Absolute: 0.8 10*3/uL (ref 0.1–1.0)
Monocytes Relative: 9 %
Neutro Abs: 7.9 10*3/uL — ABNORMAL HIGH (ref 1.7–7.7)
Neutrophils Relative %: 83 %
Platelets: 176 10*3/uL (ref 150–400)
RBC: 3.03 MIL/uL — ABNORMAL LOW (ref 4.22–5.81)
RDW: 17.2 % — ABNORMAL HIGH (ref 11.5–15.5)
WBC: 9.5 10*3/uL (ref 4.0–10.5)
nRBC: 0.3 % — ABNORMAL HIGH (ref 0.0–0.2)

## 2022-08-08 LAB — HEPATIC FUNCTION PANEL
ALT: 138 U/L — ABNORMAL HIGH (ref 0–44)
AST: 113 U/L — ABNORMAL HIGH (ref 15–41)
Albumin: 2.3 g/dL — ABNORMAL LOW (ref 3.5–5.0)
Alkaline Phosphatase: 114 U/L (ref 38–126)
Bilirubin, Direct: 0.3 mg/dL — ABNORMAL HIGH (ref 0.0–0.2)
Indirect Bilirubin: 0.6 mg/dL (ref 0.3–0.9)
Total Bilirubin: 0.9 mg/dL (ref 0.3–1.2)
Total Protein: 6.4 g/dL — ABNORMAL LOW (ref 6.5–8.1)

## 2022-08-08 LAB — GLUCOSE, CAPILLARY
Glucose-Capillary: 177 mg/dL — ABNORMAL HIGH (ref 70–99)
Glucose-Capillary: 160 mg/dL — ABNORMAL HIGH (ref 70–99)

## 2022-08-08 MED ORDER — ALBUMIN HUMAN 25 % IV SOLN
25.0000 g | INTRAVENOUS | Status: AC | PRN
  Administered 2022-08-08 – 2022-08-10 (×2): 25 g via INTRAVENOUS
  Filled 2022-08-08 (×2): qty 100

## 2022-08-08 MED ORDER — ANTICOAGULANT SODIUM CITRATE 4% (200MG/5ML) IV SOLN
5.0000 mL | Status: DC | PRN
  Filled 2022-08-08: qty 5

## 2022-08-08 MED ORDER — HEPARIN SODIUM (PORCINE) 1000 UNIT/ML DIALYSIS: 1000.0000 [IU] | INTRAMUSCULAR | Status: DC | PRN

## 2022-08-08 MED ORDER — NALOXONE HCL 0.4 MG/ML IJ SOLN
INTRAMUSCULAR | Status: AC
  Administered 2022-08-08: 0.4 mg
  Filled 2022-08-08: qty 1

## 2022-08-08 MED ORDER — HEPARIN SODIUM (PORCINE) 1000 UNIT/ML IJ SOLN
INTRAMUSCULAR | Status: AC
  Administered 2022-08-08: 1000 [IU]
  Filled 2022-08-08: qty 1

## 2022-08-08 MED ORDER — MIDODRINE HCL 5 MG PO TABS
10.0000 mg | ORAL_TABLET | Freq: Once | ORAL | Status: AC
  Administered 2022-08-08: 10 mg via ORAL
  Filled 2022-08-08: qty 2

## 2022-08-08 MED ORDER — ALTEPLASE 2 MG IJ SOLR: 2.0000 mg | Freq: Once | INTRAMUSCULAR | Status: DC | PRN

## 2022-08-08 NOTE — Progress Notes (Addendum)
PROGRESS NOTE    Edward [name]  WEX:[id number] DOB: 1970-11-26 DOA: 08/05/2022 PCP: [name], MD   Brief Narrative:  51 y.o. male with medical history significant of ESRD on HD, PVD status post multiple amputations including right AKA, and multiple fingers amputation, and left transmetatarsal amputation, with chronic left leg and foot and left shin wound, IDDM, combined chronic combined CHF with LVEF 45%, chronic orthostatic hypotension, recurrent hyperkalemia, presented with worsening nausea, vomiting and dizziness.  On presentation, he was bradycardic and hypotensive: Blood pressure responded to a small IV bolus of 100 ml normal saline.  Potassium of 7, creatinine of 8.8.  EKG showed sinus bradycardia with T wave.  He was given Lokelma, D50 and insulin and underwent emergent hemodialysis.  Vascular surgery and orthopedic surgery were consulted.  He underwent left AKA on 08/07/2022 by orthopedics.  Assessment & Plan:   Hyperkalemia: Resolved End-stage renal disease on hemodialysis Acute metabolic acidosis -Presented with potassium of 7.  Required oral Lokelma, D50 and insulin and underwent emergent hemodialysis.  Dialysis as per nephrology schedule.   -Acidosis has resolved  SIRS Chronic left lower extremity nonhealing ulcers and left heel dry gangrene PVD with history of multiple amputations including AKA, multiple fingers amputation and left transmetatarsal amputation -Orthopedics and vascular surgery following.  Vascular surgery was of the opinion that patient does not have any revascularization options and recommended AKA.   -Status post AKA on 08/07/2022 by Dr. Sharol Given and currently has a wound VAC.  Wound care as per Dr. Jess Barters recommendations.  No further antibiotics necessary as per Dr. Sharol Given.  DC antibiotics today.  Lactic acidosis -Improving  Anemia of chronic disease -Possibly from renal failure.  Hemoglobin stable.  Monitor  intermittently  Leukocytosis -Resolved  Hyponatremia -Improved; managed by nephrology by dialysis  Acute transaminitis Cholelithiasis -Worsening.  Repeat a.m. labs. right upper quadrant ultrasound showed cholelithiasis with gallbladder wall thickening without sonographic Murphy.  CBD not dilated.  LFTs pending for today.  Hepatitis panel negative.  CAD Chronic combined heart failure -No chest pain.  Volume managed by dialysis.  Diabetes mellitus type 2 with hyperglycemia -Monitor blood sugars.  Continue linagliptin.  CBGs with SSI.  Goals of care -Palliative care consultation for goals of care discussion  DVT prophylaxis: Heparin subcutaneous Code Status: Full Family Communication: None at bedside Disposition Plan: Status is: Inpatient Remains inpatient appropriate because: Of severity of illness  Consultants: Nephrology/vascular surgery/orthopedics.  Palliative care  Procedures: Left AKA on 08/07/2022  Antimicrobials: Cefepime, vancomycin and Flagyl from 08/06/2022 onwards   Subjective: Patient seen and examined at bedside undergoing dialysis.  No fever, chest pain, worsening shortness of breath reported.  Complaints of left lower extremity pain. Objective: Vitals:   08/07/22 2325 08/08/22 0300 08/08/22 0500 08/08/22 0700  BP: 101/73 98/66  101/62  Pulse: 99 89  89  Resp:  (!) 30 (!) 25   Temp: 98.5 F (36.9 C) 97.9 F (36.6 C)  98.3 F (36.8 C)  TempSrc: Oral Oral  Oral  SpO2: 100% 99%  94%  Weight:      Height:        Intake/Output Summary (Last 24 hours) at 08/08/2022 0754 Last data filed at 08/07/2022 1525 Gross per 24 hour  Intake 500 ml  Output --  Net 500 ml    Filed Weights   08/05/22 1135 08/05/22 1444 08/05/22 1947  Weight: 90.3 kg 90.4 kg 89.4 kg    Examination:  General: No acute distress.  Currently on room air.  Looks chronically ill and deconditioned. ENT/neck: No obvious JVD elevation or palpable neck masses noted respiratory:  Bilateral decreased breath sounds at bases with some scattered crackles CVS: Rate mostly controlled; S1 and S2 are heard Abdominal: Soft, nontender, distended mildly; no organomegaly, bowel sounds heard  extremities: Left AKA with dressing and wound VAC present; right AKA  CNS: Still slow to respond.  Awake.  Poor historian.  No focal neurologic deficit.   Lymph: No palpable lymphadenopathy skin: No obvious ecchymosis/lesions psych: Extremely flat affect.  Not agitated    Data Reviewed: I have personally reviewed following labs and imaging studies  CBC: Recent Labs  Lab 08/05/22 1240 08/05/22 1248 08/06/22 0344 08/07/22 0321 08/08/22 0435  WBC 12.0*  --  10.5 9.3 9.5  NEUTROABS 9.7*  --   --  7.8* 7.9*  HGB 11.1* 12.9* 9.5* 9.8* 8.9*  HCT 36.3* 38.0* 29.4* 31.6* 28.8*  MCV 98.1  --  93.3 94.6 95.0  PLT 270  --  219 213 242    Basic Metabolic Panel: Recent Labs  Lab 08/05/22 1240 08/05/22 1248 08/06/22 0344 08/07/22 0321 08/08/22 0435  NA 139 135 133* 136 135  K 7.0* 7.0* 3.7 3.8 4.0  CL 92* 100 94* 96* 95*  CO2 12*  --  '22 22 24  '$ GLUCOSE 105* 102* 148* 157* 195*  BUN 71* 84* 36* 30* 48*  CREATININE 8.84* 8.90* 4.83* 4.37* 6.44*  CALCIUM 10.2  --  8.6* 8.6* 8.4*  MG 2.8*  --   --   --   --     GFR: Estimated Creatinine Clearance: 15.2 mL/min (A) (by C-G formula based on SCr of 6.44 mg/dL (H)). Liver Function Tests: Recent Labs  Lab 08/05/22 1240 08/06/22 0344 08/07/22 0321  AST 140* 333* 451*  ALT 74* 155* 273*  ALKPHOS 166* 141* 132*  BILITOT 1.0 1.1 1.2  PROT 8.3* 6.8 6.5  ALBUMIN 3.0* 2.7* 2.4*    Recent Labs  Lab 08/05/22 1240  LIPASE 24    No results for input(s): "AMMONIA" in the last 168 hours. Coagulation Profile: No results for input(s): "INR", "PROTIME" in the last 168 hours. Cardiac Enzymes: Recent Labs  Lab 08/05/22 1318  CKTOTAL 221    BNP (last 3 results) No results for input(s): "PROBNP" in the last 8760 hours. HbA1C: No  results for input(s): "HGBA1C" in the last 72 hours. CBG: Recent Labs  Lab 08/07/22 1031 08/07/22 1243 08/07/22 1534 08/07/22 2048 08/08/22 0703  GLUCAP 181* 132* 140* 172* 177*    Lipid Profile: No results for input(s): "CHOL", "HDL", "LDLCALC", "TRIG", "CHOLHDL", "LDLDIRECT" in the last 72 hours. Thyroid Function Tests: No results for input(s): "TSH", "T4TOTAL", "FREET4", "T3FREE", "THYROIDAB" in the last 72 hours. Anemia Panel: No results for input(s): "VITAMINB12", "FOLATE", "FERRITIN", "TIBC", "IRON", "RETICCTPCT" in the last 72 hours. Sepsis Labs: Recent Labs  Lab 08/05/22 2105 08/06/22 0344 08/07/22 0321  PROCALCITON 11.58 13.54 10.91  LATICACIDVEN 7.1* 2.8*  --      Recent Results (from the past 240 hour(s))  Resp panel by RT-PCR (RSV, Flu A&B, Covid) Anterior Nasal Swab     Status: None   Collection Time: 08/05/22 12:39 PM   Specimen: Anterior Nasal Swab  Result Value Ref Range Status   SARS Coronavirus 2 by RT PCR NEGATIVE NEGATIVE Final    Comment: (NOTE) SARS-CoV-2 target nucleic acids are NOT DETECTED.  The SARS-CoV-2 RNA is generally detectable in upper respiratory specimens during the acute phase of infection. The lowest concentration  of SARS-CoV-2 viral copies this assay can detect is 138 copies/mL. A negative result does not preclude SARS-Cov-2 infection and should not be used as the sole basis for treatment or other patient management decisions. A negative result may occur with  improper specimen collection/handling, submission of specimen other than nasopharyngeal swab, presence of viral mutation(s) within the areas targeted by this assay, and inadequate number of viral copies(<138 copies/mL). A negative result must be combined with clinical observations, patient history, and epidemiological information. The expected result is Negative.  Fact Sheet for Patients:  EntrepreneurPulse.com.au  Fact Sheet for Healthcare Providers:   IncredibleEmployment.be  This test is no t yet approved or cleared by the [place] FDA and  has been authorized for detection and/or diagnosis of SARS-CoV-2 by FDA under an Emergency Use Authorization (EUA). This EUA will remain  in effect (meaning this test can be used) for the duration of the COVID-19 declaration under Section 564(b)(1) of the Act, 21 U.S.C.section 360bbb-3(b)(1), unless the authorization is terminated  or revoked sooner.       Influenza A by PCR NEGATIVE NEGATIVE Final   Influenza B by PCR NEGATIVE NEGATIVE Final    Comment: (NOTE) The Xpert Xpress SARS-CoV-2/FLU/RSV plus assay is intended as an aid in the diagnosis of influenza from Nasopharyngeal swab specimens and should not be used as a sole basis for treatment. Nasal washings and aspirates are unacceptable for Xpert Xpress SARS-CoV-2/FLU/RSV testing.  Fact Sheet for Patients: EntrepreneurPulse.com.au  Fact Sheet for Healthcare Providers: IncredibleEmployment.be  This test is not yet approved or cleared by the [place] FDA and has been authorized for detection and/or diagnosis of SARS-CoV-2 by FDA under an Emergency Use Authorization (EUA). This EUA will remain in effect (meaning this test can be used) for the duration of the COVID-19 declaration under Section 564(b)(1) of the Act, 21 U.S.C. section 360bbb-3(b)(1), unless the authorization is terminated or revoked.     Resp Syncytial Virus by PCR NEGATIVE NEGATIVE Final    Comment: (NOTE) Fact Sheet for Patients: EntrepreneurPulse.com.au  Fact Sheet for Healthcare Providers: IncredibleEmployment.be  This test is not yet approved or cleared by the [place] FDA and has been authorized for detection and/or diagnosis of SARS-CoV-2 by FDA under an Emergency Use Authorization (EUA). This EUA will remain in effect (meaning this test can be used) for  the duration of the COVID-19 declaration under Section 564(b)(1) of the Act, 21 U.S.C. section 360bbb-3(b)(1), unless the authorization is terminated or revoked.  Performed at [facility], [place] [address] [place], [place] [address]   Culture, blood (Routine X 2) w Reflex to ID Panel     Status: None (Preliminary result)   Collection Time: 08/05/22  9:05 PM   Specimen: BLOOD RIGHT ARM  Result Value Ref Range Status   Specimen Description BLOOD RIGHT ARM  Final   Special Requests   Final    BOTTLES DRAWN AEROBIC AND ANAEROBIC Blood Culture adequate volume   Culture   Final    NO GROWTH 3 DAYS Performed at [facility], [place] [address], [place], [place] [address]    Report Status PENDING  Incomplete  Culture, blood (Routine X 2) w Reflex to ID Panel     Status: None (Preliminary result)   Collection Time: 08/05/22  9:05 PM   Specimen: BLOOD RIGHT ARM  Result Value Ref Range Status   Specimen Description BLOOD RIGHT ARM  Final   Special Requests   Final    BOTTLES  DRAWN AEROBIC AND ANAEROBIC Blood Culture adequate volume   Culture   Final    NO GROWTH 3 DAYS Performed at [facility], [place] [address], [place], [place] [address]    Report Status PENDING  Incomplete  MRSA Next Gen by PCR, Nasal     Status: None   Collection Time: 08/06/22 12:50 AM   Specimen: Nasal Mucosa; Nasal Swab  Result Value Ref Range Status   MRSA by PCR Next Gen NOT DETECTED NOT DETECTED Final    Comment: (NOTE) The GeneXpert MRSA Assay (FDA approved for NASAL specimens only), is one component of a comprehensive MRSA colonization surveillance program. It is not intended to diagnose MRSA infection nor to guide or monitor treatment for MRSA infections. Test performance is not FDA approved in patients less than 24 years old. Performed at [facility], [place] [address], [place], [place] [address]          Radiology Studies: US Abdomen Limited RUQ (LIVER/GB)  Result Date:  08/06/2022 CLINICAL DATA:  Elevated LFT EXAM: ULTRASOUND ABDOMEN LIMITED RIGHT UPPER QUADRANT COMPARISON:  None Available. FINDINGS: Gallbladder: Small stones. Negative sonographic Murphy. Slight increased wall thickness at 4.7 mm. Common bile duct: Diameter: 6.6 mm Liver: Slightly echogenic liver parenchyma. No focal hepatic abnormality. Portal vein is patent on color Doppler imaging with normal direction of blood flow towards the liver. Other: None. IMPRESSION: 1. Cholelithiasis with gallbladder wall thickening but negative sonographic Murphy. Upper normal common duct diameter. 2. Possible slightly echogenic liver parenchyma suggesting hepatic steatosis. Electronically Signed   By: Donavan Foil M.D.   On: 08/06/2022 22:41        Scheduled Meds:  ALPRAZolam  0.25 mg Oral Daily   vitamin C  1,000 mg Oral Daily   aspirin EC  81 mg Oral Daily   Chlorhexidine Gluconate Cloth  6 each Topical Q0600   Chlorhexidine Gluconate Cloth  6 each Topical Q0600   Chlorhexidine Gluconate Cloth  6 each Topical Q0600   Chlorhexidine Gluconate Cloth  6 each Topical Q0600   clopidogrel  75 mg Oral Daily   docusate sodium  100 mg Oral Daily   gabapentin  100 mg Oral TID   heparin  5,000 Units Subcutaneous Q12H   insulin aspart  0-9 Units Subcutaneous TID WC   linaclotide  72 mcg Oral q AM   linagliptin  5 mg Oral Daily   nutrition supplement (JUVEN)  1 packet Oral BID BM   pantoprazole  40 mg Oral Daily   sodium bicarbonate  650 mg Oral BID   sodium chloride flush  3 mL Intravenous Q12H   sucroferric oxyhydroxide  1,000 mg Oral BID WC   zinc sulfate  220 mg Oral Daily   Continuous Infusions:  sodium chloride     ceFEPime (MAXIPIME) IV 1 g (08/07/22 2027)   metronidazole 500 mg (08/08/22 0604)   vancomycin            Aline August, MD [facility] 08/08/2022, 7:54 AM

## 2022-08-08 NOTE — Anesthesia Postprocedure Evaluation (Signed)
Anesthesia Post Note  Patient: Edward [name]  Procedure(s) Performed: LEFT ABOVE KNEE AMPUTATION (Left: Knee) APPLICATION OF WOUND VAC (Left: Leg Upper)     Patient location during evaluation: PACU Anesthesia Type: General Level of consciousness: awake and alert Pain management: pain level controlled Vital Signs Assessment: post-procedure vital signs reviewed and stable Respiratory status: spontaneous breathing, nonlabored ventilation, respiratory function stable and patient connected to nasal cannula oxygen Cardiovascular status: blood pressure returned to baseline and stable Postop Assessment: no apparent nausea or vomiting Anesthetic complications: no  No notable events documented.  Last Vitals:  Vitals:   08/07/22 1710 08/07/22 2325  BP: 103/71 101/73  Pulse:  99  Resp: 20   Temp: 36.8 C 36.9 C  SpO2: 98% 100%    Last Pain:  Vitals:   08/07/22 2340  TempSrc:   PainSc: Asleep   Pain Goal: Patients Stated Pain Goal: 0 (08/07/22 0816)                 Oris Staffieri

## 2022-08-08 NOTE — Progress Notes (Signed)
   08/08/22 1330  Vitals  Temp 98.4 F (36.9 C)  Temp Source Oral  BP 98/79  MAP (mmHg) 87  BP Location Right Arm  BP Method Automatic  Patient Position (if appropriate) Lying  Pulse Rate 94  Pulse Rate Source Monitor  ECG Heart Rate 96  Resp 18  Oxygen Therapy  O2 Device Room Air ([place] removed, pt didn't come w/ O2, it was placed during RR)  Pulse Oximetry Type Continuous   Received patient in bed to unit.  Alert and oriented.  Informed consent signed and in chart.   Treatment initiated: 0911 Treatment completed: 1316  Patient tolerated well.  Transported back to the room  Alert, without acute distress.  Hand-off given to patient's nurse.   Access used: HD cath Access issues: NA  Total UF removed: 0 (kept even) Medication(s) given: Midodrine '10mg'$  po Albumin 25% 25 G, Heparin Dwells 3800 units Post HD VS: see above Post HD weight: 77.1kg   [facility]

## 2022-08-08 NOTE — Plan of Care (Signed)

## 2022-08-08 NOTE — Progress Notes (Signed)
PT Cancellation Note  Patient Details Name: Edward [name] MRN: [id number] DOB: [birth year]   Cancelled Treatment:    Reason Eval/Treat Not Completed: Fatigue/lethargy limiting ability to participate (Pt was in HD this AM until late afternoon. Pt states that he is fatigued and asked to defer physical therapy until tomorrow. Offered to come back later in the day and pt declined. Will continue to follow up as able and appropriate.)  Tomma Rakers, DPT, [place] Office: [phone] (Secure chat preferred)   Ander Purpura 08/08/2022, 3:26 PM

## 2022-08-08 NOTE — Progress Notes (Signed)
Patient ID: Edward [name], male   DOB: [birth year], 51 y.o.   MRN: [id number] Patient is postoperative day 1 left above-the-knee amputation.  The portable Praveena wound VAC pump is functioning well.  Patient may discharge when medically stable.  No further antibiotics necessary for the left leg.

## 2022-08-08 NOTE — Progress Notes (Signed)
IP rehab admissions - I met with patient at the bedside.  I explained CIR and I gave patient rehab booklets.  PT/OT evaluations and recommendations are pending.  Patient is not sure what kind of rehab he might need.  Patient hopes to DC home and have rehab at home.  Will follow up again for needs after seen by therapies.  Call me for questions.  [phone]

## 2022-08-08 NOTE — Consult Note (Signed)
Consultation Note Date: 08/08/2022   Patient Name: Edward [name]  DOB: [birth year]  MRN: [id number]  Age / Sex: 51 y.o., male  PCP: [name], MD Referring Physician: Aline August, MD  Reason for Consultation: Establishing goals of care  HPI/Patient Profile: 51 y.o. male  with past medical history of ESRD on HD, PVD status post multiple amputations including right AKA, and multiple fingers amputation, and left transmetatarsal amputation, with chronic left leg and foot and left shin wound, IDDM, combined chronic combined CHF with LVEF 45%, chronic orthostatic hypotension, and recurrent hyperkalemia admitted on 08/05/2022 with hyperkalemia and bradycardia.  He underwent emergent dialysis.  Also found to have worsening left leg gangrene.  Patient underwent left AKA on 12/20.  PMT consulted to discuss goals of care.  Clinical Assessment and Goals of Care: I have reviewed medical records including EPIC notes, labs and imaging, assessed the patient and then met with patient to discuss diagnosis prognosis, GOC, EOL wishes, disposition and options.  I introduced Palliative Medicine as specialized medical care for people living with serious illness. It focuses on providing relief from the symptoms and stress of a serious illness. The goal is to improve quality of life for both the patient and the family.  We discussed a brief life review of the patient.  Patient tells me he lives with his 2 sons.  As far as functional and nutritional status, he tells me at baseline he had a good set up at home.  He tells me his sons helped him a lot with tasks around the house.  He tells me he had a good appetite.  He tells me he was tolerating HD well.  He tells me he was wheelchair dependent.   We discussed patient's current illness and what it means in the larger context of patient's on-going co-morbidities.   I attempted to elicit values and goals of care important  to the patient.  Patient does not offer up much information about his values or goals.  He does tell me it is most important to him to be at home.  Advance directives, concepts specific to code status, artificial feeding and hydration, and rehospitalization were considered and discussed.  We reviewed a MOST form including CODE STATUS.  He does not commit to any decisions.  He does tell me he will consider this form and think about it later.  Discussed with patient the importance of continued conversation with family and the medical providers regarding overall plan of care and treatment options, ensuring decisions are within the context of the patients values and GOCs.    We discussed his meeting with CIR.  He tells me he does not remember this.  We discussed what CIR would entail.  He is unsure what he will do in continues to tell me he wants to be at home.  I attempted to ask patient about decision makers if he were ever unable to make decisions for himself.  Initially he will not state as he continuously states that he will make decisions for himself.  Asked him to consider scenario where he is unable and then who would be his decision maker.  He tells me he would want his sons to make decisions for him jointly.  They are next of kin.  He tells me he is having some postop pain.  We reviewed pain medications available to him.  He has not used much of his as needed dose.  Questions and concerns were addressed. The family was  encouraged to call with questions or concerns.  Primary Decision Maker PATIENT Sons to make decisions together if patient ever unable    SUMMARY OF RECOMMENDATIONS   Palliative care introduced to patient MOST form introduced though no decisions made Sons as decision-makers if patient ever unable To remain full code at this point  Code Status/Advance Care Planning: Full code     Primary Diagnoses: Present on Admission:  Hyperkalemia  Nausea and vomiting  PAD  (peripheral artery disease) (HCC)  PVD (peripheral vascular disease) ([place])  Diabetic foot ulcer ([place])  Transaminitis  Essential hypertension   I have reviewed the medical record, interviewed the patient and family, and examined the patient. The following aspects are pertinent.  Past Medical History:  Diagnosis Date   Allergy    Anemia    getting Venofer with dialysis   Anxiety    self reported, sometimes-situational anxiety   Arthritis    bilateral hands/LEFT hip   CHF (congestive heart failure) (HCC)    Chronic kidney disease    t th s dialysis- [place] ---ESRD   Coronary artery disease    Depression    self reported, sometimes-situational depression   Diabetes mellitus    type II- on meds   Dyspnea    sometimes- fluid   GERD (gastroesophageal reflux disease)    on meds   History of blood transfusion    Hypertension    no longer on medication since starting on Hemodialysis   Myocardial infarction ([place]) 2019   Peripheral vascular disease ([place])    Sleep apnea    does not use CPAP   Social History   Socioeconomic History   Marital status: Widowed    Spouse name: Not on file   Number of children: Not on file   Years of education: Not on file   Highest education level: Not on file  Occupational History   Occupation: Disabled  Tobacco Use   Smoking status: Former    Packs/day: 0.25    Years: 24.00    Total pack years: 6.00    Types: Cigarettes    Quit date: 11/19/2019    Years since quitting: 2.7   Smokeless tobacco: Never  Vaping Use   Vaping Use: Never used  Substance and Sexual Activity   Alcohol use: Not Currently    Alcohol/week: 0.0 standard drinks of alcohol    Comment: quit 2018   Drug use: No   Sexual activity: Not Currently    Birth control/protection: Condom  Other Topics Concern   Not on file  Social History Narrative   Lives w/ his 2 sons.   Social Determinants of Health   Financial Resource Strain: Not on file  Food Insecurity: Not on file   Transportation Needs: Not on file  Physical Activity: Not on file  Stress: Not on file  Social Connections: Not on file   Family History  Problem Relation Age of Onset   Diabetes Other    Pancreatic cancer Mother 86   Colon cancer Neg Hx    Colon polyps Neg Hx    Esophageal cancer Neg Hx    Rectal cancer Neg Hx    Stomach cancer Neg Hx    Scheduled Meds:  ALPRAZolam  0.25 mg Oral Daily   vitamin C  1,000 mg Oral Daily   aspirin EC  81 mg Oral Daily   Chlorhexidine Gluconate Cloth  6 each Topical Q0600   Chlorhexidine Gluconate Cloth  6 each Topical Q0600   Chlorhexidine Gluconate  Cloth  6 each Topical Q0600   Chlorhexidine Gluconate Cloth  6 each Topical Q0600   clopidogrel  75 mg Oral Daily   docusate sodium  100 mg Oral Daily   gabapentin  100 mg Oral TID   heparin  5,000 Units Subcutaneous Q12H   heparin sodium (porcine)       insulin aspart  0-9 Units Subcutaneous TID WC   linaclotide  72 mcg Oral q AM   linagliptin  5 mg Oral Daily   nutrition supplement (JUVEN)  1 packet Oral BID BM   pantoprazole  40 mg Oral Daily   sodium bicarbonate  650 mg Oral BID   sodium chloride flush  3 mL Intravenous Q12H   sucroferric oxyhydroxide  1,000 mg Oral BID WC   zinc sulfate  220 mg Oral Daily   Continuous Infusions:  sodium chloride     albumin human 25 g (08/08/22 0918)   PRN Meds:.sodium chloride, acetaminophen, albumin human, bisacodyl, bismuth subsalicylate, camphor-menthol, guaiFENesin-dextromethorphan, heparin sodium (porcine), hydrALAZINE, labetalol, metoprolol tartrate, morphine injection, ondansetron, mouth rinse, oxyCODONE, oxyCODONE, phenol, polyethylene glycol, sodium chloride flush Allergies  Allergen Reactions   Crestor [Rosuvastatin] Rash    Eye redness   Dilaudid [Hydromorphone] Itching   Robaxin [Methocarbamol] Other (See Comments)    Pt states medication made his arm pain worse.   Benadryl [Diphenhydramine] Rash   Review of Systems  Constitutional:   Positive for activity change and fatigue.  Musculoskeletal:        Leg pain postop  Neurological:  Positive for weakness.    Physical Exam Constitutional:      General: He is not in acute distress.    Appearance: He is ill-appearing.  Pulmonary:     Effort: Pulmonary effort is normal.  Skin:    General: Skin is warm and dry.  Neurological:     Mental Status: He is alert and oriented to person, place, and time.     Vital Signs: BP 105/75   Pulse 89   Temp 98.5 F (36.9 C) (Oral)   Resp 19   Ht _0  (1.854 m)   Wt 77.1 kg   SpO2 94%   BMI 22.43 kg/m  Pain Scale: 0-10   Pain Score: 8    SpO2: SpO2:  (UTA despite changing probe, checking cords, and wrapping hand in warm blanket) O2 Device:SpO2:  (UTA despite changing probe, checking cords, and wrapping hand in warm blanket) O2 Flow Rate: .O2 Flow Rate (L/min): 5 L/min  IO: Intake/output summary: No intake or output data in the 24 hours ending 08/08/22 1539  LBM: Last BM Date : 08/07/22 Baseline Weight: Weight: 90.3 kg Most recent weight: Weight: 77.1 kg     Palliative Assessment/Data: PPS 40%     *Please note that this is a verbal dictation therefore any spelling or grammatical errors are due to the "[employer]" system interpretation.   Juel Burrow, DNP, AGNP-C Palliative Medicine Team [phone] Pager: [phone]

## 2022-08-08 NOTE — Procedures (Signed)
I was present at this dialysis session. I have reviewed the session itself and made appropriate changes.  Pt initially was hypotensive and unresponsive this morning.  Rapid response called and given Narcan with improved BP and mental status.  Currently complaining of severe pain.  UF goal changed to keep even and will give IV albumin 25 mg and midodrine 10 mg x 1 and follow.  Vital signs in last 24 hours:  Temp:  [97.7 F (36.5 C)-99.2 F (37.3 C)] 98.3 F (36.8 C) (12/21 0700) Pulse Rate:  [85-106] 89 (12/21 0700) Resp:  [13-33] 15 (12/21 0820) BP: (82-113)/(62-80) 82/67 (12/21 0820) SpO2:  [94 %-100 %] 94 % (12/21 0700) Weight change:  Filed Weights   08/05/22 1135 08/05/22 1444 08/05/22 1947  Weight: 90.3 kg 90.4 kg 89.4 kg    Recent Labs  Lab 08/08/22 0435  NA 135  K 4.0  CL 95*  CO2 24  GLUCOSE 195*  BUN 48*  CREATININE 6.44*  CALCIUM 8.4*    Recent Labs  Lab 08/05/22 1240 08/05/22 1248 08/06/22 0344 08/07/22 0321 08/08/22 0435  WBC 12.0*  --  10.5 9.3 9.5  NEUTROABS 9.7*  --   --  7.8* 7.9*  HGB 11.1*   < > 9.5* 9.8* 8.9*  HCT 36.3*   < > 29.4* 31.6* 28.8*  MCV 98.1  --  93.3 94.6 95.0  PLT 270  --  219 213 176   < > = values in this interval not displayed.    Scheduled Meds:  ALPRAZolam  0.25 mg Oral Daily   vitamin C  1,000 mg Oral Daily   aspirin EC  81 mg Oral Daily   Chlorhexidine Gluconate Cloth  6 each Topical Q0600   Chlorhexidine Gluconate Cloth  6 each Topical Q0600   Chlorhexidine Gluconate Cloth  6 each Topical Q0600   Chlorhexidine Gluconate Cloth  6 each Topical Q0600   clopidogrel  75 mg Oral Daily   docusate sodium  100 mg Oral Daily   gabapentin  100 mg Oral TID   heparin  5,000 Units Subcutaneous Q12H   insulin aspart  0-9 Units Subcutaneous TID WC   linaclotide  72 mcg Oral q AM   linagliptin  5 mg Oral Daily   nutrition supplement (JUVEN)  1 packet Oral BID BM   pantoprazole  40 mg Oral Daily   sodium bicarbonate  650 mg Oral  BID   sodium chloride flush  3 mL Intravenous Q12H   sucroferric oxyhydroxide  1,000 mg Oral BID WC   zinc sulfate  220 mg Oral Daily   Continuous Infusions:  sodium chloride     PRN Meds:.sodium chloride, acetaminophen, bisacodyl, bismuth subsalicylate, camphor-menthol, guaiFENesin-dextromethorphan, hydrALAZINE, labetalol, metoprolol tartrate, morphine injection, ondansetron, mouth rinse, oxyCODONE, oxyCODONE, phenol, polyethylene glycol, sodium chloride flush   Donetta Potts,  MD 08/08/2022, 9:11 AM

## 2022-08-08 NOTE — PMR Pre-admission (Signed)
 PMR Admission Coordinator Pre-Admission Assessment   Patient: Edward [name] is an 51 y.o., male MRN: [id number] DOB: 07/08/1971 Height: 6' 1" (185.4 cm) Weight: 82 kg   Insurance Information HMO:  yes   PPO:      PCP:      IPA:      80/20:      OTHER: Group [id number] PRIMARY: UHC medicare      Policy#: [id number]      Subscriber: self CM Name:       Phone#: [phone]     Fax#: [phone] Pre-Cert#: [id number]      Employer: Disabled, not employed.. [employer] called with auth on 08/13/22 for admit 12/26-23 through 08/20/2022 Benefits:  Phone #: [phone]     Name:  Eff Date: 01/17/2022 - 08/18/2022 Deductible: $0 (does not have deductible) OOP Max: $3,600 ($3,600 met)  CIR: $295/day co-pay for days 1-5, $0/day co-pay for days 6+  SNF: $0.00 Copayment per day for days 1-20; $196.00 Copayment per day for days 21-39; $0.00 Copayment per day for days 40-100 for Medicare-covered care/maximum 100 days/benefit period  Outpatient: $10/visit co-pay  Home Health:  100% coverage; limited by medical necessity  DME: 80% coverage; 20% co-insurance  Providers: in network  SECONDARY: Medicaid [place]      Policy#: [id number]     Phone#: [phone]   Financial Counselor:       Phone#:    The "Data Collection Information Summary" for patients in Inpatient Rehabilitation Facilities with attached "Privacy Act Statement-Health Care Records" was provided and verbally reviewed with: Patient   Emergency Contact Information Contact Information       Name Relation Home Work Mobile    Dimaano,Jamal Son     [phone]    Loman,[name] Son [phone]   [phone]           Current Medical History  Patient Admitting Diagnosis: New L AKA, old R AKA   History of Present Illness::Tahmid L [name] 51-year-old right-handed male well-known to rehab services right below-knee amputation 06/19/2018 as well as left transmetatarsal amputation 05/10/2016 receiving inpatient rehab services 06/22/2018 -  07/01/2018 and has since undergone revision of right BKA to AKA 01/08/2022, multiple finger amputations,, quit smoking 2 years ago, chronic combined CHF with ejection fraction 45%, chronic orthostatic hypotension, end-stage renal disease with hemodialysis PVD maintained on aspirin and Plavix.  Per chart review patient lives with his 2 adult aged sons.  1 level home with one-step to entry.  He has a prosthesis for right leg but started using it yet.  Presented 08/05/2022 with gangrenous changes of the left lower extremity who failed conservative measures and underwent elective left AKA 08/07/2022 per Dr. Duda.  Wound VAC applied.  Hospital course hemodialysis ongoing as per renal services.  Acute on chronic anemia 9.5.  He was cleared to begin subcutaneous heparin for DVT prophylaxis.  Therapy evaluations completed due to patient decreased functional mobility was admitted for a comprehensive rehab program.     Patient's medical record from [facility] has been reviewed by the rehabilitation admission coordinator and physician.   Past Medical History      Past Medical History:  Diagnosis Date   Allergy     Anemia      getting Venofer with dialysis   Anxiety      self reported, sometimes-situational anxiety   Arthritis      bilateral hands/LEFT hip   CHF (congestive heart failure) (HCC)     Chronic kidney disease        t th s dialysis- [place] ---[facility]   Coronary artery disease     Depression      self reported, sometimes-situational depression   Diabetes mellitus      type II- on meds   Dyspnea      sometimes- fluid   GERD (gastroesophageal reflux disease)      on meds   History of blood transfusion     Hypertension      no longer on medication since starting on Hemodialysis   Myocardial infarction (HCC) 2019   Peripheral vascular disease (HCC)     Sleep apnea      does not use CPAP      Has the patient had major surgery during 100 days prior to admission? Yes   Family History    family history includes Diabetes in an other family member; Pancreatic cancer (age of onset: 78) in his mother.   Current Medications   Current Facility-Administered Medications:    (feeding supplement) PROSource Plus liquid 30 mL, 30 mL, Oral, BID BM, Brown, Rita Harbison, NP, 30 mL at 08/13/22 0832   0.9 %  sodium chloride infusion, 250 mL, Intravenous, PRN, Duda, Marcus V, MD   acetaminophen (TYLENOL) tablet 325-650 mg, 325-650 mg, Oral, Q6H PRN, Duda, Marcus V, MD, 650 mg at 08/11/22 2205   ALPRAZolam (XANAX) tablet 0.25 mg, 0.25 mg, Oral, Daily, Duda, Marcus V, MD, 0.25 mg at 08/13/22 0834   alteplase (CATHFLO ACTIVASE) injection 2 mg, 2 mg, Intracatheter, Once PRN, Brown, Rita Harbison, NP   anticoagulant sodium citrate solution 5 mL, 5 mL, Intracatheter, PRN, Brown, Rita Harbison, NP   ascorbic acid (VITAMIN C) tablet 1,000 mg, 1,000 mg, Oral, Daily, Duda, Marcus V, MD, 1,000 mg at 08/13/22 0834   aspirin EC tablet 81 mg, 81 mg, Oral, Daily, Duda, Marcus V, MD, 81 mg at 08/13/22 0832   bisacodyl (DULCOLAX) EC tablet 5 mg, 5 mg, Oral, Daily PRN, Duda, Marcus V, MD   bismuth subsalicylate (PEPTO BISMOL) 262 MG/15ML suspension 30 mL, 30 mL, Oral, Q6H PRN, Duda, Marcus V, MD   camphor-menthol (SARNA) lotion, , Topical, PRN, Duda, Marcus V, MD, Given at 08/06/22 0010   Chlorhexidine Gluconate Cloth 2 % PADS 6 each, 6 each, Topical, Q0600, Schertz, Robert, MD, 6 each at 08/13/22 0408   clopidogrel (PLAVIX) tablet 75 mg, 75 mg, Oral, Daily, Duda, Marcus V, MD, 75 mg at 08/13/22 0831   feeding supplement (NEPRO CARB STEADY) liquid 237 mL, 237 mL, Oral, BID BM, Brown, Rita Harbison, NP, 237 mL at 08/12/22 1057   gabapentin (NEURONTIN) capsule 100 mg, 100 mg, Oral, TID, Duda, Marcus V, MD, 100 mg at 08/13/22 0833   guaiFENesin-dextromethorphan (ROBITUSSIN DM) 100-10 MG/5ML syrup 15 mL, 15 mL, Oral, Q4H PRN, Duda, Marcus V, MD   heparin injection 1,000 Units, 1,000 Units, Intracatheter, PRN,  Brown, Rita Harbison, NP   [START ON 08/14/2022] heparin injection 3,000 Units, 3,000 Units, Dialysis, PRN, Schertz, Robert, MD   heparin injection 5,000 Units, 5,000 Units, Subcutaneous, Q12H, Duda, Marcus V, MD, 5,000 Units at 08/13/22 0930   insulin aspart (novoLOG) injection 0-9 Units, 0-9 Units, Subcutaneous, TID WC, Duda, Marcus V, MD, 1 Units at 08/13/22 0837   lidocaine (PF) (XYLOCAINE) 1 % injection 5 mL, 5 mL, Intradermal, PRN, Brown, Rita Harbison, NP   lidocaine-prilocaine (EMLA) cream 1 Application, 1 Application, Topical, PRN, Brown, Rita Harbison, NP   linaclotide (LINZESS) capsule 72 mcg, 72 mcg, Oral, q AM, Duda, Marcus V, MD, 72   mcg at 08/11/22 0611   linagliptin (TRADJENTA) tablet 5 mg, 5 mg, Oral, Daily, Duda, Marcus V, MD, 5 mg at 08/13/22 0833   midodrine (PROAMATINE) tablet 10 mg, 10 mg, Oral, TID WC, Brown, Rita Harbison, NP, 10 mg at 08/13/22 0831   morphine (PF) 2 MG/ML injection 2 mg, 2 mg, Intravenous, Q3H PRN, Duda, Marcus V, MD, 2 mg at 08/12/22 1544   nutrition supplement (JUVEN) (JUVEN) powder packet 1 packet, 1 packet, Oral, BID BM, Duda, Marcus V, MD, 1 packet at 08/13/22 0910   ondansetron (ZOFRAN) injection 4 mg, 4 mg, Intravenous, Q6H PRN, Duda, Marcus V, MD, 4 mg at 08/12/22 1118   Oral care mouth rinse, 15 mL, Mouth Rinse, PRN, Duda, Marcus V, MD   oxyCODONE (Oxy IR/ROXICODONE) immediate release tablet 5 mg, 5 mg, Oral, Q4H PRN, Krishnan, Gokul, MD, 5 mg at 08/13/22 0420   pantoprazole (PROTONIX) EC tablet 40 mg, 40 mg, Oral, Daily, Duda, Marcus V, MD, 40 mg at 08/13/22 0833   pentafluoroprop-tetrafluoroeth (GEBAUERS) aerosol 1 Application, 1 Application, Topical, PRN, Brown, Rita Harbison, NP   phenol (CHLORASEPTIC) mouth spray 1 spray, 1 spray, Mouth/Throat, PRN, Duda, Marcus V, MD   polyethylene glycol (MIRALAX / GLYCOLAX) packet 17 g, 17 g, Oral, Daily PRN, Duda, Marcus V, MD   sodium bicarbonate tablet 650 mg, 650 mg, Oral, BID, Duda, Marcus V, MD, 650 mg  at 08/13/22 0833   sodium chloride flush (NS) 0.9 % injection 3 mL, 3 mL, Intravenous, Q12H, Duda, Marcus V, MD, 3 mL at 08/13/22 0834   sodium chloride flush (NS) 0.9 % injection 3 mL, 3 mL, Intravenous, PRN, Duda, Marcus V, MD, 3 mL at 08/09/22 1056   sucroferric oxyhydroxide (VELPHORO) chewable tablet 1,000 mg, 1,000 mg, Oral, BID WC, Duda, Marcus V, MD, 1,000 mg at 08/13/22 0831   zinc sulfate capsule 220 mg, 220 mg, Oral, Daily, Duda, Marcus V, MD, 220 mg at 08/13/22 0832   Patients Current Diet:  Diet Order                  Diet renal with fluid restriction Fluid restriction: 1200 mL Fluid; Room service appropriate? Yes; Fluid consistency: Thin  Diet effective now                         Precautions / Restrictions Precautions Precautions: Fall Precaution Comments: previous R AKA Restrictions Weight Bearing Restrictions: Yes RUE Weight Bearing: Weight bear through elbow only (Pt reports NWB right hand due to recent finger amputation) RLE Weight Bearing: Non weight bearing LLE Weight Bearing: Non weight bearing Other Position/Activity Restrictions: Pt self reported NWB R hand -does have wound with packing R hand    Has the patient had 2 or more falls or a fall with injury in the past year? No   Prior Activity Level Limited Community (1-2x/wk): Went to HD [facility] and went to MD appointments.   Prior Functional Level Self Care: Did the patient need help bathing, dressing, using the toilet or eating? Needed some help   Indoor Mobility: Did the patient need assistance with walking from room to room (with or without device)? Independent   Stairs: Did the patient need assistance with internal or external stairs (with or without device)? Needed some help   Functional Cognition: Did the patient need help planning regular tasks such as shopping or remembering to take medications? Needed some help   Patient Information Are you of Hispanic, Latino/a,or Spanish origin?:   A. No, not  of Hispanic, Latino/a, or Spanish origin What is your race?: B. Black or African American Do you need or want an interpreter to communicate with a doctor or health care staff?: 0. No   Patient's Response To:  Health Literacy and Transportation Is the patient able to respond to health literacy and transportation needs?: Yes Health Literacy - How often do you need to have someone help you when you read instructions, pamphlets, or other written material from your doctor or pharmacy?: Often In the past 12 months, has lack of transportation kept you from medical appointments or from getting medications?: No In the past 12 months, has lack of transportation kept you from meetings, work, or from getting things needed for daily living?: No   Home Assistive Devices / Equipment Home Assistive Devices/Equipment: Prosthesis, Wheelchair Home Equipment: Rolling Walker (2 wheels), Wheelchair - manual, Shower seat, BSC/3in1, Cane - single point, Hand held shower head, Tub bench   Prior Device Use: Indicate devices/aids used by the patient prior to current illness, exacerbation or injury? Manual wheelchair, Walker, and Orthotics/Prosthetics   Current Functional Level Cognition   Overall Cognitive Status: Within Functional Limits for tasks assessed Orientation Level: Oriented X4    Extremity Assessment (includes Sensation/Coordination)   Upper Extremity Assessment: LUE deficits/detail, RUE deficits/detail RUE Deficits / Details: AV fistula. 2nd, 3rd, and 5th digit amputation. Pt reports NWB on right hand due packed wound. A/ROM WFL in shoulder, elbow, and wrist ranges. Shoulder flexion: 4-/5, abduction 4/5, IR/er: 4/5 Impaired functional grasp. RUE Coordination: decreased fine motor, decreased gross motor LUE Deficits / Details: 2nd, 4th, and 5th digit amputation. A/ROM WFL in shoulder, elbow, and wrist ranges. shoulder flexion: 4-/5, abduction: 3+/5, IR/er: 4-/5. Impaired functional gross grasp. LUE  Coordination: decreased fine motor, decreased gross motor  Lower Extremity Assessment: Defer to PT evaluation RLE Deficits / Details: Prior AKA; good ROM and at least 3/5 strength LLE Deficits / Details: New AKA; painful; ROM : no obvious contractures, limited by pain but has neutral hip ext; MMT: 1/5     ADLs   Overall ADL's : Needs assistance/impaired Eating/Feeding: Minimal assistance, Bed level Grooming: Wash/dry hands, Wash/dry face, Oral care, Minimal assistance, Sitting, Bed level Upper Body Bathing: Moderate assistance, Bed level, Sitting Lower Body Bathing: Total assistance, Bed level Upper Body Dressing : Moderate assistance, Sitting, Bed level Lower Body Dressing: Total assistance, Bed level Toilet Transfer: +2 for physical assistance, Total assistance, Anterior/posterior, BSC/3in1 Toilet Transfer Details (indicate cue type and reason): based on clinical judgement Toileting- Clothing Manipulation and Hygiene: Total assistance, Bed level     Mobility   Overal bed mobility: Needs Assistance Bed Mobility: Rolling Rolling: Mod assist Supine to sit: Mod assist, +2 for physical assistance Sit to supine: Min assist General bed mobility comments: increased time; cues to not push with R hand; multiple rolls during session for strengthening and bed straightening     Transfers   General transfer comment: Pt unable to tolerate today due to pain (received Neurotin prior to eval) also limited by not being able to weight bear in R hand.  Would require at least mod x 2 for anterior/posterior transfers     Ambulation / Gait / Stairs / Wheelchair Mobility   Ambulation/Gait General Gait Details: defer     Posture / Balance Dynamic Sitting Balance Sitting balance - Comments: prefers use of UE but could balance without support Balance Overall balance assessment: Needs assistance Sitting-balance support: Bilateral upper extremity supported, No upper extremity   supported Sitting balance-Leahy  Scale: Fair Sitting balance - Comments: prefers use of UE but could balance without support Standing balance comment: defer - bil AKA     Special needs/care consideration Dialysis: Hemodialysis Tuesday, Thursday, and Saturday, Wound Vac Yes L AKA post op incision area, Skin post op VAC and dressing to L AKA incision site, and Diabetic management Yes getting insulin in acute care setting.    Previous Home Environment (from acute therapy documentation) Living Arrangements: Children (2 adult sons) Available Help at Discharge: Family, Available 24 hours/day Type of Home: House Home Layout: One level Home Access: Stairs to enter Entrance Stairs-Rails: Right, Left Entrance Stairs-Number of Steps: 1 to get onto porch and 1 to get into house Bathroom Shower/Tub: Walk-in shower Bathroom Toilet: Handicapped height Bathroom Accessibility: Yes Home Care Services: No Additional Comments: Has prosthetic for R leg but has not started rehab with it yet.   Discharge Living Setting Plans for Discharge Living Setting: Patient's home, House, Lives with (comment) (His 2 adult sons live with patient.) Type of Home at Discharge: House Discharge Home Layout: One level Discharge Home Access: Stairs to enter Entrance Stairs-Rails: None Entrance Stairs-Number of Steps: 2 steps Discharge Bathroom Shower/Tub: Walk-in shower, Curtain Discharge Bathroom Toilet: Handicapped height Discharge Bathroom Accessibility: Yes How Accessible: Accessible via wheelchair, Accessible via walker Does the patient have any problems obtaining your medications?: No   Social/Family/Support Systems Patient Roles: Parent (Has 2 adult sons.) Contact Information: Jamal Kelly - son - [phone] Anticipated Caregiver: Jamal and Elijab - sons Anticipated Caregiver's Contact Information: [name] - son - [phone] Ability/Limitations of Caregiver: Eldest son works from home.  Younger son works from 5 pm to 5 am.  Sons cross cover so  patient has caregiver assistance. Caregiver Availability: 24/7 Discharge Plan Discussed with Primary Caregiver: Yes Is Caregiver In Agreement with Plan?: Yes Does Caregiver/Family have Issues with Lodging/Transportation while Pt is in Rehab?: No   Goals Patient/Family Goal for Rehab: PT/OT min assist goals Expected length of stay: 12-14 days Pt/Family Agrees to Admission and willing to participate: Yes Program Orientation Provided & Reviewed with Pt/Caregiver Including Roles  & Responsibilities: Yes   Decrease burden of Care through IP rehab admission:  N/A   Possible need for SNF placement upon discharge: Not planned   Patient Condition: I have reviewed medical records from [facility], spoken with CM, and patient. I met with patient at the bedside for inpatient rehabilitation assessment.  Patient will benefit from ongoing PT and OT, can actively participate in 3 hours of therapy a day 5 days of the week, and can make measurable gains during the admission.  Patient will also benefit from the coordinated team approach during an Inpatient Acute Rehabilitation admission.  The patient will receive intensive therapy as well as Rehabilitation physician, nursing, social worker, and care management interventions.  Due to bladder management, bowel management, safety, skin/wound care, disease management, medication administration, pain management, and patient education the patient requires 24 hour a day rehabilitation nursing.  The patient is currently max A with mobility and basic ADLs.  Discharge setting and therapy post discharge at home with home health is anticipated.  Patient has agreed to participate in the Acute Inpatient Rehabilitation Program and will admit today.   Preadmission Screen Completed By:  Laura B Staley, 08/13/2022 10:56 AM ______________________________________________________________________   Discussed status with Dr. Teesha Ohm  on 08/13/22 at 930 and received approval  for admission today.   Admission Coordinator:  Laura B Staley, CCC-SLP, time   1100/Date 08/13/22    Assessment/Plan: Diagnosis: AKA due to gangrene LLE Does the need for close, 24 hr/day Medical supervision in concert with the patient's rehab needs make it unreasonable for this patient to be served in a less intensive setting? Yes Co-Morbidities requiring supervision/potential complications: ESRD, anemia, hyponatremia, CAD< DM2, constipation Due to bladder management, bowel management, safety, skin/wound care, disease management, medication administration, pain management, and patient education, does the patient require 24 hr/day rehab nursing? Yes Does the patient require coordinated care of a physician, rehab nurse, PT, OT, and SLP to address physical and functional deficits in the context of the above medical diagnosis(es)? Yes Addressing deficits in the following areas: balance, endurance, locomotion, strength, transferring, bowel/bladder control, bathing, dressing, feeding, grooming, toileting, and psychosocial support Can the patient actively participate in an intensive therapy program of at least 3 hrs of therapy 5 days a week? Yes The potential for patient to make measurable gains while on inpatient rehab is good Anticipated functional outcomes upon discharge from inpatient rehab: min assist PT, min assist OT, min assist SLP Estimated rehab length of stay to reach the above functional goals is: 12-14 Anticipated discharge destination: Home 10. Overall Rehab/Functional Prognosis: good     MD Signature: Erby Sanderson       

## 2022-08-08 NOTE — Significant Event (Signed)
Rapid Response Event Note   Reason for Call :  lethargy  Initial Focused Assessment:  Patient will arouse, but he falls quickly back asleep.   BP 82/67  HR 59  RR 15  unable to obtain o2 sat Lung sounds clear Heart tones regular    Interventions:  0.'4mg'$  Narcan given slowly IV  Reassessment: He is awake and in pain, Left leg and back.  He is restless and talkative BP 122/84  HR  58  RR 18  Plan of Care:  Start HD, keep even   Event Summary:   MD Notified: Vladimir Faster PA at bedside Call Time: [place] Time: 0820 End Time: 0900  Raliegh Ip, RN

## 2022-08-09 DIAGNOSIS — R7401 Elevation of levels of liver transaminase levels: Secondary | ICD-10-CM | POA: Diagnosis not present

## 2022-08-09 DIAGNOSIS — I10 Essential (primary) hypertension: Secondary | ICD-10-CM

## 2022-08-09 DIAGNOSIS — E875 Hyperkalemia: Secondary | ICD-10-CM | POA: Diagnosis not present

## 2022-08-09 DIAGNOSIS — N186 End stage renal disease: Secondary | ICD-10-CM | POA: Diagnosis not present

## 2022-08-09 LAB — CBC WITH DIFFERENTIAL/PLATELET
Abs Immature Granulocytes: 0.12 10*3/uL — ABNORMAL HIGH (ref 0.00–0.07)
Basophils Absolute: 0.1 10*3/uL (ref 0.0–0.1)
Basophils Relative: 0 %
Eosinophils Absolute: 0.2 10*3/uL (ref 0.0–0.5)
Eosinophils Relative: 1 %
HCT: 28.1 % — ABNORMAL LOW (ref 39.0–52.0)
Hemoglobin: 8.9 g/dL — ABNORMAL LOW (ref 13.0–17.0)
Immature Granulocytes: 1 %
Lymphocytes Relative: 8 %
Lymphs Abs: 0.9 10*3/uL (ref 0.7–4.0)
MCH: 30.1 pg (ref 26.0–34.0)
MCHC: 31.7 g/dL (ref 30.0–36.0)
MCV: 94.9 fL (ref 80.0–100.0)
Monocytes Absolute: 1.2 10*3/uL — ABNORMAL HIGH (ref 0.1–1.0)
Monocytes Relative: 10 %
Neutro Abs: 9.2 10*3/uL — ABNORMAL HIGH (ref 1.7–7.7)
Neutrophils Relative %: 80 %
Platelets: 154 10*3/uL (ref 150–400)
RBC: 2.96 MIL/uL — ABNORMAL LOW (ref 4.22–5.81)
RDW: 17.6 % — ABNORMAL HIGH (ref 11.5–15.5)
WBC: 11.6 10*3/uL — ABNORMAL HIGH (ref 4.0–10.5)
nRBC: 0.4 % — ABNORMAL HIGH (ref 0.0–0.2)

## 2022-08-09 LAB — COMPREHENSIVE METABOLIC PANEL
ALT: 59 U/L — ABNORMAL HIGH (ref 0–44)
AST: 48 U/L — ABNORMAL HIGH (ref 15–41)
Albumin: 2.5 g/dL — ABNORMAL LOW (ref 3.5–5.0)
Alkaline Phosphatase: 102 U/L (ref 38–126)
Anion gap: 18 — ABNORMAL HIGH (ref 5–15)
BUN: 30 mg/dL — ABNORMAL HIGH (ref 6–20)
CO2: 22 mmol/L (ref 22–32)
Calcium: 8.7 mg/dL — ABNORMAL LOW (ref 8.9–10.3)
Chloride: 97 mmol/L — ABNORMAL LOW (ref 98–111)
Creatinine, Ser: 4.72 mg/dL — ABNORMAL HIGH (ref 0.61–1.24)
GFR, Estimated: 14 mL/min — ABNORMAL LOW (ref >60)
Glucose, Bld: 156 mg/dL — ABNORMAL HIGH (ref 70–99)
Potassium: 4.5 mmol/L (ref 3.5–5.1)
Sodium: 137 mmol/L (ref 135–145)
Total Bilirubin: 1.1 mg/dL (ref 0.3–1.2)
Total Protein: 6.4 g/dL — ABNORMAL LOW (ref 6.5–8.1)

## 2022-08-09 LAB — GLUCOSE, CAPILLARY
Glucose-Capillary: 158 mg/dL — ABNORMAL HIGH (ref 70–99)
Glucose-Capillary: 184 mg/dL — ABNORMAL HIGH (ref 70–99)
Glucose-Capillary: 185 mg/dL — ABNORMAL HIGH (ref 70–99)
Glucose-Capillary: 174 mg/dL — ABNORMAL HIGH (ref 70–99)

## 2022-08-09 LAB — SURGICAL PATHOLOGY

## 2022-08-09 LAB — PHOSPHORUS: Phosphorus: 4.9 mg/dL — ABNORMAL HIGH (ref 2.5–4.6)

## 2022-08-09 MED ORDER — PROSOURCE PLUS PO LIQD
30.0000 mL | Freq: Two times a day (BID) | ORAL | Status: DC
  Administered 2022-08-09 – 2022-08-13 (×8): 30 mL via ORAL
  Filled 2022-08-09 (×8): qty 30

## 2022-08-09 MED ORDER — NEPRO/CARBSTEADY PO LIQD
237.0000 mL | Freq: Two times a day (BID) | ORAL | Status: DC
  Administered 2022-08-11 – 2022-08-13 (×3): 237 mL via ORAL
  Filled 2022-08-09: qty 237

## 2022-08-09 NOTE — Progress Notes (Signed)
Inpatient Rehab Admissions Coordinator:    I spoke with Pt. To discuss potential CIR admit. He is interested. Sons can provide 24/7 support. I will open case with insurance once therapy notes are in.   Clemens Catholic, [place], [place] Admissions Coordinator  [phone] ([place]) [phone] (office)

## 2022-08-09 NOTE — Evaluation (Signed)
Occupational Therapy Evaluation Patient Details Name: Edward [name] MRN: [id number] DOB: 1971/02/17 Today's Date: 08/09/2022   History of Present Illness Pt is 51 yo male admitted 08/05/22 with hyperkalemia, SIRS due to L heel gangrene.  Pt now s/p L AKA on 08/07/22.  Pt with hx of ESRD on HD, PVD with multiple amputation including R AKA, now L AKA, multiple fingers, DM, CHF, orthostatic hypotension, recurrent hyperkalemia   Clinical Impression   Pt in bed upon therapy arrival and agreeable to participate in OT evaluation. Prior to admit, pt was able to complete lateral scoot transfers with assist from his Sons. He was able to transfer into shower to complete bathing with assist, as well perform w/c seated push up to assist Sons with LB dressing. Currently, pt is presenting with decreased strength, activity tolerance, and endurance and increased pain requiring increased physical assist to complete BADL and functional transfers. Recommend d/c to CIR to focus on mentioned deficits and increase pt's functional performance during BADL tasks in order to return home with assist from [place]. Acute OT will follow patient acutely.       Recommendations for follow up therapy are one component of a multi-disciplinary discharge planning process, led by the attending physician.  Recommendations may be updated based on patient status, additional functional criteria and insurance authorization.   Follow Up Recommendations  Acute inpatient rehab (3hours/day)     Assistance Recommended at Discharge Intermittent Supervision/Assistance  Patient can return home with the following Two people to help with walking and/or transfers;Two people to help with bathing/dressing/bathroom;Help with stairs or ramp for entrance;Assistance with feeding;Assist for transportation    Functional Status Assessment  Patient has had a recent decline in their functional status and demonstrates the ability to make significant  improvements in function in a reasonable and predictable amount of time.  Equipment Recommendations  Other (comment) (TBD)    Recommendations for Other Services Rehab consult     Precautions / Restrictions Precautions Precautions: Fall Precaution Comments: previous R AKA Restrictions Weight Bearing Restrictions: Yes RUE Weight Bearing: Weight bear through elbow only (Pt reports NWB right hand due to recent finger amputation) RLE Weight Bearing: Non weight bearing LLE Weight Bearing: Non weight bearing Other Position/Activity Restrictions: Pt self reported NWB R hand -does have wound with packing R hand      Mobility Bed Mobility Overal bed mobility: Needs Assistance Bed Mobility: Rolling Rolling: Mod assist   Supine to sit: Mod assist, +2 for physical assistance Sit to supine: Min assist   General bed mobility comments: increased time; cues to not push with R hand; multiple rolls during session for strengthening and bed straightening Patient Response: Cooperative  Transfers     General transfer comment: Pt unable to tolerate today due to pain (received Neurotin prior to eval) also limited by not being able to weight bear in R hand.  Would require at least mod x 2 for anterior/posterior transfers      Balance Overall balance assessment: Needs assistance Sitting-balance support: Bilateral upper extremity supported, No upper extremity supported Sitting balance-Leahy Scale: Fair Sitting balance - Comments: prefers use of UE but could balance without support       Standing balance comment: defer - bil AKA           ADL either performed or assessed with clinical judgement   ADL Overall ADL's : Needs assistance/impaired Eating/Feeding: Minimal assistance;Bed level   Grooming: Wash/dry hands;Wash/dry face;Oral care;Minimal assistance;Sitting;Bed level   Upper Body Bathing: Moderate assistance;Bed  level;Sitting   Lower Body Bathing: Total assistance;Bed level    Upper Body Dressing : Moderate assistance;Sitting;Bed level   Lower Body Dressing: Total assistance;Bed level   Toilet Transfer: +2 for physical assistance;Total assistance;Anterior/posterior;BSC/3in1 Armed forces technical officer Details (indicate cue type and reason): based on clinical judgement Toileting- Clothing Manipulation and Hygiene: Total assistance;Bed level               Vision Baseline Vision/History: 0 No visual deficits Ability to See in Adequate Light: 0 Adequate Patient Visual Report: No change from baseline Vision Assessment?: No apparent visual deficits            Pertinent Vitals/Pain Pain Assessment Pain Assessment: 0-10 Pain Score: 7  Pain Location: L surgical site Pain Descriptors / Indicators: Discomfort Pain Intervention(s): Limited activity within patient's tolerance, Monitored during session, Repositioned     Hand Dominance Right   Extremity/Trunk Assessment Upper Extremity Assessment Upper Extremity Assessment: LUE deficits/detail;RUE deficits/detail RUE Deficits / Details: AV fistula. 2nd, 3rd, and 5th digit amputation. Pt reports NWB on right hand due packed wound. A/ROM WFL in shoulder, elbow, and wrist ranges. Shoulder flexion: 4-/5, abduction 4/5, IR/er: 4/5 Impaired functional grasp. RUE Coordination: decreased fine motor;decreased gross motor LUE Deficits / Details: 2nd, 4th, and 5th digit amputation. A/ROM WFL in shoulder, elbow, and wrist ranges. shoulder flexion: 4-/5, abduction: 3+/5, IR/er: 4-/5. Impaired functional gross grasp. LUE Coordination: decreased fine motor;decreased gross motor   Lower Extremity Assessment Lower Extremity Assessment: Defer to PT evaluation RLE Deficits / Details: Prior AKA; good ROM and at least 3/5 strength LLE Deficits / Details: New AKA; painful; ROM : no obvious contractures, limited by pain but has neutral hip ext; MMT: 1/5   Cervical / Trunk Assessment Cervical / Trunk Assessment: Normal   Communication  Communication Communication: No difficulties   Cognition Arousal/Alertness: Awake/alert Behavior During Therapy: WFL for tasks assessed/performed Overall Cognitive Status: Within Functional Limits for tasks assessed           General Comments  Pt with history of orthostaic BP. BP monitored during session: 99/70 supine, 95/77 seated EOB, 104/77 seated EOB            Home Living Family/patient expects to be discharged to:: Private residence Living Arrangements: Children (2 adult sons) Available Help at Discharge: Family;Available 24 hours/day Type of Home: House Home Access: Stairs to enter [employer] of Steps: 1 to get onto porch and 1 to get into house Entrance Stairs-Rails: Right;Left Home Layout: One level     Bathroom Shower/Tub: Occupational psychologist: Handicapped height Bathroom Accessibility: Yes   Home Equipment: Conservation officer, nature (2 wheels);Wheelchair - manual;Shower seat;BSC/3in1;Cane - single point;Hand held shower head;Tub bench   Additional Comments: Has prosthetic for R leg but has not started rehab with it yet.      Prior Functioning/Environment Prior Level of Function : Needs assist       Physical Assist : Mobility (physical);ADLs (physical) Mobility (physical): Transfers ADLs (physical): Dressing;Bathing;Toileting;Grooming Mobility Comments: Primarily does lateral scoots for transfers  without sliding board; reports normally could scoot on his own but as he has gotten sicker his sons have assisted ADLs Comments: Reports sons assisted with ADLs (pt does chair push up and sons pull up pants). Took showers using tub bench.        OT Problem List: Decreased strength;Pain;Decreased safety awareness;Decreased activity tolerance;Impaired balance (sitting and/or standing);Impaired UE functional use;Decreased knowledge of use of DME or AE      OT Treatment/Interventions: Self-care/ADL training;Therapeutic  exercise;Therapeutic  activities;Neuromuscular education;Energy conservation;DME and/or AE instruction;Manual therapy;Patient/family education;Balance training;Modalities    OT Goals(Current goals can be found in the care plan section) Acute Rehab OT Goals Patient Stated Goal: to get stronger OT Goal Formulation: With patient Time For Goal Achievement: 08/22/22 Potential to Achieve Goals: Good  OT Frequency: Min 2X/week    Co-evaluation PT/OT/SLP Co-Evaluation/Treatment: Yes Reason for Co-Treatment: Complexity of the patient's impairments (multi-system involvement);To address functional/ADL transfers   OT goals addressed during session: ADL's and self-care;Proper use of Adaptive equipment and DME;Strengthening/ROM      AM-PAC OT "6 Clicks" Daily Activity     Outcome Measure Help from another person eating meals?: A Little Help from another person taking care of personal grooming?: A Little Help from another person toileting, which includes using toliet, bedpan, or urinal?: Total Help from another person bathing (including washing, rinsing, drying)?: A Lot Help from another person to put on and taking off regular upper body clothing?: A Little Help from another person to put on and taking off regular lower body clothing?: A Lot 6 Click Score: 14   End of Session    Activity Tolerance: Patient tolerated treatment well;Patient limited by fatigue Patient left: in bed;with call bell/phone within reach;with bed alarm set  OT Visit Diagnosis: Muscle weakness (generalized) (M62.81)                Time: 5809-9833 OT Time Calculation (min): 26 min Charges:  OT General Charges $OT Visit: 1 Visit OT Evaluation $OT Eval High Complexity: 1 High  [employer], OTR/L,CBIS  Supplemental OT - [facility] and [facility]   Shakedra Beam, Clarene Duke 08/09/2022, 1:53 PM

## 2022-08-09 NOTE — Progress Notes (Signed)
Patient ID: Edward [name], male   DOB: 29-Oct-1970, 51 y.o.   MRN: [id number] Patient is status post left above-the-knee amputation.  The wound VAC has a good suction fit.  Patient will discharge with the Praveena plus portable wound VAC pump I will follow-up in the office 1 week after discharge.  Patient has no complaints at this time.

## 2022-08-09 NOTE — Progress Notes (Signed)
Pt receives out-pt HD at [facility] on [address]. Will assist as needed.   Teya Otterson Renal Navigator [phone] 

## 2022-08-09 NOTE — Plan of Care (Signed)

## 2022-08-09 NOTE — Evaluation (Signed)
Physical Therapy Evaluation Patient Details Name: Edward [name] MRN: [id number] DOB: 06/30/1971 Today's Date: 08/09/2022  History of Present Illness  Pt is 51 yo male admitted 08/05/22 with hyperkalemia, SIRS due to L heel gangrene.  Pt now s/p L AKA on 08/07/22.  Pt with hx of ESRD on HD, PVD with multiple amputation including R AKA, now L AKA, multiple fingers, DM, CHF, orthostatic hypotension, recurrent hyperkalemia  Clinical Impression  Pt admitted with above diagnosis. At baseline, pt reports lateral scoots to chair.  States he has needed more help recently due to L LE pain and R UE wound.  Pt has prosthetic for R LE but has not started with therapy yet due to other complications.  Pt now with bil AKA.  He required mod A x 2 for bed mobility.  Pt attempted scooting up in bed but unable due to pain and having to use R UE elbow.  Pt would prefer go home at d/c but at this time requires +2 assist.  He does have support from sons so could hopefully progress to transfers with assist of 1.  Do recommend rehab until pt able to progress.  Pt currently with functional limitations due to the deficits listed below (see PT Problem List). Pt will benefit from skilled PT to increase their independence and safety with mobility to allow discharge to the venue listed below.          Recommendations for follow up therapy are one component of a multi-disciplinary discharge planning process, led by the attending physician.  Recommendations may be updated based on patient status, additional functional criteria and insurance authorization.  Follow Up Recommendations Acute inpatient rehab (3hours/day)      Assistance Recommended at Discharge Frequent or constant Supervision/Assistance  Patient can return home with the following  A lot of help with walking and/or transfers;A lot of help with bathing/dressing/bathroom;Assistance with cooking/housework;Help with stairs or ramp for entrance    Equipment  Recommendations Other (comment) (pt reports working on power w/c)  Recommendations for Other Services  Rehab consult    Functional Status Assessment Patient has had a recent decline in their functional status and demonstrates the ability to make significant improvements in function in a reasonable and predictable amount of time.     Precautions / Restrictions Precautions Precautions: Fall Restrictions Weight Bearing Restrictions: Yes RUE Weight Bearing: Weight bear through elbow only (Pt self reports NWB R hand) LLE Weight Bearing: Non weight bearing Other Position/Activity Restrictions: Pt self reported NWB R hand -does have wound with packing R hand      Mobility  Bed Mobility Overal bed mobility: Needs Assistance Bed Mobility: Rolling Rolling: Mod assist   Supine to sit: Mod assist, +2 for physical assistance Sit to supine: Min assist   General bed mobility comments: increased time; cues to not push with R hand; multiple rolls during session for strengthening and bed straightening    Transfers                   General transfer comment: Pt unable to tolerate today due to pain (received Neurotin prior to eval) also limited by not being able to weight bear in R hand.  Would require at least mod x 2 for anterior/posterior transfers    Ambulation/Gait               General Gait Details: defer  Stairs            Wheelchair Mobility    Modified  Rankin (Stroke Patients Only)       Balance Overall balance assessment: Needs assistance Sitting-balance support: Bilateral upper extremity supported, No upper extremity supported Sitting balance-Leahy Scale: Fair Sitting balance - Comments: prefers use of UE but could balance without support       Standing balance comment: defer - bil AKA                             Pertinent Vitals/Pain Pain Assessment Pain Assessment: 0-10 Pain Score: 7  Pain Location: L surgical site Pain  Descriptors / Indicators: Discomfort Pain Intervention(s): Limited activity within patient's tolerance, Monitored during session    Home Living Family/patient expects to be discharged to:: Private residence Living Arrangements: Children (2 sons) Available Help at Discharge: Family;Available 24 hours/day Type of Home: House   Entrance Stairs-Rails: Right;Left Entrance Stairs-Number of Steps: 1 to get onto porch and 1 to get into house   Home Layout: One level Home Equipment: Conservation officer, nature (2 wheels);Wheelchair - manual;Shower seat;BSC/3in1;Cane - single point;Hand held shower head Additional Comments: Has prosthetic for R leg but has not started rehab with it yet.    Prior Function Prior Level of Function : Needs assist             Mobility Comments: Primarily does lateral scoots for transfers  without sliding board; reports normally could scoot on his own but as he has gotten sicker his sons have assisted ADLs Comments: Reports sons assisted with ADLs (pt does chair push up and sons pull up pants)     Hand Dominance   Dominant Hand: Right    Extremity/Trunk Assessment   Upper Extremity Assessment Upper Extremity Assessment: Defer to OT evaluation (weak UE, several finger amputations, L UE AV fistula)    Lower Extremity Assessment Lower Extremity Assessment: LLE deficits/detail;RLE deficits/detail RLE Deficits / Details: Prior AKA; good ROM and at least 3/5 strength LLE Deficits / Details: New AKA; painful; ROM : no obvious contractures, limited by pain but has neutral hip ext; MMT: 1/5    Cervical / Trunk Assessment Cervical / Trunk Assessment: Normal  Communication   Communication: No difficulties  Cognition Arousal/Alertness: Awake/alert Behavior During Therapy: WFL for tasks assessed/performed Overall Cognitive Status: Within Functional Limits for tasks assessed                                          General Comments General comments (skin  integrity, edema, etc.): pt with hx of orthostatic bp : bp soft but stable during session (90's/70's supine and sit)    Exercises     Assessment/Plan    PT Assessment Patient needs continued PT services  PT Problem List Decreased strength;Cardiopulmonary status limiting activity;Decreased range of motion;Decreased activity tolerance;Decreased knowledge of use of DME;Decreased balance;Decreased safety awareness;Decreased mobility;Decreased knowledge of precautions       PT Treatment Interventions DME instruction;Therapeutic exercise;Wheelchair mobility training;Balance training;Neuromuscular re-education;Modalities;Functional mobility training;Therapeutic activities;Patient/family education    PT Goals (Current goals can be found in the Care Plan section)  Acute Rehab PT Goals Patient Stated Goal: home if able PT Goal Formulation: With patient Time For Goal Achievement: 08/23/22 Potential to Achieve Goals: Good    Frequency Min 4X/week     Co-evaluation PT/OT/SLP Co-Evaluation/Treatment: Yes Reason for Co-Treatment: Complexity of the patient's impairments (multi-system involvement);For patient/therapist safety  AM-PAC PT "6 Clicks" Mobility  Outcome Measure Help needed turning from your back to your side while in a flat bed without using bedrails?: A Lot Help needed moving from lying on your back to sitting on the side of a flat bed without using bedrails?: Total Help needed moving to and from a bed to a chair (including a wheelchair)?: Total Help needed standing up from a chair using your arms (e.g., wheelchair or bedside chair)?: Total Help needed to walk in hospital room?: Total Help needed climbing 3-5 steps with a railing? : Total 6 Click Score: 7    End of Session   Activity Tolerance: Patient limited by pain Patient left: in bed;with call bell/phone within reach;Other (comment) (orders to elevate L LE - elevated with pillow under buttock and leg so as not  to position in hip flexion) Nurse Communication: Mobility status PT Visit Diagnosis: Other abnormalities of gait and mobility (R26.89);Muscle weakness (generalized) (M62.81)    Time: 9179-1505 PT Time Calculation (min) (ACUTE ONLY): 32 min   Charges:   PT Evaluation $PT Eval Moderate Complexity: 1 Mod          Anslee Micheletti, PT Acute Rehab [facility] [phone]   Karlton Lemon 08/09/2022, 1:18 PM

## 2022-08-09 NOTE — Progress Notes (Signed)
PROGRESS NOTE    Edward [name]  JME:[id number] DOB: 09-Oct-1970 DOA: 08/05/2022 PCP: [name], MD   Brief Narrative:  51 y.o. male with medical history significant of ESRD on HD, PVD status post multiple amputations including right AKA, and multiple fingers amputation, and left transmetatarsal amputation, with chronic left leg and foot and left shin wound, IDDM, combined chronic combined CHF with LVEF 45%, chronic orthostatic hypotension, recurrent hyperkalemia, presented with worsening nausea, vomiting and dizziness.  On presentation, he was bradycardic and hypotensive: Blood pressure responded to a small IV bolus of 100 ml normal saline.  Potassium of 7, creatinine of 8.8.  EKG showed sinus bradycardia with T wave.  He was given Lokelma, D50 and insulin and underwent emergent hemodialysis.  Vascular surgery and orthopedic surgery were consulted.  He underwent left AKA on 08/07/2022 by orthopedics.  Assessment & Plan:   Hyperkalemia: Resolved End-stage renal disease on hemodialysis Acute metabolic acidosis -Presented with potassium of 7.  Required oral Lokelma, D50 and insulin and underwent emergent hemodialysis.  Dialysis as per nephrology schedule.   -Acidosis has resolved  SIRS Chronic left lower extremity nonhealing ulcers and left heel dry gangrene PVD with history of multiple amputations including AKA, multiple fingers amputation and left transmetatarsal amputation -Vascular surgery was of the opinion that patient does not have any revascularization options and recommended AKA.   -Status post AKA on 08/07/2022 by Dr. Sharol Given and currently has a wound VAC.  Wound care as per Dr. Jess Barters recommendations.  Antibiotics have already been discontinued. -PT eval pending  Lactic acidosis -Improving  Anemia of chronic disease -Possibly from renal failure.  Hemoglobin stable.  Monitor intermittently  Leukocytosis -Mild.  Monitor  Hyponatremia -Improved; managed by nephrology by  dialysis  Acute transaminitis Cholelithiasis -Improving significantly.  Repeat a.m. labs. right upper quadrant ultrasound showed cholelithiasis with gallbladder wall thickening without sonographic Murphy.  CBD not dilated.  Hepatitis panel negative.  CAD Chronic combined heart failure -No chest pain.  Volume managed by dialysis.  Diabetes mellitus type 2 with hyperglycemia -Monitor blood sugars.  Continue linagliptin.  CBGs with SSI.  Goals of care -Palliative care consultation appreciated: Currently remains full code  DVT prophylaxis: Heparin subcutaneous Code Status: Full Family Communication: None at bedside Disposition Plan: Status is: Inpatient Remains inpatient appropriate because: Of severity of illness.  Need for PT eval.  Possible discharge in 1 to 2 days if remains stable and tolerates PT   Consultants: Nephrology/vascular surgery/orthopedics.  Palliative care  Procedures: Left AKA on 08/07/2022  Antimicrobials: Cefepime, vancomycin and Flagyl from 08/06/2022-08/07/2022  Subjective: Patient seen and examined at bedside.  Denies worsening shortness of breath, fever, vomiting  objective: Vitals:   08/08/22 1700 08/08/22 1937 08/08/22 2055 08/09/22 0525  BP: 97/63 (!) 89/70 96/74 111/76  Pulse: 99     Resp: 17  14   Temp: 98.2 F (36.8 C)     TempSrc: Oral     SpO2: 97% 97%    Weight:      Height:        Intake/Output Summary (Last 24 hours) at 08/09/2022 0831 Last data filed at 08/08/2022 1316 Gross per 24 hour  Intake --  Output 0 ml  Net 0 ml    Filed Weights   08/05/22 1947 08/08/22 0854 08/08/22 1330  Weight: 89.4 kg 77.1 kg 77.1 kg    Examination:  General: On room air.  No distress.  Looks chronically ill and deconditioned. ENT/neck: No obvious thyromegaly or JVD elevation noted  respiratory: Decreased breath sounds at bases laterally with scattered crackles CVS: S1-S2 heard; rate controlled mostly Abdominal: Soft, nontender, slightly  distended; no organomegaly, bowel sounds are heard  extremities: Right AKA present; left AKA with dressing and wound VAC CNS: Alert, slow to respond.  poor historian.  No obvious focal neurologic deficit.   Lymph: No cervical lymphadenopathy  skin: No petechiae or rashes  psych: No signs of agitation.  Affect is flat    Data Reviewed: I have personally reviewed following labs and imaging studies  CBC: Recent Labs  Lab 08/05/22 1240 08/05/22 1248 08/06/22 0344 08/07/22 0321 08/08/22 0435 08/09/22 0315  WBC 12.0*  --  10.5 9.3 9.5 11.6*  NEUTROABS 9.7*  --   --  7.8* 7.9* 9.2*  HGB 11.1* 12.9* 9.5* 9.8* 8.9* 8.9*  HCT 36.3* 38.0* 29.4* 31.6* 28.8* 28.1*  MCV 98.1  --  93.3 94.6 95.0 94.9  PLT 270  --  219 213 176 229    Basic Metabolic Panel: Recent Labs  Lab 08/05/22 1240 08/05/22 1248 08/06/22 0344 08/07/22 0321 08/08/22 0435 08/09/22 0315  NA 139 135 133* 136 135 137  K 7.0* 7.0* 3.7 3.8 4.0 4.5  CL 92* 100 94* 96* 95* 97*  CO2 12*  --  '22 22 24 22  '$ GLUCOSE 105* 102* 148* 157* 195* 156*  BUN 71* 84* 36* 30* 48* 30*  CREATININE 8.84* 8.90* 4.83* 4.37* 6.44* 4.72*  CALCIUM 10.2  --  8.6* 8.6* 8.4* 8.7*  MG 2.8*  --   --   --   --   --     GFR: Estimated Creatinine Clearance: 20 mL/min (A) (by C-G formula based on SCr of 4.72 mg/dL (H)). Liver Function Tests: Recent Labs  Lab 08/05/22 1240 08/06/22 0344 08/07/22 0321 08/08/22 1401 08/09/22 0315  AST 140* 333* 451* 113* 48*  ALT 74* 155* 273* 138* 59*  ALKPHOS 166* 141* 132* 114 102  BILITOT 1.0 1.1 1.2 0.9 1.1  PROT 8.3* 6.8 6.5 6.4* 6.4*  ALBUMIN 3.0* 2.7* 2.4* 2.3* 2.5*    Recent Labs  Lab 08/05/22 1240  LIPASE 24    No results for input(s): "AMMONIA" in the last 168 hours. Coagulation Profile: No results for input(s): "INR", "PROTIME" in the last 168 hours. Cardiac Enzymes: Recent Labs  Lab 08/05/22 1318  CKTOTAL 221    BNP (last 3 results) No results for input(s): "PROBNP" in the  last 8760 hours. HbA1C: No results for input(s): "HGBA1C" in the last 72 hours. CBG: Recent Labs  Lab 08/07/22 1534 08/07/22 2048 08/08/22 0703 08/08/22 2144 08/09/22 0824  GLUCAP 140* 172* 177* 160* 158*    Lipid Profile: No results for input(s): "CHOL", "HDL", "LDLCALC", "TRIG", "CHOLHDL", "LDLDIRECT" in the last 72 hours. Thyroid Function Tests: No results for input(s): "TSH", "T4TOTAL", "FREET4", "T3FREE", "THYROIDAB" in the last 72 hours. Anemia Panel: No results for input(s): "VITAMINB12", "FOLATE", "FERRITIN", "TIBC", "IRON", "RETICCTPCT" in the last 72 hours. Sepsis Labs: Recent Labs  Lab 08/05/22 2105 08/06/22 0344 08/07/22 0321  PROCALCITON 11.58 13.54 10.91  LATICACIDVEN 7.1* 2.8*  --      Recent Results (from the past 240 hour(s))  Resp panel by RT-PCR (RSV, Flu A&B, Covid) Anterior Nasal Swab     Status: None   Collection Time: 08/05/22 12:39 PM   Specimen: Anterior Nasal Swab  Result Value Ref Range Status   SARS Coronavirus 2 by RT PCR NEGATIVE NEGATIVE Final    Comment: (NOTE) SARS-CoV-2 target nucleic acids are  NOT DETECTED.  The SARS-CoV-2 RNA is generally detectable in upper respiratory specimens during the acute phase of infection. The lowest concentration of SARS-CoV-2 viral copies this assay can detect is 138 copies/mL. A negative result does not preclude SARS-Cov-2 infection and should not be used as the sole basis for treatment or other patient management decisions. A negative result may occur with  improper specimen collection/handling, submission of specimen other than nasopharyngeal swab, presence of viral mutation(s) within the areas targeted by this assay, and inadequate number of viral copies(<138 copies/mL). A negative result must be combined with clinical observations, patient history, and epidemiological information. The expected result is Negative.  Fact Sheet for Patients:  EntrepreneurPulse.com.au  Fact Sheet  for Healthcare Providers:  IncredibleEmployment.be  This test is no t yet approved or cleared by the [place] FDA and  has been authorized for detection and/or diagnosis of SARS-CoV-2 by FDA under an Emergency Use Authorization (EUA). This EUA will remain  in effect (meaning this test can be used) for the duration of the COVID-19 declaration under Section 564(b)(1) of the Act, 21 U.S.C.section 360bbb-3(b)(1), unless the authorization is terminated  or revoked sooner.       Influenza A by PCR NEGATIVE NEGATIVE Final   Influenza B by PCR NEGATIVE NEGATIVE Final    Comment: (NOTE) The Xpert Xpress SARS-CoV-2/FLU/RSV plus assay is intended as an aid in the diagnosis of influenza from Nasopharyngeal swab specimens and should not be used as a sole basis for treatment. Nasal washings and aspirates are unacceptable for Xpert Xpress SARS-CoV-2/FLU/RSV testing.  Fact Sheet for Patients: EntrepreneurPulse.com.au  Fact Sheet for Healthcare Providers: IncredibleEmployment.be  This test is not yet approved or cleared by the [place] FDA and has been authorized for detection and/or diagnosis of SARS-CoV-2 by FDA under an Emergency Use Authorization (EUA). This EUA will remain in effect (meaning this test can be used) for the duration of the COVID-19 declaration under Section 564(b)(1) of the Act, 21 U.S.C. section 360bbb-3(b)(1), unless the authorization is terminated or revoked.     Resp Syncytial Virus by PCR NEGATIVE NEGATIVE Final    Comment: (NOTE) Fact Sheet for Patients: EntrepreneurPulse.com.au  Fact Sheet for Healthcare Providers: IncredibleEmployment.be  This test is not yet approved or cleared by the [place] FDA and has been authorized for detection and/or diagnosis of SARS-CoV-2 by FDA under an Emergency Use Authorization (EUA). This EUA will remain in effect (meaning  this test can be used) for the duration of the COVID-19 declaration under Section 564(b)(1) of the Act, 21 U.S.C. section 360bbb-3(b)(1), unless the authorization is terminated or revoked.  Performed at [facility], [place] [address], [place], [place] [address]   Culture, blood (Routine X 2) w Reflex to ID Panel     Status: None (Preliminary result)   Collection Time: 08/05/22  9:05 PM   Specimen: BLOOD RIGHT ARM  Result Value Ref Range Status   Specimen Description BLOOD RIGHT ARM  Final   Special Requests   Final    BOTTLES DRAWN AEROBIC AND ANAEROBIC Blood Culture adequate volume   Culture   Final    NO GROWTH 3 DAYS Performed at [facility], [place] [address], [place], [place] [address]    Report Status PENDING  Incomplete  Culture, blood (Routine X 2) w Reflex to ID Panel     Status: None (Preliminary result)   Collection Time: 08/05/22  9:05 PM   Specimen: BLOOD RIGHT ARM  Result Value Ref  Range Status   Specimen Description BLOOD RIGHT ARM  Final   Special Requests   Final    BOTTLES DRAWN AEROBIC AND ANAEROBIC Blood Culture adequate volume   Culture   Final    NO GROWTH 3 DAYS Performed at [facility], [address], [place], [place] [address]    Report Status PENDING  Incomplete  MRSA Next Gen by PCR, Nasal     Status: None   Collection Time: 08/06/22 12:50 AM   Specimen: Nasal Mucosa; Nasal Swab  Result Value Ref Range Status   MRSA by PCR Next Gen NOT DETECTED NOT DETECTED Final    Comment: (NOTE) The GeneXpert MRSA Assay (FDA approved for NASAL specimens only), is one component of a comprehensive MRSA colonization surveillance program. It is not intended to diagnose MRSA infection nor to guide or monitor treatment for MRSA infections. Test performance is not FDA approved in patients less than 35 years old. Performed at [facility], [place] [address] [place], [place] [address]          Radiology Studies: No results  found.      Scheduled Meds:  ALPRAZolam  0.25 mg Oral Daily   vitamin C  1,000 mg Oral Daily   aspirin EC  81 mg Oral Daily   Chlorhexidine Gluconate Cloth  6 each Topical Q0600   Chlorhexidine Gluconate Cloth  6 each Topical Q0600   Chlorhexidine Gluconate Cloth  6 each Topical Q0600   Chlorhexidine Gluconate Cloth  6 each Topical Q0600   clopidogrel  75 mg Oral Daily   docusate sodium  100 mg Oral Daily   gabapentin  100 mg Oral TID   heparin  5,000 Units Subcutaneous Q12H   insulin aspart  0-9 Units Subcutaneous TID WC   linaclotide  72 mcg Oral q AM   linagliptin  5 mg Oral Daily   nutrition supplement (JUVEN)  1 packet Oral BID BM   pantoprazole  40 mg Oral Daily   sodium bicarbonate  650 mg Oral BID   sodium chloride flush  3 mL Intravenous Q12H   sucroferric oxyhydroxide  1,000 mg Oral BID WC   zinc sulfate  220 mg Oral Daily   Continuous Infusions:  sodium chloride     albumin human 25 g (08/08/22 6468)          Aline August, MD [facility] 08/09/2022, 8:31 AM

## 2022-08-09 NOTE — Care Management Important Message (Signed)
Important Message  Patient Details  Name: Edward [name] MRN: [id number] Date of Birth: 12-01-1970   Medicare Important Message Given:  Yes     Hannah Beat 08/09/2022, 12:47 PM

## 2022-08-09 NOTE — Progress Notes (Addendum)
[facility] Progress Note   Subjective: Seen in bed lying totally flat! Says he is comfortable. Wound vac intact L AKA. HD 08/08/2022 Net UF 0. He is now Bilateral AKA so need to watch for volume excess carefully.   Objective Vitals:   08/08/22 1700 08/08/22 1937 08/08/22 2055 08/09/22 0525  BP: 97/63 (!) 89/70 96/74 111/76  Pulse: 99     Resp: 17  14   Temp: 98.2 F (36.8 C)     TempSrc: Oral     SpO2: 97% 97%    Weight:      Height:       Physical Exam General: Chronically ill appearing male in  NAD  Heart: SR 1st degree AVB on monitor.Rate 90s. S1,S2 no M/R/G Lungs: CTAB A/P Abdomen: soft, NABS Extremities: wound vac L AKA, no stump edema R AKA Amputated fingers both hands.  Dialysis Access: RIJ TDC drsg intact.   Additional Objective Labs: Basic Metabolic Panel: Recent Labs  Lab 08/07/22 0321 08/08/22 0435 08/09/22 0315  NA 136 135 137  K 3.8 4.0 4.5  CL 96* 95* 97*  CO2 '22 24 22  '$ GLUCOSE 157* 195* 156*  BUN 30* 48* 30*  CREATININE 4.37* 6.44* 4.72*  CALCIUM 8.6* 8.4* 8.7*   Liver Function Tests: Recent Labs  Lab 08/07/22 0321 08/08/22 1401 08/09/22 0315  AST 451* 113* 48*  ALT 273* 138* 59*  ALKPHOS 132* 114 102  BILITOT 1.2 0.9 1.1  PROT 6.5 6.4* 6.4*  ALBUMIN 2.4* 2.3* 2.5*   Recent Labs  Lab 08/05/22 1240  LIPASE 24   CBC: Recent Labs  Lab 08/05/22 1240 08/05/22 1248 08/06/22 0344 08/07/22 0321 08/08/22 0435 08/09/22 0315  WBC 12.0*  --  10.5 9.3 9.5 11.6*  NEUTROABS 9.7*  --   --  7.8* 7.9* 9.2*  HGB 11.1*   < > 9.5* 9.8* 8.9* 8.9*  HCT 36.3*   < > 29.4* 31.6* 28.8* 28.1*  MCV 98.1  --  93.3 94.6 95.0 94.9  PLT 270  --  219 213 176 154   < > = values in this interval not displayed.   Blood Culture    Component Value Date/Time   SDES BLOOD RIGHT ARM 08/05/2022 2105   SDES BLOOD RIGHT ARM 08/05/2022 2105   SPECREQUEST  08/05/2022 2105    BOTTLES DRAWN AEROBIC AND ANAEROBIC Blood Culture adequate volume    SPECREQUEST  08/05/2022 2105    BOTTLES DRAWN AEROBIC AND ANAEROBIC Blood Culture adequate volume   CULT  08/05/2022 2105    NO GROWTH 3 DAYS Performed at [facility], [place] [address] [place], [place] [address]    CULT  08/05/2022 2105    NO GROWTH 3 DAYS Performed at [facility] [address], [place], [place] [address]    REPTSTATUS PENDING 08/05/2022 2105   REPTSTATUS PENDING 08/05/2022 2105    Cardiac Enzymes: Recent Labs  Lab 08/05/22 1318  CKTOTAL 221   CBG: Recent Labs  Lab 08/07/22 1534 08/07/22 2048 08/08/22 0703 08/08/22 2144 08/09/22 0824  GLUCAP 140* 172* 177* 160* 158*   Iron Studies: No results for input(s): "IRON", "TIBC", "TRANSFERRIN", "FERRITIN" in the last 72 hours. '@lablastinr3'$ @ Studies/Results: No results found. Medications:  sodium chloride     albumin human 25 g (08/08/22 0918)    ALPRAZolam  0.25 mg Oral Daily   vitamin C  1,000 mg Oral Daily   aspirin EC  81 mg Oral Daily   Chlorhexidine Gluconate  Cloth  6 each Topical Q0600   Chlorhexidine Gluconate Cloth  6 each Topical Q0600   Chlorhexidine Gluconate Cloth  6 each Topical Q0600   Chlorhexidine Gluconate Cloth  6 each Topical Q0600   clopidogrel  75 mg Oral Daily   docusate sodium  100 mg Oral Daily   gabapentin  100 mg Oral TID   heparin  5,000 Units Subcutaneous Q12H   insulin aspart  0-9 Units Subcutaneous TID WC   linaclotide  72 mcg Oral q AM   linagliptin  5 mg Oral Daily   nutrition supplement (JUVEN)  1 packet Oral BID BM   pantoprazole  40 mg Oral Daily   sodium bicarbonate  650 mg Oral BID   sodium chloride flush  3 mL Intravenous Q12H   sucroferric oxyhydroxide  1,000 mg Oral BID WC   zinc sulfate  220 mg Oral Daily     Dialysis Orders: Center: [facility]  on TTS. 180Nre 4 hours 30 min BFR 425 DFR 700 EDW 89kg 2K 2Ca TDC Heparin 8000 unit IV bolus and 2000 units IV mid HD Mircera 131mg IV q 2 weeks- last dose 1078m on 12/16 Venofer '50mg'$  IV  weekly Hectorol 1068mIV TIW On korsuva for itching  Velphoro 3 tabs with meals- last phos 12.5.     Assessment/Plan:  Bradycardia: Resolved Hyperkalemia:Resolved.  L foot wound: S/P L AKA per Dr. DudSharol Given/20/2024  ESRD:  On TTS schedule Next HD 08/10/2022  Hypertension/volume: BP soft on arrival, now improved. Euvolemic on exam and close to his EDW. Will need lower EDW on discharge post AKA. HD 12/21-ran even. UF as tolerated. No antihypertensive meds.   Anemia: Hgb 8.9, stable so far. ESA due 08/17/2022.   Metabolic bone disease: Calcium elevated, will hold hectorol for now. Very noncomplaint with binders/diet, last phos was 12.5 which is probably why he has had pruritus. Resume binders here. PO4 added to today's labs.  Nutrition:  Renal diet. Very low albumin. Add protein supps.  Abnormal LFT's - RUQ US [place] cholelithiasis and gallbladder wall thickening.  Negative US [place] sign, acute hepatitis panel negative.  Per primary.   Cesia Orf H. Paulette Lynch NP-C 08/09/2022, 9:45 AM  [phone]

## 2022-08-10 DIAGNOSIS — I959 Hypotension, unspecified: Secondary | ICD-10-CM | POA: Diagnosis not present

## 2022-08-10 DIAGNOSIS — I739 Peripheral vascular disease, unspecified: Secondary | ICD-10-CM | POA: Diagnosis not present

## 2022-08-10 DIAGNOSIS — N186 End stage renal disease: Secondary | ICD-10-CM | POA: Diagnosis not present

## 2022-08-10 LAB — CBC WITH DIFFERENTIAL/PLATELET
Abs Immature Granulocytes: 0.09 10*3/uL — ABNORMAL HIGH (ref 0.00–0.07)
Basophils Absolute: 0.1 10*3/uL (ref 0.0–0.1)
Basophils Relative: 1 %
Eosinophils Absolute: 0.2 10*3/uL (ref 0.0–0.5)
Eosinophils Relative: 2 %
HCT: 29.9 % — ABNORMAL LOW (ref 39.0–52.0)
Hemoglobin: 9.6 g/dL — ABNORMAL LOW (ref 13.0–17.0)
Immature Granulocytes: 1 %
Lymphocytes Relative: 10 %
Lymphs Abs: 1 10*3/uL (ref 0.7–4.0)
MCH: 30.1 pg (ref 26.0–34.0)
MCHC: 32.1 g/dL (ref 30.0–36.0)
MCV: 93.7 fL (ref 80.0–100.0)
Monocytes Absolute: 1.1 10*3/uL — ABNORMAL HIGH (ref 0.1–1.0)
Monocytes Relative: 11 %
Neutro Abs: 7.4 10*3/uL (ref 1.7–7.7)
Neutrophils Relative %: 75 %
Platelets: 169 10*3/uL (ref 150–400)
RBC: 3.19 MIL/uL — ABNORMAL LOW (ref 4.22–5.81)
RDW: 17.7 % — ABNORMAL HIGH (ref 11.5–15.5)
WBC: 9.7 10*3/uL (ref 4.0–10.5)
nRBC: 0.8 % — ABNORMAL HIGH (ref 0.0–0.2)

## 2022-08-10 LAB — CULTURE, BLOOD (ROUTINE X 2)
Culture: NO GROWTH
Culture: NO GROWTH
Special Requests: ADEQUATE
Special Requests: ADEQUATE

## 2022-08-10 LAB — COMPREHENSIVE METABOLIC PANEL
ALT: 28 U/L (ref 0–44)
AST: 30 U/L (ref 15–41)
Albumin: 2.6 g/dL — ABNORMAL LOW (ref 3.5–5.0)
Alkaline Phosphatase: 111 U/L (ref 38–126)
Anion gap: 19 — ABNORMAL HIGH (ref 5–15)
BUN: 62 mg/dL — ABNORMAL HIGH (ref 6–20)
CO2: 21 mmol/L — ABNORMAL LOW (ref 22–32)
Calcium: 8.7 mg/dL — ABNORMAL LOW (ref 8.9–10.3)
Chloride: 95 mmol/L — ABNORMAL LOW (ref 98–111)
Creatinine, Ser: 6.29 mg/dL — ABNORMAL HIGH (ref 0.61–1.24)
GFR, Estimated: 10 mL/min — ABNORMAL LOW (ref >60)
Glucose, Bld: 191 mg/dL — ABNORMAL HIGH (ref 70–99)
Potassium: 4.5 mmol/L (ref 3.5–5.1)
Sodium: 135 mmol/L (ref 135–145)
Total Bilirubin: 1.5 mg/dL — ABNORMAL HIGH (ref 0.3–1.2)
Total Protein: 6.8 g/dL (ref 6.5–8.1)

## 2022-08-10 LAB — CBC
HCT: 27.4 % — ABNORMAL LOW (ref 39.0–52.0)
Hemoglobin: 8.7 g/dL — ABNORMAL LOW (ref 13.0–17.0)
MCH: 29.7 pg (ref 26.0–34.0)
MCHC: 31.8 g/dL (ref 30.0–36.0)
MCV: 93.5 fL (ref 80.0–100.0)
Platelets: 184 10*3/uL (ref 150–400)
RBC: 2.93 MIL/uL — ABNORMAL LOW (ref 4.22–5.81)
RDW: 17.9 % — ABNORMAL HIGH (ref 11.5–15.5)
WBC: 8.1 10*3/uL (ref 4.0–10.5)
nRBC: 1.1 % — ABNORMAL HIGH (ref 0.0–0.2)

## 2022-08-10 LAB — GLUCOSE, CAPILLARY: Glucose-Capillary: 147 mg/dL — ABNORMAL HIGH (ref 70–99)

## 2022-08-10 MED ORDER — HEPARIN SODIUM (PORCINE) 1000 UNIT/ML DIALYSIS: 1000.0000 [IU] | INTRAMUSCULAR | Status: DC | PRN

## 2022-08-10 MED ORDER — LIDOCAINE-PRILOCAINE 2.5-2.5 % EX CREA: 1.0000 | TOPICAL_CREAM | CUTANEOUS | Status: DC | PRN

## 2022-08-10 MED ORDER — ANTICOAGULANT SODIUM CITRATE 4% (200MG/5ML) IV SOLN: 5.0000 mL | Status: DC | PRN

## 2022-08-10 MED ORDER — LIDOCAINE HCL (PF) 1 % IJ SOLN: 5.0000 mL | INTRAMUSCULAR | Status: DC | PRN

## 2022-08-10 MED ORDER — HEPARIN SODIUM (PORCINE) 1000 UNIT/ML IJ SOLN
INTRAMUSCULAR | Status: AC
  Administered 2022-08-10: 3800 [IU]
  Filled 2022-08-10: qty 4

## 2022-08-10 MED ORDER — ALTEPLASE 2 MG IJ SOLR: 2.0000 mg | Freq: Once | INTRAMUSCULAR | Status: DC | PRN

## 2022-08-10 MED ORDER — OXYCODONE HCL 5 MG PO TABS
5.0000 mg | ORAL_TABLET | ORAL | Status: DC | PRN
  Administered 2022-08-10 – 2022-08-13 (×6): 5 mg via ORAL
  Filled 2022-08-10 (×6): qty 1

## 2022-08-10 MED ORDER — PENTAFLUOROPROP-TETRAFLUOROETH EX AERO: 1.0000 | INHALATION_SPRAY | CUTANEOUS | Status: DC | PRN

## 2022-08-10 NOTE — Progress Notes (Signed)
Received patient in bed,awake alert and oriented x 4.Denies pain,Vitals stable though blood pressure is on the soft side.  Access used. Right HD catheter that works well during treatment.Dressing -WDL, on date.  Durations of treatment : 4 hours  Medicine given : 25 g Albumin at the start of the treatment.                            Tylenol 650 mg.  Fluid removed: None   Fluid from HD machine 600 cc.  Hemodialysis tx issue: Started his HD treatment with SBP of mid 90"s.Albumin 25 gram given at the start of the treatment ,Patient blood pressure SBP was at best 99 at the end of the treatment.Marland Kitchen

## 2022-08-10 NOTE — Procedures (Signed)
I was present at this dialysis session. I have reviewed the session itself and made appropriate changes.   Vital signs in last 24 hours:  Temp:  [97.5 F (36.4 C)-98.6 F (37 C)] 97.5 F (36.4 C) (12/23 0756) Pulse Rate:  [89-98] 91 (12/23 0756) Resp:  [16] 16 (12/23 0756) BP: (83-97)/(59-74) 93/72 (12/23 0756) SpO2:  [93 %-99 %] 94 % (12/23 0344) Weight change:  Filed Weights   08/05/22 1947 08/08/22 0854 08/08/22 1330  Weight: 89.4 kg 77.1 kg 77.1 kg    Recent Labs  Lab 08/09/22 1030 08/10/22 0240  NA  --  135  K  --  4.5  CL  --  95*  CO2  --  21*  GLUCOSE  --  191*  BUN  --  62*  CREATININE  --  6.29*  CALCIUM  --  8.7*  PHOS 4.9*  --     Recent Labs  Lab 08/08/22 0435 08/09/22 0315 08/10/22 0240  WBC 9.5 11.6* 9.7  NEUTROABS 7.9* 9.2* 7.4  HGB 8.9* 8.9* 9.6*  HCT 28.8* 28.1* 29.9*  MCV 95.0 94.9 93.7  PLT 176 154 169    Scheduled Meds:  (feeding supplement) PROSource Plus  30 mL Oral BID BM   ALPRAZolam  0.25 mg Oral Daily   vitamin C  1,000 mg Oral Daily   aspirin EC  81 mg Oral Daily   Chlorhexidine Gluconate Cloth  6 each Topical Q0600   Chlorhexidine Gluconate Cloth  6 each Topical Q0600   Chlorhexidine Gluconate Cloth  6 each Topical Q0600   Chlorhexidine Gluconate Cloth  6 each Topical Q0600   clopidogrel  75 mg Oral Daily   docusate sodium  100 mg Oral Daily   feeding supplement (NEPRO CARB STEADY)  237 mL Oral BID BM   gabapentin  100 mg Oral TID   heparin  5,000 Units Subcutaneous Q12H   insulin aspart  0-9 Units Subcutaneous TID WC   linaclotide  72 mcg Oral q AM   linagliptin  5 mg Oral Daily   nutrition supplement (JUVEN)  1 packet Oral BID BM   pantoprazole  40 mg Oral Daily   sodium bicarbonate  650 mg Oral BID   sodium chloride flush  3 mL Intravenous Q12H   sucroferric oxyhydroxide  1,000 mg Oral BID WC   zinc sulfate  220 mg Oral Daily   Continuous Infusions:  sodium chloride     albumin human 999 mL/hr at 08/10/22 0331    anticoagulant sodium citrate     PRN Meds:.sodium chloride, acetaminophen, albumin human, alteplase, anticoagulant sodium citrate, bisacodyl, bismuth subsalicylate, camphor-menthol, guaiFENesin-dextromethorphan, heparin, lidocaine (PF), lidocaine-prilocaine, morphine injection, ondansetron, mouth rinse, oxyCODONE, oxyCODONE, pentafluoroprop-tetrafluoroeth, phenol, polyethylene glycol, sodium chloride flush   Edward Potts,  MD 08/10/2022, 8:37 AM

## 2022-08-10 NOTE — Progress Notes (Signed)
PROGRESS NOTE    CONSTANTINE RUDDICK  WJX:[id number] DOB: 04/03/1971 DOA: 08/05/2022 PCP: [name], MD   Brief Narrative:  51 y.o. male with medical history significant of ESRD on HD, PVD status post multiple amputations including right AKA, and multiple fingers amputation, and left transmetatarsal amputation, with chronic left leg and foot and left shin wound, IDDM, combined chronic combined CHF with LVEF 45%, chronic orthostatic hypotension, recurrent hyperkalemia, presented with worsening nausea, vomiting and dizziness.  On presentation, he was bradycardic and hypotensive: Blood pressure responded to a small IV bolus of 100 ml normal saline.  Potassium of 7, creatinine of 8.8.  EKG showed sinus bradycardia with T wave.  He was given Lokelma, D50 and insulin and underwent emergent hemodialysis.  Vascular surgery and orthopedic surgery were consulted.  He underwent left AKA on 08/07/2022 by orthopedics.  Assessment & Plan:   End-stage renal disease on hemodialysis/Hyperkalemia/Acute metabolic acidosis -Presented with potassium of 7.  Required oral Lokelma, D50 and insulin and underwent emergent hemodialysis.   Nephrology continues to follow.   Dialysis as per nephrology schedule.    Left lower extremity dry gangrene now status post left AKA/sepsis present on admission/lactic acidosis Seen by vascular surgery who did not feel that patient had any revascularization options.  AKA was recommended.  Was seen by Dr. Sharol Given and underwent AKA on 12/20.  Wound VAC in place.  Management per orthopedics. Antibiotics have been discontinued. Pain control. Physical therapy is following.  Hypotension Borderline low blood pressures have been noted.  Thought to be due to narcotics.  Not on any blood pressure lowering agents at this time.  Continue to monitor.  Anemia of chronic disease Hemoglobin is stable.  Hyponatremia Resolved  Acute transaminitis/Cholelithiasis LFTs have improved. Right upper  quadrant ultrasound showed cholelithiasis with gallbladder wall thickening without sonographic Murphy.  CBD not dilated.   Hepatitis panel negative.  CAD/Chronic combined heart failure -No chest pain.  Volume managed by dialysis. Last echocardiogram in our system was from 2022 and his EF is noted to be 50 to 55%.  Grade 2 diastolic dysfunction was noted.  Diabetes mellitus type 2 with hyperglycemia -Monitor blood sugars.  Continue linagliptin.  CBGs with SSI.  HbA1c 6.3 in September.  Goals of care -Palliative care consultation appreciated: Currently remains full code  DVT prophylaxis: Heparin subcutaneous Code Status: Full Family Communication: None at bedside Disposition Plan: Inpatient rehab   Status is: Inpatient Remains inpatient appropriate because: Left AMA.  Needs inpatient rehab  Consultants: Nephrology/vascular surgery/orthopedics.  Palliative care  Procedures: Left AKA on 08/07/2022  Antimicrobials: Cefepime, vancomycin and Flagyl from 08/06/2022-08/07/2022  Subjective: Patient somnolent but easily arousable.  Pain is reasonably well-controlled.  Denies any chest pain or shortness of breath.  No nausea or vomiting.  Objective: Vitals:   08/10/22 0846 08/10/22 0903 08/10/22 0933 08/10/22 0935  BP: 93/66 95/71 100/73 94/64  Pulse: 65 88 84 81  Resp: '14 16 17 16  '$ Temp:      TempSrc:      SpO2: 100% 96% 98% 91%  Weight:      Height:        Intake/Output Summary (Last 24 hours) at 08/10/2022 0936 Last data filed at 08/10/2022 0600 Gross per 24 hour  Intake 100 ml  Output 0 ml  Net 100 ml    Filed Weights   08/05/22 1947 08/08/22 0854 08/08/22 1330  Weight: 89.4 kg 77.1 kg 77.1 kg    Examination:  General appearance: Awake alert.  In  no distress Resp: Clear to auscultation bilaterally.  Normal effort Cardio: S1-S2 is normal regular.  No S3-S4.  No rubs murmurs or bruit GI: Abdomen is soft.  Nontender nondistended.  Bowel sounds are present normal.   No masses organomegaly Extremities: Bilateral AKA.  Wound VAC in place on the left. Neurologic: Alert and oriented x3.  No focal neurological deficits.       Data Reviewed: I have personally reviewed following labs and imaging studies  CBC: Recent Labs  Lab 08/05/22 1240 08/05/22 1248 08/07/22 0321 08/08/22 0435 08/09/22 0315 08/10/22 0240 08/10/22 0858  WBC 12.0*   < > 9.3 9.5 11.6* 9.7 8.1  NEUTROABS 9.7*  --  7.8* 7.9* 9.2* 7.4  --   HGB 11.1*   < > 9.8* 8.9* 8.9* 9.6* 8.7*  HCT 36.3*   < > 31.6* 28.8* 28.1* 29.9* 27.4*  MCV 98.1   < > 94.6 95.0 94.9 93.7 93.5  PLT 270   < > 213 176 154 169 184   < > = values in this interval not displayed.    Basic Metabolic Panel: Recent Labs  Lab 08/05/22 1240 08/05/22 1248 08/06/22 0344 08/07/22 0321 08/08/22 0435 08/09/22 0315 08/09/22 1030 08/10/22 0240  NA 139   < > 133* 136 135 137  --  135  K 7.0*   < > 3.7 3.8 4.0 4.5  --  4.5  CL 92*   < > 94* 96* 95* 97*  --  95*  CO2 12*  --  '22 22 24 22  '$ --  21*  GLUCOSE 105*   < > 148* 157* 195* 156*  --  191*  BUN 71*   < > 36* 30* 48* 30*  --  62*  CREATININE 8.84*   < > 4.83* 4.37* 6.44* 4.72*  --  6.29*  CALCIUM 10.2  --  8.6* 8.6* 8.4* 8.7*  --  8.7*  MG 2.8*  --   --   --   --   --   --   --   PHOS  --   --   --   --   --   --  4.9*  --    < > = values in this interval not displayed.    GFR: Estimated Creatinine Clearance: 15 mL/min (A) (by C-G formula based on SCr of 6.29 mg/dL (H)). Liver Function Tests: Recent Labs  Lab 08/06/22 0344 08/07/22 0321 08/08/22 1401 08/09/22 0315 08/10/22 0240  AST 333* 451* 113* 48* 30  ALT 155* 273* 138* 59* 28  ALKPHOS 141* 132* 114 102 111  BILITOT 1.1 1.2 0.9 1.1 1.5*  PROT 6.8 6.5 6.4* 6.4* 6.8  ALBUMIN 2.7* 2.4* 2.3* 2.5* 2.6*    Recent Labs  Lab 08/05/22 1240  LIPASE 24    Cardiac Enzymes: Recent Labs  Lab 08/05/22 1318  CKTOTAL 221    CBG: Recent Labs  Lab 08/08/22 2144 08/09/22 0824 08/09/22 1158  08/09/22 1730 08/09/22 2137  GLUCAP 160* 158* 184* 185* 174*     Sepsis Labs: Recent Labs  Lab 08/05/22 2105 08/06/22 0344 08/07/22 0321  PROCALCITON 11.58 13.54 10.91  LATICACIDVEN 7.1* 2.8*  --      Recent Results (from the past 240 hour(s))  Resp panel by RT-PCR (RSV, Flu A&B, Covid) Anterior Nasal Swab     Status: None   Collection Time: 08/05/22 12:39 PM   Specimen: Anterior Nasal Swab  Result Value Ref Range Status   SARS Coronavirus 2 by  RT PCR NEGATIVE NEGATIVE Final    Comment: (NOTE) SARS-CoV-2 target nucleic acids are NOT DETECTED.  The SARS-CoV-2 RNA is generally detectable in upper respiratory specimens during the acute phase of infection. The lowest concentration of SARS-CoV-2 viral copies this assay can detect is 138 copies/mL. A negative result does not preclude SARS-Cov-2 infection and should not be used as the sole basis for treatment or other patient management decisions. A negative result may occur with  improper specimen collection/handling, submission of specimen other than nasopharyngeal swab, presence of viral mutation(s) within the areas targeted by this assay, and inadequate number of viral copies(<138 copies/mL). A negative result must be combined with clinical observations, patient history, and epidemiological information. The expected result is Negative.  Fact Sheet for Patients:  EntrepreneurPulse.com.au  Fact Sheet for Healthcare Providers:  IncredibleEmployment.be  This test is no t yet approved or cleared by the [place] FDA and  has been authorized for detection and/or diagnosis of SARS-CoV-2 by FDA under an Emergency Use Authorization (EUA). This EUA will remain  in effect (meaning this test can be used) for the duration of the COVID-19 declaration under Section 564(b)(1) of the Act, 21 U.S.C.section 360bbb-3(b)(1), unless the authorization is terminated  or revoked sooner.        Influenza A by PCR NEGATIVE NEGATIVE Final   Influenza B by PCR NEGATIVE NEGATIVE Final    Comment: (NOTE) The Xpert Xpress SARS-CoV-2/FLU/RSV plus assay is intended as an aid in the diagnosis of influenza from Nasopharyngeal swab specimens and should not be used as a sole basis for treatment. Nasal washings and aspirates are unacceptable for Xpert Xpress SARS-CoV-2/FLU/RSV testing.  Fact Sheet for Patients: EntrepreneurPulse.com.au  Fact Sheet for Healthcare Providers: IncredibleEmployment.be  This test is not yet approved or cleared by the [place] FDA and has been authorized for detection and/or diagnosis of SARS-CoV-2 by FDA under an Emergency Use Authorization (EUA). This EUA will remain in effect (meaning this test can be used) for the duration of the COVID-19 declaration under Section 564(b)(1) of the Act, 21 U.S.C. section 360bbb-3(b)(1), unless the authorization is terminated or revoked.     Resp Syncytial Virus by PCR NEGATIVE NEGATIVE Final    Comment: (NOTE) Fact Sheet for Patients: EntrepreneurPulse.com.au  Fact Sheet for Healthcare Providers: IncredibleEmployment.be  This test is not yet approved or cleared by the [place] FDA and has been authorized for detection and/or diagnosis of SARS-CoV-2 by FDA under an Emergency Use Authorization (EUA). This EUA will remain in effect (meaning this test can be used) for the duration of the COVID-19 declaration under Section 564(b)(1) of the Act, 21 U.S.C. section 360bbb-3(b)(1), unless the authorization is terminated or revoked.  Performed at [facility], [place] [address], [place], [place] [address]   Culture, blood (Routine X 2) w Reflex to ID Panel     Status: None (Preliminary result)   Collection Time: 08/05/22  9:05 PM   Specimen: BLOOD RIGHT ARM  Result Value Ref Range Status   Specimen Description BLOOD RIGHT ARM  Final    Special Requests   Final    BOTTLES DRAWN AEROBIC AND ANAEROBIC Blood Culture adequate volume   Culture   Final    NO GROWTH 4 DAYS Performed at [facility], [place] [address], [place],  [address]    Report Status PENDING  Incomplete  Culture, blood (Routine X 2) w Reflex to ID Panel     Status: None (Preliminary result)   Collection  Time: 08/05/22  9:05 PM   Specimen: BLOOD RIGHT ARM  Result Value Ref Range Status   Specimen Description BLOOD RIGHT ARM  Final   Special Requests   Final    BOTTLES DRAWN AEROBIC AND ANAEROBIC Blood Culture adequate volume   Culture   Final    NO GROWTH 4 DAYS Performed at [facility], [address], [place], [place] [address]    Report Status PENDING  Incomplete  MRSA Next Gen by PCR, Nasal     Status: None   Collection Time: 08/06/22 12:50 AM   Specimen: Nasal Mucosa; Nasal Swab  Result Value Ref Range Status   MRSA by PCR Next Gen NOT DETECTED NOT DETECTED Final    Comment: (NOTE) The GeneXpert MRSA Assay (FDA approved for NASAL specimens only), is one component of a comprehensive MRSA colonization surveillance program. It is not intended to diagnose MRSA infection nor to guide or monitor treatment for MRSA infections. Test performance is not FDA approved in patients less than 60 years old. Performed at [facility], [place] [address] [place] [address], [place], [place] [address]       Radiology Studies: No results found.   Scheduled Meds:  (feeding supplement) PROSource Plus  30 mL Oral BID BM   ALPRAZolam  0.25 mg Oral Daily   vitamin C  1,000 mg Oral Daily   aspirin EC  81 mg Oral Daily   Chlorhexidine Gluconate Cloth  6 each Topical Q0600   Chlorhexidine Gluconate Cloth  6 each Topical Q0600   Chlorhexidine Gluconate Cloth  6 each Topical Q0600   Chlorhexidine Gluconate Cloth  6 each Topical Q0600   clopidogrel  75 mg Oral Daily   docusate sodium  100 mg Oral Daily   feeding supplement (NEPRO CARB STEADY)  237 mL Oral BID  BM   gabapentin  100 mg Oral TID   heparin  5,000 Units Subcutaneous Q12H   insulin aspart  0-9 Units Subcutaneous TID WC   linaclotide  72 mcg Oral q AM   linagliptin  5 mg Oral Daily   nutrition supplement (JUVEN)  1 packet Oral BID BM   pantoprazole  40 mg Oral Daily   sodium bicarbonate  650 mg Oral BID   sodium chloride flush  3 mL Intravenous Q12H   sucroferric oxyhydroxide  1,000 mg Oral BID WC   zinc sulfate  220 mg Oral Daily   Continuous Infusions:  sodium chloride     anticoagulant sodium citrate         Bonnielee Haff, MD [facility] 08/10/2022, 9:36 AM

## 2022-08-11 DIAGNOSIS — I739 Peripheral vascular disease, unspecified: Secondary | ICD-10-CM | POA: Diagnosis not present

## 2022-08-11 DIAGNOSIS — I959 Hypotension, unspecified: Secondary | ICD-10-CM | POA: Diagnosis not present

## 2022-08-11 DIAGNOSIS — N186 End stage renal disease: Secondary | ICD-10-CM | POA: Diagnosis not present

## 2022-08-11 LAB — GLUCOSE, CAPILLARY
Glucose-Capillary: 127 mg/dL — ABNORMAL HIGH (ref 70–99)
Glucose-Capillary: 120 mg/dL — ABNORMAL HIGH (ref 70–99)
Glucose-Capillary: 169 mg/dL — ABNORMAL HIGH (ref 70–99)

## 2022-08-11 MED ORDER — MIDODRINE HCL 5 MG PO TABS
10.0000 mg | ORAL_TABLET | Freq: Three times a day (TID) | ORAL | Status: DC
  Administered 2022-08-11 – 2022-08-13 (×6): 10 mg via ORAL
  Filled 2022-08-11 (×8): qty 2

## 2022-08-11 NOTE — Progress Notes (Addendum)
[facility] Progress Note   Subjective: Seen in room. No C/Os. BP soft, prohibiting UF. Ran even again in HD 08/10/2022.    Objective Vitals:   08/10/22 1323 08/10/22 1344 08/10/22 2130 08/11/22 0611  BP:  100/74 (!) 89/68 (!) 84/66  Pulse:   88 83  Resp:  16 14   Temp:   98 F (36.7 C) 98.6 F (37 C)  TempSrc:   Oral Oral  SpO2:   96%   Weight: 76.7 kg     Height:       Physical Exam General: Chronically ill appearing male in  NAD  Heart: SR 1st degree AVB on monitor.Rate 90s. S1,S2 no M/R/G Lungs: CTAB A/P Abdomen: soft, NABS Extremities: wound vac L AKA, no stump edema R AKA Amputated fingers both hands.  Dialysis Access: RIJ TDC drsg intact.   Additional Objective Labs: Basic Metabolic Panel: Recent Labs  Lab 08/08/22 0435 08/09/22 0315 08/09/22 1030 08/10/22 0240  NA 135 137  --  135  K 4.0 4.5  --  4.5  CL 95* 97*  --  95*  CO2 24 22  --  21*  GLUCOSE 195* 156*  --  191*  BUN 48* 30*  --  62*  CREATININE 6.44* 4.72*  --  6.29*  CALCIUM 8.4* 8.7*  --  8.7*  PHOS  --   --  4.9*  --    Liver Function Tests: Recent Labs  Lab 08/08/22 1401 08/09/22 0315 08/10/22 0240  AST 113* 48* 30  ALT 138* 59* 28  ALKPHOS 114 102 111  BILITOT 0.9 1.1 1.5*  PROT 6.4* 6.4* 6.8  ALBUMIN 2.3* 2.5* 2.6*   Recent Labs  Lab 08/05/22 1240  LIPASE 24   CBC: Recent Labs  Lab 08/07/22 0321 08/08/22 0435 08/09/22 0315 08/10/22 0240 08/10/22 0858  WBC 9.3 9.5 11.6* 9.7 8.1  NEUTROABS 7.8* 7.9* 9.2* 7.4  --   HGB 9.8* 8.9* 8.9* 9.6* 8.7*  HCT 31.6* 28.8* 28.1* 29.9* 27.4*  MCV 94.6 95.0 94.9 93.7 93.5  PLT 213 176 154 169 184   Blood Culture    Component Value Date/Time   SDES BLOOD RIGHT ARM 08/05/2022 2105   SDES BLOOD RIGHT ARM 08/05/2022 2105   SPECREQUEST  08/05/2022 2105    BOTTLES DRAWN AEROBIC AND ANAEROBIC Blood Culture adequate volume   SPECREQUEST  08/05/2022 2105    BOTTLES DRAWN AEROBIC AND ANAEROBIC Blood Culture adequate  volume   CULT  08/05/2022 2105    NO GROWTH 5 DAYS Performed at [facility], [place] [address], [place],  [place] [address]    CULT  08/05/2022 2105    NO GROWTH 5 DAYS Performed at [place] [address] [place], [place] [address]    REPTSTATUS 08/10/2022 FINAL 08/05/2022 2105   REPTSTATUS 08/10/2022 FINAL 08/05/2022 2105    Cardiac Enzymes: Recent Labs  Lab 08/05/22 1318  CKTOTAL 221   CBG: Recent Labs  Lab 08/09/22 1730 08/09/22 2137 08/10/22 1736 08/11/22 0753 08/11/22 1122  GLUCAP 185* 174* 147* 127* 120*   Iron Studies: No results for input(s): "IRON", "TIBC", "TRANSFERRIN", "FERRITIN" in the last 72 hours. '@lablastinr3'$ @ Studies/Results: No results found. Medications:  sodium chloride      (feeding supplement) PROSource Plus  30 mL Oral BID BM   ALPRAZolam  0.25 mg Oral Daily   vitamin C  1,000 mg Oral Daily   aspirin EC  81 mg Oral Daily   Chlorhexidine Gluconate Cloth  6 each Topical Q0600   Chlorhexidine Gluconate Cloth  6 each Topical Q0600   Chlorhexidine Gluconate Cloth  6 each Topical Q0600   Chlorhexidine Gluconate Cloth  6 each Topical Q0600   clopidogrel  75 mg Oral Daily   docusate sodium  100 mg Oral Daily   feeding supplement (NEPRO CARB STEADY)  237 mL Oral BID BM   gabapentin  100 mg Oral TID   heparin  5,000 Units Subcutaneous Q12H   insulin aspart  0-9 Units Subcutaneous TID WC   linaclotide  72 mcg Oral q AM   linagliptin  5 mg Oral Daily   nutrition supplement (JUVEN)  1 packet Oral BID BM   pantoprazole  40 mg Oral Daily   sodium bicarbonate  650 mg Oral BID   sodium chloride flush  3 mL Intravenous Q12H   sucroferric oxyhydroxide  1,000 mg Oral BID WC   zinc sulfate  220 mg Oral Daily     Dialysis Orders: Center: [facility]  on TTS. 180Nre 4 hours 30 min BFR 425 DFR 700 EDW 89kg 2K 2Ca TDC Heparin 8000 unit IV bolus and 2000 units IV mid HD Mircera 160mg IV q 2 weeks- last dose 1047m on 12/16 Venofer '50mg'$  IV  weekly Hectorol 1074mIV TIW On korsuva for itching  Velphoro 3 tabs with meals- last phos 12.5.     Assessment/Plan:  Bradycardia: Resolved Hyperkalemia:Resolved.  L foot wound: S/P L AKA per Dr. DudSharol Given/20/2024  ESRD:  On TTS schedule Next HD 08/13/2022. OP Heparin dose too high for current wt. Lower dose on discharge. Holding post op for now.   Hypotension/volume: BP remains soft. Pain medication may be playing a role but noted to have hypotension at OP [facility] well. Start midodrine 10 mg PO TID. Euvolemic on exam and under EDW. Will need lower EDW on discharge post AKA. HD 12/23-Net UF 0. UF as tolerated. No antihypertensive meds.   Anemia: Hgb 8.7. ESA due 08/17/2022.   Metabolic bone disease: Calcium elevated, will hold hectorol for now. Very noncomplaint with binders/diet, last phos was 12.5 which is probably why he has had pruritus. Resume binders here. PO4 4.9 08/09/2022  Nutrition:  Renal diet. Very low albumin. Add protein supps.  Abnormal LFT's - RUQ US [place] cholelithiasis and gallbladder wall thickening.  Negative US [place] sign, acute hepatitis panel negative.  Per primary.   Disposition: Plans for inpatient rehab  [place] Terryn Redner NP-C 08/11/2022, 11:27 AM  [phone]

## 2022-08-11 NOTE — Plan of Care (Signed)
  Problem: Education: Goal: Ability to describe self-care measures that may prevent or decrease complications (Diabetes Survival Skills Education) will improve Outcome: Progressing   Problem: Coping: Goal: Ability to adjust to condition or change in health will improve Outcome: Progressing   Problem: Fluid Volume: Goal: Ability to maintain a balanced intake and output will improve Outcome: Progressing   Problem: Health Behavior/Discharge Planning: Goal: Ability to identify and utilize available resources and services will improve Outcome: Progressing Goal: Ability to manage health-related needs will improve Outcome: Progressing   Problem: Skin Integrity: Goal: Risk for impaired skin integrity will decrease Outcome: Progressing   Problem: Tissue Perfusion: Goal: Adequacy of tissue perfusion will improve Outcome: Progressing   Problem: Education: Goal: Knowledge of General Education information will improve Description: Including pain rating scale, medication(s)/side effects and non-pharmacologic comfort measures Outcome: Progressing   Problem: Health Behavior/Discharge Planning: Goal: Ability to manage health-related needs will improve Outcome: Progressing   Problem: Clinical Measurements: Goal: Ability to maintain clinical measurements within normal limits will improve Outcome: Progressing Goal: Respiratory complications will improve Outcome: Progressing   Problem: Activity: Goal: Risk for activity intolerance will decrease Outcome: Progressing   Problem: Coping: Goal: Level of anxiety will decrease Outcome: Progressing

## 2022-08-11 NOTE — Progress Notes (Signed)
PROGRESS NOTE    Edward [name]  XKG:[id number] DOB: 1971/06/08 DOA: 08/05/2022 PCP: [name], MD   Brief Narrative:  51 y.o. male with medical history significant of ESRD on HD, PVD status post multiple amputations including right AKA, and multiple fingers amputation, and left transmetatarsal amputation, with chronic left leg and foot and left shin wound, IDDM, combined chronic combined CHF with LVEF 45%, chronic orthostatic hypotension, recurrent hyperkalemia, presented with worsening nausea, vomiting and dizziness.  On presentation, he was bradycardic and hypotensive: Blood pressure responded to a small IV bolus of 100 ml normal saline.  Potassium of 7, creatinine of 8.8.  EKG showed sinus bradycardia with T wave.  He was given Lokelma, D50 and insulin and underwent emergent hemodialysis.  Vascular surgery and orthopedic surgery were consulted.  He underwent left AKA on 08/07/2022 by orthopedics.  Assessment & Plan:   Left lower extremity dry gangrene now status post left AKA/sepsis present on admission/lactic acidosis Seen by vascular surgery who did not feel that patient had any revascularization options.  AKA was recommended.   Was seen by Dr. Sharol Given and underwent AKA on 12/20.  Wound VAC in place.  Management per orthopedics. Antibiotics have been discontinued. Pain control. Physical therapy is following.  End-stage renal disease on hemodialysis/Hyperkalemia/Acute metabolic acidosis -Presented with potassium of 7.  Required oral Lokelma, D50 and insulin and underwent emergent hemodialysis.   Nephrology continues to follow.   Dialysis as per nephrology.    Hypotension Borderline low blood pressures have been noted.  Thought to be due to narcotics.  Not on any blood pressure lowering agents at this time.   Has not received any intravenous narcotics in the last 48 hours.  Blood pressure remains borderline low but patient is asymptomatic.  Continue to monitor for now.  May need to use  midodrine.    Anemia of chronic disease Hemoglobin is stable.  Hyponatremia Resolved  Acute transaminitis/Cholelithiasis LFTs have improved. Right upper quadrant ultrasound showed cholelithiasis with gallbladder wall thickening without sonographic Murphy.  CBD not dilated.   Hepatitis panel negative.  CAD/Chronic combined heart failure Volume managed by dialysis. Last echocardiogram in our system was from 2022 and his EF is noted to be 50 to 55%.  Grade 2 diastolic dysfunction was noted.  Diabetes mellitus type 2 with hyperglycemia Monitor blood sugars.  Continue linagliptin.  CBGs with SSI.  HbA1c 6.3 in September.  Goals of care Palliative care consultation appreciated: Currently remains full code  DVT prophylaxis: Heparin subcutaneous Code Status: Full Family Communication: None at bedside Disposition Plan: Inpatient rehab   Status is: Inpatient Remains inpatient appropriate because: Left AKA.  Needs inpatient rehab  Consultants: Nephrology/vascular surgery/orthopedics.  Palliative care  Procedures: Left AKA on 08/07/2022  Antimicrobials: Cefepime, vancomycin and Flagyl from 08/06/2022-08/07/2022  Subjective: Patient denies any dizziness or lightheadedness.  No nausea or vomiting.  Pain in the left lower extremity is reasonably well-controlled.  Objective: Vitals:   08/10/22 1323 08/10/22 1344 08/10/22 2130 08/11/22 0611  BP:  100/74 (!) 89/68 (!) 84/66  Pulse:   88 83  Resp:  16 14   Temp:   98 F (36.7 C) 98.6 F (37 C)  TempSrc:   Oral Oral  SpO2:   96%   Weight: 76.7 kg     Height:        Intake/Output Summary (Last 24 hours) at 08/11/2022 1053 Last data filed at 08/11/2022 7026 Gross per 24 hour  Intake --  Output 0 ml  Net  0 ml    Filed Weights   08/10/22 0830 08/10/22 1255 08/10/22 1323  Weight: 76 kg 76.7 kg 76.7 kg    Examination:  General appearance: Awake alert.  In no distress Resp: Clear to auscultation bilaterally.  Normal  effort Cardio: S1-S2 is normal regular.  No S3-S4.  No rubs murmurs or bruit GI: Abdomen is soft.  Nontender nondistended.  Bowel sounds are present normal.  No masses organomegaly Extremities: Bilateral AKA.  Wound VAC in place in the left lower extremity     Data Reviewed: I have personally reviewed following labs and imaging studies  CBC: Recent Labs  Lab 08/05/22 1240 08/05/22 1248 08/07/22 0321 08/08/22 0435 08/09/22 0315 08/10/22 0240 08/10/22 0858  WBC 12.0*   < > 9.3 9.5 11.6* 9.7 8.1  NEUTROABS 9.7*  --  7.8* 7.9* 9.2* 7.4  --   HGB 11.1*   < > 9.8* 8.9* 8.9* 9.6* 8.7*  HCT 36.3*   < > 31.6* 28.8* 28.1* 29.9* 27.4*  MCV 98.1   < > 94.6 95.0 94.9 93.7 93.5  PLT 270   < > 213 176 154 169 184   < > = values in this interval not displayed.    Basic Metabolic Panel: Recent Labs  Lab 08/05/22 1240 08/05/22 1248 08/06/22 0344 08/07/22 0321 08/08/22 0435 08/09/22 0315 08/09/22 1030 08/10/22 0240  NA 139   < > 133* 136 135 137  --  135  K 7.0*   < > 3.7 3.8 4.0 4.5  --  4.5  CL 92*   < > 94* 96* 95* 97*  --  95*  CO2 12*  --  '22 22 24 22  '$ --  21*  GLUCOSE 105*   < > 148* 157* 195* 156*  --  191*  BUN 71*   < > 36* 30* 48* 30*  --  62*  CREATININE 8.84*   < > 4.83* 4.37* 6.44* 4.72*  --  6.29*  CALCIUM 10.2  --  8.6* 8.6* 8.4* 8.7*  --  8.7*  MG 2.8*  --   --   --   --   --   --   --   PHOS  --   --   --   --   --   --  4.9*  --    < > = values in this interval not displayed.    GFR: Estimated Creatinine Clearance: 14.9 mL/min (A) (by C-G formula based on SCr of 6.29 mg/dL (H)). Liver Function Tests: Recent Labs  Lab 08/06/22 0344 08/07/22 0321 08/08/22 1401 08/09/22 0315 08/10/22 0240  AST 333* 451* 113* 48* 30  ALT 155* 273* 138* 59* 28  ALKPHOS 141* 132* 114 102 111  BILITOT 1.1 1.2 0.9 1.1 1.5*  PROT 6.8 6.5 6.4* 6.4* 6.8  ALBUMIN 2.7* 2.4* 2.3* 2.5* 2.6*    Recent Labs  Lab 08/05/22 1240  LIPASE 24    Cardiac Enzymes: Recent Labs  Lab  08/05/22 1318  CKTOTAL 221    CBG: Recent Labs  Lab 08/09/22 1158 08/09/22 1730 08/09/22 2137 08/10/22 1736 08/11/22 0753  GLUCAP 184* 185* 174* 147* 127*     Sepsis Labs: Recent Labs  Lab 08/05/22 2105 08/06/22 0344 08/07/22 0321  PROCALCITON 11.58 13.54 10.91  LATICACIDVEN 7.1* 2.8*  --      Recent Results (from the past 240 hour(s))  Resp panel by RT-PCR (RSV, Flu A&B, Covid) Anterior Nasal Swab     Status: None  Collection Time: 08/05/22 12:39 PM   Specimen: Anterior Nasal Swab  Result Value Ref Range Status   SARS Coronavirus 2 by RT PCR NEGATIVE NEGATIVE Final    Comment: (NOTE) SARS-CoV-2 target nucleic acids are NOT DETECTED.  The SARS-CoV-2 RNA is generally detectable in upper respiratory specimens during the acute phase of infection. The lowest concentration of SARS-CoV-2 viral copies this assay can detect is 138 copies/mL. A negative result does not preclude SARS-Cov-2 infection and should not be used as the sole basis for treatment or other patient management decisions. A negative result may occur with  improper specimen collection/handling, submission of specimen other than nasopharyngeal swab, presence of viral mutation(s) within the areas targeted by this assay, and inadequate number of viral copies(<138 copies/mL). A negative result must be combined with clinical observations, patient history, and epidemiological information. The expected result is Negative.  Fact Sheet for Patients:  EntrepreneurPulse.com.au  Fact Sheet for Healthcare Providers:  IncredibleEmployment.be  This test is no t yet approved or cleared by the [place] FDA and  has been authorized for detection and/or diagnosis of SARS-CoV-2 by FDA under an Emergency Use Authorization (EUA). This EUA will remain  in effect (meaning this test can be used) for the duration of the COVID-19 declaration under Section 564(b)(1) of the Act,  21 U.S.C.section 360bbb-3(b)(1), unless the authorization is terminated  or revoked sooner.       Influenza A by PCR NEGATIVE NEGATIVE Final   Influenza B by PCR NEGATIVE NEGATIVE Final    Comment: (NOTE) The Xpert Xpress SARS-CoV-2/FLU/RSV plus assay is intended as an aid in the diagnosis of influenza from Nasopharyngeal swab specimens and should not be used as a sole basis for treatment. Nasal washings and aspirates are unacceptable for Xpert Xpress SARS-CoV-2/FLU/RSV testing.  Fact Sheet for Patients: EntrepreneurPulse.com.au  Fact Sheet for Healthcare Providers: IncredibleEmployment.be  This test is not yet approved or cleared by the [place] FDA and has been authorized for detection and/or diagnosis of SARS-CoV-2 by FDA under an Emergency Use Authorization (EUA). This EUA will remain in effect (meaning this test can be used) for the duration of the COVID-19 declaration under Section 564(b)(1) of the Act, 21 U.S.C. section 360bbb-3(b)(1), unless the authorization is terminated or revoked.     Resp Syncytial Virus by PCR NEGATIVE NEGATIVE Final    Comment: (NOTE) Fact Sheet for Patients: EntrepreneurPulse.com.au  Fact Sheet for Healthcare Providers: IncredibleEmployment.be  This test is not yet approved or cleared by the [place] FDA and has been authorized for detection and/or diagnosis of SARS-CoV-2 by FDA under an Emergency Use Authorization (EUA). This EUA will remain in effect (meaning this test can be used) for the duration of the COVID-19 declaration under Section 564(b)(1) of the Act, 21 U.S.C. section 360bbb-3(b)(1), unless the authorization is terminated or revoked.  Performed at [facility], [place] [address] [place], [place] [address]   Culture, blood (Routine X 2) w Reflex to ID Panel     Status: None   Collection Time: 08/05/22  9:05 PM   Specimen: BLOOD RIGHT ARM   Result Value Ref Range Status   Specimen Description BLOOD RIGHT ARM  Final   Special Requests   Final    BOTTLES DRAWN AEROBIC AND ANAEROBIC Blood Culture adequate volume   Culture   Final    NO GROWTH 5 DAYS Performed at [facility], [place] [address], [place], [place] [address]    Report Status 08/10/2022 FINAL  Final  Culture, blood (Routine X 2) w Reflex to ID Panel     Status: None   Collection Time: 08/05/22  9:05 PM   Specimen: BLOOD RIGHT ARM  Result Value Ref Range Status   Specimen Description BLOOD RIGHT ARM  Final   Special Requests   Final    BOTTLES DRAWN AEROBIC AND ANAEROBIC Blood Culture adequate volume   Culture   Final    NO GROWTH 5 DAYS Performed at [facility], [address], [place], [place] [address]    Report Status 08/10/2022 FINAL  Final  MRSA Next Gen by PCR, Nasal     Status: None   Collection Time: 08/06/22 12:50 AM   Specimen: Nasal Mucosa; Nasal Swab  Result Value Ref Range Status   MRSA by PCR Next Gen NOT DETECTED NOT DETECTED Final    Comment: (NOTE) The GeneXpert MRSA Assay (FDA approved for NASAL specimens only), is one component of a comprehensive MRSA colonization surveillance program. It is not intended to diagnose MRSA infection nor to guide or monitor treatment for MRSA infections. Test performance is not FDA approved in patients less than 42 years old. Performed at [facility], [place] [address] [place], [place] [address]       Radiology Studies: No results found.   Scheduled Meds:  (feeding supplement) PROSource Plus  30 mL Oral BID BM   ALPRAZolam  0.25 mg Oral Daily   vitamin C  1,000 mg Oral Daily   aspirin EC  81 mg Oral Daily   Chlorhexidine Gluconate Cloth  6 each Topical Q0600   Chlorhexidine Gluconate Cloth  6 each Topical Q0600   Chlorhexidine Gluconate Cloth  6 each Topical Q0600   Chlorhexidine Gluconate Cloth  6 each Topical Q0600   clopidogrel  75 mg Oral Daily   docusate sodium  100 mg  Oral Daily   feeding supplement (NEPRO CARB STEADY)  237 mL Oral BID BM   gabapentin  100 mg Oral TID   heparin  5,000 Units Subcutaneous Q12H   insulin aspart  0-9 Units Subcutaneous TID WC   linaclotide  72 mcg Oral q AM   linagliptin  5 mg Oral Daily   nutrition supplement (JUVEN)  1 packet Oral BID BM   pantoprazole  40 mg Oral Daily   sodium bicarbonate  650 mg Oral BID   sodium chloride flush  3 mL Intravenous Q12H   sucroferric oxyhydroxide  1,000 mg Oral BID WC   zinc sulfate  220 mg Oral Daily   Continuous Infusions:  sodium chloride         Bonnielee Haff, MD [facility] 08/11/2022, 10:53 AM

## 2022-08-11 NOTE — Plan of Care (Signed)
  Problem: Education: Goal: Ability to describe self-care measures that may prevent or decrease complications (Diabetes Survival Skills Education) will improve 08/11/2022 0431 by Gerline Legacy, LPN Outcome: Progressing 08/11/2022 0407 by Gerline Legacy, LPN Outcome: Progressing   Problem: Coping: Goal: Ability to adjust to condition or change in health will improve 08/11/2022 0431 by Gerline Legacy, LPN Outcome: Progressing 08/11/2022 0407 by Gerline Legacy, LPN Outcome: Progressing   Problem: Fluid Volume: Goal: Ability to maintain a balanced intake and output will improve 08/11/2022 0431 by Gerline Legacy, LPN Outcome: Progressing 08/11/2022 0407 by Gerline Legacy, LPN Outcome: Progressing   Problem: Health Behavior/Discharge Planning: Goal: Ability to identify and utilize available resources and services will improve 08/11/2022 0431 by Gerline Legacy, LPN Outcome: Progressing 08/11/2022 0407 by Gerline Legacy, LPN Outcome: Progressing Goal: Ability to manage health-related needs will improve 08/11/2022 0431 by Gerline Legacy, LPN Outcome: Progressing 08/11/2022 0407 by Gerline Legacy, LPN Outcome: Progressing

## 2022-08-12 DIAGNOSIS — I739 Peripheral vascular disease, unspecified: Secondary | ICD-10-CM | POA: Diagnosis not present

## 2022-08-12 DIAGNOSIS — I959 Hypotension, unspecified: Secondary | ICD-10-CM | POA: Diagnosis not present

## 2022-08-12 DIAGNOSIS — N186 End stage renal disease: Secondary | ICD-10-CM | POA: Diagnosis not present

## 2022-08-12 LAB — GLUCOSE, CAPILLARY
Glucose-Capillary: 160 mg/dL — ABNORMAL HIGH (ref 70–99)
Glucose-Capillary: 140 mg/dL — ABNORMAL HIGH (ref 70–99)
Glucose-Capillary: 150 mg/dL — ABNORMAL HIGH (ref 70–99)
Glucose-Capillary: 99 mg/dL (ref 70–99)

## 2022-08-12 MED ORDER — CHLORHEXIDINE GLUCONATE CLOTH 2 % EX PADS
6.0000 | MEDICATED_PAD | Freq: Every day | CUTANEOUS | Status: DC
  Administered 2022-08-13: 6 via TOPICAL

## 2022-08-12 NOTE — Progress Notes (Signed)
PROGRESS NOTE    Edward [name]  JXB:[id number] DOB: [birth year] DOA: 08/05/2022 PCP: [name], MD   Brief Narrative:  51 y.o. male with medical history significant of ESRD on HD, PVD status post multiple amputations including right AKA, and multiple fingers amputation, and left transmetatarsal amputation, with chronic left leg and foot and left shin wound, IDDM, combined chronic combined CHF with LVEF 45%, chronic orthostatic hypotension, recurrent hyperkalemia, presented with worsening nausea, vomiting and dizziness.  On presentation, he was bradycardic and hypotensive: Blood pressure responded to a small IV bolus of 100 ml normal saline.  Potassium of 7, creatinine of 8.8.  EKG showed sinus bradycardia with T wave.  He was given Lokelma, D50 and insulin and underwent emergent hemodialysis.  Vascular surgery and orthopedic surgery were consulted.  He underwent left AKA on 08/07/2022 by orthopedics.  Assessment & Plan:   Left lower extremity dry gangrene now status post left AKA/sepsis present on admission/lactic acidosis Seen by vascular surgery who did not feel that patient had any revascularization options.  AKA was recommended.   Was seen by Dr. Sharol Given and underwent AKA on 12/20.  Wound VAC in place.  To be discharged with wound VAC.  To follow-up with Dr. Sharol Given in 1 week after discharge. Antibiotics have been discontinued. Pain control. Physical therapy is following.  End-stage renal disease on hemodialysis/Hyperkalemia/Acute metabolic acidosis -Presented with potassium of 7.  Required oral Lokelma, D50 and insulin and underwent emergent hemodialysis.   Nephrology continues to follow.   Dialysis as per nephrology.    Hypotension Borderline low blood pressures have been noted.  Thought to be due to narcotics.  Not on any blood pressure lowering agents at this time.   Has not received any intravenous narcotics in the last 72 hours.  Has been receiving oxycodone. Blood pressure appears to  have improved slightly.  Continue to monitor for now.    Anemia of chronic disease Hemoglobin is stable.  No evidence for overt bleeding.  Hyponatremia Resolved  Acute transaminitis/Cholelithiasis LFTs have improved. Right upper quadrant ultrasound showed cholelithiasis with gallbladder wall thickening without sonographic Murphy.  CBD not dilated.   Hepatitis panel negative.  CAD/Chronic combined heart failure Volume managed by dialysis. Last echocardiogram in our system was from 2022 and his EF is noted to be 50 to 55%.  Grade 2 diastolic dysfunction was noted.  Diabetes mellitus type 2 with hyperglycemia Monitor blood sugars.  Continue linagliptin.  CBGs with SSI.  HbA1c 6.3 in September.  Constipation Patient noted to be on Linzess and Colace.  He mentioned that he has been having loose stools.  But does not frequent stools.  He has been having only 1 more bowel movement per day according to charting but it has been loose.  We will discontinue the Colace.  Linzess is already at the lowest dose.  Monitor for now.  Goals of care Palliative care consultation appreciated: Currently remains full code  DVT prophylaxis: Heparin subcutaneous Code Status: Full Family Communication: None at bedside Disposition Plan: Inpatient rehab   Status is: Inpatient Remains inpatient appropriate because: Left AKA.  Needs inpatient rehab  Consultants: Nephrology/vascular surgery/orthopedics.  Palliative care  Procedures: Left AKA on 08/07/2022  Antimicrobials: Cefepime, vancomycin and Flagyl from 08/06/2022-08/07/2022  Subjective: Patient complains of loose.  Denies any abdominal pain nausea or vomiting.  No shortness of breath.  Pain in the left lower extremity is well-controlled.  Objective: Vitals:   08/11/22 2200 08/12/22 0536 08/12/22 0538 08/12/22 0900  BP:  1$'03/62 99/77 99/77 'j$ 103/74  Pulse: 84 85 85 88  Resp: '17  17 19  '$ Temp: 98.3 F (36.8 C) 98.3 F (36.8 C) 98.3 F (36.8 C)    TempSrc: Oral Oral  Oral  SpO2:  94% 94% 94%  Weight:  82 kg    Height:        Intake/Output Summary (Last 24 hours) at 08/12/2022 0923 Last data filed at 08/11/2022 2200 Gross per 24 hour  Intake 477 ml  Output 0 ml  Net 477 ml    Filed Weights   08/10/22 1255 08/10/22 1323 08/12/22 0536  Weight: 76.7 kg 76.7 kg 82 kg    Examination:  General appearance: Awake alert.  In no distress Resp: Clear to auscultation bilaterally.  Normal effort Cardio: S1-S2 is normal regular.  No S3-S4.  No rubs murmurs or bruit GI: Abdomen is soft.  Nontender nondistended.  Bowel sounds are present normal.  No masses organomegaly Extremities: Bilateral AKA.  Wound VAC noted in the left lower extremity.   Data Reviewed: I have personally reviewed following labs and imaging studies  CBC: Recent Labs  Lab 08/05/22 1240 08/05/22 1248 08/07/22 0321 08/08/22 0435 08/09/22 0315 08/10/22 0240 08/10/22 0858  WBC 12.0*   < > 9.3 9.5 11.6* 9.7 8.1  NEUTROABS 9.7*  --  7.8* 7.9* 9.2* 7.4  --   HGB 11.1*   < > 9.8* 8.9* 8.9* 9.6* 8.7*  HCT 36.3*   < > 31.6* 28.8* 28.1* 29.9* 27.4*  MCV 98.1   < > 94.6 95.0 94.9 93.7 93.5  PLT 270   < > 213 176 154 169 184   < > = values in this interval not displayed.    Basic Metabolic Panel: Recent Labs  Lab 08/05/22 1240 08/05/22 1248 08/06/22 0344 08/07/22 0321 08/08/22 0435 08/09/22 0315 08/09/22 1030 08/10/22 0240  NA 139   < > 133* 136 135 137  --  135  K 7.0*   < > 3.7 3.8 4.0 4.5  --  4.5  CL 92*   < > 94* 96* 95* 97*  --  95*  CO2 12*  --  '22 22 24 22  '$ --  21*  GLUCOSE 105*   < > 148* 157* 195* 156*  --  191*  BUN 71*   < > 36* 30* 48* 30*  --  62*  CREATININE 8.84*   < > 4.83* 4.37* 6.44* 4.72*  --  6.29*  CALCIUM 10.2  --  8.6* 8.6* 8.4* 8.7*  --  8.7*  MG 2.8*  --   --   --   --   --   --   --   PHOS  --   --   --   --   --   --  4.9*  --    < > = values in this interval not displayed.    GFR: Estimated Creatinine Clearance:  15.5 mL/min (A) (by C-G formula based on SCr of 6.29 mg/dL (H)). Liver Function Tests: Recent Labs  Lab 08/06/22 0344 08/07/22 0321 08/08/22 1401 08/09/22 0315 08/10/22 0240  AST 333* 451* 113* 48* 30  ALT 155* 273* 138* 59* 28  ALKPHOS 141* 132* 114 102 111  BILITOT 1.1 1.2 0.9 1.1 1.5*  PROT 6.8 6.5 6.4* 6.4* 6.8  ALBUMIN 2.7* 2.4* 2.3* 2.5* 2.6*    Recent Labs  Lab 08/05/22 1240  LIPASE 24    Cardiac Enzymes: Recent Labs  Lab 08/05/22 1318  CKTOTAL  221    CBG: Recent Labs  Lab 08/11/22 0753 08/11/22 1122 08/11/22 1747 08/12/22 0317 08/12/22 0913  GLUCAP 127* 120* 169* 160* 140*     Sepsis Labs: Recent Labs  Lab 08/05/22 2105 08/06/22 0344 08/07/22 0321  PROCALCITON 11.58 13.54 10.91  LATICACIDVEN 7.1* 2.8*  --      Recent Results (from the past 240 hour(s))  Resp panel by RT-PCR (RSV, Flu A&B, Covid) Anterior Nasal Swab     Status: None   Collection Time: 08/05/22 12:39 PM   Specimen: Anterior Nasal Swab  Result Value Ref Range Status   SARS Coronavirus 2 by RT PCR NEGATIVE NEGATIVE Final    Comment: (NOTE) SARS-CoV-2 target nucleic acids are NOT DETECTED.  The SARS-CoV-2 RNA is generally detectable in upper respiratory specimens during the acute phase of infection. The lowest concentration of SARS-CoV-2 viral copies this assay can detect is 138 copies/mL. A negative result does not preclude SARS-Cov-2 infection and should not be used as the sole basis for treatment or other patient management decisions. A negative result may occur with  improper specimen collection/handling, submission of specimen other than nasopharyngeal swab, presence of viral mutation(s) within the areas targeted by this assay, and inadequate number of viral copies(<138 copies/mL). A negative result must be combined with clinical observations, patient history, and epidemiological information. The expected result is Negative.  Fact Sheet for Patients:   EntrepreneurPulse.com.au  Fact Sheet for Healthcare Providers:  IncredibleEmployment.be  This test is no t yet approved or cleared by the [place] FDA and  has been authorized for detection and/or diagnosis of SARS-CoV-2 by FDA under an Emergency Use Authorization (EUA). This EUA will remain  in effect (meaning this test can be used) for the duration of the COVID-19 declaration under Section 564(b)(1) of the Act, 21 U.S.C.section 360bbb-3(b)(1), unless the authorization is terminated  or revoked sooner.       Influenza A by PCR NEGATIVE NEGATIVE Final   Influenza B by PCR NEGATIVE NEGATIVE Final    Comment: (NOTE) The Xpert Xpress SARS-CoV-2/FLU/RSV plus assay is intended as an aid in the diagnosis of influenza from Nasopharyngeal swab specimens and should not be used as a sole basis for treatment. Nasal washings and aspirates are unacceptable for Xpert Xpress SARS-CoV-2/FLU/RSV testing.  Fact Sheet for Patients: EntrepreneurPulse.com.au  Fact Sheet for Healthcare Providers: IncredibleEmployment.be  This test is not yet approved or cleared by the [place] FDA and has been authorized for detection and/or diagnosis of SARS-CoV-2 by FDA under an Emergency Use Authorization (EUA). This EUA will remain in effect (meaning this test can be used) for the duration of the COVID-19 declaration under Section 564(b)(1) of the Act, 21 U.S.C. section 360bbb-3(b)(1), unless the authorization is terminated or revoked.     Resp Syncytial Virus by PCR NEGATIVE NEGATIVE Final    Comment: (NOTE) Fact Sheet for Patients: EntrepreneurPulse.com.au  Fact Sheet for Healthcare Providers: IncredibleEmployment.be  This test is not yet approved or cleared by the [place] FDA and has been authorized for detection and/or diagnosis of SARS-CoV-2 by FDA under an Emergency Use  Authorization (EUA). This EUA will remain in effect (meaning this test can be used) for the duration of the COVID-19 declaration under Section 564(b)(1) of the Act, 21 U.S.C. section 360bbb-3(b)(1), unless the authorization is terminated or revoked.  Performed at [facility], [place] [address], [place], [place] [address]   Culture, blood (Routine X 2) w Reflex to ID Panel  Status: None   Collection Time: 08/05/22  9:05 PM   Specimen: BLOOD RIGHT ARM  Result Value Ref Range Status   Specimen Description BLOOD RIGHT ARM  Final   Special Requests   Final    BOTTLES DRAWN AEROBIC AND ANAEROBIC Blood Culture adequate volume   Culture   Final    NO GROWTH 5 DAYS Performed at [facility], [address], [place], [place] [address]    Report Status 08/10/2022 FINAL  Final  Culture, blood (Routine X 2) w Reflex to ID Panel     Status: None   Collection Time: 08/05/22  9:05 PM   Specimen: BLOOD RIGHT ARM  Result Value Ref Range Status   Specimen Description BLOOD RIGHT ARM  Final   Special Requests   Final    BOTTLES DRAWN AEROBIC AND ANAEROBIC Blood Culture adequate volume   Culture   Final    NO GROWTH 5 DAYS Performed at [facility], [place] [address] [place], [place], [place] [address]    Report Status 08/10/2022 FINAL  Final  MRSA Next Gen by PCR, Nasal     Status: None   Collection Time: 08/06/22 12:50 AM   Specimen: Nasal Mucosa; Nasal Swab  Result Value Ref Range Status   MRSA by PCR Next Gen NOT DETECTED NOT DETECTED Final    Comment: (NOTE) The GeneXpert MRSA Assay (FDA approved for NASAL specimens only), is one component of a comprehensive MRSA colonization surveillance program. It is not intended to diagnose MRSA infection nor to guide or monitor treatment for MRSA infections. Test performance is not FDA approved in patients less than 5 years old. Performed at [facility], [place] [address] [place],  [address]       Radiology Studies: No  results found.   Scheduled Meds:  (feeding supplement) PROSource Plus  30 mL Oral BID BM   ALPRAZolam  0.25 mg Oral Daily   vitamin C  1,000 mg Oral Daily   aspirin EC  81 mg Oral Daily   Chlorhexidine Gluconate Cloth  6 each Topical Q0600   Chlorhexidine Gluconate Cloth  6 each Topical Q0600   Chlorhexidine Gluconate Cloth  6 each Topical Q0600   Chlorhexidine Gluconate Cloth  6 each Topical Q0600   clopidogrel  75 mg Oral Daily   feeding supplement (NEPRO CARB STEADY)  237 mL Oral BID BM   gabapentin  100 mg Oral TID   heparin  5,000 Units Subcutaneous Q12H   insulin aspart  0-9 Units Subcutaneous TID WC   linaclotide  72 mcg Oral q AM   linagliptin  5 mg Oral Daily   midodrine  10 mg Oral TID WC   nutrition supplement (JUVEN)  1 packet Oral BID BM   pantoprazole  40 mg Oral Daily   sodium bicarbonate  650 mg Oral BID   sodium chloride flush  3 mL Intravenous Q12H   sucroferric oxyhydroxide  1,000 mg Oral BID WC   zinc sulfate  220 mg Oral Daily   Continuous Infusions:  sodium chloride         Bonnielee Haff, MD [facility] 08/12/2022, 9:23 AM

## 2022-08-12 NOTE — Progress Notes (Signed)
[name] Progress Note   Subjective: no new c/o's today  Objective Vitals:   08/11/22 2200 08/12/22 0536 08/12/22 0538 08/12/22 0900  BP: 1'03/62 99/77 99/77 '$ 103/74  Pulse: 84 85 85 88  Resp: '17  17 19  '$ Temp: 98.3 F (36.8 C) 98.3 F (36.8 C) 98.3 F (36.8 C)   TempSrc: Oral Oral  Oral  SpO2:  94% 94% 94%  Weight:  82 kg    Height:       Physical Exam General: Chronically ill appearing male in  NAD  Heart: SR 90s. S1,S2 no M/R/G Lungs: CTAB A/P Abdomen: soft, NABS Extremities: wound vac L AKA, old R AKA intact w/o edema. Sp amputated fingers both hands.  Dialysis Access: RIJ TDC drsg intact.   [facility]  4.5h  425/ 700   89kg  2/2 bath  TDC  Hep 8000+ 2027md - mircera 1541m q2, last 12/16, due 12/30 - venofer '50mg'$  IV weekly - hectorol 1076mIV TIW - korsuva for itching  - velphoro 3 tabs with meals- last phos 12.5   Assessment/Plan:  L foot wound - S/P L AKA per Dr. DudSharol Given/20 ESRD -  TTS HD. OP Heparin dose too high for current wt. Lower dose on discharge. HD tomorrow.  Hypotension/volume: BP remains soft, has hx of hypotension at OP Clinic as well. Started midodrine here at 10 mg tid. Euvolemic and several kg under EDW. Will need lower EDW on discharge post AKA. No BP lowering meds. Last UF was zero.   Anemia: Hgb 8.7. ESA due 08/17/2022.   Metabolic bone disease: Calcium elevated, holding hectorol for now. Very noncomplaint with binders/diet, last phos was 12.5, probably exacerbating his pruritus. Resumed binders here and last phos was down to 4.9.   Nutrition:  Renal diet. Very low albumin. Add protein supps.  Abnormal LFT's - RUQ US [place] cholelithiasis and gallbladder wall thickening.  Negative US [place] sign, acute hepatitis panel negative.  Per primary.  Bradycardia: Resolved Hyperkalemia:Resolved.   RobKelly SplinterD 08/12/2022, 3:28 PM  Recent Labs  Lab 08/09/22 0315 08/09/22 1030 08/10/22 0240 08/10/22 0858  HGB 8.9*  --   9.6* 8.7*  ALBUMIN 2.5*  --  2.6*  --   CALCIUM 8.7*  --  8.7*  --   PHOS  --  4.9*  --   --   CREATININE 4.72*  --  6.29*  --   K 4.5  --  4.5  --     Inpatient medications:  (feeding supplement) PROSource Plus  30 mL Oral BID BM   ALPRAZolam  0.25 mg Oral Daily   vitamin C  1,000 mg Oral Daily   aspirin EC  81 mg Oral Daily   Chlorhexidine Gluconate Cloth  6 each Topical Q0600   Chlorhexidine Gluconate Cloth  6 each Topical Q0600   Chlorhexidine Gluconate Cloth  6 each Topical Q0600   Chlorhexidine Gluconate Cloth  6 each Topical Q0600   clopidogrel  75 mg Oral Daily   feeding supplement (NEPRO CARB STEADY)  237 mL Oral BID BM   gabapentin  100 mg Oral TID   heparin  5,000 Units Subcutaneous Q12H   insulin aspart  0-9 Units Subcutaneous TID WC   linaclotide  72 mcg Oral q AM   linagliptin  5 mg Oral Daily   midodrine  10 mg Oral TID WC   nutrition supplement (JUVEN)  1 packet Oral BID BM   pantoprazole  40 mg Oral Daily  sodium bicarbonate  650 mg Oral BID   sodium chloride flush  3 mL Intravenous Q12H   sucroferric oxyhydroxide  1,000 mg Oral BID WC   zinc sulfate  220 mg Oral Daily    sodium chloride     sodium chloride, acetaminophen, bisacodyl, bismuth subsalicylate, camphor-menthol, guaiFENesin-dextromethorphan, morphine injection, ondansetron, mouth rinse, oxyCODONE, phenol, polyethylene glycol, sodium chloride flush

## 2022-08-12 NOTE — Progress Notes (Signed)
Tegaderm films placed on prevena seal. On call provider notified.

## 2022-08-13 ENCOUNTER — Other Ambulatory Visit: Payer: Self-pay

## 2022-08-13 ENCOUNTER — Encounter (HOSPITAL_COMMUNITY): Payer: Self-pay | Admitting: Physical Medicine and Rehabilitation

## 2022-08-13 ENCOUNTER — Inpatient Hospital Stay (HOSPITAL_COMMUNITY)
Admission: RE | Admit: 2022-08-13 | Discharge: 2022-08-23 | DRG: 559 | Disposition: A | Discharge status: Home Health | Payer: Medicare Other | Source: Intra-hospital | Attending: Physical Medicine and Rehabilitation | Admitting: Physical Medicine and Rehabilitation

## 2022-08-13 DIAGNOSIS — F419 Anxiety disorder, unspecified: Secondary | ICD-10-CM | POA: Diagnosis present

## 2022-08-13 DIAGNOSIS — N2581 Secondary hyperparathyroidism of renal origin: Secondary | ICD-10-CM | POA: Diagnosis not present

## 2022-08-13 DIAGNOSIS — Z992 Dependence on renal dialysis: Secondary | ICD-10-CM

## 2022-08-13 DIAGNOSIS — Z89612 Acquired absence of left leg above knee: Secondary | ICD-10-CM | POA: Diagnosis not present

## 2022-08-13 DIAGNOSIS — Z955 Presence of coronary angioplasty implant and graft: Secondary | ICD-10-CM

## 2022-08-13 DIAGNOSIS — E1151 Type 2 diabetes mellitus with diabetic peripheral angiopathy without gangrene: Secondary | ICD-10-CM | POA: Diagnosis not present

## 2022-08-13 DIAGNOSIS — R7401 Elevation of levels of liver transaminase levels: Secondary | ICD-10-CM | POA: Diagnosis not present

## 2022-08-13 DIAGNOSIS — T8744 Infection of amputation stump, left lower extremity: Secondary | ICD-10-CM | POA: Diagnosis not present

## 2022-08-13 DIAGNOSIS — I951 Orthostatic hypotension: Secondary | ICD-10-CM | POA: Diagnosis not present

## 2022-08-13 DIAGNOSIS — L03116 Cellulitis of left lower limb: Secondary | ICD-10-CM | POA: Diagnosis not present

## 2022-08-13 DIAGNOSIS — E8889 Other specified metabolic disorders: Secondary | ICD-10-CM | POA: Diagnosis not present

## 2022-08-13 DIAGNOSIS — Z89021 Acquired absence of right finger(s): Secondary | ICD-10-CM

## 2022-08-13 DIAGNOSIS — E1122 Type 2 diabetes mellitus with diabetic chronic kidney disease: Secondary | ICD-10-CM | POA: Diagnosis present

## 2022-08-13 DIAGNOSIS — Y835 Amputation of limb(s) as the cause of abnormal reaction of the patient, or of later complication, without mention of misadventure at the time of the procedure: Secondary | ICD-10-CM | POA: Diagnosis not present

## 2022-08-13 DIAGNOSIS — K8020 Calculus of gallbladder without cholecystitis without obstruction: Secondary | ICD-10-CM | POA: Diagnosis present

## 2022-08-13 DIAGNOSIS — I132 Hypertensive heart and chronic kidney disease with heart failure and with stage 5 chronic kidney disease, or end stage renal disease: Secondary | ICD-10-CM | POA: Diagnosis not present

## 2022-08-13 DIAGNOSIS — I130 Hypertensive heart and chronic kidney disease with heart failure and stage 1 through stage 4 chronic kidney disease, or unspecified chronic kidney disease: Secondary | ICD-10-CM | POA: Diagnosis not present

## 2022-08-13 DIAGNOSIS — E7800 Pure hypercholesterolemia, unspecified: Secondary | ICD-10-CM

## 2022-08-13 DIAGNOSIS — Z89611 Acquired absence of right leg above knee: Secondary | ICD-10-CM | POA: Diagnosis not present

## 2022-08-13 DIAGNOSIS — Z833 Family history of diabetes mellitus: Secondary | ICD-10-CM

## 2022-08-13 DIAGNOSIS — K5900 Constipation, unspecified: Secondary | ICD-10-CM | POA: Diagnosis present

## 2022-08-13 DIAGNOSIS — F418 Other specified anxiety disorders: Secondary | ICD-10-CM | POA: Diagnosis not present

## 2022-08-13 DIAGNOSIS — I2510 Atherosclerotic heart disease of native coronary artery without angina pectoris: Secondary | ICD-10-CM | POA: Diagnosis present

## 2022-08-13 DIAGNOSIS — R001 Bradycardia, unspecified: Secondary | ICD-10-CM | POA: Diagnosis present

## 2022-08-13 DIAGNOSIS — Z7984 Long term (current) use of oral hypoglycemic drugs: Secondary | ICD-10-CM

## 2022-08-13 DIAGNOSIS — D638 Anemia in other chronic diseases classified elsewhere: Secondary | ICD-10-CM | POA: Diagnosis not present

## 2022-08-13 DIAGNOSIS — E1129 Type 2 diabetes mellitus with other diabetic kidney complication: Secondary | ICD-10-CM | POA: Diagnosis not present

## 2022-08-13 DIAGNOSIS — R062 Wheezing: Secondary | ICD-10-CM | POA: Diagnosis not present

## 2022-08-13 DIAGNOSIS — Z888 Allergy status to other drugs, medicaments and biological substances status: Secondary | ICD-10-CM

## 2022-08-13 DIAGNOSIS — D631 Anemia in chronic kidney disease: Secondary | ICD-10-CM | POA: Diagnosis not present

## 2022-08-13 DIAGNOSIS — Z7401 Bed confinement status: Secondary | ICD-10-CM | POA: Diagnosis not present

## 2022-08-13 DIAGNOSIS — Z96642 Presence of left artificial hip joint: Secondary | ICD-10-CM | POA: Diagnosis present

## 2022-08-13 DIAGNOSIS — R197 Diarrhea, unspecified: Secondary | ICD-10-CM | POA: Diagnosis not present

## 2022-08-13 DIAGNOSIS — K219 Gastro-esophageal reflux disease without esophagitis: Secondary | ICD-10-CM | POA: Diagnosis present

## 2022-08-13 DIAGNOSIS — R059 Cough, unspecified: Secondary | ICD-10-CM | POA: Diagnosis not present

## 2022-08-13 DIAGNOSIS — Z4781 Encounter for orthopedic aftercare following surgical amputation: Principal | ICD-10-CM

## 2022-08-13 DIAGNOSIS — M109 Gout, unspecified: Secondary | ICD-10-CM | POA: Diagnosis present

## 2022-08-13 DIAGNOSIS — E875 Hyperkalemia: Secondary | ICD-10-CM | POA: Diagnosis present

## 2022-08-13 DIAGNOSIS — N184 Chronic kidney disease, stage 4 (severe): Secondary | ICD-10-CM | POA: Diagnosis not present

## 2022-08-13 DIAGNOSIS — L299 Pruritus, unspecified: Secondary | ICD-10-CM | POA: Diagnosis present

## 2022-08-13 DIAGNOSIS — Z79899 Other long term (current) drug therapy: Secondary | ICD-10-CM

## 2022-08-13 DIAGNOSIS — Z515 Encounter for palliative care: Secondary | ICD-10-CM

## 2022-08-13 DIAGNOSIS — E1142 Type 2 diabetes mellitus with diabetic polyneuropathy: Secondary | ICD-10-CM | POA: Diagnosis present

## 2022-08-13 DIAGNOSIS — R531 Weakness: Secondary | ICD-10-CM | POA: Diagnosis not present

## 2022-08-13 DIAGNOSIS — Z89022 Acquired absence of left finger(s): Secondary | ICD-10-CM

## 2022-08-13 DIAGNOSIS — F32A Depression, unspecified: Secondary | ICD-10-CM | POA: Diagnosis not present

## 2022-08-13 DIAGNOSIS — Z7189 Other specified counseling: Secondary | ICD-10-CM | POA: Diagnosis not present

## 2022-08-13 DIAGNOSIS — Z8673 Personal history of transient ischemic attack (TIA), and cerebral infarction without residual deficits: Secondary | ICD-10-CM

## 2022-08-13 DIAGNOSIS — E1152 Type 2 diabetes mellitus with diabetic peripheral angiopathy with gangrene: Secondary | ICD-10-CM

## 2022-08-13 DIAGNOSIS — Z800 Family history of malignant neoplasm of digestive organs: Secondary | ICD-10-CM

## 2022-08-13 DIAGNOSIS — R110 Nausea: Secondary | ICD-10-CM | POA: Diagnosis not present

## 2022-08-13 DIAGNOSIS — Z794 Long term (current) use of insulin: Secondary | ICD-10-CM

## 2022-08-13 DIAGNOSIS — N250 Renal osteodystrophy: Secondary | ICD-10-CM | POA: Diagnosis not present

## 2022-08-13 DIAGNOSIS — E785 Hyperlipidemia, unspecified: Secondary | ICD-10-CM | POA: Diagnosis present

## 2022-08-13 DIAGNOSIS — Z87891 Personal history of nicotine dependence: Secondary | ICD-10-CM

## 2022-08-13 DIAGNOSIS — R0981 Nasal congestion: Secondary | ICD-10-CM | POA: Diagnosis not present

## 2022-08-13 DIAGNOSIS — I509 Heart failure, unspecified: Secondary | ICD-10-CM | POA: Diagnosis not present

## 2022-08-13 DIAGNOSIS — Z7902 Long term (current) use of antithrombotics/antiplatelets: Secondary | ICD-10-CM

## 2022-08-13 DIAGNOSIS — E8779 Other fluid overload: Secondary | ICD-10-CM | POA: Diagnosis not present

## 2022-08-13 DIAGNOSIS — Z7982 Long term (current) use of aspirin: Secondary | ICD-10-CM

## 2022-08-13 DIAGNOSIS — J811 Chronic pulmonary edema: Secondary | ICD-10-CM | POA: Diagnosis not present

## 2022-08-13 DIAGNOSIS — I252 Old myocardial infarction: Secondary | ICD-10-CM

## 2022-08-13 DIAGNOSIS — N186 End stage renal disease: Secondary | ICD-10-CM | POA: Diagnosis not present

## 2022-08-13 DIAGNOSIS — I5042 Chronic combined systolic (congestive) and diastolic (congestive) heart failure: Secondary | ICD-10-CM | POA: Diagnosis present

## 2022-08-13 LAB — RENAL FUNCTION PANEL
Albumin: 2.5 g/dL — ABNORMAL LOW (ref 3.5–5.0)
Anion gap: 22 — ABNORMAL HIGH (ref 5–15)
BUN: 78 mg/dL — ABNORMAL HIGH (ref 6–20)
CO2: 20 mmol/L — ABNORMAL LOW (ref 22–32)
Calcium: 8.7 mg/dL — ABNORMAL LOW (ref 8.9–10.3)
Chloride: 96 mmol/L — ABNORMAL LOW (ref 98–111)
Creatinine, Ser: 7.96 mg/dL — ABNORMAL HIGH (ref 0.61–1.24)
GFR, Estimated: 8 mL/min — ABNORMAL LOW (ref >60)
Glucose, Bld: 145 mg/dL — ABNORMAL HIGH (ref 70–99)
Phosphorus: 5.7 mg/dL — ABNORMAL HIGH (ref 2.5–4.6)
Potassium: 4.5 mmol/L (ref 3.5–5.1)
Sodium: 138 mmol/L (ref 135–145)

## 2022-08-13 LAB — GLUCOSE, CAPILLARY
Glucose-Capillary: 123 mg/dL — ABNORMAL HIGH (ref 70–99)
Glucose-Capillary: 128 mg/dL — ABNORMAL HIGH (ref 70–99)
Glucose-Capillary: 134 mg/dL — ABNORMAL HIGH (ref 70–99)
Glucose-Capillary: 148 mg/dL — ABNORMAL HIGH (ref 70–99)
Glucose-Capillary: 164 mg/dL — ABNORMAL HIGH (ref 70–99)

## 2022-08-13 LAB — CBC
HCT: 31.2 % — ABNORMAL LOW (ref 39.0–52.0)
Hemoglobin: 9.5 g/dL — ABNORMAL LOW (ref 13.0–17.0)
MCH: 29.2 pg (ref 26.0–34.0)
MCHC: 30.4 g/dL (ref 30.0–36.0)
MCV: 96 fL (ref 80.0–100.0)
Platelets: 217 10*3/uL (ref 150–400)
RBC: 3.25 MIL/uL — ABNORMAL LOW (ref 4.22–5.81)
RDW: 18.8 % — ABNORMAL HIGH (ref 11.5–15.5)
WBC: 13.2 10*3/uL — ABNORMAL HIGH (ref 4.0–10.5)
nRBC: 0 % (ref 0.0–0.2)

## 2022-08-13 MED ORDER — HEPARIN SODIUM (PORCINE) 5000 UNIT/ML IJ SOLN: 5000.0000 [IU] | Freq: Two times a day (BID) | INTRAMUSCULAR | Status: DC

## 2022-08-13 MED ORDER — ALTEPLASE 2 MG IJ SOLR: 2.0000 mg | Freq: Once | INTRAMUSCULAR | Status: DC | PRN

## 2022-08-13 MED ORDER — NEPRO/CARBSTEADY PO LIQD
237.0000 mL | Freq: Two times a day (BID) | ORAL | Status: DC
  Administered 2022-08-14: 237 mL via ORAL

## 2022-08-13 MED ORDER — HEPARIN SODIUM (PORCINE) 1000 UNIT/ML IJ SOLN
INTRAMUSCULAR | Status: AC
  Administered 2022-08-13: 3800 [IU]
  Filled 2022-08-13: qty 4

## 2022-08-13 MED ORDER — ZINC SULFATE 220 (50 ZN) MG PO CAPS
220.0000 mg | ORAL_CAPSULE | Freq: Every day | ORAL | Status: AC
  Administered 2022-08-14 – 2022-08-20 (×6): 220 mg via ORAL
  Filled 2022-08-13 (×6): qty 1

## 2022-08-13 MED ORDER — HEPARIN SODIUM (PORCINE) 1000 UNIT/ML DIALYSIS
1000.0000 [IU] | INTRAMUSCULAR | Status: DC | PRN
  Administered 2022-08-13: 1000 [IU]
  Filled 2022-08-13: qty 1

## 2022-08-13 MED ORDER — CAMPHOR-MENTHOL 0.5-0.5 % EX LOTN: TOPICAL_LOTION | CUTANEOUS | Status: DC | PRN

## 2022-08-13 MED ORDER — ASPIRIN 81 MG PO TBEC
81.0000 mg | DELAYED_RELEASE_TABLET | Freq: Every day | ORAL | Status: DC
  Administered 2022-08-14 – 2022-08-23 (×9): 81 mg via ORAL
  Filled 2022-08-13 (×9): qty 1

## 2022-08-13 MED ORDER — POLYETHYLENE GLYCOL 3350 17 G PO PACK: 17.0000 g | PACK | Freq: Every day | ORAL | Status: DC | PRN

## 2022-08-13 MED ORDER — GABAPENTIN 100 MG PO CAPS
100.0000 mg | ORAL_CAPSULE | Freq: Three times a day (TID) | ORAL | Status: DC
  Administered 2022-08-13 – 2022-08-23 (×25): 100 mg via ORAL
  Filled 2022-08-13 (×25): qty 1

## 2022-08-13 MED ORDER — SUCROFERRIC OXYHYDROXIDE 500 MG PO CHEW
1000.0000 mg | CHEWABLE_TABLET | Freq: Two times a day (BID) | ORAL | Status: DC
  Administered 2022-08-14 – 2022-08-18 (×8): 1000 mg via ORAL
  Filled 2022-08-13 (×11): qty 2

## 2022-08-13 MED ORDER — OXYCODONE HCL 5 MG PO TABS
5.0000 mg | ORAL_TABLET | ORAL | Status: DC | PRN
  Administered 2022-08-13 – 2022-08-14 (×3): 5 mg via ORAL
  Filled 2022-08-13 (×2): qty 1

## 2022-08-13 MED ORDER — LIDOCAINE-PRILOCAINE 2.5-2.5 % EX CREA: 1.0000 | TOPICAL_CREAM | CUTANEOUS | Status: DC | PRN

## 2022-08-13 MED ORDER — HEPARIN SODIUM (PORCINE) 5000 UNIT/ML IJ SOLN
5000.0000 [IU] | Freq: Two times a day (BID) | INTRAMUSCULAR | Status: DC
  Administered 2022-08-13 – 2022-08-23 (×19): 5000 [IU] via SUBCUTANEOUS
  Filled 2022-08-13 (×19): qty 1

## 2022-08-13 MED ORDER — INSULIN ASPART 100 UNIT/ML IJ SOLN
0.0000 [IU] | Freq: Three times a day (TID) | INTRAMUSCULAR | Status: DC
  Administered 2022-08-14: 1 [IU] via SUBCUTANEOUS
  Administered 2022-08-14 – 2022-08-16 (×4): 2 [IU] via SUBCUTANEOUS
  Administered 2022-08-17: 1 [IU] via SUBCUTANEOUS
  Administered 2022-08-17 (×2): 2 [IU] via SUBCUTANEOUS
  Administered 2022-08-18 (×2): 1 [IU] via SUBCUTANEOUS
  Administered 2022-08-19 (×3): 2 [IU] via SUBCUTANEOUS
  Administered 2022-08-20: 3 [IU] via SUBCUTANEOUS
  Administered 2022-08-20: 2 [IU] via SUBCUTANEOUS
  Administered 2022-08-21: 3 [IU] via SUBCUTANEOUS
  Administered 2022-08-21: 2 [IU] via SUBCUTANEOUS
  Administered 2022-08-22: 1 [IU] via SUBCUTANEOUS
  Administered 2022-08-22: 2 [IU] via SUBCUTANEOUS
  Administered 2022-08-23: 1 [IU] via SUBCUTANEOUS

## 2022-08-13 MED ORDER — ACETAMINOPHEN 325 MG PO TABS
325.0000 mg | ORAL_TABLET | Freq: Four times a day (QID) | ORAL | Status: DC | PRN
  Administered 2022-08-21 – 2022-08-22 (×2): 650 mg via ORAL
  Filled 2022-08-13 (×2): qty 2

## 2022-08-13 MED ORDER — PENTAFLUOROPROP-TETRAFLUOROETH EX AERO: 1.0000 | INHALATION_SPRAY | CUTANEOUS | Status: DC | PRN

## 2022-08-13 MED ORDER — MIDODRINE HCL 5 MG PO TABS
10.0000 mg | ORAL_TABLET | Freq: Three times a day (TID) | ORAL | Status: DC
  Administered 2022-08-14 – 2022-08-23 (×24): 10 mg via ORAL
  Filled 2022-08-13 (×26): qty 2

## 2022-08-13 MED ORDER — BISACODYL 5 MG PO TBEC: 5.0000 mg | DELAYED_RELEASE_TABLET | Freq: Every day | ORAL | Status: DC | PRN

## 2022-08-13 MED ORDER — ALPRAZOLAM 0.25 MG PO TABS
0.2500 mg | ORAL_TABLET | Freq: Every day | ORAL | Status: DC
  Administered 2022-08-14: 0.25 mg via ORAL
  Filled 2022-08-13: qty 1

## 2022-08-13 MED ORDER — LINACLOTIDE 72 MCG PO CAPS
72.0000 ug | ORAL_CAPSULE | Freq: Every morning | ORAL | Status: DC
  Administered 2022-08-15 – 2022-08-23 (×4): 72 ug via ORAL
  Filled 2022-08-13 (×10): qty 1

## 2022-08-13 MED ORDER — LINAGLIPTIN 5 MG PO TABS
5.0000 mg | ORAL_TABLET | Freq: Every day | ORAL | Status: DC
  Administered 2022-08-14 – 2022-08-23 (×9): 5 mg via ORAL
  Filled 2022-08-13 (×10): qty 1

## 2022-08-13 MED ORDER — SODIUM BICARBONATE 650 MG PO TABS
650.0000 mg | ORAL_TABLET | Freq: Two times a day (BID) | ORAL | Status: DC
  Administered 2022-08-13 – 2022-08-21 (×15): 650 mg via ORAL
  Filled 2022-08-13 (×15): qty 1

## 2022-08-13 MED ORDER — PROSOURCE PLUS PO LIQD
30.0000 mL | Freq: Two times a day (BID) | ORAL | Status: DC
  Filled 2022-08-13: qty 30

## 2022-08-13 MED ORDER — HEPARIN SODIUM (PORCINE) 1000 UNIT/ML DIALYSIS: 3000.0000 [IU] | INTRAMUSCULAR | Status: DC | PRN

## 2022-08-13 MED ORDER — JUVEN PO PACK
1.0000 | PACK | Freq: Two times a day (BID) | ORAL | Status: DC
  Administered 2022-08-14 – 2022-08-18 (×3): 1 via ORAL
  Filled 2022-08-13 (×7): qty 1

## 2022-08-13 MED ORDER — PANTOPRAZOLE SODIUM 40 MG PO TBEC
40.0000 mg | DELAYED_RELEASE_TABLET | Freq: Every day | ORAL | Status: DC
  Administered 2022-08-14 – 2022-08-23 (×9): 40 mg via ORAL
  Filled 2022-08-13 (×9): qty 1

## 2022-08-13 MED ORDER — HEPARIN SODIUM (PORCINE) 1000 UNIT/ML DIALYSIS
1000.0000 [IU] | INTRAMUSCULAR | Status: DC | PRN
  Administered 2022-08-18: 3800 [IU]
  Administered 2022-08-20: 1000 [IU]
  Filled 2022-08-13 (×3): qty 1

## 2022-08-13 MED ORDER — BISMUTH SUBSALICYLATE 262 MG/15ML PO SUSP
30.0000 mL | Freq: Four times a day (QID) | ORAL | Status: DC | PRN
  Administered 2022-08-22: 30 mL via ORAL
  Filled 2022-08-13: qty 236

## 2022-08-13 MED ORDER — VITAMIN C 500 MG PO TABS
1000.0000 mg | ORAL_TABLET | Freq: Every day | ORAL | Status: DC
  Administered 2022-08-14 – 2022-08-23 (×9): 1000 mg via ORAL
  Filled 2022-08-13 (×9): qty 2

## 2022-08-13 MED ORDER — LIDOCAINE HCL (PF) 1 % IJ SOLN: 5.0000 mL | INTRAMUSCULAR | Status: DC | PRN

## 2022-08-13 MED ORDER — CLOPIDOGREL BISULFATE 75 MG PO TABS
75.0000 mg | ORAL_TABLET | Freq: Every day | ORAL | Status: DC
  Administered 2022-08-14 – 2022-08-23 (×9): 75 mg via ORAL
  Filled 2022-08-13 (×9): qty 1

## 2022-08-13 MED ORDER — ANTICOAGULANT SODIUM CITRATE 4% (200MG/5ML) IV SOLN: 5.0000 mL | Status: DC | PRN

## 2022-08-13 NOTE — Discharge Summary (Signed)
[facility]  Physician Discharge Summary   Patient ID: Edward [name] MRN: [id number] DOB/AGE: 1970/09/17 51 y.o.  Admit date: 08/05/2022 Discharge date:   08/13/2022   PCP: [name], MD  DISCHARGE DIAGNOSES:    Hyperkalemia   Diabetes mellitus with complication, with long-term current use of insulin (HCC)   Essential hypertension   PVD (peripheral vascular disease) ([place])   Transaminitis   ESRD on hemodialysis (HCC)   Nausea and vomiting   PAD (peripheral artery disease) ([place])   Diabetic foot ulcer ([place])   Atherosclerosis of native arteries of extremities with gangrene, left leg ([place])  PATIENT BEING DISCHARGED TO [facility]  RECOMMENDATIONS FOR OUTPATIENT FOLLOW UP: Patient will need to be seen by Dr. Sharol Given in 1 week after discharge.  May need to reach out to him while patient is at inpatient rehabilitation.   CODE STATUS: Full code  DISCHARGE CONDITION: fair  Diet recommendation: Renal diet  INITIAL HISTORY: 51 y.o. male with medical history significant of ESRD on HD, PVD status post multiple amputations including right AKA, and multiple fingers amputation, and left transmetatarsal amputation, with chronic left leg and foot and left shin wound, IDDM, combined chronic combined CHF with LVEF 45%, chronic orthostatic hypotension, recurrent hyperkalemia, presented with worsening nausea, vomiting and dizziness.  On presentation, he was bradycardic and hypotensive: Blood pressure responded to a small IV bolus of 100 ml normal saline.  Potassium of 7, creatinine of 8.8.  EKG showed sinus bradycardia with T wave.  He was given Lokelma, D50 and insulin and underwent emergent hemodialysis.  Vascular surgery and orthopedic surgery were consulted.  He underwent left AKA on 08/07/2022 by orthopedics.    HOSPITAL COURSE:   Left lower extremity dry gangrene now status post left AKA/sepsis present on admission/lactic acidosis Seen by vascular surgery  who did not feel that patient had any revascularization options.  AKA was recommended.   Was seen by Dr. Sharol Given and underwent AKA on 12/20.  Wound VAC in place.  To be discharged with wound VAC.  To follow-up with Dr. Sharol Given in 1 week after discharge. Antibiotics have been discontinued. Pain control. Physical therapy is following.  Inpatient rehabilitation is recommended. Had an episode of nausea yesterday.  Abdomen is benign.  Appetite is poor.     End-stage renal disease on hemodialysis/Hyperkalemia/Acute metabolic acidosis Presented with potassium of 7.  Required oral Lokelma, D50 and insulin and underwent emergent hemodialysis.   Nephrology continues to follow.   Dialysis as per nephrology.     Hypotension Borderline low blood pressures have been noted.  Thought to be due to narcotics.  Not on any blood pressure lowering agents at this time.  Has not received intravenous narcotics in several days.  Has been receiving oxycodone. Blood pressure is low but stable.  He remains asymptomatic.     Anemia of chronic disease Hemoglobin is stable.  No evidence for overt bleeding.   Hyponatremia Resolved   Acute transaminitis/Cholelithiasis LFTs have improved. Right upper quadrant ultrasound showed cholelithiasis with gallbladder wall thickening without sonographic Murphy.  CBD not dilated.   Hepatitis panel negative.   CAD/Chronic combined heart failure Volume managed by dialysis. Last echocardiogram in our system was from 2022 and his EF is noted to be 50 to 55%.  Grade 2 diastolic dysfunction was noted.   Diabetes mellitus type 2 with hyperglycemia Monitor blood sugars.  Continue linagliptin.  CBGs with SSI.  HbA1c 6.3 in September.   Constipation Patient noted to be  on Linzess and Colace.  He mentioned that he has been having loose stools.  But not frequent stools.  He has been having only 1 more bowel movement per day according to charting but it has been loose.  Colace was discontinued.   Linzess is being continued for now.     Goals of care Palliative care consultation appreciated: Currently remains full code    Patient is stable.  Okay for discharge to inpatient rehabilitation.   PERTINENT LABS:  The results of significant diagnostics from this hospitalization (including imaging, microbiology, ancillary and laboratory) are listed below for reference.    Microbiology: Recent Results (from the past 240 hour(s))  Resp panel by RT-PCR (RSV, Flu A&B, Covid) Anterior Nasal Swab     Status: None   Collection Time: 08/05/22 12:39 PM   Specimen: Anterior Nasal Swab  Result Value Ref Range Status   SARS Coronavirus 2 by RT PCR NEGATIVE NEGATIVE Final    Comment: (NOTE) SARS-CoV-2 target nucleic acids are NOT DETECTED.  The SARS-CoV-2 RNA is generally detectable in upper respiratory specimens during the acute phase of infection. The lowest concentration of SARS-CoV-2 viral copies this assay can detect is 138 copies/mL. A negative result does not preclude SARS-Cov-2 infection and should not be used as the sole basis for treatment or other patient management decisions. A negative result may occur with  improper specimen collection/handling, submission of specimen other than nasopharyngeal swab, presence of viral mutation(s) within the areas targeted by this assay, and inadequate number of viral copies(<138 copies/mL). A negative result must be combined with clinical observations, patient history, and epidemiological information. The expected result is Negative.  Fact Sheet for Patients:  EntrepreneurPulse.com.au  Fact Sheet for Healthcare Providers:  IncredibleEmployment.be  This test is no t yet approved or cleared by the [place] FDA and  has been authorized for detection and/or diagnosis of SARS-CoV-2 by FDA under an Emergency Use Authorization (EUA). This EUA will remain  in effect (meaning this test can be used) for the  duration of the COVID-19 declaration under Section 564(b)(1) of the Act, 21 U.S.C.section 360bbb-3(b)(1), unless the authorization is terminated  or revoked sooner.       Influenza A by PCR NEGATIVE NEGATIVE Final   Influenza B by PCR NEGATIVE NEGATIVE Final    Comment: (NOTE) The Xpert Xpress SARS-CoV-2/FLU/RSV plus assay is intended as an aid in the diagnosis of influenza from Nasopharyngeal swab specimens and should not be used as a sole basis for treatment. Nasal washings and aspirates are unacceptable for Xpert Xpress SARS-CoV-2/FLU/RSV testing.  Fact Sheet for Patients: EntrepreneurPulse.com.au  Fact Sheet for Healthcare Providers: IncredibleEmployment.be  This test is not yet approved or cleared by the [place] FDA and has been authorized for detection and/or diagnosis of SARS-CoV-2 by FDA under an Emergency Use Authorization (EUA). This EUA will remain in effect (meaning this test can be used) for the duration of the COVID-19 declaration under Section 564(b)(1) of the Act, 21 U.S.C. section 360bbb-3(b)(1), unless the authorization is terminated or revoked.     Resp Syncytial Virus by PCR NEGATIVE NEGATIVE Final    Comment: (NOTE) Fact Sheet for Patients: EntrepreneurPulse.com.au  Fact Sheet for Healthcare Providers: IncredibleEmployment.be  This test is not yet approved or cleared by the [place] FDA and has been authorized for detection and/or diagnosis of SARS-CoV-2 by FDA under an Emergency Use Authorization (EUA). This EUA will remain in effect (meaning this test can be used) for the duration of the COVID-19  declaration under Section 564(b)(1) of the Act, 21 U.S.C. section 360bbb-3(b)(1), unless the authorization is terminated or revoked.  Performed at [facility], [place] [address], [place], [place] [address]   Culture, blood (Routine X 2) w Reflex to ID Panel     Status:  None   Collection Time: 08/05/22  9:05 PM   Specimen: BLOOD RIGHT ARM  Result Value Ref Range Status   Specimen Description BLOOD RIGHT ARM  Final   Special Requests   Final    BOTTLES DRAWN AEROBIC AND ANAEROBIC Blood Culture adequate volume   Culture   Final    NO GROWTH 5 DAYS Performed at [facility], [place] [address], [place], [place] [address]    Report Status 08/10/2022 FINAL  Final  Culture, blood (Routine X 2) w Reflex to ID Panel     Status: None   Collection Time: 08/05/22  9:05 PM   Specimen: BLOOD RIGHT ARM  Result Value Ref Range Status   Specimen Description BLOOD RIGHT ARM  Final   Special Requests   Final    BOTTLES DRAWN AEROBIC AND ANAEROBIC Blood Culture adequate volume   Culture   Final    NO GROWTH 5 DAYS Performed at [facility], [place] [address] [place], [place] [address]    Report Status 08/10/2022 FINAL  Final  MRSA Next Gen by PCR, Nasal     Status: None   Collection Time: 08/06/22 12:50 AM   Specimen: Nasal Mucosa; Nasal Swab  Result Value Ref Range Status   MRSA by PCR Next Gen NOT DETECTED NOT DETECTED Final    Comment: (NOTE) The GeneXpert MRSA Assay (FDA approved for NASAL specimens only), is one component of a comprehensive MRSA colonization surveillance program. It is not intended to diagnose MRSA infection nor to guide or monitor treatment for MRSA infections. Test performance is not FDA approved in patients less than 92 years old. Performed at [facility], [place] [address], [place], [place] [address]      Labs:   Basic Metabolic Panel: Recent Labs  Lab 08/07/22 0321 08/08/22 0435 08/09/22 0315 08/09/22 1030 08/10/22 0240 08/13/22 0919  NA 136 135 137  --  135 138  K 3.8 4.0 4.5  --  4.5 4.5  CL 96* 95* 97*  --  95* 96*  CO2 '22 24 22  '$ --  21* 20*  GLUCOSE 157* 195* 156*  --  191* 145*  BUN 30* 48* 30*  --  62* 78*  CREATININE 4.37* 6.44* 4.72*  --  6.29* 7.96*  CALCIUM 8.6* 8.4* 8.7*  --  8.7* 8.7*  PHOS   --   --   --  4.9*  --  5.7*   Liver Function Tests: Recent Labs  Lab 08/07/22 0321 08/08/22 1401 08/09/22 0315 08/10/22 0240 08/13/22 0919  AST 451* 113* 48* 30  --   ALT 273* 138* 59* 28  --   ALKPHOS 132* 114 102 111  --   BILITOT 1.2 0.9 1.1 1.5*  --   PROT 6.5 6.4* 6.4* 6.8  --   ALBUMIN 2.4* 2.3* 2.5* 2.6* 2.5*    CBC: Recent Labs  Lab 08/07/22 0321 08/08/22 0435 08/09/22 0315 08/10/22 0240 08/10/22 0858 08/13/22 0919  WBC 9.3 9.5 11.6* 9.7 8.1 13.2*  NEUTROABS 7.8* 7.9* 9.2* 7.4  --   --   HGB 9.8* 8.9* 8.9* 9.6* 8.7* 9.5*  HCT 31.6* 28.8* 28.1* 29.9* 27.4* 31.2*  MCV  94.6 95.0 94.9 93.7 93.5 96.0  PLT 213 176 154 169 184 217     CBG: Recent Labs  Lab 08/12/22 0913 08/12/22 1157 08/12/22 1657 08/13/22 0006 08/13/22 0749  GLUCAP 140* 150* 99 128* 148*     IMAGING STUDIES US Abdomen Limited RUQ (LIVER/GB)  Result Date: 08/06/2022 CLINICAL DATA:  Elevated LFT EXAM: ULTRASOUND ABDOMEN LIMITED RIGHT UPPER QUADRANT COMPARISON:  None Available. FINDINGS: Gallbladder: Small stones. Negative sonographic Murphy. Slight increased wall thickness at 4.7 mm. Common bile duct: Diameter: 6.6 mm Liver: Slightly echogenic liver parenchyma. No focal hepatic abnormality. Portal vein is patent on color Doppler imaging with normal direction of blood flow towards the liver. Other: None. IMPRESSION: 1. Cholelithiasis with gallbladder wall thickening but negative sonographic Murphy. Upper normal common duct diameter. 2. Possible slightly echogenic liver parenchyma suggesting hepatic steatosis. Electronically Signed   By: Donavan Foil M.D.   On: 08/06/2022 22:41   DG Chest Portable 1 View  Result Date: 08/05/2022 CLINICAL DATA:  Weakness.  End-stage renal disease. EXAM: PORTABLE CHEST 1 VIEW COMPARISON:  05/10/2022 FINDINGS: The cardio pericardial silhouette is enlarged. Right IJ central line tip overlies the right atrium. The lungs are clear without focal pneumonia, edema,  pneumothorax or pleural effusion. The visualized bony structures of the thorax are unremarkable. Telemetry leads overlie the chest. IMPRESSION: Enlargement of the cardiopericardial silhouette without acute cardiopulmonary findings. Electronically Signed   By: Misty Stanley M.D.   On: 08/05/2022 13:02    DISCHARGE EXAMINATION: Please review progress note from earlier today.  DISPOSITION: CIR  Discharge Instructions     Negative Pressure Wound Therapy - Incisional   Complete by: As directed         Current Inpatient Medications: Scheduled:  (feeding supplement) PROSource Plus  30 mL Oral BID BM   ALPRAZolam  0.25 mg Oral Daily   vitamin C  1,000 mg Oral Daily   aspirin EC  81 mg Oral Daily   Chlorhexidine Gluconate Cloth  6 each Topical Q0600   clopidogrel  75 mg Oral Daily   feeding supplement (NEPRO CARB STEADY)  237 mL Oral BID BM   gabapentin  100 mg Oral TID   heparin  5,000 Units Subcutaneous Q12H   insulin aspart  0-9 Units Subcutaneous TID WC   linaclotide  72 mcg Oral q AM   linagliptin  5 mg Oral Daily   midodrine  10 mg Oral TID WC   nutrition supplement (JUVEN)  1 packet Oral BID BM   pantoprazole  40 mg Oral Daily   sodium bicarbonate  650 mg Oral BID   sodium chloride flush  3 mL Intravenous Q12H   sucroferric oxyhydroxide  1,000 mg Oral BID WC   zinc sulfate  220 mg Oral Daily   Continuous:  sodium chloride     anticoagulant sodium citrate     QTM:AUQJFH chloride, acetaminophen, alteplase, anticoagulant sodium citrate, bisacodyl, bismuth subsalicylate, camphor-menthol, guaiFENesin-dextromethorphan, heparin, [START ON 08/14/2022] heparin, lidocaine (PF), lidocaine-prilocaine, morphine injection, ondansetron, mouth rinse, oxyCODONE, pentafluoroprop-tetrafluoroeth, phenol, polyethylene glycol, sodium chloride flush     Follow-up Information     [name], MD Follow up in 1 week(s).   Specialty: Orthopedic Surgery Contact information: [address] [place] [address] [phone]                 TOTAL DISCHARGE TIME: 35 mins  [facility] Pager on [email or web address]  08/13/2022, 10:57 AM

## 2022-08-13 NOTE — Progress Notes (Signed)
Report given to [facility] Hewitt,LPN at [place]. All questions and concerns were fully addressed. Pt  d/c to 4W25 post HD tx.HD nurse Hector Brunswick made aware of above transfer, and will give report to 4W after HD

## 2022-08-13 NOTE — Progress Notes (Signed)
Inpatient Rehab Admissions Coordinator:    I have a CIR bed for this pt. Today. RN may call report to [phone].  Nazareth Kirk, MS, CCC-SLP Rehab Admissions Coordinator  [phone] (celll) [phone] (office)  

## 2022-08-13 NOTE — H&P (Signed)
Physical Medicine and Rehabilitation Admission H&P    Chief Complaint  Patient presents with   Nausea   Dizziness    : DDU:KGURK L Baisley 51 year old right-handed male well-known to rehab services right below-knee amputation 06/19/2018 as well as left transmetatarsal amputation 05/10/2016 receiving inpatient rehab services 06/22/2018 - 07/01/2018 and has since undergone revision of right BKA to AKA 01/08/2022, multiple finger amputations,, quit smoking 2 years ago, chronic combined CHF with ejection fraction 45%, chronic orthostatic hypotension, end-stage renal disease with hemodialysis PVD maintained on aspirin and Plavix. Patient reports that he lives with his 2 adult aged sons.  1 level home with one-step to entry.  He has a prosthesis for right leg but started using it yet.  Presented 08/05/2022 with gangrenous changes of the left lower extremity, bradycardia and hypotension.  He was treated for hyperkalemia with K+ of 7. He failed conservative measures and underwent elective left AKA 08/07/2022 per Dr. Sharol Given.  Wound VAC applied.  Hospital course hemodialysis ongoing as per renal services.  Acute on chronic anemia 9.5.  He was cleared to begin subcutaneous heparin for DVT prophylaxis.  Therapy evaluations completed due to patient decreased functional mobility was admitted for a comprehensive rehab program.  Review of Systems  Constitutional:  Negative for chills and fever.  HENT:  Negative for hearing loss.   Eyes:  Negative for blurred vision and double vision.  Respiratory:  Negative for cough and wheezing.        Shortness of breath with exertion  Cardiovascular:  Positive for leg swelling. Negative for chest pain and palpitations.  Gastrointestinal:  Positive for constipation and nausea. Negative for abdominal pain.       GERD  Skin:  Negative for rash.  Neurological:  Positive for dizziness and weakness. Negative for headaches.  Psychiatric/Behavioral:  Positive for depression.         Anxiety  All other systems reviewed and are negative.  Past Medical History:  Diagnosis Date   Allergy    Anemia    getting Venofer with dialysis   Anxiety    self reported, sometimes-situational anxiety   Arthritis    bilateral hands/LEFT hip   CHF (congestive heart failure) (HCC)    Chronic kidney disease    t th s dialysis- [place] ---ESRD   Coronary artery disease    Depression    self reported, sometimes-situational depression   Diabetes mellitus    type II- on meds   Dyspnea    sometimes- fluid   GERD (gastroesophageal reflux disease)    on meds   History of blood transfusion    Hypertension    no longer on medication since starting on Hemodialysis   Myocardial infarction ([place]) 2019   Peripheral vascular disease ([place])    Sleep apnea    does not use CPAP   Past Surgical History:  Procedure Laterality Date   A/V FISTULAGRAM Left 03/06/2020   Procedure: A/V FISTULAGRAM;  Surgeon: Waynetta Sandy, MD;  Location: [facility];  Service: Cardiovascular;  Laterality: Left;   ABDOMINAL AORTOGRAM N/A 01/07/2022   Procedure: ABDOMINAL AORTOGRAM;  Surgeon: Waynetta Sandy, MD;  Location: [facility];  Service: Cardiovascular;  Laterality: N/A;   ABDOMINAL AORTOGRAM W/LOWER EXTREMITY N/A 04/09/2021   Procedure: ABDOMINAL AORTOGRAM W/LOWER EXTREMITY;  Surgeon: Waynetta Sandy, MD;  Location: [facility];  Service: Cardiovascular;  Laterality: N/A;   AMPUTATION Left 05/10/2016   Procedure: Left Transmetatarsal Amputation;  Surgeon: Beverely Low  Fernanda Drum, MD;  Location: [facility];  Service: Orthopedics;  Laterality: Left;   AMPUTATION Right 05/16/2018   Procedure: PARTIAL RAY AMPUTATION FIFTH TOE RIGHT FOOT;  Surgeon: [name], DPM;  Location: [facility];  Service: Podiatry;  Laterality: Right;   AMPUTATION Right 06/17/2018   Procedure: AMPUTATION 4th RAY Right Foot;  Surgeon: Trula Slade, DPM;  Location: [facility];  Service: Podiatry;   Laterality: Right;   AMPUTATION Right 06/19/2018   Procedure: RIGHT BELOW KNEE AMPUTATION;  Surgeon: [name], MD;  Location: [facility];  Service: Orthopedics;  Laterality: Right;   AMPUTATION Left 10/05/2021   Procedure: LEFT RING FINGER AMPUTATION;  Surgeon: Daryll Brod, MD;  Location: [facility];  Service: Orthopedics;  Laterality: Left;  45 MIN   AMPUTATION Left 11/15/2021   Procedure: LEFT INDEX FINGER AMPUTATION;  Surgeon: Leanora Cover, MD;  Location: [facility];  Service: Orthopedics;  Laterality: Left;  60 MIN   AMPUTATION Right 01/08/2022   Procedure: AMPUTATION ABOVE KNEE;  Surgeon: Waynetta Sandy, MD;  Location: [facility];  Service: Vascular;  Laterality: Right;   AMPUTATION Bilateral 01/12/2022   Procedure: LEFT INDEX FINGER AMPUTATION, RIGHT INDEX, LONG AND SMALL DIGITS AMPUTATION;  Surgeon: Leanora Cover, MD;  Location: [facility];  Service: Orthopedics;  Laterality: Bilateral;   AMPUTATION Right 04/01/2022   Procedure: RIGHT INDEX FINGER AMPUTATION AT METACARPAL PHALANGEAL JOINT, INCISION AND DRAINAGE RIGHT HAND;  Surgeon: Leanora Cover, MD;  Location: [facility];  Service: Orthopedics;  Laterality: Right;  90 MIN   AMPUTATION Right 05/23/2022   Procedure: RIGHT INDEX FINGER RAY AMPUTATION; RIGHT LONG, RING, AND SMALL FINGER AMPUTATIONS;  Surgeon: Leanora Cover, MD;  Location: [facility];  Service: Orthopedics;  Laterality: Right;  90 MIN   AMPUTATION Left 08/07/2022   Procedure: LEFT ABOVE KNEE AMPUTATION;  Surgeon: [name], MD;  Location: [facility];  Service: Orthopedics;  Laterality: Left;   AORTIC ARCH ANGIOGRAPHY N/A 01/07/2022   Procedure: AORTIC ARCH ANGIOGRAPHY;  Surgeon: Waynetta Sandy, MD;  Location: [facility];  Service: Cardiovascular;  Laterality: N/A;   APPLICATION OF WOUND VAC Right 06/19/2018   Procedure: APPLICATION OF WOUND VAC;  Surgeon: [name], MD;  Location: [facility];  Service: Orthopedics;  Laterality: Right;   APPLICATION OF WOUND VAC Left 08/07/2022    Procedure: APPLICATION OF WOUND VAC;  Surgeon: [name], MD;  Location: [facility];  Service: Orthopedics;  Laterality: Left;   AV FISTULA PLACEMENT Left 11/25/2019   Procedure: LEFT ARM Brachiocephalic  ARTERIOVENOUS (AV) FISTULA CREATION;  Surgeon: Rosetta Posner, MD;  Location: [facility];  Service: Vascular;  Laterality: Left;   [place] Left 07/19/2020   Procedure: LEFT SECOND STAGE [place];  Surgeon: Waynetta Sandy, MD;  Location: [facility];  Service: Vascular;  Laterality: Left;   CIRCUMCISION  09/02/2011   Procedure: CIRCUMCISION ADULT;  Surgeon: Molli Hazard, MD;  Location: [facility];  Service: Urology;  Laterality: N/A;   CORONARY STENT INTERVENTION N/A 07/29/2018   Procedure: CORONARY STENT INTERVENTION;  Surgeon: Leonie Man, MD;  Location: [facility];  Service: Cardiovascular;  Laterality: N/A;   DEBRIDEMENT AND CLOSURE WOUND Left 11/15/2021   Procedure: LEFT HAND WOUND CLOSURE;  Surgeon: Leanora Cover, MD;  Location: [facility];  Service: Orthopedics;  Laterality: Left;   I & D EXTREMITY  09/02/2011   Procedure: IRRIGATION AND DEBRIDEMENT EXTREMITY;  Surgeon: Theodoro Kos, DO;  Location: [facility];  Service: Plastics;  Laterality: N/A;   INCISION AND DRAINAGE OF WOUND  08/12/2011   Procedure: IRRIGATION AND DEBRIDEMENT WOUND;  Surgeon: Zenovia Jarred, MD;  Location: [facility];  Service: General;;  perineum   INSERTION OF DIALYSIS CATHETER N/A 11/25/2019   Procedure: INSERTION OF Righ Internal Jugular DIALYSIS CATHETER;  Surgeon: Rosetta Posner, MD;  Location: [facility];  Service: Vascular;  Laterality: N/A;   [place]  08/12/2011   Procedure: IRRIGATION AND DEBRIDEMENT ABSCESS;  Surgeon: Molli Hazard, MD;  Location: [facility];  Service: Urology;  Laterality: N/A;  irrigation and debridment perineum     IRRIGATION AND DEBRIDEMENT ABSCESS  08/14/2011   Procedure: MINOR  INCISION AND DRAINAGE OF ABSCESS;  Surgeon: Molli Hazard, MD;  Location: [facility];  Service: Urology;  Laterality: N/A;  Perineal Wound Debridement;Placement Bilateral Testicular Thigh Pouches   LEFT HEART CATH AND CORONARY ANGIOGRAPHY N/A 07/29/2018   Procedure: LEFT HEART CATH AND CORONARY ANGIOGRAPHY;  Surgeon: Leonie Man, MD;  Location: [facility];  Service: Cardiovascular;  Laterality: N/A;   LOWER EXTREMITY ANGIOGRAPHY Bilateral 01/07/2022   Procedure: Lower Extremity Angiography;  Surgeon: Waynetta Sandy, MD;  Location: [facility];  Service: Cardiovascular;  Laterality: Bilateral;   PERIPHERAL VASCULAR ATHERECTOMY Left 04/09/2021   Procedure: PERIPHERAL VASCULAR ATHERECTOMY;  Surgeon: Waynetta Sandy, MD;  Location: [facility];  Service: Cardiovascular;  Laterality: Left;  TP trunk and peroneal arteries   PERIPHERAL VASCULAR BALLOON ANGIOPLASTY Left 04/09/2021   Procedure: PERIPHERAL VASCULAR BALLOON ANGIOPLASTY;  Surgeon: Waynetta Sandy, MD;  Location: [facility];  Service: Cardiovascular;  Laterality: Left;  Popliteal   REVISON OF ARTERIOVENOUS FISTULA Left 05/24/2020   Procedure: LEFT ARM ARTERIOVENOUS FISTULA  CONVERSION TO BASILIC VEIN FISTULA;  Surgeon: Waynetta Sandy, MD;  Location: [facility];  Service: Vascular;  Laterality: Left;   TOTAL HIP ARTHROPLASTY Left 10/11/2016   Procedure: LEFT TOTAL HIP ARTHROPLASTY ANTERIOR APPROACH;  Surgeon: Mcarthur Rossetti, MD;  Location[facility];  Service: Orthopedics;  Laterality: Left;   UPPER EXTREMITY ANGIOGRAPHY Bilateral 01/07/2022   Procedure: Upper Extremity Angiography;  Surgeon: Waynetta Sandy, MD;  Location: [facility];  Service: Cardiovascular;  Laterality: Bilateral;   Family History  Problem Relation Age of Onset   Diabetes Other    Pancreatic cancer Mother 78   Colon cancer Neg Hx    Colon polyps Neg Hx    Esophageal cancer  Neg Hx    Rectal cancer Neg Hx    Stomach cancer Neg Hx    Social History:  reports that he quit smoking about 2 years ago. His smoking use included cigarettes. He has a 6.00 pack-year smoking history. He has never used smokeless tobacco. He reports that he does not currently use alcohol. He reports that he does not use drugs. Allergies:  Allergies  Allergen Reactions   Crestor [Rosuvastatin] Rash    Eye redness   Dilaudid [Hydromorphone] Itching   Robaxin [Methocarbamol] Other (See Comments)    Pt states medication made his arm pain worse.   Benadryl [Diphenhydramine] Rash   Medications Prior to Admission  Medication Sig Dispense Refill   acetaminophen (TYLENOL) 500 MG tablet Take 1,000 mg by mouth Every Tuesday,Thursday,and Saturday with dialysis.     allopurinol (ZYLOPRIM) 100 MG tablet Take 100 mg by mouth daily as needed (for gout).     ALPRAZolam (XANAX) 0.25 MG tablet  Take 0.25 mg by mouth daily.     aspirin EC 81 MG tablet Take 81 mg by mouth daily. Swallow whole.     atorvastatin (LIPITOR) 80 MG tablet TAKE ONE TABLET BY MOUTH ONCE DAILY 90 tablet 3   bismuth subsalicylate (PEPTO BISMOL) 262 MG/15ML suspension Take 30 mLs by mouth every 6 (six) hours as needed for indigestion.     clopidogrel (PLAVIX) 75 MG tablet TAKE ONE TABLET BY MOUTH ONCE DAILY (Patient taking differently: Take 75 mg by mouth daily.) 30 tablet 11   ezetimibe (ZETIA) 10 MG tablet Take 1 tablet (10 mg total) by mouth daily. (Patient not taking: Reported on 05/11/2022) 30 tablet 3   gabapentin (NEURONTIN) 100 MG capsule Take 100 mg by mouth 3 (three) times daily.     HUMALOG MIX 75/25 KWIKPEN (75-25) 100 UNIT/ML KwikPen Inject 30 Units into the skin daily.     insulin aspart protamine - aspart (NOVOLOG MIX 70/30 FLEXPEN) (70-30) 100 UNIT/ML FlexPen Inject 0.3 mLs (30 Units total) into the skin 2 (two) times daily with a meal. (Patient not taking: Reported on 08/08/2022) 15 mL 0   Insulin Pen Needle 31G X 5 MM  MISC Use daily to inject insulin as instructed 100 each 2   linaclotide (LINZESS) 72 MCG capsule Take 1 capsule (72 mcg total) by mouth daily before breakfast. (Patient taking differently: Take 72 mcg by mouth in the morning.) 8 capsule 0   oxyCODONE (ROXICODONE) 5 MG immediate release tablet 1-2 tabs PO q6 hours prn pain (Patient taking differently: Take 5 mg by mouth every 6 (six) hours as needed for moderate pain.) 20 tablet 0   pantoprazole (PROTONIX) 40 MG tablet Take 1 tablet (40 mg total) by mouth daily. 180 tablet 0   sulfamethoxazole-trimethoprim (BACTRIM DS) 800-160 MG tablet Take 1 tablet by mouth 2 (two) times daily. (Patient not taking: Reported on 08/08/2022)     VELPHORO 500 MG chewable tablet Chew 1,000 mg by mouth 2 (two) times daily.       Home: Home Living Family/patient expects to be discharged to:: Private residence Living Arrangements: Children (2 adult sons) Available Help at Discharge: Family, Available 24 hours/day Type of Home: House Home Access: Stairs to enter [employer] of Steps: 1 to get onto porch and 1 to get into house Entrance Stairs-Rails: Right, Left Home Layout: One level Bathroom Shower/Tub: Multimedia programmer: Handicapped height Bathroom Accessibility: Yes Home Equipment: Conservation officer, nature (2 wheels), Wheelchair - manual, Shower seat, BSC/3in1, Cane - single point, Hand held shower head, Tub bench Additional Comments: Has prosthetic for R leg but has not started rehab with it yet.   Functional History: Prior Function Prior Level of Function : Needs assist Physical Assist : Mobility (physical), ADLs (physical) Mobility (physical): Transfers ADLs (physical): Dressing, Bathing, Toileting, Grooming Mobility Comments: Primarily does lateral scoots for transfers  without sliding board; reports normally could scoot on his own but as he has gotten sicker his sons have assisted ADLs Comments: Reports sons assisted with ADLs (pt does  chair push up and sons pull up pants). Took showers using tub bench.   Functional Status:  Mobility: Bed Mobility Overal bed mobility: Needs Assistance Bed Mobility: Rolling Rolling: Mod assist Supine to sit: Mod assist, +2 for physical assistance Sit to supine: Min assist General bed mobility comments: increased time; cues to not push with R hand; multiple rolls during session for strengthening and bed straightening Transfers General transfer comment: Pt unable to tolerate today due  to pain (received Neurotin prior to eval) also limited by not being able to weight bear in R hand.  Would require at least mod x 2 for anterior/posterior transfers Ambulation/Gait General Gait Details: defer   ADL: ADL Overall ADL's : Needs assistance/impaired Eating/Feeding: Minimal assistance, Bed level Grooming: Wash/dry hands, Wash/dry face, Oral care, Minimal assistance, Sitting, Bed level Upper Body Bathing: Moderate assistance, Bed level, Sitting Lower Body Bathing: Total assistance, Bed level Upper Body Dressing : Moderate assistance, Sitting, Bed level Lower Body Dressing: Total assistance, Bed level Toilet Transfer: +2 for physical assistance, Total assistance, Anterior/posterior, BSC/3in1 Toilet Transfer Details (indicate cue type and reason): based on clinical judgement Toileting- Clothing Manipulation and Hygiene: Total assistance, Bed level   Cognition: Cognition Overall Cognitive Status: Within Functional Limits for tasks assessed Orientation Level: Oriented X4 Cognition Arousal/Alertness: Awake/alert Behavior During Therapy: WFL for tasks assessed/performed Overall Cognitive Status: Within Functional Limits for tasks assessed    Physical Exam: Blood pressure 94/72, pulse 93, temperature 98 F (36.7 C), resp. rate 16, height 3' 11.24" (1.2 m), weight 73.7 kg, SpO2 96 %.   General: No apparent distress, chronically ill-appearing HEENT: Head is normocephalic, atraumatic, PERRLA,  EOMI, sclera anicteric, oral mucosa pink and moist, dentition intact, ext ear canals clear,  Neck: Supple without JVD or lymphadenopathy Heart: Reg rate and rhythm. No murmurs rubs or gallops Chest: CTA bilaterally without wheezes, rales, or rhonchi; no distress Abdomen: Soft, non-tender, non-distended, bowel sounds positive. Extremities: No clubbing, cyanosis, or edema. Pulses are 2+ Psych: Pt's affect is appropriate. Pt is cooperative Skin: Clean and intact without signs of breakdown Neuro: Alert and oriented to person and place.  Knows December but does not know year or day.  Follows commands.  Cranial nerves II through XII intact. Sensation to light touch intact in all 4 extremities Strength 5 out of 5 bilateral shoulder abduction, elbow flexion and extension Able to lift bilateral lower extremities Musculoskeletal: Patient with multiple finger amputations.  He has amputations of right second, third and fifth digits at MCP and fourth digit PIP.  He has amputations of the left second and fourth digits.  Left third and fifth digits with flexion contractures.  Right AKA is well-healed.  Left AKA with wound VAC dressing in place draining serosanguineous fluid.  Appropriately tender  No joint swelling noted  Right IJ TDC Right upper extremity IV     Results for orders placed or performed during the hospital encounter of 08/13/22 (from the past 48 hour(s))  Glucose, capillary     Status: Abnormal   Collection Time: 08/13/22  6:48 PM  Result Value Ref Range   Glucose-Capillary 123 (H) 70 - 99 mg/dL    Comment: Glucose reference range applies only to samples taken after fasting for at least 8 hours.   No results found.    Blood pressure 94/72, pulse 93, temperature 98 F (36.7 C), resp. rate 16, height 3' 11.24" (1.2 m), weight 73.7 kg, SpO2 96 %.  Medical Problem List and Plan: 1. Functional deficits secondary to left AKA 08/05/2022 due to gangrenous changes.  Status post wound VAC.   History of right TKA 01/08/2022  -patient may not shower  -ELOS/Goals: 12-14 days , PT/OT min assist   -Admit to CIR 2.  Antithrombotics: -DVT/anticoagulation:  Pharmaceutical: Heparin  -antiplatelet therapy: Aspirin 81 mg daily and Plavix 75 mg daily 3. Pain Management: Neurontin 100 mg 3 times daily, oxycodone as needed 4. Mood/Behavior/Sleep: Xanax 0.25 mg daily.  Provide emotional support  -  antipsychotic agents: N/A 5. Neuropsych/cognition: This patient is capable of making decisions on his own behalf. 6. Skin/Wound Care: Routine skin checks 7. Fluids/Electrolytes/Nutrition: Routine in and outs with follow-up chemistries 8.  End-stage renal disease.  Hemodialysis as directed per Nephrology. TTS schedule 9.  Acute on chronic anemia. Last HGB 9.5.  Follow-up CBC 10.  Diabetes mellitus.  Hemoglobin A1c 6.3.  Tradjenta 5 mg daily/SSI. CBGs well controlled 11.  Chronic orthostasis.  ProAmatine 10 mg 3 times daily.  Monitor with increased mobility. Monitor fluid intake. 12.  Constipation.  Linzess daily and MiraLAX as needed. LBM 12/26 13.  GERD.  Protonix 14.  Chronic combined CHF.  Ejection fraction 45%.  Monitor for any signs of fluid overload. Daily weights 15. Hyperkalemia. Resolved. Continue HD per nephrology. 16. Acute transaminitis and cholelithiasis. monitor LFTs.   Lavon Paganini Angiulli, PA-C 08/13/2022  I have personally performed a face to face diagnostic evaluation of this patient and formulated the key components of the plan.  Additionally, I have personally reviewed laboratory data, imaging studies, as well as relevant notes and concur with the physician assistant's documentation above.  The patient's status has not changed from the original H&P.  Any changes in documentation from the acute care chart have been noted above.  Jennye Boroughs, MD, [facility]

## 2022-08-13 NOTE — Progress Notes (Signed)
Inpatient Rehabilitation Admission Medication Review by a Pharmacist  A complete drug regimen review was completed for this patient to identify any potential clinically significant medication issues.  High Risk Drug Classes Is patient taking? Indication by Medication  Antipsychotic No   Anticoagulant Yes Heparin sq for VTE px  Antibiotic No   Opioid Yes Oxycodone for pain  Antiplatelet Yes ASA and plavix for CAD  Hypoglycemics/insulin Yes Linagliptin and novolog SSI for hyperglycemia  Vasoactive Medication Yes Midodrine for hypotension  Chemotherapy No   Other Yes Vitamin C-wound care Zinc-wound care Alprazolam-anxiety Gabapentin-pain Linaclotide-constipation Pantoprazole-GERD Sodium bicarbonate-acidosis Velphoro-phos binding Miralax-constipation     Type of Medication Issue Identified Description of Issue Recommendation(s)  Drug Interaction(s) (clinically significant)     Duplicate Therapy     Allergy     No Medication Administration End Date     Incorrect Dose     Additional Drug Therapy Needed     Significant med changes from prior encounter (inform family/care partners about these prior to discharge). Patient on atorvastatin PTA Would recommend restart of atorvastatin  Other       Clinically significant medication issues were identified that warrant physician communication and completion of prescribed/recommended actions by midnight of the next day:  No  Name of provider notified for urgent issues identified:   Provider Method of Notification:     Pharmacist comments:   Time spent performing this drug regimen review (minutes):  20   Dwayne A. Levada Dy, PharmD, BCPS, FNKF Clinical Pharmacist [place] Please utilize Amion for appropriate phone number to reach the unit pharmacist ([place])  08/13/2022 12:28 PM

## 2022-08-13 NOTE — Progress Notes (Signed)
 PMR Admission Coordinator Pre-Admission Assessment   Patient: Edward [name] is an 51 y.o., male MRN: [id number] DOB: 07/09/1971 Height: 6' 1" (185.4 cm) Weight: 82 kg   Insurance Information HMO:  yes   PPO:      PCP:      IPA:      80/20:      OTHER: Group [id number] PRIMARY: UHC medicare      Policy#: [id number]      Subscriber: self CM Name:       Phone#: [phone]     Fax#: [phone] Pre-Cert#: [id number]      Employer: Disabled, not employed.. [employer] called with auth on 08/13/22 for admit 12/26-23 through 08/20/2022 Benefits:  Phone #: [phone]     Name:  Eff Date: 01/17/2022 - 08/18/2022 Deductible: $0 (does not have deductible) OOP Max: $3,600 ($3,600 met)  CIR: $295/day co-pay for days 1-5, $0/day co-pay for days 6+  SNF: $0.00 Copayment per day for days 1-20; $196.00 Copayment per day for days 21-39; $0.00 Copayment per day for days 40-100 for Medicare-covered care/maximum 100 days/benefit period  Outpatient: $10/visit co-pay  Home Health:  100% coverage; limited by medical necessity  DME: 80% coverage; 20% co-insurance  Providers: in network  SECONDARY: Medicaid [place]      Policy#: [id number]     Phone#: [phone]   Financial Counselor:       Phone#:    The "Data Collection Information Summary" for patients in Inpatient Rehabilitation Facilities with attached "Privacy Act Statement-Health Care Records" was provided and verbally reviewed with: Patient   Emergency Contact Information Contact Information       Name Relation Home Work Mobile    Peel,Jamal Son     [phone]    Mortensen,[name] Son [phone]   [phone]           Current Medical History  Patient Admitting Diagnosis: New L AKA, old R AKA   History of Present Illness::Edward [name] 51-year-old right-handed male well-known to rehab services right below-knee amputation 06/19/2018 as well as left transmetatarsal amputation 05/10/2016 receiving inpatient rehab services 06/22/2018 -  07/01/2018 and has since undergone revision of right BKA to AKA 01/08/2022, multiple finger amputations,, quit smoking 2 years ago, chronic combined CHF with ejection fraction 45%, chronic orthostatic hypotension, end-stage renal disease with hemodialysis PVD maintained on aspirin and Plavix.  Per chart review patient lives with his 2 adult aged sons.  1 level home with one-step to entry.  He has a prosthesis for right leg but started using it yet.  Presented 08/05/2022 with gangrenous changes of the left lower extremity who failed conservative measures and underwent elective left AKA 08/07/2022 per Dr. Duda.  Wound VAC applied.  Hospital course hemodialysis ongoing as per renal services.  Acute on chronic anemia 9.5.  He was cleared to begin subcutaneous heparin for DVT prophylaxis.  Therapy evaluations completed due to patient decreased functional mobility was admitted for a comprehensive rehab program.     Patient's medical record from [facility] has been reviewed by the rehabilitation admission coordinator and physician.   Past Medical History      Past Medical History:  Diagnosis Date   Allergy     Anemia      getting Venofer with dialysis   Anxiety      self reported, sometimes-situational anxiety   Arthritis      bilateral hands/LEFT hip   CHF (congestive heart failure) (HCC)     Chronic kidney disease        t [facility] s dialysis- [place] ---[facility]   Coronary artery disease     Depression      self reported, sometimes-situational depression   Diabetes mellitus      type II- on meds   Dyspnea      sometimes- fluid   GERD (gastroesophageal reflux disease)      on meds   History of blood transfusion     Hypertension      no longer on medication since starting on Hemodialysis   Myocardial infarction (HCC) 2019   Peripheral vascular disease (HCC)     Sleep apnea      does not use CPAP      Has the patient had major surgery during 100 days prior to admission? Yes   Family History    family history includes Diabetes in an other family member; Pancreatic cancer (age of onset: 78) in his mother.   Current Medications   Current Facility-Administered Medications:    (feeding supplement) PROSource Plus liquid 30 mL, 30 mL, Oral, BID BM, Brown, Rita Harbison, NP, 30 mL at 08/13/22 0832   0.9 %  sodium chloride infusion, 250 mL, Intravenous, PRN, Duda, Marcus V, MD   acetaminophen (TYLENOL) tablet 325-650 mg, 325-650 mg, Oral, Q6H PRN, Duda, Marcus V, MD, 650 mg at 08/11/22 2205   ALPRAZolam (XANAX) tablet 0.25 mg, 0.25 mg, Oral, Daily, Duda, Marcus V, MD, 0.25 mg at 08/13/22 0834   alteplase (CATHFLO ACTIVASE) injection 2 mg, 2 mg, Intracatheter, Once PRN, Brown, Rita Harbison, NP   anticoagulant sodium citrate solution 5 mL, 5 mL, Intracatheter, PRN, Brown, Rita Harbison, NP   ascorbic acid (VITAMIN C) tablet 1,000 mg, 1,000 mg, Oral, Daily, Duda, Marcus V, MD, 1,000 mg at 08/13/22 0834   aspirin EC tablet 81 mg, 81 mg, Oral, Daily, Duda, Marcus V, MD, 81 mg at 08/13/22 0832   bisacodyl (DULCOLAX) EC tablet 5 mg, 5 mg, Oral, Daily PRN, Duda, Marcus V, MD   bismuth subsalicylate (PEPTO BISMOL) 262 MG/15ML suspension 30 mL, 30 mL, Oral, Q6H PRN, Duda, Marcus V, MD   camphor-menthol (SARNA) lotion, , Topical, PRN, Duda, Marcus V, MD, Given at 08/06/22 0010   Chlorhexidine Gluconate Cloth 2 % PADS 6 each, 6 each, Topical, Q0600, Schertz, Robert, MD, 6 each at 08/13/22 0408   clopidogrel (PLAVIX) tablet 75 mg, 75 mg, Oral, Daily, Duda, Marcus V, MD, 75 mg at 08/13/22 0831   feeding supplement (NEPRO CARB STEADY) liquid 237 mL, 237 mL, Oral, BID BM, Brown, Rita Harbison, NP, 237 mL at 08/12/22 1057   gabapentin (NEURONTIN) capsule 100 mg, 100 mg, Oral, TID, Duda, Marcus V, MD, 100 mg at 08/13/22 0833   guaiFENesin-dextromethorphan (ROBITUSSIN DM) 100-10 MG/5ML syrup 15 mL, 15 mL, Oral, Q4H PRN, Duda, Marcus V, MD   heparin injection 1,000 Units, 1,000 Units, Intracatheter, PRN,  Brown, Rita Harbison, NP   [START ON 08/14/2022] heparin injection 3,000 Units, 3,000 Units, Dialysis, PRN, Schertz, Robert, MD   heparin injection 5,000 Units, 5,000 Units, Subcutaneous, Q12H, Duda, Marcus V, MD, 5,000 Units at 08/13/22 0930   insulin aspart (novoLOG) injection 0-9 Units, 0-9 Units, Subcutaneous, TID WC, Duda, Marcus V, MD, 1 Units at 08/13/22 0837   lidocaine (PF) (XYLOCAINE) 1 % injection 5 mL, 5 mL, Intradermal, PRN, Brown, Rita Harbison, NP   lidocaine-prilocaine (EMLA) cream 1 Application, 1 Application, Topical, PRN, Brown, Rita Harbison, NP   linaclotide (LINZESS) capsule 72 mcg, 72 mcg, Oral, q AM, Duda, Marcus V, MD, 72   mcg at 08/11/22 0611   linagliptin (TRADJENTA) tablet 5 mg, 5 mg, Oral, Daily, Duda, Marcus V, MD, 5 mg at 08/13/22 0833   midodrine (PROAMATINE) tablet 10 mg, 10 mg, Oral, TID WC, Brown, Rita Harbison, NP, 10 mg at 08/13/22 0831   morphine (PF) 2 MG/ML injection 2 mg, 2 mg, Intravenous, Q3H PRN, Duda, Marcus V, MD, 2 mg at 08/12/22 1544   nutrition supplement (JUVEN) (JUVEN) powder packet 1 packet, 1 packet, Oral, BID BM, Duda, Marcus V, MD, 1 packet at 08/13/22 0910   ondansetron (ZOFRAN) injection 4 mg, 4 mg, Intravenous, Q6H PRN, Duda, Marcus V, MD, 4 mg at 08/12/22 1118   Oral care mouth rinse, 15 mL, Mouth Rinse, PRN, Duda, Marcus V, MD   oxyCODONE (Oxy IR/ROXICODONE) immediate release tablet 5 mg, 5 mg, Oral, Q4H PRN, Krishnan, Gokul, MD, 5 mg at 08/13/22 0420   pantoprazole (PROTONIX) EC tablet 40 mg, 40 mg, Oral, Daily, Duda, Marcus V, MD, 40 mg at 08/13/22 0833   pentafluoroprop-tetrafluoroeth (GEBAUERS) aerosol 1 Application, 1 Application, Topical, PRN, Brown, Rita Harbison, NP   phenol (CHLORASEPTIC) mouth spray 1 spray, 1 spray, Mouth/Throat, PRN, Duda, Marcus V, MD   polyethylene glycol (MIRALAX / GLYCOLAX) packet 17 g, 17 g, Oral, Daily PRN, Duda, Marcus V, MD   sodium bicarbonate tablet 650 mg, 650 mg, Oral, BID, Duda, Marcus V, MD, 650 mg  at 08/13/22 0833   sodium chloride flush (NS) 0.9 % injection 3 mL, 3 mL, Intravenous, Q12H, Duda, Marcus V, MD, 3 mL at 08/13/22 0834   sodium chloride flush (NS) 0.9 % injection 3 mL, 3 mL, Intravenous, PRN, Duda, Marcus V, MD, 3 mL at 08/09/22 1056   sucroferric oxyhydroxide (VELPHORO) chewable tablet 1,000 mg, 1,000 mg, Oral, BID WC, Duda, Marcus V, MD, 1,000 mg at 08/13/22 0831   zinc sulfate capsule 220 mg, 220 mg, Oral, Daily, Duda, Marcus V, MD, 220 mg at 08/13/22 0832   Patients Current Diet:  Diet Order                  Diet renal with fluid restriction Fluid restriction: 1200 mL Fluid; Room service appropriate? Yes; Fluid consistency: Thin  Diet effective now                         Precautions / Restrictions Precautions Precautions: Fall Precaution Comments: previous R AKA Restrictions Weight Bearing Restrictions: Yes RUE Weight Bearing: Weight bear through elbow only (Pt reports NWB right hand due to recent finger amputation) RLE Weight Bearing: Non weight bearing LLE Weight Bearing: Non weight bearing Other Position/Activity Restrictions: Pt self reported NWB R hand -does have wound with packing R hand    Has the patient had 2 or more falls or a fall with injury in the past year? No   Prior Activity Level Limited Community (1-2x/wk): Went to HD [facility] and went to MD appointments.   Prior Functional Level Self Care: Did the patient need help bathing, dressing, using the toilet or eating? Needed some help   Indoor Mobility: Did the patient need assistance with walking from room to room (with or without device)? Independent   Stairs: Did the patient need assistance with internal or external stairs (with or without device)? Needed some help   Functional Cognition: Did the patient need help planning regular tasks such as shopping or remembering to take medications? Needed some help   Patient Information Are you of Hispanic, Latino/a,or Spanish origin?:   A. No, not  of Hispanic, Latino/a, or Spanish origin What is your race?: B. Black or African American Do you need or want an interpreter to communicate with a doctor or health care staff?: 0. No   Patient's Response To:  Health Literacy and Transportation Is the patient able to respond to health literacy and transportation needs?: Yes Health Literacy - How often do you need to have someone help you when you read instructions, pamphlets, or other written material from your doctor or pharmacy?: Often In the past 12 months, has lack of transportation kept you from medical appointments or from getting medications?: No In the past 12 months, has lack of transportation kept you from meetings, work, or from getting things needed for daily living?: No   Development worker, international aid / [place] Devices/Equipment: Prosthesis, Wheelchair Home Equipment: Conservation officer, nature (2 wheels), Wheelchair - manual, Shower seat, BSC/3in1, Radio producer - single point, Hand held shower head, Tub bench   Prior Device Use: Indicate devices/aids used by the patient prior to current illness, exacerbation or injury? Manual wheelchair, Walker, and Orthotics/Prosthetics   Current Functional Level Cognition   Overall Cognitive Status: Within Functional Limits for tasks assessed Orientation Level: Oriented X4    Extremity Assessment (includes Sensation/Coordination)   Upper Extremity Assessment: LUE deficits/detail, RUE deficits/detail RUE Deficits / Details: AV fistula. 2nd, 3rd, and 5th digit amputation. Pt reports NWB on right hand due packed wound. A/ROM WFL in shoulder, elbow, and wrist ranges. Shoulder flexion: 4-/5, abduction 4/5, IR/er: 4/5 Impaired functional grasp. RUE Coordination: decreased fine motor, decreased gross motor LUE Deficits / Details: 2nd, 4th, and 5th digit amputation. A/ROM WFL in shoulder, elbow, and wrist ranges. shoulder flexion: 4-/5, abduction: 3+/5, IR/er: 4-/5. Impaired functional gross grasp. LUE  Coordination: decreased fine motor, decreased gross motor  Lower Extremity Assessment: Defer to PT evaluation RLE Deficits / Details: Prior AKA; good ROM and at least 3/5 strength LLE Deficits / Details: New AKA; painful; ROM : no obvious contractures, limited by pain but has neutral hip ext; MMT: 1/5     ADLs   Overall ADL's : Needs assistance/impaired Eating/Feeding: Minimal assistance, Bed level Grooming: Wash/dry hands, Wash/dry face, Oral care, Minimal assistance, Sitting, Bed level Upper Body Bathing: Moderate assistance, Bed level, Sitting Lower Body Bathing: Total assistance, Bed level Upper Body Dressing : Moderate assistance, Sitting, Bed level Lower Body Dressing: Total assistance, Bed level Toilet Transfer: +2 for physical assistance, Total assistance, Anterior/posterior, BSC/3in1 Toilet Transfer Details (indicate cue type and reason): based on clinical judgement Toileting- Clothing Manipulation and Hygiene: Total assistance, Bed level     Mobility   Overal bed mobility: Needs Assistance Bed Mobility: Rolling Rolling: Mod assist Supine to sit: Mod assist, +2 for physical assistance Sit to supine: Min assist General bed mobility comments: increased time; cues to not push with R hand; multiple rolls during session for strengthening and bed straightening     Transfers   General transfer comment: Pt unable to tolerate today due to pain (received Neurotin prior to eval) also limited by not being able to weight bear in R hand.  Would require at least mod x 2 for anterior/posterior transfers     Ambulation / Gait / Stairs / Wheelchair Mobility   Ambulation/Gait General Gait Details: defer     Posture / Balance Dynamic Sitting Balance Sitting balance - Comments: prefers use of UE but could balance without support Balance Overall balance assessment: Needs assistance Sitting-balance support: Bilateral upper extremity supported, No upper extremity  supported Sitting balance-Leahy  Scale: Fair Sitting balance - Comments: prefers use of UE but could balance without support Standing balance comment: defer - bil AKA     Special needs/care consideration Dialysis: Hemodialysis Tuesday, Thursday, and Saturday, Wound Vac Yes L AKA post op incision area, Skin post op VAC and dressing to L AKA incision site, and Diabetic management Yes getting insulin in acute care setting.    Previous Home Environment (from acute therapy documentation) Living Arrangements: Children (2 adult sons) Available Help at Discharge: Family, Available 24 hours/day Type of Home: House Home Layout: One level Home Access: Stairs to enter Entrance Stairs-Rails: Right, Left Entrance Stairs-Number of Steps: 1 to get onto porch and 1 to get into house Bathroom Shower/Tub: Walk-in shower Bathroom Toilet: Handicapped height Bathroom Accessibility: Yes Home Care Services: No Additional Comments: Has prosthetic for R leg but has not started rehab with it yet.   Discharge Living Setting Plans for Discharge Living Setting: Patient's home, House, Lives with (comment) (His 2 adult sons live with patient.) Type of Home at Discharge: House Discharge Home Layout: One level Discharge Home Access: Stairs to enter Entrance Stairs-Rails: None Entrance Stairs-Number of Steps: 2 steps Discharge Bathroom Shower/Tub: Walk-in shower, Curtain Discharge Bathroom Toilet: Handicapped height Discharge Bathroom Accessibility: Yes How Accessible: Accessible via wheelchair, Accessible via walker Does the patient have any problems obtaining your medications?: No   Social/Family/Support Systems Patient Roles: Parent (Has 2 adult sons.) Contact Information: Jamal Shams - son - [phone] Anticipated Caregiver: Jamal and Elijab - sons Anticipated Caregiver's Contact Information: [name] - son - [phone] Ability/Limitations of Caregiver: Eldest son works from home.  Younger son works from 5 pm to 5 am.  Sons cross cover so  patient has caregiver assistance. Caregiver Availability: 24/7 Discharge Plan Discussed with Primary Caregiver: Yes Is Caregiver In Agreement with Plan?: Yes Does Caregiver/Family have Issues with Lodging/Transportation while Pt is in Rehab?: No   Goals Patient/Family Goal for Rehab: PT/OT min assist goals Expected length of stay: 12-14 days Pt/Family Agrees to Admission and willing to participate: Yes Program Orientation Provided & Reviewed with Pt/Caregiver Including Roles  & Responsibilities: Yes   Decrease burden of Care through IP rehab admission:  N/A   Possible need for SNF placement upon discharge: Not planned   Patient Condition: I have reviewed medical records from [facility], spoken with CM, and patient. I met with patient at the bedside for inpatient rehabilitation assessment.  Patient will benefit from ongoing PT and OT, can actively participate in 3 hours of therapy a day 5 days of the week, and can make measurable gains during the admission.  Patient will also benefit from the coordinated team approach during an Inpatient Acute Rehabilitation admission.  The patient will receive intensive therapy as well as Rehabilitation physician, nursing, social worker, and care management interventions.  Due to bladder management, bowel management, safety, skin/wound care, disease management, medication administration, pain management, and patient education the patient requires 24 hour a day rehabilitation nursing.  The patient is currently max A with mobility and basic ADLs.  Discharge setting and therapy post discharge at home with home health is anticipated.  Patient has agreed to participate in the Acute Inpatient Rehabilitation Program and will admit today.   Preadmission Screen Completed By:   B , 08/13/2022 10:56 AM ______________________________________________________________________   Discussed status with Dr. Shtridelman  on 08/13/22 at 930 and received approval  for admission today.   Admission Coordinator:   B , CCC-SLP, time   1100/Date 08/13/22    Assessment/Plan: Diagnosis: AKA due to gangrene LLE Does the need for close, 24 hr/day Medical supervision in concert with the patient's rehab needs make it unreasonable for this patient to be served in a less intensive setting? Yes Co-Morbidities requiring supervision/potential complications: ESRD, anemia, hyponatremia, CAD< DM2, constipation Due to bladder management, bowel management, safety, skin/wound care, disease management, medication administration, pain management, and patient education, does the patient require 24 hr/day rehab nursing? Yes Does the patient require coordinated care of a physician, rehab nurse, PT, OT, and SLP to address physical and functional deficits in the context of the above medical diagnosis(es)? Yes Addressing deficits in the following areas: balance, endurance, locomotion, strength, transferring, bowel/bladder control, bathing, dressing, feeding, grooming, toileting, and psychosocial support Can the patient actively participate in an intensive therapy program of at least 3 hrs of therapy 5 days a week? Yes The potential for patient to make measurable gains while on inpatient rehab is good Anticipated functional outcomes upon discharge from inpatient rehab: min assist PT, min assist OT, min assist SLP Estimated rehab length of stay to reach the above functional goals is: 12-14 Anticipated discharge destination: Home 10. Overall Rehab/Functional Prognosis: good     MD Signature: Yuri Shtridelman       

## 2022-08-13 NOTE — Progress Notes (Addendum)
Received patient before the start of the treatment.Awake,alert and oriented x 3. Vitals stable,though his blood pressure was on the soft side.  Access used : Right HD catheter that works well .  Duration of treatment : 3 hours .  Bolus given 100 cc NS bolus  Fluid removed : None ,positive of 200 cc from the HD machine.  Hemodialysis treatment issue : At the very start of the treatment ,sbp dropped to mid 80"s ,100 cc NS bolus given..Since then blood pressure become stable  but last 30 minutes of the treatment his sbp were dropping again .  Hands off to the patient's nurse.

## 2022-08-13 NOTE — H&P (Incomplete)
Physical Medicine and Rehabilitation Admission H&P    Chief Complaint  Patient presents with   Nausea   Dizziness  : PJA:SNKNL L [place] 51 year old right-handed male well-known to rehab services right below-knee amputation 06/19/2018 as well as left transmetatarsal amputation 05/10/2016 receiving inpatient rehab services 06/22/2018 - 07/01/2018 and has since undergone revision of right BKA to AKA 01/08/2022, multiple finger amputations,, quit smoking 2 years ago, chronic combined CHF with ejection fraction 45%, chronic orthostatic hypotension, end-stage renal disease with hemodialysis PVD maintained on aspirin and Plavix.  Per chart review patient lives with his 2 adult aged sons.  1 level home with one-step to entry.  He has a prosthesis for right leg but started using it yet.  Presented 08/05/2022 with gangrenous changes of the left lower extremity who failed conservative measures and underwent elective left AKA 08/07/2022 per Dr. Sharol Given.  Wound VAC applied.  Hospital course hemodialysis ongoing as per renal services.  Acute on chronic anemia 9.5.  He was cleared to begin subcutaneous heparin for DVT prophylaxis.  Therapy evaluations completed due to patient decreased functional mobility was admitted for a comprehensive rehab program.  Review of Systems  Constitutional:  Negative for chills and fever.  HENT:  Negative for hearing loss.   Eyes:  Negative for blurred vision and double vision.  Respiratory:  Negative for cough and wheezing.        Shortness of breath with exertion  Cardiovascular:  Positive for leg swelling. Negative for chest pain and palpitations.  Gastrointestinal:  Positive for constipation.       GERD  Skin:  Negative for rash.  Neurological:  Positive for dizziness. Negative for headaches.  Psychiatric/Behavioral:  Positive for depression.        Anxiety  All other systems reviewed and are negative.  Past Medical History:  Diagnosis Date   Allergy    Anemia     getting Venofer with dialysis   Anxiety    self reported, sometimes-situational anxiety   Arthritis    bilateral hands/LEFT hip   CHF (congestive heart failure) (HCC)    Chronic kidney disease    t th s dialysis- [place] ---ESRD   Coronary artery disease    Depression    self reported, sometimes-situational depression   Diabetes mellitus    type II- on meds   Dyspnea    sometimes- fluid   GERD (gastroesophageal reflux disease)    on meds   History of blood transfusion    Hypertension    no longer on medication since starting on Hemodialysis   Myocardial infarction ([place]) 2019   Peripheral vascular disease ([place])    Sleep apnea    does not use CPAP   Past Surgical History:  Procedure Laterality Date   A/V FISTULAGRAM Left 03/06/2020   Procedure: A/V FISTULAGRAM;  Surgeon: Waynetta Sandy, MD;  Location: [facility];  Service: Cardiovascular;  Laterality: Left;   ABDOMINAL AORTOGRAM N/A 01/07/2022   Procedure: ABDOMINAL AORTOGRAM;  Surgeon: Waynetta Sandy, MD;  Location: [facility];  Service: Cardiovascular;  Laterality: N/A;   ABDOMINAL AORTOGRAM W/LOWER EXTREMITY N/A 04/09/2021   Procedure: ABDOMINAL AORTOGRAM W/LOWER EXTREMITY;  Surgeon: Waynetta Sandy, MD;  Location: [facility];  Service: Cardiovascular;  Laterality: N/A;   AMPUTATION Left 05/10/2016   Procedure: Left Transmetatarsal Amputation;  Surgeon: [name], MD;  Location: [facility];  Service: Orthopedics;  Laterality: Left;   AMPUTATION Right 05/16/2018   Procedure: PARTIAL  RAY AMPUTATION FIFTH TOE RIGHT FOOT;  Surgeon: [name], DPM;  Location: [facility] [place];  Service: Podiatry;  Laterality: Right;   AMPUTATION Right 06/17/2018   Procedure: AMPUTATION 4th RAY Right Foot;  Surgeon: Trula Slade, DPM;  Location: [facility];  Service: Podiatry;  Laterality: Right;   AMPUTATION Right 06/19/2018   Procedure: RIGHT BELOW KNEE AMPUTATION;  Surgeon: [name], MD;   Location: [place];  Service: Orthopedics;  Laterality: Right;   AMPUTATION Left 10/05/2021   Procedure: LEFT RING FINGER AMPUTATION;  Surgeon: Daryll Brod, MD;  Location: [place];  Service: Orthopedics;  Laterality: Left;  45 MIN   AMPUTATION Left 11/15/2021   Procedure: LEFT INDEX FINGER AMPUTATION;  Surgeon: Leanora Cover, MD;  Location: [facility];  Service: Orthopedics;  Laterality: Left;  60 MIN   AMPUTATION Right 01/08/2022   Procedure: AMPUTATION ABOVE KNEE;  Surgeon: Waynetta Sandy, MD;  Location: [facility];  Service: Vascular;  Laterality: Right;   AMPUTATION Bilateral 01/12/2022   Procedure: LEFT INDEX FINGER AMPUTATION, RIGHT INDEX, LONG AND SMALL DIGITS AMPUTATION;  Surgeon: Leanora Cover, MD;  Location: [facility];  Service: Orthopedics;  Laterality: Bilateral;   AMPUTATION Right 04/01/2022   Procedure: RIGHT INDEX FINGER AMPUTATION AT METACARPAL PHALANGEAL JOINT, INCISION AND DRAINAGE RIGHT HAND;  Surgeon: Leanora Cover, MD;  Location: [facility];  Service: Orthopedics;  Laterality: Right;  90 MIN   AMPUTATION Right 05/23/2022   Procedure: RIGHT INDEX FINGER RAY AMPUTATION; RIGHT LONG, RING, AND SMALL FINGER AMPUTATIONS;  Surgeon: Leanora Cover, MD;  Location: [place];  Service: Orthopedics;  Laterality: Right;  90 MIN   AMPUTATION Left 08/07/2022   Procedure: LEFT ABOVE KNEE AMPUTATION;  Surgeon: [name], MD;  Location: [place];  Service: Orthopedics;  Laterality: Left;   AORTIC ARCH ANGIOGRAPHY N/A 01/07/2022   Procedure: AORTIC ARCH ANGIOGRAPHY;  Surgeon: Waynetta Sandy, MD;  Location: [facility];  Service: Cardiovascular;  Laterality: N/A;   APPLICATION OF WOUND VAC Right 06/19/2018   Procedure: APPLICATION OF WOUND VAC;  Surgeon: [name], MD;  Location: [place];  Service: Orthopedics;  Laterality: Right;   APPLICATION OF WOUND VAC Left 08/07/2022   Procedure: APPLICATION OF WOUND VAC;  Surgeon: [name], MD;  Location: [facility];  Service: Orthopedics;  Laterality:  Left;   AV FISTULA PLACEMENT Left 11/25/2019   Procedure: LEFT ARM Brachiocephalic  ARTERIOVENOUS (AV) FISTULA CREATION;  Surgeon: Rosetta Posner, MD;  Location: [facility];  Service: Vascular;  Laterality: Left;   [place] Left 07/19/2020   Procedure: LEFT SECOND STAGE [place];  Surgeon: Waynetta Sandy, MD;  Location: [place];  Service: Vascular;  Laterality: Left;   CIRCUMCISION  09/02/2011   Procedure: CIRCUMCISION ADULT;  Surgeon: Molli Hazard, MD;  Location: [facility] [place];  Service: Urology;  Laterality: N/A;   CORONARY STENT INTERVENTION N/A 07/29/2018   Procedure: CORONARY STENT INTERVENTION;  Surgeon: Leonie Man, MD;  Location: [facility];  Service: Cardiovascular;  Laterality: N/A;   DEBRIDEMENT AND CLOSURE WOUND Left 11/15/2021   Procedure: LEFT HAND WOUND CLOSURE;  Surgeon: Leanora Cover, MD;  Location: [facility];  Service: Orthopedics;  Laterality: Left;   I & D EXTREMITY  09/02/2011   Procedure: IRRIGATION AND DEBRIDEMENT EXTREMITY;  Surgeon: Theodoro Kos, DO;  Location: [facility];  Service: Plastics;  Laterality: N/A;   INCISION AND DRAINAGE OF WOUND  08/12/2011   Procedure: IRRIGATION  AND DEBRIDEMENT WOUND;  Surgeon: Zenovia Jarred, MD;  Location: [place];  Service: General;;  perineum   INSERTION OF DIALYSIS CATHETER N/A 11/25/2019   Procedure: INSERTION OF [place] Internal Jugular DIALYSIS CATHETER;  Surgeon: Rosetta Posner, MD;  Location: [facility];  Service: Vascular;  Laterality: N/A;   [place]  08/12/2011   Procedure: IRRIGATION AND DEBRIDEMENT ABSCESS;  Surgeon: Molli Hazard, MD;  Location: [facility];  Service: Urology;  Laterality: N/A;  irrigation and debridment perineum     IRRIGATION AND DEBRIDEMENT ABSCESS  08/14/2011   Procedure: MINOR INCISION AND DRAINAGE OF ABSCESS;  Surgeon: Molli Hazard, MD;  Location: [facility];  Service: Urology;  Laterality:  N/A;  Perineal Wound Debridement;Placement Bilateral Testicular Thigh Pouches   LEFT HEART CATH AND CORONARY ANGIOGRAPHY N/A 07/29/2018   Procedure: LEFT HEART CATH AND CORONARY ANGIOGRAPHY;  Surgeon: Leonie Man, MD;  Location: [facility];  Service: Cardiovascular;  Laterality: N/A;   LOWER EXTREMITY ANGIOGRAPHY Bilateral 01/07/2022   Procedure: Lower Extremity Angiography;  Surgeon: Waynetta Sandy, MD;  Location: [facility];  Service: Cardiovascular;  Laterality: Bilateral;   PERIPHERAL VASCULAR ATHERECTOMY Left 04/09/2021   Procedure: PERIPHERAL VASCULAR ATHERECTOMY;  Surgeon: Waynetta Sandy, MD;  Location: [facility];  Service: Cardiovascular;  Laterality: Left;  TP trunk and peroneal arteries   PERIPHERAL VASCULAR BALLOON ANGIOPLASTY Left 04/09/2021   Procedure: PERIPHERAL VASCULAR BALLOON ANGIOPLASTY;  Surgeon: Waynetta Sandy, MD;  Location: [facility];  Service: Cardiovascular;  Laterality: Left;  Popliteal   REVISON OF ARTERIOVENOUS FISTULA Left 05/24/2020   Procedure: LEFT ARM ARTERIOVENOUS FISTULA  CONVERSION TO BASILIC VEIN FISTULA;  Surgeon: Waynetta Sandy, MD;  Location: [facility];  Service: Vascular;  Laterality: Left;   TOTAL HIP ARTHROPLASTY Left 10/11/2016   Procedure: LEFT TOTAL HIP ARTHROPLASTY ANTERIOR APPROACH;  Surgeon: Mcarthur Rossetti, MD;  Location: [facility];  Service: Orthopedics;  Laterality: Left;   UPPER EXTREMITY ANGIOGRAPHY Bilateral 01/07/2022   Procedure: Upper Extremity Angiography;  Surgeon: Waynetta Sandy, MD;  Location: [facility];  Service: Cardiovascular;  Laterality: Bilateral;   Family History  Problem Relation Age of Onset   Diabetes Other    Pancreatic cancer Mother 38   Colon cancer Neg Hx    Colon polyps Neg Hx    Esophageal cancer Neg Hx    Rectal cancer Neg Hx    Stomach cancer Neg Hx    Social History:  reports that he quit smoking about 2 years  ago. His smoking use included cigarettes. He has a 6.00 pack-year smoking history. He has never used smokeless tobacco. He reports that he does not currently use alcohol. He reports that he does not use drugs. Allergies:  Allergies  Allergen Reactions   Crestor [Rosuvastatin] Rash    Eye redness   Dilaudid [Hydromorphone] Itching   Robaxin [Methocarbamol] Other (See Comments)    Pt states medication made his arm pain worse.   Benadryl [Diphenhydramine] Rash   Medications Prior to Admission  Medication Sig Dispense Refill   acetaminophen (TYLENOL) 500 MG tablet Take 1,000 mg by mouth Every Tuesday,Thursday,and Saturday with dialysis.     allopurinol (ZYLOPRIM) 100 MG tablet Take 100 mg by mouth daily as needed (for gout).     ALPRAZolam (XANAX) 0.25 MG tablet Take 0.25 mg by mouth daily.     aspirin EC 81 MG tablet Take 81 mg by mouth daily. Swallow  whole.     atorvastatin (LIPITOR) 80 MG tablet TAKE ONE TABLET BY MOUTH ONCE DAILY 90 tablet 3   bismuth subsalicylate (PEPTO BISMOL) 262 MG/15ML suspension Take 30 mLs by mouth every 6 (six) hours as needed for indigestion.     clopidogrel (PLAVIX) 75 MG tablet TAKE ONE TABLET BY MOUTH ONCE DAILY (Patient taking differently: Take 75 mg by mouth daily.) 30 tablet 11   gabapentin (NEURONTIN) 100 MG capsule Take 100 mg by mouth 3 (three) times daily.     HUMALOG MIX 75/25 KWIKPEN (75-25) 100 UNIT/ML KwikPen Inject 30 Units into the skin daily.     linaclotide (LINZESS) 72 MCG capsule Take 1 capsule (72 mcg total) by mouth daily before breakfast. (Patient taking differently: Take 72 mcg by mouth in the morning.) 8 capsule 0   oxyCODONE (ROXICODONE) 5 MG immediate release tablet 1-2 tabs PO q6 hours prn pain (Patient taking differently: Take 5 mg by mouth every 6 (six) hours as needed for moderate pain.) 20 tablet 0   pantoprazole (PROTONIX) 40 MG tablet Take 1 tablet (40 mg total) by mouth daily. 180 tablet 0   VELPHORO 500 MG chewable tablet Chew  1,000 mg by mouth 2 (two) times daily.     ezetimibe (ZETIA) 10 MG tablet Take 1 tablet (10 mg total) by mouth daily. (Patient not taking: Reported on 05/11/2022) 30 tablet 3   insulin aspart protamine - aspart (NOVOLOG MIX 70/30 FLEXPEN) (70-30) 100 UNIT/ML FlexPen Inject 0.3 mLs (30 Units total) into the skin 2 (two) times daily with a meal. (Patient not taking: Reported on 08/08/2022) 15 mL 0   Insulin Pen Needle 31G X 5 MM MISC Use daily to inject insulin as instructed 100 each 2   sulfamethoxazole-trimethoprim (BACTRIM DS) 800-160 MG tablet Take 1 tablet by mouth 2 (two) times daily. (Patient not taking: Reported on 08/08/2022)        Home: Home Living Family/patient expects to be discharged to:: Private residence Living Arrangements: Children (2 adult sons) Available Help at Discharge: Family, Available 24 hours/day Type of Home: House Home Access: Stairs to enter [employer] of Steps: 1 to get onto porch and 1 to get into house Entrance Stairs-Rails: Right, Left Home Layout: One level Bathroom Shower/Tub: Multimedia programmer: Handicapped height Bathroom Accessibility: Yes Home Equipment: Conservation officer, nature (2 wheels), Wheelchair - manual, Shower seat, BSC/3in1, Cane - single point, Hand held shower head, Tub bench Additional Comments: Has prosthetic for R leg but has not started rehab with it yet.   Functional History: Prior Function Prior Level of Function : Needs assist Physical Assist : Mobility (physical), ADLs (physical) Mobility (physical): Transfers ADLs (physical): Dressing, Bathing, Toileting, Grooming Mobility Comments: Primarily does lateral scoots for transfers  without sliding board; reports normally could scoot on his own but as he has gotten sicker his sons have assisted ADLs Comments: Reports sons assisted with ADLs (pt does chair push up and sons pull up pants). Took showers using tub bench.  Functional Status:  Mobility: Bed  Mobility Overal bed mobility: Needs Assistance Bed Mobility: Rolling Rolling: Mod assist Supine to sit: Mod assist, +2 for physical assistance Sit to supine: Min assist General bed mobility comments: increased time; cues to not push with R hand; multiple rolls during session for strengthening and bed straightening Transfers General transfer comment: Pt unable to tolerate today due to pain (received Neurotin prior to eval) also limited by not being able to weight bear in R hand.  Would require  at least mod x 2 for anterior/posterior transfers Ambulation/Gait General Gait Details: defer    ADL: ADL Overall ADL's : Needs assistance/impaired Eating/Feeding: Minimal assistance, Bed level Grooming: Wash/dry hands, Wash/dry face, Oral care, Minimal assistance, Sitting, Bed level Upper Body Bathing: Moderate assistance, Bed level, Sitting Lower Body Bathing: Total assistance, Bed level Upper Body Dressing : Moderate assistance, Sitting, Bed level Lower Body Dressing: Total assistance, Bed level Toilet Transfer: +2 for physical assistance, Total assistance, Anterior/posterior, BSC/3in1 Toilet Transfer Details (indicate cue type and reason): based on clinical judgement Toileting- Clothing Manipulation and Hygiene: Total assistance, Bed level  Cognition: Cognition Overall Cognitive Status: Within Functional Limits for tasks assessed Orientation Level: Oriented X4 Cognition Arousal/Alertness: Awake/alert Behavior During Therapy: WFL for tasks assessed/performed Overall Cognitive Status: Within Functional Limits for tasks assessed  Physical Exam: Blood pressure 101/65, pulse 94, temperature 97.6 F (36.4 C), temperature source Oral, resp. rate 18, height '6\' 1"'$  (1.854 m), weight 82 kg, SpO2 97 %.   General: No apparent distress, chronically ill-appearing HEENT: Head is normocephalic, atraumatic, PERRLA, EOMI, sclera anicteric, oral mucosa pink and moist, dentition intact, ext ear canals  clear,  Neck: Supple without JVD or lymphadenopathy Heart: Reg rate and rhythm. No murmurs rubs or gallops Chest: CTA bilaterally without wheezes, rales, or rhonchi; no distress Abdomen: Soft, non-tender, non-distended, bowel sounds positive. Extremities: No clubbing, cyanosis, or edema. Pulses are 2+ Psych: Pt's affect is appropriate. Pt is cooperative Skin: Clean and intact without signs of breakdown Neuro: Alert and oriented to person and place.  Knows December but does not know year or day.  Follows commands.  Cranial nerves II through XII intact. Sensation to light touch intact in all 4 extremities Strength 5 out of 5 bilateral shoulder abduction, elbow flexion and extension Able to lift bilateral lower extremities Musculoskeletal: Patient with multiple finger amputations.  He has amputations of right second, third and fifth digits at MCP and fourth digit PIP.  He has amputations of the left second and fourth digits.  Left third and fifth digits with flexion contractures.  Right AKA is well-healed.  Left AKA with wound VAC dressing in place draining serosanguineous fluid.  Appropriately tender  No joint swelling noted  Right IJ TDC Right upper extremity IV     Results for orders placed or performed during the hospital encounter of 08/05/22 (from the past 48 hour(s))  Glucose, capillary     Status: Abnormal   Collection Time: 08/11/22 11:22 AM  Result Value Ref Range   Glucose-Capillary 120 (H) 70 - 99 mg/dL    Comment: Glucose reference range applies only to samples taken after fasting for at least 8 hours.  Glucose, capillary     Status: Abnormal   Collection Time: 08/11/22  5:47 PM  Result Value Ref Range   Glucose-Capillary 169 (H) 70 - 99 mg/dL    Comment: Glucose reference range applies only to samples taken after fasting for at least 8 hours.  Glucose, capillary     Status: Abnormal   Collection Time: 08/12/22  3:17 AM  Result Value Ref Range   Glucose-Capillary 160 (H)  70 - 99 mg/dL    Comment: Glucose reference range applies only to samples taken after fasting for at least 8 hours.  Glucose, capillary     Status: Abnormal   Collection Time: 08/12/22  9:13 AM  Result Value Ref Range   Glucose-Capillary 140 (H) 70 - 99 mg/dL    Comment: Glucose reference range applies only to samples taken  after fasting for at least 8 hours.  Glucose, capillary     Status: Abnormal   Collection Time: 08/12/22 11:57 AM  Result Value Ref Range   Glucose-Capillary 150 (H) 70 - 99 mg/dL    Comment: Glucose reference range applies only to samples taken after fasting for at least 8 hours.  Glucose, capillary     Status: None   Collection Time: 08/12/22  4:57 PM  Result Value Ref Range   Glucose-Capillary 99 70 - 99 mg/dL    Comment: Glucose reference range applies only to samples taken after fasting for at least 8 hours.  Glucose, capillary     Status: Abnormal   Collection Time: 08/13/22 12:06 AM  Result Value Ref Range   Glucose-Capillary 128 (H) 70 - 99 mg/dL    Comment: Glucose reference range applies only to samples taken after fasting for at least 8 hours.  Glucose, capillary     Status: Abnormal   Collection Time: 08/13/22  7:49 AM  Result Value Ref Range   Glucose-Capillary 148 (H) 70 - 99 mg/dL    Comment: Glucose reference range applies only to samples taken after fasting for at least 8 hours.  CBC     Status: Abnormal   Collection Time: 08/13/22  9:19 AM  Result Value Ref Range   WBC 13.2 (H) 4.0 - 10.5 K/uL   RBC 3.25 (L) 4.22 - 5.81 MIL/uL   Hemoglobin 9.5 (L) 13.0 - 17.0 g/dL   HCT 31.2 (L) 39.0 - 52.0 %   MCV 96.0 80.0 - 100.0 fL   MCH 29.2 26.0 - 34.0 pg   MCHC 30.4 30.0 - 36.0 g/dL   RDW 18.8 (H) 11.5 - 15.5 %   Platelets 217 150 - 400 K/uL   nRBC 0.0 0.0 - 0.2 %    Comment: Performed at [facility] [address], [place], [place] [address]   No results found.    Blood pressure 101/65, pulse 94, temperature 97.6 F (36.4 C),  temperature source Oral, resp. rate 18, height '6\' 1"'$  (1.854 m), weight 82 kg, SpO2 97 %.  Medical Problem List and Plan: 1. Functional deficits secondary to left AKA 08/05/2022 due to gangrenous changes.  Status post wound VAC.  History of right TKA 01/08/2022  -patient may not shower  -ELOS/Goals: 12-14 days , PT/OT min assist   -Admit to CIR 2.  Antithrombotics: -DVT/anticoagulation:  Pharmaceutical: Heparin  -antiplatelet therapy: Aspirin 81 mg daily and Plavix 75 mg daily 3. Pain Management: Neurontin 100 mg 3 times daily, oxycodone as needed 4. Mood/Behavior/Sleep: Xanax 0.25 mg daily.  Provide emotional support  -antipsychotic agents: N/A 5. Neuropsych/cognition: This patient is capable of making decisions on his own behalf. 6. Skin/Wound Care: Routine skin checks 7. Fluids/Electrolytes/Nutrition: Routine in and outs with follow-up chemistries 8.  End-stage renal disease.  Hemodialysis as directed 9.  Acute on chronic anemia.  Follow-up CBC 10.  Diabetes mellitus.  Hemoglobin A1c 6.3.  Tradjenta 5 mg daily/SSI 11.  Chronic orthostasis.  ProAmatine 10 mg 3 times daily.  Monitor with increased mobility 12.  Constipation.  Linzess daily and MiraLAX as needed 13.  GERD.  Protonix 14.  Chronic combined CHF.  Ejection fraction 45%.  Monitor for any signs of fluid overload. Daily weights  Cathlyn Parsons, Hershal Coria 08/13/2022

## 2022-08-13 NOTE — Progress Notes (Signed)
PROGRESS NOTE    Edward [name]  WUJ:[id number] DOB: 20-Nov-1970 DOA: 08/05/2022 PCP: [name], MD   Brief Narrative:  51 y.o. male with medical history significant of ESRD on HD, PVD status post multiple amputations including right AKA, and multiple fingers amputation, and left transmetatarsal amputation, with chronic left leg and foot and left shin wound, IDDM, combined chronic combined CHF with LVEF 45%, chronic orthostatic hypotension, recurrent hyperkalemia, presented with worsening nausea, vomiting and dizziness.  On presentation, he was bradycardic and hypotensive: Blood pressure responded to a small IV bolus of 100 ml normal saline.  Potassium of 7, creatinine of 8.8.  EKG showed sinus bradycardia with T wave.  He was given Lokelma, D50 and insulin and underwent emergent hemodialysis.  Vascular surgery and orthopedic surgery were consulted.  He underwent left AKA on 08/07/2022 by orthopedics.  Assessment & Plan:   Left lower extremity dry gangrene now status post left AKA/sepsis present on admission/lactic acidosis Seen by vascular surgery who did not feel that patient had any revascularization options.  AKA was recommended.   Was seen by Dr. Sharol Given and underwent AKA on 12/20.  Wound VAC in place.  To be discharged with wound VAC.  To follow-up with Dr. Sharol Given in 1 week after discharge. Antibiotics have been discontinued. Pain control. Physical therapy is following.  Inpatient rehabilitation is recommended. Had an episode of nausea yesterday.  Abdomen is benign.  Appetite is poor.  Continue to monitor.  End-stage renal disease on hemodialysis/Hyperkalemia/Acute metabolic acidosis Presented with potassium of 7.  Required oral Lokelma, D50 and insulin and underwent emergent hemodialysis.   Nephrology continues to follow.   Dialysis as per nephrology.    Hypotension Borderline low blood pressures have been noted.  Thought to be due to narcotics.  Not on any blood pressure lowering agents  at this time.  Has not received intravenous narcotics in several days.  Has been receiving oxycodone. Blood pressure is low but stable.  He remains asymptomatic.  Continue to monitor for now.  Anemia of chronic disease Hemoglobin is stable.  No evidence for overt bleeding.  Hyponatremia Resolved  Acute transaminitis/Cholelithiasis LFTs have improved. Right upper quadrant ultrasound showed cholelithiasis with gallbladder wall thickening without sonographic Murphy.  CBD not dilated.   Hepatitis panel negative.  CAD/Chronic combined heart failure Volume managed by dialysis. Last echocardiogram in our system was from 2022 and his EF is noted to be 50 to 55%.  Grade 2 diastolic dysfunction was noted.  Diabetes mellitus type 2 with hyperglycemia Monitor blood sugars.  Continue linagliptin.  CBGs with SSI.  HbA1c 6.3 in September.  Constipation Patient noted to be on Linzess and Colace.  He mentioned that he has been having loose stools.  But not frequent stools.  He has been having only 1 more bowel movement per day according to charting but it has been loose.  Colace was discontinued.  Linzess is being continued for now.    Goals of care Palliative care consultation appreciated: Currently remains full code  DVT prophylaxis: Heparin subcutaneous Code Status: Full Family Communication: None at bedside Disposition Plan: Inpatient rehab   Status is: Inpatient Remains inpatient appropriate because: Left AKA.  Needs inpatient rehab  Consultants: Nephrology/vascular surgery/orthopedics.  Palliative care  Procedures: Left AKA on 08/07/2022  Antimicrobials: Cefepime, vancomycin and Flagyl from 08/06/2022-08/07/2022  Subjective: Had an vomiting yesterday morning.  He thinks that was due to several medications that he was given at the same time.  Denies any nausea or  abdominal pain currently.  Objective: Vitals:   08/12/22 0900 08/12/22 1554 08/12/22 1956 08/13/22 0408  BP: 103/74  96/60 97/71 100/71  Pulse: 88 86 87 92  Resp: '19 18 19 17  '$ Temp:  (!) 97.5 F (36.4 C) 98.6 F (37 C) 98 F (36.7 C)  TempSrc: Oral Oral Oral Oral  SpO2: 94% 100% 96% 97%  Weight:      Height:        Intake/Output Summary (Last 24 hours) at 08/13/2022 0845 Last data filed at 08/12/2022 1057 Gross per 24 hour  Intake 474 ml  Output --  Net 474 ml    Filed Weights   08/10/22 1255 08/10/22 1323 08/12/22 0536  Weight: 76.7 kg 76.7 kg 82 kg    Examination:  General appearance: Awake alert.  In no distress Resp: Clear to auscultation bilaterally.  Normal effort Cardio: S1-S2 is normal regular.  No S3-S4.  No rubs murmurs or bruit GI: Abdomen is soft.  Nontender nondistended.  Bowel sounds are present normal.  No masses organomegaly Extremities: No status post bilateral AKA.  Wound VAC noted on the left.   Data Reviewed: I have personally reviewed following labs and imaging studies  CBC: Recent Labs  Lab 08/07/22 0321 08/08/22 0435 08/09/22 0315 08/10/22 0240 08/10/22 0858  WBC 9.3 9.5 11.6* 9.7 8.1  NEUTROABS 7.8* 7.9* 9.2* 7.4  --   HGB 9.8* 8.9* 8.9* 9.6* 8.7*  HCT 31.6* 28.8* 28.1* 29.9* 27.4*  MCV 94.6 95.0 94.9 93.7 93.5  PLT 213 176 154 169 371    Basic Metabolic Panel: Recent Labs  Lab 08/07/22 0321 08/08/22 0435 08/09/22 0315 08/09/22 1030 08/10/22 0240  NA 136 135 137  --  135  K 3.8 4.0 4.5  --  4.5  CL 96* 95* 97*  --  95*  CO2 '22 24 22  '$ --  21*  GLUCOSE 157* 195* 156*  --  191*  BUN 30* 48* 30*  --  62*  CREATININE 4.37* 6.44* 4.72*  --  6.29*  CALCIUM 8.6* 8.4* 8.7*  --  8.7*  PHOS  --   --   --  4.9*  --     GFR: Estimated Creatinine Clearance: 15.5 mL/min (A) (by C-G formula based on SCr of 6.29 mg/dL (H)).  Liver Function Tests: Recent Labs  Lab 08/07/22 0321 08/08/22 1401 08/09/22 0315 08/10/22 0240  AST 451* 113* 48* 30  ALT 273* 138* 59* 28  ALKPHOS 132* 114 102 111  BILITOT 1.2 0.9 1.1 1.5*  PROT 6.5 6.4* 6.4* 6.8   ALBUMIN 2.4* 2.3* 2.5* 2.6*      CBG: Recent Labs  Lab 08/12/22 0913 08/12/22 1157 08/12/22 1657 08/13/22 0006 08/13/22 0749  GLUCAP 140* 150* 99 128* 148*     Sepsis Labs: Recent Labs  Lab 08/07/22 0321  PROCALCITON 10.91     Recent Results (from the past 240 hour(s))  Resp panel by RT-PCR (RSV, Flu A&B, Covid) Anterior Nasal Swab     Status: None   Collection Time: 08/05/22 12:39 PM   Specimen: Anterior Nasal Swab  Result Value Ref Range Status   SARS Coronavirus 2 by RT PCR NEGATIVE NEGATIVE Final    Comment: (NOTE) SARS-CoV-2 target nucleic acids are NOT DETECTED.  The SARS-CoV-2 RNA is generally detectable in upper respiratory specimens during the acute phase of infection. The lowest concentration of SARS-CoV-2 viral copies this assay can detect is 138 copies/mL. A negative result does not preclude SARS-Cov-2 infection and should  not be used as the sole basis for treatment or other patient management decisions. A negative result may occur with  improper specimen collection/handling, submission of specimen other than nasopharyngeal swab, presence of viral mutation(s) within the areas targeted by this assay, and inadequate number of viral copies(<138 copies/mL). A negative result must be combined with clinical observations, patient history, and epidemiological information. The expected result is Negative.  Fact Sheet for Patients:  EntrepreneurPulse.com.au  Fact Sheet for Healthcare Providers:  IncredibleEmployment.be  This test is no t yet approved or cleared by the [place] FDA and  has been authorized for detection and/or diagnosis of SARS-CoV-2 by FDA under an Emergency Use Authorization (EUA). This EUA will remain  in effect (meaning this test can be used) for the duration of the COVID-19 declaration under Section 564(b)(1) of the Act, 21 U.S.C.section 360bbb-3(b)(1), unless the authorization is terminated   or revoked sooner.       Influenza A by PCR NEGATIVE NEGATIVE Final   Influenza B by PCR NEGATIVE NEGATIVE Final    Comment: (NOTE) The Xpert Xpress SARS-CoV-2/FLU/RSV plus assay is intended as an aid in the diagnosis of influenza from Nasopharyngeal swab specimens and should not be used as a sole basis for treatment. Nasal washings and aspirates are unacceptable for Xpert Xpress SARS-CoV-2/FLU/RSV testing.  Fact Sheet for Patients: EntrepreneurPulse.com.au  Fact Sheet for Healthcare Providers: IncredibleEmployment.be  This test is not yet approved or cleared by the [place] FDA and has been authorized for detection and/or diagnosis of SARS-CoV-2 by FDA under an Emergency Use Authorization (EUA). This EUA will remain in effect (meaning this test can be used) for the duration of the COVID-19 declaration under Section 564(b)(1) of the Act, 21 U.S.C. section 360bbb-3(b)(1), unless the authorization is terminated or revoked.     Resp Syncytial Virus by PCR NEGATIVE NEGATIVE Final    Comment: (NOTE) Fact Sheet for Patients: EntrepreneurPulse.com.au  Fact Sheet for Healthcare Providers: IncredibleEmployment.be  This test is not yet approved or cleared by the [place] FDA and has been authorized for detection and/or diagnosis of SARS-CoV-2 by FDA under an Emergency Use Authorization (EUA). This EUA will remain in effect (meaning this test can be used) for the duration of the COVID-19 declaration under Section 564(b)(1) of the Act, 21 U.S.C. section 360bbb-3(b)(1), unless the authorization is terminated or revoked.  Performed at [facility], [place] [address], [place], [place] [address]   Culture, blood (Routine X 2) w Reflex to ID Panel     Status: None   Collection Time: 08/05/22  9:05 PM   Specimen: BLOOD RIGHT ARM  Result Value Ref Range Status   Specimen Description BLOOD RIGHT ARM   Final   Special Requests   Final    BOTTLES DRAWN AEROBIC AND ANAEROBIC Blood Culture adequate volume   Culture   Final    NO GROWTH 5 DAYS Performed at [facility], [place] [address], [place], [place] [address]    Report Status 08/10/2022 FINAL  Final  Culture, blood (Routine X 2) w Reflex to ID Panel     Status: None   Collection Time: 08/05/22  9:05 PM   Specimen: BLOOD RIGHT ARM  Result Value Ref Range Status   Specimen Description BLOOD RIGHT ARM  Final   Special Requests   Final    BOTTLES DRAWN AEROBIC AND ANAEROBIC Blood Culture adequate volume   Culture   Final    NO GROWTH 5 DAYS Performed at [facility], [place] [address], [place], [place] [address]    Report Status 08/10/2022 FINAL  Final  MRSA Next Gen by PCR, Nasal     Status: None   Collection Time: 08/06/22 12:50 AM   Specimen: Nasal Mucosa; Nasal Swab  Result Value Ref Range Status   MRSA by PCR Next Gen NOT DETECTED NOT DETECTED Final    Comment: (NOTE) The GeneXpert MRSA Assay (FDA approved for NASAL specimens only), is one component of a comprehensive MRSA colonization surveillance program. It is not intended to diagnose MRSA infection nor to guide or monitor treatment for MRSA infections. Test performance is not FDA approved in patients less than 81 years old. Performed at [facility], [place] [address], [place], [place] [address]       Radiology Studies: No results found.   Scheduled Meds:  (feeding supplement) PROSource Plus  30 mL Oral BID BM   ALPRAZolam  0.25 mg Oral Daily   vitamin C  1,000 mg Oral Daily   aspirin EC  81 mg Oral Daily   Chlorhexidine Gluconate Cloth  6 each Topical Q0600   clopidogrel  75 mg Oral Daily   feeding supplement (NEPRO CARB STEADY)  237 mL Oral BID BM   gabapentin  100 mg Oral TID   heparin  5,000 Units Subcutaneous Q12H   insulin aspart  0-9 Units Subcutaneous TID WC   linaclotide  72 mcg Oral q AM   linagliptin  5 mg Oral Daily   midodrine  10  mg Oral TID WC   nutrition supplement (JUVEN)  1 packet Oral BID BM   pantoprazole  40 mg Oral Daily   sodium bicarbonate  650 mg Oral BID   sodium chloride flush  3 mL Intravenous Q12H   sucroferric oxyhydroxide  1,000 mg Oral BID WC   zinc sulfate  220 mg Oral Daily   Continuous Infusions:  sodium chloride     anticoagulant sodium citrate         Bonnielee Haff, MD [facility] 08/13/2022, 8:45 AM

## 2022-08-13 NOTE — Progress Notes (Signed)
[facility] Progress Note   Subjective: no new c/o's today, seen on HD  Objective Vitals:   08/13/22 1500 08/13/22 1536 08/13/22 1600 08/13/22 1630  BP: 105/72 107/76 116/69 104/65  Pulse: 83 90 91 94  Resp: (!) 29 11 (!) 23 20  Temp:      TempSrc:      SpO2: 95% (!) 89% 95% 95%  Weight:      Height:       Physical Exam General: Chronically ill appearing male in  NAD  Heart: SR 90s. S1,S2 no M/R/G Lungs: CTAB A/P Abdomen: soft, NABS Extremities: wound vac L AKA, old R AKA intact w/o edema. Sp amputated fingers both hands.  Dialysis Access: RIJ TDC drsg intact.   [facility]  4.5h  425/ 700   89kg  2/2 bath  TDC  Hep 8000+ 209md - mircera 1544m q2, last 12/16, due 12/30 - venofer '50mg'$  IV weekly - hectorol 1075mIV TIW - korsuva for itching  - velphoro 3 tabs with meals- last phos 12.5   Assessment/Plan:  L foot wound - S/P L AKA per Dr. DudSharol Given/20 ESRD -  TTS HD. OP Heparin dose too high for current wt. Lower dose on discharge. HD today.  Hypotension/volume: BP remains soft, has hx of hypotension at OP Clinic as well. Started midodrine here at 10 mg tid. Euvolemic and several kg under EDW. Will need lower EDW on discharge post AKA. No BP lowering meds. UFG zero today.   Anemia: Hgb 8.7. ESA due 08/17/2022.   Metabolic bone disease: Calcium elevated, holding hectorol for now. Very noncomplaint with binders/diet, last phos was 12.5, probably exacerbating his pruritus. Resumed binders here and last phos was down to 4.9.   Nutrition:  Renal diet. Very low albumin. Add protein supps.  Abnormal LFT's - RUQ US [place]owed cholelithiasis and gallbladder wall thickening.  Negative US [place] sign, acute hepatitis panel negative.  Per primary.  Bradycardia: Resolved Hyperkalemia:Resolved.   RobKelly SplinterD 08/13/2022, 4:37 PM  Recent Labs  Lab 08/09/22 1030 08/10/22 0240 08/10/22 0858 08/13/22 0919  HGB  --  9.6* 8.7* 9.5*  ALBUMIN  --  2.6*  --  2.5*   CALCIUM  --  8.7*  --  8.7*  PHOS 4.9*  --   --  5.7*  CREATININE  --  6.29*  --  7.96*  K  --  4.5  --  4.5     Inpatient medications:  (feeding supplement) PROSource Plus  30 mL Oral BID BM   ALPRAZolam  0.25 mg Oral Daily   vitamin C  1,000 mg Oral Daily   aspirin EC  81 mg Oral Daily   Chlorhexidine Gluconate Cloth  6 each Topical Q0600   clopidogrel  75 mg Oral Daily   feeding supplement (NEPRO CARB STEADY)  237 mL Oral BID BM   gabapentin  100 mg Oral TID   heparin  5,000 Units Subcutaneous Q12H   insulin aspart  0-9 Units Subcutaneous TID WC   linaclotide  72 mcg Oral q AM   linagliptin  5 mg Oral Daily   midodrine  10 mg Oral TID WC   nutrition supplement (JUVEN)  1 packet Oral BID BM   pantoprazole  40 mg Oral Daily   sodium bicarbonate  650 mg Oral BID   sodium chloride flush  3 mL Intravenous Q12H   sucroferric oxyhydroxide  1,000 mg Oral BID WC   zinc sulfate  220 mg  Oral Daily    sodium chloride     anticoagulant sodium citrate     sodium chloride, acetaminophen, alteplase, anticoagulant sodium citrate, bisacodyl, bismuth subsalicylate, camphor-menthol, guaiFENesin-dextromethorphan, heparin, [START ON 08/14/2022] heparin, lidocaine (PF), lidocaine-prilocaine, morphine injection, ondansetron, mouth rinse, oxyCODONE, pentafluoroprop-tetrafluoroeth, phenol, polyethylene glycol, sodium chloride flush

## 2022-08-14 ENCOUNTER — Encounter (HOSPITAL_BASED_OUTPATIENT_CLINIC_OR_DEPARTMENT_OTHER): Payer: Medicare Other | Admitting: General Surgery

## 2022-08-14 DIAGNOSIS — Z89611 Acquired absence of right leg above knee: Secondary | ICD-10-CM | POA: Diagnosis not present

## 2022-08-14 DIAGNOSIS — Z89612 Acquired absence of left leg above knee: Secondary | ICD-10-CM

## 2022-08-14 LAB — GLUCOSE, CAPILLARY
Glucose-Capillary: 131 mg/dL — ABNORMAL HIGH (ref 70–99)
Glucose-Capillary: 157 mg/dL — ABNORMAL HIGH (ref 70–99)
Glucose-Capillary: 120 mg/dL — ABNORMAL HIGH (ref 70–99)
Glucose-Capillary: 143 mg/dL — ABNORMAL HIGH (ref 70–99)

## 2022-08-14 LAB — HEPATITIS B SURFACE ANTIGEN: Hepatitis B Surface Ag: NONREACTIVE

## 2022-08-14 MED ORDER — CHLORPROMAZINE HCL 10 MG PO TABS
10.0000 mg | ORAL_TABLET | Freq: Three times a day (TID) | ORAL | Status: DC | PRN
  Administered 2022-08-14 – 2022-08-15 (×3): 10 mg via ORAL
  Filled 2022-08-14 (×5): qty 1

## 2022-08-14 MED ORDER — CHLORHEXIDINE GLUCONATE CLOTH 2 % EX PADS
6.0000 | MEDICATED_PAD | Freq: Two times a day (BID) | CUTANEOUS | Status: DC
  Administered 2022-08-14 – 2022-08-15 (×2): 6 via TOPICAL

## 2022-08-14 MED ORDER — OXYCODONE HCL 5 MG PO TABS
5.0000 mg | ORAL_TABLET | Freq: Four times a day (QID) | ORAL | Status: DC | PRN
  Administered 2022-08-14: 5 mg via ORAL
  Filled 2022-08-14: qty 1

## 2022-08-14 MED ORDER — HYDROCORTISONE 1 % EX CREA
TOPICAL_CREAM | Freq: Two times a day (BID) | CUTANEOUS | Status: DC
  Filled 2022-08-14: qty 28

## 2022-08-14 MED ORDER — HYDROCORTISONE 1 % EX LOTN
TOPICAL_LOTION | Freq: Two times a day (BID) | CUTANEOUS | Status: DC
  Filled 2022-08-14: qty 118

## 2022-08-14 NOTE — Progress Notes (Signed)
Arrived to the unit from HD via bed, upon arrival to CIR patient noted with incontinent dark loose stool. Wound Vac dressing noted to be soiled with stool. Performed hygiene and also wound vac- Prevena noted beeping indicating loose dressing edges, after hygiene; dressing reinforced. Notified on call PA.  Attempted to reach ortho MD. Continue to Monitor wound vac until further orders are given. Notified oncoming nurse.  Marlowe Kays, LPN

## 2022-08-14 NOTE — Progress Notes (Signed)
Patient ID: Edward [name], male   DOB: 09/24/1970, 51 y.o.   MRN: [id number]  Drop Arm Commode ordered through [employer]

## 2022-08-14 NOTE — Progress Notes (Signed)
[facility] Individual Statement of Services  Patient Name:  Edward [name]  Date:  08/14/2022  Welcome to the [facility].  Our goal is to provide you with an individualized program based on your diagnosis and situation, designed to meet your specific needs.  With this comprehensive rehabilitation program, you will be expected to participate in at least 3 hours of rehabilitation therapies Monday-Friday, with modified therapy programming on the weekends.  Your rehabilitation program will include the following services:  Physical Therapy (PT), Occupational Therapy (OT), Speech Therapy (ST), 24 hour per day rehabilitation nursing, Therapeutic Recreaction (TR), Neuropsychology, Care Coordinator, Rehabilitation Medicine, Nutrition Services, Pharmacy Services, and Other  Weekly team conferences will be held on Wednesdays to discuss your progress.  Your Inpatient Rehabilitation Care Coordinator will talk with you frequently to get your input and to update you on team discussions.  Team conferences with you and your family in attendance may also be held.  Expected length of stay: 12-14 Days  Overall anticipated outcome:  Min A  Depending on your progress and recovery, your program may change. Your Inpatient Rehabilitation Care Coordinator will coordinate services and will keep you informed of any changes. Your Inpatient Rehabilitation Care Coordinator's name and contact numbers are listed  below.  The following services may also be recommended but are not provided by the [facility]:   [place] will be made to provide these services after discharge if needed.  Arrangements include referral to agencies that provide these services.  Your insurance has been verified to be:  [facility] Your primary doctor is:  [name], MD  Pertinent information will be  shared with your doctor and your insurance company.  Inpatient Rehabilitation Care Coordinator:  Erlene Quan, Catherine or [phone]  Information discussed with and copy given to patient by: [name], 08/14/2022, 1:56 PM

## 2022-08-14 NOTE — Progress Notes (Signed)
New orders placed to remove wound vac today Per MD.   Marlowe Kays, LPN

## 2022-08-14 NOTE — Evaluation (Signed)
Physical Therapy Assessment and Plan  Patient Details  Name: Edward [name] MRN: [id number] Date of Birth: 12-10-1970  PT Diagnosis: Abnormal posture, Abnormality of gait, Contracture of joint: digits, Difficulty walking, Dizziness and giddiness, Muscle weakness, and Pain in L residual limb and R ribcage Rehab Potential: Fair ELOS: 10-12 days   Today's Date: 08/14/2022 PT Individual Time: 0830-0924 PT Individual Time Calculation (min): 65 min    Hospital Problem: Principal Problem:   S/P bilateral above knee amputation ([place])   Past Medical History:  Past Medical History:  Diagnosis Date   Allergy    Anemia    getting Venofer with dialysis   Anxiety    self reported, sometimes-situational anxiety   Arthritis    bilateral hands/LEFT hip   CHF (congestive heart failure) ([place])    Chronic kidney disease    t th s dialysis- [place] ---ESRD   Coronary artery disease    Depression    self reported, sometimes-situational depression   Diabetes mellitus    type II- on meds   Dyspnea    sometimes- fluid   GERD (gastroesophageal reflux disease)    on meds   History of blood transfusion    Hypertension    no longer on medication since starting on Hemodialysis   Myocardial infarction ([place]) 2019   Peripheral vascular disease ([place])    Sleep apnea    does not use CPAP   Past Surgical History:  Past Surgical History:  Procedure Laterality Date   A/V FISTULAGRAM Left 03/06/2020   Procedure: A/V FISTULAGRAM;  Surgeon: Waynetta Sandy, MD;  Location: [place] [facility];  Service: Cardiovascular;  Laterality: Left;   ABDOMINAL AORTOGRAM N/A 01/07/2022   Procedure: ABDOMINAL AORTOGRAM;  Surgeon: Waynetta Sandy, MD;  Location: [place] [facility];  Service: Cardiovascular;  Laterality: N/A;   ABDOMINAL AORTOGRAM W/LOWER EXTREMITY N/A 04/09/2021   Procedure: ABDOMINAL AORTOGRAM W/LOWER EXTREMITY;  Surgeon: Waynetta Sandy, MD;  Location: [facility] [facility] [facility];   Service: Cardiovascular;  Laterality: N/A;   AMPUTATION Left 05/10/2016   Procedure: Left Transmetatarsal Amputation;  Surgeon: [name], MD;  Location: [place];  Service: Orthopedics;  Laterality: Left;   AMPUTATION Right 05/16/2018   Procedure: PARTIAL RAY AMPUTATION FIFTH TOE RIGHT FOOT;  Surgeon: [name], DPM;  Location: [place];  Service: Podiatry;  Laterality: Right;   AMPUTATION Right 06/17/2018   Procedure: AMPUTATION 4th RAY Right Foot;  Surgeon: Trula Slade, DPM;  Location: [place];  Service: Podiatry;  Laterality: Right;   AMPUTATION Right 06/19/2018   Procedure: RIGHT BELOW KNEE AMPUTATION;  Surgeon: [name], MD;  Location: [place];  Service: Orthopedics;  Laterality: Right;   AMPUTATION Left 10/05/2021   Procedure: LEFT RING FINGER AMPUTATION;  Surgeon: Daryll Brod, MD;  Location: [place];  Service: Orthopedics;  Laterality: Left;  45 MIN   AMPUTATION Left 11/15/2021   Procedure: LEFT INDEX FINGER AMPUTATION;  Surgeon: Leanora Cover, MD;  Location: [place];  Service: Orthopedics;  Laterality: Left;  60 MIN   AMPUTATION Right 01/08/2022   Procedure: AMPUTATION ABOVE KNEE;  Surgeon: Waynetta Sandy, MD;  Location: [place];  Service: Vascular;  Laterality: Right;   AMPUTATION Bilateral 01/12/2022   Procedure: LEFT INDEX FINGER AMPUTATION, RIGHT INDEX, LONG AND SMALL DIGITS AMPUTATION;  Surgeon: Leanora Cover, MD;  Location: [place];  Service: Orthopedics;  Laterality: Bilateral;   AMPUTATION Right 04/01/2022   Procedure: RIGHT INDEX FINGER AMPUTATION AT METACARPAL PHALANGEAL JOINT, INCISION  AND DRAINAGE RIGHT HAND;  Surgeon: Leanora Cover, MD;  Location: [place];  Service: Orthopedics;  Laterality: Right;  90 MIN   AMPUTATION Right 05/23/2022   Procedure: RIGHT INDEX FINGER RAY AMPUTATION; RIGHT LONG, RING, AND SMALL FINGER AMPUTATIONS;  Surgeon: Leanora Cover, MD;  Location: [place];  Service: Orthopedics;  Laterality: Right;  90 MIN   AMPUTATION Left 08/07/2022    Procedure: LEFT ABOVE KNEE AMPUTATION;  Surgeon: [name], MD;  Location: [place];  Service: Orthopedics;  Laterality: Left;   AORTIC ARCH ANGIOGRAPHY N/A 01/07/2022   Procedure: AORTIC ARCH ANGIOGRAPHY;  Surgeon: Waynetta Sandy, MD;  Location: [place] [facility];  Service: Cardiovascular;  Laterality: N/A;   APPLICATION OF WOUND VAC Right 06/19/2018   Procedure: APPLICATION OF WOUND VAC;  Surgeon: [name], MD;  Location: [facility];  Service: Orthopedics;  Laterality: Right;   APPLICATION OF WOUND VAC Left 08/07/2022   Procedure: APPLICATION OF WOUND VAC;  Surgeon: [name], MD;  Location: [place];  Service: Orthopedics;  Laterality: Left;   AV FISTULA PLACEMENT Left 11/25/2019   Procedure: LEFT ARM Brachiocephalic  ARTERIOVENOUS (AV) FISTULA CREATION;  Surgeon: Rosetta Posner, MD;  Location: [place];  Service: Vascular;  Laterality: Left;   [place] Left 07/19/2020   Procedure: LEFT SECOND STAGE [place];  Surgeon: Waynetta Sandy, MD;  Location: [place];  Service: Vascular;  Laterality: Left;   CIRCUMCISION  09/02/2011   Procedure: CIRCUMCISION ADULT;  Surgeon: Molli Hazard, MD;  Location: [place];  Service: Urology;  Laterality: N/A;   CORONARY STENT INTERVENTION N/A 07/29/2018   Procedure: CORONARY STENT INTERVENTION;  Surgeon: Leonie Man, MD;  Location: [facility];  Service: Cardiovascular;  Laterality: N/A;   DEBRIDEMENT AND CLOSURE WOUND Left 11/15/2021   Procedure: LEFT HAND WOUND CLOSURE;  Surgeon: Leanora Cover, MD;  Location: [place];  Service: Orthopedics;  Laterality: Left;   I & D EXTREMITY  09/02/2011   Procedure: IRRIGATION AND DEBRIDEMENT EXTREMITY;  Surgeon: Theodoro Kos, DO;  Location: [place];  Service: Plastics;  Laterality: N/A;   INCISION AND DRAINAGE OF WOUND  08/12/2011   Procedure: IRRIGATION AND DEBRIDEMENT WOUND;  Surgeon: Zenovia Jarred, MD;   Location: [facility];  Service: General;;  perineum   INSERTION OF DIALYSIS CATHETER N/A 11/25/2019   Procedure: INSERTION OF Righ Internal Jugular DIALYSIS CATHETER;  Surgeon: Rosetta Posner, MD;  Location: [place];  Service: Vascular;  Laterality: N/A;   [place]  08/12/2011   Procedure: IRRIGATION AND DEBRIDEMENT ABSCESS;  Surgeon: Molli Hazard, MD;  Location: [place];  Service: Urology;  Laterality: N/A;  irrigation and debridment perineum     IRRIGATION AND DEBRIDEMENT ABSCESS  08/14/2011   Procedure: MINOR INCISION AND DRAINAGE OF ABSCESS;  Surgeon: Molli Hazard, MD;  Location: [place];  Service: Urology;  Laterality: N/A;  Perineal Wound Debridement;Placement Bilateral Testicular Thigh Pouches   LEFT HEART CATH AND CORONARY ANGIOGRAPHY N/A 07/29/2018   Procedure: LEFT HEART CATH AND CORONARY ANGIOGRAPHY;  Surgeon: Leonie Man, MD;  Location: [facility];  Service: Cardiovascular;  Laterality: N/A;   LOWER EXTREMITY ANGIOGRAPHY Bilateral 01/07/2022   Procedure: Lower Extremity Angiography;  Surgeon: Waynetta Sandy, MD;  Location: [facility];  Service: Cardiovascular;  Laterality: Bilateral;   PERIPHERAL VASCULAR ATHERECTOMY Left 04/09/2021   Procedure: PERIPHERAL VASCULAR ATHERECTOMY;  Surgeon: Waynetta Sandy,  MD;  Location: [facility];  Service: Cardiovascular;  Laterality: Left;  TP trunk and peroneal arteries   PERIPHERAL VASCULAR BALLOON ANGIOPLASTY Left 04/09/2021   Procedure: PERIPHERAL VASCULAR BALLOON ANGIOPLASTY;  Surgeon: Waynetta Sandy, MD;  Location: [facility];  Service: Cardiovascular;  Laterality: Left;  Popliteal   REVISON OF ARTERIOVENOUS FISTULA Left 05/24/2020   Procedure: LEFT ARM ARTERIOVENOUS FISTULA  CONVERSION TO BASILIC VEIN FISTULA;  Surgeon: Waynetta Sandy, MD;  Location: [facility];  Service: Vascular;  Laterality: Left;   TOTAL HIP ARTHROPLASTY Left 10/11/2016    Procedure: LEFT TOTAL HIP ARTHROPLASTY ANTERIOR APPROACH;  Surgeon: Mcarthur Rossetti, MD;  Location[facility];  Service: Orthopedics;  Laterality: Left;   UPPER EXTREMITY ANGIOGRAPHY Bilateral 01/07/2022   Procedure: Upper Extremity Angiography;  Surgeon: Waynetta Sandy, MD;  Location: [place] [facility];  Service: Cardiovascular;  Laterality: Bilateral;    Assessment & Plan Clinical Impression: Patient is a 51 y.o. year old male well-known to rehab services right below-knee amputation 06/19/2018 as well as left transmetatarsal amputation 05/10/2016 receiving inpatient rehab services 06/22/2018 - 07/01/2018 and has since undergone revision of right BKA to AKA 01/08/2022, multiple finger amputations,, quit smoking 2 years ago, chronic combined CHF with ejection fraction 45%, chronic orthostatic hypotension, end-stage renal disease with hemodialysis PVD maintained on aspirin and Plavix. Patient reports that he lives with his 2 adult aged sons.  1 level home with one-step to entry.  He has a prosthesis for right leg but started using it yet.  Presented 08/05/2022 with gangrenous changes of the left lower extremity, bradycardia and hypotension.  He was treated for hyperkalemia with K+ of 7. He failed conservative measures and underwent elective left AKA 08/07/2022 per Dr. Sharol Given.  Wound VAC applied.  Hospital course hemodialysis ongoing as per renal services.  Acute on chronic anemia 9.5.  He was cleared to begin subcutaneous heparin for DVT prophylaxis.  Therapy evaluations completed due to patient decreased functional mobility was admitted for a comprehensive rehab program.   Patient currently requires  +2 assist  with mobility secondary to muscle weakness and muscle joint tightness, decreased cardiorespiratoy endurance, and decreased sitting balance, decreased standing balance, decreased postural control, decreased balance strategies, and difficulty maintaining precautions.  Prior to  hospitalization, patient was mod with mobility and lived with Son (2 adult sons) in a House home.  Home access is 1 to get onto porch and 1 to get into houseStairs to enter.  Patient will benefit from skilled PT intervention to maximize safe functional mobility, minimize fall risk, and decrease caregiver burden for planned discharge home with 24 hour assist.  Anticipate patient will benefit from follow up [facility] at discharge.  PT - End of Session Activity Tolerance: Tolerates 30+ min activity with multiple rests Endurance Deficit: Yes Endurance Deficit Description: required rest break once sitting upright in bed PT Assessment Rehab Potential (ACUTE/IP ONLY): Fair PT Barriers to Discharge: [place] home environment;Home environment access/layout;Wound Care;Weight bearing restrictions;Other (comments) PT Barriers to Discharge Comments: pain, bilateral AKA, prosthetic for RLE but has not used yet, multiple digit amuptations PT Patient demonstrates impairments in the following area(s): Balance;Edema;Endurance;Motor;Nutrition;Pain;Perception;Skin Integrity PT Transfers Functional Problem(s): Bed Mobility;Bed to Chair;Car;Furniture PT Locomotion Functional Problem(s): Ambulation;Wheelchair Mobility;Stairs PT Plan PT Intensity: Minimum of 1-2 x/day ,45 to 90 minutes PT Frequency: 5 out of 7 days PT Duration Estimated Length of Stay: 10-12 days PT Treatment/Interventions: Community reintegration;Ambulation/gait training;DME/adaptive equipment instruction;Neuromuscular re-education;Psychosocial support;Stair training;UE/LE Strength taining/ROM;Wheelchair propulsion/positioning;Balance/vestibular training;Discharge planning;Pain management;Skin  care/wound management;Therapeutic Activities;UE/LE Coordination activities;Disease management/prevention;Functional mobility training;Patient/family education;Splinting/orthotics;Therapeutic Exercise;Visual/perceptual remediation/compensation PT Transfers  Anticipated Outcome(s): CGA with LRAD PT Locomotion Anticipated Outcome(s): N/A PT Recommendation Recommendations for Other Services: Neuropsych consult Follow Up Recommendations: Home health PT Patient destination: Home Equipment Recommended: To be determined Equipment Details: has manual WC, RW, and SPC   PT Evaluation Precautions/Restrictions Precautions Precautions: Fall Precaution Comments: previous R AKA, multiple digit amputations Restrictions Weight Bearing Restrictions: Yes RUE Weight Bearing: Weight bearing as tolerated RLE Weight Bearing: Non weight bearing LLE Weight Bearing: Non weight bearing Pain Interference Pain Interference Pain Effect on Sleep: 3. Frequently Pain Interference with Therapy Activities: 0. Does not apply - I have not received rehabilitationtherapy in the past 5 days Pain Interference with Day-to-Day Activities: 2. Occasionally Home Living/Prior Functioning Home Living Available Help at Discharge: Family;Available 24 hours/day Type of Home: House Home Access: Stairs to enter [employer] of Steps: 1 to get onto porch and 1 to get into house Entrance Stairs-Rails: None Home Layout: One level Bathroom Shower/Tub: Multimedia programmer: Handicapped height Bathroom Accessibility: No Additional Comments: Has prosthetic for R leg but had to undergo revision due to new onset blistering, has not been able to start using R prosthetic yet. Pt has manual WC, RW, SPC, and bedside commode  Lives With: Son (2 adult sons) Prior Function Level of Independence: Needs assistance with homemaking;Needs assistance with ADLs;Needs assistance with tranfers (pt reports sons were helping with transfers prior to admission)  Able to Take Stairs?: No Driving: No Vision/Perception  Vision - History Ability to See in Adequate Light: 0 Adequate  Cognition Overall Cognitive Status: Within Functional Limits for tasks assessed Arousal/Alertness:  Awake/alert Orientation Level: Oriented X4 Memory: Appears intact Awareness: Appears intact Problem Solving: Appears intact Safety/Judgment: Appears intact Sensation Sensation Light Touch: Appears Intact Proprioception: Impaired by gross assessment Additional Comments: pt reports bilateral phantom pain Coordination Gross Motor Movements are Fluid and Coordinated: No Fine Motor Movements are Fluid and Coordinated: No Coordination and Movement Description: grossly uncoordinated due to multiple digit amputations, bilateral AKA, pain, and decreased balance strategies Finger Nose Finger Test: slow bilaterally and increased focus to perfom task due to multiple digit amputations Heel Shin Test: unable to perform due to bilateral AKA Motor  Motor Motor: Abnormal postural alignment and control Motor - Skilled Clinical Observations: grossly uncoordinated due to multiple digit amputation, bilateral AKA, pain, and decreased balance strategies  Trunk/Postural Assessment  Cervical Assessment Cervical Assessment: Within Functional Limits Thoracic Assessment Thoracic Assessment: Exceptions to [facility] (rounded shoulders) Lumbar Assessment Lumbar Assessment: Exceptions to [facility] (posterior pelvic tilt) Postural Control Postural Control: Deficits on evaluation Trunk Control: posterior lean while long sitting in bed Righting Reactions: delayed due to bilateral AKA  Balance Balance Balance Assessed: Yes Static Sitting Balance Static Sitting - Balance Support: Bilateral upper extremity supported Static Sitting - Level of Assistance: 4: Min assist Dynamic Sitting Balance Dynamic Sitting - Balance Support: Bilateral upper extremity supported Dynamic Sitting - Level of Assistance: 3: Mod assist Sitting balance - Comments: pt relied heavily on BUE support for sitting balance. Able to maintain static sitting balance with mod A fading to CGA Extremity Assessment  RLE Assessment RLE Assessment: Not  tested General Strength Comments: not formally tested; grossly 3+/5 LLE Assessment LLE Assessment: Not tested General Strength Comments: not formally tested; grossly 3/5  Care Tool Care Tool Bed Mobility Roll left and right activity        Sit to lying activity  Lying to sitting on side of bed activity   Lying to sitting on side of bed assist level: the ability to move from lying on the back to sitting on the side of the bed with no back support.: Maximal Assistance - Patient 25 - 49%     Care Tool Transfers Sit to stand transfer Sit to stand activity did not occur: Safety/medical concerns (bilateral AKA, pain)      Chair/bed transfer   Chair/bed transfer assist level: 2 Helpers (PA transfer)     Materials engineer transfer activity did not occur: Safety/medical Personal assistant transfer activity did not occur: Safety/medical concerns (pain, fatigue)        Care Tool Locomotion Ambulation Ambulation activity did not occur: Safety/medical concerns (bilateral AKA, pain)        Walk 10 feet activity Walk 10 feet activity did not occur: Safety/medical concerns (bilateral AKA, pain)       Walk 50 feet with 2 turns activity Walk 50 feet with 2 turns activity did not occur: Safety/medical concerns (bilateral AKA, pain)      Walk 150 feet activity Walk 150 feet activity did not occur: Safety/medical concerns (bilateral AKA, pain)      Walk 10 feet on uneven surfaces activity Walk 10 feet on uneven surfaces activity did not occur: Safety/medical concerns (bilateral AKA, pain)      Stairs Stair activity did not occur: Safety/medical concerns (bilateral AKA, pain)        Walk up/down 1 step activity Walk up/down 1 step or curb (drop down) activity did not occur: Safety/medical concerns (bilateral AKA, pain)      Walk up/down 4 steps activity Walk up/down 4 steps activity did not occur: Safety/medical concerns (bilateral AKA, pain)      Walk  up/down 12 steps activity Walk up/down 12 steps activity did not occur: Safety/medical concerns (bilateral AKA, pain)      Pick up small objects from floor Pick up small object from the floor (from standing position) activity did not occur: Safety/medical concerns (bilateral AKA, pain)      Wheelchair Is the patient using a wheelchair?: Yes Type of Wheelchair: Manual Wheelchair activity did not occur: Safety/medical concerns (multiple digit amputations, bilateral AKA)      Wheel 50 feet with 2 turns activity Wheelchair 50 feet with 2 turns activity did not occur: Safety/medical concerns (multiple digit amputations, bilateral AKA)    Wheel 150 feet activity Wheelchair 150 feet activity did not occur: Safety/medical concerns (multiple digit amputations, bilateral AKA)      Refer to Care Plan for Long Term Goals  SHORT TERM GOAL WEEK 1 PT Short Term Goal 1 (Week 1): pt will perform bed mobility with mod A of 1 PT Short Term Goal 2 (Week 1): pt will transfer bed<>chair with LRAD and min A of 1 PT Short Term Goal 3 (Week 1): pt will particpate in WC propulsion up to 67f  Recommendations for other services: Neuropsych  Skilled Therapeutic Intervention Evaluation completed (see details above and below) with education on PT POC and goals and individual treatment initiated with focus on functional mobility/transfers and generalized strengthening and endurance. Received pt semi-reclined in bed, pt educated on PT evaluation, CIR policies, and therapy schedule and agreeable. Pt reported pain 8/10 in L residual limb (premedicated). RN present to administer medications during session and plan for wound vac removal after PT. Provided pt with 18x18 cushion for WC. Pt does have manual WC  at home, but anti tippers need to be adjusted and pt may be power WC candidate. Pt transferred supine<>long sitting in bed with mod/max A (+2 on standby) and turned around in bed with min A +2 to prepare for PA transfer.  Pt then transferred bed<>WC via PA transfer with min/mod A +2 using chuck pad - cues to scoot 1 hip at a time and for hand placement on WC armrests to assist with scooting. Set pt up to eat breakfast and applied anti-itch cream to pt's back. Concluded session with pt sitting in WC, needs within reach, and seatbelt alarm on. Anti-tippers in place and safety plan updated - NT and RN notified of pt's current transfer status.   Mobility Bed Mobility Bed Mobility: Supine to Sit;Sitting - Scoot to Edge of Bed Supine to Sit: Maximal Assistance - Patient - Patient 25-49% Sitting - Scoot to Edge of Bed: 2 Helpers (min +2) Transfers Transfers: Government social research officer Transfer: 2 Helpers (min/mod A +2 using chuck pad) Transfer (Assistive device): Other (Comment) (chuck pad) Locomotion  Gait Ambulation: No Gait Gait: No Stairs / Additional Locomotion Stairs: No Wheelchair Mobility Wheelchair Mobility: No   Discharge Criteria: Patient will be discharged from PT if patient refuses treatment 3 consecutive times without medical reason, if treatment goals not met, if there is a change in medical status, if patient makes no progress towards goals or if patient is discharged from hospital.  The above assessment, treatment plan, treatment alternatives and goals were discussed and mutually agreed upon: by patient  Alfonse Alpers PT, DPT  08/14/2022, 10:08 AM

## 2022-08-14 NOTE — Discharge Instructions (Addendum)
Inpatient Rehab Discharge Instructions  Edward [name] Discharge date and time: No discharge date for patient encounter.   Activities/Precautions/ Functional Status: Activity: activity as tolerated Diet: renal diet/1200 mL fluid restriction Wound Care: Cleanse incision daily with Dial soap and warm water/ABD pad with Kerlix and Ace wrap to amputation site change as needed Functional status:  ___ No restrictions     ___ Walk up steps independently ___ 24/7 supervision/assistance   ___ Walk up steps with assistance ___ Intermittent supervision/assistance  ___ Bathe/dress independently ___ Walk with walker     _x__ Bathe/dress with assistance ___ Walk Independently    ___ Shower independently ___ Walk with assistance    ___ Shower with assistance ___ No alcohol     ___ Return to work/school ________  COMMUNITY REFERRALS UPON DISCHARGE:    Home Health:   PT     OT                   Agency: [facility] Phone: [phone]    Medical Equipment/Items Ordered: Hospital Bed and Drop Arm Commode                                                 Agency/Supplier: [employer] [phone]   Special Instructions: No driving smoking or alcohol  Continue hemodialysis as directed   My questions have been answered and I understand these instructions. I will adhere to these goals and the provided educational materials after my discharge from the hospital.  Patient/Caregiver Signature _______________________________ Date __________  Clinician Signature _______________________________________ Date __________  Please bring this form and your medication list with you to all your follow-up doctor's appointments.

## 2022-08-14 NOTE — Progress Notes (Signed)
Physical Therapy Session Note  Patient Details  Name: Edward [name] MRN: [id number] Date of Birth: July 01, 1971  Today's Date: 08/14/2022 PT Individual Time: 1400-1456 PT Individual Time Calculation (min): 56 min   Short Term Goals: Week 1:  PT Short Term Goal 1 (Week 1): pt will perform bed mobility with mod A of 1 PT Short Term Goal 2 (Week 1): pt will transfer bed<>chair with LRAD and min A of 1 PT Short Term Goal 3 (Week 1): pt will particpate in WC propulsion up to 58f  Skilled Therapeutic Interventions/Progress Updates:   Received pt semi-reclined in bed, pt agreeable to PT treatment and reported pain 8/10 in L residual limb radiating along anterior R side of chest (premedicated). Session with emphasis on functional mobility/transfers and generalized strengthening/endurance. Pt transferred semi-reclined<>long sitting in bed with max A and cues for logroll technique. Pt able to maintain static sitting balance with as little as CGA but then reported increased dizziness and with multiple posterior and L lateral LOB - returned to supine and assessed BP: 91/61 with continued dizziness. Took extensive rest break, then returned to sitting EOB with max A and BP reassessed: 90/64. Pt required significantly increased time, cues, and ultimately max A and use of chuck pad to transition from long sitting<>sitting EOB.   Discussed getting power WC and pt extremely receptive to recommendation. Also suggested getting ramp installed and notified CSW to provide ramp resources. Provided pt with home measurement sheet to give to sons. BP reassessed after sitting EOB for 10 minutes: 88/69, pt improvements in dizziness, but reporting increased fatigue.   Attempted to don gel liner for prosthetic in sitting, however unable to get liner underneath boxers and pt tipping over posteriorly. Returned to supine and pt required total A to re[facility]line [facility] getting gel liner and potentially  prosthetic on in supine tommorow before transitioning to sitting EOB. Pt fatigued once lying back down and required extensive rest break and briefly fell asleep. NP arrived for brief assessment - plan for dialysis tomorrow. Concluded session with pt semi-reclined in bed, needs within reach, and bed alarm on.   Therapy Documentation Precautions:  Restrictions RUE Weight Bearing: Weight bearing as tolerated RLE Weight Bearing: Non weight bearing LLE Weight Bearing: Non weight bearing  Therapy/Group: Individual Therapy AAlfonse AlpersPT, DPT  08/14/2022, 7:06 AM

## 2022-08-14 NOTE — Progress Notes (Signed)
Wound vac removed per MD. PA notified.   Marlowe Kays, LPN

## 2022-08-14 NOTE — Progress Notes (Signed)
[facility] Progress Note   Subjective: Now on CIRC. HD 08/13/2022 Ran even. Seen in room, wound vac just removed. Area is painful but says it's "OK". BP still soft.   Objective Vitals:   08/13/22 1843 08/13/22 1953 08/14/22 0439 08/14/22 1347  BP:  94/72 98/67 103/61  Pulse:  93 89 88  Resp:  '16 17 15  '$ Temp:  98 F (36.7 C) 98 F (36.7 C) 97.6 F (36.4 C)  TempSrc:  Oral Oral Oral  SpO2:  96% 98% (!) 41%  Weight: 73.7 kg     Height: 3' 11.24" (1.2 m)      PGeneral: Chronically ill appearing male in  NAD  Heart: S1,S2 no M/R/G Lungs: CTAB A/P Abdomen: soft, NABS Extremities: wound vac L AKA, old R AKA intact w/o edema. Sp amputated fingers both hands.  Dialysis Access: RIJ TDC drsg intact.   Additional Objective Labs: Basic Metabolic Panel: Recent Labs  Lab 08/09/22 0315 08/09/22 1030 08/10/22 0240 08/13/22 0919  NA 137  --  135 138  K 4.5  --  4.5 4.5  CL 97*  --  95* 96*  CO2 22  --  21* 20*  GLUCOSE 156*  --  191* 145*  BUN 30*  --  62* 78*  CREATININE 4.72*  --  6.29* 7.96*  CALCIUM 8.7*  --  8.7* 8.7*  PHOS  --  4.9*  --  5.7*   Liver Function Tests: Recent Labs  Lab 08/08/22 1401 08/09/22 0315 08/10/22 0240 08/13/22 0919  AST 113* 48* 30  --   ALT 138* 59* 28  --   ALKPHOS 114 102 111  --   BILITOT 0.9 1.1 1.5*  --   PROT 6.4* 6.4* 6.8  --   ALBUMIN 2.3* 2.5* 2.6* 2.5*   No results for input(s): "LIPASE", "AMYLASE" in the last 168 hours. CBC: Recent Labs  Lab 08/08/22 0435 08/09/22 0315 08/10/22 0240 08/10/22 0858 08/13/22 0919  WBC 9.5 11.6* 9.7 8.1 13.2*  NEUTROABS 7.9* 9.2* 7.4  --   --   HGB 8.9* 8.9* 9.6* 8.7* 9.5*  HCT 28.8* 28.1* 29.9* 27.4* 31.2*  MCV 95.0 94.9 93.7 93.5 96.0  PLT 176 154 169 184 217   Blood Culture    Component Value Date/Time   SDES BLOOD RIGHT ARM 08/05/2022 2105   SDES BLOOD RIGHT ARM 08/05/2022 2105   SPECREQUEST  08/05/2022 2105    BOTTLES DRAWN AEROBIC AND ANAEROBIC Blood Culture  adequate volume   SPECREQUEST  08/05/2022 2105    BOTTLES DRAWN AEROBIC AND ANAEROBIC Blood Culture adequate volume   CULT  08/05/2022 2105    NO GROWTH 5 DAYS Performed at [facility], [place] [address], [place], [place] [address]    CULT  08/05/2022 2105    NO GROWTH 5 DAYS Performed at [facility] [address], [place], [place] [address]    REPTSTATUS 08/10/2022 FINAL 08/05/2022 2105   REPTSTATUS 08/10/2022 FINAL 08/05/2022 2105    Cardiac Enzymes: No results for input(s): "CKTOTAL", "CKMB", "CKMBINDEX", "TROPONINI" in the last 168 hours. CBG: Recent Labs  Lab 08/13/22 1317 08/13/22 1848 08/13/22 2118 08/14/22 0615 08/14/22 1159  GLUCAP 164* 123* 134* 131* 157*   Iron Studies: No results for input(s): "IRON", "TIBC", "TRANSFERRIN", "FERRITIN" in the last 72 hours. '@lablastinr3'$ @ Studies/Results: No results found. Medications:   vitamin C  1,000 mg Oral Daily   aspirin EC  81 mg Oral Daily   Chlorhexidine Gluconate Cloth  6 each Topical Q12H   clopidogrel  75 mg Oral Daily   feeding supplement (NEPRO CARB STEADY)  237 mL Oral BID BM   gabapentin  100 mg Oral TID   heparin  5,000 Units Subcutaneous Q12H   hydrocortisone cream   Topical BID   insulin aspart  0-9 Units Subcutaneous TID WC   linaclotide  72 mcg Oral q AM   linagliptin  5 mg Oral Daily   midodrine  10 mg Oral TID WC   nutrition supplement (JUVEN)  1 packet Oral BID BM   pantoprazole  40 mg Oral Daily   sodium bicarbonate  650 mg Oral BID   sucroferric oxyhydroxide  1,000 mg Oral BID WC   zinc sulfate  220 mg Oral Daily     [facility]  4.5h  425/ 700   89kg  2/2 bath  TDC  Hep 8000+ 2036md - mircera 1556m q2, last 12/16, due 12/30 - venofer '50mg'$  IV weekly - hectorol 108mIV TIW - korsuva for itching  - velphoro 3 tabs with meals- last phos 12.5     Assessment/Plan:  L foot wound - S/P L AKA per Dr. DudSharol Given/20 ESRD -  TTS HD. OP Heparin dose too high for current wt.  Lower dose on discharge. Next HD 08/15/2022. Hypotension/volume: BP remains soft, has hx of hypotension at OP Clinic as well. Started midodrine here at 10 mg tid. Euvolemic and several kg under EDW. Will need lower EDW on discharge post AKA. No BP lowering meds.  Anemia: Hgb 9.5. ESA due 08/17/2022.   Metabolic bone disease: Calcium elevated, holding hectorol for now.  Resumed binders. PO4 5.7   Nutrition:  Renal diet. Very low albumin. Add protein supps.  Abnormal LFT's - RUQ US [place] cholelithiasis and gallbladder wall thickening.  Negative US [place] sign, acute hepatitis panel negative.  Per primary.  Bradycardia: Resolved Hyperkalemia:Resolved.   Orphia Mctigue H. Rehanna Oloughlin NP-C 08/14/2022, 2:22 PM  [id number]

## 2022-08-14 NOTE — Plan of Care (Signed)
  Problem: RH Balance Goal: LTG: Patient will maintain dynamic sitting balance (OT) Description: LTG:  Patient will maintain dynamic sitting balance with assistance during activities of daily living (OT) Flowsheets (Taken 08/14/2022 1555) LTG: Pt will maintain dynamic sitting balance during ADLs with: Supervision/Verbal cueing   Problem: RH Bathing Goal: LTG Patient will bathe all body parts with assist levels (OT) Description: LTG: Patient will bathe all body parts with assist levels (OT) Flowsheets (Taken 08/14/2022 1555) LTG: Pt will perform bathing with assistance level/cueing: Minimal Assistance - Patient > 75% LTG: Position pt will perform bathing:  Edge of bed  Shower  Supine in bed   Problem: RH Dressing Goal: LTG Patient will perform upper body dressing (OT) Description: LTG Patient will perform upper body dressing with assist, with/without cues (OT). Flowsheets (Taken 08/14/2022 1555) LTG: Pt will perform upper body dressing with assistance level of: Contact Guard/Touching assist Goal: LTG Patient will perform lower body dressing w/assist (OT) Description: LTG: Patient will perform lower body dressing with assist, with/without cues in positioning using equipment (OT) Flowsheets (Taken 08/14/2022 1555) LTG: Pt will perform lower body dressing with assistance level of: Minimal Assistance - Patient > 75% Note: Performing wheelchair push up to raise hips in order for caregiver to pull pants up if able.   Problem: RH Toileting Goal: LTG Patient will perform toileting task (3/3 steps) with assistance level (OT) Description: LTG: Patient will perform toileting task (3/3 steps) with assistance level (OT)  Flowsheets (Taken 08/14/2022 1555) LTG: Pt will perform toileting task (3/3 steps) with assistance level: Minimal Assistance - Patient > 75%   Problem: RH Toilet Transfers Goal: LTG Patient will perform toilet transfers w/assist (OT) Description: LTG: Patient will perform toilet  transfers with assist, with/without cues using equipment (OT) Flowsheets (Taken 08/14/2022 1555) LTG: Pt will perform toilet transfers with assistance level of: Minimal Assistance - Patient > 75% Note: Drop arm BSC (lateral scoot or AP transfer)   Problem: RH Pre-functional/Other (Specify) Goal: RH LTG OT (Specify) 1 Description: RH LTG OT (Specify) 1 Flowsheets (Taken 08/14/2022 1555) LTG: Other OT (Specify) 1: Caregiver(s) will be able to demonstrate a safe and effective tub/shower transfer in order to assist patient into home bathroom with limited accessibility.

## 2022-08-14 NOTE — Evaluation (Signed)
Occupational Therapy Assessment and Plan  Patient Details  Name: Edward [name] MRN: [id number] Date of Birth: [birth year]  OT Diagnosis: acute pain, muscle weakness (generalized), and Bilateral AKA Rehab Potential: Rehab Potential (ACUTE ONLY): Excellent ELOS: 14 days   Today's Date: 08/14/2022 OT Individual Time: 7867-6720 OT Individual Time Calculation (min): 76 min     Hospital Problem: Principal Problem:   S/P bilateral above knee amputation ([place])   Past Medical History:  Past Medical History:  Diagnosis Date   Allergy    Anemia    getting Venofer with dialysis   Anxiety    self reported, sometimes-situational anxiety   Arthritis    bilateral hands/LEFT hip   CHF (congestive heart failure) (HCC)    Chronic kidney disease    t th s dialysis- [place] ---ESRD   Coronary artery disease    Depression    self reported, sometimes-situational depression   Diabetes mellitus    type II- on meds   Dyspnea    sometimes- fluid   GERD (gastroesophageal reflux disease)    on meds   History of blood transfusion    Hypertension    no longer on medication since starting on Hemodialysis   Myocardial infarction ([place]) 2019   Peripheral vascular disease ([place])    Sleep apnea    does not use CPAP   Past Surgical History:  Past Surgical History:  Procedure Laterality Date   A/V FISTULAGRAM Left 03/06/2020   Procedure: A/V FISTULAGRAM;  Surgeon: Waynetta Sandy, MD;  Location: [place] [facility];  Service: Cardiovascular;  Laterality: Left;   ABDOMINAL AORTOGRAM N/A 01/07/2022   Procedure: ABDOMINAL AORTOGRAM;  Surgeon: Waynetta Sandy, MD;  Location: [place] [facility];  Service: Cardiovascular;  Laterality: N/A;   ABDOMINAL AORTOGRAM W/LOWER EXTREMITY N/A 04/09/2021   Procedure: ABDOMINAL AORTOGRAM W/LOWER EXTREMITY;  Surgeon: Waynetta Sandy, MD;  Location: [place] [facility];  Service: Cardiovascular;  Laterality: N/A;   AMPUTATION Left 05/10/2016    Procedure: Left Transmetatarsal Amputation;  Surgeon: [name], MD;  Location: [place];  Service: Orthopedics;  Laterality: Left;   AMPUTATION Right 05/16/2018   Procedure: PARTIAL RAY AMPUTATION FIFTH TOE RIGHT FOOT;  Surgeon: [name], DPM;  Location: [place];  Service: Podiatry;  Laterality: Right;   AMPUTATION Right 06/17/2018   Procedure: AMPUTATION 4th RAY Right Foot;  Surgeon: Trula Slade, DPM;  Location: [place];  Service: Podiatry;  Laterality: Right;   AMPUTATION Right 06/19/2018   Procedure: RIGHT BELOW KNEE AMPUTATION;  Surgeon: [name], MD;  Location: [place];  Service: Orthopedics;  Laterality: Right;   AMPUTATION Left 10/05/2021   Procedure: LEFT RING FINGER AMPUTATION;  Surgeon: Daryll Brod, MD;  Location: [place];  Service: Orthopedics;  Laterality: Left;  45 MIN   AMPUTATION Left 11/15/2021   Procedure: LEFT INDEX FINGER AMPUTATION;  Surgeon: Leanora Cover, MD;  Location: [place];  Service: Orthopedics;  Laterality: Left;  60 MIN   AMPUTATION Right 01/08/2022   Procedure: AMPUTATION ABOVE KNEE;  Surgeon: Waynetta Sandy, MD;  Location: [place];  Service: Vascular;  Laterality: Right;   AMPUTATION Bilateral 01/12/2022   Procedure: LEFT INDEX FINGER AMPUTATION, RIGHT INDEX, LONG AND SMALL DIGITS AMPUTATION;  Surgeon: Leanora Cover, MD;  Location: [place];  Service: Orthopedics;  Laterality: Bilateral;   AMPUTATION Right 04/01/2022   Procedure: RIGHT INDEX FINGER AMPUTATION AT METACARPAL PHALANGEAL JOINT, INCISION AND DRAINAGE RIGHT HAND;  Surgeon: Leanora Cover, MD;  Location: [facility];  Service: Orthopedics;  Laterality: Right;  90 MIN   AMPUTATION Right 05/23/2022   Procedure: RIGHT INDEX FINGER RAY AMPUTATION; RIGHT LONG, RING, AND SMALL FINGER AMPUTATIONS;  Surgeon: Leanora Cover, MD;  Location: [place];  Service: Orthopedics;  Laterality: Right;  90 MIN   AMPUTATION Left 08/07/2022   Procedure: LEFT ABOVE KNEE AMPUTATION;  Surgeon: [name], MD;  Location: [place];  Service: Orthopedics;  Laterality: Left;   AORTIC ARCH ANGIOGRAPHY N/A 01/07/2022   Procedure: AORTIC ARCH ANGIOGRAPHY;  Surgeon: Waynetta Sandy, MD;  Location: [place] [facility];  Service: Cardiovascular;  Laterality: N/A;   APPLICATION OF WOUND VAC Right 06/19/2018   Procedure: APPLICATION OF WOUND VAC;  Surgeon: [name], MD;  Location: [place];  Service: Orthopedics;  Laterality: Right;   APPLICATION OF WOUND VAC Left 08/07/2022   Procedure: APPLICATION OF WOUND VAC;  Surgeon: [name], MD;  Location: [place];  Service: Orthopedics;  Laterality: Left;   AV FISTULA PLACEMENT Left 11/25/2019   Procedure: LEFT ARM Brachiocephalic  ARTERIOVENOUS (AV) FISTULA CREATION;  Surgeon: Rosetta Posner, MD;  Location: [place];  Service: Vascular;  Laterality: Left;   [place] Left 07/19/2020   Procedure: LEFT SECOND STAGE [place];  Surgeon: Waynetta Sandy, MD;  Location: [place];  Service: Vascular;  Laterality: Left;   CIRCUMCISION  09/02/2011   Procedure: CIRCUMCISION ADULT;  Surgeon: Molli Hazard, MD;  Location: [place];  Service: Urology;  Laterality: N/A;   CORONARY STENT INTERVENTION N/A 07/29/2018   Procedure: CORONARY STENT INTERVENTION;  Surgeon: Leonie Man, MD;  Location: [place] [facility];  Service: Cardiovascular;  Laterality: N/A;   DEBRIDEMENT AND CLOSURE WOUND Left 11/15/2021   Procedure: LEFT HAND WOUND CLOSURE;  Surgeon: Leanora Cover, MD;  Location: [place];  Service: Orthopedics;  Laterality: Left;   I & D EXTREMITY  09/02/2011   Procedure: IRRIGATION AND DEBRIDEMENT EXTREMITY;  Surgeon: Theodoro Kos, DO;  Location: [place];  Service: Plastics;  Laterality: N/A;   INCISION AND DRAINAGE OF WOUND  08/12/2011   Procedure: IRRIGATION AND DEBRIDEMENT WOUND;  Surgeon: Zenovia Jarred, MD;  Location: [place];  Service: General;;  perineum   INSERTION OF DIALYSIS CATHETER N/A  11/25/2019   Procedure: INSERTION OF Righ Internal Jugular DIALYSIS CATHETER;  Surgeon: Rosetta Posner, MD;  Location: [place];  Service: Vascular;  Laterality: N/A;   [place]  08/12/2011   Procedure: IRRIGATION AND DEBRIDEMENT ABSCESS;  Surgeon: Molli Hazard, MD;  Location: [place];  Service: Urology;  Laterality: N/A;  irrigation and debridment perineum     IRRIGATION AND DEBRIDEMENT ABSCESS  08/14/2011   Procedure: MINOR INCISION AND DRAINAGE OF ABSCESS;  Surgeon: Molli Hazard, MD;  Location: [place];  Service: Urology;  Laterality: N/A;  Perineal Wound Debridement;Placement Bilateral Testicular Thigh Pouches   LEFT HEART CATH AND CORONARY ANGIOGRAPHY N/A 07/29/2018   Procedure: LEFT HEART CATH AND CORONARY ANGIOGRAPHY;  Surgeon: Leonie Man, MD;  Location: [place] [facility];  Service: Cardiovascular;  Laterality: N/A;   LOWER EXTREMITY ANGIOGRAPHY Bilateral 01/07/2022   Procedure: Lower Extremity Angiography;  Surgeon: Waynetta Sandy, MD;  Location: [place] [facility];  Service: Cardiovascular;  Laterality: Bilateral;   PERIPHERAL VASCULAR ATHERECTOMY Left 04/09/2021   Procedure: PERIPHERAL VASCULAR ATHERECTOMY;  Surgeon: Waynetta Sandy, MD;  Location: [place] [facility];  Service: Cardiovascular;  Laterality:  Left;  TP trunk and peroneal arteries   PERIPHERAL VASCULAR BALLOON ANGIOPLASTY Left 04/09/2021   Procedure: PERIPHERAL VASCULAR BALLOON ANGIOPLASTY;  Surgeon: Waynetta Sandy, MD;  Location: [place] [facility];  Service: Cardiovascular;  Laterality: Left;  Popliteal   REVISON OF ARTERIOVENOUS FISTULA Left 05/24/2020   Procedure: LEFT ARM ARTERIOVENOUS FISTULA  CONVERSION TO BASILIC VEIN FISTULA;  Surgeon: Waynetta Sandy, MD;  Location: [place];  Service: Vascular;  Laterality: Left;   TOTAL HIP ARTHROPLASTY Left 10/11/2016   Procedure: LEFT TOTAL HIP ARTHROPLASTY ANTERIOR APPROACH;  Surgeon: Mcarthur Rossetti, MD;  [facility];  Service: Orthopedics;  Laterality: Left;   UPPER EXTREMITY ANGIOGRAPHY Bilateral 01/07/2022   Procedure: Upper Extremity Angiography;  Surgeon: Waynetta Sandy, MD;  Location: [place] [facility];  Service: Cardiovascular;  Laterality: Bilateral;    Assessment & Plan Clinical Impression: :Edward [name] 51 year old right-handed male well-known to rehab services right below-knee amputation 06/19/2018 as well as left transmetatarsal amputation 05/10/2016 receiving inpatient rehab services 06/22/2018 - 07/01/2018 and has since undergone revision of right BKA to AKA 01/08/2022, multiple finger amputations,, quit smoking 2 years ago, chronic combined CHF with ejection fraction 45%, chronic orthostatic hypotension, end-stage renal disease with hemodialysis PVD maintained on aspirin and Plavix. Patient reports that he lives with his 2 adult aged sons.  1 level home with one-step to entry.  He has a prosthesis for right leg but started using it yet.  Presented 08/05/2022 with gangrenous changes of the left lower extremity, bradycardia and hypotension.  He was treated for hyperkalemia with K+ of 7. He failed conservative measures and underwent elective left AKA 08/07/2022 per Dr. Sharol Given.   Patient transferred to CIR on 08/13/2022 .    Patient currently requires max with basic self-care skills secondary to muscle weakness, decreased cardiorespiratoy endurance, and decreased sitting balance, decreased postural control, and decreased balance strategies.  Prior to hospitalization, patient could complete BADL tasks with min.  Patient will benefit from skilled intervention to decrease level of assist with basic self-care skills prior to discharge  home with 2 sons assisting as before .  Anticipate patient will require intermittent supervision and follow up home health.  OT - End of Session Activity Tolerance: Decreased this session Endurance Deficit: Yes Endurance Deficit  Description: impaired and inadequate OT Assessment Rehab Potential (ACUTE ONLY): Excellent OT Barriers to Discharge: Wound Care;Hemodialysis;Weight bearing restrictions OT Patient demonstrates impairments in the following area(s): Balance;Safety;Sensory;Motor;Endurance;Skin Integrity;Pain OT Basic ADL's Functional Problem(s): Bathing;Dressing;Toileting OT Transfers Functional Problem(s): Toilet;Tub/Shower OT Plan OT Intensity: Minimum of 1-2 x/day, 45 to 90 minutes OT Frequency: 5 out of 7 days OT Duration/Estimated Length of Stay: 14 days OT Treatment/Interventions: Balance/vestibular training;Discharge planning;Pain management;Self Care/advanced ADL retraining;Therapeutic Activities;UE/LE Coordination activities;Functional mobility training;Patient/family education;Skin care/wound managment;Therapeutic Exercise;Community reintegration;DME/adaptive equipment instruction;Neuromuscular re-education;Psychosocial support;UE/LE Strength taining/ROM;Wheelchair propulsion/positioning OT Basic Self-Care Anticipated Outcome(s): Min A OT Toileting Anticipated Outcome(s): Mod A OT Bathroom Transfers Anticipated Outcome(s): MIn A (to [facility]) OT Recommendation Recommendations for Other Services: Therapeutic Recreation consult Patient destination: Home Follow Up Recommendations: 24 hour supervision/assistance;Home health OT Equipment Recommended: 3 in 1 bedside comode   OT Evaluation Precautions/Restrictions  Precautions Precautions: Fall Precaution Comments: previous R AKA, multiple digit amputations Restrictions Weight Bearing Restrictions: Yes RUE Weight Bearing: Weight bearing as tolerated RLE Weight Bearing: Non weight bearing LLE Weight Bearing: Non weight bearing  Pain Pain Assessment Pain Scale: Faces Pain Score: 9  Pain Type: Surgical pain Pain Location: Leg Pain Orientation: Left Pain Descriptors /  Indicators: Constant;Throbbing;Sharp;Shooting Pain Onset: On-going Pain  Intervention(s): Repositioned;Emotional support Home Living/Prior Functioning Home Living Family/patient expects to be discharged to:: Private residence Living Arrangements: Children Available Help at Discharge: Family, Available 24 hours/day Type of Home: House Home Access: Stairs to enter [employer] of Steps: 1 to get onto porch and 1 to get into house Entrance Stairs-Rails: None Home Layout: One level Bathroom Shower/Tub: Multimedia programmer: Handicapped height Bathroom Accessibility: Yes (not with wheelchair but with walker) Additional Comments: Has prosthetic for R leg but had to undergo revision due to new onset blistering, has not been able to start using R prosthetic yet. Pt has manual w/c, RW, SPC, and bedside commode  Lives With: Son (2 adult sons) IADL History Homemaking Responsibilities: No Prior Function Level of Independence: Needs assistance with homemaking, Needs assistance with ADLs, Needs assistance with tranfers  Able to Take Stairs?: No Driving: No Vision Baseline Vision/History: 0 No visual deficits Ability to See in Adequate Light: 0 Adequate Patient Visual Report: No change from baseline Vision Assessment?: No apparent visual deficits Perception  Perception: Not tested Praxis Praxis: Not tested Cognition Cognition Overall Cognitive Status: Within Functional Limits for tasks assessed Arousal/Alertness: Awake/alert Orientation Level: Place;Person;Situation Person: Oriented Place: Oriented Situation: Oriented Memory: Appears intact Awareness: Appears intact Problem Solving: Appears intact Safety/Judgment: Appears intact Brief Interview for Mental Status (BIMS) Repetition of Three Words (First Attempt): 3 Temporal Orientation: Year: Correct Temporal Orientation: Month: Accurate within 5 days Temporal Orientation: Day: Correct Recall: "Sock": Yes, no cue required Recall: "Blue": Yes, no cue required Recall: "Bed": Yes, no cue  required BIMS Summary Score: 15 Sensation Sensation Light Touch: Appears Intact Hot/Cold: Impaired by gross assessment Proprioception: Impaired by gross assessment Stereognosis: Impaired by gross assessment Additional Comments: pt reports bilateral phantom pain Coordination Gross Motor Movements are Fluid and Coordinated: No Fine Motor Movements are Fluid and Coordinated: No Coordination and Movement Description: grossly uncoordinated due to multiple digit amputations, bilateral AKA, pain, and decreased balance strategies Finger Nose Finger Test: impaired Motor  Motor Motor: Abnormal postural alignment and control Motor - Skilled Clinical Observations: grossly uncoordinated due to multiple digit amputation, bilateral AKA, pain, and decreased balance strategies  Trunk/Postural Assessment  Cervical Assessment Cervical Assessment: Within Functional Limits Thoracic Assessment Thoracic Assessment: Exceptions to [facility] (rounded shoulders) Lumbar Assessment Lumbar Assessment: Exceptions to [facility] (posterior pelvic tilt) Postural Control Postural Control: Deficits on evaluation Trunk Control: posterior lean while long sitting in bed Righting Reactions: delayed due to bilateral AKA  Balance Balance Balance Assessed: Yes Static Sitting Balance Static Sitting - Balance Support: Bilateral upper extremity supported Static Sitting - Level of Assistance: 4: Min assist Dynamic Sitting Balance Dynamic Sitting - Balance Support: Bilateral upper extremity supported Dynamic Sitting - Level of Assistance: 2: Max assist Sitting balance - Comments: pt relied heavily on BUE support for sitting balance. Able to maintain static sitting balance with mod A fading to CGA Extremity/Trunk Assessment RUE Assessment General Strength Comments: AV fistula. 2nd, 3rd, and 5th digit amputation.  A/ROM WFL in shoulder, elbow, and wrist ranges. Shoulder flexion: 4-/5, abduction 4/5, IR/er: 4/5 Impaired functional  grasp. LUE Assessment General Strength Comments: 2nd, 4th, and 5th digit amputation. A/ROM WFL in shoulder, elbow, and wrist ranges. shoulder flexion: 4-/5, abduction: 3+/5, IR/er: 4-/5. Impaired functional gross grasp.  Care Tool Care Tool Self Care Eating   Eating Assist Level: Set up assist    Oral Care    Oral Care Assist Level: Set up assist    Bathing  Body parts bathed by patient: Right arm;Left arm;Chest;Abdomen;Front perineal area;Face Body parts bathed by helper: Buttocks;Right upper leg;Left upper leg Body parts n/a: Left lower leg;Right lower leg Assist Level: Moderate Assistance - Patient 50 - 74%    Upper Body Dressing(including orthotics)   What is the patient wearing?: Pull over shirt   Assist Level: Minimal Assistance - Patient > 75%    Lower Body Dressing (excluding footwear)   What is the patient wearing?: Underwear/pull up;Ace wrap/stump shrinker Assist for lower body dressing: Dependent - Patient 0%    Putting on/Taking off footwear Putting on/taking off footwear activity did not occur: N/A           Care Tool Toileting Toileting activity   Assist for toileting: Total Assistance - Patient < 25%     Care Tool Bed Mobility Roll left and right activity   Roll left and right assist level: Maximal Assistance - Patient 25 - 49%           Care Tool Transfers Sit to stand transfer Sit to stand activity did not occur: Safety/medical concerns (bilateral AKA, pain)      Chair/bed transfer   Chair/bed transfer assist level: 2 Armed forces training and education officer transfer activity did not occur: Safety/medical concerns Assist Level: 2 Helpers     Care Tool Cognition  Expression of Ideas and Wants Expression of Ideas and Wants: 4. Without difficulty (complex and basic) - expresses complex messages without difficulty and with speech that is clear and easy to understand  Understanding Verbal and Non-Verbal Content Understanding Verbal and Non-Verbal  Content: 4. Understands (complex and basic) - clear comprehension without cues or repetitions   Memory/Recall Ability Memory/Recall Ability : Current season;Location of own room;Staff names and faces;That he or she is in a hospital/hospital unit   Refer to Care Plan for [place] 1 OT Short Term Goal 1 (Week 1): Patient will increased LB dressing at bed level to Max A. OT Short Term Goal 2 (Week 1): Patient will demonstrate improved functional performance during toilet transfers utilizing most appropriate technique with Mod A x1.  Recommendations for other services: Therapeutic Recreation  Outing/community reintegration   Skilled Therapeutic Intervention ADL ADL Eating: Set up Where Assessed-Eating: Bed level Grooming: Setup Where Assessed-Grooming: Bed level Upper Body Bathing: Minimal assistance Where Assessed-Upper Body Bathing: Bed level Lower Body Bathing: Moderate assistance Where Assessed-Lower Body Bathing: Bed level Upper Body Dressing: Minimal assistance Where Assessed-Upper Body Dressing: Bed level Lower Body Dressing: Dependent Where Assessed-Lower Body Dressing: Bed level Toileting: Dependent Where Assessed-Toileting: Bed level Toilet Transfer: Moderate assistance (2 person assist) Toilet Transfer Method: Other (comment) (AP transfer) Toilet Transfer Equipment: Bedside commode Tub/Shower Transfer: Unable to assess Social research officer, government: Unable to assess Mobility  Bed Mobility Bed Mobility: Supine to Sit;Rolling Left;Rolling Right Rolling Right: Maximal Assistance - Patient 25-49% Rolling Left: Moderate Assistance - Patient 50-74% Supine to Sit: Total Assistance - Patient < 25%    Discharge Criteria: Patient will be discharged from OT if patient refuses treatment 3 consecutive times without medical reason, if treatment goals not met, if there is a change in medical status, if patient makes no progress towards goals or if patient is  discharged from hospital.  The above assessment, treatment plan, treatment alternatives and goals were discussed and mutually agreed upon: by patient  Ailene Ravel, OTR/L,CBIS  Supplemental OT - [place] and [facility]  08/14/2022, 1:16 PM

## 2022-08-14 NOTE — Progress Notes (Signed)
PROGRESS NOTE   Subjective/Complaints: Hypotensive, d/c Xanax C/o residual limb pain this morning Discussed that CBGs are slightly elevated +pruritus of back  ROS: +pruritus of back   Objective:   No results found. Recent Labs    08/13/22 0919  WBC 13.2*  HGB 9.5*  HCT 31.2*  PLT 217   Recent Labs    08/13/22 0919  NA 138  K 4.5  CL 96*  CO2 20*  GLUCOSE 145*  BUN 78*  CREATININE 7.96*  CALCIUM 8.7*   No intake or output data in the 24 hours ending 08/14/22 1139      Physical Exam: Vital Signs Blood pressure 98/67, pulse 89, temperature 98 F (36.7 C), temperature source Oral, resp. rate 17, height 3' 11.24" (1.2 m), weight 73.7 kg, SpO2 98 %. Gen: no distress, normal appearing HEENT: oral mucosa pink and moist, NCAT Cardio: Reg rate Chest: normal effort, normal rate of breathing Abd: soft, non-distended Ext: no edema Psych: pleasant, normal affect Skin: wound vac removed Neuro: Alert and oriented to person and place.  Knows December but does not know year or day.  Follows commands.  Cranial nerves II through XII intact. Sensation to light touch intact in all 4 extremities Strength 5 out of 5 bilateral shoulder abduction, elbow flexion and extension Able to lift bilateral lower extremities Musculoskeletal: Patient with multiple finger amputations.  He has amputations of right second, third and fifth digits at MCP and fourth digit PIP.  He has amputations of the left second and fourth digits.  Left third and fifth digits with flexion contractures.  Right AKA is well-healed.  Left AKA with wound VAC dressing in place draining serosanguineous fluid.  Appropriately tender  No joint swelling noted   Right IJ TDC Right upper extremity IV   Assessment/Plan: 1. Functional deficits which require 3+ hours per day of interdisciplinary therapy in a comprehensive inpatient rehab setting. Physiatrist is  providing close team supervision and 24 hour management of active medical problems listed below. Physiatrist and rehab team continue to assess barriers to discharge/monitor patient progress toward functional and medical goals  Care Tool:  Bathing              Bathing assist       Upper Body Dressing/Undressing Upper body dressing        Upper body assist      Lower Body Dressing/Undressing Lower body dressing            Lower body assist       Toileting Toileting    Toileting assist       Transfers Chair/bed transfer  Transfers assist     Chair/bed transfer assist level: 2 Helpers (PA transfer)     Locomotion Ambulation   Ambulation assist   Ambulation activity did not occur: Safety/medical concerns (bilateral AKA, pain)          Walk 10 feet activity   Assist  Walk 10 feet activity did not occur: Safety/medical concerns (bilateral AKA, pain)        Walk 50 feet activity   Assist Walk 50 feet with 2 turns activity did not occur: Safety/medical concerns (bilateral AKA, pain)  Walk 150 feet activity   Assist Walk 150 feet activity did not occur: Safety/medical concerns (bilateral AKA, pain)         Walk 10 feet on uneven surface  activity   Assist Walk 10 feet on uneven surfaces activity did not occur: Safety/medical concerns (bilateral AKA, pain)         Wheelchair     Assist Is the patient using a wheelchair?: Yes Type of Wheelchair: Manual Wheelchair activity did not occur: Safety/medical concerns (multiple digit amputations, bilateral AKA)         Wheelchair 50 feet with 2 turns activity    Assist    Wheelchair 50 feet with 2 turns activity did not occur: Safety/medical concerns (multiple digit amputations, bilateral AKA)       Wheelchair 150 feet activity     Assist  Wheelchair 150 feet activity did not occur: Safety/medical concerns (multiple digit amputations, bilateral AKA)        Blood pressure 98/67, pulse 89, temperature 98 F (36.7 C), temperature source Oral, resp. rate 17, height 3' 11.24" (1.2 m), weight 73.7 kg, SpO2 98 %.  Medical Problem List and Plan: 1. Functional deficits secondary to left AKA 08/05/2022 due to gangrenous changes.  Status post wound VAC.  History of right TKA 01/08/2022             -patient may shower             -ELOS/Goals: 12-14 days , PT/OT min assist              -Initial CIR evals today 2.  Antithrombotics: -DVT/anticoagulation:  Pharmaceutical: Heparin             -antiplatelet therapy: Aspirin 81 mg daily and Plavix 75 mg daily 3. Pain Management: Neurontin 100 mg 3 times daily, oxycodone as needed 4. Anxiety: Xanax d/ced given hypotension 5. Neuropsych/cognition: This patient is capable of making decisions on his own behalf. 6. Residual limb incision: remove wound vac 7. Fluids/Electrolytes/Nutrition: Routine in and outs with follow-up chemistries 8.  End-stage renal disease.  Hemodialysis as directed per Nephrology. TTS schedule 9.  Acute on chronic anemia. Last HGB 9.5.  Follow-up CBC 10.  Diabetes mellitus.  Hemoglobin A1c 6.3.  Tradjenta 5 mg daily/SSI. CBGs well controlled 11.  Chronic orthostasis.  ProAmatine 10 mg 3 times daily.  Monitor with increased mobility. Monitor fluid intake. 12.  Constipation.  Linzess daily and MiraLAX as needed. LBM 12/26 13.  GERD.  Protonix 14.  Chronic combined CHF.  Ejection fraction 45%.  Monitor for any signs of fluid overload. Daily weights 15. Hyperkalemia. Resolved. Continue HD per nephrology. 16. Acute transaminitis and cholelithiasis. monitor LFTs. 17. Hypotension: d/c xanax 18. Pruritus: hydrocortisone ordered  LOS: 1 days A FACE TO FACE EVALUATION WAS PERFORMED  Clide Deutscher Delray Reza 08/14/2022, 11:39 AM

## 2022-08-14 NOTE — Progress Notes (Signed)
Inpatient Rehabilitation  Patient information reviewed and entered into eRehab system by Jahrel Borthwick Arneda Sappington, OTR/L, Rehab Quality Coordinator.   Information including medical coding, functional ability and quality indicators will be reviewed and updated through discharge.   

## 2022-08-14 NOTE — Patient Care Conference (Signed)
Inpatient RehabilitationTeam Conference and Plan of Care Update Date: 08/14/2022   Time: 11:35 AM    Patient Name: Edward [name]      Medical Record Number: [id number]  Date of Birth: 06-Jul-1971 Sex: Male         Room/Bed: 4W25C/4W25C-01 Payor Info: Payor: [employer] MEDICARE / Plan: [facility] MEDICARE / Product Type: *No Product type* /    Admit Date/Time:  08/13/2022  6:02 PM  Primary Diagnosis:  S/P bilateral above knee amputation [facility])  Hospital Problems: Principal Problem:   S/P bilateral above knee amputation [facility])    Expected Discharge Date: Expected Discharge Date: 08/23/22  Team Members Present: Physician leading conference: Dr. Leeroy Cha Social Worker Present: Erlene Quan, BSW Nurse Present: Dorien Chihuahua, RN PT Present: Becky Sax, PT OT Present: Willeen Cass, OT SLP Present: Helaine Chess, SLP PPS Coordinator present : Gunnar Fusi, SLP     Current Status/Progress Goal Weekly Team Focus  Bowel/Bladder   Incontinent bowel due to iquid stools   Bowel incontinemce will resolve   Assess Q/shift    Swallow/Nutrition/ Hydration               ADL's                Mobility   bed mobility max A, PA transfers min/mod A +2 using chuck pad   CGA transfers, supervision bed mobility and WC mobility  functional mobility/transfers, generalized strengthening and endurance, equipment management, D/C planning, and caregiver education    Communication                Safety/Cognition/ Behavioral Observations               Pain   occasional pain rle aka   willl maintain pain below 3/10   Assess q4 and PRN    Skin   pressure points inatct   Pressure points will remain intact  Assess q shift      Discharge Planning:  New admission, assesment pending   Team Discussion: Patient post left AKA; old right AKA with prosthesis.  Patient on target to meet rehab goals: yes, currently needs mod - max assist with bed mobility and min  - mod assist for anterior /posterior transfers.   *See Care Plan and progress notes for long and short-term goals.   Revisions to Treatment Plan:  Power chair assessment  Practice transfers with prosthetic   Teaching Needs: Safety, medications, skin care, dietary modifications, transfers, toileting, etc.  Current Barriers to Discharge: Decreased caregiver support, Home enviroment access/layout, and Hemodialysis  Possible Resolutions to Barriers: Family education Hh follow up services DME: [facility]     Medical Summary Current Status: obesity, residual limb pain, multiple finger amputations, elevated CBGs, rash on back with pruritis  Barriers to Discharge: Medical stability  Barriers to Discharge Comments: obesity, residual limb pain, multiple finger amputations, elevated CBGs, rash on back with pruritis Possible Resolutions to [employer] Focus: provided dietary education, hydrotherpay ordered for finger amputation, continue to monitor CBGs, hydrocortisone ordered, d/c prosource given elevated CBGs   Continued Need for Acute Rehabilitation Level of Care: The patient requires daily medical management by a physician with specialized training in physical medicine and rehabilitation for the following reasons: Direction of a multidisciplinary physical rehabilitation program to maximize functional independence : Yes Medical management of patient stability for increased activity during participation in an intensive rehabilitation regime.: Yes Analysis of laboratory values and/or radiology reports with any subsequent need for medication adjustment and/or  medical intervention. : Yes   I attest that I was present, lead the team conference, and concur with the assessment and plan of the team.   Dorien Chihuahua B 08/14/2022, 1:50 PM

## 2022-08-15 DIAGNOSIS — Z89611 Acquired absence of right leg above knee: Secondary | ICD-10-CM | POA: Diagnosis not present

## 2022-08-15 DIAGNOSIS — Z89612 Acquired absence of left leg above knee: Secondary | ICD-10-CM | POA: Diagnosis not present

## 2022-08-15 LAB — GLUCOSE, CAPILLARY
Glucose-Capillary: 143 mg/dL — ABNORMAL HIGH (ref 70–99)
Glucose-Capillary: 131 mg/dL — ABNORMAL HIGH (ref 70–99)
Glucose-Capillary: 107 mg/dL — ABNORMAL HIGH (ref 70–99)
Glucose-Capillary: 189 mg/dL — ABNORMAL HIGH (ref 70–99)

## 2022-08-15 MED ORDER — NEPRO/CARBSTEADY PO LIQD: 237.0000 mL | ORAL | Status: DC | PRN

## 2022-08-15 MED ORDER — OXYCODONE HCL 5 MG PO TABS
5.0000 mg | ORAL_TABLET | Freq: Three times a day (TID) | ORAL | Status: DC | PRN
  Administered 2022-08-16 – 2022-08-20 (×6): 5 mg via ORAL
  Filled 2022-08-15 (×6): qty 1

## 2022-08-15 MED ORDER — ALBUMIN HUMAN 25 % IV SOLN
INTRAVENOUS | Status: AC
  Filled 2022-08-15: qty 100

## 2022-08-15 MED ORDER — HEPARIN SODIUM (PORCINE) 1000 UNIT/ML IJ SOLN
INTRAMUSCULAR | Status: AC
  Filled 2022-08-15: qty 4

## 2022-08-15 MED ORDER — PENTAFLUOROPROP-TETRAFLUOROETH EX AERO: 1.0000 | INHALATION_SPRAY | CUTANEOUS | Status: DC | PRN

## 2022-08-15 MED ORDER — MIDODRINE HCL 5 MG PO TABS
10.0000 mg | ORAL_TABLET | Freq: Once | ORAL | Status: AC
  Administered 2022-08-15: 10 mg via ORAL

## 2022-08-15 MED ORDER — LIDOCAINE HCL (PF) 1 % IJ SOLN: 5.0000 mL | INTRAMUSCULAR | Status: DC | PRN

## 2022-08-15 MED ORDER — ALBUMIN HUMAN 25 % IV SOLN
25.0000 g | Freq: Once | INTRAVENOUS | Status: AC
  Administered 2022-08-15: 25 g via INTRAVENOUS
  Filled 2022-08-15: qty 100

## 2022-08-15 MED ORDER — LIDOCAINE-PRILOCAINE 2.5-2.5 % EX CREA: 1.0000 | TOPICAL_CREAM | CUTANEOUS | Status: DC | PRN

## 2022-08-15 MED ORDER — B COMPLEX-C PO TABS
1.0000 | ORAL_TABLET | Freq: Every day | ORAL | Status: DC
  Administered 2022-08-16 – 2022-08-23 (×7): 1 via ORAL
  Filled 2022-08-15 (×7): qty 1

## 2022-08-15 NOTE — Progress Notes (Addendum)
[facility] Progress Note   Subjective: Called prior to starting HD D/T hypotension. Midodrine and albumin ordered. Pt asymptomatic.     Objective Vitals:   08/15/22 1200 08/15/22 1340 08/15/22 1410 08/15/22 1430  BP: 91/66 (!) 88/67 91/67 (!) 88/63  Pulse: 95 91 92 90  Resp:  14 15 (!) 37  Temp:  97.7 F (36.5 C)    TempSrc:  Temporal    SpO2:  96% 94% 90%  Weight:      Height:       Physical Exam General: Chronically ill appearing male in  NAD  Heart: S1,S2 no M/R/G Lungs: CTAB A/P Abdomen: soft, NABS Extremities: L AKA, old R AKA intact w/o edema. Sp amputated fingers both hands.  Dialysis Access: RIJ TDC drsg intact.   Additional Objective Labs: Basic Metabolic Panel: Recent Labs  Lab 08/09/22 0315 08/09/22 1030 08/10/22 0240 08/13/22 0919  NA 137  --  135 138  K 4.5  --  4.5 4.5  CL 97*  --  95* 96*  CO2 22  --  21* 20*  GLUCOSE 156*  --  191* 145*  BUN 30*  --  62* 78*  CREATININE 4.72*  --  6.29* 7.96*  CALCIUM 8.7*  --  8.7* 8.7*  PHOS  --  4.9*  --  5.7*   Liver Function Tests: Recent Labs  Lab 08/09/22 0315 08/10/22 0240 08/13/22 0919  AST 48* 30  --   ALT 59* 28  --   ALKPHOS 102 111  --   BILITOT 1.1 1.5*  --   PROT 6.4* 6.8  --   ALBUMIN 2.5* 2.6* 2.5*   No results for input(s): "LIPASE", "AMYLASE" in the last 168 hours. CBC: Recent Labs  Lab 08/09/22 0315 08/10/22 0240 08/10/22 0858 08/13/22 0919  WBC 11.6* 9.7 8.1 13.2*  NEUTROABS 9.2* 7.4  --   --   HGB 8.9* 9.6* 8.7* 9.5*  HCT 28.1* 29.9* 27.4* 31.2*  MCV 94.9 93.7 93.5 96.0  PLT 154 169 184 217   Blood Culture    Component Value Date/Time   SDES BLOOD RIGHT ARM 08/05/2022 2105   SDES BLOOD RIGHT ARM 08/05/2022 2105   SPECREQUEST  08/05/2022 2105    BOTTLES DRAWN AEROBIC AND ANAEROBIC Blood Culture adequate volume   SPECREQUEST  08/05/2022 2105    BOTTLES DRAWN AEROBIC AND ANAEROBIC Blood Culture adequate volume   CULT  08/05/2022 2105    NO GROWTH 5  DAYS Performed at [facility], [place] [address], [place], [place] [address]    CULT  08/05/2022 2105    NO GROWTH 5 DAYS Performed at [facility] [address] [place], [place] [address]    REPTSTATUS 08/10/2022 FINAL 08/05/2022 2105   REPTSTATUS 08/10/2022 FINAL 08/05/2022 2105    Cardiac Enzymes: No results for input(s): "CKTOTAL", "CKMB", "CKMBINDEX", "TROPONINI" in the last 168 hours. CBG: Recent Labs  Lab 08/14/22 1159 08/14/22 1702 08/14/22 2041 08/15/22 0544 08/15/22 1154  GLUCAP 157* 120* 143* 143* 131*   Iron Studies: No results for input(s): "IRON", "TIBC", "TRANSFERRIN", "FERRITIN" in the last 72 hours. '@lablastinr3'$ @ Studies/Results: No results found. Medications:   vitamin C  1,000 mg Oral Daily   aspirin EC  81 mg Oral Daily   B-complex with vitamin C  1 tablet Oral Daily   Chlorhexidine Gluconate Cloth  6 each Topical Q12H   clopidogrel  75 mg Oral Daily   gabapentin  100 mg Oral TID  heparin  5,000 Units Subcutaneous Q12H   hydrocortisone cream   Topical BID   insulin aspart  0-9 Units Subcutaneous TID WC   linaclotide  72 mcg Oral q AM   linagliptin  5 mg Oral Daily   midodrine  10 mg Oral TID WC   nutrition supplement (JUVEN)  1 packet Oral BID BM   pantoprazole  40 mg Oral Daily   sodium bicarbonate  650 mg Oral BID   sucroferric oxyhydroxide  1,000 mg Oral BID WC   zinc sulfate  220 mg Oral Daily     [facility] HD: [facility]  4.5h  425/ 700   89kg  2/2 bath  TDC  Hep 8000+ 2045md - mircera 1527m q2, last 12/16, due 12/30 - venofer '50mg'$  IV weekly - hectorol 1077mIV TIW - korsuva for itching  - velphoro 3 tabs with meals- last phos 12.5     Assessment/Plan:  L foot wound - S/P L AKA per Dr. DudSharol Given/20. Now in [facility]C for rehab. ESRD -  TTS HD. OP Heparin dose too high for current wt. Lower dose on discharge. Next HD 08/17/2022. Hypotension/volume: BP remains soft, has hx of hypotension at OP Clinic as well. Pain medication frequency  decreased 08/14/2022. Will decrease frequency again today to q 8 hours. Started midodrine here at 10 mg tid. Euvolemic and several kg under EDW. Will need lower EDW on discharge post AKA. No Antihypertensive meds.  Anemia: Hgb 9.5. ESA due 08/17/2022.   Metabolic bone disease: Calcium elevated, holding hectorol for now.  Resumed binders. PO4 5.7   Nutrition:  Renal diet. Very low albumin. Add protein supps.  Abnormal LFT's - RUQ US [place]owed cholelithiasis and gallbladder wall thickening.  Negative US [place] sign, acute hepatitis panel negative.  Per primary.  Bradycardia: Resolved Hyperkalemia:Resolved.    Brandley Aldrete H. Nyara Capell NP-C 08/15/2022, 2:44 PM  [id number] [phone]

## 2022-08-15 NOTE — Progress Notes (Addendum)
Patient ID: Edward [name], male   DOB: 09/02/1970, 51 y.o.   MRN: [id number]  [facility] referral sent to [facility] Patient approved for PT OT SOC: 1/6

## 2022-08-15 NOTE — Progress Notes (Signed)
Pt arrived in hd with alert and oriented. B/p 88/56. Spoke with provider and received order for additional '10mg'$  of Midodrine po and 25gms of albumin. Both medications on board last b/p 91/67. Hd tx started. Will continue to monitor.

## 2022-08-15 NOTE — Progress Notes (Signed)
   SW spoke with patient son, Fannie Knee, introduced self and explained role. Sw and son discussed patient potentially getting a power wheelchair and evaluation appointment. Son informed patient will require a ramp and he potentially knows someone who can assist with the ramp. Son informed of current discharge date. No additional questions or concerns. Sw will follow up with patient and son with conference updates next week.

## 2022-08-15 NOTE — Progress Notes (Signed)
PROGRESS NOTE   Subjective/Complaints: Sleepy this morning Patient's chart reviewed- No issues reported overnight Hypotensive  ROS: +pruritus of back, +residual limb pain   Objective:   No results found. Recent Labs    08/13/22 0919  WBC 13.2*  HGB 9.5*  HCT 31.2*  PLT 217   Recent Labs    08/13/22 0919  NA 138  K 4.5  CL 96*  CO2 20*  GLUCOSE 145*  BUN 78*  CREATININE 7.96*  CALCIUM 8.7*    Intake/Output Summary (Last 24 hours) at 08/15/2022 1112 Last data filed at 08/15/2022 0942 Gross per 24 hour  Intake 417 ml  Output --  Net 417 ml        Physical Exam: Vital Signs Blood pressure (!) 99/55, pulse 84, temperature 97.8 F (36.6 C), temperature source Oral, resp. rate 17, height 3' 11.24" (1.2 m), weight 73.7 kg, SpO2 98 %. Gen: no distress, normal appearing HEENT: oral mucosa pink and moist, NCAT Cardio: Reg rate Chest: normal effort, normal rate of breathing Abd: soft, non-distended Ext: no edema Psych: pleasant, normal affect Skin: wound vac removed, amputated fingers Neuro: Alert and oriented to person and place.  Knows December but does not know year or day.  Follows commands.  Cranial nerves II through XII intact. Sensation to light touch intact in all 4 extremities Strength 5 out of 5 bilateral shoulder abduction, elbow flexion and extension Able to lift bilateral lower extremities Musculoskeletal: Patient with multiple finger amputations.  He has amputations of right second, third and fifth digits at MCP and fourth digit PIP.  He has amputations of the left second and fourth digits.  Left third and fifth digits with flexion contractures.  Right AKA is well-healed.  Left AKA with wound VAC dressing in place draining serosanguineous fluid.  Appropriately tender  No joint swelling noted   Right IJ TDC Right upper extremity IV   Assessment/Plan: 1. Functional deficits which require 3+  hours per day of interdisciplinary therapy in a comprehensive inpatient rehab setting. Physiatrist is providing close team supervision and 24 hour management of active medical problems listed below. Physiatrist and rehab team continue to assess barriers to discharge/monitor patient progress toward functional and medical goals  Care Tool:  Bathing    Body parts bathed by patient: Right arm, Left arm, Chest, Abdomen, Front perineal area, Face   Body parts bathed by helper: Buttocks, Right upper leg, Left upper leg Body parts n/a: Left lower leg, Right lower leg   Bathing assist Assist Level: Moderate Assistance - Patient 50 - 74%     Upper Body Dressing/Undressing Upper body dressing   What is the patient wearing?: Pull over shirt    Upper body assist Assist Level: Minimal Assistance - Patient > 75%    Lower Body Dressing/Undressing Lower body dressing      What is the patient wearing?: Underwear/pull up, Ace wrap/stump shrinker     Lower body assist Assist for lower body dressing: Dependent - Patient 0%     Toileting Toileting Toileting Activity did not occur (Clothing management and hygiene only): N/A (no void or bm)  Toileting assist Assist for toileting: Total Assistance - Patient <  25%     Transfers Chair/bed transfer  Transfers assist  Chair/bed transfer activity did not occur: Safety/medical concerns  Chair/bed transfer assist level: 2 Helpers     Locomotion Ambulation   Ambulation assist   Ambulation activity did not occur: Safety/medical concerns (bilateral AKA, pain)          Walk 10 feet activity   Assist  Walk 10 feet activity did not occur: Safety/medical concerns (bilateral AKA, pain)        Walk 50 feet activity   Assist Walk 50 feet with 2 turns activity did not occur: Safety/medical concerns (bilateral AKA, pain)         Walk 150 feet activity   Assist Walk 150 feet activity did not occur: Safety/medical concerns  (bilateral AKA, pain)         Walk 10 feet on uneven surface  activity   Assist Walk 10 feet on uneven surfaces activity did not occur: Safety/medical concerns (bilateral AKA, pain)         Wheelchair     Assist Is the patient using a wheelchair?: Yes Type of Wheelchair: Manual Wheelchair activity did not occur: Safety/medical concerns (multiple digit amputations, bilateral AKA)         Wheelchair 50 feet with 2 turns activity    Assist    Wheelchair 50 feet with 2 turns activity did not occur: Safety/medical concerns (multiple digit amputations, bilateral AKA)       Wheelchair 150 feet activity     Assist  Wheelchair 150 feet activity did not occur: Safety/medical concerns (multiple digit amputations, bilateral AKA)       Blood pressure (!) 99/55, pulse 84, temperature 97.8 F (36.6 C), temperature source Oral, resp. rate 17, height 3' 11.24" (1.2 m), weight 73.7 kg, SpO2 98 %.  Medical Problem List and Plan: 1. Functional deficits secondary to left AKA 08/05/2022 due to gangrenous changes.  Status post wound VAC.  History of right TKA 01/08/2022             -patient may shower             -ELOS/Goals: 12-14 days , PT/OT min assist              Continue CIR 2. Impaired mobility: order power wheelchair -DVT/anticoagulation:  Pharmaceutical: Heparin             -antiplatelet therapy: Aspirin 81 mg daily and Plavix 75 mg daily 3. Pain Management: Neurontin 100 mg 3 times daily, oxycodone as needed. Add vitamin C 4. Anxiety: Xanax d/ced given hypotension 5. Neuropsych/cognition: This patient is capable of making decisions on his own behalf. 6. Residual limb incision: remove wound vac 7. Fluids/Electrolytes/Nutrition: Routine in and outs with follow-up chemistries 8.  End-stage renal disease.  Hemodialysis as directed per Nephrology. TTS schedule 9.  Acute on chronic anemia. Last HGB 9.5.  Follow-up CBC 10.  Diabetes mellitus.  Hemoglobin A1c 6.3.   Tradjenta 5 mg daily/SSI. CBGs well controlled 11.  Chronic orthostasis.  ProAmatine 10 mg 3 times daily.  Monitor with increased mobility. Monitor fluid intake. 12.  Constipation.  Linzess daily and MiraLAX as needed. LBM 12/26 13.  GERD.  Protonix 14.  Chronic combined CHF.  Ejection fraction 45%.  Monitor for any signs of fluid overload. Daily weights 15. Hyperkalemia. Resolved. Continue HD per nephrology. 16. Acute transaminitis and cholelithiasis. monitor LFTs. 17. Hypotension: d/c xanax 18. Pruritus: hydrocortisone ordered 19. Loose stool: d/c nepro  LOS: 2 days A FACE  TO FACE EVALUATION WAS PERFORMED  Martha Clan P Lui Bellis 08/15/2022, 11:12 AM

## 2022-08-15 NOTE — Progress Notes (Signed)
Inpatient Rehabilitation Care Coordinator Assessment and Plan Patient Details  Name: Edward [name] MRN: [id number] Date of Birth: 1971-05-06  Today's Date: 08/15/2022  Hospital Problems: Principal Problem:   S/P bilateral above knee amputation [facility])  Past Medical History:  Past Medical History:  Diagnosis Date   Allergy    Anemia    getting Venofer with dialysis   Anxiety    self reported, sometimes-situational anxiety   Arthritis    bilateral hands/LEFT hip   CHF (congestive heart failure) ([place])    Chronic kidney disease    t th s dialysis- [place] ---ESRD   Coronary artery disease    Depression    self reported, sometimes-situational depression   Diabetes mellitus    type II- on meds   Dyspnea    sometimes- fluid   GERD (gastroesophageal reflux disease)    on meds   History of blood transfusion    Hypertension    no longer on medication since starting on Hemodialysis   Myocardial infarction ([place]) 2019   Peripheral vascular disease ([place])    Sleep apnea    does not use CPAP   Past Surgical History:  Past Surgical History:  Procedure Laterality Date   A/V FISTULAGRAM Left 03/06/2020   Procedure: A/V FISTULAGRAM;  Surgeon: Waynetta Sandy, MD;  Location: [place] [facility];  Service: Cardiovascular;  Laterality: Left;   ABDOMINAL AORTOGRAM N/A 01/07/2022   Procedure: ABDOMINAL AORTOGRAM;  Surgeon: Waynetta Sandy, MD;  Location: [place] [facility];  Service: Cardiovascular;  Laterality: N/A;   ABDOMINAL AORTOGRAM W/LOWER EXTREMITY N/A 04/09/2021   Procedure: ABDOMINAL AORTOGRAM W/LOWER EXTREMITY;  Surgeon: Waynetta Sandy, MD;  Location: [place] [facility];  Service: Cardiovascular;  Laterality: N/A;   AMPUTATION Left 05/10/2016   Procedure: Left Transmetatarsal Amputation;  Surgeon: [name], MD;  Location: [place];  Service: Orthopedics;  Laterality: Left;   AMPUTATION Right 05/16/2018   Procedure: PARTIAL RAY AMPUTATION FIFTH TOE  RIGHT FOOT;  Surgeon: [name], DPM;  Location: [place];  Service: Podiatry;  Laterality: Right;   AMPUTATION Right 06/17/2018   Procedure: AMPUTATION 4th RAY Right Foot;  Surgeon: Trula Slade, DPM;  Location: [place];  Service: Podiatry;  Laterality: Right;   AMPUTATION Right 06/19/2018   Procedure: RIGHT BELOW KNEE AMPUTATION;  Surgeon: [name], MD;  Location: [place];  Service: Orthopedics;  Laterality: Right;   AMPUTATION Left 10/05/2021   Procedure: LEFT RING FINGER AMPUTATION;  Surgeon: Daryll Brod, MD;  Location: [place];  Service: Orthopedics;  Laterality: Left;  45 MIN   AMPUTATION Left 11/15/2021   Procedure: LEFT INDEX FINGER AMPUTATION;  Surgeon: Leanora Cover, MD;  Location: [place];  Service: Orthopedics;  Laterality: Left;  60 MIN   AMPUTATION Right 01/08/2022   Procedure: AMPUTATION ABOVE KNEE;  Surgeon: Waynetta Sandy, MD;  Location: [place];  Service: Vascular;  Laterality: Right;   AMPUTATION Bilateral 01/12/2022   Procedure: LEFT INDEX FINGER AMPUTATION, RIGHT INDEX, LONG AND SMALL DIGITS AMPUTATION;  Surgeon: Leanora Cover, MD;  Location: [place];  Service: Orthopedics;  Laterality: Bilateral;   AMPUTATION Right 04/01/2022   Procedure: RIGHT INDEX FINGER AMPUTATION AT METACARPAL PHALANGEAL JOINT, INCISION AND DRAINAGE RIGHT HAND;  Surgeon: Leanora Cover, MD;  Location: [place];  Service: Orthopedics;  Laterality: Right;  90 MIN   AMPUTATION Right 05/23/2022   Procedure: RIGHT INDEX FINGER RAY AMPUTATION; RIGHT LONG, RING, AND SMALL FINGER AMPUTATIONS;  Surgeon: Leanora Cover, MD;  Location: [place];  Service: Orthopedics;  Laterality: Right;  90 MIN   AMPUTATION Left 08/07/2022   Procedure: LEFT ABOVE KNEE AMPUTATION;  Surgeon: [name], MD;  Location: [place];  Service: Orthopedics;  Laterality: Left;   AORTIC ARCH ANGIOGRAPHY N/A 01/07/2022   Procedure: AORTIC ARCH ANGIOGRAPHY;  Surgeon: Waynetta Sandy, MD;  Location: [place] [facility];  Service:  Cardiovascular;  Laterality: N/A;   APPLICATION OF WOUND VAC Right 06/19/2018   Procedure: APPLICATION OF WOUND VAC;  Surgeon: [name], MD;  Location: [place];  Service: Orthopedics;  Laterality: Right;   APPLICATION OF WOUND VAC Left 08/07/2022   Procedure: APPLICATION OF WOUND VAC;  Surgeon: [name], MD;  Location: [place];  Service: Orthopedics;  Laterality: Left;   AV FISTULA PLACEMENT Left 11/25/2019   Procedure: LEFT ARM Brachiocephalic  ARTERIOVENOUS (AV) FISTULA CREATION;  Surgeon: Rosetta Posner, MD;  Location: [place];  Service: Vascular;  Laterality: Left;   [place] Left 07/19/2020   Procedure: LEFT SECOND STAGE [place];  Surgeon: Waynetta Sandy, MD;  Location: [place];  Service: Vascular;  Laterality: Left;   CIRCUMCISION  09/02/2011   Procedure: CIRCUMCISION ADULT;  Surgeon: Molli Hazard, MD;  Location: [place];  Service: Urology;  Laterality: N/A;   CORONARY STENT INTERVENTION N/A 07/29/2018   Procedure: CORONARY STENT INTERVENTION;  Surgeon: Leonie Man, MD;  Location: [place] [facility];  Service: Cardiovascular;  Laterality: N/A;   DEBRIDEMENT AND CLOSURE WOUND Left 11/15/2021   Procedure: LEFT HAND WOUND CLOSURE;  Surgeon: Leanora Cover, MD;  Location: [place];  Service: Orthopedics;  Laterality: Left;   I & D EXTREMITY  09/02/2011   Procedure: IRRIGATION AND DEBRIDEMENT EXTREMITY;  Surgeon: Theodoro Kos, DO;  Location: [place];  Service: Plastics;  Laterality: N/A;   INCISION AND DRAINAGE OF WOUND  08/12/2011   Procedure: IRRIGATION AND DEBRIDEMENT WOUND;  Surgeon: Zenovia Jarred, MD;  Location: [place];  Service: General;;  perineum   INSERTION OF DIALYSIS CATHETER N/A 11/25/2019   Procedure: INSERTION OF Righ Internal Jugular DIALYSIS CATHETER;  Surgeon: Rosetta Posner, MD;  Location: [place];  Service: Vascular;  Laterality: N/A;   [place]   08/12/2011   Procedure: IRRIGATION AND DEBRIDEMENT ABSCESS;  Surgeon: Molli Hazard, MD;  Location: [place];  Service: Urology;  Laterality: N/A;  irrigation and debridment perineum     IRRIGATION AND DEBRIDEMENT ABSCESS  08/14/2011   Procedure: MINOR INCISION AND DRAINAGE OF ABSCESS;  Surgeon: Molli Hazard, MD;  Location: [place];  Service: Urology;  Laterality: N/A;  Perineal Wound Debridement;Placement Bilateral Testicular Thigh Pouches   LEFT HEART CATH AND CORONARY ANGIOGRAPHY N/A 07/29/2018   Procedure: LEFT HEART CATH AND CORONARY ANGIOGRAPHY;  Surgeon: Leonie Man, MD;  Location: [place] [facility];  Service: Cardiovascular;  Laterality: N/A;   LOWER EXTREMITY ANGIOGRAPHY Bilateral 01/07/2022   Procedure: Lower Extremity Angiography;  Surgeon: Waynetta Sandy, MD;  Location: [place] [facility];  Service: Cardiovascular;  Laterality: Bilateral;   PERIPHERAL VASCULAR ATHERECTOMY Left 04/09/2021   Procedure: PERIPHERAL VASCULAR ATHERECTOMY;  Surgeon: Waynetta Sandy, MD;  Location: [place] [facility];  Service: Cardiovascular;  Laterality: Left;  TP trunk and peroneal arteries   PERIPHERAL VASCULAR BALLOON ANGIOPLASTY Left 04/09/2021   Procedure: PERIPHERAL VASCULAR BALLOON ANGIOPLASTY;  Surgeon: Waynetta Sandy, MD;  Location: [place] [facility];  Service:  Cardiovascular;  Laterality: Left;  Popliteal   REVISON OF ARTERIOVENOUS FISTULA Left 05/24/2020   Procedure: LEFT ARM ARTERIOVENOUS FISTULA  CONVERSION TO BASILIC VEIN FISTULA;  Surgeon: Waynetta Sandy, MD;  Location: [place];  Service: Vascular;  Laterality: Left;   TOTAL HIP ARTHROPLASTY Left 10/11/2016   Procedure: LEFT TOTAL HIP ARTHROPLASTY ANTERIOR APPROACH;  Surgeon: Mcarthur Rossetti, MD;  Location: [facility];  Service: Orthopedics;  Laterality: Left;   UPPER EXTREMITY ANGIOGRAPHY Bilateral 01/07/2022   Procedure: Upper Extremity Angiography;  Surgeon: Waynetta Sandy, MD;  Location: [place] [facility];  Service: Cardiovascular;  Laterality: Bilateral;   Social History:  reports that he quit smoking about 2 years ago. His smoking use included cigarettes. He has a 6.00 pack-year smoking history. He has never used smokeless tobacco. He reports that he does not currently use alcohol. He reports that he does not use drugs.  Family / Support Systems Children: Jamal and Engineer, manufacturing systems Anticipated Caregiver: Jamal and [name] Ability/Limitations of Caregiver: Patient has son working from home and Younger son from [place]. Caregiver Availability: 24/7 Family Dynamics: support from sons and potenially HC assistance  Social History Preferred language: English Religion: Educational psychologist - How often do you need to have someone help you when you read instructions, pamphlets, or other written material from your doctor or pharmacy?: Always Writes: Yes   Abuse/Neglect Abuse/Neglect Assessment Can Be Completed: Yes Physical Abuse: Denies Verbal Abuse: Denies Sexual Abuse: Denies Exploitation of patient/patient's resources: Denies Self-Neglect: Denies  Patient response to: Social Isolation - How often do you feel lonely or isolated from those around you?: Never  Emotional Status Recent Psychosocial Issues: coping Psychiatric History: hx of anxiety and depression  Patient / Family Perceptions, Expectations & Goals Pt/Family understanding of illness & functional limitations: yes Premorbid pt/family roles/activities: Patient limited in the community. Went to HD and appointments Anticipated changes in roles/activities/participation: Anticipating assistance from sons and potentially [place]: None Premorbid Home Care/DME Agencies: Other (Comment) Administrator, Civil Service, [employer], [facility], [facility], [facility]) Is the patient able to respond to transportation needs?: Yes In the past 12 months, has lack of transportation kept you from  medical appointments or from getting medications?: No In the past 12 months, has lack of transportation kept you from meetings, work, or from getting things needed for daily living?: No Resource referrals recommended: Neuropsychology  Discharge Planning Living Arrangements: Children Support Systems: Children Type of Residence: Private residence Insurance Resources: Multimedia programmer (specify), Nurse, mental health (specify name) Sports administrator MEDICARE) Financial Resources: SSD, Family Support Financial Screen Referred: No Living Expenses: Lives with family Money Management: Patient, Family Does the patient have any problems obtaining your medications?: No Home Management: Independent Care Coordinator Barriers to Discharge: Lack of/limited family support, Decreased caregiver support, Inaccessible home environment, Hemodialysis Care Coordinator Anticipated Follow Up Needs: HH/OP Expected length of stay: 12-14 Days  Clinical Impression SW met with patient introduced self and explained role. Patient anticipates discharging home with assistance from sons. One son ones from  home and other son works nights (5p-5a). Patient expressed sons potentially will hire [facility] in addition. Sw will follow up with patient and son. Patient has electric wheelchair evaluation on 1/2.  Sw will discuss the need for a ramp with pt son.   [name] 08/15/2022, 12:53 PM

## 2022-08-15 NOTE — Progress Notes (Signed)
Occupational Therapy Session Note  Patient Details  Name: Edward [name] MRN: [id number] Date of Birth: 1970/12/05  Today's Date: 08/15/2022 OT Individual Time: 0947-1100 OT Individual Time Calculation (min): 73 min    Short Term Goals: Week 1:  OT Short Term Goal 1 (Week 1): Patient will increased LB dressing at bed level to Max A. OT Short Term Goal 2 (Week 1): Patient will demonstrate improved functional performance during toilet transfers utilizing most appropriate technique with Mod A x1.  Skilled Therapeutic Interventions/Progress Updates:    Patient agreeable to participate in OT session. Reports 0/10 pain level.   Patient participated in skilled OT session focusing on ADL re-training and wheelchair management using powered w/c. Therapist educated pt on use of universal cuff to assist with holding toothbrush in order to improve functional independence with task. Toothpaste was placed on brush with set-up of cuff was provided on left hand (placed around thumb and middle finger). Provided cup with handle to practice holding with left hand when drinking due to decreased bilateral grip strength.   As pt's left hand is stronger, therapist removed joystick and controller from right side of pwr wheelchair and placed it on the left side. Pt educated on wheelchair functions including on/off, steering, and tilt feature. Pt is unable to navigate controls independently and will require continued practice. Pt states vision is not good and does not have glasses he wears. White tape placed over double line button to navigate to tilt feature. Pt states he is able to see button. Functional mobility completed in powered wheelchair in room and hallway. Slowest speed selected for safety. Mod assist required to navigate wheelchair.   Pt completed functional transfer lateral scoot to the right from wheelchair to bed with total assist of 2 people using chuck pad to assist. Total A to boost up to  [facility].   Therapy Documentation Precautions:  Precautions Precautions: Fall Precaution Comments: previous R AKA, multiple digit amputations Restrictions Weight Bearing Restrictions: Yes RUE Weight Bearing: Weight bearing as tolerated RLE Weight Bearing: Non weight bearing LLE Weight Bearing: Non weight bearing  Therapy/Group: Individual Therapy  Ailene Ravel, OTR/L,CBIS  Supplemental OT - [place] [facility]  08/15/2022, 7:49 AM

## 2022-08-15 NOTE — Progress Notes (Signed)
Received patient in bed to unit. B/p was low to start. Given Midodrine '10mg'$  Alert and oriented.  Informed consent signed and in chart.   Treatment initiated:1410 Treatment completed: 6116  Patient tolerated well. Catheter dressing  changed.  Transported back to the room  Alert, without acute distress.  Hand-off given to patient's nurse.   Access used: Catheter Access issues: none  Total UF removed: 1.3L Medication(s) given: Midodrine,Albumin Post HD VS: 99/69,97.7,90,24,96% Post HD weight: 76.4kg   Donah [facility]

## 2022-08-15 NOTE — Progress Notes (Signed)
Physical Therapy Session Note  Patient Details  Name: Edward [name] MRN: [id number] Date of Birth: 1971/03/09  Today's Date: 08/15/2022 PT Individual Time: 0730-0830 and 0915-0940 PT Individual Time Calculation (min): 60 min and 25 min   Short Term Goals: Week 1:  PT Short Term Goal 1 (Week 1): pt will perform bed mobility with mod A of 1 PT Short Term Goal 2 (Week 1): pt will transfer bed<>chair with LRAD and min A of 1 PT Short Term Goal 3 (Week 1): pt will particpate in WC propulsion up to 20f  Skilled Therapeutic Interventions/Progress Updates:   Treatment Session 1 Received pt semi-reclined in bed asleep. Upon wakening pt agreeable to PT treatment and denied any pain at rest but reported feeling "extremely sleepy" and eyes heavy throughout session. Session with emphasis on functional mobility/transfers, dressing, and generalized strengthening and endurance. Notified pt of power WC evaluation with BErlene Quanon 1/2 and retrieved power WC with 18x18 Roho cushion for pt to trial. Donned pants in supine with max A and pt rolled L/R with max A to pull pants over hips.   Pt transferred semi-reclined<>long sitting in bed with max A +2 for trunk control. Turned around to face windows in preparation for PA transfer into power WC with max A +2 using chuck pads. Required increased time and max A +2 to perform lateral leans to get chuck pads in optimal position for transfer. Pt able to maintain static sitting balance with as little as CGA, but with multiple lateral LOB, requiring max A for correction. Pt then worked on backwards reciprocal scooting to get hips to EOB with mod A +2 using chuck pads and pt transferred bed<>power WC via PA transfer with max A +2 - pt required hand over hand cues for hand placement on WC armrests to assist with scooting. May try using slideboards to assist with transfer next time to cover gap between bed and power chair. Once in power WC, pt required increased time and max A  +2 using tilt/recline features of WC to remove folded chuck pad and replace for comfort; then +2 assist to scoot hips back in WC. Concluded session with pt sitting in power WC, needs within reach, and seatbelt and setbelt alarm on.   Treatment Session 2 Received pt reclined in power WC asleep. Upon wakening pt agreeable to PT treatment and reported feeling a little sore but denied any true pain. Session with emphasis on power WC education. RN arrived to administer medications and educated pt on joystick features including turning power chair on/off and using lateral aspect of wrist and palm to control joystick. Educated pt on various speed settings with recommendation to stay on "slow" for safety. Pt required assist [facility], then propelled chair ~144f(including 1 turn) with increased time and min A and mod cues for technique to steer in straight line but max A to back WC up next to bed. Concluded session with pt reclined in power WC, needs within reach, and seatbelt and seatbelt alarm on awaiting upcoming OT session.   Therapy Documentation Precautions:  Precautions Precautions: Fall Precaution Comments: previous R AKA, multiple digit amputations Restrictions Weight Bearing Restrictions: Yes RUE Weight Bearing: Weight bearing as tolerated RLE Weight Bearing: Non weight bearing LLE Weight Bearing: Non weight bearing  Therapy/Group: Individual Therapy AnAlfonse AlpersT, DPT  08/15/2022, 7:16 AM

## 2022-08-15 NOTE — Progress Notes (Signed)
Physical Therapy Wound Evaluation Patient Details  Name: Edward [name] MRN: [id number] Date of Birth: 06/29/1971  Today's Date: 08/15/2022 Time: 1100-1120 Time Calculation (min): 20 min  Subjective  Subjective Assessment Subjective: Patient sleepy throughout session Patient and Family Stated Goals: none stated  Pain Score:    Wound Assessment  Wound / Incision (Open or Dehisced) 05/10/22 Non-pressure wound Hand Anterior;Right (Active)     Wound / Incision (Open or Dehisced) 08/15/22 Amputation;Incision - Open Finger (Comment which one) Right right hand 3rd finger MCP amputation with open wound (Active)  Dressing Type Foam - Lift dressing to assess site every shift;Normal saline moist dressing;Barrier Film (skin prep) 08/15/22 1133  Dressing Changed Changed 08/15/22 1133  Dressing Status Clean, Dry, Intact 08/15/22 1133  Dressing Change Frequency Daily 08/15/22 1133  Site / Wound Assessment Pale 08/15/22 1133  % Wound base Red or Granulating 100% 08/15/22 1133  % Wound base Yellow/Fibrinous Exudate 0% 08/15/22 1133  % Wound base Black/Eschar 0% 08/15/22 1133  % Wound base Other/Granulation Tissue (Comment) 0% 08/15/22 1133  Peri-wound Assessment Edema 08/15/22 1133  Wound Length (cm) 0.3 cm 08/15/22 1133  Wound Width (cm) 0.2 cm 08/15/22 1133  Wound Depth (cm) 0.2 cm 08/15/22 1133  Wound Volume (cm^3) 0.01 cm^3 08/15/22 1133  Wound Surface Area (cm^2) 0.06 cm^2 08/15/22 1133  Tunneling (cm) 0 08/15/22 1133  Undermining (cm) 0 08/15/22 1133  Margins Unattached edges (unapproximated) 08/15/22 1133  Closure None 08/15/22 1133  Drainage Amount Scant 08/15/22 1133  Treatment Cleansed;Irrigation;Packing (Saline gauze) 08/15/22 1133        Wound Assessment and Plan  Wound Therapy - Assess/Plan/Recommendations Wound Therapy - Clinical Statement: patient with minimal wound on right 3rd finger s/p amputation.  Wound appears clean and granulating.  Do not feel hydro/PT wound care is  indicated at this time.  Recommend nursing change dressing daily, keeping wound moist.  Communicated this to MD/RN. Wound Therapy - Functional Problem List: open wound Factors Delaying/Impairing Wound Healing: Diabetes Mellitus, Multiple medical problems, Vascular compromise Hydrotherapy Plan: Other (comment) (hydro to sign off) Wound Therapy - Follow Up Recommendations: dressing changes by RN  Wound Therapy Goals- Improve the function of patient's integumentary system by progressing the wound(s) through the phases of wound healing (inflammation - proliferation - remodeling) by:    Goals will be updated until maximal potential achieved or discharge criteria met.  Discharge criteria: when goals achieved, discharge from hospital, MD decision/surgical intervention, no progress towards goals, refusal/missing three consecutive treatments without notification or medical reason.  GP     Charges PT Wound Care Charges $PT Hydrotherapy Dressing: 1 dressing $PT Hydrotherapy Visit: 1 Visit       Shanna Cisco 08/15/2022, 11:45 AM

## 2022-08-16 ENCOUNTER — Inpatient Hospital Stay (HOSPITAL_COMMUNITY): Payer: Medicare Other

## 2022-08-16 DIAGNOSIS — Z89611 Acquired absence of right leg above knee: Secondary | ICD-10-CM | POA: Diagnosis not present

## 2022-08-16 DIAGNOSIS — Z89612 Acquired absence of left leg above knee: Secondary | ICD-10-CM | POA: Diagnosis not present

## 2022-08-16 LAB — GLUCOSE, CAPILLARY
Glucose-Capillary: 172 mg/dL — ABNORMAL HIGH (ref 70–99)
Glucose-Capillary: 159 mg/dL — ABNORMAL HIGH (ref 70–99)
Glucose-Capillary: 152 mg/dL — ABNORMAL HIGH (ref 70–99)
Glucose-Capillary: 135 mg/dL — ABNORMAL HIGH (ref 70–99)

## 2022-08-16 LAB — HEPATITIS B SURFACE ANTIBODY, QUANTITATIVE: Hep B S AB Quant (Post): 35.5 m[IU]/mL (ref >9.9)

## 2022-08-16 MED ORDER — CHLORHEXIDINE GLUCONATE CLOTH 2 % EX PADS
6.0000 | MEDICATED_PAD | Freq: Two times a day (BID) | CUTANEOUS | Status: DC
  Administered 2022-08-16 – 2022-08-18 (×4): 6 via TOPICAL

## 2022-08-16 MED ORDER — CHLORHEXIDINE GLUCONATE CLOTH 2 % EX PADS: 6.0000 | MEDICATED_PAD | Freq: Every day | CUTANEOUS | Status: DC

## 2022-08-16 NOTE — Progress Notes (Signed)
Occupational Therapy Session Note  Patient Details  Name: Edward [name] MRN: [id number] Date of Birth: 1971/05/14  Today's Date: 08/16/2022 OT Individual Time: 5465-6812 & 1300-1400 OT Individual Time Calculation (min): 42 min & 60 min OT missed time: 15 min Missed time reason: fatigue   Short Term Goals: Week 1:  OT Short Term Goal 1 (Week 1): Patient will increased LB dressing at bed level to Max A. OT Short Term Goal 2 (Week 1): Patient will demonstrate improved functional performance during toilet transfers utilizing most appropriate technique with Mod A x1.  Skilled Therapeutic Interventions/Progress Updates:  Session 1 Skilled OT intervention completed with focus on bed level self-care and sitting tolerance/balance. Pt received upright in bed, agreeable to session. Un-rated pain reported in L residual limb- pre-medicated. Therapist offered rest breaks and repositioning throughout for pain reduction. Pt also presented with cough that he reports is not new but is disruptive to his sleep and had productive sputum- nurse made aware of status and pt request for something to assist with cough management.  Pt agreeable to bathing/dressing with discussion of bed level self-care for ease of access and 2/2 posterior bias in sitting. Able to doff shirt with mod A, with max A needed to sit upright off of back rest with HOB elevated to about 45 degrees. Once up, pt able to sit with use of bed rail assist with LUE with overall min A during shirt donning. Bathed UB with supervision/cues needed for accessing all areas, dependent assist for doffing pants with pt able to lift bilateral residual limbs to clear buttocks vs rolling. LB bathing excluding peri-areas per request with supervision.   With [facility] elevated to 75 degrees, pt able to bring himself into sitting with mod A using bilateral hand rails, overall min A for UB dressing. Pt preferring to stay sitting unsupported for pressure relief on bottom,  with discussion about repositioning for pressure relief under buttocks/on sacrum via sitting in PWC and other strategies and pt able to maintain sitting with minimal assist of LUE on bed rail with supervision for >5 mins. 2/2 time constraint, therapist assisted pt with positioning for comfort with several blankets vs pillows per request placed behind back with HOB elevated to 90 degrees.  Pt remained sitting upright in bed, with bed alarm on/activated, sister present to visit, and with all needs in reach at end of session.  Session 2 Skilled OT intervention completed with focus on oral care, [place] navigation and mobility with use of joystick. Pt received seated in PWC, having just thrown up his lunch per pt report. Pt reported he choked on the roast beef and couldn't get it up so he threw up to be able to breathe. NT, nurse and MD made aware of swallowing difficulty, with noted aspiration during oral care during session with request for speech eval for swallowing assessment as well as potential check for chest x-ray to rule out pneumonia. Pt requested pain meds however was pre-medicated/not due for more with therapist offering repositioning via tilt feature on PWC and distraction throughout for pain reduction.   Per nursing- new standard bed not switched out with pt opting to stay in [facility] during session with motivation to see outside his hospital room.   Pt requires max verbal cues and min-mod physical assist with use of joystick for mobilizing in [place] for all navigation and also anticipate pt's fatigue level effected his concentration and attendance to task.   Seated at sink, pt attempted to brush teeth with  universal cuff however pt did not prefer inability to rotate toothbrush for top/bottom teeth himself, therefore therapist switched out cuff for red built up handle with pt then able to complete oral care with repositioning of toothbrush with supervision with cues needed for spitting/rinsing vs swallowing  in which pt did show signs of aspiration with.  In hallway, therapist educated pt on joystick navigation and discussed directional patterns/options for handling with his L hand with the multiple absent digits. Discussed options for different kinds of steering for [place] with potential need for cuff for navigation in personal PWC 2/2 absent digits, difficulty steering and prognosis/likelihood of losing the rest of his digits.  Unable to complete turns, and total A needed and several instances needed of therapist correcting pt's steering due to close encounters with wall. But overall with setting on slowest speed and max cues, able to navigate about 50 ft x2 with several interruptions.  Back in room, pt requested to remain in chair stating "I need rest" with therapist instructing pt to call for assist when ready to return to bed. PWC belt and alarm belt donned, with chair tilted about 30 degrees back and chair in the off position. Covering NT notified of pt status and recommendation of maximove for assist back to bed 2/2 pt lethargy and fatigue. Pt missed 15 mins of OT intervention 2/2 fatigue and request to sleep. Will make up missed time as able.  Therapy Documentation Precautions:  Precautions Precautions: Fall Precaution Comments: previous R AKA, multiple digit amputations Restrictions Weight Bearing Restrictions: Yes RUE Weight Bearing: Weight bearing as tolerated RLE Weight Bearing:  (Bilateral AKA) LLE Weight Bearing: Non weight bearing    Therapy/Group: Individual Therapy  Blase Mess, MS, OTR/L  08/16/2022, 3:37 PM

## 2022-08-16 NOTE — IPOC Note (Signed)
Overall Plan of Care [facility]) Patient Details Name: Edward [name] MRN: [id number] DOB: 03-27-1971  Admitting Diagnosis: S/P bilateral above knee amputation [facility])  Hospital Problems: Principal Problem:   S/P bilateral above knee amputation ([place])     Functional Problem List: Nursing Bladder, Bowel, Safety, Endurance, Medication Management, Skin Integrity  PT Balance, Edema, Endurance, Motor, Nutrition, Pain, Perception, Skin Integrity  OT Balance, Safety, Sensory, Motor, Endurance, Skin Integrity, Pain  SLP    TR         Basic ADL's: OT Bathing, Dressing, Toileting     Advanced  ADL's: OT       Transfers: PT Bed Mobility, Bed to Chair, Car, Manufacturing systems engineer, Metallurgist: PT Ambulation, Emergency planning/management officer, Stairs     Additional Impairments: OT    SLP        TR      Anticipated Outcomes Item Anticipated Outcome  Self Feeding    Swallowing      Basic self-care  Min A  Toileting  Mod A   Bathroom Transfers MIn A (to BSC)  Bowel/Bladder  manage w mod I assist  Transfers  CGA with LRAD  Locomotion  N/A  Communication     Cognition     Pain  < 4 with prns  Safety/Judgment  manage w cues   Therapy Plan: PT Intensity: Minimum of 1-2 x/day ,45 to 90 minutes PT Frequency: 5 out of 7 days PT Duration Estimated Length of Stay: 10-12 days OT Intensity: Minimum of 1-2 x/day, 45 to 90 minutes OT Frequency: 5 out of 7 days OT Duration/Estimated Length of Stay: 14 days     Team Interventions: Nursing Interventions Bowel Management, Medication Management, Disease Management/Prevention, Patient/Family Education, Skin Care/Wound Management, Discharge Planning, Pain Management  PT interventions Community reintegration, Ambulation/gait training, DME/adaptive equipment instruction, Neuromuscular re-education, Psychosocial support, Stair training, UE/LE Strength taining/ROM, Wheelchair propulsion/positioning, Training and development officer, Discharge  planning, Pain management, Skin care/wound management, Therapeutic Activities, UE/LE Coordination activities, Disease management/prevention, Functional mobility training, Patient/family education, Splinting/orthotics, Therapeutic Exercise, Visual/perceptual remediation/compensation  OT Interventions Balance/vestibular training, Discharge planning, Pain management, Self Care/advanced ADL retraining, Therapeutic Activities, UE/LE Coordination activities, Functional mobility training, Patient/family education, Skin care/wound managment, Therapeutic Exercise, Community reintegration, DME/adaptive equipment instruction, Neuromuscular re-education, Psychosocial support, UE/LE Strength taining/ROM, Wheelchair propulsion/positioning  SLP Interventions    TR Interventions    SW/CM Interventions Discharge Planning, Psychosocial Support, Patient/Family Education, Disease Management/Prevention   Barriers to Discharge MD  Medical stability  Nursing Hemodialysis, Home environment access/layout, Decreased caregiver support 1 level 2 ste bil rail w sons;Primarily does lateral scoots for transfers  without sliding board; reports normally could scoot on his own but as he has gotten sicker his sons have assisted  ADLs Comments: Reports sons assisted with ADLs (pt does chair push up and sons pull up pants). Took showers using tub bench.  PT Inaccessible home environment, Home environment access/layout, Wound Care, Weight bearing restrictions, Other (comments) pain, bilateral AKA, prosthetic for RLE but has not used yet, multiple digit amuptations  OT Wound Care, Hemodialysis, Weight bearing restrictions    SLP      SW Lack of/limited family support, Decreased caregiver support, Inaccessible home environment, Hemodialysis     Team Discharge Planning: Destination: PT-Home ,OT- Home , SLP-  Projected Follow-up: PT-Home health PT, OT-  24 hour supervision/assistance, Home health OT, SLP-  Projected Equipment Needs:  PT-To be determined, OT- 3 in 1 bedside comode, SLP-  Equipment Details: PT-has manual WC,  RW, and SPC, OT-  Patient/family involved in discharge planning: PT- Patient,  OT-Patient, SLP-   MD ELOS: 12-14 days Medical Rehab Prognosis:  Excellent Assessment: The patient has been admitted for CIR therapies with the diagnosis of left AKA. The team will be addressing functional mobility, strength, stamina, balance, safety, adaptive techniques and equipment, self-care, bowel and bladder mgt, patient and caregiver education. Goals have been set at supervision. Anticipated discharge destination is home.        See Team Conference Notes for weekly updates to the plan of care

## 2022-08-16 NOTE — Progress Notes (Signed)
Physical Therapy Session Note  Patient Details  Name: Edward [name] MRN: [id number] Date of Birth: 12/22/1970  Today's Date: 08/16/2022 PT Individual Time: 1045-1200 PT Individual Time Calculation (min): 75 min   Short Term Goals: Week 1:  PT Short Term Goal 1 (Week 1): pt will perform bed mobility with mod A of 1 PT Short Term Goal 2 (Week 1): pt will transfer bed<>chair with LRAD and min A of 1 PT Short Term Goal 3 (Week 1): pt will particpate in WC propulsion up to 43f  Skilled Therapeutic Interventions/Progress Updates:    Received pt semi-reclined in bed with sister present at bedside, but did not stay through entire session. Pt agreeable to PT treatment and did not rate pain level during session but complained of sacral pain and pain in L residual limb - plan to receive new bed today. Prepared to transfer into long sitting for transfer into power WC but pt found to be soiled in stool. Laid bed flat and rolled L/R with max A of 2 and removed soiled underwear and performed peri-care dependently. Noted MASD wound on R groin and beginning of wound on sacrum - notified RN and RN present to inspect wounds and applied sacral dressing.   Donned clean brief and boxers in supine in same manner with max A +2 via rolling L/R. Residual limb wrapping came off; redressed and rewrapped limb with abdominal pad, gauze, and ace wrap with +2 assist to hold limb. Pt transferred semi-reclined<>long sitting with +2 assist and turned around to prepare for PA transfer with +2 assist using chuck pad. Pt required mod A overall for trunk control due to posterior and L/R lateral LOB. Worked on reciprocal scooting backwards to edge of bed with mod A +2 and provided max multimodal cues for hand placement for pt to assist with transfer. Pt then transferred bed<>power WC via PA transfer with max A +2. Attempted to use transfer board to assist with scooting into WC, but board would not stay in place - therefore removed for  safety and just used chuck pad. Pt required +2 assist to reposition chuck pad in power WC and concluded session with pt reclined in power WC, needs within reach, and seatbelt on.   Therapy Documentation Precautions:  Precautions Precautions: Fall Precaution Comments: previous R AKA, multiple digit amputations Restrictions Weight Bearing Restrictions: Yes RUE Weight Bearing: Weight bearing as tolerated RLE Weight Bearing:  (Bilateral AKA) LLE Weight Bearing: Non weight bearing  Therapy/Group: Individual Therapy AAlfonse AlpersPT, DPT  08/16/2022, 7:27 AM

## 2022-08-16 NOTE — Progress Notes (Signed)
Patient ID: Edward [name], male   DOB: 12/19/1970, 51 y.o.   MRN: [id number]  [facility] established with [facility]. WC eval 1/2. Drop arm ordered through [employer]. No additional reccs listed.

## 2022-08-16 NOTE — Progress Notes (Signed)
Orthopedic Tech Progress Note Patient Details:  Edward [name] 14-Dec-1970 [id number]  Patient ID: Edward [name], male   DOB: 1970/12/14, 51 y.o.   MRN: [id number] [facility]2 with no answer, will try again later. [name] 08/16/2022, 1:10 PM

## 2022-08-16 NOTE — Evaluation (Signed)
Recreational Therapy Assessment and Plan  Patient Details  Name: Edward [name] MRN: [id number] Date of Birth: [birth year] Today's Date: 08/16/2022  Rehab Potential: Fair ELOS:   d/c 1/5  Assessment  Hospital Problem: Principal Problem:   S/P bilateral above knee amputation ([place])     Past Medical History:      Past Medical History:  Diagnosis Date   Allergy     Anemia      getting Venofer with dialysis   Anxiety      self reported, sometimes-situational anxiety   Arthritis      bilateral hands/LEFT hip   CHF (congestive heart failure) ([place])     Chronic kidney disease      t th s dialysis- [place] ---ESRD   Coronary artery disease     Depression      self reported, sometimes-situational depression   Diabetes mellitus      type II- on meds   Dyspnea      sometimes- fluid   GERD (gastroesophageal reflux disease)      on meds   History of blood transfusion     Hypertension      no longer on medication since starting on Hemodialysis   Myocardial infarction ([place]) 2019   Peripheral vascular disease ([place])     Sleep apnea      does not use CPAP    Past Surgical History:       Past Surgical History:  Procedure Laterality Date   A/V FISTULAGRAM Left 03/06/2020    Procedure: A/V FISTULAGRAM;  Surgeon: Waynetta Sandy, MD;  Location: [place] [facility];  Service: Cardiovascular;  Laterality: Left;   ABDOMINAL AORTOGRAM N/A 01/07/2022    Procedure: ABDOMINAL AORTOGRAM;  Surgeon: Waynetta Sandy, MD;  Location: [place] [facility];  Service: Cardiovascular;  Laterality: N/A;   ABDOMINAL AORTOGRAM W/LOWER EXTREMITY N/A 04/09/2021    Procedure: ABDOMINAL AORTOGRAM W/LOWER EXTREMITY;  Surgeon: Waynetta Sandy, MD;  Location: [place] [facility];  Service: Cardiovascular;  Laterality: N/A;   AMPUTATION Left 05/10/2016    Procedure: Left Transmetatarsal Amputation;  Surgeon: [name], MD;  Location: [place];  Service: Orthopedics;  Laterality: Left;    AMPUTATION Right 05/16/2018    Procedure: PARTIAL RAY AMPUTATION FIFTH TOE RIGHT FOOT;  Surgeon: [name], DPM;  Location: [place];  Service: Podiatry;  Laterality: Right;   AMPUTATION Right 06/17/2018    Procedure: AMPUTATION 4th RAY Right Foot;  Surgeon: Trula Slade, DPM;  Location: [place];  Service: Podiatry;  Laterality: Right;   AMPUTATION Right 06/19/2018    Procedure: RIGHT BELOW KNEE AMPUTATION;  Surgeon: [name], MD;  Location: [place];  Service: Orthopedics;  Laterality: Right;   AMPUTATION Left 10/05/2021    Procedure: LEFT RING FINGER AMPUTATION;  Surgeon: Daryll Brod, MD;  Location: [place];  Service: Orthopedics;  Laterality: Left;  45 MIN   AMPUTATION Left 11/15/2021    Procedure: LEFT INDEX FINGER AMPUTATION;  Surgeon: Leanora Cover, MD;  Location: [place];  Service: Orthopedics;  Laterality: Left;  60 MIN   AMPUTATION Right 01/08/2022    Procedure: AMPUTATION ABOVE KNEE;  Surgeon: Waynetta Sandy, MD;  Location: [place];  Service: Vascular;  Laterality: Right;   AMPUTATION Bilateral 01/12/2022    Procedure: LEFT INDEX FINGER AMPUTATION, RIGHT INDEX, LONG AND SMALL DIGITS AMPUTATION;  Surgeon: Leanora Cover, MD;  Location: [place];  Service: Orthopedics;  Laterality: Bilateral;   AMPUTATION Right 04/01/2022  Procedure: RIGHT INDEX FINGER AMPUTATION AT METACARPAL PHALANGEAL JOINT, INCISION AND DRAINAGE RIGHT HAND;  Surgeon: Leanora Cover, MD;  Location: [place];  Service: Orthopedics;  Laterality: Right;  90 MIN   AMPUTATION Right 05/23/2022    Procedure: RIGHT INDEX FINGER RAY AMPUTATION; RIGHT LONG, RING, AND SMALL FINGER AMPUTATIONS;  Surgeon: Leanora Cover, MD;  Location: [place];  Service: Orthopedics;  Laterality: Right;  90 MIN   AMPUTATION Left 08/07/2022    Procedure: LEFT ABOVE KNEE AMPUTATION;  Surgeon: [name], MD;  Location: [place];  Service: Orthopedics;  Laterality: Left;   AORTIC ARCH ANGIOGRAPHY N/A 01/07/2022    Procedure: AORTIC ARCH ANGIOGRAPHY;   Surgeon: Waynetta Sandy, MD;  Location: [place] [facility];  Service: Cardiovascular;  Laterality: N/A;   APPLICATION OF WOUND VAC Right 06/19/2018    Procedure: APPLICATION OF WOUND VAC;  Surgeon: [name], MD;  Location: [place];  Service: Orthopedics;  Laterality: Right;   APPLICATION OF WOUND VAC Left 08/07/2022    Procedure: APPLICATION OF WOUND VAC;  Surgeon: [name], MD;  Location: [place];  Service: Orthopedics;  Laterality: Left;   AV FISTULA PLACEMENT Left 11/25/2019    Procedure: LEFT ARM Brachiocephalic  ARTERIOVENOUS (AV) FISTULA CREATION;  Surgeon: Rosetta Posner, MD;  Location: [place];  Service: Vascular;  Laterality: Left;   [place] Left 07/19/2020    Procedure: LEFT SECOND STAGE [place];  Surgeon: Waynetta Sandy, MD;  Location: [place];  Service: Vascular;  Laterality: Left;   CIRCUMCISION   09/02/2011    Procedure: CIRCUMCISION ADULT;  Surgeon: Molli Hazard, MD;  Location: [place];  Service: Urology;  Laterality: N/A;   CORONARY STENT INTERVENTION N/A 07/29/2018    Procedure: CORONARY STENT INTERVENTION;  Surgeon: Leonie Man, MD;  Location: [place] [facility];  Service: Cardiovascular;  Laterality: N/A;   DEBRIDEMENT AND CLOSURE WOUND Left 11/15/2021    Procedure: LEFT HAND WOUND CLOSURE;  Surgeon: Leanora Cover, MD;  Location: [place];  Service: Orthopedics;  Laterality: Left;   I & D EXTREMITY   09/02/2011    Procedure: IRRIGATION AND DEBRIDEMENT EXTREMITY;  Surgeon: Theodoro Kos, DO;  Location: [place];  Service: Plastics;  Laterality: N/A;   INCISION AND DRAINAGE OF WOUND   08/12/2011    Procedure: IRRIGATION AND DEBRIDEMENT WOUND;  Surgeon: Zenovia Jarred, MD;  Location: [place];  Service: General;;  perineum   INSERTION OF DIALYSIS CATHETER N/A 11/25/2019    Procedure: INSERTION OF [place] Internal Jugular DIALYSIS CATHETER;  Surgeon: Rosetta Posner, MD;   Location: [place];  Service: Vascular;  Laterality: N/A;   [place]   08/12/2011    Procedure: IRRIGATION AND DEBRIDEMENT ABSCESS;  Surgeon: Molli Hazard, MD;  Location: [place];  Service: Urology;  Laterality: N/A;  irrigation and debridment perineum     IRRIGATION AND DEBRIDEMENT ABSCESS   08/14/2011    Procedure: MINOR INCISION AND DRAINAGE OF ABSCESS;  Surgeon: Molli Hazard, MD;  Location: [place];  Service: Urology;  Laterality: N/A;  Perineal Wound Debridement;Placement Bilateral Testicular Thigh Pouches   LEFT HEART CATH AND CORONARY ANGIOGRAPHY N/A 07/29/2018    Procedure: LEFT HEART CATH AND CORONARY ANGIOGRAPHY;  Surgeon: Leonie Man, MD;  Location: [place] [facility];  Service: Cardiovascular;  Laterality: N/A;   LOWER EXTREMITY ANGIOGRAPHY Bilateral 01/07/2022    Procedure: Lower Extremity Angiography;  Surgeon: Servando Snare  Harrell Gave, MD;  Location: [place] [facility];  Service: Cardiovascular;  Laterality: Bilateral;   PERIPHERAL VASCULAR ATHERECTOMY Left 04/09/2021    Procedure: PERIPHERAL VASCULAR ATHERECTOMY;  Surgeon: Waynetta Sandy, MD;  Location: [place] [facility];  Service: Cardiovascular;  Laterality: Left;  TP trunk and peroneal arteries   PERIPHERAL VASCULAR BALLOON ANGIOPLASTY Left 04/09/2021    Procedure: PERIPHERAL VASCULAR BALLOON ANGIOPLASTY;  Surgeon: Waynetta Sandy, MD;  Location: [place] [facility];  Service: Cardiovascular;  Laterality: Left;  Popliteal   REVISON OF ARTERIOVENOUS FISTULA Left 05/24/2020    Procedure: LEFT ARM ARTERIOVENOUS FISTULA  CONVERSION TO BASILIC VEIN FISTULA;  Surgeon: Waynetta Sandy, MD;  Location: [place];  Service: Vascular;  Laterality: Left;   TOTAL HIP ARTHROPLASTY Left 10/11/2016    Procedure: LEFT TOTAL HIP ARTHROPLASTY ANTERIOR APPROACH;  Surgeon: Mcarthur Rossetti, MD;  Location: [facility];  Service: Orthopedics;  Laterality: Left;   UPPER EXTREMITY  ANGIOGRAPHY Bilateral 01/07/2022    Procedure: Upper Extremity Angiography;  Surgeon: Waynetta Sandy, MD;  Location: [place] [facility];  Service: Cardiovascular;  Laterality: Bilateral;      Assessment & Plan Clinical Impression: Patient is a 51 y.o. year old male well-known to rehab services right below-knee amputation 06/19/2018 as well as left transmetatarsal amputation 05/10/2016 receiving inpatient rehab services 06/22/2018 - 07/01/2018 and has since undergone revision of right BKA to AKA 01/08/2022, multiple finger amputations,, quit smoking 2 years ago, chronic combined CHF with ejection fraction 45%, chronic orthostatic hypotension, end-stage renal disease with hemodialysis PVD maintained on aspirin and Plavix. Patient reports that he lives with his 2 adult aged sons.  1 level home with one-step to entry.  He has a prosthesis for right leg but started using it yet.  Presented 08/05/2022 with gangrenous changes of the left lower extremity, bradycardia and hypotension.  He was treated for hyperkalemia with K+ of 7. He failed conservative measures and underwent elective left AKA 08/07/2022 per Dr. Sharol Given.  Wound VAC applied.  Hospital course hemodialysis ongoing as per renal services.  Acute on chronic anemia 9.5.  He was cleared to begin subcutaneous heparin for DVT prophylaxis.  Therapy evaluations completed due to patient decreased functional mobility was admitted for a comprehensive rehab program.    Pt presents with decreased activity tolerance, decreased functional mobility, decreased balance, difficulty maintaining precautions, feelings of stress Limiting pt's independence with leisure/community pursuits.    Plan  Min 1 TR session >20 minutes during LOs  Recommendations for other services: Neuropsych  Discharge Criteria: Patient will be discharged from TR if patient refuses treatment 3 consecutive times without medical reason.  If treatment goals not met, if there is a change in  medical status, if patient makes no progress towards goals or if patient is discharged from hospital.  The above assessment, treatment plan, treatment alternatives and goals were discussed and mutually agreed upon: by patient  [place] 08/16/2022, 12:28 PM

## 2022-08-16 NOTE — Progress Notes (Signed)
Orthopedic Tech Progress Note Patient Details:  Edward [name] 02-04-1971 [id number]  Patient ID: [name], male   DOB: 01-Jun-1971, 51 y.o.   MRN: [id number] Order placed with [facility] for right AK shrinker.  [name] 08/16/2022, 1:22 PM

## 2022-08-16 NOTE — Progress Notes (Addendum)
PROGRESS NOTE   Subjective/Complaints: +cough, CXR and SLP eval ordered due to concerns for aspiration He has no new complaints R AKA shrinker ordered  ROS: +pruritus of back, +residual limb pain, +cough   Objective:   No results found. No results for input(s): "WBC", "HGB", "HCT", "PLT" in the last 72 hours.  No results for input(s): "NA", "K", "CL", "CO2", "GLUCOSE", "BUN", "CREATININE", "CALCIUM" in the last 72 hours.   Intake/Output Summary (Last 24 hours) at 08/16/2022 1507 Last data filed at 08/16/2022 1342 Gross per 24 hour  Intake 360 ml  Output --  Net 360 ml        Physical Exam: Vital Signs Blood pressure (!) 90/54, pulse 89, temperature 97.8 F (36.6 C), temperature source Oral, resp. rate 16, height 3' 11.24" (1.2 m), weight 73.7 kg, SpO2 98 %. Gen: no distress, normal appearing HEENT: oral mucosa pink and moist, NCAT Cardio: Reg rate Chest: normal effort, normal rate of breathing, +cough Abd: soft, non-distended Ext: no edema Psych: pleasant, normal affect Skin: wound vac removed, amputated fingers Neuro: Alert and oriented to person and place.  Knows December but does not know year or day.  Follows commands.  Cranial nerves II through XII intact. Sensation to light touch intact in all 4 extremities Strength 5 out of 5 bilateral shoulder abduction, elbow flexion and extension Able to lift bilateral lower extremities Musculoskeletal: Patient with multiple finger amputations.  He has amputations of right second, third and fifth digits at MCP and fourth digit PIP.  He has amputations of the left second and fourth digits.  Left third and fifth digits with flexion contractures.  Right AKA is well-healed.  Left AKA with wound VAC dressing in place draining serosanguineous fluid.  Appropriately tender  No joint swelling noted   Right IJ TDC Right upper extremity IV   Assessment/Plan: 1. Functional  deficits which require 3+ hours per day of interdisciplinary therapy in a comprehensive inpatient rehab setting. Physiatrist is providing close team supervision and 24 hour management of active medical problems listed below. Physiatrist and rehab team continue to assess barriers to discharge/monitor patient progress toward functional and medical goals  Care Tool:  Bathing    Body parts bathed by patient: Right arm, Left arm, Chest, Abdomen, Left upper leg, Right lower leg, Face   Body parts bathed by helper: Buttocks, Right upper leg, Left upper leg Body parts n/a: Left lower leg, Right lower leg   Bathing assist Assist Level: Supervision/Verbal cueing     Upper Body Dressing/Undressing Upper body dressing   What is the patient wearing?: Pull over shirt    Upper body assist Assist Level: Minimal Assistance - Patient > 75%    Lower Body Dressing/Undressing Lower body dressing      What is the patient wearing?: Pants     Lower body assist Assist for lower body dressing: Dependent - Patient 0%     Toileting Toileting Toileting Activity did not occur (Clothing management and hygiene only): N/A (no void or bm)  Toileting assist Assist for toileting: Total Assistance - Patient < 25%     Transfers Chair/bed transfer  Transfers assist  Chair/bed transfer activity did not  occur: Safety/medical concerns  Chair/bed transfer assist level: 2 Helpers     Locomotion Ambulation   Ambulation assist   Ambulation activity did not occur: Safety/medical concerns (bilateral AKA, pain)          Walk 10 feet activity   Assist  Walk 10 feet activity did not occur: Safety/medical concerns (bilateral AKA, pain)        Walk 50 feet activity   Assist Walk 50 feet with 2 turns activity did not occur: Safety/medical concerns (bilateral AKA, pain)         Walk 150 feet activity   Assist Walk 150 feet activity did not occur: Safety/medical concerns (bilateral AKA,  pain)         Walk 10 feet on uneven surface  activity   Assist Walk 10 feet on uneven surfaces activity did not occur: Safety/medical concerns (bilateral AKA, pain)         Wheelchair     Assist Is the patient using a wheelchair?: Yes Type of Wheelchair: Manual Wheelchair activity did not occur: Safety/medical concerns (multiple digit amputations, bilateral AKA)         Wheelchair 50 feet with 2 turns activity    Assist    Wheelchair 50 feet with 2 turns activity did not occur: Safety/medical concerns (multiple digit amputations, bilateral AKA)       Wheelchair 150 feet activity     Assist  Wheelchair 150 feet activity did not occur: Safety/medical concerns (multiple digit amputations, bilateral AKA)       Blood pressure (!) 90/54, pulse 89, temperature 97.8 F (36.6 C), temperature source Oral, resp. rate 16, height 3' 11.24" (1.2 m), weight 73.7 kg, SpO2 98 %.  Medical Problem List and Plan: 1. Functional deficits secondary to left AKA 08/05/2022 due to gangrenous changes.  Status post wound VAC.  History of right TKA 01/08/2022             -patient may shower             -ELOS/Goals: 12-14 days , PT/OT min assist              Continue CIR  Shrinker ordered 2. Impaired mobility: order power wheelchair -DVT/anticoagulation:  Pharmaceutical: Heparin             -antiplatelet therapy: Aspirin 81 mg daily and Plavix 75 mg daily 3. Pain Management: Neurontin 100 mg 3 times daily, oxycodone as needed. Add vitamin C 4. Anxiety: Xanax d/ced given hypotension 5. Neuropsych/cognition: This patient is capable of making decisions on his own behalf. 6. Residual limb incision: remove wound vac 7. Fluids/Electrolytes/Nutrition: Routine in and outs with follow-up chemistries 8.  End-stage renal disease.  Hemodialysis as directed per Nephrology. TTS schedule 9.  Acute on chronic anemia. Last HGB 9.5.  Follow-up CBC 10.  Diabetes mellitus.  Hemoglobin A1c 6.3.   Tradjenta 5 mg daily/SSI. CBGs well controlled 11.  Chronic orthostasis.  ProAmatine 10 mg 3 times daily.  Monitor with increased mobility. Monitor fluid intake. 12.  Constipation.  Linzess daily and MiraLAX as needed. LBM 12/26 13.  GERD.  Protonix 14.  Chronic combined CHF.  Ejection fraction 45%.  Monitor for any signs of fluid overload. Daily weights 15. Hyperkalemia. Resolved. continue HD per nephrology. 16. Acute transaminitis and cholelithiasis. monitor LFTs. 17. Hypotension: d/c xanax, advised to only give additional midodrine if symptomatic.  18. Pruritus: hydrocortisone ordered 19. Loose stool: d/c nepro 20. Cough: CXR ordered. SLP ordered  LOS: 3 days A  FACE TO FACE EVALUATION WAS PERFORMED  Clide Deutscher Nysia Dell 08/16/2022, 3:07 PM

## 2022-08-16 NOTE — Progress Notes (Signed)
[facility] Progress Note   Subjective:   Some pain in leg today, no other concerns. Denies SOB, CP, palpitations, dizziness and nausea.   Objective Vitals:   08/15/22 1745 08/15/22 1855 08/15/22 1951 08/16/22 0528  BP:  100/64 91/61 (!) 90/54  Pulse:  90 96 89  Resp:  '18 15 16  '$ Temp: 97.7 F (36.5 C) 97.9 F (36.6 C) 98.5 F (36.9 C) 97.8 F (36.6 C)  TempSrc: Axillary Oral  Oral  SpO2: 96%  100% 98%  Weight:      Height:       Physical Exam General: Alert male in NAD Heart: RRR, no murmurs, rubs or gallops Lungs: CTA bilaterally Abdomen: Soft, non-distended, +BS Extremities: b/l AKA, no edema  Dialysis Access: R IJ [facility]  Additional Objective Labs: Basic Metabolic Panel: Recent Labs  Lab 08/10/22 0240 08/13/22 0919  NA 135 138  K 4.5 4.5  CL 95* 96*  CO2 21* 20*  GLUCOSE 191* 145*  BUN 62* 78*  CREATININE 6.29* 7.96*  CALCIUM 8.7* 8.7*  PHOS  --  5.7*   Liver Function Tests: Recent Labs  Lab 08/10/22 0240 08/13/22 0919  AST 30  --   ALT 28  --   ALKPHOS 111  --   BILITOT 1.5*  --   PROT 6.8  --   ALBUMIN 2.6* 2.5*   No results for input(s): "LIPASE", "AMYLASE" in the last 168 hours. CBC: Recent Labs  Lab 08/10/22 0240 08/10/22 0858 08/13/22 0919  WBC 9.7 8.1 13.2*  NEUTROABS 7.4  --   --   HGB 9.6* 8.7* 9.5*  HCT 29.9* 27.4* 31.2*  MCV 93.7 93.5 96.0  PLT 169 184 217   Blood Culture    Component Value Date/Time   SDES BLOOD RIGHT ARM 08/05/2022 2105   SDES BLOOD RIGHT ARM 08/05/2022 2105   SPECREQUEST  08/05/2022 2105    BOTTLES DRAWN AEROBIC AND ANAEROBIC Blood Culture adequate volume   SPECREQUEST  08/05/2022 2105    BOTTLES DRAWN AEROBIC AND ANAEROBIC Blood Culture adequate volume   CULT  08/05/2022 2105    NO GROWTH 5 DAYS Performed at [facility] [address] [place], [place] [address]    CULT  08/05/2022 2105    NO GROWTH 5 DAYS Performed at [facility] [address], [place], [place]  [address]    REPTSTATUS 08/10/2022 FINAL 08/05/2022 2105   REPTSTATUS 08/10/2022 FINAL 08/05/2022 2105    Cardiac Enzymes: No results for input(s): "CKTOTAL", "CKMB", "CKMBINDEX", "TROPONINI" in the last 168 hours. CBG: Recent Labs  Lab 08/15/22 0544 08/15/22 1154 08/15/22 1853 08/15/22 2257 08/16/22 0612  GLUCAP 143* 131* 107* 189* 172*   Iron Studies: No results for input(s): "IRON", "TIBC", "TRANSFERRIN", "FERRITIN" in the last 72 hours. '@lablastinr3'$ @ Studies/Results: No results found. Medications:   vitamin C  1,000 mg Oral Daily   aspirin EC  81 mg Oral Daily   B-complex with vitamin C  1 tablet Oral Daily   clopidogrel  75 mg Oral Daily   gabapentin  100 mg Oral TID   heparin  5,000 Units Subcutaneous Q12H   hydrocortisone cream   Topical BID   insulin aspart  0-9 Units Subcutaneous TID WC   linaclotide  72 mcg Oral q AM   linagliptin  5 mg Oral Daily   midodrine  10 mg Oral TID WC   nutrition supplement (JUVEN)  1 packet Oral BID BM   pantoprazole  40 mg  Oral Daily   sodium bicarbonate  650 mg Oral BID   sucroferric oxyhydroxide  1,000 mg Oral BID WC   zinc sulfate  220 mg Oral Daily    Dialysis Orders: [facility]  4.5h  425/ 700   89kg  2/2 bath  TDC  Hep 8000+ 2033md - mircera 1561m q2, last 12/16, due 12/30 - venofer '50mg'$  IV weekly - hectorol 1038mIV TIW - korsuva for itching  - velphoro 3 tabs with meals- last phos 12.5  Assessment/Plan: L foot wound - S/P L AKA per Dr. DudSharol Given/20. Now in CIR for rehab. ESRD -  TTS HD. OP Heparin dose too high for current wt. Lower dose on discharge. Next HD 08/17/2022. Hypotension/volume: BP remains soft, has hx of hypotension at OP Clinic as well. Pain medication frequency decreased 08/14/2022 and again 12/29. Started midodrine here at 10 mg tid. Euvolemic and several kg under EDW. Will need lower EDW on discharge post AKA. No Antihypertensive meds.  Anemia: Hgb 9.5. ESA due 08/17/2022.   Metabolic bone disease:  Calcium elevated, holding hectorol for now.  Resumed binders. PO4 5.7   Nutrition:  Renal diet. Very low albumin. Add protein supps.  Abnormal LFT's - RUQ US [place] cholelithiasis and gallbladder wall thickening.  Negative US [place] sign, acute hepatitis panel negative.  Per primary.  Bradycardia: Resolved Hyperkalemia:Resolved.   SamAnice PaganiniA-C 08/16/2022, 11:43 AM  [facility] Pager: [phone]

## 2022-08-16 NOTE — Progress Notes (Signed)
Patient ID: Edward [name], male   DOB: 22-May-1971, 51 y.o.   MRN: [id number]  Family education Sunday 1-4 with son

## 2022-08-17 DIAGNOSIS — Z89611 Acquired absence of right leg above knee: Secondary | ICD-10-CM | POA: Diagnosis not present

## 2022-08-17 DIAGNOSIS — N184 Chronic kidney disease, stage 4 (severe): Secondary | ICD-10-CM | POA: Diagnosis not present

## 2022-08-17 DIAGNOSIS — Z89612 Acquired absence of left leg above knee: Secondary | ICD-10-CM | POA: Diagnosis not present

## 2022-08-17 DIAGNOSIS — I130 Hypertensive heart and chronic kidney disease with heart failure and stage 1 through stage 4 chronic kidney disease, or unspecified chronic kidney disease: Secondary | ICD-10-CM | POA: Diagnosis not present

## 2022-08-17 DIAGNOSIS — E1122 Type 2 diabetes mellitus with diabetic chronic kidney disease: Secondary | ICD-10-CM | POA: Diagnosis not present

## 2022-08-17 DIAGNOSIS — I5042 Chronic combined systolic (congestive) and diastolic (congestive) heart failure: Secondary | ICD-10-CM | POA: Diagnosis not present

## 2022-08-17 LAB — GLUCOSE, CAPILLARY
Glucose-Capillary: 147 mg/dL — ABNORMAL HIGH (ref 70–99)
Glucose-Capillary: 166 mg/dL — ABNORMAL HIGH (ref 70–99)
Glucose-Capillary: 168 mg/dL — ABNORMAL HIGH (ref 70–99)
Glucose-Capillary: 121 mg/dL — ABNORMAL HIGH (ref 70–99)

## 2022-08-17 MED ORDER — DARBEPOETIN ALFA 100 MCG/0.5ML IJ SOSY
100.0000 ug | PREFILLED_SYRINGE | INTRAMUSCULAR | Status: DC
  Administered 2022-08-18: 100 ug via SUBCUTANEOUS
  Filled 2022-08-17 (×3): qty 0.5

## 2022-08-17 NOTE — Plan of Care (Signed)
  Problem: RH Swallowing Goal: LTG Patient will consume least restrictive diet using compensatory strategies with assistance (SLP) Description: LTG:  Patient will consume least restrictive diet using compensatory strategies with assistance (SLP) Flowsheets (Taken 08/17/2022 0801) LTG: Pt Patient will consume least restrictive diet using compensatory strategies with assistance of (SLP): Modified Independent   Problem: RH Swallowing Goal: LTG Patient will participate in dysphagia therapy to increase swallow function with assistance (SLP) Description: LTG:  Patient will participate in dysphagia therapy to increase swallow function with assistance (SLP) Flowsheets (Taken 08/17/2022 0802) LTG: Pt will participate in dysphagia therapy to increase swallow function with assistance of (SLP): Modified Independent

## 2022-08-17 NOTE — Evaluation (Signed)
Speech Language Pathology Assessment and Plan  Patient Details  Name: Edward [name] MRN: [id number] Date of Birth: 08-27-1970  SLP Diagnosis: Dysphagia  Rehab Potential: Excellent ELOS: 1/5   Today's Date: 08/17/2022 SLP Individual Time: 4098-1191 SLP Individual Time Calculation (min): 1 min  Hospital Problem: Principal Problem:   S/P bilateral above knee amputation ([place])  Past Medical History:  Past Medical History:  Diagnosis Date   Allergy    Anemia    getting Venofer with dialysis   Anxiety    self reported, sometimes-situational anxiety   Arthritis    bilateral hands/LEFT hip   CHF (congestive heart failure) ([place])    Chronic kidney disease    t th s dialysis- [place] ---ESRD   Coronary artery disease    Depression    self reported, sometimes-situational depression   Diabetes mellitus    type II- on meds   Dyspnea    sometimes- fluid   GERD (gastroesophageal reflux disease)    on meds   History of blood transfusion    Hypertension    no longer on medication since starting on Hemodialysis   Myocardial infarction ([place]) 2019   Peripheral vascular disease ([place])    Sleep apnea    does not use CPAP   Past Surgical History:  Past Surgical History:  Procedure Laterality Date   A/V FISTULAGRAM Left 03/06/2020   Procedure: A/V FISTULAGRAM;  Surgeon: Waynetta Sandy, MD;  Location: [place] [facility];  Service: Cardiovascular;  Laterality: Left;   ABDOMINAL AORTOGRAM N/A 01/07/2022   Procedure: ABDOMINAL AORTOGRAM;  Surgeon: Waynetta Sandy, MD;  Location: [place] [facility];  Service: Cardiovascular;  Laterality: N/A;   ABDOMINAL AORTOGRAM W/LOWER EXTREMITY N/A 04/09/2021   Procedure: ABDOMINAL AORTOGRAM W/LOWER EXTREMITY;  Surgeon: Waynetta Sandy, MD;  Location: [place] [facility];  Service: Cardiovascular;  Laterality: N/A;   AMPUTATION Left 05/10/2016   Procedure: Left Transmetatarsal Amputation;  Surgeon: [name], MD;   Location: [place];  Service: Orthopedics;  Laterality: Left;   AMPUTATION Right 05/16/2018   Procedure: PARTIAL RAY AMPUTATION FIFTH TOE RIGHT FOOT;  Surgeon: [name], DPM;  Location: [place];  Service: Podiatry;  Laterality: Right;   AMPUTATION Right 06/17/2018   Procedure: AMPUTATION 4th RAY Right Foot;  Surgeon: Trula Slade, DPM;  Location: [place];  Service: Podiatry;  Laterality: Right;   AMPUTATION Right 06/19/2018   Procedure: RIGHT BELOW KNEE AMPUTATION;  Surgeon: [name], MD;  Location: [place];  Service: Orthopedics;  Laterality: Right;   AMPUTATION Left 10/05/2021   Procedure: LEFT RING FINGER AMPUTATION;  Surgeon: Daryll Brod, MD;  Location: [place];  Service: Orthopedics;  Laterality: Left;  45 MIN   AMPUTATION Left 11/15/2021   Procedure: LEFT INDEX FINGER AMPUTATION;  Surgeon: Leanora Cover, MD;  Location: [place];  Service: Orthopedics;  Laterality: Left;  60 MIN   AMPUTATION Right 01/08/2022   Procedure: AMPUTATION ABOVE KNEE;  Surgeon: Waynetta Sandy, MD;  Location: [place];  Service: Vascular;  Laterality: Right;   AMPUTATION Bilateral 01/12/2022   Procedure: LEFT INDEX FINGER AMPUTATION, RIGHT INDEX, LONG AND SMALL DIGITS AMPUTATION;  Surgeon: Leanora Cover, MD;  Location: [place];  Service: Orthopedics;  Laterality: Bilateral;   AMPUTATION Right 04/01/2022   Procedure: RIGHT INDEX FINGER AMPUTATION AT METACARPAL PHALANGEAL JOINT, INCISION AND DRAINAGE RIGHT HAND;  Surgeon: Leanora Cover, MD;  Location: [place];  Service: Orthopedics;  Laterality: Right;  90 MIN   AMPUTATION Right  05/23/2022   Procedure: RIGHT INDEX FINGER RAY AMPUTATION; RIGHT LONG, RING, AND SMALL FINGER AMPUTATIONS;  Surgeon: Leanora Cover, MD;  Location: [place];  Service: Orthopedics;  Laterality: Right;  90 MIN   AMPUTATION Left 08/07/2022   Procedure: LEFT ABOVE KNEE AMPUTATION;  Surgeon: [name], MD;  Location: [place];  Service: Orthopedics;  Laterality: Left;   AORTIC ARCH ANGIOGRAPHY  N/A 01/07/2022   Procedure: AORTIC ARCH ANGIOGRAPHY;  Surgeon: Waynetta Sandy, MD;  Location: [place] [facility];  Service: Cardiovascular;  Laterality: N/A;   APPLICATION OF WOUND VAC Right 06/19/2018   Procedure: APPLICATION OF WOUND VAC;  Surgeon: [name], MD;  Location: [place];  Service: Orthopedics;  Laterality: Right;   APPLICATION OF WOUND VAC Left 08/07/2022   Procedure: APPLICATION OF WOUND VAC;  Surgeon: [name], MD;  Location: [place];  Service: Orthopedics;  Laterality: Left;   AV FISTULA PLACEMENT Left 11/25/2019   Procedure: LEFT ARM Brachiocephalic  ARTERIOVENOUS (AV) FISTULA CREATION;  Surgeon: Rosetta Posner, MD;  Location: [place];  Service: Vascular;  Laterality: Left;   [place] Left 07/19/2020   Procedure: LEFT SECOND STAGE [place];  Surgeon: Waynetta Sandy, MD;  Location: [place];  Service: Vascular;  Laterality: Left;   CIRCUMCISION  09/02/2011   Procedure: CIRCUMCISION ADULT;  Surgeon: Molli Hazard, MD;  Location: [place];  Service: Urology;  Laterality: N/A;   CORONARY STENT INTERVENTION N/A 07/29/2018   Procedure: CORONARY STENT INTERVENTION;  Surgeon: Leonie Man, MD;  Location: [facility];  Service: Cardiovascular;  Laterality: N/A;   DEBRIDEMENT AND CLOSURE WOUND Left 11/15/2021   Procedure: LEFT HAND WOUND CLOSURE;  Surgeon: Leanora Cover, MD;  Location: [place];  Service: Orthopedics;  Laterality: Left;   I & D EXTREMITY  09/02/2011   Procedure: IRRIGATION AND DEBRIDEMENT EXTREMITY;  Surgeon: Theodoro Kos, DO;  Location: [place];  Service: Plastics;  Laterality: N/A;   INCISION AND DRAINAGE OF WOUND  08/12/2011   Procedure: IRRIGATION AND DEBRIDEMENT WOUND;  Surgeon: Zenovia Jarred, MD;  Location: [place];  Service: General;;  perineum   INSERTION OF DIALYSIS CATHETER N/A 11/25/2019   Procedure: INSERTION OF [place] Internal Jugular DIALYSIS  CATHETER;  Surgeon: Rosetta Posner, MD;  Location: [place];  Service: Vascular;  Laterality: N/A;   [place]  08/12/2011   Procedure: IRRIGATION AND DEBRIDEMENT ABSCESS;  Surgeon: Molli Hazard, MD;  Location: [place];  Service: Urology;  Laterality: N/A;  irrigation and debridment perineum     IRRIGATION AND DEBRIDEMENT ABSCESS  08/14/2011   Procedure: MINOR INCISION AND DRAINAGE OF ABSCESS;  Surgeon: Molli Hazard, MD;  Location: [place];  Service: Urology;  Laterality: N/A;  Perineal Wound Debridement;Placement Bilateral Testicular Thigh Pouches   LEFT HEART CATH AND CORONARY ANGIOGRAPHY N/A 07/29/2018   Procedure: LEFT HEART CATH AND CORONARY ANGIOGRAPHY;  Surgeon: Leonie Man, MD;  Location: [facility];  Service: Cardiovascular;  Laterality: N/A;   LOWER EXTREMITY ANGIOGRAPHY Bilateral 01/07/2022   Procedure: Lower Extremity Angiography;  Surgeon: Waynetta Sandy, MD;  Location: [facility] [facility];  Service: Cardiovascular;  Laterality: Bilateral;   PERIPHERAL VASCULAR ATHERECTOMY Left 04/09/2021   Procedure: PERIPHERAL VASCULAR ATHERECTOMY;  Surgeon: Waynetta Sandy, MD;  Location: [place] [facility];  Service: Cardiovascular;  Laterality: Left;  TP trunk and peroneal arteries   PERIPHERAL VASCULAR BALLOON ANGIOPLASTY Left  04/09/2021   Procedure: PERIPHERAL VASCULAR BALLOON ANGIOPLASTY;  Surgeon: Waynetta Sandy, MD;  Location: [facility];  Service: Cardiovascular;  Laterality: Left;  Popliteal   REVISON OF ARTERIOVENOUS FISTULA Left 05/24/2020   Procedure: LEFT ARM ARTERIOVENOUS FISTULA  CONVERSION TO BASILIC VEIN FISTULA;  Surgeon: Waynetta Sandy, MD;  Location: [place];  Service: Vascular;  Laterality: Left;   TOTAL HIP ARTHROPLASTY Left 10/11/2016   Procedure: LEFT TOTAL HIP ARTHROPLASTY ANTERIOR APPROACH;  Surgeon: Mcarthur Rossetti, MD;  [facility];  Service: Orthopedics;  Laterality:  Left;   UPPER EXTREMITY ANGIOGRAPHY Bilateral 01/07/2022   Procedure: Upper Extremity Angiography;  Surgeon: Waynetta Sandy, MD;  Location: [facility];  Service: Cardiovascular;  Laterality: Bilateral;    Assessment / Plan / Recommendation Clinical Impression  Edward [name] 51 year old right-handed male well-known to rehab services right below-knee amputation 06/19/2018 as well as left transmetatarsal amputation 05/10/2016 receiving inpatient rehab services 06/22/2018 - 07/01/2018 and has since undergone revision of right BKA to AKA 01/08/2022, multiple finger amputations,, quit smoking 2 years ago, chronic combined CHF with ejection fraction 45%, chronic orthostatic hypotension, end-stage renal disease with hemodialysis PVD maintained on aspirin and Plavix. Patient reports that he lives with his 2 adult aged sons.  1 level home with one-step to entry.  He has a prosthesis for right leg but started using it yet.  Presented 08/05/2022 with gangrenous changes of the left lower extremity, bradycardia and hypotension.  He was treated for hyperkalemia with K+ of 7. He failed conservative measures and underwent elective left AKA 08/07/2022 per Dr. Sharol Given.   Patient transferred to CIR on 08/13/2022 .   Pt seen for Bedside Swallow Evaluation d/t noted increase cough with CXR ordered and completed 12/29 for concerns of aspiration (please see imaging tab in chart). Pt observed at AM meal with regular, mech soft, puree solids and thin liquids. Pt consuming solids with functional mastication, min oral stasis and min R buccal pocketing. Residue was effectively cleared with cued lingual sweep and liquid assistance. Pt consuming thin liquids with no overt s/s aspiration or change in vocal quality. Pt demonstrating no cough throughout entirety of eval, with and without presence of PO. Pt provided education on general swallow precautions and strategies to reduce oral stasis and buccal pocketing, pt verbalized  understanding. When SLP returned to hang sign in room, pt noted to be lying flat in bed after finishing meal. SLP providing education on GERD precautions and strategies including best positioning recommendations for during and after PO intake. Pt will benefit from 1-2x f/u ST sessions to monitor tolerance of regular/thin diet and continue training and education with patient and family on swallow and GERD precautions before discharge home with 2 sonds.    Skilled Therapeutic Interventions          Pt participating in Bedside Swallow Evaluation, please see above.  SLP Assessment  Patient will need skilled Speech Lanaguage Pathology Services during CIR admission    Recommendations  SLP Diet Recommendations: Age appropriate regular solids;Thin Liquid Administration via: Cup;Straw Medication Administration: Whole meds with liquid Supervision: Patient able to self feed Compensations: Lingual sweep for clearance of pocketing;Follow solids with liquid Postural Changes and/or Swallow Maneuvers: Seated upright 90 degrees;Upright 30-60 min after meal Oral Care Recommendations: Oral care BID Patient destination: Home Follow up Recommendations: None Equipment Recommended: None recommended by SLP    SLP Frequency 1 to 3 out of 7 days   SLP Duration  SLP Intensity  SLP Treatment/Interventions 1/5  Minumum of 1-2 x/day, 30 to 90 minutes  Dysphagia/aspiration precaution training;Patient/family education    Pain Pain Assessment Pain Scale: 0-10 Pain Score: 0-No pain  Prior Functioning Type of Home: House  Lives With: Son Available Help at Discharge: Family;Available 24 hours/day  SLP Evaluation Cognition Overall Cognitive Status: Within Functional Limits for tasks assessed Arousal/Alertness: Awake/alert Orientation Level: Oriented X4  Comprehension Auditory Comprehension Overall Auditory Comprehension: Appears within functional limits for tasks assessed Yes/No Questions: Within  Functional Limits Commands: Within Functional Limits Expression Expression Primary Mode of Expression: Verbal Verbal Expression Overall Verbal Expression: Appears within functional limits for tasks assessed Oral Motor Oral Motor/Sensory Function Overall Oral Motor/Sensory Function: Within functional limits Motor Speech Overall Motor Speech: Appears within functional limits for tasks assessed  Care Tool Care Tool Cognition Ability to hear (with hearing aid or hearing appliances if normally used Ability to hear (with hearing aid or hearing appliances if normally used): 0. Adequate - no difficulty in normal conservation, social interaction, listening to TV   Expression of Ideas and Wants Expression of Ideas and Wants: 4. Without difficulty (complex and basic) - expresses complex messages without difficulty and with speech that is clear and easy to understand   Understanding Verbal and Non-Verbal Content Understanding Verbal and Non-Verbal Content: 4. Understands (complex and basic) - clear comprehension without cues or repetitions  Memory/Recall Ability Memory/Recall Ability : Current season;Location of own room;Staff names and faces;That he or she is in a hospital/hospital unit   Bedside Swallowing Assessment General Date of Onset: 08/16/22 Previous Swallow Assessment: none - cough with concerns for aspiration Diet Prior to this Study: Regular;Thin liquids Temperature Spikes Noted: No Respiratory Status: Room air Behavior/Cognition: Alert;Cooperative;Pleasant mood Oral Cavity - Dentition: Adequate natural dentition;Missing dentition Self-Feeding Abilities: Able to feed self Patient Positioning: Upright in bed Baseline Vocal Quality: Normal Volitional Cough: Strong Volitional Swallow: Able to elicit  Oral Care Assessment Oral Assessment  (WDL): Exceptions to WDL Lips: Symmetrical Teeth: Poor dental hygiene Tongue: Pink;Moist Mucous Membrane(s): Moist;Pink Saliva: Moist, saliva  free flowing Level of Consciousness: Alert Is patient on any of following O2 devices?: None of the above Nutritional status: No high risk factors Oral Assessment Risk : Low Risk Ice Chips Ice chips: Not tested Thin Liquid Thin Liquid: Within functional limits Presentation: Cup Nectar Thick Nectar Thick Liquid: Not tested Honey Thick Honey Thick Liquid: Not tested Puree Puree: Within functional limits Presentation: Self Fed;Spoon Solid Solid: Impaired Presentation: Self Fed Oral Phase Functional Implications: Oral residue BSE Assessment Risk for Aspiration Other Related Risk Factors: Deconditioning  Short Term Goals: Week 1: SLP Short Term Goal 1 (Week 1): STG = LTG d/t ELOS  Refer to Care Plan for Long Term Goals  Recommendations for other services: None   Discharge Criteria: Patient will be discharged from SLP if patient refuses treatment 3 consecutive times without medical reason, if treatment goals not met, if there is a change in medical status, if patient makes no progress towards goals or if patient is discharged from hospital.  The above assessment, treatment plan, treatment alternatives and goals were discussed and mutually agreed upon: by patient  Dewaine Conger 08/17/2022, 7:59 AM

## 2022-08-17 NOTE — Progress Notes (Signed)
[facility] Progress Note   Subjective:   Pt reports episode of emesis this AM, he thinks because he took all of his AM meds at once. Feels ok now. Denies SOB, CP, dizziness and nausea.  Objective Vitals:   08/16/22 1937 08/16/22 2144 08/17/22 0332 08/17/22 0523  BP: 98/67 94/61 (!) 118/58   Pulse: 92 69 94   Resp: '18 19 19   '$ Temp: 99 F (37.2 C) 98.7 F (37.1 C) 97.8 F (36.6 C)   TempSrc: Oral Oral Oral   SpO2: (!) 89% 95% 96%   Weight:    78 kg  Height:       Physical Exam General: Alert male in NAD Heart: RRR, no murmurs, rubs or gallops Lungs: CTA bilaterally without wheezing, rhonchi or rales Abdomen: Soft, non-distended, +BS Extremities: No edema b/l lower extremities Dialysis Access:  [facility]  Additional Objective Labs: Basic Metabolic Panel: Recent Labs  Lab 08/13/22 0919  NA 138  K 4.5  CL 96*  CO2 20*  GLUCOSE 145*  BUN 78*  CREATININE 7.96*  CALCIUM 8.7*  PHOS 5.7*   Liver Function Tests: Recent Labs  Lab 08/13/22 0919  ALBUMIN 2.5*   No results for input(s): "LIPASE", "AMYLASE" in the last 168 hours. CBC: Recent Labs  Lab 08/13/22 0919  WBC 13.2*  HGB 9.5*  HCT 31.2*  MCV 96.0  PLT 217   Blood Culture    Component Value Date/Time   SDES BLOOD RIGHT ARM 08/05/2022 2105   SDES BLOOD RIGHT ARM 08/05/2022 2105   SPECREQUEST  08/05/2022 2105    BOTTLES DRAWN AEROBIC AND ANAEROBIC Blood Culture adequate volume   SPECREQUEST  08/05/2022 2105    BOTTLES DRAWN AEROBIC AND ANAEROBIC Blood Culture adequate volume   CULT  08/05/2022 2105    NO GROWTH 5 DAYS Performed at [facility], [place] [address] [place], [place] [address]    CULT  08/05/2022 2105    NO GROWTH 5 DAYS Performed at [facility] [address], [place], [place] [address]    REPTSTATUS 08/10/2022 FINAL 08/05/2022 2105   REPTSTATUS 08/10/2022 FINAL 08/05/2022 2105    Cardiac Enzymes: No results for input(s): "CKTOTAL", "CKMB",  "CKMBINDEX", "TROPONINI" in the last 168 hours. CBG: Recent Labs  Lab 08/16/22 0612 08/16/22 1205 08/16/22 1647 08/16/22 2131 08/17/22 0516  GLUCAP 172* 159* 152* 135* 147*   Iron Studies: No results for input(s): "IRON", "TIBC", "TRANSFERRIN", "FERRITIN" in the last 72 hours. '@lablastinr3'$ @ Studies/Results: DG Chest 2 View  Result Date: 08/16/2022 CLINICAL DATA:  Cough EXAM: CHEST - 2 VIEW COMPARISON:  Previous studies including the examination of 08/05/2022 FINDINGS: Transverse diameter of heart is increased. Possible coronary artery calcifications are seen. Central pulmonary vessels are prominent. There are no signs of alveolar pulmonary edema or new focal infiltrates. There is no pleural effusion or pneumothorax. Tip of dialysis catheter is seen at the junction of superior vena cava and right atrium. IMPRESSION: Cardiomegaly. Central pulmonary vessels are prominent suggesting mild CHF. There are no signs of alveolar pulmonary edema or focal pulmonary consolidation. Electronically Signed   By: Elmer Picker M.D.   On: 08/16/2022 19:19   Medications:   vitamin C  1,000 mg Oral Daily   aspirin EC  81 mg Oral Daily   B-complex with vitamin C  1 tablet Oral Daily   Chlorhexidine Gluconate Cloth  6 each Topical Q12H   clopidogrel  75 mg Oral Daily   gabapentin  100  mg Oral TID   heparin  5,000 Units Subcutaneous Q12H   hydrocortisone cream   Topical BID   insulin aspart  0-9 Units Subcutaneous TID WC   linaclotide  72 mcg Oral q AM   linagliptin  5 mg Oral Daily   midodrine  10 mg Oral TID WC   nutrition supplement (JUVEN)  1 packet Oral BID BM   pantoprazole  40 mg Oral Daily   sodium bicarbonate  650 mg Oral BID   sucroferric oxyhydroxide  1,000 mg Oral BID WC   zinc sulfate  220 mg Oral Daily    Outpatient Dialysis Orders: [facility]  4.5h  425/ 700   89kg  2/2 bath  TDC  Hep 8000+ 2016md - mircera 1531m q2, last 12/16, due 12/30 - venofer '50mg'$  IV weekly - hectorol  1021mIV TIW - korsuva for itching  - velphoro 3 tabs with meals- last phos 12.5  Assessment/Plan: L foot wound - S/P L AKA per Dr. DudSharol Given/20. Now in CIR for rehab. ESRD -  TTS HD. OP Heparin dose too high for current wt. Lower dose on discharge.  Hypotension/volume: BP remains soft, has hx of hypotension at OP Clinic as well. Pain medication frequency decreased 08/14/2022 and again 12/29. Started midodrine here at 10 mg tid. Euvolemic and several kg under EDW. Will need lower EDW on discharge post AKA. No Antihypertensive meds.  Anemia: Hgb 9.5. ESA due 08/17/2022, ordered aranesp.   Metabolic bone disease: Calcium elevated, holding hectorol for now.  Resumed binders. PO4 5.7   Nutrition:  Renal diet. Very low albumin. Add protein supps.  Abnormal LFT's - RUQ US [place]owed cholelithiasis and gallbladder wall thickening.  Negative US [place] sign, acute hepatitis panel negative.  Per primary.  Bradycardia: Resolved Hyperkalemia:Resolved.      SamAnice PaganiniA-C 08/17/2022, 10:25 AM  [facility] Pager: [phone]

## 2022-08-17 NOTE — Progress Notes (Addendum)
PROGRESS NOTE   Subjective/Complaints:  Pt had "pain episode" this AM- got pain meds but vomited prior to that - per nurse 5 minutes after receiving PPI, etc- asked nurse to give meds again that he vomited up.   Asked to prop L AKA up- explained/educated that we can for a short time, but not to do regularly, because can get in way of healing/develop contracture.    ROS:  Pt denies SOB, abd pain, CP, N/V/C/D, and vision changes    Objective:   DG Chest 2 View  Result Date: 08/16/2022 CLINICAL DATA:  Cough EXAM: CHEST - 2 VIEW COMPARISON:  Previous studies including the examination of 08/05/2022 FINDINGS: Transverse diameter of heart is increased. Possible coronary artery calcifications are seen. Central pulmonary vessels are prominent. There are no signs of alveolar pulmonary edema or new focal infiltrates. There is no pleural effusion or pneumothorax. Tip of dialysis catheter is seen at the junction of superior vena cava and right atrium. IMPRESSION: Cardiomegaly. Central pulmonary vessels are prominent suggesting mild CHF. There are no signs of alveolar pulmonary edema or focal pulmonary consolidation. Electronically Signed   By: Ernie Avena M.D.   On: 08/16/2022 19:19   No results for input(s): "WBC", "HGB", "HCT", "PLT" in the last 72 hours.  No results for input(s): "NA", "K", "CL", "CO2", "GLUCOSE", "BUN", "CREATININE", "CALCIUM" in the last 72 hours.   Intake/Output Summary (Last 24 hours) at 08/17/2022 1428 Last data filed at 08/17/2022 0816 Gross per 24 hour  Intake 220 ml  Output --  Net 220 ml        Physical Exam: Vital Signs Blood pressure 99/65, pulse 92, temperature (!) 97.2 F (36.2 C), resp. rate 16, height 3' 11.24" (1.2 m), weight 78 kg, SpO2 94 %.   General: awake, alert, appropriate, NAD HENT: conjugate gaze; oropharynx moist CV: regular rate; no JVD Pulmonary: CTA B/L; no W/R/R- good air  movement GI: soft, NT, ND, (+)BS Psychiatric: appropriate- smiling some Neurological: Ox3 Skin: wound vac removed, amputated fingers- Healed R AKA and wrapped with ACE wrap L AKA- which is new Neuro: Alert and oriented to person and place.  Knows December but does not know year or day.  Follows commands.  Cranial nerves II through XII intact. Sensation to light touch intact in all 4 extremities Strength 5 out of 5 bilateral shoulder abduction, elbow flexion and extension Able to lift bilateral lower extremities Musculoskeletal: Patient with multiple finger amputations.  He has amputations of right second, third and fifth digits at MCP and fourth digit PIP.  He has amputations of the left second and fourth digits.  Left third and fifth digits with flexion contractures.  Right AKA is well-healed.  Left AKA with wound VAC dressing in place draining serosanguineous fluid.  Appropriately tender  No joint swelling noted   Right IJ TDC Right upper extremity IV   Assessment/Plan: 1. Functional deficits which require 3+ hours per day of interdisciplinary therapy in a comprehensive inpatient rehab setting. Physiatrist is providing close team supervision and 24 hour management of active medical problems listed below. Physiatrist and rehab team continue to assess barriers to discharge/monitor patient progress toward functional and medical  goals  Care Tool:  Bathing    Body parts bathed by patient: Right arm, Left arm, Chest, Abdomen, Left upper leg, Right lower leg, Face   Body parts bathed by helper: Buttocks, Right upper leg, Left upper leg Body parts n/a: Left lower leg, Right lower leg   Bathing assist Assist Level: Supervision/Verbal cueing     Upper Body Dressing/Undressing Upper body dressing   What is the patient wearing?: Pull over shirt    Upper body assist Assist Level: Minimal Assistance - Patient > 75%    Lower Body Dressing/Undressing Lower body dressing      What is the  patient wearing?: Pants     Lower body assist Assist for lower body dressing: Dependent - Patient 0%     Toileting Toileting Toileting Activity did not occur (Clothing management and hygiene only): N/A (no void or bm)  Toileting assist Assist for toileting: Total Assistance - Patient < 25%     Transfers Chair/bed transfer  Transfers assist  Chair/bed transfer activity did not occur: Safety/medical concerns  Chair/bed transfer assist level: 2 Helpers     Locomotion Ambulation   Ambulation assist   Ambulation activity did not occur: Safety/medical concerns (bilateral AKA, pain)          Walk 10 feet activity   Assist  Walk 10 feet activity did not occur: Safety/medical concerns (bilateral AKA, pain)        Walk 50 feet activity   Assist Walk 50 feet with 2 turns activity did not occur: Safety/medical concerns (bilateral AKA, pain)         Walk 150 feet activity   Assist Walk 150 feet activity did not occur: Safety/medical concerns (bilateral AKA, pain)         Walk 10 feet on uneven surface  activity   Assist Walk 10 feet on uneven surfaces activity did not occur: Safety/medical concerns (bilateral AKA, pain)         Wheelchair     Assist Is the patient using a wheelchair?: Yes Type of Wheelchair: Manual Wheelchair activity did not occur: Safety/medical concerns (multiple digit amputations, bilateral AKA)         Wheelchair 50 feet with 2 turns activity    Assist    Wheelchair 50 feet with 2 turns activity did not occur: Safety/medical concerns (multiple digit amputations, bilateral AKA)       Wheelchair 150 feet activity     Assist  Wheelchair 150 feet activity did not occur: Safety/medical concerns (multiple digit amputations, bilateral AKA)       Blood pressure 99/65, pulse 92, temperature (!) 97.2 F (36.2 C), resp. rate 16, height 3' 11.24" (1.2 m), weight 78 kg, SpO2 94 %.  Medical Problem List and  Plan: 1. Functional deficits secondary to left AKA 08/05/2022 due to gangrenous changes.  Status post wound VAC.  History of right TKA 01/08/2022             -patient may shower             -ELOS/Goals: 12-14 days , PT/OT min assist              Con't CIR- PT, and OT- asked nursing to give meds that were vomited up- pt said was acid/took meds too fast. 2. Impaired mobility: order power wheelchair -DVT/anticoagulation:  Pharmaceutical: Heparin             -antiplatelet therapy: Aspirin 81 mg daily and Plavix 75 mg daily 3. Pain Management:  Neurontin 100 mg 3 times daily, oxycodone as needed. Add vitamin C 4. Anxiety: Xanax d/ced given hypotension 5. Neuropsych/cognition: This patient is capable of making decisions on his own behalf. 6. Residual limb incision: remove wound vac 7. Fluids/Electrolytes/Nutrition: Routine in and outs with follow-up chemistries 8.  End-stage renal disease.  Hemodialysis as directed per Nephrology. TTS schedule 9.  Acute on chronic anemia. Last HGB 9.5.  Follow-up CBC 10.  Diabetes mellitus.  Hemoglobin A1c 6.3.  Tradjenta 5 mg daily/SSI. CBGs well controlled 11.  Chronic orthostasis.  ProAmatine 10 mg 3 times daily.  Monitor with increased mobility. Monitor fluid intake. 12.  Constipation.  Linzess daily and MiraLAX as needed. LBM 12/26  12/30- LBM yesterday 13.  GERD.  Protonix 14.  Chronic combined CHF.  Ejection fraction 45%.  Monitor for any signs of fluid overload. Daily weights  12/30- up 4+ kg in 3 days- will let renal know.  15. Hyperkalemia. Resolved. continue HD per nephrology. 16. Acute transaminitis and cholelithiasis. monitor LFTs.  12/30- resolved 17. Hypotension: d/c xanax, advised to only give additional midodrine if symptomatic.  18. Pruritus: hydrocortisone ordered 19. Loose stool: d/c nepro 20. Cough: CXR ordered. SLP ordered  12/30- shows some mild congestion/CHF- since on HD, will ask renal to help with this.    I spent a total of54    minutes on total care today- >50% coordination of care- due to d/w nursing and left message for renal about pulmonary congestion/weight gain.  Also called Ortho about drainage from L AKA    Will call Ortho about plan- to see who's covering for Dr Lajoyce Corners.    LOS: 4 days A FACE TO FACE EVALUATION WAS PERFORMED  Mikiala Fugett 08/17/2022, 2:28 PM

## 2022-08-18 DIAGNOSIS — Z992 Dependence on renal dialysis: Secondary | ICD-10-CM | POA: Diagnosis not present

## 2022-08-18 DIAGNOSIS — N186 End stage renal disease: Secondary | ICD-10-CM | POA: Diagnosis not present

## 2022-08-18 DIAGNOSIS — E1129 Type 2 diabetes mellitus with other diabetic kidney complication: Secondary | ICD-10-CM | POA: Diagnosis not present

## 2022-08-18 DIAGNOSIS — Z89611 Acquired absence of right leg above knee: Secondary | ICD-10-CM | POA: Diagnosis not present

## 2022-08-18 DIAGNOSIS — Z89612 Acquired absence of left leg above knee: Secondary | ICD-10-CM | POA: Diagnosis not present

## 2022-08-18 LAB — GLUCOSE, CAPILLARY
Glucose-Capillary: 137 mg/dL — ABNORMAL HIGH (ref 70–99)
Glucose-Capillary: 126 mg/dL — ABNORMAL HIGH (ref 70–99)
Glucose-Capillary: 145 mg/dL — ABNORMAL HIGH (ref 70–99)
Glucose-Capillary: 126 mg/dL — ABNORMAL HIGH (ref 70–99)

## 2022-08-18 MED ORDER — CEFAZOLIN SODIUM-DEXTROSE 1-4 GM/50ML-% IV SOLN
1.0000 g | INTRAVENOUS | Status: DC
  Administered 2022-08-18 – 2022-08-22 (×5): 1 g via INTRAVENOUS
  Filled 2022-08-18 (×6): qty 50

## 2022-08-18 MED ORDER — CHLORHEXIDINE GLUCONATE CLOTH 2 % EX PADS
6.0000 | MEDICATED_PAD | Freq: Every day | CUTANEOUS | Status: DC
  Administered 2022-08-18 – 2022-08-22 (×5): 6 via TOPICAL

## 2022-08-18 NOTE — Progress Notes (Signed)
Occupational Therapy Session Note  Patient Details  Name: Edward [name] MRN: [id number] Date of Birth: [birth year]  Today's Date: 08/18/2022 OT Individual Time: 0215-0315 OT Individual Time Calculation (min): 60 min    Short Term Goals: Week 1:  OT Short Term Goal 1 (Week 1): Patient will increased LB dressing at bed level to Max A. OT Short Term Goal 2 (Week 1): Patient will demonstrate improved functional performance during toilet transfers utilizing most appropriate technique with Mod A x1.  Skilled Therapeutic Interventions/Progress Updates:    Patient was seen this date for skilled OT and family education regarding BADL related task  performance and environmental adaptations to improve functional outcome in the home to reduce the burden of car for the care provider.  The pt was able to demonstrate bed mobility using modified technique with bed chuck in place for greater ease using his arm for grasping the bed rail with ModA.  The pt completed long sitting exercises, maintain position for a count of 10 to aid with core strength.  The pt was able to demonstrate the side lying to reduce his risk for skin breakdown.   AE was introduced,  a hand mit for bathing, I also discussed possible grab bar and functional transfers using a sliding board. The pt was able to demonstrate functional transfers from supine to EOB with ModA and additional time.  Additional instruction was provided regarding dressing, residual limb care, and home modification with hand out provided by PT. The pt remained at bed LOF with family present to address additional needs.   Therapy Documentation Precautions:  Precautions Precautions: Fall Precaution Comments: previous R AKA, multiple digit amputations Restrictions Weight Bearing Restrictions: Yes RUE Weight Bearing: Weight bearing as tolerated RLE Weight Bearing:  (Bilateral AKA) LLE Weight Bearing: Non weight bearing   Therapy/Group: Individual Therapy  Yvonne Kendall 08/18/2022, 3:48 PM

## 2022-08-18 NOTE — Progress Notes (Addendum)
PROGRESS NOTE   Subjective/Complaints:  Pt reports doing OK- but has a cough "that won't stop".   Based on CXR 12/29- late in day, has mild CHF exacerbation- will call renal to discuss. Weight also up- ~ 2.5-3 kg from 2-3 days ago.  Was up more yesterday, however down 2 kg today compared to yesterday.   Says they took off 1 L today- also same as PA-C said.   They will look into albumin with HD and see if can get more fluid off him next HD- BP was low all dialysis today.    Cough, we both agree is due to fluid overload- no easy treatments for this except getting fluid off.   Small BM this AM   ROS:   Pt denies SOB, abd pain, CP,  (denied Nausea, but vomited this AM)(+)  N/V/C/D, and vision changes   Objective:   DG Chest 2 View  Result Date: 08/16/2022 CLINICAL DATA:  Cough EXAM: CHEST - 2 VIEW COMPARISON:  Previous studies including the examination of 08/05/2022 FINDINGS: Transverse diameter of heart is increased. Possible coronary artery calcifications are seen. Central pulmonary vessels are prominent. There are no signs of alveolar pulmonary edema or new focal infiltrates. There is no pleural effusion or pneumothorax. Tip of dialysis catheter is seen at the junction of superior vena cava and right atrium. IMPRESSION: Cardiomegaly. Central pulmonary vessels are prominent suggesting mild CHF. There are no signs of alveolar pulmonary edema or focal pulmonary consolidation. Electronically Signed   By: Elmer Picker M.D.   On: 08/16/2022 19:19   No results for input(s): "WBC", "HGB", "HCT", "PLT" in the last 72 hours.  No results for input(s): "NA", "K", "CL", "CO2", "GLUCOSE", "BUN", "CREATININE", "CALCIUM" in the last 72 hours.   Intake/Output Summary (Last 24 hours) at 08/18/2022 1052 Last data filed at 08/18/2022 0840 Gross per 24 hour  Intake 0 ml  Output 1000 ml  Net -1000 ml        Physical Exam: Vital  Signs Blood pressure 102/76, pulse 90, temperature (!) 97 F (36.1 C), temperature source Oral, resp. rate 17, height 3' 11.24" (1.2 m), weight 76.2 kg, SpO2 94 %.    General: awake, alert, appropriate, laying in bed; has 2 Emesis bag sin bed, but denies Nausea; NAD HENT: conjugate gaze; oropharynx moist- says eyelid swelling is "normal for him" when looked at phone to see image CV: regular rate; no JVD Pulmonary: CTA B/L; no W/R/R- good air movement GI: soft, NT, ND, (+)BS Psychiatric: appropriate- a little flat, wandering slightly, but got back on topic easily Neurological: alert- STM a little decreased/noted Skin: wound vac removed, amputated fingers- Healed R AKA and wrapped with ACE wrap L AKA- which is new Fistula L upper arm Neuro: Alert and oriented to person and place.  Knows December but does not know year or day.  Follows commands.  Cranial nerves II through XII intact. Sensation to light touch intact in all 4 extremities Strength 5 out of 5 bilateral shoulder abduction, elbow flexion and extension Able to lift bilateral lower extremities Musculoskeletal: Patient with multiple finger amputations.  He has amputations of right second, third and fifth digits at  MCP and fourth digit PIP.  He has amputations of the left second and fourth digits.  Left third and fifth digits with flexion contractures.  Right AKA is well-healed.  Left AKA with wound VAC dressing in place draining serosanguineous fluid.  Appropriately tender  No joint swelling noted   Right IJ TDC Right upper extremity IV   Assessment/Plan: 1. Functional deficits which require 3+ hours per day of interdisciplinary therapy in a comprehensive inpatient rehab setting. Physiatrist is providing close team supervision and 24 hour management of active medical problems listed below. Physiatrist and rehab team continue to assess barriers to discharge/monitor patient progress toward functional and medical goals  Care  Tool:  Bathing    Body parts bathed by patient: Right arm, Left arm, Chest, Abdomen, Left upper leg, Right lower leg, Face   Body parts bathed by helper: Buttocks, Right upper leg, Left upper leg Body parts n/a: Left lower leg, Right lower leg   Bathing assist Assist Level: Supervision/Verbal cueing     Upper Body Dressing/Undressing Upper body dressing   What is the patient wearing?: Pull over shirt    Upper body assist Assist Level: Minimal Assistance - Patient > 75%    Lower Body Dressing/Undressing Lower body dressing      What is the patient wearing?: Pants     Lower body assist Assist for lower body dressing: Dependent - Patient 0%     Toileting Toileting Toileting Activity did not occur (Clothing management and hygiene only): N/A (no void or bm)  Toileting assist Assist for toileting: Total Assistance - Patient < 25%     Transfers Chair/bed transfer  Transfers assist  Chair/bed transfer activity did not occur: Safety/medical concerns  Chair/bed transfer assist level: 2 Helpers     Locomotion Ambulation   Ambulation assist   Ambulation activity did not occur: Safety/medical concerns (bilateral AKA, pain)          Walk 10 feet activity   Assist  Walk 10 feet activity did not occur: Safety/medical concerns (bilateral AKA, pain)        Walk 50 feet activity   Assist Walk 50 feet with 2 turns activity did not occur: Safety/medical concerns (bilateral AKA, pain)         Walk 150 feet activity   Assist Walk 150 feet activity did not occur: Safety/medical concerns (bilateral AKA, pain)         Walk 10 feet on uneven surface  activity   Assist Walk 10 feet on uneven surfaces activity did not occur: Safety/medical concerns (bilateral AKA, pain)         Wheelchair     Assist Is the patient using a wheelchair?: Yes Type of Wheelchair: Manual Wheelchair activity did not occur: Safety/medical concerns (multiple digit  amputations, bilateral AKA)         Wheelchair 50 feet with 2 turns activity    Assist    Wheelchair 50 feet with 2 turns activity did not occur: Safety/medical concerns (multiple digit amputations, bilateral AKA)       Wheelchair 150 feet activity     Assist  Wheelchair 150 feet activity did not occur: Safety/medical concerns (multiple digit amputations, bilateral AKA)       Blood pressure 102/76, pulse 90, temperature (!) 97 F (36.1 C), temperature source Oral, resp. rate 17, height 3' 11.24" (1.2 m), weight 76.2 kg, SpO2 94 %.  Medical Problem List and Plan: 1. Functional deficits secondary to left AKA 08/05/2022 due to gangrenous changes.  Status post wound VAC.  History of right TKA 01/08/2022             -patient may shower             -ELOS/Goals: 12-14 days , PT/OT min assist              Con't CIR- PT, and OT-  2. Impaired mobility: order power wheelchair -DVT/anticoagulation:  Pharmaceutical: Heparin             -antiplatelet therapy: Aspirin 81 mg daily and Plavix 75 mg daily 3. Pain Management: Neurontin 100 mg 3 times daily, oxycodone as needed. Add vitamin C  12/31- taking Oxy max 3x/day 5 mg- d/w PA from Renal- they would prefer [place] reducing, but not sure if possible yet. 4. Anxiety: Xanax d/ced given hypotension 5. Neuropsych/cognition: This patient is capable of making decisions on his own behalf. 6. Residual limb incision: remove wound vac  12/31- shrinker ordered 7. Fluids/Electrolytes/Nutrition: Routine in and outs with follow-up chemistries 8.  End-stage renal disease.  Hemodialysis as directed per Nephrology. TTS schedule 9.  Acute on chronic anemia. Last HGB 9.5.  Follow-up CBC 10.  Diabetes mellitus.  Hemoglobin A1c 6.3.  Tradjenta 5 mg daily/SSI. CBGs well controlled  12/31- CBGs controlled- con't regimen 11.  Chronic orthostasis.  ProAmatine 10 mg 3 times daily.  Monitor with increased mobility. Monitor fluid intake. 12.  Constipation.   Linzess daily and MiraLAX as needed. LBM 12/26  12/30- LBM yesterday   12/31- BM this AM- small 13.  GERD.  Protonix 14.  Chronic combined CHF.  Ejection fraction 45%.  Monitor for any signs of fluid overload. Daily weights  12/30- up 4+ kg in 3 days- will let renal know.   12/31- d/w renal- will do HD with albumin Tuesday- got HD today due to conflicts in dialysis.  15. Hyperkalemia. Resolved. continue HD per nephrology. 16. Acute transaminitis and cholelithiasis. monitor LFTs.  12/30- resolved 17. Hypotension: d/c xanax, advised to only give additional midodrine if symptomatic.  18. Pruritus: hydrocortisone ordered 19. Loose stool: d/c nepro 20. Cough: CXR ordered. SLP ordered  12/30- shows some mild congestion/CHF- since on HD, will ask renal to help with this. 12/31- no treatment since fluid overload, but BP too low to take more fluid off easily-    21. L AKA possible infection- with rising CBC- diff-will recheck in AM   I spent a total of 54   minutes on total care today- >50% coordination of care- due to prolonged d/w pt as well as renal PA.  Also d/w Dr Sharol Given- will add Ancef for L AKA possible infection- now and with HD- some purulence drainage- pictured below-      LOS: 5 days A FACE TO FACE EVALUATION WAS PERFORMED  Juwana Thoreson 08/18/2022, 10:52 AM

## 2022-08-18 NOTE — Progress Notes (Signed)
Physical Therapy Session Note  Patient Details  Name: Edward [name] MRN: [id number] Date of Birth: 27-Sep-1970  Today's Date: 08/18/2022 PT Individual Time: 1100-1145 PT Individual Time Calculation (min): 45 min   Short Term Goals: Week 1:  PT Short Term Goal 1 (Week 1): pt will perform bed mobility with mod A of 1 PT Short Term Goal 2 (Week 1): pt will transfer bed<>chair with LRAD and min A of 1 PT Short Term Goal 3 (Week 1): pt will particpate in WC propulsion up to 32f  Skilled Therapeutic Interventions/Progress Updates:     Session 1: Patient in bed upon PT arrival. Patient reported 8/10 L residual limb pain during session, RN made aware. PT provided repositioning, rest breaks, and distraction as pain interventions throughout session. Noted ACE wrap baned around the top of the limb. Removed ACE wrap to reveal tan/brown purulent and serosanguineous drainage from lateral side of the wound on the gauze. Noted significant TTP around lateral edge of the wound. RN and MD made aware and assessed incision during session.   Cleared any additional drainage with gauze, none present at this time. Applied Kerlix and ACE wrap to residual limb with total A and education on wrapping technique throughout. Attempted to apply shrinker sock over Kerlix, however, patient's pain level too hight to tolerate at this time.   Patient in bed at end of session with breaks locked, bed alarm set, and all needs within reach. Patient missed 15 min of skilled PT due to pain, RN made aware. Will attempt to make-up missed time as able.    Session 2: Patient in bed with his 2 sons and sister at bedside upon PT arrival. Patient alert and agreeable to PT session. Patient reported 4-5/10 L residual limb pain during session, RN made aware. PT provided repositioning, rest breaks, and distraction as pain interventions throughout session.   Patient's family present for family education throughout session. Educated on patient's  POC, PT goals, caregiver expectations, home modifications (ramp, doorway access, w/c accessible transport), and general amputee education. Patient's son's report that they feel capable of provided the level of assist the patient will need for transfers and mobility at d/c. They do have concerns about bathing/toileting. Patient's sister requested a HHA, reports the patient has had one before, and that this would greatly reduce the burden of care on his 2 sons.   Provided family with home measurement sheet to return to lead PT (email provided).   Educated on power wheelchair features and limitations to transport without w/c accessible vehicle. Interested in attaining [facility] or medical transport for dialysis appointments.   Family asking about patient wearing his prosthesis, per notes, unable to determine if patient had worn his prosthetic during his stay. Educated on limited functional use of single limb above knee prosthetic due to mechanical disadvantage, however possible assist with transfers, sitting balance and aesthetic.   Therapeutic Activity: Bed Mobility: Patient performed rolling R/L x2 with min-mod A due to incontinent BM. Peri care and lower body clothing management performed with total A with patient holding onto the bed rail for support to sustain side-lying. He also performed rolling R with min A at end of session to have bed pan placed for BM. He performed supine to/from long sit with mod A with use of bed rails and elevated HOB to reduce nausea. Provided verbal cues for  hand placement, tucking chin and forward trunk momentum to come to sitting. Remained sitting up in bed >5 min focused on posture  due to significant posterior pelvic tilt and rounded shoulders, and hand placement behind his hips for improved postural control.   Patient in bed on the bed pan with family outside the room at end of session with breaks locked, bed alarm set, and all needs within reach.   Therapy  Documentation Precautions:  Precautions Precautions: Fall Precaution Comments: previous R AKA, multiple digit amputations Restrictions Weight Bearing Restrictions: Yes RUE Weight Bearing: Weight bearing as tolerated RLE Weight Bearing:  (Bilateral AKA) LLE Weight Bearing: Non weight bearing    Therapy/Group: Individual Therapy  Greggory Safranek L Branndon Tuite PT, DPT, NCS, CBIS  08/18/2022, 2:14 PM

## 2022-08-18 NOTE — Progress Notes (Signed)
[facility] Progress Note   Subjective:   Noted patient with vomiting again this AM. Seen on dialysis, denies any nausea but seems a bit confused, says he has not had dialysis since his surgery. Reviewed chart and he has not missed any dialysis treatments since admission as far as I can see. A&O x3, says he may be losing track of his days.   Reports nasal congestion and cough. Denies SOB, CP, dizziness. Having some pulse ox issues but O2 sat up to 100% on RA once readjusted. UF has been limited by hypotension.  Objective Vitals:   08/18/22 0700 08/18/22 0730 08/18/22 0800 08/18/22 0830  BP: 99/73 99/74 104/69 99/75  Pulse: 91 86 89 (!) 107  Resp: (!) 26 (!) 23 (!) 23 11  Temp:      TempSrc:      SpO2: 100% 98% 100% (!) 68%  Weight:      Height:       Physical Exam General: Alert male in NAD Heart: RRR, no murmurs, rubs or gallops Lungs: CTA bilaterally without wheezing, rhonchi or rales Abdomen: Soft, non-distended, +BS Extremities: B/l AKA, no edema noted.  Dialysis Access: [facility] accessed.   Additional Objective Labs: Basic Metabolic Panel: Recent Labs  Lab 08/13/22 0919  NA 138  K 4.5  CL 96*  CO2 20*  GLUCOSE 145*  BUN 78*  CREATININE 7.96*  CALCIUM 8.7*  PHOS 5.7*   Liver Function Tests: Recent Labs  Lab 08/13/22 0919  ALBUMIN 2.5*   No results for input(s): "LIPASE", "AMYLASE" in the last 168 hours. CBC: Recent Labs  Lab 08/13/22 0919  WBC 13.2*  HGB 9.5*  HCT 31.2*  MCV 96.0  PLT 217   Blood Culture    Component Value Date/Time   SDES BLOOD RIGHT ARM 08/05/2022 2105   SDES BLOOD RIGHT ARM 08/05/2022 2105   SPECREQUEST  08/05/2022 2105    BOTTLES DRAWN AEROBIC AND ANAEROBIC Blood Culture adequate volume   SPECREQUEST  08/05/2022 2105    BOTTLES DRAWN AEROBIC AND ANAEROBIC Blood Culture adequate volume   CULT  08/05/2022 2105    NO GROWTH 5 DAYS Performed at [facility], [place] [address], [place], [place] [address]     CULT  08/05/2022 2105    NO GROWTH 5 DAYS Performed at [facility] [address] [place], [place] [address]    REPTSTATUS 08/10/2022 FINAL 08/05/2022 2105   REPTSTATUS 08/10/2022 FINAL 08/05/2022 2105    Cardiac Enzymes: No results for input(s): "CKTOTAL", "CKMB", "CKMBINDEX", "TROPONINI" in the last 168 hours. CBG: Recent Labs  Lab 08/16/22 2131 08/17/22 0516 08/17/22 1150 08/17/22 1628 08/17/22 2139  GLUCAP 135* 147* 166* 168* 121*   Iron Studies: No results for input(s): "IRON", "TIBC", "TRANSFERRIN", "FERRITIN" in the last 72 hours. '@lablastinr3'$ @ Studies/Results: DG Chest 2 View  Result Date: 08/16/2022 CLINICAL DATA:  Cough EXAM: CHEST - 2 VIEW COMPARISON:  Previous studies including the examination of 08/05/2022 FINDINGS: Transverse diameter of heart is increased. Possible coronary artery calcifications are seen. Central pulmonary vessels are prominent. There are no signs of alveolar pulmonary edema or new focal infiltrates. There is no pleural effusion or pneumothorax. Tip of dialysis catheter is seen at the junction of superior vena cava and right atrium. IMPRESSION: Cardiomegaly. Central pulmonary vessels are prominent suggesting mild CHF. There are no signs of alveolar pulmonary edema or focal pulmonary consolidation. Electronically Signed   By: Elmer Picker M.D.   On: 08/16/2022 19:19  Medications:   vitamin C  1,000 mg Oral Daily   aspirin EC  81 mg Oral Daily   B-complex with vitamin C  1 tablet Oral Daily   Chlorhexidine Gluconate Cloth  6 each Topical Q12H   clopidogrel  75 mg Oral Daily   darbepoetin (ARANESP) injection - DIALYSIS  100 mcg Subcutaneous Q Sat-1800   gabapentin  100 mg Oral TID   heparin  5,000 Units Subcutaneous Q12H   hydrocortisone cream   Topical BID   insulin aspart  0-9 Units Subcutaneous TID WC   linaclotide  72 mcg Oral q AM   linagliptin  5 mg Oral Daily   midodrine  10 mg Oral TID WC   nutrition supplement (JUVEN)  1  packet Oral BID BM   pantoprazole  40 mg Oral Daily   sodium bicarbonate  650 mg Oral BID   sucroferric oxyhydroxide  1,000 mg Oral BID WC   zinc sulfate  220 mg Oral Daily    Outpatient Dialysis Orders: [facility]  4.5h  425/ 700   89kg  2/2 bath  TDC  Hep 8000+ 2044md - mircera 1520m q2, last 12/16, due 12/30 - venofer '50mg'$  IV weekly - hectorol 1036mIV TIW - korsuva for itching  - velphoro 3 tabs with meals- last phos 12.5  Assessment/Plan: L foot wound - S/P L AKA per Dr. DudSharol Given/20. Now in CIR for rehab. ESRD -  TTS HD. OP Heparin dose too high for current wt. Lower dose on discharge.  Hypotension/volume: BP remains soft, has hx of hypotension at OP Clinic as well. Pain medication frequency decreased 08/14/2022 and again 12/29. Started midodrine here at 10 mg tid. C/o cough, will try to increase UF goal next HD as tolerated. Will need lower EDW on discharge post AKA. No Antihypertensive meds.  Anemia: Hgb 9.5. ESA due 08/17/2022, ordered aranesp.   Metabolic bone disease: Calcium elevated, holding hectorol for now.  Resumed binders. PO4 5.7   Nutrition:  Renal diet. Very low albumin. Add protein supps.  Abnormal LFT's - RUQ US [place] cholelithiasis and gallbladder wall thickening.  Negative US [place] sign, acute hepatitis panel negative.  Per primary.  Bradycardia: Resolved Hyperkalemia:Resolved.   SamAnice PaganiniA-C 08/18/2022, 8:38 AM  [facility] Pager: [phone]

## 2022-08-18 NOTE — Progress Notes (Signed)
Received patient in bed to unit.  Alert and oriented.  Informed consent signed and in chart.   Treatment initiated: 05:24 Treatment completed: 08:40  Patient tolerated well.  Transported back to the room  Alert, without acute distress.  Hand-off given to patient's nurse.   Access used: Right hemodialysis catheter Access issues: none  Total UF removed: 1L Medication(s) given: none     08/18/22 0840  Vitals  Temp (!) 97 F (36.1 C)  Temp Source Oral  BP 97/68  MAP (mmHg) 77  BP Location Right Arm  BP Method Automatic  Patient Position (if appropriate) Lying  Pulse Rate 88  Pulse Rate Source Monitor  ECG Heart Rate 88  Resp 19  Oxygen Therapy  SpO2 91 %  O2 Device Room Air  During Treatment Monitoring  Blood Flow Rate (mL/min) 200 mL/min  Intra-Hemodialysis Comments Tolerated well;Tx completed  Post Treatment  Dialyzer Clearance Lightly streaked  Duration of HD Treatment -hour(s) 3 hour(s)  Hemodialysis Intake (mL) 0 mL  Liters Processed 72  Fluid Removed (mL) 1000 mL  Tolerated HD Treatment Yes  Post-Hemodialysis Comments Pt ran 3 hrs, Uf goal met, tx time lowered d/t high census, pt catheter ran with no complications  Hemodialysis Catheter Right Subclavian  No placement date or time found.   Placed prior to admission: Yes  Orientation: Right  Access Location: Subclavian  Site Condition No complications  Blue Lumen Status Dead end cap in place;Heparin locked  Red Lumen Status Dead end cap in place;Heparin locked  Purple Lumen Status N/A  Catheter fill solution Heparin 1000 units/ml  Catheter fill volume (Arterial) 1.9 cc  Catheter fill volume (Venous) 1.9  Dressing Type Transparent  Dressing Status Antimicrobial disc in place  Drainage Description None  Dressing Change Due 08/22/22  Post treatment catheter status Capped and Clamped      Ayris Carano S [facility]

## 2022-08-18 NOTE — Progress Notes (Signed)
Rn followed up with dialysis 2x for patient's dialysis. No time given for now but they have patient on the list to do dialysis.

## 2022-08-18 NOTE — Progress Notes (Signed)
Patient vomitted x1 this morning. Denies pain or shortness of breath. See flowsheet for vitals. Dialysis also came to pick him up few minutes after. Aranesp moved to today after dialysis per pharmacy.

## 2022-08-19 DIAGNOSIS — Z7189 Other specified counseling: Secondary | ICD-10-CM

## 2022-08-19 DIAGNOSIS — Z89611 Acquired absence of right leg above knee: Secondary | ICD-10-CM | POA: Diagnosis not present

## 2022-08-19 DIAGNOSIS — Z515 Encounter for palliative care: Secondary | ICD-10-CM

## 2022-08-19 DIAGNOSIS — Z89612 Acquired absence of left leg above knee: Secondary | ICD-10-CM | POA: Diagnosis not present

## 2022-08-19 LAB — RENAL FUNCTION PANEL
Albumin: 2.5 g/dL — ABNORMAL LOW (ref 3.5–5.0)
Anion gap: 22 — ABNORMAL HIGH (ref 5–15)
BUN: 55 mg/dL — ABNORMAL HIGH (ref 6–20)
CO2: 21 mmol/L — ABNORMAL LOW (ref 22–32)
Calcium: 8.3 mg/dL — ABNORMAL LOW (ref 8.9–10.3)
Chloride: 93 mmol/L — ABNORMAL LOW (ref 98–111)
Creatinine, Ser: 6.74 mg/dL — ABNORMAL HIGH (ref 0.61–1.24)
GFR, Estimated: 9 mL/min — ABNORMAL LOW (ref >60)
Glucose, Bld: 163 mg/dL — ABNORMAL HIGH (ref 70–99)
Phosphorus: 7.1 mg/dL — ABNORMAL HIGH (ref 2.5–4.6)
Potassium: 4 mmol/L (ref 3.5–5.1)
Sodium: 136 mmol/L (ref 135–145)

## 2022-08-19 LAB — CBC WITH DIFFERENTIAL/PLATELET
Abs Immature Granulocytes: 0.08 10*3/uL — ABNORMAL HIGH (ref 0.00–0.07)
Basophils Absolute: 0 10*3/uL (ref 0.0–0.1)
Basophils Relative: 0 %
Eosinophils Absolute: 0.1 10*3/uL (ref 0.0–0.5)
Eosinophils Relative: 1 %
HCT: 34.3 % — ABNORMAL LOW (ref 39.0–52.0)
Hemoglobin: 10.7 g/dL — ABNORMAL LOW (ref 13.0–17.0)
Immature Granulocytes: 1 %
Lymphocytes Relative: 7 %
Lymphs Abs: 0.8 10*3/uL (ref 0.7–4.0)
MCH: 28.9 pg (ref 26.0–34.0)
MCHC: 31.2 g/dL (ref 30.0–36.0)
MCV: 92.7 fL (ref 80.0–100.0)
Monocytes Absolute: 1 10*3/uL (ref 0.1–1.0)
Monocytes Relative: 9 %
Neutro Abs: 8.8 10*3/uL — ABNORMAL HIGH (ref 1.7–7.7)
Neutrophils Relative %: 82 %
Platelets: 333 10*3/uL (ref 150–400)
RBC: 3.7 MIL/uL — ABNORMAL LOW (ref 4.22–5.81)
RDW: 18.3 % — ABNORMAL HIGH (ref 11.5–15.5)
WBC: 10.8 10*3/uL — ABNORMAL HIGH (ref 4.0–10.5)
nRBC: 0.3 % — ABNORMAL HIGH (ref 0.0–0.2)

## 2022-08-19 LAB — GLUCOSE, CAPILLARY
Glucose-Capillary: 156 mg/dL — ABNORMAL HIGH (ref 70–99)
Glucose-Capillary: 157 mg/dL — ABNORMAL HIGH (ref 70–99)
Glucose-Capillary: 188 mg/dL — ABNORMAL HIGH (ref 70–99)
Glucose-Capillary: 219 mg/dL — ABNORMAL HIGH (ref 70–99)

## 2022-08-19 MED ORDER — SUCROFERRIC OXYHYDROXIDE 500 MG PO CHEW
1000.0000 mg | CHEWABLE_TABLET | Freq: Three times a day (TID) | ORAL | Status: DC
  Administered 2022-08-19 – 2022-08-23 (×8): 1000 mg via ORAL
  Filled 2022-08-19 (×14): qty 2

## 2022-08-19 MED ORDER — ALTEPLASE 2 MG IJ SOLR: 2.0000 mg | Freq: Once | INTRAMUSCULAR | Status: DC | PRN

## 2022-08-19 MED ORDER — LIDOCAINE-PRILOCAINE 2.5-2.5 % EX CREA: 1.0000 | TOPICAL_CREAM | CUTANEOUS | Status: DC | PRN

## 2022-08-19 MED ORDER — PENTAFLUOROPROP-TETRAFLUOROETH EX AERO: 1.0000 | INHALATION_SPRAY | CUTANEOUS | Status: DC | PRN

## 2022-08-19 MED ORDER — LIDOCAINE HCL (PF) 1 % IJ SOLN: 5.0000 mL | INTRAMUSCULAR | Status: DC | PRN

## 2022-08-19 NOTE — Plan of Care (Signed)
  Problem: RH Car Transfers Goal: LTG Patient will perform car transfers with assist (PT) Description: LTG: Patient will perform car transfers with assistance (PT). Outcome: Not Applicable Flowsheets (Taken 08/19/2022 1446) LTG: Pt will perform car transfers with assist:: (D/C) -- Note: D/C   Problem: RH Pre-functional/Other (Specify) Goal: RH LTG PT (Specify) 1 Description:  RH LTG PT (Specify) 1 Outcome: Not Applicable Flowsheets (Taken 08/19/2022 1446) LTG: Other PT (Specify) 1: (D/C - pt to receive power WC and ramp) -- Note: D/C - pt to receive power WC and ramp    Problem: RH Balance Goal: LTG Patient will maintain dynamic sitting balance (PT) Description: LTG:  Patient will maintain dynamic sitting balance with assistance during mobility activities (PT) Flowsheets (Taken 08/19/2022 1446) LTG: Pt will maintain dynamic sitting balance during mobility activities with:: (downgraded due to poor trunk control) Minimal Assistance - Patient > 75% Note: downgraded due to poor trunk control    Problem: RH Bed Mobility Goal: LTG Patient will perform bed mobility with assist (PT) Description: LTG: Patient will perform bed mobility with assistance, with/without cues (PT). Flowsheets (Taken 08/19/2022 1446) LTG: Pt will perform bed mobility with assistance level of: (downgraded due to poor trunk control and generalized weakness/deconditioning) Minimal Assistance - Patient > 75% Note: downgraded due to poor trunk control and generalized weakness/deconditioning    Problem: RH Bed to Chair Transfers Goal: LTG Patient will perform bed/chair transfers w/assist (PT) Description: LTG: Patient will perform bed to chair transfers with assistance (PT). Flowsheets (Taken 08/19/2022 1446) LTG: Pt will perform Bed to Chair Transfers with assistance level: (downgraded due to poor trunk control and generalized weakness/deconditioning) Maximal Assistance - Patient 25 - 49% Note: downgraded due to poor trunk  control and generalized weakness/deconditioning

## 2022-08-19 NOTE — Plan of Care (Signed)
  Problem: RH Toilet Transfers Goal: LTG Patient will perform toilet transfers w/assist (OT) Description: LTG: Patient will perform toilet transfers with assist, with/without cues using equipment (OT) Outcome: Not Applicable Flowsheets (Taken 08/19/2022 1555) LTG: Pt will perform toilet transfers with assistance level of: (DC goal 2/2 OOB toileting not safe or recommended at DC 2/2 slower progress than anticipated & due to sitting balance, endurance and strength deficits) --   Problem: RH Pre-functional/Other (Specify) Goal: RH LTG OT (Specify) 1 Description: RH LTG OT (Specify) 1 Outcome: Not Applicable Flowsheets (Taken 08/19/2022 1555) LTG: Other OT (Specify) 1: (DC goal 2/2 OOB toileting not safe or recommended at DC 2/2 slower progress than anticipated & due to sitting balance, endurance and strength deficits) --   Problem: RH Bathing Goal: LTG Patient will bathe all body parts with assist levels (OT) Description: LTG: Patient will bathe all body parts with assist levels (OT) Flowsheets (Taken 08/19/2022 1555) LTG: Pt will perform bathing with assistance level/cueing: (downgraded due to slower progress than anticipated & due to sitting balance, endurance and strength deficits) Moderate Assistance - Patient 50 - 74%   Problem: RH Dressing Goal: LTG Patient will perform lower body dressing w/assist (OT) Description: LTG: Patient will perform lower body dressing with assist, with/without cues in positioning using equipment (OT) Flowsheets (Taken 08/19/2022 1555) LTG: Pt will perform lower body dressing with assistance level of: (downgraded due to slower progress than anticipated & due to sitting balance, endurance and strength deficits) Moderate Assistance - Patient 50 - 74%   Problem: RH Toileting Goal: LTG Patient will perform toileting task (3/3 steps) with assistance level (OT) Description: LTG: Patient will perform toileting task (3/3 steps) with assistance level (OT)  Flowsheets (Taken  08/19/2022 1555) LTG: Pt will perform toileting task (3/3 steps) with assistance level: (downgraded due to slower progress than anticipated & due to sitting balance, endurance and strength deficits) Moderate Assistance - Patient 50 - 74%

## 2022-08-19 NOTE — Progress Notes (Signed)
Physical Therapy Session Note  Patient Details  Name: Edward [name] MRN: [id number] Date of Birth: August 23, 1970  Today's Date: 08/19/2022 PT Individual Time: 1000-1030 PT Individual Time Calculation (min): 30 min  Today's Date: 08/19/2022 PT Co-Treatment Time: 1000-1045 PT Co-Treatment Time Calculation (min): 45 min  Short Term Goals: Week 1:  PT Short Term Goal 1 (Week 1): pt will perform bed mobility with mod A of 1 PT Short Term Goal 2 (Week 1): pt will transfer bed<>chair with LRAD and min A of 1 PT Short Term Goal 3 (Week 1): pt will particpate in WC propulsion up to 97f  Skilled Therapeutic Interventions/Progress Updates:   Pt seen for co-treatment with OT. Received pt sitting in power WC and did not state pain level but c/o pain with movement to LLE. Session with emphasis on discharge planning, transfers, [facility] education, and decreasing caregiver burden. Emphasized need to get family present for additional training session prior to D/C. Discussed how pt currently requires more assist than he did prior to admission, as pt reports his sons could confidently transfer him prior. Pt stated that both his sons are able to assist daily up until 11am but after than will (hopefully) have an aide and his sister will be present intermittently (but unable to physically assist). Therefore, introduced HRel[employer] Energyand educated pt on safety benefits to decrease caregiver burden. Donned sling in sitting with +2 assist, however did not transfer as sling began to slide forward and time restrictions limited further problem solving. Discussed recommendation for hospital bed as pt current bed has no bedrails, is in poor condition, and doesn't have as many features as hospital bed. However, through extensive discussion, pt demonstrated poor awareness of overall current medical status and level of function and will require continued education. Concluded session with pt sitting in power WC, needs within reach, and  seatbelt and seatbelt alarm on.   Therapy Documentation Precautions:  Precautions Precautions: Fall Precaution Comments: previous R AKA, multiple digit amputations Restrictions Weight Bearing Restrictions: Yes RUE Weight Bearing: Weight bearing as tolerated RLE Weight Bearing:  (Bilateral AKA) LLE Weight Bearing: Non weight bearing  Therapy/Group: Co-Treatment AAlfonse AlpersPT, DPT  08/19/2022, 6:52 AM

## 2022-08-19 NOTE — Progress Notes (Signed)
PROGRESS NOTE   Subjective/Complaints: No new complaints this morning Discussed that Cbgs are stable +cough and wheezing- appreciate nephro trying to remove more volume. ROS:   Pt denies SOB, abd pain, CP,  (denied Nausea, but vomited this AM)(+)  N/V/C/D, and vision changes, +cough   Objective:   No results found. Recent Labs    08/19/22 0655  WBC 10.8*  HGB 10.7*  HCT 34.3*  PLT 333    Recent Labs    08/19/22 0655  NA 136  K 4.0  CL 93*  CO2 21*  GLUCOSE 163*  BUN 55*  CREATININE 6.74*  CALCIUM 8.3*     Intake/Output Summary (Last 24 hours) at 08/19/2022 1227 Last data filed at 08/19/2022 0913 Gross per 24 hour  Intake 270 ml  Output --  Net 270 ml        Physical Exam: Vital Signs Blood pressure 96/70, pulse 76, temperature 97.6 F (36.4 C), resp. rate 16, height 3' 11.24" (1.2 m), weight 74.5 kg, SpO2 98 %.    General: awake, alert, appropriate, laying in bed; has 2 Emesis bag sin bed, but denies Nausea; NAD HENT: conjugate gaze; oropharynx moist- says eyelid swelling is "normal for him" when looked at phone to see image CV: regular rate; no JVD Pulmonary: CTA B/L; no W/R/R- good air movement, +cough GI: soft, NT, ND, (+)BS Psychiatric: appropriate- a little flat, wandering slightly, but got back on topic easily Neurological: alert- STM a little decreased/noted Skin: wound vac removed, amputated fingers- Healed R AKA and wrapped with ACE wrap L AKA- which is new Fistula L upper arm Neuro: Alert and oriented to person and place.  Knows December but does not know year or day.  Follows commands.  Cranial nerves II through XII intact. Sensation to light touch intact in all 4 extremities Strength 5 out of 5 bilateral shoulder abduction, elbow flexion and extension Able to lift bilateral lower extremities Musculoskeletal: Patient with multiple finger amputations.  He has amputations of right second,  third and fifth digits at MCP and fourth digit PIP.  He has amputations of the left second and fourth digits.  Left third and fifth digits with flexion contractures.  Right AKA is well-healed.  Left AKA with wound VAC dressing in place draining serosanguineous fluid.  Appropriately tender  No joint swelling noted   Right IJ TDC Right upper extremity IV   Assessment/Plan: 1. Functional deficits which require 3+ hours per day of interdisciplinary therapy in a comprehensive inpatient rehab setting. Physiatrist is providing close team supervision and 24 hour management of active medical problems listed below. Physiatrist and rehab team continue to assess barriers to discharge/monitor patient progress toward functional and medical goals  Care Tool:  Bathing    Body parts bathed by patient: Right arm, Left arm, Chest, Abdomen   Body parts bathed by helper: Front perineal area, Buttocks Body parts n/a: Left lower leg, Right lower leg   Bathing assist Assist Level: Moderate Assistance - Patient 50 - 74%     Upper Body Dressing/Undressing Upper body dressing   What is the patient wearing?: Pull over shirt    Upper body assist Assist Level: Minimal Assistance -  Patient > 75%    Lower Body Dressing/Undressing Lower body dressing      What is the patient wearing?: Pants, Incontinence brief     Lower body assist Assist for lower body dressing: Total Assistance - Patient < 25%     Toileting Toileting Toileting Activity did not occur (Clothing management and hygiene only): N/A (no void or bm)  Toileting assist Assist for toileting: Total Assistance - Patient < 25%     Transfers Chair/bed transfer  Transfers assist  Chair/bed transfer activity did not occur: Safety/medical concerns  Chair/bed transfer assist level: 2 Helpers     Locomotion Ambulation   Ambulation assist   Ambulation activity did not occur: Safety/medical concerns (bilateral AKA, pain)          Walk  10 feet activity   Assist  Walk 10 feet activity did not occur: Safety/medical concerns (bilateral AKA, pain)        Walk 50 feet activity   Assist Walk 50 feet with 2 turns activity did not occur: Safety/medical concerns (bilateral AKA, pain)         Walk 150 feet activity   Assist Walk 150 feet activity did not occur: Safety/medical concerns (bilateral AKA, pain)         Walk 10 feet on uneven surface  activity   Assist Walk 10 feet on uneven surfaces activity did not occur: Safety/medical concerns (bilateral AKA, pain)         Wheelchair     Assist Is the patient using a wheelchair?: Yes Type of Wheelchair: Manual Wheelchair activity did not occur: Safety/medical concerns (multiple digit amputations, bilateral AKA)         Wheelchair 50 feet with 2 turns activity    Assist    Wheelchair 50 feet with 2 turns activity did not occur: Safety/medical concerns (multiple digit amputations, bilateral AKA)       Wheelchair 150 feet activity     Assist  Wheelchair 150 feet activity did not occur: Safety/medical concerns (multiple digit amputations, bilateral AKA)       Blood pressure 96/70, pulse 76, temperature 97.6 F (36.4 C), resp. rate 16, height 3' 11.24" (1.2 m), weight 74.5 kg, SpO2 98 %.  Medical Problem List and Plan: 1. Functional deficits secondary to left AKA 08/05/2022 due to gangrenous changes.  Status post wound VAC.  History of right TKA 01/08/2022             -patient may shower             -ELOS/Goals: 12-14 days , PT/OT min assist             Continue  CIR- PT, and OT-  2. Impaired mobility: order power wheelchair -DVT/anticoagulation:  Pharmaceutical: continue Heparin             -antiplatelet therapy: Aspirin 81 mg daily and Plavix 75 mg daily 3. Pain Management: continue Neurontin 100 mg 3 times daily, oxycodone as needed. Add vitamin C  12/31- taking Oxy max 3x/day 5 mg- d/w PA from Renal- they would prefer [place]  reducing, but not sure if possible yet. 4. Anxiety: Xanax d/ced given hypotension 5. Neuropsych/cognition: This patient is capable of making decisions on his own behalf. 6. Residual limb incision: remove wound vac  12/31- shrinker ordered 7. Fluids/Electrolytes/Nutrition: Routine in and outs with follow-up chemistries 8.  End-stage renal disease.  Hemodialysis as directed per Nephrology. TTS schedule 9.  Acute on chronic anemia. Last HGB 9.5.  Follow-up CBC  10.  Diabetes mellitus.  Hemoglobin A1c 6.3.  Tradjenta 5 mg daily/SSI. CBGs well controlled  12/31- CBGs controlled- con't regimen 11.  Chronic orthostasis.  ProAmatine 10 mg 3 times daily.  Monitor with increased mobility. Monitor fluid intake. 12.  Constipation.  Linzess daily and MiraLAX as needed. LBM 12/26  12/30- LBM yesterday   12/31- BM this AM- small 13.  GERD.  Protonix 14.  Chronic combined CHF.  Ejection fraction 45%.  Monitor for any signs of fluid overload. Daily weights  12/30- up 4+ kg in 3 days- will let renal know.   12/31- d/w renal- will do HD with albumin Tuesday- got HD today due to conflicts in dialysis.  15. Hyperkalemia. Resolved. continue HD per nephrology. 16. Acute transaminitis and cholelithiasis. monitor LFTs.  12/30- resolved 17. Hypotension: d/c xanax, advised to only give additional midodrine if symptomatic.  18. Pruritus: hydrocortisone ordered 19. Loose stool: d/c nepro 20. Cough: CXR ordered and shows some mild congestion/CHF- since on HD, will ask renal to help with this. Plan for increased ultrafiltration goal next HD 21. L AKA cellulitis: Ancef started.       LOS: 6 days A FACE TO FACE EVALUATION WAS PERFORMED  Edward [name] 08/19/2022, 12:27 PM

## 2022-08-19 NOTE — Consult Note (Signed)
Palliative Care Consult Note                                  Date: 08/19/2022   Patient Name: Edward [name]  DOB: 18-Dec-1970  MRN: [id number]  Age / Sex: 52 y.o., male  PCP: [name], MD Referring Physician: Izora Ribas, MD  Reason for Consultation: Establishing goals of care  HPI/Patient Profile: 52 y.o. male  with past medical history of ESRD on HD, PVD status post multiple amputations including right AKA, insulin-dependent diabetes, chronic combined CHF, chronic orthostatic hypotension, and recurrent hyperkalemia.  He presented to [facility] ED on 08/05/2022 with hyperkalemia and bradycardia.  He underwent emergent dialysis.  He was also found to have worsening left leg gangrene.  Patient underwent left AKA on 08/07/2022.  He was admitted to acute inpatient rehab on 08/13/2022.   Palliative medicine was consulted for goals of care   Subjective:   I have reviewed medical records including progress notes, labs and imaging, and met with patient at bedside to discuss diagnosis, prognosis, and goals of care.  Edward [name] is known to PMT from his recent acute hospitalization. I re-introduced Palliative Medicine as specialized medical care for people living with serious illness. It focuses on providing relief from the symptoms and stress of a serious illness.   A brief life review was discussed. He lives with is 2 sons. He does not have any concerns about their ability to help care for him when he returns home from discharge.   We discussed Edward [name]' multiple medical problems including ESRD, peripheral vascular disease, and diabetes.  Provided education on the natural trajectory of chronic illness, emphasizing that it is non-curable and progressive. Discussed that chronic illness can result in decreased functional status over time, as patients often do not return to previous baseline after a major illness or hospitalization.   Created space and  opportunity for patient  to express thoughts regarding current medical situation. Values and goals of care were attempted to be elicited.  Edward [name] tells me that his goals are to "get healed" (from his amputation) and to continue working with PT to improve functional status.  He understands this will take time.  He also verbalizes understanding that he will likely not return to his previous functional status.  He plans to take things "1 day at a time" and states he is "not ready to give up".  A discussion was had today regarding advanced directives. Concepts specific to code status, artifical feeding and hydration, continued IV antibiotics and rehospitalization was had.  The MOST form was introduced and discussed.  Edward [name] is not able to make any decisions about these concepts at this time, but states he will consider.  Edward [name] does share that his wife died in 2006-10-26 (at age 48) from pneumonia. She was on a ventilator, and he had to make the difficult decision to remove this support.   Discussed the importance of continued conversation with patient and his medical providers regarding overall plan of care and treatment options. Offered ongoing palliative support.    Review of Systems  Musculoskeletal:        LLE pain    Objective:   Primary Diagnoses: Present on Admission: **None**   Physical Exam Vitals reviewed.  Constitutional:      General: He is not in acute distress.    Comments: Chronically ill-appearing  Pulmonary:     Effort: Pulmonary effort is  normal.  Musculoskeletal:     Right Lower Extremity: Right leg is amputated above knee.     Left Lower Extremity: Left leg is amputated above knee.  Neurological:     Mental Status: He is alert and oriented to person, place, and time.  Psychiatric:        Behavior: Behavior normal.     Vital Signs:  BP (!) 95/35 (BP Location: Right Arm)   Pulse 81   Temp 97.9 F (36.6 C) (Oral)   Resp 18   Ht 3' 11.24" (1.2 m) Comment: Bilateral  AKA  Wt 74.5 kg   SpO2 97%   BMI 51.74 kg/m   Palliative Assessment/Data: PPS 40-50%     Assessment & Plan:   SUMMARY OF RECOMMENDATIONS   Full code  Continue full scope interventions Goal of care is to return home after rehab PMT will continue to follow  Primary Decision Maker: PATIENT  Symptom Management:  Oxycodone 5 mg every 8 hour as needed for pain  Prognosis:  Unable to determine  Discharge Planning:  Home    Thank you for allowing [place] to participate in the care of Edward [name]  MDM - High   Signed by: Elie Confer, NP Palliative Medicine Team  Team Phone # [phone]  For individual providers, please see AMION

## 2022-08-19 NOTE — Progress Notes (Signed)
Physical Therapy Session Note  Patient Details  Name: Edward [name] MRN: [id number] Date of Birth: 05-Jun-1971  Today's Date: 08/19/2022 PT Individual Time: 1300-1416 PT Individual Time Calculation (min): 76 min   Short Term Goals: Week 1:  PT Short Term Goal 1 (Week 1): pt will perform bed mobility with mod A of 1 PT Short Term Goal 2 (Week 1): pt will transfer bed<>chair with LRAD and min A of 1 PT Short Term Goal 3 (Week 1): pt will particpate in WC propulsion up to 24f  Skilled Therapeutic Interventions/Progress Updates:   Received pt sitting in power WC finishing lunch. Pt agreeable to PT treatment and reported minimal pain in L residual limb during session. Session with emphasis on power WC mobility, functional mobility/transfers, and toileting. In previous OT/PT session, noted pt to be slipping through [place] and to avoid the same thing happening [facility] with nursing, focused on alternate method of transferring with nursing. Pt performed power WC mobility ~2071fwith supervision, including 2 turns - however pt requires assist to safely back WC next to bed for transfers. Pt able to successfully turn power chair on/off but required cues for selecting appropriate speed. Returned to room and retrieved maxislide. Flattened power WC and pt rolled L/R with +2 assist to position maxislide. Performed lateral transfer power WC<>bed via maxislide with +2 assist, then required +2 assist to scoot to [facility] reported urge to have BM and rolled L/R with min A using bedrails and required +2 assist to remove pants/soiled brief and to place bedpan. Noted pt with loose dark stool - RN arrived to administer medication and hook pt up to IV antibiotics. Pt required increased time to have BM and was dependent for peri-care in sidelying. Donned clean brief with max A (pt able to roll L/R with min A) and positioned new chuck pad. Concluded session with pt semi-reclined in bed, needs within reach,  and bed alarm on. Safety plan updated and updated RN on pt's current transfer status.   Therapy Documentation Precautions:  Precautions Precautions: Fall Precaution Comments: previous R AKA, multiple digit amputations Restrictions Weight Bearing Restrictions: Yes RUE Weight Bearing: Weight bearing as tolerated RLE Weight Bearing:  (Bilateral AKA) LLE Weight Bearing: Non weight bearing  Therapy/Group: Individual Therapy AnAlfonse AlpersT, DPT  08/19/2022, 6:53 AM

## 2022-08-19 NOTE — Progress Notes (Signed)
[facility] Progress Note   Subjective:    Seen and examined at bedside. C/o cough. Denies SOB, CP, and N/V. Remains on RA. Expresses concern that not enough fluid was removed during last HD. Noted HD with net UF 1L 12/31. Limited UF d/t hypotension. Now on Midodrine. Next HD 08/20/22.  Objective Vitals:   08/18/22 1417 08/18/22 1934 08/19/22 0456 08/19/22 0503  BP: 1'01/70 95/67 96/70 '$   Pulse: 88 88 76   Resp: '16 16 16   '$ Temp: 98.1 F (36.7 C) 97.6 F (36.4 C) 97.6 F (36.4 C)   TempSrc: Oral Oral    SpO2: 100% 98%    Weight:    74.5 kg  Height:       Physical Exam General: Awake and Alert male in NAD Heart: RRR, no murmurs, rubs or gallops Lungs: CTA bilaterally without wheezing, rhonchi or rales Abdomen: Soft, non-distended, +BS Extremities: B/l AKA, no edema noted.  Dialysis Access: [facility] accessed.    Filed Weights   08/17/22 0523 08/18/22 0847 08/19/22 0503  Weight: 78 kg 76.2 kg 74.5 kg    Intake/Output Summary (Last 24 hours) at 08/19/2022 1009 Last data filed at 08/19/2022 0913 Gross per 24 hour  Intake 270 ml  Output --  Net 270 ml    Additional Objective Labs: Basic Metabolic Panel: Recent Labs  Lab 08/13/22 0919 08/19/22 0655  NA 138 136  K 4.5 4.0  CL 96* 93*  CO2 20* 21*  GLUCOSE 145* 163*  BUN 78* 55*  CREATININE 7.96* 6.74*  CALCIUM 8.7* 8.3*  PHOS 5.7* 7.1*   Liver Function Tests: Recent Labs  Lab 08/13/22 0919 08/19/22 0655  ALBUMIN 2.5* 2.5*   No results for input(s): "LIPASE", "AMYLASE" in the last 168 hours. CBC: Recent Labs  Lab 08/13/22 0919 08/19/22 0655  WBC 13.2* 10.8*  NEUTROABS  --  8.8*  HGB 9.5* 10.7*  HCT 31.2* 34.3*  MCV 96.0 92.7  PLT 217 333   Blood Culture    Component Value Date/Time   SDES BLOOD RIGHT ARM 08/05/2022 2105   SDES BLOOD RIGHT ARM 08/05/2022 2105   SPECREQUEST  08/05/2022 2105    BOTTLES DRAWN AEROBIC AND ANAEROBIC Blood Culture adequate volume   SPECREQUEST  08/05/2022 2105     BOTTLES DRAWN AEROBIC AND ANAEROBIC Blood Culture adequate volume   CULT  08/05/2022 2105    NO GROWTH 5 DAYS Performed at [facility] [address] [place], [place] [address]    CULT  08/05/2022 2105    NO GROWTH 5 DAYS Performed at [facility] [address] [place], [place] [address]    REPTSTATUS 08/10/2022 FINAL 08/05/2022 2105   REPTSTATUS 08/10/2022 FINAL 08/05/2022 2105    Cardiac Enzymes: No results for input(s): "CKTOTAL", "CKMB", "CKMBINDEX", "TROPONINI" in the last 168 hours. CBG: Recent Labs  Lab 08/18/22 0943 08/18/22 1135 08/18/22 1647 08/18/22 2040 08/19/22 0538  GLUCAP 137* 126* 145* 126* 156*   Iron Studies: No results for input(s): "IRON", "TIBC", "TRANSFERRIN", "FERRITIN" in the last 72 hours. Lab Results  Component Value Date   INR 1.5 (H) 05/10/2022   INR 1.2 09/28/2020   INR 1.3 (H) 09/27/2020   Studies/Results: No results found.  Medications:   ceFAZolin (ANCEF) IV 1 g (08/18/22 1439)    vitamin C  1,000 mg Oral Daily   aspirin EC  81 mg Oral Daily   B-complex with vitamin C  1 tablet Oral Daily   Chlorhexidine Gluconate Cloth  6 each Topical Daily   clopidogrel  75 mg Oral Daily   darbepoetin (ARANESP) injection - DIALYSIS  100 mcg Subcutaneous Q Sat-1800   gabapentin  100 mg Oral TID   heparin  5,000 Units Subcutaneous Q12H   hydrocortisone cream   Topical BID   insulin aspart  0-9 Units Subcutaneous TID WC   linaclotide  72 mcg Oral q AM   linagliptin  5 mg Oral Daily   midodrine  10 mg Oral TID WC   nutrition supplement (JUVEN)  1 packet Oral BID BM   pantoprazole  40 mg Oral Daily   sodium bicarbonate  650 mg Oral BID   sucroferric oxyhydroxide  1,000 mg Oral BID WC   zinc sulfate  220 mg Oral Daily    [facility]  4.5h  425/ 700   89kg  2/2 bath  TDC  Hep 8000+ 2044md - mircera 1519m q2, last 12/16, due 12/30 - venofer '50mg'$  IV weekly - hectorol 1038mIV TIW - korsuva for itching  -  velphoro 3 tabs with meals- last phos 12.5  Assessment/Plan: L foot wound - S/P L AKA per Dr. DudSharol Given/20. Now in CIR for rehab. ESRD -  TTS HD. OP Heparin dose too high for current wt. Lower dose on discharge.  Hypotension/volume: BP remains soft, has hx of hypotension at OP Clinic as well. Pain medication frequency decreased 08/14/2022 and again 12/29. Started midodrine here at 10 mg tid. C/o cough, will try to increase UF goal next HD as tolerated. Will need lower EDW on discharge post AKA. No Antihypertensive meds. Next HD 08/20/22.  Anemia: ESA given 08/18/2022, Hgb  10.07.1etabolic bone disease: Calcium elevated, holding hectorol for now.  PO4 trending up, binders recently resumed, will continue to titrate up if needed  Nutrition:  Renal diet. Very low albumin. Add protein supps.  Abnormal LFT's - RUQ US [place] cholelithiasis and gallbladder wall thickening.  Negative US [place] sign, acute hepatitis panel negative.  Per primary.  Bradycardia: Resolved Hyperkalemia:Resolved.   CouTobie PoetP [facility] 08/19/2022,10:09 AM  LOS: 6 days

## 2022-08-19 NOTE — Progress Notes (Addendum)
Occupational Therapy Session Note  Patient Details  Name: Edward [name] MRN: [id number] Date of Birth: February 21, 1971  Today's Date: 08/19/2022 OT Co-Treatment Time: 1000-1045 OT Co-Treatment Time Calculation (min): 45 min   Short Term Goals: Week 1:  OT Short Term Goal 1 (Week 1): Patient will increased LB dressing at bed level to Max A. OT Short Term Goal 2 (Week 1): Patient will demonstrate improved functional performance during toilet transfers utilizing most appropriate technique with Mod A x1.  Skilled Therapeutic Interventions/Progress Updates:  Skilled OT intervention completed with focus on co-treat with PT to determine safest mobility/transfer and self-care plans for discharge readiness with introduction to hoyer lift and bed level ADLs/DME needs initiated.   Pt received seated in PWC, agreeable to session. Un-rated pain reported in back and buttocks with nurse already aware. Therapist offered repositioning and modifications for pain reduction during session.  See PT Becky Sax) note for education regarding hoyer lift and mobility recommendations with this OT acting as skilled problem solver for determining sling fit/positioning and reducing pain during transfer.  OT discussed with pt bed level toileting recommendations at this time 2/2 +2 assist needed for transfers as well as 2/2 poor sitting balance. Pt with very poor insight about his CLOF, indicating to OT that he can roll himself in bed without bed rail for bed pan/bed level toileting when asked if he would be agreeable to hospital bed for ease of bed level mobility and repositioning. With extensive education and time for pt comprehension, pt then receptive to receiving a hospital bed for rolling, repositioning and independence with each of those for his functional needs.  Pt remained upright in PWC, with PWC belt donned, belt alarm on/activated, and with all needs in reach at end of session.   Therapy  Documentation Precautions:  Precautions Precautions: Fall Precaution Comments: previous R AKA, multiple digit amputations Restrictions Weight Bearing Restrictions: Yes RUE Weight Bearing: Weight bearing as tolerated RLE Weight Bearing:  (Bilateral AKA) LLE Weight Bearing: Non weight bearing    Therapy/Group: Co-Treatment  Prue Lingenfelter E Mikel Pyon, MS, OTR/L  08/19/2022, 12:24 PM

## 2022-08-19 NOTE — Progress Notes (Signed)
Occupational Therapy Session Note  Patient Details  Name: Edward [name] MRN: [id number] Date of Birth: 1971/03/27  Today's Date: 08/19/2022 OT Individual Time: 6063-0160 OT Individual Time Calculation (min): 73 min    Short Term Goals: Week 1:  OT Short Term Goal 1 (Week 1): Patient will increased LB dressing at bed level to Max A. OT Short Term Goal 2 (Week 1): Patient will demonstrate improved functional performance during toilet transfers utilizing most appropriate technique with Mod A x1.  Skilled Therapeutic Interventions/Progress Updates:  Skilled OT intervention completed with focus on toileting needs, ADL retraining and functional transfers. Pt received upright in bed eating breakfast, agreeable to session. Un-rated pain reported in buttocks, nurse made aware however pt declining pain meds at this time with willingness to try other non-pharm methods for pain intervention. Therapist offered rest breaks, repositioning and pressure relief for pain reduction throughout.  Pt denied toileting needs however with suspected incontinence 2/2 smell. Pt agreeable for therapist to ensure cleanliness to maintain skin integrity. Able to roll > R with heavy min A with pt using bed rail, with noted small incontinent BM. Total A peri-care and threading of new brief with 2nd person utilized for rolling > L and maintaining L side lying with heavy min A for brief fastening. Donned pants in similar manner with total A.  Mod A needed for sitting upright with heavy use of bed rail, but once up was able to maintain sitting balance unsupported with supervision while using R bed rail. Therapist washed back and applied anti-itch lotion per request, then pt able to wash rest of UB with set up A. Max A needed for donning shirt in unsupported/supported sitting.  Pt was able to twist bottom/rotate core in prep for posterior transfer into PWC with use of palms on bed and overall heavy mod A with therapist utilizing chuck  pad. With Max A +2 (with rehab tech) pt was able to posteriorly transfer into PWC with +2 total A needed via chuck pad for boosting over gap between bed and PWC seat. Tilt feature of PWC utilized for scooting hips back with max A with 2nd person utilized for pulling w/c cushion forward as it had slid too far back during transfer.   Upright in [place], therapist applied leg rest feature on chair for support of bilateral residual limb with several blankets utilized for support per request vs pillow. Discussed how having his leg rests out would minimize his ability to pull his chair underneath sink for example.  Pt able to complete oral care with min A with built up handle on toothbrush, and assist for drinking water for swishing and for applying tooth paste and holding spit cup. Did have one spill when pt attempted to hold spit cup by himself. Discussed using standard cup for ease of holding that he could try on next trial and he requires multiple rounds of brushing teeth 2/2 residual food remaining in mouth.  Pt remained upright in [place] with chair slightly tilted for pressure relief/safety, with PWC belt donned, belt alarm on/activated, and with all needs in reach at end of session.   Therapy Documentation Precautions:  Precautions Precautions: Fall Precaution Comments: previous R AKA, multiple digit amputations Restrictions Weight Bearing Restrictions: Yes RUE Weight Bearing: Weight bearing as tolerated RLE Weight Bearing:  (Bilateral AKA) LLE Weight Bearing: Non weight bearing    Therapy/Group: Individual Therapy  Blase Mess, MS, OTR/L  08/19/2022, 12:18 PM

## 2022-08-20 DIAGNOSIS — Z89611 Acquired absence of right leg above knee: Secondary | ICD-10-CM | POA: Diagnosis not present

## 2022-08-20 DIAGNOSIS — Z89612 Acquired absence of left leg above knee: Secondary | ICD-10-CM | POA: Diagnosis not present

## 2022-08-20 LAB — GLUCOSE, CAPILLARY
Glucose-Capillary: 186 mg/dL — ABNORMAL HIGH (ref 70–99)
Glucose-Capillary: 217 mg/dL — ABNORMAL HIGH (ref 70–99)
Glucose-Capillary: 138 mg/dL — ABNORMAL HIGH (ref 70–99)

## 2022-08-20 MED ORDER — OXYCODONE HCL 5 MG PO TABS
5.0000 mg | ORAL_TABLET | Freq: Two times a day (BID) | ORAL | Status: DC | PRN
  Administered 2022-08-20 – 2022-08-22 (×4): 5 mg via ORAL
  Filled 2022-08-20 (×4): qty 1

## 2022-08-20 NOTE — Progress Notes (Signed)
Edward [name] Progress Note   Subjective:    No new issues-  due for HD later today   Objective Vitals:   08/19/22 1316 08/19/22 2013 08/20/22 0342 08/20/22 0500  BP: (!) 95/35 (!) 81/59 90/61   Pulse: 81 90 84   Resp: '18 18 17   '$ Temp: 97.9 F (36.6 C) 98.4 F (36.9 C) (!) 97.5 F (36.4 C)   TempSrc: Oral Oral Oral   SpO2: 97% 100% 96%   Weight:    74.4 kg  Height:       Physical Exam General: Awake and Alert male in NAD Heart: RRR, no murmurs, rubs or gallops Lungs: CTA bilaterally without wheezing, rhonchi or rales Abdomen: Soft, non-distended, +BS Extremities: B/l AKA, no edema noted.  Dialysis Access: [facility]    Filed Weights   08/18/22 0847 08/19/22 0503 08/20/22 0500  Weight: 76.2 kg 74.5 kg 74.4 kg    Intake/Output Summary (Last 24 hours) at 08/20/2022 1133 Last data filed at 08/19/2022 1315 Gross per 24 hour  Intake 240 ml  Output --  Net 240 ml    Additional Objective Labs: Basic Metabolic Panel: Recent Labs  Lab 08/19/22 0655  NA 136  K 4.0  CL 93*  CO2 21*  GLUCOSE 163*  BUN 55*  CREATININE 6.74*  CALCIUM 8.3*  PHOS 7.1*   Liver Function Tests: Recent Labs  Lab 08/19/22 0655  ALBUMIN 2.5*   No results for input(s): "LIPASE", "AMYLASE" in the last 168 hours. CBC: Recent Labs  Lab 08/19/22 0655  WBC 10.8*  NEUTROABS 8.8*  HGB 10.7*  HCT 34.3*  MCV 92.7  PLT 333   Blood Culture    Component Value Date/Time   SDES BLOOD RIGHT ARM 08/05/2022 2105   SDES BLOOD RIGHT ARM 08/05/2022 2105   SPECREQUEST  08/05/2022 2105    BOTTLES DRAWN AEROBIC AND ANAEROBIC Blood Culture adequate volume   SPECREQUEST  08/05/2022 2105    BOTTLES DRAWN AEROBIC AND ANAEROBIC Blood Culture adequate volume   CULT  08/05/2022 2105    NO GROWTH 5 DAYS Performed at [facility], [place] [address] [place], [place] [address]    CULT  08/05/2022 2105    NO GROWTH 5 DAYS Performed at [facility] [address], [place], [place]  [address]    REPTSTATUS 08/10/2022 FINAL 08/05/2022 2105   REPTSTATUS 08/10/2022 FINAL 08/05/2022 2105    Cardiac Enzymes: No results for input(s): "CKTOTAL", "CKMB", "CKMBINDEX", "TROPONINI" in the last 168 hours. CBG: Recent Labs  Lab 08/19/22 0538 08/19/22 1121 08/19/22 1645 08/19/22 2118 08/20/22 0508  GLUCAP 156* 157* 188* 219* 186*   Iron Studies: No results for input(s): "IRON", "TIBC", "TRANSFERRIN", "FERRITIN" in the last 72 hours. Lab Results  Component Value Date   INR 1.5 (H) 05/10/2022   INR 1.2 09/28/2020   INR 1.3 (H) 09/27/2020   Studies/Results: No results found.  Medications:   ceFAZolin (ANCEF) IV 1 g (08/19/22 1401)    vitamin C  1,000 mg Oral Daily   aspirin EC  81 mg Oral Daily   B-complex with vitamin C  1 tablet Oral Daily   Chlorhexidine Gluconate Cloth  6 each Topical Daily   clopidogrel  75 mg Oral Daily   darbepoetin (ARANESP) injection - DIALYSIS  100 mcg Subcutaneous Q Sat-1800   gabapentin  100 mg Oral TID   heparin  5,000 Units Subcutaneous Q12H   hydrocortisone cream   Topical BID   insulin aspart  0-9 Units Subcutaneous TID WC   linaclotide  72 mcg Oral q AM   linagliptin  5 mg Oral Daily   midodrine  10 mg Oral TID WC   nutrition supplement (JUVEN)  1 packet Oral BID BM   pantoprazole  40 mg Oral Daily   sodium bicarbonate  650 mg Oral BID   sucroferric oxyhydroxide  1,000 mg Oral TID WC   zinc sulfate  220 mg Oral Daily    [facility] Orders[facility]  4.5h  425/ 700   89kg  2/2 bath  TDC  Hep 8000+ 2041md - mircera 1531m q2, last 12/16, due 12/30 - venofer '50mg'$  IV weekly - hectorol 1054mIV TIW - korsuva for itching  - velphoro 3 tabs with meals- last phos 12.5  Assessment/Plan: L foot wound - S/P L AKA per Dr. DudSharol Given/20. Now in CIR for rehab. ESRD -  TTS HD- due later today  Hypotension/volume: BP remains soft, has hx of hypotension at OP [facility] well. Pain medication frequency decreased 08/14/2022 and again 12/29.  Started midodrine here at 10 mg tid. C/o cough, will try to increase UF goal next HD as tolerated. Will need lower EDW on discharge post AKA. No Antihypertensive meds. Next HD 08/20/22.  Anemia: ESA given 08/18/2022, Hgb  10.35.3etabolic bone disease: Calcium elevated, holding hectorol for now.  PO4 trending up, binders recently resumed, will continue to titrate up if needed  Nutrition:  Renal diet. Very low albumin. Add protein supps.  Abnormal LFT's - RUQ US [place] cholelithiasis and gallbladder wall thickening.  Negative US [place] sign, acute hepatitis panel negative.  Per primary.  Bradycardia: Resolved Hyperkalemia:Resolved.    Edward [name] 08/20/2022,11:33 AM  LOS: 7 days

## 2022-08-20 NOTE — Progress Notes (Signed)
Patient ID: Edward [name], male   DOB: 1971/05/29, 52 y.o.   MRN: [id number]  Family education tomorrow 8-10

## 2022-08-20 NOTE — Progress Notes (Signed)
Physical Therapy Session Note  Patient Details  Name: Edward [name] MRN: [id number] Date of Birth: October 16, 1970  Today's Date: 08/20/2022 PT Individual Time: [phone] and [id number] PT Individual Time Calculation (min): 73 min and 69 min  Short Term Goals: Week 1:  PT Short Term Goal 1 (Week 1): pt will perform bed mobility with mod A of 1 PT Short Term Goal 2 (Week 1): pt will transfer bed<>chair with LRAD and min A of 1 PT Short Term Goal 3 (Week 1): pt will particpate in WC propulsion up to 60f  Skilled Therapeutic Interventions/Progress Updates:   Treatment Session 1 Received pt semi-reclined in bed eating breakfast. Pt agreeable to PT treatment and reported pain 5/10 in L residual limb - RN notified and present to administer medications. Session with emphasis on dressing, functional mobility/transfers, and generalized strengthening and endurance. While eating breakfast discussed having pt's sons come in tomorrow for additional family education prior to D/C.   Pt reported residual limb dressing had not been checked. Unwrapped dressing and noted clear/bloody drainage with brownish tint - RN aware. Rewrapped L residual limb with abdominal pad, gauze, and ace wrap with total A. Checked to ensure brief was clean via rolling to R with min A using bedrails. Pt found to be clean and donned scrub pants wit max A and rolled L/R with min A and use of bedrails and required +2 assist to pull pants over hips.   Pt transferred sem-reclined<>long sitting in bed with mod A +2 with cues for hand placement on bedrails to pull into long sitting. Pt able to maintain static sitting balance with supervision with BUE support on bedrails. Doffed dirty shirt and donned clean pull over shirt with max A overall. Pt then turned around to prepare for PA transfer using bedrails and mod A +2 to turn chuck pad. Worked on backwards reciprocal scooting to EOB with mod A +2 then performed PA transfer into power WC with +2 assist  using chuck pads with assist to place pt's hands on WC armrests to assist with boosting. Pt then performed the following exercises sitting in power WC with emphasis on UE strength: -modified crunches with power WC reclined 2x10 -bicep curls with yellow TB 2x10 -trunk rotations x10 bilaterally - pt with multiple LOB due to poor core strength -shoulder abduction/ER with yellow TB x15 Concluded session with pt sitting in power WC, needs within reach, and seatbelt on.   Treatment Session 2 Received pt sitting in power WC. Brandon from [employer] [facility] Pt's 2 sons will be primary caregivers and were assisting pt with transfers at baseline. Plan to get ramp built to address 1 STE and pt plans on using power WC for mobility in home, then [facility] so that chair can be transported in son's vehicles. Discussed features that new Group 3 power chair will have including retractable ball joystick, flip back arm rests, power tilt, seat elevator, laterals to prevent excessive hip abduction, contour back support for trunk control, seatbelt, and cushion with foam in the front and Roho in the back to maintain skin integrity. Scheduled for loaner chair to be delivered to pt's house on Friday afternoon. Reclined power chair into supine and rolled L/R with min/mod A and required +2 assist to place maxislide. Then performed lateral transfer power WC<>bed with +2 assist and [facility]. Nephology MD present briefly during session to discuss plan for dialysis. Pt then  performed SLR x10 bilaterally and hip abduction with yellow TB 2x10 with emphasis on LE strength. Pt's son arrived and briefly discussed CLOF, ability to care for pt at home, transfers, power WC and need for ramp, and confirmed family education for tomorrow from 8-10am. Pt fatigued at end of session. Concluded session with pt sitting upright in bed, needs within reach, and  bed alarm on.   Therapy Documentation Precautions:  Precautions Precautions: Fall Precaution Comments: previous R AKA, multiple digit amputations Restrictions Weight Bearing Restrictions: Yes RUE Weight Bearing: Weight bearing as tolerated RLE Weight Bearing:  (Bilateral AKA) LLE Weight Bearing: Non weight bearing  Therapy/Group: Individual Therapy Alfonse Alpers PT, DPT  08/20/2022, 6:51 AM

## 2022-08-20 NOTE — Progress Notes (Signed)
Occupational Therapy Session Note  Patient Details  Name: Edward [name] MRN: [id number] Date of Birth: 10/15/1970  Today's Date: 08/20/2022 OT Individual Time: 8341-9622 OT Individual Time Calculation (min): 58 min    Short Term Goals: Week 1:  OT Short Term Goal 1 (Week 1): Patient will increased LB dressing at bed level to Max A. OT Short Term Goal 2 (Week 1): Patient will demonstrate improved functional performance during toilet transfers utilizing most appropriate technique with Mod A x1.  Skilled Therapeutic Interventions/Progress Updates:  Skilled OT intervention completed with focus on oral care, [place] joystick control for repositioning and navigation. Pt received seated in PWC, agreeable to session. Unrated pain reported in L residual limb, pre-medicated. Therapist offered rest breaks and repositioning throughout for pain reduction.  L residual limb was noted to be off w/c cushion underneath arm rest, with pt unaware. Therapist placed folded up towel between L arm rest and L residual limb for proper positioning of limb in confinement of w/c width and for safety.   Pt was able to navigate himself in [place] to sink with supervision/cues for joystick features. Education provided on modified technique of placing toothbrush in hole of sink for independence with donning toothpaste to brush. Able to do all with supervision/cues. Required several wash/rinse cycles 2/2 heavy residual food in between teeth and poor hygiene.  Extensive time spent educating pt and demonstrating to him the features of the joystick including all the tilt/raise/lower features, the different display screens (mobility vs repositioning vs battery). Initially pt required max to total verbal cues with mod physical assist to manage joystick however with repeated demo and backwards chaining, pt was then able to demo tilting himself back with only supervision, as well as lift/lower chair and switch between positioning mobility  screen. However will need continued practice as pt has minimal carryover between sessions and may benefit from simple picture/guide for memory.  Pt remained seated in [place] with chair slightly tilted, with belt donned, alarm on/activated, and with all needs in reach at end of session.   Therapy Documentation Precautions:  Precautions Precautions: Fall Precaution Comments: previous R AKA, multiple digit amputations Restrictions Weight Bearing Restrictions: Yes RUE Weight Bearing: Weight bearing as tolerated RLE Weight Bearing:  (Bilateral AKA) LLE Weight Bearing: Non weight bearing    Therapy/Group: Individual Therapy  Blase Mess, MS, OTR/L  08/20/2022, 12:17 PM

## 2022-08-20 NOTE — Progress Notes (Signed)
PROGRESS NOTE   Subjective/Complaints: He asks what time HD is, discussed we can check with nursing and should be after therapies, discussed plan to try to take more volume off.   ROS:   Pt denies SOB, abd pain, CP,  (denied Nausea, but vomited this AM)(+)  N/V/C/D, and vision changes, +cough   Objective:   No results found. Recent Labs    08/19/22 0655  WBC 10.8*  HGB 10.7*  HCT 34.3*  PLT 333    Recent Labs    08/19/22 0655  NA 136  K 4.0  CL 93*  CO2 21*  GLUCOSE 163*  BUN 55*  CREATININE 6.74*  CALCIUM 8.3*     Intake/Output Summary (Last 24 hours) at 08/20/2022 1141 Last data filed at 08/19/2022 1315 Gross per 24 hour  Intake 240 ml  Output --  Net 240 ml        Physical Exam: Vital Signs Blood pressure 90/61, pulse 84, temperature (!) 97.5 F (36.4 C), temperature source Oral, resp. rate 17, height 3' 11.24" (1.2 m), weight 74.4 kg, SpO2 96 %. General: awake, alert, appropriate, laying in bed; has 2 Emesis bag sin bed, but denies Nausea; NAD HENT: conjugate gaze; oropharynx moist- says eyelid swelling is "normal for him" when looked at phone to see image CV: regular rate; no JVD Pulmonary: CTA B/L; no W/R/R- good air movement, +cough GI: soft, NT, ND, (+)BS Psychiatric: appropriate- a little flat, wandering slightly, but got back on topic easily Neurological: alert- STM a little decreased/noted Skin: wound vac removed, amputated fingers- Healed R AKA and wrapped with ACE wrap L AKA- which is new Fistula L upper arm Neuro: Alert and oriented to person and place.  Knows December but does not know year or day.  Follows commands.  Cranial nerves II through XII intact. Sensation to light touch intact in all 4 extremities Strength 5 out of 5 bilateral shoulder abduction, elbow flexion and extension Able to lift bilateral lower extremities Musculoskeletal: Patient with multiple finger amputations.  He  has amputations of right second, third and fifth digits at MCP and fourth digit PIP.  He has amputations of the left second and fourth digits.  Left third and fifth digits with flexion contractures.  Right AKA is well-healed.  Left AKA with wound VAC dressing in place draining serosanguineous fluid.  Appropriately tender  No joint swelling noted. Purulent drainage decreased   Right IJ TDC Right upper extremity IV   Assessment/Plan: 1. Functional deficits which require 3+ hours per day of interdisciplinary therapy in a comprehensive inpatient rehab setting. Physiatrist is providing close team supervision and 24 hour management of active medical problems listed below. Physiatrist and rehab team continue to assess barriers to discharge/monitor patient progress toward functional and medical goals  Care Tool:  Bathing    Body parts bathed by patient: Right arm, Left arm, Chest, Abdomen   Body parts bathed by helper: Front perineal area, Buttocks Body parts n/a: Left lower leg, Right lower leg   Bathing assist Assist Level: Moderate Assistance - Patient 50 - 74%     Upper Body Dressing/Undressing Upper body dressing   What is the patient wearing?: Pull  over shirt    Upper body assist Assist Level: Minimal Assistance - Patient > 75%    Lower Body Dressing/Undressing Lower body dressing      What is the patient wearing?: Pants, Incontinence brief     Lower body assist Assist for lower body dressing: Total Assistance - Patient < 25%     Toileting Toileting Toileting Activity did not occur (Clothing management and hygiene only): N/A (no void or bm)  Toileting assist Assist for toileting: Total Assistance - Patient < 25%     Transfers Chair/bed transfer  Transfers assist  Chair/bed transfer activity did not occur: Safety/medical concerns  Chair/bed transfer assist level: 2 Helpers     Locomotion Ambulation   Ambulation assist   Ambulation activity did not occur:  Safety/medical concerns (bilateral AKA, pain)          Walk 10 feet activity   Assist  Walk 10 feet activity did not occur: Safety/medical concerns (bilateral AKA, pain)        Walk 50 feet activity   Assist Walk 50 feet with 2 turns activity did not occur: Safety/medical concerns (bilateral AKA, pain)         Walk 150 feet activity   Assist Walk 150 feet activity did not occur: Safety/medical concerns (bilateral AKA, pain)         Walk 10 feet on uneven surface  activity   Assist Walk 10 feet on uneven surfaces activity did not occur: Safety/medical concerns (bilateral AKA, pain)         Wheelchair     Assist Is the patient using a wheelchair?: Yes Type of Wheelchair: Manual Wheelchair activity did not occur: Safety/medical concerns (multiple digit amputations, bilateral AKA)         Wheelchair 50 feet with 2 turns activity    Assist    Wheelchair 50 feet with 2 turns activity did not occur: Safety/medical concerns (multiple digit amputations, bilateral AKA)       Wheelchair 150 feet activity     Assist  Wheelchair 150 feet activity did not occur: Safety/medical concerns (multiple digit amputations, bilateral AKA)       Blood pressure 90/61, pulse 84, temperature (!) 97.5 F (36.4 C), temperature source Oral, resp. rate 17, height 3' 11.24" (1.2 m), weight 74.4 kg, SpO2 96 %.  Medical Problem List and Plan: 1. Functional deficits secondary to left AKA 08/05/2022 due to gangrenous changes.  Status post wound VAC.  History of right TKA 01/08/2022             -patient may shower             -ELOS/Goals: 12-14 days , PT/OT min assist            Continue CIR- PT, and OT-  2. Impaired mobility: order power wheelchair, will need to teach family members safe transfers -DVT/anticoagulation:  Pharmaceutical: continue Heparin             -antiplatelet therapy: Aspirin 81 mg daily and Plavix 75 mg daily 3. Pain Management: continue  Neurontin 100 mg 3 times daily, decrease oxycodone to BID as needed. Add vitamin C 4. Anxiety: Xanax d/ced given hypotension 5. Neuropsych/cognition: This patient is capable of making decisions on his own behalf. 6. Residual limb incision: remove wound vac  12/31- shrinker ordered 7. Fluids/Electrolytes/Nutrition: Routine in and outs with follow-up chemistries 8.  End-stage renal disease.  Hemodialysis as directed per Nephrology. TTS schedule 9.  Acute on chronic anemia. Last HGB 9.5.  Follow-up CBC 10.  Diabetes mellitus.  Hemoglobin A1c 6.3.  Tradjenta 5 mg daily/SSI. CBGs well controlled  12/31- CBGs controlled- con't regimen 11.  Chronic orthostasis.  ProAmatine 10 mg 3 times daily.  Monitor with increased mobility. Monitor fluid intake. 12.  Constipation.  Linzess daily and MiraLAX as needed. LBM 12/26  12/30- LBM yesterday   12/31- BM this AM- small 13.  GERD.  Protonix 14.  Chronic combined CHF.  Ejection fraction 45%.  Monitor for any signs of fluid overload. Daily weights  d/w renal- will do HD with albumin Tuesday, discussed plan to take greater volume off to prevent fluid overload.  15. Hyperkalemia. Resolved. continue HD per nephrology. 16. Acute transaminitis and cholelithiasis. monitor LFTs.  12/30- resolved 17. Hypotension: d/c xanax, advised to only give additional midodrine if symptomatic.  18. Pruritus: hydrocortisone ordered 19. Loose stool: d/c nepro 20. Cough: CXR ordered and shows some mild congestion/CHF- since on HD, will ask renal to help with this. Plan for increased ultrafiltration goal next HD 21. L AKA cellulitis: Ancef started.       LOS: 7 days A FACE TO FACE EVALUATION WAS PERFORMED  Clide Deutscher Mikeisha Lemonds 08/20/2022, 11:41 AM

## 2022-08-20 NOTE — Progress Notes (Signed)
Received patient in bed to unit.  Alert and oriented.  Informed consent signed and in chart.   Treatment initiated: 1338 Treatment completed: 1725  Patient tolerated well.  Transported back to the room  Alert, without acute distress.  Hand-off given to patient's nurse.   Access used: catheter Access issues: none  Total UF removed: 2 L Medication(s) given: none Post HD VS: 99/63 P 63 R 12  Post HD weight: 75.8kg   [facility]

## 2022-08-20 NOTE — Progress Notes (Signed)
Patient ID: Edward [name], male   DOB: 1970/12/18, 52 y.o.   MRN: [id number]  HB ordered through [employer]

## 2022-08-20 NOTE — Discharge Summary (Signed)
Physician Discharge Summary  Patient ID: Edward [name] MRN: [id number] DOB/AGE: 09-01-1970 52 y.o.  Admit date: 08/13/2022 Discharge date: 08/23/2022  Discharge Diagnoses:  Principal Problem:   S/P bilateral above knee amputation ([place]) DVT prophylaxis Pain management End-stage renal disease Acute on chronic anemia Diabetes mellitus Chronic orthostasis Chronic combined CHF GERD Constipation Hyperlipidemia History of gout  Discharged Condition: Stable  Significant Diagnostic Studies: DG Chest 2 View  Result Date: 08/22/2022 CLINICAL DATA:  Cough, CHF, renal dysfunction EXAM: CHEST - 2 VIEW COMPARISON:  Previous studies including the examination of 08/16/2022 FINDINGS: Transverse diameter of heart is increased. Central pulmonary vessels are slightly less prominent. There are no signs of alveolar pulmonary edema or new focal infiltrates. There is no pleural effusion or pneumothorax. Tip of dialysis catheter is seen in the region of right atrium. IMPRESSION: Cardiomegaly. There is decrease in pulmonary vascular congestion. There are no signs of pulmonary edema or new focal infiltrates. Electronically Signed   By: Elmer Picker M.D.   On: 08/22/2022 10:09   DG Chest 2 View  Result Date: 08/16/2022 CLINICAL DATA:  Cough EXAM: CHEST - 2 VIEW COMPARISON:  Previous studies including the examination of 08/05/2022 FINDINGS: Transverse diameter of heart is increased. Possible coronary artery calcifications are seen. Central pulmonary vessels are prominent. There are no signs of alveolar pulmonary edema or new focal infiltrates. There is no pleural effusion or pneumothorax. Tip of dialysis catheter is seen at the junction of superior vena cava and right atrium. IMPRESSION: Cardiomegaly. Central pulmonary vessels are prominent suggesting mild CHF. There are no signs of alveolar pulmonary edema or focal pulmonary consolidation. Electronically Signed   By: Elmer Picker M.D.   On: 08/16/2022  19:19   US Abdomen Limited RUQ (LIVER/GB)  Result Date: 08/06/2022 CLINICAL DATA:  Elevated LFT EXAM: ULTRASOUND ABDOMEN LIMITED RIGHT UPPER QUADRANT COMPARISON:  None Available. FINDINGS: Gallbladder: Small stones. Negative sonographic Murphy. Slight increased wall thickness at 4.7 mm. Common bile duct: Diameter: 6.6 mm Liver: Slightly echogenic liver parenchyma. No focal hepatic abnormality. Portal vein is patent on color Doppler imaging with normal direction of blood flow towards the liver. Other: None. IMPRESSION: 1. Cholelithiasis with gallbladder wall thickening but negative sonographic Murphy. Upper normal common duct diameter. 2. Possible slightly echogenic liver parenchyma suggesting hepatic steatosis. Electronically Signed   By: Donavan Foil M.D.   On: 08/06/2022 22:41   DG Chest Portable 1 View  Result Date: 08/05/2022 CLINICAL DATA:  Weakness.  End-stage renal disease. EXAM: PORTABLE CHEST 1 VIEW COMPARISON:  05/10/2022 FINDINGS: The cardio pericardial silhouette is enlarged. Right IJ central line tip overlies the right atrium. The lungs are clear without focal pneumonia, edema, pneumothorax or pleural effusion. The visualized bony structures of the thorax are unremarkable. Telemetry leads overlie the chest. IMPRESSION: Enlargement of the cardiopericardial silhouette without acute cardiopulmonary findings. Electronically Signed   By: Misty Stanley M.D.   On: 08/05/2022 13:02    Labs:  Basic Metabolic Panel: Recent Labs  Lab 08/19/22 0655 08/22/22 0544  NA 136 135  K 4.0 3.8  CL 93* 92*  CO2 21* 22  GLUCOSE 163* 141*  BUN 55* 55*  CREATININE 6.74* 7.08*  CALCIUM 8.3* 8.5*  PHOS 7.1* 4.2    CBC: Recent Labs  Lab 08/19/22 0655 08/22/22 0544  WBC 10.8* 9.4  NEUTROABS 8.8*  --   HGB 10.7* 10.9*  HCT 34.3* 34.6*  MCV 92.7 92.0  PLT 333 299    CBG: Recent Labs  Lab 08/21/22  1646 08/21/22 2133 08/22/22 0535 08/22/22 1158 08/22/22 2120  GLUCAP 191* 86 141* 156*  99   Family history.  Mother with pancreatic cancer.  Denies any colon cancer esophageal cancer or rectal cancer  Brief HPI:   Edward [name] is a 52 y.o. right-handed male with history of right below-knee amputation 06/19/2018 as well as left transmetatarsal amputation 05/10/2016 receiving inpatient rehab services 06/22/2018 - 07/01/2018 and has since undergone revision of right BKA to right AKA 01/08/2022 as well as multiple finger amputations, quit smoking 2 years ago, chronic combined CHF with ejection fraction of 45%, chronic orthostatic hypotension, end-stage renal disease with hemodialysis, peripheral vascular disease maintained on aspirin and Plavix.  Per chart review lives with 2 sons.  Has a prosthesis for right leg but has not started using it yet.  Presented 08/05/2022 with gangrenous changes of the left lower extremity, bradycardia and hypertension.  He was treated for hyperkalemia with potassium of 7.  He failed conservative measures underwent elective left AKA 08/07/2022 per Dr. Sharol Given.  Wound VAC applied.  Hemodialysis ongoing as per renal services.  Acute on chronic anemia 9.5.  He was cleared to begin subcutaneous heparin for DVT prophylaxis.  Therapy evaluations completed due to patient's decreased functional mobility was admitted for a comprehensive rehab program.   Hospital Course: Edward [name] was admitted to rehab 08/13/2022 for inpatient therapies to consist of PT, ST and OT at least three hours five days a week. Past admission physiatrist, therapy team and rehab RN have worked together to provide customized collaborative inpatient rehab.  Pertaining to patient left AKA 08/05/2022 due to gangrenous changes status post wound VAC.  Patient would follow-up with Dr. Sharol Given.  Patient had been placed on intravenous Ancef for wound coverage.  Patient with history of right BKA in the past has yet to use prosthesis.  Subcutaneous heparin for DVT prophylaxis.  With history of peripheral vascular  disease he remained on aspirin and Plavix.  Pain managed use of Neurontin scheduled as well as oxycodone as needed.  Acute on chronic anemia latest hemoglobin 9.5 monitoring for any bleeding episodes.  Hemodialysis ongoing as per renal services.  Hemoglobin A1c 6.3 with Tradjenta as advised and diabetic teaching completed and latest blood sugars 86 191 107.  He currently remained off insulin therapy will continue check blood sugars at home resume Humalog as needed.  Chronic orthostasis ProAmatine 3 times daily.  Chronic combined CHF exhibiting no signs of fluid overload.  Palliative care was consulted to establish goals of care.   Blood pressures were monitored on TID basis and soft and monitored  Diabetes has been monitored with ac/hs CBG checks and SSI was use prn for tighter BS control.    Rehab course: During patient's stay in rehab weekly team conferences were held to monitor patient's progress, set goals and discuss barriers to discharge. At admission, patient required minimal assist sit to supine moderate assist supine to sit  Physical exam.  Blood pressure 94/72 pulse 93 temperature 98 respirations 18 oxygen saturations 96% room air Constitutional.  No acute distress HEENT Head.  Normocephalic and atraumatic Eyes.  Pupils round and reactive to light no discharge without nystagmus Neck.  Supple nontender no JVD without thyromegaly Cardiac regular rate and rhythm without any extra sounds or murmur heard Abdomen.  Soft nontender positive bowel sounds without rebound Respiratory effort normal no respiratory distress without wheeze Extremities.  No clubbing cyanosis or edema Neurologic.  Alert oriented to person and place but needed  some cues for month. Strength 5 out of 5 bilateral shoulder abduction elbow flexion and extension Able to lift bilateral lower extremities Musculoskeletal.  Patient multiple finger amputations.  He has amputations of right second third and fifth digits at MCP  and fourth digit PIP.  He has amputations of the left second and fourth digits.  Left third and fifth digits with flexion contracture.  Right AKA well-healed.  Left AKA with wound VAC and dressing in place.  H  Media Information   Document Information  Photos  12/31 l aka  08/18/2022 11:23  Attached To:  Hospital Encounter on 08/13/22  Source Information  Lovorn, Megan, MD  [facility]  e/She  has had improvement in activity tolerance, balance, postural control as well as ability to compensate for deficits. He/She has had improvement in functional use RUE/LUE  and RLE/LLE as well as improvement in awareness.  Sessions with emphasis on power wheelchair mobility functional mobility transfers and ongoing family teaching.  Supervision wheelchair mobility.  Patient able to successfully turn power chair on and off but required cues for selecting appropriate speed.  Perform lateral transfer power wheelchair to bed via Maxi slide with plus to assist the required plus to assist to scoot to head of bed.  Donned clean briefs with max assist and position new chuck pad.  OT discussed with patient and family bed level toileting recommendations plus to assist needed for transfers as well as 2/2 poor sitting balance.  With extensive education and time for family teaching was completed and plan discharge to home       Disposition: Discharge to home    Diet: Renal diet  Special Instructions: No driving smoking or alcohol  Continue hemodialysis as directed  Continue ABD pad Curlex with Ace wrap to amputation site change as needed  Medications at discharge 1.  Tylenol as needed 2.  Vitamin C 1000 mg p.o. daily 3.  Aspirin 81 mg p.o. daily 4.  B complex with vitamin C 1 tablet daily 5.  Plavix 75 mg p.o. daily 6.  Neurontin 100 mg p.o. 3 times daily 7.  Linzess 72 mcg p.o. daily 8.  Tradjenta 5 mg p.o. daily 9.  ProAmatine 10 mg p.o. 3 times daily 10.  Oxycodone 5 mg every 8 hours as  needed pain 11.  Protonix 40 mg p.o. daily 12.  Sodium bicarbonate 650 mg p.o. twice daily 13.Velphoro 1000 mg p.o. 3 times daily with meals 14.  Zinc sulfate 220 mg p.o. daily 16.  Zyloprim 100 mg daily as needed 17.  Xanax 0.25 mg daily 18.  Lipitor 80 mg p.o. daily 19.  Zetia 10 mg p.o. daily  30-35 minutes were spent completing discharge summary and discharge planning  Discharge Instructions     Ambulatory referral to Physical Medicine Rehab   Complete by: As directed    Moderate complexity follow-up 1 to 2 weeks bilateral AKA        Follow-up Information     Raulkar, Clide Deutscher, MD Follow up.   Specialty: Physical Medicine and Rehabilitation Why: Office to call for appointment Contact information: [address] [place] [address] [phone]         [name], MD Follow up.   Specialty: Orthopedic Surgery Why: Call for appointment Contact information: [place] [address] [phone]         Donato Heinz, MD Follow up.   Specialty: Nephrology Why: Call for appointment Contact information:  [address] [place] [address] [phone]                 Signed: Lavon Paganini Annmargaret Decaprio 08/23/2022, 5:02 AM

## 2022-08-21 ENCOUNTER — Telehealth: Payer: Self-pay

## 2022-08-21 DIAGNOSIS — F418 Other specified anxiety disorders: Secondary | ICD-10-CM

## 2022-08-21 LAB — GLUCOSE, CAPILLARY
Glucose-Capillary: 202 mg/dL — ABNORMAL HIGH (ref 70–99)
Glucose-Capillary: 107 mg/dL — ABNORMAL HIGH (ref 70–99)
Glucose-Capillary: 191 mg/dL — ABNORMAL HIGH (ref 70–99)
Glucose-Capillary: 86 mg/dL (ref 70–99)

## 2022-08-21 MED ORDER — CHLORHEXIDINE GLUCONATE CLOTH 2 % EX PADS
6.0000 | MEDICATED_PAD | Freq: Every day | CUTANEOUS | Status: DC
  Administered 2022-08-22 – 2022-08-23 (×2): 6 via TOPICAL

## 2022-08-21 MED ORDER — DOXERCALCIFEROL 4 MCG/2ML IV SOLN
2.0000 ug | INTRAVENOUS | Status: DC
  Administered 2022-08-22: 2 ug via INTRAVENOUS
  Filled 2022-08-21 (×2): qty 2

## 2022-08-21 NOTE — Progress Notes (Signed)
Physical Therapy Session Note  Patient Details  Name: Edward [name] MRN: [id number] Date of Birth: 12-03-1970  Today's Date: 08/21/2022 PT Individual Time: 1304-1400 PT Individual Time Calculation (min): 56 min   Short Term Goals: Week 1:  PT Short Term Goal 1 (Week 1): pt will perform bed mobility with mod A of 1 PT Short Term Goal 2 (Week 1): pt will transfer bed<>chair with LRAD and min A of 1 PT Short Term Goal 3 (Week 1): pt will particpate in WC propulsion up to 68f  Skilled Therapeutic Interventions/Progress Updates:   Pt received supine in bed and agreeable to PT. Supine>sit transfer with mod assist at trunk and cues for use of bed rails as needed.  Rolling. R and L with min assist and heavy use of rails for safety.   Attempted posterior transfer with use of chuck pad and max cues for reciprocal scooting. Unable to perform due ot pain in the LLE and wound dressing falling off. Orthopedic surgery present to assess wound. PT redressed wound.   Scooting in bed with use of BUE and mod assist to improve position in bed. . Supine there SLR 2 x 12 BLE and hip  abduction x 12 BLE PT performed WC adjustment to allow controler to swing away from patient and improve safety and effectiveness of transfers.        Therapy Documentation Precautions:  Precautions Precautions: Fall Precaution Comments: new L AKA, previous R AKA, multiple digit amputations Restrictions Weight Bearing Restrictions: Yes RUE Weight Bearing: Weight bearing as tolerated RLE Weight Bearing:  (Bilateral AKA) LLE Weight Bearing: Non weight bearing  Vital Signs: Therapy Vitals Temp: 97.6 F (36.4 C) Pulse Rate: 85 Resp: 17 BP: 93/67 Patient Position (if appropriate): Lying Oxygen Therapy SpO2: 100 % O2 Device: Room Air Pain: 5/10 LLE at medial distal incision site.   Therapy/Group: Individual Therapy  ALorie Phenix1/10/2022, 5:52 PM

## 2022-08-21 NOTE — Consult Note (Signed)
Neuropsychological Consultation   Patient:   Edward [name]   DOB:   14-Feb-1971  MR Number:  [id number]  Location:  [facility] [id number] [place] [address] Dept: [place]: [phone]           Date of Service:   08/21/2021  Start Time:   2 PM End Time:   3 PM  Provider/Observer:  Ilean Skill, Psy.D.       Clinical Neuropsychologist       Billing Code/Service: [phone]  Reason for Service:    Edward [name] is a 52 year old male referred for neuropsychological consultation due to coping and adjustment issues with return to the comprehensive inpatient rehabilitation services due to recent left AKA.  Patient has a past medical history including right AKA and multiple surgeries on both legs and loss of multiple fingers.  Patient has combined chronic congestive heart failure and peripheral vascular disease.  Patient had MRI back in February 2023 with concerns of a possible white matter lacunar infarct and as expected showed chronic small vessel disease most pronounced in the left corona radiata/external capsule.  Patient most recently presented on 08/05/2022 with gangrenous changes in left lower extremity, bradycardia and hypotension.  Patient treated at after conservative efforts failed underwent elective left AKA on 08/07/2022.  Therapy evaluations were completed and patient was admitted to CIR due to functional decline particularly with mobility and admitted.   Below is the HPI for the current admission for convenience:  Edward [name] 52 year old right-handed male well-known to rehab services right below-knee amputation 06/19/2018 as well as left transmetatarsal amputation 05/10/2016 receiving inpatient rehab services 06/22/2018 - 07/01/2018 and has since undergone revision of right BKA to AKA 01/08/2022, multiple finger amputations,, quit smoking 2 years ago, chronic combined CHF with  ejection fraction 45%, chronic orthostatic hypotension, end-stage renal disease with hemodialysis PVD maintained on aspirin and Plavix. Patient reports that he lives with his 2 adult aged sons.  1 level home with one-step to entry.  He has a prosthesis for right leg but started using it yet.  Presented 08/05/2022 with gangrenous changes of the left lower extremity, bradycardia and hypotension.  He was treated for hyperkalemia with K+ of 7. He failed conservative measures and underwent elective left AKA 08/07/2022 per Dr. Sharol Given.  Wound VAC applied.  Hospital course hemodialysis ongoing as per renal services.  Acute on chronic anemia 9.5.  He was cleared to begin subcutaneous heparin for DVT prophylaxis.  Therapy evaluations completed due to patient decreased functional mobility was admitted for a comprehensive rehab program.   Current Status:  Patient was awake but somewhat drowsy when I entered the room laying in his bed with slight elevation in head.  Patient with bandage wrapping his upper left leg at recent surgical site.  Patient wearing shorts and right AKA clearly evident.  Patient also missing multiple fingers from previous amputations due to peripheral artery disease.  Patient also with significant chronic kidney disease and on dialysis.  Patient reports that he has dealt with significant anxiety and worry about his medical status and has been through a lot of interventions with amputations going back many years now.  Patient has supportive family.  Patient reports that he struggles with intrusive thoughts and anxiety about his medical status and that being in the "hospital setting" triggers a lot of intrusive thoughts and worry.  Behavioral Observation: Edward [name]  presents as a  52 y.o.-year-old Right handed African American Male who appeared his stated age. his dress was Appropriate and he was Well Groomed and his manners were Appropriate to the situation.  his participation was indicative of  Appropriate and Drowsy behaviors.  There were physical disabilities noted.  he displayed an appropriate level of cooperation and motivation.    Interactions:    Active Appropriate and Drowsy  Attention:   abnormal and attention span appeared shorter than expected for age  Memory:   within normal limits; recent and remote memory intact  Visuo-spatial:  not examined  Speech (Volume):  normal  Speech:   normal; slurred  Thought Process:  Coherent and Relevant  Though Content:  WNL; not suicidal and not homicidal  Orientation:   person, place, time/date, and situation  Judgment:   Fair  Planning:   Fair  Affect:    Anxious  Mood:    Anxious  Insight:   Fair  Intelligence:   normal  Substance Use:  There is a documented history of tobacco abuse confirmed by the patient.  Patient quit smoking a couple of years ago  Medical History:   Past Medical History:  Diagnosis Date   Allergy    Anemia    getting Venofer with dialysis   Anxiety    self reported, sometimes-situational anxiety   Arthritis    bilateral hands/LEFT hip   CHF (congestive heart failure) (HCC)    Chronic kidney disease    t th s dialysis- [place] ---ESRD   Coronary artery disease    Depression    self reported, sometimes-situational depression   Diabetes mellitus    type II- on meds   Dyspnea    sometimes- fluid   GERD (gastroesophageal reflux disease)    on meds   History of blood transfusion    Hypertension    no longer on medication since starting on Hemodialysis   Myocardial infarction ([place]) 2019   Peripheral vascular disease ([place])    Sleep apnea    does not use CPAP         Patient Active Problem List   Diagnosis Date Noted   S/P bilateral above knee amputation ([place]) 08/13/2022   Atherosclerosis of native arteries of extremities with gangrene, left leg ([place]) 08/06/2022   Diabetic foot ulcer ([place]) 08/05/2022   Pectoralis muscle strain 05/10/2022   Cervical lesion 05/10/2022   Hand  abscess 19/41/7408   Acute metabolic encephalopathy    PAD (peripheral artery disease) ([place]) 01/07/2022   Osteomyelitis of finger of left hand ([place]) 10/04/2021   Junctional rhythm 10/04/2021   Acute cholecystitis 09/27/2020   Elevated LFTs 09/27/2020   TIA (transient ischemic attack) 09/27/2020   Nausea and vomiting    ESRD on hemodialysis ([place]) 06/22/2020   S/P coronary artery stent placement 06/22/2020   Encounter for removal of sutures 03/07/2020   Fluid overload, unspecified 12/29/2019   Hypertensive chronic kidney disease with stage 1 through stage 4 chronic kidney disease, or unspecified chronic kidney disease 11/30/2019   Pruritus, unspecified 11/30/2019   Secondary hyperparathyroidism of renal origin ([place]) 11/30/2019   Unspecified atrial fibrillation ([place]) 11/30/2019   Pain, unspecified 11/30/2019   Other specified arthritis, unspecified site 11/30/2019   Iron deficiency anemia, unspecified 11/30/2019   End stage renal disease ([place]) 11/30/2019   Encounter for screening for respiratory tuberculosis 11/30/2019   Diarrhea, unspecified 11/30/2019   Coagulation defect, unspecified ([place]) 11/30/2019   Atherosclerotic heart disease of native coronary artery without angina pectoris 11/30/2019  Acute renal failure (ARF) ([place]) 11/22/2019   Transaminitis 12/22/2018   Acute exacerbation of CHF (congestive heart failure) ([place]) 12/21/2018   Gait abnormality 09/11/2018   CAD S/P percutaneous coronary angioplasty 08/06/2018   Cardiomyopathy ([place]) 08/06/2018   PVD (peripheral vascular disease) ([place]) 08/06/2018   Shortness of breath    Chronic combined systolic and diastolic CHF (congestive heart failure) ([place])    History of NSTEMI 07/28/2018   Respiratory failure with hypoxia ([place]) 07/28/2018   Acute heart failure ([place]) 07/28/2018   Hypertensive emergency 07/28/2018   S/P BKA (below knee amputation) unilateral, right ([place]) 07/15/2018   Hypoglycemia    Anxiety about health    Labile  blood glucose    Essential hypertension    History of transmetatarsal amputation of left foot ([place])    Hypoalbuminemia due to protein-calorie malnutrition ([place])    Poorly controlled type 2 diabetes mellitus with peripheral neuropathy ([place])    Acute blood loss anemia    Anemia of chronic disease    Leukocytosis    Tobacco abuse    Hyperkalemia 06/20/2018   Diabetic polyneuropathy associated with type 2 diabetes mellitus (HCC)    Severe protein-calorie malnutrition ([place])    AKI (acute kidney injury) ([place]) 06/17/2018   Normocytic anemia 06/17/2018   Viral pharyngitis 01/14/2017   Cigarette smoker 01/14/2017   Avascular necrosis of hip, left ([place]) 10/11/2016   Status post left hip replacement 10/11/2016   S/P transmetatarsal amputation of foot, left ([place]) 07/25/2016   Balance problem 07/25/2016   Fournier's gangrene 08/16/2011   Acute respiratory failure ([place]) 08/16/2011   Septic shock(785.52) 08/16/2011   Diabetes mellitus with complication, with long-term current use of insulin ([place]) 08/16/2011         Psychiatric History:  Patient with past history of anxiety particularly situational anxiety related to his health status and with recent events these types of worry/intrusive thoughts have been exacerbated.  Family Med/Psych History:  Family History  Problem Relation Age of Onset   Diabetes Other    Pancreatic cancer Mother 51   Colon cancer Neg Hx    Colon polyps Neg Hx    Esophageal cancer Neg Hx    Rectal cancer Neg Hx    Stomach cancer Neg Hx     Impression/DX:  Edward [name] is a 52 year old male referred for neuropsychological consultation due to coping and adjustment issues with return to the comprehensive inpatient rehabilitation services due to recent left AKA.  Patient has a past medical history including right AKA and multiple surgeries on both legs and loss of multiple fingers.  Patient has combined chronic congestive heart failure and peripheral vascular disease.   Patient had MRI back in February 2023 with concerns of a possible white matter lacunar infarct and as expected showed chronic small vessel disease most pronounced in the left corona radiata/external capsule.  Patient most recently presented on 08/05/2022 with gangrenous changes in left lower extremity, bradycardia and hypotension.  Patient treated at after conservative efforts failed underwent elective left AKA on 08/07/2022.  Therapy evaluations were completed and patient was admitted to CIR due to functional decline particularly with mobility and admitted.  Patient was awake but somewhat drowsy when I entered the room laying in his bed with slight elevation in head.  Patient with bandage wrapping his upper left leg at recent surgical site.  Patient wearing shorts and right AKA clearly evident.  Patient also missing multiple fingers from previous amputations due to peripheral artery disease.  Patient also with  significant chronic kidney disease and on dialysis.  Patient reports that he has dealt with significant anxiety and worry about his medical status and has been through a lot of interventions with amputations going back many years now.  Patient has supportive family.  Patient reports that he struggles with intrusive thoughts and anxiety about his medical status and that being in the "hospital setting" triggers a lot of intrusive thoughts and worry.  Disposition/Plan:  Today we worked on coping and adjustment issues particular around issues of anxiety and intrusive thoughts and addressed concerns around his current medical status and what to expect going forward.  Diagnosis:    Anxiety and depression about health/medical status   S/P bilateral above knee amputation [facility]) - Plan: Ambulatory referral to Physical Medicine Rehab         Electronically Signed   _______________________ Ilean Skill, Psy.D. Clinical Neuropsychologist

## 2022-08-21 NOTE — Progress Notes (Signed)
Orthopedic Tech Progress Note Patient Details:  Edward [name] 1971-04-07 [id number]  Called in order to [facility] for BLE AKA SHRINKERS   Patient ID: Edward [name], male   DOB: [birth year], 52 y.o.   MRN: [id number]  [name] 08/21/2022, 1:42 PM

## 2022-08-21 NOTE — Progress Notes (Signed)
Physical Therapy Discharge Summary  Patient Details  Name: Edward [name] MRN: [id number] Date of Birth: 1971-08-11  Date of Discharge from PT service:August 22, 2021  Patient has met 3 of 5 long term goals due to improved activity tolerance, improved balance, improved postural control, increased strength, ability to compensate for deficits, improved awareness, and improved coordination. Patient to discharge at a wheelchair level Total Assist+2.  Patient's care partner is independent to provide the necessary physical assistance at discharge. Pt's sons attended family education training on 1/4 and practiced with bed mobility and AP/PA transfers to/from power WC. Recommended having pt's son's return for further family education however due to amount of physical assist pt currently requires, however unable to coordinate with work schedule. Primary PT/OT recommended extending D/C date to allow for further practice with hands on training to ensure safe mobility at home, however pt refused and insisting on discharging on 1/4.   Reasons goals not met: Pt did not meet bed mobility goal of min A as pt currently requires min A to roll, mod A to transition semi-reclined<>long sitting, +2 assist to transition supine<>sitting EOB, and min A to transition sit<>supine due to decreased core strength, pain, and generalized weakness. Pt also did not meet transfer goal of max A as pt currently requires +2 assist for bed<>chair transfers using chuck pads.   Recommendation:  Patient will benefit from ongoing skilled PT services in home health setting to continue to advance safe functional mobility, address ongoing impairments in transfers, generalized strengthening and endurance, amputee education, and to minimize fall risk.  Equipment: Hospital bed, power [facility]  Reasons for discharge: lack of progress toward goals, treatment goals met, and discharge from hospital  Patient/family agrees with progress made and goals  achieved: Yes  PT Discharge Precautions/Restrictions Precautions Precautions: Fall Precaution Comments: new L AKA, previous R AKA, multiple digit amputations Restrictions Weight Bearing Restrictions: Yes LLE Weight Bearing: Non weight bearing Pain Interference Pain Interference Pain Effect on Sleep: 3. Frequently Pain Interference with Therapy Activities: 0. Does not apply - I have not received rehabilitationtherapy in the past 5 days Pain Interference with Day-to-Day Activities: 2. Occasionally Cognition Overall Cognitive Status: Within Functional Limits for tasks assessed Arousal/Alertness: Awake/alert Orientation Level: Oriented X4 Memory: Appears intact Awareness: Appears intact Problem Solving: Impaired Safety/Judgment: Appears intact Sensation Sensation Light Touch: Appears Intact Proprioception: Impaired by gross assessment (BUE) Additional Comments: pt reports intermittent phantom pain Coordination Gross Motor Movements are Fluid and Coordinated: No Fine Motor Movements are Fluid and Coordinated: No Coordination and Movement Description: grossly uncoordinated due to multiple digit amputations, bilateral AKA, pain, and decreased balance strategies Finger Nose Finger Test: difficulty to perform 2/2 multiple digit amputations and coordination deficits Heel Shin Test: unable to perform due to bilateral AKA Motor  Motor Motor: Abnormal postural alignment and control Motor - Skilled Clinical Observations: grossly uncoordinated due to multiple digit amputation, bilateral AKA, pain, and decreased balance strategies  Mobility Bed Mobility Bed Mobility: Rolling Right;Rolling Left;Sit to Supine;Supine to Sit Rolling Right: Minimal Assistance - Patient > 75% Rolling Left: Minimal Assistance - Patient > 75% Supine to Sit: 2 Helpers Sit to Supine: Minimal Assistance - Patient > 75% Transfers Transfers: Development worker, community: 2 Helpers (using  chuck pads) Transfer (Assistive device): None Locomotion  Gait Ambulation: No Gait Gait: No Stairs / Additional Locomotion Stairs: No Architect: Yes Wheelchair Assistance: Chartered loss adjuster: Left upper extremity Wheelchair Parts Management: Needs assistance Distance: 255f  Trunk/Postural Assessment  Cervical Assessment Cervical Assessment: Within Functional Limits Thoracic Assessment Thoracic Assessment: Exceptions to [facility] (rounded shoulders) Lumbar Assessment Lumbar Assessment: Exceptions to [facility] (posterior pelvic tilt) Postural Control Postural Control: Deficits on evaluation Trunk Control: posterior lean with generalized unsteadiness and lateral LOB in both directions Righting Reactions: delayed due to bilateral AKA  Balance Balance Balance Assessed: Yes Static Sitting Balance Static Sitting - Balance Support: Bilateral upper extremity supported (bedrails) Static Sitting - Level of Assistance: 5: Stand by assistance (supervision) Static Sitting - Comment/# of Minutes: in long sitting Dynamic Sitting Balance Dynamic Sitting - Balance Support: No upper extremity supported Dynamic Sitting - Level of Assistance: 4: Min assist Dynamic Sitting Balance - Compensations: in long sitting Sitting balance - Comments: pt relies heavily on external BUE support Extremity Assessment  RLE Assessment RLE Assessment: Not tested General Strength Comments: not formally tested; grossly 3+/5 LLE Assessment LLE Assessment: Not tested General Strength Comments: not formally tested; grossly 3/5   Fabrizzio Marcella M Travus Oren Rocio Roam PT, DPT  08/21/2022, 12:58 PM

## 2022-08-21 NOTE — Progress Notes (Signed)
Patient ID: Edward [name], male   DOB: [birth year], 52 y.o.   MRN: [id number]  Team Conference Report to Patient/Family  Team Conference discussion was reviewed with the patient and caregiver, including goals, any changes in plan of care and target discharge date.  Patient and caregiver express understanding and are in agreement.  The patient has a target discharge date of 08/26/22 (D/C date extended).  Sw met with patient and discussed team conference updates. Patient prefers to d/c on Friday instead of extending to Monday. Sw made attempt to schedule family education with patient son. Fannie Knee is unable due to his work schedule. Fannie Knee will check in with his brother to potentially attend. Sw will continue to follow up.   [name] 08/21/2022, 2:17 PM

## 2022-08-21 NOTE — Progress Notes (Addendum)
PROGRESS NOTE   Subjective/Complaints: Patient is with increased brownish drainage from AKA incision site, vascular contacted Son at bedside requests to speak with nephrology  ROS:   Pt denies SOB, abd pain, CP,  (denied Nausea, but vomited this AM)(+)  N/V/C/D, and vision changes, +cough- improved   Objective:   No results found. Recent Labs    08/19/22 0655  WBC 10.8*  HGB 10.7*  HCT 34.3*  PLT 333    Recent Labs    08/19/22 0655  NA 136  K 4.0  CL 93*  CO2 21*  GLUCOSE 163*  BUN 55*  CREATININE 6.74*  CALCIUM 8.3*     Intake/Output Summary (Last 24 hours) at 08/21/2022 1029 Last data filed at 08/21/2022 0811 Gross per 24 hour  Intake 220 ml  Output 2000 ml  Net -1780 ml        Physical Exam: Vital Signs Blood pressure 90/70, pulse 85, temperature (!) 97.4 F (36.3 C), temperature source Oral, resp. rate 17, height 3' 11.24" (1.2 m), weight 75.8 kg, SpO2 100 %. Gen: no distress, normal appearing HEENT: oral mucosa pink and moist, NCAT Cardio: Reg rate Pulmonary: CTA B/L; no W/R/R- good air movement, +cough GI: soft, NT, ND, (+)BS Psychiatric: appropriate- a little flat, wandering slightly, but got back on topic easily Neurological: alert- STM a little decreased/noted Skin: wound vac removed, amputated fingers- Healed R AKA and wrapped with ACE wrap L AKA- which is new Fistula L upper arm Neuro: Alert and oriented to person and place.  Knows December but does not know year or day.  Follows commands.  Cranial nerves II through XII intact. Sensation to light touch intact in all 4 extremities Strength 5 out of 5 bilateral shoulder abduction, elbow flexion and extension Able to lift bilateral lower extremities Musculoskeletal: Patient with multiple finger amputations.  He has amputations of right second, third and fifth digits at MCP and fourth digit PIP.  He has amputations of the left second and  fourth digits.  Left third and fifth digits with flexion contractures.  Right AKA is well-healed.  Left AKA with wound VAC dressing in place draining serosanguineous fluid.  Appropriately tender  No joint swelling noted. Purulent drainage increased   Right IJ TDC Right upper extremity IV   Assessment/Plan: 1. Functional deficits which require 3+ hours per day of interdisciplinary therapy in a comprehensive inpatient rehab setting. Physiatrist is providing close team supervision and 24 hour management of active medical problems listed below. Physiatrist and rehab team continue to assess barriers to discharge/monitor patient progress toward functional and medical goals  Care Tool:  Bathing    Body parts bathed by patient: Right arm, Left arm, Chest, Abdomen   Body parts bathed by helper: Front perineal area, Buttocks Body parts n/a: Left lower leg, Right lower leg   Bathing assist Assist Level: Moderate Assistance - Patient 50 - 74%     Upper Body Dressing/Undressing Upper body dressing   What is the patient wearing?: Pull over shirt    Upper body assist Assist Level: Minimal Assistance - Patient > 75%    Lower Body Dressing/Undressing Lower body dressing      What  is the patient wearing?: Pants, Incontinence brief     Lower body assist Assist for lower body dressing: Total Assistance - Patient < 25%     Toileting Toileting Toileting Activity did not occur (Clothing management and hygiene only): N/A (no void or bm)  Toileting assist Assist for toileting: Total Assistance - Patient < 25%     Transfers Chair/bed transfer  Transfers assist  Chair/bed transfer activity did not occur: Safety/medical concerns  Chair/bed transfer assist level: 2 Helpers     Locomotion Ambulation   Ambulation assist   Ambulation activity did not occur: Safety/medical concerns (bilateral AKA, pain)          Walk 10 feet activity   Assist  Walk 10 feet activity did not  occur: Safety/medical concerns (bilateral AKA, pain)        Walk 50 feet activity   Assist Walk 50 feet with 2 turns activity did not occur: Safety/medical concerns (bilateral AKA, pain)         Walk 150 feet activity   Assist Walk 150 feet activity did not occur: Safety/medical concerns (bilateral AKA, pain)         Walk 10 feet on uneven surface  activity   Assist Walk 10 feet on uneven surfaces activity did not occur: Safety/medical concerns (bilateral AKA, pain)         Wheelchair     Assist Is the patient using a wheelchair?: Yes Type of Wheelchair: Manual Wheelchair activity did not occur: Safety/medical concerns (multiple digit amputations, bilateral AKA)         Wheelchair 50 feet with 2 turns activity    Assist    Wheelchair 50 feet with 2 turns activity did not occur: Safety/medical concerns (multiple digit amputations, bilateral AKA)       Wheelchair 150 feet activity     Assist  Wheelchair 150 feet activity did not occur: Safety/medical concerns (multiple digit amputations, bilateral AKA)       Blood pressure 90/70, pulse 85, temperature (!) 97.4 F (36.3 C), temperature source Oral, resp. rate 17, height 3' 11.24" (1.2 m), weight 75.8 kg, SpO2 100 %.  Medical Problem List and Plan: 1. Functional deficits secondary to left AKA 08/05/2022 due to gangrenous changes.  Status post wound VAC.  History of right TKA 01/08/2022             -patient may shower             -ELOS/Goals: 12-14 days , PT/OT min assist            Continue CIR- PT, and OT-   May benefit for a couple more days of family ed.  2. Impaired mobility: order power wheelchair, will need to teach family members safe transfers -DVT/anticoagulation:  Pharmaceutical: continue Heparin             -antiplatelet therapy: Aspirin 81 mg daily and Plavix 75 mg daily 3. Pain Management: continue Neurontin 100 mg 3 times daily, decrease oxycodone to BID as needed. Add vitamin  C 4. Anxiety: Xanax d/ced given hypotension, discussed with patient 5. Neuropsych/cognition: This patient is capable of making decisions on his own behalf. 6. Residual limb incision: contacted vascular given increased purulent drainage.  7. Fluids/Electrolytes/Nutrition: Routine in and outs with follow-up chemistries 8.  End-stage renal disease.  Hemodialysis as directed per Nephrology. TTS schedule. Will inform nephrology that son would like to speak with them 56.  Acute on chronic anemia. Last HGB improved to 10.7, monitor outpatient 10.  Diabetes mellitus.  Hemoglobin A1c 6.3.  Tradjenta 5 mg daily/SSI. CBGs well controlled  12/31- CBGs controlled- con't regimen 11.  Chronic orthostasis.  ProAmatine 10 mg 3 times daily.  Monitor with increased mobility. Monitor fluid intake. 12.  Constipation.  Linzess daily and MiraLAX as needed. LBM 12/26  12/30- LBM yesterday   12/31- BM this AM- small 13.  GERD.  Protonix 14.  Chronic combined CHF.  Ejection fraction 45%.  Monitor for any signs of fluid overload. Daily weights  d/w renal- will do HD with albumin Tuesday, discussed plan to take greater volume off to prevent fluid overload.  15. Hyperkalemia. Resolved. continue HD per nephrology. 16. Acute transaminitis and cholelithiasis. monitor LFTs.  12/30- resolved 17. Hypotension: d/c xanax, advised to only give additional midodrine if symptomatic.  18. Pruritus: hydrocortisone ordered 19. Loose stool: d/c nepro, messaged nursing to see if stools are still loose.  20. Cough: CXR ordered and shows some mild congestion/CHF- since on HD, will ask renal to help with this. Plan for increased ultrafiltration goal next HD. Continue SLP. Discussed importance of repositioning before eating.  21. L AKA cellulitis: Ancef started. Notified ortho of continued drainage      LOS: 8 days A FACE TO FACE EVALUATION WAS PERFORMED  Martha Clan P Noel Rodier 08/21/2022, 10:29 AM

## 2022-08-21 NOTE — Progress Notes (Signed)
Speech Language Pathology Daily Session Note  Patient Details  Name: Edward [name] MRN: [id number] Date of Birth: Jan 12, 1971  Today's Date: 08/21/2022 SLP Individual Time: 0715-0755 SLP Individual Time Calculation (min): 40 min  Short Term Goals: Week 1: SLP Short Term Goal 1 (Week 1): STG = LTG d/t ELOS  Skilled Therapeutic Interventions: Skilled treatment session focused on dysphagia goals. Upon arrival, patient was awake in bed with the lights off, a left lateral lean with head resting on the bed rail, and semi-reclined with his breakfast meal in front of him. SLP facilitated session by providing education regarding the importance of proper positioning with PO intake with SLP providing repositioning in bed. Patient consumed his breakfast meal of regular textures with thin liquids with one overt cough X 1 however difficulty to differentiate between baseline cough vs possible overt s/s of aspiration. Patient reports xerostomia with mildly prolonged mastication and mild oral residue that patient cleared with Mod I. Recommend patient continue current diet with SLP providing education to the patient and his sons (arrived halfway through session) regarding swallowing compensatory strategies including small bites/sips, proper postioning, liquid washes prior to after PO intake, and minimizing distractions. SLP also educated on reflux precautions. Patient and sons verbalized understanding. SLP will continue to follow. Continue with current plan of care.      Pain Pain Assessment Pain Scale: 0-10 Pain Score: 7  Pain Type: Surgical pain Pain Orientation: Left Pain Descriptors / Indicators: Aching Pain Frequency: Constant Pain Onset: On-going Patients Stated Pain Goal: 3 Pain Intervention(s): Repositioned  Therapy/Group: Individual Therapy  Raffaela Ladley 08/21/2022, 11:07 AM

## 2022-08-21 NOTE — Progress Notes (Signed)
Patient ID: Edward [name], male   DOB: 1971/07/29, 52 y.o.   MRN: [id number] Patient is status post left above-knee amputation.  Examination the majority of the incision is completely healed there is 1 area of about a centimeter in length with wound breakdown and small amount bloody drainage.  Will have the staples removed plan for healing with secondary intention.  Continue dry dressing change as needed.

## 2022-08-21 NOTE — Progress Notes (Signed)
Occupational Therapy Session Note  Patient Details  Name: Edward [name] MRN: [id number] Date of Birth: [birth year]  Today's Date: 08/21/2022 OT Individual Time: 5638-7564 OT Individual Time Calculation (min): 59 min    Short Term Goals: Week 1:  OT Short Term Goal 1 (Week 1): Patient will increased LB dressing at bed level to Max A. OT Short Term Goal 2 (Week 1): Patient will demonstrate improved functional performance during toilet transfers utilizing most appropriate technique with Mod A x1.  Skilled Therapeutic Interventions/Progress Updates:  Skilled OT intervention completed with focus on family ed with pt and both sons present. Pt received with heavy L lateral lean sitting in bed, agreeable to session. No pain reported during session.  Family education with emphasis on the following: -review of PLOF with son's confirming that pt is close to baseline, that he did bed level or EOB bathing/dressing but could stand and transfer with them assisting with a hop on one leg method -CLOF with sitting balance/core/BUE deficits that decrease safety with sitting unsupported and with functional transfers -Education of current recommendations for bed level bathing, dressing and toileting 2/2 poor sitting balance and limited bilateral hand function and inability to stand for toileting/LB clothing management -Bed level toileting components, including bed pan, and if incontinent- the bed level toileting management. Advised against transferring to [facility] at this time though they have one at home 2/2 poor sitting balance and pain/wound at the L residual limb and challenge of only 1 son being present at a time and need of 2 people for seated toileting -Agreed that pt would benefit from [facility] wound care and hired aid for self-care needs if they anticipate they cannot provide that for pt -Demo of rolling using hand rails with min A via chuck pad to R<>L for doffing/donning of brief (with present incontinent episode  therefore sons actually participated in toileting management at the max A level) -Donning/doffing clothing at the mod-max A level for UB and LB via rolls or sitting up in bed (with pt able to demo this during session and sons participating in task) -Hospital bed recommendation for independence and repositioning via hand rails and elevation features, however pt expressed his desire to remain with his current bed at home -Education on [facility] features including tilt, for repositioning and sitting up for increased energy expenditure, pressure relief and BP management vs being supine all the time in the bed -Demo/practice boosting pt via chuck pad up towards [facility] with total A +2  Pt and sons were very receptive to all education however anticipate that all 3 will benefit from further education to address functional needs/transfer prior to DC home.  Of note- pt's L residual limb with excessive drainage, with pillow case saturated. Secondary to family ed, therapist deferred to nursing for wound care and dressing change, and next PT notified who notified nursing of need of wound care.  Pt remained upright in bed, with bed alarm on/activated, and with all needs in reach at end of session.  Therapy Documentation Precautions:  Precautions Precautions: Fall Precaution Comments: previous R AKA, multiple digit amputations Restrictions Weight Bearing Restrictions: Yes RUE Weight Bearing: Weight bearing as tolerated RLE Weight Bearing:  (Bilateral AKA) LLE Weight Bearing: Non weight bearing    Therapy/Group: Individual Therapy  Blase Mess, MS, OTR/L  08/21/2022, 12:06 PM

## 2022-08-21 NOTE — Telephone Encounter (Signed)
This pt is s/p an AKA on 08/07/2022. He was set to d/c from the hospital yesterday. Can you please call the pt and make a post op appt with Erin for next week please?

## 2022-08-21 NOTE — Telephone Encounter (Signed)
Message left on triage vm from [place] PA with [facility] rehab called and wanted to advise that the pt is set to d/c home but is having drainage from his AKA and wanted to know if you could stop by and take a look.

## 2022-08-21 NOTE — Patient Care Conference (Signed)
Inpatient RehabilitationTeam Conference and Plan of Care Update Date: 08/21/2022   Time: 11:42 AM    Patient Name: Edward [name]      Medical Record Number: [id number]  Date of Birth: [birth year] Sex: Male         Room/Bed: 4W25C/4W25C-01 Payor Info: Payor: [employer] MEDICARE / Plan: [facility] MEDICARE / Product Type: *No Product type* /    Admit Date/Time:  08/13/2022  6:02 PM  Primary Diagnosis:  S/P bilateral above knee amputation [facility])  Hospital Problems: Principal Problem:   S/P bilateral above knee amputation [facility])    Expected Discharge Date: Expected Discharge Date: 08/26/22 (D/C date extended)  Team Members Present: Physician leading conference: Dr. Leeroy Cha Social Worker Present: Erlene Quan, BSW Nurse Present: Dorien Chihuahua, RN PT Present: Terence Lux, PT OT Present: Jennefer Bravo, OT SLP Present: Weston Anna, SLP PPS Coordinator present : Gunnar Fusi, SLP     Current Status/Progress Goal Weekly Team Focus  Bowel/Bladder   Continent to b/b. LBM: 1/1   Remain continent of b/b.   Assist w/ toileting needs q shift & PRN.    Swallow/Nutrition/ Hydration   Regular textures with thin liquids, supervision-Mod I for use of swallowing compensatory strategies   Mod I  tolerance of current diet, use of swallowing compensatory strategies    ADL's   ADLs at bed level; mod A UB, Max-total LB and toileting   Min A; downgraded to generally mod A on 1/1   family ed, DC planning, functional transfers, adaptive techniques    Mobility   rolling L/R with bedrails min A, semi-reclined<>long sitting in bed mod/max A, PA transfers mod/max A +2, maxislide transfers total/dependent +2, power WC mobility 267f supervision   min A sitting balance and bed mobility, max A transfers, supervision WC mobility - downgraded on 08/19/22  D/C planning, equipment management, decreasing caregiver burden, functional mobility/transfers, generalized strengthening and improved  activity tolerance.    Communication                Safety/Cognition/ Behavioral Observations               Pain   Occasional pain to LLE d/t L AKA. PRN OXY is given for pain management.   Keep pain < 4/10   Assess pain q shift & PRN    Skin   Incision to LLE - dressing in place   Remain free of infection or further breakdowns.  Assess skin q shift & PRN      Discharge Planning:    Home with sons  Team Discussion: Patient increased drainage from incision site; MD started on Ancef. Medications adjusted and pain is managed. Patient also congested over the weekend; ultrafiltrated during HD, with congested coughing, dry mouth on fluid restrictions.   Patient on target to meet rehab goals: no, currently needs min assist for bed mobility, mod - max assist for ant/post transfers with left lateral leaning. Goals for discharge set for min - max assist.  *See Care Plan and progress notes for long and short-term goals.   Revisions to Treatment Plan:  Downgraded goals Co-treat for therapy sessions  Teaching Needs: Safety, skin care, mediations, transfers, toileting, etc.  Current Barriers to Discharge: Decreased caregiver support, Home enviroment access/layout, and Hemodialysis  Possible Resolutions to Barriers: Family education ABX for drainage at incision [place] for entry to home; non emergent transport to home AHorm[employer] Foodsapplication DME: hospital bed, power wheelchair     Medical Summary  Current Status: hypotension, residual limn pain, congestion and cough improved, continued drainage from AKA  Barriers to Discharge: Medical stability;Complicated Wound  Barriers to Discharge Comments: hypotension, residual limn pain, congestion and cough improved, continued drainage from AKA Possible Resolutions to [employer] Focus: continue midodrine, will notify nephrology that sons would like to speak with them about filtration volume, explained to  sons need for more filtration/albumin for fluid retention, notfied vascular regarding increased drainage, continue Ancef   Continued Need for Acute Rehabilitation Level of Care: The patient requires daily medical management by a physician with specialized training in physical medicine and rehabilitation for the following reasons: Direction of a multidisciplinary physical rehabilitation program to maximize functional independence : Yes Medical management of patient stability for increased activity during participation in an intensive rehabilitation regime.: Yes Analysis of laboratory values and/or radiology reports with any subsequent need for medication adjustment and/or medical intervention. : Yes   I attest that I was present, lead the team conference, and concur with the assessment and plan of the team.   Dorien Chihuahua B 08/21/2022, 1:27 PM

## 2022-08-21 NOTE — Progress Notes (Signed)
[facility] Progress Note   Subjective:    Last HD on 1/2 with 2 kg UF.  Note this treatment was just 2 hours per charting.  Nursing states ortho came by to assess leg wound.    Review of systems:  Denies shortness of breath - slight cough  Denies n/v  Objective Vitals:   08/20/22 1730 08/20/22 1932 08/21/22 0558 08/21/22 1423  BP:  '97/70 90/70 93/67 '$  Pulse:  89 85 85  Resp:  '17 17 17  '$ Temp:  98.3 F (36.8 C) (!) 97.4 F (36.3 C) 97.6 F (36.4 C)  TempSrc:  Oral Oral   SpO2:  100% 100% 100%  Weight: 75.8 kg     Height:       Physical Exam   General adult male in bed in no acute distress HEENT normocephalic atraumatic extraocular movements intact sclera anicteric Neck supple trachea midline Lungs clear to auscultation bilaterally normal work of breathing at rest  Heart S1S2 no rub Abdomen soft nontender nondistended. Slight sacral edema  Extremities bilateral AKA - left AKA with recent wound and is wrapped.    Psych normal mood and affect Neuro - alert and conversant provides hx and follows commands  Access RIJ tunn catheter    Filed Weights   08/20/22 1338 08/20/22 1357 08/20/22 1730  Weight: 77.3 kg 77.8 kg 75.8 kg    Intake/Output Summary (Last 24 hours) at 08/21/2022 1833 Last data filed at 08/21/2022 1301 Gross per 24 hour  Intake 456 ml  Output --  Net 456 ml    Additional Objective Labs: Basic Metabolic Panel: Recent Labs  Lab 08/19/22 0655  NA 136  K 4.0  CL 93*  CO2 21*  GLUCOSE 163*  BUN 55*  CREATININE 6.74*  CALCIUM 8.3*  PHOS 7.1*   Liver Function Tests: Recent Labs  Lab 08/19/22 0655  ALBUMIN 2.5*   No results for input(s): "LIPASE", "AMYLASE" in the last 168 hours. CBC: Recent Labs  Lab 08/19/22 0655  WBC 10.8*  NEUTROABS 8.8*  HGB 10.7*  HCT 34.3*  MCV 92.7  PLT 333   Blood Culture    Component Value Date/Time   SDES BLOOD RIGHT ARM 08/05/2022 2105   SDES BLOOD RIGHT ARM 08/05/2022 2105   SPECREQUEST   08/05/2022 2105    BOTTLES DRAWN AEROBIC AND ANAEROBIC Blood Culture adequate volume   SPECREQUEST  08/05/2022 2105    BOTTLES DRAWN AEROBIC AND ANAEROBIC Blood Culture adequate volume   CULT  08/05/2022 2105    NO GROWTH 5 DAYS Performed at [facility], Start [address] [place], [place] [address]    CULT  08/05/2022 2105    NO GROWTH 5 DAYS Performed at [facility] [address], [place], [place] [address]    REPTSTATUS 08/10/2022 FINAL 08/05/2022 2105   REPTSTATUS 08/10/2022 FINAL 08/05/2022 2105    Cardiac Enzymes: No results for input(s): "CKTOTAL", "CKMB", "CKMBINDEX", "TROPONINI" in the last 168 hours. CBG: Recent Labs  Lab 08/20/22 1145 08/20/22 2049 08/21/22 0607 08/21/22 1203 08/21/22 1646  GLUCAP 217* 138* 202* 107* 191*   Iron Studies: No results for input(s): "IRON", "TIBC", "TRANSFERRIN", "FERRITIN" in the last 72 hours. Lab Results  Component Value Date   INR 1.5 (H) 05/10/2022   INR 1.2 09/28/2020   INR 1.3 (H) 09/27/2020   Studies/Results: No results found.  Medications:   ceFAZolin (ANCEF) IV 1 g (08/21/22 1158)    vitamin C  1,000 mg Oral Daily  aspirin EC  81 mg Oral Daily   B-complex with vitamin C  1 tablet Oral Daily   Chlorhexidine Gluconate Cloth  6 each Topical Daily   clopidogrel  75 mg Oral Daily   darbepoetin (ARANESP) injection - DIALYSIS  100 mcg Subcutaneous Q Sat-1800   gabapentin  100 mg Oral TID   heparin  5,000 Units Subcutaneous Q12H   insulin aspart  0-9 Units Subcutaneous TID WC   linaclotide  72 mcg Oral q AM   linagliptin  5 mg Oral Daily   midodrine  10 mg Oral TID WC   nutrition supplement (JUVEN)  1 packet Oral BID BM   pantoprazole  40 mg Oral Daily   sodium bicarbonate  650 mg Oral BID   sucroferric oxyhydroxide  1,000 mg Oral TID WC    Dialysis Orders: [facility]  4.5h  425/ 700   89kg  2/2 bath  TDC  Hep 8000+ 2041md - mircera 1542m q2, last 12/16, due 12/30 - venofer '50mg'$  IV weekly -  hectorol 1075mIV TIW - korsuva for itching  - velphoro 3 tabs with meals- last phos 12.5  Assessment/Plan: L foot wound - S/P L AKA per Dr. DudSharol Given/20. Now in CIR for rehab. ESRD -  TTS HD schedule.  Would consider alternative to peptobismol given ESRD. Stop bicarbonate Hypotension/volume: BP remains soft, has hx of hypotension at [facility] as well. Pain medication frequency decreased 08/14/2022 and again 12/29. Started midodrine here at 10 mg tid. C/o cough, will try to increase UF goal next HD as tolerated. Will need lower EDW on discharge post AKA - has gotten to 74 kg here. No Antihypertensive meds  Anemia of CKD - aranesp 100 mcg every Saturday.  Metabolic bone disease: hyperphos - binders recently resumed, will continue to titrate up if needed. Has been off of hectorol due to hypercalcemia - will resume hectorol at a lower dose of 2 mcg three times a week with HD and follow labs Abnormal LFT's - RUQ US [place] cholelithiasis and gallbladder wall thickening.  Negative US [place] sign, acute hepatitis panel negative.  Per primary team  Bradycardia: Resolved Hyperkalemia:Resolved.   Disposition  - per primary team.  In rehab  LorClaudia DesanctisD [place]3/2024,6:33 PM  LOS: 8 days

## 2022-08-21 NOTE — Progress Notes (Signed)
Physical Therapy Session Note  Patient Details  Name: Edward [name] MRN: [id number] Date of Birth: 07-01-1971  Today's Date: 08/21/2022 PT Individual Time: 0915-1015 PT Individual Time Calculation (min): 60 min   Short Term Goals: Week 1:  PT Short Term Goal 1 (Week 1): pt will perform bed mobility with mod A of 1 PT Short Term Goal 2 (Week 1): pt will transfer bed<>chair with LRAD and min A of 1 PT Short Term Goal 3 (Week 1): pt will particpate in WC propulsion up to 14f  Skilled Therapeutic Interventions/Progress Updates:     Patient in bed upon PT arrival. Patient alert and agreeable to PT session. Patient reported 7-9/10 L residual limb pain during session, RN made aware. PT provided repositioning, rest breaks, and distraction as pain interventions throughout session.   Patient's two sons present for family education and hands on training throughout session. Discussed home set-up, requesting medical transport home due to ramp not in place and patient currently unsafe for car transfers due to B AKA with multiple digit amputations. Patient does not want a hospital bed, reports there is not enough space for it. Showed patient and his sons options for folding bed rails that would provide safety and improve patients independents with bed mobility. His sons report that only one of them will be home at a time as one works nights and the other days.   Reviewed general features of a power w/c and how to operate. Emphasized safety with power w/c such as turning off the power prior to transfers, maintaining speed on lowest setting at all times at this time, placing the chair in a low and upright position during transfers and using power tilt in sitting to reduce anterior LOB with patient's new COM from B AKA.   PT demonstrated supine to/from long sit with min-mod A with patient pulling up on bed rails and A/P transfer to/from the power w/c with mod-max A with heavy cues with use of teach back method  for head-hips relationship and protecting the patient's L residual limb throughout. Patient performed rolling and PT educated on pad/sheet patient under patient's hips to assist with transfer. Patient's son then performed supine to long sit with patient and initiated A/P transfer into the power w/c, during the transfer the patient's hips caught the cushion tipping the patient forward. With +3 assist the patient was returned to the bed safely, but did put pressure on his residual limb with increased pain when tipping forward, RN made aware. Limb assessed with consistent grey purulent and serosanguinous drainage and no signs of additional trauma or new bleeding at this time. RN to assess and redress limb following session.   Emphasized importance of protecting the patient's L residual limb and performing daily skin assessments. His sons were unable to stay for nursing education due to need to return to work. Educated on not performing transfers until able to have continued training with HHPT, but encouraged patient sitting up in bed and working on upper extremity and core strengthening at this time. Patient and his sons in agreement. Would benefit from further family education prior to d/c if able.   Patient in bed with his son preparing to leave at end of session with breaks locked, bed alarm set, 4 rails up per patient request, and all needs within reach.   Therapy Documentation Precautions:  Precautions Precautions: Fall Precaution Comments: new L AKA, previous R AKA, multiple digit amputations Restrictions Weight Bearing Restrictions: Yes RUE Weight Bearing: Weight bearing  as tolerated RLE Weight Bearing:  (Bilateral AKA) LLE Weight Bearing: Non weight bearing    Therapy/Group: Individual Therapy  Rachelanne Whidby L Shaya Altamura PT, DPT, NCS, CBIS  08/21/2022, 7:32 PM

## 2022-08-22 ENCOUNTER — Other Ambulatory Visit (HOSPITAL_COMMUNITY): Payer: Self-pay

## 2022-08-22 ENCOUNTER — Inpatient Hospital Stay (HOSPITAL_COMMUNITY): Payer: Medicare Other

## 2022-08-22 LAB — CBC
HCT: 34.6 % — ABNORMAL LOW (ref 39.0–52.0)
Hemoglobin: 10.9 g/dL — ABNORMAL LOW (ref 13.0–17.0)
MCH: 29 pg (ref 26.0–34.0)
MCHC: 31.5 g/dL (ref 30.0–36.0)
MCV: 92 fL (ref 80.0–100.0)
Platelets: 299 10*3/uL (ref 150–400)
RBC: 3.76 MIL/uL — ABNORMAL LOW (ref 4.22–5.81)
RDW: 19 % — ABNORMAL HIGH (ref 11.5–15.5)
WBC: 9.4 10*3/uL (ref 4.0–10.5)
nRBC: 0.2 % (ref 0.0–0.2)

## 2022-08-22 LAB — RENAL FUNCTION PANEL
Albumin: 2.5 g/dL — ABNORMAL LOW (ref 3.5–5.0)
Anion gap: 21 — ABNORMAL HIGH (ref 5–15)
BUN: 55 mg/dL — ABNORMAL HIGH (ref 6–20)
CO2: 22 mmol/L (ref 22–32)
Calcium: 8.5 mg/dL — ABNORMAL LOW (ref 8.9–10.3)
Chloride: 92 mmol/L — ABNORMAL LOW (ref 98–111)
Creatinine, Ser: 7.08 mg/dL — ABNORMAL HIGH (ref 0.61–1.24)
GFR, Estimated: 9 mL/min — ABNORMAL LOW (ref >60)
Glucose, Bld: 141 mg/dL — ABNORMAL HIGH (ref 70–99)
Phosphorus: 4.2 mg/dL (ref 2.5–4.6)
Potassium: 3.8 mmol/L (ref 3.5–5.1)
Sodium: 135 mmol/L (ref 135–145)

## 2022-08-22 LAB — GLUCOSE, CAPILLARY
Glucose-Capillary: 141 mg/dL — ABNORMAL HIGH (ref 70–99)
Glucose-Capillary: 156 mg/dL — ABNORMAL HIGH (ref 70–99)
Glucose-Capillary: 99 mg/dL (ref 70–99)

## 2022-08-22 MED ORDER — LINACLOTIDE 72 MCG PO CAPS
72.0000 ug | ORAL_CAPSULE | Freq: Every morning | ORAL | 0 refills | Status: DC
  Filled 2022-08-22: qty 30, 30d supply, fill #0

## 2022-08-22 MED ORDER — BENZONATATE 100 MG PO CAPS: 100.0000 mg | ORAL_CAPSULE | Freq: Two times a day (BID) | ORAL | Status: DC | PRN

## 2022-08-22 MED ORDER — ATORVASTATIN CALCIUM 80 MG PO TABS
80.0000 mg | ORAL_TABLET | Freq: Every day | ORAL | 0 refills | Status: DC
  Filled 2022-08-22: qty 30, 30d supply, fill #0

## 2022-08-22 MED ORDER — ALLOPURINOL 100 MG PO TABS
100.0000 mg | ORAL_TABLET | Freq: Every day | ORAL | 0 refills | Status: DC | PRN
  Filled 2022-08-22: qty 30, 30d supply, fill #0

## 2022-08-22 MED ORDER — OXYCODONE HCL 5 MG PO TABS
5.0000 mg | ORAL_TABLET | Freq: Two times a day (BID) | ORAL | 0 refills | Status: DC | PRN
  Filled 2022-08-22 (×2): qty 30, 15d supply, fill #0

## 2022-08-22 MED ORDER — ACETAMINOPHEN 325 MG PO TABS: 325.0000 mg | ORAL_TABLET | Freq: Four times a day (QID) | ORAL | Status: DC | PRN

## 2022-08-22 MED ORDER — PANTOPRAZOLE SODIUM 40 MG PO TBEC
40.0000 mg | DELAYED_RELEASE_TABLET | Freq: Every day | ORAL | 0 refills | Status: DC
  Filled 2022-08-22: qty 30, 30d supply, fill #0

## 2022-08-22 MED ORDER — ALBUMIN HUMAN 25 % IV SOLN
INTRAVENOUS | Status: AC
  Administered 2022-08-22: 25 g
  Filled 2022-08-22: qty 100

## 2022-08-22 MED ORDER — VITAMIN B COMPLEX PO TABS
1.0000 | ORAL_TABLET | Freq: Every day | ORAL | 0 refills | Status: DC
  Filled 2022-08-22: qty 30, 30d supply, fill #0

## 2022-08-22 MED ORDER — HEPARIN SODIUM (PORCINE) 1000 UNIT/ML IJ SOLN
INTRAMUSCULAR | Status: AC
  Administered 2022-08-22: 3800 [IU]
  Filled 2022-08-22: qty 4

## 2022-08-22 MED ORDER — ALPRAZOLAM 0.25 MG PO TABS
0.2500 mg | ORAL_TABLET | Freq: Every day | ORAL | 0 refills | Status: DC
  Filled 2022-08-22: qty 10, 10d supply, fill #0

## 2022-08-22 MED ORDER — EZETIMIBE 10 MG PO TABS
10.0000 mg | ORAL_TABLET | Freq: Every day | ORAL | 0 refills | Status: DC
  Filled 2022-08-22: qty 30, 30d supply, fill #0

## 2022-08-22 MED ORDER — ONDANSETRON HCL 4 MG PO TABS
4.0000 mg | ORAL_TABLET | Freq: Three times a day (TID) | ORAL | Status: DC | PRN
  Administered 2022-08-22: 4 mg via ORAL
  Filled 2022-08-22 (×2): qty 1

## 2022-08-22 MED ORDER — MIDODRINE HCL 10 MG PO TABS
10.0000 mg | ORAL_TABLET | Freq: Three times a day (TID) | ORAL | 0 refills | Status: DC
  Filled 2022-08-22: qty 90, 30d supply, fill #0

## 2022-08-22 MED ORDER — VELPHORO 500 MG PO CHEW
1000.0000 mg | CHEWABLE_TABLET | Freq: Three times a day (TID) | ORAL | 0 refills | Status: DC
  Filled 2022-08-22: qty 90, 15d supply, fill #0

## 2022-08-22 MED ORDER — CLOPIDOGREL BISULFATE 75 MG PO TABS
75.0000 mg | ORAL_TABLET | Freq: Every day | ORAL | 0 refills | Status: DC
  Filled 2022-08-22: qty 30, 30d supply, fill #0

## 2022-08-22 MED ORDER — GABAPENTIN 100 MG PO CAPS
100.0000 mg | ORAL_CAPSULE | Freq: Three times a day (TID) | ORAL | 0 refills | Status: DC
  Filled 2022-08-22: qty 90, 30d supply, fill #0

## 2022-08-22 MED ORDER — LINAGLIPTIN 5 MG PO TABS
5.0000 mg | ORAL_TABLET | Freq: Every day | ORAL | 0 refills | Status: DC
  Filled 2022-08-22: qty 30, 30d supply, fill #0

## 2022-08-22 MED ORDER — ASCORBIC ACID 1000 MG PO TABS
1000.0000 mg | ORAL_TABLET | Freq: Every day | ORAL | 0 refills | Status: DC
  Filled 2022-08-22: qty 30, 30d supply, fill #0

## 2022-08-22 NOTE — Progress Notes (Signed)
Occupational Therapy Discharge Summary  Patient Details  Name: Edward [name] MRN: [id number] Date of Birth: [birth year]  Date of Discharge from OT service:August 22, 2022  Patient has met 2 of 5 long term goals due to improved activity tolerance, ability to compensate for deficits, and improved coordination.  Patient to discharge at overall Max Assist at bed level. Patient's care partner is independent to provide the necessary physical and cognitive assistance at discharge.  Pt's sons attended family education training on 1/4 and practiced with bed mobility and bed level toileting/ADLs. Recommended having pt's son's return for further family education however due to amount of physical assist pt currently requires, however unable to coordinate with work schedule. Primary PT/OT recommended extending D/C date to allow for further practice with hands on training to ensure safe mobility at home, however pt refused and insisting on discharging on 1/4.    Reasons goals not met: Pt did not meet dynamic sitting balance at supervision vs min A due to poor trunk control and generalized endurance/postural deficits. Pt did not meet Lower body dressing or toileting goal at mod A vs max-total A due to limited digits on BUE, generalized endurance, core and strength deficits.  Recommendation:  Patient will benefit from ongoing skilled OT services in home health setting to continue to advance functional skills in the area of BADL.  Equipment: None at this time  Reasons for discharge: treatment goals met and discharge from hospital  Patient/family agrees with progress made and goals achieved: Yes  OT Discharge Precautions/Restrictions  Precautions Precautions: Fall Precaution Comments: new L AKA, previous R AKA, multiple digit amputations Restrictions Weight Bearing Restrictions: Yes LLE Weight Bearing: Non weight bearing ADL ADL Equipment Provided: Other (comment) (built up handle for tooth brush & bath  mit) Eating: Set up Where Assessed-Eating: Wheelchair Grooming: Setup Where Assessed-Grooming: Sitting at sink Upper Body Bathing: Supervision/safety Where Assessed-Upper Body Bathing: Bed level Lower Body Bathing: Moderate assistance Where Assessed-Lower Body Bathing: Bed level Upper Body Dressing: Contact guard Where Assessed-Upper Body Dressing: Wheelchair, Bed level Lower Body Dressing: Maximal assistance Where Assessed-Lower Body Dressing: Bed level Toileting: Maximal assistance Where Assessed-Toileting: Bed level Toilet Transfer: Other (comment) (Max +2 assist) Toilet Transfer Method: Other (comment) (PA/AP transfer) Toilet Transfer Equipment: Extra wide drop arm bedside commode Tub/Shower Transfer: Unable to assess Tub/Shower Transfer Method: Unable to assess Social research officer, government: Unable to assess Social research officer, government Method: Unable to assess Vision Baseline Vision/History: 0 No visual deficits Patient Visual Report: No change from baseline Vision Assessment?: No apparent visual deficits Perception  Perception: Within Functional Limits Praxis Praxis: Intact Cognition Cognition Overall Cognitive Status: Within Functional Limits for tasks assessed Arousal/Alertness: Lethargic Orientation Level: Place;Person;Situation Person: Oriented Place: Oriented Situation: Oriented Memory: Appears intact Awareness: Appears intact Problem Solving: Impaired Safety/Judgment: Appears intact Brief Interview for Mental Status (BIMS) Repetition of Three Words (First Attempt): 3 Temporal Orientation: Year: Correct Temporal Orientation: Month: Accurate within 5 days Temporal Orientation: Day: Correct Recall: "Sock": Yes, no cue required Recall: "Blue": Yes, no cue required Recall: "Bed": Yes, no cue required BIMS Summary Score: 15 Sensation Sensation Light Touch: Appears Intact Hot/Cold: Impaired by gross assessment (BUE) Proprioception: Impaired by gross assessment  (BUE) Stereognosis: Impaired by gross assessment (BUE) Coordination Gross Motor Movements are Fluid and Coordinated: No Fine Motor Movements are Fluid and Coordinated: No Coordination and Movement Description: grossly uncoordinated due to multiple digit amputations, bilateral AKA, pain, and decreased balance strategies Finger Nose Finger Test: difficulty to perform 2/2 multiple digit amputations  and coordination deficits Motor  Motor Motor: Abnormal postural alignment and control Motor - Skilled Clinical Observations: grossly uncoordinated due to multiple digit amputation, bilateral AKA, pain, and decreased balance strategies Mobility  Bed Mobility Bed Mobility: Rolling Right;Rolling Left;Sit to Supine;Supine to Sit Rolling Right: Minimal Assistance - Patient > 75% Rolling Left: Minimal Assistance - Patient > 75% Supine to Sit: 2 Helpers Sit to Supine: Minimal Assistance - Patient > 75%  Trunk/Postural Assessment  Cervical Assessment Cervical Assessment: Within Functional Limits Thoracic Assessment Thoracic Assessment: Exceptions to [facility] Lumbar Assessment Lumbar Assessment: Exceptions to [facility] Postural Control Postural Control: Deficits on evaluation Trunk Control: posterior lean with generalized unsteadiness and lateral LOB in both directions Righting Reactions: delayed due to bilateral AKA  Balance Balance Balance Assessed: Yes Static Sitting Balance Static Sitting - Balance Support: Bilateral upper extremity supported (bedrails) Static Sitting - Level of Assistance: 5: Stand by assistance (supervision) Static Sitting - Comment/# of Minutes: in long sitting Dynamic Sitting Balance Dynamic Sitting - Balance Support: No upper extremity supported Dynamic Sitting - Level of Assistance: 4: Min assist Dynamic Sitting Balance - Compensations: in long sitting Sitting balance - Comments: pt relies heavily on external BUE support Extremity/Trunk Assessment RUE Assessment RUE  Assessment: Exceptions to [facility] Strength Comments: AV fistula. 2nd, 3rd, and 5th digit amputation.  A/ROM WFL in shoulder, elbow, and wrist ranges. Shoulder flexion: 4-/5, abduction 4/5, IR/er: 4/5 Impaired functional grasp 2/2 lack of digits LUE Assessment LUE Assessment: Exceptions to [facility] Strength Comments: AV fistula. 2nd, 4th, and 5th digit amputation. A/ROM WFL in shoulder, elbow, and wrist ranges. Shoulder flexion: 4-/5, abduction 3+/5, IR/er: 4-/5 Impaired functional grasp 2/2 lack of digits   Tyrica Afzal E Marieelena Bartko, MS, OTR/L  08/23/2022, 3:53 PM

## 2022-08-22 NOTE — Progress Notes (Signed)
Speech Language Pathology Daily Session Note  Patient Details  Name: Edward [name] MRN: [id number] Date of Birth: [birth year]  Today's Date: 08/22/2022 SLP Individual Time: 0730-0749 SLP Individual Time Calculation (min): 19 min  Short Term Goals: Week 1: SLP Short Term Goal 1 (Week 1): STG = LTG d/t ELOS  Skilled Therapeutic Interventions: Attempted to see pt this date for ongoing diet tolerance monitoring; however, pt reporting nausea/vomiting since last evening and general feeling of being unwell. Congested cough also noted. SLP provided re-education re: upright and midline positioning in bed, particularly with emesis episodes and hard L lean. Of note, pt attempting to self-correct L lean, though required Mod-Max A (from a physical standpoint) to successfully do so. Halfway through today's session, pt with another emesis episode; pt positioned >50 degrees in bed with emesis bag present. Pt with active coughing during this episode. SLP provided wash cloth and set-up A for oral care following emesis. Pt did decline PO trials, though was appreciative of assistance in self-care. SLP left pt in bed with all safety measures intact and all immediate needs met. Discussed cough with RN who sent a secure Epic chat to MD. Pt reporting he did not feel ready to discharge at this time. Continue per current ST POC, if pt remains admitted (this was planned to be pt's grad day from therapy).  Due to illness, pt missed 26 minutes of skilled ST intervention; will make up as able.  Pain Pt with general discomfort; provided repositioning and comfort measures (oral care, wet wash cloth, etc.)  Therapy/Group: Individual Therapy  Yoana Staib A Zachary Nole 08/22/2022, 7:57 AM

## 2022-08-22 NOTE — Progress Notes (Signed)
[name] Progress Note   Subjective:    Seen and examined on dialysis.  Blood pressure 100/71 and HR 80.  Tolerating goal.  He got albumin.  Was restarted once.   Review of systems:  Denies shortness of breath - states cough better Denies n/v  Objective Vitals:   08/22/22 1745 08/22/22 1759 08/22/22 1815 08/22/22 1831  BP: 102/74 109/76 96/71 101/68  Pulse:  81 80 80  Resp: '17 14 10 15  '$ Temp:      TempSrc:      SpO2:  99%    Weight:      Height:       Physical Exam   General adult male in bed in no acute distress HEENT normocephalic atraumatic extraocular movements intact sclera anicteric Neck supple trachea midline Lungs clear to auscultation bilaterally normal work of breathing at rest  Heart S1S2 no rub Abdomen soft nontender nondistended. Slight sacral edema  Extremities bilateral AKA - left AKA with recent wound and is wrapped.    Psych normal mood and affect Neuro - alert and conversant provides hx and follows commands  Access RIJ tunn catheter    Filed Weights   08/20/22 1357 08/20/22 1730 08/22/22 1501  Weight: 77.8 kg 75.8 kg 76 kg    Intake/Output Summary (Last 24 hours) at 08/22/2022 1850 Last data filed at 08/22/2022 0805 Gross per 24 hour  Intake 0 ml  Output --  Net 0 ml    Additional Objective Labs: Basic Metabolic Panel: Recent Labs  Lab 08/19/22 0655 08/22/22 0544  NA 136 135  K 4.0 3.8  CL 93* 92*  CO2 21* 22  GLUCOSE 163* 141*  BUN 55* 55*  CREATININE 6.74* 7.08*  CALCIUM 8.3* 8.5*  PHOS 7.1* 4.2   Liver Function Tests: Recent Labs  Lab 08/19/22 0655 08/22/22 0544  ALBUMIN 2.5* 2.5*   No results for input(s): "LIPASE", "AMYLASE" in the last 168 hours. CBC: Recent Labs  Lab 08/19/22 0655 08/22/22 0544  WBC 10.8* 9.4  NEUTROABS 8.8*  --   HGB 10.7* 10.9*  HCT 34.3* 34.6*  MCV 92.7 92.0  PLT 333 299   Blood Culture    Component Value Date/Time   SDES BLOOD RIGHT ARM 08/05/2022 2105   SDES BLOOD  RIGHT ARM 08/05/2022 2105   SPECREQUEST  08/05/2022 2105    BOTTLES DRAWN AEROBIC AND ANAEROBIC Blood Culture adequate volume   SPECREQUEST  08/05/2022 2105    BOTTLES DRAWN AEROBIC AND ANAEROBIC Blood Culture adequate volume   CULT  08/05/2022 2105    NO GROWTH 5 DAYS Performed at [facility] [address] [place], [place] [address]    CULT  08/05/2022 2105    NO GROWTH 5 DAYS Performed at [facility] [address] [place], [place] [address]    REPTSTATUS 08/10/2022 FINAL 08/05/2022 2105   REPTSTATUS 08/10/2022 FINAL 08/05/2022 2105    Cardiac Enzymes: No results for input(s): "CKTOTAL", "CKMB", "CKMBINDEX", "TROPONINI" in the last 168 hours. CBG: Recent Labs  Lab 08/21/22 1203 08/21/22 1646 08/21/22 2133 08/22/22 0535 08/22/22 1158  GLUCAP 107* 191* 86 141* 156*   Iron Studies: No results for input(s): "IRON", "TIBC", "TRANSFERRIN", "FERRITIN" in the last 72 hours. Lab Results  Component Value Date   INR 1.5 (H) 05/10/2022   INR 1.2 09/28/2020   INR 1.3 (H) 09/27/2020   Studies/Results: DG Chest 2 View  Result Date: 08/22/2022 CLINICAL DATA:  Cough, CHF, renal dysfunction EXAM: CHEST -  2 VIEW COMPARISON:  Previous studies including the examination of 08/16/2022 FINDINGS: Transverse diameter of heart is increased. Central pulmonary vessels are slightly less prominent. There are no signs of alveolar pulmonary edema or new focal infiltrates. There is no pleural effusion or pneumothorax. Tip of dialysis catheter is seen in the region of right atrium. IMPRESSION: Cardiomegaly. There is decrease in pulmonary vascular congestion. There are no signs of pulmonary edema or new focal infiltrates. Electronically Signed   By: Elmer Picker M.D.   On: 08/22/2022 10:09    Medications:   ceFAZolin (ANCEF) IV 1 g (08/22/22 1249)    vitamin C  1,000 mg Oral Daily   aspirin EC  81 mg Oral Daily   B-complex with vitamin C  1 tablet Oral Daily   Chlorhexidine  Gluconate Cloth  6 each Topical Daily   Chlorhexidine Gluconate Cloth  6 each Topical Q0600   clopidogrel  75 mg Oral Daily   darbepoetin (ARANESP) injection - DIALYSIS  100 mcg Subcutaneous Q Sat-1800   doxercalciferol  2 mcg Intravenous Q T,Th,Sa-HD   gabapentin  100 mg Oral TID   heparin  5,000 Units Subcutaneous Q12H   insulin aspart  0-9 Units Subcutaneous TID WC   linaclotide  72 mcg Oral q AM   linagliptin  5 mg Oral Daily   midodrine  10 mg Oral TID WC   nutrition supplement (JUVEN)  1 packet Oral BID BM   pantoprazole  40 mg Oral Daily   sucroferric oxyhydroxide  1,000 mg Oral TID WC    Dialysis Orders: [facility]  4.5h  425/ 700   89kg  2/2 bath  TDC  Hep 8000+ 2051md - mircera 1571m q2, last 12/16, due 12/30 - venofer '50mg'$  IV weekly - hectorol 1046mIV TIW - korsuva for itching  - velphoro 3 tabs with meals- last phos 12.5  Assessment/Plan: L foot wound - S/P L AKA per Dr. DudSharol Given/20. Now in CIR for rehab. ESRD -  TTS HD schedule.  Would consider alternative to peptobismol given ESRD. Stop bicarbonate.  Albumin again with next HD Hypotension/volume: BP remains soft, has hx of hypotension at OP Clinic as well. Pain medication frequency decreased 08/14/2022 and again 12/29. Started midodrine here at 10 mg tid. UF as tolerated.  Will need lower EDW on discharge post AKA - has gotten to 74 kg here. No Antihypertensive meds  Anemia of CKD - aranesp 100 mcg every Saturday.  Metabolic bone disease: hyperphos - binders recently resumed, will continue to titrate up if needed. Has been off of hectorol due to hypercalcemia - will resume hectorol at a lower dose of 2 mcg three times a week with HD and follow labs Abnormal LFT's - RUQ US [place]owed cholelithiasis and gallbladder wall thickening.  Negative US [place] sign, acute hepatitis panel negative.  Per primary team  Bradycardia: Resolved Hyperkalemia:Resolved.   Disposition  - per primary team.  In rehab  LorClaudia DesanctisMD [place]4/2024,6:50 PM  LOS: 9 days

## 2022-08-22 NOTE — Progress Notes (Signed)
Contacted [facility] yesterday to advise clinic that pt should d/c tomorrow and resume care on Saturday.   Melven Sartorius Renal Navigator [phone]

## 2022-08-22 NOTE — Progress Notes (Signed)
Patient ID: Edward [name], male   DOB: 1971-03-10, 52 y.o.   MRN: [id number]  SW received follow up from patient son. Brother potentially will be able to attend education this afternoon. Son feel comfortable with patient returning home tomorrow and caring for him. No additional questions or concerns.

## 2022-08-22 NOTE — Progress Notes (Signed)
Taken patient from his room ,going to his HD treatment .Awake ,alert and oriented x 3.Vitals stable though blood pressure was soft.Denies pain.  Access used : Right HD catheter that works well.Dressing change done. No retaining suture at the hd catheter but the insertion site is healed,no signs of being pulled out.  Duration of treatment : 3.5 hours.  Medicines given: 25g Albumin at the start of treatment.                             2 MCG Hectorol.  Fluid removed.    1,500  Hemodialysis treatment issue..Even with a Albumin given at the start of the treatment ,his blood pressure were soft for the first and one half hour of treatment. Renal MD at the bedside.

## 2022-08-22 NOTE — Progress Notes (Signed)
Recreational Therapy Discharge Summary Patient Details  Name: OSMOND STECKMAN MRN: [id number] Date of Birth: [birth year] Today's Date: 08/22/2022   Comments on progress toward goals: Pt being discharged from TR services as pt has had low activity tolerance and scheduled sessions/cotreats have been focused on personal hygiene and bed mobility.  Pt has a scheduled discharge date of tomorrow home with family. Reasons for discharge: discharge from hospital  Follow-up: Emily agrees with progress made and goals achieved: Yes  Donovin Kraemer 08/22/2022, 12:48 PM

## 2022-08-22 NOTE — Progress Notes (Signed)
PROGRESS NOTE   Subjective/Complaints: +emesis, feels like he ate spoiled food for dinner as he felt coppery taste in mouth after eating. Felt better after throwing up but now nauseous again  ROS:   Pt denies SOB, abd pain, CP,  + cough- improved, +emesis   Objective:   DG Chest 2 View  Result Date: 08/22/2022 CLINICAL DATA:  Cough, CHF, renal dysfunction EXAM: CHEST - 2 VIEW COMPARISON:  Previous studies including the examination of 08/16/2022 FINDINGS: Transverse diameter of heart is increased. Central pulmonary vessels are slightly less prominent. There are no signs of alveolar pulmonary edema or new focal infiltrates. There is no pleural effusion or pneumothorax. Tip of dialysis catheter is seen in the region of right atrium. IMPRESSION: Cardiomegaly. There is decrease in pulmonary vascular congestion. There are no signs of pulmonary edema or new focal infiltrates. Electronically Signed   By: Elmer Picker M.D.   On: 08/22/2022 10:09   Recent Labs    08/22/22 0544  WBC 9.4  HGB 10.9*  HCT 34.6*  PLT 299    Recent Labs    08/22/22 0544  NA 135  K 3.8  CL 92*  CO2 22  GLUCOSE 141*  BUN 55*  CREATININE 7.08*  CALCIUM 8.5*     Intake/Output Summary (Last 24 hours) at 08/22/2022 1034 Last data filed at 08/22/2022 0805 Gross per 24 hour  Intake 236 ml  Output --  Net 236 ml        Physical Exam: Vital Signs Blood pressure 91/60, pulse 91, temperature 97.8 F (36.6 C), resp. rate 18, height 3' 11.24" (1.2 m), weight 75.8 kg, SpO2 100 %. Gen: no distress, normal appearing, BMI 52.64 HEENT: oral mucosa pink and moist, NCAT Cardio: Reg rate Pulmonary: CTA B/L; no W/R/R- good air movement, +cough GI: soft, NT, ND, (+)BS Psychiatric: appropriate- a little flat, wandering slightly, but got back on topic easily Neurological: alert- STM a little decreased/noted Skin: wound vac removed, amputated fingers- Healed  R AKA and wrapped with ACE wrap L AKA- which is new Fistula L upper arm Neuro: Alert and oriented to person and place.  Knows December but does not know year or day.  Follows commands.  Cranial nerves II through XII intact. Sensation to light touch intact in all 4 extremities Strength 5 out of 5 bilateral shoulder abduction, elbow flexion and extension Able to lift bilateral lower extremities Musculoskeletal: Patient with multiple finger amputations. He has amputations of right second, third and fifth digits at MCP and fourth digit PIP.  He has amputations of the left second and fourth digits.  Left third and fifth digits with flexion contractures.  Right AKA is well-healed.  Left AKA with wound VAC dressing in place draining serosanguineous fluid.  Appropriately tender  No joint swelling noted. Small amount of drainage present  Left sided lean Right IJ TDC Right upper extremity IV   Assessment/Plan: 1. Functional deficits which require 3+ hours per day of interdisciplinary therapy in a comprehensive inpatient rehab setting. Physiatrist is providing close team supervision and 24 hour management of active medical problems listed below. Physiatrist and rehab team continue to assess barriers to discharge/monitor patient progress toward  functional and medical goals  Care Tool:  Bathing    Body parts bathed by patient: Right arm, Left arm, Chest, Abdomen   Body parts bathed by helper: Front perineal area, Buttocks Body parts n/a: Left lower leg, Right lower leg   Bathing assist Assist Level: Moderate Assistance - Patient 50 - 74%     Upper Body Dressing/Undressing Upper body dressing   What is the patient wearing?: Pull over shirt    Upper body assist Assist Level: Minimal Assistance - Patient > 75%    Lower Body Dressing/Undressing Lower body dressing      What is the patient wearing?: Pants, Incontinence brief     Lower body assist Assist for lower body dressing: Total  Assistance - Patient < 25%     Toileting Toileting Toileting Activity did not occur (Clothing management and hygiene only): N/A (no void or bm)  Toileting assist Assist for toileting: Total Assistance - Patient < 25%     Transfers Chair/bed transfer  Transfers assist  Chair/bed transfer activity did not occur: Safety/medical concerns  Chair/bed transfer assist level: 2 Helpers     Locomotion Ambulation   Ambulation assist   Ambulation activity did not occur: Safety/medical concerns (bilateral AKA, pain)          Walk 10 feet activity   Assist  Walk 10 feet activity did not occur: Safety/medical concerns (bilateral AKA, pain)        Walk 50 feet activity   Assist Walk 50 feet with 2 turns activity did not occur: Safety/medical concerns (bilateral AKA, pain)         Walk 150 feet activity   Assist Walk 150 feet activity did not occur: Safety/medical concerns (bilateral AKA, pain)         Walk 10 feet on uneven surface  activity   Assist Walk 10 feet on uneven surfaces activity did not occur: Safety/medical concerns (bilateral AKA, pain)         Wheelchair     Assist Is the patient using a wheelchair?: Yes Type of Wheelchair: Manual Wheelchair activity did not occur: Safety/medical concerns (multiple digit amputations, bilateral AKA)         Wheelchair 50 feet with 2 turns activity    Assist    Wheelchair 50 feet with 2 turns activity did not occur: Safety/medical concerns (multiple digit amputations, bilateral AKA)       Wheelchair 150 feet activity     Assist  Wheelchair 150 feet activity did not occur: Safety/medical concerns (multiple digit amputations, bilateral AKA)       Blood pressure 91/60, pulse 91, temperature 97.8 F (36.6 C), resp. rate 18, height 3' 11.24" (1.2 m), weight 75.8 kg, SpO2 100 %.  Medical Problem List and Plan: 1. Functional deficits secondary to left AKA 08/05/2022 due to gangrenous  changes.  Status post wound VAC.  History of right TKA 01/08/2022             -patient may shower             -ELOS/Goals: 12-14 days , PT/OT min assist            Continue CIR- PT, and OT-   May benefit for a couple more days of family ed.  2. Impaired mobility: order power wheelchair, will need to teach family members safe transfers -DVT/anticoagulation:  Pharmaceutical: continue Heparin             -antiplatelet therapy: Aspirin 81 mg daily and Plavix 75  mg daily 3. Pain Management: continue Neurontin 100 mg 3 times daily, decrease oxycodone to BID as needed. Add vitamin C 4. Anxiety: Xanax d/ced given hypotension, discussed with patient 5. Neuropsych/cognition: This patient is capable of making decisions on his own behalf. 6. Residual limb incision: contacted vascular given increased purulent drainage.  7. Fluids/Electrolytes/Nutrition: Routine in and outs with follow-up chemistries 8.  End-stage renal disease.  Hemodialysis as directed per Nephrology. TTS schedule. Will inform nephrology that son would like to speak with them 56.  Acute on chronic anemia. Last HGB improved to 10.7, monitor outpatient 10.  Diabetes mellitus.  Hemoglobin A1c 6.3.  Tradjenta 5 mg daily/SSI. CBGs well controlled  12/31- CBGs controlled- con't regimen 11.  Chronic orthostasis.  ProAmatine 10 mg 3 times daily.  Monitor with increased mobility. Monitor fluid intake. 12.  Constipation.  Linzess daily and MiraLAX as needed. LBM 12/26  12/30- LBM yesterday   12/31- BM this AM- small 13.  GERD.  Protonix 14.  Chronic combined CHF.  Ejection fraction 45%.  Monitor for any signs of fluid overload. Daily weights  d/w renal- will do HD with albumin Tuesday, discussed plan to take greater volume off to prevent fluid overload.  15. Hyperkalemia. Resolved. continue HD per nephrology. 16. Acute transaminitis and cholelithiasis. monitor LFTs.  12/30- resolved 17. Hypotension: d/c xanax, advised to only give additional  midodrine if symptomatic.  18. Pruritus: hydrocortisone ordered 19. Loose stool: d/c nepro, messaged nursing to see if stools are still loose.  20. Cough: CXR ordered and shows decrease in pulmonary congestion, continue HD, Continue SLP. Discussed importance of repositioning before eating.  21. L AKA cellulitis: Ancef started. Notified ortho of continued drainage. Plan for staple removal today 22. Emesis: zofran given      LOS: 9 days A FACE TO FACE EVALUATION WAS PERFORMED  Clide Deutscher Caliph Bor[place]ak 08/22/2022, 10:34 AM

## 2022-08-22 NOTE — Progress Notes (Signed)
Occupational Therapy Session Note  Patient Details  Name: Edward [name] MRN: [id number] Date of Birth: 1970/08/27  Today's Date: 08/22/2022 OT Individual Time: 1050-1200 OT Individual Time Calculation (min): 70 min    Short Term Goals: Week 1:  OT Short Term Goal 1 (Week 1): Patient will increased LB dressing at bed level to Max A. OT Short Term Goal 2 (Week 1): Patient will demonstrate improved functional performance during toilet transfers utilizing most appropriate technique with Mod A x1.  Skilled Therapeutic Interventions/Progress Updates:  Skilled OT intervention completed with focus on bed level ADLs, wound care and d/c planning. Pt received supine in bed, reporting a bad night with frequent vomiting/loose stool, though no pain. Per nurse/MD, pt had chest x-ray with results all clear, however pt with different appearance/complexion and overall decline in arousal. OT expressed concern to pt and care team about pt status and his medical stability with advisement to extend pt's length of stay, however pt adamant about going home and sons as of yesterday verbalized readiness to assist pt at his CLOF.  L residual limb noted to have red drainage seeping through gauze and onto chuck pad. Nursing notified of status with nurse in room to remove staples. Residual limb noted to have about 1.5 inch open gap along his incision, therefore OT advised that pad/gauze be applied vs shrinker as pt only had gray shrinker vs black. OT took over after staple removal, with application of abdominal pad, then gauze in a figure 8 method for evenly distributed pressure.   Pt remained lethargic, slow to initiate and with frequent coughing and rest breaks needed for fatigue. Pt able to roll R<>L with use of bed rails with min A, for removal of soiled chuck pad and removal of brief per request (due to discomfort). Pt bathed UB with set up A, declined LB bathing. New gown donned with min A. +2 total A utilized for  boosting towards HOB.  Pt remained semi-supine in bed, with bed alarm on/activated, and with all needs in reach at end of session.   Therapy Documentation Precautions:  Precautions Precautions: Fall Precaution Comments: new L AKA, previous R AKA, multiple digit amputations Restrictions Weight Bearing Restrictions: Yes RUE Weight Bearing: Weight bearing as tolerated RLE Weight Bearing:  (AKA) LLE Weight Bearing: Non weight bearing    Therapy/Group: Individual Therapy  Blase Mess, MS, OTR/L  08/22/2022, 1:04 PM

## 2022-08-22 NOTE — Progress Notes (Signed)
Speech Language Pathology Discharge Summary  Patient Details  Name: Edward [name] MRN: [id number] Date of Birth: [birth year]  Date of Discharge from [place] service:August 22, 2022   Patient has met 2 of 2 long term goals.  Patient to discharge at overall Modified Independent level.  Reasons goals not met: N/A   Clinical Impression/Discharge Summary:  During CIR admission, pt all long-term goals set. Pt education completed and pt tolerating regular diet with thin liquids at the Mod I level for implementation of aspiration and reflux precautions. No f/u ST needs at this time. Will require supervision and assistance from caregivers at discharge.  Care Partner:  Caregiver Able to Provide Assistance: Yes  Type of Caregiver Assistance: Physical  Recommendation:  None      Equipment: N/A   Reasons for discharge: Discharged from hospital   Patient/Family Agrees with Progress Made and Goals Achieved: Yes    Anayeli Arel A Darlena Koval 08/23/2022, 7:29 PM

## 2022-08-22 NOTE — Progress Notes (Signed)
Staples removed per order. Moderate serosanguineous drainage noted from site. Re-dressed per order.  Sheela Stack, LPN

## 2022-08-22 NOTE — Progress Notes (Signed)
Pt had about 127m brown emesis this shift. Pt denies nausea/hiccups/abd pain. Stated he feels better after vomiting. Noted with productive cough.

## 2022-08-22 NOTE — Telephone Encounter (Signed)
Pt is actually still in hospital set to d/c tomorrow after family has training classes at hospital. Will hold and continue to monitor and call for follow up appt next week.

## 2022-08-23 ENCOUNTER — Other Ambulatory Visit (HOSPITAL_COMMUNITY): Payer: Self-pay

## 2022-08-23 LAB — GLUCOSE, CAPILLARY
Glucose-Capillary: 125 mg/dL — ABNORMAL HIGH (ref 70–99)
Glucose-Capillary: 144 mg/dL — ABNORMAL HIGH (ref 70–99)

## 2022-08-23 NOTE — Progress Notes (Signed)
Inpatient Rehabilitation Discharge Medication Review by a Pharmacist  A complete drug regimen review was completed for this patient to identify any potential clinically significant medication issues.  High Risk Drug Classes Is patient taking? Indication by Medication  Antipsychotic No   Anticoagulant No   Antibiotic No   Opioid Yes Oxycodone for pain  Antiplatelet Yes ASA and plavix for CAD  Hypoglycemics/insulin Yes Tradjenta,  novolog SSI for hyperglycemia  Vasoactive Medication Yes Midodrine for hypotension  Chemotherapy No   Other Yes Atorvastatin, Zetia - HLD Vitamin C-wound care Zinc-wound care Alprazolam-anxiety Gabapentin-pain Linaclotide-constipation Pantoprazole-GERD Sodium bicarbonate-acidosis Velphoro-phos binding Allopurinol -gout     Type of Medication Issue Identified Description of Issue Recommendation(s)  Drug Interaction(s) (clinically significant)     Duplicate Therapy     Allergy     No Medication Administration End Date     Incorrect Dose     Additional Drug Therapy Needed     Significant med changes from prior encounter (inform family/care partners about these prior to discharge).    Other       Clinically significant medication issues were identified that warrant physician communication and completion of prescribed/recommended actions by midnight of the next day:  No  Name of provider notified for urgent issues identified:   Provider Method of Notification:   Pharmacist comments:   Time spent performing this drug regimen review (minutes):  20  Thank you for allowing pharmacy to be part of this patients care team.  Nicole Cella, RPh Clinical Pharmacist  Please utilize Amion for appropriate phone number to reach the unit pharmacist ([place])  08/23/2022 7:32 AM

## 2022-08-23 NOTE — Plan of Care (Signed)
  Problem: RH Swallowing Goal: LTG Patient will consume least restrictive diet using compensatory strategies with assistance (SLP) Description: LTG:  Patient will consume least restrictive diet using compensatory strategies with assistance (SLP) Outcome: Completed/Met   Problem: RH Swallowing Goal: LTG Patient will participate in dysphagia therapy to increase swallow function with assistance (SLP) Description: LTG:  Patient will participate in dysphagia therapy to increase swallow function with assistance (SLP) Outcome: Completed/Met

## 2022-08-23 NOTE — Progress Notes (Addendum)
PROGRESS NOTE   Subjective/Complaints: Ready for d/c today. He has no more emesis Tolerated HD yesterday Seen by nephrology yesterday  ROS:   Pt denies SOB, abd pain, CP,  + cough- improved, +emesis- resolved   Objective:   DG Chest 2 View  Result Date: 08/22/2022 CLINICAL DATA:  Cough, CHF, renal dysfunction EXAM: CHEST - 2 VIEW COMPARISON:  Previous studies including the examination of 08/16/2022 FINDINGS: Transverse diameter of heart is increased. Central pulmonary vessels are slightly less prominent. There are no signs of alveolar pulmonary edema or new focal infiltrates. There is no pleural effusion or pneumothorax. Tip of dialysis catheter is seen in the region of right atrium. IMPRESSION: Cardiomegaly. There is decrease in pulmonary vascular congestion. There are no signs of pulmonary edema or new focal infiltrates. Electronically Signed   By: Elmer Picker M.D.   On: 08/22/2022 10:09   Recent Labs    08/22/22 0544  WBC 9.4  HGB 10.9*  HCT 34.6*  PLT 299    Recent Labs    08/22/22 0544  NA 135  K 3.8  CL 92*  CO2 22  GLUCOSE 141*  BUN 55*  CREATININE 7.08*  CALCIUM 8.5*     Intake/Output Summary (Last 24 hours) at 08/23/2022 0939 Last data filed at 08/23/2022 0725 Gross per 24 hour  Intake 240 ml  Output 1500 ml  Net -1260 ml        Physical Exam: Vital Signs Blood pressure 100/71, pulse 83, temperature 97.8 F (36.6 C), temperature source Oral, resp. rate 18, height 3' 11.24" (1.2 m), weight 74.5 kg, SpO2 98 %. Gen: no distress, normal appearing, BMI 52.64 HEENT: oral mucosa pink and moist, NCAT Cardio: Reg rate Pulmonary: CTA B/L; no W/R/R- good air movement, +cough GI: soft, NT, ND, (+)BS Psychiatric: appropriate- a little flat, wandering slightly, but got back on topic easily Neurological: alert- STM a little decreased/noted Skin: wound vac removed, amputated fingers- Healed R AKA and  wrapped with ACE wrap L AKA- which is new Fistula L upper arm Neuro: Alert and oriented to person and place.  Knows December but does not know year or day.  Follows commands.  Cranial nerves II through XII intact. Sensation to light touch intact in all 4 extremities Strength 5 out of 5 bilateral shoulder abduction, elbow flexion and extension Able to lift bilateral lower extremities Musculoskeletal: Patient with multiple finger amputations. He has amputations of right second, third and fifth digits at MCP and fourth digit PIP.  He has amputations of the left second and fourth digits.  Left third and fifth digits with flexion contractures.  Right AKA is well-healed.  Left AKA with wound VAC dressing in place draining serosanguineous fluid.  Appropriately tender  No joint swelling noted. Small amount of drainage present  Left sided lean [place] gown MinA Right IJ TDC Right upper extremity IV   Assessment/Plan: 1. Functional deficits which require 3+ hours per day of interdisciplinary therapy in a comprehensive inpatient rehab setting. Physiatrist is providing close team supervision and 24 hour management of active medical problems listed below. Physiatrist and rehab team continue to assess barriers to discharge/monitor patient progress toward functional and medical  goals  Care Tool:  Bathing    Body parts bathed by patient: Right arm, Left arm, Chest, Abdomen   Body parts bathed by helper: Front perineal area, Buttocks, Right upper leg, Left upper leg Body parts n/a: Left lower leg, Right lower leg (bilateral AKAs)   Bathing assist Assist Level: Moderate Assistance - Patient 50 - 74%     Upper Body Dressing/Undressing Upper body dressing   What is the patient wearing?: Pull over shirt    Upper body assist Assist Level: Minimal Assistance - Patient > 75%    Lower Body Dressing/Undressing Lower body dressing      What is the patient wearing?: Pants, Incontinence brief      Lower body assist Assist for lower body dressing: Maximal Assistance - Patient 25 - 49%     Toileting Toileting Toileting Activity did not occur (Clothing management and hygiene only): N/A (no void or bm)  Toileting assist Assist for toileting: Total Assistance - Patient < 25%     Transfers Chair/bed transfer  Transfers assist  Chair/bed transfer activity did not occur: Safety/medical concerns  Chair/bed transfer assist level: 2 Helpers (AP/PA transfer)     Locomotion Ambulation   Ambulation assist   Ambulation activity did not occur: Safety/medical concerns (bilateral AKA, pain, decreased balance)          Walk 10 feet activity   Assist  Walk 10 feet activity did not occur: Safety/medical concerns (bilateral AKA, pain, decreased balance)        Walk 50 feet activity   Assist Walk 50 feet with 2 turns activity did not occur: Safety/medical concerns (bilateral AKA, pain, decreased balance)         Walk 150 feet activity   Assist Walk 150 feet activity did not occur: Safety/medical concerns (bilateral AKA, pain, decreased balance)         Walk 10 feet on uneven surface  activity   Assist Walk 10 feet on uneven surfaces activity did not occur: Safety/medical concerns (bilateral AKA, pain, decreased balance)         Wheelchair     Assist Is the patient using a wheelchair?: Yes Type of Wheelchair: Power Wheelchair activity did not occur: Safety/medical concerns (multiple digit amputations, bilateral AKA)  Wheelchair assist level: Supervision/Verbal cueing Max wheelchair distance: 258f    Wheelchair 50 feet with 2 turns activity    Assist    Wheelchair 50 feet with 2 turns activity did not occur: Safety/medical concerns (multiple digit amputations, bilateral AKA)   Assist Level: Supervision/Verbal cueing   Wheelchair 150 feet activity     Assist  Wheelchair 150 feet activity did not occur: Safety/medical concerns (multiple  digit amputations, bilateral AKA)   Assist Level: Supervision/Verbal cueing   Blood pressure 100/71, pulse 83, temperature 97.8 F (36.6 C), temperature source Oral, resp. rate 18, height 3' 11.24" (1.2 m), weight 74.5 kg, SpO2 98 %.  Medical Problem List and Plan: 1. Functional deficits secondary to left AKA 08/05/2022 due to gangrenous changes.  Status post wound VAC.  History of right TKA 01/08/2022             -patient may shower             -ELOS/Goals: 12-14 days , PT/OT min assist            d/c home  May benefit for a couple more days of family ed.  2. Impaired mobility: order power wheelchair, will need to teach family members safe transfers,  requires this due to his bilateral AKA and multiple digit amputations.  -DVT/anticoagulation:  Pharmaceutical: continue Heparin             -antiplatelet therapy: Aspirin 81 mg daily and Plavix 75 mg daily 3. Pain Management: continue Neurontin 100 mg 3 times daily, decrease oxycodone to BID as needed. Add vitamin C 4. Anxiety: Xanax d/ced given hypotension, discussed with patient 5. Neuropsych/cognition: This patient is capable of making decisions on his own behalf. 6. Residual limb incision: contacted vascular given increased purulent drainage.  7. Fluids/Electrolytes/Nutrition: Routine in and outs with follow-up chemistries 8.  End-stage renal disease.  Hemodialysis as directed per Nephrology. TTS schedule. Will inform nephrology that son would like to speak with them 63.  Acute on chronic anemia. Last HGB improved to 10.7, monitor outpatient 10.  Diabetes mellitus.  Hemoglobin A1c 6.3.  Tradjenta 5 mg daily/SSI. CBGs well controlled  12/31- CBGs controlled- con't regimen 11.  Chronic orthostasis.  ProAmatine 10 mg 3 times daily.  Monitor with increased mobility. Monitor fluid intake. 12.  Constipation.  Linzess daily and MiraLAX as needed. LBM 12/26  12/30- LBM yesterday   12/31- BM this AM- small 13.  GERD.  Protonix 14.  Chronic  combined CHF.  Ejection fraction 45%.  Monitor for any signs of fluid overload. Daily weights  d/w renal- will do HD with albumin Tuesday, discussed plan to take greater volume off to prevent fluid overload.  15. Hyperkalemia. Resolved. Continue HD per nephrology. 16. Acute transaminitis and cholelithiasis. monitor LFTs. Resolved, monitor outpateint 17. Hypotension: d/c xanax, advised to only give additional midodrine if symptomatic.  18. Pruritus: hydrocortisone d/c given elevated CBGs.  19. Loose stool: d/c nepro, messaged nursing to see if stools are still loose.  20. Cough: CXR ordered and shows decrease in pulmonary congestion, continue HD, Continue SLP outpatient. Discussed importance of repositioning before eating.  21. L AKA cellulitis: Ancef started. Notified ortho of continued drainage. Appreciate follow-up by Dr. Sharol Given.  22. Emesis: zofran given, resolved.       LOS: 10 days A FACE TO FACE EVALUATION WAS PERFORMED  Izora Ribas 08/23/2022, 9:39 AM

## 2022-08-23 NOTE — Progress Notes (Signed)
Inpatient Rehabilitation Care Coordinator Discharge Note   Patient Details  Name: Edward [name] MRN: [id number] Date of Birth: August 04, 1971   Discharge location: Home  Length of Stay: 10 Days  Discharge activity level: Mod  Home/community participation: son and caregivers  Patient response MM:NOTRRN Literacy - How often do you need to have someone help you when you read instructions, pamphlets, or other written material from your doctor or pharmacy?: Always  Patient response HA:FBXUXY Isolation - How often do you feel lonely or isolated from those around you?: Rarely  Services provided included: SW, Neuropsych, Pharmacy, TR, CM, RN, SLP, OT, PT, RD, MD  Financial Services:  Financial Services Utilized: [place] offered to/list presented to: patient  Follow-up services arranged:  [place]: [facility]         Patient response to transportation need: Is the patient able to respond to transportation needs?: Yes In the past 12 months, has lack of transportation kept you from medical appointments or from getting medications?: No In the past 12 months, has lack of transportation kept you from meetings, work, or from getting things needed for daily living?: No    Comments (or additional information):  Patient/Family verbalized understanding of follow-up arrangements:  Yes  Individual responsible for coordination of the follow-up plan: Jamal  Confirmed correct DME delivered: [name] 08/23/2022    [name]

## 2022-08-23 NOTE — Progress Notes (Addendum)
Patient ID: Edward [name], male   DOB: 12-17-1970, 52 y.o.   MRN: [id number]  SW met with patient and son. Transport set. Pt and son waiting on loaner WC to arrive to the home. PT informed. No additional questions or concerns.

## 2022-08-23 NOTE — Plan of Care (Signed)
  Problem: RH Balance Goal: LTG: Patient will maintain dynamic sitting balance (OT) Description: LTG:  Patient will maintain dynamic sitting balance with assistance during activities of daily living (OT) Outcome: Not Met (add Reason) Flowsheets (Taken 08/23/2022 1553) LTG: Pt will maintain dynamic sitting balance during ADLs with: (not met due to poor trunk control, endurance/strength/balance deficits) --   Problem: RH Dressing Goal: LTG Patient will perform lower body dressing w/assist (OT) Description: LTG: Patient will perform lower body dressing with assist, with/without cues in positioning using equipment (OT) Outcome: Not Met (add Reason) Flowsheets (Taken 08/23/2022 1553) LTG: Pt will perform lower body dressing with assistance level of: (not met due to poor trunk control, endurance/strength/balance deficits and limited digits on BUE affecting his grasping abilities) --   Problem: RH Toileting Goal: LTG Patient will perform toileting task (3/3 steps) with assistance level (OT) Description: LTG: Patient will perform toileting task (3/3 steps) with assistance level (OT)  Outcome: Not Met (add Reason) Flowsheets (Taken 08/23/2022 1553) LTG: Pt will perform toileting task (3/3 steps) with assistance level: (not met due to poor trunk control, endurance/strength/balance deficits and limited digits on BUE affecting his grasping abilities) --   Problem: RH Bathing Goal: LTG Patient will bathe all body parts with assist levels (OT) Description: LTG: Patient will bathe all body parts with assist levels (OT) Outcome: Completed/Met   Problem: RH Dressing Goal: LTG Patient will perform upper body dressing (OT) Description: LTG Patient will perform upper body dressing with assist, with/without cues (OT). Outcome: Completed/Met

## 2022-08-23 NOTE — Progress Notes (Signed)
Contacted [facility] to advise clinic that pt will d/c to home today and should resume at clinic tomorrow.  Melven Sartorius Renal Navigator [phone]

## 2022-08-23 NOTE — Progress Notes (Signed)
Son changed dressing over AKA with verbal and physical cues. Verbalizes understanding. IV removed and TOC medications on the way

## 2022-08-23 NOTE — Progress Notes (Addendum)
Patient ID: Edward [name], male   DOB: 05/22/1971, 52 y.o.   MRN: [id number]  Patient ready for d/c today. Patient d/c home with [facility] transport. HH established with [facility]. [facility] Application Submitted.Son coming to bring clothing then sw will arrange transport.  No additional questions or concerns from patient.

## 2022-08-24 ENCOUNTER — Telehealth: Payer: Self-pay | Admitting: Nephrology

## 2022-08-24 DIAGNOSIS — I5042 Chronic combined systolic (congestive) and diastolic (congestive) heart failure: Secondary | ICD-10-CM | POA: Diagnosis not present

## 2022-08-24 DIAGNOSIS — I70262 Atherosclerosis of native arteries of extremities with gangrene, left leg: Secondary | ICD-10-CM | POA: Diagnosis not present

## 2022-08-24 DIAGNOSIS — E1152 Type 2 diabetes mellitus with diabetic peripheral angiopathy with gangrene: Secondary | ICD-10-CM | POA: Diagnosis not present

## 2022-08-24 DIAGNOSIS — I132 Hypertensive heart and chronic kidney disease with heart failure and with stage 5 chronic kidney disease, or end stage renal disease: Secondary | ICD-10-CM | POA: Diagnosis not present

## 2022-08-24 DIAGNOSIS — Z89612 Acquired absence of left leg above knee: Secondary | ICD-10-CM | POA: Diagnosis not present

## 2022-08-24 DIAGNOSIS — Z4781 Encounter for orthopedic aftercare following surgical amputation: Secondary | ICD-10-CM | POA: Diagnosis not present

## 2022-08-24 NOTE — Telephone Encounter (Signed)
Transition of care contact from inpatient facility  Date of discharge: 08/23/22 Date of contact: 08/24/22 Method: Phone Spoke to: Patient's son Fannie Knee   Patient contacted to discuss transition of care from recent inpatient hospitalization. Patient was admitted to [facility] from 08/13/22-08/23/22 with discharge diagnosis of left above the knee amputation.   Medication changes were reviewed.  Edward [name] was not able to attend his scheduled dialysis today. His son reports that [facility] transportation did not show up this morning. He called them but appears their admin is not available on weekends. SW note from 08/23/22  states [facility] application was submitted. The family is not able to transport him to dialysis themselves. We have rescheduled his dialysis appointment for Monday at 11:45. He will follow up with [facility] Monday am. We will also send message to dialysis SW to assist. He understands to be careful with fluids and diet this weekend and to call 911 if Edward [name] has an emergency.   Thomos Lemons Shawnetta Lein PA-C 08/24/2022,12:23 PM

## 2022-08-26 ENCOUNTER — Telehealth: Payer: Self-pay | Admitting: Orthopedic Surgery

## 2022-08-26 NOTE — Telephone Encounter (Signed)
Amy (PT) called from [employer] called requesting verbal orders. Needed home health pt for 1wk 1, 2wk 1, and 1wk 1. Please call Amy back at [phone]. If unable to answer please leave detailed message on secure line.

## 2022-08-26 NOTE — Telephone Encounter (Signed)
This pt did d/c home from the hospital on Friday 08/23/2022. Can you call and see if we can make an appt for him to come in and see Junie Panning for this Friday? Let me know what time that morning they would like to come and I can open a spot. Thanks!

## 2022-08-26 NOTE — Progress Notes (Addendum)
Patient ID: Edward [name], male   DOB: 07/31/1971, 52 y.o.   MRN: [id number]  1/8: Sw informed that patient missed HD over weekend due to transportation. SW reached out to patient and will follow up with [facility] to potentially arrange transport. Patient will be pre-certified today. Application resubmitted to [employer]. Transportation resources provided.  7:10 PM:  SW received update from son that he has not been contacted about transportation from [employer]. Sw informed son to reach out to transportation services provided to determine cost to potentially arrange transportation until [facility] is arranged. Informed son that SW would follow up with [facility] in AM in case they do not agree for cost of transportation.  1/9: Sw Reached out to [employer], Training and development officer. Sw will wait for FU.

## 2022-08-26 NOTE — Telephone Encounter (Signed)
Lm on VM of VO approval.

## 2022-08-27 ENCOUNTER — Telehealth: Payer: Self-pay

## 2022-08-27 NOTE — Telephone Encounter (Signed)
Transitional Care call--son Jamal    Are you/is patient experiencing any problems since coming home? No Are there any questions regarding any aspect of care? No Are there any questions regarding medications administration/dosing? No Are meds being taken as prescribed? YesPatient should review meds with caller to confirm Have there been any falls? No Has Home Health been to the house and/or have they contacted you? Yes If not, have you tried to contact them? Can we help you contact them? Are bowels and bladder emptying properly? Yes Are there any unexpected incontinence issues? No If applicable, is patient following bowel/bladder programs? Any fevers, problems with breathing, unexpected pain? No Are there any skin problems or new areas of breakdown? Leaking from leg from wound Has the patient/family member arranged specialty MD follow up (ie cardiology/neurology/renal/surgical/etc)? Given the numbers to contact  Can we help arrange? Does the patient need any other services or support that we can help arrange? Requesting a HHAide Are caregivers following through as expected in assisting the patient? Yes Has the patient quit smoking, drinking alcohol, or using drugs as recommended? Yes  Appointment time 11:00 arrive time 10:40 with Zella Ball then Dr. Ranell Patrick [address] [phone]

## 2022-08-28 DIAGNOSIS — N2581 Secondary hyperparathyroidism of renal origin: Secondary | ICD-10-CM | POA: Diagnosis not present

## 2022-08-28 DIAGNOSIS — Z89612 Acquired absence of left leg above knee: Secondary | ICD-10-CM | POA: Diagnosis not present

## 2022-08-28 DIAGNOSIS — I5042 Chronic combined systolic (congestive) and diastolic (congestive) heart failure: Secondary | ICD-10-CM | POA: Diagnosis not present

## 2022-08-28 DIAGNOSIS — L299 Pruritus, unspecified: Secondary | ICD-10-CM | POA: Diagnosis not present

## 2022-08-28 DIAGNOSIS — Z4781 Encounter for orthopedic aftercare following surgical amputation: Secondary | ICD-10-CM | POA: Diagnosis not present

## 2022-08-28 DIAGNOSIS — T8249XD Other complication of vascular dialysis catheter, subsequent encounter: Secondary | ICD-10-CM | POA: Diagnosis not present

## 2022-08-28 DIAGNOSIS — I132 Hypertensive heart and chronic kidney disease with heart failure and with stage 5 chronic kidney disease, or end stage renal disease: Secondary | ICD-10-CM | POA: Diagnosis not present

## 2022-08-28 DIAGNOSIS — E1152 Type 2 diabetes mellitus with diabetic peripheral angiopathy with gangrene: Secondary | ICD-10-CM | POA: Diagnosis not present

## 2022-08-28 DIAGNOSIS — N186 End stage renal disease: Secondary | ICD-10-CM | POA: Diagnosis not present

## 2022-08-28 DIAGNOSIS — E1129 Type 2 diabetes mellitus with other diabetic kidney complication: Secondary | ICD-10-CM | POA: Diagnosis not present

## 2022-08-28 DIAGNOSIS — Z992 Dependence on renal dialysis: Secondary | ICD-10-CM | POA: Diagnosis not present

## 2022-08-28 DIAGNOSIS — I70262 Atherosclerosis of native arteries of extremities with gangrene, left leg: Secondary | ICD-10-CM | POA: Diagnosis not present

## 2022-08-28 DIAGNOSIS — R52 Pain, unspecified: Secondary | ICD-10-CM | POA: Diagnosis not present

## 2022-08-28 DIAGNOSIS — D631 Anemia in chronic kidney disease: Secondary | ICD-10-CM | POA: Diagnosis not present

## 2022-08-28 DIAGNOSIS — D689 Coagulation defect, unspecified: Secondary | ICD-10-CM | POA: Diagnosis not present

## 2022-08-28 NOTE — Telephone Encounter (Signed)
Pt's son called back and states that they can not bring the pt in until Friday an appt was sch with Erin at 9:30

## 2022-08-28 NOTE — Telephone Encounter (Signed)
Edward [name] with [facility] called [phone] and advised that the pt's incision is opening and that the measurements are 6 x 2.5 x 0.4 cm with tunneling 4.5 advised that she covered with dressing and advised pt to call for appt. I called and sw [name]. Advised we have been calling pt multiple times since hospital d/c for appt. Asked if he could come for visit today. Declined advised that they are having difficulty getting pt out of the house and he missed 2 dialysis appts because of this. He went for the first time today and is worn out said it was a"horrible experience" to get him out of the house. HHN had advised could put a piece of ply wood down over the steps and let him use his motorized wheelchair. Pt's son said that he will speak with his brother and see if they can bring him in tomorrow to see Dr. Sharol Given advised he will call me back after he discusses with family. Will hold this message and await return phone call.

## 2022-08-29 ENCOUNTER — Telehealth: Payer: Self-pay | Admitting: Family

## 2022-08-29 DIAGNOSIS — Z4781 Encounter for orthopedic aftercare following surgical amputation: Secondary | ICD-10-CM | POA: Diagnosis not present

## 2022-08-29 DIAGNOSIS — E1152 Type 2 diabetes mellitus with diabetic peripheral angiopathy with gangrene: Secondary | ICD-10-CM | POA: Diagnosis not present

## 2022-08-29 DIAGNOSIS — Z89612 Acquired absence of left leg above knee: Secondary | ICD-10-CM | POA: Diagnosis not present

## 2022-08-29 DIAGNOSIS — I132 Hypertensive heart and chronic kidney disease with heart failure and with stage 5 chronic kidney disease, or end stage renal disease: Secondary | ICD-10-CM | POA: Diagnosis not present

## 2022-08-29 DIAGNOSIS — I5042 Chronic combined systolic (congestive) and diastolic (congestive) heart failure: Secondary | ICD-10-CM | POA: Diagnosis not present

## 2022-08-29 DIAGNOSIS — I70262 Atherosclerosis of native arteries of extremities with gangrene, left leg: Secondary | ICD-10-CM | POA: Diagnosis not present

## 2022-08-29 NOTE — Telephone Encounter (Signed)
Edward [name] [id number]   needs a plan of care for home health OT  1x week for 3 weeks  for adl threptic exeresis  and activi and sitting blance

## 2022-08-30 ENCOUNTER — Encounter: Payer: Self-pay | Admitting: Family

## 2022-08-30 ENCOUNTER — Ambulatory Visit (INDEPENDENT_AMBULATORY_CARE_PROVIDER_SITE_OTHER): Payer: 59 | Admitting: Family

## 2022-08-30 DIAGNOSIS — Z89612 Acquired absence of left leg above knee: Secondary | ICD-10-CM

## 2022-08-30 DIAGNOSIS — T8781 Dehiscence of amputation stump: Secondary | ICD-10-CM

## 2022-08-30 MED ORDER — DOXYCYCLINE HYCLATE 100 MG PO TABS: 100.0000 mg | ORAL_TABLET | Freq: Two times a day (BID) | ORAL | 0 refills | Status: DC

## 2022-08-30 NOTE — Progress Notes (Signed)
Post-Op Visit Note   Patient: Edward [name]           Date of Birth: [birth year]           MRN: [id number] Visit Date: 08/30/2022 PCP: [name], MD  Chief Complaint:  Chief Complaint  Patient presents with   Right Leg - Routine Post Op    08/07/22 left AKA    HPI:  HPI The patient is a 52 year old gentleman seen status post left above-knee amputation on December 20.  The patient is status post remote right above-knee amputation he complains of significant pain with his most recent amputation on the left this is unlike anything he experienced with his right.  Reports that his dialysis doctor has advised him to stop his Plavix  Does have home health assistance once a week with dressing changes today has a dry dressing no packing Ortho Exam On examination of the left residual limb there are 2 open areas laterally there is an area that is 6 cm in length which is open and there is fibrinous tissue serous drainage.  This unfortunately does probe 5 cm towards the midline.  He has a medial ulceration which is 1 cm in diameter which also probes 3 cm deep there is no purulence no erythema he has had having significant tenderness  Visit Diagnoses:  1. H/O above knee amputation, left ([place])   2. Dehiscence of amputation stump of left lower extremity (HCC)     Plan: Recommended revision left above-knee amputation.  Patient is quite resistant at this time very frustrated with his poor healing he would like to discuss this with Dr. Sharol Given himself we will place him on a course of doxycycline and discussed return precautions.  He will follow-up with Dr. Sharol Given next week  Follow-Up Instructions: No follow-ups on file.   Imaging: No results found.  Orders:  No orders of the defined types were placed in this encounter.  No orders of the defined types were placed in this encounter.    PMFS History: Patient Active Problem List   Diagnosis Date Noted   S/P bilateral above knee amputation  ([place]) 08/13/2022   Atherosclerosis of native arteries of extremities with gangrene, left leg ([place]) 08/06/2022   Diabetic foot ulcer ([place]) 08/05/2022   Pectoralis muscle strain 05/10/2022   Cervical lesion 05/10/2022   Hand abscess 56/31/4970   Acute metabolic encephalopathy    PAD (peripheral artery disease) ([place]) 01/07/2022   Osteomyelitis of finger of left hand ([place]) 10/04/2021   Junctional rhythm 10/04/2021   Acute cholecystitis 09/27/2020   Elevated LFTs 09/27/2020   TIA (transient ischemic attack) 09/27/2020   Nausea and vomiting    ESRD on hemodialysis ([place]) 06/22/2020   S/P coronary artery stent placement 06/22/2020   Encounter for removal of sutures 03/07/2020   Fluid overload, unspecified 12/29/2019   Hypertensive chronic kidney disease with stage 1 through stage 4 chronic kidney disease, or unspecified chronic kidney disease 11/30/2019   Pruritus, unspecified 11/30/2019   Secondary hyperparathyroidism of renal origin ([place]) 11/30/2019   Unspecified atrial fibrillation ([place]) 11/30/2019   Pain, unspecified 11/30/2019   Other specified arthritis, unspecified site 11/30/2019   Iron deficiency anemia, unspecified 11/30/2019   End stage renal disease ([place]) 11/30/2019   Encounter for screening for respiratory tuberculosis 11/30/2019   Diarrhea, unspecified 11/30/2019   Coagulation defect, unspecified ([place]) 11/30/2019   Atherosclerotic heart disease of native coronary artery without angina pectoris 11/30/2019   Acute renal failure (ARF) ([place]) 11/22/2019  Transaminitis 12/22/2018   Acute exacerbation of CHF (congestive heart failure) ([place]) 12/21/2018   Gait abnormality 09/11/2018   CAD S/P percutaneous coronary angioplasty 08/06/2018   Cardiomyopathy ([place]) 08/06/2018   PVD (peripheral vascular disease) ([place]) 08/06/2018   Shortness of breath    Chronic combined systolic and diastolic CHF (congestive heart failure) ([place])    History of NSTEMI 07/28/2018   Respiratory failure  with hypoxia ([place]) 07/28/2018   Acute heart failure ([place]) 07/28/2018   Hypertensive emergency 07/28/2018   S/P BKA (below knee amputation) unilateral, right ([place]) 07/15/2018   Hypoglycemia    Anxiety about health    Labile blood glucose    Essential hypertension    History of transmetatarsal amputation of left foot (HCC)    Hypoalbuminemia due to protein-calorie malnutrition ([place])    Poorly controlled type 2 diabetes mellitus with peripheral neuropathy (HCC)    Acute blood loss anemia    Anemia of chronic disease    Leukocytosis    Tobacco abuse    Hyperkalemia 06/20/2018   Diabetic polyneuropathy associated with type 2 diabetes mellitus (HCC)    Severe protein-calorie malnutrition ([place])    AKI (acute kidney injury) ([place]) 06/17/2018   Normocytic anemia 06/17/2018   Viral pharyngitis 01/14/2017   Cigarette smoker 01/14/2017   Avascular necrosis of hip, left ([place]) 10/11/2016   Status post left hip replacement 10/11/2016   S/P transmetatarsal amputation of foot, left ([place]) 07/25/2016   Balance problem 07/25/2016   Fournier's gangrene 08/16/2011   Acute respiratory failure ([place]) 08/16/2011   Septic shock(785.52) 08/16/2011   Diabetes mellitus with complication, with long-term current use of insulin ([place]) 08/16/2011   Past Medical History:  Diagnosis Date   Allergy    Anemia    getting Venofer with dialysis   Anxiety    self reported, sometimes-situational anxiety   Arthritis    bilateral hands/LEFT hip   CHF (congestive heart failure) ([place])    Chronic kidney disease    t th s dialysis- [place] ---ESRD   Coronary artery disease    Depression    self reported, sometimes-situational depression   Diabetes mellitus    type II- on meds   Dyspnea    sometimes- fluid   GERD (gastroesophageal reflux disease)    on meds   History of blood transfusion    Hypertension    no longer on medication since starting on Hemodialysis   Myocardial infarction ([place]) 2019   Peripheral  vascular disease ([place])    Sleep apnea    does not use CPAP    Family History  Problem Relation Age of Onset   Diabetes Other    Pancreatic cancer Mother 5   Colon cancer Neg Hx    Colon polyps Neg Hx    Esophageal cancer Neg Hx    Rectal cancer Neg Hx    Stomach cancer Neg Hx     Past Surgical History:  Procedure Laterality Date   A/V FISTULAGRAM Left 03/06/2020   Procedure: A/V FISTULAGRAM;  Surgeon: Waynetta Sandy, MD;  Location: [facility];  Service: Cardiovascular;  Laterality: Left;   ABDOMINAL AORTOGRAM N/A 01/07/2022   Procedure: ABDOMINAL AORTOGRAM;  Surgeon: Waynetta Sandy, MD;  Location: [facility];  Service: Cardiovascular;  Laterality: N/A;   ABDOMINAL AORTOGRAM W/LOWER EXTREMITY N/A 04/09/2021   Procedure: ABDOMINAL AORTOGRAM W/LOWER EXTREMITY;  Surgeon: Waynetta Sandy, MD;  Location: [facility];  Service: Cardiovascular;  Laterality: N/A;   AMPUTATION Left 05/10/2016  Procedure: Left Transmetatarsal Amputation;  Surgeon: [name], MD;  Location: [facility];  Service: Orthopedics;  Laterality: Left;   AMPUTATION Right 05/16/2018   Procedure: PARTIAL RAY AMPUTATION FIFTH TOE RIGHT FOOT;  Surgeon: [name], DPM;  Location: [facility];  Service: Podiatry;  Laterality: Right;   AMPUTATION Right 06/17/2018   Procedure: AMPUTATION 4th RAY Right Foot;  Surgeon: Trula Slade, DPM;  Location: [facility];  Service: Podiatry;  Laterality: Right;   AMPUTATION Right 06/19/2018   Procedure: RIGHT BELOW KNEE AMPUTATION;  Surgeon: [name], MD;  Location: [facility];  Service: Orthopedics;  Laterality: Right;   AMPUTATION Left 10/05/2021   Procedure: LEFT RING FINGER AMPUTATION;  Surgeon: Daryll Brod, MD;  Location: [facility];  Service: Orthopedics;  Laterality: Left;  45 MIN   AMPUTATION Left 11/15/2021   Procedure: LEFT INDEX FINGER AMPUTATION;  Surgeon: Leanora Cover, MD;  Location: [facility];  Service: Orthopedics;  Laterality:  Left;  60 MIN   AMPUTATION Right 01/08/2022   Procedure: AMPUTATION ABOVE KNEE;  Surgeon: Waynetta Sandy, MD;  Location: [facility];  Service: Vascular;  Laterality: Right;   AMPUTATION Bilateral 01/12/2022   Procedure: LEFT INDEX FINGER AMPUTATION, RIGHT INDEX, LONG AND SMALL DIGITS AMPUTATION;  Surgeon: Leanora Cover, MD;  Location: [facility];  Service: Orthopedics;  Laterality: Bilateral;   AMPUTATION Right 04/01/2022   Procedure: RIGHT INDEX FINGER AMPUTATION AT METACARPAL PHALANGEAL JOINT, INCISION AND DRAINAGE RIGHT HAND;  Surgeon: Leanora Cover, MD;  Location: [facility];  Service: Orthopedics;  Laterality: Right;  90 MIN   AMPUTATION Right 05/23/2022   Procedure: RIGHT INDEX FINGER RAY AMPUTATION; RIGHT LONG, RING, AND SMALL FINGER AMPUTATIONS;  Surgeon: Leanora Cover, MD;  Location: [facility];  Service: Orthopedics;  Laterality: Right;  90 MIN   AMPUTATION Left 08/07/2022   Procedure: LEFT ABOVE KNEE AMPUTATION;  Surgeon: [name], MD;  Location: [facility];  Service: Orthopedics;  Laterality: Left;   AORTIC ARCH ANGIOGRAPHY N/A 01/07/2022   Procedure: AORTIC ARCH ANGIOGRAPHY;  Surgeon: Waynetta Sandy, MD;  Location: [facility];  Service: Cardiovascular;  Laterality: N/A;   APPLICATION OF WOUND VAC Right 06/19/2018   Procedure: APPLICATION OF WOUND VAC;  Surgeon: [name], MD;  Location: [facility];  Service: Orthopedics;  Laterality: Right;   APPLICATION OF WOUND VAC Left 08/07/2022   Procedure: APPLICATION OF WOUND VAC;  Surgeon: [name], MD;  Location: [facility];  Service: Orthopedics;  Laterality: Left;   AV FISTULA PLACEMENT Left 11/25/2019   Procedure: LEFT ARM Brachiocephalic  ARTERIOVENOUS (AV) FISTULA CREATION;  Surgeon: Rosetta Posner, MD;  Location: [facility];  Service: Vascular;  Laterality: Left;   [place] Left 07/19/2020   Procedure: LEFT SECOND STAGE [place];  Surgeon: Waynetta Sandy, MD;  Location: [facility];   Service: Vascular;  Laterality: Left;   CIRCUMCISION  09/02/2011   Procedure: CIRCUMCISION ADULT;  Surgeon: Molli Hazard, MD;  Location: [facility];  Service: Urology;  Laterality: N/A;   CORONARY STENT INTERVENTION N/A 07/29/2018   Procedure: CORONARY STENT INTERVENTION;  Surgeon: Leonie Man, MD;  Location: [facility];  Service: Cardiovascular;  Laterality: N/A;   DEBRIDEMENT AND CLOSURE WOUND Left 11/15/2021   Procedure: LEFT HAND WOUND CLOSURE;  Surgeon: Leanora Cover, MD;  Location: [facility];  Service: Orthopedics;  Laterality: Left;   I & D EXTREMITY  09/02/2011   Procedure: IRRIGATION AND DEBRIDEMENT EXTREMITY;  Surgeon: Theodoro Kos, DO;  Location: [facility];  Service: Plastics;  Laterality: N/A;   INCISION AND DRAINAGE OF WOUND  08/12/2011   Procedure: IRRIGATION AND DEBRIDEMENT WOUND;  Surgeon: Zenovia Jarred, MD;  Location: [facility];  Service: General;;  perineum   INSERTION OF DIALYSIS CATHETER N/A 11/25/2019   Procedure: INSERTION OF Righ Internal Jugular DIALYSIS CATHETER;  Surgeon: Rosetta Posner, MD;  Location: [facility];  Service: Vascular;  Laterality: N/A;   [place]  08/12/2011   Procedure: IRRIGATION AND DEBRIDEMENT ABSCESS;  Surgeon: Molli Hazard, MD;  Location: [facility];  Service: Urology;  Laterality: N/A;  irrigation and debridment perineum     IRRIGATION AND DEBRIDEMENT ABSCESS  08/14/2011   Procedure: MINOR INCISION AND DRAINAGE OF ABSCESS;  Surgeon: Molli Hazard, MD;  Location: [facility];  Service: Urology;  Laterality: N/A;  Perineal Wound Debridement;Placement Bilateral Testicular Thigh Pouches   LEFT HEART CATH AND CORONARY ANGIOGRAPHY N/A 07/29/2018   Procedure: LEFT HEART CATH AND CORONARY ANGIOGRAPHY;  Surgeon: Leonie Man, MD;  Location: [facility];  Service: Cardiovascular;  Laterality: N/A;   LOWER EXTREMITY ANGIOGRAPHY Bilateral 01/07/2022   Procedure: Lower  Extremity Angiography;  Surgeon: Waynetta Sandy, MD;  Location: [facility];  Service: Cardiovascular;  Laterality: Bilateral;   PERIPHERAL VASCULAR ATHERECTOMY Left 04/09/2021   Procedure: PERIPHERAL VASCULAR ATHERECTOMY;  Surgeon: Waynetta Sandy, MD;  Location: [facility];  Service: Cardiovascular;  Laterality: Left;  TP trunk and peroneal arteries   PERIPHERAL VASCULAR BALLOON ANGIOPLASTY Left 04/09/2021   Procedure: PERIPHERAL VASCULAR BALLOON ANGIOPLASTY;  Surgeon: Waynetta Sandy, MD;  Location: [facility];  Service: Cardiovascular;  Laterality: Left;  Popliteal   REVISON OF ARTERIOVENOUS FISTULA Left 05/24/2020   Procedure: LEFT ARM ARTERIOVENOUS FISTULA  CONVERSION TO BASILIC VEIN FISTULA;  Surgeon: Waynetta Sandy, MD;  Location: [facility];  Service: Vascular;  Laterality: Left;   TOTAL HIP ARTHROPLASTY Left 10/11/2016   Procedure: LEFT TOTAL HIP ARTHROPLASTY ANTERIOR APPROACH;  Surgeon: Mcarthur Rossetti, MD;  [facility];  Service: Orthopedics;  Laterality: Left;   UPPER EXTREMITY ANGIOGRAPHY Bilateral 01/07/2022   Procedure: Upper Extremity Angiography;  Surgeon: Waynetta Sandy, MD;  Location: [facility];  Service: Cardiovascular;  Laterality: Bilateral;   Social History   Occupational History   Occupation: Disabled  Tobacco Use   Smoking status: Former    Packs/day: 0.25    Years: 24.00    Total pack years: 6.00    Types: Cigarettes    Quit date: 11/19/2019    Years since quitting: 2.7   Smokeless tobacco: Never  Vaping Use   Vaping Use: Never used  Substance and Sexual Activity   Alcohol use: Not Currently    Alcohol/week: 0.0 standard drinks of alcohol    Comment: quit 2018   Drug use: No   Sexual activity: Not Currently    Birth control/protection: Condom

## 2022-08-30 NOTE — Telephone Encounter (Signed)
Called and sw [place] advised verbal ok for OT orders as below. Will call with any questions or concerns.

## 2022-08-31 DIAGNOSIS — R52 Pain, unspecified: Secondary | ICD-10-CM | POA: Diagnosis not present

## 2022-08-31 DIAGNOSIS — Z992 Dependence on renal dialysis: Secondary | ICD-10-CM | POA: Diagnosis not present

## 2022-08-31 DIAGNOSIS — D631 Anemia in chronic kidney disease: Secondary | ICD-10-CM | POA: Diagnosis not present

## 2022-08-31 DIAGNOSIS — N186 End stage renal disease: Secondary | ICD-10-CM | POA: Diagnosis not present

## 2022-08-31 DIAGNOSIS — T8249XD Other complication of vascular dialysis catheter, subsequent encounter: Secondary | ICD-10-CM | POA: Diagnosis not present

## 2022-08-31 DIAGNOSIS — D689 Coagulation defect, unspecified: Secondary | ICD-10-CM | POA: Diagnosis not present

## 2022-08-31 DIAGNOSIS — L299 Pruritus, unspecified: Secondary | ICD-10-CM | POA: Diagnosis not present

## 2022-08-31 DIAGNOSIS — N2581 Secondary hyperparathyroidism of renal origin: Secondary | ICD-10-CM | POA: Diagnosis not present

## 2022-08-31 DIAGNOSIS — E1129 Type 2 diabetes mellitus with other diabetic kidney complication: Secondary | ICD-10-CM | POA: Diagnosis not present

## 2022-09-03 DIAGNOSIS — E1129 Type 2 diabetes mellitus with other diabetic kidney complication: Secondary | ICD-10-CM | POA: Diagnosis not present

## 2022-09-03 DIAGNOSIS — Z992 Dependence on renal dialysis: Secondary | ICD-10-CM | POA: Diagnosis not present

## 2022-09-03 DIAGNOSIS — D631 Anemia in chronic kidney disease: Secondary | ICD-10-CM | POA: Diagnosis not present

## 2022-09-03 DIAGNOSIS — L299 Pruritus, unspecified: Secondary | ICD-10-CM | POA: Diagnosis not present

## 2022-09-03 DIAGNOSIS — N186 End stage renal disease: Secondary | ICD-10-CM | POA: Diagnosis not present

## 2022-09-03 DIAGNOSIS — T8249XD Other complication of vascular dialysis catheter, subsequent encounter: Secondary | ICD-10-CM | POA: Diagnosis not present

## 2022-09-03 DIAGNOSIS — R52 Pain, unspecified: Secondary | ICD-10-CM | POA: Diagnosis not present

## 2022-09-03 DIAGNOSIS — D689 Coagulation defect, unspecified: Secondary | ICD-10-CM | POA: Diagnosis not present

## 2022-09-03 DIAGNOSIS — N2581 Secondary hyperparathyroidism of renal origin: Secondary | ICD-10-CM | POA: Diagnosis not present

## 2022-09-05 ENCOUNTER — Encounter: Payer: 59 | Admitting: Orthopedic Surgery

## 2022-09-05 ENCOUNTER — Encounter: Payer: 59 | Attending: Registered Nurse | Admitting: Registered Nurse

## 2022-09-05 ENCOUNTER — Ambulatory Visit: Payer: 59 | Admitting: Orthopedic Surgery

## 2022-09-05 ENCOUNTER — Ambulatory Visit (INDEPENDENT_AMBULATORY_CARE_PROVIDER_SITE_OTHER): Payer: 59 | Admitting: Orthopedic Surgery

## 2022-09-05 ENCOUNTER — Telehealth: Payer: Self-pay | Admitting: Orthopedic Surgery

## 2022-09-05 DIAGNOSIS — Z89612 Acquired absence of left leg above knee: Secondary | ICD-10-CM

## 2022-09-05 NOTE — Telephone Encounter (Signed)
Patient son called in stating his father will need to be rescheduled he is post op and he is stating that the patient is not doing so well, patient [facility] transportation failed him again by not putting him on the route son stated they have been having trouble with them since they started. I gave the first available with Erin on 01/24 but he stated patient needed to be seen sooner please advise

## 2022-09-06 ENCOUNTER — Other Ambulatory Visit: Payer: Self-pay | Admitting: Orthopedic Surgery

## 2022-09-06 ENCOUNTER — Telehealth: Payer: Self-pay | Admitting: Orthopedic Surgery

## 2022-09-06 ENCOUNTER — Telehealth: Payer: Self-pay

## 2022-09-06 DIAGNOSIS — E1152 Type 2 diabetes mellitus with diabetic peripheral angiopathy with gangrene: Secondary | ICD-10-CM | POA: Diagnosis not present

## 2022-09-06 DIAGNOSIS — Z4781 Encounter for orthopedic aftercare following surgical amputation: Secondary | ICD-10-CM | POA: Diagnosis not present

## 2022-09-06 DIAGNOSIS — I70262 Atherosclerosis of native arteries of extremities with gangrene, left leg: Secondary | ICD-10-CM | POA: Diagnosis not present

## 2022-09-06 DIAGNOSIS — I132 Hypertensive heart and chronic kidney disease with heart failure and with stage 5 chronic kidney disease, or end stage renal disease: Secondary | ICD-10-CM | POA: Diagnosis not present

## 2022-09-06 DIAGNOSIS — Z89612 Acquired absence of left leg above knee: Secondary | ICD-10-CM | POA: Diagnosis not present

## 2022-09-06 DIAGNOSIS — I5042 Chronic combined systolic (congestive) and diastolic (congestive) heart failure: Secondary | ICD-10-CM | POA: Diagnosis not present

## 2022-09-06 MED ORDER — SILVER SULFADIAZINE 1 % EX CREA: 1.0000 | TOPICAL_CREAM | Freq: Every day | CUTANEOUS | 3 refills | Status: DC

## 2022-09-06 NOTE — Telephone Encounter (Signed)
Verbal orders given

## 2022-09-06 NOTE — Telephone Encounter (Signed)
Received call from [place] with [facility] needing orders for wound care nursing. [place] said need to show patient's son how to dress and care for patient's wound. The number to contact [facility] is [phone]

## 2022-09-06 NOTE — Telephone Encounter (Signed)
HHPT called stating patient had a sore/wound on his right buttock she is going to put silver/calcium alginate on it and a foam pad. Also, asking about the fingers, I told her Sharol Given did not do that-Kuzma did. She is going to give them a call about this

## 2022-09-06 NOTE — Telephone Encounter (Signed)
Called and advised pt. And hh nurse. Both stated understanding. Hh nurse will go to home on Monday to make sure pt is doing it correctly.

## 2022-09-06 NOTE — Telephone Encounter (Signed)
Monique (PT) from [facility] called requesting verbal orders to extend home health pt for 2 wk 2, and 1wk 1. [place] phone number is [phone]. Please leave message on secure line if unable to answer.

## 2022-09-06 NOTE — Telephone Encounter (Signed)
[facility] on [address]- please call in the Rx Silvadene for patient

## 2022-09-06 NOTE — Telephone Encounter (Signed)
Patient aware rx was sent to his pharmacy

## 2022-09-07 DIAGNOSIS — D631 Anemia in chronic kidney disease: Secondary | ICD-10-CM | POA: Diagnosis not present

## 2022-09-07 DIAGNOSIS — N2581 Secondary hyperparathyroidism of renal origin: Secondary | ICD-10-CM | POA: Diagnosis not present

## 2022-09-07 DIAGNOSIS — D689 Coagulation defect, unspecified: Secondary | ICD-10-CM | POA: Diagnosis not present

## 2022-09-07 DIAGNOSIS — E1129 Type 2 diabetes mellitus with other diabetic kidney complication: Secondary | ICD-10-CM | POA: Diagnosis not present

## 2022-09-07 DIAGNOSIS — Z992 Dependence on renal dialysis: Secondary | ICD-10-CM | POA: Diagnosis not present

## 2022-09-07 DIAGNOSIS — R52 Pain, unspecified: Secondary | ICD-10-CM | POA: Diagnosis not present

## 2022-09-07 DIAGNOSIS — N186 End stage renal disease: Secondary | ICD-10-CM | POA: Diagnosis not present

## 2022-09-07 DIAGNOSIS — L299 Pruritus, unspecified: Secondary | ICD-10-CM | POA: Diagnosis not present

## 2022-09-07 DIAGNOSIS — T8249XD Other complication of vascular dialysis catheter, subsequent encounter: Secondary | ICD-10-CM | POA: Diagnosis not present

## 2022-09-09 DIAGNOSIS — I132 Hypertensive heart and chronic kidney disease with heart failure and with stage 5 chronic kidney disease, or end stage renal disease: Secondary | ICD-10-CM | POA: Diagnosis not present

## 2022-09-09 DIAGNOSIS — I5042 Chronic combined systolic (congestive) and diastolic (congestive) heart failure: Secondary | ICD-10-CM | POA: Diagnosis not present

## 2022-09-09 DIAGNOSIS — Z4781 Encounter for orthopedic aftercare following surgical amputation: Secondary | ICD-10-CM | POA: Diagnosis not present

## 2022-09-09 DIAGNOSIS — Z89612 Acquired absence of left leg above knee: Secondary | ICD-10-CM | POA: Diagnosis not present

## 2022-09-09 DIAGNOSIS — I70262 Atherosclerosis of native arteries of extremities with gangrene, left leg: Secondary | ICD-10-CM | POA: Diagnosis not present

## 2022-09-09 DIAGNOSIS — E1152 Type 2 diabetes mellitus with diabetic peripheral angiopathy with gangrene: Secondary | ICD-10-CM | POA: Diagnosis not present

## 2022-09-10 ENCOUNTER — Encounter: Payer: Self-pay | Admitting: Orthopedic Surgery

## 2022-09-10 NOTE — Progress Notes (Signed)
Office Visit Note   Patient: Edward [name]           Date of Birth: 07-14-1971           MRN: [id number] Visit Date: 09/05/2022              Requested by: Edward [name], Edward [name] [address] Edward [name],  Edward [name] [address] PCP: Edward Beard, MD  Chief Complaint  Patient presents with   Left Leg - Follow-up      HPI: Patient is 4 weeks status post left above-the-knee amputation.  Patient states he does have home health nursing visiting.  Assessment & Plan: Visit Diagnoses:  1. H/O above knee amputation, left ([place])     Plan: Prescription is called in for Silvadene to apply to the wounds with Dial soap cleansing.  Follow-Up Instructions: Return in about 2 weeks (around 09/19/2022).   Ortho Exam  Patient is alert, oriented, no adenopathy, well-dressed, normal affect, normal respiratory effort. Examination patient has 2 small areas of wound dehiscence that are 1 x 3 cm with healthy granulation tissue.  There is no cellulitis odor or drainage or tunneling.  Imaging: No results found. No images are attached to the encounter.  Labs: Lab Results  Component Value Date   HGBA1C 6.3 (H) 05/10/2022   HGBA1C 6.5 (H) 01/07/2022   HGBA1C 8.6 (H) 10/05/2021   ESRSEDRATE 128 (H) 06/15/2018   ESRSEDRATE 47 (H) 05/07/2016   CRP 21.3 (H) 01/12/2022   CRP 27.8 (H) 01/11/2022   CRP 25.5 (H) 01/10/2022   REPTSTATUS 08/10/2022 FINAL 08/05/2022   REPTSTATUS 08/10/2022 FINAL 08/05/2022   GRAMSTAIN  05/23/2022    RARE WBC PRESENT,BOTH PMN AND MONONUCLEAR RARE GRAM POSITIVE COCCI IN PAIRS    CULT  08/05/2022    NO GROWTH 5 DAYS Performed at [facility], [place] [address] [place], [place] [address]    CULT  08/05/2022    NO GROWTH 5 DAYS Performed at [facility] [address] [place], [place] [address]    [place] CLOACAE 05/23/2022   LABORGA STAPHYLOCOCCUS EPIDERMIDIS 05/23/2022     Lab Results  Component Value Date   ALBUMIN 2.5 (L) 08/22/2022    ALBUMIN 2.5 (L) 08/19/2022   ALBUMIN 2.5 (L) 08/13/2022    Lab Results  Component Value Date   MG 2.8 (H) 08/05/2022   MG 1.9 04/05/2022   MG 2.1 04/04/2022   No results found for: "VD25OH"  No results found for: "PREALBUMIN"    Latest Ref Rng & Units 08/22/2022    5:44 AM 08/19/2022    6:55 AM 08/13/2022    9:19 AM  CBC EXTENDED  WBC 4.0 - 10.5 K/uL 9.4  10.8  13.2   RBC 4.22 - 5.81 MIL/uL 3.76  3.70  3.25   Hemoglobin 13.0 - 17.0 g/dL 10.9  10.7  9.5   HCT 39.0 - 52.0 % 34.6  34.3  31.2   Platelets 150 - 400 K/uL 299  333  217   NEUT# 1.7 - 7.7 K/uL  8.8    Lymph# 0.7 - 4.0 K/uL  0.8       There is no height or weight on file to calculate BMI.  Orders:  No orders of the defined types were placed in this encounter.  No orders of the defined types were placed in this encounter.    Procedures: No procedures performed  Clinical Data: No additional findings.  ROS:  All other systems  negative, except as noted in the HPI. Review of Systems  Objective: Vital Signs: There were no vitals taken for this visit.  Specialty Comments:  No specialty comments available.  PMFS History: Patient Active Problem List   Diagnosis Date Noted   S/P bilateral above knee amputation ([place]) 08/13/2022   Atherosclerosis of native arteries of extremities with gangrene, left leg ([place]) 08/06/2022   Diabetic foot ulcer ([place]) 08/05/2022   Pectoralis muscle strain 05/10/2022   Cervical lesion 05/10/2022   Hand abscess 32/20/2542   Acute metabolic encephalopathy    PAD (peripheral artery disease) ([place]) 01/07/2022   Osteomyelitis of finger of left hand ([place]) 10/04/2021   Junctional rhythm 10/04/2021   Acute cholecystitis 09/27/2020   Elevated LFTs 09/27/2020   TIA (transient ischemic attack) 09/27/2020   Nausea and vomiting    ESRD on hemodialysis ([place]) 06/22/2020   S/P coronary artery stent placement 06/22/2020   Encounter for removal of sutures 03/07/2020   Fluid overload,  unspecified 12/29/2019   Hypertensive chronic kidney disease with stage 1 through stage 4 chronic kidney disease, or unspecified chronic kidney disease 11/30/2019   Pruritus, unspecified 11/30/2019   Secondary hyperparathyroidism of renal origin ([place]) 11/30/2019   Unspecified atrial fibrillation ([place]) 11/30/2019   Pain, unspecified 11/30/2019   Other specified arthritis, unspecified site 11/30/2019   Iron deficiency anemia, unspecified 11/30/2019   End stage renal disease ([place]) 11/30/2019   Encounter for screening for respiratory tuberculosis 11/30/2019   Diarrhea, unspecified 11/30/2019   Coagulation defect, unspecified ([place]) 11/30/2019   Atherosclerotic heart disease of native coronary artery without angina pectoris 11/30/2019   Acute renal failure (ARF) ([place]) 11/22/2019   Transaminitis 12/22/2018   Acute exacerbation of CHF (congestive heart failure) ([place]) 12/21/2018   Gait abnormality 09/11/2018   CAD S/P percutaneous coronary angioplasty 08/06/2018   Cardiomyopathy ([place]) 08/06/2018   PVD (peripheral vascular disease) ([place]) 08/06/2018   Shortness of breath    Chronic combined systolic and diastolic CHF (congestive heart failure) ([place])    History of NSTEMI 07/28/2018   Respiratory failure with hypoxia ([place]) 07/28/2018   Acute heart failure ([place]) 07/28/2018   Hypertensive emergency 07/28/2018   S/P BKA (below knee amputation) unilateral, right ([place]) 07/15/2018   Hypoglycemia    Anxiety about health    Labile blood glucose    Essential hypertension    History of transmetatarsal amputation of left foot ([place])    Hypoalbuminemia due to protein-calorie malnutrition ([place])    Poorly controlled type 2 diabetes mellitus with peripheral neuropathy ([place])    Acute blood loss anemia    Anemia of chronic disease    Leukocytosis    Tobacco abuse    Hyperkalemia 06/20/2018   Diabetic polyneuropathy associated with type 2 diabetes mellitus ([place])    Severe protein-calorie malnutrition ([place])     AKI (acute kidney injury) ([place]) 06/17/2018   Normocytic anemia 06/17/2018   Viral pharyngitis 01/14/2017   Cigarette smoker 01/14/2017   Avascular necrosis of hip, left ([place]) 10/11/2016   Status post left hip replacement 10/11/2016   S/P transmetatarsal amputation of foot, left ([place]) 07/25/2016   Balance problem 07/25/2016   Fournier's gangrene 08/16/2011   Acute respiratory failure ([place]) 08/16/2011   Septic shock(785.52) 08/16/2011   Diabetes mellitus with complication, with long-term current use of insulin ([place]) 08/16/2011   Past Medical History:  Diagnosis Date   Allergy    Anemia    getting Venofer with dialysis   Anxiety    self reported, sometimes-situational anxiety  Arthritis    bilateral hands/LEFT hip   CHF (congestive heart failure) (HCC)    Chronic kidney disease    t th s dialysis- [place] ---ESRD   Coronary artery disease    Depression    self reported, sometimes-situational depression   Diabetes mellitus    type II- on meds   Dyspnea    sometimes- fluid   GERD (gastroesophageal reflux disease)    on meds   History of blood transfusion    Hypertension    no longer on medication since starting on Hemodialysis   Myocardial infarction [facility]) 2019   Peripheral vascular disease ([place])    Sleep apnea    does not use CPAP    Family History  Problem Relation Age of Onset   Diabetes Other    Pancreatic cancer Mother 33   Colon cancer Neg Hx    Colon polyps Neg Hx    Esophageal cancer Neg Hx    Rectal cancer Neg Hx    Stomach cancer Neg Hx     Past Surgical History:  Procedure Laterality Date   A/V FISTULAGRAM Left 03/06/2020   Procedure: A/V FISTULAGRAM;  Surgeon: Waynetta Sandy, MD;  Location: [facility];  Service: Cardiovascular;  Laterality: Left;   ABDOMINAL AORTOGRAM N/A 01/07/2022   Procedure: ABDOMINAL AORTOGRAM;  Surgeon: Waynetta Sandy, MD;  Location: [facility];  Service: Cardiovascular;  Laterality: N/A;    ABDOMINAL AORTOGRAM W/LOWER EXTREMITY N/A 04/09/2021   Procedure: ABDOMINAL AORTOGRAM W/LOWER EXTREMITY;  Surgeon: Waynetta Sandy, MD;  Location: [facility];  Service: Cardiovascular;  Laterality: N/A;   AMPUTATION Left 05/10/2016   Procedure: Left Transmetatarsal Amputation;  Surgeon: [name], MD;  Location: [facility];  Service: Orthopedics;  Laterality: Left;   AMPUTATION Right 05/16/2018   Procedure: PARTIAL RAY AMPUTATION FIFTH TOE RIGHT FOOT;  Surgeon: [name], DPM;  Location: [facility];  Service: Podiatry;  Laterality: Right;   AMPUTATION Right 06/17/2018   Procedure: AMPUTATION 4th RAY Right Foot;  Surgeon: Trula Slade, DPM;  Location: [facility];  Service: Podiatry;  Laterality: Right;   AMPUTATION Right 06/19/2018   Procedure: RIGHT BELOW KNEE AMPUTATION;  Surgeon: [name], MD;  Location: [facility];  Service: Orthopedics;  Laterality: Right;   AMPUTATION Left 10/05/2021   Procedure: LEFT RING FINGER AMPUTATION;  Surgeon: Daryll Brod, MD;  Location: [facility];  Service: Orthopedics;  Laterality: Left;  45 MIN   AMPUTATION Left 11/15/2021   Procedure: LEFT INDEX FINGER AMPUTATION;  Surgeon: Leanora Cover, MD;  Location: [facility];  Service: Orthopedics;  Laterality: Left;  60 MIN   AMPUTATION Right 01/08/2022   Procedure: AMPUTATION ABOVE KNEE;  Surgeon: Waynetta Sandy, MD;  Location: [facility];  Service: Vascular;  Laterality: Right;   AMPUTATION Bilateral 01/12/2022   Procedure: LEFT INDEX FINGER AMPUTATION, RIGHT INDEX, LONG AND SMALL DIGITS AMPUTATION;  Surgeon: Leanora Cover, MD;  Location: [facility];  Service: Orthopedics;  Laterality: Bilateral;   AMPUTATION Right 04/01/2022   Procedure: RIGHT INDEX FINGER AMPUTATION AT METACARPAL PHALANGEAL JOINT, INCISION AND DRAINAGE RIGHT HAND;  Surgeon: Leanora Cover, MD;  Location: [facility];  Service: Orthopedics;  Laterality: Right;  90 MIN   AMPUTATION Right 05/23/2022   Procedure: RIGHT INDEX FINGER RAY AMPUTATION; RIGHT  LONG, RING, AND SMALL FINGER AMPUTATIONS;  Surgeon: Leanora Cover, MD;  Location: [facility];  Service: Orthopedics;  Laterality: Right;  90 MIN   AMPUTATION Left 08/07/2022  Procedure: LEFT ABOVE KNEE AMPUTATION;  Surgeon: [name], MD;  Location: [facility];  Service: Orthopedics;  Laterality: Left;   AORTIC ARCH ANGIOGRAPHY N/A 01/07/2022   Procedure: AORTIC ARCH ANGIOGRAPHY;  Surgeon: Waynetta Sandy, MD;  Location: [facility];  Service: Cardiovascular;  Laterality: N/A;   APPLICATION OF WOUND VAC Right 06/19/2018   Procedure: APPLICATION OF WOUND VAC;  Surgeon: [name], MD;  Location: [facility];  Service: Orthopedics;  Laterality: Right;   APPLICATION OF WOUND VAC Left 08/07/2022   Procedure: APPLICATION OF WOUND VAC;  Surgeon: [name], MD;  Location: [facility];  Service: Orthopedics;  Laterality: Left;   AV FISTULA PLACEMENT Left 11/25/2019   Procedure: LEFT ARM Brachiocephalic  ARTERIOVENOUS (AV) FISTULA CREATION;  Surgeon: Rosetta Posner, MD;  Location: [facility];  Service: Vascular;  Laterality: Left;   [place] Left 07/19/2020   Procedure: LEFT SECOND STAGE [place];  Surgeon: Waynetta Sandy, MD;  Location: [facility];  Service: Vascular;  Laterality: Left;   CIRCUMCISION  09/02/2011   Procedure: CIRCUMCISION ADULT;  Surgeon: Molli Hazard, MD;  Location: [facility];  Service: Urology;  Laterality: N/A;   CORONARY STENT INTERVENTION N/A 07/29/2018   Procedure: CORONARY STENT INTERVENTION;  Surgeon: Leonie Man, MD;  Location: [facility];  Service: Cardiovascular;  Laterality: N/A;   DEBRIDEMENT AND CLOSURE WOUND Left 11/15/2021   Procedure: LEFT HAND WOUND CLOSURE;  Surgeon: Leanora Cover, MD;  Location: [facility];  Service: Orthopedics;  Laterality: Left;   I & D EXTREMITY  09/02/2011   Procedure: IRRIGATION AND DEBRIDEMENT EXTREMITY;  Surgeon: Theodoro Kos, DO;  Location: [facility];  Service: Plastics;  Laterality: N/A;   INCISION AND DRAINAGE OF WOUND  08/12/2011   Procedure: IRRIGATION AND DEBRIDEMENT WOUND;  Surgeon: Zenovia Jarred, MD;  Location: [facility];  Service: General;;  perineum   INSERTION OF DIALYSIS CATHETER N/A 11/25/2019   Procedure: INSERTION OF Righ Internal Jugular DIALYSIS CATHETER;  Surgeon: Rosetta Posner, MD;  Location: [facility];  Service: Vascular;  Laterality: N/A;   [place]  08/12/2011   Procedure: IRRIGATION AND DEBRIDEMENT ABSCESS;  Surgeon: Molli Hazard, MD;  Location: [facility];  Service: Urology;  Laterality: N/A;  irrigation and debridment perineum     IRRIGATION AND DEBRIDEMENT ABSCESS  08/14/2011   Procedure: MINOR INCISION AND DRAINAGE OF ABSCESS;  Surgeon: Molli Hazard, MD;  Location: [facility];  Service: Urology;  Laterality: N/A;  Perineal Wound Debridement;Placement Bilateral Testicular Thigh Pouches   LEFT HEART CATH AND CORONARY ANGIOGRAPHY N/A 07/29/2018   Procedure: LEFT HEART CATH AND CORONARY ANGIOGRAPHY;  Surgeon: Leonie Man, MD;  Location: [facility];  Service: Cardiovascular;  Laterality: N/A;   LOWER EXTREMITY ANGIOGRAPHY Bilateral 01/07/2022   Procedure: Lower Extremity Angiography;  Surgeon: Waynetta Sandy, MD;  Location: [facility];  Service: Cardiovascular;  Laterality: Bilateral;   PERIPHERAL VASCULAR ATHERECTOMY Left 04/09/2021   Procedure: PERIPHERAL VASCULAR ATHERECTOMY;  Surgeon: Waynetta Sandy, MD;  Location: [facility];  Service: Cardiovascular;  Laterality: Left;  TP trunk and peroneal arteries   PERIPHERAL VASCULAR BALLOON ANGIOPLASTY Left 04/09/2021   Procedure: PERIPHERAL VASCULAR BALLOON ANGIOPLASTY;  Surgeon: Waynetta Sandy, MD;  Location: [facility];  Service: Cardiovascular;  Laterality: Left;  Popliteal   REVISON OF ARTERIOVENOUS FISTULA Left 05/24/2020   Procedure: LEFT ARM ARTERIOVENOUS  FISTULA   CONVERSION TO BASILIC VEIN FISTULA;  Surgeon: Waynetta Sandy, MD;  Location: [facility];  Service: Vascular;  Laterality: Left;   TOTAL HIP ARTHROPLASTY Left 10/11/2016   Procedure: LEFT TOTAL HIP ARTHROPLASTY ANTERIOR APPROACH;  Surgeon: Mcarthur Rossetti, MD;  Location[facility];  Service: Orthopedics;  Laterality: Left;   UPPER EXTREMITY ANGIOGRAPHY Bilateral 01/07/2022   Procedure: Upper Extremity Angiography;  Surgeon: Waynetta Sandy, MD;  Location: [facility];  Service: Cardiovascular;  Laterality: Bilateral;   Social History   Occupational History   Occupation: Disabled  Tobacco Use   Smoking status: Former    Packs/day: 0.25    Years: 24.00    Total pack years: 6.00    Types: Cigarettes    Quit date: 11/19/2019    Years since quitting: 2.8   Smokeless tobacco: Never  Vaping Use   Vaping Use: Never used  Substance and Sexual Activity   Alcohol use: Not Currently    Alcohol/week: 0.0 standard drinks of alcohol    Comment: quit 2018   Drug use: No   Sexual activity: Not Currently    Birth control/protection: Condom

## 2022-09-11 DIAGNOSIS — Z4781 Encounter for orthopedic aftercare following surgical amputation: Secondary | ICD-10-CM | POA: Diagnosis not present

## 2022-09-11 DIAGNOSIS — E1152 Type 2 diabetes mellitus with diabetic peripheral angiopathy with gangrene: Secondary | ICD-10-CM | POA: Diagnosis not present

## 2022-09-11 DIAGNOSIS — Z89612 Acquired absence of left leg above knee: Secondary | ICD-10-CM | POA: Diagnosis not present

## 2022-09-11 DIAGNOSIS — I132 Hypertensive heart and chronic kidney disease with heart failure and with stage 5 chronic kidney disease, or end stage renal disease: Secondary | ICD-10-CM | POA: Diagnosis not present

## 2022-09-11 DIAGNOSIS — I5042 Chronic combined systolic (congestive) and diastolic (congestive) heart failure: Secondary | ICD-10-CM | POA: Diagnosis not present

## 2022-09-11 DIAGNOSIS — I70262 Atherosclerosis of native arteries of extremities with gangrene, left leg: Secondary | ICD-10-CM | POA: Diagnosis not present

## 2022-09-12 DIAGNOSIS — L299 Pruritus, unspecified: Secondary | ICD-10-CM | POA: Diagnosis not present

## 2022-09-12 DIAGNOSIS — Z992 Dependence on renal dialysis: Secondary | ICD-10-CM | POA: Diagnosis not present

## 2022-09-12 DIAGNOSIS — N2581 Secondary hyperparathyroidism of renal origin: Secondary | ICD-10-CM | POA: Diagnosis not present

## 2022-09-12 DIAGNOSIS — D689 Coagulation defect, unspecified: Secondary | ICD-10-CM | POA: Diagnosis not present

## 2022-09-12 DIAGNOSIS — D631 Anemia in chronic kidney disease: Secondary | ICD-10-CM | POA: Diagnosis not present

## 2022-09-12 DIAGNOSIS — T8249XD Other complication of vascular dialysis catheter, subsequent encounter: Secondary | ICD-10-CM | POA: Diagnosis not present

## 2022-09-12 DIAGNOSIS — R52 Pain, unspecified: Secondary | ICD-10-CM | POA: Diagnosis not present

## 2022-09-12 DIAGNOSIS — E1129 Type 2 diabetes mellitus with other diabetic kidney complication: Secondary | ICD-10-CM | POA: Diagnosis not present

## 2022-09-12 DIAGNOSIS — N186 End stage renal disease: Secondary | ICD-10-CM | POA: Diagnosis not present

## 2022-09-13 DIAGNOSIS — I132 Hypertensive heart and chronic kidney disease with heart failure and with stage 5 chronic kidney disease, or end stage renal disease: Secondary | ICD-10-CM | POA: Diagnosis not present

## 2022-09-13 DIAGNOSIS — E1152 Type 2 diabetes mellitus with diabetic peripheral angiopathy with gangrene: Secondary | ICD-10-CM | POA: Diagnosis not present

## 2022-09-13 DIAGNOSIS — Z89612 Acquired absence of left leg above knee: Secondary | ICD-10-CM | POA: Diagnosis not present

## 2022-09-13 DIAGNOSIS — I70262 Atherosclerosis of native arteries of extremities with gangrene, left leg: Secondary | ICD-10-CM | POA: Diagnosis not present

## 2022-09-13 DIAGNOSIS — I5042 Chronic combined systolic (congestive) and diastolic (congestive) heart failure: Secondary | ICD-10-CM | POA: Diagnosis not present

## 2022-09-13 DIAGNOSIS — Z4781 Encounter for orthopedic aftercare following surgical amputation: Secondary | ICD-10-CM | POA: Diagnosis not present

## 2022-09-14 ENCOUNTER — Encounter: Payer: Self-pay | Admitting: Vascular Surgery

## 2022-09-16 DIAGNOSIS — I5042 Chronic combined systolic (congestive) and diastolic (congestive) heart failure: Secondary | ICD-10-CM | POA: Diagnosis not present

## 2022-09-16 DIAGNOSIS — Z89612 Acquired absence of left leg above knee: Secondary | ICD-10-CM | POA: Diagnosis not present

## 2022-09-16 DIAGNOSIS — E1152 Type 2 diabetes mellitus with diabetic peripheral angiopathy with gangrene: Secondary | ICD-10-CM | POA: Diagnosis not present

## 2022-09-16 DIAGNOSIS — I132 Hypertensive heart and chronic kidney disease with heart failure and with stage 5 chronic kidney disease, or end stage renal disease: Secondary | ICD-10-CM | POA: Diagnosis not present

## 2022-09-16 DIAGNOSIS — Z4781 Encounter for orthopedic aftercare following surgical amputation: Secondary | ICD-10-CM | POA: Diagnosis not present

## 2022-09-16 DIAGNOSIS — I70262 Atherosclerosis of native arteries of extremities with gangrene, left leg: Secondary | ICD-10-CM | POA: Diagnosis not present

## 2022-09-17 DIAGNOSIS — D631 Anemia in chronic kidney disease: Secondary | ICD-10-CM | POA: Diagnosis not present

## 2022-09-17 DIAGNOSIS — I5042 Chronic combined systolic (congestive) and diastolic (congestive) heart failure: Secondary | ICD-10-CM | POA: Diagnosis not present

## 2022-09-17 DIAGNOSIS — N186 End stage renal disease: Secondary | ICD-10-CM | POA: Diagnosis not present

## 2022-09-17 DIAGNOSIS — R52 Pain, unspecified: Secondary | ICD-10-CM | POA: Diagnosis not present

## 2022-09-17 DIAGNOSIS — D689 Coagulation defect, unspecified: Secondary | ICD-10-CM | POA: Diagnosis not present

## 2022-09-17 DIAGNOSIS — N2581 Secondary hyperparathyroidism of renal origin: Secondary | ICD-10-CM | POA: Diagnosis not present

## 2022-09-17 DIAGNOSIS — Z992 Dependence on renal dialysis: Secondary | ICD-10-CM | POA: Diagnosis not present

## 2022-09-17 DIAGNOSIS — E1129 Type 2 diabetes mellitus with other diabetic kidney complication: Secondary | ICD-10-CM | POA: Diagnosis not present

## 2022-09-17 DIAGNOSIS — L299 Pruritus, unspecified: Secondary | ICD-10-CM | POA: Diagnosis not present

## 2022-09-17 DIAGNOSIS — N184 Chronic kidney disease, stage 4 (severe): Secondary | ICD-10-CM | POA: Diagnosis not present

## 2022-09-17 DIAGNOSIS — I130 Hypertensive heart and chronic kidney disease with heart failure and stage 1 through stage 4 chronic kidney disease, or unspecified chronic kidney disease: Secondary | ICD-10-CM | POA: Diagnosis not present

## 2022-09-17 DIAGNOSIS — E1122 Type 2 diabetes mellitus with diabetic chronic kidney disease: Secondary | ICD-10-CM | POA: Diagnosis not present

## 2022-09-17 DIAGNOSIS — T8249XD Other complication of vascular dialysis catheter, subsequent encounter: Secondary | ICD-10-CM | POA: Diagnosis not present

## 2022-09-18 ENCOUNTER — Telehealth: Payer: Self-pay

## 2022-09-18 DIAGNOSIS — Z4781 Encounter for orthopedic aftercare following surgical amputation: Secondary | ICD-10-CM | POA: Diagnosis not present

## 2022-09-18 DIAGNOSIS — E1129 Type 2 diabetes mellitus with other diabetic kidney complication: Secondary | ICD-10-CM | POA: Diagnosis not present

## 2022-09-18 DIAGNOSIS — Z992 Dependence on renal dialysis: Secondary | ICD-10-CM | POA: Diagnosis not present

## 2022-09-18 DIAGNOSIS — I5042 Chronic combined systolic (congestive) and diastolic (congestive) heart failure: Secondary | ICD-10-CM | POA: Diagnosis not present

## 2022-09-18 DIAGNOSIS — E1152 Type 2 diabetes mellitus with diabetic peripheral angiopathy with gangrene: Secondary | ICD-10-CM | POA: Diagnosis not present

## 2022-09-18 DIAGNOSIS — I132 Hypertensive heart and chronic kidney disease with heart failure and with stage 5 chronic kidney disease, or end stage renal disease: Secondary | ICD-10-CM | POA: Diagnosis not present

## 2022-09-18 DIAGNOSIS — N186 End stage renal disease: Secondary | ICD-10-CM | POA: Diagnosis not present

## 2022-09-18 DIAGNOSIS — I70262 Atherosclerosis of native arteries of extremities with gangrene, left leg: Secondary | ICD-10-CM | POA: Diagnosis not present

## 2022-09-18 DIAGNOSIS — I998 Other disorder of circulatory system: Secondary | ICD-10-CM | POA: Diagnosis not present

## 2022-09-18 DIAGNOSIS — Z89612 Acquired absence of left leg above knee: Secondary | ICD-10-CM | POA: Diagnosis not present

## 2022-09-18 DIAGNOSIS — I96 Gangrene, not elsewhere classified: Secondary | ICD-10-CM | POA: Diagnosis not present

## 2022-09-18 NOTE — Telephone Encounter (Signed)
Monique from [facility] called. Patient is set to be discharged at next visit due to unable to participate due to increased pain. [phone]

## 2022-09-19 ENCOUNTER — Encounter (HOSPITAL_COMMUNITY): Payer: Self-pay | Admitting: Orthopedic Surgery

## 2022-09-19 ENCOUNTER — Encounter: Payer: Self-pay | Admitting: Orthopedic Surgery

## 2022-09-19 ENCOUNTER — Ambulatory Visit (INDEPENDENT_AMBULATORY_CARE_PROVIDER_SITE_OTHER): Payer: 59 | Admitting: Orthopedic Surgery

## 2022-09-19 DIAGNOSIS — Z992 Dependence on renal dialysis: Secondary | ICD-10-CM | POA: Diagnosis not present

## 2022-09-19 DIAGNOSIS — R52 Pain, unspecified: Secondary | ICD-10-CM | POA: Diagnosis not present

## 2022-09-19 DIAGNOSIS — L299 Pruritus, unspecified: Secondary | ICD-10-CM | POA: Diagnosis not present

## 2022-09-19 DIAGNOSIS — N2581 Secondary hyperparathyroidism of renal origin: Secondary | ICD-10-CM | POA: Diagnosis not present

## 2022-09-19 DIAGNOSIS — I5042 Chronic combined systolic (congestive) and diastolic (congestive) heart failure: Secondary | ICD-10-CM | POA: Diagnosis not present

## 2022-09-19 DIAGNOSIS — E1129 Type 2 diabetes mellitus with other diabetic kidney complication: Secondary | ICD-10-CM | POA: Diagnosis not present

## 2022-09-19 DIAGNOSIS — R197 Diarrhea, unspecified: Secondary | ICD-10-CM | POA: Diagnosis not present

## 2022-09-19 DIAGNOSIS — Z89612 Acquired absence of left leg above knee: Secondary | ICD-10-CM

## 2022-09-19 DIAGNOSIS — I132 Hypertensive heart and chronic kidney disease with heart failure and with stage 5 chronic kidney disease, or end stage renal disease: Secondary | ICD-10-CM | POA: Diagnosis not present

## 2022-09-19 DIAGNOSIS — E1152 Type 2 diabetes mellitus with diabetic peripheral angiopathy with gangrene: Secondary | ICD-10-CM | POA: Diagnosis not present

## 2022-09-19 DIAGNOSIS — T8249XD Other complication of vascular dialysis catheter, subsequent encounter: Secondary | ICD-10-CM | POA: Diagnosis not present

## 2022-09-19 DIAGNOSIS — I70262 Atherosclerosis of native arteries of extremities with gangrene, left leg: Secondary | ICD-10-CM | POA: Diagnosis not present

## 2022-09-19 DIAGNOSIS — N186 End stage renal disease: Secondary | ICD-10-CM | POA: Diagnosis not present

## 2022-09-19 DIAGNOSIS — D631 Anemia in chronic kidney disease: Secondary | ICD-10-CM | POA: Diagnosis not present

## 2022-09-19 DIAGNOSIS — D689 Coagulation defect, unspecified: Secondary | ICD-10-CM | POA: Diagnosis not present

## 2022-09-19 DIAGNOSIS — T8781 Dehiscence of amputation stump: Secondary | ICD-10-CM

## 2022-09-19 DIAGNOSIS — Z4781 Encounter for orthopedic aftercare following surgical amputation: Secondary | ICD-10-CM | POA: Diagnosis not present

## 2022-09-19 NOTE — Progress Notes (Signed)
Office Visit Note   Patient: Edward [name]           Date of Birth: Oct 29, 1970           MRN: [id number] Visit Date: 09/19/2022              Requested by: [name], MD [address] [place],  [place] [address] PCP: [name], MD  Chief Complaint  Patient presents with   Left Leg - Routine Post Op    08/07/2022 left AKA       HPI: Patient is a 52 year old gentleman who is 6 weeks status post left above-knee amputation.  Assessment & Plan: Visit Diagnoses:  1. H/O above knee amputation, left ([place])   2. Dehiscence of amputation stump of left lower extremity (HCC)     Plan: With the complete dehiscence of the residual limb will plan for revision above-knee amputation the left plan for dialysis Saturday and discharge to home when stable.  Follow-Up Instructions: Return in about 2 weeks (around 10/03/2022).   Ortho Exam  Patient is alert, oriented, no adenopathy, well-dressed, normal affect, normal respiratory effort. Examination patient has complete dehiscence of the above-knee amputation.  There is necrotic draining tissue.  There is no cellulitis no gangrenous changes to the skin.  Patient states he was on an antibiotic but does not know what happened to the pills.  Imaging: No results found.   Labs: Lab Results  Component Value Date   HGBA1C 6.3 (H) 05/10/2022   HGBA1C 6.5 (H) 01/07/2022   HGBA1C 8.6 (H) 10/05/2021   ESRSEDRATE 128 (H) 06/15/2018   ESRSEDRATE 47 (H) 05/07/2016   CRP 21.3 (H) 01/12/2022   CRP 27.8 (H) 01/11/2022   CRP 25.5 (H) 01/10/2022   REPTSTATUS 08/10/2022 FINAL 08/05/2022   REPTSTATUS 08/10/2022 FINAL 08/05/2022   GRAMSTAIN  05/23/2022    RARE WBC PRESENT,BOTH PMN AND MONONUCLEAR RARE GRAM POSITIVE COCCI IN PAIRS    CULT  08/05/2022    NO GROWTH 5 DAYS Performed at [facility], [place] [address] [place], [place] [address]    CULT  08/05/2022    NO GROWTH 5 DAYS Performed at [place] [address]  [place],  [address]    [place] CLOACAE 05/23/2022   LABORGA STAPHYLOCOCCUS EPIDERMIDIS 05/23/2022     Lab Results  Component Value Date   ALBUMIN 2.5 (L) 08/22/2022   ALBUMIN 2.5 (L) 08/19/2022   ALBUMIN 2.5 (L) 08/13/2022    Lab Results  Component Value Date   MG 2.8 (H) 08/05/2022   MG 1.9 04/05/2022   MG 2.1 04/04/2022   No results found for: "VD25OH"  No results found for: "PREALBUMIN"    Latest Ref Rng & Units 08/22/2022    5:44 AM 08/19/2022    6:55 AM 08/13/2022    9:19 AM  CBC EXTENDED  WBC 4.0 - 10.5 K/uL 9.4  10.8  13.2   RBC 4.22 - 5.81 MIL/uL 3.76  3.70  3.25   Hemoglobin 13.0 - 17.0 g/dL 10.9  10.7  9.5   HCT 39.0 - 52.0 % 34.6  34.3  31.2   Platelets 150 - 400 K/uL 299  333  217   NEUT# 1.7 - 7.7 K/uL  8.8    Lymph# 0.7 - 4.0 K/uL  0.8       There is no height or weight on file to calculate BMI.  Orders:  No orders of the defined types were  placed in this encounter.  No orders of the defined types were placed in this encounter.    Procedures: No procedures performed  Clinical Data: No additional findings.  ROS:  All other systems negative, except as noted in the HPI. Review of Systems  Objective: Vital Signs: There were no vitals taken for this visit.  Specialty Comments:  No specialty comments available.  PMFS History: Patient Active Problem List   Diagnosis Date Noted   S/P bilateral above knee amputation ([place]) 08/13/2022   Atherosclerosis of native arteries of extremities with gangrene, left leg ([place]) 08/06/2022   Diabetic foot ulcer ([place]) 08/05/2022   Pectoralis muscle strain 05/10/2022   Cervical lesion 05/10/2022   Hand abscess 22/09/5425   Acute metabolic encephalopathy    PAD (peripheral artery disease) ([place]) 01/07/2022   Osteomyelitis of finger of left hand ([place]) 10/04/2021   Junctional rhythm 10/04/2021   Acute cholecystitis 09/27/2020   Elevated LFTs 09/27/2020   TIA (transient ischemic attack)  09/27/2020   Nausea and vomiting    ESRD on hemodialysis ([place]) 06/22/2020   S/P coronary artery stent placement 06/22/2020   Encounter for removal of sutures 03/07/2020   Fluid overload, unspecified 12/29/2019   Hypertensive chronic kidney disease with stage 1 through stage 4 chronic kidney disease, or unspecified chronic kidney disease 11/30/2019   Pruritus, unspecified 11/30/2019   Secondary hyperparathyroidism of renal origin ([place]) 11/30/2019   Unspecified atrial fibrillation ([place]) 11/30/2019   Pain, unspecified 11/30/2019   Other specified arthritis, unspecified site 11/30/2019   Iron deficiency anemia, unspecified 11/30/2019   End stage renal disease ([place]) 11/30/2019   Encounter for screening for respiratory tuberculosis 11/30/2019   Diarrhea, unspecified 11/30/2019   Coagulation defect, unspecified ([place]) 11/30/2019   Atherosclerotic heart disease of native coronary artery without angina pectoris 11/30/2019   Acute renal failure (ARF) ([place]) 11/22/2019   Transaminitis 12/22/2018   Acute exacerbation of CHF (congestive heart failure) ([place]) 12/21/2018   Gait abnormality 09/11/2018   CAD S/P percutaneous coronary angioplasty 08/06/2018   Cardiomyopathy ([place]) 08/06/2018   PVD (peripheral vascular disease) ([place]) 08/06/2018   Shortness of breath    Chronic combined systolic and diastolic CHF (congestive heart failure) ([place])    History of NSTEMI 07/28/2018   Respiratory failure with hypoxia ([place]) 07/28/2018   Acute heart failure ([place]) 07/28/2018   Hypertensive emergency 07/28/2018   S/P BKA (below knee amputation) unilateral, right ([place]) 07/15/2018   Hypoglycemia    Anxiety about health    Labile blood glucose    Essential hypertension    History of transmetatarsal amputation of left foot ([place])    Hypoalbuminemia due to protein-calorie malnutrition ([place])    Poorly controlled type 2 diabetes mellitus with peripheral neuropathy ([place])    Acute blood loss anemia    Anemia of chronic  disease    Leukocytosis    Tobacco abuse    Hyperkalemia 06/20/2018   Diabetic polyneuropathy associated with type 2 diabetes mellitus ([place])    Severe protein-calorie malnutrition ([place])    AKI (acute kidney injury) ([place]) 06/17/2018   Normocytic anemia 06/17/2018   Viral pharyngitis 01/14/2017   Cigarette smoker 01/14/2017   Avascular necrosis of hip, left ([place]) 10/11/2016   Status post left hip replacement 10/11/2016   S/P transmetatarsal amputation of foot, left ([place]) 07/25/2016   Balance problem 07/25/2016   Fournier's gangrene 08/16/2011   Acute respiratory failure ([place]) 08/16/2011   Septic shock(785.52) 08/16/2011   Diabetes mellitus with complication, with long-term current use of insulin ([place])  08/16/2011   Past Medical History:  Diagnosis Date   Allergy    Anemia    getting Venofer with dialysis   Anxiety    self reported, sometimes-situational anxiety   Arthritis    bilateral hands/LEFT hip   CHF (congestive heart failure) (HCC)    Chronic kidney disease    t th s dialysis- [place] ---ESRD   Coronary artery disease    Depression    self reported, sometimes-situational depression   Diabetes mellitus    type II- on meds   Dyspnea    sometimes- fluid   GERD (gastroesophageal reflux disease)    on meds   History of blood transfusion    Hypertension    no longer on medication since starting on Hemodialysis   Myocardial infarction ([place]) 2019   Peripheral vascular disease ([place])    Sleep apnea    does not use CPAP    Family History  Problem Relation Age of Onset   Diabetes Other    Pancreatic cancer Mother 70   Colon cancer Neg Hx    Colon polyps Neg Hx    Esophageal cancer Neg Hx    Rectal cancer Neg Hx    Stomach cancer Neg Hx     Past Surgical History:  Procedure Laterality Date   A/V FISTULAGRAM Left 03/06/2020   Procedure: A/V FISTULAGRAM;  Surgeon: Waynetta Sandy, MD;  Location: [facility];  Service: Cardiovascular;  Laterality: Left;    ABDOMINAL AORTOGRAM N/A 01/07/2022   Procedure: ABDOMINAL AORTOGRAM;  Surgeon: Waynetta Sandy, MD;  Location: [facility];  Service: Cardiovascular;  Laterality: N/A;   ABDOMINAL AORTOGRAM W/LOWER EXTREMITY N/A 04/09/2021   Procedure: ABDOMINAL AORTOGRAM W/LOWER EXTREMITY;  Surgeon: Waynetta Sandy, MD;  Location: [facility];  Service: Cardiovascular;  Laterality: N/A;   AMPUTATION Left 05/10/2016   Procedure: Left Transmetatarsal Amputation;  Surgeon: [name], MD;  Location: [facility];  Service: Orthopedics;  Laterality: Left;   AMPUTATION Right 05/16/2018   Procedure: PARTIAL RAY AMPUTATION FIFTH TOE RIGHT FOOT;  Surgeon: [name], DPM;  Location: [facility];  Service: Podiatry;  Laterality: Right;   AMPUTATION Right 06/17/2018   Procedure: AMPUTATION 4th RAY Right Foot;  Surgeon: Trula Slade, DPM;  Location: [facility];  Service: Podiatry;  Laterality: Right;   AMPUTATION Right 06/19/2018   Procedure: RIGHT BELOW KNEE AMPUTATION;  Surgeon: [name], MD;  Location: [facility];  Service: Orthopedics;  Laterality: Right;   AMPUTATION Left 10/05/2021   Procedure: LEFT RING FINGER AMPUTATION;  Surgeon: Daryll Brod, MD;  Location: [facility];  Service: Orthopedics;  Laterality: Left;  45 MIN   AMPUTATION Left 11/15/2021   Procedure: LEFT INDEX FINGER AMPUTATION;  Surgeon: Leanora Cover, MD;  Location: [facility];  Service: Orthopedics;  Laterality: Left;  60 MIN   AMPUTATION Right 01/08/2022   Procedure: AMPUTATION ABOVE KNEE;  Surgeon: Waynetta Sandy, MD;  Location: [facility];  Service: Vascular;  Laterality: Right;   AMPUTATION Bilateral 01/12/2022   Procedure: LEFT INDEX FINGER AMPUTATION, RIGHT INDEX, LONG AND SMALL DIGITS AMPUTATION;  Surgeon: Leanora Cover, MD;  Location: [facility];  Service: Orthopedics;  Laterality: Bilateral;   AMPUTATION Right 04/01/2022   Procedure: RIGHT INDEX FINGER AMPUTATION AT METACARPAL PHALANGEAL JOINT, INCISION AND DRAINAGE  RIGHT HAND;  Surgeon: Leanora Cover, MD;  Location: [facility];  Service: Orthopedics;  Laterality: Right;  90 MIN   AMPUTATION Right 05/23/2022   Procedure: RIGHT INDEX  FINGER RAY AMPUTATION; RIGHT LONG, RING, AND SMALL FINGER AMPUTATIONS;  Surgeon: Leanora Cover, MD;  Location: [facility];  Service: Orthopedics;  Laterality: Right;  90 MIN   AMPUTATION Left 08/07/2022   Procedure: LEFT ABOVE KNEE AMPUTATION;  Surgeon: [name], MD;  Location: [facility];  Service: Orthopedics;  Laterality: Left;   AORTIC ARCH ANGIOGRAPHY N/A 01/07/2022   Procedure: AORTIC ARCH ANGIOGRAPHY;  Surgeon: Waynetta Sandy, MD;  Location: [facility];  Service: Cardiovascular;  Laterality: N/A;   APPLICATION OF WOUND VAC Right 06/19/2018   Procedure: APPLICATION OF WOUND VAC;  Surgeon: [name], MD;  Location: [facility];  Service: Orthopedics;  Laterality: Right;   APPLICATION OF WOUND VAC Left 08/07/2022   Procedure: APPLICATION OF WOUND VAC;  Surgeon: [name], MD;  Location: [facility];  Service: Orthopedics;  Laterality: Left;   AV FISTULA PLACEMENT Left 11/25/2019   Procedure: LEFT ARM Brachiocephalic  ARTERIOVENOUS (AV) FISTULA CREATION;  Surgeon: Rosetta Posner, MD;  Location: [facility];  Service: Vascular;  Laterality: Left;   [place] Left 07/19/2020   Procedure: LEFT SECOND STAGE [place];  Surgeon: Waynetta Sandy, MD;  Location: [facility];  Service: Vascular;  Laterality: Left;   CIRCUMCISION  09/02/2011   Procedure: CIRCUMCISION ADULT;  Surgeon: Molli Hazard, MD;  Location: [facility];  Service: Urology;  Laterality: N/A;   CORONARY STENT INTERVENTION N/A 07/29/2018   Procedure: CORONARY STENT INTERVENTION;  Surgeon: Leonie Man, MD;  Location: [facility];  Service: Cardiovascular;  Laterality: N/A;   DEBRIDEMENT AND CLOSURE WOUND Left 11/15/2021   Procedure: LEFT HAND WOUND CLOSURE;  Surgeon: Leanora Cover, MD;  Location:  [facility];  Service: Orthopedics;  Laterality: Left;   I & D EXTREMITY  09/02/2011   Procedure: IRRIGATION AND DEBRIDEMENT EXTREMITY;  Surgeon: Theodoro Kos, DO;  Location: [facility];  Service: Plastics;  Laterality: N/A;   INCISION AND DRAINAGE OF WOUND  08/12/2011   Procedure: IRRIGATION AND DEBRIDEMENT WOUND;  Surgeon: Zenovia Jarred, MD;  Location: [facility];  Service: General;;  perineum   INSERTION OF DIALYSIS CATHETER N/A 11/25/2019   Procedure: INSERTION OF Righ Internal Jugular DIALYSIS CATHETER;  Surgeon: Rosetta Posner, MD;  Location: [facility];  Service: Vascular;  Laterality: N/A;   [place]  08/12/2011   Procedure: IRRIGATION AND DEBRIDEMENT ABSCESS;  Surgeon: Molli Hazard, MD;  Location: [facility];  Service: Urology;  Laterality: N/A;  irrigation and debridment perineum     IRRIGATION AND DEBRIDEMENT ABSCESS  08/14/2011   Procedure: MINOR INCISION AND DRAINAGE OF ABSCESS;  Surgeon: Molli Hazard, MD;  Location: [facility];  Service: Urology;  Laterality: N/A;  Perineal Wound Debridement;Placement Bilateral Testicular Thigh Pouches   LEFT HEART CATH AND CORONARY ANGIOGRAPHY N/A 07/29/2018   Procedure: LEFT HEART CATH AND CORONARY ANGIOGRAPHY;  Surgeon: Leonie Man, MD;  Location: [facility];  Service: Cardiovascular;  Laterality: N/A;   LOWER EXTREMITY ANGIOGRAPHY Bilateral 01/07/2022   Procedure: Lower Extremity Angiography;  Surgeon: Waynetta Sandy, MD;  Location: [facility];  Service: Cardiovascular;  Laterality: Bilateral;   PERIPHERAL VASCULAR ATHERECTOMY Left 04/09/2021   Procedure: PERIPHERAL VASCULAR ATHERECTOMY;  Surgeon: Waynetta Sandy, MD;  Location: [facility];  Service: Cardiovascular;  Laterality: Left;  TP trunk and peroneal arteries   PERIPHERAL VASCULAR BALLOON ANGIOPLASTY Left 04/09/2021   Procedure: PERIPHERAL VASCULAR BALLOON  ANGIOPLASTY;  Surgeon: Waynetta Sandy,  MD;  Location: [facility];  Service: Cardiovascular;  Laterality: Left;  Popliteal   REVISON OF ARTERIOVENOUS FISTULA Left 05/24/2020   Procedure: LEFT ARM ARTERIOVENOUS FISTULA  CONVERSION TO BASILIC VEIN FISTULA;  Surgeon: Waynetta Sandy, MD;  Location: [facility];  Service: Vascular;  Laterality: Left;   TOTAL HIP ARTHROPLASTY Left 10/11/2016   Procedure: LEFT TOTAL HIP ARTHROPLASTY ANTERIOR APPROACH;  Surgeon: Mcarthur Rossetti, MD;  Location[facility];  Service: Orthopedics;  Laterality: Left;   UPPER EXTREMITY ANGIOGRAPHY Bilateral 01/07/2022   Procedure: Upper Extremity Angiography;  Surgeon: Waynetta Sandy, MD;  Location: [facility];  Service: Cardiovascular;  Laterality: Bilateral;   Social History   Occupational History   Occupation: Disabled  Tobacco Use   Smoking status: Former    Packs/day: 0.25    Years: 24.00    Total pack years: 6.00    Types: Cigarettes    Quit date: 11/19/2019    Years since quitting: 2.8   Smokeless tobacco: Never  Vaping Use   Vaping Use: Never used  Substance and Sexual Activity   Alcohol use: Not Currently    Alcohol/week: 0.0 standard drinks of alcohol    Comment: quit 2018   Drug use: No   Sexual activity: Not Currently    Birth control/protection: Condom

## 2022-09-19 NOTE — Telephone Encounter (Signed)
Called and advised Edward [name] that the pt is being sch for revision surgery tomorrow of his left AKA. Lm on vm to advise.

## 2022-09-19 NOTE — Anesthesia Preprocedure Evaluation (Addendum)
Anesthesia Evaluation  Patient identified by MRN, date of birth, ID band Patient awake    Reviewed: Allergy & Precautions, NPO status , Patient's Chart, lab work & pertinent test results  History of Anesthesia Complications Negative for: history of anesthetic complications  Airway Mallampati: II  TM Distance: >3 FB Neck ROM: Full    Dental  (+) Dental Advisory Given, Missing   Pulmonary shortness of breath, sleep apnea (noncompliant) , former smoker   Pulmonary exam normal        Cardiovascular hypertension, + CAD, + Past MI, + Cardiac Stents, + Peripheral Vascular Disease and +CHF  Normal cardiovascular exam+ Valvular Problems/Murmurs MR    '22 TTE - EF 40 to 45%. Moderate LVH. Mild RV systolic dysfx. Mild AI. Severe MR. Mild PR. Severe TR. Trivial pericardial effusion.     Neuro/Psych  PSYCHIATRIC DISORDERS Anxiety Depression    TIA Neuromuscular disease    GI/Hepatic Neg liver ROS,GERD  Medicated and Controlled,,  Endo/Other  diabetes, Poorly Controlled, Type 2, Insulin Dependent  Morbid obesity  Renal/GU ESRF and DialysisRenal disease  negative genitourinary   Musculoskeletal  (+) Arthritis , Osteoarthritis,  S/p left AKA with open wound   Abdominal   Peds  Hematology  (+) Blood dyscrasia, anemia   Anesthesia Other Findings   Reproductive/Obstetrics                              Anesthesia Physical Anesthesia Plan  ASA: 4  Anesthesia Plan: General   Post-op Pain Management: Ketamine IV* and Tylenol PO (pre-op)*   Induction: Intravenous  PONV Risk Score and Plan: 2 and Treatment may vary due to age or medical condition, Ondansetron, Dexamethasone, Midazolam and Propofol infusion  Airway Management Planned: LMA  Additional Equipment: None  Intra-op Plan:   Post-operative Plan: Extubation in OR  Informed Consent: I have reviewed the patients History and Physical, chart, labs  and discussed the procedure including the risks, benefits and alternatives for the proposed anesthesia with the patient or authorized representative who has indicated his/her understanding and acceptance.     Dental advisory given  Plan Discussed with: CRNA and Anesthesiologist  Anesthesia Plan Comments: (PAT note by Karoline Caldwell, PA-C: Follows with cardiology for history of CAD with prior PCI (LCX, OM, LAD) HFrEF 40-45%, mild AI, Severe MR and TR, Prior TIA, PAD with prior BKA and left transmetatarsal imitation as well as multiple amputations, hx of Mobitz I HB.  He was last seen by cardiology on 03/29/2022 by Jory Sims, NP.  He was noted to be stable at that time and cleared to undergo right index finger amputation which she did have on 04/01/2022.  He subsequently underwent right index finger ray amputation on 05/23/2022 and left above-knee amputation on 08/07/2022.  He now has dehiscence of left AKA.  ESRD on HD Tuesday Thursday Saturday.   IDDM 2, last A1c 6.3 on 05/10/2022.   Patient will need day of surgery labs and evaluation.   EKG 08/12/2022: Sinus rhythm with first-degree AV block.  Left axis deviation.  Anterolateral infarct.  Rate 88.   Event monitor 01/01/2022: Sinus bradycardia, NSR, occasional PAC, brief PAT, 5 beats NSVT and occasional Mobitz 1 second degree AV block.   TTE 07/06/21 (care everywhere):   MILD LV DYSFUNCTION (See above, EF 40 to 45%) WITH MODERATE LVH    ELEVATED LA PRESSURES WITH DIASTOLIC DYSFUNCTION    MILD RV SYSTOLIC DYSFUNCTION (See above)  VALVULAR REGURGITATION: MILD AR, SEVERE MR, MILD PR, SEVERE TR    NO VALVULAR STENOSIS    TRIVIAL PERICARDIAL EFFUSION    SEVERE TR WITH SYSTOLIC FLOW REVERSAL IN HEPATIC VEIN. ERO 0.4 cm2, RV 33    mL.    SEVERE MR WITH FLOW REVERSAL IN PULMONARY VEINS. ERO 0.1 cm2, RV 20 mL.    Stress echo 09/19/2020:  1. This is a negative stress echocardiogram for ischemia.   2. This is a low risk study.   )          Anesthesia Quick Evaluation

## 2022-09-19 NOTE — Progress Notes (Signed)
PCP - Dr [name] Cardiologist - Dr Kirk Ruths Nephrology - Dr Otelia Santee  Chest x-ray - 08/22/22 (2V) EKG - 08/12/22 Stress Test - 09/19/20 ECHO - 09/27/20 Cardiac Cath - 07/29/18  ICD Pacemaker/Loop - n/a  Sleep Study -  Yes CPAP - does not use CPAP  Do not take Tradjenta on the morning of surgery.  Patient does not check his blood sugar.    Aspirin Instructions: Follow your surgeon's ASA instructions prior to surgery.  Continue.    Plavix Instructions: Hold 5 days prior to procedure.  Last dose was on 09/12/22.  ERAS: Clear liquids til 9 AM DOS  Anesthesia review: Yes  STOP now taking any Aspirin (unless otherwise instructed by your surgeon), Aleve, Naproxen, Ibuprofen, Motrin, Advil, Goody's, BC's, all herbal medications, fish oil, and all vitamins.   Coronavirus Screening Do you have any of the following symptoms:  Cough yes/no: No Fever (>100.38F)  yes/no: No Runny nose yes/no: No Sore throat yes/no: No Difficulty breathing/shortness of breath  yes/no: No  Have you traveled in the last 14 days and where? yes/no: No  Patient verbalized understanding of instructions that were given via phone.

## 2022-09-19 NOTE — Progress Notes (Signed)
Anesthesia Chart Review: Same day workup  Follows with cardiology for history of CAD with prior PCI (LCX, OM, LAD) HFrEF 40-45%, mild AI, Severe MR and TR, Prior TIA, PAD with prior BKA and left transmetatarsal imitation as well as multiple amputations, hx of Mobitz I HB.  He was last seen by cardiology on 03/29/2022 by Jory Sims, NP.  He was noted to be stable at that time and cleared to undergo right index finger amputation which she did have on 04/01/2022.  He subsequently underwent right index finger ray amputation on 05/23/2022 and left above-knee amputation on 08/07/2022.  He now has dehiscence of left AKA.  ESRD on HD Tuesday Thursday Saturday.   IDDM 2, last A1c 6.3 on 05/10/2022.   Patient will need day of surgery labs and evaluation.   EKG 08/12/2022: Sinus rhythm with first-degree AV block.  Left axis deviation.  Anterolateral infarct.  Rate 88.   Event monitor 01/01/2022: Sinus bradycardia, NSR, occasional PAC, brief PAT, 5 beats NSVT and occasional Mobitz 1 second degree AV block.   TTE 07/06/21 (care everywhere):   MILD LV DYSFUNCTION (See above, EF 40 to 45%) WITH MODERATE LVH    ELEVATED LA PRESSURES WITH DIASTOLIC DYSFUNCTION    MILD RV SYSTOLIC DYSFUNCTION (See above)    VALVULAR REGURGITATION: MILD AR, SEVERE MR, MILD PR, SEVERE TR    NO VALVULAR STENOSIS    TRIVIAL PERICARDIAL EFFUSION    SEVERE TR WITH SYSTOLIC FLOW REVERSAL IN HEPATIC VEIN. ERO 0.4 cm2, RV 33    mL.    SEVERE MR WITH FLOW REVERSAL IN PULMONARY VEINS. ERO 0.1 cm2, RV 20 mL.    Stress echo 09/19/2020:  1. This is a negative stress echocardiogram for ischemia.   2. This is a low risk study.     Edward, [name] [facility]/Anesthesiology Phone [phone] 09/19/2022 4:20 PM

## 2022-09-19 NOTE — Telephone Encounter (Signed)
Pt has an appt today for follow up.

## 2022-09-20 ENCOUNTER — Inpatient Hospital Stay (HOSPITAL_COMMUNITY): Payer: 59 | Admitting: Physician Assistant

## 2022-09-20 ENCOUNTER — Telehealth: Payer: Self-pay

## 2022-09-20 ENCOUNTER — Encounter (HOSPITAL_COMMUNITY): Payer: Self-pay | Admitting: Orthopedic Surgery

## 2022-09-20 ENCOUNTER — Inpatient Hospital Stay (HOSPITAL_COMMUNITY)
Admission: RE | Admit: 2022-09-20 | Discharge: 2022-09-23 | DRG: 498 | Disposition: A | Discharge status: Home Health | Payer: 59 | Source: Ambulatory Visit | Attending: Orthopedic Surgery | Admitting: Orthopedic Surgery

## 2022-09-20 ENCOUNTER — Other Ambulatory Visit: Payer: Self-pay

## 2022-09-20 ENCOUNTER — Encounter (HOSPITAL_COMMUNITY)
Admission: RE | Disposition: A | Discharge status: Home Health | Payer: Self-pay | Source: Ambulatory Visit | Attending: Orthopedic Surgery

## 2022-09-20 DIAGNOSIS — I509 Heart failure, unspecified: Secondary | ICD-10-CM

## 2022-09-20 DIAGNOSIS — Z89612 Acquired absence of left leg above knee: Secondary | ICD-10-CM

## 2022-09-20 DIAGNOSIS — M1990 Unspecified osteoarthritis, unspecified site: Secondary | ICD-10-CM | POA: Diagnosis not present

## 2022-09-20 DIAGNOSIS — T8130XA Disruption of wound, unspecified, initial encounter: Secondary | ICD-10-CM | POA: Diagnosis not present

## 2022-09-20 DIAGNOSIS — E876 Hypokalemia: Secondary | ICD-10-CM | POA: Diagnosis not present

## 2022-09-20 DIAGNOSIS — N186 End stage renal disease: Secondary | ICD-10-CM | POA: Diagnosis not present

## 2022-09-20 DIAGNOSIS — Z89611 Acquired absence of right leg above knee: Secondary | ICD-10-CM | POA: Diagnosis not present

## 2022-09-20 DIAGNOSIS — I2510 Atherosclerotic heart disease of native coronary artery without angina pectoris: Secondary | ICD-10-CM

## 2022-09-20 DIAGNOSIS — N2581 Secondary hyperparathyroidism of renal origin: Secondary | ICD-10-CM | POA: Diagnosis not present

## 2022-09-20 DIAGNOSIS — I252 Old myocardial infarction: Secondary | ICD-10-CM | POA: Diagnosis not present

## 2022-09-20 DIAGNOSIS — I120 Hypertensive chronic kidney disease with stage 5 chronic kidney disease or end stage renal disease: Secondary | ICD-10-CM | POA: Diagnosis not present

## 2022-09-20 DIAGNOSIS — E1151 Type 2 diabetes mellitus with diabetic peripheral angiopathy without gangrene: Secondary | ICD-10-CM | POA: Diagnosis present

## 2022-09-20 DIAGNOSIS — Z89021 Acquired absence of right finger(s): Secondary | ICD-10-CM | POA: Diagnosis not present

## 2022-09-20 DIAGNOSIS — K219 Gastro-esophageal reflux disease without esophagitis: Secondary | ICD-10-CM | POA: Diagnosis not present

## 2022-09-20 DIAGNOSIS — Z992 Dependence on renal dialysis: Secondary | ICD-10-CM | POA: Diagnosis not present

## 2022-09-20 DIAGNOSIS — Y835 Amputation of limb(s) as the cause of abnormal reaction of the patient, or of later complication, without mention of misadventure at the time of the procedure: Secondary | ICD-10-CM | POA: Diagnosis present

## 2022-09-20 DIAGNOSIS — E1122 Type 2 diabetes mellitus with diabetic chronic kidney disease: Secondary | ICD-10-CM | POA: Diagnosis present

## 2022-09-20 DIAGNOSIS — M898X9 Other specified disorders of bone, unspecified site: Secondary | ICD-10-CM | POA: Diagnosis not present

## 2022-09-20 DIAGNOSIS — Z89022 Acquired absence of left finger(s): Secondary | ICD-10-CM | POA: Diagnosis not present

## 2022-09-20 DIAGNOSIS — T8781 Dehiscence of amputation stump: Principal | ICD-10-CM | POA: Diagnosis present

## 2022-09-20 DIAGNOSIS — Z833 Family history of diabetes mellitus: Secondary | ICD-10-CM

## 2022-09-20 DIAGNOSIS — Z87891 Personal history of nicotine dependence: Secondary | ICD-10-CM | POA: Diagnosis not present

## 2022-09-20 DIAGNOSIS — N250 Renal osteodystrophy: Secondary | ICD-10-CM | POA: Diagnosis not present

## 2022-09-20 DIAGNOSIS — Z888 Allergy status to other drugs, medicaments and biological substances status: Secondary | ICD-10-CM | POA: Diagnosis not present

## 2022-09-20 DIAGNOSIS — I132 Hypertensive heart and chronic kidney disease with heart failure and with stage 5 chronic kidney disease, or end stage renal disease: Secondary | ICD-10-CM | POA: Diagnosis not present

## 2022-09-20 DIAGNOSIS — E8779 Other fluid overload: Secondary | ICD-10-CM | POA: Diagnosis not present

## 2022-09-20 DIAGNOSIS — Z955 Presence of coronary angioplasty implant and graft: Secondary | ICD-10-CM | POA: Diagnosis not present

## 2022-09-20 DIAGNOSIS — Z96642 Presence of left artificial hip joint: Secondary | ICD-10-CM | POA: Diagnosis present

## 2022-09-20 DIAGNOSIS — D631 Anemia in chronic kidney disease: Secondary | ICD-10-CM | POA: Diagnosis present

## 2022-09-20 HISTORY — DX: Nausea with vomiting, unspecified: R11.2

## 2022-09-20 HISTORY — PX: STUMP REVISION: SHX6102

## 2022-09-20 LAB — POCT I-STAT, CHEM 8
BUN: 32 mg/dL — ABNORMAL HIGH (ref 6–20)
Calcium, Ion: 0.9 mmol/L — ABNORMAL LOW (ref 1.15–1.40)
Chloride: 97 mmol/L — ABNORMAL LOW (ref 98–111)
Creatinine, Ser: 5.9 mg/dL — ABNORMAL HIGH (ref 0.61–1.24)
Glucose, Bld: 214 mg/dL — ABNORMAL HIGH (ref 70–99)
HCT: 41 % (ref 39.0–52.0)
Hemoglobin: 13.9 g/dL (ref 13.0–17.0)
Potassium: 3.5 mmol/L (ref 3.5–5.1)
Sodium: 136 mmol/L (ref 135–145)
TCO2: 27 mmol/L (ref 22–32)

## 2022-09-20 LAB — GLUCOSE, CAPILLARY
Glucose-Capillary: 210 mg/dL — ABNORMAL HIGH (ref 70–99)
Glucose-Capillary: 159 mg/dL — ABNORMAL HIGH (ref 70–99)
Glucose-Capillary: 143 mg/dL — ABNORMAL HIGH (ref 70–99)
Glucose-Capillary: 57 mg/dL — ABNORMAL LOW (ref 70–99)
Glucose-Capillary: 75 mg/dL (ref 70–99)

## 2022-09-20 SURGERY — REVISION, AMPUTATION SITE
Anesthesia: General | Laterality: Left

## 2022-09-20 MED ORDER — KETAMINE HCL 50 MG/5ML IJ SOSY
PREFILLED_SYRINGE | INTRAMUSCULAR | Status: AC
  Filled 2022-09-20: qty 5

## 2022-09-20 MED ORDER — OXYCODONE HCL 5 MG PO TABS
5.0000 mg | ORAL_TABLET | ORAL | Status: DC | PRN
  Administered 2022-09-20 – 2022-09-22 (×3): 10 mg via ORAL
  Filled 2022-09-20: qty 2

## 2022-09-20 MED ORDER — LABETALOL HCL 5 MG/ML IV SOLN: 10.0000 mg | INTRAVENOUS | Status: DC | PRN

## 2022-09-20 MED ORDER — LINAGLIPTIN 5 MG PO TABS
5.0000 mg | ORAL_TABLET | Freq: Every day | ORAL | Status: DC
  Administered 2022-09-20 – 2022-09-23 (×4): 5 mg via ORAL
  Filled 2022-09-20 (×4): qty 1

## 2022-09-20 MED ORDER — ROCURONIUM BROMIDE 10 MG/ML (PF) SYRINGE
PREFILLED_SYRINGE | INTRAVENOUS | Status: AC
  Filled 2022-09-20: qty 10

## 2022-09-20 MED ORDER — OXYCODONE HCL 5 MG PO TABS: 5.0000 mg | ORAL_TABLET | Freq: Once | ORAL | Status: DC | PRN

## 2022-09-20 MED ORDER — ALPRAZOLAM 0.25 MG PO TABS
0.2500 mg | ORAL_TABLET | Freq: Every day | ORAL | Status: DC
  Administered 2022-09-20 – 2022-09-23 (×4): 0.25 mg via ORAL
  Filled 2022-09-20 (×4): qty 1

## 2022-09-20 MED ORDER — ONDANSETRON HCL 4 MG/2ML IJ SOLN: 4.0000 mg | Freq: Four times a day (QID) | INTRAMUSCULAR | Status: DC | PRN

## 2022-09-20 MED ORDER — POTASSIUM CHLORIDE CRYS ER 20 MEQ PO TBCR: 20.0000 meq | EXTENDED_RELEASE_TABLET | Freq: Every day | ORAL | Status: DC | PRN

## 2022-09-20 MED ORDER — VANCOMYCIN HCL 500 MG IV SOLR
INTRAVENOUS | Status: DC | PRN
  Administered 2022-09-20: 500 mg via TOPICAL

## 2022-09-20 MED ORDER — DOCUSATE SODIUM 100 MG PO CAPS
100.0000 mg | ORAL_CAPSULE | Freq: Every day | ORAL | Status: DC
  Administered 2022-09-21 – 2022-09-22 (×2): 100 mg via ORAL
  Filled 2022-09-20 (×3): qty 1

## 2022-09-20 MED ORDER — EPHEDRINE SULFATE (PRESSORS) 50 MG/ML IJ SOLN
INTRAMUSCULAR | Status: DC | PRN
  Administered 2022-09-20 (×2): 10 mg via INTRAVENOUS

## 2022-09-20 MED ORDER — MAGNESIUM SULFATE 2 GM/50ML IV SOLN: 2.0000 g | Freq: Every day | INTRAVENOUS | Status: DC | PRN

## 2022-09-20 MED ORDER — PHENYLEPHRINE HCL (PRESSORS) 10 MG/ML IV SOLN
INTRAVENOUS | Status: DC | PRN
  Administered 2022-09-20 (×2): 80 ug via INTRAVENOUS
  Administered 2022-09-20 (×2): 100 ug via INTRAVENOUS
  Administered 2022-09-20: 80 ug via INTRAVENOUS

## 2022-09-20 MED ORDER — HYDRALAZINE HCL 20 MG/ML IJ SOLN: 5.0000 mg | INTRAMUSCULAR | Status: DC | PRN

## 2022-09-20 MED ORDER — VANCOMYCIN HCL 500 MG IV SOLR
INTRAVENOUS | Status: AC
  Filled 2022-09-20: qty 10

## 2022-09-20 MED ORDER — OXYCODONE HCL 5 MG PO TABS
10.0000 mg | ORAL_TABLET | ORAL | Status: DC | PRN
  Administered 2022-09-23: 15 mg via ORAL
  Filled 2022-09-20: qty 3
  Filled 2022-09-20: qty 2
  Filled 2022-09-20: qty 3
  Filled 2022-09-20: qty 2

## 2022-09-20 MED ORDER — ETOMIDATE 2 MG/ML IV SOLN
INTRAVENOUS | Status: DC | PRN
  Administered 2022-09-20: 14 mg via INTRAVENOUS

## 2022-09-20 MED ORDER — GABAPENTIN 100 MG PO CAPS
100.0000 mg | ORAL_CAPSULE | Freq: Three times a day (TID) | ORAL | Status: DC
  Administered 2022-09-20 – 2022-09-23 (×6): 100 mg via ORAL
  Filled 2022-09-20 (×6): qty 1

## 2022-09-20 MED ORDER — KETAMINE HCL 10 MG/ML IJ SOLN
INTRAMUSCULAR | Status: DC | PRN
  Administered 2022-09-20: 20 mg via INTRAVENOUS

## 2022-09-20 MED ORDER — ATORVASTATIN CALCIUM 80 MG PO TABS
80.0000 mg | ORAL_TABLET | Freq: Every day | ORAL | Status: DC
  Administered 2022-09-20 – 2022-09-23 (×4): 80 mg via ORAL
  Filled 2022-09-20 (×4): qty 1

## 2022-09-20 MED ORDER — OXYCODONE HCL 5 MG/5ML PO SOLN: 5.0000 mg | Freq: Once | ORAL | Status: DC | PRN

## 2022-09-20 MED ORDER — CEFAZOLIN SODIUM-DEXTROSE 2-4 GM/100ML-% IV SOLN
2.0000 g | INTRAVENOUS | Status: AC
  Administered 2022-09-20: 2 g via INTRAVENOUS
  Filled 2022-09-20: qty 100

## 2022-09-20 MED ORDER — METOPROLOL TARTRATE 5 MG/5ML IV SOLN: 2.0000 mg | INTRAVENOUS | Status: DC | PRN

## 2022-09-20 MED ORDER — FENTANYL CITRATE (PF) 100 MCG/2ML IJ SOLN
INTRAMUSCULAR | Status: AC
  Filled 2022-09-20: qty 2

## 2022-09-20 MED ORDER — FENTANYL CITRATE (PF) 250 MCG/5ML IJ SOLN
INTRAMUSCULAR | Status: DC | PRN
  Administered 2022-09-20 (×3): 25 ug via INTRAVENOUS

## 2022-09-20 MED ORDER — TRANEXAMIC ACID-NACL 1000-0.7 MG/100ML-% IV SOLN
1000.0000 mg | INTRAVENOUS | Status: AC
  Administered 2022-09-20: 1000 mg via INTRAVENOUS
  Filled 2022-09-20: qty 100

## 2022-09-20 MED ORDER — INSULIN ASPART 100 UNIT/ML IJ SOLN
0.0000 [IU] | INTRAMUSCULAR | Status: AC | PRN
  Administered 2022-09-20: 2 [IU] via SUBCUTANEOUS
  Filled 2022-09-20: qty 1

## 2022-09-20 MED ORDER — LIDOCAINE 2% (20 MG/ML) 5 ML SYRINGE
INTRAMUSCULAR | Status: AC
  Filled 2022-09-20: qty 5

## 2022-09-20 MED ORDER — ALUM & MAG HYDROXIDE-SIMETH 200-200-20 MG/5ML PO SUSP: 15.0000 mL | ORAL | Status: DC | PRN

## 2022-09-20 MED ORDER — MIDODRINE HCL 5 MG PO TABS
10.0000 mg | ORAL_TABLET | Freq: Two times a day (BID) | ORAL | Status: DC
  Administered 2022-09-20 – 2022-09-23 (×5): 10 mg via ORAL
  Filled 2022-09-20 (×5): qty 2

## 2022-09-20 MED ORDER — PROPOFOL 10 MG/ML IV BOLUS
INTRAVENOUS | Status: AC
  Filled 2022-09-20: qty 20

## 2022-09-20 MED ORDER — METOCLOPRAMIDE HCL 5 MG/ML IJ SOLN
INTRAMUSCULAR | Status: DC | PRN
  Administered 2022-09-20: 5 mg via INTRAVENOUS

## 2022-09-20 MED ORDER — ACETAMINOPHEN 325 MG PO TABS: 325.0000 mg | ORAL_TABLET | Freq: Four times a day (QID) | ORAL | Status: DC | PRN

## 2022-09-20 MED ORDER — METOCLOPRAMIDE HCL 5 MG/ML IJ SOLN
INTRAMUSCULAR | Status: AC
  Filled 2022-09-20: qty 2

## 2022-09-20 MED ORDER — MAGNESIUM CITRATE PO SOLN: 1.0000 | Freq: Once | ORAL | Status: DC | PRN

## 2022-09-20 MED ORDER — 0.9 % SODIUM CHLORIDE (POUR BTL) OPTIME
TOPICAL | Status: DC | PRN
  Administered 2022-09-20: 1000 mL

## 2022-09-20 MED ORDER — VITAMIN C 500 MG PO TABS
1000.0000 mg | ORAL_TABLET | Freq: Every day | ORAL | Status: DC
  Administered 2022-09-20 – 2022-09-23 (×4): 1000 mg via ORAL
  Filled 2022-09-20 (×4): qty 2

## 2022-09-20 MED ORDER — OXYCODONE HCL 5 MG/5ML PO SOLN
ORAL | Status: AC
  Filled 2022-09-20: qty 5

## 2022-09-20 MED ORDER — SUCROFERRIC OXYHYDROXIDE 500 MG PO CHEW
1000.0000 mg | CHEWABLE_TABLET | Freq: Three times a day (TID) | ORAL | Status: DC
  Administered 2022-09-20 – 2022-09-23 (×5): 1000 mg via ORAL
  Filled 2022-09-20 (×5): qty 2

## 2022-09-20 MED ORDER — LACTATED RINGERS IV SOLN: INTRAVENOUS | Status: DC | PRN

## 2022-09-20 MED ORDER — PHENOL 1.4 % MT LIQD: 1.0000 | OROMUCOSAL | Status: DC | PRN

## 2022-09-20 MED ORDER — ONDANSETRON HCL 4 MG/2ML IJ SOLN
INTRAMUSCULAR | Status: AC
  Filled 2022-09-20: qty 2

## 2022-09-20 MED ORDER — ETOMIDATE 2 MG/ML IV SOLN
INTRAVENOUS | Status: AC
  Filled 2022-09-20: qty 10

## 2022-09-20 MED ORDER — LINACLOTIDE 72 MCG PO CAPS
72.0000 ug | ORAL_CAPSULE | Freq: Every morning | ORAL | Status: DC
  Administered 2022-09-21: 72 ug via ORAL
  Filled 2022-09-20 (×4): qty 1

## 2022-09-20 MED ORDER — ALLOPURINOL 100 MG PO TABS
100.0000 mg | ORAL_TABLET | Freq: Two times a day (BID) | ORAL | Status: DC
  Administered 2022-09-20 – 2022-09-23 (×5): 100 mg via ORAL
  Filled 2022-09-20 (×5): qty 1

## 2022-09-20 MED ORDER — INSULIN ASPART 100 UNIT/ML IJ SOLN
2.0000 [IU] | Freq: Three times a day (TID) | INTRAMUSCULAR | Status: DC
  Administered 2022-09-20: 2 [IU] via SUBCUTANEOUS

## 2022-09-20 MED ORDER — ONDANSETRON HCL 4 MG/2ML IJ SOLN
INTRAMUSCULAR | Status: DC | PRN
  Administered 2022-09-20: 4 mg via INTRAVENOUS

## 2022-09-20 MED ORDER — PROPOFOL 10 MG/ML IV BOLUS
INTRAVENOUS | Status: DC | PRN
  Administered 2022-09-20 (×2): 20 mg via INTRAVENOUS

## 2022-09-20 MED ORDER — INSULIN ASPART 100 UNIT/ML IJ SOLN
0.0000 [IU] | Freq: Three times a day (TID) | INTRAMUSCULAR | Status: DC
  Administered 2022-09-20: 1 [IU] via SUBCUTANEOUS
  Administered 2022-09-22: 2 [IU] via SUBCUTANEOUS
  Administered 2022-09-22: 1 [IU] via SUBCUTANEOUS

## 2022-09-20 MED ORDER — SODIUM BICARBONATE 650 MG PO TABS
650.0000 mg | ORAL_TABLET | Freq: Every day | ORAL | Status: DC
  Administered 2022-09-20 – 2022-09-21 (×2): 650 mg via ORAL
  Filled 2022-09-20 (×2): qty 1

## 2022-09-20 MED ORDER — INSULIN ASPART 100 UNIT/ML IJ SOLN
INTRAMUSCULAR | Status: AC
  Administered 2022-09-20: 2 [IU] via SUBCUTANEOUS
  Filled 2022-09-20: qty 1

## 2022-09-20 MED ORDER — CLOPIDOGREL BISULFATE 75 MG PO TABS
75.0000 mg | ORAL_TABLET | Freq: Every day | ORAL | Status: DC
  Administered 2022-09-20 – 2022-09-23 (×4): 75 mg via ORAL
  Filled 2022-09-20 (×4): qty 1

## 2022-09-20 MED ORDER — ORAL CARE MOUTH RINSE: 15.0000 mL | Freq: Once | OROMUCOSAL | Status: AC

## 2022-09-20 MED ORDER — PANTOPRAZOLE SODIUM 40 MG PO TBEC: 40.0000 mg | DELAYED_RELEASE_TABLET | Freq: Every day | ORAL | Status: DC

## 2022-09-20 MED ORDER — GUAIFENESIN-DM 100-10 MG/5ML PO SYRP: 15.0000 mL | ORAL_SOLUTION | ORAL | Status: DC | PRN

## 2022-09-20 MED ORDER — FENTANYL CITRATE (PF) 100 MCG/2ML IJ SOLN: 25.0000 ug | INTRAMUSCULAR | Status: DC | PRN

## 2022-09-20 MED ORDER — CEFAZOLIN SODIUM-DEXTROSE 2-4 GM/100ML-% IV SOLN: 2.0000 g | Freq: Three times a day (TID) | INTRAVENOUS | Status: DC

## 2022-09-20 MED ORDER — SODIUM CHLORIDE 0.9 % IV SOLN: INTRAVENOUS | Status: DC

## 2022-09-20 MED ORDER — FENTANYL CITRATE (PF) 250 MCG/5ML IJ SOLN
INTRAMUSCULAR | Status: AC
  Filled 2022-09-20: qty 5

## 2022-09-20 MED ORDER — SODIUM CHLORIDE 0.9 % IV SOLN: Freq: Once | INTRAVENOUS | Status: AC

## 2022-09-20 MED ORDER — ASPIRIN 81 MG PO TBEC
81.0000 mg | DELAYED_RELEASE_TABLET | Freq: Every day | ORAL | Status: DC
  Administered 2022-09-20 – 2022-09-23 (×4): 81 mg via ORAL
  Filled 2022-09-20 (×4): qty 1

## 2022-09-20 MED ORDER — CHLORHEXIDINE GLUCONATE 0.12 % MT SOLN
15.0000 mL | Freq: Once | OROMUCOSAL | Status: AC
  Administered 2022-09-20: 15 mL via OROMUCOSAL
  Filled 2022-09-20: qty 15

## 2022-09-20 MED ORDER — PANTOPRAZOLE SODIUM 40 MG PO TBEC
40.0000 mg | DELAYED_RELEASE_TABLET | Freq: Every day | ORAL | Status: DC
  Administered 2022-09-20 – 2022-09-23 (×4): 40 mg via ORAL
  Filled 2022-09-20 (×4): qty 1

## 2022-09-20 MED ORDER — POLYETHYLENE GLYCOL 3350 17 G PO PACK: 17.0000 g | PACK | Freq: Every day | ORAL | Status: DC | PRN

## 2022-09-20 MED ORDER — BISACODYL 5 MG PO TBEC: 5.0000 mg | DELAYED_RELEASE_TABLET | Freq: Every day | ORAL | Status: DC | PRN

## 2022-09-20 MED ORDER — LACTATED RINGERS IV SOLN: INTRAVENOUS | Status: DC

## 2022-09-20 MED ORDER — HYDROMORPHONE HCL 1 MG/ML IJ SOLN
0.5000 mg | INTRAMUSCULAR | Status: DC | PRN
  Administered 2022-09-22: 1 mg via INTRAVENOUS
  Filled 2022-09-20: qty 1

## 2022-09-20 MED ORDER — JUVEN PO PACK
1.0000 | PACK | Freq: Two times a day (BID) | ORAL | Status: DC
  Administered 2022-09-21 – 2022-09-23 (×4): 1 via ORAL
  Filled 2022-09-20 (×4): qty 1

## 2022-09-20 MED ORDER — ONDANSETRON HCL 4 MG/2ML IJ SOLN: 4.0000 mg | Freq: Once | INTRAMUSCULAR | Status: DC | PRN

## 2022-09-20 MED ORDER — ZINC SULFATE 220 (50 ZN) MG PO CAPS
220.0000 mg | ORAL_CAPSULE | Freq: Every day | ORAL | Status: DC
  Administered 2022-09-20 – 2022-09-23 (×4): 220 mg via ORAL
  Filled 2022-09-20 (×4): qty 1

## 2022-09-20 SURGICAL SUPPLY — 39 items
BAG COUNTER SPONGE SURGICOUNT (BAG) ×1 IMPLANT
BAG SPNG CNTER NS LX DISP (BAG) ×1
BLADE SAW RECIP 87.9 MT (BLADE) IMPLANT
BLADE SAW RECIPROCATING 77.5 (BLADE) IMPLANT
BLADE SURG 21 STRL SS (BLADE) ×2 IMPLANT
CANISTER WOUND CARE 500ML ATS (WOUND CARE) ×1 IMPLANT
CNTNR URN SCR LID CUP LEK RST (MISCELLANEOUS) IMPLANT
CONT SPEC 4OZ STRL OR WHT (MISCELLANEOUS) ×1
COVER SURGICAL LIGHT HANDLE (MISCELLANEOUS) ×2 IMPLANT
DRAPE DERMATAC (DRAPES) IMPLANT
DRAPE EXTREMITY T 121X128X90 (DISPOSABLE) ×1 IMPLANT
DRAPE HALF SHEET 40X57 (DRAPES) ×2 IMPLANT
DRAPE INCISE IOBAN 66X45 STRL (DRAPES) ×1 IMPLANT
DRAPE U-SHAPE 47X51 STRL (DRAPES) ×2 IMPLANT
DRESSING PREVENA PLUS CUSTOM (GAUZE/BANDAGES/DRESSINGS) ×1 IMPLANT
DRSG PREVENA PLUS CUSTOM (GAUZE/BANDAGES/DRESSINGS) ×2
DURAPREP 26ML APPLICATOR (WOUND CARE) ×2 IMPLANT
ELECT REM PT RETURN 9FT ADLT (ELECTROSURGICAL) ×1
ELECTRODE REM PT RTRN 9FT ADLT (ELECTROSURGICAL) ×1 IMPLANT
GLOVE BIOGEL PI IND STRL 9 (GLOVE) ×1 IMPLANT
GLOVE SURG ORTHO 9.0 STRL STRW (GLOVE) ×2 IMPLANT
GOWN STRL REUS W/ TWL XL LVL3 (GOWN DISPOSABLE) ×2 IMPLANT
GOWN STRL REUS W/TWL XL LVL3 (GOWN DISPOSABLE) ×2
GRAFT SKIN WND MICRO 38 (Tissue) IMPLANT
KIT BASIN OR (CUSTOM PROCEDURE TRAY) ×1 IMPLANT
KIT TURNOVER KIT B (KITS) ×1 IMPLANT
MANIFOLD NEPTUNE II (INSTRUMENTS) ×1 IMPLANT
NS IRRIG 1000ML POUR BTL (IV SOLUTION) ×1 IMPLANT
PACK GENERAL/GYN (CUSTOM PROCEDURE TRAY) ×1 IMPLANT
PAD ARMBOARD 7.5X6 YLW CONV (MISCELLANEOUS) ×1 IMPLANT
PREVENA RESTOR ARTHOFORM 33X30 (CANNISTER) IMPLANT
PREVENA RESTOR ARTHOFORM 46X30 (CANNISTER) ×1 IMPLANT
STAPLER VISISTAT 35W (STAPLE) IMPLANT
SUT ETHILON 2 0 PSLX (SUTURE) ×4 IMPLANT
SUT SILK 2 0 (SUTURE)
SUT SILK 2-0 18XBRD TIE 12 (SUTURE) IMPLANT
SUT VIC AB 1 CT1 27 (SUTURE) ×2
SUT VIC AB 1 CT1 27XBRD ANBCTR (SUTURE) IMPLANT
TOWEL GREEN STERILE (TOWEL DISPOSABLE) ×1 IMPLANT

## 2022-09-20 NOTE — Anesthesia Procedure Notes (Signed)
Procedure Name: LMA Insertion Date/Time: 09/20/2022 12:35 PM  Performed by: Timoteo Expose, CRNAPre-anesthesia Checklist: Patient identified, Emergency Drugs available, Suction available, Patient being monitored and Timeout performed Patient Re-evaluated:Patient Re-evaluated prior to induction Oxygen Delivery Method: Circle system utilized, Simple face mask, Non-rebreather mask, Nasal cannula, Ambu bag and Supernova nasal CPAP Preoxygenation: Pre-oxygenation with 100% oxygen Induction Type: IV induction LMA: LMA inserted LMA Size: 4.0

## 2022-09-20 NOTE — Transfer of Care (Signed)
Immediate Anesthesia Transfer of Care Note  Patient: Edward [name]  Procedure(s) Performed: LEFT ABOVE THE KNEE AMPUTATION REVISION (Left)  Patient Location: PACU  Anesthesia Type:General  Level of Consciousness: awake and alert   Airway & Oxygen Therapy: Patient Spontanous Breathing, Patient connected to nasal cannula oxygen, and Patient connected to face mask oxygen  Post-op Assessment: Report given to RN and Post -op Vital signs reviewed and stable  Post vital signs: Reviewed and stable  Last Vitals:  Vitals Value Taken Time  BP 103/82 09/20/22 1310  Temp 36.5 C 09/20/22 1310  Pulse 102 09/20/22 1311  Resp 12 09/20/22 1312  SpO2 100 % 09/20/22 1311  Vitals shown include unvalidated device data.  Last Pain:  Vitals:   09/20/22 1010  PainSc: 9       Patients Stated Pain Goal: 0 (92/01/00 7121)  Complications: No notable events documented.

## 2022-09-20 NOTE — H&P (Signed)
Edward [name] is an 52 y.o. male.   Chief Complaint: Purulent drainage left above-knee amputation. HPI: Patient is a 52 year old gentleman with severe peripheral vascular disease who is status post left above-knee amputation approximately 6 weeks ago.  Patient has noticed increased necrotic drainage from the surgical incision.  Past Medical History:  Diagnosis Date   Allergy    Anemia    getting Venofer with dialysis   Anxiety    self reported, sometimes-situational anxiety   Arthritis    bilateral hands/LEFT hip   CHF (congestive heart failure) (HCC)    Chronic kidney disease    t th s dialysis- [place] ---ESRD   Coronary artery disease    Depression    self reported, sometimes-situational depression   Diabetes mellitus    type II- on meds   Dyspnea    sometimes- fluid   GERD (gastroesophageal reflux disease)    on meds   History of blood transfusion    Hypertension    no longer on medication since starting on Hemodialysis   Myocardial infarction ([place]) 2019   Nausea and vomiting    Peripheral vascular disease ([place])    Sleep apnea    does not use CPAP    Past Surgical History:  Procedure Laterality Date   A/V FISTULAGRAM Left 03/06/2020   Procedure: A/V FISTULAGRAM;  Surgeon: Waynetta Sandy, MD;  Location: [facility];  Service: Cardiovascular;  Laterality: Left;   ABDOMINAL AORTOGRAM N/A 01/07/2022   Procedure: ABDOMINAL AORTOGRAM;  Surgeon: Waynetta Sandy, MD;  Location: [facility];  Service: Cardiovascular;  Laterality: N/A;   ABDOMINAL AORTOGRAM W/LOWER EXTREMITY N/A 04/09/2021   Procedure: ABDOMINAL AORTOGRAM W/LOWER EXTREMITY;  Surgeon: Waynetta Sandy, MD;  Location: [facility];  Service: Cardiovascular;  Laterality: N/A;   AMPUTATION Left 05/10/2016   Procedure: Left Transmetatarsal Amputation;  Surgeon: [name], MD;  Location: [place];  Service: Orthopedics;  Laterality: Left;   AMPUTATION Right 05/16/2018    Procedure: PARTIAL RAY AMPUTATION FIFTH TOE RIGHT FOOT;  Surgeon: [name], DPM;  Location: [place];  Service: Podiatry;  Laterality: Right;   AMPUTATION Right 06/17/2018   Procedure: AMPUTATION 4th RAY Right Foot;  Surgeon: Trula Slade, DPM;  Location: [place];  Service: Podiatry;  Laterality: Right;   AMPUTATION Right 06/19/2018   Procedure: RIGHT BELOW KNEE AMPUTATION;  Surgeon: [name], MD;  Location: [place];  Service: Orthopedics;  Laterality: Right;   AMPUTATION Left 10/05/2021   Procedure: LEFT RING FINGER AMPUTATION;  Surgeon: Daryll Brod, MD;  Location: [place];  Service: Orthopedics;  Laterality: Left;  45 MIN   AMPUTATION Left 11/15/2021   Procedure: LEFT INDEX FINGER AMPUTATION;  Surgeon: Leanora Cover, MD;  Location: [place];  Service: Orthopedics;  Laterality: Left;  60 MIN   AMPUTATION Right 01/08/2022   Procedure: AMPUTATION ABOVE KNEE;  Surgeon: Waynetta Sandy, MD;  Location: [place];  Service: Vascular;  Laterality: Right;   AMPUTATION Bilateral 01/12/2022   Procedure: LEFT INDEX FINGER AMPUTATION, RIGHT INDEX, LONG AND SMALL DIGITS AMPUTATION;  Surgeon: Leanora Cover, MD;  Location: [place];  Service: Orthopedics;  Laterality: Bilateral;   AMPUTATION Right 04/01/2022   Procedure: RIGHT INDEX FINGER AMPUTATION AT METACARPAL PHALANGEAL JOINT, INCISION AND DRAINAGE RIGHT HAND;  Surgeon: Leanora Cover, MD;  Location: [place];  Service: Orthopedics;  Laterality: Right;  90 MIN   AMPUTATION Right 05/23/2022   Procedure: RIGHT INDEX FINGER RAY AMPUTATION; RIGHT  LONG, RING, AND SMALL FINGER AMPUTATIONS;  Surgeon: Leanora Cover, MD;  Location: [place];  Service: Orthopedics;  Laterality: Right;  90 MIN   AMPUTATION Left 08/07/2022   Procedure: LEFT ABOVE KNEE AMPUTATION;  Surgeon: [name], MD;  Location: [place];  Service: Orthopedics;  Laterality: Left;   AORTIC ARCH ANGIOGRAPHY N/A 01/07/2022   Procedure: AORTIC ARCH ANGIOGRAPHY;  Surgeon: Waynetta Sandy,  MD;  Location: [facility];  Service: Cardiovascular;  Laterality: N/A;   APPLICATION OF WOUND VAC Right 06/19/2018   Procedure: APPLICATION OF WOUND VAC;  Surgeon: [name], MD;  Location: [place];  Service: Orthopedics;  Laterality: Right;   APPLICATION OF WOUND VAC Left 08/07/2022   Procedure: APPLICATION OF WOUND VAC;  Surgeon: [name], MD;  Location: [place];  Service: Orthopedics;  Laterality: Left;   AV FISTULA PLACEMENT Left 11/25/2019   Procedure: LEFT ARM Brachiocephalic  ARTERIOVENOUS (AV) FISTULA CREATION;  Surgeon: Rosetta Posner, MD;  Location: [place];  Service: Vascular;  Laterality: Left;   [place] Left 07/19/2020   Procedure: LEFT SECOND STAGE [place];  Surgeon: Waynetta Sandy, MD;  Location: [place];  Service: Vascular;  Laterality: Left;   CIRCUMCISION  09/02/2011   Procedure: CIRCUMCISION ADULT;  Surgeon: Molli Hazard, MD;  Location: [place];  Service: Urology;  Laterality: N/A;   CORONARY STENT INTERVENTION N/A 07/29/2018   Procedure: CORONARY STENT INTERVENTION;  Surgeon: Leonie Man, MD;  Location: [facility];  Service: Cardiovascular;  Laterality: N/A;   DEBRIDEMENT AND CLOSURE WOUND Left 11/15/2021   Procedure: LEFT HAND WOUND CLOSURE;  Surgeon: Leanora Cover, MD;  Location: [place];  Service: Orthopedics;  Laterality: Left;   I & D EXTREMITY  09/02/2011   Procedure: IRRIGATION AND DEBRIDEMENT EXTREMITY;  Surgeon: Theodoro Kos, DO;  Location: [place];  Service: Plastics;  Laterality: N/A;   INCISION AND DRAINAGE OF WOUND  08/12/2011   Procedure: IRRIGATION AND DEBRIDEMENT WOUND;  Surgeon: Zenovia Jarred, MD;  Location: [place];  Service: General;;  perineum   INSERTION OF DIALYSIS CATHETER N/A 11/25/2019   Procedure: INSERTION OF Righ Internal Jugular DIALYSIS CATHETER;  Surgeon: Rosetta Posner, MD;  Location: [place];  Service: Vascular;  Laterality: N/A;    [place]  08/12/2011   Procedure: IRRIGATION AND DEBRIDEMENT ABSCESS;  Surgeon: Molli Hazard, MD;  Location: [place];  Service: Urology;  Laterality: N/A;  irrigation and debridment perineum     IRRIGATION AND DEBRIDEMENT ABSCESS  08/14/2011   Procedure: MINOR INCISION AND DRAINAGE OF ABSCESS;  Surgeon: Molli Hazard, MD;  Location: [place];  Service: Urology;  Laterality: N/A;  Perineal Wound Debridement;Placement Bilateral Testicular Thigh Pouches   LEFT HEART CATH AND CORONARY ANGIOGRAPHY N/A 07/29/2018   Procedure: LEFT HEART CATH AND CORONARY ANGIOGRAPHY;  Surgeon: Leonie Man, MD;  Location: [facility];  Service: Cardiovascular;  Laterality: N/A;   LOWER EXTREMITY ANGIOGRAPHY Bilateral 01/07/2022   Procedure: Lower Extremity Angiography;  Surgeon: Waynetta Sandy, MD;  Location: [facility];  Service: Cardiovascular;  Laterality: Bilateral;   PERIPHERAL VASCULAR ATHERECTOMY Left 04/09/2021   Procedure: PERIPHERAL VASCULAR ATHERECTOMY;  Surgeon: Waynetta Sandy, MD;  Location: [facility];  Service: Cardiovascular;  Laterality: Left;  TP trunk and peroneal arteries   PERIPHERAL VASCULAR BALLOON ANGIOPLASTY Left 04/09/2021   Procedure: PERIPHERAL VASCULAR BALLOON ANGIOPLASTY;  Surgeon:  Waynetta Sandy, MD;  Location: [facility];  Service: Cardiovascular;  Laterality: Left;  Popliteal   REVISON OF ARTERIOVENOUS FISTULA Left 05/24/2020   Procedure: LEFT ARM ARTERIOVENOUS FISTULA  CONVERSION TO BASILIC VEIN FISTULA;  Surgeon: Waynetta Sandy, MD;  Location: [place];  Service: Vascular;  Laterality: Left;   TOTAL HIP ARTHROPLASTY Left 10/11/2016   Procedure: LEFT TOTAL HIP ARTHROPLASTY ANTERIOR APPROACH;  Surgeon: Mcarthur Rossetti, MD;  Location: [facility];  Service: Orthopedics;  Laterality: Left;   UPPER EXTREMITY ANGIOGRAPHY Bilateral 01/07/2022   Procedure: Upper Extremity  Angiography;  Surgeon: Waynetta Sandy, MD;  Location: [facility];  Service: Cardiovascular;  Laterality: Bilateral;    Family History  Problem Relation Age of Onset   Diabetes Other    Pancreatic cancer Mother 33   Colon cancer Neg Hx    Colon polyps Neg Hx    Esophageal cancer Neg Hx    Rectal cancer Neg Hx    Stomach cancer Neg Hx    Social History:  reports that he quit smoking about 2 years ago. His smoking use included cigarettes. He has a 6.00 pack-year smoking history. He has never used smokeless tobacco. He reports that he does not currently use alcohol. He reports that he does not use drugs.  Allergies:  Allergies  Allergen Reactions   Crestor [Rosuvastatin] Rash    Eye redness   Dilaudid [Hydromorphone] Itching   Robaxin [Methocarbamol] Other (See Comments)    Pt states medication made his arm pain worse.   Benadryl [Diphenhydramine] Rash    No medications prior to admission.    No results found for this or any previous visit (from the past 48 hour(s)). No results found.  Review of Systems  All other systems reviewed and are negative.   Weight 74 kg. Physical Exam  Patient is alert, oriented, no adenopathy, well-dressed, normal affect, normal respiratory effort. Examination patient has complete dehiscence of the above-knee amputation.  There is necrotic draining tissue.  There is no cellulitis no gangrenous changes to the skin.  Patient states he was on an antibiotic but does not know what happened to the pills.  Assessment/Plan 1. H/O above knee amputation, left ([place])   2. Dehiscence of amputation stump of left lower extremity (HCC)       Plan: With the complete dehiscence of the residual limb will plan for revision above-knee amputation the left plan for dialysis Saturday and discharge to home when stable.  [name], MD 09/20/2022, 6:43 AM

## 2022-09-20 NOTE — Op Note (Signed)
09/20/2022  1:07 PM  PATIENT:  Edward [name]    PRE-OPERATIVE DIAGNOSIS:  DEHISCENCE LEFT ABOVE THE KNEE AMPUTATION  POST-OPERATIVE DIAGNOSIS:  Same  PROCEDURE:  LEFT ABOVE THE KNEE AMPUTATION REVISION Application Kerecis micro 38 cm and vancomycin powder 500 mg. Application of customizable wound VAC.  SURGEON:  [name], MD  PHYSICIAN ASSISTANT:None ANESTHESIA:   General  PREOPERATIVE INDICATIONS:  Edward [name] is a  52 y.o. male with a diagnosis of DEHISCENCE LEFT ABOVE THE KNEE AMPUTATION who failed conservative measures and elected for surgical management.    The risks benefits and alternatives were discussed with the patient preoperatively including but not limited to the risks of infection, bleeding, nerve injury, cardiopulmonary complications, the need for revision surgery, among others, and the patient was willing to proceed.  OPERATIVE IMPLANTS: Kerecis micro powder 38 cm on vancomycin 500 mg  '@ENCIMAGES'$ @  OPERATIVE FINDINGS: Tissue margins were clear tissue was sent for cultures.  OPERATIVE PROCEDURE: Patient brought the operating room and underwent a general anesthetic.  After adequate levels anesthesia were obtained patient's left lower extremity was prepped using DuraPrep draped into a sterile field a timeout was called.  A 21 blade knife was used to create a [place] incision proximal to the area of wound dehiscence and necrotic tissue this was carried down to bone and the distal 2 cm of femur was resected with a reciprocating saw.  The wound margins were clear.  Electrocautery was used hemostasis the wound was irrigated normal saline the femoral canal was filled with 500 mg vancomycin powder.  The soft tissue was covered with 38 cm Kerecis micro graft.  A #1 Vicryl was used to close the deep and superficial fascial layer of the skin was closed using staples.  The customizable wound VAC was applied this had a good suction fit patient was extubated taken the PACU  in stable condition.   DISCHARGE PLANNING:  Antibiotic duration: Vancomycin x 1 dose  Weightbearing: Transfer training  Pain medication: Opioid pathway  Dressing care/ Wound VAC: Wound VAC for 1 week  Ambulatory devices: Wheelchair  Discharge to: Anticipate discharge to home.  Follow-up: In the office 1 week post operative.

## 2022-09-20 NOTE — Plan of Care (Signed)
  Problem: Education: Goal: Knowledge of General Education information will improve Description: Including pain rating scale, medication(s)/side effects and non-pharmacologic comfort measures Outcome: Progressing   Problem: Clinical Measurements: Goal: Ability to maintain clinical measurements within normal limits will improve Outcome: Progressing Goal: Will remain free from infection Outcome: Progressing   Problem: Activity: Goal: Risk for activity intolerance will decrease Outcome: Progressing   Problem: Nutrition: Goal: Adequate nutrition will be maintained Outcome: Progressing   Problem: Elimination: Goal: Will not experience complications related to bowel motility Outcome: Progressing   Problem: Skin Integrity: Goal: Risk for impaired skin integrity will decrease Outcome: Progressing

## 2022-09-20 NOTE — Anesthesia Postprocedure Evaluation (Signed)
Anesthesia Post Note  Patient: Edward [name]  Procedure(s) Performed: LEFT ABOVE THE KNEE AMPUTATION REVISION (Left)     Patient location during evaluation: PACU Anesthesia Type: General Level of consciousness: awake and alert and oriented Pain management: pain level controlled Vital Signs Assessment: post-procedure vital signs reviewed and stable Respiratory status: spontaneous breathing, nonlabored ventilation and respiratory function stable Cardiovascular status: blood pressure returned to baseline and stable Postop Assessment: no apparent nausea or vomiting Anesthetic complications: no   No notable events documented.  Last Vitals:  Vitals:   09/20/22 1315 09/20/22 1330  BP: 102/78 104/79  Pulse: (!) 101 98  Resp: 12 16  Temp:    SpO2: 99% 90%    Last Pain:  Vitals:   09/20/22 1330  PainSc: Asleep                 Kaelan Emami A.

## 2022-09-20 NOTE — Progress Notes (Signed)
Patient's glucose was 57 at 2146 this evening.  He was asymptomatic.  Patient had 75 % of Glucerna drink.  His glucose came up to 75.  After that, he stated he was hungry and had half of a chicken salad sandwich.

## 2022-09-20 NOTE — Progress Notes (Signed)
PHARMACY NOTE:  ANTIMICROBIAL RENAL DOSAGE ADJUSTMENT  Current antimicrobial regimen includes a mismatch between antimicrobial dosage and estimated renal function.  As per policy approved by the Pharmacy & Therapeutics and Medical Executive Committees, the antimicrobial dosage will be adjusted accordingly.  Current antimicrobial dosage:  cefazolin 2g IV q8h x2  Indication: surgical ppx  Renal Function:  Estimated Creatinine Clearance: 8.6 mL/min (A) (by C-G formula based on SCr of 5.9 mg/dL (H)). '[x]'$      On intermittent HD, scheduled: '[]'$      On CRRT    Antimicrobial dosage has been changed to:  D/C'd ABX as pt received preop 2g dose ~1230 today which will cover x24h  Additional comments:   Thank you for allowing pharmacy to be a part of this patient's care.  Einar Grad, [facility] 09/20/2022 4:21 PM

## 2022-09-20 NOTE — Telephone Encounter (Signed)
Dialysis consult called for pt Dr. Carolin Sicks will arrange dialysis while in hospital.

## 2022-09-21 ENCOUNTER — Encounter (HOSPITAL_COMMUNITY): Payer: Self-pay | Admitting: Orthopedic Surgery

## 2022-09-21 LAB — GLUCOSE, CAPILLARY
Glucose-Capillary: 112 mg/dL — ABNORMAL HIGH (ref 70–99)
Glucose-Capillary: 128 mg/dL — ABNORMAL HIGH (ref 70–99)
Glucose-Capillary: 112 mg/dL — ABNORMAL HIGH (ref 70–99)
Glucose-Capillary: 141 mg/dL — ABNORMAL HIGH (ref 70–99)

## 2022-09-21 LAB — CBC
HCT: 33.8 % — ABNORMAL LOW (ref 39.0–52.0)
Hemoglobin: 10.5 g/dL — ABNORMAL LOW (ref 13.0–17.0)
MCH: 29 pg (ref 26.0–34.0)
MCHC: 31.1 g/dL (ref 30.0–36.0)
MCV: 93.4 fL (ref 80.0–100.0)
Platelets: 109 10*3/uL — ABNORMAL LOW (ref 150–400)
RBC: 3.62 MIL/uL — ABNORMAL LOW (ref 4.22–5.81)
RDW: 18.2 % — ABNORMAL HIGH (ref 11.5–15.5)
WBC: 7.6 10*3/uL (ref 4.0–10.5)
nRBC: 0 % (ref 0.0–0.2)

## 2022-09-21 LAB — BASIC METABOLIC PANEL
Anion gap: 16 — ABNORMAL HIGH (ref 5–15)
BUN: 39 mg/dL — ABNORMAL HIGH (ref 6–20)
CO2: 26 mmol/L (ref 22–32)
Calcium: 8.4 mg/dL — ABNORMAL LOW (ref 8.9–10.3)
Chloride: 96 mmol/L — ABNORMAL LOW (ref 98–111)
Creatinine, Ser: 6.5 mg/dL — ABNORMAL HIGH (ref 0.61–1.24)
GFR, Estimated: 10 mL/min — ABNORMAL LOW (ref >60)
Glucose, Bld: 128 mg/dL — ABNORMAL HIGH (ref 70–99)
Potassium: 3.2 mmol/L — ABNORMAL LOW (ref 3.5–5.1)
Sodium: 138 mmol/L (ref 135–145)

## 2022-09-21 LAB — HEPATITIS B SURFACE ANTIGEN: Hepatitis B Surface Ag: NONREACTIVE

## 2022-09-21 MED ORDER — LIDOCAINE HCL (PF) 1 % IJ SOLN: 5.0000 mL | INTRAMUSCULAR | Status: DC | PRN

## 2022-09-21 MED ORDER — CHLORHEXIDINE GLUCONATE CLOTH 2 % EX PADS
6.0000 | MEDICATED_PAD | Freq: Every day | CUTANEOUS | Status: DC
  Administered 2022-09-22: 6 via TOPICAL

## 2022-09-21 MED ORDER — ANTICOAGULANT SODIUM CITRATE 4% (200MG/5ML) IV SOLN
5.0000 mL | Status: DC | PRN
  Filled 2022-09-21: qty 5

## 2022-09-21 MED ORDER — LIDOCAINE-PRILOCAINE 2.5-2.5 % EX CREA: 1.0000 | TOPICAL_CREAM | CUTANEOUS | Status: DC | PRN

## 2022-09-21 MED ORDER — ALTEPLASE 2 MG IJ SOLR: 2.0000 mg | Freq: Once | INTRAMUSCULAR | Status: DC | PRN

## 2022-09-21 MED ORDER — HEPARIN SODIUM (PORCINE) 1000 UNIT/ML DIALYSIS: 1000.0000 [IU] | INTRAMUSCULAR | Status: DC | PRN

## 2022-09-21 MED ORDER — HEPARIN SODIUM (PORCINE) 1000 UNIT/ML IJ SOLN
INTRAMUSCULAR | Status: AC
  Filled 2022-09-21: qty 4

## 2022-09-21 MED ORDER — PENTAFLUOROPROP-TETRAFLUOROETH EX AERO: 1.0000 | INHALATION_SPRAY | CUTANEOUS | Status: DC | PRN

## 2022-09-21 NOTE — Progress Notes (Signed)
Patient has been very irritable this evening.  He has been pulling off clothing and continuous pulse ox.  He has been yelling at staff to stop bothering him so he can sleep.  He complained because we had to recheck his vitals and reposition him because his head was resting on the side railing.

## 2022-09-21 NOTE — Progress Notes (Signed)
PT Cancellation Note  Patient Details Name: Edward [name] MRN: [id number] DOB: 25-Feb-1971   Cancelled Treatment:    Reason Eval/Treat Not Completed: Patient at procedure or test/unavailable (Attempted to see pt and pt was being taken down to HD. Will continue to attempt as able and appropriate)   Ander Purpura 09/21/2022, 1:14 PM

## 2022-09-21 NOTE — Progress Notes (Signed)
Patient ID: Edward [name], male   DOB: 05-Feb-1971, 52 y.o.   MRN: [id number] Patient is postoperative day 1 revision left above-the-knee amputation.  There is no drainage in the wound VAC canister.  Plan for dialysis today and anticipate discharge to home on Monday.

## 2022-09-21 NOTE — Consult Note (Addendum)
[facility] Nephrology Consult Note: Reason for Consult: To manage dialysis and dialysis related needs Referring Physician: Dr. Meridee Score  HPI:  Edward [name] is an 52 y.o. male with history of hypertension, type II DM, peripheral vascular disease with multiple amputations surgeries, anemia, secondary hyperparathyroidism, ESRD on HD TTS who underwent left above-knee amputation revision on 2/2, seen as a consultation to manage ESRD.  Apparently patient has had multiple surgeries for peripheral vascular disease including fingers, right AKA.  He was admitted from orthopedics office for the surgery. He is on room air and blood pressure seems to be acceptable.  The labs showed potassium 3.2, BUN 39, calcium 8.4, hemoglobin of 10.5. He denies headache, dizziness, nausea, vomiting, chest pain, shortness of breath. Today is his regular dialysis day. OP HD orders: [place], TTS, 4.5 hrs, EDW 73 kg, 2k  2 ca, BFR 425/700, RIJ TDC for access. Mircera 100 mcg on 1/30 Hectorol 2 mcg    Past Medical History:  Diagnosis Date   Allergy    Anemia    getting Venofer with dialysis   Anxiety    self reported, sometimes-situational anxiety   Arthritis    bilateral hands/LEFT hip   CHF (congestive heart failure) (HCC)    Chronic kidney disease    t th s dialysis- [place] ---ESRD   Coronary artery disease    Depression    self reported, sometimes-situational depression   Diabetes mellitus    type II- on meds   Dyspnea    sometimes- fluid   GERD (gastroesophageal reflux disease)    on meds   History of blood transfusion    Hypertension    no longer on medication since starting on Hemodialysis   Myocardial infarction ([place]) 2019   Nausea and vomiting    Peripheral vascular disease ([place])    Sleep apnea    does not use CPAP    Past Surgical History:  Procedure Laterality Date   A/V FISTULAGRAM Left 03/06/2020   Procedure: A/V FISTULAGRAM;  Surgeon: Waynetta Sandy, MD;   Location: [place] [facility];  Service: Cardiovascular;  Laterality: Left;   ABDOMINAL AORTOGRAM N/A 01/07/2022   Procedure: ABDOMINAL AORTOGRAM;  Surgeon: Waynetta Sandy, MD;  Location: [place] [facility];  Service: Cardiovascular;  Laterality: N/A;   ABDOMINAL AORTOGRAM W/LOWER EXTREMITY N/A 04/09/2021   Procedure: ABDOMINAL AORTOGRAM W/LOWER EXTREMITY;  Surgeon: Waynetta Sandy, MD;  Location: [place] [facility];  Service: Cardiovascular;  Laterality: N/A;   AMPUTATION Left 05/10/2016   Procedure: Left Transmetatarsal Amputation;  Surgeon: [name], MD;  Location: [place];  Service: Orthopedics;  Laterality: Left;   AMPUTATION Right 05/16/2018   Procedure: PARTIAL RAY AMPUTATION FIFTH TOE RIGHT FOOT;  Surgeon: [name], DPM;  Location: [place];  Service: Podiatry;  Laterality: Right;   AMPUTATION Right 06/17/2018   Procedure: AMPUTATION 4th RAY Right Foot;  Surgeon: Trula Slade, DPM;  Location: [place];  Service: Podiatry;  Laterality: Right;   AMPUTATION Right 06/19/2018   Procedure: RIGHT BELOW KNEE AMPUTATION;  Surgeon: [name], MD;  Location: [place];  Service: Orthopedics;  Laterality: Right;   AMPUTATION Left 10/05/2021   Procedure: LEFT RING FINGER AMPUTATION;  Surgeon: Daryll Brod, MD;  Location: [place];  Service: Orthopedics;  Laterality: Left;  45 MIN   AMPUTATION Left 11/15/2021   Procedure: LEFT INDEX FINGER AMPUTATION;  Surgeon: Leanora Cover, MD;  Location: [place];  Service: Orthopedics;  Laterality: Left;  [place] Right 01/08/2022   Procedure: AMPUTATION ABOVE KNEE;  Surgeon: Waynetta Sandy, MD;  Location: [place];  Service: Vascular;  Laterality: Right;   AMPUTATION Bilateral 01/12/2022   Procedure: LEFT INDEX FINGER AMPUTATION, RIGHT INDEX, LONG AND SMALL DIGITS AMPUTATION;  Surgeon: Leanora Cover, MD;  Location: [place];  Service: Orthopedics;  Laterality: Bilateral;   AMPUTATION Right 04/01/2022   Procedure: RIGHT INDEX  FINGER AMPUTATION AT METACARPAL PHALANGEAL JOINT, INCISION AND DRAINAGE RIGHT HAND;  Surgeon: Leanora Cover, MD;  Location: [place];  Service: Orthopedics;  Laterality: Right;  90 MIN   AMPUTATION Right 05/23/2022   Procedure: RIGHT INDEX FINGER RAY AMPUTATION; RIGHT LONG, RING, AND SMALL FINGER AMPUTATIONS;  Surgeon: Leanora Cover, MD;  Location: [place];  Service: Orthopedics;  Laterality: Right;  90 MIN   AMPUTATION Left 08/07/2022   Procedure: LEFT ABOVE KNEE AMPUTATION;  Surgeon: [name], MD;  Location: [place];  Service: Orthopedics;  Laterality: Left;   AORTIC ARCH ANGIOGRAPHY N/A 01/07/2022   Procedure: AORTIC ARCH ANGIOGRAPHY;  Surgeon: Waynetta Sandy, MD;  Location: [place] [facility];  Service: Cardiovascular;  Laterality: N/A;   APPLICATION OF WOUND VAC Right 06/19/2018   Procedure: APPLICATION OF WOUND VAC;  Surgeon: [name], MD;  Location: [place];  Service: Orthopedics;  Laterality: Right;   APPLICATION OF WOUND VAC Left 08/07/2022   Procedure: APPLICATION OF WOUND VAC;  Surgeon: [name], MD;  Location: [place];  Service: Orthopedics;  Laterality: Left;   AV FISTULA PLACEMENT Left 11/25/2019   Procedure: LEFT ARM Brachiocephalic  ARTERIOVENOUS (AV) FISTULA CREATION;  Surgeon: Rosetta Posner, MD;  Location: [place];  Service: Vascular;  Laterality: Left;   [place] Left 07/19/2020   Procedure: LEFT SECOND STAGE [place];  Surgeon: Waynetta Sandy, MD;  Location: [place];  Service: Vascular;  Laterality: Left;   CIRCUMCISION  09/02/2011   Procedure: CIRCUMCISION ADULT;  Surgeon: Molli Hazard, MD;  Location: [place];  Service: Urology;  Laterality: N/A;   CORONARY STENT INTERVENTION N/A 07/29/2018   Procedure: CORONARY STENT INTERVENTION;  Surgeon: Leonie Man, MD;  Location: [place] [facility];  Service: Cardiovascular;  Laterality: N/A;   DEBRIDEMENT AND CLOSURE WOUND Left 11/15/2021    Procedure: LEFT HAND WOUND CLOSURE;  Surgeon: Leanora Cover, MD;  Location: [place];  Service: Orthopedics;  Laterality: Left;   I & D EXTREMITY  09/02/2011   Procedure: IRRIGATION AND DEBRIDEMENT EXTREMITY;  Surgeon: Theodoro Kos, DO;  Location: [place];  Service: Plastics;  Laterality: N/A;   INCISION AND DRAINAGE OF WOUND  08/12/2011   Procedure: IRRIGATION AND DEBRIDEMENT WOUND;  Surgeon: Zenovia Jarred, MD;  Location: [place];  Service: General;;  perineum   INSERTION OF DIALYSIS CATHETER N/A 11/25/2019   Procedure: INSERTION OF Righ Internal Jugular DIALYSIS CATHETER;  Surgeon: Rosetta Posner, MD;  Location: [place];  Service: Vascular;  Laterality: N/A;   [place]  08/12/2011   Procedure: IRRIGATION AND DEBRIDEMENT ABSCESS;  Surgeon: Molli Hazard, MD;  Location: [place];  Service: Urology;  Laterality: N/A;  irrigation and debridment perineum     IRRIGATION AND DEBRIDEMENT ABSCESS  08/14/2011   Procedure: MINOR INCISION AND DRAINAGE OF ABSCESS;  Surgeon: Molli Hazard, MD;  Location: [place];  Service: Urology;  Laterality: N/A;  Perineal Wound Debridement;Placement Bilateral Testicular Thigh Pouches   LEFT HEART CATH AND  CORONARY ANGIOGRAPHY N/A 07/29/2018   Procedure: LEFT HEART CATH AND CORONARY ANGIOGRAPHY;  Surgeon: Leonie Man, MD;  Location: [place] [facility] [facility];  Service: Cardiovascular;  Laterality: N/A;   LOWER EXTREMITY ANGIOGRAPHY Bilateral 01/07/2022   Procedure: Lower Extremity Angiography;  Surgeon: Waynetta Sandy, MD;  Location: [place] [facility];  Service: Cardiovascular;  Laterality: Bilateral;   PERIPHERAL VASCULAR ATHERECTOMY Left 04/09/2021   Procedure: PERIPHERAL VASCULAR ATHERECTOMY;  Surgeon: Waynetta Sandy, MD;  Location: [place] [facility];  Service: Cardiovascular;  Laterality: Left;  TP trunk and peroneal arteries   PERIPHERAL VASCULAR BALLOON ANGIOPLASTY Left 04/09/2021   Procedure:  PERIPHERAL VASCULAR BALLOON ANGIOPLASTY;  Surgeon: Waynetta Sandy, MD;  Location: [place] [facility];  Service: Cardiovascular;  Laterality: Left;  Popliteal   REVISON OF ARTERIOVENOUS FISTULA Left 05/24/2020   Procedure: LEFT ARM ARTERIOVENOUS FISTULA  CONVERSION TO BASILIC VEIN FISTULA;  Surgeon: Waynetta Sandy, MD;  Location: [place];  Service: Vascular;  Laterality: Left;   TOTAL HIP ARTHROPLASTY Left 10/11/2016   Procedure: LEFT TOTAL HIP ARTHROPLASTY ANTERIOR APPROACH;  Surgeon: Mcarthur Rossetti, MD;  Location: [facility];  Service: Orthopedics;  Laterality: Left;   UPPER EXTREMITY ANGIOGRAPHY Bilateral 01/07/2022   Procedure: Upper Extremity Angiography;  Surgeon: Waynetta Sandy, MD;  Location: [place] [facility];  Service: Cardiovascular;  Laterality: Bilateral;    Family History  Problem Relation Age of Onset   Diabetes Other    Pancreatic cancer Mother 4   Colon cancer Neg Hx    Colon polyps Neg Hx    Esophageal cancer Neg Hx    Rectal cancer Neg Hx    Stomach cancer Neg Hx     Social History:  reports that he quit smoking about 2 years ago. His smoking use included cigarettes. He has a 6.00 pack-year smoking history. He has never used smokeless tobacco. He reports that he does not currently use alcohol. He reports that he does not use drugs.  Allergies:  Allergies  Allergen Reactions   Crestor [Rosuvastatin] Rash    Eye redness   Dilaudid [Hydromorphone] Itching   Robaxin [Methocarbamol] Other (See Comments)    Pt states medication made his arm pain worse.   Benadryl [Diphenhydramine] Rash    Medications: I have reviewed the patient's current medications.   Results for orders placed or performed during the hospital encounter of 09/20/22 (from the past 48 hour(s))  I-STAT, chem 8     Status: Abnormal   Collection Time: 09/20/22  9:52 AM  Result Value Ref Range   Sodium 136 135 - 145 mmol/L   Potassium 3.5 3.5 - 5.1 mmol/L    Chloride 97 (L) 98 - 111 mmol/L   BUN 32 (H) 6 - 20 mg/dL   Creatinine, Ser 5.90 (H) 0.61 - 1.24 mg/dL   Glucose, Bld 214 (H) 70 - 99 mg/dL    Comment: Glucose reference range applies only to samples taken after fasting for at least 8 hours.   Calcium, Ion 0.90 (L) 1.15 - 1.40 mmol/L   TCO2 27 22 - 32 mmol/L   Hemoglobin 13.9 13.0 - 17.0 g/dL   HCT 41.0 39.0 - 52.0 %  Glucose, capillary     Status: Abnormal   Collection Time: 09/20/22 11:47 AM  Result Value Ref Range   Glucose-Capillary 210 (H) 70 - 99 mg/dL    Comment: Glucose reference range applies only to samples taken after fasting for at least 8 hours.  Aerobic/Anaerobic Culture  w Gram Stain (surgical/deep wound)     Status: None (Preliminary result)   Collection Time: 09/20/22 12:43 PM   Specimen: Abscess; Tissue  Result Value Ref Range   Specimen Description ABSCESS    Special Requests NONE    Gram Stain      NO WBC SEEN NO ORGANISMS SEEN Performed at [facility], [place] [address], [place], [place] [address]    Culture PENDING    Report Status PENDING   Glucose, capillary     Status: Abnormal   Collection Time: 09/20/22  1:57 PM  Result Value Ref Range   Glucose-Capillary 159 (H) 70 - 99 mg/dL    Comment: Glucose reference range applies only to samples taken after fasting for at least 8 hours.  Glucose, capillary     Status: Abnormal   Collection Time: 09/20/22  5:46 PM  Result Value Ref Range   Glucose-Capillary 143 (H) 70 - 99 mg/dL    Comment: Glucose reference range applies only to samples taken after fasting for at least 8 hours.  Glucose, capillary     Status: Abnormal   Collection Time: 09/20/22  9:43 PM  Result Value Ref Range   Glucose-Capillary 57 (L) 70 - 99 mg/dL    Comment: Glucose reference range applies only to samples taken after fasting for at least 8 hours.  Glucose, capillary     Status: None   Collection Time: 09/20/22 10:39 PM  Result Value Ref Range   Glucose-Capillary 75 70 - 99 mg/dL     Comment: Glucose reference range applies only to samples taken after fasting for at least 8 hours.  Basic metabolic panel     Status: Abnormal   Collection Time: 09/21/22  2:45 AM  Result Value Ref Range   Sodium 138 135 - 145 mmol/L   Potassium 3.2 (L) 3.5 - 5.1 mmol/L   Chloride 96 (L) 98 - 111 mmol/L   CO2 26 22 - 32 mmol/L   Glucose, Bld 128 (H) 70 - 99 mg/dL    Comment: Glucose reference range applies only to samples taken after fasting for at least 8 hours.   BUN 39 (H) 6 - 20 mg/dL   Creatinine, Ser 6.50 (H) 0.61 - 1.24 mg/dL   Calcium 8.4 (L) 8.9 - 10.3 mg/dL   GFR, Estimated 10 (L) >60 mL/min    Comment: (NOTE) Calculated using the CKD-EPI Creatinine Equation (2021)    Anion gap 16 (H) 5 - 15    Comment: Performed at [facility] [address], [place], [place] [address]  CBC     Status: Abnormal   Collection Time: 09/21/22  2:45 AM  Result Value Ref Range   WBC 7.6 4.0 - 10.5 K/uL   RBC 3.62 (L) 4.22 - 5.81 MIL/uL   Hemoglobin 10.5 (L) 13.0 - 17.0 g/dL   HCT 33.8 (L) 39.0 - 52.0 %   MCV 93.4 80.0 - 100.0 fL   MCH 29.0 26.0 - 34.0 pg   MCHC 31.1 30.0 - 36.0 g/dL   RDW 18.2 (H) 11.5 - 15.5 %   Platelets 109 (L) 150 - 400 K/uL   nRBC 0.0 0.0 - 0.2 %    Comment: Performed at [facility], [place] [address] [place], [place] [address]  Glucose, capillary     Status: Abnormal   Collection Time: 09/21/22  7:22 AM  Result Value Ref Range   Glucose-Capillary 112 (H) 70 - 99 mg/dL    Comment:  Glucose reference range applies only to samples taken after fasting for at least 8 hours.    No results found.  ROS: As per H&P, rest of the systems reviewed and negative. Blood pressure 105/75, pulse (!) 102, temperature 98.3 F (36.8 C), temperature source Oral, resp. rate 18, weight 73 kg, SpO2 90 %. Gen: NAD, comfortable Respiratory: Clear bilateral, no wheezing or crackle Cardiovascular: Regular rate rhythm S1-S2 normal, no rubs GI: Abdomen soft, nontender,  nondistended Extremities,: Bilateral AKA, multiple finger amputation.  No stump edema  Skin: No rash or ulcer Neurology: Alert, awake, following commands, oriented Dialysis Access: Right IJ TDC in place.  Assessment/Plan:  # Peripheral vascular disease with a dehiscence of left AKA: Status post revision surgery by Dr. Sharol Given on 2/2.  Wound care, pain management per primary team.  # ESRD: TTS.  We will plan for regular dialysis today.  Right IJ for the access.  # Hypertension/volume: Euvolemic on exam.  UF as tolerated.  # Anemia of ESRD: Hemoglobin at goal.  Recently received Mircera as outpatient.  # Metabolic Bone Disease: On Velphoro.  Monitor lab.  # Hypokalemia: We will manage with dialysis, increase potassium bath.  Thank you for the consult.  Discussed with Dr. Sharol Given.  Petro Talent Tanna Furry 09/21/2022, 10:47 AM

## 2022-09-21 NOTE — Progress Notes (Signed)
Received patient in bed to unit.  Alert and oriented.  Informed consent signed and in chart.   [place] duration:3h 32m Patient tolerated well.  Transported back to the room  Alert, without acute distress.  Hand-off given to patient's nurse.   Access used:Catheter Access issues: none  Total UF removed: 1.4L Medication(s) given: None Post HD VS: 102/63,101,20,100% Post HD weight: 49.2kg   [facility]

## 2022-09-22 LAB — CBC
HCT: 34.6 % — ABNORMAL LOW (ref 39.0–52.0)
Hemoglobin: 10.4 g/dL — ABNORMAL LOW (ref 13.0–17.0)
MCH: 28.8 pg (ref 26.0–34.0)
MCHC: 30.1 g/dL (ref 30.0–36.0)
MCV: 95.8 fL (ref 80.0–100.0)
Platelets: 112 10*3/uL — ABNORMAL LOW (ref 150–400)
RBC: 3.61 MIL/uL — ABNORMAL LOW (ref 4.22–5.81)
RDW: 18.4 % — ABNORMAL HIGH (ref 11.5–15.5)
WBC: 9.4 10*3/uL (ref 4.0–10.5)
nRBC: 0 % (ref 0.0–0.2)

## 2022-09-22 LAB — BASIC METABOLIC PANEL
Anion gap: 15 (ref 5–15)
BUN: 28 mg/dL — ABNORMAL HIGH (ref 6–20)
CO2: 24 mmol/L (ref 22–32)
Calcium: 8.2 mg/dL — ABNORMAL LOW (ref 8.9–10.3)
Chloride: 95 mmol/L — ABNORMAL LOW (ref 98–111)
Creatinine, Ser: 4.54 mg/dL — ABNORMAL HIGH (ref 0.61–1.24)
GFR, Estimated: 15 mL/min — ABNORMAL LOW (ref >60)
Glucose, Bld: 137 mg/dL — ABNORMAL HIGH (ref 70–99)
Potassium: 4 mmol/L (ref 3.5–5.1)
Sodium: 134 mmol/L — ABNORMAL LOW (ref 135–145)

## 2022-09-22 LAB — GLUCOSE, CAPILLARY
Glucose-Capillary: 154 mg/dL — ABNORMAL HIGH (ref 70–99)
Glucose-Capillary: 137 mg/dL — ABNORMAL HIGH (ref 70–99)
Glucose-Capillary: 86 mg/dL (ref 70–99)
Glucose-Capillary: 128 mg/dL — ABNORMAL HIGH (ref 70–99)

## 2022-09-22 LAB — HEPATITIS B SURFACE ANTIBODY, QUANTITATIVE: Hep B S AB Quant (Post): 21.9 m[IU]/mL (ref >9.9)

## 2022-09-22 MED ORDER — LINEZOLID 600 MG PO TABS
600.0000 mg | ORAL_TABLET | Freq: Two times a day (BID) | ORAL | Status: DC
  Administered 2022-09-22 – 2022-09-23 (×2): 600 mg via ORAL
  Filled 2022-09-22 (×2): qty 1

## 2022-09-22 NOTE — Progress Notes (Signed)
Patient is a 52 year old male who presents s/p left AKA on 09/20/2022.  He states that he is doing well with no complaints.  He has suction intact to the wound VAC sponge with about 5 to 10 cc of sanguinous drainage in the wound VAC canister itself.  Vital signs are stable and labs look good.  He had dialysis yesterday.  Plan for likely discharge home tomorrow.

## 2022-09-22 NOTE — Progress Notes (Signed)
[facility] NEPHROLOGY PROGRESS NOTE  Assessment/ Plan: Pt is a 52 y.o. yo male  with history of hypertension, type II DM, peripheral vascular disease with multiple amputations surgeries, anemia, secondary hyperparathyroidism, ESRD on HD TTS who underwent left above-knee amputation revision on 2/2, seen as a consultation to manage ESRD.  OP HD orders: [place], TTS, 4.5 hrs, EDW 73 kg, 2k  2 ca, BFR 425/700, RIJ TDC for access. Mircera 100 mcg on 1/30 Hectorol 2 mcg   # Peripheral vascular disease with a dehiscence of left AKA: Status post revision surgery by Dr. Sharol Given on 2/2.  Wound care, pain management per primary team.   # ESRD: TTS.  Received HD on 2/3 with 1.4 L ultrafiltration, tolerated well.  Next HD on 1/6.  Right IJ for the access.   # Hypertension/volume: Euvolemic on exam.  UF as tolerated.   # Anemia of ESRD: Hemoglobin at goal.  Recently received Mircera as outpatient.   # Metabolic Bone Disease: On Velphoro.  Monitor lab.   # Hypokalemia: Improved with HD.  Subjective: Seen and examined at bedside.  Received dialysis yesterday and tolerated well.  No new event.  Denies any complaint. Objective Vital signs in last 24 hours: Vitals:   09/21/22 2015 09/21/22 2057 09/22/22 0413 09/22/22 0746  BP: 92/62 (!) '79/68 94/61 91/65 '$  Pulse: (!) 102 93 88 98  Resp: '20 19 18   '$ Temp: 98 F (36.7 C) 97.9 F (36.6 C)  98.7 F (37.1 C)  TempSrc:    Oral  SpO2:  100%    Weight:       Weight change:   Intake/Output Summary (Last 24 hours) at 09/22/2022 0959 Last data filed at 09/22/2022 0408 Gross per 24 hour  Intake --  Output 1405 ml  Net -1405 ml       Labs: RENAL PANEL Recent Labs    10/05/21 2356 10/05/21 2356 10/06/21 0702 11/15/21 1531 01/08/22 0543 01/08/22 0643 01/09/22 0309 01/10/22 0639 01/11/22 0239 01/11/22 1755 01/12/22 0041 01/13/22 0055 01/14/22 0029 04/01/22 1254 04/02/22 0205 04/03/22 0146 04/04/22 0106 04/05/22 0129  05/10/22 1023 05/11/22 0134 05/23/22 1249 08/05/22 1240 08/05/22 1248 08/06/22 0344 08/07/22 0321 08/08/22 0435 08/08/22 1401 08/09/22 0315 08/09/22 1030 08/10/22 0240 08/13/22 0919 08/19/22 0655 08/22/22 0544 09/20/22 0952 09/21/22 0245 09/22/22 0632  NA 134*  --  132*   < >  --    < > 134* 135 136 135 132*   < > 133*   < > 134* 135 135 136   < > 134*   < > 139   < > 133* 136 135  --  137  --  135 138 136 135 136 138 134*  K 4.8  --  5.1   < >  --    < > 4.2 4.0 2.9* 3.9 3.8   < > 4.4   < > 4.5 4.3 4.2 4.2   < > 4.2   < > 7.0*   < > 3.7 3.8 4.0  --  4.5  --  4.5 4.5 4.0 3.8 3.5 3.2* 4.0  CL 91*  --  91*   < >  --    < > 96* 95* 97* 95* 95*   < > 94*   < > 92* 93* 92* 93*   < > 92*   < > 92*   < > 94* 96* 95*  --  97*  --  95* 96* 93* 92* 97* 96* 95*  CO2 20*  --  17*   < >  --    < > '24 22 27 25 24   '$ < > 26  --  20* '24 23 26   '$ < > 24  --  12*  --  '22 22 24  '$ --  22  --  21* 20* 21* 22  --  26 24  GLUCOSE 169*  --  142*   < >  --    < > 122* 88 94 135* 153*   < > 297*   < > 106* 80 105* 97   < > 127*   < > 105*   < > 148* 157* 195*  --  156*  --  191* 145* 163* 141* 214* 128* 137*  BUN 44*  --  54*   < >  --    < > 26* 39* 14 23* 28*   < > 45*   < > 77* 45* 63* 38*   < > 40*   < > 71*   < > 36* 30* 48*  --  30*  --  62* 78* 55* 55* 32* 39* 28*  CREATININE 7.62*  --  8.49*   < >  --    < > 6.00* 8.17* 3.31* 5.19* 5.81*   < > 6.62*   < > 11.87* 7.93* 9.95* 7.32*   < > 6.86*   < > 8.84*   < > 4.83* 4.37* 6.44*  --  4.72*  --  6.29* 7.96* 6.74* 7.08* 5.90* 6.50* 4.54*  CALCIUM 9.2  --  8.9   < >  --    < > 8.6* 8.8* 8.6* 9.1 8.6*   < > 9.9  --  8.8* 9.3 9.2 9.1   < > 9.0  --  10.2  --  8.6* 8.6* 8.4*  --  8.7*  --  8.7* 8.7* 8.3* 8.5*  --  8.4* 8.2*  MG 2.2  --   --   --   --   --  2.0 2.1 2.1  --  2.0  --   --   --  2.2 2.0 2.1 1.9  --   --   --  2.8*  --   --   --   --   --   --   --   --   --   --   --   --   --   --   PHOS  --   --  9.2*  --  9.1*  --   --   --   --  3.2  --   --  4.3  --    --   --   --   --   --   --   --   --   --   --   --   --   --   --  4.9*  --  5.7* 7.1* 4.2  --   --   --   ALBUMIN  --    < > 2.7*  --  2.9*  --  2.5* 2.5* 2.5* 2.7* 2.4*   < > 2.5*  --  2.5* 2.9* 3.0* 2.8*   < > 3.2*  --  3.0*  --  2.7* 2.4*  --  2.3* 2.5*  --  2.6* 2.5* 2.5* 2.5*  --   --   --    < > = values in this interval not displayed.  Liver Function Tests: No results for input(s): "AST", "ALT", "ALKPHOS", "BILITOT", "PROT", "ALBUMIN" in the last 168 hours. No results for input(s): "LIPASE", "AMYLASE" in the last 168 hours. No results for input(s): "AMMONIA" in the last 168 hours. CBC: Recent Labs    05/11/22 0134 05/23/22 1249 08/19/22 0655 08/22/22 0544 09/20/22 0952 09/21/22 0245 09/22/22 0632  HGB 9.4*   < > 10.7* 10.9* 13.9 10.5* 10.4*  MCV 96.2   < > 92.7 92.0  --  93.4 95.8  VITAMINB12 1,181*  --   --   --   --   --   --    < > = values in this interval not displayed.    Cardiac Enzymes: No results for input(s): "CKTOTAL", "CKMB", "CKMBINDEX", "TROPONINI" in the last 168 hours. CBG: Recent Labs  Lab 09/21/22 0722 09/21/22 1121 09/21/22 1835 09/21/22 2158 09/22/22 0757  GLUCAP 112* 128* 112* 141* 154*    Iron Studies: No results for input(s): "IRON", "TIBC", "TRANSFERRIN", "FERRITIN" in the last 72 hours. Studies/Results: No results found.  Medications: Infusions:  sodium chloride 75 mL/hr at 09/20/22 1739   magnesium sulfate bolus IVPB      Scheduled Medications:  allopurinol  100 mg Oral BID   ALPRAZolam  0.25 mg Oral Daily   vitamin C  1,000 mg Oral Daily   aspirin EC  81 mg Oral Daily   atorvastatin  80 mg Oral Daily   Chlorhexidine Gluconate Cloth  6 each Topical Q0600   clopidogrel  75 mg Oral Daily   docusate sodium  100 mg Oral Daily   gabapentin  100 mg Oral TID   insulin aspart  0-9 Units Subcutaneous TID WC   insulin aspart  2 Units Subcutaneous TID WC   linaclotide  72 mcg Oral q AM   linagliptin  5 mg Oral Daily   midodrine   10 mg Oral BID   nutrition supplement (JUVEN)  1 packet Oral BID BM   pantoprazole  40 mg Oral Daily   sucroferric oxyhydroxide  1,000 mg Oral TID WC   zinc sulfate  220 mg Oral Daily    have reviewed scheduled and prn medications.  Physical Exam: General:NAD, comfortable Heart:RRR, s1s2 nl Lungs:clear b/l, no crackle Abdomen:soft, Non-tender, non-distended Extremities: Bilateral AKA, multiple finger amputation Dialysis Access: Right IJ TDC in place  [place] Prasad Cloyce Blankenhorn 09/22/2022,9:59 AM  LOS: 2 days

## 2022-09-22 NOTE — Progress Notes (Signed)
Patient not eating much. Poor po intake. Does not want to be fed. He is able to feed himself.Had a few bites for breakfast, nothing for lunch and one spoonful for dinner. Evening blood sugar 86. Assisted to drink ensure but could not finish.

## 2022-09-22 NOTE — Progress Notes (Signed)
Cultures growing VRE.  Ordered Zyvox '600mg'$  BID per pharmacy suggestion

## 2022-09-22 NOTE — Evaluation (Signed)
Physical Therapy Evaluation Patient Details Name: Edward [name] MRN: [id number] DOB: 1970-09-07 Today's Date: 09/22/2022  History of Present Illness  Pt is a 52 y.o. male s/p L AKA revision 09/21/22. He originally underwent L AKA 08/07/22 and d/c'd to [facility]. He returned home from [facility] 08/22/22. PMH: ESRD on HD, PVD with multiple amputations, including fingers and R AKA, DM, CHF, orthostatic hypotension, recurrent hyperkalemia   Clinical Impression  Pt admitted with above diagnosis. PTA pt lived at home with his sons, +2 assist for A/P transfers to/from manual w/c vs power w/c. Pt currently with functional limitations due to the deficits listed below (see PT Problem List). On eval, pt required min assist to pull forward to long sit in bed using bilat lower bed rails. Pt declined transfer due to pain. Pt will benefit from skilled PT to increase their independence and safety with mobility to allow discharge to the venue listed below.  Pt with recent d/c from [facility]. He has all needed DME, including hospital bed, BSC, manual w/c, and power w/c.         Recommendations for follow up therapy are one component of a multi-disciplinary discharge planning process, led by the attending physician.  Recommendations may be updated based on patient status, additional functional criteria and insurance authorization.  Follow Up Recommendations Home health PT      Assistance Recommended at Discharge Frequent or constant Supervision/Assistance  Patient can return home with the following  Two people to help with walking and/or transfers;A lot of help with bathing/dressing/bathroom;Assistance with cooking/housework;Assist for transportation;Help with stairs or ramp for entrance    Equipment Recommendations None recommended by PT  Recommendations for Other Services       Functional Status Assessment Patient has had a recent decline in their functional status and demonstrates the ability to make significant improvements  in function in a reasonable and predictable amount of time.     Precautions / Restrictions Precautions Precautions: Fall;Other (comment) Precaution Comments: wound vac L residual limb Restrictions Weight Bearing Restrictions: Yes LLE Weight Bearing: Non weight bearing      Mobility  Bed Mobility Overal bed mobility: Needs Assistance             General bed mobility comments: pull forward to long sit with min assist using bilat lower bed rails. Maintaining L sidebend at trunk, resting L elbow on rail. Pt declining transition to EOB due to pain    Transfers                   General transfer comment: Pt declined due to pain.    Ambulation/Gait               General Gait Details: nonambulatory at baseline  Stairs            Wheelchair Mobility    Modified Rankin (Stroke Patients Only)       Balance Overall balance assessment: Needs assistance Sitting-balance support: Bilateral upper extremity supported Sitting balance-Leahy Scale: Poor Sitting balance - Comments: reliant on bedrail support                                     Pertinent Vitals/Pain Pain Assessment Pain Assessment: Faces Faces Pain Scale: Hurts whole lot Pain Location: L residual limb Pain Descriptors / Indicators: Discomfort, Guarding, Grimacing Pain Intervention(s): RN gave pain meds during session, Limited activity within patient's tolerance, Repositioned  Home Living Family/patient expects to be discharged to:: Private residence Living Arrangements: Children (sons) Available Help at Discharge: Family;Available 24 hours/day Type of Home: House Home Access: Stairs to enter Entrance Stairs-Rails: None Entrance Stairs-Number of Steps: 1 to get onto porch and 1 to get into house   Home Layout: One level Home Equipment: Conservation officer, nature (2 wheels);Wheelchair - manual;Shower seat;BSC/3in1;Cane - single point;Hand held shower head;Tub bench;Wheelchair -  power;Hospital bed Additional Comments: Has prosthetic for RLE but does not wear it.    Prior Function Prior Level of Function : Needs assist       Physical Assist : Mobility (physical);ADLs (physical) Mobility (physical): Transfers ADLs (physical): Dressing;Bathing;Toileting;Grooming Mobility Comments: A/P transfers to/from power w/c vs manual w/c ADLs Comments: sons assist with all ADLs     Hand Dominance   Dominant Hand: Right    Extremity/Trunk Assessment   Upper Extremity Assessment Upper Extremity Assessment: RUE deficits/detail;LUE deficits/detail RUE Deficits / Details: multi amputated fingers LUE Deficits / Details: multi amputated fingers    Lower Extremity Assessment Lower Extremity Assessment: RLE deficits/detail;LLE deficits/detail RLE Deficits / Details: h/o AKA, hip AROM intact LLE Deficits / Details: s/p AKA revision, wound vac in place LLE: Unable to fully assess due to pain    Cervical / Trunk Assessment Cervical / Trunk Assessment: Kyphotic  Communication   Communication: No difficulties  Cognition Arousal/Alertness: Awake/alert Behavior During Therapy: Flat affect Overall Cognitive Status: No family/caregiver present to determine baseline cognitive functioning                                 General Comments: Following commands. Little verbal interactions with therapist.        General Comments      Exercises     Assessment/Plan    PT Assessment Patient needs continued PT services  PT Problem List Decreased balance;Decreased strength;Decreased mobility;Decreased activity tolerance;Pain       PT Treatment Interventions Functional mobility training;Balance training;Patient/family education;Therapeutic activities;Therapeutic exercise    PT Goals (Current goals can be found in the Care Plan section)  Acute Rehab PT Goals Patient Stated Goal: decrease pain PT Goal Formulation: With patient Time For Goal Achievement:  10/06/22 Potential to Achieve Goals: Fair    Frequency Min 3X/week     Co-evaluation               AM-PAC PT "6 Clicks" Mobility  Outcome Measure Help needed turning from your back to your side while in a flat bed without using bedrails?: A Lot Help needed moving from lying on your back to sitting on the side of a flat bed without using bedrails?: A Lot Help needed moving to and from a bed to a chair (including a wheelchair)?: Total Help needed standing up from a chair using your arms (e.g., wheelchair or bedside chair)?: Total Help needed to walk in hospital room?: Total Help needed climbing 3-5 steps with a railing? : Total 6 Click Score: 8    End of Session   Activity Tolerance: Patient limited by pain Patient left: in bed;with bed alarm set Nurse Communication: Mobility status;Need for lift equipment PT Visit Diagnosis: Pain;Other abnormalities of gait and mobility (R26.89) Pain - Right/Left: Left Pain - part of body: Leg    Time: 9563-8756 PT Time Calculation (min) (ACUTE ONLY): 11 min   Charges:   PT Evaluation $PT Eval Moderate Complexity: 1 Mod  Gloriann Loan., PT  Office # [phone]   Lorriane Shire 09/22/2022, 9:53 AM

## 2022-09-23 ENCOUNTER — Telehealth: Payer: Self-pay

## 2022-09-23 LAB — GLUCOSE, CAPILLARY
Glucose-Capillary: 116 mg/dL — ABNORMAL HIGH (ref 70–99)
Glucose-Capillary: 146 mg/dL — ABNORMAL HIGH (ref 70–99)

## 2022-09-23 MED ORDER — LINEZOLID 600 MG PO TABS: 600.0000 mg | ORAL_TABLET | Freq: Two times a day (BID) | ORAL | 0 refills | Status: DC

## 2022-09-23 MED ORDER — OXYCODONE HCL 5 MG PO TABS: 5.0000 mg | ORAL_TABLET | ORAL | 0 refills | Status: DC | PRN

## 2022-09-23 NOTE — Progress Notes (Signed)
Patient ID: Edward [name], male   DOB: 05-May-1971, 52 y.o.   MRN: [id number] Patient is postoperative day 3 revision left above-the-knee amputation.  There is no drainage in the wound VAC canister.  Cultures are sensitive to Zyvox which does not need adjustment for his dialysis.  Patient has dialysis on Tuesdays.  Patient states he would like to discharge today.  Discharged with the Praveena plus portable wound VAC pump attached to the wound VAC dressing.

## 2022-09-23 NOTE — Telephone Encounter (Signed)
Called patient and left detailed message to call back to be scheduled for an OV with [facility] for reassessment. He is on the wait list for EGD but it has been sometime and he needs reassessment and to make sure he would be cleared by Cardiology. Recent surgery. Asked patient to call back to schedule an appointment with Dr. Havery Moros to discuss EGD

## 2022-09-23 NOTE — Progress Notes (Signed)
PT Cancellation Note  Patient Details Name: Edward [name] MRN: [id number] DOB: 1971-01-27   Cancelled Treatment:    Reason Eval/Treat Not Completed: Patient declined, no reason specified (Pt preparing for discharge, son present. Pt and family report no concerns with transfers or discharge plan.)   Michelle Nasuti 09/23/2022, 11:24 AM

## 2022-09-23 NOTE — Care Management Important Message (Signed)
Important Message  Patient Details  Name: Edward [name] MRN: [id number] Date of Birth: 06/21/1971   Medicare Important Message Given:  No     Hannah Beat 09/23/2022, 2:34 PM

## 2022-09-23 NOTE — Progress Notes (Signed)
Pt and son educated on Preeva wound vac.  Switched over from main wound vac successfully. Son to take care of patient at home.  Pt wheeled out by son via pt's own wheelchair.

## 2022-09-23 NOTE — Discharge Summary (Signed)
Discharge Diagnoses:  Principal Problem:   Dehiscence of amputation stump ([place]) Active Problems:   Dehiscence of amputation stump of left lower extremity ([place])   Surgeries: Procedure(s): LEFT ABOVE THE KNEE AMPUTATION REVISION on 09/20/2022    Consultants: Treatment Team:  Rosita Fire, MD Roney Jaffe, MD  Discharged Condition: Improved  Hospital Course: Edward [name] is an 52 y.o. male who was admitted 09/20/2022 with a chief complaint of dehiscence left above-knee amputation, with a final diagnosis of DEHISCENCE LEFT ABOVE THE KNEE AMPUTATION.  Patient was brought to the operating room on 09/20/2022 and underwent Procedure(s): LEFT ABOVE THE KNEE AMPUTATION REVISION.    Patient was given perioperative antibiotics:  Anti-infectives (From admission, onward)    Start     Dose/Rate Route Frequency Ordered Stop   09/23/22 0000  linezolid (ZYVOX) 600 MG tablet        600 mg Oral 2 times daily 09/23/22 0758     09/22/22 1600  linezolid (ZYVOX) tablet 600 mg        600 mg Oral Every 12 hours 09/22/22 1426     09/20/22 1700  ceFAZolin (ANCEF) IVPB 2g/100 mL premix  Status:  Discontinued        2 g 200 mL/hr over 30 Minutes Intravenous Every 8 hours 09/20/22 1614 09/20/22 1621   09/20/22 1242  vancomycin (VANCOCIN) powder  Status:  Discontinued          As needed 09/20/22 1251 09/20/22 1316   09/20/22 0930  ceFAZolin (ANCEF) IVPB 2g/100 mL premix        2 g 200 mL/hr over 30 Minutes Intravenous On call to O.R. 09/20/22 3532 09/20/22 1242     .  Patient was given sequential compression devices, early ambulation, and aspirin for DVT prophylaxis.  Recent vital signs: Patient Vitals for the past 24 hrs:  BP Temp Temp src Pulse Resp SpO2  09/23/22 0453 94/69 -- -- 91 20 --  09/22/22 1931 99/64 98.2 F (36.8 C) -- 94 20 --  09/22/22 1822 (!) 92/55 -- -- 93 -- --  09/22/22 1640 (!) 81/64 98.5 F (36.9 C) Oral 92 -- 95 %  .  Recent laboratory studies: No results  found.  Discharge Medications:   Allergies as of 09/23/2022       Reactions   Crestor [rosuvastatin] Rash   Eye redness   Dilaudid [hydromorphone] Itching   Robaxin [methocarbamol] Other (See Comments)   Pt states medication made his arm pain worse.   Benadryl [diphenhydramine] Rash        Medication List     STOP taking these medications    silver sulfADIAZINE 1 % cream Commonly known as: SILVADENE       TAKE these medications    acetaminophen 325 MG tablet Commonly known as: TYLENOL Take 1-2 tablets (325-650 mg total) by mouth every 6 (six) hours as needed for mild pain (pain score 1-3 or temp > 100.5).   allopurinol 100 MG tablet Commonly known as: ZYLOPRIM Take by mouth.   ALPRAZolam 0.25 MG tablet Commonly known as: XANAX Take 1 tablet (0.25 mg total) by mouth daily.   aspirin EC 81 MG tablet Take 81 mg by mouth daily. Swallow whole.   atorvastatin 80 MG tablet Commonly known as: LIPITOR Take 1 tablet (80 mg total) by mouth daily.   B-complex with vitamin C tablet Take by mouth.   clopidogrel 75 MG tablet Commonly known as: PLAVIX Take 1 tablet (75 mg total) by mouth daily.  doxycycline 100 MG tablet Commonly known as: VIBRA-TABS Take 1 tablet (100 mg total) by mouth 2 (two) times daily.   ezetimibe 10 MG tablet Commonly known as: ZETIA Take 1 tablet (10 mg total) by mouth daily.   gabapentin 100 MG capsule Commonly known as: NEURONTIN Take 1 capsule (100 mg total) by mouth 3 (three) times daily.   HECTOROL IV Doxercalciferol (Hectorol)   linezolid 600 MG tablet Commonly known as: Zyvox Take 1 tablet (600 mg total) by mouth 2 (two) times daily.   Linzess 72 MCG capsule Generic drug: linaclotide Take 1 capsule (72 mcg total) by mouth in the morning.   midodrine 10 MG tablet Commonly known as: PROAMATINE Take by mouth.   MIRCERA IJ Mircera   oxyCODONE 5 MG immediate release tablet Commonly known as: Oxy IR/ROXICODONE Take 1  tablet (5 mg total) by mouth every 4 (four) hours as needed for severe pain. What changed:  when to take this reasons to take this   pantoprazole 40 MG tablet Commonly known as: PROTONIX Take 1 tablet (40 mg total) by mouth daily.   sodium bicarbonate 650 MG tablet Take by mouth.   Tradjenta 5 MG Tabs tablet Generic drug: linagliptin Take by mouth.   Velphoro 500 MG chewable tablet Generic drug: sucroferric oxyhydroxide Chew 2 tablets (1,000 mg total) by mouth 3 (three) times daily with meals.   Vitamin B Complex Tabs Take 1 tablet by mouth daily.   vitamin C 1000 MG tablet Take 1 tablet (1,000 mg total) by mouth daily.   zinc sulfate 220 (50 Zn) MG capsule Take by mouth.        Diagnostic Studies: No results found.  Patient benefited maximally from their hospital stay and there were no complications.     Disposition: Discharge disposition: 01-Home or Self Care      Discharge Instructions     Call MD / Call 911   Complete by: As directed    If you experience chest pain or shortness of breath, CALL 911 and be transported to the hospital emergency room.  If you develope a fever above 101 F, pus (white drainage) or increased drainage or redness at the wound, or calf pain, call your surgeon's office.   Constipation Prevention   Complete by: As directed    Drink plenty of fluids.  Prune juice may be helpful.  You may use a stool softener, such as Colace (over the counter) 100 mg twice a day.  Use MiraLax (over the counter) for constipation as needed.   Diet - low sodium heart healthy   Complete by: As directed    Increase activity slowly as tolerated   Complete by: As directed    Negative Pressure Wound Therapy - Incisional   Complete by: As directed    Attach wound VAC dressing to the Praveena plus portable wound VAC pump for discharge.   Post-operative opioid taper instructions:   Complete by: As directed    POST-OPERATIVE OPIOID TAPER INSTRUCTIONS: It is  important to wean off of your opioid medication as soon as possible. If you do not need pain medication after your surgery it is ok to stop day one. Opioids include: Codeine, Hydrocodone(Norco, Vicodin), Oxycodone(Percocet, oxycontin) and hydromorphone amongst others.  Long term and even short term use of opiods can cause: Increased pain response Dependence Constipation Depression Respiratory depression And more.  Withdrawal symptoms can include Flu like symptoms Nausea, vomiting And more Techniques to manage these symptoms Hydrate well Eat regular healthy  meals Stay active Use relaxation techniques(deep breathing, meditating, yoga) Do Not substitute Alcohol to help with tapering If you have been on opioids for less than two weeks and do not have pain than it is ok to stop all together.  Plan to wean off of opioids This plan should start within one week post op of your joint replacement. Maintain the same interval or time between taking each dose and first decrease the dose.  Cut the total daily intake of opioids by one tablet each day Next start to increase the time between doses. The last dose that should be eliminated is the evening dose.          Follow-up Information     [name], MD Follow up in 1 week(s).   Specialty: Orthopedic Surgery Contact information: [address] [place] [address] [phone]                  Signed: [name] 09/23/2022, 8:03 AM

## 2022-09-23 NOTE — Progress Notes (Signed)
2/5 Pt discharged before receiving IMM Letter. Patient did receive the original copy, letter will be mailed to the address on file.

## 2022-09-23 NOTE — Progress Notes (Signed)
Patient has been doing a lot of sleeping this shift but he is arousable.  Apparently he was sleeping a lot also during the day yesterday.  He did receive Dilaudid iv on day shift. He will answer and fall back to sleep.  He has been refusing any disturbance with exception to getting vitals and checking glucose.

## 2022-09-23 NOTE — Progress Notes (Signed)
  [facility] Progress Note   Subjective:  Seen in room - some leg pain. No CP/dyspnea. Possibly for discharge today. Next HD is due tomorrow.  Objective Vitals:   09/22/22 1822 09/22/22 1931 09/23/22 0453 09/23/22 0904  BP: (!) '92/55 99/64 94/69 '$ 100/73  Pulse: 93 94 91 82  Resp:  '20 20 18  '$ Temp:  98.2 F (36.8 C)  98 F (36.7 C)  TempSrc:      SpO2:      Weight:       Physical Exam General: Chronically ill appearing man, NAD Heart: RRR Lungs: CTA anteriorly Abdomen: soft Extremities: B AKA, multiple finger amputations. Dialysis Access:   Additional Objective Labs: Basic Metabolic Panel: Recent Labs  Lab 09/20/22 0952 09/21/22 0245 09/22/22 0632  NA 136 138 134*  K 3.5 3.2* 4.0  CL 97* 96* 95*  CO2  --  26 24  GLUCOSE 214* 128* 137*  BUN 32* 39* 28*  CREATININE 5.90* 6.50* 4.54*  CALCIUM  --  8.4* 8.2*   CBC: Recent Labs  Lab 09/20/22 0952 09/21/22 0245 09/22/22 0632  WBC  --  7.6 9.4  HGB 13.9 10.5* 10.4*  HCT 41.0 33.8* 34.6*  MCV  --  93.4 95.8  PLT  --  109* 112*   Blood Culture    Component Value Date/Time   SDES ABSCESS 09/20/2022 1243   SPECREQUEST NONE 09/20/2022 1243   CULT  09/20/2022 1243    RARE ENTEROCOCCUS FAECIUM VANCOMYCIN RESISTANT ENTEROCOCCUS NO ANAEROBES ISOLATED; CULTURE IN PROGRESS FOR 5 DAYS    REPTSTATUS PENDING 09/20/2022 1243   Medications:  sodium chloride 75 mL/hr at 09/20/22 1739   magnesium sulfate bolus IVPB      allopurinol  100 mg Oral BID   ALPRAZolam  0.25 mg Oral Daily   vitamin C  1,000 mg Oral Daily   aspirin EC  81 mg Oral Daily   atorvastatin  80 mg Oral Daily   Chlorhexidine Gluconate Cloth  6 each Topical Q0600   clopidogrel  75 mg Oral Daily   docusate sodium  100 mg Oral Daily   gabapentin  100 mg Oral TID   insulin aspart  0-9 Units Subcutaneous TID WC   insulin aspart  2 Units Subcutaneous TID WC   linaclotide  72 mcg Oral q AM   linagliptin  5 mg Oral Daily   linezolid   600 mg Oral Q12H   midodrine  10 mg Oral BID   nutrition supplement (JUVEN)  1 packet Oral BID BM   pantoprazole  40 mg Oral Daily   sucroferric oxyhydroxide  1,000 mg Oral TID WC   zinc sulfate  220 mg Oral Daily    Dialysis Orders: [place], TTS, 4.5 hrs, EDW 73 kg, 2k  2 ca, BFR 425/700, RIJ TDC for access. Mircera 100 mcg on 1/30 Hectorol 2 mcg   Assessment/Plan: Peripheral vascular disease with a dehiscence of left AKA: S/p revision surgery by Dr. Sharol Given on 2/2.  Wound care, pain management per primary team. ESRD: TTS.  Received HD on 2/3 with 1.4 L ultrafiltration, tolerated well.  Next HD tomorrow if still here. Hypertension/volume: Euvolemic on exam.  UF as tolerated. Anemia of ESRD: Hemoglobin at goal.  Recently received Mircera as outpatient. Metabolic Bone Disease: On Velphoro.  Monitor lab. Hypokalemia: Improved with HD.  Veneta Penton, PA-C 09/23/2022, 10:46 AM  [employer]

## 2022-09-23 NOTE — TOC Transition Note (Signed)
Transition of Care [facility]) - CM/SW Discharge Note   Patient Details  Name: Edward [name] MRN: [id number] Date of Birth: 02-Sep-1970  [facility] CM/SW Contact:  Curlene Labrum, RN Phone Number: 09/23/2022, 10:31 AM   Clinical Narrative:    CM called patient's cell number but was unable to reach him by phone.  The patient is being discharge home with the son today - Medicare choice was provided for home health services and patient was likely active with [facility] per son.  I called Levada Dy, CM with [facility] and she accepted for home health services.  The patient has all dem at home per son and patient will be discharged home with Prevena wound vac and will not need HH RN at this time.  The patient's son wil be providing transport to home today via car with son.   Final next level of care: Home w Home Health Services Barriers to Discharge: No Barriers Identified   Patient Goals and CMS Choice CMS Medicare.gov Compare Post Acute Care list provided to:: Patient Represenative (must comment) Choice offered to / list presented to : Adult Children  Discharge Placement                         Discharge Plan and Services Additional resources added to the After Visit Summary for     Discharge Planning Services: CM Consult Post Acute Care Choice: Home Health                    HH Arranged: PT [facility] Agency: [place] Date [place]: 09/23/22 Time [place]: 1030 Representative spoke with at [place]: Levada Dy, [place] with [facility]  Social Determinants of Health ([place]) Interventions [place]: No Food Insecurity (08/10/2022)  Housing: Low Risk  (08/10/2022)  Transportation Needs: No Transportation Needs (08/10/2022)  Utilities: Not At Risk (08/10/2022)  Depression (PHQ2-9): Medium Risk (06/12/2019)  Tobacco Use: Medium Risk (09/21/2022)     Readmission Risk Interventions    09/23/2022   10:29 AM  04/02/2022   10:33 AM 01/14/2022   12:26 PM  Readmission Risk Prevention Plan  Transportation Screening Complete Complete Complete  PCP or Specialist Appt within 3-5 Days   Complete  HRI or [place]   Complete  Social Work Consult for [place] Planning/Counseling   Complete  Palliative Care Screening   Not Applicable  Medication Review Press photographer) Complete Complete Referral to Pharmacy  PCP or Specialist appointment within 3-5 days of discharge Complete Complete   HRI or [place] Complete Complete   SW Recovery Care/Counseling Consult Complete Complete   Palliative Care Screening Not Complete Not [place] Not Applicable Not Applicable

## 2022-09-24 ENCOUNTER — Telehealth: Payer: Self-pay

## 2022-09-24 DIAGNOSIS — D631 Anemia in chronic kidney disease: Secondary | ICD-10-CM | POA: Diagnosis not present

## 2022-09-24 DIAGNOSIS — Z992 Dependence on renal dialysis: Secondary | ICD-10-CM | POA: Diagnosis not present

## 2022-09-24 DIAGNOSIS — N2581 Secondary hyperparathyroidism of renal origin: Secondary | ICD-10-CM | POA: Diagnosis not present

## 2022-09-24 DIAGNOSIS — N186 End stage renal disease: Secondary | ICD-10-CM | POA: Diagnosis not present

## 2022-09-24 DIAGNOSIS — T8249XD Other complication of vascular dialysis catheter, subsequent encounter: Secondary | ICD-10-CM | POA: Diagnosis not present

## 2022-09-24 DIAGNOSIS — D689 Coagulation defect, unspecified: Secondary | ICD-10-CM | POA: Diagnosis not present

## 2022-09-24 DIAGNOSIS — R52 Pain, unspecified: Secondary | ICD-10-CM | POA: Diagnosis not present

## 2022-09-24 DIAGNOSIS — L299 Pruritus, unspecified: Secondary | ICD-10-CM | POA: Diagnosis not present

## 2022-09-24 DIAGNOSIS — E1129 Type 2 diabetes mellitus with other diabetic kidney complication: Secondary | ICD-10-CM | POA: Diagnosis not present

## 2022-09-24 DIAGNOSIS — R197 Diarrhea, unspecified: Secondary | ICD-10-CM | POA: Diagnosis not present

## 2022-09-24 NOTE — Telephone Encounter (Signed)
I called and sw pt's son. He advised that pt is taking Oxycodone 5 mg q 6-8 hrs. Advised ok per Dr. Sharol Given to increase to 1 po q 4 and to add aleve 2 po bid sch an appt for the pt to come in on Friday to have wound vac removed and advised pt's son to call with any other questions or concerns. Voiced understanding.

## 2022-09-24 NOTE — Telephone Encounter (Signed)
Patient's son Fannie Knee called stating that Patient has been screaming and yelling due to the pain that he is having.  Stated that Oxycodone is not helping and would like a stronger Rx.  EMS was called for patient and was advised that wound vac is attached correctly and to call our office.  Patient had left [facility] on 09/20/2022.  CB# [phone].  Please advise.  Thank you.

## 2022-09-25 LAB — AEROBIC/ANAEROBIC CULTURE W GRAM STAIN (SURGICAL/DEEP WOUND): Gram Stain: NONE SEEN

## 2022-09-27 ENCOUNTER — Encounter: Payer: Self-pay | Admitting: Family

## 2022-09-27 ENCOUNTER — Ambulatory Visit (INDEPENDENT_AMBULATORY_CARE_PROVIDER_SITE_OTHER): Payer: 59 | Admitting: Family

## 2022-09-27 ENCOUNTER — Telehealth: Payer: Self-pay | Admitting: Orthopedic Surgery

## 2022-09-27 DIAGNOSIS — T8781 Dehiscence of amputation stump: Secondary | ICD-10-CM

## 2022-09-27 NOTE — Progress Notes (Signed)
Post-Op Visit Note   Patient: Edward [name]           Date of Birth: 01-Jul-1971           MRN: [id number] Visit Date: 09/27/2022 PCP: [name], MD  Chief Complaint: No chief complaint on file.   HPI:  HPI The patient is a 52 year old gentleman seen status post revision above-knee amputation on the left on February 2. Ortho Exam On examination of the left above-knee amputation staples are in place this is well-approximated appears to be healing quite well there is no active drainage no erythema or warmth  Visit Diagnoses: No diagnosis found.  Plan:   Follow-Up Instructions: No follow-ups on file.   Imaging: No results found.  Orders:  No orders of the defined types were placed in this encounter.  No orders of the defined types were placed in this encounter.    PMFS History: Patient Active Problem List   Diagnosis Date Noted   Dehiscence of amputation stump of left lower extremity ([place]) 09/20/2022   Dehiscence of amputation stump ([place]) 09/20/2022   S/P bilateral above knee amputation ([place]) 08/13/2022   Atherosclerosis of native arteries of extremities with gangrene, left leg ([place]) 08/06/2022   Diabetic foot ulcer ([place]) 08/05/2022   Pectoralis muscle strain 05/10/2022   Cervical lesion 05/10/2022   Hand abscess 0000000   Acute metabolic encephalopathy    PAD (peripheral artery disease) ([place]) 01/07/2022   Osteomyelitis of finger of left hand ([place]) 10/04/2021   Junctional rhythm 10/04/2021   Acute cholecystitis 09/27/2020   Elevated LFTs 09/27/2020   TIA (transient ischemic attack) 09/27/2020   Nausea and vomiting    ESRD on hemodialysis ([place]) 06/22/2020   S/P coronary artery stent placement 06/22/2020   Encounter for removal of sutures 03/07/2020   Fluid overload, unspecified 12/29/2019   Hypertensive chronic kidney disease with stage 1 through stage 4 chronic kidney disease, or unspecified chronic kidney disease 11/30/2019   Pruritus, unspecified  11/30/2019   Secondary hyperparathyroidism of renal origin ([place]) 11/30/2019   Unspecified atrial fibrillation ([place]) 11/30/2019   Pain, unspecified 11/30/2019   Other specified arthritis, unspecified site 11/30/2019   Iron deficiency anemia, unspecified 11/30/2019   End stage renal disease ([place]) 11/30/2019   Encounter for screening for respiratory tuberculosis 11/30/2019   Diarrhea, unspecified 11/30/2019   Coagulation defect, unspecified ([place]) 11/30/2019   Atherosclerotic heart disease of native coronary artery without angina pectoris 11/30/2019   Acute renal failure (ARF) ([place]) 11/22/2019   Transaminitis 12/22/2018   Acute exacerbation of CHF (congestive heart failure) ([place]) 12/21/2018   Gait abnormality 09/11/2018   CAD S/P percutaneous coronary angioplasty 08/06/2018   Cardiomyopathy ([place]) 08/06/2018   PVD (peripheral vascular disease) ([place]) 08/06/2018   Shortness of breath    Chronic combined systolic and diastolic CHF (congestive heart failure) ([place])    History of NSTEMI 07/28/2018   Respiratory failure with hypoxia ([place]) 07/28/2018   Acute heart failure ([place]) 07/28/2018   Hypertensive emergency 07/28/2018   S/P BKA (below knee amputation) unilateral, right ([place]) 07/15/2018   Hypoglycemia    Anxiety about health    Labile blood glucose    Essential hypertension    History of transmetatarsal amputation of left foot (HCC)    Hypoalbuminemia due to protein-calorie malnutrition ([place])    Poorly controlled type 2 diabetes mellitus with peripheral neuropathy (HCC)    Acute blood loss anemia    Anemia of chronic disease    Leukocytosis    Tobacco abuse  Hyperkalemia 06/20/2018   Diabetic polyneuropathy associated with type 2 diabetes mellitus ([place])    Severe protein-calorie malnutrition ([place])    AKI (acute kidney injury) ([place]) 06/17/2018   Normocytic anemia 06/17/2018   Viral pharyngitis 01/14/2017   Cigarette smoker 01/14/2017   Avascular necrosis of hip, left ([place])  10/11/2016   Status post left hip replacement 10/11/2016   S/P transmetatarsal amputation of foot, left ([place]) 07/25/2016   Balance problem 07/25/2016   Fournier's gangrene 08/16/2011   Acute respiratory failure ([place]) 08/16/2011   Septic shock(785.52) 08/16/2011   Diabetes mellitus with complication, with long-term current use of insulin ([place]) 08/16/2011   Past Medical History:  Diagnosis Date   Allergy    Anemia    getting Venofer with dialysis   Anxiety    self reported, sometimes-situational anxiety   Arthritis    bilateral hands/LEFT hip   CHF (congestive heart failure) (HCC)    Chronic kidney disease    t th s dialysis- [place] ---ESRD   Coronary artery disease    Depression    self reported, sometimes-situational depression   Diabetes mellitus    type II- on meds   Dyspnea    sometimes- fluid   GERD (gastroesophageal reflux disease)    on meds   History of blood transfusion    Hypertension    no longer on medication since starting on Hemodialysis   Myocardial infarction ([place]) 2019   Nausea and vomiting    Peripheral vascular disease ([place])    Sleep apnea    does not use CPAP    Family History  Problem Relation Age of Onset   Diabetes Other    Pancreatic cancer Mother 50   Colon cancer Neg Hx    Colon polyps Neg Hx    Esophageal cancer Neg Hx    Rectal cancer Neg Hx    Stomach cancer Neg Hx     Past Surgical History:  Procedure Laterality Date   A/V FISTULAGRAM Left 03/06/2020   Procedure: A/V FISTULAGRAM;  Surgeon: Waynetta Sandy, MD;  Location: [facility];  Service: Cardiovascular;  Laterality: Left;   ABDOMINAL AORTOGRAM N/A 01/07/2022   Procedure: ABDOMINAL AORTOGRAM;  Surgeon: Waynetta Sandy, MD;  Location: [facility];  Service: Cardiovascular;  Laterality: N/A;   ABDOMINAL AORTOGRAM W/LOWER EXTREMITY N/A 04/09/2021   Procedure: ABDOMINAL AORTOGRAM W/LOWER EXTREMITY;  Surgeon: Waynetta Sandy, MD;  Location:  [facility];  Service: Cardiovascular;  Laterality: N/A;   AMPUTATION Left 05/10/2016   Procedure: Left Transmetatarsal Amputation;  Surgeon: [name], MD;  Location: [facility];  Service: Orthopedics;  Laterality: Left;   AMPUTATION Right 05/16/2018   Procedure: PARTIAL RAY AMPUTATION FIFTH TOE RIGHT FOOT;  Surgeon: [name], DPM;  Location: [facility];  Service: Podiatry;  Laterality: Right;   AMPUTATION Right 06/17/2018   Procedure: AMPUTATION 4th RAY Right Foot;  Surgeon: Trula Slade, DPM;  Location: [facility];  Service: Podiatry;  Laterality: Right;   AMPUTATION Right 06/19/2018   Procedure: RIGHT BELOW KNEE AMPUTATION;  Surgeon: [name], MD;  Location: [facility];  Service: Orthopedics;  Laterality: Right;   AMPUTATION Left 10/05/2021   Procedure: LEFT RING FINGER AMPUTATION;  Surgeon: Daryll Brod, MD;  Location: [facility];  Service: Orthopedics;  Laterality: Left;  45 MIN   AMPUTATION Left 11/15/2021   Procedure: LEFT INDEX FINGER AMPUTATION;  Surgeon: Leanora Cover, MD;  Location: [facility];  Service: Orthopedics;  Laterality: Left;  [place] Right 01/08/2022   Procedure: AMPUTATION ABOVE KNEE;  Surgeon: Waynetta Sandy, MD;  Location: [facility];  Service: Vascular;  Laterality: Right;   AMPUTATION Bilateral 01/12/2022   Procedure: LEFT INDEX FINGER AMPUTATION, RIGHT INDEX, LONG AND SMALL DIGITS AMPUTATION;  Surgeon: Leanora Cover, MD;  Location: [facility];  Service: Orthopedics;  Laterality: Bilateral;   AMPUTATION Right 04/01/2022   Procedure: RIGHT INDEX FINGER AMPUTATION AT METACARPAL PHALANGEAL JOINT, INCISION AND DRAINAGE RIGHT HAND;  Surgeon: Leanora Cover, MD;  Location: [facility];  Service: Orthopedics;  Laterality: Right;  90 MIN   AMPUTATION Right 05/23/2022   Procedure: RIGHT INDEX FINGER RAY AMPUTATION; RIGHT LONG, RING, AND SMALL FINGER AMPUTATIONS;  Surgeon: Leanora Cover, MD;  Location: [facility];  Service: Orthopedics;  Laterality: Right;  90 MIN   AMPUTATION Left  08/07/2022   Procedure: LEFT ABOVE KNEE AMPUTATION;  Surgeon: [name], MD;  Location: [facility];  Service: Orthopedics;  Laterality: Left;   AORTIC ARCH ANGIOGRAPHY N/A 01/07/2022   Procedure: AORTIC ARCH ANGIOGRAPHY;  Surgeon: Waynetta Sandy, MD;  Location: [facility];  Service: Cardiovascular;  Laterality: N/A;   APPLICATION OF WOUND VAC Right 06/19/2018   Procedure: APPLICATION OF WOUND VAC;  Surgeon: [name], MD;  Location: [facility];  Service: Orthopedics;  Laterality: Right;   APPLICATION OF WOUND VAC Left 08/07/2022   Procedure: APPLICATION OF WOUND VAC;  Surgeon: [name], MD;  Location: [facility];  Service: Orthopedics;  Laterality: Left;   AV FISTULA PLACEMENT Left 11/25/2019   Procedure: LEFT ARM Brachiocephalic  ARTERIOVENOUS (AV) FISTULA CREATION;  Surgeon: Rosetta Posner, MD;  Location: [facility];  Service: Vascular;  Laterality: Left;   [place] Left 07/19/2020   Procedure: LEFT SECOND STAGE [place];  Surgeon: Waynetta Sandy, MD;  Location: [facility];  Service: Vascular;  Laterality: Left;   CIRCUMCISION  09/02/2011   Procedure: CIRCUMCISION ADULT;  Surgeon: Molli Hazard, MD;  Location: [facility];  Service: Urology;  Laterality: N/A;   CORONARY STENT INTERVENTION N/A 07/29/2018   Procedure: CORONARY STENT INTERVENTION;  Surgeon: Leonie Man, MD;  Location: [facility];  Service: Cardiovascular;  Laterality: N/A;   DEBRIDEMENT AND CLOSURE WOUND Left 11/15/2021   Procedure: LEFT HAND WOUND CLOSURE;  Surgeon: Leanora Cover, MD;  Location: [facility];  Service: Orthopedics;  Laterality: Left;   I & D EXTREMITY  09/02/2011   Procedure: IRRIGATION AND DEBRIDEMENT EXTREMITY;  Surgeon: Theodoro Kos, DO;  Location: [facility];  Service: Plastics;  Laterality: N/A;   INCISION AND DRAINAGE OF WOUND  08/12/2011   Procedure: IRRIGATION AND DEBRIDEMENT WOUND;  Surgeon: Zenovia Jarred, MD;  Location: [facility];  Service: General;;  perineum   INSERTION OF DIALYSIS CATHETER N/A 11/25/2019   Procedure: INSERTION OF Righ Internal Jugular DIALYSIS CATHETER;  Surgeon: Rosetta Posner, MD;  Location: [facility];  Service: Vascular;  Laterality: N/A;   [place]  08/12/2011   Procedure: IRRIGATION AND DEBRIDEMENT ABSCESS;  Surgeon: Molli Hazard, MD;  Location: [facility];  Service: Urology;  Laterality: N/A;  irrigation and debridment perineum     IRRIGATION AND DEBRIDEMENT ABSCESS  08/14/2011   Procedure: MINOR INCISION AND DRAINAGE OF ABSCESS;  Surgeon: Molli Hazard, MD;  Location: [facility];  Service: Urology;  Laterality: N/A;  Perineal Wound Debridement;Placement Bilateral Testicular Thigh Pouches   LEFT HEART CATH AND  CORONARY ANGIOGRAPHY N/A 07/29/2018   Procedure: LEFT HEART CATH AND CORONARY ANGIOGRAPHY;  Surgeon: Leonie Man, MD;  Location: [facility];  Service: Cardiovascular;  Laterality: N/A;   LOWER EXTREMITY ANGIOGRAPHY Bilateral 01/07/2022   Procedure: Lower Extremity Angiography;  Surgeon: Waynetta Sandy, MD;  Location: [facility];  Service: Cardiovascular;  Laterality: Bilateral;   PERIPHERAL VASCULAR ATHERECTOMY Left 04/09/2021   Procedure: PERIPHERAL VASCULAR ATHERECTOMY;  Surgeon: Waynetta Sandy, MD;  Location: [facility];  Service: Cardiovascular;  Laterality: Left;  TP trunk and peroneal arteries   PERIPHERAL VASCULAR BALLOON ANGIOPLASTY Left 04/09/2021   Procedure: PERIPHERAL VASCULAR BALLOON ANGIOPLASTY;  Surgeon: Waynetta Sandy, MD;  Location: [facility];  Service: Cardiovascular;  Laterality: Left;  Popliteal   REVISON OF ARTERIOVENOUS FISTULA Left 05/24/2020   Procedure: LEFT ARM ARTERIOVENOUS FISTULA  CONVERSION TO BASILIC VEIN FISTULA;  Surgeon: Waynetta Sandy, MD;  Location: [facility];  Service: Vascular;  Laterality: Left;   STUMP REVISION Left  09/20/2022   Procedure: LEFT ABOVE THE KNEE AMPUTATION REVISION;  Surgeon: [name], MD;  Location: [facility];  Service: Orthopedics;  Laterality: Left;   TOTAL HIP ARTHROPLASTY Left 10/11/2016   Procedure: LEFT TOTAL HIP ARTHROPLASTY ANTERIOR APPROACH;  Surgeon: Mcarthur Rossetti, MD;  [facility];  Service: Orthopedics;  Laterality: Left;   UPPER EXTREMITY ANGIOGRAPHY Bilateral 01/07/2022   Procedure: Upper Extremity Angiography;  Surgeon: Waynetta Sandy, MD;  Location: [facility];  Service: Cardiovascular;  Laterality: Bilateral;   Social History   Occupational History   Occupation: Disabled  Tobacco Use   Smoking status: Former    Packs/day: 0.25    Years: 24.00    Total pack years: 6.00    Types: Cigarettes    Quit date: 11/19/2019    Years since quitting: 2.8   Smokeless tobacco: Never  Vaping Use   Vaping Use: Never used  Substance and Sexual Activity   Alcohol use: Not Currently    Alcohol/week: 0.0 standard drinks of alcohol    Comment: quit 2018   Drug use: No   Sexual activity: Not Currently    Birth control/protection: Condom

## 2022-09-27 NOTE — Telephone Encounter (Signed)
Edward [name] from [facility] called. She would like verbal orders to continue home services on 09/30/2022. Her call back number is [phone]

## 2022-09-27 NOTE — Telephone Encounter (Signed)
Lm on vm of approval to start services on 09/30/22

## 2022-09-28 DIAGNOSIS — D631 Anemia in chronic kidney disease: Secondary | ICD-10-CM | POA: Diagnosis not present

## 2022-09-28 DIAGNOSIS — R52 Pain, unspecified: Secondary | ICD-10-CM | POA: Diagnosis not present

## 2022-09-28 DIAGNOSIS — D689 Coagulation defect, unspecified: Secondary | ICD-10-CM | POA: Diagnosis not present

## 2022-09-28 DIAGNOSIS — L299 Pruritus, unspecified: Secondary | ICD-10-CM | POA: Diagnosis not present

## 2022-09-28 DIAGNOSIS — N186 End stage renal disease: Secondary | ICD-10-CM | POA: Diagnosis not present

## 2022-09-28 DIAGNOSIS — R197 Diarrhea, unspecified: Secondary | ICD-10-CM | POA: Diagnosis not present

## 2022-09-28 DIAGNOSIS — Z992 Dependence on renal dialysis: Secondary | ICD-10-CM | POA: Diagnosis not present

## 2022-09-28 DIAGNOSIS — E1129 Type 2 diabetes mellitus with other diabetic kidney complication: Secondary | ICD-10-CM | POA: Diagnosis not present

## 2022-09-28 DIAGNOSIS — T8249XD Other complication of vascular dialysis catheter, subsequent encounter: Secondary | ICD-10-CM | POA: Diagnosis not present

## 2022-09-28 DIAGNOSIS — N2581 Secondary hyperparathyroidism of renal origin: Secondary | ICD-10-CM | POA: Diagnosis not present

## 2022-10-01 DIAGNOSIS — E1129 Type 2 diabetes mellitus with other diabetic kidney complication: Secondary | ICD-10-CM | POA: Diagnosis not present

## 2022-10-01 DIAGNOSIS — D689 Coagulation defect, unspecified: Secondary | ICD-10-CM | POA: Diagnosis not present

## 2022-10-01 DIAGNOSIS — N186 End stage renal disease: Secondary | ICD-10-CM | POA: Diagnosis not present

## 2022-10-01 DIAGNOSIS — D631 Anemia in chronic kidney disease: Secondary | ICD-10-CM | POA: Diagnosis not present

## 2022-10-01 DIAGNOSIS — R52 Pain, unspecified: Secondary | ICD-10-CM | POA: Diagnosis not present

## 2022-10-01 DIAGNOSIS — L299 Pruritus, unspecified: Secondary | ICD-10-CM | POA: Diagnosis not present

## 2022-10-01 DIAGNOSIS — Z992 Dependence on renal dialysis: Secondary | ICD-10-CM | POA: Diagnosis not present

## 2022-10-01 DIAGNOSIS — S81802D Unspecified open wound, left lower leg, subsequent encounter: Secondary | ICD-10-CM | POA: Diagnosis not present

## 2022-10-01 DIAGNOSIS — R197 Diarrhea, unspecified: Secondary | ICD-10-CM | POA: Diagnosis not present

## 2022-10-01 DIAGNOSIS — N2581 Secondary hyperparathyroidism of renal origin: Secondary | ICD-10-CM | POA: Diagnosis not present

## 2022-10-01 DIAGNOSIS — T8249XD Other complication of vascular dialysis catheter, subsequent encounter: Secondary | ICD-10-CM | POA: Diagnosis not present

## 2022-10-02 ENCOUNTER — Telehealth: Payer: Self-pay | Admitting: Orthopedic Surgery

## 2022-10-02 DIAGNOSIS — E1152 Type 2 diabetes mellitus with diabetic peripheral angiopathy with gangrene: Secondary | ICD-10-CM | POA: Diagnosis not present

## 2022-10-02 DIAGNOSIS — I5042 Chronic combined systolic (congestive) and diastolic (congestive) heart failure: Secondary | ICD-10-CM | POA: Diagnosis not present

## 2022-10-02 DIAGNOSIS — I70262 Atherosclerosis of native arteries of extremities with gangrene, left leg: Secondary | ICD-10-CM | POA: Diagnosis not present

## 2022-10-02 DIAGNOSIS — Z89612 Acquired absence of left leg above knee: Secondary | ICD-10-CM | POA: Diagnosis not present

## 2022-10-02 DIAGNOSIS — I132 Hypertensive heart and chronic kidney disease with heart failure and with stage 5 chronic kidney disease, or end stage renal disease: Secondary | ICD-10-CM | POA: Diagnosis not present

## 2022-10-02 DIAGNOSIS — Z4781 Encounter for orthopedic aftercare following surgical amputation: Secondary | ICD-10-CM | POA: Diagnosis not present

## 2022-10-02 NOTE — Telephone Encounter (Signed)
Monique given VO approval.

## 2022-10-02 NOTE — Telephone Encounter (Signed)
Skilled to evaluate and treat wound management P/T  1xw for 2 weeks for safety precautions.Edward [name] [facility] [phone])

## 2022-10-03 DIAGNOSIS — R197 Diarrhea, unspecified: Secondary | ICD-10-CM | POA: Diagnosis not present

## 2022-10-03 DIAGNOSIS — N186 End stage renal disease: Secondary | ICD-10-CM | POA: Diagnosis not present

## 2022-10-03 DIAGNOSIS — T8249XD Other complication of vascular dialysis catheter, subsequent encounter: Secondary | ICD-10-CM | POA: Diagnosis not present

## 2022-10-03 DIAGNOSIS — R52 Pain, unspecified: Secondary | ICD-10-CM | POA: Diagnosis not present

## 2022-10-03 DIAGNOSIS — L299 Pruritus, unspecified: Secondary | ICD-10-CM | POA: Diagnosis not present

## 2022-10-03 DIAGNOSIS — N2581 Secondary hyperparathyroidism of renal origin: Secondary | ICD-10-CM | POA: Diagnosis not present

## 2022-10-03 DIAGNOSIS — E1129 Type 2 diabetes mellitus with other diabetic kidney complication: Secondary | ICD-10-CM | POA: Diagnosis not present

## 2022-10-03 DIAGNOSIS — Z992 Dependence on renal dialysis: Secondary | ICD-10-CM | POA: Diagnosis not present

## 2022-10-03 DIAGNOSIS — D689 Coagulation defect, unspecified: Secondary | ICD-10-CM | POA: Diagnosis not present

## 2022-10-03 DIAGNOSIS — D631 Anemia in chronic kidney disease: Secondary | ICD-10-CM | POA: Diagnosis not present

## 2022-10-04 ENCOUNTER — Telehealth: Payer: Self-pay | Admitting: Orthopedic Surgery

## 2022-10-04 NOTE — Telephone Encounter (Signed)
Patient and Son Fannie Knee are concerned about wound and it not healing properly it has already been put under multiple surgeries because it was not healing properly and they would like another Dr possibly someone in the clinic to look at the wound and possibly take over care they are not satisfied with Sharol Given and his work as of now, patient son states wound has been bleeding and it seems like it is not healing properly and it was been over a month since surgery please call son Fannie Knee at [phone]

## 2022-10-05 ENCOUNTER — Observation Stay (HOSPITAL_COMMUNITY)
Admission: EM | Admit: 2022-10-05 | Discharge: 2022-10-07 | Disposition: A | Discharge status: Home Health | Payer: 59 | Attending: Internal Medicine | Admitting: Internal Medicine

## 2022-10-05 ENCOUNTER — Encounter (HOSPITAL_COMMUNITY): Payer: Self-pay

## 2022-10-05 DIAGNOSIS — T148XXA Other injury of unspecified body region, initial encounter: Secondary | ICD-10-CM

## 2022-10-05 DIAGNOSIS — L299 Pruritus, unspecified: Secondary | ICD-10-CM | POA: Diagnosis not present

## 2022-10-05 DIAGNOSIS — Z79899 Other long term (current) drug therapy: Secondary | ICD-10-CM | POA: Insufficient documentation

## 2022-10-05 DIAGNOSIS — I5042 Chronic combined systolic (congestive) and diastolic (congestive) heart failure: Secondary | ICD-10-CM | POA: Diagnosis present

## 2022-10-05 DIAGNOSIS — I9589 Other hypotension: Secondary | ICD-10-CM | POA: Diagnosis not present

## 2022-10-05 DIAGNOSIS — Z89611 Acquired absence of right leg above knee: Secondary | ICD-10-CM | POA: Diagnosis not present

## 2022-10-05 DIAGNOSIS — Z7985 Long-term (current) use of injectable non-insulin antidiabetic drugs: Secondary | ICD-10-CM | POA: Insufficient documentation

## 2022-10-05 DIAGNOSIS — D638 Anemia in other chronic diseases classified elsewhere: Secondary | ICD-10-CM | POA: Diagnosis present

## 2022-10-05 DIAGNOSIS — D689 Coagulation defect, unspecified: Secondary | ICD-10-CM | POA: Diagnosis not present

## 2022-10-05 DIAGNOSIS — Z955 Presence of coronary angioplasty implant and graft: Secondary | ICD-10-CM | POA: Insufficient documentation

## 2022-10-05 DIAGNOSIS — T8781 Dehiscence of amputation stump: Principal | ICD-10-CM | POA: Insufficient documentation

## 2022-10-05 DIAGNOSIS — T8249XD Other complication of vascular dialysis catheter, subsequent encounter: Secondary | ICD-10-CM | POA: Diagnosis not present

## 2022-10-05 DIAGNOSIS — R7989 Other specified abnormal findings of blood chemistry: Secondary | ICD-10-CM | POA: Diagnosis not present

## 2022-10-05 DIAGNOSIS — I959 Hypotension, unspecified: Secondary | ICD-10-CM

## 2022-10-05 DIAGNOSIS — Z89612 Acquired absence of left leg above knee: Secondary | ICD-10-CM | POA: Diagnosis not present

## 2022-10-05 DIAGNOSIS — Z96642 Presence of left artificial hip joint: Secondary | ICD-10-CM | POA: Diagnosis not present

## 2022-10-05 DIAGNOSIS — Z743 Need for continuous supervision: Secondary | ICD-10-CM | POA: Diagnosis not present

## 2022-10-05 DIAGNOSIS — Z992 Dependence on renal dialysis: Secondary | ICD-10-CM | POA: Diagnosis not present

## 2022-10-05 DIAGNOSIS — Z7902 Long term (current) use of antithrombotics/antiplatelets: Secondary | ICD-10-CM | POA: Diagnosis not present

## 2022-10-05 DIAGNOSIS — R778 Other specified abnormalities of plasma proteins: Secondary | ICD-10-CM | POA: Diagnosis not present

## 2022-10-05 DIAGNOSIS — R52 Pain, unspecified: Secondary | ICD-10-CM | POA: Diagnosis not present

## 2022-10-05 DIAGNOSIS — I2510 Atherosclerotic heart disease of native coronary artery without angina pectoris: Secondary | ICD-10-CM

## 2022-10-05 DIAGNOSIS — Z7982 Long term (current) use of aspirin: Secondary | ICD-10-CM | POA: Insufficient documentation

## 2022-10-05 DIAGNOSIS — N2581 Secondary hyperparathyroidism of renal origin: Secondary | ICD-10-CM | POA: Diagnosis not present

## 2022-10-05 DIAGNOSIS — Z87891 Personal history of nicotine dependence: Secondary | ICD-10-CM | POA: Insufficient documentation

## 2022-10-05 DIAGNOSIS — N186 End stage renal disease: Secondary | ICD-10-CM

## 2022-10-05 DIAGNOSIS — D631 Anemia in chronic kidney disease: Secondary | ICD-10-CM | POA: Diagnosis not present

## 2022-10-05 DIAGNOSIS — Z89421 Acquired absence of other right toe(s): Secondary | ICD-10-CM | POA: Insufficient documentation

## 2022-10-05 DIAGNOSIS — Z89422 Acquired absence of other left toe(s): Secondary | ICD-10-CM | POA: Insufficient documentation

## 2022-10-05 DIAGNOSIS — I132 Hypertensive heart and chronic kidney disease with heart failure and with stage 5 chronic kidney disease, or end stage renal disease: Secondary | ICD-10-CM | POA: Diagnosis not present

## 2022-10-05 DIAGNOSIS — R197 Diarrhea, unspecified: Secondary | ICD-10-CM | POA: Diagnosis not present

## 2022-10-05 DIAGNOSIS — E1129 Type 2 diabetes mellitus with other diabetic kidney complication: Secondary | ICD-10-CM | POA: Diagnosis not present

## 2022-10-05 DIAGNOSIS — E1122 Type 2 diabetes mellitus with diabetic chronic kidney disease: Secondary | ICD-10-CM | POA: Diagnosis present

## 2022-10-05 DIAGNOSIS — R531 Weakness: Secondary | ICD-10-CM | POA: Diagnosis not present

## 2022-10-05 DIAGNOSIS — R58 Hemorrhage, not elsewhere classified: Secondary | ICD-10-CM | POA: Diagnosis not present

## 2022-10-05 LAB — COMPREHENSIVE METABOLIC PANEL
ALT: 6 U/L (ref 0–44)
AST: 18 U/L (ref 15–41)
Albumin: 2.4 g/dL — ABNORMAL LOW (ref 3.5–5.0)
Alkaline Phosphatase: 94 U/L (ref 38–126)
Anion gap: 11 (ref 5–15)
BUN: 22 mg/dL — ABNORMAL HIGH (ref 6–20)
CO2: 23 mmol/L (ref 22–32)
Calcium: 8.2 mg/dL — ABNORMAL LOW (ref 8.9–10.3)
Chloride: 100 mmol/L (ref 98–111)
Creatinine, Ser: 3.7 mg/dL — ABNORMAL HIGH (ref 0.61–1.24)
GFR, Estimated: 19 mL/min — ABNORMAL LOW (ref >60)
Glucose, Bld: 150 mg/dL — ABNORMAL HIGH (ref 70–99)
Potassium: 3.5 mmol/L (ref 3.5–5.1)
Sodium: 134 mmol/L — ABNORMAL LOW (ref 135–145)
Total Bilirubin: 1 mg/dL (ref 0.3–1.2)
Total Protein: 6.1 g/dL — ABNORMAL LOW (ref 6.5–8.1)

## 2022-10-05 LAB — I-STAT CHEM 8, ED
BUN: 21 mg/dL — ABNORMAL HIGH (ref 6–20)
Calcium, Ion: 1.01 mmol/L — ABNORMAL LOW (ref 1.15–1.40)
Chloride: 100 mmol/L (ref 98–111)
Creatinine, Ser: 4 mg/dL — ABNORMAL HIGH (ref 0.61–1.24)
Glucose, Bld: 150 mg/dL — ABNORMAL HIGH (ref 70–99)
HCT: 32 % — ABNORMAL LOW (ref 39.0–52.0)
Hemoglobin: 10.9 g/dL — ABNORMAL LOW (ref 13.0–17.0)
Potassium: 3.6 mmol/L (ref 3.5–5.1)
Sodium: 137 mmol/L (ref 135–145)
TCO2: 24 mmol/L (ref 22–32)

## 2022-10-05 LAB — TYPE AND SCREEN
ABO/RH(D): B POS
Antibody Screen: NEGATIVE

## 2022-10-05 LAB — CBC WITH DIFFERENTIAL/PLATELET
Abs Immature Granulocytes: 0.06 10*3/uL (ref 0.00–0.07)
Basophils Absolute: 0 10*3/uL (ref 0.0–0.1)
Basophils Relative: 0 %
Eosinophils Absolute: 0.1 10*3/uL (ref 0.0–0.5)
Eosinophils Relative: 2 %
HCT: 31.8 % — ABNORMAL LOW (ref 39.0–52.0)
Hemoglobin: 9.8 g/dL — ABNORMAL LOW (ref 13.0–17.0)
Immature Granulocytes: 1 %
Lymphocytes Relative: 13 %
Lymphs Abs: 0.9 10*3/uL (ref 0.7–4.0)
MCH: 29.2 pg (ref 26.0–34.0)
MCHC: 30.8 g/dL (ref 30.0–36.0)
MCV: 94.6 fL (ref 80.0–100.0)
Monocytes Absolute: 0.7 10*3/uL (ref 0.1–1.0)
Monocytes Relative: 10 %
Neutro Abs: 4.9 10*3/uL (ref 1.7–7.7)
Neutrophils Relative %: 74 %
Platelets: 188 10*3/uL (ref 150–400)
RBC: 3.36 MIL/uL — ABNORMAL LOW (ref 4.22–5.81)
RDW: 18.1 % — ABNORMAL HIGH (ref 11.5–15.5)
WBC: 6.6 10*3/uL (ref 4.0–10.5)
nRBC: 0 % (ref 0.0–0.2)

## 2022-10-05 LAB — PROTIME-INR
INR: 1.3 — ABNORMAL HIGH (ref 0.8–1.2)
Prothrombin Time: 16.1 seconds — ABNORMAL HIGH (ref 11.4–15.2)

## 2022-10-05 LAB — TROPONIN I (HIGH SENSITIVITY)
Troponin I (High Sensitivity): 170 ng/L (ref <18)
Troponin I (High Sensitivity): 157 ng/L (ref <18)

## 2022-10-05 MED ORDER — SODIUM CHLORIDE 0.9% FLUSH
3.0000 mL | Freq: Two times a day (BID) | INTRAVENOUS | Status: DC
  Administered 2022-10-05 – 2022-10-07 (×4): 3 mL via INTRAVENOUS

## 2022-10-05 MED ORDER — ATORVASTATIN CALCIUM 80 MG PO TABS
80.0000 mg | ORAL_TABLET | Freq: Every day | ORAL | Status: DC
  Administered 2022-10-06 – 2022-10-07 (×2): 80 mg via ORAL
  Filled 2022-10-05 (×3): qty 1

## 2022-10-05 MED ORDER — ONDANSETRON HCL 4 MG PO TABS: 4.0000 mg | ORAL_TABLET | Freq: Four times a day (QID) | ORAL | Status: DC | PRN

## 2022-10-05 MED ORDER — PANTOPRAZOLE SODIUM 40 MG PO TBEC
40.0000 mg | DELAYED_RELEASE_TABLET | Freq: Every day | ORAL | Status: DC
  Administered 2022-10-06 – 2022-10-07 (×2): 40 mg via ORAL
  Filled 2022-10-05 (×2): qty 1

## 2022-10-05 MED ORDER — SODIUM CHLORIDE 0.9 % IV BOLUS
500.0000 mL | Freq: Once | INTRAVENOUS | Status: AC
  Administered 2022-10-05: 500 mL via INTRAVENOUS

## 2022-10-05 MED ORDER — ACETAMINOPHEN 650 MG RE SUPP: 650.0000 mg | Freq: Four times a day (QID) | RECTAL | Status: DC | PRN

## 2022-10-05 MED ORDER — MIDODRINE HCL 5 MG PO TABS
10.0000 mg | ORAL_TABLET | Freq: Three times a day (TID) | ORAL | Status: DC
  Administered 2022-10-05 – 2022-10-07 (×5): 10 mg via ORAL
  Filled 2022-10-05 (×6): qty 2

## 2022-10-05 MED ORDER — GABAPENTIN 100 MG PO CAPS
100.0000 mg | ORAL_CAPSULE | Freq: Three times a day (TID) | ORAL | Status: DC
  Administered 2022-10-06 – 2022-10-07 (×4): 100 mg via ORAL
  Filled 2022-10-05 (×4): qty 1

## 2022-10-05 MED ORDER — ACETAMINOPHEN 325 MG PO TABS: 650.0000 mg | ORAL_TABLET | Freq: Four times a day (QID) | ORAL | Status: DC | PRN

## 2022-10-05 MED ORDER — SENNOSIDES-DOCUSATE SODIUM 8.6-50 MG PO TABS: 1.0000 | ORAL_TABLET | Freq: Every evening | ORAL | Status: DC | PRN

## 2022-10-05 MED ORDER — ONDANSETRON HCL 4 MG/2ML IJ SOLN: 4.0000 mg | Freq: Four times a day (QID) | INTRAMUSCULAR | Status: DC | PRN

## 2022-10-05 MED ORDER — INSULIN ASPART 100 UNIT/ML IJ SOLN: 0.0000 [IU] | Freq: Three times a day (TID) | INTRAMUSCULAR | Status: DC

## 2022-10-05 NOTE — ED Provider Notes (Signed)
[facility] Provider Note   CSN: [id number] Arrival date & time: 10/05/22  1613     History  Chief Complaint  Patient presents with   Post-op Problem    CHIRON UMSTED is a 52 y.o. male history of ESRD on dialysis (last dialysis was today), previous bilateral BKA, here presenting with possible wound dehiscence.  Patient previous BKA performed and had wound dehiscence that required resection by Dr. Sharol Given on February 2.  Patient is now 2 weeks postop.  Patient was seen in the office about a week ago and the staples were in place and there was no drainage at that time.  Patient called the clinic yesterday about bleeding.  Patient was noted to have bleeding from the wound today at dialysis and was sent here.  While in the route, patient may have a syncopal episode versus a seizure.  Patient states that he just felt lightheaded and dizzy.  Patient has no history of seizures in the past.  Patient was noted to be hypotensive in the ED.  The history is provided by the patient.       Home Medications Prior to Admission medications   Medication Sig Start Date End Date Taking? Authorizing Provider  acetaminophen (TYLENOL) 325 MG tablet Take 1-2 tablets (325-650 mg total) by mouth every 6 (six) hours as needed for mild pain (pain score 1-3 or temp > 100.5). 08/22/22   Angiulli, Lavon Paganini, PA-C  allopurinol (ZYLOPRIM) 100 MG tablet Take by mouth. 08/30/21   [provider]  ALPRAZolam Duanne Moron) 0.25 MG tablet Take 1 tablet (0.25 mg total) by mouth daily. 08/22/22   Angiulli, Lavon Paganini, PA-C  ascorbic acid (VITAMIN C) 1000 MG tablet Take 1 tablet (1,000 mg total) by mouth daily. 08/22/22   Angiulli, Lavon Paganini, PA-C  aspirin EC 81 MG tablet Take 81 mg by mouth daily. Swallow whole.    [provider]  atorvastatin (LIPITOR) 80 MG tablet Take 1 tablet (80 mg total) by mouth daily. 08/22/22   Angiulli, Lavon Paganini, PA-C  B Complex Vitamins (VITAMIN B  COMPLEX) TABS Take 1 tablet by mouth daily. 08/22/22   Angiulli, Lavon Paganini, PA-C  B Complex-C (B-COMPLEX WITH VITAMIN C) tablet Take by mouth. 08/23/22   [provider]  clopidogrel (PLAVIX) 75 MG tablet Take 1 tablet (75 mg total) by mouth daily. 08/22/22   Angiulli, Lavon Paganini, PA-C  Doxercalciferol (HECTOROL IV) Doxercalciferol (Hectorol) 08/24/22 08/23/23  [provider]  doxycycline (VIBRA-TABS) 100 MG tablet Take 1 tablet (100 mg total) by mouth 2 (two) times daily. 08/30/22   Suzan Slick, NP  ezetimibe (ZETIA) 10 MG tablet Take 1 tablet (10 mg total) by mouth daily. 08/22/22   Angiulli, Lavon Paganini, PA-C  gabapentin (NEURONTIN) 100 MG capsule Take 1 capsule (100 mg total) by mouth 3 (three) times daily. 08/22/22   Angiulli, Lavon Paganini, PA-C  linaclotide (LINZESS) 72 MCG capsule Take 1 capsule (72 mcg total) by mouth in the morning. 08/22/22   Angiulli, Lavon Paganini, PA-C  linagliptin (TRADJENTA) 5 MG TABS tablet Take by mouth. 08/23/22   [provider]  linezolid (ZYVOX) 600 MG tablet Take 1 tablet (600 mg total) by mouth 2 (two) times daily. 09/23/22   [name], MD  Methoxy PEG-Epoetin Beta (MIRCERA IJ) Mircera 08/27/22 08/26/23  [provider]  midodrine (PROAMATINE) 10 MG tablet Take by mouth. 08/23/22   [provider]  oxyCODONE (OXY IR/ROXICODONE) 5 MG  immediate release tablet Take 1 tablet (5 mg total) by mouth every 4 (four) hours as needed for severe pain. 09/23/22   [name], MD  pantoprazole (PROTONIX) 40 MG tablet Take 1 tablet (40 mg total) by mouth daily. 08/22/22   Angiulli, Lavon Paganini, PA-C  sodium bicarbonate 650 MG tablet Take by mouth. 08/23/22   [provider]  sucroferric oxyhydroxide (VELPHORO) 500 MG chewable tablet Chew 2 tablets (1,000 mg total) by mouth 3 (three) times daily with meals. 08/22/22   Angiulli, Lavon Paganini, PA-C  zinc sulfate 220 (50 Zn) MG capsule Take by mouth. 08/23/22   [provider]      Allergies    Crestor  [rosuvastatin], Dilaudid [hydromorphone], Robaxin [methocarbamol], and Benadryl [diphenhydramine]    Review of Systems   Review of Systems  Skin:  Positive for wound.  Neurological:  Positive for dizziness and weakness.  All other systems reviewed and are negative.   Physical Exam Updated Vital Signs BP (!) 89/63   Temp 98.3 F (36.8 C) (Oral)   Resp (!) 21  Physical Exam Vitals and nursing note reviewed.  Constitutional:      Comments: Chronically ill and pale  HENT:     Head: Normocephalic.     Nose: Nose normal.     Mouth/Throat:     Mouth: Mucous membranes are moist.  Eyes:     Comments: Conjunctiva is pale  Cardiovascular:     Rate and Rhythm: Normal rate and regular rhythm.     Pulses: Normal pulses.     Heart sounds: Normal heart sounds.  Pulmonary:     Effort: Pulmonary effort is normal.     Breath sounds: Normal breath sounds.  Abdominal:     General: Abdomen is flat.     Palpations: Abdomen is soft.  Musculoskeletal:     Cervical back: Normal range of motion and neck supple.     Comments: Bilateral AKA.  Left AKA site with staples in place.  There is some bloody drainage but the wound appears intact.  Patient has good femoral pulses bilaterally  Skin:    Capillary Refill: Capillary refill takes less than 2 seconds.  Neurological:     General: No focal deficit present.  Psychiatric:        Mood and Affect: Mood normal.     ED Results / Procedures / Treatments   Labs (all labs ordered are listed, but only abnormal results are displayed) Labs Reviewed  CBC WITH DIFFERENTIAL/PLATELET  COMPREHENSIVE METABOLIC PANEL  PROTIME-INR  I-STAT CHEM 8, ED  TYPE AND SCREEN  TROPONIN I (HIGH SENSITIVITY)    EKG EKG Interpretation  Date/Time:  Saturday October 05 2022 16:47:36 EST Ventricular Rate:  88 PR Interval:    QRS Duration: 106 QT Interval:  444 QTC Calculation: 538 R Axis:   -81 Text Interpretation: Accelerated junctional rhythm Left anterior  fascicular block Anterolateral infarct, old Prolonged QT interval No significant change since last tracing Confirmed by Wandra Arthurs [phone]) on 10/05/2022 4:48:59 PM  Radiology No results found.  Procedures Procedures    Medications Ordered in ED Medications  sodium chloride 0.9 % bolus 500 mL (500 mLs Intravenous New Bag/Given 10/05/22 1703)    ED Course/ Medical Decision Making/ A&P                             Medical Decision Making KEAGON HOFMANN is a 52 y.o. male here  presenting with bleeding from the postop wound.  Patient had a wound dehiscence previously that required resection of the femur.  Patient is hypotensive in the ED.  Concern for possible hematoma from the postop incision site.  Will get CBC and CMP and type and screen and PT/INR.  Will give gentle hydration.  7 pm Patient's hemoglobin is stable at 9.8.  Patient has no active bleeding.  I did talk to Dr. [name] from orthopedic surgery.  He states that someone from [facility]'s team will see patient tomorrow.  I also noticed that he has elevated troponin of 170.  I consulted Dr. Ples Specter from cardiology.  He states that this is likely demand ischemia from his blood loss and CKD and hypotension.  He does not recommend any heparin given ongoing bleeding.  At this point, hospitalist to admit.  Problems Addressed: Elevated troponin: acute illness or injury Hypotension, unspecified hypotension type: acute illness or injury  Amount and/or Complexity of Data Reviewed Labs: ordered. Decision-making details documented in ED Course. ECG/medicine tests: ordered.  Risk Prescription drug management. Decision regarding hospitalization.    Final Clinical Impression(s) / ED Diagnoses Final diagnoses:  None    Rx / DC Orders ED Discharge Orders     None         Drenda Freeze, MD 10/05/22 2231

## 2022-10-05 NOTE — ED Notes (Signed)
Pt resting, rise/fall of chest noted. Son @ bedside. Call bell in reach. Pain assessed, denies pain. No acute distress noted

## 2022-10-05 NOTE — ED Notes (Signed)
[facility] pt son called for information

## 2022-10-05 NOTE — ED Triage Notes (Signed)
Pt BIB [facility] with c/o surgical site bleeding. Pt had a left leg amputation a couple weeks ago. Family reports that they have been changing his bandage once or twice a week and it has been bleeding since the surgery. Pt is a dialysis pt, had dialysis today but was unable to finish due to not feeling well. Pt is currently A/O x4 currently. Pt had brief episode of eyes fluttering and right sided gaze that lasted for about a min. Pt denies hx of seizures, states that he just fell asleep.   BP 94/60 - pt reports this is baseline for him. RR 20 CBG 201

## 2022-10-05 NOTE — H&P (Signed)
History and Physical    Edward [name] DOB: May 18, 1971 DOA: 10/05/2022  PCP: [name], MD  Patient coming from: Home  I have personally briefly reviewed patient's old medical records in [facility]  Chief Complaint: Bleeding from amputation stump of left leg  HPI: Edward [name] is a 52 y.o. male with medical history significant for ESRD on TTS HD, CAD s/p DES (LCx 07/2018), chronic combined systolic and diastolic CHF (EF [id number] [id number]), severe mitral and tricuspid valve regurgitation, PVD (s/p b/l AKA, multiple finger amputations), chronic hypotension, T2DM, HLD, anemia of chronic disease who presented to the ED for evaluation of bleeding from amputation stump of his left leg.  Patient initially underwent left AKA 08/07/2022 for management of gangrene of the left leg.  He developed dehiscence of the wound site and underwent revision on 09/20/2022.  He was seen in orthopedic clinic as follow-up on 09/27/2022 and at that time staples were in place and wound site appeared to be healing well without drainage or discharge per documentation.  Patient noted some bleeding from his wound site yesterday.  This was again seen today at his dialysis center and he was subsequently sent to the ED for evaluation.  Patient has not had any chest pain.  He reports occasional shortness of breath and lightheadedness when he sits up.  He says he still makes a small amount of urine.  ED Course  Labs/Imaging on admission: I have personally reviewed following labs and imaging studies.  Initial vitals showed BP 89/63, MAP 72, pulse 91, RR 21, temp 98.3 F, SpO2 97% on room air.  Labs showed WBC 6.6, hemoglobin 9.8, platelets 188,000, sodium 134, potassium 3.5, bicarb 23, BUN 22, creatinine 3.70, serum glucose 150, LFTs within normal limits, INR 1.3, troponin 170.  Patient was given 1 L normal saline.  EDP discussed with on-call orthopedics, Dr. [name], he will notify for someone from Dr. Jess Barters  team to see patient tomorrow.  EDP also discussed with on-call cardiology, Dr. Ples Specter, who felt troponin elevation was likely demand ischemia and recommended against anticoagulation due to bleeding.  The hospitalist service was consulted to admit.  Review of Systems: All systems reviewed and are negative except as documented in history of present illness above.   Past Medical History:  Diagnosis Date   Allergy    Anemia    getting Venofer with dialysis   Anxiety    self reported, sometimes-situational anxiety   Arthritis    bilateral hands/LEFT hip   CHF (congestive heart failure) (HCC)    Chronic kidney disease    t th s dialysis- [place] ---ESRD   Coronary artery disease    Depression    self reported, sometimes-situational depression   Diabetes mellitus    type II- on meds   Dyspnea    sometimes- fluid   GERD (gastroesophageal reflux disease)    on meds   History of blood transfusion    Hypertension    no longer on medication since starting on Hemodialysis   Myocardial infarction ([place]) 2019   Nausea and vomiting    Peripheral vascular disease ([place])    Sleep apnea    does not use CPAP    Past Surgical History:  Procedure Laterality Date   A/V FISTULAGRAM Left 03/06/2020   Procedure: A/V FISTULAGRAM;  Surgeon: Waynetta Sandy, MD;  Location: [place] [facility];  Service: Cardiovascular;  Laterality: Left;   ABDOMINAL AORTOGRAM N/A 01/07/2022   Procedure: ABDOMINAL AORTOGRAM;  Surgeon:  Waynetta Sandy, MD;  Location: [place] [facility];  Service: Cardiovascular;  Laterality: N/A;   ABDOMINAL AORTOGRAM W/LOWER EXTREMITY N/A 04/09/2021   Procedure: ABDOMINAL AORTOGRAM W/LOWER EXTREMITY;  Surgeon: Waynetta Sandy, MD;  Location: [place] [facility];  Service: Cardiovascular;  Laterality: N/A;   AMPUTATION Left 05/10/2016   Procedure: Left Transmetatarsal Amputation;  Surgeon: [name], MD;  Location: [place];  Service: Orthopedics;  Laterality:  Left;   AMPUTATION Right 05/16/2018   Procedure: PARTIAL RAY AMPUTATION FIFTH TOE RIGHT FOOT;  Surgeon: [name], DPM;  Location: [place];  Service: Podiatry;  Laterality: Right;   AMPUTATION Right 06/17/2018   Procedure: AMPUTATION 4th RAY Right Foot;  Surgeon: Trula Slade, DPM;  Location: [place];  Service: Podiatry;  Laterality: Right;   AMPUTATION Right 06/19/2018   Procedure: RIGHT BELOW KNEE AMPUTATION;  Surgeon: [name], MD;  Location: [place];  Service: Orthopedics;  Laterality: Right;   AMPUTATION Left 10/05/2021   Procedure: LEFT RING FINGER AMPUTATION;  Surgeon: Daryll Brod, MD;  Location: [place];  Service: Orthopedics;  Laterality: Left;  45 MIN   AMPUTATION Left 11/15/2021   Procedure: LEFT INDEX FINGER AMPUTATION;  Surgeon: Leanora Cover, MD;  Location: [place];  Service: Orthopedics;  Laterality: Left;  60 MIN   AMPUTATION Right 01/08/2022   Procedure: AMPUTATION ABOVE KNEE;  Surgeon: Waynetta Sandy, MD;  Location: [place];  Service: Vascular;  Laterality: Right;   AMPUTATION Bilateral 01/12/2022   Procedure: LEFT INDEX FINGER AMPUTATION, RIGHT INDEX, LONG AND SMALL DIGITS AMPUTATION;  Surgeon: Leanora Cover, MD;  Location: [place];  Service: Orthopedics;  Laterality: Bilateral;   AMPUTATION Right 04/01/2022   Procedure: RIGHT INDEX FINGER AMPUTATION AT METACARPAL PHALANGEAL JOINT, INCISION AND DRAINAGE RIGHT HAND;  Surgeon: Leanora Cover, MD;  Location: [place];  Service: Orthopedics;  Laterality: Right;  90 MIN   AMPUTATION Right 05/23/2022   Procedure: RIGHT INDEX FINGER RAY AMPUTATION; RIGHT LONG, RING, AND SMALL FINGER AMPUTATIONS;  Surgeon: Leanora Cover, MD;  Location: [place];  Service: Orthopedics;  Laterality: Right;  90 MIN   AMPUTATION Left 08/07/2022   Procedure: LEFT ABOVE KNEE AMPUTATION;  Surgeon: [name], MD;  Location: [place];  Service: Orthopedics;  Laterality: Left;   AORTIC ARCH ANGIOGRAPHY N/A 01/07/2022   Procedure: AORTIC ARCH ANGIOGRAPHY;   Surgeon: Waynetta Sandy, MD;  Location: [place] [facility];  Service: Cardiovascular;  Laterality: N/A;   APPLICATION OF WOUND VAC Right 06/19/2018   Procedure: APPLICATION OF WOUND VAC;  Surgeon: [name], MD;  Location: [place];  Service: Orthopedics;  Laterality: Right;   APPLICATION OF WOUND VAC Left 08/07/2022   Procedure: APPLICATION OF WOUND VAC;  Surgeon: [name], MD;  Location: [place];  Service: Orthopedics;  Laterality: Left;   AV FISTULA PLACEMENT Left 11/25/2019   Procedure: LEFT ARM Brachiocephalic  ARTERIOVENOUS (AV) FISTULA CREATION;  Surgeon: Rosetta Posner, MD;  Location: [place];  Service: Vascular;  Laterality: Left;   [place] Left 07/19/2020   Procedure: LEFT SECOND STAGE [place];  Surgeon: Waynetta Sandy, MD;  Location: [place];  Service: Vascular;  Laterality: Left;   CIRCUMCISION  09/02/2011   Procedure: CIRCUMCISION ADULT;  Surgeon: Molli Hazard, MD;  Location: [place];  Service: Urology;  Laterality: N/A;   CORONARY STENT INTERVENTION N/A 07/29/2018   Procedure: CORONARY STENT INTERVENTION;  Surgeon: Leonie Man, MD;  Location:  [place] [facility];  Service: Cardiovascular;  Laterality: N/A;   DEBRIDEMENT AND CLOSURE WOUND Left 11/15/2021   Procedure: LEFT HAND WOUND CLOSURE;  Surgeon: Leanora Cover, MD;  Location: [place];  Service: Orthopedics;  Laterality: Left;   I & D EXTREMITY  09/02/2011   Procedure: IRRIGATION AND DEBRIDEMENT EXTREMITY;  Surgeon: Theodoro Kos, DO;  Location: [place];  Service: Plastics;  Laterality: N/A;   INCISION AND DRAINAGE OF WOUND  08/12/2011   Procedure: IRRIGATION AND DEBRIDEMENT WOUND;  Surgeon: Zenovia Jarred, MD;  Location: [place];  Service: General;;  perineum   INSERTION OF DIALYSIS CATHETER N/A 11/25/2019   Procedure: INSERTION OF Righ Internal Jugular DIALYSIS CATHETER;  Surgeon: Rosetta Posner, MD;  Location: [place];   Service: Vascular;  Laterality: N/A;   [place]  08/12/2011   Procedure: IRRIGATION AND DEBRIDEMENT ABSCESS;  Surgeon: Molli Hazard, MD;  Location: [place];  Service: Urology;  Laterality: N/A;  irrigation and debridment perineum     IRRIGATION AND DEBRIDEMENT ABSCESS  08/14/2011   Procedure: MINOR INCISION AND DRAINAGE OF ABSCESS;  Surgeon: Molli Hazard, MD;  Location: [place];  Service: Urology;  Laterality: N/A;  Perineal Wound Debridement;Placement Bilateral Testicular Thigh Pouches   LEFT HEART CATH AND CORONARY ANGIOGRAPHY N/A 07/29/2018   Procedure: LEFT HEART CATH AND CORONARY ANGIOGRAPHY;  Surgeon: Leonie Man, MD;  Location: [place] [facility];  Service: Cardiovascular;  Laterality: N/A;   LOWER EXTREMITY ANGIOGRAPHY Bilateral 01/07/2022   Procedure: Lower Extremity Angiography;  Surgeon: Waynetta Sandy, MD;  Location: [place] [facility];  Service: Cardiovascular;  Laterality: Bilateral;   PERIPHERAL VASCULAR ATHERECTOMY Left 04/09/2021   Procedure: PERIPHERAL VASCULAR ATHERECTOMY;  Surgeon: Waynetta Sandy, MD;  Location: [place] [facility];  Service: Cardiovascular;  Laterality: Left;  TP trunk and peroneal arteries   PERIPHERAL VASCULAR BALLOON ANGIOPLASTY Left 04/09/2021   Procedure: PERIPHERAL VASCULAR BALLOON ANGIOPLASTY;  Surgeon: Waynetta Sandy, MD;  Location: [place] [facility];  Service: Cardiovascular;  Laterality: Left;  Popliteal   REVISON OF ARTERIOVENOUS FISTULA Left 05/24/2020   Procedure: LEFT ARM ARTERIOVENOUS FISTULA  CONVERSION TO BASILIC VEIN FISTULA;  Surgeon: Waynetta Sandy, MD;  Location: [place];  Service: Vascular;  Laterality: Left;   STUMP REVISION Left 09/20/2022   Procedure: LEFT ABOVE THE KNEE AMPUTATION REVISION;  Surgeon: [name], MD;  Location: [place];  Service: Orthopedics;  Laterality: Left;   TOTAL HIP ARTHROPLASTY Left 10/11/2016   Procedure: LEFT TOTAL HIP  ARTHROPLASTY ANTERIOR APPROACH;  Surgeon: Mcarthur Rossetti, MD;  Location[facility];  Service: Orthopedics;  Laterality: Left;   UPPER EXTREMITY ANGIOGRAPHY Bilateral 01/07/2022   Procedure: Upper Extremity Angiography;  Surgeon: Waynetta Sandy, MD;  Location: [place] [facility];  Service: Cardiovascular;  Laterality: Bilateral;    Social History:  reports that he quit smoking about 2 years ago. His smoking use included cigarettes. He has a 6.00 pack-year smoking history. He has never used smokeless tobacco. He reports that he does not currently use alcohol. He reports that he does not use drugs.  Allergies  Allergen Reactions   Crestor [Rosuvastatin] Rash    Eye redness   Dilaudid [Hydromorphone] Itching   Robaxin [Methocarbamol] Other (See Comments)    Pt states medication made his arm pain worse.   Benadryl [Diphenhydramine] Rash    Family History  Problem Relation Age of Onset   Diabetes Other  Pancreatic cancer Mother 12   Colon cancer Neg Hx    Colon polyps Neg Hx    Esophageal cancer Neg Hx    Rectal cancer Neg Hx    Stomach cancer Neg Hx      Prior to Admission medications   Medication Sig Start Date End Date Taking? Authorizing Provider  acetaminophen (TYLENOL) 325 MG tablet Take 1-2 tablets (325-650 mg total) by mouth every 6 (six) hours as needed for mild pain (pain score 1-3 or temp > 100.5). 08/22/22   Angiulli, Lavon Paganini, PA-C  allopurinol (ZYLOPRIM) 100 MG tablet Take by mouth. 08/30/21   [provider]  ALPRAZolam Duanne Moron) 0.25 MG tablet Take 1 tablet (0.25 mg total) by mouth daily. 08/22/22   Angiulli, Lavon Paganini, PA-C  ascorbic acid (VITAMIN C) 1000 MG tablet Take 1 tablet (1,000 mg total) by mouth daily. 08/22/22   Angiulli, Lavon Paganini, PA-C  aspirin EC 81 MG tablet Take 81 mg by mouth daily. Swallow whole.    [provider]  atorvastatin (LIPITOR) 80 MG tablet Take 1 tablet (80 mg total) by mouth daily. 08/22/22   Angiulli, Lavon Paganini, PA-C   B Complex Vitamins (VITAMIN B COMPLEX) TABS Take 1 tablet by mouth daily. 08/22/22   Angiulli, Lavon Paganini, PA-C  B Complex-C (B-COMPLEX WITH VITAMIN C) tablet Take by mouth. 08/23/22   [provider]  clopidogrel (PLAVIX) 75 MG tablet Take 1 tablet (75 mg total) by mouth daily. 08/22/22   Angiulli, Lavon Paganini, PA-C  Doxercalciferol (HECTOROL IV) Doxercalciferol (Hectorol) 08/24/22 08/23/23  [provider]  doxycycline (VIBRA-TABS) 100 MG tablet Take 1 tablet (100 mg total) by mouth 2 (two) times daily. 08/30/22   Suzan Slick, NP  ezetimibe (ZETIA) 10 MG tablet Take 1 tablet (10 mg total) by mouth daily. 08/22/22   Angiulli, Lavon Paganini, PA-C  gabapentin (NEURONTIN) 100 MG capsule Take 1 capsule (100 mg total) by mouth 3 (three) times daily. 08/22/22   Angiulli, Lavon Paganini, PA-C  linaclotide (LINZESS) 72 MCG capsule Take 1 capsule (72 mcg total) by mouth in the morning. 08/22/22   Angiulli, Lavon Paganini, PA-C  linagliptin (TRADJENTA) 5 MG TABS tablet Take by mouth. 08/23/22   [provider]  linezolid (ZYVOX) 600 MG tablet Take 1 tablet (600 mg total) by mouth 2 (two) times daily. 09/23/22   [name], MD  Methoxy PEG-Epoetin Beta (MIRCERA IJ) Mircera 08/27/22 08/26/23  [provider]  midodrine (PROAMATINE) 10 MG tablet Take by mouth. 08/23/22   [provider]  oxyCODONE (OXY IR/ROXICODONE) 5 MG immediate release tablet Take 1 tablet (5 mg total) by mouth every 4 (four) hours as needed for severe pain. 09/23/22   [name], MD  pantoprazole (PROTONIX) 40 MG tablet Take 1 tablet (40 mg total) by mouth daily. 08/22/22   Angiulli, Lavon Paganini, PA-C  sodium bicarbonate 650 MG tablet Take by mouth. 08/23/22   [provider]  sucroferric oxyhydroxide (VELPHORO) 500 MG chewable tablet Chew 2 tablets (1,000 mg total) by mouth 3 (three) times daily with meals. 08/22/22   Angiulli, Lavon Paganini, PA-C  zinc sulfate 220 (50 Zn) MG capsule Take by mouth. 08/23/22   [provider]     Physical Exam: Vitals:   10/05/22 1630 10/05/22 1645 10/05/22 1958 10/05/22 2030  BP: (!) 89/63 (!) 87/63  (!) 85/70  Pulse:    86  Resp: (!) 21 18  19  $ Temp:   (!) 97.3 F (36.3 C)  97.8 F (36.6 C)  TempSrc:   Oral Oral  SpO2:    97%   Constitutional: Chronically ill-appearing man resting supine in bed.  NAD, calm, comfortable Eyes: EOMI, lids and conjunctivae normal ENMT: Mucous membranes are moist. Posterior pharynx clear of any exudate or lesions.Normal dentition.  Neck: normal, supple, no masses. Respiratory: clear to auscultation bilaterally, no wheezing, no crackles. Normal respiratory effort. No accessory muscle use.  Cardiovascular: Regular rate and rhythm, no murmurs / rubs / gallops.  HD cath in place right chest wall.. Abdomen: no tenderness, no masses palpated.  Musculoskeletal: S/p bilateral AKA.  Staples in place left amputation stump site with bloody drainage as pictured below.  S/p amputation of multiple fingers on both hands. Skin: Left leg amputation stump site with staples in place and bloody drainage as pictured below Neurologic: Sensation intact. Strength equal bilaterally. Psychiatric: Alert and oriented x 3.   EKG: Personally reviewed. Sinus rhythm with first-degree AV block, rate 88, LAFB, QTc 538.  Motion artifact present.  Assessment/Plan Principal Problem:   Dehiscence of amputation stump of left lower extremity (HCC) Active Problems:   Elevated troponin   ESRD on hemodialysis (HCC)   Anemia of chronic disease   Type 2 diabetes mellitus with chronic kidney disease, without long-term current use of insulin (HCC)   Chronic combined systolic and diastolic CHF (congestive heart failure) ([place])   CAD S/P percutaneous coronary angioplasty   Chronic hypotension   Edward [name] is a 52 y.o. male with medical history significant for ESRD on TTS HD, CAD s/p DES (LCx 07/2018), chronic combined systolic and diastolic CHF (EF [id number] [id number]), severe mitral  and tricuspid valve regurgitation, PVD (s/p right BKA, left AKA, multiple finger amputations), chronic hypotension, T2DM, HLD, anemia of chronic disease who is admitted with bleeding and wound dehiscence of left AKA stump site.  Assessment and Plan: Dehiscence/bleeding of left AKA stump: Initial left AKA 07/2022 and status post revision 09/20/2022 presenting with bleeding from surgical site.  EDP spoke with on-call orthopedics, Dr. [name], they will consult in the morning.  CAD s/p DES to LCx 07/2018 Elevated troponin: Initial troponin elevated at 170 and downtrending to 157 on repeat.  Patient does not have any chest pain.  Most likely demand ischemia in setting of hypotension and ESRD. -Aspirin and Plavix on hold for now with active bleeding -Continue atorvastatin  Chronic hypotension: Continue midodrine 10 mg 3 times daily.  ESRD on TTS HD: Last HD today (2/17).  No emergent need for dialysis.  Will need nephrology consult if remains in hospital for next session due 2/20.  Anemia of chronic disease: Hemoglobin stable.  Continue to monitor.  Chronic combined systolic and diastolic CHF EF [id number] [id number]: Volume management with dialysis.  Appears euvolemic.  Type 2 diabetes: Placed on SSI.  Last A1c 6.3% in September.   DVT prophylaxis: None Code Status: Full code, confirmed with patient on admission Family Communication: Discussed with patient, he has discussed with family Disposition Plan: From home, dispo pending clinical progress Consults called: Orthopedics to consult, EDP also discussed with cardiology Severity of Illness: The appropriate patient status for this patient is OBSERVATION. Observation status is judged to be reasonable and necessary in order to provide the required intensity of service to ensure the patient's safety. The patient's presenting symptoms, physical exam findings, and initial radiographic and laboratory data in the context of their medical condition is felt to  place them at decreased risk for further clinical deterioration. Furthermore, it is anticipated  that the patient will be medically stable for discharge from the hospital within 2 midnights of admission.   Zada Finders MD [facility]  If 7PM-7AM, please contact night-coverage [email or web address]  10/05/2022, 10:14 PM

## 2022-10-05 NOTE — Hospital Course (Signed)
Edward [name] is a 52 y.o. male with PMH ESRD on TTS HD, CAD s/p DES (LCx 07/2018), chronic combined systolic and diastolic CHF (EF [id number] Q000111Q), severe mitral and tricuspid valve regurgitation, PVD (s/p right BKA, left AKA, multiple finger amputations), chronic hypotension, T2DM, HLD, anemia of chronic disease who is admitted with ongoing bleeding from left AKA stump site.  There was concern for potential dehiscence and he was referred to the hospital for admission after completing HD on 2/17.  He was evaluated by orthopedic surgery during hospitalization.  There was no concern or evidence of dehiscence nor any underlying signs of infection.  He was recommended to continue wound care as being done at home and will also follow-up outpatient with orthopedic surgery.

## 2022-10-06 DIAGNOSIS — Z89612 Acquired absence of left leg above knee: Secondary | ICD-10-CM

## 2022-10-06 DIAGNOSIS — T8781 Dehiscence of amputation stump: Secondary | ICD-10-CM | POA: Diagnosis not present

## 2022-10-06 DIAGNOSIS — N186 End stage renal disease: Secondary | ICD-10-CM | POA: Diagnosis not present

## 2022-10-06 DIAGNOSIS — Z992 Dependence on renal dialysis: Secondary | ICD-10-CM

## 2022-10-06 LAB — GLUCOSE, CAPILLARY
Glucose-Capillary: 127 mg/dL — ABNORMAL HIGH (ref 70–99)
Glucose-Capillary: 127 mg/dL — ABNORMAL HIGH (ref 70–99)
Glucose-Capillary: 126 mg/dL — ABNORMAL HIGH (ref 70–99)
Glucose-Capillary: 120 mg/dL — ABNORMAL HIGH (ref 70–99)
Glucose-Capillary: 95 mg/dL (ref 70–99)

## 2022-10-06 LAB — BASIC METABOLIC PANEL
Anion gap: 14 (ref 5–15)
BUN: 25 mg/dL — ABNORMAL HIGH (ref 6–20)
CO2: 23 mmol/L (ref 22–32)
Calcium: 8.5 mg/dL — ABNORMAL LOW (ref 8.9–10.3)
Chloride: 100 mmol/L (ref 98–111)
Creatinine, Ser: 4.22 mg/dL — ABNORMAL HIGH (ref 0.61–1.24)
GFR, Estimated: 16 mL/min — ABNORMAL LOW (ref >60)
Glucose, Bld: 136 mg/dL — ABNORMAL HIGH (ref 70–99)
Potassium: 3.8 mmol/L (ref 3.5–5.1)
Sodium: 137 mmol/L (ref 135–145)

## 2022-10-06 LAB — CBC
HCT: 34 % — ABNORMAL LOW (ref 39.0–52.0)
Hemoglobin: 9.9 g/dL — ABNORMAL LOW (ref 13.0–17.0)
MCH: 28.3 pg (ref 26.0–34.0)
MCHC: 29.1 g/dL — ABNORMAL LOW (ref 30.0–36.0)
MCV: 97.1 fL (ref 80.0–100.0)
Platelets: 210 10*3/uL (ref 150–400)
RBC: 3.5 MIL/uL — ABNORMAL LOW (ref 4.22–5.81)
RDW: 18.1 % — ABNORMAL HIGH (ref 11.5–15.5)
WBC: 7 10*3/uL (ref 4.0–10.5)
nRBC: 0 % (ref 0.0–0.2)

## 2022-10-06 LAB — HIV ANTIBODY (ROUTINE TESTING W REFLEX): HIV Screen 4th Generation wRfx: NONREACTIVE

## 2022-10-06 MED ORDER — CHLORHEXIDINE GLUCONATE CLOTH 2 % EX PADS
6.0000 | MEDICATED_PAD | Freq: Every day | CUTANEOUS | Status: DC
  Administered 2022-10-06 – 2022-10-07 (×2): 6 via TOPICAL

## 2022-10-06 MED ORDER — ACETAMINOPHEN 500 MG PO TABS
1000.0000 mg | ORAL_TABLET | Freq: Three times a day (TID) | ORAL | Status: DC | PRN
  Administered 2022-10-06: 1000 mg via ORAL
  Filled 2022-10-06: qty 2

## 2022-10-06 MED ORDER — ALPRAZOLAM 0.25 MG PO TABS
0.2500 mg | ORAL_TABLET | Freq: Every day | ORAL | Status: DC | PRN
  Filled 2022-10-06: qty 1

## 2022-10-06 MED ORDER — HYDROXYZINE HCL 25 MG PO TABS
25.0000 mg | ORAL_TABLET | Freq: Three times a day (TID) | ORAL | Status: DC | PRN
  Administered 2022-10-06: 25 mg via ORAL
  Filled 2022-10-06: qty 1

## 2022-10-06 MED ORDER — HYDROCORTISONE 1 % EX CREA
TOPICAL_CREAM | Freq: Four times a day (QID) | CUTANEOUS | Status: DC
  Administered 2022-10-06 (×2): 1 via TOPICAL
  Filled 2022-10-06: qty 28

## 2022-10-06 NOTE — Progress Notes (Signed)
Subjective:    Patient reports pain as mild.  Notes that he has had intermittent bloody drainage from aka stump.  No injury.  Currently on plavix and asa.  Objective: Vital signs in last 24 hours: Temp:  [97.3 F (36.3 C)-98.3 F (36.8 C)] 98 F (36.7 C) (02/18 0743) Pulse Rate:  [86-91] 91 (02/18 0849) Resp:  [14-22] 18 (02/18 0743) BP: (83-99)/(59-70) 99/65 (02/18 0849) SpO2:  [91 %-97 %] 97 % (02/18 0743) Weight:  [69.8 kg] 69.8 kg (02/18 0130)  Intake/Output from previous day: 02/17 0701 - 02/18 0700 In: 1000 [IV Piggyback:1000] Out: -  Intake/Output this shift: No intake/output data recorded.  Recent Labs    10/05/22 1625 10/05/22 1720 10/06/22 0621  HGB 9.8* 10.9* 9.9*   Recent Labs    10/05/22 1625 10/05/22 1720 10/06/22 0621  WBC 6.6  --  7.0  RBC 3.36*  --  3.50*  HCT 31.8* 32.0* 34.0*  PLT 188  --  210   Recent Labs    10/05/22 1625 10/05/22 1720 10/06/22 0621  NA 134* 137 137  K 3.5 3.6 3.8  CL 100 100 100  CO2 23  --  23  BUN 22* 21* 25*  CREATININE 3.70* 4.00* 4.22*  GLUCOSE 150* 150* 136*  CALCIUM 8.2*  --  8.5*   Recent Labs    10/05/22 1625  INR 1.3*    Neurologically intact Incision: scant drainage No cellulitis present Left aka stump- staples in place.  There is scant blood drainage to the bandage.  Staples are in place and there is no evidence of wound dehiscence.  No cellulitis or signs of infection  Assessment/Plan:    PLAN NWB LLE Continue with compression wrap to left aka stump Dr. Sharol Given to see in the am      Edward [name] 10/06/2022, 9:47 AM

## 2022-10-06 NOTE — ED Notes (Signed)
Report to [place], [place]

## 2022-10-06 NOTE — Assessment & Plan Note (Addendum)
-   No signs/symptoms of exacerbation -No diuretics.  Volume managed with dialysis -Prior echo from 09/27/2020 reviewed: EF 50 to 55%, Gr 2 DD. Abnormal septal motion Distal septal / apical hypokinesis

## 2022-10-06 NOTE — Assessment & Plan Note (Signed)
-   HD on TTSa, last session 2/17 - suspect should be discharged before needing HD again -Monitor labs while hospitalized

## 2022-10-06 NOTE — Assessment & Plan Note (Signed)
-   Initially elevated troponin on admission with some downtrend.  No chest pain complaints.  Elevation likely combination of underlying demand ischemia from some hypotension but also elevation in setting of ESRD -Patient and his son state that he has not been on aspirin and Plavix recently outpatient

## 2022-10-06 NOTE — Care Management (Signed)
  Transition of Care [facility]) Screening Note   Patient Details  Name: Edward [name] Date of Birth: 1971/03/09   [facility] of [facility] CM/SW Contact:    Carles Collet, RN Phone Number: 10/06/2022, 1:11 PM    Transition of Care Department [facility]) has reviewed patient and no TOC needs have been identified at this time. We will continue to monitor patient advancement through interdisciplinary progression rounds.   [name] is a 52 y.o. male with PMH ESRD on TTS HD, CAD s/p DES (LCx 07/2018), chronic combined systolic and diastolic CHF (EF [id number] [id number]), severe mitral and tricuspid valve regurgitation, PVD (s/p right BKA, left AKA, multiple finger amputations), chronic hypotension, T2DM, HLD, anemia of chronic disease who is admitted with ongoing bleeding from left AKA stump site.   L AKA site revised 09/20/22 and patient was DC'd with son on 2/9 with [facility] services through [place] and no new DME needs at that time.

## 2022-10-06 NOTE — Assessment & Plan Note (Signed)
-   Initially underwent left AKA on 07/2022 followed by revision on 09/20/2022 due to bleeding from surgical site.  Patient continues to have ongoing mild oozing of blood from surgical site. - There was concern for wound dehiscence while patient was in dialysis on 10/05/2022 and he was therefore referred to the hospital for orthopedic evaluation - Currently no signs/symptoms of dehiscence or underlying cellulitis/infection.  Appreciate orthopedic evaluation - Continue wound care and dressing in place; patient stable for discharging home with outpatient follow-up with orthopedic surgery

## 2022-10-06 NOTE — Assessment & Plan Note (Addendum)
-   Continue statin -Patient states he has not been on aspirin and Plavix as it was discontinued outpatient

## 2022-10-06 NOTE — Assessment & Plan Note (Addendum)
Resume home regimen. 

## 2022-10-06 NOTE — Progress Notes (Signed)
Admitted to room 4e15 from ED. Alert and oriented x 4. Hypotensive but patient states that is his baseline/asymptomatic. Reports 10/10 LLE pain 2/2 dehiscence. Initially refused Tylenol but after explanation that he could not have opioids 2/2 hypotension, patient agreeable. MD alerted about pain and BP. New orders to increase Tylenol. Skin assessment complete with Corie Chiquito, RN. Pt has multiple pressure and moisture associated skin damage to sacrum/buttocks/groin and back. Wound consult placed. Oriented to room and surroundings. Denies all other needs at present. Call bell and personal items placed in reach.

## 2022-10-06 NOTE — Progress Notes (Signed)
Progress Note    Edward [name]   [id number]  DOB: [birth year]  DOA: 10/05/2022     0 PCP: [name], MD  Initial CC: Bleeding from left surgical stump  Hospital Course: [name] is a 52 y.o. male with PMH ESRD on TTS HD, CAD s/p DES (LCx 07/2018), chronic combined systolic and diastolic CHF (EF [id number]), severe mitral and tricuspid valve regurgitation, PVD (s/p right BKA, left AKA, multiple finger amputations), chronic hypotension, T2DM, HLD, anemia of chronic disease who is admitted with ongoing bleeding from left AKA stump site.  There was concern for potential dehiscence and he was referred to the hospital for admission after completing HD on 2/17.   Interval History:  Admitted overnight.  Resting in bed when seen this morning.  Very mild weeping of blood from staples.  No obvious dehiscence or signs of underlying cellulitis/infection.  Patient essentially bedbound at baseline.  Assessment and Plan: * S/P AKA (above knee amputation), left ([place]) - Initially underwent left AKA on 07/2022 followed by revision on 09/20/2022 due to bleeding from surgical site.  Patient continues to have ongoing mild oozing of blood from surgical site. - There was concern for wound dehiscence while patient was in dialysis on 10/05/2022 and he was therefore referred to the hospital for orthopedic evaluation - Currently no signs/symptoms of dehiscence or underlying cellulitis/infection.  Appreciate orthopedic evaluation - Continue wound care and dressing in place and follow-up further orthopedic recommendations  Elevated troponin - Initially elevated troponin on admission with some downtrend.  No chest pain complaints.  Elevation likely combination of underlying demand ischemia from some hypotension but also elevation in setting of ESRD - Aspirin and Plavix on hold in setting of bleeding from surgical site  ESRD on hemodialysis ([place]) - HD on TTSa, last session 2/17 - suspect should be discharged  before needing HD again -Monitor labs while hospitalized  Chronic hypotension - Blood pressure currently stable - Continue midodrine  CAD S/P percutaneous coronary angioplasty - Continue statin - Aspirin Plavix on hold for now in setting of mild bleeding from surgical site  Chronic combined systolic and diastolic CHF (congestive heart failure) (HCC) - No signs/symptoms of exacerbation -No diuretics.  Volume managed with dialysis -Prior echo from 09/27/2020 reviewed: EF 50 to 55%, Gr 2 DD. Abnormal septal motion Distal septal / apical hypokinesis   Type 2 diabetes mellitus with chronic kidney disease, without long-term current use of insulin (HCC) - Continue SSI and CBG monitoring  Anemia of chronic disease - Baseline hemoglobin 10 g/dL - Currently at baseline   Old records reviewed in assessment of this patient  Antimicrobials:   DVT prophylaxis:   Okay to hold chemical ppx for now; unable to place SCD also  Code Status:   Code Status: Full Code  Mobility Assessment (last 72 hours)     Mobility Assessment     Row Name 10/06/22 0130           Does patient have an order for bedrest or is patient medically unstable No - Continue assessment       What is the highest level of mobility based on the progressive mobility assessment? Level 2 ([place]) - Balance while sitting on edge of bed and cannot stand       Is the above level different from baseline mobility prior to current illness? No - Consider discontinuing PT/OT                Barriers to discharge:  Disposition Plan:  Home 1-2 days Status is: Obs   Objective: Blood pressure (!) 89/67, pulse 90, temperature 98 F (36.7 C), temperature source Oral, resp. rate 20, weight 69.8 kg, SpO2 97 %.  Examination:  Physical Exam Constitutional:      General: He is not in acute distress.    Appearance: Normal appearance.  HENT:     Head: Normocephalic and atraumatic.     Mouth/Throat:     Mouth: Mucous  membranes are moist.  Eyes:     Extraocular Movements: Extraocular movements intact.  Cardiovascular:     Rate and Rhythm: Normal rate and regular rhythm.  Pulmonary:     Effort: Pulmonary effort is normal. No respiratory distress.     Breath sounds: Normal breath sounds. No wheezing.  Abdominal:     General: Bowel sounds are normal. There is no distension.     Palpations: Abdomen is soft.     Tenderness: There is no abdominal tenderness.  Musculoskeletal:     Cervical back: Normal range of motion and neck supple.     Comments: B/L AKA noted; left stump has staples in place with mild weeping of blood noted; no purulent drainage and no TTP nor erythema/edema.  Also noted multiple amputated fingers on both hands with contracted hardened fingers in left hand  Skin:    General: Skin is warm and dry.  Neurological:     General: No focal deficit present.     Mental Status: He is alert.      Consultants:  Orthopedic surgery  Procedures:    Data Reviewed: Results for orders placed or performed during the hospital encounter of 10/05/22 (from the past 24 hour(s))  CBC with Differential     Status: Abnormal   Collection Time: 10/05/22  4:25 PM  Result Value Ref Range   WBC 6.6 4.0 - 10.5 K/uL   RBC 3.36 (L) 4.22 - 5.81 MIL/uL   Hemoglobin 9.8 (L) 13.0 - 17.0 g/dL   HCT 31.8 (L) 39.0 - 52.0 %   MCV 94.6 80.0 - 100.0 fL   MCH 29.2 26.0 - 34.0 pg   MCHC 30.8 30.0 - 36.0 g/dL   RDW 18.1 (H) 11.5 - 15.5 %   Platelets 188 150 - 400 K/uL   nRBC 0.0 0.0 - 0.2 %   Neutrophils Relative % 74 %   Neutro Abs 4.9 1.7 - 7.7 K/uL   Lymphocytes Relative 13 %   Lymphs Abs 0.9 0.7 - 4.0 K/uL   Monocytes Relative 10 %   Monocytes Absolute 0.7 0.1 - 1.0 K/uL   Eosinophils Relative 2 %   Eosinophils Absolute 0.1 0.0 - 0.5 K/uL   Basophils Relative 0 %   Basophils Absolute 0.0 0.0 - 0.1 K/uL   Immature Granulocytes 1 %   Abs Immature Granulocytes 0.06 0.00 - 0.07 K/uL  Comprehensive metabolic  panel     Status: Abnormal   Collection Time: 10/05/22  4:25 PM  Result Value Ref Range   Sodium 134 (L) 135 - 145 mmol/L   Potassium 3.5 3.5 - 5.1 mmol/L   Chloride 100 98 - 111 mmol/L   CO2 23 22 - 32 mmol/L   Glucose, Bld 150 (H) 70 - 99 mg/dL   BUN 22 (H) 6 - 20 mg/dL   Creatinine, Ser 3.70 (H) 0.61 - 1.24 mg/dL   Calcium 8.2 (L) 8.9 - 10.3 mg/dL   Total Protein 6.1 (L) 6.5 - 8.1 g/dL   Albumin 2.4 (L) 3.5 -  5.0 g/dL   AST 18 15 - 41 U/L   ALT 6 0 - 44 U/L   Alkaline Phosphatase 94 38 - 126 U/L   Total Bilirubin 1.0 0.3 - 1.2 mg/dL   GFR, Estimated 19 (L) >60 mL/min   Anion gap 11 5 - 15  Protime-INR     Status: Abnormal   Collection Time: 10/05/22  4:25 PM  Result Value Ref Range   Prothrombin Time 16.1 (H) 11.4 - 15.2 seconds   INR 1.3 (H) 0.8 - 1.2  Troponin I (High Sensitivity)     Status: Abnormal   Collection Time: 10/05/22  4:25 PM  Result Value Ref Range   Troponin I (High Sensitivity) 170 (HH) <18 ng/L  Type and screen     Status: None   Collection Time: 10/05/22  5:07 PM  Result Value Ref Range   ABO/RH(D) B POS    Antibody Screen NEG    Sample Expiration      10/08/2022,2359 Performed at [facility], [place] [address], [place], [place] [address]   I-stat chem 8, ED (not at [facility], [facility] or [facility])     Status: Abnormal   Collection Time: 10/05/22  5:20 PM  Result Value Ref Range   Sodium 137 135 - 145 mmol/L   Potassium 3.6 3.5 - 5.1 mmol/L   Chloride 100 98 - 111 mmol/L   BUN 21 (H) 6 - 20 mg/dL   Creatinine, Ser 4.00 (H) 0.61 - 1.24 mg/dL   Glucose, Bld 150 (H) 70 - 99 mg/dL   Calcium, Ion 1.01 (L) 1.15 - 1.40 mmol/L   TCO2 24 22 - 32 mmol/L   Hemoglobin 10.9 (L) 13.0 - 17.0 g/dL   HCT 32.0 (L) 39.0 - 52.0 %  Troponin I (High Sensitivity)     Status: Abnormal   Collection Time: 10/05/22  8:25 PM  Result Value Ref Range   Troponin I (High Sensitivity) 157 (HH) <18 ng/L  CBC     Status: Abnormal   Collection Time: 10/06/22  6:21 AM  Result Value Ref  Range   WBC 7.0 4.0 - 10.5 K/uL   RBC 3.50 (L) 4.22 - 5.81 MIL/uL   Hemoglobin 9.9 (L) 13.0 - 17.0 g/dL   HCT 34.0 (L) 39.0 - 52.0 %   MCV 97.1 80.0 - 100.0 fL   MCH 28.3 26.0 - 34.0 pg   MCHC 29.1 (L) 30.0 - 36.0 g/dL   RDW 18.1 (H) 11.5 - 15.5 %   Platelets 210 150 - 400 K/uL   nRBC 0.0 0.0 - 0.2 %  Basic metabolic panel     Status: Abnormal   Collection Time: 10/06/22  6:21 AM  Result Value Ref Range   Sodium 137 135 - 145 mmol/L   Potassium 3.8 3.5 - 5.1 mmol/L   Chloride 100 98 - 111 mmol/L   CO2 23 22 - 32 mmol/L   Glucose, Bld 136 (H) 70 - 99 mg/dL   BUN 25 (H) 6 - 20 mg/dL   Creatinine, Ser 4.22 (H) 0.61 - 1.24 mg/dL   Calcium 8.5 (L) 8.9 - 10.3 mg/dL   GFR, Estimated 16 (L) >60 mL/min   Anion gap 14 5 - 15  Glucose, capillary     Status: Abnormal   Collection Time: 10/06/22  7:00 AM  Result Value Ref Range   Glucose-Capillary 127 (H) 70 - 99 mg/dL  Glucose, capillary     Status: Abnormal   Collection Time: 10/06/22  7:48  AM  Result Value Ref Range   Glucose-Capillary 127 (H) 70 - 99 mg/dL    I have reviewed pertinent nursing notes, vitals, labs, and images as necessary. I have ordered labwork to follow up on as indicated.  I have reviewed the last notes from staff over past 24 hours. I have discussed patient's care plan and test results with nursing staff, CM/SW, and other staff as appropriate.  Time spent: Greater than 50% of the 55 minute visit was spent in counseling/coordination of care for the patient as laid out in the A&P.   LOS: 0 days   Dwyane Dee, MD [facility] 10/06/2022, 11:35 AM

## 2022-10-06 NOTE — Assessment & Plan Note (Signed)
-   Blood pressure currently stable - Continue midodrine

## 2022-10-06 NOTE — Assessment & Plan Note (Signed)
-   Baseline hemoglobin 10 g/dL - Currently at baseline

## 2022-10-06 NOTE — ED Notes (Signed)
ED TO INPATIENT HANDOFF REPORT  ED Nurse Name and Phone #: Albina Billet [phone]  S Name/Age/Gender Edward [name] 52 y.o. male Room/Bed: 003C/003C  Code Status   Code Status: Full Code  Home/SNF/Other Home Patient oriented to: self, place, time, and situation Is this baseline? Yes   Triage Complete: Triage complete  Chief Complaint Dehiscence of amputation stump of left lower extremity ([place]) [T87.81]  Triage Note Pt BIB [facility] with c/o surgical site bleeding. Pt had a left leg amputation a couple weeks ago. Family reports that they have been changing his bandage once or twice a week and it has been bleeding since the surgery. Pt is a dialysis pt, had dialysis today but was unable to finish due to not feeling well. Pt is currently A/O x4 currently. Pt had brief episode of eyes fluttering and right sided gaze that lasted for about a min. Pt denies hx of seizures, states that he just fell asleep.   BP 94/60 - pt reports this is baseline for him. RR 20 CBG 201   Allergies Allergies  Allergen Reactions   Crestor [Rosuvastatin] Rash    Eye redness   Dilaudid [Hydromorphone] Itching   Robaxin [Methocarbamol] Other (See Comments)    Pt states medication made his arm pain worse.   Benadryl [Diphenhydramine] Rash    Level of Care/Admitting Diagnosis ED Disposition   ED Disposition: Admit Condition: None Comment: Hospital Area: [place] [[id number]]  Level of Care: Progressive [102]  Admit to Progressive based on following criteria: CARDIOVASCULAR & THORACIC of moderate stability with acute coronary syndrome symptoms/low risk myocardial infarction/hypertensive urgency/arrhythmias/heart failure potentially compromising stability and stable post cardiovascular intervention patients.  May place patient in observation at [facility] or [place] if equivalent level of care is available:: No  Covid Evaluation: Asymptomatic - no recent exposure (last 10 days) testing not  required  Diagnosis: Dehiscence of amputation stump of left lower extremity [facility]) [id number]  Admitting Physician: Lenore Cordia [id number]  Attending Physician: Lenore Cordia [id number]      B Medical/Surgery History Past Medical History:  Diagnosis Date   Allergy    Anemia    getting Venofer with dialysis   Anxiety    self reported, sometimes-situational anxiety   Arthritis    bilateral hands/LEFT hip   CHF (congestive heart failure) ([place])    Chronic kidney disease    t th s dialysis- [place] ---ESRD   Coronary artery disease    Depression    self reported, sometimes-situational depression   Diabetes mellitus    type II- on meds   Dyspnea    sometimes- fluid   GERD (gastroesophageal reflux disease)    on meds   History of blood transfusion    Hypertension    no longer on medication since starting on Hemodialysis   Myocardial infarction ([place]) 2019   Nausea and vomiting    Peripheral vascular disease ([place])    Sleep apnea    does not use CPAP   Past Surgical History:  Procedure Laterality Date   A/V FISTULAGRAM Left 03/06/2020   Procedure: A/V FISTULAGRAM;  Surgeon: Waynetta Sandy, MD;  Location: [place] CV LAB;  Service: Cardiovascular;  Laterality: Left;   ABDOMINAL AORTOGRAM N/A 01/07/2022   Procedure: ABDOMINAL AORTOGRAM;  Surgeon: Waynetta Sandy, MD;  Location: [place] [facility];  Service: Cardiovascular;  Laterality: N/A;   ABDOMINAL AORTOGRAM W/LOWER EXTREMITY N/A 04/09/2021   Procedure: ABDOMINAL AORTOGRAM W/LOWER EXTREMITY;  Surgeon: Servando Snare  Harrell Gave, MD;  Location: [place] [facility] LAB;  Service: Cardiovascular;  Laterality: N/A;   AMPUTATION Left 05/10/2016   Procedure: Left Transmetatarsal Amputation;  Surgeon: [name], MD;  Location: [place];  Service: Orthopedics;  Laterality: Left;   AMPUTATION Right 05/16/2018   Procedure: PARTIAL RAY AMPUTATION FIFTH TOE RIGHT FOOT;  Surgeon: [name], DPM;  Location: [place];   Service: Podiatry;  Laterality: Right;   AMPUTATION Right 06/17/2018   Procedure: AMPUTATION 4th RAY Right Foot;  Surgeon: Trula Slade, DPM;  Location: [place];  Service: Podiatry;  Laterality: Right;   AMPUTATION Right 06/19/2018   Procedure: RIGHT BELOW KNEE AMPUTATION;  Surgeon: [name], MD;  Location: [place];  Service: Orthopedics;  Laterality: Right;   AMPUTATION Left 10/05/2021   Procedure: LEFT RING FINGER AMPUTATION;  Surgeon: Daryll Brod, MD;  Location: [place];  Service: Orthopedics;  Laterality: Left;  45 MIN   AMPUTATION Left 11/15/2021   Procedure: LEFT INDEX FINGER AMPUTATION;  Surgeon: Leanora Cover, MD;  Location: [place];  Service: Orthopedics;  Laterality: Left;  60 MIN   AMPUTATION Right 01/08/2022   Procedure: AMPUTATION ABOVE KNEE;  Surgeon: Waynetta Sandy, MD;  Location: [place];  Service: Vascular;  Laterality: Right;   AMPUTATION Bilateral 01/12/2022   Procedure: LEFT INDEX FINGER AMPUTATION, RIGHT INDEX, LONG AND SMALL DIGITS AMPUTATION;  Surgeon: Leanora Cover, MD;  Location: [place];  Service: Orthopedics;  Laterality: Bilateral;   AMPUTATION Right 04/01/2022   Procedure: RIGHT INDEX FINGER AMPUTATION AT METACARPAL PHALANGEAL JOINT, INCISION AND DRAINAGE RIGHT HAND;  Surgeon: Leanora Cover, MD;  Location: [place];  Service: Orthopedics;  Laterality: Right;  90 MIN   AMPUTATION Right 05/23/2022   Procedure: RIGHT INDEX FINGER RAY AMPUTATION; RIGHT LONG, RING, AND SMALL FINGER AMPUTATIONS;  Surgeon: Leanora Cover, MD;  Location: [place];  Service: Orthopedics;  Laterality: Right;  90 MIN   AMPUTATION Left 08/07/2022   Procedure: LEFT ABOVE KNEE AMPUTATION;  Surgeon: [name], MD;  Location: [place];  Service: Orthopedics;  Laterality: Left;   AORTIC ARCH ANGIOGRAPHY N/A 01/07/2022   Procedure: AORTIC ARCH ANGIOGRAPHY;  Surgeon: Waynetta Sandy, MD;  Location: [place] [facility];  Service: Cardiovascular;  Laterality: N/A;   APPLICATION OF WOUND VAC  Right 06/19/2018   Procedure: APPLICATION OF WOUND VAC;  Surgeon: [name], MD;  Location: [place];  Service: Orthopedics;  Laterality: Right;   APPLICATION OF WOUND VAC Left 08/07/2022   Procedure: APPLICATION OF WOUND VAC;  Surgeon: [name], MD;  Location: [place];  Service: Orthopedics;  Laterality: Left;   AV FISTULA PLACEMENT Left 11/25/2019   Procedure: LEFT ARM Brachiocephalic  ARTERIOVENOUS (AV) FISTULA CREATION;  Surgeon: Rosetta Posner, MD;  Location: [place];  Service: Vascular;  Laterality: Left;   [place] Left 07/19/2020   Procedure: LEFT SECOND STAGE [place];  Surgeon: Waynetta Sandy, MD;  Location: [place];  Service: Vascular;  Laterality: Left;   CIRCUMCISION  09/02/2011   Procedure: CIRCUMCISION ADULT;  Surgeon: Molli Hazard, MD;  Location: [place];  Service: Urology;  Laterality: N/A;   CORONARY STENT INTERVENTION N/A 07/29/2018   Procedure: CORONARY STENT INTERVENTION;  Surgeon: Leonie Man, MD;  Location: [place] [facility];  Service: Cardiovascular;  Laterality: N/A;   DEBRIDEMENT AND CLOSURE WOUND Left 11/15/2021   Procedure: LEFT HAND WOUND CLOSURE;  Surgeon: Leanora Cover, MD;  Location: [place];  Service: Orthopedics;  Laterality: Left;   I & D EXTREMITY  09/02/2011   Procedure: IRRIGATION AND DEBRIDEMENT EXTREMITY;  Surgeon: Theodoro Kos, DO;  Location: [place];  Service: Plastics;  Laterality: N/A;   INCISION AND DRAINAGE OF WOUND  08/12/2011   Procedure: IRRIGATION AND DEBRIDEMENT WOUND;  Surgeon: Zenovia Jarred, MD;  Location: [place];  Service: General;;  perineum   INSERTION OF DIALYSIS CATHETER N/A 11/25/2019   Procedure: INSERTION OF [place]igh Internal Jugular DIALYSIS CATHETER;  Surgeon: Rosetta Posner, MD;  Location: [place];  Service: Vascular;  Laterality: N/A;   [place]  08/12/2011   Procedure: IRRIGATION AND DEBRIDEMENT ABSCESS;   Surgeon: Molli Hazard, MD;  Location: [place];  Service: Urology;  Laterality: N/A;  irrigation and debridment perineum     IRRIGATION AND DEBRIDEMENT ABSCESS  08/14/2011   Procedure: MINOR INCISION AND DRAINAGE OF ABSCESS;  Surgeon: Molli Hazard, MD;  Location: [place];  Service: Urology;  Laterality: N/A;  Perineal Wound Debridement;Placement Bilateral Testicular Thigh Pouches   LEFT HEART CATH AND CORONARY ANGIOGRAPHY N/A 07/29/2018   Procedure: LEFT HEART CATH AND CORONARY ANGIOGRAPHY;  Surgeon: Leonie Man, MD;  Location: [place] [facility];  Service: Cardiovascular;  Laterality: N/A;   LOWER EXTREMITY ANGIOGRAPHY Bilateral 01/07/2022   Procedure: Lower Extremity Angiography;  Surgeon: Waynetta Sandy, MD;  Location: [place] [facility];  Service: Cardiovascular;  Laterality: Bilateral;   PERIPHERAL VASCULAR ATHERECTOMY Left 04/09/2021   Procedure: PERIPHERAL VASCULAR ATHERECTOMY;  Surgeon: Waynetta Sandy, MD;  Location: [place] [facility];  Service: Cardiovascular;  Laterality: Left;  TP trunk and peroneal arteries   PERIPHERAL VASCULAR BALLOON ANGIOPLASTY Left 04/09/2021   Procedure: PERIPHERAL VASCULAR BALLOON ANGIOPLASTY;  Surgeon: Waynetta Sandy, MD;  Location: [place] [facility];  Service: Cardiovascular;  Laterality: Left;  Popliteal   REVISON OF ARTERIOVENOUS FISTULA Left 05/24/2020   Procedure: LEFT ARM ARTERIOVENOUS FISTULA  CONVERSION TO BASILIC VEIN FISTULA;  Surgeon: Waynetta Sandy, MD;  Location: [place];  Service: Vascular;  Laterality: Left;   STUMP REVISION Left 09/20/2022   Procedure: LEFT ABOVE THE KNEE AMPUTATION REVISION;  Surgeon: [name], MD;  Location: [place];  Service: Orthopedics;  Laterality: Left;   TOTAL HIP ARTHROPLASTY Left 10/11/2016   Procedure: LEFT TOTAL HIP ARTHROPLASTY ANTERIOR APPROACH;  Surgeon: Mcarthur Rossetti, MD;  Location: [facility];  Service: Orthopedics;  Laterality: Left;   UPPER  EXTREMITY ANGIOGRAPHY Bilateral 01/07/2022   Procedure: Upper Extremity Angiography;  Surgeon: Waynetta Sandy, MD;  Location: [place] [facility];  Service: Cardiovascular;  Laterality: Bilateral;     A IV Location/Drains/Wounds Patient Lines/Drains/Airways Status     Active Line/Drains/Airways     Name Placement date Placement time Site Days   Peripheral IV 10/05/22 22 G Right Forearm 10/05/22  1703  Forearm  1   Fistula / Graft Left Arteriovenous fistula 11/25/19  1250  no documentation  1046   Fistula / Graft Left Upper arm Arteriovenous fistula no documentation  no documentation  Upper arm  no documentation   Hemodialysis Catheter Right Subclavian no documentation  no documentation  Subclavian  no documentation   Negative Pressure Wound Therapy Thigh Distal;Left 09/20/22  no documentation  no documentation  16   Wound / Incision (Open or Dehisced) 05/10/22 Non-pressure wound Hand Anterior;Right 05/10/22  2100  Hand  149   Wound / Incision (Open or Dehisced) 08/15/22 Amputation;Incision - Open Finger (  Comment which one) Right right hand 3rd finger MCP amputation with open wound 08/15/22  1100  Finger (Comment which one)  52            Intake/Output Last 24 hours  Intake/Output Summary (Last 24 hours) at 10/06/2022 0016 Last data filed at 10/05/2022 2207 Gross per 24 hour  Intake 1000 ml  Output no documentation  Net 1000 ml    Labs/Imaging Results for orders placed or performed during the hospital encounter of 10/05/22 (from the past 48 hour(s))  CBC with Differential     Status: Abnormal   Collection Time: 10/05/22  4:25 PM  Result Value Ref Range   WBC 6.6 4.0 - 10.5 K/uL   RBC 3.36 (L) 4.22 - 5.81 MIL/uL   Hemoglobin 9.8 (L) 13.0 - 17.0 g/dL   HCT 31.8 (L) 39.0 - 52.0 %   MCV 94.6 80.0 - 100.0 fL   MCH 29.2 26.0 - 34.0 pg   MCHC 30.8 30.0 - 36.0 g/dL   RDW 18.1 (H) 11.5 - 15.5 %   Platelets 188 150 - 400 K/uL   nRBC 0.0 0.0 - 0.2 %   Neutrophils  Relative % 74 %   Neutro Abs 4.9 1.7 - 7.7 K/uL   Lymphocytes Relative 13 %   Lymphs Abs 0.9 0.7 - 4.0 K/uL   Monocytes Relative 10 %   Monocytes Absolute 0.7 0.1 - 1.0 K/uL   Eosinophils Relative 2 %   Eosinophils Absolute 0.1 0.0 - 0.5 K/uL   Basophils Relative 0 %   Basophils Absolute 0.0 0.0 - 0.1 K/uL   Immature Granulocytes 1 %   Abs Immature Granulocytes 0.06 0.00 - 0.07 K/uL    Comment: Performed at [facility], [address] [place], [place] [address]  Comprehensive metabolic panel     Status: Abnormal   Collection Time: 10/05/22  4:25 PM  Result Value Ref Range   Sodium 134 (L) 135 - 145 mmol/L   Potassium 3.5 3.5 - 5.1 mmol/L   Chloride 100 98 - 111 mmol/L   CO2 23 22 - 32 mmol/L   Glucose, Bld 150 (H) 70 - 99 mg/dL    Comment: Glucose reference range applies only to samples taken after fasting for at least 8 hours.   BUN 22 (H) 6 - 20 mg/dL   Creatinine, Ser 3.70 (H) 0.61 - 1.24 mg/dL   Calcium 8.2 (L) 8.9 - 10.3 mg/dL   Total Protein 6.1 (L) 6.5 - 8.1 g/dL   Albumin 2.4 (L) 3.5 - 5.0 g/dL   AST 18 15 - 41 U/L   ALT 6 0 - 44 U/L   Alkaline Phosphatase 94 38 - 126 U/L   Total Bilirubin 1.0 0.3 - 1.2 mg/dL   GFR, Estimated 19 (L) >60 mL/min    Comment: (NOTE) Calculated using the CKD-EPI Creatinine Equation (2021)    Anion gap 11 5 - 15    Comment: Performed at [facility], [place] [address] [place], [place] [address]  Protime-INR     Status: Abnormal   Collection Time: 10/05/22  4:25 PM  Result Value Ref Range   Prothrombin Time 16.1 (H) 11.4 - 15.2 seconds   INR 1.3 (H) 0.8 - 1.2    Comment: (NOTE) INR goal varies based on device and disease states. Performed at [facility], [place] [address] [place], [place] [address]   Troponin I (High Sensitivity)     Status: Abnormal   Collection  Time: 10/05/22  4:25 PM  Result Value Ref Range   Troponin I (High Sensitivity) 170 (HH) <18 ng/L    Comment: CRITICAL RESULT CALLED TO, READ BACK BY AND  VERIFIED WITH Demetrio Lapping, RN AT [phone] 10/05/22 BY D LONG (NOTE) Elevated high sensitivity troponin I (hsTnI) values and significant  changes across serial measurements may suggest ACS but many other  chronic and acute conditions are known to elevate hsTnI results.  Refer to the "Links" section for chest pain algorithms and additional  guidance. Performed at [facility], [place] [address], [place], [place] [address]   Type and screen     Status: None   Collection Time: 10/05/22  5:07 PM  Result Value Ref Range   ABO/RH(D) B POS    Antibody Screen NEG    Sample Expiration      10/08/2022,2359 Performed at [facility], [place] [address], [place], [place] [address]   I-stat chem 8, ED (not at [facility], [facility] or [facility])     Status: Abnormal   Collection Time: 10/05/22  5:20 PM  Result Value Ref Range   Sodium 137 135 - 145 mmol/L   Potassium 3.6 3.5 - 5.1 mmol/L   Chloride 100 98 - 111 mmol/L   BUN 21 (H) 6 - 20 mg/dL   Creatinine, Ser 4.00 (H) 0.61 - 1.24 mg/dL   Glucose, Bld 150 (H) 70 - 99 mg/dL    Comment: Glucose reference range applies only to samples taken after fasting for at least 8 hours.   Calcium, Ion 1.01 (L) 1.15 - 1.40 mmol/L   TCO2 24 22 - 32 mmol/L   Hemoglobin 10.9 (L) 13.0 - 17.0 g/dL   HCT 32.0 (L) 39.0 - 52.0 %  Troponin I (High Sensitivity)     Status: Abnormal   Collection Time: 10/05/22  8:25 PM  Result Value Ref Range   Troponin I (High Sensitivity) 157 (HH) <18 ng/L    Comment: CRITICAL VALUE NOTED. VALUE IS CONSISTENT WITH PREVIOUSLY REPORTED/CALLED VALUE (NOTE) Elevated high sensitivity troponin I (hsTnI) values and significant  changes across serial measurements may suggest ACS but many other  chronic and acute conditions are known to elevate hsTnI results.  Refer to the "Links" section for chest pain algorithms and additional  guidance. Performed at [facility], [place] [address], [place], [place] [address]    No results found.  Pending  Labs Unresulted Labs (From admission, onward)     Start     Ordered   10/06/22 0500  HIV Antibody (routine testing w rflx)  (HIV Antibody (Routine testing w reflex) panel)  Tomorrow morning,   R        10/05/22 2202   10/06/22 0500  CBC  Tomorrow morning,   R        10/05/22 2202   10/06/22 [id number]  Basic metabolic panel  Tomorrow morning,   R        10/05/22 2202            Vitals/Pain Today's Vitals   10/05/22 1645 10/05/22 1958 10/05/22 2030 10/05/22 2219  BP: (Abnormal) 87/63  (Abnormal) 85/70 (Abnormal) 85/70  Pulse:   86 88  Resp: 18  19 17  $ Temp:  (Abnormal) 97.3 F (36.3 C) 97.8 F (36.6 C)   TempSrc:  Oral Oral   SpO2:   97%   PainSc:   0-No pain 0-No pain    Isolation Precautions No active isolations  Medications Medications  midodrine (PROAMATINE) tablet 10 mg (10 mg Oral Given 10/05/22 2226)  sodium chloride flush (NS) 0.9 % injection 3 mL (3 mLs Intravenous Given 10/05/22 2212)  acetaminophen (TYLENOL) tablet 650 mg (has no administration in time range)    Or  acetaminophen (TYLENOL) suppository 650 mg (has no administration in time range)  ondansetron (ZOFRAN) tablet 4 mg (has no administration in time range)    Or  ondansetron (ZOFRAN) injection 4 mg (has no administration in time range)  senna-docusate (Senokot-S) tablet 1 tablet (has no administration in time range)  insulin aspart (novoLOG) injection 0-6 Units (has no administration in time range)  atorvastatin (LIPITOR) tablet 80 mg (has no administration in time range)  gabapentin (NEURONTIN) capsule 100 mg (has no administration in time range)  pantoprazole (PROTONIX) EC tablet 40 mg (has no administration in time range)  sodium chloride 0.9 % bolus 500 mL (0 mLs Intravenous Stopped 10/05/22 1819)  sodium chloride 0.9 % bolus 500 mL (0 mLs Intravenous Stopped 10/05/22 2207)    Mobility non-ambulatory     Focused Assessments Musculoskeletal    R Recommendations: See Admitting Provider  Note  Report given to:   Additional Notes: A&Ox4.

## 2022-10-07 DIAGNOSIS — R7989 Other specified abnormal findings of blood chemistry: Secondary | ICD-10-CM | POA: Diagnosis not present

## 2022-10-07 DIAGNOSIS — R531 Weakness: Secondary | ICD-10-CM | POA: Diagnosis not present

## 2022-10-07 DIAGNOSIS — Z992 Dependence on renal dialysis: Secondary | ICD-10-CM | POA: Diagnosis not present

## 2022-10-07 DIAGNOSIS — Z89612 Acquired absence of left leg above knee: Secondary | ICD-10-CM | POA: Diagnosis not present

## 2022-10-07 DIAGNOSIS — Z7401 Bed confinement status: Secondary | ICD-10-CM | POA: Diagnosis not present

## 2022-10-07 DIAGNOSIS — N186 End stage renal disease: Secondary | ICD-10-CM | POA: Diagnosis not present

## 2022-10-07 DIAGNOSIS — Z743 Need for continuous supervision: Secondary | ICD-10-CM | POA: Diagnosis not present

## 2022-10-07 DIAGNOSIS — T8781 Dehiscence of amputation stump: Secondary | ICD-10-CM | POA: Diagnosis not present

## 2022-10-07 LAB — CBC WITH DIFFERENTIAL/PLATELET
Abs Immature Granulocytes: 0.16 10*3/uL — ABNORMAL HIGH (ref 0.00–0.07)
Basophils Absolute: 0.1 10*3/uL (ref 0.0–0.1)
Basophils Relative: 1 %
Eosinophils Absolute: 0.2 10*3/uL (ref 0.0–0.5)
Eosinophils Relative: 3 %
HCT: 35.1 % — ABNORMAL LOW (ref 39.0–52.0)
Hemoglobin: 10.3 g/dL — ABNORMAL LOW (ref 13.0–17.0)
Immature Granulocytes: 2 %
Lymphocytes Relative: 17 %
Lymphs Abs: 1.4 10*3/uL (ref 0.7–4.0)
MCH: 28.5 pg (ref 26.0–34.0)
MCHC: 29.3 g/dL — ABNORMAL LOW (ref 30.0–36.0)
MCV: 97 fL (ref 80.0–100.0)
Monocytes Absolute: 1 10*3/uL (ref 0.1–1.0)
Monocytes Relative: 12 %
Neutro Abs: 5.4 10*3/uL (ref 1.7–7.7)
Neutrophils Relative %: 65 %
Platelets: 183 10*3/uL (ref 150–400)
RBC: 3.62 MIL/uL — ABNORMAL LOW (ref 4.22–5.81)
RDW: 18.5 % — ABNORMAL HIGH (ref 11.5–15.5)
WBC: 8.1 10*3/uL (ref 4.0–10.5)
nRBC: 0.5 % — ABNORMAL HIGH (ref 0.0–0.2)

## 2022-10-07 LAB — GLUCOSE, CAPILLARY
Glucose-Capillary: 105 mg/dL — ABNORMAL HIGH (ref 70–99)
Glucose-Capillary: 107 mg/dL — ABNORMAL HIGH (ref 70–99)

## 2022-10-07 LAB — BASIC METABOLIC PANEL
Anion gap: 15 (ref 5–15)
BUN: 36 mg/dL — ABNORMAL HIGH (ref 6–20)
CO2: 20 mmol/L — ABNORMAL LOW (ref 22–32)
Calcium: 8.4 mg/dL — ABNORMAL LOW (ref 8.9–10.3)
Chloride: 99 mmol/L (ref 98–111)
Creatinine, Ser: 5 mg/dL — ABNORMAL HIGH (ref 0.61–1.24)
GFR, Estimated: 13 mL/min — ABNORMAL LOW (ref >60)
Glucose, Bld: 106 mg/dL — ABNORMAL HIGH (ref 70–99)
Potassium: 5 mmol/L (ref 3.5–5.1)
Sodium: 134 mmol/L — ABNORMAL LOW (ref 135–145)

## 2022-10-07 LAB — MAGNESIUM: Magnesium: 2 mg/dL (ref 1.7–2.4)

## 2022-10-07 NOTE — Discharge Instructions (Signed)
Discuss with Dr. Sharol Given at follow up regarding resuming aspirin and plavix

## 2022-10-07 NOTE — TOC Transition Note (Signed)
Transition of Care [facility]) - CM/SW Discharge Note   Patient Details  Name: Edward [name] MRN: [id number] Date of Birth: 04-12-1971  [facility] of [facility]) CM/SW Contact:  Pollie Friar, RN Phone Number: 10/07/2022, 9:59 AM   Clinical Narrative:    Pt is discharging home with resumption of home health services through [facility]. Orders in place and information is on the AVS.  Pt requiring PTAR home. CSW has verified pts address and will arrange for ambulance transport.    Final next level of care: Home w Home Health Services Barriers to Discharge: No Barriers Identified   Patient Goals and CMS Choice CMS Medicare.gov Compare Post Acute Care list provided to:: Patient Choice offered to / list presented to : Patient  Discharge Placement                         Discharge Plan and Services Additional resources added to the After Visit Summary for                            HH Arranged: RN, PT [facility] Agency: [place] Date [place]: 10/07/22   Representative spoke with at [place]: [place]) Interventions SDOH Screenings   Food Insecurity: No Food Insecurity (08/10/2022)  Housing: Low Risk  (08/10/2022)  Transportation Needs: No Transportation Needs (08/10/2022)  Utilities: Not At Risk (08/10/2022)  Depression (PHQ2-9): Medium Risk (06/12/2019)  Tobacco Use: Medium Risk (10/05/2022)     Readmission Risk Interventions    09/23/2022   10:29 AM 04/02/2022   10:33 AM 01/14/2022   12:26 PM  Readmission Risk Prevention Plan  Transportation Screening Complete Complete Complete  PCP or Specialist Appt within 3-5 Days   Complete  HRI or [place]   Complete  Social Work Consult for [place] Planning/Counseling   Complete  Palliative Care Screening   Not Applicable  Medication Review Press photographer) Complete Complete Referral to Pharmacy  PCP or Specialist appointment within 3-5  days of discharge Complete Complete   HRI or [place] Complete Complete   SW Recovery Care/Counseling Consult Complete Complete   Palliative Care Screening Not Complete Not [place] Not Applicable Not Applicable

## 2022-10-07 NOTE — Consult Note (Signed)
ORTHOPAEDIC CONSULTATION  REQUESTING PHYSICIAN: Dwyane Dee, MD  Chief Complaint: Bloody drainage left AKA.  HPI: Edward [name] is a 52 y.o. male who presents with patient is 2 weeks status post left above-the-knee amputation.  Recent bloody drainage has been noticed.  Patient is on both aspirin and Plavix.  Past Medical History:  Diagnosis Date   Allergy    Anemia    getting Venofer with dialysis   Anxiety    self reported, sometimes-situational anxiety   Arthritis    bilateral hands/LEFT hip   CHF (congestive heart failure) (HCC)    Chronic kidney disease    t th s dialysis- [place] ---ESRD   Coronary artery disease    Depression    self reported, sometimes-situational depression   Diabetes mellitus    type II- on meds   Dyspnea    sometimes- fluid   GERD (gastroesophageal reflux disease)    on meds   History of blood transfusion    Hypertension    no longer on medication since starting on Hemodialysis   Myocardial infarction ([place]) 2019   Nausea and vomiting    Peripheral vascular disease ([place])    Sleep apnea    does not use CPAP   Past Surgical History:  Procedure Laterality Date   A/V FISTULAGRAM Left 03/06/2020   Procedure: A/V FISTULAGRAM;  Surgeon: Waynetta Sandy, MD;  Location: [facility];  Service: Cardiovascular;  Laterality: Left;   ABDOMINAL AORTOGRAM N/A 01/07/2022   Procedure: ABDOMINAL AORTOGRAM;  Surgeon: Waynetta Sandy, MD;  Location: [facility];  Service: Cardiovascular;  Laterality: N/A;   ABDOMINAL AORTOGRAM W/LOWER EXTREMITY N/A 04/09/2021   Procedure: ABDOMINAL AORTOGRAM W/LOWER EXTREMITY;  Surgeon: Waynetta Sandy, MD;  Location: [facility];  Service: Cardiovascular;  Laterality: N/A;   AMPUTATION Left 05/10/2016   Procedure: Left Transmetatarsal Amputation;  Surgeon: [name], MD;  Location: [facility];  Service: Orthopedics;  Laterality: Left;   AMPUTATION Right 05/16/2018    Procedure: PARTIAL RAY AMPUTATION FIFTH TOE RIGHT FOOT;  Surgeon: [name], DPM;  Location: [place] [facility];  Service: Podiatry;  Laterality: Right;   AMPUTATION Right 06/17/2018   Procedure: AMPUTATION 4th RAY Right Foot;  Surgeon: Trula Slade, DPM;  Location: [facility];  Service: Podiatry;  Laterality: Right;   AMPUTATION Right 06/19/2018   Procedure: RIGHT BELOW KNEE AMPUTATION;  Surgeon: [name], MD;  Location: [facility];  Service: Orthopedics;  Laterality: Right;   AMPUTATION Left 10/05/2021   Procedure: LEFT RING FINGER AMPUTATION;  Surgeon: Daryll Brod, MD;  Location: [facility];  Service: Orthopedics;  Laterality: Left;  45 MIN   AMPUTATION Left 11/15/2021   Procedure: LEFT INDEX FINGER AMPUTATION;  Surgeon: Leanora Cover, MD;  Location: [facility];  Service: Orthopedics;  Laterality: Left;  60 MIN   AMPUTATION Right 01/08/2022   Procedure: AMPUTATION ABOVE KNEE;  Surgeon: Waynetta Sandy, MD;  Location: [facility];  Service: Vascular;  Laterality: Right;   AMPUTATION Bilateral 01/12/2022   Procedure: LEFT INDEX FINGER AMPUTATION, RIGHT INDEX, LONG AND SMALL DIGITS AMPUTATION;  Surgeon: Leanora Cover, MD;  Location: [facility] [place];  Service: Orthopedics;  Laterality: Bilateral;   AMPUTATION Right 04/01/2022   Procedure: RIGHT INDEX FINGER AMPUTATION AT METACARPAL PHALANGEAL JOINT, INCISION AND DRAINAGE RIGHT HAND;  Surgeon: Leanora Cover, MD;  Location: [place];  Service: Orthopedics;  Laterality: Right;  90 MIN   AMPUTATION Right 05/23/2022   Procedure: RIGHT INDEX FINGER RAY  AMPUTATION; RIGHT LONG, RING, AND SMALL FINGER AMPUTATIONS;  Surgeon: Leanora Cover, MD;  Location: [facility];  Service: Orthopedics;  Laterality: Right;  90 MIN   AMPUTATION Left 08/07/2022   Procedure: LEFT ABOVE KNEE AMPUTATION;  Surgeon: [name], MD;  Location: [facility];  Service: Orthopedics;  Laterality: Left;   AORTIC ARCH ANGIOGRAPHY N/A 01/07/2022   Procedure: AORTIC ARCH ANGIOGRAPHY;  Surgeon: Waynetta Sandy, MD;   Location: [facility];  Service: Cardiovascular;  Laterality: N/A;   APPLICATION OF WOUND VAC Right 06/19/2018   Procedure: APPLICATION OF WOUND VAC;  Surgeon: [name], MD;  Location: [facility];  Service: Orthopedics;  Laterality: Right;   APPLICATION OF WOUND VAC Left 08/07/2022   Procedure: APPLICATION OF WOUND VAC;  Surgeon: [name], MD;  Location: [facility];  Service: Orthopedics;  Laterality: Left;   AV FISTULA PLACEMENT Left 11/25/2019   Procedure: LEFT ARM Brachiocephalic  ARTERIOVENOUS (AV) FISTULA CREATION;  Surgeon: Rosetta Posner, MD;  Location: [facility];  Service: Vascular;  Laterality: Left;   [place] Left 07/19/2020   Procedure: LEFT SECOND STAGE [place];  Surgeon: Waynetta Sandy, MD;  Location: [facility];  Service: Vascular;  Laterality: Left;   CIRCUMCISION  09/02/2011   Procedure: CIRCUMCISION ADULT;  Surgeon: Molli Hazard, MD;  Location: [facility];  Service: Urology;  Laterality: N/A;   CORONARY STENT INTERVENTION N/A 07/29/2018   Procedure: CORONARY STENT INTERVENTION;  Surgeon: Leonie Man, MD;  Location: [facility];  Service: Cardiovascular;  Laterality: N/A;   DEBRIDEMENT AND CLOSURE WOUND Left 11/15/2021   Procedure: LEFT HAND WOUND CLOSURE;  Surgeon: Leanora Cover, MD;  Location: [facility];  Service: Orthopedics;  Laterality: Left;   I & D EXTREMITY  09/02/2011   Procedure: IRRIGATION AND DEBRIDEMENT EXTREMITY;  Surgeon: Theodoro Kos, DO;  Location: [facility];  Service: Plastics;  Laterality: N/A;   INCISION AND DRAINAGE OF WOUND  08/12/2011   Procedure: IRRIGATION AND DEBRIDEMENT WOUND;  Surgeon: Zenovia Jarred, MD;  Location: [facility];  Service: General;;  perineum   INSERTION OF DIALYSIS CATHETER N/A 11/25/2019   Procedure: INSERTION OF [place] Internal Jugular DIALYSIS CATHETER;  Surgeon: Rosetta Posner, MD;  Location: [facility];  Service: Vascular;  Laterality: N/A;    [place]  08/12/2011   Procedure: IRRIGATION AND DEBRIDEMENT ABSCESS;  Surgeon: Molli Hazard, MD;  Location: [facility];  Service: Urology;  Laterality: N/A;  irrigation and debridment perineum     IRRIGATION AND DEBRIDEMENT ABSCESS  08/14/2011   Procedure: MINOR INCISION AND DRAINAGE OF ABSCESS;  Surgeon: Molli Hazard, MD;  Location: [facility];  Service: Urology;  Laterality: N/A;  Perineal Wound Debridement;Placement Bilateral Testicular Thigh Pouches   LEFT HEART CATH AND CORONARY ANGIOGRAPHY N/A 07/29/2018   Procedure: LEFT HEART CATH AND CORONARY ANGIOGRAPHY;  Surgeon: Leonie Man, MD;  Location: [facility];  Service: Cardiovascular;  Laterality: N/A;   LOWER EXTREMITY ANGIOGRAPHY Bilateral 01/07/2022   Procedure: Lower Extremity Angiography;  Surgeon: Waynetta Sandy, MD;  Location: [facility];  Service: Cardiovascular;  Laterality: Bilateral;   PERIPHERAL VASCULAR ATHERECTOMY Left 04/09/2021   Procedure: PERIPHERAL VASCULAR ATHERECTOMY;  Surgeon: Waynetta Sandy, MD;  Location: [facility];  Service: Cardiovascular;  Laterality: Left;  TP trunk and peroneal arteries   PERIPHERAL VASCULAR BALLOON ANGIOPLASTY Left 04/09/2021   Procedure: PERIPHERAL VASCULAR BALLOON ANGIOPLASTY;  Surgeon: Waynetta Sandy, MD;  Location: [facility];  Service: Cardiovascular;  Laterality: Left;  Popliteal   REVISON OF ARTERIOVENOUS FISTULA Left 05/24/2020   Procedure: LEFT ARM ARTERIOVENOUS FISTULA  CONVERSION TO BASILIC VEIN FISTULA;  Surgeon: Waynetta Sandy, MD;  Location: [facility];  Service: Vascular;  Laterality: Left;   STUMP REVISION Left 09/20/2022   Procedure: LEFT ABOVE THE KNEE AMPUTATION REVISION;  Surgeon: [name], MD;  Location: [facility];  Service: Orthopedics;  Laterality: Left;   TOTAL HIP ARTHROPLASTY Left 10/11/2016   Procedure: LEFT TOTAL HIP ARTHROPLASTY ANTERIOR APPROACH;  Surgeon:  Mcarthur Rossetti, MD;  Location: [facility];  Service: Orthopedics;  Laterality: Left;   UPPER EXTREMITY ANGIOGRAPHY Bilateral 01/07/2022   Procedure: Upper Extremity Angiography;  Surgeon: Waynetta Sandy, MD;  Location: [facility];  Service: Cardiovascular;  Laterality: Bilateral;   Social History   Socioeconomic History   Marital status: Widowed    Spouse name: Not on file   Number of children: Not on file   Years of education: Not on file   Highest education level: Not on file  Occupational History   Occupation: Disabled  Tobacco Use   Smoking status: Former    Packs/day: 0.25    Years: 24.00    Total pack years: 6.00    Types: Cigarettes    Quit date: 11/19/2019    Years since quitting: 2.8   Smokeless tobacco: Never  Vaping Use   Vaping Use: Never used  Substance and Sexual Activity   Alcohol use: Not Currently    Alcohol/week: 0.0 standard drinks of alcohol    Comment: quit 2018   Drug use: No   Sexual activity: Not Currently    Birth control/protection: Condom  Other Topics Concern   Not on file  Social History Narrative   Lives w/ his 2 sons.   Social Determinants of Health   Financial Resource Strain: Not on file  Food Insecurity: No Food Insecurity (08/10/2022)   Hunger Vital Sign    Worried About Running Out of Food in the Last Year: Never true    Ran Out of Food in the Last Year: Never true  Transportation Needs: No Transportation Needs (08/10/2022)   PRAPARE - Hydrologist (Medical): No    Lack of Transportation (Non-Medical): No  Physical Activity: Not on file  Stress: Not on file  Social Connections: Not on file   Family History  Problem Relation Age of Onset   Diabetes Other    Pancreatic cancer Mother 66   Colon cancer Neg Hx    Colon polyps Neg Hx    Esophageal cancer Neg Hx    Rectal cancer Neg Hx    Stomach cancer Neg Hx    - negative except otherwise stated in the family history  section Allergies  Allergen Reactions   Crestor [Rosuvastatin] Rash    Eye redness   Dilaudid [Hydromorphone] Itching   Robaxin [Methocarbamol] Other (See Comments)    Pt states medication made his arm pain worse.   Benadryl [Diphenhydramine] Rash   Prior to Admission medications   Medication Sig Start Date End Date Taking? Authorizing Provider  acetaminophen (TYLENOL) 325 MG tablet Take 1-2 tablets (325-650 mg total) by mouth every 6 (six) hours as needed for mild pain (pain score 1-3 or temp > 100.5). 08/22/22  Yes Angiulli, Lavon Paganini, PA-C  ALPRAZolam Duanne Moron) 0.25 MG tablet Take 1 tablet (0.25 mg total) by  mouth daily. 08/22/22  Yes Angiulli, Lavon Paganini, PA-C  aspirin EC 81 MG tablet Take 81 mg by mouth daily. Swallow whole.   Yes [provider]  B Complex-C (B-COMPLEX WITH VITAMIN C) tablet Take by mouth. 08/23/22  Yes [provider]  Doxercalciferol (HECTOROL IV) Doxercalciferol (Hectorol) 08/24/22 08/23/23 Yes [provider]  gabapentin (NEURONTIN) 100 MG capsule Take 1 capsule (100 mg total) by mouth 3 (three) times daily. Patient taking differently: Take 100 mg by mouth 2 (two) times daily. 08/22/22  Yes Angiulli, Lavon Paganini, PA-C  Insulin Lispro Prot & Lispro (HUMALOG 75/25 MIX) (75-25) 100 UNIT/ML Kwikpen Inject 25-30 Units into the skin daily. 09/04/22  Yes [provider]  linaclotide (LINZESS) 72 MCG capsule Take 1 capsule (72 mcg total) by mouth in the morning. 08/22/22  Yes Angiulli, Lavon Paganini, PA-C  Methoxy PEG-Epoetin Beta (MIRCERA IJ) Mircera 08/27/22 08/26/23 Yes [provider]  midodrine (PROAMATINE) 10 MG tablet Take by mouth. 08/23/22  Yes [provider]  oxyCODONE (OXY IR/ROXICODONE) 5 MG immediate release tablet Take 1 tablet (5 mg total) by mouth every 4 (four) hours as needed for severe pain. 09/23/22  Yes [name], MD  pantoprazole (PROTONIX) 40 MG tablet Take 1 tablet (40 mg total) by mouth daily. 08/22/22  Yes Angiulli, Lavon Paganini,  PA-C  allopurinol (ZYLOPRIM) 100 MG tablet Take by mouth. Patient not taking: Reported on 10/05/2022 08/30/21   [provider]  ascorbic acid (VITAMIN C) 1000 MG tablet Take 1 tablet (1,000 mg total) by mouth daily. Patient not taking: Reported on 10/05/2022 08/22/22   Angiulli, Lavon Paganini, PA-C  atorvastatin (LIPITOR) 80 MG tablet Take 1 tablet (80 mg total) by mouth daily. Patient not taking: Reported on 10/05/2022 08/22/22   Angiulli, Lavon Paganini, PA-C  clopidogrel (PLAVIX) 75 MG tablet Take 1 tablet (75 mg total) by mouth daily. Patient not taking: Reported on 10/05/2022 08/22/22   Angiulli, Lavon Paganini, PA-C  ezetimibe (ZETIA) 10 MG tablet Take 1 tablet (10 mg total) by mouth daily. Patient not taking: Reported on 10/05/2022 08/22/22   Angiulli, Lavon Paganini, PA-C  linagliptin (TRADJENTA) 5 MG TABS tablet Take by mouth. 08/23/22   [provider]  sodium bicarbonate 650 MG tablet Take by mouth. Patient not taking: Reported on 10/05/2022 08/23/22   [provider]  sucroferric oxyhydroxide (VELPHORO) 500 MG chewable tablet Chew 2 tablets (1,000 mg total) by mouth 3 (three) times daily with meals. Patient not taking: Reported on 10/05/2022 08/22/22   Angiulli, Lavon Paganini, PA-C  zinc sulfate 220 (50 Zn) MG capsule Take by mouth. Patient not taking: Reported on 10/05/2022 08/23/22   [provider]   No results found. - pertinent xrays, CT, MRI studies were reviewed and independently interpreted  Positive ROS: All other systems have been reviewed and were otherwise negative with the exception of those mentioned in the HPI and as above.  Physical Exam: General: Alert, no acute distress Psychiatric: Patient is competent for consent with normal mood and affect Lymphatic: No axillary or cervical lymphadenopathy Cardiovascular: No pedal edema Respiratory: No cyanosis, no use of accessory musculature GI: No organomegaly, abdomen is soft and  non-tender    Images:  @ENCIMAGES$ @  Labs:  Lab Results  Component Value Date   HGBA1C 6.3 (H) 05/10/2022   HGBA1C 6.5 (H) 01/07/2022   HGBA1C 8.6 (H) 10/05/2021   ESRSEDRATE 128 (H) 06/15/2018   ESRSEDRATE 47 (H) 05/07/2016   CRP 21.3 (H) 01/12/2022   CRP  27.8 (H) 01/11/2022   CRP 25.5 (H) 01/10/2022   REPTSTATUS 09/25/2022 FINAL 09/20/2022   GRAMSTAIN NO WBC SEEN NO ORGANISMS SEEN  09/20/2022   CULT  09/20/2022    RARE ENTEROCOCCUS FAECIUM VANCOMYCIN RESISTANT ENTEROCOCCUS NO ANAEROBES ISOLATED Performed at [facility], [place] [address], [place], [place] [address]    LABORGA ENTEROCOCCUS FAECIUM 09/20/2022    Lab Results  Component Value Date   ALBUMIN 2.4 (L) 10/05/2022   ALBUMIN 2.5 (L) 08/22/2022   ALBUMIN 2.5 (L) 08/19/2022        Latest Ref Rng & Units 10/07/2022    1:17 AM 10/06/2022    6:21 AM 10/05/2022    5:20 PM  CBC EXTENDED  WBC 4.0 - 10.5 K/uL 8.1  7.0    RBC 4.22 - 5.81 MIL/uL 3.62  3.50    Hemoglobin 13.0 - 17.0 g/dL 10.3  9.9  10.9   HCT 39.0 - 52.0 % 35.1  34.0  32.0   Platelets 150 - 400 K/uL 183  210    NEUT# 1.7 - 7.7 K/uL 5.4     Lymph# 0.7 - 4.0 K/uL 1.4       Neurologic: Patient does not have protective sensation bilateral lower extremities.   MUSCULOSKELETAL:   Skin: Examination there is no redness no cellulitis no ischemic changes of the left above-knee amputation.  There is some dried bloody drainage.  There is no wound dehiscence.  Hemoglobin is stable at 10.3.  White blood cell count 8.1.  Albumin 2.4.  Assessment: Assessment bloody drainage left above-knee amputation with patient both on Plavix and aspirin with no signs of infection or ischemic changes.  Plan: Plan: Would continue with the dry dressing change.  Thank you for the consult and the opportunity to see Mr. Aleen Sells, [place] [phone] 7:18 AM

## 2022-10-07 NOTE — Discharge Summary (Signed)
Physician Discharge Summary   Edward [name] [id number] DOB: 08-24-1970 DOA: 10/05/2022  PCP: [name], MD  Admit date: 10/05/2022 Discharge date: 10/07/2022  Admitted From: Home Disposition:  Home Discharging physician: Dwyane Dee, MD Barriers to discharge: none  Recommendations for Outpatient Follow-up:  Follow up with orthopedic surgery   Discharge Condition: stable CODE STATUS: Full Diet recommendation:  Diet Orders (From admission, onward)     Start     Ordered   10/05/22 2202  Diet renal/carb modified with fluid restriction Diet-HS Snack? Nothing; Fluid restriction: 1200 mL Fluid; Room service appropriate? Yes; Fluid consistency: Thin  Diet effective now       Question Answer Comment  Diet-HS Snack? Nothing   Fluid restriction: 1200 mL Fluid   Room service appropriate? Yes   Fluid consistency: Thin      10/05/22 2202            Hospital Course: Edward [name] is a 52 y.o. male with PMH ESRD on TTS HD, CAD s/p DES (LCx 07/2018), chronic combined systolic and diastolic CHF (EF [id number]), severe mitral and tricuspid valve regurgitation, PVD (s/p right BKA, left AKA, multiple finger amputations), chronic hypotension, T2DM, HLD, anemia of chronic disease who is admitted with ongoing bleeding from left AKA stump site.  There was concern for potential dehiscence and he was referred to the hospital for admission after completing HD on 2/17.  He was evaluated by orthopedic surgery during hospitalization.  There was no concern or evidence of dehiscence nor any underlying signs of infection.  He was recommended to continue wound care as being done at home and will also follow-up outpatient with orthopedic surgery.  Assessment and Plan: * S/P AKA (above knee amputation), left ([place]) - Initially underwent left AKA on 07/2022 followed by revision on 09/20/2022 due to bleeding from surgical site.  Patient continues to have ongoing mild oozing of blood from surgical site. -  There was concern for wound dehiscence while patient was in dialysis on 10/05/2022 and he was therefore referred to the hospital for orthopedic evaluation - Currently no signs/symptoms of dehiscence or underlying cellulitis/infection.  Appreciate orthopedic evaluation - Continue wound care and dressing in place; patient stable for discharging home with outpatient follow-up with orthopedic surgery  Elevated troponin - Initially elevated troponin on admission with some downtrend.  No chest pain complaints.  Elevation likely combination of underlying demand ischemia from some hypotension but also elevation in setting of ESRD -Patient and his son state that he has not been on aspirin and Plavix recently outpatient  ESRD on hemodialysis ([place]) - HD on TTSa, last session 2/17 - suspect should be discharged before needing HD again  Chronic hypotension - Blood pressure currently stable - Continue midodrine  CAD S/P percutaneous coronary angioplasty - Continue statin -Patient states he has not been on aspirin and Plavix as it was discontinued outpatient  Chronic combined systolic and diastolic CHF (congestive heart failure) (HCC) - No signs/symptoms of exacerbation -No diuretics.  Volume managed with dialysis -Prior echo from 09/27/2020 reviewed: EF 50 to 55%, Gr 2 DD. Abnormal septal motion Distal septal / apical hypokinesis   Type 2 diabetes mellitus with chronic kidney disease, without long-term current use of insulin (HCC) - Resume home regimen  Anemia of chronic disease - Baseline hemoglobin 10 g/dL - Currently at baseline   The patient's chronic medical conditions were treated accordingly per the patient's home medication regimen except as noted.  On day of discharge, patient was felt deemed  stable for discharge. Patient/family member advised to call PCP or come back to ER if needed.   Principal Diagnosis: S/P AKA (above knee amputation), left [facility])  Discharge Diagnoses: Active  Hospital Problems   Diagnosis Date Noted   S/P AKA (above knee amputation), left ([place]) 09/20/2022    Priority: 1.   Elevated troponin 10/05/2022    Priority: 2.   ESRD on hemodialysis ([place]) 06/22/2020    Priority: 3.   Chronic hypotension 10/05/2022   CAD S/P percutaneous coronary angioplasty 08/06/2018   Chronic combined systolic and diastolic CHF (congestive heart failure) ([place])    Type 2 diabetes mellitus with chronic kidney disease, without long-term current use of insulin (HCC)    Anemia of chronic disease     Resolved Hospital Problems  No resolved problems to display.     Discharge Instructions     Discharge wound care:   Complete by: As directed    Resume home wound care as previously being done   Increase activity slowly   Complete by: As directed       Allergies as of 10/07/2022       Reactions   Crestor [rosuvastatin] Rash   Eye redness   Dilaudid [hydromorphone] Itching   Robaxin [methocarbamol] Other (See Comments)   Pt states medication made his arm pain worse.   Benadryl [diphenhydramine] Rash        Medication List     STOP taking these medications    allopurinol 100 MG tablet Commonly known as: ZYLOPRIM   aspirin EC 81 MG tablet   clopidogrel 75 MG tablet Commonly known as: PLAVIX   sodium bicarbonate 650 MG tablet   vitamin C 1000 MG tablet   zinc sulfate 220 (50 Zn) MG capsule       TAKE these medications    acetaminophen 325 MG tablet Commonly known as: TYLENOL Take 1-2 tablets (325-650 mg total) by mouth every 6 (six) hours as needed for mild pain (pain score 1-3 or temp > 100.5).   ALPRAZolam 0.25 MG tablet Commonly known as: XANAX Take 1 tablet (0.25 mg total) by mouth daily.   atorvastatin 80 MG tablet Commonly known as: LIPITOR Take 1 tablet (80 mg total) by mouth daily.   B-complex with vitamin C tablet Take by mouth.   ezetimibe 10 MG tablet Commonly known as: ZETIA Take 1 tablet (10 mg total) by mouth  daily.   gabapentin 100 MG capsule Commonly known as: NEURONTIN Take 1 capsule (100 mg total) by mouth 3 (three) times daily. What changed: when to take this   HECTOROL IV Doxercalciferol (Hectorol)   Insulin Lispro Prot & Lispro (75-25) 100 UNIT/ML Kwikpen Commonly known as: HUMALOG 75/25 MIX Inject 25-30 Units into the skin daily.   Linzess 72 MCG capsule Generic drug: linaclotide Take 1 capsule (72 mcg total) by mouth in the morning.   midodrine 10 MG tablet Commonly known as: PROAMATINE Take by mouth.   MIRCERA IJ Mircera   oxyCODONE 5 MG immediate release tablet Commonly known as: Oxy IR/ROXICODONE Take 1 tablet (5 mg total) by mouth every 4 (four) hours as needed for severe pain.   pantoprazole 40 MG tablet Commonly known as: PROTONIX Take 1 tablet (40 mg total) by mouth daily.   Tradjenta 5 MG Tabs tablet Generic drug: linagliptin Take by mouth.   Velphoro 500 MG chewable tablet Generic drug: sucroferric oxyhydroxide Chew 2 tablets (1,000 mg total) by mouth 3 (three) times daily with meals.  Discharge Care Instructions  (From admission, onward)           Start     Ordered   10/07/22 0000  Discharge wound care:       Comments: Resume home wound care as previously being done   10/07/22 0827            Follow-up Information     [facility] Follow up.   Why: [phone] The home health agency will contact you for the next home visit               Allergies  Allergen Reactions   Crestor [Rosuvastatin] Rash    Eye redness   Dilaudid [Hydromorphone] Itching   Robaxin [Methocarbamol] Other (See Comments)    Pt states medication made his arm pain worse.   Benadryl [Diphenhydramine] Rash    Consultations: Orthopedic surgery  Procedures:   Discharge Exam: BP 97/73 (BP Location: Right Arm)   Pulse 91   Temp 98.2 F (36.8 C) (Oral)   Resp 18   Wt 69.8 kg   SpO2 92%   BMI 48.47 kg/m  Physical  Exam Constitutional:      General: He is not in acute distress.    Appearance: Normal appearance.  HENT:     Head: Normocephalic and atraumatic.     Mouth/Throat:     Mouth: Mucous membranes are moist.  Eyes:     Extraocular Movements: Extraocular movements intact.  Cardiovascular:     Rate and Rhythm: Normal rate and regular rhythm.  Pulmonary:     Effort: Pulmonary effort is normal. No respiratory distress.     Breath sounds: Normal breath sounds. No wheezing.  Abdominal:     General: Bowel sounds are normal. There is no distension.     Palpations: Abdomen is soft.     Tenderness: There is no abdominal tenderness.  Musculoskeletal:     Cervical back: Normal range of motion and neck supple.     Comments: B/L AKA noted; left stump has staples in place with mild weeping of blood noted; no purulent drainage and no TTP nor erythema/edema.  Also noted multiple amputated fingers on both hands with contracted hardened fingers in left hand  Skin:    General: Skin is warm and dry.  Neurological:     General: No focal deficit present.     Mental Status: He is alert.      The results of significant diagnostics from this hospitalization (including imaging, microbiology, ancillary and laboratory) are listed below for reference.   Microbiology: No results found for this or any previous visit (from the past 240 hour(s)).   Labs: BNP (last 3 results) No results for input(s): "BNP" in the last 8760 hours. Basic Metabolic Panel: Recent Labs  Lab 10/05/22 1625 10/05/22 1720 10/06/22 0621 10/07/22 0117  NA 134* 137 137 134*  K 3.5 3.6 3.8 5.0  CL 100 100 100 99  CO2 23  --  23 20*  GLUCOSE 150* 150* 136* 106*  BUN 22* 21* 25* 36*  CREATININE 3.70* 4.00* 4.22* 5.00*  CALCIUM 8.2*  --  8.5* 8.4*  MG  --   --   --  2.0   Liver Function Tests: Recent Labs  Lab 10/05/22 1625  AST 18  ALT 6  ALKPHOS 94  BILITOT 1.0  PROT 6.1*  ALBUMIN 2.4*   No results for input(s): "LIPASE",  "AMYLASE" in the last 168 hours. No results for input(s): "AMMONIA" in the  last 168 hours. CBC: Recent Labs  Lab 10/05/22 1625 10/05/22 1720 10/06/22 0621 10/07/22 0117  WBC 6.6  --  7.0 8.1  NEUTROABS 4.9  --   --  5.4  HGB 9.8* 10.9* 9.9* 10.3*  HCT 31.8* 32.0* 34.0* 35.1*  MCV 94.6  --  97.1 97.0  PLT 188  --  210 183   Cardiac Enzymes: No results for input(s): "CKTOTAL", "CKMB", "CKMBINDEX", "TROPONINI" in the last 168 hours. BNP: Invalid input(s): "POCBNP" CBG: Recent Labs  Lab 10/06/22 1214 10/06/22 1629 10/06/22 2151 10/07/22 0610 10/07/22 1134  GLUCAP 126* 120* 95 105* 107*   D-Dimer No results for input(s): "DDIMER" in the last 72 hours. Hgb A1c No results for input(s): "HGBA1C" in the last 72 hours. Lipid Profile No results for input(s): "CHOL", "HDL", "LDLCALC", "TRIG", "CHOLHDL", "LDLDIRECT" in the last 72 hours. Thyroid function studies No results for input(s): "TSH", "T4TOTAL", "T3FREE", "THYROIDAB" in the last 72 hours.  Invalid input(s): "FREET3" Anemia work up No results for input(s): "VITAMINB12", "FOLATE", "FERRITIN", "TIBC", "IRON", "RETICCTPCT" in the last 72 hours. Urinalysis    Component Value Date/Time   COLORURINE YELLOW 09/06/2021 0748   APPEARANCEUR CLEAR 09/06/2021 0748   LABSPEC >1.030 (H) 09/06/2021 0748   PHURINE 5.5 09/06/2021 0748   GLUCOSEU 100 (A) 09/06/2021 0748   HGBUR MODERATE (A) 09/06/2021 0748   BILIRUBINUR NEGATIVE 09/06/2021 0748   KETONESUR NEGATIVE 09/06/2021 0748   PROTEINUR >300 (A) 09/06/2021 0748   UROBILINOGEN 0.2 05/04/2014 1002   NITRITE NEGATIVE 09/06/2021 0748   LEUKOCYTESUR TRACE (A) 09/06/2021 0748   Sepsis Labs Recent Labs  Lab 10/05/22 1625 10/06/22 0621 10/07/22 0117  WBC 6.6 7.0 8.1   Microbiology No results found for this or any previous visit (from the past 240 hour(s)).  Procedures/Studies: No results found.   Time coordinating discharge: Over 30 minutes    Dwyane Dee,  MD  [facility] 10/07/2022, 12:09 PM

## 2022-10-07 NOTE — Progress Notes (Signed)
Discharge instructions reviewed with pt.  Copy of instructions given to pt. No new scripts. Pt's HH to be resumed per 2/18 CM note and per instructions on AVS d/c instructions.   Pt needs transportation home (B AKA), SW working on arranging PTAR  at this time, [phone].  Pt's RN to make sure pt's chair pad that was ordered by [facility] RN goes with pt.

## 2022-10-07 NOTE — Consult Note (Signed)
WOC Nurse Consult Note: Reason for Consult: sacral and buttocks wounds  Wound type: Pressure  Pressure Injury POA: Yes Measurement: 1.  Stage 2 Pressure Injury Sacrum 0.5 cms x 1 cm x 0.1  2.  L ischium Stage 2 Pressure Injury 0.5 cms x 0.2 cms x 0.1 cms  Wound bed: 100% pink  Drainage (amount, consistency, odor) none  Periwound: old scar tissue to sacrum  Dressing procedure/placement/frequency: Foam dressing to both Sacrum and Buttocks, change q3 days and prn soiling.    Patient on low air loss mattress for pressure redistribution and moisture management.  Patient up in wheelchair most of the day at home.   I did order a pressure redistribution cushion [name] [phone]) for wheelchair and when he is up in the recliner here.    POC discussed with patient and bedside nurse.  [place] team will not follow at this time.  Re-consult if further needs arise.    Thank you,    Scott Fix MSN, RN-BC, CWON  :

## 2022-10-08 ENCOUNTER — Telehealth: Payer: Self-pay | Admitting: Orthopedic Surgery

## 2022-10-08 DIAGNOSIS — N2581 Secondary hyperparathyroidism of renal origin: Secondary | ICD-10-CM | POA: Diagnosis not present

## 2022-10-08 DIAGNOSIS — D689 Coagulation defect, unspecified: Secondary | ICD-10-CM | POA: Diagnosis not present

## 2022-10-08 DIAGNOSIS — Z89612 Acquired absence of left leg above knee: Secondary | ICD-10-CM | POA: Diagnosis not present

## 2022-10-08 DIAGNOSIS — E1152 Type 2 diabetes mellitus with diabetic peripheral angiopathy with gangrene: Secondary | ICD-10-CM | POA: Diagnosis not present

## 2022-10-08 DIAGNOSIS — N186 End stage renal disease: Secondary | ICD-10-CM | POA: Diagnosis not present

## 2022-10-08 DIAGNOSIS — R197 Diarrhea, unspecified: Secondary | ICD-10-CM | POA: Diagnosis not present

## 2022-10-08 DIAGNOSIS — I5042 Chronic combined systolic (congestive) and diastolic (congestive) heart failure: Secondary | ICD-10-CM | POA: Diagnosis not present

## 2022-10-08 DIAGNOSIS — Z4781 Encounter for orthopedic aftercare following surgical amputation: Secondary | ICD-10-CM | POA: Diagnosis not present

## 2022-10-08 DIAGNOSIS — R52 Pain, unspecified: Secondary | ICD-10-CM | POA: Diagnosis not present

## 2022-10-08 DIAGNOSIS — L299 Pruritus, unspecified: Secondary | ICD-10-CM | POA: Diagnosis not present

## 2022-10-08 DIAGNOSIS — I132 Hypertensive heart and chronic kidney disease with heart failure and with stage 5 chronic kidney disease, or end stage renal disease: Secondary | ICD-10-CM | POA: Diagnosis not present

## 2022-10-08 DIAGNOSIS — Z992 Dependence on renal dialysis: Secondary | ICD-10-CM | POA: Diagnosis not present

## 2022-10-08 DIAGNOSIS — T8249XD Other complication of vascular dialysis catheter, subsequent encounter: Secondary | ICD-10-CM | POA: Diagnosis not present

## 2022-10-08 DIAGNOSIS — E1129 Type 2 diabetes mellitus with other diabetic kidney complication: Secondary | ICD-10-CM | POA: Diagnosis not present

## 2022-10-08 DIAGNOSIS — D631 Anemia in chronic kidney disease: Secondary | ICD-10-CM | POA: Diagnosis not present

## 2022-10-08 DIAGNOSIS — I70262 Atherosclerosis of native arteries of extremities with gangrene, left leg: Secondary | ICD-10-CM | POA: Diagnosis not present

## 2022-10-08 NOTE — Telephone Encounter (Signed)
Patient's son called. Says he is having a lot of bleeding. Given 10000 units of heparin at dialysis. His call back number is [phone]

## 2022-10-08 NOTE — Telephone Encounter (Signed)
Elmyra Ricks from [place] [id number]  needs a plan of care for patient.

## 2022-10-08 NOTE — Telephone Encounter (Signed)
Pt's son, Fannie Knee, informed.

## 2022-10-08 NOTE — Telephone Encounter (Signed)
I SW pt's son, Edward [name], he says that pt is getting 10,000 units of heparin each time he goes to dialysis. He has been having a lot of bleeding on top of this. He goes to dialysis twice a week. Dialysis clinic stated if Dr. Sharol Given can send them an order to lower the dosage of the heparin to help control the bleeding? Please advise.

## 2022-10-09 DIAGNOSIS — I70262 Atherosclerosis of native arteries of extremities with gangrene, left leg: Secondary | ICD-10-CM | POA: Diagnosis not present

## 2022-10-09 DIAGNOSIS — Z4781 Encounter for orthopedic aftercare following surgical amputation: Secondary | ICD-10-CM | POA: Diagnosis not present

## 2022-10-09 DIAGNOSIS — E1152 Type 2 diabetes mellitus with diabetic peripheral angiopathy with gangrene: Secondary | ICD-10-CM | POA: Diagnosis not present

## 2022-10-09 DIAGNOSIS — I132 Hypertensive heart and chronic kidney disease with heart failure and with stage 5 chronic kidney disease, or end stage renal disease: Secondary | ICD-10-CM | POA: Diagnosis not present

## 2022-10-09 DIAGNOSIS — Z89612 Acquired absence of left leg above knee: Secondary | ICD-10-CM | POA: Diagnosis not present

## 2022-10-09 DIAGNOSIS — I5042 Chronic combined systolic (congestive) and diastolic (congestive) heart failure: Secondary | ICD-10-CM | POA: Diagnosis not present

## 2022-10-09 NOTE — Telephone Encounter (Signed)
They are in the folder for Dr. Sharol Given to sign. I will have [place] sign on his behalf since he is not in office.

## 2022-10-10 DIAGNOSIS — D631 Anemia in chronic kidney disease: Secondary | ICD-10-CM | POA: Diagnosis not present

## 2022-10-10 DIAGNOSIS — D689 Coagulation defect, unspecified: Secondary | ICD-10-CM | POA: Diagnosis not present

## 2022-10-10 DIAGNOSIS — T8249XD Other complication of vascular dialysis catheter, subsequent encounter: Secondary | ICD-10-CM | POA: Diagnosis not present

## 2022-10-10 DIAGNOSIS — R197 Diarrhea, unspecified: Secondary | ICD-10-CM | POA: Diagnosis not present

## 2022-10-10 DIAGNOSIS — R52 Pain, unspecified: Secondary | ICD-10-CM | POA: Diagnosis not present

## 2022-10-10 DIAGNOSIS — E1129 Type 2 diabetes mellitus with other diabetic kidney complication: Secondary | ICD-10-CM | POA: Diagnosis not present

## 2022-10-10 DIAGNOSIS — N2581 Secondary hyperparathyroidism of renal origin: Secondary | ICD-10-CM | POA: Diagnosis not present

## 2022-10-10 DIAGNOSIS — L299 Pruritus, unspecified: Secondary | ICD-10-CM | POA: Diagnosis not present

## 2022-10-10 DIAGNOSIS — Z992 Dependence on renal dialysis: Secondary | ICD-10-CM | POA: Diagnosis not present

## 2022-10-10 DIAGNOSIS — N186 End stage renal disease: Secondary | ICD-10-CM | POA: Diagnosis not present

## 2022-10-14 ENCOUNTER — Encounter: Payer: Self-pay | Admitting: Orthopedic Surgery

## 2022-10-14 ENCOUNTER — Ambulatory Visit (INDEPENDENT_AMBULATORY_CARE_PROVIDER_SITE_OTHER): Payer: 59 | Admitting: Orthopedic Surgery

## 2022-10-14 DIAGNOSIS — T8781 Dehiscence of amputation stump: Secondary | ICD-10-CM

## 2022-10-14 DIAGNOSIS — Z89612 Acquired absence of left leg above knee: Secondary | ICD-10-CM

## 2022-10-14 NOTE — Progress Notes (Signed)
Office Visit Note   Patient: Edward [name]           Date of Birth: 1971/01/05           MRN: [id number] Visit Date: 10/14/2022              Requested by: [name], MD [address] [place],  [place] [id number] PCP: [name], MD  Chief Complaint  Patient presents with   Left Leg - Routine Post Op    09/20/2022 Left AKA revision with Kerecis 38 micro and 500 mg vanc       HPI: Patient is a 52 year old gentleman who is 3 weeks status post revision left above-the-knee amputation.  Patient had placement of 38 cm of Kerecis micro graft and 500 mg of vancomycin.  Patient states there is a small amount of drainage over the medial aspect of the incision.  Assessment & Plan: Visit Diagnoses:  1. Dehiscence of amputation stump of left lower extremity (HCC)   2. H/O above knee amputation, left ([place])     Plan: Will reevaluate in 1 week for removal of the staples.  Recommended continue Dial soap cleansing dry dressing changes daily.  Follow-Up Instructions: Return in about 2 weeks (around 10/28/2022).   Ortho Exam  Patient is alert, oriented, no adenopathy, well-dressed, normal affect, normal respiratory effort. Examination the medial aspect of the wound shows some slight dehiscence it is 3 mm in diameter and 2 cm in length.  There is no cellulitis no ischemic skin edge changes.  The remainder of the wound is well-approximated.  The staples are in place and have not moved.  Imaging: No results found.   Labs: Lab Results  Component Value Date   HGBA1C 6.3 (H) 05/10/2022   HGBA1C 6.5 (H) 01/07/2022   HGBA1C 8.6 (H) 10/05/2021   ESRSEDRATE 128 (H) 06/15/2018   ESRSEDRATE 47 (H) 05/07/2016   CRP 21.3 (H) 01/12/2022   CRP 27.8 (H) 01/11/2022   CRP 25.5 (H) 01/10/2022   REPTSTATUS 09/25/2022 FINAL 09/20/2022   GRAMSTAIN NO WBC SEEN NO ORGANISMS SEEN  09/20/2022   CULT  09/20/2022    RARE ENTEROCOCCUS FAECIUM VANCOMYCIN RESISTANT ENTEROCOCCUS NO ANAEROBES  ISOLATED Performed at [facility], [place] [address] [place], [place] [address]    LABORGA ENTEROCOCCUS FAECIUM 09/20/2022     Lab Results  Component Value Date   ALBUMIN 2.4 (L) 10/05/2022   ALBUMIN 2.5 (L) 08/22/2022   ALBUMIN 2.5 (L) 08/19/2022    Lab Results  Component Value Date   MG 2.0 10/07/2022   MG 2.8 (H) 08/05/2022   MG 1.9 04/05/2022   No results found for: "VD25OH"  No results found for: "PREALBUMIN"    Latest Ref Rng & Units 10/07/2022    1:17 AM 10/06/2022    6:21 AM 10/05/2022    5:20 PM  CBC EXTENDED  WBC 4.0 - 10.5 K/uL 8.1  7.0    RBC 4.22 - 5.81 MIL/uL 3.62  3.50    Hemoglobin 13.0 - 17.0 g/dL 10.3  9.9  10.9   HCT 39.0 - 52.0 % 35.1  34.0  32.0   Platelets 150 - 400 K/uL 183  210    NEUT# 1.7 - 7.7 K/uL 5.4     Lymph# 0.7 - 4.0 K/uL 1.4        There is no height or weight on file to calculate BMI.  Orders:  No orders of the defined types were placed in  this encounter.  No orders of the defined types were placed in this encounter.    Procedures: No procedures performed  Clinical Data: No additional findings.  ROS:  All other systems negative, except as noted in the HPI. Review of Systems  Objective: Vital Signs: There were no vitals taken for this visit.  Specialty Comments:  No specialty comments available.  PMFS History: Patient Active Problem List   Diagnosis Date Noted   Chronic hypotension 10/05/2022   Elevated troponin 10/05/2022   S/P AKA (above knee amputation), left ([place]) 09/20/2022   Dehiscence of amputation stump ([place]) 09/20/2022   S/P bilateral above knee amputation ([place]) 08/13/2022   Atherosclerosis of native arteries of extremities with gangrene, left leg ([place]) 08/06/2022   Diabetic foot ulcer ([place]) 08/05/2022   Pectoralis muscle strain 05/10/2022   Cervical lesion 05/10/2022   Hand abscess 0000000   Acute metabolic encephalopathy    PAD (peripheral artery disease) ([place]) 01/07/2022   Osteomyelitis  of finger of left hand ([place]) 10/04/2021   Junctional rhythm 10/04/2021   Acute cholecystitis 09/27/2020   Elevated LFTs 09/27/2020   TIA (transient ischemic attack) 09/27/2020   Nausea and vomiting    ESRD on hemodialysis ([place]) 06/22/2020   S/P coronary artery stent placement 06/22/2020   Encounter for removal of sutures 03/07/2020   Fluid overload, unspecified 12/29/2019   Hypertensive chronic kidney disease with stage 1 through stage 4 chronic kidney disease, or unspecified chronic kidney disease 11/30/2019   Pruritus, unspecified 11/30/2019   Secondary hyperparathyroidism of renal origin ([place]) 11/30/2019   Pain, unspecified 11/30/2019   Other specified arthritis, unspecified site 11/30/2019   Iron deficiency anemia, unspecified 11/30/2019   End stage renal disease ([place]) 11/30/2019   Encounter for screening for respiratory tuberculosis 11/30/2019   Diarrhea, unspecified 11/30/2019   Coagulation defect, unspecified ([place]) 11/30/2019   Atherosclerotic heart disease of native coronary artery without angina pectoris 11/30/2019   Transaminitis 12/22/2018   Acute exacerbation of CHF (congestive heart failure) ([place]) 12/21/2018   Gait abnormality 09/11/2018   CAD S/P percutaneous coronary angioplasty 08/06/2018   Cardiomyopathy ([place]) 08/06/2018   PVD (peripheral vascular disease) ([place]) 08/06/2018   Shortness of breath    Chronic combined systolic and diastolic CHF (congestive heart failure) ([place])    History of NSTEMI 07/28/2018   Respiratory failure with hypoxia ([place]) 07/28/2018   Acute heart failure ([place]) 07/28/2018   Hypertensive emergency 07/28/2018   S/P BKA (below knee amputation) unilateral, right ([place]) 07/15/2018   Hypoglycemia    Anxiety about health    Labile blood glucose    Essential hypertension    History of transmetatarsal amputation of left foot ([place])    Hypoalbuminemia due to protein-calorie malnutrition ([place])    Type 2 diabetes mellitus with chronic kidney disease,  without long-term current use of insulin ([place])    Acute blood loss anemia    Anemia of chronic disease    Leukocytosis    Tobacco abuse    Hyperkalemia 06/20/2018   Diabetic polyneuropathy associated with type 2 diabetes mellitus ([place])    Severe protein-calorie malnutrition ([place])    AKI (acute kidney injury) ([place]) 06/17/2018   Normocytic anemia 06/17/2018   Viral pharyngitis 01/14/2017   Cigarette smoker 01/14/2017   Avascular necrosis of hip, left ([place]) 10/11/2016   Status post left hip replacement 10/11/2016   S/P transmetatarsal amputation of foot, left ([place]) 07/25/2016   Balance problem 07/25/2016   Fournier's gangrene 08/16/2011   Acute respiratory failure ([place]) 08/16/2011  Septic shock(785.52) 08/16/2011   Diabetes mellitus with complication, with long-term current use of insulin ([place]) 08/16/2011   Past Medical History:  Diagnosis Date   Allergy    Anemia    getting Venofer with dialysis   Anxiety    self reported, sometimes-situational anxiety   Arthritis    bilateral hands/LEFT hip   CHF (congestive heart failure) (HCC)    Chronic kidney disease    t th s dialysis- [place] ---ESRD   Coronary artery disease    Depression    self reported, sometimes-situational depression   Diabetes mellitus    type II- on meds   Dyspnea    sometimes- fluid   GERD (gastroesophageal reflux disease)    on meds   History of blood transfusion    Hypertension    no longer on medication since starting on Hemodialysis   Myocardial infarction ([place]) 2019   Nausea and vomiting    Peripheral vascular disease ([place])    Sleep apnea    does not use CPAP    Family History  Problem Relation Age of Onset   Diabetes Other    Pancreatic cancer Mother 15   Colon cancer Neg Hx    Colon polyps Neg Hx    Esophageal cancer Neg Hx    Rectal cancer Neg Hx    Stomach cancer Neg Hx     Past Surgical History:  Procedure Laterality Date   A/V FISTULAGRAM Left 03/06/2020   Procedure: A/V  FISTULAGRAM;  Surgeon: Waynetta Sandy, MD;  Location: [facility];  Service: Cardiovascular;  Laterality: Left;   ABDOMINAL AORTOGRAM N/A 01/07/2022   Procedure: ABDOMINAL AORTOGRAM;  Surgeon: Waynetta Sandy, MD;  Location: [facility];  Service: Cardiovascular;  Laterality: N/A;   ABDOMINAL AORTOGRAM W/LOWER EXTREMITY N/A 04/09/2021   Procedure: ABDOMINAL AORTOGRAM W/LOWER EXTREMITY;  Surgeon: Waynetta Sandy, MD;  Location: [facility];  Service: Cardiovascular;  Laterality: N/A;   AMPUTATION Left 05/10/2016   Procedure: Left Transmetatarsal Amputation;  Surgeon: [name], MD;  Location: [facility];  Service: Orthopedics;  Laterality: Left;   AMPUTATION Right 05/16/2018   Procedure: PARTIAL RAY AMPUTATION FIFTH TOE RIGHT FOOT;  Surgeon: [name], DPM;  Location: [facility];  Service: Podiatry;  Laterality: Right;   AMPUTATION Right 06/17/2018   Procedure: AMPUTATION 4th RAY Right Foot;  Surgeon: Trula Slade, DPM;  Location: [facility];  Service: Podiatry;  Laterality: Right;   AMPUTATION Right 06/19/2018   Procedure: RIGHT BELOW KNEE AMPUTATION;  Surgeon: [name], MD;  Location: [facility];  Service: Orthopedics;  Laterality: Right;   AMPUTATION Left 10/05/2021   Procedure: LEFT RING FINGER AMPUTATION;  Surgeon: Daryll Brod, MD;  Location: [facility];  Service: Orthopedics;  Laterality: Left;  45 MIN   AMPUTATION Left 11/15/2021   Procedure: LEFT INDEX FINGER AMPUTATION;  Surgeon: Leanora Cover, MD;  Location: [facility];  Service: Orthopedics;  Laterality: Left;  60 MIN   AMPUTATION Right 01/08/2022   Procedure: AMPUTATION ABOVE KNEE;  Surgeon: Waynetta Sandy, MD;  Location: [facility];  Service: Vascular;  Laterality: Right;   AMPUTATION Bilateral 01/12/2022   Procedure: LEFT INDEX FINGER AMPUTATION, RIGHT INDEX, LONG AND SMALL DIGITS AMPUTATION;  Surgeon: Leanora Cover, MD;  Location: [facility];  Service: Orthopedics;  Laterality: Bilateral;    AMPUTATION Right 04/01/2022   Procedure: RIGHT INDEX FINGER AMPUTATION AT METACARPAL PHALANGEAL JOINT, INCISION AND DRAINAGE RIGHT HAND;  Surgeon: Leanora Cover, MD;  Location: [facility];  Service: Orthopedics;  Laterality: Right;  90 MIN   AMPUTATION Right 05/23/2022   Procedure: RIGHT INDEX FINGER RAY AMPUTATION; RIGHT LONG, RING, AND SMALL FINGER AMPUTATIONS;  Surgeon: Leanora Cover, MD;  Location: [facility];  Service: Orthopedics;  Laterality: Right;  90 MIN   AMPUTATION Left 08/07/2022   Procedure: LEFT ABOVE KNEE AMPUTATION;  Surgeon: [name], MD;  Location: [facility];  Service: Orthopedics;  Laterality: Left;   AORTIC ARCH ANGIOGRAPHY N/A 01/07/2022   Procedure: AORTIC ARCH ANGIOGRAPHY;  Surgeon: Waynetta Sandy, MD;  Location: [facility];  Service: Cardiovascular;  Laterality: N/A;   APPLICATION OF WOUND VAC Right 06/19/2018   Procedure: APPLICATION OF WOUND VAC;  Surgeon: [name], MD;  Location: [facility];  Service: Orthopedics;  Laterality: Right;   APPLICATION OF WOUND VAC Left 08/07/2022   Procedure: APPLICATION OF WOUND VAC;  Surgeon: [name], MD;  Location: [facility];  Service: Orthopedics;  Laterality: Left;   AV FISTULA PLACEMENT Left 11/25/2019   Procedure: LEFT ARM Brachiocephalic  ARTERIOVENOUS (AV) FISTULA CREATION;  Surgeon: Rosetta Posner, MD;  Location: [facility];  Service: Vascular;  Laterality: Left;   [place] Left 07/19/2020   Procedure: LEFT SECOND STAGE [place];  Surgeon: Waynetta Sandy, MD;  Location: [facility];  Service: Vascular;  Laterality: Left;   CIRCUMCISION  09/02/2011   Procedure: CIRCUMCISION ADULT;  Surgeon: Molli Hazard, MD;  Location: [facility];  Service: Urology;  Laterality: N/A;   CORONARY STENT INTERVENTION N/A 07/29/2018   Procedure: CORONARY STENT INTERVENTION;  Surgeon: Leonie Man, MD;  Location: [facility];  Service: Cardiovascular;  Laterality: N/A;    DEBRIDEMENT AND CLOSURE WOUND Left 11/15/2021   Procedure: LEFT HAND WOUND CLOSURE;  Surgeon: Leanora Cover, MD;  Location: [facility];  Service: Orthopedics;  Laterality: Left;   I & D EXTREMITY  09/02/2011   Procedure: IRRIGATION AND DEBRIDEMENT EXTREMITY;  Surgeon: Theodoro Kos, DO;  Location: [facility];  Service: Plastics;  Laterality: N/A;   INCISION AND DRAINAGE OF WOUND  08/12/2011   Procedure: IRRIGATION AND DEBRIDEMENT WOUND;  Surgeon: Zenovia Jarred, MD;  Location: [facility];  Service: General;;  perineum   INSERTION OF DIALYSIS CATHETER N/A 11/25/2019   Procedure: INSERTION OF Righ Internal Jugular DIALYSIS CATHETER;  Surgeon: Rosetta Posner, MD;  Location: [facility];  Service: Vascular;  Laterality: N/A;   [place]  08/12/2011   Procedure: IRRIGATION AND DEBRIDEMENT ABSCESS;  Surgeon: Molli Hazard, MD;  Location: [facility];  Service: Urology;  Laterality: N/A;  irrigation and debridment perineum     IRRIGATION AND DEBRIDEMENT ABSCESS  08/14/2011   Procedure: MINOR INCISION AND DRAINAGE OF ABSCESS;  Surgeon: Molli Hazard, MD;  Location: [facility];  Service: Urology;  Laterality: N/A;  Perineal Wound Debridement;Placement Bilateral Testicular Thigh Pouches   LEFT HEART CATH AND CORONARY ANGIOGRAPHY N/A 07/29/2018   Procedure: LEFT HEART CATH AND CORONARY ANGIOGRAPHY;  Surgeon: Leonie Man, MD;  Location: [facility];  Service: Cardiovascular;  Laterality: N/A;   LOWER EXTREMITY ANGIOGRAPHY Bilateral 01/07/2022   Procedure: Lower Extremity Angiography;  Surgeon: Waynetta Sandy, MD;  Location: [facility];  Service: Cardiovascular;  Laterality: Bilateral;   PERIPHERAL VASCULAR ATHERECTOMY Left 04/09/2021   Procedure: PERIPHERAL VASCULAR ATHERECTOMY;  Surgeon: Waynetta Sandy, MD;  Location: [facility];  Service: Cardiovascular;  Laterality: Left;  TP trunk and peroneal arteries   PERIPHERAL VASCULAR  BALLOON ANGIOPLASTY Left 04/09/2021   Procedure: PERIPHERAL VASCULAR BALLOON ANGIOPLASTY;  Surgeon: Waynetta Sandy, MD;  Location: [facility];  Service: Cardiovascular;  Laterality: Left;  Popliteal   REVISON OF ARTERIOVENOUS FISTULA Left 05/24/2020   Procedure: LEFT ARM ARTERIOVENOUS FISTULA  CONVERSION TO BASILIC VEIN FISTULA;  Surgeon: Waynetta Sandy, MD;  Location: [facility];  Service: Vascular;  Laterality: Left;   STUMP REVISION Left 09/20/2022   Procedure: LEFT ABOVE THE KNEE AMPUTATION REVISION;  Surgeon: [name], MD;  Location: [facility];  Service: Orthopedics;  Laterality: Left;   TOTAL HIP ARTHROPLASTY Left 10/11/2016   Procedure: LEFT TOTAL HIP ARTHROPLASTY ANTERIOR APPROACH;  Surgeon: Mcarthur Rossetti, MD;  Location: [facility];  Service: Orthopedics;  Laterality: Left;   UPPER EXTREMITY ANGIOGRAPHY Bilateral 01/07/2022   Procedure: Upper Extremity Angiography;  Surgeon: Waynetta Sandy, MD;  Location: [facility];  Service: Cardiovascular;  Laterality: Bilateral;   Social History   Occupational History   Occupation: Disabled  Tobacco Use   Smoking status: Former    Packs/day: 0.25    Years: 24.00    Total pack years: 6.00    Types: Cigarettes    Quit date: 11/19/2019    Years since quitting: 2.9   Smokeless tobacco: Never  Vaping Use   Vaping Use: Never used  Substance and Sexual Activity   Alcohol use: Not Currently    Alcohol/week: 0.0 standard drinks of alcohol    Comment: quit 2018   Drug use: No   Sexual activity: Not Currently    Birth control/protection: Condom

## 2022-10-17 DIAGNOSIS — R52 Pain, unspecified: Secondary | ICD-10-CM | POA: Diagnosis not present

## 2022-10-17 DIAGNOSIS — N2581 Secondary hyperparathyroidism of renal origin: Secondary | ICD-10-CM | POA: Diagnosis not present

## 2022-10-17 DIAGNOSIS — L299 Pruritus, unspecified: Secondary | ICD-10-CM | POA: Diagnosis not present

## 2022-10-17 DIAGNOSIS — Z992 Dependence on renal dialysis: Secondary | ICD-10-CM | POA: Diagnosis not present

## 2022-10-17 DIAGNOSIS — R197 Diarrhea, unspecified: Secondary | ICD-10-CM | POA: Diagnosis not present

## 2022-10-17 DIAGNOSIS — E1129 Type 2 diabetes mellitus with other diabetic kidney complication: Secondary | ICD-10-CM | POA: Diagnosis not present

## 2022-10-17 DIAGNOSIS — N186 End stage renal disease: Secondary | ICD-10-CM | POA: Diagnosis not present

## 2022-10-17 DIAGNOSIS — D689 Coagulation defect, unspecified: Secondary | ICD-10-CM | POA: Diagnosis not present

## 2022-10-17 DIAGNOSIS — T8249XD Other complication of vascular dialysis catheter, subsequent encounter: Secondary | ICD-10-CM | POA: Diagnosis not present

## 2022-10-17 DIAGNOSIS — D631 Anemia in chronic kidney disease: Secondary | ICD-10-CM | POA: Diagnosis not present

## 2022-10-19 DIAGNOSIS — L299 Pruritus, unspecified: Secondary | ICD-10-CM | POA: Diagnosis not present

## 2022-10-19 DIAGNOSIS — D689 Coagulation defect, unspecified: Secondary | ICD-10-CM | POA: Diagnosis not present

## 2022-10-19 DIAGNOSIS — R52 Pain, unspecified: Secondary | ICD-10-CM | POA: Diagnosis not present

## 2022-10-19 DIAGNOSIS — Z992 Dependence on renal dialysis: Secondary | ICD-10-CM | POA: Diagnosis not present

## 2022-10-19 DIAGNOSIS — N186 End stage renal disease: Secondary | ICD-10-CM | POA: Diagnosis not present

## 2022-10-19 DIAGNOSIS — D631 Anemia in chronic kidney disease: Secondary | ICD-10-CM | POA: Diagnosis not present

## 2022-10-19 DIAGNOSIS — R197 Diarrhea, unspecified: Secondary | ICD-10-CM | POA: Diagnosis not present

## 2022-10-19 DIAGNOSIS — N2581 Secondary hyperparathyroidism of renal origin: Secondary | ICD-10-CM | POA: Diagnosis not present

## 2022-10-19 DIAGNOSIS — T8249XD Other complication of vascular dialysis catheter, subsequent encounter: Secondary | ICD-10-CM | POA: Diagnosis not present

## 2022-10-22 DIAGNOSIS — D631 Anemia in chronic kidney disease: Secondary | ICD-10-CM | POA: Diagnosis not present

## 2022-10-22 DIAGNOSIS — D689 Coagulation defect, unspecified: Secondary | ICD-10-CM | POA: Diagnosis not present

## 2022-10-22 DIAGNOSIS — R52 Pain, unspecified: Secondary | ICD-10-CM | POA: Diagnosis not present

## 2022-10-22 DIAGNOSIS — E1152 Type 2 diabetes mellitus with diabetic peripheral angiopathy with gangrene: Secondary | ICD-10-CM | POA: Diagnosis not present

## 2022-10-22 DIAGNOSIS — I132 Hypertensive heart and chronic kidney disease with heart failure and with stage 5 chronic kidney disease, or end stage renal disease: Secondary | ICD-10-CM | POA: Diagnosis not present

## 2022-10-22 DIAGNOSIS — Z89612 Acquired absence of left leg above knee: Secondary | ICD-10-CM | POA: Diagnosis not present

## 2022-10-22 DIAGNOSIS — N2581 Secondary hyperparathyroidism of renal origin: Secondary | ICD-10-CM | POA: Diagnosis not present

## 2022-10-22 DIAGNOSIS — Z992 Dependence on renal dialysis: Secondary | ICD-10-CM | POA: Diagnosis not present

## 2022-10-22 DIAGNOSIS — T8249XD Other complication of vascular dialysis catheter, subsequent encounter: Secondary | ICD-10-CM | POA: Diagnosis not present

## 2022-10-22 DIAGNOSIS — I70262 Atherosclerosis of native arteries of extremities with gangrene, left leg: Secondary | ICD-10-CM | POA: Diagnosis not present

## 2022-10-22 DIAGNOSIS — Z4781 Encounter for orthopedic aftercare following surgical amputation: Secondary | ICD-10-CM | POA: Diagnosis not present

## 2022-10-22 DIAGNOSIS — N186 End stage renal disease: Secondary | ICD-10-CM | POA: Diagnosis not present

## 2022-10-22 DIAGNOSIS — R197 Diarrhea, unspecified: Secondary | ICD-10-CM | POA: Diagnosis not present

## 2022-10-22 DIAGNOSIS — L299 Pruritus, unspecified: Secondary | ICD-10-CM | POA: Diagnosis not present

## 2022-10-22 DIAGNOSIS — I5042 Chronic combined systolic (congestive) and diastolic (congestive) heart failure: Secondary | ICD-10-CM | POA: Diagnosis not present

## 2022-10-23 ENCOUNTER — Ambulatory Visit (INDEPENDENT_AMBULATORY_CARE_PROVIDER_SITE_OTHER): Payer: 59 | Admitting: Family

## 2022-10-23 ENCOUNTER — Other Ambulatory Visit: Payer: Self-pay

## 2022-10-23 ENCOUNTER — Encounter: Payer: Self-pay | Admitting: Family

## 2022-10-23 DIAGNOSIS — T8781 Dehiscence of amputation stump: Secondary | ICD-10-CM

## 2022-10-23 DIAGNOSIS — N186 End stage renal disease: Secondary | ICD-10-CM

## 2022-10-23 DIAGNOSIS — Z89612 Acquired absence of left leg above knee: Secondary | ICD-10-CM

## 2022-10-23 MED ORDER — OXYCODONE HCL 5 MG PO TABS: 5.0000 mg | ORAL_TABLET | Freq: Four times a day (QID) | ORAL | 0 refills | Status: DC | PRN

## 2022-10-23 MED ORDER — DOXYCYCLINE HYCLATE 100 MG PO TABS: 100.0000 mg | ORAL_TABLET | Freq: Two times a day (BID) | ORAL | 0 refills | Status: DC

## 2022-10-23 NOTE — Progress Notes (Signed)
Post-Op Visit Note   Patient: Edward [name]           Date of Birth: [birth year]           MRN: [id number] Visit Date: 10/23/2022 PCP: [name], MD  Chief Complaint:  Chief Complaint  Patient presents with   Left Leg - Routine Post Op    09/20/2022 left AKA revision with kerecis graft     HPI:  HPI The patient is a 52 year old gentleman seen status post left above-knee amputation 4 weeks ago.  Presents today concern for some blackened discoloration to the entire incision.  Staples are in place.  She is having some drainage as well Ortho Exam On examination of the left above-knee amputation and staples in place there is ischemic changes to the anterior portion of his incision unfortunately this has dehisced there is purulent present.  The medial two thirds of the incision is open with exposed bone No ascending cellulitis Visit Diagnoses: No diagnosis found.  Plan: Discussed with patient.  Will place him on the nodules set him up for revision above-knee amputation on the left.  Discussed possibility of hip disarticulation.  Patient states he understands.  Follow-Up Instructions: No follow-ups on file.   Imaging: No results found.  Orders:  No orders of the defined types were placed in this encounter.  No orders of the defined types were placed in this encounter.    PMFS History: Patient Active Problem List   Diagnosis Date Noted   Chronic hypotension 10/05/2022   Elevated troponin 10/05/2022   S/P AKA (above knee amputation), left ([place]) 09/20/2022   Dehiscence of amputation stump ([place]) 09/20/2022   S/P bilateral above knee amputation ([place]) 08/13/2022   Atherosclerosis of native arteries of extremities with gangrene, left leg ([place]) 08/06/2022   Diabetic foot ulcer ([place]) 08/05/2022   Pectoralis muscle strain 05/10/2022   Cervical lesion 05/10/2022   Hand abscess 0000000   Acute metabolic encephalopathy    PAD (peripheral artery disease) ([place]) 01/07/2022    Osteomyelitis of finger of left hand ([place]) 10/04/2021   Junctional rhythm 10/04/2021   Acute cholecystitis 09/27/2020   Elevated LFTs 09/27/2020   TIA (transient ischemic attack) 09/27/2020   Nausea and vomiting    ESRD on hemodialysis ([place]) 06/22/2020   S/P coronary artery stent placement 06/22/2020   Encounter for removal of sutures 03/07/2020   Fluid overload, unspecified 12/29/2019   Hypertensive chronic kidney disease with stage 1 through stage 4 chronic kidney disease, or unspecified chronic kidney disease 11/30/2019   Pruritus, unspecified 11/30/2019   Secondary hyperparathyroidism of renal origin ([place]) 11/30/2019   Pain, unspecified 11/30/2019   Other specified arthritis, unspecified site 11/30/2019   Iron deficiency anemia, unspecified 11/30/2019   End stage renal disease ([place]) 11/30/2019   Encounter for screening for respiratory tuberculosis 11/30/2019   Diarrhea, unspecified 11/30/2019   Coagulation defect, unspecified ([place]) 11/30/2019   Atherosclerotic heart disease of native coronary artery without angina pectoris 11/30/2019   Transaminitis 12/22/2018   Acute exacerbation of CHF (congestive heart failure) ([place]) 12/21/2018   Gait abnormality 09/11/2018   CAD S/P percutaneous coronary angioplasty 08/06/2018   Cardiomyopathy ([place]) 08/06/2018   PVD (peripheral vascular disease) ([place]) 08/06/2018   Shortness of breath    Chronic combined systolic and diastolic CHF (congestive heart failure) ([place])    History of NSTEMI 07/28/2018   Respiratory failure with hypoxia ([place]) 07/28/2018   Acute heart failure ([place]) 07/28/2018   Hypertensive emergency 07/28/2018   S/P BKA (  below knee amputation) unilateral, right ([place]) 07/15/2018   Hypoglycemia    Anxiety about health    Labile blood glucose    Essential hypertension    History of transmetatarsal amputation of left foot (HCC)    Hypoalbuminemia due to protein-calorie malnutrition ([place])    Type 2 diabetes mellitus with chronic  kidney disease, without long-term current use of insulin (HCC)    Acute blood loss anemia    Anemia of chronic disease    Leukocytosis    Tobacco abuse    Hyperkalemia 06/20/2018   Diabetic polyneuropathy associated with type 2 diabetes mellitus ([place])    Severe protein-calorie malnutrition ([place])    AKI (acute kidney injury) ([place]) 06/17/2018   Normocytic anemia 06/17/2018   Viral pharyngitis 01/14/2017   Cigarette smoker 01/14/2017   Avascular necrosis of hip, left ([place]) 10/11/2016   Status post left hip replacement 10/11/2016   S/P transmetatarsal amputation of foot, left ([place]) 07/25/2016   Balance problem 07/25/2016   Fournier's gangrene 08/16/2011   Acute respiratory failure ([place]) 08/16/2011   Septic shock(785.52) 08/16/2011   Diabetes mellitus with complication, with long-term current use of insulin ([place]) 08/16/2011   Past Medical History:  Diagnosis Date   Allergy    Anemia    getting Venofer with dialysis   Anxiety    self reported, sometimes-situational anxiety   Arthritis    bilateral hands/LEFT hip   CHF (congestive heart failure) (HCC)    Chronic kidney disease    t th s dialysis- [place] ---ESRD   Coronary artery disease    Depression    self reported, sometimes-situational depression   Diabetes mellitus    type II- on meds   Dyspnea    sometimes- fluid   GERD (gastroesophageal reflux disease)    on meds   History of blood transfusion    Hypertension    no longer on medication since starting on Hemodialysis   Myocardial infarction ([place]) 2019   Nausea and vomiting    Peripheral vascular disease ([place])    Sleep apnea    does not use CPAP    Family History  Problem Relation Age of Onset   Diabetes Other    Pancreatic cancer Mother 72   Colon cancer Neg Hx    Colon polyps Neg Hx    Esophageal cancer Neg Hx    Rectal cancer Neg Hx    Stomach cancer Neg Hx     Past Surgical History:  Procedure Laterality Date   A/V FISTULAGRAM Left 03/06/2020    Procedure: A/V FISTULAGRAM;  Surgeon: Waynetta Sandy, MD;  Location: [facility];  Service: Cardiovascular;  Laterality: Left;   ABDOMINAL AORTOGRAM N/A 01/07/2022   Procedure: ABDOMINAL AORTOGRAM;  Surgeon: Waynetta Sandy, MD;  Location: [facility];  Service: Cardiovascular;  Laterality: N/A;   ABDOMINAL AORTOGRAM W/LOWER EXTREMITY N/A 04/09/2021   Procedure: ABDOMINAL AORTOGRAM W/LOWER EXTREMITY;  Surgeon: Waynetta Sandy, MD;  Location: [facility];  Service: Cardiovascular;  Laterality: N/A;   AMPUTATION Left 05/10/2016   Procedure: Left Transmetatarsal Amputation;  Surgeon: [name], MD;  Location: [facility];  Service: Orthopedics;  Laterality: Left;   AMPUTATION Right 05/16/2018   Procedure: PARTIAL RAY AMPUTATION FIFTH TOE RIGHT FOOT;  Surgeon: [name], DPM;  Location: [facility];  Service: Podiatry;  Laterality: Right;   AMPUTATION Right 06/17/2018   Procedure: AMPUTATION 4th RAY Right Foot;  Surgeon: Trula Slade, DPM;  Location: [facility];  Service: Podiatry;  Laterality: Right;   AMPUTATION Right 06/19/2018   Procedure: RIGHT BELOW KNEE AMPUTATION;  Surgeon: [name], MD;  Location: [facility];  Service: Orthopedics;  Laterality: Right;   AMPUTATION Left 10/05/2021   Procedure: LEFT RING FINGER AMPUTATION;  Surgeon: Daryll Brod, MD;  Location: [facility];  Service: Orthopedics;  Laterality: Left;  45 MIN   AMPUTATION Left 11/15/2021   Procedure: LEFT INDEX FINGER AMPUTATION;  Surgeon: Leanora Cover, MD;  Location: [facility];  Service: Orthopedics;  Laterality: Left;  60 MIN   AMPUTATION Right 01/08/2022   Procedure: AMPUTATION ABOVE KNEE;  Surgeon: Waynetta Sandy, MD;  Location: [facility];  Service: Vascular;  Laterality: Right;   AMPUTATION Bilateral 01/12/2022   Procedure: LEFT INDEX FINGER AMPUTATION, RIGHT INDEX, LONG AND SMALL DIGITS AMPUTATION;  Surgeon: Leanora Cover, MD;  Location: [facility];  Service: Orthopedics;   Laterality: Bilateral;   AMPUTATION Right 04/01/2022   Procedure: RIGHT INDEX FINGER AMPUTATION AT METACARPAL PHALANGEAL JOINT, INCISION AND DRAINAGE RIGHT HAND;  Surgeon: Leanora Cover, MD;  Location: [facility];  Service: Orthopedics;  Laterality: Right;  90 MIN   AMPUTATION Right 05/23/2022   Procedure: RIGHT INDEX FINGER RAY AMPUTATION; RIGHT LONG, RING, AND SMALL FINGER AMPUTATIONS;  Surgeon: Leanora Cover, MD;  Location: [facility];  Service: Orthopedics;  Laterality: Right;  90 MIN   AMPUTATION Left 08/07/2022   Procedure: LEFT ABOVE KNEE AMPUTATION;  Surgeon: [name], MD;  Location: [facility];  Service: Orthopedics;  Laterality: Left;   AORTIC ARCH ANGIOGRAPHY N/A 01/07/2022   Procedure: AORTIC ARCH ANGIOGRAPHY;  Surgeon: Waynetta Sandy, MD;  Location: [facility];  Service: Cardiovascular;  Laterality: N/A;   APPLICATION OF WOUND VAC Right 06/19/2018   Procedure: APPLICATION OF WOUND VAC;  Surgeon: [name], MD;  Location: [facility];  Service: Orthopedics;  Laterality: Right;   APPLICATION OF WOUND VAC Left 08/07/2022   Procedure: APPLICATION OF WOUND VAC;  Surgeon: [name], MD;  Location: [facility];  Service: Orthopedics;  Laterality: Left;   AV FISTULA PLACEMENT Left 11/25/2019   Procedure: LEFT ARM Brachiocephalic  ARTERIOVENOUS (AV) FISTULA CREATION;  Surgeon: Rosetta Posner, MD;  Location: [facility];  Service: Vascular;  Laterality: Left;   [place] Left 07/19/2020   Procedure: LEFT SECOND STAGE [place];  Surgeon: Waynetta Sandy, MD;  Location: [facility];  Service: Vascular;  Laterality: Left;   CIRCUMCISION  09/02/2011   Procedure: CIRCUMCISION ADULT;  Surgeon: Molli Hazard, MD;  Location: [facility];  Service: Urology;  Laterality: N/A;   CORONARY STENT INTERVENTION N/A 07/29/2018   Procedure: CORONARY STENT INTERVENTION;  Surgeon: Leonie Man, MD;  Location: [facility];  Service:  Cardiovascular;  Laterality: N/A;   DEBRIDEMENT AND CLOSURE WOUND Left 11/15/2021   Procedure: LEFT HAND WOUND CLOSURE;  Surgeon: Leanora Cover, MD;  Location: [facility];  Service: Orthopedics;  Laterality: Left;   I & D EXTREMITY  09/02/2011   Procedure: IRRIGATION AND DEBRIDEMENT EXTREMITY;  Surgeon: Theodoro Kos, DO;  Location: [facility];  Service: Plastics;  Laterality: N/A;   INCISION AND DRAINAGE OF WOUND  08/12/2011   Procedure: IRRIGATION AND DEBRIDEMENT WOUND;  Surgeon: Zenovia Jarred, MD;  Location: [facility];  Service: General;;  perineum   INSERTION OF DIALYSIS CATHETER N/A 11/25/2019   Procedure: INSERTION OF Righ Internal Jugular DIALYSIS CATHETER;  Surgeon: Rosetta Posner, MD;  Location: [facility];  Service: Vascular;  Laterality: N/A;   IRRIGATION AND DEBRIDEMENT ABSCESS  08/12/2011   Procedure: IRRIGATION AND DEBRIDEMENT ABSCESS;  Surgeon: Molli Hazard, MD;  Location: [facility];  Service: Urology;  Laterality: N/A;  irrigation and debridment perineum     IRRIGATION AND DEBRIDEMENT ABSCESS  08/14/2011   Procedure: MINOR INCISION AND DRAINAGE OF ABSCESS;  Surgeon: Molli Hazard, MD;  Location: [facility];  Service: Urology;  Laterality: N/A;  Perineal Wound Debridement;Placement Bilateral Testicular Thigh Pouches   LEFT HEART CATH AND CORONARY ANGIOGRAPHY N/A 07/29/2018   Procedure: LEFT HEART CATH AND CORONARY ANGIOGRAPHY;  Surgeon: Leonie Man, MD;  Location: [facility];  Service: Cardiovascular;  Laterality: N/A;   LOWER EXTREMITY ANGIOGRAPHY Bilateral 01/07/2022   Procedure: Lower Extremity Angiography;  Surgeon: Waynetta Sandy, MD;  Location: [facility];  Service: Cardiovascular;  Laterality: Bilateral;   PERIPHERAL VASCULAR ATHERECTOMY Left 04/09/2021   Procedure: PERIPHERAL VASCULAR ATHERECTOMY;  Surgeon: Waynetta Sandy, MD;  Location: [facility];  Service: Cardiovascular;  Laterality: Left;  TP trunk and peroneal  arteries   PERIPHERAL VASCULAR BALLOON ANGIOPLASTY Left 04/09/2021   Procedure: PERIPHERAL VASCULAR BALLOON ANGIOPLASTY;  Surgeon: Waynetta Sandy, MD;  Location: [facility];  Service: Cardiovascular;  Laterality: Left;  Popliteal   REVISON OF ARTERIOVENOUS FISTULA Left 05/24/2020   Procedure: LEFT ARM ARTERIOVENOUS FISTULA  CONVERSION TO BASILIC VEIN FISTULA;  Surgeon: Waynetta Sandy, MD;  Location: [facility];  Service: Vascular;  Laterality: Left;   STUMP REVISION Left 09/20/2022   Procedure: LEFT ABOVE THE KNEE AMPUTATION REVISION;  Surgeon: [name], MD;  Location: [facility];  Service: Orthopedics;  Laterality: Left;   TOTAL HIP ARTHROPLASTY Left 10/11/2016   Procedure: LEFT TOTAL HIP ARTHROPLASTY ANTERIOR APPROACH;  Surgeon: Mcarthur Rossetti, MD;  Location: [facility];  Service: Orthopedics;  Laterality: Left;   UPPER EXTREMITY ANGIOGRAPHY Bilateral 01/07/2022   Procedure: Upper Extremity Angiography;  Surgeon: Waynetta Sandy, MD;  Location: [facility];  Service: Cardiovascular;  Laterality: Bilateral;   Social History   Occupational History   Occupation: Disabled  Tobacco Use   Smoking status: Former    Packs/day: 0.25    Years: 24.00    Total pack years: 6.00    Types: Cigarettes    Quit date: 11/19/2019    Years since quitting: 2.9   Smokeless tobacco: Never  Vaping Use   Vaping Use: Never used  Substance and Sexual Activity   Alcohol use: Not Currently    Alcohol/week: 0.0 standard drinks of alcohol    Comment: quit 2018   Drug use: No   Sexual activity: Not Currently    Birth control/protection: Condom

## 2022-10-24 DIAGNOSIS — T8249XD Other complication of vascular dialysis catheter, subsequent encounter: Secondary | ICD-10-CM | POA: Diagnosis not present

## 2022-10-24 DIAGNOSIS — L299 Pruritus, unspecified: Secondary | ICD-10-CM | POA: Diagnosis not present

## 2022-10-24 DIAGNOSIS — R197 Diarrhea, unspecified: Secondary | ICD-10-CM | POA: Diagnosis not present

## 2022-10-24 DIAGNOSIS — N186 End stage renal disease: Secondary | ICD-10-CM | POA: Diagnosis not present

## 2022-10-24 DIAGNOSIS — D689 Coagulation defect, unspecified: Secondary | ICD-10-CM | POA: Diagnosis not present

## 2022-10-24 DIAGNOSIS — D631 Anemia in chronic kidney disease: Secondary | ICD-10-CM | POA: Diagnosis not present

## 2022-10-24 DIAGNOSIS — N2581 Secondary hyperparathyroidism of renal origin: Secondary | ICD-10-CM | POA: Diagnosis not present

## 2022-10-24 DIAGNOSIS — R52 Pain, unspecified: Secondary | ICD-10-CM | POA: Diagnosis not present

## 2022-10-24 DIAGNOSIS — Z992 Dependence on renal dialysis: Secondary | ICD-10-CM | POA: Diagnosis not present

## 2022-10-25 ENCOUNTER — Ambulatory Visit (HOSPITAL_COMMUNITY)
Admission: RE | Admit: 2022-10-25 | Discharge: 2022-10-25 | Disposition: A | Discharge status: Home / Self Care | Payer: 59 | Source: Ambulatory Visit | Attending: Surgery | Admitting: Surgery

## 2022-10-25 ENCOUNTER — Ambulatory Visit (INDEPENDENT_AMBULATORY_CARE_PROVIDER_SITE_OTHER)
Admission: RE | Admit: 2022-10-25 | Discharge: 2022-10-25 | Disposition: A | Discharge status: Home / Self Care | Payer: 59 | Source: Ambulatory Visit | Attending: Vascular Surgery | Admitting: Vascular Surgery

## 2022-10-25 DIAGNOSIS — Z992 Dependence on renal dialysis: Secondary | ICD-10-CM

## 2022-10-25 DIAGNOSIS — N186 End stage renal disease: Secondary | ICD-10-CM

## 2022-10-26 DIAGNOSIS — R52 Pain, unspecified: Secondary | ICD-10-CM | POA: Diagnosis not present

## 2022-10-26 DIAGNOSIS — T8249XD Other complication of vascular dialysis catheter, subsequent encounter: Secondary | ICD-10-CM | POA: Diagnosis not present

## 2022-10-26 DIAGNOSIS — D689 Coagulation defect, unspecified: Secondary | ICD-10-CM | POA: Diagnosis not present

## 2022-10-26 DIAGNOSIS — N186 End stage renal disease: Secondary | ICD-10-CM | POA: Diagnosis not present

## 2022-10-26 DIAGNOSIS — D631 Anemia in chronic kidney disease: Secondary | ICD-10-CM | POA: Diagnosis not present

## 2022-10-26 DIAGNOSIS — N2581 Secondary hyperparathyroidism of renal origin: Secondary | ICD-10-CM | POA: Diagnosis not present

## 2022-10-26 DIAGNOSIS — Z992 Dependence on renal dialysis: Secondary | ICD-10-CM | POA: Diagnosis not present

## 2022-10-26 DIAGNOSIS — L299 Pruritus, unspecified: Secondary | ICD-10-CM | POA: Diagnosis not present

## 2022-10-26 DIAGNOSIS — R197 Diarrhea, unspecified: Secondary | ICD-10-CM | POA: Diagnosis not present

## 2022-10-28 DIAGNOSIS — D631 Anemia in chronic kidney disease: Secondary | ICD-10-CM | POA: Diagnosis not present

## 2022-10-28 DIAGNOSIS — T8781 Dehiscence of amputation stump: Secondary | ICD-10-CM | POA: Diagnosis not present

## 2022-10-28 DIAGNOSIS — R143 Flatulence: Secondary | ICD-10-CM | POA: Diagnosis not present

## 2022-10-28 DIAGNOSIS — N186 End stage renal disease: Secondary | ICD-10-CM | POA: Diagnosis not present

## 2022-10-28 DIAGNOSIS — E1122 Type 2 diabetes mellitus with diabetic chronic kidney disease: Secondary | ICD-10-CM | POA: Diagnosis not present

## 2022-10-28 DIAGNOSIS — I132 Hypertensive heart and chronic kidney disease with heart failure and with stage 5 chronic kidney disease, or end stage renal disease: Secondary | ICD-10-CM | POA: Diagnosis not present

## 2022-10-28 DIAGNOSIS — I10 Essential (primary) hypertension: Secondary | ICD-10-CM | POA: Diagnosis not present

## 2022-10-28 DIAGNOSIS — I5042 Chronic combined systolic (congestive) and diastolic (congestive) heart failure: Secondary | ICD-10-CM | POA: Diagnosis not present

## 2022-10-28 DIAGNOSIS — L304 Erythema intertrigo: Secondary | ICD-10-CM | POA: Diagnosis not present

## 2022-10-28 DIAGNOSIS — K219 Gastro-esophageal reflux disease without esophagitis: Secondary | ICD-10-CM | POA: Diagnosis not present

## 2022-10-29 DIAGNOSIS — N2581 Secondary hyperparathyroidism of renal origin: Secondary | ICD-10-CM | POA: Diagnosis not present

## 2022-10-29 DIAGNOSIS — R197 Diarrhea, unspecified: Secondary | ICD-10-CM | POA: Diagnosis not present

## 2022-10-29 DIAGNOSIS — D631 Anemia in chronic kidney disease: Secondary | ICD-10-CM | POA: Diagnosis not present

## 2022-10-29 DIAGNOSIS — N186 End stage renal disease: Secondary | ICD-10-CM | POA: Diagnosis not present

## 2022-10-29 DIAGNOSIS — R52 Pain, unspecified: Secondary | ICD-10-CM | POA: Diagnosis not present

## 2022-10-29 DIAGNOSIS — T8249XD Other complication of vascular dialysis catheter, subsequent encounter: Secondary | ICD-10-CM | POA: Diagnosis not present

## 2022-10-29 DIAGNOSIS — D689 Coagulation defect, unspecified: Secondary | ICD-10-CM | POA: Diagnosis not present

## 2022-10-29 DIAGNOSIS — L299 Pruritus, unspecified: Secondary | ICD-10-CM | POA: Diagnosis not present

## 2022-10-29 DIAGNOSIS — Z992 Dependence on renal dialysis: Secondary | ICD-10-CM | POA: Diagnosis not present

## 2022-10-30 ENCOUNTER — Ambulatory Visit (INDEPENDENT_AMBULATORY_CARE_PROVIDER_SITE_OTHER): Payer: 59 | Admitting: Vascular Surgery

## 2022-10-30 ENCOUNTER — Encounter: Payer: Self-pay | Admitting: Vascular Surgery

## 2022-10-30 VITALS — BP 88/60 | Temp 97.9°F | Resp 20

## 2022-10-30 DIAGNOSIS — N186 End stage renal disease: Secondary | ICD-10-CM

## 2022-10-30 NOTE — Progress Notes (Signed)
Patient ID: Edward [name], male   DOB: August 24, 1970, 52 y.o.   MRN: [id number]  Reason for Consult: Follow-up   Referred by Dwana Melena, MD  Subjective:     HPI:  Edward [name] is a 52 y.o. male with history of above-knee amputations now with a large left AKA nonhealing wound scheduled for surgery again this Friday for debridement.  He also has multiple previous finger amputations and multiple failed accesses particularly in the left upper extremity.  Denies any previous femoral access.  He is currently on dialysis via catheter.  He has not had any fevers or chills but does have a large wound of the left amputation site which is changing daily.  Past Medical History:  Diagnosis Date   Allergy    Anemia    getting Venofer with dialysis   Anxiety    self reported, sometimes-situational anxiety   Arthritis    bilateral hands/LEFT hip   CHF (congestive heart failure) (HCC)    Chronic kidney disease    t th s dialysis- [place] ---ESRD   Coronary artery disease    Depression    self reported, sometimes-situational depression   Diabetes mellitus    type II- on meds   Dyspnea    sometimes- fluid   GERD (gastroesophageal reflux disease)    on meds   History of blood transfusion    Hypertension    no longer on medication since starting on Hemodialysis   Myocardial infarction ([place]) 2019   Nausea and vomiting    Peripheral vascular disease ([place])    Sleep apnea    does not use CPAP   Family History  Problem Relation Age of Onset   Diabetes Other    Pancreatic cancer Mother 20   Colon cancer Neg Hx    Colon polyps Neg Hx    Esophageal cancer Neg Hx    Rectal cancer Neg Hx    Stomach cancer Neg Hx    Past Surgical History:  Procedure Laterality Date   A/V FISTULAGRAM Left 03/06/2020   Procedure: A/V FISTULAGRAM;  Surgeon: Waynetta Sandy, MD;  Location: [facility];  Service: Cardiovascular;  Laterality: Left;   ABDOMINAL AORTOGRAM N/A 01/07/2022    Procedure: ABDOMINAL AORTOGRAM;  Surgeon: Waynetta Sandy, MD;  Location: [facility];  Service: Cardiovascular;  Laterality: N/A;   ABDOMINAL AORTOGRAM W/LOWER EXTREMITY N/A 04/09/2021   Procedure: ABDOMINAL AORTOGRAM W/LOWER EXTREMITY;  Surgeon: Waynetta Sandy, MD;  Location: [facility];  Service: Cardiovascular;  Laterality: N/A;   AMPUTATION Left 05/10/2016   Procedure: Left Transmetatarsal Amputation;  Surgeon: [name], MD;  Location: [place];  Service: Orthopedics;  Laterality: Left;   AMPUTATION Right 05/16/2018   Procedure: PARTIAL RAY AMPUTATION FIFTH TOE RIGHT FOOT;  Surgeon: [name], DPM;  Location: [place];  Service: Podiatry;  Laterality: Right;   AMPUTATION Right 06/17/2018   Procedure: AMPUTATION 4th RAY Right Foot;  Surgeon: Trula Slade, DPM;  Location: [place];  Service: Podiatry;  Laterality: Right;   AMPUTATION Right 06/19/2018   Procedure: RIGHT BELOW KNEE AMPUTATION;  Surgeon: [name], MD;  Location: [place];  Service: Orthopedics;  Laterality: Right;   AMPUTATION Left 10/05/2021   Procedure: LEFT RING FINGER AMPUTATION;  Surgeon: Daryll Brod, MD;  Location: [place];  Service: Orthopedics;  Laterality: Left;  45 MIN   AMPUTATION Left 11/15/2021   Procedure: LEFT INDEX FINGER AMPUTATION;  Surgeon: Leanora Cover,  MD;  Location: [place];  Service: Orthopedics;  Laterality: Left;  60 MIN   AMPUTATION Right 01/08/2022   Procedure: AMPUTATION ABOVE KNEE;  Surgeon: Waynetta Sandy, MD;  Location: [place];  Service: Vascular;  Laterality: Right;   AMPUTATION Bilateral 01/12/2022   Procedure: LEFT INDEX FINGER AMPUTATION, RIGHT INDEX, LONG AND SMALL DIGITS AMPUTATION;  Surgeon: Leanora Cover, MD;  Location: [place];  Service: Orthopedics;  Laterality: Bilateral;   AMPUTATION Right 04/01/2022   Procedure: RIGHT INDEX FINGER AMPUTATION AT METACARPAL PHALANGEAL JOINT, INCISION AND DRAINAGE RIGHT HAND;  Surgeon: Leanora Cover, MD;   Location: [place];  Service: Orthopedics;  Laterality: Right;  90 MIN   AMPUTATION Right 05/23/2022   Procedure: RIGHT INDEX FINGER RAY AMPUTATION; RIGHT LONG, RING, AND SMALL FINGER AMPUTATIONS;  Surgeon: Leanora Cover, MD;  Location: [place];  Service: Orthopedics;  Laterality: Right;  90 MIN   AMPUTATION Left 08/07/2022   Procedure: LEFT ABOVE KNEE AMPUTATION;  Surgeon: [name], MD;  Location: [place];  Service: Orthopedics;  Laterality: Left;   AORTIC ARCH ANGIOGRAPHY N/A 01/07/2022   Procedure: AORTIC ARCH ANGIOGRAPHY;  Surgeon: Waynetta Sandy, MD;  Location: [facility];  Service: Cardiovascular;  Laterality: N/A;   APPLICATION OF WOUND VAC Right 06/19/2018   Procedure: APPLICATION OF WOUND VAC;  Surgeon: [name], MD;  Location: [place];  Service: Orthopedics;  Laterality: Right;   APPLICATION OF WOUND VAC Left 08/07/2022   Procedure: APPLICATION OF WOUND VAC;  Surgeon: [name], MD;  Location: [place];  Service: Orthopedics;  Laterality: Left;   AV FISTULA PLACEMENT Left 11/25/2019   Procedure: LEFT ARM Brachiocephalic  ARTERIOVENOUS (AV) FISTULA CREATION;  Surgeon: Rosetta Posner, MD;  Location: [place];  Service: Vascular;  Laterality: Left;   [place] Left 07/19/2020   Procedure: LEFT SECOND STAGE [place];  Surgeon: Waynetta Sandy, MD;  Location: [place];  Service: Vascular;  Laterality: Left;   CIRCUMCISION  09/02/2011   Procedure: CIRCUMCISION ADULT;  Surgeon: Molli Hazard, MD;  Location: [place];  Service: Urology;  Laterality: N/A;   CORONARY STENT INTERVENTION N/A 07/29/2018   Procedure: CORONARY STENT INTERVENTION;  Surgeon: Leonie Man, MD;  Location: [facility];  Service: Cardiovascular;  Laterality: N/A;   DEBRIDEMENT AND CLOSURE WOUND Left 11/15/2021   Procedure: LEFT HAND WOUND CLOSURE;  Surgeon: Leanora Cover, MD;  Location: [place];  Service: Orthopedics;  Laterality:  Left;   I & D EXTREMITY  09/02/2011   Procedure: IRRIGATION AND DEBRIDEMENT EXTREMITY;  Surgeon: Theodoro Kos, DO;  Location: [place];  Service: Plastics;  Laterality: N/A;   INCISION AND DRAINAGE OF WOUND  08/12/2011   Procedure: IRRIGATION AND DEBRIDEMENT WOUND;  Surgeon: Zenovia Jarred, MD;  Location: [place];  Service: General;;  perineum   INSERTION OF DIALYSIS CATHETER N/A 11/25/2019   Procedure: INSERTION OF Righ Internal Jugular DIALYSIS CATHETER;  Surgeon: Rosetta Posner, MD;  Location: [place];  Service: Vascular;  Laterality: N/A;   [place]  08/12/2011   Procedure: IRRIGATION AND DEBRIDEMENT ABSCESS;  Surgeon: Molli Hazard, MD;  Location: [place];  Service: Urology;  Laterality: N/A;  irrigation and debridment perineum     IRRIGATION AND DEBRIDEMENT ABSCESS  08/14/2011   Procedure: MINOR INCISION AND DRAINAGE OF ABSCESS;  Surgeon: Molli Hazard, MD;  Location: [place];  Service: Urology;  Laterality: N/A;  Perineal  Wound Debridement;Placement Bilateral Testicular Thigh Pouches   LEFT HEART CATH AND CORONARY ANGIOGRAPHY N/A 07/29/2018   Procedure: LEFT HEART CATH AND CORONARY ANGIOGRAPHY;  Surgeon: Leonie Man, MD;  Location: [facility];  Service: Cardiovascular;  Laterality: N/A;   LOWER EXTREMITY ANGIOGRAPHY Bilateral 01/07/2022   Procedure: Lower Extremity Angiography;  Surgeon: Waynetta Sandy, MD;  Location: [facility];  Service: Cardiovascular;  Laterality: Bilateral;   PERIPHERAL VASCULAR ATHERECTOMY Left 04/09/2021   Procedure: PERIPHERAL VASCULAR ATHERECTOMY;  Surgeon: Waynetta Sandy, MD;  Location: [facility];  Service: Cardiovascular;  Laterality: Left;  TP trunk and peroneal arteries   PERIPHERAL VASCULAR BALLOON ANGIOPLASTY Left 04/09/2021   Procedure: PERIPHERAL VASCULAR BALLOON ANGIOPLASTY;  Surgeon: Waynetta Sandy, MD;  Location: [facility];   Service: Cardiovascular;  Laterality: Left;  Popliteal   REVISON OF ARTERIOVENOUS FISTULA Left 05/24/2020   Procedure: LEFT ARM ARTERIOVENOUS FISTULA  CONVERSION TO BASILIC VEIN FISTULA;  Surgeon: Waynetta Sandy, MD;  Location: [place];  Service: Vascular;  Laterality: Left;   STUMP REVISION Left 09/20/2022   Procedure: LEFT ABOVE THE KNEE AMPUTATION REVISION;  Surgeon: [name], MD;  Location: [place];  Service: Orthopedics;  Laterality: Left;   TOTAL HIP ARTHROPLASTY Left 10/11/2016   Procedure: LEFT TOTAL HIP ARTHROPLASTY ANTERIOR APPROACH;  Surgeon: Mcarthur Rossetti, MD;  Location: [facility];  Service: Orthopedics;  Laterality: Left;   UPPER EXTREMITY ANGIOGRAPHY Bilateral 01/07/2022   Procedure: Upper Extremity Angiography;  Surgeon: Waynetta Sandy, MD;  Location: [facility];  Service: Cardiovascular;  Laterality: Bilateral;    Short Social History:  Social History   Tobacco Use   Smoking status: Former    Packs/day: 0.25    Years: 24.00    Total pack years: 6.00    Types: Cigarettes    Quit date: 11/19/2019    Years since quitting: 2.9   Smokeless tobacco: Never  Substance Use Topics   Alcohol use: Not Currently    Alcohol/week: 0.0 standard drinks of alcohol    Comment: quit 2018    Allergies  Allergen Reactions   Crestor [Rosuvastatin] Rash    Eye redness   Dilaudid [Hydromorphone] Itching   Robaxin [Methocarbamol] Other (See Comments)    Pt states medication made his arm pain worse.   Benadryl [Diphenhydramine] Rash    Current Outpatient Medications  Medication Sig Dispense Refill   acetaminophen (TYLENOL) 325 MG tablet Take 1-2 tablets (325-650 mg total) by mouth every 6 (six) hours as needed for mild pain (pain score 1-3 or temp > 100.5).     ALPRAZolam (XANAX) 0.25 MG tablet Take 1 tablet (0.25 mg total) by mouth daily. 10 tablet 0   B Complex-C (B-COMPLEX WITH VITAMIN C) tablet Take by mouth.     Doxercalciferol (HECTOROL IV)  Doxercalciferol (Hectorol)     doxycycline (VIBRA-TABS) 100 MG tablet Take 1 tablet (100 mg total) by mouth 2 (two) times daily. 28 tablet 0   ezetimibe (ZETIA) 10 MG tablet Take 1 tablet (10 mg total) by mouth daily. 30 tablet 0   gabapentin (NEURONTIN) 100 MG capsule Take 1 capsule (100 mg total) by mouth 3 (three) times daily. (Patient taking differently: Take 100 mg by mouth 2 (two) times daily.) 90 capsule 0   Insulin Lispro Prot & Lispro (HUMALOG 75/25 MIX) (75-25) 100 UNIT/ML Kwikpen Inject 25-30 Units into the skin daily.     linaclotide (LINZESS) 72 MCG capsule Take 1  capsule (72 mcg total) by mouth in the morning. 30 capsule 0   linagliptin (TRADJENTA) 5 MG TABS tablet Take by mouth.     Methoxy PEG-Epoetin Beta (MIRCERA IJ) Mircera     midodrine (PROAMATINE) 10 MG tablet Take by mouth.     oxyCODONE (OXY IR/ROXICODONE) 5 MG immediate release tablet Take 1 tablet (5 mg total) by mouth every 6 (six) hours as needed for severe pain. 30 tablet 0   pantoprazole (PROTONIX) 40 MG tablet Take 1 tablet (40 mg total) by mouth daily. 30 tablet 0   sucroferric oxyhydroxide (VELPHORO) 500 MG chewable tablet Chew 2 tablets (1,000 mg total) by mouth 3 (three) times daily with meals. 90 tablet 0   atorvastatin (LIPITOR) 80 MG tablet Take 1 tablet (80 mg total) by mouth daily. (Patient not taking: Reported on 10/30/2022) 30 tablet 0   No current facility-administered medications for this visit.    Review of Systems  Constitutional:  Constitutional negative. HENT: HENT negative.  Eyes: Eyes negative.  Respiratory: Respiratory negative.  Cardiovascular: Cardiovascular negative.  GI: Gastrointestinal negative.  Skin: Positive for wound.  Hematologic: Hematologic/lymphatic negative.  Psychiatric: Psychiatric negative.        Objective:  Objective   Vitals:   10/30/22 0848  BP: (!) 88/60  Resp: 20  Temp: 97.9 F (36.6 C)   There is no height or weight on file to calculate BMI.  Physical  Exam HENT:     Head: Normocephalic.  Eyes:     Pupils: Pupils are equal, round, and reactive to light.  Cardiovascular:     Pulses:          Radial pulses are 0 on the right side and 0 on the left side.       Femoral pulses are 0 on the right side and 0 on the left side. Pulmonary:     Effort: Pulmonary effort is normal.  Abdominal:     General: Abdomen is flat.  Musculoskeletal:        General: Swelling present.     Comments: Healed right aka, large left aka wound Missing multiple finger  Neurological:     General: No focal deficit present.     Mental Status: He is alert.     Data: Right Cephalic   Diameter (cm)Depth (cm)       Findings         +-----------------+-------------+----------+----------------------+  Shoulder            0.39                                       +-----------------+-------------+----------+----------------------+  Prox upper arm       0.29                                       +-----------------+-------------+----------+----------------------+  Mid upper arm        0.28                     branching         +-----------------+-------------+----------+----------------------+  Dist upper arm       0.25                                       +-----------------+-------------+----------+----------------------+  Antecubital fossa    0.23               branching and thrombus  +-----------------+-------------+----------+----------------------+  Prox forearm                                   thrombus         +-----------------+-------------+----------+----------------------+  Mid forearm                                    thrombus         +-----------------+-------------+----------+----------------------+   +-----------------+-------------+----------+--------+  Right Basilic    Diameter (cm)Depth (cm)Findings  +-----------------+-------------+----------+--------+  Prox upper arm       0.61                          +-----------------+-------------+----------+--------+  Mid upper arm        0.25                         +-----------------+-------------+----------+--------+  Dist upper arm       0.44               thrombus  +-----------------+-------------+----------+--------+  Antecubital fossa                       thrombus  +-----------------+-------------+----------+--------+   Summary: Right: Thrombus observed in the cephalic and basilic veins.   Right Pre-Dialysis Findings:  +----------------+----------+-----------------+--------+-------------------  ----+  Location       PSV (cm/s)Intralum. Diam.  WaveformComments                                            (cm)                                               +----------------+----------+-----------------+--------+-------------------  ----+  Brachial       31        0.49             biphasichigh bifurcation          Antecub. fossa                                     circumferential                                                              calcification             +----------------+----------+-----------------+--------+-------------------  ----+  Radial Art at   24        0.21             biphasiccircumferential           Wrist  calcification             +----------------+----------+-----------------+--------+-------------------  ----+  Ulnar Art at    29        0.26             biphasiccircumferential           Wrist                                              calcification             +----------------+----------+-----------------+--------+-------------------  ----+      Assessment/Plan:    52 year old male with history of end-stage renal disease currently on dialysis via catheter.  He does not appear to be a good candidate for graft given his current infection and I do not think upper extremity fistula would  be a good idea either given his multiple necrotic fingers and previous amputations.  As such she will continue with catheter and hopefully he can heal his groin wound we could consider evaluating for femoral graft in the future though I do not think he will ever really be a candidate for more surgery and he is not interested in any further surgery that is not absolutely necessary at this time.     Waynetta Sandy MD [facility]

## 2022-10-31 ENCOUNTER — Encounter (HOSPITAL_COMMUNITY): Payer: Self-pay | Admitting: Orthopedic Surgery

## 2022-10-31 MED ORDER — TRANEXAMIC ACID 1000 MG/10ML IV SOLN
2000.0000 mg | INTRAVENOUS | Status: DC
  Filled 2022-10-31 (×2): qty 20

## 2022-10-31 NOTE — Progress Notes (Addendum)
PCP - Dr [name] Cardiologist - Dr Kirk Ruths Nephrology - Dr Otelia Santee  Chest x-ray - 08/22/22 (2V) EKG - 10/05/22  Stress Test - 09/19/20 ECHO - 07/06/21 [facility] Cardiac Cath - 07/29/18  ICD Pacemaker/Loop - n/a  Sleep Study -  Yes CPAP - does not use CPAP  Diabetes Type 2     THE MORNING OF SURGERY, do not take Novolog 70/30 Insulin unless your CBG is greater than 220 mg/dL.  If CBG is greater than 220 mg/dL, you may take 1/2 of your  sliding scale (correction) dose of insulin.  If your blood sugar is less than 70 mg/dL, you will need to treat for low blood sugar: Treat a low blood sugar (less than 70 mg/dL) with  cup of clear juice (cranberry or apple), 4 glucose tablets, OR glucose gel. Recheck blood sugar in 15 minutes after treatment (to make sure it is greater than 70 mg/dL). If your blood sugar is not greater than 70 mg/dL on recheck, call [phone] for further instructions.  Aspirin Instructions: Follow your surgeon's instructions on when to stop aspirin prior to surgery,  If no instructions were given by your surgeon then you will need to call the office for those instructions.  ERAS: Clear liquids til 6:55 AM DOS.  Anesthesia review: Yes  STOP now taking any Aspirin (unless otherwise instructed by your surgeon), Aleve, Naproxen, Ibuprofen, Motrin, Advil, Goody's, BC's, all herbal medications, fish oil, and all vitamins.   Coronavirus Screening Does the patient have any of the following symptoms:  Cough yes/no: No Fever (>100.52F)  yes/no: No Runny nose yes/no: No Sore throat yes/no: No Difficulty breathing/shortness of breath  yes/no: No  Has the patient traveled in the last 14 days and where? yes/no: No  Patient's son Fannie Knee [phone]) verbalized understanding of instructions that were given via phone.

## 2022-11-01 ENCOUNTER — Other Ambulatory Visit: Payer: Self-pay

## 2022-11-01 ENCOUNTER — Encounter (HOSPITAL_COMMUNITY): Payer: Self-pay | Admitting: Orthopedic Surgery

## 2022-11-01 ENCOUNTER — Inpatient Hospital Stay (HOSPITAL_COMMUNITY): Payer: 59

## 2022-11-01 ENCOUNTER — Inpatient Hospital Stay (HOSPITAL_COMMUNITY)
Admission: RE | Admit: 2022-11-01 | Discharge: 2022-11-18 | DRG: 474 | Disposition: E | Payer: 59 | Attending: Orthopedic Surgery | Admitting: Orthopedic Surgery

## 2022-11-01 ENCOUNTER — Inpatient Hospital Stay (HOSPITAL_COMMUNITY): Payer: 59 | Admitting: Emergency Medicine

## 2022-11-01 ENCOUNTER — Encounter (HOSPITAL_COMMUNITY): Admission: RE | Disposition: E | Payer: Self-pay | Source: Home / Self Care | Attending: Orthopedic Surgery

## 2022-11-01 DIAGNOSIS — Z89021 Acquired absence of right finger(s): Secondary | ICD-10-CM | POA: Diagnosis not present

## 2022-11-01 DIAGNOSIS — E1122 Type 2 diabetes mellitus with diabetic chronic kidney disease: Secondary | ICD-10-CM | POA: Diagnosis present

## 2022-11-01 DIAGNOSIS — I252 Old myocardial infarction: Secondary | ICD-10-CM

## 2022-11-01 DIAGNOSIS — G4730 Sleep apnea, unspecified: Secondary | ICD-10-CM | POA: Diagnosis not present

## 2022-11-01 DIAGNOSIS — N186 End stage renal disease: Secondary | ICD-10-CM | POA: Diagnosis not present

## 2022-11-01 DIAGNOSIS — Z794 Long term (current) use of insulin: Secondary | ICD-10-CM

## 2022-11-01 DIAGNOSIS — T8781 Dehiscence of amputation stump: Principal | ICD-10-CM | POA: Diagnosis present

## 2022-11-01 DIAGNOSIS — I340 Nonrheumatic mitral (valve) insufficiency: Secondary | ICD-10-CM | POA: Diagnosis present

## 2022-11-01 DIAGNOSIS — Z955 Presence of coronary angioplasty implant and graft: Secondary | ICD-10-CM | POA: Diagnosis not present

## 2022-11-01 DIAGNOSIS — Z87891 Personal history of nicotine dependence: Secondary | ICD-10-CM

## 2022-11-01 DIAGNOSIS — F419 Anxiety disorder, unspecified: Secondary | ICD-10-CM | POA: Diagnosis present

## 2022-11-01 DIAGNOSIS — K219 Gastro-esophageal reflux disease without esophagitis: Secondary | ICD-10-CM | POA: Diagnosis present

## 2022-11-01 DIAGNOSIS — Z89612 Acquired absence of left leg above knee: Secondary | ICD-10-CM | POA: Diagnosis not present

## 2022-11-01 DIAGNOSIS — Y835 Amputation of limb(s) as the cause of abnormal reaction of the patient, or of later complication, without mention of misadventure at the time of the procedure: Secondary | ICD-10-CM | POA: Diagnosis present

## 2022-11-01 DIAGNOSIS — I120 Hypertensive chronic kidney disease with stage 5 chronic kidney disease or end stage renal disease: Secondary | ICD-10-CM | POA: Diagnosis present

## 2022-11-01 DIAGNOSIS — Z888 Allergy status to other drugs, medicaments and biological substances status: Secondary | ICD-10-CM

## 2022-11-01 DIAGNOSIS — F418 Other specified anxiety disorders: Secondary | ICD-10-CM

## 2022-11-01 DIAGNOSIS — Z96642 Presence of left artificial hip joint: Secondary | ICD-10-CM | POA: Diagnosis not present

## 2022-11-01 DIAGNOSIS — Z89422 Acquired absence of other left toe(s): Secondary | ICD-10-CM

## 2022-11-01 DIAGNOSIS — Z992 Dependence on renal dialysis: Secondary | ICD-10-CM | POA: Diagnosis not present

## 2022-11-01 DIAGNOSIS — E1151 Type 2 diabetes mellitus with diabetic peripheral angiopathy without gangrene: Secondary | ICD-10-CM | POA: Diagnosis present

## 2022-11-01 DIAGNOSIS — M1990 Unspecified osteoarthritis, unspecified site: Secondary | ICD-10-CM | POA: Diagnosis not present

## 2022-11-01 DIAGNOSIS — Z89511 Acquired absence of right leg below knee: Secondary | ICD-10-CM

## 2022-11-01 DIAGNOSIS — I4891 Unspecified atrial fibrillation: Secondary | ICD-10-CM | POA: Diagnosis not present

## 2022-11-01 DIAGNOSIS — Z6820 Body mass index (BMI) 20.0-20.9, adult: Secondary | ICD-10-CM

## 2022-11-01 DIAGNOSIS — I2510 Atherosclerotic heart disease of native coronary artery without angina pectoris: Secondary | ICD-10-CM | POA: Diagnosis present

## 2022-11-01 DIAGNOSIS — S78119A Complete traumatic amputation at level between unspecified hip and knee, initial encounter: Principal | ICD-10-CM | POA: Diagnosis present

## 2022-11-01 DIAGNOSIS — Z8673 Personal history of transient ischemic attack (TIA), and cerebral infarction without residual deficits: Secondary | ICD-10-CM | POA: Diagnosis not present

## 2022-11-01 DIAGNOSIS — E43 Unspecified severe protein-calorie malnutrition: Secondary | ICD-10-CM | POA: Diagnosis present

## 2022-11-01 HISTORY — PX: STUMP REVISION: SHX6102

## 2022-11-01 LAB — POCT I-STAT EG7
Acid-base deficit: 7 mmol/L — ABNORMAL HIGH (ref 0.0–2.0)
Bicarbonate: 24.4 mmol/L (ref 20.0–28.0)
Calcium, Ion: 1.19 mmol/L (ref 1.15–1.40)
HCT: 32 % — ABNORMAL LOW (ref 39.0–52.0)
Hemoglobin: 10.9 g/dL — ABNORMAL LOW (ref 13.0–17.0)
O2 Saturation: 22 %
Potassium: 4.3 mmol/L (ref 3.5–5.1)
Sodium: 137 mmol/L (ref 135–145)
TCO2: 27 mmol/L (ref 22–32)
pCO2, Ven: 82.6 mmHg (ref 44–60)
pH, Ven: 7.078 — CL (ref 7.25–7.43)
pO2, Ven: 23 mmHg — CL (ref 32–45)

## 2022-11-01 LAB — POCT I-STAT, CHEM 8
BUN: 60 mg/dL — ABNORMAL HIGH (ref 6–20)
Calcium, Ion: 1.04 mmol/L — ABNORMAL LOW (ref 1.15–1.40)
Chloride: 97 mmol/L — ABNORMAL LOW (ref 98–111)
Creatinine, Ser: 7.2 mg/dL — ABNORMAL HIGH (ref 0.61–1.24)
Glucose, Bld: 334 mg/dL — ABNORMAL HIGH (ref 70–99)
HCT: 33 % — ABNORMAL LOW (ref 39.0–52.0)
Hemoglobin: 11.2 g/dL — ABNORMAL LOW (ref 13.0–17.0)
Potassium: 3.9 mmol/L (ref 3.5–5.1)
Sodium: 137 mmol/L (ref 135–145)
TCO2: 28 mmol/L (ref 22–32)

## 2022-11-01 LAB — GLUCOSE, CAPILLARY
Glucose-Capillary: 305 mg/dL — ABNORMAL HIGH (ref 70–99)
Glucose-Capillary: 383 mg/dL — ABNORMAL HIGH (ref 70–99)
Glucose-Capillary: 205 mg/dL — ABNORMAL HIGH (ref 70–99)

## 2022-11-01 LAB — ECHO INTRAOPERATIVE TEE: Height: 72 in

## 2022-11-01 SURGERY — REVISION, AMPUTATION SITE
Anesthesia: General | Site: Leg Upper | Laterality: Left

## 2022-11-01 MED ORDER — CEFAZOLIN SODIUM-DEXTROSE 2-4 GM/100ML-% IV SOLN
2.0000 g | INTRAVENOUS | Status: AC
  Administered 2022-11-01: 2 g via INTRAVENOUS
  Filled 2022-11-01: qty 100

## 2022-11-01 MED ORDER — PHENYLEPHRINE 80 MCG/ML (10ML) SYRINGE FOR IV PUSH (FOR BLOOD PRESSURE SUPPORT)
PREFILLED_SYRINGE | INTRAVENOUS | Status: DC | PRN
  Administered 2022-11-01: 80 ug via INTRAVENOUS
  Administered 2022-11-01: 160 ug via INTRAVENOUS
  Administered 2022-11-01: 80 ug via INTRAVENOUS
  Administered 2022-11-01: 240 ug via INTRAVENOUS

## 2022-11-01 MED ORDER — LACTATED RINGERS IV SOLN: INTRAVENOUS | Status: DC

## 2022-11-01 MED ORDER — EPINEPHRINE 1 MG/10ML IJ SOSY
PREFILLED_SYRINGE | INTRAMUSCULAR | Status: DC | PRN
  Administered 2022-11-01: 780 ug via INTRAVENOUS
  Administered 2022-11-01 (×2): 500 ug via INTRAVENOUS
  Administered 2022-11-01 (×2): 10 ug via INTRAVENOUS
  Administered 2022-11-01: 200 ug via INTRAVENOUS

## 2022-11-01 MED ORDER — RENA-VITE PO TABS: 1.0000 | ORAL_TABLET | Freq: Every day | ORAL | Status: DC

## 2022-11-01 MED ORDER — OXYCODONE HCL 5 MG PO TABS: 5.0000 mg | ORAL_TABLET | Freq: Once | ORAL | Status: DC | PRN

## 2022-11-01 MED ORDER — MILRINONE LACTATE IN DEXTROSE 20-5 MG/100ML-% IV SOLN
INTRAVENOUS | Status: DC | PRN
  Administered 2022-11-01: .25 ug/kg/min via INTRAVENOUS

## 2022-11-01 MED ORDER — PROPOFOL 10 MG/ML IV BOLUS
INTRAVENOUS | Status: AC
  Filled 2022-11-01: qty 20

## 2022-11-01 MED ORDER — FENTANYL CITRATE (PF) 250 MCG/5ML IJ SOLN
INTRAMUSCULAR | Status: AC
  Filled 2022-11-01: qty 5

## 2022-11-01 MED ORDER — FENTANYL CITRATE (PF) 250 MCG/5ML IJ SOLN
INTRAMUSCULAR | Status: DC | PRN
  Administered 2022-11-01: 25 ug via INTRAVENOUS

## 2022-11-01 MED ORDER — CHLORHEXIDINE GLUCONATE 0.12 % MT SOLN
15.0000 mL | Freq: Once | OROMUCOSAL | Status: AC
  Administered 2022-11-01: 15 mL via OROMUCOSAL
  Filled 2022-11-01: qty 15

## 2022-11-01 MED ORDER — INSULIN ASPART 100 UNIT/ML IJ SOLN: 0.0000 [IU] | INTRAMUSCULAR | Status: DC | PRN

## 2022-11-01 MED ORDER — SODIUM CHLORIDE 0.9 % IV SOLN: INTRAVENOUS | Status: DC | PRN

## 2022-11-01 MED ORDER — 0.9 % SODIUM CHLORIDE (POUR BTL) OPTIME
TOPICAL | Status: DC | PRN
  Administered 2022-11-01: 1000 mL

## 2022-11-01 MED ORDER — SODIUM BICARBONATE 8.4 % IV SOLN
INTRAVENOUS | Status: DC | PRN
  Administered 2022-11-01: 50 meq via INTRAVENOUS

## 2022-11-01 MED ORDER — VASOPRESSIN 20 UNIT/ML IV SOLN
INTRAVENOUS | Status: DC | PRN
  Administered 2022-11-01: 2 [IU] via INTRAVENOUS

## 2022-11-01 MED ORDER — MIDAZOLAM HCL 2 MG/2ML IJ SOLN
INTRAMUSCULAR | Status: AC
  Filled 2022-11-01: qty 2

## 2022-11-01 MED ORDER — INSULIN ASPART 100 UNIT/ML IJ SOLN: 12.0000 [IU] | Freq: Once | INTRAMUSCULAR | Status: AC

## 2022-11-01 MED ORDER — POLYETHYLENE GLYCOL 3350 17 G PO PACK: 17.0000 g | PACK | Freq: Every day | ORAL | Status: DC | PRN

## 2022-11-01 MED ORDER — LIDOCAINE 2% (20 MG/ML) 5 ML SYRINGE
INTRAMUSCULAR | Status: DC | PRN
  Administered 2022-11-01: 60 mg via INTRAVENOUS

## 2022-11-01 MED ORDER — OXYCODONE HCL 5 MG/5ML PO SOLN: 5.0000 mg | Freq: Once | ORAL | Status: DC | PRN

## 2022-11-01 MED ORDER — MORPHINE SULFATE (PF) 2 MG/ML IV SOLN: 1.0000 mg | INTRAVENOUS | Status: DC | PRN

## 2022-11-01 MED ORDER — CHLORHEXIDINE GLUCONATE CLOTH 2 % EX PADS: 6.0000 | MEDICATED_PAD | Freq: Every day | CUTANEOUS | Status: DC

## 2022-11-01 MED ORDER — NEPRO/CARBSTEADY PO LIQD: 237.0000 mL | Freq: Two times a day (BID) | ORAL | Status: DC

## 2022-11-01 MED ORDER — ENOXAPARIN SODIUM 30 MG/0.3ML IJ SOSY: 30.0000 mg | PREFILLED_SYRINGE | INTRAMUSCULAR | Status: DC

## 2022-11-01 MED ORDER — SUCROFERRIC OXYHYDROXIDE 500 MG PO CHEW: 1500.0000 mg | CHEWABLE_TABLET | Freq: Three times a day (TID) | ORAL | Status: DC

## 2022-11-01 MED ORDER — SODIUM CHLORIDE 0.9 % IV SOLN: 62.5000 mg | Freq: Once | INTRAVENOUS | Status: DC

## 2022-11-01 MED ORDER — DOCUSATE SODIUM 100 MG PO CAPS: 100.0000 mg | ORAL_CAPSULE | Freq: Two times a day (BID) | ORAL | Status: DC | PRN

## 2022-11-01 MED ORDER — ORAL CARE MOUTH RINSE: 15.0000 mL | Freq: Once | OROMUCOSAL | Status: AC

## 2022-11-01 MED ORDER — VASOPRESSIN 20 UNIT/ML IV SOLN
INTRAVENOUS | Status: AC
  Filled 2022-11-01: qty 1

## 2022-11-01 MED ORDER — EPHEDRINE SULFATE-NACL 50-0.9 MG/10ML-% IV SOSY
PREFILLED_SYRINGE | INTRAVENOUS | Status: DC | PRN
  Administered 2022-11-01: 10 mg via INTRAVENOUS

## 2022-11-01 MED ORDER — EPINEPHRINE HCL 5 MG/250ML IV SOLN IN NS
INTRAVENOUS | Status: DC | PRN
  Administered 2022-11-01: 4 ug/min via INTRAVENOUS

## 2022-11-01 MED ORDER — ONDANSETRON HCL 4 MG/2ML IJ SOLN
INTRAMUSCULAR | Status: DC | PRN
  Administered 2022-11-01: 4 mg via INTRAVENOUS

## 2022-11-01 MED ORDER — INSULIN ASPART 100 UNIT/ML IJ SOLN: 10.0000 [IU] | Freq: Once | INTRAMUSCULAR | Status: DC

## 2022-11-01 MED ORDER — PROPOFOL 10 MG/ML IV BOLUS
INTRAVENOUS | Status: DC | PRN
  Administered 2022-11-01: 140 mg via INTRAVENOUS

## 2022-11-01 MED ORDER — DOXERCALCIFEROL 4 MCG/2ML IV SOLN: 2.0000 ug | INTRAVENOUS | Status: DC

## 2022-11-01 MED ORDER — SUCROFERRIC OXYHYDROXIDE 500 MG PO CHEW: 1000.0000 mg | CHEWABLE_TABLET | ORAL | Status: DC

## 2022-11-01 MED ORDER — TRANEXAMIC ACID-NACL 1000-0.7 MG/100ML-% IV SOLN
1000.0000 mg | INTRAVENOUS | Status: AC
  Administered 2022-11-01: 1000 mg via INTRAVENOUS
  Filled 2022-11-01: qty 100

## 2022-11-01 MED ORDER — INSULIN ASPART 100 UNIT/ML IJ SOLN
INTRAMUSCULAR | Status: AC
  Administered 2022-11-01: 12 [IU] via SUBCUTANEOUS
  Filled 2022-11-01: qty 1

## 2022-11-01 MED ORDER — PROSOURCE PLUS PO LIQD: 30.0000 mL | Freq: Two times a day (BID) | ORAL | Status: DC

## 2022-11-01 MED ORDER — INSULIN ASPART 100 UNIT/ML IJ SOLN
INTRAMUSCULAR | Status: AC
  Administered 2022-11-01: 10 [IU] via SUBCUTANEOUS
  Filled 2022-11-01: qty 1

## 2022-11-01 SURGICAL SUPPLY — 34 items
BAG COUNTER SPONGE SURGICOUNT (BAG) ×2 IMPLANT
BAG SPNG CNTER NS LX DISP (BAG) ×1
BLADE SAW RECIP 87.9 MT (BLADE) IMPLANT
BLADE SURG 21 STRL SS (BLADE) ×2 IMPLANT
CANISTER WOUND CARE 500ML ATS (WOUND CARE) ×2 IMPLANT
COVER SURGICAL LIGHT HANDLE (MISCELLANEOUS) ×1 IMPLANT
DRAPE DERMATAC (DRAPES) IMPLANT
DRAPE EXTREMITY T 121X128X90 (DISPOSABLE) ×2 IMPLANT
DRAPE HALF SHEET 40X57 (DRAPES) ×1 IMPLANT
DRAPE INCISE IOBAN 66X45 STRL (DRAPES) ×1 IMPLANT
DRAPE U-SHAPE 47X51 STRL (DRAPES) ×4 IMPLANT
DRESSING PREVENA PLUS CUSTOM (GAUZE/BANDAGES/DRESSINGS) ×1 IMPLANT
DRSG PREVENA PLUS CUSTOM (GAUZE/BANDAGES/DRESSINGS) ×1
DURAPREP 26ML APPLICATOR (WOUND CARE) ×2 IMPLANT
ELECT REM PT RETURN 9FT ADLT (ELECTROSURGICAL) ×1
ELECTRODE REM PT RTRN 9FT ADLT (ELECTROSURGICAL) ×1 IMPLANT
GLOVE BIOGEL PI IND STRL 9 (GLOVE) ×1 IMPLANT
GLOVE SURG ORTHO 9.0 STRL STRW (GLOVE) ×2 IMPLANT
GOWN STRL REUS W/ TWL XL LVL3 (GOWN DISPOSABLE) ×4 IMPLANT
GOWN STRL REUS W/TWL XL LVL3 (GOWN DISPOSABLE) ×2
KIT BASIN OR (CUSTOM PROCEDURE TRAY) ×1 IMPLANT
KIT TURNOVER KIT B (KITS) ×1 IMPLANT
MANIFOLD NEPTUNE II (INSTRUMENTS) ×1 IMPLANT
NS IRRIG 1000ML POUR BTL (IV SOLUTION) ×1 IMPLANT
PACK GENERAL/GYN (CUSTOM PROCEDURE TRAY) ×2 IMPLANT
PAD ARMBOARD 7.5X6 YLW CONV (MISCELLANEOUS) ×1 IMPLANT
PREVENA RESTOR ARTHOFORM 46X30 (CANNISTER) ×1 IMPLANT
STAPLER VISISTAT 35W (STAPLE) IMPLANT
SUT ETHILON 2 0 PSLX (SUTURE) ×4 IMPLANT
SUT SILK 2 0 (SUTURE)
SUT SILK 2 0 TIES 17X18 (SUTURE) ×1
SUT SILK 2-0 18XBRD TIE 12 (SUTURE) IMPLANT
SUT SILK 2-0 18XBRD TIE BLK (SUTURE) IMPLANT
TOWEL GREEN STERILE (TOWEL DISPOSABLE) ×2 IMPLANT

## 2022-11-04 ENCOUNTER — Encounter (HOSPITAL_COMMUNITY): Payer: Self-pay | Admitting: Orthopedic Surgery

## 2022-11-18 NOTE — Anesthesia Procedure Notes (Addendum)
Procedure Name: Intubation Date/Time: Nov 23, 2022 10:58 AM  Performed by: Richardean Canal T, CRNALaryngoscope Size: Mac and 4 Grade View: Grade II Tube size: 7.0 mm Number of attempts: 1 Airway Equipment and Method: Stylet Placement Confirmation: ETT inserted through vocal cords under direct vision Secured at: 23 cm Tube secured with: Tape Dental Injury: Teeth and Oropharynx as per pre-operative assessment

## 2022-11-18 NOTE — Transfer of Care (Signed)
Immediate Anesthesia Transfer of Care Note  Patient: Edward [name]  Procedure(s) Performed: REVISION LEFT ABOVE KNEE AMPUTATION (Left: Leg Upper)  Pt deceased

## 2022-11-18 NOTE — Interval H&P Note (Signed)
History and Physical Interval Note:  Nov 03, 2022 8:32 AM  Edward [name]  has presented today for surgery, with the diagnosis of Dehiscence Left Above Knee Amputation.  The various methods of treatment have been discussed with the patient and family. After consideration of risks, benefits and other options for treatment, the patient has consented to  Procedure(s): REVISION LEFT ABOVE KNEE AMPUTATION (Left) as a surgical intervention.  The patient's history has been reviewed, patient examined, no change in status, stable for surgery.  I have reviewed the patient's chart and labs.  Questions were answered to the patient's satisfaction.     [name]

## 2022-11-18 NOTE — Discharge Summary (Signed)
Patient presented to the hospital for revision of a left above-the-knee amputation.  Patient had multiple medical problems including end-stage renal disease on dialysis, type 2 diabetes with severe peripheral vascular disease,  bilateral above-the-knee amputation , amputation of his fingers with ischemic dry gangrenous changes of the remaining 3 fingers.  Patient is also status post coronary artery stents for coronary artery disease, with history of tobacco use, severe protein caloric malnutrition, hypertension.  At the completion of the patient's revision of his left above-the-knee amputation patient developed pulseless electrical activity.  Patient underwent full code with chest compressions and treatment with IV medication.  Patient's heart rhythm was restored and then he developed pulseless electrical activity again.  Patient underwent full CPR with chest compressions and IV medication.  Pulse was reestablished.  A third time patient developed pulseless electrical activity a transesophageal echo showed pulseless electrical activity without ventricle contracture.  After 3 episodes of CPR and persistent pulseless electrical activity without ventricular contracture, patient was declared deceased at 11:45 AM.  This was confirmed by the orthopedic surgeon and 2 anesthesiologists.

## 2022-11-18 NOTE — Consult Note (Addendum)
Please note patient was not seen today by myself or extender as consult called while he was still in the OR.  The above note was entered by extender to communicate history and his outpatient HD orders.    Noted patient unfortunately expired in the OR earlier today.  Appreciate teams involved in the care of the patient and staff support of his family at this time.  Claudia Desanctis, MD 5:51 PM 11/04/22

## 2022-11-18 NOTE — Progress Notes (Signed)
   Nov 10, 2022 1245  Spiritual Encounters  Type of Visit Initial  Care provided to: [facility] partners present during encounter Nurse  Referral source Nurse (RN/NT/LPN)  Reason for visit Patient death  OnCall Visit No   Melven Sartorius responded to page regarding Pt death and met with family in a room in PACU.  2 sons present, [name] and Fannie Knee, as well as older sister, and mother-in-law.  More family arrived later.  Chap provided compassionate presence with compassionate touch and reflective listening.  Melven Sartorius asked family to tell him about their loved one, and helped them begin the grief process by sharing memories and story telling.  Melven Sartorius offered prayed over Pt and family.  When more family arrived, Melven Sartorius stepped away to allow family time together.

## 2022-11-18 NOTE — Anesthesia Postprocedure Evaluation (Signed)
Anesthesia Post Note  Patient: Edward [name]  Procedure(s) Performed: REVISION LEFT ABOVE KNEE AMPUTATION (Left: Leg Upper)     Anesthesia Type: General Comments: Patient expired in the OR   No notable events documented.  Last Vitals:  Vitals:   11-28-22 0755  BP: 96/79  Pulse: 60  Resp: 20  Temp: 36.7 C  SpO2: 95%    Last Pain:  Vitals:   11-28-22 0906  TempSrc:   PainSc: 10-Worst pain ever                 [name]

## 2022-11-18 NOTE — OR Nursing (Signed)
Patient belongs sent with patient's family.

## 2022-11-18 NOTE — H&P (Signed)
Edward [name] is an 52 y.o. male.   Chief Complaint: Progressive ulceration and wound dehiscence left above-knee amputation HPI: The patient is a 52 year old gentleman seen status post left above-knee amputation 4 weeks ago. Presents today concern for some blackened discoloration to the entire incision. Staples are in place. She is having some drainage as well   Past Medical History:  Diagnosis Date   Allergy    Anemia    getting Venofer with dialysis   Anxiety    self reported, sometimes-situational anxiety   Arthritis    bilateral hands/LEFT hip   CHF (congestive heart failure) (HCC)    Chronic kidney disease    t th s dialysis- [place] ---ESRD   Coronary artery disease    Depression    self reported, sometimes-situational depression   Diabetes mellitus    type II- on meds   Dyspnea    sometimes- fluid   GERD (gastroesophageal reflux disease)    on meds   History of blood transfusion    Hypertension    no longer on medication since starting on Hemodialysis   Myocardial infarction ([place]) 2019   Nausea and vomiting    Peripheral vascular disease ([place])    Sleep apnea    does not use CPAP    Past Surgical History:  Procedure Laterality Date   A/V FISTULAGRAM Left 03/06/2020   Procedure: A/V FISTULAGRAM;  Surgeon: Waynetta Sandy, MD;  Location: [facility];  Service: Cardiovascular;  Laterality: Left;   ABDOMINAL AORTOGRAM N/A 01/07/2022   Procedure: ABDOMINAL AORTOGRAM;  Surgeon: Waynetta Sandy, MD;  Location: [facility];  Service: Cardiovascular;  Laterality: N/A;   ABDOMINAL AORTOGRAM W/LOWER EXTREMITY N/A 04/09/2021   Procedure: ABDOMINAL AORTOGRAM W/LOWER EXTREMITY;  Surgeon: Waynetta Sandy, MD;  Location: [facility];  Service: Cardiovascular;  Laterality: N/A;   AMPUTATION Left 05/10/2016   Procedure: Left Transmetatarsal Amputation;  Surgeon: [name], MD;  Location: [place];  Service: Orthopedics;  Laterality:  Left;   AMPUTATION Right 05/16/2018   Procedure: PARTIAL RAY AMPUTATION FIFTH TOE RIGHT FOOT;  Surgeon: [name], DPM;  Location: [place];  Service: Podiatry;  Laterality: Right;   AMPUTATION Right 06/17/2018   Procedure: AMPUTATION 4th RAY Right Foot;  Surgeon: Trula Slade, DPM;  Location: [place];  Service: Podiatry;  Laterality: Right;   AMPUTATION Right 06/19/2018   Procedure: RIGHT BELOW KNEE AMPUTATION;  Surgeon: [name], MD;  Location: [place];  Service: Orthopedics;  Laterality: Right;   AMPUTATION Left 10/05/2021   Procedure: LEFT RING FINGER AMPUTATION;  Surgeon: Daryll Brod, MD;  Location: [place];  Service: Orthopedics;  Laterality: Left;  45 MIN   AMPUTATION Left 11/15/2021   Procedure: LEFT INDEX FINGER AMPUTATION;  Surgeon: Leanora Cover, MD;  Location: [place];  Service: Orthopedics;  Laterality: Left;  60 MIN   AMPUTATION Right 01/08/2022   Procedure: AMPUTATION ABOVE KNEE;  Surgeon: Waynetta Sandy, MD;  Location: [place];  Service: Vascular;  Laterality: Right;   AMPUTATION Bilateral 01/12/2022   Procedure: LEFT INDEX FINGER AMPUTATION, RIGHT INDEX, LONG AND SMALL DIGITS AMPUTATION;  Surgeon: Leanora Cover, MD;  Location: [place];  Service: Orthopedics;  Laterality: Bilateral;   AMPUTATION Right 04/01/2022   Procedure: RIGHT INDEX FINGER AMPUTATION AT METACARPAL PHALANGEAL JOINT, INCISION AND DRAINAGE RIGHT HAND;  Surgeon: Leanora Cover, MD;  Location: [place];  Service: Orthopedics;  Laterality: Right;  90 MIN   AMPUTATION Right 05/23/2022  Procedure: RIGHT INDEX FINGER RAY AMPUTATION; RIGHT LONG, RING, AND SMALL FINGER AMPUTATIONS;  Surgeon: Leanora Cover, MD;  Location: [place];  Service: Orthopedics;  Laterality: Right;  90 MIN   AMPUTATION Left 08/07/2022   Procedure: LEFT ABOVE KNEE AMPUTATION;  Surgeon: [name], MD;  Location: [place];  Service: Orthopedics;  Laterality: Left;   AORTIC ARCH ANGIOGRAPHY N/A 01/07/2022   Procedure: AORTIC ARCH ANGIOGRAPHY;   Surgeon: Waynetta Sandy, MD;  Location: [facility];  Service: Cardiovascular;  Laterality: N/A;   APPLICATION OF WOUND VAC Right 06/19/2018   Procedure: APPLICATION OF WOUND VAC;  Surgeon: [name], MD;  Location: [place];  Service: Orthopedics;  Laterality: Right;   APPLICATION OF WOUND VAC Left 08/07/2022   Procedure: APPLICATION OF WOUND VAC;  Surgeon: [name], MD;  Location: [place];  Service: Orthopedics;  Laterality: Left;   AV FISTULA PLACEMENT Left 11/25/2019   Procedure: LEFT ARM Brachiocephalic  ARTERIOVENOUS (AV) FISTULA CREATION;  Surgeon: Rosetta Posner, MD;  Location: [place];  Service: Vascular;  Laterality: Left;   [place] Left 07/19/2020   Procedure: LEFT SECOND STAGE [place];  Surgeon: Waynetta Sandy, MD;  Location: [place];  Service: Vascular;  Laterality: Left;   CIRCUMCISION  09/02/2011   Procedure: CIRCUMCISION ADULT;  Surgeon: Molli Hazard, MD;  Location: [place];  Service: Urology;  Laterality: N/A;   CORONARY STENT INTERVENTION N/A 07/29/2018   Procedure: CORONARY STENT INTERVENTION;  Surgeon: Leonie Man, MD;  Location: [facility];  Service: Cardiovascular;  Laterality: N/A;   DEBRIDEMENT AND CLOSURE WOUND Left 11/15/2021   Procedure: LEFT HAND WOUND CLOSURE;  Surgeon: Leanora Cover, MD;  Location: [place];  Service: Orthopedics;  Laterality: Left;   I & D EXTREMITY  09/02/2011   Procedure: IRRIGATION AND DEBRIDEMENT EXTREMITY;  Surgeon: Theodoro Kos, DO;  Location: [place];  Service: Plastics;  Laterality: N/A;   INCISION AND DRAINAGE OF WOUND  08/12/2011   Procedure: IRRIGATION AND DEBRIDEMENT WOUND;  Surgeon: Zenovia Jarred, MD;  Location: [place];  Service: General;;  perineum   INSERTION OF DIALYSIS CATHETER N/A 11/25/2019   Procedure: INSERTION OF Righ Internal Jugular DIALYSIS CATHETER;  Surgeon: Rosetta Posner, MD;  Location: [place];   Service: Vascular;  Laterality: N/A;   [place]  08/12/2011   Procedure: IRRIGATION AND DEBRIDEMENT ABSCESS;  Surgeon: Molli Hazard, MD;  Location: [place];  Service: Urology;  Laterality: N/A;  irrigation and debridment perineum     IRRIGATION AND DEBRIDEMENT ABSCESS  08/14/2011   Procedure: MINOR INCISION AND DRAINAGE OF ABSCESS;  Surgeon: Molli Hazard, MD;  Location: [place];  Service: Urology;  Laterality: N/A;  Perineal Wound Debridement;Placement Bilateral Testicular Thigh Pouches   LEFT HEART CATH AND CORONARY ANGIOGRAPHY N/A 07/29/2018   Procedure: LEFT HEART CATH AND CORONARY ANGIOGRAPHY;  Surgeon: Leonie Man, MD;  Location: [facility];  Service: Cardiovascular;  Laterality: N/A;   LOWER EXTREMITY ANGIOGRAPHY Bilateral 01/07/2022   Procedure: Lower Extremity Angiography;  Surgeon: Waynetta Sandy, MD;  Location: [facility];  Service: Cardiovascular;  Laterality: Bilateral;   PERIPHERAL VASCULAR ATHERECTOMY Left 04/09/2021   Procedure: PERIPHERAL VASCULAR ATHERECTOMY;  Surgeon: Waynetta Sandy, MD;  Location: [facility];  Service: Cardiovascular;  Laterality: Left;  TP trunk and peroneal arteries   PERIPHERAL VASCULAR BALLOON ANGIOPLASTY Left 04/09/2021  Procedure: PERIPHERAL VASCULAR BALLOON ANGIOPLASTY;  Surgeon: Waynetta Sandy, MD;  Location: [facility];  Service: Cardiovascular;  Laterality: Left;  Popliteal   REVISON OF ARTERIOVENOUS FISTULA Left 05/24/2020   Procedure: LEFT ARM ARTERIOVENOUS FISTULA  CONVERSION TO BASILIC VEIN FISTULA;  Surgeon: Waynetta Sandy, MD;  Location: [place];  Service: Vascular;  Laterality: Left;   STUMP REVISION Left 09/20/2022   Procedure: LEFT ABOVE THE KNEE AMPUTATION REVISION;  Surgeon: [name], MD;  Location: [place];  Service: Orthopedics;  Laterality: Left;   TOTAL HIP ARTHROPLASTY Left 10/11/2016   Procedure: LEFT TOTAL HIP  ARTHROPLASTY ANTERIOR APPROACH;  Surgeon: Mcarthur Rossetti, MD;  Location: [facility];  Service: Orthopedics;  Laterality: Left;   UPPER EXTREMITY ANGIOGRAPHY Bilateral 01/07/2022   Procedure: Upper Extremity Angiography;  Surgeon: Waynetta Sandy, MD;  Location: [facility];  Service: Cardiovascular;  Laterality: Bilateral;    Family History  Problem Relation Age of Onset   Diabetes Other    Pancreatic cancer Mother 59   Colon cancer Neg Hx    Colon polyps Neg Hx    Esophageal cancer Neg Hx    Rectal cancer Neg Hx    Stomach cancer Neg Hx    Social History:  reports that he quit smoking about 2 years ago. His smoking use included cigarettes. He has a 6.00 pack-year smoking history. He has never used smokeless tobacco. He reports that he does not currently use alcohol. He reports that he does not use drugs.  Allergies:  Allergies  Allergen Reactions   Crestor [Rosuvastatin] Rash    Eye redness   Dilaudid [Hydromorphone] Itching   Robaxin [Methocarbamol] Other (See Comments)    Pt states medication made his arm pain worse.   Benadryl [Diphenhydramine] Rash    No medications prior to admission.    No results found for this or any previous visit (from the past 48 hour(s)). No results found.  Review of Systems  All other systems reviewed and are negative.   Height 6' (1.829 m), weight 69.8 kg. Physical Exam  On examination of the left above-knee amputation and staples in place there is ischemic changes to the anterior portion of his incision unfortunately this has dehisced there is purulent present. The medial two thirds of the incision is open with exposed bone  Assessment/Plan Assessment: Dehiscence left above-knee amputation with ischemic changes.  Plan: Will plan for revision of the left above-knee amputation.  Risk and benefits were discussed including risk of the wound not healing need for additional surgery.  Patient states he understands and wishes  to proceed at this time.  [name], MD 2022/11/14, 6:45 AM

## 2022-11-18 NOTE — Anesthesia Preprocedure Evaluation (Addendum)
Anesthesia Evaluation  Patient identified by MRN, date of birth, ID band Patient awake    Reviewed: Allergy & Precautions, NPO status , Patient's Chart, lab work & pertinent test results  History of Anesthesia Complications Negative for: history of anesthetic complications  Airway Mallampati: II  TM Distance: >3 FB Neck ROM: Full    Dental  (+) Dental Advisory Given, Missing   Pulmonary shortness of breath, sleep apnea (noncompliant) , former smoker   Pulmonary exam normal breath sounds clear to auscultation       Cardiovascular hypertension, + CAD, + Past MI, + Cardiac Stents, + Peripheral Vascular Disease and +CHF  Normal cardiovascular exam+ Valvular Problems/Murmurs MR  Rhythm:Regular Rate:Normal  EKG 10/05/22 NSR, 1st deg AVB, LAFB, anterolateral MI, baseline artifact, prolonged QTc  Echo 09/26/20 1. Abnormal septal motion Distal septal / apical hypokinesis . Left  ventricular ejection fraction, by estimation, is 50 to 55%. The left  ventricle has low normal function. The left ventricle demonstrates  regional wall motion abnormalities (see scoring  diagram/findings for description). The left ventricular internal cavity  size was mildly dilated. There is mild left ventricular hypertrophy. Left  ventricular diastolic parameters are consistent with Grade II diastolic  dysfunction (pseudonormalization).  Elevated left ventricular end-diastolic pressure.   2. Right ventricular systolic function is normal. The right ventricular  size is normal. There is moderately elevated pulmonary artery systolic  pressure.   3. Left atrial size was moderately dilated.   4. The pericardial effusion is posterior to the left ventricle.   5. Carpentier class 3B restricted posterior leaflet motion . The mitral  valve is abnormal. Moderate to severe mitral valve regurgitation. No  evidence of mitral stenosis.   6. The aortic valve is tricuspid.  Aortic valve regurgitation is mild. No  aortic stenosis is present.   7. The inferior vena cava is normal in size with greater than 50%  respiratory variability, suggesting right atrial pressure of 3 mmHg.     Neuro/Psych  PSYCHIATRIC DISORDERS Anxiety Depression    TIA Neuromuscular disease    GI/Hepatic Neg liver ROS,GERD  Medicated and Controlled,,  Endo/Other  diabetes, Poorly Controlled, Type 2, Insulin Dependent  Morbid obesityHyperlipidemia CBG 334 mg% this am on iSTAT  Renal/GU ESRF and DialysisRenal diseaseLast dialysis Tuesday- Patient did not go for dialysis yesterday K+ 3.9 this am  negative genitourinary   Musculoskeletal  (+) Arthritis , Osteoarthritis,  S/P left AKA with open wound for revision today S/P numerous amputations   Abdominal   Peds  Hematology  (+) Blood dyscrasia, anemia   Anesthesia Other Findings   Reproductive/Obstetrics                             Anesthesia Physical Anesthesia Plan  ASA: 4  Anesthesia Plan: General   Post-op Pain Management: Ketamine IV*, Tylenol PO (pre-op)* and Dilaudid IV   Induction: Intravenous  PONV Risk Score and Plan: 2 and Treatment may vary due to age or medical condition, Ondansetron, Dexamethasone, Midazolam and Propofol infusion  Airway Management Planned: LMA  Additional Equipment: None  Intra-op Plan:   Post-operative Plan: Extubation in OR  Informed Consent: I have reviewed the patients History and Physical, chart, labs and discussed the procedure including the risks, benefits and alternatives for the proposed anesthesia with the patient or authorized representative who has indicated his/her understanding and acceptance.     Dental advisory given  Plan Discussed with: CRNA and Anesthesiologist  Anesthesia Plan Comments: (PAT note by Karoline Caldwell, PA-C: Follows with cardiology for history ofCAD with prior PCI (LCX, OM, LAD) HFrEF 40-45%, mild AI, Severe MR and TR,  Prior TIA, PAD with prior BKAand left transmetatarsal imitation as well as multiple amputations,hx of Mobitz I HB.He now has dehiscence of left AKA.  ESRD on HD Tuesday Thursday Saturday.  Patient will need day of surgery labs and evaluation.  EKG 08/12/2022: Sinus rhythm with first-degree AV block.  Left axis deviation.  Anterolateral infarct.  Rate 88.  Event monitor 01/01/2022: Sinus bradycardia, NSR, occasional PAC, brief PAT, 5 beats NSVT and occasional Mobitz 1 second degree AV block.  TTE 07/06/21 (care everywhere): MILD LV DYSFUNCTION (See above, EF 40 to 45%) WITH MODERATE LVH  ELEVATED LA PRESSURES WITH DIASTOLIC DYSFUNCTION  MILD RV SYSTOLIC DYSFUNCTION (See above)  VALVULAR REGURGITATION: MILD AR, SEVERE MR, MILD PR, SEVERE TR  NO VALVULAR STENOSIS  TRIVIAL PERICARDIAL EFFUSION  SEVERE TR WITH SYSTOLIC FLOW REVERSAL IN HEPATIC VEIN. ERO 0.4 cm2, RV 33  mL.  SEVERE MR WITH FLOW REVERSAL IN PULMONARY VEINS. ERO 0.1 cm2, RV 20 mL.  Stress echo 09/19/2020: 1. This is a negative stress echocardiogram for ischemia.  2. This is a low risk study.  )        Anesthesia Quick Evaluation

## 2022-11-18 NOTE — Anesthesia Procedure Notes (Addendum)
Procedure Name: Intubation Date/Time: December 01, 2022 10:36 AM  Performed by: Darletta Moll, CRNAPre-anesthesia Checklist: Patient identified, Emergency Drugs available, Suction available and Patient being monitored Patient Re-evaluated:Patient Re-evaluated prior to induction Oxygen Delivery Method: Circle system utilized Preoxygenation: Pre-oxygenation with 100% oxygen Induction Type: IV induction LMA: LMA flexible inserted LMA Size: 4.0 Number of attempts: 1 Placement Confirmation: positive ETCO2, breath sounds checked- equal and bilateral and CO2 detector Tube secured with: Tape Dental Injury: Teeth and Oropharynx as per pre-operative assessment  Comments: Performed by Mave Paramedic student and Dr. Royce Macadamia

## 2022-11-18 NOTE — Op Note (Signed)
11/19/22  11:10 AM  PATIENT:  Edward [name]    PRE-OPERATIVE DIAGNOSIS:  Dehiscence Left Above Knee Amputation  POST-OPERATIVE DIAGNOSIS:  Same  PROCEDURE:  REVISION LEFT ABOVE KNEE AMPUTATION  SURGEON:  [name], MD  PHYSICIAN ASSISTANT: Franz Dell ANESTHESIA:   General  PREOPERATIVE INDICATIONS:  HOPE FLAX is a  52 y.o. male with a diagnosis of Dehiscence Left Above Knee Amputation who failed conservative measures and elected for surgical management.    The risks benefits and alternatives were discussed with the patient preoperatively including but not limited to the risks of infection, bleeding, nerve injury, cardiopulmonary complications, the need for revision surgery, among others, and the patient was willing to proceed.  OPERATIVE IMPLANTS: None  @ENCIMAGES @  OPERATIVE FINDINGS: Patient had ischemic muscles at the level of amputation.  Patient went into A-fib after wound closure.  Patient underwent chest compressions and reestablished a heart rate.  Plan for discharge to the ICU with ICU management.  OPERATIVE PROCEDURE: Patient was brought the operating room underwent general anesthetic.  After adequate levels anesthesia obtained patient's left lower extremity was prepped using DuraPrep draped into a sterile field a timeout was called.  A fishmouth incision was made proximal to the necrotic ulcerative tissue.  The amputation was revised the femur was transected with a reciprocating saw.  Wound margins were clear the wound was irrigated with normal saline the vascular bundles were suture-ligated with 2-0 silk.  Th skin was closed using nylon and staples.  While applying the dressing, a wound VAC dressing patient developed a cardiac arrhythmia with pulseless electrical activity.  Patient underwent chest compressions received epinephrine and a pulse was reestablished.  Patient was then intubated and continued with epinephrine.  Blood was drawn and electrolytes were  stable.  Patient developed pulseless electrical activity for the second time he underwent chest compressions repeat epinephrine bolus.  A pulse was established.  Patient underwent a third episode of chest compressions to restore pulse.  With repeat boluses of epinephrine.  A transesophageal echo showed no motion of the ventricles.  Patient had pulseless electrical activity without cardiac contractility..  With the lack of heart ventricular motion, lack of femoral, radial, carotid pulses, patient was declared deceased at 11:45.   DISCHARGE PLANNING:   Discharge to: [facility]

## 2022-11-18 DEATH — deceased
